# Patient Record
Sex: Male | Born: 1980
Health system: Southern US, Community
[De-identification: ages and names within clinical notes are randomized; demographics above are authoritative.]

## PROBLEM LIST (undated history)

## (undated) ENCOUNTER — Ambulatory Visit (HOSPITAL_COMMUNITY): Admission: EM | Payer: Medicare Other

## (undated) DIAGNOSIS — D571 Sickle-cell disease without crisis: Secondary | ICD-10-CM

## (undated) DIAGNOSIS — G43909 Migraine, unspecified, not intractable, without status migrainosus: Secondary | ICD-10-CM

## (undated) DIAGNOSIS — N058 Unspecified nephritic syndrome with other morphologic changes: Secondary | ICD-10-CM

## (undated) DIAGNOSIS — K3533 Acute appendicitis with perforation and localized peritonitis, with abscess: Secondary | ICD-10-CM

## (undated) DIAGNOSIS — R000 Tachycardia, unspecified: Secondary | ICD-10-CM

## (undated) DIAGNOSIS — J159 Unspecified bacterial pneumonia: Secondary | ICD-10-CM

## (undated) DIAGNOSIS — G930 Cerebral cysts: Secondary | ICD-10-CM

## (undated) DIAGNOSIS — K529 Noninfective gastroenteritis and colitis, unspecified: Secondary | ICD-10-CM

## (undated) DIAGNOSIS — N189 Chronic kidney disease, unspecified: Secondary | ICD-10-CM

## (undated) DIAGNOSIS — N289 Disorder of kidney and ureter, unspecified: Secondary | ICD-10-CM

## (undated) DIAGNOSIS — N08 Glomerular disorders in diseases classified elsewhere: Secondary | ICD-10-CM

## (undated) DIAGNOSIS — D5700 Hb-SS disease with crisis, unspecified: Secondary | ICD-10-CM

## (undated) DIAGNOSIS — N182 Chronic kidney disease, stage 2 (mild): Secondary | ICD-10-CM

## (undated) DIAGNOSIS — N62 Hypertrophy of breast: Secondary | ICD-10-CM

## (undated) DIAGNOSIS — K219 Gastro-esophageal reflux disease without esophagitis: Secondary | ICD-10-CM

## (undated) DIAGNOSIS — Z9289 Personal history of other medical treatment: Secondary | ICD-10-CM

## (undated) DIAGNOSIS — F1123 Opioid dependence with withdrawal: Secondary | ICD-10-CM

## (undated) HISTORY — PX: CHOLECYSTECTOMY: SHX55

## (undated) HISTORY — DX: Unspecified bacterial pneumonia: J15.9

## (undated) HISTORY — DX: Sickle-cell disease without crisis: D57.1

## (undated) HISTORY — PX: ABCESS DRAINAGE: SHX399

## (undated) HISTORY — PX: OTHER SURGICAL HISTORY: SHX169

---
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
## 2002-03-13 ENCOUNTER — Encounter: Admission: RE | Admit: 2002-03-13 | Discharge: 2002-03-13 | Payer: Self-pay | Admitting: Family Medicine

## 2002-03-15 ENCOUNTER — Inpatient Hospital Stay (HOSPITAL_COMMUNITY): Admission: EM | Admit: 2002-03-15 | Discharge: 2002-03-19 | Payer: Self-pay | Admitting: Emergency Medicine

## 2002-03-15 ENCOUNTER — Encounter: Payer: Self-pay | Admitting: Emergency Medicine

## 2002-03-17 ENCOUNTER — Encounter: Payer: Self-pay | Admitting: Family Medicine

## 2002-03-25 ENCOUNTER — Encounter: Admission: RE | Admit: 2002-03-25 | Discharge: 2002-03-25 | Payer: Self-pay | Admitting: Family Medicine

## 2002-08-15 ENCOUNTER — Encounter: Admission: RE | Admit: 2002-08-15 | Discharge: 2002-08-15 | Payer: Self-pay | Admitting: Family Medicine

## 2002-09-14 ENCOUNTER — Inpatient Hospital Stay (HOSPITAL_COMMUNITY): Admission: EM | Admit: 2002-09-14 | Discharge: 2002-09-16 | Payer: Self-pay | Admitting: Emergency Medicine

## 2003-01-14 ENCOUNTER — Inpatient Hospital Stay (HOSPITAL_COMMUNITY): Admission: EM | Admit: 2003-01-14 | Discharge: 2003-01-19 | Payer: Self-pay | Admitting: Emergency Medicine

## 2003-03-10 ENCOUNTER — Observation Stay (HOSPITAL_COMMUNITY): Admission: EM | Admit: 2003-03-10 | Discharge: 2003-03-11 | Payer: Self-pay | Admitting: Emergency Medicine

## 2003-03-14 ENCOUNTER — Inpatient Hospital Stay (HOSPITAL_COMMUNITY): Admission: EM | Admit: 2003-03-14 | Discharge: 2003-03-16 | Payer: Self-pay

## 2003-04-06 ENCOUNTER — Encounter: Admission: RE | Admit: 2003-04-06 | Discharge: 2003-04-06 | Payer: Self-pay | Admitting: Family Medicine

## 2003-07-17 ENCOUNTER — Encounter: Admission: RE | Admit: 2003-07-17 | Discharge: 2003-07-17 | Payer: Self-pay | Admitting: Sports Medicine

## 2003-07-20 ENCOUNTER — Encounter: Admission: RE | Admit: 2003-07-20 | Discharge: 2003-07-20 | Payer: Self-pay | Admitting: Sports Medicine

## 2003-07-24 ENCOUNTER — Emergency Department (HOSPITAL_COMMUNITY): Admission: EM | Admit: 2003-07-24 | Discharge: 2003-07-24 | Payer: Self-pay | Admitting: Emergency Medicine

## 2003-07-24 ENCOUNTER — Encounter: Admission: RE | Admit: 2003-07-24 | Discharge: 2003-07-24 | Payer: Self-pay | Admitting: Sports Medicine

## 2003-08-11 ENCOUNTER — Encounter: Admission: RE | Admit: 2003-08-11 | Discharge: 2003-08-11 | Payer: Self-pay | Admitting: Family Medicine

## 2003-10-27 ENCOUNTER — Ambulatory Visit: Payer: Self-pay | Admitting: Family Medicine

## 2003-10-30 ENCOUNTER — Ambulatory Visit: Payer: Self-pay | Admitting: Family Medicine

## 2004-01-18 ENCOUNTER — Emergency Department (HOSPITAL_COMMUNITY): Admission: EM | Admit: 2004-01-18 | Discharge: 2004-01-19 | Payer: Self-pay | Admitting: Emergency Medicine

## 2004-01-20 ENCOUNTER — Ambulatory Visit: Payer: Self-pay | Admitting: Family Medicine

## 2004-01-20 ENCOUNTER — Inpatient Hospital Stay (HOSPITAL_COMMUNITY): Admission: EM | Admit: 2004-01-20 | Discharge: 2004-01-23 | Payer: Self-pay | Admitting: Emergency Medicine

## 2004-03-03 ENCOUNTER — Ambulatory Visit: Payer: Self-pay | Admitting: Family Medicine

## 2004-03-03 ENCOUNTER — Emergency Department (HOSPITAL_COMMUNITY): Admission: EM | Admit: 2004-03-03 | Discharge: 2004-03-03 | Payer: Self-pay | Admitting: Physical Therapy

## 2004-05-06 ENCOUNTER — Ambulatory Visit: Payer: Self-pay | Admitting: Sports Medicine

## 2004-05-11 ENCOUNTER — Ambulatory Visit: Payer: Self-pay | Admitting: Family Medicine

## 2004-05-11 ENCOUNTER — Emergency Department (HOSPITAL_COMMUNITY): Admission: EM | Admit: 2004-05-11 | Discharge: 2004-05-11 | Payer: Self-pay | Admitting: Emergency Medicine

## 2004-05-20 ENCOUNTER — Encounter: Admission: RE | Admit: 2004-05-20 | Discharge: 2004-05-20 | Payer: Self-pay | Admitting: Sports Medicine

## 2004-05-20 ENCOUNTER — Ambulatory Visit: Payer: Self-pay | Admitting: Sports Medicine

## 2004-06-08 ENCOUNTER — Ambulatory Visit: Payer: Self-pay | Admitting: Family Medicine

## 2004-08-15 ENCOUNTER — Ambulatory Visit: Payer: Self-pay | Admitting: Family Medicine

## 2004-09-20 ENCOUNTER — Ambulatory Visit: Payer: Self-pay | Admitting: Family Medicine

## 2004-09-27 ENCOUNTER — Ambulatory Visit: Payer: Self-pay | Admitting: Family Medicine

## 2004-09-27 ENCOUNTER — Inpatient Hospital Stay (HOSPITAL_COMMUNITY): Admission: AD | Admit: 2004-09-27 | Discharge: 2004-09-29 | Payer: Self-pay | Admitting: Family Medicine

## 2004-10-02 ENCOUNTER — Emergency Department (HOSPITAL_COMMUNITY): Admission: EM | Admit: 2004-10-02 | Discharge: 2004-10-02 | Payer: Self-pay | Admitting: Family Medicine

## 2004-10-13 ENCOUNTER — Ambulatory Visit: Payer: Self-pay | Admitting: Family Medicine

## 2004-12-24 IMAGING — US US ABDOMEN COMPLETE
1 series · 14 of 25 positions shown · non-contrast
Comparison: none

CLINICAL DATA: Right upper quadrant pain.  Sickle cell.
ABDOMINAL ULTRASOUND, COMPLETE 
Comparison 05/24/00.  There are multiple echogenic, movable foci within the gallbladder lumen.  Most of these do not shadow and are probably due to tumefactive sludge.  There are a few shadowing foci as well compatible with gallstones.  There is no gallbladder wall thickening. No pericholecystic fluid is present, and the patient is nontender on imaging the gallbladder.
Views of the liver, inferior vena cava, and kidneys are unremarkable.  The right kidney measures 10.9 cm in length and the left kidney is 9.8 cm.  The pancreas and spleen are poorly seen due to intestinal bowel gas.  There is somewhat limited evaluation of the left kidney as well.  The abdominal aorta does not appear dilated.  There is no ascites. 
IMPRESSION
1.  As noted previously, there is gallbladder sludge, and small gallstones are present on the current exam.  o evidence of cholecystitis.
2.  No other abnormalities are demonstrated.  The spleen and pancreas are poorly seen secondary to bowel gas.

[Series 1: unknown · 0.27mm/px · 14 of 84 slices shown]
[im 1/84]
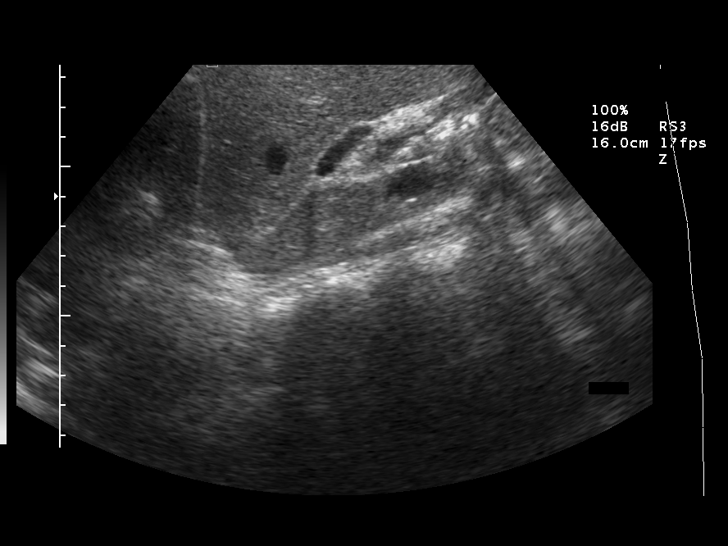
[im 7/84]
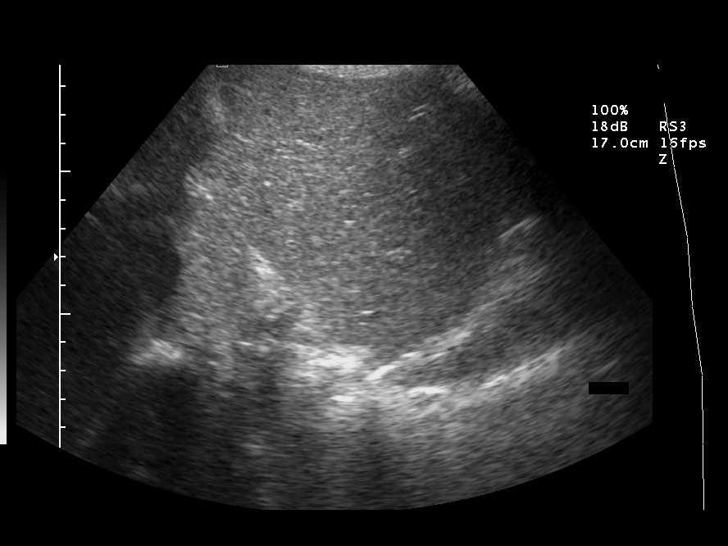
[im 14/84]
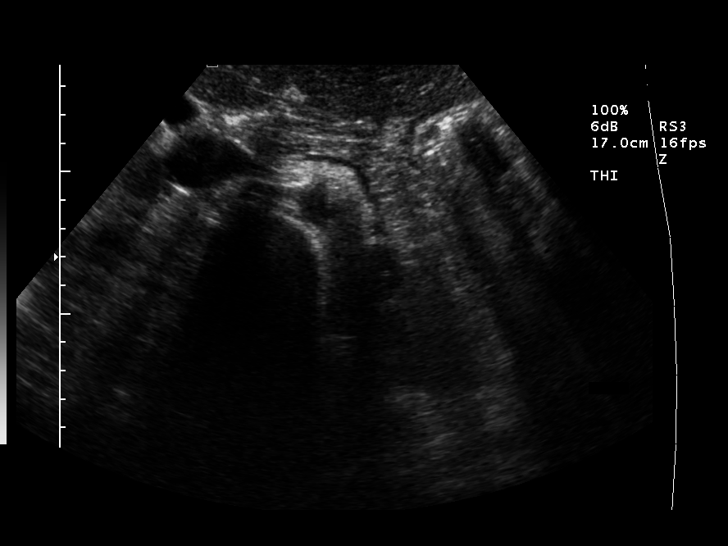
[im 21/84]
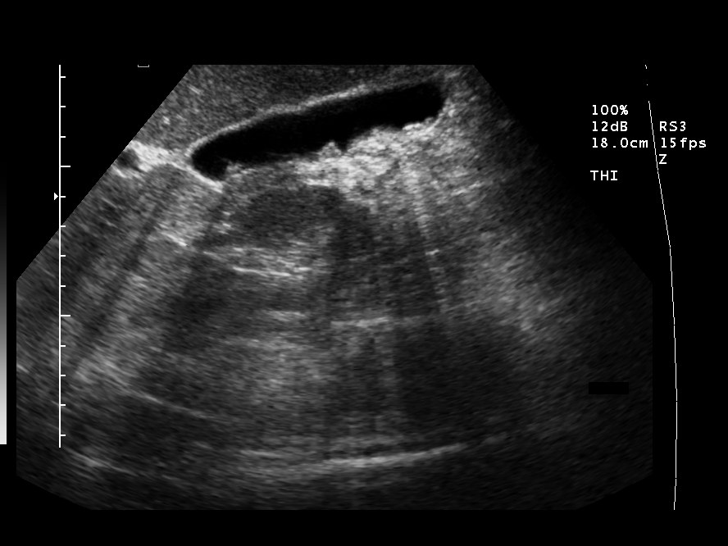
[im 28/84]
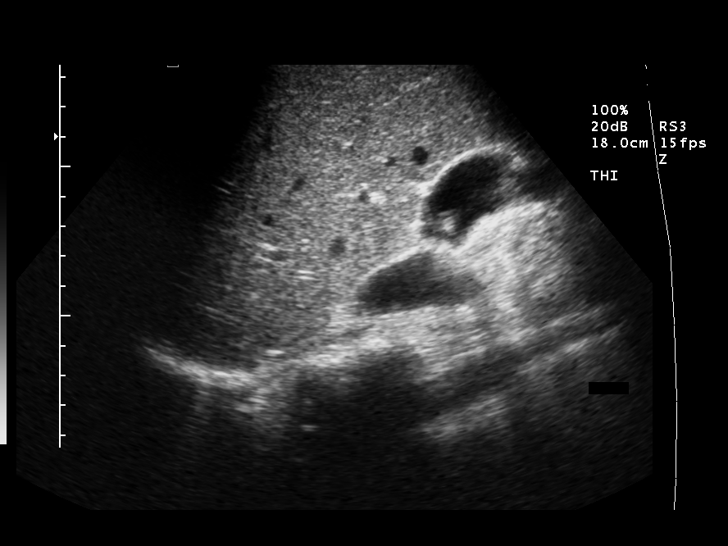
[im 32/84]
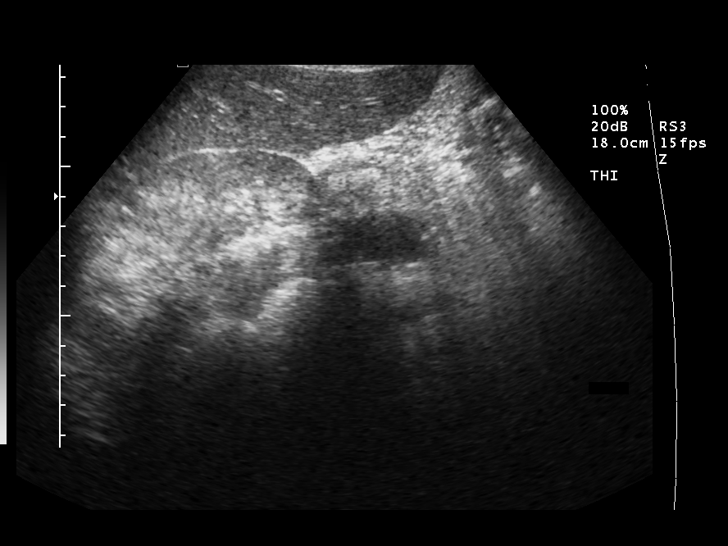
[im 39/84]
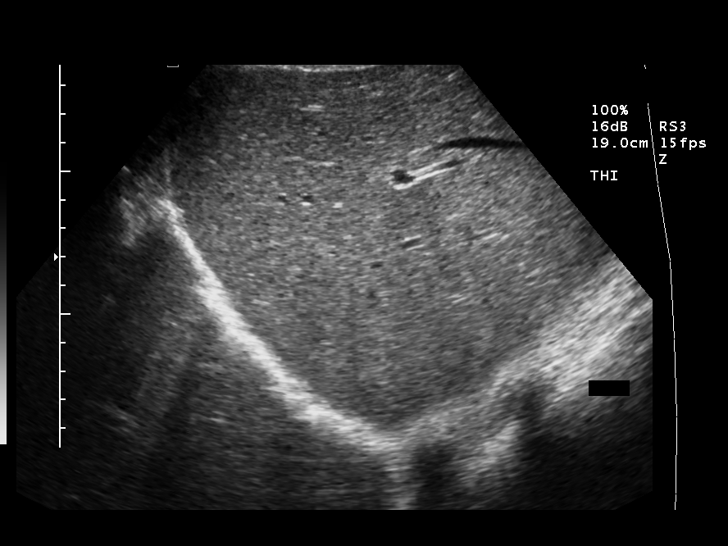
[im 45/84]
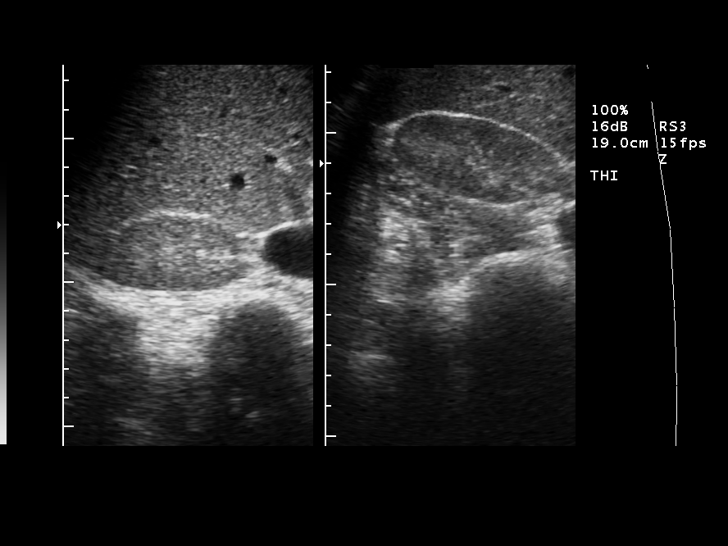
[im 52/84]
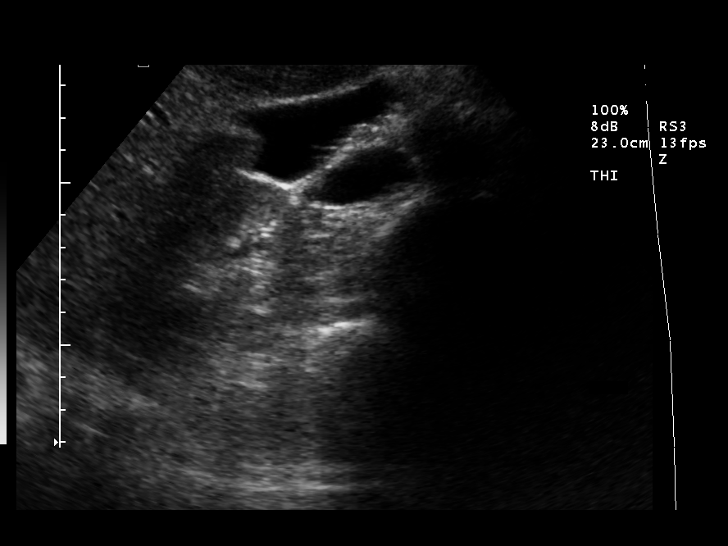
[im 56/84]
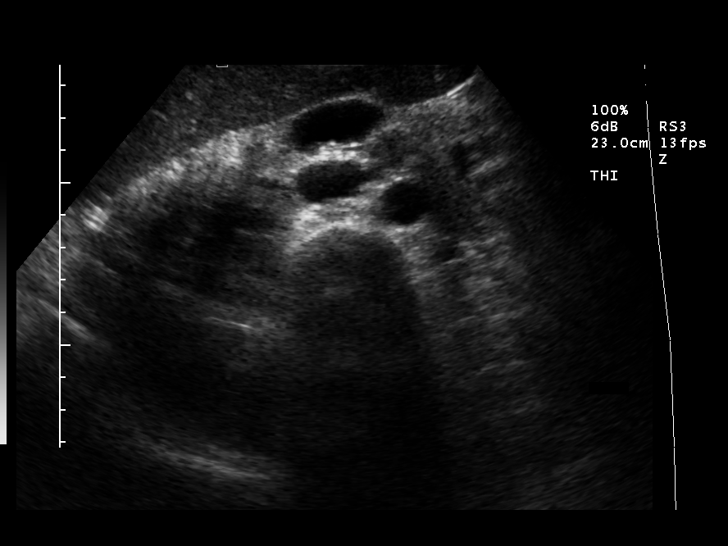
[im 63/84]
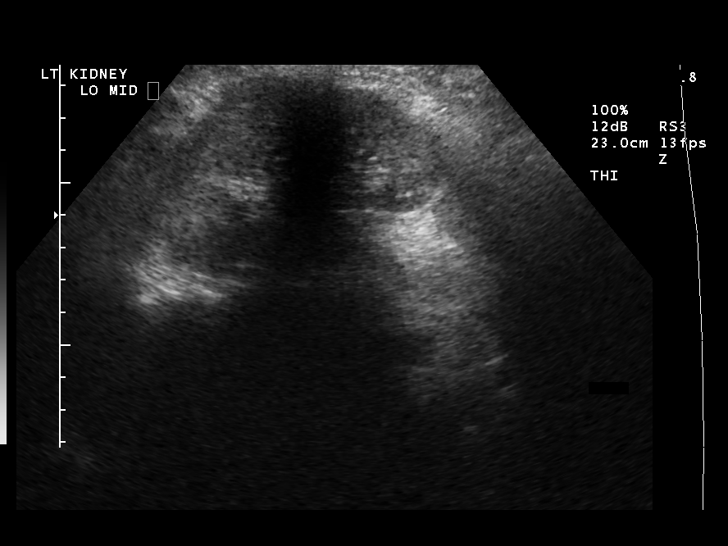
[im 70/84]
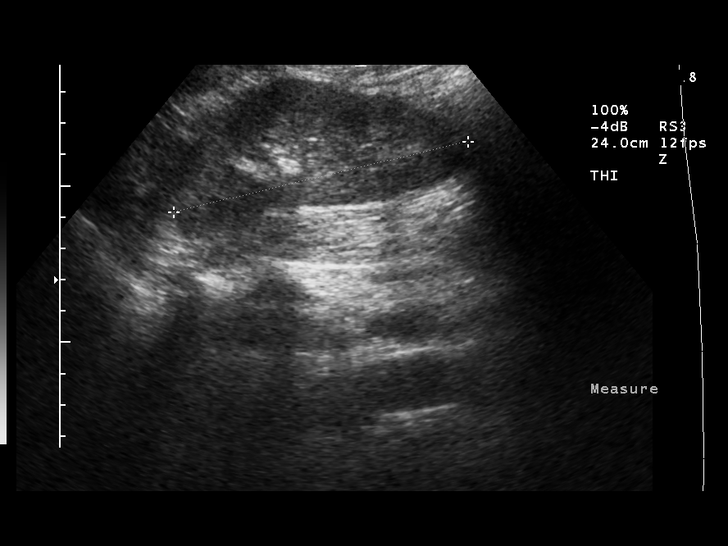
[im 77/84]
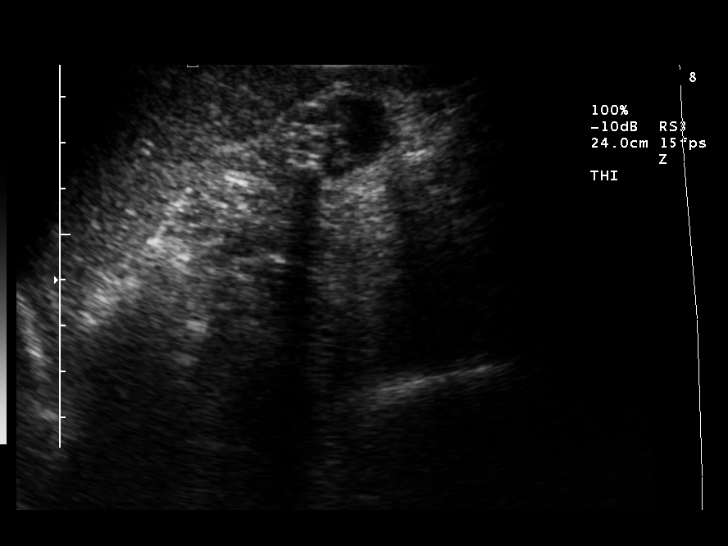
[im 84/84]
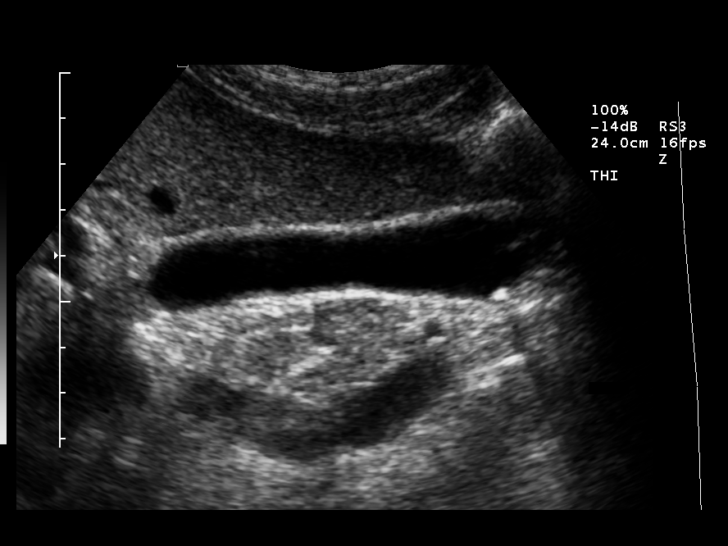

[14 of 25 positions shown; findings below may reference images not displayed]

## 2004-12-31 ENCOUNTER — Emergency Department (HOSPITAL_COMMUNITY): Admission: EM | Admit: 2004-12-31 | Discharge: 2004-12-31 | Payer: Self-pay | Admitting: Family Medicine

## 2005-01-16 ENCOUNTER — Ambulatory Visit: Payer: Self-pay | Admitting: Family Medicine

## 2005-03-03 ENCOUNTER — Ambulatory Visit: Payer: Self-pay | Admitting: Family Medicine

## 2005-06-25 IMAGING — CR DG CHEST 2V
2 series · 2 of 2 positions shown · non-contrast
Comparison: none

CLINICAL DATA: Sickle cell disease.  Dyspnea and chest pain. 
 CHEST - TWO VIEW:
 Cardiomegaly and increased vascularity noted.  Diffuse peribronchial thickening and interstitial opacities are likely chronic.  Mild airspace disease in the left lower lung noted and pneumonia not excluded.  Bony evidence of sickle cell disease identified.

[view not recorded (1 of 2)]
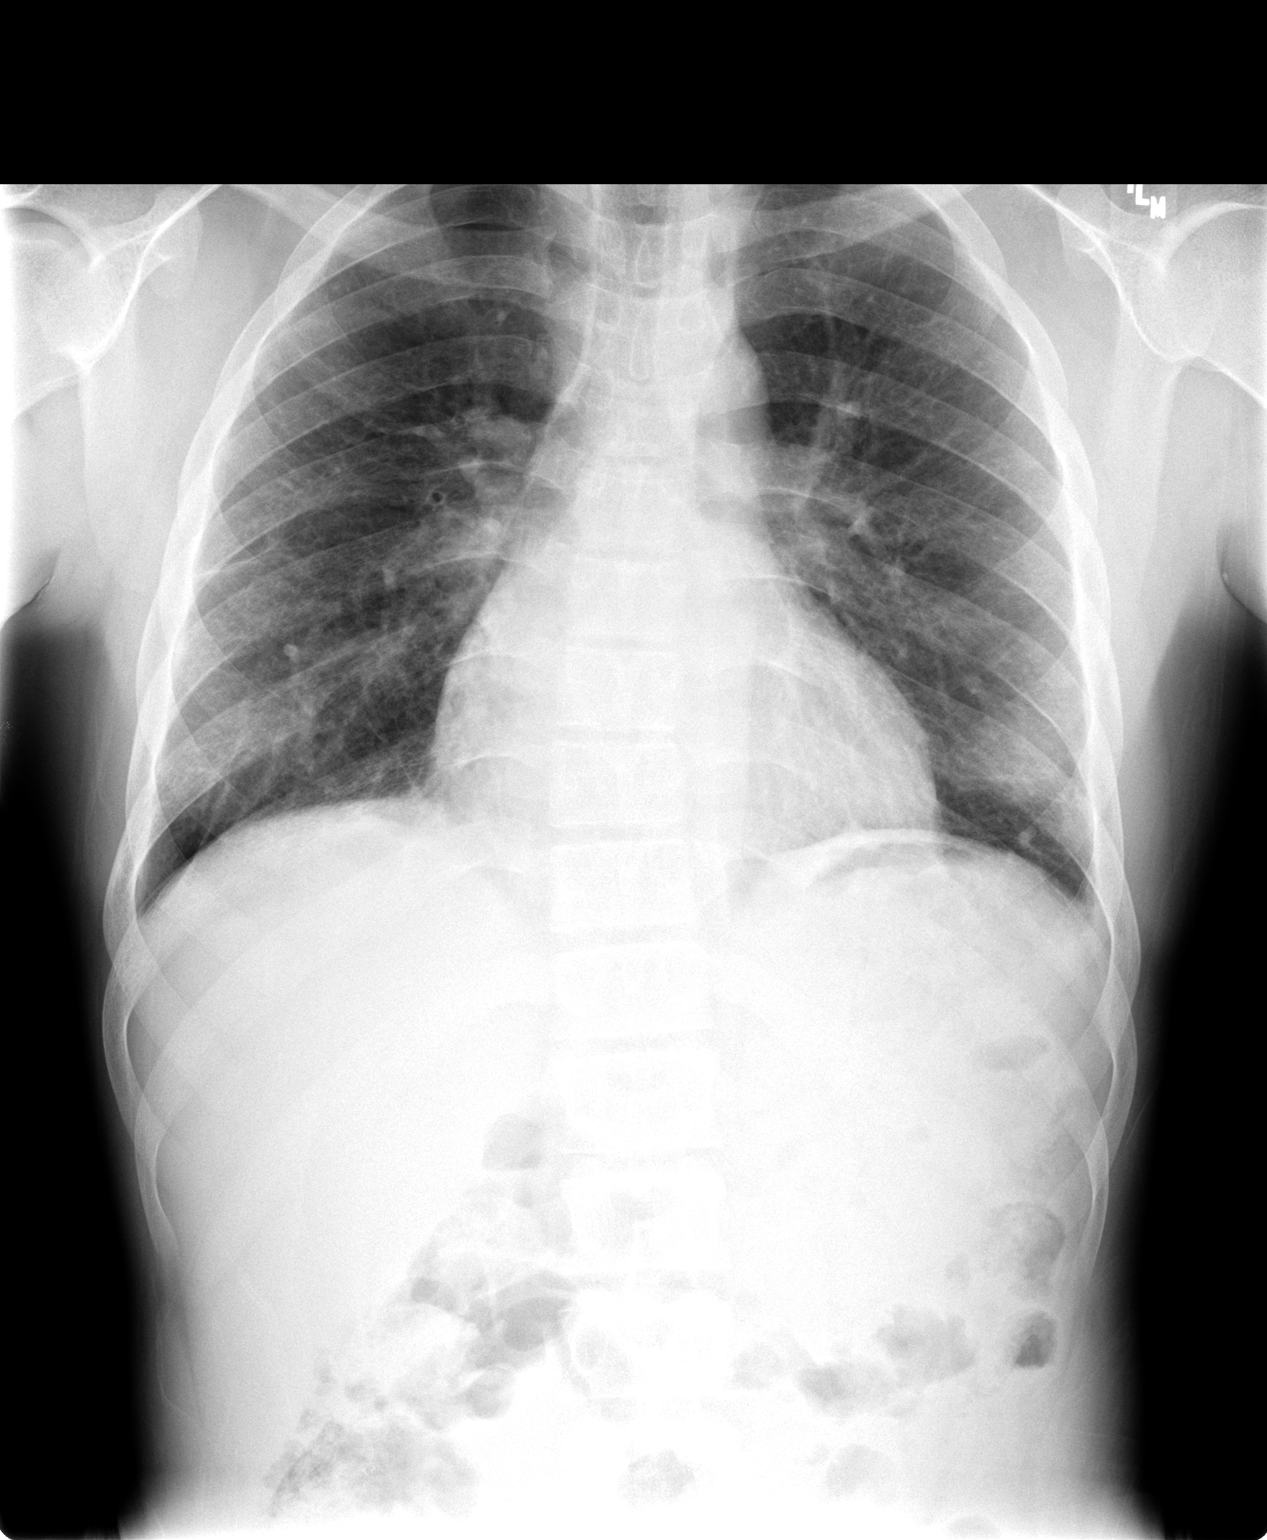

[view not recorded (2 of 2)]
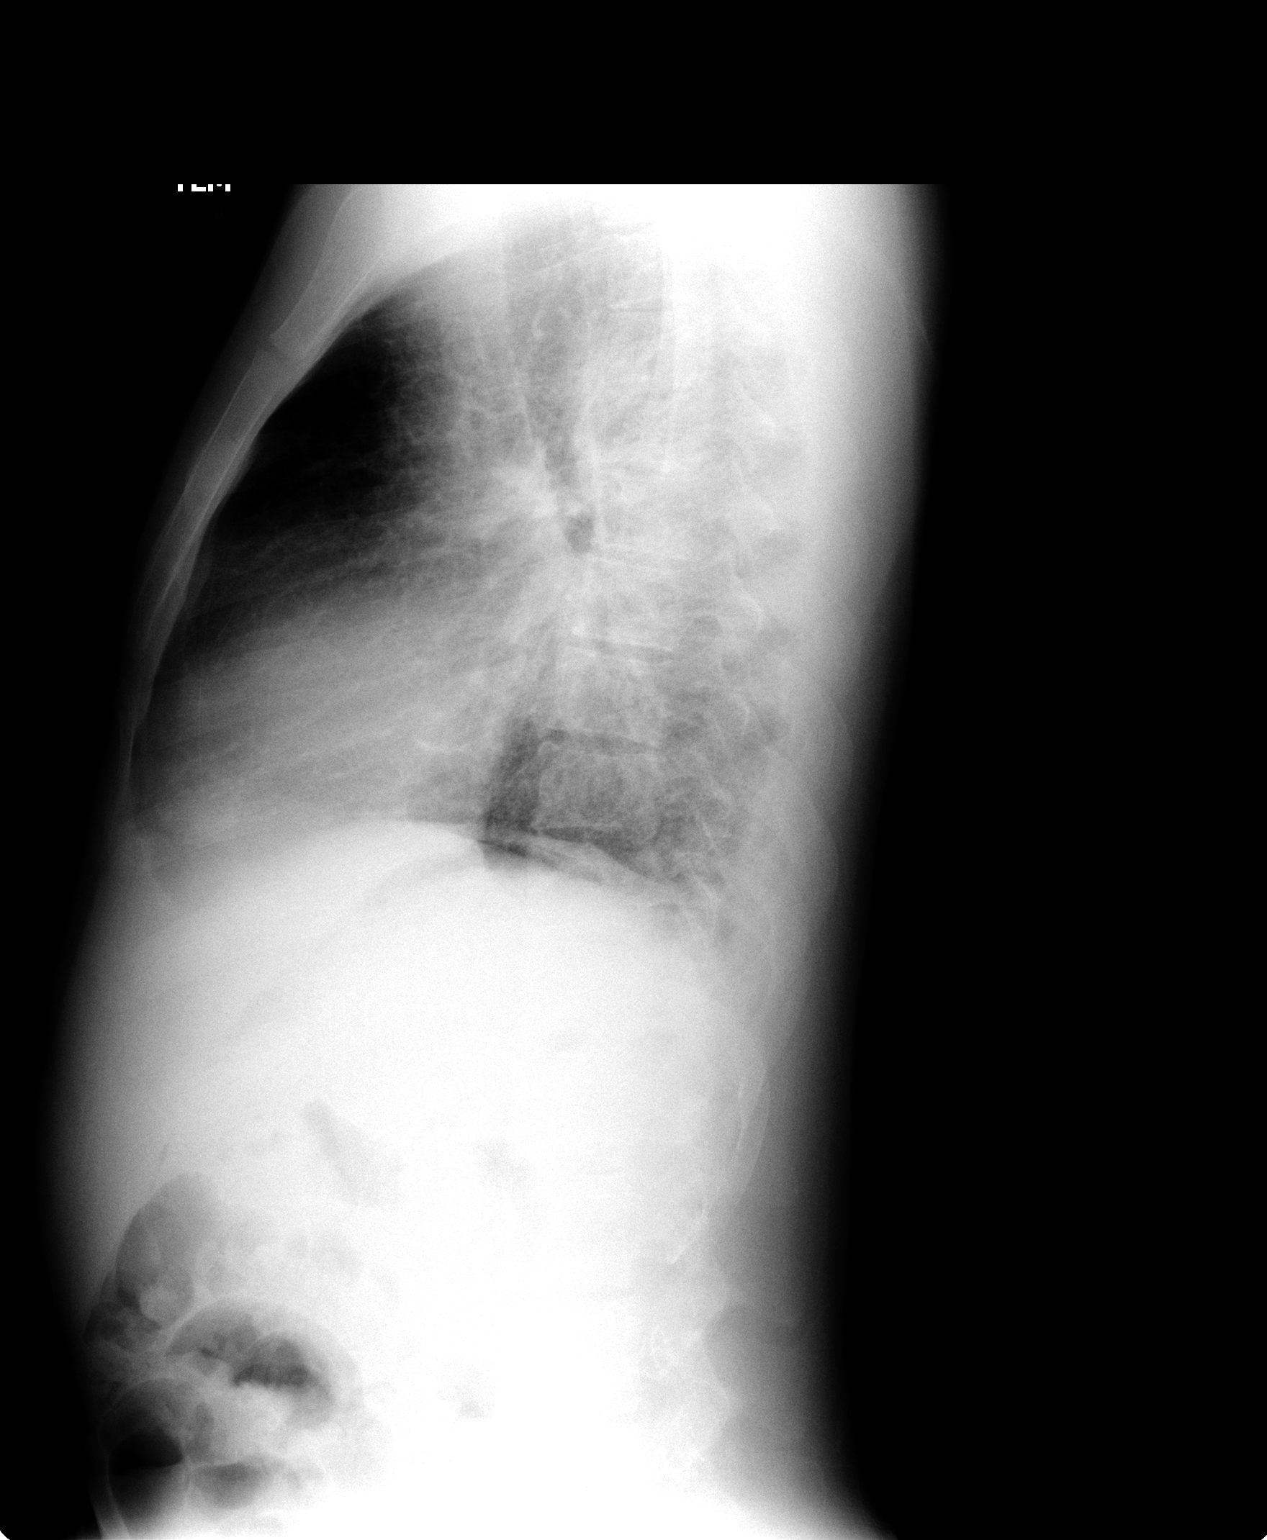

[2 of 2 positions shown; findings below may reference images not displayed]

IMPRESSION: 1.  Cardiomegaly and increased pulmonary vascularity.  Chronic interstitial changes. 
 2.  Question mild airspace disease/pneumonia in the left lower lung.

## 2005-06-26 ENCOUNTER — Ambulatory Visit: Payer: Self-pay | Admitting: Family Medicine

## 2005-06-27 IMAGING — CR DG CHEST 2V
2 series · 2 of 2 positions shown · non-contrast
Comparison: none

CLINICAL DATA: Sickle cell crisis, chest pain, worse with deep inspiration.
 CHEST, TWO VIEWS:
 PA and lateral views of the chest are made and are compared to previous studies of 01/19/04 and show worsening of the basilar atelectasis and/or infiltrates bilaterally.  The changes are more marked on the left than the right.  There is a generalized peribronchial thickening, and the cardiopericardial silhouette is somewhat prominent as would be expected with the patient?s sickle cell anemia.

[view not recorded (1 of 2)]
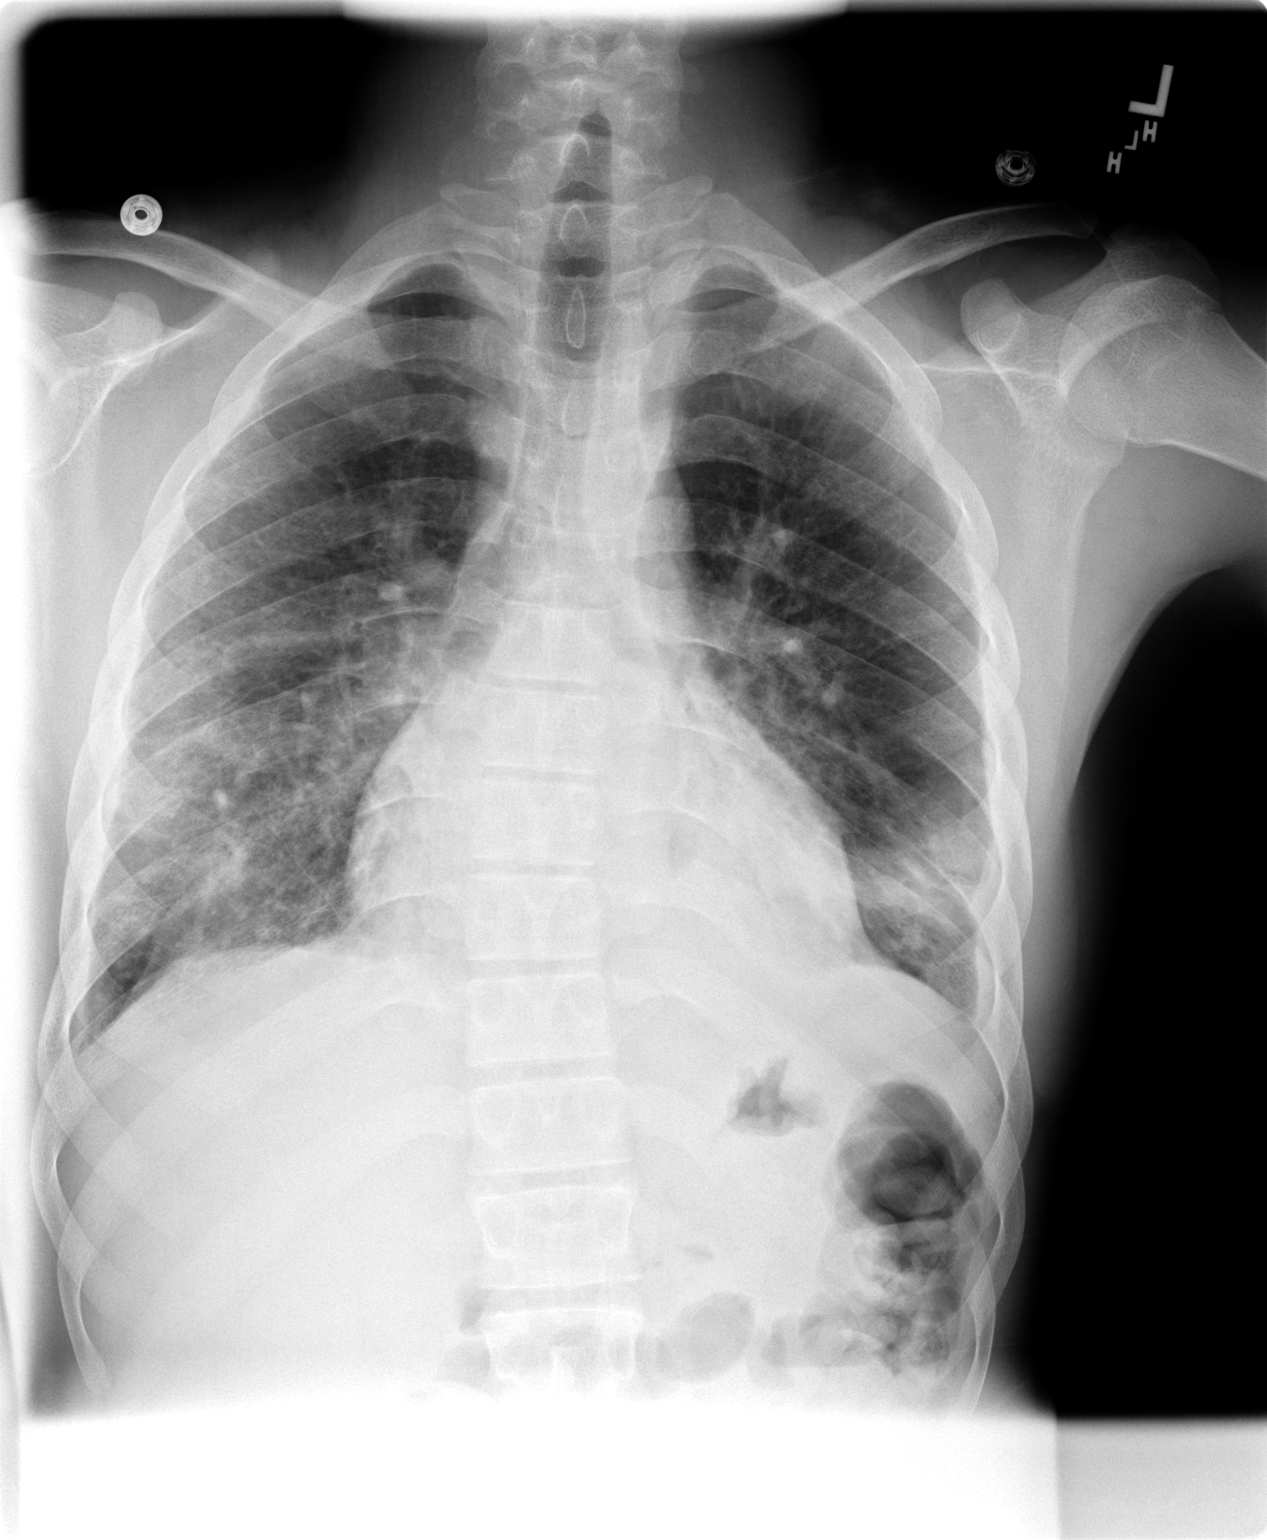

[view not recorded (2 of 2)]
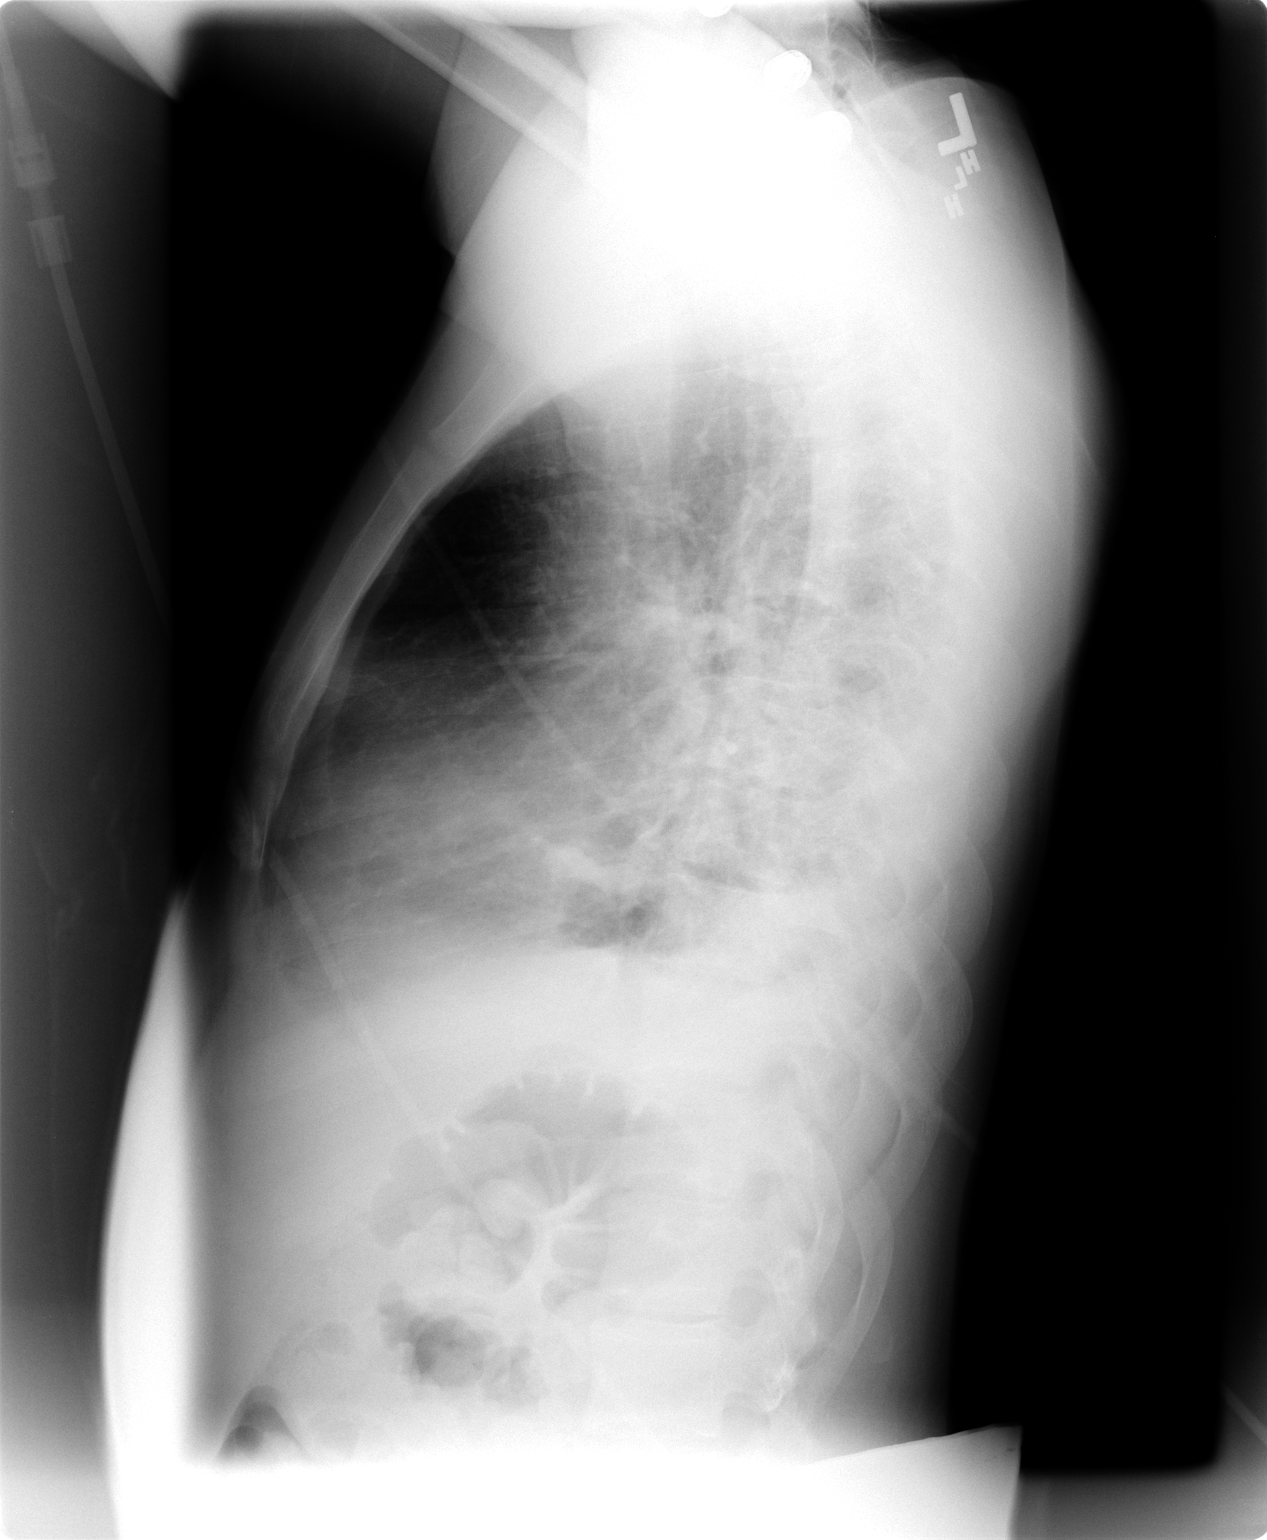

[2 of 2 positions shown; findings below may reference images not displayed]

IMPRESSION: Interval worsening of basilar aeration.  Cannot rule out the possibility of an early infiltrate at the left base.  There also is now a small amount of left pleural effusion that has developed in the interval.

## 2005-08-29 ENCOUNTER — Ambulatory Visit: Payer: Self-pay | Admitting: Family Medicine

## 2005-09-16 ENCOUNTER — Ambulatory Visit: Payer: Self-pay | Admitting: Family Medicine

## 2005-09-16 ENCOUNTER — Inpatient Hospital Stay (HOSPITAL_COMMUNITY): Admission: AD | Admit: 2005-09-16 | Discharge: 2005-09-18 | Payer: Self-pay | Admitting: Family Medicine

## 2005-09-16 ENCOUNTER — Emergency Department (HOSPITAL_COMMUNITY): Admission: EM | Admit: 2005-09-16 | Discharge: 2005-09-16 | Payer: Self-pay | Admitting: Family Medicine

## 2005-10-25 IMAGING — CR DG CHEST 2V
2 series · 2 of 2 positions shown · non-contrast
Comparison: none

CLINICAL DATA: Right sided chest pain.  Short of breath. 
 CHEST X-RAY: 
 Two views of the chest are compared to a chest x-ray of 01/21/04.  There is a patchy opacity peripherally at the left lung base.  This could represent scarring at the site of prior pneumonia, but a small patchy area of pneumonia currently is difficult to exclude.  Follow up chest x-ray is recommended.  Changes of bronchitis are noted.  The heart is mildly enlarged and stable.

[view not recorded (1 of 2)]
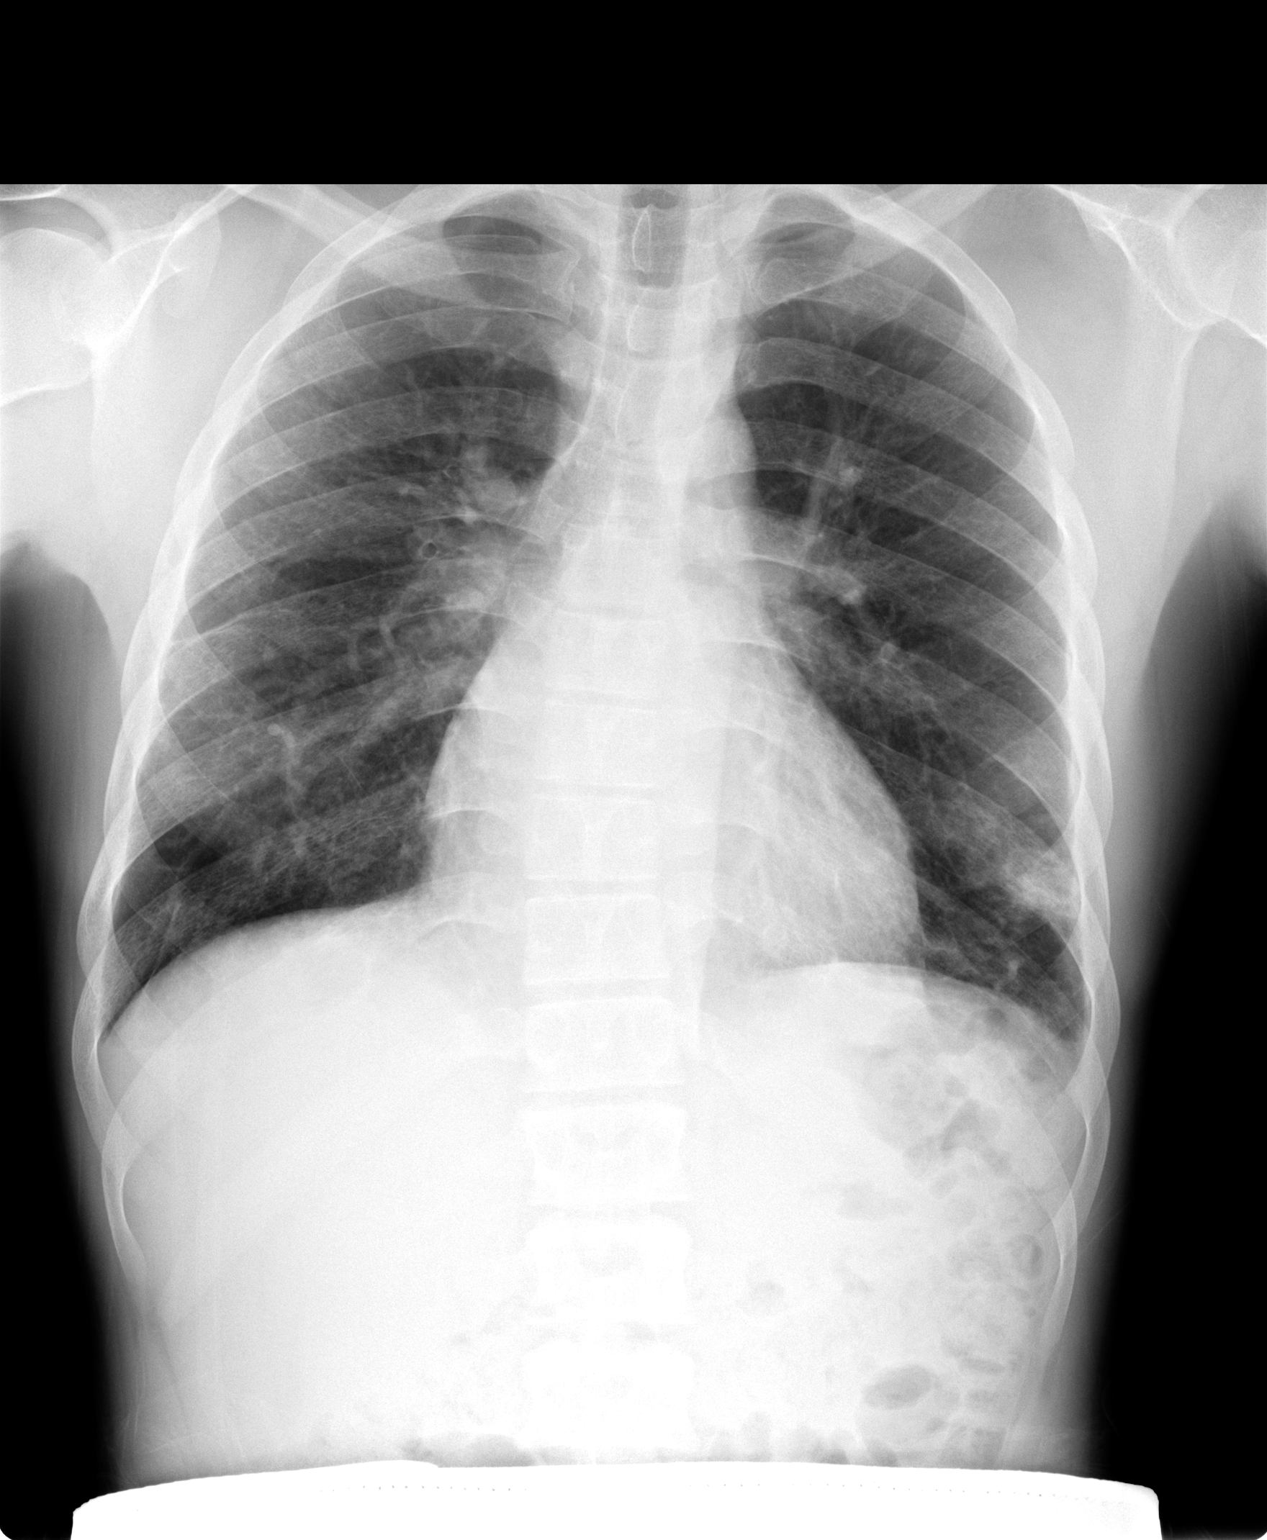

[view not recorded (2 of 2)]
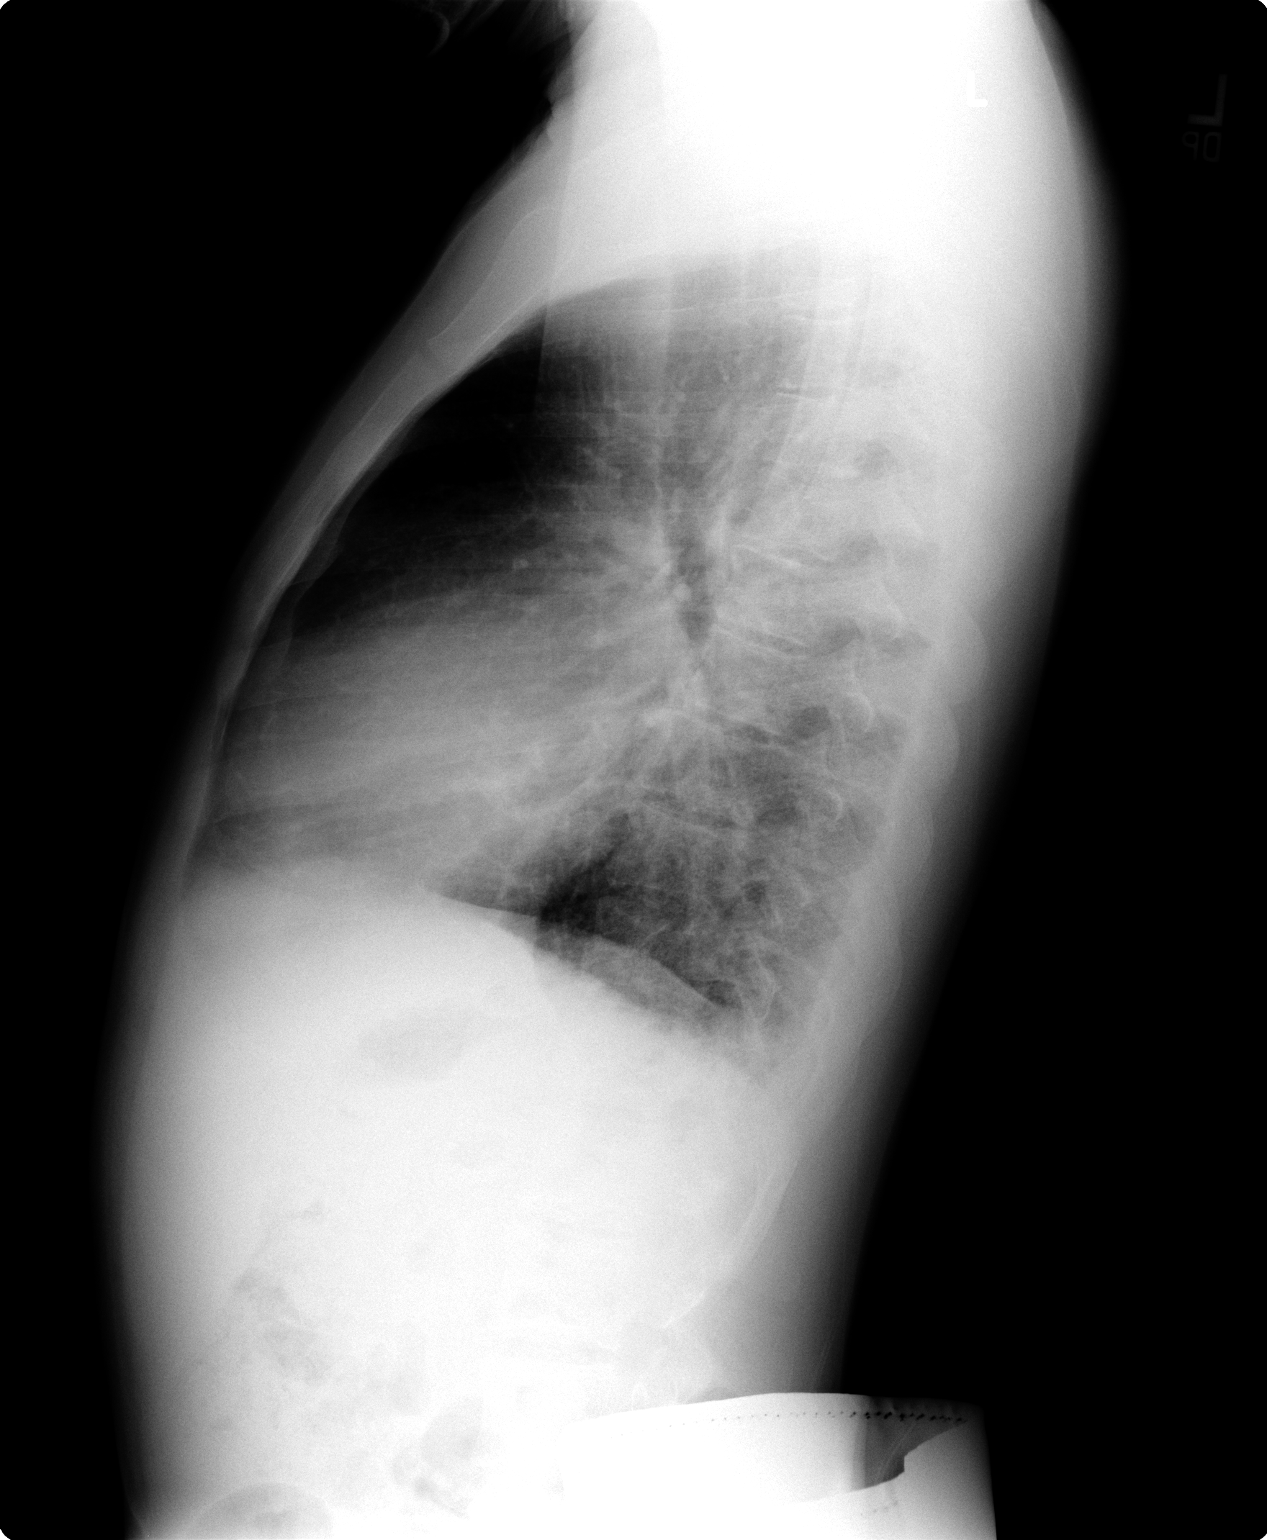

[2 of 2 positions shown; findings below may reference images not displayed]

IMPRESSION: 1.  Cannot exclude small patchy area of pneumonia at the periphery of the left lung base vs scarring.  Suggest follow up. 
 2.  Changes of bronchitis.

## 2005-11-08 ENCOUNTER — Ambulatory Visit: Payer: Self-pay | Admitting: Family Medicine

## 2005-11-09 ENCOUNTER — Ambulatory Visit: Payer: Self-pay | Admitting: Family Medicine

## 2005-11-09 ENCOUNTER — Inpatient Hospital Stay (HOSPITAL_COMMUNITY): Admission: EM | Admit: 2005-11-09 | Discharge: 2005-11-14 | Payer: Self-pay | Admitting: Emergency Medicine

## 2005-11-10 ENCOUNTER — Ambulatory Visit: Payer: Self-pay | Admitting: Oncology

## 2005-11-23 ENCOUNTER — Ambulatory Visit: Payer: Self-pay | Admitting: Family Medicine

## 2005-11-24 ENCOUNTER — Ambulatory Visit: Payer: Self-pay | Admitting: Family Medicine

## 2006-02-13 ENCOUNTER — Inpatient Hospital Stay (HOSPITAL_COMMUNITY): Admission: EM | Admit: 2006-02-13 | Discharge: 2006-02-14 | Payer: Self-pay | Admitting: Emergency Medicine

## 2006-02-13 ENCOUNTER — Ambulatory Visit: Payer: Self-pay | Admitting: Family Medicine

## 2006-03-04 IMAGING — CR DG CHEST 2V
2 series · 2 of 2 positions shown · non-contrast
Comparison: 01/19/04 and 05/20/04.

CLINICAL DATA: Chest pain and shortness of breath.
 CHEST - 2 VIEW: 
 PA and lateral chest ? 09/27/04.

[w chest pa]
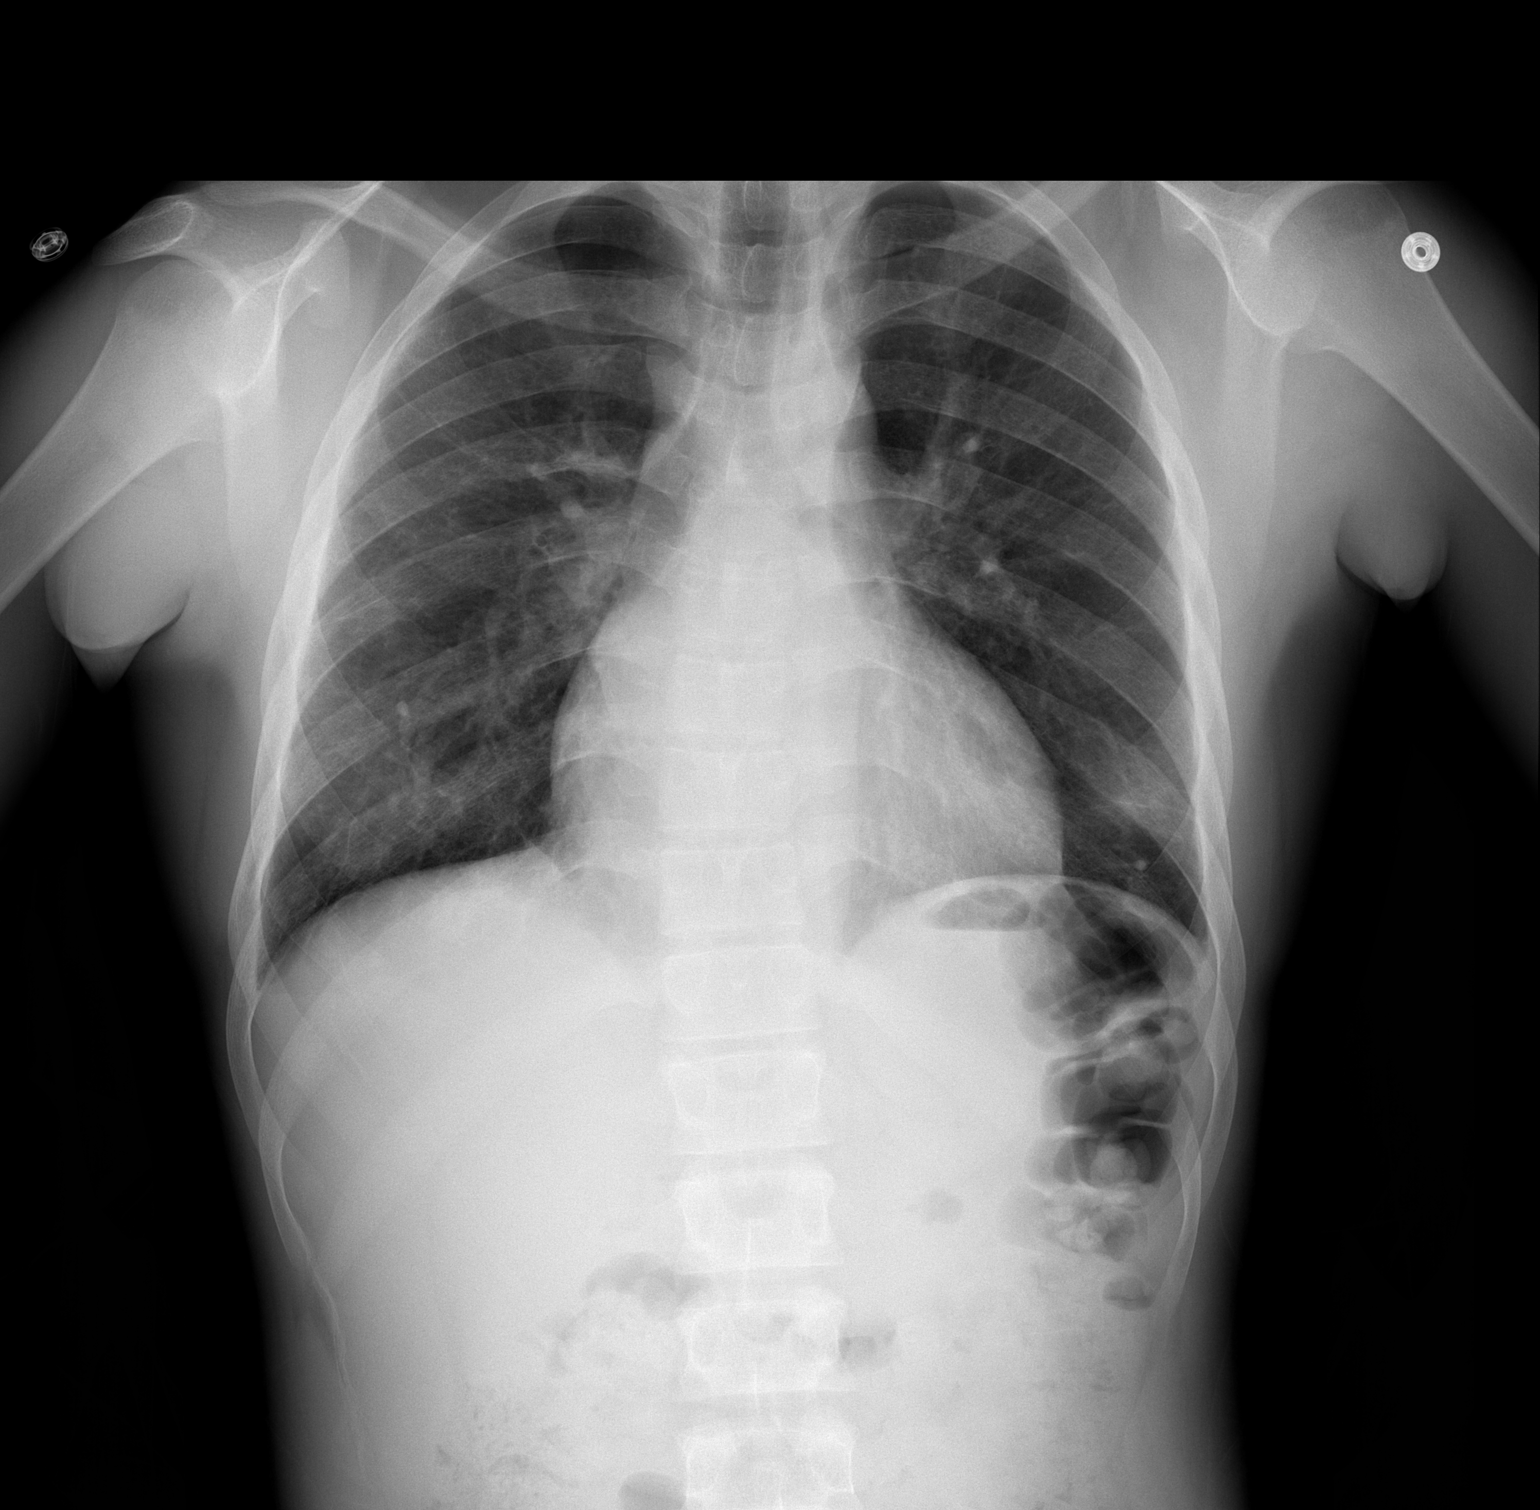

[w chest lat]
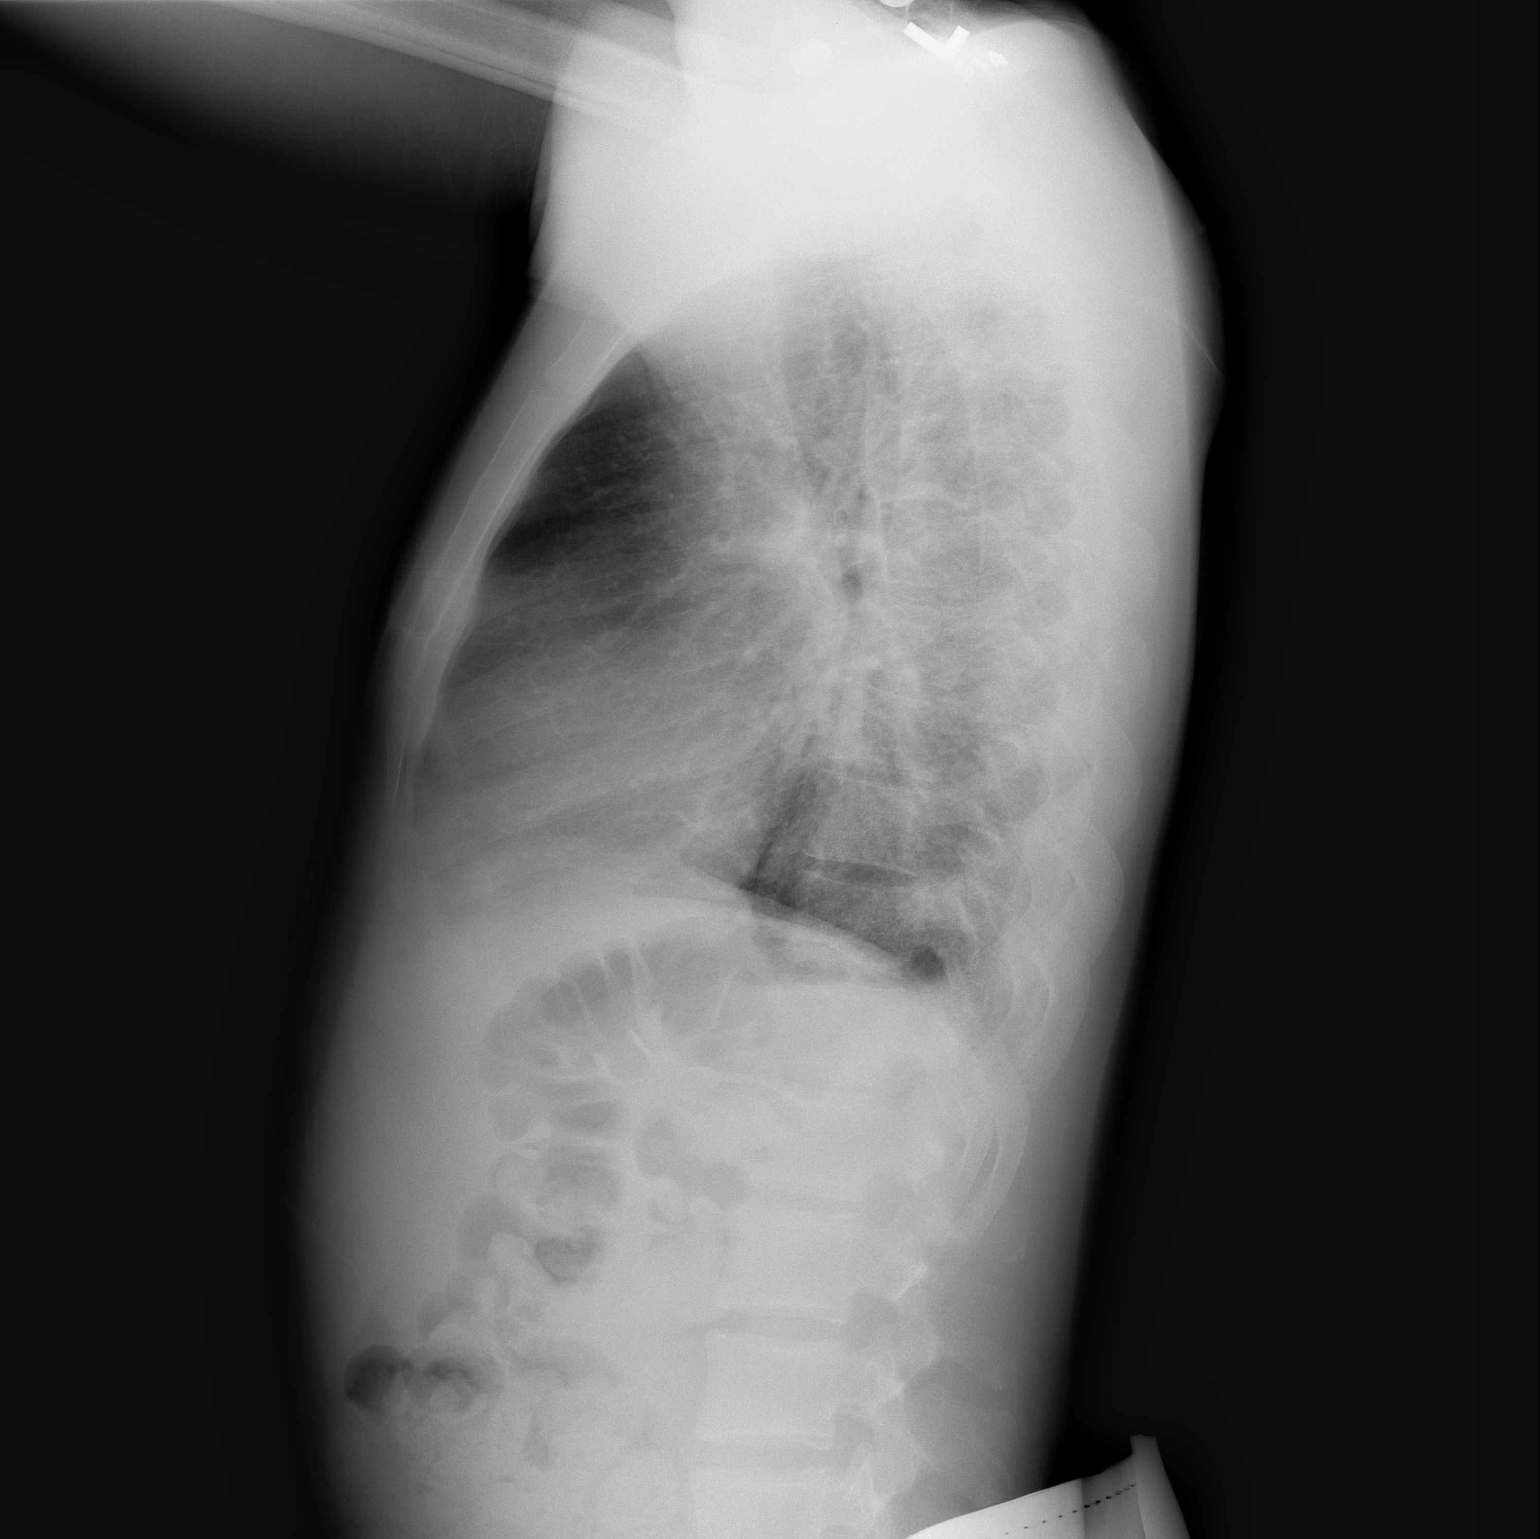

[2 of 2 positions shown; findings below may reference images not displayed]

FINDINGS: There is some linear scarring present in the left lung base.  There is prominence of the pulmonary interstitium and mild enlargement of the cardiac silhouette consistent with the patient?s history of sickle cell disease.  No new focal air space disease.  No effusion.  No focal bony abnormality.
IMPRESSION: 1.  No acute process.
 2.  Left base scar.

## 2006-03-23 ENCOUNTER — Ambulatory Visit: Payer: Self-pay | Admitting: Family Medicine

## 2006-05-15 ENCOUNTER — Encounter: Payer: Self-pay | Admitting: *Deleted

## 2006-06-06 ENCOUNTER — Telehealth: Payer: Self-pay | Admitting: *Deleted

## 2006-06-22 ENCOUNTER — Inpatient Hospital Stay (HOSPITAL_COMMUNITY): Admission: EM | Admit: 2006-06-22 | Discharge: 2006-06-22 | Payer: Self-pay | Admitting: Emergency Medicine

## 2006-06-22 ENCOUNTER — Ambulatory Visit: Payer: Self-pay | Admitting: Family Medicine

## 2006-06-22 ENCOUNTER — Encounter: Payer: Self-pay | Admitting: Family Medicine

## 2006-07-12 ENCOUNTER — Encounter: Payer: Self-pay | Admitting: Family Medicine

## 2006-09-04 ENCOUNTER — Telehealth: Payer: Self-pay | Admitting: Family Medicine

## 2006-09-04 ENCOUNTER — Encounter: Payer: Self-pay | Admitting: *Deleted

## 2006-09-04 ENCOUNTER — Emergency Department (HOSPITAL_COMMUNITY): Admission: EM | Admit: 2006-09-04 | Discharge: 2006-09-04 | Payer: Self-pay | Admitting: Emergency Medicine

## 2006-09-12 ENCOUNTER — Ambulatory Visit: Payer: Self-pay | Admitting: Family Medicine

## 2006-10-23 ENCOUNTER — Ambulatory Visit: Payer: Self-pay | Admitting: Family Medicine

## 2006-10-23 ENCOUNTER — Encounter: Payer: Self-pay | Admitting: Family Medicine

## 2006-10-23 ENCOUNTER — Inpatient Hospital Stay (HOSPITAL_COMMUNITY): Admission: EM | Admit: 2006-10-23 | Discharge: 2006-10-25 | Payer: Self-pay | Admitting: Emergency Medicine

## 2006-11-15 ENCOUNTER — Ambulatory Visit: Payer: Self-pay | Admitting: Family Medicine

## 2006-11-15 ENCOUNTER — Encounter (INDEPENDENT_AMBULATORY_CARE_PROVIDER_SITE_OTHER): Payer: Self-pay | Admitting: *Deleted

## 2006-11-15 ENCOUNTER — Telehealth (INDEPENDENT_AMBULATORY_CARE_PROVIDER_SITE_OTHER): Payer: Self-pay | Admitting: *Deleted

## 2006-11-15 LAB — CONVERTED CEMR LAB
Basophils Absolute: 0 10*3/uL (ref 0.0–0.1)
Eosinophils Relative: 2 % (ref 0–5)
H Pylori IgG: NEGATIVE
HCT: 26.5 % — ABNORMAL LOW (ref 39.0–52.0)
Hemoglobin: 9.5 g/dL — ABNORMAL LOW (ref 13.0–17.0)
Lymphocytes Relative: 46 % (ref 12–46)
Monocytes Absolute: 0.6 10*3/uL (ref 0.2–0.7)
Monocytes Relative: 6 % (ref 3–11)
RDW: 23.8 % — ABNORMAL HIGH (ref 11.5–14.0)

## 2006-11-16 ENCOUNTER — Encounter (INDEPENDENT_AMBULATORY_CARE_PROVIDER_SITE_OTHER): Payer: Self-pay | Admitting: *Deleted

## 2007-02-18 ENCOUNTER — Telehealth: Payer: Self-pay | Admitting: *Deleted

## 2007-02-20 ENCOUNTER — Encounter (INDEPENDENT_AMBULATORY_CARE_PROVIDER_SITE_OTHER): Payer: Self-pay | Admitting: Family Medicine

## 2007-02-20 ENCOUNTER — Ambulatory Visit: Payer: Self-pay | Admitting: Family Medicine

## 2007-02-20 LAB — CONVERTED CEMR LAB
HCT: 22.8 % — ABNORMAL LOW (ref 39.0–52.0)
MCHC: 36.4 g/dL — ABNORMAL HIGH (ref 30.0–36.0)
MCV: 110.7 fL — ABNORMAL HIGH (ref 78.0–100.0)
Platelets: 206 10*3/uL (ref 150–400)
RDW: 20.5 % — ABNORMAL HIGH (ref 11.5–15.5)

## 2007-02-21 ENCOUNTER — Inpatient Hospital Stay (HOSPITAL_COMMUNITY): Admission: EM | Admit: 2007-02-21 | Discharge: 2007-02-23 | Payer: Self-pay | Admitting: Emergency Medicine

## 2007-02-21 ENCOUNTER — Ambulatory Visit: Payer: Self-pay | Admitting: Family Medicine

## 2007-02-21 ENCOUNTER — Encounter: Payer: Self-pay | Admitting: Family Medicine

## 2007-02-21 IMAGING — CR DG CHEST 2V
2 series · 2 of 2 positions shown · non-contrast
Comparison: 09/27/04.

CLINICAL DATA: Sickle cell crisis.  Cough.  Shortness of breath.  Dyspnea.  
 CHEST - 2 VIEW:

[w chest pa]
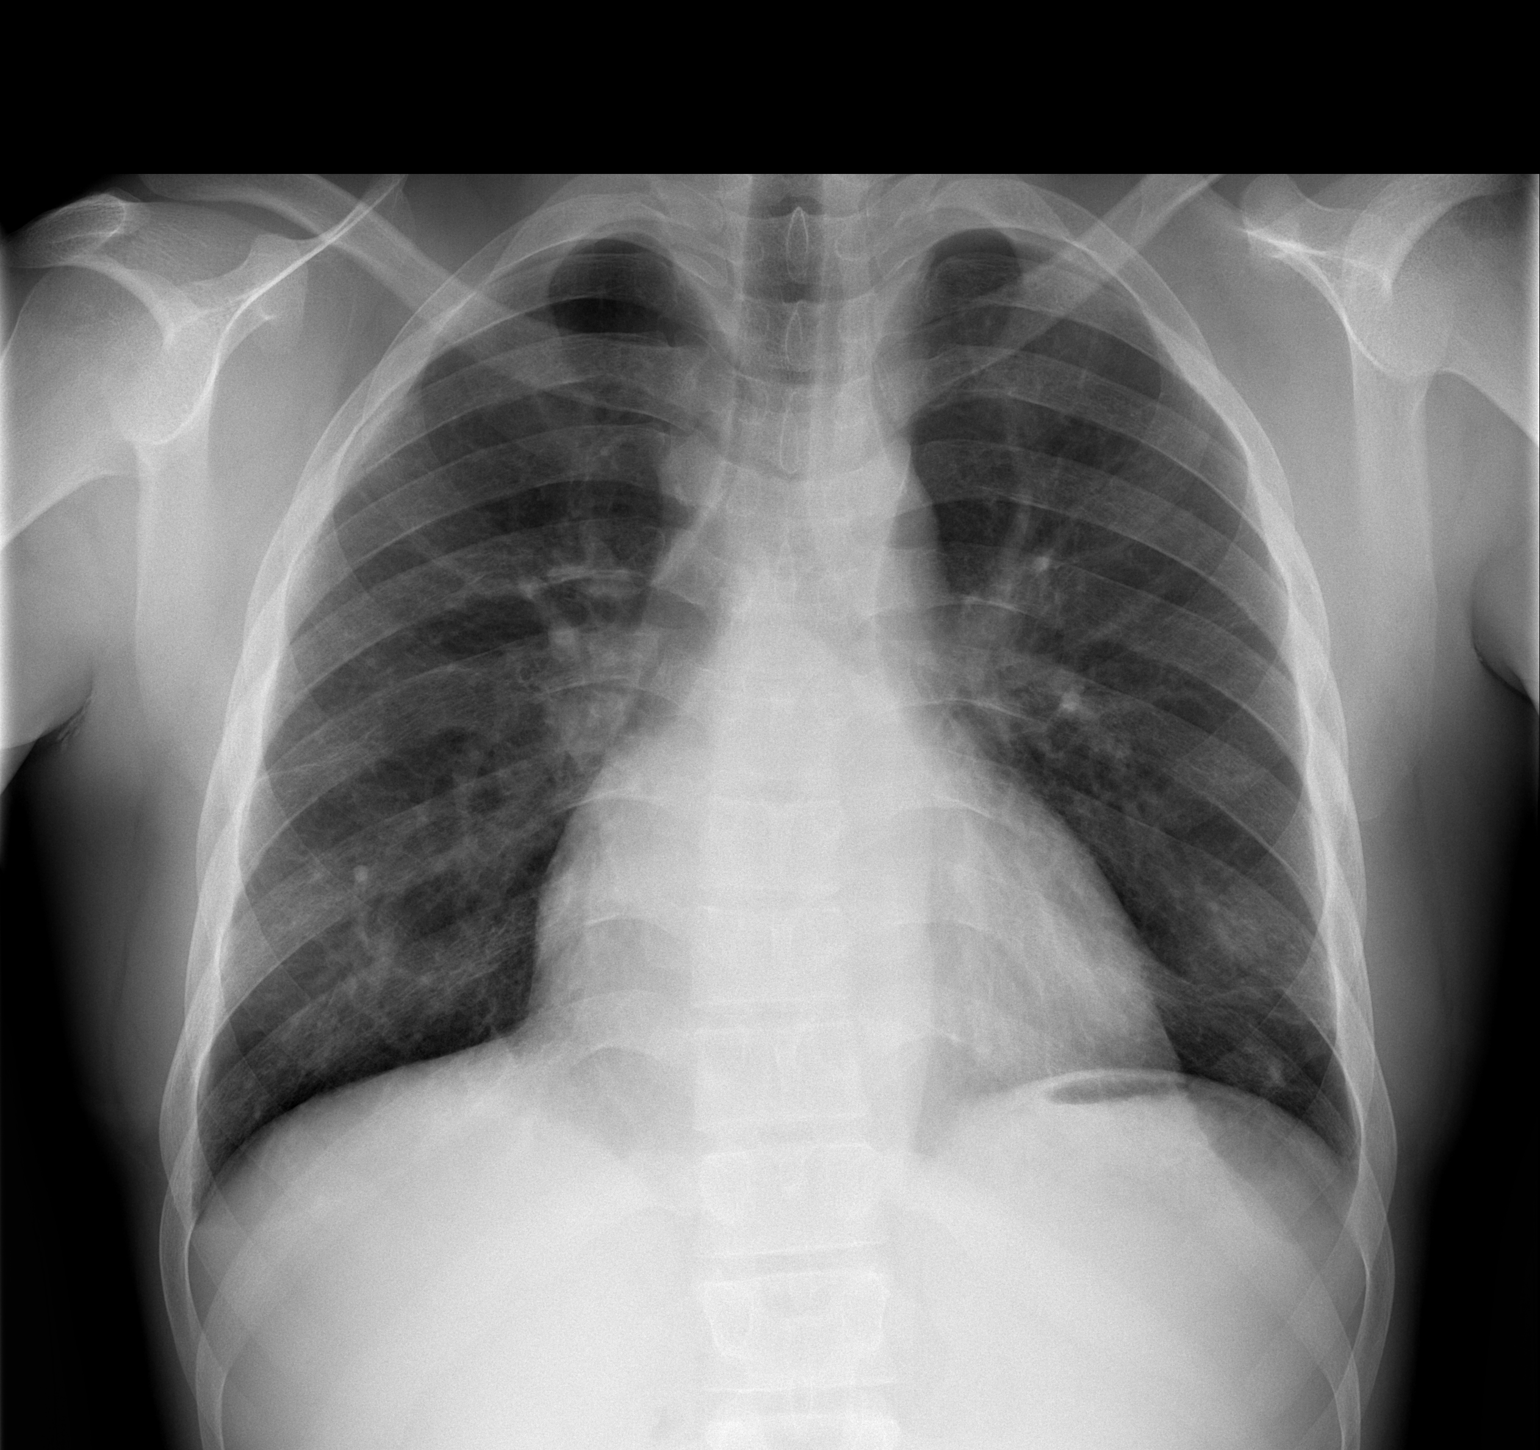

[w chest lat]
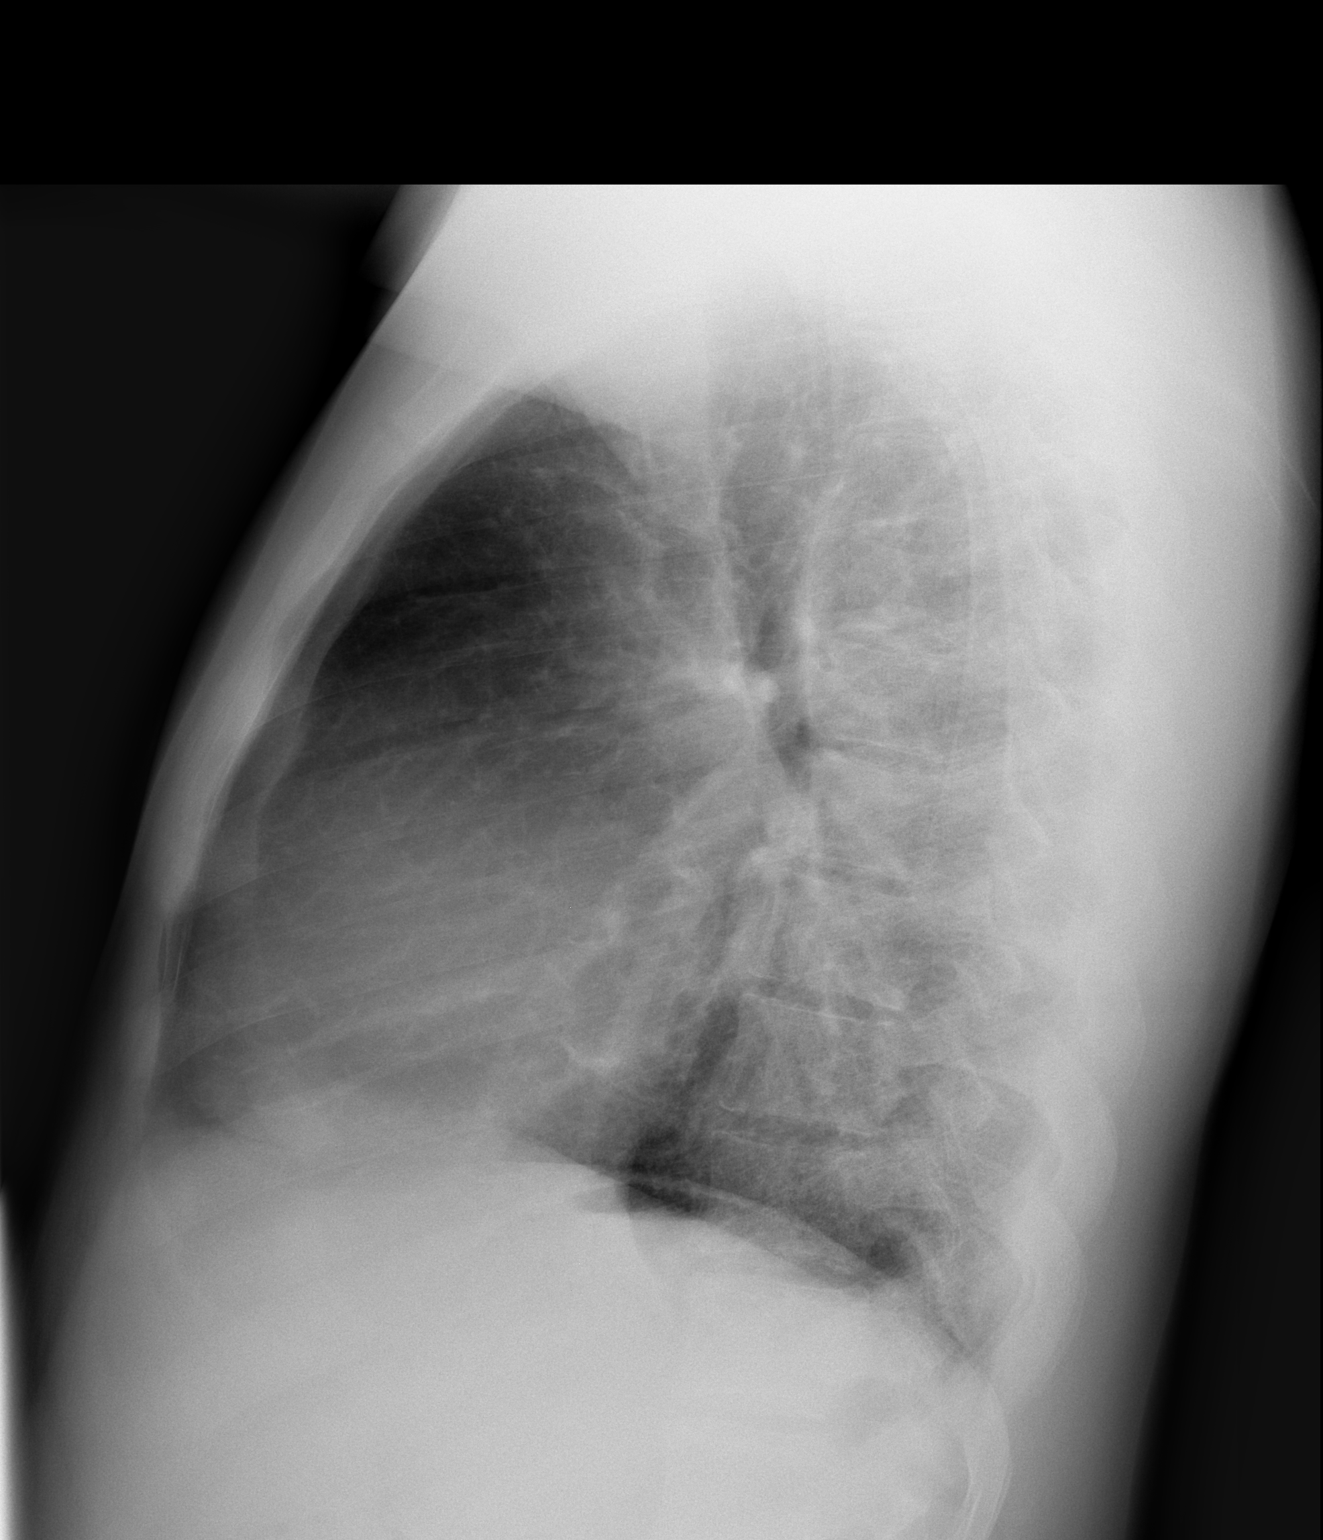

[2 of 2 positions shown; findings below may reference images not displayed]

FINDINGS: Mild cardiomegaly.  No CHF or acute lung process.
IMPRESSION: Mild cardiomegaly.  Otherwise negative chest x-ray.

## 2007-02-22 IMAGING — CR DG CHEST 2V
2 series · 2 of 2 positions shown · non-contrast
Comparison: 09/16/05.

CLINICAL DATA: Sickle Cell crisis.
 CHEST ? 2 VIEW:

[view not recorded (1 of 2)]
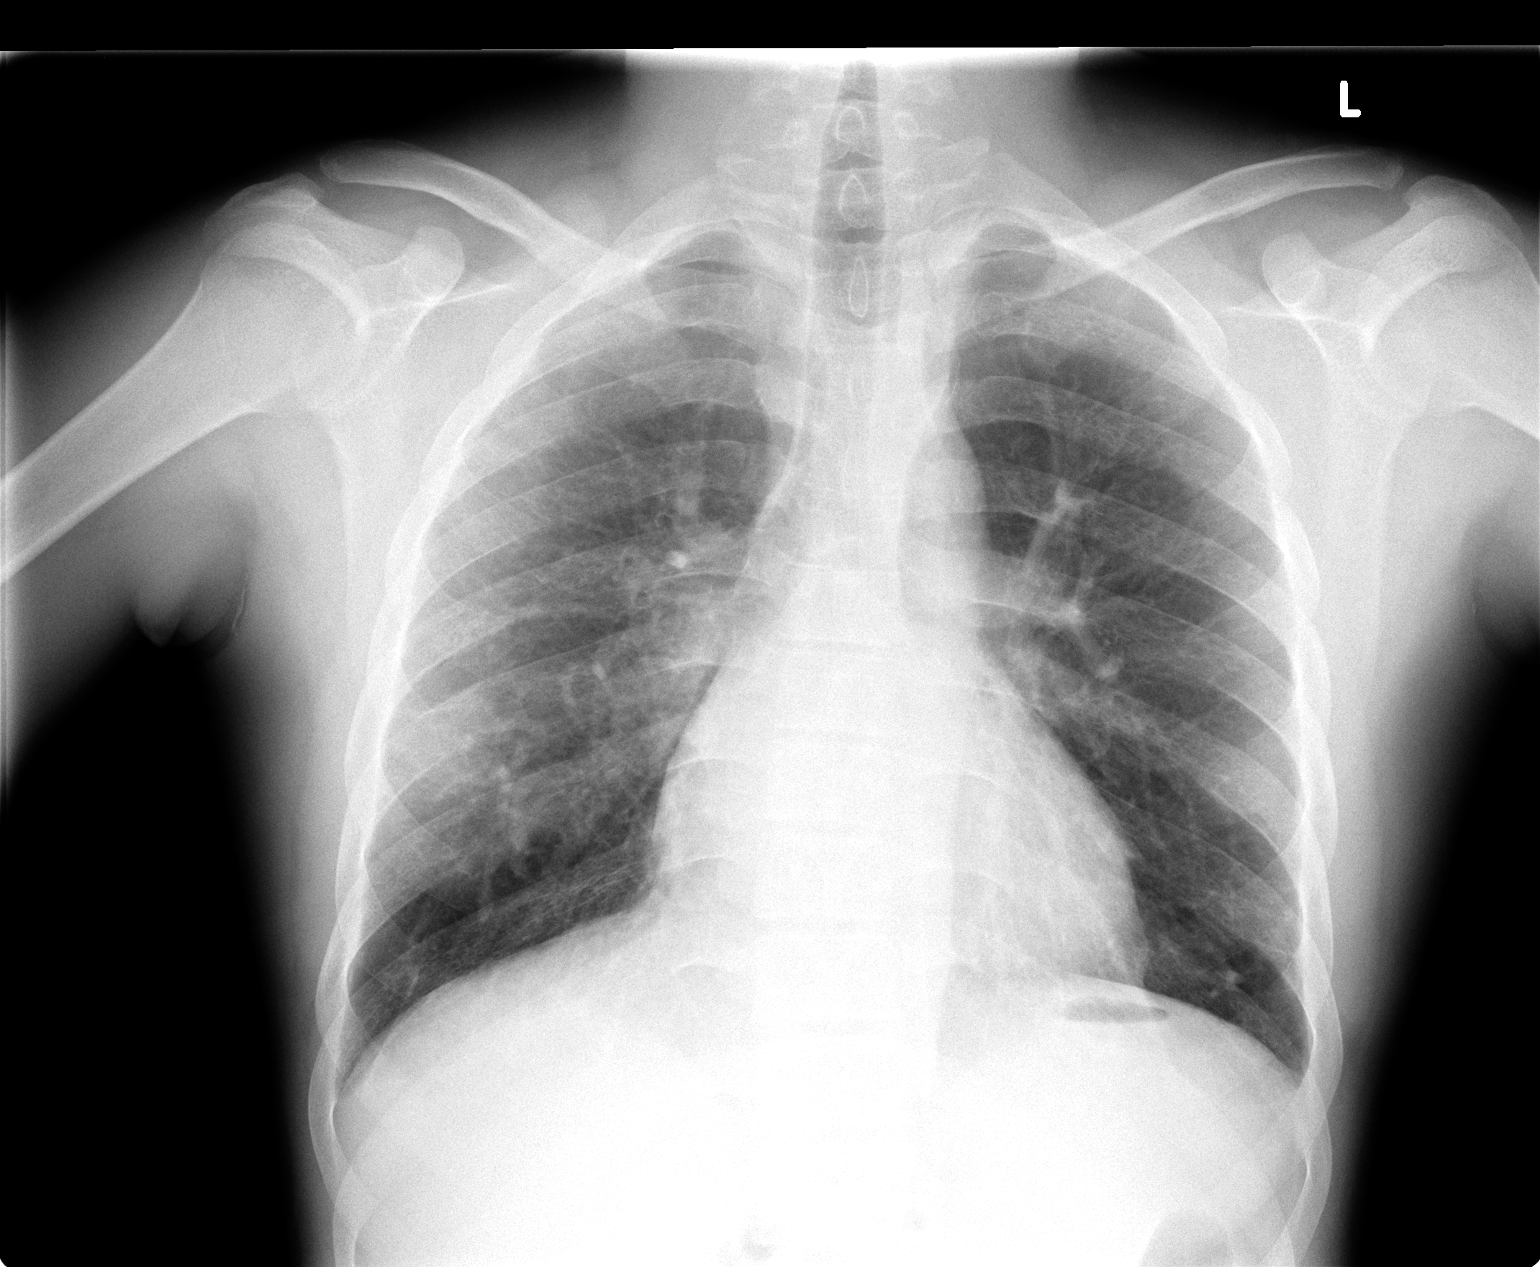

[view not recorded (2 of 2)]
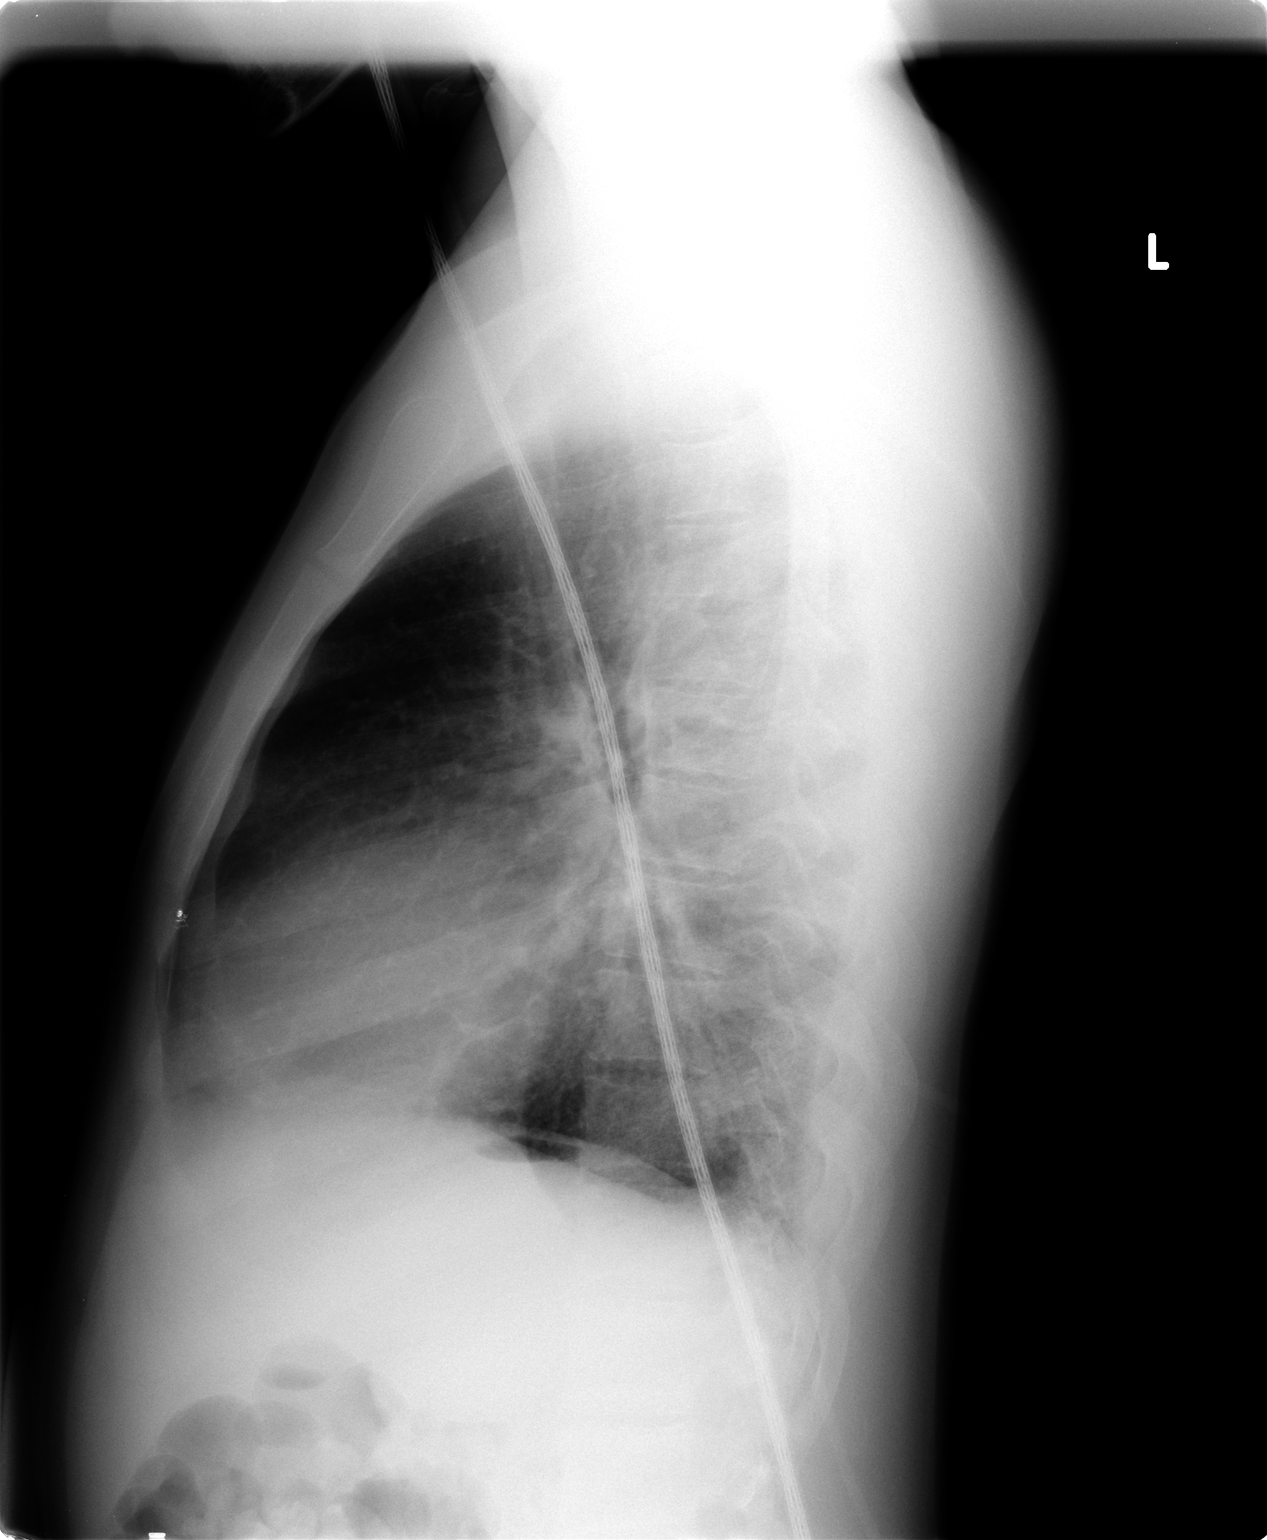

[2 of 2 positions shown; findings below may reference images not displayed]

FINDINGS: Heart size is mildly enlarged.  There are chronic scar-like opacities in the lungs consistent with history of Sickle Cell Disease.  There are no acute airspace opacities.
IMPRESSION: Chronic scarring and mild cardiomegaly consistent with Sickle Cell Disease.

## 2007-03-15 ENCOUNTER — Ambulatory Visit: Payer: Self-pay | Admitting: Family Medicine

## 2007-04-16 IMAGING — CR DG CHEST 2V
2 series · 2 of 2 positions shown · non-contrast
Comparison: 11/09/05.

CLINICAL DATA: Sickle cell crisis/short of breath. 
 CHEST - 2 VIEW:

[w chest pa]
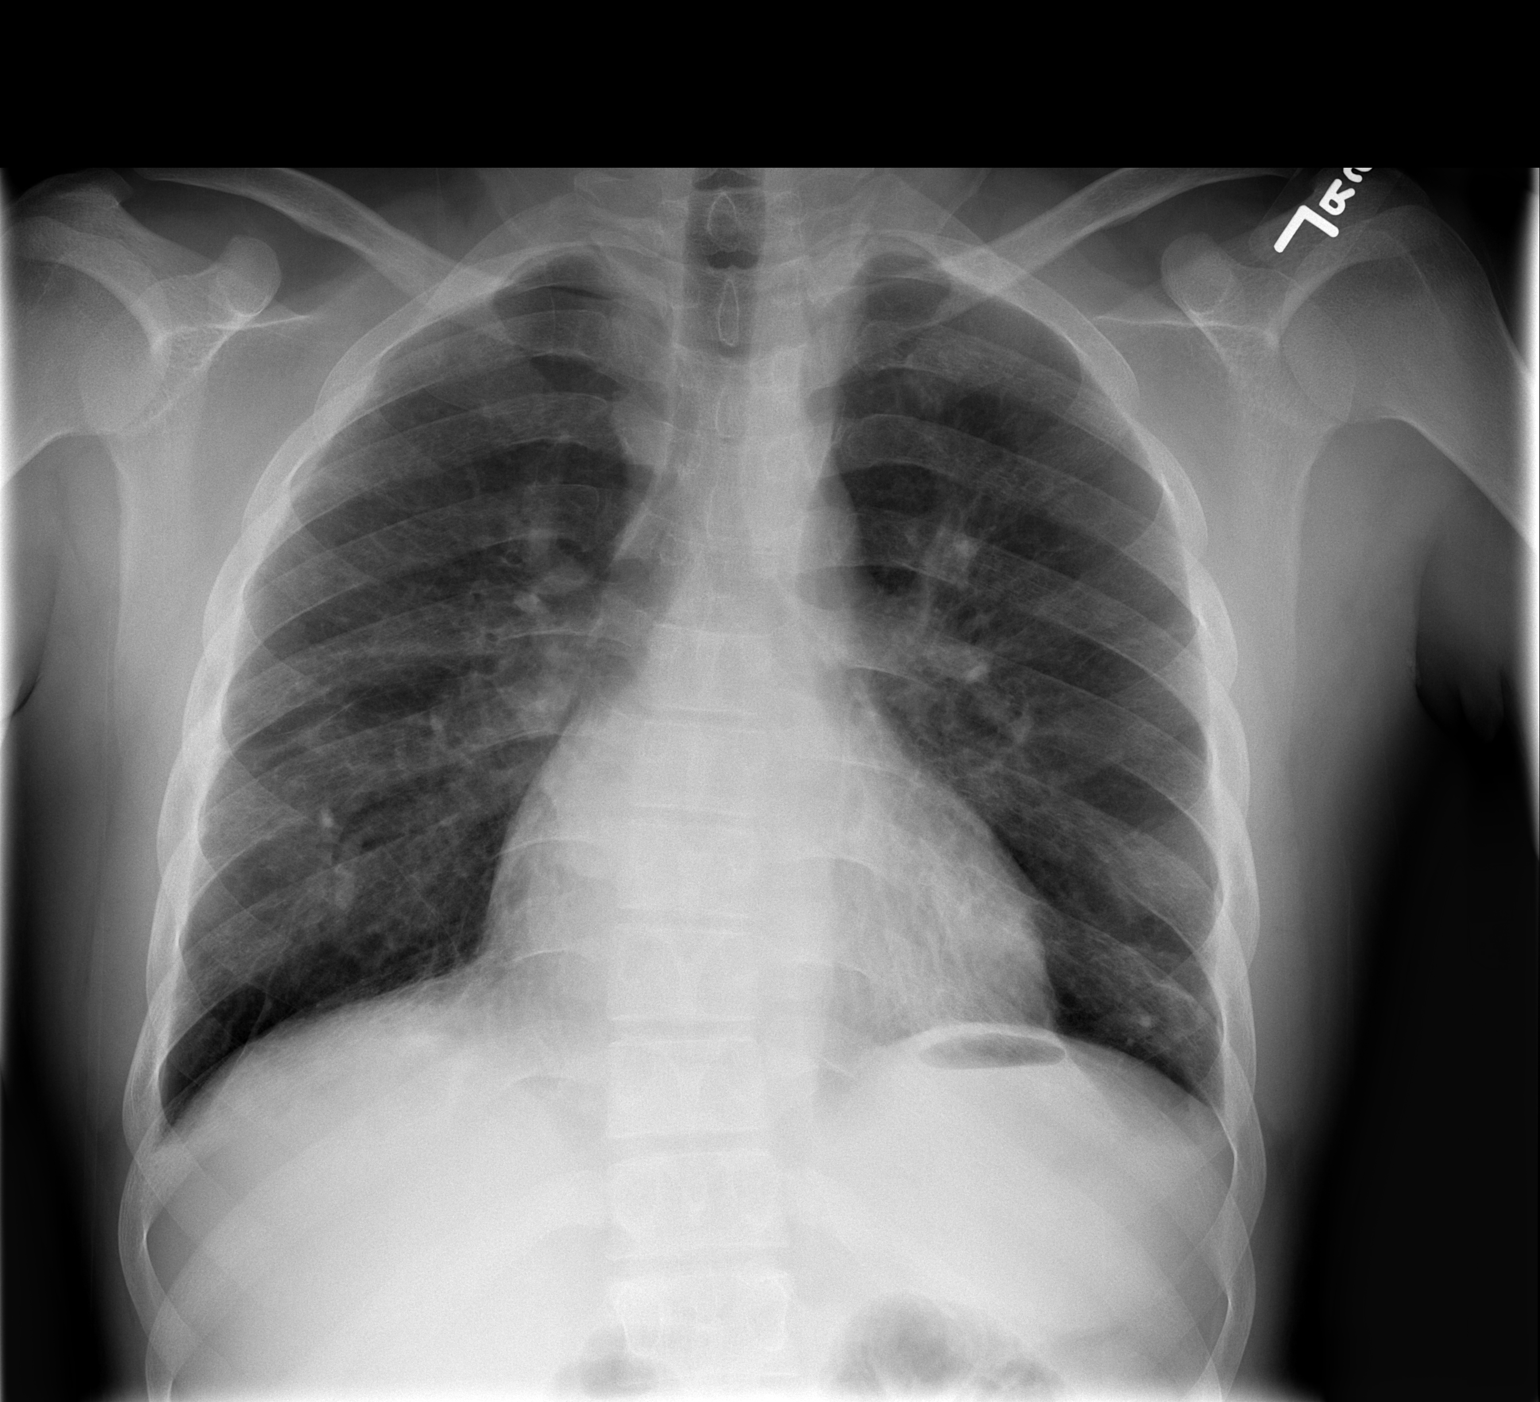

[w chest lat]
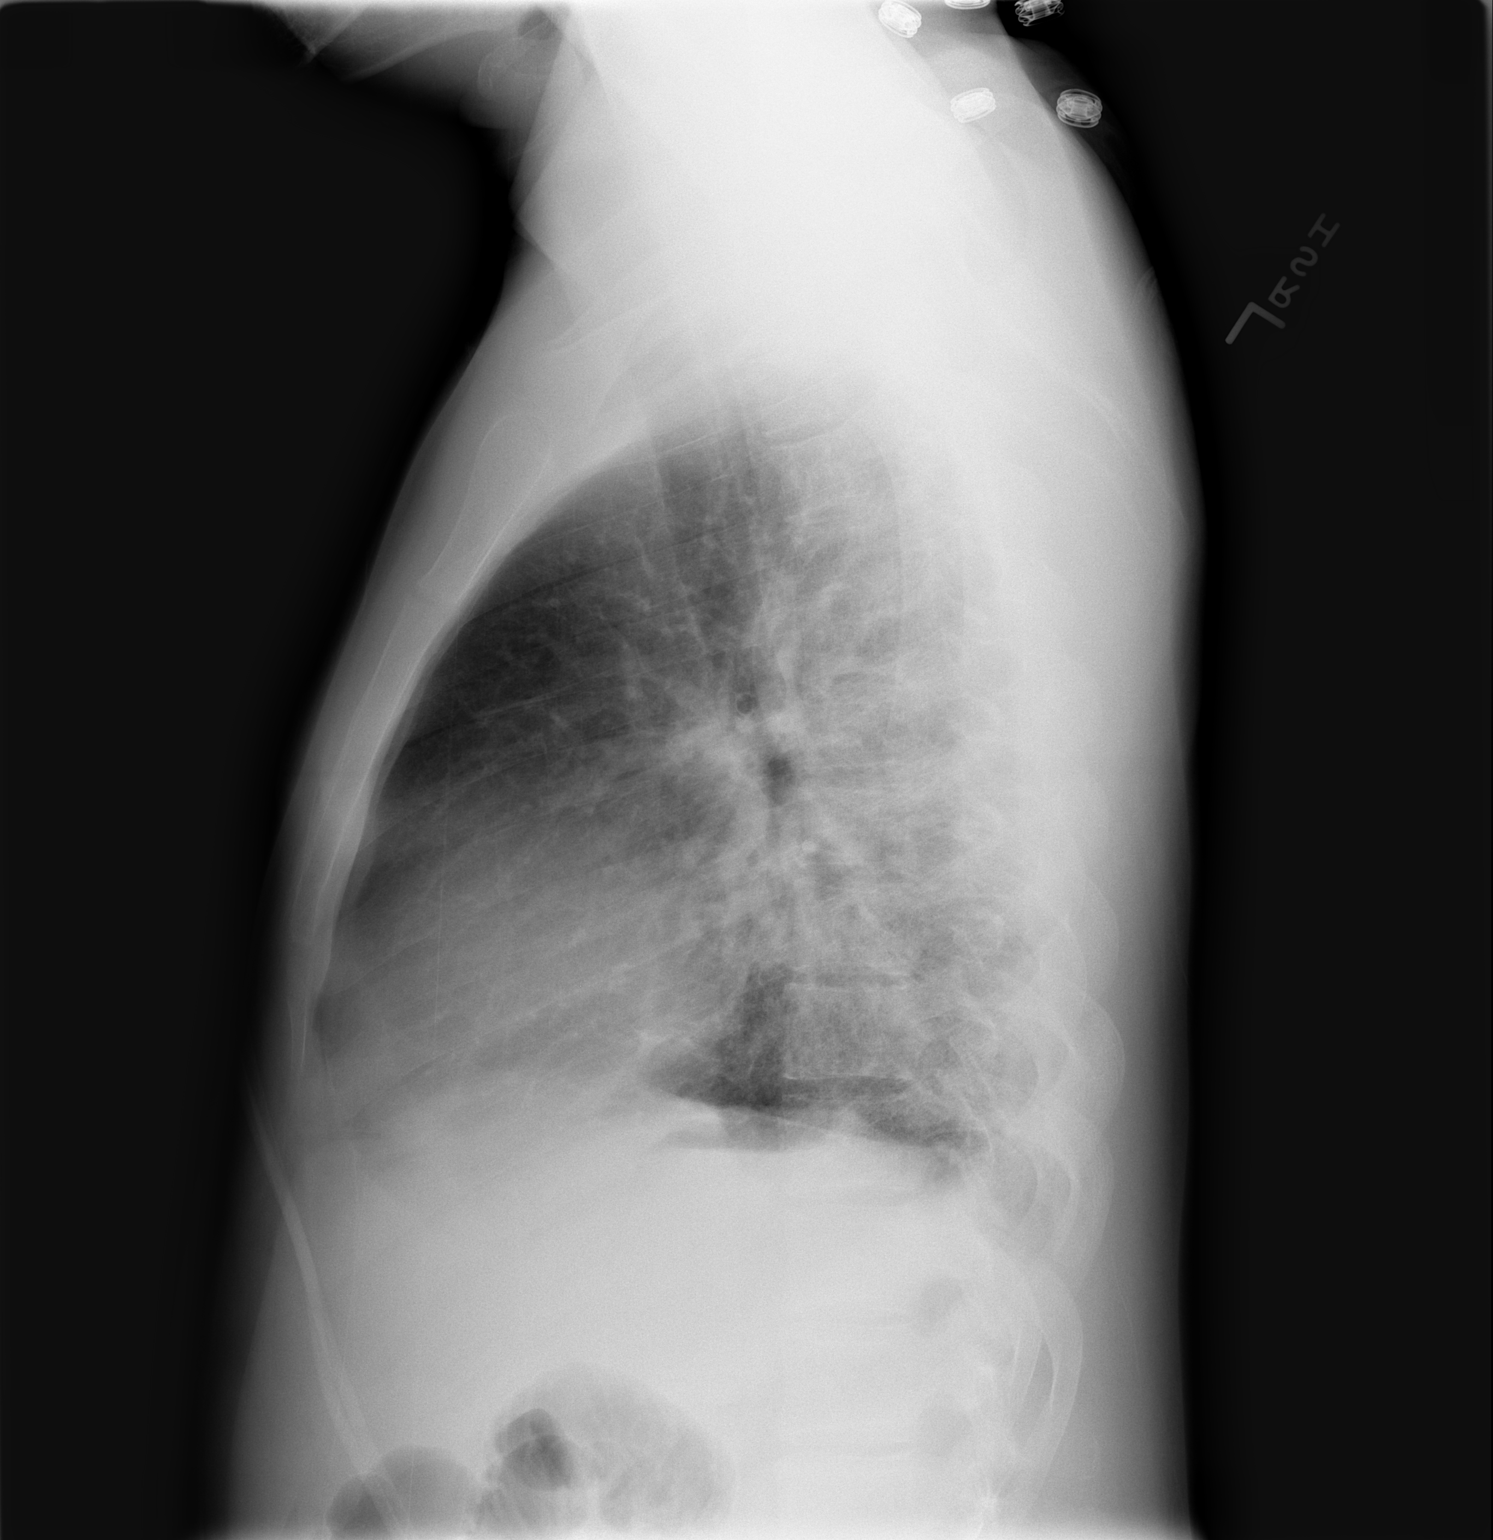

[2 of 2 positions shown; findings below may reference images not displayed]

FINDINGS: Heart mildly enlarged without congestive heart failure.  Prominent lung markings which are chronic and unchanged.  No active air space disease or pleural fluid.
IMPRESSION: 1.  Cardiomegaly without congestive heart failure.  
 2.  Chronic lung changes - no acute process.

## 2007-04-16 IMAGING — CR DG ABDOMEN 1V
1 series · 1 of 1 positions shown · non-contrast
Comparison: None

CLINICAL DATA: Sickle cell crisis

ABDOMEN - 1 VIEW

[view not recorded]
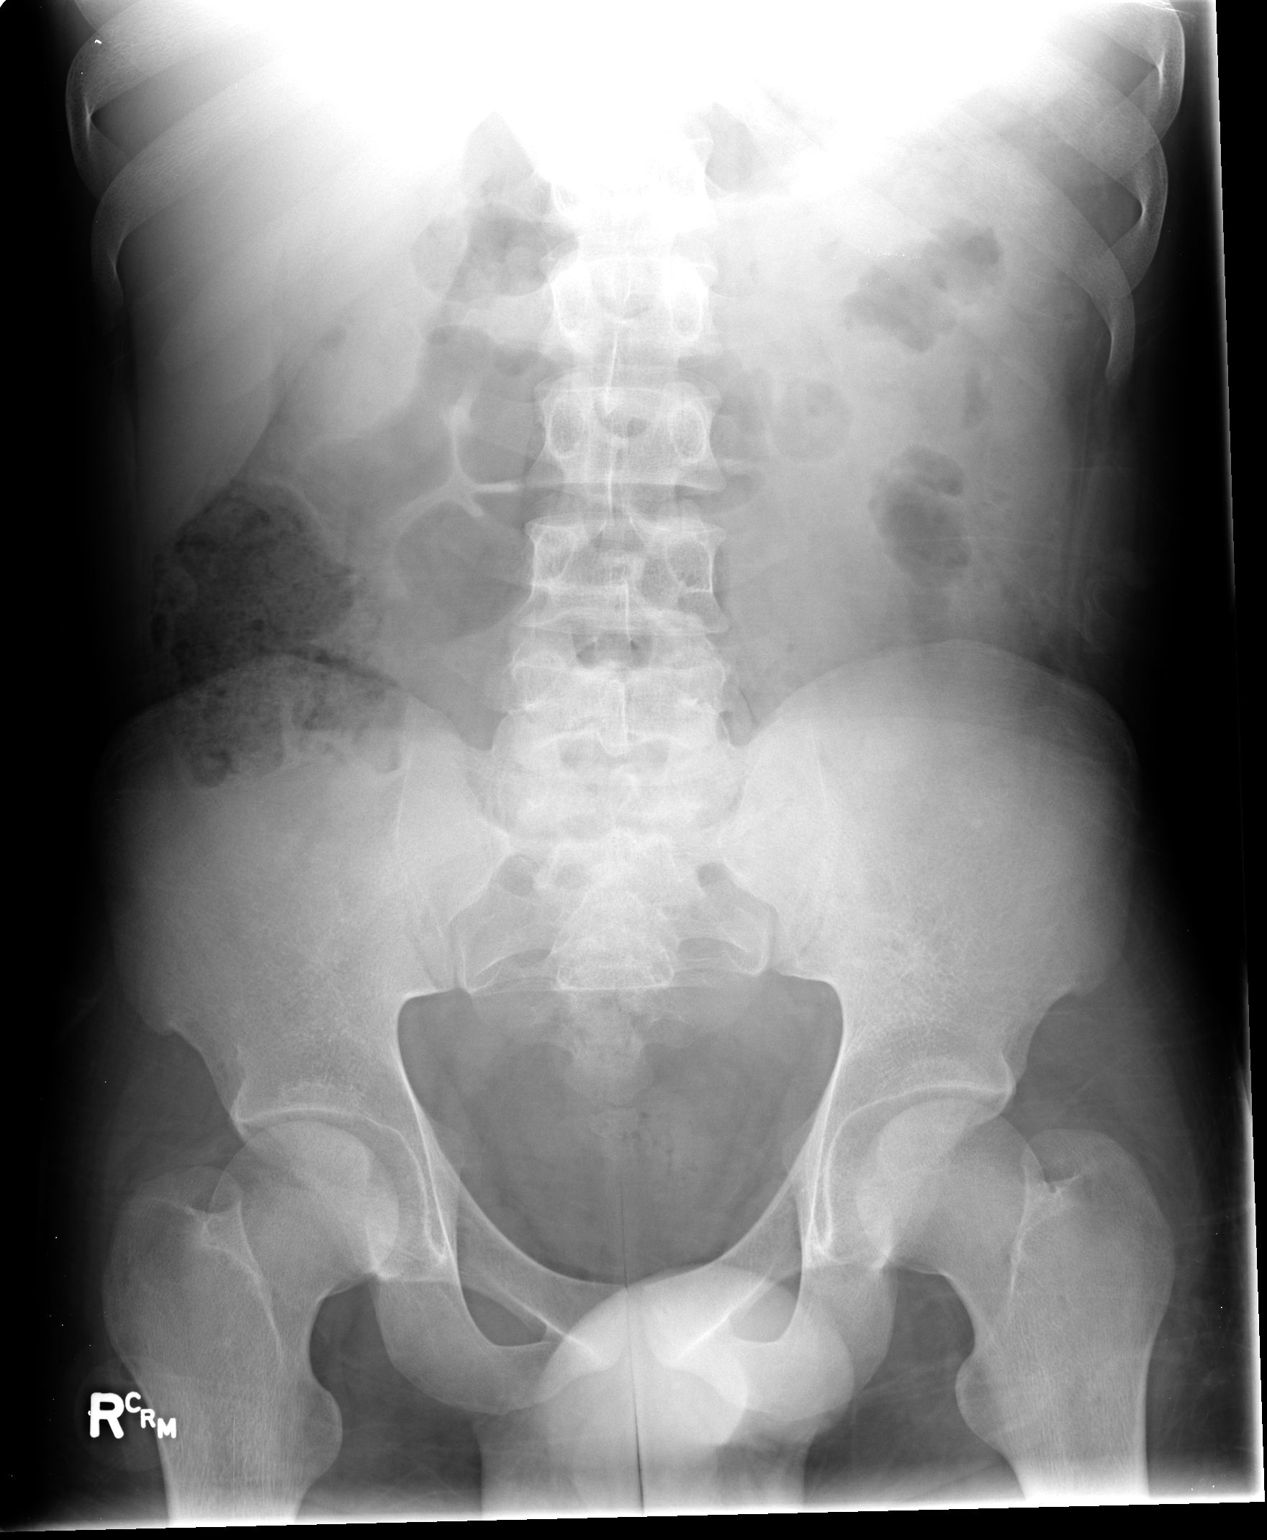

[1 of 1 positions shown; findings below may reference images not displayed]

FINDINGS: There is a nonobstructive bowel gas pattern. No supine evidence for
free air. No organomegaly or suspicious calcification. Visualized skeleton
unremarkable.
IMPRESSION: No acute findings.

## 2007-04-18 IMAGING — CR DG CHEST 1V PORT
1 series · 1 of 1 positions shown · non-contrast
Comparison: prior day?s film
 Worsening bilateral pulmonary infiltrates particularly on the right.  The heart remains enlarged.  Mild bibasilar atelectasis.  No pneumothorax.

CLINICAL DATA: Sickle cell crisis, fever, shortness of breath.
 PORTABLE CHEST- 1 VIEW (2182 hours):

[view not recorded]
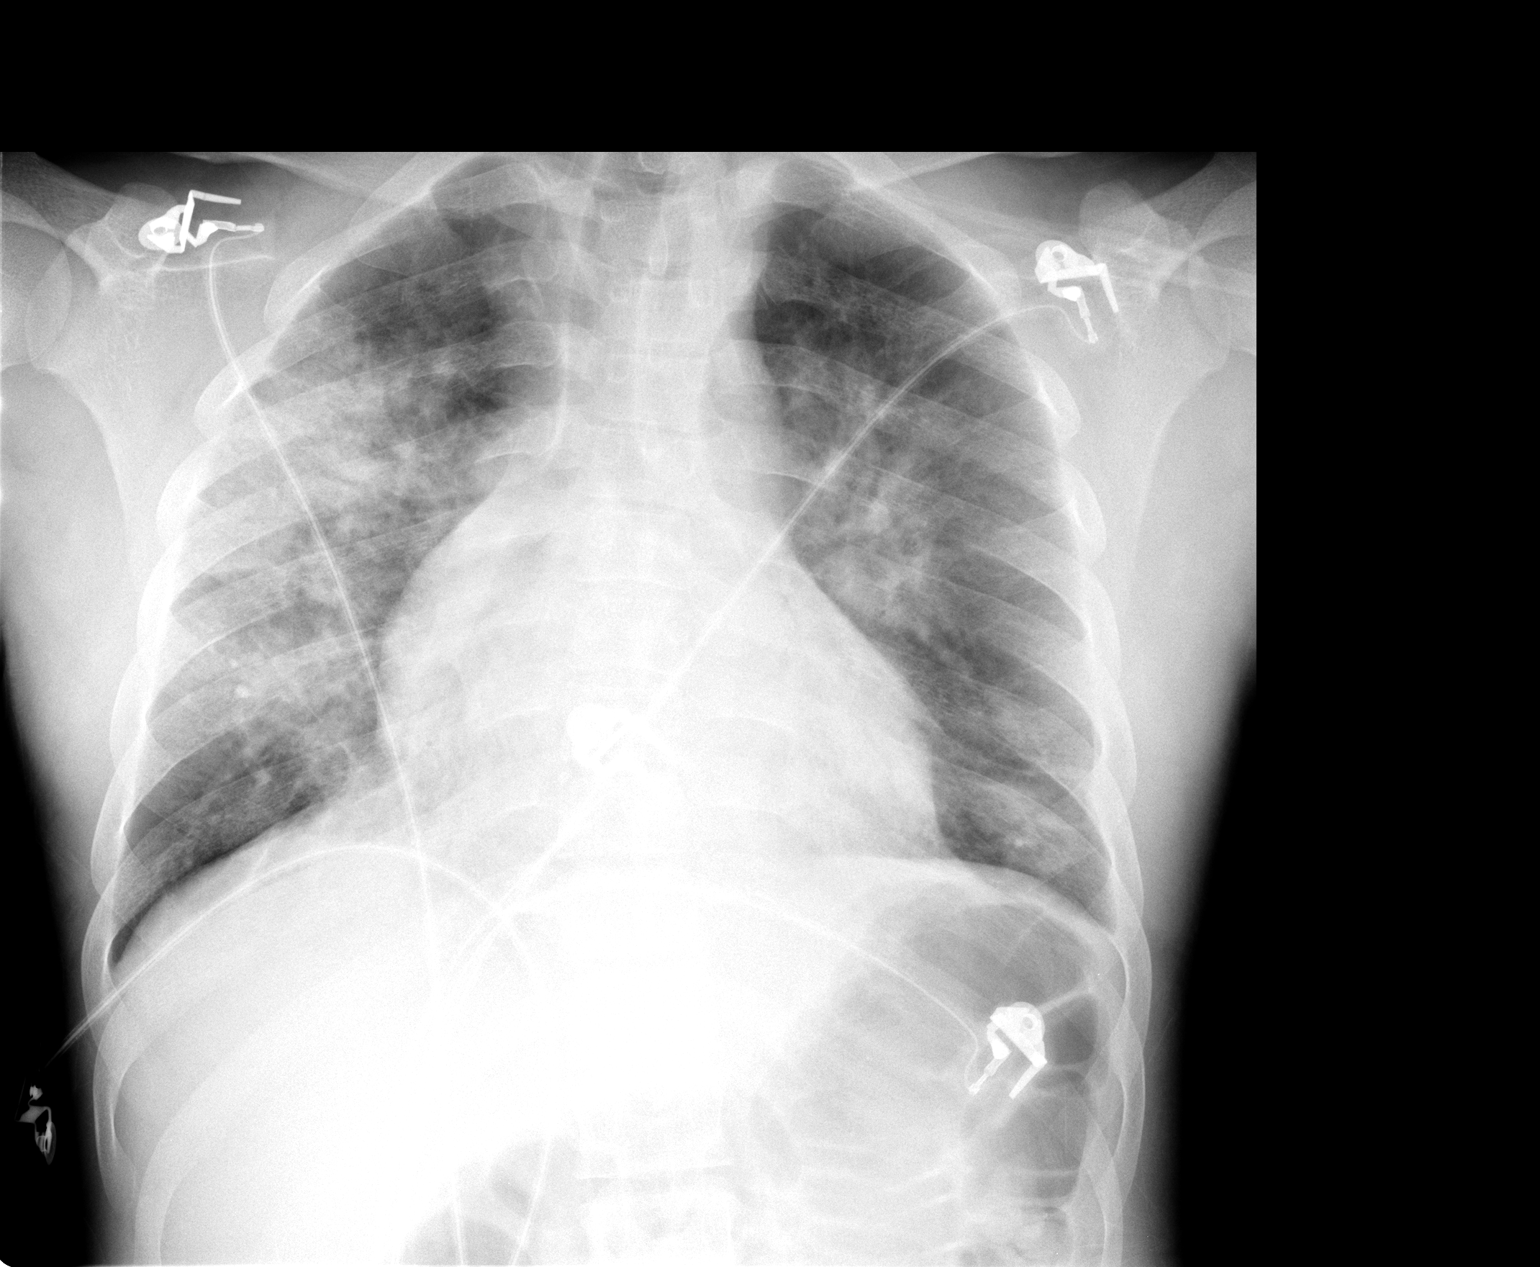

[1 of 1 positions shown; findings below may reference images not displayed]

IMPRESSION: Worsening bilateral pulmonary densities likely representing pneumonia.

## 2007-04-20 IMAGING — CR DG CHEST 1V PORT
1 series · 1 of 1 positions shown · non-contrast
Comparison: 11/11/05.

CLINICAL DATA: Sickle cell crisis.
 PORTABLE CHEST:

[view not recorded]
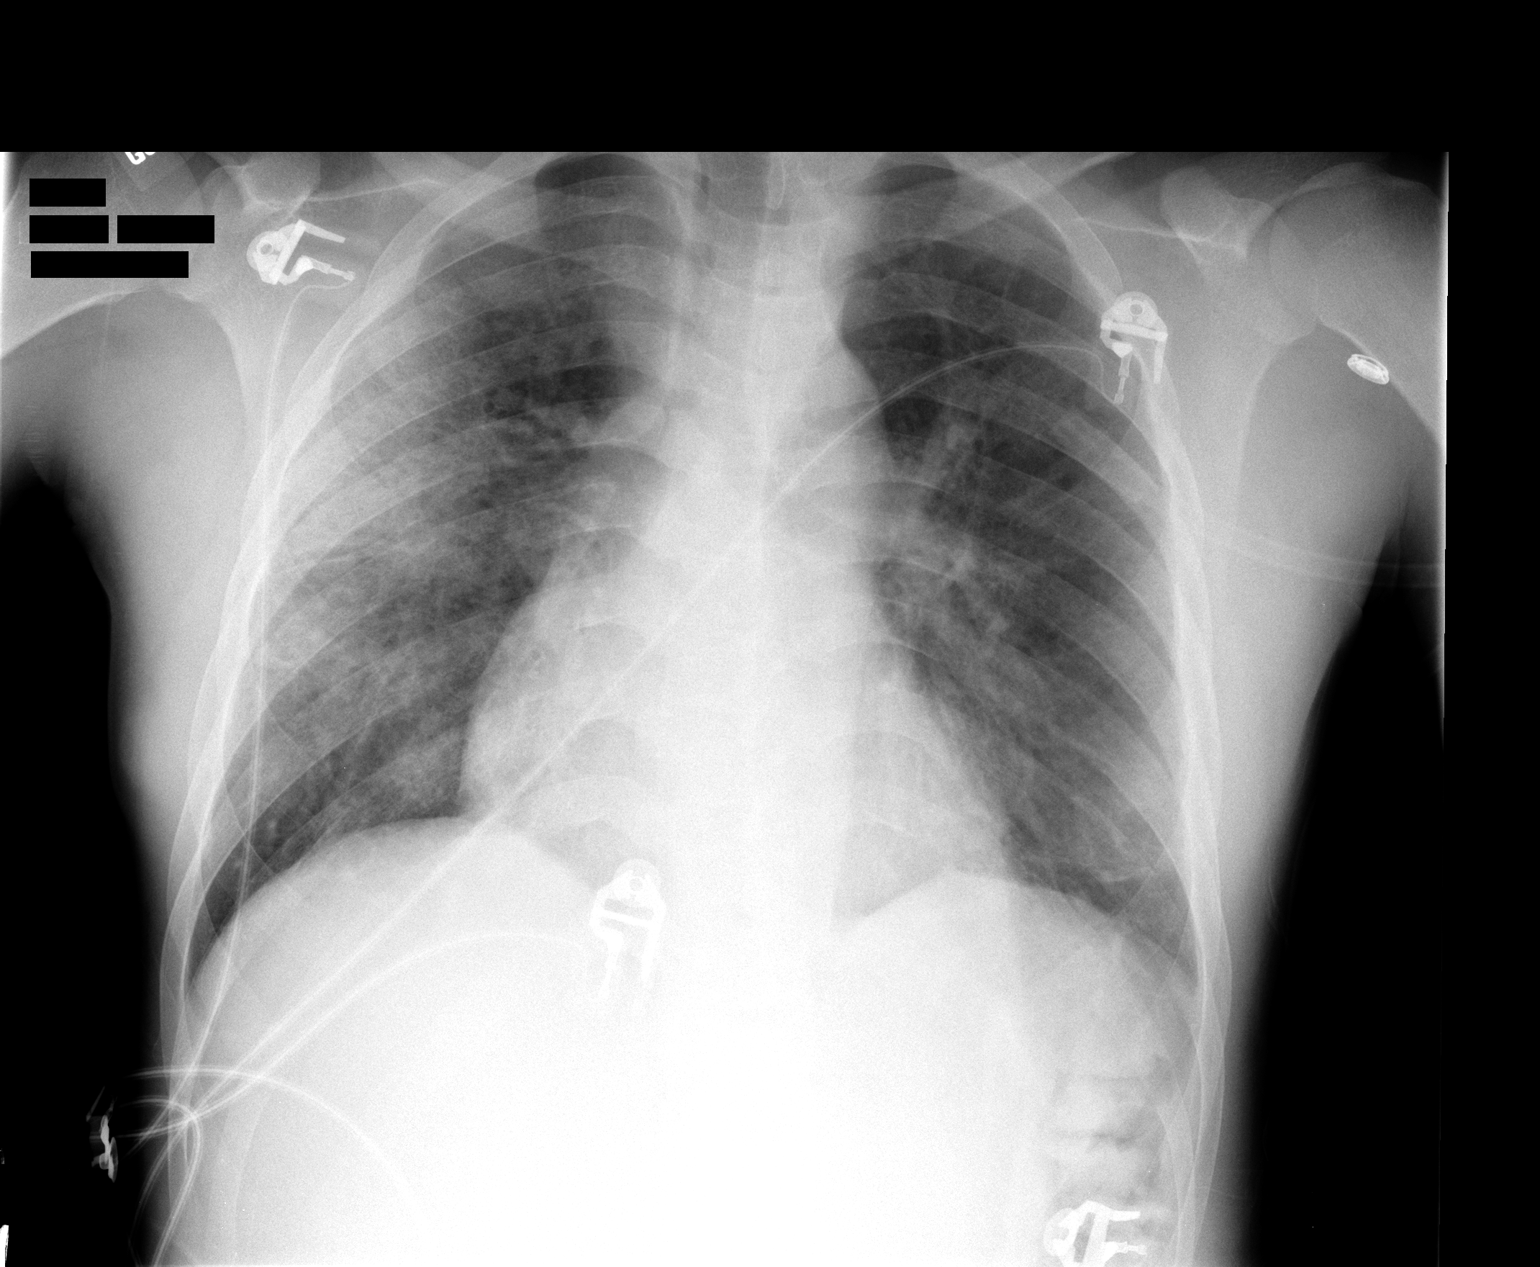

[1 of 1 positions shown; findings below may reference images not displayed]

FINDINGS: Bilateral airspace disease persists but is improved somewhat.  This is best seen in the left mid lung and right lower lung zone.  Heart size upper normal.  Bony changes of sickle cell disease noted.
IMPRESSION: Some interval improvement in bilateral airspace disease which could reflect improving pneumonia.

## 2007-05-22 ENCOUNTER — Ambulatory Visit: Payer: Self-pay | Admitting: Family Medicine

## 2007-05-22 DIAGNOSIS — F17200 Nicotine dependence, unspecified, uncomplicated: Secondary | ICD-10-CM | POA: Insufficient documentation

## 2007-07-21 IMAGING — CR DG CHEST 2V
2 series · 2 of 2 positions shown · non-contrast
Comparison: None.

CLINICAL DATA: Sickle cell crisis, now with chest pain.
 CHEST - 2 VIEW - 02/13/06:

[w chest pa]
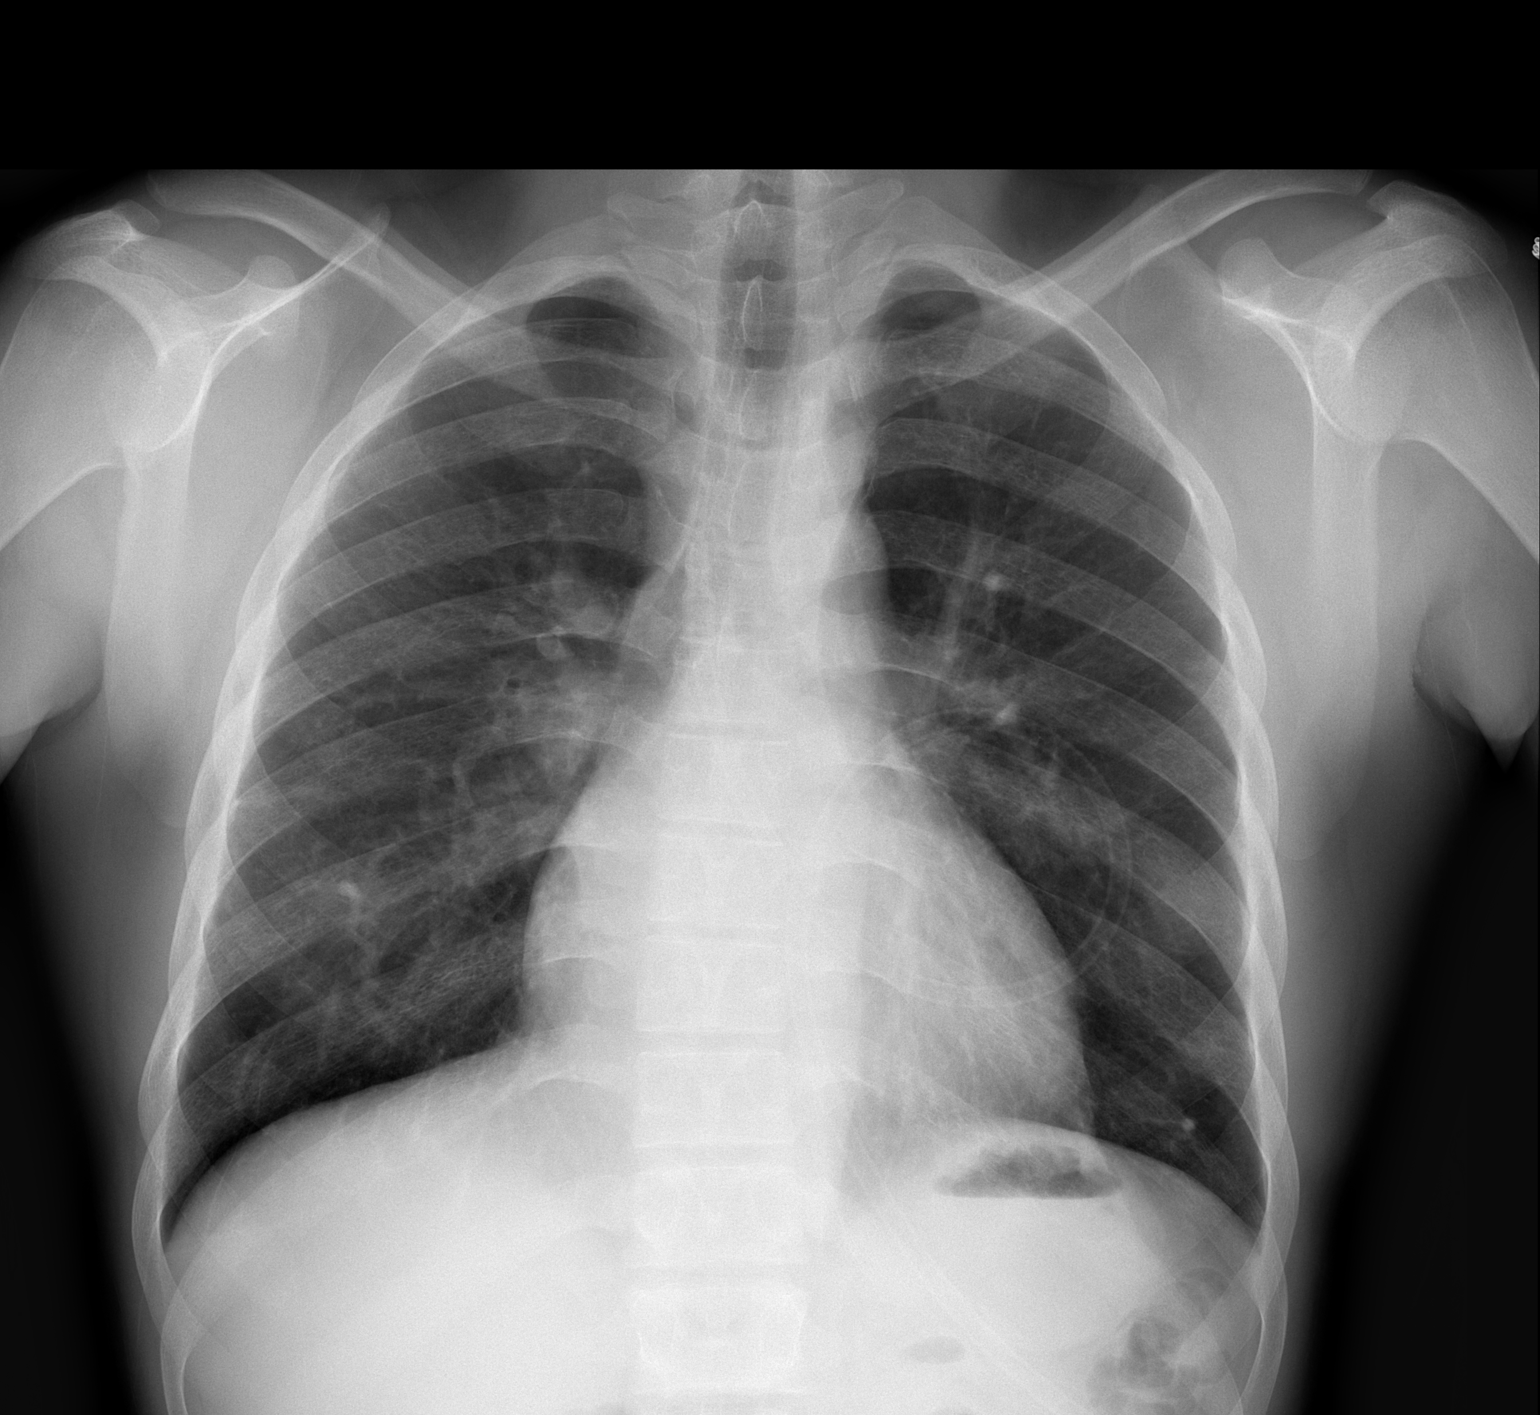

[w chest lat]
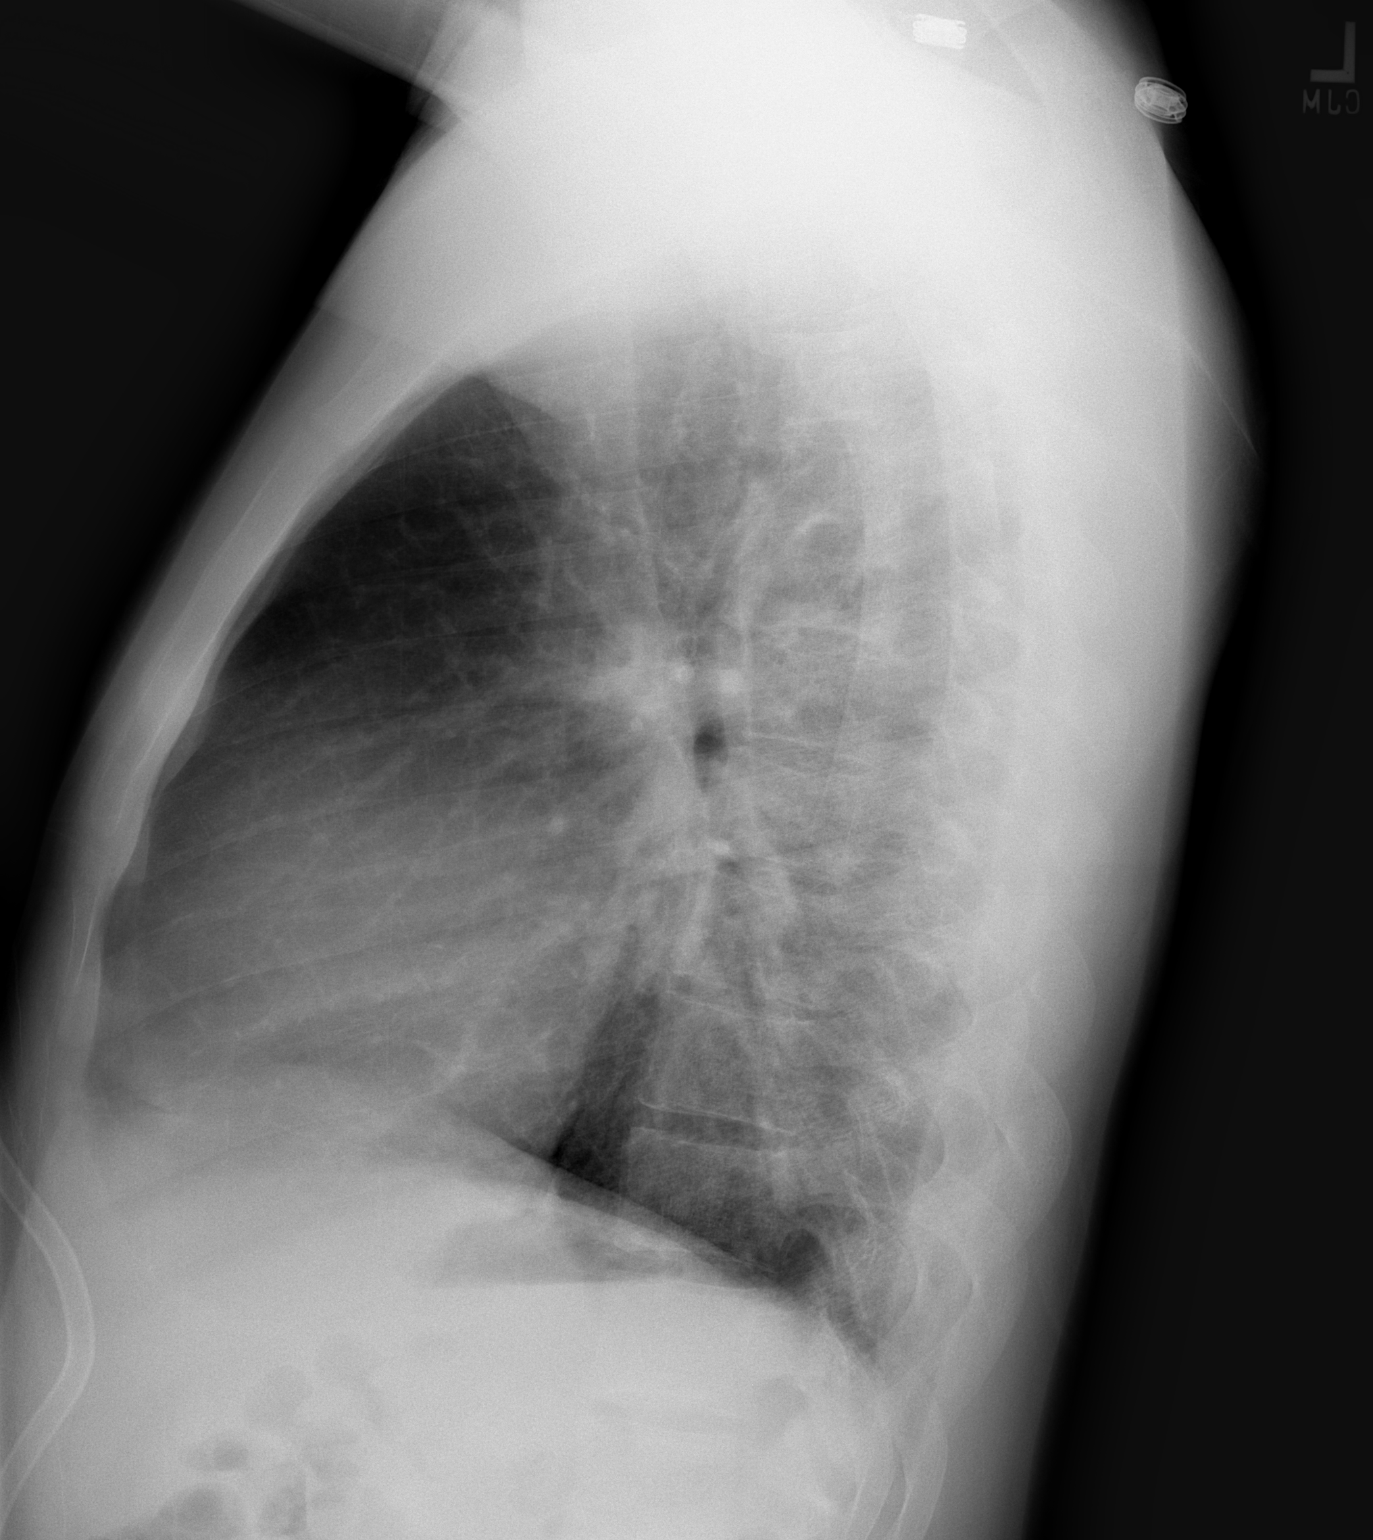

[2 of 2 positions shown; findings below may reference images not displayed]

FINDINGS: The heart size is mildly enlarged.  There are no effusions or edema.   Mild coarsened interstitial markings are noted.  There are skeletal changes consistent with the history of sickle cell crisis. There is no evidence for acute chest or acute pneumonia is noted.
IMPRESSION: 1.  Coarsened interstitial markings.
 2.  Skeletal changes consistent with sickle cell disease.

## 2007-08-05 ENCOUNTER — Emergency Department (HOSPITAL_COMMUNITY): Admission: EM | Admit: 2007-08-05 | Discharge: 2007-08-05 | Payer: Self-pay | Admitting: Emergency Medicine

## 2007-08-05 ENCOUNTER — Ambulatory Visit: Payer: Self-pay | Admitting: Family Medicine

## 2007-08-05 LAB — CONVERTED CEMR LAB
Glucose, Urine, Semiquant: NEGATIVE
Nitrite: NEGATIVE
Specific Gravity, Urine: 1.025
pH: 6.5

## 2007-08-26 ENCOUNTER — Encounter: Payer: Self-pay | Admitting: Family Medicine

## 2007-09-09 ENCOUNTER — Encounter: Payer: Self-pay | Admitting: Family Medicine

## 2007-09-09 ENCOUNTER — Ambulatory Visit: Payer: Self-pay | Admitting: Family Medicine

## 2007-09-10 ENCOUNTER — Telehealth: Payer: Self-pay | Admitting: Family Medicine

## 2007-09-10 LAB — CONVERTED CEMR LAB
Albumin: 4.4 g/dL (ref 3.5–5.2)
BUN: 25 mg/dL — ABNORMAL HIGH (ref 6–23)
Calcium: 9 mg/dL (ref 8.4–10.5)
Chloride: 111 meq/L (ref 96–112)
Glucose, Bld: 81 mg/dL (ref 70–99)
Hemoglobin: 9.6 g/dL — ABNORMAL LOW (ref 13.0–17.0)
Potassium: 5.7 meq/L — ABNORMAL HIGH (ref 3.5–5.3)
RBC: 2.85 M/uL — ABNORMAL LOW (ref 4.22–5.81)
Sodium: 139 meq/L (ref 135–145)
Total Protein: 7.1 g/dL (ref 6.0–8.3)
WBC: 9.4 10*3/uL (ref 4.0–10.5)

## 2007-10-08 ENCOUNTER — Ambulatory Visit: Payer: Self-pay | Admitting: Family Medicine

## 2007-10-08 ENCOUNTER — Encounter: Payer: Self-pay | Admitting: Family Medicine

## 2007-10-09 ENCOUNTER — Telehealth (INDEPENDENT_AMBULATORY_CARE_PROVIDER_SITE_OTHER): Payer: Self-pay | Admitting: Family Medicine

## 2007-10-09 LAB — CONVERTED CEMR LAB
Basophils Absolute: 0 10*3/uL (ref 0.0–0.1)
Basophils Relative: 0 % (ref 0–1)
Hemoglobin: 6.9 g/dL — CL (ref 13.0–17.0)
Lymphocytes Relative: 43 % (ref 12–46)
Monocytes Absolute: 1.4 10*3/uL — ABNORMAL HIGH (ref 0.1–1.0)
Neutro Abs: 6.4 10*3/uL (ref 1.7–7.7)
Neutrophils Relative %: 44 % (ref 43–77)
RBC: 2.08 M/uL — ABNORMAL LOW (ref 4.22–5.81)
RDW: 20.5 % — ABNORMAL HIGH (ref 11.5–15.5)
Retic Ct Pct: 12.7 % — ABNORMAL HIGH (ref 0.4–3.1)

## 2007-10-23 ENCOUNTER — Encounter: Payer: Self-pay | Admitting: Family Medicine

## 2008-02-20 ENCOUNTER — Encounter: Payer: Self-pay | Admitting: Family Medicine

## 2008-02-20 ENCOUNTER — Ambulatory Visit: Payer: Self-pay | Admitting: Family Medicine

## 2008-02-20 ENCOUNTER — Telehealth: Payer: Self-pay | Admitting: Family Medicine

## 2008-02-20 ENCOUNTER — Telehealth: Payer: Self-pay | Admitting: *Deleted

## 2008-02-20 LAB — CONVERTED CEMR LAB
HCT: 21.9 % — ABNORMAL LOW (ref 39.0–52.0)
Platelets: 245 10*3/uL (ref 150–400)
RDW: 23.7 % — ABNORMAL HIGH (ref 11.5–15.5)
WBC: 16 10*3/uL — ABNORMAL HIGH (ref 4.0–10.5)

## 2008-02-21 ENCOUNTER — Telehealth: Payer: Self-pay | Admitting: Family Medicine

## 2008-04-20 ENCOUNTER — Telehealth: Payer: Self-pay | Admitting: Family Medicine

## 2008-04-20 ENCOUNTER — Encounter: Payer: Self-pay | Admitting: Family Medicine

## 2008-04-20 ENCOUNTER — Ambulatory Visit: Payer: Self-pay | Admitting: Family Medicine

## 2008-04-20 LAB — CONVERTED CEMR LAB
Basophils Relative: 0 % (ref 0–1)
HCT: 17.5 % — ABNORMAL LOW (ref 39.0–52.0)
Hemoglobin: 6.7 g/dL — CL (ref 13.0–17.0)
Lymphocytes Relative: 12 % (ref 12–46)
Lymphs Abs: 2.5 10*3/uL (ref 0.7–4.0)
Monocytes Absolute: 1.1 10*3/uL — ABNORMAL HIGH (ref 0.1–1.0)
Monocytes Relative: 5 % (ref 3–12)
Neutro Abs: 16.3 10*3/uL — ABNORMAL HIGH (ref 1.7–7.7)
Neutrophils Relative %: 80 % — ABNORMAL HIGH (ref 43–77)
RBC: 1.82 M/uL — ABNORMAL LOW (ref 4.22–5.81)
RDW: 27.2 % — ABNORMAL HIGH (ref 11.5–15.5)
Retic Ct Pct: 16.2 % — ABNORMAL HIGH (ref 0.4–3.1)
WBC: 20.4 10*3/uL — ABNORMAL HIGH (ref 4.0–10.5)

## 2008-04-21 ENCOUNTER — Encounter: Payer: Self-pay | Admitting: Family Medicine

## 2008-04-23 ENCOUNTER — Encounter: Payer: Self-pay | Admitting: Family Medicine

## 2008-04-23 ENCOUNTER — Ambulatory Visit: Payer: Self-pay | Admitting: Family Medicine

## 2008-04-23 ENCOUNTER — Inpatient Hospital Stay (HOSPITAL_COMMUNITY): Admission: EM | Admit: 2008-04-23 | Discharge: 2008-04-24 | Payer: Self-pay | Admitting: Emergency Medicine

## 2008-06-18 ENCOUNTER — Encounter: Payer: Self-pay | Admitting: Family Medicine

## 2008-06-18 ENCOUNTER — Ambulatory Visit: Payer: Self-pay | Admitting: Family Medicine

## 2008-06-18 DIAGNOSIS — D571 Sickle-cell disease without crisis: Secondary | ICD-10-CM | POA: Insufficient documentation

## 2008-06-18 LAB — CONVERTED CEMR LAB
Basophils Absolute: 0 10*3/uL (ref 0.0–0.1)
Eosinophils Relative: 2 % (ref 0–5)
HCT: 20.5 % — ABNORMAL LOW (ref 39.0–52.0)
Hemoglobin: 6.8 g/dL — CL (ref 13.0–17.0)
Lymphocytes Relative: 41 % (ref 12–46)
Lymphs Abs: 5.5 10*3/uL — ABNORMAL HIGH (ref 0.7–4.0)
Monocytes Absolute: 1.1 10*3/uL — ABNORMAL HIGH (ref 0.1–1.0)
Neutro Abs: 6.5 10*3/uL (ref 1.7–7.7)
RBC: 2.1 M/uL — ABNORMAL LOW (ref 4.22–5.81)
RDW: 23.5 % — ABNORMAL HIGH (ref 11.5–15.5)
Retic Ct Pct: 9 % — ABNORMAL HIGH (ref 0.4–3.1)
WBC: 13.4 10*3/uL — ABNORMAL HIGH (ref 4.0–10.5)

## 2008-07-28 IMAGING — US US ABDOMEN COMPLETE
1 series · 14 of 25 positions shown · non-contrast
Comparison: 07/20/2003

ABDOMEN ULTRASOUND:

CLINICAL DATA: Abdominal pain. Sickle cell disease.
TECHNIQUE: Complete abdominal ultrasound examination was performed including
evaluation of the liver, gallbladder, bile ducts, pancreas, kidneys, spleen,
IVC, and abdominal aorta.

[Series 1: unknown · 0.30mm/px · 14 of 68 slices shown]
[im 1/68]
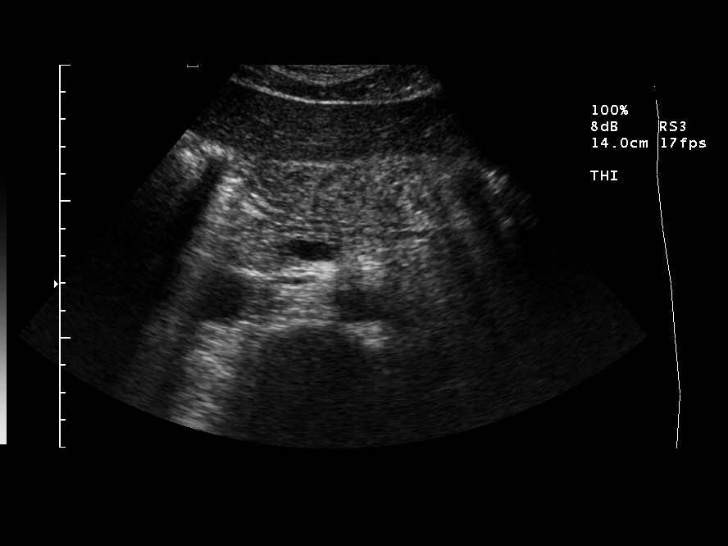
[im 6/68]
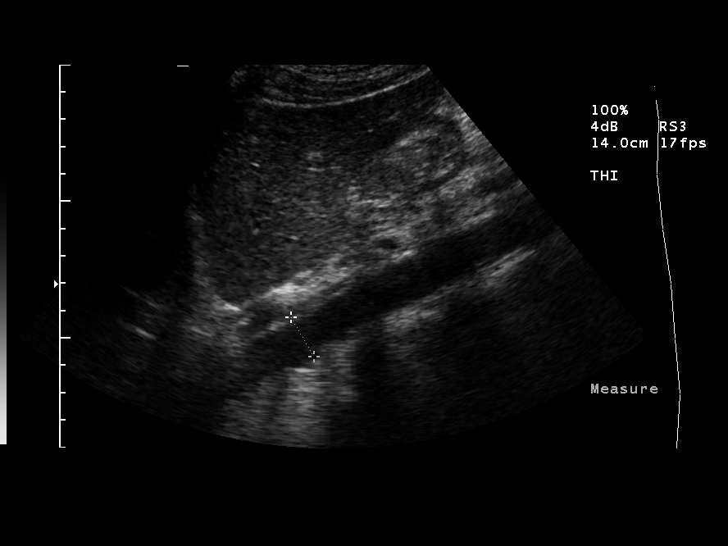
[im 12/68]
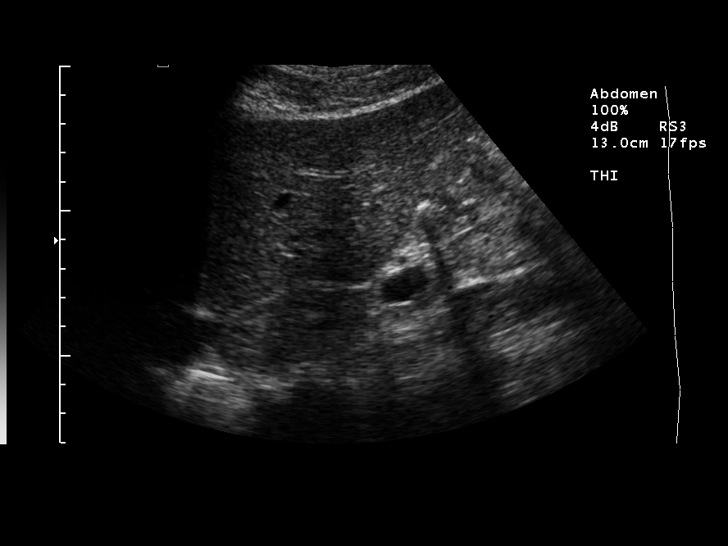
[im 17/68]
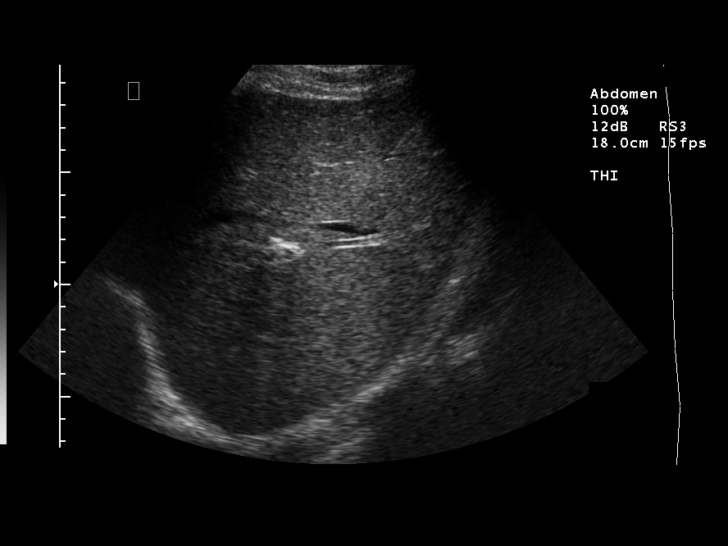
[im 23/68]
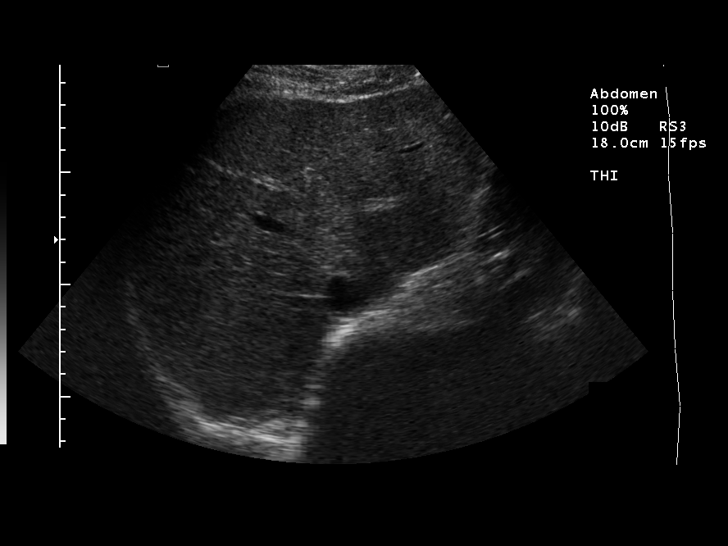
[im 26/68]
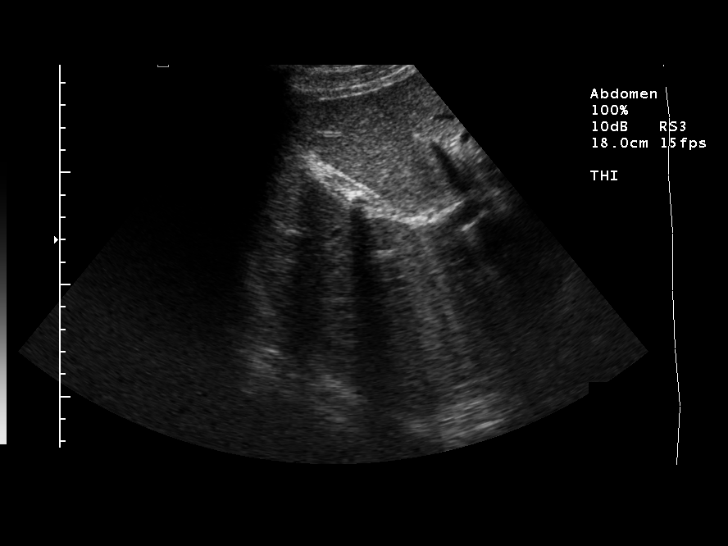
[im 31/68]
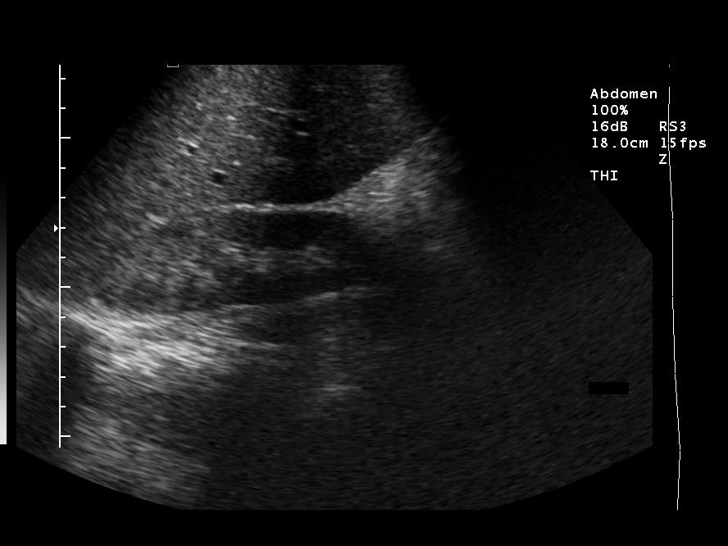
[im 37/68]
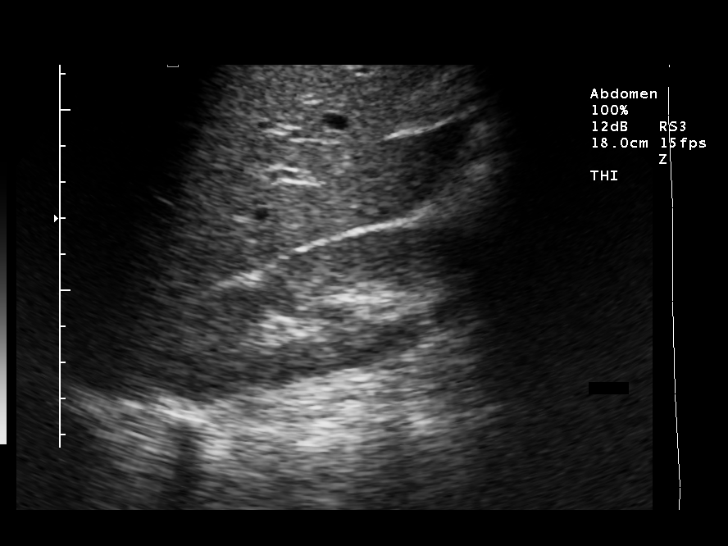
[im 42/68]
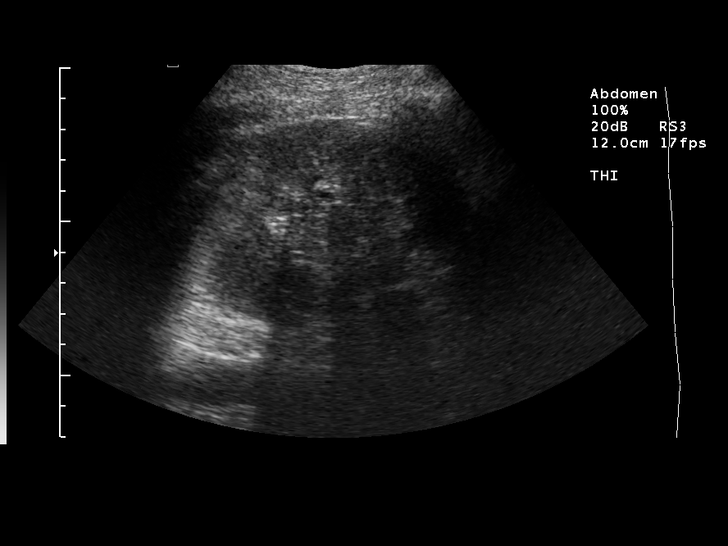
[im 45/68]
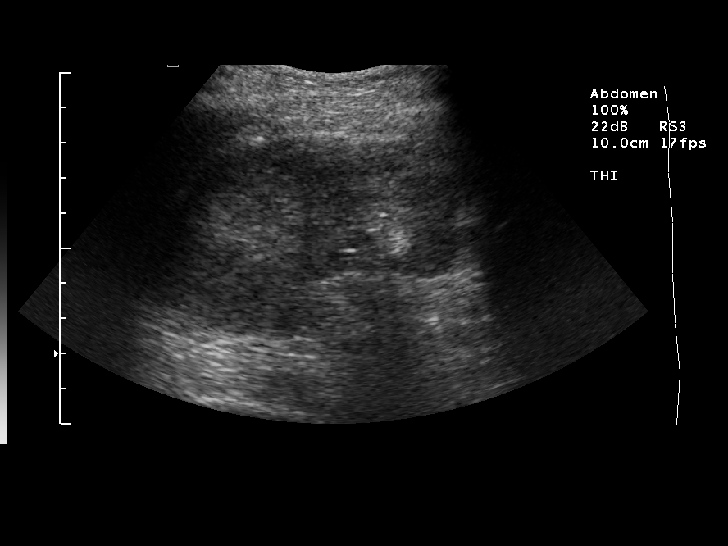
[im 51/68]
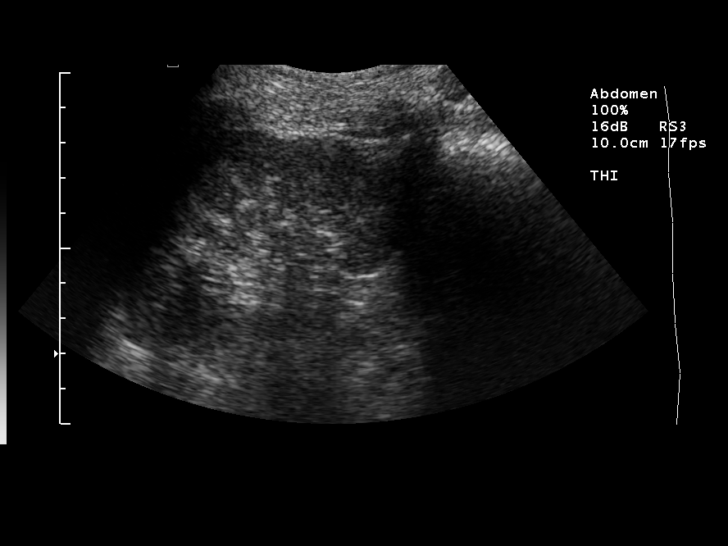
[im 56/68]
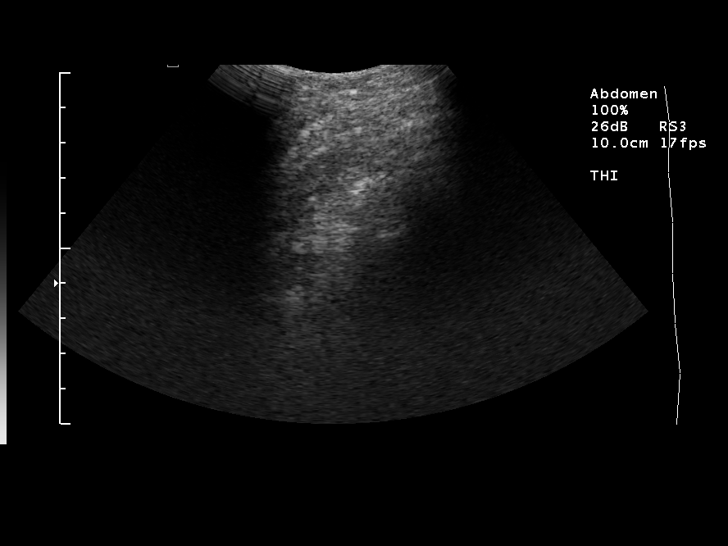
[im 62/68]
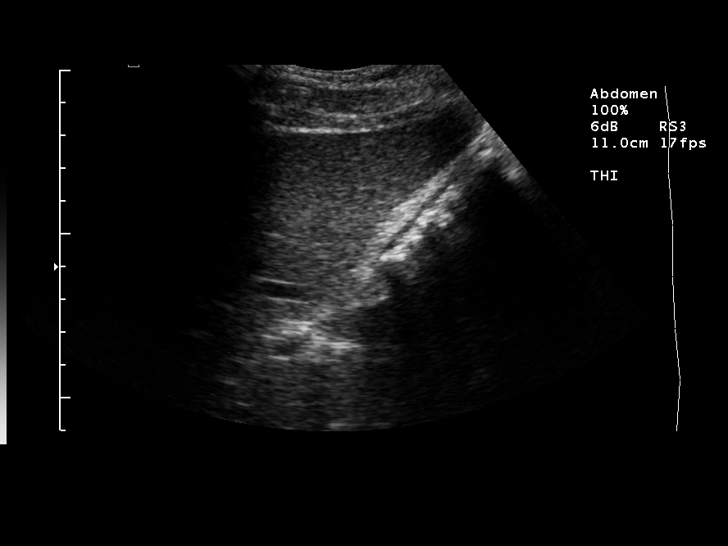
[im 68/68]
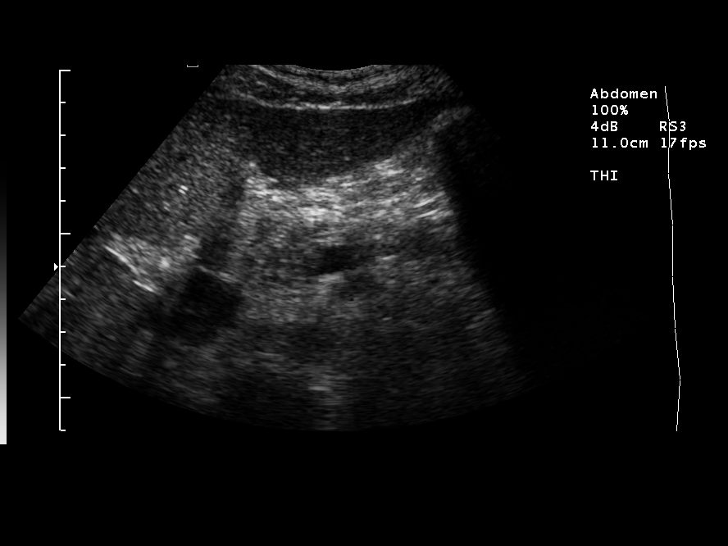

[14 of 25 positions shown; findings below may reference images not displayed]

FINDINGS: Gallbladder: Gallbladder is contracted around shadowing stones. This generates a
wall-echo-shadow sign. No pericholecystic fluid. Gallbladder wall thickness
cannot be reliably measured in this setting, but is probably in the 2-3 mm
range. The sonographer reports no sonographic Murphy's sign.

Common Bile Duct:  Nondilated 2-3 mm in diameter.

Liver:  Normal

Inferior Vena Cava:  Normal

Pancreas:  Normal

Spleen:  Cannot be reliably visualized.

Right Kidney:  11.2 cm in long axis.  Normal

Left Kidney:  9.8 cm in long axis.  Normal

Aorta:  No aneurysm
IMPRESSION: Gallbladder contracted around gallstones. No definite gallbladder wall
thickening or evidence for pericholecystic fluid. The sonographer reports no
sonographic Murphy's sign.

Nonvisualization of the spleen.

## 2008-07-28 IMAGING — CT CT HEAD W/O CM
1 series · 15 of 30 positions shown, 19 images · IV contrast (agent unspecified)
Comparison: None.

CLINICAL DATA: 26-year-old with headaches and pain.  
HEAD CT WITHOUT CONTRAST:
TECHNIQUE: Contiguous axial images were obtained from the base of the skull through the vertex according to standard protocol without contrast.

[Series 2: brain · axial · 0.47mm/px · z∈[+133,+280]mm · 15 of 48 slices shown, 19 images]
[im 2/48  brain]
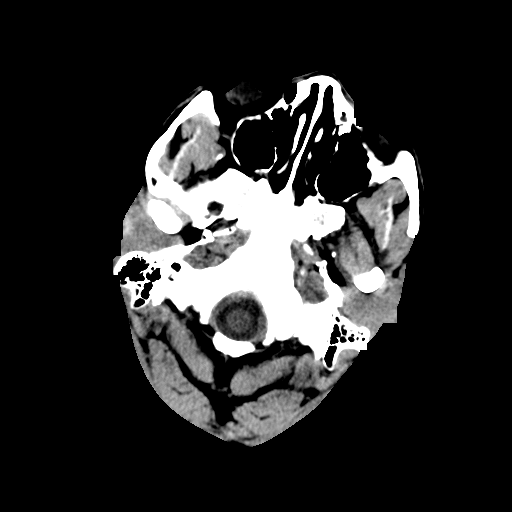
[im 2/48  bone]
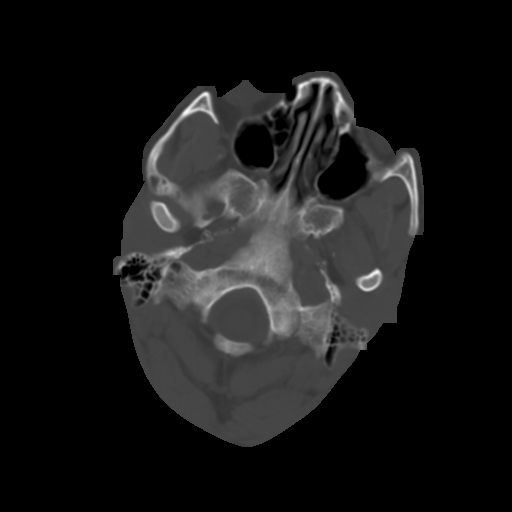
[im 5/48  brain]
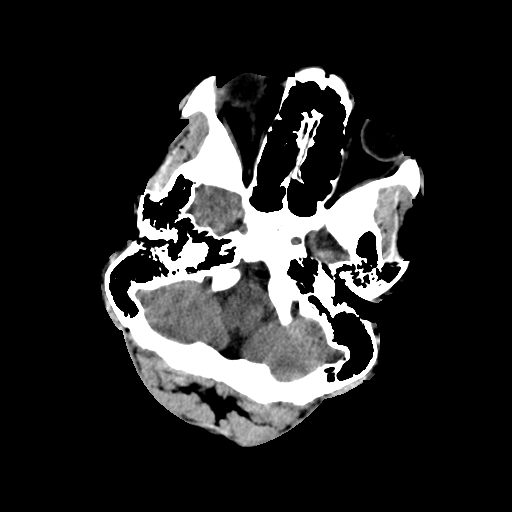
[im 9/48  brain]
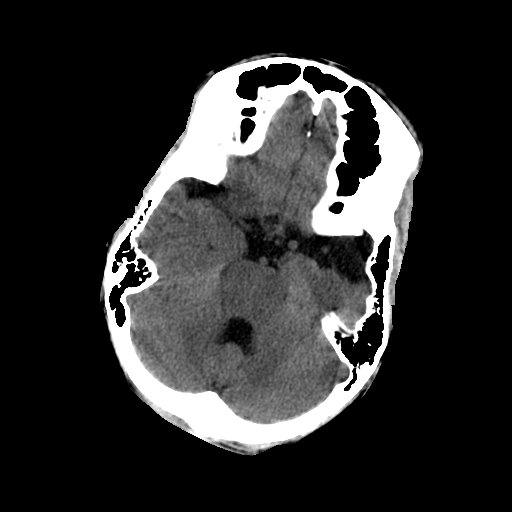
[im 12/48  brain]
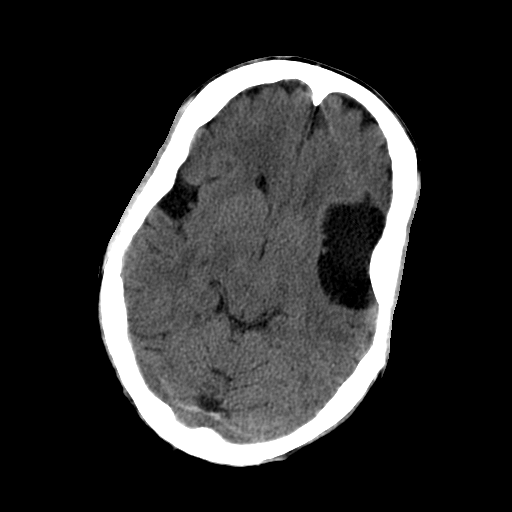
[im 15/48  brain]
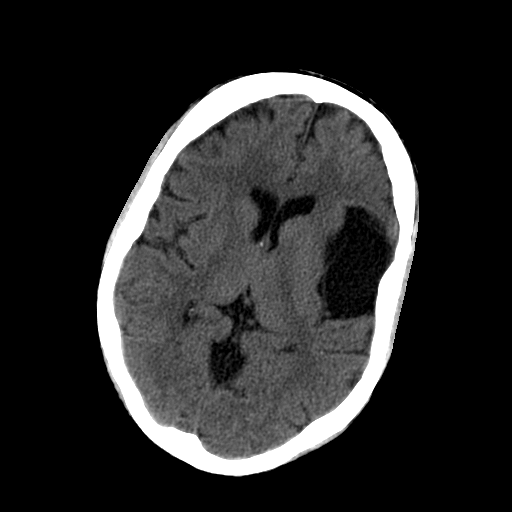
[im 15/48  bone]
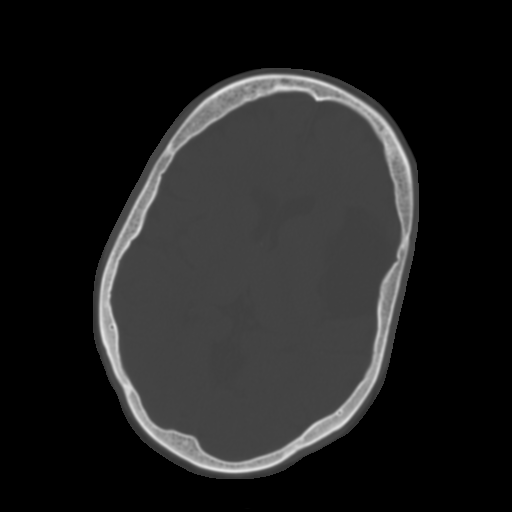
[im 18/48  brain]
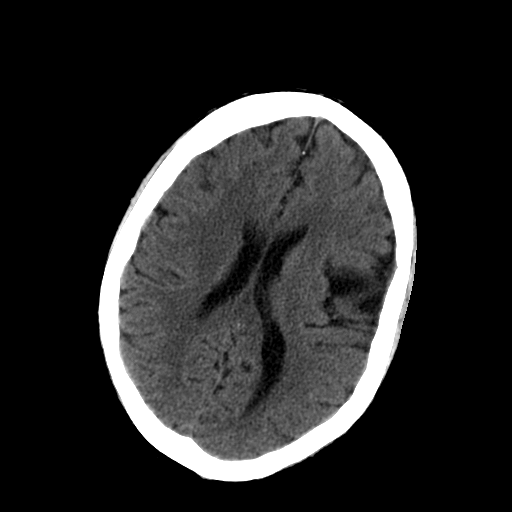
[im 22/48  brain]
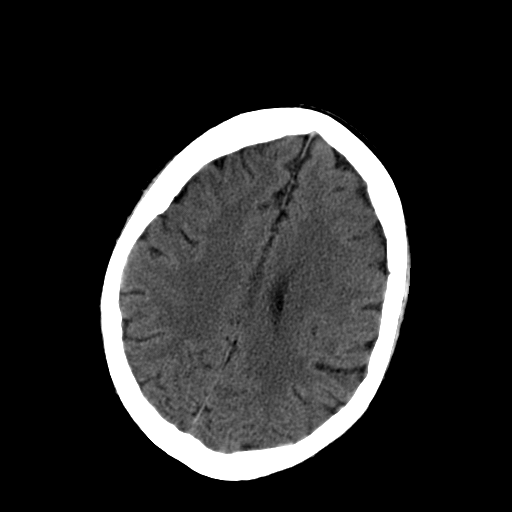
[im 25/48  brain]
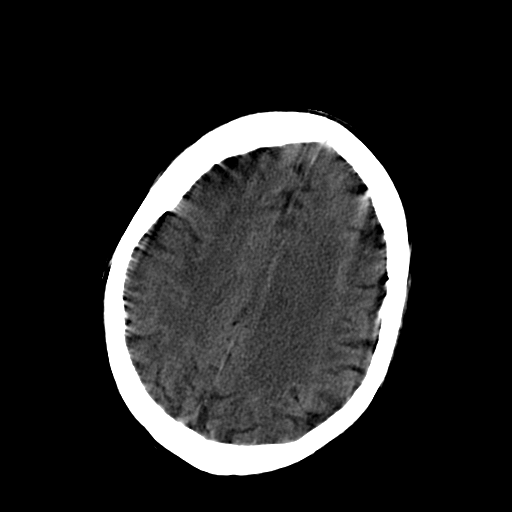
[im 26/48  brain]
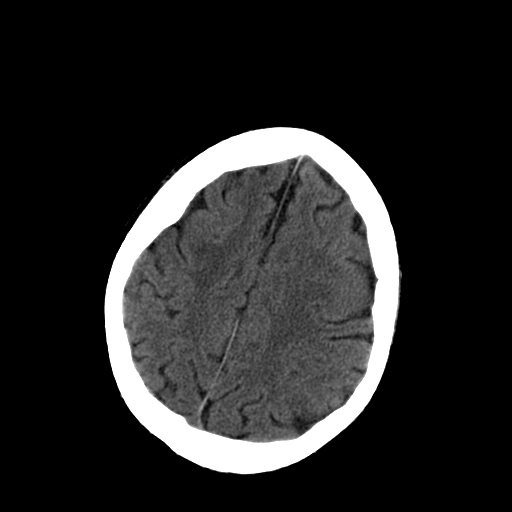
[im 26/48  bone]
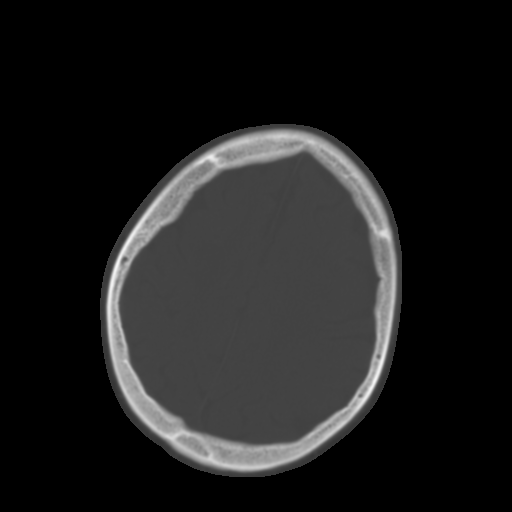
[im 30/48  brain]
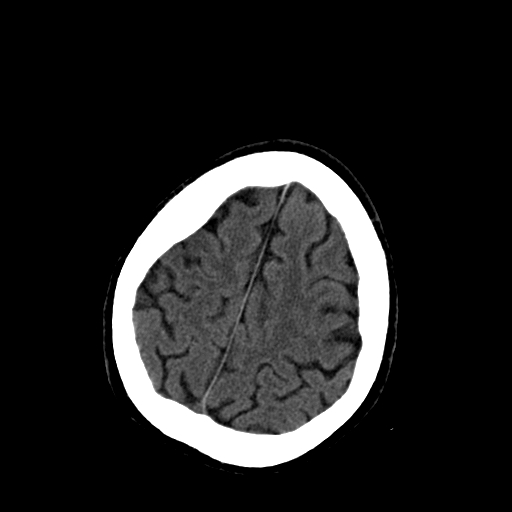
[im 33/48  brain]
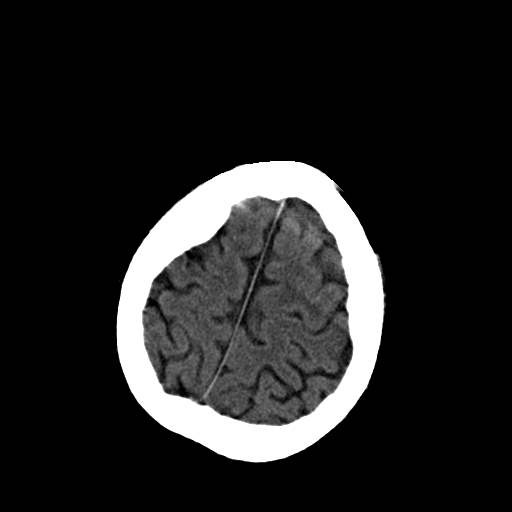
[im 36/48  brain]
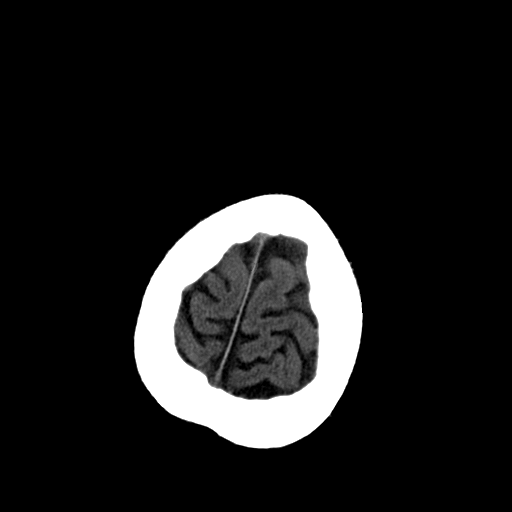
[im 39/48  brain]
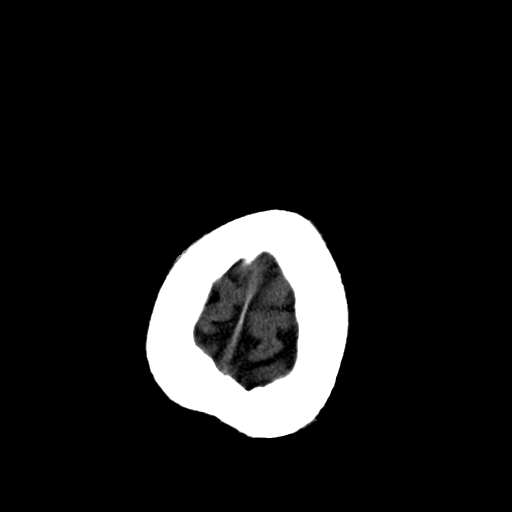
[im 39/48  bone]
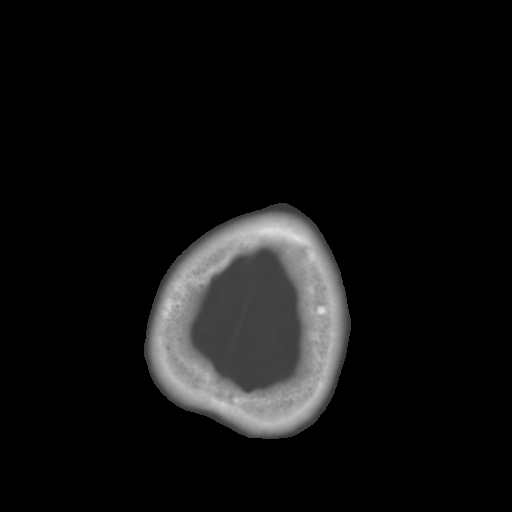
[im 43/48  brain]
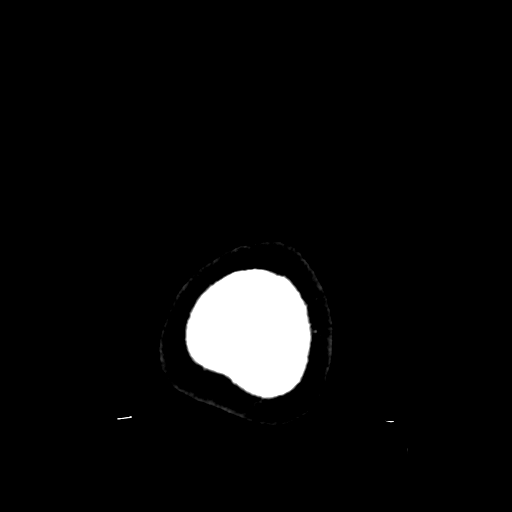
[im 46/48  brain]
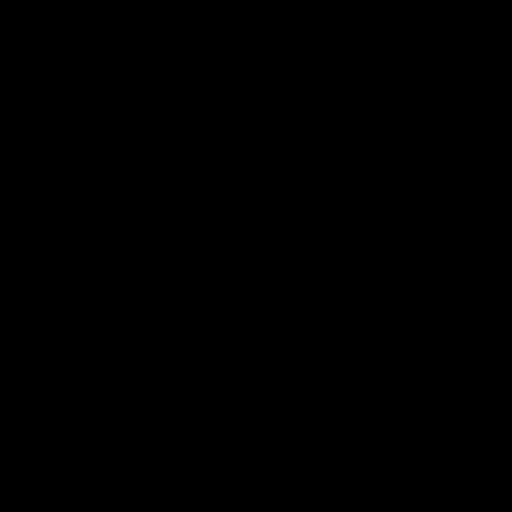

[15 of 30 positions shown; findings below may reference images not displayed]

FINDINGS: The patient has a large low density structure that probably originates from the left middle cranial fossa and extends along the left sylvian fissure.  The lesion measures up to 2.6 x 5.2 cm.  There is also increased CSF density along the right middle cranial fossa and right sylvian fissure, suggesting there may be a small lesion in this area.  Small amount of mass effect along the left anterior horn.  No evidence for acute hemorrhage or hydrocephalus.  The paranasal sinuses are clear.  No acute bony abnormality.
IMPRESSION: Large CSF attenuating lesion involving the left middle cranial fossa and extending into the left sylvian fissure.  Findings most likely represent an arachnoid cyst and the differential also includes an epidermoid.  Suspect there is a smaller CSF attenuating lesion on the right side in a similar location.  Small amount of mass effect related to the larger lesion.  Findings could be better evaluated with MRI.

## 2008-07-28 IMAGING — CR DG CHEST 2V
2 series · 2 of 2 positions shown · non-contrast
Comparison: 10/23/06.

CLINICAL DATA: Headache.  
 CHEST - 2 VIEW:

[w chest pa]
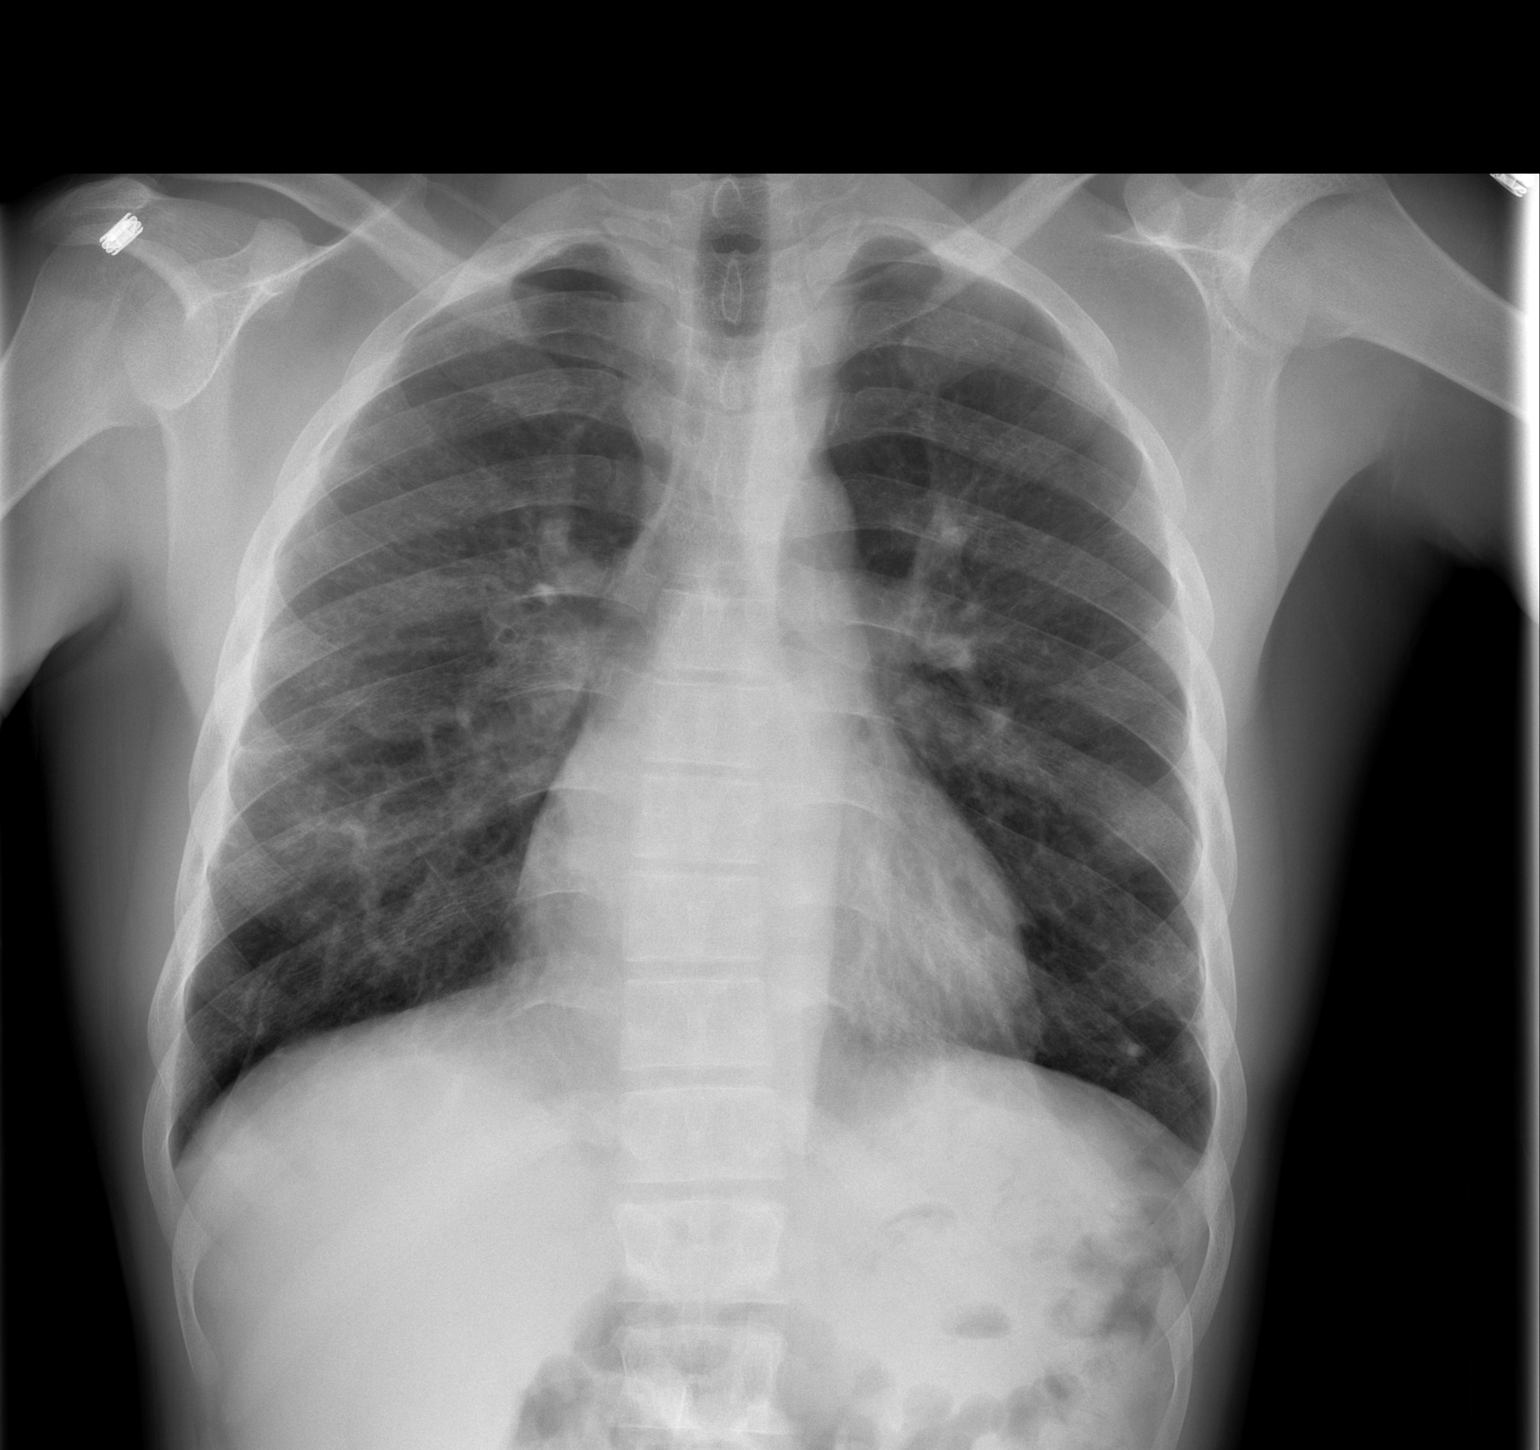

[w chest lat]
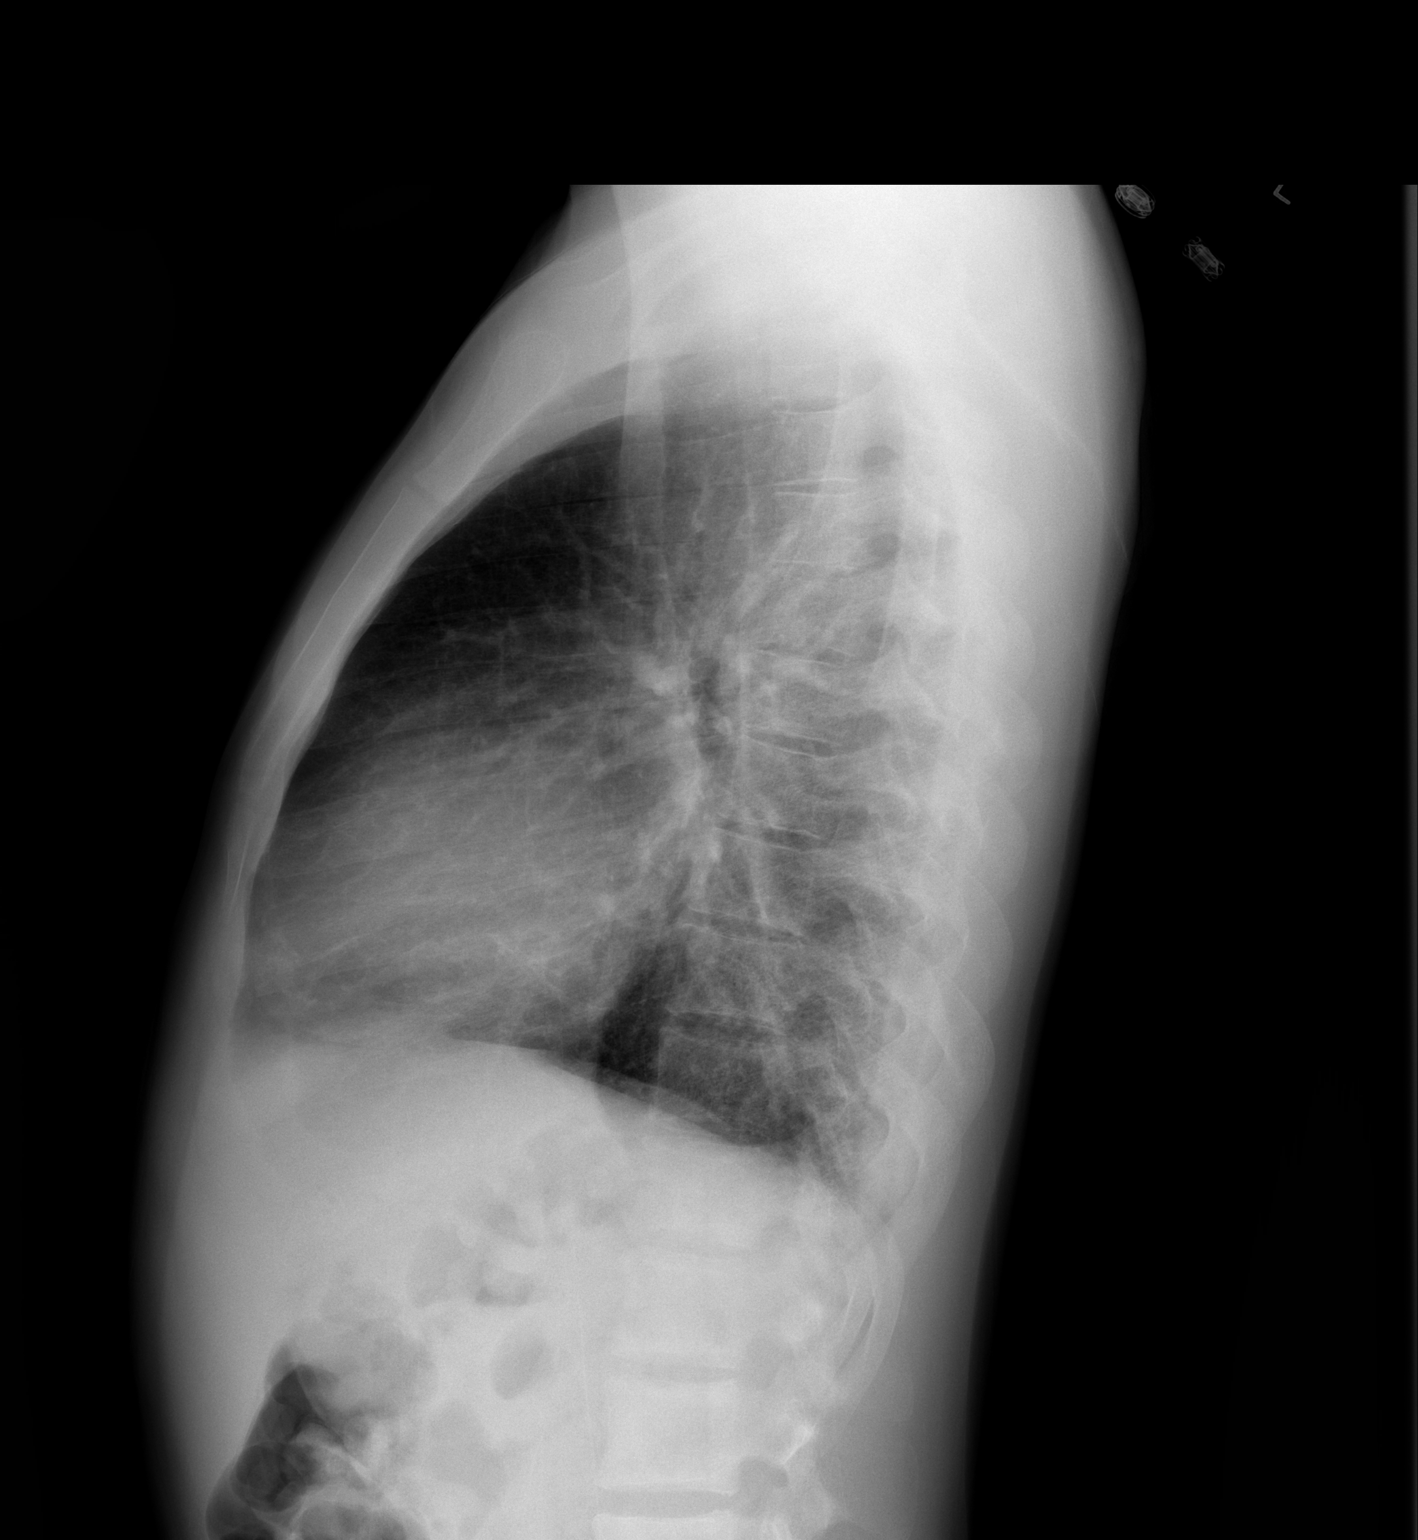

[2 of 2 positions shown; findings below may reference images not displayed]

FINDINGS: Heart size upper normal.  There are bronchitic changes with coarse lung markings.  The lungs are also hyperaerated.  No definite acute process.  Osseous structures intact.
IMPRESSION: Chronic lung changes ? no acute process.

## 2008-08-20 ENCOUNTER — Ambulatory Visit: Payer: Self-pay | Admitting: Family Medicine

## 2008-08-20 ENCOUNTER — Encounter: Payer: Self-pay | Admitting: Family Medicine

## 2008-09-23 ENCOUNTER — Ambulatory Visit: Payer: Self-pay | Admitting: Family Medicine

## 2008-09-23 ENCOUNTER — Encounter: Payer: Self-pay | Admitting: Family Medicine

## 2008-09-23 LAB — CONVERTED CEMR LAB
Eosinophils Absolute: 0.2 10*3/uL (ref 0.0–0.7)
HCT: 23 % — ABNORMAL LOW (ref 39.0–52.0)
Lymphs Abs: 3.9 10*3/uL (ref 0.7–4.0)
Monocytes Absolute: 0.5 10*3/uL (ref 0.1–1.0)
Monocytes Relative: 5 % (ref 3–12)
Neutro Abs: 5.7 10*3/uL (ref 1.7–7.7)
Neutrophils Relative %: 56 % (ref 43–77)
RBC: 2.05 M/uL — ABNORMAL LOW (ref 4.22–5.81)
Retic Ct Pct: 4 % — ABNORMAL HIGH (ref 0.4–3.1)
WBC: 10.3 10*3/uL (ref 4.0–10.5)

## 2008-10-26 ENCOUNTER — Encounter: Admission: RE | Admit: 2008-10-26 | Discharge: 2008-10-26 | Payer: Self-pay | Admitting: *Deleted

## 2008-10-26 ENCOUNTER — Ambulatory Visit: Payer: Self-pay | Admitting: Family Medicine

## 2008-10-27 ENCOUNTER — Ambulatory Visit: Payer: Self-pay | Admitting: Family Medicine

## 2008-10-27 ENCOUNTER — Encounter: Payer: Self-pay | Admitting: Family Medicine

## 2008-10-27 LAB — CONVERTED CEMR LAB
HCT: 26.5 % — ABNORMAL LOW (ref 39.0–52.0)
Hemoglobin: 8.9 g/dL — ABNORMAL LOW (ref 13.0–17.0)
Lymphocytes Relative: 57 % — ABNORMAL HIGH (ref 12–46)
Lymphs Abs: 5.4 10*3/uL — ABNORMAL HIGH (ref 0.7–4.0)
Monocytes Absolute: 0.6 10*3/uL (ref 0.1–1.0)
Monocytes Relative: 6 % (ref 3–12)
Neutro Abs: 3.3 10*3/uL (ref 1.7–7.7)
RBC: 2.36 M/uL — ABNORMAL LOW (ref 4.22–5.81)
Retic Ct Pct: 5 % — ABNORMAL HIGH (ref 0.4–3.1)

## 2008-11-24 ENCOUNTER — Ambulatory Visit: Payer: Self-pay | Admitting: Family Medicine

## 2008-11-24 ENCOUNTER — Encounter: Payer: Self-pay | Admitting: Family Medicine

## 2008-11-24 LAB — CONVERTED CEMR LAB
Eosinophils Absolute: 0.1 10*3/uL (ref 0.0–0.7)
Eosinophils Relative: 1 % (ref 0–5)
HCT: 23.8 % — ABNORMAL LOW (ref 39.0–52.0)
Hemoglobin: 8.3 g/dL — ABNORMAL LOW (ref 13.0–17.0)
Lymphs Abs: 4.1 10*3/uL — ABNORMAL HIGH (ref 0.7–4.0)
Monocytes Absolute: 0.9 10*3/uL (ref 0.1–1.0)
Monocytes Relative: 9 % (ref 3–12)
Neutrophils Relative %: 49 % (ref 43–77)
RBC: 2.21 M/uL — ABNORMAL LOW (ref 4.22–5.81)
WBC: 10 10*3/uL (ref 4.0–10.5)

## 2008-12-21 ENCOUNTER — Ambulatory Visit: Payer: Self-pay | Admitting: Family Medicine

## 2008-12-21 ENCOUNTER — Encounter: Payer: Self-pay | Admitting: Family Medicine

## 2008-12-21 LAB — CONVERTED CEMR LAB
Basophils Relative: 0 % (ref 0–1)
Hemoglobin: 7.9 g/dL — ABNORMAL LOW (ref 13.0–17.0)
MCHC: 35.3 g/dL (ref 30.0–36.0)
Monocytes Absolute: 1 10*3/uL (ref 0.1–1.0)
Monocytes Relative: 11 % (ref 3–12)
Neutro Abs: 4.7 10*3/uL (ref 1.7–7.7)
RBC: 2.05 M/uL — ABNORMAL LOW (ref 4.22–5.81)
Retic Ct Pct: 4.9 % — ABNORMAL HIGH (ref 0.4–3.1)

## 2009-02-22 ENCOUNTER — Encounter: Payer: Self-pay | Admitting: Family Medicine

## 2009-02-22 ENCOUNTER — Ambulatory Visit: Payer: Self-pay | Admitting: Family Medicine

## 2009-02-22 LAB — CONVERTED CEMR LAB
Eosinophils Relative: 3 % (ref 0–5)
HCT: 24.8 % — ABNORMAL LOW (ref 39.0–52.0)
Hemoglobin: 8.8 g/dL — ABNORMAL LOW (ref 13.0–17.0)
Lymphocytes Relative: 48 % — ABNORMAL HIGH (ref 12–46)
Lymphs Abs: 5.4 10*3/uL — ABNORMAL HIGH (ref 0.7–4.0)
Monocytes Absolute: 0.9 10*3/uL (ref 0.1–1.0)
RDW: 17.3 % — ABNORMAL HIGH (ref 11.5–15.5)
WBC: 11.1 10*3/uL — ABNORMAL HIGH (ref 4.0–10.5)

## 2009-03-03 ENCOUNTER — Ambulatory Visit: Payer: Self-pay | Admitting: Family Medicine

## 2009-03-03 ENCOUNTER — Encounter: Payer: Self-pay | Admitting: Family Medicine

## 2009-03-03 LAB — CONVERTED CEMR LAB
Albumin: 5.1 g/dL (ref 3.5–5.2)
CO2: 16 meq/L — ABNORMAL LOW (ref 19–32)
Glucose, Bld: 97 mg/dL (ref 70–99)
LDH: 439 units/L — ABNORMAL HIGH (ref 94–250)
Potassium: 4.4 meq/L (ref 3.5–5.3)
Sodium: 136 meq/L (ref 135–145)
Total Protein: 7.7 g/dL (ref 6.0–8.3)

## 2009-03-06 ENCOUNTER — Ambulatory Visit: Payer: Self-pay | Admitting: Family Medicine

## 2009-03-06 ENCOUNTER — Encounter: Payer: Self-pay | Admitting: Family Medicine

## 2009-03-06 ENCOUNTER — Inpatient Hospital Stay (HOSPITAL_COMMUNITY): Admission: EM | Admit: 2009-03-06 | Discharge: 2009-03-08 | Payer: Self-pay | Admitting: Emergency Medicine

## 2009-03-06 DIAGNOSIS — N184 Chronic kidney disease, stage 4 (severe): Secondary | ICD-10-CM | POA: Insufficient documentation

## 2009-03-06 DIAGNOSIS — N189 Chronic kidney disease, unspecified: Secondary | ICD-10-CM

## 2009-04-28 ENCOUNTER — Encounter: Payer: Self-pay | Admitting: Family Medicine

## 2009-04-28 ENCOUNTER — Ambulatory Visit: Payer: Self-pay | Admitting: Family Medicine

## 2009-04-28 LAB — CONVERTED CEMR LAB
Basophils Absolute: 0 K/uL (ref 0.0–0.1)
Basophils Relative: 0 % (ref 0–1)
Eosinophils Absolute: 0.2 K/uL (ref 0.0–0.7)
Eosinophils Relative: 2 % (ref 0–5)
HCT: 24.2 % — ABNORMAL LOW (ref 39.0–52.0)
Hemoglobin: 8.4 g/dL — ABNORMAL LOW (ref 13.0–17.0)
Lymphocytes Relative: 43 % (ref 12–46)
Lymphs Abs: 4.5 K/uL — ABNORMAL HIGH (ref 0.7–4.0)
MCHC: 34.7 g/dL (ref 30.0–36.0)
MCV: 116.9 fL — ABNORMAL HIGH (ref 78.0–100.0)
Monocytes Absolute: 1.2 K/uL — ABNORMAL HIGH (ref 0.1–1.0)
Monocytes Relative: 11 % (ref 3–12)
Neutro Abs: 4.6 K/uL (ref 1.7–7.7)
Neutrophils Relative %: 44 % (ref 43–77)
Platelets: 263 K/uL (ref 150–400)
RBC: 2.07 M/uL — ABNORMAL LOW (ref 4.22–5.81)
RDW: 19.6 % — ABNORMAL HIGH (ref 11.5–15.5)
Retic Ct Pct: 3.6 % — ABNORMAL HIGH (ref 0.4–3.1)
WBC: 10.6 10*3/microliter — ABNORMAL HIGH (ref 4.0–10.5)

## 2009-05-19 ENCOUNTER — Emergency Department (HOSPITAL_COMMUNITY): Admission: EM | Admit: 2009-05-19 | Discharge: 2009-05-19 | Payer: Self-pay | Admitting: Emergency Medicine

## 2009-05-24 ENCOUNTER — Ambulatory Visit: Payer: Self-pay | Admitting: Family Medicine

## 2009-06-09 ENCOUNTER — Ambulatory Visit: Payer: Self-pay | Admitting: Family Medicine

## 2009-06-18 ENCOUNTER — Encounter: Admission: RE | Admit: 2009-06-18 | Discharge: 2009-09-16 | Payer: Self-pay | Admitting: Family Medicine

## 2009-09-03 ENCOUNTER — Ambulatory Visit: Payer: Self-pay | Admitting: Family Medicine

## 2009-09-28 IMAGING — CR DG CHEST 2V
2 series · 2 of 2 positions shown · non-contrast
Comparison: 02/21/2007

CLINICAL DATA: Sickle cell crisis.  Chest pain.

CHEST - 2 VIEW

[w chest pa]
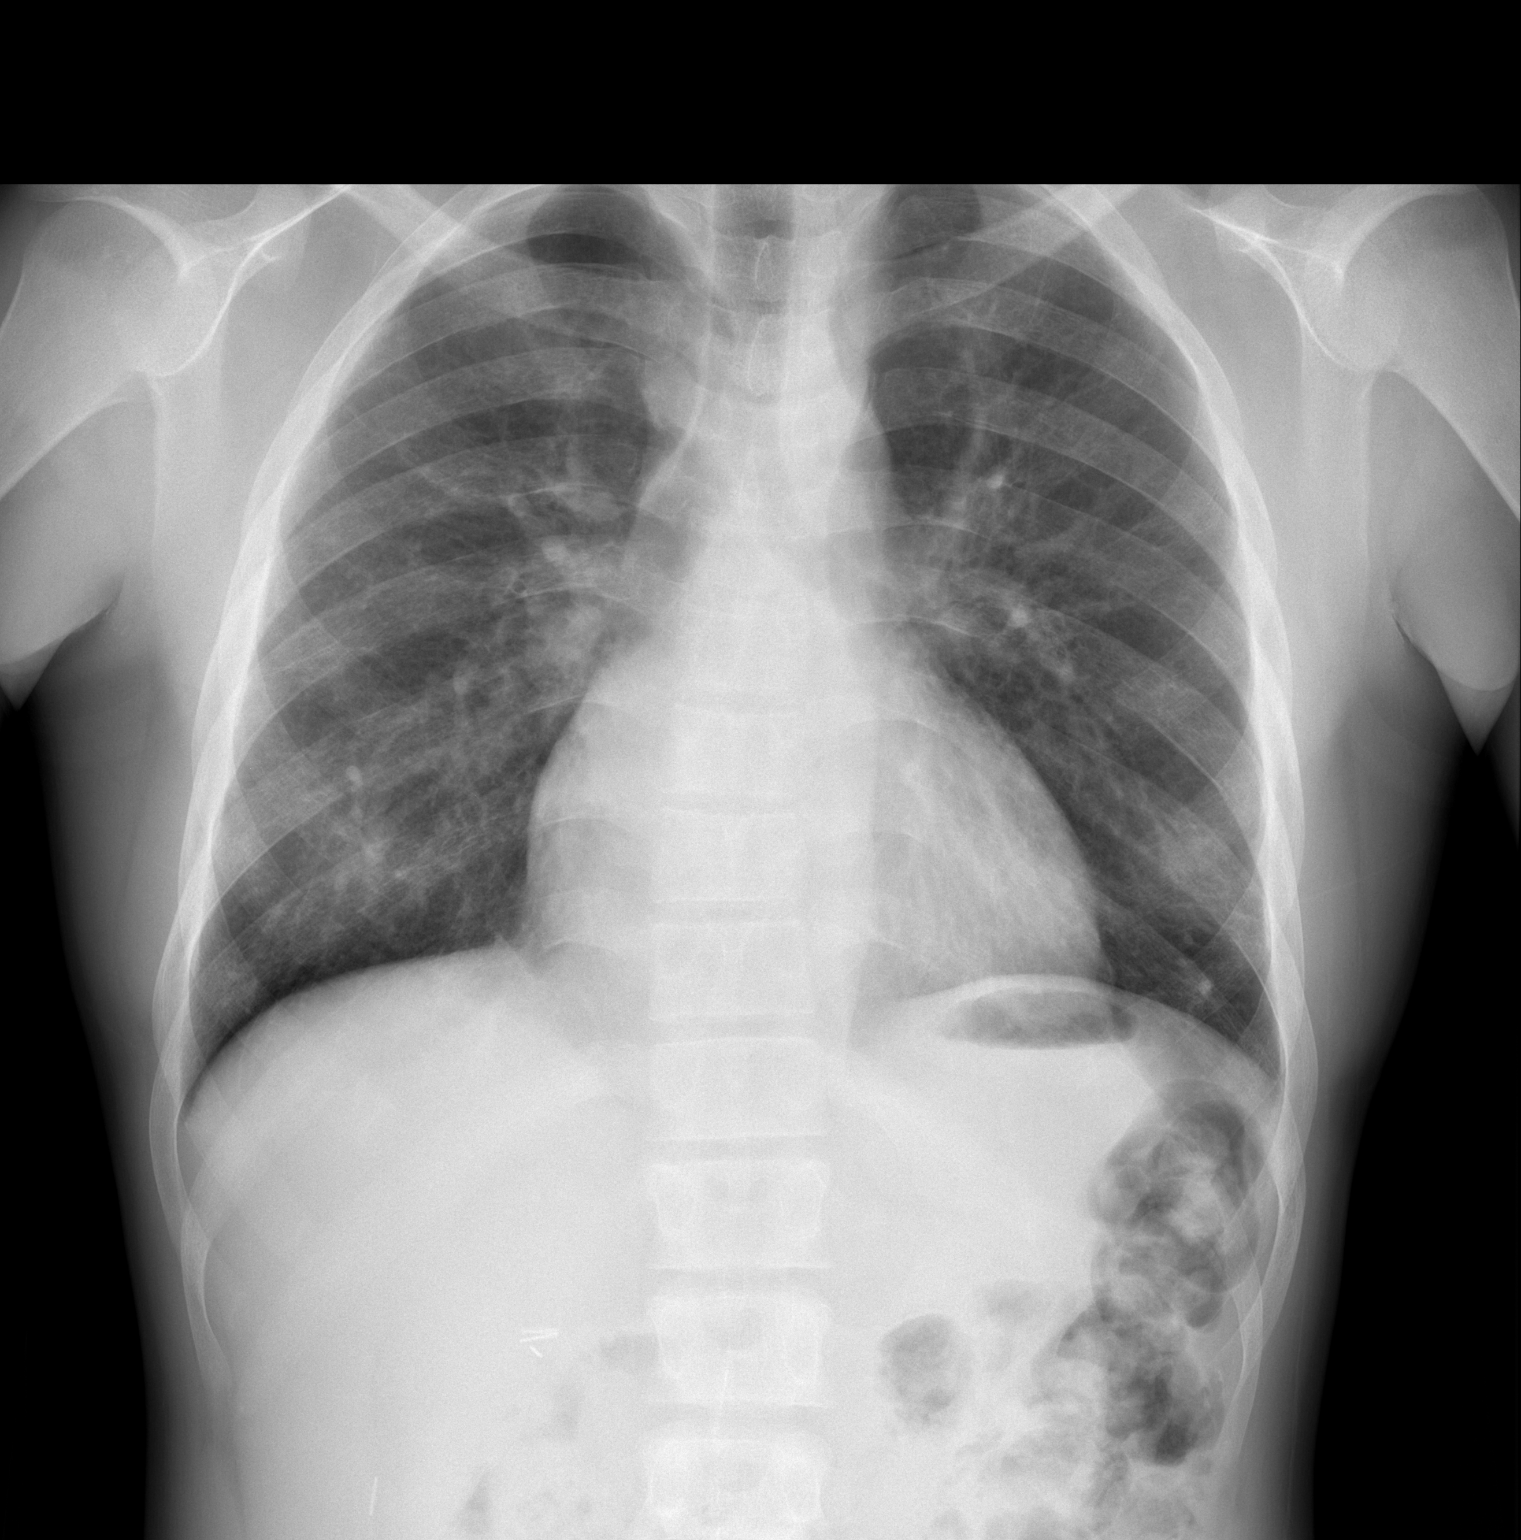

[w chest lat]
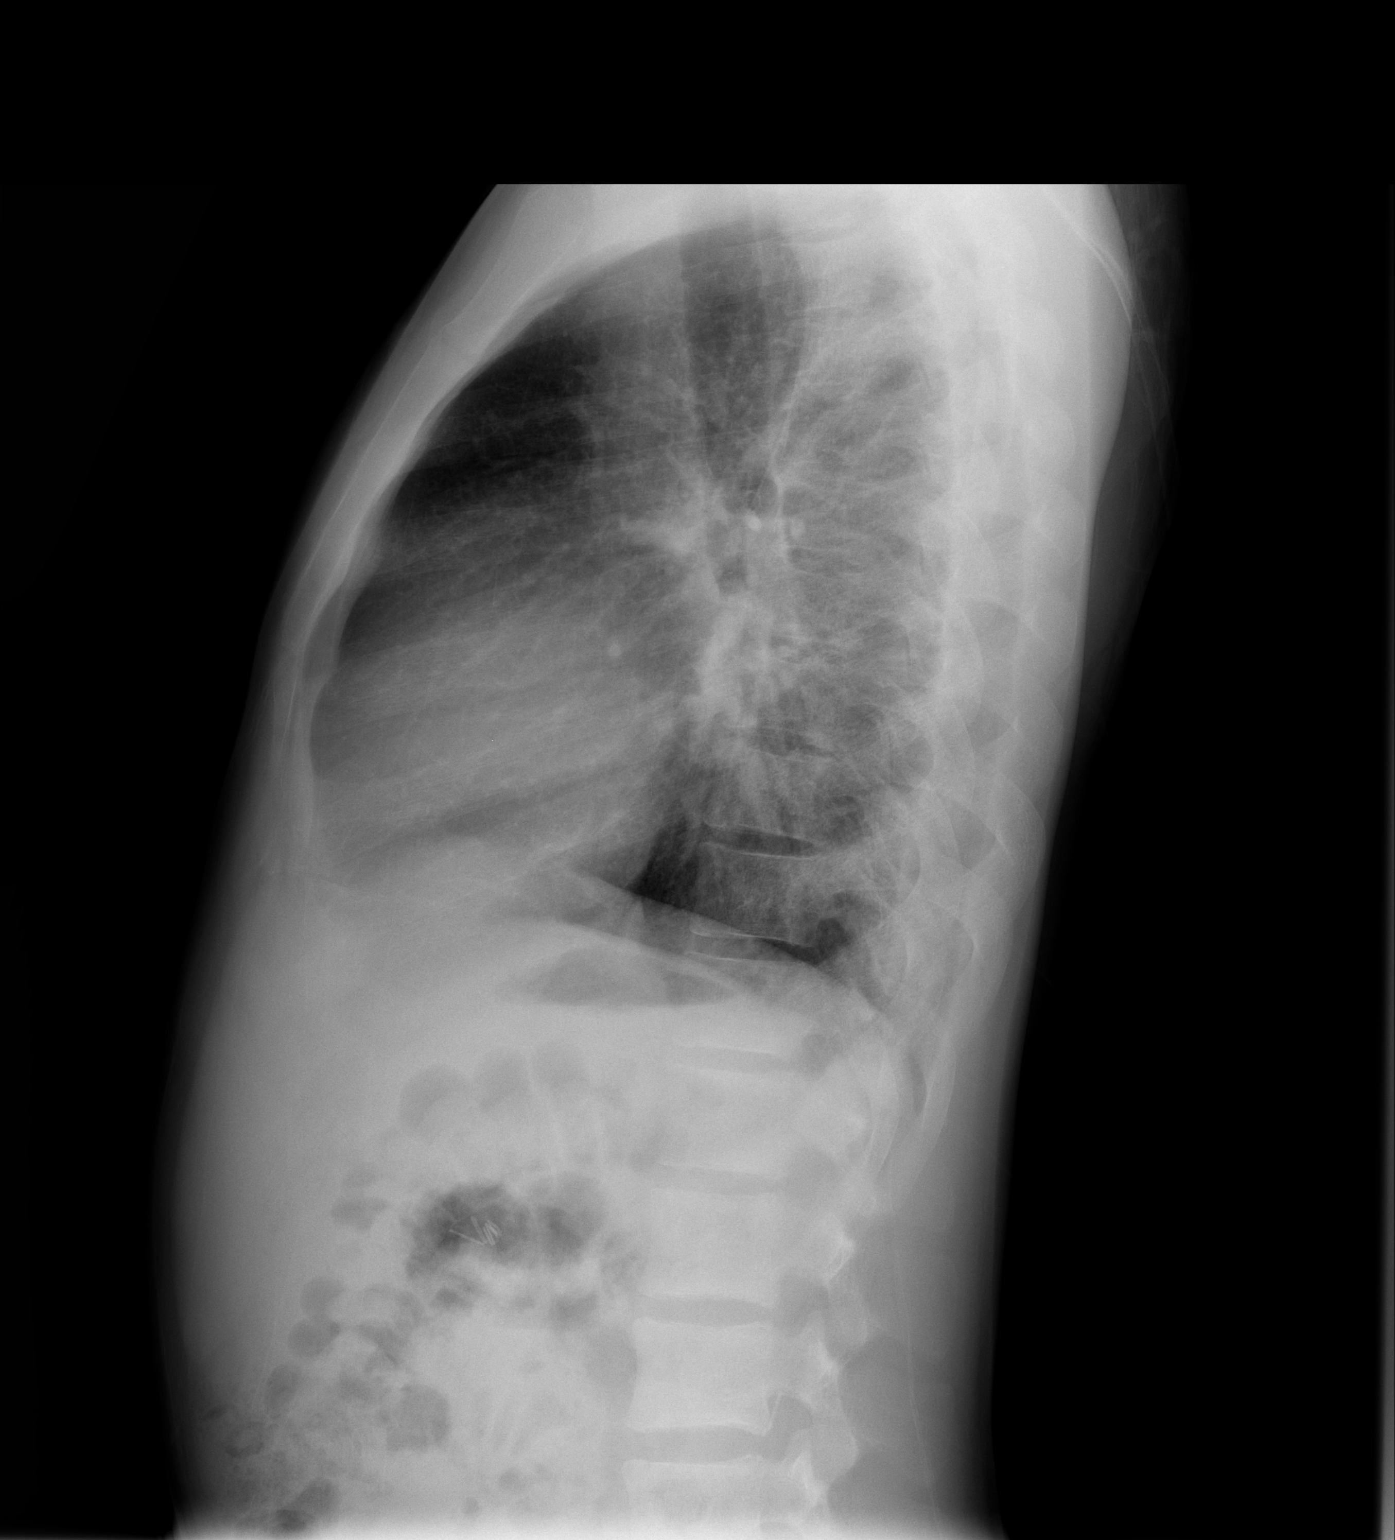

[2 of 2 positions shown; findings below may reference images not displayed]

FINDINGS: The heart is upper limits of normal in size.  There are
chronic lung changes with interstitial scarring.  No acute
pulmonary findings.  No pleural effusion.  The bony thorax is
intact.
IMPRESSION: Chronic interstitial lung changes.  No acute pulmonary process.

## 2009-09-29 IMAGING — CR DG CHEST 2V
2 series · 2 of 2 positions shown · non-contrast
Comparison: 04/23/2008 study

CLINICAL DATA: History given of chest pain and sickle cell anemia
crisis.

CHEST - 2 VIEW

[w chest pa]
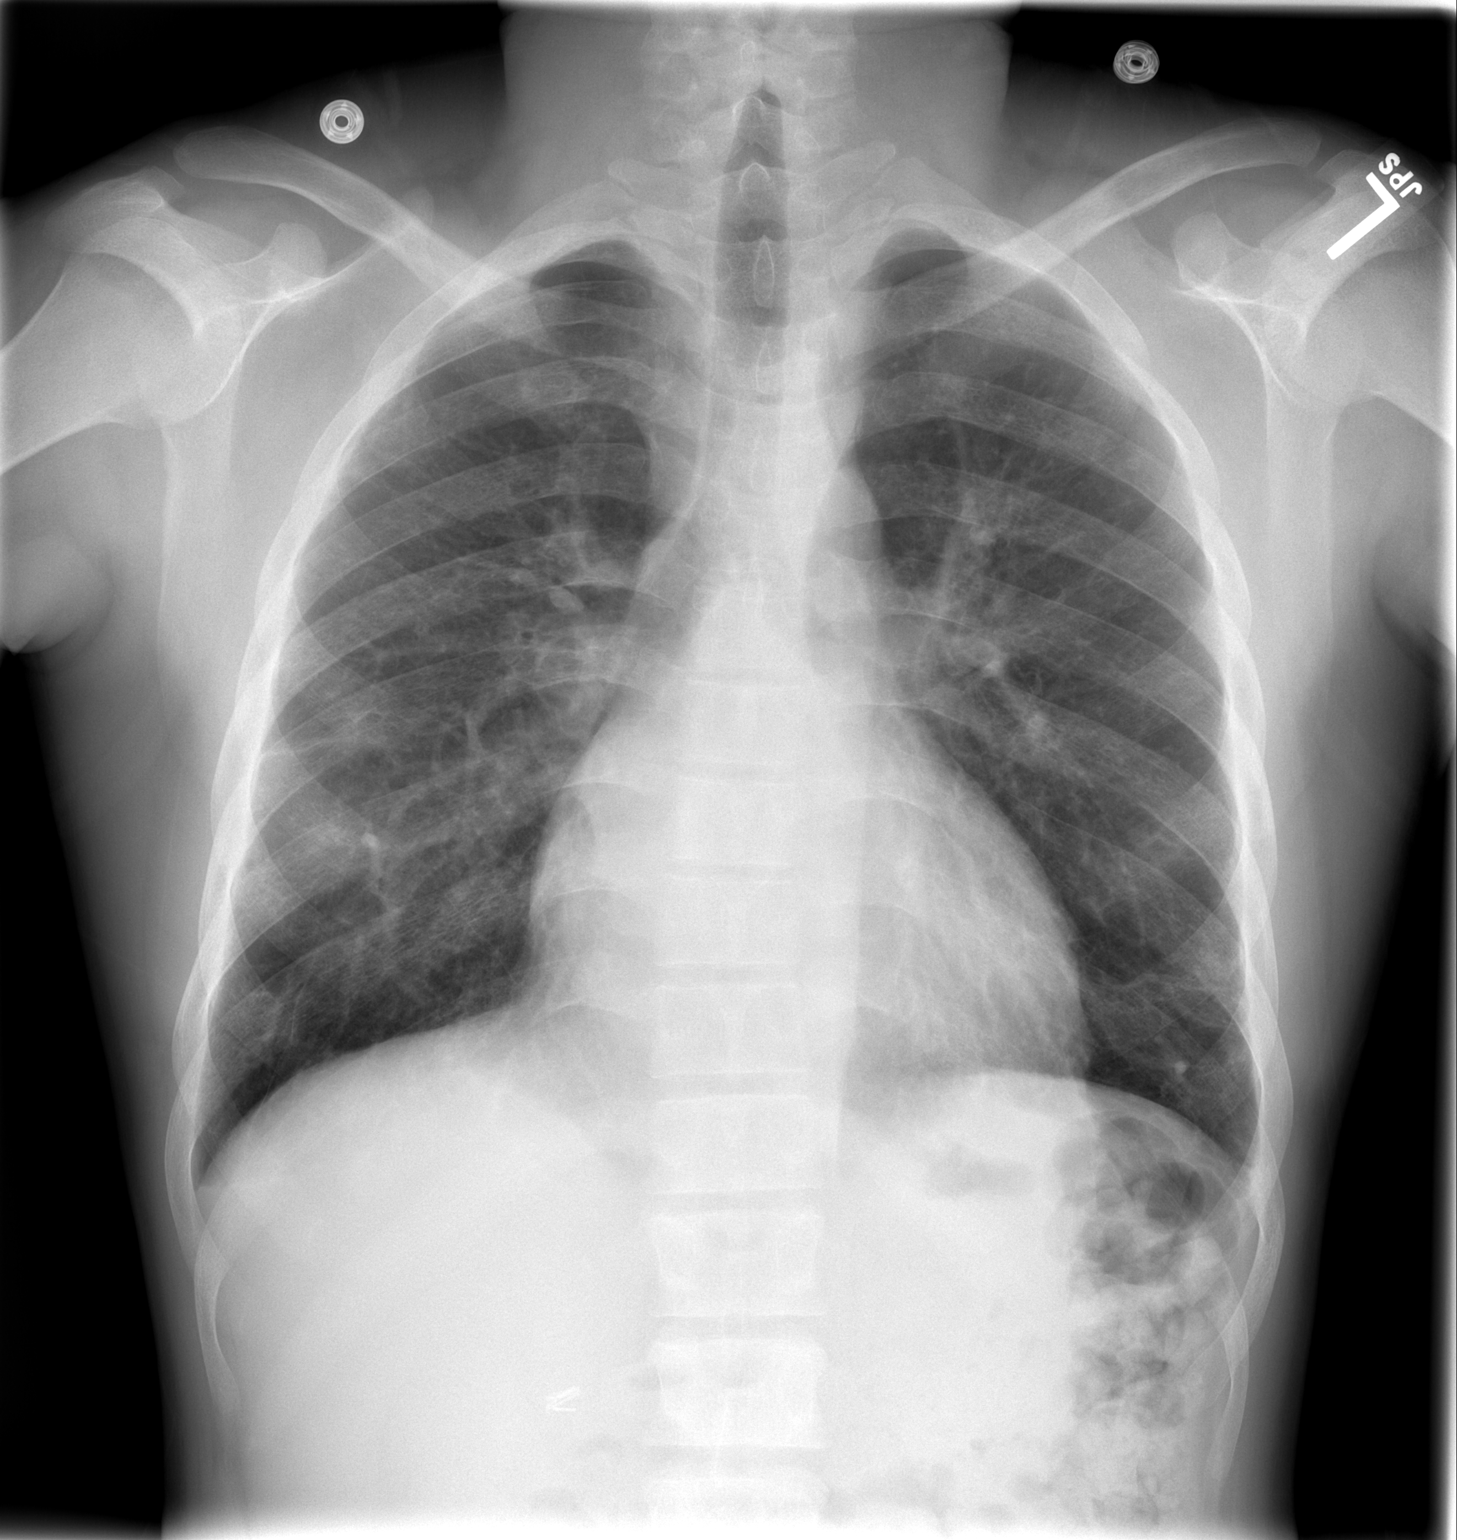

[w chest lat]
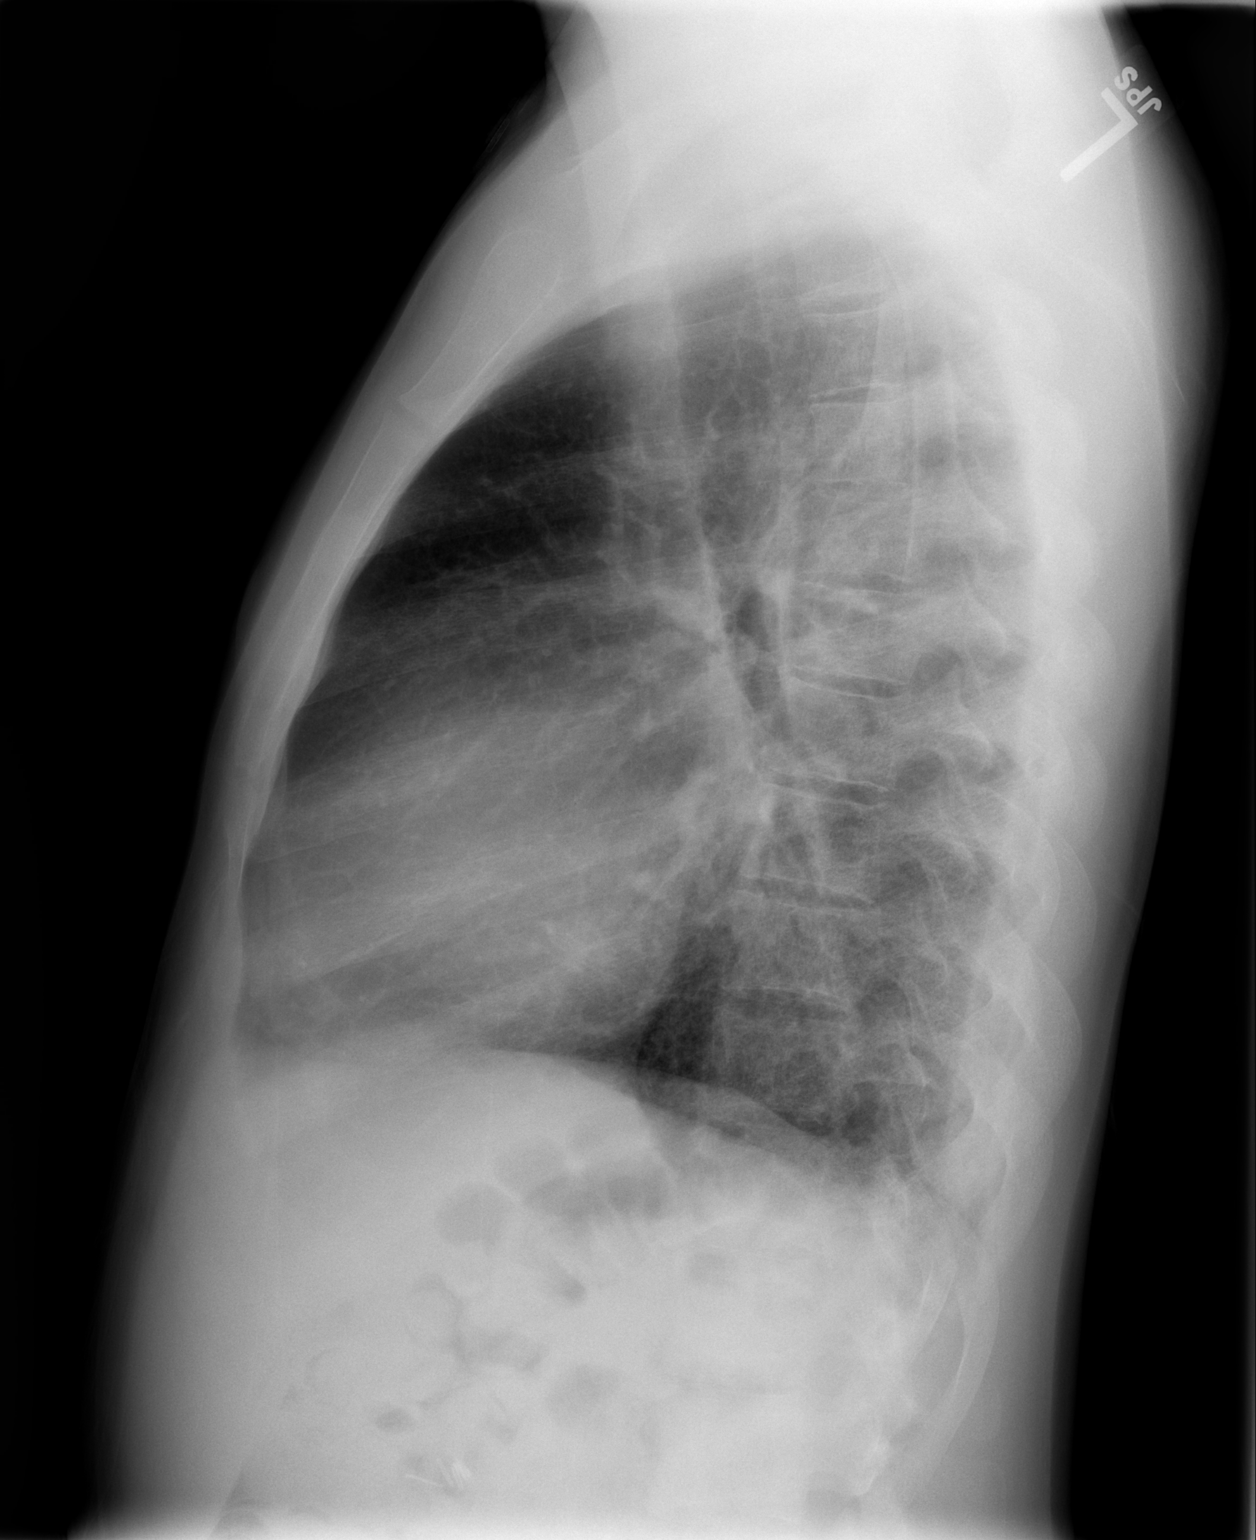

[2 of 2 positions shown; findings below may reference images not displayed]

FINDINGS: There is stable mild enlargement of the cardiac
silhouette.  No mediastinal or pleural abnormalities are seen.
Slight increase in reticular pulmonary markings appear stable
consistent with fibrotic change.  No pulmonary edema, pneumonia, or
pleural effusion is seen.  No bony lesions were evident.
IMPRESSION: There is stable mild enlargement of the cardiac silhouette and
reticular pulmonary findings.  No acute superimposed process is
identified.

## 2009-10-07 ENCOUNTER — Encounter (INDEPENDENT_AMBULATORY_CARE_PROVIDER_SITE_OTHER): Payer: Self-pay | Admitting: Internal Medicine

## 2009-10-07 ENCOUNTER — Inpatient Hospital Stay (HOSPITAL_COMMUNITY): Admission: EM | Admit: 2009-10-07 | Discharge: 2009-10-08 | Payer: Self-pay | Admitting: Emergency Medicine

## 2009-10-08 ENCOUNTER — Encounter: Payer: Self-pay | Admitting: Family Medicine

## 2009-10-22 ENCOUNTER — Emergency Department (HOSPITAL_COMMUNITY): Admission: EM | Admit: 2009-10-22 | Discharge: 2009-10-22 | Payer: Self-pay | Admitting: Emergency Medicine

## 2009-10-22 ENCOUNTER — Telehealth: Payer: Self-pay | Admitting: Family Medicine

## 2009-11-01 ENCOUNTER — Ambulatory Visit: Payer: Self-pay | Admitting: Family Medicine

## 2009-11-22 ENCOUNTER — Telehealth: Payer: Self-pay | Admitting: Family Medicine

## 2009-11-22 ENCOUNTER — Emergency Department (HOSPITAL_COMMUNITY): Admission: EM | Admit: 2009-11-22 | Discharge: 2009-11-22 | Payer: Self-pay | Admitting: Emergency Medicine

## 2009-11-24 ENCOUNTER — Telehealth: Payer: Self-pay | Admitting: Family Medicine

## 2009-11-24 ENCOUNTER — Telehealth: Payer: Self-pay | Admitting: *Deleted

## 2009-11-26 ENCOUNTER — Telehealth: Payer: Self-pay | Admitting: Family Medicine

## 2009-11-26 ENCOUNTER — Ambulatory Visit: Payer: Self-pay | Admitting: Family Medicine

## 2009-11-26 ENCOUNTER — Encounter: Payer: Self-pay | Admitting: Family Medicine

## 2009-11-26 DIAGNOSIS — G43909 Migraine, unspecified, not intractable, without status migrainosus: Secondary | ICD-10-CM | POA: Insufficient documentation

## 2009-11-26 LAB — CONVERTED CEMR LAB
MCV: 110.5 fL — ABNORMAL HIGH (ref 78.0–100.0)
Platelets: 258 10*3/uL (ref 150–400)
RDW: 21.3 % — ABNORMAL HIGH (ref 11.5–15.5)
WBC: 18.4 10*3/uL — ABNORMAL HIGH (ref 4.0–10.5)

## 2009-12-03 ENCOUNTER — Encounter: Payer: Self-pay | Admitting: Family Medicine

## 2009-12-17 ENCOUNTER — Encounter: Payer: Self-pay | Admitting: Family Medicine

## 2009-12-23 ENCOUNTER — Encounter: Payer: Self-pay | Admitting: Family Medicine

## 2009-12-23 ENCOUNTER — Ambulatory Visit: Payer: Self-pay | Admitting: Family Medicine

## 2009-12-23 ENCOUNTER — Inpatient Hospital Stay (HOSPITAL_COMMUNITY)
Admission: EM | Admit: 2009-12-23 | Discharge: 2009-12-29 | Payer: Self-pay | Source: Home / Self Care | Admitting: Emergency Medicine

## 2010-01-04 ENCOUNTER — Ambulatory Visit: Payer: Self-pay | Admitting: Family Medicine

## 2010-01-04 DIAGNOSIS — J159 Unspecified bacterial pneumonia: Secondary | ICD-10-CM | POA: Insufficient documentation

## 2010-01-04 DIAGNOSIS — D5700 Hb-SS disease with crisis, unspecified: Secondary | ICD-10-CM

## 2010-01-11 ENCOUNTER — Encounter (INDEPENDENT_AMBULATORY_CARE_PROVIDER_SITE_OTHER): Payer: Self-pay | Admitting: *Deleted

## 2010-01-20 ENCOUNTER — Emergency Department (HOSPITAL_COMMUNITY): Admission: EM | Admit: 2010-01-20 | Discharge: 2009-10-06 | Payer: Self-pay | Admitting: Emergency Medicine

## 2010-01-31 ENCOUNTER — Ambulatory Visit: Payer: Self-pay | Admitting: Family Medicine

## 2010-01-31 ENCOUNTER — Telehealth: Payer: Self-pay | Admitting: *Deleted

## 2010-01-31 ENCOUNTER — Encounter: Payer: Self-pay | Admitting: Family Medicine

## 2010-01-31 DIAGNOSIS — E039 Hypothyroidism, unspecified: Secondary | ICD-10-CM | POA: Insufficient documentation

## 2010-01-31 DIAGNOSIS — I739 Peripheral vascular disease, unspecified: Secondary | ICD-10-CM

## 2010-01-31 LAB — CONVERTED CEMR LAB
AST: 34 units/L (ref 0–37)
Alkaline Phosphatase: 125 units/L — ABNORMAL HIGH (ref 39–117)
BUN: 12 mg/dL (ref 6–23)
Creatinine, Ser: 1.26 mg/dL (ref 0.40–1.50)
Glucose, Bld: 89 mg/dL (ref 70–99)
HCT: 30.2 % — ABNORMAL LOW (ref 39.0–52.0)
Hemoglobin: 10.3 g/dL — ABNORMAL LOW (ref 13.0–17.0)
MCHC: 34.1 g/dL (ref 30.0–36.0)
RBC: 2.86 M/uL — ABNORMAL LOW (ref 4.22–5.81)
RDW: 20.6 % — ABNORMAL HIGH (ref 11.5–15.5)
Total Bilirubin: 3.6 mg/dL — ABNORMAL HIGH (ref 0.3–1.2)

## 2010-03-06 ENCOUNTER — Encounter: Payer: Self-pay | Admitting: Family Medicine

## 2010-03-10 ENCOUNTER — Encounter: Payer: Self-pay | Admitting: Sports Medicine

## 2010-03-10 ENCOUNTER — Ambulatory Visit
Admission: RE | Admit: 2010-03-10 | Discharge: 2010-03-10 | Payer: Self-pay | Source: Home / Self Care | Attending: Family Medicine | Admitting: Family Medicine

## 2010-03-10 LAB — CONVERTED CEMR LAB
Bilirubin Urine: NEGATIVE
Glucose, Urine, Semiquant: NEGATIVE
Ketones, urine, test strip: NEGATIVE
Nitrite: NEGATIVE
Protein, U semiquant: 30
Specific Gravity, Urine: 1.015
Urobilinogen, UA: 0.2
WBC Urine, dipstick: NEGATIVE
pH: 6

## 2010-03-14 ENCOUNTER — Ambulatory Visit
Admission: RE | Admit: 2010-03-14 | Discharge: 2010-03-14 | Payer: Self-pay | Source: Home / Self Care | Attending: Family Medicine | Admitting: Family Medicine

## 2010-03-14 ENCOUNTER — Inpatient Hospital Stay (HOSPITAL_COMMUNITY)
Admission: AD | Admit: 2010-03-14 | Discharge: 2010-03-15 | Disposition: A | Discharge status: Home / Self Care | Payer: Medicare Other | Attending: Family Medicine | Admitting: Family Medicine

## 2010-03-14 LAB — CONVERTED CEMR LAB
ALT: 19 units/L (ref 0–53)
AST: 35 units/L (ref 0–37)
Albumin: 5.2 g/dL (ref 3.5–5.2)
Alkaline Phosphatase: 128 U/L — ABNORMAL HIGH (ref 39–117)
BUN: 20 mg/dL (ref 6–23)
Basophils Absolute: 0 10*3/uL (ref 0.0–0.1)
Basophils Relative: 0 % (ref 0–1)
CO2: 21 meq/L (ref 19–32)
Calcium: 9.3 mg/dL (ref 8.4–10.5)
Chloride: 106 meq/L (ref 96–112)
Creatinine, Ser: 1.55 mg/dL — ABNORMAL HIGH (ref 0.40–1.50)
Eosinophils Absolute: 0.3 10*3/uL (ref 0.0–0.7)
Eosinophils Relative: 2 % (ref 0–5)
Glucose, Bld: 89 mg/dL (ref 70–99)
HCT: 27.6 % — ABNORMAL LOW (ref 39.0–52.0)
Hemoglobin: 9.8 g/dL — ABNORMAL LOW (ref 13.0–17.0)
Lymphocytes Relative: 44 % (ref 12–46)
Lymphs Abs: 5.7 K/uL — ABNORMAL HIGH (ref 0.7–4.0)
MCHC: 35.5 g/dL (ref 30.0–36.0)
MCV: 108.7 fL — ABNORMAL HIGH (ref 78.0–100.0)
Monocytes Absolute: 1.1 K/uL — ABNORMAL HIGH (ref 0.1–1.0)
Monocytes Relative: 8 % (ref 3–12)
Neutro Abs: 6 K/uL (ref 1.7–7.7)
Neutrophils Relative %: 46 % (ref 43–77)
Platelets: 222 10*3/uL (ref 150–400)
Potassium: 4.8 meq/L (ref 3.5–5.3)
RBC: 2.54 M/uL — ABNORMAL LOW (ref 4.22–5.81)
RDW: 23 % — ABNORMAL HIGH (ref 11.5–15.5)
Retic Ct Pct: 8.4 % — ABNORMAL HIGH (ref 0.4–3.1)
Sodium: 136 meq/L (ref 135–145)
Total Bilirubin: 6 mg/dL — ABNORMAL HIGH (ref 0.3–1.2)
Total Protein: 7.6 g/dL (ref 6.0–8.3)
WBC: 13 10*3/microliter — ABNORMAL HIGH (ref 4.0–10.5)

## 2010-03-15 DIAGNOSIS — D57819 Other sickle-cell disorders with crisis, unspecified: Secondary | ICD-10-CM

## 2010-03-15 LAB — RETICULOCYTES
RBC.: 3.1 MIL/uL — ABNORMAL LOW (ref 4.22–5.81)
Retic Count, Absolute: 644.8 10*3/uL — ABNORMAL HIGH (ref 19.0–186.0)

## 2010-03-15 LAB — COMPREHENSIVE METABOLIC PANEL
ALT: 26 U/L (ref 0–53)
AST: 52 U/L — ABNORMAL HIGH (ref 0–37)
Albumin: 3.7 g/dL (ref 3.5–5.2)
Alkaline Phosphatase: 97 U/L (ref 39–117)
BUN: 20 mg/dL (ref 6–23)
Chloride: 111 mEq/L (ref 96–112)
GFR calc Af Amer: 57 mL/min — ABNORMAL LOW (ref >60)
Potassium: 4.8 mEq/L (ref 3.5–5.1)
Sodium: 142 mEq/L (ref 135–145)
Total Bilirubin: 5.6 mg/dL — ABNORMAL HIGH (ref 0.3–1.2)
Total Protein: 6.5 g/dL (ref 6.0–8.3)

## 2010-03-15 LAB — CBC
HCT: 29.9 % — ABNORMAL LOW (ref 39.0–52.0)
Hemoglobin: 11.1 g/dL — ABNORMAL LOW (ref 13.0–17.0)
MCV: 96.5 fL (ref 78.0–100.0)
RDW: 23.1 % — ABNORMAL HIGH (ref 11.5–15.5)
WBC: 11.2 10*3/uL — ABNORMAL HIGH (ref 4.0–10.5)

## 2010-03-15 NOTE — Assessment & Plan Note (Signed)
Summary: bilat leg pain/hx sickle cell/[facility]/bmc   Vital Signs:  Patient profile:   30 year old male Height:      63 inches Weight:      120 pounds BMI:     21.33 BSA:     1.56 Temp:     98.5 degrees F Pulse rate:   97 / minute BP sitting:   109 / 64  Vitals Entered By: Christen Bame CMA (November 01, 2009 11:28 AM) CC: B leg pain and lower back pain Is Patient Diabetic? No Pain Assessment Patient in pain? yes     Location: legs and back Intensity: 6   Primary Care Provider:  Mylinda Latina MD  CC:  B leg pain and lower back pain.  History of Present Illness: 1) Sickle Cell Crisis: Sickle Cell disease (SS genotype) patient with 3-4 days of knee and lower back pain which started when weather became colder. Ran out of narcotic pain medications 3 weeks ago. Pain crises usually in legs (knees) and lower back. Pain crises usually occur with change in weather. Taking hydroxyurea daily. Planned follow up with [facility] on 11/17/09.    ROS: Denies chest pain, dyspnea, wheeze, knee swelling or immobility, nausea, vomiting or diarrhea, fever, chills focal neurological signs ,   Habits & Providers  Alcohol-Tobacco-Diet     Alcohol drinks/day: 0     Tobacco Status: quit     Tobacco Counseling: to quit use of tobacco products  Current Medications (verified): 1)  Enalapril Maleate 2.5 Mg  Tabs (Enalapril Maleate) .... 3 Tabs By Mouth Once Daily 2)  Fluticasone Propionate 50 Mcg/act  Susp (Fluticasone Propionate) .... 2 Sprays Into Each Nostril Daily For Relief of Rhinitis/sinusitis 3)  Hydroxyurea 500 Mg Caps (Hydroxyurea) .Marland Kitchen.. 1 Tab By Mouth Three Times A Day. 4)  Oxycontin 30 Mg Xr12h-Tab (Oxycodone Hcl) .... Take 1 Tab By Mouth Two Times A Day 5)  Flexeril 10 Mg Tabs (Cyclobenzaprine Hcl) .Marland Kitchen.. 1 By Mouth Three Times A Day As Needed 6)  Roxicodone 5 Mg Tabs (Oxycodone Hcl) .Marland Kitchen.. 1-2 By Mouth Q 4-6 As Needed 7)  Doxycycline Hyclate 100 Mg Caps (Doxycycline Hyclate) ....  One Tab By Mouth Two Times A Day X 10 Days  Allergies (verified): 1)  * Mandol  Past History:  Past Medical History: Last updated: 03/06/2009 h/o Priapism SS sickle cell - baseline hemoglbin 9.0 per mom, followed by [facility] Dr. Waldo Laine Constipation Proteinuria Renal Insufficiency Hx Acute chest last admitted for this in Oct 2007 - received 2 units PRBCs Has a history of mild cerebral edema at age 28-14 yo.   Also h/o small infarcts followed by CTs at [facility], last one around age 15.  Past Surgical History: Last updated: 03/06/2009 s/p cholecystectomy  Social History: Smoking Status:  quit  Physical Exam  General:  Well-developed,well-nourished,in no acute distress; alert,appropriate and cooperative throughout examination Eyes:  scleral icterus.   Mouth:  moist mucus membranes.   Neck:  no lymphadenopathy   Chest Wall:  non tender to palpation  Lungs:  normal respiratory effort, CTAB w/o wheeze or crackles Heart:  RRR, no murmurs Abdomen:  soft and non-tender.   Msk:  mild tender to palpation at lower lumbar, no joint swelling or warmth noted. full ROM at bilateral knees.  Pulses:  2+ radials  Extremities:  no lower extremity edema Neurologic:  alert & oriented X3.  cranial nerves II-XII intact.   Skin:  no rash    Impression &  Recommendations:  Problem # 1:  SICKLE CELL CRISIS (ICD-282.62) Assessment Deteriorated  Mild pain crisis, likely precipitated by change in weather. Morphine 4 mg today. Refilled pain medications. No signs of acute chest syndrome or other red flags. Follow up with [facility] then meet with new PCP.   Orders: [place]- Est Level  3 [id number])  Complete Medication List: 1)  Enalapril Maleate 2.5 Mg Tabs (Enalapril maleate) .... 3 tabs by mouth once daily 2)  Fluticasone Propionate 50 Mcg/act Susp (Fluticasone propionate) .... 2 sprays into each nostril daily for relief of rhinitis/sinusitis 3)  Hydroxyurea 500 Mg Caps  (Hydroxyurea) .Marland Kitchen.. 1 tab by mouth three times a day. 4)  Oxycontin 30 Mg Xr12h-tab (Oxycodone hcl) .... Take 1 tab by mouth two times a day 5)  Flexeril 10 Mg Tabs (Cyclobenzaprine hcl) .Marland Kitchen.. 1 by mouth three times a day as needed 6)  Roxicodone 5 Mg Tabs (Oxycodone hcl) .Marland Kitchen.. 1-2 by mouth q 4-6 as needed 7)  Doxycycline Hyclate 100 Mg Caps (Doxycycline hyclate) .... One tab by mouth two times a day x 10 days  Patient Instructions: 1)  Take your pain medications as prescribed for pain crisis 2)  Follow up with your Dr. (Dr. Tye Savoy) in one month (after you are seen at [place] 3)  If you notice chest pain, shortness of breath or other concerns, come back in.  Prescriptions: ROXICODONE 5 MG TABS (OXYCODONE HCL) 1-2 by mouth q 4-6 as needed  #30 x 0   Entered and Authorized by:   Mariana Arn  MD   Signed by:   Mariana Arn  MD on 11/01/2009   Method used:   Print then Give to Patient   RxID:   [id number] OXYCONTIN 30 MG XR12H-TAB (OXYCODONE HCL) Take 1 tab by mouth two times a day  #30 x 0   Entered and Authorized by:   Mariana Arn  MD   Signed by:   Mariana Arn  MD on 11/01/2009   Method used:   Print then Give to Patient   RxID:   [id number]   Appended Document: bilat leg pain/hx sickle cell/[facility]/bmc   Medication Administration  Injection # 1:    Medication: Morphine Sulfate inj 10 mg    Diagnosis: SICKLE CELL ANEMIA (ICD-282.60)    Route: IM    Site: LUOQ gluteus    Exp Date: 11/14/2010    Lot #: [id number]    Mfr: hospira    Comments: 4mg  given    Patient tolerated injection without complications    Given by: Christen Bame CMA (November 01, 2009 3:56 PM)  Orders Added: 1)  Morphine Sulfate inj 10 mg [[id number]]

## 2010-03-15 NOTE — Initial Assessments (Signed)
Summary: Pain crisis - DC from ED   Vital Signs:  Patient profile:   30 year old male O2 Sat:      98 % on 2 L/min Temp:     97.9 degrees F Pulse rate:   78 / minute Resp:     22 per minute BP sitting:   103 / 60  Primary Provider:  Mylinda Latina MD   History of Present Illness: The patient is a 30 year old male with h/o SCA disease with multiple pain crisis who presents today with back pain that has been unresponsive to his home oxycodone.  The pain is located in his back and legs, is sharp in character, and does not radiate.  It began this morning around 4am while the patient was sleeping.  He says it feels like his typical sicle cell pain.  He has not been feeling otherwise sick, has been afebrile, no abdominal pain, SOB, visual changes, or neurologic symptoms.  He has not responded to a single dose of IV dilauded followed by a single dose of IV fentenayl given in the ED or to a 1L fluid bolus.    Pt does admit to drinking one can of beer yesterday afternoon but has not smoked in the past 24 hours or used any illicit substances.  Upon further questioning the patient admits that this morning when his pain began he took his last oxycodone and came to the ED primarily because he was out of his pain medications.  Physical Exam  General:  Well-developed,well-nourished, mild disress  Head:  normocephalic and atraumatic.   Eyes:  pupils equal, pupils round, and pupils reactive to light.  EOMI Mouth:  good dentition.  MMM Neck:  no lymphadenopathy   Lungs:  normal respiratory effort, CTAB w/o wheeze or crackles Heart:  RRR, no murmurs Abdomen:  soft, non-tender, normal bowel sounds, and no distention.  No H/Smegaly Msk:  normal ROM, no joint tenderness, no joint swelling, and no joint warmth.  No pain on palpation of legs. Neurologic:  alert & oriented X3 and cranial nerves II-XII intact.   Skin:  no rash, several tatoos noted   Current Problems (verified): 1)  Migraine, Common   (ICD-346.10) 2)  Chronic Kidney Disease Stage II (MILD)  (ICD-585.2) 3)  Sickle Cell Anemia  (ICD-282.60) 4)  Tobacco Abuse  (ICD-305.1)  Medications Prior to Update: 1)  Enalapril Maleate 2.5 Mg  Tabs (Enalapril Maleate) .... 3 Tabs By Mouth Once Daily 2)  Fluticasone Propionate 50 Mcg/act  Susp (Fluticasone Propionate) .... 2 Sprays Into Each Nostril Daily For Relief of Rhinitis/sinusitis 3)  Hydroxyurea 500 Mg Caps (Hydroxyurea) .... 1500mg  By Mouth Once Daily 4)  Oxycontin 30 Mg Xr12h-Tab (Oxycodone Hcl) .... Take 1 Tab By Mouth Two Times A Day 5)  Promethazine Hcl 12.5 Mg Tabs (Promethazine Hcl) .Marland Kitchen.. 1 By Mouth Q 6hrs As Needed N/v  Allergies: 1)  * Mandol  Past History:  Past Medical History: Last updated: 03/06/2009 h/o Priapism SS sickle cell - baseline hemoglbin 9.0 per mom, followed by [facility] Dr. Waldo Laine Constipation Proteinuria Renal Insufficiency Hx Acute chest last admitted for this in Oct 2007 - received 2 units PRBCs Has a history of mild cerebral edema at age 39-12 yo.   Also h/o small infarcts followed by CTs at [facility], last one around age 71.  Past Surgical History: Last updated: 03/06/2009 s/p cholecystectomy  Family History: Last updated: 04/12/2006 Sickle cell trait in mother and father and  sister, otherwise neg., unsure of fam hx otherwise  Social History: Last updated: 04/23/2008 Lives with wife and mother  in [place]. Does smoke, denies drugs.  Occasinal alcohol.   Not currently working.  Review of Systems  The patient denies anorexia, fever, weight loss, weight gain, vision loss, decreased hearing, hoarseness, syncope, peripheral edema, prolonged cough, headaches, hemoptysis, abdominal pain, melena, hematochezia, severe indigestion/heartburn, and hematuria.    Labs: CBC: 15>8.1<250 Retic: 11.3% BMET: 141/4.1/115/21/13/1.26<109    Impression & Recommendations:  Problem # 1:  SICKLE CELL ANEMIA (ICD-282.60) Pt in  pain crisis.  Has responded well to IV fentanyl.  Admits that he mostly came to the ED because he ran out of pain meds.  Will provide a script for his home meds and instruct him to follow up in clinic within two weeks.  This was discussed with the patient and he was agreeable with this plan.  Problem # 2:  CHRONIC KIDNEY DISEASE STAGE II (MILD) (ICD-585.2) Cr is actually slightly better than it has been historically.  Will advise the patient to avoid NSAIDs and continue his home enalapril.  Follow up as an outpatient.  Problem # 3:  FEN/GI: Ad lib  Problem # 4:  Dispo: Home with follow up in clinic.  Complete Medication List: 1)  Enalapril Maleate 2.5 Mg Tabs (Enalapril maleate) .... 3 tabs by mouth once daily 2)  Fluticasone Propionate 50 Mcg/act Susp (Fluticasone propionate) .... 2 sprays into each nostril daily for relief of rhinitis/sinusitis 3)  Hydroxyurea 500 Mg Caps (Hydroxyurea) .... 1500mg  by mouth once daily 4)  Oxycontin 30 Mg Xr12h-tab (Oxycodone hcl) .... Take 1 tab by mouth two times a day 5)  Promethazine Hcl 12.5 Mg Tabs (Promethazine hcl) .Marland Kitchen.. 1 by mouth q 6hrs as needed n/v  Appended Document: Pain crisis - DC from ED R3 Addendum  Chief Complaint:  Pain PCP:  Mylinda Latina MD  History of Present Illness:      Agree with above R1 consult note.  Briefly, 30 yo male with sickle cell disease with worsening pain yesterday and today.   Pain was in both thigh bones.  No swelling, no fevers/chills, no HA, no CP, no SOB, no cough.He usually takes oxycontin 30mg  two times a day, ran out of pills and took 1/2 of his last pill (broke it in half).   The oxycontin was insufficient and he came to the ED by ambulance.  He received dilaudid x1 and fentanyl 38mcg x1 in the ED and we were called to admit.  Pt admits to 50% pain reduction and is seen and documented resting comfortably in bed.  He is amenable to refill on oxycontin and going home.  PMHx, SurgHx, FamHx, SocialHx,  Allergies:  See R1 note.  Physical Exam Vital signs:  See R1 note. General Appearance: well developed, well nourished, no acute distress, resting comfortably. Eyes: conjunctiva and lids normal, PERRL, EOMI Ears, Nose, Mouth, Throat: TM clear, nares clear, oral exam WNL Neck: supple, no lymphadenopathy, no thyromegaly, no JVD Respiratory: clear to auscultation and percussion, respiratory effort normal Cardiovascular: regular rate and rhythm, S1-S2, no murmur, rub or gallop, no bruits, peripheral pulses normal and symmetric, no cyanosis, clubbing, edema or varicosities Gastrointestinal: soft, non-tender; no hepatosplenomegaly, masses; active bowel sounds all quadrants Musculoskeletal: Knees, thighs, and hips unremarkable to inspection, palpation, ROM normal, no joint swelling, on warmth, no erythema. Skin: clear, good turgor, color WNL, no rashes, lesions, or ulcerations Neurologic: Drowsy but arousable and comfrotable, normal reflexes, normal strength,  sensation, and motion Other Exam: Labs per R1 note.  Impression & Recommendations:  1.  Sickle Pain Crisis Hemoglobin stable and at baseline. Pt's leukocytosis is similar to previous baseline values at the [facility]. No signs acute chest or infection. Retic count adequate. Pain controlled with fentanyl in ED and will send home with oxycontin for 2 weeks. Pt should have pain contract at [facility]. Cont Nortryptiline  2. HTN/CKD Cont Enalapril. BP well controlled.  3. FEN/GI:  HH regular diet, oral hydration.  4. Dispo:  Discussed with attending and EDP, pain controlled, will send pt home with 2 wks oxycontin and pt to fu at [facility] with PCP.    Appended Document: Pain crisis - DC from ED     Prescriptions: OXYCONTIN 30 MG XR12H-TAB (OXYCODONE HCL) Take 1 tab by mouth two times a day  #60 x 0   Entered by:   Aundria Mems MD   Authorized by:   Vickie Epley MD   Signed by:   Aundria Mems MD on 12/23/2009   Method used:    Handwritten   RxIDLQ:[id number]     Appended Document: Pain crisis - DC from ED R3 Addendum  Chief Complaint:  Pain crisis legs and back  PCP:  Mylinda Latina MD  History of Present Illness:      Agree with above R1 and R3 note with addition of following:  74M /ho sickle cell disease with worsening pain yesterday and today.   Pain in back and bilateral thighs. Denies swelling, fevers/chills, no HA, no CP, no SOB, no cough. Pain not relievd by home regimen oxycontin 30mg  two times a day. Has received dilaudid x1 and fentanyl - intially 50% reduction of pain and plan for discharge with oral pain meds and close follow up but pain worsened and has continued. Per ER staff patient is somewhat sleepy with pain medications, but wakes easily to answer questions.  PMHx, SurgHx, FamHx, SocialHx, Allergies:  See R1 note.  Physical Exam Vital signs:  See R1 note. General Appearance: uncomfortable, well nourished, NAD Eyes: PERRL, EOMI Mouth: moist membranes Neck: no lymphadenopathy or JVD  Respiratory: clear to auscultation bilaterally, respiratory effort normal Cardiovascular: regular rate and rhythm, S1-S2, no murmur, rub or gallop, no bruits, peripheral 2+ radials and pedals  Gastrointestinal: soft, non-tender; no masses; active bowel sounds all quadrants Musculoskeletal: Knees, thighs, and hips unremarkable to inspection, palpation, ROM normal, no joint swelling, no warmth, no erythema or edema Skin: clear, good turgor, color WNL, no rashes, lesions, or ulcerations Neurologic: Drowsy but wakes easily to questions, normal reflexes, normal strength, sensation, and motion, CN II-XII intact  Other Exam: Labs per R1 note.  Impression & Recommendations:  1.  Sickle Pain Crisis: Will admit for pain control given inability to control pain in ER. Wil lgiven Dilaudid 1 mg IV q2 hrs.  Will obtain CXR to rule out acute chest, though no signs of acute chest on history or exam.  Hemoglobin  stable and at baseline. Leukocytosis is similar to previous baseline. Reticulocyte count adequate - recheck in AM. Will also check CBC, CMET, LDH. Discussed with PCP that patient should have pain contract established when discharged.  2)  HTN: Normotensive: Continue Enalapril at home dose  3) CKD: Cr at baseline. Will follow. Continue ACE-I  4) FEN/GI: Will hydrate with NS @ 125 for now. Regular diet when taking by mouth.   5) PPX: Heparin for DVT PPX  6) Dispo: Observation status. D/C when pain controlled w/  close follow up with PCP.  ________________________________________________________________________   Appended Document: Pain crisis - DC from ED LVM for pt to call

## 2010-03-15 NOTE — Initial Assessments (Signed)
Summary: Sickle Cell crisis   Vital Signs:  Patient profile:   30 year old male O2 Sat:      95 % on Room air Temp:     98.4 degrees F Pulse rate:   109 / minute Pulse rhythm:   regular Resp:     16 per minute BP supine:   101 / 60  O2 Flow:  Room air  Primary Care Provider:  Mylinda Latina MD  CC:  sickle cell crisis.  History of Present Illness: Pt is 30 y/o male with Sickle Cell SS disease who presents with 2 days of back/neck pain similar to past pain crises. He reports that pain started yesterday. He tried home dose of oxycontin 40 mg without relief. after several hours of continued pain, he tried additional dose without relief. Presented to ED this a.m. Received several doses of IV dilaudid and benadryl without relief. He also received 2 L of IV fluids. No fever, SOB, chest pain, or trouble breathing. patient states dilaudid has just made him drowsy, but has not relieved pain.   Current Medications (verified): 1)  Enalapril Maleate 2.5 Mg  Tabs (Enalapril Maleate) .... 3 Tabs By Mouth Once Daily 2)  Fluticasone Propionate 50 Mcg/act  Susp (Fluticasone Propionate) .... 2 Sprays Into Each Nostril Daily For Relief of Rhinitis/sinusitis 3)  Hydroxyurea 500 Mg Caps (Hydroxyurea) .Marland Kitchen.. 1 Tab By Mouth Three Times A Day. 4)  Oxycontin 30 Mg Xr12h-Tab (Oxycodone Hcl) .... Take 1 Tab By Mouth Two Times A Day  Allergies (verified): 1)  * Mandol  Past History:  Past Medical History: h/o Priapism SS sickle cell - baseline hemoglbin 9.0 per mom, followed by [facility] Dr. Waldo Laine Constipation Proteinuria Renal Insufficiency Hx Acute chest last admitted for this in Oct 2007 - received 2 units PRBCs Has a history of mild cerebral edema at age [age] yo.   Also h/o small infarcts followed by CTs at [facility], last one around age 79.  Past Surgical History: s/p cholecystectomy  Family History: Reviewed history from 04/12/2006 and no changes required. Sickle cell  trait in mother and father and sister, otherwise neg., unsure of fam hx otherwise  Social History: Reviewed history from 04/23/2008 and no changes required. Lives with wife and mother  in [place]. Does smoke, denies drugs.  Occasinal alcohol.   Not currently working.  Physical Exam  General:  alert and underweight appearing, NAD. laying in bed Eyes:  icteric sclerae. PERRLA. EOMI Ears:  hearing grossly normalL ear normal.   Nose:  no rhinorrhea Mouth:  slightly dry mucous membranes.  Neck:  No deformities, masses, or tenderness noted. Chest Wall:  no reproducible TTP of low back or neck Lungs:  Normal respiratory effort, chest expands symmetrically. Lungs are clear to auscultation, no crackles or wheezes. Heart:  tachycardic and regular rhythm, II/VI SEM.   Abdomen:  Bowel sounds positive,abdomen soft and non-tender without masses, organomegaly or hernias noted. Extremities:  no lower extremity edema Neurologic:  alert & oriented X3.  nonfocal. CN II-XII grossly intact Additional Exam:  Na 137, K 4.6, Cl 111, CO2 20, Gluc 101, BUN 27, Cr 1.97, Tbili 4.6, Alk phos 123, AST 53, ALT 36, Tprot 7.5, Alb 4.5, Ca 9.1 Repeat 6 hours later: Na 139, K 4.1, Cl 110, CO2 22, Gluc 101, BUN 23, Cr 1.69, Ca 8.4  Lipase 17  WBC 17.4, Hgb 7.3, Hct 20.3, MCV 124, Plt 216, ANC 10.7; repeat 6 hours later: WBC 14.7, Hgb 6.4,  Hct 18.3, Plt 182, ANC 10.2  Retic % 4  U/A small blood  Head CT:   1. Stable noncontrast CT appearance of the brain with left greater  than right peri-sylvian volume loss. Changes of sickle cell anemia including prominent osseous  marrow spaces and low attenuation of vascular structures.                  CXR: Stable chronic findings.  No superimposed acute process                  Impression & Recommendations:  Problem # 1:  SICKLE CELL CRISIS (ICD-282.62) Assessment Deteriorated no relief with dilaudid. typically responds well to toradol, but unable to administer 2/2  renal insufficiency. will place on dilaudid PCA. per records and patient, baseline hgb between 7 and 9. states he usually gets transfused <7. repeat hgb 6.4 (likely a dilutional component). however, ED has type and cross in process, so will transfuse 1 unit. check cbc, retic count in a.m. Slightly elevated WBCs, but afebrile, not hypoxic, no chest pain. will not tx with antibiotics for acute chest at this time. would contact [facility] on monday. will place on miralax daily for bowel regimen.  Problem # 2:  RENAL INSUFFICIENCY (ICD-588.9) Assessment: Unchanged initial Cr 1.9. repeat 1.6. Baseline appears to be between 1.4 and 1.6 upon eval of records. monitor. will hydrate with IVFs at 150 ml/hr. avoid nephrotoxins.   Problem # 3:  TOBACCO ABUSE (ICD-305.1) smoking cessation consult  Problem # 4:  FEN/GI regular diet. D5NS @ 150 ml/hr.   Problem # 5:  prophylaxis  heparin  Problem # 6:  disposition pending pain control on oral medications
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
## 2010-03-15 NOTE — Progress Notes (Signed)
Summary: Labs/instructions- LVM   Phone Note Outgoing Call   Call placed by: Vic Blackbird MD,  November 26, 2009 12:24 PM Details for Reason: Labs Summary of Call: Left a voice message, Hb 7.7 does not need transfusion, his infection fighting cells are up. Drink plenty of fluids, if his pain is not controlled or the he starts vomiting or gets a fever go to ER

## 2010-03-15 NOTE — Miscellaneous (Signed)
   Clinical Lists Changes  Problems: Changed problem from RENAL INSUFFICIENCY (ICD-588.9) to CHRONIC KIDNEY DISEASE STAGE II (MILD) (ICD-585.2)
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
## 2010-03-15 NOTE — Miscellaneous (Signed)
Summary: ROI  ROI   Imported By: Samara Snide 11/26/2009 13:00:55  _____________________________________________________________________  External Attachment:    Type:   Image     Comment:   External Document

## 2010-03-15 NOTE — Assessment & Plan Note (Signed)
Summary: hfu,df   Vital Signs:  Patient profile:   30 year old male Height:      63 inches Weight:      119 pounds BMI:     21.16 BSA:     1.55 Temp:     98.3 degrees F Pulse rate:   82 / minute BP sitting:   124 / 76  Vitals Entered By: Christen Bame CMA (January 04, 2010 11:21 AM) CC: hfu Is Patient Diabetic? No Pain Assessment Patient in pain? yes     Location: both arms Intensity: 8   Primary Care Provider:  Mylinda Latina MD  CC:  hfu.  History of Present Illness: Hospital Follow up:  Pt was discharged from the hospital about 1 week ago because of sickle cell pain crisis and pneumonia  1. Pain crisis:  His pain is better but he is still in a lot of pain.  He hasn't taken any pain medicine for the past 4 days because he was only given enough for a couple of days.  He is still having bad pain in both arms.  Pain is rated an 8/10.  Pain in his legs is improved.  2. Pneumonia:  Pt was discharged with a couple day Rx of Azithromycin.  He took the antibiotics as prescribed.  His breathing is better.  He does feel like he gets short of breath more easily than he used to.   ROS: denies fevers, chest pain, dyspnea  Habits & Providers  Alcohol-Tobacco-Diet     Alcohol drinks/day: 0     Tobacco Status: quit < 6 months     Tobacco Counseling: to remain off tobacco products     Cigarette Packs/Day: <0.25  Current Medications (verified): 1)  Enalapril Maleate 2.5 Mg  Tabs (Enalapril Maleate) .... 3 Tabs By Mouth Once Daily 2)  Fluticasone Propionate 50 Mcg/act  Susp (Fluticasone Propionate) .... 2 Sprays Into Each Nostril Daily For Relief of Rhinitis/sinusitis 3)  Hydroxyurea 500 Mg Caps (Hydroxyurea) .... 1500mg  By Mouth Once Daily 4)  Oxycontin 30 Mg Xr12h-Tab (Oxycodone Hcl) .... Take 1 Tab By Mouth Two Times A Day 5)  Promethazine Hcl 12.5 Mg Tabs (Promethazine Hcl) .Marland Kitchen.. 1 By Mouth Q 6hrs As Needed N/v  Allergies: 1)  * Mandol  Social History: Reviewed history  from 04/23/2008 and no changes required. Lives with wife and mother  in [place]. Does smoke, denies drugs.  Occasinal alcohol.   Not currently working.  Physical Exam  General:  Vitals reviewed.  Well-developed,well-nourished, mild disress  Head:  normocephalic and atraumatic.   Eyes:  pupils equal, pupils round, and pupils reactive to light.  EOMI.  Mild scleral icterus Neck:  supple, full ROM, and no masses.   Lungs:  normal respiratory effort, CTAB w/o wheeze or crackles Heart:  RRR, no murmurs Abdomen:  soft, non-tender, normal bowel sounds, and no distention.  No H/Smegaly Msk:  Arms: bilaterally TTP in forearms up to elbows.  Full ROM.  No redness.  No focal / point tenderness  Legs: nonTTP.  Full ROM.  No swelling or redness. Extremities:  no edema   Impression & Recommendations:  Problem # 1:  SICKLE CELL CRISIS (ICD-282.62) Assessment New  Still with uncontrolled pain. He has been out of pain medicines for the past 3-4 days.  Will give him a shot of Morphine in the clinic and refill his Oxycontin.  Will have him follow up in a couple of days to see how he is doing.  Orders: [place]- Est  Level 4 [id number])  Problem # 2:  BACTERIAL PNEUMONIA (ICD-482.9) Assessment: Improved  Overall his breathing has improved.  He is not complaining of any pain in his lungs or ribs.  He didn't want to get an x-ray to check on resolution.  He is complaining of getting short of breath easier than he normally would.  Will have him come back in a couple of days.  If those symptoms persist would send him for an x-ray.  Orders: [place]- Est  Level 4 [id number])  Complete Medication List: 1)  Enalapril Maleate 2.5 Mg Tabs (Enalapril maleate) .... 3 tabs by mouth once daily 2)  Fluticasone Propionate 50 Mcg/act Susp (Fluticasone propionate) .... 2 sprays into each nostril daily for relief of rhinitis/sinusitis 3)  Hydroxyurea 500 Mg Caps (Hydroxyurea) .... 1500mg  by mouth once daily 4)  Oxycontin 30 Mg  Xr12h-tab (Oxycodone hcl) .... Take 1 tab by mouth two times a day 5)  Promethazine Hcl 12.5 Mg Tabs (Promethazine hcl) .Marland Kitchen.. 1 by mouth q 6hrs as needed n/v  Other Orders: Morphine Sulfate inj 10 mg [id number])  Patient Instructions: 1)  Happy Early Birthday 2)  We will try and get you feeling better 3)  Take your Oxycontin as prescribed 4)  If your pain gets worse, or if you develop shortness of breath or fevers than you need to return to clinic or go to the ED 5)  Please schedule a follow up appointment with me towards the end of the week Prescriptions: OXYCONTIN 30 MG XR12H-TAB (OXYCODONE HCL) Take 1 tab by mouth two times a day  #60 x 0   Entered and Authorized by:   Mylinda Latina MD   Signed by:   Mylinda Latina MD on 01/04/2010   Method used:   Print then Give to Patient   RxID:   [id number]    Medication Administration  Injection # 1:    Medication: Morphine Sulfate inj 10 mg    Diagnosis: SICKLE CELL ANEMIA (ICD-282.60)    Route: IM    Site: RUOQ gluteus    Exp Date: 11/14/2010    Lot #: [id number]    Mfr: hospira    Comments: Pt sts that he has a ride    Patient tolerated injection without complications    Given by: Christen Bame CMA (January 04, 2010 12:00 PM)  Orders Added: 1)  Morphine Sulfate inj 10 mg [J2270] 2)  [place]- Est  Level 4 [id number]     Medication Administration  Injection # 1:    Medication: Morphine Sulfate inj 10 mg    Diagnosis: SICKLE CELL ANEMIA (ICD-282.60)    Route: IM    Site: RUOQ gluteus    Exp Date: 11/14/2010    Lot #: [id number]    Mfr: hospira    Comments: Pt sts that he has a ride    Patient tolerated injection without complications    Given by: Christen Bame CMA (January 04, 2010 12:00 PM)  Orders Added: 1)  Morphine Sulfate inj 10 mg [J2270] 2)  [place]- Est  Level 4 [id number]

## 2010-03-15 NOTE — Miscellaneous (Signed)
Summary: medical record request  Clinical Lists Changes  Rec'd medical record request to go to: Attorney at law Doristine Counter Date sent: 11/09/09 Tobin Chad  January 11, 2010 2:25 PM
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
## 2010-03-15 NOTE — Progress Notes (Signed)
Summary: triage   Phone Note Call from Patient Call back at [phone]   Caller: Patient Summary of Call: Lower back pain with some urinating problems and throwing up.   Initial call taken by: Raymond Gurney,  November 22, 2009 10:09 AM  Follow-up for Phone Call        sent to [facility] as we have no appts left. he agreed Follow-up by: Elige Radon RN,  November 22, 2009 10:51 AM

## 2010-03-15 NOTE — Consult Note (Signed)
Summary: PT DISCHARGE  PT DISCHARGE   Imported By: Audie Clear 10/26/2009 11:36:34  _____________________________________________________________________  External Attachment:    Type:   Image     Comment:   External Document

## 2010-03-15 NOTE — Progress Notes (Signed)
Summary: triage   Phone Note Call from Patient Call back at [phone]   Caller: Patient Summary of Call: Pt has diahrrea and back hurting wants to be worked in. Initial call taken by: Raymond Gurney,  November 24, 2009 10:32 AM  Follow-up for Phone Call        states UC gave him a shot for his nausea. he refuses to go back there. he is out of pain meds. told him to buy Immodium & follow instructions. told him thi susually stops the diarrhea. he is in a lot of pain. we have no appts left for today.made him one for 8:30am tomorrow message to pcp to see if he will refill the pain meds. he states he cannot take much more. said he will go to ED if it gets worse  said he does not have reliable transportation. he missed his siclke cell appt in [place]. next one is 11/4 Follow-up by: Elige Radon RN,  November 24, 2009 10:37 AM  Additional Follow-up for Phone Call Additional follow up Details #1::        Will refill pain meds this time.  He needs to have close follow up.  Schedule him for next available appt. Additional Follow-up by: Mylinda Latina MD,  November 24, 2009 10:52 AM    Additional Follow-up for Phone Call Additional follow up Details #2::    told him the meds are here to be picked up. states he is going to keep appt in am because of n&v. told him he needs to see his pcp. to make that appt when he comes in Follow-up by: Elige Radon RN,  November 24, 2009 12:28 PM  Prescriptions: ROXICODONE 5 MG TABS (OXYCODONE HCL) 1-2 by mouth q 4-6 as needed  #30 x 0   Entered and Authorized by:   Mylinda Latina MD   Signed by:   Mylinda Latina MD on 11/24/2009   Method used:   Print then Give to Patient   RxID:   [id number] OXYCONTIN 30 MG XR12H-TAB (OXYCODONE HCL) Take 1 tab by mouth two times a day  #30 x 0   Entered and Authorized by:   Mylinda Latina MD   Signed by:   Mylinda Latina MD on 11/24/2009   Method used:   Print then Give to Patient   RxID:    [id number]

## 2010-03-15 NOTE — Progress Notes (Signed)
Summary: Records   Phone Note Call from Patient Call back at Home Phone [phone]   Reason for Call: Talk to Nurse Summary of Call: pt  checking status of records that were suppose to be faxed to his lawyer? Says he signed ROI  Initial call taken by: Samara Snide,  November 24, 2009 12:55 PM  Follow-up for Phone Call        Pt sts he was given the form 2 months ago by the "lady in the first seat up front with long hair" Juliann Pulse H)  Sts he filled it out and gave it back to her and they was told they would send it.  I will check with Juliann Pulse. Follow-up by: Christen Bame CMA,  November 24, 2009 3:11 PM  Additional Follow-up for Phone Call Additional follow up Details #1::        Juliann Pulse does not remember and there is no evidence of [place] having the form.  Pt informed and has a [place] appt tomorrow will fill out then. Additional Follow-up by: Christen Bame CMA,  November 24, 2009 5:13 PM

## 2010-03-15 NOTE — Assessment & Plan Note (Signed)
Summary: back pain & diarrhea./[name] [name]/[name] [name](resch' from 10/13)[facility]   Vital Signs:  Patient profile:   30 year old male Weight:      128.3 pounds Temp:     98 degrees F oral Pulse rate:   92 / minute BP sitting:   112 / 66  (right arm)  Vitals Entered By: Mauricia Area CMA, (November 26, 2009 8:39 AM) CC: lower back pain. seen at [facility] Is Patient Diabetic? No Pain Assessment Patient in pain? yes     Location: lower back Intensity: 7 Onset of pain  x 5 days.   Primary Care Provider:  Mylinda Latina MD  CC:  lower back pain. seen at [facility]  History of Present Illness:   Pt seen at urgent care center on 10/10 with back pain,nausea and emesis diagnosis of gastroenteritis given script for Zofran unable to fill and 6 oxycontin tablets  Today nausea improved no emesis for past 24 hours, stomach feels quesy but able to eat. His back continues to ache feels similar to previous sickle cell crisis before they get bad.No acute injury  Out of pain medications. No problems with bowel or bladder no leg weakness. Also endorsed migraine which started today, takes Nortriptyline but does not feel it helps, see [place] Providers  Alcohol-Tobacco-Diet     Tobacco Status: quit < 6 months     Tobacco Counseling: to remain off tobacco products  Current Medications (verified): 1)  Enalapril Maleate 2.5 Mg  Tabs (Enalapril Maleate) .... 3 Tabs By Mouth Once Daily 2)  Fluticasone Propionate 50 Mcg/act  Susp (Fluticasone Propionate) .... 2 Sprays Into Each Nostril Daily For Relief of Rhinitis/sinusitis 3)  Hydroxyurea 500 Mg Caps (Hydroxyurea) .... 1500mg  By Mouth Once Daily 4)  Oxycontin 30 Mg Xr12h-Tab (Oxycodone Hcl) .... Take 1 Tab By Mouth Two Times A Day 5)  Promethazine Hcl 12.5 Mg Tabs (Promethazine Hcl) .Marland Kitchen.. 1 By Mouth Q 6hrs As Needed N/v  Allergies (verified): 1)  * Mandol  Social History: Smoking Status:  quit < 6 months  Physical Exam  General:   Well-developed,well-nourished,in no acute distress; alert,appropriate and cooperative throughout examination Vital signs noted  Eyes:  scleral icterus.   Mouth:  moist mucus membranes.   Lungs:  normal respiratory effort, CTAB w/o wheeze or crackles Heart:  RRR, no murmurs Abdomen:  soft, non-tender, normal bowel sounds, and no distention.   Msk:  mild tender to palpation at lower lumbar, no joint swelling or warmth noted. full ROM at bilateral knees. neg SLR Motor 5/5 bilateral lower ext  Able to squat and bend at hips with normal flexion   Impression & Recommendations:  Problem # 1:  SICKLE CELL CRISIS (ICD-282.62) Assessment New Likley cause of back pain, no acute injury, given Morphine 4mg  and refill on pain meds Check Hb, fingerstick low, CBC showed Hb 7.7, no need for transfusion no cardiorespiratory concerns at this time oxycodone as needed discontinued as pt states it does not help at all and he prefers not to take, no NSAIDS secondary to CKD Orders: [facility] [id number]) Morphine Sulfate inj 10 mg (J2270) Promethazine up to 50mg  (J2550) [place]- Est  Level 4 (99214)  Problem # 2:  GASTROENTERITIS WITHOUT DEHYDRATION (ICD-558.9) Assessment: New  See instructions , push fluids, script for anti-emtic, unable to afford ZOfran  Orders: [place]- Est  Level 4 [id number])  Problem # 3:  MIGRAINE, COMMON (ICD-346.10) Assessment: Deteriorated  phenergan for abortive meds today, pt to continue nortrptiline per headache  center, will follow up with [facility] center in 2 weeks The following medications were removed from the medication list:    Roxicodone 5 Mg Tabs (Oxycodone hcl) .Marland Kitchen... 1-2 by mouth q 4-6 as needed His updated medication list for this problem includes:    Oxycontin 30 Mg Xr12h-tab (Oxycodone hcl) .Marland Kitchen... Take 1 tab by mouth two times a day  Orders: [place]- Est  Level 4 [id number])  Complete Medication List: 1)  Enalapril Maleate 2.5 Mg Tabs (Enalapril maleate) .... 3 tabs by mouth once  daily 2)  Fluticasone Propionate 50 Mcg/act Susp (Fluticasone propionate) .... 2 sprays into each nostril daily for relief of rhinitis/sinusitis 3)  Hydroxyurea 500 Mg Caps (Hydroxyurea) .... 1500mg  by mouth once daily 4)  Oxycontin 30 Mg Xr12h-tab (Oxycodone hcl) .... Take 1 tab by mouth two times a day 5)  Promethazine Hcl 12.5 Mg Tabs (Promethazine hcl) .Marland Kitchen.. 1 by mouth q 6hrs as needed n/v  Other Orders: [place] [id number])  Patient Instructions: 1)  I have given you a shot of phenergan and morphine to help your headache and pain 2)  Make sure you get plenty of fluids 3)  It is important to keep well hydrated even if you are not eating a lot. To keep hydrated, take small amounts of water, gaterade, or gingerale feqently. Start with bland foods such as toast or soup/broths and work your way to your regular diet. 4)  Return or go to ER if you develop fever, SOB, or have increased pain  Prescriptions: OXYCONTIN 30 MG XR12H-TAB (OXYCODONE HCL) Take 1 tab by mouth two times a day  #60 x 0   Entered and Authorized by:   Vic Blackbird MD   Signed by:   Vic Blackbird MD on 11/26/2009   Method used:   Handwritten   [id number] PROMETHAZINE HCL 12.5 MG TABS (PROMETHAZINE HCL) 1 by mouth q 6hrs as needed N/V  #40 x 0   Entered and Authorized by:   Vic Blackbird MD   Signed by:   Vic Blackbird MD on 11/26/2009   Method used:   Electronically to        [facility] [address] [phone]* (retail)       [address]       [place], [place]  [address]       Ph: [phone] or [phone]       Fax: [phone]   RxID:   [id number]    Medication Administration  Injection # 1:    Medication: Morphine Sulfate inj 10 mg    Diagnosis: SICKLE CELL CRISIS (ICD-282.62)    Route: IM    Site: RUOQ gluteus    Exp Date: 11/23/2010    Lot #: [id number]    Mfr: [email or web address]    Comments: 4 mg given per Dr.[place] pt's mom picked up pt.    Patient tolerated  injection without complications    Given by: Mauricia Area CMA, (November 26, 2009 9:26 AM)  Injection # 2:    Medication: Promethazine up to 50mg     Diagnosis: SICKLE CELL CRISIS (ICD-282.62)    Route: IM    Site: RUOQ gluteus    Exp Date: 02/14/2011    Lot #: [id number]    Mfr: [email or web address]    Comments: 12.5 mg given per Dr.[place] pt's mom picked up pt     Patient tolerated injection without complications    Given by: Mauricia Area CMA, (November 26, 2009  9:27 AM)  Orders Added: 1)  Hemoglobin-[facility] [85018] 2)  CBC-[facility] [id number] 3)  Morphine Sulfate inj 10 mg [J2270] 4)  Promethazine up to 50mg  [J2550] 5)  [facility]- Est  Level 4 [99214]   Laboratory Results   Blood Tests   Date/Time Received: November 26, 2009 9:17 AM  Date/Time Reported: November 26, 2009 10:07 AM     CBC   HGB:  6.8 g/dL   (Normal Range: 13.0-17.0 in Males, 12.0-15.0 in Females) Comments: Critical Hgb reported to Dr. Ajit Lasso drawn ...........test performed by...........Marland KitchenHedy Camara, CMA

## 2010-03-15 NOTE — Progress Notes (Signed)
   Phone Note Call from Patient   Caller: Patient Call For: [phone] Summary of Call: Lower back pain since last night.   Initial call taken by: Pamala Hurry mcgregor  Follow-up for Phone Call        states he thinks he is having a sickle cell crisis. told him to go to ED. (he has no pain meds) he does not want to go there again. states he recently got out of hosp. told him we have no appts & if in a crisis, will need to go to ED. he agreed Follow-up by: Elige Radon RN,  October 22, 2009 9:38 AM

## 2010-03-16 LAB — CROSSMATCH
Antibody Screen: NEGATIVE
Unit division: 0

## 2010-03-17 NOTE — Assessment & Plan Note (Signed)
Summary: N/V and back pain/kf   Vital Signs:  Patient profile:   30 year old male Weight:      126 pounds Temp:     98.9 degrees F Pulse rate:   84 / minute BP sitting:   118 / 73  (left arm) Cuff size:   regular  Vitals Entered By: Elray Mcgregor RN (January 31, 2010 2:19 PM) CC: [place] for back pain Is Patient Diabetic? No Pain Assessment Patient in pain? yes     Location: back Intensity: 8 Onset of pain  Since yesterday   Primary Care Eino Whitner:  Mylinda Latina MD  CC:  [place] for back pain.  History of Present Illness: 1 day of back pain (usual sickle cell pain) and N/V.  pt is drinking gatorade to stay hydrated. out of oxycontin and phenergan but thinks he will be able to stay out of the hospital.  He wants to avoid hospitalization during the holidays.    Habits & Providers  Alcohol-Tobacco-Diet     Alcohol drinks/day: 0     Tobacco Status: quit < 6 months     Tobacco Counseling: to remain off tobacco products     Cigarette Packs/Day: <0.25  Current Medications (verified): 1)  Enalapril Maleate 2.5 Mg  Tabs (Enalapril Maleate) .... 3 Tabs By Mouth Once Daily 2)  Fluticasone Propionate 50 Mcg/act  Susp (Fluticasone Propionate) .... 2 Sprays Into Each Nostril Daily For Relief of Rhinitis/sinusitis 3)  Hydroxyurea 500 Mg Caps (Hydroxyurea) .... 1500mg  By Mouth Once Daily 4)  Oxycontin 30 Mg Xr12h-Tab (Oxycodone Hcl) .... Take 1 Tab By Mouth Two Times A Day 5)  Promethazine Hcl 12.5 Mg Tabs (Promethazine Hcl) .Marland Kitchen.. 1 By Mouth Q 6hrs As Needed N/v  Allergies (verified): 1)  * Mandol  Past History:  Social History: Last updated: 04/23/2008 Lives with wife and mother  in [place]. Does smoke, denies drugs.  Occasinal alcohol.   Not currently working.  Review of Systems  The patient denies anorexia, fever, weight loss, chest pain, and syncope.    Physical Exam  General:  VS reviewed. Mouth:  mmm Lungs:  Normal respiratory effort, chest expands symmetrically. Lungs  are clear to auscultation, no crackles or wheezes. Heart:  Normal rate and regular rhythm. S1 and S2 normal without gallop, murmur, click, rub or other extra sounds. Abdomen:  Bowel sounds positive,abdomen soft without masses, organomegaly or hernias noted.   mild tenderness throughout Msk:  tenderness in lumbar spine no tenderness in CVA   Impression & Recommendations:  Problem # 1:  SICKLE CELL CRISIS (ICD-282.62) Assessment Deteriorated gave oxycontin and phenergan for home.  advised careful hydration.  check labs and cbc.  if less than 6.5, have pt return for transfusion.  watch for aplastic crisis. Orders: Comp Met-[facility] [phone]) CBC-[facility] [id number]) Retic-[facility] [id number]) Morphine Sulfate inj 10 mg (J2270) [facility]- Est  Level 4 (99214)  Complete Medication List: 1)  Enalapril Maleate 2.5 Mg Tabs (Enalapril maleate) .... 3 tabs by mouth once daily 2)  Fluticasone Propionate 50 Mcg/act Susp (Fluticasone propionate) .... 2 sprays into each nostril daily for relief of rhinitis/sinusitis 3)  Hydroxyurea 500 Mg Caps (Hydroxyurea) .... 1500mg  by mouth once daily 4)  Oxycontin 30 Mg Xr12h-tab (Oxycodone hcl) .... Take 1 tab by mouth two times a day 5)  Promethazine Hcl 12.5 Mg Tabs (Promethazine hcl) .Marland Kitchen.. 1 by mouth q 6hrs as needed n/v  Other Orders: Promethazine up to 50mg  [id number])  Patient Instructions: 1)  Take the oxycontin and the  phenergan for the pain and nausea. 2)  Try to stay hydrated.   3)  I will call you if your hemoglobin is low, you may need a transfusion. Prescriptions: PROMETHAZINE HCL 12.5 MG TABS (PROMETHAZINE HCL) 1 by mouth q 6hrs as needed N/V  #40 x 0   Entered and Authorized by:   Marlana Salvage MD   Signed by:   Marlana Salvage MD on 01/31/2010   Method used:   Print then Give to Patient   RxID:   [id number] OXYCONTIN 30 MG XR12H-TAB (OXYCODONE HCL) Take 1 tab by mouth two times a day  #60 x 0   Entered and Authorized by:   Marlana Salvage MD   Signed  by:   Marlana Salvage MD on 01/31/2010   Method used:   Print then Give to Patient   RxID:   [id number]    Medication Administration  Injection # 1:    Medication: Morphine Sulfate inj 10 mg    Diagnosis: SICKLE CELL CRISIS (ICD-282.62)    Route: IM    Site: RUOQ gluteus    Exp Date: 03/17/2011    Lot #: [id number]    Mfr: Hospira    Comments: Administered 4 mg    Given by: Elray Mcgregor RN (January 31, 2010 3:10 PM)  Injection # 2:    Medication: Promethazine up to 50mg     Diagnosis: MIGRAINE, COMMON (ICD-346.10)    Route: IM    Site: LUOQ gluteus    Exp Date: 09/14/2011    Lot #: [id number]    Mfr: Novaplus    Comments: Admnistered 12.5 mg    Given by: Elray Mcgregor RN (January 31, 2010 3:10 PM)  Orders Added: 1)  Comp Met-[facility] [id number] 2)  CBC-[facility] [85027] 3)  Retic-[facility] [id number] 4)  Promethazine up to 50mg  [J2550] 5)  Morphine Sulfate inj 10 mg [J2270] 6)  [place]- Est  Level 4 [id number]     Medication Administration  Injection # 1:    Medication: Morphine Sulfate inj 10 mg    Diagnosis: SICKLE CELL CRISIS (ICD-282.62)    Route: IM    Site: RUOQ gluteus    Exp Date: 03/17/2011    Lot #: [id number]    Mfr: Hospira    Comments: Administered 4 mg    Given by: Elray Mcgregor RN (January 31, 2010 3:10 PM)  Injection # 2:    Medication: Promethazine up to 50mg     Diagnosis: MIGRAINE, COMMON (ICD-346.10)    Route: IM    Site: LUOQ gluteus    Exp Date: 09/14/2011    Lot #: [id number]    Mfr: Novaplus    Comments: Admnistered 12.5 mg    Given by: Elray Mcgregor RN (January 31, 2010 3:10 PM)  Orders Added: 1)  Comp Met[facility] [id number] 2)  CBC-[facility] [id number] 3)  Retic-[facility] [id number] 4)  Promethazine up to 50mg  [J2550] 5)  Morphine Sulfate inj 10 mg [J2270] 6)  [place]- Est  Level 4 [id number]

## 2010-03-17 NOTE — Assessment & Plan Note (Addendum)
Summary: pain legs,arms, back, Hx sickle cell anemia/ls   Vital Signs:  Patient profile:   30 year old male Height:      63 inches Weight:      129.5 pounds BMI:     23.02 Temp:     98.9 degrees F oral Pulse rate:   88 / minute BP sitting:   109 / 72  (left arm) Cuff size:   regular  Vitals Entered By: Enid Skeens, CMA (March 10, 2010 3:05 PM) CC: pain over body x2 day Is Patient Diabetic? No   Primary Care Provider:  Mylinda Latina MD  CC:  pain over body x2 day.  History of Present Illness: 30 yo male with SS.  2 days of pain all over, feels like this is similar to usual sickle crises.  He was admitted recently for this.  He has no SOB or CP, no fevers/chills, no nausea/diarrhea, no dysuria, voiding regularly.  He has some scleral icterus but says this is unchanged from he usual.  No rashes/sores.  He is out of his narcotics and says he feels a little dehydrated.  Current Medications (verified): 1)  Enalapril Maleate 2.5 Mg  Tabs (Enalapril Maleate) .... 3 Tabs By Mouth Once Daily 2)  Fluticasone Propionate 50 Mcg/act  Susp (Fluticasone Propionate) .... 2 Sprays Into Each Nostril Daily For Relief of Rhinitis/sinusitis 3)  Hydroxyurea 500 Mg Caps (Hydroxyurea) .... 1500mg  By Mouth Once Daily 4)  Oxycontin 30 Mg Xr12h-Tab (Oxycodone Hcl) .... Take 1 Tab By Mouth Two Times A Day 5)  Promethazine Hcl 12.5 Mg Tabs (Promethazine Hcl) .Marland Kitchen.. 1 By Mouth Q 6hrs As Needed N/v  Allergies (verified): 1)  * Mandol  Review of Systems       See HPI  Physical Exam  General:  Well-developed,well-nourished,in no acute distress; alert,appropriate and cooperative throughout examination Head:  Normocephalic and atraumatic without obvious abnormalities. No apparent alopecia or balding. Eyes:  No corneal or conjunctival inflammation noted. EOMI. Perrla. Sclerae icteric Ears:  External ear exam shows no significant lesions or deformities.  Otoscopic examination reveals clear canals, tympanic  membranes are intact bilaterally without bulging, retraction, inflammation or discharge. Hearing is grossly normal bilaterally. Nose:  External nasal examination shows no deformity or inflammation. Nasal mucosa are pink and moist without lesions or exudates. Mouth:  Oral mucosa and oropharynx without lesions or exudates.  Teeth in good repair. Neck:  No deformities, masses, or tenderness noted. Lungs:  Normal respiratory effort, chest expands symmetrically. Lungs are clear to auscultation, no crackles or wheezes. Heart:  Normal rate and regular rhythm. S1 and S2 normal without gallop, murmur, click, rub or other extra sounds. Abdomen:  Bowel sounds positive,abdomen soft and non-tender without masses, organomegaly or hernias noted. Extremities:  Warm, well perfused, no edema.   Impression & Recommendations:  Problem # 1:  SICKLE CELL CRISIS (ICD-282.62) Assessment Deteriorated Toradol 30, Morphine 4mg , 1L NS given in office. Pain resolved. Checking CBC, CMET, retic. No chest.resp symptoms to suggest acute chest syndrome. Dark urine may indicate dehydration. Refilled oxycontin. RTC as needed, signs/symptoms acute chest advised.  Orders: Ketorolac-Toradol 15mg  [id number])  Complete Medication List: 1)  Enalapril Maleate 2.5 Mg Tabs (Enalapril maleate) .... 3 tabs by mouth once daily 2)  Fluticasone Propionate 50 Mcg/act Susp (Fluticasone propionate) .... 2 sprays into each nostril daily for relief of rhinitis/sinusitis 3)  Hydroxyurea 500 Mg Caps (Hydroxyurea) .... 1500mg  by mouth once daily 4)  Oxycontin 30 Mg Xr12h-tab (Oxycodone hcl) .... Take 1 tab  by mouth two times a day 5)  Promethazine Hcl 12.5 Mg Tabs (Promethazine hcl) .Marland Kitchen.. 1 by mouth q 6hrs as needed n/v  Patient Instructions: 1)  Great to meet you. 2)  Morphine and toradol given in office. 3)  1 Liter normal saline given. 4)  Checking bloodwork. 5)  Come back if you develop chest pain or trouble breathing. 6)  Refilled your  oxycontin. 7)  -Dr. Darene Lamer.   Medication Administration  Injection # 1:    Medication: Morphine Sulfate inj 10 mg    Diagnosis: SICKLE CELL CRISIS (ICD-282.62)    Route: IM    Site: LUOQ gluteus    Mfr: Abbott    Comments: pt given 4mg     Patient tolerated injection without complications    Given by: Audelia Hives CMA (March 10, 2010 5:31 PM)  Injection # 2:    Medication: Ketorolac-Toradol 15mg     Diagnosis: SICKLE CELL CRISIS (ICD-282.62)    Route: IM    Site: LUOQ gluteus    Exp Date: 10/15/2011    Lot #: [id number]    Mfr: hospira    Patient tolerated injection without complications    Given by: Audelia Hives CMA (March 10, 2010 5:30 PM)  Orders Added: 1)  Ketorolac-Toradol 15mg  [id number]    Laboratory Results   Urine Tests  Date/Time Received: March 10, 2010 3:28 PM  Date/Time Reported: March 10, 2010 4:00 PM   Routine Urinalysis   Color: dark yellow Appearance: Clear Glucose: negative   (Normal Range: Negative) Bilirubin: negative   (Normal Range: Negative) Ketone: negative   (Normal Range: Negative) Spec. Gravity: 1.015   (Normal Range: 1.003-1.035) Blood: small   (Normal Range: Negative) pH: 6.0   (Normal Range: 5.0-8.0) Protein: 30   (Normal Range: Negative) Urobilinogen: 0.2   (Normal Range: 0-1) Nitrite: negative   (Normal Range: Negative) Leukocyte Esterace: negative   (Normal Range: Negative)  Urine Microscopic Bacteria/HPF: 2+ cocci Mucous/HPF: 1+ Epithelial/HPF: rare Casts/LPF: 5-10 hyaline,  occ granular    Comments: ...............test performed by......Marland KitchenBonnie A. Martinique, MLS (ASCP)cm      Appended Document: pain legs,arms, back, Hx sickle cell anemia/ls   Medication Administration  Infusion # 1:    Diagnosis: SICKLE CELL CRISIS (ICD-282.62)    Started: 4:25 PM    Stopped: 5:35 PM    Solution: D5W 0.9% NS 1064ml    Instructions: Infuse over 1 hour    Route: IV infusion    Site: L hand    Ordered by: Aundria Mems  MD    Administered by: Margaretha Glassing RN    Comments: I V started in left and at thumb area by Dr Dianah Field using a # 22 gauge IV catheter.  IV was removed By [place]    Patient tolerated infusion without complications  Orders Added: 1)  D5W 0.9% NS 1040ml [[id number]]   Appended Document: Orders Update     Clinical Lists Changes  Orders: Added new Test order of [facility]- Est Level  3 [id number]) - Signed

## 2010-03-17 NOTE — Progress Notes (Signed)
Summary: triage call   Phone Note Call from Patient   Caller: Patient Call For: [phone] Summary of Call: Pt think he has a stomach virus.  Haven't been able to stop regurgitating.  Pt have hx of sickle cell Initial call taken by: Eusebio Friendly,  January 31, 2010 12:00 PM  Follow-up for Phone Call        Nausea and vomiting x 24 hours.  Vomiting about every hour.  Also states that he's having back pain as well.  Was going to send him to [facility] but he states he's already on his way and in our parking lot.  Placed him in the [facility] overfllow schedule. Follow-up by: Elray Mcgregor RN,  January 31, 2010 1:49 PM

## 2010-03-21 NOTE — Discharge Summary (Signed)
NAME:  [name] [name], [name] [name] NO.:  [id number]  MEDICAL RECORD NO.:  [id number]          PATIENT TYPE:  INP  LOCATION:  [id number]                         FACILITY:  [facility]  PHYSICIAN:  Dalbert Mayotte, MD        DATE OF BIRTH:  22-Feb-1980  DATE OF ADMISSION:  03/14/2010 DATE OF DISCHARGE:  03/15/2010                              DISCHARGE SUMMARY   PRIMARY CARE PROVIDER:  Mylinda Latina, MD at [facility].  DISCHARGE DIAGNOSES: 1. Sickle cell pain crisis. 2. Sickle cell disease. 3. Chronic narcotic use. 4. Chronic kidney disease.  Discharge medications include: 1. Oxycodone CR 30 mg p.o. b.i.d. 2. Oxycodone 5 mg IR 5-10 mg p.o. q.4-6 h. p.r.n. pain. 3. Hydroxyurea 1000 mg p.o. daily. 4. Nortriptyline 25 mg p.o. nightly. 5. Enalapril 7.5 mg p.o. daily.  CONSULTS:  None.  PROCEDURES:  Chest x-ray on March 15, 2010, showing mild scarring in the left lung base, no evidence of pleural effusion, no active disease.  LABORATORY DATA:  Hemoglobin in the clinic was found to be 6.7.  CBC on the day of discharge showed WBC 11.2, hemoglobin 11.1, hematocrit 29.9, platelet 168, reticulocyte count 20.8.  BMP within normal limits with the exception of creatinine of 1.74 as well as elevated total bili at 5.6 and AST at 52.  BRIEF HOSPITAL COURSE:  This is a 30 year old male with known sickle cell disease, presenting in an acute pain crisis from clinic. 1. Sickle cell disease and pain crisis.  When seen in clinic last     week, the patient's hemoglobin on March 10, 2010, was found to be     9.8.  On the day of admission, hemoglobin was found to be 6.7 by     fingerstick in clinic.  The patient's baseline hemoglobin is known     to be at 8-9.  The patient was also experiencing significant pain     in his typical pain crisis areas.  The patient was initially     started on IV Dilaudid and was quickly switched to by mouth pain     medications of the day of discharge  including OxyContin CR 30 mg     p.o. b.i.d. and oxycodone IR 5-10 mg p.o. q.4-6 h. p.r.n.     breakthrough pain.  The patient appeared to be tolerating these     medications well and states that his pain is much better controlled     when he came in.  In addition, when the patient initially came in     with a hemoglobin of 6.7, he was given 1 unit PRBCs, hemoglobin     improved to 11.1.  The patient was unlikely to have an acute chest     component to this pain crisis as he had a normal chest x-ray, he     was afebrile on room air with no increased work of breathing or     oxygen requirement.  The patient was continued on his hydroxyurea     and was hydrated with IV fluids at 120 mL/hour. 2. Chronic kidney disease.  The patient's baseline creatinine appears  to be approximately 1.4, on the day of discharge, his creatinine     was 1.74.  However, the patient was thought to be dehydrated on     admission.  IV fluids were continued and the patient was encouraged     to take good p.o. hydration while at home.  FOLLOWUP APPOINTMENTS:  The patient is to follow up with Dr. Loraine Maple at [facility] on Thursday, March 24, 2010, at 3:30 p.m. Issues for Dr. Loraine Maple to follow up on include pain control as well as hydration status.  CBC and BMET should likely be obtained at that time. In addition, the patient was instructed to call clinic or come to the emergency department if pain worsens or if he starts having chest pain, shortness of breath.  DISCHARGE CONDITION:  The patient was discharged home with his mother in stable medical condition.    ______________________________ Lorin Glass, MD   ______________________________ Dalbert Mayotte, MD    JM/MEDQ  D:  03/15/2010  T:  03/16/2010  Job:  [id number]  cc:   Mylinda Latina, MD Lorin Glass, MD  Electronically Signed by Lorin Glass MD on 03/16/2010 08:49:35 AM Electronically Signed by Dalbert Mayotte MD on  03/21/2010 11:52:50 AM

## 2010-03-23 NOTE — Initial Assessments (Signed)
Summary: sickle cell pain crisis   Vital Signs:  Patient profile:   30 year old male Height:      63 inches Weight:      124.50 pounds BMI:     22.13 BSA:     1.58 Temp:     98.8 degrees F Pulse rate:   92 / minute BP sitting:   130 / 70  Vitals Entered By: Christen Bame CMA (March 14, 2010 1:55 PM) CC: not any better Is Patient Diabetic? No Pain Assessment Patient in pain? yes     Location: all over Intensity: 10   Primary Care Provider:  Mylinda Latina MD  CC:  not any better.  History of Present Illness: 1. Sickle Cell pain crisis:  Pt presents to clinic with his typical sickle cell pain crisis.  He was seen in clinic last week and was given some IVF as well as an injection of Morphine and Toradol.  He did better after leaving the clinic for about 1 day and then his pain started to get worse again.  His pain is rated a 10/10.  It is located in his arms, hands, and legs.  He has been drinking a good amount of liquids and has normal amount of voids.  He has been taking the OxyContin as prescribed.  ROS: endorses some shortness of breath, denies chest pain, pleuritic chest pain, abdominal pain, fevers.  Endorses headache.  Habits & Providers  Alcohol-Tobacco-Diet     Alcohol drinks/day: 0     Tobacco Status: quit < 6 months     Tobacco Counseling: to remain off tobacco products     Cigarette Packs/Day: <0.25  Current Medications (verified): 1)  Enalapril Maleate 2.5 Mg  Tabs (Enalapril Maleate) .... 3 Tabs By Mouth Once Daily 2)  Fluticasone Propionate 50 Mcg/act  Susp (Fluticasone Propionate) .... 2 Sprays Into Each Nostril Daily For Relief of Rhinitis/sinusitis 3)  Hydroxyurea 500 Mg Caps (Hydroxyurea) .... 1500mg  By Mouth Once Daily 4)  Oxycontin 30 Mg Xr12h-Tab (Oxycodone Hcl) .... Take 1 Tab By Mouth Two Times A Day 5)  Promethazine Hcl 12.5 Mg Tabs (Promethazine Hcl) .Marland Kitchen.. 1 By Mouth Q 6hrs As Needed N/v  Allergies: 1)  * Mandol  Past History:  Past  Medical History: Reviewed history from 03/06/2009 and no changes required. h/o Priapism SS sickle cell - baseline hemoglbin 9.0 per mom, followed by [facility] Dr. Waldo Laine Constipation Proteinuria Renal Insufficiency Hx Acute chest last admitted for this in Oct 2007 - received 2 units PRBCs Has a history of mild cerebral edema at age 66-12 yo.   Also h/o small infarcts followed by CTs at [facility], last one around age 10.  Social History: Reviewed history from 04/23/2008 and no changes required. Lives with wife and mother  in [place]. Does smoke, denies drugs.  Occasinal alcohol.   Not currently working.  Physical Exam  General:  Vitals reviewed.  Tachycardic.  Uncomfortable appearing.  In moderate distress Eyes:  Sclerae icteric.  PERRL, EOMI Mouth:  Oral mucosa and oropharynx without lesions or exudates.  Teeth in good repair.  Tachy MM Neck:  No JVD or LAD Lungs:  Normal respiratory effort, chest expands symmetrically. Lungs are clear to auscultation, no crackles or wheezes. Heart:  Normal rate and regular rhythm. S1 and S2 normal without gallop, murmur, click, rub or other extra sounds. Abdomen:  Bowel sounds positive,abdomen soft and non-tender without masses, organomegaly or hernias noted. Msk:  no joint swelling, no  joint warmth, no redness over joints, and no joint deformities.  no joint swelling, no joint warmth, no redness over joints, and no joint deformities.  no joint swelling, no joint warmth, no redness over joints, and no joint deformities.   Extremities:  No LE edema Neurologic:  alert & oriented X3, cranial nerves II-XII intact, and sensation intact to light touch.  alert & oriented X3, cranial nerves II-XII intact, and sensation intact to light touch.   Skin:  no rash, several tatoos noted Psych:  not anxious appearing and not depressed appearing.  not anxious appearing and not depressed appearing.     Impression & Recommendations:  Problem # 1:   SICKLE CELL CRISIS (ICD-282.62) Assessment Deteriorated  Not controlled as an outpatient, not improved with IVF and Morhpine / Toradol in clinic.  Hemoglobin decreased.  Will admit for inpatient pain management.  Control pain with Morphine.  Will check CXR to make sure there isn't a component of acute chest.  Hemoglobin is decreased compared to last visit.  He doesn't have any source of definite sequestration.  Will recheck CBC in the hospital.  Discussed with Dr. Darene Lamer with inpatient team and he would like 1 U PRBCs so will write for transfusion when he gets to the hospital.  Orders: Morphine Sulfate inj 10 mg (J2270) Ketorolac-Toradol 15mg  [id number])  Problem # 2:  CHRONIC KIDNEY DISEASE STAGE II (MILD) (ICD-585.2) Check BMET FEN/GI:  NS@ 150cc/hr.   Regular diet.  Check BMET  Ppx: Heparin  Dispo:  Pending clinical improvement  Complete Medication List: 1)  Enalapril Maleate 2.5 Mg Tabs (Enalapril maleate) .... 3 tabs by mouth once daily 2)  Fluticasone Propionate 50 Mcg/act Susp (Fluticasone propionate) .... 2 sprays into each nostril daily for relief of rhinitis/sinusitis 3)  Hydroxyurea 500 Mg Caps (Hydroxyurea) .... 1500mg  by mouth once daily 4)  Oxycontin 30 Mg Xr12h-tab (Oxycodone hcl) .... Take 1 tab by mouth two times a day 5)  Promethazine Hcl 12.5 Mg Tabs (Promethazine hcl) .Marland Kitchen.. 1 by mouth q 6hrs as needed n/v  Other Orders: Hemoglobin-[facility] [id number])   Medication Administration  Injection # 1:    Medication: Morphine Sulfate inj 10 mg    Diagnosis: SICKLE CELL CRISIS (ICD-282.62)    Route: IM    Site: RUOQ gluteus    Exp Date: 03/17/2011    Lot #: [id number]    Mfr: [email or web address]    Comments: 4mg  given    Patient tolerated injection without complications    Given by: Christen Bame CMA (March 14, 2010 2:53 PM)  Injection # 2:    Medication: Ketorolac-Toradol 15mg     Diagnosis: SICKLE CELL CRISIS (ICD-282.62)    Route: IM    Site: RUOQ gluteus    Exp Date: 06/14/2011    Lot  #: [id number]    Mfr: [email or web address]    Comments: 30mg  given    Patient tolerated injection without complications    Given by: Christen Bame CMA (March 14, 2010 2:55 PM)  Orders Added: 1)  Hemoglobin-[facility] [85018] 2)  Morphine Sulfate inj 10 mg [J2270] 3)  Ketorolac-Toradol 15mg  [J1885]     Medication Administration  Injection # 1:    Medication: Morphine Sulfate inj 10 mg    Diagnosis: SICKLE CELL CRISIS (ICD-282.62)    Route: IM    Site: RUOQ gluteus    Exp Date: 03/17/2011    Lot #: [id number]    Mfr: hospira    Comments: 4mg  given    Patient tolerated injection without complications  Given by: Christen Bame CMA (March 14, 2010 2:53 PM)  Injection # 2:    Medication: Ketorolac-Toradol 15mg     Diagnosis: SICKLE CELL CRISIS (ICD-282.62)    Route: IM    Site: RUOQ gluteus    Exp Date: 06/14/2011    Lot #: [id number]    Mfr: [email or web address]    Comments: 30mg  given    Patient tolerated injection without complications    Given by: Christen Bame CMA (March 14, 2010 2:55 PM)  Orders Added: 1)  Hemoglobin-[facility] [85018] 2)  Morphine Sulfate inj 10 mg [J2270] 3)  Ketorolac-Toradol 15mg  [J1885]   Appended Document: sickle cell pain crisis     Clinical Lists Changes  Orders: Added new Test order of [facility]- Est Level  5 [id number]) - Signed      Appended Document: sickle cell pain crisis Attending admission addendum note:  30 y/o male with h/o sickle cell disease, CKD and chest syndrome development on last recent admission. Here c/o persistent pain crisis with similar characteristic from prior episodes low back and bilateral upper and low extremity pain. Has been managed out patient without significant improvement and his point of care hemoglobin today showing a decrease to 6.7 compared from 9.8 on last CBC (03/10/10).  Pertinent positive finding on exam [id number] scleral jaundice and obvious patient discomfort due to pain. Pertinent negatives: Normal lung and abdominal exam  and no extremity edema with normal neurovascular exam in upper and lower extremities.  Assessment: -Sickle cell pain crisis intractable as outpatient. -Sickle cell disease with possible worsening anemia. (as per point of care hg) -CKD -h/o chest syndrome.  Plan: Patient was examined and discussed by me with Dr. Tye Savoy and I agree with his documentation for physical exam and plan to admit patient for IV pain management and monitoring, gentle fluid hydration with repeat CBC, type and cross and chest x-ray to asses for transfusion needs and the possibility of chest syndrome.    Randa Spike, MD    Signed by Randa Spike  MD on 03/14/2010 at 4:31 PM  Appended Document: sickle cell pain crisis   Medication Administration  Infusion # 1:    Diagnosis: SICKLE CELL CRISIS (ICD-282.62)    Started: 2:35    Stopped: 3:45    Solution: 0.9% NS 1080ml    Instructions: Infuse over 1 hour    Route: IV infusion    Site: R hand    Ordered by: Dr. Tye Savoy    Administered by: Margaretha Glassing RN    Patient tolerated infusion without complications  Infusion # 2:    Diagnosis: SICKLE CELL CRISIS (ICD-282.62)    Started: 4:05    Solution: D5W 0.45% NS 531ml    Instructions: keep vein open    Route: IV infusion    Site: R hand    Ordered by: Dr. Tye Savoy    Administered by: Margaretha Glassing RN    Comments: [facility] transported to hospital  Orders Added: 1)  0.9% NS 1068ml [EMRZERO] 2)  D5W 0.45% NS 578ml [EMRZERO]

## 2010-03-24 ENCOUNTER — Inpatient Hospital Stay: Payer: Self-pay | Admitting: Family Medicine

## 2010-03-26 ENCOUNTER — Emergency Department (HOSPITAL_COMMUNITY): Payer: Medicare Other

## 2010-03-26 ENCOUNTER — Emergency Department (HOSPITAL_COMMUNITY)
Admission: EM | Admit: 2010-03-26 | Discharge: 2010-03-27 | Disposition: A | Discharge status: Home / Self Care | Payer: Medicare Other | Source: Home / Self Care | Attending: Emergency Medicine | Admitting: Emergency Medicine

## 2010-03-26 DIAGNOSIS — K116 Mucocele of salivary gland: Secondary | ICD-10-CM | POA: Insufficient documentation

## 2010-03-26 DIAGNOSIS — R220 Localized swelling, mass and lump, head: Secondary | ICD-10-CM | POA: Insufficient documentation

## 2010-03-26 DIAGNOSIS — D571 Sickle-cell disease without crisis: Secondary | ICD-10-CM | POA: Insufficient documentation

## 2010-03-27 ENCOUNTER — Emergency Department (HOSPITAL_COMMUNITY): Payer: Medicare Other

## 2010-03-27 LAB — DIFFERENTIAL
Basophils Relative: 1 % (ref 0–1)
Eosinophils Absolute: 0.3 10*3/uL (ref 0.0–0.7)
Lymphocytes Relative: 55 % — ABNORMAL HIGH (ref 12–46)
Lymphs Abs: 8.8 10*3/uL — ABNORMAL HIGH (ref 0.7–4.0)
Monocytes Absolute: 2.1 10*3/uL — ABNORMAL HIGH (ref 0.1–1.0)
Neutro Abs: 4.6 10*3/uL (ref 1.7–7.7)

## 2010-03-27 LAB — BASIC METABOLIC PANEL
BUN: 25 mg/dL — ABNORMAL HIGH (ref 6–23)
Calcium: 8.8 mg/dL (ref 8.4–10.5)
Chloride: 106 mEq/L (ref 96–112)
Creatinine, Ser: 1.51 mg/dL — ABNORMAL HIGH (ref 0.4–1.5)

## 2010-03-27 LAB — CBC
MCH: 35.8 pg — ABNORMAL HIGH (ref 26.0–34.0)
MCHC: 36.4 g/dL — ABNORMAL HIGH (ref 30.0–36.0)
MCV: 98.3 fL (ref 78.0–100.0)
Platelets: 264 10*3/uL (ref 150–400)
RBC: 2.29 MIL/uL — ABNORMAL LOW (ref 4.22–5.81)

## 2010-03-27 MED ORDER — IOHEXOL 300 MG/ML  SOLN
100.0000 mL | Freq: Once | INTRAMUSCULAR | Status: AC | PRN
  Administered 2010-03-27: 100 mL via INTRAVENOUS

## 2010-03-28 ENCOUNTER — Inpatient Hospital Stay (HOSPITAL_COMMUNITY): Payer: Medicare Other

## 2010-03-28 ENCOUNTER — Emergency Department (HOSPITAL_COMMUNITY)
Admission: EM | Admit: 2010-03-28 | Discharge: 2010-03-28 | Disposition: A | Discharge status: Other Acute Inpatient Hospital | Payer: Medicare Other | Source: Home / Self Care | Attending: Emergency Medicine | Admitting: Emergency Medicine

## 2010-03-28 ENCOUNTER — Emergency Department (HOSPITAL_COMMUNITY): Payer: Medicare Other

## 2010-03-28 ENCOUNTER — Encounter (HOSPITAL_COMMUNITY): Payer: Self-pay

## 2010-03-28 ENCOUNTER — Inpatient Hospital Stay (HOSPITAL_COMMUNITY)
Admission: AD | Admit: 2010-03-28 | Discharge: 2010-03-31 | DRG: 812 | Disposition: A | Discharge status: Home / Self Care | Payer: Medicare Other | Source: Ambulatory Visit | Attending: Family Medicine | Admitting: Family Medicine

## 2010-03-28 DIAGNOSIS — M545 Low back pain, unspecified: Secondary | ICD-10-CM | POA: Insufficient documentation

## 2010-03-28 DIAGNOSIS — D72829 Elevated white blood cell count, unspecified: Secondary | ICD-10-CM | POA: Diagnosis present

## 2010-03-28 DIAGNOSIS — Z8673 Personal history of transient ischemic attack (TIA), and cerebral infarction without residual deficits: Secondary | ICD-10-CM

## 2010-03-28 DIAGNOSIS — N182 Chronic kidney disease, stage 2 (mild): Secondary | ICD-10-CM | POA: Diagnosis present

## 2010-03-28 DIAGNOSIS — K116 Mucocele of salivary gland: Secondary | ICD-10-CM | POA: Diagnosis present

## 2010-03-28 DIAGNOSIS — D649 Anemia, unspecified: Secondary | ICD-10-CM | POA: Diagnosis present

## 2010-03-28 DIAGNOSIS — N289 Disorder of kidney and ureter, unspecified: Secondary | ICD-10-CM | POA: Diagnosis present

## 2010-03-28 DIAGNOSIS — D5700 Hb-SS disease with crisis, unspecified: Secondary | ICD-10-CM | POA: Insufficient documentation

## 2010-03-28 DIAGNOSIS — M79609 Pain in unspecified limb: Secondary | ICD-10-CM | POA: Insufficient documentation

## 2010-03-28 DIAGNOSIS — R809 Proteinuria, unspecified: Secondary | ICD-10-CM | POA: Diagnosis present

## 2010-03-28 DIAGNOSIS — R51 Headache: Secondary | ICD-10-CM | POA: Diagnosis present

## 2010-03-28 LAB — CBC
HCT: 20.1 % — ABNORMAL LOW (ref 39.0–52.0)
MCH: 35 pg — ABNORMAL HIGH (ref 26.0–34.0)
MCHC: 35.8 g/dL (ref 30.0–36.0)
MCV: 97.6 fL (ref 78.0–100.0)
MCV: 98.2 fL (ref 78.0–100.0)
Platelets: 257 10*3/uL (ref 150–400)
RBC: 2.27 MIL/uL — ABNORMAL LOW (ref 4.22–5.81)
RDW: 22.6 % — ABNORMAL HIGH (ref 11.5–15.5)
RDW: 22.4 % — ABNORMAL HIGH (ref 11.5–15.5)
WBC: 24.5 10*3/uL — ABNORMAL HIGH (ref 4.0–10.5)

## 2010-03-28 LAB — URINALYSIS, ROUTINE W REFLEX MICROSCOPIC
Nitrite: NEGATIVE
Protein, ur: 30 mg/dL — AB
Specific Gravity, Urine: 1.012 (ref 1.005–1.030)
Urobilinogen, UA: 0.2 mg/dL (ref 0.0–1.0)

## 2010-03-28 LAB — DIFFERENTIAL
Eosinophils Relative: 0 % (ref 0–5)
Lymphs Abs: 9.8 10*3/uL — ABNORMAL HIGH (ref 0.7–4.0)
Monocytes Relative: 9 % (ref 3–12)
Neutro Abs: 11.4 10*3/uL — ABNORMAL HIGH (ref 1.7–7.7)

## 2010-03-28 LAB — POCT I-STAT, CHEM 8
Creatinine, Ser: 4 mg/dL — ABNORMAL HIGH (ref 0.4–1.5)
Glucose, Bld: 125 mg/dL — ABNORMAL HIGH (ref 70–99)
Hemoglobin: 7.1 g/dL — ABNORMAL LOW (ref 13.0–17.0)
Sodium: 140 mEq/L (ref 135–145)
TCO2: 21 mmol/L (ref 0–100)

## 2010-03-28 LAB — PATHOLOGIST SMEAR REVIEW

## 2010-03-28 LAB — TYPE AND SCREEN
ABO/RH(D): O POS
Antibody Screen: NEGATIVE

## 2010-03-28 LAB — BASIC METABOLIC PANEL
CO2: 22 mEq/L (ref 19–32)
Calcium: 9.4 mg/dL (ref 8.4–10.5)
Creatinine, Ser: 2.04 mg/dL — ABNORMAL HIGH (ref 0.4–1.5)
GFR calc Af Amer: 48 mL/min — ABNORMAL LOW (ref >60)
GFR calc non Af Amer: 39 mL/min — ABNORMAL LOW (ref >60)
Sodium: 140 mEq/L (ref 135–145)

## 2010-03-28 LAB — URINE MICROSCOPIC-ADD ON

## 2010-03-28 LAB — CREATININE, URINE, RANDOM: Creatinine, Urine: 36.8 mg/dL

## 2010-03-28 LAB — RETICULOCYTES: Retic Count, Absolute: 300.8 10*3/uL — ABNORMAL HIGH (ref 19.0–186.0)

## 2010-03-29 DIAGNOSIS — D5700 Hb-SS disease with crisis, unspecified: Secondary | ICD-10-CM

## 2010-03-29 DIAGNOSIS — G971 Other reaction to spinal and lumbar puncture: Secondary | ICD-10-CM

## 2010-03-29 LAB — COMPREHENSIVE METABOLIC PANEL
ALT: 21 U/L (ref 0–53)
AST: 44 U/L — ABNORMAL HIGH (ref 0–37)
CO2: 20 mEq/L (ref 19–32)
Chloride: 112 mEq/L (ref 96–112)
GFR calc Af Amer: >60 mL/min (ref >60)
GFR calc non Af Amer: 60 mL/min — ABNORMAL LOW (ref >60)
Glucose, Bld: 122 mg/dL — ABNORMAL HIGH (ref 70–99)
Sodium: 138 mEq/L (ref 135–145)
Total Bilirubin: 2.6 mg/dL — ABNORMAL HIGH (ref 0.3–1.2)

## 2010-03-29 LAB — CBC
Hemoglobin: 7.5 g/dL — ABNORMAL LOW (ref 13.0–17.0)
MCH: 34.7 pg — ABNORMAL HIGH (ref 26.0–34.0)
MCHC: 35.2 g/dL (ref 30.0–36.0)
MCV: 98.6 fL (ref 78.0–100.0)
Platelets: 279 10*3/uL (ref 150–400)
RBC: 2.16 MIL/uL — ABNORMAL LOW (ref 4.22–5.81)

## 2010-03-29 LAB — RETICULOCYTES: Retic Ct Pct: 9.3 % — ABNORMAL HIGH (ref 0.4–3.1)

## 2010-03-30 LAB — BASIC METABOLIC PANEL
Calcium: 8.7 mg/dL (ref 8.4–10.5)
GFR calc Af Amer: >60 mL/min (ref >60)
GFR calc non Af Amer: 60 mL/min — ABNORMAL LOW (ref >60)
Potassium: 4.5 mEq/L (ref 3.5–5.1)
Sodium: 140 mEq/L (ref 135–145)

## 2010-03-30 LAB — CBC
MCHC: 34.7 g/dL (ref 30.0–36.0)
Platelets: 259 10*3/uL (ref 150–400)
RDW: 21.8 % — ABNORMAL HIGH (ref 11.5–15.5)
WBC: 14.1 10*3/uL — ABNORMAL HIGH (ref 4.0–10.5)

## 2010-04-02 IMAGING — CR DG CHEST 2V
2 series · 2 of 2 positions shown · non-contrast
Comparison: 04/24/2008

CLINICAL DATA: Sickle cell crisis.  Ex-smoker.  No chest
complaints.

CHEST - 2 VIEW

[w chest pa]
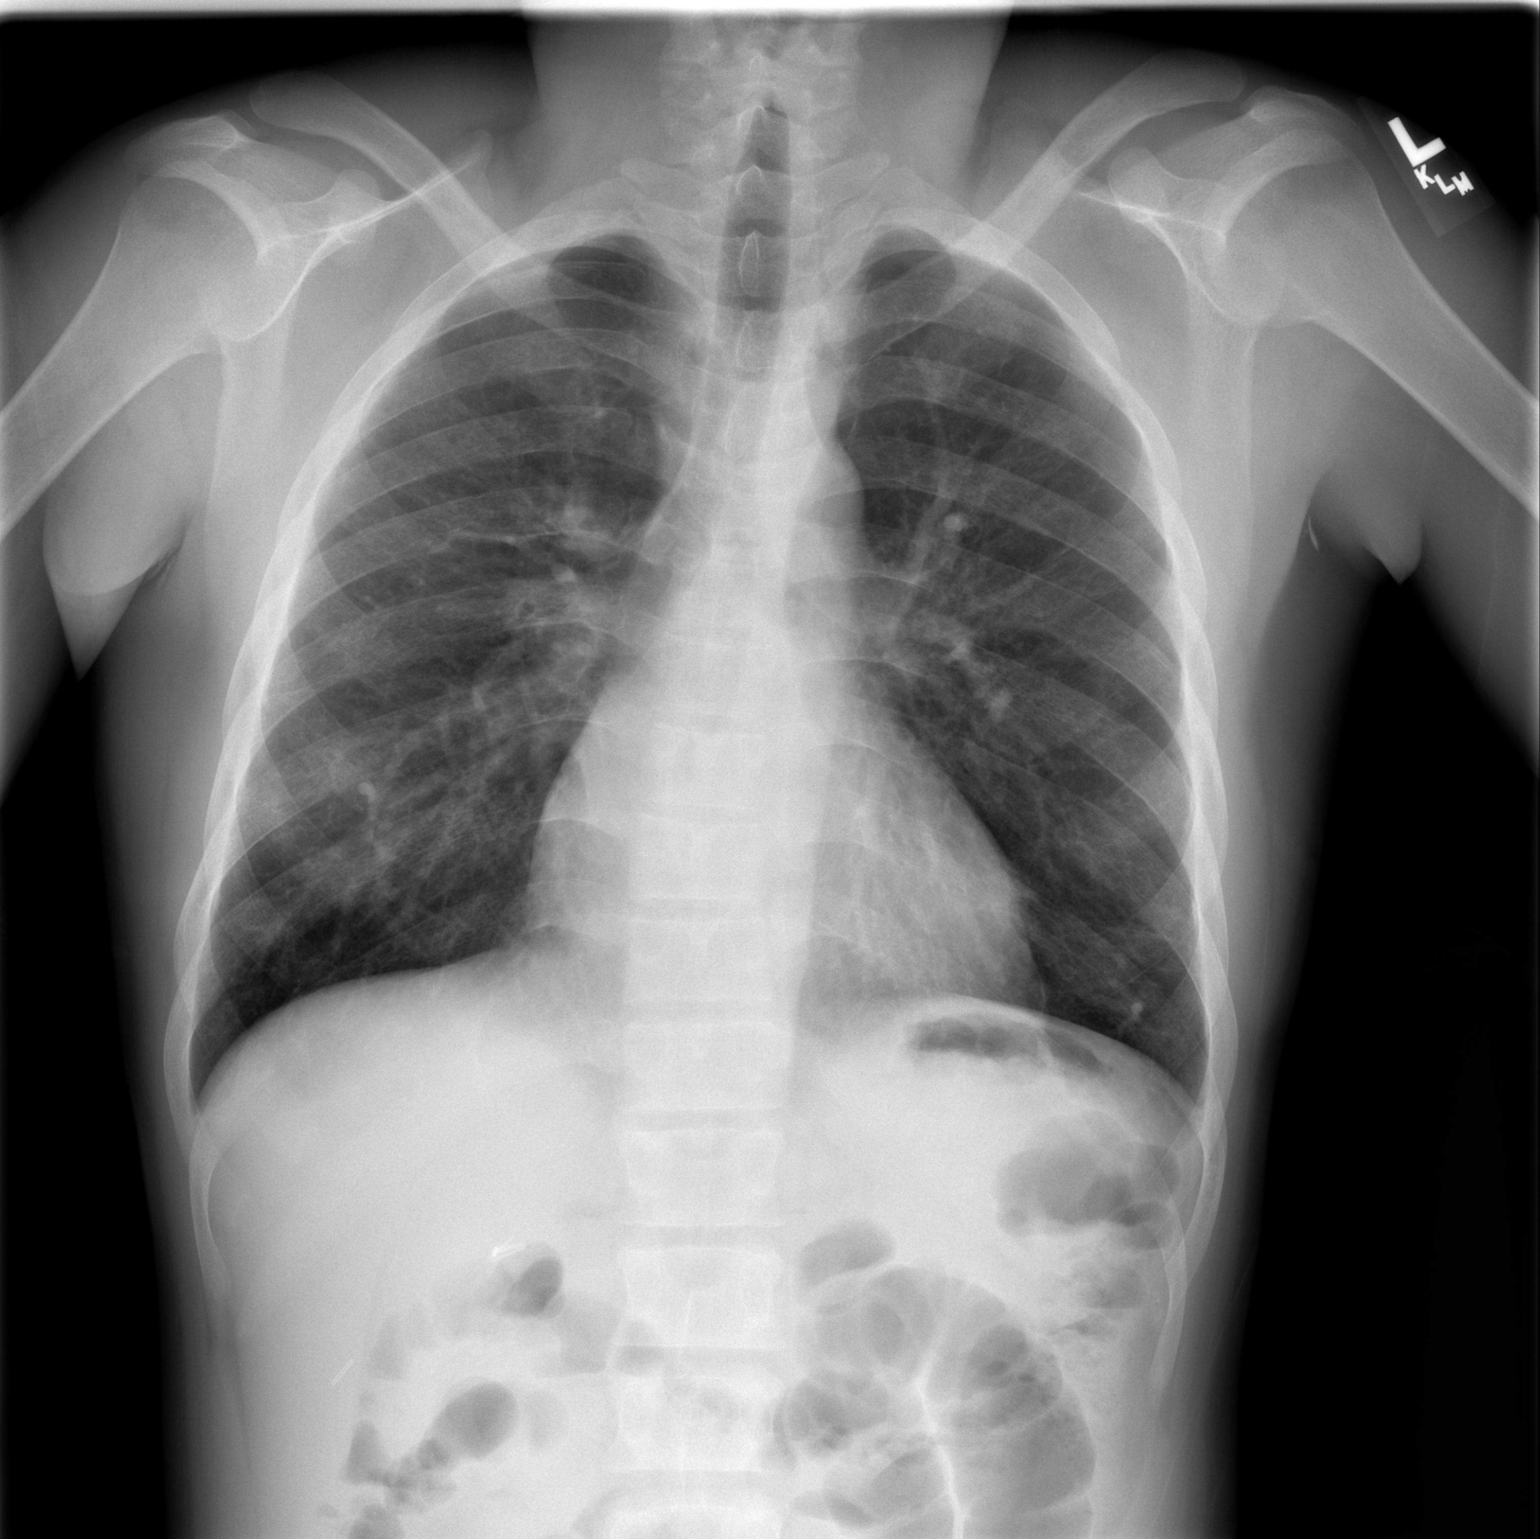

[w chest lat]
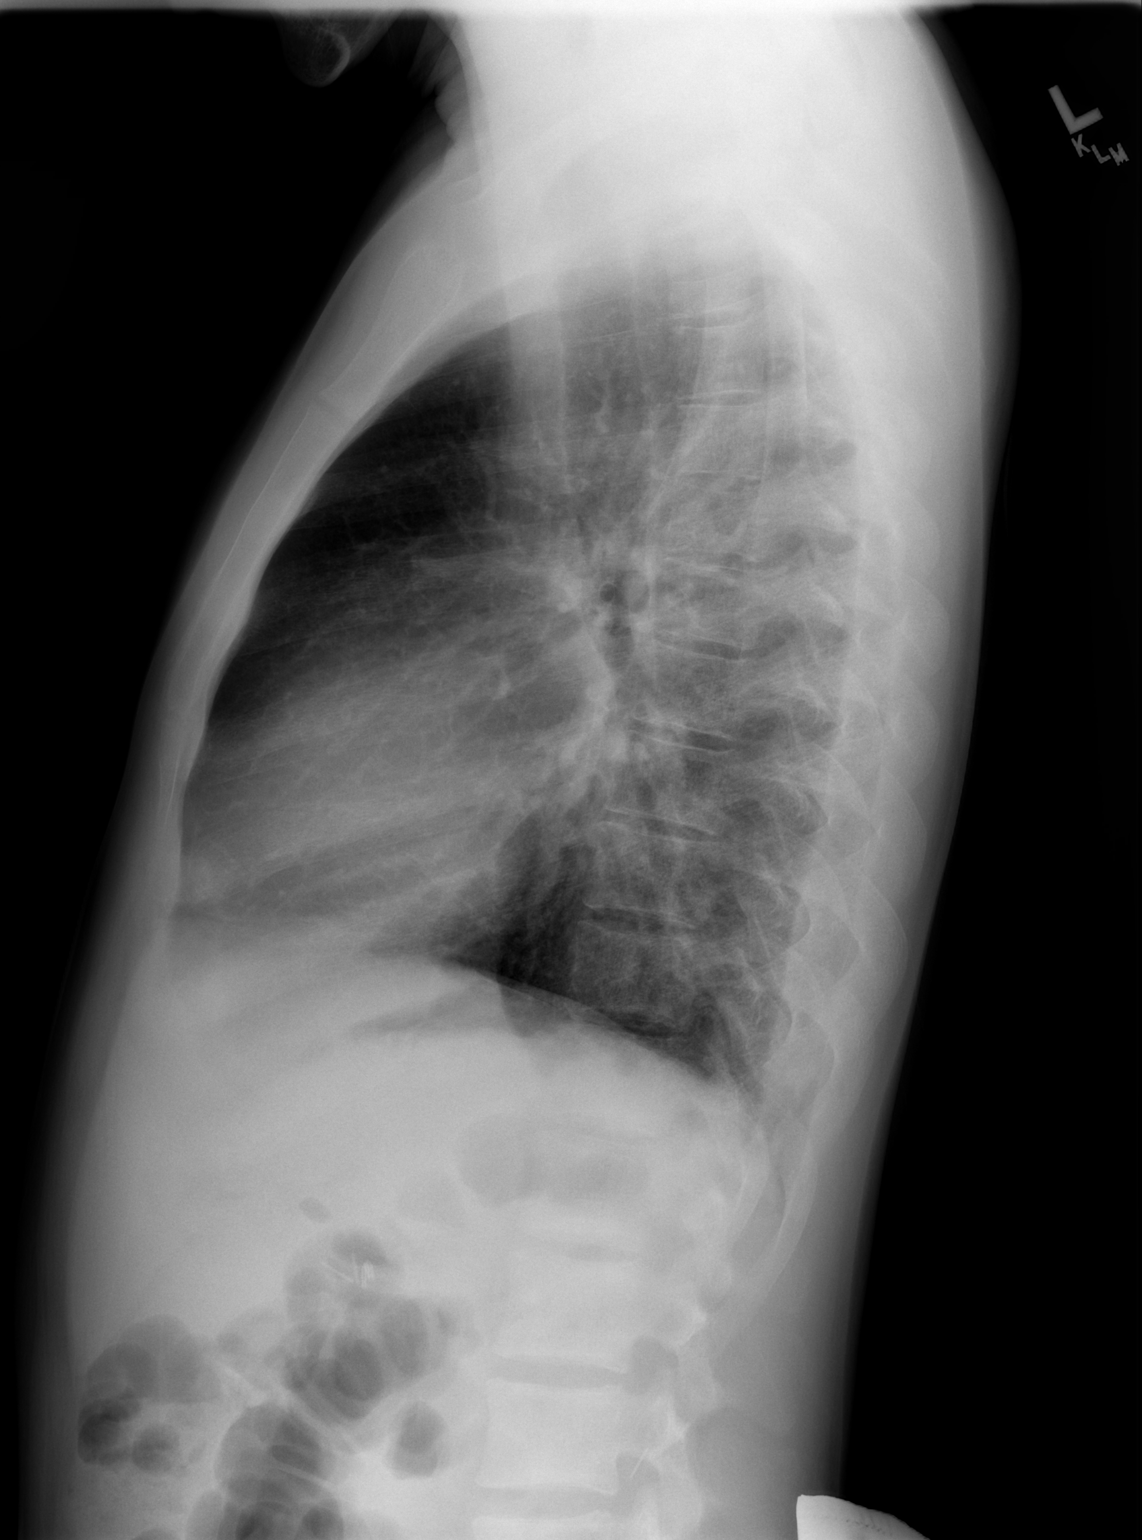

[2 of 2 positions shown; findings below may reference images not displayed]

FINDINGS: Midline trachea.

Normal heart size and mediastinal contours. No pleural effusion or
pneumothorax.

Diffuse interstitial thickening is similar and slightly asymmetric,
greater on the right than left.  Prior cholecystectomy.
IMPRESSION: No interval change since 04/23/2008.  Diffuse chronic interstitial
thickening is most likely related to chronic bronchitis or smoking.
No acute superimposed process.

## 2010-04-05 NOTE — H&P (Signed)
[name] [name], [name] [name] NO.:  [id number]  MEDICAL RECORD NO.:  [id number]           PATIENT TYPE:  I  LOCATION:  [id number]                         FACILITY:  [facility]  PHYSICIAN:  Blane Ohara Roscoe Witts, M.D.DATE OF BIRTH:  07-26-1980  DATE OF ADMISSION:  03/28/2010 DATE OF DISCHARGE:                             HISTORY & PHYSICAL   PRIMARY CARE PROVIDER:  Mylinda Latina, MD, [facility].  CHIEF COMPLAINT:  Pain.  HISTORY OF PRESENT ILLNESS:  A 30 year old male well known to [facility] who presented with pain crisis.  The patient was seen in the ED on March 27, 2010, after eating at [facility] with resulting left facial mass.  CT of neck showed a left parotid cyst.  The patient was started on clindamycin with instructions to follow with ENT.  After leaving the ED, the patient went home and began to have typical pain crisis in the bilateral upper and lower extremities.  He took OxyContin which not relieved pain throughout the day.  He presented to the [facility] ED early this a.m., was treated with Dilaudid 3 mg IV, Zofran, and morphine 4 mg.  Of note, morphine then worsened the headache which the patient already had when he presented.  The patient was also given fentanyl per EMS upon transfer to [facility].  The patient denies any recent illness.  No fever, no chest pain or shortness of breath, no nausea, vomiting, or change in stools, have been well prior to initial visit with facial swelling.  PAST MEDICAL HISTORY: 1. The patient has a history of sickle cell anemia SS, baseline     hemoglobin is 8-9. 2. CKD, stage II, baseline creatinine 1.4. 3. History of constipation. 4. History of acute chest syndrome. 5. History of priapism.  PAST SURGICAL HISTORY:  Status post cholecystectomy.  ALLERGIES:  MANDOL.  MEDICATIONS: 1. OxyContin 30 mg CR b.i.d. 2. Oxycodone 5 mg 1-2 tabs p.o. q.4-6 hours p.r.n. pain. 3. Hydroxyurea 1000 mg  daily. 4. Nortriptyline 25 mg at bedtime. 5. Enalapril 7.5 mg p.o. daily.  FAMILY HISTORY:  The patient's parents both have sickle cell trait.  SOCIAL HISTORY:  The patient is single.  Denies tobacco, illicit drug use.  Occasional alcohol.  PHYSICAL EXAMINATION:  VITAL SIGNS:  Temperature 98.2, heart rate 107, respiratory rate 18, blood pressure 112/68, 100% on room air. GENERAL:  Writhing in pain, alert and oriented. HEENT:  Pinpoint pupils.  Mild icterus.  Tongue moist, however, lips dry and cracking.  Extraocular movements intact.  Full bilateral parotid gland tenderness to palpation of the left parotid with no erythema, no warmth, no drainage noted from duct. NECK:  Tender to palpation in the cervical chain.  No palpable nodes. Pain with range of motion. CVS:  Tachycardic.  Normal S1 and S2.  No murmur. RESPIRATORY:  Decreased at bases, otherwise clear to auscultation. ABDOMEN:  Normoactive bowel sounds, soft, nontender, nondistended. EXTREMITIES:  No edema.  Tender to palpation in the upper and lowerextremities.  There is no erythema or warm extremities. NEUROLOGIC:  No focal deficits.  LABS:  UA; negative leukocytes, nitrites, 30 of protein, moderate blood,  0-2 rbc's.  CBC; white count 23.3, hemoglobin 7.2, hematocrit 20.1, platelets 101.  Differential, ANC 11.4 with 49% neutrophils.  BMET; sodium 140, potassium 4.2, chloride 108, CO2 22, BUN 40, creatinine 2.04, glucose 114, calcium 9.4, reticulocyte count 14.6.  Chest x-ray, mild cardiomegaly and scarring, no acute infiltrates seen.  ASSESSMENT AND PLAN:  A 30 year old male admitted with sickle cell crisis.  1. Sickle cell crisis.  Multiple admissions for crisis, questionable     if this recent neck pain with parotid cyst is developing into an     infection causing the patient's pain crisis, especially with his     leukocytosis.  We will place on fentanyl PCA at the patient's     request with a low basal rate, IV  fluids, no NSAIDs secondary to     acute on chronic renal insufficiency. 2. Anemia.  The patient's baseline hemoglobin 8-9.  We will repeat     this p.m., however, reticulocyte count, transfuse if less than 7 or     clinical course changes. 3. Acute on chronic renal insufficiency, baseline 1.4, avoid     nephrotoxic agents.  The patient received contrast CT of neck on     March 27, 2010.  We will obtain urine electrolytes, sodium, and     creatinine.  Discontinue ACE inhibitor at this time. 4. Fluids, electrolytes, nutrition/gastrointestinal.  P.o. ad lib.     pain control, IV fluids. 5. Parotid cyst with acute crisis, leukocytosis, concern for evolving     infection.  We will continue clindamycin IV and plan to repeat CT     of neck. 6. Proteinuria, known history, however, we will discontinue ACE at     this time. 7. Prophylaxis.  Heparin 5000 subcu q.8 hours. 8. Full code. 9. Disposition, pending clinical improvement.     Vic Blackbird, MD   ______________________________ Acquanetta Sit, M.D.   KD/MEDQ  D:  03/28/2010  T:  03/29/2010  Job:  [id number]  Electronically Signed by Vic Blackbird MD on 03/29/2010 07:49:46 PM Electronically Signed by Lissa Morales M.D. on 04/05/2010 11:23:00 AM

## 2010-04-11 NOTE — Discharge Summary (Signed)
[name] [name], NONNEMACHER            ACCOUNT NO.:  [id number]  MEDICAL RECORD NO.:  [id number]           PATIENT TYPE:  I  LOCATION:  4502                         FACILITY:  [place]  PHYSICIAN:  Blane Ohara McDiarmid, M.D.DATE OF BIRTH:  12-26-1980  DATE OF ADMISSION:  03/28/2010 DATE OF DISCHARGE:  03/31/2010                              DISCHARGE SUMMARY   DISCHARGE DIAGNOSES: 1. Sickle-cell pain crisis. 2. Parotid cyst. 3. Acute on chronic renal insufficiency. 4. History of small cerebral infarct secondary to sickle cell disease.  DISCHARGE MEDICATIONS:   1. Tylenol 650 mg p.o. 4 times daily. 2. Clindamycin 300 mg p.o. every 6 hours to complete a 7-day course. 3. Oxycodone 15 mg tabs 30 mg p.o. every 8-12 hours. 4. Home medications continued:  Hydroxyurea 500 mg 2 tabs p.o. daily. 5. Nortriptyline 25 mg 1 capsule p.o. daily at bedtime. 6. Vasotec 2.5 mg 3 tabs p.o. daily.  PERTINENT LABS AT DISCHARGE:  The patient's creatinine is 1.42. Hemoglobin 6.9, hematocrit 19.9.  STUDIES AT DISCHARGE:  CT of the neck obtained on March 28, 2010 showed no acute finding in the neck, no changes since previous study obtained 1 day ago on March 27, 2010.  CT of the head March 28, 2010 showed no acute finding, unchanged bilateral perisylvian volume loss, worse on the left.  Chest x-ray March 28, 2010, no acute findings, stable from prior exam compared with chest x-ray obtained on March 15, 2010.  CONSULTS:  None.  BRIEF HOSPITAL COURSE:  The patient is a 30 year old male who is admitted with sickle-cell pain crisis.  He is also continued on an antibiotic course for parotid cyst on CT 1 day prior to admission. 1. Pain crisis.  The patient has a history of multiple admissions for     pain crisis.  His pain was in typical areas involving his back and     lower extremities.  He also complained of headache.  He was started     on fentanyl PCA, which he did not per patient really  improve his     pain.  He has been therefore transitioned to Dilaudid PCA for pain     control as well as OxyContin 30 mg with pain relief b.i.d.  At     discharge, he has no pain in his legs or head, but still has     persistent back pain, therefore he is ready to go home.  He     requests Dilaudid p.o. at discharge but that was denied that the     patient is to continue with his home dose OxyContin 30 mg for pain     relief and he is also offered oxycodone 5 mg p.o. q.4 h as needed     for breakthrough pain.  The patient declines oxycodone.  For his     headache, he will continue his home dose of nortriptyline.  His     headache was relieved with 2-3 doses of Toradol 50 mg IV q.6 h.     Renal function was closely monitored with Toradol and did not     negatively affect renal function during  this admission.  The     patient was also hydrated with IV fluids initially 100 mL per hour     then tapered down to 25 mL per hour. 2. Chronic kidney disease.  The patient has known history of chronic     kidney disease.  His creatinine on admission was initially 2.04.     At discharge, it was down to 1.42.  At discharge, the     patient's Toradol was discontinued. 3. Parotid cyst.  The patient was diagnosed with a parotid cyst 1 day     prior to admission.  At that time, he was started on p.o.     clindamycin.  He was continued on p.o. clindamycin during his     hospital stay and he remained afebrile and free or oral pain.     He also had no difficulty with eating.  DISCHARGE PHYSICAL EXAMINATION:  GENERAL:  At discharge, the patient is resting.  He is arousable.  He is in mild distress, noting that he has persistent back pain. HEART:  Within normal limits. LUNGS:  Within normal limits. EXTREMITIES:  Nontender. NEUROLOGIC:  He is alert and oriented x3.  DISCHARGE INSTRUCTIONS:  The patient is discharged to home with the instructions to follow up with his primary MD, Dr. Tye Savoy within  the next at 1-2 weeks which time he may discuss his pain control regimen. He is instructed to continue high-protein, high-fiber diet with no limitations on activity.  DISCHARGE CONDITION:  The patient is discharged to home in stable medical condition.    ______________________________ Boykin Nearing, MD   ______________________________ Blane Ohara McDiarmid, M.D.    JF/MEDQ  D:  04/03/2010  T:  04/04/2010  Job:  [id number]  Electronically Signed by Boykin Nearing MD on 04/07/2010 11:22:11 AM Electronically Signed by Lissa Morales M.D. on 04/11/2010 11:52:09 AM

## 2010-04-26 LAB — CBC
HCT: 21.9 % — ABNORMAL LOW (ref 39.0–52.0)
HCT: 21.9 % — ABNORMAL LOW (ref 39.0–52.0)
HCT: 22.6 % — ABNORMAL LOW (ref 39.0–52.0)
Hemoglobin: 6.4 g/dL — CL (ref 13.0–17.0)
Hemoglobin: 7.8 g/dL — ABNORMAL LOW (ref 13.0–17.0)
Hemoglobin: 7.9 g/dL — ABNORMAL LOW (ref 13.0–17.0)
MCH: 34.5 pg — ABNORMAL HIGH (ref 26.0–34.0)
MCH: 37.6 pg — ABNORMAL HIGH (ref 26.0–34.0)
MCHC: 35 g/dL (ref 30.0–36.0)
MCHC: 35.6 g/dL (ref 30.0–36.0)
MCHC: 35.8 g/dL (ref 30.0–36.0)
MCHC: 36.1 g/dL — ABNORMAL HIGH (ref 30.0–36.0)
MCHC: 36.1 g/dL — ABNORMAL HIGH (ref 30.0–36.0)
MCHC: 36.4 g/dL — ABNORMAL HIGH (ref 30.0–36.0)
MCV: 95.6 fL (ref 78.0–100.0)
MCV: 98.1 fL (ref 78.0–100.0)
Platelets: 194 10*3/uL (ref 150–400)
Platelets: 228 10*3/uL (ref 150–400)
RBC: 1.89 MIL/uL — ABNORMAL LOW (ref 4.22–5.81)
RBC: 2.1 MIL/uL — ABNORMAL LOW (ref 4.22–5.81)
RDW: 20.9 % — ABNORMAL HIGH (ref 11.5–15.5)
RDW: 21.1 % — ABNORMAL HIGH (ref 11.5–15.5)
RDW: 22.3 % — ABNORMAL HIGH (ref 11.5–15.5)
RDW: 22.6 % — ABNORMAL HIGH (ref 11.5–15.5)
WBC: 14.2 10*3/uL — ABNORMAL HIGH (ref 4.0–10.5)
WBC: 15 10*3/uL — ABNORMAL HIGH (ref 4.0–10.5)

## 2010-04-26 LAB — URINALYSIS, ROUTINE W REFLEX MICROSCOPIC
Protein, ur: NEGATIVE mg/dL
Urobilinogen, UA: 1 mg/dL (ref 0.0–1.0)

## 2010-04-26 LAB — DIFFERENTIAL
Basophils Absolute: 0 10*3/uL (ref 0.0–0.1)
Basophils Relative: 0 % (ref 0–1)
Eosinophils Absolute: 0.3 10*3/uL (ref 0.0–0.7)
Lymphocytes Relative: 41 % (ref 12–46)
Monocytes Relative: 11 % (ref 3–12)
Neutrophils Relative %: 46 % (ref 43–77)

## 2010-04-26 LAB — URINE CULTURE

## 2010-04-26 LAB — BASIC METABOLIC PANEL
BUN: 13 mg/dL (ref 6–23)
BUN: 19 mg/dL (ref 6–23)
BUN: 20 mg/dL (ref 6–23)
CO2: 17 mEq/L — ABNORMAL LOW (ref 19–32)
CO2: 18 mEq/L — ABNORMAL LOW (ref 19–32)
Calcium: 8.2 mg/dL — ABNORMAL LOW (ref 8.4–10.5)
Calcium: 8.5 mg/dL (ref 8.4–10.5)
Calcium: 8.6 mg/dL (ref 8.4–10.5)
Calcium: 9.1 mg/dL (ref 8.4–10.5)
Chloride: 111 mEq/L (ref 96–112)
Chloride: 120 mEq/L — ABNORMAL HIGH (ref 96–112)
Creatinine, Ser: 1.64 mg/dL — ABNORMAL HIGH (ref 0.4–1.5)
GFR calc Af Amer: >60 mL/min (ref >60)
GFR calc Af Amer: >60 mL/min (ref >60)
GFR calc Af Amer: >60 mL/min (ref >60)
GFR calc non Af Amer: 51 mL/min — ABNORMAL LOW (ref >60)
GFR calc non Af Amer: 53 mL/min — ABNORMAL LOW (ref >60)
GFR calc non Af Amer: 58 mL/min — ABNORMAL LOW (ref >60)
GFR calc non Af Amer: >60 mL/min (ref >60)
GFR calc non Af Amer: >60 mL/min (ref >60)
Glucose, Bld: 158 mg/dL — ABNORMAL HIGH (ref 70–99)
Glucose, Bld: 89 mg/dL (ref 70–99)
Glucose, Bld: 93 mg/dL (ref 70–99)
Glucose, Bld: 94 mg/dL (ref 70–99)
Potassium: 4.1 mEq/L (ref 3.5–5.1)
Potassium: 4.2 mEq/L (ref 3.5–5.1)
Sodium: 136 mEq/L (ref 135–145)
Sodium: 136 mEq/L (ref 135–145)
Sodium: 139 mEq/L (ref 135–145)
Sodium: 139 mEq/L (ref 135–145)
Sodium: 141 mEq/L (ref 135–145)

## 2010-04-26 LAB — RETICULOCYTES
RBC.: 1.88 MIL/uL — ABNORMAL LOW (ref 4.22–5.81)
RBC.: 1.89 MIL/uL — ABNORMAL LOW (ref 4.22–5.81)
RBC.: 2.01 MIL/uL — ABNORMAL LOW (ref 4.22–5.81)
Retic Count, Absolute: 227.1 10*3/uL — ABNORMAL HIGH (ref 19.0–186.0)
Retic Count, Absolute: 230.6 10*3/uL — ABNORMAL HIGH (ref 19.0–186.0)
Retic Count, Absolute: 246.3 10*3/uL — ABNORMAL HIGH (ref 19.0–186.0)
Retic Count, Absolute: 315.8 10*3/uL — ABNORMAL HIGH (ref 19.0–186.0)
Retic Ct Pct: 11.3 % — ABNORMAL HIGH (ref 0.4–3.1)
Retic Ct Pct: 12.1 % — ABNORMAL HIGH (ref 0.4–3.1)
Retic Ct Pct: 13.1 % — ABNORMAL HIGH (ref 0.4–3.1)

## 2010-04-26 LAB — CULTURE, RESPIRATORY W GRAM STAIN: Culture: NORMAL

## 2010-04-26 LAB — CROSSMATCH
ABO/RH(D): O POS
Antibody Screen: NEGATIVE
Unit division: 0

## 2010-04-26 LAB — COMPREHENSIVE METABOLIC PANEL
AST: 45 U/L — ABNORMAL HIGH (ref 0–37)
Albumin: 3.2 g/dL — ABNORMAL LOW (ref 3.5–5.2)
Calcium: 8.4 mg/dL (ref 8.4–10.5)
Creatinine, Ser: 1.45 mg/dL (ref 0.4–1.5)
GFR calc Af Amer: >60 mL/min (ref >60)

## 2010-04-26 LAB — EXPECTORATED SPUTUM ASSESSMENT W GRAM STAIN, RFLX TO RESP C

## 2010-04-26 LAB — CULTURE, BLOOD (ROUTINE X 2)
Culture  Setup Time: 201111140005
Culture: NO GROWTH

## 2010-04-26 LAB — BRAIN NATRIURETIC PEPTIDE: Pro B Natriuretic peptide (BNP): 1338 pg/mL — ABNORMAL HIGH (ref 0.0–100.0)

## 2010-04-28 LAB — CBC
HCT: 23.7 % — ABNORMAL LOW (ref 39.0–52.0)
HCT: 24.4 % — ABNORMAL LOW (ref 39.0–52.0)
Hemoglobin: 8 g/dL — ABNORMAL LOW (ref 13.0–17.0)
Hemoglobin: 8.4 g/dL — ABNORMAL LOW (ref 13.0–17.0)
Hemoglobin: 8.7 g/dL — ABNORMAL LOW (ref 13.0–17.0)
MCH: 39.9 pg — ABNORMAL HIGH (ref 26.0–34.0)
MCH: 45.9 pg — ABNORMAL HIGH (ref 26.0–34.0)
MCH: 47.7 pg — ABNORMAL HIGH (ref 26.0–34.0)
MCHC: 35 g/dL (ref 30.0–36.0)
MCHC: 34.7 g/dL (ref 30.0–36.0)
MCHC: 35.5 g/dL (ref 30.0–36.0)
MCV: 114 fL — ABNORMAL HIGH (ref 78.0–100.0)
MCV: 121.4 fL — ABNORMAL HIGH (ref 78.0–100.0)
MCV: 132.7 fL — ABNORMAL HIGH (ref 78.0–100.0)
Platelets: 284 10*3/uL (ref 150–400)
Platelets: 121 10*3/uL — ABNORMAL LOW (ref 150–400)
RBC: 1.68 MIL/uL — ABNORMAL LOW (ref 4.22–5.81)
RBC: 1.81 MIL/uL — ABNORMAL LOW (ref 4.22–5.81)
RBC: 2.01 MIL/uL — ABNORMAL LOW (ref 4.22–5.81)
RDW: 19.8 % — ABNORMAL HIGH (ref 11.5–15.5)
WBC: 10.4 10*3/uL (ref 4.0–10.5)
WBC: 12.1 10*3/uL — ABNORMAL HIGH (ref 4.0–10.5)
WBC: 12.5 10*3/uL — ABNORMAL HIGH (ref 4.0–10.5)
WBC: 9.8 10*3/uL (ref 4.0–10.5)

## 2010-04-28 LAB — BASIC METABOLIC PANEL
BUN: 18 mg/dL (ref 6–23)
Calcium: 8.7 mg/dL (ref 8.4–10.5)
Calcium: 8.9 mg/dL (ref 8.4–10.5)
Chloride: 114 mEq/L — ABNORMAL HIGH (ref 96–112)
Creatinine, Ser: 1.95 mg/dL — ABNORMAL HIGH (ref 0.4–1.5)
GFR calc Af Amer: 51 mL/min — ABNORMAL LOW (ref >60)
GFR calc Af Amer: 58 mL/min — ABNORMAL LOW (ref >60)
GFR calc Af Amer: >60 mL/min (ref >60)
GFR calc non Af Amer: 42 mL/min — ABNORMAL LOW (ref >60)
GFR calc non Af Amer: 48 mL/min — ABNORMAL LOW (ref >60)
Glucose, Bld: 79 mg/dL (ref 70–99)
Potassium: 4.9 mEq/L (ref 3.5–5.1)
Potassium: 5.4 mEq/L — ABNORMAL HIGH (ref 3.5–5.1)
Sodium: 136 mEq/L (ref 135–145)
Sodium: 136 mEq/L (ref 135–145)
Sodium: 138 mEq/L (ref 135–145)

## 2010-04-28 LAB — URINALYSIS, ROUTINE W REFLEX MICROSCOPIC
Bilirubin Urine: NEGATIVE
Bilirubin Urine: NEGATIVE
Hgb urine dipstick: NEGATIVE
Hgb urine dipstick: NEGATIVE
Ketones, ur: NEGATIVE mg/dL
Nitrite: NEGATIVE
Protein, ur: NEGATIVE mg/dL
Specific Gravity, Urine: 1.011 (ref 1.005–1.030)
Specific Gravity, Urine: 1.012 (ref 1.005–1.030)
Urobilinogen, UA: 1 mg/dL (ref 0.0–1.0)
pH: 6 (ref 5.0–8.0)

## 2010-04-28 LAB — URINALYSIS, MICROSCOPIC ONLY
Ketones, ur: NEGATIVE mg/dL
Leukocytes, UA: NEGATIVE
Nitrite: NEGATIVE
Protein, ur: NEGATIVE mg/dL
Urine-Other: NONE SEEN
pH: 6 (ref 5.0–8.0)

## 2010-04-28 LAB — CARDIAC PANEL(CRET KIN+CKTOT+MB+TROPI)
CK, MB: 0.9 ng/mL (ref 0.3–4.0)
CK, MB: 0.9 ng/mL (ref 0.3–4.0)
Relative Index: INVALID (ref 0.0–2.5)
Total CK: 57 U/L (ref 7–232)
Troponin I: 0.01 ng/mL (ref 0.00–0.06)

## 2010-04-28 LAB — VITAMIN B12: Vitamin B-12: 355 pg/mL (ref 211–911)

## 2010-04-28 LAB — CROSSMATCH
Antibody Screen: NEGATIVE
Donor AG Type: NEGATIVE

## 2010-04-28 LAB — DIFFERENTIAL
Basophils Absolute: 0.1 10*3/uL (ref 0.0–0.1)
Basophils Relative: 0 % (ref 0–1)
Eosinophils Absolute: 0.2 10*3/uL (ref 0.0–0.7)
Eosinophils Relative: 2 % (ref 0–5)
Eosinophils Relative: 2 % (ref 0–5)
Lymphocytes Relative: 30 % (ref 12–46)
Lymphocytes Relative: 56 % — ABNORMAL HIGH (ref 12–46)
Lymphs Abs: 5.2 10*3/uL — ABNORMAL HIGH (ref 0.7–4.0)
Lymphs Abs: 6.9 10*3/uL — ABNORMAL HIGH (ref 0.7–4.0)
Monocytes Absolute: 1 10*3/uL (ref 0.1–1.0)
Monocytes Absolute: 0.5 10*3/uL (ref 0.1–1.0)
Monocytes Relative: 6 % (ref 3–12)
Neutro Abs: 4 10*3/uL (ref 1.7–7.7)
Neutro Abs: 4.2 10*3/uL (ref 1.7–7.7)
Neutrophils Relative %: 63 % (ref 43–77)

## 2010-04-28 LAB — RETICULOCYTES
RBC.: 1.69 MIL/uL — ABNORMAL LOW (ref 4.22–5.81)
Retic Count, Absolute: 116.6 10*3/uL (ref 19.0–186.0)
Retic Ct Pct: 6.9 % — ABNORMAL HIGH (ref 0.4–3.1)
Retic Ct Pct: 7.4 % — ABNORMAL HIGH (ref 0.4–3.1)

## 2010-04-28 LAB — COMPREHENSIVE METABOLIC PANEL
AST: 50 U/L — ABNORMAL HIGH (ref 0–37)
Albumin: 4.1 g/dL (ref 3.5–5.2)
Calcium: 8.5 mg/dL (ref 8.4–10.5)
Creatinine, Ser: 1.53 mg/dL — ABNORMAL HIGH (ref 0.4–1.5)
GFR calc Af Amer: >60 mL/min (ref >60)
Total Protein: 7.3 g/dL (ref 6.0–8.3)

## 2010-04-28 LAB — FOLATE: Folate: 10.8 ng/mL

## 2010-04-28 LAB — ABO/RH: ABO/RH(D): O POS

## 2010-04-28 LAB — URINE CULTURE: Special Requests: NEGATIVE

## 2010-04-28 LAB — IRON AND TIBC
Saturation Ratios: 23 % (ref 20–55)
UIBC: 176 ug/dL

## 2010-05-01 LAB — COMPREHENSIVE METABOLIC PANEL
AST: 46 U/L — ABNORMAL HIGH (ref 0–37)
Albumin: 3.9 g/dL (ref 3.5–5.2)
BUN: 27 mg/dL — ABNORMAL HIGH (ref 6–23)
CO2: 20 mEq/L (ref 19–32)
Calcium: 9.1 mg/dL (ref 8.4–10.5)
Chloride: 110 mEq/L (ref 96–112)
Creatinine, Ser: 1.59 mg/dL — ABNORMAL HIGH (ref 0.4–1.5)
GFR calc Af Amer: >60 mL/min (ref >60)
GFR calc non Af Amer: 41 mL/min — ABNORMAL LOW (ref >60)
Glucose, Bld: 101 mg/dL — ABNORMAL HIGH (ref 70–99)
Potassium: 4.6 mEq/L (ref 3.5–5.1)
Total Bilirubin: 4.7 mg/dL — ABNORMAL HIGH (ref 0.3–1.2)
Total Protein: 6.9 g/dL (ref 6.0–8.3)
Total Protein: 7.5 g/dL (ref 6.0–8.3)

## 2010-05-01 LAB — CBC
HCT: 18.3 % — ABNORMAL LOW (ref 39.0–52.0)
HCT: 20.1 % — ABNORMAL LOW (ref 39.0–52.0)
HCT: 20.3 % — ABNORMAL LOW (ref 39.0–52.0)
Hemoglobin: 7 g/dL — ABNORMAL LOW (ref 13.0–17.0)
Hemoglobin: 7.2 g/dL — ABNORMAL LOW (ref 13.0–17.0)
Hemoglobin: 7.3 g/dL — ABNORMAL LOW (ref 13.0–17.0)
MCHC: 34.8 g/dL (ref 30.0–36.0)
MCHC: 35.7 g/dL (ref 30.0–36.0)
MCHC: 35.9 g/dL (ref 30.0–36.0)
MCHC: 36 g/dL (ref 30.0–36.0)
MCV: 116.5 fL — ABNORMAL HIGH (ref 78.0–100.0)
MCV: 116.9 fL — ABNORMAL HIGH (ref 78.0–100.0)
MCV: 124 fL — ABNORMAL HIGH (ref 78.0–100.0)
MCV: 125.3 fL — ABNORMAL HIGH (ref 78.0–100.0)
Platelets: 182 10*3/uL (ref 150–400)
Platelets: 198 10*3/uL (ref 150–400)
RBC: 1.65 MIL/uL — ABNORMAL LOW (ref 4.22–5.81)
RBC: 1.72 MIL/uL — ABNORMAL LOW (ref 4.22–5.81)
RDW: 19.5 % — ABNORMAL HIGH (ref 11.5–15.5)
RDW: 20.4 % — ABNORMAL HIGH (ref 11.5–15.5)
RDW: 28.9 % — ABNORMAL HIGH (ref 11.5–15.5)
WBC: 10 10*3/uL (ref 4.0–10.5)
WBC: 11.2 10*3/uL — ABNORMAL HIGH (ref 4.0–10.5)
WBC: 16.5 10*3/uL — ABNORMAL HIGH (ref 4.0–10.5)

## 2010-05-01 LAB — URINALYSIS, ROUTINE W REFLEX MICROSCOPIC
Bilirubin Urine: NEGATIVE
Ketones, ur: NEGATIVE mg/dL
Nitrite: NEGATIVE
pH: 6 (ref 5.0–8.0)

## 2010-05-01 LAB — DIFFERENTIAL
Basophils Absolute: 0 10*3/uL (ref 0.0–0.1)
Basophils Absolute: 0.2 10*3/uL — ABNORMAL HIGH (ref 0.0–0.1)
Eosinophils Absolute: 0.4 10*3/uL (ref 0.0–0.7)
Eosinophils Relative: 2 % (ref 0–5)
Lymphs Abs: 2.8 10*3/uL (ref 0.7–4.0)
Monocytes Absolute: 1.7 10*3/uL — ABNORMAL HIGH (ref 0.1–1.0)
Monocytes Relative: 10 % (ref 3–12)
Neutro Abs: 10.2 10*3/uL — ABNORMAL HIGH (ref 1.7–7.7)
Neutrophils Relative %: 61 % (ref 43–77)

## 2010-05-01 LAB — RETICULOCYTES
RBC.: 1.65 MIL/uL — ABNORMAL LOW (ref 4.22–5.81)
RBC.: 1.98 MIL/uL — ABNORMAL LOW (ref 4.22–5.81)
Retic Count, Absolute: 283.1 10*3/uL — ABNORMAL HIGH (ref 19.0–186.0)
Retic Count, Absolute: 66 10*3/uL (ref 19.0–186.0)

## 2010-05-01 LAB — BASIC METABOLIC PANEL
BUN: 23 mg/dL (ref 6–23)
CO2: 20 mEq/L (ref 19–32)
CO2: 22 mEq/L (ref 19–32)
Calcium: 8.5 mg/dL (ref 8.4–10.5)
Chloride: 110 mEq/L (ref 96–112)
Creatinine, Ser: 1.4 mg/dL (ref 0.4–1.5)
GFR calc Af Amer: >60 mL/min (ref >60)
GFR calc non Af Amer: 49 mL/min — ABNORMAL LOW (ref >60)
GFR calc non Af Amer: >60 mL/min (ref >60)
Glucose, Bld: 101 mg/dL — ABNORMAL HIGH (ref 70–99)
Potassium: 4.1 mEq/L (ref 3.5–5.1)
Sodium: 139 mEq/L (ref 135–145)

## 2010-05-01 LAB — CROSSMATCH: Donor AG Type: NEGATIVE

## 2010-05-01 LAB — URINE MICROSCOPIC-ADD ON

## 2010-05-01 LAB — LIPASE, BLOOD: Lipase: 17 U/L (ref 11–59)

## 2010-05-01 LAB — SODIUM, URINE, RANDOM: Sodium, Ur: 74 mEq/L

## 2010-05-03 ENCOUNTER — Ambulatory Visit (INDEPENDENT_AMBULATORY_CARE_PROVIDER_SITE_OTHER): Payer: Medicare Other | Admitting: Family Medicine

## 2010-05-03 ENCOUNTER — Encounter: Payer: Self-pay | Admitting: Family Medicine

## 2010-05-03 ENCOUNTER — Other Ambulatory Visit: Payer: Self-pay | Admitting: Family Medicine

## 2010-05-03 VITALS — BP 114/75 | HR 102 | Temp 98.4°F

## 2010-05-03 DIAGNOSIS — D5700 Hb-SS disease with crisis, unspecified: Secondary | ICD-10-CM

## 2010-05-03 LAB — BASIC METABOLIC PANEL
BUN: 23 mg/dL (ref 6–23)
Chloride: 106 mEq/L (ref 96–112)
Glucose, Bld: 94 mg/dL (ref 70–99)
Potassium: 4.7 mEq/L (ref 3.5–5.3)
Sodium: 136 mEq/L (ref 135–145)

## 2010-05-03 LAB — DIFFERENTIAL
Basophils Relative: 0 % (ref 0–1)
Eosinophils Absolute: 0.3 10*3/uL (ref 0.0–0.7)
Eosinophils Relative: 3 % (ref 0–5)
Lymphs Abs: 5.3 10*3/uL — ABNORMAL HIGH (ref 0.7–4.0)
Monocytes Absolute: 1.2 10*3/uL — ABNORMAL HIGH (ref 0.1–1.0)
Monocytes Relative: 10 % (ref 3–12)
Neutrophils Relative %: 42 % — ABNORMAL LOW (ref 43–77)

## 2010-05-03 MED ORDER — OXYCODONE HCL 30 MG PO TB12: 30.0000 mg | ORAL_TABLET | Freq: Two times a day (BID) | ORAL | Status: DC

## 2010-05-03 MED ORDER — KETOROLAC TROMETHAMINE 30 MG/ML IJ SOLN
30.0000 mg | Freq: Once | INTRAMUSCULAR | Status: AC
  Administered 2010-05-03: 30 mg via INTRAMUSCULAR

## 2010-05-03 MED ORDER — OXYCODONE-ACETAMINOPHEN 10-325 MG PO TABS: 1.0000 | ORAL_TABLET | Freq: Four times a day (QID) | ORAL | Status: DC | PRN

## 2010-05-03 NOTE — Progress Notes (Signed)
Addended by: Audelia Hives on: 05/03/2010 12:04 PM   Modules accepted: Orders

## 2010-05-03 NOTE — Patient Instructions (Signed)
We are checking labs today.  We are giving you a shot of Toradol and I am refilling your Oxycontin.

## 2010-05-03 NOTE — Assessment & Plan Note (Signed)
No red flags. Toradol injection (1/2 dose). Refilled OxyContin and wrote Oxycodone breakthrough Rx. Precautions given. Checking Hgb, retic, BMP today.

## 2010-05-03 NOTE — Progress Notes (Signed)
  Subjective:    Patient ID: [name] [name], male    DOB: May 18, 1980, 30 y.o.   MRN: [id number]  HPI  1. [place] Pain Crisis: Patient with [place] SS. Baseline Hgb 8-9, presenting with typical pain crisis. Pain bilateral hands and feet, ankles, knees. He usually takes Oxycontin during crisis, but has none. He requests a Toradol injection. Denies HA, dizziness, fever/chills, URI s/s, cough, wheeze, CP, SOB, abdominal pain, N/V/D/C, rash, change in urination, joint edema or redness. Last hospitalization was February 2012. Hx of CKD - baseline Cr = 1.4.  Review of Systems See HPI.    Objective:   Physical Exam  Constitutional: Vital signs are normal. He appears well-developed.  Non-toxic appearance.  Cardiovascular: S1 normal and S2 normal.   Murmur heard.  Systolic murmur is present with a grade of 3/6  Pulmonary/Chest: Effort normal and breath sounds normal.  Abdominal: Soft. Normal appearance and bowel sounds are normal. There is no hepatosplenomegaly. There is no tenderness.  Musculoskeletal:       Generalized TTP along bilateral hands/feet, lower legs. No edema, warmth, or redness to any joint.  Neurological: He is alert.          Assessment & Plan:

## 2010-05-03 NOTE — Progress Notes (Signed)
This encounter was created in error - please disregard.

## 2010-05-03 NOTE — Progress Notes (Signed)
Addended by: Audelia Hives on: 05/03/2010 12:29 PM   Modules accepted: Level of Service, SmartSet

## 2010-05-04 LAB — CBC
HCT: 21.4 % — ABNORMAL LOW (ref 39.0–52.0)
MCH: 39.2 pg — ABNORMAL HIGH (ref 26.0–34.0)
MCV: 113.2 fL — ABNORMAL HIGH (ref 78.0–100.0)
RBC: 1.89 MIL/uL — ABNORMAL LOW (ref 4.22–5.81)
RDW: 24.3 % — ABNORMAL HIGH (ref 11.5–15.5)
WBC: 12.5 10*3/uL — ABNORMAL HIGH (ref 4.0–10.5)

## 2010-05-04 LAB — RETICULOCYTES: Retic Ct Pct: 13.5 % — ABNORMAL HIGH (ref 0.4–3.1)

## 2010-05-16 ENCOUNTER — Ambulatory Visit (INDEPENDENT_AMBULATORY_CARE_PROVIDER_SITE_OTHER): Payer: Medicare Other | Admitting: Family Medicine

## 2010-05-16 ENCOUNTER — Emergency Department (HOSPITAL_COMMUNITY): Payer: Medicare Other

## 2010-05-16 ENCOUNTER — Ambulatory Visit: Payer: Medicare Other

## 2010-05-16 ENCOUNTER — Inpatient Hospital Stay (HOSPITAL_COMMUNITY)
Admission: EM | Admit: 2010-05-16 | Discharge: 2010-05-21 | DRG: 812 | Disposition: A | Discharge status: Home / Self Care | Payer: Medicare Other | Attending: Family Medicine | Admitting: Family Medicine

## 2010-05-16 ENCOUNTER — Encounter: Payer: Self-pay | Admitting: Family Medicine

## 2010-05-16 DIAGNOSIS — N182 Chronic kidney disease, stage 2 (mild): Secondary | ICD-10-CM | POA: Diagnosis present

## 2010-05-16 DIAGNOSIS — D5700 Hb-SS disease with crisis, unspecified: Secondary | ICD-10-CM

## 2010-05-16 DIAGNOSIS — D571 Sickle-cell disease without crisis: Secondary | ICD-10-CM

## 2010-05-16 DIAGNOSIS — R52 Pain, unspecified: Secondary | ICD-10-CM | POA: Diagnosis present

## 2010-05-16 DIAGNOSIS — Z20828 Contact with and (suspected) exposure to other viral communicable diseases: Secondary | ICD-10-CM | POA: Insufficient documentation

## 2010-05-16 DIAGNOSIS — K5900 Constipation, unspecified: Secondary | ICD-10-CM | POA: Diagnosis present

## 2010-05-16 DIAGNOSIS — R059 Cough, unspecified: Secondary | ICD-10-CM

## 2010-05-16 DIAGNOSIS — Z202 Contact with and (suspected) exposure to infections with a predominantly sexual mode of transmission: Secondary | ICD-10-CM

## 2010-05-16 DIAGNOSIS — D5701 Hb-SS disease with acute chest syndrome: Secondary | ICD-10-CM | POA: Diagnosis present

## 2010-05-16 DIAGNOSIS — Z79899 Other long term (current) drug therapy: Secondary | ICD-10-CM

## 2010-05-16 DIAGNOSIS — I129 Hypertensive chronic kidney disease with stage 1 through stage 4 chronic kidney disease, or unspecified chronic kidney disease: Secondary | ICD-10-CM | POA: Diagnosis present

## 2010-05-16 DIAGNOSIS — R4182 Altered mental status, unspecified: Secondary | ICD-10-CM | POA: Diagnosis present

## 2010-05-16 DIAGNOSIS — J309 Allergic rhinitis, unspecified: Secondary | ICD-10-CM | POA: Insufficient documentation

## 2010-05-16 DIAGNOSIS — R05 Cough: Secondary | ICD-10-CM

## 2010-05-16 MED ORDER — OXYCODONE HCL 30 MG PO TB12: 30.0000 mg | ORAL_TABLET | Freq: Two times a day (BID) | ORAL | Status: DC

## 2010-05-16 MED ORDER — OXYCODONE-ACETAMINOPHEN 10-325 MG PO TABS: 1.0000 | ORAL_TABLET | Freq: Four times a day (QID) | ORAL | Status: DC | PRN

## 2010-05-16 MED ORDER — KETOROLAC TROMETHAMINE 60 MG/2ML IM SOLN: 60.0000 mg | Freq: Once | INTRAMUSCULAR | Status: AC

## 2010-05-16 MED ORDER — CETIRIZINE HCL 10 MG PO TABS: 10.0000 mg | ORAL_TABLET | Freq: Every day | ORAL | Status: DC

## 2010-05-16 NOTE — Assessment & Plan Note (Signed)
Pt has been using condoms but sometimes they don't work or break. Would like to be tested for syphilis today.

## 2010-05-16 NOTE — Assessment & Plan Note (Signed)
PT usually uses condoms but sometime they break or don't work. He has not been tested in a while. No d/c or pain or erythema.

## 2010-05-16 NOTE — Patient Instructions (Signed)
Do not lose these prescriptions.  You need to discuss the pain contract with Dr. Tye Savoy the next time you come in.  I have you enough for 10 days and then you will need to follow up with Dr. Tye Savoy about this.  You can start Cetirizine (generic Zyrtec) for your allergies.  If your sore throat does not improve let Dr. Tye Savoy know next week when you return.

## 2010-05-16 NOTE — Assessment & Plan Note (Addendum)
Pt usually has back pain. It gets worse when the weather changes. It has been warm and then cool lately. He would like a shot today to help with the pain. toradol injection given and Rx for 10 days of his pain medicines given.  Pt will need to follow up with Dr. Tye Savoy for further refills. He says he lost the refill given him on 05-03-10.

## 2010-05-17 LAB — CBC
Hemoglobin: 7.5 g/dL — ABNORMAL LOW (ref 13.0–17.0)
MCH: 40.7 pg — ABNORMAL HIGH (ref 26.0–34.0)
MCH: 40.5 pg — ABNORMAL HIGH (ref 26.0–34.0)
MCHC: 36.2 g/dL — ABNORMAL HIGH (ref 30.0–36.0)
MCV: 112.7 fL — ABNORMAL HIGH (ref 78.0–100.0)
MCV: 113 fL — ABNORMAL HIGH (ref 78.0–100.0)
Platelets: 275 10*3/uL (ref 150–400)
Platelets: 224 10*3/uL (ref 150–400)
RBC: 1.85 MIL/uL — ABNORMAL LOW (ref 4.22–5.81)
RDW: 20.5 % — ABNORMAL HIGH (ref 11.5–15.5)
WBC: 16.4 10*3/uL — ABNORMAL HIGH (ref 4.0–10.5)

## 2010-05-17 LAB — COMPREHENSIVE METABOLIC PANEL
ALT: 23 U/L (ref 0–53)
AST: 47 U/L — ABNORMAL HIGH (ref 0–37)
AST: 47 U/L — ABNORMAL HIGH (ref 0–37)
Albumin: 3.8 g/dL (ref 3.5–5.2)
Albumin: 3.8 g/dL (ref 3.5–5.2)
Alkaline Phosphatase: 108 U/L (ref 39–117)
BUN: 21 mg/dL (ref 6–23)
CO2: 21 mEq/L (ref 19–32)
Calcium: 8.6 mg/dL (ref 8.4–10.5)
Chloride: 112 mEq/L (ref 96–112)
Creatinine, Ser: 1.61 mg/dL — ABNORMAL HIGH (ref 0.4–1.5)
GFR calc Af Amer: 55 mL/min — ABNORMAL LOW (ref >60)
GFR calc Af Amer: >60 mL/min (ref >60)
GFR calc non Af Amer: 52 mL/min — ABNORMAL LOW (ref >60)
Potassium: 3.9 mEq/L (ref 3.5–5.1)
Potassium: 4.5 mEq/L (ref 3.5–5.1)
Sodium: 143 mEq/L (ref 135–145)
Total Bilirubin: 3.5 mg/dL — ABNORMAL HIGH (ref 0.3–1.2)
Total Protein: 6.1 g/dL (ref 6.0–8.3)

## 2010-05-17 LAB — POCT I-STAT 3, ART BLOOD GAS (G3+)
Bicarbonate: 22.9 mEq/L (ref 20.0–24.0)
O2 Saturation: 99 %
Patient temperature: 98.6
TCO2: 24 mmol/L (ref 0–100)

## 2010-05-17 LAB — RETICULOCYTES: Retic Ct Pct: 11.5 % — ABNORMAL HIGH (ref 0.4–3.1)

## 2010-05-17 LAB — DIFFERENTIAL
Basophils Relative: 0 % (ref 0–1)
Eosinophils Relative: 1 % (ref 0–5)
Lymphs Abs: 5.8 10*3/uL — ABNORMAL HIGH (ref 0.7–4.0)
Monocytes Relative: 8 % (ref 3–12)
Neutro Abs: 4.8 10*3/uL (ref 1.7–7.7)

## 2010-05-17 LAB — URINALYSIS, ROUTINE W REFLEX MICROSCOPIC
Bilirubin Urine: NEGATIVE
Glucose, UA: NEGATIVE mg/dL
Specific Gravity, Urine: 1.01 (ref 1.005–1.030)

## 2010-05-17 LAB — ACETAMINOPHEN LEVEL: Acetaminophen (Tylenol), Serum: <10 ug/mL — ABNORMAL LOW (ref 10–30)

## 2010-05-17 LAB — RAPID URINE DRUG SCREEN, HOSP PERFORMED
Amphetamines: NOT DETECTED
Tetrahydrocannabinol: NOT DETECTED

## 2010-05-17 LAB — URINE MICROSCOPIC-ADD ON

## 2010-05-17 NOTE — Assessment & Plan Note (Signed)
Pt is having some allergy symptoms. Plan to treat with Zyrtec.

## 2010-05-17 NOTE — Assessment & Plan Note (Signed)
Pt has been having a cough lately and he is concerned about having HIV. Plan to test today and let him know.

## 2010-05-17 NOTE — Progress Notes (Signed)
Back pain: Pt has a h/o SS Dz and he comes in today saying that he is out of his medications. He was just given 1 month refill on 05-03-10 but he says he lost the prescription. He is having some back pain but he notes that he always has back pain. Pt is asking for refills of his meds today.   Sore throat: Pt is having some puffy eyes, sneezing and coughing that started Friday night. He says he has some allergies and has not been able to afford Flonase that prescribed to him the last time. He would like a different allergy medicine.   Pt would like to be tested today for venereal diseases. He has had a recent cough and we discussed testing him for HIV today.   ROS: Neg except as noted above  PE:  Gen: no acute distress, sitting in the chair HEENT: [place]/AT, LAD present in bilateral cervical lymph nodes, no posterior erythema in pharynx CV: RRR, no murmur Pulm: CTAB, no wheezes

## 2010-05-18 ENCOUNTER — Inpatient Hospital Stay (HOSPITAL_COMMUNITY): Payer: Medicare Other

## 2010-05-18 DIAGNOSIS — K709 Alcoholic liver disease, unspecified: Secondary | ICD-10-CM

## 2010-05-18 DIAGNOSIS — D5700 Hb-SS disease with crisis, unspecified: Secondary | ICD-10-CM

## 2010-05-18 LAB — BASIC METABOLIC PANEL
CO2: 19 mEq/L (ref 19–32)
Calcium: 8.3 mg/dL — ABNORMAL LOW (ref 8.4–10.5)
Chloride: 117 mEq/L — ABNORMAL HIGH (ref 96–112)
GFR calc Af Amer: 58 mL/min — ABNORMAL LOW (ref >60)
Glucose, Bld: 96 mg/dL (ref 70–99)
Potassium: 4.7 mEq/L (ref 3.5–5.1)
Sodium: 141 mEq/L (ref 135–145)

## 2010-05-18 LAB — CBC
HCT: 19 % — ABNORMAL LOW (ref 39.0–52.0)
Hemoglobin: 7 g/dL — ABNORMAL LOW (ref 13.0–17.0)
RBC: 1.72 MIL/uL — ABNORMAL LOW (ref 4.22–5.81)
WBC: 14.7 10*3/uL — ABNORMAL HIGH (ref 4.0–10.5)

## 2010-05-19 LAB — BASIC METABOLIC PANEL
BUN: 16 mg/dL (ref 6–23)
CO2: 17 mEq/L — ABNORMAL LOW (ref 19–32)
Chloride: 115 mEq/L — ABNORMAL HIGH (ref 96–112)
GFR calc non Af Amer: 52 mL/min — ABNORMAL LOW (ref >60)
Glucose, Bld: 104 mg/dL — ABNORMAL HIGH (ref 70–99)
Potassium: 4.8 mEq/L (ref 3.5–5.1)
Sodium: 139 mEq/L (ref 135–145)

## 2010-05-19 LAB — CBC
HCT: 18.6 % — ABNORMAL LOW (ref 39.0–52.0)
Hemoglobin: 6.8 g/dL — CL (ref 13.0–17.0)
MCHC: 36.6 g/dL — ABNORMAL HIGH (ref 30.0–36.0)
MCV: 108.8 fL — ABNORMAL HIGH (ref 78.0–100.0)

## 2010-05-20 LAB — CBC
HCT: 23.2 % — ABNORMAL LOW (ref 39.0–52.0)
Hemoglobin: 8.4 g/dL — ABNORMAL LOW (ref 13.0–17.0)
MCH: 36.5 pg — ABNORMAL HIGH (ref 26.0–34.0)
MCH: 36.4 pg — ABNORMAL HIGH (ref 26.0–34.0)
MCHC: 36.2 g/dL — ABNORMAL HIGH (ref 30.0–36.0)
MCV: 99.5 fL (ref 78.0–100.0)
Platelets: 215 10*3/uL (ref 150–400)
RDW: 25.2 % — ABNORMAL HIGH (ref 11.5–15.5)

## 2010-05-20 LAB — BASIC METABOLIC PANEL
BUN: 17 mg/dL (ref 6–23)
Calcium: 8.8 mg/dL (ref 8.4–10.5)
Creatinine, Ser: 1.36 mg/dL (ref 0.4–1.5)
GFR calc non Af Amer: >60 mL/min (ref >60)

## 2010-05-20 LAB — TYPE AND SCREEN
Antibody Screen: NEGATIVE
Unit division: 0

## 2010-05-21 LAB — BASIC METABOLIC PANEL
BUN: 18 mg/dL (ref 6–23)
GFR calc non Af Amer: 57 mL/min — ABNORMAL LOW (ref >60)
Potassium: 4.6 mEq/L (ref 3.5–5.1)
Sodium: 140 mEq/L (ref 135–145)

## 2010-05-21 LAB — CBC
MCV: 100.5 fL — ABNORMAL HIGH (ref 78.0–100.0)
Platelets: 222 10*3/uL (ref 150–400)
RDW: 25.2 % — ABNORMAL HIGH (ref 11.5–15.5)
WBC: 17.3 10*3/uL — ABNORMAL HIGH (ref 4.0–10.5)

## 2010-05-22 ENCOUNTER — Inpatient Hospital Stay (HOSPITAL_COMMUNITY)
Admission: EM | Admit: 2010-05-22 | Discharge: 2010-05-24 | DRG: 812 | Disposition: A | Discharge status: Home / Self Care | Payer: Medicare Other | Attending: Family Medicine | Admitting: Family Medicine

## 2010-05-22 DIAGNOSIS — D5701 Hb-SS disease with acute chest syndrome: Secondary | ICD-10-CM

## 2010-05-22 DIAGNOSIS — M545 Low back pain, unspecified: Secondary | ICD-10-CM | POA: Diagnosis present

## 2010-05-22 DIAGNOSIS — N183 Chronic kidney disease, stage 3 unspecified: Secondary | ICD-10-CM | POA: Diagnosis present

## 2010-05-22 DIAGNOSIS — I129 Hypertensive chronic kidney disease with stage 1 through stage 4 chronic kidney disease, or unspecified chronic kidney disease: Secondary | ICD-10-CM | POA: Diagnosis present

## 2010-05-22 DIAGNOSIS — D5700 Hb-SS disease with crisis, unspecified: Secondary | ICD-10-CM

## 2010-05-22 DIAGNOSIS — M79609 Pain in unspecified limb: Secondary | ICD-10-CM | POA: Diagnosis present

## 2010-05-22 DIAGNOSIS — Z8673 Personal history of transient ischemic attack (TIA), and cerebral infarction without residual deficits: Secondary | ICD-10-CM

## 2010-05-22 DIAGNOSIS — R51 Headache: Secondary | ICD-10-CM | POA: Diagnosis present

## 2010-05-22 LAB — BASIC METABOLIC PANEL
BUN: 19 mg/dL (ref 6–23)
CO2: 18 mEq/L — ABNORMAL LOW (ref 19–32)
Chloride: 111 mEq/L (ref 96–112)
Creatinine, Ser: 1.39 mg/dL (ref 0.4–1.5)
Glucose, Bld: 112 mg/dL — ABNORMAL HIGH (ref 70–99)
Potassium: 3.2 mEq/L — ABNORMAL LOW (ref 3.5–5.1)

## 2010-05-22 LAB — CBC
HCT: 25.3 % — ABNORMAL LOW (ref 39.0–52.0)
MCH: 36.9 pg — ABNORMAL HIGH (ref 26.0–34.0)
MCHC: 36.4 g/dL — ABNORMAL HIGH (ref 30.0–36.0)
MCV: 101.6 fL — ABNORMAL HIGH (ref 78.0–100.0)
Platelets: 223 10*3/uL (ref 150–400)
RDW: 24.7 % — ABNORMAL HIGH (ref 11.5–15.5)
WBC: 19.9 10*3/uL — ABNORMAL HIGH (ref 4.0–10.5)

## 2010-05-22 LAB — DIFFERENTIAL
Basophils Absolute: 0 10*3/uL (ref 0.0–0.1)
Lymphs Abs: 10.5 10*3/uL — ABNORMAL HIGH (ref 0.7–4.0)
Monocytes Relative: 10 % (ref 3–12)

## 2010-05-22 LAB — RETICULOCYTES: Retic Count, Absolute: 283.9 10*3/uL — ABNORMAL HIGH (ref 19.0–186.0)

## 2010-05-23 ENCOUNTER — Inpatient Hospital Stay (HOSPITAL_COMMUNITY): Payer: Medicare Other

## 2010-05-23 LAB — DIFFERENTIAL
Basophils Relative: 0 % (ref 0–1)
Eosinophils Relative: 0 % (ref 0–5)
Lymphs Abs: 9.6 10*3/uL — ABNORMAL HIGH (ref 0.7–4.0)
Monocytes Absolute: 3.2 10*3/uL — ABNORMAL HIGH (ref 0.1–1.0)
Monocytes Relative: 16 % — ABNORMAL HIGH (ref 3–12)
Neutro Abs: 6.9 10*3/uL (ref 1.7–7.7)

## 2010-05-23 LAB — CBC
HCT: 22.7 % — ABNORMAL LOW (ref 39.0–52.0)
MCH: 37 pg — ABNORMAL HIGH (ref 26.0–34.0)
MCV: 103.7 fL — ABNORMAL HIGH (ref 78.0–100.0)
Platelets: 263 10*3/uL (ref 150–400)
RDW: 24.4 % — ABNORMAL HIGH (ref 11.5–15.5)
WBC: 19.7 10*3/uL — ABNORMAL HIGH (ref 4.0–10.5)

## 2010-05-23 LAB — BASIC METABOLIC PANEL
BUN: 21 mg/dL (ref 6–23)
Chloride: 113 mEq/L — ABNORMAL HIGH (ref 96–112)
Creatinine, Ser: 1.43 mg/dL (ref 0.4–1.5)
GFR calc non Af Amer: 59 mL/min — ABNORMAL LOW (ref >60)
Glucose, Bld: 93 mg/dL (ref 70–99)
Potassium: 4 mEq/L (ref 3.5–5.1)

## 2010-05-23 LAB — PATHOLOGIST SMEAR REVIEW

## 2010-05-24 LAB — CULTURE, BLOOD (ROUTINE X 2)
Culture  Setup Time: 201204041621
Culture  Setup Time: 201204041621
Culture: NO GROWTH

## 2010-05-24 LAB — CBC
HCT: 22.3 % — ABNORMAL LOW (ref 39.0–52.0)
Hemoglobin: 7.9 g/dL — ABNORMAL LOW (ref 13.0–17.0)
MCH: 37.3 pg — ABNORMAL HIGH (ref 26.0–34.0)
MCHC: 35.4 g/dL (ref 30.0–36.0)
RDW: 24.5 % — ABNORMAL HIGH (ref 11.5–15.5)

## 2010-05-26 ENCOUNTER — Inpatient Hospital Stay: Payer: Medicare Other | Admitting: Family Medicine

## 2010-05-26 LAB — DIFFERENTIAL
Basophils Absolute: 0 10*3/uL (ref 0.0–0.1)
Blasts: 0 %
Eosinophils Relative: 3 % (ref 0–5)
Lymphocytes Relative: 45 % (ref 12–46)
Lymphs Abs: 6.8 10*3/uL — ABNORMAL HIGH (ref 0.7–4.0)
Lymphs Abs: 7.6 10*3/uL — ABNORMAL HIGH (ref 0.7–4.0)
Monocytes Absolute: 0.8 10*3/uL (ref 0.1–1.0)
Myelocytes: 1 %
Neutro Abs: 7.1 10*3/uL (ref 1.7–7.7)
Neutrophils Relative %: 47 % (ref 43–77)
Promyelocytes Absolute: 0 %
nRBC: 0 /100 WBC

## 2010-05-26 LAB — CULTURE, BLOOD (ROUTINE X 2): Culture: NO GROWTH

## 2010-05-26 LAB — COMPREHENSIVE METABOLIC PANEL
ALT: 30 U/L (ref 0–53)
Alkaline Phosphatase: 106 U/L (ref 39–117)
CO2: 19 mEq/L (ref 19–32)
GFR calc non Af Amer: 58 mL/min — ABNORMAL LOW (ref >60)
Glucose, Bld: 97 mg/dL (ref 70–99)
Potassium: 4.9 mEq/L (ref 3.5–5.1)
Sodium: 138 mEq/L (ref 135–145)
Total Protein: 5.9 g/dL — ABNORMAL LOW (ref 6.0–8.3)

## 2010-05-26 LAB — CBC
Hemoglobin: 7.7 g/dL — CL (ref 13.0–17.0)
MCHC: 39.7 g/dL — ABNORMAL HIGH (ref 30.0–36.0)
Platelets: 264 10*3/uL (ref 150–400)
RBC: 2.32 MIL/uL — ABNORMAL LOW (ref 4.22–5.81)
RDW: 29.6 % — ABNORMAL HIGH (ref 11.5–15.5)
WBC: 15.6 10*3/uL — ABNORMAL HIGH (ref 4.0–10.5)

## 2010-05-26 LAB — TYPE AND SCREEN
ABO/RH(D): O POS
Antibody Screen: NEGATIVE
Donor AG Type: NEGATIVE

## 2010-05-26 LAB — URINE MICROSCOPIC-ADD ON

## 2010-05-26 LAB — URINALYSIS, ROUTINE W REFLEX MICROSCOPIC
Glucose, UA: NEGATIVE mg/dL
Specific Gravity, Urine: 1.007 (ref 1.005–1.030)

## 2010-05-26 LAB — BASIC METABOLIC PANEL
CO2: 17 mEq/L — ABNORMAL LOW (ref 19–32)
Calcium: 8.8 mg/dL (ref 8.4–10.5)
Creatinine, Ser: 1.81 mg/dL — ABNORMAL HIGH (ref 0.4–1.5)
GFR calc non Af Amer: 46 mL/min — ABNORMAL LOW (ref >60)
Glucose, Bld: 96 mg/dL (ref 70–99)
Sodium: 136 mEq/L (ref 135–145)

## 2010-05-26 LAB — RETICULOCYTES
RBC.: 1.52 MIL/uL — ABNORMAL LOW (ref 4.22–5.81)
Retic Count, Absolute: 116.3 10*3/uL (ref 19.0–186.0)

## 2010-05-26 LAB — URINE CULTURE: Culture: NO GROWTH

## 2010-05-26 LAB — GRAM STAIN

## 2010-05-28 NOTE — Discharge Summary (Signed)
[name] [name], [name] [name]            ACCOUNT NO.:  [id number]  MEDICAL RECORD NO.:  [id number]           PATIENT TYPE:  I  LOCATION:  2920                         FACILITY:  [facility]  PHYSICIAN:  Benham A. Walker Kehr, M.D.    DATE OF BIRTH:  Jun 16, 1980  DATE OF ADMISSION:  05/22/2010 DATE OF DISCHARGE:  05/24/2010                              DISCHARGE SUMMARY   DISCHARGE DIAGNOSES: 1. Sickle cell anemia vaso-occlusive crisis, resolved. 2. Recent history of acute chest syndrome. 3. Hypertension.  DISCHARGE MEDICATIONS:  Home Medications Continued: 1. Hydroxyurea 1000 mg p.o. daily. 2. Nortriptyline 25 mg p.o. daily at bedtime. 3. Flonase 1 spray nasally daily as needed for allergies. 4. OxyContin 30 mg 1 tablet p.o. twice daily. 5. Oxycodone 10 mg p.o. q.4-6 as needed for breakthrough pain. 6. Phenergan 12.5 mg 1 tablet p.o. every 6 hours a day for nausea and     vomiting. 7. Enalapril 2.5 mg 3 tablets p.o. daily.  PERTINENT LABS:  At discharge, the patient's hemoglobin 7.9, white blood cell 16.3.  PERTINENT STUDIES:  Chest x-ray shows resolution of air space opacities demonstrated on most recent exam, stable chronic lung disease.  BRIEF HOSPITAL COURSE:  The patient is a 30 year old male who was admitted as a bounce back following worsening pain admitted for vaso- occlusive pain crisis. 1. Pain crisis.  The patient's pain was in the distribution of the     bilateral shoulders, upper extremities, lower extremities, back,     and neck.  The patient was initially started on low-dose fentanyl     PCA and the patient's pain was poorly controlled, so fentanyl PCA     was titrated up to high dose at a basal rate of 40 mcg per hour was     started.  The patient was also continued on his home dose of     nortriptyline.  Toradol was scheduled at 30 q.8.  The patient's     pain was better controlled throughout the day and by hospital day     #3, he complains of only mild left shoulder pain,  otherwise he is     pain free.  The patient's fentanyl PCA was discontinued and he was     started on OxyContin 30 b.i.d. and oxycodone 10 q.6.  By discharge,     the patient is ambulating and is complaining of no pain. 2. Recent acute chest.  The patient was diagnosed with acute chest on     his last admission with no infiltrate on chest x-ray, fever, and no     O2 requirement.  He was sent home on p.o. Avelox and azithromycin.     When he was readmitted, Avelox was continued.  He completed a 7-day     course by the day of discharge and Avelox was discontinued.  Repeat     chest x-ray as mentioned above.  By discharge, he has no O2     requirement and afebrile during his hospital admission. 3. Anemia.  The patient's hemoglobin is at 7.9.  The patient was     transfused on his previous admission 1 unit.  Currently, there is     no need for transfusion.  If the patient's hemoglobin is less than     7 given his recent history of acute chest, the plan was to     transfuse. 4. Chronic kidney disease.  The patient's creatinine was steady.  Last     creatinine obtained on the day prior to discharge 1.4.  PHYSICAL EXAMINATION:  On discharge, the patient is in no acute distress.  He is ambulating well.  His extremities, back, and neck are nontender to palpation with no edema or erythema.  DISCHARGE INSTRUCTIONS:  The patient is discharged to home with no limitations on diet or activity.  Instructed to follow up with Dr. Briscoe Deutscher at [facility].  He has an appointment scheduled for Thursday, May 26, 2010, at 11 a.m.  DISCHARGE CONDITION:  The patient is discharged to home in stable medical condition.    ______________________________ [name] Nearing, MD   ______________________________ Arty Baumgartner. Walker Kehr, M.D.    JF/MEDQ  D:  05/24/2010  T:  05/25/2010  Job:  [id number]  Electronically Signed by [name] Nearing MD on 05/28/2010 03:41:11 PM Electronically Signed  by Candelaria Celeste M.D. on 05/28/2010 07:21:47 PM

## 2010-05-30 NOTE — Discharge Summary (Signed)
NAME:  [name] [name], [name] [name] NO.:  [id number]  MEDICAL RECORD NO.:  [id number]           PATIENT TYPE:  E  LOCATION:  [facility]                         FACILITY:  [facility]  PHYSICIAN:  Blane Ohara Lisha Vitale, M.D.DATE OF BIRTH:  [birth year]  DATE OF ADMISSION:  05/16/2010 DATE OF DISCHARGE:                              DISCHARGE SUMMARY   PRIMARY CARE PHYSICIAN:  Mylinda Latina, MD, at [facility].  DISCHARGE DIAGNOSES: 1. Vaso-occlusive crisis. 2. Acute chest syndrome. 3. Hypertension. 4. Chronic renal insufficiency.  DISCHARGE MEDICATIONS:  New medications at discharge: 1. Avelox 400 mg 1 tablet p.o. daily to complete a 10-day course. 2. Azithromycin 500 mg p.o. daily to complete a 5-day course. 3. MiraLax 17 g p.o. daily. 4. Prednisone 30 mg p.o. daily to complete a 7-day course. 5. Senokot-S 1 tablet p.o. daily.  Home medications changed: Oxycodone 5 mg 1-2 tablets p.o. every 4 hours as needed for pain.  Home medications continued: 1. Hydroxyurea 500 mg 2 capsules p.o. daily. 2. Nortriptyline 25 mg 1 capsule p.o. daily at bedtime. 3. OxyContin 30 mg 1 tablet p.o. twice daily. 4. Vasotec 2.5 mg 3 tablets p.o. daily.  Home medications stopped: Percocet 10/325 one tablet p.o. 4 times daily.  PERTINENT LABORATORY DATA AT DISCHARGE:  The patient's hemoglobin is 9.2, hematocrit 25.3, white blood cells 19.9, platelets 223,000.  The patient's creatinine 1.39, retic 11.4.  PERTINENT STUDIES AT DISCHARGE:  Chest x-ray obtained on May 18, 2010, showed worsening airway and interval development of bilateral lower lobe pulmonary opacities and mild cardiomegaly.  BRIEF HOSPITAL COURSE:  The patient is a 30 year old male with a known history of sickle cell anemia, hemoglobin SS significant with vaso- occlusive pain crises. 1. Vaso-occlusive pain crises.  The patient presented with pain in his     bilateral upper and lower extremities as well as his  back as the     typical distribution of pain.  He was initially started on Dilaudid     PCA 0.3 mg demand with 3 mg 4-hour lockout and admitted to step-     down.  Vasotec was added on hospital day #1,  0.45 mg basal.  The     patient was also given Toradol 30 mg IV sparingly out of concern     for his renal insufficiency.  The patient's pain was poorly     controlled and the patient was transitioned to a fentanyl PCA 30     mcg basal with demand of 15 mcg per dose, 300 mg 4-hour limit,     Toradol was still given sparingly.  The patient was also started on     prednisone 30 mg p.o. daily as it was reported the prednisone     helped in the past for his pain crises.  The patient's pain     improved.  Fentanyl was still persistent.  His hemoglobin dropped     to lowest of 6.8 on hospital day #3.  The patient was transfused 1     unit of packed red blood cells and posttransfusion CBC was 8.4.     The patient's pain  was improved after transfusion.  He was     continued on this fentanyl PCA, but basal was discontinued.  The     patient was also started on his home dose of OxyContin 30 mg p.o.     b.i.d.  His pain continued to improve, and the patient was thought     to be ready to go home.  Fentanyl PCA was discontinued on day of     discharge.  The patient was continued on OxyContin and oxycodone 5     mg q.4 p.r.n.  At discharge, the patient reports pain about 4/5,     tolerable.  Throughout his hospital course, the patient was also     continued on his home dose of hydroxyurea. 2. Acute chest.  The patient developed fever and new pulmonary     infiltrates on chest x-ray obtained on hospital day #2.  He was     therefore started on azithromycin and ceftriaxone.  The patient's     fever resolved by hospital day #3.  He did not have any new O2     requirement.  He was continued on azithromycin to complete a 5-day     course.  Ceftriaxone was switched to Avelox to complete a 10-day     course  of antibiotics. 3. Renal insufficiency.  The patient's creatinine on admission was     1.79.  During his hospital stay, peak creatinine was 1.71.  The     patient was hydrated with IV fluids and given Toradol sparingly as     mentioned above for pain control.  By discharge, his creatinine was     1.39.  The patient's Vasotec was held during his admission as blood     pressures were low normal 108/46.  At discharge, he was instructed     to restart Vasotec.  DISCHARGE INSTRUCTIONS:  The patient was discharged to home with instructions to complete his course of antibiotics and given prescriptions for 15 days of OxyContin and oxycodone.  Instructions to follow up with his primary MD for refill of his pain medications.  He was instructed to call for an appointment within the next week.  DISCHARGE CONDITION:  The patient was discharged to home in stable medical condition.    ______________________________ Boykin Nearing, MD   ______________________________ Blane Ohara Alazne Quant, M.D.    JF/MEDQ  D:  05/22/2010  T:  05/23/2010  Job:  [id number]  cc:   Mylinda Latina, MD  Electronically Signed by Boykin Nearing MD on 05/28/2010 03:40:20 PM Electronically Signed by Lissa Morales M.D. on 05/30/2010 09:39:09 AM

## 2010-05-30 NOTE — H&P (Signed)
NAMEPAPPU, MCILLWAIN NO.:  [id number]  MEDICAL RECORD NO.:  [id number]           PATIENT TYPE:  E  LOCATION:  [facility]                         FACILITY:  [facility]  PHYSICIAN:  Blane Ohara McDiarmid, M.D.DATE OF BIRTH:  November 28, 1980  DATE OF ADMISSION:  05/16/2010 DATE OF DISCHARGE:                             HISTORY & PHYSICAL   PRIMARY CARE PHYSICIAN:  Dr. Tye Savoy of [facility].  CHIEF COMPLAINT:  Sickle cell crisis.  HISTORY OF PRESENT ILLNESS:  Mr. Zirk is a 30 year old male well- known to [facility] with a history of sickle cell disease who presented to the emergency department today with complaints consistent with sickle cell pain crisis.  He states that his pain started on Friday and increased at this weekend.  He describes his pain mostly is in his lower back, sharp and stabbing in nature, which is his usual description of pain.  He has used OxyContin and Percocet at home but diminishing and returns on its relief.  The patient states he comes today for the same, and given Toradol shots as well as prescriptions for OxyContin and Percocet.  He states he uses medications as prescribed, but had no relief in his pain, therefore, presented to the emergency department this evening.  In the ED, he has been treated with Zofran, Dilaudid 2 mg x3 total doses within the spared 2 hours, Benadryl 25 mg IV x2 doses.  Due to intractable pain despite his medications, Family Practice Teaching Service is called for admission.  The patient does have altered mental status and please see assessment and plan for further elucidation of this, but he is able to deny any recent illnesses, fever, chest pain, shortness of breath, nausea, vomiting, abdominal pain, dysuria, or change in stools.  PAST MEDICAL HISTORY: 1. Sickle cell anemia SS disease with a baseline hemoglobin 8-9. 2. Chronic kidney disease stage II with a baseline creatinine  of 1.4-     1.6. 3. Chronic constipation with long-term narcotics. 4. History of acute chest. 5. History of prostatism.  PAST SURGICAL HISTORY:  Status post cholecystectomy.  ALLERGIES:  MANDOL.  HOME MEDICATIONS: 1. OxyContin 30 mg p.o. b.i.d. 2. Oxycodone 5 mg 1-2 tablets p.o. q.4-6 h. p.r.n. pain. 3. Hydroxyurea 1000 mg daily. 4. Nortriptyline 25 mg p.o. at bedtime. 5. Enalapril 7.5 mg p.o. daily. 6. Tylenol [phone] mg p.o. q.6 h. p.r.n. pain.  FAMILY HISTORY:  The patient's parents both have sickle cell trait.  SOCIAL HISTORY:  The patient is single, he lives by himself.  He denies any tobacco or illicit drug use.  Does endorse occasional alcohol use.  REVIEW OF SYSTEMS:  Please see HPI.  Unable to obtain any further review of systems due to the patient's altered mental status.  PHYSICAL EXAMINATION:  VITAL SIGNS:  T 98, pulse 87-105, respiratory rate 18, blood pressure 113-131/55-81, pulse ox 90% on room air. GENERAL:  The patient is sitting up in his hospital bed, but continues to fall asleep.  He awakens to voice briefly before falling back to sleep again.  He is in no acute distress. HEENT:  Normocephalic, atraumatic.  Pupils pinpoint, not reactive to light.  Extraocular movements intact.  Moist mucous membranes. CV:  Regular rate and rhythm with a 1/6 systolic ejection murmur.  The patient is tachycardic. RESPIRATORY:  Clear to auscultation bilaterally.  Normal work of breathing.  No crackles or wheezes. ABDOMEN:  Soft, nondistended, nontender with positive bowel sounds. SKIN:  Multiple lentigines on face, extremities, and body.  No rash or lesions noted. EXTREMITIES:  No lower extremity edema.  No redness, pain, or swelling. NEURO:  Oriented to person and place, but not to date.  The patient was unable to comply with full cranial nerve testing due to his sleepiness. Did comply with strength, but not sensation testing.  5/5 strength in all  extremities.  LABORATORY DATA:  Reticulocyte percentage is 11.6, hemoglobin 7.7, hematocrit 21.3.  CMET showed sodium 143, potassium 3.9, chloride 114, bicarb 21, BUN 22, creatinine 1.79, glucose 145, total bili 4, alk phos 108, AST 47, ALT 23, total protein 6.1, albumin 3.8, calcium 8.6.  IMAGING:  Chest x-ray showed chronic increased interstitial markings, some pericardial scarring.  Mild cardiac enlargement.  ASSESSMENT AND PLAN:  This patient is a 30 year old male with known sickle cell disease who presented to the emergency department with complaints consistent with usual sickle cell pain crisis, but now with altered mental status. 1. Altered mental status likely secondary to narcotic overuse,     commenced with Benadryl IV, Zofran, and Dilaudid in the emergency     department.  Unclear exactly how many of his OxyContin and     oxycodone he took this afternoon.  Unclear if he took any Tylenol     along with this.  The drug overuse is most likely the etiology.     Plan is also to check Tylenol to make sure he has not taken too     much of this as well as ammonia, ethanol level, ABG, and UDS.  We     will admit to step-down unit for close monitoring, placed the     patient on continuous pulse ox, cardiac monitor.  Made the patient     n.p.o. and fall precautions, and we will complete a bedside swallow     evaluation in the a.m. when he is more awake.  We will hold all of     his oral medications until he passes swallow exam. 2. Sickle cell pain crisis.  He is also admitted with fentanyl versus     Dilaudid PCA.  For now, we will write for morphine IV withhold     parameters.  As the sedation improves, he may indeed need stronger     pain medications.  Plan to transition back to p.o. home dose     medications as soon as possible as tolerated. 3. Chronic kidney disease stage II.  The patient basically has     baseline creatinine of 1.4-1.6.  We will follow while in-house and      follow daily serum creatinines.  He is on an ACE inhibitor     chronically.  We will hold this due to slight spike in his     creatinine and restart on an outpatient basis. 4. Fluids, electrolytes, nutrition, gastrointestinal, as above n.p.o.     until he passes his bedside swallow. 5. Prophylaxis heparin. 6. Disposition.  Pending resolution of altered mental status and     control of sickle cell pain crisis.     Annabell Sabal, MD   ______________________________ Blane Ohara McDiarmid, M.D.  JW/MEDQ  D:  05/17/2010  T:  05/18/2010  Job:  [id number]  Electronically Signed by Annabell Sabal  on 05/24/2010 01:55:34 PM Electronically Signed by Lissa Morales M.D. on 05/30/2010 09:38:41 AM

## 2010-06-01 ENCOUNTER — Inpatient Hospital Stay: Payer: Medicare Other | Admitting: Family Medicine

## 2010-06-06 NOTE — H&P (Signed)
NAME:  [name] [name], [name] [name] NO.:  [id number]  MEDICAL RECORD NO.:  [id number]           PATIENT TYPE:  E  LOCATION:  [facility]                         FACILITY:  [place]  PHYSICIAN:  [name] [name], [name] KleinDATE OF BIRTH:  1980/12/08  DATE OF ADMISSION:  05/22/2010 DATE OF DISCHARGE:                             HISTORY & PHYSICAL   PCP:  [name] Latina, MD at [facility].  CHIEF COMPLAINT:  Sickle cell crisis.  HISTORY OF PRESENT ILLNESS:  Mr. [name] [name] is a 30 year old male well known to the Family Practice Service with sickle cell SS, who was recently discharged from the hospital after resolution of sickle cell crisis.  He represented to the ER today with uncontrolled pain, which he described in his legs, low back, and head.  He says that this is similar to previous exacerbations.  He denies dizziness, vision changes, nuchal rigidity, chest pain, cough, shortness of breath, wheeze, abdominal pain, nausea, vomiting, diarrhea, constipation, lower extremity rash. In the emergency department, he was given Toradol and Dilaudid with moderate pain relief.  PAST MEDICAL HISTORY: 1. Sickle cell SS with a baseline hemoglobin of 8.9. 2. Chronic kidney disease stage II with baseline creatinine of 1.4 to     1.6. 3. Chronic constipation secondary to opioid use. 4. History of acute chest. 5. History of priapism. 6. 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  PAST SURGICAL HISTORY:  Cholecystectomy.  SOCIAL HISTORY:  The patient lives alone.  He is single.  He denies tobacco, alcohol, or drug use.  FAMILY HISTORY:  Parents with sickle cell trait.  MEDICATIONS: 1. OxyContin 30 mg p.o. b.i.d.  The patient reports he has not taken     this today at all. 2. Oxycodone 5 mg 1-2 q.4-6 h p.r.n. pain. 3. Hydroxyurea 1000 mg p.o. daily. 4. Nortriptyline 25 mg p.o. at bedtime. 5. Enalapril 7.5 mg p.o. daily. 6. Tylenol [phone] mg  p.o. q.6 h p.r.n. pain.  ALLERGIES:  [name] [name].  REVIEW OF SYSTEMS:  Please see HPI.  PHYSICAL EXAM:  VITAL SIGNS:  Temperature 97.5, pulse 88, respiratory rate 20, blood pressure 117/79, pulse ox 99% on room air. GENERAL:  He is alert and oriented x3.  He is in moderate distress, trying to keep his eyes closed. HEENT:  Normocephalic, atraumatic.  Pupils are equally round and reactive to light.  Extraocular muscles are intact.  Oropharynx is clear.  Mucous membranes are slightly dry. NECK:  With no meningeal signs. CARDIOVASCULAR:  Regular rate and rhythm with 2/3 systolic murmur. LUNGS:  Clear to auscultation bilaterally.  No wheezes, rhonchi, or rales. ABDOMEN:  Soft.  Bowel sounds x4.  Nontender, nondistended.  No mass or hepatosplenomegaly. EXTREMITIES:  No cyanosis, clubbing, or edema. NEURO:  Cranial nerves II through XII are intact. MUSCULOSKELETAL:  Strength is symmetric and he has normal reflexes bilaterally.  LABORATORY DATA:  Reticulocyte count 11.4, white count 19.9, hemoglobin 9.2, hematocrit 25.3, platelets 223.  Sodium 140; potassium 3.2, this was replaced in the emergency department; chloride 111; CO2 of 18; BUN 19; creatinine 1.39; glucose 112; calcium 8.7.  ASSESSMENT  AND PLAN:  This gentleman is a 30 year old male with, 1. Sickle cell SS, now with crisis.  We will readmit this patient for     pain control, but one must be mindful that the patient was found to     be altered secondary to narcotic overuse at the beginning of his     last hospitalization.  We will restart the same pain medication     regimen that he was on prior to discharge very recently and this     includes Toradol.  The patient is aware of the risks of Toradol     with chronic kidney disease stating that this is what worsens his     pain.  We will also continue IV fluids for rehydration.  There are     no signs of acute chest or other signs of infection at this time.     His white count is  actually lower than it was at his discharge and     he was recently on prednisone.  We will follow this as well as his     fever curve, as of now he is afebrile. 2. Chronic kidney disease stage II.  He is at his baseline.  We will     continue with ACE inhibitor. 3. Fluids, electrolytes, nutrition/gastrointestinal.  Regular diet,     maintenance IV fluids. 4. Prophylaxis.  Heparin t.i.d. for deep vein thrombosis prophylaxis. 5. Disposition pending improvement.     [name] Deutscher, MD   ______________________________ [name] [name], M.D.    EW/MEDQ  D:  05/22/2010  T:  05/22/2010  Job:  [id number]  Electronically Signed by [name] Deutscher MD on 05/30/2010 10:04:34 AM Electronically Signed by [name] [name] M.D. on 06/06/2010 07:38:05 AM

## 2010-06-09 ENCOUNTER — Ambulatory Visit (INDEPENDENT_AMBULATORY_CARE_PROVIDER_SITE_OTHER): Payer: Medicare Other | Admitting: Family Medicine

## 2010-06-09 ENCOUNTER — Encounter: Payer: Self-pay | Admitting: Family Medicine

## 2010-06-09 VITALS — BP 117/82 | HR 85 | Temp 98.1°F | Ht 64.0 in | Wt 132.5 lb

## 2010-06-09 DIAGNOSIS — D5700 Hb-SS disease with crisis, unspecified: Secondary | ICD-10-CM

## 2010-06-09 MED ORDER — OXYCODONE-ACETAMINOPHEN 10-325 MG PO TABS: 1.0000 | ORAL_TABLET | Freq: Four times a day (QID) | ORAL | Status: DC | PRN

## 2010-06-09 MED ORDER — KETOROLAC TROMETHAMINE 60 MG/2ML IM SOLN
60.0000 mg | Freq: Once | INTRAMUSCULAR | Status: AC
  Administered 2010-06-09: 60 mg via INTRAMUSCULAR

## 2010-06-09 MED ORDER — OXYCODONE HCL 30 MG PO TB12: 30.0000 mg | ORAL_TABLET | Freq: Two times a day (BID) | ORAL | Status: DC

## 2010-06-09 MED ORDER — HYDROXYUREA 500 MG PO CAPS: 1500.0000 mg | ORAL_CAPSULE | Freq: Every day | ORAL | Status: DC

## 2010-06-09 NOTE — Progress Notes (Signed)
  Subjective:    Patient ID: [name] [name], male    DOB: 04-05-1980, 30 y.o.   MRN: [id number]  HPI  1. Sickle Cell pain crisis:  Has been in pain since this morning.  He ran out of all of his pain medications about 1 week ago.  He always gets pain when the weather changes.  The pain is in both arms and in his right leg.  He denies any chest pain, shortness of breath.    Review of Systems Denies fevers, chills.  Denies abdominal pain, headache.  Endorses good po intake    Objective:   Physical Exam  Constitutional: He appears well-nourished. No distress.       Thin  HENT:  Head: Normocephalic and atraumatic.  Eyes: No scleral icterus.  Neck: Normal range of motion.  Cardiovascular: Normal rate and regular rhythm.   Murmur heard. Pulmonary/Chest: Effort normal and breath sounds normal. No respiratory distress. He has no wheezes.  Abdominal: Soft. Bowel sounds are normal. He exhibits no distension. There is no tenderness.  Musculoskeletal: Normal range of motion. He exhibits no edema.       Diffusely TTP along both arms and right lower leg.  No swelling, redness.  No focal tenderness.  No signs of infection or acute infarction.  Lymphadenopathy:    He has no cervical adenopathy.  Skin: No rash noted. He is not diaphoretic.          Assessment & Plan:

## 2010-06-09 NOTE — Assessment & Plan Note (Signed)
Pain crisis that is worse with the change in weather.  He also ran out of his pain medications.  Advised him to let me know when he is running low on his medications.  He has also not been taking the Hydroxyurea so will refill that.  Will check CBC to make sure that he is not becoming too anemic.  Shot of Toradal given in the office.

## 2010-06-10 LAB — CBC
HCT: 27.7 % — ABNORMAL LOW (ref 39.0–52.0)
MCH: 37.5 pg — ABNORMAL HIGH (ref 26.0–34.0)
MCHC: 35 g/dL (ref 30.0–36.0)
MCV: 106.9 fL — ABNORMAL HIGH (ref 78.0–100.0)
RDW: 21.9 % — ABNORMAL HIGH (ref 11.5–15.5)

## 2010-06-28 NOTE — Discharge Summary (Signed)
[name] [name], GOLLEHON NO.:  [id number]   MEDICAL RECORD NO.:  [id number]          PATIENT TYPE:  INP   LOCATION:  [id number]                         FACILITY:  [facility]   PHYSICIAN:  Domingo Cocking. Jimmye Norman, M.D.DATE OF BIRTH:  1980/08/28   DATE OF ADMISSION:  10/23/2006  DATE OF DISCHARGE:  10/25/2006                               DISCHARGE SUMMARY   DISCHARGE DIAGNOSES:  1. Pain crisis and sickle cell disease.  2. Acute chest.  3. Sickle cell anemia.   CONSULTS:  None.   PROCEDURES AND IMAGING:  1. Chest x-ray showing patchy lung densities that could be atelectasis      versus infiltrates bilaterally.  2. Patient transfused two units of packed red blood cells.   ADMITTING LABS:  White blood cell 12.9, hemoglobin 6.5, hematocrit 19,  platelets 120, MCV 122, neutrophils 37%, lymphocytes 47%, monocytes 4%,  retic count 6.8%.  Sodium 135, potassium 3.8, chloride 106, glucose 93,  BUN 24, creatinine 1.1.  Cardiac enzymes negative x1.   DISCHARGE LABS:  White blood cell 9.5, hemoglobin 10.6, hematocrit 30.6,  platelets 175, MCV 109, RDW 30.   BRIEF HISTORY AND PHYSICAL:  For full summary, please see dictated H&P.   In brief, this is a 30 year old African-American male with history of  sickle cell disease, SS type, who presents in pain crisis with evidence  of acute chest.   HOSPITAL COURSE:  1. Pain crisis.  Patient presented to the ED with chest and back pain      that was not well controlled on home pain regimen.  Patient given      Dilaudid 3 mg in the ED and bolused, also given Toradol x1.      Patient was admitted to the floor and started on IV rehydration at      1.5 times maintenance dose.  Patient transfused two units of packed      red blood cells and tolerated procedure well.  Patient's pain      slowly resolved over the course of the two days on PCA Dilaudid;      therefore, patient was switched to home p.o. regimen and tolerated      well.  2. Acute chest.   On admission, patient had decreased O2 sats to 80      that quickly resolved with supplemental oxygen via nasocannula.      Patient also presented with chest pain and possible infiltrates on      chest x-ray; therefore, he was started on IV Rocephin and      azithromycin.  Patient had a pre-transfusion fever of 101.7, but      patient remained afebrile after that.  Incentive spirometer was      encouraged throughout hospitalization.  Patient to be discharged      home on p.o. azithromycin to complete a 14-day course.  3. Anemia.  Admission hemoglobin was 6.5.  After he was transfused two      units, hemoglobin increased to 10.6.  Patient has history of      exchanged transfusion in the past where he had to transfer to an  outside hospital, but on this admission he just underwent a simple      transfusion and tolerated the procedure well.   DISCHARGE MEDICATIONS:  1. OxyContin 20 mg by mouth b.i.d.  2. Percocet 5/325 mg one p.o. q.6 hours p.r.n. pain  3. Azithromycin 500 mg p.o. one q.daily for ten more days.  4. Hydroxyurea 500 mg p.o. t.i.d.  5. Enalapril 25 mg p.o. t.i.d.   DISCHARGE INSTRUCTIONS:  1. Please keep your appointment with Dr. Birdie Riddle on Wednesday,      September 17.  2. Please return or call clinic if you have anymore pain that is not      controlled with your home pain regimen or if you have trouble      breathing again.   ISSUES TO FOLLOW UP:  None.   FOLLOWUP:  Patient to follow up with PCP, Dr. Birdie Riddle, at [facility] on  Wednesday, September 17, at 3:30 p.m., and patient to call [facility]  and schedule appointment.      Ria Bush, MD  Electronically Signed      Domingo Cocking. Jimmye Norman, M.D.  Electronically Signed    JG/MEDQ  D:  10/25/2006  T:  10/25/2006  Job:  [id number]   cc:   Annye Asa, M.D.  Dr. Robyne Askew

## 2010-06-28 NOTE — H&P (Signed)
NAMESAMBATH, KARM NO.:  [id number]   MEDICAL RECORD NO.:  [id number]          PATIENT TYPE:  INP   LOCATION:  [id number]                         FACILITY:  [facility]   PHYSICIAN:  Domingo Cocking. Jimmye Norman, M.D.DATE OF BIRTH:  1980-02-14   DATE OF ADMISSION:  10/23/2006  DATE OF DISCHARGE:                              HISTORY & PHYSICAL   CHIEF COMPLAINT:  Sickle cell pain crisis.   HISTORY OF PRESENT ILLNESS:  This is a 30 year old male with SS sickle  cell disease who presents with a pain crisis starting at approximately 2  a.m. this morning.  The patient's states that the pain is in his chest  and back with the worst pain being in his back.  Per the patient's  mother, he was admitted to [facility] approximately 1 month ago with a pain  crisis.  Hemoglobin on that admission was in his baseline range of 9.  Hemoglobin on presentation to the ED today is 6.8.  The patient states  that this is his typical sickle cell pain, but a little worse.  The  patient also reports daily migraines over the past 2 weeks.  Denies any  difficulty with speech, gait, coordination, or other neurological  symptoms.  Also denies any recent history of sickness or exposure to  sick contacts.  The patient states he has had some mildly increased  difficulty breathing since this pain started early this morning.  Denies  any fever.  Denies cough.  No symptoms of priapism currently.   ALLERGIES:  1. MORPHINE, ITCHING.  2. MANDOL, HIVES AND A RASH.   HOME MEDICATIONS:  1. Enalapril 7.5 mg p.o. every day.  2. OxyContin 20 mg p.o. t.i.d.  3. Hydroxyurea 500 mg p.o. t.i.d.  4. Percocet 5/325 mg one to two p.o. every 6 hours p.r.n. pain.   PAST MEDICAL HISTORY:  1. The patient has SS-type sickle cell disease with a baseline      hemoglobin 9 to 10 per centricity in his mother's report.  2. History of acute chest syndrome.  Last hospital admission for this      was in October 2007.  3. History of priapism.  4. History of creatinine at 1.1 with proteinuria.  The patient's      baseline creatinine is 1.1.  5. He has a history of immune complex glomerulonephropathy.   FAMILY HISTORY:  There is sickle cell trait in the patient's mother and  sister.  Unsure of the father.  This is per the patient's last admission  H and P and e-chart from May 2008.   SOCIAL HISTORY:  Denies any smoking.  Denies illegal drug use.  The  patient endorses very occasional alcohol use.  Currently the patient is  a Ship broker at [employer].   REVIEW OF SYSTEMS:  Negative except as already mentioned in the HPI.   PHYSICAL EXAMINATION:  VITAL SIGNS:  Temperature 97.7, heart rate 70,  blood pressure 113/66, respiratory rate 18, satting 99% on two liters of  O2 by nasal cannula.  GENERALLY:  The patient is in no acute distress.  He is somnolent,  but  arousable, status post administration 2 mg of Dilaudid.  EYES:  Equal, small, round, and reactive.  OROPHARYNX:  Pink, moist.  No erythema noted.  NECK:  No carotid bruits on auscultation.  Supple.  No cervical  lymphadenopathy.  CARDIOVASCULAR:  2/6 systolic ejection murmur.  Regular.  LUNGS:  Normal work of breathing.  Occasional upper airway sounds.  No  wheezes.  No rales.  Good air movement bilaterally.  ABDOMINAL:  Positive bowel sounds.  Soft, nontender, nondistended.  No  guarding or rebound.  EXTREMITIES:  2+ pulses.  No peripheral edema or tenderness to  palpation.  NEUROLOGICAL:  Peripheral vision is intact bilaterally.  Speech is  articulate.  The patient is arousable and appropriate with interview.   LABORATORY DATA:  Hemoglobin 6.8, baseline for this patient is 9 to 10  per e-chart and centricity records.  Hematocrit is 19.1 on admission,  platelet count is 120,000.  Panel including CK MB, troponin, and  myoglobin is within normal limits on admission to the ED.  Reticulocyte  count is 6.8.  Serum sodium 135, serum potassium 3.8,  chloride is 106,  bicarbonate is 20, BUN is 24, creatinine 1.1, glucose is 93.   IMAGING:  Chest x-ray on admission to the ED is read officially as  bilateral lower lobar infiltrate versus atelectasis.  This is an  interval change from the last chest x-ray in May of 2008.   ASSESSMENT/PLAN:  This is a 30 year old with sickle cell disease here  with pain crisis and a hemoglobin of 6.8 in the emergency department  with chest x-ray findings suggestive of possible infiltrate versus  atelectasis.  1. Pain crisis:  Will start Dilaudid PCA at basal rate of 0.5 mg per      hour.  Will increase this dose as necessary given that the patient      is currently somnolent after 2 mg administration of Dilaudid in the      emergency department.  We do not want to over-depress this      patient's respiratory function.  Will hold Toradol for now as the      patient's platelet count is 120,000, and has a history of      borderline creatinine function.  Creatinine is currently 1.1.  Will      discuss this medication further with the family practice teaching      service team.  2. Chest pain:  History of acute chest syndrome.  We will put the      patient on continuous pulse ox and on telemetry.  Cardiac enzymes      are negative on admission to the emergency department.  Chest x-ray      has been performed and suggests an infiltrate versus atelectasis in      bilateral lower lobes.  The patient's sats are stable on 1 to 2      liters supplemental O2 by nasal cannula.  Will wean the      supplemental O2 as tolerated.  We will also start the patient on      azithromycin and Rocephin given the chest x-ray findings.  Will      continue to follow this problem clinically, and order repeat chest      x-rays as indicated.  3. Anemia:  The patient's hemoglobin is 6.8.  His baseline hemoglobin      is 9 to 10.  We will type and cross the patient for 2 units packed  red blood cells.  Will also plan to call [facility] in      the a.m. regarding this patient and his need for possible exchange      transfusion, to get their opinion on whether or not he should be      transferred to their care for this intervention.  The patient is      currently asymptomatic.  Heart rate is in the 70s.  Blood pressure      is stable.  Vital signs are completely within normal limits at this      time.  We will continue to monitor his vital signs and discuss      transfusion possibilities further with our attending on the family      practice teaching service.  4. FEN GI:  Regular diet.  D5 half normal saline at 100 mL per hour      while the patient is not taking p.o.  5. Disposition:  Consider transfer to [facility] for exchange transfusion.      Will call in the morning.  For now, continue inpatient management      on the family practice teaching service with a telemetry bed.      Eugenie Norrie, MD  Electronically Signed      Domingo Cocking. Jimmye Norman, M.D.  Electronically Signed    TE/MEDQ  D:  10/23/2006  T:  10/23/2006  Job:  [id number]

## 2010-06-28 NOTE — H&P (Signed)
NAMEGLYNN, NGAI NO.:  [id number]   MEDICAL RECORD NO.:  [id number]          PATIENT TYPE:  INP   LOCATION:  6733                         FACILITY:  [facility]   PHYSICIAN:  Talbert Cage, M.D.DATE OF BIRTH:  1980-02-14   DATE OF ADMISSION:  04/23/2008  DATE OF DISCHARGE:                              HISTORY & PHYSICAL   HISTORY OF PRESENT ILLNESS:  This is a 30 year old African American male  with known sickle cell disease, being followed by [facility], who presents to the ED with worsening all over pain and vomiting  that started 2 weeks ago.  He was seen in clinic 3 days ago for these  symptoms, which has since become worse.  He had an appointment yesterday  at [facility], which he missed because he felt so bad.  Started having  bilateral leg pain similar to his prior pain crisis a couple weeks ago.  He had been using his OxyContin, oxycodone, with only minimal relief of  symptoms.  Pain then started in his abdomen and had about 1 week ago.  Shortly after being seen in our clinic 3 days ago, he began vomiting.  He is unable to keep anything down.  He denies any chest pain, fevers,  shortness of breath, diarrhea, cough, or recurrent headaches.  No sick  contacts.  His pain is currently at a 10/10.  Urine cultures and blood  cultures drawn on [facility] on April 20, 2008, have no growth to-date.  He was  also given 1 dose of ceftriaxone IM in the clinic.  Baseline hemoglobin  seems to have a wide range.  According to our notes, his hemoglobin  ranges between 8 and 9.  According to Hasaan's mother, his hemoglobin  is around  6-7 at baseline since he stopped his hydroxyurea last June  because he is trying to conceive a child with his wife.  Just he started  taking hydroxyurea on his own on Monday, H and H in our office on April 20, 2008 was 6.7 and 17.5 with recheck of 16.2% and today is 5.6 and  14.1.  [place] states he has received preop  transfusions at  [facility], has  not received any exchange transfusion a long time since his last episode  of acute chest.  WBC in the office on April 20, 2008, was 20.4, today is  15.1.  Creatinine on April 20, 2008, was 1.86, today is 1.81 with a  baseline of 0.98.   LABORATORIES AND IMAGING:  Chest x-ray shows no acute findings.  Sodium  is 136, potassium is 4.7, CO2 is 17, BUN is 19, and creatinine is 1.81.  WBC is 15.1, hemoglobin 5.6, hematocrit 14.1, platelets of 264, and MCV  of 93.4.   REVIEW OF SYSTEMS:  Please see HPI.   ALLERGIES:  He is allergic to MANNITOL.   PAST MEDICAL HISTORY:  1. Sickle cell disease followed by [facility], Dr. Loma Newton.  2. Constipation.  3. History of priapism.   FAMILY HISTORY:  Sickle cell trait in mother, father, and sister,  otherwise negative.   SOCIAL HISTORY:  Lives with  his wife and mother in [place].  Does  smoke 1-2 cigarettes per week.  Denies drugs.  Occasional alcohol.  Not  currently working.   PHYSICAL EXAMINATION:  VITAL SIGNS:  His temperature is 98.6, pulse is  112, respiratory rate is 16, blood pressure is 129/75, oxygen saturation  is 94% on 4 L.  GENERAL:  He is alert and underweight.  HEENT:  Eyes, conjunctivae are jaundiced.  Mouth, he has dry mucous  membranes.  LUNGS:  Normal respiratory effort and normal breath sounds.  No  crackles.  HEART:  Normal rhythm, but he is tachycardic.  ABDOMEN:  Soft and nontender with positive bowel sounds.   ASSESSMENT AND PLAN:  A 30 year old with;  1. Sickle cell crisis from history, has been developing crisis for a      couple weeks, now acutely worsened by dehydration from vomiting.      Possibly an acute infectious gastroenteritis, but always concerned      for other infectious etiology as well.  Urine cultures and blood      cultures from few days ago showing no growth to date and has been      afebrile.  We will repeat cultures today and hold off on starting      antibiotics.  We will need to call IV  team for prophylaxis and give      IV hydration.  Zofran for nausea and Dilaudid PCA for pain.  The      patient in addition to p.o. pain medicines, target hemoglobin is      below his baseline, but according to his mother has been running      between 6 and 7 over the past year.  We will go ahead and type and      cross as he will need specific typing for sickle cell disease.      Need to speak with  Dr. Loma Newton at [facility] to get records.  Continue      hydroxyurea once he can tolerate by mouth meds.  Recheck his      reticulocyte count.  2. Renal failure is acute likely prerenal secondary to vomiting and      dehydration rather than sickle cell related, although BUN and      creatinine not elevated.  We will hydrate aggressively.  3. Anemia.  Hemoglobin below baseline.  Talked with blood bank and 2      units are available for him.  He has no current antibodies and has      done well with transfusions in the past.  We will follow post      transfusion H and H.  4. Fluids, electrolytes, nutrition and gastrointestinal.  NPO for now,      clear liquids as he tolerates, hydrate with one-half normal saline      to 125 mL per hour.  5. Disposition.  Will likely require transfusion and possible      antibiotics for infectious disorders, but we will hold off on that.      We will await records from [facility] as well.      Arnette Norris, M.D.  Electronically Signed      Talbert Cage, M.D.  Electronically Signed    TA/MEDQ  D:  04/23/2008  T:  04/23/2008  Job:  [id number]

## 2010-06-28 NOTE — Discharge Summary (Signed)
[name] [name], [name] [name] NO.:  [id number]   MEDICAL RECORD NO.:  [id number]          PATIENT TYPE:  INP   LOCATION:  4702                         FACILITY:  [facility]   PHYSICIAN:  Hortencia Pilar, M.D.    DATE OF BIRTH:  10/20/1980   DATE OF ADMISSION:  06/21/2006  DATE OF DISCHARGE:  06/22/2006                               DISCHARGE SUMMARY   DISCHARGE DIAGNOSES:  1. Sickle cell pain crisis.  2. SS sickle cell disease.   DISCHARGE MEDICATIONS:  1. Percocet 5/325 one to two p.o. q.4-6 as needed.  2. OxyContin 20 mg twice daily as needed for pain.  3. Colace 100 mg twice a day.  4. Vasotec 5 mg, take 1-1/2 pills daily.  5. Hydroxyurea 500 mg, take three times a day.  6. Dulcolax as needed for constipation.   Prescription for Percocet was given, dispensed #30, and a prescription  for the oxycodone was given, dispensed #60.   FOLLOWUP:  Patient has a followup appointment with Dr. Birdie Riddle May 29 at  11:00 a.m.   DIAGNOSTIC STUDIES:  Chest x-ray shows no change from prior.   CONSULTS:  None.   Patient is a 30 year old African-American male with a history of sickle  cell disease who presented with his typical pain crisis in his back.  He  describes his pain as throbbing, pain resolved.   HOSPITAL COURSE:  1. Sickle cell pain crisis.  Patient was admitted, initially placed on      a PCA with good effect.  He was also started on renally dose      Toradol.  His creatinine was 1.3.  He had no findings that were      consistent with acute chest syndrome.  On second day of admission,      his pain had gotten better, he asked to be turned off his PCA and      was given his home dose of OxyContin.  He wished to be discharged      on OxyContin and Percocet the same day.  2. Chronic renal failure.  Patient's baseline creatinine seems to be      1.1.  In the hospital, his initial creatinine was 1.34, but with      rehydration came down to 1.27.   LABORATORY DATA:   Hemoglobin 10, white blood cells 15.1.  Creatinine at  discharge 1.27.  Total bilirubin 3.7, alk phos 21, AST 39, ALT 20.  MCV  was 103 with 3.9% retics, absolute retic count of 86.2.   Annye Asa, M.D., primary MD to follow up.  Please recheck  creatinine so we have a baseline when patient is well.      Hortencia Pilar, M.D.     Margie Ege  D:  06/22/2006  T:  06/22/2006  Job:  [id number]   cc:   Annye Asa, M.D.

## 2010-06-28 NOTE — Discharge Summary (Signed)
NAMEJAMIROQUAI, [name] [name] NO.:  [id number]   MEDICAL RECORD NO.:  [id number]          PATIENT TYPE:  INP   LOCATION:  4703                         FACILITY:  [facility]   PHYSICIAN:  Jamal Collin. Hensel, M.D.DATE OF BIRTH:  08/27/1980   DATE OF ADMISSION:  02/21/2007  DATE OF DISCHARGE:  02/23/2007                               DISCHARGE SUMMARY   ADDENDUM:  The original dictation number was [id number].   Patient is on oxycodone 20 mg b.i.d. at home.  Will discharge on this  medication only.  He will not be discharged on Percocet as indicated in  the previous discharge summary.      Clifton Custard, M.D.  Electronically Signed      Jamal Collin. Andria Frames, M.D.  Electronically Signed    MR/MEDQ  D:  02/23/2007  T:  02/24/2007  Job:  [id number]

## 2010-06-28 NOTE — Consult Note (Signed)
NAMEMARCELL, [name] NO.:  [id number]   MEDICAL RECORD NO.:  [id number]          PATIENT TYPE:  INP   LOCATION:  [id number]                         FACILITY:  [facility]   PHYSICIAN:  Ophelia Charter, M.D.DATE OF BIRTH:  Apr 16, 1980   DATE OF CONSULTATION:  02/21/2007  DATE OF DISCHARGE:                                 CONSULTATION   CHIEF COMPLAINT:  Headaches.   HISTORY OF PRESENT ILLNESS:  The patient is a 29 year old black male  whose history is obtained via the patient, his mother, and fiancee.  They say he has been having headaches over the last 2 months or so.  He  went to bed last night with the headache and woke up with even more  severe headache.  The patient was taken to [facility] via  private vehicle where a CT scan was obtained.  It demonstrated that the  patient had bilateral arachnoid cyst, and he was admitted by Dr. Andria Frames  and neurosurgical consultation was requested.   Presently, the patient is pleasant.  He had a headache.  The patient's  mother stated that he has had a CAT scan approximately 12 years ago,  i.e., when he was 30 years old, but she does not know the results of  this study nor can she remember exactly where it was done.   PAST MEDICAL HISTORY:  1. Positive for sickle cell disease.  2. Proteinuria.  3. Cholelithiasis.   PAST SURGICAL HISTORY:  None.   MEDICATIONS PRIOR TO ADMISSION:  1. Hydroxyurea 500 mg p.o. t.i.d.  2. Enalapril 7.5 mg p.o. daily.  3. OxyContin 20 mg p.o. b.i.d.   ALLERGIES:  The patient has no known drug allergies.   FAMILY MEDICAL HISTORY:  The patient's mother's age is 77, in good  health.  The patient is estranged from his father but knows he is 15 and  as far as he knows is in good health.   SOCIAL HISTORY:  The patient is engaged to be married.  He has no  children.  He lives in [place] with his fiancee.  He denies tobacco,  ethanol, and drug use.  He is a Electronics engineer.  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   REVIEW OF SYSTEMS:  Negative except as above.   PHYSICAL EXAMINATION:  GENERAL:  A pleasant 30 year old black male in no  apparent distress.  HEENT:  Normocephalic and atraumatic.  Pupils are myotic.  Extraocular  muscles are intact.  Oropharynx is benign.  Uvula is midline.  NECK:  Supple without masses or deformities.  THORAX:  Symmetric.  ABDOMEN:  Soft.  EXTREMITIES:  No evidence of deformities.  NEUROLOGIC:  The patient is alert and oriented x3.  Glasgow coma scale  15.  Cranial nerves II through XII are examined and bilaterally grossly  normal.  Vision and hearing are grossly normal bilaterally.  Motor  strength is 5/5 in his bilateral deltoids, biceps, handgrip, quadriceps,  gastrocnemius, and extensor hallucis longus.  The patient's deep tendon  reflexes are normal and symmetric.  Sensory function is intact to light  touch in all tested dermatomes bilaterally.  Cerebellar function is  intact to rapid alternating movements of the upper extremities  bilaterally.   IMAGING STUDIES:  I reviewed the patient's head CT performed at [facility] without contrast on February 21, 2007.  It demonstrates that  the patient has a moderate-sized left sylvian fissure arachnoid cyst,  which creates some mild effect.  There is small left sylvian fissure  arachnoid cyst.  I do not see any evidence of intracranial hemorrhage.   ASSESSMENT AND PLAN:  Bilateral arachnoid cyst.  I discussed the  situation with the patient, his mother, and fiancee (the patient  requested).  I have told them that he may have these cysts his entire  life and generally they are not problematic.  I doubt it has anything to  do with his headaches.  I recommended he be worked up further with the  brain MRI, MRA, and MRV to rule out sinus thrombosis, etc., and to  better characterize these cysts.  I have answered all their questions.  I will follow up with him after his MRI.  This headache has been  chronic  and was not acute on onset, and therefore, I do not think we need to do  a lumbar puncture.      Ophelia Charter, M.D.  Electronically Signed     JDJ/MEDQ  D:  02/21/2007  T:  02/22/2007  Job:  [id number]

## 2010-06-28 NOTE — Discharge Summary (Signed)
[name], [name] NO.:  [id number]   MEDICAL RECORD NO.:  [id number]          PATIENT TYPE:  INP   LOCATION:  6733                         FACILITY:  [facility]   PHYSICIAN:  [name], M.D.DATE OF BIRTH:  29-Jun-1980   DATE OF ADMISSION:  04/23/2008  DATE OF DISCHARGE:  04/24/2008                               DISCHARGE SUMMARY   ADMISSION DIAGNOSES:  1. Sickle cell pain crisis.  2. Acute renal failure.  3. Gastroenteritis.  4. Dehydration.   DISCHARGE DIAGNOSES:  1. Sickle cell disease pain crisis.  2. Resolving acute renal failure.  3. Resolved gastroenteritis.  4. Resolved dehydration.   PROCEDURE PERFORMED DURING ADMISSION:  None.   PERTINENT RADIOGRAPHIC FINDINGS:  The patient had a chest x-ray on  admission that showed chronic interstitial lung changes with no acute  pulmonary process and a chest x-ray on the second day in the [name]  showed stable enlargement of cardiac silhouette reticular pulmonary  findings, but no acute superimposed process identified.   CONSULTS DURING ADMISSION:  None.   HISTORY:  Briefly, this is a 30 year old African American male with  known sickle cell disease followed by [name], who  presented in the ED with worsening pain all over and vomiting that  started 2 weeks prior to admission.  He had been seen in the [name] 3  days prior for the same symptoms, but it has had become worse.  He had  had an appointment the day before admission at [name], which he  missed because he felt some bad, started having bilateral leg pain  similar to prior pain crisis episodes a couple weeks prior.  He will be  using OxyContin and oxycodone with minimal relief of symptoms at home.  Pain then started in his abdomen and had a bowel 1 week ago shortly  after being seen in our [name] 3 days prior to admission.  He then begin  vomiting and was unable to keep anything down and denied any chest pain,  fever, shortness  of breath, diarrhea, cough or recurrent headaches.  No  sick contacts.  Pain was currently at 10/10 at admission.  Urine  cultures and blood cultures were drawn on April 20, 2008, at the [name] and had no growth to date.  He was also given 1 dose of  ceftriaxone IM in the [name].  Baseline hemoglobin seems to have a wide  range, but ranges between 7 and 9.  According to the patient's number,  the hemoglobin was about 6-7 at baseline, since he had to stop taking  hydroxyurea the June prior, because he was trying to conceive a child  with his wife.  Then on Monday prior to admission, he started taking  hydroxyurea on his own 3 tablets 500 mg each.  H and H in the office on  April 20, 2008 was 6.7 and 5.6 on day of admission.  [name] states he  had been receiving preop transfusions at [name], has not received any  exchange of transfusions for long time since his last episode of acute  chest.  WBC in the  office on April 20, 2008 was 20.4 and the day of  admission was 15.1.  Creatinine on April 20, 2008 was 1.86 and on the day  of admission is 1.81 with a baseline of 0.98.   [name] COURSE:  1. Sickle cell pain crisis.  It appears he had been developing crisis      for a couple of weeks now and was acutely worsened by dehydration      from vomiting from acute infectious gastroenteritis.  Urine      cultures and blood cultures were both negative times greater than      24 hours by discharge.  He started on Dilaudid PCA for pain.      Zofran for nausea and hydrated with D5 half-normal saline.  The      patient was also transfused 2 units of packed red blood cells that      brought him up from hemoglobin of 5.62 and hemoglobin of 7.7.      During the night of the first day of admission, the patient was      seen ambulating off the floor and his pain had decreased.  PCA was      discontinued on the day of discharge and his pain had dropped to a      5/10 and he was ambulating without  complaint.  Retic count      increased from 7.5 to greater than 20%.  Vitals remained stable and      remained afebrile throughout the admission.  Only the labs      pertinent were a total bilirubin of 3.4 and an AST of 100.  Other      labs were within normal limits.  This was discussed with [name] and was agreed that he would be discharged with      enough pain medication to get him through to his next appointment      on May 06, 2008 at 9:00 a.m. and that his hydroxyurea would be      restarted at lower dose than normal and than usual and that would      be one 500 mg tab daily.  2. Acute renal failure.  This is improved with hydration.  Creatinine      on the second day dropped to 1.47 from 1.81 and we presumed that      this is prerenal azotemia; however, this will need to be followed      up in an outpatient setting with a repeat creatinine at his next      appointment.  3. Gastroenteritis.  This had resolved.  Vomiting had resolved and he      was tolerating a regular diet by the day of discharge.  4. Dehydration.  The patient's volume status improved greatly with IV      fluid hydration.  He had a urine output of 700 mL per shift on the      day of discharge.  Urinalysis on day of admission was negative for      bacteria, but was positive for large blood, 100 protein.   DISCHARGE CONDITION:  Stable and improved.   DISCHARGE DISPOSITION:  Will be to home.  Follow up will be with Dr.  Mylinda [name] at [name].  The patient is  instructed to call for an appointment in approximately 1 week and with  Dr. Leretha [name], the number there  is [id number]  [id number].  The appointment will be on Wednesday, May 06, 2008 at 9 o'clock  a.m.   MEDICATIONS AT DISCHARGE:  1. Hydroxyurea one 500 mg pill daily.  2. Folic acid 1 mg p.o. daily.  3. OxyContin 30 mg p.o. q.12 h.  4. Oxycodone 5 mg p.o. q.4-6 h. as needed for  pain.  5. Vasotec 7.5 mg daily.  6. 24 of the q.12 h. OxyContin tabs were given and this will get him      through the next 12 days until next appointment and 15 of the      oxycodone tabs were given, 5 mg.      Aundria Mems, MD  Electronically Signed      [name], M.D.  Electronically Signed    TT/MEDQ  D:  04/24/2008  T:  04/25/2008  Job:  [id number]   cc:   [name] Latina, MD  Hematology Dr. Leretha [name], [name]

## 2010-06-28 NOTE — Discharge Summary (Signed)
[name], [name] NO.:  [id number]   MEDICAL RECORD NO.:  [id number]          PATIENT TYPE:  INP   LOCATION:  4703                         FACILITY:  [facility]   PHYSICIAN:  Jamal Collin. Hensel, M.D.DATE OF BIRTH:  01-14-1981   DATE OF ADMISSION:  02/21/2007  DATE OF DISCHARGE:  02/23/2007                               DISCHARGE SUMMARY   PRIMARY CARE PHYSICIAN:  Annye Asa, M.D., [place].   CONSULTATIONS:  Ophelia Charter, M.D., neurosurgery.   HEMATOLOGIST:  Hematologist is a Dr. Robyne Askew at the [facility].   DISCHARGE DIAGNOSES:  1. Sickle cell disease  2. Acute pain crisis.  3. Headache.  4. Arachnoid cyst  5. Cholelithiasis.  6. Proteinuria.   DISCHARGE MEDICATIONS:  1. Hydroxyurea 500 mg p.o. t.i.d.  2. Enalapril 7.5 mg p.o. daily.  3. OxyContin 20 mg t.i.d. p.r.n. pain.  4. Percocet 5/325 one p.o. q.6h. p.r.n. pain.  5. Promethazine 25 mg q.6h. as needed for nausea.   STUDIES:  1. CT of the head without contrast on February 21, 2007 showed large CSF      attenuating lesion involving the left middle cranial fossa and      extending to the left of the fissure. Findings most likely      represent arachnoid cyst.  2. Chest x-ray on February 21, 2007 showed chronic lung changes but no      acute process.  3. Ultrasound of the abdomen on February 21, 2007 showed gallbladder      contracted around gallstones.  No definite gallbladder wall      thickening or evidence for pericholecystic fluid.  No sonographic      Murphy sign.  4. MRI/MRA of brain on February 22, 2007 showed large area of chronic      infarct of the left temporal lobe.  There was a small area of      chronic infarct, left temporal lobe.  There was symmetric signal      abnormality in the periventricular white matter bilaterally,      probably chronic.  Recommended followup MRI in 2 weeks.   LABORATORY DATA:  On discharge, creatinine 0.98.   White blood cell count  13.9, hemoglobin 7.1, platelet count 259.  Total bilirubin 4.1, AST 44,  ALT 23, alk phos 92, albumin 3.4.  Other labs during hospitalization  revealed a reticulocyte percentage 15.0, absolute reticulocyte count  265.5.  Blood cultures were negative x2 to date.  Hemoglobin 8.2 on  admission, decreased to as low as 6.9 and was 7.1 at discharge.   HISTORY OF PRESENT ILLNESS:  A 30 year old African American male with  history of sickle-cell SS disease who presented to the [facility] ER  with severe headaches over the last 2 months.  In the ER, a CT scan was  obtained which showed bilateral arachnoid cysts.  He was admitted for  further evaluation.   HOSPITAL COURSE:  Problem 1.  Headache.  Patient with severe headaches  on admission.  CT showing bilateral arachnoid cysts.  Neurosurgery was  consulted, and  it was felt that the arachnoid cysts were not  contributing to the headaches.  An MRI, MRA, and MRV were obtained which  did show some chronic changes but no signs of thrombosis or acute  stroke.  They are recommending followup MRI in 2 weeks.  Question  whether this is chronic tension or migraine headaches versus a headache  due to an acute sickle cell pain crisis.  He was without meningeal  signs, and so lumbar puncture was not indicated.  His headache improved  over the hospital course, and at the time of discharge, he was without  headache.  His neuro exam was nonfocal throughout the hospital course.   Problem 2.  Arachnoid cysts, as above.  These are felt to be unlikely  contributing to headache.  The patient with have continued monitoring  per neurosurgery.  He has followup with neurosurgery in 2 weeks.   Problem 3.  Sickle cell anemia.  The patient continued on hydroxyurea.  Hemoglobin ranged from 6.9 to 8.0 throughout his hospital stay.  Hemoglobin was 7.1 at discharge.  He did not require any blood  transfusions and was resistant to transfusion at [facility].  He has close followup with Dr. Robyne Askew at the [facility].   Problem 4.  Cholelithiasis.  Patient with known cholelithiasis.  He was  having some right upper quadrant abdominal pain in the days prior to  admission.  He had an increased bilirubin of 6.1, alk phos 120, AST of  45 as an outpatient.  He had an abdominal ultrasound while admitted  which did show gallbladder contracting around gallstones but no acute  cholecystitis.  His abdominal pain did resolve, and he was without any  nausea, vomiting, or right upper quadrant pain at the time of discharge.  Bilirubin at discharge was 4.1.  He will need an outpatient elective  cholecystectomy.  He will follow up with primary care physician for  arrangements of this.   Problem 5.  Proteinuria.  He was continued on enalapril 7.5 mg daily  while in the hospital.   Problem 6.  Pain control.  The patient's pain was well-controlled on  home regimen of oxycodone 20 mg p.o. t.i.d. p.r.n. as well as Percocet  every 6 hours as needed for pain.  He will be discharged on his home  regimen.   FOLLOWUP:  1. Followup with Dr. Dorathy Daft at [facility],      phone number [phone], on Friday, March 15, 2007 at 9:15 a.m.  2. Follow up with Dr. Newman Pies in neurosurgery, phone number      [phone] in 2 weeks.  The patient instructed to call for an      appointment.  3. Follow up with Dr. Robyne Askew at [facility] as      previously planned.   FOLLOWUP ISSUES:  1. He needs a repeat MRI in 2 weeks.  This can be arranged either at      [facility] or here in [place].  2. Cholelithiasis.  Will eventually need outpatient elective      cholecystectomy.      Clifton Custard, M.D.  Electronically Signed      Jamal Collin. Andria Frames, M.D.  Electronically Signed    MR/MEDQ  D:  02/23/2007  T:  02/24/2007  Job:  [id number]   cc:   Ophelia Charter, M.D.  Robyne Askew, M.D.

## 2010-07-01 NOTE — H&P (Signed)
NAMEJARMAN, [name] [name] NO.:  [id number]   MEDICAL RECORD NO.:  [id number]          PATIENT TYPE:  INP   LOCATION:  4736                         FACILITY:  [facility]   PHYSICIAN:  Mayra Neer, M.D.   DATE OF BIRTH:  [birth year]   DATE OF ADMISSION:  02/13/2006  DATE OF DISCHARGE:                              HISTORY & PHYSICAL   PRIMARY CARE PHYSICIAN:  Dr. Dimple Nanas.   CHIEF COMPLAINT:  Sickle cell crisis.   HISTORY OF PRESENT ILLNESS:  This is a 30 year old African American male  with hemoglobin sickle cell SS disease per his mother, who had a couple  more drinks last night for New Year's than usual, now complaining of a  few hours of neck, left chest, bilateral knees and calf pain very  similar to his sickle cell pain.  He describes the neck pain as  throbbing, but able to move his neck freely.  Chest pain is described as  sharp and intermittent.  Can point to the most intense area, and  complains of reproducibility with touch.  No associated shortness of  breath or cough.  No fever, chills, rhinorrhea, or change in his bowel  habits.  No sick contacts.  He did have 1 episode of vomiting in the  emergency department after Percocet, but no other nausea or vomiting  before or since.   REVIEW OF SYSTEMS:  Otherwise negative except per the HPI, and includes  no dysuria.  No shortness of breath.  No diarrhea.  No syncope,  dizziness or lightheadedness.   PAST MEDICAL HISTORY:  1. Sickle cell disease.  2. Constipation.  3. Proteinuria for which he takes Vasotec.  4. Genital warts.   OTHER PHYSICIANS:  The patient is seen by Dr. Al Corpus at [facility] for his  sickle cell.   ALLERGIES:  MANDOL causes itching, and MORPHINE causes headaches.   MEDICATIONS:  1. Hydroxyurea 1500 mg p.o. daily.  2. Vasotec 7.5 mg p.o. daily.  3. Oxycodone 5 mg p.o. q.6 h. p.r.n. for pain and Toradol 10 mg p.o.      q.6 h. p.r.n. for pain, neither of which the patient currently has      a prescription for.   FAMILY HISTORY:  Sickle cell trait in his mother, father and sister;  otherwise, unsure of the family history.   SOCIAL HISTORY:  The patient currently lives in [place] with his  mother, sister and 68-month-old niece.  No smokers in the house.  Currently unemployed.  Denies drug use, tobacco or alcohol.  Father is  in jail.  The patient smokes 2-3 cigarettes per day.   PHYSICAL EXAM:  VITALS:  Temperature 98.5.  Pulse 69.  Blood pressure  111/54.  Respirations 20; 99% on 2 liters nasal cannula.  GENERAL APPEARANCE:  This is a young, [place] American male.  Appears  his stated age, who appears uncomfortable.  MENTAL STATUS:  Alert and oriented x3.  Able to converse and joke.  HEENT:  Atraumatic, normocephalic.  Pupils equal, round and reactive to  light.  Extraocular motor is intact.  Muddy sclerae.  TMs within normal  limits bilaterally.  Moist mucous membranes.  Normal oropharynx.  NECK:  Supple.  CHEST:  Pain around left nipple to palpation.  LUNGS:  Clear to auscultation bilaterally.  No wheezes, rhonchi or  crackles.  Equal breath sounds in all quadrants.  HEART:  Irregular rate with 2/6 systolic ejection murmur loudest at the  bases.  No radiation.  ABDOMEN:  Positive bowel sounds.  Soft.  No masses.  Nontender,  nondistended.  No hepatosplenomegaly.  EXTREMITIES:  No edema.  Tenderness to palpation along both calves and  knees bilaterally that is equal.  No erythema.  PULSES:  Pulses 2+ in all 4 extremities.  GENITAL/RECTAL:  Deferred.  NEURO:  Alert and oriented x3.  Cranial nerves 2-12 intact.  Strength  5/5 in all extremities.  Reflexes 2+.  SKIN:  Warm and dry.   LABS:  White blood cell count 12.8, hemoglobin 10.1, baseline hemoglobin  approximately 9-10, MCV 123.9.  Sodium 135, potassium 4.3, chloride 108,  bicarb 20, glucose 105, BUN 9, creatinine 0.9, calcium 9.1.  EKG showed  sinus rhythm with a sinus arrhythmia, rate of 66.  No evidence  of  dropped beats or pattern to the arrhythmia.  Chest x-ray showed  coarsened interstitial markings and skeletal changes consistent with  sickle cell disease, but no acute process.   ASSESSMENT AND PLAN:  This is a 30 year old African American male with  sickle disease SS, now with a sickle cell pain crisis.  1. Pain crisis:  Likely the trigger is excessive alcohol ingestion      leading to dehydration.  His hemoglobin is actually elevated from      baseline and likely slightly hemoconcentrated.  Will start Dilaudid      PCA and Toradol, as his creatinine function is within normal      limits.  We will hydrate with LR, then half normal saline at 125 mL      per hour.  O2 as needed, though not currently requiring.  No      evidence of acute chest, and I do not think his arrhythmia has      anything to do with his pain.  Colace scheduled for constipation      from narcotics.  Check a retic count in the morning.  Continue      hydroxyurea.  2. Arrhythmia:  The patient has received fentanyl 50 mcg patch.  No      recorded history of this arrhythmia in the past.  No syncope or      lightheadedness associated with it.  We will hold the fentanyl and      start pain meds as above.  Repeat an EKG in the morning.  The      patient is stable, and the rhythm is benign.  We will wait on      Card's consult until repeat EKG once the fentanyl is out of his      system.  (Of note, we removed the patch during our exam.)  3. FEN/GI:  Fluids as above.  Normal diet.  Lytes within normal      limits.  Colace for his constipation.  4. Prophylaxis:  STDs and normal diet.   FULL CODE.           ______________________________  Mayra Neer, M.D.     KS/MEDQ  D:  02/14/2006  T:  02/14/2006  Job:  [id number]

## 2010-07-01 NOTE — Discharge Summary (Signed)
   NAME:  [name], [name]                      ACCOUNT NO.:  [id number]   MEDICAL RECORD NO.:  [id number]                   PATIENT TYPE:  INP   LOCATION:  [id number]                                 FACILITY:  [facility]   PHYSICIAN:  Blane Ohara McDiarmid, M.D.             DATE OF BIRTH:  [birth year]   DATE OF ADMISSION:  09/14/2002  DATE OF DISCHARGE:  09/16/2002                                 DISCHARGE SUMMARY   DISCHARGE DIAGNOSES:  1. Sickle cell pain crisis.  2. Sickle cell anemia.   DISCHARGE MEDICATIONS:  1. Percocet 5/325 one to two tablets p.o. q.4-6h. p.r.n. pain that is     severe.  2. Vasotec 7.5 mg one tablet p.o. daily.  3. Ultram 50 mg tablet one to two tablets every four to six hours p.r.n.     pain.  4. Hydroxyurea 500 mg tablet three tablets once a day.  5. Phenergan 2.5 mg one tablet p.o. q.6h. p.r.n. nausea.   PAIN MANAGEMENT:  The patient was directed that at onset of pain immediately  to take Ultram two tablets and then if pain continues to take one to two  Percocet.   FOLLOW UP:  1. The patient will follow up with Dr. Tacy Dura at [facility] Wednesday, August 25, at 9:30 a.m.  2. The patient will follow up with Dr. __________ at [facility] September 24, 2002, at 2:15 p.m. Wednesday.   PROCEDURE:  None.   CONSULTANTS:  None.   HISTORY OF PRESENT ILLNESS:  Per history and physical summary.   HOSPITAL COURSE:  The patient was admitted on August 1 with history of low  back pain for a few days. The patient was given Tylenol #3 to manage pain,  but this does not help. He was admitted with sickle cell pain crisis and was  placed on Dilaudid high dose PCA and Toradol. With these medications, the  patient noticed significant pain reduction and requested to be placed on  Percocet and to be sent home. The patient had no signs and symptoms of acute  chest or priapism.    DISCHARGE LABORATORY DATA:  Hemoglobin 8.5, hematocrit  23.9, MCV 121.8,  platelets 255. Sodium 141, potassium 4.3, chloride 113, CO2 23, glucose 97,  BUN 10, creatinine 0.9. Bilirubin 3.2, alkaline phosphatase 111, SGOT 38,  SGPT 17, total protein 5.9, albumin 3.6, calcium 9.      Adrian Saran, MD                              Acquanetta Sit, M.D.    AW/MEDQ  D:  09/18/2002  T:  09/19/2002  Job:  [id number]

## 2010-07-01 NOTE — H&P (Signed)
NAME:  [name] [name], [name] [name]                      ACCOUNT NO.:  [id number]   MEDICAL RECORD NO.:  [id number]                   PATIENT TYPE:  INP   LOCATION:  [id number]                                 FACILITY:  [facility]   PHYSICIAN:  Jamal Collin. Hensel, M.D.             DATE OF BIRTH:  24-Mar-1980   DATE OF ADMISSION:  03/14/2003  DATE OF DISCHARGE:                                HISTORY & PHYSICAL   CHIEF COMPLAINT:  Sickle cell pain crisis.   PRIMARY CARE PHYSICIAN:  Eliezer Lofts, M.D.   HISTORY OF PRESENT ILLNESS:  The patient is a 30 year old black male with  sickle cell disease who was recently admitted January 25 through March 11, 2003, for sickle cell pain crisis and now presents with recurrent left-sided  back/flank pain extending from the lower ribs to the hips that began this  morning.  This location and quality of pain is typical for his pain crises.  He also describes a throbbing pain on the top back of his head that actually  began the night prior to admission and is new for him.  Since discharge from  the hospital, he has taken one Oxycodone 5 mg pill every night.  He denies  taking any pain medications on the day of admission.  He did vomit four  times this morning, but believes that he has still been able to stay well  hydrated by taking in oral fluids.  He describes his pain as greater than 10  out of 10 despite IV and IM pain medications in the emergency department.   REVIEW OF SYSTEMS:  No fever, chills, chest pain, palpitations, shortness of  breath, nausea, diarrhea, rash, urinary complaints with priapisms, he does  endorse photophobia.   PAST MEDICAL HISTORY:  Sickle cell with pain crises.  A history of acute  chest and priapisms.  His baseline hemoglobin is 9 to 10.  He does have a  history of proteinuria for which he takes enalapril.  He also has a history  of elevated liver enzymes in April of 2002 as well as at his last admission.  This most recent elevation was  deemed secondary to excessive Tylenol use.   MEDICATIONS:  1. Hydroxyurea 1500 mg daily.  2. Enalapril 7.5 mg daily.  3. Oxycodone 5 mg p.o. q.6h p.r.n. pain.  4. Tylenol 650 mg p.o. q.4h p.r.n. pain.   ALLERGIES:  MANDOL.   PAST SURGICAL HISTORY:  None.   SOCIAL HISTORY:  The patient lives with his parents, sister, and niece.  No  household smoke.  He attends [employer], denies alcohol, tobacco, or  other drug use.   FAMILY HISTORY:  Negative except for sickle cell trait in his mother,  father, and sister.   PHYSICAL EXAMINATION:  VITAL SIGNS:  Temperature 98.1, blood pressure  105/51, heart rate 93, respiratory rate 22, O2 saturation 93% on room air.  GENERAL:  The patient is drowsy and  is difficult to stay awake following IM  Dilaudid and Phenergan in the ER.  He is a slender, young, black male.  HEENT:  Sclerae white and noninjected, PERRL, EOMI, slightly dry mucous  membranes.  OP clear without erythema or exudates.  NECK:  Supple with no lymphadenopathy.  LUNGS:  Clear to auscultation bilaterally with no wheezes, rhonchi, or  rales.  No increased work of breathing or retractions.  HEART:  Regular rate and rhythm with 2/6 systolic ejection murmur, no rub or  gallop.  EXTREMITIES:  No edema.  ABDOMEN:  Soft, nontender, and nondistended with normal bowel sounds and no  appreciable hepatosplenomegaly.  SKIN:  Without rash, but with patches on his left upper back and right arm.  BACK:  He had mild tenderness to palpation on the left side of his back and  flank from the lower ribs to the hip.  No CVA, spinal, or other paraspinal  tenderness to palpation.  NEUROLOGY:  Cranial nerves II-XII grossly intact.  Grip strength strong and  equal bilaterally.  No focal deficits and moves all extremities  symmetrically.   LABORATORY DATA:  I-STAT showed a hemoglobin of 9.2, hematocrit 27, serum  sodium 140, potassium 4.6, chloride 109, bicarb 29, BUN 13, creatinine 0.9,   glucose 105, pH 7.353, pCO2 41.8.  Reticulocyte count pending.   Chest x-ray showed chronic changes and nothing acute.   ASSESSMENT:  A 30 year old black male with sickle cell disease.   Problem 1.  Sickle cell pain crisis.  Continue IV fluid hydration and  Hydroxyurea. He has received 4 mg of Dilaudid and 50 mg of Phenergan since  being in the emergency room, so he is now sleeping and comfortable.  We will  put him on telemetry to monitor his vitals closely.  Since he is at a risk  for respiratory depression from pain medication used as I had a patient die  in the past in the hospital from overuse of narcotic pain medications to  treat sickle cell pain crisis.  Follow his pain closely as well as his  respiratory status.  Apparently had good pain control during his recent  hospitalizations per his report and per the old chart on Oxy-IR 5 mg q.6h so  we will go ahead and continue this as well as Toradol 50 mg IV, as he had no  relief with ibuprofen during his most recent hospitalization.  Take a chest  x-ray to look for signs of acute chest, though, he has no respiratory  symptoms or signs on examination.  However, continue to follow for  development of acute chest.  His hemoglobin is stable at 9.2 which has been  his baseline.   Problem 2.  History of elevated transaminases.  At his most recent  hospitalization, this was deemed secondary to his excessive Acetaminophen  ingestion, but apparently this has been going on in the past and was deemed  secondary to his sickle cell.  Will check today to see if this has improved.  He denies any recent acetaminophen use.   Problem 3.  History of proteinuria.  Will continue using enalapril.  Unsure  how long he should remain on enalapril as he had no protein on his UA on his  most recent admission.   Problem 4.  Headache with photophobia.  No focal neurologic signs and no indication for CT or neurologic workup at this time.  Follow clinically.   May be a pain crisis in the bones of his skull.  Blima Ledger, M.D.                 William A. Andria Frames, M.D.    JM/MEDQ  D:  03/15/2003  T:  03/15/2003  Job:  [id number]   cc:   Eliezer Lofts, MD  Fax: [phone]

## 2010-07-01 NOTE — H&P (Signed)
NAMEBRITTIN, [name] NO.:  [id number]   MEDICAL RECORD NO.:  [id number]           PATIENT TYPE:   LOCATION:                                 FACILITY:   PHYSICIAN:  Jamal Collin. Hensel, M.D.DATE OF BIRTH:  March 12, 1980   DATE OF ADMISSION:  09/27/2004  DATE OF DISCHARGE:                                HISTORY & PHYSICAL   CHIEF COMPLAINT:  Back pain since 6 a.m.   HISTORY OF PRESENT ILLNESS:  [name] [name] is a 30 year old African-American male  with sickle cell with history of multiple pain crises and priapism admitted  to the hospital. [facility] with back pain since 6 a.m. not relieved by 2 Percocet at home  which will usually relieve mild crises. He describes his pain as severe in  his mid to lower back associated with some shortness of breath. Denies any  cough, headache, leg or joint pain. This is very similar to his previous  pain crises. The pain is bilateral.   CONSTITUTIONAL:  Denies any fever, denies any palpitations, denies any GI or  GU symptoms.   PAST MEDICAL HISTORY:  Significant for sickle cell disease, constipation and  proteinuria. His baseline hemoglobin is 9-10, also has a history of acute  chest, priapism and genital warts.   MEDICATIONS:  1.  Colace p.r.n.  2.  Hydroxyurea 1500 mg daily.  3.  Oxycodone 5 mg p.o. q.6 p.r.n.  4.  Vasotec 7.5 mg p.o. daily.   ALLERGIES:  MANDOL.   FAMILY HISTORY:  Sickle cell trait in mother and father.   OBJECTIVE:  VITAL SIGNS:  Temperature 98.0, blood pressure 133/74, heart  rate 81, weight 127.  GENERAL:  This is a patient laying on the exam table appearing in severe  pain with towel over his head moaning.  MENTAL STATUS:  Alert and oriented x3.  HEENT:  Head atraumatic normocephalic. PERRLA. EOMI. Mild icteric.  NECK:  Supple, no lymphadenopathy, no JVD, no thyromegaly.  LUNGS:  CTAB.  Decreased breath sounds especially in the bases.  HEART:   Regular rate and rhythm, no murmurs.  ABDOMEN:  Soft, NT, ND. No masses. No hepatomegaly. Spleen not palpated.  BACK:  Mildly tender to touch, no spasm of musculature.  EXTREMITIES:  Normal range of motion, no edema, +2, neurologically intact.  Skin intact.   LABORATORY DATA:  Pending for admission.   ASSESSMENT/PLAN:  [name] [name] is a 30 year old African-American male with sickle  cell disease.   Pain crisis, rule out acute chest. Admit. If pain control from previous  admission is Dilaudid PCA high dose with 1 mg basal rate plus Toradol works  best for him will start there. Check a CBC, retic count, CMP, chest x-ray PA  and lateral and blood cultures x2. Continue home meds, hydroxyurea and  Vasotec and follow closely.      [name], MD    ______________________________  Jamal Collin Andria Frames, M.D.    JS/MEDQ  D:  09/27/2004  T:  09/27/2004  Job:  [id number]

## 2010-07-01 NOTE — Discharge Summary (Signed)
[name] [name], CUBITT NO.:  [id number]   MEDICAL RECORD NO.:  [id number]          PATIENT TYPE:  INP   LOCATION:  5738                         FACILITY:  [facility]   PHYSICIAN:  Verner Chol, MD DATE OF BIRTH:  09/28/1980   DATE OF ADMISSION:  01/20/2004  DATE OF DISCHARGE:  01/23/2004                                 DISCHARGE SUMMARY   PRIMARY CARE PHYSICIAN:  Eliezer Lofts, M.D.   DISCHARGE DIAGNOSIS:  Sickle cell anemia with acute chest.   DISCHARGE MEDICATIONS:  1.  Hydroxyurea 1500 mg by mouth daily.  2.  Vasotec 7.5 mg by mouth daily.  3.  Avelox 400 mg by mouth daily x 7 days.  4.  Percocet 5/325 mg one to two tablets by mouth every six hours as needed      for pain.   FOLLOWUP:  The patient is being discharged on the weekend.  The patient is  aware that he needs to call the family practice center and schedule a  followup appointment with Dr. Diona Browner.   PROCEDURES AND DIAGNOSTIC STUDIES:  1.  EKG showing no acute ST elevations.  2.  Chest x-ray on January 19, 2004, showing cardiomegaly with increased      pulmonary vascularity, chronic interstitial changes and also with      questionable mild airspace disease/pneumonia in the left lower lung.  3.  Repeat chest x-ray on January 21, 2004, with interval worsening of      basilar aeration.  Cannot rule out the possibility of an early      infiltrate in the left base.  Also a small amount of fluid in the left      pleural space has developed in the interval.   CONSULTANTS:  None.   ADMISSION HISTORY AND PHYSICAL:  The patient is a 30 year old African  American male with a known history of sickle cell anemia with a history of  priapism, who initially presented to the [facility] Emergency Room on  January 18, 2004.  He was not febrile, was stable and was discharge home  from the emergency room with Percocet and to continue his home medications.  He returned to the emergency room on January 20, 2004,  with increased pain  in the left side of his chest along with upper and mid back.  Before the  patient's return to the emergency room on January 20, 2004, he had in the  interval developed a temperature up to 101 degrees.   HOSPITAL COURSE:  SICKLE CELL ANEMIA WITH ACUTE CHEST:  This patient has a  known history of sickle cell anemia.  He had fever, findings on x-ray,  findings on physical exam and pain that was not controlled with p.o.  medications.  He was admitted and placed on Dilaudid PCA.  Good pain control  was achieved with high-dose Dilaudid PCA with a 1 mg/hr basal rate.  The  patient was also given Toradol injections.  The patient's pain gradually  responded to the PCA.  He was decreased to low-dose PCA and then  transitioned over to oral pain medications, Percocet 5/325 mg  one to two  q.6h. p.r.n. pain.  For infection, the patient was initially treated with  Rocephin.  Azithromycin was added for comfort __________.  Given the  patient's second x-ray findings, he was switched to Avelox.  After switching  to Avelox, he remained afebrile and was stable at the time of discharge.  The patient was also continued on his home medications of hydroxyurea and  Vasotec during this hospitalization.  The patient initially had a modest O2  requirement.  The patient was weaned from O2 during this hospitalization and  at the time of discharge the patient was stable without shortness of breath  on room air.   LABORATORY DATA:  Intake CBC showed hemoglobin 10.4, hematocrit 29.4, white  count 11.5 and reticulocyte count 131 with reticulocyte percentage 5.6.  CMP  within normal limits with the exception of a total bilirubin at 2.4.  Blood  culture with no growth to date at the time of discharge.  Urinalysis  negative.  Discharge CBC with white blood cell count 9.5, hemoglobin 8.1,  hematocrit 22.1 and platelet count 273.       GSD/MEDQ  D:  01/23/2004  T:  01/24/2004  Job:  [id number]   cc:    Eliezer Lofts, MD  Fax: [phone]

## 2010-07-01 NOTE — H&P (Signed)
NAME:  [name] [name], [name] [name]                      ACCOUNT NO.:  [id number]   MEDICAL RECORD NO.:  [id number]                   PATIENT TYPE:  INP   LOCATION:  5031                                 FACILITY:  [facility]   PHYSICIAN:  Georgana Curio, Dr.               DATE OF BIRTH:  [birth year]   DATE OF ADMISSION:  01/14/2003  DATE OF DISCHARGE:                                HISTORY & PHYSICAL   PRIMARY PHYSICIAN:  Dr. Ocie Bob.   ATTENDING PHYSICIAN:  Billey Chang, M.D.   CHIEF COMPLAINT:  Pain.   HISTORY OF PRESENT ILLNESS:  The patient is a 30 year old African American  male with a history of sickle cell anemia who presents to the emergency room  with pain in his chest and back.  He ran out of his Tylenol No. 3 a few  months ago, so he has been using only Bayer Aspirin at home which are not  controlling his pain.  His pain is across his lower chest and across his  entire back, onset three days ago and worsening since then.  No  precipitating even or recent illness known.  He also complains of a dry  cough x3 days with clear rhinorrhea and admits that he often self induces  this cough to see if he will have a productive cough.  No respiratory  distress or dyspnea. No pain in the extremities.  No headaches.  The patient  was given 2 mg of IV Dilaudid and 12.5 mg of IV Phenergan in the emergency  room with no relief.   REVIEW OF SYSTEMS:  No fevers or chills, no change in bowel or bladder  habits, no seizures or headaches.  No vision change.  No rash.  The patient  did have a nose bleed two days ago which was self limited, otherwise as  above.   PAST MEDICAL HISTORY:  1. Sickle cell disease status post episodes of acute chest and priapism.  2. Proteinuria.   MEDICATIONS:  1. Hydroxyurea 1500 mg p.o. daily.  2. Tylenol with codeine p.r.n. pain.  3. Vasotec 7.5 mg p.o. daily.   ALLERGIES:  Mandol causes hives.   FAMILY HISTORY:  Sickle cell trait in the mother, father and  sister.   SOCIAL HISTORY:  He lives in [place] with his girlfriend and works part  time at the [employer].  He denies alcohol, drugs and smoking.   PHYSICAL EXAMINATION:  VITAL SIGNS:  Afebrile, pulse 98, respirations 20,  blood pressure 110/57, 98% O2 saturations on room air.  GENERAL:  He is sleepy but easily arousable, in no apparent distress.  HEENT:  Unremarkable.  CARDIOVASCULAR:  Regular rate and rhythm, 2/6 systolic murmur.  LUNGS:  Clear to auscultation bilaterally, no wheezes or crackles.  NECK:  Supple, no lymphadenopathy.  BACK:  Tender to palpation diffusely.  ABDOMEN:  Soft, nontender, nondistended without hepatosplenomegaly.  SKIN:  No bruising.   LABORATORY DATA/STUDIES:  A chest x-ray shows nothing acute, stable left  base scar, interstitial prominence, and cardiomegaly.  WBC 9.7, hemoglobin  9.8, platelets 234,000 with target cells, Howell-Jolly bodies, and sickle  cells.  Reticulocyte count 8.9%.   ASSESSMENT/PLAN:  63. A 30 year old [place] American male with sickle cell disease in acute     pain crisis.  We will admit for acute pain crisis on PCA Dilaudid, p.o. ibuprofen, IV  fluids and oxygen.  Hemoglobin is baseline of 9 to 10, so there is no need  for a transfusion currently, but we will watch.  No sign of acute chest  except complaint of cough which is unimpressive at this time and could be  either self induced or secondary to a viral upper respiratory infection.  Therefore, we will not given any antibiotics for now, especially given  normal white blood cell count, no fevers, and normal exam and chest x-ray.  If any respiratory events overnight, we will recheck two-view chest x-ray.  Transition to p.o. pain medications when possible.  1. Of note, the patient's sickle cell disease doctor at [facility], Dr.     Jacqualin Combes, has apparently moved out of state, so the patient will need to be     set up for followup with his new doctor at [facility] at the time of  discharge.     Also need to advise the patient to make sure he has routine appointments     with his primary M.D. so he does not run out of his home pain medicines.      [name] [name], M.D.                 Georgana Curio, Dr.    Yvette Rack  D:  01/15/2003  T:  01/15/2003  Job:  [id number]   cc:   Ocie Bob, Dr.

## 2010-07-01 NOTE — H&P (Signed)
[name], [name] NO.:  [id number]   MEDICAL RECORD NO.:  [id number]          PATIENT TYPE:  INP   LOCATION:  5731                         FACILITY:  [facility]   PHYSICIAN:  Billey Chang, M.D.     DATE OF BIRTH:  Oct 18, 1980   DATE OF ADMISSION:  11/08/2005  DATE OF DISCHARGE:                                HISTORY & PHYSICAL   PRIMARY CARE PHYSICIAN:  [name] [name], M.D.   CHIEF COMPLAINT:  Pain crisis.   HISTORY OF PRESENT ILLNESS:  This is a 30 year old African-American male  with sickle cell disease (SS per mom) with a history of multiple pain crises  including acute chest and priapism, seen in the clinic this morning  complaining of bilateral leg pain with low back pain x2-3 days.  It started  while he was at rest.  He denies any increased exercise, working, or  exercising outside.  He has had recent congestion with a productive cough x1  week that is much improved.  He denies chest pain, dysuria, headache, leg  edema.  Mild abdominal pain that started this afternoon (lower and  bilaterally).  He has vomited twice in the ED but otherwise without GI  symptoms and without a fever.  He had Toradol in the clinic x1 and did not  take any oxycodone this afternoon.  The pain was helped by the Tylenol but  has gotten worse over the afternoon and is now in his left lower extremity  only with increased pain in his lower back bilaterally, with some radiation  around to his lower abdomen bilaterally.   REVIEW OF SYSTEMS:  The patient denies malaise, weight loss, chest pain,  changes in his bowel habits, dysuria, priapism, rashes or lesions or trauma.  He does endorse positive mild shortness of breath since arriving to the ED  that he attributes to pain.  He also notes per the HPI he vomited twice  since arriving to the ED.  The rest of the review of systems was negative.   PAST MEDICAL HISTORY:  1. Sickle cell pain, SS disease per mom.  His baseline hemoglobin is  9-10.  2. Constipation.  3. Proteinuria, for which he takes Vasotec.  4. Genital warts.   OTHER PHYSICIANS:  The patient is seen by Dr. Maryjean Morn at [facility] for his sickle  cell.   ALLERGIES:  MANDOL causes itching.  MORPHINE causes headaches.   MEDICATIONS:  1. Hydroxyurea 1500 mg p.o. daily.  2. Vasotec 7.5 mg p.o. daily.  3. Oxycodone 5 mg p.o. q.6h. p.r.n.  4. Toradol 10 mg q.6h. p.o. p.r.n.   FAMILY HISTORY:  Sickle cell trait in mother, father and sister.  Otherwise  unsure of family history.   SOCIAL HISTORY:  The patient currently lives in [place] with his mother,  sister and 5-month-old niece.  No smokers in the house.  Currently  unemployed.  Denies drug use, tobacco or alcohol.  Father is in jail.   PHYSICAL EXAMINATION:  VITAL SIGNS:  Temperature is 97.7, heart rate 98,  respirations 22, blood pressure 146/92.  95% on room air.  GENERAL:  This  is a supine in bed African-American male, appears his stated  age, who is talkative but does appear to be in pain.  HEENT:  Atraumatic and normocephalic.  Mild scleral icterus.  Pupils equal,  round, and reactive to light, extraocular movements intact.  Oropharynx  normal except for some dry mucous membranes.  No lesions or abrasions.  NECK:  Supple without lymphadenopathy.  LUNGS:  Clear to auscultation bilaterally.  No wheezes, rhonchi or crackles.  HEART:  Regular rate and rhythm, possibly a 2/6 systolic ejection murmur  that does not radiate and is heard loudest at the base.  No rubs or gallops.  ABDOMEN:  Slightly decreased bowel sounds in all four quadrants and abdomen  appears slightly distended, but this may just be the patient's body habitus.  Positive voluntary guarding prevented me from palpating much of his abdomen.  He is able to palpate quite deeply without rebound and only with mild pain  with bilateral lower abdominal palpation.  I was unable to palpate a spleen  tip or the liver edge.  EXTREMITIES:  2+ pulses  in all extremities.  No edema in either lower  extremity.  His left lower extremity has tenderness to palpation along the  anterior aspect of the lower calf, negative Homans sign, no edema, erythema,  evidence of infection or step-off.  NEUROLOGIC:  Nonfocal.  SKIN:  Warm and dry.  Good skin turgor.   LABORATORY DATA AND TESTS:  This morning in clinic the patient's hemoglobin  was 10.6.  UA showed few wbc's and few rbc's but negative nitrite, negative  leukocyte esterase.  Currently an initial set of labs showed a hemoglobin of  9.9, hematocrit of 29.0.  Chemistries within normal limits except for a  creatinine of 5.9, which was repeated and showed normal chemistries with a  creatinine of 1.3 and a hemoglobin of 10.0.  Reticulocyte count 5.8%.  LFTs  within normal limits except for a total bilirubin of 3.3 and an AST of 43.  Chest x-ray did not show any acute process but did show cardiomegaly and  mild bronchitic changes that were consistent with the last chest x-ray  earlier in the month.   ASSESSMENT AND PLAN:  This is a 30 year old African-American male with  sickle cell SS disease, now in a pain crisis.   1. Sickle cell pain crisis.  Pain in his lower extremity without edema or      trauma, and pain in his lower back seems more muscular than spinal and      not costovertebral angle distribution.  I think this is a pain crisis      likely brought on by slight dehydration from a mild resolving upper      respiratory infection.  He has been afebrile this entire duration.  I      do not think he has acute chest given he is afebrile and no white blood      cell count, cough is improving, and chest x-ray is negative.  We will      continue IV fluids, keep him on O2, and control his pain.  Toradol      works well for him but there are two creatinines in the computer      approximately 10 minutes apart.  One is 1.3 and one is 5.9.  will check     a BMET now but until can ensure normal  kidney function, will hold on      Toradol.  Dilaudid PCA  for now.  Will continue his hydroxyurea.  No      signs or symptoms of infection, so no antibiotics at this time.  Will      check a UA and urine culture given his back pain but will hold on blood      cultures until he spikes.  His bilirubin is increased, but his      hemoglobin is at baseline of 9-10.  Will check hemoglobin in four hours      to ensure it is not falling.  His platelets are within normal limits,      so I am not worried about splenic sequestration.  2. Elevated creatinine.  There are two values in the computer that are      much different.  I believe 1.3 to be the correct one given his age,      BUN, no history of elevated creatinine on his past admission a month      ago.  We will continue his fluids and hold his Vasotec and Toradol      until confirmation with a BMET of a normal creatinine.  This is likely      a spurious value and he is only slightly dehydrated.  3. Fluids, electrolytes, and nutrition/gastrointestinal.  Full diet.      Normal saline at 150 mL/hr., at least overnight.  Will follow his      electrolytes.  Given his decreased bowel sounds and increased narcotic      use, will check a KUB and start him on Colace.  4. Prophylaxis.  Out of bed t.i.d. for DVT prophylaxis, as his legs are in      pain and I believe he can walk once his pain is controlled.  I do not      want to place SCDs on painful legs in crisis.  Full diet for GI      prophylaxis.  5. Murmur.  The patient has a slight murmur that I suspect is a flow      murmur, but we will need to verify a history of an echo and potentially      get one if there has not been one done in the past couple of years.   Full code.     ______________________________  Mayra Neer, M.D.    ______________________________  Billey Chang, M.D.    KS/MEDQ  D:  11/09/2005  T:  11/10/2005  Job:  [id number]

## 2010-07-01 NOTE — Discharge Summary (Signed)
NAME:  [name] [name], [name] [name]                      ACCOUNT NO.:  [id number]   MEDICAL RECORD NO.:  [id number]                   PATIENT TYPE:  INP   LOCATION:  3015                                 FACILITY:  [facility]   PHYSICIAN:  Vickki Hearing, M.D.                  DATE OF BIRTH:  February 23, 1980   DATE OF ADMISSION:  03/15/2002  DATE OF DISCHARGE:  03/19/2002                                 DISCHARGE SUMMARY   PRIMARY CARE PHYSICIAN:  Raeanne Gathers, M.D.   DISCHARGE DIAGNOSES:  1. Sickle cell anemia, hemoglobin SS disease.  2. Pain crisis low back.  3. Macrocytic anemia.   DISCHARGE MEDICATIONS:  1. Hydroxyurea 500 mg p.o. t.i.d.  2. Enalapril 7.5 mg p.o. q.d.  3. Ibuprofen 800 mg p.o. t.i.d. p.r.n. pain.  4. Percocet 5/325 mg one to two tablets p.o. q.6h. p.r.n. pain.   HISTORY OF PRESENT ILLNESS:  This is a 30 year old African American male who  presents with a four-day history of worsening left lower back pain.  He was  seen at the [facility] approximately three days prior to  admission for similar complaint.  At that time he was given Percocet but it  was not helping.  He does report it is similar to his sickle cell pain  crisis in the past.  He also acknowledges one to two days of nausea and  vomiting but denies any abdominal pain.   HOSPITAL COURSE:  PROBLEM #1 -  SICKLE CELL DISEASE:  The patient presents  with a baseline hemoglobin approximately 9.0.  He is followed by Dr. Jacqualin Combes at  [facility].  Upon presentation, his hemoglobin count was 8.9.  This  stayed fairly stable while in hospital with the lowest reaching 7.4.  His  total bilirubin was elevated at 2.8 but he does have a history of nonbiliary  sludge.  Throughout his hospitalization, sickle cell was treated with  hydroxyurea 500 mg p.o. t.i.d.   PROBLEM #2 -  PAIN CRISIS:  The patient was initially in significant  discomfort and started on Dilaudid PCA.  Two chest x-rays were obtained  during his  hospitalization which revealed any signs which did not reveal any  signs of acute infectious process.  He did not report any chest pain or  shortness of breath and was afebrile during his entire stay.  Throughout his  hospital stay,  his Dilaudid PCA was converted to p.o. morphine and  gradually decreased until his pain was controlled on p.o. Percocet and  ibuprofen.  He will be discharged on these medications as noted above.   PROBLEM #3 -  MACROCYTIC ANEMIA:  The patient does have a history of sickle  cell which would account for his anemia but his MCV was elevated at 121.  Consequently, a vitamin B12 and folate level were checked which were within  normal limits.  A thiamine level was also checked which was pending  at the  time of discharge.  Possible etiologies do include his elevated reticulocyte  count and increase in the mean corpuscular volume secondary to hydroxyurea.  This can be monitored as an outpatient.   CONSULTATIONS:  None.   PROCEDURE:  1. On March 15, 2002, portable chest x-ray.  2. On March 17, 2002, a two-view chest x-ray.   SPECIAL INSTRUCTIONS:  The patient was advised to return to the [facility] for increasing pain,  shortness of breath, fevers, or for any other concerns.   FOLLOW UP:  1. Dr. Baxter Flattery at [facility] on Tuesday, March 25, 2002, at     2:10 p.m.  2. Dr. Jacqualin Combes.  The patient was advised to keep his regular appointments.  He     does acknowledge that he one coming up in February.                                               Vickki Hearing, M.D.    AK/MEDQ  D:  03/19/2002  T:  03/19/2002  Job:  [id number]   cc:   Raeanne Gathers, M.D.  [facility]  [place], [place] [address]  Fax: [phone]

## 2010-07-01 NOTE — Discharge Summary (Addendum)
[name] [name], [name] [name] NO.:  [id number]   MEDICAL RECORD NO.:  [id number]          PATIENT TYPE:  INP   LOCATION:  5028                         FACILITY:  [place]   PHYSICIAN:  Karlton Lemon, M.D.    DATE OF BIRTH:  [birth year]   DATE OF ADMISSION:  09/16/2005  DATE OF DISCHARGE:  09/18/2005                                 DISCHARGE SUMMARY   TEAM:  Family practice teaching service.  Attending [place], intern  [place].   ADMIT DATE:  September 16, 2005.   DISCHARGE DATE:  September 18, 2005.   ADMISSION DIAGNOSES:  1.  Sickle cell vaso-occlusive crisis.  2.  Scleral icterus.  3.  HPV.   DISCHARGE DIAGNOSES:  1.  Sickle cell  crisis.  2.  Scleral icterus.  3.  HPV.   CONSULTANTS:  None.   PROCEDURES:  Chest x-ray x2.   ADMIT H&P:  See full H&P for complete details.  In brief, this is a 30-year-  old African-American male with a history of sickle cell disease who  presented to the [facility] with left hip and thigh pain that has  progressed to bilateral arm and leg pain.  He stated this is similar to a  previous sickle cell crisis.  Denied chest pain, fever, cough, ____ QA                                                           MARKER: 80____.  He did have slight shortness of breath.  His           immunizations are up-to-date including pneumococcus.   ADMIT PHYSICAL EXAM:  The patient was afebrile, 98% on room air.  Scleral  icterus was noted.  Lungs were clear to auscultation bilaterally.  He had an  otherwise normal physical exam.  There were no labs or studies available on  admission.   HOSPITAL COURSE:  1.  Sickle cell vaso-occlusive crisis.  The patient was placed on Dilaudid      PCA with a 1 mg basal rate and full-dose demand.  Chest x-ray after      admission was normal which was done for his subjective occasional      dyspnea.  He was started on intravenous fluids at 150 mL for an hour      which was decreased to off on discharge.  On  his CBC his hemoglobin was      9.9 with a reticulocyte count of 7.3.  His white blood cell count was      9.0.  A chest x-ray on post-admission day #1 was done after intravenous      fluid resuscitation which also showed no infiltrate.  This chest x-ray      was done because he had a temperature up to 100.4 and was satting only      at 90% on room air.  He was not placed on  any antibiotics and no blood      cultures were drawn.  He was transitioned to p.o. Oxycodone 10 mg as      needed every six to eight hours and also ibuprofen 800 mg three times a      day.  He was discharged on ibuprofen and Oxycodone 5 mg one tablet every      six hours as needed.  2.  Scleral icterus.  This was not new per the patient history.  His      bilirubin was noted to be 2.5.  It is likely due to hemolysis due to the      fact that his other liver function tests were within normal limits.  3.  HPV.  Follow up in family practice center.   DISCHARGE MEDICATIONS AND INSTRUCTIONS:  1.  Oxycodone 5 mg q.6h. p.r.n. for pain.  2.  Ibuprofen 800 mg t.i.d. p.r.n. pain.  3.  He is to call or return for fevers, worsening of pain, dyspnea or other      concerns.   CONDITION:  Good, improved.   FOLLOWUP:  The patient to call [facility] within one  to two weeks to schedule an appointment Dr. Birdie Riddle.   ISSUES FOR FOLLOWUP:  Check on subjective symptoms of shortness of breath,  cough.           ______________________________  Karlton Lemon, M.D.     SH/MEDQ  D:  09/19/2005  T:  09/19/2005  Job:  [id number]   cc:   Annye Asa, M.D.

## 2010-07-01 NOTE — Discharge Summary (Signed)
[name] [name], GUERRIERI NO.:  [id number]   MEDICAL RECORD NO.:  [id number]          PATIENT TYPE:  INP   LOCATION:  5032                         FACILITY:  [place]   PHYSICIAN:  Jamal Collin. Hensel, M.D.DATE OF BIRTH:  [birth year]   DATE OF ADMISSION:  09/27/2004  DATE OF DISCHARGE:  09/29/2004                                 DISCHARGE SUMMARY   DATE OF ADMISSION:  September 27, 2004   ADMISSION DIAGNOSIS:  Sickle cell pain crisis.   DATE OF DISCHARGE:  September 29, 2004   DISCHARGE DIAGNOSIS:  Sickle cell pain crisis.   DISCHARGE MEDICATIONS:  1.  Oxycodone 5 mg 1 p.o. q.6h. as needed for pain.  2.  Toradol 10 mg one tablet p.o. q.6h. for pain x4 pills not to exceed more      than three days.  Please take with food.  3.  Hydroxyurea 1500 mg take one tablet p.o. daily.  4.  Vasotec 7.5 mg take one tablet p.o. daily.  5.  Colace 100 mg take as directed for constipation.   PROCEDURES:  None.   CONSULTS:  None.   BRIEF HOSPITAL COURSE:  Mr. Sibbett is a 30 year old African-American male  with sickle cell with a history of multiple pain crises and priapism  admitted to the hospital previously before and came to the [facility] on September 27, 2004 to the [facility], whose doctor is  Dr. Eliezer Lofts, who had a sickle cell pain crisis that was not controlled  with his home medicines of two Percocet.  The patient was admitted for IV  fluid hydration and pain management, as well as chest x-ray and cultures to  be drawn.   PROBLEMS AT DISCHARGE:  Sickle cell pain crisis.  The patient was admitted  and given IV fluid hydration.  Blood cultures were drawn and were no growth  to date x2 on preliminary report.  The patient was given Dilaudid PCA with a  basal rate of 1 mg/hr and was continued on the Dilaudid for one day;  however, this gave the patient itching and headache.  The patient decided  that he did not want this pain medicine and was put on  scheduled Toradol of  30 mg IV beginning on September 27, 2004 and was switched to p.o. Percocet on  September 28, 2004.  The patient's pain crisis continued to be under control  and it was noted that he did not need much pain medicine and only required  one and was now ready for discharge on September 29, 2004 with very minimal  pain located in the back.  He thinks he can control this at his home dose.  The patient will be sent home with oxycodone 5 mg every six hours for pain  control, as well as Toradol 10 mg p.o. to continue for one day since the  patient was on scheduled Toradol and may need some for breakthrough on day  of discharge.  The patient was instructed to use the Toradol with food and  only as needed for one day.  The patient was compliant  and agreed with this  plan and is stable and now ready for discharge.   DISCHARGE PHYSICAL EXAMINATION:  GENERAL:  The patient is a general African-  American male in no apparent distress.  CARDIOVASCULAR:  Regular rate and rhythm.  No murmurs, rubs, or gallops.  LUNGS:  Clear to auscultation bilaterally.  ABDOMEN:  Soft, nontender, nondistended, thin.  EXTREMITIES:  Thin, no clubbing, no cyanosis, no edema and pain was well  controlled and down to a 1/10 on date of discharge.   The patient is to follow up with Dr. Eliezer Lofts at the [facility] clinic.  The patient's appointment was called in by Dr. Servando Snare, however, it was not returned on time for dictation.  The patient  was instructed to call to verify his appointment or to schedule as need in  one to two weeks status post hospitalization.      Servando Snare, M.D.    ______________________________  Jamal Collin. Andria Frames, M.D.    MB/MEDQ  D:  09/29/2004  T:  09/29/2004  Job:  [id number]   cc:   Eliezer Lofts, MD  Fax: [phone]

## 2010-07-01 NOTE — H&P (Signed)
NAME:  [name] [name], [name] [name]                      ACCOUNT NO.:  [id number]   MEDICAL RECORD NO.:  [id number]                   PATIENT TYPE:  INP   LOCATION:  1831                                 FACILITY:  [facility]   PHYSICIAN:  Tonia Brooms, M.D.                   DATE OF BIRTH:  1980/03/29   DATE OF ADMISSION:  11/02/2001  DATE OF DISCHARGE:                                HISTORY & PHYSICAL   CHIEF COMPLAINT:  Chest pain.   HISTORY OF PRESENT ILLNESS:  This is an 30 year old boy with a known  hemoglobin SS sickle cell disease, who began experiencing back pain one week  prior to presentation.  The patient feels that he overexerted himself at  work and had some pain beginning in his back.  He at first attempted to  control the pain with over the counter medications, however, it just seemed  to get worse and on October 30, 2001, the patient presented to the family  practice center with the pain.  He did get some oral Percocet at that time,  but the pain continued.  It moved around into his chest.  He represented to  the family practice center on November 01, 2001, secondary to chest pain,  as well as some vomiting.  He was given a Toradol shot in the office plus  Percocet.  A chest x-ray, PA and lateral, was done, which was found to be  negative.  His pain continued through the night.  As this did not resolve,  it was decided to come to the emergency department.   REVIEW OF SYMPTOMS:  No fever.  No chills.  No cough.  No recent illness or  URI-type symptoms.   PAST MEDICAL HISTORY:  1. Sickle cell disease.  Baseline hemoglobin 9.0, hemoglobin SS.  He has a     history of acute chest a few years ago, per mom.  Last hospitalized for a     pain crisis in April of 2002.  He sees Dr. Jacqualin Combes at [facility], sickle cell, every three months for his pain.  2. History of elevated liver enzymes.  A hepatitis panel was done in April     of 2002, which was negative.  History  of an abdominal ultrasound in May     of 2002 which showed gallbladder sludge.   MEDICATIONS:  1. Colace p.r.n.  2. Hydroxyurea 500 mg t.i.d.  3. Vasotec 5 mg one-and-a-half tablets p.o. q.d.  4. Tylenol with codeine p.r.n.  5. Colace p.r.n.   ALLERGIES:  The patient is allergic to [facility].   SOCIAL HISTORY:  The patient lives with his parents and sister.  There are  no smokers in the house.  He has a part-time job at the [employer].   FAMILY HISTORY:  Positive for sickle cell trait in his mother and father, as  well as a  sister, otherwise is negative.   PHYSICAL EXAMINATION:  VITAL SIGNS:  Temperature 98.6 degrees, blood  pressure 115/62, respiratory rate 16, saturating 92% on room air.  GENERAL APPEARANCE:  This is a pleasant African American boy, who is not  particularly talkative and appears to be in mild pain, particularly when  moves.  Mom provides most of the history.  HEENT:  There is bilateral scleral icterus.  No thyromegaly.  CARDIOVASCULAR:  Regular rate and rhythm with a 2/6 systolic ejection  murmur.  There is a laterally displaced PMI with a hyperdynamic precordium.  PULMONARY:  Clear with good air exchange bilaterally.  ABDOMEN:  Nontender and nondistended.  No organomegaly.  The liver is not  palpable.  DERMATOLOGIC:  There are no rashes.  Jaundice is not particularly noticeable  in the skin.  NEUROLOGIC:  Grossly intact.  EXTREMITIES:  No peripheral edema.   LABORATORY DATA:  Sodium 131, potassium 4.7, chloride 103, bicarbonate 21,  BUN 12, creatinine 0.9, glucose 133, albumin 3.9, AST 70, ALT 102, alkaline  phosphatase 192, bilirubin 4.7.  Reticulocyte count 6.3.  UA negative.  White count 14.6 with 67% neutrophils and 23% lymphocytes.  The hemoglobin  is 9.7, hematocrit 27, and platelets 259.  The peripheral smear shows Tammy Sours bodies.  On chest x-ray there appears to be a new left lower lobe  infiltrate versus atelectasis.   IMPRESSION:  This  is an 30 year old African American boy with a known  hemoglobin SS disease who has had one week of progressive pain in the back  and now his chest.  He has been seen in the [facility] two times over the past  three days and tried to control his pain without success.  The chest x-ray  was clear yesterday, but may have a left lower lobe infiltrate today.  No  cough.  No fever.  No other signs of pneumonia or acute tests.   ASSESSMENT AND PLAN:  1. Sickle cell pain crisis, rule out acute chest.  Will start a morphine     PCA, low dose.  Will start oxygen by nasal cannula and IV fluids.  Repeat     a chest x-ray in the a.m.  Will start Cefotan 1 g q.12h. IV, as well as     erythromycin 500 mg q.i.d. for possible infection.  Will follow     hemoglobin and bilirubin.  2. Increased liver function tests.  This is apparently a chronic problem     documented in April of 2002 with a work-up negative for hepatitis.  Will     contact the primary M.D., Raeanne Gathers, M.D., regarding to see if known     etiology.  Will follow in house.  3. History of constipation..  Will start Colace.                                               Tonia Brooms, M.D.    Veatrice Bourbon  D:  11/02/2001  T:  11/05/2001  Job:  [id number]   cc:   Raeanne Gathers, M.D.  [facility]  [place], [place] [address]  Fax: [phone]

## 2010-07-01 NOTE — H&P (Signed)
[name], [name] NO.:  [id number]   MEDICAL RECORD NO.:  [id number]          PATIENT TYPE:  INP   LOCATION:  1827                         FACILITY:  [facility]   PHYSICIAN:  Sallyanne Havers, M.D.    DATE OF BIRTH:  08/16/1980   DATE OF ADMISSION:  01/20/2004  DATE OF DISCHARGE:                                HISTORY & PHYSICAL   PRIMARY CARE PHYSICIAN:  Dr. Eliezer Lofts.   CHIEF COMPLAINT:  Chest and back pain.   HISTORY OF PRESENT ILLNESS:  This 30 year old African American with sickle  cell anemia presented to [facility] Emergency Department with a complaint of  left chest, upper and mid-back pain last night, but was discharged home and  returns now, still in pain, so I was called to admit.  His pain began  yesterday while lying in bed.  He denies any recent strenuous activity or  travel.  He took Percocet, but that did not help the pain.  He was given  Dilaudid 2 mg since he has been here in the emergency department.   REVIEW OF SYSTEMS:  Review of systems positive for fever x1 day with a  temperature to 101 degrees, also chest pain that is worse with deep  inspiration and back pain, negative for cough, shortness of breath, rashes,  abdominal pain, sore throat or dysuria.   PAST MEDICAL HISTORY:  1.  Sickle cell anemia.  2.  History of acute chest syndrome.  3.  History of priapism.  4.  Genital warts.   MEDICATIONS:  1.  Hydroxyurea 1500 mg daily.  2.  Percocet p.r.n.  3.  Vasotec 7.5 mg daily.   ALLERGIES:  MANDOL.   FAMILY HISTORY:  The patient's mother, father and sister have sickle cell  trait.   SOCIAL HISTORY:  The patient lives in [place] with his mother and sister.  He denies tobacco use or any other drug use.  He drinks alcohol  occasionally.   PHYSICAL EXAM:  VITAL SIGNS:  Temperature 101.3, pulse 106, blood pressure  126/70, respirations 20, 95% on room air.  GENERAL:  The patient is drowsy during the interview, but oriented and in  no  acute distress.  HEENT:  Pupils are equal, round and reactive to light.  Extraocular  movements intact.  Oropharynx clear.  NECK:  Supple.  No lymphadenopathy, no meningeal signs.  HEART:  Regular rate and rhythm without murmurs, rubs, or gallops.  CHEST:  Lungs clear to auscultation bilaterally without wheezes, rales or  rhonchi and his left anterior chest is tender to palpation.  The patient has  very poor respiratory effort.  ABDOMEN:  Abdomen soft, nontender and non-distended.  Normoactive bowel  sounds.  No hepatosplenomegaly appreciated.  EXTREMITIES:  Mild clubbing bilaterally.  No cyanosis and no edema.  SKIN:  No rashes or lesions noted.  NEUROLOGIC:  Cranial nerves II-XII grossly intact.  Nonfocal exam.   LABORATORY DATA:  White blood count 12.1, hemoglobin 10.1, hematocrit 28.2,  platelets 283,000, absolute neutrophil count 9.0.  Sodium 137, potassium  4.3, chloride 112, bicarb 20, BUN 10, creatinine 0.9, glucose 93, calcium  9.0.   Chest x-ray from prior ER visit last night shows cardiomegaly, increased  pulmonary vascularity and chronic interstitial changes as well as  questionable mild airspace disease/pneumonia in left lower lobe.   ASSESSMENT AND PLAN:  [age]-year-old black male with sickle cell pain  crisis.   1.  Pain crisis -- worrisome for acute chest syndrome since the patient is      febrile, has leukocytosis and questionable pneumonia on chest x-ray.      Will start gentle hydration with intravenous fluids and check a blood      culture as well as urinalysis for other sources of fever.  Will start      broad-spectrum antibiotic to treat aggressively, since there is high      mortality secondary to acute chest syndrome.  We will also start      Dilaudid patient-controlled analgesic for pain control.  2.  Sickle cell anemia -- hemoglobin is stable at the patient's baseline and      will continue the hydroxyurea.  The patient did not follow up with  his      referral to hematology-oncology, so need to stress the importance to him      of followup.       AS/MEDQ  D:  01/20/2004  T:  01/20/2004  Job:  [id number]   cc:   Eliezer Lofts, MD  Fax: [phone]

## 2010-07-01 NOTE — Discharge Summary (Signed)
NAMEALDRIC, [name] [name] NO.:  [id number]   MEDICAL RECORD NO.:  [id number]          PATIENT TYPE:  INP   LOCATION:  5006                         FACILITY:  [name]   PHYSICIAN:  Arnette Norris, M.D.       DATE OF BIRTH:  07-26-1980   DATE OF ADMISSION:  11/08/2005  DATE OF DISCHARGE:  11/14/2005                                 DISCHARGE SUMMARY   CONSULTATIONS:  Dr. Alen Blew and Dr. Beryle Beams in Hematology.   REASON FOR ADMISSION:  He was admitted for sickle cell pain crisis.   DISCHARGE DIAGNOSIS:  1. Sickle cell disease with pain crisis.  2. Acute chest syndrome pneumonia.  3. Constipation.   DISCHARGE MEDICATIONS:  1. Hydroxyurea 15 mg p.o. daily.  2. Avalox 400 mg daily x14 days.  3. Oxycodone 5 mg q.6 hours p.r.n. pain.   DISPOSITION:  Patient is going to be transferred home.  He is stable.   DISCHARGE FOLLOWUP:  Patient is to followup with Dr. Birdie Riddle on November 24, 2005 at 9:45 p.m.  He is also to followup with his hematologist at [name].  His mother states that she will make the  appointment herself.   FOLLOWUP ISSUES:  Patient will need a chest x-ray in 4-6 weeks to followup  his pneumonia.  Patient will also need to followup with his PCP and his  hematologist at [name].   DISCHARGE CONDITION:  Patient is stable, he has been afebrile, and his pain  has been well controlled at discharge.   DISCHARGE LABS:  Hemoglobin is 8.7, hematocrit is 25.4, white count is 6.3,  platelets 196, sodium 140, potassium 4.3, chloride of 110, bi carb of 23,  BUN of 9.0, creatinine is 0.9, glucose of 95.  Chest x-ray from November 13, 2005 showed some interval improvement in bilateral airspace disease which  could reflect improving pneumonia.   [name] COURSE:  1. Sickle cell disease with vaso-occlusive crisis:  Patient was admitted      on November 09, 2005 with pain in his lower extremities, without      edema, trauma in his lower extremities.   He also had some pain in his      lower back which appeared to be more muscular.  At admission, he was      afebrile and his chest x-ray was negative.  We had admitted him and      decided to hydrate him, and started him on Dilaudid PCA and Toradol for      his pain.  He was doing well until the morning of November 10, 2005      when he developed progressive hypoxia.  His oxygen saturation dropped      to 60% on room air, along with worsening tachycardia.  He also spiked a      temperature of 102.  Rapid response team was called and he was started      on 100% non re breather.  His blood gases were obtained which showed a      PO2 of 131, and PCO2 of 42, PH was  7.394, and HCO3 of 22.6.  A repeat      chest x-ray at that time showed predominantly interstitial pattern of      infiltrate bilaterally.  This was consistent with his mild anemia as      well.  He was then typed and crossed, and transfused with 2 units of      packed red blood cells.  We were able to wean down his oxygen      requirement to 4L nasal canula which he was saturating above 94%.  He      was also started on IV antibiotics at that time as we were worried      about his acute chest.  He was started on ceftriaxone and azithromycin.      Patient reported at that time that his pain was slightly improving.      His breathing also improved after receiving a transfusion.  The patient      was then transferred and ICU step down but the monitors were closely.      Chest x-ray on the morning of November 11, 2005, showed worsening of      his bilateral pulmonary densities which were likely representative of      pneumonia.  We appreciate the input from hematology and believe that      this pneumonia was likely viral micro plasma or the beginning of acute      chest syndrome.  We also decreased his IV fluids at that time to 75      mils per hour as we did not want to fluid overload him.  We continued      him on IV Dilaudid PCA  at that time for pain.  His mother wanted him to      be transferred to [name] but on the evening of September 30th, she called      me to the room and stated that she was receiving excellent care here,      and TJ was getting better so she did not want a transfer at that time.      We then ordered a chest x-ray at that time which showed some      interstitial improving in his bilateral air space disease which      reflected improving pneumonia.  He was also able to be weaned off of      his oxygen on October 1st and was setting 90-96% at discharge.  His      hemoglobin at discharge was also 8.7, his vital signs were stable, and      he had remained afebrile since the 1 spike on September 29th.  On the      day of discharge, we dc 'd his Dilaudid PCA as his pain was reportedly      a 2-3 out of 10.  We then transitioned him to oral oxycodone from his      home regimen which was 5 mg q.6 hours p.r.n. pain.  He tolerated this      well.  2. Sickle cell anemia:  His hemoglobin status post transfusion was stable      and was 8.7 at discharge.           ______________________________  Arnette Norris, M.D.     TA/MEDQ  D:  11/14/2005  T:  11/14/2005  Job:  [id number]

## 2010-07-01 NOTE — Consult Note (Signed)
NAMEJOHNTAVIS, [name] [name]            ACCOUNT NO.:  [id number]   MEDICAL RECORD NO.:  [id number]          PATIENT TYPE:  INP   LOCATION:  5731                         FACILITY:  [facility]   PHYSICIAN:  Mathis Dad. Shadad        DATE OF BIRTH:  08-May-1980   DATE OF CONSULTATION:  11/10/2005  DATE OF DISCHARGE:                                   CONSULTATION   REASON FOR CONSULTATION:  Sickle cell disease.  Evaluate for acute chest  syndrome.   HISTORY OF PRESENT ILLNESS:  This is a very pleasant 30 year old African-  American gentleman with a history of SS disease.  He is a native of  [place], [place] where he was living with his mother for a long  time.  His care was under [facility] sickle cell clinic, and  the last few years he has relocated and currently lives in [place] with  his mother and receives care at the sickle cell clinic at [facility].  He does have a history of recurrent acute chest syndrome as  a child, and he has required exchange transfusion and ventilation one time.  That was a remote history.  He has had multiple admissions for acute pain,  the last of which was on September 16, 2005.  The patient was admitted on the  27th of September for sickle pain crisis.  At that time, he had presented  with bilateral leg pain and lower back pain for the last 2-3 days.  He also  had a complaint of some productive cough.  He was initially seen in the  clinic and subsequently was admitted to [facility].  The patient  was treated initially with Dilaudid PCA and was later on Toradol.  He  received a lot of hydration, including bolus fluids in the emergency  department as well as aggressive hydration with normal saline 150 mL/hr.  The patient was doing fairly well until earlier today when he developed  progressive hypoxia and O2 saturation of 60% on room air as well as some  worsening tachycardia.  He was also febrile at 100.2.  The rapid  response  team evaluated the patient.  He was started on 100% nonrebreather, and a  blood gas was obtained.  His blood gases on 100% oxygen showed a pO2 of 131,  pCO2 42, pH 7.349, HCO3 22.6.  A chest x-ray was obtained which showed a  predominantly interstitial pattern of infiltrate bilaterally.  Right was  more than the left, more consistent with mild anemia.  It was compared to a  chest x-ray on the 27th which showed cardiomegaly without congestive heart  failure, some chronic lung changes but no acute process.  He also had  multiple chest x-rays in the past that all showed cardiomegaly an increased  interstitial pattern disease.  However, this one was slightly worse.  The  patient again was titrated down on oxygen from a nonrebreather to a nasal  cannula at 4 liters with oxygen saturation of 94%.  The patient was also  started on intravenous antibiotic utilizing ceftriaxone and  azithromycin.  He also is receiving 2 units of packed red cell transfusion.  Upon  interviewing [name], his mother was present at that time, and the  history was given predominantly by him and his mother.  He reported that he  was feeling relatively fine.  He was still reporting some pain in his back  but really no severe chest pain that he could appreciate.  He reported some  slight dyspnea but otherwise really no other major complaints at this point.   REVIEW OF SYSTEMS:  He did not report any headaches, blurred vision, double  vision, motor or sensory neuropathy, alteration of mental status, or  psychiatric issues.  He did report fevers but no chills.  He did report a  nonproductive cough.  He did not report any chills, night sweats, weight or  appetite changes.  He did not report any severe chest pain.  Some mild  discomfort but no orthopnea or PND.  He does report some leg edema.  He does  report some cough and some shortness of breath as well as tachypnea and  tachycardia.  He did not report any  nausea or vomiting.  He did report some  abdominal fullness but no change in his bowel habits.  He did not report any  hematochezia or melena.  He did not report any hematuria, hesitancy,  frequency or urgency.  He did not report any heat or cold intolerance.  He  did not report any bleeding, clotting or diathesis.  The rest of the review  of systems was unremarkable.   PAST MEDICAL HISTORY:  Significant for sickle cell disease as mentioned  above.  He also has had multiple veno-occlusive crisis and pain admissions,  also a history of acute chest syndrome in the past  and required an exchange  transfusion as a child.  Again, that was done at [facility].  He also has a history of proteinuria.  For that, he is on Vasotec but  otherwise no other medical history.   ALLERGIES:  MANDOL.  MORPHINE CAUSES HEADACHES.   MEDICATIONS AT HOME:  1. He takes hydroxyurea 1500 mg daily.  2. Vasotec 7.5 mg daily.  3. Oxycodone 5 mg q.6 h. p.r.n.  4. Toradol 10 mg q.6 h. p.r.n.   HOSPITAL MEDICATIONS:  1. As mentioned, he was initially on Dilaudid PCA and was also receiving      Toradol.  2. He was also started on azithromycin as well as ceftriaxone for IV      antibiotics.   FAMILY HISTORY:  Sickle cell trait in mother, father and sister.  No other  history of blood disorders or any malignancies.   SOCIAL HISTORY:  He is unemployed, lives with his mother.  No smoking or  alcohol at this point.   PHYSICAL EXAMINATION:  GENERAL:  An alert, awake, pleasant gentleman who did  not appear in any distress.  He is mentating properly without any hindrance  or decline at this point.  VITAL SIGNS:  Blood pressure 120/73, pulse 112, temperature 98.3, maximum  temperature 100.2, O2 saturation 94% on 4 liters.  HEENT:  Head normocephalic and atraumatic.  Pupils are equal, round and  reactive to light.  Sclerae icteric.  NECK:  Supple.  No lymphadenopathy. HEART:  Regular rate and rhythm.  S1  and S2.  LUNGS:  He had scattered rhonchi, otherwise clear.  ABDOMEN:  Soft, slightly distended.  EXTREMITIES:  Some slight edema.  NEUROLOGIC:  He was intact.  SKIN:  No rashes or lesions.   LABORATORIES:  Blood count showed a hemoglobin of 7.9, white count 12.2,  platelet count 146.  Creatinine 1.2, potassium 5.1.  Smear showed positive  sickle cells and polychromasia.  His white cells and platelets appeared  normal.  Chest x-ray, as mentioned, has interstitial infiltrate, right more  than left.   ASSESSMENT/PLAN:  1. This is a 30 year old gentleman with sickle cell disease, history of      multiple pain crises as well as acute chest syndrome.  He is admitted      with another pain crisis and has now developed a low-grade fever,      tachypnea and an interstitial infiltrate and some hypoxia but without      difficulty with oxygenation.  1.  The differential diagnosis would      include certainly early acute chest syndrome, given his history of      acute chest syndrome in the past, putting him at a high risk.      Certainly that would be a possibility, however, it does not appear to      be severe at this point.  2.  Possibly, this could be an infectious      process given his interstitial pattern.  A viral versus atypical      bacterial infection would be a possibility.  3.  Thirdly, volume      overload.  He does have cardiomegaly, and maybe he has an element of      cardiomyopathy, and certainly fluid overload could contribute to that      given his cardiomyopathy.   RECOMMENDATIONS:  My recommendation would be as follows:  1. I do not see an urgent need for exchange transfusion.  I do agree with      a transfusion of packed red blood cells to improve oxygenation.  I      agree with broad-spectrum antibiotics with ceftriaxone and azithromycin      to cover atypical bacteria.  I would definitely cut down on IV fluids.      He does have symptoms of fluid overload as evidenced by  his abdominal      distension and his lower extremity edema.  The family desires a      transfer to [facility], and I certainly do not      disagree with that given his established relationship at the sickle      cell clinic over there.  2. I would watch him closely, and certainly a step-down transfer would be      a reasonable approach.  I would watch his oxygenation very closely, and      if he becomes difficult to oxygenate, then consideration will be given      for an exchange transfusion.   We will continue to follow with you for [name]'s care.           ______________________________  Mathis Dad. [facility]  Electronically Signed     FNS/MEDQ  D:  11/10/2005  T:  11/10/2005  Job:  [id number]   cc:   Billey Chang, M.D.

## 2010-07-01 NOTE — Discharge Summary (Signed)
[place]  Patient:    [name], [name]                   MRN: [id number] Adm. Date:  [id number] Disc. Date: [id number] Attending:  Leamon Arnt Dictator:   Joretta Bachelor, M.D. CC:         Raeanne Gathers, M.D.   Discharge Summary  DISCHARGE DIAGNOSES: 1. Sickle cell disease with pain crisis with ankle pain and priapism. 2. Constipation. 3. Proteinuria.  DISCHARGE MEDICATIONS: 1. Hydroxyurea 750 mg p.o. q.d. 2. Folic acid 1 mg p.o. q.d. 3. Vasotec 2.5 mg p.o. q.d. 4. Colace. 5. Tylenol with codeine.  ALLERGY:  MANDOL.  FOLLOWUP:  Follow up at [facility] either on Monday or Tuesday of this coming week.  DISCHARGE LABORATORY DATA AND X-RAY FINDINGS:  The patient was discharged with a hemoglobin of 6.8.  Reticulocyte count 7.6, RBC 1.88, ABS 1.43.  White count 14.9 with differential of 64% lymphs, 28% neutrophils, hemoglobin 7.7.  Sodium 137, potassium 5.2, chloride 111, bicarb 20, BUN 16, creatinine 1, glucose 94. Total bilirubin 6, Alk phos 212, SGOT 79, SGPT 30, total protein 6.9, albumin 4.1, calcium 9.3.  HISTORY OF PRESENT ILLNESS:  This is a 30 year old, African-American male with sickle cell disease who presented to the [facility] to the walk-in clinic with a two-day history of right ankle pain.  He had been taking Motrin twice a day for his pain and he also stated that in addition to his ankle pain, he had a five-day history of intermittently painful erection. The patient had seen Dr. Alyson Ingles 28 days previously and had been given Sudafed apparently and had some relief with this.  The patients last hospital admission was in April 2002, at which time he was admitted for a pain crisis and during that admission had a hemoglobin that ranged between 7.1 and 7.7.  PHYSICAL EXAMINATION:  VITAL SIGNS:  Stable and afebrile.  GENERAL:  Well-developed, well-nourished male in no acute  distress.  CARDIOVASCULAR:  Regular rate and rhythm with 2/6 systolic murmur, best auscultated over the aortic area.  ABDOMEN:  Clear to auscultation bilaterally.  GENITALIA:  Penis apparently did appear to be engorged.  Bilateral corpora cavernosus seemed to be engorged, however, this seemed to be sparing of the corpora bulbus.  EXTREMITIES:  No clubbing, cyanosis, or edema.  Range of motion is normal. All major joints and right ankle had no swelling or erythema.  LABORATORY DATA AND X-RAY FINDINGS:  A fingerstick hemoglobin was done in the [facility] which was shown to be 5.1.  ASSESSMENT/PLAN:  The patient had a very low hemoglobin and what appeared to be painful priapism.  The thought was he was likely having an impending pain crisis.  He was therefore admitted to [facility] inpatient service.  HOSPITAL COURSE:  The patient was initially admitted to the [facility] inpatient and a recheck of his hemoglobin in the hospital showed him to have a hemoglobin of 7.7 which was actually much better than what had been at the [facility] and the patients symptoms actually resolved quickly over the course of the evening.  It seemed to resolve with hydration. Therefore, it was felt that likely the patients outpatient labs were low and he was probably at about his baseline hemoglobin as he did not appear to have any increase in his pain and actually had resolution of his  pain.  It was felt likely he had been in the beginning of a crisis, but this had been headed off. He was watched overnight and seemed to do quite well.  He was therefore discharged to home with follow up to [facility]. DD:  08/04/00 TD:  08/06/00 Job: [id number]

## 2010-07-01 NOTE — H&P (Signed)
NAME:  [name] [name], [name] [name]                      ACCOUNT NO.:  [id number]   MEDICAL RECORD NO.:  [id number]                   PATIENT TYPE:  OBV   LOCATION:  3039                                 FACILITY:  [facility]   PHYSICIAN:  Sallyanne Havers, M.D.                 DATE OF BIRTH:  Apr 11, 1980   DATE OF ADMISSION:  09/14/2002  DATE OF DISCHARGE:                                HISTORY & PHYSICAL   PRIMARY CARE PHYSICIAN:  Dr. Eliezer Lofts.   CHIEF COMPLAINT:  Low back pain.   HISTORY OF PRESENT ILLNESS:  This 30 year old, African-American male, with  sickle cell disease, type SS, presents to the [facility] Emergency Room with  complaints of low back pain that began today at 1400.  This pain is similar  to his last pain crisis in February 2004, for which he was hospitalized.  The pain is sharp, 9/10, and radiates down both lower extremities, and he  complains of difficulty ambulating secondary to the pain.  Patient went  swimming today prior to the pain starting.  He has not tried any today.  He  denies fever or chills, or nausea or vomiting.   REVIEW OF SYSTEMS:  There is pain.  No cough.  No shortness of breath.  No  diarrhea.  No headache or dizziness.  No new rashes or bruises.  No blurry  vision.  No dysuria.  No sore throat.   PAST MEDICAL HISTORY:  1. Sickle cell disease, type SS.  2. History of acute chest and priapism at 30 years of age.   MEDICATIONS:  1. Hydroxyurea 500 mg 3 tabs daily.  2. Vasotec 7.5 mg daily.   ALLERGIES:  MANDOL - hives.   PROCEDURE:  Abdominal ultrasound in May 2002 showing gallbladder sludge and  gallbladder polyp.   FAMILY HISTORY:  Sickle cell trait in mother and father and sister.   SOCIAL HISTORY:  Patient lives with his girlfriend in an apartment.  He is a  nonsmoker.  He did not attend college and works at [employer], and denies  any drug use.   PHYSICAL EXAMINATION:  VITAL SIGNS:  Temperature 97.8, pulse 79,  respirations 8, 95% on  room air.  GENERAL:  Patient is sleepy after given morphine but appears in no acute  distress and is alert and oriented x 3.  HEENT:  Positive for icteric sclerae.  No pallor.  PERLA.  EOMI.  Nares with  pink turbinates.  Oropharynx clear.  Moist mucus membranes.  NECK:  Supple.  No lymphadenopathy.  No thyromegaly.  HEART:  Regular rate and rhythm with [id number] systolic murmur, best heard at  left upper sternal border.  LUNGS:  Clear to auscultation with no crackles or rhonchi and good air  movement.  ABDOMEN:  Soft, nontender, nondistended.  Normoactive bowel sounds.  Liver  tip 2 cm below the costal margin.  EXTREMITIES:  Positive for clubbing.  No  cyanosis.  No edema.  SKIN:  Without rashes or lesions.  NEUROLOGIC:  Cranial nerves II-XII grossly intact.  Random alternating  movements intact.  Deep tendon reflexes 2+ bilaterally.   LABORATORY DATA:  Sodium 136, potassium 3.9, chloride 110, bicarb 21, BUN  12, creatinine 0.8, glucose 105, calcium 8.7.  Total bilirubin 4.1, alk phos  116, AST 38, ALT 18, total protein 6.6, albumin 4.3.  White blood count  14.1, hemoglobin 9.2, hematocrit 25.6, platelets 304, MCV 120, ANCA 8.8, LDH  395, reticulocyte count 7.9%.   Urinalysis within normal limits.   ASSESSMENT/PLAN:  A 30 year old, African-American male with SS sickle cell  disease with one day of severe low back pain and jaundice.   1. Sickle cell pain crisis - most likely precipitated by swimming     (dehydration from exertion versus cold water).  Given morphine in the     emergency department with some improvement.  No signs or symptoms of     infection or acute chest syndrome.  We will start Dilaudid PCA and     ibuprofen for optimal pain control as well as oxygen as needed, and     intravenous fluids to rehydrate.  Hemoglobin is now at baseline, between     9-10, so no transfusion needed.   1. Hyperbilirubinemia - labs consistent with hemolysis from Vaso-occlusive     pain  crisis.  We will treat #1 and monitor a CBC.  Pain crisis may also     explain macrocytosis.   1. Disposition - when pain decreases to 4/10, we will change to low-dose     Dilaudid PCA, then wean to off when patient tolerates and change to p.o.     medications prior to discharge home.                                                   Sallyanne Havers, M.D.    AS/MEDQ  D:  09/15/2002  T:  09/15/2002  Job:  [id number]   cc:   Eliezer Lofts, M.D.  [place]

## 2010-07-01 NOTE — H&P (Signed)
[place]  Patient:    [name] [name], [name] [name]                   MRN: [id number] Adm. Date:  [id number] Attending:  Leamon Arnt Dictator:   Cleopatra Cedar, M.D.                         History and Physical  CHIEF COMPLAINT:  Right ankle pain and priapism.  HISTORY OF PRESENT ILLNESS:  The patient is a 30 year old African-American male with a past medical history significant for sickle cell disease, who presented to the walk-in clinic at the [facility] with a two-day history of right ankle pain.  He has taken Motrin twice a day with some relief.  He also has put some ice on it which seemed to have helped.  The patient also tells me that he has a five-day history of a painful erection off and on.  The patient saw Dr. Alyson Ingles four days ago and has been given Sudafed.  This has offered him some relief.  REVIEW OF SYSTEMS:  CONSTITUTIONAL:  No fevers, no chills.  CARDIOVASCULAR: No history of chest pain.  RESPIRATORY:  No shortness of breath, no cough. GASTROINTESTINAL:  Positive for epigastric pain.  SKIN:  No rashes. NEUROLOGIC:  No headaches.  PSYCHIATRIC:  Noncontributory.  MUSCULOSKELETAL: Right ankle pain.  EYES:  No visual changes.  ENT:  Noncontributory. GENITOURINARY:  Positive priapism, _________ .  HEMATOLOGIC:  History of sickle cell disease, no crisis for "years."  PAST MEDICAL HISTORY:  Significant for sickle cell crisis, constipation, proteinuria.  MEDICATIONS:  Colace, folic acid, Tylenol with codeine, Vasotec, as well as hydroxyurea 750 mg p.o. q.d.  ALLERGIES:  MANDOL.  OTHER PAST MEDICAL HISTORY:  History of acute chest pain and priapism based on hemoglobin of 9.0, elevated liver enzymes in April of 2002.  SOCIAL HISTORY:  Recently moved from [place], [place].  Lives with father, mother, sister, and 33-month-old niece.  No smokers.  Attends 11th grade at [employer]  He denies any  illicit drug use.  PROCEDURES/SURGICAL HISTORY:  Abdominal ultrasound, gallbladder/gallbladder polyp in May of 2002.  Other doctors:  Dr. Jacqualin Combes, Hem/Onc at [facility].  FAMILY HISTORY:  Sickle cell trait, mother, father, and sister, otherwise negative.  PHYSICAL EXAMINATION:  VITAL SIGNS:  Stable, afebrile.  GENERAL:  Well-developed, well-nourished, 30 year old African-American male, quiet, in no apparent distress.  HEENT:  Normocephalic, atraumatic.  Pupils equal, round, and reactive to light and accommodation.  Extraocular motion intact.  Of note is that the patient has bilateral conjunctival icterus, which is marked.  Ears, nose, and throat: Grossly within normal limits.  NECK:  No lymphadenopathy.  CARDIOVASCULAR:  Regular rate and rhythm with a 2/6 systolic murmur, best appreciated over the aortic area, second intercostal space on the left.  RESPIRATORY:  Clear to auscultation.  ABDOMEN:  Soft, nondistended, with mild epigastric tenderness, normal active bowel sounds.  EXTREMITIES:  No clubbing, cyanosis, or edema.  The range of motion is normal over all major joints.  His right ankle is inspected.  There is no swelling, no warmth.  NEUROLOGIC:  Cranial nerves 2-12 grossly intact.  No focal, sensory, or motor deficits.  GENITOURINARY:  The penis is inspected.  It does appear to be engorged. Bilateral corpora cavernosus seem to be engorged, sparing of the corporum bulbosum which is soft.  LABORATORY:  A fingerstick hemoglobin here in the  office was 5.1.  Other labs are pending.  ASSESSMENT:  A 30 year old African-American male with sickle cell disease and what appears to be now priapism.  Hemoglobin here in the clinic is very low at 5.1, suggesting impending crisis.  PLAN: 1. Admit to [facility] service. 2. Hydration, pain control, transfusion, and O2. 3. Consider urology consult for priapism.  The patient was also seen by    attending physician, Dr. Azzie Roup. DD:  08/03/00 TD:  08/03/00 Job: [id number]

## 2010-07-01 NOTE — Discharge Summary (Signed)
[name], [name]            ACCOUNT NO.:  [id number]   MEDICAL RECORD NO.:  [id number]          PATIENT TYPE:  INP   LOCATION:  4736                         FACILITY:  [facility]   PHYSICIAN:  Shaw                   DATE OF BIRTH:  May 04, 1980   DATE OF ADMISSION:  DATE OF DISCHARGE:                               DISCHARGE SUMMARY   DISCHARGE DIAGNOSES:  1. Sickle cell pain crisis.  2. Sinus arrhythmia with pitting discharge   MEDICATIONS:  1. Hydroxyurea 500 mg 3 tabs p.o. every day.  2. Percocet 5/325, 1-2 tabs p.o. q. 6 hours p.r.n. pain (#30 given, no      refills).  3. Vasotec 7.5 mg 1 tab p.o. every day.  4. Colace 100 mg p.o. every day.   No prescriptions for Percocet and Colace given.   FOLLOWUP:  Patient is to follow up with Dr. Thornell Sartorius next week at [facility].  The patient will call for appointment.   DIAGNOSTIC STUDIES:  EKG showing a rate of 66 beats per minute, with  sinus arrhythmia on intake.  This was repeated the next morning, again  with a rate in the 60s, no ST changes, sinus arrhythmia noted.   CONSULTANTS:  None.   ADMISSION H&P:  The patient was a 30 year old African American male with  a history of sickle cell disease, who presented with pain.  He had been  drinking alcohol on New Year's Eve, and after he stopped drinking began  having neck, left chest, and bilateral knee and calf pain.  This is all  very similar to his previous sickle cell pain crises.  He described the  neck pain as throbbing, but is able to move his neck freely.  He had had  no fevers, no chills, and no rhinorrhea.  No changes in his bowel  habits.  He had no pain medication at home.  He was admitted for  inpatient management.   HOSPITAL COURSE:  1. Sickle cell pain crisis.  He was admitted to be hydrated and placed      on a PCA, with good effect.  Was also started on Toradol.  The      patient rapidly improved.  He had no findings on exam that were  consistent with sickle cell acute chest disease.  His chest x-ray      showed coarse interstitial markings and skeletal changes consistent      with sickle cell disease, but no acute infiltrates.  By lunchtime      on February 14, 2006 he was having good pain control.  His PCA had      been discontinued, he was taking p.o., and he was appropriate for      outpatient followup.  2. Sinus arrhythmia.  Patient had sinus arrhythmia noted on intake.      Given that he is a young [place] American male, this is not      formally concerning.  At the time of discharge he was chest pain-  free, and he did not need a workup for this.   LABORATORY DATA:  Intake hemoglobin 10.1, white count 12.8, with 55%  neutrophils, and 37% lymphocytes.  Creatinine on admission 0.9,  reticulocyte count 6.1%.  Urinalysis with 30 of protein, negative for  nitrite and leukocyte esterase.  Microscopic analysis of urine shows 0-2  rbc's/hpf.      Elveria Rising Damita Dunnings, M.D.    ______________________________  Brigitte Pulse    GSD/MEDQ  D:  02/14/2006  T:  02/14/2006  Job:  [id number]   cc:   Annye Asa, M.D.
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
## 2010-07-01 NOTE — H&P (Signed)
[name] [name], [name] [name] NO.:  [id number]   MEDICAL RECORD NO.:  [id number]          PATIENT TYPE:  INP   LOCATION:  5028                         FACILITY:  [facility]   PHYSICIAN:  Talbert Cage, M.D.DATE OF BIRTH:  Jul 09, 1980   DATE OF ADMISSION:  09/16/2005  DATE OF DISCHARGE:                                HISTORY & PHYSICAL   CHIEF COMPLAINT:  Bilateral arm pain and left hip/thigh pain.   HISTORY OF PRESENT ILLNESS:  This is a 30 year old African-American male  with a history of sickle cell disease who presents with bilateral arm pain  that started about 6:45 a.m., and also left hip/thigh pain that has  progressed to involve his entire legs.  He states that this is similar to  his previous sickle cell crises.  He denies chest pain, cough, fevers,  wheezing.  He states he has some dyspnea and some nausea, but otherwise his  respiratory, GI, and the rest of his review of systems are unremarkable.  He  was given Dilaudid 9 mg and started on IV fluids without any boluses, and  also ketorolac 60 mg IM at the [facility].  He states that he is now  having pain that is a 10/10.   PAST MEDICAL HISTORY:  1.  Sickle cell disease with a history of acute chest, priapism with      baseline hemoglobin of 9 to 10, multiple hospitalizations for sickle      cell crises.  2.  Genital warts.   PAST SURGICAL HISTORY:  None.   ALLERGIES:  MANDOL, itching.   MEDICATIONS:  1.  Hydroxyurea 1500 mg daily.  2.  Vasotec 7.5 mg p.o. daily.  3.  The patient did not come in on any pain medication per history.   IMMUNIZATIONS:  The patient states that he is up-to-date and received a  Pneumococcal vaccine at last admission.   FAMILY HISTORY:  Sickle cell trait in mother, father, and sister.  The  patient is unsure of family history otherwise.   SOCIAL HISTORY:  Lives with mother, sister, and a 69-month-old niece.  No  smokers in the house.  The patient states he drinks an  occasional beer, but  otherwise denies any drug or smoking.   REVIEW OF SYSTEMS:  As per HPI.   PHYSICAL EXAMINATION:  VITAL SIGNS:  Temperature 97.6, pulse 71,  respirations 16, blood pressure 121/71, pulse ox 98% on room air.  GENERAL:  The patient is in no acute distress, resting in bed.  MENTAL STATUS:  Alert and oriented x3.  HEENT:  Head:  Normal.  Eyes:  Extraocular movements intact.  Positive  scleral icterus.  Ears:  The external exam is normal.  Mouth and throat:  Without erythema or exudate.  CHEST/LUNGS:  Clear to auscultation bilaterally.  No increased work of  breathing or wheezes, rhonchi, or rales heard.  CARDIOVASCULAR:  Regular rate and rhythm, no murmurs, rubs, or gallops.  ABDOMEN:  Soft, nontender, nondistended, no hepatosplenomegaly.  EXTREMITIES:  Warm and well perfused.  There is no tenderness to palpation  of the extremities.  He has full  range of motion of all joints.  Pulses were  2+ bilaterally, DP, PT, and radial pulses.  NEUROLOGIC:  Cranial nerves were grossly intact without cranial nerves V or  VIII tested.  Strength in all muscle groups was 5/5.  SKIN:  There were no rashes or bruising noted, adenopathy.  There was no  cervical lymphadenopathy.   LABORATORY DATA:  Labs and tests:  There were none performed at Acute Care  available.   ASSESSMENT AND PLAN:  This is a 30 year old African-American male with:  1.  Sickle cell vaso-occlusive crisis.  The patient was started on a PCA      with Dilaudid  1 mg basal rate and the full dose demand per the order sheet currently.  He  has no signs or symptoms of acute chest with a questionable history of  dyspnea.  He is saturating 98% on room air, but we will check a chest x-ray  to look for bibasilar infiltrates.  We will monitor for any changes.  He has  no fevers and no physical examination findings otherwise.  We will start  intravenous fluids at 150 cc/hour of normal saline.  We will check a CBC  with  differential and reticulocyte count.  He has a baseline of 9 to 10 of  his hemoglobin.  1.  Fluids, electrolytes, and nutrition/gastrointestinal.  We will check a      CMP, start him on regular diet.  Intravenous fluids as noted above.  2.  Scleral icterus.  The patient states he has had this his whole life and      it is not new, it is likely secondary to hemolysis.  CMP is being      checked.     ______________________________  Karlton Lemon, M.D.    ______________________________  Talbert Cage, M.D.    SH/MEDQ  D:  09/16/2005  T:  09/16/2005  Job:  [id number]

## 2010-07-01 NOTE — Discharge Summary (Signed)
[place]. [facility]  Patient:    [name] [name], [name] [name]                   MRN: [id number] Adm. Date:  [id number] Disc. Date: [id number] Attending:  Tor Netters Dictator:   Elenor Quinones, M.D. CC:         Raeanne Gathers, M.D.  Dr. Jacqualin Combes, Hematology/Oncology, [facility]   Discharge Summary  DISCHARGE DIAGNOSES: 1. Vaso-occlusive sickle cell pain crises. 2. Acute hepatic syndrome.  DISCHARGE MEDICATIONS: 1. Vasotec 5 mg 1-1/2 tabs p.o. q.d. 2. Hydroxyurea 1000 mg Monday, Wednesday and Friday, 500 mg Tuesday, Thursday,    Saturday and Sunday. 3. Folic acid 1 mg 1 tab p.o. q.d.  SUMMARY:  The patient is a 30 year old African-American male with known sickle cell disease, previously seen by Dr. Lynnell Chad at [facility], who presented on the date of admission, April 6, complaining of pain in his back and legs, which began the prior evening.  The pain was ranked 7/10 and the patient had been taking Tylenol No. 3 without relief.  The patients baseline hemoglobin is approximately 9.0 on hydroxyurea.  The patient also developed a fever of 101, but did not have any other symptoms at that time.  PAST MEDICAL HISTORY: 1. Sickle cell disease with a history of acute chest in the past as well as    priapism. 2. Cellulitis on his head, approximately four months ago which required    hospitalization and IV antibiotics. 3. History of proteinuria. 4. History of biliary sludge by ultrasound, February 2002. 5. Immune complex glomerulonephropathy. 6. Heart murmur.  SOCIAL HISTORY:  The patient has recently moved from [place], [place] with his family, which includes his mother, father, sister and niece, no smokers in the house.  The patient is in the 11th grade and was attending [employer].  The patient denies smoking, drug use but states that he has been sexually active in the past but has always worn a condom.  FAMILY HISTORY:   [place] trait in mother, father and sister, otherwise negative.  IMMUNIZATIONS:  Up-to-date and has also received pneumovax.  MEDICATIONS ON ADMISSION: 1. Tylenol No. 3. 2. Hydroxyurea 1000 mg Monday, Wednesday and Friday, 500 mg Tuesday, Thursday,    Saturday and Sunday. 3. Vasotec 7.5 mg q.d. 4. Folic acid q.d. 5. Colace. 6. Sodium bicarb.  REVIEW OF SYSTEMS:  On review of systems, the patient was positive for constipation and occasional dry cough.  Denies sore throat, dysuria or penile discharge.  ADMISSION LABORATORY DATA:  Hemoglobin 8.1, hematocrit 22.1, white blood cell count 14.8, platelets 371, 72% neutrophils, 24% lymphs, 4% monos, reticulocyte count 6.2, sodium 134, potassium 4.4, chloride 106, bicarb 21, BUN 12, creatinine 1, glucose 96, total bili 6.3, AST 74, ALT 24, alk phos 199, total protein 6.9, albumin 3.9, calcium 8.7, PT 60.5, INR 1.6, PTT 43.  UA negative, chest x-ray borderline cardiomegaly, patch airspace disease, no infiltrate noticed.  The patient was admitted to the pediatric teaching service for further observation and treatment.  HOSPITAL COURSE: #1 - HEMATOLOGY:  On admission, the patient has a hemoglobin of 8.1.  His baseline is approximately 9.0.  On the fifth day of hospitalization, the patients hemoglobin dropped to a low of 6.1 with a reticulocyte count at average from 6 to 8.  The patient was continued on his Folic acid as well as his hydroxyurea.  By the third day of his admission, we were able to discontinue  his morphine pump and he was adequately pain-controlled by Tylenol No. and Toradol.  By hospital day #4, he was only on Tylenol No. 3 p.r.n.  On the day of discharge, his hemoglobin was 7.3 with a hematocrit of 20.9, white count 13.6 and platelets 317.  The patient will no longer be seeing Dr. Lynnell Chad at [facility], since he has relocated to [facility] and he will be receiving care from Dr. Jacqualin Combes at [facility].   Dr. Lynnell Chad was contacted numerous times during the patients hospitalization and requested that Dr. Jacqualin Combes continue to follow him.  He will be writing a letter to Dr. Jacqualin Combes and also sending records on the patients behalf.  The patient will be following up with myself, Dr. Leonides Cave, in clinic in four days to recheck his hemoglobin and hematocrit at that time.  #2 - PULMONARY:  The patient remained stable on room air.  He was monitored closely for acute chest as he has had this in the past.  He did not become febrile, although he was treated empirically with Rocephin and azithromycin to cover for atypical during the early part of his hospitalization.  #3 - INFECTIOUS DISEASE:  Antibiotics were continued on him for 72 hours with no growth of blood cultures and he remained afebrile and therefore, the ceftriaxone was discontinued but the azithromycin continued.  #4 - RENAL:  The patient has a history of immune complex glomerulopathy.  The patient was continued on Vasotec and his BUN and creatinine were followed routinely.  On the fifth day of hospitalization, the patient was found to have a BUN of 18 and a creatinine of 1.3.  He had an elevation in his transaminases and it was thought that the elevation in creatinine was secondary to his elevation in transaminases.  He was continued on his Vasotec for nephrotic protection.  #5 - CARDIOVASCULAR:  The patient had no cardiac issues during hospitalization, although, his blood pressure appeared to be a very wide pressure during his hospitalization.  His blood pressures ranged anywhere from 92/46 to 90/36.  After speaking with Dr. Lynnell Chad at [facility] about this, he stated that many sickle cell patients have a wide postpressure and is not an issues unless the patient is symptomatic.  The patient remained completely asymptomatic when these readings were taken and this was not worked up any further.   #6 - GASTROINTESTINAL:  During  hospitalization, the patient started complaining of abdominal pain on his sixth day of hospitalization.  Right upper quadrant ultrasound revealed gallbladder sludge, no stones, probable polyps and a normal common bile duct at 4.5 mm.  No visual spleen was noted. A complete metabolic panel was also found to have a total bili of 26.2, alk phos of 280, SGOT of 358, SGPT of 236, total protein of 7.3, albumin of 3.8, calcium of 9.5.  The working diagnosis was acute hepatitic syndrome secondary to biliary sludge and/or obstruction.  After obtaining a fractionated bili, it was found that he had a direct bili of 12.1 and an indirect of 14.1 and the direct bili reflects significant cholestasis.  We continued to watch his fractionated bili every morning and would consider further diagnostic procedures such as ERCP if it does not improve.  Approximately three days later, his abdominal pain had completely resolved and his total bilirubin was 6.2 with a direct of 1.8, his alk phos 288, SGOT 94, and SGPT of 134.  The patients hepatitis panel was negative for hepatitis A, B, and C.  DISCHARGE CONDITION:  Good.  DISCHARGE DISPOSITION:  Discharged to home with family.  MEDICATIONS:  As per above.  DISCHARGE INSTRUCTIONS:  The patient was given no specific activity instructions.  He was told to follow a low-fat diet.  FOLLOWUP:  The patient will have a follow-up appointment with Dr. Leonides Cave at [facility] on May 31, 2000 at 1:30 p.m. for a hemoglobin check.  The patient will follow up with Dr. Baxter Flattery on Jun 19, 2000 at [facility] at 1:30 p.m. to establish regular medical care.  The patient will follow up with Dr. Jacqualin Combes at [facility] on Jun 27, 2000 at 9 a.m.  He will be receiving directions and instructions in the mail from the hem/onc office at [facility].  The patient and his mother voiced agreement and understanding of the above discharge plan and they had no  further questions. DD:  05/28/00 TD:  05/28/00 Job: [id number]

## 2010-07-01 NOTE — Discharge Summary (Signed)
NAME:  [name] [name], [name] [name]                      ACCOUNT NO.:  [id number]   MEDICAL RECORD NO.:  [id number]                   PATIENT TYPE:  INP   LOCATION:  [id number]                                 FACILITY:  [facility]   PHYSICIAN:  Blima Ledger, M.D.           DATE OF BIRTH:  Nov 05, 1980   DATE OF ADMISSION:  03/14/2003  DATE OF DISCHARGE:  03/16/2003                                 DISCHARGE SUMMARY   FINAL DIAGNOSES:  1. Sickle cell pain crisis.  2. Elevated transaminases.   DISCHARGE MEDICATIONS:  1. Enalapril 7.5 mg p.o. daily.  2. Hydroxyurea 1500 mg by mouth daily.  3. Percocet 10/650 1 tab p.o. q.6h. p.r.n. pain.   DISCHARGE INSTRUCTIONS:  Activity as tolerated, diet no restrictions, the  patient was encouraged to not take more pain medicine than prescribed and to  stay well hydrated and drink 8-10 glasses of water a day.  He is to follow  with Dr. Eliezer Lofts as needed.   DISPOSITION:  Discharge to home.  Discharge condition is stable and  improved.   HISTORY OF PRESENT ILLNESS:  Mr. [name] [name] is a 30 year old black male with  sickle cell disease who had been admitted March 10, 2003 through March 11, 2003 for sickle cell pain crisis and presented on the day of admission  with recurrent left-sided back and flank pain typical of his pain crises.  On exam, there was no evidence of acute chest and his hemoglobin was within  his baseline range of 9 to 10.  Chest x-ray also showed nothing acute.  The  patient was admitted for pain control and IV fluid hydration.   HOSPITAL COURSE:  Problem 1. Sickle cell pain crisis.  The patient was  initially written for OxyIR 5 mg q.6h. and Toradol 50 mg IV and this was  written from his prior hospitalization for pain control, but after he report  this, in fact, had not worked on last hospitalization, he was switched to  high dose Dilaudid PCA. This was, in fact, controlling his pain and the  decision was made to decrease to low dose  PCA.  It was stressed to the  patient during this hospitalization the risk of overdose and respiratory  depression as well as the abuse potential with Dilaudid and other narcotics.  After this frank discussion, the patient reported feeling much better with  much less pain and decided to take a shower.  He was agreeable to  discharging him on PCA and switching to p.o. pain medicine.  He then  received Dilaudid p.o. and Toradol p.o. with adequate pain control per  report and on exam.  The patient managed well and continued to push for  discharge and on March 16, 2003 after the patient reported continuing to  feel much better with baseline pain, he was deemed ready for discharge on  Percocet for pain control.   Problem 2.  History of elevated transaminases.  The patient  was noted to  have elevated AST and ALT which during his January hospitalization was  thought probably secondary to excessive acetaminophen ingestion.  During  this  hospitalization, his AST was still slightly elevated at 58 which was down  from 77 on January 25th.  His AST  was normal at 38.  He complained of no  symptoms and there was no need to follow this any further.   LABORATORY DATA:  Discharge labs:  Hemoglobin is 9.2, hematocrit 27,  reticulocyte count 3.8%.                                                Blima Ledger, M.D.    JM/MEDQ  D:  05/05/2003  T:  05/07/2003  Job:  [id number]   cc:   Eliezer Lofts, MD  Fax: [phone]

## 2010-07-01 NOTE — Discharge Summary (Signed)
[name] [name], GROESBECK NO.:  [id number]   MEDICAL RECORD NO.:  [id number]          PATIENT TYPE:  INP   LOCATION:  2920                         FACILITY:  [facility]   PHYSICIAN:  Arnette Norris, M.D.       DATE OF BIRTH:  07-Mar-1980   DATE OF ADMISSION:  11/08/2005  DATE OF DISCHARGE:  11/12/2005                                 DISCHARGE SUMMARY   PROCEDURES:  Chest x-rays:  First chest x-ray on November 09, 2005, was  normal. The second chest x-ray on November 10, 2005, showed interstitial  pattern and infiltration bilaterally. Chest x-ray of November 10, 2005,  showed worsening of bilateral pulmonary densities which were likely  representative of pneumonia.   CONSULTANTS:  Dr. Alen Blew and Dr. Beryle Beams with Hematology.   REASON FOR ADMISSION:  He was admitted for a sickle cell pain crisis.   DISCHARGE DIAGNOSES:  1. Sickle cell disease with pain crisis.  2. Pneumonia versus early acute chest syndrome.  3. Constipation.   DISCHARGE MEDICATIONS:  1. Colace 100 mg p.o. daily.  2. Folic acid 1 mg p.o. daily.  3. He is currently on a Dilaudid PCA 2 mg basal and 0.2 mg q.12 minutes.  4. Hydroxyurea 15 mg p.o. daily.  5. Avelox IV 400 mg q.24 hours.  6. 1/2 normal saline.  7. Tylenol 650 mg p.o.  8. Toradol 30 mg IV q.6 hours.   DISPOSITION:  The patient is going to be transferred to [facility] because family feels more comfortable in their care since  they have been following the patient's sickle cell for many years. The  patient will be transferred to a regular bed on telemetry.   DISCHARGE FOLLOWUP:  Will follow up with Dr. Birdie Riddle once he is released from  [facility]. The patient's family or the patient is to call to make  an appointment.   FOLLOWUP ISSUES:  1. Pain management. The patient has required Dilaudid PCA to manage his      pain.  2. The patient may also possibly need Senna for constipation.  3. Will also need to  follow the patient's chest x-rays closely to see if      there is any improvement in the pneumonia.  4. Will also need to watch the patient's oxygen saturations and vital      signs.   DISCHARGE CONDITION:  Currently stable. Had a spike in his temperature of  100.5 this morning, which is his day of discharge, but the patient has been  otherwise afebrile since November 10, 2005.   DISCHARGE LABS:  Reticulocyte count 8.1%, absolute reticulocytes 215.5.  Sodium 135, potassium 3.8, BUN 9, creatinine 1.1. Blood culture showed no  growth to date. Hemoglobin 9.7, hematocrit 27.5, white count 9.4, platelet  count 153.   HOSPITAL COURSE:  1. Sickle cell disease with vaso-occlusive crisis. The patient was      admitted on November 09, 2005, with pain in his lower extremities      without edema or trauma in his lower extremities at that time. He also  had some pain in his lower back that appeared to be more muscular. At      admission he was afebrile and his chest x-ray was negative. So we had      admitted him and decided to hydrate him and start him on a Dilaudid PCA      and Toradol for his pain. The patient was doing very well until on the      morning of November 10, 2005, he developed progressive hypoxia with an      oxygen saturation which dropped down to 60% on room air along with      worsening tachycardia. He also spiked a temperature of 102. Rapid      Response Team was called and he was started on 100% nonrebreather and      his blood gas was obtained which showed a pO2 of 131, a pCO2 of 42, pH      of 7.349 and HCO3 of 22.6. A repeat chest x-ray at that time showed      predominantly interstitial pattern of infiltrate bilaterally. This was      consistent with a mild anemia as well. The patient was then typed and      crossed and transfused with 2 units of packed red blood cells. We were      able to wean down his oxygen requirement to 4 L of nasal cannula at      which he  would saturate 94%. He was also started on IV antibiotics      which were ceftriaxone and azithromycin. The patient reported that pain      was improving slightly at that time. His breathing also improved after      receiving his transfusion. The patient was transferred to an ICU step-      down bed to monitor closely as we were worried that he was developing      acute chest. Chest x-ray on the morning of November 11, 2005, showed      worsening bilateral pulmonary density likely representative of      pneumonia. We also appreciate the input from Hematology, who believed      that his pneumonia was likely viral Mycoplasma and unlikely acute      chest. If it was acute chest it was very early in the process. We      decreased his IV fluids to 75 mL/hour as they did not want to fluid-      overload him. We continued him on his Dilaudid PCA for pain. Hemoglobin      at discharge was stable, vital signs stable.   1. Sickle cell anemia. His hemoglobin was stable status post transfusion      of 2 units packed red blood cells with his hemoglobin at discharge      being 9.7.   1. Constipation. Will continue Colace. Would consider changing over to      Senna if he continues to have constipation. This is likely caused by      being on such high dose narcotics.           ______________________________  Arnette Norris, M.D.     TA/MEDQ  D:  11/12/2005  T:  11/12/2005  Job:  [id number]   cc:   Annye Asa, M.D.

## 2010-07-01 NOTE — H&P (Signed)
NAME:  [name] [name], [name] [name] NO.:  [id number]   MEDICAL RECORD NO.:  [id number]          PATIENT TYPE:   LOCATION:                                 FACILITY:   PHYSICIAN:  Rolly Salter, M.D.        DATE OF BIRTH:   DATE OF ADMISSION:  06/22/2006  DATE OF DISCHARGE:                              HISTORY & PHYSICAL   PRIMARY CARE PHYSICIAN:  Dr. Annye Asa at the [facility].   CHIEF COMPLAINT:  Pain crisis of leg and back pain.   HISTORY OF PRESENT ILLNESS:  Patient is a 30 year old African American  male with a history of SS sickle cell disease with multiple admissions  that are pain crisis here  complaining of pain in legs and back.  Pain  started at 9:15 p.m. and is a 10/10.  Also having chest pain when he  tries to catch his breath and a little shortness of breath as well.  No  cough.  Has had a runny nose and congestion, which he attributes to  allergies.  Fever today.  Was recently admitted to [facility] for an episode  of priapism and dehydration.  He was given fluids and pain medications  throughout that time.  Has had 1 episode of acute chest in the past few  years, no sick contacts.   ALLERGIES:  1. MORPHINE CAUSED ITCHING.  2. MANDOL CAUSES HIVES AND A RASH.   MEDICATIONS:  1. Colace 100 mg 2 times a day as needed, but he only mainly takes      this when he is in the hospital, he is not taking currently.  2. Dulcolax pills as needed for constipation at home.  3. OxyContin 20 mg q.12 hours as needed for pain.  4. Vasotec 5 mg take 1.5 tablets daily for proteinuria.  5. Hydroxyurea at 500 mg t.i.d.   PAST MEDICAL HISTORY:  1. SS sickle cell disease.  Baseline hemoglobin 9.0 per mom, followed      by [facility], Dr. Marvetta Gibbons.  2. Constipation.  3. Proteinuria.  4. Creatinine baseline of 1.1 last admission.  5. History of acute chest, admitted for this in October of 2007,      received 2 units of packed red blood  cells at this time.  Also it      appears that his pain regimen at this time is high dose Dilaudid      PCA with a basal rate of 1 mg per hour.  Also, has appointments      with nephrology and urology coming up, has not seen yet.  6. Also has a history of elevated LFTs.   FAMILY HISTORY:  Sickle cell trait in mother, father and sister.  Unsure  of his dad.   SOCIAL HISTORY:  Patient lives with his girlfriend in [place].  He  does not smoke, drink or use drugs.  His father has been in jail in the  past.   PHYSICAL EXAMINATION:  VITAL SIGNS:  Temperature 97.9.  Pulse rate 103.  Respiratory rate 24.  Blood pressure 128/69.  Saturating 95% on room  air.  GENERAL:  Patient is alert, but in pain, a little sleepy at times.  HEENT:  Eyes:  Scleral icterus.  Pupils equal, round and reactive to  light.  NECK:  Supple with no masses.  CHEST WALL:  No deformities.  No tenderness.  LUNGS:  Fine crackles on inspiration bilaterally in middle posterior  lung fields bilaterally.  No wheezes.  No retractions or accessory  muscle use.  HEART:  Tachycardic.  Slight blowing systolic murmur loudest at left  lower sternal border.  ABDOMEN:  Soft, nontender.  Normoactive bowel sounds.  Nondistended.  No  masses.  No guarding.  No hepatosplenomegaly.  GENITALIA:  Penis without priapism.  MUSCULOSKELETAL:  Pain worse in the lower back, especially right side.  Pulses 2+ bilaterally radial and dorsalis pedis.  EXTREMITIES:  No edema.  NEUROLOGIC:  Alert and oriented x3.  Cervical nodes:  No anterior  cervical adenopathy and no posterior cervical adenopathy.   LABS:  Include a white blood cell count of 15.1, hemoglobin of 10.0,  hematocrit 29.4, platelets 221, neutrophil 55%, lymphocyte 38%, ANC 8.2,  MCV 132.2.  Sodium 136, potassium 3.8, chloride 107, bicarb 20,  creatinine 1.34, BUN 11, total bili of 3.7, ALP is 121, AST 39, ALT 20.  Reticulocyte count is pending at this time.  Chest x-ray showed  mild  peribronchial thickening, likely relates to chronic bronchitis.  Minimal  opacity next to the right minor fissure, likely related atelectasis and  peribronchial thickening.   ASSESSMENT/PLAN:  Patient is a 30 year old African American male with SS  sickle cell disease being treated for a sickle cell pain crisis, as well  as possible acute chest syndrome.   1. Sickle cell crisis.  Patient was placed on a Dilaudid PCA at a 2 mg      per hour basal dose and 0.2 mg q.15 minutes.  Demand and lock out      interval of 15 minutes and 4 hour limit of 10 mg.  We will start      off using Toradol as well, but only the 15 mg q.6 x5 days since he      does have an elevated creatinine and this is really dosing his      Toradol.  We also will hydrate at 125 mL an hour of D5 half normal      saline.  If creatinine rising, we will stop his Toradol.  We will      also continue his hydroxyurea.  Reticulocyte count is currently      pending and does have a high MCV, so he is likely reticing.  His      total bili is likely elevated due to hemolysis.  2. Possible acute chest syndrome.  The patient does have an increased      white blood cell count at 15.1 with a normal differential.  Also      complaining of fevers and has crackles in his lungs.  We will treat      for acute chest syndrome with ceftriaxone and azithromycin and      incentive spirometry.  Chest x-ray, final read, showed      peribronchial thickening and atelectasis.  We will recheck again in      the morning.  Also, we will get a CBC in the morning.  We will get      blood culture and urine culture prior to antibiotics.  3. Constipation.  We will  give Dulcolax as needed.  Schedule Colace 2      times a day since will be on a PCA.  4. Proteinuria.  We will continue Vasotec at his home dose.  Also,      patient will be following up with nephrology and urology as an      outpatient. 5. Disposition.  Disposition is pending resolution of  pain crisis and      chest findings on exam and control the p.o. meds regarding his      pain.           ______________________________  Rolly Salter, M.D.    JH/MEDQ  D:  06/23/2006  T:  06/23/2006  Job:  [id number]

## 2010-07-01 NOTE — Discharge Summary (Signed)
NAME:  [name] [name], [name] [name]                      ACCOUNT NO.:  [id number]   MEDICAL RECORD NO.:  [id number]                   PATIENT TYPE:  INP   LOCATION:  [id number]                                 FACILITY:  [facility]   PHYSICIAN:  Eliezer Lofts, M.D.                   DATE OF BIRTH:  25-Nov-1980   DATE OF ADMISSION:  01/14/2003  DATE OF DISCHARGE:  01/19/2003                                 DISCHARGE SUMMARY   DISCHARGE DIAGNOSES:  1. Sickle cell pain crisis.  2. Sickle cell anemia.  3. Acute chest syndrome.  4. Genital warts.   DISCHARGE MEDICATIONS:  1. Vicodin one to two tablets p.o. q.4h. p.r.n. pain.  2. Augmentin 500 mg p.o. b.i.d. x5 days.  3. Hydroxyurea 1500 mg one tablet p.o. daily.  4. Vasotec 7.5 mg p.o. daily.  5. Ibuprofen 800 mg p.o. q.8h. x5 days.   CONSULTATIONS:  None.   PROCEDURE:  Chest x-ray x2.   FOLLOW UP:  1. Follow-up appointment with Dr. Diona Browner at [facility] on     February 03, 2003, at 3:15 p.m.  2. Sickle cell clinic appointment [phone]) on February 04, 2003, at     10:15 with Dr. Thurmond Butts at [facility].   HISTORY OF PRESENT ILLNESS:  The patient is a 30 year old African American  male with sickle cell disease who presented with a three-day history of pain  across the lower chest and entire back.  The patient had run out of rescue  medication and was using Bayer aspirin for pain which was not controlling  the pain.  The patient denied signs and symptoms of infection but recorded a  dry cough.  The admission hemoglobin was 9.8, white count 9.7, MCV 123.5,  reticulocyte count 8.9.  Chest x-ray was no active disease, left basilar  scar, interstitial prominence and cardiomegaly.   HOSPITAL COURSE:  PROBLEM #1 -  SICKLE CELL ANEMIA:  The patient's baseline  hemoglobin usually runs between 9 and 10.  On admission, the patient's  hemoglobin was within this baseline.  He maintained himself on hydroxyurea  1500 mg   p.o. daily. and Vasotec 7.5 mg p.o. daily.  The patient was  discharged with these medications and with a follow-up at the [facility] with a new doctor, as mentioned in the follow-up section.  The  patient's hemoglobin stayed fairly stable while in the hospital with the  lowest hemoglobin reaching 7.3.  On the day of discharge, the patient's  hemoglobin was 7.7.   PROBLEM #2 -  PAIN CRISIS:  The patient was initially in significant  discomfort and was started on Dilaudid PCA with moderate control.  This was  increased to above the high dose Dilaudid PCA and used in combination with  ibuprofen 800 mg p.o. q.8h.  The patient continued to have pain and  therefore ibuprofen was stopped and Toradol was used  with good results.  Gradually, the patient was able to come off of the PCA and his pain was  maintained on Vicodin and ibuprofen.   PROBLEM #3 -  ACUTE CHEST SYNDROME:  Initially on presentation, the patient  was afebrile with a normal white count and no signs and symptoms of acute  chest.  The patient does have a history of acute chest.  On January 16, 2003, the patient developed a temperature up to 101.7, tachycardia and was  saturating 90% on room air, with a white count of 12.  It was thought that  this was likely viral but because of the risk of acute chest and systemic  infection, the patient was placed on Rocephin and Zithromax.  Chest x-ray  showed possible evolving bilateral basilar infiltrates.  Blood cultures were  negative x2.  The patient was placed on oxygen and IV fluids.  Over the next  few days, the patient remained afebrile and received a total of 5 days of  Rocephin and three days of IV Zithromax, two days of p.o. Zithromax.  On the  day of discharge, the patient was without symptoms and signs of acute chest.  He was discharged on five more days of Augmentin.  On the day of discharge  the patient's white blood cell count was 11.7.   PROBLEM #4 -  GENITAL  WARTS:  The patient mentioned genital warts that had  bothered him significantly with itching and bleeding.  These were treated  with Podophyllin applied to four warts in the genital area x1.  The patient  was instructed that they may need to be treated again.                                                Eliezer Lofts, M.D.    AB/MEDQ  D:  01/19/2003  T:  01/20/2003  Job:  [id number]
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
## 2010-07-04 ENCOUNTER — Other Ambulatory Visit: Payer: Self-pay | Admitting: Family Medicine

## 2010-07-04 DIAGNOSIS — D5700 Hb-SS disease with crisis, unspecified: Secondary | ICD-10-CM

## 2010-07-04 MED ORDER — OXYCODONE HCL 30 MG PO TB12: 30.0000 mg | ORAL_TABLET | Freq: Two times a day (BID) | ORAL | Status: DC

## 2010-07-04 MED ORDER — OXYCODONE-ACETAMINOPHEN 10-325 MG PO TABS: 1.0000 | ORAL_TABLET | Freq: Four times a day (QID) | ORAL | Status: DC | PRN

## 2010-07-04 NOTE — Telephone Encounter (Signed)
Pt requesting to pick up his 2 pain meds, call when ready

## 2010-07-04 NOTE — Telephone Encounter (Signed)
Patient informed, will be by to pick up rx's.

## 2010-07-04 NOTE — Telephone Encounter (Signed)
Rx ready up front

## 2010-07-06 ENCOUNTER — Emergency Department (HOSPITAL_COMMUNITY): Payer: Medicare Other

## 2010-07-06 ENCOUNTER — Emergency Department (HOSPITAL_COMMUNITY)
Admission: EM | Admit: 2010-07-06 | Discharge: 2010-07-07 | Disposition: A | Discharge status: Other Acute Inpatient Hospital | Payer: Medicare Other | Source: Home / Self Care | Attending: Emergency Medicine | Admitting: Emergency Medicine

## 2010-07-06 DIAGNOSIS — D5700 Hb-SS disease with crisis, unspecified: Secondary | ICD-10-CM | POA: Insufficient documentation

## 2010-07-06 DIAGNOSIS — I517 Cardiomegaly: Secondary | ICD-10-CM | POA: Insufficient documentation

## 2010-07-07 ENCOUNTER — Inpatient Hospital Stay (HOSPITAL_COMMUNITY)
Admission: AD | Admit: 2010-07-07 | Discharge: 2010-07-10 | DRG: 812 | Disposition: A | Discharge status: Home / Self Care | Payer: Medicare Other | Source: Other Acute Inpatient Hospital | Attending: Family Medicine | Admitting: Family Medicine

## 2010-07-07 ENCOUNTER — Encounter: Payer: Self-pay | Admitting: Family Medicine

## 2010-07-07 DIAGNOSIS — Z79899 Other long term (current) drug therapy: Secondary | ICD-10-CM

## 2010-07-07 DIAGNOSIS — Z87891 Personal history of nicotine dependence: Secondary | ICD-10-CM

## 2010-07-07 DIAGNOSIS — T426X5A Adverse effect of other antiepileptic and sedative-hypnotic drugs, initial encounter: Secondary | ICD-10-CM | POA: Diagnosis present

## 2010-07-07 DIAGNOSIS — D72829 Elevated white blood cell count, unspecified: Secondary | ICD-10-CM | POA: Diagnosis present

## 2010-07-07 DIAGNOSIS — N182 Chronic kidney disease, stage 2 (mild): Secondary | ICD-10-CM | POA: Diagnosis present

## 2010-07-07 DIAGNOSIS — D5700 Hb-SS disease with crisis, unspecified: Principal | ICD-10-CM | POA: Diagnosis present

## 2010-07-07 DIAGNOSIS — E872 Acidosis, unspecified: Secondary | ICD-10-CM | POA: Diagnosis present

## 2010-07-07 DIAGNOSIS — E860 Dehydration: Secondary | ICD-10-CM | POA: Diagnosis present

## 2010-07-07 DIAGNOSIS — K5909 Other constipation: Secondary | ICD-10-CM | POA: Diagnosis present

## 2010-07-07 DIAGNOSIS — Z8673 Personal history of transient ischemic attack (TIA), and cerebral infarction without residual deficits: Secondary | ICD-10-CM

## 2010-07-07 DIAGNOSIS — I129 Hypertensive chronic kidney disease with stage 1 through stage 4 chronic kidney disease, or unspecified chronic kidney disease: Secondary | ICD-10-CM | POA: Diagnosis present

## 2010-07-07 LAB — URINALYSIS, ROUTINE W REFLEX MICROSCOPIC
Bilirubin Urine: NEGATIVE
Protein, ur: 100 mg/dL — AB
Urobilinogen, UA: 0.2 mg/dL (ref 0.0–1.0)

## 2010-07-07 LAB — RETICULOCYTES: Retic Count, Absolute: 427.3 10*3/uL — ABNORMAL HIGH (ref 19.0–186.0)

## 2010-07-07 LAB — CBC
HCT: 26.3 % — ABNORMAL LOW (ref 39.0–52.0)
MCH: 38.5 pg — ABNORMAL HIGH (ref 26.0–34.0)
MCV: 106.5 fL — ABNORMAL HIGH (ref 78.0–100.0)
RDW: 23 % — ABNORMAL HIGH (ref 11.5–15.5)
WBC: 16.4 10*3/uL — ABNORMAL HIGH (ref 4.0–10.5)

## 2010-07-07 LAB — COMPREHENSIVE METABOLIC PANEL
AST: 42 U/L — ABNORMAL HIGH (ref 0–37)
Albumin: 4.3 g/dL (ref 3.5–5.2)
Alkaline Phosphatase: 144 U/L — ABNORMAL HIGH (ref 39–117)
BUN: 18 mg/dL (ref 6–23)
Chloride: 107 mEq/L (ref 96–112)
Potassium: 4.3 mEq/L (ref 3.5–5.1)
Total Bilirubin: 3.7 mg/dL — ABNORMAL HIGH (ref 0.3–1.2)

## 2010-07-07 LAB — DIFFERENTIAL
Basophils Relative: 0 % (ref 0–1)
Eosinophils Relative: 1 % (ref 0–5)
Lymphs Abs: 4.6 10*3/uL — ABNORMAL HIGH (ref 0.7–4.0)
Monocytes Absolute: 1.6 10*3/uL — ABNORMAL HIGH (ref 0.1–1.0)
Neutro Abs: 10 10*3/uL — ABNORMAL HIGH (ref 1.7–7.7)
Neutrophils Relative %: 61 % (ref 43–77)

## 2010-07-07 NOTE — Progress Notes (Signed)
Subjective:    Patient ID: [name] [name], male    DOB: [birth year], 30 y.o.   MRN: [id number]  [facility] Admission History and Physical  Patient name: [name] [name] Medical record number: [id number] Date of birth: [birth year] Age: 30 y.o. Gender: male  Primary Care Provider: Mylinda Latina, MD  Chief Complaint: Pain crisis History of Present Illness: [name] [name] is a 30 y.o. year old male presenting with hx of SS disease who presents with several day hx of pain in his extremities, mostly lower legs and back, Pt at this time has been given pain medication and mild somnolent, level 5 caveat does play in. Pt triggers includes change in weather which has occurred recently, and not eating or drinking well per pt.  Pt denies any recent illness, and pain is consistent with previous pain crisis. Pt originally presented to [name] where he received dilaudid 5mg  in total over five hours and 1 Liter bolus of fluid.  Denies fever, chills, nausea vomiting abdominal pain, dysuria, chest pain, shortness of breath dyspnea on exertion or numbness in extremities Please see intern's most complete note.   Patient Active Problem List  Diagnoses  . SICKLE CELL ANEMIA  . SICKLE CELL CRISIS  . TOBACCO ABUSE  . MIGRAINE, COMMON  . BACTERIAL PNEUMONIA  . CHRONIC KIDNEY DISEASE STAGE II (MILD)  . HYPOTHYROIDISM  . PERIPHERAL VASCULAR DISEASE  . Allergic rhinitis  . Cough   Past medical history, social, surgical and family history all reviewed.    No current outpatient prescriptions on file.   Review Of Systems: Per HPI with the following additions: see above.  Otherwise 12 point review of systems was performed and was unremarkable.  Physical Exam: Pulse: 72  Blood Pressure: 136/72 RR: 14   O2: 98 on 2L [place] Temp: 98.6  General: mild distress, slowed mentation and uncooperative HEENT: oropharynx clear, no lesions, neck supple with midline trachea, thyroid  without masses and trachea midline pinpoint pupils sluggish to respond Heart: 2/6 SEM is heard at lower left sternal border, S1 and S2 normal, regular rate and rhythm Lungs: clear to auscultation, no wheezes or rales and unlabored breathing Abdomen: no guarding or rigidity, no CVA tenderness Extremities: extremities normal, atraumatic, no cyanosis or edema pt though significantly tender in lower extremities bilateral and lower back, unable to get comfortable due to pain.  Skin:no rashes, no petechiae, no jaundice Neurology: normal without focal findings, mental status, speech normal, alert and oriented x3, PERLA and reflexes normal and symmetric  Labs and Imaging: Lab Results  Component Value Date/Time   NA 138 07/07/2010 12:08 AM   K 4.3 07/07/2010 12:08 AM   CL 107 07/07/2010 12:08 AM   CO2 16* 07/07/2010 12:08 AM   BUN 18 07/07/2010 12:08 AM   CREATININE 1.27 ICTERUS AT THIS LEVEL MAY AFFECT RESULT 07/07/2010 12:08 AM   CREATININE 1.43 05/03/2010 12:53 PM   GLUCOSE 124* 07/07/2010 12:08 AM   Lab Results  Component Value Date   WBC 16.4* 07/07/2010   HGB 9.5* 07/07/2010   HCT 26.3* 07/07/2010   MCV 106.5* 07/07/2010   PLT PLATELET CLUMPS NOTED ON SMEAR, COUNT APPEARS ADEQUATE SPECIMEN CHECKED FOR CLOTS REPEATED TO VERIFY PLATELET COUNT CONFIRMED BY SMEAR 07/07/2010   CXR: Cardiomegaly no infiltrates.    Assessment and Plan: [name] [name] is a 30 y.o. year old male presenting with sickle pain crisis  1. Sickle cell pain crisis-  Pt has been here multiple times in  the past. Pt last admission was in need of PCA.  Pt at this time is sedated so will try to do dilaudid PRN tordol scheduled and can consider adding tramadol.  Pt may need PCA later, no infections seen, no other trigger noticed. Will attempt to transition to PO soon if possible. Continue Hydroxyurea. 2.  Murmur-  Pt has had a murmur per prior physical exam.  Will monitor no work up necessary during admission may consider workup  outpt. Cardiomegaly on CXR  3.  Anemia-  Hgb 9.5, baseline 9-10 no need for transfusion unless HGB less then 7. 4.  Thyroid-  Consider TSH in AM 5.  CKD-  creatinine at baseline.  6.  Tobacco-  quit 2. FEN/GI: NS at 125 3. Prophylaxis: heparin 4. Disposition: pending clinical improvement.     Review of Systems     Objective:   Physical Exam        Assessment & Plan:

## 2010-07-07 NOTE — Progress Notes (Signed)
[facility] Admission History and Physical  Patient name: [name] [name] Medical record number: [id number] Date of birth: 12-15-1980 Age: 30 y.o. Gender: male  Primary Care Provider: Mylinda Latina, MD  Chief Complaint: pain in legs and back  History of Present Illness: [name] [name] is a 30 y.o. year old male presenting with sickle cell pain crisis in bilateral lower extremities and lower back. Patient was a difficult historian; he was taciturn. Unsure if this was due to sedation from analgesics, pain, or uncooperativeness. Patient was seen in [place] ED around 23:15pm on 07/06/2010. Pain had started 4 hours previously. Patient reports compliance with pain medications but pain was not alleviated by Oxycontin and prn oxycodone. Denies fevers, recent illnesses; does not know what could have set-off this pain crisis. Pain feels like typical pain crisis for him. Pain was initially 8/10. Would not give rating of pain at this time. Denies dyspnea. Unsure when his last bowel movement was. In ED, patient received total of 5mg  Dilaudid, 30mg  Toradol, 4mg  Zofran, and 1L NS bolus.  Patient was hospitalized twice in April for pain crises. He missed his hospital follow-up appointment at the end of April.   Patient Active Problem List  Diagnoses  . SICKLE CELL ANEMIA  . SICKLE CELL CRISIS  . TOBACCO ABUSE  . MIGRAINE, COMMON  . BACTERIAL PNEUMONIA  . CHRONIC KIDNEY DISEASE STAGE II (MILD)  . HYPOTHYROIDISM  . PERIPHERAL VASCULAR DISEASE  . Allergic rhinitis  . 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  No past medical history on file.  Past Surgical History: Cholecystectomy No past surgical history on file.  Social History: Lives alone. Denies  alcohol, tobacco (but previous smoker), other drugs.  History   Social History  . Marital Status: Single    Spouse Name: N/A    Number of Children: N/A  . Years of Education: N/A   Social History Main Topics  . Smoking status: Former Smoker    Quit date: 03/04/2010  . Smokeless tobacco: Not on file  . Alcohol Use: Not on file  . Drug Use: Not on file  . Sexually Active: Not on file   Other Topics Concern  . Not on file   Social History Narrative  . No narrative on file    Family History: Parents with sickle cell trait.  No family history on file.  Allergies: Cefamandole antibiotic No Known Allergies  No current outpatient prescriptions on file.   Review Of Systems: Per HPI. Otherwise 12 point review of systems was performed and was unremarkable.  Physical Exam: Pulse: 89  Blood Pressure: 123/76 RR: 16   O2: 97% on RA Temp: 97.6  General: eyes closed, lying in bed, uncomfortable, but easily arousable  HEENT: MMM Heart: 2/6 systolic murmur (noted on previous admissions) Lungs: no increased WOB, poor inspiratory effort, fair aeration, no wheezes/rales/areas of decreased breath sounds auscultated but coarse breath sounds  Abdomen: NABS, non-tender, non-distended Extremities: 2+ pedal pulses Neurology: ?somnolent; no focal deficits MSK: TTP bilateral lower extremities and lower back  Labs and Imaging: Lab Results  Component Value Date/Time   NA 138 07/07/2010 12:08 AM   K 4.3 07/07/2010 12:08 AM   CL 107 07/07/2010 12:08 AM   CO2 16* 07/07/2010 12:08 AM   BUN 18 07/07/2010 12:08 AM  CREATININE 1.27 ICTERUS AT THIS LEVEL MAY AFFECT RESULT 07/07/2010 12:08 AM   CREATININE 1.43 05/03/2010 12:53 PM   GLUCOSE 124* 07/07/2010 12:08 AM   Lab Results  Component Value Date   WBC 16.4* 07/07/2010   HGB 9.5* 07/07/2010   HCT 26.3* 07/07/2010   MCV 106.5* 07/07/2010   PLT PLATELET CLUMPS NOTED ON SMEAR, COUNT APPEARS ADEQUATE SPECIMEN CHECKED FOR CLOTS REPEATED TO VERIFY  PLATELET COUNT CONFIRMED BY SMEAR 07/07/2010   CXR:     1.  Stable cardiomegaly and chronic interstitial change.   2.  Slight decrease in lung volumes with mild bibasilar   atelectasis.   3.  No focal airspace disease.  UA: significant for blood and 100 protein. UMic: 0-2 RBCs.  Assessment and Plan: [name] [name] is a 30 y.o. year old male with sickle cell SS disease and with multiple previous admission for pain crisis and history of acute chest and priapism presenting with sickle cell pain crisis in his bilateral lower extremities and lower back.  1. Sickle cell pain crisis. Hgb is at baseline. Difficult to assess pain at this time based on reticence. Unclear whether this is due to somnolence from previous doses of Dilaudid (patient did have increased somnolence from previous episodes after getting Dilaudid), pain, or uncooperativeness. For pain, will give home Oxycontin 30mg  bid and give Dilaudid 0.5-1mg  q2hrs prn breakthrough pain, holding for sedation and respiratory distress or decreased respiratory effort. Will provide incentive spirometer to bedside. Do not think patient needs PCA at this time but will consider giving if current regiment does not adequately control pain. Will also give Toradol 30mg  IV q6hrs prn pain as well. Will give K-pad (heating pad) and apply q2hrs to legs and back to see if this helps pain. Patient appears hydrated and received 1L fluid bolus in the ED; do not think he needs more boluses. Will continue IVF @ maintenance. Patient currently on oxygen but was sating well without it. Patient denies chest pain, and CXR is negative for new infiltrates. Do not think he has acute chest. But will monitor his respiratory status closely in light of his elevated WBC and low bicarbonate. Patient has a anion-gap of 15 likely secondary to lactic acidosis. Consider checking ABG if patient has increased somnolence to confirm respiratory acidosis or see if some other cause for acidosis.    2.   Sickle cell SS disease. See #1. Will continue home hydroxyurea.  3.   H/o HTN. Will continue home enalapril 7.5mg  qd. 4.   H/o constipation from narcotics. Will give Miralax 17g qd and Colance 100mg  bid prn.  5.   FEN/GI: NS @ 125 mL/hr. Regular diet. 6. Prophylaxis: Heparin SQ. 7.   Leukocytosis. Likely secondary to demargination and elevated production of RBCs. Do not think patient has infection at this time but will monitor vitals and respiratory status closely for this, especially for acute chest.  8. Disposition: Pending clinical improvement and pain controlled with home oral pain medications.      Samuel Germany. Verdie Drown, PGY-1 FMTS

## 2010-07-08 ENCOUNTER — Inpatient Hospital Stay (HOSPITAL_COMMUNITY): Payer: Medicare Other

## 2010-07-08 LAB — BASIC METABOLIC PANEL
Chloride: 112 mEq/L (ref 96–112)
Creatinine, Ser: 1.32 mg/dL (ref 0.4–1.5)
GFR calc Af Amer: >60 mL/min (ref >60)
Potassium: 4.9 mEq/L (ref 3.5–5.1)

## 2010-07-08 LAB — CBC
HCT: 21.5 % — ABNORMAL LOW (ref 39.0–52.0)
HCT: 19.9 % — ABNORMAL LOW (ref 39.0–52.0)
MCV: 103.9 fL — ABNORMAL HIGH (ref 78.0–100.0)
MCV: 103.1 fL — ABNORMAL HIGH (ref 78.0–100.0)
Platelets: 161 10*3/uL (ref 150–400)
RBC: 2.07 MIL/uL — ABNORMAL LOW (ref 4.22–5.81)
RBC: 1.93 MIL/uL — ABNORMAL LOW (ref 4.22–5.81)
WBC: 13.2 10*3/uL — ABNORMAL HIGH (ref 4.0–10.5)
WBC: 13.2 10*3/uL — ABNORMAL HIGH (ref 4.0–10.5)

## 2010-07-08 LAB — RETICULOCYTES: Retic Count, Absolute: 449.2 10*3/uL — ABNORMAL HIGH (ref 19.0–186.0)

## 2010-07-08 NOTE — H&P (Signed)
NAME:  [name] [name] NO.:  [id number]  MEDICAL RECORD NO.:  [id number]           PATIENT TYPE:  I  LOCATION:  [id number]                         FACILITY:  [place]  PHYSICIAN:  Dalbert Mayotte, MD        DATE OF BIRTH:  1980-10-26  DATE OF ADMISSION:  07/07/2010 DATE OF DISCHARGE:                             HISTORY & PHYSICAL   PRIMARY CARE PROVIDER:  Mylinda Latina, MD at [facility].  CHIEF COMPLAINT:  Pain in legs and back.  HISTORY OF PRESENT ILLNESS:  [name] [name] is a 30 year old male presenting with a history of SS disease who presents with a 1 day history of pain in his lower extremities and in his back.  The patient at this time has been given pain medication and is mildly somnolent, although at level V caveat as planned.  The patient's triggers include change in weather which has occurred recently and not eating or drinking well per the patient.  He denies any recent illness and pain is consistent with previous pain crisis.  The patient originally presented to the [facility] Emergency Department where he received a total of 5 mg of Dilaudid over 5 hours in 1 liter normal saline bolus.  He denies fevers, chills, nausea, vomiting, abdominal pain, dysuria, chest pain, shortness of breath, dyspnea on exertion, or numbness in his extremities.  The pain was initially 8/10 in the emergency department. The patient would not give a rating of pain at this time.  PAST MEDICAL HISTORY: 1. 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 2. Stage II chronic kidney disease with a baseline creatinine of 1.2-     1.4. 3. History of acute chest syndrome. 4. Chronic constipation from narcotic. 5. 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 6. History of priapism.  PAST SURGICAL HISTORY:  Cholecystectomy.  SOCIAL HISTORY:  Lives alone.  Denies alcohol, tobacco, although was a previous smoker, other drugs.  FAMILY HISTORY:   Parents with sickle cell trait.  ALLERGIES:  CEFAMANDOLE ANTIBIOTIC.  PHYSICAL EXAMINATION:  VITAL SIGNS:  Pulse 89, blood pressure 123/76, respiratory rate 16, O2 sats 97% on room air, temperature 97.6. GENERAL:  Mild distress, slowed mentation and uncooperative. HEENT:  Oropharynx clear, no lesions.  Neck supple with midline. Trachea, thyroid without masses and trachea mid line, pinpoint pupils sluggish to respond. HEART:  2/6 systolic ejection murmur heard at left lower sternal border, S1 and S2 normal, regular rate and rhythm. LUNGS:  Clear to auscultation, no wheezes, rales, or labored breathing. ABDOMEN:  No guarding or rigidity, no CVA tenderness. EXTREMITIES:  Normal, atraumatic, no cyanosis or edema. although significantly tender in lower extremities bilaterally and in the lower back, and unable to get comfortable due to pain. SKIN:  No rashes, no petechiae, no jaundice. NEUROLOGY:  Normal without focal findings, mental status and speech normal, alert and oriented x3.  Pupils are equal, round, reactive to light and accommodation.  Reflex is normal and symmetric.  LABS AND IMAGING:  Electrolytes; sodium 138, potassium 4.3, chloride 107, bicarb 16, BUN 18, creatinine 1.27, glucose 124.  CBC; white blood count  16.4, hemoglobin 9.5, platelets clumped.  Chest x-ray; stable cardiomegaly and chronic interstitial changes with slight decrease in lung volume with mild basilar atelectasis.  No epical airspace disease. Urinalysis; significant for blood and 100 protein.  Urine microscopic; 0- 2 red blood cells.  ASSESSMENT AND PLAN:  [name] [name] is a 30 year old male with sickle cell SS disease presenting with sickle cell pain crisis in his lower extremities and back. 1. Sickle cell pain crisis.  The patient has presents multiple times     in the past.  The patient's last admission in April required a PCA     for pain control.  The patient at this time is sedated, so we will      try to do Dilaudid p.r.n. and with Toradol p.r.n.  Can consider     adding tramadol.  The patient may need PCA later.  No infection     seen.  No other triggers noticed.  We will attempt to transition to     p.o. soon if possible.  Continue hydroxyurea. 2. Murmur.  The patient has had a murmur from prior physical exam.  We     will monitor.  No workup necessary during this admission, but may     consider workup as an outpatient.  Cardiomegaly on chest x-ray. 3. Anemia.  Hemoglobin 9.5 with baseline hemoglobin 8-10.  No need for     transfusion unless hemoglobin less than 7. 4. Thyroid.  Consider TSH in the a.m. 5. Chronic kidney disease.  Creatinine at baseline. 6. Tobacco.  Quit. 7. Fluids, electrolytes, nutrition/gastrointestinal.  Normal saline at     125 mL per hour.  The patient does not appear dehydrated.  He also     received a 1 liter fluid bolus in the ED. 8. Prophylaxis.  Heparin subcu. 9. Disposition.  Pending clinical improvement. 10.History of constipation from narcotics.  We will give MiraLax 17 g     daily and Colace 100 mg b.i.d. p.r.n. 11.Leukocytosis.  Likely secondary to demargination and elevated     production of right blood cells consistent with elevated     reticulocyte count.  I do not think the patient has an infection at     this time with negative chest x-rays and the patient having no     fever.  We will monitor vitals however and respiratory closely for     signs of this infection especially for signs of acute chest. 12.Anion gap and low bicarbonate.  The patient has a non-anion gap of     15 likely secondary to lactic acidosis secondary to his pain     crisis.  We will consider checking ABG if the patient has increased     somnolence to confirm respiratory acidosis, received some other     cause for acidosis.  Respiratory status is stable at this point.     The patient is somnolent, however, likely secondary to narcotics.     We will monitor.  If  increasingly somnolent, we will consider     further workup.   ______________________________ Karen Kays, MD   ______________________________ Dalbert Mayotte, MD   AO/MEDQ  D:  07/07/2010  T:  07/08/2010  Job:  [id number]  Electronically Signed by Karen Kays MD on 07/08/2010 10:16:45 AM Electronically Signed by Dalbert Mayotte MD on 07/08/2010 [id number] PM

## 2010-07-09 LAB — CBC
MCH: 34.9 pg — ABNORMAL HIGH (ref 26.0–34.0)
MCHC: 37.1 g/dL — ABNORMAL HIGH (ref 30.0–36.0)
MCV: 94.2 fL (ref 78.0–100.0)
Platelets: 143 10*3/uL — ABNORMAL LOW (ref 150–400)
RDW: 23.3 % — ABNORMAL HIGH (ref 11.5–15.5)
WBC: 13.5 10*3/uL — ABNORMAL HIGH (ref 4.0–10.5)

## 2010-07-09 LAB — COMPREHENSIVE METABOLIC PANEL
Alkaline Phosphatase: 165 U/L — ABNORMAL HIGH (ref 39–117)
BUN: 16 mg/dL (ref 6–23)
CO2: 15 mEq/L — ABNORMAL LOW (ref 19–32)
Chloride: 107 mEq/L (ref 96–112)
Creatinine, Ser: 1.33 mg/dL (ref 0.4–1.5)
GFR calc non Af Amer: >60 mL/min (ref >60)
Glucose, Bld: 96 mg/dL (ref 70–99)
Potassium: 4.3 mEq/L (ref 3.5–5.1)
Total Bilirubin: 5 mg/dL — ABNORMAL HIGH (ref 0.3–1.2)

## 2010-07-09 LAB — RETICULOCYTES: Retic Count, Absolute: 418 10*3/uL — ABNORMAL HIGH (ref 19.0–186.0)

## 2010-07-10 LAB — TYPE AND SCREEN
Antibody Screen: NEGATIVE
Donor AG Type: NEGATIVE
Unit division: 0

## 2010-07-10 LAB — CBC
Hemoglobin: 9.2 g/dL — ABNORMAL LOW (ref 13.0–17.0)
Platelets: 138 10*3/uL — ABNORMAL LOW (ref 150–400)
RBC: 2.66 MIL/uL — ABNORMAL LOW (ref 4.22–5.81)
WBC: 10.2 10*3/uL (ref 4.0–10.5)

## 2010-07-15 LAB — CULTURE, BLOOD (ROUTINE X 2)
Culture  Setup Time: 201205260545
Culture: NO GROWTH
Culture: NO GROWTH

## 2010-07-20 NOTE — Discharge Summary (Signed)
NAME:  [name] [name], MADDEN NO.:  [id number]  MEDICAL RECORD NO.:  [id number]           PATIENT TYPE:  I  LOCATION:  [id number]                         FACILITY:  [facility]  PHYSICIAN:  Dalbert Mayotte, MD        DATE OF BIRTH:  Dec 28, 1980  DATE OF ADMISSION:  07/07/2010 DATE OF DISCHARGE:  07/10/2010                              DISCHARGE SUMMARY   PRIMARY CARE PHYSICIAN:  Mylinda Latina, MD, at [facility].  DISCHARGE DIAGNOSES: 1. Sickle cell pain crisis. 2. Sickle cell SS, baseline hemoglobin 8-9. 3. Chronic renal insufficiency, baseline creatinine 1.4-1.6. 4. History of acute coronary syndrome. 5. History of priapism. 6. History of cerebral infarct. 7. Hypertension.  DISCHARGE MEDICATIONS: 1. Hydroxyurea 1500 mg p.o. daily. 2. Nortriptyline 25 mg p.o. at bedtime. 3. Oxycodone CR 30 mg p.o. b.i.d. 4. Percocet 10/325 one tablet p.o. q.6 hours p.r.n. pain. 5. Promethazine 12.5 mg q.6 hours p.r.n. nausea. 6. Enalapril 7.5 mg p.o. daily.  CONSULTS:  None.  PROCEDURES:  None.  LABORATORY DATA:  On admission, the patient's CBC showed a white count of 16.4, hemoglobin of 9.5, clumped platelets.  After rehydration with WBC decreased to 13.2, hemoglobin to 7.9.  On day of discharge, WBC was 10.2, hemoglobin 9.2.  Initially on admission, the patient's creatinine was 1.27, discharge 1.33.  Blood cultures were negative x2.  Urinalysis was negative with the exception of 100 protein.  BRIEF HOSPITAL COURSE:  This is a 30 year old male with known SS disease and chronic renal insufficiency presenting with sickle cell pain crisis. 1. Pain crisis.  Initially, the patient's pain was difficult to     control.  He was started on Dilaudid PCA.  Basal rate was added as     it was difficult to obtain good control.  Eventually, it was     decided that the hemoglobin of 7.3 and uncontrollable pain, the     patient would be transfused 1 unit of PRBCs.  This improved his    hemoglobin to 9.6 and helped to have his pain under control.  In     addition at that same time, the patient spiked a temperature with a     max of 102 two days prior to discharge.  Chest x-ray was obtained,     and vanc and Zosyn were started.  Chest x-ray did not show any     areas of consolidation after blood cultures were negative x24     hours.  Antibiotics were discontinued.  The patient was weaned off     the Dilaudid PCA and was restarted on his home pain medicine.  Most     likely, the pain crisis was relieved by IV hydration and 1 unit of     PRBCs. 2. Chronic renal insufficiency.  The patient's baseline creatinine 1.4-     1.6.  He remained at his baseline throughout hospitalization.  No     changes were made. 3. Sickle cell disease.  The patient with baseline hemoglobin of 9-10,     was transfused with a hemoglobin of 7.3 due to intractable pain.  The patient was discharged home on his home hydroxyurea.  DISCHARGE INSTRUCTIONS:  The patient was instructed to stay hydrated, to return to clinic if pain is not relieved by his p.r.n. pain medicines.  FOLLOWUP APPOINTMENTS:  The patient is to follow up with Dr. Tye Savoy, he is to call the clinic for follow up in the next 1-2 weeks.  DISCHARGE CONDITION:  The patient was discharged home in stable medical condition on good p.o. pain regimen with good pain control again.    ______________________________ Lorin Glass, MD   ______________________________ Dalbert Mayotte, MD    JM/MEDQ  D:  07/12/2010  T:  07/12/2010  Job:  [id number]  cc:   Mylinda Latina, MD  Electronically Signed by Lorin Glass MD on 07/13/2010 10:18:42 AM Electronically Signed by Dalbert Mayotte MD on 07/20/2010 04:28:50 PM

## 2010-07-25 ENCOUNTER — Ambulatory Visit (INDEPENDENT_AMBULATORY_CARE_PROVIDER_SITE_OTHER): Payer: Medicare Other | Admitting: Family Medicine

## 2010-07-25 ENCOUNTER — Encounter: Payer: Self-pay | Admitting: Family Medicine

## 2010-07-25 VITALS — BP 98/73 | HR 76 | Temp 98.0°F | Wt 130.0 lb

## 2010-07-25 DIAGNOSIS — D5700 Hb-SS disease with crisis, unspecified: Secondary | ICD-10-CM

## 2010-07-25 MED ORDER — OXYCODONE-ACETAMINOPHEN 10-325 MG PO TABS: 1.0000 | ORAL_TABLET | Freq: Four times a day (QID) | ORAL | Status: DC | PRN

## 2010-07-25 MED ORDER — OXYCODONE HCL 40 MG PO TB12: 40.0000 mg | ORAL_TABLET | Freq: Two times a day (BID) | ORAL | Status: DC

## 2010-07-25 NOTE — Assessment & Plan Note (Addendum)
Overall pain much improved.  He is still having some left leg pain but it is not bad.  There is no signs of serious injury.  He still had a pain crisis with his current pain regimen so will increase the Oxycontin to 40mg  bid.  Reviewed precautions and possible side effects.  Patient voiced his understanding.  Also encouraged him to schedule an appointment with [facility].  He said that he would schedule an appointment.

## 2010-07-25 NOTE — Progress Notes (Signed)
  Subjective:    Patient ID: [name] [name]., male    DOB: [birth year], 30 y.o.   MRN: [id number]  HPI Hospital Follow Up  1. F/U SS pain crisis:  Pt was recently discharged from the hospital with a SS pain crisis.  The pain was in his left leg and in his back.  He doesn't know what could have triggered this episode.  He was taking his pain medications and Hydroxyurea as prescribed.  He was out grilling and drinking a couple beers the night before the pain crisis started so he might have been a little dehydrated.  He is feeling much better now after hospitalization.  He is still having some pain in his left leg but it is not that bad.  He is managing it with his normal pain medications.  He thinks that he may need an increased dose of the control medication.  Pain currently rated a 2/10.  Staying hydrated.   Review of Systems Denies fevers, chills, shortness of breath, chest pain, abdominal pain, headache, vision changes    Objective:   Physical Exam  Constitutional: He appears well-nourished. No distress.       Thin  HENT:  Head: Normocephalic and atraumatic.  Eyes: Scleral icterus is present.  Neck: Normal range of motion.  Cardiovascular: Normal rate and regular rhythm.   Murmur heard. Pulmonary/Chest: Effort normal and breath sounds normal. No respiratory distress. He has no wheezes.  Abdominal: Soft. Bowel sounds are normal. He exhibits no distension. There is no tenderness.  Musculoskeletal: Normal range of motion. He exhibits no edema.       Slightly TTP along anterior left shin.  No redness, swelling, or skin changes.  Lymphadenopathy:    He has no cervical adenopathy.  Skin: No rash noted. He is not diaphoretic.          Assessment & Plan:

## 2010-08-11 IMAGING — CR DG CHEST 2V
2 series · 2 of 2 positions shown · non-contrast
Comparison: 10/26/2008

CLINICAL DATA: Chest pain, sickle cell crisis

CHEST - 2 VIEW

[w chest pa]
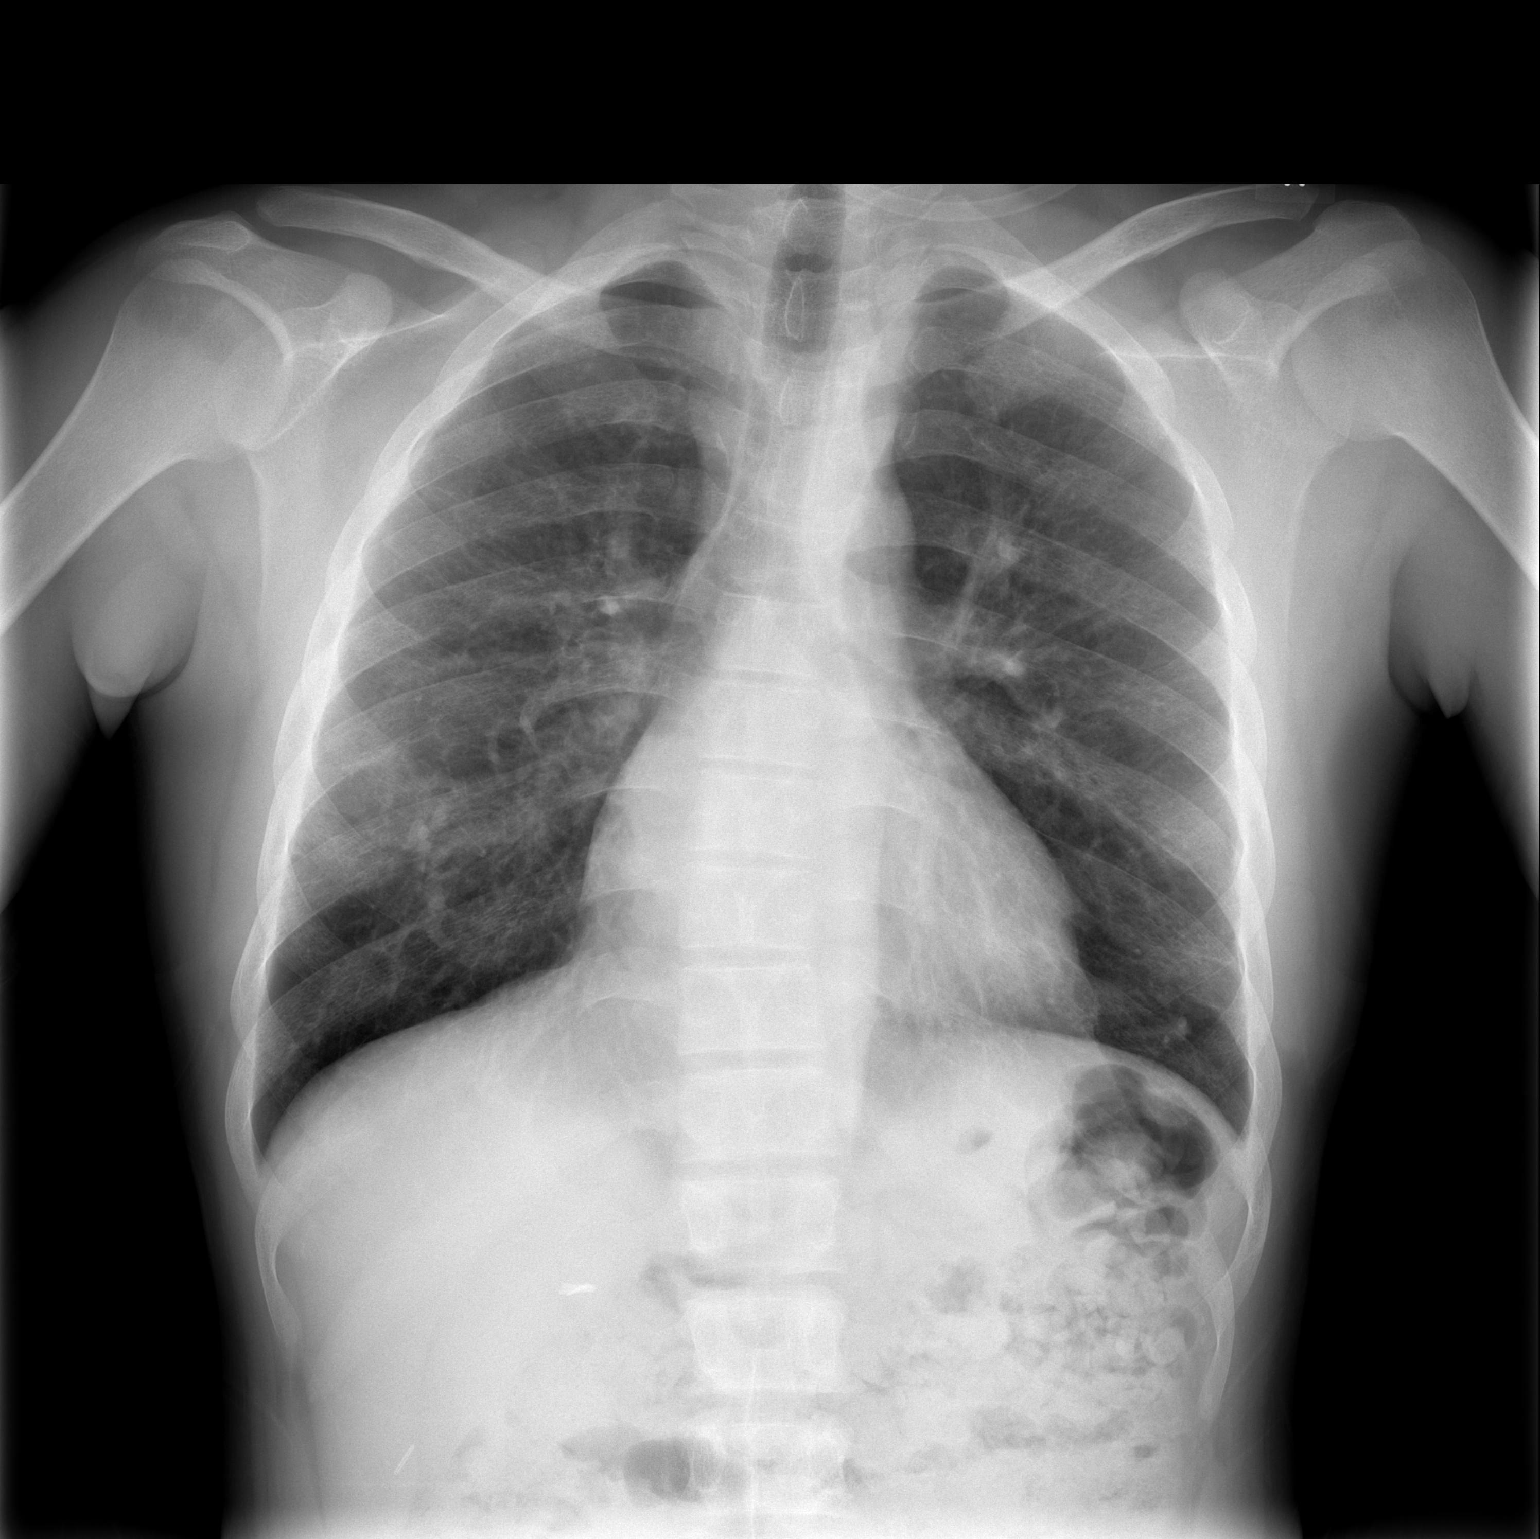

[w chest lat]
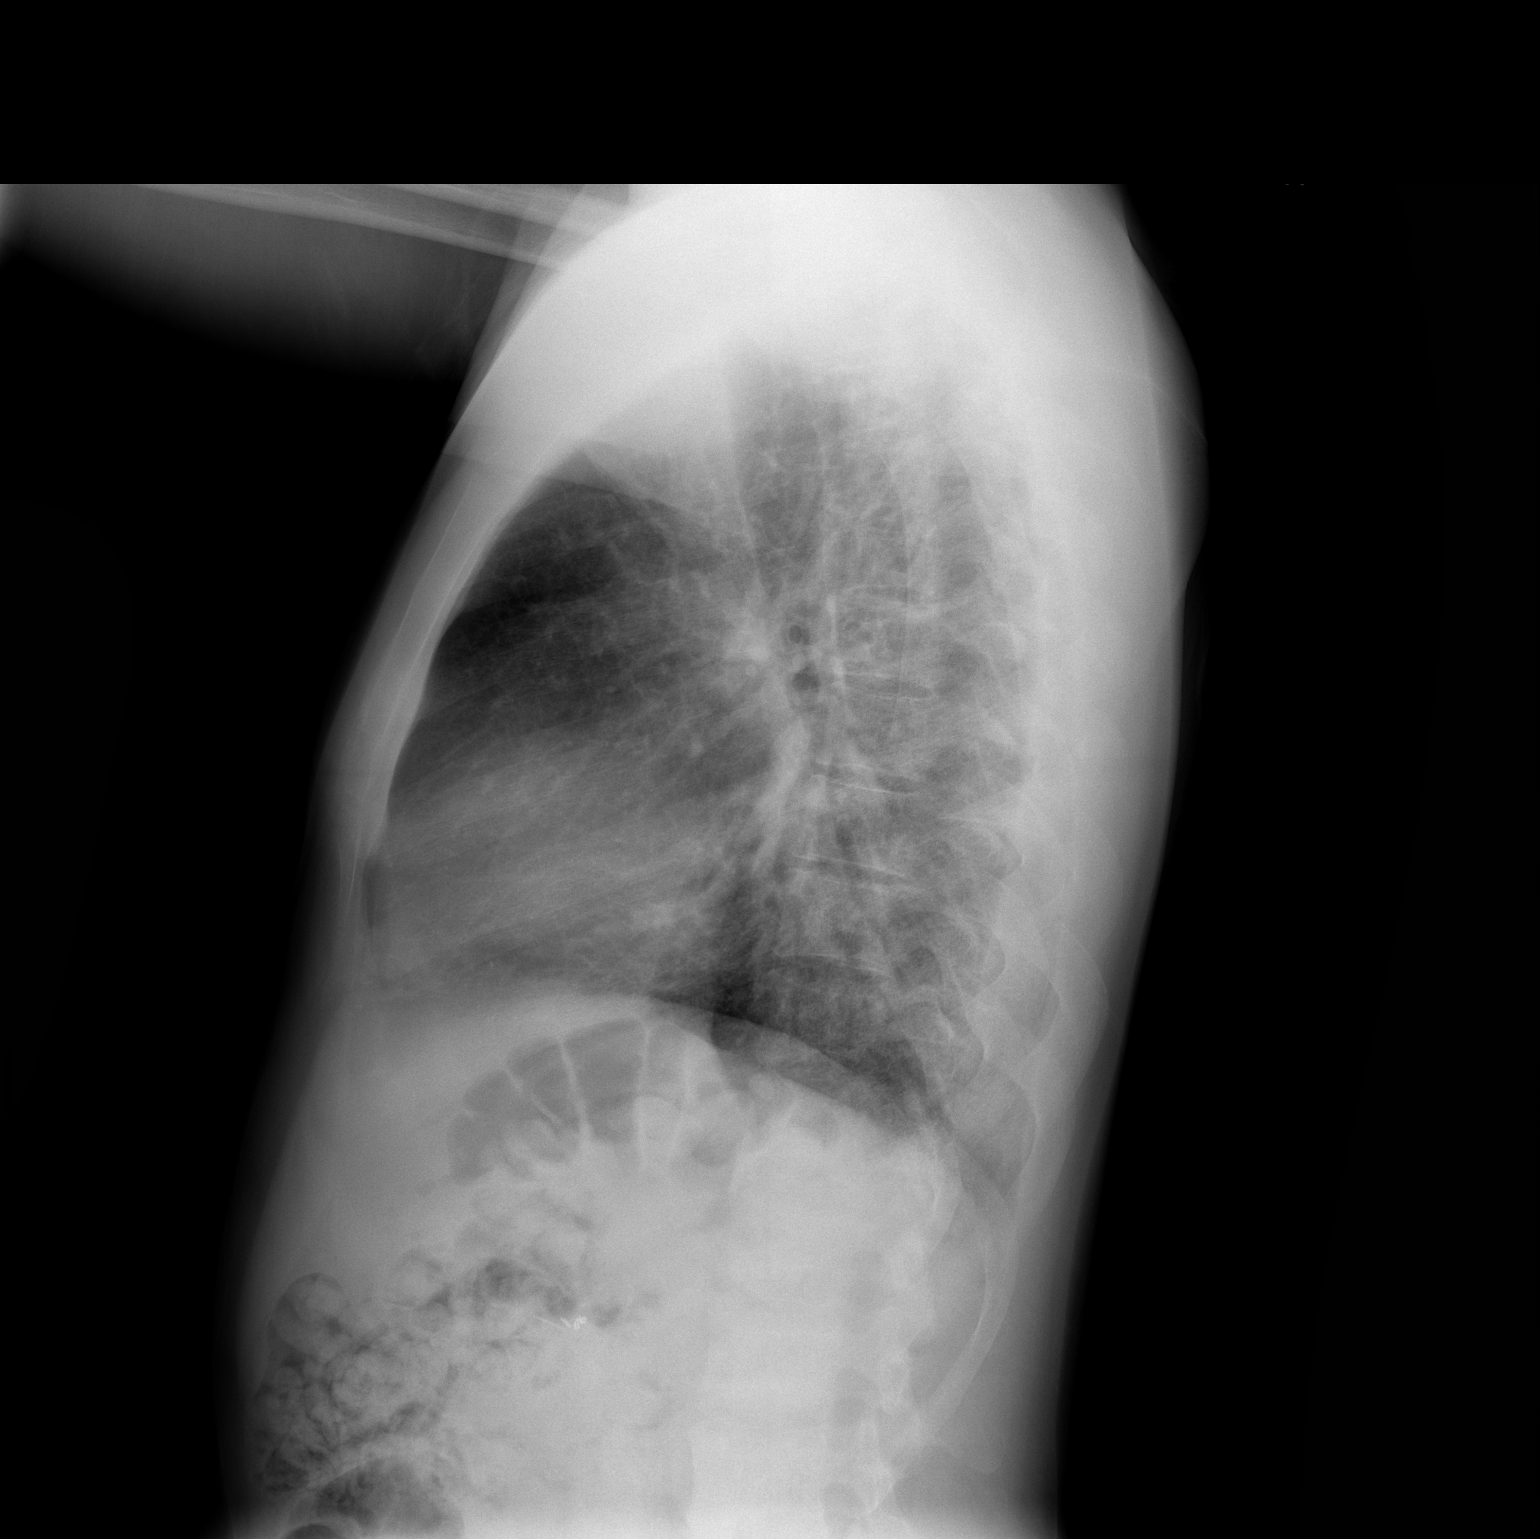

[2 of 2 positions shown; findings below may reference images not displayed]

FINDINGS: Stable coarse interstitial markings and prominent lower
lobe vascular markings.  No superimposed pneumonia, significant
edema, effusion or pneumothorax.  Stable heart size.  Previous
cholecystectomy noted.
IMPRESSION: Stable chronic findings.  No superimposed acute process.

## 2010-08-11 IMAGING — CT CT HEAD W/O CM
1 of 2 series · 13 of 30 positions shown, 17 images · non-contrast
Comparison: CT and MRI of the head 02/22/2007.

CLINICAL DATA: 28-year-old male with sickle cell crisis.  Head
pain.  No known trauma.

CT HEAD WITHOUT CONTRAST
TECHNIQUE: Contiguous axial images were obtained from the base of
the skull through the vertex without contrast.

[Series 2: brain · axial · 0.47mm/px · z∈[-91,+45]mm · 13 of 32 slices shown, 17 images]
[im 3/32  brain]
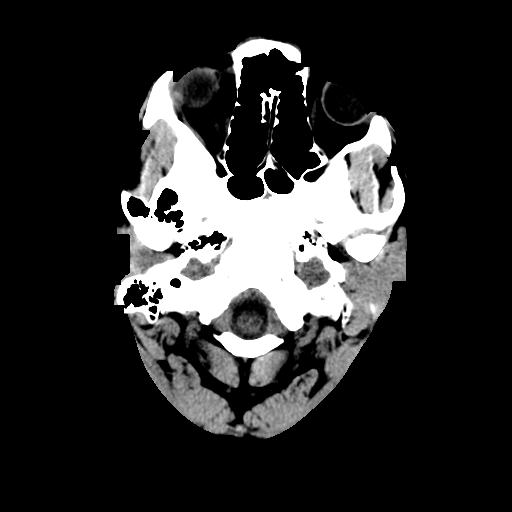
[im 3/32  bone]
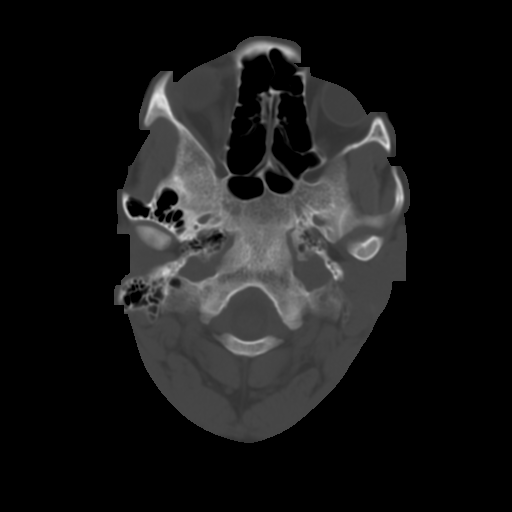
[im 5/32  brain]
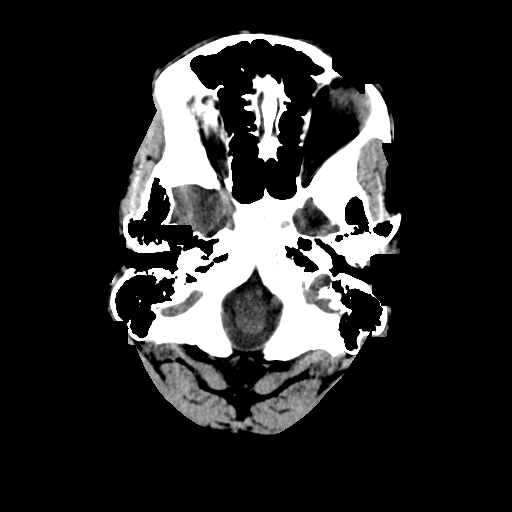
[im 7/32  brain]
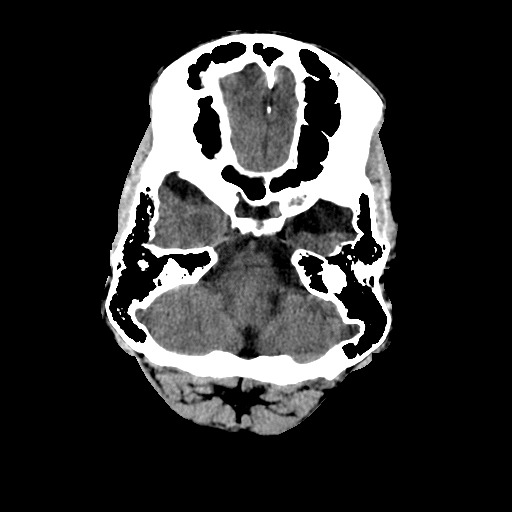
[im 9/32  brain]
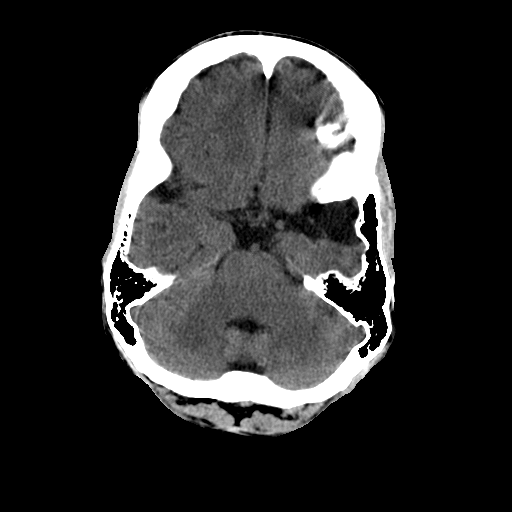
[im 12/32  brain]
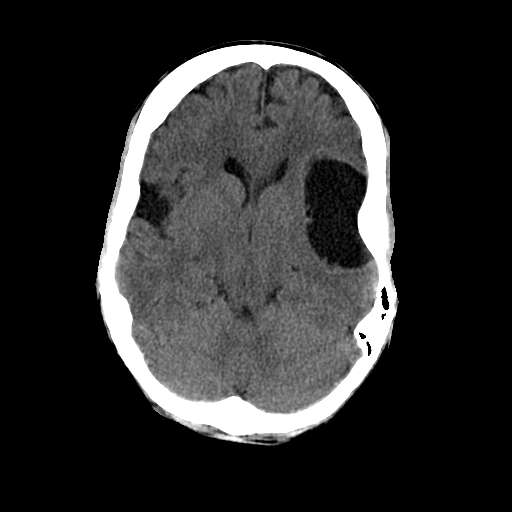
[im 12/32  bone]
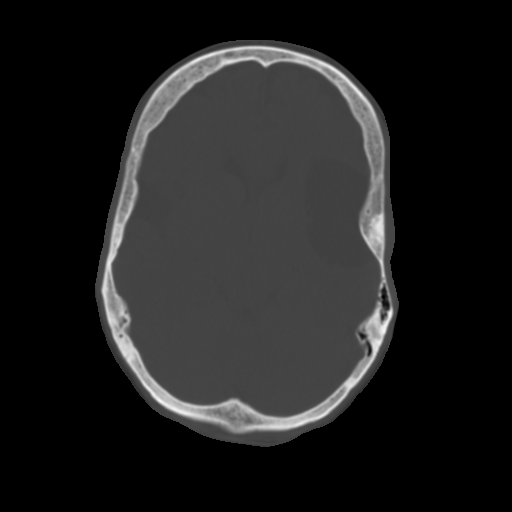
[im 14/32  brain]
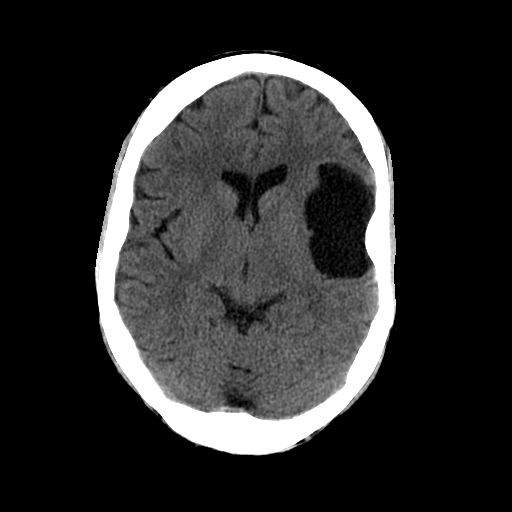
[im 16/32  brain]
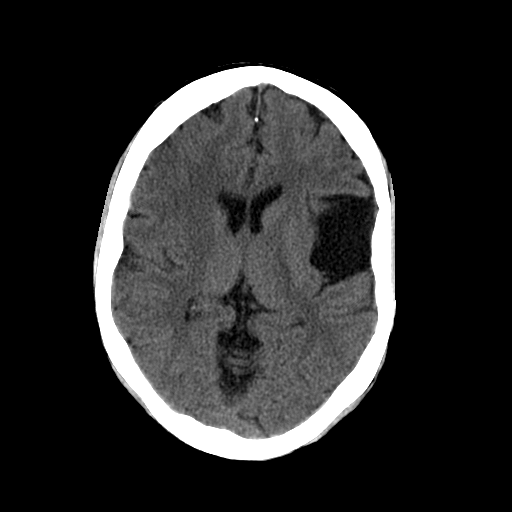
[im 18/32  brain]
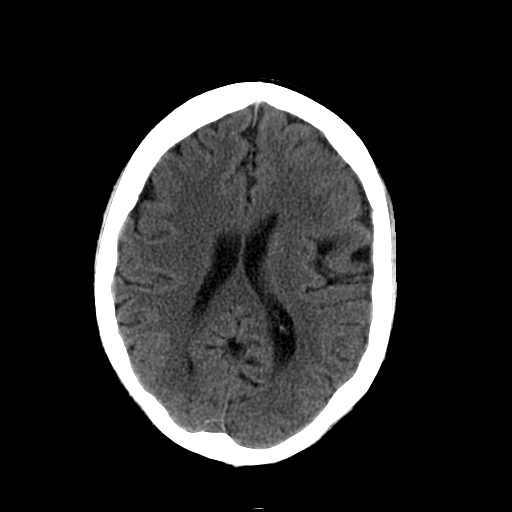
[im 20/32  brain]
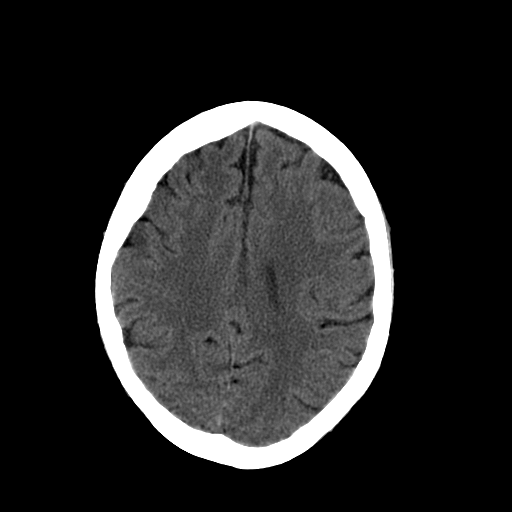
[im 20/32  bone]
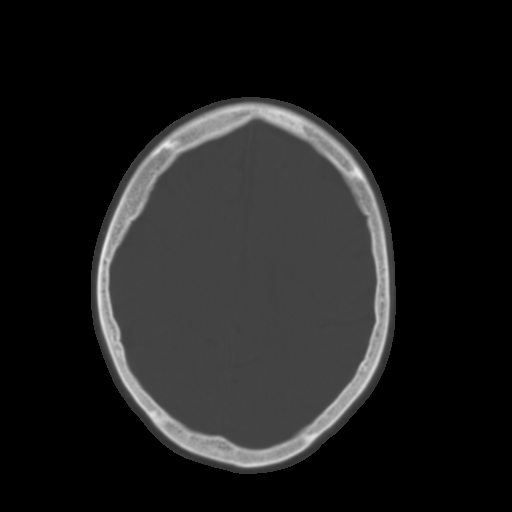
[im 23/32  brain]
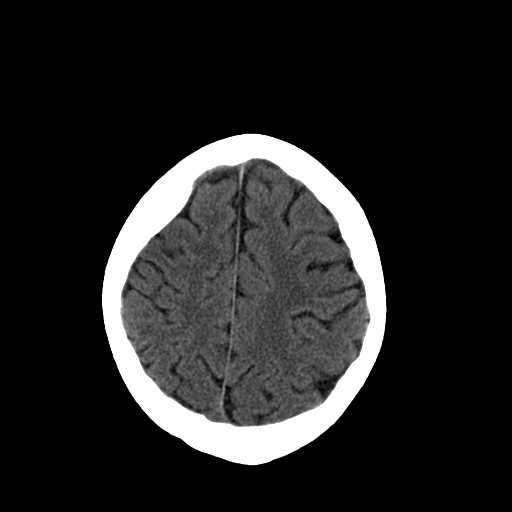
[im 25/32  brain]
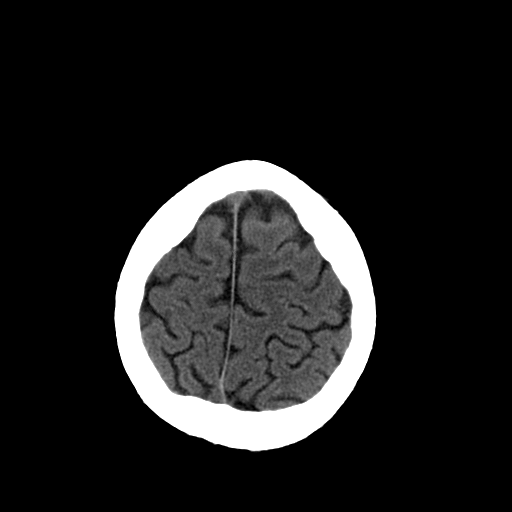
[im 27/32  brain]
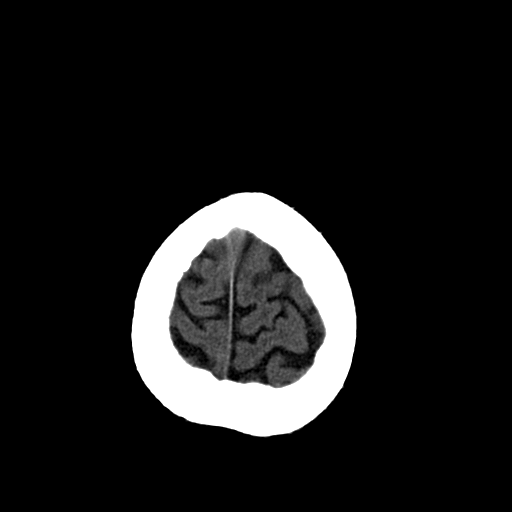
[im 29/32  brain]
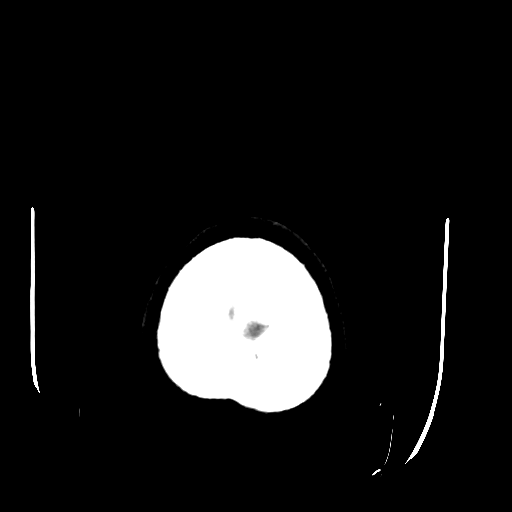
[im 29/32  bone]
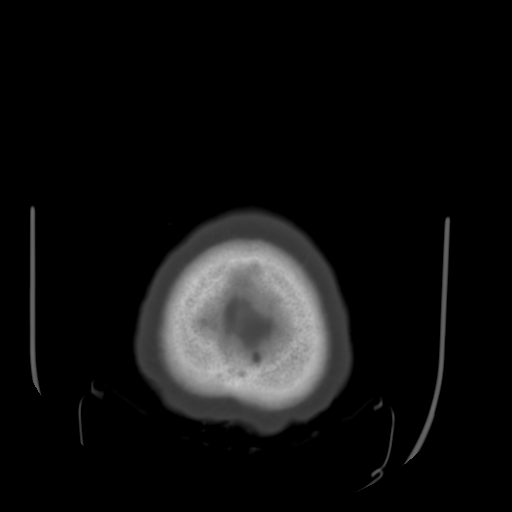

[13 of 30 positions shown; findings below may reference images not displayed]

FINDINGS: Disconjugate gaze. Visualized scalp soft tissues are
within normal limits.  Visualized paranasal sinuses and mastoids
are clear.  No acute osseous abnormality identified.  Generalized
expansion of the marrow space.

Stable chronic volume loss in the left sylvian fissure with CSF
prominence.  There is also right Evette volume loss to a
lesser extent. Stable ventricle size and configuration.  No midline
shift, mass effect, or evidence of mass lesion.  No acute
intracranial hemorrhage identified.  No evidence of cortically
based acute infarction identified.  No suspicious intracranial
vascular hyperdensity. Low attenuation of intracranial vascular
structures suggests anemia.
IMPRESSION: 1. Stable noncontrast CT appearance of the brain with left greater
than right Evette volume loss.
2.  Changes of sickle cell anemia including prominent osseous
marrow spaces and low attenuation of vascular structures.

## 2010-08-16 ENCOUNTER — Telehealth: Payer: Self-pay | Admitting: Family Medicine

## 2010-08-16 DIAGNOSIS — D5700 Hb-SS disease with crisis, unspecified: Secondary | ICD-10-CM

## 2010-08-16 MED ORDER — OXYCODONE HCL 40 MG PO TB12: 40.0000 mg | ORAL_TABLET | Freq: Two times a day (BID) | ORAL | Status: DC

## 2010-08-16 MED ORDER — OXYCODONE-ACETAMINOPHEN 10-325 MG PO TABS: 1.0000 | ORAL_TABLET | Freq: Four times a day (QID) | ORAL | Status: DC | PRN

## 2010-08-16 NOTE — Telephone Encounter (Signed)
Please let [name] [name] know his prescriptions for Oxycontin and percocet are available at the [facility] front desk for pick up.

## 2010-08-16 NOTE — Telephone Encounter (Signed)
Patient informed. 

## 2010-08-16 NOTE — Telephone Encounter (Signed)
Needs refill on Oxycontin, percocet pls call when ready  ** needs before Dr Verdie Drown comes back

## 2010-08-31 ENCOUNTER — Encounter: Payer: Self-pay | Admitting: Family Medicine

## 2010-08-31 ENCOUNTER — Ambulatory Visit (INDEPENDENT_AMBULATORY_CARE_PROVIDER_SITE_OTHER): Payer: Medicare Other | Admitting: Family Medicine

## 2010-08-31 VITALS — BP 111/73 | HR 97 | Temp 99.2°F | Wt 129.0 lb

## 2010-08-31 DIAGNOSIS — D5700 Hb-SS disease with crisis, unspecified: Secondary | ICD-10-CM

## 2010-08-31 MED ORDER — KETOROLAC TROMETHAMINE 30 MG/ML IJ SOLN
30.0000 mg | Freq: Once | INTRAMUSCULAR | Status: AC
  Administered 2010-08-31: 30 mg via INTRAMUSCULAR

## 2010-08-31 MED ORDER — OXYCODONE-ACETAMINOPHEN 10-325 MG PO TABS: 1.0000 | ORAL_TABLET | Freq: Four times a day (QID) | ORAL | Status: DC | PRN

## 2010-08-31 MED ORDER — OXYCODONE HCL 40 MG PO TB12: 40.0000 mg | ORAL_TABLET | Freq: Two times a day (BID) | ORAL | Status: DC

## 2010-08-31 NOTE — Patient Instructions (Signed)
Thank you for coming in today. Fill the RX for percocet and oxycontin starting on the 27th before you go to [facility]. Make an appointment with Dr. Verdie Drown prior to Aug 27th for med refils.  Let us know if the pain worsenes.

## 2010-08-31 NOTE — Progress Notes (Signed)
[name] [name] notes that his sickle cell pain is pretty much stable to perhaps somewhat worse than usual. He thinks that he may be having a mild pain event. [name] as directed. [name] for better pain control. Pain located in lumbar back and BL thighs and knees.   He does note that he will be going to [facility] for 3 weeks on the 30th. [name] in [place] and wonders if we can do a refill here.   PMH reviewed.  ROS as above otherwise neg  Exam:  Vs noted.  Gen: Well NAD Lungs: CTABL Nl WOB Heart: RRR no 2/6 SEM no radiating Abd: NABS, NT, ND Exts: Non edematous BL  LE MSK: No spinal midline TTP. Mild left paraspinal lumbar TTP. Legs are not TTP. No knee effusions. Normal gait.

## 2010-08-31 NOTE — Assessment & Plan Note (Signed)
Think pt has pain from sickle cell disease.  He will be leaving town and will need pain medications.  Will provide 80 tablets of percocet and 60 of oxycontin. WIll write Rx so they cannot be filled until the 27th.  Will f/u with PCP next month to get on 3 month refill program.

## 2010-09-06 ENCOUNTER — Emergency Department (HOSPITAL_COMMUNITY)
Admission: EM | Admit: 2010-09-06 | Discharge: 2010-09-07 | Disposition: A | Discharge status: Home / Self Care | Payer: Medicare Other | Attending: Emergency Medicine | Admitting: Emergency Medicine

## 2010-09-06 DIAGNOSIS — R6883 Chills (without fever): Secondary | ICD-10-CM | POA: Insufficient documentation

## 2010-09-06 DIAGNOSIS — M545 Low back pain, unspecified: Secondary | ICD-10-CM | POA: Insufficient documentation

## 2010-09-06 DIAGNOSIS — IMO0001 Reserved for inherently not codable concepts without codable children: Secondary | ICD-10-CM | POA: Insufficient documentation

## 2010-09-06 DIAGNOSIS — R630 Anorexia: Secondary | ICD-10-CM | POA: Insufficient documentation

## 2010-09-06 DIAGNOSIS — D5700 Hb-SS disease with crisis, unspecified: Secondary | ICD-10-CM | POA: Insufficient documentation

## 2010-09-06 DIAGNOSIS — Z79899 Other long term (current) drug therapy: Secondary | ICD-10-CM | POA: Insufficient documentation

## 2010-09-06 DIAGNOSIS — M79609 Pain in unspecified limb: Secondary | ICD-10-CM | POA: Insufficient documentation

## 2010-09-06 LAB — URINALYSIS, ROUTINE W REFLEX MICROSCOPIC
Leukocytes, UA: NEGATIVE
Protein, ur: 100 mg/dL — AB
Urobilinogen, UA: 1 mg/dL (ref 0.0–1.0)

## 2010-09-06 LAB — COMPREHENSIVE METABOLIC PANEL
ALT: 21 U/L (ref 0–53)
CO2: 24 mEq/L (ref 19–32)
Calcium: 9 mg/dL (ref 8.4–10.5)
Chloride: 105 mEq/L (ref 96–112)
GFR calc Af Amer: >60 mL/min (ref >60)
GFR calc non Af Amer: 60 mL/min — ABNORMAL LOW (ref >60)
Glucose, Bld: 86 mg/dL (ref 70–99)
Sodium: 138 mEq/L (ref 135–145)
Total Bilirubin: 3.5 mg/dL — ABNORMAL HIGH (ref 0.3–1.2)

## 2010-09-06 LAB — DIFFERENTIAL
Basophils Absolute: 0 10*3/uL (ref 0.0–0.1)
Basophils Relative: 0 % (ref 0–1)
Lymphocytes Relative: 35 % (ref 12–46)
Monocytes Relative: 9 % (ref 3–12)
Neutro Abs: 7.3 10*3/uL (ref 1.7–7.7)

## 2010-09-06 LAB — CBC
Hemoglobin: 8.6 g/dL — ABNORMAL LOW (ref 13.0–17.0)
MCH: 37.7 pg — ABNORMAL HIGH (ref 26.0–34.0)
MCV: 103.9 fL — ABNORMAL HIGH (ref 78.0–100.0)
RBC: 2.28 MIL/uL — ABNORMAL LOW (ref 4.22–5.81)

## 2010-09-06 LAB — RETICULOCYTES: Retic Count, Absolute: 313.9 10*3/uL — ABNORMAL HIGH (ref 19.0–186.0)

## 2010-09-06 LAB — URINE MICROSCOPIC-ADD ON

## 2010-09-08 ENCOUNTER — Encounter: Payer: Self-pay | Admitting: Family Medicine

## 2010-09-28 ENCOUNTER — Telehealth: Payer: Self-pay | Admitting: Family Medicine

## 2010-09-28 NOTE — Telephone Encounter (Signed)
Levada Dy,  Does he need an appt? Iram Astorino, Salome Spotted

## 2010-09-28 NOTE — Telephone Encounter (Signed)
Need to pick up new rxs for Oxycontin and Percocet.  Call when ready

## 2010-10-04 ENCOUNTER — Encounter: Payer: Self-pay | Admitting: Family Medicine

## 2010-10-04 NOTE — Telephone Encounter (Signed)
Pt informed and appt made for tomorrow. [name] [name]  

## 2010-10-04 NOTE — Telephone Encounter (Signed)
The patient was advised to make an appointment with me before August 27th for refills at his last visit about a month ago. Pamala Hurry, please ask the patient to make an appointment for this week. I have time tomorrow morning if he wants to come in then.

## 2010-10-05 ENCOUNTER — Ambulatory Visit (INDEPENDENT_AMBULATORY_CARE_PROVIDER_SITE_OTHER): Payer: Medicare Other | Admitting: Family Medicine

## 2010-10-05 ENCOUNTER — Encounter: Payer: Self-pay | Admitting: Family Medicine

## 2010-10-05 VITALS — BP 107/72 | HR 106 | Temp 98.1°F | Ht 64.0 in | Wt 137.2 lb

## 2010-10-05 DIAGNOSIS — D5700 Hb-SS disease with crisis, unspecified: Secondary | ICD-10-CM

## 2010-10-05 DIAGNOSIS — N182 Chronic kidney disease, stage 2 (mild): Secondary | ICD-10-CM

## 2010-10-05 DIAGNOSIS — D571 Sickle-cell disease without crisis: Secondary | ICD-10-CM

## 2010-10-05 MED ORDER — ENALAPRIL MALEATE 2.5 MG PO TABS: 7.5000 mg | ORAL_TABLET | Freq: Every day | ORAL | Status: DC

## 2010-10-05 MED ORDER — KETOROLAC TROMETHAMINE 30 MG/ML IJ SOLN
30.0000 mg | Freq: Once | INTRAMUSCULAR | Status: AC
  Administered 2010-10-05: 30 mg via INTRAMUSCULAR

## 2010-10-05 MED ORDER — OXYCODONE HCL 40 MG PO TB12: 40.0000 mg | ORAL_TABLET | Freq: Two times a day (BID) | ORAL | Status: DC

## 2010-10-05 MED ORDER — OXYCODONE-ACETAMINOPHEN 10-325 MG PO TABS: 1.0000 | ORAL_TABLET | Freq: Four times a day (QID) | ORAL | Status: DC | PRN

## 2010-10-05 MED ORDER — HYDROXYUREA 500 MG PO CAPS: 1500.0000 mg | ORAL_CAPSULE | Freq: Every day | ORAL | Status: DC

## 2010-10-05 NOTE — Assessment & Plan Note (Signed)
Will monitor. No labs today since had Cr checked a month ago. Will check BMET at next visit. Tx for kidney dysfunction due to sickle cell is ACEi, which he is on. He takes this medication most days (misses about 1/7 days). Continue enalapril.

## 2010-10-05 NOTE — Assessment & Plan Note (Addendum)
Stable but with persistent lower back pain, consistent with where he usually gets pain from pain crisis. Requesting Toradol shot and feel this is appropriate to give now. But looking as his slightly elevated Cr, will need to be cautious about frequent Toradol injections. Will give 3 months worth of narcotics, Oxycontin for baseline pain and percocet for breakthrough. Doesn't take more than 2 percocets a day usually. Patient seems to be on a stable dose of medications. Encouraged taking hydroxyurea which he hasn't been taking consistently since he felt no benefit from this. Discussed how he won't feel any relief but how this prevents crisis. Last seen at [facility] about a year ago. Stopped going due to difficulty getting transportation there and feeling like they weren't doing that much more for him there than here at the clinic.  Patient does not seem to be updated on his vaccines. [facility]ate recommends Strep pneumo, Hib, Neisseria, Hep B, and flu. Will discuss at next visit regarding this.  He will need to come in every 3 months for medication refills (will not do this by phone request). Patient expresses understanding.  Folic acid and a multivitamin are also recommended, but will not discuss at this visit since patient is not fully compliant with current medications.

## 2010-10-05 NOTE — Patient Instructions (Signed)
Please follow-up with me in 3 months (sometime in December).   Please take your hydroxyurea and enalapril daily.  Continue to stay well-hydrated.  It was nice to meet you. Good luck in school!

## 2010-10-05 NOTE — Progress Notes (Signed)
  Subjective:    Patient ID: [name] [name]., male    DOB: [birth year], 30 y.o.   MRN: [id number]  HPI Here for medication refills for narcotics.  Also has some lower back pain (where he typically get sickle cell pain) and requesting Toradol shot.  Minimal pain in legs (where he also usually gets sickle cell pain).  Drinks 4-5 bottles of water daily, more when he is more active.   Review of Systems Denies difficulty breathing, chest pain, wheezing, cough. Denies difficulty urinating, priapism, erection, hematuria. Denies pain in area of spleen. Denies vision changes.     Objective:   Physical Exam General: NAD Psych: pleasant, not agitated or depressed appearing, engaged CV: 2/6 SEM LUSB, no thrills Pulm: CTAB, no w/r/r Abd: soft, NT, ND Ext: no edema Back: mild TTP diffuse lower lumbar area Legs: no TTP    Assessment & Plan:
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
## 2010-11-02 LAB — COMPREHENSIVE METABOLIC PANEL
AST: 43 — ABNORMAL HIGH
Albumin: 3.3 — ABNORMAL LOW
Albumin: 3.4 — ABNORMAL LOW
BUN: 11
BUN: 5 — ABNORMAL LOW
BUN: 7
CO2: 21
CO2: 24
Calcium: 8.4
Calcium: 8.6
Chloride: 107
Chloride: 109
Creatinine, Ser: 0.89
Creatinine, Ser: 0.98
GFR calc Af Amer: >60
GFR calc non Af Amer: >60
GFR calc non Af Amer: >60
Glucose, Bld: 93
Total Bilirubin: 4.1 — ABNORMAL HIGH
Total Protein: 5.9 — ABNORMAL LOW
Total Protein: 6.1

## 2010-11-02 LAB — CULTURE, BLOOD (ROUTINE X 2)

## 2010-11-02 LAB — DIFFERENTIAL
Basophils Absolute: 0
Eosinophils Relative: 0
Lymphs Abs: 4.3 — ABNORMAL HIGH
Monocytes Absolute: 0.5
Monocytes Relative: 3
Neutrophils Relative %: 70

## 2010-11-02 LAB — CBC
HCT: 19.4 — ABNORMAL LOW
HCT: 20 — ABNORMAL LOW
HCT: 22.8 — ABNORMAL LOW
HCT: 23 — ABNORMAL LOW
HCT: 24 — ABNORMAL LOW
Hemoglobin: 7.1 — CL
Hemoglobin: 8 — ABNORMAL LOW
Hemoglobin: 8.3 — ABNORMAL LOW
MCHC: 34.5
MCHC: 34.9
MCHC: 35.3
MCHC: 36
MCV: 116.6 — ABNORMAL HIGH
MCV: 117.1 — ABNORMAL HIGH
MCV: 117.8 — ABNORMAL HIGH
MCV: 118 — ABNORMAL HIGH
MCV: 118.8 — ABNORMAL HIGH
Platelets: 196
Platelets: 272
RBC: 1.71 — ABNORMAL LOW
RBC: 1.71 — ABNORMAL LOW
RBC: 1.95 — ABNORMAL LOW
RBC: 2.02 — ABNORMAL LOW
RDW: 23 — ABNORMAL HIGH
RDW: 23.4 — ABNORMAL HIGH
RDW: 24.1 — ABNORMAL HIGH
RDW: 24.2 — ABNORMAL HIGH
RDW: 24.4 — ABNORMAL HIGH
RDW: 25.6 — ABNORMAL HIGH
WBC: 11.9 — ABNORMAL HIGH
WBC: 13.7 — ABNORMAL HIGH

## 2010-11-02 LAB — RETICULOCYTES
RBC.: 1.77 — ABNORMAL LOW
Retic Count, Absolute: 265.5 — ABNORMAL HIGH
Retic Ct Pct: 11.9 — ABNORMAL HIGH

## 2010-11-04 ENCOUNTER — Ambulatory Visit (INDEPENDENT_AMBULATORY_CARE_PROVIDER_SITE_OTHER): Payer: Medicare Other | Admitting: Family Medicine

## 2010-11-04 ENCOUNTER — Encounter: Payer: Self-pay | Admitting: Family Medicine

## 2010-11-04 ENCOUNTER — Inpatient Hospital Stay (HOSPITAL_COMMUNITY): Payer: Medicare Other

## 2010-11-04 ENCOUNTER — Inpatient Hospital Stay (HOSPITAL_COMMUNITY)
Admission: AD | Admit: 2010-11-04 | Discharge: 2010-11-07 | DRG: 812 | Disposition: A | Discharge status: Home / Self Care | Payer: Medicare Other | Source: Ambulatory Visit | Attending: Family Medicine | Admitting: Family Medicine

## 2010-11-04 VITALS — BP 126/77 | HR 119 | Temp 98.4°F | Ht 64.0 in | Wt 134.0 lb

## 2010-11-04 DIAGNOSIS — Z23 Encounter for immunization: Secondary | ICD-10-CM

## 2010-11-04 DIAGNOSIS — E039 Hypothyroidism, unspecified: Secondary | ICD-10-CM | POA: Diagnosis present

## 2010-11-04 DIAGNOSIS — D5700 Hb-SS disease with crisis, unspecified: Principal | ICD-10-CM | POA: Diagnosis present

## 2010-11-04 DIAGNOSIS — I739 Peripheral vascular disease, unspecified: Secondary | ICD-10-CM | POA: Diagnosis present

## 2010-11-04 DIAGNOSIS — Z79899 Other long term (current) drug therapy: Secondary | ICD-10-CM

## 2010-11-04 DIAGNOSIS — G43909 Migraine, unspecified, not intractable, without status migrainosus: Secondary | ICD-10-CM | POA: Diagnosis present

## 2010-11-04 DIAGNOSIS — D571 Sickle-cell disease without crisis: Secondary | ICD-10-CM

## 2010-11-04 DIAGNOSIS — N189 Chronic kidney disease, unspecified: Secondary | ICD-10-CM | POA: Diagnosis present

## 2010-11-04 LAB — COMPREHENSIVE METABOLIC PANEL
Albumin: 3.8 g/dL (ref 3.5–5.2)
BUN: 28 mg/dL — ABNORMAL HIGH (ref 6–23)
Calcium: 8.6 mg/dL (ref 8.4–10.5)
Chloride: 104 mEq/L (ref 96–112)
Creatinine, Ser: 1.86 mg/dL — ABNORMAL HIGH (ref 0.50–1.35)
Total Bilirubin: 3.4 mg/dL — ABNORMAL HIGH (ref 0.3–1.2)
Total Protein: 6.6 g/dL (ref 6.0–8.3)

## 2010-11-04 LAB — CBC
Hemoglobin: 7.7 g/dL — ABNORMAL LOW (ref 13.0–17.0)
RBC: 1.89 MIL/uL — ABNORMAL LOW (ref 4.22–5.81)

## 2010-11-04 MED ORDER — KETOROLAC TROMETHAMINE 60 MG/2ML IM SOLN
60.0000 mg | Freq: Once | INTRAMUSCULAR | Status: AC
  Administered 2010-11-04: 60 mg via INTRAMUSCULAR

## 2010-11-04 NOTE — Progress Notes (Addendum)
[facility] Admission History and Physical  Patient name: [name] [name]. Medical record number: [id number] Date of birth: March 13, 1980 Age: 30 y.o. Gender: male  Primary Care Provider: Karen Kays, MD  Chief Complaint: Pain crisis History of Present Illness: [name] [name]. is a 30 y.o. year old male presenting with a sickle cell pain crisis.  He started having increasing bilateral leg and chest pain 2 days ago. He has been taking his OxyContin 40 mg twice a day and his Percocet 10-325 every 6 hours. This has not helped much. He thinks the arrival the cold weather set his pain crisis off. He denies any shortness of breath.  His pain is substernal but does not radiate.  He has been able to eat and drink. No fevers or chills.  Patient Active Problem List  Diagnoses  . Sickle cell disease, type SS  . MIGRAINE, COMMON  . CHRONIC KIDNEY DISEASE STAGE II (MILD)  . HYPOTHYROIDISM  . PERIPHERAL VASCULAR DISEASE   Past Medical History: Past Medical History  Diagnosis Date  . Bacterial pneumonia     Past Surgical History: No past surgical history on file.  Social History: History   Social History  . Marital Status: Single    Spouse Name: N/A    Number of Children: N/A  . Years of Education: N/A   Social History Main Topics  . Smoking status: Former Smoker    Quit date: 03/04/2010  . Smokeless tobacco: Never Used  . Alcohol Use: No  . Drug Use: None  . Sexually Active: None   Other Topics Concern  . None   Social History Narrative   Going through divorce. Lives alone in [place].Back at school, studying Public relations account executive. Unemployed. Denies alcohol, marijuana, cocaine, heroine, or other drugs.     Family History: Noncontributory Allergies: Morphine give a headache.   Current Outpatient Prescriptions  Medication Sig Dispense Refill  . cetirizine (ZYRTEC) 10 MG tablet Take 1 tablet (10 mg total) by mouth daily.  30  tablet  11  . enalapril (VASOTEC) 2.5 MG tablet Take 3 tablets (7.5 mg total) by mouth daily.  30 tablet  3  . fluticasone (FLONASE) 50 MCG/ACT nasal spray 2 sprays by Nasal route daily.        . hydroxyurea (HYDREA) 500 MG capsule Take 3 capsules (1,500 mg total) by mouth daily.  90 capsule  3  . oxyCODONE (OXYCONTIN) 40 MG 12 hr tablet Take 1 tablet (40 mg total) by mouth every 12 (twelve) hours. Fill starting on 12/10/10  60 tablet  0  . oxyCODONE-acetaminophen (PERCOCET) 10-325 MG per tablet Take 1 tablet by mouth every 6 (six) hours as needed for pain. Fill starting on 12/10/10  80 tablet  0   Review Of Systems: Per HPI Otherwise 12 point review of systems was performed and was unremarkable.  Physical Exam:            Exam:  BP 126/77  Pulse 119  Temp(Src) 98.4 F (36.9 C) (Oral)  Ht 5\' 4"  (1.626 m)  Wt 134 lb (60.782 kg)  BMI 23.00 kg/m2  SpO2 96%  Gen: In pain appearing lying in exam room table HEENT: EOMI,  MMM Lungs: CTABL Nl WOB Heart: RRR no systolic flow murmur nonradiating Abd: NABS, NT, ND Exts: Non edematous BL  LE, warm and well perfused. Tender to the touch in the thighs and lower legs bilaterally.   Labs and Imaging: No labs yet available  Assessment and Plan: [name] [name]. is a 30 y.o. year old male presenting with pain crisis 1. pain crisis: This is a typical pain crisis for [name]. Ask. His pain escalated and has been uncontrollable with his oral outpatient medications.  He requested admission to the hospital. I agree and will admit the patient to telemetry bed.  We'll provide IV fluids and IV pain medications.  Will use a Dilaudid PCA that has worked in the past . [name] [name] also use IV Toradol.  Will also assess for acute chest syndrome with chest x-ray and EKG upon arrival to the hospital.  While waiting for transportation we'll provide patient with IM Toradol. Inpatient team will see patient upon arrival and modify plans.  2. hypertension: Well  controlled on enalapril 7.5. We'll continue ACE inhibitor and check basic metabolic panel. 3. sickle cell disease: We will check a hemoglobin upon arrival. His significantly low consider transfusion. I wrote to type and screen 1 unit of PRBCs  4 FEN/GI: Will use normal saline at 100 mL an hour.  Normal diet 5 Prophylaxis: Heparin and Protonix 6 Disposition: We will follow pain management when  tolerate an oral regimen will consider discharge.  [id number]   I interviewed and examined [name] [name] and discussed his case with Dr Georgina Snell and agree with his Assessment and Plan. Would treat for SSC and check CXR to r/o acute chest and check for worsening anemia and treat for pain [name] [name],[name] [name]

## 2010-11-05 ENCOUNTER — Inpatient Hospital Stay (HOSPITAL_COMMUNITY): Payer: Medicare Other

## 2010-11-05 DIAGNOSIS — D5700 Hb-SS disease with crisis, unspecified: Secondary | ICD-10-CM

## 2010-11-05 LAB — CBC
HCT: 18.3 % — ABNORMAL LOW (ref 39.0–52.0)
Hemoglobin: 8.6 g/dL — ABNORMAL LOW (ref 13.0–17.0)
MCH: 37.1 pg — ABNORMAL HIGH (ref 26.0–34.0)
RBC: 2.32 MIL/uL — ABNORMAL LOW (ref 4.22–5.81)
RDW: 21.8 % — ABNORMAL HIGH (ref 11.5–15.5)
WBC: 15 10*3/uL — ABNORMAL HIGH (ref 4.0–10.5)

## 2010-11-05 LAB — RETICULOCYTES
RBC.: 2.32 MIL/uL — ABNORMAL LOW (ref 4.22–5.81)
Retic Count, Absolute: 482.6 10*3/uL — ABNORMAL HIGH (ref 19.0–186.0)
Retic Ct Pct: 20.8 % — ABNORMAL HIGH (ref 0.4–3.1)

## 2010-11-05 LAB — MRSA PCR SCREENING: MRSA by PCR: NEGATIVE

## 2010-11-05 LAB — BASIC METABOLIC PANEL
BUN: 28 mg/dL — ABNORMAL HIGH (ref 6–23)
Chloride: 109 mEq/L (ref 96–112)
GFR calc Af Amer: >60 mL/min (ref >60)
Potassium: 5.2 mEq/L — ABNORMAL HIGH (ref 3.5–5.1)

## 2010-11-05 LAB — PREPARE RBC (CROSSMATCH)

## 2010-11-06 LAB — CROSSMATCH
ABO/RH(D): O POS
Antibody Screen: NEGATIVE
Donor AG Type: NEGATIVE
Unit division: 0

## 2010-11-06 LAB — BASIC METABOLIC PANEL
CO2: 21 mEq/L (ref 19–32)
Chloride: 110 mEq/L (ref 96–112)
Potassium: 5.4 mEq/L — ABNORMAL HIGH (ref 3.5–5.1)
Sodium: 138 mEq/L (ref 135–145)

## 2010-11-06 LAB — CBC
Platelets: 208 10*3/uL (ref 150–400)
RBC: 2.4 MIL/uL — ABNORMAL LOW (ref 4.22–5.81)
WBC: 12.8 10*3/uL — ABNORMAL HIGH (ref 4.0–10.5)

## 2010-11-07 ENCOUNTER — Encounter: Payer: Self-pay | Admitting: Family Medicine

## 2010-11-07 LAB — CBC
HCT: 22.5 % — ABNORMAL LOW (ref 39.0–52.0)
MCV: 99.1 fL (ref 78.0–100.0)
RBC: 2.27 MIL/uL — ABNORMAL LOW (ref 4.22–5.81)
WBC: 9.7 10*3/uL (ref 4.0–10.5)

## 2010-11-07 LAB — BASIC METABOLIC PANEL
BUN: 28 mg/dL — ABNORMAL HIGH (ref 6–23)
CO2: 18 mEq/L — ABNORMAL LOW (ref 19–32)
Chloride: 110 mEq/L (ref 96–112)
Creatinine, Ser: 1.62 mg/dL — ABNORMAL HIGH (ref 0.50–1.35)
Potassium: 5 mEq/L (ref 3.5–5.1)

## 2010-11-10 ENCOUNTER — Emergency Department (HOSPITAL_COMMUNITY)
Admission: EM | Admit: 2010-11-10 | Discharge: 2010-11-10 | Disposition: A | Discharge status: Home / Self Care | Payer: No Typology Code available for payment source | Attending: Emergency Medicine | Admitting: Emergency Medicine

## 2010-11-10 DIAGNOSIS — D571 Sickle-cell disease without crisis: Secondary | ICD-10-CM | POA: Insufficient documentation

## 2010-11-10 DIAGNOSIS — M549 Dorsalgia, unspecified: Secondary | ICD-10-CM | POA: Insufficient documentation

## 2010-11-10 DIAGNOSIS — Y92410 Unspecified street and highway as the place of occurrence of the external cause: Secondary | ICD-10-CM | POA: Insufficient documentation

## 2010-11-10 LAB — DIFFERENTIAL
Basophils Absolute: 0
Basophils Relative: 0
Lymphs Abs: 5.1 — ABNORMAL HIGH
Monocytes Relative: 5
Neutro Abs: 7.2

## 2010-11-10 LAB — CBC
HCT: 21 — ABNORMAL LOW
Hemoglobin: 7.3 — CL
MCV: 103.3 — ABNORMAL HIGH
Platelets: 140 — ABNORMAL LOW
WBC: 13.4 — ABNORMAL HIGH

## 2010-11-10 LAB — RETICULOCYTES: Retic Count, Absolute: 279.7 — ABNORMAL HIGH

## 2010-11-11 ENCOUNTER — Other Ambulatory Visit: Payer: Self-pay | Admitting: Family Medicine

## 2010-11-11 MED ORDER — ENALAPRIL MALEATE 2.5 MG PO TABS: 7.5000 mg | ORAL_TABLET | Freq: Every day | ORAL | Status: DC

## 2010-11-17 NOTE — Discharge Summary (Signed)
Physician Discharge Summary  Patient ID: [name] [name] MRN: [id number] DOB/AGE: 05/15/1980 30 y.o.  Admit date: 11/04/10 Discharge date: 11/07/10  Attending Physician: Patrick Jupiter A. Walker Kehr, M.D.  PCP: Jillene Bucks, M.D.  Admission Diagnoses: Sickle Cell Pain Crisis  Discharge Diagnoses:  1. Sickle Cell Pain Crisis, resolved 2. Acute on Chronic Kidney Injury  3. Hypertension    Discharged Condition: good  Hospital Course: 1. Pain Crisis: [name] is a 30 year old AAM with type SS SCD who presented in an acute pain crisis on 9/21. At the time of admission his Hb was 7.7 and he had pain in his chest, lower back and thighs bilaterally. He was afebrile and had no O2 requirement. A chest X-ray ruled out ACS. The patient was started on dilaudid PCA, toradol 30 mg IV q 6  and hydroxyurea 1500 mg daily. On hospital day 2 the patient Hb decreased to 6.7, therefore he was transfused 2 units of PRBC's. Thereafter, [name]'s Hb increased to 8.6. On the 9/23 the patient was transition to oral pain medications, specifically his home doses of MS Contin and Percocet PRN. By 9/24, the patient's pain had diminished to a level that he felt was manageable at home.   2. Acute on Chronic Kidney Injury: Upon admission the patient's creatinine was 1.86, from a baseline of 1.3. This decreased with IV fluid, but was still elevated to 1.62 on the day of discharge. For this reason the patient's ACE Inhibitor was held until he follows up with his PCP.   Consults: none  Significant Diagnostic Studies:  Chest Xray   IMPRESSION:   No acute abnormalities.   Mild chronic accentuation of heart size with pulmonary vascular   congestion.   Minimal chronic bronchitic changes. Hb: 7.7 --> 6.7 --> 8.6 Cr: 1.86 --> 1.57 --> 1.62  Treatments: PRBC's, Morphine PCA   Discharge Medications: 1. Hydroxyurea 500mg  po BID 2. Nortriptyline 25 mg po daily 3. Oxycodone CR 40 mg po BID 4. Percocet 10/325 mg po  BID  Holding Medications: Enalapril 2.5 mg secondary to concern for renal insufficiency   Discharge Exam: There were no vitals taken for this visit. General appearance: alert, cooperative and no distress Back: No tenderness along spine Chest wall: no tenderness GI: No splenomegaly Eyes: scleral icterus bilaterally   Disposition: Home or Self Care      Signed: Dewain Penning 11/17/2010, 4:09 PM

## 2010-11-25 LAB — TYPE AND SCREEN: Donor AG Type: NEGATIVE

## 2010-11-25 LAB — RETICULOCYTES
RBC.: 1.57 — ABNORMAL LOW
RBC.: 1.85 — ABNORMAL LOW
RBC.: 2.75 — ABNORMAL LOW
RBC.: 2.91 — ABNORMAL LOW
Retic Count, Absolute: 106.8
Retic Count, Absolute: 170.2
Retic Count, Absolute: 261.3 — ABNORMAL HIGH
Retic Count, Absolute: 267.7 — ABNORMAL HIGH
Retic Ct Pct: 6.8 — ABNORMAL HIGH
Retic Ct Pct: 9.2 — ABNORMAL HIGH
Retic Ct Pct: 9.2 — ABNORMAL HIGH
Retic Ct Pct: 9.5 — ABNORMAL HIGH

## 2010-11-25 LAB — CBC
HCT: 19.1 — ABNORMAL LOW
HCT: 21.3 — ABNORMAL LOW
HCT: 28.7 — ABNORMAL LOW
Platelets: 120 — ABNORMAL LOW
Platelets: 156
Platelets: 160
Platelets: 175
RDW: 19.3 — ABNORMAL HIGH
RDW: 29.3 — ABNORMAL HIGH
RDW: 30.4 — ABNORMAL HIGH
WBC: 10.8 — ABNORMAL HIGH
WBC: 12.9 — ABNORMAL HIGH
WBC: 9.5
WBC: 9.8

## 2010-11-25 LAB — POCT CARDIAC MARKERS
CKMB, poc: <1 — ABNORMAL LOW
Myoglobin, poc: 76.6
Operator id: 133351
Troponin i, poc: <0.05

## 2010-11-25 LAB — POCT I-STAT CREATININE: Operator id: 198171

## 2010-11-25 LAB — DIFFERENTIAL
Basophils Absolute: 0.1
Eosinophils Absolute: 0.3
Lymphocytes Relative: 47 — ABNORMAL HIGH
Monocytes Relative: 13 — ABNORMAL HIGH
Neutrophils Relative %: 37 — ABNORMAL LOW

## 2010-11-25 LAB — HEMOGLOBIN AND HEMATOCRIT, BLOOD
HCT: 18.8 — ABNORMAL LOW
Hemoglobin: 6.7 — CL

## 2010-11-25 LAB — I-STAT 8, (EC8 V) (CONVERTED LAB)
Acid-base deficit: 5 — ABNORMAL HIGH
BUN: 24 — ABNORMAL HIGH
Glucose, Bld: 93
HCT: 19 — ABNORMAL LOW
Sodium: 135
pH, Ven: 7.292

## 2010-11-30 NOTE — Discharge Summary (Signed)
[name] [name], WITTE NO.:  [id number]  MEDICAL RECORD NO.:  [id number]  LOCATION:  79                         FACILITY:  [facility]  PHYSICIAN:  Linn A. Walker Kehr, M.D.    DATE OF BIRTH:  04-27-1980  DATE OF ADMISSION:  11/04/2010 DATE OF DISCHARGE:  11/07/2010                              DISCHARGE SUMMARY   PRIMARY CARE PHYSICIAN:  Karen Kays, MD, of [facility]  ADMISSION DIAGNOSIS:  Sickle cell pain crisis.  DISCHARGE DIAGNOSES: 1. Sickle cell pain crisis resolved. 2. Acute on chronic kidney injury. 3. Hypertension.  DISCHARGE CONDITION:  Good.  HOSPITAL COURSE: 1. Pain crisis.  [name] is a 30 year old African American male     type SS sickle cell disease who presented in an acute pain crisis     on November 04, 2010.  At the time of his admission his hemoglobin     was 7.7 and he had pain in his chest, lower back and thighs     bilaterally.  He was afebrile and had no O2 requirement.  The chest     x-ray ruled out acute chest syndrome.  The patient was started on     Dilaudid PCA, Toradol 30 mg IV q.6, and hydroxyurea 1500 mg daily.     On hospital day #2, the patient's hemoglobin decreased to 6.7,     therefore he was transfused 2 units of packed red blood cells.     Thereafter Mr. Hendricks's hemoglobin increased to 8.6.  On     September 23, the patient was transitioned to oral pain     medications, specifically his home doses of MS Contin and Percocet     p.r.n.  On September 24, the patient's pain had diminished to a     level where he felt was manageable at home. 2. Acute on chronic kidney injury.  Upon admission the patient's     creatinine is 1.86 with a baseline of 1.3.  This decreased with IV     fluid, but was still elevated to 1.62 on day of discharge.  For     this reason, the patient's ACE inhibitor was held until he follows     up with his PCP.  CONSULTS:  None.  SIGNIFICANT DIAGNOSTIC STUDIES:  Chest x-ray.   Impression, no acute abnormalities.  Mild chronic accentuation of heart size with pulmonary vascular congestion.  Minimal chronic bronchitic changes.  STUDY:  Hemoglobin trend 7.7/6.7/6.8.  Creatinine trend 1.86/1.57/1.62.  TREATMENTS:  Packed red blood cells, Dilaudid PCA  DISCHARGE MEDICATIONS: 1. Hydroxyurea 500 mg p.o. b.i.d. 2. Nortriptyline 25 mg p.o. daily. 3. Oxycodone CR 40 mg p.o. b.i.d. 4. Percocet 10/325 mg p.o. b.i.d.  HOLDING MEDICATIONS:  Enalapril 2.5 mg secondary to concern for renal insufficiency.  DISCHARGE EXAM:  General appearance, alert, cooperative, in no distress. Back, no tenderness.  Lungs, fine chest wall, no tenderness.  GI, no splenomegaly.  Eyes, scleral icterus bilaterally.  DISPOSITION:  Discharged to home.  Follow up primary care physician, Dr. Sheral [place] at [facility] within 7 days.    ______________________________ Dewain Penning, MD   ______________________________ Arty Baumgartner. Walker Kehr, M.D.  EW/MEDQ  D:  11/17/2010  T:  11/17/2010  Job:  [id number]  Electronically Signed by Dewain Penning  on 11/27/2010 09:28:45 PM Electronically Signed by Candelaria Celeste M.D. on 11/30/2010 10:15:50 PM

## 2010-12-07 ENCOUNTER — Telehealth: Payer: Self-pay | Admitting: *Deleted

## 2010-12-07 NOTE — Telephone Encounter (Signed)
Pt came into office inquiring why his Oxycontin and oxycodone could not be filled until 12/10/10.  Saw where pt was seen on 8.22.12 and given these Rxs.  Consulted with Dr. Verdie Drown about this, she agrees that she meant to put 12/05/2010 on the script instead, asked that I get another MD to write as she is not in the office.  Pt had dropped the Rxs off at [facility] on [address] after his appt on 8.22.12.  Called and cancelled to refills that are dated for 10.27.12.  Dr. Lindell Noe to write Rx to be filled today.  Pt made appt today for 11.21.12 as he will need refills on the meds. Remmington Teters, Salome Spotted

## 2010-12-08 NOTE — H&P (Signed)
NAME:  DASAN, ALMONTE NO.:  [id number]  MEDICAL RECORD NO.:  [id number]  LOCATION:                                 FACILITY:  PHYSICIAN:  Talbert Cage, M.D.DATE OF BIRTH:  Oct 23, 1980  DATE OF ADMISSION:  11/04/2010 DATE OF DISCHARGE:                             HISTORY & PHYSICAL   PRIMARY CARE PROVIDER:  Karen Kays, MD at [facility].  CHIEF COMPLAINT:  Pain crisis.  HISTORY OF PRESENT ILLNESS:  Mr. Ander is a 30 year old male who presents with pain crisis, started having increasing bilateral leg and chest pain 2 days ago, he has been taking his oxycodone 40 mg twice a day and Percocet 10 mg every 6 hours, this does not help much.  He thinks the arrival of the cold weather set off his pain crisis.  He denies any shortness of breath and notes that his pain is substernal, but does not radiate.  He has been able to eat and drink.  He has no fevers or chills.  He says this is typical for his pain crisis.  MEDICAL HISTORY:  Hemoglobin SS disease, migraines, chronic kidney disease, hypothyroidism, peripheral vascular disease, has a history of pneumonia.  PAST SURGICAL HISTORY:  None.  SOCIAL HISTORY:  Single.  Former smoker.  No drugs or alcohol.  FAMILY HISTORY:  Noncontributory.  ALLERGIES:  MORPHINE gives him a headache.  MEDICATIONS: 1. Zyrtec 10 daily. 2. Enalapril 7.5 daily. 3. Flonase 2 sprays each nostril daily. 4. Hydroxyurea 1500 mg daily. 5. OxyContin 40 twice a day. 6. Percocet 10 every 6 hours.  REVIEW OF SYSTEMS:  Please see HPI, otherwise is normal.  PHYSICAL EXAMINATION:  VITAL SIGNS:  Blood pressure is 126/77, pulse 119, her temperature 98.4, sat is 96% on room air, respiratory rate is about 22. GENERAL:  In pain appearing, lying on exam room table. HEENT:  Moist mucous membranes.  EOMI. LUNGS:  Clear with normal work of breathing. HEART:  Regular rate and rhythm.  He has got a soft systolic  flow murmur that does not radiate. ABDOMEN:  Normoactive bowel sounds, nontender, nondistended. EXTREMITIES:  Nonedematous bilateral lower extremities, warm and well- perfused, tender to the touch in thighs and lower legs bilaterally.  His labs and studies, yet available.  ASSESSMENT/PLAN:  Mr. Grossberg is a 30 year old male with more pain crisis.  This is a typical pain crisis for Mr. Galindo.  His pain escalating, that has been uncontrollable with his oral outpatient medications.  He requests hospital admission, I agreed with the patient and we will admit the patient to a telemetry bed.  We will provide IV fluids, IV pain medications, received Dilaudid PCA as he says that worked in the past and IV Toradol.  We will assess for acute chest syndrome with chest x-ray and EKG upon arrival to the hospital.  We while awaiting for transportation, we will provide the patient with an IM Toradol.  We will only have morphine and that gives him a headache. Also, note the Inpatient Team see the patient upon arrival and modified plans as needed. 1. Hypertension, well-controlled with enalapril 7.5.  We will continue     ACE inhibitor and  check basic metabolic panel. 2. Sickle cell disease, we will check hemoglobin upon arrival, if     significantly low, we will consider transfusion.  I wrote to type     and screen 1 unit of red blood cells. 3. FEN/GI, received normal saline at 100 an hour and a normal diet. 4. Prophylaxis.  Heparin and Protonix. 5. Disposition.  We will followup pain management and when tolerating     an oral pain management we will consider discharge.     Lynne Leader, MD   ______________________________ Talbert Cage, M.D.    EC/MEDQ  D:  11/04/2010  T:  11/04/2010  Job:  [id number]  Electronically Signed by Lynne Leader  on 12/01/2010 07:47:18 AM Electronically Signed by Talbert Cage M.D. on 12/08/2010 02:42:56 PM

## 2011-01-04 ENCOUNTER — Encounter: Payer: Self-pay | Admitting: Family Medicine

## 2011-01-04 ENCOUNTER — Ambulatory Visit (INDEPENDENT_AMBULATORY_CARE_PROVIDER_SITE_OTHER): Payer: Medicare Other | Admitting: Family Medicine

## 2011-01-04 VITALS — BP 102/68 | HR 108 | Ht 64.0 in | Wt 133.0 lb

## 2011-01-04 DIAGNOSIS — D571 Sickle-cell disease without crisis: Secondary | ICD-10-CM

## 2011-01-04 DIAGNOSIS — D5700 Hb-SS disease with crisis, unspecified: Secondary | ICD-10-CM

## 2011-01-04 MED ORDER — OXYCODONE HCL 40 MG PO TB12: 40.0000 mg | ORAL_TABLET | Freq: Two times a day (BID) | ORAL | Status: DC

## 2011-01-04 MED ORDER — OXYCODONE-ACETAMINOPHEN 10-325 MG PO TABS: 1.0000 | ORAL_TABLET | Freq: Four times a day (QID) | ORAL | Status: DC | PRN

## 2011-01-04 MED ORDER — HYDROXYUREA 500 MG PO CAPS: 1500.0000 mg | ORAL_CAPSULE | Freq: Every day | ORAL | Status: DC

## 2011-01-04 MED ORDER — ENALAPRIL MALEATE 2.5 MG PO TABS: 7.5000 mg | ORAL_TABLET | Freq: Every day | ORAL | Status: DC

## 2011-01-04 NOTE — Progress Notes (Signed)
  Subjective:    Patient ID: [name] [name], male    DOB: 09-09-1980, 30 y.o.   MRN: [id number]  HPI Here for follow-up of sickle cell  Compliant: yes with ACEi and hydroxyurea Pain? Mild pain lower back and legs (where he usually has pain). Alleviated by percocet. Need prn: yes, about 2 a day, especially due to cold weather  Review of Systems Denies difficulty breathing    Objective:   Physical Exam Gen: NAD Psych: engaged, appropriate Eyes: ?scleral icterus (reports his eyes are usually this color) Oropharynx: MMM CV: RRR, no murmurs Pulm: no increased WOB, CTAB, no w/r/r, good air movement Ext: no edema MSK: back and legs non-tender    Assessment & Plan:

## 2011-01-04 NOTE — Assessment & Plan Note (Signed)
Stable. No pain. Using about 2 percocet daily.  Will give 3 months worth of narcotics.  Continue enalapril and hydroxyurea. Did not discuss vaccines today. Will address at next visit. Will also discuss need for folic acid.

## 2011-01-04 NOTE — Patient Instructions (Signed)
It was nice to see you again. I hope you don't give up on school.   Happy Thanksgiving.  Follow-up with me in 3 months.

## 2011-01-18 ENCOUNTER — Encounter: Payer: Self-pay | Admitting: Family Medicine

## 2011-01-18 ENCOUNTER — Ambulatory Visit
Admission: RE | Admit: 2011-01-18 | Discharge: 2011-01-18 | Disposition: A | Discharge status: Home / Self Care | Payer: Medicare Other | Source: Ambulatory Visit | Attending: Family Medicine | Admitting: Family Medicine

## 2011-01-18 ENCOUNTER — Ambulatory Visit (INDEPENDENT_AMBULATORY_CARE_PROVIDER_SITE_OTHER): Payer: Medicare Other | Admitting: Family Medicine

## 2011-01-18 DIAGNOSIS — D571 Sickle-cell disease without crisis: Secondary | ICD-10-CM

## 2011-01-18 DIAGNOSIS — J069 Acute upper respiratory infection, unspecified: Secondary | ICD-10-CM

## 2011-01-18 DIAGNOSIS — I10 Essential (primary) hypertension: Secondary | ICD-10-CM

## 2011-01-18 DIAGNOSIS — N182 Chronic kidney disease, stage 2 (mild): Secondary | ICD-10-CM

## 2011-01-18 DIAGNOSIS — M549 Dorsalgia, unspecified: Secondary | ICD-10-CM

## 2011-01-18 LAB — BASIC METABOLIC PANEL
BUN: 15 mg/dL (ref 6–23)
Chloride: 107 mEq/L (ref 96–112)
Creat: 1.27 mg/dL (ref 0.50–1.35)
Potassium: 4.2 mEq/L (ref 3.5–5.3)

## 2011-01-18 MED ORDER — IBUPROFEN 800 MG PO TABS: 800.0000 mg | ORAL_TABLET | Freq: Three times a day (TID) | ORAL | Status: AC | PRN

## 2011-01-18 MED ORDER — MUPIROCIN 2 % EX OINT: TOPICAL_OINTMENT | CUTANEOUS | Status: DC

## 2011-01-18 MED ORDER — KETOROLAC TROMETHAMINE 60 MG/2ML IM SOLN
60.0000 mg | Freq: Once | INTRAMUSCULAR | Status: AC
  Administered 2011-01-18: 60 mg via INTRAMUSCULAR

## 2011-01-18 NOTE — Assessment & Plan Note (Signed)
No clinical or pt reported signs of crisis. However, there is a lower threshold for treatment. Will check cbc today at pt's request. Retic count was discussed, but there was not enough blood to obtain sample (pt is very difficult to phlebotomize per lab discussion). Encouraged fluids. Will follow up in 48 hours. Red flags discussed.

## 2011-01-18 NOTE — Progress Notes (Signed)
  Subjective:    Patient ID: [name] [name], male    DOB: August 06, 1980, 30 y.o.   MRN: [id number]  HPI Patient is here for evaluation of URI symptoms. Patient has a baseline history of sickle cell disease. Patient states that he's noticed generalized malaise, rhinorrhea, nasal congestion, cough, as well as 2 episodes of mucus the vomiting. The symptoms have been present for 2-3 days. Patient reports he sets close sick contact with his girlfriend assessable symptoms. Patient states his symptoms are not like previous episodes of acute chest or sickle cell crises. Patient states he's been using his OxyContin as well as Percocet for pain. Patient states that he has been keeping himself hydrated over the same time frame.   Review of Systems See HPI     Objective:   Physical Exam Gen: up in chair, NAD HEENT: NCAT, EOMI, TMs clear bilaterally, mild scleral icterus bilaterally  CV: RRR, no murmurs auscultated PULM: CTAB, no wheezes, rales, rhoncii ABD: S/NT/+ bowel sounds  EXT: 2+ peripheral pulses         Assessment & Plan:

## 2011-01-18 NOTE — Assessment & Plan Note (Addendum)
Likely viral in etiology. Currently, there are no concerns for acute chest, or pneumonia. Given baseline history of sickle cell disease, there is a lower threshold for treatment. Will check a chest x-ray to rule out any new infiltrate. Will also check CBC to evaluate for new white count. Overall reasoning for this is to start early treatment if there is a bacterial process present such that an admission for VOC or PNA can be avoided if possible.   Will place pt on short course of NSAIDs as well as aggressive fluids.  Pt instructed to follow up in 2 days for general reassessment.  Red flags discussed. Pt agreeable to plan.

## 2011-01-18 NOTE — Assessment & Plan Note (Signed)
Instructed pt to hold ACEi while on NSAIDs as to decreased renal injury. Pt will follow up in 2 days. If pt symptomatically improved at that point, NSAIDs may be d/c'd andACE resumed. Will check Cr and K today. Encouraged aggressive hydration.

## 2011-01-18 NOTE — Patient Instructions (Addendum)
It was good to meet you today  I would like to get a chest xray to rule out a pneumonia  I am checking some labs today  Be sure to take ibuprofen for pain in conjunction with your other pain medication DRINK PLENTY OF FLUIDS Please follow up in 2 days for a follow up visit Call with any questions,  God Bless,  Shanda Howells MD

## 2011-01-19 LAB — CBC WITH DIFFERENTIAL/PLATELET
Basophils Absolute: 0 10*3/uL (ref 0.0–0.1)
Basophils Relative: 0 % (ref 0–1)
Lymphocytes Relative: 35 % (ref 12–46)
MCHC: 34 g/dL (ref 30.0–36.0)
Monocytes Absolute: 1.1 10*3/uL — ABNORMAL HIGH (ref 0.1–1.0)
Neutro Abs: 7.9 10*3/uL — ABNORMAL HIGH (ref 1.7–7.7)
Platelets: 289 10*3/uL (ref 150–400)
RDW: 24.2 % — ABNORMAL HIGH (ref 11.5–15.5)
WBC: 14.3 10*3/uL — ABNORMAL HIGH (ref 4.0–10.5)

## 2011-01-20 ENCOUNTER — Ambulatory Visit (INDEPENDENT_AMBULATORY_CARE_PROVIDER_SITE_OTHER): Payer: Medicare Other | Admitting: Family Medicine

## 2011-01-20 ENCOUNTER — Encounter: Payer: Self-pay | Admitting: Family Medicine

## 2011-01-20 VITALS — BP 129/84 | HR 82

## 2011-01-20 DIAGNOSIS — J069 Acute upper respiratory infection, unspecified: Secondary | ICD-10-CM

## 2011-01-20 NOTE — Assessment & Plan Note (Signed)
Resolving upper respiratory infection without any evidence of bacterial infection or acute chest. Followup as needed patient given red flags for return

## 2011-01-20 NOTE — Patient Instructions (Signed)
Im glad your doing better!  Please return if you notice worsening symptoms or other concerns

## 2011-01-20 NOTE — Progress Notes (Signed)
  Subjective:    Patient ID: [name] [name], male    DOB: 1980-11-14, 30 y.o.   MRN: [id number]  HPI Patient with sickle cell disease here for today in followup of upper respiratory infection.  His x-ray was reviewed which showed no definite infiltrate. White blood cell count was mildly elevated and hemoglobin was at his baseline.  Patient states he feels much better and has had no fevers and emesis has now resolved. He is able to keep down food and notes no dyspnea.   Review of Systems please see history of present illness     Objective:   Physical Exam GEN: Alert & Oriented, No acute distress, well appearing Neck: supple, no LAD CV:  Regular Rate & Rhythm, no murmur Respiratory:  Normal work of breathing, CTAB         Assessment & Plan:

## 2011-02-16 ENCOUNTER — Encounter: Payer: Self-pay | Admitting: Family Medicine

## 2011-02-16 ENCOUNTER — Ambulatory Visit (INDEPENDENT_AMBULATORY_CARE_PROVIDER_SITE_OTHER): Payer: Medicare Other | Admitting: Family Medicine

## 2011-02-16 VITALS — BP 125/75 | HR 96 | Ht 64.0 in | Wt 138.0 lb

## 2011-02-16 DIAGNOSIS — G43009 Migraine without aura, not intractable, without status migrainosus: Secondary | ICD-10-CM

## 2011-02-16 DIAGNOSIS — D571 Sickle-cell disease without crisis: Secondary | ICD-10-CM

## 2011-02-16 MED ORDER — ACETAMINOPHEN 325 MG PO TABS: 650.0000 mg | ORAL_TABLET | Freq: Two times a day (BID) | ORAL | Status: AC | PRN

## 2011-02-16 NOTE — Assessment & Plan Note (Signed)
History of migraine, followed by neurologist who he has not seen for a while and prescribed nortriptyline.  No headache today but complains of more frequent headaches. Usually 1 headache a week; now a couple of headaches a week.  Does not appear to be migrainous (no photophobia or nausea/vomiting) but still possible. Appears to be tension headache, less likely cluster headache. No worrisome focal neurological deficits or other red flags. Recommending Tylenol prn for now and suggested taking nortriptyline consistently (versus prn which he has been doing) next several days. Also advised follow-up with neurologist.

## 2011-02-16 NOTE — Patient Instructions (Signed)
If your lab results are normal, I will send you a letter with the results. If abnormal, someone at the clinic will get in touch with you.   Try Tylenol 650 mg up to twice a day as needed for your headaches. Take the nortriptyline every night for the next several days to see if this prevents headaches. Follow-up with your neurologist.  Follow-up with me for your regular appointment for your medicines.

## 2011-02-16 NOTE — Progress Notes (Signed)
  Subjective:    Patient ID: [name] [name], male    DOB: 28-Apr-1980, 31 y.o.   MRN: [id number]  HPI Acute visit for headache: None currently Has had a couple within the past few days, which is unusual for him. Lasts hours. Feels like pounding in temples bilaterally. Not associated with nausea or photophobia.  Has seen a neurologist for this. Given Rx for nortriptyline which he takes prn.   Has not taken anything else for his headaches.   Review of Systems Denies pain crisis symptoms.  Denies difficulty breathing.     Objective:   Physical Exam Gen: NAD Psych: engaged, appropriate Neuro: no deficits Pulm: NI WOB, CTAB without w/r/r    Assessment & Plan:

## 2011-02-17 LAB — CBC
Hemoglobin: 8.7 g/dL — ABNORMAL LOW (ref 13.0–17.0)
MCH: 40.5 pg — ABNORMAL HIGH (ref 26.0–34.0)
MCV: 115.3 fL — ABNORMAL HIGH (ref 78.0–100.0)
Platelets: 320 10*3/uL (ref 150–400)
RBC: 2.15 MIL/uL — ABNORMAL LOW (ref 4.22–5.81)
WBC: 13.9 10*3/uL — ABNORMAL HIGH (ref 4.0–10.5)

## 2011-02-19 ENCOUNTER — Encounter: Payer: Self-pay | Admitting: Family Medicine

## 2011-03-14 IMAGING — CR DG CHEST 1V PORT
1 series · 1 of 1 positions shown · non-contrast
Comparison: One-view chest x-ray 05/19/2009.  Two-view chest x-ray
03/06/2009 and 10/26/2008.

CLINICAL DATA: Sickle cell crisis.  Chest pain.

PORTABLE CHEST - 1 VIEW [DATE]/2822 8161 hours:

[series 1]
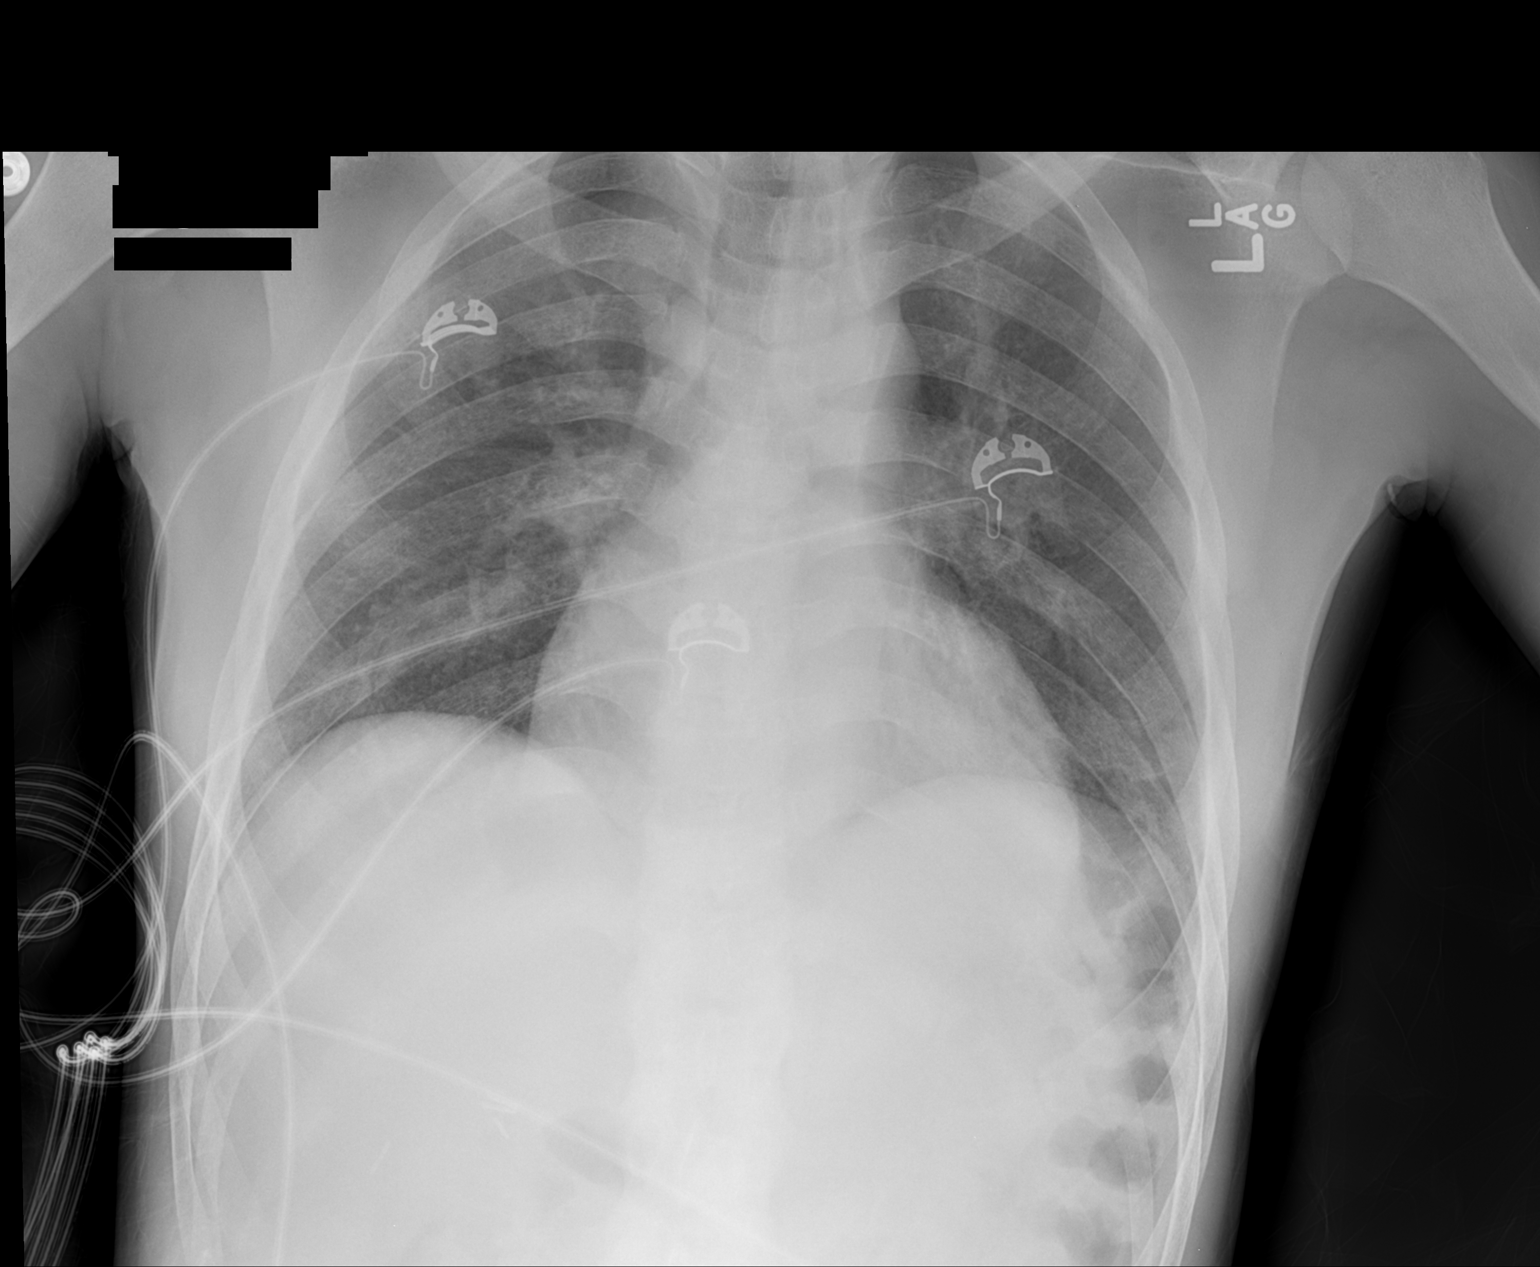

[1 of 1 positions shown; findings below may reference images not displayed]

FINDINGS: Inspiration suboptimal on the current examination,
accounting for atelectasis in the lung bases.  Lungs otherwise
clear.  Heart size upper normal for the AP portable technique,
unchanged.  Hilar and mediastinal contours otherwise unremarkable.
IMPRESSION: Suboptimal inspiration accounts for bibasilar atelectasis.  No
acute cardiopulmonary disease otherwise.

## 2011-03-20 ENCOUNTER — Telehealth: Payer: Self-pay | Admitting: Family Medicine

## 2011-03-20 ENCOUNTER — Ambulatory Visit (INDEPENDENT_AMBULATORY_CARE_PROVIDER_SITE_OTHER): Payer: Medicare Other | Admitting: Family Medicine

## 2011-03-20 ENCOUNTER — Encounter: Payer: Self-pay | Admitting: Family Medicine

## 2011-03-20 VITALS — BP 130/70 | HR 120 | Temp 100.1°F | Ht 64.0 in | Wt 133.2 lb

## 2011-03-20 DIAGNOSIS — J029 Acute pharyngitis, unspecified: Secondary | ICD-10-CM

## 2011-03-20 DIAGNOSIS — J039 Acute tonsillitis, unspecified: Secondary | ICD-10-CM

## 2011-03-20 LAB — BASIC METABOLIC PANEL
BUN: 24 mg/dL — ABNORMAL HIGH (ref 6–23)
CO2: 16 mEq/L — ABNORMAL LOW (ref 19–32)
Chloride: 108 mEq/L (ref 96–112)
Glucose, Bld: 84 mg/dL (ref 70–99)
Potassium: 5.4 mEq/L — ABNORMAL HIGH (ref 3.5–5.3)
Sodium: 136 mEq/L (ref 135–145)

## 2011-03-20 LAB — POCT RAPID STREP A (OFFICE): Rapid Strep A Screen: NEGATIVE

## 2011-03-20 MED ORDER — AMOXICILLIN-POT CLAVULANATE 875-125 MG PO TABS
1.0000 | ORAL_TABLET | Freq: Two times a day (BID) | ORAL | Status: AC
Start: 2011-03-20 — End: 2011-03-30

## 2011-03-20 MED ORDER — CEFTRIAXONE SODIUM 1 G IJ SOLR
1.0000 g | Freq: Once | INTRAMUSCULAR | Status: AC
  Administered 2011-03-20: 1 g via INTRAMUSCULAR

## 2011-03-20 NOTE — Progress Notes (Signed)
  Subjective:    Patient ID: [name] [name], male    DOB: 1980/11/23, 31 y.o.   MRN: [id number]  HPI  Patient here today with sore throat time 5 days. No fevers at home but has had sweats. Right-sided pain. Hurts to swallow. Is able to drink and eat a little bit. Has sickle cell disease which has not flared recently. Has appointment with [facility] to be seen at hematology. Review of Systems No nausea, vomiting. No ear pain.    Objective:   Physical Exam Gen.-uncomfortable appearing, alert HEENT-bilateral TMs appear full but not erythematous Throat with mild erythema right tonsil 2+ with drainage no uvular deviation, no trismus, no drooling Neck-tender under right mandible, dime-sized cervical lymph node, swelling present Heart-tachycardic normal rhythm       Assessment & Plan:

## 2011-03-20 NOTE — Telephone Encounter (Signed)
Called patient to discuss his earlier visit. He is willing to get a CT scan but is not willing to go today. He did say that if he had any difficulty with breathing, pain, other symptoms he would proceed to the emergency room. I will set up a CT scan for tomorrow. I will call him with the results. His phone number in the computer is the right number to return.

## 2011-03-20 NOTE — Patient Instructions (Signed)
I have sent in 14 days of antibiotics for you. Please come back and see [place] on Thursday to be checked again. Come back sooner if you can't swallow and keep yourself hydrated or if the pain is too bad.

## 2011-03-20 NOTE — Telephone Encounter (Signed)
Appt scheduled at [facility] radiology for tomorrow (03/20/10) at 3pm. Patient informed.

## 2011-03-20 NOTE — Telephone Encounter (Signed)
Called [name] [name] and left message for him to call the clinic. I would like for him to get a CT scan of his throat to make sure he does not have an abscess. I have put the order in and it can be scheduled as soon as he calls back.

## 2011-03-20 NOTE — Assessment & Plan Note (Signed)
Tonsillitis and possibly peritonsillar abscess. Complicated by sickle cell disease. Initially presented with external preceptor who felt that this was likely tonsillitis without abscess and should be treated with Rocephin IM in office and oral antibiotics as an outpatient. Patient was sent out with Augmentin. He was sent out with red flags return an appointment on Thursday. CBC and BMET were checked today. This case was reviewed later with Dr. Nori Riis who felt that he he warranted a CT scan. I have called patient and left a message to get him a CT scan.

## 2011-03-21 ENCOUNTER — Ambulatory Visit (HOSPITAL_COMMUNITY)
Admission: RE | Admit: 2011-03-21 | Discharge: 2011-03-21 | Disposition: A | Discharge status: Home / Self Care | Payer: Medicare Other | Source: Ambulatory Visit | Attending: Family Medicine | Admitting: Family Medicine

## 2011-03-21 ENCOUNTER — Encounter (HOSPITAL_COMMUNITY): Payer: Self-pay

## 2011-03-21 DIAGNOSIS — R070 Pain in throat: Secondary | ICD-10-CM | POA: Insufficient documentation

## 2011-03-21 DIAGNOSIS — R599 Enlarged lymph nodes, unspecified: Secondary | ICD-10-CM | POA: Insufficient documentation

## 2011-03-21 DIAGNOSIS — J039 Acute tonsillitis, unspecified: Secondary | ICD-10-CM

## 2011-03-21 LAB — CBC WITH DIFFERENTIAL/PLATELET
HCT: 21 % — ABNORMAL LOW (ref 39.0–52.0)
Hemoglobin: 7.5 g/dL — ABNORMAL LOW (ref 13.0–17.0)
Lymphocytes Relative: 23 % (ref 12–46)
Lymphs Abs: 4.8 10*3/uL — ABNORMAL HIGH (ref 0.7–4.0)
Monocytes Absolute: 2.4 10*3/uL — ABNORMAL HIGH (ref 0.1–1.0)
Monocytes Relative: 11 % (ref 3–12)
Neutro Abs: 13.6 10*3/uL — ABNORMAL HIGH (ref 1.7–7.7)
Neutrophils Relative %: 65 % (ref 43–77)
RBC: 1.88 MIL/uL — ABNORMAL LOW (ref 4.22–5.81)

## 2011-03-21 MED ORDER — IOHEXOL 300 MG/ML  SOLN
75.0000 mL | Freq: Once | INTRAMUSCULAR | Status: AC | PRN
  Administered 2011-03-21: 75 mL via INTRAVENOUS

## 2011-03-22 ENCOUNTER — Telehealth: Payer: Self-pay | Admitting: Family Medicine

## 2011-03-22 NOTE — Telephone Encounter (Signed)
Message copied by Judithann Sheen on Wed Mar 22, 2011  1:44 PM ------      Message from: Candelaria Celeste      Created: Tue Mar 21, 2011  5:32 PM                   ----- Message -----         From: Rad Results In Interface         Sent: 03/21/2011   3:49 PM           To: Schuyler Amor, MD

## 2011-03-22 NOTE — Telephone Encounter (Signed)
Called pt to tell him no abscess.  Left VM.  Follow up as planned or earlier if necessary

## 2011-03-23 ENCOUNTER — Ambulatory Visit (INDEPENDENT_AMBULATORY_CARE_PROVIDER_SITE_OTHER): Payer: Medicare Other | Admitting: Family Medicine

## 2011-03-23 ENCOUNTER — Encounter: Payer: Self-pay | Admitting: Family Medicine

## 2011-03-23 VITALS — BP 122/70 | HR 92 | Temp 99.2°F | Ht 64.0 in | Wt 133.4 lb

## 2011-03-23 DIAGNOSIS — D571 Sickle-cell disease without crisis: Secondary | ICD-10-CM

## 2011-03-23 DIAGNOSIS — N182 Chronic kidney disease, stage 2 (mild): Secondary | ICD-10-CM

## 2011-03-23 DIAGNOSIS — J039 Acute tonsillitis, unspecified: Secondary | ICD-10-CM

## 2011-03-23 DIAGNOSIS — D5700 Hb-SS disease with crisis, unspecified: Secondary | ICD-10-CM

## 2011-03-23 MED ORDER — OXYCODONE HCL 40 MG PO TB12: 40.0000 mg | ORAL_TABLET | Freq: Two times a day (BID) | ORAL | Status: DC

## 2011-03-23 MED ORDER — IBUPROFEN 600 MG PO TABS: 600.0000 mg | ORAL_TABLET | Freq: Three times a day (TID) | ORAL | Status: DC | PRN

## 2011-03-23 MED ORDER — OXYCODONE-ACETAMINOPHEN 10-325 MG PO TABS: 1.0000 | ORAL_TABLET | Freq: Four times a day (QID) | ORAL | Status: DC | PRN

## 2011-03-23 NOTE — Assessment & Plan Note (Signed)
Told patient to only use the Motrin sparingly and try to avoid it whenever at cost.

## 2011-03-23 NOTE — Assessment & Plan Note (Signed)
Patient appears to be doing very well but patient is running out of his pain medications refilled his pain medications at followup appointment with Dr. Verdie Drown in one month time to discuss further care.

## 2011-03-23 NOTE — Progress Notes (Signed)
  Subjective:    Patient ID: [name] [name], male    DOB: 11/10/1980, 31 y.o.   MRN: [id number]  HPI 31 year old male with sickle cell type SS disease coming in for followup for potential strep throat and concern for a peritonsillar abscess. Patient's CT scan was negative has been taking his Augmentin and improved considerably. Patient states he feels almost like himself still having a little tenderness of the throat. Patient is able eat and do his activities of daily living without any problems. No fevers or chills no pain in his extremities either. Patient also denies shortness of breath or chest pain.   Review of Systems As stated in history of present illness    Objective:   Physical Exam General: No apparent distress patient does have some scleral icterus at baseline HEENT tympanic membranes normal bilaterally patient's posterior pharynx still has a mild erythema tonsils are not enlarged patient does still have some mild lymphadenopathy of the anterior cervical chain. Pulmonary: Clear to auscultation bilaterally Cardiovascular: Regular in with no murmur    Assessment & Plan:

## 2011-03-23 NOTE — Progress Notes (Signed)
Addended by: Lyndal Pulley on: 03/23/2011 10:57 AM   Modules accepted: Orders

## 2011-03-23 NOTE — Assessment & Plan Note (Signed)
Resolving at this time told him to take the rest of his Augmentin followup as necessary.

## 2011-03-23 NOTE — Patient Instructions (Signed)
Good to see you. I refilled the pain medications and give me a little ibuprofen to take for the swelling of your throat. I made an appointment with Dr. Verdie Drown for you next month.

## 2011-04-18 ENCOUNTER — Encounter (HOSPITAL_COMMUNITY): Payer: Self-pay | Admitting: Emergency Medicine

## 2011-04-18 ENCOUNTER — Other Ambulatory Visit: Payer: Self-pay | Admitting: Sports Medicine

## 2011-04-18 ENCOUNTER — Ambulatory Visit (INDEPENDENT_AMBULATORY_CARE_PROVIDER_SITE_OTHER): Payer: Medicare Other | Admitting: Sports Medicine

## 2011-04-18 ENCOUNTER — Encounter: Payer: Self-pay | Admitting: Sports Medicine

## 2011-04-18 ENCOUNTER — Emergency Department (HOSPITAL_COMMUNITY)
Admission: EM | Admit: 2011-04-18 | Discharge: 2011-04-18 | Disposition: A | Discharge status: Home / Self Care | Payer: Medicare Other | Attending: Emergency Medicine | Admitting: Emergency Medicine

## 2011-04-18 VITALS — BP 99/65 | HR 96 | Temp 98.1°F | Ht 64.0 in | Wt 135.4 lb

## 2011-04-18 DIAGNOSIS — R42 Dizziness and giddiness: Secondary | ICD-10-CM

## 2011-04-18 DIAGNOSIS — D571 Sickle-cell disease without crisis: Secondary | ICD-10-CM

## 2011-04-18 DIAGNOSIS — E860 Dehydration: Secondary | ICD-10-CM

## 2011-04-18 DIAGNOSIS — D5700 Hb-SS disease with crisis, unspecified: Secondary | ICD-10-CM | POA: Insufficient documentation

## 2011-04-18 DIAGNOSIS — N182 Chronic kidney disease, stage 2 (mild): Secondary | ICD-10-CM

## 2011-04-18 DIAGNOSIS — E039 Hypothyroidism, unspecified: Secondary | ICD-10-CM

## 2011-04-18 DIAGNOSIS — Z87891 Personal history of nicotine dependence: Secondary | ICD-10-CM | POA: Insufficient documentation

## 2011-04-18 LAB — DIFFERENTIAL
Basophils Relative: 0 % (ref 0–1)
Eosinophils Relative: 5 % (ref 0–5)
Monocytes Absolute: 1.3 10*3/uL — ABNORMAL HIGH (ref 0.1–1.0)
Monocytes Relative: 10 % (ref 3–12)
Neutrophils Relative %: 34 % — ABNORMAL LOW (ref 43–77)

## 2011-04-18 LAB — URINE MICROSCOPIC-ADD ON

## 2011-04-18 LAB — CBC
Hemoglobin: 7.8 g/dL — ABNORMAL LOW (ref 13.0–17.0)
MCH: 42.6 pg — ABNORMAL HIGH (ref 26.0–34.0)
Platelets: 233 10*3/uL (ref 150–400)
RBC: 1.83 MIL/uL — ABNORMAL LOW (ref 4.22–5.81)
WBC: 13 10*3/uL — ABNORMAL HIGH (ref 4.0–10.5)

## 2011-04-18 LAB — COMPREHENSIVE METABOLIC PANEL
ALT: 57 U/L — ABNORMAL HIGH (ref 0–53)
AST: 84 U/L — ABNORMAL HIGH (ref 0–37)
Albumin: 3.8 g/dL (ref 3.5–5.2)
Alkaline Phosphatase: 169 U/L — ABNORMAL HIGH (ref 39–117)
CO2: 17 mEq/L — ABNORMAL LOW (ref 19–32)
Chloride: 105 mEq/L (ref 96–112)
Creatinine, Ser: 2.15 mg/dL — ABNORMAL HIGH (ref 0.50–1.35)
GFR calc non Af Amer: 40 mL/min — ABNORMAL LOW (ref >90)
Potassium: 4.5 mEq/L (ref 3.5–5.1)
Sodium: 134 mEq/L — ABNORMAL LOW (ref 135–145)
Total Bilirubin: 3.8 mg/dL — ABNORMAL HIGH (ref 0.3–1.2)

## 2011-04-18 LAB — URINALYSIS, ROUTINE W REFLEX MICROSCOPIC
Bilirubin Urine: NEGATIVE
Glucose, UA: NEGATIVE mg/dL
Ketones, ur: NEGATIVE mg/dL
Protein, ur: 100 mg/dL — AB
Urobilinogen, UA: 1 mg/dL (ref 0.0–1.0)

## 2011-04-18 LAB — RETICULOCYTES: Retic Ct Pct: 15.6 % — ABNORMAL HIGH (ref 0.4–3.1)

## 2011-04-18 MED ORDER — HYDROMORPHONE HCL PF 1 MG/ML IJ SOLN
1.0000 mg | Freq: Once | INTRAMUSCULAR | Status: AC
  Administered 2011-04-18: 1 mg via INTRAVENOUS
  Filled 2011-04-18: qty 1

## 2011-04-18 MED ORDER — ONDANSETRON HCL 4 MG/2ML IJ SOLN
4.0000 mg | Freq: Once | INTRAMUSCULAR | Status: AC
  Administered 2011-04-18: 4 mg via INTRAVENOUS
  Filled 2011-04-18: qty 2

## 2011-04-18 MED ORDER — HYDROMORPHONE HCL PF 2 MG/ML IJ SOLN
2.0000 mg | Freq: Once | INTRAMUSCULAR | Status: AC
  Administered 2011-04-18: 2 mg via INTRAVENOUS
  Filled 2011-04-18: qty 1

## 2011-04-18 MED ORDER — SODIUM CHLORIDE 0.9 % IV BOLUS (SEPSIS)
1000.0000 mL | Freq: Once | INTRAVENOUS | Status: AC
  Administered 2011-04-18: 1000 mL via INTRAVENOUS

## 2011-04-18 MED ORDER — KETOROLAC TROMETHAMINE 30 MG/ML IJ SOLN
30.0000 mg | Freq: Once | INTRAMUSCULAR | Status: AC
  Administered 2011-04-18: 30 mg via INTRAVENOUS
  Filled 2011-04-18: qty 1

## 2011-04-18 NOTE — Progress Notes (Signed)
HPI:  [name] [name] is a 31 y.o. male presenting today for evaluation of dizziness, lightheadedness, and headache X 1.5 days.  He reports this is typical for when his hemoglobin is low and/or he becomes dehydrated due to his SS disease.  No syncope or falls.  He does report that he has back pain that is typical for his SS disease pain crisis - has taken both his percocet and oxycontin and is still 7/10.     ROS  Constitutional Increased fatigue and anorexia X 2-3 days  Infectious No recent illnesses, no fevers, no chills - +sick contacts with nieces and nephnews  Resp No cough, no congestion  Cardiac No chest pain  GI No N/V constipation or diarrhea; no abdominal pain  GU  no changes in urinary habits - dark colored urine  Skin + subjective scleral icterus  MSK See above  Trauma None reported  Activity Decreased due to fatigue  Diet Taking 2-3L of gatorade per day as well as case of water   Past Medical Hx Reviewed: yes Medications Reviewed: yes Family History Reviewed: yes   PE: GENERAL:  Adult AA male examined in [facility].  In NAD, no resp distress. PSYCH: Alert and Appropriate - good insight into medical condition HNEENT:  H&N: supple, no adenopathy, thyroid abnormal revealing thyroid enlarged, smooth, nontender, no nodules. and trachea midline OROPHARYNX: lips, mucosa, and tongue normal; teeth and gums normal EYES: scleral icteric, EOMi,  NOSE: normal THORAX: HEART: RRR, S1/S2 heard, 2/6 systolic flow murmur LUNGS: CTA B in A&P fields ABDOMEN:  +BS, soft, non-tender, no rigidity, no guarding, no masses/organomegaly EXTREMITIES: Moves all 4 extremities spontaneously, warm well perfused, no edema, bilateral DP and PT pulses 2/4.

## 2011-04-18 NOTE — Patient Instructions (Signed)
Please go to the ED.  I will call and inform them you are on your way.    Please follow up with our clinic at the end of the week if you are no better.  If you are fine after the IV Fluids plan to see Dr. Verdie Drown in 1-2 weeks

## 2011-04-18 NOTE — Assessment & Plan Note (Signed)
Request TSH be checked

## 2011-04-18 NOTE — ED Provider Notes (Signed)
History     CSN: [id number]  Arrival date & time 04/18/11  1633   First MD Initiated Contact with Patient 04/18/11 1807     7:30 PM Patient reports he was at his PCPs office and advised him to come to the emergency room due to sickle cell pain crisis. Reports patient began having low back pain 3 days ago. States pain is typical for sickle cell pain. Patient reports jaundice denies began 2 days ago as well. Patient denies chest pain, fever, abdominal pain, lower pain, dysuria, frequency, hematuria, diarrhea, constipation.  Patient is a 31 y.o. male presenting with sickle cell pain. The history is provided by the patient.  Sickle Cell Pain Crisis  This is a chronic problem. The current episode started 3 to 5 days ago. The onset was gradual. The problem occurs continuously. The problem has been gradually worsening. Pain location: Lower. Site of pain is localized in bone. The pain is severe. The symptoms are relieved by nothing. Associated symptoms include back pain. Pertinent negatives include no chest pain, no blurred vision, no photophobia, no abdominal pain, no diarrhea, no nausea, no vomiting, no dysuria, no hematuria, no joint pain, no neck pain, no neck stiffness, no loss of sensation, no tingling, no weakness, no difficulty breathing, no rash and no eye pain. Associated symptoms comments: Jaundiced eyes. There is no swelling present.    Past Medical History  Diagnosis Date  . Bacterial pneumonia   . Anemia     SS  . Sickle cell anemia     History reviewed. No pertinent past surgical history.  No family history on file.  History  Substance Use Topics  . Smoking status: Former Smoker    Quit date: 03/04/2010  . Smokeless tobacco: Never Used  . Alcohol Use: No      Review of Systems  Constitutional: Negative for fever and chills.  HENT: Negative for neck pain.   Eyes: Negative for blurred vision, photophobia and pain.  Respiratory: Negative for shortness of breath.     Cardiovascular: Negative for chest pain.  Gastrointestinal: Negative for nausea, vomiting, abdominal pain and diarrhea.  Genitourinary: Negative for dysuria and hematuria.  Musculoskeletal: Positive for back pain. Negative for joint pain.  Skin: Negative for rash.  Neurological: Negative for tingling and weakness.  All other systems reviewed and are negative.    Allergies  Review of patient's allergies indicates no known allergies.  Home Medications   Current Outpatient Rx  Name Route Sig Dispense Refill  . ENALAPRIL MALEATE 2.5 MG PO TABS Oral Take 3 tablets (7.5 mg total) by mouth daily. 90 tablet 3  . HYDROXYUREA 500 MG PO CAPS Oral Take 3 capsules (1,500 mg total) by mouth daily. 90 capsule 3  . NORTRIPTYLINE HCL 25 MG PO CAPS Oral Take 25 mg by mouth at bedtime.    . OXYCODONE HCL ER 40 MG PO TB12 Oral Take 1 tablet (40 mg total) by mouth every 12 (twelve) hours. 60 tablet 0    Fill February  . OXYCODONE-ACETAMINOPHEN 10-325 MG PO TABS Oral Take 1 tablet by mouth every 6 (six) hours as needed for pain. 80 tablet 0    Fill February    BP 100/51  Pulse 105  Temp(Src) 98.3 F (36.8 C) (Oral)  Resp 19  SpO2 93%  Physical Exam  Constitutional: He is oriented to person, place, and time. He appears well-developed and well-nourished.  HENT:  Head: Normocephalic and atraumatic.  Eyes: Conjunctivae are normal. Pupils are equal,  round, and reactive to light.  Neck: Normal range of motion. Neck supple.  Cardiovascular: Normal rate, regular rhythm and normal heart sounds.   Pulmonary/Chest: Effort normal and breath sounds normal.  Abdominal: Soft. Bowel sounds are normal. He exhibits no distension and no mass. There is no tenderness. There is no rebound and no guarding.  Musculoskeletal: Normal range of motion. He exhibits no edema and no tenderness.       Lumbar back: Normal.  Neurological: He is alert and oriented to person, place, and time.  Skin: Skin is warm and dry. No  rash noted. No erythema. No pallor.  Psychiatric: He has a normal mood and affect. His behavior is normal.    ED Course  Procedures   Results for orders placed during the hospital encounter of 04/18/11  CBC      Component Value Range   WBC 13.0 (*) 4.0 - 10.5 (K/uL)   RBC 1.83 (*) 4.22 - 5.81 (MIL/uL)   Hemoglobin 7.8 (*) 13.0 - 17.0 (g/dL)   HCT 21.3 (*) 39.0 - 52.0 (%)   MCV 116.4 (*) 78.0 - 100.0 (fL)   MCH 42.6 (*) 26.0 - 34.0 (pg)   MCHC 36.6 (*) 30.0 - 36.0 (g/dL)   RDW 18.1 (*) 11.5 - 15.5 (%)   Platelets 233  150 - 400 (K/uL)  DIFFERENTIAL      Component Value Range   Neutrophils Relative 34 (*) 43 - 77 (%)   Lymphocytes Relative 51 (*) 12 - 46 (%)   Monocytes Relative 10  3 - 12 (%)   Eosinophils Relative 5  0 - 5 (%)   Basophils Relative 0  0 - 1 (%)   Neutro Abs 4.4  1.7 - 7.7 (K/uL)   Lymphs Abs 6.6 (*) 0.7 - 4.0 (K/uL)   Monocytes Absolute 1.3 (*) 0.1 - 1.0 (K/uL)   Eosinophils Absolute 0.7  0.0 - 0.7 (K/uL)   Basophils Absolute 0.0  0.0 - 0.1 (K/uL)   RBC Morphology SICKLE CELLS     Smear Review LARGE PLATELETS PRESENT    COMPREHENSIVE METABOLIC PANEL      Component Value Range   Sodium 134 (*) 135 - 145 (mEq/L)   Potassium 4.5  3.5 - 5.1 (mEq/L)   Chloride 105  96 - 112 (mEq/L)   CO2 17 (*) 19 - 32 (mEq/L)   Glucose, Bld 110 (*) 70 - 99 (mg/dL)   BUN 22  6 - 23 (mg/dL)   Creatinine, Ser 2.15 (*) 0.50 - 1.35 (mg/dL)   Calcium 8.8  8.4 - 10.5 (mg/dL)   Total Protein 6.9  6.0 - 8.3 (g/dL)   Albumin 3.8  3.5 - 5.2 (g/dL)   AST 84 (*) 0 - 37 (U/L)   ALT 57 (*) 0 - 53 (U/L)   Alkaline Phosphatase 169 (*) 39 - 117 (U/L)   Total Bilirubin 3.8 (*) 0.3 - 1.2 (mg/dL)   GFR calc non Af Amer 40 (*) >90 (mL/min)   GFR calc Af Amer 46 (*) >90 (mL/min)  URINALYSIS, ROUTINE W REFLEX MICROSCOPIC      Component Value Range   Color, Urine YELLOW  YELLOW    APPearance CLEAR  CLEAR    Specific Gravity, Urine 1.005  1.005 - 1.030    pH 6.5  5.0 - 8.0    Glucose, UA  NEGATIVE  NEGATIVE (mg/dL)   Hgb urine dipstick MODERATE (*) NEGATIVE    Bilirubin Urine NEGATIVE  NEGATIVE    Ketones, ur NEGATIVE  NEGATIVE (mg/dL)   Protein, ur 100 (*) NEGATIVE (mg/dL)   Urobilinogen, UA 1.0  0.0 - 1.0 (mg/dL)   Nitrite NEGATIVE  NEGATIVE    Leukocytes, UA NEGATIVE  NEGATIVE   RETICULOCYTES      Component Value Range   Retic Ct Pct 15.6 (*) 0.4 - 3.1 (%)   RBC. 1.83 (*) 4.22 - 5.81 (MIL/uL)   Retic Count, Manual 285.5 (*) 19.0 - 186.0 (K/uL)  URINE MICROSCOPIC-ADD ON      Component Value Range   Squamous Epithelial / LPF RARE  RARE    WBC, UA 0-2  <3 (WBC/hpf)   RBC / HPF 0-2  <3 (RBC/hpf)   Ct Soft Tissue Neck W Contrast  03/21/2011  *RADIOLOGY REPORT*  Clinical Data: Right throat pain.  Rule out peritonsillar abscess.  CT NECK WITH CONTRAST  Technique:  Multidetector CT imaging of the neck was performed with intravenous contrast.  Contrast: 73mL OMNIPAQUE IOHEXOL 300 MG/ML IV SOLN  Comparison: CT head and neck without contrast 03/28/2010.  Findings: Limited imaging brain demonstrates a prominent CSF space in the left middle cranial fossa.  Although volume loss has been suggested, I favor this as an arachnoid cyst.  There is a smaller arachnoid cyst on the right.  Asymmetric soft tissue enhancement is present in the right tonsillar and peritonsillar area.  There is some gas on the right which is likely within the folds of the right tonsil rather than within the parenchyma proper.  There is some mucosal enhancement extending to the right piriform recess.  The vocal cords are midline and symmetric.  Enlarged right level II lymph nodes are likely reactive.  There are smaller left sided lymph nodes, similar to the prior study.  These are likely within normal limits for age.  Focal fatty infiltration of the left parotid is also stable.  The lung apices are clear.  The bone windows are unremarkable.  IMPRESSION:  1.  Asymmetric prominence of right-sided soft tissue and enhancement  in the right tonsillar and peritonsillar area, compatible with acute inflammation. 2.  Associated gas is likely within tonsillar folds rather than representing a focal abscess.  The area should be amenable to direct visualization. 3.  Asymmetric right-sided level II lymph nodes are likely reactive. 4.  Stable smaller lymph nodes on the left. 5.  Probable arachnoid cysts as described.  Original Report Authenticated By: Resa Miner. MATTERN, M.D.     MDM   8:20 PM Pain mildly improved with meds. Will order 1 mg dilaudid.  10:52 PM Reports significant improvement of symptoms. States his pain is now a 5/10 and he is ready for discharge.        Sheliah Mends, PA-C 04/18/11 2258

## 2011-04-18 NOTE — ED Notes (Addendum)
Pt sent to ED from MD office ref. Sickle cell pain.  Pt c/o pain in lower back.  Pt's eyes are jaundice

## 2011-04-18 NOTE — Assessment & Plan Note (Addendum)
Pt appears mildly clinically dry.  Relates that his current symptoms are typical for when he becomes dehydrated.  Pt requesting IV fluids. . . Pt tachycardic with BP 102/67.  Will obtain orthostatics and plan to send to ED for IV hydration as failed out pt oral rehydration.  Will check BMET - concerned re CKD  - Negative Orthostatics

## 2011-04-18 NOTE — Assessment & Plan Note (Signed)
Will request BMET in ED be checked

## 2011-04-18 NOTE — Discharge Instructions (Signed)
Sickle Cell Pain Crisis Sickle cell anemia requires regular medical attention by your healthcare provider and awareness about when to seek medical care. Pain is a common problem in children with sickle cell disease. This usually starts at less than 29 year of age. Pain can occur nearly anywhere in the body but most commonly occurs in the extremities, back, chest, or belly (abdomen). Pain episodes can start suddenly or may follow an illness. These attacks can appear as decreased activity, loss of appetite, change in behavior, or simply complaints of pain. DIAGNOSIS   Specialized blood and gene testing can help make this diagnosis early in the disease. Blood tests may then be done to watch blood levels.   Specialized brain scans are done when there are problems in the brain during a crisis.   Lung testing may be done later in the disease.  HOME CARE INSTRUCTIONS   Maintain good hydration. Increase you or your child's fluid intake in hot weather and during exercise.   Avoid smoking. Smoking lowers the oxygen in the blood and can cause the production of sickle-shaped cells (sickling).   Control pain. Only take over-the-counter or prescription medicines for pain, discomfort, or fever as directed by your caregiver. Do not give aspirin to children because of the association with Reye's syndrome.   Keep regular health care checks to keep a proper red blood cell (hemoglobin) level. A moderate anemia level protects against sickling crises.   You and your child should receive all the same immunizations and care as the people around you.   Mothers should breastfeed their babies if possible. Use formulas with iron added if breastfeeding is not possible. Additional iron should not be given unless there is a lack of it. People with sickle cell disease (SCD) build up iron faster than normal. Give folic acid and additional vitamins as directed.   If you or your child has been prescribed antibiotics or other  medications to prevent problems, take them as directed.   Summer camps are available for children with SCD. They may help young people deal with their disease. The camps introduce them to other children with the same problem.   Young people with SCD may become frustrated or angry at their disease. This can cause rebellion and refusal to follow medical care. Help groups or counseling may help with these problems.   Wear a medical alert bracelet. When traveling, keep medical information, caregiver's names, and the medications you or your child takes with you at all times.  SEEK IMMEDIATE MEDICAL CARE IF:   You or your child develops dizziness or fainting, numbness in or difficulty with movement of arms and legs, difficulty with speech, or is acting abnormally. This could be early signs of a stroke. Immediate treatment is necessary.   You or your child has an oral temperature above 102 F (38.9 C), not controlled by medicine.   You or your child has other signs of infection (chills, lethargy, irritability, poor eating, vomiting). The younger the child, the more you should be concerned.   With fevers, do not give medicine to lower the fever right away. This could cover up a problem that is developing. Notify your caregiver.   You or your child develops pain that is not helped with medicine.   You or your child develops shortness of breath or is coughing up pus-like or bloody sputum.   You or your child develops any problems that are new and are causing you to worry.   You or  your child develops a persistent, often uncomfortable and painful penile erection. This is called priapism. Always check young boys for this. It is often embarrassing for them and they may not bring it to your attention. This is a medical emergency and needs immediate treatment. If this is not treated it will lead to impotence.   You or your child develops a new onset of abdominal pain, especially on the left side near the  stomach area.   You or your child has any questions or has problems that are not getting better. Return immediately if you feel your child is getting worse, even if your child was seen only a short while ago.  Document Released: 11/09/2004 Document Revised: 01/19/2011 Document Reviewed: 03/31/2009 [facility] Patient Information 2012 [employer].

## 2011-04-18 NOTE — Assessment & Plan Note (Signed)
Pt hgb 7.6 on spot check in clinic.  Concern that may be lower in setting of volume contraction - plan to have CBC drawn at ED

## 2011-04-19 NOTE — ED Provider Notes (Signed)
Medical screening examination/treatment/procedure(s) were conducted as a shared visit with non-physician practitioner(s) and myself.  I personally evaluated the patient during the encounter   Julianne Rice, MD 04/19/11 2040

## 2011-04-28 ENCOUNTER — Ambulatory Visit (INDEPENDENT_AMBULATORY_CARE_PROVIDER_SITE_OTHER): Payer: Medicare Other | Admitting: Family Medicine

## 2011-04-28 VITALS — BP 122/80 | HR 88 | Temp 98.8°F | Ht 64.0 in | Wt 136.9 lb

## 2011-04-28 DIAGNOSIS — D571 Sickle-cell disease without crisis: Secondary | ICD-10-CM

## 2011-04-28 DIAGNOSIS — D5700 Hb-SS disease with crisis, unspecified: Secondary | ICD-10-CM

## 2011-04-28 MED ORDER — OXYCODONE-ACETAMINOPHEN 10-325 MG PO TABS: 1.0000 | ORAL_TABLET | Freq: Three times a day (TID) | ORAL | Status: DC | PRN

## 2011-04-28 MED ORDER — OXYCODONE-ACETAMINOPHEN 10-325 MG PO TABS: 1.0000 | ORAL_TABLET | Freq: Four times a day (QID) | ORAL | Status: DC | PRN

## 2011-04-28 MED ORDER — ENALAPRIL MALEATE 5 MG PO TABS: 5.0000 mg | ORAL_TABLET | Freq: Every day | ORAL | Status: DC

## 2011-04-28 MED ORDER — OXYCODONE HCL 40 MG PO TB12: 40.0000 mg | ORAL_TABLET | Freq: Two times a day (BID) | ORAL | Status: DC

## 2011-04-28 MED ORDER — NORTRIPTYLINE HCL 25 MG PO CAPS: 25.0000 mg | ORAL_CAPSULE | Freq: Every day | ORAL | Status: DC

## 2011-04-28 MED ORDER — THERA VITAL M PO TABS: 1.0000 | ORAL_TABLET | Freq: Every day | ORAL | Status: DC

## 2011-04-28 MED ORDER — HYDROXYUREA 500 MG PO CAPS: 1500.0000 mg | ORAL_CAPSULE | Freq: Every day | ORAL | Status: DC

## 2011-04-28 NOTE — Progress Notes (Signed)
  Subjective:    Patient ID: [name] [name], male    DOB: 1980/11/10, 31 y.o.   MRN: [id number]  HPI Follow-up: sickle cell and narcotic re-fills  Seen recently here and then sent to the ED for fluids and pain medications (received Dilaudid). Felt better and left after treatment in ED.  Reports no pain or difficulty breathing today.  Compliant with medications.   Review of Systems Per HPI    Objective:   Physical Exam Gen: NAD Pulm: CTAB without w/r/r MSK: non-tender throughout, including legs and back     Assessment & Plan:

## 2011-04-28 NOTE — Assessment & Plan Note (Addendum)
Stable.  Continue current medications. Will fill narcotics for 3 months.  Start MV today. Continue enalapril, hydroxyurea.

## 2011-04-28 NOTE — Patient Instructions (Signed)
Follow-up in 3 months for medication refills or sooner if needed.  Start taking the multivitamin.  I changed the dose of your enalapril so you only have to take 1 tablet a day.

## 2011-05-01 ENCOUNTER — Other Ambulatory Visit: Payer: Self-pay | Admitting: Family Medicine

## 2011-05-01 MED ORDER — OXYCODONE-ACETAMINOPHEN 10-325 MG PO TABS: 1.0000 | ORAL_TABLET | Freq: Three times a day (TID) | ORAL | Status: AC | PRN

## 2011-05-30 IMAGING — CR DG CHEST 2V
1 series · 1 of 1 positions shown · non-contrast
Comparison: Chest x-ray 10/07/2009.

CLINICAL DATA: Sickle cell crisis.  Chest pain.

CHEST - 2 VIEW

[view not recorded]
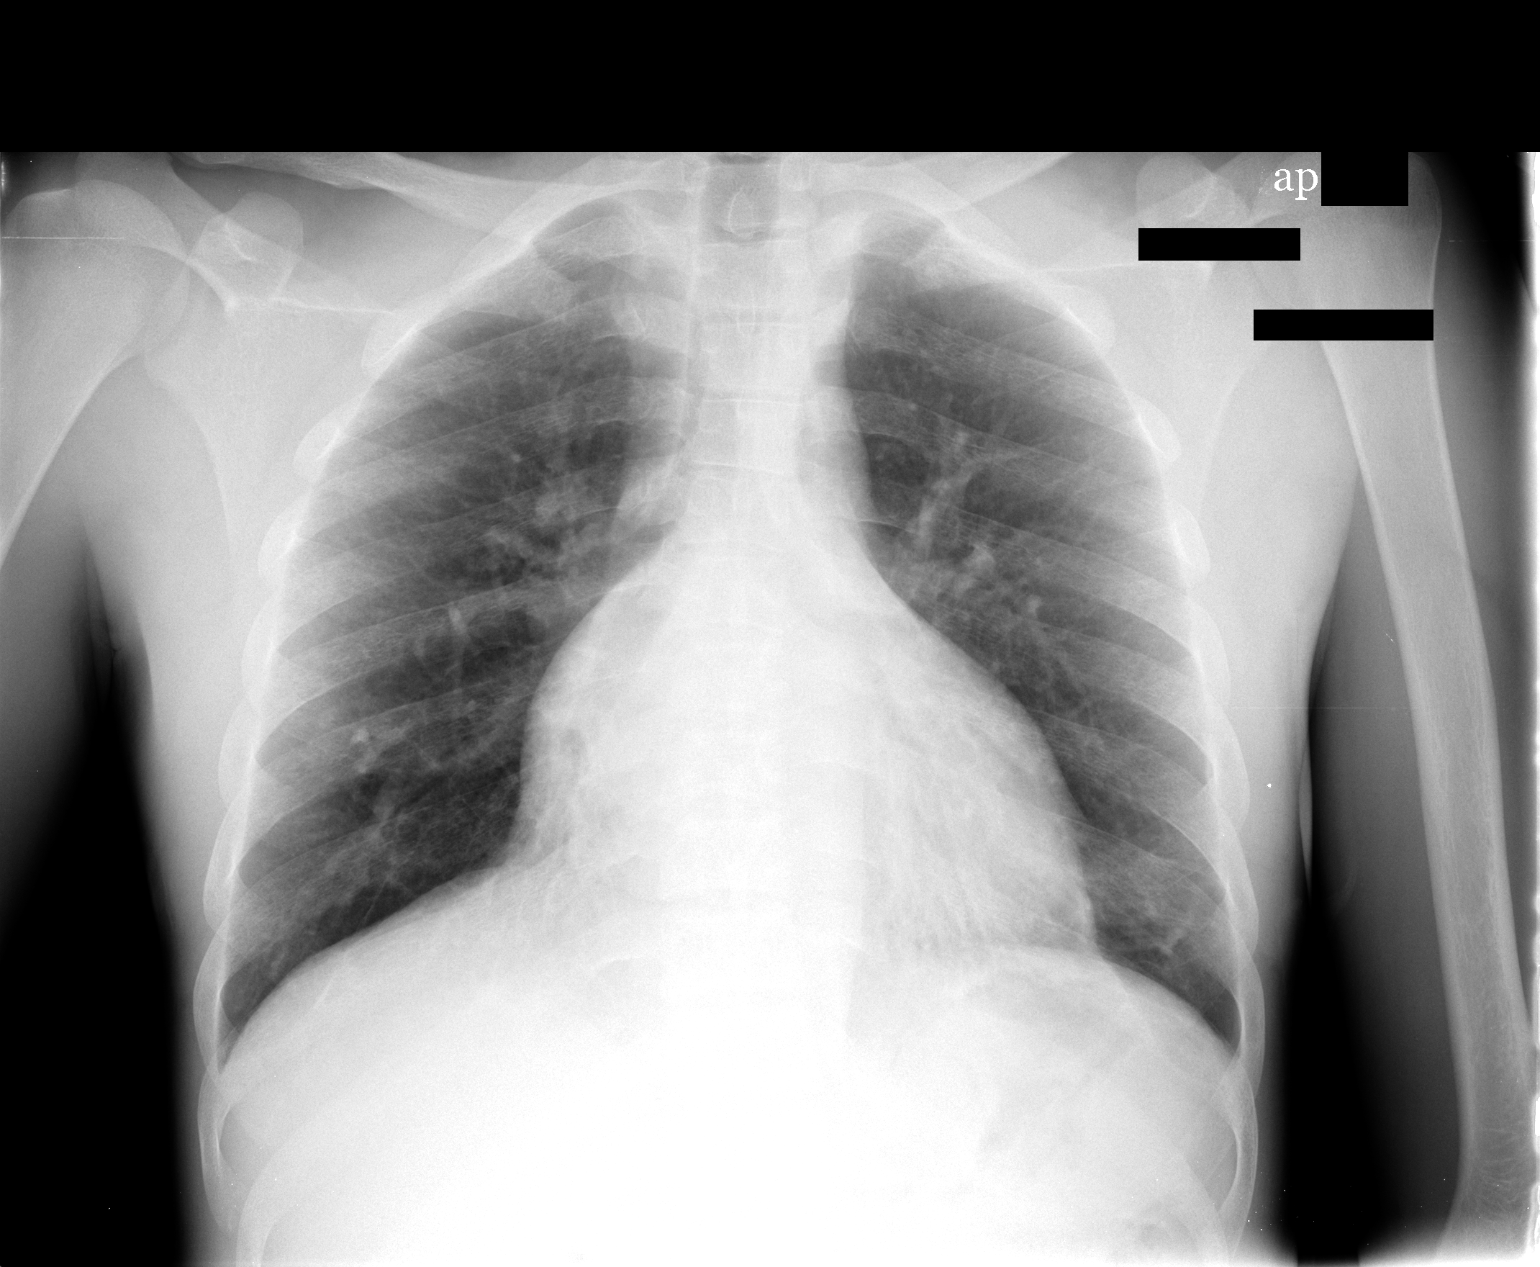

[1 of 1 positions shown; findings below may reference images not displayed]

FINDINGS: The heart is enlarged but stable.  Mild central vascular
congestion but no pulmonary edema.  Mild chronic  basilar
interstitial scarring type changes.  No infiltrates or effusions.
The bony thorax is intact.
IMPRESSION: Mild stable cardiac enlargement.
No acute pulmonary findings.

## 2011-06-01 IMAGING — CR DG CHEST 2V
2 series · 2 of 2 positions shown · non-contrast
Comparison: 12/23/2009

CLINICAL DATA: Pain.  Sickle cell crisis.

CHEST - 2 VIEW

[w chest pa]
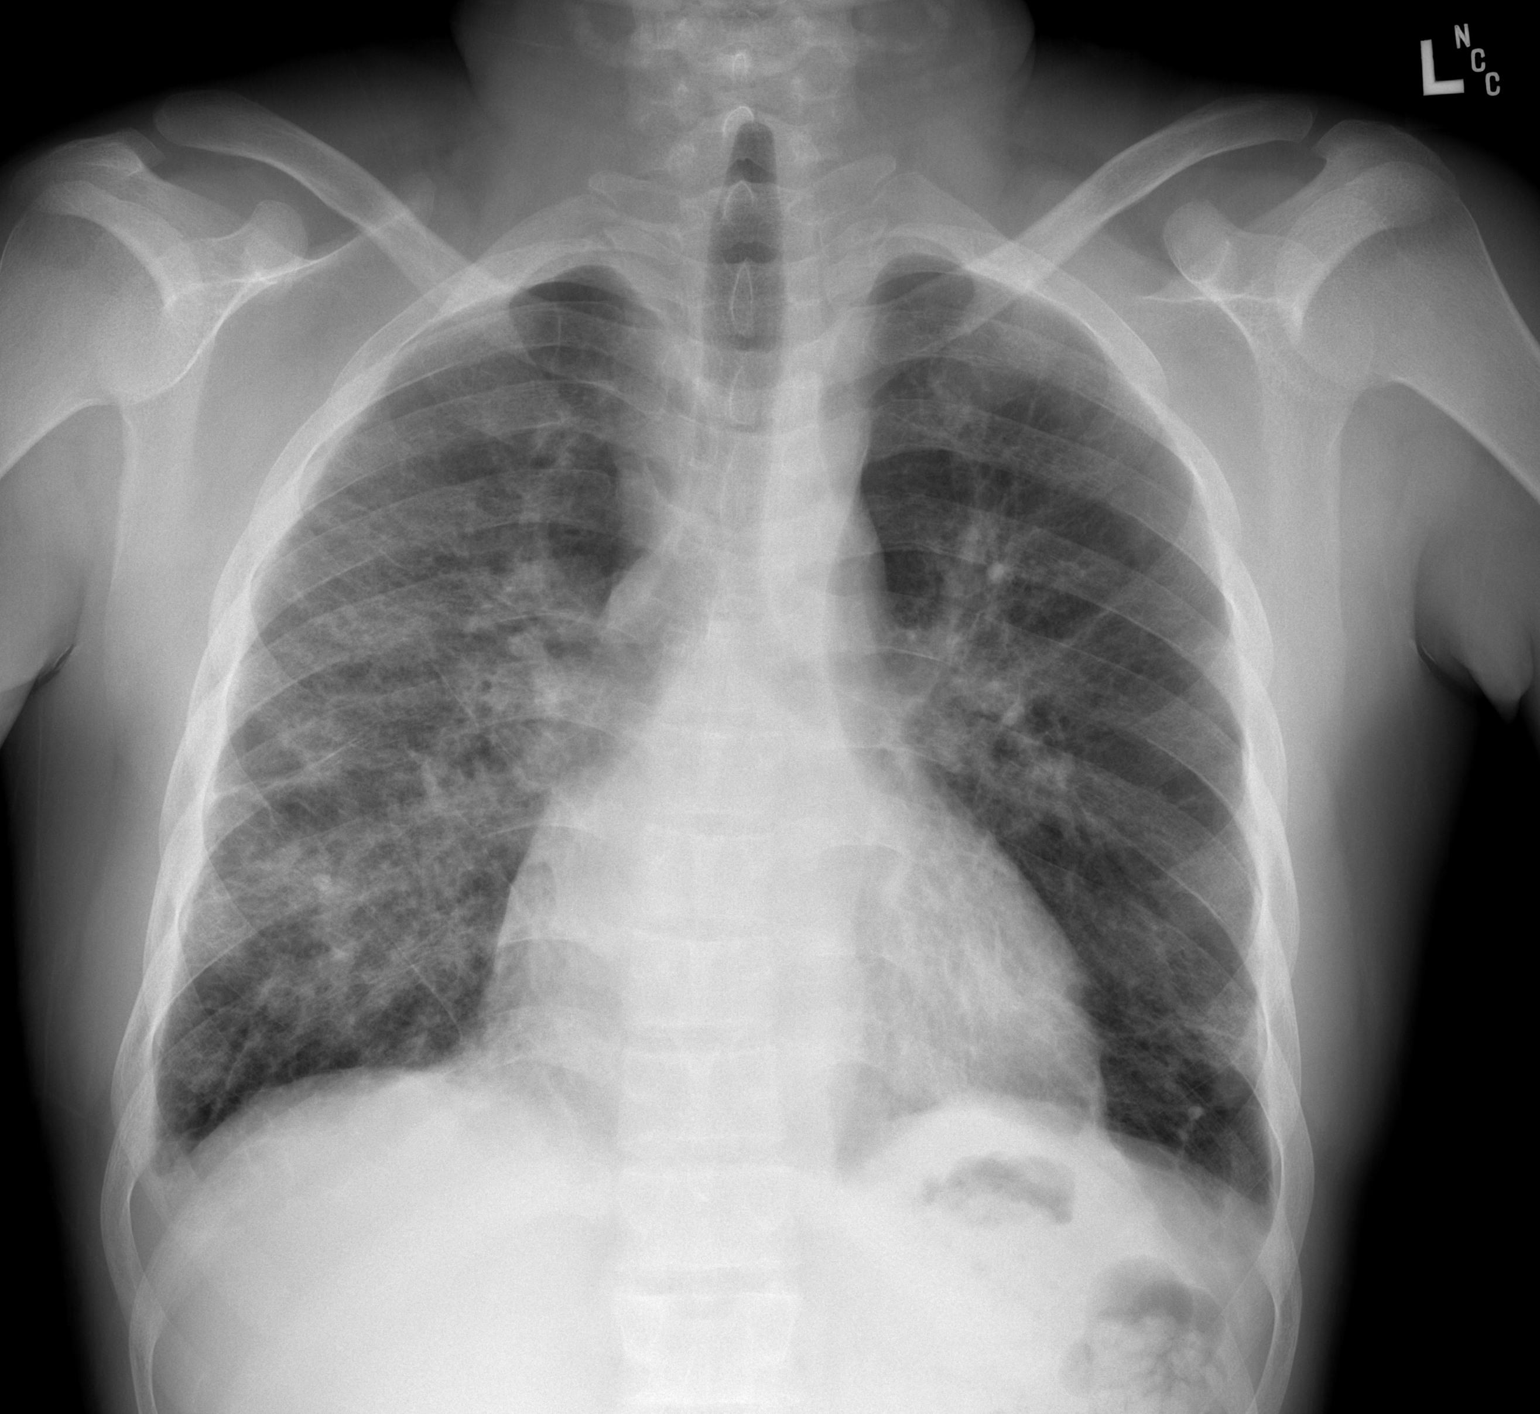

[w chest lat]
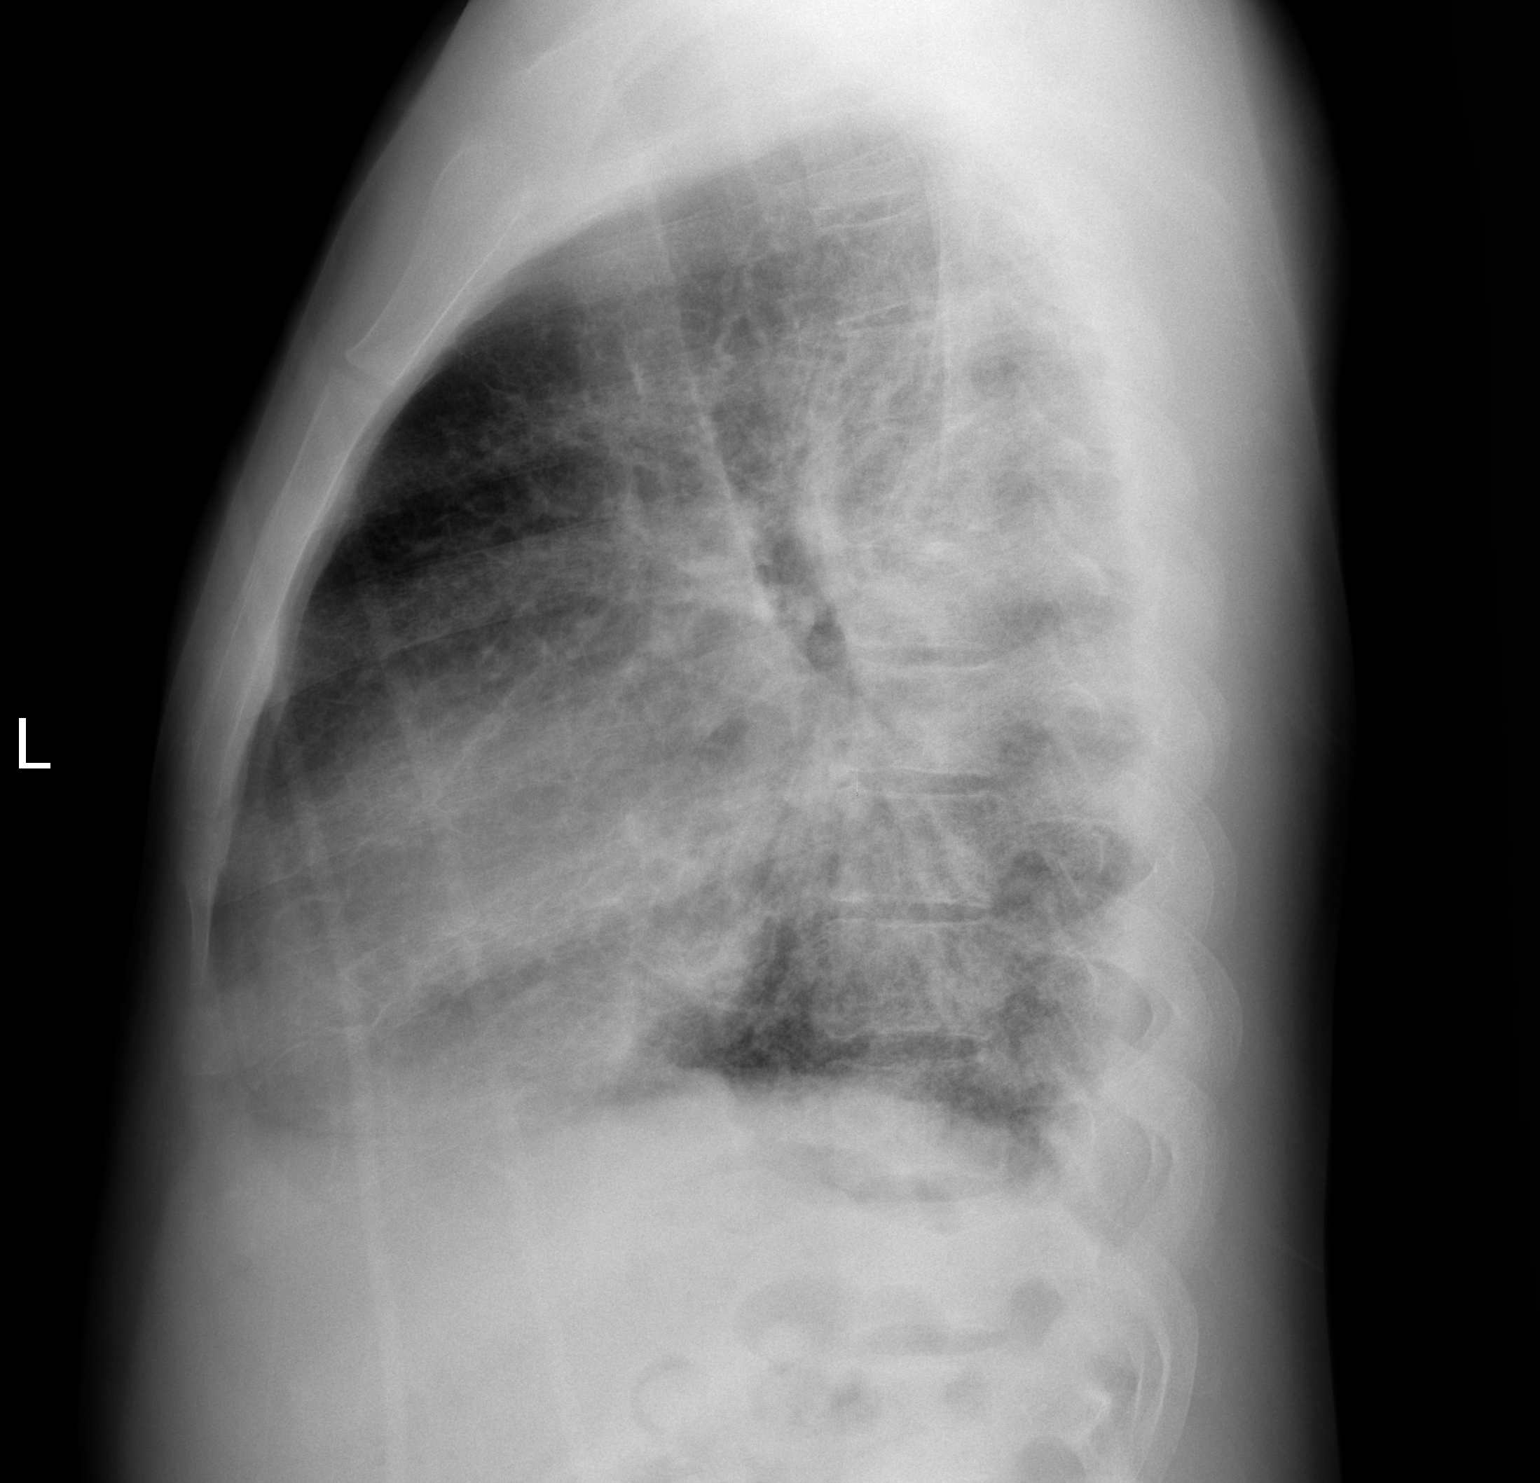

[2 of 2 positions shown; findings below may reference images not displayed]

FINDINGS: The patient now has bilateral interstitial pulmonary
edema with slight pulmonary vascular prominence.  There is mild
cardiomegaly.
IMPRESSION: New bilateral interstitial pulmonary edema.

## 2011-06-02 IMAGING — CR DG ABDOMEN 1V
1 series · 1 of 1 positions shown · non-contrast
Comparison: None.

CLINICAL DATA: Generalized abdominal pain, history of sickle cell
disease

ABDOMEN - 1 VIEW

[t abdomen supine *]
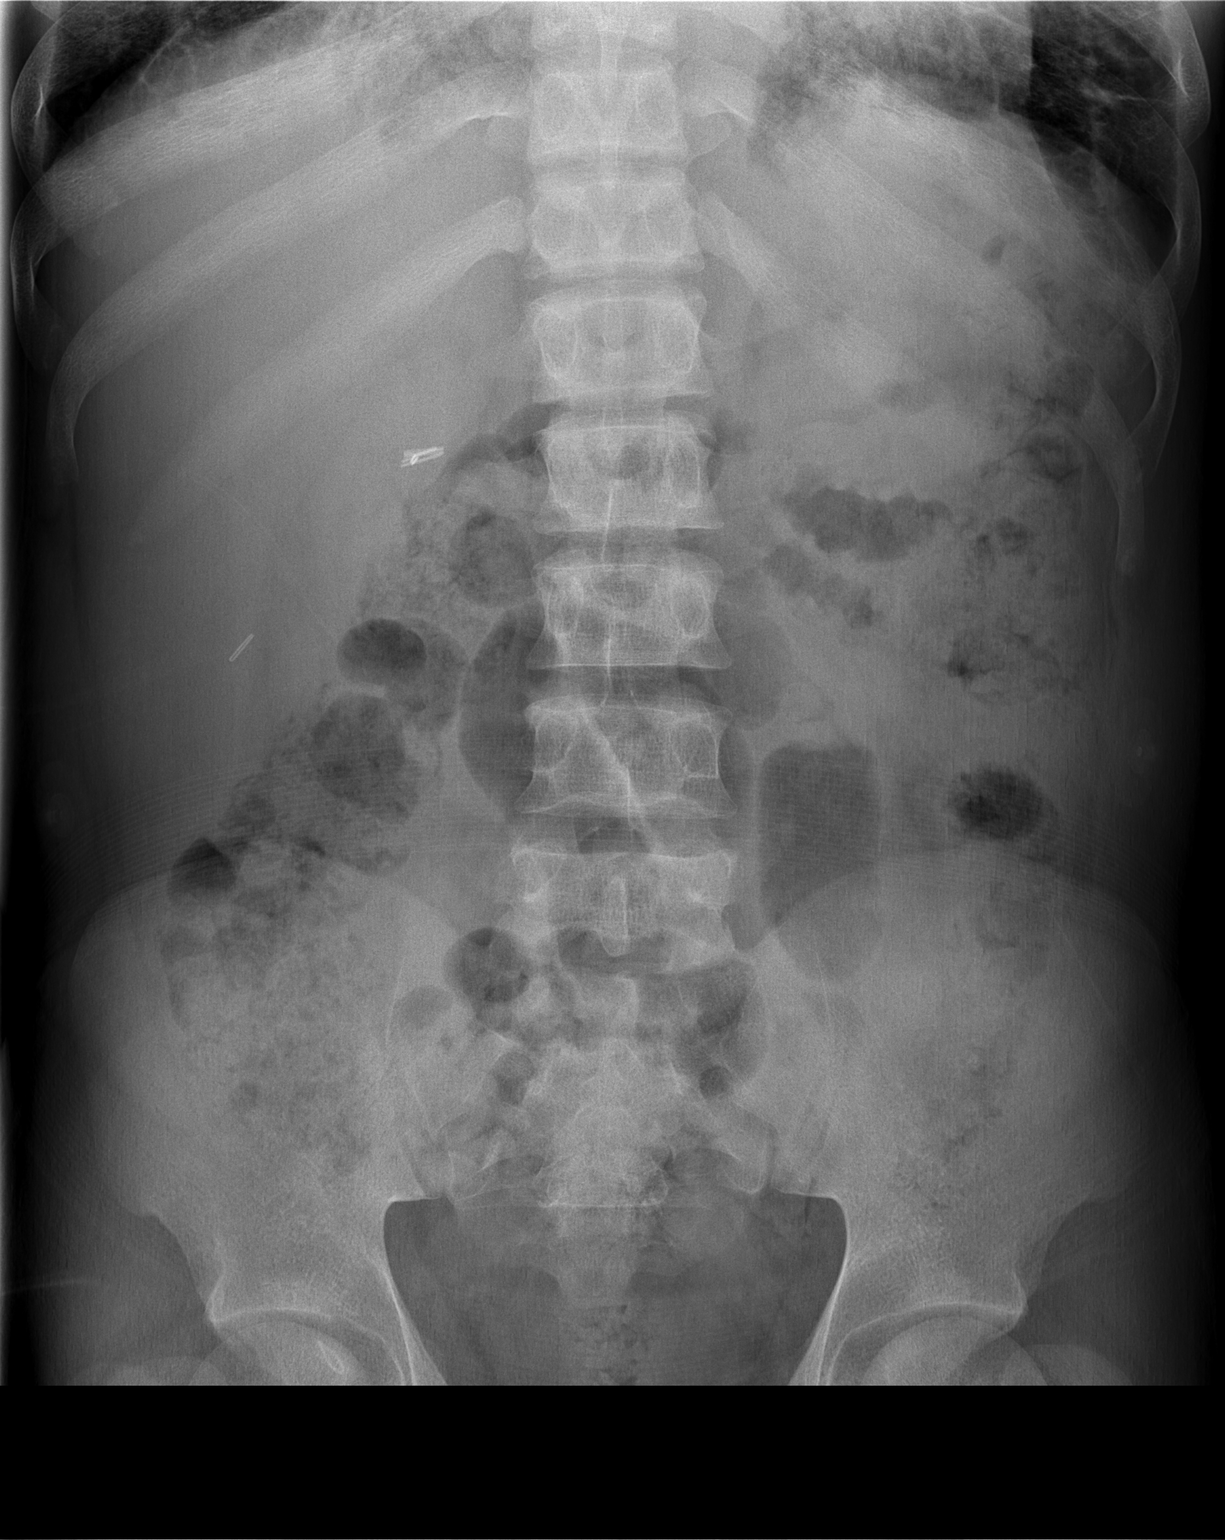

[1 of 1 positions shown; findings below may reference images not displayed]

FINDINGS: A supine film the abdomen shows a moderate to large
amount of feces throughout the entire colon.  No bowel obstruction
is seen.  Surgical clips are present in the right upper quadrant
from prior cholecystectomy.  Probable bibasilar atelectasis is
noted.
IMPRESSION: Moderate to large amount of feces throughout the colon.  No bowel
obstruction.

## 2011-06-02 IMAGING — CR DG CHEST 2V
2 series · 2 of 2 positions shown · non-contrast
Comparison: Chest x-ray of 12/25/2009

CLINICAL DATA: Cough, congestion, generalized chest pain, sickle
cell disease

CHEST - 2 VIEW

[w chest pa]
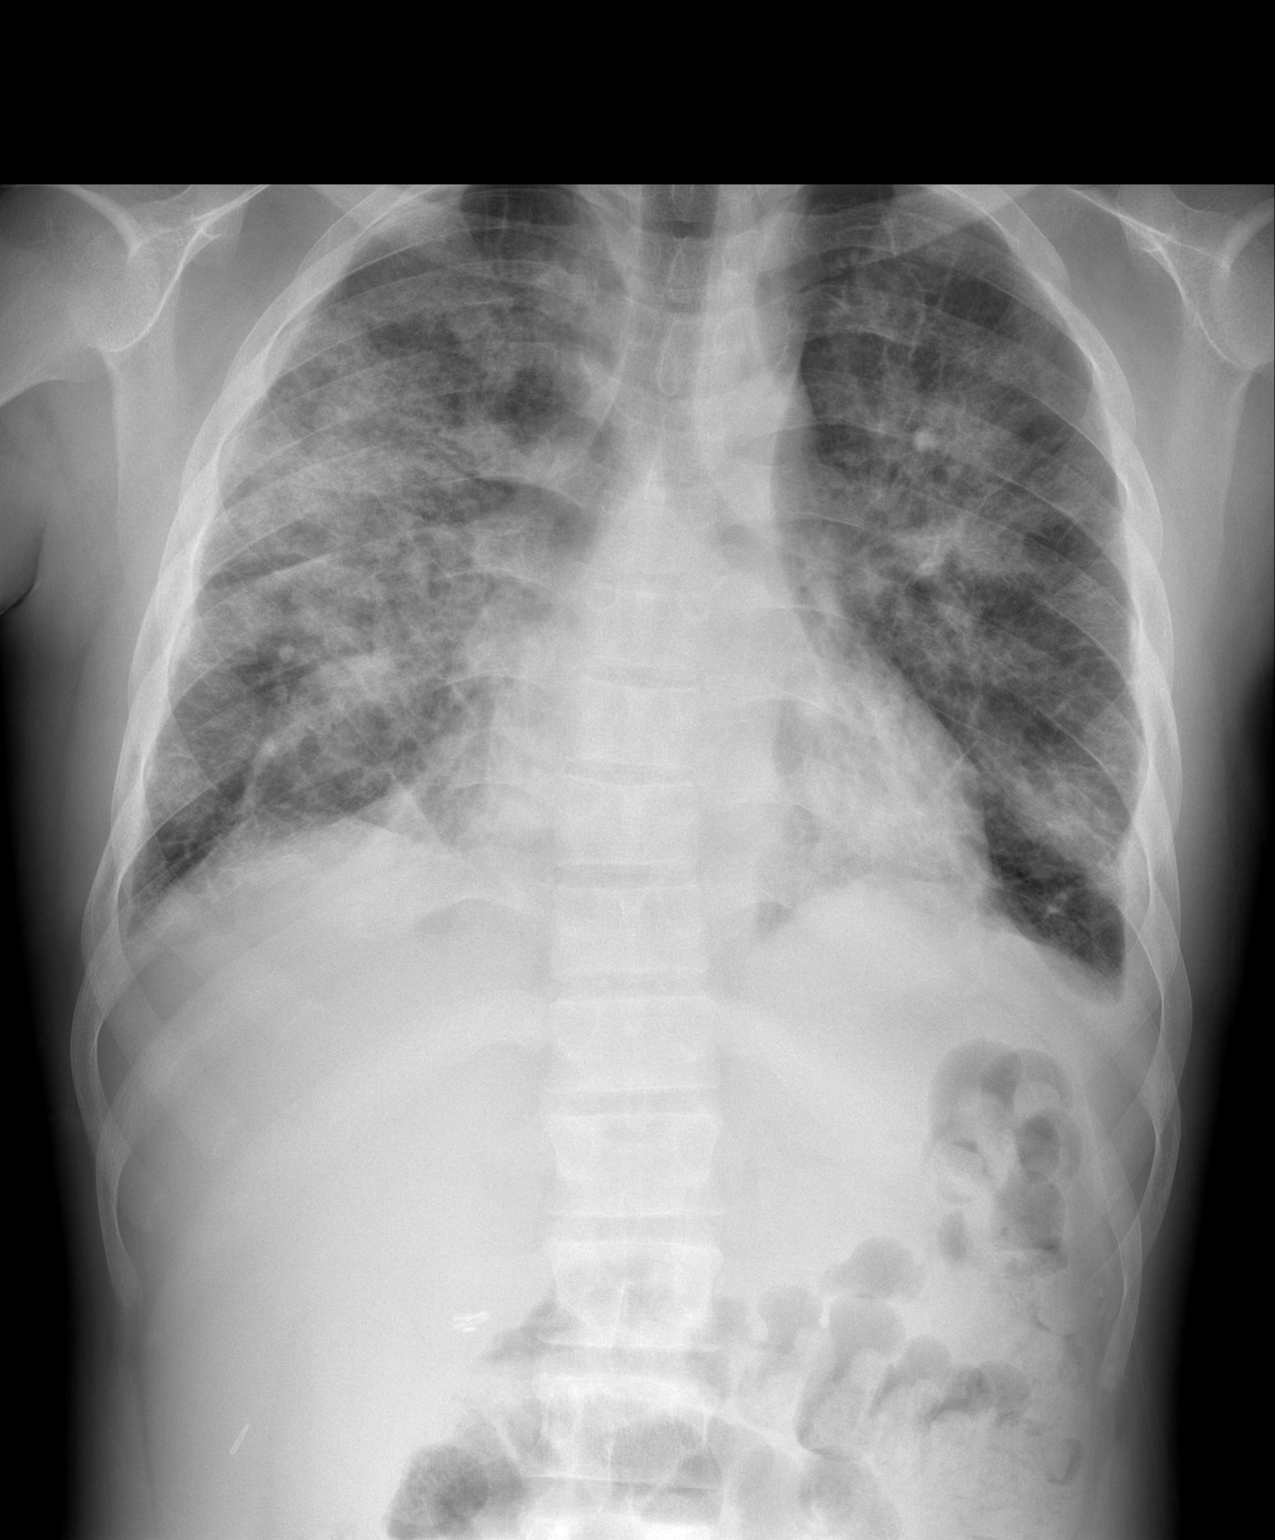

[w chest lat *]
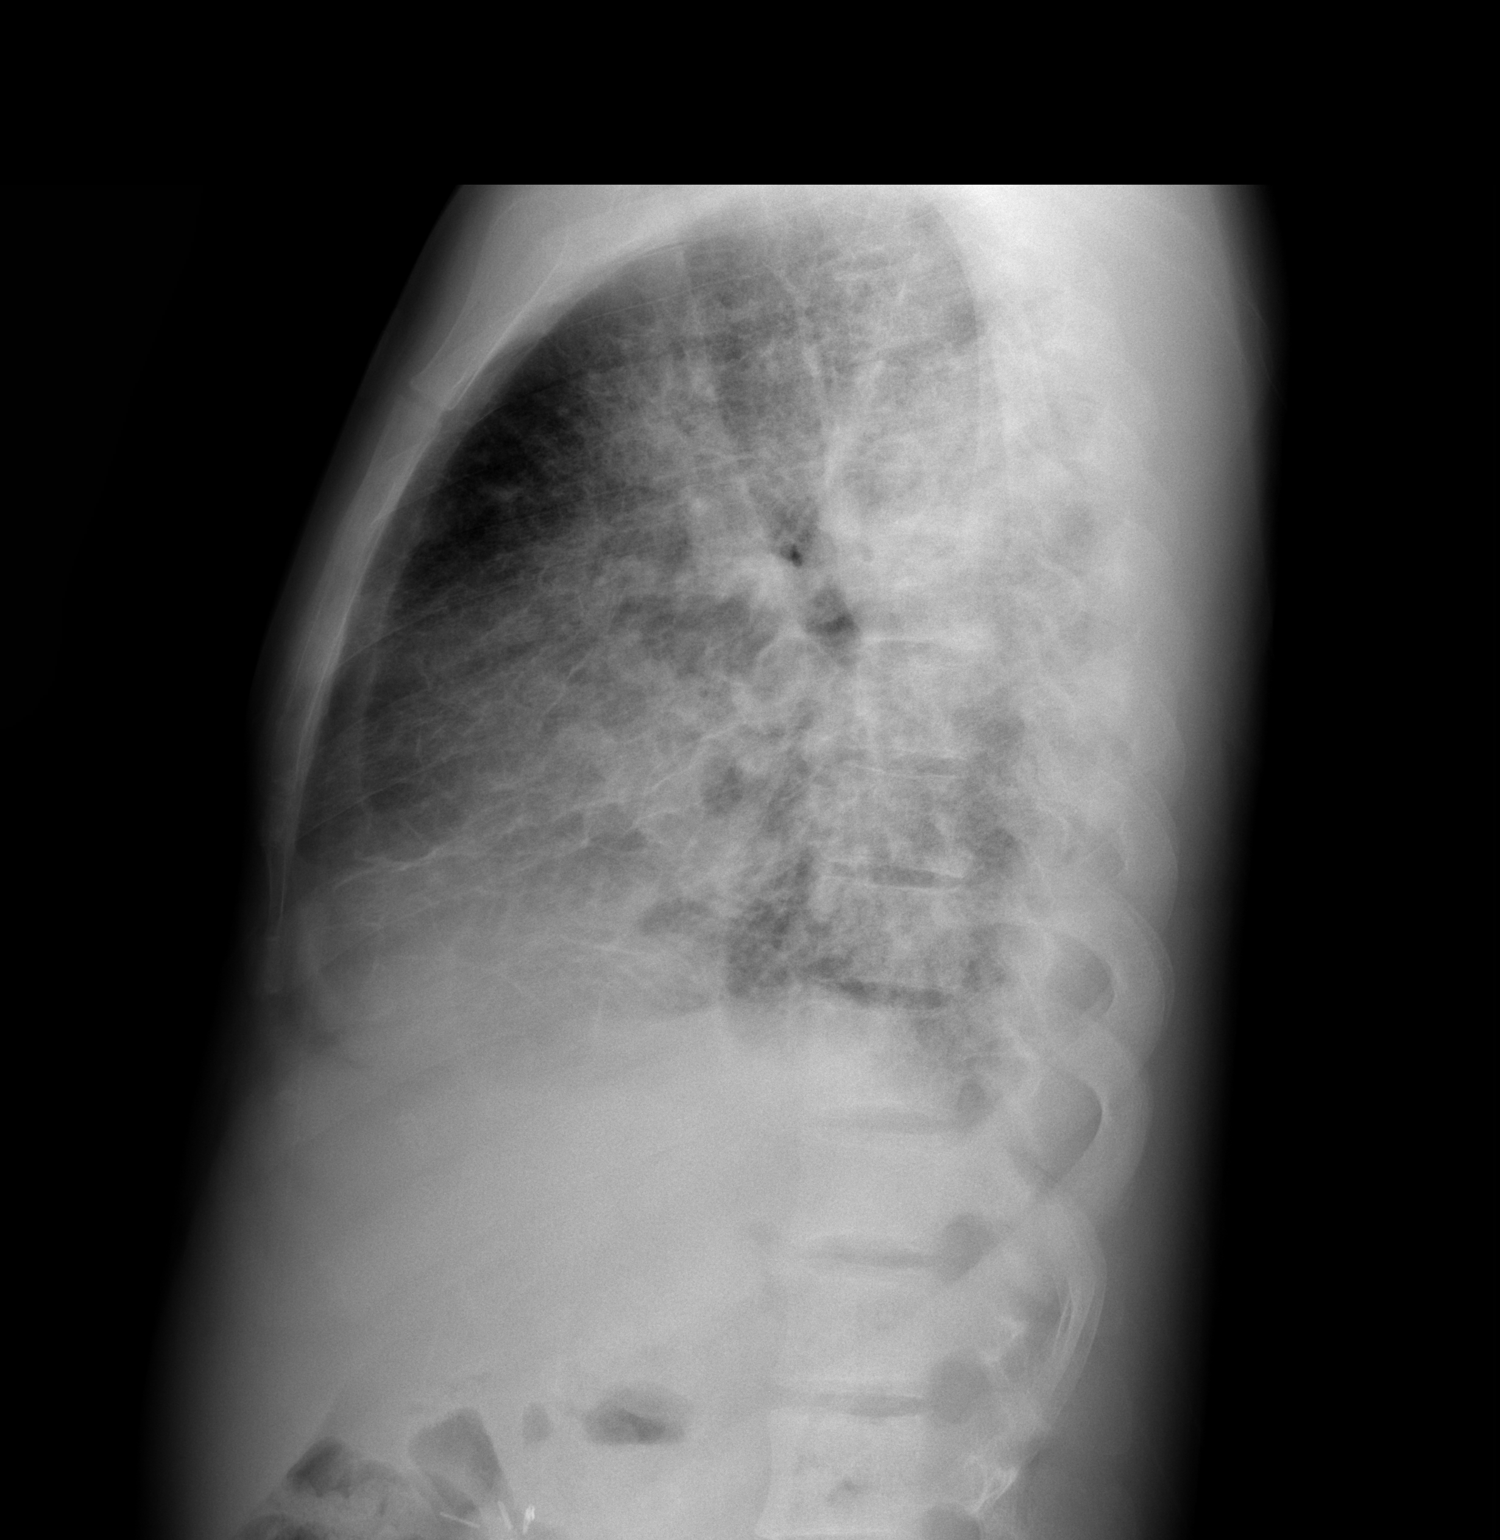

[2 of 2 positions shown; findings below may reference images not displayed]

FINDINGS: There has been worsening of bilateral parenchymal
opacities most consistent with pneumonia.  Somewhat prominent
interstitial markings may also reflect mild interstitial edema.
There does appear to be a small left pleural effusion present.  The
heart is mildly enlarged.  No bony abnormality is seen.
IMPRESSION: Worsening of airspace disease may indicate pneumonia, although
edema would be a consideration in view of the prominent
interstitial markings as well.

## 2011-06-04 IMAGING — CR DG CHEST 2V
2 series · 2 of 2 positions shown · non-contrast
Comparison: 12/26/2009

CLINICAL DATA: sickle cell crisis.  Chest pain and shortness of
breath

CHEST - 2 VIEW

[w chest pa]
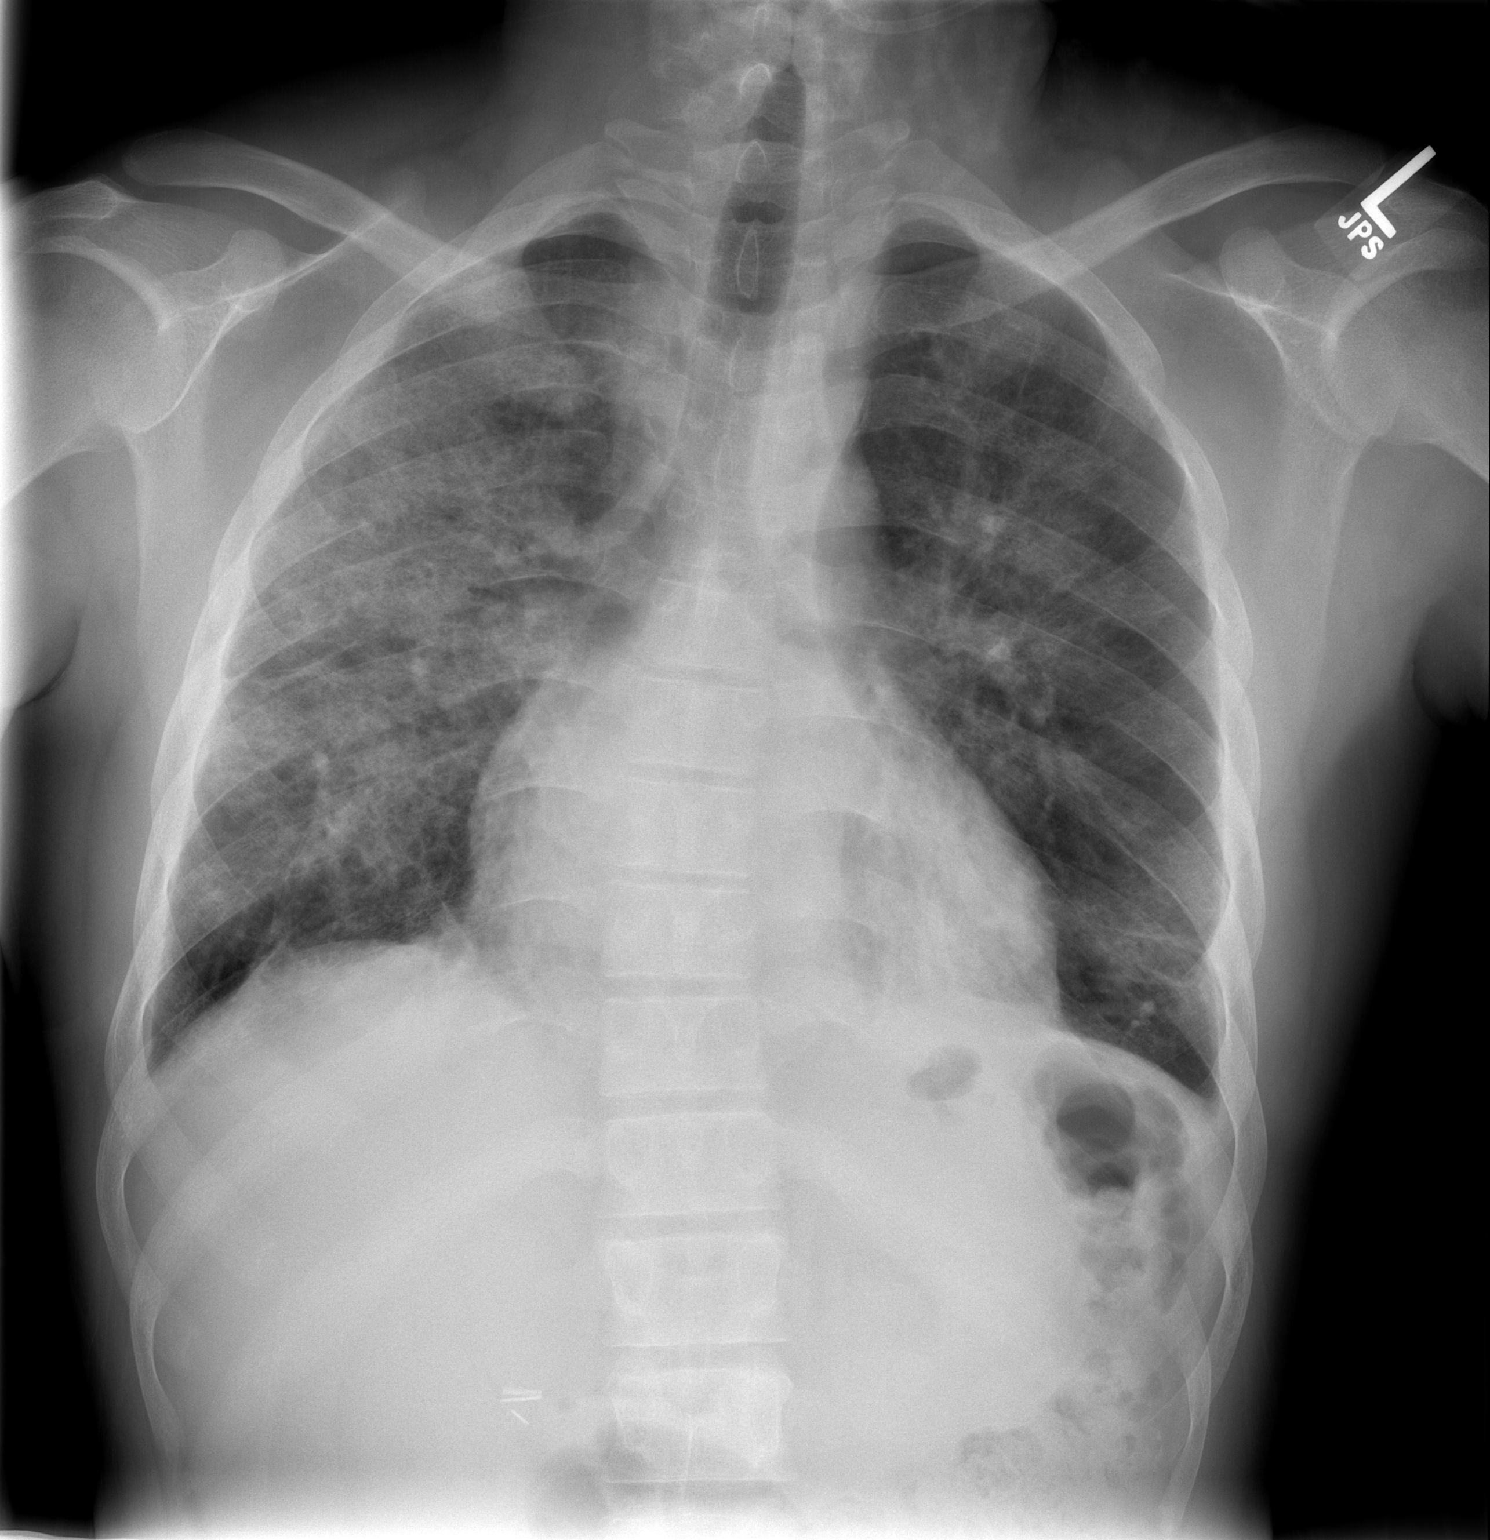

[w chest lat]
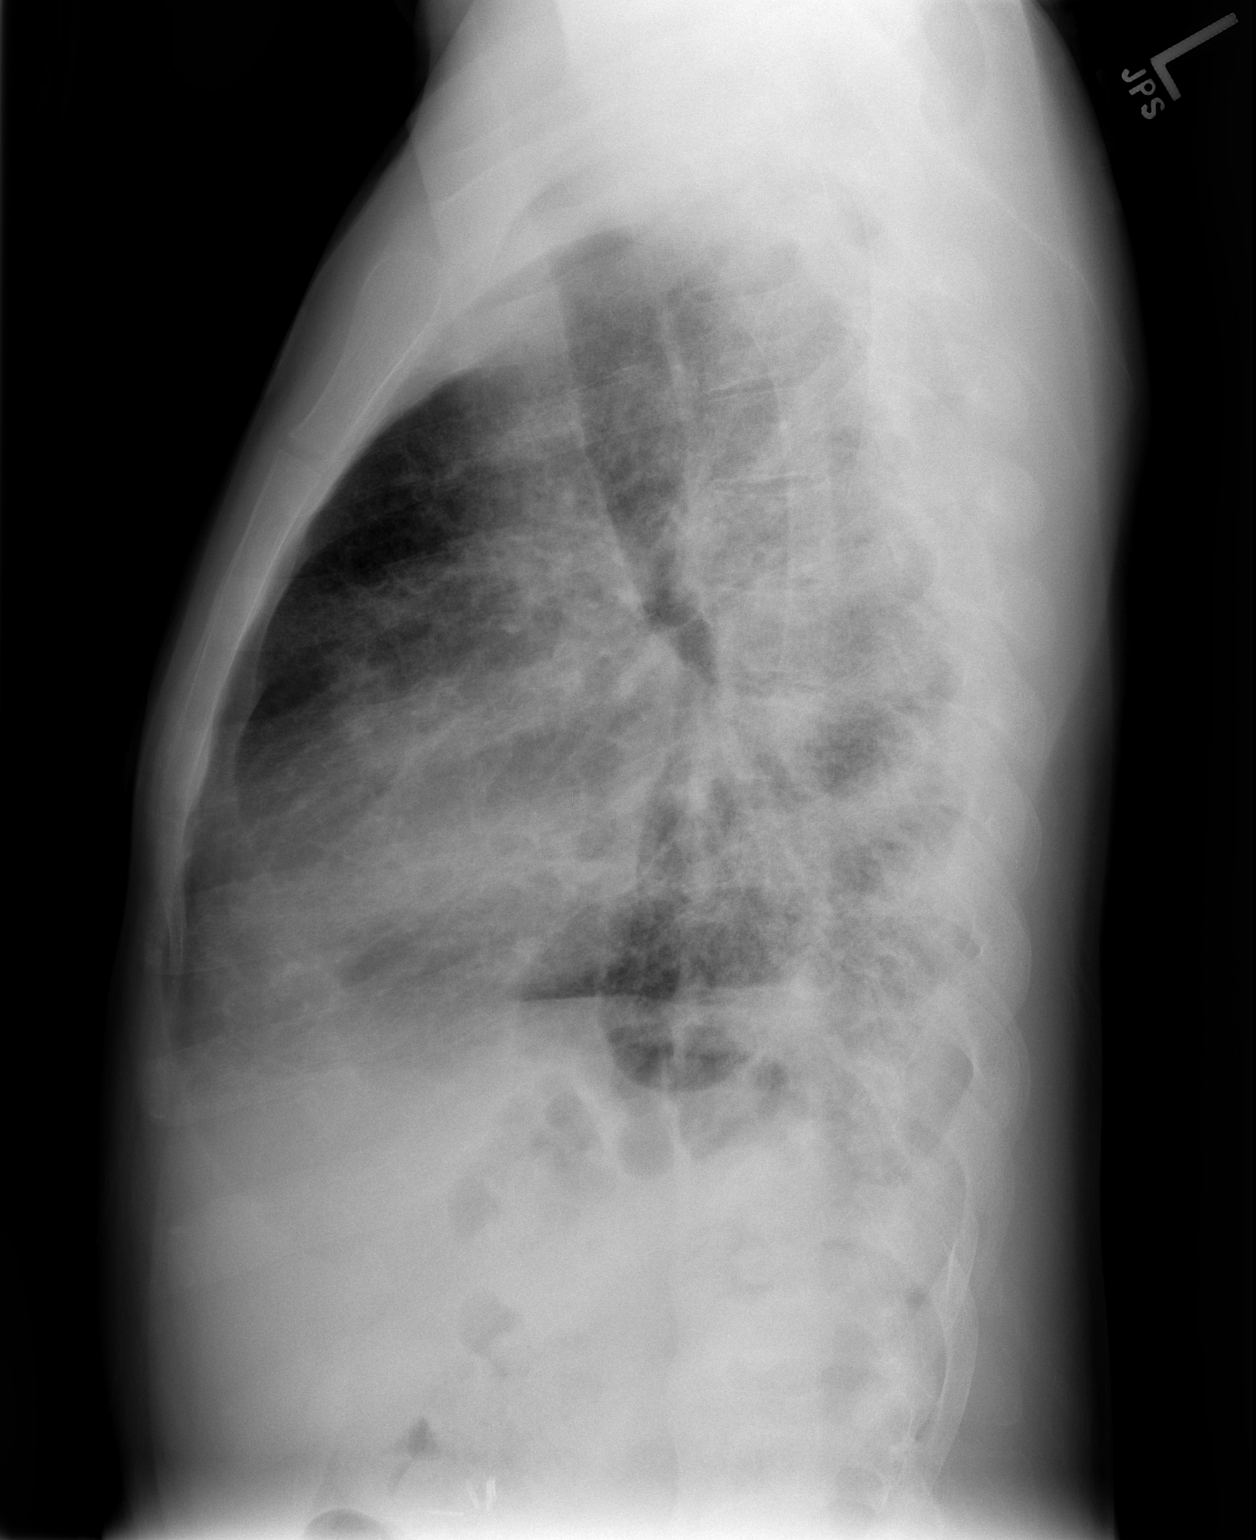

[2 of 2 positions shown; findings below may reference images not displayed]

FINDINGS: Heart size is mildly enlarged.  There has been interval
improvement in small bilateral pleural effusions.  Persistent
bilateral multifocal airspace disease is noted.  When compared with
the prior exam there has been interval improvement in interstitial
edema pattern.  There are skeletal changes of sickle cell disease.
IMPRESSION: 1.  Improvement in pleural effusions and interstitial edema.

## 2011-06-19 ENCOUNTER — Ambulatory Visit (INDEPENDENT_AMBULATORY_CARE_PROVIDER_SITE_OTHER): Payer: Medicare Other | Admitting: Family Medicine

## 2011-06-19 VITALS — BP 118/79 | HR 111 | Temp 98.6°F | Wt 138.0 lb

## 2011-06-19 DIAGNOSIS — D5700 Hb-SS disease with crisis, unspecified: Secondary | ICD-10-CM

## 2011-06-19 DIAGNOSIS — D571 Sickle-cell disease without crisis: Secondary | ICD-10-CM

## 2011-06-19 MED ORDER — OXYCODONE-ACETAMINOPHEN 10-325 MG PO TABS: 1.0000 | ORAL_TABLET | Freq: Four times a day (QID) | ORAL | Status: DC | PRN

## 2011-06-19 MED ORDER — KETOROLAC TROMETHAMINE 30 MG/ML IJ SOLN
30.0000 mg | Freq: Once | INTRAMUSCULAR | Status: AC
  Administered 2011-06-19: 30 mg via INTRAMUSCULAR

## 2011-06-19 MED ORDER — OXYCODONE HCL 40 MG PO TB12: 40.0000 mg | ORAL_TABLET | Freq: Two times a day (BID) | ORAL | Status: DC

## 2011-06-19 MED ORDER — OXYCODONE-ACETAMINOPHEN 10-325 MG PO TABS: 1.0000 | ORAL_TABLET | Freq: Three times a day (TID) | ORAL | Status: AC | PRN

## 2011-06-19 MED ORDER — ENALAPRIL MALEATE 5 MG PO TABS: 5.0000 mg | ORAL_TABLET | Freq: Every day | ORAL | Status: DC

## 2011-06-19 MED ORDER — HYDROXYUREA 500 MG PO CAPS: 1500.0000 mg | ORAL_CAPSULE | Freq: Every day | ORAL | Status: DC

## 2011-06-19 MED ORDER — NORTRIPTYLINE HCL 25 MG PO CAPS: 25.0000 mg | ORAL_CAPSULE | Freq: Every day | ORAL | Status: DC

## 2011-06-19 NOTE — Progress Notes (Signed)
  Subjective:    Patient ID: [name] [name]., male    DOB: 10-26-1980, 31 y.o.   MRN: [id number]  HPI Follow-up: narcotic refill for chronic sickle cell pain  Mild back pain today due to weather. Requesting Toradol shot.   Denies difficulty breathing or other significant pain.   Discussed need for large quantity of narcotics. Patient reports need for them. They help him function.  Besides Oxycontin bid, he takes about 2-3 tablets of percocet daily.   He has not signed a pain contract.   Review of Systems Per HPI    Objective:   Physical Exam Gen: NAD Pulm: NI WOB, CTAB without w/r/r Chest: no TTP Back: no significant TTP throughout    Assessment & Plan:

## 2011-06-19 NOTE — Patient Instructions (Signed)
Follow up in 2 months for medications

## 2011-06-19 NOTE — Assessment & Plan Note (Addendum)
Refilling narcotics today. He has been getting 80 tablets of percocet a month. He insists he needs these medications. However, dependence is obvious concern.  Will check UDS today. Pain contract today. Patient was initially resistant to this but agreed to conditions (periodic UDS, narcotics only from here) at the end.  At the next visit, I would like to get him on high dose of Oxycontin (currently 40 bid) and lower dose of percocet.  Requesting Toradol shot today for lower back pain; mild pain crisis due to rainy weather today.  Continue enalapril, hydroxyurea.  Follow-up in 2 months.

## 2011-06-19 NOTE — Progress Notes (Signed)
Addended by: Christen Bame D on: 06/19/2011 03:11 PM   Modules accepted: Orders

## 2011-08-08 ENCOUNTER — Ambulatory Visit (INDEPENDENT_AMBULATORY_CARE_PROVIDER_SITE_OTHER): Payer: Medicare Other | Admitting: Family Medicine

## 2011-08-08 ENCOUNTER — Encounter: Payer: Self-pay | Admitting: Family Medicine

## 2011-08-08 VITALS — BP 115/76 | HR 98 | Ht 64.0 in | Wt 132.0 lb

## 2011-08-08 DIAGNOSIS — D571 Sickle-cell disease without crisis: Secondary | ICD-10-CM

## 2011-08-08 LAB — POCT HEMOGLOBIN: Hemoglobin: 8.9 g/dL — AB (ref 14.1–18.1)

## 2011-08-08 MED ORDER — IBUPROFEN 800 MG PO TABS: 800.0000 mg | ORAL_TABLET | Freq: Three times a day (TID) | ORAL | Status: AC | PRN

## 2011-08-08 MED ORDER — KETOROLAC TROMETHAMINE 30 MG/ML IJ SOLN
30.0000 mg | Freq: Once | INTRAMUSCULAR | Status: AC
  Administered 2011-08-08: 30 mg via INTRAMUSCULAR

## 2011-08-08 MED ORDER — KETOROLAC TROMETHAMINE 30 MG/ML IM SOLN: 30.0000 mg | Freq: Once | INTRAMUSCULAR | Status: DC

## 2011-08-08 NOTE — Progress Notes (Signed)
  Subjective:   Patient ID: [name] [name]., male DOB: 08-30-1980 31 y.o. MRN: [id number] HPI:  1. Sickle Cell SS disease Synopsis: patient has a flare currently. His pain is in his arms and legs. He has no chest pain, fever, or shortness of breath. He is taking oxycodone at home as prescribed by Dr. Verdie Drown. He has one more Rx to refill on Monday. He denies flu like symptoms or recent infection. He comes to clinic for toradol and rx for ibuprofen to avoid admission to hospital.  Location: legs and arms Onset: has been acute  Time period of: 4 day(s).  Severity is described as moderate to severe.  Aggravating: weather changes Alleviating: oxycodone, ibuporfen Associated sx/sn: no fever, no chills, no rash.   History  Substance Use Topics  . Smoking status: Former Smoker    Quit date: 03/04/2010  . Smokeless tobacco: Never Used  . Alcohol Use: No    Review of Systems: Pertinent items are noted in HPI.  Labs Reviewed: yes Reviewed Chart Review for last notes.     Objective:   Filed Vitals:   08/08/11 1105  BP: 115/76  Pulse: 98  Height: 5\' 4"  (1.626 m)  Weight: 132 lb (59.875 kg)   Physical Exam: General: aam, nad, pleasant Lungs:  Normal respiratory effort, chest expands symmetrically. Lungs are clear to auscultation, no crackles or wheezes. Heart - Regular rate and rhythm.  No murmurs, gallops or rubs.    Extremities:   Tender arms and legs, No cyanosis, edema, or deformity noted. Abdomen: soft and non-tender without masses, organomegaly or hernias noted.  No guarding or rebound Assessment & Plan:

## 2011-08-08 NOTE — Patient Instructions (Signed)
Thank you for coming in to see [name] [name].  I hope the Toradol 30 mg injection and the ibuprofen will hold you over during this attack.  Please schedule an appointment to see Dr. Verdie Drown for refills on your long term pain medication.

## 2011-08-08 NOTE — Assessment & Plan Note (Signed)
toradol 30 mg once IM Hgb finger check Advised to relax at home and dont exercise too much. Ibuprofen 800 mg q 8 hours with food. Advised about risk to stomach and kidneys. Continue with RX for oxycodone.

## 2011-08-14 ENCOUNTER — Other Ambulatory Visit: Payer: Self-pay | Admitting: Family Medicine

## 2011-08-14 DIAGNOSIS — D5700 Hb-SS disease with crisis, unspecified: Secondary | ICD-10-CM

## 2011-08-14 MED ORDER — OXYCODONE-ACETAMINOPHEN 10-325 MG PO TABS: 1.0000 | ORAL_TABLET | Freq: Four times a day (QID) | ORAL | Status: DC | PRN

## 2011-08-18 ENCOUNTER — Ambulatory Visit: Payer: Medicare Other | Admitting: Family Medicine

## 2011-08-20 IMAGING — CR DG CHEST 2V
2 series · 2 of 2 positions shown · non-contrast
Comparison: 12/28/2009

CLINICAL DATA: Chest pain.  Shortness of breath.  Sickle cell
disease.

CHEST - 2 VIEW

[w chest pa]
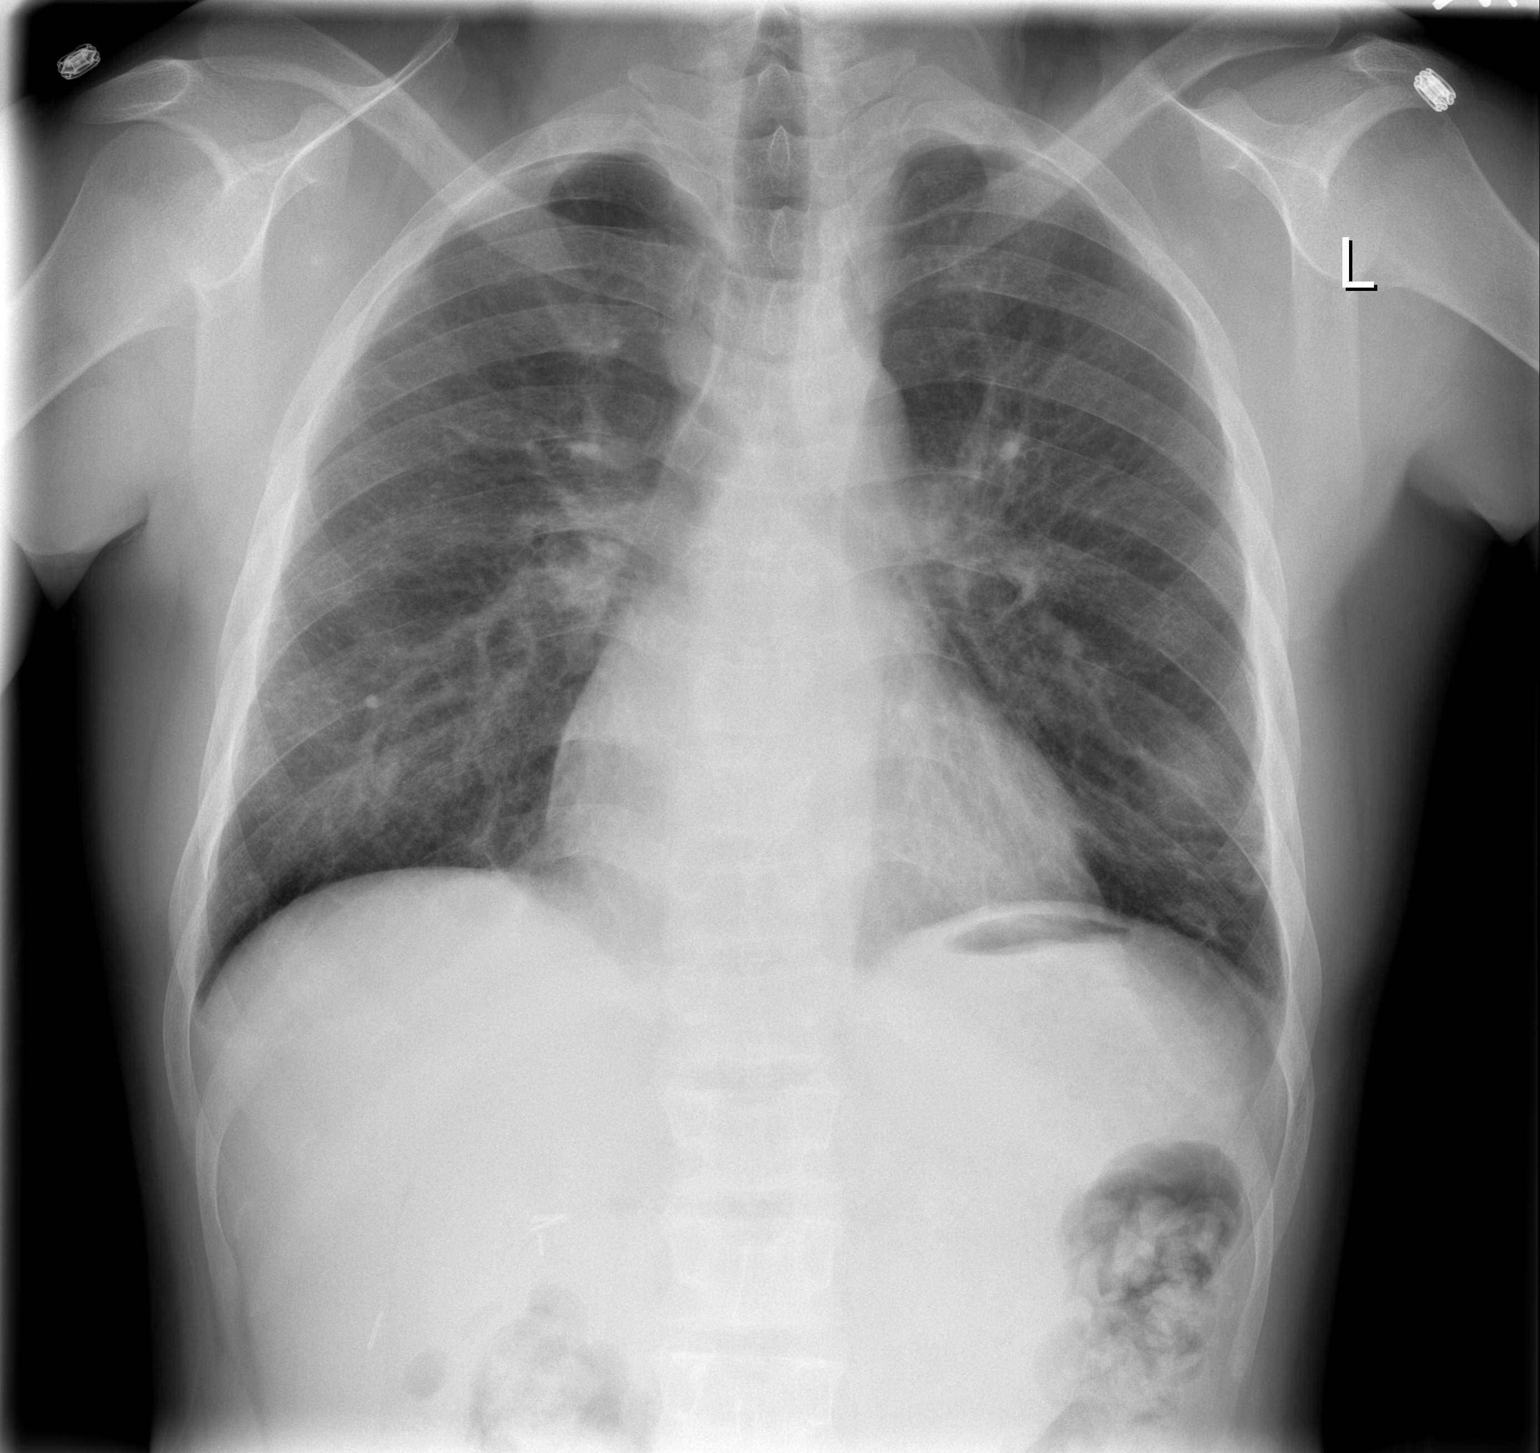

[w chest lat]
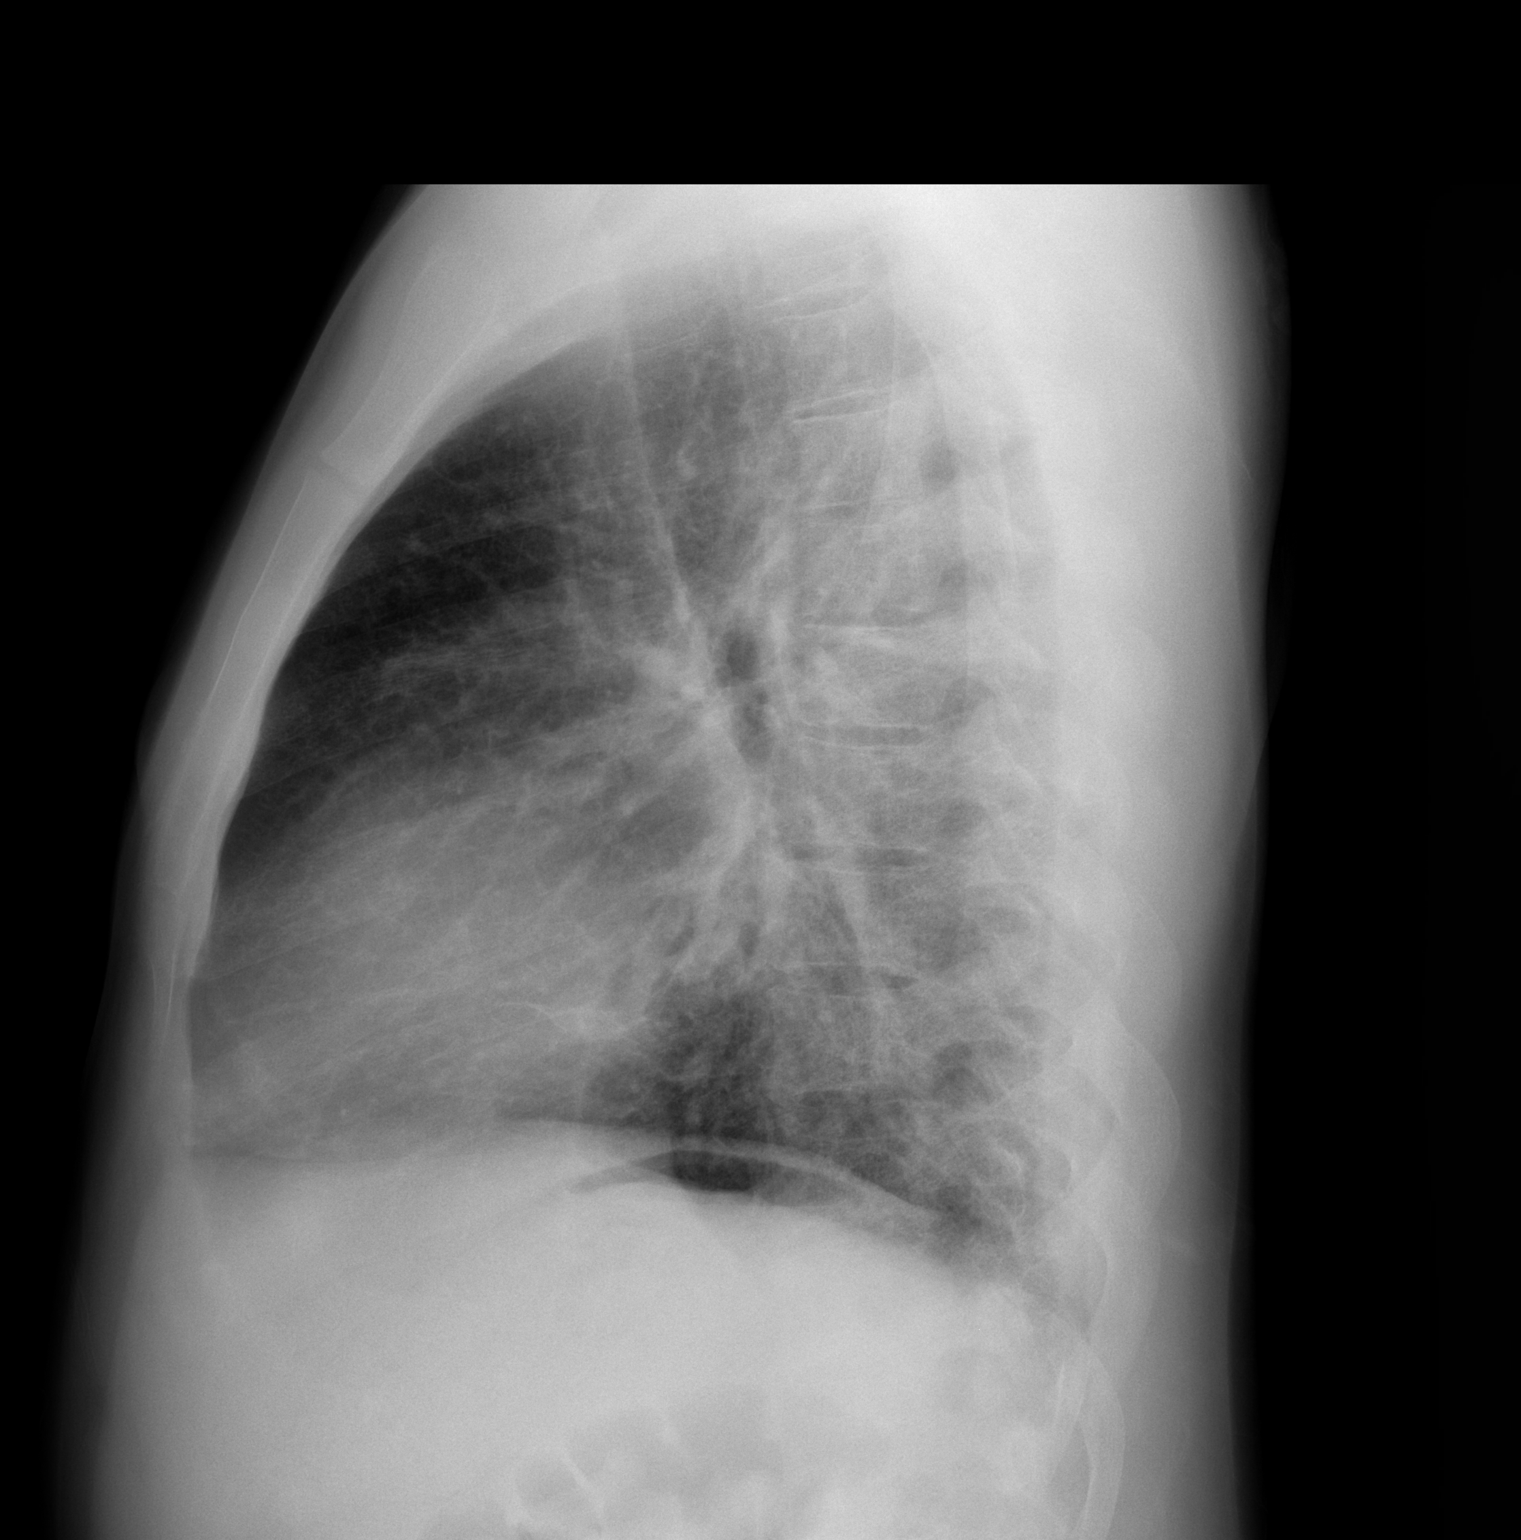

[2 of 2 positions shown; findings below may reference images not displayed]

FINDINGS: Asymmetric pulmonary air space disease has resolved since
previous study.  Mild scarring again noted in the left lung base.
No evidence of pleural effusion.  Heart size and mediastinal
contours are within normal limits.
IMPRESSION: Left basilar scarring.  No active disease.

## 2011-08-29 ENCOUNTER — Encounter: Payer: Self-pay | Admitting: Family Medicine

## 2011-08-29 ENCOUNTER — Ambulatory Visit (INDEPENDENT_AMBULATORY_CARE_PROVIDER_SITE_OTHER): Payer: Medicare Other | Admitting: Family Medicine

## 2011-08-29 VITALS — BP 112/72 | HR 82 | Temp 98.5°F | Ht 64.0 in | Wt 131.2 lb

## 2011-08-29 DIAGNOSIS — D5700 Hb-SS disease with crisis, unspecified: Secondary | ICD-10-CM

## 2011-08-29 DIAGNOSIS — D571 Sickle-cell disease without crisis: Secondary | ICD-10-CM

## 2011-08-29 LAB — POCT HEMOGLOBIN: Hemoglobin: 9.2 g/dL — AB (ref 14.1–18.1)

## 2011-08-29 MED ORDER — OXYCODONE-ACETAMINOPHEN 10-325 MG PO TABS: 1.0000 | ORAL_TABLET | Freq: Four times a day (QID) | ORAL | Status: DC | PRN

## 2011-08-29 MED ORDER — KETOROLAC TROMETHAMINE 30 MG/ML IJ SOLN
30.0000 mg | Freq: Once | INTRAMUSCULAR | Status: AC
  Administered 2011-08-29: 30 mg via INTRAMUSCULAR

## 2011-08-29 MED ORDER — OXYCODONE HCL 40 MG PO TB12: 40.0000 mg | ORAL_TABLET | Freq: Two times a day (BID) | ORAL | Status: DC

## 2011-08-29 NOTE — Progress Notes (Signed)
  Subjective:    Patient ID: [name] Meyer., male    DOB: [birth year], 31 y.o.   MRN: [id number]  Sickle Cell Pain Crisis This is a recurrent problem. The current episode started yesterday. The problem occurs constantly. The problem has been rapidly worsening. Associated symptoms include arthralgias. Pertinent negatives include no anorexia, chest pain, fever or nausea. Nothing aggravates the symptoms. He has tried rest and oral narcotics for the symptoms. The treatment provided no relief.   Here today for SS crisis.  Pain started yesterday and got worse today.  Would like an injection to keep him out of the hospital.  Is taking pain meds as directed, but will need refills soon and would like this today.  Pain is in extremeties.   Review of Systems  Constitutional: Negative for fever.  Cardiovascular: Negative for chest pain.  Gastrointestinal: Negative for nausea and anorexia.  Musculoskeletal: Positive for arthralgias.       Objective:   Physical Exam  Constitutional: He appears well-developed and well-nourished.  HENT:  Head: Normocephalic and atraumatic.  Neck: Neck supple.  Musculoskeletal:       Tender to palpation over humerus and tibia bilaterally          Assessment & Plan:

## 2011-08-29 NOTE — Assessment & Plan Note (Addendum)
Early on with pain crisis.  Needs refill on medicine and would like injection today to avoid the hospital.

## 2011-08-29 NOTE — Patient Instructions (Signed)
Sickle Cell Pain Crisis Sickle cell anemia requires regular medical attention by your healthcare provider and awareness about when to seek medical care. Pain is a common problem in children with sickle cell disease. This usually starts at less than 29 year of age. Pain can occur nearly anywhere in the body but most commonly occurs in the extremities, back, chest, or belly (abdomen). Pain episodes can start suddenly or may follow an illness. These attacks can appear as decreased activity, loss of appetite, change in behavior, or simply complaints of pain. DIAGNOSIS   Specialized blood and gene testing can help make this diagnosis early in the disease. Blood tests may then be done to watch blood levels.   Specialized brain scans are done when there are problems in the brain during a crisis.   Lung testing may be done later in the disease.  HOME CARE INSTRUCTIONS   Maintain good hydration. Increase you or your child's fluid intake in hot weather and during exercise.   Avoid smoking. Smoking lowers the oxygen in the blood and can cause the production of sickle-shaped cells (sickling).   Control pain. Only take over-the-counter or prescription medicines for pain, discomfort, or fever as directed by your caregiver. Do not give aspirin to children because of the association with Reye's syndrome.   Keep regular health care checks to keep a proper red blood cell (hemoglobin) level. A moderate anemia level protects against sickling crises.   You and your child should receive all the same immunizations and care as the people around you.   Mothers should breastfeed their babies if possible. Use formulas with iron added if breastfeeding is not possible. Additional iron should not be given unless there is a lack of it. People with sickle cell disease (SCD) build up iron faster than normal. Give folic acid and additional vitamins as directed.   If you or your child has been prescribed antibiotics or other  medications to prevent problems, take them as directed.   Summer camps are available for children with SCD. They may help young people deal with their disease. The camps introduce them to other children with the same problem.   Young people with SCD may become frustrated or angry at their disease. This can cause rebellion and refusal to follow medical care. Help groups or counseling may help with these problems.   Wear a medical alert bracelet. When traveling, keep medical information, caregiver's names, and the medications you or your child takes with you at all times.  SEEK IMMEDIATE MEDICAL CARE IF:   You or your child develops dizziness or fainting, numbness in or difficulty with movement of arms and legs, difficulty with speech, or is acting abnormally. This could be early signs of a stroke. Immediate treatment is necessary.   You or your child has an oral temperature above 102 F (38.9 C), not controlled by medicine.   You or your child has other signs of infection (chills, lethargy, irritability, poor eating, vomiting). The younger the child, the more you should be concerned.   With fevers, do not give medicine to lower the fever right away. This could cover up a problem that is developing. Notify your caregiver.   You or your child develops pain that is not helped with medicine.   You or your child develops shortness of breath or is coughing up pus-like or bloody sputum.   You or your child develops any problems that are new and are causing you to worry.   You or  your child develops a persistent, often uncomfortable and painful penile erection. This is called priapism. Always check young boys for this. It is often embarrassing for them and they may not bring it to your attention. This is a medical emergency and needs immediate treatment. If this is not treated it will lead to impotence.   You or your child develops a new onset of abdominal pain, especially on the left side near the  stomach area.   You or your child has any questions or has problems that are not getting better. Return immediately if you feel your child is getting worse, even if your child was seen only a short while ago.  Document Released: 11/09/2004 Document Revised: 01/19/2011 Document Reviewed: 03/31/2009 [facility] Patient Information 2012 [place].

## 2011-08-31 IMAGING — CR DG NECK SOFT TISSUE
1 series · 1 of 1 positions shown · non-contrast
Comparison: CT head 03/06/2009

CLINICAL DATA: Swelling.  Facial swelling and sore throat after
eating at [employer].

NECK SOFT TISSUES - 1+ VIEW

[w soft tissue neck]
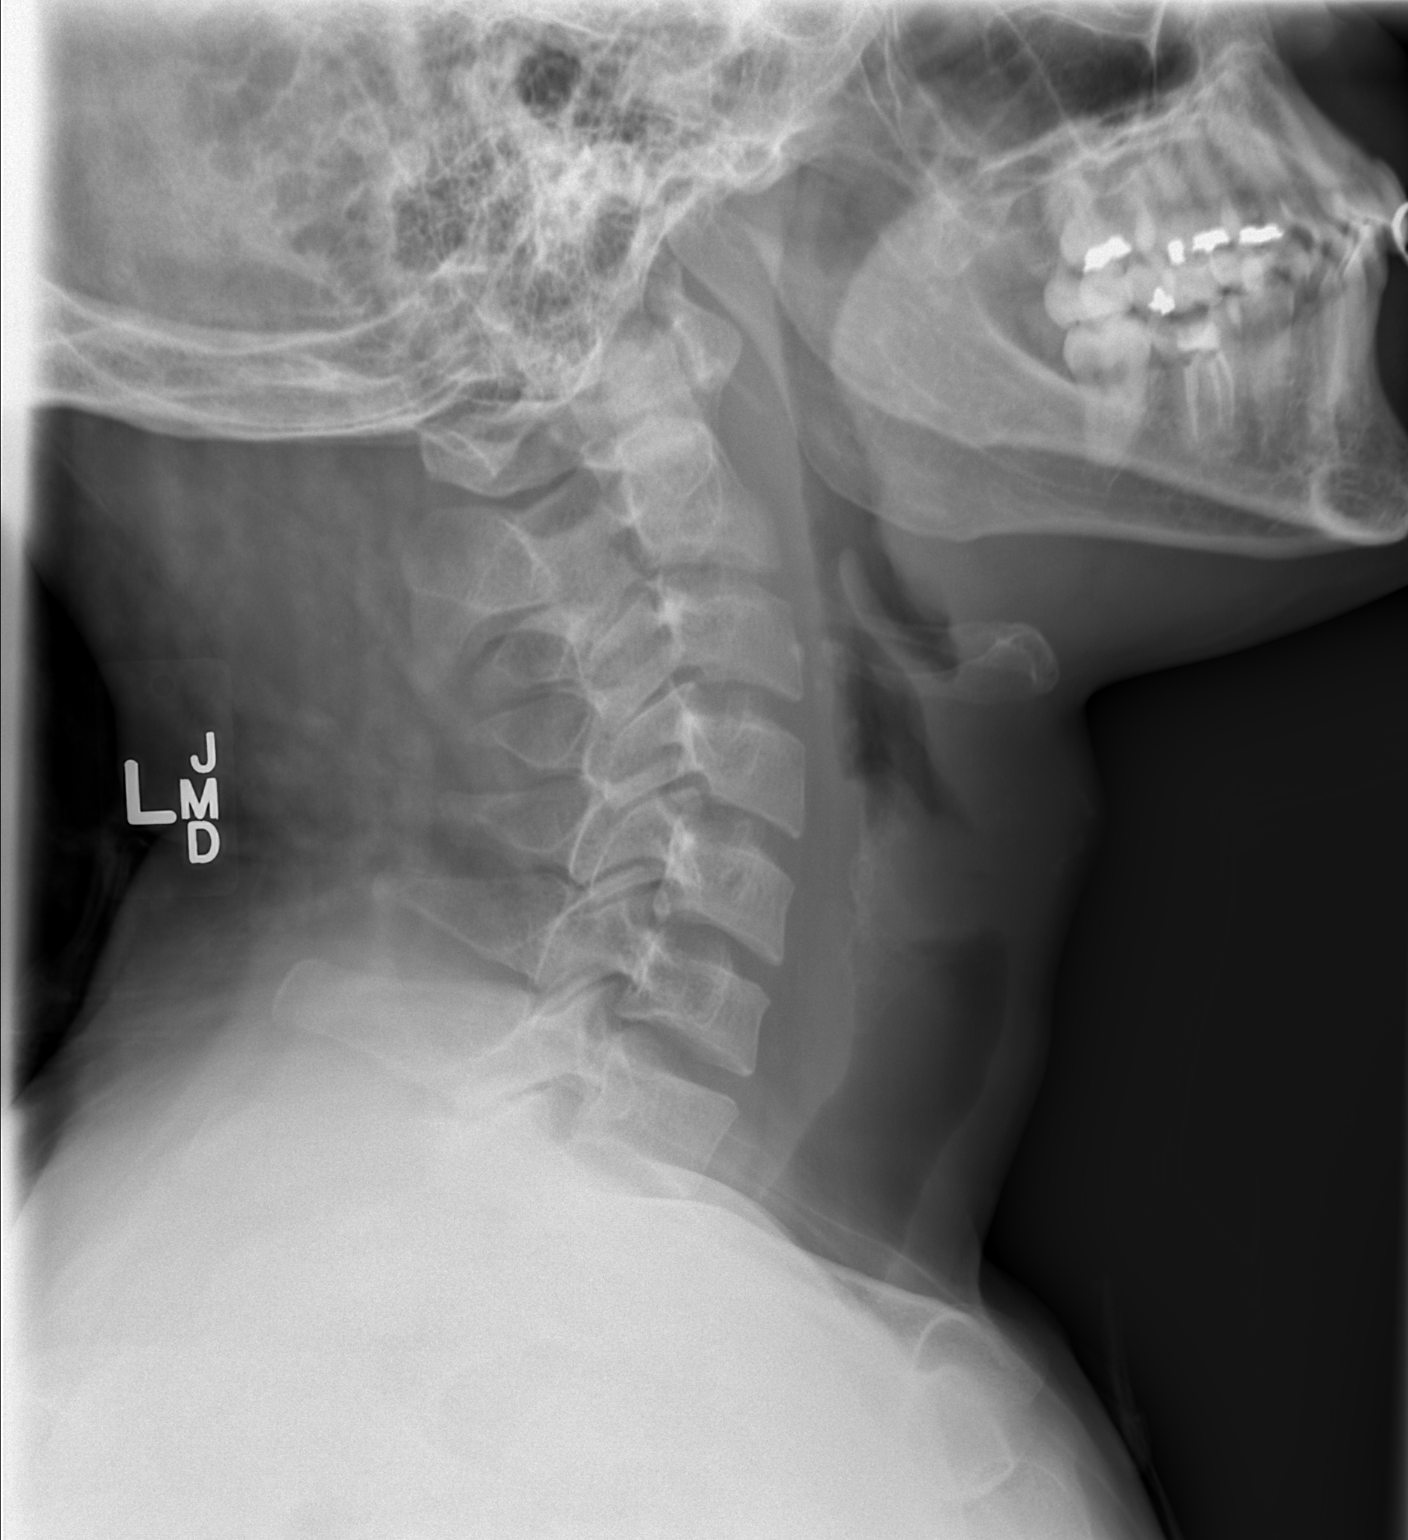

[1 of 1 positions shown; findings below may reference images not displayed]

FINDINGS: Prevertebral soft tissues have a normal appearance.  No
evidence for soft tissue mass, edema.  Visualized portion of the
cervical spine is unremarkable in appearance.
IMPRESSION: Negative exam.

## 2011-09-02 IMAGING — CT CT NECK W/O CM
4 of 7 series · 13 of 33 positions shown, 15 images · non-contrast
Comparison: Head CT scan 03/06/2009 and brain MRI 02/22/2007.  CT
neck 03/27/2010.

CT HEAD

CLINICAL DATA: Sickle cell patient.  Headaches.

CT HEAD WITHOUT CONTRAST
CT NECK WITHOUT CONTRAST
TECHNIQUE: Contiguous axial images were obtained from the base of
the skull through the vertex without contrast. Multidetector CT
imaging of the neck was performed using the standard protocol
without intravenous contrast.

[Series 4: 2cc/30ml and 1cc/45ml · axial · 0.44mm/px · z∈[-249,-184]mm · 2 of 78 slices shown]
[im 26/78  bone]
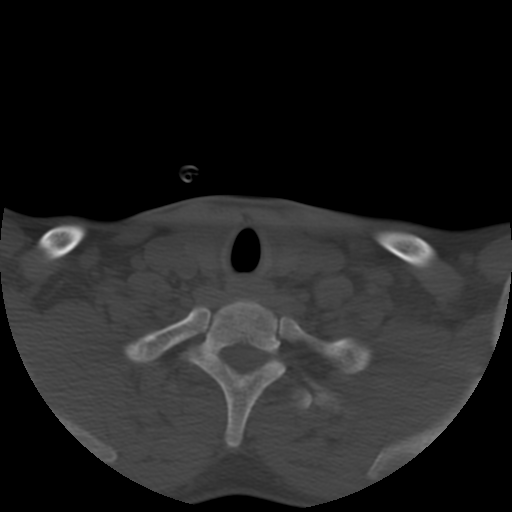
[im 52/78  bone]
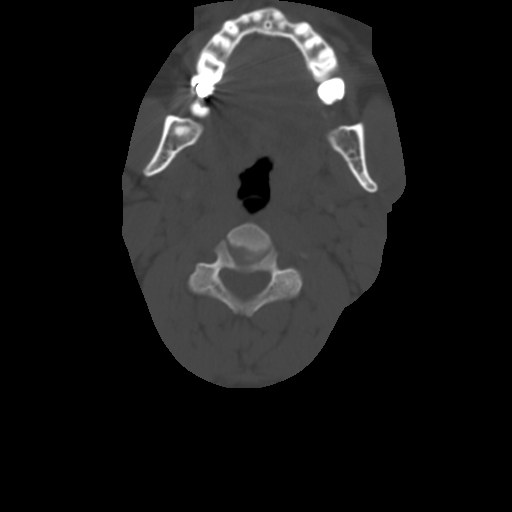

[Series 500: sagittals · sagittal · 0.44mm/px · 5 of 78 slices shown, 6 images]
[im 26/78  bone]
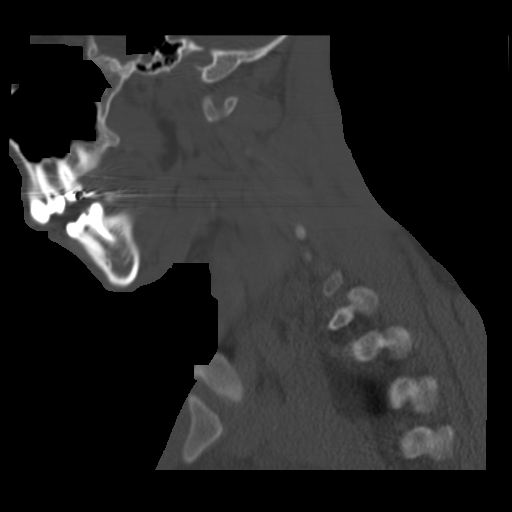
[im 33/78  bone]
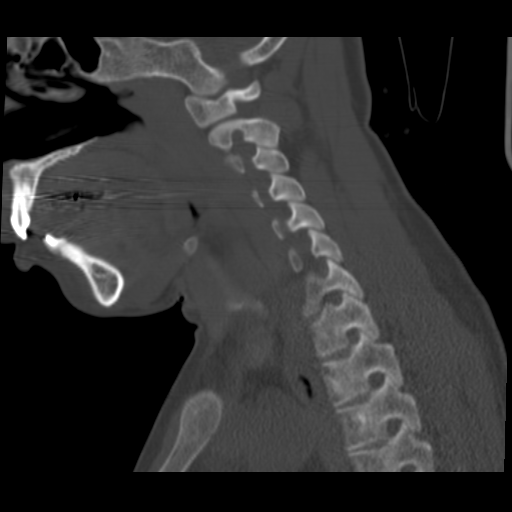
[im 39/78  soft-tissue]
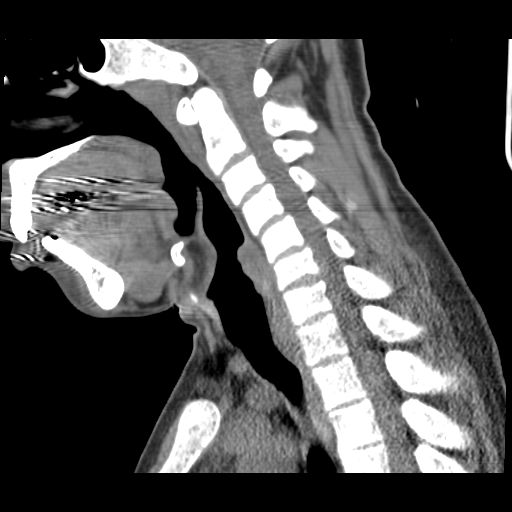
[im 39/78  bone]
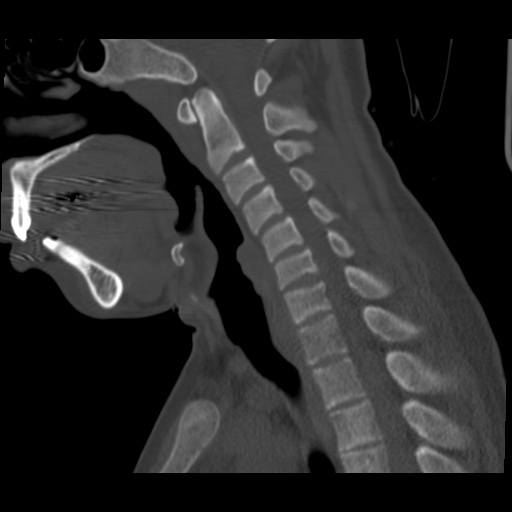
[im 45/78  bone]
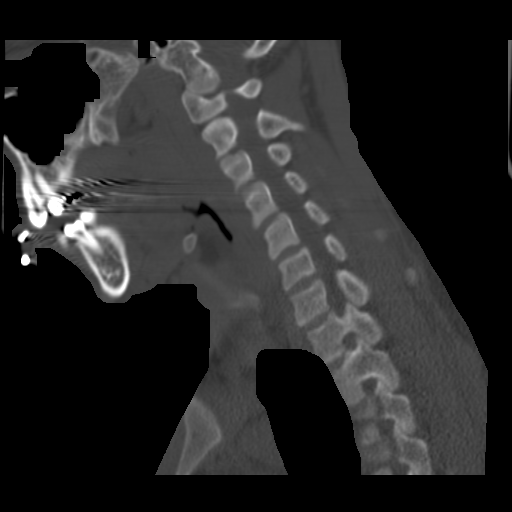
[im 52/78  bone]
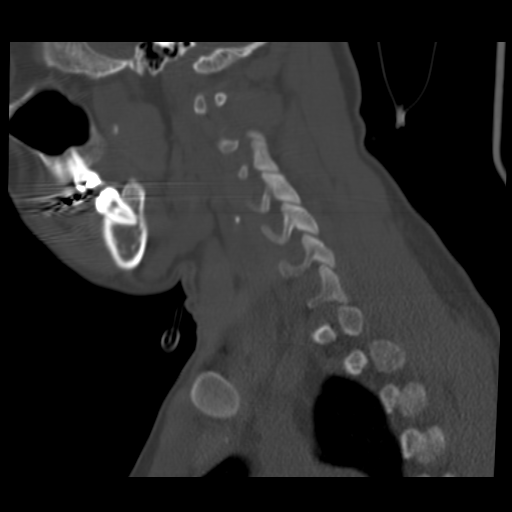

[Series 501: cor · coronal · 0.44mm/px · 3 of 81 slices shown]
[im 24/81  bone]
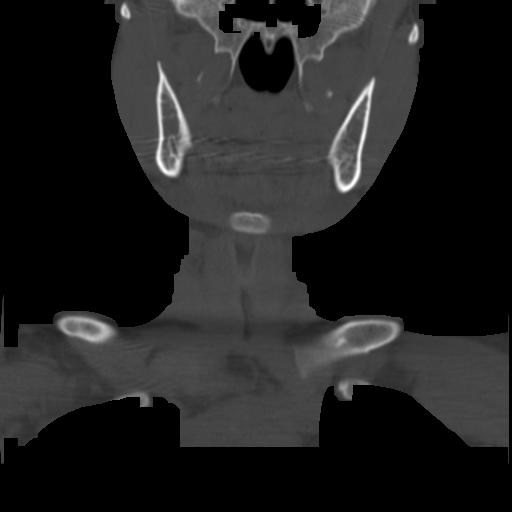
[im 35/81  bone]
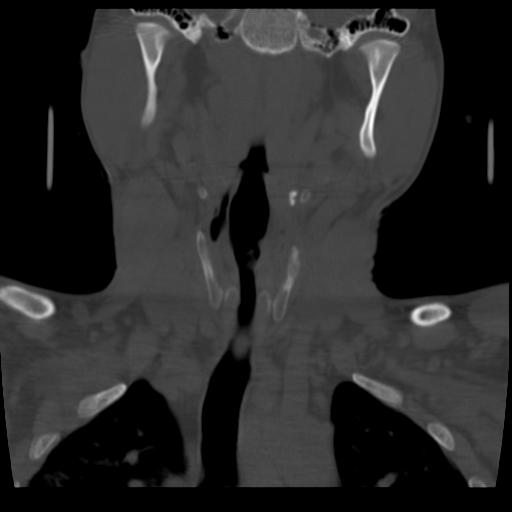
[im 46/81  bone]
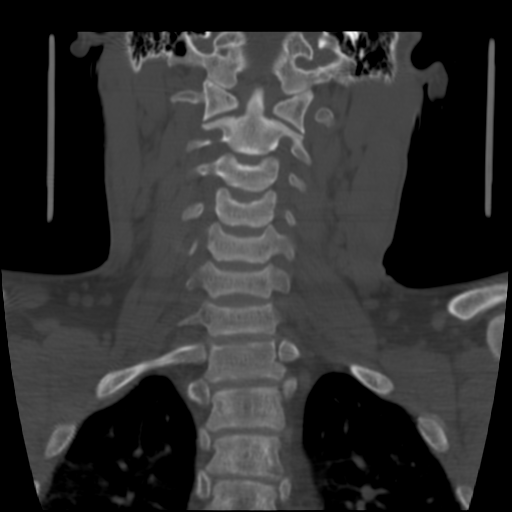

[Series 502: axials · axial · 0.44mm/px · z∈[-295,-208]mm · 3 of 95 slices shown, 4 images]
[im 24/95  soft-tissue]
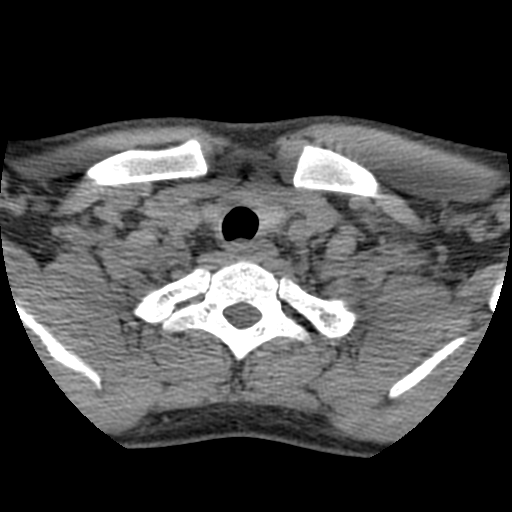
[im 24/95  bone]
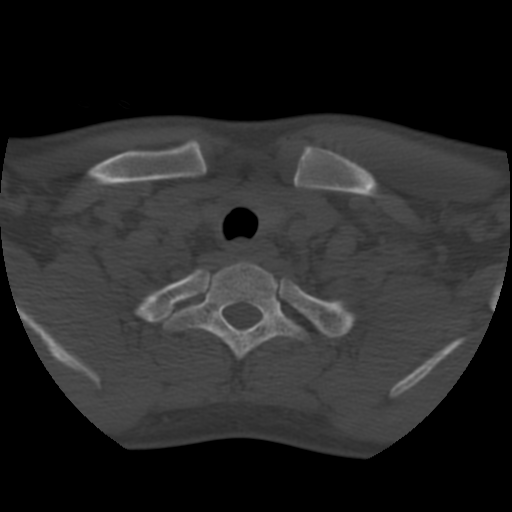
[im 48/95  bone]
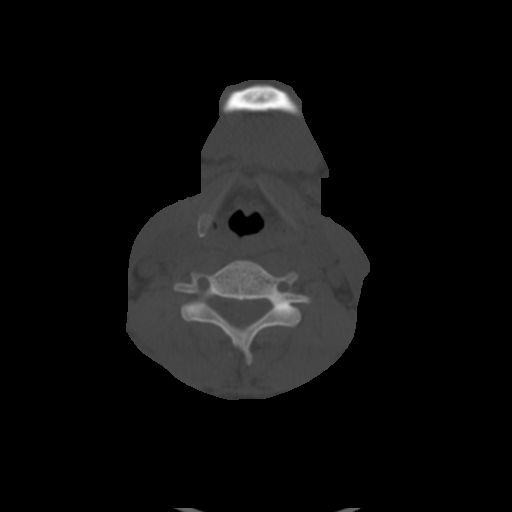
[im 71/95  bone]
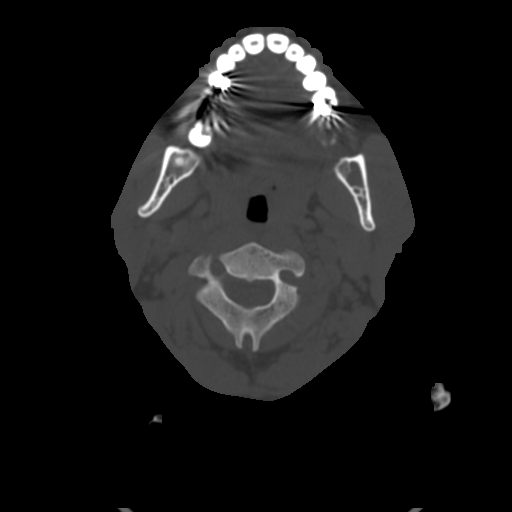

[13 of 33 positions shown; findings below may reference images not displayed]

FINDINGS: Again seen is a bilateral Franck Patrick Ledou volume loss likely
due to prior infarct.  The appearance is unchanged.  No evidence of
acute infarction, hemorrhage, mass lesion, mass effect, midline
shift or abnormal extra-axial fluid collection.  No hydrocephalus.
Calvarium is intact.  Imaged paranasal sinuses and mastoid air
cells are clear.
IMPRESSION: 1.  No acute finding.
2.  Unchanged bilateral Franck Patrick Ledou volume loss, worse on the
left.

CT NECK
FINDINGS: Lack of IV contrast material limits evaluation for
abscess.  Given this limitation, no abscess or other fluid
collection is identified.  No pathologic lymphadenopathy by CT size
criteria.  Small cyst in the left parotid gland seen on the
comparison study is not well demonstrated with this examination.
Fat planes of the neck appear preserved.  No mass is identified.
Likely mild thickening  of Waldeyer's ring is unchanged.
Aerodigestive tract is unremarkable.  Lung apices are clear.
IMPRESSION: No acute finding in the neck.  No change since the study 1 day ago.

## 2011-09-02 IMAGING — CR DG CHEST 2V
2 series · 2 of 2 positions shown · non-contrast
Comparison: PA and lateral chest 03/15/2010.

CLINICAL DATA: Sickle cell disease.  Pain.

CHEST - 2 VIEW

[w chest lat]
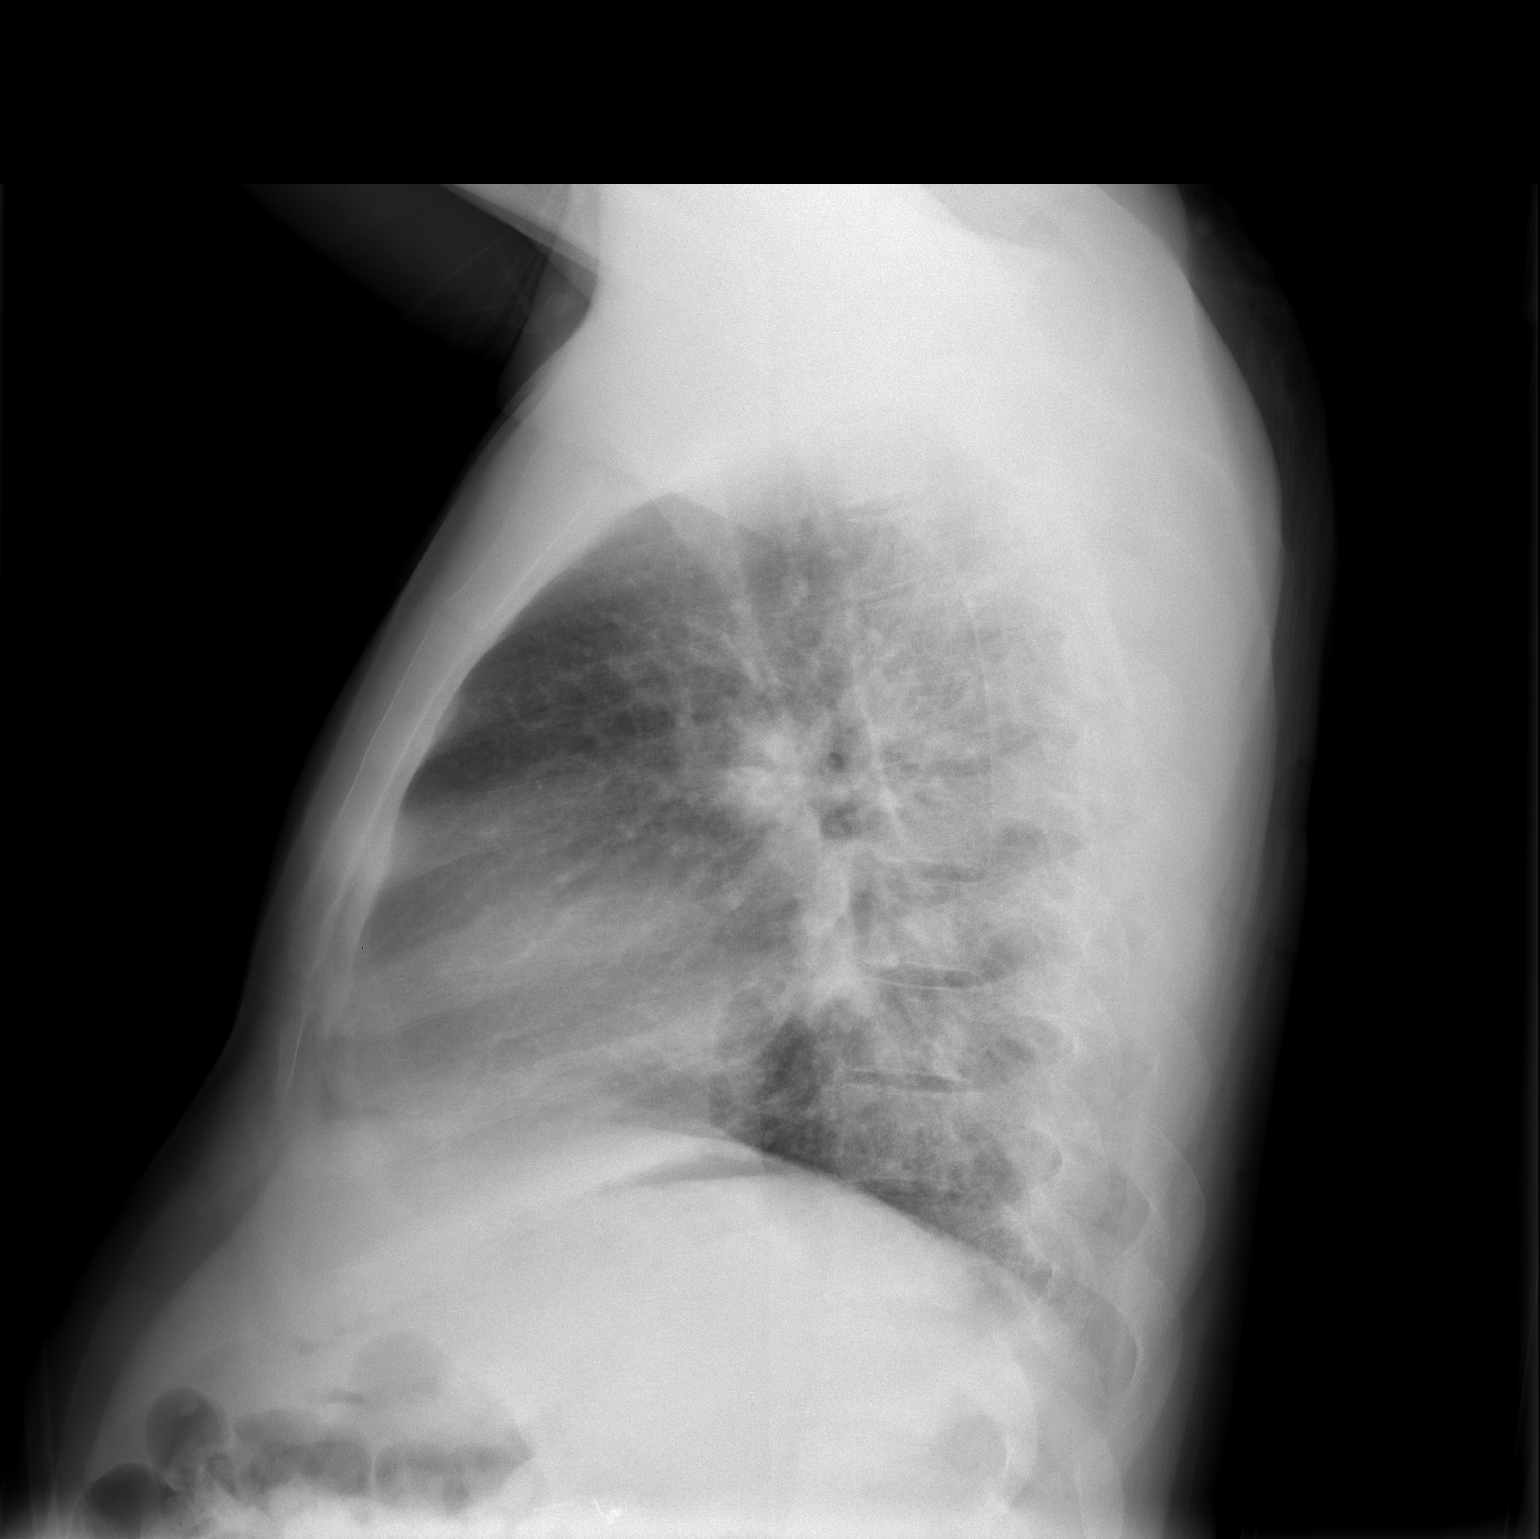

[view not recorded]
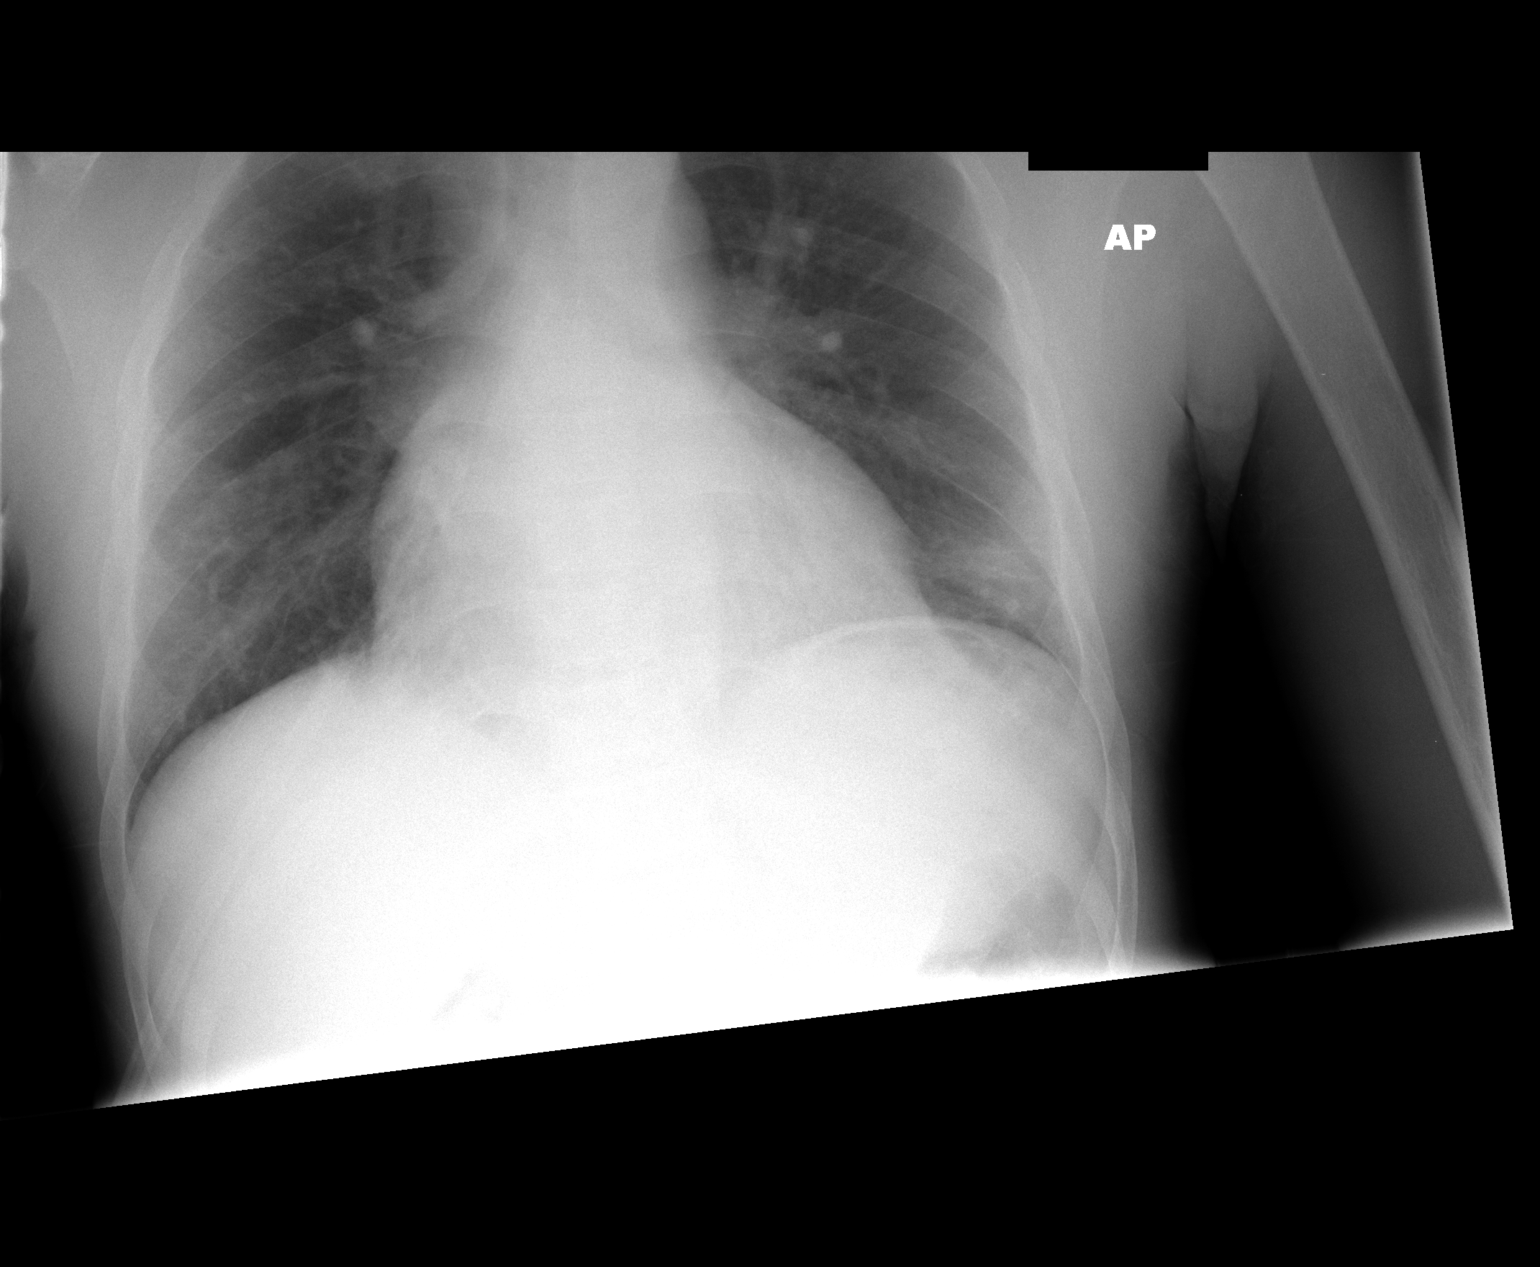

[2 of 2 positions shown; findings below may reference images not displayed]

FINDINGS: Left basilar scarring is unchanged.  Heart size is mildly
enlarged.  No pneumothorax or pleural effusion.
IMPRESSION: No acute finding.  Stable from the prior exam.

## 2011-09-16 ENCOUNTER — Other Ambulatory Visit: Payer: Self-pay | Admitting: Family Medicine

## 2011-10-09 ENCOUNTER — Encounter: Payer: Self-pay | Admitting: Family Medicine

## 2011-10-09 ENCOUNTER — Ambulatory Visit (INDEPENDENT_AMBULATORY_CARE_PROVIDER_SITE_OTHER): Payer: Medicare Other | Admitting: Family Medicine

## 2011-10-09 VITALS — BP 115/68 | HR 109 | Temp 98.4°F | Ht 64.0 in | Wt 125.0 lb

## 2011-10-09 DIAGNOSIS — D571 Sickle-cell disease without crisis: Secondary | ICD-10-CM

## 2011-10-09 DIAGNOSIS — I739 Peripheral vascular disease, unspecified: Secondary | ICD-10-CM

## 2011-10-09 DIAGNOSIS — E039 Hypothyroidism, unspecified: Secondary | ICD-10-CM

## 2011-10-09 DIAGNOSIS — D5700 Hb-SS disease with crisis, unspecified: Secondary | ICD-10-CM

## 2011-10-09 LAB — CBC
HCT: 23.7 % — ABNORMAL LOW (ref 39.0–52.0)
Hemoglobin: 8.3 g/dL — ABNORMAL LOW (ref 13.0–17.0)
MCH: 41.1 pg — ABNORMAL HIGH (ref 26.0–34.0)
MCV: 117.3 fL — ABNORMAL HIGH (ref 78.0–100.0)
Platelets: 313 10*3/uL (ref 150–400)
RBC: 2.02 MIL/uL — ABNORMAL LOW (ref 4.22–5.81)
WBC: 12.3 10*3/uL — ABNORMAL HIGH (ref 4.0–10.5)

## 2011-10-09 MED ORDER — OXYCODONE-ACETAMINOPHEN 10-325 MG PO TABS: 1.0000 | ORAL_TABLET | Freq: Three times a day (TID) | ORAL | Status: AC | PRN

## 2011-10-09 MED ORDER — OXYCODONE HCL 40 MG PO TB12: 40.0000 mg | ORAL_TABLET | Freq: Two times a day (BID) | ORAL | Status: DC

## 2011-10-09 MED ORDER — ENALAPRIL MALEATE 5 MG PO TABS: 5.0000 mg | ORAL_TABLET | Freq: Every day | ORAL | Status: DC

## 2011-10-09 MED ORDER — OXYCODONE-ACETAMINOPHEN 10-325 MG PO TABS: 1.0000 | ORAL_TABLET | Freq: Four times a day (QID) | ORAL | Status: DC | PRN

## 2011-10-09 MED ORDER — HYDROXYUREA 500 MG PO CAPS: 1500.0000 mg | ORAL_CAPSULE | Freq: Every day | ORAL | Status: DC

## 2011-10-09 MED ORDER — KETOROLAC TROMETHAMINE 30 MG/ML IJ SOLN
30.0000 mg | Freq: Once | INTRAMUSCULAR | Status: AC
  Administered 2011-10-09: 30 mg via INTRAMUSCULAR

## 2011-10-09 NOTE — Progress Notes (Signed)
  Subjective:    Patient ID: [name] [name]., male    DOB: 01-Feb-1981, 31 y.o.   MRN: [id number]  HPI # Sickle cell pain, here for 70-month narcotic refills He uses 2-3 Percocets daily. His pain is well controlled on this as well as Oxycontin 40 mg bid.  He is complaining of some diffuse bony acheyness with the change in weather (it is cooler today) He denies difficulty breathing, nausea  He reports compliance with enalapril and hydroxyurea  Review of Systems Per HPI    Objective:   Physical Exam GEN: NAD PULM: NI WOB; good air movement, CTAB without wheezes or rales    Assessment & Plan:

## 2011-10-09 NOTE — Assessment & Plan Note (Signed)
WNL 04/2011

## 2011-10-09 NOTE — Patient Instructions (Addendum)
We will make an appointment for you to see Dr. Marlou Sa.   I am hoping he will follow-up on your sickle cell issues, including your pain medications  If he recommends you follow-up with [place], however, please make an appointment before November 1 so you receive your pain medication prescriptions in time

## 2011-10-09 NOTE — Assessment & Plan Note (Addendum)
He has diffuse bony acheyness today due to weather changes -Will give Toradol 30 mg IM. Will check CBC>>>Hgb 8.3 (baseline) -Will refill chronic narcotics. He uses 100-110 mg of oxycodone daily. If I wanted to increase his longer-acting, he would require 50 mg daily, and the tablets are in 40 and 60 mg formulations. Will continue regimen, OxyContin 40 mg bid and oxycodone 10/325, 2-3 tablets prn for breakthrough. I feel uncomfortable giving him such a large number of short-acting narcotics. He reports he uses all of the 80 tablets a month. However, his pain is well controlled on this regimen, and he is not requesting increase in dosage.   He is scheduled to get his medications the first of the month.  -Will refer him to Dr. Marlou Sa, sickle cell specialist for evaluation versus chronic management of sickle cell. He was previously see at [facility], however, he is unable to make the drive due to financial concerns.  -Continue enalapril and hydroxyurea. He reports compliance, however, he does not refill these medications regularly.

## 2011-10-22 IMAGING — CR DG CHEST 2V
2 series · 2 of 2 positions shown · non-contrast
Comparison: 03/18/2010

CLINICAL DATA: Pain in legs.  Sickle cell disease.

CHEST - 2 VIEW

[w chest pa]
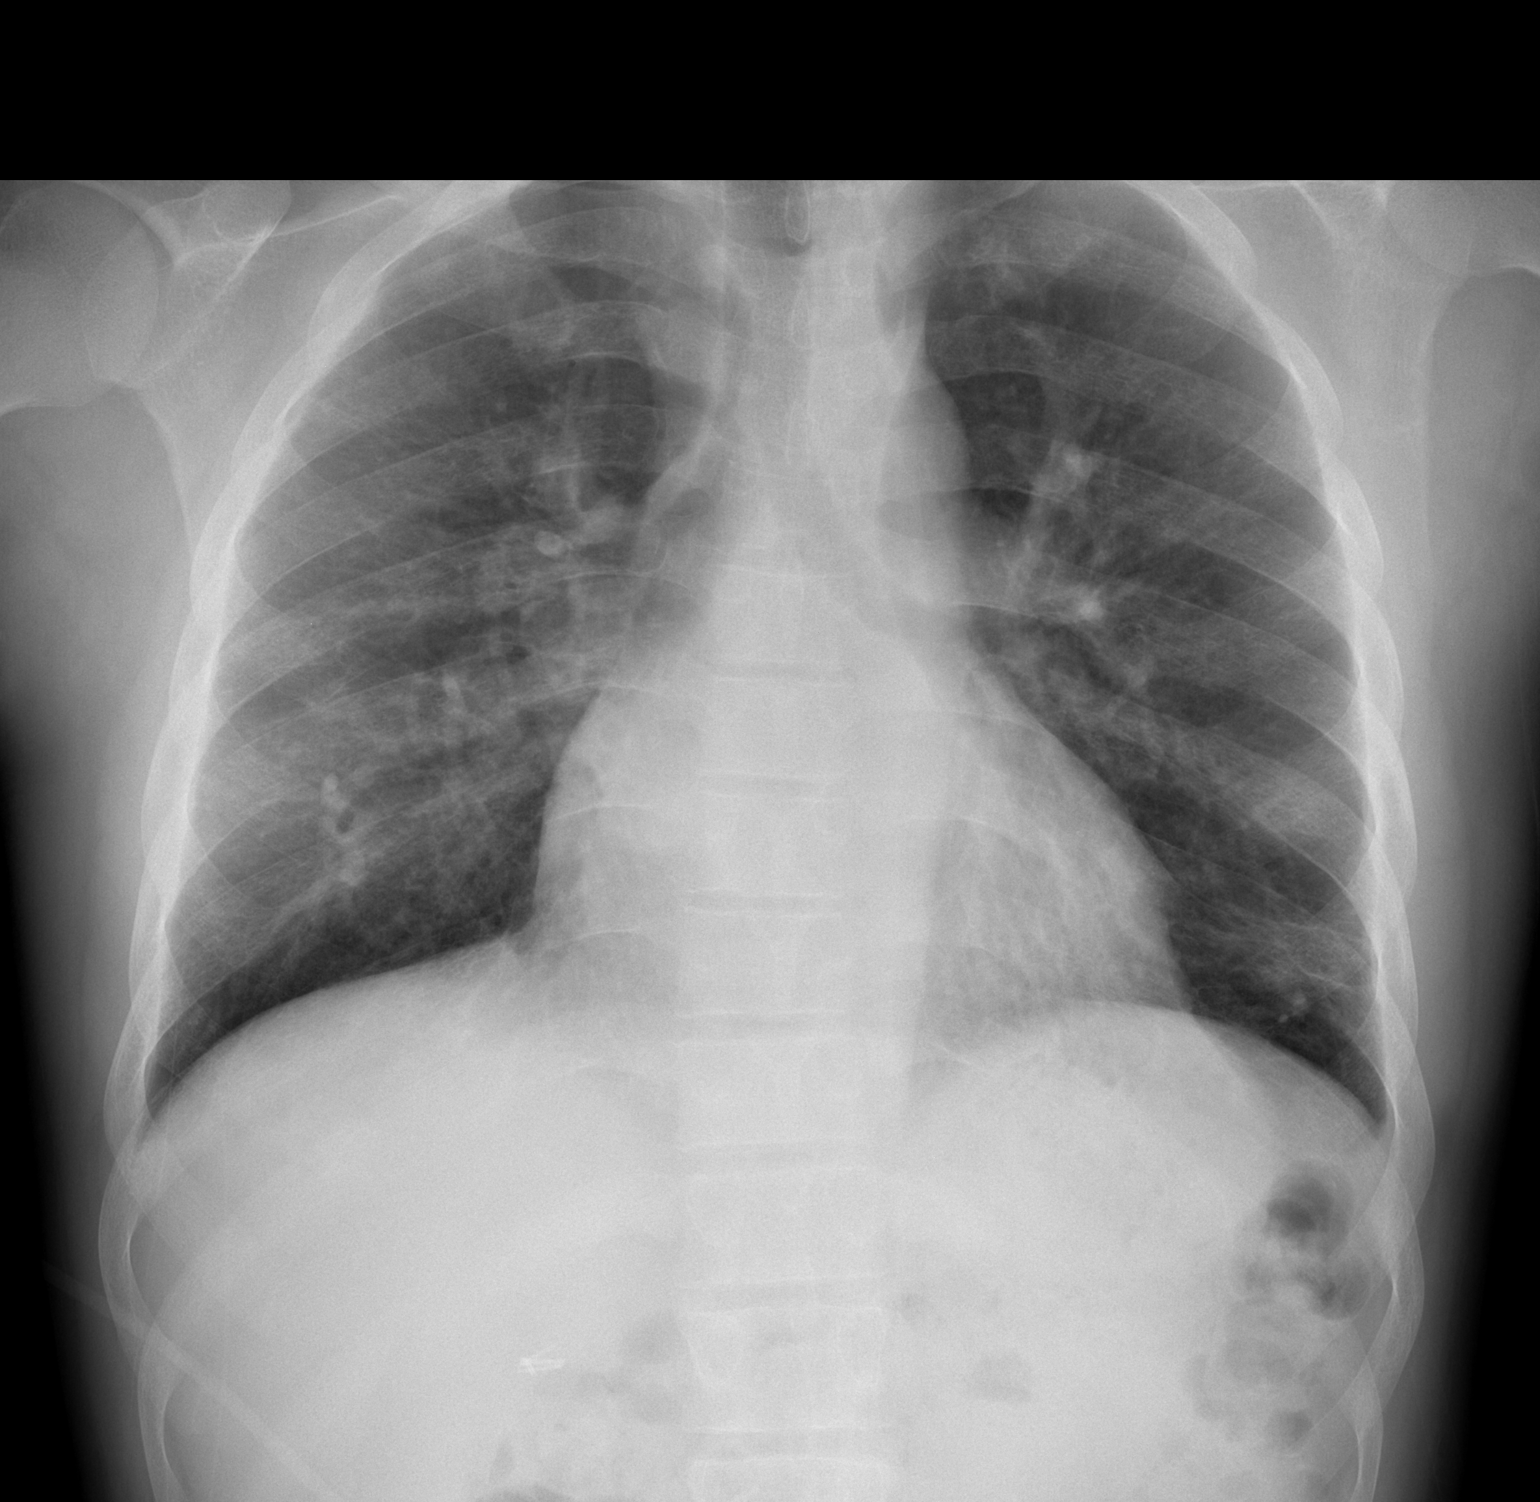

[w chest lat]
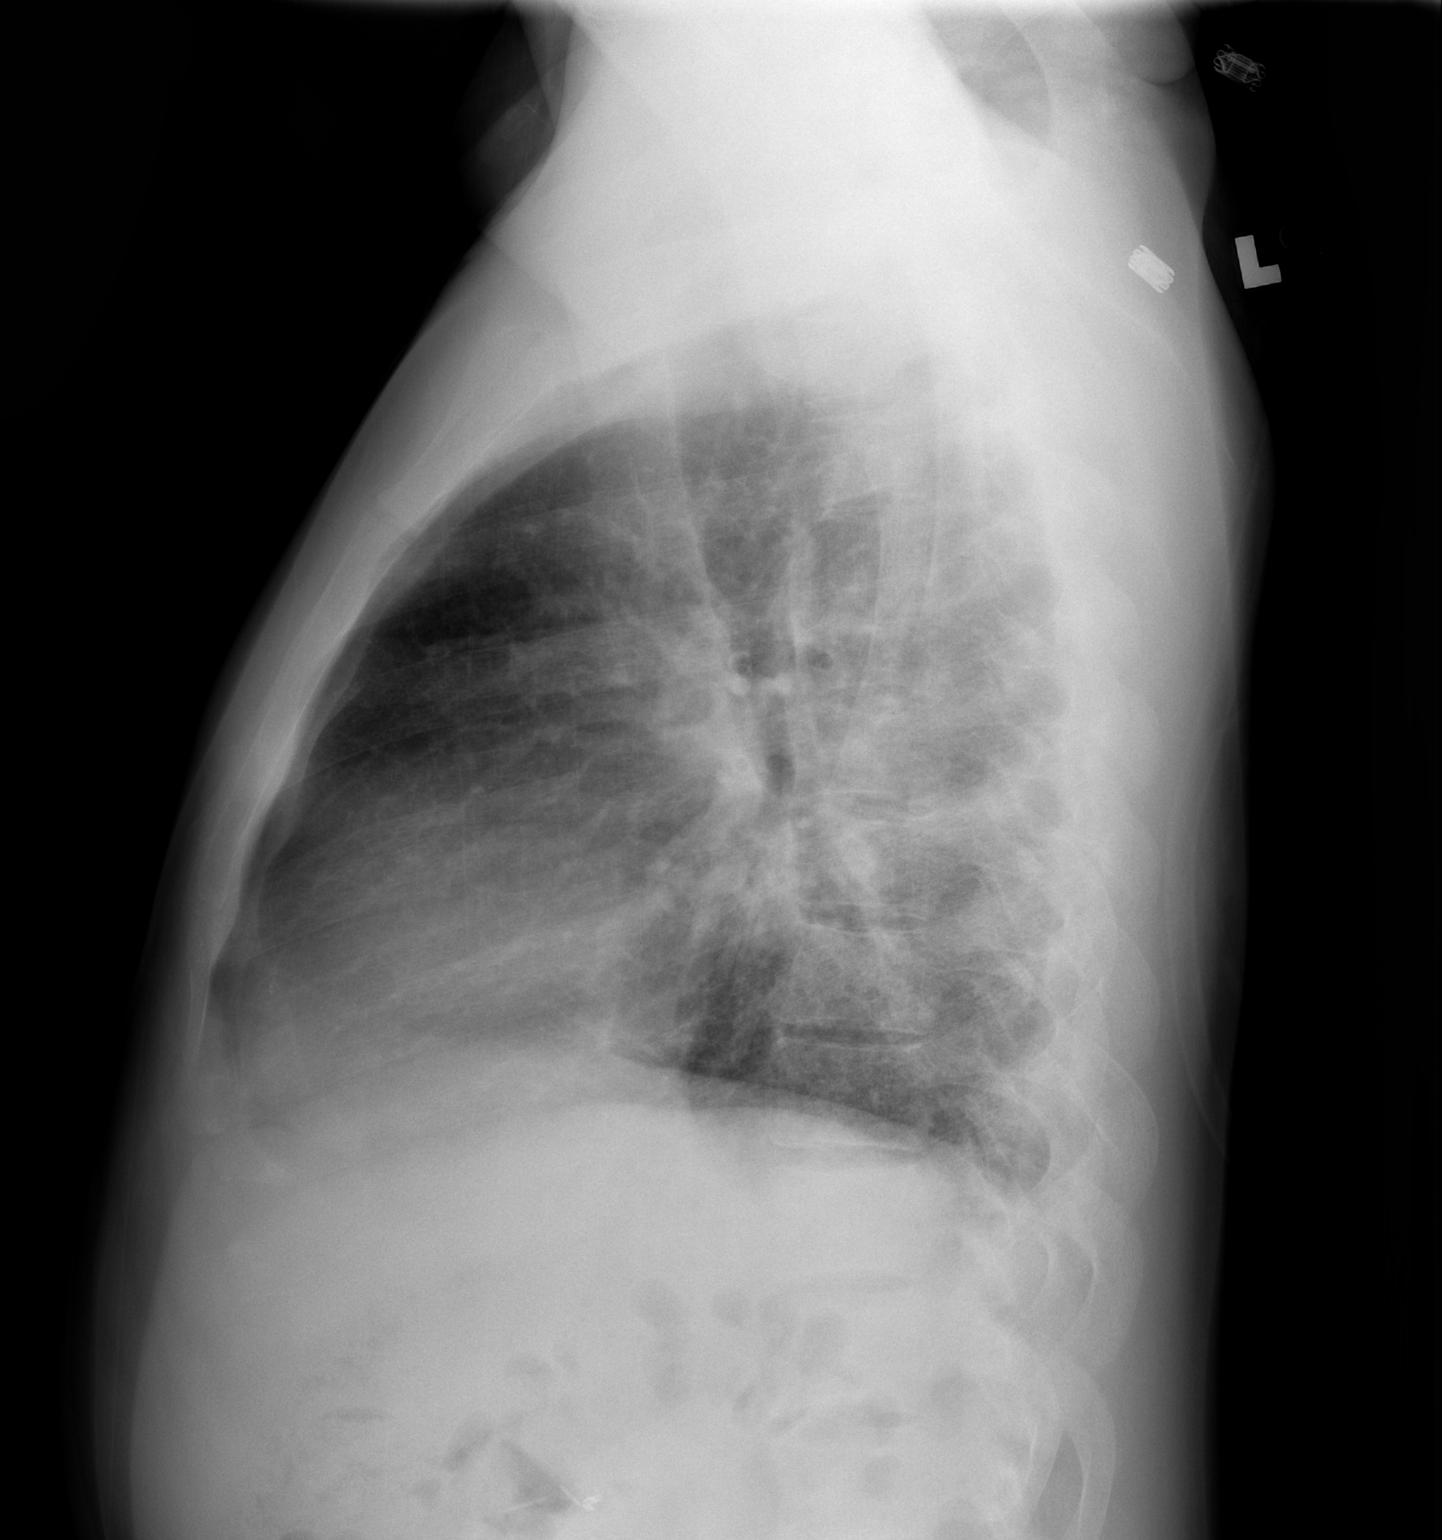

[2 of 2 positions shown; findings below may reference images not displayed]

FINDINGS: Mild cardiac enlargement is stable from prior exam.

No pleural effusion or pulmonary edema

Bilateral areas of scarring and coarsened interstitial markings
identified, unchanged from previous exam.

No superimposed airspace consolidation identified.

Review of the visualized osseous structures shows skeletal changes
of sickle cell disease.
IMPRESSION: 1.  Chronic increased interstitial markings and parenchymal
scarring.
2.  Mild cardiac enlargement.

## 2011-10-23 IMAGING — CR DG CHEST 1V PORT
1 series · 1 of 1 positions shown · non-contrast
Comparison: 05/17/2010

CLINICAL DATA: Assess for infiltrates.  History of sickle cell
crisis.

PORTABLE CHEST - 1 VIEW

[AP]
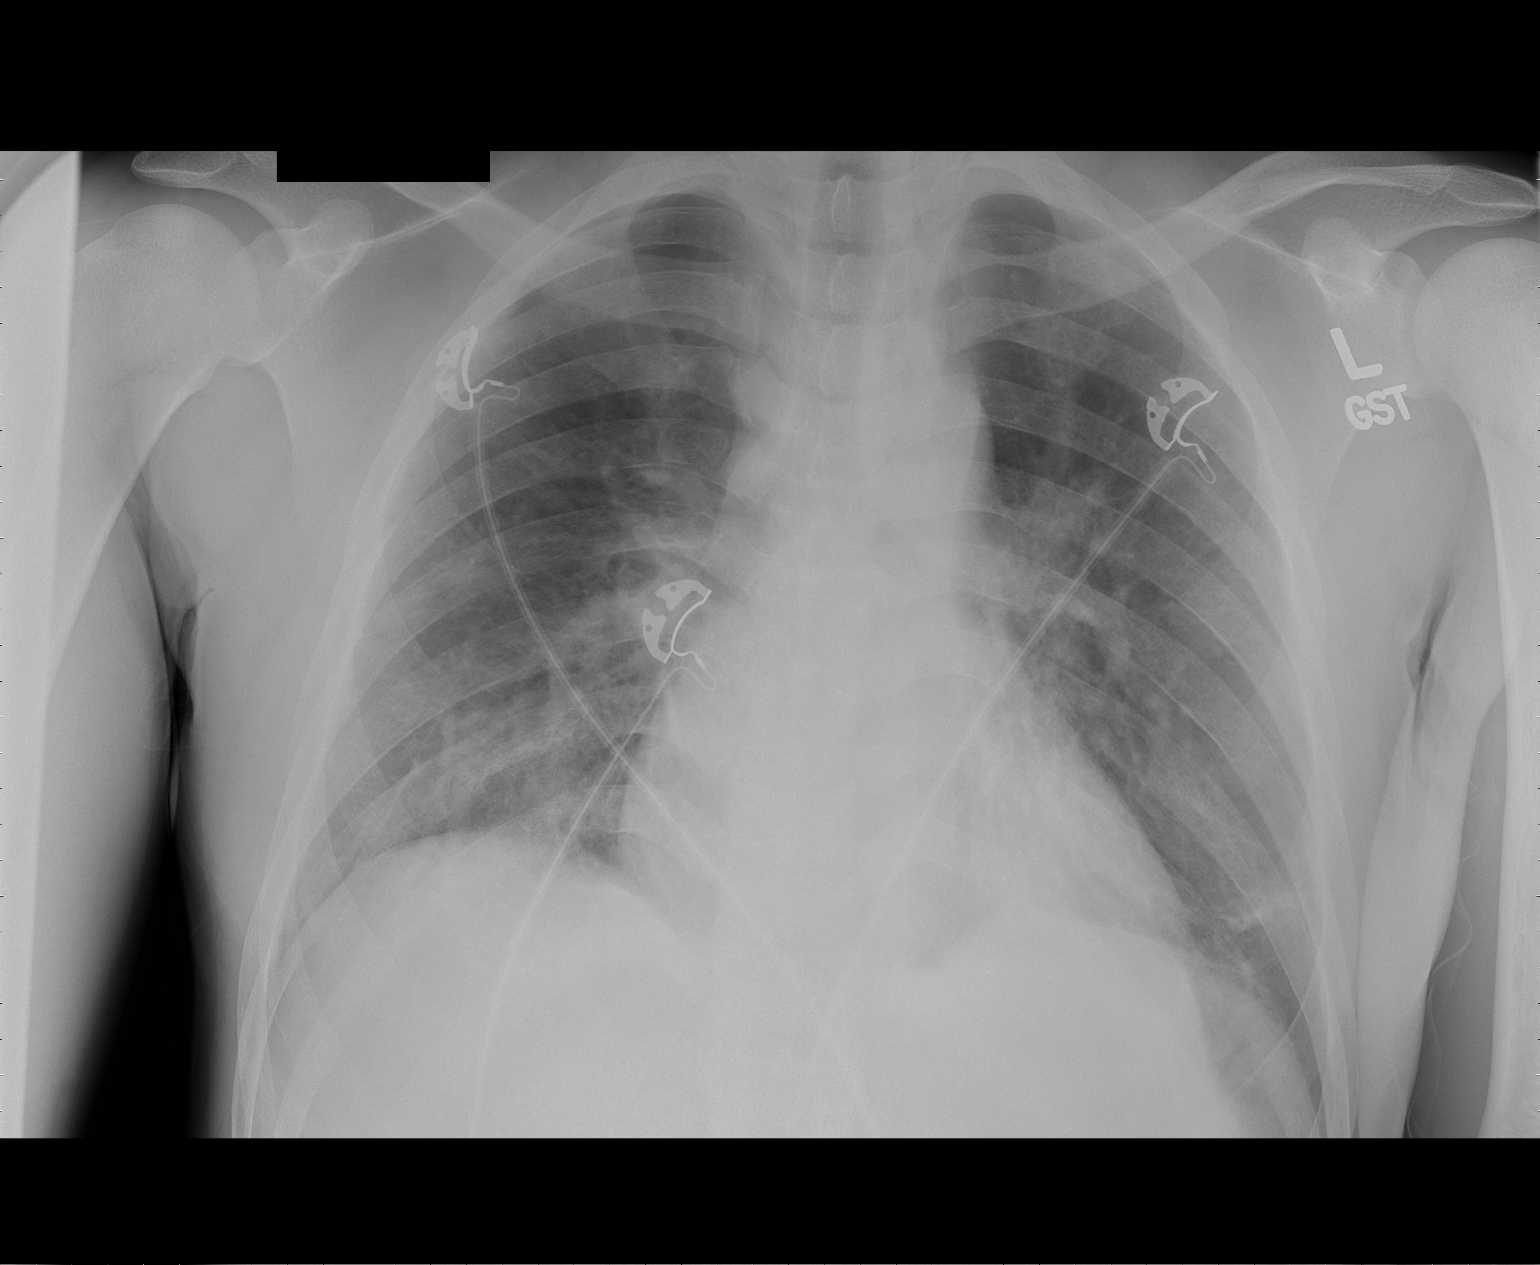

[1 of 1 positions shown; findings below may reference images not displayed]

FINDINGS: Worsening aeration with new bilateral lower lobe
pulmonary opacities consistent with infiltrate or atelectasis.  No
effusion.  No pneumothorax.  Mild cardiac enlargement persists.  No
acute osseous findings.
IMPRESSION: Worsening aeration.  Interval development of bilateral lower lobe
pulmonary opacities.  Mild cardiomegaly.

## 2011-12-08 ENCOUNTER — Encounter: Payer: Self-pay | Admitting: Family Medicine

## 2011-12-08 ENCOUNTER — Ambulatory Visit (INDEPENDENT_AMBULATORY_CARE_PROVIDER_SITE_OTHER): Payer: Medicare Other | Admitting: Family Medicine

## 2011-12-08 VITALS — BP 110/61 | HR 75 | Temp 98.6°F | Ht 64.0 in | Wt 124.0 lb

## 2011-12-08 DIAGNOSIS — F17200 Nicotine dependence, unspecified, uncomplicated: Secondary | ICD-10-CM

## 2011-12-08 DIAGNOSIS — D571 Sickle-cell disease without crisis: Secondary | ICD-10-CM

## 2011-12-08 DIAGNOSIS — R000 Tachycardia, unspecified: Secondary | ICD-10-CM

## 2011-12-08 DIAGNOSIS — D5700 Hb-SS disease with crisis, unspecified: Secondary | ICD-10-CM

## 2011-12-08 DIAGNOSIS — G43009 Migraine without aura, not intractable, without status migrainosus: Secondary | ICD-10-CM

## 2011-12-08 DIAGNOSIS — R011 Cardiac murmur, unspecified: Secondary | ICD-10-CM

## 2011-12-08 DIAGNOSIS — Z23 Encounter for immunization: Secondary | ICD-10-CM

## 2011-12-08 MED ORDER — OXYCODONE HCL 40 MG PO TB12: 40.0000 mg | ORAL_TABLET | Freq: Two times a day (BID) | ORAL | Status: DC

## 2011-12-08 MED ORDER — NORTRIPTYLINE HCL 25 MG PO CAPS: 25.0000 mg | ORAL_CAPSULE | Freq: Every day | ORAL | Status: DC

## 2011-12-08 MED ORDER — OXYCODONE-ACETAMINOPHEN 10-325 MG PO TABS: 1.0000 | ORAL_TABLET | Freq: Three times a day (TID) | ORAL | Status: DC | PRN

## 2011-12-08 MED ORDER — HYDROXYUREA 500 MG PO CAPS: 1500.0000 mg | ORAL_CAPSULE | Freq: Every day | ORAL | Status: DC

## 2011-12-08 MED ORDER — KETOROLAC TROMETHAMINE 30 MG/ML IJ SOLN
30.0000 mg | Freq: Once | INTRAMUSCULAR | Status: AC
  Administered 2011-12-08: 30 mg via INTRAMUSCULAR

## 2011-12-08 MED ORDER — ENALAPRIL MALEATE 5 MG PO TABS: 5.0000 mg | ORAL_TABLET | Freq: Every day | ORAL | Status: DC

## 2011-12-08 MED ORDER — OXYCODONE-ACETAMINOPHEN 10-325 MG PO TABS: 1.0000 | ORAL_TABLET | Freq: Four times a day (QID) | ORAL | Status: DC | PRN

## 2011-12-08 NOTE — Progress Notes (Signed)
  Subjective:    Patient ID: [name] [name]., male    DOB: 1980/12/23, 31 y.o.   MRN: [id number]  HPI # Sickle cell disease, SS He is feeling more tired after the weather became cooler He is complaining of mild vague pain but does not feel he is having a crisis    Review of Systems Denies difficulty breathing  Denies chest pain or palpitations Denies dehydration. He drinks water "all the time"  Allergies, medication, past medical history reviewed.   09/2009 2D-ECHO reviewed; EF 60-65%, normal, including valves    Objective:   Physical Exam GEN: NAD CV: RRR, grade 3 systolic murmur heard best RUSB PULM: NI WOB; CTAB without w/r/r ABD: soft, NT, ND EXT: no edema; no significant tenderness extremities x 4    Assessment & Plan:

## 2011-12-08 NOTE — Assessment & Plan Note (Signed)
Will check Hgb. Will re-evaluate at 3 month follow-up visit.

## 2011-12-08 NOTE — Assessment & Plan Note (Signed)
Contine Oxycontin 40 mg bid, percocet 10/325 2-3 tablets daily (#90 per month). 3 months' worth of prescription given.  Continue hydroxyurea, enalapril.

## 2011-12-08 NOTE — Patient Instructions (Addendum)
I will call you with lab results. If you don't hear from me by next Friday, call the clinic and remind me please.   If you have chest pain or palpitations, please return to clinic or go to the ED Otherwise, follow-up in 3 months (before your January prescriptions run out)

## 2011-12-12 IMAGING — CR DG CHEST 2V
2 series · 2 of 2 positions shown · non-contrast
Comparison: Two-view chest x-ray 05/23/2010.

CLINICAL DATA: Sickle cell crisis.  Pain.

CHEST - 2 VIEW

[w chest lat]
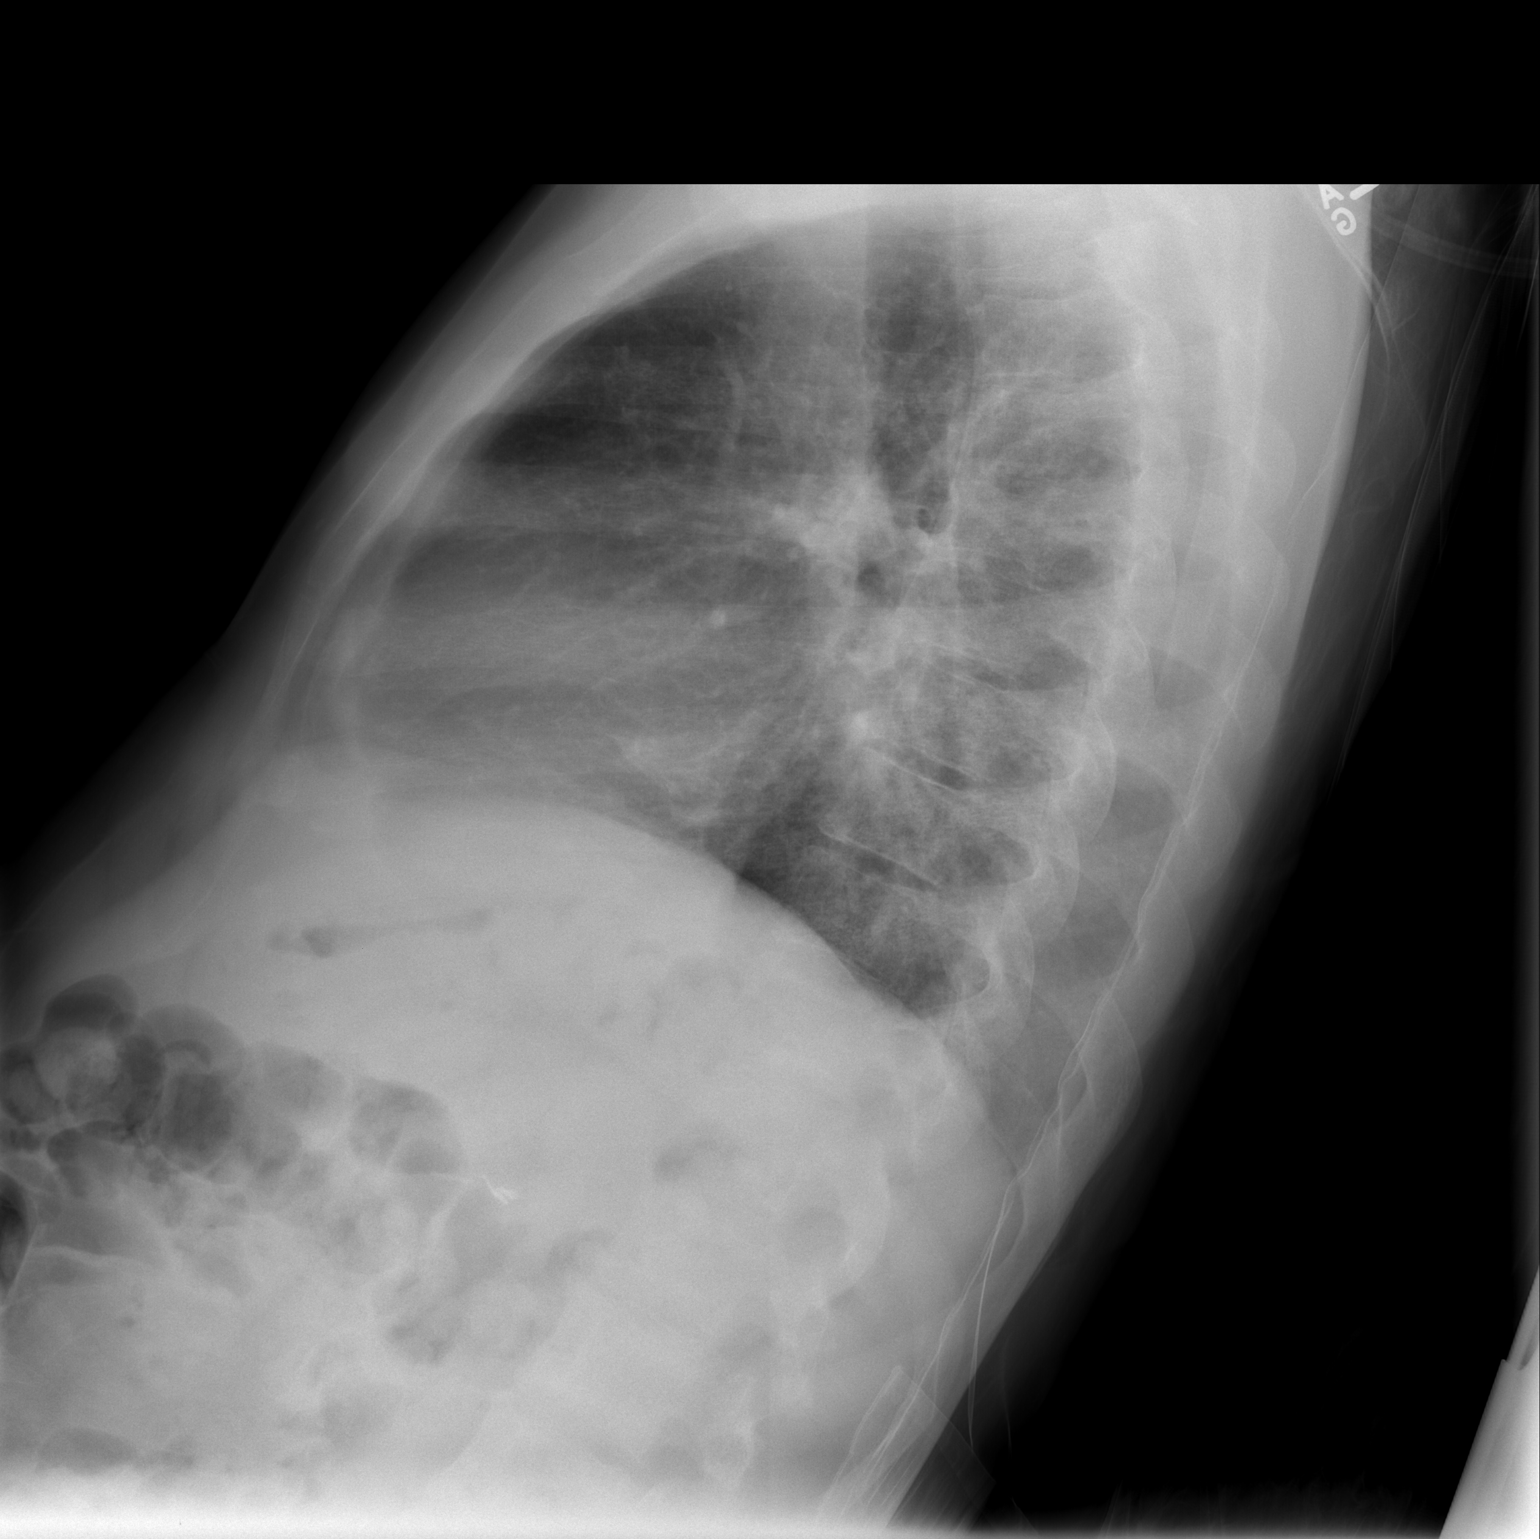

[view not recorded]
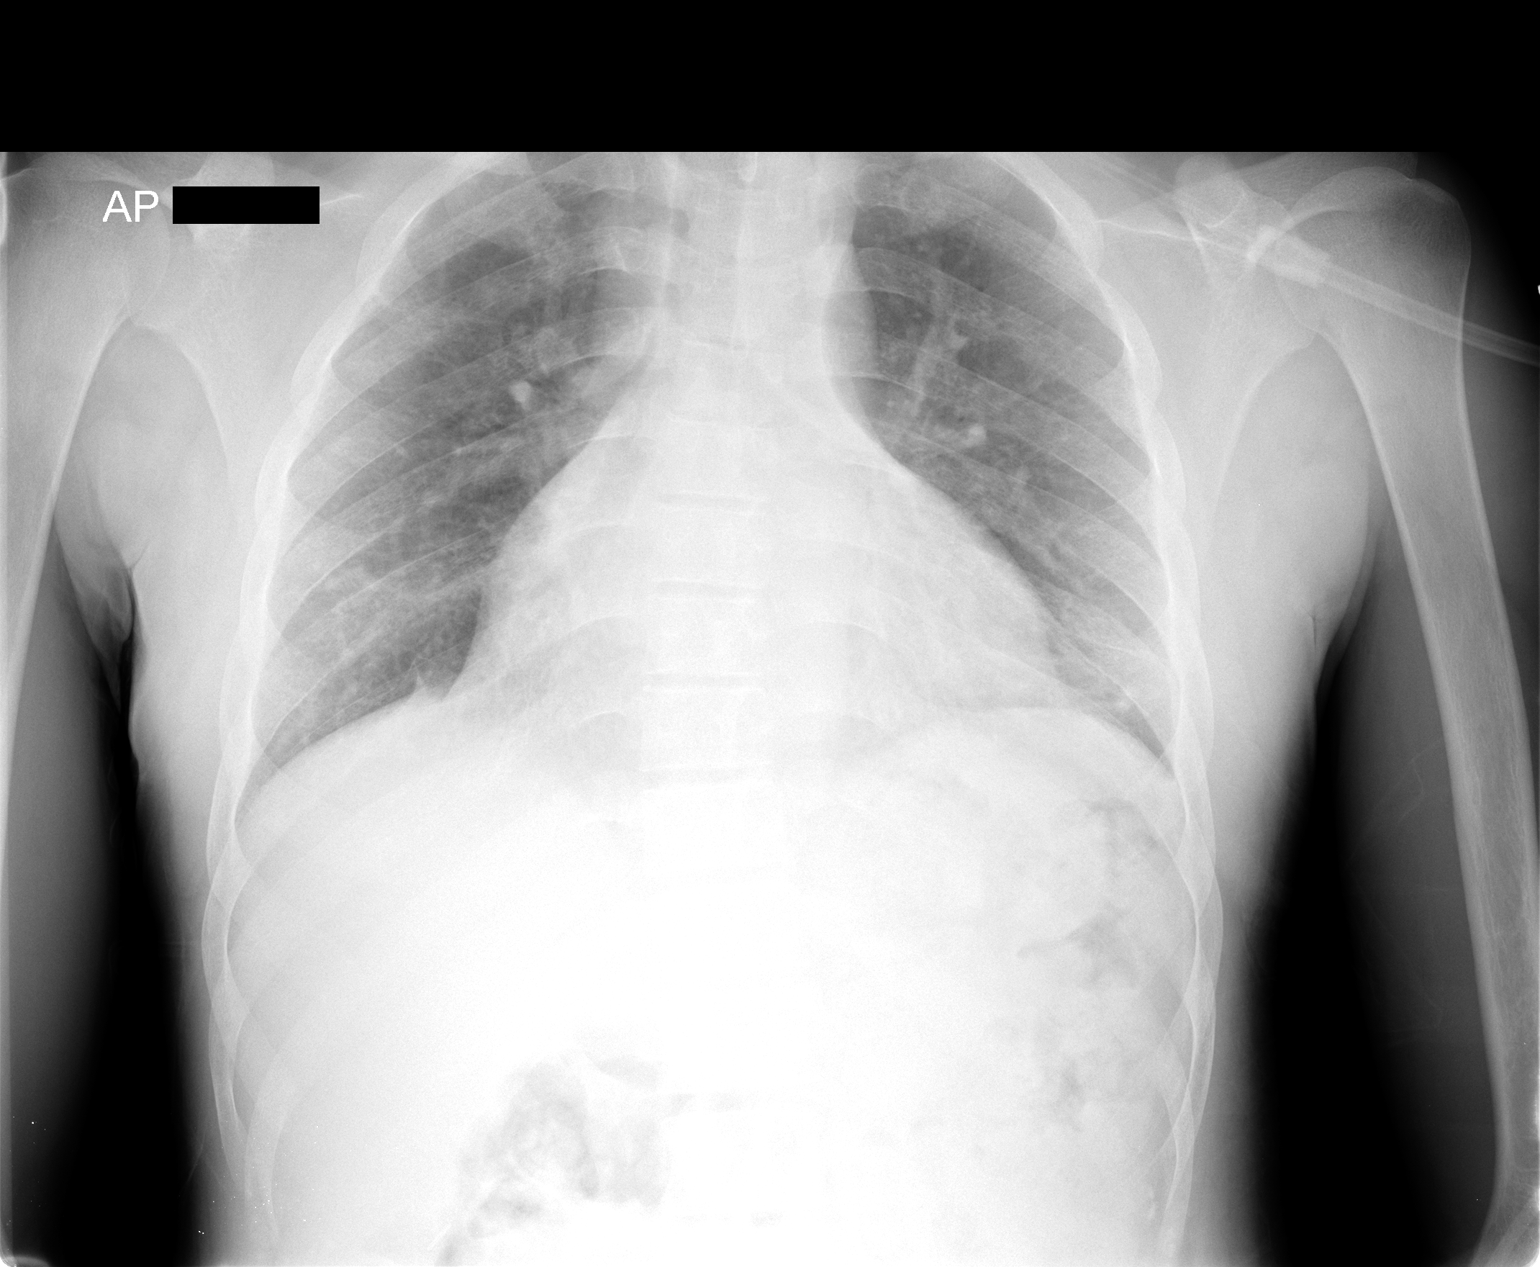

[2 of 2 positions shown; findings below may reference images not displayed]

FINDINGS: Mild cardiac enlargement is stable.  Chronic interstitial
coarsening is noted.  Minimal bibasilar atelectasis is evident.  No
focal airspace disease is present.
IMPRESSION: 1.  Stable cardiomegaly and chronic interstitial change.
2.  Slight decrease in lung volumes with mild bibasilar
atelectasis.
3.  No focal airspace disease.

## 2011-12-13 IMAGING — CR DG CHEST 1V PORT
1 series · 1 of 1 positions shown · non-contrast
Comparison: Chest radiograph performed 07/07/2010

CLINICAL DATA: Fever and abdominal pain; sickle cell crisis.

PORTABLE CHEST - 1 VIEW

[view not recorded]
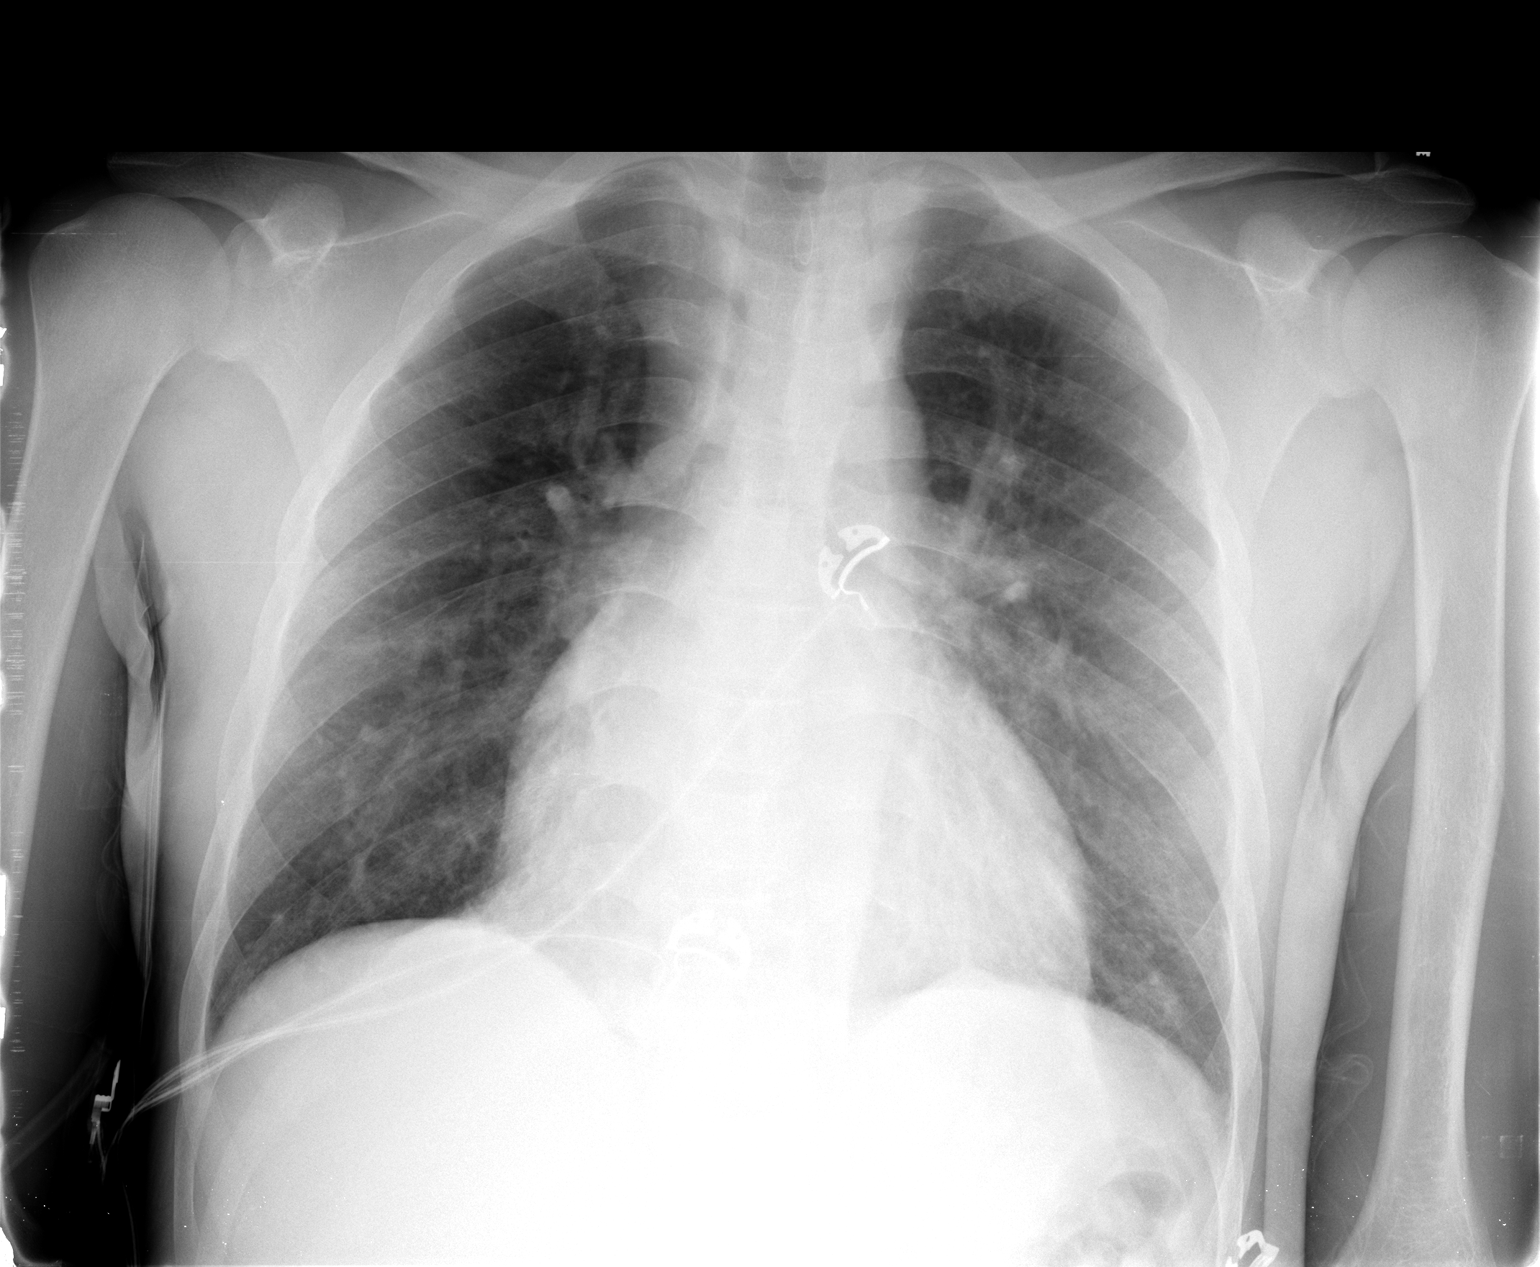

[1 of 1 positions shown; findings below may reference images not displayed]

FINDINGS: The lungs are well-aerated.  Mild vascular congestion is
noted; minimal left basilar and right midlung opacity likely
reflects atelectasis.  There is no evidence of pleural effusion or
pneumothorax.

The cardiomediastinal silhouette is borderline normal in size.  No
acute osseous abnormalities are seen.
IMPRESSION: Mild vascular congestion and minimal bilateral atelectasis.

## 2012-02-01 ENCOUNTER — Encounter: Payer: Self-pay | Admitting: Family Medicine

## 2012-02-01 ENCOUNTER — Other Ambulatory Visit (HOSPITAL_COMMUNITY)
Admission: RE | Admit: 2012-02-01 | Discharge: 2012-02-01 | Disposition: A | Discharge status: Home / Self Care | Payer: Medicare Other | Source: Ambulatory Visit | Attending: Family Medicine | Admitting: Family Medicine

## 2012-02-01 ENCOUNTER — Ambulatory Visit (INDEPENDENT_AMBULATORY_CARE_PROVIDER_SITE_OTHER): Payer: Medicare Other | Admitting: Family Medicine

## 2012-02-01 VITALS — BP 120/67 | HR 125 | Ht 64.0 in | Wt 128.0 lb

## 2012-02-01 DIAGNOSIS — Z20828 Contact with and (suspected) exposure to other viral communicable diseases: Secondary | ICD-10-CM

## 2012-02-01 DIAGNOSIS — D5700 Hb-SS disease with crisis, unspecified: Secondary | ICD-10-CM

## 2012-02-01 DIAGNOSIS — Z7251 High risk heterosexual behavior: Secondary | ICD-10-CM

## 2012-02-01 DIAGNOSIS — Z113 Encounter for screening for infections with a predominantly sexual mode of transmission: Secondary | ICD-10-CM | POA: Insufficient documentation

## 2012-02-01 DIAGNOSIS — D57819 Other sickle-cell disorders with crisis, unspecified: Secondary | ICD-10-CM

## 2012-02-01 MED ORDER — KETOROLAC TROMETHAMINE 60 MG/2ML IM SOLN
60.0000 mg | Freq: Once | INTRAMUSCULAR | Status: AC
  Administered 2012-02-01: 60 mg via INTRAMUSCULAR

## 2012-02-01 NOTE — Patient Instructions (Addendum)
Sickle Cell Pain Crisis Sickle cell anemia requires regular medical attention by your healthcare provider and awareness about when to seek medical care. Pain is a common problem in children with sickle cell disease. This usually starts at less than 29 year of age. Pain can occur nearly anywhere in the body but most commonly occurs in the extremities, back, chest, or belly (abdomen). Pain episodes can start suddenly or may follow an illness. These attacks can appear as decreased activity, loss of appetite, change in behavior, or simply complaints of pain. DIAGNOSIS   Specialized blood and gene testing can help make this diagnosis early in the disease. Blood tests may then be done to watch blood levels.  Specialized brain scans are done when there are problems in the brain during a crisis.  Lung testing may be done later in the disease. HOME CARE INSTRUCTIONS   Maintain good hydration. Increase you or your child's fluid intake in hot weather and during exercise.  Avoid smoking. Smoking lowers the oxygen in the blood and can cause the production of sickle-shaped cells (sickling).  Control pain. Only take over-the-counter or prescription medicines for pain, discomfort, or fever as directed by your caregiver. Do not give aspirin to children because of the association with Reye's syndrome.  Keep regular health care checks to keep a proper red blood cell (hemoglobin) level. A moderate anemia level protects against sickling crises.  You and your child should receive all the same immunizations and care as the people around you.  Mothers should breastfeed their babies if possible. Use formulas with iron added if breastfeeding is not possible. Additional iron should not be given unless there is a lack of it. People with sickle cell disease (SCD) build up iron faster than normal. Give folic acid and additional vitamins as directed.  If you or your child has been prescribed antibiotics or other medications  to prevent problems, take them as directed.  Summer camps are available for children with SCD. They may help young people deal with their disease. The camps introduce them to other children with the same problem.  Young people with SCD may become frustrated or angry at their disease. This can cause rebellion and refusal to follow medical care. Help groups or counseling may help with these problems.  Wear a medical alert bracelet. When traveling, keep medical information, caregiver's names, and the medications you or your child takes with you at all times. SEEK IMMEDIATE MEDICAL CARE IF:   You or your child develops dizziness or fainting, numbness in or difficulty with movement of arms and legs, difficulty with speech, or is acting abnormally. This could be early signs of a stroke. Immediate treatment is necessary.  You or your child has an oral temperature above 102 F (38.9 C), not controlled by medicine.  You or your child has other signs of infection (chills, lethargy, irritability, poor eating, vomiting). The younger the child, the more you should be concerned.  With fevers, do not give medicine to lower the fever right away. This could cover up a problem that is developing. Notify your caregiver.  You or your child develops pain that is not helped with medicine.  You or your child develops shortness of breath or is coughing up pus-like or bloody sputum.  You or your child develops any problems that are new and are causing you to worry.  You or your child develops a persistent, often uncomfortable and painful penile erection. This is called priapism. Always check young boys for  this. It is often embarrassing for them and they may not bring it to your attention. This is a medical emergency and needs immediate treatment. If this is not treated it will lead to impotence.  You or your child develops a new onset of abdominal pain, especially on the left side near the stomach area.  You or  your child has any questions or has problems that are not getting better. Return immediately if you feel your child is getting worse, even if your child was seen only a short while ago. Document Released: 11/09/2004 Document Revised: 04/24/2011 Document Reviewed: 03/31/2009 [facility] Patient Information 2013 [place].

## 2012-02-01 NOTE — Progress Notes (Signed)
  Subjective:    Patient ID: [name] [name]., male    DOB: [birth year], 31 y.o.   MRN: [id number]  HPI 31 y.o. male with sickle cell disease with increased pain in legs today. Tends to get worse with cold weather. Pain is in bones of lower legs. No numbness/tingling/weakness. No joint swelling or pain. No muscle cramps. No shortness of breath. Oral pain medications not working as well with weather change. Pain is 6/10.  Does not want to end up in hospital with pain crisis. Usually comes in for toradol shot when his pain gets worse, sometimes dilaudid. With weather often the oral pain medications don't work.    Had some tingling with urination recently after condom broke. No penile discharge, no visible sores. Has had several male sexual partners but usually uses condoms. No history of STDs. Would like to be tested for "everything."  Review of Systems  Constitutional: Negative for fever, chills and diaphoresis.  HENT: Negative for neck pain and neck stiffness.   Respiratory: Negative for cough, chest tightness and shortness of breath.   Cardiovascular: Negative for chest pain.  Gastrointestinal: Negative for nausea and vomiting.  Genitourinary: Positive for dysuria. Negative for urgency, frequency, flank pain, discharge, penile swelling, difficulty urinating, genital sores and penile pain.  Musculoskeletal: Negative for myalgias, back pain, joint swelling and arthralgias.  Skin: Negative for rash.       Objective:   Physical Exam  Constitutional: He is oriented to person, place, and time. He appears well-developed and well-nourished. No distress.  HENT:  Head: Normocephalic and atraumatic.  Eyes: Conjunctivae normal and EOM are normal. No scleral icterus.  Neck: Normal range of motion. Neck supple.  Cardiovascular: Normal rate, regular rhythm and normal heart sounds.   Pulmonary/Chest: Effort normal and breath sounds normal. No respiratory distress. He has no wheezes.  Abdominal:  Soft. There is no tenderness.  Musculoskeletal: Normal range of motion. He exhibits tenderness. He exhibits no edema.  Neurological: He is alert and oriented to person, place, and time.  Skin: Skin is warm and dry.  Psychiatric: He has a normal mood and affect.       Assessment & Plan:  31 y.o. male with sickle cell disease and increased leg pain x 2 days. - Toradol injection given in office - no signs of severe pain crisis - f/u as needed  - testing for GC/Chlamydia, HIV, RPR, Hep B. Discussed safe sexual practices, preventing transmission of STDs.  Martha Clan, MD

## 2012-02-02 LAB — HIV ANTIBODY (ROUTINE TESTING W REFLEX): HIV: NONREACTIVE

## 2012-02-02 LAB — HEPATITIS B SURFACE ANTIGEN: Hepatitis B Surface Ag: NEGATIVE

## 2012-03-01 ENCOUNTER — Encounter: Payer: Self-pay | Admitting: Family Medicine

## 2012-03-01 ENCOUNTER — Ambulatory Visit (INDEPENDENT_AMBULATORY_CARE_PROVIDER_SITE_OTHER): Payer: Medicare Other | Admitting: Family Medicine

## 2012-03-01 VITALS — HR 123 | Temp 98.8°F | Ht 64.0 in | Wt 124.0 lb

## 2012-03-01 DIAGNOSIS — D571 Sickle-cell disease without crisis: Secondary | ICD-10-CM

## 2012-03-01 DIAGNOSIS — D5700 Hb-SS disease with crisis, unspecified: Secondary | ICD-10-CM

## 2012-03-01 MED ORDER — OXYCODONE HCL 40 MG PO TB12: 40.0000 mg | ORAL_TABLET | Freq: Two times a day (BID) | ORAL | Status: DC

## 2012-03-01 MED ORDER — OXYCODONE-ACETAMINOPHEN 10-325 MG PO TABS: 1.0000 | ORAL_TABLET | Freq: Four times a day (QID) | ORAL | Status: DC | PRN

## 2012-03-01 MED ORDER — OXYCODONE-ACETAMINOPHEN 10-325 MG PO TABS: 1.0000 | ORAL_TABLET | Freq: Three times a day (TID) | ORAL | Status: DC | PRN

## 2012-03-01 NOTE — Patient Instructions (Addendum)
Follow-up in 3 months for medication refills

## 2012-03-01 NOTE — Progress Notes (Signed)
  Subjective:    Patient ID: [name] [name]., male    DOB: [birth year], 32 y.o.   MRN: [id number]  HPI Follow-up for pain medications for management of sickle cell disease He denies symptoms of crisis at this time. He feels this winter has been going well. He is taking his medications and is working to stay hydrated He is not working but caring for his mom at home who is undergoing treatment for breast cancer  Review of Systems Denies difficulty breathing  Allergies, medication, past medical history reviewed.      Objective:   Physical Exam Gen: NAD; well-appearing, -nourished PSYCH: engaged and normally conversant, appropriate to questions, alert and oriented CV: RRR, normal S1/S2 PULM: NI WOB; CTAB without w/r/r ABD: soft, NT, ND EXT: no edema SKIN: warm, dry, no rash     Assessment & Plan:

## 2012-03-01 NOTE — Assessment & Plan Note (Signed)
Stable at this time. Narcotics refilled. Follow-up in 3 months or sooner if needed.

## 2012-03-18 ENCOUNTER — Telehealth: Payer: Self-pay | Admitting: Family Medicine

## 2012-03-18 NOTE — Telephone Encounter (Signed)
Patient brought unsigned Rx for oxycodone on prescription paper from Dr Verdie Drown  I hand wrote it and signed - Oxycodone 40 mg q 12 hrs #60 NR

## 2012-04-08 ENCOUNTER — Other Ambulatory Visit: Payer: Self-pay | Admitting: Family Medicine

## 2012-04-08 MED ORDER — NORTRIPTYLINE HCL 25 MG PO CAPS: 25.0000 mg | ORAL_CAPSULE | Freq: Every day | ORAL | Status: DC

## 2012-04-08 NOTE — Telephone Encounter (Signed)
Since he was here requesting medication refills, we spoke regarding his chronic narcotic use. We discussed how narcotics are for pain crises only, not for chronic pain; he takes because he "is not like a normal person" and "is in pain all the time" and takes narcotics "so he doesn't have a pain crisis". We discussed how pain crises usually do not happen because of chronic pain.  I plan on tapering his narcotics.  If he is dissatisfied he may go back to [facility] where he was getting narcotics or I can refer him to Dr. Marlou Sa or the pain clinic>>>he declined at this time.  I offered to try other medications to help control his pain, but he did not seem interested.   He was very upset, cursed a few times, and abruptly left. He declined speaking with my attendings regarding this.

## 2012-04-11 IMAGING — CR DG CHEST 1V PORT
1 series · 1 of 1 positions shown · non-contrast
Comparison: the previous day's study

CLINICAL DATA: Tachycardia

PORTABLE CHEST - 1 VIEW

[AP]
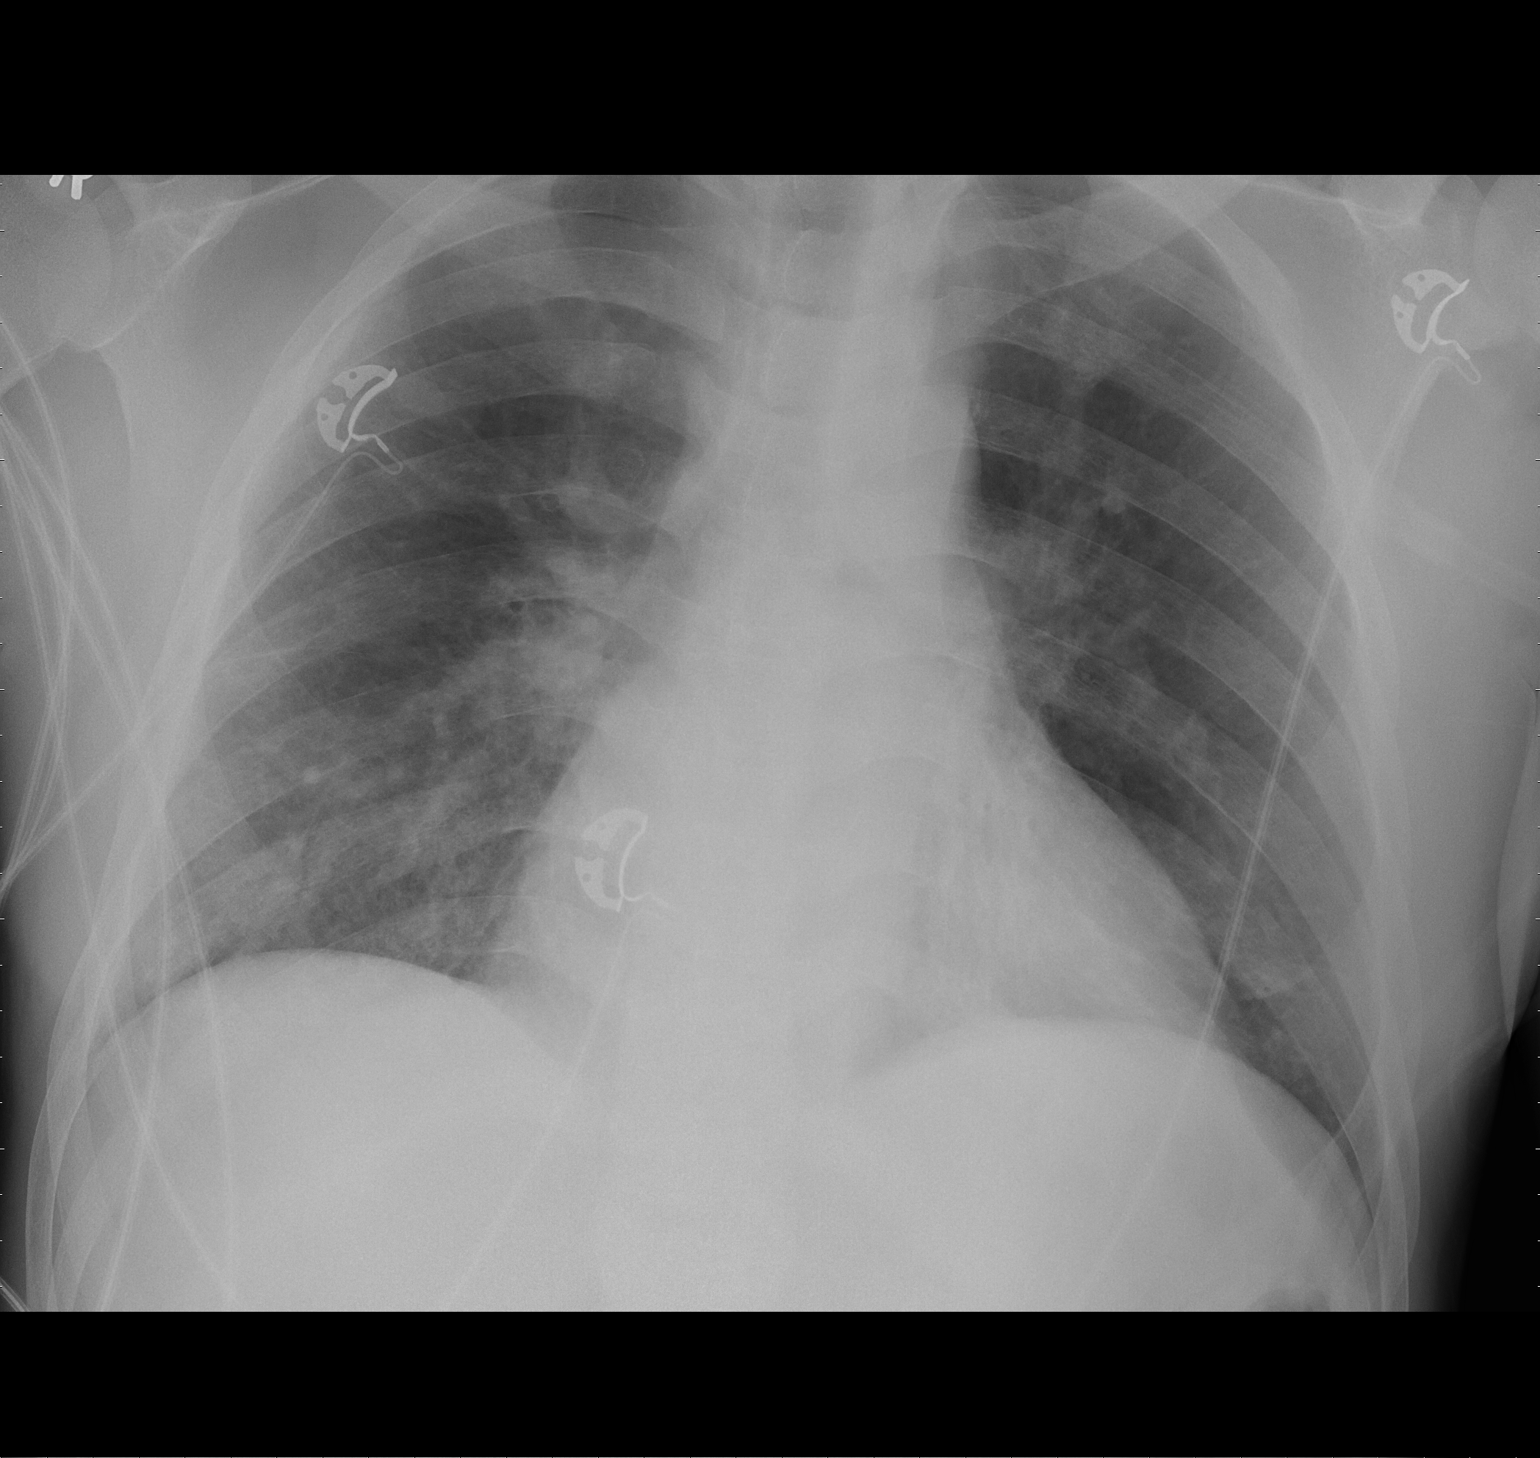

[1 of 1 positions shown; findings below may reference images not displayed]

FINDINGS: Borderline cardiomegaly.  New bilateral perihilar and
right lower lobe mild alveolar opacities.  No effusion.  Regional
bones unremarkable.
IMPRESSION: 1.  New perihilar and right lower lobe alveolar infiltrates or
asymmetric edema.
2.  Borderline cardiomegaly.

## 2012-04-23 ENCOUNTER — Telehealth: Payer: Self-pay | Admitting: Family Medicine

## 2012-04-23 NOTE — Telephone Encounter (Signed)
I had seen patient in clinic when he stopped by to pick up Rx for narcotics a few weeks ago (around middle of February). He was very upset. I had documented this in EPIC but am not sure where I had documented it. I gave him an Rx for medications but told him I would start to taper.  He left abruptly and did not want alternative management.  LVM letting him know again I could discuss alternative treatments with him in clinic to help manage chronic pain or refer him to pain clinic or Dr. Marlou Sa for another opinion/allowing them to manage his pain.

## 2012-05-20 ENCOUNTER — Encounter: Payer: Self-pay | Admitting: Family Medicine

## 2012-05-20 ENCOUNTER — Ambulatory Visit (INDEPENDENT_AMBULATORY_CARE_PROVIDER_SITE_OTHER): Payer: Medicare Other | Admitting: Family Medicine

## 2012-05-20 VITALS — BP 97/56 | HR 112 | Temp 98.4°F | Ht 64.0 in | Wt 125.0 lb

## 2012-05-20 DIAGNOSIS — D571 Sickle-cell disease without crisis: Secondary | ICD-10-CM

## 2012-05-20 DIAGNOSIS — N182 Chronic kidney disease, stage 2 (mild): Secondary | ICD-10-CM

## 2012-05-20 MED ORDER — OXYCODONE HCL 40 MG PO TB12: 40.0000 mg | ORAL_TABLET | Freq: Two times a day (BID) | ORAL | Status: DC

## 2012-05-20 MED ORDER — OXYCODONE-ACETAMINOPHEN 10-325 MG PO TABS: 1.0000 | ORAL_TABLET | Freq: Four times a day (QID) | ORAL | Status: DC | PRN

## 2012-05-20 NOTE — Patient Instructions (Signed)
If [facility] needs information from [place], please have them call [phone] or fax .  Please have them send [place] a copy of their office visit note to me.   If you need more pain medication past May, please come in 1 week before your run out of medications in case [facility] cannot prescribe by that time.

## 2012-05-20 NOTE — Assessment & Plan Note (Signed)
He will be transitioning care back to [facility] since I do not feel appropriate to prescribe routine/daily narcotics in this stable sickle cell patient. See HPI regarding details.  -Rx for narcotics through May given -Will need appointment if [facility] not able to prescribe narcotics by the end of May; I will need an office visit note from [place] if he requires more narcotics from our clinic at that time -Follow-up as needed -An appropriate dose of hydroxyurea is at least about 900 mg; he does not want to increase hydroxyurea dose at this time since he has been stable at 500 mg daily

## 2012-05-20 NOTE — Progress Notes (Signed)
  Subjective:    Patient ID: [name] [name]., male    DOB: Aug 17, 1980, 32 y.o.   MRN: [id number]  HPI # Follow-up of sickle cell He will now be followed at the [facility] clinic. His first appointment is next week. He thought I wanted him to come in; actually I just called him and wanted to know if I could help with any transitions since informing him I do not feel comfortable prescribing such a high dose of narcotics in stable sickle cell. He has some mild pain today due to the rainy weather. He took pain medication and he feels fine. He denies difficulty breathing.  He thinks it may take 1-2 months before [facility] will prescribe his medications since he needs to be get reestablished. He was last seen 3 years ago and requests Rx for pain medications to help him through transition.   Review of Systems Per HPI Denies significant tenderness in extremities or back   Allergies, medication, past medical history reviewed.  Smoking status noted.     Objective:   Physical Exam GEN: NAD PULM: NI WOB; CTAB without w/r/r CV: RRR, no murmurs    Assessment & Plan:

## 2012-06-24 IMAGING — CR DG CHEST 2V
2 series · 2 of 2 positions shown · non-contrast
Comparison: Portable chest x-ray of 11/05/2010

CLINICAL DATA: Cough, upper respiratory infection symptoms,
shortness of breath, smoking history

CHEST - 2 VIEW

[w chest pa]
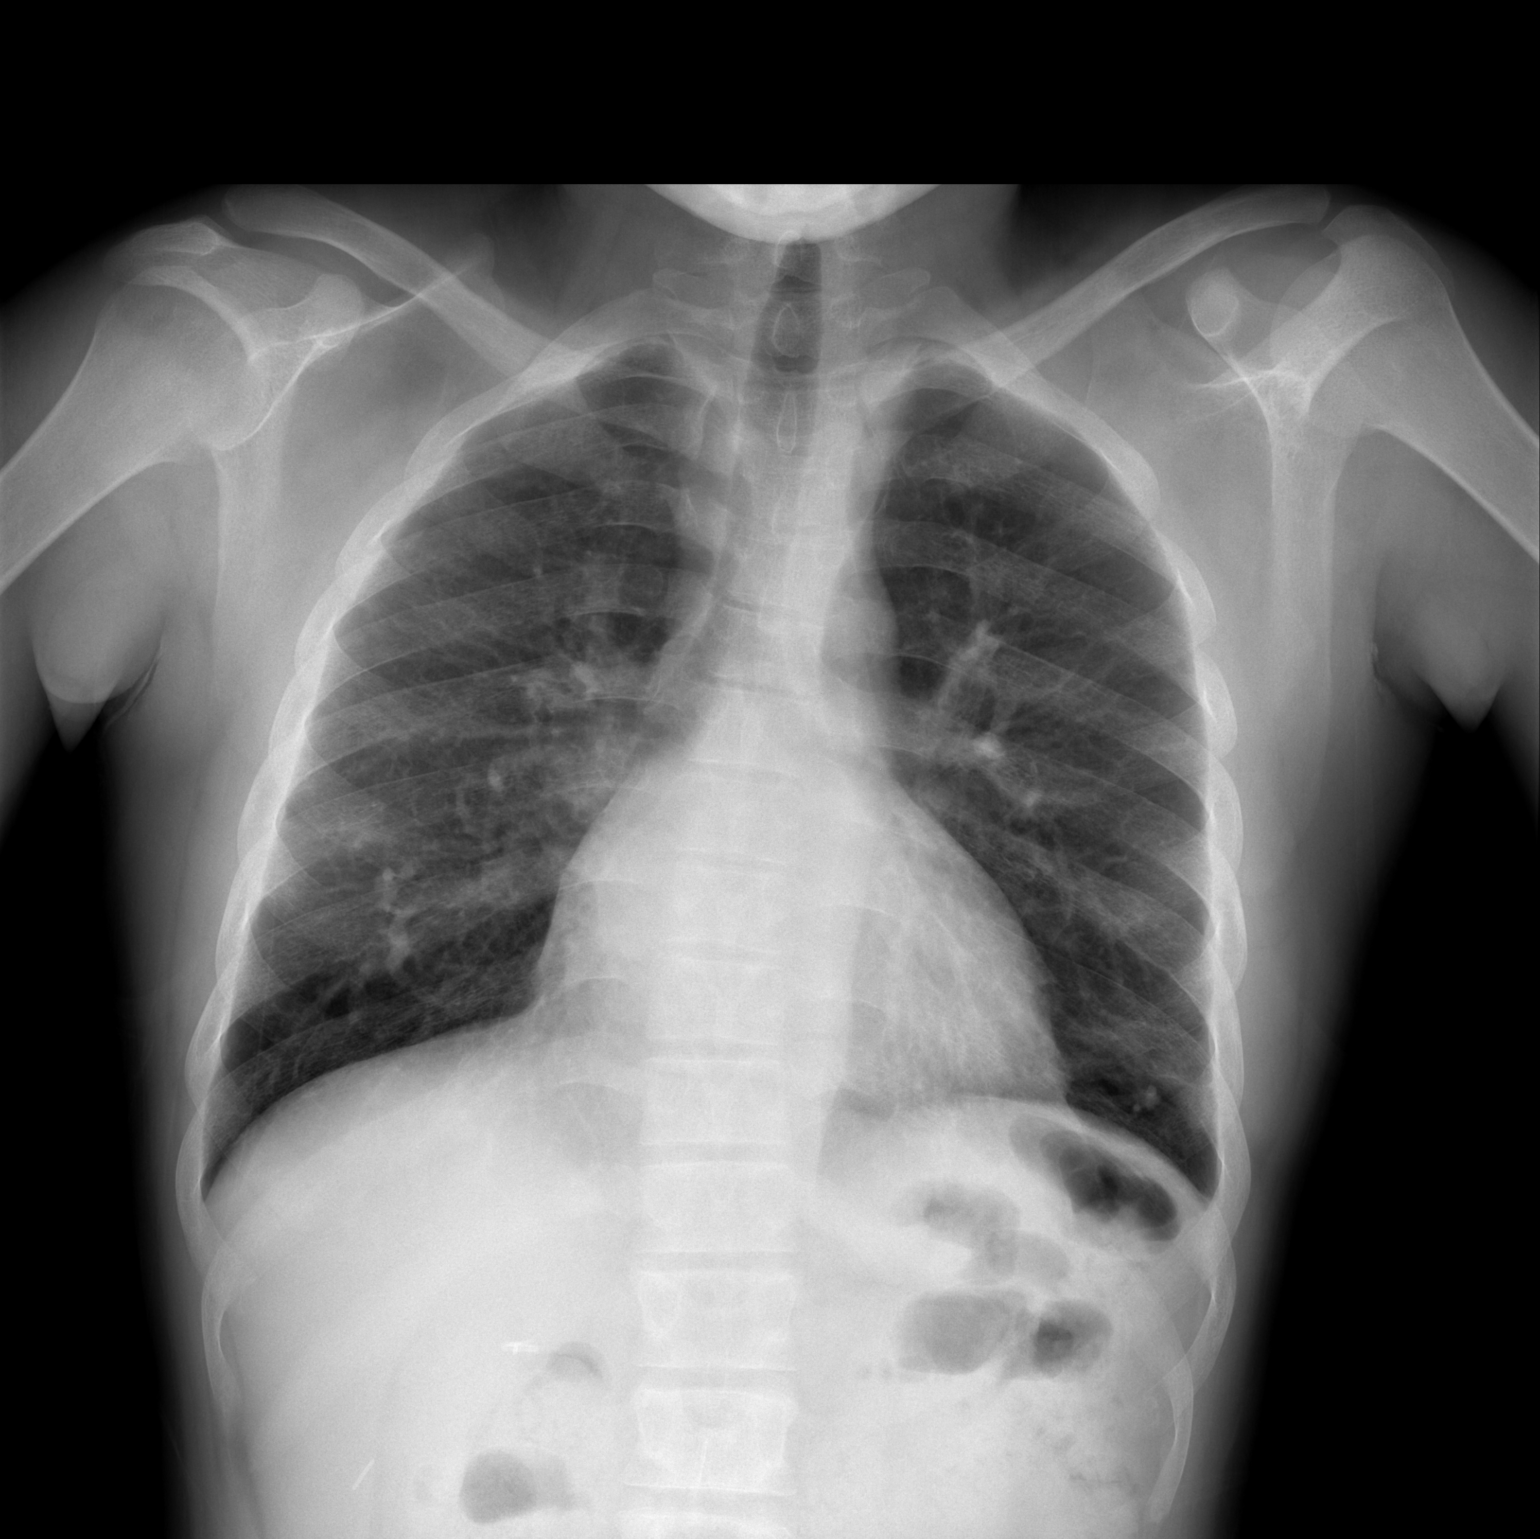

[w chest lat]
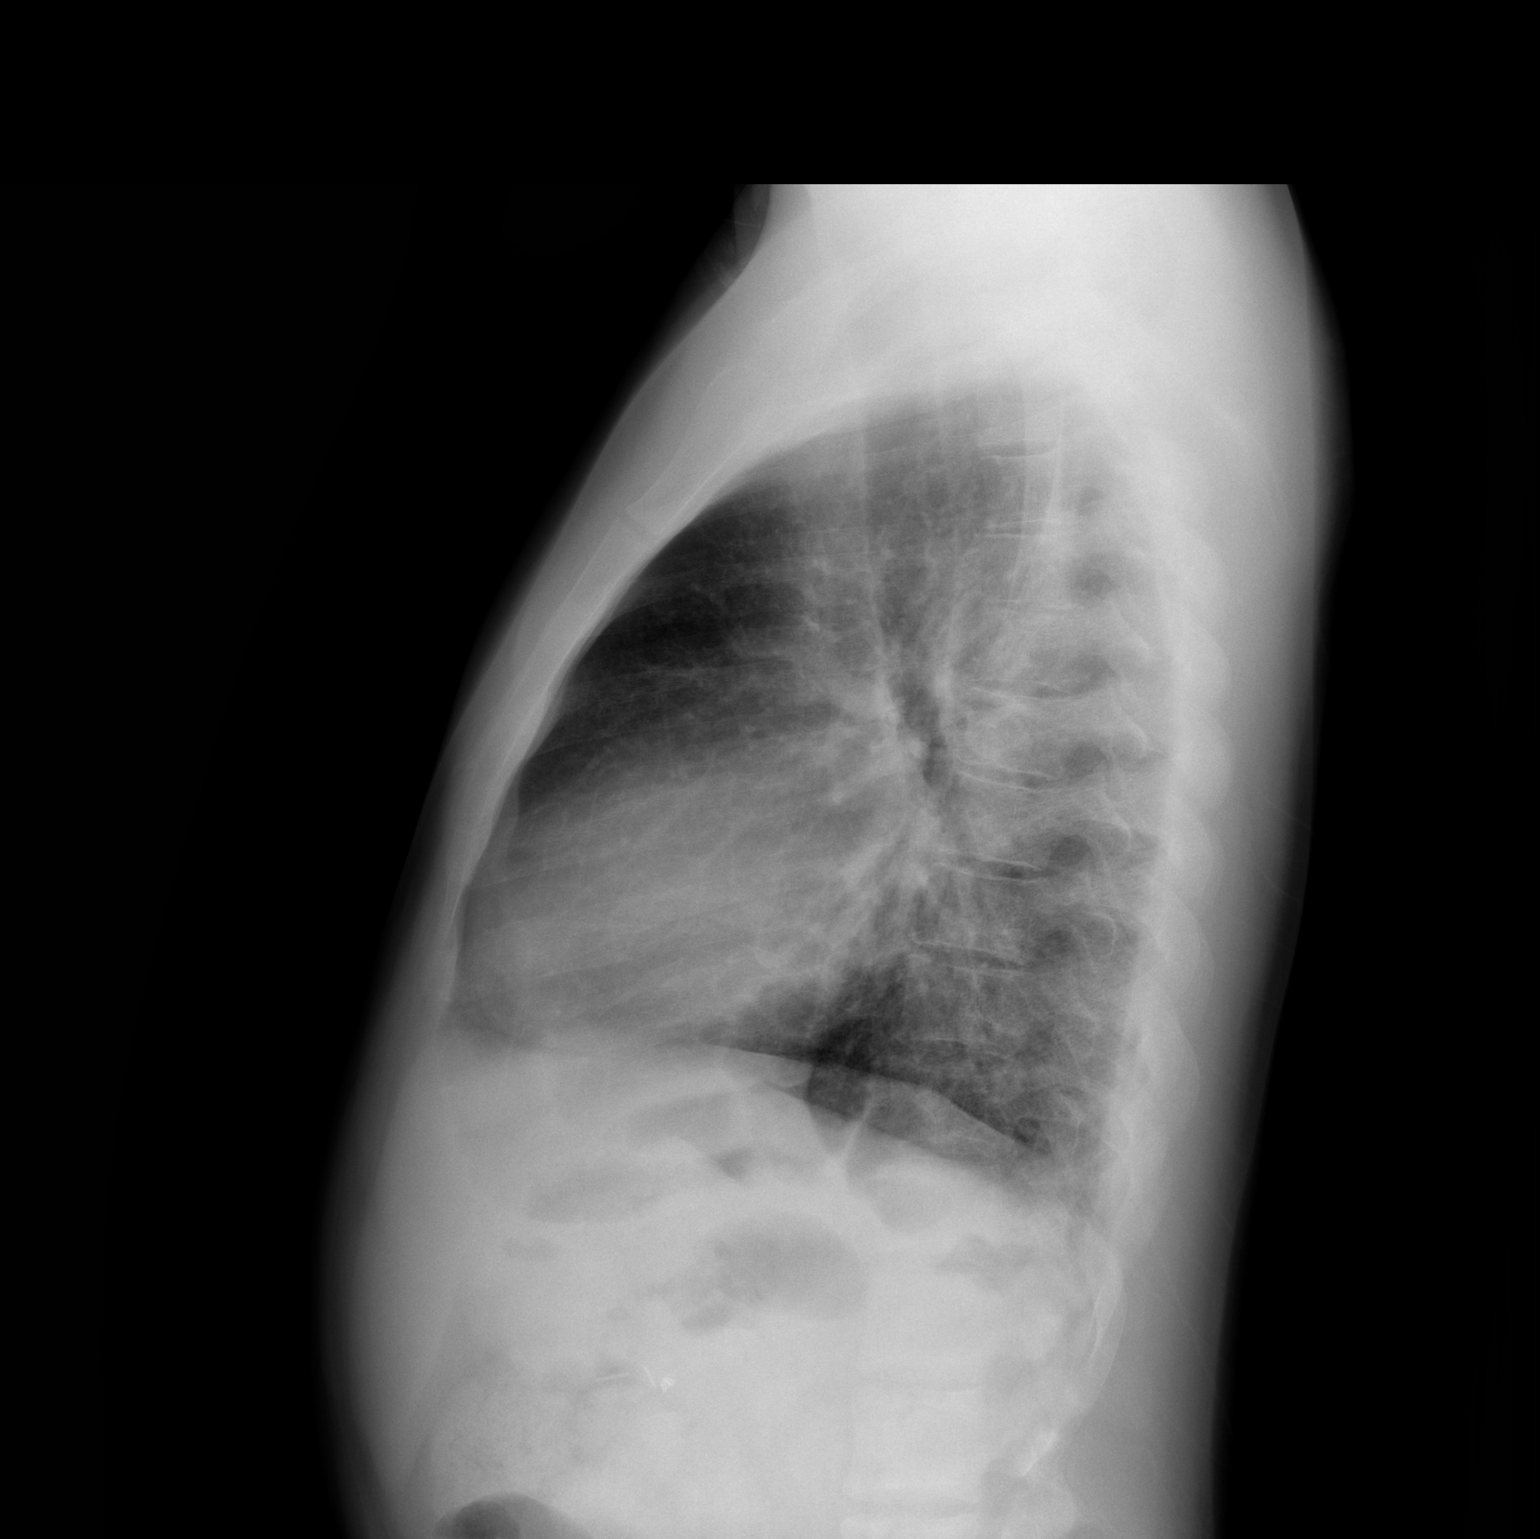

[2 of 2 positions shown; findings below may reference images not displayed]

FINDINGS: There is no evidence of pneumonia.  Mild peribronchial
thickening is present which may indicate bronchitis.  Mediastinal
contours are stable.  The heart is within normal limits in size.
No bony abnormality is seen.
IMPRESSION: No pneumonia.  Question bronchitis.

## 2012-06-26 ENCOUNTER — Ambulatory Visit (INDEPENDENT_AMBULATORY_CARE_PROVIDER_SITE_OTHER): Payer: Medicare Other | Admitting: Family Medicine

## 2012-06-26 ENCOUNTER — Encounter: Payer: Self-pay | Admitting: Family Medicine

## 2012-06-26 VITALS — BP 102/63 | HR 109 | Temp 98.8°F | Ht 64.0 in | Wt 119.8 lb

## 2012-06-26 DIAGNOSIS — D571 Sickle-cell disease without crisis: Secondary | ICD-10-CM

## 2012-06-26 MED ORDER — OXYCODONE-ACETAMINOPHEN 10-325 MG PO TABS: 1.0000 | ORAL_TABLET | Freq: Three times a day (TID) | ORAL | Status: DC | PRN

## 2012-06-26 MED ORDER — OXYCODONE HCL ER 20 MG PO T12A: 40.0000 mg | EXTENDED_RELEASE_TABLET | Freq: Two times a day (BID) | ORAL | Status: DC

## 2012-06-26 NOTE — Patient Instructions (Addendum)
Follow-up as needed In 6 months for your next physical

## 2012-06-26 NOTE — Addendum Note (Signed)
Addended by: Verdie Drown, Levada Dy J on: 06/26/2012 11:58 AM   Modules accepted: Orders

## 2012-06-26 NOTE — Progress Notes (Signed)
  Subjective:    Patient ID: [name] [name]., male    DOB: 25-Oct-1980, 32 y.o.   MRN: [id number]  HPI # Sickle cell follow-up; on chronic narcotics He says Oxycontin 40 bid not working for him   He took his mother's 20 mg and says that it works better and he thinks this is because of the gel in the 40 mg doses (per pharmacy)  He is taking more percocet   He was not able to go to [facility] and has his first appointment on the 28th and he thinks it will be a while before   Review of Systems Denies fevers, chills, significant sickle cell pain (he has chronic mild lower back and leg pain), constipation, dyspnea   Allergies, medication, past medical history reviewed.  Smoking status noted. S2 CKD Tobacco use     Objective:   Physical Exam GEN: NAD; well-nourished, -appearing PULM: NI WOB  MSK: mild lumbar area tenderness and upper leg pain bilaterally      Assessment & Plan:

## 2012-06-26 NOTE — Assessment & Plan Note (Addendum)
He could not follow-up with [facility] due to lack of transportation, financila reasons. His first appointment is 05/28. He says that 40 mg bid Oxycotin does not work as well as 20 mg tablets, but still at same dose. He has been requiring more Percocet.  Again we talked about how I disagree his chronic use of these medications.  However, I understand how it may be difficult to be seen as far away as [facility] where he was seen before (reason for which he declined being seen at sickle cell clinic here) so I will refill for another month. He brought his old Oxycontin medication to me today.  He was told that narcotics will no longer be prescribed after this time, definitely will not be continued by his next provider after I graduate next month. He expresses understanding.  Also check UDS today to make sure positive for narcotics>>>He reported to taking Percocet the night prior. We also discussed positive cocaine results, which he adamantly denied. I reiterated that this practice will no longer be giving chronic narcotics for sickle. He hung up the phone on me.

## 2012-06-27 LAB — DRUG SCREEN, URINE
Amphetamine Screen, Ur: NEGATIVE
Barbiturate Quant, Ur: NEGATIVE
Cocaine Metabolites: POSITIVE — AB
Marijuana Metabolite: NEGATIVE
Opiates: NEGATIVE
Phencyclidine (PCP): NEGATIVE

## 2012-07-01 ENCOUNTER — Other Ambulatory Visit: Payer: Self-pay | Admitting: *Deleted

## 2012-07-01 MED ORDER — ENALAPRIL MALEATE 5 MG PO TABS: 5.0000 mg | ORAL_TABLET | Freq: Every day | ORAL | Status: DC

## 2012-07-01 NOTE — Telephone Encounter (Signed)
Requested Prescriptions   Pending Prescriptions Disp Refills  . enalapril (VASOTEC) 5 MG tablet 90 tablet 1    Sig: Take 1 tablet (5 mg total) by mouth daily.

## 2012-07-03 ENCOUNTER — Encounter: Payer: Self-pay | Admitting: Family Medicine

## 2012-07-10 DIAGNOSIS — N08 Glomerular disorders in diseases classified elsewhere: Secondary | ICD-10-CM

## 2012-07-10 DIAGNOSIS — D571 Sickle-cell disease without crisis: Secondary | ICD-10-CM

## 2012-07-10 DIAGNOSIS — N62 Hypertrophy of breast: Secondary | ICD-10-CM

## 2012-07-10 HISTORY — DX: Sickle-cell disease without crisis: D57.1

## 2012-07-10 HISTORY — DX: Glomerular disorders in diseases classified elsewhere: N08

## 2012-07-10 HISTORY — DX: Hypertrophy of breast: N62

## 2012-07-19 ENCOUNTER — Encounter: Payer: Self-pay | Admitting: Family Medicine

## 2012-08-01 ENCOUNTER — Ambulatory Visit (INDEPENDENT_AMBULATORY_CARE_PROVIDER_SITE_OTHER): Payer: Medicare Other | Admitting: Family Medicine

## 2012-08-01 ENCOUNTER — Encounter: Payer: Self-pay | Admitting: Family Medicine

## 2012-08-01 VITALS — BP 103/65 | HR 94 | Temp 98.2°F | Ht 64.0 in | Wt 119.0 lb

## 2012-08-01 DIAGNOSIS — D571 Sickle-cell disease without crisis: Secondary | ICD-10-CM

## 2012-08-01 DIAGNOSIS — D5700 Hb-SS disease with crisis, unspecified: Secondary | ICD-10-CM

## 2012-08-01 MED ORDER — KETOROLAC TROMETHAMINE 30 MG/ML IJ SOLN
30.0000 mg | Freq: Once | INTRAMUSCULAR | Status: AC
  Administered 2012-08-01: 30 mg via INTRAMUSCULAR

## 2012-08-01 MED ORDER — OXYCODONE-ACETAMINOPHEN 10-325 MG PO TABS: 1.0000 | ORAL_TABLET | Freq: Two times a day (BID) | ORAL | Status: DC | PRN

## 2012-08-01 MED ORDER — OXYCODONE HCL ER 20 MG PO T12A: 40.0000 mg | EXTENDED_RELEASE_TABLET | Freq: Two times a day (BID) | ORAL | Status: DC

## 2012-08-01 NOTE — Addendum Note (Signed)
Addended by: Martin[place], Merril Nagy on: 08/01/2012 03:20 PM   Modules accepted: Orders

## 2012-08-01 NOTE — Assessment & Plan Note (Signed)
He has appointment at sickle cell pain clinic for medications July 18th. He has already been seen at [facility] but will be getting medications at pain clinic.  Will refill Oxycontin 20 bid prn #45 and Percocet 10/325 bid prn # 60 to last him until that visit.  He will only get further refills if he brings a written note from pain clinic stating why he did not receive pain medications at that visit.

## 2012-08-01 NOTE — Progress Notes (Signed)
  Subjective:    Patient ID: [name] [name]., male    DOB: 1980/02/14, 33 y.o.   MRN: [id number]  HPI # Sickle cell follow-up He is now being followed at [facility] He will be followed at sickle cell pain clinic in [place] but his initial visit is July 18, and he needs refills on his pain medications until then   Review of Systems Denies SOB, chest pain  Allergies, medication, past medical history reviewed.  Smoking status noted.     Objective:   Physical Exam GEN: NAD PULM: NI WOB PSYCH: appropriate to questions     Assessment & Plan:

## 2012-08-01 NOTE — Addendum Note (Signed)
Addended by: Valerie Roys on: 08/01/2012 05:15 PM   Modules accepted: Orders

## 2012-08-01 NOTE — Patient Instructions (Addendum)
Follow-up in 6 weeks for a lab visit

## 2012-08-01 NOTE — Addendum Note (Signed)
Addended by: Martinique, Scottlyn Mchaney on: 08/01/2012 04:14 PM   Modules accepted: Orders

## 2012-08-02 ENCOUNTER — Encounter: Payer: Self-pay | Admitting: Family Medicine

## 2012-08-02 LAB — RETICULOCYTES
ABS Retic: 94.3 10*3/uL (ref 19.0–186.0)
Retic Ct Pct: 5.3 % — ABNORMAL HIGH (ref 0.4–2.3)

## 2012-08-02 LAB — CBC
HCT: 20.1 % — ABNORMAL LOW (ref 39.0–52.0)
Hemoglobin: 7.3 g/dL — ABNORMAL LOW (ref 13.0–17.0)
MCHC: 36.3 g/dL — ABNORMAL HIGH (ref 30.0–36.0)
MCV: 112.9 fL — ABNORMAL HIGH (ref 78.0–100.0)
RDW: 17.5 % — ABNORMAL HIGH (ref 11.5–15.5)

## 2012-08-02 LAB — BASIC METABOLIC PANEL
Calcium: 8.8 mg/dL (ref 8.4–10.5)
Potassium: 5.9 mEq/L — ABNORMAL HIGH (ref 3.5–5.3)
Sodium: 129 mEq/L — ABNORMAL LOW (ref 135–145)

## 2012-08-02 LAB — LACTATE DEHYDROGENASE: LDH: 425 U/L — ABNORMAL HIGH (ref 94–250)

## 2012-08-23 ENCOUNTER — Encounter: Payer: Self-pay | Admitting: Family Medicine

## 2012-08-23 ENCOUNTER — Ambulatory Visit (INDEPENDENT_AMBULATORY_CARE_PROVIDER_SITE_OTHER): Payer: Medicare Other | Admitting: Family Medicine

## 2012-08-23 VITALS — BP 111/68 | HR 111 | Temp 98.3°F | Ht 64.0 in | Wt 123.0 lb

## 2012-08-23 DIAGNOSIS — K044 Acute apical periodontitis of pulpal origin: Secondary | ICD-10-CM

## 2012-08-23 DIAGNOSIS — G43909 Migraine, unspecified, not intractable, without status migrainosus: Secondary | ICD-10-CM

## 2012-08-23 DIAGNOSIS — K047 Periapical abscess without sinus: Secondary | ICD-10-CM

## 2012-08-23 MED ORDER — PENICILLIN V POTASSIUM 500 MG PO TABS: 500.0000 mg | ORAL_TABLET | Freq: Three times a day (TID) | ORAL | Status: DC

## 2012-08-23 MED ORDER — NORTRIPTYLINE HCL 25 MG PO CAPS: 25.0000 mg | ORAL_CAPSULE | Freq: Every day | ORAL | Status: DC

## 2012-08-23 NOTE — Assessment & Plan Note (Signed)
Refilled nortriptyline. Appears to help with daily headaches even when not full blown migraines. Restart and follow up Near end of month for 6 week bloodwork as well.

## 2012-08-23 NOTE — Progress Notes (Signed)
  [name] [name] Reddish, MD Phone: [phone]  Subjective:   # Headache Ran out of nortriptyline Sunday and has been having a few headaches at least 2 a day. Bilateral sides of head. Not disabling like migraines.  Slight dizziness in AM associated with it which resolves after eating breakfast/drinking fluids. Appointment coming up with neurologist in [place] this month. Lasts for about 2 hours. Pounding in nature. No nausea. Feels like previous migraines except not as bad and doesn't last as long. Slight increase in fatigue.   #Facial puffiness Feels like face has been slightly puffy as well on the left mainly. Back top tooth tarted hurting Sunday or Monday. No radiation. Constant pain worse with chewing.  Left side and top has a tooth that has been hurting. Seeing dentist next week. History of multiple caries. No fevers/chills/nausea/vomiting.   Patient denies current pain crisis or pain in other areas.   ROS--See HPI  Past Medical History-systolic murmur but with normal echo. History migraines. Mild CKD. Sickle Cell SS disease, tobacco abuse quit 2012 but has occasional cigarette (advised cessation).  Reviewed problem list.  Medications- reviewed and updated Chief complaint-noted  Objective: BP 111/68  Pulse 111  Temp(Src) 98.3 F (36.8 C) (Oral)  Ht 5\' 4"  (1.626 m)  Wt 123 lb (55.792 kg)  BMI 21.1 kg/m2 Gen: NAD, resting comfortably on table CV: RRR no murmurs rubs or gallops. HR upper 90s on my exam HEENT: no obvious facial puffiness, poor dentition with dental carie noted on upper back tooth with some associated erythema at gumline, scleral icterus noted bilaterally.  Lungs: CTAB no crackles, wheeze, rhonchi Skin: warm, dry Neuro: grossly normal, moves all extremities  Assessment/Plan:  #Infected tooth/facial puffiness Face not obviously enlarged to my view. Does have dental carie that I will treat with Penicillin x 7 days. Needs to follow up  with dentist next week as already planned. Treating early due to risk that this could trigger pain crisis. Patient aware to come back if needed.

## 2012-08-23 NOTE — Patient Instructions (Signed)
1. The tooth looks a little irritated so I want to treat early to avoid you getting a pain crisis. Take penicillin for a week three times a day. See dentist next week. You know what to look for already for pain crisis.  2. I refilled your nortriptyline. See me at the end of the month to repeat your bloodwork and follow up on headache.   3. QUIT SMOKING. Even 1 occasionally is bad for you. 1800quitnow is a good resource.   Thanks and see you at the end of the month,  Dr. Yong Channel

## 2012-08-25 IMAGING — CT CT NECK W/ CM
4 of 5 series · 16 of 33 positions shown, 19 images · IV contrast (75ml omni 300)
Comparison: CT head and neck without contrast 03/28/2010.

CLINICAL DATA: Right throat pain.  Rule out peritonsillar abscess.

CT NECK WITH CONTRAST
TECHNIQUE: Multidetector CT imaging of the neck was performed with
intravenous contrast.
Contrast: 75mL OMNIPAQUE IOHEXOL 300 MG/ML IV SOLN

[Series 2: 2cc/30ml and 1cc/45ml · axial · 0.49mm/px · z∈[+143,+243]mm · 3 of 81 slices shown]
[im 21/81  bone]
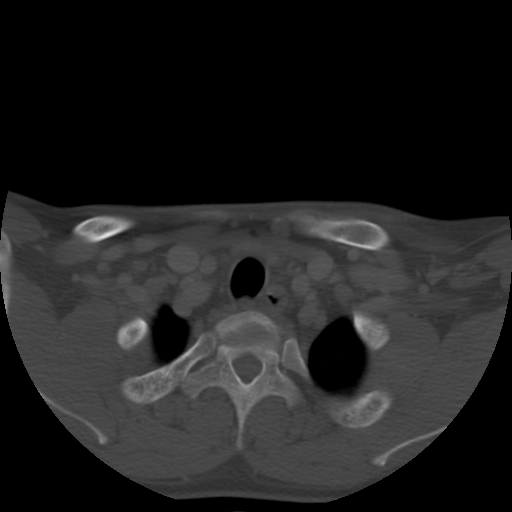
[im 41/81  bone]
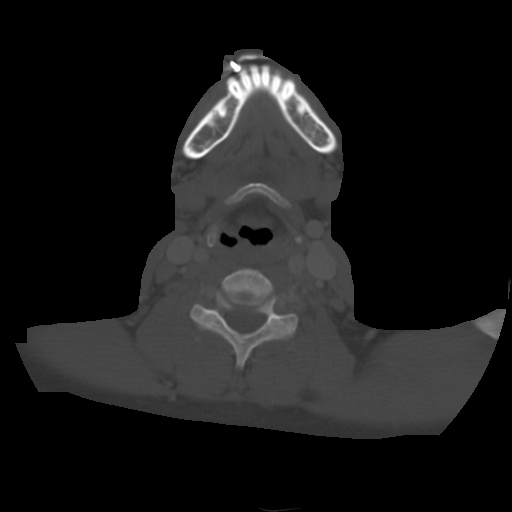
[im 61/81  bone]
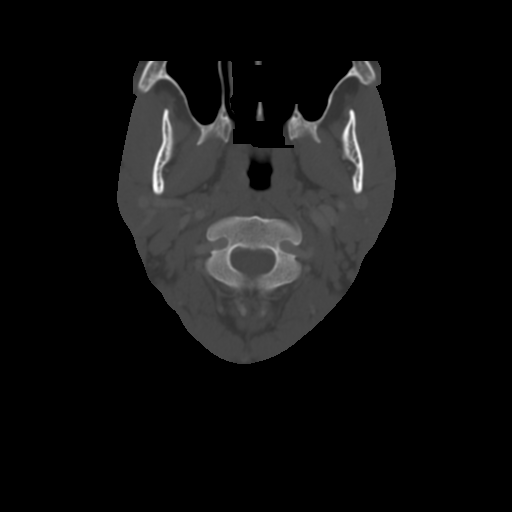

[Series 300: sag · sagittal · 0.49mm/px · 5 of 97 slices shown, 6 images]
[im 33/97  bone]
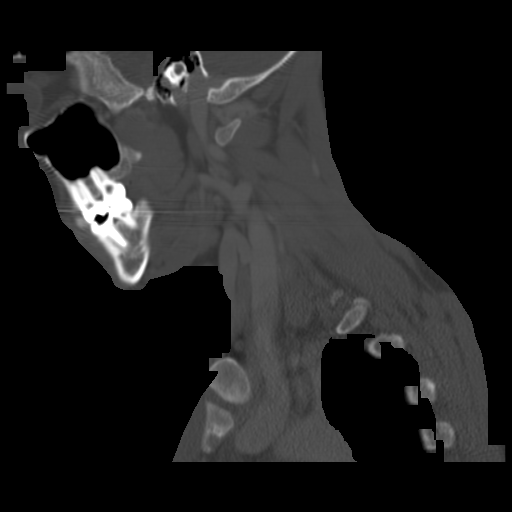
[im 41/97  bone]
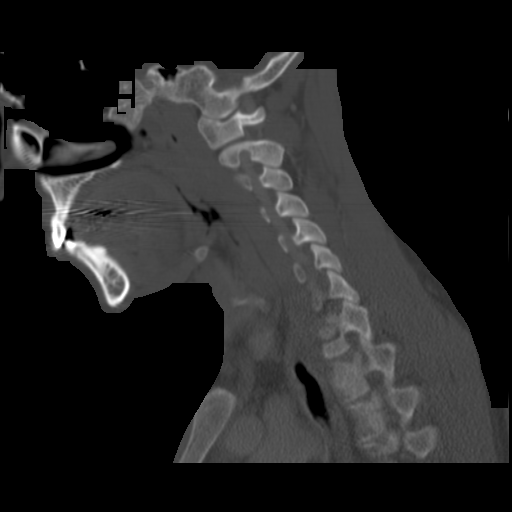
[im 49/97  soft-tissue]
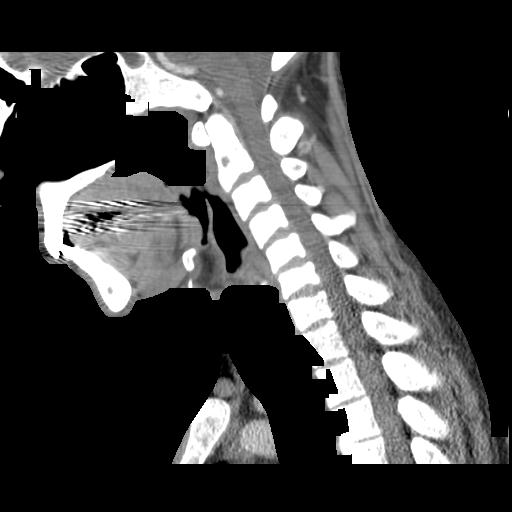
[im 49/97  bone]
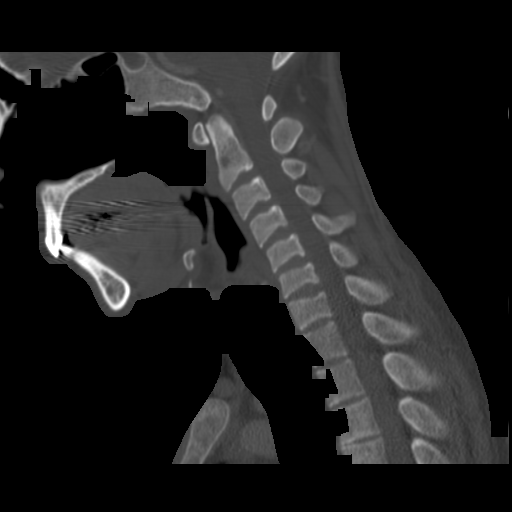
[im 57/97  bone]
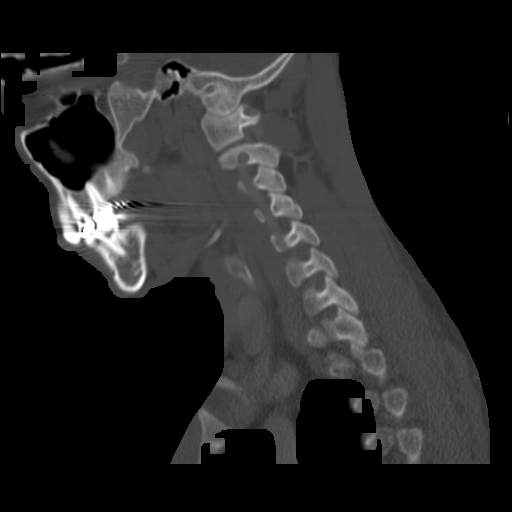
[im 65/97  bone]
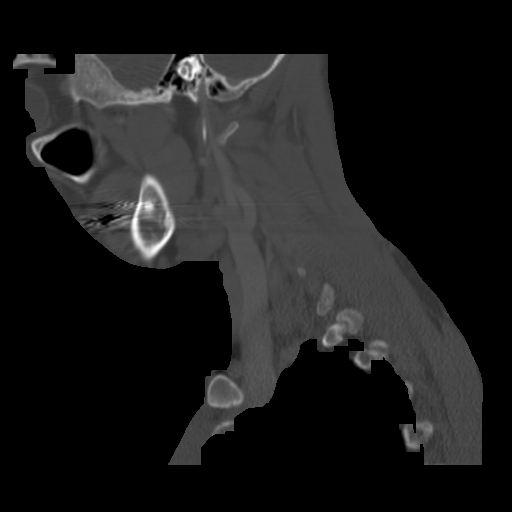

[Series 301: cor · coronal · 0.49mm/px · 3 of 97 slices shown]
[im 20/97  bone]
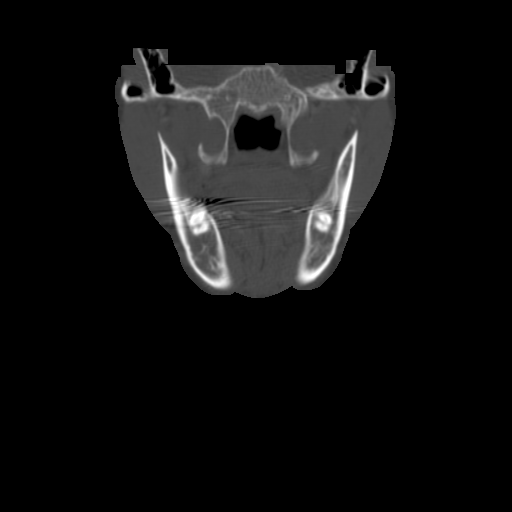
[im 39/97  bone]
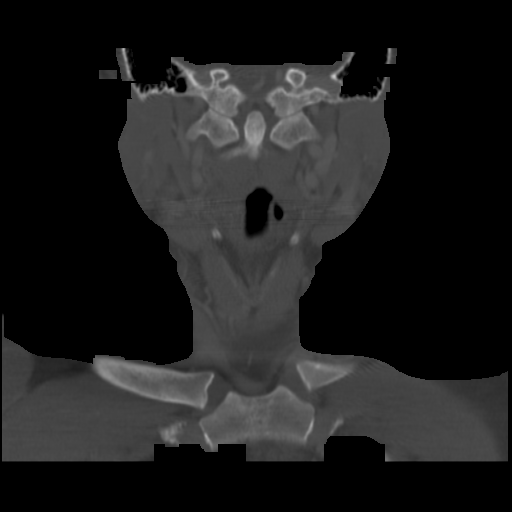
[im 58/97  bone]
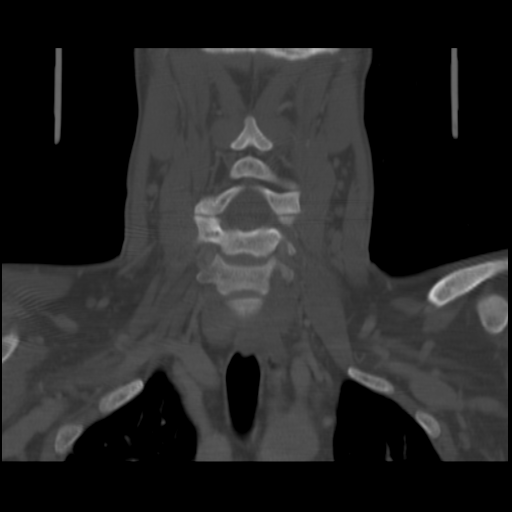

[Series 302: orthog · axial · 0.49mm/px · z∈[+80,+212]mm · 5 of 110 slices shown, 7 images]
[im 19/110  soft-tissue]
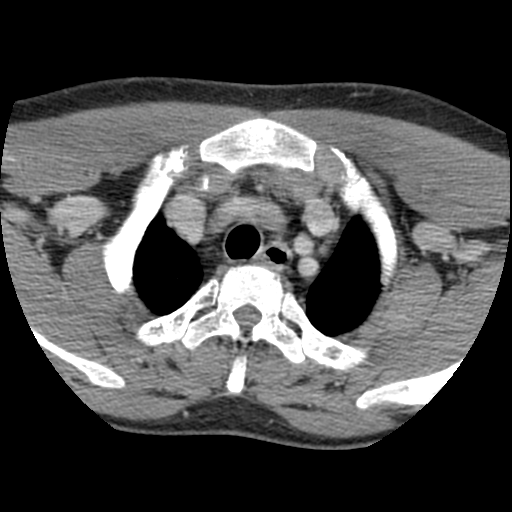
[im 19/110  bone]
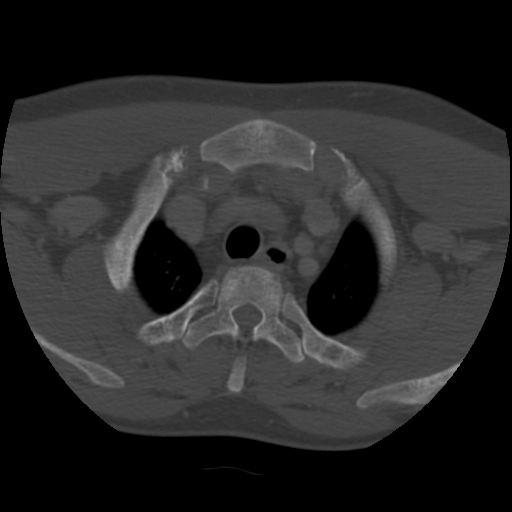
[im 37/110  bone]
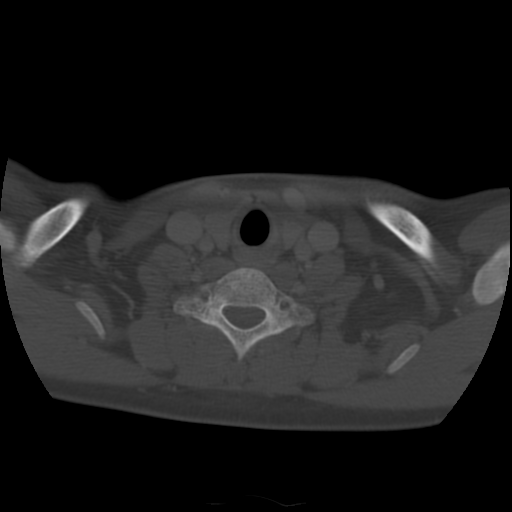
[im 55/110  bone]
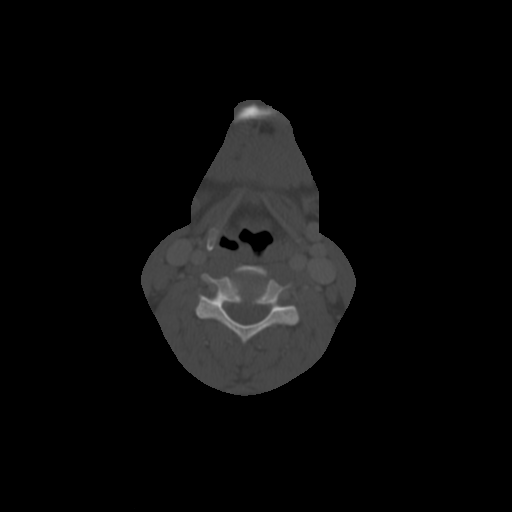
[im 73/110  bone]
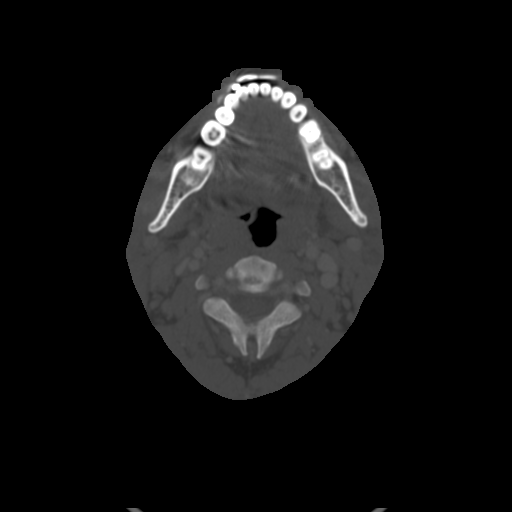
[im 91/110  soft-tissue]
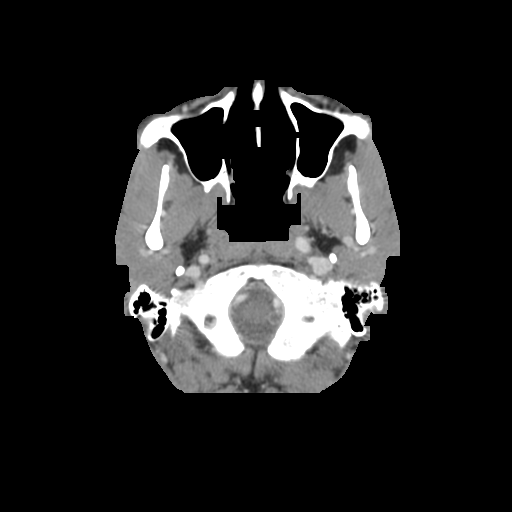
[im 91/110  bone]
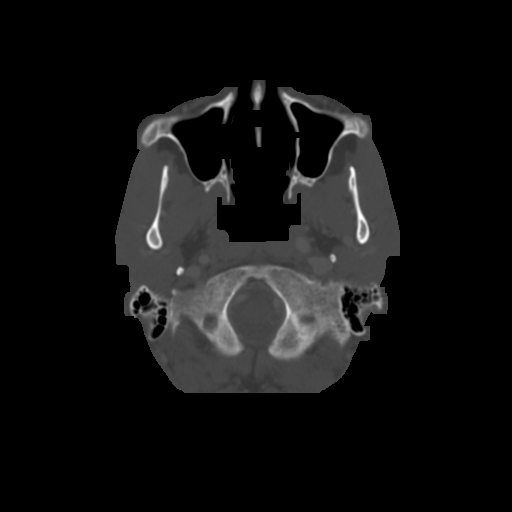

[16 of 33 positions shown; findings below may reference images not displayed]

FINDINGS: Limited imaging brain demonstrates a prominent CSF space
in the left middle cranial fossa.  Although volume loss has been
suggested, I favor this as an arachnoid cyst.  There is a smaller
arachnoid cyst on the right.

Asymmetric soft tissue enhancement is present in the right
tonsillar and peritonsillar area.  There is some gas on the right
which is likely within the folds of the right tonsil rather than
within the parenchyma proper.  There is some mucosal enhancement
extending to the right piriform recess.  The vocal cords are
midline and symmetric.

Enlarged right level II lymph nodes are likely reactive.  There are
smaller left sided lymph nodes, similar to the prior study.  These
are likely within normal limits for age.  Focal fatty infiltration
of the left parotid is also stable.

The lung apices are clear.  The bone windows are unremarkable.
IMPRESSION: 1.  Asymmetric prominence of right-sided soft tissue and
enhancement in the right tonsillar and peritonsillar area,
compatible with acute inflammation.
2.  Associated gas is likely within tonsillar folds rather than
representing a focal abscess.  The area should be amenable to
direct visualization.
3.  Asymmetric right-sided level II lymph nodes are likely
reactive.
4.  Stable smaller lymph nodes on the left.
5.  Probable arachnoid cysts as described.

## 2012-08-30 ENCOUNTER — Encounter: Payer: Self-pay | Admitting: Family Medicine

## 2012-08-30 ENCOUNTER — Telehealth: Payer: Self-pay | Admitting: *Deleted

## 2012-08-30 ENCOUNTER — Ambulatory Visit (INDEPENDENT_AMBULATORY_CARE_PROVIDER_SITE_OTHER): Payer: Medicare Other | Admitting: Family Medicine

## 2012-08-30 VITALS — BP 101/67 | HR 108 | Temp 98.1°F | Ht 64.0 in | Wt 118.0 lb

## 2012-08-30 DIAGNOSIS — D571 Sickle-cell disease without crisis: Secondary | ICD-10-CM

## 2012-08-30 DIAGNOSIS — F1123 Opioid dependence with withdrawal: Secondary | ICD-10-CM

## 2012-08-30 HISTORY — DX: Opioid dependence with withdrawal: F11.23

## 2012-08-30 MED ORDER — OXYCODONE-ACETAMINOPHEN 5-325 MG PO TABS: 1.0000 | ORAL_TABLET | Freq: Two times a day (BID) | ORAL | Status: DC | PRN

## 2012-08-30 MED ORDER — OXYCODONE HCL ER 15 MG PO T12A: 15.0000 mg | EXTENDED_RELEASE_TABLET | Freq: Two times a day (BID) | ORAL | Status: DC

## 2012-08-30 NOTE — Progress Notes (Signed)
  [name] [name] Reddish, MD Phone: [phone]  Subjective:   # Sickle Cell Disease, SS with chronic pain  From earlier phone note"Pt came into office today and dropped off a note from [place]. Dr. Flo [name] wrote in her note "This patient was seen in clinic for consultation. I have obtained urine drug screen and referred to med. Psych for screening. He did not receive prescription. Please continue writing." Per Dr. Yong [name] pt will not be receiving pain medication to have on hand in case of a pain crisis. He needs to come in and be seen in clinic when the pain increases. Note was left up front for pt to pick up. Tried calling his number but it was disconnected. Left a message with mom for pt to come and pick up note from his pain clinic. Will tell pt when he picks up note about his rx not being written. Will forward this note to Dr. Yong [name] for review. "  Patient states he has chronic pain on daily basis which flares from time to time. Mainly in lower legs and forearms with pain ranging from mild to severe. Often takes 2 20mg  oxycontin at a time (was given #45 a month ago but appears previously written as 20mg  BID). Patient states he has been out of the hospital a long time on this regimen. Discussed need to treat sickle cell pain crisis with medication but not daily pains with chronic narcotics. No fever/chills. No recent worsening of pain. Previously was on 40 mg oxycontin BID (but patient would take 2 at a time he states) and has been slowly weaned back to 30 mg then 20mg . Dr. Candace [name] did a UDS which showed cocaine and plan was for him to transition to pain clinic for further care with [place] helping during transition. States has appt aug 27th. Patient not currently in pain but states took last pill this morning.    ROS--See HPI  Past Medical History Patient Active Problem List   Diagnosis Date Noted  . Systolic murmur [id number]  . Migraine 11/26/2009  .  CHRONIC KIDNEY DISEASE STAGE II (MILD) 03/06/2009  . Sickle cell disease, type SS 06/18/2008  . TOBACCO ABUSE 05/22/2007   Reviewed problem list.  Medications- reviewed and updated Chief complaint-noted  Objective: BP 101/67  Pulse 108  Temp(Src) 98.1 F (36.7 C) (Oral)  Ht 5\' 4"  (1.626 m)  Wt 118 lb (53.524 kg)  BMI 20.24 kg/m2 Gen: NAD, resting comfortably in chair, becomes upset and shaky when discussing decreasing medications.  CV: RRR no murmurs rubs or gallops (not tachy on my exam) Lungs: CTAB no crackles, wheeze, rhonchi Skin: warm, dry, slight scleral icterus Neuro: grossly normal, moves all extremities, non-antalgic gait.   Assessment/Plan:

## 2012-08-30 NOTE — Patient Instructions (Signed)
Thanks for coming in today.  I have given you enough medicine for a month. This is primarily to keep you from withdrawal but I want you to focus on using the medicine when you are having a crisis and not using on a daily basis. Try to slowly come down on your medicine over time as it is best to use medicines when you are in crisis and not on a daily basis.  You will need to get future prescriptions through your pain clinic or [facility].   Thanks, Dr. Yong Channel

## 2012-08-30 NOTE — Assessment & Plan Note (Addendum)
Patient brought note from [facility] stating they did not give him medicine today but there is no indication if they will give in future. They ordered UDS and psych consult (full note to follow). I have scanned int he note. Given oxycontin 15 mg BID prn (#60) and Percocet 5/325 BID prn #60 to last until visit on Aug 27th. Discussed pain crisis would need to be treated with his pain medications and not his daily pains but agreed to prescribing current level of pills to avoid withdrawal from high dose narcotics. Advised patient to wean down slowly on daily use but to use medicine as needed for sickle cell pain crisis.   He reports he will get future medicine from [facility] and that they will simply increase him to his previous levels. I told patient that would be their choice but for his long term health and safety I preferred to slowly wean him down.

## 2012-08-30 NOTE — Telephone Encounter (Signed)
Pt came into office today and dropped off a note from [place].  Dr. Flo Shanks wrote in her note "This patient was seen in clinic for consultation.  I have obtained urine drug screen and referred to med. Psych for screening.  He did not receive prescription.  Please continue writing."  Per Dr. Yong Channel pt will not be receiving pain medication to have on hand in case of a pain crisis.  He needs to come in and be seen in clinic when the pain increases.  Note was left up front for pt to pick up.  Tried calling his number but it was disconnected.  Left a message with mom for pt to come and pick up note from his pain clinic.  Will tell pt when he picks up note about his rx not being written.  Will forward this note to Dr. Yong Channel for review.

## 2012-08-30 NOTE — Telephone Encounter (Signed)
See office visit 08/30/12

## 2012-09-13 ENCOUNTER — Encounter: Payer: Self-pay | Admitting: Family Medicine

## 2012-09-13 ENCOUNTER — Ambulatory Visit (INDEPENDENT_AMBULATORY_CARE_PROVIDER_SITE_OTHER): Payer: Medicare Other | Admitting: Family Medicine

## 2012-09-13 VITALS — BP 119/71 | HR 125 | Temp 98.1°F | Wt 121.0 lb

## 2012-09-13 DIAGNOSIS — D571 Sickle-cell disease without crisis: Secondary | ICD-10-CM

## 2012-09-13 MED ORDER — KETOROLAC TROMETHAMINE 30 MG/ML IJ SOLN
30.0000 mg | Freq: Once | INTRAMUSCULAR | Status: AC
  Administered 2012-09-13: 30 mg via INTRAMUSCULAR

## 2012-09-13 NOTE — Patient Instructions (Addendum)
It was nice to meet you today. I am sorry your pain is worse, but I hope you understand why I cannot give you medication today. I think the Toradol shot will really help.  Please make your remaining medication stretch out, since this was the original plan.  Follow up with Dr. Yong Channel on Monday morning to discuss your concerns. I made an appointment at 10:15 but if that doesn't work, you can reschedule it.  Chaslyn Eisen M. Luismiguel Lamere, M.D.

## 2012-09-13 NOTE — Assessment & Plan Note (Signed)
I discussed with patient that I will not be prescribing his medication today in cross cover clinic. Dr. Yong Channel was very specific about not giving additional medications, and I will not override that. He should still have plenty of his medications and he was advised that this should last him even though he states he "threw it away." Patient was not pleased with this answer. I have given him a Toradol 30mg  shot here today and he should follow up with Dr. Yong Channel to discuss his chronic pain.

## 2012-09-13 NOTE — Progress Notes (Signed)
Patient ID: [name] [name]., male   DOB: March 24, 1980, 32 y.o.   MRN: [id number]  [facility] Maximus Hoffert M. Genova Kiner, MD Phone: [phone]   Subjective: HPI: Patient is a 32 y.o. male presenting to clinic today for same day appointment for pain.  Pain is in legs and back. Went to the ED for fluids but wait was too long so he left. He was given medication on June 18 and given Oxycodone 5 which he states that he "threw in the trash" because it was like taking over the counter medication. He is taking the Oxycontin 20mg  multiple per day, maybe 3-4 days.  Patient is requesting his medication be refilled at the higher dose for his pain.  History Reviewed: Former smoker.  ROS: Please see HPI above.  Objective: Office vital signs reviewed. BP 119/71  Pulse 125  Temp(Src) 98.1 F (36.7 C) (Oral)  Wt 121 lb (54.885 kg)  BMI 20.76 kg/m2  Physical Examination:  General: Awake, alert. NAD. Appears comfortable HEENT: Atraumatic, normocephalic. MMM Pulm: CTAB, no wheezes Cardio: RRR, no murmurs appreciated Extremities: No edema Neuro: Grossly intact  Assessment: 32 y.o. male with sickle cell pain  Plan: See Problem List and After Visit Summary

## 2012-09-16 ENCOUNTER — Ambulatory Visit: Payer: Medicare Other | Admitting: Family Medicine

## 2012-09-25 ENCOUNTER — Encounter (HOSPITAL_COMMUNITY): Payer: Self-pay

## 2012-09-25 ENCOUNTER — Inpatient Hospital Stay (HOSPITAL_COMMUNITY)
Admission: EM | Admit: 2012-09-25 | Discharge: 2012-09-26 | DRG: 812 | Disposition: A | Discharge status: Home / Self Care | Payer: Medicare Other | Attending: Family Medicine | Admitting: Family Medicine

## 2012-09-25 ENCOUNTER — Inpatient Hospital Stay (HOSPITAL_COMMUNITY): Payer: Medicare Other

## 2012-09-25 DIAGNOSIS — D571 Sickle-cell disease without crisis: Secondary | ICD-10-CM | POA: Diagnosis present

## 2012-09-25 DIAGNOSIS — D5700 Hb-SS disease with crisis, unspecified: Principal | ICD-10-CM | POA: Diagnosis present

## 2012-09-25 DIAGNOSIS — N179 Acute kidney failure, unspecified: Secondary | ICD-10-CM | POA: Diagnosis present

## 2012-09-25 DIAGNOSIS — Z87891 Personal history of nicotine dependence: Secondary | ICD-10-CM

## 2012-09-25 DIAGNOSIS — E875 Hyperkalemia: Secondary | ICD-10-CM | POA: Diagnosis not present

## 2012-09-25 DIAGNOSIS — F1410 Cocaine abuse, uncomplicated: Secondary | ICD-10-CM | POA: Diagnosis present

## 2012-09-25 DIAGNOSIS — N184 Chronic kidney disease, stage 4 (severe): Secondary | ICD-10-CM | POA: Diagnosis present

## 2012-09-25 DIAGNOSIS — N189 Chronic kidney disease, unspecified: Secondary | ICD-10-CM | POA: Diagnosis present

## 2012-09-25 DIAGNOSIS — N182 Chronic kidney disease, stage 2 (mild): Secondary | ICD-10-CM | POA: Diagnosis present

## 2012-09-25 HISTORY — DX: Hb-SS disease with crisis, unspecified: D57.00

## 2012-09-25 HISTORY — DX: Cerebral cysts: G93.0

## 2012-09-25 HISTORY — DX: Personal history of other medical treatment: Z92.89

## 2012-09-25 HISTORY — DX: Gastro-esophageal reflux disease without esophagitis: K21.9

## 2012-09-25 HISTORY — DX: Migraine, unspecified, not intractable, without status migrainosus: G43.909

## 2012-09-25 HISTORY — DX: Chronic kidney disease, unspecified: N18.9

## 2012-09-25 LAB — CBC WITH DIFFERENTIAL/PLATELET
Basophils Absolute: 0 10*3/uL (ref 0.0–0.1)
Lymphs Abs: 9.8 10*3/uL — ABNORMAL HIGH (ref 0.7–4.0)
MCH: 42.2 pg — ABNORMAL HIGH (ref 26.0–34.0)
MCV: 116.7 fL — ABNORMAL HIGH (ref 78.0–100.0)
Monocytes Absolute: 1.5 10*3/uL — ABNORMAL HIGH (ref 0.1–1.0)
Neutrophils Relative %: 36 % — ABNORMAL LOW (ref 43–77)
Platelets: 204 10*3/uL (ref 150–400)
RBC: 1.8 MIL/uL — ABNORMAL LOW (ref 4.22–5.81)
RDW: 18.8 % — ABNORMAL HIGH (ref 11.5–15.5)
WBC: 18.3 10*3/uL — ABNORMAL HIGH (ref 4.0–10.5)

## 2012-09-25 LAB — RETICULOCYTES
RBC.: 1.8 MIL/uL — ABNORMAL LOW (ref 4.22–5.81)
Retic Ct Pct: 13.8 % — ABNORMAL HIGH (ref 0.4–3.1)

## 2012-09-25 LAB — COMPREHENSIVE METABOLIC PANEL
AST: 51 U/L — ABNORMAL HIGH (ref 0–37)
Albumin: 3.4 g/dL — ABNORMAL LOW (ref 3.5–5.2)
CO2: 24 mEq/L (ref 19–32)
Calcium: 8.2 mg/dL — ABNORMAL LOW (ref 8.4–10.5)
Creatinine, Ser: 1.89 mg/dL — ABNORMAL HIGH (ref 0.50–1.35)
GFR calc non Af Amer: 46 mL/min — ABNORMAL LOW (ref >90)
Total Protein: 5.6 g/dL — ABNORMAL LOW (ref 6.0–8.3)

## 2012-09-25 LAB — CREATININE, SERUM
Creatinine, Ser: 1.89 mg/dL — ABNORMAL HIGH (ref 0.50–1.35)
GFR calc Af Amer: 54 mL/min — ABNORMAL LOW (ref >90)
GFR calc non Af Amer: 46 mL/min — ABNORMAL LOW (ref >90)

## 2012-09-25 LAB — POCT I-STAT, CHEM 8
Creatinine, Ser: 2 mg/dL — ABNORMAL HIGH (ref 0.50–1.35)
Glucose, Bld: 94 mg/dL (ref 70–99)
HCT: 18 % — ABNORMAL LOW (ref 39.0–52.0)
Hemoglobin: 6.1 g/dL — CL (ref 13.0–17.0)
Sodium: 140 mEq/L (ref 135–145)
TCO2: 25 mmol/L (ref 0–100)

## 2012-09-25 LAB — HEPATIC FUNCTION PANEL
Alkaline Phosphatase: 121 U/L — ABNORMAL HIGH (ref 39–117)
Bilirubin, Direct: 0.3 mg/dL (ref 0.0–0.3)
Indirect Bilirubin: 3.3 mg/dL — ABNORMAL HIGH (ref 0.3–0.9)
Total Bilirubin: 3.6 mg/dL — ABNORMAL HIGH (ref 0.3–1.2)

## 2012-09-25 LAB — CBC
Platelets: 199 10*3/uL (ref 150–400)
RDW: 18.7 % — ABNORMAL HIGH (ref 11.5–15.5)
WBC: 15.4 10*3/uL — ABNORMAL HIGH (ref 4.0–10.5)

## 2012-09-25 LAB — HEMOGLOBIN AND HEMATOCRIT, BLOOD
HCT: 25.4 % — ABNORMAL LOW (ref 39.0–52.0)
Hemoglobin: 9.2 g/dL — ABNORMAL LOW (ref 13.0–17.0)

## 2012-09-25 LAB — RAPID URINE DRUG SCREEN, HOSP PERFORMED
Amphetamines: NOT DETECTED
Benzodiazepines: NOT DETECTED
Opiates: NOT DETECTED
Tetrahydrocannabinol: NOT DETECTED

## 2012-09-25 MED ORDER — ONDANSETRON HCL 4 MG/2ML IJ SOLN: 4.0000 mg | Freq: Four times a day (QID) | INTRAMUSCULAR | Status: DC | PRN

## 2012-09-25 MED ORDER — ONDANSETRON HCL 4 MG/2ML IJ SOLN
4.0000 mg | Freq: Once | INTRAMUSCULAR | Status: AC
  Administered 2012-09-25: 4 mg via INTRAVENOUS
  Filled 2012-09-25: qty 2

## 2012-09-25 MED ORDER — POLYETHYLENE GLYCOL 3350 17 G PO PACK
17.0000 g | PACK | Freq: Every day | ORAL | Status: DC
  Filled 2012-09-25 (×2): qty 1

## 2012-09-25 MED ORDER — HYDROMORPHONE HCL PF 1 MG/ML IJ SOLN
1.0000 mg | Freq: Once | INTRAMUSCULAR | Status: AC
  Administered 2012-09-25: 1 mg via INTRAVENOUS
  Filled 2012-09-25: qty 1

## 2012-09-25 MED ORDER — HYDROMORPHONE HCL PF 1 MG/ML IJ SOLN
1.0000 mg | INTRAMUSCULAR | Status: DC | PRN
  Administered 2012-09-25 – 2012-09-26 (×9): 1 mg via INTRAVENOUS
  Filled 2012-09-25 (×9): qty 1

## 2012-09-25 MED ORDER — SENNA 8.6 MG PO TABS
1.0000 | ORAL_TABLET | Freq: Two times a day (BID) | ORAL | Status: DC
  Filled 2012-09-25 (×4): qty 1

## 2012-09-25 MED ORDER — HYDROXYUREA 500 MG PO CAPS
500.0000 mg | ORAL_CAPSULE | Freq: Every day | ORAL | Status: DC
  Administered 2012-09-25: 500 mg via ORAL
  Filled 2012-09-25 (×2): qty 1

## 2012-09-25 MED ORDER — ACETAMINOPHEN 325 MG PO TABS: 650.0000 mg | ORAL_TABLET | Freq: Four times a day (QID) | ORAL | Status: DC | PRN

## 2012-09-25 MED ORDER — SODIUM CHLORIDE 0.9 % IV BOLUS (SEPSIS)
1000.0000 mL | Freq: Once | INTRAVENOUS | Status: AC
  Administered 2012-09-25: 1000 mL via INTRAVENOUS

## 2012-09-25 MED ORDER — KETOROLAC TROMETHAMINE 30 MG/ML IJ SOLN
30.0000 mg | Freq: Once | INTRAMUSCULAR | Status: AC
  Administered 2012-09-25: 30 mg via INTRAVENOUS
  Filled 2012-09-25: qty 1

## 2012-09-25 MED ORDER — HEPARIN SODIUM (PORCINE) 5000 UNIT/ML IJ SOLN: 5000.0000 [IU] | Freq: Three times a day (TID) | INTRAMUSCULAR | Status: DC

## 2012-09-25 MED ORDER — SODIUM CHLORIDE 0.9 % IV SOLN
INTRAVENOUS | Status: DC
  Administered 2012-09-25 – 2012-09-26 (×3): via INTRAVENOUS

## 2012-09-25 MED ORDER — SODIUM CHLORIDE 0.9 % IJ SOLN: 3.0000 mL | Freq: Two times a day (BID) | INTRAMUSCULAR | Status: DC

## 2012-09-25 MED ORDER — OXYCODONE HCL ER 10 MG PO T12A
20.0000 mg | EXTENDED_RELEASE_TABLET | Freq: Two times a day (BID) | ORAL | Status: DC
  Administered 2012-09-25 – 2012-09-26 (×3): 20 mg via ORAL
  Filled 2012-09-25 (×3): qty 2

## 2012-09-25 MED ORDER — ACETAMINOPHEN 650 MG RE SUPP: 650.0000 mg | Freq: Four times a day (QID) | RECTAL | Status: DC | PRN

## 2012-09-25 MED ORDER — ONDANSETRON HCL 4 MG PO TABS: 4.0000 mg | ORAL_TABLET | Freq: Four times a day (QID) | ORAL | Status: DC | PRN

## 2012-09-25 NOTE — ED Notes (Signed)
To x-ray

## 2012-09-25 NOTE — ED Notes (Signed)
Pt reports he has sickle cell and reports bilateral leg pain and lower back pain since 0300 this am.

## 2012-09-25 NOTE — ED Notes (Signed)
Per Dr Raoul Pitch hold heparin for now she will check w/ attending

## 2012-09-25 NOTE — ED Notes (Signed)
I stat chem 8 results given to Dr. Randal Buba by B. Yolanda Bonine, EMT

## 2012-09-25 NOTE — ED Notes (Signed)
IV team Carmell Austria states, ok to infuse blood through 22G Line in the hand.

## 2012-09-25 NOTE — Progress Notes (Signed)
PCP Social Visit Note [place]. Melanee Spry, MD, PGY-3  I have seen patient and discussed current hospitalization. I agree with the excellent care provided by the primary team of [facility] and want to thank them for their continued efforts in caring for my patient.   Patient is very frustrated with me for the recent reduction in his chronic pain medicine to 15 mg BID oxycontin from 20mg  BID and from 10/325 percocet to 5/325. He states he threw the percocet "in the toilet" because they were like over the counter medicine. He has not been hospitalized in 2 years and blames current hospitalization on this change as he was taking 3 pills of the 15 mg and ran out 3 weeks ago. He states he did not even want to be hospitalized, just wanted treatment in the ED but that he was told he needed to come in for transfusion. Denies recent infection. States cold days have been worse for him. Of note, patient no showed for an appointment with me on 09/16/12. Patient states he has follow up with [facility] on next Wednesday and he believes though is not certain that they will be able to deal with chronic medications. I went through office visits back through April (#5 including 3 with Dr. Verdie Drown) with patient where he was told we would not prescribe chronic daily narcotics for him. Concerns were that this was not appropriate for a stable sickle cell patient who has not had crisis but patient states the medicines "keep me out" of crisis. Other concern was previous cocaine positive urine. Patient states he is not using cocaine. 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 He adamantly refuses using.   I am very torn with this patient. I do appreciate that he  has been out of the hospital since 10/2010 but I do not think this provides a solid basis for daily narcotics. i would prefer that he be treated for flares instead of chronically taking medication. In short term, i have agreed to rx enough narcotics to be taken BID for 10 days until he can make it to his appointment with [facility] next week. I have told him if they do not prescribe in the future, I would want to find an alternative solution such as having him be seen at the sickle cell clinic in [place] as I am not comfortable in the long run with his current doses of medications for chronic daily pain which I am not sure is attributable to his sickle cell. My preference is that the family medicine center no longer prescribes him chronic narcotics and only for acute sickle cell pain crisis (and i question whether this is the best option either). The fact that the patient admits to selling cocaine is also of concern to me as he certainly could also be selling his narcotics. Of note, his urine was negative for narcotics and positive for cocaine on 06/26/12 when he claimed to have taken percocet the night prior as well as oxycontin 20mg  regularly. His urine is also negative for narcotics today though he claims he has not taken any for 3 weeks.   [name], MD, PGY3 09/25/2012 3:50 PM

## 2012-09-25 NOTE — ED Provider Notes (Signed)
CSN: [id number]     Arrival date & time 09/25/12  0506 History     First MD Initiated Contact with Patient 09/25/12 [phone]     Chief Complaint  Patient presents with  . Sickle Cell Pain Crisis   (Consider location/radiation/quality/duration/timing/severity/associated sxs/prior Treatment) Patient is a 32 y.o. male presenting with sickle cell pain. The history is provided by the patient.  Sickle Cell Pain Crisis Location:  Lower extremity Severity:  Severe Onset quality:  Sudden Duration:  2 hours Similar to previous crisis episodes: yes   Timing:  Constant Progression:  Unchanged Chronicity:  Chronic Sickle cell genotype:  SS History of pulmonary emboli: no   Context: not infection   Relieved by:  Nothing Worsened by:  Nothing tried Ineffective treatments:  None tried Associated symptoms: no chest pain, no fever and no headaches   Risk factors: no frequent admissions for fever     Past Medical History  Diagnosis Date  . Bacterial pneumonia   . Anemia     SS  . Sickle cell anemia    History reviewed. No pertinent past surgical history. No family history on file. History  Substance Use Topics  . Smoking status: Former Smoker    Quit date: 03/04/2010  . Smokeless tobacco: Never Used  . Alcohol Use: No    Review of Systems  Constitutional: Negative for fever.  Cardiovascular: Negative for chest pain and leg swelling.  Neurological: Negative for headaches.  All other systems reviewed and are negative.    Allergies  Morphine and related  Home Medications   Current Outpatient Rx  Name  Route  Sig  Dispense  Refill  . enalapril (VASOTEC) 5 MG tablet   Oral   Take 1 tablet (5 mg total) by mouth daily.   90 tablet   1   . hydroxyurea (HYDREA) 500 MG capsule   Oral   Take 500 mg by mouth daily.         . nortriptyline (PAMELOR) 25 MG capsule   Oral   Take 1 capsule (25 mg total) by mouth at bedtime.   30 capsule   11   . OxyCODONE (OXYCONTIN) 15 mg  T12A   Oral   Take 1 tablet (15 mg total) by mouth every 12 (twelve) hours.   55 tablet   0   . oxyCODONE-acetaminophen (ROXICET) 5-325 MG per tablet   Oral   Take 1 tablet by mouth 2 (two) times daily as needed for pain.   60 tablet   0    BP 117/70  Pulse 105  Temp(Src) 98.2 F (36.8 C) (Oral)  Resp 17  SpO2 98% Physical Exam  Constitutional: He is oriented to person, place, and time. He appears well-developed and well-nourished. No distress.  HENT:  Head: Normocephalic and atraumatic.  Mouth/Throat: Oropharynx is clear and moist.  Eyes: Pupils are equal, round, and reactive to light.  Icteric sclera  Neck: Normal range of motion. Neck supple.  Cardiovascular: Normal rate, regular rhythm, normal heart sounds and intact distal pulses.   Pulmonary/Chest: Effort normal and breath sounds normal. He has no wheezes. He has no rales.  Abdominal: Soft. Bowel sounds are normal. There is no tenderness. There is no rebound and no guarding.  Musculoskeletal: Normal range of motion. He exhibits no edema.  Neurological: He is alert and oriented to person, place, and time. He has normal reflexes.  Skin: Skin is warm and dry.  Psychiatric: He has a normal mood and affect.  ED Course   Procedures (including critical care time)  Labs Reviewed  POCT I-STAT, CHEM 8 - Abnormal; Notable for the following:    Creatinine, Ser 2.00 (*)    Hemoglobin 6.1 (*)    HCT 18.0 (*)    All other components within normal limits  RETICULOCYTES  CBC WITH DIFFERENTIAL  URINE RAPID DRUG SCREEN (HOSP PERFORMED)  HEPATIC FUNCTION PANEL   No results found. No diagnosis found.  MDM  Acute crisis not responding to medication  Samauri Kellenberger K Marguerita Stapp-Rasch, MD 09/25/12 [phone]

## 2012-09-25 NOTE — ED Notes (Signed)
Pt instructed a urine sample was needed. Pt states "I can't right now I'm in too much pain." MD notified. Toradol given.

## 2012-09-25 NOTE — H&P (Signed)
[facility] Admission History and Physical Service Pager: [phone]  Patient name: [name] [name]. Medical record number: [id number] Date of birth: 11-11-1980 Age: 32 y.o. Gender: male  Primary Care Provider: [name], MD Consultants: None Code Status: Full   Chief Complaint: Leg pain   Assessment and Plan: [name] [name]. is a 32 y.o. year old male presenting with leg and back pain. PMH is significant for Sickle cell disease (SS).   Sickle cell pain crisis:Labs significant for decreasing Hgb and elevated retic count. UDS positive for Cocaine. Tachycardic. Patient takes daily hydroxyurea.  - Pain: Dilaudid 1 mg Q3 PRN for pain. Will adjust as necessary. Refrain from further Toradol use with CKD - Hgb: 7.6 --> 6.1 today. Cross/screen and transfuse 2 units. CBC post transfusion ordered. - CXR ordered: No symptoms of acute chest currently.   CKD: History of CKD d/t SS disease with baseline creatinine of 1.4. Today creatinine is 1.89-2.0. Appears his creatinine has run about 2 for the last year. He received 2 doses of Toradol in the ED. - Refrain from nephrotoxic agents.  - Will continue to monitor Cr daily.  - He is on daily Acei for kidney protection, will hold for now.   FEN/GI: Regular diet Prophylaxis: SCD's for now, since will need to transfuse 2 units.   Disposition: Pending Sickle cell stabilization.   History of Present Illness: [name] [name]. is a 32 y.o. year old male with SS disease, presenting with pain in both his legs and back pain that started at 3 am this morning. He reports he did not do much yesterday and has not been sick.  Denies sick contacts or fever. Denies shortness of breath, chest pain, nausea, vomit, diarrhea or abdominal pain. Last pain crisis about 2 years ago. He endorses 8/10 pain currently after dilaudid. He reports he could not stand up and walk this morning d/t to pain.  He reports he has been  without pain medications for 3 weeks because his doctor is trying to "wean" him down. He reports he has too much pain and has to take double of prescribed to get back to his original dose and therefore has ran out early. No ETOH, smoke cigarette occasionally. Denied drug use.   Review Of Systems: Per HPI  Otherwise 12 point review of systems was performed and was unremarkable.  Patient Active Problem List   Diagnosis Date Noted  . Systolic murmur [id number]  . Migraine 11/26/2009  . CHRONIC KIDNEY DISEASE STAGE II (MILD) 03/06/2009  . Sickle cell disease, type SS 06/18/2008  . TOBACCO ABUSE 05/22/2007   Past Medical History: Past Medical History  Diagnosis Date  . Bacterial pneumonia   . Anemia     SS  . Sickle cell anemia    Past Surgical History: History reviewed. No pertinent past surgical history. Social History: History  Substance Use Topics  . Smoking status: Former Smoker    Quit date: 03/04/2010  . Smokeless tobacco: Never Used  . Alcohol Use: No   Additional social history: Denies ETOH, drugs and admits to occasional tobacco. Please also refer to relevant sections of EMR.  Family History: No family history on file. Allergies and Medications: Allergies  Allergen Reactions  . Morphine And Related Other (See Comments)    "real bad headaches"   No current facility-administered medications on file prior to encounter.   Current Outpatient Prescriptions on File Prior to Encounter  Medication Sig Dispense Refill  . enalapril (  VASOTEC) 5 MG tablet Take 1 tablet (5 mg total) by mouth daily.  90 tablet  1  . hydroxyurea (HYDREA) 500 MG capsule Take 500 mg by mouth daily.      . nortriptyline (PAMELOR) 25 MG capsule Take 1 capsule (25 mg total) by mouth at bedtime.  30 capsule  11  . OxyCODONE (OXYCONTIN) 15 mg T12A Take 1 tablet (15 mg total) by mouth every 12 (twelve) hours.  55 tablet  0  . oxyCODONE-acetaminophen (ROXICET) 5-325 MG per tablet Take 1 tablet by mouth 2  (two) times daily as needed for pain.  60 tablet  0    Objective: BP 173/116  Pulse 89  Temp(Src) 98.2 F (36.8 C) (Oral)  Resp 17  SpO2 93% Exam: General: Psychomotor agitation of legs. Appears in pain.  HEENT: AT.[place]. Mildly icteric eyes bilaterally. Bilateral TM are normal. MMM.  Neck: No LAD Cardiovascular: RRR. !/6 SM Respiratory: CTAB. No wheeze or rales.  Abdomen: Soft. NTND. BS present. No hepatosplenomegaly noted.  Extremities: No erythema or edema.  Skin: No rashes, petechiae or purpura.  Neuro: Alert. Oriented. CN grossly intact. No focal deficits.   Labs and Imaging: CBC BMET   Recent Labs Lab 09/25/12 0530 09/25/12 0543  WBC 18.3*  --   HGB 7.6* 6.1*  HCT 21.0* 18.0*  PLT 204  --     Recent Labs Lab 09/25/12 0543  NA 140  K 4.4  CL 106  BUN 22  CREATININE 2.00*  GLUCOSE 94     X-ray Chest Pa And Lateral   09/25/2012   *RADIOLOGY REPORT*  Clinical Data: Sickle cell crisis  CHEST - 2 VIEW  Comparison: Chest radiograph 12/05/ 2012  Findings: Normal cardiac silhouette.  There is increase in central venous pulmonary congestion.  No focal consolidation evident.  No pleural fluid.  No osseous abnormality.  Prior cholecystectomy. Minimal chronic bronchitic change centrally.  IMPRESSION: Mild increased central venous congestion.  No evidence of focal infiltrate.  Minimal chronic bronchitic change centrally.   Original Report Authenticated By: Suzy Bouchard, M.D.    Ma Hillock, DO 09/25/2012, 8:38 AM PGY-2, [facility] pager: [phone], text pages welcome

## 2012-09-26 DIAGNOSIS — D571 Sickle-cell disease without crisis: Secondary | ICD-10-CM

## 2012-09-26 DIAGNOSIS — N179 Acute kidney failure, unspecified: Secondary | ICD-10-CM | POA: Diagnosis present

## 2012-09-26 DIAGNOSIS — D5700 Hb-SS disease with crisis, unspecified: Secondary | ICD-10-CM | POA: Diagnosis present

## 2012-09-26 DIAGNOSIS — F1410 Cocaine abuse, uncomplicated: Secondary | ICD-10-CM | POA: Diagnosis present

## 2012-09-26 DIAGNOSIS — E875 Hyperkalemia: Secondary | ICD-10-CM | POA: Diagnosis not present

## 2012-09-26 LAB — CBC
Hemoglobin: 9.2 g/dL — ABNORMAL LOW (ref 13.0–17.0)
MCV: 101.6 fL — ABNORMAL HIGH (ref 78.0–100.0)
Platelets: 223 10*3/uL (ref 150–400)
RBC: 2.44 MIL/uL — ABNORMAL LOW (ref 4.22–5.81)
WBC: 20.9 10*3/uL — ABNORMAL HIGH (ref 4.0–10.5)

## 2012-09-26 LAB — BASIC METABOLIC PANEL
CO2: 19 mEq/L (ref 19–32)
Chloride: 109 mEq/L (ref 96–112)
Glucose, Bld: 83 mg/dL (ref 70–99)
Sodium: 136 mEq/L (ref 135–145)

## 2012-09-26 LAB — TYPE AND SCREEN
Antibody Screen: NEGATIVE
Donor AG Type: NEGATIVE
Donor AG Type: NEGATIVE
Unit division: 0
Unit division: 0

## 2012-09-26 MED ORDER — OXYCODONE HCL ER 20 MG PO T12A: 20.0000 mg | EXTENDED_RELEASE_TABLET | Freq: Two times a day (BID) | ORAL | Status: DC

## 2012-09-26 MED ORDER — OXYCODONE HCL 5 MG PO TABS: 10.0000 mg | ORAL_TABLET | Freq: Four times a day (QID) | ORAL | Status: DC | PRN

## 2012-09-26 MED ORDER — OXYCODONE-ACETAMINOPHEN 10-325 MG PO TABS: 1.0000 | ORAL_TABLET | Freq: Four times a day (QID) | ORAL | Status: DC | PRN

## 2012-09-26 NOTE — Discharge Summary (Signed)
[facility] Discharge Summary  Patient name: [name] [name]. Medical record number: [id number] Date of birth: [birth year] Age: 32 y.o. Gender: male Date of Admission: 09/25/2012  Date of Discharge: 09/26/2012 Admitting Physician: Blane Ohara McDiarmid, MD  Primary Care Provider: [name], MD Consultants: NONE  Indication for Hospitalization: leg and back pain consistent with sickle crisis  Discharge Diagnoses/Problem List:  Sickle cell SS disease with acute pain crisis Chronic kidney disease stage II, with acute kidney injury Hyperkalemia (s/p blood transfusion) Cocaine abuse  Disposition: HOME  Discharge Condition: IMPROVED  Brief Hospital Course: [name] [name] presented to the ED on 8/13 with bilateral leg and back pain concerning for sickle cell crisis and was also found to have acute on chronic kidney injury. PMH significant for sickle cell disease but has gone two years without a pain crisis.  Pt reports running out of pain medications three weeks prior to this visit because his primary care doctor had been attempting to "wean" him down.  He denied drug use; pt tested negative for opiates but was positive for cocaine.  Afterward, patient admitted to selling cocaine and that some must have "seeped through" his pores, though he continued to denies cocaine use. Pain was controlled with IV and PO narcotic and NSAID medication, though NSAIDs after the ED were held along with pt's long-term ACE inhibitor. See below for details by problem list.  #Sickle cell pain crisis: -CXR showed no signs of acute chest syndrome -Hb dropped from 7.6 on presentation to 6.1 --> transfused 2 units pRBC --> Hb improved/stabilized to 9.2. -pain controlled with Dilaudid and Toradol initially, transitioned to oxycodone 20mg  BID with Percocet 10-325 q4 PRN -pt discussed narcotics with his PCP Dr. Yong Channel this admission  -PCP had previously tried lowering patient's Oxycontin  from 20 mg to 15 mg and Percocet from 10-325 to 5-325  -pt expressed displeasure with changes and blamed current crisis on reduced doses   -decision was made to Rx 10 days of Oxycontin 20mg  BID and Percocet 10-325  -Rx's should be sufficient to last until pt's appt with pain clinic 8/20 -of note, as above, pt tested negative for opioids <24h after stated last dose, and positive for cocaine  #CKD stage II with acute kidney injury:  -secondary to sickle cell disease with a baseline Cr of 1.4 (high as 2 within the last year); trend 2 > 1.89 > 1.67 -received 2 doses of Toradol in the ED, held afterward due to increased Cr and little impact on pain -home ACE inhibitor was held during admission, resumed at discharge  #Hyperkalemia -noted after transfusion as above (presentation 4.4 > 6.0 on 8/14 AM) -rechecked at 5.4, trending down    #Cocaine abuse:  -concern for use as above, though pt adamantly denied use as above -counseled on abstinence from use and instructed to follow up with PCP and/or consider rehab  Issues for Follow Up:  #Sickle cell pain crisis: Patient is on a high dose of narcotics and would like to stay on them indefinitely for chronic pain and because he believes they ward off pain crises.  Pt has been told by several providers of [facility] that [facility] residents will not be prescribing chronic narcotics.  Patient has previously-scheduled meeting with [facility] on Wednesday 8/20 for continued pain management.  Pt was given Rx for Oxycontin and Percocet for 10 days supply in order to have pain medication until this appointment. Of note, patient has tested positive  for cocaine on this visit (though was negative for opioids)) and has had previous cocaine-positive drug screens, as well, as recently as May 2014.  Significant Procedures: NONE  Significant Labs and Imaging:   Recent Labs Lab 09/25/12 0530  09/25/12 0858 09/25/12 2320 09/26/12 0625  WBC 18.3*   --  15.4*  --  20.9*  HGB 7.6*  < > 6.1* 9.2* 9.2*  HCT 21.0*  < > 16.8* 25.4* 24.8*  PLT 204  --  199  --  223  < > = values in this interval not displayed.  Recent Labs Lab 09/25/12 0530  09/25/12 0543 09/25/12 0858 09/26/12 0625 09/26/12 1441  NA  --   --  140 140 136  --   K  --   < > 4.4 4.6 6.0* 5.4*  CL  --   --  106 108 109  --   CO2  --   --   --  24 19  --   GLUCOSE  --   --  94 94 83  --   BUN  --   --  22 21 17   --   CREATININE  --   --  2.00* 1.89*  1.89* 1.67*  --   CALCIUM  --   --   --  8.2* 8.7  --   ALKPHOS 121*  --   --  108  --   --   AST 41*  --   --  51*  --   --   ALT 20  --   --  23  --   --   ALBUMIN 3.9  --   --  3.4*  --   --   < > = values in this interval not displayed.  X-ray Chest Pa And Lateral  09/25/2012 *RADIOLOGY REPORT* Clinical Data: Sickle cell crisis CHEST - 2 VIEW Comparison: Chest radiograph 12/05/ 2012 Findings: Normal cardiac silhouette. There is increase in central venous pulmonary congestion. No focal consolidation evident. No pleural fluid. No osseous abnormality. Prior cholecystectomy. Minimal chronic bronchitic change centrally. IMPRESSION: Mild increased central venous congestion. No evidence of focal infiltrate. Minimal chronic bronchitic change centrally. Original Report Authenticated By: Suzy Bouchard, M.D  Outstanding Results: NONE  Discharge Medications:    Medication List    STOP taking these medications       oxyCODONE-acetaminophen 5-325 MG per tablet  Commonly known as:  ROXICET  Replaced by:  oxyCODONE-acetaminophen 10-325 MG per tablet      TAKE these medications       enalapril 5 MG tablet  Commonly known as:  VASOTEC  Take 1 tablet (5 mg total) by mouth daily.     hydroxyurea 500 MG capsule  Commonly known as:  HYDREA  Take 500 mg by mouth daily.     nortriptyline 25 MG capsule  Commonly known as:  PAMELOR  Take 1 capsule (25 mg total) by mouth at bedtime.     OxyCODONE 20 mg T12a 12 hr tablet   Commonly known as:  OXYCONTIN  Take 1 tablet (20 mg total) by mouth 2 (two) times daily.     oxyCODONE-acetaminophen 10-325 MG per tablet  Commonly known as:  PERCOCET  Take 1 tablet by mouth every 6 (six) hours as needed for pain.       Discharge Instructions: Please refer to Patient Instructions section of EMR for full details.  Patient was counseled important signs and symptoms that should prompt return to medical care, changes in  medications, dietary instructions, activity restrictions, and follow up appointments.   Follow-Up Appointments: Follow-up Information   Follow up with [name], MD On 10/11/2012. (Appointment at 3:30 PM.)    Specialty:  Family Medicine   Contact information:   [address] [place] [address] [phone]       Follow up with [facility]. (Appointment on 8/20)      Jerlyn Ly, Med Student 09/26/2012, 1:37 PM  FPTS Upper Level Resident Addendum  I independently interviewed and examined this patient. His discharge planning as outlined above was discussed and arranged by the [facility] team as a whole, including attending physician Dr. McDiarmid. I have discussed the above with [name] [name] and agree with his documentation as above. The above reflects his original note with my edits for correction/additions/clarification/emphasis.  Emmaline Kluver, MD PGY-2, [facility] 09/26/2012, 7:52 PM [facility] Service pager: [phone] (text pages welcome through [facility])

## 2012-09-26 NOTE — Progress Notes (Signed)
I discussed with  Dr Yong Channel.  I agree with their plans documented in their admit note for today.

## 2012-09-26 NOTE — Progress Notes (Addendum)
Family Medicine Teaching Service Daily Progress Note Intern Pager: [phone]  Patient name: [name] [name]. Medical record number: [id number] Date of birth: [birth year] Age: 32 y.o. Gender: male  Primary Care Provider: [name], MD Consultants: NONE Code Status: FULL  Pt Overview and Major Events to Date: [name] [name]. is a 32 y.o. year old male presenting with leg and back pain. PMH is significant for Sickle cell disease (SS).   Assessment and Plan:  #Sickle cell pain crisis:Labs significant for decreasing Hgb and elevated retic count. UDS positive for Cocaine. Tachycardic. Patient takes daily hydroxyurea.  - Pain: Dilaudid 1 mg Q2H PRN, oxycodone 20mg  BID for pain. Will adjust as necessary. Refrain from further Toradol use with CKD. - Per Dr. Ansel Bong discussion with patient, will issue enough oxycodone at current dose upon discharge until patient's f/u appointment with [facility] next Wednesday - s/p transfusion of 2 units pRBCs; Hgb: 6.1 --> 9.2 today. - CXR ordered: No symptoms of acute chest currently.   #CKD: History of CKD d/t SS disease with baseline creatinine of 1.4. Appears his creatinine has run about 2 for the last year. He received 2 doses of Toradol in the ED.  - Refrain from nephrotoxic agents.  - Cr improving (1.89 --> 1.67) - He is on daily ACEI at home for kidney protection, will hold for now.   FEN/GI: Regular diet  Prophylaxis: SCD's, s/p transfusion of 2 units pRBCs.    Disposition: home today as SS crisis resolving  Subjective: Patient reports pain is a 2/3 out of 10 this morning, much improved.  Eager to go home.  Objective: Temp:  [97.6 F (36.4 C)-98.3 F (36.8 C)] 98.2 F (36.8 C) (08/14 0434) Pulse Rate:  [71-110] 93 (08/14 0434) Resp:  [13-20] 18 (08/14 0434) BP: (96-122)/(59-85) 112/78 mmHg (08/14 0434) SpO2:  [93 %-100 %] 93 % (08/14 0434) Weight:  [123 lb 0.3 oz (55.8 kg)] 123 lb 0.3 oz (55.8 kg) (08/14  0434) Physical Exam: General: Patient laying in bed eating breakfast, comfortable, in NAD  HEENT: AT.[place]. Mildly icteric eyes bilaterally. MMM.  Neck: No LAD  Cardiovascular: RRR. No murmurs, rubs or gallops Respiratory: CTAB. No wheeze or rales.  Abdomen: Soft. NTND. BS present. No hepatosplenomegaly noted.  Extremities: No erythema or edema.  Skin: No rashes, petechiae or purpura.  Neuro: Alert. Oriented. CN grossly intact. No focal deficits.   Laboratory:  Recent Labs Lab 09/25/12 0530 09/25/12 0543 09/25/12 0858 09/25/12 2320  WBC 18.3*  --  15.4*  --   HGB 7.6* 6.1* 6.1* 9.2*  HCT 21.0* 18.0* 16.8* 25.4*  PLT 204  --  199  --     Recent Labs Lab 09/25/12 0530 09/25/12 0543 09/25/12 0858 09/26/12 0625  NA  --  140 140 136  K  --  4.4 4.6 6.0*  CL  --  106 108 109  CO2  --   --  24 19  BUN  --  22 21 17   CREATININE  --  2.00* 1.89*  1.89* 1.67*  CALCIUM  --   --  8.2* 8.7  PROT 6.2  --  5.6*  --   BILITOT 3.6*  --  3.5*  --   ALKPHOS 121*  --  108  --   ALT 20  --  23  --   AST 41*  --  51*  --   GLUCOSE  --  94 94 83   Imaging/Diagnostic Tests: X-ray Chest Pa And  Lateral  09/25/2012 *RADIOLOGY REPORT* Clinical Data: Sickle cell crisis CHEST - 2 VIEW Comparison: Chest radiograph 12/05/ 2012 Findings: Normal cardiac silhouette. There is increase in central venous pulmonary congestion. No focal consolidation evident. No pleural fluid. No osseous abnormality. Prior cholecystectomy. Minimal chronic bronchitic change centrally. IMPRESSION: Mild increased central venous congestion. No evidence of focal infiltrate. Minimal chronic bronchitic change centrally. Original Report Authenticated By: Suzy Bouchard, M.D   Jerlyn Ly, Med Student 09/26/2012, 9:27 AM FPTS Intern pager: [phone], text pages welcome  S: Pt feeling much better today. Eager to go home. Pain 2/10. No difficulty breathing, no fevers/chills. Appetite good. No difficulty with urination or  BMs.  O: BP 119/77  Pulse 104  Temp(Src) 98.4 F (36.9 C) (Oral)  Resp 18  Ht 5\' 4"  (1.626 m)  Wt 123 lb 0.3 oz (55.8 kg)  BMI 21.11 kg/m2  SpO2 94% General: adult male sitting upa t bedside, eating breakfast, in NAD Cardiovascular: RRR, no murmur appreciated Respiratory: CTAB, no wheeze appreciated, normal WOB Abdomen: soft, nondistended, BS+ MSK: mild pelvic/shoulder girdle tenderness to palpation, but good ROM  Pt moves about without slowing or assistance Extremities: No LE edema Skin: warm, dry, no rashes  A/P: 32yo male admitted with sickle crisis. Pain improved, Hb improved s/p transfusion.  #Sickle crisis - improved, plan to discharge with PO medications sufficient to make outpt pain clinic appt -see Dr. Ansel Bong prior note re: pain medication Rx's -Hb improved, 6.1 > 9.2 s/p transfusion -K elevated this AM after transfusion, but pt refused recollection to ensure not still elevated or climbing  #Acute on chronic kidney injury/CKD - context of sickle SS disease, baseline Cr 1.4 (high as 2 in the past) -Cr trending down 1.89 to 1.67; did receive Toradol in the ED -holding ACE, will likely resume at discharge  FEN/GI: regular diet, saline lock IV Prophylaxis: SCD's  Dispo: discharge home today  I have independently interviewed and examined this patient in conjunction with the student, whose note is documented below. The above reflects my independent interview, exam, and assessment/plan. Please see also attached problem list notes from today's visit for specifics for each problem.  [name] Kluver, MD PGY-2, [place] FMTS note I have seen and examined this patient. I have discussed with Dr Venetia Maxon.  I agree with their findings and plans as documented in their progress note.

## 2012-09-26 NOTE — H&P (Signed)
I discussed with  Dr Raoul Pitch.  I agree with their plans documented in their admit note for today.

## 2012-09-26 NOTE — Progress Notes (Signed)
PGY-2 Update Note  Pt initially refused lab draw to recheck potassium as documented in previous progress note. Dr. McDiarmid discussed this with pt, who agreed to blood draw prior to discharge. K resulted 5.4, trending down. Believe pt is safe to discharge.  Emmaline Kluver, MD PGY-2, [facility] 09/26/2012, 3:00 PM FPTS Service pager: [phone] (text pages welcome through [facility])

## 2012-09-26 NOTE — Care Management Note (Signed)
    Page 1 of 1   09/26/2012     1:04:19 PM   CARE MANAGEMENT NOTE 09/26/2012  Patient:  [name] [name], [name] [name]   Account Number:  [id number]  Date Initiated:  09/26/2012  Documentation initiated by:  Tomi Bamberger  Subjective/Objective Assessment:   dx ss crisis  admit- lives with mom.  pta indep.     Action/Plan:   Anticipated DC Date:  09/26/2012   Anticipated DC Plan:  [place]  CM consult      Choice offered to / List presented to:             Status of service:  Completed, signed off Medicare Important Message given?   (If response is "NO", the following Medicare IM given date fields will be blank) Date Medicare IM given:   Date Additional Medicare IM given:    Discharge Disposition:  HOME/SELF CARE  Per UR Regulation:  Reviewed for med. necessity/level of care/duration of stay  If discussed at [place] of Stay Meetings, dates discussed:    Comments:  09/26/12 11:06 Tomi Bamberger RN, BSN [phone] patient lives with mom, no needs anticipated.

## 2012-09-27 NOTE — Discharge Summary (Signed)
I have seen and examined this patient. I have discussed with Dr Street.  I agree with their findings and plans as documented in their progress note.    

## 2012-10-05 ENCOUNTER — Encounter (HOSPITAL_COMMUNITY): Payer: Self-pay | Admitting: *Deleted

## 2012-10-05 ENCOUNTER — Emergency Department (INDEPENDENT_AMBULATORY_CARE_PROVIDER_SITE_OTHER)
Admission: EM | Admit: 2012-10-05 | Discharge: 2012-10-05 | Disposition: A | Discharge status: Home / Self Care | Payer: Medicare Other | Source: Home / Self Care

## 2012-10-05 ENCOUNTER — Other Ambulatory Visit (HOSPITAL_COMMUNITY)
Admission: RE | Admit: 2012-10-05 | Discharge: 2012-10-05 | Disposition: A | Discharge status: Home / Self Care | Payer: Medicare Other | Source: Ambulatory Visit | Attending: Family Medicine | Admitting: Family Medicine

## 2012-10-05 DIAGNOSIS — Z113 Encounter for screening for infections with a predominantly sexual mode of transmission: Secondary | ICD-10-CM | POA: Insufficient documentation

## 2012-10-05 DIAGNOSIS — N342 Other urethritis: Secondary | ICD-10-CM

## 2012-10-05 MED ORDER — CEFTRIAXONE SODIUM 250 MG IJ SOLR
INTRAMUSCULAR | Status: AC
  Filled 2012-10-05: qty 250

## 2012-10-05 MED ORDER — CEFTRIAXONE SODIUM 250 MG IJ SOLR
250.0000 mg | Freq: Once | INTRAMUSCULAR | Status: AC
  Administered 2012-10-05: 250 mg via INTRAMUSCULAR

## 2012-10-05 MED ORDER — METRONIDAZOLE 500 MG PO TABS
ORAL_TABLET | ORAL | Status: AC
  Filled 2012-10-05: qty 4

## 2012-10-05 MED ORDER — ONDANSETRON 4 MG PO TBDP
ORAL_TABLET | ORAL | Status: AC
  Filled 2012-10-05: qty 1

## 2012-10-05 MED ORDER — METRONIDAZOLE 500 MG PO TABS: 2000.0000 mg | ORAL_TABLET | Freq: Once | ORAL | Status: DC

## 2012-10-05 MED ORDER — ONDANSETRON 4 MG PO TBDP
4.0000 mg | ORAL_TABLET | Freq: Once | ORAL | Status: AC
  Administered 2012-10-05: 4 mg via ORAL

## 2012-10-05 MED ORDER — AZITHROMYCIN 250 MG PO TABS
ORAL_TABLET | ORAL | Status: AC
  Filled 2012-10-05: qty 4

## 2012-10-05 MED ORDER — AZITHROMYCIN 250 MG PO TABS: 1000.0000 mg | ORAL_TABLET | Freq: Once | ORAL | Status: DC

## 2012-10-05 NOTE — ED Provider Notes (Signed)
CSN: [id number]     Arrival date & time 10/05/12  [id number] History     None    Chief Complaint  Patient presents with  . Exposure to STD   (Consider location/radiation/quality/duration/timing/severity/associated sxs/prior Treatment) Patient is a 32 y.o. male presenting with STD exposure. The history is provided by the patient. No language interpreter was used.  Exposure to STD This is a new problem. The current episode started 3 to 5 hours ago. The problem occurs constantly. The problem has been gradually worsening. Nothing aggravates the symptoms. Nothing relieves the symptoms. He has tried nothing for the symptoms. The treatment provided no relief.   Pt complains of exposure to chlamydia and trichomonas Past Medical History  Diagnosis Date  . Sickle cell anemia   . Sickle cell crisis 09/25/2012  . Bacterial pneumonia ~ 2012    "caught it here in the hospital" (09/25/2012)  . History of blood transfusion     "always related to sickle cell crisis" (09/25/2012)  . GERD (gastroesophageal reflux disease)     "after I eat alot of spicey foods" (09/25/2012)  . Migraines     "take RX qd to prevent them" (09/25/2012)  . Cyst of brain     "2 really small ones in the back of my head; inside; saw them w/MRI" (09/25/2012)  . Chronic kidney disease     "from my sickle cell" (09/25/2012)   Past Surgical History  Procedure Laterality Date  . Cholecystectomy  ~ 2012   No family history on file. History  Substance Use Topics  . Smoking status: Current Some Day Smoker    Types: Cigarettes  . Smokeless tobacco: Never Used     Comment: 09/25/2012 "I don't buy cigarettes; bum one from friends q now and then"  . Alcohol Use: No    Review of Systems  Genitourinary: Positive for discharge.  All other systems reviewed and are negative.    Allergies  Morphine and related  Home Medications   Current Outpatient Rx  Name  Route  Sig  Dispense  Refill  . enalapril (VASOTEC) 5 MG tablet   Oral  Take 1 tablet (5 mg total) by mouth daily.   90 tablet   1   . hydroxyurea (HYDREA) 500 MG capsule   Oral   Take 500 mg by mouth daily.         . nortriptyline (PAMELOR) 25 MG capsule   Oral   Take 1 capsule (25 mg total) by mouth at bedtime.   30 capsule   11   . OxyCODONE (OXYCONTIN) 20 mg T12A 12 hr tablet   Oral   Take 1 tablet (20 mg total) by mouth 2 (two) times daily.   20 tablet   0   . oxyCODONE-acetaminophen (PERCOCET) 10-325 MG per tablet   Oral   Take 1 tablet by mouth every 6 (six) hours as needed for pain.   40 tablet   0    BP 125/80  Pulse 94  Temp(Src) 98.5 F (36.9 C) (Oral)  SpO2 100% Physical Exam  Nursing note and vitals reviewed. Constitutional: He appears well-developed and well-nourished.  HENT:  Head: Normocephalic.  Cardiovascular: Normal rate and normal heart sounds.   Pulmonary/Chest: Effort normal and breath sounds normal.  Musculoskeletal: Normal range of motion.  Neurological: He is alert.  Skin: Skin is warm.    ED Course   Procedures (including critical care time)  Labs Reviewed  URINE CYTOLOGY ANCILLARY ONLY   No results found.  1. Urethritis     MDM  Pt given rocephin, zithromax, and flagyl  Fransico Meadow, PA-C 10/05/12 [facility] [place], MD 10/05/12 [phone]

## 2012-10-05 NOTE — ED Notes (Signed)
Pt  Reports   He  Was  Told  By a  Partner  That  He  Was  Exposed  To  Ann  Std   At this  Time  He  Reports  Symptoms  Of      Tingling  When he  Urinates  As  Well  As  Some itching  Sensation

## 2012-10-11 ENCOUNTER — Ambulatory Visit: Payer: Medicare Other | Admitting: Family Medicine

## 2012-10-11 ENCOUNTER — Ambulatory Visit (INDEPENDENT_AMBULATORY_CARE_PROVIDER_SITE_OTHER): Payer: Medicare Other | Admitting: Family Medicine

## 2012-10-11 ENCOUNTER — Encounter: Payer: Self-pay | Admitting: Family Medicine

## 2012-10-11 VITALS — BP 122/78 | HR 118 | Temp 98.9°F | Wt 120.0 lb

## 2012-10-11 DIAGNOSIS — D571 Sickle-cell disease without crisis: Secondary | ICD-10-CM

## 2012-10-11 DIAGNOSIS — Z23 Encounter for immunization: Secondary | ICD-10-CM

## 2012-10-11 MED ORDER — OXYCODONE HCL ER 20 MG PO T12A: 20.0000 mg | EXTENDED_RELEASE_TABLET | Freq: Two times a day (BID) | ORAL | Status: DC

## 2012-10-11 MED ORDER — OXYCODONE-ACETAMINOPHEN 10-325 MG PO TABS: 1.0000 | ORAL_TABLET | Freq: Three times a day (TID) | ORAL | Status: DC | PRN

## 2012-10-11 NOTE — Patient Instructions (Addendum)
[name] [name],  I am glad you are doing better after your hospitalization. We discussed today that with no exceptions we will no longer be prescribing daily chronic pain medicine at this clinic after this visit. You have been given enough medicine to get to your October 16th pain medicine clinic visit with the sickle cell specialists. I have expressed my concerns to you including admitted drug dealing and urine drug screens positive for cocaine but negative for pain medicine. My concern is that with the previous description, you could be selling the medication you are being given. From now on, we will only treat you for pain crisis in the hospital and for short term improvement after these. If the pain clinic decides to prescribe chronic daily medicine, they are welcome to do so. I appreciate your understanding of my/our clinic perspective.    Come back approximately September 10th for your 2 week labs. Make a lab visit.   Thanks, Dr. Yong Channel  See [place] at least yearly for regular health maintenance.  Health Maintenance Due  Topic Date Due  . Influenza Vaccine -you can get this today 09/13/2012

## 2012-10-12 ENCOUNTER — Encounter: Payer: Self-pay | Admitting: Family Medicine

## 2012-10-12 NOTE — Assessment & Plan Note (Addendum)
No further crisis since hospitalization. given enough pain medicine for 45 days to last until appointment until 11/28/12 with [facility] chronic pain medicine clinic but patient understands we will NO longer prescribe chronic daily pain medicine and will only treat flares (see AVS). If he does not get pain medicine at the [facility] then we will attempt to get him into sickle cell center but I am adamant about not filling this in patient who admits to drug dealing, is cocaine positive, narcotic negative despite high dose narcotics being given.   Will return on sept 10th for CBC with diff, cmet, and reticulocyte count per [facility] request.

## 2012-10-12 NOTE — Progress Notes (Signed)
  [facility] [name], MD Phone: [phone]  Subjective:  Hospital Follow up  # Sickle Cell Anemia with recent pain crisis Since hospitalization, patient with minimal to no pain in forearms and lower legs on chronic oxycontin 20mg  BID with percocet 10/325 q8 hour sprn which he also takes regularly. Patient is taking this scheduled to prevent pain. He went to [facility] for his sickle cell appointment and was given enough of this chronic medicine to bring him until today. Has appointment on October 16th with their pain clinic. No fever/chills. No shortness of breath. No worsening fatigue.   Social history-admits to selling drugs but is adamant that he takes all of his pain medicine himself. Recent urine drug screen in hospital was positive for cocaine  (which he denies and says "gets in through my pores") and negative for opiates.   ROS--See HPI  Past Medical History Patient Active Problem List   Diagnosis Date Noted  . Cocaine abuse 09/26/2012  . Acute-on-chronic kidney injury 09/26/2012  . Hyperkalemia 09/26/2012  . Systolic murmur [id number]  . Migraine 11/26/2009  . CHRONIC KIDNEY DISEASE STAGE II (MILD) 03/06/2009  . Sickle cell disease, type SS 06/18/2008  . TOBACCO ABUSE 05/22/2007   Reviewed problem list.  Medications- reviewed and updated Chief complaint-noted  Objective: BP 122/78  Pulse 118  Temp(Src) 98.9 F (37.2 C) (Oral)  Wt 120 lb (54.432 kg)  BMI 20.59 kg/m2 Gen: NAD, resting comfortably in chair CV: RRR 4/6 SEM at RUSB (baseline for patient) Lungs: CTAB no crackles, wheeze, rhonchi Skin: warm, dry Neuro: grossly normal, moves all extremities Ext: no edema, no pain with palpation  Assessment/Plan:  Tachycardia noted. Approximately 100 HR on my exam.

## 2012-11-16 ENCOUNTER — Other Ambulatory Visit: Payer: Self-pay | Admitting: Family Medicine

## 2012-11-16 MED ORDER — ENALAPRIL MALEATE 5 MG PO TABS: 5.0000 mg | ORAL_TABLET | Freq: Every day | ORAL | Status: DC

## 2012-11-29 ENCOUNTER — Other Ambulatory Visit: Payer: Self-pay | Admitting: Family Medicine

## 2012-11-29 MED ORDER — OXYCODONE HCL ER 20 MG PO T12A: 20.0000 mg | EXTENDED_RELEASE_TABLET | Freq: Two times a day (BID) | ORAL | Status: DC

## 2012-11-29 MED ORDER — OXYCODONE-ACETAMINOPHEN 10-325 MG PO TABS: 1.0000 | ORAL_TABLET | Freq: Three times a day (TID) | ORAL | Status: DC | PRN

## 2012-11-29 NOTE — Progress Notes (Signed)
Has been seen [facility] pain clinic for first appt and has f/u Nov 3 but out of med so I will fill one month only [name] [name]

## 2013-01-22 ENCOUNTER — Ambulatory Visit (INDEPENDENT_AMBULATORY_CARE_PROVIDER_SITE_OTHER): Payer: Medicare Other | Admitting: Family Medicine

## 2013-01-22 VITALS — BP 122/76 | HR 121 | Temp 98.4°F | Wt 132.0 lb

## 2013-01-22 DIAGNOSIS — D571 Sickle-cell disease without crisis: Secondary | ICD-10-CM

## 2013-01-22 LAB — RETICULOCYTES
ABS Retic: 173.9 10*3/uL (ref 19.0–186.0)
RBC.: 1.83 MIL/uL — ABNORMAL LOW (ref 4.22–5.81)

## 2013-01-22 LAB — COMPREHENSIVE METABOLIC PANEL
ALT: 29 U/L (ref 0–53)
CO2: 26 mEq/L (ref 19–32)
Chloride: 101 mEq/L (ref 96–112)
Potassium: 4.9 mEq/L (ref 3.5–5.3)
Sodium: 136 mEq/L (ref 135–145)
Total Bilirubin: 6 mg/dL — ABNORMAL HIGH (ref 0.3–1.2)
Total Protein: 6.3 g/dL (ref 6.0–8.3)

## 2013-01-22 LAB — CBC WITH DIFFERENTIAL/PLATELET
Eosinophils Absolute: 0.6 10*3/uL (ref 0.0–0.7)
Lymphocytes Relative: 45 % (ref 12–46)
Lymphs Abs: 5.5 10*3/uL — ABNORMAL HIGH (ref 0.7–4.0)
Neutro Abs: 4.9 10*3/uL (ref 1.7–7.7)
Neutrophils Relative %: 40 % — ABNORMAL LOW (ref 43–77)
Platelets: 246 10*3/uL (ref 150–400)
RBC: 1.83 MIL/uL — ABNORMAL LOW (ref 4.22–5.81)
WBC: 12.3 10*3/uL — ABNORMAL HIGH (ref 4.0–10.5)

## 2013-01-22 MED ORDER — OXYCODONE-ACETAMINOPHEN 10-325 MG PO TABS: 1.0000 | ORAL_TABLET | Freq: Three times a day (TID) | ORAL | Status: DC | PRN

## 2013-01-22 MED ORDER — OXYCODONE HCL ER 20 MG PO T12A: 20.0000 mg | EXTENDED_RELEASE_TABLET | Freq: Two times a day (BID) | ORAL | Status: DC

## 2013-01-22 MED ORDER — KETOROLAC TROMETHAMINE 60 MG/2ML IM SOLN
60.0000 mg | Freq: Once | INTRAMUSCULAR | Status: AC
  Administered 2013-01-22: 60 mg via INTRAMUSCULAR

## 2013-01-22 NOTE — Addendum Note (Signed)
Addended by: Marily Memos, Soni Kegel J on: 01/22/2013 11:02 AM   Modules accepted: Orders

## 2013-01-22 NOTE — Patient Instructions (Signed)
Thank you for coming in today We will give you enough percocet to make it to your appointment next Thursday at [facility] Please stay well hydrated and get lots of rest. Please come to the hospital if you get worse Sickle Cell Anemia Sickle cell anemia needs regular medical care by your caregiver and awareness by you when to seek medical care. Pain is a common problem in children with sickle cell disease. This usually starts at less than 1 year of age. Pain can occur nearly anywhere in the body, but most commonly happens in the extremities, back, chest, or belly (abdomen). Pain episodes can start suddenly or may follow an illness. These attacks can appear as decreased activity, loss of appetite, change in behavior, or simply complaints of pain. DIAGNOSIS   Specialized blood and gene testing can help make this diagnosis early in the disease. Blood tests may then be done to watch blood levels.  Specialized brain scans are done when there are problems in the brain during a crisis.  Lung testing may be done later in the disease. HOME CARE INSTRUCTIONS   Maintain good hydration. Increase your child's fluid intake in hot weather and during exercise.  Avoid smoking around your child. Smoking lowers the oxygen in the blood and can cause sickling.  Control pain. Only give your child over-the-counter or prescription medicines for pain, discomfort, or fever as directed by their caregiver. Do not give aspirin to children because of the association with Reye's syndrome.  Keep regular health care checks to keep a proper red blood cell (hemoglobin) level. A moderate anemia level protects against sickling crises.  You or your child should receive all the same immunizations and care as the people around them.  Moms should breastfeed their babies if possible. Use formulas with iron added if breastfeeding is not possible. Additional iron should not be given unless there is a lack of it. People with SCD build up iron  faster than normal. Give folic acid and additional vitamins as directed.  If you or your child has been prescribed antibiotics or other medications to prevent problems, give them as directed.  Summer camps are available for children with SCD. They may help young people deal with their disease. The camps introduce them to other children with the same condition.  Young people with SCD may become frustrated or angry at their disease. This can cause rebellion and refusal to follow medical care. Help groups or counseling may help with these problems.  Make sure your child wears a medical alert bracelet. When traveling, keep your medical information, caregiver's names, and the medications your child takes with you at all times. SEEK IMMEDIATE MEDICAL CARE IF:   You or your child feels dizzy or faint.  You or your child develops a new onset of abdominal pain, especially on the left side near the stomach area.  You or your child develops a persistent, often uncomfortable and painful penile erection. This is called priapism. Always check young boys for this. It is often embarrassing for them and they may not bring it to your attention. This is a medical emergency and needs immediate treatment. If this is not treated it will lead to impotence.  You or your child develops numbness in or has a hard time moving arms and legs.  You or your child has a hard time with speech.  You have a fever.  You or your child develops signs of infection (chills, lethargy, irritability, poor eating, vomiting). The younger the child,  the more you should be concerned.  With fevers, do not give medications to lower the fever right away. This could cover up a problem that is developing. Notify your caregiver.  You or your child develops pain that is not helped with medicine.  You or your child develops shortness of breath, or is coughing up pus-like or bloody sputum.  You or your child develops any problems that are  new and are causing you to worry. Document Released: 05/10/2005 Document Revised: 04/24/2011 Document Reviewed: 09/11/2012 [facility] Patient Information 2014 [place], [place].

## 2013-01-22 NOTE — Addendum Note (Signed)
Addended by: Marily Memos, DAVID J on: 01/22/2013 11:05 AM   Modules accepted: Orders

## 2013-01-22 NOTE — Addendum Note (Signed)
Addended by: Christen Bame D on: 01/22/2013 11:00 AM   Modules accepted: Orders

## 2013-01-22 NOTE — Assessment & Plan Note (Addendum)
Currently in crisis Discussed w/ Pt PCP Will give enough Oxy XR  to get pt to [facility] in 1 wk (14 pills) Pt states he was taking extra due to pain.   15 perc as still has 2 at home.  Morphine gives pt HA Toradol in office. Aware of renal fxn but pt requesting and states he gets this on a regular basis in the hospital. Pt ware of risks and benefits No signs of acute chest Pt to stay well hydrated and aware of reasons to return for possible admission.  UDS, CMEt, CBCw/diff, Retic.

## 2013-01-22 NOTE — Progress Notes (Addendum)
[name] [name]. is a 32 y.o. male who presents to [facility] today for leg pain  Leg and hand pain: typical [place] pain for pt. In Crisis per pt. Triggered by cold weather. Only 2 percocet left. Taking 20mg  Oxy 12hr Q12 to Q8. Taking perc 5/325 Q6. Not using any ibuprofen. Staying well hydrated. Gets toradol regularly in hospital for pain despite renal function per pt. Not smoking.  Pt [facility] [place] [facility] next [place].  Taking Hydroxyurea.  Approximately 2crisis per year.    The following portions of the patient's history were reviewed and updated as appropriate: allergies, current medications, past medical history, family and social history, and problem list.  Patient is a nonsmoker.   Past Medical History  Diagnosis Date  . Sickle cell anemia   . Sickle cell crisis 09/25/2012  . Bacterial pneumonia ~ 2012    "caught it here in the hospital" (09/25/2012)  . History of blood transfusion     "always related to sickle cell crisis" (09/25/2012)  . GERD (gastroesophageal reflux disease)     "after I eat alot of spicey foods" (09/25/2012)  . Migraines     "take RX qd to prevent them" (09/25/2012)  . Cyst of brain     "2 really small ones in the back of my head; inside; saw them w/MRI" (09/25/2012)  . Chronic kidney disease     "from my sickle cell" (09/25/2012)    ROS as above otherwise neg.    Medications reviewed. Current Outpatient Prescriptions  Medication Sig Dispense Refill  . enalapril (VASOTEC) 5 MG tablet Take 1 tablet (5 mg total) by mouth daily.  90 tablet  3  . hydroxyurea (HYDREA) 500 MG capsule Take 500 mg by mouth daily.      . nortriptyline (PAMELOR) 25 MG capsule Take 1 capsule (25 mg total) by mouth at bedtime.  30 capsule  11  . OxyCODONE (OXYCONTIN) 20 mg T12A 12 hr tablet Take 1 tablet (20 mg total) by mouth 2 (two) times daily. Do not fill before 11/11/12.  36 tablet  0  . oxyCODONE-acetaminophen (PERCOCET) 10-325 MG per tablet Take 1 tablet by mouth every 8 (eight)  hours as needed for pain. Do not fill before 11/11/12  21 tablet  0   No current facility-administered medications for this visit.    Exam:  BP 122/76  Pulse 121  Temp(Src) 98.4 F (36.9 C) (Oral)  Wt 132 lb (59.875 kg) Gen: Well NAD HEENT: EOMI,  MMM, mild scleral icterus.  Lungs: CTABL Nl WOB Heart: RRR no MRG Abd: NABS, NT, ND Exts: Non edematous BL  LE, warm and well perfused.  MSK: hands, bilat tibial pain on palpation.   No results found for this or any previous visit (from the past 72 hour(s)).  A/P (as seen in Problem list)  Sickle cell disease, type SS Currently in crisis Discussed w/ Pt PCP Will give enough Oxy XR  to get pt to [facility] in 1 wk (14 pills) Pt states he was taking extra due to pain.   No perc as still has 2 at home.  Morphine gives pt HA Toradol in office. Aware of renal fxn but pt requesting and states he gets this on a regular basis in the hospital. Pt ware of risks and benefits No signs of acute chest Pt to stay well hydrated and aware of reasons to return for possible admission.  UDS, CMEt, CBCw/diff, Retic.

## 2013-01-23 ENCOUNTER — Encounter (HOSPITAL_COMMUNITY): Payer: Self-pay | Admitting: Emergency Medicine

## 2013-01-23 ENCOUNTER — Other Ambulatory Visit: Payer: Self-pay

## 2013-01-23 ENCOUNTER — Inpatient Hospital Stay (HOSPITAL_COMMUNITY)
Admission: EM | Admit: 2013-01-23 | Discharge: 2013-01-26 | DRG: 812 | Disposition: A | Discharge status: Home / Self Care | Payer: Medicare Other | Attending: Family Medicine | Admitting: Family Medicine

## 2013-01-23 DIAGNOSIS — N179 Acute kidney failure, unspecified: Secondary | ICD-10-CM | POA: Diagnosis present

## 2013-01-23 DIAGNOSIS — Z87891 Personal history of nicotine dependence: Secondary | ICD-10-CM

## 2013-01-23 DIAGNOSIS — N182 Chronic kidney disease, stage 2 (mild): Secondary | ICD-10-CM | POA: Diagnosis present

## 2013-01-23 DIAGNOSIS — D5700 Hb-SS disease with crisis, unspecified: Principal | ICD-10-CM | POA: Diagnosis present

## 2013-01-23 DIAGNOSIS — E875 Hyperkalemia: Secondary | ICD-10-CM | POA: Diagnosis present

## 2013-01-23 DIAGNOSIS — D649 Anemia, unspecified: Secondary | ICD-10-CM

## 2013-01-23 DIAGNOSIS — Z79899 Other long term (current) drug therapy: Secondary | ICD-10-CM

## 2013-01-23 DIAGNOSIS — F1410 Cocaine abuse, uncomplicated: Secondary | ICD-10-CM | POA: Diagnosis present

## 2013-01-23 LAB — DRUG SCREEN, URINE
Amphetamine Screen, Ur: NEGATIVE
Barbiturate Quant, Ur: NEGATIVE
Benzodiazepines.: NEGATIVE
Creatinine,U: 99.74 mg/dL
Methadone: NEGATIVE
Opiates: NEGATIVE
Phencyclidine (PCP): NEGATIVE
Propoxyphene: NEGATIVE

## 2013-01-23 LAB — COMPREHENSIVE METABOLIC PANEL
AST: 46 U/L — ABNORMAL HIGH (ref 0–37)
Albumin: 4.1 g/dL (ref 3.5–5.2)
Alkaline Phosphatase: 128 U/L — ABNORMAL HIGH (ref 39–117)
BUN: 28 mg/dL — ABNORMAL HIGH (ref 6–23)
CO2: 22 mEq/L (ref 19–32)
Chloride: 105 mEq/L (ref 96–112)
Creatinine, Ser: 2.54 mg/dL — ABNORMAL HIGH (ref 0.50–1.35)
GFR calc non Af Amer: 32 mL/min — ABNORMAL LOW (ref >90)
Glucose, Bld: 81 mg/dL (ref 70–99)
Potassium: 5.8 mEq/L — ABNORMAL HIGH (ref 3.5–5.1)
Sodium: 138 mEq/L (ref 135–145)
Total Bilirubin: 5.5 mg/dL — ABNORMAL HIGH (ref 0.3–1.2)
Total Protein: 6.7 g/dL (ref 6.0–8.3)

## 2013-01-23 LAB — CBC WITH DIFFERENTIAL/PLATELET
Basophils Absolute: 0 10*3/uL (ref 0.0–0.1)
Eosinophils Relative: 6 % — ABNORMAL HIGH (ref 0–5)
HCT: 17.4 % — ABNORMAL LOW (ref 39.0–52.0)
Lymphocytes Relative: 48 % — ABNORMAL HIGH (ref 12–46)
MCH: 40.8 pg — ABNORMAL HIGH (ref 26.0–34.0)
MCHC: 36.8 g/dL — ABNORMAL HIGH (ref 30.0–36.0)
MCV: 110.8 fL — ABNORMAL HIGH (ref 78.0–100.0)
Monocytes Relative: 7 % (ref 3–12)
Neutro Abs: 6.2 10*3/uL (ref 1.7–7.7)
Neutrophils Relative %: 39 % — ABNORMAL LOW (ref 43–77)
Platelets: 186 10*3/uL (ref 150–400)
RBC: 1.57 MIL/uL — ABNORMAL LOW (ref 4.22–5.81)
RDW: 22.6 % — ABNORMAL HIGH (ref 11.5–15.5)
WBC: 15.8 10*3/uL — ABNORMAL HIGH (ref 4.0–10.5)

## 2013-01-23 LAB — SAMPLE TO BLOOD BANK

## 2013-01-23 LAB — RETICULOCYTES
Retic Count, Absolute: 168 10*3/uL (ref 19.0–186.0)
Retic Ct Pct: 10.7 % — ABNORMAL HIGH (ref 0.4–3.1)

## 2013-01-23 MED ORDER — HYDROMORPHONE HCL PF 1 MG/ML IJ SOLN
1.0000 mg | Freq: Once | INTRAMUSCULAR | Status: AC
  Administered 2013-01-23: 1 mg via INTRAVENOUS
  Filled 2013-01-23: qty 1

## 2013-01-23 MED ORDER — SODIUM CHLORIDE 0.9 % IV BOLUS (SEPSIS)
1000.0000 mL | Freq: Once | INTRAVENOUS | Status: AC
  Administered 2013-01-23: 1000 mL via INTRAVENOUS

## 2013-01-23 MED ORDER — HYDROMORPHONE HCL PF 1 MG/ML IJ SOLN
INTRAMUSCULAR | Status: AC
  Filled 2013-01-23: qty 1

## 2013-01-23 MED ORDER — DIPHENHYDRAMINE HCL 50 MG/ML IJ SOLN
25.0000 mg | Freq: Once | INTRAMUSCULAR | Status: AC
  Administered 2013-01-23: 25 mg via INTRAVENOUS
  Filled 2013-01-23: qty 1

## 2013-01-23 MED ORDER — HYDROMORPHONE HCL PF 1 MG/ML IJ SOLN
1.0000 mg | Freq: Once | INTRAMUSCULAR | Status: AC
  Administered 2013-01-23: 1 mg via INTRAVENOUS

## 2013-01-23 NOTE — ED Notes (Signed)
C/o pain, denies any sx other than pain, pain c/w crisis in past, pain usually in legs, now in legs feet and hands, onset Tuesday, gradually progressively worse since Tuesday, no recent illness or sickness, (denies: fever, nvd or dizziness), dilaudid and toradol usually help, pt states, "dilaudid just relaxes me, but does take care of the pain unless I get enough of it".  s.o.at BS.

## 2013-01-23 NOTE — ED Notes (Signed)
Patient having pain in legs, arms and feet.  Patient states that he went to family medicine clinic, received a shot yesterday, pain continues today.

## 2013-01-23 NOTE — ED Provider Notes (Signed)
CSN: [id number]     Arrival date & time 01/23/13  2025 History   First MD Initiated Contact with Patient 01/23/13 2043     Chief Complaint  Patient presents with  . Sickle Cell Pain Crisis   (Consider location/radiation/quality/duration/timing/severity/associated sxs/prior Treatment) HPI [name] [name]. is a 32 y.o. male who presented to the emergency department for concern of sickle cell pain.  Patient reports that this started 5 days ago.  Located in bilateral shins and hands.  Similar to numerous previous episodes.  No radiation.  No fevers.  No chest pain.  No SOB.  No fevers.  Pain sharp.  8-9/10 in severity.  No alleviating/aggravating factors.  No other symptoms.  Past Medical History  Diagnosis Date  . Sickle cell anemia   . Sickle cell crisis 09/25/2012  . Bacterial pneumonia ~ 2012    "caught it here in the hospital" (09/25/2012)  . History of blood transfusion     "always related to sickle cell crisis" (09/25/2012)  . GERD (gastroesophageal reflux disease)     "after I eat alot of spicey foods" (09/25/2012)  . Migraines     "take RX qd to prevent them" (09/25/2012)  . Cyst of brain     "2 really small ones in the back of my head; inside; saw them w/MRI" (09/25/2012)  . Chronic kidney disease     "from my sickle cell" (09/25/2012)   Past Surgical History  Procedure Laterality Date  . Cholecystectomy  ~ 2012   History reviewed. No pertinent family history. History  Substance Use Topics  . Smoking status: Former Smoker    Types: Cigarettes  . Smokeless tobacco: Never Used     Comment: 09/25/2012 "I don't buy cigarettes; bum one from friends q now and then"  . Alcohol Use: No    Review of Systems  Constitutional: Negative for fever and chills.  HENT: Negative for congestion and sore throat.   Respiratory: Negative for cough.   Gastrointestinal: Negative for nausea, vomiting, abdominal pain, diarrhea and constipation.  Endocrine: Negative for polyuria.   Genitourinary: Negative for dysuria and hematuria.  Musculoskeletal: Negative for neck pain.  Skin: Negative for rash.  Neurological: Negative for headaches.  Psychiatric/Behavioral: Negative.   All other systems reviewed and are negative.    Allergies  Morphine and related  Home Medications   Current Outpatient Rx  Name  Route  Sig  Dispense  Refill  . enalapril (VASOTEC) 5 MG tablet   Oral   Take 1 tablet (5 mg total) by mouth daily.   90 tablet   3   . hydroxyurea (HYDREA) 500 MG capsule   Oral   Take 500 mg by mouth daily.         . nortriptyline (PAMELOR) 25 MG capsule   Oral   Take 1 capsule (25 mg total) by mouth at bedtime.   30 capsule   11   . OxyCODONE (OXYCONTIN) 20 mg T12A 12 hr tablet   Oral   Take 1 tablet (20 mg total) by mouth 2 (two) times daily. Do not fill before 11/11/12.   14 tablet   0   . oxyCODONE-acetaminophen (PERCOCET) 10-325 MG per tablet   Oral   Take 1 tablet by mouth every 8 (eight) hours as needed for pain. Do not fill before 11/11/12   15 tablet   0    BP 127/76  Pulse 113  Temp(Src) 98.6 F (37 C) (Oral)  Resp 18  SpO2 93%  Physical Exam  Nursing note and vitals reviewed. Constitutional: He is oriented to person, place, and time. He appears well-developed and well-nourished. No distress.  HENT:  Head: Normocephalic and atraumatic.  Right Ear: External ear normal.  Left Ear: External ear normal.  Mouth/Throat: Oropharynx is clear and moist. No oropharyngeal exudate.  Eyes: Conjunctivae are normal. Pupils are equal, round, and reactive to light. Right eye exhibits no discharge.  Bilateral scleral icterus  Neck: Normal range of motion. Neck supple. No tracheal deviation present.  Cardiovascular: Normal rate, regular rhythm and intact distal pulses.   Pulmonary/Chest: Effort normal. No respiratory distress. He has no wheezes. He has no rales.  Abdominal: Soft. He exhibits no distension. There is no tenderness. There is no  rebound and no guarding.  Musculoskeletal: Normal range of motion.  Neurological: He is alert and oriented to person, place, and time.  Skin: Skin is warm and dry. No rash noted. He is not diaphoretic.  Psychiatric: He has a normal mood and affect.    ED Course  Procedures (including critical care time) Labs Review Labs Reviewed  CBC WITH DIFFERENTIAL - Abnormal; Notable for the following:    WBC 15.8 (*)    RBC 1.57 (*)    Hemoglobin 6.4 (*)    HCT 17.4 (*)    MCV 110.8 (*)    MCH 40.8 (*)    MCHC 36.8 (*)    RDW 22.6 (*)    Neutrophils Relative % 39 (*)    Lymphocytes Relative 48 (*)    Eosinophils Relative 6 (*)    Lymphs Abs 7.6 (*)    Monocytes Absolute 1.1 (*)    Eosinophils Absolute 0.9 (*)    All other components within normal limits  COMPREHENSIVE METABOLIC PANEL - Abnormal; Notable for the following:    Potassium 5.8 (*)    BUN 28 (*)    Creatinine, Ser 2.54 (*)    AST 46 (*)    Alkaline Phosphatase 128 (*)    Total Bilirubin 5.5 (*)    GFR calc non Af Amer 32 (*)    GFR calc Af Amer 37 (*)    All other components within normal limits  RETICULOCYTES - Abnormal; Notable for the following:    Retic Ct Pct 10.7 (*)    RBC. 1.57 (*)    All other components within normal limits  SAMPLE TO BLOOD BANK   Imaging Review No results found.  EKG Interpretation   None       MDM   1. Sickle cell pain crisis   2. Hyperkalemia   3. Anemia   4. Acute kidney injury   5. Hyperbilirubinemia     Ihor A Murdoch Heinle. is a 32 y.o. male with history of sickle cell disease who presents to the ED with a pain crisis.  Attempted to control pain here with 4 doses of dilaudid to no avail.  Incidentally noted to have Hg of 6.4 and mild hyperkalemia at 5.8.  No EKG changes.  Patient still with some pain.  Family practice consulted for admission.  Patient safe for admission to floor.  Patient admitted.    Rogelia Mire, MD 01/24/13 [phone]

## 2013-01-23 NOTE — ED Notes (Signed)
Pt using urinal, FPC MD in to room.

## 2013-01-24 ENCOUNTER — Encounter (HOSPITAL_COMMUNITY): Payer: Self-pay | Admitting: *Deleted

## 2013-01-24 DIAGNOSIS — N179 Acute kidney failure, unspecified: Secondary | ICD-10-CM

## 2013-01-24 DIAGNOSIS — D5700 Hb-SS disease with crisis, unspecified: Secondary | ICD-10-CM | POA: Diagnosis present

## 2013-01-24 LAB — CBC WITH DIFFERENTIAL/PLATELET
Basophils Relative: 0 % (ref 0–1)
Eosinophils Relative: 5 % (ref 0–5)
Hemoglobin: 5.3 g/dL — CL (ref 13.0–17.0)
MCH: 40.2 pg — ABNORMAL HIGH (ref 26.0–34.0)
Monocytes Relative: 7 % (ref 3–12)
Neutrophils Relative %: 37 % — ABNORMAL LOW (ref 43–77)
Platelets: 143 10*3/uL — ABNORMAL LOW (ref 150–400)
RBC: 1.32 MIL/uL — ABNORMAL LOW (ref 4.22–5.81)
WBC: 18.6 10*3/uL — ABNORMAL HIGH (ref 4.0–10.5)

## 2013-01-24 LAB — COMPREHENSIVE METABOLIC PANEL
ALT: 27 U/L (ref 0–53)
AST: 49 U/L — ABNORMAL HIGH (ref 0–37)
CO2: 18 mEq/L — ABNORMAL LOW (ref 19–32)
Calcium: 8.3 mg/dL — ABNORMAL LOW (ref 8.4–10.5)
Creatinine, Ser: 2.25 mg/dL — ABNORMAL HIGH (ref 0.50–1.35)
GFR calc Af Amer: 43 mL/min — ABNORMAL LOW (ref >90)
GFR calc non Af Amer: 37 mL/min — ABNORMAL LOW (ref >90)
Glucose, Bld: 90 mg/dL (ref 70–99)
Sodium: 138 mEq/L (ref 135–145)
Total Protein: 6.1 g/dL (ref 6.0–8.3)

## 2013-01-24 LAB — BASIC METABOLIC PANEL
BUN: 23 mg/dL (ref 6–23)
CO2: 17 mEq/L — ABNORMAL LOW (ref 19–32)
Calcium: 8.5 mg/dL (ref 8.4–10.5)
Chloride: 116 mEq/L — ABNORMAL HIGH (ref 96–112)
Creatinine, Ser: 2.11 mg/dL — ABNORMAL HIGH (ref 0.50–1.35)
GFR calc Af Amer: 47 mL/min — ABNORMAL LOW (ref >90)
Glucose, Bld: 91 mg/dL (ref 70–99)

## 2013-01-24 MED ORDER — NALOXONE HCL 0.4 MG/ML IJ SOLN: 0.4000 mg | INTRAMUSCULAR | Status: DC | PRN

## 2013-01-24 MED ORDER — ACETAMINOPHEN 325 MG PO TABS
650.0000 mg | ORAL_TABLET | ORAL | Status: DC | PRN
  Administered 2013-01-24 (×2): 650 mg via ORAL
  Filled 2013-01-24 (×2): qty 2

## 2013-01-24 MED ORDER — HYDROXYUREA 500 MG PO CAPS
500.0000 mg | ORAL_CAPSULE | Freq: Every day | ORAL | Status: DC
  Administered 2013-01-24 – 2013-01-26 (×3): 500 mg via ORAL
  Filled 2013-01-24 (×3): qty 1

## 2013-01-24 MED ORDER — HYDROMORPHONE 0.3 MG/ML IV SOLN
INTRAVENOUS | Status: DC
  Administered 2013-01-24 (×3): via INTRAVENOUS
  Administered 2013-01-25: 5 mg via INTRAVENOUS
  Administered 2013-01-25 (×2): via INTRAVENOUS
  Filled 2013-01-24 (×2): qty 25

## 2013-01-24 MED ORDER — DIPHENHYDRAMINE HCL 12.5 MG/5ML PO ELIX
12.5000 mg | ORAL_SOLUTION | Freq: Four times a day (QID) | ORAL | Status: DC | PRN
  Filled 2013-01-24: qty 5

## 2013-01-24 MED ORDER — ONDANSETRON HCL 4 MG/2ML IJ SOLN: 4.0000 mg | Freq: Four times a day (QID) | INTRAMUSCULAR | Status: DC | PRN

## 2013-01-24 MED ORDER — SODIUM POLYSTYRENE SULFONATE 15 GM/60ML PO SUSP
15.0000 g | Freq: Once | ORAL | Status: AC
  Administered 2013-01-24: 15 g via ORAL
  Filled 2013-01-24: qty 60

## 2013-01-24 MED ORDER — DIPHENHYDRAMINE HCL 50 MG/ML IJ SOLN: 12.5000 mg | Freq: Four times a day (QID) | INTRAMUSCULAR | Status: DC | PRN

## 2013-01-24 MED ORDER — WHITE PETROLATUM GEL
Status: AC
  Administered 2013-01-24: 0.2
  Filled 2013-01-24: qty 5

## 2013-01-24 MED ORDER — ACETAMINOPHEN 650 MG RE SUPP: 650.0000 mg | RECTAL | Status: DC | PRN

## 2013-01-24 MED ORDER — NORTRIPTYLINE HCL 25 MG PO CAPS
25.0000 mg | ORAL_CAPSULE | Freq: Every day | ORAL | Status: DC
Start: 2013-01-24 — End: 2013-01-26
  Administered 2013-01-24 – 2013-01-25 (×2): 25 mg via ORAL
  Filled 2013-01-24 (×4): qty 1

## 2013-01-24 MED ORDER — SODIUM CHLORIDE 0.9 % IV SOLN
INTRAVENOUS | Status: DC
  Administered 2013-01-24: 02:00:00 via INTRAVENOUS

## 2013-01-24 MED ORDER — FOLIC ACID 1 MG PO TABS
1.0000 mg | ORAL_TABLET | Freq: Every day | ORAL | Status: DC
  Administered 2013-01-25 – 2013-01-26 (×2): 1 mg via ORAL
  Filled 2013-01-24 (×4): qty 1

## 2013-01-24 MED ORDER — SODIUM CHLORIDE 0.45 % IV SOLN
INTRAVENOUS | Status: DC
  Administered 2013-01-24 – 2013-01-25 (×4): via INTRAVENOUS

## 2013-01-24 MED ORDER — SODIUM CHLORIDE 0.9 % IJ SOLN: 9.0000 mL | INTRAMUSCULAR | Status: DC | PRN

## 2013-01-24 MED ORDER — WHITE PETROLATUM GEL
Status: AC
  Administered 2013-01-24: 06:00:00
  Filled 2013-01-24: qty 5

## 2013-01-24 MED ORDER — DOCUSATE SODIUM 100 MG PO CAPS
100.0000 mg | ORAL_CAPSULE | Freq: Two times a day (BID) | ORAL | Status: DC
  Administered 2013-01-25 – 2013-01-26 (×3): 100 mg via ORAL
  Filled 2013-01-24 (×7): qty 1

## 2013-01-24 MED ORDER — HYDROMORPHONE 0.3 MG/ML IV SOLN
INTRAVENOUS | Status: DC
  Administered 2013-01-24: 02:00:00 via INTRAVENOUS
  Filled 2013-01-24: qty 25

## 2013-01-24 NOTE — Progress Notes (Signed)
Family Medicine Teaching Service Daily Progress Note Intern Pager: [phone]  Patient name: [name] [name]. Medical record number: [id number] Date of birth: [birth year] Age: 32 y.o. Gender: male  Primary Care Provider: [name], MD Consultants: none Code Status: full  Pt Overview and Major Events to Date:   Assessment and Plan: [name] [name]. is a 32 y.o. male presenting with sickle cell pain crisis . PMH is significant for sickle cell SS disease, CKD, and cocaine abuse.   # Sickle cell pain crisis: Attests to pain typical of crisis but different in location (rarely in legs). Cont to be afebrile but had bump in WBC to 18.6. Given nml pulm exam and o2 sats and no c/o CP, CXR not obtained. BCx drawn this morning prior to transfusion. Given hgb drop to 5.3 will transfuse 2 U. Pt reticing appropriately 10.7. States that dilaudid PCA not helping pain this morning. Total daily morphine equivalent 144mg . Does have hx of substance abuse. Cannot take toradol (2/2 CKD) would limitTylenol (2/2 hepatitis) -cont dilaudid PCA for pain control, consider alternative pain regimen if does not appear to improve pain after transfusion -will switch IVF to maintenance, do not want to fluid overload pt precipitating acute chest -monitor for signs and symptoms of acute chest - if this occurs will obtain CXR and start antibiotics, BCx pending now -continue home hydroxyurea  -tylenol for mild pain  -trend Hgb post transfusion - bowel regimen  # AKI on CKD: most likely due to a combination of depleted intravascular volume with his crisis and to the toradol he received yesterday in clinic. Cr 2.25, U/A with protein and hgb  -will continue to trend Cr especially in setting of IVF  -will likely benefit from outpatient referral to nephrology given decline in renal function   # Hyperkalemia: 5.8 X2, order for kayexelate, BMET to follow. No changes on EKG.  -cont tele -BMET to trend   FEN/GI:  regular diet, IVF 1/2NS@100  Prophylaxis: SCDs, no heparin given hgb 5.3  Disposition: pending improvement of sx, toleration of transfusion, transition to po regimen  Subjective: Still with bilat leg discomfort, no chest pain, SOB, palps  Objective: Temp:  [98.1 F (36.7 C)-98.6 F (37 C)] 98.1 F (36.7 C) (12/12 0512) Pulse Rate:  [101-121] 121 (12/12 0512) Resp:  [16-50] 20 (12/12 0512) BP: (101-127)/(62-83) 114/69 mmHg (12/12 0512) SpO2:  [90 %-98 %] 93 % (12/12 0512) FiO2 (%):  [38 %-39 %] 38 % (12/12 0303) Weight:  [133 lb 3 oz (60.413 kg)] 133 lb 3 oz (60.413 kg) (12/12 0200) Physical Exam: General: NAD, laying comfortably in bed  HEENT: NCAT, MMM, PERRL  Cardiovascular: tachycardic, no murmurs appreciated  Respiratory: CTAB, no wheezes or crackles  Abdomen: soft, NT, ND  Extremities: no edema noted, patient is non-tender to palpation over his fingers, feet, and shins bilaterally only when I ask him Skin: no lesions noted  Neuro: alert, no focal deficits   Laboratory:  Recent Labs Lab 01/22/13 1121 01/23/13 2041 01/24/13 0412  WBC 12.3* 15.8* 18.6*  HGB 7.5* 6.4* 5.3*  HCT 20.3* 17.4* 14.5*  PLT 246 186 143*    Recent Labs Lab 01/22/13 1121 01/23/13 2041 01/24/13 0412  NA 136 138 138  K 4.9 5.8* 5.8*  CL 101 105 110  CO2 26 22 18*  BUN 21 28* 25*  CREATININE 2.16* 2.54* 2.25*  CALCIUM 8.8 8.6 8.3*  PROT 6.3 6.7 6.1  BILITOT 6.0* 5.5* 5.2*  ALKPHOS 126* 128* 119*  ALT  29 29 27   AST 49* 46* 49*  GLUCOSE 83 81 90   U/a- with protein and hgb  Imaging/Diagnostic Tests: EKG: tachycardic, no ischemic changes, no peaked T waves   Langston Masker, MD 01/24/2013, 9:13 AM PGY-1, [address] Intern pager: [phone], text pages welcome

## 2013-01-24 NOTE — Progress Notes (Signed)
CRITICAL VALUE ALERT  Critical value received:  Results for [name] [name], [name] [name] (MRN [id number]) as of 01/24/2013 05:45  Ref. Range 01/24/2013 04:12  Hemoglobin Latest Range: 13.0-17.0 g/dL 5.3 (LL)    Date of notification:   01/24/2013   Time of notification:  [id number]  Critical value read back:yes  Nurse who received alert:  Viviano Simas   MD notified (1st page):  E. Sorinberg  Time of first page: 0545  MD notified (2nd page):  Time of second page:  Responding MD:  Young Berry  Time MD responded:  0600  No new order.

## 2013-01-24 NOTE — H&P (Signed)
FMTS Attending Admit Note Patient seen and examined by me, discussed with Dr Caryl Bis and I agree with his assessment and plan with following additions.  Patient presents with pain in hands, legs and feet that is similar in character to his usual vaso-occlusive crisis.  Denies fevers/chills, had a cold a month ago, denies recent cough, chest pain, n/v/d, or dysuria/polyuria. He reports that Dilaudid not helping with pain.  His Hgb has continued to drop to 5.3 this morning; review of past CBCs looks like his baseline Hgb is in the 7-8 range.  Worsening renal function and elevated K+ on admission.   A/P: Pain consistent with vaso-occlusive crisis, unclear trigger.  No findings or sx to suggest infectious cause.  Discussed with Dr Caryl Bis the plan to draw a blood cx before starting transfusion PRBCs; patient is amenable to this intervention if it will help his pain.  Continue to monitor while on Dilaudid PCA.  Of note, patient UDS positive for cocaine on multiple occasions including 01/22/13 office visit. Dalbert Mayotte, MD

## 2013-01-24 NOTE — Progress Notes (Signed)
Utilization review completed.  

## 2013-01-24 NOTE — Progress Notes (Signed)
I have seen and examined this patient. I have discussed with Dr Marsh.  I agree with their findings and plans as documented in their progress note.    

## 2013-01-24 NOTE — ED Provider Notes (Signed)
Medical screening examination/treatment/procedure(s) were conducted as a shared visit with resident-physician practitioner(s) and myself.  I personally evaluated the patient during the encounter.  Pt is a 32 y.o. male with pmhx as above presenting with SSPC.  On PE< pt uncomfortable, but in NAD.  Cardiopulm exam benign. Pt has significant scleral icterus.  Pt admitted to [facility] for further mgmt as pain unable to be controlled in dept.   EKG Interpretation    Date/Time:  Thursday January 23 2013 22:25:52 EST Ventricular Rate:  100 PR Interval:  164 QRS Duration: 81 QT Interval:  321 QTC Calculation: 414 R Axis:   74 Text Interpretation:  Sinus tachycardia Otherwise no significant change Confirmed by DOCHERTY  MD, MEGAN ([id number]) on 01/24/2013 1:38:47 PM               Neta Ehlers, MD 01/24/13 1339

## 2013-01-24 NOTE — Progress Notes (Signed)
PCP Social Visit Note [place]. Melanee Spry, MD, PGY-3  In Summary (see details below): Cocaine positive and opiate negative recently for 3rd time despite high dose narcotics Admitted previously to selling drugs (denied narcotics) We had already established no chronic pain medicine.  Now, will no longer give Rx for acute flares outside of hospital (can have IM in clinic or be in house) Patient will obtain pain medicine from [facility] or obtain a new primary care provider.  Patient desires new PCP within our clinic (asked for Dr. Marily Memos)  I have seen patient and discussed current hospitalization. I agree with the excellent care provided by the primary team of [place] and want to thank them for their continued efforts in caring for my patient.   I had a long discussion with patient regarding narcotic prescriptions from our clinic. Patient told Dr. Marily Memos on 12/10 that he had been taking 20mg  oxycontin q8-q12 hours as well as percocet every 6 hours. UDS was obtained and was negative for narcotics but positive for cocaine. This is the 3rd time the patient has been screened for opiates in a row that he has been negative for them. This is the 2nd time in a row he has been positive for cocaine. Patient has admitted to me in the past selling drugs but denies selling his narcotics.  Last visit I had with patient, I told him we are no longer prescribing chronic narcotics but would provide for him for flares.   I discussed with the patient that as a clinic, we would no longer be providing narcotic prescriptions for his flares either but that we would only treat with IM shots in office or IV in the hospital. He is to obtain any prescriptions from his pain management specialist at [facility] (he tells me he was also positive for drugs at the [facility] clinic (which would be at least 3x positive for cocaine) and had been reduced to tramadol which is  inconsistent with hist story about oxycontin's and percocets. This could explain his negative drug screen BUT this would still then mean patient  gave [place] false information regarding medicines he was on). Patient states he missed his Thursday appointment with [facility] and cannot get pain medicine until March from him. Patient states he used cocaine for his birthday. Last time, he stated it got in through his pores while drug dealing.   Patient asked that I no longer see him in the hospital (I am on call on Sunday). He tells me that i "always have something to say' and "always want to put in my 2 cents" and asks me not to communicate with the hospital team. He states the above is the reason he would no longer like for me to be his PCP. Requests to not see me when he leaves the hospital and he will place PCP change form. I informed him this would be our policy regardless of PCP as I have already discussed this case with Dr. Sharion Settler, MD, PGY3 01/24/2013 4:59 PM

## 2013-01-24 NOTE — H&P (Signed)
[facility] Admission History and Physical Service Pager: [phone]  Patient name: [name] [name]. Medical record number: [id number] Date of birth: [birth year] Age: 32 y.o. Gender: male  Primary Care Provider: [name], MD Consultants: none Code Status: full  Chief Complaint: finger, foot, and leg pain  Assessment and Plan: [name] Hawksley. is a 32 y.o. male presenting with sickle cell pain crisis . PMH is significant for sickle cell SS disease, CKD, and cocaine abuse.  # Sickle cell pain crisis: patient presents with pain typical of pain crisis, though in slightly different locations. Note hgb to 6.4 and retic to 10.7. Patient is afebrile with normal O2 sats and normal pulmonary exam making acute chest unlikely at this time. -will admit to tele, attending Dr Lindell Noe -will start on dilaudid PCA to help control pain - unfortunately given the patients worsened renal function today I will not be able to give him toradol without further damaging his kidneys -will start IVF NS @ 200 mL/hr -monitor for signs and symptoms of acute chest - if this occurs will obtain CXR, blood cultures, and start antibiotics -continue home hydroxyurea -tylenol for mild pain -trend Hgb - will need to consider transfusion   # AKI on CKD: most likely due to a combination of depleted intravascular volume with his crisis and to the toradol he received yesterday in clinic. -will continue to trend Cr especially in setting of IVF -obtain UA -will likely benefit from outpatient referral to nephrology given decline in renal function  # Hyperkalemia: 5.8 on check today. No changes on EKG. -will place on tele -will trend this - if still elevated will give kayexelate  FEN/GI: regular diet, IVF NS 200 mL/hr Prophylaxis: SCDs, no heparin given hgb 6.4  Disposition: admit to tele, discharge pending control of pain on PO pain medications  History of Present Illness: [name] Hathorn. is a 32 y.o. male with a history of sickle cell SS disease presenting with sickle cell pain crisis. Patient notes 2 days ago he developed pain in his feet. This progressed to pain in his shins and fingers. He notes taking his home medications, oxycontin and percocet, without much benefit. He was seen at [facility] 12/10 for this issue and was given refills of oxy XR and percocet to cover his pain until his appointment at [facility] in one week. He was additionally given toradol at that time as the patient requested it and stated was aware of the risks with his renal issues. Note UDS positive for cocaine yesterday. The patient denies fever, dyspnea, and chest pain. States his usual sickle cell pain is in his back and legs. Notes he takes hydroxyurea daily. He follows with Dr Sharlet Salina at [facility] sickle cell clinic.  He notes the dilaudid 1 mg x4 doses he received in the ED helped him relax though did not improve his pain much. He also received a NS 1 L bolus Note patients potassium was 5.8 with no EKG changes seen. Hgb to 6.4 and retic to 10.7. He was not transfused in the ED.  Review Of Systems: Per HPI with the following additions: none Otherwise 12 point review of systems was performed and was unremarkable.  Patient Active Problem List   Diagnosis Date Noted  . Cocaine abuse 09/26/2012  . Acute-on-chronic kidney injury 09/26/2012  . Hyperkalemia 09/26/2012  . Systolic murmur [id number]  . Migraine 11/26/2009  . CHRONIC KIDNEY DISEASE STAGE II (MILD) 03/06/2009  . Sickle cell disease, type  SS 06/18/2008  . TOBACCO ABUSE 05/22/2007   Past Medical History: Past Medical History  Diagnosis Date  . Sickle cell anemia   . Sickle cell crisis 09/25/2012  . Bacterial pneumonia ~ 2012    "caught it here in the hospital" (09/25/2012)  . History of blood transfusion     "always related to sickle cell crisis" (09/25/2012)  . GERD (gastroesophageal reflux disease)     "after I eat alot of spicey foods"  (09/25/2012)  . Migraines     "take RX qd to prevent them" (09/25/2012)  . Cyst of brain     "2 really small ones in the back of my head; inside; saw them w/MRI" (09/25/2012)  . Chronic kidney disease     "from my sickle cell" (09/25/2012)   Past Surgical History: Past Surgical History  Procedure Laterality Date  . Cholecystectomy  ~ 2012   Social History: History  Substance Use Topics  . Smoking status: Former Smoker    Types: Cigarettes  . Smokeless tobacco: Never Used     Comment: 09/25/2012 "I don't buy cigarettes; bum one from friends q now and then"  . Alcohol Use: No   Additional social history: none  Please also refer to relevant sections of EMR.  Family History: History reviewed. No pertinent family history. Allergies and Medications: Allergies  Allergen Reactions  . Morphine And Related Other (See Comments)    "real bad headaches"   No current facility-administered medications on file prior to encounter.   Current Outpatient Prescriptions on File Prior to Encounter  Medication Sig Dispense Refill  . enalapril (VASOTEC) 5 MG tablet Take 1 tablet (5 mg total) by mouth daily.  90 tablet  3  . hydroxyurea (HYDREA) 500 MG capsule Take 500 mg by mouth daily.      . nortriptyline (PAMELOR) 25 MG capsule Take 1 capsule (25 mg total) by mouth at bedtime.  30 capsule  11  . OxyCODONE (OXYCONTIN) 20 mg T12A 12 hr tablet Take 1 tablet (20 mg total) by mouth 2 (two) times daily. Do not fill before 11/11/12.  14 tablet  0  . oxyCODONE-acetaminophen (PERCOCET) 10-325 MG per tablet Take 1 tablet by mouth every 8 (eight) hours as needed for pain. Do not fill before 11/11/12  15 tablet  0    Objective: BP 117/83  Pulse 106  Temp(Src) 98.6 F (37 C) (Oral)  Resp 18  SpO2 93% Exam: General: NAD, laying comfortably in bed HEENT: NCAT, MMM, PERRL Cardiovascular: tachycardic, no murmurs appreciated Respiratory: CTAB, no wheezes or crackles Abdomen: soft, NT, ND Extremities: no  edema noted, patient is tender to palpation over his fingers, feet, and shins bilaterally Skin: no lesions noted Neuro: alert, no focal deficits  Labs and Imaging: CBC BMET   Recent Labs Lab 01/23/13 2041  WBC 15.8*  HGB 6.4*  HCT 17.4*  PLT 186    Recent Labs Lab 01/23/13 2041  NA 138  K 5.8*  CL 105  CO2 22  BUN 28*  CREATININE 2.54*  GLUCOSE 81  CALCIUM 8.6     Results for orders placed during the hospital encounter of 01/23/13 (from the past 24 hour(s))  CBC WITH DIFFERENTIAL     Status: Abnormal   Collection Time    01/23/13  8:41 PM      Result Value Range   WBC 15.8 (*) 4.0 - 10.5 K/uL   RBC 1.57 (*) 4.22 - 5.81 MIL/uL   Hemoglobin 6.4 (*) 13.0 - 17.0  g/dL   HCT 17.4 (*) 39.0 - 52.0 %   MCV 110.8 (*) 78.0 - 100.0 fL   MCH 40.8 (*) 26.0 - 34.0 pg   MCHC 36.8 (*) 30.0 - 36.0 g/dL   RDW 22.6 (*) 11.5 - 15.5 %   Platelets 186  150 - 400 K/uL   Neutrophils Relative % 39 (*) 43 - 77 %   Lymphocytes Relative 48 (*) 12 - 46 %   Monocytes Relative 7  3 - 12 %   Eosinophils Relative 6 (*) 0 - 5 %   Basophils Relative 0  0 - 1 %   Neutro Abs 6.2  1.7 - 7.7 K/uL   Lymphs Abs 7.6 (*) 0.7 - 4.0 K/uL   Monocytes Absolute 1.1 (*) 0.1 - 1.0 K/uL   Eosinophils Absolute 0.9 (*) 0.0 - 0.7 K/uL   Basophils Absolute 0.0  0.0 - 0.1 K/uL   RBC Morphology POLYCHROMASIA PRESENT     WBC Morphology ABSOLUTE LYMPHOCYTOSIS    COMPREHENSIVE METABOLIC PANEL     Status: Abnormal   Collection Time    01/23/13  8:41 PM      Result Value Range   Sodium 138  135 - 145 mEq/L   Potassium 5.8 (*) 3.5 - 5.1 mEq/L   Chloride 105  96 - 112 mEq/L   CO2 22  19 - 32 mEq/L   Glucose, Bld 81  70 - 99 mg/dL   BUN 28 (*) 6 - 23 mg/dL   Creatinine, Ser 2.54 (*) 0.50 - 1.35 mg/dL   Calcium 8.6  8.4 - 10.5 mg/dL   Total Protein 6.7  6.0 - 8.3 g/dL   Albumin 4.1  3.5 - 5.2 g/dL   AST 46 (*) 0 - 37 U/L   ALT 29  0 - 53 U/L   Alkaline Phosphatase 128 (*) 39 - 117 U/L   Total Bilirubin 5.5 (*)  0.3 - 1.2 mg/dL   GFR calc non Af Amer 32 (*) >90 mL/min   GFR calc Af Amer 37 (*) >90 mL/min  RETICULOCYTES     Status: Abnormal   Collection Time    01/23/13  8:41 PM      Result Value Range   Retic Ct Pct 10.7 (*) 0.4 - 3.1 %   RBC. 1.57 (*) 4.22 - 5.81 MIL/uL   Retic Count, Manual 168.0  19.0 - 186.0 K/uL  SAMPLE TO BLOOD BANK     Status: None   Collection Time    01/23/13 10:00 PM      Result Value Range   Blood Bank Specimen SAMPLE AVAILABLE FOR TESTING     Sample Expiration 01/24/2013     EKG: tachycardic, no ischemic changes, no peaked T waves  Leone Haven, MD 01/24/2013, 12:10 AM PGY-2, [place] Intern pager: [phone], text pages welcome

## 2013-01-25 ENCOUNTER — Inpatient Hospital Stay (HOSPITAL_COMMUNITY): Payer: Medicare Other

## 2013-01-25 LAB — BASIC METABOLIC PANEL WITH GFR
BUN: 21 mg/dL (ref 6–23)
CO2: 16 meq/L — ABNORMAL LOW (ref 19–32)
Calcium: 8.4 mg/dL (ref 8.4–10.5)
Chloride: 112 meq/L (ref 96–112)
Creatinine, Ser: 1.87 mg/dL — ABNORMAL HIGH (ref 0.50–1.35)
GFR calc Af Amer: 54 mL/min — ABNORMAL LOW (ref >90)
GFR calc non Af Amer: 47 mL/min — ABNORMAL LOW (ref >90)
Glucose, Bld: 105 mg/dL — ABNORMAL HIGH (ref 70–99)
Potassium: 5.7 meq/L — ABNORMAL HIGH (ref 3.5–5.1)
Sodium: 138 meq/L (ref 135–145)

## 2013-01-25 LAB — TYPE AND SCREEN
Antibody Screen: NEGATIVE
Donor AG Type: NEGATIVE
Unit division: 0
Unit division: 0

## 2013-01-25 LAB — HEMOGLOBIN AND HEMATOCRIT, BLOOD
HCT: 22.9 % — ABNORMAL LOW (ref 39.0–52.0)
Hemoglobin: 8.5 g/dL — ABNORMAL LOW (ref 13.0–17.0)

## 2013-01-25 MED ORDER — HYDROMORPHONE HCL PF 1 MG/ML IJ SOLN
1.0000 mg | INTRAMUSCULAR | Status: DC | PRN
  Administered 2013-01-25: 1 mg via INTRAVENOUS
  Filled 2013-01-25: qty 1

## 2013-01-25 MED ORDER — SODIUM POLYSTYRENE SULFONATE 15 GM/60ML PO SUSP
30.0000 g | Freq: Two times a day (BID) | ORAL | Status: AC
  Administered 2013-01-25 (×2): 30 g via ORAL
  Filled 2013-01-25 (×2): qty 120

## 2013-01-25 MED ORDER — HYDROMORPHONE HCL PF 1 MG/ML IJ SOLN: 1.0000 mg | INTRAMUSCULAR | Status: DC | PRN

## 2013-01-25 MED ORDER — HYDROMORPHONE HCL PF 1 MG/ML IJ SOLN
1.0000 mg | INTRAMUSCULAR | Status: DC | PRN
  Administered 2013-01-25 – 2013-01-26 (×4): 1 mg via INTRAVENOUS
  Filled 2013-01-25 (×4): qty 1

## 2013-01-25 MED ORDER — DEXAMETHASONE SODIUM PHOSPHATE 4 MG/ML IJ SOLN
4.0000 mg | Freq: Three times a day (TID) | INTRAMUSCULAR | Status: DC
  Administered 2013-01-25 – 2013-01-26 (×2): 4 mg via INTRAVENOUS
  Filled 2013-01-25 (×7): qty 1

## 2013-01-25 NOTE — Progress Notes (Signed)
Family Medicine Teaching Service Daily Progress Note Intern Pager: [phone]  Patient name: [name] [name]. Medical record number: [id number] Date of birth: 04-18-1980 Age: 32 y.o. Gender: male  Primary Care Provider: [name], MD Consultants: none Code Status: full  Pt Overview and Major Events to Date:  12/12 - Admitted 12/13 - PCA discontinued  Assessment and Plan: [name] [name]. is a 32 y.o. male presenting with sickle cell pain crisis . PMH is significant for sickle cell SS disease, CKD, and cocaine abuse.   # Sickle cell pain crisis - Patient continues to report pain. S/P Transfusion on 12/12 (Hb 5.3 to 8.5) - Per MAR, patient has not used PCA.  Will switch to Dilaudid Q4PRN.  He states that the dilaudid does not help much.  He would like Toradol but his renal function prevents this.  Will consider PO Prednisone.  - IV fluids decreased to 75 mL/hr - Crackles noted on exam today; Chest xray not done. Will obtain today to rule out Acute chest - continue home hydroxyurea  - bowel regimen  # AKI on CKD: most likely due to a combination of depleted intravascular volume with his crisis and to the toradol he received yesterday in clinic -Creatinine improving. Will continue to trend. -will likely benefit from outpatient referral to nephrology given decline in renal function   # Hyperkalemia:  -K - 5.7 today. - Will continue to monitor via daily BMP   FEN/GI: regular diet, IVF 1/2NS@75  Prophylaxis: SCDs  Disposition: pending clinical improvement  Subjective:  Still having bilateral leg pain. No reports of chest pain or SOB. Reports Dilaudid is not helping pain.  Objective: Temp:  [97.7 F (36.5 C)-99.3 F (37.4 C)] 99.3 F (37.4 C) (12/13 0427) Pulse Rate:  [98-124] 109 (12/13 0427) Resp:  [13-22] 19 (12/13 0716) BP: (96-149)/(60-83) 110/69 mmHg (12/13 0427) SpO2:  [89 %-100 %] 93 % (12/13 0716) FiO2 (%):  [34 %-39 %] 34 % (12/13 0716) Weight:   [133 lb 3 oz (60.413 kg)] 133 lb 3 oz (60.413 kg) (12/12 2205) Physical Exam: General: NAD, laying comfortably in bed  Cardiovascular: Tachy. Regular Rhythm. 2/6 systolic murmur. Respiratory: Crackles noted at the bases. Abdomen: soft, nontender, nondistended.  Extremities: No LE edema. LE not tender to palpation.  Neuro: alert, no focal deficits  Laboratory:  Recent Labs Lab 01/22/13 1121 01/23/13 2041 01/24/13 0412  WBC 12.3* 15.8* 18.6*  HGB 7.5* 6.4* 5.3*  HCT 20.3* 17.4* 14.5*  PLT 246 186 143*    Recent Labs Lab 01/22/13 1121 01/23/13 2041 01/24/13 0412 01/24/13 1140  NA 136 138 138 142  K 4.9 5.8* 5.8* 6.0*  CL 101 105 110 116*  CO2 26 22 18* 17*  BUN 21 28* 25* 23  CREATININE 2.16* 2.54* 2.25* 2.11*  CALCIUM 8.8 8.6 8.3* 8.5  PROT 6.3 6.7 6.1  --   BILITOT 6.0* 5.5* 5.2*  --   ALKPHOS 126* 128* 119*  --   ALT 29 29 27   --   AST 49* 46* 49*  --   GLUCOSE 83 81 90 91    Imaging/Diagnostic Tests: EKG: tachycardic, no ischemic changes, no peaked T waves   Coral Spikes, DO 01/25/2013, 7:33 AM PGY-2, [place] Intern pager: [phone], text pages welcome

## 2013-01-26 LAB — BASIC METABOLIC PANEL
BUN: 27 mg/dL — ABNORMAL HIGH (ref 6–23)
CO2: 16 mEq/L — ABNORMAL LOW (ref 19–32)
Calcium: 8.1 mg/dL — ABNORMAL LOW (ref 8.4–10.5)
Chloride: 109 mEq/L (ref 96–112)
Creatinine, Ser: 1.77 mg/dL — ABNORMAL HIGH (ref 0.50–1.35)
GFR calc Af Amer: 58 mL/min — ABNORMAL LOW (ref >90)
GFR calc non Af Amer: 50 mL/min — ABNORMAL LOW (ref >90)
Glucose, Bld: 131 mg/dL — ABNORMAL HIGH (ref 70–99)
Potassium: 5.4 mEq/L — ABNORMAL HIGH (ref 3.5–5.1)
Sodium: 136 mEq/L (ref 135–145)

## 2013-01-26 LAB — CBC
HCT: 23.3 % — ABNORMAL LOW (ref 39.0–52.0)
MCH: 35 pg — ABNORMAL HIGH (ref 26.0–34.0)
MCHC: 36.9 g/dL — ABNORMAL HIGH (ref 30.0–36.0)
MCV: 94.7 fL (ref 78.0–100.0)
Platelets: 167 10*3/uL (ref 150–400)
RDW: 26.2 % — ABNORMAL HIGH (ref 11.5–15.5)
WBC: 7.5 10*3/uL (ref 4.0–10.5)

## 2013-01-26 MED ORDER — FOLIC ACID 1 MG PO TABS: 1.0000 mg | ORAL_TABLET | Freq: Every day | ORAL | Status: DC

## 2013-01-26 MED ORDER — SODIUM CHLORIDE 0.45 % IV SOLN
INTRAVENOUS | Status: DC
  Administered 2013-01-26: 10 mL/h via INTRAVENOUS

## 2013-01-26 MED ORDER — HYDROMORPHONE HCL PF 1 MG/ML IJ SOLN
1.0000 mg | Freq: Once | INTRAMUSCULAR | Status: AC
  Administered 2013-01-26: 1 mg via INTRAVENOUS
  Filled 2013-01-26: qty 1

## 2013-01-26 MED ORDER — OXYCODONE-ACETAMINOPHEN 5-325 MG PO TABS
2.0000 | ORAL_TABLET | Freq: Four times a day (QID) | ORAL | Status: DC | PRN
  Administered 2013-01-26: 2 via ORAL
  Filled 2013-01-26: qty 2

## 2013-01-26 NOTE — Progress Notes (Signed)
Family Medicine Teaching Service Daily Progress Note Intern Pager: [phone]  Patient name: [name] [name]. Medical record number: [id number] Date of birth: 25-Jan-1981 Age: 32 y.o. Gender: male  Primary Care Provider: [name], MD Consultants: none Code Status: full  Pt Overview and Major Events to Date:  12/12 - Admitted 12/13 - PCA discontinued  Assessment and Plan: Shi Bayerl. is a 32 y.o. male presenting with sickle cell pain crisis . PMH is significant for sickle cell SS disease, CKD, and cocaine abuse.   # Sickle cell pain crisis: Patient continues to report pain. S/P Transfusion on 12/12 (Hb 5.3 to 8.5) - switched to dilaudid q2prn yesterday (despite fact that pt states that the dilaudid does not help much). Daily eqv dose morphine 100mg . He would like Toradol but his renal function prevents this. Given PO prednisone with minimal response per pt report. Would like to transition to PO pain regimen today. Percocet 10s q6 - KVO IVF - CXR yesterday- no infiltrate or acute chest - continue home hydroxyurea  - bowel regimen  # AKI on CKD: most likely due to a combination of depleted intravascular volume with his crisis and to the toradol he received -Creatinine improving. Today 1.77. Will continue to trend. -will likely benefit from outpatient referral to nephrology given decline in renal function   # Hyperkalemia: likely 2/2 kidney dx and hemolysis -K - 5.7 yesterday, 5.4 today - Will continue to monitor via daily BMP   FEN/GI: regular diet, IVF KVO Prophylaxis: SCDs  Disposition: pending clinical improvement, transition to PO pain meds (will not send home with rx per Dr. Yong Channel request)  Subjective: Feels that nothing is really helping him with pain control, amenable to switching to PO meds   Objective: Temp:  [98.4 F (36.9 C)-98.9 F (37.2 C)] 98.4 F (36.9 C) (12/14 0445) Pulse Rate:  [94-111] 104 (12/14 0445) Resp:  [18-20] 18 (12/14  0445) BP: (120-124)/(68-80) 120/70 mmHg (12/14 0445) SpO2:  [90 %-95 %] 93 % (12/14 0445) FiO2 (%):  [34 %] 34 % (12/13 0716) Weight:  [137 lb 11.2 oz (62.46 kg)] 137 lb 11.2 oz (62.46 kg) (12/13 2040) Physical Exam: General: NAD, laying comfortably in bed  Cardiovascular: Tachy. Regular Rhythm. 2/6 systolic murmur. Respiratory: Crackles noted at the bases. Abdomen: soft, nontender, nondistended.  Extremities: No LE edema. LE not tender to palpation.  Neuro: alert, no focal deficits  Laboratory:  Recent Labs Lab 01/22/13 1121 01/23/13 2041 01/24/13 0412 01/25/13 0825  WBC 12.3* 15.8* 18.6*  --   HGB 7.5* 6.4* 5.3* 8.5*  HCT 20.3* 17.4* 14.5* 22.9*  PLT 246 186 143*  --     Recent Labs Lab 01/22/13 1121  01/23/13 2041 01/24/13 0412 01/24/13 1140 01/25/13 0825  NA 136  --  138 138 142 138  K 4.9  --  5.8* 5.8* 6.0* 5.7*  CL 101  --  105 110 116* 112  CO2 26  --  22 18* 17* 16*  BUN 21  --  28* 25* 23 21  CREATININE 2.16*  < > 2.54* 2.25* 2.11* 1.87*  CALCIUM 8.8  --  8.6 8.3* 8.5 8.4  PROT 6.3  --  6.7 6.1  --   --   BILITOT 6.0*  --  5.5* 5.2*  --   --   ALKPHOS 126*  --  128* 119*  --   --   ALT 29  --  29 27  --   --   AST  49*  --  46* 49*  --   --   GLUCOSE 83  --  81 90 91 105*  < > = values in this interval not displayed.  Imaging/Diagnostic Tests: EKG: tachycardic, no ischemic changes, no peaked T waves   Langston Masker, MD 01/26/2013, 7:10 AM PGY-1, [place] Intern pager: [phone], text pages welcome

## 2013-01-26 NOTE — Discharge Summary (Signed)
[facility] Discharge Summary  Patient name: [name] [name]. Medical record number: [id number] Date of birth: February 26, 1980 Age: 32 y.o. Gender: male Date of Admission: 01/23/2013  Date of Discharge: 01/26/13 Admitting Physician: Willeen Niece, MD  Primary Care Provider: [name], MD Consultants: None  Indication for Hospitalization: pain crisis (fingers and bilateral legs)   Discharge Diagnoses/Problem List:  Sickle cell pain crisis AKI on CKD Hyperkalemia  Disposition: home  Discharge Condition: stable  Discharge Exam:  BP 126/66  Pulse 100  Temp(Src) 98.3 F (36.8 C) (Oral)  Resp 18  Ht 5\' 4"  (1.626 m)  Wt 137 lb 11.2 oz (62.46 kg)  BMI 23.62 kg/m2  SpO2 94% General: NAD, laying comfortably in bed  Cardiovascular: Tachy. Regular Rhythm. 2/6 systolic murmur.  Respiratory: Crackles noted at the bases.  Abdomen: soft, nontender, nondistended.  Extremities: No LE edema. LE not tender to palpation.  Neuro: alert, no focal deficits  Brief Hospital Course:  [name] [name]. is a 32 y.o. male presenting with sickle cell pain crisis . PMH is significant for sickle cell SS disease, CKD, and cocaine abuse.   # Sickle cell pain crisis: Patient presents with pain typical of pain crisis, though in slightly different locations. 2 days ago noted pain developing in feet which progressed to include shins and fingers. He took home pain medications including oxycontin and perc with minimal benefit. He was seen at [facility] 12/10 and given refills for oxy and perc to cover pain until could f/up at [name] in 1 week. Additionally given IM injection of toradol (pt acknowledged renal risks). Presented to the ED with continued pain where he was given dilaudid 1mg  X4 doses with some relaxation but did not improve pain much. Also s/p 1L NS bolus. K notable for 5.8. EKG without ischemic changes. Hgb 6.4 with retic 10.7. Afebrile no WBC normal o2 sat and pulm exam  making acute chest unlikely. Pt was started on dilaudid PCA and transfused 2 U of prbcs with good response hgb 5.3-> 8.5 Pt initially given IVF but was d/c'd to avoid fluid overload and precipitation of acute chest. Dexamethasone was added for pain control as pt continued to state that PCA dilaudid not helping. Pain regimen then switched to IV dilaudid q2 per pt request. CXR obtained 12/13 given Crackles on exam. No evidence of infiltrate. Pt transitioned to PO percocet q6 (increased frequency from home regimen) Was still requesting dilaudid despite the fact that he had stated earlier that it had not helped pain at all. Pt eventually amenable to d/c because he felt that pain not improving in house. Hgb noted to be 8.6 on day of d/c. Pt not given rx for narcotic given 3rd post drug screen and concerns that he has been selling his meds (see below). Will arrange post hosp f/up in 1 week.   # AKI on CKD: Most likely due to a combination of depleted intravascular volume with his crisis and to the toradol he received in clinic day prior to admission. Cr 2.54 on presentation. Pt given IVF and Cr trended with BMETs. NSAIDs avoided in house 2/2 renal function (despite multiple pt requests) GFR 58.Cr on d/c improved to 1.77. Would f/up BMET closely as outpt and consider renal referral. (Also noted to have Calcium of 8.1 on d/c, further evidence of worsening renal fxn)  # Hyperkalemia: Was noted to have hyperkalemia on admission (5.8) but also per review has year long hx of elevated potassium 4.5-5.5 likely  2/2 kidney dx and hemolysis. Pt was given kayexalate and IVF and Cr were trended. Noted to be 5.4 on d/c. Would likely benefit from outpt referral to nephrology given decline in renal fxn (see above)   #Substance abuse: Pt cocaine positive and opiate neg recently for 3rd time despite rx for high dose narcs. Discussed extensively with PCP Dr. Yong Channel who has communicated to pt that he will need to obtain pain medication  from [facility] or a new PCP. Pt will not be eligible for Rx for acute flares outside the hospital (can be given IM in clinic or during admission). Pt not sent home with Rx for chronic pain management per these instructions.  Issues for Follow Up:  1. Creatinine  2. Hyperkalemia- trend BMET and consider need to see Renal  3. Pain management- f/up at [facility]  Significant Procedures: None  Significant Labs and Imaging:   Recent Labs Lab 01/23/13 2041 01/24/13 0412 01/25/13 0825 01/26/13 0610  WBC 15.8* 18.6*  --  7.5  HGB 6.4* 5.3* 8.5* 8.6*  HCT 17.4* 14.5* 22.9* 23.3*  PLT 186 143*  --  167    Recent Labs Lab 01/22/13 1121 01/23/13 2041 01/24/13 0412 01/24/13 1140 01/25/13 0825 01/26/13 0610  NA 136 138 138 142 138 136  K 4.9 5.8* 5.8* 6.0* 5.7* 5.4*  CL 101 105 110 116* 112 109  CO2 26 22 18* 17* 16* 16*  GLUCOSE 83 81 90 91 105* 131*  BUN 21 28* 25* 23 21 27*  CREATININE 2.16* 2.54* 2.25* 2.11* 1.87* 1.77*  CALCIUM 8.8 8.6 8.3* 8.5 8.4 8.1*  MG  --   --  2.2  --   --   --   ALKPHOS 126* 128* 119*  --   --   --   AST 49* 46* 49*  --   --   --   ALT 29 29 27   --   --   --   ALBUMIN 4.4 4.1 3.6  --   --   --    EKG: tachycardic, no ischemic changes, no peaked T waves  Results/Tests Pending at Time of Discharge: None  Discharge Medications:    Medication List    STOP taking these medications       enalapril 5 MG tablet  Commonly known as:  VASOTEC      TAKE these medications       folic acid 1 MG tablet  Commonly known as:  FOLVITE  Take 1 tablet (1 mg total) by mouth daily.     hydroxyurea 500 MG capsule  Commonly known as:  HYDREA  Take 500 mg by mouth daily.     nortriptyline 25 MG capsule  Commonly known as:  PAMELOR  Take 1 capsule (25 mg total) by mouth at bedtime.     OxyCODONE 20 mg T12a 12 hr tablet  Commonly known as:  OXYCONTIN  Take 1 tablet (20 mg total) by mouth 2 (two) times daily. Do not fill before 11/11/12.     oxyCODONE-acetaminophen  10-325 MG per tablet  Commonly known as:  PERCOCET  Take 1 tablet by mouth every 8 (eight) hours as needed for pain. Do not fill before 11/11/12     traMADol 50 MG tablet  Commonly known as:  ULTRAM  Take 100 mg by mouth every 6 (six) hours as needed for severe pain.        Discharge Instructions: Please refer to Patient Instructions section of EMR for full details.  Patient was counseled important signs and symptoms that should prompt return to medical care, changes in medications, dietary instructions, activity restrictions, and follow up appointments.   Follow-Up Appointments:     Follow-up Information   Follow up with [name], MD In 1 week. (You may request a different provider for your appointment. )    Specialty:  Family Medicine   Contact information:   [address] [place] [address] [phone]       Follow up with [facility]. (They will need to provide any written pain medicine prescriptions. )       Langston Masker, MD 01/26/2013, 11:45 PM PGY-1, [place]

## 2013-01-26 NOTE — Progress Notes (Signed)
Patient discharge Home per Md order.  Discharge instructions reviewed with patient and family.  Copies of all forms given and explained. Patient/family voiced understanding of all instructions.  Discharge in no acute distress. Adelene Idler, RN [facility], BSN, MSN

## 2013-01-26 NOTE — Progress Notes (Signed)
I have seen and examined this patient. I have discussed with Dr Skeet Simmer.  I agree with their findings and plans as documented in their progress note.  Pt is stable for discharge home.

## 2013-01-27 LAB — PATHOLOGIST SMEAR REVIEW

## 2013-01-27 NOTE — Discharge Summary (Signed)
I have seen and examined this patient. I have discussed with Dr Marsh.  I agree with their findings and plans as documented in their progress note.    

## 2013-01-30 LAB — CULTURE, BLOOD (ROUTINE X 2)
Culture: NO GROWTH
Culture: NO GROWTH

## 2013-02-17 ENCOUNTER — Other Ambulatory Visit: Payer: Self-pay | Admitting: Family Medicine

## 2013-02-17 DIAGNOSIS — D571 Sickle-cell disease without crisis: Secondary | ICD-10-CM

## 2013-02-17 MED ORDER — HYDROXYUREA 500 MG PO CAPS: 1500.0000 mg | ORAL_CAPSULE | Freq: Every day | ORAL | Status: DC

## 2013-04-29 ENCOUNTER — Ambulatory Visit (INDEPENDENT_AMBULATORY_CARE_PROVIDER_SITE_OTHER): Payer: Medicare Other | Admitting: Family Medicine

## 2013-04-29 ENCOUNTER — Encounter: Payer: Self-pay | Admitting: Family Medicine

## 2013-04-29 VITALS — BP 118/70 | HR 103 | Temp 98.7°F | Wt 132.0 lb

## 2013-04-29 DIAGNOSIS — D571 Sickle-cell disease without crisis: Secondary | ICD-10-CM

## 2013-04-29 DIAGNOSIS — N182 Chronic kidney disease, stage 2 (mild): Secondary | ICD-10-CM

## 2013-04-29 DIAGNOSIS — J029 Acute pharyngitis, unspecified: Secondary | ICD-10-CM

## 2013-04-29 LAB — COMPREHENSIVE METABOLIC PANEL
ALBUMIN: 3.9 g/dL (ref 3.5–5.2)
ALK PHOS: 118 U/L — AB (ref 39–117)
ALT: 18 U/L (ref 0–53)
AST: 42 U/L — AB (ref 0–37)
BUN: 13 mg/dL (ref 6–23)
CO2: 23 mEq/L (ref 19–32)
Calcium: 8.1 mg/dL — ABNORMAL LOW (ref 8.4–10.5)
Chloride: 110 mEq/L (ref 96–112)
Creat: 1.24 mg/dL (ref 0.50–1.35)
GLUCOSE: 96 mg/dL (ref 70–99)
POTASSIUM: 4.2 meq/L (ref 3.5–5.3)
Sodium: 141 mEq/L (ref 135–145)
Total Bilirubin: 2.2 mg/dL — ABNORMAL HIGH (ref 0.2–1.2)
Total Protein: 6.3 g/dL (ref 6.0–8.3)

## 2013-04-29 LAB — CBC WITH DIFFERENTIAL/PLATELET
BASOS PCT: 0 % (ref 0–1)
Basophils Absolute: 0 10*3/uL (ref 0.0–0.1)
EOS ABS: 0.3 10*3/uL (ref 0.0–0.7)
Eosinophils Relative: 2 % (ref 0–5)
HEMATOCRIT: 21 % — AB (ref 39.0–52.0)
HEMOGLOBIN: 7.6 g/dL — AB (ref 13.0–17.0)
Lymphocytes Relative: 42 % (ref 12–46)
Lymphs Abs: 6.2 10*3/uL — ABNORMAL HIGH (ref 0.7–4.0)
MCH: 38.4 pg — AB (ref 26.0–34.0)
MCHC: 36.2 g/dL — ABNORMAL HIGH (ref 30.0–36.0)
MCV: 106.1 fL — ABNORMAL HIGH (ref 78.0–100.0)
MONO ABS: 1.8 10*3/uL — AB (ref 0.1–1.0)
MONOS PCT: 12 % (ref 3–12)
Neutro Abs: 6.5 10*3/uL (ref 1.7–7.7)
Neutrophils Relative %: 44 % (ref 43–77)
Platelets: 460 10*3/uL — ABNORMAL HIGH (ref 150–400)
RBC: 1.98 MIL/uL — ABNORMAL LOW (ref 4.22–5.81)
RDW: 23.6 % — ABNORMAL HIGH (ref 11.5–15.5)
WBC: 14.8 10*3/uL — ABNORMAL HIGH (ref 4.0–10.5)

## 2013-04-29 LAB — POCT RAPID STREP A (OFFICE): RAPID STREP A SCREEN: NEGATIVE

## 2013-04-29 NOTE — Assessment & Plan Note (Addendum)
Recheck Creatinine today, avoid nsaids. With history of hyperkalemia, may need to consider referral to nephrology.

## 2013-04-29 NOTE — Assessment & Plan Note (Addendum)
Followed at [facility] sickle cell clinic. Will check CBC, CMET, and reticulocytes which we will send to [facility]. Pain control is now on more appropriate regimen, previously on very high dose narcotics and now only on tramadol. Patient requests some percocet to help him not take Aleve. He knows we agreed to no longer prescribe here so he will follow up with [facility]. I have encouraged him to have this discussion to be on something like low dose vicodin as I want him to avoid all NSAIDs.

## 2013-04-29 NOTE — Progress Notes (Signed)
[name] Reddish, MD Phone: [phone]  Subjective:  Chief complaint-noted  Sickle Cell Anemia Chronic Kidney Disease Patient states he has done well with no hospital admissions since December. Pain has been poorly controlled at times on tramadol but he attributes feeling better now to weather warming up and things are always worse in warm weather for him. He has been to the [facility] and he is currently only on tramadol prn. He states he has to take aleve to help out in addition to this.  We have discussed in the past avoiding NSAIDs due to CKD. Good urine output.  ROS- no fever/chills. No dysuria.   Sore Throat/cough/congestion Cough, congestion x 1 week recently improved. Sore throat has persisted during this time and is worse at night. Halls with some relief. Has been slightly more fatigued but has been resting.  ROS- no fever/unintentional weight loss. Has some ear aching.   Past Medical History- history of cocaine abuse, CKD stage II , hyperkalemia, migraine headaches, tobacco abuse  Medications- reviewed and updated Current Outpatient Prescriptions  Medication Sig Dispense Refill  . folic acid (FOLVITE) 1 MG tablet Take 1 tablet (1 mg total) by mouth daily.  30 tablet  11  . hydroxyurea (HYDREA) 500 MG capsule Take 3 capsules (1,500 mg total) by mouth daily.  90 capsule  5  . nortriptyline (PAMELOR) 25 MG capsule Take 1 capsule (25 mg total) by mouth at bedtime.  30 capsule  11  . OxyCODONE (OXYCONTIN) 20 mg T12A 12 hr tablet Take 1 tablet (20 mg total) by mouth 2 (two) times daily. Do not fill before 11/11/12.  14 tablet  0  . oxyCODONE-acetaminophen (PERCOCET) 10-325 MG per tablet Take 1 tablet by mouth every 8 (eight) hours as needed for pain. Do not fill before 11/11/12  15 tablet  0  . traMADol (ULTRAM) 50 MG tablet Take 100 mg by mouth every 6 (six) hours as needed for severe pain.       No current facility-administered medications for this visit.     Objective: BP 118/70  Pulse 103  Temp(Src) 98.7 F (37.1 C) (Oral)  Wt 132 lb (59.875 kg) Gen: NAD, resting comfortably on table HEENT: nares erythematous and swollen, oropharynx normal without pharyngeal exudate but with swollen tonsils, TM normal bilaterally, Mucous membranes are moist Neck: tender lymphadenopathy bilaterally CV: RRR no murmurs rubs or gallops Ext: no edema or pain  Assessment/Plan:  Sore Throat Likely URI with postnasal drip or viral sore throat. Centor criteria of 2 so swabbed for strep.  Symptomatic Care per AVS Doubt influenza as afebrile Patient knows to return if febrile  Sickle cell disease, type SS. No outpatient prescriptions from [facility] due to cocaine positive, opiate negative on high doses of given narcotics Followed at [facility] sickle cell clinic. Will check CBC, CMET, and reticulocytes. Pain control is now on more appropriate regimen, previously on very high dose narcotics and now only on tramadol. Patient requests some percocet to help him not take Aleve. He knows we agreed to no longer prescribe here so he will follow up with [facility]. I have encouraged him to have this discussion to be on something like low dose vicodin as I want him to avoid all NSAIDs.   CHRONIC KIDNEY DISEASE STAGE II (MILD) Recheck Creatinine today, avoid nsaids. With history of hyperkalemia, may need to consider referral to nephrology.    Orders Placed This Encounter  Procedures  . Strep A DNA probe  .  CBC  . Comprehensive metabolic panel  . Reticulocytes  . POCT rapid strep A

## 2013-04-29 NOTE — Patient Instructions (Signed)
[name],   I want to be sure that you don't have strep throat. We will call you with results. If these tests are negative, it is likely that this is just a viral sore throat OR drainage from your sinuses. I would like for you to try a neti pot to clear your isnusess. You could also try some chloraseptic throat spray which can kind of numb pain. Another idea would be taking 650 mg of tylenol before bed.  We also are going to check your blood counts, kidney and liver function, and reticulocytes. I will send a letter unless we need to talk.   Thanks, Dr. Yong Channel

## 2013-04-30 LAB — STREP A DNA PROBE: GASP: NEGATIVE

## 2013-04-30 LAB — RETICULOCYTES
ABS Retic: 195 10*3/uL — ABNORMAL HIGH (ref 19.0–186.0)
RBC.: 1.97 MIL/uL — ABNORMAL LOW (ref 4.22–5.81)
Retic Ct Pct: 9.9 % — ABNORMAL HIGH (ref 0.4–2.3)

## 2013-05-01 ENCOUNTER — Encounter: Payer: Self-pay | Admitting: Family Medicine

## 2013-05-01 ENCOUNTER — Ambulatory Visit (INDEPENDENT_AMBULATORY_CARE_PROVIDER_SITE_OTHER): Payer: Medicare Other | Admitting: Family Medicine

## 2013-05-01 VITALS — BP 113/69 | HR 103 | Temp 98.3°F | Ht 64.0 in | Wt 128.0 lb

## 2013-05-01 DIAGNOSIS — K1120 Sialoadenitis, unspecified: Secondary | ICD-10-CM

## 2013-05-01 MED ORDER — AMOXICILLIN-POT CLAVULANATE 875-125 MG PO TABS: 1.0000 | ORAL_TABLET | Freq: Two times a day (BID) | ORAL | Status: DC

## 2013-05-01 MED ORDER — IPRATROPIUM BROMIDE 0.06 % NA SOLN: 2.0000 | Freq: Four times a day (QID) | NASAL | Status: DC

## 2013-05-01 NOTE — Progress Notes (Signed)
[name] [name]. is a 33 y.o. male who presents to [facility] today for SD throat pain  Sore throat: coughing and swollen neck glands. Emesis x2 yesterday. Denies fevers, diarrhea. Denies sick contacts. Tried theraflu and halls w/o benefit. Now w/ R cheek swelling.   The following portions of the patient's history were reviewed and updated as appropriate: allergies, current medications, past medical history, family and social history, and problem list.    Past Medical History  Diagnosis Date  . Sickle cell anemia   . Sickle cell crisis 09/25/2012  . Bacterial pneumonia ~ 2012    "caught it here in the hospital" (09/25/2012)  . History of blood transfusion     "always related to sickle cell crisis" (09/25/2012)  . GERD (gastroesophageal reflux disease)     "after I eat alot of spicey foods" (09/25/2012)  . Migraines     "take RX qd to prevent them" (09/25/2012)  . Cyst of brain     "2 really small ones in the back of my head; inside; saw them w/MRI" (09/25/2012)  . Chronic kidney disease     "from my sickle cell" (09/25/2012)    ROS as above otherwise neg.    Medications reviewed. Current Outpatient Prescriptions  Medication Sig Dispense Refill  . folic acid (FOLVITE) 1 MG tablet Take 1 tablet (1 mg total) by mouth daily.  30 tablet  11  . hydroxyurea (HYDREA) 500 MG capsule Take 3 capsules (1,500 mg total) by mouth daily.  90 capsule  5  . nortriptyline (PAMELOR) 25 MG capsule Take 1 capsule (25 mg total) by mouth at bedtime.  30 capsule  11  . traMADol (ULTRAM) 50 MG tablet Take 100 mg by mouth every 6 (six) hours as needed for severe pain.       No current facility-administered medications for this visit.    Exam:  BP 113/69  Pulse 103  Temp(Src) 98.3 F (36.8 C) (Oral)  Ht 5\' 4"  (1.626 m)  Wt 128 lb (58.06 kg)  BMI 21.96 kg/m2 Gen: Well NAD HEENT: EOMI,  MMM, R parotid sweeling w/ mild ttp. Pharyngeal cobblestoning, no significant cervical lymphadenopathy  Results for  orders placed in visit on 04/29/13 (from the past 72 hour(s))  POCT RAPID STREP A (OFFICE)     Status: None   Collection Time    04/29/13  9:01 AM      Result Value Ref Range   Rapid Strep A Screen Negative  Negative  COMPREHENSIVE METABOLIC PANEL     Status: Abnormal   Collection Time    04/29/13  9:05 AM      Result Value Ref Range   Sodium 141  135 - 145 mEq/L   Potassium 4.2  3.5 - 5.3 mEq/L   Chloride 110  96 - 112 mEq/L   CO2 23  19 - 32 mEq/L   Glucose, Bld 96  70 - 99 mg/dL   BUN 13  6 - 23 mg/dL   Creat 1.24  0.50 - 1.35 mg/dL   Total Bilirubin 2.2 (*) 0.2 - 1.2 mg/dL   Alkaline Phosphatase 118 (*) 39 - 117 U/L   AST 42 (*) 0 - 37 U/L   ALT 18  0 - 53 U/L   Total Protein 6.3  6.0 - 8.3 g/dL   Albumin 3.9  3.5 - 5.2 g/dL   Calcium 8.1 (*) 8.4 - 10.5 mg/dL  RETICULOCYTES     Status: Abnormal   Collection Time  04/29/13  9:05 AM      Result Value Ref Range   Retic Ct Pct 9.9 (*) 0.4 - 2.3 %   RBC. 1.97 (*) 4.22 - 5.81 MIL/uL   ABS Retic 195.0 (*) 19.0 - 186.0 K/uL  CBC WITH DIFFERENTIAL     Status: Abnormal   Collection Time    04/29/13  9:14 AM      Result Value Ref Range   WBC 14.8 (*) 4.0 - 10.5 K/uL   RBC 1.98 (*) 4.22 - 5.81 MIL/uL   Hemoglobin 7.6 (*) 13.0 - 17.0 g/dL   HCT 21.0 (*) 39.0 - 52.0 %   MCV 106.1 (*) 78.0 - 100.0 fL   MCH 38.4 (*) 26.0 - 34.0 pg   MCHC 36.2 (*) 30.0 - 36.0 g/dL   RDW 23.6 (*) 11.5 - 15.5 %   Platelets 460 (*) 150 - 400 K/uL   Neutrophils Relative % 44  43 - 77 %   Neutro Abs 6.5  1.7 - 7.7 K/uL   Lymphocytes Relative 42  12 - 46 %   Lymphs Abs 6.2 (*) 0.7 - 4.0 K/uL   Monocytes Relative 12  3 - 12 %   Monocytes Absolute 1.8 (*) 0.1 - 1.0 K/uL   Eosinophils Relative 2  0 - 5 %   Eosinophils Absolute 0.3  0.0 - 0.7 K/uL   Basophils Relative 0  0 - 1 %   Basophils Absolute 0.0  0.0 - 0.1 K/uL   Smear Review       Comment: Target cells     Polychromasia present     Elliptocytes     Tammy Sours bodies     WBC are  unremarkable     Platelets are unremarkable.  STREP A DNA PROBE     Status: None   Collection Time    04/29/13 11:40 AM      Result Value Ref Range   GASP NEGATIVE      A/P (as seen in Problem list)  No problem-specific assessment & plan notes found for this encounter. Viral Sore throat and possible Parotitis. All still likely viral etiology. Explained at length concern for ABX overuse and resistance. Pt aware. Reiveiwed negative strep culture. Pt not compliant w/ previous recommendations from Dr Yong Channel. Again reiterated importance of antiinflammatory medications, nasal saline, tylenol chloroceptic spray, rest. Will also give intranasal atrovent to try and decrease amount of post nasal drip. Start eating hard candy to promote salivation Augmentin given w/ careful instructions for pt to not fill unless symptoms worsen to include fevers/systemic symptoms, or worsening symptoms after a couple days if other measures do not help.   Spent >74min in direct pt care and coordination

## 2013-05-01 NOTE — Patient Instructions (Signed)
Your symptoms are most likely from a virus and overusing antibiotics will lead to resistance and more severe infections in the future I think your symptoms will get better by trying the following Motrin 600 every 6 hours chloroceptic spray Nasal saline spray Tylenol 650mg  every 8 hours Nasal atrovent Eating lots of hard candy  Please do not start the antibiotics unless you develop fevers greater than 100.4 degrees become achy all over, or the other methods outlined above do not work within a few days.

## 2013-05-05 ENCOUNTER — Encounter: Payer: Self-pay | Admitting: Family Medicine

## 2013-05-09 ENCOUNTER — Emergency Department (HOSPITAL_COMMUNITY): Payer: Medicare Other

## 2013-05-09 ENCOUNTER — Inpatient Hospital Stay (HOSPITAL_COMMUNITY)
Admission: EM | Admit: 2013-05-09 | Discharge: 2013-05-10 | DRG: 812 | Disposition: A | Discharge status: Home / Self Care | Payer: Medicare Other | Attending: Family Medicine | Admitting: Family Medicine

## 2013-05-09 ENCOUNTER — Encounter: Payer: Self-pay | Admitting: Family Medicine

## 2013-05-09 ENCOUNTER — Ambulatory Visit (INDEPENDENT_AMBULATORY_CARE_PROVIDER_SITE_OTHER): Payer: Medicare Other | Admitting: Family Medicine

## 2013-05-09 ENCOUNTER — Encounter (HOSPITAL_COMMUNITY): Payer: Self-pay | Admitting: Emergency Medicine

## 2013-05-09 VITALS — BP 113/65 | HR 113 | Temp 98.4°F | Ht 63.0 in | Wt 129.5 lb

## 2013-05-09 DIAGNOSIS — E875 Hyperkalemia: Secondary | ICD-10-CM | POA: Diagnosis present

## 2013-05-09 DIAGNOSIS — D5700 Hb-SS disease with crisis, unspecified: Secondary | ICD-10-CM

## 2013-05-09 DIAGNOSIS — D571 Sickle-cell disease without crisis: Secondary | ICD-10-CM

## 2013-05-09 DIAGNOSIS — Z87891 Personal history of nicotine dependence: Secondary | ICD-10-CM

## 2013-05-09 DIAGNOSIS — F17200 Nicotine dependence, unspecified, uncomplicated: Secondary | ICD-10-CM | POA: Diagnosis present

## 2013-05-09 DIAGNOSIS — N182 Chronic kidney disease, stage 2 (mild): Secondary | ICD-10-CM | POA: Diagnosis present

## 2013-05-09 DIAGNOSIS — D72829 Elevated white blood cell count, unspecified: Secondary | ICD-10-CM | POA: Diagnosis present

## 2013-05-09 DIAGNOSIS — N189 Chronic kidney disease, unspecified: Secondary | ICD-10-CM | POA: Diagnosis present

## 2013-05-09 DIAGNOSIS — N179 Acute kidney failure, unspecified: Secondary | ICD-10-CM | POA: Diagnosis present

## 2013-05-09 DIAGNOSIS — E860 Dehydration: Secondary | ICD-10-CM | POA: Diagnosis present

## 2013-05-09 DIAGNOSIS — N184 Chronic kidney disease, stage 4 (severe): Secondary | ICD-10-CM | POA: Diagnosis present

## 2013-05-09 DIAGNOSIS — Z79899 Other long term (current) drug therapy: Secondary | ICD-10-CM

## 2013-05-09 DIAGNOSIS — I959 Hypotension, unspecified: Secondary | ICD-10-CM | POA: Diagnosis present

## 2013-05-09 LAB — CBC WITH DIFFERENTIAL/PLATELET
Basophils Absolute: 0 10*3/uL (ref 0.0–0.1)
Basophils Relative: 0 % (ref 0–1)
EOS ABS: 0.3 10*3/uL (ref 0.0–0.7)
Eosinophils Relative: 2 % (ref 0–5)
HCT: 15.5 % — ABNORMAL LOW (ref 39.0–52.0)
Hemoglobin: 5.6 g/dL — CL (ref 13.0–17.0)
Lymphocytes Relative: 45 % (ref 12–46)
Lymphs Abs: 6.8 10*3/uL — ABNORMAL HIGH (ref 0.7–4.0)
MCH: 37.3 pg — ABNORMAL HIGH (ref 26.0–34.0)
MCHC: 36.1 g/dL — AB (ref 30.0–36.0)
MCV: 103.3 fL — ABNORMAL HIGH (ref 78.0–100.0)
MONO ABS: 2 10*3/uL — AB (ref 0.1–1.0)
Monocytes Relative: 13 % — ABNORMAL HIGH (ref 3–12)
NEUTROS PCT: 40 % — AB (ref 43–77)
Neutro Abs: 6 10*3/uL (ref 1.7–7.7)
PLATELETS: 522 10*3/uL — AB (ref 150–400)
RBC: 1.5 MIL/uL — AB (ref 4.22–5.81)
RDW: 24.9 % — ABNORMAL HIGH (ref 11.5–15.5)
WBC: 15.1 10*3/uL — ABNORMAL HIGH (ref 4.0–10.5)

## 2013-05-09 LAB — URINALYSIS, ROUTINE W REFLEX MICROSCOPIC
Bilirubin Urine: NEGATIVE
GLUCOSE, UA: NEGATIVE mg/dL
HGB URINE DIPSTICK: NEGATIVE
KETONES UR: NEGATIVE mg/dL
Leukocytes, UA: NEGATIVE
NITRITE: NEGATIVE
Protein, ur: NEGATIVE mg/dL
SPECIFIC GRAVITY, URINE: 1.008 (ref 1.005–1.030)
Urobilinogen, UA: 0.2 mg/dL (ref 0.0–1.0)
pH: 6 (ref 5.0–8.0)

## 2013-05-09 LAB — CBC
HCT: 14.7 % — ABNORMAL LOW (ref 39.0–52.0)
Hemoglobin: 5.3 g/dL — CL (ref 13.0–17.0)
MCH: 37.1 pg — ABNORMAL HIGH (ref 26.0–34.0)
MCHC: 36.1 g/dL — ABNORMAL HIGH (ref 30.0–36.0)
MCV: 102.8 fL — AB (ref 78.0–100.0)
PLATELETS: 465 10*3/uL — AB (ref 150–400)
RBC: 1.43 MIL/uL — AB (ref 4.22–5.81)
RDW: 25 % — ABNORMAL HIGH (ref 11.5–15.5)
WBC: 15.2 10*3/uL — AB (ref 4.0–10.5)

## 2013-05-09 LAB — COMPREHENSIVE METABOLIC PANEL
ALBUMIN: 3.5 g/dL (ref 3.5–5.2)
ALT: 18 U/L (ref 0–53)
AST: 44 U/L — AB (ref 0–37)
Alkaline Phosphatase: 142 U/L — ABNORMAL HIGH (ref 39–117)
BUN: 20 mg/dL (ref 6–23)
CALCIUM: 8.6 mg/dL (ref 8.4–10.5)
CO2: 17 mEq/L — ABNORMAL LOW (ref 19–32)
Chloride: 104 mEq/L (ref 96–112)
Creatinine, Ser: 2.2 mg/dL — ABNORMAL HIGH (ref 0.50–1.35)
GFR calc non Af Amer: 38 mL/min — ABNORMAL LOW (ref >90)
GFR, EST AFRICAN AMERICAN: 44 mL/min — AB (ref >90)
GLUCOSE: 80 mg/dL (ref 70–99)
POTASSIUM: 5.5 meq/L — AB (ref 3.7–5.3)
SODIUM: 135 meq/L — AB (ref 137–147)
TOTAL PROTEIN: 7.5 g/dL (ref 6.0–8.3)
Total Bilirubin: 1.9 mg/dL — ABNORMAL HIGH (ref 0.3–1.2)

## 2013-05-09 LAB — CREATININE, SERUM
Creatinine, Ser: 2.21 mg/dL — ABNORMAL HIGH (ref 0.50–1.35)
GFR calc Af Amer: 44 mL/min — ABNORMAL LOW (ref >90)
GFR, EST NON AFRICAN AMERICAN: 38 mL/min — AB (ref >90)

## 2013-05-09 LAB — RETICULOCYTES
RBC.: 1.5 MIL/uL — ABNORMAL LOW (ref 4.22–5.81)
Retic Count, Absolute: 198 10*3/uL — ABNORMAL HIGH (ref 19.0–186.0)
Retic Ct Pct: 13.2 % — ABNORMAL HIGH (ref 0.4–3.1)

## 2013-05-09 LAB — PREPARE RBC (CROSSMATCH)

## 2013-05-09 LAB — ETHANOL

## 2013-05-09 MED ORDER — HYDROMORPHONE HCL PF 1 MG/ML IJ SOLN
1.0000 mg | Freq: Once | INTRAMUSCULAR | Status: AC
  Administered 2013-05-09: 1 mg via INTRAVENOUS
  Filled 2013-05-09: qty 1

## 2013-05-09 MED ORDER — NORTRIPTYLINE HCL 25 MG PO CAPS
25.0000 mg | ORAL_CAPSULE | Freq: Every day | ORAL | Status: DC
  Administered 2013-05-10: 25 mg via ORAL
  Filled 2013-05-09 (×2): qty 1

## 2013-05-09 MED ORDER — HYDROMORPHONE HCL PF 1 MG/ML IJ SOLN
INTRAMUSCULAR | Status: AC
  Filled 2013-05-09: qty 1

## 2013-05-09 MED ORDER — HYDROXYUREA 500 MG PO CAPS
1500.0000 mg | ORAL_CAPSULE | Freq: Every day | ORAL | Status: DC
  Administered 2013-05-10: 1500 mg via ORAL
  Filled 2013-05-09: qty 3

## 2013-05-09 MED ORDER — HEPARIN SODIUM (PORCINE) 5000 UNIT/ML IJ SOLN
5000.0000 [IU] | Freq: Three times a day (TID) | INTRAMUSCULAR | Status: DC
  Administered 2013-05-10 (×2): 5000 [IU] via SUBCUTANEOUS
  Filled 2013-05-09 (×5): qty 1

## 2013-05-09 MED ORDER — ACETAMINOPHEN 325 MG PO TABS: 650.0000 mg | ORAL_TABLET | ORAL | Status: DC | PRN

## 2013-05-09 MED ORDER — HYDROMORPHONE HCL PF 1 MG/ML IJ SOLN
1.0000 mg | Freq: Once | INTRAMUSCULAR | Status: AC
  Administered 2013-05-09: 1 mg via INTRAVENOUS

## 2013-05-09 MED ORDER — ONDANSETRON HCL 4 MG/2ML IJ SOLN: 4.0000 mg | INTRAMUSCULAR | Status: DC | PRN

## 2013-05-09 MED ORDER — POLYETHYLENE GLYCOL 3350 17 G PO PACK
17.0000 g | PACK | Freq: Every day | ORAL | Status: DC
  Filled 2013-05-09: qty 1

## 2013-05-09 MED ORDER — KETOROLAC TROMETHAMINE 15 MG/ML IJ SOLN
15.0000 mg | Freq: Four times a day (QID) | INTRAMUSCULAR | Status: DC
  Administered 2013-05-09: 15 mg via INTRAVENOUS
  Filled 2013-05-09: qty 1

## 2013-05-09 MED ORDER — FOLIC ACID 1 MG PO TABS
1.0000 mg | ORAL_TABLET | Freq: Every day | ORAL | Status: DC
  Administered 2013-05-10: 1 mg via ORAL
  Filled 2013-05-09 (×2): qty 1

## 2013-05-09 MED ORDER — SODIUM CHLORIDE 0.9 % IV BOLUS (SEPSIS)
1000.0000 mL | Freq: Once | INTRAVENOUS | Status: AC
  Administered 2013-05-09: 1000 mL via INTRAVENOUS

## 2013-05-09 MED ORDER — ACETAMINOPHEN 650 MG RE SUPP: 650.0000 mg | RECTAL | Status: DC | PRN

## 2013-05-09 MED ORDER — TRAMADOL HCL 50 MG PO TABS
100.0000 mg | ORAL_TABLET | Freq: Once | ORAL | Status: DC
  Filled 2013-05-09: qty 2

## 2013-05-09 MED ORDER — ONDANSETRON HCL 4 MG PO TABS: 4.0000 mg | ORAL_TABLET | ORAL | Status: DC | PRN

## 2013-05-09 MED ORDER — KETOROLAC TROMETHAMINE 60 MG/2ML IM SOLN
30.0000 mg | Freq: Once | INTRAMUSCULAR | Status: AC
  Administered 2013-05-09: 30 mg via INTRAMUSCULAR
  Filled 2013-05-09: qty 2

## 2013-05-09 NOTE — H&P (Signed)
[facility] Admission History and Physical Service Pager: [phone]  Patient name: [name] [name]. Medical record number: [id number] Date of birth: 01-09-1981 Age: 33 y.o. Gender: male  Primary Care Provider: [name], MD Consultants: None Code Status: FULL code, need to discuss   Chief Complaint: Sickle cell pain crisis  Assessment and Plan: [name] [name]. is a 33 y.o. male presenting with sickle cell pain. PMH is significant for sickle cell, GERD, migraines, and CKD.   # Sickle cell pain crisis - Presenting in usual locations (left elbow, bilateral feet). Hgb 5.6 (from 7.6), retic 13%. AFebrile and stable vitals, oxygenating well and comfortable on room air. Mild hyperbili at 1.9. - Admit to inpatient on telemetry, FPTS attending Dr Andria Frames. - With Hgb 5.6 down from reported baseline 7-8, will transfuse 2 units pRBCs now. - Supplemental O2 PRN, continuous pulse ox - Mild leukocytosis non-left-shifted; no new CXR infiltrate; no fever, cough, or SOB. Will monitor closely for acute chest. - Pain control initially with toradol 30mg  IM followed by 15mg  IM 8 hours later. Tenuous with severe pain, AKI, and hypotension. Will d/c toradol and carefully dose dilaudid. S/p 1mg  in ED. Will dose 1mg  now with BP [id number] systolic. - Add bowel regimen. - Elbow xray with effusion but no reported injury, no fever, mild swelling on exam. Will not tap, but low threshold for concern for septic joint. Sling not needed given no reported injury. - UDS and blood alcohol now  - IS, kpad PRN - AM CBC with retic. - Continue hydrea (hold if Hgb drops below 4.5, PLT below 80, or retic <80 for hgb <9), - Once BP stable, change to dilaudid PCA if pain still severe. - Large bore IV placed in ED  # Hypotension - Along with mild tachycardia, likely related to dehydration. BP 88//52, improved to [id number] systolic with IV fluids. - Transfuse 2 units pRBC. - Monitor closely for  signs of fluid overload. - Regular diet. - Hold enalapril, and tramadol.  # Acute on chronic kidney disease - Cr 2.2, from baseline ~1.5. BUN:Cr ratio ~10 raises concern for worsening kidney function. Reported normal voiding. - Will d/c toradol and dose opioid PRN pain. - S/p IV fluids, will transfuse. - Recheck in AM. FeNa and consider renal c/s if no improvement.  # Hyperkalemia - K 5.5. Asymptomatic. - Will recheck after transfusion.  - Kayexilate if still elevated.  # Leukocytosis - Not left-shifted. With elevated PLT, likely acute phase reactant. CXR nonacute. UA negative. - Monitor  FEN/GI: Regular diet, s/p 2L IV fluids, placed 1 large-bore and failed second in ED. Will transfuse 2 u pRBCs. Prophylaxis: SQ heparin  Disposition: Admit to inpatient on tele for transfusion. Pt stated he will leave today after transfusion no matter what. I advised against this.  History of Present Illness: [name] [name]. is a 33 y.o. male presenting with pain crisis. He is hesitant to talk but reports pain is in usual location: bilateral feet and left elbow. Elbow has been a little swollen and red and symptoms started this week earlier. Reports otherwise he is feeling fine: denies chest pain, dyspnea, fever, chills, diarrhea, vomiting, dysuria, or increase in scleral icterus. Reports baseline Hgb is 7-8, does not know last time he was transfused, and reports he sees [facility] and last saw them 1-2 months ago. Has never been intubated.  In the ED, pt received dilaudid 1mg  IV, toradol 30mg  IM, nacl 1L, type and screened  Review Of Systems: Per HPI with the following additions: None Otherwise 12 point review of systems was performed and was unremarkable.  Patient Active Problem List   Diagnosis Date Noted  . Vaso-occlusive sickle cell crisis 05/09/2013  . Sickle cell pain crisis 01/24/2013  . Cocaine abuse 09/26/2012  . Acute-on-chronic kidney injury 09/26/2012  . Hyperkalemia  09/26/2012  . Systolic murmur [id number]  . Migraine 11/26/2009  . CHRONIC KIDNEY DISEASE STAGE II (MILD) 03/06/2009  . Sickle cell disease, type SS. No outpatient prescriptions from [facility] due to cocaine positive, opiate negative on high doses of given narcotics 06/18/2008  . TOBACCO ABUSE 05/22/2007   Past Medical History: Past Medical History  Diagnosis Date  . Sickle cell anemia   . Sickle cell crisis 09/25/2012  . Bacterial pneumonia ~ 2012    "caught it here in the hospital" (09/25/2012)  . History of blood transfusion     "always related to sickle cell crisis" (09/25/2012)  . GERD (gastroesophageal reflux disease)     "after I eat alot of spicey foods" (09/25/2012)  . Migraines     "take RX qd to prevent them" (09/25/2012)  . Cyst of brain     "2 really small ones in the back of my head; inside; saw them w/MRI" (09/25/2012)  . Chronic kidney disease     "from my sickle cell" (09/25/2012)   Past Surgical History: Past Surgical History  Procedure Laterality Date  . Cholecystectomy  ~ 2012   Social History: History  Substance Use Topics  . Smoking status: Former Smoker    Types: Cigarettes  . Smokeless tobacco: Never Used     Comment: 09/25/2012 "I don't buy cigarettes; bum one from friends q now and then"  . Alcohol Use: Yes   Additional social history: Denies tobacco, drugs, alcohol recently. Please also refer to relevant sections of EMR.  Family History: History reviewed. No pertinent family history. Allergies and Medications: Allergies  Allergen Reactions  . Morphine And Related Other (See Comments)    "real bad headaches"   No current facility-administered medications on file prior to encounter.   Current Outpatient Prescriptions on File Prior to Encounter  Medication Sig Dispense Refill  . hydroxyurea (HYDREA) 500 MG capsule Take 3 capsules (1,500 mg total) by mouth daily.  90 capsule  5  . nortriptyline (PAMELOR) 25 MG capsule Take 1 capsule (25 mg total) by  mouth at bedtime.  30 capsule  11  . traMADol (ULTRAM) 50 MG tablet Take 100 mg by mouth every 6 (six) hours as needed for severe pain.        Objective: BP 92/56  Pulse 86  Temp(Src) 98 F (36.7 C) (Oral)  Resp 16  Ht 5\' 4"  (1.626 m)  Wt 135 lb (61.236 kg)  BMI 23.16 kg/m2  SpO2 97% Exam: General: NAD, uncomfortable and upset-appearing HEENT: AT/[place], sclera icteric, EOMI, PERRL, o/p with dry mucus membranes Cardiovascular: Mild tachycardia, regular rhythm, no m/r/g, 2+ bilateral DP pulses Respiratory: Fine bibasilar crackles, normal effort Abdomen: S/NT/ND, NABS Extremities: No edema or cyanosis, left elbow midlly swollen and mildly erythematous, tender to palpation; bibateral feet below ankles tender to palpation. Pt refuses taking off sox. No edema palpated. Skin: No rash or cyanosis. Tattoos covering body. Neuro: Awake, alert, no focal deficits, normal speech  Labs and Imaging: CBC BMET   Recent Labs Lab 05/09/13 1213  WBC 15.1*  HGB 5.6*  HCT 15.5*  PLT 522*    Recent Labs Lab 05/09/13 1213  NA 135*  K 5.5*  CL 104  CO2 17*  BUN 20  CREATININE 2.20*  GLUCOSE 80  CALCIUM 8.6      DG chest 2 view: IMPRESSION:  Chronic lung markings or peribronchial changes. No acute findings.   DG elbow complete left: IMPRESSION:  Elbow joint effusion. There is no evidence for a displaced fracture.  If there is concern for an underlying bone injury, consider  stabilization and follow up imaging.   Hilton Sinclair, MD 05/09/2013, 4:49 PM PGY-2, [place] Intern pager: [phone], text pages welcome

## 2013-05-09 NOTE — Progress Notes (Signed)
Text paged MD about pt's request for PCA Dilaudid.

## 2013-05-09 NOTE — ED Notes (Signed)
Pt. Denied wanting sling for left elbow. Pt. Resting comfortably.

## 2013-05-09 NOTE — ED Notes (Addendum)
Pt states that he came from the [facility], states that he started having pain in his feet, arms and legs. Hx of sickle cell and does not have any pain medication at home. States that he had blood work done at the clinic. Pt &Ox4 at triage, no distress. States he was told his Hgb was low

## 2013-05-09 NOTE — Progress Notes (Signed)
Patient ID: [name] [name]., male   DOB: 1980/08/28, 33 y.o.   MRN: [id number]    Subjective: HPI: Patient is a 33 y.o. male presenting to clinic today for sickle cell pain.  Sickle cell- Patient states he has been in a crisis for the last week, progressively getting worse. He was seen here last week, HgB 7.6. He is treated at [facility] for his pain, and he was seen there on March 16. Today, his pain is in his left arm/elbow. He is taking his medications as prescribed. He is requesting fluids and Toradol. No fevers, no bleeding, no trauma.  History Reviewed: Former smoker.  ROS: Please see HPI above.  Objective: Office vital signs reviewed. BP 113/65  Pulse 113  Temp(Src) 98.4 F (36.9 C) (Oral)  Ht 5\' 3"  (1.6 m)  Wt 129 lb 8 oz (58.741 kg)  BMI 22.95 kg/m2  Physical Examination:  General: Awake, alert. NAD, pleasant. HEENT: Atraumatic, normocephalic. MMM.  Pulm: CTAB, no wheezes Cardio: RRR, no murmurs appreciated Extremities: Left elbow TTP of olecranon, no gross deformity or erythema. Mild edema, unable to fully extend arm. Full flexion of elbow but painful. Neuro: Strength and sensation rossly intact  Assessment: 33 y.o. male with sickle cell pain crisis  Plan: Initially planned to treat in clinic with IV fluids, Toradol and check Hgb to avoid admission. However, POCT Hgb was 5.2 (recheck to confirm) Patient offered direct admission to the [facility] and he refused. Agreed to be seen in the ED. Offered shuttle via security, and states he will drive himself. Toradol 60 mg given IM in clinic prior to leaving. Given instructions to go directly to [facility] ED for evaluation. I have called charge nurse to make them aware of plan. Patient agrees and will report directly to [facility] ED.

## 2013-05-09 NOTE — ED Provider Notes (Signed)
CSN: [id number]     Arrival date & time 05/09/13  1128 History   First MD Initiated Contact with Patient 05/09/13 1157     Chief Complaint  Patient presents with  . Sickle Cell Pain Crisis     (Consider location/radiation/quality/duration/timing/severity/associated sxs/prior Treatment) Patient is a 33 y.o. male presenting with sickle cell pain.  Sickle Cell Pain Crisis  33 yo male with hx of SSC presents with bilateral upper and lower extremity pain that started 2 days ago. Pain described as sharp and rated 9/10 currently. Pain worse in LEFT elbow. Patient unable to extend LEFT elbow. No hx of trauma or injury. Patient seen at [facility] health family medicine today and told his hgb was down and needed to come to ED. Hgb 5.2. His baseline is 7-9. Patient admits to mild HA, denies hx of stroke blood clots, or MI. Patient denies hx of gout. Patient denies CP or SOB.  Past Medical History  Diagnosis Date  . Sickle cell anemia   . Sickle cell crisis 09/25/2012  . Bacterial pneumonia ~ 2012    "caught it here in the hospital" (09/25/2012)  . History of blood transfusion     "always related to sickle cell crisis" (09/25/2012)  . GERD (gastroesophageal reflux disease)     "after I eat alot of spicey foods" (09/25/2012)  . Migraines     "take RX qd to prevent them" (09/25/2012)  . Cyst of brain     "2 really small ones in the back of my head; inside; saw them w/MRI" (09/25/2012)  . Chronic kidney disease     "from my sickle cell" (09/25/2012)   Past Surgical History  Procedure Laterality Date  . Cholecystectomy  ~ 2012   History reviewed. No pertinent family history. History  Substance Use Topics  . Smoking status: Former Smoker    Types: Cigarettes  . Smokeless tobacco: Never Used     Comment: 09/25/2012 "I don't buy cigarettes; bum one from friends q now and then"  . Alcohol Use: Yes    Review of Systems  All other systems reviewed and are negative.      Allergies  Morphine and  related  Home Medications   No current outpatient prescriptions on file. BP 93/58  Pulse 90  Temp(Src) 97.9 F (36.6 C) (Oral)  Resp 19  Ht 5\' 3"  (1.6 m)  Wt 133 lb 9.6 oz (60.6 kg)  BMI 23.67 kg/m2  SpO2 96% Physical Exam  Nursing note and vitals reviewed. Constitutional: He is oriented to person, place, and time. He appears well-developed and well-nourished. No distress.  HENT:  Head: Normocephalic and atraumatic.  Right Ear: Tympanic membrane and ear canal normal.  Left Ear: Tympanic membrane and ear canal normal.  Nose: Nose normal. Right sinus exhibits no maxillary sinus tenderness and no frontal sinus tenderness. Left sinus exhibits no maxillary sinus tenderness and no frontal sinus tenderness.  Mouth/Throat: Uvula is midline, oropharynx is clear and moist and mucous membranes are normal. No oropharyngeal exudate, posterior oropharyngeal edema or posterior oropharyngeal erythema.  Eyes: Conjunctivae are normal. Right eye exhibits no discharge. Left eye exhibits no discharge. No scleral icterus.  Neck: Phonation normal. Neck supple. No JVD present. No rigidity. No tracheal deviation, no edema and no erythema present.  Cardiovascular: Normal rate and regular rhythm.  Exam reveals no gallop and no friction rub.   No murmur heard. Pulmonary/Chest: Effort normal and breath sounds normal. No stridor. No respiratory distress. He has no wheezes. He has  no rhonchi. He has no rales.  Abdominal: Soft. Bowel sounds are normal. He exhibits no distension. There is no hepatosplenomegaly. There is no tenderness. There is no rigidity, no rebound, no guarding, no tenderness at McBurney's point and negative Murphy's sign.  Musculoskeletal: Normal range of motion. He exhibits no edema.  No Midline Spinal tenderness. Good sensation to bilateral distal extremities.   Pain and swelling about LEFT elbow. No warmth or erythema. Maximal extension of left elbow approximately 150 degrees, which is new for  him.   Lymphadenopathy:    He has no cervical adenopathy.  Neurological: He is alert and oriented to person, place, and time.  Skin: Skin is warm and dry. He is not diaphoretic.  Psychiatric: He has a normal mood and affect. His behavior is normal.    ED Course  Procedures (including critical care time) Labs Review Labs Reviewed  CBC WITH DIFFERENTIAL - Abnormal; Notable for the following:    WBC 15.1 (*)    RBC 1.50 (*)    Hemoglobin 5.6 (*)    HCT 15.5 (*)    MCV 103.3 (*)    MCH 37.3 (*)    MCHC 36.1 (*)    RDW 24.9 (*)    Platelets 522 (*)    Neutrophils Relative % 40 (*)    Monocytes Relative 13 (*)    Lymphs Abs 6.8 (*)    Monocytes Absolute 2.0 (*)    All other components within normal limits  COMPREHENSIVE METABOLIC PANEL - Abnormal; Notable for the following:    Sodium 135 (*)    Potassium 5.5 (*)    CO2 17 (*)    Creatinine, Ser 2.20 (*)    AST 44 (*)    Alkaline Phosphatase 142 (*)    Total Bilirubin 1.9 (*)    GFR calc non Af Amer 38 (*)    GFR calc Af Amer 44 (*)    All other components within normal limits  RETICULOCYTES - Abnormal; Notable for the following:    Retic Ct Pct 13.2 (*)    RBC. 1.50 (*)    Retic Count, Manual 198.0 (*)    All other components within normal limits  URINALYSIS, ROUTINE W REFLEX MICROSCOPIC  COMPREHENSIVE METABOLIC PANEL  MAGNESIUM  CBC WITH DIFFERENTIAL  RETICULOCYTES  CBC  CREATININE, SERUM  URINE RAPID DRUG SCREEN (HOSP PERFORMED)  ETHANOL  TYPE AND SCREEN  PREPARE RBC (CROSSMATCH)   Imaging Review Dg Chest 2 View  05/09/2013   CLINICAL DATA:  Sickle cell and crisis.  EXAM: CHEST  2 VIEW  COMPARISON:  DG CHEST 2 VIEW dated 01/25/2013  FINDINGS: Again noted are prominent lung markings and suspect small calcifications at the left lung base. These findings are unchanged. There is no focal airspace disease or edema. Heart size is normal. Negative for pleural effusions. No acute bone abnormality.  IMPRESSION: Chronic  lung markings or peribronchial changes.  No acute findings.   Electronically Signed   By: Markus Daft M.D.   On: 05/09/2013 12:47   Dg Elbow Complete Left  05/09/2013   CLINICAL DATA:  Sickle cell with posterior elbow swelling.  EXAM: LEFT ELBOW - COMPLETE 3+ VIEW  COMPARISON:  None.  FINDINGS: There is a posterior fat pad sign and findings are compatible with an elbow joint effusion. The elbow is located and there is no evidence for a displaced fracture.  IMPRESSION: Elbow joint effusion. There is no evidence for a displaced fracture. If there is concern for an  underlying bone injury, consider stabilization and follow up imaging.   Electronically Signed   By: Markus Daft M.D.   On: 05/09/2013 12:49     EKG Interpretation None      MDM   Final diagnoses:  Vasoocclusive sickle cell crisis   Patient tachycardic on presentation. Afebrile in NAD.  CXR negative Elbow plain films shows joint effusion on left. No evidence of fracture. No concern for bone injury.  UA negative Leukocytosis to 15.1  Anemia with Hgb of 5.6, 2 g drop from 10 days prior. Type and screen ordered. Patient currently stable.  Hyponatremia at 135 with hyperkalemia at 5.5 likely secondary to [facility] Hyperbilirubinemia at 1.9  Elevated Cr at 2.2 Retic count elevated at 198.   Discussed patient with Dr. Tanna Furry. Plan to admit to hospital for further management. Discussed admission with patient. Patient agreeable to plan.      Sherrie George, PA-C 05/10/13 [phone]

## 2013-05-09 NOTE — ED Provider Notes (Signed)
Patient seen and evaluated. I reviewed his history.  His exam is normal. Complains of 8-9/10 pain. Symptoms are difficult control emergent. He is to grams below his baseline for his hemoglobin. He is willing to be admitted to the hospital for further care. This is appropriate. Care discussed with the family practice resident. Patient is to be admitted.  Tanna Furry, MD 05/09/13 [phone]

## 2013-05-09 NOTE — Assessment & Plan Note (Signed)
Initially planned to treat in clinic with IV fluids, Toradol and check Hgb to avoid admission. However, POCT Hgb was 5.2 (recheck to confirm) Patient offered direct admission to the [facility] and he refused. Agreed to be seen in the ED. Offered shuttle via security, and states he will drive himself. Toradol 60 mg given IM in clinic prior to leaving. Given instructions to go directly to [facility] ED for evaluation. I have called charge nurse to make them aware of plan. Patient agrees and will report directly to [facility] ED.

## 2013-05-09 NOTE — ED Notes (Signed)
Jeneen Rinks, MD at bedside.

## 2013-05-09 NOTE — Patient Instructions (Signed)
I am sorry you are in pain.  Please go directly to the ED. I will call the nurse supervisor and tell her you are coming.  Please tell them about your elbow.  Thanks, [employer]. Wrigley Plasencia, M.D.

## 2013-05-09 NOTE — Progress Notes (Signed)
Patient states he always gets Dilaudid PCA for pain along with Toradol.  Told him he has Toradol and Tylenol ordered. He says that is not enough.  Says he has pain rated "8" in all his joints.  Will page MD.

## 2013-05-10 ENCOUNTER — Other Ambulatory Visit: Payer: Self-pay

## 2013-05-10 DIAGNOSIS — N189 Chronic kidney disease, unspecified: Secondary | ICD-10-CM

## 2013-05-10 DIAGNOSIS — D571 Sickle-cell disease without crisis: Secondary | ICD-10-CM

## 2013-05-10 DIAGNOSIS — N179 Acute kidney failure, unspecified: Secondary | ICD-10-CM

## 2013-05-10 LAB — RETICULOCYTES
RBC.: 2.18 MIL/uL — AB (ref 4.22–5.81)
RETIC COUNT ABSOLUTE: 207.1 10*3/uL — AB (ref 19.0–186.0)
RETIC CT PCT: 9.5 % — AB (ref 0.4–3.1)

## 2013-05-10 LAB — CBC WITH DIFFERENTIAL/PLATELET
Basophils Absolute: 0 10*3/uL (ref 0.0–0.1)
Basophils Relative: 0 % (ref 0–1)
EOS PCT: 2 % (ref 0–5)
Eosinophils Absolute: 0.3 10*3/uL (ref 0.0–0.7)
HEMATOCRIT: 20.8 % — AB (ref 39.0–52.0)
Hemoglobin: 7.5 g/dL — ABNORMAL LOW (ref 13.0–17.0)
LYMPHS ABS: 9.1 10*3/uL — AB (ref 0.7–4.0)
Lymphocytes Relative: 61 % — ABNORMAL HIGH (ref 12–46)
MCH: 34.4 pg — ABNORMAL HIGH (ref 26.0–34.0)
MCHC: 36.1 g/dL — ABNORMAL HIGH (ref 30.0–36.0)
MCV: 95.4 fL (ref 78.0–100.0)
Monocytes Absolute: 1.2 10*3/uL — ABNORMAL HIGH (ref 0.1–1.0)
Monocytes Relative: 8 % (ref 3–12)
NEUTROS ABS: 4.3 10*3/uL (ref 1.7–7.7)
Neutrophils Relative %: 29 % — ABNORMAL LOW (ref 43–77)
Platelets: 457 10*3/uL — ABNORMAL HIGH (ref 150–400)
RBC: 2.18 MIL/uL — ABNORMAL LOW (ref 4.22–5.81)
RDW: 22.6 % — AB (ref 11.5–15.5)
WBC: 14.9 10*3/uL — AB (ref 4.0–10.5)

## 2013-05-10 LAB — COMPREHENSIVE METABOLIC PANEL
ALT: 17 U/L (ref 0–53)
AST: 43 U/L — AB (ref 0–37)
Albumin: 3 g/dL — ABNORMAL LOW (ref 3.5–5.2)
Alkaline Phosphatase: 131 U/L — ABNORMAL HIGH (ref 39–117)
BILIRUBIN TOTAL: 1.6 mg/dL — AB (ref 0.3–1.2)
BUN: 20 mg/dL (ref 6–23)
CHLORIDE: 111 meq/L (ref 96–112)
CO2: 16 mEq/L — ABNORMAL LOW (ref 19–32)
Calcium: 8.3 mg/dL — ABNORMAL LOW (ref 8.4–10.5)
Creatinine, Ser: 1.94 mg/dL — ABNORMAL HIGH (ref 0.50–1.35)
GFR calc Af Amer: 52 mL/min — ABNORMAL LOW (ref >90)
GFR calc non Af Amer: 45 mL/min — ABNORMAL LOW (ref >90)
Glucose, Bld: 88 mg/dL (ref 70–99)
POTASSIUM: 6.2 meq/L — AB (ref 3.7–5.3)
Sodium: 140 mEq/L (ref 137–147)
TOTAL PROTEIN: 6.5 g/dL (ref 6.0–8.3)

## 2013-05-10 LAB — MAGNESIUM: Magnesium: 2.3 mg/dL (ref 1.5–2.5)

## 2013-05-10 MED ORDER — SODIUM POLYSTYRENE SULFONATE 15 GM/60ML PO SUSP
30.0000 g | Freq: Once | ORAL | Status: AC
  Administered 2013-05-10: 30 g via ORAL
  Filled 2013-05-10: qty 120

## 2013-05-10 MED ORDER — HYDROMORPHONE HCL PF 1 MG/ML IJ SOLN
1.0000 mg | INTRAMUSCULAR | Status: DC | PRN
  Administered 2013-05-10 (×2): 1 mg via INTRAVENOUS
  Filled 2013-05-10 (×2): qty 1

## 2013-05-10 NOTE — Discharge Summary (Signed)
[facility] Discharge Summary  Patient name: [name] [name]. Medical record number: [id number] Date of birth: December 12, 1980 Age: 33 y.o. Gender: male Date of Admission: 05/09/2013  Date of Discharge: 05/10/13 Admitting Physician: Zigmund Gottron, MD  Primary Care Provider: [name], MD Consultants: none  Indication for Hospitalization: [place] crisis  Discharge Diagnoses/Problem List:  Ludden pain [place] crisis/anemia Hypotension AKI  Disposition: home  Discharge Condition: improved  Discharge Exam:  BP 94/59  Pulse 91  Temp(Src) 97.6 F (36.4 C) (Oral)  Resp 19  Ht 5\' 3"  (1.6 m)  Wt 133 lb 9.6 oz (60.6 kg)  BMI 23.67 kg/m2  SpO2 94%  Exam:  General: NAD,  HEENT: AT/[place], sclera icteric, EOMI, PERRL, mmm Cardiovascular: regular rhythm, no m/r/g, 2+ bilateral DP pulses  Respiratory: CTAB, normal effort  Abdomen: S/NT/ND, NABS  Extremities: No edema or cyanosis, NO joint effusion or pain. No edema palpated.  Skin: No rash or cyanosis. Tattoos covering body.  Neuro: Awake, alert, no focal deficits, normal speech  Brief Hospital Course:  [name] Sampsell. is a 33 y.o. male presenting with sickle cell pain. PMH is significant for sickle cell, GERD, migraines, and CKD.   # Sickle cell pain crisis: presented in usual locations (left elbow, bilateral feet). On admission Hgb 5.6 (from 7.6), retic 13%, afebrile and stable vitals, oxygenating well and comfortable on room air, and mild hyperbili at 1.9. Pt received 2units PRBC on 3/27 w/ improvement in overall condition/pain.  Mild leukocytosis non-left-shifted; no new CXR infiltrate; no fever, cough, or SOB so pt not treated for acute chest. Pain control initially with toradol 30mg  IM followed by 15mg  IM 8 hours later and Dilaudid 1mg  Q4prn.  UDS not drawn and blood alcohol nml. AM CBC with retic showed improvement. Pts home hydroxyurea continued. Pt sent home on home pain regimen  # Hypotension  - Along with mild tachycardia, likely related to dehydration. BP 88//52, improved to [id number] systolic with IV fluids and 2 units pRBC. Held enalapril.   # Acute on chronic kidney disease - Cr 2.2 elevated, from baseline ~1.5 on admission. Likely secondary to dehydration and [place] crisis. IVF and transfusion w/ improvement in Cr by the followign morning  # Hyperkalemia - K 5.5. Asymptomatic. Elevated to 6.2 by the following morning. Likely from hemolysis from transfusion. Kexalate x1 given. EKG nml. Repeat K refused by pt despite medical advise.   # Leukocytosis - Not left-shifted. With elevated PLT, likely acute phase reactant. CXR nonacute. UA negative.   Issues for Follow Up:  [place] pain/anemia  Significant Procedures:  PRBC transfusion 05/09/13  Significant Labs and Imaging:   Recent Labs Lab 05/09/13 1213 05/09/13 2042 05/10/13 0633  WBC 15.1* 15.2* 14.9*  HGB 5.6* 5.3* 7.5*  HCT 15.5* 14.7* 20.8*  PLT 522* 465* 457*    Recent Labs Lab 05/09/13 1213 05/09/13 2042 05/10/13 0633  NA 135*  --  140  K 5.5*  --  6.2*  CL 104  --  111  CO2 17*  --  16*  GLUCOSE 80  --  88  BUN 20  --  20  CREATININE 2.20* 2.21* 1.94*  CALCIUM 8.6  --  8.3*  MG  --   --  2.3  ALKPHOS 142*  --  131*  AST 44*  --  43*  ALT 18  --  17  ALBUMIN 3.5  --  3.0*   Results/Tests Pending at Time of Discharge:   Discharge Medications:  Medication List    ASK your doctor about these medications       enalapril 5 MG tablet  Commonly known as:  VASOTEC  Take 1 tablet by mouth daily.     hydroxyurea 500 MG capsule  Commonly known as:  HYDREA  Take 3 capsules (1,500 mg total) by mouth daily.     naproxen sodium 220 MG tablet  Commonly known as:  ANAPROX  Take 220-440 mg by mouth 2 (two) times daily with a meal.     nortriptyline 25 MG capsule  Commonly known as:  PAMELOR  Take 1 capsule (25 mg total) by mouth at bedtime.     traMADol 50 MG tablet  Commonly known as:  ULTRAM  Take 100 mg by  mouth every 6 (six) hours as needed for severe pain.        Discharge Instructions: Please refer to Patient Instructions section of EMR for full details.  Patient was counseled important signs and symptoms that should prompt return to medical care, changes in medications, dietary instructions, activity restrictions, and follow up appointments.   Follow-Up Appointments:   Waldemar Dickens, MD 05/10/2013, 11:31 AM PGY-3, [place]

## 2013-05-10 NOTE — H&P (Signed)
Seen and examined.  Discussed with Dr. Darene Lamer.  Agree with her management and documentation.  Briefly, 33 yo with SS crisis (left elbow), worsened anemia and hypotension.  Mainly because of low BP, we elected to transfuse.  Feels much better this am.  "I'm ready to go home."  Pain is controled.  OK to discharge today.

## 2013-05-10 NOTE — Discharge Summary (Signed)
Seen and examined.  Agree with DC as outlined by Dr. Marily Memos.

## 2013-05-10 NOTE — Discharge Instructions (Addendum)
Continue all of your home medications and follow up at the [facility] clinic in 1-2 weeks or sooner if needed  Sickle Cell Anemia, Adult Sickle cell anemia is a condition in which red blood cells have an abnormal "sickle" shape. This abnormal shape shortens the cells' life span, which results in a lower than normal concentration of red blood cells in the blood. The sickle shape also causes the cells to clump together and block free blood flow through the blood vessels. As a result, the tissues and organs of the body do not receive enough oxygen. Sickle cell anemia causes organ damage and pain and increases the risk of infection. CAUSES  Sickle cell anemia is a genetic disorder. Those who receive two copies of the gene have the condition, and those who receive one copy have the trait. RISK FACTORS The sickle cell gene is most common in people whose families originated in [place]. Other areas of the globe where sickle cell trait occurs include the [place], [place] and [place], and the [place].  SIGNS AND SYMPTOMS  Pain, especially in the extremities, back, chest, or abdomen (common). The pain may start suddenly or may develop following an illness, especially if there is dehydration. Pain can also occur due to overexertion or exposure to extreme temperature changes.  Frequent severe bacterial infections, especially certain types of pneumonia and meningitis.  Pain and swelling in the hands and feet.  Decreased activity.   Loss of appetite.   Change in behavior.  Headaches.  Seizures.  Shortness of breath or difficulty breathing.  Vision changes.  Skin ulcers. Those with the trait may not have symptoms or they may have mild symptoms.  DIAGNOSIS  Sickle cell anemia is diagnosed with blood tests that demonstrate the genetic trait. It is often diagnosed during the newborn period, due to mandatory testing nationwide. A variety of blood tests, X-rays, CT scans,  MRI scans, ultrasounds, and lung function tests may also be done to monitor the condition. TREATMENT  Sickle cell anemia may be treated with:  Medicines. You may be given pain medicines, antibiotic medicines (to treat and prevent infections) or medicines to increase the production of certain types of hemoglobin.  Fluids.  Oxygen.  Blood transfusions. HOME CARE INSTRUCTIONS   Drink enough fluid to keep your urine clear or pale yellow. Increase your fluid intake in hot weather and during exercise.  Do not smoke. Smoking lowers oxygen levels in the blood.   Only take over-the-counter or prescription medicines for pain, fever, or discomfort as directed by your health care provider.  Take antibiotics as directed by your health care provider. Make sure you finish them it even if you start to feel better.   Take supplements as directed by your health care provider.   Consider wearing a medical alert bracelet. This tells anyone caring for you in an emergency of your condition.   When traveling, keep your medical information, health care provider's names, and the medicines you take with you at all times.   If you develop a fever, do not take medicines to reduce the fever right away. This could cover up a problem that is developing. Notify your health care provider.  Keep all follow-up appointments with your health care provider. Sickle cell anemia requires regular medical care. SEEK MEDICAL CARE IF: You have a fever. SEEK IMMEDIATE MEDICAL CARE IF:   You feel dizzy or faint.   You have new abdominal pain, especially on the left side near the  stomach area.   You develop a persistent, often uncomfortable and painful penile erection (priapism). If this is not treated immediately it will lead to impotence.   You have numbness your arms or legs or you have a hard time moving them.   You have a hard time with speech.   You have a fever or persistent symptoms for more than 2 3  days.   You have a fever and your symptoms suddenly get worse.   You have signs or symptoms of infection. These include:   Chills.   Abnormal tiredness (lethargy).   Irritability.   Poor eating.   Vomiting.   You develop pain that is not helped with medicine.   You develop shortness of breath.  You have pain in your chest.   You are coughing up pus-like or bloody sputum.   You develop a stiff neck.  Your feet or hands swell or have pain.  Your abdomen appears bloated.  You develop joint pain. MAKE SURE YOU:  Understand these instructions.  Will watch your child's condition.  Will get help right away if your child is not doing well or gets worse. Document Released: 05/10/2005 Document Revised: 11/20/2012 Document Reviewed: 09/11/2012 [facility] Patient Information 2014 [place], [place].

## 2013-05-10 NOTE — Progress Notes (Signed)
Pt discharged to home by self.  Dr. Tamala Julian OK'd pt to be discharged after reviewing his EKG done since his K was 6.2.  Kayexalate 30 g given prior to pt leaving.  Pt given copy of DC instructions and reviewed.  No further questions about home self care.  Pt states he feels better.

## 2013-05-11 LAB — TYPE AND SCREEN
ABO/RH(D): O POS
ANTIBODY SCREEN: NEGATIVE
DONOR AG TYPE: NEGATIVE
Donor AG Type: NEGATIVE
Unit division: 0
Unit division: 0

## 2013-05-11 NOTE — ED Provider Notes (Signed)
Medical screening examination/treatment/procedure(s) were conducted as a shared visit with non-physician practitioner(s) and myself.  I personally evaluated the patient during the encounter.   EKG Interpretation None      Patient examined. History reviewed. Care reviewed with [place], [place].  He is two grams below his baseline hemoglobin.  He remains symptomatic. He will be admitted. He has not tachycardic or hypotensive.  Tanna Furry, MD 05/11/13 [phone]

## 2013-05-12 MED ORDER — KETOROLAC TROMETHAMINE 60 MG/2ML IM SOLN
60.0000 mg | Freq: Once | INTRAMUSCULAR | Status: AC
  Administered 2013-05-12: 60 mg via INTRAMUSCULAR

## 2013-05-12 NOTE — Addendum Note (Signed)
Addended by: Valerie Roys on: 05/12/2013 08:41 AM   Modules accepted: Orders

## 2013-05-13 LAB — PATHOLOGIST SMEAR REVIEW

## 2013-07-28 ENCOUNTER — Encounter (HOSPITAL_COMMUNITY): Payer: Self-pay | Admitting: Emergency Medicine

## 2013-07-28 ENCOUNTER — Inpatient Hospital Stay (HOSPITAL_COMMUNITY)
Admission: EM | Admit: 2013-07-28 | Discharge: 2013-07-31 | DRG: 812 | Disposition: A | Discharge status: Home / Self Care | Payer: No Typology Code available for payment source | Attending: Family Medicine | Admitting: Family Medicine

## 2013-07-28 ENCOUNTER — Emergency Department (HOSPITAL_COMMUNITY): Payer: No Typology Code available for payment source

## 2013-07-28 DIAGNOSIS — Z885 Allergy status to narcotic agent status: Secondary | ICD-10-CM | POA: Diagnosis not present

## 2013-07-28 DIAGNOSIS — N179 Acute kidney failure, unspecified: Secondary | ICD-10-CM | POA: Diagnosis present

## 2013-07-28 DIAGNOSIS — D571 Sickle-cell disease without crisis: Secondary | ICD-10-CM

## 2013-07-28 DIAGNOSIS — Z9089 Acquired absence of other organs: Secondary | ICD-10-CM

## 2013-07-28 DIAGNOSIS — E876 Hypokalemia: Secondary | ICD-10-CM | POA: Diagnosis present

## 2013-07-28 DIAGNOSIS — D5700 Hb-SS disease with crisis, unspecified: Principal | ICD-10-CM | POA: Diagnosis present

## 2013-07-28 DIAGNOSIS — N182 Chronic kidney disease, stage 2 (mild): Secondary | ICD-10-CM

## 2013-07-28 DIAGNOSIS — N189 Chronic kidney disease, unspecified: Secondary | ICD-10-CM

## 2013-07-28 DIAGNOSIS — Z87891 Personal history of nicotine dependence: Secondary | ICD-10-CM

## 2013-07-28 DIAGNOSIS — K219 Gastro-esophageal reflux disease without esophagitis: Secondary | ICD-10-CM | POA: Diagnosis present

## 2013-07-28 DIAGNOSIS — F1410 Cocaine abuse, uncomplicated: Secondary | ICD-10-CM | POA: Diagnosis present

## 2013-07-28 DIAGNOSIS — E860 Dehydration: Secondary | ICD-10-CM | POA: Diagnosis present

## 2013-07-28 DIAGNOSIS — M79609 Pain in unspecified limb: Secondary | ICD-10-CM | POA: Diagnosis present

## 2013-07-28 LAB — COMPREHENSIVE METABOLIC PANEL
ALBUMIN: 3.9 g/dL (ref 3.5–5.2)
ALT: 22 U/L (ref 0–53)
AST: 42 U/L — ABNORMAL HIGH (ref 0–37)
Alkaline Phosphatase: 161 U/L — ABNORMAL HIGH (ref 39–117)
BUN: 16 mg/dL (ref 6–23)
CALCIUM: 8.4 mg/dL (ref 8.4–10.5)
CO2: 31 mEq/L (ref 19–32)
Chloride: 97 mEq/L (ref 96–112)
Creatinine, Ser: 1.87 mg/dL — ABNORMAL HIGH (ref 0.50–1.35)
GFR calc Af Amer: 54 mL/min — ABNORMAL LOW (ref >90)
GFR calc non Af Amer: 47 mL/min — ABNORMAL LOW (ref >90)
Glucose, Bld: 99 mg/dL (ref 70–99)
Potassium: 3.1 mEq/L — ABNORMAL LOW (ref 3.7–5.3)
SODIUM: 144 meq/L (ref 137–147)
TOTAL PROTEIN: 6.7 g/dL (ref 6.0–8.3)
Total Bilirubin: 3.4 mg/dL — ABNORMAL HIGH (ref 0.3–1.2)

## 2013-07-28 LAB — CBC WITH DIFFERENTIAL/PLATELET
Basophils Absolute: 0 10*3/uL (ref 0.0–0.1)
Basophils Relative: 0 % (ref 0–1)
Eosinophils Absolute: 0.1 10*3/uL (ref 0.0–0.7)
Eosinophils Relative: 1 % (ref 0–5)
HCT: 19.3 % — ABNORMAL LOW (ref 39.0–52.0)
Hemoglobin: 7 g/dL — ABNORMAL LOW (ref 13.0–17.0)
LYMPHS PCT: 48 % — AB (ref 12–46)
Lymphs Abs: 4.7 10*3/uL — ABNORMAL HIGH (ref 0.7–4.0)
MCH: 43.5 pg — AB (ref 26.0–34.0)
MCHC: 36.3 g/dL — AB (ref 30.0–36.0)
MCV: 119.9 fL — ABNORMAL HIGH (ref 78.0–100.0)
MONO ABS: 1.1 10*3/uL — AB (ref 0.1–1.0)
Monocytes Relative: 11 % (ref 3–12)
NEUTROS PCT: 40 % — AB (ref 43–77)
Neutro Abs: 3.9 10*3/uL (ref 1.7–7.7)
PLATELETS: 219 10*3/uL (ref 150–400)
RBC: 1.61 MIL/uL — ABNORMAL LOW (ref 4.22–5.81)
WBC: 9.8 10*3/uL (ref 4.0–10.5)

## 2013-07-28 LAB — RETICULOCYTES
RBC.: 1.61 MIL/uL — ABNORMAL LOW (ref 4.22–5.81)
RETIC COUNT ABSOLUTE: 172.3 10*3/uL (ref 19.0–186.0)
Retic Ct Pct: 10.7 % — ABNORMAL HIGH (ref 0.4–3.1)

## 2013-07-28 LAB — PREPARE RBC (CROSSMATCH)

## 2013-07-28 MED ORDER — SODIUM CHLORIDE 0.9 % IV BOLUS (SEPSIS)
2000.0000 mL | Freq: Once | INTRAVENOUS | Status: AC
  Administered 2013-07-28: 1000 mL via INTRAVENOUS

## 2013-07-28 MED ORDER — SODIUM CHLORIDE 0.9 % IJ SOLN
3.0000 mL | Freq: Two times a day (BID) | INTRAMUSCULAR | Status: DC
  Administered 2013-07-29 – 2013-07-30 (×3): 3 mL via INTRAVENOUS

## 2013-07-28 MED ORDER — OXYCODONE-ACETAMINOPHEN 5-325 MG PO TABS
2.0000 | ORAL_TABLET | Freq: Four times a day (QID) | ORAL | Status: DC
  Administered 2013-07-28 – 2013-07-31 (×8): 2 via ORAL
  Filled 2013-07-28 (×9): qty 2

## 2013-07-28 MED ORDER — ONDANSETRON HCL 4 MG/2ML IJ SOLN
4.0000 mg | Freq: Once | INTRAMUSCULAR | Status: AC
  Administered 2013-07-28: 4 mg via INTRAVENOUS
  Filled 2013-07-28: qty 2

## 2013-07-28 MED ORDER — NORTRIPTYLINE HCL 25 MG PO CAPS
25.0000 mg | ORAL_CAPSULE | Freq: Every day | ORAL | Status: DC
  Administered 2013-07-28 – 2013-07-30 (×3): 25 mg via ORAL
  Filled 2013-07-28 (×4): qty 1

## 2013-07-28 MED ORDER — DIPHENHYDRAMINE HCL 50 MG/ML IJ SOLN
25.0000 mg | Freq: Once | INTRAMUSCULAR | Status: AC
  Administered 2013-07-28: 25 mg via INTRAVENOUS
  Filled 2013-07-28: qty 1

## 2013-07-28 MED ORDER — ONDANSETRON HCL 4 MG/2ML IJ SOLN: 4.0000 mg | Freq: Four times a day (QID) | INTRAMUSCULAR | Status: DC | PRN

## 2013-07-28 MED ORDER — ONDANSETRON HCL 4 MG PO TABS: 4.0000 mg | ORAL_TABLET | Freq: Four times a day (QID) | ORAL | Status: DC | PRN

## 2013-07-28 MED ORDER — HYDROXYUREA 500 MG PO CAPS
1500.0000 mg | ORAL_CAPSULE | Freq: Every day | ORAL | Status: DC
  Administered 2013-07-29 – 2013-07-31 (×3): 1500 mg via ORAL
  Filled 2013-07-28 (×3): qty 3

## 2013-07-28 MED ORDER — HYDROMORPHONE HCL PF 1 MG/ML IJ SOLN
1.0000 mg | Freq: Once | INTRAMUSCULAR | Status: AC
  Administered 2013-07-28: 1 mg via INTRAVENOUS
  Filled 2013-07-28: qty 1

## 2013-07-28 MED ORDER — HYDROMORPHONE HCL PF 1 MG/ML IJ SOLN
1.0000 mg | INTRAMUSCULAR | Status: DC | PRN
  Administered 2013-07-28 – 2013-07-30 (×8): 1 mg via INTRAVENOUS
  Filled 2013-07-28 (×8): qty 1

## 2013-07-28 MED ORDER — HEPARIN SODIUM (PORCINE) 5000 UNIT/ML IJ SOLN
5000.0000 [IU] | Freq: Three times a day (TID) | INTRAMUSCULAR | Status: DC
  Administered 2013-07-28 – 2013-07-31 (×8): 5000 [IU] via SUBCUTANEOUS
  Filled 2013-07-28 (×11): qty 1

## 2013-07-28 MED ORDER — FENTANYL CITRATE 0.05 MG/ML IJ SOLN
100.0000 ug | Freq: Once | INTRAMUSCULAR | Status: AC
  Administered 2013-07-28: 100 ug via INTRAVENOUS
  Filled 2013-07-28: qty 2

## 2013-07-28 MED ORDER — SODIUM CHLORIDE 0.9 % IV SOLN
INTRAVENOUS | Status: DC
  Administered 2013-07-29: 03:00:00 via INTRAVENOUS
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
## 2013-07-28 NOTE — ED Notes (Signed)
Patient returned from X-ray 

## 2013-07-28 NOTE — Progress Notes (Signed)
Notified of pt. Assignment at 1745. Received report from [facility] in the ED at 1750. Awaiting pt arrival to the unit.

## 2013-07-28 NOTE — ED Notes (Signed)
Admitting MD at the bedside.  

## 2013-07-28 NOTE — ED Notes (Signed)
Patient transported by [employer] .

## 2013-07-28 NOTE — ED Notes (Signed)
Dr. Stevie Kern at the bedside.
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
## 2013-07-29 ENCOUNTER — Encounter (HOSPITAL_COMMUNITY): Payer: Self-pay | Admitting: General Practice

## 2013-07-29 DIAGNOSIS — N182 Chronic kidney disease, stage 2 (mild): Secondary | ICD-10-CM

## 2013-07-29 DIAGNOSIS — D5700 Hb-SS disease with crisis, unspecified: Principal | ICD-10-CM

## 2013-07-29 DIAGNOSIS — N179 Acute kidney failure, unspecified: Secondary | ICD-10-CM

## 2013-07-29 DIAGNOSIS — F1410 Cocaine abuse, uncomplicated: Secondary | ICD-10-CM

## 2013-07-29 DIAGNOSIS — N189 Chronic kidney disease, unspecified: Secondary | ICD-10-CM

## 2013-07-29 LAB — BASIC METABOLIC PANEL
BUN: 10 mg/dL (ref 6–23)
CHLORIDE: 102 meq/L (ref 96–112)
CO2: 26 mEq/L (ref 19–32)
Calcium: 8.2 mg/dL — ABNORMAL LOW (ref 8.4–10.5)
Creatinine, Ser: 1.5 mg/dL — ABNORMAL HIGH (ref 0.50–1.35)
GFR calc non Af Amer: 61 mL/min — ABNORMAL LOW (ref >90)
GFR, EST AFRICAN AMERICAN: 71 mL/min — AB (ref >90)
Glucose, Bld: 98 mg/dL (ref 70–99)
POTASSIUM: 3.6 meq/L — AB (ref 3.7–5.3)
Sodium: 142 mEq/L (ref 137–147)

## 2013-07-29 LAB — CBC
HCT: 20.2 % — ABNORMAL LOW (ref 39.0–52.0)
Hemoglobin: 7.2 g/dL — ABNORMAL LOW (ref 13.0–17.0)
MCH: 37.9 pg — AB (ref 26.0–34.0)
MCHC: 35.6 g/dL (ref 30.0–36.0)
MCV: 106.3 fL — ABNORMAL HIGH (ref 78.0–100.0)
PLATELETS: DECREASED 10*3/uL (ref 150–400)
RBC: 1.9 MIL/uL — ABNORMAL LOW (ref 4.22–5.81)
WBC: 14.6 10*3/uL — ABNORMAL HIGH (ref 4.0–10.5)

## 2013-07-29 LAB — PREPARE RBC (CROSSMATCH)

## 2013-07-29 MED ORDER — POTASSIUM CHLORIDE CRYS ER 20 MEQ PO TBCR
40.0000 meq | EXTENDED_RELEASE_TABLET | Freq: Once | ORAL | Status: AC
  Administered 2013-07-29: 40 meq via ORAL
  Filled 2013-07-29: qty 2

## 2013-07-29 MED ORDER — WHITE PETROLATUM GEL
Status: AC
  Administered 2013-07-29: 0.2
  Filled 2013-07-29: qty 5

## 2013-07-29 MED ORDER — POLYVINYL ALCOHOL 1.4 % OP SOLN
1.0000 [drp] | OPHTHALMIC | Status: DC | PRN
  Administered 2013-07-29: 1 [drp] via OPHTHALMIC
  Filled 2013-07-29: qty 15

## 2013-07-29 NOTE — H&P (Signed)
FMTS Attending Admission Note: Annabell Sabal MD Personal pager:  [phone] FPTS Service Pager:  [phone]  I  have seen and examined this patient, reviewed their chart. I have discussed this patient with the resident. I agree with the resident's findings, assessment and care plan.  Additionally:  - Admitted for sickle cell pain crisis.  Did well overnight.  States Dilaudid makes him sleepy but doesn't help as much as Toradol for pain, which he cannot have b/c of kidney disease.  CBC did not rise much with blood.  May need to re-transfuse today.  WBC elevated but no signs/sx's of acute chest or other infection.  Continue pain relief  Alveda Reasons, MD 07/29/2013 9:53 AM

## 2013-07-29 NOTE — Progress Notes (Signed)
Pt admitted to the unit at 1818. Pt mental status is Alert and oriented x 4. Pt oriented to room, staff, and call bell. Skin is intact. Full assessment charted in [facility]. Call bell within reach. Visitor guidelines reviewed w/ pt and/or family.

## 2013-07-29 NOTE — Progress Notes (Signed)
Lab came to draw blood and patient refused at the time dt taking a bed bath. I spoke with lab whom said they would come back shortly to draw post blood transfusion CBC.
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
## 2013-07-29 NOTE — Progress Notes (Signed)
FMTS Attending Daily Note:  Annabell Sabal MD  [phone] pager  Family Practice pager:  [phone] I have seen and examined this patient and have reviewed their chart. I have discussed this patient with the resident. I agree with the resident's findings, assessment and care plan.   Alveda Reasons, MD 07/29/2013 1:42 PM

## 2013-07-30 ENCOUNTER — Encounter: Payer: Self-pay | Admitting: Clinical

## 2013-07-30 DIAGNOSIS — D571 Sickle-cell disease without crisis: Secondary | ICD-10-CM

## 2013-07-30 LAB — CBC
HEMATOCRIT: 22 % — AB (ref 39.0–52.0)
HEMATOCRIT: 22.6 % — AB (ref 39.0–52.0)
Hemoglobin: 7.9 g/dL — ABNORMAL LOW (ref 13.0–17.0)
Hemoglobin: 7.9 g/dL — ABNORMAL LOW (ref 13.0–17.0)
MCH: 36.9 pg — ABNORMAL HIGH (ref 26.0–34.0)
MCH: 36.1 pg — ABNORMAL HIGH (ref 26.0–34.0)
MCHC: 35.9 g/dL (ref 30.0–36.0)
MCHC: 35 g/dL (ref 30.0–36.0)
MCV: 102.8 fL — AB (ref 78.0–100.0)
MCV: 103.2 fL — AB (ref 78.0–100.0)
Platelets: 162 10*3/uL (ref 150–400)
Platelets: 161 10*3/uL (ref 150–400)
RBC: 2.14 MIL/uL — ABNORMAL LOW (ref 4.22–5.81)
RBC: 2.19 MIL/uL — ABNORMAL LOW (ref 4.22–5.81)
WBC: 12.3 10*3/uL — ABNORMAL HIGH (ref 4.0–10.5)
WBC: 10 10*3/uL (ref 4.0–10.5)

## 2013-07-30 LAB — RETICULOCYTES
RBC.: 2.14 MIL/uL — ABNORMAL LOW (ref 4.22–5.81)
Retic Count, Absolute: 280.3 10*3/uL — ABNORMAL HIGH (ref 19.0–186.0)
Retic Ct Pct: 13.1 % — ABNORMAL HIGH (ref 0.4–3.1)

## 2013-07-30 LAB — BASIC METABOLIC PANEL
BUN: 8 mg/dL (ref 6–23)
CALCIUM: 8.6 mg/dL (ref 8.4–10.5)
CO2: 22 mEq/L (ref 19–32)
CREATININE: 1.31 mg/dL (ref 0.50–1.35)
Chloride: 101 mEq/L (ref 96–112)
GFR calc Af Amer: 83 mL/min — ABNORMAL LOW (ref >90)
GFR, EST NON AFRICAN AMERICAN: 72 mL/min — AB (ref >90)
Glucose, Bld: 87 mg/dL (ref 70–99)
Potassium: 3.9 mEq/L (ref 3.7–5.3)
Sodium: 138 mEq/L (ref 137–147)

## 2013-07-30 LAB — TYPE AND SCREEN
ABO/RH(D): O POS
Antibody Screen: NEGATIVE
DONOR AG TYPE: NEGATIVE
Donor AG Type: NEGATIVE
UNIT DIVISION: 0
Unit division: 0

## 2013-07-30 MED ORDER — DICLOFENAC SODIUM 50 MG PO TBEC
50.0000 mg | DELAYED_RELEASE_TABLET | Freq: Three times a day (TID) | ORAL | Status: DC
  Filled 2013-07-30 (×2): qty 1

## 2013-07-30 MED ORDER — DICLOFENAC SODIUM 50 MG PO TBEC: 50.0000 mg | DELAYED_RELEASE_TABLET | Freq: Three times a day (TID) | ORAL | Status: DC

## 2013-07-30 MED ORDER — DICLOFENAC SODIUM 75 MG PO TBEC
75.0000 mg | DELAYED_RELEASE_TABLET | Freq: Two times a day (BID) | ORAL | Status: DC
  Administered 2013-07-30: 75 mg via ORAL
  Filled 2013-07-30 (×3): qty 1

## 2013-07-30 MED ORDER — HYDROMORPHONE HCL PF 1 MG/ML IJ SOLN
1.0000 mg | INTRAMUSCULAR | Status: AC | PRN
  Administered 2013-07-30 – 2013-07-31 (×4): 1 mg via INTRAVENOUS
  Filled 2013-07-30 (×4): qty 1

## 2013-07-30 NOTE — Progress Notes (Signed)
CSW received a request for PCS however CSW observed that pt is currently in the hospital. CSW to discuss PCS request with PCP to determine whether pt qualifies for this service. If pt qualifies, PCS application will be submitted after pt discharges from the hospital.  Hunt Oris, MSW, [place]

## 2013-07-30 NOTE — Progress Notes (Signed)
FMTS Attending Daily Note:  Annabell Sabal MD  [phone] pager  Family Practice pager:  [phone] I have seen and examined this patient and have reviewed their chart. I have discussed this patient with the resident. I agree with the resident's findings, assessment and care plan.  Additionally:  Pain better, but still not yet back to baseline. Will restart Dilaudid as no pain relief with Percocet. Attempt Diclofenac as more liver metabolism rather than renal. No DC today  Alveda Reasons, MD 07/30/2013 3:20 PM

## 2013-07-30 NOTE — Progress Notes (Signed)
CSW received completed PCS form from PCP. PCS form has been faxed to [employer] and [employer] as requested by pt. [employer] will contact pt to schedule a home visit and determine whether pt will qualify for services.  Hunt Oris, MSW, [place]
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
## 2013-07-30 NOTE — Progress Notes (Signed)
Call from lab, blood colleted was clotted, needed new sample.

## 2013-07-30 NOTE — Discharge Instructions (Signed)
Since you have kidney issues, it is best to stay away from medication like Ibuprofen (Advil) and naproxen (Aleve). You are going home with some medication to use. Please keep your appointment with your PCP, Dr. Yong Channel, so he can manage your pain management further. We gave you 2 units of blood while you were admitted.   Sickle Cell Anemia, Adult Sickle cell anemia is a condition where your red blood cells are shaped like sickles. Red blood cells carry oxygen through the body. Sickle-shaped red blood cells cells do not live as long as normal red blood cells. They also clump together and block blood from flowing through the blood vessels. These things prevent the body from getting enough oxygen. Sickle cell anemia causes organ damage and pain. It also increases the risk of infection. HOME CARE  Drink enough fluid to keep your pee (urine) clear or pale yellow. Drink more in hot weather and during exercise.  Do not smoke. Smoking lowers oxygen levels in the blood.  Only take over-the-counter or prescription medicines as told by your doctor.  Take antibiotic medicines as told by your doctor. Make sure you finish them it even if you start to feel better.  Take supplements as told by your doctor.  Consider wearing a medical alert bracelet. This tells anyone caring for you in an emergency of your condition.  When traveling, keep your medical information, doctors' names, and the medicines you take with you at all times.  If you have a fever, do not take fever medicines right away. This could cover up a problem. Tell your doctor.   Keep all follow-up appointments with your doctor. Sickle cell anemia requires regular medical care. GET HELP IF: You have a fever. GET HELP RIGHT AWAY IF:  You feel dizzy or faint.  You have new belly (abdominal) pain, especially on the left side near the stomach area.  You have a lasting, often uncomfortable and painful erection of the penis (priapism). If it is not  treated right away, you will become unable to have sex (impotence).  You have numbness your arms or legs or you have a hard time moving them.  You have a hard time talking.  You have a fever or lasting symptoms for more than 2 3 days.  You have a fever and your symptoms suddenly get worse.  You have signs or symptoms of infection. These include:  Chills.  Being more tired than normal (lethargy).  Irritability.  Poor eating.  Throwing up (vomiting).  You have pain that is not helped with medicine.  You have shortness of breath.  You have pain in your chest.  You are coughing up pus-like or bloody mucus.  You have a stiff neck.  Your feet or hands swell or have pain.  Your belly looks bloated.  Your joints hurt. MAKE SURE YOU:  Understand these instructions.  Will watch your condition.  Will get help right away if you are not doing well or get worse. Document Released: 11/20/2012 Document Reviewed: 09/11/2012 [facility] Patient Information 2014 [place].

## 2013-07-31 DIAGNOSIS — D571 Sickle-cell disease without crisis: Secondary | ICD-10-CM

## 2013-07-31 LAB — CBC
HCT: 22.2 % — ABNORMAL LOW (ref 39.0–52.0)
Hemoglobin: 8 g/dL — ABNORMAL LOW (ref 13.0–17.0)
MCH: 37.2 pg — AB (ref 26.0–34.0)
MCHC: 36 g/dL (ref 30.0–36.0)
MCV: 103.3 fL — AB (ref 78.0–100.0)
PLATELETS: 162 10*3/uL (ref 150–400)
RBC: 2.15 MIL/uL — ABNORMAL LOW (ref 4.22–5.81)
WBC: 11 10*3/uL — ABNORMAL HIGH (ref 4.0–10.5)

## 2013-07-31 MED ORDER — DICLOFENAC SODIUM 75 MG PO TBEC: 75.0000 mg | DELAYED_RELEASE_TABLET | Freq: Two times a day (BID) | ORAL | Status: DC

## 2013-07-31 NOTE — Progress Notes (Signed)
Per Lab, pt refusing anymore labs draws. He states there is no point because he is going home today.

## 2013-07-31 NOTE — Progress Notes (Signed)
[name] [name]. to be D/C'd Home per MD order.  Discussed with the patient and all questions fully answered.    Medication List    STOP taking these medications       naproxen sodium 220 MG tablet  Commonly known as:  ANAPROX      TAKE these medications       diclofenac 75 MG EC tablet  Commonly known as:  VOLTAREN  Take 1 tablet (75 mg total) by mouth 2 (two) times daily.     enalapril 5 MG tablet  Commonly known as:  VASOTEC  Take 5 mg by mouth daily.     hydroxyurea 500 MG capsule  Commonly known as:  HYDREA  Take 3 capsules (1,500 mg total) by mouth daily.     nortriptyline 25 MG capsule  Commonly known as:  PAMELOR  Take 1 capsule (25 mg total) by mouth at bedtime.        VVS, Skin clean, dry and intact without evidence of skin break down, no evidence of skin tears noted. IV catheter discontinued intact. Site without signs and symptoms of complications. Dressing and pressure applied.  An After Visit Summary was printed and given to the patient.  D/c education completed with patient/family including follow up instructions, medication list, d/c activities limitations if indicated, with other d/c instructions as indicated by MD - patient able to verbalize understanding, all questions fully answered.   Patient instructed to return to ED, call 911, or call MD for any changes in condition.   Patient escorted via [place], and D/C home via private auto.  Audria Nine F 07/31/2013 2:17 PM

## 2013-07-31 NOTE — Progress Notes (Signed)
FMTS Attending Daily Note:  Annabell Sabal MD  [phone] pager  Family Practice pager:  [phone] I have seen and examined this patient and have reviewed their chart. I have discussed this patient with the resident. I agree with the resident's findings, assessment and care plan.  Additionally:  Much improved.  Minor pain but can be controlled with OTC analgesics.  Able to DC home today.   Alveda Reasons, MD 07/31/2013 4:38 PM

## 2013-07-31 NOTE — Care Management Note (Signed)
    Page 1 of 1   07/31/2013     3:15:07 PM CARE MANAGEMENT NOTE 07/31/2013  Patient:  [name] [name], [name] [name]   Account Number:  [id number]  Date Initiated:  07/31/2013  Documentation initiated by:  Tomi Bamberger  Subjective/Objective Assessment:   dx sickle cell  admit-from home.     Action/Plan:   Anticipated DC Date:  07/31/2013   Anticipated DC Plan:  [place]  CM consult      Choice offered to / List presented to:             Status of service:  Completed, signed off Medicare Important Message given?   (If response is "NO", the following Medicare IM given date fields will be blank) Date Medicare IM given:   Date Additional Medicare IM given:    Discharge Disposition:  HOME/SELF CARE  Per UR Regulation:  Reviewed for med. necessity/level of care/duration of stay  If discussed at [place] of Stay Meetings, dates discussed:    Comments:
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
## 2013-08-04 ENCOUNTER — Inpatient Hospital Stay: Payer: Medicare Other | Admitting: Family Medicine

## 2013-08-06 NOTE — Discharge Summary (Signed)
Family Medicine Teaching Service  Discharge Note : Attending Jeff Walden MD Pager [phone] Inpatient Team Pager:  [phone]  I have reviewed this patient and the patient's chart and have discussed discharge planning with the resident at the time of discharge. I agree with the discharge plan as above.    

## 2013-09-22 ENCOUNTER — Ambulatory Visit (INDEPENDENT_AMBULATORY_CARE_PROVIDER_SITE_OTHER): Payer: Medicare Other | Admitting: Family Medicine

## 2013-09-22 ENCOUNTER — Encounter: Payer: Self-pay | Admitting: Family Medicine

## 2013-09-22 VITALS — BP 104/66 | HR 102 | Temp 98.7°F | Wt 126.0 lb

## 2013-09-22 DIAGNOSIS — D571 Sickle-cell disease without crisis: Secondary | ICD-10-CM | POA: Diagnosis not present

## 2013-09-22 DIAGNOSIS — N182 Chronic kidney disease, stage 2 (mild): Secondary | ICD-10-CM

## 2013-09-22 DIAGNOSIS — D5700 Hb-SS disease with crisis, unspecified: Secondary | ICD-10-CM | POA: Diagnosis not present

## 2013-09-22 DIAGNOSIS — G43109 Migraine with aura, not intractable, without status migrainosus: Secondary | ICD-10-CM | POA: Diagnosis present

## 2013-09-22 MED ORDER — ENALAPRIL MALEATE 5 MG PO TABS: 5.0000 mg | ORAL_TABLET | Freq: Every day | ORAL | Status: DC

## 2013-09-22 MED ORDER — HYDROXYUREA 500 MG PO CAPS: 1500.0000 mg | ORAL_CAPSULE | Freq: Every day | ORAL | Status: DC

## 2013-09-22 MED ORDER — KETOROLAC TROMETHAMINE 60 MG/2ML IM SOLN: 60.0000 mg | Freq: Once | INTRAMUSCULAR | Status: DC

## 2013-09-22 MED ORDER — KETOROLAC TROMETHAMINE 60 MG/2ML IM SOLN
60.0000 mg | Freq: Once | INTRAMUSCULAR | Status: AC
  Administered 2013-09-22: 60 mg via INTRAMUSCULAR

## 2013-09-22 MED ORDER — TRAMADOL HCL 50 MG PO TABS: 50.0000 mg | ORAL_TABLET | Freq: Three times a day (TID) | ORAL | Status: DC | PRN

## 2013-09-22 MED ORDER — NORTRIPTYLINE HCL 25 MG PO CAPS: 25.0000 mg | ORAL_CAPSULE | Freq: Every day | ORAL | Status: DC

## 2013-09-22 MED ORDER — DICLOFENAC SODIUM 75 MG PO TBEC: 75.0000 mg | DELAYED_RELEASE_TABLET | Freq: Two times a day (BID) | ORAL | Status: DC

## 2013-09-22 NOTE — Patient Instructions (Signed)
Sickle Cell Anemia, Adult °Sickle cell anemia is a condition in which red blood cells have an abnormal "sickle" shape. This abnormal shape shortens the cells' life span, which results in a lower than normal concentration of red blood cells in the blood. The sickle shape also causes the cells to clump together and block free blood flow through the blood vessels. As a result, the tissues and organs of the body do not receive enough oxygen. Sickle cell anemia causes organ damage and pain and increases the risk of infection. °CAUSES  °Sickle cell anemia is a genetic disorder. Those who receive two copies of the gene have the condition, and those who receive one copy have the trait. °RISK FACTORS °The sickle cell gene is most common in people whose families originated in [place]. Other areas of the globe where sickle cell trait occurs include the Mediterranean, [place] and [place], the [place], and the [place].  °SIGNS AND SYMPTOMS °· Pain, especially in the extremities, back, chest, or abdomen (common). The pain may start suddenly or may develop following an illness, especially if there is dehydration. Pain can also occur due to overexertion or exposure to extreme temperature changes. °· Frequent severe bacterial infections, especially certain types of pneumonia and meningitis. °· Pain and swelling in the hands and feet. °· Decreased activity.   °· Loss of appetite.   °· Change in behavior. °· Headaches. °· Seizures. °· Shortness of breath or difficulty breathing. °· Vision changes. °· Skin ulcers. °Those with the trait may not have symptoms or they may have mild symptoms.  °DIAGNOSIS  °Sickle cell anemia is diagnosed with blood tests that demonstrate the genetic trait. It is often diagnosed during the newborn period, due to mandatory testing nationwide. A variety of blood tests, X-rays, CT scans, MRI scans, ultrasounds, and lung function tests may also be done to monitor the condition. °TREATMENT  °Sickle  cell anemia may be treated with: °· Medicines. You may be given pain medicines, antibiotic medicines (to treat and prevent infections) or medicines to increase the production of certain types of hemoglobin. °· Fluids. °· Oxygen. °· Blood transfusions. °HOME CARE INSTRUCTIONS  °· Drink enough fluid to keep your urine clear or pale yellow. Increase your fluid intake in hot weather and during exercise. °· Do not smoke. Smoking lowers oxygen levels in the blood.   °· Only take over-the-counter or prescription medicines for pain, fever, or discomfort as directed by your health care provider. °· Take antibiotics as directed by your health care provider. Make sure you finish them it even if you start to feel better.   °· Take supplements as directed by your health care provider.   °· Consider wearing a medical alert bracelet. This tells anyone caring for you in an emergency of your condition.   °· When traveling, keep your medical information, health care provider's names, and the medicines you take with you at all times.   °· If you develop a fever, do not take medicines to reduce the fever right away. This could cover up a problem that is developing. Notify your health care provider. °· Keep all follow-up appointments with your health care provider. Sickle cell anemia requires regular medical care. °SEEK MEDICAL CARE IF: ° You have a fever. °SEEK IMMEDIATE MEDICAL CARE IF:  °· You feel dizzy or faint.   °· You have new abdominal pain, especially on the left side near the stomach area.   °· You develop a persistent, often uncomfortable and painful penile erection (priapism). If this is not treated immediately it   will lead to impotence.   °· You have numbness your arms or legs or you have a hard time moving them.   °· You have a hard time with speech.   °· You have a fever or persistent symptoms for more than 2-3 days.   °· You have a fever and your symptoms suddenly get worse.   °· You have signs or symptoms of infection.  These include:   °¨ Chills.   °¨ Abnormal tiredness (lethargy).   °¨ Irritability.   °¨ Poor eating.   °¨ Vomiting.   °· You develop pain that is not helped with medicine.   °· You develop shortness of breath. °· You have pain in your chest.   °· You are coughing up pus-like or bloody sputum.   °· You develop a stiff neck. °· Your feet or hands swell or have pain. °· Your abdomen appears bloated. °· You develop joint pain. °MAKE SURE YOU: °· Understand these instructions. °Document Released: 05/10/2005 Document Revised: 06/16/2013 Document Reviewed: 09/11/2012 °ExitCare® Patient Information ©2015 [employer]. This information is not intended to replace advice given to you by your health care provider. Make sure you discuss any questions you have with your health care provider. ° °

## 2013-09-22 NOTE — Assessment & Plan Note (Signed)
Referral back to [facility] refilled tramadol and NSAIDS.  IM Toradol today.  Most recent kidney function ok for short interval NSAIDS.

## 2013-09-22 NOTE — Progress Notes (Signed)
    Subjective:    Patient ID: [name] [name]. is a 33 y.o. male presenting with sickle cell and Medication Refill  on 09/22/2013  HPI: Began over the weekend having pain and leg pain with changing temperatures, bone pain in both arms and legs. Has been on Tramadol from [place] but has been unable to get to [facility] due to transportation issues.   Review of Systems  Constitutional: Negative for fever and chills.  Respiratory: Negative for shortness of breath.   Cardiovascular: Negative for chest pain.  Gastrointestinal: Negative for abdominal pain.      Objective:    BP 104/66  Pulse 102  Temp(Src) 98.7 F (37.1 C) (Oral)  Wt 126 lb (57.153 kg) Physical Exam  Vitals reviewed. Constitutional: He appears well-developed and well-nourished. No distress.  HENT:  Head: Normocephalic.  Eyes: No scleral icterus.  Neck: Neck supple.  Cardiovascular: Normal rate.   Pulmonary/Chest: Effort normal.  Abdominal: Soft.  Neurological: He is alert.        Assessment & Plan:   Problem List Items Addressed This Visit     Unprioritized   Sickle cell disease, type SS. No outpatient prescriptions from [facility] due to cocaine positive, opiate negative on high doses of given narcotics   Relevant Medications      hydroxyurea (HYDREA) capsule 500 mg   Migraine - Primary   Relevant Medications      nortriptyline (PAMELOR) capsule      diclofenac (VOLTAREN) EC tablet      enalapril (VASOTEC) tablet      traMADol (ULTRAM) tablet 50 mg      ketorolac (TORADOL) injection 60 mg (Completed)   CHRONIC KIDNEY DISEASE STAGE II (MILD)   Sickle cell pain crisis     Referral back to [facility] refilled tramadol and NSAIDS.  IM Toradol today.  Most recent kidney function ok for short interval NSAIDS.    Relevant Medications      ketorolac (TORADOL) injection 60 mg (Completed)   Other Relevant Orders      CBC      Return in about 3 months (around 12/23/2013).

## 2013-09-23 LAB — CBC
HCT: 24.2 % — ABNORMAL LOW (ref 39.0–52.0)
Hemoglobin: 8.9 g/dL — ABNORMAL LOW (ref 13.0–17.0)
MCH: 42.4 pg — ABNORMAL HIGH (ref 26.0–34.0)
MCHC: 36.8 g/dL — AB (ref 30.0–36.0)
MCV: 115.2 fL — AB (ref 78.0–100.0)
PLATELETS: 256 10*3/uL (ref 150–400)
RBC: 2.1 MIL/uL — ABNORMAL LOW (ref 4.22–5.81)
RDW: 25 % — AB (ref 11.5–15.5)
WBC: 10.3 10*3/uL (ref 4.0–10.5)

## 2013-09-29 ENCOUNTER — Ambulatory Visit: Payer: Medicare Other | Admitting: Family Medicine

## 2013-11-12 ENCOUNTER — Other Ambulatory Visit: Payer: Self-pay | Admitting: Family Medicine

## 2013-11-12 ENCOUNTER — Ambulatory Visit (INDEPENDENT_AMBULATORY_CARE_PROVIDER_SITE_OTHER): Payer: Medicare Other | Admitting: Family Medicine

## 2013-11-12 ENCOUNTER — Encounter: Payer: Self-pay | Admitting: Family Medicine

## 2013-11-12 ENCOUNTER — Other Ambulatory Visit (HOSPITAL_COMMUNITY)
Admission: RE | Admit: 2013-11-12 | Discharge: 2013-11-12 | Disposition: A | Discharge status: Home / Self Care | Payer: Medicare Other | Source: Ambulatory Visit | Attending: Family Medicine | Admitting: Family Medicine

## 2013-11-12 VITALS — BP 123/81 | HR 108 | Temp 98.1°F | Ht 63.0 in | Wt 129.8 lb

## 2013-11-12 DIAGNOSIS — Z113 Encounter for screening for infections with a predominantly sexual mode of transmission: Secondary | ICD-10-CM | POA: Insufficient documentation

## 2013-11-12 DIAGNOSIS — D571 Sickle-cell disease without crisis: Secondary | ICD-10-CM

## 2013-11-12 DIAGNOSIS — R5381 Other malaise: Secondary | ICD-10-CM

## 2013-11-12 DIAGNOSIS — R5383 Other fatigue: Secondary | ICD-10-CM

## 2013-11-12 DIAGNOSIS — Z20828 Contact with and (suspected) exposure to other viral communicable diseases: Secondary | ICD-10-CM

## 2013-11-12 DIAGNOSIS — Z7251 High risk heterosexual behavior: Secondary | ICD-10-CM

## 2013-11-12 MED ORDER — KETOROLAC TROMETHAMINE 30 MG/ML IJ SOLN
30.0000 mg | Freq: Once | INTRAMUSCULAR | Status: AC
  Administered 2013-11-12: 30 mg via INTRAMUSCULAR

## 2013-11-12 NOTE — Progress Notes (Signed)
Patient ID: [name] [name]., male   DOB: 08/26/1980, 33 y.o.   MRN: [id number]  HPI:  Pt presents for a same day appointment to discuss fatigue and STD testing.  Patient has a history of sickle cell disease (hemoglobin SS). Reports that recently with the change of weather, he has been feeling a little more fatigued. He would like his hemoglobin checked. He also has some mild aching in his body and would like a shot of Toradol today.  He also requests STD testing. He's had approximately 58 male partners in the last year. He uses condoms most of the time, but not every time. Has a history of chlamydia in the past which was treated. About 5-6 days ago his condom fell off, and thus he is presenting today for STD testing. He denies having any skin lesions around his genitals, also denies discharge from his penis, or dysuria. Denies having any fevers.  ROS: See HPI  [place]: Sickle cell SS disease, mild stage II CKD  PHYSICAL EXAM: BP 123/81  Pulse 108  Temp(Src) 98.1 F (36.7 C) (Oral)  Ht 5\' 3"  (1.6 m)  Wt 129 lb 12.8 oz (58.877 kg)  BMI 23.00 kg/m2 Gen: NAD, pleasant, cooperative HEENT: NCAT, MMM no lesions or exudates Heart: RRR no murmurs Lungs: CTAB, NWOB Abdomen: soft, notender Neuro: grossly nonfocal, speech normal GU: Normal circumcised male genitalia. No discharge from the urethral meatus. Testicles nontender to palpation. No inguinal lymphadenopathy or hernias.  ASSESSMENT/PLAN:  1. Fatigue - mild. Reasonable to check hemoglobin to ensure is at baseline and not early pain criss. Will also give shot of IM toradol 30mg  here in clinic. Recent renal fxn was stable. Advised no NSAIDs orally for 24 hours.  2. STD testing - will check HIV/RPR/hepatitis panel, urine GC, trichomonas, and Chlamydia today. Given handout on safe sex. Also given supply of condoms.  FOLLOW UP: F/u as needed if symptoms worsen or do not improve.   [place]. Ardelia Mems, [place]

## 2013-11-12 NOTE — Patient Instructions (Signed)
Safe Sex Safe sex is about reducing the risk of giving or getting a sexually transmitted disease (STD). STDs are spread through sexual contact involving the genitals, mouth, or rectum. Some STDs can be cured and others cannot. Safe sex can also prevent unintended pregnancies.  WHAT ARE SOME SAFE SEX PRACTICES?  Limit your sexual activity to only one partner who is having sex with only you.  Talk to your partner about his or her past partners, past STDs, and drug use.  Use a condom every time you have sexual intercourse. This includes vaginal, oral, and anal sexual activity. Both females and males should wear condoms during oral sex. Only use latex or polyurethane condoms and water-based lubricants. Using petroleum-based lubricants or oils to lubricate a condom will weaken the condom and increase the chance that it will break. The condom should be in place from the beginning to the end of sexual activity. Wearing a condom reduces, but does not completely eliminate, your risk of getting or giving an STD. STDs can be spread by contact with infected body fluids and skin.  Get vaccinated for hepatitis B and HPV.  Avoid alcohol and recreational drugs, which can affect your judgment. You may forget to use a condom or participate in high-risk sex.  For females, avoid douching after sexual intercourse. Douching can spread an infection farther into the reproductive tract.  Check your body for signs of sores, blisters, rashes, or unusual discharge. See your health care provider if you notice any of these signs.  Avoid sexual contact if you have symptoms of an infection or are being treated for an STD. If you or your partner has herpes, avoid sexual contact when blisters are present. Use condoms at all other times.  If you are at risk of being infected with HIV, it is recommended that you take a prescription medicine daily to prevent HIV infection. This is called pre-exposure prophylaxis (PrEP). You are  considered at risk if:  You are a man who has sex with other men (MSM).  You are a heterosexual man or woman who is sexually active with more than one partner.  You take drugs by injection.  You are sexually active with a partner who has HIV.  Talk with your health care provider about whether you are at high risk of being infected with HIV. If you choose to begin PrEP, you should first be tested for HIV. You should then be tested every 3 months for as long as you are taking PrEP.  See your health care provider for regular screenings, exams, and tests for other STDs. Before having sex with a new partner, each of you should be screened for STDs and should talk about the results with each other. WHAT ARE THE BENEFITS OF SAFE SEX?   There is less chance of getting or giving an STD.  You can prevent unwanted or unintended pregnancies.  By discussing safe sex concerns with your partner, you may increase feelings of intimacy, comfort, trust, and honesty between the two of you. Document Released: 03/09/2004 Document Revised: 06/16/2013 Document Reviewed: 07/24/2011 ExitCare Patient Information 2015 [employer]. This information is not intended to replace advice given to you by your health care provider. Make sure you discuss any questions you have with your health care provider.  

## 2013-11-13 LAB — CBC
HCT: 25.8 % — ABNORMAL LOW (ref 39.0–52.0)
HEMOGLOBIN: 9.2 g/dL — AB (ref 13.0–17.0)
MCH: 44.9 pg — ABNORMAL HIGH (ref 26.0–34.0)
MCHC: 35.7 g/dL (ref 30.0–36.0)
MCV: 125.9 fL — ABNORMAL HIGH (ref 78.0–100.0)
Platelets: 263 10*3/uL (ref 150–400)
RBC: 2.05 MIL/uL — ABNORMAL LOW (ref 4.22–5.81)
RDW: 17.2 % — ABNORMAL HIGH (ref 11.5–15.5)
WBC: 9 10*3/uL (ref 4.0–10.5)

## 2013-11-13 LAB — URINE CYTOLOGY ANCILLARY ONLY
Chlamydia: NEGATIVE
Neisseria Gonorrhea: NEGATIVE
TRICH (WINDOWPATH): NEGATIVE

## 2013-11-13 LAB — HIV ANTIBODY (ROUTINE TESTING W REFLEX): HIV 1&2 Ab, 4th Generation: NONREACTIVE

## 2013-11-13 LAB — HEPATITIS PANEL, ACUTE
HCV Ab: NEGATIVE
HEP A IGM: NONREACTIVE
HEP B S AG: NEGATIVE
Hep B C IgM: NONREACTIVE

## 2013-11-13 LAB — RPR

## 2013-11-17 ENCOUNTER — Telehealth: Payer: Self-pay | Admitting: *Deleted

## 2013-11-17 NOTE — Telephone Encounter (Signed)
Pt informed of tests results. Stanley Helmuth CMA

## 2013-11-17 NOTE — Telephone Encounter (Signed)
Message copied by Junious Dresser on Mon Nov 17, 2013 11:46 AM ------      Message from: Leeanne Rio      Created: Fri Nov 14, 2013  5:02 PM       Red team please inform pt that STD tests were negative and his hemoglobin is at baseline (9.2). HIV for some reason was not drawn (I may have accidentally ordered as "future" order). Recommend he return to have that drawn. Thanks! Leeanne Rio, MD ------

## 2013-12-15 ENCOUNTER — Other Ambulatory Visit: Payer: Self-pay | Admitting: *Deleted

## 2013-12-16 MED ORDER — ENALAPRIL MALEATE 5 MG PO TABS: 5.0000 mg | ORAL_TABLET | Freq: Every day | ORAL | Status: DC

## 2013-12-25 ENCOUNTER — Encounter (HOSPITAL_COMMUNITY): Payer: Self-pay | Admitting: *Deleted

## 2013-12-25 ENCOUNTER — Inpatient Hospital Stay (HOSPITAL_COMMUNITY)
Admission: EM | Admit: 2013-12-25 | Discharge: 2013-12-29 | DRG: 812 | Disposition: A | Discharge status: Home / Self Care | Payer: Medicare Other | Attending: Family Medicine | Admitting: Family Medicine

## 2013-12-25 DIAGNOSIS — E875 Hyperkalemia: Secondary | ICD-10-CM | POA: Diagnosis present

## 2013-12-25 DIAGNOSIS — D5700 Hb-SS disease with crisis, unspecified: Secondary | ICD-10-CM | POA: Diagnosis not present

## 2013-12-25 DIAGNOSIS — E871 Hypo-osmolality and hyponatremia: Secondary | ICD-10-CM | POA: Diagnosis present

## 2013-12-25 DIAGNOSIS — K5900 Constipation, unspecified: Secondary | ICD-10-CM | POA: Diagnosis present

## 2013-12-25 DIAGNOSIS — Z886 Allergy status to analgesic agent status: Secondary | ICD-10-CM

## 2013-12-25 DIAGNOSIS — D508 Other iron deficiency anemias: Secondary | ICD-10-CM | POA: Insufficient documentation

## 2013-12-25 DIAGNOSIS — M545 Low back pain: Secondary | ICD-10-CM | POA: Diagnosis present

## 2013-12-25 DIAGNOSIS — D509 Iron deficiency anemia, unspecified: Secondary | ICD-10-CM | POA: Diagnosis present

## 2013-12-25 DIAGNOSIS — Z87891 Personal history of nicotine dependence: Secondary | ICD-10-CM

## 2013-12-25 DIAGNOSIS — N179 Acute kidney failure, unspecified: Secondary | ICD-10-CM | POA: Diagnosis present

## 2013-12-25 DIAGNOSIS — N182 Chronic kidney disease, stage 2 (mild): Secondary | ICD-10-CM | POA: Diagnosis present

## 2013-12-25 DIAGNOSIS — G43909 Migraine, unspecified, not intractable, without status migrainosus: Secondary | ICD-10-CM | POA: Diagnosis present

## 2013-12-25 DIAGNOSIS — R0600 Dyspnea, unspecified: Secondary | ICD-10-CM

## 2013-12-25 DIAGNOSIS — F1410 Cocaine abuse, uncomplicated: Secondary | ICD-10-CM | POA: Diagnosis present

## 2013-12-25 DIAGNOSIS — K219 Gastro-esophageal reflux disease without esophagitis: Secondary | ICD-10-CM | POA: Diagnosis present

## 2013-12-25 LAB — URINALYSIS, ROUTINE W REFLEX MICROSCOPIC
Bilirubin Urine: NEGATIVE
Glucose, UA: NEGATIVE mg/dL
Hgb urine dipstick: NEGATIVE
Ketones, ur: NEGATIVE mg/dL
LEUKOCYTES UA: NEGATIVE
NITRITE: NEGATIVE
PH: 5.5 (ref 5.0–8.0)
Protein, ur: NEGATIVE mg/dL
SPECIFIC GRAVITY, URINE: 1.009 (ref 1.005–1.030)
Urobilinogen, UA: 0.2 mg/dL (ref 0.0–1.0)

## 2013-12-25 LAB — I-STAT CHEM 8, ED
BUN: 39 mg/dL — ABNORMAL HIGH (ref 6–23)
CHLORIDE: 112 meq/L (ref 96–112)
Calcium, Ion: 1.21 mmol/L (ref 1.12–1.23)
Creatinine, Ser: 2.2 mg/dL — ABNORMAL HIGH (ref 0.50–1.35)
GLUCOSE: 129 mg/dL — AB (ref 70–99)
HCT: 23 % — ABNORMAL LOW (ref 39.0–52.0)
Hemoglobin: 7.8 g/dL — ABNORMAL LOW (ref 13.0–17.0)
POTASSIUM: 4.4 meq/L (ref 3.7–5.3)
Sodium: 136 mEq/L — ABNORMAL LOW (ref 137–147)
TCO2: 14 mmol/L (ref 0–100)

## 2013-12-25 LAB — CBC WITH DIFFERENTIAL/PLATELET
BASOS ABS: 0 10*3/uL (ref 0.0–0.1)
BASOS PCT: 0 % (ref 0–1)
Eosinophils Absolute: 0.1 10*3/uL (ref 0.0–0.7)
Eosinophils Relative: 1 % (ref 0–5)
HEMATOCRIT: 21.5 % — AB (ref 39.0–52.0)
HEMOGLOBIN: 7.8 g/dL — AB (ref 13.0–17.0)
LYMPHS ABS: 5.1 10*3/uL — AB (ref 0.7–4.0)
LYMPHS PCT: 39 % (ref 12–46)
MCH: 42.4 pg — AB (ref 26.0–34.0)
MCHC: 36.3 g/dL — AB (ref 30.0–36.0)
MCV: 116.8 fL — ABNORMAL HIGH (ref 78.0–100.0)
MONO ABS: 1.2 10*3/uL — AB (ref 0.1–1.0)
Monocytes Relative: 9 % (ref 3–12)
NEUTROS ABS: 6.8 10*3/uL (ref 1.7–7.7)
Neutrophils Relative %: 51 % (ref 43–77)
Platelets: 224 10*3/uL (ref 150–400)
RBC: 1.84 MIL/uL — ABNORMAL LOW (ref 4.22–5.81)
RDW: 17.7 % — AB (ref 11.5–15.5)
WBC: 13.2 10*3/uL — ABNORMAL HIGH (ref 4.0–10.5)

## 2013-12-25 LAB — RAPID URINE DRUG SCREEN, HOSP PERFORMED
Amphetamines: NOT DETECTED
BENZODIAZEPINES: NOT DETECTED
Barbiturates: NOT DETECTED
COCAINE: POSITIVE — AB
Opiates: NOT DETECTED
Tetrahydrocannabinol: POSITIVE — AB

## 2013-12-25 MED ORDER — SODIUM CHLORIDE 0.9 % IV BOLUS (SEPSIS)
1000.0000 mL | Freq: Once | INTRAVENOUS | Status: AC
  Administered 2013-12-25: 1000 mL via INTRAVENOUS

## 2013-12-25 MED ORDER — ONDANSETRON HCL 4 MG/2ML IJ SOLN
4.0000 mg | Freq: Once | INTRAMUSCULAR | Status: AC
  Administered 2013-12-25: 4 mg via INTRAVENOUS
  Filled 2013-12-25: qty 2

## 2013-12-25 MED ORDER — HYDROMORPHONE HCL 1 MG/ML IJ SOLN
1.0000 mg | Freq: Once | INTRAMUSCULAR | Status: AC
  Administered 2013-12-25: 1 mg via INTRAVENOUS
  Filled 2013-12-25: qty 1

## 2013-12-25 MED ORDER — KETOROLAC TROMETHAMINE 30 MG/ML IJ SOLN
30.0000 mg | Freq: Once | INTRAMUSCULAR | Status: AC
  Administered 2013-12-25: 30 mg via INTRAVENOUS
  Filled 2013-12-25: qty 1

## 2013-12-25 MED ORDER — HYDROMORPHONE HCL 1 MG/ML IJ SOLN
1.0000 mg | INTRAMUSCULAR | Status: AC | PRN
  Administered 2013-12-25 – 2013-12-26 (×3): 1 mg via INTRAVENOUS
  Filled 2013-12-25 (×3): qty 1

## 2013-12-25 NOTE — ED Provider Notes (Signed)
CSN: [id number]     Arrival date & time 12/25/13  2056 History   First MD Initiated Contact with Patient 12/25/13 2136     Chief Complaint  Patient presents with  . Sickle Cell Pain Crisis     (Consider location/radiation/quality/duration/timing/severity/associated sxs/prior Treatment) Patient is a 33 y.o. male presenting with sickle cell pain. The history is provided by the patient.  Sickle Cell Pain Crisis Location:  Back and lower extremity Severity:  Moderate Onset quality:  Gradual Duration:  2 days Similar to previous crisis episodes: yes   Timing:  Constant Progression:  Worsening Chronicity:  Recurrent History of pulmonary emboli: no   Context: not cold exposure, not dehydration and not infection   Relieved by:  None tried Worsened by:  Nothing tried Associated symptoms: nausea   Associated symptoms: no chest pain, no congestion, no cough, no fatigue, no fever, no headaches, no leg ulcers, no shortness of breath and no vomiting   Risk factors: cholecystectomy and renal disease   Risk factors: no hx of pneumonia, no history of acute chest syndrome and no recent air travel     Past Medical History  Diagnosis Date  . Sickle cell anemia   . Sickle cell crisis 09/25/2012  . Bacterial pneumonia ~ 2012    "caught it here in the hospital" (09/25/2012)  . History of blood transfusion     "always related to sickle cell crisis" (09/25/2012)  . GERD (gastroesophageal reflux disease)     "after I eat alot of spicey foods" (09/25/2012)  . Migraines     "take RX qd to prevent them" (09/25/2012)  . Cyst of brain     "2 really small ones in the back of my head; inside; saw them w/MRI" (09/25/2012)  . Chronic kidney disease     "from my sickle cell" (09/25/2012)   Past Surgical History  Procedure Laterality Date  . Cholecystectomy  ~ 2012   No family history on file. History  Substance Use Topics  . Smoking status: Former Smoker    Types: Cigarettes  . Smokeless tobacco: Never  Used     Comment: 09/25/2012 "I don't buy cigarettes; bum one from friends q now and then"  . Alcohol Use: Yes     Comment: occasionally    Review of Systems  Constitutional: Negative for fever and fatigue.  HENT: Negative for congestion.   Respiratory: Negative for cough and shortness of breath.   Cardiovascular: Negative for chest pain and leg swelling.  Gastrointestinal: Positive for nausea. Negative for vomiting.  Genitourinary: Negative for dysuria.  Musculoskeletal: Positive for back pain and arthralgias. Negative for joint swelling.  Skin: Negative for rash.  Neurological: Negative for headaches.  All other systems reviewed and are negative.     Allergies  Morphine and related  Home Medications   Prior to Admission medications   Medication Sig Start Date End Date Taking? Authorizing Provider  diclofenac (VOLTAREN) 75 MG EC tablet Take 1 tablet (75 mg total) by mouth 2 (two) times daily. 09/22/13  Yes Donnamae Jude, MD  enalapril (VASOTEC) 5 MG tablet Take 1 tablet (5 mg total) by mouth daily. 12/16/13  Yes Leone Brand, MD  hydroxyurea (HYDREA) 500 MG capsule Take 3 capsules (1,500 mg total) by mouth daily. 09/22/13  Yes Donnamae Jude, MD  traMADol (ULTRAM) 50 MG tablet Take 1 tablet (50 mg total) by mouth every 8 (eight) hours as needed. 09/22/13  Yes Donnamae Jude, MD  nortriptyline (PAMELOR) 25  MG capsule Take 1 capsule (25 mg total) by mouth at bedtime. 09/22/13   Donnamae Jude, MD   BP 111/64 mmHg  Pulse 112  Temp(Src) 98.6 F (37 C) (Oral)  Resp 16  Ht 5\' 3"  (1.6 m)  Wt 129 lb (58.514 kg)  BMI 22.86 kg/m2  SpO2 97% Physical Exam  Constitutional: He is oriented to person, place, and time. He appears well-developed and well-nourished.  HENT:  Head: Normocephalic.  Mouth/Throat: Oropharynx is clear and moist.  Eyes: Pupils are equal, round, and reactive to light.  Neck: Normal range of motion.  Cardiovascular: Regular rhythm.  Tachycardia present.    Pulmonary/Chest: Effort normal. No respiratory distress. He exhibits no tenderness.  Abdominal: Soft. He exhibits no distension. There is no tenderness.  Musculoskeletal: Normal range of motion. He exhibits tenderness. He exhibits no edema.  Lymphadenopathy:    He has no cervical adenopathy.  Neurological: He is alert and oriented to person, place, and time.  Skin: Skin is warm and dry. No rash noted.  Psychiatric: He has a normal mood and affect.  Nursing note and vitals reviewed.   ED Course  Procedures (including critical care time) Labs Review Labs Reviewed  CBC WITH DIFFERENTIAL - Abnormal; Notable for the following:    WBC 13.2 (*)    RBC 1.84 (*)    Hemoglobin 7.8 (*)    HCT 21.5 (*)    MCV 116.8 (*)    MCH 42.4 (*)    MCHC 36.3 (*)    RDW 17.7 (*)    Lymphs Abs 5.1 (*)    Monocytes Absolute 1.2 (*)    All other components within normal limits  URINALYSIS, ROUTINE W REFLEX MICROSCOPIC - Abnormal; Notable for the following:    APPearance CLOUDY (*)    All other components within normal limits  URINE RAPID DRUG SCREEN (HOSP PERFORMED) - Abnormal; Notable for the following:    Cocaine POSITIVE (*)    Tetrahydrocannabinol POSITIVE (*)    All other components within normal limits  I-STAT CHEM 8, ED - Abnormal; Notable for the following:    Sodium 136 (*)    BUN 39 (*)    Creatinine, Ser 2.20 (*)    Glucose, Bld 129 (*)    Hemoglobin 7.8 (*)    HCT 23.0 (*)    All other components within normal limits    Imaging Review No results found.   EKG Interpretation None     Despite 3 mg Dilaudid and 2 liters fluids pain still 8/10  Spoke with Family Practice who will admit  MDM   Final diagnoses:  Sickle cell anemia with crisis         Garald Balding, NP 12/26/13 [id number]  Virgel Manifold, MD 01/01/14 [phone]

## 2013-12-25 NOTE — ED Notes (Signed)
Patient presents with c/o sickle cell pain   Pain is worse legs bilaterally and lower back

## 2013-12-26 ENCOUNTER — Inpatient Hospital Stay (HOSPITAL_COMMUNITY): Payer: Medicare Other

## 2013-12-26 DIAGNOSIS — D508 Other iron deficiency anemias: Secondary | ICD-10-CM | POA: Diagnosis not present

## 2013-12-26 DIAGNOSIS — K219 Gastro-esophageal reflux disease without esophagitis: Secondary | ICD-10-CM | POA: Diagnosis present

## 2013-12-26 DIAGNOSIS — M545 Low back pain: Secondary | ICD-10-CM | POA: Diagnosis present

## 2013-12-26 DIAGNOSIS — N182 Chronic kidney disease, stage 2 (mild): Secondary | ICD-10-CM | POA: Diagnosis present

## 2013-12-26 DIAGNOSIS — N179 Acute kidney failure, unspecified: Secondary | ICD-10-CM | POA: Diagnosis present

## 2013-12-26 DIAGNOSIS — D5700 Hb-SS disease with crisis, unspecified: Secondary | ICD-10-CM | POA: Diagnosis not present

## 2013-12-26 DIAGNOSIS — Z886 Allergy status to analgesic agent status: Secondary | ICD-10-CM | POA: Diagnosis not present

## 2013-12-26 DIAGNOSIS — Z87891 Personal history of nicotine dependence: Secondary | ICD-10-CM | POA: Diagnosis not present

## 2013-12-26 DIAGNOSIS — F1410 Cocaine abuse, uncomplicated: Secondary | ICD-10-CM | POA: Diagnosis present

## 2013-12-26 DIAGNOSIS — N183 Chronic kidney disease, stage 3 (moderate): Secondary | ICD-10-CM

## 2013-12-26 DIAGNOSIS — K5900 Constipation, unspecified: Secondary | ICD-10-CM | POA: Diagnosis present

## 2013-12-26 DIAGNOSIS — E875 Hyperkalemia: Secondary | ICD-10-CM | POA: Diagnosis present

## 2013-12-26 DIAGNOSIS — G43909 Migraine, unspecified, not intractable, without status migrainosus: Secondary | ICD-10-CM | POA: Diagnosis present

## 2013-12-26 DIAGNOSIS — E871 Hypo-osmolality and hyponatremia: Secondary | ICD-10-CM | POA: Diagnosis present

## 2013-12-26 DIAGNOSIS — D509 Iron deficiency anemia, unspecified: Secondary | ICD-10-CM | POA: Diagnosis present

## 2013-12-26 LAB — RETICULOCYTES
RBC.: 1.92 MIL/uL — ABNORMAL LOW (ref 4.22–5.81)
RETIC CT PCT: 9 % — AB (ref 0.4–3.1)
Retic Count, Absolute: 172.8 10*3/uL (ref 19.0–186.0)

## 2013-12-26 LAB — CBC
HEMATOCRIT: 19.5 % — AB (ref 39.0–52.0)
HEMOGLOBIN: 7.2 g/dL — AB (ref 13.0–17.0)
MCH: 42.6 pg — ABNORMAL HIGH (ref 26.0–34.0)
MCHC: 36.9 g/dL — ABNORMAL HIGH (ref 30.0–36.0)
MCV: 115.4 fL — ABNORMAL HIGH (ref 78.0–100.0)
Platelets: 219 10*3/uL (ref 150–400)
RBC: 1.69 MIL/uL — AB (ref 4.22–5.81)
RDW: 19.2 % — ABNORMAL HIGH (ref 11.5–15.5)
WBC: 14.5 10*3/uL — AB (ref 4.0–10.5)

## 2013-12-26 MED ORDER — HYDROMORPHONE HCL 1 MG/ML IJ SOLN: 1.0000 mg | Freq: Once | INTRAMUSCULAR | Status: DC

## 2013-12-26 MED ORDER — DIPHENHYDRAMINE HCL 12.5 MG/5ML PO ELIX
12.5000 mg | ORAL_SOLUTION | Freq: Four times a day (QID) | ORAL | Status: DC | PRN
  Filled 2013-12-26: qty 5

## 2013-12-26 MED ORDER — HYDROMORPHONE 0.3 MG/ML IV SOLN
INTRAVENOUS | Status: DC
  Administered 2013-12-26: 2.4 mg via INTRAVENOUS
  Administered 2013-12-26 (×2): via INTRAVENOUS
  Administered 2013-12-26: 9 mL via INTRAVENOUS
  Administered 2013-12-27: 0.3 mg via INTRAVENOUS
  Administered 2013-12-27: 5 mg via INTRAVENOUS
  Filled 2013-12-26 (×2): qty 25

## 2013-12-26 MED ORDER — ONDANSETRON HCL 4 MG/2ML IJ SOLN: 4.0000 mg | Freq: Four times a day (QID) | INTRAMUSCULAR | Status: DC | PRN

## 2013-12-26 MED ORDER — HYDROXYUREA 500 MG PO CAPS
1500.0000 mg | ORAL_CAPSULE | Freq: Every day | ORAL | Status: DC
  Administered 2013-12-26 – 2013-12-29 (×4): 1500 mg via ORAL
  Filled 2013-12-26 (×4): qty 3

## 2013-12-26 MED ORDER — HEPARIN SODIUM (PORCINE) 5000 UNIT/ML IJ SOLN
5000.0000 [IU] | Freq: Three times a day (TID) | INTRAMUSCULAR | Status: DC
  Administered 2013-12-26 – 2013-12-29 (×10): 5000 [IU] via SUBCUTANEOUS
  Filled 2013-12-26 (×12): qty 1

## 2013-12-26 MED ORDER — DIPHENHYDRAMINE HCL 50 MG/ML IJ SOLN: 12.5000 mg | Freq: Four times a day (QID) | INTRAMUSCULAR | Status: DC | PRN

## 2013-12-26 MED ORDER — NORTRIPTYLINE HCL 25 MG PO CAPS
50.0000 mg | ORAL_CAPSULE | ORAL | Status: AC
  Administered 2013-12-26: 50 mg via ORAL
  Filled 2013-12-26: qty 2

## 2013-12-26 MED ORDER — NALOXONE HCL 0.4 MG/ML IJ SOLN: 0.4000 mg | INTRAMUSCULAR | Status: DC | PRN

## 2013-12-26 MED ORDER — HYDROMORPHONE 0.3 MG/ML IV SOLN: INTRAVENOUS | Status: DC

## 2013-12-26 MED ORDER — NORTRIPTYLINE HCL 25 MG PO CAPS
25.0000 mg | ORAL_CAPSULE | Freq: Every day | ORAL | Status: DC
Start: 2013-12-26 — End: 2013-12-26
  Filled 2013-12-26: qty 1

## 2013-12-26 MED ORDER — FENTANYL 10 MCG/ML IV SOLN
INTRAVENOUS | Status: DC
  Administered 2013-12-26: 11:00:00 via INTRAVENOUS
  Administered 2013-12-26: 15 mL via INTRAVENOUS
  Filled 2013-12-26: qty 50

## 2013-12-26 MED ORDER — ACETAMINOPHEN 325 MG PO TABS
650.0000 mg | ORAL_TABLET | Freq: Four times a day (QID) | ORAL | Status: DC | PRN
  Administered 2013-12-26: 650 mg via ORAL
  Filled 2013-12-26: qty 2

## 2013-12-26 MED ORDER — SODIUM CHLORIDE 0.9 % IJ SOLN: 9.0000 mL | INTRAMUSCULAR | Status: DC | PRN

## 2013-12-26 MED ORDER — SODIUM CHLORIDE 0.9 % IV SOLN
250.0000 mL | INTRAVENOUS | Status: DC | PRN
  Administered 2013-12-27: 250 mL via INTRAVENOUS

## 2013-12-26 MED ORDER — SODIUM CHLORIDE 0.9 % IV SOLN
INTRAVENOUS | Status: AC
  Administered 2013-12-26: 05:00:00 via INTRAVENOUS

## 2013-12-26 MED ORDER — SODIUM CHLORIDE 0.9 % IJ SOLN: 3.0000 mL | INTRAMUSCULAR | Status: DC | PRN

## 2013-12-26 MED ORDER — OXYCODONE HCL 5 MG PO TABS
5.0000 mg | ORAL_TABLET | Freq: Once | ORAL | Status: AC
  Administered 2013-12-26: 5 mg via ORAL
  Filled 2013-12-26: qty 1

## 2013-12-26 MED ORDER — SODIUM CHLORIDE 0.9 % IJ SOLN
3.0000 mL | Freq: Two times a day (BID) | INTRAMUSCULAR | Status: DC
  Administered 2013-12-26 – 2013-12-29 (×4): 3 mL via INTRAVENOUS

## 2013-12-26 MED ORDER — NORTRIPTYLINE HCL 25 MG PO CAPS
50.0000 mg | ORAL_CAPSULE | Freq: Every day | ORAL | Status: DC
  Administered 2013-12-26 – 2013-12-28 (×3): 50 mg via ORAL
  Filled 2013-12-26 (×4): qty 2

## 2013-12-26 MED ORDER — ONDANSETRON HCL 4 MG PO TABS: 4.0000 mg | ORAL_TABLET | Freq: Four times a day (QID) | ORAL | Status: DC | PRN

## 2013-12-26 MED ORDER — SODIUM CHLORIDE 0.9 % IJ SOLN
3.0000 mL | Freq: Two times a day (BID) | INTRAMUSCULAR | Status: DC
  Administered 2013-12-29: 3 mL via INTRAVENOUS

## 2013-12-26 MED ORDER — HYDROMORPHONE HCL 1 MG/ML IJ SOLN
1.0000 mg | INTRAMUSCULAR | Status: DC | PRN
  Administered 2013-12-26 (×3): 1 mg via INTRAVENOUS
  Filled 2013-12-26 (×3): qty 1

## 2013-12-26 NOTE — Progress Notes (Signed)
Utilization review completed.  

## 2013-12-26 NOTE — Plan of Care (Signed)
Problem: Phase I Progression Outcomes Goal: Pt. aware of pain management plan Outcome: Completed/Met Date Met:  12/26/13

## 2013-12-26 NOTE — ED Notes (Signed)
Patient transported to X-ray 

## 2013-12-26 NOTE — H&P (Signed)
[facility] Admission History and Physical Service Pager: [phone]  Patient name: [name] [name]. Medical record number: [id number] Date of birth: Apr 01, 1980 Age: 33 y.o. Gender: male  Primary Care Provider: Tawanna Sat, MD Consultants: none Code Status: full  Chief Complaint: bilateral leg and low back pain  Assessment and Plan: Jeffrie Lofstrom. is a 32 y.o. male presenting with sickle cell pain crisis . PMH is significant for sickle cell SS disease, CKD stage 2, cocaine abuse, andtobacco abuse.  Sickle cell pain crisis - pain consistent with his typical sickle cell pain crisis with bilateral leg and low back pain - Chest x-ray ordered to evaluate for early or developing acute chest given hisdyspnea - Hemoglobin 7.8, which is around his baseline of 8 - reticulocyte count with next blood draw - monitor daily CBC - Treat with 1 mg of Dilaudid every 2 hours as needed, will hold off on Toradol for now given his AK I. - he states that he's treated at [facility] sickle cell clinic but has not been able to go there for a few weeks, continue hydroxyurea for now - check reticulocyte count  AK I on CKD stage 2 - creatinine up to 2.2 from his baseline which appears to be 1.5-1.8 - Repeat BMP tomorrow - Avoid nephrotoxic agents - Hold ace inhibitor for the first day at least - will avoid Toradol for now  Substance abuse - UDS positive for Cocaine and marijuana  - counsel patient, avoid beta blockers   FEN/GI: normal saline at 100 mLs/hrr, regular diet Prophylaxis: subcutaneous heparin, Zofran  Disposition: admit to telemetry for IV pain medicine and close monitoring  History of Present Illness: [name] [name]. is a 33 y.o. male presenting with 2 day history of bilateral leg and low back pain. He states that he also has a mild headache and chills almost resolved at this point. He states they both began last night and worsened since that  point. He said last night it is too late for him to come to the hospital by himself so he got a ride today. He says he's been out of oral pain medications for the last 2 weeks and has had a hard time getting to the [facility] clinic because of car troubles. He notes mild dyspnea and a feeling of his heart racing whenever he does minimal movement. He also notes generalized fatigue for the same 2 days.  Review Of Systems: Per HPI, Otherwise 12 point review of systems was performed and was unremarkable.  Patient Active Problem List   Diagnosis Date Noted  . Vaso-occlusive sickle cell crisis 05/09/2013  . Sickle cell pain crisis 01/24/2013  . Cocaine abuse 09/26/2012  . Acute-on-chronic kidney injury 09/26/2012  . Hyperkalemia 09/26/2012  . Systolic murmur 35/32/9924  . Migraine 11/26/2009  . CHRONIC KIDNEY DISEASE STAGE II (MILD) 03/06/2009  . Sickle cell disease, type SS. No outpatient prescriptions from [facility] due to cocaine positive, opiate negative on high doses of given narcotics 06/18/2008  . TOBACCO ABUSE 05/22/2007   Past Medical History: Past Medical History  Diagnosis Date  . Sickle cell anemia   . Sickle cell crisis 09/25/2012  . Bacterial pneumonia ~ 2012    "caught it here in the hospital" (09/25/2012)  . History of blood transfusion     "always related to sickle cell crisis" (09/25/2012)  . GERD (gastroesophageal reflux disease)     "after I eat alot of spicey foods" (09/25/2012)  .  Migraines     "take RX qd to prevent them" (09/25/2012)  . Cyst of brain     "2 really small ones in the back of my head; inside; saw them w/MRI" (09/25/2012)  . Chronic kidney disease     "from my sickle cell" (09/25/2012)   Past Surgical History: Past Surgical History  Procedure Laterality Date  . Cholecystectomy  ~ 2012   Social History: History  Substance Use Topics  . Smoking status: Former Smoker    Types: Cigarettes  . Smokeless tobacco: Never Used     Comment: 09/25/2012 "I  don't buy cigarettes; bum one from friends q now and then"  . Alcohol Use: Yes     Comment: occasionally   Additional social history: Please also refer to relevant sections of EMR.  Family History: No family history on file. Allergies and Medications: Allergies  Allergen Reactions  . Morphine And Related Other (See Comments)    "real bad headaches"   No current facility-administered medications on file prior to encounter.   Current Outpatient Prescriptions on File Prior to Encounter  Medication Sig Dispense Refill  . diclofenac (VOLTAREN) 75 MG EC tablet Take 1 tablet (75 mg total) by mouth 2 (two) times daily. 30 tablet 0  . enalapril (VASOTEC) 5 MG tablet Take 1 tablet (5 mg total) by mouth daily. 30 tablet 3  . hydroxyurea (HYDREA) 500 MG capsule Take 3 capsules (1,500 mg total) by mouth daily. 90 capsule 5  . traMADol (ULTRAM) 50 MG tablet Take 1 tablet (50 mg total) by mouth every 8 (eight) hours as needed. 30 tablet 1  . nortriptyline (PAMELOR) 25 MG capsule Take 1 capsule (25 mg total) by mouth at bedtime. 30 capsule 11    Objective: BP 118/64 mmHg  Pulse 109  Temp(Src) 98.6 F (37 C) (Oral)  Resp 14  Ht '5\' 3"'  (1.6 m)  Wt 129 lb (58.514 kg)  BMI 22.86 kg/m2  SpO2 95% Exam: Gen: NAD, alert, cooperative with exam HEENT: NCAT, pupils small approximate 2 mm but reactive to light CV: RRR, good B1/Q9, soft 2/6 systolic murmur Resp: nonlabored, soft crackles at the bilateral bases Abd: SNTND, BS present, no guarding or organomegaly Ext: No edema, warm Neuro: Alert and oriented, No gross deficits MSK: No tenderness to palpation of the bilateral legs and arms   Labs and Imaging: CBC BMET   Recent Labs Lab 12/25/13 2152 12/25/13 2204  WBC 13.2*  --   HGB 7.8* 7.8*  HCT 21.5* 23.0*  PLT 224  --     Recent Labs Lab 12/25/13 2204  NA 136*  K 4.4  CL 112  BUN 39*  CREATININE 2.20*  GLUCOSE 129*     UDS positive for cocaine and THC  UA with negative  leukocytes and nitrites  Chest x-ray pending  Timmothy Euler, MD 12/26/2013, 3:16 AM PGY-3, [place] Intern pager: [phone], text pages welcome

## 2013-12-27 LAB — COMPREHENSIVE METABOLIC PANEL
ALK PHOS: 139 U/L — AB (ref 39–117)
ALT: 17 U/L (ref 0–53)
AST: 38 U/L — AB (ref 0–37)
Albumin: 3.9 g/dL (ref 3.5–5.2)
Anion gap: 16 — ABNORMAL HIGH (ref 5–15)
BUN: 17 mg/dL (ref 6–23)
CO2: 14 meq/L — AB (ref 19–32)
Calcium: 8.7 mg/dL (ref 8.4–10.5)
Chloride: 105 mEq/L (ref 96–112)
Creatinine, Ser: 1.35 mg/dL (ref 0.50–1.35)
GFR, EST AFRICAN AMERICAN: 80 mL/min — AB (ref >90)
GFR, EST NON AFRICAN AMERICAN: 69 mL/min — AB (ref >90)
Glucose, Bld: 96 mg/dL (ref 70–99)
POTASSIUM: 4.4 meq/L (ref 3.7–5.3)
SODIUM: 135 meq/L — AB (ref 137–147)
TOTAL PROTEIN: 6.9 g/dL (ref 6.0–8.3)
Total Bilirubin: 2.7 mg/dL — ABNORMAL HIGH (ref 0.3–1.2)

## 2013-12-27 LAB — CBC WITH DIFFERENTIAL/PLATELET
Basophils Absolute: 0 10*3/uL (ref 0.0–0.1)
Basophils Relative: 0 % (ref 0–1)
EOS ABS: 0.3 10*3/uL (ref 0.0–0.7)
Eosinophils Relative: 2 % (ref 0–5)
HCT: 20.2 % — ABNORMAL LOW (ref 39.0–52.0)
HEMOGLOBIN: 7.4 g/dL — AB (ref 13.0–17.0)
Lymphocytes Relative: 44 % (ref 12–46)
Lymphs Abs: 5.7 10*3/uL — ABNORMAL HIGH (ref 0.7–4.0)
MCH: 43.8 pg — AB (ref 26.0–34.0)
MCHC: 36.6 g/dL — AB (ref 30.0–36.0)
MCV: 119.5 fL — AB (ref 78.0–100.0)
MONO ABS: 1 10*3/uL (ref 0.1–1.0)
Monocytes Relative: 8 % (ref 3–12)
NEUTROS PCT: 46 % (ref 43–77)
Neutro Abs: 6 10*3/uL (ref 1.7–7.7)
Platelets: 275 10*3/uL (ref 150–400)
RBC: 1.69 MIL/uL — ABNORMAL LOW (ref 4.22–5.81)
RDW: 20.3 % — ABNORMAL HIGH (ref 11.5–15.5)
WBC: 13 10*3/uL — ABNORMAL HIGH (ref 4.0–10.5)

## 2013-12-27 LAB — RETICULOCYTES
RBC.: 1.69 MIL/uL — ABNORMAL LOW (ref 4.22–5.81)
RETIC CT PCT: 16.3 % — AB (ref 0.4–3.1)
Retic Count, Absolute: 275.5 10*3/uL — ABNORMAL HIGH (ref 19.0–186.0)

## 2013-12-27 MED ORDER — KETOROLAC TROMETHAMINE 30 MG/ML IJ SOLN
30.0000 mg | Freq: Three times a day (TID) | INTRAMUSCULAR | Status: DC
  Administered 2013-12-27 – 2013-12-29 (×7): 30 mg via INTRAVENOUS
  Filled 2013-12-27 (×8): qty 1

## 2013-12-27 MED ORDER — HYDROMORPHONE 0.3 MG/ML IV SOLN
INTRAVENOUS | Status: DC
  Administered 2013-12-27: 7.93 mg via INTRAVENOUS
  Administered 2013-12-28: 20:00:00 via INTRAVENOUS
  Administered 2013-12-28: 6.1 mL via INTRAVENOUS
  Administered 2013-12-28: 7.1 mL via INTRAVENOUS
  Administered 2013-12-28: 5.99 mL via INTRAVENOUS
  Administered 2013-12-28: 08:00:00 via INTRAVENOUS
  Administered 2013-12-28: 9.26 mL via INTRAVENOUS
  Administered 2013-12-28: 7.03 mL via INTRAVENOUS
  Administered 2013-12-28: 4.27 mL via INTRAVENOUS
  Filled 2013-12-27 (×3): qty 25

## 2013-12-27 MED ORDER — HYDROMORPHONE 0.3 MG/ML IV SOLN: INTRAVENOUS | Status: DC

## 2013-12-27 MED ORDER — OXYCODONE HCL 5 MG PO TABS
5.0000 mg | ORAL_TABLET | Freq: Once | ORAL | Status: AC
  Administered 2013-12-27: 5 mg via ORAL
  Filled 2013-12-27: qty 1

## 2013-12-27 NOTE — Progress Notes (Signed)
Patient ambulated in hallway and off unit .Tolerated activity  Well,no distress noted upon return.Reviewed pca continous and bolus dosing and use of protective equipment. Seems to be in better spirits  After visiting with friends

## 2013-12-27 NOTE — Progress Notes (Signed)
Family Medicine Teaching Service Daily Progress Note Intern Pager: [phone]  Patient name: [name] [name]. Medical record number: [id number] Date of birth: 03-06-1980 Age: 33 y.o. Gender: male  Primary Care Provider: Tawanna Sat, MD Consultants: None Code Status: Full  Pt Overview and Major Events to Date:  11/13: Admitted to [facility] Medicine Teaching Service. Started on PCA dilaudid. 11/14: Switch to oral pain medications  Assessment and Plan: [name] [name]. is a 33 y.o. male presenting with sickle cell pain crisis . PMH is significant for sickle cell SS disease, CKD stage 2, cocaine abuse, andtobacco abuse.  # Sickle Cell Pain Crisis-  Consistent with typical sickle cell pain crisis with bilateral leg and low back pain. Baseline 8. - CXR- no acute disease - Hemoglobin 7.8 at admission. 7.4 today  - Continue to monitor  - Consider transfusion when less than 7 - Follow up Reticulocyte count - Monitor daily CBC - PCA Dilaudid- still in significant pain. No basal dose of dilaudid prescribed. Will prescribe basal dose and have dilaudid available for breakthrough pain  - Lost IV last night. Was given Oxycodone 62m.  - Consider transition to oral pain medications tomorrow if appropriate - Avoid Toradol for now given his AKI - Refuses spirometry. Will alert [place]if chest pain or trouble breathing develops.  # AKI on CKD stage 2-  Baseline Creatinine 1.5-1.8 - Creatinine 2.2 at admission. Improved at 1.35 today - Repeat BMP tomorrow  # Hyponatremia- 135 today. - Continue to monitor  # Substance Abuse- UDS positive for Cocaine and marijuana  - Avoid beta blockers  FEN/GI: normal saline at 100 mLs/hrr, regular diet Prophylaxis: subcutaneous heparin, Zofran  [facility]  Subjective:  Lost IV overnight and was unable to use PCA for a short time. Complaining of worsening pain since IV was out and states dilaudid is not  effective. States he is still in significant pain in his lower back and legs. Denies chest pain or trouble breathing. Discussed use of spirometry to prevent acute chest and he said he would refuse to use it because it would make him worse. No bowel movements since admission, but refuses treatment of constipation. No further complaints today.  Objective: Temp:  [97.7 F (36.5 C)-99.5 F (37.5 C)] 99.5 F (37.5 C) (11/14 0532) Pulse Rate:  [92-113] 111 (11/14 0532) Resp:  [12-19] 13 (11/14 0752) BP: (107-120)/(62-78) 107/67 mmHg (11/14 0532) SpO2:  [90 %-98 %] 91 % (11/14 0752) FiO2 (%):  [90 %-93 %] 90 % (11/13 1417) Weight:  [129 lb (58.514 kg)] 129 lb (58.514 kg) (11/13 2049) Physical Exam: General: 89yomale resting comfortably in mild distress secondary to pain.  Cardiovascular: S1 and S2 noted. No murmurs/rubs/gallops. Tachycardic secondary to pain. Respiratory: Clear to auscultation bilaterally. No wheezes noted. No increased work of breathing. Abdomen: Bowel sounds noted. Soft and non-distended. No tenderness to palpation. Extremities: Tenderness of legs bilaterally. No edema noted.  Laboratory:  Recent Labs Lab 12/25/13 2152 12/25/13 2204 12/26/13 0409 12/27/13 0547  WBC 13.2*  --  14.5* PENDING  HGB 7.8* 7.8* 7.2* 7.4*  HCT 21.5* 23.0* 19.5* 20.2*  PLT 224  --  219 PENDING    Recent Labs Lab 12/25/13 2204 12/27/13 0547  NA 136* 135*  K 4.4 4.4  CL 112 105  CO2  --  14*  BUN 39* 17  CREATININE 2.20* 1.35  CALCIUM  --  8.7  PROT  --  6.9  BILITOT  --  2.7*  ALKPHOS  --  139*  ALT  --  17  AST  --  38*  GLUCOSE 129* 96   Urinalysis    Component Value Date/Time   COLORURINE YELLOW 12/25/2013 2236   APPEARANCEUR CLOUDY* 12/25/2013 2236   LABSPEC 1.009 12/25/2013 2236   PHURINE 5.5 12/25/2013 2236   GLUCOSEU NEGATIVE 12/25/2013 2236   HGBUR NEGATIVE 12/25/2013 2236   HGBUR small 03/10/2010 1445   BILIRUBINUR NEGATIVE 12/25/2013 2236   KETONESUR  NEGATIVE 12/25/2013 2236   PROTEINUR NEGATIVE 12/25/2013 2236   UROBILINOGEN 0.2 12/25/2013 2236   NITRITE NEGATIVE 12/25/2013 2236   LEUKOCYTESUR NEGATIVE 12/25/2013 2236  UDS- + for cocaine and THC  Imaging/Diagnostic Tests: Dg Chest 2 View  12/26/2013   CLINICAL DATA:  Dyspnea for 2 days.  EXAM: CHEST  2 VIEW  COMPARISON:  PA and lateral chest 07/28/2013, 05/09/2013 and 09/25/2012.  FINDINGS: There is no consolidative process, pneumothorax or effusion. Small foci of pulmonary scarring bilaterally are unchanged. Heart size is normal. No focal bony abnormality is identified.  IMPRESSION: No acute disease.  Stable compared to prior exams.   Electronically Signed   By: Inge Rise M.D.   On: 12/26/2013 03:32   Lorna Few, DO 12/27/2013, 8:38 AM PGY-1, [place] Intern pager: [phone], text pages welcome

## 2013-12-27 NOTE — Plan of Care (Signed)
Problem: Phase I Progression Outcomes Goal: Voiding Quantity Sufficient Outcome: Completed/Met Date Met:  12/27/13

## 2013-12-27 NOTE — Plan of Care (Signed)
Problem: Phase I Progression Outcomes Goal: Bowel Movement At Least Every 3 Days Outcome: Progressing Encouraged laxative while receiving pca Goal: Hemodynamically stable Outcome: Completed/Met Date Met:  12/27/13  Problem: Phase II Progression Outcomes Goal: Tolerating diet Outcome: Completed/Met Date Met:  12/27/13

## 2013-12-27 NOTE — Plan of Care (Signed)
Problem: Phase I Progression Outcomes Goal: Pain controlled with appropriate interventions Outcome: Completed/Met Date Met:  12/27/13 Goal: Initial discharge plan identified Outcome: Completed/Met Date Met:  12/27/13 Goal: Bowel Movement At Least Every 3 Days Outcome: Completed/Met Date Met:  12/27/13

## 2013-12-27 NOTE — Progress Notes (Signed)
Patient c/o dilaudid pca is not working with his leg pains.MD on call notified.No new orders received. Maurisha Mongeau, Wonda Cheng, Therapist, sports

## 2013-12-28 DIAGNOSIS — D508 Other iron deficiency anemias: Secondary | ICD-10-CM

## 2013-12-28 DIAGNOSIS — D5700 Hb-SS disease with crisis, unspecified: Secondary | ICD-10-CM | POA: Insufficient documentation

## 2013-12-28 LAB — CBC
HCT: 22.8 % — ABNORMAL LOW (ref 39.0–52.0)
Hemoglobin: 8.1 g/dL — ABNORMAL LOW (ref 13.0–17.0)
MCH: 39.3 pg — ABNORMAL HIGH (ref 26.0–34.0)
MCHC: 35.5 g/dL (ref 30.0–36.0)
MCV: 110.7 fL — ABNORMAL HIGH (ref 78.0–100.0)
Platelets: 259 10*3/uL (ref 150–400)
RBC: 2.06 MIL/uL — AB (ref 4.22–5.81)
RDW: 22.7 % — ABNORMAL HIGH (ref 11.5–15.5)
WBC: 11.4 10*3/uL — AB (ref 4.0–10.5)

## 2013-12-28 LAB — CBC WITH DIFFERENTIAL/PLATELET
Basophils Absolute: 0 10*3/uL (ref 0.0–0.1)
Basophils Relative: 0 % (ref 0–1)
EOS PCT: 1 % (ref 0–5)
Eosinophils Absolute: 0.1 10*3/uL (ref 0.0–0.7)
HCT: 18.2 % — ABNORMAL LOW (ref 39.0–52.0)
HEMOGLOBIN: 6.7 g/dL — AB (ref 13.0–17.0)
LYMPHS ABS: 5.6 10*3/uL — AB (ref 0.7–4.0)
Lymphocytes Relative: 49 % — ABNORMAL HIGH (ref 12–46)
MCH: 43.8 pg — AB (ref 26.0–34.0)
MCHC: 36.8 g/dL — ABNORMAL HIGH (ref 30.0–36.0)
MCV: 119 fL — ABNORMAL HIGH (ref 78.0–100.0)
MONO ABS: 0.7 10*3/uL (ref 0.1–1.0)
Monocytes Relative: 6 % (ref 3–12)
Neutro Abs: 5.1 10*3/uL (ref 1.7–7.7)
Neutrophils Relative %: 44 % (ref 43–77)
PLATELETS: 250 10*3/uL (ref 150–400)
RBC: 1.53 MIL/uL — ABNORMAL LOW (ref 4.22–5.81)
RDW: 19.1 % — ABNORMAL HIGH (ref 11.5–15.5)
WBC: 11.5 10*3/uL — ABNORMAL HIGH (ref 4.0–10.5)

## 2013-12-28 LAB — BASIC METABOLIC PANEL
Anion gap: 13 (ref 5–15)
BUN: 18 mg/dL (ref 6–23)
CALCIUM: 8.7 mg/dL (ref 8.4–10.5)
CHLORIDE: 103 meq/L (ref 96–112)
CO2: 18 meq/L — AB (ref 19–32)
CREATININE: 1.41 mg/dL — AB (ref 0.50–1.35)
GFR calc Af Amer: 76 mL/min — ABNORMAL LOW (ref >90)
GFR calc non Af Amer: 66 mL/min — ABNORMAL LOW (ref >90)
GLUCOSE: 96 mg/dL (ref 70–99)
Potassium: 4.6 mEq/L (ref 3.7–5.3)
Sodium: 134 mEq/L — ABNORMAL LOW (ref 137–147)

## 2013-12-28 LAB — PREPARE RBC (CROSSMATCH)

## 2013-12-28 MED ORDER — SODIUM CHLORIDE 0.9 % IV SOLN: Freq: Once | INTRAVENOUS | Status: DC

## 2013-12-28 MED ORDER — HYDROMORPHONE HCL 1 MG/ML IJ SOLN
2.0000 mg | INTRAMUSCULAR | Status: DC
  Administered 2013-12-28 – 2013-12-29 (×4): 2 mg via INTRAVENOUS
  Filled 2013-12-28 (×4): qty 2

## 2013-12-28 MED ORDER — HYDROMORPHONE HCL 1 MG/ML IJ SOLN
0.5000 mg | INTRAMUSCULAR | Status: DC | PRN
  Administered 2013-12-29: 0.5 mg via INTRAVENOUS
  Filled 2013-12-28: qty 1

## 2013-12-28 NOTE — Plan of Care (Signed)
Problem: Consults Goal: Sickle Cell Crisis Patient Education Outcome: Completed/Met Date Met:  12/28/13 Pt declines needing any information about sickle cell disease at this time.

## 2013-12-28 NOTE — Progress Notes (Signed)
Results for GOVANI, PALENZUELA (MRN [id number]) as of 12/28/2013 06:17  Ref. Range 12/28/2013 04:40  Hemoglobin Latest Range: 13.0-17.0 g/dL 6.7 (LL)  MD on call Paged.

## 2013-12-28 NOTE — Plan of Care (Signed)
Problem: Consults Goal: Social Work Consult if indicated Outcome: Not Applicable Date Met:  67/70/34

## 2013-12-28 NOTE — Plan of Care (Signed)
Problem: Consults Goal: Diabetes Guidelines if Diabetic/Glucose > 140 If diabetic or lab glucose is > 140 mg/dl - Initiate Diabetes/Hyperglycemia Guidelines & Document Interventions  Outcome: Not Applicable Date Met:  12/28/13     

## 2013-12-28 NOTE — Progress Notes (Signed)
Family Medicine Teaching Service Daily Progress Note Intern Pager: [phone]  Patient name: [name] [name]. Medical record number: [id number] Date of birth: 03-26-1980 Age: 33 y.o. Gender: male  Primary Care Provider: Tawanna Sat, MD Consultants: None Code Status: Full  Pt Overview and Major Events to Date:  11/13: Admitted to [facility] Medicine Teaching Service. Started on PCA dilaudid. 11/14: Switch to oral pain medications  Assessment and Plan: [name] [name]. is a 32 y.o. male presenting with sickle cell pain crisis . PMH is significant for sickle cell SS disease, CKD stage 2, cocaine abuse, andtobacco abuse.  # Sickle Cell Pain Crisis-  Consistent with typical sickle cell pain crisis with bilateral leg and low back pain. Baseline 8. - CXR- no acute disease - Hemoglobin 7.8 at admission. Decreased to 6.7 today  - Continue to monitor  - Will transfuse 1 unit of blood today. Recheck H/H ~2hr after transfusion - Follow up Reticulocyte count - Monitor daily CBC - PCA Dilaudid-  basal dose added yesterday with dilaudid available for breakthrough pain  -  Will de-escalate pump today to IV pain medications following transfusion - Toradol 38m q8hr initiated given improved Cr - Refuses spirometry. Will alert u[place]if chest pain or trouble breathing develops.  # AKI on CKD stage 2-  Baseline Creatinine 1.5-1.8 - Creatinine 2.2 at admission. Improved at 1.41 today - Repeat BMP tomorrow  # Hyponatremia- 134 today - Continue to monitor  # Substance Abuse- UDS positive for Cocaine and marijuana  - Avoid beta blockers  FEN/GI: normal saline at 100 mLs/hrr, regular diet Prophylaxis: subcutaneous heparin, Zofran  [facility]  Subjective:  No acute complaints overnight. States pain has improved since yesterday. Still located in legs, however now denies pain in lower back. Denies chest pain. Continues to admit to constipation, but  refuses medication.   Objective: Temp:  [98.5 F (36.9 C)-99.5 F (37.5 C)] 98.5 F (36.9 C) (11/14 1957) Pulse Rate:  [99-111] 99 (11/14 1957) Resp:  [13-20] 20 (11/15 0350) BP: (107-117)/(66-74) 112/69 mmHg (11/14 1957) SpO2:  [91 %-100 %] 100 % (11/15 0350) FiO2 (%):  [90 %-100 %] 100 % (11/15 0002) Weight:  [121 lb (54.885 kg)] 121 lb (54.885 kg) (11/14 1957) Physical Exam: General: 89yomale resting comfortably in mild distress secondary to pain.  Cardiovascular: S1 and S2 noted. No murmurs/rubs/gallops.  Respiratory: Clear to auscultation bilaterally. No wheezes noted. No increased work of breathing. Abdomen: Bowel sounds noted. Soft and non-distended. No tenderness to palpation. Extremities: Tenderness of legs bilaterally. No edema noted.  Laboratory:  Recent Labs Lab 12/25/13 2152 12/25/13 2204 12/26/13 0409 12/27/13 0547  WBC 13.2*  --  14.5* 13.0*  HGB 7.8* 7.8* 7.2* 7.4*  HCT 21.5* 23.0* 19.5* 20.2*  PLT 224  --  219 275    Recent Labs Lab 12/25/13 2204 12/27/13 0547  NA 136* 135*  K 4.4 4.4  CL 112 105  CO2  --  14*  BUN 39* 17  CREATININE 2.20* 1.35  CALCIUM  --  8.7  PROT  --  6.9  BILITOT  --  2.7*  ALKPHOS  --  139*  ALT  --  17  AST  --  38*  GLUCOSE 129* 96   Urinalysis    Component Value Date/Time   COLORURINE YELLOW 12/25/2013 2236   APPEARANCEUR CLOUDY* 12/25/2013 2236   LABSPEC 1.009 12/25/2013 2236   PHURINE 5.5 12/25/2013 2236   GLUCOSEU NEGATIVE 12/25/2013 2236  HGBUR NEGATIVE 12/25/2013 2236   HGBUR small 03/10/2010 1445   BILIRUBINUR NEGATIVE 12/25/2013 2236   KETONESUR NEGATIVE 12/25/2013 2236   PROTEINUR NEGATIVE 12/25/2013 2236   UROBILINOGEN 0.2 12/25/2013 2236   NITRITE NEGATIVE 12/25/2013 2236   LEUKOCYTESUR NEGATIVE 12/25/2013 2236  UDS- + for cocaine and THC  Imaging/Diagnostic Tests: No results found. [place], [place] 12/28/2013, 4:57 AM PGY-1, [place] Intern pager: [phone],  text pages welcome

## 2013-12-28 NOTE — Plan of Care (Signed)
Problem: Phase I Progression Outcomes Goal: Pulmonary Hygiene as Indicated (Sickle Cell) Outcome: Not Applicable Date Met:  79/39/03 Lungs clear throughout all lobes.

## 2013-12-28 NOTE — Plan of Care (Signed)
Problem: Consults Goal: Skin Care Protocol Initiated - if Braden Score 18 or less If consults are not indicated, leave blank or document N/A  Outcome: Not Applicable Date Met:  09/02/80 Goal: Nutrition Consult-if indicated Outcome: Not Applicable Date Met:  88/33/74

## 2013-12-29 LAB — TYPE AND SCREEN
ABO/RH(D): O POS
ANTIBODY SCREEN: NEGATIVE
Donor AG Type: NEGATIVE
Unit division: 0

## 2013-12-29 LAB — CBC WITH DIFFERENTIAL/PLATELET
BASOS PCT: 0 % (ref 0–1)
Basophils Absolute: 0 10*3/uL (ref 0.0–0.1)
EOS PCT: 3 % (ref 0–5)
Eosinophils Absolute: 0.3 10*3/uL (ref 0.0–0.7)
HCT: 23.1 % — ABNORMAL LOW (ref 39.0–52.0)
HEMOGLOBIN: 8.4 g/dL — AB (ref 13.0–17.0)
LYMPHS ABS: 4.5 10*3/uL — AB (ref 0.7–4.0)
Lymphocytes Relative: 46 % (ref 12–46)
MCH: 40.2 pg — ABNORMAL HIGH (ref 26.0–34.0)
MCHC: 36.4 g/dL — ABNORMAL HIGH (ref 30.0–36.0)
MCV: 110.5 fL — ABNORMAL HIGH (ref 78.0–100.0)
MONOS PCT: 8 % (ref 3–12)
Monocytes Absolute: 0.8 10*3/uL (ref 0.1–1.0)
Neutro Abs: 4.3 10*3/uL (ref 1.7–7.7)
Neutrophils Relative %: 43 % (ref 43–77)
PLATELETS: 238 10*3/uL (ref 150–400)
RBC: 2.09 MIL/uL — AB (ref 4.22–5.81)
RDW: 23.3 % — ABNORMAL HIGH (ref 11.5–15.5)
WBC: 9.9 10*3/uL (ref 4.0–10.5)

## 2013-12-29 MED ORDER — OXYCODONE HCL 5 MG PO TABS: 10.0000 mg | ORAL_TABLET | ORAL | Status: DC | PRN

## 2013-12-29 MED ORDER — OXYCODONE HCL ER 15 MG PO T12A
15.0000 mg | EXTENDED_RELEASE_TABLET | Freq: Two times a day (BID) | ORAL | Status: DC
Start: 2013-12-29 — End: 2013-12-29
  Administered 2013-12-29: 15 mg via ORAL
  Filled 2013-12-29: qty 1

## 2013-12-29 MED ORDER — OXYCODONE HCL 10 MG PO TABS: 10.0000 mg | ORAL_TABLET | ORAL | Status: DC | PRN

## 2013-12-29 NOTE — Progress Notes (Signed)
Family Medicine Teaching Service Daily Progress Note Intern Pager: [phone]  Patient name: [name] [name]. Medical record number: [id number] Date of birth: 1980/06/29 Age: 33 y.o. Gender: male  Primary Care Provider: Tawanna Sat, MD Consultants: None Code Status: Full  Pt Overview and Major Events to Date:  11/13: Admitted to [facility] Medicine Teaching Service. Started on PCA dilaudid. 11/14: Switch to oral pain medications  Assessment and Plan: [name] [name]. is a 33 y.o. male presenting with sickle cell pain crisis . PMH is significant for sickle cell SS disease, CKD stage 2, cocaine abuse, andtobacco abuse.  # Sickle Cell Pain Crisis-  Consistent with typical sickle cell pain crisis with bilateral leg and low back pain. Baseline 8. - CXR- no acute disease - Hemoglobin 7.8 at admission. Decreased to 6.7 on 11/15 and was transfused 1unit. Post transfusion hemoglobin 8.  - 8.4 this am  - Continue to monitor - Monitor daily CBC - PCA Dilaudid discontinued on 11/15. Transitioned to IV dilaudid 60m q3hr with 0.520mq1hr PRN yesterday.  - Will transition to oral pain medication today. Started on Oxycontin 12hr tablet 1522mID, Oxycodone IR 55m4mhr PRN - Toradol 30mg54mr initiated given improved Cr - Refuses spirometry. Will alert us ifKoreahest pain or trouble breathing develops.  # AKI on CKD stage 2-  Baseline Creatinine 1.5-1.8 - Creatinine 2.2 at admission. Improved at 1.41  FEN/GI: normal saline at 100 mLs/hrr, regular diet Prophylaxis: subcutaneous heparin, Zofran  [facility]charge pending improvement in pain.  Subjective:  No acute complaints overnight.  States he is still experiencing pain in his lower extremities, however he states this is much improved since yesterday. Denies back pain or chest pain. Would like to attempt to transition to oral pain medications this afternoon and possibly go home today. Had  one bowel movement yesterday. No further complaints today.  Objective: Temp:  [97.6 F (36.4 C)-98.7 F (37.1 C)] 98.7 F (37.1 C) (11/15 2050) Pulse Rate:  [91-122] 91 (11/15 2050) Resp:  [12-21] 18 (11/15 2050) BP: (95-130)/(54-78) 109/68 mmHg (11/15 2050) SpO2:  [94 %-100 %] 100 % (11/15 2050) Weight:  [125 lb (56.7 kg)] 125 lb (56.7 kg) (11/15 2050) Physical Exam: General: 32yo 33yo resting comfortably in mild distress secondary to pain.  Cardiovascular: S1 and S2 noted. No murmurs/rubs/gallops.  Respiratory: Clear to auscultation bilaterally. No wheezes noted. No increased work of breathing. Abdomen: Bowel sounds noted. Soft and non-distended. No tenderness to palpation. Extremities: Tenderness of legs bilaterally. No edema noted.  Laboratory:  Recent Labs Lab 12/27/13 0547 12/28/13 0440 12/28/13 1824  WBC 13.0* 11.5* 11.4*  HGB 7.4* 6.7* 8.1*  HCT 20.2* 18.2* 22.8*  PLT 275 250 259    Recent Labs Lab 12/25/13 2204 12/27/13 0547 12/28/13 0440  NA 136* 135* 134*  K 4.4 4.4 4.6  CL 112 105 103  CO2  --  14* 18*  BUN 39* 17 18  CREATININE 2.20* 1.35 1.41*  CALCIUM  --  8.7 8.7  PROT  --  6.9  --   BILITOT  --  2.7*  --   ALKPHOS  --  139*  --   ALT  --  17  --   AST  --  38*  --   GLUCOSE 129* 96 96   Urinalysis    Component Value Date/Time   COLORURINE YELLOW 12/25/2013 2236   APPEARANCEUR CLOUDY* 12/25/2013 2236   LABSPEC 1.009 12/25/2013 2236  PHURINE 5.5 12/25/2013 2236   GLUCOSEU NEGATIVE 12/25/2013 2236   HGBUR NEGATIVE 12/25/2013 2236   HGBUR small 03/10/2010 1445   BILIRUBINUR NEGATIVE 12/25/2013 2236   KETONESUR NEGATIVE 12/25/2013 2236   PROTEINUR NEGATIVE 12/25/2013 2236   UROBILINOGEN 0.2 12/25/2013 2236   NITRITE NEGATIVE 12/25/2013 2236   LEUKOCYTESUR NEGATIVE 12/25/2013 2236  UDS- + for cocaine and THC  Imaging/Diagnostic Tests: No results found. [place], [place] 12/29/2013, 4:25 AM PGY-1, [place]  Intern pager: [phone], text pages welcome

## 2013-12-29 NOTE — Progress Notes (Signed)
Patient discharged home with discharge paperwork and prescriptions. Patient was stable upon discharge and had belongings with him.

## 2013-12-29 NOTE — Progress Notes (Signed)
Medicare Important Message given? YES  (If response is "NO", the following Medicare IM given date fields will be blank)  Date Medicare IM given:  12/29/2013 Medicare IM given by: Jonnie Finner

## 2013-12-29 NOTE — Discharge Instructions (Signed)
Sickle Cell Anemia, Adult °Sickle cell anemia is a condition in which red blood cells have an abnormal "sickle" shape. This abnormal shape shortens the cells' life span, which results in a lower than normal concentration of red blood cells in the blood. The sickle shape also causes the cells to clump together and block free blood flow through the blood vessels. As a result, the tissues and organs of the body do not receive enough oxygen. Sickle cell anemia causes organ damage and pain and increases the risk of infection. °CAUSES  °Sickle cell anemia is a genetic disorder. Those who receive two copies of the gene have the condition, and those who receive one copy have the trait. °RISK FACTORS °The sickle cell gene is most common in people whose families originated in [place]. Other areas of the globe where sickle cell trait occurs include the [place], [place] and [place], the [place], and the [place].  °SIGNS AND SYMPTOMS °· Pain, especially in the extremities, back, chest, or abdomen (common). The pain may start suddenly or may develop following an illness, especially if there is dehydration. Pain can also occur due to overexertion or exposure to extreme temperature changes. °· Frequent severe bacterial infections, especially certain types of pneumonia and meningitis. °· Pain and swelling in the hands and feet. °· Decreased activity.   °· Loss of appetite.   °· Change in behavior. °· Headaches. °· Seizures. °· Shortness of breath or difficulty breathing. °· Vision changes. °· Skin ulcers. °Those with the trait may not have symptoms or they may have mild symptoms.  °DIAGNOSIS  °Sickle cell anemia is diagnosed with blood tests that demonstrate the genetic trait. It is often diagnosed during the newborn period, due to mandatory testing nationwide. A variety of blood tests, X-rays, CT scans, MRI scans, ultrasounds, and lung function tests may also be done to monitor the condition. °TREATMENT  °Sickle  cell anemia may be treated with: °· Medicines. You may be given pain medicines, antibiotic medicines (to treat and prevent infections) or medicines to increase the production of certain types of hemoglobin. °· Fluids. °· Oxygen. °· Blood transfusions. °HOME CARE INSTRUCTIONS  °· Drink enough fluid to keep your urine clear or pale yellow. Increase your fluid intake in hot weather and during exercise. °· Do not smoke. Smoking lowers oxygen levels in the blood.   °· Only take over-the-counter or prescription medicines for pain, fever, or discomfort as directed by your health care provider. °· Take antibiotics as directed by your health care provider. Make sure you finish them it even if you start to feel better.   °· Take supplements as directed by your health care provider.   °· Consider wearing a medical alert bracelet. This tells anyone caring for you in an emergency of your condition.   °· When traveling, keep your medical information, health care provider's names, and the medicines you take with you at all times.   °· If you develop a fever, do not take medicines to reduce the fever right away. This could cover up a problem that is developing. Notify your health care provider. °· Keep all follow-up appointments with your health care provider. Sickle cell anemia requires regular medical care. °SEEK MEDICAL CARE IF: ° You have a fever. °SEEK IMMEDIATE MEDICAL CARE IF:  °· You feel dizzy or faint.   °· You have new abdominal pain, especially on the left side near the stomach area.   °· You develop a persistent, often uncomfortable and painful penile erection (priapism). If this is not treated immediately it   will lead to impotence.   °· You have numbness your arms or legs or you have a hard time moving them.   °· You have a hard time with speech.   °· You have a fever or persistent symptoms for more than 2-3 days.   °· You have a fever and your symptoms suddenly get worse.   °· You have signs or symptoms of infection.  These include:   °¨ Chills.   °¨ Abnormal tiredness (lethargy).   °¨ Irritability.   °¨ Poor eating.   °¨ Vomiting.   °· You develop pain that is not helped with medicine.   °· You develop shortness of breath. °· You have pain in your chest.   °· You are coughing up pus-like or bloody sputum.   °· You develop a stiff neck. °· Your feet or hands swell or have pain. °· Your abdomen appears bloated. °· You develop joint pain. °MAKE SURE YOU: °· Understand these instructions. °Document Released: 05/10/2005 Document Revised: 06/16/2013 Document Reviewed: 09/11/2012 °ExitCare® Patient Information ©2015 [employer]. This information is not intended to replace advice given to you by your health care provider. Make sure you discuss any questions you have with your health care provider. ° °

## 2013-12-31 NOTE — Discharge Summary (Signed)
[facility] Discharge Summary  Patient name: [name] [name]. Medical record number: [id number] Date of birth: 08-28-1980 Age: 33 y.o. Gender: male Date of Admission: 12/25/2013  Date of Discharge: 12/29/13 Admitting Physician: Blane Ohara McDiarmid, MD  Primary Care Provider: Tawanna Sat, MD Consultants: None  Indication for Hospitalization: Sickle Cell Pain Crisis  Discharge Diagnoses/Problem List:  Sickle Cell Pain Crisis Acute Kidney Injury Anemia Hyponatremia  Disposition: Discharge Home  Discharge Condition: Stable   Brief Hospital Course:  Mr. Pihl was admitted on 12/26/13 with pain consistent with previous episodes of sickle cell pain crisis. CXR showed no acute cardiopulmonary disease. Initial hemoglobin was 7.8, however this trended down to 6.7 on 12/28/13 and Mr. Szczepanik was transfused 1 unit. Post transfusion hemoglobin was 8.1.  He was placed on PCA on 12/26/13. Pain medications were de-escalated off of PCA to IV pain medications on 12/28/13. Pain medications were once again de-escalated to oral pain medications on 12/29/13.   Creatinine was noted to be 2.2 at admission. This improved throughout hospitalization and was 1.41 on day of discharge. UDS was positive for Cocaine and Marijuana.  Mr. Marana was discharged on 11/16 following improvement of pain and tolerance of oral pain medications.  Issues for Follow Up:  Continue to monitor CBC for improvement Follows at [facility] for Sickle Cell management  Significant Procedures: Transfusion  Significant Labs and Imaging:   Recent Labs Lab 12/28/13 0440 12/28/13 1824 12/29/13 0524  WBC 11.5* 11.4* 9.9  HGB 6.7* 8.1* 8.4*  HCT 18.2* 22.8* 23.1*  PLT 250 259 238    Recent Labs Lab 12/25/13 2204 12/27/13 0547 12/28/13 0440  NA 136* 135* 134*  K 4.4 4.4 4.6  CL 112 105 103  CO2  --  14* 18*  GLUCOSE 129* 96 96  BUN 39* 17 18  CREATININE 2.20* 1.35 1.41*  CALCIUM  --   8.7 8.7  ALKPHOS  --  139*  --   AST  --  38*  --   ALT  --  17  --   ALBUMIN  --  3.9  --   - UDS + for Cocaine and THC  Results/Tests Pending at Time of Discharge: None  Discharge Medications:    Medication List    TAKE these medications        diclofenac 75 MG EC tablet  Commonly known as:  VOLTAREN  Take 1 tablet (75 mg total) by mouth 2 (two) times daily.     enalapril 5 MG tablet  Commonly known as:  VASOTEC  Take 1 tablet (5 mg total) by mouth daily.     hydroxyurea 500 MG capsule  Commonly known as:  HYDREA  Take 3 capsules (1,500 mg total) by mouth daily.     nortriptyline 25 MG capsule  Commonly known as:  PAMELOR  Take 1 capsule (25 mg total) by mouth at bedtime.     Oxycodone HCl 10 MG Tabs  Take 1 tablet (10 mg total) by mouth every 4 (four) hours as needed for severe pain.     traMADol 50 MG tablet  Commonly known as:  ULTRAM  Take 1 tablet (50 mg total) by mouth every 8 (eight) hours as needed.        Discharge Instructions: Please refer to Patient Instructions section of EMR for full details.  Patient was counseled important signs and symptoms that should prompt return to medical care, changes in medications, dietary instructions, activity restrictions, and follow up appointments.  Follow-Up Appointments:     Follow-up Information    Follow up with Tawanna Sat, MD On 01/14/2014.   Specialty:  Family Medicine   Why:  @10 [address] [address]ont for hospital follow up   Contact information:   [place] [address] [phone]      [place], DO 12/31/2013, 5:17 PM PGY-1, [place]

## 2014-01-14 ENCOUNTER — Ambulatory Visit: Payer: Medicare Other | Admitting: Family Medicine

## 2014-02-23 ENCOUNTER — Emergency Department (HOSPITAL_COMMUNITY)
Admission: EM | Admit: 2014-02-23 | Discharge: 2014-02-23 | Disposition: A | Discharge status: Home / Self Care | Payer: Medicare Other | Attending: Emergency Medicine | Admitting: Emergency Medicine

## 2014-02-23 ENCOUNTER — Emergency Department (HOSPITAL_COMMUNITY): Payer: Medicare Other

## 2014-02-23 DIAGNOSIS — Z8669 Personal history of other diseases of the nervous system and sense organs: Secondary | ICD-10-CM | POA: Insufficient documentation

## 2014-02-23 DIAGNOSIS — Z8719 Personal history of other diseases of the digestive system: Secondary | ICD-10-CM | POA: Diagnosis not present

## 2014-02-23 DIAGNOSIS — N189 Chronic kidney disease, unspecified: Secondary | ICD-10-CM | POA: Diagnosis not present

## 2014-02-23 DIAGNOSIS — D5700 Hb-SS disease with crisis, unspecified: Secondary | ICD-10-CM | POA: Diagnosis present

## 2014-02-23 DIAGNOSIS — Z87891 Personal history of nicotine dependence: Secondary | ICD-10-CM | POA: Insufficient documentation

## 2014-02-23 DIAGNOSIS — Z8701 Personal history of pneumonia (recurrent): Secondary | ICD-10-CM | POA: Insufficient documentation

## 2014-02-23 DIAGNOSIS — Z79899 Other long term (current) drug therapy: Secondary | ICD-10-CM | POA: Insufficient documentation

## 2014-02-23 DIAGNOSIS — G43909 Migraine, unspecified, not intractable, without status migrainosus: Secondary | ICD-10-CM | POA: Diagnosis not present

## 2014-02-23 LAB — CBC WITH DIFFERENTIAL/PLATELET
BASOS PCT: 0 % (ref 0–1)
Basophils Absolute: 0 10*3/uL (ref 0.0–0.1)
Eosinophils Absolute: 0.1 10*3/uL (ref 0.0–0.7)
Eosinophils Relative: 1 % (ref 0–5)
HCT: 27.6 % — ABNORMAL LOW (ref 39.0–52.0)
HEMOGLOBIN: 9.7 g/dL — AB (ref 13.0–17.0)
LYMPHS ABS: 4.2 10*3/uL — AB (ref 0.7–4.0)
LYMPHS PCT: 45 % (ref 12–46)
MCH: 42.5 pg — ABNORMAL HIGH (ref 26.0–34.0)
MCHC: 35.1 g/dL (ref 30.0–36.0)
MCV: 121.1 fL — ABNORMAL HIGH (ref 78.0–100.0)
MONO ABS: 0.9 10*3/uL (ref 0.1–1.0)
Monocytes Relative: 10 % (ref 3–12)
NEUTROS PCT: 44 % (ref 43–77)
Neutro Abs: 4.1 10*3/uL (ref 1.7–7.7)
Platelets: 319 10*3/uL (ref 150–400)
RBC: 2.28 MIL/uL — AB (ref 4.22–5.81)
RDW: 20.5 % — ABNORMAL HIGH (ref 11.5–15.5)
WBC: 9.3 10*3/uL (ref 4.0–10.5)

## 2014-02-23 LAB — RETICULOCYTES
RBC.: 2.28 MIL/uL — ABNORMAL LOW (ref 4.22–5.81)
Retic Count, Absolute: 177.8 10*3/uL (ref 19.0–186.0)
Retic Ct Pct: 7.8 % — ABNORMAL HIGH (ref 0.4–3.1)

## 2014-02-23 MED ORDER — KETOROLAC TROMETHAMINE 30 MG/ML IJ SOLN
15.0000 mg | Freq: Once | INTRAMUSCULAR | Status: AC
  Administered 2014-02-23: 15 mg via INTRAVENOUS
  Filled 2014-02-23: qty 1

## 2014-02-23 MED ORDER — OXYCODONE HCL 10 MG PO TABS: 10.0000 mg | ORAL_TABLET | ORAL | Status: DC | PRN

## 2014-02-23 MED ORDER — HYDROMORPHONE HCL 1 MG/ML IJ SOLN
2.0000 mg | Freq: Once | INTRAMUSCULAR | Status: AC
  Administered 2014-02-23: 2 mg via INTRAMUSCULAR
  Filled 2014-02-23: qty 2

## 2014-02-23 MED ORDER — HYDROMORPHONE HCL 1 MG/ML IJ SOLN: 2.0000 mg | Freq: Once | INTRAMUSCULAR | Status: DC

## 2014-02-23 MED ORDER — HYDROMORPHONE HCL 1 MG/ML IJ SOLN
2.0000 mg | Freq: Once | INTRAMUSCULAR | Status: AC
  Administered 2014-02-23: 2 mg via INTRAVENOUS
  Filled 2014-02-23: qty 2

## 2014-02-23 MED ORDER — SODIUM CHLORIDE 0.9 % IV BOLUS (SEPSIS)
1000.0000 mL | Freq: Once | INTRAVENOUS | Status: AC
  Administered 2014-02-23: 1000 mL via INTRAVENOUS

## 2014-02-23 MED ORDER — ONDANSETRON HCL 4 MG/2ML IJ SOLN
4.0000 mg | INTRAMUSCULAR | Status: DC | PRN
  Administered 2014-02-23: 4 mg via INTRAVENOUS
  Filled 2014-02-23: qty 2

## 2014-02-23 MED ORDER — FENTANYL CITRATE 0.05 MG/ML IJ SOLN: 100.0000 ug | Freq: Once | INTRAMUSCULAR | Status: DC
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
## 2014-02-23 NOTE — Discharge Instructions (Signed)
Sickle Cell Anemia °Sickle cell anemia is a condition where your red blood cells are shaped like sickles. Red blood cells carry oxygen through the body. Sickle-shaped red blood cells do not live as long as normal red blood cells. They also clump together and block blood from flowing through the blood vessels. These things prevent the body from getting enough oxygen. Sickle cell anemia causes organ damage and pain. It also increases the risk of infection. °HOME CARE °· Drink enough fluid to keep your pee (urine) clear or pale yellow. Drink more in hot weather and during exercise. °· Do not smoke. Smoking lowers oxygen levels in the blood. °· Only take over-the-counter or prescription medicines as told by your doctor. °· Take antibiotic medicines as told by your doctor. Make sure you finish them even if you start to feel better. °· Take supplements as told by your doctor. °· Consider wearing a medical alert bracelet. This tells anyone caring for you in an emergency of your condition. °· When traveling, keep your medical information, doctors' names, and the medicines you take with you at all times. °· If you have a fever, do not take fever medicines right away. This could cover up a problem. Tell your doctor. °·  Keep all follow-up visits with your doctor. Sickle cell anemia requires regular medical care. °GET HELP IF: °You have a fever. °GET HELP RIGHT AWAY IF: °· You feel dizzy or faint. °· You have new belly (abdominal) pain, especially on the left side near the stomach area. °· You have a lasting, often uncomfortable and painful erection of the penis (priapism). If it is not treated right away, you will become unable to have sex (impotence). °· You have numbness in your arms or legs or you have a hard time moving them. °· You have a hard time talking. °· You have a fever or lasting symptoms for more than 2-3 days. °· You have a fever and your symptoms suddenly get worse. °· You have signs or symptoms of infection.  These include: °¨ Chills. °¨ Being more tired than normal (lethargy). °¨ Irritability. °¨ Poor eating. °¨ Throwing up (vomiting). °· You have pain that is not helped with medicine. °· You have shortness of breath. °· You have pain in your chest. °· You are coughing up pus-like or bloody mucus. °· You have a stiff neck. °· Your feet or hands swell or have pain. °· Your belly looks bloated. °· Your joints hurt. °MAKE SURE YOU: °· Understand these instructions. °· Will watch your condition. °· Will get help right away if you are not doing well or get worse. °Document Released: 11/20/2012 Document Revised: 06/16/2013 Document Reviewed: 11/20/2012 °ExitCare® Patient Information ©2015 [employer]. This information is not intended to replace advice given to you by your health care provider. Make sure you discuss any questions you have with your health care provider. ° °

## 2014-02-23 NOTE — ED Notes (Signed)
Dr Beaton at bedside 

## 2014-02-23 NOTE — ED Notes (Signed)
Paged IV team to inquire about delay. Dr. Audie Pinto and Patient updated.

## 2014-02-23 NOTE — ED Notes (Signed)
IV Team at bedside 

## 2014-02-23 NOTE — ED Notes (Signed)
3 attempts at IV start failed. IV team consulted.

## 2014-02-23 NOTE — ED Notes (Signed)
Pt reports he usually takes Toradol and dilaudid.

## 2014-03-02 IMAGING — CR DG CHEST 2V
2 series · 2 of 2 positions shown · non-contrast
Comparison: Chest radiograph [DATE]

CLINICAL DATA: Sickle cell crisis

CHEST - 2 VIEW

[w chest pa]
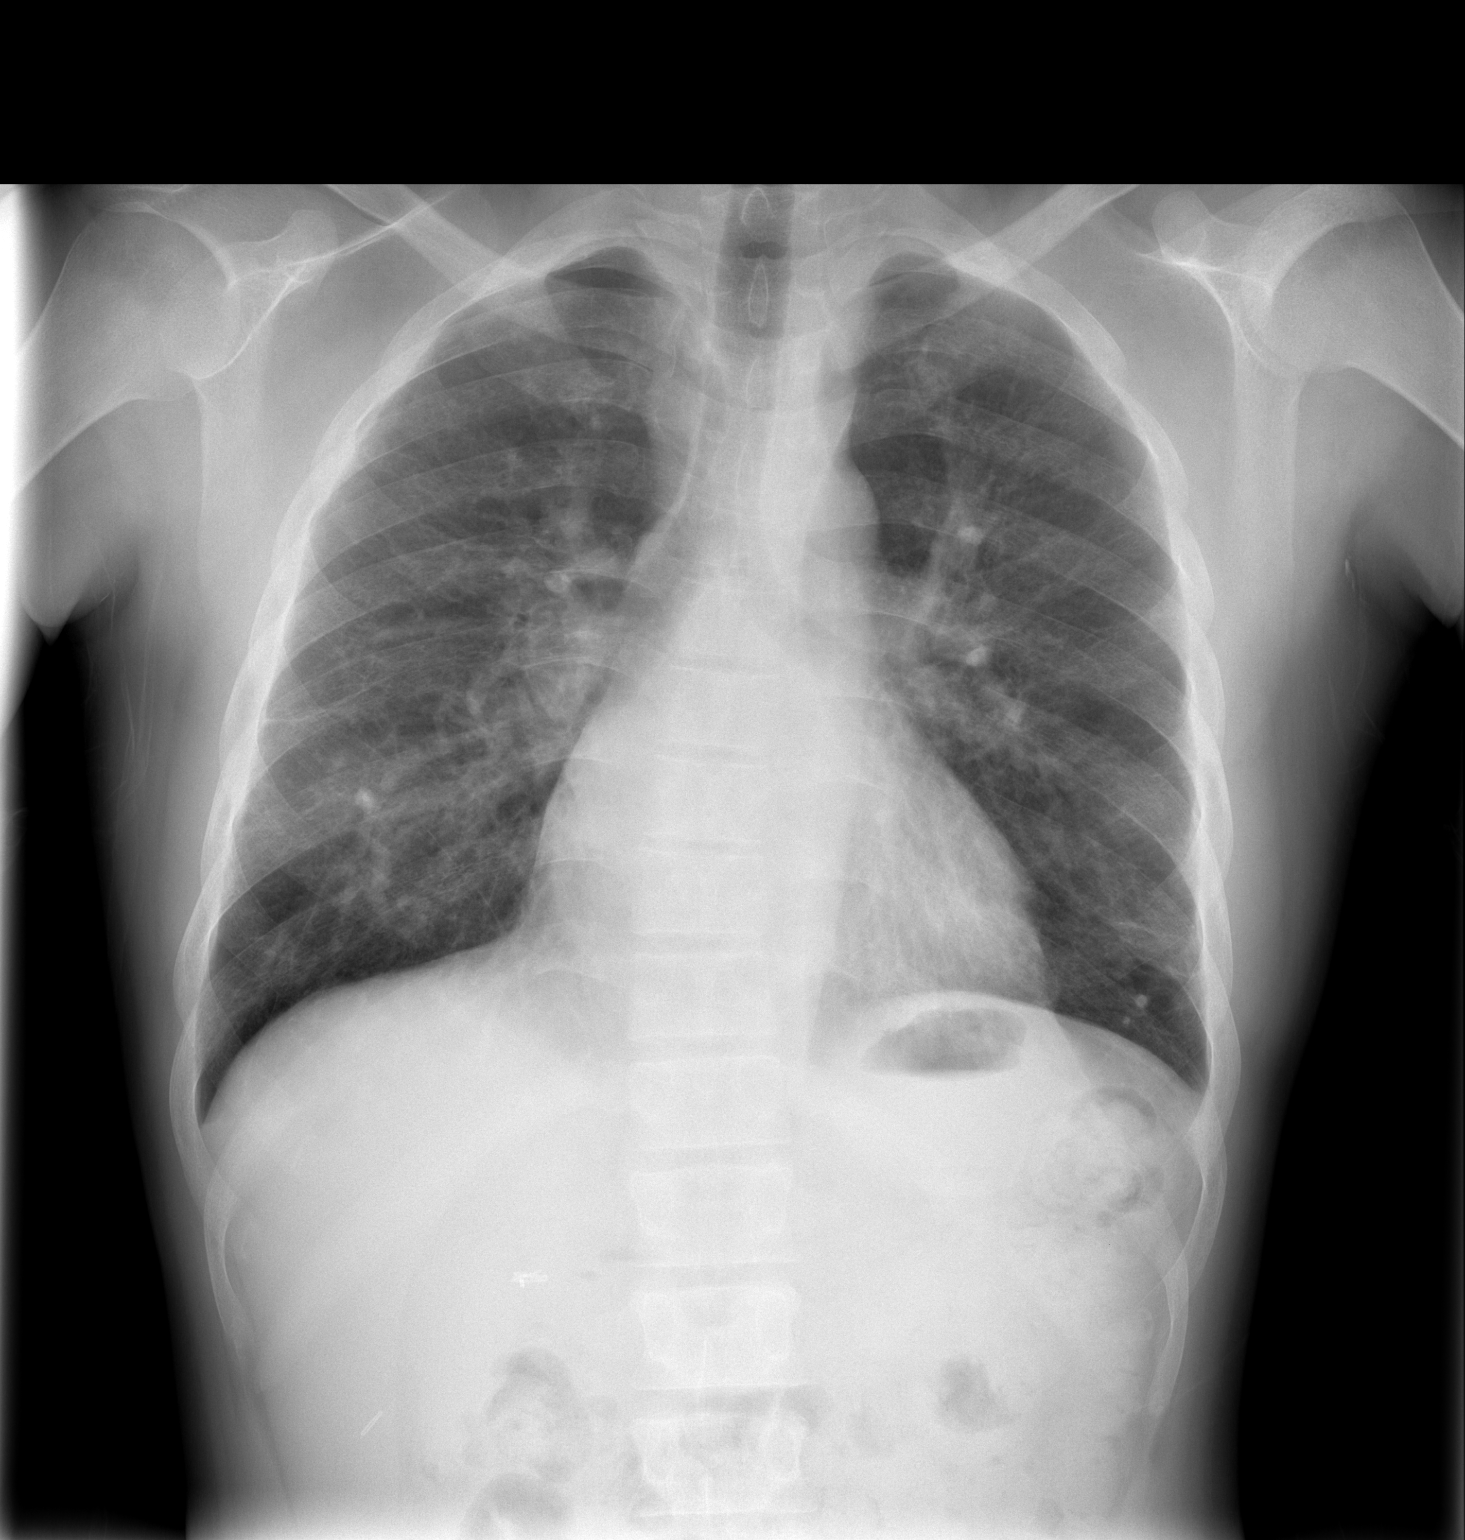

[w chest lat]
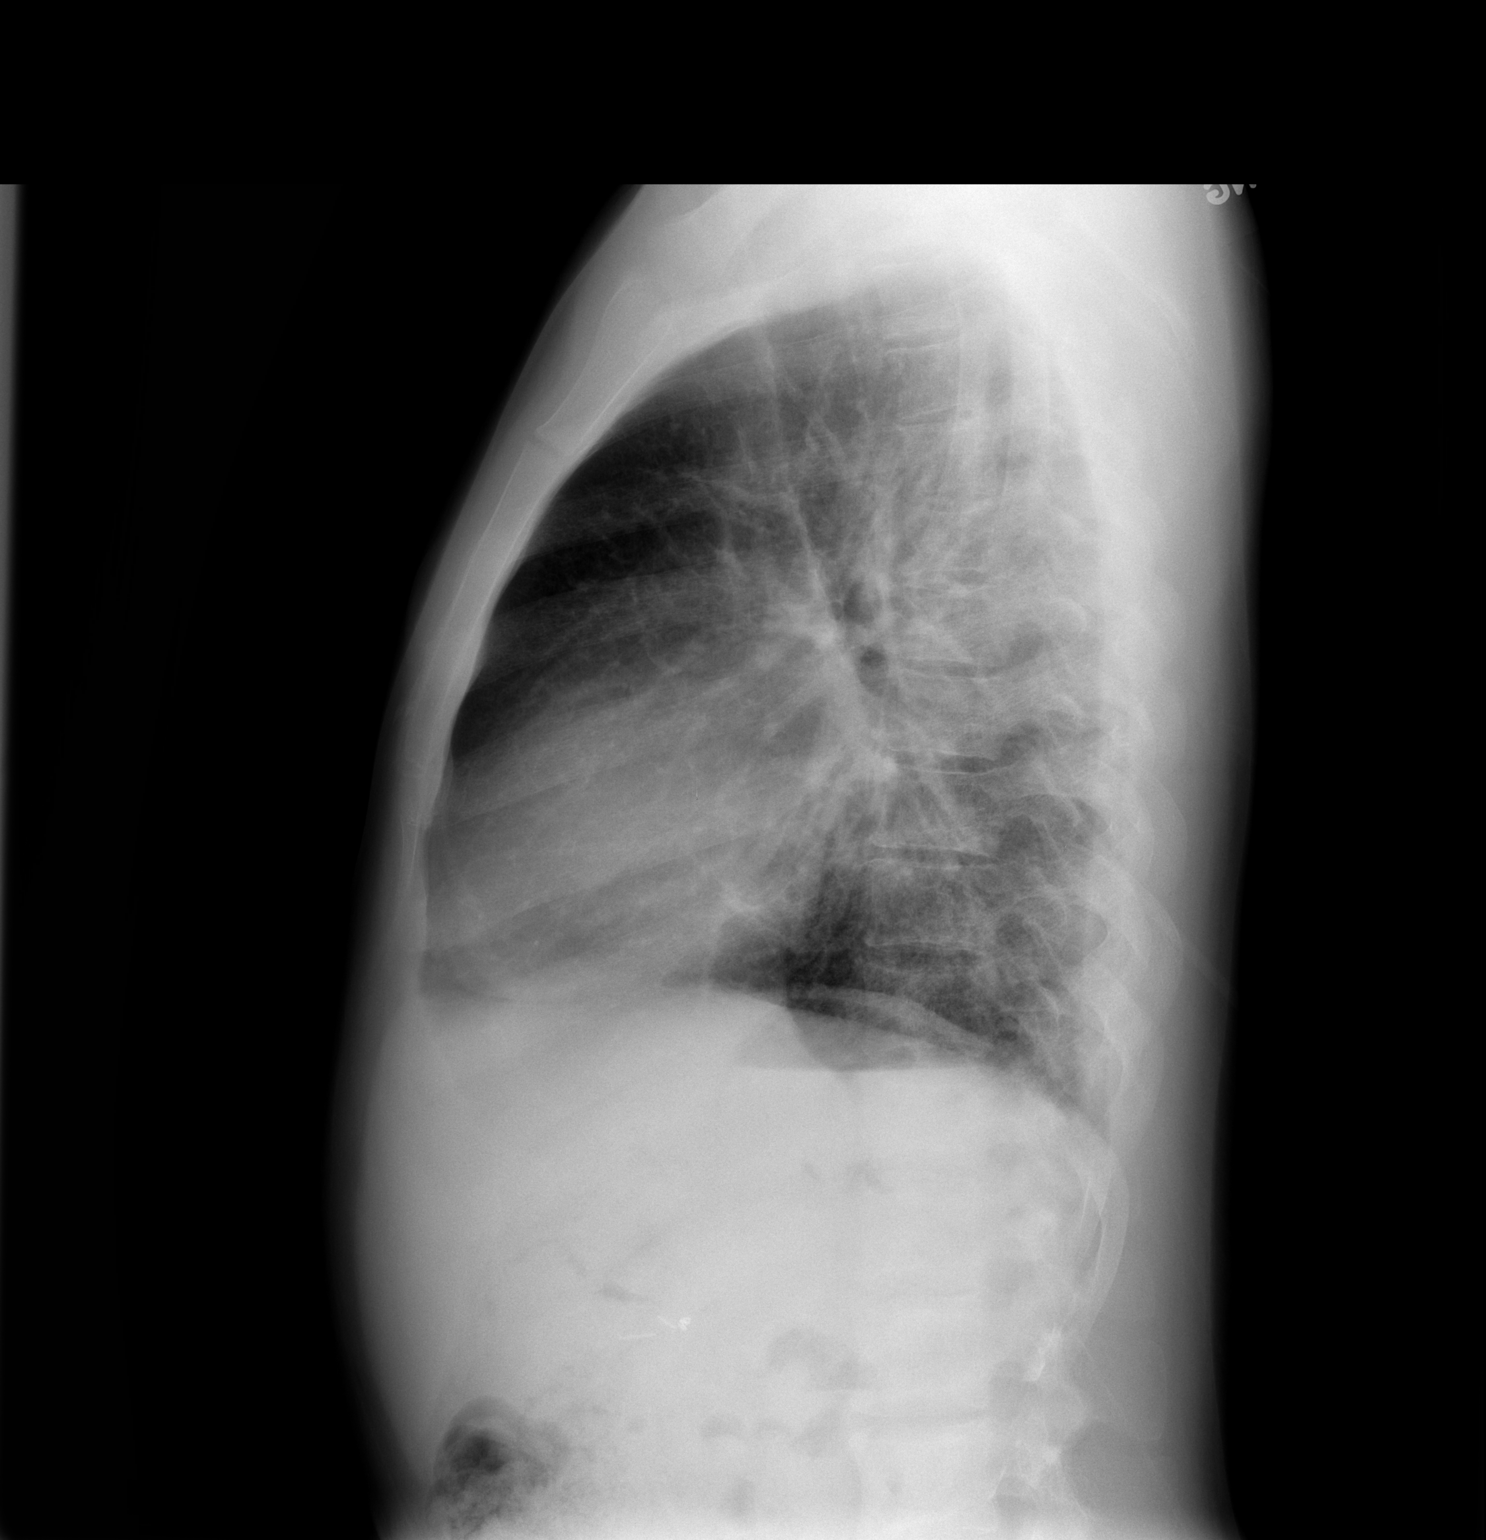

[2 of 2 positions shown; findings below may reference images not displayed]

FINDINGS: Normal cardiac silhouette.  There is increase in central
venous pulmonary congestion.  No focal consolidation evident.  No
pleural fluid.  No osseous abnormality.  Prior cholecystectomy.
Minimal chronic bronchitic change centrally.
IMPRESSION: Mild increased central venous congestion.

No evidence of focal infiltrate.

Minimal chronic bronchitic change centrally.

## 2014-03-19 ENCOUNTER — Encounter (HOSPITAL_COMMUNITY): Payer: Self-pay | Admitting: Emergency Medicine

## 2014-03-19 ENCOUNTER — Observation Stay (HOSPITAL_BASED_OUTPATIENT_CLINIC_OR_DEPARTMENT_OTHER)
Admission: EM | Admit: 2014-03-19 | Discharge: 2014-03-20 | Disposition: A | Discharge status: Home / Self Care | Payer: Medicare Other | Source: Home / Self Care | Attending: Family Medicine | Admitting: Family Medicine

## 2014-03-19 ENCOUNTER — Observation Stay (HOSPITAL_COMMUNITY): Payer: Medicare Other

## 2014-03-19 DIAGNOSIS — N189 Chronic kidney disease, unspecified: Secondary | ICD-10-CM

## 2014-03-19 DIAGNOSIS — Z79899 Other long term (current) drug therapy: Secondary | ICD-10-CM | POA: Insufficient documentation

## 2014-03-19 DIAGNOSIS — K219 Gastro-esophageal reflux disease without esophagitis: Secondary | ICD-10-CM | POA: Insufficient documentation

## 2014-03-19 DIAGNOSIS — N179 Acute kidney failure, unspecified: Secondary | ICD-10-CM | POA: Diagnosis not present

## 2014-03-19 DIAGNOSIS — E860 Dehydration: Secondary | ICD-10-CM | POA: Insufficient documentation

## 2014-03-19 DIAGNOSIS — G43909 Migraine, unspecified, not intractable, without status migrainosus: Secondary | ICD-10-CM

## 2014-03-19 DIAGNOSIS — D5700 Hb-SS disease with crisis, unspecified: Secondary | ICD-10-CM | POA: Insufficient documentation

## 2014-03-19 DIAGNOSIS — F1210 Cannabis abuse, uncomplicated: Secondary | ICD-10-CM | POA: Insufficient documentation

## 2014-03-19 DIAGNOSIS — Z87891 Personal history of nicotine dependence: Secondary | ICD-10-CM | POA: Insufficient documentation

## 2014-03-19 DIAGNOSIS — N184 Chronic kidney disease, stage 4 (severe): Secondary | ICD-10-CM | POA: Diagnosis present

## 2014-03-19 DIAGNOSIS — I959 Hypotension, unspecified: Secondary | ICD-10-CM | POA: Insufficient documentation

## 2014-03-19 DIAGNOSIS — F1410 Cocaine abuse, uncomplicated: Secondary | ICD-10-CM

## 2014-03-19 DIAGNOSIS — R0989 Other specified symptoms and signs involving the circulatory and respiratory systems: Secondary | ICD-10-CM | POA: Insufficient documentation

## 2014-03-19 DIAGNOSIS — F1910 Other psychoactive substance abuse, uncomplicated: Secondary | ICD-10-CM | POA: Insufficient documentation

## 2014-03-19 DIAGNOSIS — Z885 Allergy status to narcotic agent status: Secondary | ICD-10-CM

## 2014-03-19 LAB — COMPREHENSIVE METABOLIC PANEL
ALK PHOS: 127 U/L — AB (ref 39–117)
ALT: 21 U/L (ref 0–53)
ANION GAP: 7 (ref 5–15)
AST: 36 U/L (ref 0–37)
Albumin: 4.2 g/dL (ref 3.5–5.2)
BUN: 8 mg/dL (ref 6–23)
CALCIUM: 8.6 mg/dL (ref 8.4–10.5)
CO2: 23 mmol/L (ref 19–32)
Chloride: 109 mmol/L (ref 96–112)
Creatinine, Ser: 1.52 mg/dL — ABNORMAL HIGH (ref 0.50–1.35)
GFR calc Af Amer: 69 mL/min — ABNORMAL LOW (ref >90)
GFR, EST NON AFRICAN AMERICAN: 60 mL/min — AB (ref >90)
GLUCOSE: 142 mg/dL — AB (ref 70–99)
Potassium: 4.8 mmol/L (ref 3.5–5.1)
SODIUM: 139 mmol/L (ref 135–145)
Total Bilirubin: 2.8 mg/dL — ABNORMAL HIGH (ref 0.3–1.2)
Total Protein: 7 g/dL (ref 6.0–8.3)

## 2014-03-19 LAB — RAPID URINE DRUG SCREEN, HOSP PERFORMED
Amphetamines: NOT DETECTED
BARBITURATES: NOT DETECTED
BENZODIAZEPINES: NOT DETECTED
COCAINE: POSITIVE — AB
Opiates: POSITIVE — AB
Tetrahydrocannabinol: POSITIVE — AB

## 2014-03-19 LAB — CBC WITH DIFFERENTIAL/PLATELET
BASOS ABS: 0 10*3/uL (ref 0.0–0.1)
Basophils Relative: 0 % (ref 0–1)
Eosinophils Absolute: 0.2 10*3/uL (ref 0.0–0.7)
Eosinophils Relative: 2 % (ref 0–5)
HCT: 27.4 % — ABNORMAL LOW (ref 39.0–52.0)
Hemoglobin: 9.7 g/dL — ABNORMAL LOW (ref 13.0–17.0)
Lymphocytes Relative: 38 % (ref 12–46)
Lymphs Abs: 3.1 10*3/uL (ref 0.7–4.0)
MCH: 42.5 pg — ABNORMAL HIGH (ref 26.0–34.0)
MCHC: 35.4 g/dL (ref 30.0–36.0)
MCV: 120.2 fL — AB (ref 78.0–100.0)
MONO ABS: 0.6 10*3/uL (ref 0.1–1.0)
MONOS PCT: 7 % (ref 3–12)
NEUTROS PCT: 53 % (ref 43–77)
Neutro Abs: 4.3 10*3/uL (ref 1.7–7.7)
Platelets: 317 10*3/uL (ref 150–400)
RBC: 2.28 MIL/uL — ABNORMAL LOW (ref 4.22–5.81)
RDW: 17.3 % — ABNORMAL HIGH (ref 11.5–15.5)
WBC: 8.2 10*3/uL (ref 4.0–10.5)

## 2014-03-19 LAB — RETICULOCYTES
RBC.: 2.28 MIL/uL — ABNORMAL LOW (ref 4.22–5.81)
Retic Count, Absolute: 187 10*3/uL — ABNORMAL HIGH (ref 19.0–186.0)
Retic Ct Pct: 8.2 % — ABNORMAL HIGH (ref 0.4–3.1)

## 2014-03-19 MED ORDER — OXYCODONE HCL 5 MG PO TABS
5.0000 mg | ORAL_TABLET | Freq: Three times a day (TID) | ORAL | Status: DC | PRN
  Administered 2014-03-19 – 2014-03-20 (×2): 5 mg via ORAL
  Filled 2014-03-19 (×2): qty 1

## 2014-03-19 MED ORDER — HEPARIN SODIUM (PORCINE) 5000 UNIT/ML IJ SOLN
5000.0000 [IU] | Freq: Three times a day (TID) | INTRAMUSCULAR | Status: DC
  Administered 2014-03-19 – 2014-03-20 (×3): 5000 [IU] via SUBCUTANEOUS
  Filled 2014-03-19 (×4): qty 1

## 2014-03-19 MED ORDER — HYDROXYUREA 500 MG PO CAPS
1500.0000 mg | ORAL_CAPSULE | Freq: Every day | ORAL | Status: DC
Start: 2014-03-20 — End: 2014-03-20
  Administered 2014-03-20: 1500 mg via ORAL
  Filled 2014-03-19: qty 3

## 2014-03-19 MED ORDER — FENTANYL CITRATE 0.05 MG/ML IJ SOLN
50.0000 ug | Freq: Once | INTRAMUSCULAR | Status: AC
  Administered 2014-03-19: 50 ug via INTRAVENOUS
  Filled 2014-03-19: qty 2

## 2014-03-19 MED ORDER — KETOROLAC TROMETHAMINE 30 MG/ML IJ SOLN
30.0000 mg | Freq: Once | INTRAMUSCULAR | Status: AC
  Administered 2014-03-19: 30 mg via INTRAVENOUS
  Filled 2014-03-19: qty 1

## 2014-03-19 MED ORDER — ONDANSETRON HCL 4 MG PO TABS: 4.0000 mg | ORAL_TABLET | Freq: Four times a day (QID) | ORAL | Status: DC | PRN

## 2014-03-19 MED ORDER — OXYCODONE-ACETAMINOPHEN 5-325 MG PO TABS
1.0000 | ORAL_TABLET | Freq: Three times a day (TID) | ORAL | Status: DC | PRN
Start: 2014-03-19 — End: 2014-03-20
  Administered 2014-03-19 – 2014-03-20 (×2): 1 via ORAL
  Filled 2014-03-19 (×2): qty 1

## 2014-03-19 MED ORDER — ACETAMINOPHEN 325 MG PO TABS: 650.0000 mg | ORAL_TABLET | Freq: Four times a day (QID) | ORAL | Status: DC | PRN

## 2014-03-19 MED ORDER — ONDANSETRON HCL 4 MG/2ML IJ SOLN: 4.0000 mg | Freq: Four times a day (QID) | INTRAMUSCULAR | Status: DC | PRN

## 2014-03-19 MED ORDER — HYDROMORPHONE HCL 1 MG/ML IJ SOLN
0.5000 mg | INTRAMUSCULAR | Status: DC | PRN
  Administered 2014-03-19: 0.5 mg via INTRAVENOUS
  Filled 2014-03-19: qty 1

## 2014-03-19 MED ORDER — KETOROLAC TROMETHAMINE 30 MG/ML IJ SOLN
30.0000 mg | Freq: Four times a day (QID) | INTRAMUSCULAR | Status: DC
  Administered 2014-03-20 (×4): 30 mg via INTRAVENOUS
  Filled 2014-03-19 (×7): qty 1

## 2014-03-19 MED ORDER — SODIUM CHLORIDE 0.9 % IV BOLUS (SEPSIS)
1000.0000 mL | Freq: Once | INTRAVENOUS | Status: AC
  Administered 2014-03-19: 1000 mL via INTRAVENOUS

## 2014-03-19 MED ORDER — OXYCODONE-ACETAMINOPHEN 5-325 MG PO TABS
1.0000 | ORAL_TABLET | Freq: Once | ORAL | Status: AC
Start: 2014-03-19 — End: 2014-03-19
  Administered 2014-03-19: 1 via ORAL
  Filled 2014-03-19: qty 1

## 2014-03-19 MED ORDER — ACETAMINOPHEN 650 MG RE SUPP: 650.0000 mg | Freq: Four times a day (QID) | RECTAL | Status: DC | PRN

## 2014-03-19 MED ORDER — SODIUM CHLORIDE 0.9 % IV SOLN: 250.0000 mL | INTRAVENOUS | Status: DC | PRN

## 2014-03-19 MED ORDER — SODIUM CHLORIDE 0.9 % IJ SOLN
3.0000 mL | Freq: Two times a day (BID) | INTRAMUSCULAR | Status: DC
  Administered 2014-03-19: 3 mL via INTRAVENOUS

## 2014-03-19 MED ORDER — NORTRIPTYLINE HCL 25 MG PO CAPS
25.0000 mg | ORAL_CAPSULE | Freq: Every day | ORAL | Status: DC
  Administered 2014-03-19: 25 mg via ORAL
  Filled 2014-03-19 (×2): qty 1

## 2014-03-19 MED ORDER — SENNA 8.6 MG PO TABS
1.0000 | ORAL_TABLET | Freq: Two times a day (BID) | ORAL | Status: DC
  Filled 2014-03-19 (×3): qty 1

## 2014-03-19 MED ORDER — FENTANYL CITRATE 0.05 MG/ML IJ SOLN: 100.0000 ug | Freq: Once | INTRAMUSCULAR | Status: DC

## 2014-03-19 MED ORDER — OXYCODONE-ACETAMINOPHEN 10-325 MG PO TABS: 1.0000 | ORAL_TABLET | Freq: Three times a day (TID) | ORAL | Status: DC | PRN

## 2014-03-19 MED ORDER — SODIUM CHLORIDE 0.9 % IJ SOLN: 3.0000 mL | INTRAMUSCULAR | Status: DC | PRN

## 2014-03-19 MED ORDER — HYDROMORPHONE HCL 1 MG/ML IJ SOLN
0.5000 mg | INTRAMUSCULAR | Status: DC | PRN
  Administered 2014-03-20 (×4): 0.5 mg via INTRAVENOUS
  Filled 2014-03-19 (×4): qty 1

## 2014-03-19 MED ORDER — HYDROMORPHONE HCL 1 MG/ML IJ SOLN
1.0000 mg | INTRAMUSCULAR | Status: DC | PRN
  Administered 2014-03-19 (×2): 1 mg via INTRAVENOUS
  Filled 2014-03-19 (×3): qty 1

## 2014-03-19 NOTE — ED Provider Notes (Signed)
CSN: [id number]     Arrival date & time 03/19/14  1132 History   First MD Initiated Contact with Patient 03/19/14 1314     Chief Complaint  Patient presents with  . Sickle Cell Pain Crisis     (Consider location/radiation/quality/duration/timing/severity/associated sxs/prior Treatment) HPI Kristofer A Kartel Wohlwend. is a 34 y.o. male with sickle cell disease, presents to emergency department complaining of pain in his feet and his legs. States this is typical of his sickle cell crisis. States pain started yesterday. He denies any fever, chills, chest pain, shortness of breath, malaise. He states that his primary care doctors at [facility], he is to go to family practice clinic but hasn't been in a while, and states he is out of his pain medications. He took 2 Tylenols at home with no relief of his symptoms. He reports his last sickle cell crisis was one month ago. He states he believes the whether is was making his crisis come on. He has no other complaints. Nothing is making his symptoms better or worse.  Past Medical History  Diagnosis Date  . Sickle cell anemia   . Sickle cell crisis 09/25/2012  . Bacterial pneumonia ~ 2012    "caught it here in the hospital" (09/25/2012)  . History of blood transfusion     "always related to sickle cell crisis" (09/25/2012)  . GERD (gastroesophageal reflux disease)     "after I eat alot of spicey foods" (09/25/2012)  . Migraines     "take RX qd to prevent them" (09/25/2012)  . Cyst of brain     "2 really small ones in the back of my head; inside; saw them w/MRI" (09/25/2012)  . Chronic kidney disease     "from my sickle cell" (09/25/2012)   Past Surgical History  Procedure Laterality Date  . Cholecystectomy  ~ 2012   History reviewed. No pertinent family history. History  Substance Use Topics  . Smoking status: Former Smoker    Types: Cigarettes  . Smokeless tobacco: Never Used     Comment: 09/25/2012 "I don't buy cigarettes; bum one from friends q now and  then"  . Alcohol Use: Yes     Comment: occasionally    Review of Systems  Constitutional: Negative for fever and chills.  Respiratory: Negative for cough, chest tightness and shortness of breath.   Cardiovascular: Negative for chest pain, palpitations and leg swelling.  Gastrointestinal: Negative for nausea, vomiting, abdominal pain, diarrhea and abdominal distention.  Genitourinary: Negative for dysuria, urgency, frequency and hematuria.  Musculoskeletal: Positive for myalgias and arthralgias. Negative for neck pain and neck stiffness.  Skin: Negative for rash.  Neurological: Negative for dizziness, weakness, light-headedness, numbness and headaches.  All other systems reviewed and are negative.     Allergies  Morphine and related  Home Medications   Prior to Admission medications   Medication Sig Start Date End Date Taking? Authorizing Provider  acetaminophen (TYLENOL) 325 MG tablet Take 650 mg by mouth every 6 (six) hours as needed for mild pain.   Yes Historical Provider, MD  enalapril (VASOTEC) 5 MG tablet Take 1 tablet (5 mg total) by mouth daily. 12/16/13  Yes Leone Brand, MD  hydroxyurea (HYDREA) 500 MG capsule Take 3 capsules (1,500 mg total) by mouth daily. 09/22/13  Yes Donnamae Jude, MD  nortriptyline (PAMELOR) 25 MG capsule Take 1 capsule (25 mg total) by mouth at bedtime. 09/22/13  Yes Donnamae Jude, MD  oxyCODONE-acetaminophen (PERCOCET) 10-325 MG per tablet Take  1 tablet by mouth 3 (three) times daily as needed. pain 03/06/14  Yes Historical Provider, MD  diclofenac (VOLTAREN) 75 MG EC tablet Take 1 tablet (75 mg total) by mouth 2 (two) times daily. Patient not taking: Reported on 02/23/2014 09/22/13   Donnamae Jude, MD  Oxycodone HCl 10 MG TABS Take 1 tablet (10 mg total) by mouth every 4 (four) hours as needed. Patient not taking: Reported on 03/19/2014 02/23/14   Dot Lanes, MD  traMADol (ULTRAM) 50 MG tablet Take 1 tablet (50 mg total) by mouth every 8 (eight)  hours as needed. Patient not taking: Reported on 02/23/2014 09/22/13   Donnamae Jude, MD   BP 102/61 mmHg  Pulse 96  Temp(Src) 98.2 F (36.8 C) (Oral)  Resp 16  SpO2 97% Physical Exam  Constitutional: He appears well-developed and well-nourished. No distress.  HENT:  Head: Normocephalic.  Neck: Normal range of motion. Neck supple.  Cardiovascular: Normal rate, regular rhythm and normal heart sounds.   Pulmonary/Chest: Effort normal and breath sounds normal. No respiratory distress. He has no wheezes. He has no rales.  Abdominal: Soft. There is no tenderness.  Musculoskeletal:  Normal-appearing bilateral lower extremities. No tenderness palpation over her ankle and knee, hip joints. DP pulses intact and equal bilaterally. Full range of motion of bilateral lower extremities.  Skin: Skin is warm and dry.  Nursing note and vitals reviewed.   ED Course  Procedures (including critical care time) Labs Review Labs Reviewed  CBC WITH DIFFERENTIAL/PLATELET - Abnormal; Notable for the following:    RBC 2.28 (*)    Hemoglobin 9.7 (*)    HCT 27.4 (*)    MCV 120.2 (*)    MCH 42.5 (*)    RDW 17.3 (*)    All other components within normal limits  COMPREHENSIVE METABOLIC PANEL - Abnormal; Notable for the following:    Glucose, Bld 142 (*)    Creatinine, Ser 1.52 (*)    Alkaline Phosphatase 127 (*)    Total Bilirubin 2.8 (*)    GFR calc non Af Amer 60 (*)    GFR calc Af Amer 69 (*)    All other components within normal limits  RETICULOCYTES - Abnormal; Notable for the following:    Retic Ct Pct 8.2 (*)    RBC. 2.28 (*)    Retic Count, Manual 187.0 (*)    All other components within normal limits    Imaging Review No results found.   EKG Interpretation None      MDM   Final diagnoses:  Sickle cell pain crisis    1:42 PM Patient seen and examined. Patient was sickle cell crisis, onset last night. Took Tylenol with no relief. He is afebrile, nontoxic appearing. Pain is in  the lower extremities. No chest pain, no shortness of breath, no evidence of acute chest syndrome. Will start IV Dilaudid, IV fluids. Labs at baseline.  4:04 PM Patient has had mild relief in pain with Dilaudid. We'll try fentanyl. Patient asking for Toradol, he already received a dose of Toradol. Explained cannot give more at this time. Will reassess after medications.   Pt signed out to PA Harris at shift change. Disposition based on pt's pain control    Renold Genta, PA-C 03/20/14 0730  Shaune Pollack, MD 03/20/14 1013

## 2014-03-19 NOTE — ED Provider Notes (Signed)
5:56 PM BP 103/57 mmHg  Pulse 76  Temp(Src) 98.2 F (36.8 C) (Oral)  Resp 16  SpO2 97% Assumed care of the patient from [place]. Patient with sickle cell anemia. Labs appear stable. Patient here for pain. At this time. His pain is well controlled. He wishes to be discharged. I was not involved in the medical decision making of this patient's care. Patient states he has not had any pain medication at home and would like to be discharged with some pain medication. I reviewed the [place] controlled substances reporting system and see that the patient got 30 days worth of Percocet tens 12 days ago. I have declined to give him extra pain medication at this time. He may follow up with his primary care physician, Dr.  Noah Delaine if he needs further pain control.   6:25 PM Patient gave push back to the nurses at discharge, stating that previous PA told him he could get pain medication. I brought in the printout of the controlled substance report to show him that he had gotten 90 oxycodone 10 (30 days worth of pain meds) 12 days ago. Patient states that he has used all of his medication because he was in painand now he is refusing to be because his pain is too bad and I have told him that there is no right any narcotic medications for him to go home with. Considering his overuse of the medication. He states that his pain is too bad to go home without pain medication. I will call for admission for pain control.  Pt stable in ED with no significant deterioration in condition.  Margarita Mail, PA-C 03/19/14 [place], PA-C 03/19/14 1816  Margarita Mail, PA-C 03/20/14 1347  Wandra Arthurs, MD 03/22/14 [phone]

## 2014-03-19 NOTE — ED Notes (Signed)
Transporting patient to new room assignment. 

## 2014-03-19 NOTE — H&P (Signed)
[facility] Admission History and Physical Service Pager: [phone]  Patient name: [name] [name]. Medical record number: [id number] Date of birth: [birth year] Age: 34 y.o. Gender: male  Primary Care Provider: Tawanna Sat, MD Consultants: None Code Status: Full  Chief Complaint: Sickle Cell Pain Crisis  Assessment and Plan: Vineet Hold. is a 34 y.o. male presenting with sickle cell pain crisis. PMH is significant for sickle cell SS anemia, CKD, and GERD.  # Sickle Cell Pain Crisis- History of Sickle Cell SS. Home medication of Percocet 10-325 TID, which he takes five at a time. HR normal. Hypotensive. Hemoglobin 9.7 (baseline 8). Reticulocytes 8.2 (8). Fentanyl 86mcg, Toradol 30mg , and Dilaudid 1mg  x2 given in ED. Multiple UDS positive for cocaine and THC multiple times in past with last UDS in 12/2013. [facility] agreed to not prescribe narcotics as outpatient in 2010. - Placed in observation, [place] attending - Follow up UDS - Follow up CXR (considering crackles but he denies dyspnea, cough, chest pain to indicate acute chest) - Continue home medication as prescribed for moderate pain. - Acetaminophen PRN mild pain, Toradol 30mg  IV q6hr, Dilaudid 0.5mg  q4hr PRN severe pain  - Monitor BP. Discontinue dilaudid if continues to drop. -  Senna 8.6mg  BID - K pad - Continue hydroxyurea  # Chronic Kidney Disease - Creatinine of 1.52 (Baseline 1.3-1.4) - Follow up am labs - If worsens, consider transition off of Tramadol - Continue to monitor  FEN/GI: Regular Diet, Saline Lock Prophylaxis: SQ Heparin  Disposition: Discharge home.  History of Present Illness: [name] [name]. is a 34 y.o. male presenting with worsening bilateral lower extremity pain since yesterday morning. States cold weather has made pain worse. Denies chest pain. Denies difficulty breathing. Denies cold symptoms. Denies fever. Denies sick contacts. Denies urinary symptoms.  Currently prescribed Percocet 10-325mg  TID, which he states he takes five of at a time. Still has some prescription medications at home. Per ED PA, who reviewed the [place] controlled substances reporting system, he was given a prescription for 30days of Percocet twelve days ago and requested more pain medications. He has also been obtaining prescriptions from multiple providers. States he normally obtains his pain prescriptions from Dr. Noah Delaine and from a clinic in [place]. Has attempted to go to [facility] but states he is always sent to [facility]. No further complaints today.  Review Of Systems: Per HPI  Otherwise 12 point review of systems was performed and was unremarkable.  Patient Active Problem List   Diagnosis Date Noted  . Sickle cell crisis 03/19/2014  . Sickle cell anemia with pain 03/19/2014  . Sickle cell anemia with crisis   . Other iron deficiency anemias   . Chronic kidney disease   . Vaso-occlusive sickle cell crisis 05/09/2013  . Sickle cell pain crisis 01/24/2013  . Cocaine abuse 09/26/2012  . Acute-on-chronic kidney injury 09/26/2012  . Hyperkalemia 09/26/2012  . Systolic murmur [id number]  . Migraine 11/26/2009  . CHRONIC KIDNEY DISEASE STAGE II (MILD) 03/06/2009  . Sickle cell disease, type SS. No outpatient prescriptions from [facility] due to cocaine positive, opiate negative on high doses of given narcotics 06/18/2008  . TOBACCO ABUSE 05/22/2007   Past Medical History: Past Medical History  Diagnosis Date  . Sickle cell anemia   . Sickle cell crisis 09/25/2012  . Bacterial pneumonia ~ 2012    "caught it here in the hospital" (09/25/2012)  . History of blood transfusion     "  always related to sickle cell crisis" (09/25/2012)  . GERD (gastroesophageal reflux disease)     "after I eat alot of spicey foods" (09/25/2012)  . Migraines     "take RX qd to prevent them" (09/25/2012)  . Cyst of brain     "2 really small ones in the back of my head; inside;  saw them w/MRI" (09/25/2012)  . Chronic kidney disease     "from my sickle cell" (09/25/2012)   Past Surgical History: Past Surgical History  Procedure Laterality Date  . Cholecystectomy  ~ 2012   Social History: History  Substance Use Topics  . Smoking status: Former Smoker    Types: Cigarettes  . Smokeless tobacco: Never Used     Comment: 09/25/2012 "I don't buy cigarettes; bum one from friends q now and then"  . Alcohol Use: Yes     Comment: occasionally   Please also refer to relevant sections of EMR.  Family History: History reviewed. No pertinent family history. Allergies and Medications: Allergies  Allergen Reactions  . Morphine And Related Other (See Comments)    "real bad headaches"   No current facility-administered medications on file prior to encounter.   Current Outpatient Prescriptions on File Prior to Encounter  Medication Sig Dispense Refill  . enalapril (VASOTEC) 5 MG tablet Take 1 tablet (5 mg total) by mouth daily. 30 tablet 3  . hydroxyurea (HYDREA) 500 MG capsule Take 3 capsules (1,500 mg total) by mouth daily. 90 capsule 5  . nortriptyline (PAMELOR) 25 MG capsule Take 1 capsule (25 mg total) by mouth at bedtime. 30 capsule 11  . diclofenac (VOLTAREN) 75 MG EC tablet Take 1 tablet (75 mg total) by mouth 2 (two) times daily. (Patient not taking: Reported on 02/23/2014) 30 tablet 0  . Oxycodone HCl 10 MG TABS Take 1 tablet (10 mg total) by mouth every 4 (four) hours as needed. (Patient not taking: Reported on 03/19/2014) 30 tablet 0  . traMADol (ULTRAM) 50 MG tablet Take 1 tablet (50 mg total) by mouth every 8 (eight) hours as needed. (Patient not taking: Reported on 02/23/2014) 30 tablet 1    Objective: BP 91/57 mmHg  Pulse 68  Temp(Src) 98.2 F (36.8 C) (Oral)  Resp 15  SpO2 100% Exam: General: 33yo male resting comfortably in no apparent distress HEENT: Mucous membranes moist. PERRLA. Cardiovascular: S1 and S2 noted. No murmurs/rubs/gallops. Regular  rate and rhythm. Respiratory: Crackles in dependent lung (L sided crackles while laying on L side), No wheezes noted. No increased work of breathing. Abdomen: Bowel sounds noted. Soft and nondistended. No tenderness to palpation.  Extremities: No edema noted. Tenderness to palpation over both legs and feet bilaterally. Pulses palpated. Skin: No rashes noted. Warm. Neuro: No focal deficits noted. AAOx4.  Labs and Imaging: CBC BMET   Recent Labs Lab 03/19/14 1202  WBC 8.2  HGB 9.7*  HCT 27.4*  PLT 317    Recent Labs Lab 03/19/14 1202  NA 139  K 4.8  CL 109  CO2 23  BUN 8  CREATININE 1.52*  GLUCOSE 142*  CALCIUM 8.6     [address] [place], DO 03/19/2014, 8:12 PM PGY-1, [place] Intern pager: [phone], text pages welcome   I have seen and examined the patient with Dr. Gerlean Ren and I agree with her documentation above. My annotations are in blue.   Laroy Apple, MD [place] Resident, PGY-3 03/19/2014, 10:24 PM

## 2014-03-19 NOTE — ED Notes (Signed)
When patient was informed that he would not be receiving a prescription for pain medication, he decided that he would like to stay and said this in a rude way to the physician assistant.  Patient had previously been asked if he would be able to go home and he had said yes at that time.

## 2014-03-19 NOTE — ED Notes (Signed)
Pt states he has a sickle cell crisis, hurts in his feet, lower back and legs.  Started yesterday evening.  Denies cp, sob, h/a, or changes in vision.

## 2014-03-19 NOTE — ED Notes (Signed)
Pt c/o back, leg and foot pain from sickle cell crisis; pt sts does not have any home pain meds and pain started last night

## 2014-03-20 DIAGNOSIS — D5700 Hb-SS disease with crisis, unspecified: Secondary | ICD-10-CM

## 2014-03-20 DIAGNOSIS — I959 Hypotension, unspecified: Secondary | ICD-10-CM

## 2014-03-20 DIAGNOSIS — N182 Chronic kidney disease, stage 2 (mild): Secondary | ICD-10-CM

## 2014-03-20 DIAGNOSIS — F1910 Other psychoactive substance abuse, uncomplicated: Secondary | ICD-10-CM | POA: Insufficient documentation

## 2014-03-20 DIAGNOSIS — R0989 Other specified symptoms and signs involving the circulatory and respiratory systems: Secondary | ICD-10-CM

## 2014-03-20 LAB — BASIC METABOLIC PANEL
ANION GAP: 8 (ref 5–15)
BUN: 12 mg/dL (ref 6–23)
CHLORIDE: 112 mmol/L (ref 96–112)
CO2: 21 mmol/L (ref 19–32)
CREATININE: 1.6 mg/dL — AB (ref 0.50–1.35)
Calcium: 8.7 mg/dL (ref 8.4–10.5)
GFR calc non Af Amer: 56 mL/min — ABNORMAL LOW (ref >90)
GFR, EST AFRICAN AMERICAN: 65 mL/min — AB (ref >90)
Glucose, Bld: 90 mg/dL (ref 70–99)
POTASSIUM: 4.7 mmol/L (ref 3.5–5.1)
Sodium: 141 mmol/L (ref 135–145)

## 2014-03-20 LAB — CBC WITH DIFFERENTIAL/PLATELET
Basophils Absolute: 0 10*3/uL (ref 0.0–0.1)
Basophils Relative: 0 % (ref 0–1)
EOS ABS: 0.3 10*3/uL (ref 0.0–0.7)
Eosinophils Relative: 3 % (ref 0–5)
HCT: 25.1 % — ABNORMAL LOW (ref 39.0–52.0)
Hemoglobin: 9.1 g/dL — ABNORMAL LOW (ref 13.0–17.0)
LYMPHS ABS: 5 10*3/uL — AB (ref 0.7–4.0)
LYMPHS PCT: 53 % — AB (ref 12–46)
MCH: 44.8 pg — ABNORMAL HIGH (ref 26.0–34.0)
MCHC: 36.3 g/dL — AB (ref 30.0–36.0)
MCV: 123.6 fL — AB (ref 78.0–100.0)
MONOS PCT: 7 % (ref 3–12)
Monocytes Absolute: 0.7 10*3/uL (ref 0.1–1.0)
Neutro Abs: 3.5 10*3/uL (ref 1.7–7.7)
Neutrophils Relative %: 37 % — ABNORMAL LOW (ref 43–77)
PLATELETS: 271 10*3/uL (ref 150–400)
RBC: 2.03 MIL/uL — AB (ref 4.22–5.81)
RDW: 18.3 % — ABNORMAL HIGH (ref 11.5–15.5)
WBC: 9.5 10*3/uL (ref 4.0–10.5)

## 2014-03-20 LAB — CBC
HCT: 23.1 % — ABNORMAL LOW (ref 39.0–52.0)
Hemoglobin: 8.2 g/dL — ABNORMAL LOW (ref 13.0–17.0)
MCH: 42.9 pg — ABNORMAL HIGH (ref 26.0–34.0)
MCHC: 35.5 g/dL (ref 30.0–36.0)
MCV: 120.9 fL — ABNORMAL HIGH (ref 78.0–100.0)
Platelets: 261 10*3/uL (ref 150–400)
RBC: 1.91 MIL/uL — ABNORMAL LOW (ref 4.22–5.81)
RDW: 18 % — ABNORMAL HIGH (ref 11.5–15.5)
WBC: 12.6 10*3/uL — AB (ref 4.0–10.5)

## 2014-03-20 LAB — RETICULOCYTES
RBC.: 4.08 MIL/uL — ABNORMAL LOW (ref 4.22–5.81)
RBC.: 2.03 MIL/uL — ABNORMAL LOW (ref 4.22–5.81)
RETIC CT PCT: 1.5 % (ref 0.4–3.1)
Retic Count, Absolute: 61.2 10*3/uL (ref 19.0–186.0)
Retic Count, Absolute: 170.5 10*3/uL (ref 19.0–186.0)
Retic Ct Pct: 8.4 % — ABNORMAL HIGH (ref 0.4–3.1)

## 2014-03-20 MED ORDER — OXYCODONE HCL 10 MG PO TABS: 10.0000 mg | ORAL_TABLET | ORAL | Status: DC | PRN

## 2014-03-20 NOTE — Progress Notes (Signed)
Family Medicine Teaching Service Daily Progress Note Intern Pager: [phone]  Patient name: [name] [name]. Medical record number: [id number] Date of birth: 1980/12/31 Age: 34 y.o. Gender: male  Primary Care Provider: Tawanna Sat, MD Consultants: None Code Status: Full  Pt Overview and Major Events to Date:    Assessment and Plan: [name] Pellett. is a 34 y.o. male presenting with sickle cell pain crisis. PMH is significant for sickle cell SS anemia, CKD, and GERD.  # Sickle Cell Pain Crisis- History of Sickle Cell SS. Home medication of Percocet 10-325 TID, which he takes five at a time. HR normal. Hypotensive. Hemoglobin 9.7 (baseline 8). Reticulocytes 8.2 (8). Fentanyl 38mcg, Toradol 30mg , and Dilaudid 1mg  x2 given in ED. Multiple UDS positive for cocaine and THC multiple times in past with last UDS in 12/2013. [facility] agreed to not prescribe narcotics as outpatient in 2010. Improving.  - Placed in observation - UDS with cocaine, opiates, Marijuana - Follow up CXR (considering crackles but he denies dyspnea, cough, chest pain to indicate acute chest) - Continue home medication as prescribed for moderate pain. - Acetaminophen PRN mild pain, Toradol 30mg  IV q6hr, Dilaudid 0.5mg  q4hr PRN severe pain - Monitor BP. Discontinue dilaudid if drops. . - Senna 8.6mg  BID - K pad - Continue hydroxyurea for now.   # Chronic Kidney Disease - Creatinine of 1.52 (Baseline 1.3-1.4) - Cr 1.6 will give fluids. Recheck in the am.  - If worsens, consider transition off of Toradol - Continue to monitor  FEN/GI: Regular Diet, Saline Lock Prophylaxis: SQ Heparin*  Subjective:  Pt. Feels that his pain is better controlled, but says that it is 7/10. He has had a BM yesterday. He has no SOB, No CP, No fevers or chills. Feels sleepy.   Objective: Temp:  [97.7 F (36.5 C)-98.6 F (37 C)] 97.7 F (36.5 C) (02/05 0526) Pulse Rate:  [66-100] 85 (02/05 0526) Resp:  [11-26] 14  (02/05 0526) BP: (90-113)/(41-87) 95/61 mmHg (02/05 0526) SpO2:  [95 %-100 %] 95 % (02/05 0526) Weight:  [125 lb 14.1 oz (57.1 kg)] 125 lb 14.1 oz (57.1 kg) (02/04 2103) Physical Exam: General: NAD, AAOx3 Cardiovascular: RRR, no MGR, Nl S1/S2.  Respiratory: CTA Bilatearlly  Abdomen: S, NT, ND, +BS Extremities: WWP, Mild TTP with squeezing his lower extremities. Did not withdraw to pain. Pain improving to exam.   Laboratory:  Recent Labs Lab 03/19/14 1202 03/20/14 0432  WBC 8.2 12.6*  HGB 9.7* 8.2*  HCT 27.4* 23.1*  PLT 317 261    Recent Labs Lab 03/19/14 1202 03/20/14 0432  NA 139 141  K 4.8 4.7  CL 109 112  CO2 23 21  BUN 8 12  CREATININE 1.52* 1.60*  CALCIUM 8.6 8.7  PROT 7.0  --   BILITOT 2.8*  --   ALKPHOS 127*  --   ALT 21  --   AST 36  --   GLUCOSE 142* 90    UTox - positive for opiates, cocaine, THC.   CXR - negative.     Aquilla Hacker, MD 03/20/2014, 8:46 AM PGY-1, [place] Intern pager: [phone], text pages welcome

## 2014-03-20 NOTE — Progress Notes (Signed)
Nutrition Brief Note  Patient identified on the Malnutrition Screening Tool (MST) Report  Wt Readings from Last 15 Encounters:  03/19/14 125 lb 14.1 oz (57.1 kg)  12/28/13 125 lb (56.7 kg)  11/12/13 129 lb 12.8 oz (58.877 kg)  09/22/13 126 lb (57.153 kg)  07/29/13 133 lb 14.4 oz (60.737 kg)  05/09/13 133 lb 9.6 oz (60.6 kg)  05/09/13 129 lb 8 oz (58.741 kg)  05/01/13 128 lb (58.06 kg)  04/29/13 132 lb (59.875 kg)  01/25/13 137 lb 11.2 oz (62.46 kg)  01/22/13 132 lb (59.875 kg)  10/11/12 120 lb (54.432 kg)  09/26/12 123 lb 0.3 oz (55.8 kg)  09/13/12 121 lb (54.885 kg)  08/30/12 118 lb (53.524 kg)   34 Y/O M with PMX of sickle cell anemia, CKD, substance abuse, presented with worsening generalized body ache that started two days ago, he is normally on Percocet at home as needed for pain but this was not helping. He denies any recent URI; he believes the change in weather triggered his pain. His pain has improved some, but still have a moderate pain in his feet.  Pt asleep at time of visit and would not arouse not name being called. Chart review. Nutrition screen indicates pt with good appetite PTA. Noted wt fluctuations between 125-133# within the past year. Weight changes not significant for time frame. Noted pt with hx of polysubstance abuse and drug seeking behavior.   Body mass index is 21.6 kg/(m^2). Patient meets criteria for normal weight range based on current BMI.   Current diet order is regular, patient is consuming approximately n/a% of meals at this time. Labs and medications reviewed.   No nutrition interventions warranted at this time. If nutrition issues arise, please consult RD.   Shadie Sweatman A. Jimmye Norman, RD, LDN, CDE Pager: [phone] After hours Pager: [phone]

## 2014-03-20 NOTE — Progress Notes (Signed)
Pt discharge to home. Discharge instructions given,pt understood and no verbalized questions.

## 2014-03-20 NOTE — Progress Notes (Signed)
UR completed 

## 2014-03-20 NOTE — Discharge Instructions (Signed)
Sickle Cell Anemia, Adult °Sickle cell anemia is a condition in which red blood cells have an abnormal "sickle" shape. This abnormal shape shortens the cells' life span, which results in a lower than normal concentration of red blood cells in the blood. The sickle shape also causes the cells to clump together and block free blood flow through the blood vessels. As a result, the tissues and organs of the body do not receive enough oxygen. Sickle cell anemia causes organ damage and pain and increases the risk of infection. °CAUSES  °Sickle cell anemia is a genetic disorder. Those who receive two copies of the gene have the condition, and those who receive one copy have the trait. °RISK FACTORS °The sickle cell gene is most common in people whose families originated in [place]. Other areas of the globe where sickle cell trait occurs include the Mediterranean, [place] and [place], the [place], and the [place].  °SIGNS AND SYMPTOMS °· Pain, especially in the extremities, back, chest, or abdomen (common). The pain may start suddenly or may develop following an illness, especially if there is dehydration. Pain can also occur due to overexertion or exposure to extreme temperature changes. °· Frequent severe bacterial infections, especially certain types of pneumonia and meningitis. °· Pain and swelling in the hands and feet. °· Decreased activity.   °· Loss of appetite.   °· Change in behavior. °· Headaches. °· Seizures. °· Shortness of breath or difficulty breathing. °· Vision changes. °· Skin ulcers. °Those with the trait may not have symptoms or they may have mild symptoms.  °DIAGNOSIS  °Sickle cell anemia is diagnosed with blood tests that demonstrate the genetic trait. It is often diagnosed during the newborn period, due to mandatory testing nationwide. A variety of blood tests, X-rays, CT scans, MRI scans, ultrasounds, and lung function tests may also be done to monitor the condition. °TREATMENT  °Sickle  cell anemia may be treated with: °· Medicines. You may be given pain medicines, antibiotic medicines (to treat and prevent infections) or medicines to increase the production of certain types of hemoglobin. °· Fluids. °· Oxygen. °· Blood transfusions. °HOME CARE INSTRUCTIONS  °· Drink enough fluid to keep your urine clear or pale yellow. Increase your fluid intake in hot weather and during exercise. °· Do not smoke. Smoking lowers oxygen levels in the blood.   °· Only take over-the-counter or prescription medicines for pain, fever, or discomfort as directed by your health care provider. °· Take antibiotics as directed by your health care provider. Make sure you finish them it even if you start to feel better.   °· Take supplements as directed by your health care provider.   °· Consider wearing a medical alert bracelet. This tells anyone caring for you in an emergency of your condition.   °· When traveling, keep your medical information, health care provider's names, and the medicines you take with you at all times.   °· If you develop a fever, do not take medicines to reduce the fever right away. This could cover up a problem that is developing. Notify your health care provider. °· Keep all follow-up appointments with your health care provider. Sickle cell anemia requires regular medical care. °SEEK MEDICAL CARE IF: ° You have a fever. °SEEK IMMEDIATE MEDICAL CARE IF:  °· You feel dizzy or faint.   °· You have new abdominal pain, especially on the left side near the stomach area.   °· You develop a persistent, often uncomfortable and painful penile erection (priapism). If this is not treated immediately it   will lead to impotence.   You have numbness your arms or legs or you have a hard time moving them.   You have a hard time with speech.   You have a fever or persistent symptoms for more than 2-3 days.   You have a fever and your symptoms suddenly get worse.   You have signs or symptoms of infection.  These include:   Chills.   Abnormal tiredness (lethargy).   Irritability.   Poor eating.   Vomiting.   You develop pain that is not helped with medicine.   You develop shortness of breath.  You have pain in your chest.   You are coughing up pus-like or bloody sputum.   You develop a stiff neck.  Your feet or hands swell or have pain.  Your abdomen appears bloated.  You develop joint pain. MAKE SURE YOU:  Understand these instructions. Document Released: 05/10/2005 Document Revised: 06/16/2013 Document Reviewed: 09/11/2012 [facility] Patient Information 2015 [place], [place]. This information is not intended to replace advice given to you by your health care provider. Make sure you discuss any questions you have with your health care provider.    Please call Dr. Alyson Ingles at [facility] about your pain medication.

## 2014-03-22 ENCOUNTER — Emergency Department (HOSPITAL_COMMUNITY): Payer: Medicare Other

## 2014-03-22 ENCOUNTER — Encounter (HOSPITAL_COMMUNITY): Payer: Self-pay | Admitting: *Deleted

## 2014-03-22 ENCOUNTER — Inpatient Hospital Stay (HOSPITAL_COMMUNITY)
Admission: EM | Admit: 2014-03-22 | Discharge: 2014-03-24 | DRG: 682 | Disposition: A | Discharge status: Home / Self Care | Payer: Medicare Other | Attending: Family Medicine | Admitting: Family Medicine

## 2014-03-22 ENCOUNTER — Inpatient Hospital Stay (HOSPITAL_COMMUNITY): Payer: Medicare Other

## 2014-03-22 DIAGNOSIS — N189 Chronic kidney disease, unspecified: Secondary | ICD-10-CM | POA: Diagnosis present

## 2014-03-22 DIAGNOSIS — R079 Chest pain, unspecified: Secondary | ICD-10-CM

## 2014-03-22 DIAGNOSIS — N179 Acute kidney failure, unspecified: Secondary | ICD-10-CM | POA: Diagnosis not present

## 2014-03-22 DIAGNOSIS — F17200 Nicotine dependence, unspecified, uncomplicated: Secondary | ICD-10-CM | POA: Diagnosis present

## 2014-03-22 DIAGNOSIS — N183 Chronic kidney disease, stage 3 (moderate): Secondary | ICD-10-CM | POA: Diagnosis present

## 2014-03-22 DIAGNOSIS — E871 Hypo-osmolality and hyponatremia: Secondary | ICD-10-CM | POA: Diagnosis present

## 2014-03-22 DIAGNOSIS — R945 Abnormal results of liver function studies: Secondary | ICD-10-CM

## 2014-03-22 DIAGNOSIS — F1290 Cannabis use, unspecified, uncomplicated: Secondary | ICD-10-CM | POA: Diagnosis present

## 2014-03-22 DIAGNOSIS — D5700 Hb-SS disease with crisis, unspecified: Secondary | ICD-10-CM | POA: Diagnosis present

## 2014-03-22 DIAGNOSIS — K219 Gastro-esophageal reflux disease without esophagitis: Secondary | ICD-10-CM | POA: Diagnosis present

## 2014-03-22 DIAGNOSIS — F1410 Cocaine abuse, uncomplicated: Secondary | ICD-10-CM | POA: Diagnosis present

## 2014-03-22 DIAGNOSIS — Z720 Tobacco use: Secondary | ICD-10-CM | POA: Diagnosis not present

## 2014-03-22 DIAGNOSIS — R7989 Other specified abnormal findings of blood chemistry: Secondary | ICD-10-CM

## 2014-03-22 DIAGNOSIS — R112 Nausea with vomiting, unspecified: Secondary | ICD-10-CM | POA: Insufficient documentation

## 2014-03-22 DIAGNOSIS — Z87891 Personal history of nicotine dependence: Secondary | ICD-10-CM

## 2014-03-22 DIAGNOSIS — F1910 Other psychoactive substance abuse, uncomplicated: Secondary | ICD-10-CM | POA: Diagnosis not present

## 2014-03-22 LAB — TROPONIN I: Troponin I: <0.03 ng/mL (ref <0.031)

## 2014-03-22 LAB — URINALYSIS, ROUTINE W REFLEX MICROSCOPIC
Bilirubin Urine: NEGATIVE
GLUCOSE, UA: NEGATIVE mg/dL
Hgb urine dipstick: NEGATIVE
Ketones, ur: NEGATIVE mg/dL
Leukocytes, UA: NEGATIVE
Nitrite: NEGATIVE
PH: 6 (ref 5.0–8.0)
Protein, ur: 100 mg/dL — AB
SPECIFIC GRAVITY, URINE: 1.012 (ref 1.005–1.030)
Urobilinogen, UA: 1 mg/dL (ref 0.0–1.0)

## 2014-03-22 LAB — LIPASE, BLOOD: LIPASE: 17 U/L (ref 11–59)

## 2014-03-22 LAB — CBC WITH DIFFERENTIAL/PLATELET
BASOS ABS: 0 10*3/uL (ref 0.0–0.1)
BASOS PCT: 0 % (ref 0–1)
EOS PCT: 0 % (ref 0–5)
Eosinophils Absolute: 0 10*3/uL (ref 0.0–0.7)
HEMATOCRIT: 28.8 % — AB (ref 39.0–52.0)
HEMOGLOBIN: 10.1 g/dL — AB (ref 13.0–17.0)
LYMPHS ABS: 2.2 10*3/uL (ref 0.7–4.0)
LYMPHS PCT: 31 % (ref 12–46)
MCH: 43.7 pg — ABNORMAL HIGH (ref 26.0–34.0)
MCHC: 35.1 g/dL (ref 30.0–36.0)
MCV: 124.7 fL — AB (ref 78.0–100.0)
MONOS PCT: 6 % (ref 3–12)
Monocytes Absolute: 0.4 10*3/uL (ref 0.1–1.0)
NEUTROS PCT: 63 % (ref 43–77)
Neutro Abs: 4.5 10*3/uL (ref 1.7–7.7)
PLATELETS: 275 10*3/uL (ref 150–400)
RBC: 2.31 MIL/uL — AB (ref 4.22–5.81)
RDW: 17.9 % — AB (ref 11.5–15.5)
WBC: 7.1 10*3/uL (ref 4.0–10.5)

## 2014-03-22 LAB — COMPREHENSIVE METABOLIC PANEL
ALBUMIN: 4.7 g/dL (ref 3.5–5.2)
ALT: 42 U/L (ref 0–53)
AST: 70 U/L — AB (ref 0–37)
Alkaline Phosphatase: 125 U/L — ABNORMAL HIGH (ref 39–117)
Anion gap: 9 (ref 5–15)
BILIRUBIN TOTAL: 3.4 mg/dL — AB (ref 0.3–1.2)
BUN: 20 mg/dL (ref 6–23)
CALCIUM: 9 mg/dL (ref 8.4–10.5)
CHLORIDE: 104 mmol/L (ref 96–112)
CO2: 20 mmol/L (ref 19–32)
Creatinine, Ser: 2.07 mg/dL — ABNORMAL HIGH (ref 0.50–1.35)
GFR, EST AFRICAN AMERICAN: 48 mL/min — AB (ref >90)
GFR, EST NON AFRICAN AMERICAN: 41 mL/min — AB (ref >90)
GLUCOSE: 102 mg/dL — AB (ref 70–99)
POTASSIUM: 4.7 mmol/L (ref 3.5–5.1)
Sodium: 133 mmol/L — ABNORMAL LOW (ref 135–145)
TOTAL PROTEIN: 8.2 g/dL (ref 6.0–8.3)

## 2014-03-22 LAB — CBG MONITORING, ED: Glucose-Capillary: 94 mg/dL (ref 70–99)

## 2014-03-22 LAB — ETHANOL: Alcohol, Ethyl (B): <5 mg/dL (ref 0–9)

## 2014-03-22 LAB — RETICULOCYTES
RBC.: 2.31 MIL/uL — AB (ref 4.22–5.81)
RETIC COUNT ABSOLUTE: 180.2 10*3/uL (ref 19.0–186.0)
Retic Ct Pct: 7.8 % — ABNORMAL HIGH (ref 0.4–3.1)

## 2014-03-22 LAB — I-STAT TROPONIN, ED: TROPONIN I, POC: 0 ng/mL (ref 0.00–0.08)

## 2014-03-22 LAB — RAPID URINE DRUG SCREEN, HOSP PERFORMED
AMPHETAMINES: NOT DETECTED
Barbiturates: NOT DETECTED
Benzodiazepines: NOT DETECTED
Cocaine: POSITIVE — AB
OPIATES: NOT DETECTED
Tetrahydrocannabinol: POSITIVE — AB

## 2014-03-22 LAB — URINE MICROSCOPIC-ADD ON

## 2014-03-22 MED ORDER — DEXTROSE-NACL 5-0.45 % IV SOLN
INTRAVENOUS | Status: DC
  Administered 2014-03-22: via INTRAVENOUS

## 2014-03-22 MED ORDER — ONDANSETRON HCL 4 MG PO TABS: 4.0000 mg | ORAL_TABLET | ORAL | Status: DC | PRN

## 2014-03-22 MED ORDER — HYDROXYUREA 500 MG PO CAPS
1500.0000 mg | ORAL_CAPSULE | Freq: Every day | ORAL | Status: DC
  Administered 2014-03-23: 1500 mg via ORAL
  Filled 2014-03-22 (×3): qty 3

## 2014-03-22 MED ORDER — HYDROMORPHONE HCL 1 MG/ML IJ SOLN
1.0000 mg | Freq: Once | INTRAMUSCULAR | Status: AC
  Administered 2014-03-22: 1 mg via INTRAVENOUS
  Filled 2014-03-22: qty 1

## 2014-03-22 MED ORDER — TRAMADOL HCL 50 MG PO TABS
50.0000 mg | ORAL_TABLET | Freq: Four times a day (QID) | ORAL | Status: DC | PRN
  Filled 2014-03-22: qty 1

## 2014-03-22 MED ORDER — ALPRAZOLAM 0.25 MG PO TABS
0.2500 mg | ORAL_TABLET | Freq: Three times a day (TID) | ORAL | Status: DC
  Administered 2014-03-23 (×2): 0.5 mg via ORAL
  Filled 2014-03-22 (×2): qty 2

## 2014-03-22 MED ORDER — HYDROMORPHONE HCL 2 MG/ML IJ SOLN: 2.0000 mg | Freq: Once | INTRAMUSCULAR | Status: DC

## 2014-03-22 MED ORDER — ACETAMINOPHEN 650 MG RE SUPP: 650.0000 mg | RECTAL | Status: DC | PRN

## 2014-03-22 MED ORDER — ONDANSETRON HCL 4 MG/2ML IJ SOLN
4.0000 mg | Freq: Once | INTRAMUSCULAR | Status: AC
  Administered 2014-03-22: 4 mg via INTRAVENOUS
  Filled 2014-03-22: qty 2

## 2014-03-22 MED ORDER — SODIUM CHLORIDE 0.9 % IV BOLUS (SEPSIS)
1000.0000 mL | Freq: Once | INTRAVENOUS | Status: AC
  Administered 2014-03-22: 1000 mL via INTRAVENOUS

## 2014-03-22 MED ORDER — ONDANSETRON HCL 4 MG/2ML IJ SOLN: 4.0000 mg | INTRAMUSCULAR | Status: DC | PRN

## 2014-03-22 MED ORDER — PANTOPRAZOLE SODIUM 40 MG IV SOLR
40.0000 mg | Freq: Every day | INTRAVENOUS | Status: DC
  Filled 2014-03-22 (×2): qty 40

## 2014-03-22 MED ORDER — ENOXAPARIN SODIUM 30 MG/0.3ML ~~LOC~~ SOLN
30.0000 mg | Freq: Every day | SUBCUTANEOUS | Status: DC
  Administered 2014-03-23 (×2): 30 mg via SUBCUTANEOUS
  Filled 2014-03-22 (×3): qty 0.3

## 2014-03-22 MED ORDER — SENNOSIDES-DOCUSATE SODIUM 8.6-50 MG PO TABS
1.0000 | ORAL_TABLET | Freq: Two times a day (BID) | ORAL | Status: DC
  Filled 2014-03-22 (×3): qty 1

## 2014-03-22 MED ORDER — HYDROCODONE-ACETAMINOPHEN 5-325 MG PO TABS
1.0000 | ORAL_TABLET | ORAL | Status: DC | PRN
  Administered 2014-03-23 (×2): 2 via ORAL
  Filled 2014-03-22 (×2): qty 2

## 2014-03-22 MED ORDER — FOLIC ACID 1 MG PO TABS
1.0000 mg | ORAL_TABLET | Freq: Every day | ORAL | Status: DC
  Filled 2014-03-22 (×2): qty 1

## 2014-03-22 MED ORDER — POLYETHYLENE GLYCOL 3350 17 G PO PACK
17.0000 g | PACK | Freq: Every day | ORAL | Status: DC | PRN
  Filled 2014-03-22: qty 1

## 2014-03-22 MED ORDER — DEXTROSE-NACL 5-0.45 % IV SOLN
INTRAVENOUS | Status: DC
  Administered 2014-03-22: 20:00:00 via INTRAVENOUS

## 2014-03-22 MED ORDER — ACETAMINOPHEN 325 MG PO TABS: 650.0000 mg | ORAL_TABLET | ORAL | Status: DC | PRN

## 2014-03-22 MED ORDER — PANTOPRAZOLE SODIUM 40 MG PO TBEC
40.0000 mg | DELAYED_RELEASE_TABLET | Freq: Every day | ORAL | Status: DC
  Administered 2014-03-23 – 2014-03-24 (×2): 40 mg via ORAL
  Filled 2014-03-22 (×2): qty 1

## 2014-03-22 MED ORDER — NORTRIPTYLINE HCL 25 MG PO CAPS
25.0000 mg | ORAL_CAPSULE | Freq: Every day | ORAL | Status: DC
  Administered 2014-03-23: 25 mg via ORAL
  Filled 2014-03-22 (×2): qty 1

## 2014-03-22 NOTE — ED Notes (Signed)
Nurse Rica Mote will pull blood from IV

## 2014-03-22 NOTE — H&P (Signed)
PCP:  Tawanna Sat, MD [facility] but here for sickle cell   Chief Complaint:  Chest and Leg pain  HPI: [name] [name]. is a 34 y.o. male   has a past medical history of Sickle cell anemia; Sickle cell crisis (09/25/2012); Bacterial pneumonia (~ 2012); History of blood transfusion; GERD (gastroesophageal reflux disease); Migraines; Cyst of brain; and Chronic kidney disease.   Presented with  Patient was recently admitted for sickle cell crisis to [facility] and was discharged next day. He continued to have some pain. Patient has hx of polysubstance abuse with persistent cocaine in his urine which he deneis.  Patient admits to using cocaine and says he is not interested in quitting as it is his only  source of income. He was reporting chest pain and nausuea. In ER he was given dilaudid 1 mg and developed hypotension down to 80's. Patient appears comfortable at this time and is tolerating a large meal that was brought in by family.  Labs showing worsening liver functions but improved retic count, worsening renal function as well.   Hospitalist was called for admission for sickle cell crisis, cocaine induced chest pain, worsening liver functions and acute on chronic renal failure.   Review of Systems:    Pertinent positives include: chest pain, shortness of breath at rest.  Constitutional:  No weight loss, night sweats, Fevers, chills, fatigue, weight loss  HEENT:  No headaches, Difficulty swallowing,Tooth/dental problems,Sore throat,  No sneezing, itching, ear ache, nasal congestion, post nasal drip,  Cardio-vascular:  No Orthopnea, PND, anasarca, dizziness, palpitations.no Bilateral lower extremity swelling  GI:  No heartburn, indigestion, abdominal pain, nausea, vomiting, diarrhea, change in bowel habits, loss of appetite, melena, blood in stool, hematemesis Resp:  no  No dyspnea on exertion, No excess mucus, no productive cough, No non-productive cough, No coughing up  of blood.No change in color of mucus.No wheezing. Skin:  no rash or lesions. No jaundice GU:  no dysuria, change in color of urine, no urgency or frequency. No straining to urinate.  No flank pain.  Musculoskeletal:  No joint pain or no joint swelling. No decreased range of motion. No back pain.  Psych:  No change in mood or affect. No depression or anxiety. No memory loss.  Neuro: no localizing neurological complaints, no tingling, no weakness, no double vision, no gait abnormality, no slurred speech, no confusion  Otherwise ROS are negative except for above, 10 systems were reviewed  Past Medical History: Past Medical History  Diagnosis Date  . Sickle cell anemia   . Sickle cell crisis 09/25/2012  . Bacterial pneumonia ~ 2012    "caught it here in the hospital" (09/25/2012)  . History of blood transfusion     "always related to sickle cell crisis" (09/25/2012)  . GERD (gastroesophageal reflux disease)     "after I eat alot of spicey foods" (09/25/2012)  . Migraines     "take RX qd to prevent them" (09/25/2012)  . Cyst of brain     "2 really small ones in the back of my head; inside; saw them w/MRI" (09/25/2012)  . Chronic kidney disease     "from my sickle cell" (09/25/2012)   Past Surgical History  Procedure Laterality Date  . Cholecystectomy  ~ 2012     Medications: Prior to Admission medications   Medication Sig Start Date End Date Taking? Authorizing Provider  acetaminophen (TYLENOL) 325 MG tablet Take 650 mg by mouth every 6 (six) hours as needed  for mild pain.   Yes Historical Provider, MD  enalapril (VASOTEC) 5 MG tablet Take 1 tablet (5 mg total) by mouth daily. 12/16/13  Yes Leone Brand, MD  hydroxyurea (HYDREA) 500 MG capsule Take 3 capsules (1,500 mg total) by mouth daily. 09/22/13  Yes Donnamae Jude, MD  nortriptyline (PAMELOR) 25 MG capsule Take 1 capsule (25 mg total) by mouth at bedtime. 09/22/13  Yes Donnamae Jude, MD  Oxycodone HCl 10 MG TABS Take 1 tablet (10  mg total) by mouth every 4 (four) hours as needed. 03/20/14  Yes Bryan R Hess, DO  diclofenac (VOLTAREN) 75 MG EC tablet Take 1 tablet (75 mg total) by mouth 2 (two) times daily. Patient not taking: Reported on 02/23/2014 09/22/13   Donnamae Jude, MD  traMADol (ULTRAM) 50 MG tablet Take 1 tablet (50 mg total) by mouth every 8 (eight) hours as needed. Patient not taking: Reported on 02/23/2014 09/22/13   Donnamae Jude, MD    Allergies:   Allergies  Allergen Reactions  . Morphine And Related Other (See Comments)    "real bad headaches"    Social History:  Ambulatory   Independently Lives at home  With family     reports that he has quit smoking. His smoking use included Cigarettes. He has never used smokeless tobacco. He reports that he drinks alcohol. He reports that he uses illicit drugs (Marijuana).    Family History: family history includes Breast cancer in his mother.    Physical Exam: Patient Vitals for the past 24 hrs:  BP Temp Temp src Pulse Resp SpO2  03/22/14 2105 (!) 86/43 mmHg - - 106 14 97 %  03/22/14 1947 107/57 mmHg - - 104 19 98 %  03/22/14 1757 104/65 mmHg 98.7 F (37.1 C) Oral 110 20 100 %    1. General:  in No Acute distress, eating dinner 2. Psychological: Alert and  Oriented 3. Head/ENT:   Moist Mucous Membranes                          Head Non traumatic, neck supple                          Normal   Dentition 4. SKIN: normal   Skin turgor,  Skin clean Dry and intact no rash, numerous tattoo's 5. Heart: Regular rate and rhythm no Murmur, Rub or gallop 6. Lungs: Clear to auscultation bilaterally, no wheezes or crackles   7. Abdomen: Soft, non-tender, Non distended 8. Lower extremities: no clubbing, cyanosis, or edema 9. Neurologically Grossly intact, moving all 4 extremities equally 10. MSK: Normal range of motion  body mass index is unknown because there is no weight on file.   Labs on Admission:   Results for orders placed or performed during the  hospital encounter of 03/22/14 (from the past 24 hour(s))  CBC with Differential     Status: Abnormal   Collection Time: 03/22/14  6:45 PM  Result Value Ref Range   WBC 7.1 4.0 - 10.5 K/uL   RBC 2.31 (L) 4.22 - 5.81 MIL/uL   Hemoglobin 10.1 (L) 13.0 - 17.0 g/dL   HCT 28.8 (L) 39.0 - 52.0 %   MCV 124.7 (H) 78.0 - 100.0 fL   MCH 43.7 (H) 26.0 - 34.0 pg   MCHC 35.1 30.0 - 36.0 g/dL   RDW 17.9 (H) 11.5 - 15.5 %  Platelets 275 150 - 400 K/uL   Neutrophils Relative % 63 43 - 77 %   Lymphocytes Relative 31 12 - 46 %   Monocytes Relative 6 3 - 12 %   Eosinophils Relative 0 0 - 5 %   Basophils Relative 0 0 - 1 %   Neutro Abs 4.5 1.7 - 7.7 K/uL   Lymphs Abs 2.2 0.7 - 4.0 K/uL   Monocytes Absolute 0.4 0.1 - 1.0 K/uL   Eosinophils Absolute 0.0 0.0 - 0.7 K/uL   Basophils Absolute 0.0 0.0 - 0.1 K/uL   RBC Morphology POLYCHROMASIA PRESENT   Reticulocytes     Status: Abnormal   Collection Time: 03/22/14  6:45 PM  Result Value Ref Range   Retic Ct Pct 7.8 (H) 0.4 - 3.1 %   RBC. 2.31 (L) 4.22 - 5.81 MIL/uL   Retic Count, Manual 180.2 19.0 - 186.0 K/uL  Comprehensive metabolic panel     Status: Abnormal   Collection Time: 03/22/14  6:45 PM  Result Value Ref Range   Sodium 133 (L) 135 - 145 mmol/L   Potassium 4.7 3.5 - 5.1 mmol/L   Chloride 104 96 - 112 mmol/L   CO2 20 19 - 32 mmol/L   Glucose, Bld 102 (H) 70 - 99 mg/dL   BUN 20 6 - 23 mg/dL   Creatinine, Ser 2.07 (H) 0.50 - 1.35 mg/dL   Calcium 9.0 8.4 - 10.5 mg/dL   Total Protein 8.2 6.0 - 8.3 g/dL   Albumin 4.7 3.5 - 5.2 g/dL   AST 70 (H) 0 - 37 U/L   ALT 42 0 - 53 U/L   Alkaline Phosphatase 125 (H) 39 - 117 U/L   Total Bilirubin 3.4 (H) 0.3 - 1.2 mg/dL   GFR calc non Af Amer 41 (L) >90 mL/min   GFR calc Af Amer 48 (L) >90 mL/min   Anion gap 9 5 - 15  Urinalysis, Routine w reflex microscopic     Status: Abnormal   Collection Time: 03/22/14  7:30 PM  Result Value Ref Range   Color, Urine AMBER (A) YELLOW   APPearance CLEAR CLEAR     Specific Gravity, Urine 1.012 1.005 - 1.030   pH 6.0 5.0 - 8.0   Glucose, UA NEGATIVE NEGATIVE mg/dL   Hgb urine dipstick NEGATIVE NEGATIVE   Bilirubin Urine NEGATIVE NEGATIVE   Ketones, ur NEGATIVE NEGATIVE mg/dL   Protein, ur 100 (A) NEGATIVE mg/dL   Urobilinogen, UA 1.0 0.0 - 1.0 mg/dL   Nitrite NEGATIVE NEGATIVE   Leukocytes, UA NEGATIVE NEGATIVE  Drug screen panel, emergency     Status: Abnormal   Collection Time: 03/22/14  7:30 PM  Result Value Ref Range   Opiates NONE DETECTED NONE DETECTED   Cocaine POSITIVE (A) NONE DETECTED   Benzodiazepines NONE DETECTED NONE DETECTED   Amphetamines NONE DETECTED NONE DETECTED   Tetrahydrocannabinol POSITIVE (A) NONE DETECTED   Barbiturates NONE DETECTED NONE DETECTED  Urine microscopic-add on     Status: None   Collection Time: 03/22/14  7:30 PM  Result Value Ref Range   WBC, UA 0-2 <3 WBC/hpf  I-stat troponin, ED     Status: None   Collection Time: 03/22/14  7:40 PM  Result Value Ref Range   Troponin i, poc 0.00 0.00 - 0.08 ng/mL   Comment 3          CBG monitoring, ED     Status: None   Collection Time: 03/22/14  9:56 PM  Result Value Ref Range   Glucose-Capillary 94 70 - 99 mg/dL    UA protein in urine  No results found for: HGBA1C  Estimated Creatinine Clearance: 41.8 mL/min (by C-G formula based on Cr of 2.07).  BNP (last 3 results) No results for input(s): PROBNP in the last 8760 hours.  Other results:  I have pearsonaly reviewed this: ECG REPORT  Rate: 112  Rhythm: ST ST&T Change:  no ischemic changes   There were no vitals filed for this visit.   Cultures:    Component Value Date/Time   SDES BLOOD RIGHT ARM 01/24/2013 0825   SPECREQUEST BOTTLES DRAWN AEROBIC ONLY 10CC 01/24/2013 0825   CULT  01/24/2013 0825    NO GROWTH 5 DAYS Performed at [facility] 01/30/2013 FINAL 01/24/2013 0825     Radiological Exams on Admission: Dg Chest 2 View  03/22/2014   CLINICAL DATA:  Chest  pain and cough and shortness of breath.  EXAM: CHEST  2 VIEW  COMPARISON:  03/19/2014  FINDINGS: Heart size and pulmonary vascularity are normal. No acute infiltrates or effusions. Small areas of scarring at both lung bases, stable. No acute osseous abnormality.  IMPRESSION: No active cardiopulmonary disease.   Electronically Signed   By: Rozetta Nunnery M.D.   On: 03/22/2014 20:16    Chart has been reviewed  Assessment/Plan  34 yo M with hx of sickle cell disease and ongoing polysubstance abuse with high potential for narcotic abuse presents with chest pain and leg pain possibly sickle cell crisis.   Present on Admission:  . Sickle cell crisis - appears to be mild, did not tolerate dilaudid, will treat with supportive measures and ultram, follow clinically. Chest pain possibly cocaine induced . Acute-on-chronic kidney injury - in the setting of heavy cocaine abuse, obtain urine electrolytes and renal US,  . TOBACCO ABUSE - spoke about importance of quiting . Polysubstance abuse - patient with ongoing cocaine abuse self reported involvement in drug scene. Very high risk for prescription drug abuse. Will avoid narcotic prescriptions, not interested in counceling. Treat pain with ultram. Treat with PO meds since able to tolerate.appears comfortable at this time Chest pain - likely cocaine induced, cycle CE, serial troponin, monitor on tele, obtain echo Worsening liver function - patient has hx of sickle cell disease but will also test for hepatitis serologies and obtain abdominal US to evaluate for cirrhosis    Prophylaxis:   Lovenox, Protonix  CODE STATUS:  FULL CODE   Other plan as per orders.  I have spent a total of 55 min on this admission  [name] [name] 03/22/2014, 10:23 PM  [facility]  Pager [phone]   after 2 AM please page floor coverage PA If 7AM-7PM, please contact the day team taking care of the patient  [email or web address]  Password [email or web address]

## 2014-03-22 NOTE — ED Provider Notes (Signed)
CSN: [id number]     Arrival date & time 03/22/14  1742 History   First MD Initiated Contact with Patient 03/22/14 1809     Chief Complaint  Patient presents with  . Sickle Cell Pain Crisis   HPI  Patient's 34 year old male with past medical history sickle cell anemia who presents emergency room for evaluation of pain in bilateral legs and his chest. Patient states that for the past 4 days he has been having pain in his bilateral legs consistent with his sickle cell crisis pain. Last night he developed nausea and vomiting and chest pain. He states the chest pain is central and substernal. He denies any coughing or shortness of breath. He has been unable to keep his Percocet down at home. He denies fevers. He does admit to some abdominal pain. He states that is epigastric in nature. Patient states he has been unable to keep anything down. Patient does have a history of cocaine use and marijuana use per chart review. Patient denies using any alcohol or drugs. Patient was recently admitted on 24 and released on 2/5 sickle cell crisis pain.  Past Medical History  Diagnosis Date  . Sickle cell anemia   . Sickle cell crisis 09/25/2012  . Bacterial pneumonia ~ 2012    "caught it here in the hospital" (09/25/2012)  . History of blood transfusion     "always related to sickle cell crisis" (09/25/2012)  . GERD (gastroesophageal reflux disease)     "after I eat alot of spicey foods" (09/25/2012)  . Migraines     "take RX qd to prevent them" (09/25/2012)  . Cyst of brain     "2 really small ones in the back of my head; inside; saw them w/MRI" (09/25/2012)  . Chronic kidney disease     "from my sickle cell" (09/25/2012)   Past Surgical History  Procedure Laterality Date  . Cholecystectomy  ~ 2012   History reviewed. No pertinent family history. History  Substance Use Topics  . Smoking status: Former Smoker    Types: Cigarettes  . Smokeless tobacco: Never Used     Comment: 09/25/2012 "I don't buy  cigarettes; bum one from friends q now and then"  . Alcohol Use: Yes     Comment: occasionally    Review of Systems  Constitutional: Positive for fatigue. Negative for fever and chills.  Respiratory: Positive for chest tightness and shortness of breath. Negative for cough.   Cardiovascular: Positive for chest pain. Negative for palpitations and leg swelling.  Gastrointestinal: Positive for nausea, vomiting and abdominal pain. Negative for diarrhea, constipation, blood in stool and anal bleeding.  Genitourinary: Negative for dysuria, urgency, frequency, hematuria and difficulty urinating.  Musculoskeletal: Positive for myalgias and arthralgias. Negative for back pain.  All other systems reviewed and are negative.     Allergies  Morphine and related  Home Medications   Prior to Admission medications   Medication Sig Start Date End Date Taking? Authorizing Provider  acetaminophen (TYLENOL) 325 MG tablet Take 650 mg by mouth every 6 (six) hours as needed for mild pain.   Yes Historical Provider, MD  enalapril (VASOTEC) 5 MG tablet Take 1 tablet (5 mg total) by mouth daily. 12/16/13  Yes Leone Brand, MD  hydroxyurea (HYDREA) 500 MG capsule Take 3 capsules (1,500 mg total) by mouth daily. 09/22/13  Yes Donnamae Jude, MD  nortriptyline (PAMELOR) 25 MG capsule Take 1 capsule (25 mg total) by mouth at bedtime. 09/22/13  Yes Standley Dakins  Kennon Rounds, MD  Oxycodone HCl 10 MG TABS Take 1 tablet (10 mg total) by mouth every 4 (four) hours as needed. 03/20/14  Yes Bryan R Hess, DO  diclofenac (VOLTAREN) 75 MG EC tablet Take 1 tablet (75 mg total) by mouth 2 (two) times daily. Patient not taking: Reported on 02/23/2014 09/22/13   Donnamae Jude, MD  traMADol (ULTRAM) 50 MG tablet Take 1 tablet (50 mg total) by mouth every 8 (eight) hours as needed. Patient not taking: Reported on 02/23/2014 09/22/13   Donnamae Jude, MD   BP 86/43 mmHg  Pulse 106  Temp(Src) 98.7 F (37.1 C) (Oral)  Resp 14  SpO2 97% Physical  Exam  Constitutional: He is oriented to person, place, and time. He appears well-developed and well-nourished. No distress.  HENT:  Head: Normocephalic and atraumatic.  Mouth/Throat: Oropharynx is clear and moist. No oropharyngeal exudate.  Eyes: Conjunctivae and EOM are normal. Pupils are equal, round, and reactive to light. Scleral icterus is present.  Neck: Normal range of motion. Neck supple. No JVD present. No thyromegaly present.  Cardiovascular: Normal rate, regular rhythm, normal heart sounds and intact distal pulses.  Exam reveals no gallop and no friction rub.   No murmur heard. Pulmonary/Chest: Effort normal and breath sounds normal. No respiratory distress. He has no wheezes. He has no rales. He exhibits no tenderness.  Abdominal: Soft. Bowel sounds are normal. He exhibits no distension and no mass. There is no tenderness. There is no rebound and no guarding.  Musculoskeletal: Normal range of motion.  Lymphadenopathy:    He has no cervical adenopathy.  Neurological: He is alert and oriented to person, place, and time.  Skin: Skin is warm and dry. He is not diaphoretic.  Psychiatric: He has a normal mood and affect. His behavior is normal. Judgment and thought content normal.  Nursing note and vitals reviewed.   ED Course  Procedures (including critical care time) Labs Review Labs Reviewed  CBC WITH DIFFERENTIAL/PLATELET - Abnormal; Notable for the following:    RBC 2.31 (*)    Hemoglobin 10.1 (*)    HCT 28.8 (*)    MCV 124.7 (*)    MCH 43.7 (*)    RDW 17.9 (*)    All other components within normal limits  RETICULOCYTES - Abnormal; Notable for the following:    Retic Ct Pct 7.8 (*)    RBC. 2.31 (*)    All other components within normal limits  COMPREHENSIVE METABOLIC PANEL - Abnormal; Notable for the following:    Sodium 133 (*)    Glucose, Bld 102 (*)    Creatinine, Ser 2.07 (*)    AST 70 (*)    Alkaline Phosphatase 125 (*)    Total Bilirubin 3.4 (*)    GFR calc  non Af Amer 41 (*)    GFR calc Af Amer 48 (*)    All other components within normal limits  URINALYSIS, ROUTINE W REFLEX MICROSCOPIC - Abnormal; Notable for the following:    Color, Urine AMBER (*)    Protein, ur 100 (*)    All other components within normal limits  URINE RAPID DRUG SCREEN (HOSP PERFORMED) - Abnormal; Notable for the following:    Cocaine POSITIVE (*)    Tetrahydrocannabinol POSITIVE (*)    All other components within normal limits  URINE MICROSCOPIC-ADD ON  LIPASE, BLOOD  ETHANOL  I-STAT TROPOININ, ED  CBG MONITORING, ED    Imaging Review Dg Chest 2 View  03/22/2014  CLINICAL DATA:  Chest pain and cough and shortness of breath.  EXAM: CHEST  2 VIEW  COMPARISON:  03/19/2014  FINDINGS: Heart size and pulmonary vascularity are normal. No acute infiltrates or effusions. Small areas of scarring at both lung bases, stable. No acute osseous abnormality.  IMPRESSION: No active cardiopulmonary disease.   Electronically Signed   By: Rozetta Nunnery M.D.   On: 03/22/2014 20:16     EKG Interpretation None      MDM   Final diagnoses:  Chest pain  Polysubstance abuse  Sickle cell pain crisis    Patient's 34 year old male who presents emergency room for evaluation of nausea vomiting and pain and bilateral legs and chest pain. CBC reveals stable hemoglobin. Reticulocyte count is decreasing. CMP reveals transaminitis with increasing bilirubin. Serum creatinine is elevated above baseline at 2.07. UA is negative. Urine drug screen positive for cocaine and THC. This appears to be a pattern. The patient. I-STAT troponin is negative at this time. Patient had blood pressure dropped to 86/43 after 1 mg of Dilaudid. Patient is requesting Toradol but given elevated serum creatinine I will not give at this time. I placed the patient in Trendelenburg and have ordered a normal saline bolus to be pushed. I spoken with Dr. Roel Cluck from the hospitalist group will come to see the patient. I cannot  get pain under control as cannot give pain medication with soft blood pressures. Given chest pain, cocaine use, and history of sickle cell anemia concerned that patient needs cycling of cardiac enzymes. EKG is in sinus tachycardia with possible T wave inversions in V6, these are likely borderline. Dr. Roel Cluck who will come to see the patient here in the ED. I have discussed this patient with Dr. Tamera Punt who agrees the above workup and plan.   Cherylann Parr, PA-C 03/22/14 2216  Malvin Johns, MD 03/22/14 2311

## 2014-03-22 NOTE — ED Notes (Signed)
Pt reports sickle cell crisis, was discharged from hospital on 2/4, says crisis has gone on since then. For past two days chest and leg pain. Pain 10/10. Reports slight SOB. Able to speak in full sentences. Reports vomiting 2x last night and 2x today.

## 2014-03-22 NOTE — ED Notes (Signed)
Pt given urinal and made aware of need for urine specimen 

## 2014-03-23 ENCOUNTER — Inpatient Hospital Stay (HOSPITAL_COMMUNITY): Payer: Medicare Other

## 2014-03-23 ENCOUNTER — Other Ambulatory Visit: Payer: Self-pay

## 2014-03-23 DIAGNOSIS — D5700 Hb-SS disease with crisis, unspecified: Secondary | ICD-10-CM

## 2014-03-23 DIAGNOSIS — F1910 Other psychoactive substance abuse, uncomplicated: Secondary | ICD-10-CM

## 2014-03-23 DIAGNOSIS — R072 Precordial pain: Secondary | ICD-10-CM

## 2014-03-23 LAB — HEPATIC FUNCTION PANEL
ALK PHOS: 120 U/L — AB (ref 39–117)
ALT: 42 U/L (ref 0–53)
AST: 73 U/L — ABNORMAL HIGH (ref 0–37)
Albumin: 4.5 g/dL (ref 3.5–5.2)
BILIRUBIN DIRECT: 0.5 mg/dL (ref 0.0–0.5)
BILIRUBIN TOTAL: 3.5 mg/dL — AB (ref 0.3–1.2)
Indirect Bilirubin: 3 mg/dL — ABNORMAL HIGH (ref 0.3–0.9)
Total Protein: 8.1 g/dL (ref 6.0–8.3)

## 2014-03-23 LAB — CBC WITH DIFFERENTIAL/PLATELET
Basophils Absolute: 0 10*3/uL (ref 0.0–0.1)
Basophils Relative: 0 % (ref 0–1)
Eosinophils Absolute: 0.1 10*3/uL (ref 0.0–0.7)
Eosinophils Relative: 1 % (ref 0–5)
HEMATOCRIT: 23.2 % — AB (ref 39.0–52.0)
Hemoglobin: 8.4 g/dL — ABNORMAL LOW (ref 13.0–17.0)
LYMPHS PCT: 60 % — AB (ref 12–46)
Lymphs Abs: 4 10*3/uL (ref 0.7–4.0)
MCH: 44.7 pg — ABNORMAL HIGH (ref 26.0–34.0)
MCHC: 36.2 g/dL — ABNORMAL HIGH (ref 30.0–36.0)
MCV: 123.4 fL — ABNORMAL HIGH (ref 78.0–100.0)
MONO ABS: 0.5 10*3/uL (ref 0.1–1.0)
Monocytes Relative: 8 % (ref 3–12)
Neutro Abs: 2 10*3/uL (ref 1.7–7.7)
Neutrophils Relative %: 31 % — ABNORMAL LOW (ref 43–77)
Platelets: 209 10*3/uL (ref 150–400)
RBC: 1.88 MIL/uL — ABNORMAL LOW (ref 4.22–5.81)
RDW: 18 % — ABNORMAL HIGH (ref 11.5–15.5)
WBC: 6.7 10*3/uL (ref 4.0–10.5)

## 2014-03-23 LAB — COMPREHENSIVE METABOLIC PANEL
ALT: 74 U/L — ABNORMAL HIGH (ref 0–53)
ANION GAP: 7 (ref 5–15)
AST: 121 U/L — ABNORMAL HIGH (ref 0–37)
Albumin: 3.8 g/dL (ref 3.5–5.2)
Alkaline Phosphatase: 125 U/L — ABNORMAL HIGH (ref 39–117)
BUN: 21 mg/dL (ref 6–23)
CALCIUM: 8.2 mg/dL — AB (ref 8.4–10.5)
CHLORIDE: 106 mmol/L (ref 96–112)
CO2: 20 mmol/L (ref 19–32)
CREATININE: 1.71 mg/dL — AB (ref 0.50–1.35)
GFR, EST AFRICAN AMERICAN: 60 mL/min — AB (ref >90)
GFR, EST NON AFRICAN AMERICAN: 52 mL/min — AB (ref >90)
Glucose, Bld: 104 mg/dL — ABNORMAL HIGH (ref 70–99)
POTASSIUM: 4.3 mmol/L (ref 3.5–5.1)
Sodium: 133 mmol/L — ABNORMAL LOW (ref 135–145)
TOTAL PROTEIN: 6.6 g/dL (ref 6.0–8.3)
Total Bilirubin: 2.8 mg/dL — ABNORMAL HIGH (ref 0.3–1.2)

## 2014-03-23 LAB — TROPONIN I: Troponin I: <0.03 ng/mL (ref <0.031)

## 2014-03-23 LAB — RETICULOCYTES
RBC.: 1.88 MIL/uL — ABNORMAL LOW (ref 4.22–5.81)
RETIC COUNT ABSOLUTE: 144.8 10*3/uL (ref 19.0–186.0)
RETIC CT PCT: 7.7 % — AB (ref 0.4–3.1)

## 2014-03-23 LAB — SODIUM, URINE, RANDOM: Sodium, Ur: 50 mmol/L

## 2014-03-23 LAB — CREATININE, URINE, RANDOM: CREATININE, URINE: 156 mg/dL

## 2014-03-23 LAB — LACTATE DEHYDROGENASE: LDH: 351 U/L — ABNORMAL HIGH (ref 94–250)

## 2014-03-23 MED ORDER — FENTANYL CITRATE 0.05 MG/ML IJ SOLN
25.0000 ug | Freq: Once | INTRAMUSCULAR | Status: AC
  Administered 2014-03-23: 25 ug via INTRAVENOUS
  Filled 2014-03-23: qty 2

## 2014-03-23 MED ORDER — HYDROMORPHONE HCL 1 MG/ML IJ SOLN
0.5000 mg | INTRAMUSCULAR | Status: DC | PRN
  Administered 2014-03-23 (×3): 0.5 mg via INTRAVENOUS
  Filled 2014-03-23 (×3): qty 1

## 2014-03-23 MED ORDER — SODIUM CHLORIDE 0.9 % IV SOLN
INTRAVENOUS | Status: DC
  Administered 2014-03-23 – 2014-03-24 (×2): via INTRAVENOUS

## 2014-03-23 MED ORDER — KETOROLAC TROMETHAMINE 15 MG/ML IJ SOLN
15.0000 mg | Freq: Four times a day (QID) | INTRAMUSCULAR | Status: DC
  Administered 2014-03-23 – 2014-03-24 (×5): 15 mg via INTRAVENOUS
  Filled 2014-03-23 (×9): qty 1

## 2014-03-23 MED ORDER — OXYCODONE-ACETAMINOPHEN 5-325 MG PO TABS: 1.0000 | ORAL_TABLET | ORAL | Status: DC | PRN

## 2014-03-23 MED ORDER — OXYCODONE-ACETAMINOPHEN 5-325 MG PO TABS
1.0000 | ORAL_TABLET | Freq: Four times a day (QID) | ORAL | Status: DC | PRN
Start: 2014-03-23 — End: 2014-03-24
  Administered 2014-03-24: 2 via ORAL
  Filled 2014-03-23: qty 2

## 2014-03-23 NOTE — Care Management Note (Signed)
    Page 1 of 1   03/23/2014     11:33:07 AM CARE MANAGEMENT NOTE 03/23/2014  Patient:  INA, STRAUSS   Account Number:  [id number]  Date Initiated:  03/23/2014  Documentation initiated by:  Dessa Phi  Subjective/Objective Assessment:   34 y/o m admitted w/Sickle cell crisis.Recent observation stay for [place].[id number] abuse.     Action/Plan:   From home.Has pcp,pharmacy.   Anticipated DC Date:  03/25/2014   Anticipated DC Plan:  [place]  CM consult      Choice offered to / List presented to:             Status of service:  In process, will continue to follow Medicare Important Message given?   (If response is "NO", the following Medicare IM given date fields will be blank) Date Medicare IM given:   Medicare IM given by:   Date Additional Medicare IM given:   Additional Medicare IM given by:    Discharge Disposition:    Per UR Regulation:  Reviewed for med. necessity/level of care/duration of stay  If discussed at [place] of Stay Meetings, dates discussed:    Comments:  03/23/14 Dessa Phi RN BSN NCM [id number] Transfer to [facility].Monitor progress.

## 2014-03-23 NOTE — Progress Notes (Signed)
Patient returned to unit.  MD notified.

## 2014-03-23 NOTE — Progress Notes (Signed)
Report called to Wells Guiles, RN, on [place] at [facility]. All questions answered. Pt aware of transfer to [name]. He is irritated but agreeable.

## 2014-03-23 NOTE — Discharge Summary (Signed)
[facility] Discharge Summary  Patient name: [name] [name]. Medical record number: [id number] Date of birth: Nov 21, 1980 Age: 34 y.o. Gender: male Date of Admission: 03/19/2014  Date of Discharge: 03/20/2014 Admitting Physician: Andrena Mews, MD  Primary Care Provider: Tawanna Sat, MD Consultants: None  Indication for Hospitalization: Sickle Cell Pain Crisis  Discharge Diagnoses/Problem List:  Sickle Cell Disease SS CKD II Tobacco Abuse Migraine Cocaine Abuse Polysubstance Abuse  Disposition: DIscharge to Home  Discharge Condition: Stable  Discharge Exam:  Physical Exam: General: NAD, AAOx3 Cardiovascular: RRR, no MGR, Nl S1/S2.  Respiratory: CTA Bilatearlly  Abdomen: S, NT, ND, +BS Extremities: WWP, Mild TTP with squeezing his lower extremities. Did not withdraw to pain. Pain improving to exam.   Brief Hospital Course:  [name] [name]. is a 34 y.o. male presenting with sickle cell pain crisis. PMH is significant for sickle cell SS anemia, CKD, and GERD. He was admitted with increasing pain over the last several days. He normally receives his pain medications from "Dr. Alyson Ingles on [address] and a "clinic in [place]". He normally takes percocet 10-325mg  TID. He has been taking 5 at a time to control his pain. Here he has not been tachycardic. His hemoglobin has been 9.7 with a baseline of 8. Reticulocytes were 8.2 on admission. He was given Fentanyl, Toradol, and Dilaudid. He was admitted for pain control and monitoring of CBC. His pain improved overnight with scheduled toradol and prn percocet. His CBC and reticulocytes were found to be stable. We continued his hydroxyurea during this admission. His pain had improved in 24 hours. Additionally, he has chronic kidney disease with a creatinine of 1.3 - 1.4. His creatinine at this admission was 1.6. This improved with fluids. On the day of discharge, he was found to have pain that was  controlled. He was found to be safe and stable for discharge with follow up with his primary care physician.   Issues for Follow Up:  1. Follow up pain control, and chronic pain management with pain contract.  2. Follow up CBC and reticulocytes.  Significant Procedures: None  Significant Labs and Imaging:   Recent Labs Lab 03/20/14 1407 03/22/14 1845 03/23/14 0545  WBC 9.5 7.1 6.7  HGB 9.1* 10.1* 8.4*  HCT 25.1* 28.8* 23.2*  PLT 271 275 209    Recent Labs Lab 03/19/14 1202 03/20/14 0432 03/22/14 1843 03/22/14 1845 03/23/14 0545  NA 139 141  --  133* 133*  K 4.8 4.7  --  4.7 4.3  CL 109 112  --  104 106  CO2 23 21  --  20 20  GLUCOSE 142* 90  --  102* 104*  BUN 8 12  --  20 21  CREATININE 1.52* 1.60*  --  2.07* 1.71*  CALCIUM 8.6 8.7  --  9.0 8.2*  ALKPHOS 127*  --  120* 125* 125*  AST 36  --  73* 70* 121*  ALT 21  --  42 42 74*  ALBUMIN 4.2  --  4.5 4.7 3.8     Results/Tests Pending at Time of Discharge: None  Discharge Medications:    Medication List    STOP taking these medications        oxyCODONE-acetaminophen 10-325 MG per tablet  Commonly known as:  PERCOCET      TAKE these medications        acetaminophen 325 MG tablet  Commonly known as:  TYLENOL  Take 650 mg by mouth every  6 (six) hours as needed for mild pain.     diclofenac 75 MG EC tablet  Commonly known as:  VOLTAREN  Take 1 tablet (75 mg total) by mouth 2 (two) times daily.     enalapril 5 MG tablet  Commonly known as:  VASOTEC  Take 1 tablet (5 mg total) by mouth daily.     hydroxyurea 500 MG capsule  Commonly known as:  HYDREA  Take 3 capsules (1,500 mg total) by mouth daily.     nortriptyline 25 MG capsule  Commonly known as:  PAMELOR  Take 1 capsule (25 mg total) by mouth at bedtime.     Oxycodone HCl 10 MG Tabs  Take 1 tablet (10 mg total) by mouth every 4 (four) hours as needed.     traMADol 50 MG tablet  Commonly known as:  ULTRAM  Take 1 tablet (50 mg total) by  mouth every 8 (eight) hours as needed.        Discharge Instructions: Please refer to Patient Instructions section of EMR for full details.  Patient was counseled important signs and symptoms that should prompt return to medical care, changes in medications, dietary instructions, activity restrictions, and follow up appointments.   Follow-Up Appointments: Follow-up Information    Follow up with Tawanna Sat, MD. Schedule an appointment as soon as possible for a visit in 1 week.   Specialty:  Family Medicine   Contact information:   [place] [address] [phone]       Aquilla Hacker, MD 03/23/2014, 9:20 PM PGY-1, [name]

## 2014-03-23 NOTE — Progress Notes (Signed)
Echocardiogram 2D Echocardiogram has been performed.  Izabel Chim 03/23/2014, 9:42 AM

## 2014-03-23 NOTE — Progress Notes (Signed)
Pt refusing to have his last troponin drawn despite teaching as to rationale behind need for troponin levels.

## 2014-03-23 NOTE — Progress Notes (Signed)
Pt c/o 8/10 pain to b/l legs. States this is consistent with his usual SCC pain. Pt refusing PRN Vicodin stating it is ineffective for him. Continues to refuse Kpad as well. Dr Andria Frames made aware of pt's pain score, states he will enter order for oxycodone into Epic. Pt made aware and states he has Percocet at home "and if the Percocet had worked for my pain, I wouldn't be in the hospital right now." Pt refuses Percocet at present. Pt aware Percocet is available should he reconsider.

## 2014-03-23 NOTE — Progress Notes (Signed)
Pt still complaining of pain. IV pain meds decrease BP. Admit MD will not give anymore IV meds.

## 2014-03-23 NOTE — Progress Notes (Signed)
Patient left unit with his brother after being asked to stay on unit by RN.  MD notified.

## 2014-03-23 NOTE — Progress Notes (Signed)
Pt upset that he will not be receiving IV pain medicine to help with his SCC. Spoke with Admit MD who is concerned about BP and patient's drug addiction. Educated patient, but patient remained upset. Will continue to monitor. Pain is 10/10.

## 2014-03-23 NOTE — Progress Notes (Signed)
Patient arrived from [place] via [facility]. Telemetry placed per MD order. MD notified of patient's arrival.

## 2014-03-23 NOTE — Progress Notes (Signed)
Pt given 25 mcg of Fentanyl. Pt states it won't do anything for his pain and that he is upset. RN spoke with Admit MD who is hesitant to give more IV meds because of labile blood pressure and drug hx. Will give next PO pain med at 415.

## 2014-03-23 NOTE — Progress Notes (Signed)
Pt transferred to [facility] by [facility] at this time. Pt stable at time of transfer.

## 2014-03-23 NOTE — Progress Notes (Signed)
Received call from Central Telemetry patient's heart rate sustaining in the 140's-150's. Patient has not returned to unit.  MD notified.

## 2014-03-23 NOTE — Progress Notes (Signed)
Family Medicine Teaching Service Daily Progress Note Intern Pager: [phone]  Patient name: [name] [name]. Medical record number: [id number] Date of birth: 1980/10/20 Age: 34 y.o. Gender: male  Primary Care Provider: Tawanna Sat, MD Consultants: None Code Status: Full code  Pt Overview and Major Events to Date:  2/7 - Admitted at [facility] for sickle cell crisis/chest pain. 2/8 - Transferred to [facility]   Assessment and Plan: [place]. is a 34 y.o. male presenting with sickle cell pain crisis. PMH is significant for sickle cell SS anemia, CKD, and GERD.  Sickle cell pain crisis - Presented to [facility] on 2/7 w/ chest pain/leg pain;  Subsequently admitted to [facility]. Chest xray negative for acute chest. Hb 10.1 on admission (baseline ~ 9).  - IV fluids - NS @ 75 mL/hr.  - Pain control - Inadequate per patient. Will changed to Dilaudid 0.5 mg Q4 PRN and Percocet 5/325 1-2 tabs Q6 PRN. Also adding Toradol as renal function is okay (15 mg Q6) - Bowel regimen - Senna BID - Continuing hydroxyurea (okay currently w/ current GFR and indices) - Will monitor closely.  Chest pain - Likely secondary to cocaine use. - EKG - NSR, w/ troponin negative x 2. - Will continue to monitor closely.   AKI on CKD 2-3 - Creatinine on admission was 2.07 (recent baseline 1.3-1.5). FeNa 0.4% = prerenal.  - Creatinine improving; 1.7 this am.  - Will continue IV fluids as above.  Elevated LFT - AST 70 on admission. - AST/ALT 121/74 today.  - Has had recent Hepatitis panel that was negative. [place] has been ordered.  - Will continue to trend.   Hyponatremia - Likely secondary to volume depletion (FeNa consistent w/ prerenal etiology = 0.4) - IV fluids as above.   GERD  - Treating with Protonix.   FEN/GI: NS @ 75 mL/hr; Regular Diet PPx: Lovenox  Disposition: Pending clinical improvement.   Subjective:  Resting comfortably in bed but reports pain in chest and legs.  Objective: Temp:  [98.1 F  (36.7 C)-99.2 F (37.3 C)] 98.2 F (36.8 C) (02/08 0652) Pulse Rate:  [72-110] 85 (02/08 0853) Resp:  [14-20] 18 (02/08 0853) BP: (86-107)/(43-66) 103/66 mmHg (02/08 0853) SpO2:  [97 %-100 %] 98 % (02/08 0853) Weight:  [123 lb 11.2 oz (56.11 kg)] 123 lb 11.2 oz (56.11 kg) (02/07 2342)  Physical Exam: General: well appearing, NAD Cardiovascular: RRR. 2/6 systolic murmur noted. Respiratory: CTAB. No rales, rhonchi, wheezing. Abdomen: soft, nontender, nondistended. No palpable organomegaly. Extremities: No edema.   Laboratory:  Recent Labs Lab 03/20/14 1407 03/22/14 1845 03/23/14 0545  WBC 9.5 7.1 6.7  HGB 9.1* 10.1* 8.4*  HCT 25.1* 28.8* 23.2*  PLT 271 275 209    Recent Labs Lab 03/20/14 0432 03/22/14 1843 03/22/14 1845 03/23/14 0545  NA 141  --  133* 133*  K 4.7  --  4.7 4.3  CL 112  --  104 106  CO2 21  --  20 20  BUN 12  --  20 21  CREATININE 1.60*  --  2.07* 1.71*  CALCIUM 8.7  --  9.0 8.2*  PROT  --  8.1 8.2 6.6  BILITOT  --  3.5* 3.4* 2.8*  ALKPHOS  --  120* 125* 125*  ALT  --  42 42 74*  AST  --  73* 70* 121*  GLUCOSE 90  --  102* 104*   Cardiac Panel (last 3 results)  Recent Labs  03/22/14 2242 03/23/14  0545  TROPONINI <0.03 <0.03   EKG - NSR.   Imaging/Diagnostic tests:  Dg Chest 2 View 03/22/2014    IMPRESSION: No active cardiopulmonary disease.     Coral Spikes, DO 03/23/2014, 11:24 AM PGY-3, [place] Intern pager: [phone], text pages welcome

## 2014-03-23 NOTE — Progress Notes (Signed)
Called by [place] tech, pt refusing abd [place]. Rationale behind abd [place] explained to pt as he had asked prior to transfer for procedure. Pt refused to speak with this Probation officer on the phone while w/ [place] tech.

## 2014-03-24 ENCOUNTER — Inpatient Hospital Stay (HOSPITAL_COMMUNITY): Payer: Medicare Other

## 2014-03-24 DIAGNOSIS — N189 Chronic kidney disease, unspecified: Secondary | ICD-10-CM

## 2014-03-24 DIAGNOSIS — R112 Nausea with vomiting, unspecified: Secondary | ICD-10-CM | POA: Insufficient documentation

## 2014-03-24 DIAGNOSIS — Z720 Tobacco use: Secondary | ICD-10-CM

## 2014-03-24 DIAGNOSIS — N179 Acute kidney failure, unspecified: Principal | ICD-10-CM

## 2014-03-24 LAB — CBC WITH DIFFERENTIAL/PLATELET
BASOS ABS: 0 10*3/uL (ref 0.0–0.1)
Basophils Relative: 0 % (ref 0–1)
EOS PCT: 2 % (ref 0–5)
Eosinophils Absolute: 0.2 10*3/uL (ref 0.0–0.7)
HCT: 22.5 % — ABNORMAL LOW (ref 39.0–52.0)
Hemoglobin: 8.1 g/dL — ABNORMAL LOW (ref 13.0–17.0)
LYMPHS ABS: 3.1 10*3/uL (ref 0.7–4.0)
Lymphocytes Relative: 34 % (ref 12–46)
MCH: 44 pg — AB (ref 26.0–34.0)
MCHC: 36 g/dL (ref 30.0–36.0)
MCV: 122.3 fL — AB (ref 78.0–100.0)
Monocytes Absolute: 0.6 10*3/uL (ref 0.1–1.0)
Monocytes Relative: 6 % (ref 3–12)
Neutro Abs: 5.3 10*3/uL (ref 1.7–7.7)
Neutrophils Relative %: 58 % (ref 43–77)
PLATELETS: 217 10*3/uL (ref 150–400)
RBC: 1.84 MIL/uL — ABNORMAL LOW (ref 4.22–5.81)
RDW: 18.6 % — ABNORMAL HIGH (ref 11.5–15.5)
WBC: 9.2 10*3/uL (ref 4.0–10.5)

## 2014-03-24 LAB — COMPREHENSIVE METABOLIC PANEL
ALBUMIN: 3.4 g/dL — AB (ref 3.5–5.2)
ALT: 55 U/L — ABNORMAL HIGH (ref 0–53)
ANION GAP: 8 (ref 5–15)
AST: 60 U/L — ABNORMAL HIGH (ref 0–37)
Alkaline Phosphatase: 118 U/L — ABNORMAL HIGH (ref 39–117)
BILIRUBIN TOTAL: 1.5 mg/dL — AB (ref 0.3–1.2)
BUN: 24 mg/dL — ABNORMAL HIGH (ref 6–23)
CO2: 15 mmol/L — ABNORMAL LOW (ref 19–32)
Calcium: 8.5 mg/dL (ref 8.4–10.5)
Chloride: 113 mmol/L — ABNORMAL HIGH (ref 96–112)
Creatinine, Ser: 1.77 mg/dL — ABNORMAL HIGH (ref 0.50–1.35)
GFR calc non Af Amer: 50 mL/min — ABNORMAL LOW (ref >90)
GFR, EST AFRICAN AMERICAN: 57 mL/min — AB (ref >90)
Glucose, Bld: 94 mg/dL (ref 70–99)
POTASSIUM: 4.9 mmol/L (ref 3.5–5.1)
Sodium: 136 mmol/L (ref 135–145)
TOTAL PROTEIN: 6 g/dL (ref 6.0–8.3)

## 2014-03-24 LAB — RAPID URINE DRUG SCREEN, HOSP PERFORMED
Amphetamines: NOT DETECTED
Barbiturates: NOT DETECTED
Benzodiazepines: POSITIVE — AB
Cocaine: NOT DETECTED
Opiates: POSITIVE — AB
Tetrahydrocannabinol: POSITIVE — AB

## 2014-03-24 LAB — HIV ANTIBODY (ROUTINE TESTING W REFLEX): HIV Screen 4th Generation wRfx: NONREACTIVE

## 2014-03-24 LAB — RETICULOCYTES
RBC.: 1.84 MIL/uL — ABNORMAL LOW (ref 4.22–5.81)
Retic Count, Absolute: 147.2 10*3/uL (ref 19.0–186.0)
Retic Ct Pct: 8 % — ABNORMAL HIGH (ref 0.4–3.1)

## 2014-03-24 NOTE — Progress Notes (Signed)
Pt provided with discharge instruction including information on follow up appointments, medications, activity level, and diet. Pt verbalized understanding of all information. IV was dc'd. VSS. Pt was instructed to alert RN when his ride was downstairs, however he left without telling anyone.   Tyna Jaksch, RN

## 2014-03-24 NOTE — Discharge Summary (Signed)
[facility] Discharge Summary  Patient name: [name] [name]. Medical record number: [id number] Date of birth: [birth year] Age: 34 y.o. Gender: male Date of Admission: 03/22/2014  Date of Discharge: 03/24/2013 Admitting Physician: Zigmund Gottron, MD  Primary Care Provider: Tawanna Sat, MD Consultants: None  Indication for Hospitalization: SS Pain Crisis  Discharge Diagnoses/Problem List:  Sickle Cell Disease SS CKD II Tobacco Abuse Migraine Cocaine Abuse Polysubstance Abuse  Disposition: Home  Discharge Condition: Stable  Discharge Exam:  Physical Exam: General: well appearing, NAD Cardiovascular: RRR. 2/6 systolic murmur noted. Respiratory: CTAB. No rales, rhonchi, wheezing. Abdomen: soft, nontender, nondistended. No palpable organomegaly. Extremities: No edema.   Brief Hospital Course:  [name] [name]. is a 34 y.o. male presenting with sickle cell pain crisis. PMH is significant for sickle cell SS anemia, CKD, and GERD. He was admitted with increasing pain over the last several days. He normally receives his pain medications from "Dr. Alyson Ingles on [address]" and a "clinic in [place]". He was admitted to [facility] on 2/4 and discharged on 2/5 at which time his pain was well controlled. He returned because he said "I tried to make it through the weekend, but couldn't". He normally takes percocet 10-325mg  TID. He has been taking 5 at a time to control his pain. Here he has not been tachycardic. His hemoglobin has been around 8.4 / 8.1 here with a baseline of 8-9. Reticulocytes were 7.7 on admission and increased to 8.0 prior to discharge. He was given Toradol, Percocet prn, and Dilaudid. He was admitted for pain control and monitoring of CBC. His pain improved overnight with scheduled toradol and prn percocet. His CBC and reticulocytes were found to be stable. We continued his hydroxyurea during this admission. His pain had  improved in 24 hours. Additionally, he has chronic kidney disease with a creatinine of 1.3 - 1.4. His creatinine at this admission was 1.7 and stable. Likely indicative of new baseline cr. Additionally, he was found to have elevated transaminases in the setting of positive UDS for cocaine. It is thought that he had an ischemic insult 2/2 cocaine induced vasoconstriction as his LFT's were trending down nicely on day of discharge. On the day of discharge, he was found to have pain that was controlled. He was found to be safe and stable for discharge with follow up with his primary care physician.   Issues for Follow Up:  1. Follow up pain control, and chronic pain management with pain contract.  2. Follow up CBC and reticulocytes.   Significant Procedures: None  Significant Labs and Imaging:   Recent Labs Lab 03/22/14 1845 03/23/14 0545 03/24/14 0740  WBC 7.1 6.7 9.2  HGB 10.1* 8.4* 8.1*  HCT 28.8* 23.2* 22.5*  PLT 275 209 217    Recent Labs Lab 03/19/14 1202 03/20/14 0432 03/22/14 1843 03/22/14 1845 03/23/14 0545 03/24/14 0740  NA 139 141  --  133* 133* 136  K 4.8 4.7  --  4.7 4.3 4.9  CL 109 112  --  104 106 113*  CO2 23 21  --  20 20 15*  GLUCOSE 142* 90  --  102* 104* 94  BUN 8 12  --  20 21 24*  CREATININE 1.52* 1.60*  --  2.07* 1.71* 1.77*  CALCIUM 8.6 8.7  --  9.0 8.2* 8.5  ALKPHOS 127*  --  120* 125* 125* 118*  AST 36  --  73* 70* 121* 60*  ALT 21  --  42 42 74* 55*  ALBUMIN 4.2  --  4.5 4.7 3.8 3.4*    Results/Tests Pending at Time of Discharge: None  Discharge Medications:    Medication List    TAKE these medications        acetaminophen 325 MG tablet  Commonly known as:  TYLENOL  Take 650 mg by mouth every 6 (six) hours as needed for mild pain.     diclofenac 75 MG EC tablet  Commonly known as:  VOLTAREN  Take 1 tablet (75 mg total) by mouth 2 (two) times daily.     enalapril 5 MG tablet  Commonly known as:  VASOTEC  Take 1 tablet (5 mg total) by  mouth daily.     hydroxyurea 500 MG capsule  Commonly known as:  HYDREA  Take 3 capsules (1,500 mg total) by mouth daily.     nortriptyline 25 MG capsule  Commonly known as:  PAMELOR  Take 1 capsule (25 mg total) by mouth at bedtime.     Oxycodone HCl 10 MG Tabs  Take 1 tablet (10 mg total) by mouth every 4 (four) hours as needed.     traMADol 50 MG tablet  Commonly known as:  ULTRAM  Take 1 tablet (50 mg total) by mouth every 8 (eight) hours as needed.        Discharge Instructions: Please refer to Patient Instructions section of EMR for full details.  Patient was counseled important signs and symptoms that should prompt return to medical care, changes in medications, dietary instructions, activity restrictions, and follow up appointments.   Follow-Up Appointments: Follow-up Information    Follow up with Tawanna Sat, MD On 04/14/2014.   Specialty:  Family Medicine   Why:  Hospital Follow Up @ 1:45 pm.    Contact information:   [place] [address] [phone]       Aquilla Hacker, MD 03/24/2014, 1:57 PM PGY-1, [place]

## 2014-03-24 NOTE — Discharge Instructions (Signed)
Sickle Cell Anemia, Adult °Sickle cell anemia is a condition in which red blood cells have an abnormal "sickle" shape. This abnormal shape shortens the cells' life span, which results in a lower than normal concentration of red blood cells in the blood. The sickle shape also causes the cells to clump together and block free blood flow through the blood vessels. As a result, the tissues and organs of the body do not receive enough oxygen. Sickle cell anemia causes organ damage and pain and increases the risk of infection. °CAUSES  °Sickle cell anemia is a genetic disorder. Those who receive two copies of the gene have the condition, and those who receive one copy have the trait. °RISK FACTORS °The sickle cell gene is most common in people whose families originated in [place]. Other areas of the globe where sickle cell trait occurs include the Mediterranean, [place] and [place], the [place], and the Middle [place].  °SIGNS AND SYMPTOMS °· Pain, especially in the extremities, back, chest, or abdomen (common). The pain may start suddenly or may develop following an illness, especially if there is dehydration. Pain can also occur due to overexertion or exposure to extreme temperature changes. °· Frequent severe bacterial infections, especially certain types of pneumonia and meningitis. °· Pain and swelling in the hands and feet. °· Decreased activity.   °· Loss of appetite.   °· Change in behavior. °· Headaches. °· Seizures. °· Shortness of breath or difficulty breathing. °· Vision changes. °· Skin ulcers. °Those with the trait may not have symptoms or they may have mild symptoms.  °DIAGNOSIS  °Sickle cell anemia is diagnosed with blood tests that demonstrate the genetic trait. It is often diagnosed during the newborn period, due to mandatory testing nationwide. A variety of blood tests, X-rays, CT scans, MRI scans, ultrasounds, and lung function tests may also be done to monitor the condition. °TREATMENT  °Sickle  cell anemia may be treated with: °· Medicines. You may be given pain medicines, antibiotic medicines (to treat and prevent infections) or medicines to increase the production of certain types of hemoglobin. °· Fluids. °· Oxygen. °· Blood transfusions. °HOME CARE INSTRUCTIONS  °· Drink enough fluid to keep your urine clear or pale yellow. Increase your fluid intake in hot weather and during exercise. °· Do not smoke. Smoking lowers oxygen levels in the blood.   °· Only take over-the-counter or prescription medicines for pain, fever, or discomfort as directed by your health care provider. °· Take antibiotics as directed by your health care provider. Make sure you finish them it even if you start to feel better.   °· Take supplements as directed by your health care provider.   °· Consider wearing a medical alert bracelet. This tells anyone caring for you in an emergency of your condition.   °· When traveling, keep your medical information, health care provider's names, and the medicines you take with you at all times.   °· If you develop a fever, do not take medicines to reduce the fever right away. This could cover up a problem that is developing. Notify your health care provider. °· Keep all follow-up appointments with your health care provider. Sickle cell anemia requires regular medical care. °SEEK MEDICAL CARE IF: ° You have a fever. °SEEK IMMEDIATE MEDICAL CARE IF:  °· You feel dizzy or faint.   °· You have new abdominal pain, especially on the left side near the stomach area.   °· You develop a persistent, often uncomfortable and painful penile erection (priapism). If this is not treated immediately it   will lead to impotence.   You have numbness your arms or legs or you have a hard time moving them.   You have a hard time with speech.   You have a fever or persistent symptoms for more than 2-3 days.   You have a fever and your symptoms suddenly get worse.   You have signs or symptoms of infection.  These include:   Chills.   Abnormal tiredness (lethargy).   Irritability.   Poor eating.   Vomiting.   You develop pain that is not helped with medicine.   You develop shortness of breath.  You have pain in your chest.   You are coughing up pus-like or bloody sputum.   You develop a stiff neck.  Your feet or hands swell or have pain.  Your abdomen appears bloated.  You develop joint pain. MAKE SURE YOU:  Understand these instructions. Document Released: 05/10/2005 Document Revised: 06/16/2013 Document Reviewed: 09/11/2012 [facility] Patient Information 2015 [place], [place]. This information is not intended to replace advice given to you by your health care provider. Make sure you discuss any questions you have with your health care provider.    Thanks for letting us take care of you.   We are glad that you are feeling better.   Follow up with Dr. Lamar Benes on 3/1 at 1:45pm for hospital follow up and continued monitoring of your Hemoglobin levels.   Sincerely,  Paula Compton, MD Family Medicine - PGY 1

## 2014-04-14 ENCOUNTER — Inpatient Hospital Stay: Payer: Medicare Other | Admitting: Family Medicine

## 2014-05-07 ENCOUNTER — Other Ambulatory Visit: Payer: Self-pay | Admitting: *Deleted

## 2014-05-12 ENCOUNTER — Encounter: Payer: Self-pay | Admitting: Family Medicine

## 2014-05-12 ENCOUNTER — Ambulatory Visit (INDEPENDENT_AMBULATORY_CARE_PROVIDER_SITE_OTHER): Payer: Medicare Other | Admitting: Family Medicine

## 2014-05-12 VITALS — BP 100/67 | HR 99 | Temp 98.4°F | Ht 63.0 in | Wt 129.4 lb

## 2014-05-12 DIAGNOSIS — N183 Chronic kidney disease, stage 3 unspecified: Secondary | ICD-10-CM

## 2014-05-12 DIAGNOSIS — D571 Sickle-cell disease without crisis: Secondary | ICD-10-CM | POA: Diagnosis not present

## 2014-05-12 DIAGNOSIS — N182 Chronic kidney disease, stage 2 (mild): Secondary | ICD-10-CM

## 2014-05-12 LAB — CBC
HEMATOCRIT: 26.5 % — AB (ref 39.0–52.0)
Hemoglobin: 9.4 g/dL — ABNORMAL LOW (ref 13.0–17.0)
MCH: 47 pg — AB (ref 26.0–34.0)
MCHC: 35.5 g/dL (ref 30.0–36.0)
MCV: 132.5 fL — AB (ref 78.0–100.0)
MPV: 10.4 fL (ref 8.6–12.4)
Platelets: 285 10*3/uL (ref 150–400)
RBC: 2 MIL/uL — ABNORMAL LOW (ref 4.22–5.81)
RDW: 17.7 % — ABNORMAL HIGH (ref 11.5–15.5)
WBC: 8.3 10*3/uL (ref 4.0–10.5)

## 2014-05-12 LAB — COMPREHENSIVE METABOLIC PANEL
ALT: 25 U/L (ref 0–53)
AST: 33 U/L (ref 0–37)
Albumin: 4.2 g/dL (ref 3.5–5.2)
Alkaline Phosphatase: 168 U/L — ABNORMAL HIGH (ref 39–117)
BUN: 22 mg/dL (ref 6–23)
CALCIUM: 9.2 mg/dL (ref 8.4–10.5)
CO2: 22 mEq/L (ref 19–32)
Chloride: 106 mEq/L (ref 96–112)
Creat: 1.45 mg/dL — ABNORMAL HIGH (ref 0.50–1.35)
Glucose, Bld: 71 mg/dL (ref 70–99)
Potassium: 5.3 mEq/L (ref 3.5–5.3)
Sodium: 137 mEq/L (ref 135–145)
Total Bilirubin: 2.1 mg/dL — ABNORMAL HIGH (ref 0.2–1.2)
Total Protein: 7.1 g/dL (ref 6.0–8.3)

## 2014-05-12 LAB — RETICULOCYTES
ABS Retic: 120 10*3/uL (ref 19.0–186.0)
RBC.: 2 MIL/uL — AB (ref 4.22–5.81)
RETIC CT PCT: 6 % — AB (ref 0.4–2.3)

## 2014-05-12 MED ORDER — KETOROLAC TROMETHAMINE 30 MG/ML IJ SOLN
30.0000 mg | Freq: Once | INTRAMUSCULAR | Status: AC
  Administered 2014-05-12: 30 mg via INTRAMUSCULAR

## 2014-05-12 NOTE — Patient Instructions (Signed)
We will check your kidney, liver function, and blood counts today. If everything is stable we will send you a letter in the mail.  NO ibuprofen/aleve for the next 2 days because of the shot you got.

## 2014-05-12 NOTE — Progress Notes (Signed)
   Subjective:    Patient ID: [name] [name]., male    DOB: 1980-04-22, 34 y.o.   MRN: [id number]  HPI  CC: hospital follow up  # Sickle cell anemia:  Has Drs. in [place] but hasn't been there in a while due to not having a car  Hospitalized in beginning of February  States he has been doing okay since then, but in the last week or so has had pain in his bones in hands (typically sickle cell crisis pain is in legs) ROS: denies fevers, chills, difficulty breathing, chest pain, SOB, cough  # Chronic kidney disease  Pt states he has been told "all about" this in the past  Does not use ibuprofen regularly but occasionally uses aleve ROS: no hematuria, no difficulty urination  Review of Systems   See HPI for ROS. All other systems reviewed and are negative.  Past medical history, surgical, family, and social history reviewed and updated in the EMR as appropriate. Objective:  BP 100/67 mmHg  Pulse 99  Temp(Src) 98.4 F (36.9 C) (Oral)  Ht 5\' 3"  (1.6 m)  Wt 129 lb 6.4 oz (58.695 kg)  BMI 22.93 kg/m2 Vitals and nursing note reviewed  General: NAD CV: RRR, normal heart sounds, no murmurs. 2+ radial pulses b/t Resp: CTAB, normal effort MSK: tender to palpation diffusely both hands, ROM intact wrist/fngers bilaterally Neuro: alert and oriented, no focal deficits. Psych: mood normal, affect congruent. Normal speech.  Assessment & Plan:  See Problem List Documentation

## 2014-05-13 MED ORDER — ENALAPRIL MALEATE 5 MG PO TABS: 5.0000 mg | ORAL_TABLET | Freq: Every day | ORAL | Status: DC

## 2014-05-13 NOTE — Assessment & Plan Note (Signed)
C/o pain flare though not in crisis. Pt requests toradol in office. Discussed his CKD and ongoing use of NSAIDs should be avoided, pt verbalizes understanding and states he is aware of these risks but feels that he needs the toradol shot today; 30mg  IM given in office today. Check CBC, CMET, Retic count today.

## 2014-05-13 NOTE — Assessment & Plan Note (Addendum)
Cmet checked today improved Cr from last hospitalization, back to apparent baseline of 1.4 which would put him at stage 2 CKD. F/u 3 months.

## 2014-05-14 ENCOUNTER — Encounter: Payer: Self-pay | Admitting: Family Medicine

## 2014-07-02 IMAGING — CR DG CHEST 2V
2 series · 2 of 2 positions shown · non-contrast
Comparison: 09/25/2012

CLINICAL DATA: Shortness of breath, sickle cell crisis, pain

EXAM:
CHEST  2 VIEW

[w chest pa]
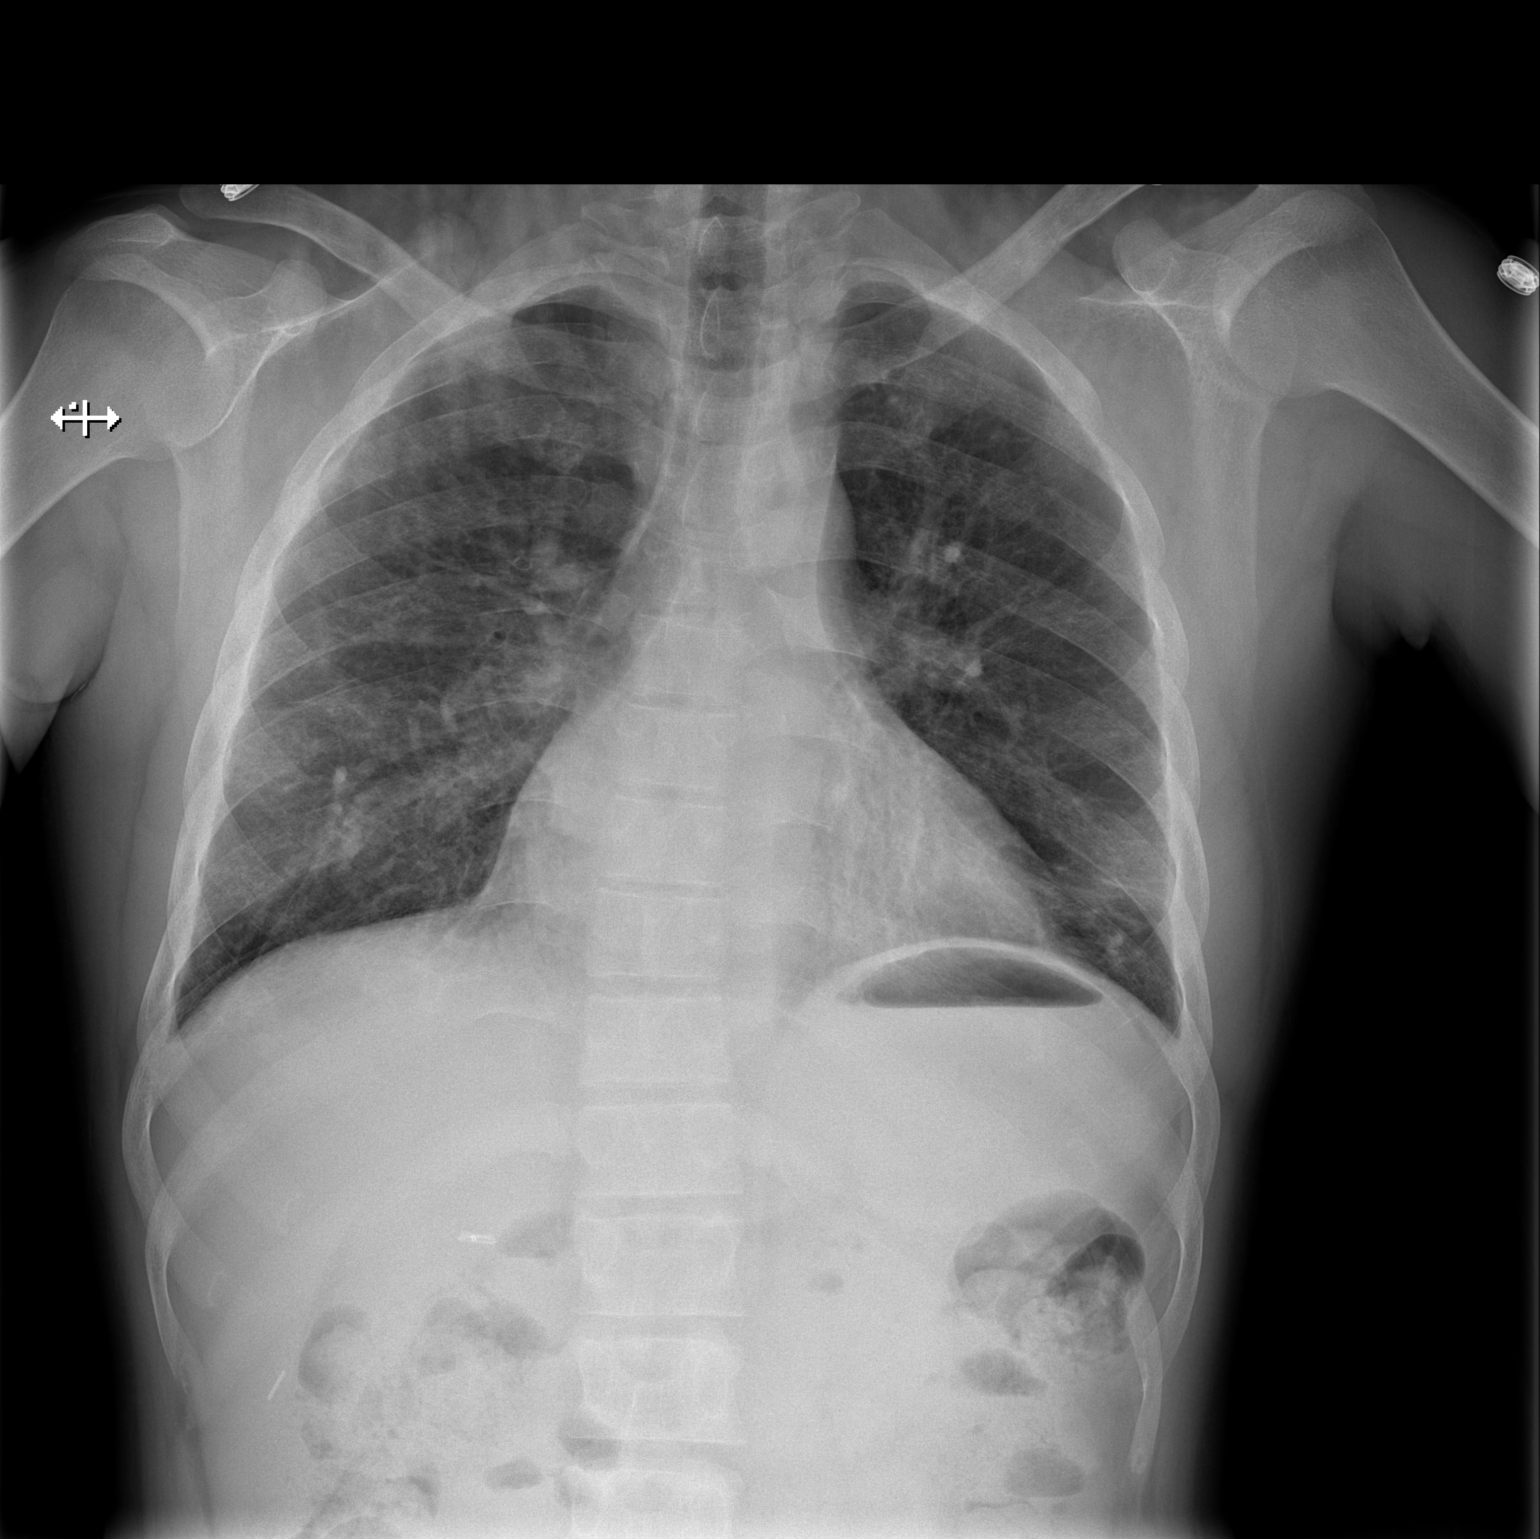

[w chest lat]
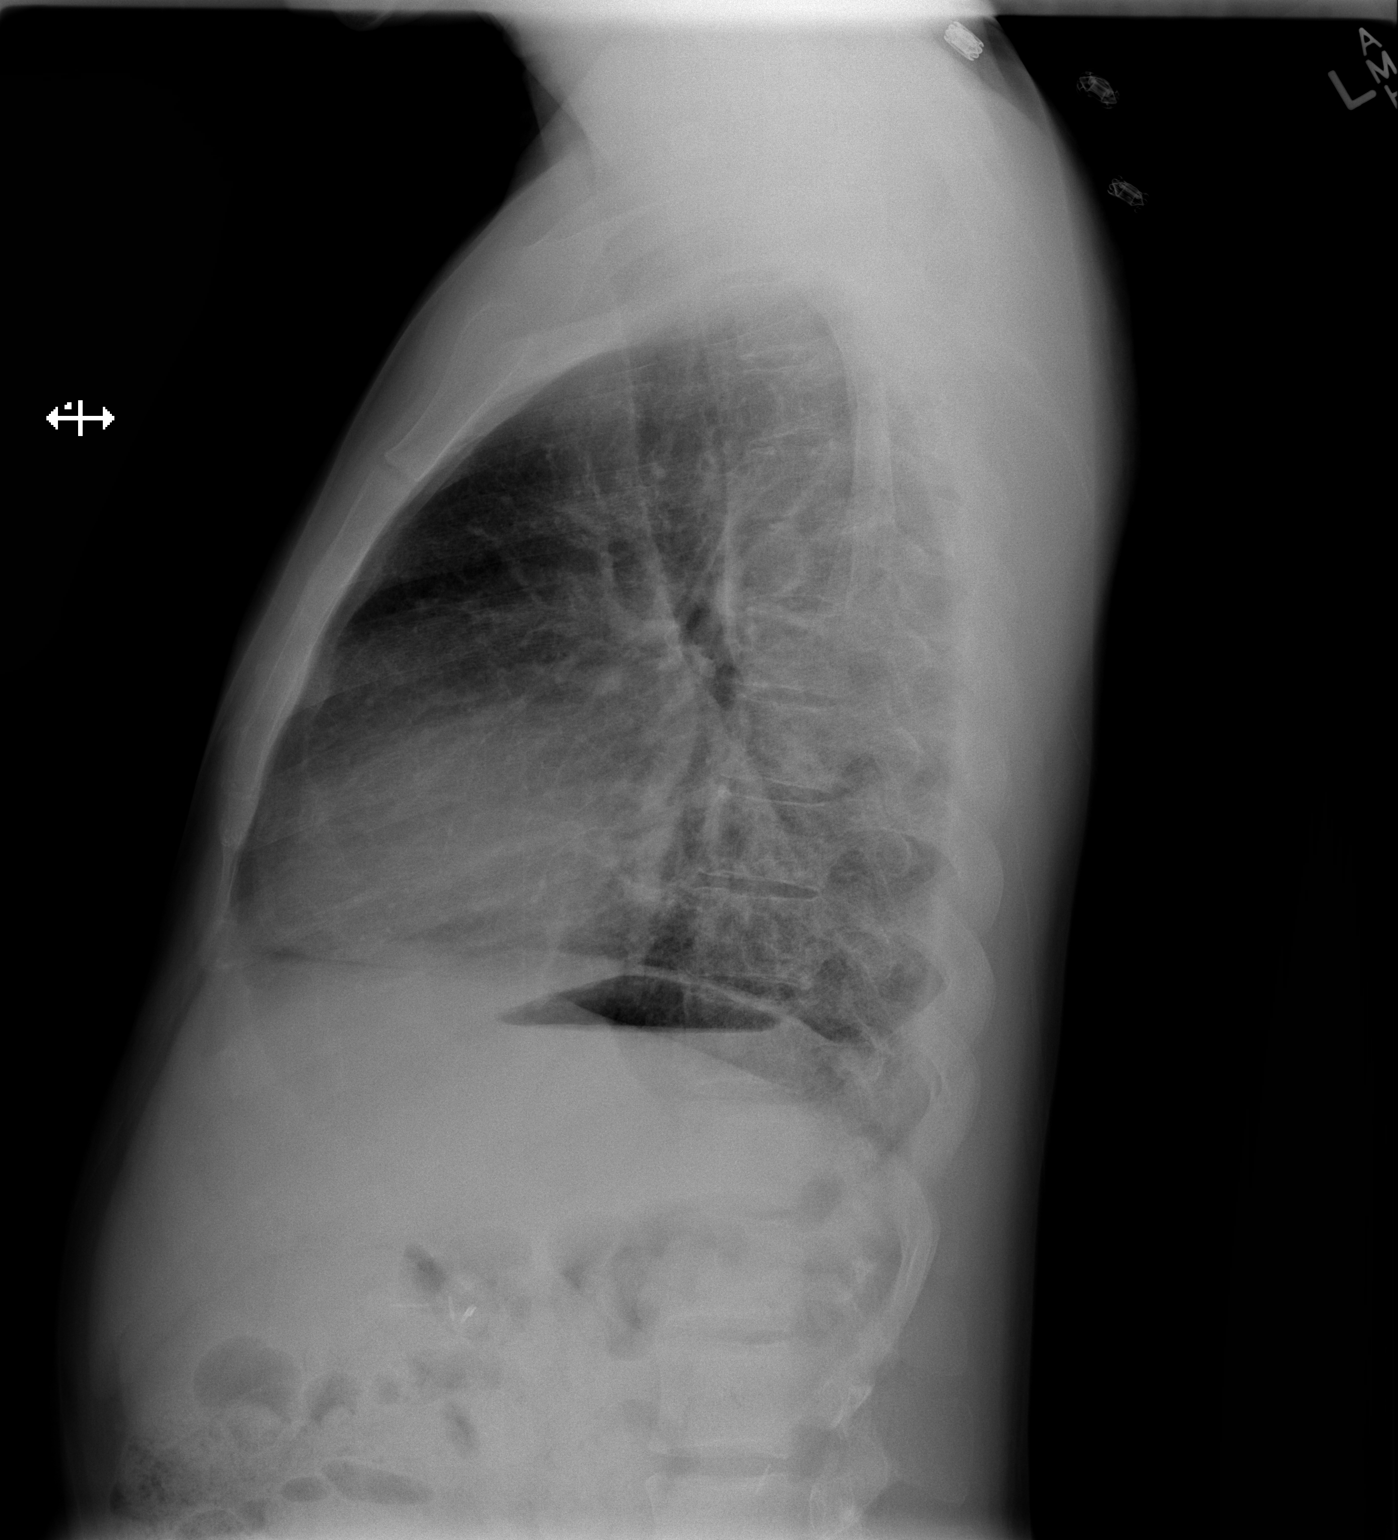

[2 of 2 positions shown; findings below may reference images not displayed]

FINDINGS: Normal heart size and vascularity. Chronic vascular and interstitial
prominence with basilar scarring as before. No significant effusion
or pneumothorax. Trachea is midline. Prior cholecystectomy noted.
Artifact overlies the upper lobes and clavicular regions.
IMPRESSION: Chronic vascular congestion, bronchitic changes and basilar
scarring. No superimposed acute process.

## 2014-08-17 ENCOUNTER — Encounter (HOSPITAL_COMMUNITY): Payer: Self-pay | Admitting: Emergency Medicine

## 2014-08-17 ENCOUNTER — Inpatient Hospital Stay (HOSPITAL_COMMUNITY)
Admission: EM | Admit: 2014-08-17 | Discharge: 2014-08-20 | DRG: 812 | Disposition: A | Discharge status: Home / Self Care | Payer: Medicare Other | Attending: Family Medicine | Admitting: Family Medicine

## 2014-08-17 ENCOUNTER — Emergency Department (HOSPITAL_COMMUNITY): Payer: Medicare Other

## 2014-08-17 DIAGNOSIS — D5700 Hb-SS disease with crisis, unspecified: Principal | ICD-10-CM | POA: Diagnosis present

## 2014-08-17 DIAGNOSIS — N183 Chronic kidney disease, stage 3 (moderate): Secondary | ICD-10-CM | POA: Diagnosis present

## 2014-08-17 DIAGNOSIS — E860 Dehydration: Secondary | ICD-10-CM | POA: Diagnosis present

## 2014-08-17 DIAGNOSIS — F1210 Cannabis abuse, uncomplicated: Secondary | ICD-10-CM | POA: Diagnosis present

## 2014-08-17 DIAGNOSIS — G43909 Migraine, unspecified, not intractable, without status migrainosus: Secondary | ICD-10-CM | POA: Diagnosis present

## 2014-08-17 DIAGNOSIS — N189 Chronic kidney disease, unspecified: Secondary | ICD-10-CM

## 2014-08-17 DIAGNOSIS — Z803 Family history of malignant neoplasm of breast: Secondary | ICD-10-CM

## 2014-08-17 DIAGNOSIS — M79605 Pain in left leg: Secondary | ICD-10-CM | POA: Diagnosis not present

## 2014-08-17 DIAGNOSIS — F17210 Nicotine dependence, cigarettes, uncomplicated: Secondary | ICD-10-CM | POA: Diagnosis present

## 2014-08-17 DIAGNOSIS — Z9049 Acquired absence of other specified parts of digestive tract: Secondary | ICD-10-CM | POA: Diagnosis present

## 2014-08-17 DIAGNOSIS — I129 Hypertensive chronic kidney disease with stage 1 through stage 4 chronic kidney disease, or unspecified chronic kidney disease: Secondary | ICD-10-CM | POA: Diagnosis present

## 2014-08-17 DIAGNOSIS — F1410 Cocaine abuse, uncomplicated: Secondary | ICD-10-CM | POA: Diagnosis present

## 2014-08-17 DIAGNOSIS — Z79899 Other long term (current) drug therapy: Secondary | ICD-10-CM

## 2014-08-17 DIAGNOSIS — N184 Chronic kidney disease, stage 4 (severe): Secondary | ICD-10-CM | POA: Diagnosis present

## 2014-08-17 DIAGNOSIS — R740 Nonspecific elevation of levels of transaminase and lactic acid dehydrogenase [LDH]: Secondary | ICD-10-CM | POA: Diagnosis present

## 2014-08-17 LAB — COMPREHENSIVE METABOLIC PANEL
ALT: 99 U/L — ABNORMAL HIGH (ref 17–63)
AST: 123 U/L — ABNORMAL HIGH (ref 15–41)
Albumin: 4 g/dL (ref 3.5–5.0)
Alkaline Phosphatase: 124 U/L (ref 38–126)
Anion gap: 8 (ref 5–15)
BUN: 31 mg/dL — ABNORMAL HIGH (ref 6–20)
CO2: 14 mmol/L — AB (ref 22–32)
CREATININE: 1.67 mg/dL — AB (ref 0.61–1.24)
Calcium: 8.8 mg/dL — ABNORMAL LOW (ref 8.9–10.3)
Chloride: 113 mmol/L — ABNORMAL HIGH (ref 101–111)
GFR calc Af Amer: >60 mL/min (ref >60)
GFR calc non Af Amer: 53 mL/min — ABNORMAL LOW (ref >60)
Glucose, Bld: 146 mg/dL — ABNORMAL HIGH (ref 65–99)
POTASSIUM: 4.3 mmol/L (ref 3.5–5.1)
Sodium: 135 mmol/L (ref 135–145)
Total Bilirubin: 2.6 mg/dL — ABNORMAL HIGH (ref 0.3–1.2)
Total Protein: 7.5 g/dL (ref 6.5–8.1)

## 2014-08-17 LAB — CBC WITH DIFFERENTIAL/PLATELET
BASOS PCT: 0 % (ref 0–1)
Basophils Absolute: 0 10*3/uL (ref 0.0–0.1)
EOS ABS: 0.2 10*3/uL (ref 0.0–0.7)
Eosinophils Relative: 2 % (ref 0–5)
HEMATOCRIT: 18.9 % — AB (ref 39.0–52.0)
Hemoglobin: 7 g/dL — ABNORMAL LOW (ref 13.0–17.0)
Lymphocytes Relative: 48 % — ABNORMAL HIGH (ref 12–46)
Lymphs Abs: 5.9 10*3/uL — ABNORMAL HIGH (ref 0.7–4.0)
MCH: 43.5 pg — ABNORMAL HIGH (ref 26.0–34.0)
MCHC: 37 g/dL — ABNORMAL HIGH (ref 30.0–36.0)
MCV: 117.4 fL — AB (ref 78.0–100.0)
Monocytes Absolute: 0.6 10*3/uL (ref 0.1–1.0)
Monocytes Relative: 5 % (ref 3–12)
Neutro Abs: 5.4 10*3/uL (ref 1.7–7.7)
Neutrophils Relative %: 45 % (ref 43–77)
Platelets: 187 10*3/uL (ref 150–400)
RBC: 1.61 MIL/uL — ABNORMAL LOW (ref 4.22–5.81)
RDW: 18.2 % — ABNORMAL HIGH (ref 11.5–15.5)
WBC: 12.1 10*3/uL — AB (ref 4.0–10.5)

## 2014-08-17 LAB — RETICULOCYTES
RBC.: 1.61 MIL/uL — ABNORMAL LOW (ref 4.22–5.81)
RETIC COUNT ABSOLUTE: 140.1 10*3/uL (ref 19.0–186.0)
RETIC CT PCT: 8.7 % — AB (ref 0.4–3.1)

## 2014-08-17 MED ORDER — SODIUM CHLORIDE 0.9 % IV BOLUS (SEPSIS)
1000.0000 mL | Freq: Once | INTRAVENOUS | Status: AC
Start: 2014-08-17 — End: 2014-08-18
  Administered 2014-08-17: 1000 mL via INTRAVENOUS

## 2014-08-17 MED ORDER — ONDANSETRON HCL 4 MG/2ML IJ SOLN
4.0000 mg | Freq: Once | INTRAMUSCULAR | Status: AC
  Administered 2014-08-17: 4 mg via INTRAVENOUS
  Filled 2014-08-17: qty 2

## 2014-08-17 MED ORDER — FENTANYL CITRATE (PF) 100 MCG/2ML IJ SOLN
100.0000 ug | Freq: Once | INTRAMUSCULAR | Status: DC
  Filled 2014-08-17: qty 2

## 2014-08-17 MED ORDER — SODIUM CHLORIDE 0.9 % IV BOLUS (SEPSIS)
1000.0000 mL | Freq: Once | INTRAVENOUS | Status: AC
  Administered 2014-08-18: 1000 mL via INTRAVENOUS

## 2014-08-17 MED ORDER — HYDROMORPHONE HCL 1 MG/ML IJ SOLN
1.0000 mg | INTRAMUSCULAR | Status: AC | PRN
  Administered 2014-08-17 (×3): 1 mg via INTRAVENOUS
  Filled 2014-08-17 (×3): qty 1

## 2014-08-17 NOTE — ED Notes (Signed)
Attempted IV x2, unsuccessful.  

## 2014-08-17 NOTE — ED Provider Notes (Signed)
CSN: [id number]     Arrival date & time 08/17/14  2058 History   First MD Initiated Contact with Patient 08/17/14 2111     Chief Complaint  Patient presents with  . Sickle Cell Pain Crisis     (Consider location/radiation/quality/duration/timing/severity/associated sxs/prior Treatment) Patient is a 34 y.o. male presenting with sickle cell pain.  Sickle Cell Pain Crisis Pain location: extremities, back, chest. Severity:  Severe Onset quality:  Gradual Duration:  2 days Similar to previous crisis episodes: yes   Timing:  Constant Progression:  Worsening Chronicity:  Recurrent History of pulmonary emboli: no   Context comment:  Was at the beach Relieved by:  Nothing Worsened by:  Nothing tried Ineffective treatments:  None tried Associated symptoms: chest pain   Associated symptoms: no cough, no fever, no nausea, no swelling of legs and no vomiting     Past Medical History  Diagnosis Date  . Sickle cell anemia   . Sickle cell crisis 09/25/2012  . Bacterial pneumonia ~ 2012    "caught it here in the hospital" (09/25/2012)  . History of blood transfusion     "always related to sickle cell crisis" (09/25/2012)  . GERD (gastroesophageal reflux disease)     "after I eat alot of spicey foods" (09/25/2012)  . Migraines     "take RX qd to prevent them" (09/25/2012)  . Cyst of brain     "2 really small ones in the back of my head; inside; saw them w/MRI" (09/25/2012)  . Chronic kidney disease     "from my sickle cell" (09/25/2012)   Past Surgical History  Procedure Laterality Date  . Cholecystectomy  ~ 2012   Family History  Problem Relation Age of Onset  . Breast cancer Mother    History  Substance Use Topics  . Smoking status: Current Some Day Smoker    Types: Cigarettes  . Smokeless tobacco: Never Used     Comment: 09/25/2012 "I don't buy cigarettes; bum one from friends q now and then"  . Alcohol Use: 0.0 oz/week    0 Standard drinks or equivalent per week     Comment:  occasionally    Review of Systems  Constitutional: Negative for fever.  Respiratory: Negative for cough.   Cardiovascular: Positive for chest pain.  Gastrointestinal: Negative for nausea and vomiting.  All other systems reviewed and are negative.     Allergies  Morphine and related  Home Medications   Prior to Admission medications   Medication Sig Start Date End Date Taking? Authorizing Provider  acetaminophen (TYLENOL) 325 MG tablet Take 650 mg by mouth every 6 (six) hours as needed for mild pain.   Yes Historical Provider, MD  enalapril (VASOTEC) 5 MG tablet Take 1 tablet (5 mg total) by mouth daily. 05/13/14  Yes Leone Brand, MD  hydroxyurea (HYDREA) 500 MG capsule Take 3 capsules (1,500 mg total) by mouth daily. 09/22/13  Yes Donnamae Jude, MD  nortriptyline (PAMELOR) 25 MG capsule Take 1 capsule (25 mg total) by mouth at bedtime. 09/22/13  Yes Donnamae Jude, MD  Oxycodone HCl 10 MG TABS Take 1 tablet (10 mg total) by mouth every 4 (four) hours as needed. 03/20/14  Yes Bryan R Hess, DO  oxyCODONE-acetaminophen (PERCOCET) 10-325 MG per tablet Take 1 tablet by mouth every 4 (four) hours as needed for pain.   Yes Historical Provider, MD  diclofenac (VOLTAREN) 75 MG EC tablet Take 1 tablet (75 mg total) by mouth 2 (two) times  daily. Patient not taking: Reported on 02/23/2014 09/22/13   Donnamae Jude, MD  traMADol (ULTRAM) 50 MG tablet Take 1 tablet (50 mg total) by mouth every 8 (eight) hours as needed. Patient not taking: Reported on 02/23/2014 09/22/13   Donnamae Jude, MD   BP 113/67 mmHg  Pulse 80  Temp(Src) 98.4 F (36.9 C) (Oral)  Resp 18  Ht 5\' 3"  (1.6 m)  Wt 123 lb (55.792 kg)  BMI 21.79 kg/m2  SpO2 95% Physical Exam  Constitutional: He is oriented to person, place, and time. He appears well-developed and well-nourished.  HENT:  Head: Normocephalic and atraumatic.  Eyes: Conjunctivae and EOM are normal.  Neck: Normal range of motion. Neck supple.  Cardiovascular:  Normal rate, regular rhythm and normal heart sounds.   Pulmonary/Chest: Effort normal and breath sounds normal. No respiratory distress.  Abdominal: He exhibits no distension. There is no tenderness. There is no rebound and no guarding.  Musculoskeletal: Normal range of motion.  Neurological: He is alert and oriented to person, place, and time.  Skin: Skin is warm and dry.  Vitals reviewed.   ED Course  Procedures (including critical care time) Labs Review Labs Reviewed  CBC WITH DIFFERENTIAL/PLATELET - Abnormal; Notable for the following:    WBC 12.1 (*)    RBC 1.61 (*)    Hemoglobin 7.0 (*)    HCT 18.9 (*)    MCV 117.4 (*)    MCH 43.5 (*)    MCHC 37.0 (*)    RDW 18.2 (*)    Lymphocytes Relative 48 (*)    Lymphs Abs 5.9 (*)    All other components within normal limits  COMPREHENSIVE METABOLIC PANEL - Abnormal; Notable for the following:    Chloride 113 (*)    CO2 14 (*)    Glucose, Bld 146 (*)    BUN 31 (*)    Creatinine, Ser 1.67 (*)    Calcium 8.8 (*)    AST 123 (*)    ALT 99 (*)    Total Bilirubin 2.6 (*)    GFR calc non Af Amer 53 (*)    All other components within normal limits  RETICULOCYTES - Abnormal; Notable for the following:    Retic Ct Pct 8.7 (*)    RBC. 1.61 (*)    All other components within normal limits  URINE RAPID DRUG SCREEN, HOSP PERFORMED - Abnormal; Notable for the following:    Opiates POSITIVE (*)    Cocaine POSITIVE (*)    Tetrahydrocannabinol POSITIVE (*)    All other components within normal limits  CBC WITH DIFFERENTIAL/PLATELET - Abnormal; Notable for the following:    WBC 15.1 (*)    RBC 1.50 (*)    Hemoglobin 6.5 (*)    HCT 17.8 (*)    MCV 118.7 (*)    MCH 43.3 (*)    MCHC 36.5 (*)    RDW 19.1 (*)    Neutrophils Relative % 34 (*)    Lymphocytes Relative 59 (*)    Lymphs Abs 8.9 (*)    All other components within normal limits  RETICULOCYTES - Abnormal; Notable for the following:    Retic Ct Pct 10.9 (*)    RBC. 1.50 (*)     All other components within normal limits  COMPREHENSIVE METABOLIC PANEL - Abnormal; Notable for the following:    Chloride 115 (*)    CO2 14 (*)    Glucose, Bld 101 (*)    BUN 25 (*)  Creatinine, Ser 1.45 (*)    Calcium 8.7 (*)    AST 96 (*)    ALT 85 (*)    Total Bilirubin 2.6 (*)    All other components within normal limits  GAMMA GT    Imaging Review Dg Chest 2 View  08/17/2014   CLINICAL DATA:  Left lower chest pain and shortness of breath for 2 days.  EXAM: CHEST  2 VIEW  COMPARISON:  March 22, 2014, December 26, 2013  FINDINGS: The heart size and mediastinal contours are within normal limits. There is mild increased pulmonary interstitium unchanged since prior exams, chronic. Increased vascular markings are identified in the right lower lobe unchanged compared to prior exam. There is no focal pneumonia or pleural effusion. The visualized skeletal structures are unremarkable.  IMPRESSION: Mild increased pulmonary interstitium unchanged compared to prior exams, chronic. No focal acute abnormality identified in the lungs.   Electronically Signed   By: Abelardo Diesel M.D.   On: 08/17/2014 21:41     EKG Interpretation   Date/Time:  Monday August 17 2014 21:15:50 EDT Ventricular Rate:  98 PR Interval:  156 QRS Duration: 79 QT Interval:  327 QTC Calculation: 417 R Axis:   73 Text Interpretation:  Sinus rhythm ST elev, probable normal early repol  pattern No significant change since last tracing Confirmed by Debby Freiberg [phone]) on 08/17/2014 10:38:58 PM      MDM   Final diagnoses:  Hb-SS disease with crisis    34 y.o. male with pertinent PMH of sickle cell dz presents with recurrent sickle cell pain, likely from dehydration as he was at the beach until yesterday.  Exam benign.    Wu as above.  Pain unrelieved by dilaudid.  Admitted for pain control.  I have reviewed all laboratory and imaging studies if ordered as above  1. Hb-SS disease with crisis          Debby Freiberg, MD 08/18/14 [phone]

## 2014-08-17 NOTE — ED Notes (Signed)
Pt. reports sickle cell pain at legs , arms and body aches onset today unrelieved by prescription Percocet. Respirations unlabored , no fever or chills.

## 2014-08-18 ENCOUNTER — Encounter (HOSPITAL_COMMUNITY): Payer: Self-pay

## 2014-08-18 DIAGNOSIS — N183 Chronic kidney disease, stage 3 (moderate): Secondary | ICD-10-CM | POA: Diagnosis present

## 2014-08-18 DIAGNOSIS — D5700 Hb-SS disease with crisis, unspecified: Secondary | ICD-10-CM | POA: Diagnosis not present

## 2014-08-18 DIAGNOSIS — F1410 Cocaine abuse, uncomplicated: Secondary | ICD-10-CM | POA: Diagnosis present

## 2014-08-18 DIAGNOSIS — Z803 Family history of malignant neoplasm of breast: Secondary | ICD-10-CM | POA: Diagnosis not present

## 2014-08-18 DIAGNOSIS — Z79899 Other long term (current) drug therapy: Secondary | ICD-10-CM | POA: Diagnosis not present

## 2014-08-18 DIAGNOSIS — Z9049 Acquired absence of other specified parts of digestive tract: Secondary | ICD-10-CM | POA: Diagnosis present

## 2014-08-18 DIAGNOSIS — N182 Chronic kidney disease, stage 2 (mild): Secondary | ICD-10-CM | POA: Diagnosis not present

## 2014-08-18 DIAGNOSIS — I129 Hypertensive chronic kidney disease with stage 1 through stage 4 chronic kidney disease, or unspecified chronic kidney disease: Secondary | ICD-10-CM | POA: Diagnosis present

## 2014-08-18 DIAGNOSIS — F17210 Nicotine dependence, cigarettes, uncomplicated: Secondary | ICD-10-CM | POA: Diagnosis present

## 2014-08-18 DIAGNOSIS — F1910 Other psychoactive substance abuse, uncomplicated: Secondary | ICD-10-CM | POA: Diagnosis not present

## 2014-08-18 DIAGNOSIS — M79605 Pain in left leg: Secondary | ICD-10-CM | POA: Diagnosis present

## 2014-08-18 DIAGNOSIS — G43909 Migraine, unspecified, not intractable, without status migrainosus: Secondary | ICD-10-CM | POA: Diagnosis present

## 2014-08-18 DIAGNOSIS — F1210 Cannabis abuse, uncomplicated: Secondary | ICD-10-CM | POA: Diagnosis present

## 2014-08-18 DIAGNOSIS — E860 Dehydration: Secondary | ICD-10-CM | POA: Diagnosis present

## 2014-08-18 DIAGNOSIS — R740 Nonspecific elevation of levels of transaminase and lactic acid dehydrogenase [LDH]: Secondary | ICD-10-CM | POA: Diagnosis present

## 2014-08-18 LAB — CBC
HCT: 17.3 % — ABNORMAL LOW (ref 39.0–52.0)
Hemoglobin: 6.3 g/dL — CL (ref 13.0–17.0)
MCH: 43.2 pg — ABNORMAL HIGH (ref 26.0–34.0)
MCHC: 36.4 g/dL — AB (ref 30.0–36.0)
MCV: 118.5 fL — ABNORMAL HIGH (ref 78.0–100.0)
PLATELETS: 189 10*3/uL (ref 150–400)
RBC: 1.46 MIL/uL — AB (ref 4.22–5.81)
RDW: 19 % — ABNORMAL HIGH (ref 11.5–15.5)
WBC: 11.3 10*3/uL — ABNORMAL HIGH (ref 4.0–10.5)

## 2014-08-18 LAB — RAPID URINE DRUG SCREEN, HOSP PERFORMED
AMPHETAMINES: NOT DETECTED
Barbiturates: NOT DETECTED
Benzodiazepines: NOT DETECTED
Cocaine: POSITIVE — AB
Opiates: POSITIVE — AB
Tetrahydrocannabinol: POSITIVE — AB

## 2014-08-18 LAB — CBC WITH DIFFERENTIAL/PLATELET
Basophils Absolute: 0 10*3/uL (ref 0.0–0.1)
Basophils Relative: 0 % (ref 0–1)
Eosinophils Absolute: 0.3 10*3/uL (ref 0.0–0.7)
Eosinophils Relative: 2 % (ref 0–5)
HCT: 17.8 % — ABNORMAL LOW (ref 39.0–52.0)
Hemoglobin: 6.5 g/dL — CL (ref 13.0–17.0)
Lymphocytes Relative: 59 % — ABNORMAL HIGH (ref 12–46)
Lymphs Abs: 8.9 10*3/uL — ABNORMAL HIGH (ref 0.7–4.0)
MCH: 43.3 pg — ABNORMAL HIGH (ref 26.0–34.0)
MCHC: 36.5 g/dL — ABNORMAL HIGH (ref 30.0–36.0)
MCV: 118.7 fL — ABNORMAL HIGH (ref 78.0–100.0)
MONO ABS: 0.8 10*3/uL (ref 0.1–1.0)
MONOS PCT: 5 % (ref 3–12)
NEUTROS PCT: 34 % — AB (ref 43–77)
Neutro Abs: 5.1 10*3/uL (ref 1.7–7.7)
PLATELETS: 179 10*3/uL (ref 150–400)
RBC: 1.5 MIL/uL — ABNORMAL LOW (ref 4.22–5.81)
RDW: 19.1 % — ABNORMAL HIGH (ref 11.5–15.5)
WBC: 15.1 10*3/uL — AB (ref 4.0–10.5)

## 2014-08-18 LAB — COMPREHENSIVE METABOLIC PANEL
ALBUMIN: 3.8 g/dL (ref 3.5–5.0)
ALT: 85 U/L — AB (ref 17–63)
ANION GAP: 8 (ref 5–15)
AST: 96 U/L — AB (ref 15–41)
Alkaline Phosphatase: 111 U/L (ref 38–126)
BUN: 25 mg/dL — AB (ref 6–20)
CHLORIDE: 115 mmol/L — AB (ref 101–111)
CO2: 14 mmol/L — ABNORMAL LOW (ref 22–32)
Calcium: 8.7 mg/dL — ABNORMAL LOW (ref 8.9–10.3)
Creatinine, Ser: 1.45 mg/dL — ABNORMAL HIGH (ref 0.61–1.24)
GFR calc non Af Amer: >60 mL/min (ref >60)
Glucose, Bld: 101 mg/dL — ABNORMAL HIGH (ref 65–99)
POTASSIUM: 4.6 mmol/L (ref 3.5–5.1)
SODIUM: 137 mmol/L (ref 135–145)
TOTAL PROTEIN: 6.8 g/dL (ref 6.5–8.1)
Total Bilirubin: 2.6 mg/dL — ABNORMAL HIGH (ref 0.3–1.2)

## 2014-08-18 LAB — GAMMA GT: GGT: 33 U/L (ref 7–50)

## 2014-08-18 LAB — RETICULOCYTES
RBC.: 1.5 MIL/uL — ABNORMAL LOW (ref 4.22–5.81)
RETIC COUNT ABSOLUTE: 163.5 10*3/uL (ref 19.0–186.0)
RETIC CT PCT: 10.9 % — AB (ref 0.4–3.1)

## 2014-08-18 LAB — PREPARE RBC (CROSSMATCH)

## 2014-08-18 MED ORDER — SODIUM CHLORIDE 0.9 % IV SOLN: Freq: Once | INTRAVENOUS | Status: DC

## 2014-08-18 MED ORDER — HYDROMORPHONE HCL 1 MG/ML IJ SOLN
0.5000 mg | INTRAMUSCULAR | Status: DC | PRN
  Administered 2014-08-18 – 2014-08-19 (×7): 0.5 mg via INTRAVENOUS
  Filled 2014-08-18 (×8): qty 1

## 2014-08-18 MED ORDER — VITAMIN B-1 100 MG PO TABS
100.0000 mg | ORAL_TABLET | Freq: Every day | ORAL | Status: DC
  Administered 2014-08-19 – 2014-08-20 (×2): 100 mg via ORAL
  Filled 2014-08-18 (×3): qty 1

## 2014-08-18 MED ORDER — HYDROMORPHONE HCL 1 MG/ML IJ SOLN
1.0000 mg | Freq: Once | INTRAMUSCULAR | Status: AC
  Administered 2014-08-18: 1 mg via INTRAVENOUS
  Filled 2014-08-18: qty 1

## 2014-08-18 MED ORDER — NORTRIPTYLINE HCL 25 MG PO CAPS
25.0000 mg | ORAL_CAPSULE | Freq: Every day | ORAL | Status: DC
  Administered 2014-08-19: 25 mg via ORAL
  Filled 2014-08-18 (×2): qty 1

## 2014-08-18 MED ORDER — ENOXAPARIN SODIUM 40 MG/0.4ML ~~LOC~~ SOLN
40.0000 mg | SUBCUTANEOUS | Status: DC
  Administered 2014-08-19: 40 mg via SUBCUTANEOUS
  Filled 2014-08-18: qty 0.4

## 2014-08-18 MED ORDER — FOLIC ACID 1 MG PO TABS
1.0000 mg | ORAL_TABLET | Freq: Every day | ORAL | Status: DC
  Administered 2014-08-19 – 2014-08-20 (×2): 1 mg via ORAL
  Filled 2014-08-18 (×2): qty 1

## 2014-08-18 MED ORDER — LORAZEPAM 2 MG/ML IJ SOLN: 1.0000 mg | Freq: Four times a day (QID) | INTRAMUSCULAR | Status: DC | PRN

## 2014-08-18 MED ORDER — DIPHENHYDRAMINE HCL 50 MG/ML IJ SOLN
25.0000 mg | Freq: Once | INTRAMUSCULAR | Status: AC
  Administered 2014-08-18: 25 mg via INTRAVENOUS
  Filled 2014-08-18: qty 1

## 2014-08-18 MED ORDER — HYDROMORPHONE HCL 1 MG/ML IJ SOLN
0.5000 mg | Freq: Once | INTRAMUSCULAR | Status: AC
  Administered 2014-08-18: 0.5 mg via INTRAVENOUS
  Filled 2014-08-18: qty 1

## 2014-08-18 MED ORDER — HYDROXYUREA 500 MG PO CAPS
1500.0000 mg | ORAL_CAPSULE | Freq: Every day | ORAL | Status: DC
  Administered 2014-08-18 – 2014-08-20 (×3): 1500 mg via ORAL
  Filled 2014-08-18 (×3): qty 3

## 2014-08-18 MED ORDER — LORAZEPAM 2 MG/ML IJ SOLN
0.5000 mg | Freq: Four times a day (QID) | INTRAMUSCULAR | Status: DC | PRN
  Administered 2014-08-19: 0.5 mg via INTRAVENOUS
  Filled 2014-08-18: qty 1

## 2014-08-18 MED ORDER — HYDROMORPHONE HCL 1 MG/ML IJ SOLN
1.0000 mg | INTRAMUSCULAR | Status: DC | PRN
  Administered 2014-08-18: 1 mg via INTRAVENOUS
  Filled 2014-08-18: qty 1

## 2014-08-18 MED ORDER — THIAMINE HCL 100 MG/ML IJ SOLN: 100.0000 mg | Freq: Every day | INTRAMUSCULAR | Status: DC

## 2014-08-18 MED ORDER — LORAZEPAM 0.5 MG PO TABS: 0.5000 mg | ORAL_TABLET | Freq: Four times a day (QID) | ORAL | Status: DC | PRN

## 2014-08-18 MED ORDER — LORAZEPAM 1 MG PO TABS: 1.0000 mg | ORAL_TABLET | Freq: Four times a day (QID) | ORAL | Status: DC | PRN

## 2014-08-18 MED ORDER — DEXTROSE-NACL 5-0.45 % IV SOLN
INTRAVENOUS | Status: DC
  Administered 2014-08-18 (×2): via INTRAVENOUS

## 2014-08-18 MED ORDER — DIPHENHYDRAMINE HCL 25 MG PO CAPS
25.0000 mg | ORAL_CAPSULE | Freq: Four times a day (QID) | ORAL | Status: DC | PRN
  Administered 2014-08-18: 25 mg via ORAL
  Filled 2014-08-18: qty 1

## 2014-08-18 MED ORDER — POLYETHYLENE GLYCOL 3350 17 G PO PACK: 17.0000 g | PACK | Freq: Every day | ORAL | Status: DC | PRN

## 2014-08-18 MED ORDER — SENNOSIDES-DOCUSATE SODIUM 8.6-50 MG PO TABS
1.0000 | ORAL_TABLET | Freq: Two times a day (BID) | ORAL | Status: DC
  Administered 2014-08-20: 1 via ORAL
  Filled 2014-08-18 (×4): qty 1

## 2014-08-18 MED ORDER — KETOROLAC TROMETHAMINE 30 MG/ML IJ SOLN
30.0000 mg | Freq: Once | INTRAMUSCULAR | Status: AC
  Administered 2014-08-18: 30 mg via INTRAVENOUS
  Filled 2014-08-18: qty 1

## 2014-08-18 MED ORDER — ADULT MULTIVITAMIN W/MINERALS CH
1.0000 | ORAL_TABLET | Freq: Every day | ORAL | Status: DC
  Administered 2014-08-19 – 2014-08-20 (×2): 1 via ORAL
  Filled 2014-08-18 (×3): qty 1

## 2014-08-18 MED ORDER — FOLIC ACID 1 MG PO TABS: 1.0000 mg | ORAL_TABLET | Freq: Every day | ORAL | Status: DC

## 2014-08-18 NOTE — ED Notes (Signed)
Entered room to give pain meds, pt agitated, eyes widened/bulging, asking "why you trying to play mind games." Speech mildly slurred, pt expressing paranoid delusions r/t urine sample in previous note. Explained to pt that no one was "playing mind games." Administered pain meds and pt began to have snoring respirations, even. Pt difficult to arouse.

## 2014-08-18 NOTE — Progress Notes (Signed)
Patient removed telemetry due to discomfort and refused to have it replaced. MD notified and gave orders that it was okay to D/C cardiac monitoring.

## 2014-08-18 NOTE — H&P (Signed)
[facility] Admission History and Physical Service Pager: [phone]  Patient name: [name] [name]. Medical record number: [id number] Date of birth: [birth year] Age: 34 y.o. Gender: male  Primary Care Provider: Tawanna Sat, MD Consultants: None  Code Status: Full per discussion on admission  Chief Complaint: sickle cell pain crisis  Assessment and Plan: Dixon Luczak. is a 34 y.o. male presenting with pain in his arms, legs, and back for 3 days consistent with his typical sickle cell pain crisis.  PMH is significant for sickle cell anemia, HTN,  CKD, polysubstance abuse.   Sickle cell pain crisis: Patient with a h/o hemoglobin SS sickle cell disease. Pain in his arms, legs, and back typical of his typical crisis per his report.  He has been taking Percocet at home without improvement. From previous notes does not appears like he is receiving narcotic Rx from our clinic due to h/o cocaine abuse and concerns of selling opiates (as he was negative for opiates) on UDS. Patient becomes agitated when I ask who has prescribed these. Hemoglobin low at 7.0, baseline appears to be around 8. Retic up to 8.7%. Patient asymptomatic currently. He did endorse occasional chest pain. CXR with mild increased pulmonary interstitium but stable, no fevers SOB, or cough.  - Place in observation, attending Dr. Erin Hearing - Dilaudid 67m q2hrs PRN pain, transition quickly to orals - Not giving Toradol given renal function - Sennok-S BID, MiraLax PRN - continue to monitor O2 saturations, supplement O2 as needed - D5-1/2NS @ 3/4 MIVF  - repeat CBC with diff and retic in AM, consider transfusion if less than 7 and symptomatic  - K pads for pian  - continue home hydroxyurea  CKD, stage 3: baseline SCr appears to be around 1.7 (however was 1.45 on last check).  - Holding home enalapril  - avoid nephrotoxic agents - renally dose medications  Transaminitis: AST and ALT have  intermittently been elevated. Alk phos normal making this less likely cholestatic in nature (less likely retained stone in the CBD given pt is s/p cholecystectomy).  Currently in a >2:1 ratio of AST:ALT consistent with alcohol. Viral hepatitis panel negative in 2015. - Given his time in the ED, I do not feel ethanol level would be beneficial  - will get GGT - Repeat CMET in AM  Polysubstance abuse: Patient with long h/o substance abuse.  - UDS with +THC, cocaine, and opiates - CIWA protocol - CSW consult  FEN/GI: D5-1/2NS @ 60cc/hr, regular diet Prophylaxis: SQ Lovenox  Disposition: place in observation, attending Dr. cErin Hearing  History of Present Illness: [name] [name] is a 34y.o. male presenting with bilateral arm, leg and back bain for 3 days secondary to sickle cell crisis. He states that "everywhere" hurts. He states he would like more pain medication but has been getting Dilaudid while in the ED. Endorses occasional chest pain centrally located.  Denies swelling in joints, no cough, SOB, or fever. No fatigue or decreased energy. Takes Tylenol and Percocet at home for pain, which he reports doesn't help. Cannot tell me where he gets his Rx for percocet from. No recent illnesses or excessive dehydration. He admits that he went to the beach yesterday which may be the cause. Sees [facility] for sickle cell follow up, however has not been seen in a while. He has required blood transfusions in the past.  In the ED, he was given Dilaudid 190mx 4 doses and 1 L NS  bolus x 2. CXR did not reveal any acute changes. Per RN staff, the patient was noted to have some paranoid thinking, becoming frustrated when she asked him for a urine sample and discussed she was concerned about his somnolence.    Review Of Systems: Per HPI with the following additions: No abdominal pain, nausea, vomiting, or diarrhea. Otherwise 12 point review of systems was performed and was unremarkable.  Patient  Active Problem List   Diagnosis Date Noted  . Sickle cell anemia 08/18/2014  . Nausea with vomiting   . Polysubstance abuse 03/22/2014  . Chest pain   . Sickle cell crisis 03/19/2014  . Vaso-occlusive sickle cell crisis 05/09/2013  . Cocaine abuse 09/26/2012  . Acute-on-chronic kidney injury 09/26/2012  . Systolic murmur 91/63/8466  . Migraine 11/26/2009  . CHRONIC KIDNEY DISEASE STAGE II (MILD) 03/06/2009  . Sickle cell disease, type SS. No outpatient prescriptions from [facility] due to cocaine positive, opiate negative on high doses of given narcotics 06/18/2008  . TOBACCO ABUSE 05/22/2007   Past Medical History: Past Medical History  Diagnosis Date  . Sickle cell anemia   . Sickle cell crisis 09/25/2012  . Bacterial pneumonia ~ 2012    "caught it here in the hospital" (09/25/2012)  . History of blood transfusion     "always related to sickle cell crisis" (09/25/2012)  . GERD (gastroesophageal reflux disease)     "after I eat alot of spicey foods" (09/25/2012)  . Migraines     "take RX qd to prevent them" (09/25/2012)  . Cyst of brain     "2 really small ones in the back of my head; inside; saw them w/MRI" (09/25/2012)  . Chronic kidney disease     "from my sickle cell" (09/25/2012)   Past Surgical History: Past Surgical History  Procedure Laterality Date  . Cholecystectomy  ~ 2012   Social History: History  Substance Use Topics  . Smoking status: Current Some Day Smoker    Types: Cigarettes  . Smokeless tobacco: Never Used     Comment: 09/25/2012 "I don't buy cigarettes; bum one from friends q now and then"  . Alcohol Use: 0.0 oz/week    0 Standard drinks or equivalent per week     Comment: occasionally   Additional social history: Pt admits to 1 beer/week. Denies any illicit drugs.  Please also refer to relevant sections of EMR.  Family History: Family History  Problem Relation Age of Onset  . Breast cancer Mother    Allergies and Medications: Allergies  Allergen  Reactions  . Morphine And Related Other (See Comments)    "real bad headaches"   No current facility-administered medications on file prior to encounter.   Current Outpatient Prescriptions on File Prior to Encounter  Medication Sig Dispense Refill  . acetaminophen (TYLENOL) 325 MG tablet Take 650 mg by mouth every 6 (six) hours as needed for mild pain.    Marland Kitchen enalapril (VASOTEC) 5 MG tablet Take 1 tablet (5 mg total) by mouth daily. 90 tablet 3  . hydroxyurea (HYDREA) 500 MG capsule Take 3 capsules (1,500 mg total) by mouth daily. 90 capsule 5  . nortriptyline (PAMELOR) 25 MG capsule Take 1 capsule (25 mg total) by mouth at bedtime. 30 capsule 11  . Oxycodone HCl 10 MG TABS Take 1 tablet (10 mg total) by mouth every 4 (four) hours as needed. 15 tablet 0  . diclofenac (VOLTAREN) 75 MG EC tablet Take 1 tablet (75 mg total) by mouth 2 (two)  times daily. (Patient not taking: Reported on 02/23/2014) 30 tablet 0  . traMADol (ULTRAM) 50 MG tablet Take 1 tablet (50 mg total) by mouth every 8 (eight) hours as needed. (Patient not taking: Reported on 02/23/2014) 30 tablet 1    Objective: BP 115/78 mmHg  Pulse 95  Temp(Src) 98 F (36.7 C) (Oral)  Resp 19  Ht _0  (1.6 m)  Wt 123 lb (55.792 kg)  BMI 21.79 kg/m2  SpO2 93% Exam: General: Lying in bed in NAD, sleeping. Non-toxic. Doesn't want to speak with me, keeps asking for more pain medication and becomes frustrated at questioning.  Eyes: Conjunctivae non-injected. Sclera anicteric  ENTM: Moist mucous membranes. Oropharynx clear. No nasal discharge.  Neck: Supple, no LAD Cardiovascular: RRR. II/VI systolic murmur noted best at the left upper sternal border. No rubs or gallops noted. No pitting edema noted. Respiratory: No increased WOB. Occasional fine crackle noted bilaterally. No wheezing or rhonchi.  Abdomen: +BS, soft, non-distended, non-tender. Unable to palpate spleen.  MSK: Normal bulk and noted. No gross deformities noted. Slight TTP  over the thoracic spine and paraspinal muscles b/l. Some TTP over anterior shins bilaterally. No joint effusions, erythema, or warmth noted.  Skin: No rashes noted. Several tattoos  Neuro: A&O. No gross neurologic deficits.  Psych:  Unable to assess, pt frustrated with lines of questioning and states he doesn't feel they are helpful for his pain.  Labs and Imaging: CBC BMET   Recent Labs Lab 08/17/14 2107  WBC 12.1*  HGB 7.0*  HCT 18.9*  PLT 187    Recent Labs Lab 08/17/14 2107  NA 135  K 4.3  CL 113*  CO2 14*  BUN 31*  CREATININE 1.67*  GLUCOSE 146*  CALCIUM 8.8*    Retic: 8.7%  UDS: + opiates, cocaine, THC  Hepatic Function Latest Ref Rng 08/17/2014 05/12/2014 03/24/2014  Total Protein 6.5 - 8.1 g/dL 7.5 7.1 6.0  Albumin 3.5 - 5.0 g/dL 4.0 4.2 3.4(L)  AST 15 - 41 U/L 123(H) 33 60(H)  ALT 17 - 63 U/L 99(H) 25 55(H)  Alk Phosphatase 38 - 126 U/L 124 168(H) 118(H)  Total Bilirubin 0.3 - 1.2 mg/dL 2.6(H) 2.1(H) 1.5(H)  Bilirubin, Direct 0.0 - 0.5 mg/dL - - -     Dg Chest 2 View  08/17/2014   CLINICAL DATA:  Left lower chest pain and shortness of breath for 2 days.  EXAM: CHEST  2 VIEW  COMPARISON:  March 22, 2014, December 26, 2013  FINDINGS: The heart size and mediastinal contours are within normal limits. There is mild increased pulmonary interstitium unchanged since prior exams, chronic. Increased vascular markings are identified in the right lower lobe unchanged compared to prior exam. There is no focal pneumonia or pleural effusion. The visualized skeletal structures are unremarkable.  IMPRESSION: Mild increased pulmonary interstitium unchanged compared to prior exams, chronic. No focal acute abnormality identified in the lungs.   Electronically Signed   By: Abelardo Diesel M.D.   On: 08/17/2014 21:41    Archie Patten, MD 08/18/2014, 4:09 AM PGY-2, [facility] Intern pager: [phone], text pages welcome

## 2014-08-18 NOTE — ED Notes (Signed)
This nurse entered to room, found pt difficult to arouse but ABC intact.  This nurse woke pt, stated "I don't want to give you more pain medicine until we have some urine from you."  Pt became angry, stated, "You trying to play mind games with me?  You want to play with me?"  This nurse stated concern over somnolent state, pt continued to make paranoid comments.  This nurse left the room.

## 2014-08-18 NOTE — Progress Notes (Signed)
Pt came form ED to 6N08. A/Ox4, no new oder at this time. Vs stable. Will continue to monitor.

## 2014-08-19 DIAGNOSIS — D5700 Hb-SS disease with crisis, unspecified: Secondary | ICD-10-CM | POA: Insufficient documentation

## 2014-08-19 DIAGNOSIS — N182 Chronic kidney disease, stage 2 (mild): Secondary | ICD-10-CM

## 2014-08-19 DIAGNOSIS — F1910 Other psychoactive substance abuse, uncomplicated: Secondary | ICD-10-CM

## 2014-08-19 LAB — TYPE AND SCREEN
ABO/RH(D): O POS
Antibody Screen: NEGATIVE
DONOR AG TYPE: NEGATIVE
Unit division: 0

## 2014-08-19 LAB — COMPREHENSIVE METABOLIC PANEL
ALK PHOS: 112 U/L (ref 38–126)
ALT: 67 U/L — ABNORMAL HIGH (ref 17–63)
AST: 69 U/L — ABNORMAL HIGH (ref 15–41)
Albumin: 3.5 g/dL (ref 3.5–5.0)
Anion gap: 7 (ref 5–15)
BUN: 17 mg/dL (ref 6–20)
CO2: 18 mmol/L — ABNORMAL LOW (ref 22–32)
Calcium: 8.7 mg/dL — ABNORMAL LOW (ref 8.9–10.3)
Chloride: 114 mmol/L — ABNORMAL HIGH (ref 101–111)
Creatinine, Ser: 1.33 mg/dL — ABNORMAL HIGH (ref 0.61–1.24)
Glucose, Bld: 92 mg/dL (ref 65–99)
POTASSIUM: 4.2 mmol/L (ref 3.5–5.1)
SODIUM: 139 mmol/L (ref 135–145)
TOTAL PROTEIN: 6.5 g/dL (ref 6.5–8.1)
Total Bilirubin: 2.5 mg/dL — ABNORMAL HIGH (ref 0.3–1.2)

## 2014-08-19 LAB — CBC
HCT: 23.2 % — ABNORMAL LOW (ref 39.0–52.0)
Hemoglobin: 8.5 g/dL — ABNORMAL LOW (ref 13.0–17.0)
MCH: 39.4 pg — ABNORMAL HIGH (ref 26.0–34.0)
MCHC: 36.6 g/dL — ABNORMAL HIGH (ref 30.0–36.0)
MCV: 107.4 fL — AB (ref 78.0–100.0)
Platelets: 226 10*3/uL (ref 150–400)
RBC: 2.16 MIL/uL — ABNORMAL LOW (ref 4.22–5.81)
WBC: 12 10*3/uL — AB (ref 4.0–10.5)

## 2014-08-19 MED ORDER — HYDROMORPHONE HCL 1 MG/ML IJ SOLN
0.5000 mg | INTRAMUSCULAR | Status: DC | PRN
  Administered 2014-08-19 – 2014-08-20 (×4): 0.5 mg via INTRAVENOUS
  Filled 2014-08-19 (×4): qty 1

## 2014-08-19 MED ORDER — SODIUM CHLORIDE 0.9 % IV BOLUS (SEPSIS)
1000.0000 mL | Freq: Once | INTRAVENOUS | Status: AC
  Administered 2014-08-19: 1000 mL via INTRAVENOUS

## 2014-08-19 MED ORDER — SODIUM CHLORIDE 0.9 % IV BOLUS (SEPSIS)
500.0000 mL | Freq: Once | INTRAVENOUS | Status: AC
  Administered 2014-08-19: 500 mL via INTRAVENOUS

## 2014-08-19 MED ORDER — OXYCODONE HCL 5 MG PO TABS
5.0000 mg | ORAL_TABLET | Freq: Once | ORAL | Status: DC
  Filled 2014-08-19: qty 1

## 2014-08-19 MED ORDER — OXYCODONE HCL 5 MG PO TABS
5.0000 mg | ORAL_TABLET | ORAL | Status: DC | PRN
  Administered 2014-08-19 (×3): 5 mg via ORAL
  Filled 2014-08-19 (×4): qty 1

## 2014-08-19 MED ORDER — HYDROMORPHONE HCL 1 MG/ML IJ SOLN
0.5000 mg | Freq: Once | INTRAMUSCULAR | Status: AC
  Administered 2014-08-19: 0.5 mg via INTRAVENOUS
  Filled 2014-08-19: qty 1

## 2014-08-19 NOTE — Progress Notes (Signed)
Family Medicine Teaching Service Daily Progress Note Intern Pager: [phone]  Patient name: [name] [name]. Medical record number: [id number] Date of birth: 01-Oct-1980 Age: 34 y.o. Gender: male  Primary Care Provider: Tawanna Sat, MD Consultants: none Code Status: full per discussion on admission  Pt Overview and Major Events to Date:  07/05 Admit to telemetry  Assessment and Plan: Milferd Ansell. is a 34 y.o. male presenting with pain in his arms, legs, and back for 3 days consistent with his typical sickle cell pain crisis. PMH is significant for sickle cell anemia, HTN, CKD, polysubstance abuse.   Sickle cell pain crisis: Patient with a h/o hemoglobin SS sickle cell disease. Pain in his arms, legs, and back typical of his typical crisis per his report. He has been taking Percocet at home without improvement. From previous notes does not appears like he is receiving narcotic Rx from our clinic due to h/o cocaine abuse and concerns of selling opiates (as he was negative for opiates) on UDS. Patient becomes agitated when I ask who has prescribed these. Hemoglobin low at 7.0, baseline appears to be around 8. Retic up to 8.7%. Patient asymptomatic currently. He did endorse occasional chest pain. CXR with mild increased pulmonary interstitium but stable, no fevers SOB, or cough.  - Place in observation, attending Dr. Erin Hearing - Dilaudid 70m q2hrs PRN pain, transition quickly to orals - Toradol given - Sennok-S BID, MiraLax PRN - continue to monitor O2 saturations, supplement O2 as needed - D5-1/2NS @ 3/4 MIVF  - Consider another transfusion if less than 7 and symptomatic  - K pads for pain  - continue home hydroxyurea  CKD, stage 3: baseline SCr appears to be around 1.7 (however was 1.45 on last check). Toradol was given, will need to monitor renal function.  - Holding home enalapril  - avoid nephrotoxic agents - renally dose medications  Transaminitis: AST and ALT  have intermittently been elevated. Alk phos normal making this less likely cholestatic in nature (less likely retained stone in the CBD given pt is s/p cholecystectomy). Currently in a >2:1 ratio of AST:ALT consistent with alcohol. Viral hepatitis panel negative in 2015. - Given his time in the ED, I do not feel ethanol level would be beneficial  - GGT 33, AST 69, ALT 97  Polysubstance abuse: Patient with long h/o substance abuse.  - UDS with +THC, cocaine, and opiates - CIWA protocol - CSW consult  FEN/GI: D5-1/2NS @ 60cc/hr, regular diet Prophylaxis: SQ Lovenox   Disposition: stable, ready for home once pain is controlled  Subjective:  [name] [name] is a 34y.o. male presenting with pain in his arms, legs, and back for 3 days consistent with his typical sickle cell pain crisis. PMH is significant for sickle cell anemia, HTN, CKD, polysubstance abuse  Patient was stable overnight, his systolic BP did dip into the 968Land diastolic dipped into the 427N Was given 500cc bolus of NS, remained asymptomatic. He is feeling better than yesterday but still has some pain, mostly in his back. Patient expressed that he would like to go home because he feels that he is better.   Objective: Temp:  [97.9 F (36.6 C)-98.6 F (37 C)] 98.6 F (37 C) (07/06 0524) Pulse Rate:  [71-85] 72 (07/06 0524) Resp:  [16-18] 18 (07/06 0524) BP: (93-113)/(46-67) 95/46 mmHg (07/06 0524) SpO2:  [95 %-99 %] 97 % (07/06 0524) Physical Exam: General: Lying in bed in NAD, sleeping. Non-toxic.   Cardiovascular:  RRR. II/VI systolic murmur noted best at the left upper sternal border. No rubs or gallops noted. No pitting edema noted Respiratory: No increased WOB. Clear to auscultation bilaterally. No wheezing or rhonchi Abdomen: +BS, soft, non-distended, non-tender. Unable to palpate spleen Extremities: no cyanosis, no edema  Laboratory:  Recent Labs Lab 08/18/14 0520 08/18/14 1615 08/19/14 0419   WBC 15.1* 11.3* 12.0*  HGB 6.5* 6.3* 8.5*  HCT 17.8* 17.3* 23.2*  PLT 179 189 226    Recent Labs Lab 08/17/14 2107 08/18/14 0520 08/19/14 0419  NA 135 137 139  K 4.3 4.6 4.2  CL 113* 115* 114*  CO2 14* 14* 18*  BUN 31* 25* 17  CREATININE 1.67* 1.45* 1.33*  CALCIUM 8.8* 8.7* 8.7*  PROT 7.5 6.8 6.5  BILITOT 2.6* 2.6* 2.5*  ALKPHOS 124 111 112  ALT 99* 85* 67*  AST 123* 96* 69*  GLUCOSE 146* 101* 92    Imaging/Diagnostic Tests: No results found.   Carlyle Dolly, MD 08/19/2014, 12:02 PM PGY-1, [facility] Intern pager: [phone], text pages welcome

## 2014-08-19 NOTE — Clinical Social Work Note (Signed)
CSW spoke with patient regarding substance abuse issues.  Patient stated he does not feel like he needs any help, patient was offered list of substance abuse facilities and narcotics anonymous meetings.  Patient stated he does want the list but he will take it anyway.  Patient stated he is feeling better and hopeful that he will be ready to discharge soon.  CSW to sign off please reconsult if other social work needs arise.  Jones Broom. [place], MSW, [place] 08/19/2014 12:04 PM

## 2014-08-20 LAB — CBC
HCT: 24.6 % — ABNORMAL LOW (ref 39.0–52.0)
Hemoglobin: 9 g/dL — ABNORMAL LOW (ref 13.0–17.0)
MCH: 40.4 pg — ABNORMAL HIGH (ref 26.0–34.0)
MCHC: 36.6 g/dL — AB (ref 30.0–36.0)
MCV: 110.3 fL — ABNORMAL HIGH (ref 78.0–100.0)
Platelets: 252 10*3/uL (ref 150–400)
RBC: 2.23 MIL/uL — AB (ref 4.22–5.81)
WBC: 12.3 10*3/uL — ABNORMAL HIGH (ref 4.0–10.5)

## 2014-08-20 NOTE — Progress Notes (Signed)
Family Medicine Teaching Service Daily Progress Note Intern Pager: [phone]  Patient name: [name] [name]. Medical record number: [id number] Date of birth: [birth year] Age: 34 y.o. Gender: male  Primary Care Provider: Tawanna Sat, MD Consultants: none Code Status: full per discussion on admission  Pt Overview and Major Events to Date:  07/05 Admit to telemetry  Assessment and Plan: [name] [name]. is a 34 y.o. male presenting with pain in his arms, legs, and back for 3 days consistent with his typical sickle cell pain crisis. PMH is significant for sickle cell anemia, HTN, CKD, polysubstance abuse.   Sickle cell pain crisis: Patient with a h/o hemoglobin SS sickle cell disease. Pain in his arms, legs, and back typical of his typical crisis per his report. He has been taking Percocet at home without improvement. From previous notes does not appears like he is receiving narcotic Rx from our clinic due to h/o cocaine abuse and concerns of selling opiates (as he was negative for opiates) on UDS. Patient becomes agitated when I ask who has prescribed these. Patient asymptomatic currently. He did endorse occasional chest pain. CXR with mild increased pulmonary interstitium but stable, no fevers SOB, or cough.  - Place in observation, attending Dr. Erin Hearing - DC IV Dilaudid, transition quickly to orals - Sennok-S BID, MiraLax PRN - continue to monitor O2 saturations, supplement O2 as needed - Consider another transfusion if less than 7 and symptomatic  - K pads for pain  - continue home hydroxyurea  CKD, stage 3: baseline SCr appears to be around 1.7 (however was 1.45 on last check). Toradol was given, will need to monitor renal function.  - Holding home enalapril  - avoid nephrotoxic agents - renally dose medications  Transaminitis: AST and ALT have intermittently been elevated. Alk phos normal making this less likely cholestatic in nature (less likely retained stone in  the CBD given pt is s/p cholecystectomy). Currently in a >2:1 ratio of AST:ALT consistent with alcohol. Viral hepatitis panel negative in 2015. - Given his time in the ED, I do not feel ethanol level would be beneficial  - GGT 33, AST 69, ALT 97 on 7/6  Polysubstance abuse: Patient with long h/o substance abuse.  - UDS with +THC, cocaine, and opiates - CIWA protocol - CSW consult  FEN/GI: regular diet Prophylaxis: SQ Lovenox  Disposition: home once we can transition to PO pain medication  Subjective:  [name] [name]. is a 34 y.o. male presenting with pain in his arms, legs, and back for 3 days consistent with his typical sickle cell pain crisis. PMH is significant for sickle cell anemia, HTN, CKD, polysubstance abuse  Patient was stable overnight. This morning he did not speak with me while he was laying in bed, will need to follow up with him later in the day. He did not have any painful events overnight per nursing. He has apparently refused PO pain medication because he said "it doesn't work" and was only taking PRN IV pain medication. His hbg has improved from 8.5 to 9. Once he can transition to oral pain medication he is ready to go home.   Objective: Temp:  [97.9 F (36.6 C)-98.6 F (37 C)] 98.5 F (36.9 C) (07/07 0500) Pulse Rate:  [62-78] 62 (07/07 0500) Resp:  [16-18] 16 (07/07 0500) BP: (96-116)/(56-76) 96/56 mmHg (07/07 0500) SpO2:  [98 %-100 %] 100 % (07/07 0500) Physical Exam: General: In NAD, laying in bed Cardiovascular: RRR. II/VI systolic murmur noted  best at the left upper sternal border. No rubs or gallops noted. No pitting edema noted Respiratory: No increased WOB. Clear to auscultation bilaterally. No wheezing or rhonchi Abdomen: +BS, soft, non-distended, non-tender. Unable to palpate spleen Extremities: no cyanosis, no edema  Laboratory:  Recent Labs Lab 08/18/14 1615 08/19/14 0419 08/20/14 0533  WBC 11.3* 12.0* 12.3*  HGB 6.3* 8.5* 9.0*   HCT 17.3* 23.2* 24.6*  PLT 189 226 252    Recent Labs Lab 08/17/14 2107 08/18/14 0520 08/19/14 0419  NA 135 137 139  K 4.3 4.6 4.2  CL 113* 115* 114*  CO2 14* 14* 18*  BUN 31* 25* 17  CREATININE 1.67* 1.45* 1.33*  CALCIUM 8.8* 8.7* 8.7*  PROT 7.5 6.8 6.5  BILITOT 2.6* 2.6* 2.5*  ALKPHOS 124 111 112  ALT 99* 85* 67*  AST 123* 96* 69*  GLUCOSE 146* 101* 92     Imaging/Diagnostic Tests: none  Carlyle Dolly, MD 08/20/2014, 12:06 PM PGY-1, [place] Intern pager: [phone], text pages welcome

## 2014-08-20 NOTE — Care Management (Signed)
Important Message  Patient Details  Name: [name] [name]. MRN: [id number] Date of Birth: April 30, 1980   Medicare Important Message Given:  Yes-second notification given    Delorse Lek 08/20/2014, 2:42 PM

## 2014-09-02 NOTE — Discharge Summary (Signed)
[facility] Discharge Summary  Patient name: [name] [name]. Medical record number: [id number] Date of birth: 10-09-1980 Age: 34 y.o. Gender: male Date of Admission: 08/17/2014  Date of Discharge: 08/21/2014 Admitting Physician: Lind Covert, MD  Primary Care Provider: Tawanna Sat, MD Consultants: none  Indication for Hospitalization: sickle cell crisis  Discharge Diagnoses/Problem List:  Patient Active Problem List   Diagnosis Date Noted  . Hb-SS disease with crisis   . Sickle cell anemia 08/18/2014  . Nausea with vomiting   . Polysubstance abuse 03/22/2014  . Chest pain   . Sickle cell crisis 03/19/2014  . Vaso-occlusive sickle cell crisis 05/09/2013  . Cocaine abuse 09/26/2012  . Acute-on-chronic kidney injury 09/26/2012  . Systolic murmur [id number]  . Migraine 11/26/2009  . CHRONIC KIDNEY DISEASE STAGE II (MILD) 03/06/2009  . Sickle cell disease, type SS. No outpatient prescriptions from [facility] due to cocaine positive, opiate negative on high doses of given narcotics 06/18/2008  . TOBACCO ABUSE 05/22/2007     Disposition: home  Discharge Condition: stable   Discharge Exam:  General: In NAD, laying in bed Cardiovascular: RRR. II/VI systolic murmur noted best at the left upper sternal border. No rubs or gallops noted. No pitting edema noted Respiratory: No increased WOB. Clear to auscultation bilaterally. No wheezing or rhonchi Abdomen: +BS, soft, non-distended, non-tender. Unable to palpate spleen Extremities: no cyanosis, no edema  Brief Hospital Course:   Sickle cell pain crisis:  Patient with a h/o hemoglobin SS sickle cell disease and presented with leg and back pain following a trip to the beach. In the ED, he was given Dilaudid 1mg  x 4 doses and 1 L NS bolus x 2. CXR did not reveal any acute changes. Per RN staff, the patient was noted to have some paranoid thinking, becoming frustrated when she asked him for a urine  sample and discussed she was concerned about his somnolence.  Hemoglobin was low at 6.5. Retic up to 8.7%. Was given Oxycodone 5mg  and then later placed on D5-1/2NS @ 3/4 MIVF and Dilaudid 1mg  q2hrs PRN pain. Was given 1 U of blood and hgb improved to 9 before being discharged. Was continued on hydroxyurea and eventually transitioned to PO Dilaudid. Pain mostly resolved.  CKD, stage 3:  Cr was 1.45. Held at home enalapril and renally dosed medications. Gave IVF. Avoided nephrotoxic agents.  baseline SCr appears to be around 1.7 (however was 1.45 on last check).   Transaminitis:  AST and ALT were intermittently elevated. Was a >2:1 ratio of AST:ALT consistent with alcohol. Viral hepatitis panel negative in 2015.  Polysubstance abuse:  Patient with long h/o substance abuse. UDS with +THC, cocaine, and opiates. CIWA protocol was initiated as well as a CSW consult  Issues for Follow Up:  1. Sickle Cell Anemia 2. Polysubstance abuse  Significant Procedures: none  Significant Labs and Imaging:  Recent Labs Lab 08/18/14 1615 08/19/14 0419 08/20/14 0533  WBC 11.3* 12.0* 12.3*  HGB 6.3* 8.5* 9.0*  HCT 17.3* 23.2* 24.6*  PLT 189 226 252         Lab 08/17/14 2107 08/18/14 0520 08/19/14 0419  NA 135 137 139  K 4.3 4.6 4.2  CL 113* 115* 114*  CO2 14* 14* 18*  BUN 31* 25* 17  CREATININE 1.67* 1.45* 1.33*  CALCIUM 8.8* 8.7* 8.7*  PROT 7.5 6.8 6.5  BILITOT 2.6* 2.6* 2.5*  ALKPHOS 124 111 112  ALT 99* 85* 67*  AST 123* 96* 69*  GLUCOSE 146* 101* 92            Results/Tests Pending at Time of Discharge: none  Discharge Medications:    Medication List    TAKE these medications        acetaminophen 325 MG tablet  Commonly known as:  TYLENOL  Take 650 mg by mouth every 6 (six) hours as needed for mild pain.     diclofenac 75 MG EC tablet  Commonly known as:  VOLTAREN  Take 1 tablet (75 mg  total) by mouth 2 (two) times daily.     enalapril 5 MG tablet  Commonly known as:  VASOTEC  Take 1 tablet (5 mg total) by mouth daily.     hydroxyurea 500 MG capsule  Commonly known as:  HYDREA  Take 3 capsules (1,500 mg total) by mouth daily.     nortriptyline 25 MG capsule  Commonly known as:  PAMELOR  Take 1 capsule (25 mg total) by mouth at bedtime.     Oxycodone HCl 10 MG Tabs  Take 1 tablet (10 mg total) by mouth every 4 (four) hours as needed.     oxyCODONE-acetaminophen 10-325 MG per tablet  Commonly known as:  PERCOCET  Take 1 tablet by mouth every 4 (four) hours as needed for pain.     traMADol 50 MG tablet  Commonly known as:  ULTRAM  Take 1 tablet (50 mg total) by mouth every 8 (eight) hours as needed.        Discharge Instructions: Please refer to Patient Instructions section of EMR for full details.  Patient was counseled important signs and symptoms that should prompt return to medical care, changes in medications, dietary instructions, activity restrictions, and follow up appointments.   Follow-Up Appointments:   Carlyle Dolly, MD 09/02/2014, 5:30 PM PGY-1, [place]

## 2014-10-14 IMAGING — CR DG ELBOW COMPLETE 3+V*L*
4 series · 4 of 4 positions shown · non-contrast
Comparison: None.

CLINICAL DATA: Sickle cell with posterior elbow swelling.

EXAM:
LEFT ELBOW - COMPLETE 3+ VIEW

[x elbow joint ap left]
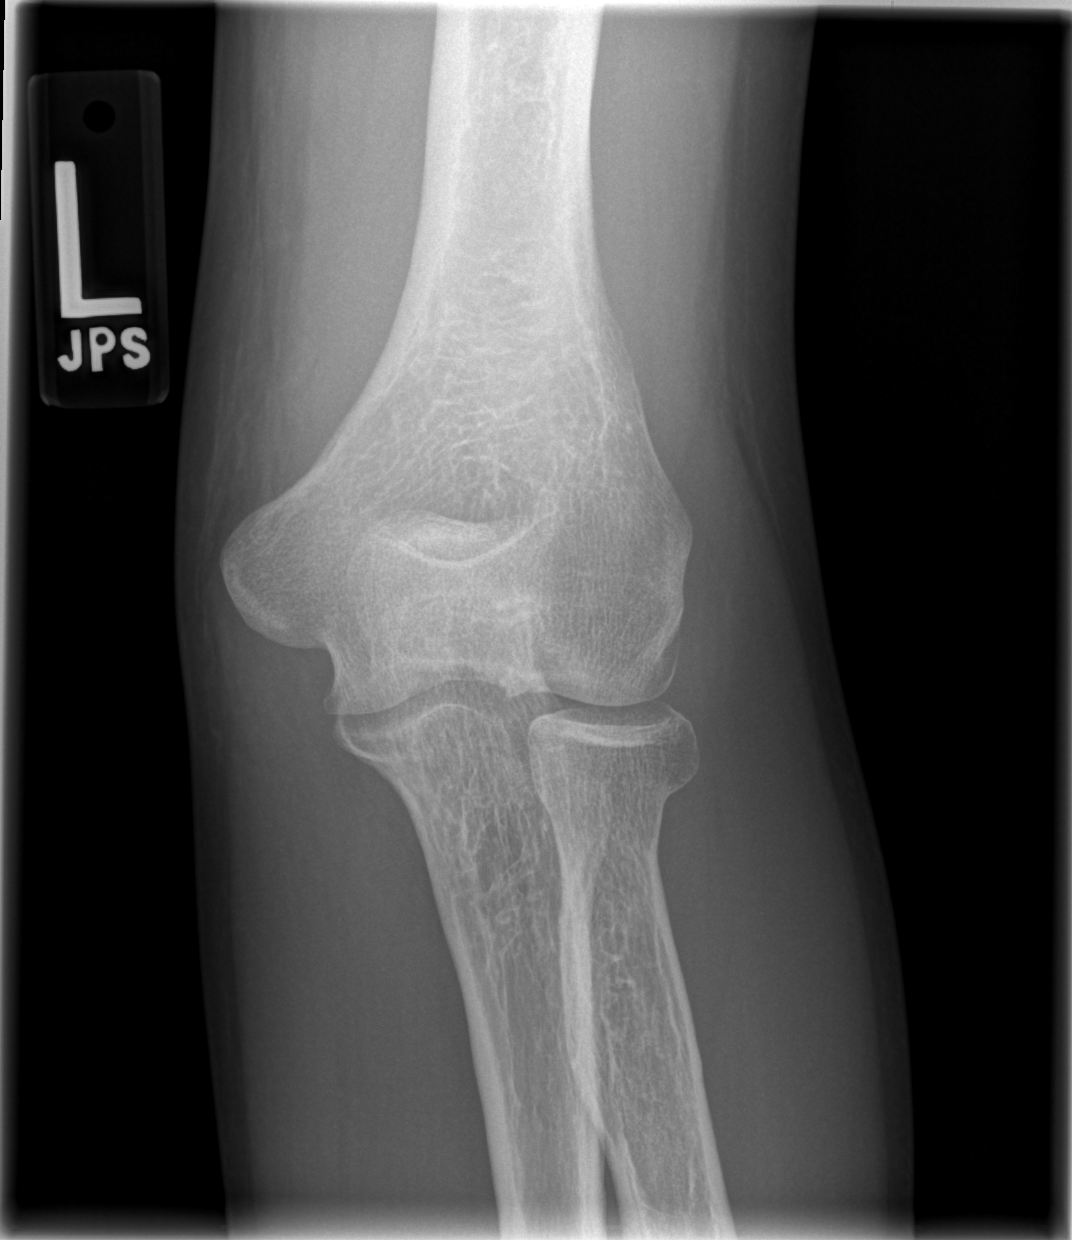

[x elbow joint obl. left (1 of 2)]
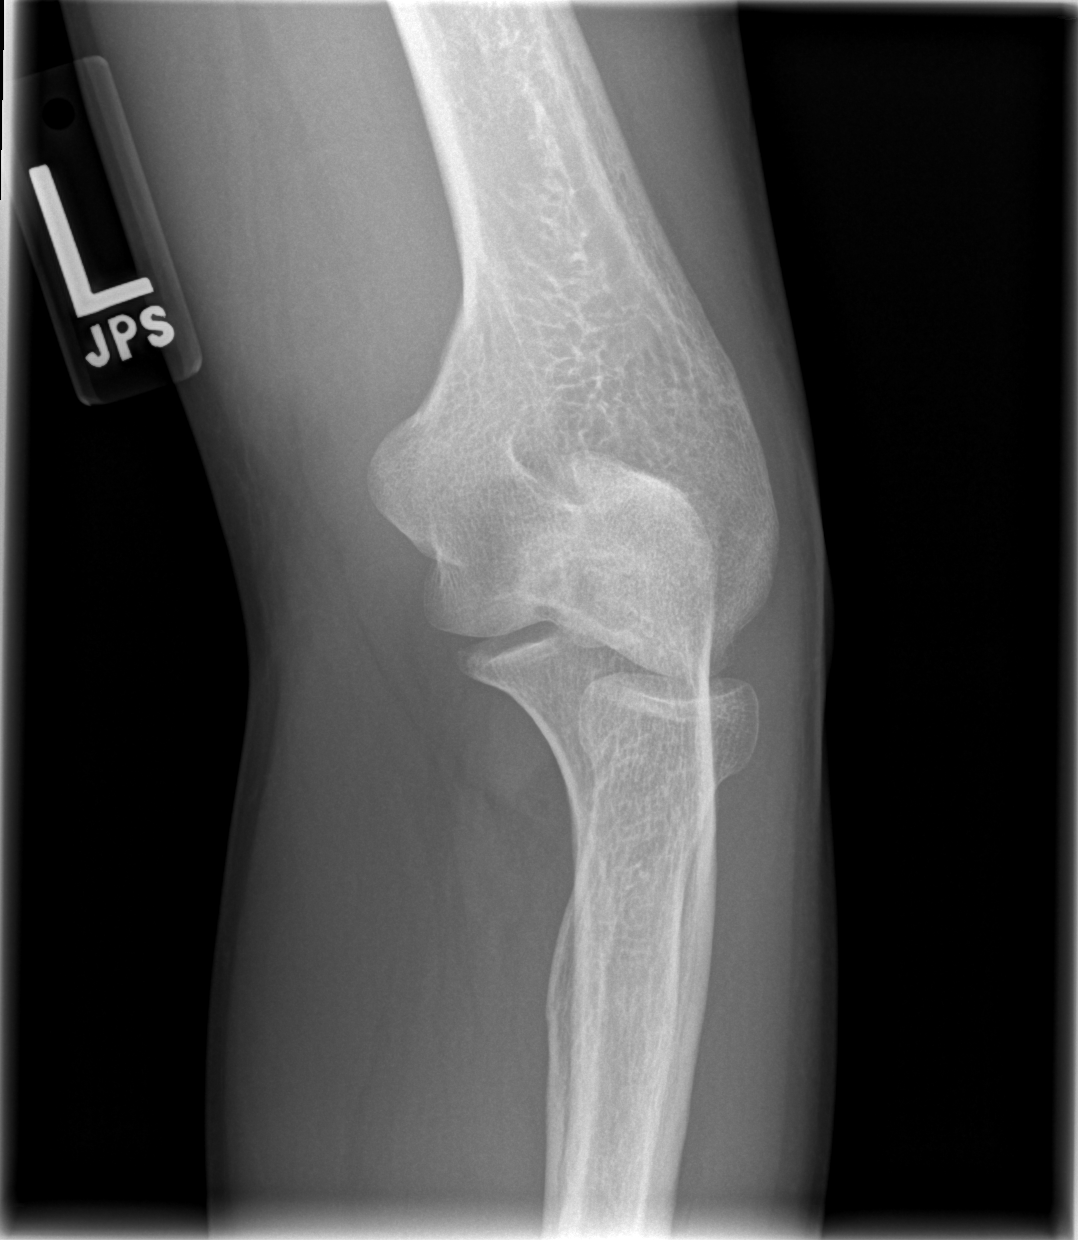

[x elbow joint obl. left (2 of 2)]
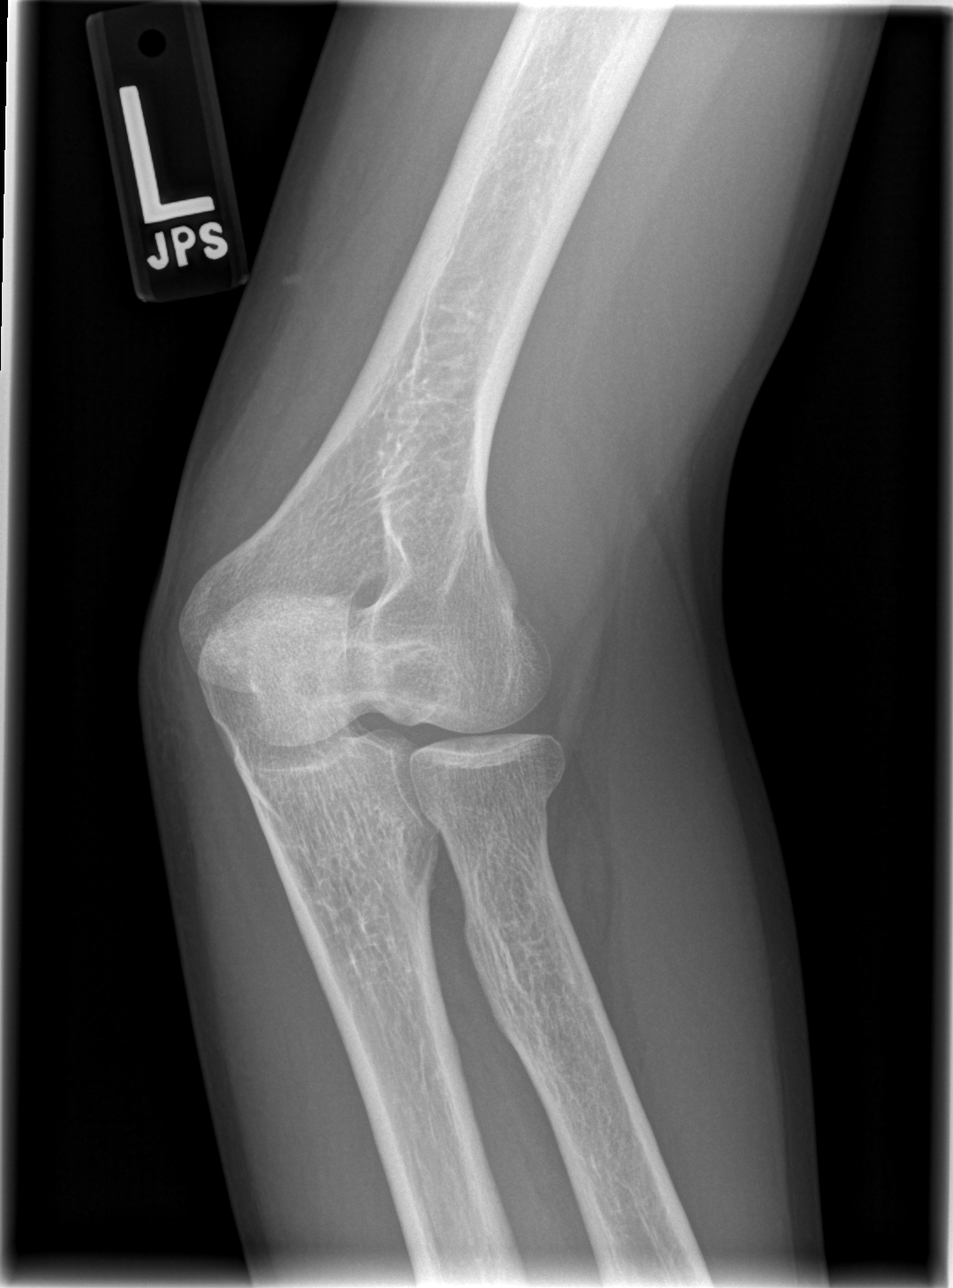

[x elbow joint lat left]
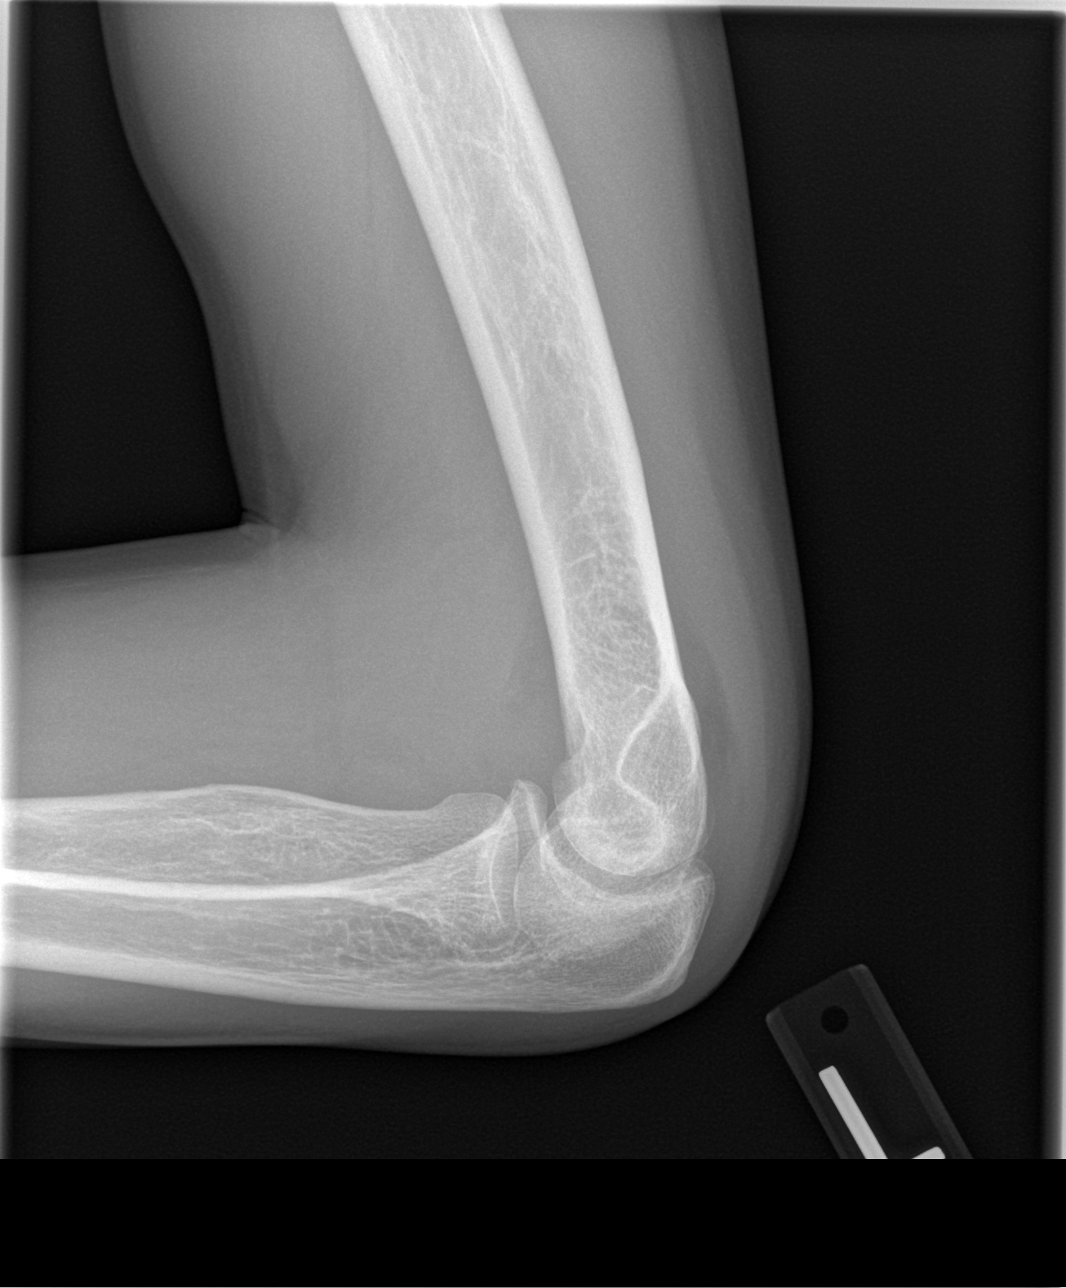

[4 of 4 positions shown; findings below may reference images not displayed]

FINDINGS: There is a posterior fat pad sign and findings are compatible with
an elbow joint effusion. The elbow is located and there is no
evidence for a displaced fracture.
IMPRESSION: Elbow joint effusion. There is no evidence for a displaced fracture.
If there is concern for an underlying bone injury, consider
stabilization and follow up imaging.

## 2014-10-14 IMAGING — CR DG CHEST 2V
2 series · 2 of 2 positions shown · non-contrast
Comparison: DG CHEST 2 VIEW dated 01/25/2013

CLINICAL DATA: Sickle cell and crisis.

EXAM:
CHEST  2 VIEW

[w chest pa]
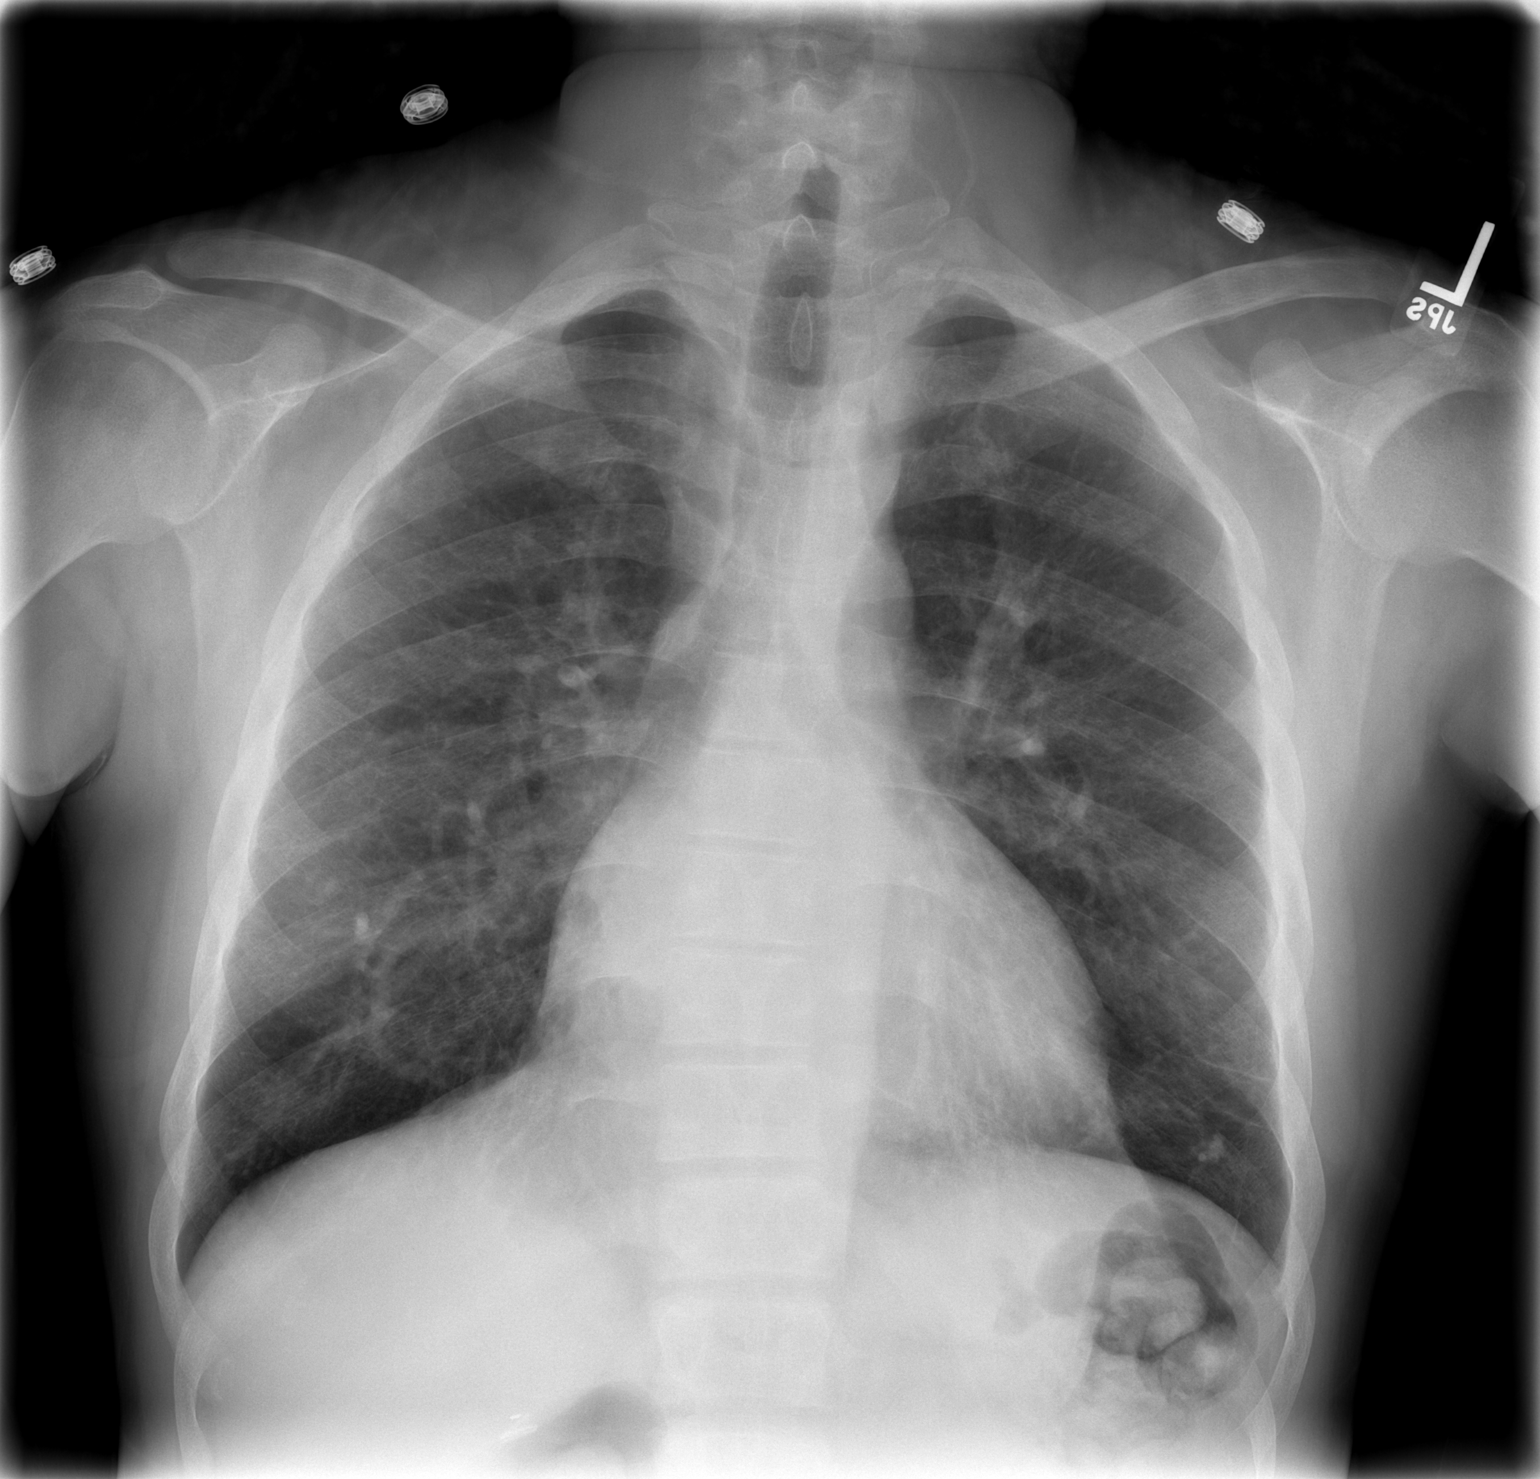

[w chest lat]
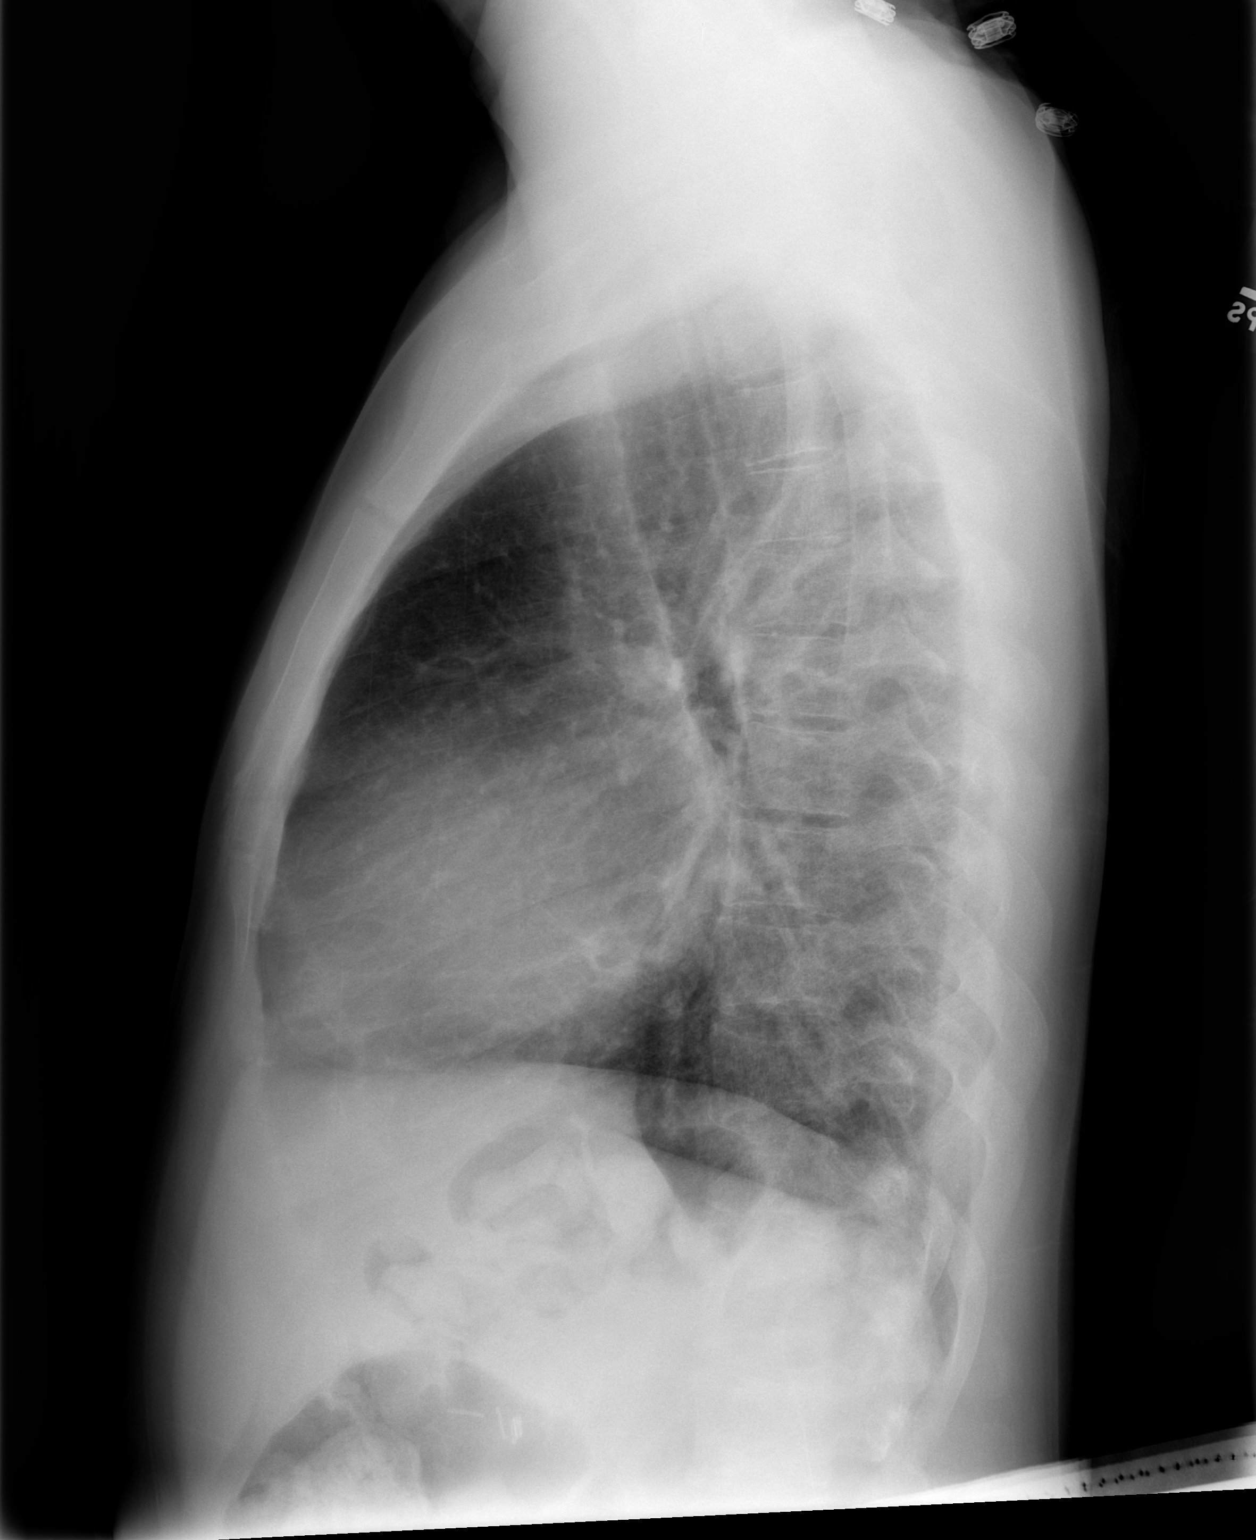

[2 of 2 positions shown; findings below may reference images not displayed]

FINDINGS: Again noted are prominent lung markings and suspect small
calcifications at the left lung base. These findings are unchanged.
There is no focal airspace disease or edema. Heart size is normal.
Negative for pleural effusions. No acute bone abnormality.
IMPRESSION: Chronic lung markings or peribronchial changes.  No acute findings.

## 2014-11-20 ENCOUNTER — Encounter (HOSPITAL_COMMUNITY): Payer: Self-pay | Admitting: Emergency Medicine

## 2014-11-20 ENCOUNTER — Emergency Department (HOSPITAL_COMMUNITY): Payer: Medicare Other

## 2014-11-20 ENCOUNTER — Inpatient Hospital Stay (HOSPITAL_COMMUNITY)
Admission: EM | Admit: 2014-11-20 | Discharge: 2014-11-24 | DRG: 811 | Disposition: A | Discharge status: Home / Self Care | Payer: Medicare Other | Attending: Family Medicine | Admitting: Family Medicine

## 2014-11-20 DIAGNOSIS — F1490 Cocaine use, unspecified, uncomplicated: Secondary | ICD-10-CM | POA: Insufficient documentation

## 2014-11-20 DIAGNOSIS — K219 Gastro-esophageal reflux disease without esophagitis: Secondary | ICD-10-CM | POA: Diagnosis present

## 2014-11-20 DIAGNOSIS — R079 Chest pain, unspecified: Secondary | ICD-10-CM | POA: Diagnosis not present

## 2014-11-20 DIAGNOSIS — F1210 Cannabis abuse, uncomplicated: Secondary | ICD-10-CM | POA: Diagnosis present

## 2014-11-20 DIAGNOSIS — D5701 Hb-SS disease with acute chest syndrome: Principal | ICD-10-CM | POA: Diagnosis present

## 2014-11-20 DIAGNOSIS — D5700 Hb-SS disease with crisis, unspecified: Secondary | ICD-10-CM

## 2014-11-20 DIAGNOSIS — N179 Acute kidney failure, unspecified: Secondary | ICD-10-CM | POA: Diagnosis present

## 2014-11-20 DIAGNOSIS — N183 Chronic kidney disease, stage 3 (moderate): Secondary | ICD-10-CM | POA: Diagnosis present

## 2014-11-20 DIAGNOSIS — Z803 Family history of malignant neoplasm of breast: Secondary | ICD-10-CM

## 2014-11-20 DIAGNOSIS — F1410 Cocaine abuse, uncomplicated: Secondary | ICD-10-CM | POA: Diagnosis present

## 2014-11-20 DIAGNOSIS — F17210 Nicotine dependence, cigarettes, uncomplicated: Secondary | ICD-10-CM | POA: Diagnosis present

## 2014-11-20 DIAGNOSIS — J189 Pneumonia, unspecified organism: Secondary | ICD-10-CM | POA: Diagnosis present

## 2014-11-20 LAB — CBC WITH DIFFERENTIAL/PLATELET
BASOS ABS: 0 10*3/uL (ref 0.0–0.1)
BASOS PCT: 0 %
EOS PCT: 1 %
Eosinophils Absolute: 0.2 10*3/uL (ref 0.0–0.7)
HEMATOCRIT: 22.2 % — AB (ref 39.0–52.0)
HEMOGLOBIN: 8 g/dL — AB (ref 13.0–17.0)
LYMPHS PCT: 52 %
Lymphs Abs: 8.9 10*3/uL — ABNORMAL HIGH (ref 0.7–4.0)
MCH: 42.8 pg — ABNORMAL HIGH (ref 26.0–34.0)
MCHC: 36 g/dL (ref 30.0–36.0)
MCV: 118.7 fL — AB (ref 78.0–100.0)
MONOS PCT: 9 %
Monocytes Absolute: 1.6 10*3/uL — ABNORMAL HIGH (ref 0.1–1.0)
NEUTROS ABS: 6.6 10*3/uL (ref 1.7–7.7)
Neutrophils Relative %: 38 %
Platelets: 230 10*3/uL (ref 150–400)
RBC: 1.87 MIL/uL — ABNORMAL LOW (ref 4.22–5.81)
RDW: 20 % — ABNORMAL HIGH (ref 11.5–15.5)
WBC: 17.3 10*3/uL — ABNORMAL HIGH (ref 4.0–10.5)

## 2014-11-20 LAB — COMPREHENSIVE METABOLIC PANEL
ALBUMIN: 4.2 g/dL (ref 3.5–5.0)
ALT: 26 U/L (ref 17–63)
AST: 45 U/L — ABNORMAL HIGH (ref 15–41)
Alkaline Phosphatase: 146 U/L — ABNORMAL HIGH (ref 38–126)
Anion gap: 9 (ref 5–15)
BILIRUBIN TOTAL: 2.4 mg/dL — AB (ref 0.3–1.2)
BUN: 29 mg/dL — AB (ref 6–20)
CHLORIDE: 109 mmol/L (ref 101–111)
CO2: 21 mmol/L — ABNORMAL LOW (ref 22–32)
CREATININE: 1.84 mg/dL — AB (ref 0.61–1.24)
Calcium: 8.7 mg/dL — ABNORMAL LOW (ref 8.9–10.3)
GFR calc Af Amer: 55 mL/min — ABNORMAL LOW (ref >60)
GFR calc non Af Amer: 47 mL/min — ABNORMAL LOW (ref >60)
GLUCOSE: 106 mg/dL — AB (ref 65–99)
POTASSIUM: 4.2 mmol/L (ref 3.5–5.1)
Sodium: 139 mmol/L (ref 135–145)
TOTAL PROTEIN: 7 g/dL (ref 6.5–8.1)

## 2014-11-20 LAB — RETICULOCYTES
RBC.: 1.87 MIL/uL — AB (ref 4.22–5.81)
Retic Count, Absolute: 198.2 10*3/uL — ABNORMAL HIGH (ref 19.0–186.0)
Retic Ct Pct: 10.6 % — ABNORMAL HIGH (ref 0.4–3.1)

## 2014-11-20 LAB — I-STAT TROPONIN, ED: Troponin i, poc: 0 ng/mL (ref 0.00–0.08)

## 2014-11-20 MED ORDER — HYDROMORPHONE HCL 1 MG/ML IJ SOLN: 0.0313 mg/kg | INTRAMUSCULAR | Status: AC

## 2014-11-20 MED ORDER — HYDROMORPHONE HCL 1 MG/ML IJ SOLN
0.0313 mg/kg | INTRAMUSCULAR | Status: AC
  Administered 2014-11-20: 1.85 mg via INTRAVENOUS
  Filled 2014-11-20: qty 2

## 2014-11-20 MED ORDER — PIPERACILLIN-TAZOBACTAM 3.375 G IVPB 30 MIN: 3.3750 g | Freq: Once | INTRAVENOUS | Status: DC

## 2014-11-20 MED ORDER — SODIUM CHLORIDE 0.9 % IV BOLUS (SEPSIS)
1000.0000 mL | Freq: Once | INTRAVENOUS | Status: AC
  Administered 2014-11-21: 1000 mL via INTRAVENOUS

## 2014-11-20 MED ORDER — HYDROMORPHONE HCL 1 MG/ML IJ SOLN
0.0250 mg/kg | INTRAMUSCULAR | Status: AC
  Filled 2014-11-20: qty 2

## 2014-11-20 MED ORDER — HYDROMORPHONE HCL 1 MG/ML IJ SOLN
0.0250 mg/kg | INTRAMUSCULAR | Status: AC
  Administered 2014-11-20: 1.5 mg via INTRAVENOUS
  Filled 2014-11-20: qty 2

## 2014-11-20 MED ORDER — PIPERACILLIN-TAZOBACTAM 4.5 G IVPB: 4.5000 g | Freq: Once | INTRAVENOUS | Status: DC

## 2014-11-20 MED ORDER — LEVOFLOXACIN IN D5W 750 MG/150ML IV SOLN
750.0000 mg | Freq: Once | INTRAVENOUS | Status: AC
  Administered 2014-11-21: 750 mg via INTRAVENOUS
  Filled 2014-11-20: qty 150

## 2014-11-20 MED ORDER — SODIUM CHLORIDE 0.45 % IV SOLN
INTRAVENOUS | Status: DC
  Administered 2014-11-20: 22:00:00 via INTRAVENOUS

## 2014-11-20 MED ORDER — SODIUM CHLORIDE 0.9 % IV BOLUS (SEPSIS): 1000.0000 mL | Freq: Once | INTRAVENOUS | Status: DC

## 2014-11-20 NOTE — ED Provider Notes (Signed)
CSN: [id number]     Arrival date & time 11/20/14  2100 History   First MD Initiated Contact with Patient 11/20/14 2122     Chief Complaint  Patient presents with  . Chest Pain  . Sickle Cell Pain Crisis     (Consider location/radiation/quality/duration/timing/severity/associated sxs/prior Treatment) Patient is a 34 y.o. male presenting with chest pain and sickle cell pain. The history is provided by the patient.  Chest Pain Pain location:  Substernal area Pain quality: pressure   Pain radiates to the back: no   Pain severity:  Moderate Progression:  Worsening Chronicity:  New Associated symptoms: shortness of breath   Associated symptoms: no fever, no nausea and not vomiting   Sickle Cell Pain Crisis Location:  Lower extremity, back and chest Severity:  Severe Onset quality:  Gradual Duration:  12 hours Similar to previous crisis episodes: yes   Timing:  Constant Progression:  Worsening Chronicity:  Recurrent History of pulmonary emboli: no   Ineffective treatments:  None tried Associated symptoms: chest pain and shortness of breath   Associated symptoms: no fever, no nausea and no vomiting   Risk factors: frequent pain crises     Past Medical History  Diagnosis Date  . Sickle cell anemia (HCC)   . Sickle cell crisis ([place]) 09/25/2012  . Bacterial pneumonia ~ 2012    "caught it here in the hospital" (09/25/2012)  . History of blood transfusion     "always related to sickle cell crisis" (09/25/2012)  . GERD (gastroesophageal reflux disease)     "after I eat alot of spicey foods" (09/25/2012)  . Migraines     "take RX qd to prevent them" (09/25/2012)  . Cyst of brain     "2 really small ones in the back of my head; inside; saw them w/MRI" (09/25/2012)  . Chronic kidney disease     "from my sickle cell" (09/25/2012)   Past Surgical History  Procedure Laterality Date  . Cholecystectomy  ~ 2012   Family History  Problem Relation Age of Onset  . Breast cancer Mother     Social History  Substance Use Topics  . Smoking status: Current Some Day Smoker    Types: Cigarettes  . Smokeless tobacco: Never Used     Comment: 09/25/2012 "I don't buy cigarettes; bum one from friends q now and then"  . Alcohol Use: 0.0 oz/week    0 Standard drinks or equivalent per week     Comment: occasionally    Review of Systems  Constitutional: Negative for fever and chills.  Respiratory: Positive for shortness of breath.   Cardiovascular: Positive for chest pain.  Gastrointestinal: Negative for nausea and vomiting.  All other systems reviewed and are negative.     Allergies  Morphine and related  Home Medications   Prior to Admission medications   Medication Sig Start Date End Date Taking? Authorizing Provider  enalapril (VASOTEC) 5 MG tablet Take 1 tablet (5 mg total) by mouth daily. 05/13/14  Yes Leone Brand, MD  hydroxyurea (HYDREA) 500 MG capsule Take 3 capsules (1,500 mg total) by mouth daily. 09/22/13  Yes Donnamae Jude, MD  nortriptyline (PAMELOR) 25 MG capsule Take 1 capsule (25 mg total) by mouth at bedtime. 09/22/13  Yes Donnamae Jude, MD  diclofenac (VOLTAREN) 75 MG EC tablet Take 1 tablet (75 mg total) by mouth 2 (two) times daily. Patient not taking: Reported on 02/23/2014 09/22/13   Donnamae Jude, MD  Oxycodone HCl 10 MG  TABS Take 1 tablet (10 mg total) by mouth every 4 (four) hours as needed. Patient not taking: Reported on 11/20/2014 03/20/14   Nolon Rod, DO  traMADol (ULTRAM) 50 MG tablet Take 1 tablet (50 mg total) by mouth every 8 (eight) hours as needed. Patient not taking: Reported on 02/23/2014 09/22/13   Donnamae Jude, MD   BP 115/75 mmHg  Pulse 106  Temp(Src) 98.7 F (37.1 C) (Oral)  Resp 20  Wt 130 lb (58.968 kg)  SpO2 96% Physical Exam  Constitutional: He is oriented to person, place, and time. He appears well-developed and well-nourished. He appears distressed.  HENT:  Head: Normocephalic.  Eyes: Conjunctivae are normal.  Neck:  Neck supple.  Cardiovascular: Normal heart sounds and intact distal pulses.   Pulmonary/Chest: Effort normal and breath sounds normal.  Abdominal: Soft.  Musculoskeletal: He exhibits no edema.  Generalized lower extremity and back pain, consistent with prior sickle cell exacerbation  Lymphadenopathy:    He has no cervical adenopathy.  Neurological: He is alert and oriented to person, place, and time.  Skin: Skin is warm and dry.  Psychiatric: He has a normal mood and affect.  Nursing note and vitals reviewed.   ED Course  Procedures (including critical care time) Labs Review Labs Reviewed  CBC WITH DIFFERENTIAL/PLATELET - Abnormal; Notable for the following:    WBC 17.3 (*)    RBC 1.87 (*)    Hemoglobin 8.0 (*)    HCT 22.2 (*)    MCV 118.7 (*)    MCH 42.8 (*)    RDW 20.0 (*)    Lymphs Abs 8.9 (*)    Monocytes Absolute 1.6 (*)    All other components within normal limits  RETICULOCYTES - Abnormal; Notable for the following:    Retic Ct Pct 10.6 (*)    RBC. 1.87 (*)    Retic Count, Manual 198.2 (*)    All other components within normal limits  COMPREHENSIVE METABOLIC PANEL - Abnormal; Notable for the following:    CO2 21 (*)    Glucose, Bld 106 (*)    BUN 29 (*)    Creatinine, Ser 1.84 (*)    Calcium 8.7 (*)    AST 45 (*)    Alkaline Phosphatase 146 (*)    Total Bilirubin 2.4 (*)    GFR calc non Af Amer 47 (*)    GFR calc Af Amer 55 (*)    All other components within normal limits  URINALYSIS, ROUTINE W REFLEX MICROSCOPIC (NOT AT [facility])  Randolm Idol, ED    Imaging Review Dg Chest Port 1 View  11/20/2014   CLINICAL DATA:  Chest pain and low back pain, onset this afternoon. Dyspnea.  EXAM: PORTABLE CHEST 1 VIEW  COMPARISON:  08/17/2014  FINDINGS: There is mild patchy opacity in the left base which could represent atelectasis or early infectious infiltrate. No large effusions are evident. Right lung is clear. Pulmonary vasculature is normal.  IMPRESSION: Patchy left  base opacity, atelectasis versus early infectious infiltrate.   Electronically Signed   By: Andreas Newport M.D.   On: 11/20/2014 21:51   I have personally reviewed and evaluated these images and lab results as part of my medical decision-making.   EKG Interpretation None     Patient discussed with and seen by Dr. Ralene Bathe. Lab and radiology results reviewed. Renal insufficiency--IV fluid bolus given. Chest xray suspicious for infiltrate. Antibiotics initiated. Oxygen saturations > 90%. Aggressive pain management. Admission to family practice.  MDM   Final diagnoses:  None   Sickle cell exacerbation.     Etta Quill, NP 11/21/14 0121  Quintella Reichert, MD 11/21/14 1235

## 2014-11-20 NOTE — ED Notes (Signed)
The patient is unable to give an urine specimen at this time. The tech has reported to the RN in charge. 

## 2014-11-20 NOTE — ED Notes (Signed)
Pt. reports pain across his chest , low back and legs pain onset this afternoon with emesis and SOB . Denies fever / no cough .

## 2014-11-21 DIAGNOSIS — D5700 Hb-SS disease with crisis, unspecified: Secondary | ICD-10-CM

## 2014-11-21 DIAGNOSIS — Z803 Family history of malignant neoplasm of breast: Secondary | ICD-10-CM | POA: Diagnosis not present

## 2014-11-21 DIAGNOSIS — N179 Acute kidney failure, unspecified: Secondary | ICD-10-CM | POA: Diagnosis not present

## 2014-11-21 DIAGNOSIS — J189 Pneumonia, unspecified organism: Secondary | ICD-10-CM | POA: Diagnosis present

## 2014-11-21 DIAGNOSIS — R079 Chest pain, unspecified: Secondary | ICD-10-CM | POA: Diagnosis present

## 2014-11-21 DIAGNOSIS — K219 Gastro-esophageal reflux disease without esophagitis: Secondary | ICD-10-CM | POA: Diagnosis present

## 2014-11-21 DIAGNOSIS — F1410 Cocaine abuse, uncomplicated: Secondary | ICD-10-CM | POA: Diagnosis not present

## 2014-11-21 DIAGNOSIS — N183 Chronic kidney disease, stage 3 (moderate): Secondary | ICD-10-CM | POA: Diagnosis present

## 2014-11-21 DIAGNOSIS — F1210 Cannabis abuse, uncomplicated: Secondary | ICD-10-CM | POA: Diagnosis present

## 2014-11-21 DIAGNOSIS — D5701 Hb-SS disease with acute chest syndrome: Secondary | ICD-10-CM | POA: Insufficient documentation

## 2014-11-21 DIAGNOSIS — F17210 Nicotine dependence, cigarettes, uncomplicated: Secondary | ICD-10-CM | POA: Diagnosis present

## 2014-11-21 LAB — PREPARE RBC (CROSSMATCH)

## 2014-11-21 LAB — BASIC METABOLIC PANEL
ANION GAP: 7 (ref 5–15)
BUN: 19 mg/dL (ref 6–20)
CALCIUM: 8.5 mg/dL — AB (ref 8.9–10.3)
CO2: 21 mmol/L — ABNORMAL LOW (ref 22–32)
Chloride: 112 mmol/L — ABNORMAL HIGH (ref 101–111)
Creatinine, Ser: 1.4 mg/dL — ABNORMAL HIGH (ref 0.61–1.24)
GFR calc Af Amer: >60 mL/min (ref >60)
GLUCOSE: 116 mg/dL — AB (ref 65–99)
Potassium: 4 mmol/L (ref 3.5–5.1)
Sodium: 140 mmol/L (ref 135–145)

## 2014-11-21 LAB — URINALYSIS, ROUTINE W REFLEX MICROSCOPIC
BILIRUBIN URINE: NEGATIVE
GLUCOSE, UA: 100 mg/dL — AB
Hgb urine dipstick: NEGATIVE
KETONES UR: NEGATIVE mg/dL
LEUKOCYTES UA: NEGATIVE
NITRITE: NEGATIVE
PROTEIN: NEGATIVE mg/dL
Specific Gravity, Urine: 1.01 (ref 1.005–1.030)
Urobilinogen, UA: 0.2 mg/dL (ref 0.0–1.0)
pH: 6 (ref 5.0–8.0)

## 2014-11-21 LAB — CBC
HEMATOCRIT: 20.9 % — AB (ref 39.0–52.0)
HEMOGLOBIN: 7.6 g/dL — AB (ref 13.0–17.0)
MCH: 43.9 pg — ABNORMAL HIGH (ref 26.0–34.0)
MCHC: 36.4 g/dL — ABNORMAL HIGH (ref 30.0–36.0)
MCV: 120.8 fL — ABNORMAL HIGH (ref 78.0–100.0)
Platelets: 183 10*3/uL (ref 150–400)
RBC: 1.73 MIL/uL — AB (ref 4.22–5.81)
RDW: 20.3 % — AB (ref 11.5–15.5)
WBC: 12.9 10*3/uL — AB (ref 4.0–10.5)

## 2014-11-21 LAB — RETICULOCYTES
RBC.: 1.73 MIL/uL — AB (ref 4.22–5.81)
RETIC COUNT ABSOLUTE: 178.2 10*3/uL (ref 19.0–186.0)
Retic Ct Pct: 10.3 % — ABNORMAL HIGH (ref 0.4–3.1)

## 2014-11-21 LAB — RAPID URINE DRUG SCREEN, HOSP PERFORMED
Amphetamines: NOT DETECTED
Barbiturates: NOT DETECTED
Benzodiazepines: NOT DETECTED
Cocaine: POSITIVE — AB
Opiates: POSITIVE — AB
Tetrahydrocannabinol: POSITIVE — AB

## 2014-11-21 MED ORDER — DIPHENHYDRAMINE HCL 50 MG/ML IJ SOLN: 12.5000 mg | Freq: Four times a day (QID) | INTRAMUSCULAR | Status: DC | PRN

## 2014-11-21 MED ORDER — HYDROXYZINE HCL 25 MG PO TABS: 25.0000 mg | ORAL_TABLET | ORAL | Status: DC | PRN

## 2014-11-21 MED ORDER — NON FORMULARY: 400.0000 mg | Freq: Every day | Status: DC

## 2014-11-21 MED ORDER — HYDROMORPHONE HCL 1 MG/ML IJ SOLN: 1.0000 mg | INTRAMUSCULAR | Status: DC | PRN

## 2014-11-21 MED ORDER — AMITRIPTYLINE HCL 25 MG PO TABS
25.0000 mg | ORAL_TABLET | Freq: Every day | ORAL | Status: DC
  Administered 2014-11-21 – 2014-11-23 (×3): 25 mg via ORAL
  Filled 2014-11-21 (×3): qty 1

## 2014-11-21 MED ORDER — ONDANSETRON HCL 4 MG/2ML IJ SOLN: 4.0000 mg | Freq: Four times a day (QID) | INTRAMUSCULAR | Status: DC | PRN

## 2014-11-21 MED ORDER — SODIUM CHLORIDE 0.9 % IJ SOLN: 9.0000 mL | INTRAMUSCULAR | Status: DC | PRN

## 2014-11-21 MED ORDER — PROMETHAZINE HCL 12.5 MG RE SUPP
12.5000 mg | RECTAL | Status: DC | PRN
  Filled 2014-11-21: qty 2

## 2014-11-21 MED ORDER — MORPHINE SULFATE (PF) 10 MG/ML IV SOLN
0.1500 mg/kg | INTRAVENOUS | Status: DC
  Filled 2014-11-21: qty 1

## 2014-11-21 MED ORDER — SODIUM CHLORIDE 0.9 % IV SOLN: Freq: Once | INTRAVENOUS | Status: DC

## 2014-11-21 MED ORDER — DIPHENHYDRAMINE HCL 12.5 MG/5ML PO ELIX: 12.5000 mg | ORAL_SOLUTION | Freq: Four times a day (QID) | ORAL | Status: DC | PRN

## 2014-11-21 MED ORDER — FOLIC ACID 1 MG PO TABS
1.0000 mg | ORAL_TABLET | Freq: Every day | ORAL | Status: DC
Start: 2014-11-21 — End: 2014-11-24
  Administered 2014-11-23 – 2014-11-24 (×2): 1 mg via ORAL
  Filled 2014-11-21 (×4): qty 1

## 2014-11-21 MED ORDER — HYDROMORPHONE HCL 1 MG/ML IJ SOLN
1.0000 mg | INTRAMUSCULAR | Status: DC | PRN
Start: 2014-11-21 — End: 2014-11-21
  Administered 2014-11-21: 1 mg via INTRAVENOUS
  Filled 2014-11-21: qty 1

## 2014-11-21 MED ORDER — NALOXONE HCL 0.4 MG/ML IJ SOLN: 0.4000 mg | INTRAMUSCULAR | Status: DC | PRN

## 2014-11-21 MED ORDER — HYDROMORPHONE 0.3 MG/ML IV SOLN
INTRAVENOUS | Status: DC
  Administered 2014-11-21: 3.4 mg via INTRAVENOUS
  Administered 2014-11-21: 10:00:00 via INTRAVENOUS

## 2014-11-21 MED ORDER — DIPHENHYDRAMINE HCL 12.5 MG/5ML PO ELIX
12.5000 mg | ORAL_SOLUTION | Freq: Four times a day (QID) | ORAL | Status: DC | PRN
  Filled 2014-11-21: qty 5

## 2014-11-21 MED ORDER — ACETAMINOPHEN 325 MG PO TABS
650.0000 mg | ORAL_TABLET | Freq: Four times a day (QID) | ORAL | Status: DC
  Administered 2014-11-21: 650 mg via ORAL
  Filled 2014-11-21 (×2): qty 2

## 2014-11-21 MED ORDER — MOXIFLOXACIN HCL 400 MG PO TABS
400.0000 mg | ORAL_TABLET | Freq: Every day | ORAL | Status: DC
  Administered 2014-11-21 – 2014-11-22 (×2): 400 mg via ORAL
  Filled 2014-11-21 (×3): qty 1

## 2014-11-21 MED ORDER — ACETAMINOPHEN 325 MG PO TABS
650.0000 mg | ORAL_TABLET | Freq: Four times a day (QID) | ORAL | Status: DC
  Administered 2014-11-21 – 2014-11-24 (×10): 650 mg via ORAL
  Filled 2014-11-21 (×11): qty 2

## 2014-11-21 MED ORDER — MORPHINE SULFATE (PF) 10 MG/ML IV SOLN: 0.1500 mg/kg | INTRAVENOUS | Status: DC

## 2014-11-21 MED ORDER — HYDROMORPHONE HCL 1 MG/ML IJ SOLN
2.0000 mg | Freq: Once | INTRAMUSCULAR | Status: AC
  Administered 2014-11-22: 2 mg via INTRAVENOUS
  Filled 2014-11-21: qty 2

## 2014-11-21 MED ORDER — HYDROMORPHONE HCL 1 MG/ML IJ SOLN
1.0000 mg | Freq: Once | INTRAMUSCULAR | Status: AC
  Administered 2014-11-21: 1 mg via INTRAVENOUS
  Filled 2014-11-21: qty 1

## 2014-11-21 MED ORDER — PROMETHAZINE HCL 25 MG PO TABS: 12.5000 mg | ORAL_TABLET | ORAL | Status: DC | PRN

## 2014-11-21 MED ORDER — MOXIFLOXACIN HCL IN NACL 400 MG/250ML IV SOLN
400.0000 mg | INTRAVENOUS | Status: DC
  Filled 2014-11-21: qty 250

## 2014-11-21 MED ORDER — PANTOPRAZOLE SODIUM 40 MG PO TBEC
40.0000 mg | DELAYED_RELEASE_TABLET | Freq: Every day | ORAL | Status: DC
  Administered 2014-11-23 – 2014-11-24 (×2): 40 mg via ORAL
  Filled 2014-11-21 (×4): qty 1

## 2014-11-21 MED ORDER — HYDROMORPHONE 0.3 MG/ML IV SOLN
INTRAVENOUS | Status: DC
  Administered 2014-11-21 (×2): via INTRAVENOUS
  Administered 2014-11-21: 2.4 mg via INTRAVENOUS
  Administered 2014-11-22: 0.3 mL via INTRAVENOUS
  Administered 2014-11-22: 05:00:00 via INTRAVENOUS
  Filled 2014-11-21 (×2): qty 25

## 2014-11-21 MED ORDER — LEVOFLOXACIN IN D5W 750 MG/150ML IV SOLN: 750.0000 mg | INTRAVENOUS | Status: DC

## 2014-11-21 MED ORDER — SENNOSIDES-DOCUSATE SODIUM 8.6-50 MG PO TABS
1.0000 | ORAL_TABLET | Freq: Two times a day (BID) | ORAL | Status: DC
  Filled 2014-11-21 (×6): qty 1

## 2014-11-21 MED ORDER — HYDROXYUREA 500 MG PO CAPS
1500.0000 mg | ORAL_CAPSULE | Freq: Every day | ORAL | Status: DC
  Administered 2014-11-21 – 2014-11-24 (×4): 1500 mg via ORAL
  Filled 2014-11-21 (×4): qty 3

## 2014-11-21 MED ORDER — POLYETHYLENE GLYCOL 3350 17 G PO PACK: 17.0000 g | PACK | Freq: Every day | ORAL | Status: DC | PRN

## 2014-11-21 MED ORDER — ENOXAPARIN SODIUM 40 MG/0.4ML ~~LOC~~ SOLN
40.0000 mg | SUBCUTANEOUS | Status: DC
  Administered 2014-11-21 – 2014-11-24 (×3): 40 mg via SUBCUTANEOUS
  Filled 2014-11-21 (×4): qty 0.4

## 2014-11-21 MED ORDER — DEXTROSE-NACL 5-0.45 % IV SOLN
INTRAVENOUS | Status: DC
  Administered 2014-11-21 – 2014-11-23 (×6): via INTRAVENOUS

## 2014-11-21 NOTE — Progress Notes (Signed)
FPTS Interim Progress Note  A/P:  Acute Chest Syndrome - Continue Avalox for acute chest  - Type and screen, transfuse 2 units of RBCs for a goal hgb of greater than 8.0   Patient refused blood draws or transfusions earlier today because he did not want anyone poking him anymore. Discussed with patient the importance and significants of the labs and the blood transfusions. At that time patient only had a single access site for PCA and fluids. Discussed using this site for transfusion if unable to get PIV, as patient refused a PIC line or Central line. Patient seemed amenable at the time to this. However, then refused labs draws for type and screen per nursing   Shailah Gibbins Cletis Media, MD 11/21/2014, 8:42 PM PGY-1, [place] Medicine Service pager [phone]

## 2014-11-21 NOTE — Progress Notes (Signed)
PT Cancellation Note  Patient Details Name: [name] [name]. MRN: [id number] DOB: Jul 16, 1980   Cancelled Treatment:    Reason Eval/Treat Not Completed: PT screened, no needs identified, will sign off Patient reports he has been ambulating in room, and has no difficulty with mobility.  Identified no acute PT needs.  PT will sign off.  Patient in agreement.  Despina Pole 11/21/2014, 7:47 PM Carita Pian. Sanjuana Kava, [place] Pager [phone]

## 2014-11-21 NOTE — Progress Notes (Addendum)
Patient removed telemetry box.  Patient refused to wear telemetry while he is in pain, isn't willing to place telemetry back on until he gets more pain medication.  Patient requesting pain medication.  Patient also requesting PCA for his sickle cell pain.  MD notified.  Order received for PCA @ 08:48.  Jillyn Ledger, MBA, BS, RN

## 2014-11-21 NOTE — Progress Notes (Signed)
Utilization Review Completed.  

## 2014-11-21 NOTE — Progress Notes (Signed)
Rx was consulted for Avelox for acute chest syndrome. The has already been started on IV levaquin. Dose is appropriated.   Rx will sign off  Onnie Boer, PharmD Pager: [phone] 11/21/2014 2:28 PM

## 2014-11-21 NOTE — Progress Notes (Signed)
Interval Progress Note   S: Called to the room by nursing staff due to patient being "verbally abusive" around 6 am this morning. I went and talked to Mr. [name] [name], who at this time feels that he is not getting enough pain medicine. He says he does not feel adequately cared for. He feels that he needs more pain medicine to take care of his pain. He is refusing to wear telemetry unless he gets more pain medicine. He was very upset. I asked him to calm down, and I assured him that we would treat his pain. I let him know that we were being cautious due to the potential for oversedation with the medication that he is receiving.   O:  Filed Vitals:   11/21/14 0252  BP: 116/77  Pulse: 84  Temp: 97.7 F (36.5 C)  Resp: 16   Exam deferred at this time.   A/P: 33 y/o M here with Acute Chest Syndrome and Sickle Cell Pain Crisis.  - Given one gram of dilaudid at time of visit.  - Additionally, changed to dilaudid PCA at this time.  - Will continue to monitor pain and improvement.  - Will recheck / adjust medications as indicated.   Paula Compton, MD Family Medicine - PGY 2

## 2014-11-21 NOTE — Progress Notes (Signed)
Lab unable to obtain enough blood for type and screen. Pt refused to be restuck.He was made aware that we can't do blood transfusion if type and screen not done. Dr. Prince Rome also notified.

## 2014-11-21 NOTE — H&P (Signed)
[facility] Admission History and Physical Service Pager: [phone]  Patient name: [name] [name]. Medical record number: [id number] Date of birth: [birth year] Age: 34 y.o. Gender: male  Primary Care Provider: Tawanna Sat, MD Consultants: None Code Status: Full  Chief Complaint: Pain in his back and arms.   Assessment and Plan: [name] Wilmott. is a 34 y.o. male presenting with Sickle Cell Pain Crisis and Acute Chest Syndrome . PMH is significant for Sickle Cell Anemia SS, CKD, GERD, Polysubstance Abuse, Chronic Pain.   # Acute Chest Syndrome / Sickle Cell Pain Crisis - Pt. Presented to the ED with pain unrelieved by home pain meds which included pain in his left chest with breathing. Found to have elevated WBC count to 17, CXR with left lower lobe / base consolidation, focal left lower lobe lung exam crackles,  Troponin negative, Reticulocytes 10.6% up from baeline 7-8, Hgb 8 which is normal for him (8-10). He was afebrile in the ED with normal vital signs. HR 101 x 2, but otherwise normal. Not hypoxic. PERC score negative. This is consistent with Mild Acute Chest Syndrome, and Sickle Cell Pain Crisis.  - Admit to inpatient Dr. Ardelia Mems Attending.  - Telemetry at least overnight.  - Pain control with Dilaudid 1mg  q4 prn. (home regimen is Oxycodone 10mg  q4 prn) - Holding scheduled toradol given AKI at this time.  - Schedule Tylenol q6 hrs.  - Trend Hgb and Retics - IVF with D5, 0.45% NS at 150cc/hr until the am.  - Levaquin q 24 hrs. For Acute chest.  - Bowel regimen with Senna / Miralax - Continuing his home Hydroxyurea - Needs SW given that the pt. States he has transportation issues in getting to see his Hematologist at [facility]. He hasn't seen them in > 1 year.   # Sickle Cell Disease - As above. Hgb is stable at 8 at this time. Bilirubin is mildly elevated. No signs of sequestration on exam. Platelets are 230 and baseline. Retic count is mildly  elevated. He has pain in his legs and his back per above.  - Continue to treat sickle cell crisis as above.  - Continue hydroxyurea - He needs follow up with Hematology.  - trending CBC, Retics.  - He has had an echocardiogram already this year in 03/2014 without evidence of PAH.  - Advised him that cocaine and marijuana would only make his disease worse.   # AKI on CKD II-III  - Not inconsistent with prior presentations. Most likely prerenal in etiology. Will replete fluids, and monitor. Investigate further if necessary.  - Fluid repletion as above. 1.5x MIVF for around 24 hours.  - Trending BMET.  - No significant electrolyte abnormalities at this time.   # Polysubstance Abuse - He has been positive for cocaine, Marijuana in the past. Obviously not helping his sickle cell disease. He acknowledges that this is not good for him.  - Social work to see - UDS - Elevated LFT's likely related to his habits in the past. - Will place on CIWA preemptively.    # GERD - protonix.   FEN/GI: Regular diet, D50.45%NS at 150cc/hr.  Prophylaxis: Lovenox.   Disposition: Pending improvement in pain.   History of Present Illness:  [name] [name]. is a 34 y.o. male presenting with pain in his back and arms that began at around 1 am yesterday morning. He says that his pain progressively worsened into the morning. He took several tylenol in addition  to several alleve which did not help his pain. He says he is out of narcotic pain medications. He is no longer getting prescriptions from Dr. Alyson Ingles or from [facility]. He says he had nausea and vomiting x 3. He has also had some pain in the left side of his chest with deep inspiration. He denies frank SOB, but does endorse fatigue. He has not had any abdominal pain, or further nausea / vomiting. He denies fevers, chills, recent illnesses, congestion, sick contacts. He also denies numbness, tingling, vision changes, or other neurological changes. He denies  exertion outside or dehydration. He is unsure of the trigger for his sickle cell pain, and he says it "just came on". He does continue to endorse smoking marijuana and cocaine. He did not endorse doing this at the time that his pain started. Otherwise, he says that he still has not gone to visit his hematologist and says that it has been over one year since he has seen them. His hematologist is at [facility].   Review Of Systems: Per HPI with the following additions: None Otherwise 12 point review of systems was performed and was unremarkable.  Patient Active Problem List   Diagnosis Date Noted  . Hb-SS disease with crisis ([place])   . Sickle cell anemia ([place]) 08/18/2014  . Nausea with vomiting   . Polysubstance abuse 03/22/2014  . Chest pain   . Sickle cell crisis ([place]) 03/19/2014  . Vaso-occlusive sickle cell crisis ([place]) 05/09/2013  . Cocaine abuse 09/26/2012  . Acute-on-chronic kidney injury ([place]) 09/26/2012  . Systolic murmur [id number]  . Migraine 11/26/2009  . CHRONIC KIDNEY DISEASE STAGE II ([facility]) 03/06/2009  . Sickle cell disease, type SS. No outpatient prescriptions from [facility] due to cocaine positive, opiate negative on high doses of given narcotics 06/18/2008  . TOBACCO ABUSE 05/22/2007   Past Medical History: Past Medical History  Diagnosis Date  . Sickle cell anemia (HCC)   . Sickle cell crisis ([place]) 09/25/2012  . Bacterial pneumonia ~ 2012    "caught it here in the hospital" (09/25/2012)  . History of blood transfusion     "always related to sickle cell crisis" (09/25/2012)  . GERD (gastroesophageal reflux disease)     "after I eat alot of spicey foods" (09/25/2012)  . Migraines     "take RX qd to prevent them" (09/25/2012)  . Cyst of brain     "2 really small ones in the back of my head; inside; saw them w/MRI" (09/25/2012)  . Chronic kidney disease     "from my sickle cell" (09/25/2012)   Past Surgical History: Past Surgical History  Procedure Laterality Date  .  Cholecystectomy  ~ 2012   Social History: Social History  Substance Use Topics  . Smoking status: Current Some Day Smoker    Types: Cigarettes  . Smokeless tobacco: Never Used     Comment: 09/25/2012 "I don't buy cigarettes; bum one from friends q now and then"  . Alcohol Use: 0.0 oz/week    0 Standard drinks or equivalent per week     Comment: occasionally   Additional social history: Polysubstance Abuse. Cocaine, Tobacco, Marijuana, and ETOH.   Please also refer to relevant sections of EMR.  Family History: Family History  Problem Relation Age of Onset  . Breast cancer Mother    Allergies and Medications: Allergies  Allergen Reactions  . Morphine And Related Other (See Comments)    "real bad headaches"   No current facility-administered medications on file prior  to encounter.   Current Outpatient Prescriptions on File Prior to Encounter  Medication Sig Dispense Refill  . enalapril (VASOTEC) 5 MG tablet Take 1 tablet (5 mg total) by mouth daily. 90 tablet 3  . hydroxyurea (HYDREA) 500 MG capsule Take 3 capsules (1,500 mg total) by mouth daily. 90 capsule 5  . nortriptyline (PAMELOR) 25 MG capsule Take 1 capsule (25 mg total) by mouth at bedtime. 30 capsule 11  . diclofenac (VOLTAREN) 75 MG EC tablet Take 1 tablet (75 mg total) by mouth 2 (two) times daily. (Patient not taking: Reported on 02/23/2014) 30 tablet 0  . Oxycodone HCl 10 MG TABS Take 1 tablet (10 mg total) by mouth every 4 (four) hours as needed. (Patient not taking: Reported on 11/20/2014) 15 tablet 0  . traMADol (ULTRAM) 50 MG tablet Take 1 tablet (50 mg total) by mouth every 8 (eight) hours as needed. (Patient not taking: Reported on 02/23/2014) 30 tablet 1    Objective: BP 115/75 mmHg  Pulse 106  Temp(Src) 98.7 F (37.1 C) (Oral)  Resp 20  Wt 130 lb (58.968 kg)  SpO2 96% Exam: General: NAD, AAOX3 Eyes: PERRLA, EOMI. Sclera are somewhat icteric.  ENTM: Canals / TM's normal, nares patent, mouth with MMM and  no palor, conjunctivae without palor.  Neck: No LAD, No thyromegaly.  Cardiovascular: RRR, 99991111 systolic ejection murmur / flow murmur LSB, 2+ distal pulses.  Respiratory: Crackles in the left lower lobe, otherwise clear breath sounds, appropriate rate, unlabored. No reproducible pain to palpation.  Abdomen: S, NT, ND, +BS, no hepatosplenomegaly palpable.  MSK: TTP over legs and low back though this is distractible. Otherwise MAEW.  Skin: No rashes, no lesions noted. Many tattoos over chest, arms, face.  Neuro: AAOx3, CN II-XII tested and in tact, 5/5 motor in all major muscle groups, no sensory deficits noted, cerebellar intact.  Psych: appropriate mood / affect.   Labs and Imaging: CBC BMET   Recent Labs Lab 11/20/14 2137  WBC 17.3*  HGB 8.0*  HCT 22.2*  PLT 230    Recent Labs Lab 11/20/14 2137  NA 139  K 4.2  CL 109  CO2 21*  BUN 29*  CREATININE 1.84*  GLUCOSE 106*  CALCIUM 8.7*     Troponin - 0.0  Retic Ct - 10.6  CXR 10/8:  IMPRESSION: Patchy left base opacity, atelectasis versus early infectious Infiltrate.   Aquilla Hacker, MD 11/21/2014, 12:41 AM PGY-2, [place] Intern pager: [phone], text pages welcome

## 2014-11-21 NOTE — Progress Notes (Signed)
Pt. Arrived the floor via stretcher. Alert & oriented x 4. Pt. Was oriented to the unit & Staff. Safety measures implemented, on tele. Pt. Refused assessment stating "i'm in pain, you need to call the MD to get me more pain medicine". MD notified. MD states he cannot make any changes to the patient's pain medicine & will see the patient in the morning. Pt. Notified of MD orders. Pt. Upset & sent me out of the room. Unable to get admission questions & assessment at this time. Family at bedside.

## 2014-11-21 NOTE — Progress Notes (Signed)
Patient states he is in extreme pain, despite pain medication.  Refuses tylenol and k-pad.  Continuously requests for more pain medication.  RN spoke with patient's girlfriend regarding situation.  Patient and girlfriend wanting to speak to MD.  Paged MD on-call.  Awaiting call back.  Will continue to monitor.

## 2014-11-22 LAB — CBC
HEMATOCRIT: 28.8 % — AB (ref 39.0–52.0)
HEMOGLOBIN: 10.3 g/dL — AB (ref 13.0–17.0)
MCH: 38.3 pg — ABNORMAL HIGH (ref 26.0–34.0)
MCHC: 35.8 g/dL (ref 30.0–36.0)
MCV: 107.1 fL — AB (ref 78.0–100.0)
Platelets: 179 10*3/uL (ref 150–400)
RBC: 2.69 MIL/uL — AB (ref 4.22–5.81)
WBC: 11.2 10*3/uL — AB (ref 4.0–10.5)

## 2014-11-22 LAB — BASIC METABOLIC PANEL
Anion gap: 6 (ref 5–15)
BUN: 8 mg/dL (ref 6–20)
CHLORIDE: 104 mmol/L (ref 101–111)
CO2: 23 mmol/L (ref 22–32)
CREATININE: 1.11 mg/dL (ref 0.61–1.24)
Calcium: 8.7 mg/dL — ABNORMAL LOW (ref 8.9–10.3)
Glucose, Bld: 108 mg/dL — ABNORMAL HIGH (ref 65–99)
POTASSIUM: 3.9 mmol/L (ref 3.5–5.1)
SODIUM: 133 mmol/L — AB (ref 135–145)

## 2014-11-22 LAB — RETICULOCYTES
RBC.: 2.67 MIL/uL — AB (ref 4.22–5.81)
RETIC CT PCT: 7.7 % — AB (ref 0.4–3.1)
Retic Count, Absolute: 205.6 10*3/uL — ABNORMAL HIGH (ref 19.0–186.0)

## 2014-11-22 MED ORDER — HYDROMORPHONE 0.3 MG/ML IV SOLN
INTRAVENOUS | Status: DC
  Administered 2014-11-23 (×2): via INTRAVENOUS

## 2014-11-22 MED ORDER — HYDROMORPHONE HCL 1 MG/ML IJ SOLN
1.0000 mg | Freq: Once | INTRAMUSCULAR | Status: AC
  Administered 2014-11-23: 1 mg via INTRAVENOUS
  Filled 2014-11-22: qty 1

## 2014-11-22 MED ORDER — HYDROMORPHONE HCL 1 MG/ML IJ SOLN
2.0000 mg | Freq: Once | INTRAMUSCULAR | Status: AC
  Administered 2014-11-22: 2 mg via INTRAVENOUS
  Filled 2014-11-22: qty 2

## 2014-11-22 MED ORDER — HYDROMORPHONE 0.3 MG/ML IV SOLN
INTRAVENOUS | Status: DC
  Administered 2014-11-22: 21:00:00 via INTRAVENOUS
  Filled 2014-11-22: qty 25

## 2014-11-22 NOTE — Progress Notes (Signed)
Family Medicine Teaching Service Daily Progress Note Intern Pager: [phone]  Patient name: [name] [name]. Medical record number: [id number] Date of birth: 06/21/1980 Age: 34 y.o. Gender: male  Primary Care Provider: Tawanna Sat, MD Consultants: None Code Status: Full  Pt Overview and Major Events to Date:  10/8: Admit for Sickle cell pain crisis and acute chest - on Dilaudid PCA & Avelox  Assessment and Plan: [name] Yax. is a 34 y.o. male presenting with Sickle Cell Pain Crisis and Acute Chest Syndrome . PMH is significant for Sickle Cell Anemia SS, CKD, GERD, Polysubstance Abuse, Chronic Pain.   # Acute Chest Syndrome / Sickle Cell Pain Crisis - Pt. Presented to the ED with pain unrelieved by home pain meds which included pain in his left chest with breathing. Found to have elevated WBC count to 17, CXR with left lower lobe / base consolidation, focal left lower lobe lung exam crackles, Troponin negative, Reticulocytes 10.6% up from baeline 7-8, Hgb 8 which is normal for him (8-10). He was afebrile in the ED with normal vital signs. HR 101 x 2, but otherwise normal. Not hypoxic. PERC score negative. This is consistent with Mild Acute Chest Syndrome, and Sickle Cell Pain Crisis.  - Telemetry discontinued  - Pain control: Dilaudid PCA (home regimen is Oxycodone 10mg  q4 prn) - No toradol given AKI at this time.  - Schedule Tylenol q6 hrs.  - Trend Hgb and Retics - IVF as below - Abx: Avelox (10/8>>  - Bowel regimen with Senna / Miralax - Continuing his home Hydroxyurea - SW Consulted: States he has transportation issues in getting to see his Hematologist at [facility]. He hasn't seen them in > 1 year.   # Sickle Cell Disease - Bilirubin is mildly elevated. No signs of sequestration on exam.  Retic count is mildly elevated. He has pain in his legs and his back per above.  - Continue to treat sickle cell crisis as above.  - Continue hydroxyurea - He needs follow up  with Hematology.  - trending CBC, Retics.  - He has had an echocardiogram already this year in 03/2014 without evidence of PAH.   # AKI on CKD II-III - Not inconsistent with prior presentations. Most likely prerenal in etiology. Will replete fluids, and monitor. Investigate further if necessary.  - Fluids as below - BMET pending  - No significant electrolyte abnormalities at this time.   # Polysubstance Abuse - He has been positive for cocaine, Marijuana in the past. Obviously not helping his sickle cell disease. He acknowledges that this is not good for him.  - Social work to see - UDS: + Cocaine, THC, Opioids  - Elevated LFT's likely related to his habits in the past. - On CIWA preemptively.   # GERD - protonix.   FEN/GI: Regular diet, D50.45%NS at 150cc/hr.  Prophylaxis: Lovenox.   Disposition: Pending improvement in pain.   Subjective:  Continues to endorse pain in his full back and leg that is improved from 10/10 to 6-8/10 today. Denies CP, SOB, palpitations. Has been eating and drinking somewhat and ambulating.   Objective: Temp:  [98 F (36.7 C)-98.9 F (37.2 C)] 98.4 F (36.9 C) (10/09 0512) Pulse Rate:  [74-92] 74 (10/09 0512) Resp:  [12-20] 20 (10/09 0527) BP: (101-121)/(64-83) 119/83 mmHg (10/09 0512) SpO2:  [99 %-100 %] 99 % (10/09 0527) FiO2 (%):  [42 %] 42 % (10/09 0527)  Physical Exam: General: NAD, AAOX3 Eyes: PERRLA, EOMI. Sclera are somewhat  icteric.  ENTM: Canals / TM's normal, nares patent, mouth with MMM and no palor, conjunctivae without palor.  Neck: No LAD, No thyromegaly.  Cardiovascular: RRR, 99991111 systolic ejection murmur / flow murmur LSB, 2+ distal pulses.  Respiratory: Crackles in the left lower lobe, otherwise clear breath sounds, appropriate rate, unlabored. No reproducible pain to palpation.  Abdomen: S, NT, ND, +BS, no hepatosplenomegaly palpable.  MSK: TTP over legs and low back though this is distractible. Otherwise MAEW.   Skin: No rashes, no lesions noted. Many tattoos over chest, arms, face.  Neuro: AAOx3,  Psych: appropriate mood / affect.   Laboratory:  Recent Labs Lab 11/20/14 2137 11/21/14 0644  WBC 17.3* 12.9*  HGB 8.0* 7.6*  HCT 22.2* 20.9*  PLT 230 183    Recent Labs Lab 11/20/14 2137 11/21/14 0644  NA 139 140  K 4.2 4.0  CL 109 112*  CO2 21* 21*  BUN 29* 19  CREATININE 1.84* 1.40*  CALCIUM 8.7* 8.5*  PROT 7.0  --   BILITOT 2.4*  --   ALKPHOS 146*  --   ALT 26  --   AST 45*  --   GLUCOSE 106* 116*    Imaging/Diagnostic Tests: Dg Chest Port 1 View  FINDINGS: There is mild patchy opacity in the left base which could represent atelectasis or early infectious infiltrate. No large effusions are evident. Right lung is clear. Pulmonary vasculature is normal.  IMPRESSION: Patchy left base opacity, atelectasis versus early infectious infiltrate.     Olam Idler, MD 11/22/2014, 8:28 AM PGY-3, [place] Intern pager: [phone], text pages welcome

## 2014-11-22 NOTE — Progress Notes (Signed)
Late entry:  PCA pump paused at 0016 in order to transfuse blood. Will resume PCA once patient has received two units of blood. MD aware. Will continue to monitor.  Shelbie Hutching, RN, BSN

## 2014-11-23 DIAGNOSIS — F1490 Cocaine use, unspecified, uncomplicated: Secondary | ICD-10-CM | POA: Insufficient documentation

## 2014-11-23 DIAGNOSIS — F1410 Cocaine abuse, uncomplicated: Secondary | ICD-10-CM

## 2014-11-23 LAB — CBC
HEMATOCRIT: 27.3 % — AB (ref 39.0–52.0)
HEMOGLOBIN: 9.7 g/dL — AB (ref 13.0–17.0)
MCH: 37.7 pg — ABNORMAL HIGH (ref 26.0–34.0)
MCHC: 35.5 g/dL (ref 30.0–36.0)
MCV: 106.2 fL — AB (ref 78.0–100.0)
Platelets: 185 10*3/uL (ref 150–400)
RBC: 2.57 MIL/uL — ABNORMAL LOW (ref 4.22–5.81)
WBC: 10.5 10*3/uL (ref 4.0–10.5)

## 2014-11-23 LAB — BASIC METABOLIC PANEL
ANION GAP: 9 (ref 5–15)
BUN: 8 mg/dL (ref 6–20)
CHLORIDE: 108 mmol/L (ref 101–111)
CO2: 21 mmol/L — AB (ref 22–32)
Calcium: 8.6 mg/dL — ABNORMAL LOW (ref 8.9–10.3)
Creatinine, Ser: 1.18 mg/dL (ref 0.61–1.24)
GFR calc non Af Amer: >60 mL/min (ref >60)
GLUCOSE: 100 mg/dL — AB (ref 65–99)
POTASSIUM: 4 mmol/L (ref 3.5–5.1)
Sodium: 138 mmol/L (ref 135–145)

## 2014-11-23 LAB — TYPE AND SCREEN
ABO/RH(D): O POS
Antibody Screen: NEGATIVE
DONOR AG TYPE: NEGATIVE
DONOR AG TYPE: NEGATIVE
UNIT DIVISION: 0
Unit division: 0

## 2014-11-23 MED ORDER — KETOROLAC TROMETHAMINE 30 MG/ML IJ SOLN
30.0000 mg | Freq: Four times a day (QID) | INTRAMUSCULAR | Status: DC
  Administered 2014-11-23 – 2014-11-24 (×5): 30 mg via INTRAVENOUS
  Filled 2014-11-23 (×5): qty 1

## 2014-11-23 MED ORDER — AZITHROMYCIN 500 MG PO TABS
500.0000 mg | ORAL_TABLET | Freq: Every day | ORAL | Status: DC
  Administered 2014-11-23 – 2014-11-24 (×2): 500 mg via ORAL
  Filled 2014-11-23 (×2): qty 1

## 2014-11-23 MED ORDER — HYDROMORPHONE HCL 1 MG/ML IJ SOLN
0.5000 mg | INTRAMUSCULAR | Status: DC | PRN
  Administered 2014-11-23 – 2014-11-24 (×6): 0.5 mg via INTRAVENOUS
  Filled 2014-11-23 (×6): qty 1

## 2014-11-23 MED ORDER — CEFDINIR 125 MG/5ML PO SUSR
300.0000 mg | Freq: Two times a day (BID) | ORAL | Status: DC
Start: 2014-11-23 — End: 2014-11-24
  Administered 2014-11-23 – 2014-11-24 (×3): 300 mg via ORAL
  Filled 2014-11-23 (×6): qty 15

## 2014-11-23 NOTE — Clinical Documentation Improvement (Signed)
Family Medicine  Abnormal Lab/Test Results:  CXR noted  Please clarify any Possible Clinical Conditions associated with below indicators. Please update your documentation within the medical record to reflect your response to this query. Thank you    Other Condition  Cannot Clinically Determine   Supporting Information: 11/23/14 prog note..."to the ED with pain unrelieved by home pain meds which included pain in his left chest with breathing. Found to have elevated WBC count to 17, CXR with left lower lobe / base consolidation, focal left lower lobe lung exam crackles,..."..Marland Kitchen"- IVF as below- Abx: Avelox (10/8>>..."  Treatment Provided: See above note  Please exercise your independent, professional judgment when responding. A specific answer is not anticipated or expected.  Thank You,  Ermelinda Das, RN, BSN, [place] Certified Clinical Documentation Specialist [place]: Health Information Management [phone]

## 2014-11-23 NOTE — Progress Notes (Signed)
Dilaudid IV PCA wasted in sink witnessed by Orson Gear, Agricultural consultant.

## 2014-11-23 NOTE — Progress Notes (Signed)
Family Medicine Teaching Service Daily Progress Note Intern Pager: [phone]  Patient name: [name] [name]. Medical record number: [id number] Date of birth: [birth year] Age: 34 y.o. Gender: male  Primary Care Provider: Tawni Carnes, MD Consultants: None Code Status: Full  Pt Overview and Major Events to Date:  10/8: Admit for Sickle cell pain crisis and acute chest - on Dilaudid PCA & Avelox  Assessment and Plan: Pranjal Mayhew. is a 34 y.o. male presenting with Sickle Cell Pain Crisis and Acute Chest Syndrome . PMH is significant for Sickle Cell Anemia SS, CKD, GERD, Polysubstance Abuse, Chronic Pain.   # Acute Chest Syndrome / Sickle Cell Pain Crisis - Patient denies any chest pain or SOB . Vitals stable RR 16 and Pulse ox 100% on room air.  Admitted with chest pain, CXR with left lower lobe consolidation. Troponin negative, Reticulocytes 10.6% up from baeline 7-8, Hgb 8 which is normal for him (8-10). PERC score 0. - WBC  Trending down 17>11.2>10.5  - Retic Ct 10.3> 7.7 - Hgb 7.6> 2uRBCs> 10.3>9.7  - UDS positive for cocaine, THC, Opiates  - D/C Dilaudid PCA (home regimen is Oxycodone 10mg  q4 prn), will start dilaudid 0.5 mg prn  - Toradol 30 mg q6hrs  - Schedule Tylenol q6 hrs.  - Trend Hgb and Retics -  D/C D5 1/2 NS @ 150, 1.5 MIVF  - Abx: Avelox (10/8>> 10/9), switched to azithromycin and omnicef  - Bowel regimen with Senna / Miralax - Continuing his home Hydroxyurea - SW Consulted: States he has transportation issues in getting to see his Acupuncturist at [facility]. He hasn't seen them in > 1 year.   # Sickle Cell Disease - Bilirubin is mildly elevated. No signs of sequestration on exam.  Retic count is mildly elevated. He has pain in his legs and his back per above.  - Continue to treat sickle cell crisis as above.  - Continue hydroxyurea - follow up with Hematology.  - He has had an echocardiogram already this year in 03/2014 without evidence of PAH.   #  Resolved AKI on CKD II-III - SCr 1.84>>1.11.  Most likely prerenal in etiology. Will replete fluids, and monitor. - Fluids as above    # Polysubstance Abuse - He has been positive for cocaine, Marijuana in the past. Obviously not helping his sickle cell disease. He acknowledges that this is not good for him.  - Social work to see - UDS: + Cocaine, THC, Opioids   # GERD - protonix.   FEN/GI: Regular diet, D50.45%NS at 150cc/hr.  Prophylaxis: Lovenox.   Disposition: Pending improvement in pain.   Subjective:  Patient states he still has some back pain. States that his pain is about a 5 right now.   Objective: Temp:  [98 F (36.7 C)-99.1 F (37.3 C)] 98 F (36.7 C) (10/10 0510) Pulse Rate:  [78-92] 92 (10/10 0510) Resp:  [13-18] 16 (10/10 0510) BP: (104-123)/(71-81) 104/71 mmHg (10/10 0510) SpO2:  [94 %-100 %] 100 % (10/10 0510) FiO2 (%):  [43 %] 43 % (10/09 1239) Weight:  [125 lb (56.7 kg)] 125 lb (56.7 kg) (10/09 2135)  Physical Exam: General: NAD, AAOX3 Cardiovascular: RRR,  No murmur heard, 2+ distal pulses.  Respiratory: Crackles in the left lower lobe, otherwise clear breath sounds, appropriate rate, unlabored. No reproducible pain to palpation.  Abdomen:  NTND, +BS MSK: No lower extremity edema  Skin: No rashes, no lesions noted. Many tattoos over chest, arms, face.   Laboratory:  Recent Labs Lab 11/21/14 0644 11/22/14 2230 11/23/14 0459  WBC 12.9* 11.2* 10.5  HGB 7.6* 10.3* 9.7*  HCT 20.9* 28.8* 27.3*  PLT 183 179 185    Recent Labs Lab 11/20/14 2137 11/21/14 0644 11/22/14 0710 11/23/14 0459  NA 139 140 133* 138  K 4.2 4.0 3.9 4.0  CL 109 112* 104 108  CO2 21* 21* 23 21*  BUN 29* 19 8 8   CREATININE 1.84* 1.40* 1.11 1.18  CALCIUM 8.7* 8.5* 8.7* 8.6*  PROT 7.0  --   --   --   BILITOT 2.4*  --   --   --   ALKPHOS 146*  --   --   --   ALT 26  --   --   --   AST 45*  --   --   --   GLUCOSE 106* 116* 108* 100*    Imaging/Diagnostic  Tests: Dg Chest Port 1 View  FINDINGS: There is mild patchy opacity in the left base which could represent atelectasis or early infectious infiltrate. No large effusions are evident. Right lung is clear. Pulmonary vasculature is normal.  IMPRESSION: Patchy left base opacity, atelectasis versus early infectious infiltrate.     Meko Bellanger Mayra Reel, MD 11/23/2014, 9:33 AM PGY-1, [facility] Family Medicine [facility] Intern pager: [phone], text pages welcome

## 2014-11-23 NOTE — Progress Notes (Signed)
11/23/2014 10:46 AM  10cc of PCA dilaudid (3mg  total) wasted with Kizzie Ide, RN in the sink.  Princella Pellegrini

## 2014-11-24 LAB — RETICULOCYTES
RBC.: 2.53 MIL/uL — AB (ref 4.22–5.81)
RETIC COUNT ABSOLUTE: 164.5 10*3/uL (ref 19.0–186.0)
RETIC CT PCT: 6.5 % — AB (ref 0.4–3.1)

## 2014-11-24 LAB — CBC
HCT: 26.8 % — ABNORMAL LOW (ref 39.0–52.0)
HEMOGLOBIN: 9.4 g/dL — AB (ref 13.0–17.0)
MCH: 37.2 pg — AB (ref 26.0–34.0)
MCHC: 35.1 g/dL (ref 30.0–36.0)
MCV: 105.9 fL — ABNORMAL HIGH (ref 78.0–100.0)
PLATELETS: 184 10*3/uL (ref 150–400)
RBC: 2.53 MIL/uL — ABNORMAL LOW (ref 4.22–5.81)
WBC: 6.6 10*3/uL (ref 4.0–10.5)

## 2014-11-24 MED ORDER — FOLIC ACID 1 MG PO TABS: 1.0000 mg | ORAL_TABLET | Freq: Every day | ORAL | Status: DC

## 2014-11-24 MED ORDER — OXYCODONE HCL 10 MG PO TABS: 10.0000 mg | ORAL_TABLET | ORAL | Status: DC | PRN

## 2014-11-24 MED ORDER — AZITHROMYCIN 500 MG PO TABS: 500.0000 mg | ORAL_TABLET | Freq: Every day | ORAL | Status: DC

## 2014-11-24 MED ORDER — SENNOSIDES-DOCUSATE SODIUM 8.6-50 MG PO TABS: 1.0000 | ORAL_TABLET | Freq: Two times a day (BID) | ORAL | Status: DC

## 2014-11-24 MED ORDER — CEFDINIR 300 MG PO CAPS: 300.0000 mg | ORAL_CAPSULE | Freq: Two times a day (BID) | ORAL | Status: DC

## 2014-11-24 MED ORDER — OXYCODONE HCL 10 MG PO TABS
10.0000 mg | ORAL_TABLET | ORAL | Status: DC | PRN
Start: 2014-11-24 — End: 2014-11-24

## 2014-11-24 NOTE — Discharge Summary (Signed)
[facility] Discharge Summary  Patient name: [name] [name]. Medical record number: [id number] Date of birth: Mar 03, 1980 Age: 34 y.o. Gender: male Date of Admission: 11/20/2014  Date of Discharge: 11/24/2014  Admitting Physician: Leeanne Rio, MD  Primary Care Provider: Tawanna Sat, MD Consultants: None   Indication for Hospitalization: Acute chest syndrome  Discharge Diagnoses/Problem List:  AKI  Sickle cell pain crisis  Sickle disease  Cocaine abuse   Disposition:  Home  Discharge Condition: Good, stable   Discharge Exam: General: NAD, AAOX3 Cardiovascular: RRR, No murmur heard, 2+ distal pulses.  Respiratory: Crackles in the left lower lobe, otherwise clear breath sounds, appropriate rate, unlabored. No reproducible pain to palpation.  Abdomen: NTND, +BS MSK: No lower extremity edema  Skin: No rashes, no lesions noted. Many tattoos over chest, arms, face.    Brief Hospital Course:  Patient was admitted for acute chest syndrome, with left sided chest pain with inspiration. CXR was significat for a left lower lobe consolidation. Patient was started on 1.5 MIVF, was transfused 2 units of RBCs with a goal hgb between 8 -10 per acute chest protocol. Patient was also treated for possible pneumonia, initially with a Avelox/Levofloxacin and then transitioned to [facility] and azithromycin with 7 day course ending on 10/14 per acute chest protocol. On admission, patient also had an acute on chronic kidney injury, likely prerenal due to patient's hydration status, this resolved during progression of patient's stay.  Pain was managed initally with PCA and then patient was transitioned to Toradol and Dilaudid once patient's AKI had resolved. Finally, patient indicates that he has not seen his hematologist at [facility] due to transportation issues recently and is currently out of his pain medication. Therefore made an out-patient appointment with [facility],  per patient he does not want social work's help with transportation and will be able to obtain this himself. Patient vitals and oxygen saturation remained stable throughout his stay and he felt ready for discharge.   Issues for Follow Up:  1. Please recheck hgb and reticulocytes count as an outpatient  2. Continue azithromycin and Omnicef with a stop date for antibiotics 11/27/2014,  3. Patient needs to follow up with  [facility] hematology on 12/09/2014 at 8:30 AM 4. Will continue home pain regiment, have prescribed pain medication for patient until patient's appointment at [name]  Hematology    Significant Procedures: None  Significant Labs and Imaging:   Recent Labs Lab 11/22/14 2230 11/23/14 0459 11/24/14 0718  WBC 11.2* 10.5 6.6  HGB 10.3* 9.7* 9.4*  HCT 28.8* 27.3* 26.8*  PLT 179 185 184    Recent Labs Lab 11/20/14 2137 11/21/14 0644 11/22/14 0710 11/23/14 0459  NA 139 140 133* 138  K 4.2 4.0 3.9 4.0  CL 109 112* 104 108  CO2 21* 21* 23 21*  GLUCOSE 106* 116* 108* 100*  BUN 29* 19 8 8   CREATININE 1.84* 1.40* 1.11 1.18  CALCIUM 8.7* 8.5* 8.7* 8.6*  ALKPHOS 146*  --   --   --   AST 45*  --   --   --   ALT 26  --   --   --   ALBUMIN 4.2  --   --   --    Dg Chest Port 1 View  11/20/2014   CLINICAL DATA:  Chest pain and low back pain, onset this afternoon. Dyspnea.  EXAM: PORTABLE CHEST 1 VIEW  COMPARISON:  08/17/2014  FINDINGS: There is mild patchy opacity in the  left base which could represent atelectasis or early infectious infiltrate. No large effusions are evident. Right lung is clear. Pulmonary vasculature is normal.  IMPRESSION: Patchy left base opacity, atelectasis versus early infectious infiltrate.   Electronically Signed   By: Andreas Newport M.D.   On: 11/20/2014 21:51   Results/Tests Pending at Time of Discharge: None   Discharge Medications:    Medication List    TAKE these medications        azithromycin 500 MG tablet  Commonly known as:  ZITHROMAX   Take 1 tablet (500 mg total) by mouth daily.     cefdinir 300 MG capsule  Commonly known as:  OMNICEF  Take 1 capsule (300 mg total) by mouth 2 (two) times daily.     diclofenac 75 MG EC tablet  Commonly known as:  VOLTAREN  Take 1 tablet (75 mg total) by mouth 2 (two) times daily.     enalapril 5 MG tablet  Commonly known as:  VASOTEC  Take 1 tablet (5 mg total) by mouth daily.     folic acid 1 MG tablet  Commonly known as:  FOLVITE  Take 1 tablet (1 mg total) by mouth daily.     hydroxyurea 500 MG capsule  Commonly known as:  HYDREA  Take 3 capsules (1,500 mg total) by mouth daily.     nortriptyline 25 MG capsule  Commonly known as:  PAMELOR  Take 1 capsule (25 mg total) by mouth at bedtime.     Oxycodone HCl 10 MG Tabs  Take 1 tablet (10 mg total) by mouth every 4 (four) hours as needed.     senna-docusate 8.6-50 MG tablet  Commonly known as:  Senokot-S  Take 1 tablet by mouth 2 (two) times daily.     traMADol 50 MG tablet  Commonly known as:  ULTRAM  Take 1 tablet (50 mg total) by mouth every 8 (eight) hours as needed.        Discharge Instructions: Please refer to Patient Instructions section of EMR for full details.  Patient was counseled important signs and symptoms that should prompt return to medical care, changes in medications, dietary instructions, activity restrictions, and follow up appointments.   Follow-Up Appointments: Follow-up Information    Follow up with DEP, Levora Angel, NP. Go on 12/09/2014.   Specialty:  Hematology and Oncology   Why:  8:30am Hospital follow up for Sickle Cell pain managment   Contact information:   [place] [id number] [phone]       Follow up with Evette Doffing, MD. Go on 11/27/2014.   Specialty:  Family Medicine   Why:  Hospital follow-up @ 3:30 PM    Contact information:   [place] [address] [phone]       Tonette Bihari, MD 11/24/2014,  3:07 PM PGY-1, [place]

## 2014-11-24 NOTE — Care Management Important Message (Signed)
Important Message  Patient Details  Name: [name] [name]. MRN: [id number] Date of Birth: Oct 18, 1980   Medicare Important Message Given:  Yes-second notification given    Delorse Lek 11/24/2014, 11:22 AM

## 2014-11-24 NOTE — Progress Notes (Signed)
[name]. to be D/C'd Home per MD order.  Discussed prescriptions and follow up appointments with the patient. Prescriptions given to patient, medication list explained in detail. Pt verbalized understanding.    Medication List    TAKE these medications        azithromycin 500 MG tablet  Commonly known as:  ZITHROMAX  Take 1 tablet (500 mg total) by mouth daily.     cefdinir 300 MG capsule  Commonly known as:  OMNICEF  Take 1 capsule (300 mg total) by mouth 2 (two) times daily.     diclofenac 75 MG EC tablet  Commonly known as:  VOLTAREN  Take 1 tablet (75 mg total) by mouth 2 (two) times daily.     enalapril 5 MG tablet  Commonly known as:  VASOTEC  Take 1 tablet (5 mg total) by mouth daily.     folic acid 1 MG tablet  Commonly known as:  FOLVITE  Take 1 tablet (1 mg total) by mouth daily.     hydroxyurea 500 MG capsule  Commonly known as:  HYDREA  Take 3 capsules (1,500 mg total) by mouth daily.     nortriptyline 25 MG capsule  Commonly known as:  PAMELOR  Take 1 capsule (25 mg total) by mouth at bedtime.     Oxycodone HCl 10 MG Tabs  Take 1 tablet (10 mg total) by mouth every 4 (four) hours as needed.     senna-docusate 8.6-50 MG tablet  Commonly known as:  Senokot-S  Take 1 tablet by mouth 2 (two) times daily.     traMADol 50 MG tablet  Commonly known as:  ULTRAM  Take 1 tablet (50 mg total) by mouth every 8 (eight) hours as needed.        Filed Vitals:   11/24/14 1300  BP: 136/85  Pulse: 85  Temp: 98.7 F (37.1 C)  Resp: 16    Skin clean, dry and intact without evidence of skin break down, no evidence of skin tears noted. IV catheter discontinued intact. Site without signs and symptoms of complications. Dressing and pressure applied. Pt denies pain at this time. No complaints noted.  An After Visit Summary was printed and given to the patient. Patient provided education material on sickle cell crisis. Patient escorted via [place], and D/C home  via private auto.  Retta Mac BSN, RN

## 2014-11-24 NOTE — Discharge Instructions (Signed)
You will need to continue antibiotics until 11/27/2014. I have provided you with oxycodone until you can get to  [name] [name]. Your appointment is on 26th at [facility]. Please follow up with family medicine as well.    Sickle Cell Anemia, Adult Sickle cell anemia is a condition in which red blood cells have an abnormal "sickle" shape. This abnormal shape shortens the cells' life span, which results in a lower than normal concentration of red blood cells in the blood. The sickle shape also causes the cells to clump together and block free blood flow through the blood vessels. As a result, the tissues and organs of the body do not receive enough oxygen. Sickle cell anemia causes organ damage and pain and increases the risk of infection. CAUSES  Sickle cell anemia is a genetic disorder. Those who receive two copies of the gene have the condition, and those who receive one copy have the trait. RISK FACTORS The sickle cell gene is most common in people whose families originated in [place]. Other areas of the globe where sickle cell trait occurs include the [place], [place] and [place], and the [place].  SIGNS AND SYMPTOMS  Pain, especially in the extremities, back, chest, or abdomen (common). The pain may start suddenly or may develop following an illness, especially if there is dehydration. Pain can also occur due to overexertion or exposure to extreme temperature changes.  Frequent severe bacterial infections, especially certain types of pneumonia and meningitis.  Pain and swelling in the hands and feet.  Decreased activity.   Loss of appetite.   Change in behavior.  Headaches.  Seizures.  Shortness of breath or difficulty breathing.  Vision changes.  Skin ulcers. Those with the trait may not have symptoms or they may have mild symptoms.  DIAGNOSIS  Sickle cell anemia is diagnosed with blood tests that demonstrate the genetic trait. It is often diagnosed during the  newborn period, due to mandatory testing nationwide. A variety of blood tests, X-rays, CT scans, MRI scans, ultrasounds, and lung function tests may also be done to monitor the condition. TREATMENT  Sickle cell anemia may be treated with:  Medicines. You may be given pain medicines, antibiotic medicines (to treat and prevent infections) or medicines to increase the production of certain types of hemoglobin.  Fluids.  Oxygen.  Blood transfusions. HOME CARE INSTRUCTIONS   Drink enough fluid to keep your urine clear or pale yellow. Increase your fluid intake in hot weather and during exercise.  Do not smoke. Smoking lowers oxygen levels in the blood.   Only take over-the-counter or prescription medicines for pain, fever, or discomfort as directed by your health care provider.  Take antibiotics as directed by your health care provider. Make sure you finish them it even if you start to feel better.   Take supplements as directed by your health care provider.   Consider wearing a medical alert bracelet. This tells anyone caring for you in an emergency of your condition.   When traveling, keep your medical information, health care provider's names, and the medicines you take with you at all times.   If you develop a fever, do not take medicines to reduce the fever right away. This could cover up a problem that is developing. Notify your health care provider.  Keep all follow-up appointments with your health care provider. Sickle cell anemia requires regular medical care. SEEK MEDICAL CARE IF: You have a fever. SEEK IMMEDIATE MEDICAL CARE IF:   You feel  dizzy or faint.   You have new abdominal pain, especially on the left side near the stomach area.   You develop a persistent, often uncomfortable and painful penile erection (priapism). If this is not treated immediately it will lead to impotence.   You have numbness your arms or legs or you have a hard time moving them.    You have a hard time with speech.   You have a fever or persistent symptoms for more than 2-3 days.   You have a fever and your symptoms suddenly get worse.   You have signs or symptoms of infection. These include:   Chills.   Abnormal tiredness (lethargy).   Irritability.   Poor eating.   Vomiting.   You develop pain that is not helped with medicine.   You develop shortness of breath.  You have pain in your chest.   You are coughing up pus-like or bloody sputum.   You develop a stiff neck.  Your feet or hands swell or have pain.  Your abdomen appears bloated.  You develop joint pain. MAKE SURE YOU:  Understand these instructions.   This information is not intended to replace advice given to you by your health care provider. Make sure you discuss any questions you have with your health care provider.   Document Released: 05/10/2005 Document Revised: 02/20/2014 Document Reviewed: 09/11/2012 Elsevier Interactive Patient Education [employer].

## 2014-11-26 LAB — CULTURE, BLOOD (ROUTINE X 2)
CULTURE: NO GROWTH
CULTURE: NO GROWTH

## 2014-11-27 ENCOUNTER — Inpatient Hospital Stay: Payer: Medicare Other | Admitting: Internal Medicine

## 2014-12-08 ENCOUNTER — Other Ambulatory Visit: Payer: Self-pay | Admitting: Family Medicine

## 2014-12-26 ENCOUNTER — Other Ambulatory Visit: Payer: Self-pay | Admitting: Family Medicine
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
## 2015-01-02 IMAGING — CR DG CHEST 2V
2 series · 2 of 2 positions shown · non-contrast
Comparison: PA and lateral chest of May 09, 2013

CLINICAL DATA: Sickle cell crisis with chest and left shoulder pain

EXAM:
CHEST  2 VIEW

[w chest pa]
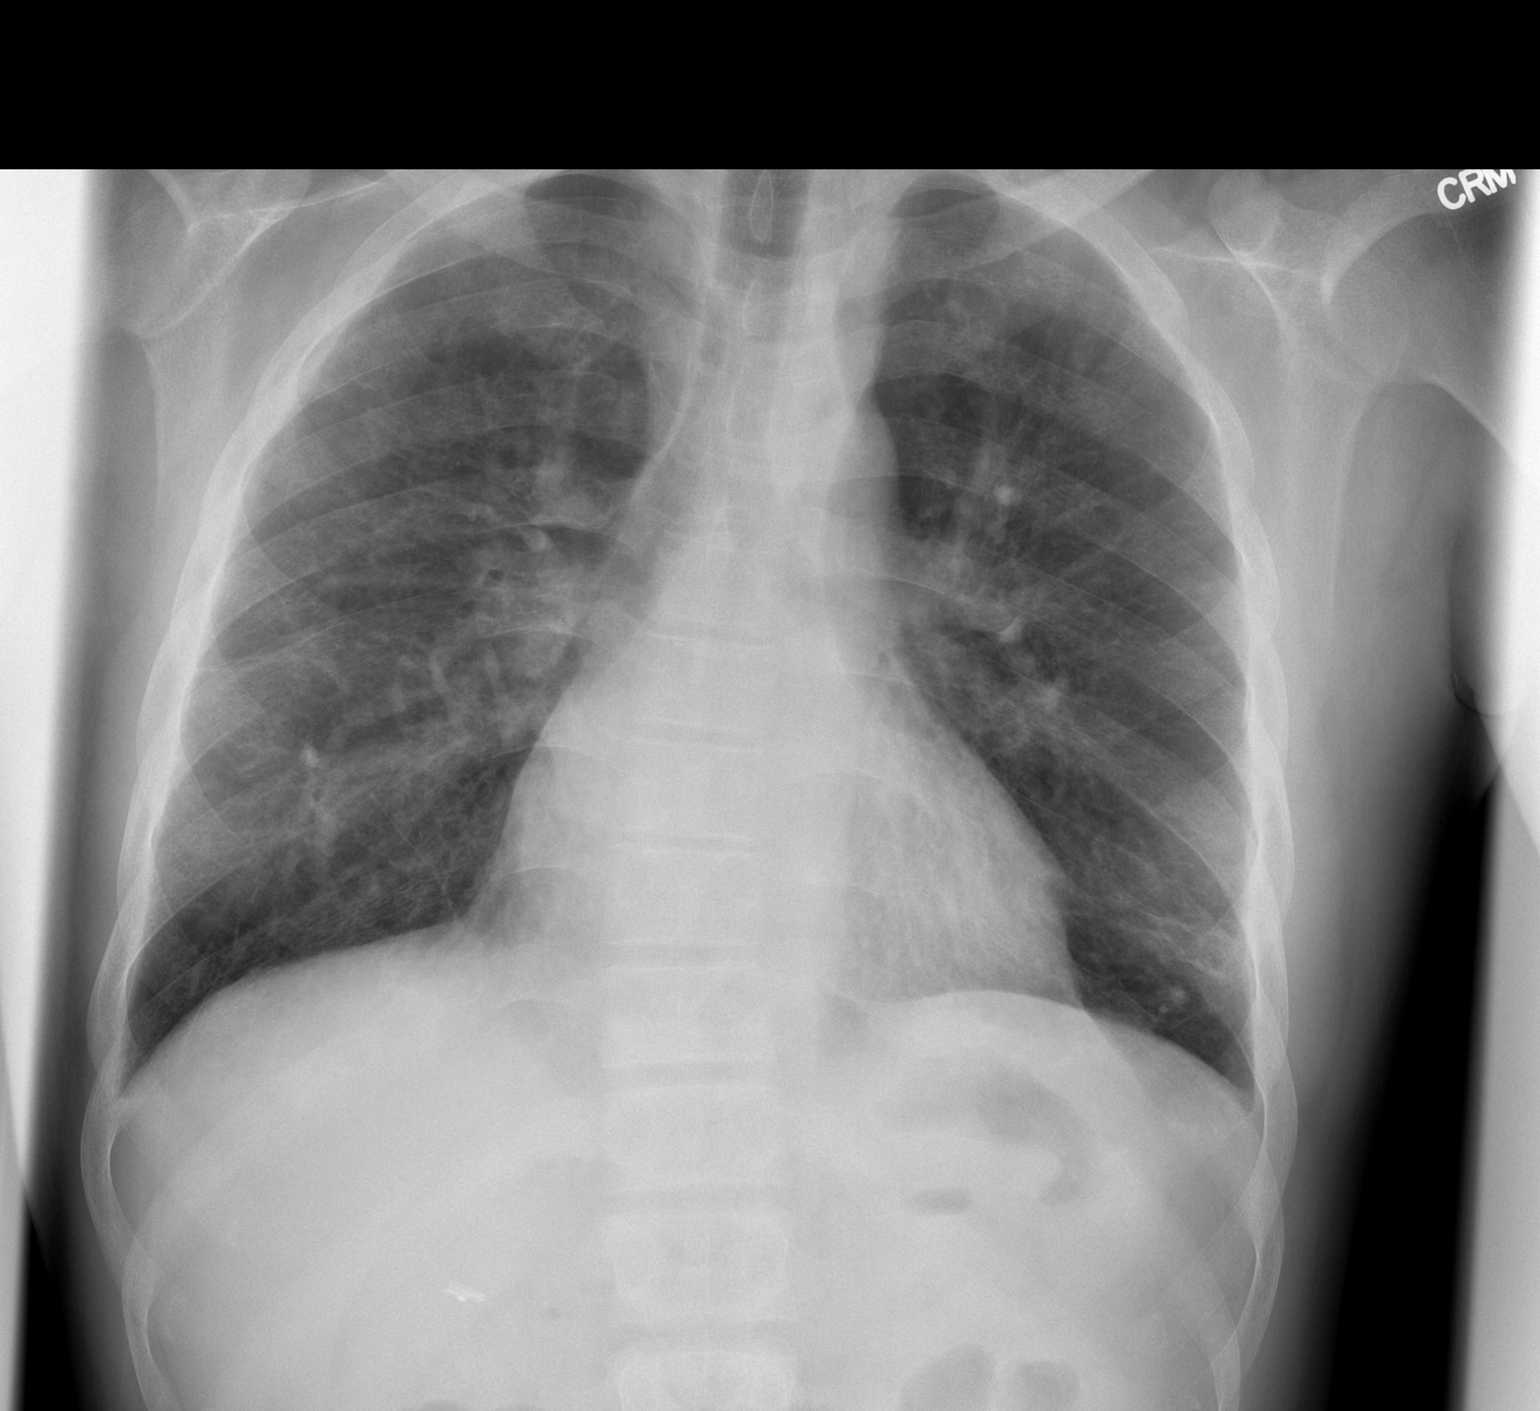

[w chest lat]
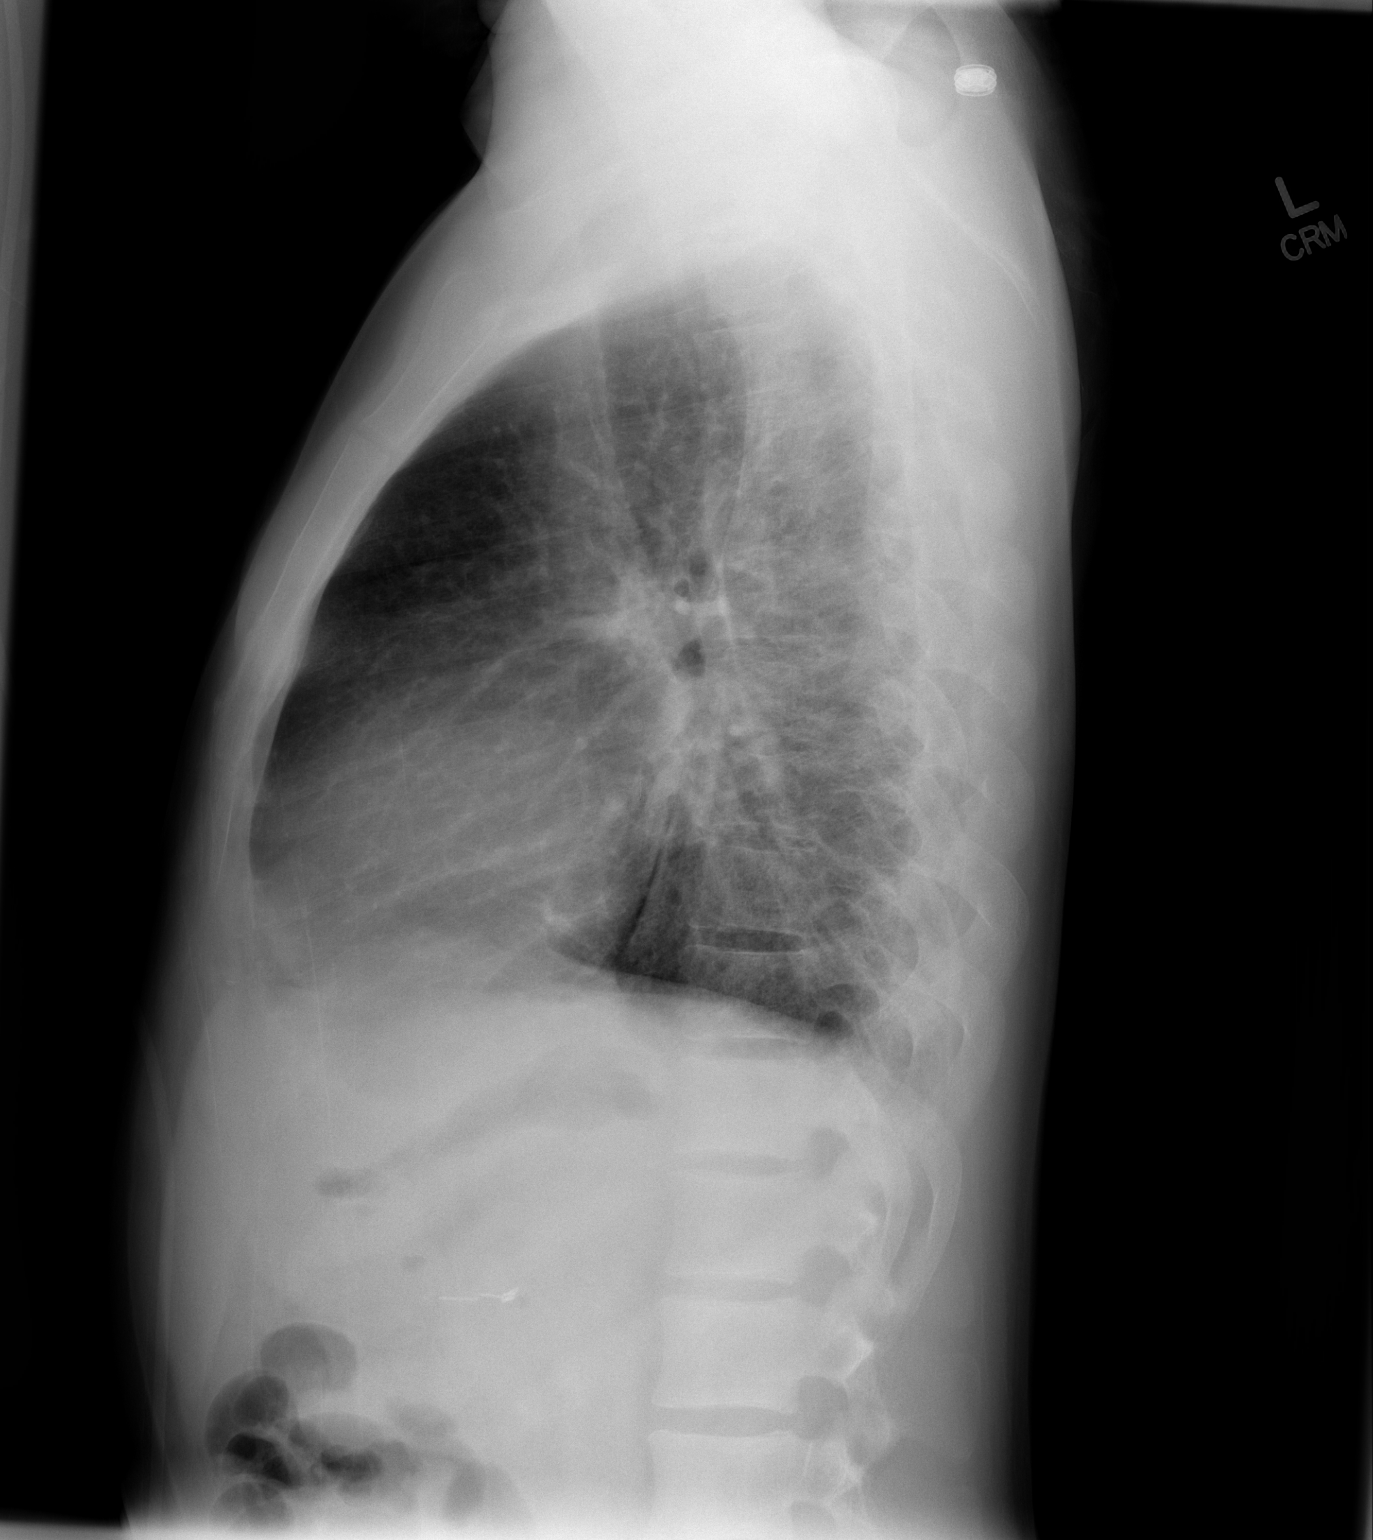

[2 of 2 positions shown; findings below may reference images not displayed]

FINDINGS: The lungs are well-expanded. Slight increase in the pulmonary
interstitial markings is present today as compared to the previous
studies. The cardiac silhouette remains borderline enlarged. The
central pulmonary vascularity is prominent and stable. There is no
significant pleural effusion. The bony thorax exhibits no acute
abnormality.
IMPRESSION: Slight interval increase in the pulmonary interstitial markings may
reflect mild edema. There is no focal pneumonia.

## 2015-02-25 DIAGNOSIS — D57219 Sickle-cell/Hb-C disease with crisis, unspecified: Secondary | ICD-10-CM | POA: Diagnosis not present

## 2015-02-25 DIAGNOSIS — F17200 Nicotine dependence, unspecified, uncomplicated: Secondary | ICD-10-CM | POA: Diagnosis not present

## 2015-03-11 ENCOUNTER — Inpatient Hospital Stay (HOSPITAL_COMMUNITY)
Admission: EM | Admit: 2015-03-11 | Discharge: 2015-03-15 | DRG: 812 | Disposition: A | Discharge status: Home / Self Care | Payer: Medicare Other | Attending: Family Medicine | Admitting: Family Medicine

## 2015-03-11 ENCOUNTER — Encounter (HOSPITAL_COMMUNITY): Payer: Self-pay | Admitting: *Deleted

## 2015-03-11 ENCOUNTER — Emergency Department (HOSPITAL_COMMUNITY): Payer: Medicare Other

## 2015-03-11 DIAGNOSIS — F17210 Nicotine dependence, cigarettes, uncomplicated: Secondary | ICD-10-CM | POA: Diagnosis present

## 2015-03-11 DIAGNOSIS — D5700 Hb-SS disease with crisis, unspecified: Secondary | ICD-10-CM | POA: Diagnosis not present

## 2015-03-11 DIAGNOSIS — N179 Acute kidney failure, unspecified: Secondary | ICD-10-CM | POA: Diagnosis present

## 2015-03-11 DIAGNOSIS — N183 Chronic kidney disease, stage 3 (moderate): Secondary | ICD-10-CM | POA: Diagnosis present

## 2015-03-11 DIAGNOSIS — I129 Hypertensive chronic kidney disease with stage 1 through stage 4 chronic kidney disease, or unspecified chronic kidney disease: Secondary | ICD-10-CM | POA: Diagnosis present

## 2015-03-11 DIAGNOSIS — D571 Sickle-cell disease without crisis: Secondary | ICD-10-CM | POA: Diagnosis present

## 2015-03-11 DIAGNOSIS — F1910 Other psychoactive substance abuse, uncomplicated: Secondary | ICD-10-CM | POA: Diagnosis present

## 2015-03-11 DIAGNOSIS — R740 Nonspecific elevation of levels of transaminase and lactic acid dehydrogenase [LDH]: Secondary | ICD-10-CM | POA: Diagnosis present

## 2015-03-11 DIAGNOSIS — Z885 Allergy status to narcotic agent status: Secondary | ICD-10-CM

## 2015-03-11 DIAGNOSIS — F1290 Cannabis use, unspecified, uncomplicated: Secondary | ICD-10-CM | POA: Diagnosis present

## 2015-03-11 DIAGNOSIS — R079 Chest pain, unspecified: Secondary | ICD-10-CM | POA: Diagnosis not present

## 2015-03-11 DIAGNOSIS — R739 Hyperglycemia, unspecified: Secondary | ICD-10-CM | POA: Diagnosis present

## 2015-03-11 DIAGNOSIS — G43909 Migraine, unspecified, not intractable, without status migrainosus: Secondary | ICD-10-CM | POA: Diagnosis present

## 2015-03-11 DIAGNOSIS — N189 Chronic kidney disease, unspecified: Secondary | ICD-10-CM | POA: Diagnosis present

## 2015-03-11 LAB — COMPREHENSIVE METABOLIC PANEL
ALK PHOS: 158 U/L — AB (ref 38–126)
ALT: 39 U/L (ref 17–63)
AST: 57 U/L — AB (ref 15–41)
Albumin: 4.3 g/dL (ref 3.5–5.0)
Anion gap: 12 (ref 5–15)
BUN: 17 mg/dL (ref 6–20)
CALCIUM: 9 mg/dL (ref 8.9–10.3)
CHLORIDE: 103 mmol/L (ref 101–111)
CO2: 22 mmol/L (ref 22–32)
CREATININE: 2.32 mg/dL — AB (ref 0.61–1.24)
GFR, EST AFRICAN AMERICAN: 41 mL/min — AB (ref >60)
GFR, EST NON AFRICAN AMERICAN: 35 mL/min — AB (ref >60)
Glucose, Bld: 128 mg/dL — ABNORMAL HIGH (ref 65–99)
Potassium: 4.1 mmol/L (ref 3.5–5.1)
Sodium: 137 mmol/L (ref 135–145)
Total Bilirubin: 4.4 mg/dL — ABNORMAL HIGH (ref 0.3–1.2)
Total Protein: 7.6 g/dL (ref 6.5–8.1)

## 2015-03-11 LAB — CBC WITH DIFFERENTIAL/PLATELET
BASOS PCT: 0 %
Basophils Absolute: 0 10*3/uL (ref 0.0–0.1)
EOS ABS: 0.2 10*3/uL (ref 0.0–0.7)
EOS PCT: 2 %
HCT: 26.1 % — ABNORMAL LOW (ref 39.0–52.0)
Hemoglobin: 9.3 g/dL — ABNORMAL LOW (ref 13.0–17.0)
LYMPHS ABS: 4 10*3/uL (ref 0.7–4.0)
Lymphocytes Relative: 37 %
MCH: 43.3 pg — AB (ref 26.0–34.0)
MCHC: 35.6 g/dL (ref 30.0–36.0)
MCV: 121.4 fL — AB (ref 78.0–100.0)
MONO ABS: 1.3 10*3/uL — AB (ref 0.1–1.0)
Monocytes Relative: 12 %
Neutro Abs: 5.4 10*3/uL (ref 1.7–7.7)
Neutrophils Relative %: 49 %
PLATELETS: 169 10*3/uL (ref 150–400)
RBC: 2.15 MIL/uL — AB (ref 4.22–5.81)
RDW: 20.3 % — AB (ref 11.5–15.5)
WBC: 10.9 10*3/uL — AB (ref 4.0–10.5)

## 2015-03-11 LAB — RAPID URINE DRUG SCREEN, HOSP PERFORMED
AMPHETAMINES: NOT DETECTED
Barbiturates: NOT DETECTED
Benzodiazepines: NOT DETECTED
Cocaine: NOT DETECTED
Opiates: POSITIVE — AB
TETRAHYDROCANNABINOL: POSITIVE — AB

## 2015-03-11 LAB — RETICULOCYTES
RBC.: 2.13 MIL/uL — ABNORMAL LOW (ref 4.22–5.81)
RETIC CT PCT: 10.7 % — AB (ref 0.4–3.1)
Retic Count, Absolute: 227.9 10*3/uL — ABNORMAL HIGH (ref 19.0–186.0)

## 2015-03-11 MED ORDER — HYDROMORPHONE HCL 1 MG/ML IJ SOLN: 1.8000 mg | Freq: Once | INTRAMUSCULAR | Status: DC

## 2015-03-11 MED ORDER — LORAZEPAM 1 MG PO TABS: 1.0000 mg | ORAL_TABLET | Freq: Four times a day (QID) | ORAL | Status: AC | PRN

## 2015-03-11 MED ORDER — LORAZEPAM 2 MG/ML IJ SOLN: 1.0000 mg | Freq: Four times a day (QID) | INTRAMUSCULAR | Status: AC | PRN

## 2015-03-11 MED ORDER — SODIUM CHLORIDE 0.45 % IV SOLN
INTRAVENOUS | Status: AC
  Administered 2015-03-11 – 2015-03-12 (×2): via INTRAVENOUS
  Filled 2015-03-11: qty 1000

## 2015-03-11 MED ORDER — HYDROMORPHONE HCL 1 MG/ML IJ SOLN: 2.2500 mg | Freq: Once | INTRAMUSCULAR | Status: DC | PRN

## 2015-03-11 MED ORDER — SODIUM CHLORIDE 0.9% FLUSH
3.0000 mL | Freq: Two times a day (BID) | INTRAVENOUS | Status: DC
  Administered 2015-03-12 – 2015-03-15 (×6): 3 mL via INTRAVENOUS

## 2015-03-11 MED ORDER — SENNOSIDES-DOCUSATE SODIUM 8.6-50 MG PO TABS
1.0000 | ORAL_TABLET | Freq: Every day | ORAL | Status: DC
Start: 2015-03-11 — End: 2015-03-15
  Administered 2015-03-12: 1 via ORAL
  Filled 2015-03-11 (×2): qty 1

## 2015-03-11 MED ORDER — ONDANSETRON HCL 4 MG/2ML IJ SOLN
4.0000 mg | INTRAMUSCULAR | Status: DC | PRN
  Administered 2015-03-11 (×2): 4 mg via INTRAVENOUS
  Filled 2015-03-11 (×2): qty 2

## 2015-03-11 MED ORDER — VITAMIN B-1 100 MG PO TABS
100.0000 mg | ORAL_TABLET | Freq: Every day | ORAL | Status: DC
  Administered 2015-03-11 – 2015-03-15 (×5): 100 mg via ORAL
  Filled 2015-03-11 (×4): qty 1

## 2015-03-11 MED ORDER — SODIUM CHLORIDE 0.45 % IV SOLN
INTRAVENOUS | Status: DC
Start: 2015-03-11 — End: 2015-03-11
  Administered 2015-03-11: 10:00:00 via INTRAVENOUS

## 2015-03-11 MED ORDER — THIAMINE HCL 100 MG/ML IJ SOLN: 100.0000 mg | Freq: Every day | INTRAMUSCULAR | Status: DC

## 2015-03-11 MED ORDER — HYDROXYUREA 500 MG PO CAPS
1500.0000 mg | ORAL_CAPSULE | Freq: Every day | ORAL | Status: DC
  Administered 2015-03-11 – 2015-03-15 (×5): 1500 mg via ORAL
  Filled 2015-03-11 (×5): qty 3

## 2015-03-11 MED ORDER — POLYETHYLENE GLYCOL 3350 17 G PO PACK
17.0000 g | PACK | Freq: Every day | ORAL | Status: DC
  Filled 2015-03-11 (×3): qty 1

## 2015-03-11 MED ORDER — KETOROLAC TROMETHAMINE 30 MG/ML IJ SOLN
30.0000 mg | INTRAMUSCULAR | Status: AC
  Administered 2015-03-11: 30 mg via INTRAVENOUS
  Filled 2015-03-11: qty 1

## 2015-03-11 MED ORDER — SODIUM CHLORIDE 0.45 % IV SOLN: INTRAVENOUS | Status: DC

## 2015-03-11 MED ORDER — HYDROMORPHONE HCL 1 MG/ML IJ SOLN
1.5000 mg | Freq: Once | INTRAMUSCULAR | Status: AC
  Administered 2015-03-11: 1.5 mg via INTRAVENOUS
  Filled 2015-03-11: qty 2

## 2015-03-11 MED ORDER — HYDROMORPHONE HCL 1 MG/ML IJ SOLN: 1.8000 mg | INTRAMUSCULAR | Status: DC | PRN

## 2015-03-11 MED ORDER — HYDROMORPHONE HCL 1 MG/ML IJ SOLN
1.0000 mg | INTRAMUSCULAR | Status: DC | PRN
  Administered 2015-03-11 – 2015-03-12 (×7): 1 mg via INTRAVENOUS
  Filled 2015-03-11 (×7): qty 1

## 2015-03-11 MED ORDER — FOLIC ACID 1 MG PO TABS
1.0000 mg | ORAL_TABLET | Freq: Every day | ORAL | Status: DC
  Administered 2015-03-11 – 2015-03-15 (×4): 1 mg via ORAL
  Filled 2015-03-11 (×5): qty 1

## 2015-03-11 MED ORDER — HYDROMORPHONE HCL 1 MG/ML IJ SOLN
1.8000 mg | Freq: Once | INTRAMUSCULAR | Status: AC | PRN
  Administered 2015-03-11: 1.8 mg via INTRAVENOUS
  Filled 2015-03-11: qty 2

## 2015-03-11 MED ORDER — NORTRIPTYLINE HCL 25 MG PO CAPS
25.0000 mg | ORAL_CAPSULE | Freq: Every day | ORAL | Status: DC
  Administered 2015-03-12 – 2015-03-14 (×3): 25 mg via ORAL
  Filled 2015-03-11 (×4): qty 1

## 2015-03-11 MED ORDER — ADULT MULTIVITAMIN W/MINERALS CH
1.0000 | ORAL_TABLET | Freq: Every day | ORAL | Status: DC
  Administered 2015-03-11 – 2015-03-15 (×5): 1 via ORAL
  Filled 2015-03-11 (×5): qty 1

## 2015-03-11 MED ORDER — HYDROMORPHONE HCL 1 MG/ML IJ SOLN
2.2500 mg | Freq: Once | INTRAMUSCULAR | Status: AC | PRN
  Administered 2015-03-11: 2.25 mg via INTRAVENOUS
  Filled 2015-03-11: qty 3

## 2015-03-11 MED ORDER — ENOXAPARIN SODIUM 40 MG/0.4ML ~~LOC~~ SOLN
40.0000 mg | SUBCUTANEOUS | Status: DC
Start: 2015-03-11 — End: 2015-03-11

## 2015-03-11 NOTE — ED Notes (Signed)
Admitting MD at bedside.

## 2015-03-11 NOTE — ED Notes (Signed)
Attempted multiple times to start an IV. Unsuccessful.

## 2015-03-11 NOTE — ED Notes (Signed)
Pt states that he is having a sickle cell crisis. States that it started 2 days ago. States that he took his home meds with no relief. Pt reports generalized pain.

## 2015-03-11 NOTE — ED Notes (Signed)
EDP at bedside  

## 2015-03-11 NOTE — ED Notes (Signed)
Pt can not void at his time.

## 2015-03-11 NOTE — H&P (Signed)
[facility] Admission History and Physical Service Pager: [phone]  Patient name: [name] [name]. Medical record number: [id number] Date of birth: 12/08/1980 Age: 35 y.o. Gender: male  Primary Care Provider: [name], MD Consultants: None Code Status: Full  Chief Complaint: Sickle cell pain crisis  Assessment and Plan: [name] [name]. is a 35 y.o. male presenting with pain in his arms, legs, and back for 3 days consistent with his typical sickle cell pain crisis. PMH is significant for sickle cell anemia, HTN,CKD, polysubstance abuse.   Sickle cell pain crisis: Patient with a h/o hemoglobin SS sickle cell disease. Pain in his legs, ankles, and back which is typical of his typical crisis per his report. Not receiving narcotic Rx from our clinic due to h/o cocaine abuse and concerns of selling opiates. Hemoglobin 9.3, baseline appears to be around 8. Retic up to 10.7%. CXR with mild increased pulmonary interstitium and scarring in R lung but stable from previous w no evidence of acute infiltrates. Is seen at [facility].  Has not been for a while - Place in observation, attending Dr [name] - Dilaudid 96m q2hrs PRN pain, will plan to transition quickly to orals  - Will avoid Toradol given Acute on Chronic kidney dz, avoid NSAIDs - Sennok-S qhs, Miralax scheduled - continue to monitor O2 saturations, supplement O2 for saturations <92%. - Monitor for acute chest, as had acute chest last admission 11/2014. - D5-1/2NS @ 3/4 MIVF  - repeat CBC with diff and retic in AM, consider transfusion if less than 7 and symptomatic  - K pads  - continue home hydroxyurea  Acute on chronic CKD, stage 3: Cr 2.32 in ED.  Baseline SCr appears to be around 1.1-1.5. - Holding home enalapril  - avoid nephrotoxic agents - renally dose medications  Transaminitis: AST and ALT have intermittently been elevated. Alk phos elevated 158. His  alk phos could be elevated secondary to bone infarcts. Currently less than a >2:1 ratio of AST:ALT.  Viral hepatitis panel negative in 2015. GGT 08/2014: 35. - Follow LFTs  Hyperglycemia: Pt noted to have a glucose of 128 in the ED. He has had some elevated blood sugars on previous admissions. No HgbA1c on file. - Hemoglobin A1c  Migraine headache.  Takes Nortriptyline daily. - Continue home medication  Polysubstance abuse: Patient with long h/o substance abuse.  - UDS ordered - CIWA protocol  Social. Pt reports that he has 2 PCPs. He is seen at our clinic and he is seen by [name] [name] who prescribes his pain medications.  - We have spoken with our clinic manager, who will re-assign [name] [name] to [name] [name]  FEN/GI: D5-1/2NS @ 60cc/hr, regular diet, miralax/senokot Prophylaxis: SQ Lovenox  Disposition: Place in observation for sickle cell pain crisis  History of Present Illness:  [name] [name] is a 35y.o. male presenting with sickle cell pain crisis that started two nights ago. The pain is located in his lower extremities, upper extremities, lower back, hands, and arms. He describes the pain as "aching". He notes he tried to take Percocet 10/325 last night and it didn't help. He also took some Aleve (2 tabs daily), without improvement. Patient becomes extremely upset when questioned about the doctor that prescribes his pain medications, but he eventually states that he receives pain medications from [name] [name] who is his primary care doctor. He says he sees both Dr. MAlyson Inglesand doctors at our clinic. He  has a follow-up appointment scheduled with [name] [name] on 03/30/15. He takes Enalapril and Hydroxyurea 1,5103m daily. He misses his Hydroxyurea dose ~2 times/week. He endorses occasional shortness of breath and chest pain during pain crises. Denies fevers. Endorses feeling cold and occasional joint swelling. No rashes. No sore throat, cough or congestion.   [facility]   In the ED, creatinine 2.32, alk phos 158, AST 57, bilirubin 4.4, WBC 10.9, Hgb 9.3 (baseline 8), reticulocytes 10.7%. CXR was negative for infiltrates. EKG showed sinus tachycardia. He was afebrile. He was given Dilaudid 160m then 1.22m722mthen 1.8mg122mhen 2.222mg76mh only mild relief of symptoms.  Review Of Systems: Per HPI with the following additions: none Otherwise the remainder of the systems were negative.  Patient Active Problem List   Diagnosis Date Noted  . Sickle cell anemia with crisis (HCC) [place]26/2017  . Cocaine use   . Sickle cell pain crisis (HCC) [place]08/2016  . Hb-SS disease with acute chest syndrome (HCC) [place] AKI (acute kidney injury) (HCC) [place] Hb-SS disease with crisis (HCC) Home Sickle cell anemia (HCC) [place]05/2016  . Nausea with vomiting   . Polysubstance abuse 03/22/2014  . Chest pain   . Sickle cell crisis (HCC) [place]04/2016  . Vaso-occlusive sickle cell crisis (HCC) [place]27/2015  . Cocaine abuse 09/26/2012  . Acute-on-chronic kidney injury (HCC) [place]14/2014  . Systolic murmur 11/2592/17/4081igraine 11/26/2009  . CHRONIC KIDNEY DISEASE STAGE II (MILD) 03/06/2009  . Sickle cell disease, type SS. [facility] positive, opiate negative on high doses of given narcotics 06/18/2008  . TOBACCO ABUSE 05/22/2007    Past Medical History: Past Medical History  Diagnosis Date  . Sickle cell anemia (HCC)   . Sickle cell crisis (HCC) [place]3/2014  . Bacterial pneumonia ~ 2012    "caught it here in the hospital" (09/25/2012)  . History of blood transfusion     "always related to sickle cell crisis" (09/25/2012)  . GERD (gastroesophageal reflux disease)     "after I eat alot of spicey foods" (09/25/2012)  . Migraines     "take RX qd to prevent them" (09/25/2012)  . Cyst of brain     "2 really small ones in the back of my head; inside; saw them w/MRI" (09/25/2012)  . Chronic kidney  disease     "from my sickle cell" (09/25/2012)    Past Surgical History: Past Surgical History  Procedure Laterality Date  . Cholecystectomy  ~ 2012    Social History: Social History  Substance Use Topics  . Smoking status: Current Some Day Smoker    Types: Cigarettes  . Smokeless tobacco: Never Used     Comment: 09/25/2012 "I don't buy cigarettes; bum one from friends q now and then"  . Alcohol Use: 0.0 oz/week    0 Standard drinks or equivalent per week     Comment: occasionally   Additional social history: Smokes 1 cigarette every other day, drinks alcohol only on special occasions, smokes marijuana a couple times a week. Please also refer to relevant sections of EMR.  Family History: Family History  Problem Relation Age of Onset  . Breast cancer Mother    Allergies and Medications: Allergies  Allergen Reactions  . Morphine And Related Other (See Comments)    "real bad headaches"   No current facility-administered medications on file prior to encounter.  Current Outpatient Prescriptions on File Prior to Encounter  Medication Sig Dispense Refill  . enalapril (VASOTEC) 5 MG tablet Take 1 tablet (5 mg total) by mouth daily. 90 tablet 3  . hydroxyurea (HYDREA) 500 MG capsule TAKE 3 CAPSULES (1,500 MG TOTAL) BY MOUTH DAILY. 90 capsule 5  . nortriptyline (PAMELOR) 25 MG capsule TAKE 1 CAPSULE (25 MG TOTAL) BY MOUTH AT BEDTIME. 30 capsule 8    Objective: BP 92/44 mmHg  Pulse 74  Temp(Src) 99.3 F (37.4 C) (Oral)  Resp 18  Ht '5\' 4"'  (1.626 m)  Wt 130 lb (58.968 kg)  BMI 22.30 kg/m2  SpO2 95% Exam: General: Tired-appearing, laying in bed, answers questions appropriately Eyes: Scleral icterus present, pinpoint pupils, EOMI, mild exotropia present ENTM: Oropharynx normal, MMM Neck: Supple, no lymphadenopathy Cardiovascular: RRR, no murmurs Respiratory: CTAB, normal work of breathing Abdomen: +BS, soft, non-tender, non-distended MSK: Tenderness to palpation of the  lower back, thighs, calves, and feet; very mild edema noted in the MCP joints of the hands bilaterally; no lower extremity edema Skin: No rashes or lesions, multiple tattoos Neuro: Sleepy, but awakens easily and answers questions appropriately; CN 2-12 grossly intact; moves all extremities Psych: Becomes angered easily when discussing narcotics, mood labile, poor eye contact  Labs and Imaging: CBC BMET   Recent Labs Lab 03/11/15 0832  WBC 10.9*  HGB 9.3*  HCT 26.1*  PLT 169    Recent Labs Lab 03/11/15 0832  NA 137  K 4.1  CL 103  CO2 22  BUN 17  CREATININE 2.32*  GLUCOSE 128*  CALCIUM 9.0     Reticulocytes 10.7%   Dg Chest 2 View  03/11/2015  CLINICAL DATA:  Left-sided chest pain with inspiration began yesterday. History of sickle cell. Smoker. EXAM: CHEST  2 VIEW COMPARISON:  11/20/2014 FINDINGS: Heart size is normal. There is prominence of interstitial markings. Stable scarring in the right mid lung zone. No focal consolidations or pleural effusions are identified. Surgical clips are identified in the right upper quadrant. IMPRESSION: Stable appearance of the chest. No focal acute pulmonary abnormality. Electronically Signed   By: [name] M.D.   On: 03/11/2015 08:33   Sela Hua, MD 03/11/2015, 2:45 PM PGY-1, [place] Intern pager: [phone], text pages welcome  I have separately seen and examined the patient. I have discussed the findings and exam with Dr Brett Albino and agree with the above note.  My changes/additions are outlined in BLUE.   Marland KitchenAshly M. Lajuana Ripple, DO PGY-2, [place]

## 2015-03-11 NOTE — Progress Notes (Signed)
[name] [name] [id number] Admission Data: 03/11/2015 5:34 PM Attending Provider: Blane Ohara McDiarmid, MD  [id number], [name] Munson, MD Consults/ Treatment Team:    [name]. is a 35 y.o. male patient admitted from ED awake, alert  & orientated  X 3,  Full Code, VSS - Blood pressure 109/50, pulse 92, temperature 99.3 F (37.4 C), temperature source Oral, resp. rate 18, height 5\' 4"  (1.626 m), weight 58.968 kg (130 lb), SpO2 95 %.,  no c/o shortness of breath, no c/o chest pain, no distress noted. Tele #1 placed, patient refusing to wear gown at this time.    IV site WDL:  SL. Allergies:   Allergies  Allergen Reactions  . Morphine And Related Other (See Comments)    "real bad headaches"     Past Medical History  Diagnosis Date  . Sickle cell anemia (HCC)   . Sickle cell crisis ([place]) 09/25/2012  . Bacterial pneumonia ~ 2012    "caught it here in the hospital" (09/25/2012)  . History of blood transfusion     "always related to sickle cell crisis" (09/25/2012)  . GERD (gastroesophageal reflux disease)     "after I eat alot of spicey foods" (09/25/2012)  . Migraines     "take RX qd to prevent them" (09/25/2012)  . Cyst of brain     "2 really small ones in the back of my head; inside; saw them w/MRI" (09/25/2012)  . Chronic kidney disease     "from my sickle cell" (09/25/2012)      Pt orientation to unit, room and routine. Information packet given to patient/family and safety video watched.  Admission INP armband ID verified with patient/family, and in place. SR up x 2, fall risk assessment complete with Patient and family verbalizing understanding of risks associated with falls. Pt verbalizes an understanding of how to use the call bell and to call for help before getting out of bed.  Skin, clean-dry- intact without evidence of bruising, or skin tears.   No evidence of skin break down noted on exam.    Will cont to monitor and assist as needed.  Dayle Points,  RN 03/11/2015 5:34 PM

## 2015-03-12 DIAGNOSIS — D5700 Hb-SS disease with crisis, unspecified: Secondary | ICD-10-CM | POA: Diagnosis not present

## 2015-03-12 LAB — CBC
HCT: 23.2 % — ABNORMAL LOW (ref 39.0–52.0)
HCT: 22.6 % — ABNORMAL LOW (ref 39.0–52.0)
HEMOGLOBIN: 8.2 g/dL — AB (ref 13.0–17.0)
Hemoglobin: 8 g/dL — ABNORMAL LOW (ref 13.0–17.0)
MCH: 42.1 pg — AB (ref 26.0–34.0)
MCH: 42.1 pg — AB (ref 26.0–34.0)
MCHC: 35.3 g/dL (ref 30.0–36.0)
MCHC: 35.4 g/dL (ref 30.0–36.0)
MCV: 119 fL — ABNORMAL HIGH (ref 78.0–100.0)
MCV: 118.9 fL — ABNORMAL HIGH (ref 78.0–100.0)
PLATELETS: 166 10*3/uL (ref 150–400)
PLATELETS: 145 10*3/uL — AB (ref 150–400)
RBC: 1.95 MIL/uL — AB (ref 4.22–5.81)
RBC: 1.9 MIL/uL — AB (ref 4.22–5.81)
RDW: 23 % — ABNORMAL HIGH (ref 11.5–15.5)
RDW: 22.8 % — ABNORMAL HIGH (ref 11.5–15.5)
WBC: 12.2 10*3/uL — AB (ref 4.0–10.5)
WBC: 13.2 10*3/uL — ABNORMAL HIGH (ref 4.0–10.5)

## 2015-03-12 LAB — COMPREHENSIVE METABOLIC PANEL
ALK PHOS: 153 U/L — AB (ref 38–126)
ALT: 42 U/L (ref 17–63)
ANION GAP: 10 (ref 5–15)
AST: 60 U/L — ABNORMAL HIGH (ref 15–41)
Albumin: 4.3 g/dL (ref 3.5–5.0)
BILIRUBIN TOTAL: 3.7 mg/dL — AB (ref 0.3–1.2)
BUN: 19 mg/dL (ref 6–20)
CHLORIDE: 109 mmol/L (ref 101–111)
CO2: 19 mmol/L — AB (ref 22–32)
Calcium: 9.2 mg/dL (ref 8.9–10.3)
Creatinine, Ser: 1.87 mg/dL — ABNORMAL HIGH (ref 0.61–1.24)
GFR calc non Af Amer: 46 mL/min — ABNORMAL LOW (ref >60)
GFR, EST AFRICAN AMERICAN: 53 mL/min — AB (ref >60)
GLUCOSE: 88 mg/dL (ref 65–99)
POTASSIUM: 5 mmol/L (ref 3.5–5.1)
SODIUM: 138 mmol/L (ref 135–145)
Total Protein: 7.4 g/dL (ref 6.5–8.1)

## 2015-03-12 LAB — RETICULOCYTES
RBC.: 1.95 MIL/uL — AB (ref 4.22–5.81)
RBC.: 1.9 MIL/uL — ABNORMAL LOW (ref 4.22–5.81)
RETIC CT PCT: 14.8 % — AB (ref 0.4–3.1)
RETIC CT PCT: 15.1 % — AB (ref 0.4–3.1)
Retic Count, Absolute: 288.6 10*3/uL — ABNORMAL HIGH (ref 19.0–186.0)
Retic Count, Absolute: 286.9 10*3/uL — ABNORMAL HIGH (ref 19.0–186.0)

## 2015-03-12 MED ORDER — OXYCODONE HCL 5 MG PO TABS
15.0000 mg | ORAL_TABLET | ORAL | Status: DC
  Administered 2015-03-12 – 2015-03-13 (×4): 15 mg via ORAL
  Filled 2015-03-12 (×6): qty 3

## 2015-03-12 MED ORDER — ENOXAPARIN SODIUM 40 MG/0.4ML ~~LOC~~ SOLN
40.0000 mg | SUBCUTANEOUS | Status: DC
  Administered 2015-03-12 – 2015-03-14 (×3): 40 mg via SUBCUTANEOUS
  Filled 2015-03-12 (×3): qty 0.4

## 2015-03-12 MED ORDER — HYDROCODONE-ACETAMINOPHEN 10-325 MG PO TABS
1.0000 | ORAL_TABLET | ORAL | Status: DC
  Administered 2015-03-12: 1 via ORAL
  Filled 2015-03-12: qty 1

## 2015-03-12 MED ORDER — OXYCODONE HCL 5 MG PO TABS: 5.0000 mg | ORAL_TABLET | ORAL | Status: DC | PRN

## 2015-03-12 MED ORDER — HYDROMORPHONE HCL 1 MG/ML IJ SOLN
1.0000 mg | INTRAMUSCULAR | Status: DC | PRN
  Administered 2015-03-12 – 2015-03-14 (×8): 1 mg via INTRAVENOUS
  Filled 2015-03-12 (×9): qty 1

## 2015-03-12 NOTE — Care Management Obs Status (Signed)
[place] NOTIFICATION   Patient Details  Name: [name] [name]. MRN: [id number] Date of Birth: 1980/09/29   Medicare Observation Status Notification Given:  Yes    Carles Collet, RN 03/12/2015, 3:06 PM

## 2015-03-12 NOTE — Progress Notes (Signed)
Family Medicine Teaching Service Daily Progress Note Intern Pager: [phone]  Patient name: [name] [name]. Medical record number: [id number] Date of birth: 05-29-1980 Age: 35 y.o. Gender: male  Primary Care Provider: [name], MD Consultants: None Code Status: Full  Pt Overview and Major Events to Date:  1/26: Admitted to FMTS for sickle cell pain crisis  Assessment and Plan: [name] [name]. is a 35 y.o. male presenting with pain in his arms, legs, and back for 3 days consistent with his typical sickle cell pain crisis. PMH is significant for sickle cell anemia, HTN,CKD, polysubstance abuse.   Sickle cell pain crisis: Hgb 9.3. Reticulocyte 10.7%. He refused his morning labs. Pain has not improved this morning. He says the Dilaudid isn't helping at all. Afebrile overnight with no O2 requirement. Received 27m Dilaudid in the last 24 hours. - Dilaudid 146mq2hrs PRN pain overnight. Pt has a morphine allergy. Will transition to scheduled OxyIR 1545m4hrs with Dilaudid 1mg5mhrs for breakthrough pain. - Will avoid Toradol given acute on chronic kidney disease - Senokot qhs, Miralax scheduled - Will monitor closely for acute chest. - D5-1/2NS @ 3/4 MIVF  - K pads  - continue home hydroxyurea - Incentive spirometry  Acute on chronic CKD, stage 3: Cr 2.32 in ED. Refused labs this morning. Baseline SCr appears to be around 1.1-1.5. - Holding home Enalapril  - Avoid nephrotoxic agents - Renally dose medications  Transaminitis: AST and ALT have intermittently been elevated. Alk phos elevated 158. His alk phos could be elevated secondary to bone infarcts. Currently less than a >2:1 ratio of AST:ALT. Viral hepatitis panel negative in 2015. GGT 08/2014: 35. - Follow LFTs  Hyperglycemia: Pt noted to have a glucose of 128 in the ED. He has had some elevated blood sugars on previous admissions. No HgbA1c on file. - Hemoglobin A1c pending  Migraine headache.Takes  Nortriptyline daily. - Continue home medication  Polysubstance abuse: Patient with long h/o substance abuse.  - UDS ordered - CIWA protocol  Social. Pt reports that he has 2 PCPs. He is seen at our clinic and he is seen by Dr. McKeAlyson Ingleso prescribes his pain medications.  - We have spoken with our clinic manager, who reassigned [name] to Dr. McKeAlyson InglesEN/GI: D5-1/2NS @ 60cc/hr, regular diet, miralax/senokot Prophylaxis: SQ Lovenox  Disposition: Home pending improvement in pain.  Subjective:  Pt states the Dilaudid is not helping his pain, it is just making him sleepy. He denies cough or shortness of breath.  Objective: Temp:  [98.3 F (36.8 C)-99.6 F (37.6 C)] 99.6 F (37.6 C) (01/27 0512) Pulse Rate:  [58-117] 66 (01/27 0515) Resp:  [11-20] 18 (01/27 0512) BP: (90-117)/(44-93) 107/49 mmHg (01/27 0515) SpO2:  [92 %-100 %] 93 % (01/27 0515) Weight:  [129 lb 15.7 oz (58.96 kg)-130 lb (58.968 kg)] 129 lb 15.7 oz (58.96 kg) (01/26 1742) Physical Exam: General: Tired-appearing, laying in bed, answers questions appropriately Eyes: Scleral icterus present, pinpoint pupils, EOMI, mild exotropia present ENTM: Oropharynx normal, MMM Neck: Supple, no lymphadenopathy Cardiovascular: RRR, no murmurs Respiratory: CTAB, normal work of breathing Abdomen: +BS, soft, non-tender, non-distended MSK: Tenderness to palpation of the thighs and calves, no edema Skin: No rashes or lesions, multiple tattoos Neuro: Sleepy, but awakens easily and answers questions appropriately; CN 2-12 grossly intact; moves all extremities Psych: Becomes angered easily when discussing narcotics, labile mood  Laboratory:  Recent Labs Lab 03/11/15 0832  WBC 10.9*  HGB 9.3*  HCT 26.1*  PLT 169    Recent Labs Lab 03/11/15 0832  NA 137  K 4.1  CL 103  CO2 22  BUN 17  CREATININE 2.32*  CALCIUM 9.0  PROT 7.6  BILITOT 4.4*  ALKPHOS 158*  ALT 39  AST 57*  GLUCOSE 128*   Reticulocytes  10.7%  Imaging/Diagnostic Tests: CXR: Scarring of right mid lung, no focal consolidations.  Sela Hua, MD 03/12/2015, 7:58 AM PGY-1, [place] Intern pager: [phone], text pages welcome

## 2015-03-12 NOTE — Care Management Note (Signed)
Case Management Note  Patient Details  Name: [name] [name]. MRN: [id number] Date of Birth: July 15, 1980  Subjective/Objective:                 Patient with SCC, in obs, anticipate DC tomorrow to home self care.   Action/Plan:   Expected Discharge Date:                  Expected Discharge Plan:  Home/Self Care  In-House Referral:     Discharge planning Services  CM Consult  Post Acute Care Choice:    Choice offered to:     DME Arranged:    DME Agency:     HH Arranged:    [place] Agency:     Status of Service:  Completed, signed off  Medicare Important Message Given:    Date Medicare IM Given:    Medicare IM give by:    Date Additional Medicare IM Given:    Additional Medicare Important Message give by:     If discussed at [place] of Stay Meetings, dates discussed:    Additional Comments:  Carles Collet, RN 03/12/2015, 3:05 PM

## 2015-03-13 DIAGNOSIS — F17210 Nicotine dependence, cigarettes, uncomplicated: Secondary | ICD-10-CM | POA: Diagnosis present

## 2015-03-13 DIAGNOSIS — Z885 Allergy status to narcotic agent status: Secondary | ICD-10-CM | POA: Diagnosis not present

## 2015-03-13 DIAGNOSIS — R739 Hyperglycemia, unspecified: Secondary | ICD-10-CM | POA: Diagnosis present

## 2015-03-13 DIAGNOSIS — R740 Nonspecific elevation of levels of transaminase and lactic acid dehydrogenase [LDH]: Secondary | ICD-10-CM | POA: Diagnosis present

## 2015-03-13 DIAGNOSIS — D5700 Hb-SS disease with crisis, unspecified: Secondary | ICD-10-CM | POA: Diagnosis not present

## 2015-03-13 DIAGNOSIS — F1290 Cannabis use, unspecified, uncomplicated: Secondary | ICD-10-CM | POA: Diagnosis present

## 2015-03-13 DIAGNOSIS — N183 Chronic kidney disease, stage 3 (moderate): Secondary | ICD-10-CM | POA: Diagnosis present

## 2015-03-13 DIAGNOSIS — F1910 Other psychoactive substance abuse, uncomplicated: Secondary | ICD-10-CM | POA: Diagnosis present

## 2015-03-13 DIAGNOSIS — G43909 Migraine, unspecified, not intractable, without status migrainosus: Secondary | ICD-10-CM | POA: Diagnosis present

## 2015-03-13 DIAGNOSIS — I129 Hypertensive chronic kidney disease with stage 1 through stage 4 chronic kidney disease, or unspecified chronic kidney disease: Secondary | ICD-10-CM | POA: Diagnosis present

## 2015-03-13 LAB — CBC
HEMATOCRIT: 22 % — AB (ref 39.0–52.0)
Hemoglobin: 8 g/dL — ABNORMAL LOW (ref 13.0–17.0)
MCH: 42.3 pg — ABNORMAL HIGH (ref 26.0–34.0)
MCHC: 36.4 g/dL — AB (ref 30.0–36.0)
MCV: 116.4 fL — AB (ref 78.0–100.0)
Platelets: 188 10*3/uL (ref 150–400)
RBC: 1.89 MIL/uL — ABNORMAL LOW (ref 4.22–5.81)
RDW: 22.7 % — AB (ref 11.5–15.5)
WBC: 11.3 10*3/uL — AB (ref 4.0–10.5)

## 2015-03-13 LAB — BASIC METABOLIC PANEL
ANION GAP: 11 (ref 5–15)
BUN: 16 mg/dL (ref 6–20)
CALCIUM: 9.3 mg/dL (ref 8.9–10.3)
CHLORIDE: 104 mmol/L (ref 101–111)
CO2: 20 mmol/L — AB (ref 22–32)
Creatinine, Ser: 1.67 mg/dL — ABNORMAL HIGH (ref 0.61–1.24)
GFR calc Af Amer: >60 mL/min (ref >60)
GFR calc non Af Amer: 53 mL/min — ABNORMAL LOW (ref >60)
GLUCOSE: 117 mg/dL — AB (ref 65–99)
Potassium: 4.5 mmol/L (ref 3.5–5.1)
Sodium: 135 mmol/L (ref 135–145)

## 2015-03-13 MED ORDER — OXYCODONE HCL 5 MG PO TABS
20.0000 mg | ORAL_TABLET | ORAL | Status: DC
  Administered 2015-03-13 – 2015-03-14 (×5): 20 mg via ORAL
  Filled 2015-03-13 (×6): qty 4

## 2015-03-13 NOTE — Progress Notes (Signed)
Family Medicine Teaching Service Daily Progress Note Intern Pager: [phone]  Patient name: [name] [name]. Medical record number: [id number] Date of birth: December 20, 1980 Age: 35 y.o. Gender: male  Primary Care Provider: [name], MD Consultants: None Code Status: Full  Pt Overview and Major Events to Date:  1/26: Admitted to FMTS for sickle cell pain crisis  Assessment and Plan: [name] [name]. is a 35 y.o. male presenting with pain in his arms, legs, and back for 3 days consistent with his typical sickle cell pain crisis. PMH is significant for sickle cell anemia, HTN,CKD, polysubstance abuse.   Sickle cell pain crisis: Hemoglobin and retics stable. No signs of acute chest. Refusing oxycodone because "it doesn't work." - OxyIR 55m q4hrs with OxyIR 557mq4hrs and Dilaudid 72m74m4hrs for breakthrough pain. - Will avoid Toradol given acute on chronic kidney disease - Senokot qhs, Miralax scheduled - Will monitor closely for acute chest. - K pads  - continue home hydroxyurea - Incentive spirometry  Acute on chronic CKD, stage 3: Cr 2.32 in ED. Baseline SCr appears to be around 1.1-1.5. Improving.  - Holding home Enalapril  - Avoid nephrotoxic agents - Renally dose medications  Transaminitis: AST and ALT have intermittently been elevated. Alk phos elevated 158. His alk phos could be elevated secondary to bone infarcts. Currently less than a >2:1 ratio of AST:ALT. Viral hepatitis panel negative in 2015. GGT 08/2014: 35. - Follow LFTs  Hyperglycemia: Pt noted to have a glucose of 128 in the ED. He has had some elevated blood sugars on previous admissions. No HgbA1c on file. - Hemoglobin A1c pending  Migraine headache.Takes Nortriptyline daily. - Continue home medication  Polysubstance abuse: Patient with long h/o substance abuse.  - UDS with opiates and THC - CIWA protocol  Social. Pt reports that he has 2 PCPs. He is seen at our clinic and he is seen by  Dr. McKAlyson Inglesho prescribes his pain medications.  - We have spoken with our clinic manager, who reassigned Mr. Gruwell to Dr. McKAlyson InglesFEN/GI: SLIV, regular diet, miralax/senokot Prophylaxis: SQ Lovenox  Disposition: Home pending improvement in pain.  Subjective:  Refused oxycodone and all pain medications overnight because they "don't help with the pain." No fevers or chills. No shortness of breath.   Objective: Temp:  [98.5 F (36.9 C)-99.6 F (37.6 C)] 99.6 F (37.6 C) (01/28 0448) Pulse Rate:  [89-102] 93 (01/28 0448) Resp:  [19-20] 19 (01/27 2005) BP: (103-121)/(60-70) 111/62 mmHg (01/28 0448) SpO2:  [93 %-97 %] 97 % (01/28 0448) Physical Exam: General: Tired-appearing, laying in bed, answers questions appropriately Eyes: Scleral icterus present, pinpoint pupils, EOMI, mild exotropia present ENTM: Oropharynx normal, MMM Neck: Supple, no lymphadenopathy Cardiovascular: RRR, no murmurs Respiratory: CTAB, normal work of breathing Abdomen: +BS, soft, non-tender, non-distended MSK: Tenderness to palpation of the thighs and calves, no edema Skin: No rashes or lesions, multiple tattoos Neuro: Answers questions appropriately; CN 2-12 grossly intact; moves all extremities Psych: Becomes angered easily when discussing narcotics, labile mood  Laboratory:  Recent Labs Lab 03/12/15 1246 03/12/15 1853 03/13/15 0543  WBC 12.2* 13.2* 11.3*  HGB 8.2* 8.0* 8.0*  HCT 23.2* 22.6* 22.0*  PLT 166 145* 188    Recent Labs Lab 03/11/15 0832 03/12/15 1246 03/13/15 0543  NA 137 138 135  K 4.1 5.0 4.5  CL 103 109 104  CO2 22 19* 20*  BUN _0 CREATININE 2.32* 1.87* 1.67*  CALCIUM 9.0 9.2 9.3  PROT  7.6 7.4  --   BILITOT 4.4* 3.7*  --   ALKPHOS 158* 153*  --   ALT 39 42  --   AST 57* 60*  --   GLUCOSE 128* 88 117*   Imaging/Diagnostic Tests: CXR: Scarring of right mid lung, no focal consolidations.  Vivi Barrack, MD 03/13/2015, 8:47 AM PGY-2, [place] Intern pager: [phone], text pages welcome

## 2015-03-14 LAB — CBC
HEMATOCRIT: 20.4 % — AB (ref 39.0–52.0)
HEMOGLOBIN: 7.5 g/dL — AB (ref 13.0–17.0)
MCH: 42.4 pg — ABNORMAL HIGH (ref 26.0–34.0)
MCHC: 36.8 g/dL — AB (ref 30.0–36.0)
MCV: 115.3 fL — AB (ref 78.0–100.0)
Platelets: 170 10*3/uL (ref 150–400)
RBC: 1.77 MIL/uL — ABNORMAL LOW (ref 4.22–5.81)
RDW: 23.1 % — ABNORMAL HIGH (ref 11.5–15.5)
WBC: 10.4 10*3/uL (ref 4.0–10.5)

## 2015-03-14 LAB — HEPATIC FUNCTION PANEL
ALBUMIN: 4 g/dL (ref 3.5–5.0)
ALT: 42 U/L (ref 17–63)
AST: 57 U/L — AB (ref 15–41)
Alkaline Phosphatase: 148 U/L — ABNORMAL HIGH (ref 38–126)
BILIRUBIN TOTAL: 3.8 mg/dL — AB (ref 0.3–1.2)
Bilirubin, Direct: 0.4 mg/dL (ref 0.1–0.5)
Indirect Bilirubin: 3.4 mg/dL — ABNORMAL HIGH (ref 0.3–0.9)
Total Protein: 7.4 g/dL (ref 6.5–8.1)

## 2015-03-14 LAB — BASIC METABOLIC PANEL
Anion gap: 10 (ref 5–15)
BUN: 19 mg/dL (ref 6–20)
CHLORIDE: 105 mmol/L (ref 101–111)
CO2: 21 mmol/L — AB (ref 22–32)
CREATININE: 1.79 mg/dL — AB (ref 0.61–1.24)
Calcium: 9.1 mg/dL (ref 8.9–10.3)
GFR calc non Af Amer: 49 mL/min — ABNORMAL LOW (ref >60)
GFR, EST AFRICAN AMERICAN: 56 mL/min — AB (ref >60)
GLUCOSE: 96 mg/dL (ref 65–99)
Potassium: 4.7 mmol/L (ref 3.5–5.1)
Sodium: 136 mmol/L (ref 135–145)

## 2015-03-14 LAB — RETICULOCYTES
RBC.: 1.77 MIL/uL — AB (ref 4.22–5.81)
RETIC COUNT ABSOLUTE: 215.9 10*3/uL — AB (ref 19.0–186.0)
Retic Ct Pct: 12.2 % — ABNORMAL HIGH (ref 0.4–3.1)

## 2015-03-14 MED ORDER — OXYCODONE HCL 5 MG PO TABS
20.0000 mg | ORAL_TABLET | ORAL | Status: DC | PRN
  Administered 2015-03-14 – 2015-03-15 (×3): 20 mg via ORAL
  Filled 2015-03-14 (×3): qty 4

## 2015-03-14 NOTE — Progress Notes (Signed)
Family Medicine Teaching Service Daily Progress Note Intern Pager: [phone]  Patient name: [name] [name]. Medical record number: [id number] Date of birth: [birth year] Age: 35 y.o. Gender: male  Primary Care Provider: [name], MD Consultants: None Code Status: Full  Pt Overview and Major Events to Date:  1/26: Admitted to FMTS for sickle cell pain crisis  Assessment and Plan: [name] [name]. is a 35 y.o. male presenting with pain in his arms, legs, and back for 3 days consistent with his typical sickle cell pain crisis. PMH is significant for sickle cell anemia, HTN,CKD, polysubstance abuse.   Sickle cell pain crisis: Hemoglobin and retics stable. No signs of acute chest. Hgb 7.5 this AM. Reticulocyte count decreasing from 15.1 > 12.2. He states he is only having a little bit of pain this morning. - OxyIR 30m q4hrs and Dilaudid 185mq4hrs for breakthrough pain. - Will avoid Toradol given acute on chronic kidney disease - Senokot qhs, Miralax scheduled - Will monitor closely for acute chest. - K pads  - continue home hydroxyurea - Incentive spirometry  Acute on chronic CKD, stage 3: Cr 2.32 in ED. Baseline SCr appears to be around 1.1-1.5. Improving. Creatinine stable at 1.79. - Holding home Enalapril  - Avoid nephrotoxic agents - Renally dose medications  Transaminitis: AST and ALT have intermittently been elevated. Alk phos elevated 158. His alk phos could be elevated secondary to bone infarcts. Currently less than a >2:1 ratio of AST:ALT. Viral hepatitis panel negative in 2015. GGT 08/2014: 35. - Follow LFTs  Hyperglycemia: Pt noted to have a glucose of 128 in the ED. He has had some elevated blood sugars on previous admissions. No HgbA1c on file. - Hemoglobin A1c pending  Migraine headache.Takes Nortriptyline daily. - Continue home medication  Polysubstance abuse: Patient with long h/o substance abuse.  - UDS with opiates and THC - CIWA  protocol  Social. Pt reports that he has 2 PCPs. He is seen at our clinic and he is seen by Dr. McAlyson Ingleswho prescribes his pain medications.  - We have spoken with our clinic manager, who reassigned [name] to Dr. McAlyson Ingles FEN/GI: SLIV, regular diet, miralax/senokot Prophylaxis: SQ Lovenox  Disposition: Anticipate discharge home on 1/30.  Subjective:  Pt states he is only having a little bit of pain this morning. He thinks he will be ready for discharge tomorrow. Last night, he refused telemetry so we discontinued cardiac monitoring.  Objective: Temp:  [97.6 F (36.4 C)-99.2 F (37.3 C)] 97.6 F (36.4 C) (01/29 0519) Pulse Rate:  [76-85] 85 (01/29 0519) Resp:  [16-23] 16 (01/29 0519) BP: (98-117)/(53-58) 98/58 mmHg (01/29 0519) SpO2:  [97 %-100 %] 99 % (01/29 0519) Physical Exam: General: Tired-appearing, laying in bed, answers questions appropriately HEENT: [place]/AT, EOMI, MMM Neck: Supple, no lymphadenopathy Cardiovascular: RRR, no murmurs Respiratory: CTAB, normal work of breathing Abdomen: +BS, soft, non-tender, non-distended MSK: Tenderness to palpation of the thighs and calves improved from previous exams, no edema Skin: No rashes or lesions, multiple tattoos Neuro: Answers questions appropriately; CN 2-12 grossly intact; moves all extremities Psych: Appropriate affect, normal behavior  Laboratory:  Recent Labs Lab 03/12/15 1246 03/12/15 1853 03/13/15 0543  WBC 12.2* 13.2* 11.3*  HGB 8.2* 8.0* 8.0*  HCT 23.2* 22.6* 22.0*  PLT 166 145* 188    Recent Labs Lab 03/11/15 0832 03/12/15 1246 03/13/15 0543  NA 137 138 135  K 4.1 5.0 4.5  CL 103 109 104  CO2 22 19* 20*  BUN '17 19 16  ' CREATININE 2.32* 1.87* 1.67*  CALCIUM 9.0 9.2 9.3  PROT 7.6 7.4  --   BILITOT 4.4* 3.7*  --   ALKPHOS 158* 153*  --   ALT 39 42  --   AST 57* 60*  --   GLUCOSE 128* 88 117*   Imaging/Diagnostic Tests: CXR: Scarring of right mid lung, no focal consolidations.  Sela Hua, MD 03/14/2015, 8:17 AM PGY-1, [place] Intern pager: [phone], text pages welcome

## 2015-03-15 ENCOUNTER — Telehealth: Payer: Self-pay | Admitting: *Deleted

## 2015-03-15 MED ORDER — OXYCODONE HCL 20 MG PO TABS: 20.0000 mg | ORAL_TABLET | ORAL | Status: DC | PRN

## 2015-03-15 MED ORDER — OXYCODONE HCL 5 MG PO TABS: 5.0000 mg | ORAL_TABLET | ORAL | Status: DC | PRN

## 2015-03-15 NOTE — Progress Notes (Signed)
Pt refused bedtime vitals. Will continue to monitor.

## 2015-03-15 NOTE — Telephone Encounter (Signed)
Pharmacist with [facility] called needing provider to give them a call regarding Rx received today for the oxycodone 5 mg #18.  Patient had  oxycodone-acetaminophen 10-325 mg dispensed #90 written by Dr. Alyson Ingles.  Please let them know if oxycodone 5 mg is being used in conjunction with the oxycodone-acetaminophen 10-325 .  Please call [phone].  Derl Barrow, RN

## 2015-03-15 NOTE — Clinical Documentation Improvement (Signed)
Family Medicine Teaching Service  (Query responses must be documented in the current medical record, not on the CDI BPA form.)  Please document if a condition below provides greater specificity regarding the diagnosis of "Acute on chronic CKD, stage 3: Cr. 2.32 in ED" documented in the H&P and subsequent progress notes.   - Acute Kidney Injury superimposed on Stage 3 CKD  - Other condition  - Unable to clinically determine  Please exercise your independent, professional judgment when responding. A specific answer is not anticipated or expected.   Thank You, Erling Conte  RN BSN CCDS [phone] Health Information Management [place]

## 2015-03-15 NOTE — Progress Notes (Signed)
Pt c/o pain with score of 4/10. RN notified MD because pt has only severe pain medication prn ordered. Dr. Brett Albino returned call requesting RN give oxycodone IR 20mg . RN administered medication as ordered verbally by Dr. Brett Albino. Will continue to monitor.

## 2015-03-15 NOTE — Discharge Instructions (Signed)
You were hospitalized for a sickle cell pain crisis. We treated your pain and you improved. We will discharge you home with a few Oxycodone tablets. Please continue to take your Percocet at home as you normally would, and use the Oxycodone 5mg  every 4 hours as needed for breakthrough pain.   Please schedule a hospital follow-up appointment with your primary care provider, Dr. Alyson Ingles, as soon as possible.

## 2015-03-15 NOTE — Progress Notes (Signed)
Family Medicine Teaching Service Daily Progress Note Intern Pager: [phone]  Patient name: [name] [name]. Medical record number: [id number] Date of birth: 04-10-1980 Age: 35 y.o. Gender: male  Primary Care Provider: [name], MD Consultants: None Code Status: Full  Pt Overview and Major Events to Date:  1/26: Admitted to FMTS for sickle cell pain crisis  Assessment and Plan: [name] [name]. is a 35 y.o. male presenting with pain in his arms, legs, and back for 3 days consistent with his typical sickle cell pain crisis. PMH is significant for sickle cell anemia, HTN,CKD, polysubstance abuse.   Sickle cell pain crisis: Improved. Hemoglobin and retics stable. No signs of acute chest. He states his pain is much better this morning.  - OxyIR 42m q4hrs prn and OxyIR 557mq4hrs prn for breakthrough pain. - Will avoid Toradol given acute on chronic kidney disease - Senokot qhs, Miralax scheduled - Will monitor closely for acute chest. - K pads  - continue home hydroxyurea - Incentive spirometry  Acute on chronic CKD, stage 3: Cr 2.32 in ED. Baseline SCr appears to be around 1.1-1.5. Improving. Creatinine stable at 1.79 yesterday.  - Holding home Enalapril  - Avoid nephrotoxic agents - Renally dose medications  Transaminitis: AST and ALT have intermittently been elevated. Alk phos elevated 158. His alk phos could be elevated secondary to bone infarcts. Currently less than a >2:1 ratio of AST:ALT. Viral hepatitis panel negative in 2015. GGT 08/2014: 35. - Follow LFTs  Hyperglycemia: Pt noted to have a glucose of 128 in the ED. He has had some elevated blood sugars on previous admissions. No HgbA1c on file. - Hemoglobin A1c pending  Migraine headache.Takes Nortriptyline daily. - Continue home medication  Polysubstance abuse: Patient with long h/o substance abuse.  - UDS with opiates and THC - CIWA protocol  Social. Pt reports that he has 2 PCPs. He is  seen at our clinic and he is seen by Dr. McAlyson Ingleswho prescribes his pain medications.  - We have spoken with our clinic manager, who reassigned [name] [name] to [name] [name] FEN/GI: SLIV, regular diet, miralax/senokot Prophylaxis: SQ Lovenox  Disposition: Discharge home today.  Subjective:  Pt states he is feeling better this morning. He would like to go home today.  Objective: Temp:  [97.7 F (36.5 C)-99.4 F (37.4 C)] 99.4 F (37.4 C) (01/30 0428) Pulse Rate:  [86-119] 86 (01/30 0428) Resp:  [15-23] 15 (01/30 0428) BP: (92-105)/(51-78) 92/51 mmHg (01/30 0428) SpO2:  [98 %-99 %] 99 % (01/30 0428) Physical Exam: General: Tired-appearing, laying in bed, answers questions appropriately HEENT: [place]/AT, EOMI, MMM Neck: Supple, no lymphadenopathy Cardiovascular: RRR, no murmurs Respiratory: CTAB, normal work of breathing Abdomen: +BS, soft, non-tender, non-distended MSK: Tenderness to palpation of the thighs and calves improved from previous exams, no edema Skin: No rashes or lesions, multiple tattoos Neuro: Answers questions appropriately; CN 2-12 grossly intact; moves all extremities Psych: Appropriate affect, normal behavior  Laboratory:  Recent Labs Lab 03/12/15 1853 03/13/15 0543 03/14/15 0650  WBC 13.2* 11.3* 10.4  HGB 8.0* 8.0* 7.5*  HCT 22.6* 22.0* 20.4*  PLT 145* 188 170    Recent Labs Lab 03/11/15 0832 03/12/15 1246 03/13/15 0543 03/14/15 0650  NA 137 138 135 136  K 4.1 5.0 4.5 4.7  CL 103 109 104 105  CO2 22 19* 20* 21*  BUN '17 19 16 19  ' CREATININE 2.32* 1.87* 1.67* 1.79*  CALCIUM 9.0 9.2 9.3 9.1  PROT 7.6 7.4  --  7.4  BILITOT 4.4* 3.7*  --  3.8*  ALKPHOS 158* 153*  --  148*  ALT 39 42  --  42  AST 57* 60*  --  57*  GLUCOSE 128* 88 117* 96   Imaging/Diagnostic Tests: CXR: Scarring of right mid lung, no focal consolidations.  Sela Hua, MD 03/15/2015, 8:05 AM PGY-1, [place] Intern pager: [phone], text pages  welcome

## 2015-03-15 NOTE — Care Management Important Message (Signed)
Important Message  Patient Details  Name: [name] [name]. MRN: [id number] Date of Birth: 1980/03/08   Medicare Important Message Given:  Yes    [name] May 03/15/2015, 2:11 PM

## 2015-03-16 LAB — HEMOGLOBIN A1C

## 2015-03-16 NOTE — Discharge Summary (Signed)
[facility] Discharge Summary  Patient name: Rhyatt Muska. Medical record number: [id number] Date of birth: 1980/03/12 Age: 35 y.o. Gender: male Date of Admission: 03/11/2015  Date of Discharge: 03/15/15 Admitting Physician: Blane Ohara McDiarmid, MD  Primary Care Provider: [name], MD Consultants: None  Indication for Hospitalization: Sickle cell pain crisis  Discharge Diagnoses/Problem List:  Sickle cell anemia Acute on chronic kidney injury, resolved Tobacco abuse Polysubstance abuse  Disposition: Home  Discharge Condition: Stable, improved  Discharge Exam:  General: Tired-appearing, laying in bed, answers questions appropriately HEENT: [place]/AT, EOMI, MMM Neck: Supple, no lymphadenopathy Cardiovascular: RRR, no murmurs Respiratory: CTAB, normal work of breathing Abdomen: +BS, soft, non-tender, non-distended MSK: Tenderness to palpation of the thighs and calves improved from previous exams, no edema Skin: No rashes or lesions, multiple tattoos Neuro: Answers questions appropriately; CN 2-12 grossly intact; moves all extremities Psych: Appropriate affect, normal behavior  Brief Hospital Course:  Kala Gassmann is a 35 year old male who presented to the ED with sickle cell pain crisis in his lower extremities, upper extremities, lower back, hands, and arms. In the ED, his creatinine was elevated to 2.32, alk phos 158, AST 57, bilirubin 4.4, WBC 10.9, Hgb 9.3 (baseline ~8), reticulocyte count 10.7%. CXR was negative for infiltrates and he was afebrile. He was given multiple doses of Dilaudid, but continued to be in pain. He was admitted for further management.  We treated his pain with Dilaudid 12m q2hrs prn, but he stated it wasn't helping. We transitioned him to OxyIR 168mq4hrs with Dilaudid 80m13m4hrs prn for breakthrough pain. He continued to endorse pain, so we increased his OxyIR to 70m2mhrs. We continued his home Hydroxyurea. We  trended his CBC and reticulocyte count, which were stable throughout admission. On the day of discharge, he stated that his pain had much improved. He was discharged home with OxyIR 5mg 56mrs prn to take for breakthrough pain in addition to his home Percocet.   He was noted to have an acute on chronic kidney injury on admission. His creatinine was 2.32 (baseline ~1.1-1.5). We hydrated him with 3/4MIVFs. We held his home Enalapril in the setting of AKI. On the day of discharge, his creatinine improved to 1.79. His home Enalapril was restarted on discharge.   On admission, Pt stated that he has 2 PCPs. He gets his narcotic medications from his other PCP, Dr. McKenAlyson Inglesause he is not willing to sign our clinic's pain contract. We have reassigned him to Dr. McKenAlyson Ingleswas encouraged to follow-up with Dr. McKenAlyson Inglesischarge.   Issues for Follow Up:  1. None  Significant Procedures: None  Significant Labs and Imaging:   Recent Labs Lab 03/12/15 1853 03/13/15 0543 03/14/15 0650  WBC 13.2* 11.3* 10.4  HGB 8.0* 8.0* 7.5*  HCT 22.6* 22.0* 20.4*  PLT 145* 188 170    Recent Labs Lab 03/11/15 0832 03/12/15 1246 03/13/15 0543 03/14/15 0650  NA 137 138 135 136  K 4.1 5.0 4.5 4.7  CL 103 109 104 105  CO2 22 19* 20* 21*  GLUCOSE 128* 88 117* 96  BUN '17 19 16 19  ' CREATININE 2.32* 1.87* 1.67* 1.79*  CALCIUM 9.0 9.2 9.3 9.1  ALKPHOS 158* 153*  --  148*  AST 57* 60*  --  57*  ALT 39 42  --  42  ALBUMIN 4.3 4.3  --  4.0   Reticulocytes: 10.7% > 14.8% > 15.1% > 12.2% Hemoglobin A1c: <4.0% CXR:  Scarring of right mid lung, no focal consolidations.  Results/Tests Pending at Time of Discharge: None  Discharge Medications:    Medication List    TAKE these medications        enalapril 5 MG tablet  Commonly known as:  VASOTEC  Take 1 tablet (5 mg total) by mouth daily.     hydroxyurea 500 MG capsule  Commonly known as:  HYDREA  TAKE 3 CAPSULES (1,500 MG TOTAL) BY MOUTH DAILY.      nortriptyline 25 MG capsule  Commonly known as:  PAMELOR  TAKE 1 CAPSULE (25 MG TOTAL) BY MOUTH AT BEDTIME.     oxyCODONE 5 MG immediate release tablet  Commonly known as:  Oxy IR/ROXICODONE  Take 1 tablet (5 mg total) by mouth every 4 (four) hours as needed for breakthrough pain.     oxyCODONE-acetaminophen 10-325 MG tablet  Commonly known as:  PERCOCET  Take 1 tablet by mouth every 8 (eight) hours as needed for pain.        Discharge Instructions: Please refer to Patient Instructions section of EMR for full details.  Patient was counseled important signs and symptoms that should prompt return to medical care, changes in medications, dietary instructions, activity restrictions, and follow up appointments.   Follow-Up Appointments:     Follow-up Information    Follow up with [name], MD.   Specialty:  Family Medicine   Why:  Please call ASAP for a hospital follow-up appointment   Contact information:   [place] [phone]       Sela Hua, MD 03/16/2015, 3:33 PM PGY-1, [place]

## 2015-03-17 NOTE — ED Provider Notes (Signed)
CSN: [id number]     Arrival date & time 03/11/15  0800 History   First MD Initiated Contact with Patient 03/11/15 [phone]     Chief Complaint  Patient presents with  . Sickle Cell Pain Crisis      HPI   presents for evaluation of sickle cell pain. History of sickle cell anemia. He says back and leg pain. No chest pain. No fevers chills no shortness of breath. Typical symptoms. Been taking his home medications without relief presents here. Symptoms ongoing for 24-48 hours.  Past Medical History  Diagnosis Date  . Sickle cell anemia (HCC)   . Sickle cell crisis ([place]) 09/25/2012  . Bacterial pneumonia ~ 2012    "caught it here in the hospital" (09/25/2012)  . History of blood transfusion     "always related to sickle cell crisis" (09/25/2012)  . GERD (gastroesophageal reflux disease)     "after I eat alot of spicey foods" (09/25/2012)  . Migraines     "take RX qd to prevent them" (09/25/2012)  . Cyst of brain     "2 really small ones in the back of my head; inside; saw them w/MRI" (09/25/2012)  . Chronic kidney disease     "from my sickle cell" (09/25/2012)   Past Surgical History  Procedure Laterality Date  . Cholecystectomy  ~ 2012   Family History  Problem Relation Age of Onset  . Breast cancer Mother    Social History  Substance Use Topics  . Smoking status: Current Some Day Smoker    Types: Cigarettes  . Smokeless tobacco: Never Used     Comment: 09/25/2012 "I don't buy cigarettes; bum one from friends q now and then"  . Alcohol Use: 0.0 oz/week    0 Standard drinks or equivalent per week     Comment: occasionally    Review of Systems  Constitutional: Negative for fever, chills, diaphoresis, appetite change and fatigue.  HENT: Negative for mouth sores, sore throat and trouble swallowing.   Eyes: Negative for visual disturbance.  Respiratory: Negative for cough, chest tightness, shortness of breath and wheezing.   Cardiovascular: Negative for chest pain.   Gastrointestinal: Negative for nausea, vomiting, abdominal pain, diarrhea and abdominal distention.  Endocrine: Negative for polydipsia, polyphagia and polyuria.  Genitourinary: Negative for dysuria, frequency and hematuria.  Musculoskeletal: Positive for myalgias and arthralgias. Negative for gait problem.  Skin: Negative for color change, pallor and rash.  Neurological: Negative for dizziness, syncope, light-headedness and headaches.  Hematological: Does not bruise/bleed easily.  Psychiatric/Behavioral: Negative for behavioral problems and confusion.      Allergies  Morphine and related  Home Medications   Prior to Admission medications   Medication Sig Start Date End Date Taking? Authorizing Provider  enalapril (VASOTEC) 5 MG tablet Take 1 tablet (5 mg total) by mouth daily. 05/13/14  Yes Leone Brand, MD  hydroxyurea (HYDREA) 500 MG capsule TAKE 3 CAPSULES (1,500 MG TOTAL) BY MOUTH DAILY. 12/26/14  Yes Donnamae Jude, MD  nortriptyline (PAMELOR) 25 MG capsule TAKE 1 CAPSULE (25 MG TOTAL) BY MOUTH AT BEDTIME. 12/09/14  Yes Donnamae Jude, MD  oxyCODONE-acetaminophen (PERCOCET) 10-325 MG tablet Take 1 tablet by mouth every 8 (eight) hours as needed for pain.  02/25/15  Yes Historical Provider, MD  oxyCODONE (OXY IR/ROXICODONE) 5 MG immediate release tablet Take 1 tablet (5 mg total) by mouth every 4 (four) hours as needed for breakthrough pain. 03/15/15   Sela Hua, MD   BP 412 037 4241  mmHg  Pulse 86  Temp(Src) 99.4 F (37.4 C) (Oral)  Resp 15  Ht 5\' 4"  (1.626 m)  Wt 129 lb 15.7 oz (58.96 kg)  BMI 22.30 kg/m2  SpO2 99% Physical Exam  Constitutional: He is oriented to person, place, and time. He appears well-developed and well-nourished. No distress.  HENT:  Head: Normocephalic.  Eyes: Conjunctivae are normal. Pupils are equal, round, and reactive to light. No scleral icterus.  Neck: Normal range of motion. Neck supple. No thyromegaly present.  Cardiovascular: Normal rate and  regular rhythm.  Exam reveals no gallop and no friction rub.   No murmur heard. Pulmonary/Chest: Effort normal and breath sounds normal. No respiratory distress. He has no wheezes. He has no rales.  Abdominal: Soft. Bowel sounds are normal. He exhibits no distension. There is no tenderness. There is no rebound.  Musculoskeletal: Normal range of motion.  Neurological: He is alert and oriented to person, place, and time.  Skin: Skin is warm and dry. No rash noted.  Psychiatric: He has a normal mood and affect. His behavior is normal.    ED Course  Procedures (including critical care time) Labs Review Labs Reviewed  COMPREHENSIVE METABOLIC PANEL - Abnormal; Notable for the following:    Glucose, Bld 128 (*)    Creatinine, Ser 2.32 (*)    AST 57 (*)    Alkaline Phosphatase 158 (*)    Total Bilirubin 4.4 (*)    GFR calc non Af Amer 35 (*)    GFR calc Af Amer 41 (*)    All other components within normal limits  CBC WITH DIFFERENTIAL/PLATELET - Abnormal; Notable for the following:    WBC 10.9 (*)    RBC 2.15 (*)    Hemoglobin 9.3 (*)    HCT 26.1 (*)    MCV 121.4 (*)    MCH 43.3 (*)    RDW 20.3 (*)    Monocytes Absolute 1.3 (*)    All other components within normal limits  RETICULOCYTES - Abnormal; Notable for the following:    Retic Ct Pct 10.7 (*)    RBC. 2.13 (*)    Retic Count, Manual 227.9 (*)    All other components within normal limits  URINE RAPID DRUG SCREEN, HOSP PERFORMED - Abnormal; Notable for the following:    Opiates POSITIVE (*)    Tetrahydrocannabinol POSITIVE (*)    All other components within normal limits  HEMOGLOBIN A1C - Abnormal; Notable for the following:    Hgb A1c MFr Bld <4.0 (*)    All other components within normal limits  CBC - Abnormal; Notable for the following:    WBC 12.2 (*)    RBC 1.95 (*)    Hemoglobin 8.2 (*)    HCT 23.2 (*)    MCV 119.0 (*)    MCH 42.1 (*)    RDW 23.0 (*)    All other components within normal limits  RETICULOCYTES -  Abnormal; Notable for the following:    Retic Ct Pct 14.8 (*)    RBC. 1.95 (*)    Retic Count, Manual 288.6 (*)    All other components within normal limits  COMPREHENSIVE METABOLIC PANEL - Abnormal; Notable for the following:    CO2 19 (*)    Creatinine, Ser 1.87 (*)    AST 60 (*)    Alkaline Phosphatase 153 (*)    Total Bilirubin 3.7 (*)    GFR calc non Af Amer 46 (*)    GFR calc Af Wyvonnia Lora  53 (*)    All other components within normal limits  CBC - Abnormal; Notable for the following:    WBC 13.2 (*)    RBC 1.90 (*)    Hemoglobin 8.0 (*)    HCT 22.6 (*)    MCV 118.9 (*)    MCH 42.1 (*)    RDW 22.8 (*)    Platelets 145 (*)    All other components within normal limits  RETICULOCYTES - Abnormal; Notable for the following:    Retic Ct Pct 15.1 (*)    RBC. 1.90 (*)    Retic Count, Manual 286.9 (*)    All other components within normal limits  CBC - Abnormal; Notable for the following:    WBC 11.3 (*)    RBC 1.89 (*)    Hemoglobin 8.0 (*)    HCT 22.0 (*)    MCV 116.4 (*)    MCH 42.3 (*)    MCHC 36.4 (*)    RDW 22.7 (*)    All other components within normal limits  BASIC METABOLIC PANEL - Abnormal; Notable for the following:    CO2 20 (*)    Glucose, Bld 117 (*)    Creatinine, Ser 1.67 (*)    GFR calc non Af Amer 53 (*)    All other components within normal limits  CBC - Abnormal; Notable for the following:    RBC 1.77 (*)    Hemoglobin 7.5 (*)    HCT 20.4 (*)    MCV 115.3 (*)    MCH 42.4 (*)    MCHC 36.8 (*)    RDW 23.1 (*)    All other components within normal limits  BASIC METABOLIC PANEL - Abnormal; Notable for the following:    CO2 21 (*)    Creatinine, Ser 1.79 (*)    GFR calc non Af Amer 49 (*)    GFR calc Af Amer 56 (*)    All other components within normal limits  RETICULOCYTES - Abnormal; Notable for the following:    Retic Ct Pct 12.2 (*)    RBC. 1.77 (*)    Retic Count, Manual 215.9 (*)    All other components within normal limits  HEPATIC FUNCTION  PANEL - Abnormal; Notable for the following:    AST 57 (*)    Alkaline Phosphatase 148 (*)    Total Bilirubin 3.8 (*)    Indirect Bilirubin 3.4 (*)    All other components within normal limits    Imaging Review No results found. I have personally reviewed and evaluated these images and lab results as part of my medical decision-making.   EKG Interpretation   Date/Time:  Thursday March 11 2015 08:12:55 EST Ventricular Rate:  107 PR Interval:  152 QRS Duration: 80 QT Interval:  300 QTC Calculation: 400 R Axis:   82 Text Interpretation:  Sinus tachycardia Nonspecific T wave abnormality  Abnormal ECG Confirmed by COOK  MD, BRIAN ([id number]) on 03/11/2015 4:36:20 PM      MDM   Final diagnoses:  Sickle cell anemia with crisis ([place])    Stable labs. Symptoms not controlled with IV pain meds despite escalating doses. Will be admitted to family medicine service for further care    Tanna Furry, MD 03/17/15 [phone]

## 2015-03-29 DIAGNOSIS — D57219 Sickle-cell/Hb-C disease with crisis, unspecified: Secondary | ICD-10-CM | POA: Diagnosis not present

## 2015-03-29 DIAGNOSIS — Z79891 Long term (current) use of opiate analgesic: Secondary | ICD-10-CM | POA: Diagnosis not present

## 2015-03-29 DIAGNOSIS — F17200 Nicotine dependence, unspecified, uncomplicated: Secondary | ICD-10-CM | POA: Diagnosis not present

## 2015-04-22 DIAGNOSIS — F17200 Nicotine dependence, unspecified, uncomplicated: Secondary | ICD-10-CM | POA: Diagnosis not present

## 2015-04-22 DIAGNOSIS — D57219 Sickle-cell/Hb-C disease with crisis, unspecified: Secondary | ICD-10-CM | POA: Diagnosis not present

## 2015-05-26 DIAGNOSIS — F17200 Nicotine dependence, unspecified, uncomplicated: Secondary | ICD-10-CM | POA: Diagnosis not present

## 2015-05-26 DIAGNOSIS — D57219 Sickle-cell/Hb-C disease with crisis, unspecified: Secondary | ICD-10-CM | POA: Diagnosis not present

## 2015-06-02 IMAGING — CR DG CHEST 2V
2 series · 2 of 2 positions shown · non-contrast
Comparison: PA and lateral chest 07/28/2013, 05/09/2013 and
09/25/2012.

CLINICAL DATA: Dyspnea for 2 days.

EXAM:
CHEST  2 VIEW

[w chest pa]
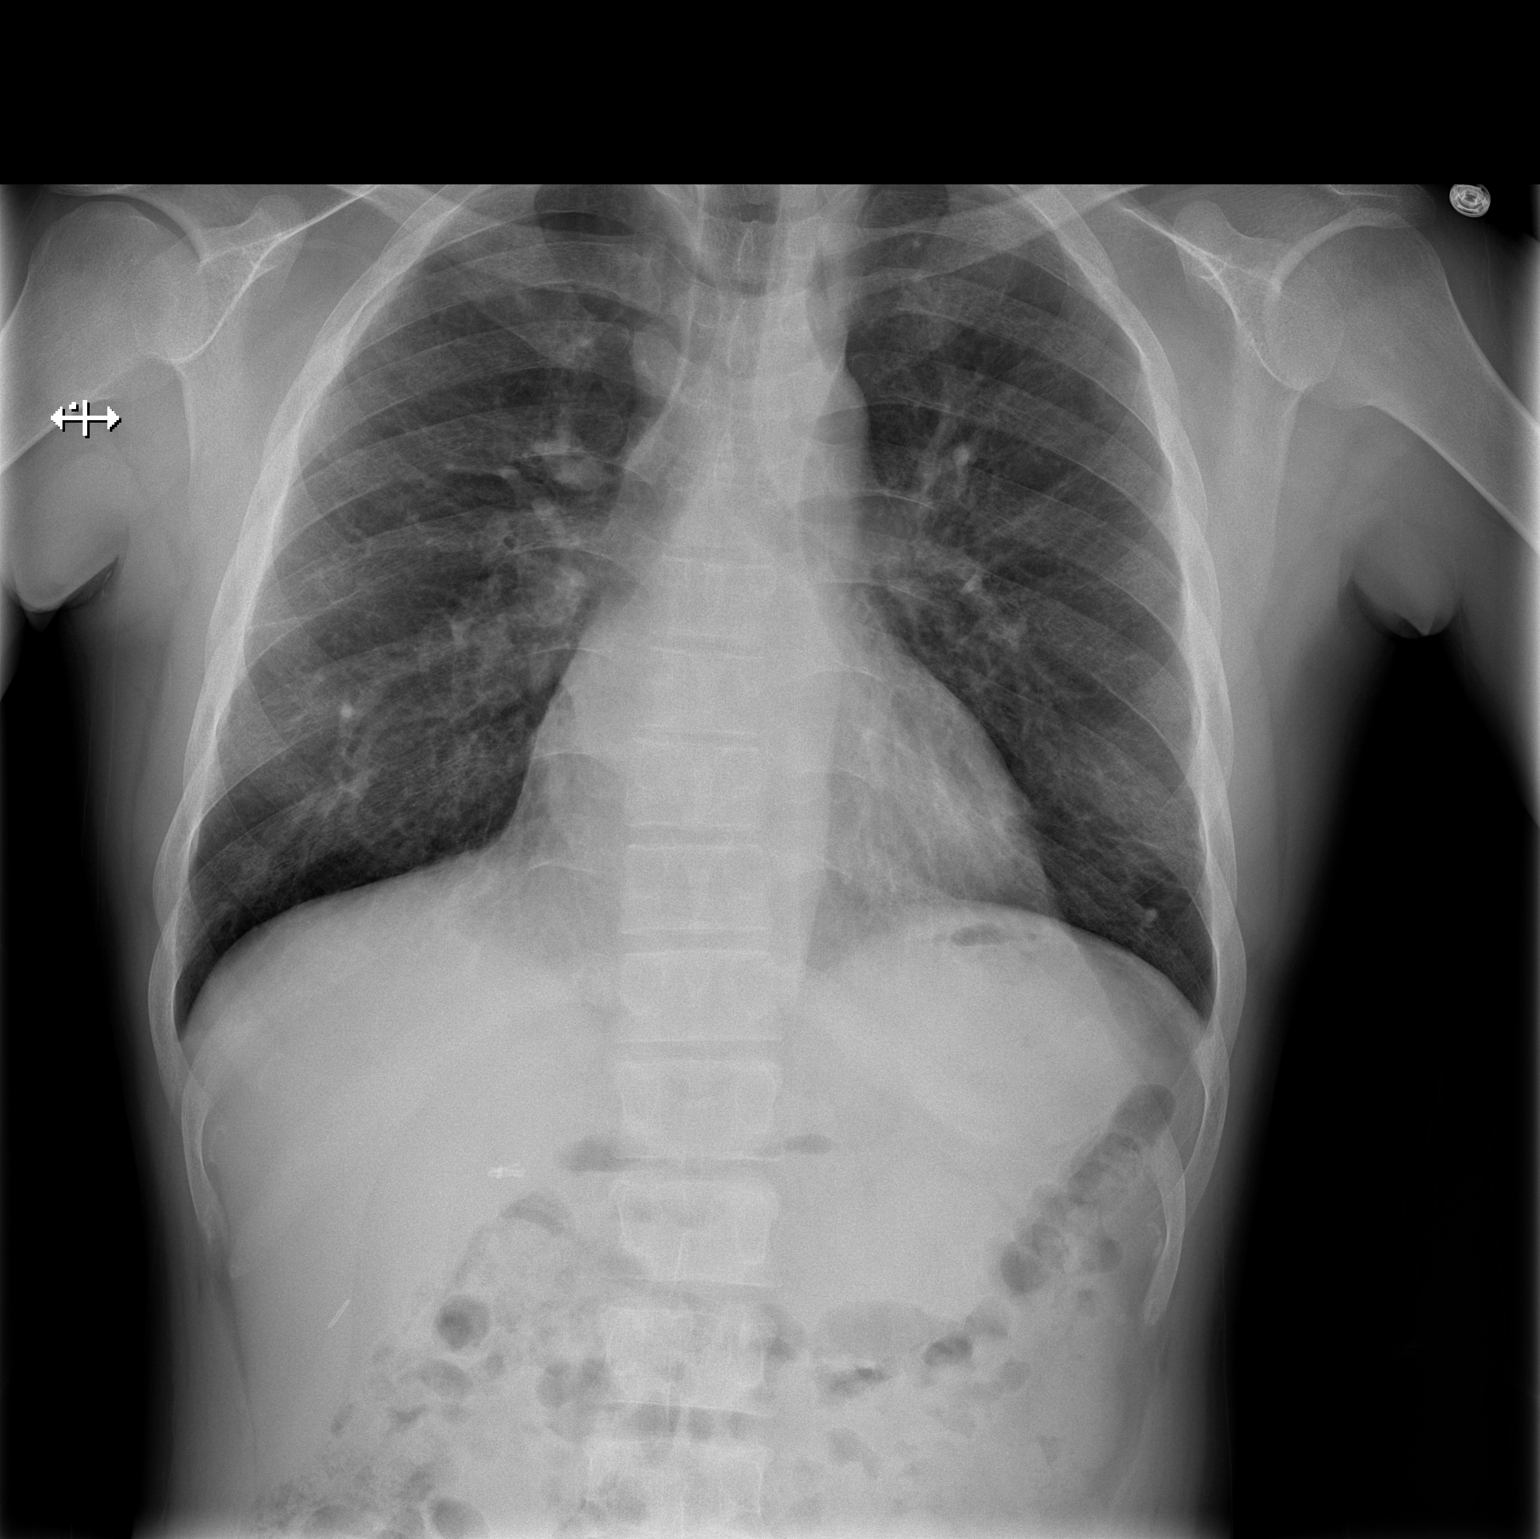

[w chest lat]
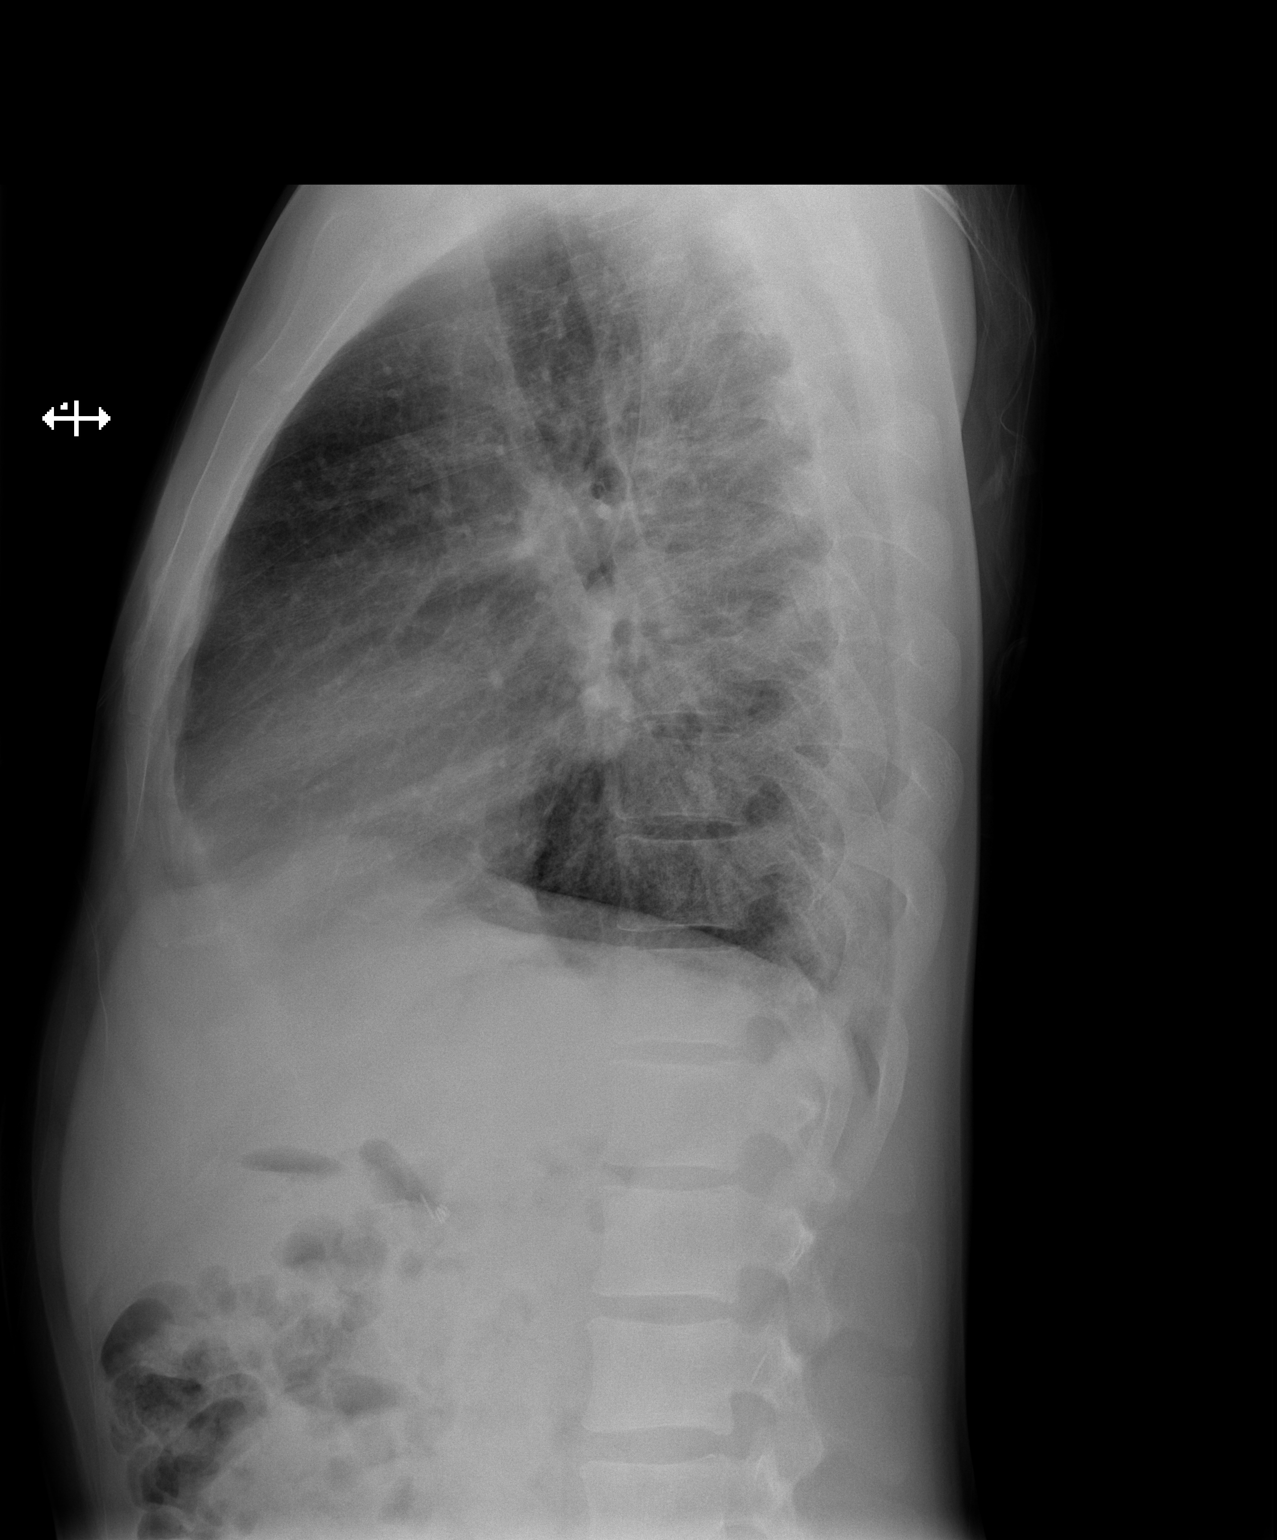

[2 of 2 positions shown; findings below may reference images not displayed]

FINDINGS: There is no consolidative process, pneumothorax or effusion. Small
foci of pulmonary scarring bilaterally are unchanged. Heart size is
normal. No focal bony abnormality is identified.
IMPRESSION: No acute disease.  Stable compared to prior exams.

## 2015-06-24 DIAGNOSIS — F17200 Nicotine dependence, unspecified, uncomplicated: Secondary | ICD-10-CM | POA: Diagnosis not present

## 2015-06-24 DIAGNOSIS — D57219 Sickle-cell/Hb-C disease with crisis, unspecified: Secondary | ICD-10-CM | POA: Diagnosis not present

## 2015-07-22 DIAGNOSIS — D57219 Sickle-cell/Hb-C disease with crisis, unspecified: Secondary | ICD-10-CM | POA: Diagnosis not present

## 2015-07-22 DIAGNOSIS — F17200 Nicotine dependence, unspecified, uncomplicated: Secondary | ICD-10-CM | POA: Diagnosis not present

## 2015-07-31 IMAGING — CR DG CHEST 1V PORT
1 series · 1 of 1 positions shown · non-contrast
Comparison: December 26, 2013.

CLINICAL DATA: Sickle cell crisis.

EXAM:
PORTABLE CHEST - 1 VIEW

[portable]
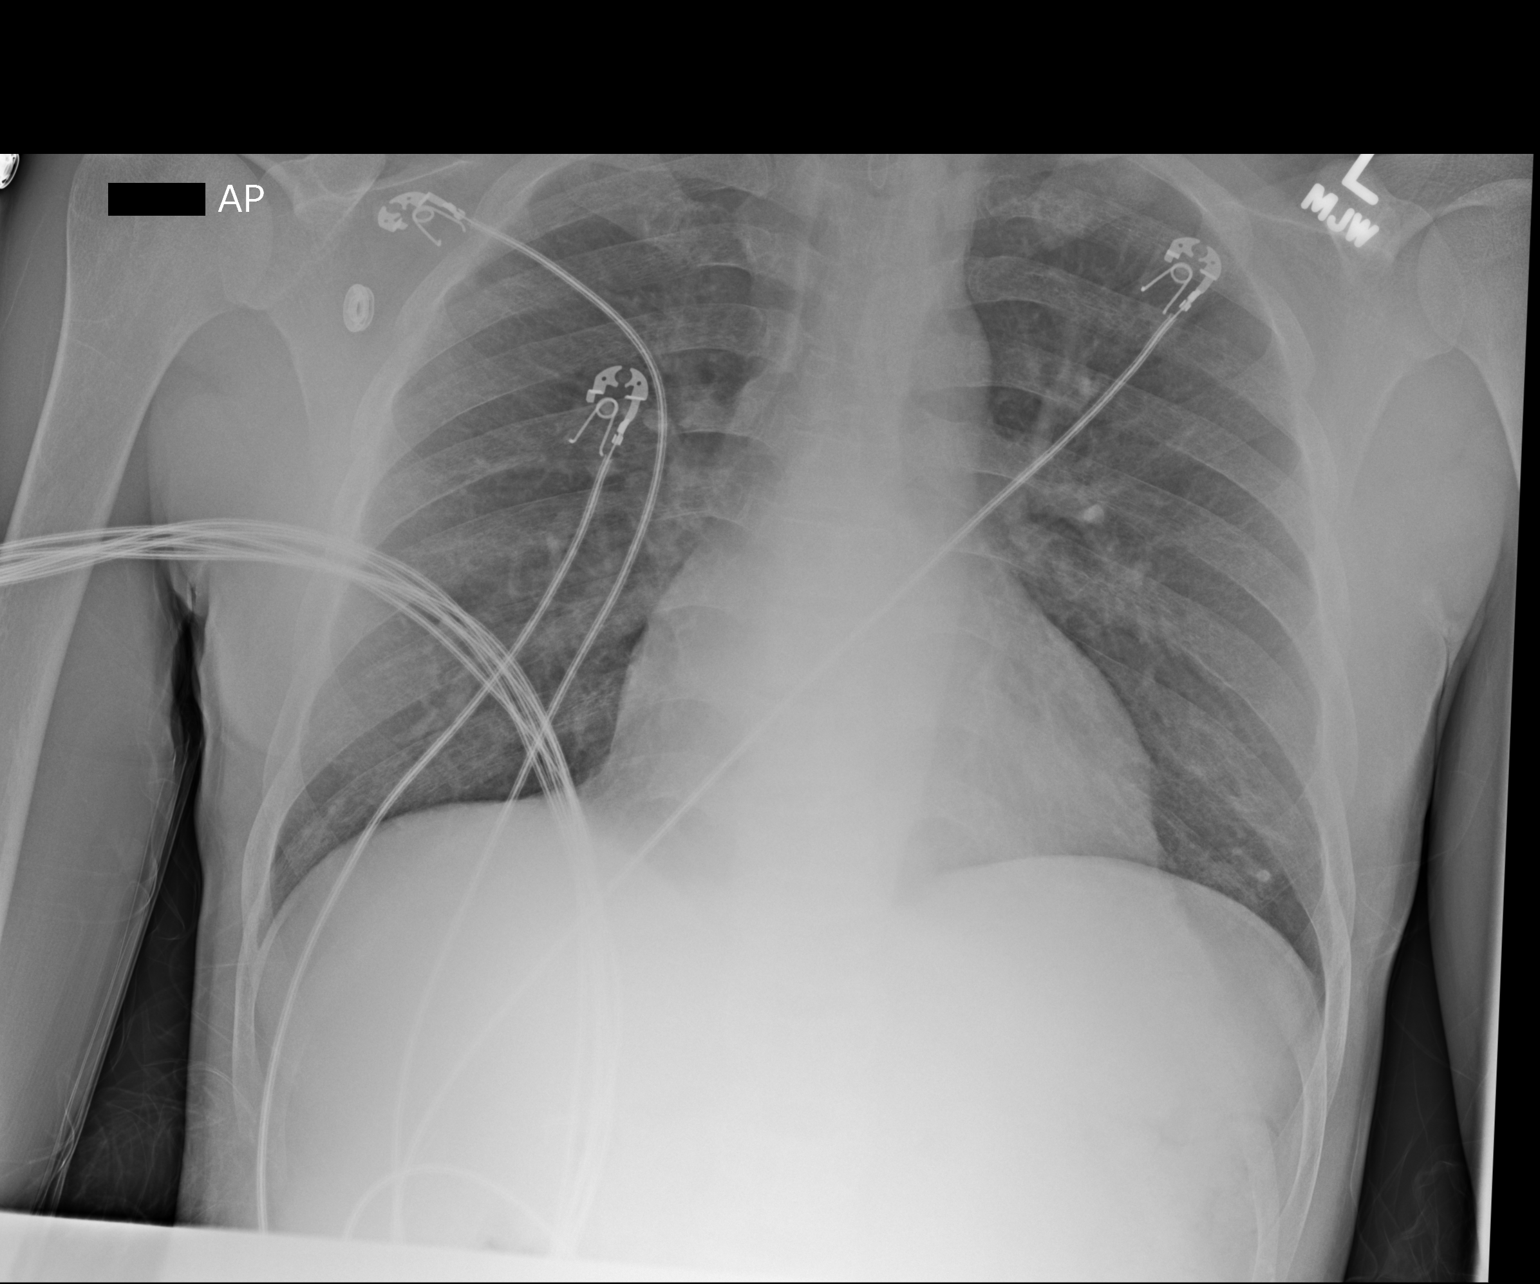

[1 of 1 positions shown; findings below may reference images not displayed]

FINDINGS: The heart size and mediastinal contours are within normal limits.
Both lungs are clear. No pneumothorax or pleural effusion is noted.
No significant osseous abnormality is noted.
IMPRESSION: No acute cardiopulmonary abnormality seen.

## 2015-08-18 DIAGNOSIS — F17200 Nicotine dependence, unspecified, uncomplicated: Secondary | ICD-10-CM | POA: Diagnosis not present

## 2015-08-18 DIAGNOSIS — D57219 Sickle-cell/Hb-C disease with crisis, unspecified: Secondary | ICD-10-CM | POA: Diagnosis not present

## 2015-08-24 IMAGING — DX DG CHEST 2V
2 series · 2 of 2 positions shown · non-contrast
Comparison: 02/23/2014

CLINICAL DATA: Shortness of breath, wheezing, bibasilar crackles

EXAM:
CHEST  2 VIEW

[chest pa]
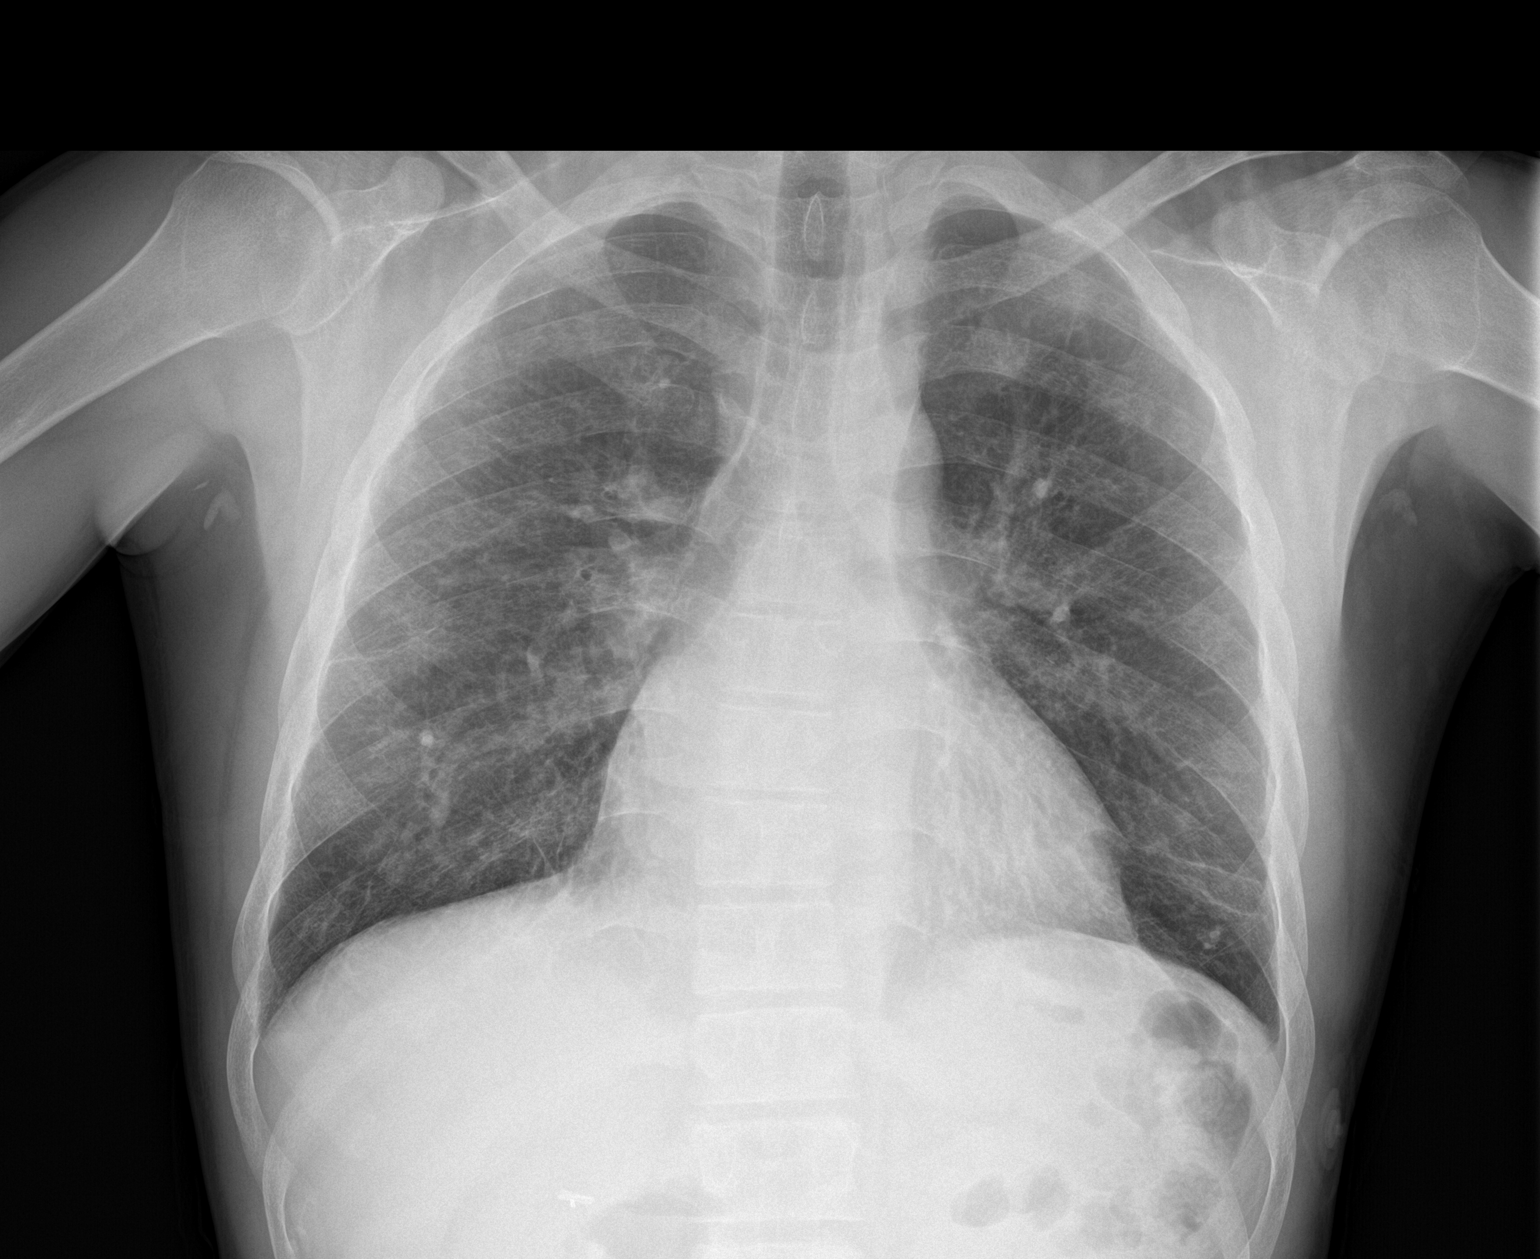

[chest lat]
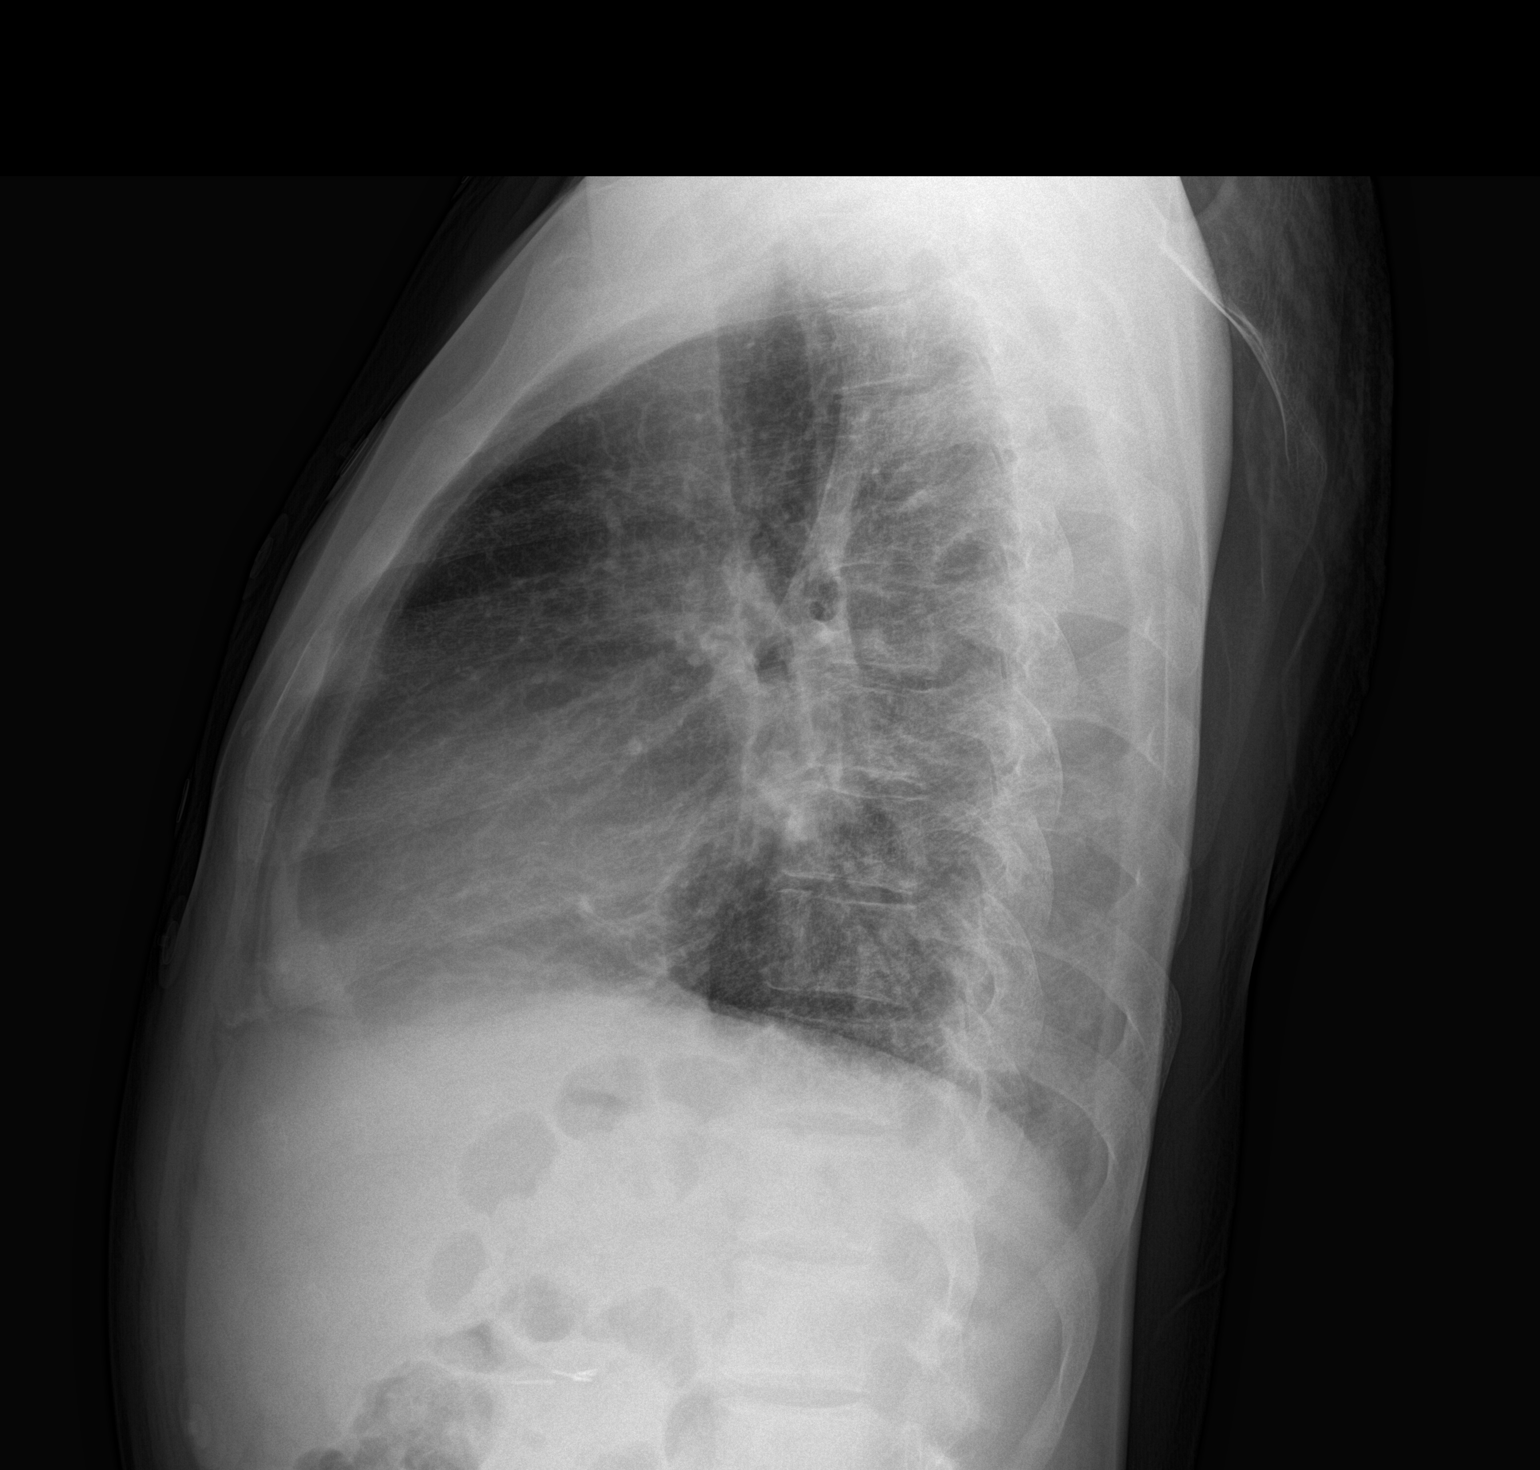

[2 of 2 positions shown; findings below may reference images not displayed]

FINDINGS: Coarse interstitial markings. Scarring in the right mid lung and
bilateral lower lobes. No focal consolidation. No pleural effusion
or pneumothorax.

Heart is normal size.

Visualized osseous structures are within normal limits.
IMPRESSION: No evidence of acute cardiopulmonary disease.

## 2015-08-27 IMAGING — CR DG CHEST 2V
2 series · 2 of 2 positions shown · non-contrast
Comparison: 03/19/2014

CLINICAL DATA: Chest pain and cough and shortness of breath.

EXAM:
CHEST  2 VIEW

[w chest lat]
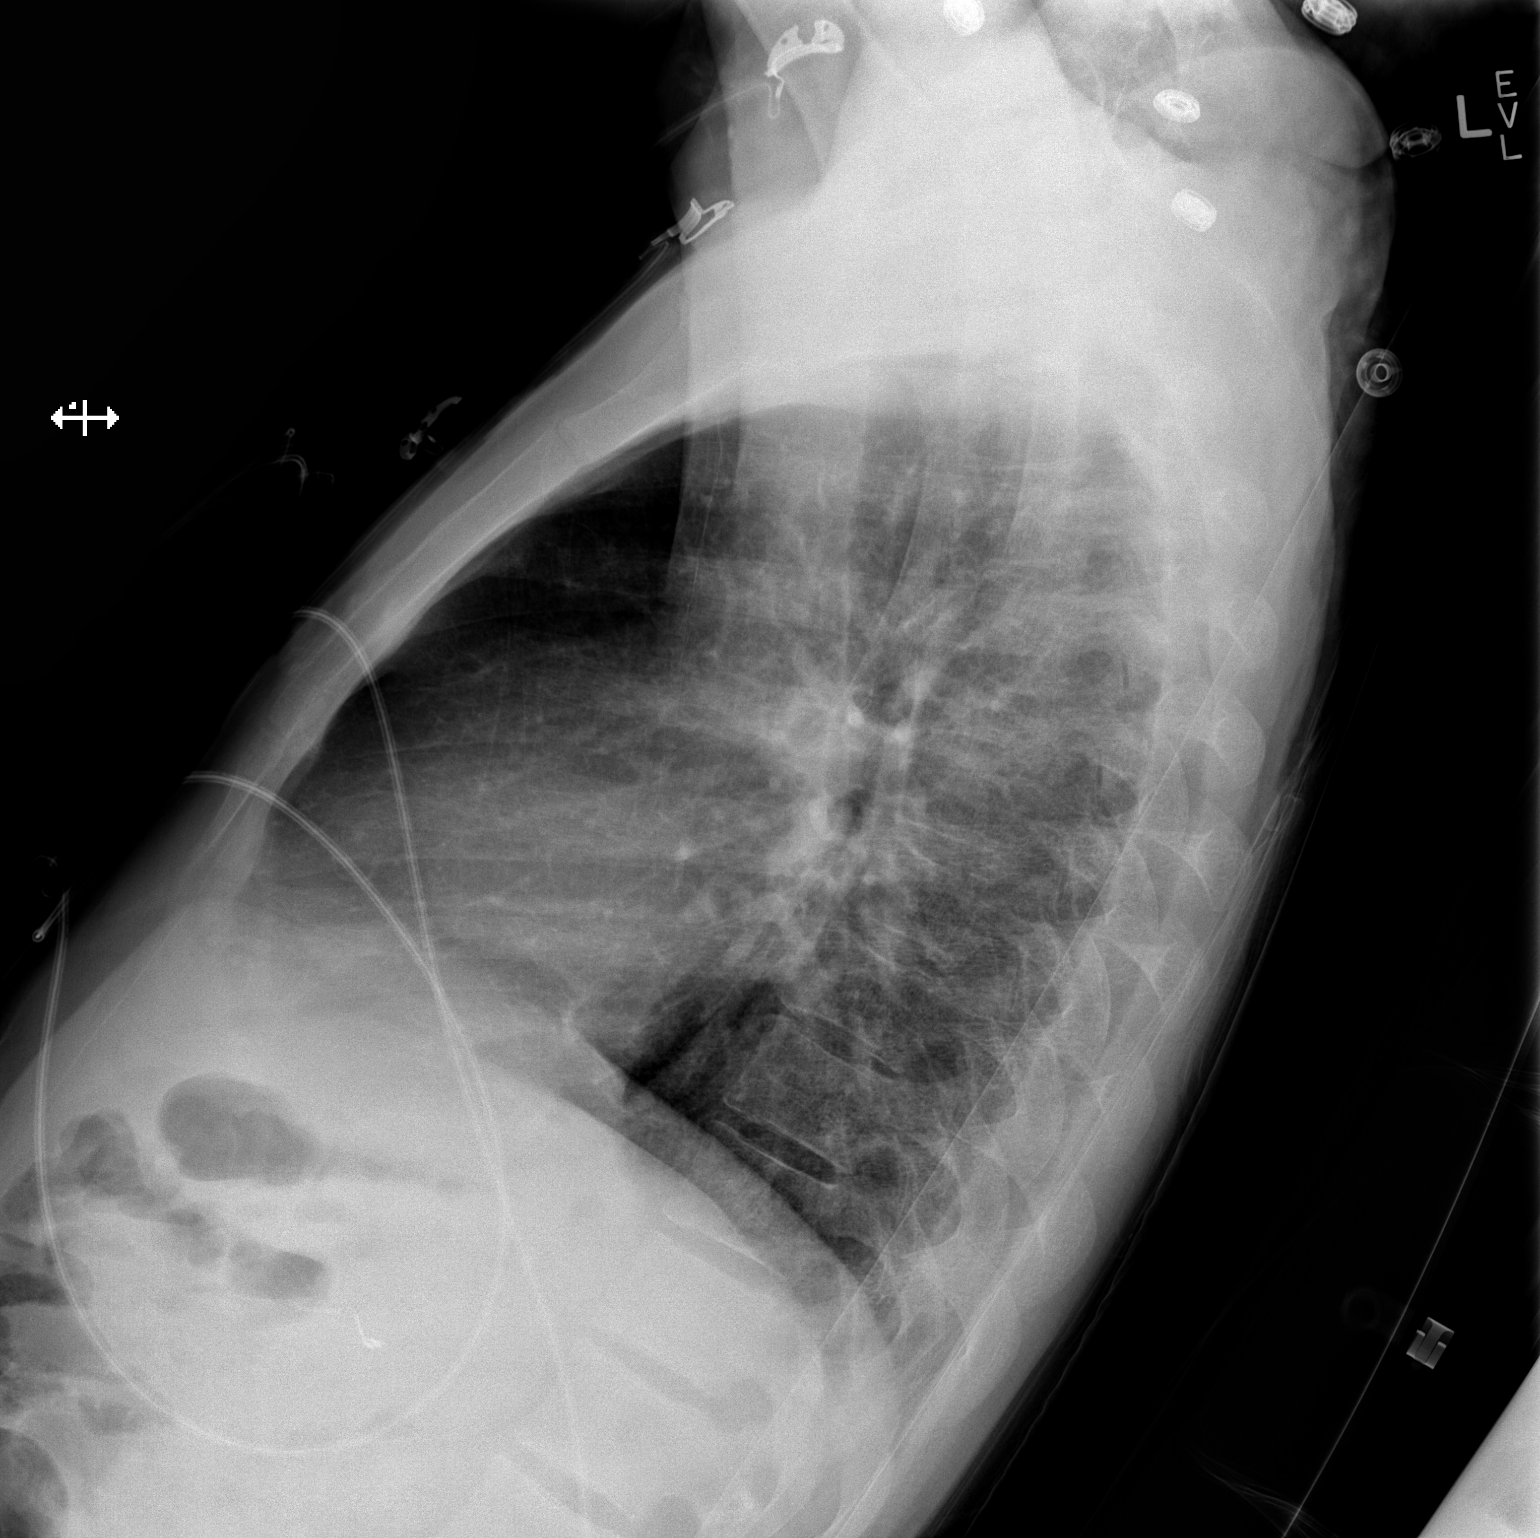

[x chest ap]
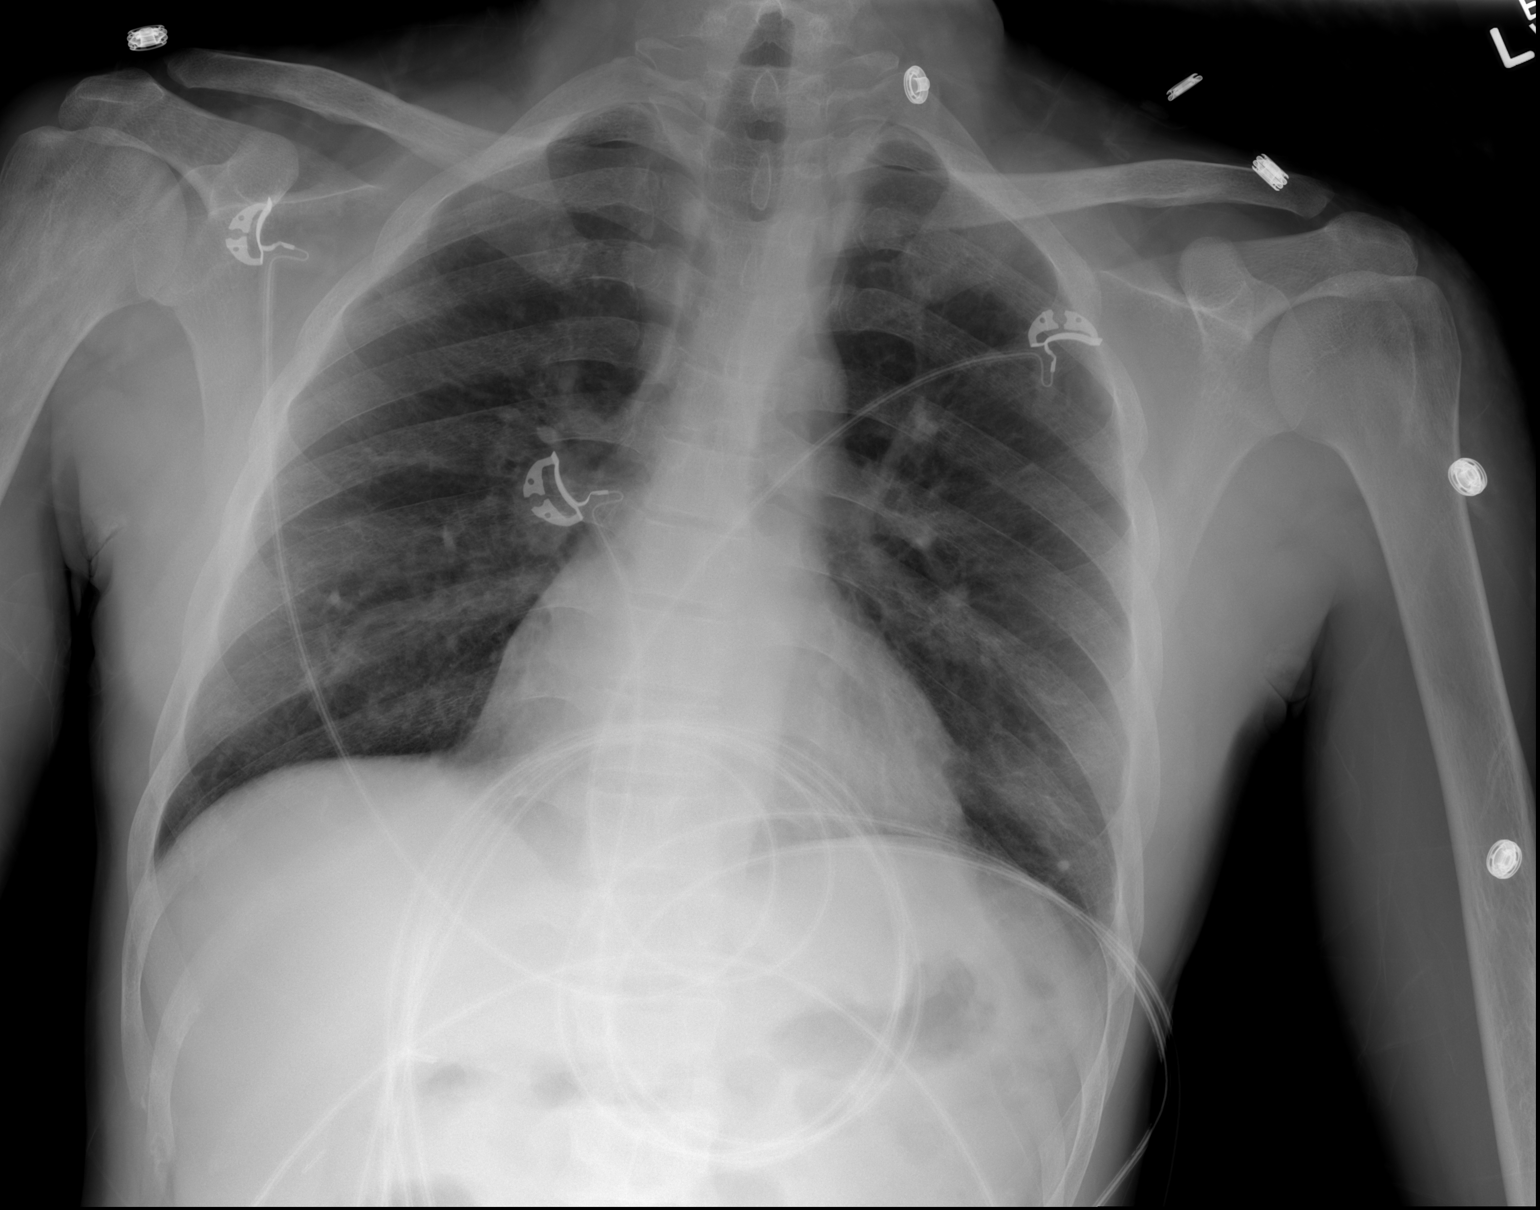

[2 of 2 positions shown; findings below may reference images not displayed]

FINDINGS: Heart size and pulmonary vascularity are normal. No acute
infiltrates or effusions. Small areas of scarring at both lung
bases, stable. No acute osseous abnormality.
IMPRESSION: No active cardiopulmonary disease.

## 2015-08-29 IMAGING — US US ABDOMEN COMPLETE
1 series · 13 of 25 positions shown · non-contrast
Comparison: Abdominal ultrasound February 21, 2007

CLINICAL DATA: Elevated liver function studies, history of sickle
cell anemia, chronic renal insufficiency, and previous
cholecystectomy.

EXAM:
ULTRASOUND ABDOMEN COMPLETE

[Series 1: us abdomen complete · 0.21mm/px · 13 of 59 slices shown]
[im 1/59]
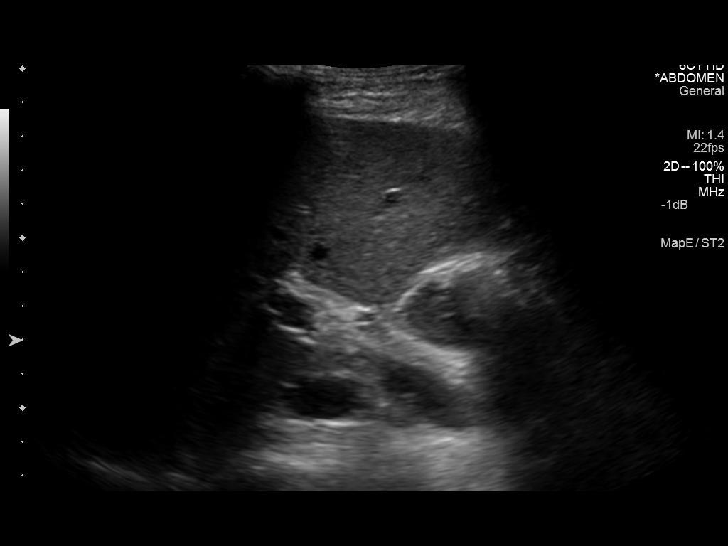
[im 5/59]
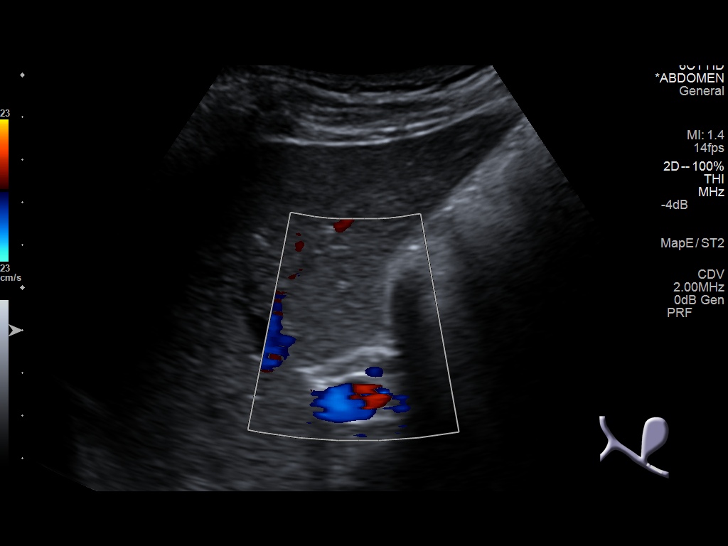
[im 10/59]
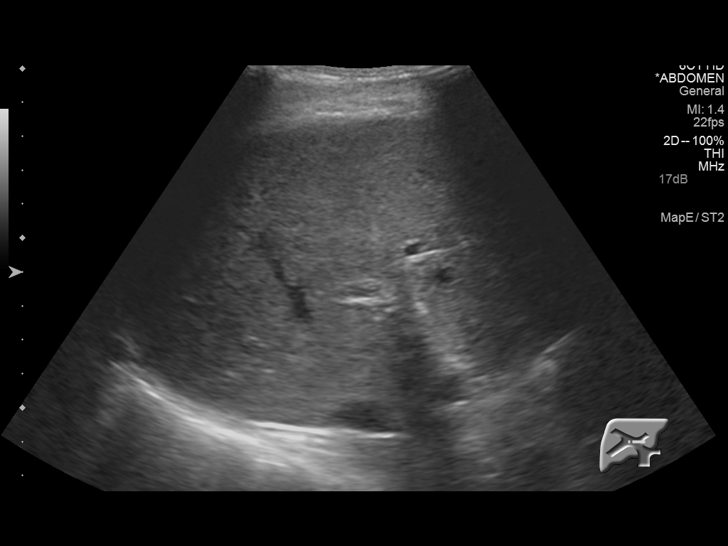
[im 15/59]
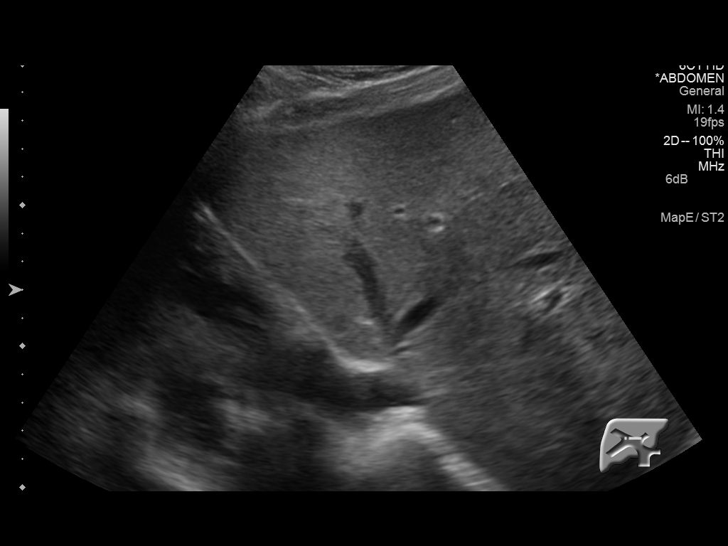
[im 20/59]
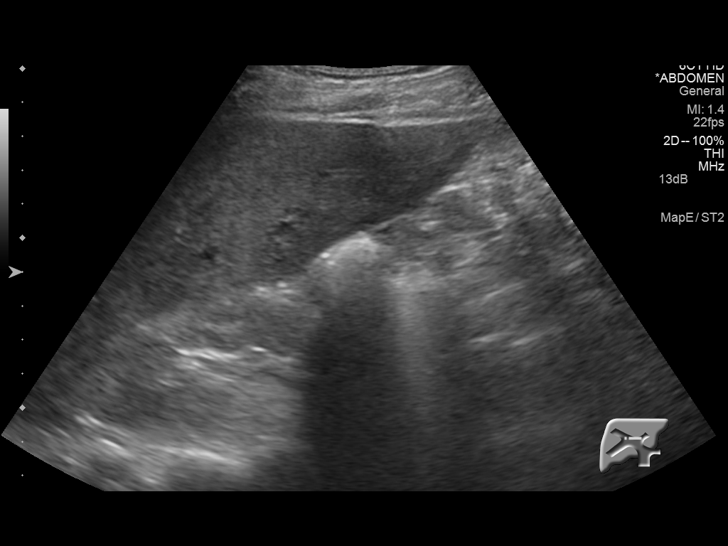
[im 25/59]
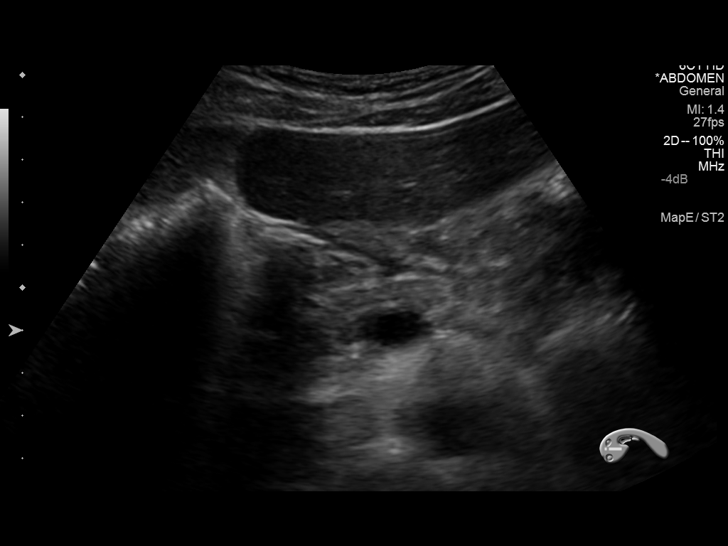
[im 30/59]
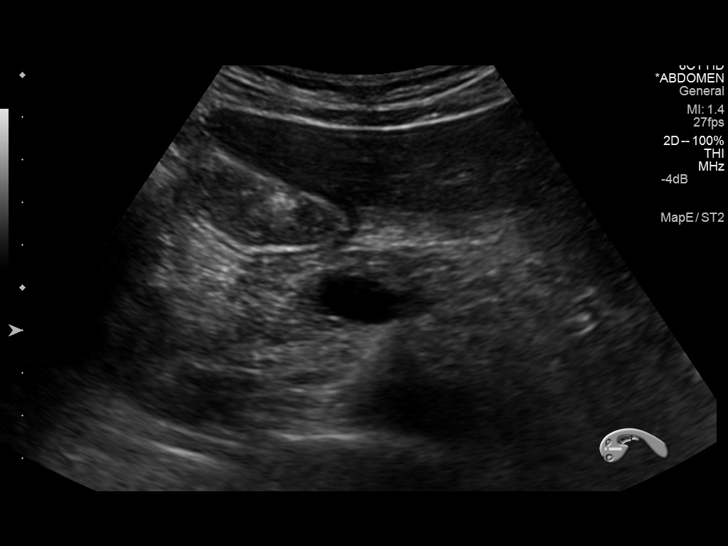
[im 34/59]
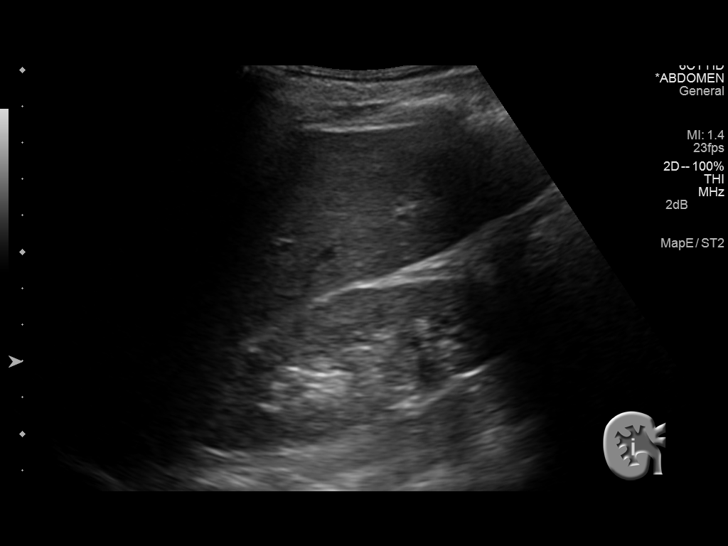
[im 39/59]
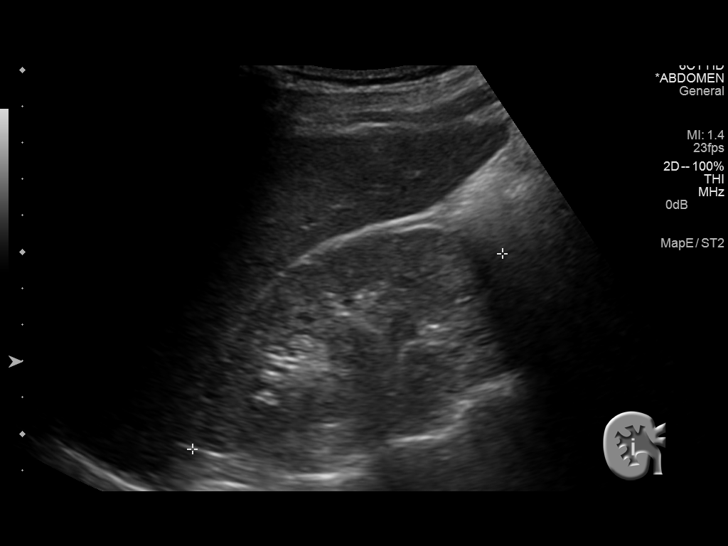
[im 44/59]
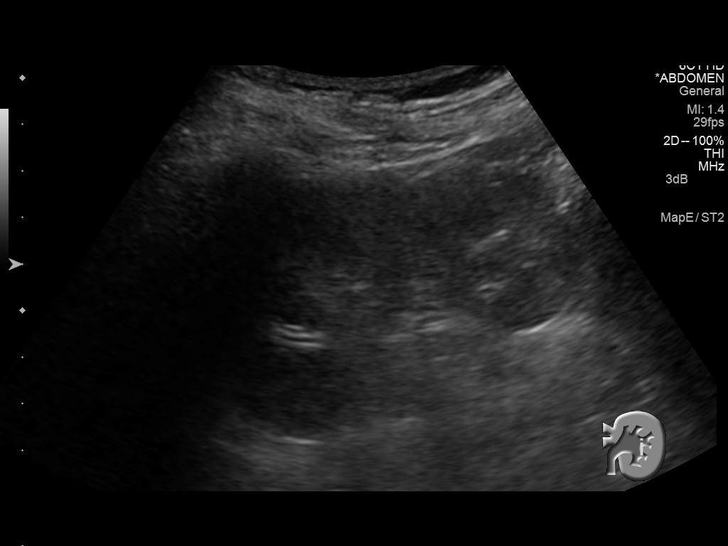
[im 49/59]
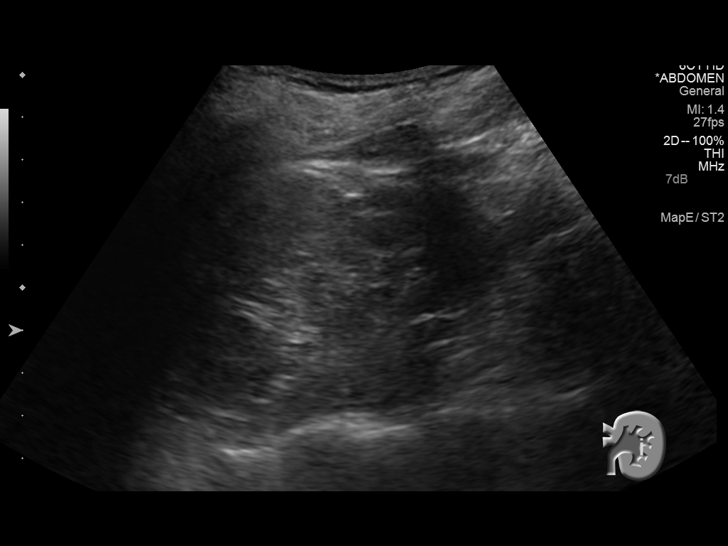
[im 54/59]
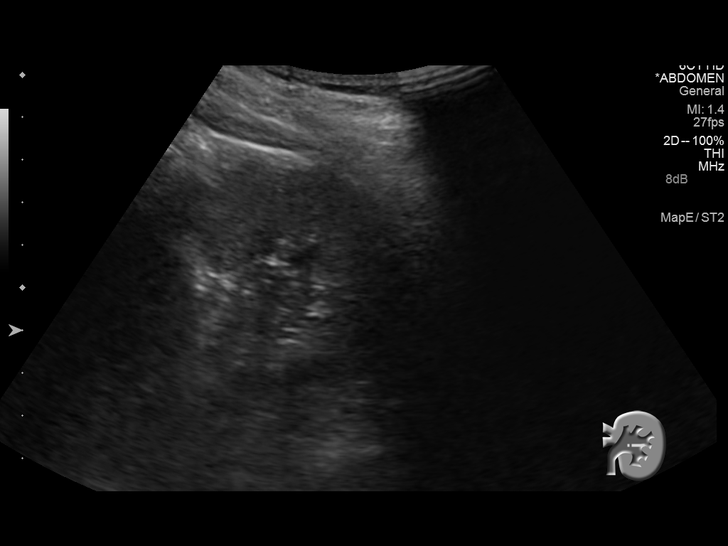
[im 59/59]
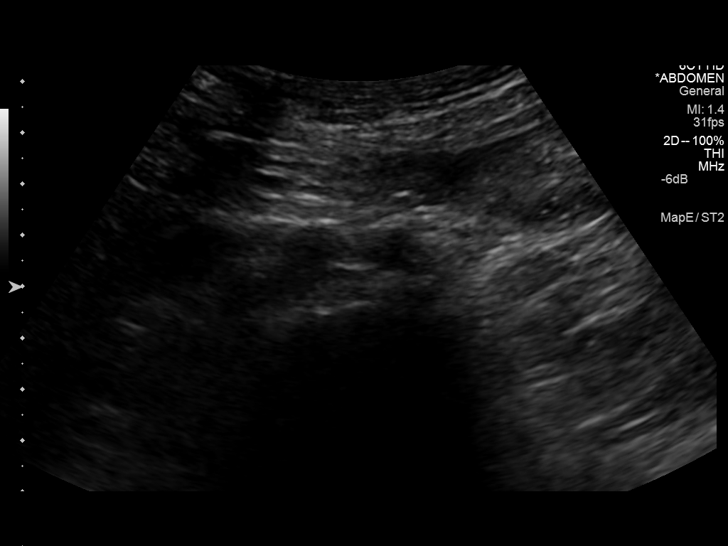

[13 of 25 positions shown; findings below may reference images not displayed]

FINDINGS: Gallbladder: The gallbladder is surgically absent.

Common bile duct: Diameter: 4.7 mm

Liver: No focal lesion identified. Within normal limits in
parenchymal echogenicity. There is no intrahepatic ductal dilation.

IVC: No abnormality visualized.

Pancreas: Visualized portion unremarkable.

Spleen: The spleen could not be demonstrated. This may be related to
the patient's sickle cell disease. The spleen was not visualized on
the 4662 study either.

Right Kidney: Length: 10.1 cm. Echogenicity is approximately equal
without of the adjacent liver. No mass or hydronephrosis visualized.

Left Kidney: Length: 9.3 cm. Echogenicity is mildly increased. No
mass or hydronephrosis visualized.

Abdominal aorta: The infrarenal aorta was obscured by bowel gas.

Other findings: There is no ascites.
IMPRESSION: 1. No acute abnormality of the liver, common bile duct, or pancreas
is demonstrated. The gallbladder is surgically absent.
2. The spleen could not be demonstrated at which is likely due to
auto splenectomy secondary to sickle cell disease.
3. Increased renal cortical echotexture is consistent with medical
renal disease. There is no hydronephrosis.

## 2015-09-17 DIAGNOSIS — D57219 Sickle-cell/Hb-C disease with crisis, unspecified: Secondary | ICD-10-CM | POA: Diagnosis not present

## 2015-09-17 DIAGNOSIS — F17200 Nicotine dependence, unspecified, uncomplicated: Secondary | ICD-10-CM | POA: Diagnosis not present

## 2015-09-20 ENCOUNTER — Encounter (HOSPITAL_COMMUNITY): Payer: Self-pay | Admitting: Emergency Medicine

## 2015-09-20 ENCOUNTER — Emergency Department (HOSPITAL_COMMUNITY)
Admission: EM | Admit: 2015-09-20 | Discharge: 2015-09-20 | Disposition: A | Discharge status: Home / Self Care | Payer: Medicare Other | Attending: Emergency Medicine | Admitting: Emergency Medicine

## 2015-09-20 DIAGNOSIS — D5700 Hb-SS disease with crisis, unspecified: Secondary | ICD-10-CM | POA: Insufficient documentation

## 2015-09-20 DIAGNOSIS — F17210 Nicotine dependence, cigarettes, uncomplicated: Secondary | ICD-10-CM | POA: Insufficient documentation

## 2015-09-20 DIAGNOSIS — N182 Chronic kidney disease, stage 2 (mild): Secondary | ICD-10-CM | POA: Insufficient documentation

## 2015-09-20 DIAGNOSIS — Z79891 Long term (current) use of opiate analgesic: Secondary | ICD-10-CM | POA: Diagnosis not present

## 2015-09-20 LAB — BASIC METABOLIC PANEL
ANION GAP: 9 (ref 5–15)
BUN: 19 mg/dL (ref 6–20)
CHLORIDE: 103 mmol/L (ref 101–111)
CO2: 25 mmol/L (ref 22–32)
CREATININE: 1.67 mg/dL — AB (ref 0.61–1.24)
Calcium: 8.5 mg/dL — ABNORMAL LOW (ref 8.9–10.3)
GFR calc non Af Amer: 53 mL/min — ABNORMAL LOW (ref >60)
Glucose, Bld: 101 mg/dL — ABNORMAL HIGH (ref 65–99)
POTASSIUM: 4 mmol/L (ref 3.5–5.1)
SODIUM: 137 mmol/L (ref 135–145)

## 2015-09-20 LAB — RETICULOCYTES
RBC.: 1.44 MIL/uL — AB (ref 4.22–5.81)
RETIC COUNT ABSOLUTE: 79.2 10*3/uL (ref 19.0–186.0)
RETIC CT PCT: 5.5 % — AB (ref 0.4–3.1)

## 2015-09-20 LAB — CBC WITH DIFFERENTIAL/PLATELET
BASOS ABS: 0 10*3/uL (ref 0.0–0.1)
Basophils Relative: 0 %
EOS ABS: 0.1 10*3/uL (ref 0.0–0.7)
Eosinophils Relative: 1 %
HEMATOCRIT: 19.7 % — AB (ref 39.0–52.0)
HEMOGLOBIN: 7.2 g/dL — AB (ref 13.0–17.0)
LYMPHS PCT: 49 %
Lymphs Abs: 3.7 10*3/uL (ref 0.7–4.0)
MCH: 50 pg — ABNORMAL HIGH (ref 26.0–34.0)
MCHC: 36.5 g/dL — ABNORMAL HIGH (ref 30.0–36.0)
MCV: 136.8 fL — ABNORMAL HIGH (ref 78.0–100.0)
MONOS PCT: 3 %
Monocytes Absolute: 0.2 10*3/uL (ref 0.1–1.0)
NEUTROS PCT: 47 %
Neutro Abs: 3.6 10*3/uL (ref 1.7–7.7)
Platelets: 182 10*3/uL (ref 150–400)
RBC: 1.44 MIL/uL — AB (ref 4.22–5.81)
RDW: 19.8 % — ABNORMAL HIGH (ref 11.5–15.5)
WBC: 7.6 10*3/uL (ref 4.0–10.5)

## 2015-09-20 MED ORDER — HYDROMORPHONE HCL 1 MG/ML IJ SOLN
1.5000 mg | Freq: Once | INTRAMUSCULAR | Status: AC
  Administered 2015-09-20: 1.5 mg via INTRAVENOUS
  Filled 2015-09-20: qty 2

## 2015-09-20 MED ORDER — KETOROLAC TROMETHAMINE 30 MG/ML IJ SOLN
30.0000 mg | Freq: Once | INTRAMUSCULAR | Status: AC
  Administered 2015-09-20: 30 mg via INTRAVENOUS
  Filled 2015-09-20: qty 1

## 2015-09-20 MED ORDER — HYDROMORPHONE HCL 1 MG/ML IJ SOLN
2.0000 mg | Freq: Once | INTRAMUSCULAR | Status: AC
  Administered 2015-09-20: 2 mg via INTRAVENOUS
  Filled 2015-09-20: qty 2

## 2015-09-20 MED ORDER — SODIUM CHLORIDE 0.9 % IV BOLUS (SEPSIS)
1000.0000 mL | Freq: Once | INTRAVENOUS | Status: AC
  Administered 2015-09-20: 1000 mL via INTRAVENOUS

## 2015-09-20 MED ORDER — HYDROMORPHONE HCL 1 MG/ML IJ SOLN
2.2500 mg | Freq: Once | INTRAMUSCULAR | Status: DC
  Filled 2015-09-20: qty 3

## 2015-09-20 MED ORDER — HYDROMORPHONE HCL 1 MG/ML IJ SOLN
1.0000 mg | Freq: Once | INTRAMUSCULAR | Status: AC
  Administered 2015-09-20: 1 mg via INTRAVENOUS
  Filled 2015-09-20: qty 1

## 2015-09-20 MED ORDER — SODIUM CHLORIDE 0.45 % IV SOLN
INTRAVENOUS | Status: DC
  Administered 2015-09-20: 15:00:00 via INTRAVENOUS

## 2015-09-20 MED ORDER — HYDROMORPHONE HCL 1 MG/ML IJ SOLN
1.0000 mg | Freq: Once | INTRAMUSCULAR | Status: AC
  Administered 2015-09-20: 1 mg via INTRAVENOUS

## 2015-09-20 NOTE — Discharge Instructions (Signed)
Continue home medications.  Follow-up with your primary care physician.

## 2015-09-20 NOTE — ED Triage Notes (Signed)
Pt. Stated, Im having a sickle cell crisis, legs started hurting last night.

## 2015-09-20 NOTE — ED Notes (Signed)
Patient placed on 2L nasal cannula.  Patient was rubbing his nose and this RN attempted to place the cannula back in the nose and asked the patient a couple of times to place the cannula back in his nose and he states "dammit women, I don't need you to tell me what to do".  I handed the patient a urinal and moved the bedside table closer.
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
## 2015-09-20 NOTE — ED Notes (Addendum)
Spoke with Dr. Jeneen Rinks regarding patient being lethargic and 2.25mg  dose of dilaudid.  Patient is able to communicate the correct year and month and vitals are stable.  Patient placed on 2L nasal cannula and see MAR for new orders.

## 2015-09-20 NOTE — ED Notes (Signed)
Patient Alert and oriented X4. Stable and ambulatory. Patient verbalized understanding of the discharge instructions.  Patient belongings were taken by the patient.  

## 2015-09-30 ENCOUNTER — Other Ambulatory Visit: Payer: Self-pay | Admitting: Family Medicine

## 2015-10-19 DIAGNOSIS — F17200 Nicotine dependence, unspecified, uncomplicated: Secondary | ICD-10-CM | POA: Diagnosis not present

## 2015-10-19 DIAGNOSIS — D57219 Sickle-cell/Hb-C disease with crisis, unspecified: Secondary | ICD-10-CM | POA: Diagnosis not present

## 2015-11-18 DIAGNOSIS — D57219 Sickle-cell/Hb-C disease with crisis, unspecified: Secondary | ICD-10-CM | POA: Diagnosis not present

## 2015-11-18 DIAGNOSIS — F17200 Nicotine dependence, unspecified, uncomplicated: Secondary | ICD-10-CM | POA: Diagnosis not present

## 2015-11-19 ENCOUNTER — Emergency Department (HOSPITAL_COMMUNITY): Payer: Medicare Other

## 2015-11-19 ENCOUNTER — Emergency Department (HOSPITAL_COMMUNITY)
Admission: EM | Admit: 2015-11-19 | Discharge: 2015-11-19 | Disposition: A | Discharge status: Home / Self Care | Payer: Medicare Other | Attending: Emergency Medicine | Admitting: Emergency Medicine

## 2015-11-19 ENCOUNTER — Encounter (HOSPITAL_COMMUNITY): Payer: Self-pay | Admitting: Emergency Medicine

## 2015-11-19 DIAGNOSIS — S92351A Displaced fracture of fifth metatarsal bone, right foot, initial encounter for closed fracture: Secondary | ICD-10-CM | POA: Diagnosis not present

## 2015-11-19 DIAGNOSIS — W52XXXA Crushed, pushed or stepped on by crowd or human stampede, initial encounter: Secondary | ICD-10-CM | POA: Diagnosis not present

## 2015-11-19 DIAGNOSIS — Z791 Long term (current) use of non-steroidal anti-inflammatories (NSAID): Secondary | ICD-10-CM | POA: Insufficient documentation

## 2015-11-19 DIAGNOSIS — F1290 Cannabis use, unspecified, uncomplicated: Secondary | ICD-10-CM | POA: Diagnosis not present

## 2015-11-19 DIAGNOSIS — Y9389 Activity, other specified: Secondary | ICD-10-CM | POA: Insufficient documentation

## 2015-11-19 DIAGNOSIS — F17210 Nicotine dependence, cigarettes, uncomplicated: Secondary | ICD-10-CM | POA: Insufficient documentation

## 2015-11-19 DIAGNOSIS — S99921A Unspecified injury of right foot, initial encounter: Secondary | ICD-10-CM | POA: Diagnosis present

## 2015-11-19 DIAGNOSIS — Y999 Unspecified external cause status: Secondary | ICD-10-CM | POA: Insufficient documentation

## 2015-11-19 DIAGNOSIS — Z79899 Other long term (current) drug therapy: Secondary | ICD-10-CM | POA: Insufficient documentation

## 2015-11-19 DIAGNOSIS — N182 Chronic kidney disease, stage 2 (mild): Secondary | ICD-10-CM | POA: Insufficient documentation

## 2015-11-19 DIAGNOSIS — Y9289 Other specified places as the place of occurrence of the external cause: Secondary | ICD-10-CM | POA: Diagnosis not present

## 2015-11-19 DIAGNOSIS — S92311A Displaced fracture of first metatarsal bone, right foot, initial encounter for closed fracture: Secondary | ICD-10-CM | POA: Diagnosis not present

## 2015-11-19 DIAGNOSIS — S92901A Unspecified fracture of right foot, initial encounter for closed fracture: Secondary | ICD-10-CM

## 2015-11-19 DIAGNOSIS — M7989 Other specified soft tissue disorders: Secondary | ICD-10-CM | POA: Diagnosis not present

## 2015-11-19 MED ORDER — OXYCODONE-ACETAMINOPHEN 5-325 MG PO TABS: 2.0000 | ORAL_TABLET | ORAL | 0 refills | Status: DC | PRN

## 2015-11-19 MED ORDER — OXYCODONE-ACETAMINOPHEN 5-325 MG PO TABS
1.0000 | ORAL_TABLET | Freq: Once | ORAL | Status: AC
  Administered 2015-11-19: 1 via ORAL
  Filled 2015-11-19: qty 1
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
## 2015-11-19 NOTE — Discharge Instructions (Addendum)
Partial weightbearing. Use crutches. Wear cast shoe until follow-up with orthopedics.  Ice and elevate the foot whenever possible.  Call Dr. Doran Durand for an outpatient appointment within one to 2 weeks
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
## 2015-12-03 ENCOUNTER — Emergency Department (HOSPITAL_COMMUNITY): Payer: No Typology Code available for payment source

## 2015-12-03 ENCOUNTER — Encounter (HOSPITAL_COMMUNITY): Payer: Self-pay | Admitting: Nurse Practitioner

## 2015-12-03 ENCOUNTER — Emergency Department (HOSPITAL_COMMUNITY)
Admission: EM | Admit: 2015-12-03 | Discharge: 2015-12-03 | Disposition: A | Discharge status: Home / Self Care | Payer: No Typology Code available for payment source | Attending: Emergency Medicine | Admitting: Emergency Medicine

## 2015-12-03 DIAGNOSIS — M79671 Pain in right foot: Secondary | ICD-10-CM | POA: Diagnosis not present

## 2015-12-03 DIAGNOSIS — M7989 Other specified soft tissue disorders: Secondary | ICD-10-CM | POA: Diagnosis not present

## 2015-12-03 DIAGNOSIS — F17210 Nicotine dependence, cigarettes, uncomplicated: Secondary | ICD-10-CM | POA: Diagnosis not present

## 2015-12-03 DIAGNOSIS — S92315A Nondisplaced fracture of first metatarsal bone, left foot, initial encounter for closed fracture: Secondary | ICD-10-CM | POA: Diagnosis not present

## 2015-12-03 DIAGNOSIS — N182 Chronic kidney disease, stage 2 (mild): Secondary | ICD-10-CM | POA: Diagnosis not present

## 2015-12-03 DIAGNOSIS — Y939 Activity, unspecified: Secondary | ICD-10-CM | POA: Insufficient documentation

## 2015-12-03 DIAGNOSIS — S52125A Nondisplaced fracture of head of left radius, initial encounter for closed fracture: Secondary | ICD-10-CM | POA: Diagnosis not present

## 2015-12-03 DIAGNOSIS — S92312A Displaced fracture of first metatarsal bone, left foot, initial encounter for closed fracture: Secondary | ICD-10-CM

## 2015-12-03 DIAGNOSIS — Y92410 Unspecified street and highway as the place of occurrence of the external cause: Secondary | ICD-10-CM | POA: Insufficient documentation

## 2015-12-03 DIAGNOSIS — S92311A Displaced fracture of first metatarsal bone, right foot, initial encounter for closed fracture: Secondary | ICD-10-CM | POA: Diagnosis not present

## 2015-12-03 DIAGNOSIS — M79672 Pain in left foot: Secondary | ICD-10-CM | POA: Diagnosis not present

## 2015-12-03 DIAGNOSIS — M25522 Pain in left elbow: Secondary | ICD-10-CM | POA: Insufficient documentation

## 2015-12-03 DIAGNOSIS — S99921A Unspecified injury of right foot, initial encounter: Secondary | ICD-10-CM | POA: Diagnosis not present

## 2015-12-03 DIAGNOSIS — M25422 Effusion, left elbow: Secondary | ICD-10-CM | POA: Diagnosis not present

## 2015-12-03 DIAGNOSIS — Y999 Unspecified external cause status: Secondary | ICD-10-CM | POA: Diagnosis not present

## 2015-12-03 DIAGNOSIS — S99922A Unspecified injury of left foot, initial encounter: Secondary | ICD-10-CM | POA: Diagnosis not present

## 2015-12-03 MED ORDER — OXYCODONE-ACETAMINOPHEN 5-325 MG PO TABS: 1.0000 | ORAL_TABLET | Freq: Four times a day (QID) | ORAL | 0 refills | Status: DC | PRN

## 2015-12-03 MED ORDER — HYDROMORPHONE HCL 2 MG/ML IJ SOLN
1.0000 mg | Freq: Once | INTRAMUSCULAR | Status: AC
  Administered 2015-12-03: 1 mg via INTRAMUSCULAR
  Filled 2015-12-03: qty 1

## 2015-12-03 MED ORDER — OXYCODONE-ACETAMINOPHEN 5-325 MG PO TABS
1.0000 | ORAL_TABLET | Freq: Once | ORAL | Status: DC
  Filled 2015-12-03: qty 1

## 2015-12-03 NOTE — Progress Notes (Signed)
Orthopedic Tech Progress Note Patient Details:  [name] [name]. [birth year] [id number]  Ortho Devices Type of Ortho Device: Ace wrap, Sugartong splint, Long arm splint Ortho Device/Splint Location: lue Ortho Device/Splint Interventions: Application   [name] [name] 12/03/2015, 2:44 PM

## 2015-12-03 NOTE — Discharge Instructions (Signed)
Follow-up with the orthopedist provided.  Return here as needed.  Ice and elevate the areas that are sore
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
## 2015-12-03 NOTE — ED Notes (Addendum)
Returned from [employer]. Requesting pain meds. PA aware.

## 2015-12-03 NOTE — ED Notes (Signed)
Ortho tech notified.  
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
## 2015-12-03 NOTE — ED Notes (Signed)
Ortho tech in.

## 2015-12-03 NOTE — ED Notes (Signed)
Patient transported to X-ray 

## 2015-12-03 NOTE — ED Notes (Signed)
Pt refused Percocet. States "I want a shot, like Dilaudid. I take Percocet all the time".

## 2015-12-14 ENCOUNTER — Other Ambulatory Visit: Payer: Self-pay | Admitting: Family Medicine

## 2015-12-16 DIAGNOSIS — F17200 Nicotine dependence, unspecified, uncomplicated: Secondary | ICD-10-CM | POA: Diagnosis not present

## 2015-12-16 DIAGNOSIS — D57219 Sickle-cell/Hb-C disease with crisis, unspecified: Secondary | ICD-10-CM | POA: Diagnosis not present

## 2015-12-28 ENCOUNTER — Other Ambulatory Visit: Payer: Self-pay | Admitting: Family Medicine

## 2016-01-17 DIAGNOSIS — D57219 Sickle-cell/Hb-C disease with crisis, unspecified: Secondary | ICD-10-CM | POA: Diagnosis not present

## 2016-01-17 DIAGNOSIS — F17200 Nicotine dependence, unspecified, uncomplicated: Secondary | ICD-10-CM | POA: Diagnosis not present

## 2016-01-22 IMAGING — CR DG CHEST 2V
2 series · 2 of 2 positions shown · non-contrast
Comparison: [DATE] [DATE], [DATE], [DATE] [DATE], [DATE]

CLINICAL DATA: Left lower chest pain and shortness of breath for 2
days.

EXAM:
CHEST  2 VIEW

[chest pa]
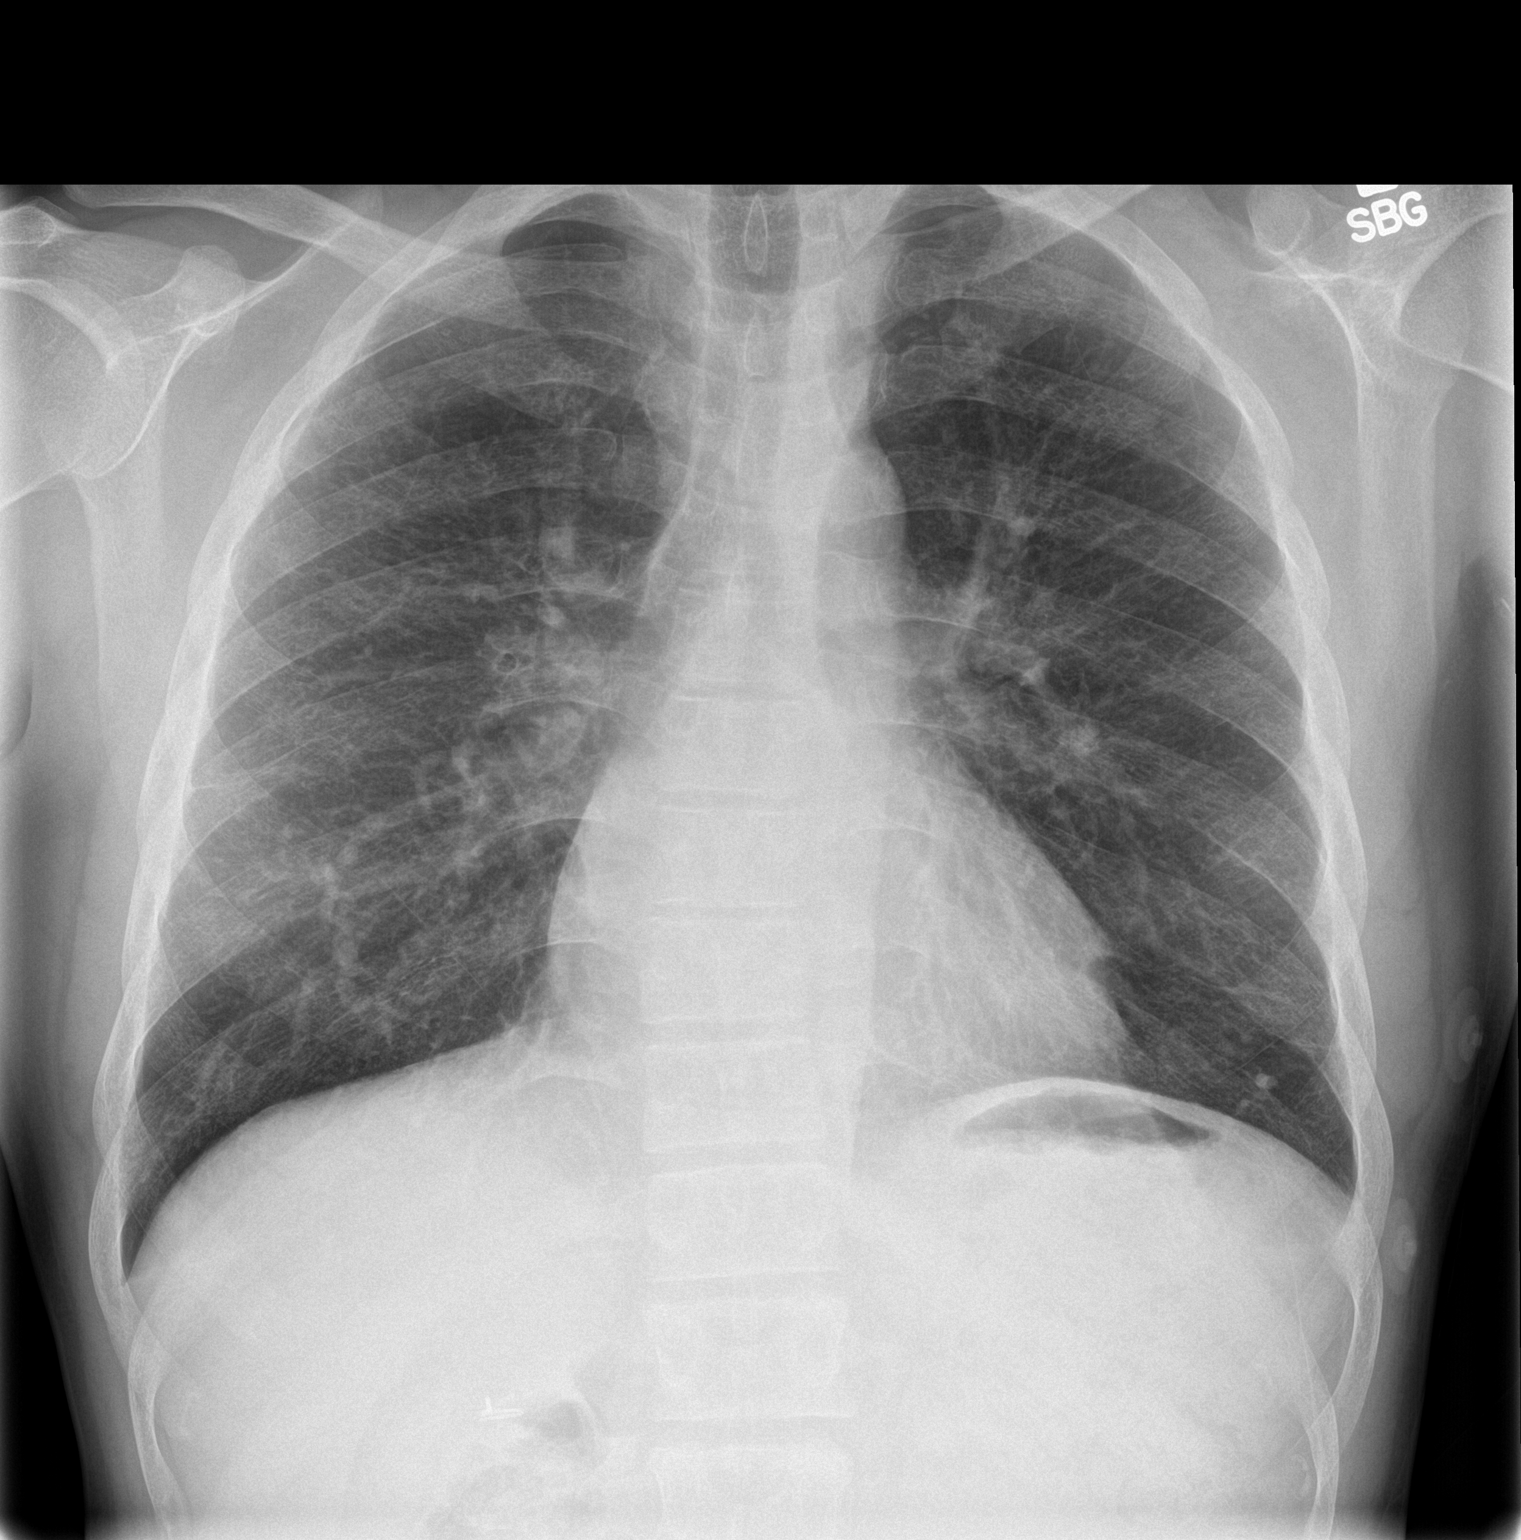

[chest lat]
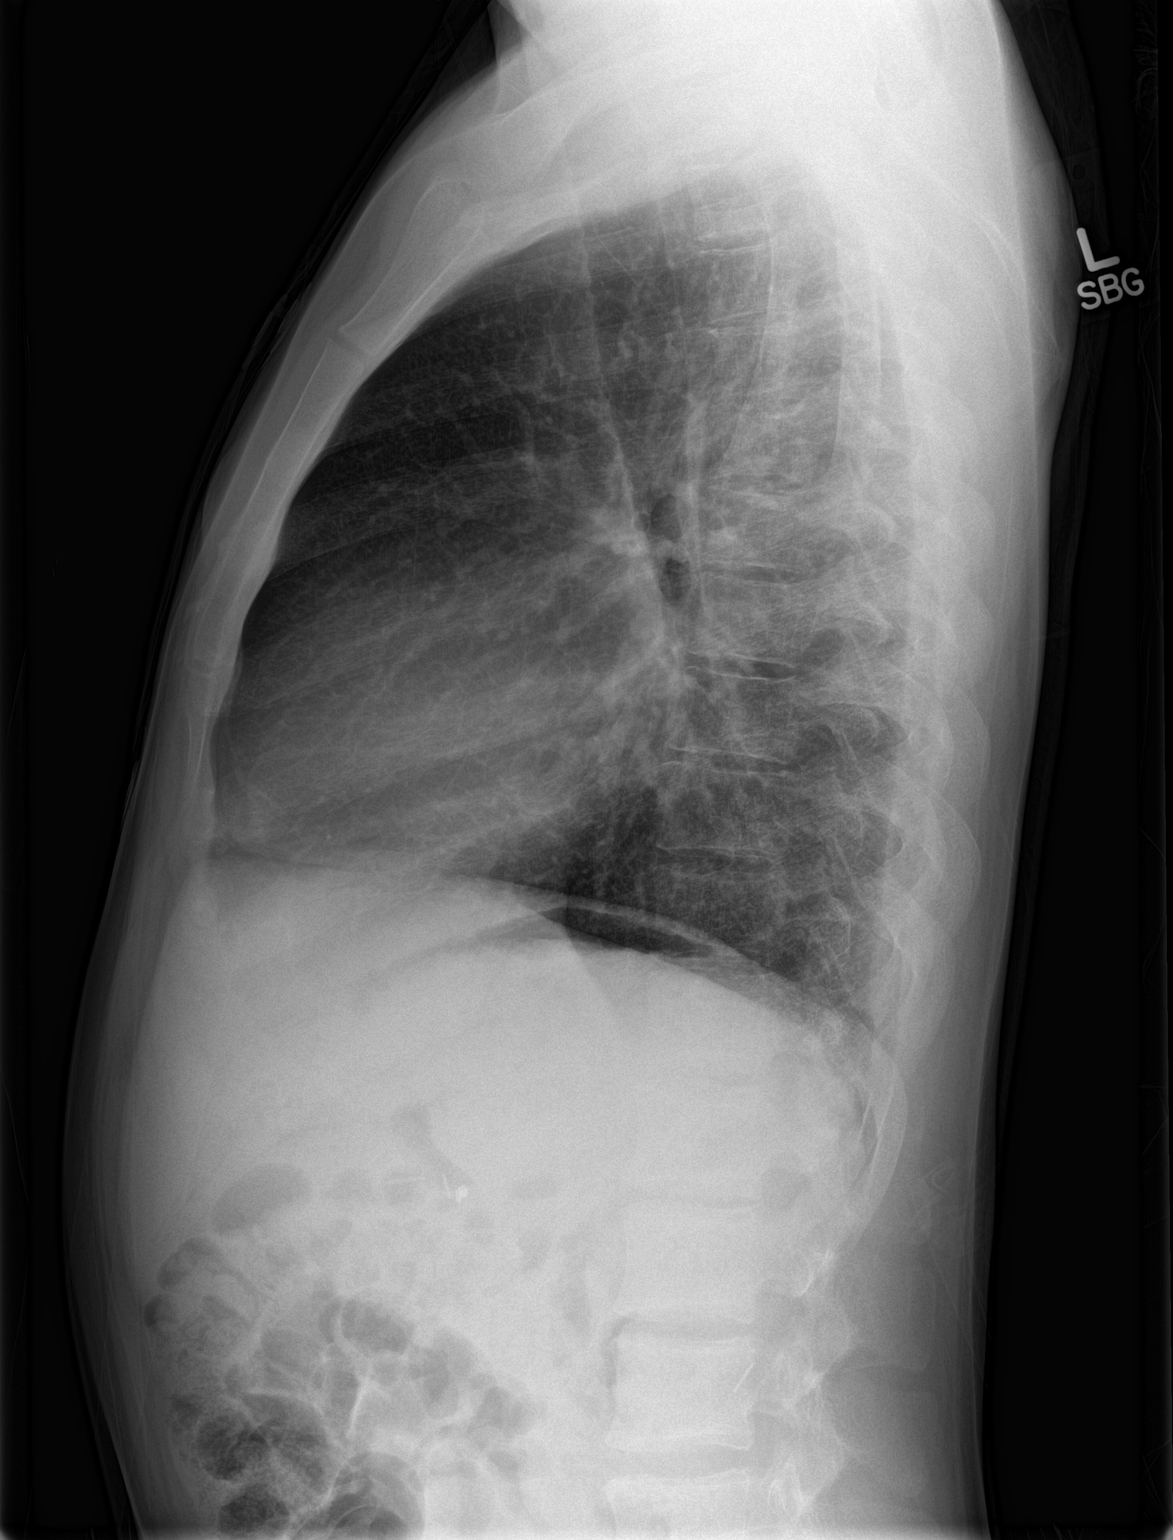

[2 of 2 positions shown; findings below may reference images not displayed]

FINDINGS: The heart size and mediastinal contours are within normal limits.
There is mild increased pulmonary interstitium unchanged since prior
exams, chronic. Increased vascular markings are identified in the
right lower lobe unchanged compared to prior exam. There is no focal
pneumonia or pleural effusion. The visualized skeletal structures
are unremarkable.
IMPRESSION: Mild increased pulmonary interstitium unchanged compared to prior
exams, chronic. No focal acute abnormality identified in the lungs.

## 2016-02-18 DIAGNOSIS — F17200 Nicotine dependence, unspecified, uncomplicated: Secondary | ICD-10-CM | POA: Diagnosis not present

## 2016-02-18 DIAGNOSIS — R52 Pain, unspecified: Secondary | ICD-10-CM | POA: Diagnosis not present

## 2016-02-18 DIAGNOSIS — D57219 Sickle-cell/Hb-C disease with crisis, unspecified: Secondary | ICD-10-CM | POA: Diagnosis not present

## 2016-02-26 ENCOUNTER — Observation Stay (HOSPITAL_COMMUNITY)
Admission: EM | Admit: 2016-02-26 | Discharge: 2016-02-27 | Disposition: A | Discharge status: Home / Self Care | Payer: Medicare Other | Attending: Internal Medicine | Admitting: Internal Medicine

## 2016-02-26 ENCOUNTER — Encounter (HOSPITAL_COMMUNITY): Payer: Self-pay

## 2016-02-26 DIAGNOSIS — N179 Acute kidney failure, unspecified: Principal | ICD-10-CM | POA: Diagnosis present

## 2016-02-26 DIAGNOSIS — Z791 Long term (current) use of non-steroidal anti-inflammatories (NSAID): Secondary | ICD-10-CM | POA: Insufficient documentation

## 2016-02-26 DIAGNOSIS — E872 Acidosis: Secondary | ICD-10-CM | POA: Insufficient documentation

## 2016-02-26 DIAGNOSIS — Z9049 Acquired absence of other specified parts of digestive tract: Secondary | ICD-10-CM | POA: Diagnosis not present

## 2016-02-26 DIAGNOSIS — F39 Unspecified mood [affective] disorder: Secondary | ICD-10-CM | POA: Insufficient documentation

## 2016-02-26 DIAGNOSIS — F17210 Nicotine dependence, cigarettes, uncomplicated: Secondary | ICD-10-CM | POA: Diagnosis not present

## 2016-02-26 DIAGNOSIS — F1910 Other psychoactive substance abuse, uncomplicated: Secondary | ICD-10-CM | POA: Diagnosis not present

## 2016-02-26 DIAGNOSIS — F1410 Cocaine abuse, uncomplicated: Secondary | ICD-10-CM | POA: Insufficient documentation

## 2016-02-26 DIAGNOSIS — K219 Gastro-esophageal reflux disease without esophagitis: Secondary | ICD-10-CM | POA: Insufficient documentation

## 2016-02-26 DIAGNOSIS — Z803 Family history of malignant neoplasm of breast: Secondary | ICD-10-CM | POA: Insufficient documentation

## 2016-02-26 DIAGNOSIS — N183 Chronic kidney disease, stage 3 (moderate): Secondary | ICD-10-CM | POA: Insufficient documentation

## 2016-02-26 DIAGNOSIS — F14929 Cocaine use, unspecified with intoxication, unspecified: Secondary | ICD-10-CM

## 2016-02-26 DIAGNOSIS — G43909 Migraine, unspecified, not intractable, without status migrainosus: Secondary | ICD-10-CM | POA: Insufficient documentation

## 2016-02-26 DIAGNOSIS — E860 Dehydration: Secondary | ICD-10-CM | POA: Diagnosis not present

## 2016-02-26 DIAGNOSIS — M6282 Rhabdomyolysis: Secondary | ICD-10-CM | POA: Insufficient documentation

## 2016-02-26 DIAGNOSIS — Z885 Allergy status to narcotic agent status: Secondary | ICD-10-CM | POA: Diagnosis not present

## 2016-02-26 DIAGNOSIS — N184 Chronic kidney disease, stage 4 (severe): Secondary | ICD-10-CM

## 2016-02-26 DIAGNOSIS — F309 Manic episode, unspecified: Secondary | ICD-10-CM

## 2016-02-26 DIAGNOSIS — D571 Sickle-cell disease without crisis: Secondary | ICD-10-CM | POA: Diagnosis present

## 2016-02-26 DIAGNOSIS — N189 Chronic kidney disease, unspecified: Secondary | ICD-10-CM

## 2016-02-26 DIAGNOSIS — F1494 Cocaine use, unspecified with cocaine-induced mood disorder: Secondary | ICD-10-CM | POA: Diagnosis not present

## 2016-02-26 DIAGNOSIS — Z79899 Other long term (current) drug therapy: Secondary | ICD-10-CM | POA: Diagnosis not present

## 2016-02-26 DIAGNOSIS — E8729 Other acidosis: Secondary | ICD-10-CM

## 2016-02-26 LAB — TSH: TSH: 0.584 u[IU]/mL (ref 0.350–4.500)

## 2016-02-26 LAB — COMPREHENSIVE METABOLIC PANEL
ALBUMIN: 4 g/dL (ref 3.5–5.0)
ALT: 38 U/L (ref 17–63)
AST: 61 U/L — AB (ref 15–41)
Alkaline Phosphatase: 143 U/L — ABNORMAL HIGH (ref 38–126)
Anion gap: 20 — ABNORMAL HIGH (ref 5–15)
BILIRUBIN TOTAL: 3.9 mg/dL — AB (ref 0.3–1.2)
BUN: 48 mg/dL — AB (ref 6–20)
CO2: 13 mmol/L — ABNORMAL LOW (ref 22–32)
CREATININE: 3.63 mg/dL — AB (ref 0.61–1.24)
Calcium: 8.5 mg/dL — ABNORMAL LOW (ref 8.9–10.3)
Chloride: 104 mmol/L (ref 101–111)
GFR calc Af Amer: 24 mL/min — ABNORMAL LOW (ref >60)
GFR, EST NON AFRICAN AMERICAN: 20 mL/min — AB (ref >60)
GLUCOSE: 70 mg/dL (ref 65–99)
Potassium: 4.3 mmol/L (ref 3.5–5.1)
Sodium: 137 mmol/L (ref 135–145)
TOTAL PROTEIN: 7 g/dL (ref 6.5–8.1)

## 2016-02-26 LAB — I-STAT CG4 LACTIC ACID, ED: LACTIC ACID, VENOUS: 0.94 mmol/L (ref 0.5–1.9)

## 2016-02-26 LAB — CBC
HEMATOCRIT: 21.3 % — AB (ref 39.0–52.0)
HEMOGLOBIN: 7.6 g/dL — AB (ref 13.0–17.0)
MCH: 46.6 pg — AB (ref 26.0–34.0)
MCHC: 35.7 g/dL (ref 30.0–36.0)
MCV: 130.7 fL — ABNORMAL HIGH (ref 78.0–100.0)
Platelets: 387 10*3/uL (ref 150–400)
RBC: 1.63 MIL/uL — ABNORMAL LOW (ref 4.22–5.81)
RDW: 17.4 % — AB (ref 11.5–15.5)
WBC: 11.2 10*3/uL — ABNORMAL HIGH (ref 4.0–10.5)

## 2016-02-26 LAB — RAPID URINE DRUG SCREEN, HOSP PERFORMED
Amphetamines: NOT DETECTED
BARBITURATES: NOT DETECTED
Benzodiazepines: NOT DETECTED
COCAINE: POSITIVE — AB
OPIATES: NOT DETECTED
TETRAHYDROCANNABINOL: NOT DETECTED

## 2016-02-26 LAB — ETHANOL

## 2016-02-26 LAB — CK: Total CK: 754 U/L — ABNORMAL HIGH (ref 49–397)

## 2016-02-26 MED ORDER — LORAZEPAM 2 MG/ML IJ SOLN
1.0000 mg | Freq: Once | INTRAMUSCULAR | Status: AC
  Administered 2016-02-26: 1 mg via INTRAVENOUS
  Filled 2016-02-26: qty 1

## 2016-02-26 MED ORDER — ACETAMINOPHEN 325 MG PO TABS: 650.0000 mg | ORAL_TABLET | Freq: Four times a day (QID) | ORAL | Status: DC | PRN

## 2016-02-26 MED ORDER — HEPARIN SODIUM (PORCINE) 5000 UNIT/ML IJ SOLN
5000.0000 [IU] | Freq: Three times a day (TID) | INTRAMUSCULAR | Status: DC
  Administered 2016-02-27: 5000 [IU] via SUBCUTANEOUS
  Filled 2016-02-26: qty 1

## 2016-02-26 MED ORDER — SODIUM CHLORIDE 0.9 % IV SOLN
INTRAVENOUS | Status: DC
  Administered 2016-02-27: 01:00:00 via INTRAVENOUS

## 2016-02-26 MED ORDER — LORAZEPAM 2 MG/ML IJ SOLN
1.0000 mg | INTRAMUSCULAR | Status: DC | PRN
  Administered 2016-02-26: 1 mg via INTRAVENOUS
  Filled 2016-02-26: qty 1

## 2016-02-26 MED ORDER — SODIUM CHLORIDE 0.9 % IV BOLUS (SEPSIS)
1000.0000 mL | Freq: Once | INTRAVENOUS | Status: AC
  Administered 2016-02-26: 1000 mL via INTRAVENOUS

## 2016-02-26 MED ORDER — NORTRIPTYLINE HCL 25 MG PO CAPS: 25.0000 mg | ORAL_CAPSULE | Freq: Every day | ORAL | Status: DC

## 2016-02-26 MED ORDER — HYDROXYUREA 500 MG PO CAPS
1500.0000 mg | ORAL_CAPSULE | Freq: Every day | ORAL | Status: DC
  Administered 2016-02-27: 1500 mg via ORAL
  Filled 2016-02-26 (×2): qty 3

## 2016-02-26 MED ORDER — SODIUM CHLORIDE 0.9% FLUSH
3.0000 mL | Freq: Two times a day (BID) | INTRAVENOUS | Status: DC
  Administered 2016-02-27: 3 mL via INTRAVENOUS

## 2016-02-26 MED ORDER — ACETAMINOPHEN 500 MG PO TABS
1000.0000 mg | ORAL_TABLET | Freq: Once | ORAL | Status: AC
  Administered 2016-02-26: 1000 mg via ORAL
  Filled 2016-02-26: qty 2

## 2016-02-26 NOTE — H&P (Signed)
History and Physical    [name] A [employer] LKG:[id number] DOB: Dec 24, 1980 DOA: 02/26/2016   PCP: Billee Cashing, MD Chief Complaint:  Chief Complaint  Patient presents with  . possible OD/ ingestion    HPI: [name] Pick. is a 36 y.o. male with medical history significant of sickle cell anemia, polysubstance abuse.  Patient presents to the ED essentially manic since 10 PM last evening.  Thinks he may have ingested something with his "2 cigarettes and a beer" last night.  Nothing tried for symptoms, nothing makes symptoms better or worse, symptoms are persistent since onset.  ED Course: Patient calmed down and now resting comfortably post ativan.  Lab work shows AKI.  Review of Systems: As per HPI otherwise 10 point review of systems negative.    Past Medical History:  Diagnosis Date  . Bacterial pneumonia ~ 2012   "caught it here in the hospital" (09/25/2012)  . Chronic kidney disease    "from my sickle cell" (09/25/2012)  . Cyst of brain    "2 really small ones in the back of my head; inside; saw them w/MRI" (09/25/2012)  . GERD (gastroesophageal reflux disease)    "after I eat alot of spicey foods" (09/25/2012)  . History of blood transfusion    "always related to sickle cell crisis" (09/25/2012)  . Migraines    "take RX qd to prevent them" (09/25/2012)  . Sickle cell anemia (HCC)   . Sickle cell crisis (HCC) 09/25/2012    Past Surgical History:  Procedure Laterality Date  . CHOLECYSTECTOMY  ~ 2012     reports that he has been smoking Cigarettes.  He has never used smokeless tobacco. He reports that he drinks alcohol. He reports that he uses drugs, including Marijuana.  Allergies  Allergen Reactions  . Morphine And Related Other (See Comments)    "real bad headaches"    Family History  Problem Relation Age of Onset  . Breast cancer Mother       Prior to Admission medications   Medication Sig Start Date End Date Taking? Authorizing Provider    enalapril (VASOTEC) 5 MG tablet Take 1 tablet (5 mg total) by mouth daily. 05/13/14  Yes Nani Ravens, MD  hydroxyurea (HYDREA) 500 MG capsule TAKE 3 CAPSULES (1,500 MG TOTAL) BY MOUTH DAILY. 12/14/15  Yes Reva Bores, MD  naproxen sodium (ANAPROX) 220 MG tablet Take 440 mg by mouth as needed (for pain).   Yes Historical Provider, MD  nortriptyline (PAMELOR) 25 MG capsule TAKE 1 CAPSULE (25 MG TOTAL) BY MOUTH AT BEDTIME. 12/28/15  Yes Reva Bores, MD  oxyCODONE-acetaminophen (PERCOCET/ROXICET) 5-325 MG tablet Take 1 tablet by mouth every 6 (six) hours as needed for severe pain. 12/03/15  Yes Charlestine Night, PA-C    Physical Exam: Vitals:   02/26/16 1659 02/26/16 2100 02/26/16 2130  BP: 160/77 125/85 135/64  Pulse: 120 116 119  Resp: 20    Temp: 99.6 F (37.6 C)    TempSrc: Oral    SpO2: 99% 98% 98%      Constitutional: NAD, calm, comfortable, resting comfortably post ativan, will wake up when aroused then go back asleep. Eyes: PERRL, lids and conjunctivae normal ENMT: Mucous membranes are moist. Posterior pharynx clear of any exudate or lesions.Normal dentition.  Neck: normal, supple, no masses, no thyromegaly Respiratory: clear to auscultation bilaterally, no wheezing, no crackles. Normal respiratory effort. No accessory muscle use.  Cardiovascular: Regular rate and rhythm, no murmurs / rubs / gallops.  No extremity edema. 2+ pedal pulses. No carotid bruits.  Abdomen: no tenderness, no masses palpated. No hepatosplenomegaly. Bowel sounds positive.  Musculoskeletal: no clubbing / cyanosis. No joint deformity upper and lower extremities. Good ROM, no contractures. Normal muscle tone.  Skin: no rashes, lesions, ulcers. No induration Neurologic: CN 2-12 grossly intact. Sensation intact, DTR normal. Strength 5/5 in all 4.  Psychiatric: Resting comfortably post ativan.   Labs on Admission: I have personally reviewed following labs and imaging studies  CBC:  Recent Labs Lab  02/26/16 1722  WBC 11.2*  HGB 7.6*  HCT 21.3*  MCV 130.7*  PLT 387   Basic Metabolic Panel:  Recent Labs Lab 02/26/16 1722  NA 137  K 4.3  CL 104  CO2 13*  GLUCOSE 70  BUN 48*  CREATININE 3.63*  CALCIUM 8.5*   GFR: CrCl cannot be calculated (Unknown ideal weight.). Liver Function Tests:  Recent Labs Lab 02/26/16 1722  AST 61*  ALT 38  ALKPHOS 143*  BILITOT 3.9*  PROT 7.0  ALBUMIN 4.0   No results for input(s): LIPASE, AMYLASE in the last 168 hours. No results for input(s): AMMONIA in the last 168 hours. Coagulation Profile: No results for input(s): INR, PROTIME in the last 168 hours. Cardiac Enzymes:  Recent Labs Lab 02/26/16 2051  CKTOTAL 754*   BNP (last 3 results) No results for input(s): PROBNP in the last 8760 hours. HbA1C: No results for input(s): HGBA1C in the last 72 hours. CBG: No results for input(s): GLUCAP in the last 168 hours. Lipid Profile: No results for input(s): CHOL, HDL, LDLCALC, TRIG, CHOLHDL, LDLDIRECT in the last 72 hours. Thyroid Function Tests:  Recent Labs  02/26/16 2051  TSH 0.584   Anemia Panel: No results for input(s): VITAMINB12, FOLATE, FERRITIN, TIBC, IRON, RETICCTPCT in the last 72 hours. Urine analysis:    Component Value Date/Time   COLORURINE YELLOW 11/21/2014 2003   APPEARANCEUR CLEAR 11/21/2014 2003   LABSPEC 1.010 11/21/2014 2003   PHURINE 6.0 11/21/2014 2003   GLUCOSEU 100 (A) 11/21/2014 2003   HGBUR NEGATIVE 11/21/2014 2003   HGBUR small 03/10/2010 1445   BILIRUBINUR NEGATIVE 11/21/2014 2003   KETONESUR NEGATIVE 11/21/2014 2003   PROTEINUR NEGATIVE 11/21/2014 2003   UROBILINOGEN 0.2 11/21/2014 2003   NITRITE NEGATIVE 11/21/2014 2003   LEUKOCYTESUR NEGATIVE 11/21/2014 2003   Sepsis Labs: @LABRCNTIP (procalcitonin:4,lacticidven:4) )No results found for this or any previous visit (from the past 240 hour(s)).   Radiological Exams on Admission: No results found.  EKG: Independently  reviewed.  Assessment/Plan Principal Problem:   Acute-on-chronic kidney injury (HCC) Active Problems:   Sickle cell disease, type SS. 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 AKI on CKD stage 3 - creat up to 3.6 from baseline of about 1.7. 1. Likely cocaine induced kidney injury 2. Alternatively due to NSAID use which he likely uses chronically to help control pain from HGB-SS disease 3. Hold NSAIDS - may want to stop using these permanently. 4. Hold lisinopril 5. Gentle hydration 6. Repeat BMP in AM 2. Cocaine psychosis - (or other substance), PRN ativan, the 1mg  dose given in the ED seems to be working very well. 3. HGB-SS disease - 1. Not apparently in acute crisis 2. Not really a great candidate for narcotics anyhow given polysubstance abuse which is clearly an ongoing issue.  DVT prophylaxis: Heparin [place] Code Status: Full Family Communication: no family in room Consults called: None Admission status: Admit to obs   Kallon Caylor, Heywood Iles DO [facility] Pager [phone] from 7PM-7AM  If 7AM-7PM, please contact the day physician for the patient [email or web address] Password [facility]  02/26/2016, 10:03 PM

## 2016-02-26 NOTE — ED Provider Notes (Signed)
[facility] DEPT Provider Note   CSN: [id number] Arrival date & time: 02/26/16  1642     History   Chief Complaint Chief Complaint  Patient presents with  . possible OD/ ingestion    HPI [name] Chill. is a 36 y.o. male who presents with mania and hallucinations. He has a pmh of Sickle cell SS and Polysubstance abuse. The patient states that last night he was pale with a friend and smoked the end of his marijuana blunt, got two cigarettes and drank a beer which he thinks may have been laced with something. His mother and girlfriend state that since 50 PM last night. He has been unable to sit still, with rapid pressured speech, hallucinating that voices were coming out of the wall. He has not slept at all since yesterday. They state that they have never seen him like this before. They state that he has had nonsensical words. The patient has had excessive thirst and dry throat. He denies any pain. He does state that his feet hurt, however, and his mother thinks because he has been walking "nonstop."   HPI  Past Medical History:  Diagnosis Date  . Bacterial pneumonia ~ 2012   "caught it here in the hospital" (09/25/2012)  . Chronic kidney disease    "from my sickle cell" (09/25/2012)  . Cyst of brain    "2 really small ones in the back of my head; inside; saw them w/MRI" (09/25/2012)  . GERD (gastroesophageal reflux disease)    "after I eat alot of spicey foods" (09/25/2012)  . History of blood transfusion    "always related to sickle cell crisis" (09/25/2012)  . Migraines    "take RX qd to prevent them" (09/25/2012)  . Sickle cell anemia (HCC)   . Sickle cell crisis ([place]) 09/25/2012    Patient Active Problem List   Diagnosis Date Noted  . Sickle cell anemia with crisis ([place]) 03/11/2015  . Cocaine use   . Sickle cell pain crisis ([place]) 11/21/2014  . Hb-SS disease with acute chest syndrome ([place])   . AKI (acute kidney injury) ([place])   . Hb-SS disease with crisis ([place])   .  Sickle cell anemia ([place]) 08/18/2014  . Nausea with vomiting   . Polysubstance abuse 03/22/2014  . Chest pain   . Sickle cell crisis ([place]) 03/19/2014  . Vaso-occlusive sickle cell crisis ([place]) 05/09/2013  . Cocaine abuse 09/26/2012  . Acute-on-chronic kidney injury ([place]) 09/26/2012  . Systolic murmur 70/35/0093  . Migraine 11/26/2009  . CHRONIC KIDNEY DISEASE STAGE II (MILD) 03/06/2009  . Sickle cell disease, type SS. No outpatient [facility] due to cocaine positive, opiate negative on high doses of given narcotics 06/18/2008  . TOBACCO ABUSE 05/22/2007    Past Surgical History:  Procedure Laterality Date  . CHOLECYSTECTOMY  ~ 2012       Home Medications    Prior to Admission medications   Medication Sig Start Date End Date Taking? Authorizing Provider  enalapril (VASOTEC) 5 MG tablet Take 1 tablet (5 mg total) by mouth daily. 05/13/14  Yes Leone Brand, MD  hydroxyurea (HYDREA) 500 MG capsule TAKE 3 CAPSULES (1,500 MG TOTAL) BY MOUTH DAILY. 12/14/15  Yes Donnamae Jude, MD  naproxen sodium (ANAPROX) 220 MG tablet Take 440 mg by mouth as needed (for pain).   Yes Historical Provider, MD  nortriptyline (PAMELOR) 25 MG capsule TAKE 1 CAPSULE (25 MG TOTAL) BY MOUTH AT BEDTIME. 12/28/15  Yes Donnamae Jude,  MD  oxyCODONE-acetaminophen (PERCOCET/ROXICET) 5-325 MG tablet Take 1 tablet by mouth every 6 (six) hours as needed for severe pain. 12/03/15  Yes Christopher Lawyer, PA-C  oxyCODONE (OXY IR/ROXICODONE) 5 MG immediate release tablet Take 1 tablet (5 mg total) by mouth every 4 (four) hours as needed for breakthrough pain. Patient not taking: Reported on 02/26/2016 03/15/15   Sela Hua, MD    Family History Family History  Problem Relation Age of Onset  . Breast cancer Mother     Social History Social History  Substance Use Topics  . Smoking status: Current Some Day Smoker    Types: Cigarettes  . Smokeless tobacco: Never Used     Comment: 09/25/2012 "I don't  buy cigarettes; bum one from friends q now and then"  . Alcohol use 0.0 oz/week     Comment: occasionally     Allergies   Morphine and related   Review of Systems Review of Systems  Ten systems reviewed and are negative for acute change, except as noted in the HPI.   Physical Exam Updated Vital Signs BP 160/77   Pulse 120   Temp 99.6 F (37.6 C) (Oral)   Resp 20   SpO2 99%   Physical Exam  Constitutional: He is oriented to person, place, and time. He appears well-developed and well-nourished.  HENT:  Head: Normocephalic and atraumatic.  Patients eyes are very wide.  Eyes: EOM are normal. Pupils are equal, round, and reactive to light.  Patients eyes are very wide.  Neck: Normal range of motion. Neck supple.  Cardiovascular:  Tachycardic  Pulmonary/Chest: Effort normal and breath sounds normal.  Abdominal: Soft. Bowel sounds are normal. He exhibits no distension.  Neurological: He is alert and oriented to person, place, and time.  Skin: Skin is warm and dry. Capillary refill takes less than 2 seconds.  Patient unable to sit still in the bed with akathisia-like movements. His speech is rapid and pressured and difficult to follow. He does not appear to be actively responding to internal stimuli at this time  Nursing note and vitals reviewed.    ED Treatments / Results  Labs (all labs ordered are listed, but only abnormal results are displayed) Labs Reviewed  COMPREHENSIVE METABOLIC PANEL - Abnormal; Notable for the following:       Result Value   CO2 13 (*)    BUN 48 (*)    Creatinine, Ser 3.63 (*)    Calcium 8.5 (*)    AST 61 (*)    Alkaline Phosphatase 143 (*)    Total Bilirubin 3.9 (*)    GFR calc non Af Amer 20 (*)    GFR calc Af Amer 24 (*)    Anion gap 20 (*)    All other components within normal limits  CBC - Abnormal; Notable for the following:    WBC 11.2 (*)    RBC 1.63 (*)    Hemoglobin 7.6 (*)    HCT 21.3 (*)    MCV 130.7 (*)    MCH 46.6 (*)     RDW 17.4 (*)    All other components within normal limits  RAPID URINE DRUG SCREEN, HOSP PERFORMED - Abnormal; Notable for the following:    Cocaine POSITIVE (*)    All other components within normal limits  ETHANOL  CK    EKG  EKG Interpretation None       Radiology No results found.  Procedures Procedures (including critical care time)  Medications Ordered in ED  Medications  sodium chloride 0.9 % bolus 1,000 mL (not administered)     Initial Impression / Assessment and Plan / ED Course  I have reviewed the triage vital signs and the nursing notes.  Pertinent labs & imaging results that were available during my care of the patient were reviewed by me and considered in my medical decision making (see chart for details).  Clinical Course as of Feb 25 2126  Sat Feb 26, 2016  2045 Patient UDS positive for cocaine COCAINE: (!) POSITIVE [AH]  2046 The patient's serum creatinine is almost double his normal level. Creatinine: (!) 3.63 [AH]  2046 BUN is elevated BUN: (!) 48 [AH]  2046 High anion gap acidosis may be secondary to uremia. We'll add on a lactic acid. Anion gap: (!) 20 [AH]    Clinical Course User Index [AH] Margarita Mail, PA-C     patient with AKI, anion gap acidosis, mania. Given ativan. Patient will need admission.  Likely from Uremia. Patient admitted by Dr. Alcario Drought for observation.  Final Clinical Impressions(s) / ED Diagnoses   Final diagnoses:  Acute kidney injury ([place])  Mania ([place])  Increased anion gap metabolic acidosis    New Prescriptions New Prescriptions   No medications on file     Margarita Mail, PA-C 02/26/16 2149    Pattricia Boss, MD 02/27/16 [phone]

## 2016-02-26 NOTE — ED Triage Notes (Addendum)
Patient arrived with word salad and rapid speech and hand gestures. States that this all started after drinking etoh and smoking a cigarette last night. Has been wide awake and manic x 15 hours. States that he wonders if he ingested unknown substance. Alert and oriented on arrival, denies pain. NAD.

## 2016-02-26 NOTE — Progress Notes (Signed)
Received report from ED RN, Aaron Edelman.

## 2016-02-27 DIAGNOSIS — M6282 Rhabdomyolysis: Secondary | ICD-10-CM

## 2016-02-27 DIAGNOSIS — N179 Acute kidney failure, unspecified: Principal | ICD-10-CM

## 2016-02-27 DIAGNOSIS — F1910 Other psychoactive substance abuse, uncomplicated: Secondary | ICD-10-CM

## 2016-02-27 DIAGNOSIS — N183 Chronic kidney disease, stage 3 (moderate): Secondary | ICD-10-CM | POA: Diagnosis not present

## 2016-02-27 DIAGNOSIS — N184 Chronic kidney disease, stage 4 (severe): Secondary | ICD-10-CM

## 2016-02-27 DIAGNOSIS — F1494 Cocaine use, unspecified with cocaine-induced mood disorder: Secondary | ICD-10-CM

## 2016-02-27 DIAGNOSIS — D571 Sickle-cell disease without crisis: Secondary | ICD-10-CM

## 2016-02-27 DIAGNOSIS — E8729 Other acidosis: Secondary | ICD-10-CM

## 2016-02-27 DIAGNOSIS — E872 Acidosis: Secondary | ICD-10-CM

## 2016-02-27 DIAGNOSIS — F1410 Cocaine abuse, uncomplicated: Secondary | ICD-10-CM

## 2016-02-27 DIAGNOSIS — N189 Chronic kidney disease, unspecified: Secondary | ICD-10-CM

## 2016-02-27 DIAGNOSIS — F1994 Other psychoactive substance use, unspecified with psychoactive substance-induced mood disorder: Secondary | ICD-10-CM

## 2016-02-27 DIAGNOSIS — N170 Acute kidney failure with tubular necrosis: Secondary | ICD-10-CM

## 2016-02-27 LAB — CBC
HCT: 22 % — ABNORMAL LOW (ref 39.0–52.0)
Hemoglobin: 7.9 g/dL — ABNORMAL LOW (ref 13.0–17.0)
MCH: 47 pg — ABNORMAL HIGH (ref 26.0–34.0)
MCHC: 35.9 g/dL (ref 30.0–36.0)
MCV: 131 fL — ABNORMAL HIGH (ref 78.0–100.0)
PLATELETS: 362 10*3/uL (ref 150–400)
RBC: 1.68 MIL/uL — AB (ref 4.22–5.81)
RDW: 17 % — AB (ref 11.5–15.5)
WBC: 11.6 10*3/uL — AB (ref 4.0–10.5)

## 2016-02-27 LAB — BASIC METABOLIC PANEL
ANION GAP: 14 (ref 5–15)
BUN: 39 mg/dL — ABNORMAL HIGH (ref 6–20)
CALCIUM: 8.5 mg/dL — AB (ref 8.9–10.3)
CO2: 16 mmol/L — ABNORMAL LOW (ref 22–32)
Chloride: 110 mmol/L (ref 101–111)
Creatinine, Ser: 2.46 mg/dL — ABNORMAL HIGH (ref 0.61–1.24)
GFR, EST AFRICAN AMERICAN: 38 mL/min — AB (ref >60)
GFR, EST NON AFRICAN AMERICAN: 33 mL/min — AB (ref >60)
Glucose, Bld: 67 mg/dL (ref 65–99)
POTASSIUM: 5 mmol/L (ref 3.5–5.1)
Sodium: 140 mmol/L (ref 135–145)

## 2016-02-27 MED ORDER — SODIUM BICARBONATE 650 MG PO TABS: 650.0000 mg | ORAL_TABLET | Freq: Two times a day (BID) | ORAL | 0 refills | Status: DC

## 2016-02-27 MED ORDER — SODIUM BICARBONATE 650 MG PO TABS
650.0000 mg | ORAL_TABLET | Freq: Two times a day (BID) | ORAL | Status: DC
  Administered 2016-02-27: 650 mg via ORAL
  Filled 2016-02-27: qty 1

## 2016-02-27 NOTE — Discharge Summary (Signed)
Physician Discharge Summary  [name] [name]. TML:[id number] DOB: November 06, 1980 DOA: 02/26/2016  PCP: [name], MD  Admit date: 02/26/2016 Discharge date: 02/27/2016  Admitted From: Home Disposition: Home  Recommendations for Outpatient Follow-up:  1. Follow up with PCP in 1-2 weeks 2. Please monitor renal function during outpatient follow-up. I have discontinued his ACE inhibitor and instructed to avoid NSAIDs given his worsened renal function.  Home Health: None Equipment/Devices: None  Discharge Condition: Stable CODE STATUS: Full code Diet recommendation: Regular   Discharge Diagnoses:  Principal Problem:   Acute-on-chronic kidney injury ([place])   Active Problems:   Sickle cell disease, type SS. 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 Patient went out to party with friends and ingested something with beer and cigarettes. As per girlfriend he came back home and separate woke up being extremely restless and agitated. He had rapid pressured speech, hallucinating that voices were coming out of the wall, unable to sleep or sit still. No prior similar symptoms. He did not have any fevers, chills, nausea, vomiting, chest pain, shortness of breath, abdominal pain, bowel or urinary symptoms. No witnessed seizures.   Course in the ED Patient was tachycardic in the ED with low-grade fever. He was given some Ativan after which he calmed down. Blood work showed a recent 11.2, and [place] 7.6, normal platelet. Chemistry showed CO2 of 13 with anion gap of 20. He was found to have BUN of 48 and creatinine of 2.63 (baseline  creatinine of 1.7) given IV fluids and placed on observation on telemetry. CT head was unremarkable. Urine drug screen was positive for cocaine and CPK was elevated and 700s. Alcohol level was negative.  Hospital course Acute on chronic kidney disease stage III Secondary to dehydration and cocaine induced ATN. Also takes NSAIDs and ACE inhibitor which were discontinued. Placed on IV hydration and patient's creatinine improved to 2.46 this morning. Instructed to avoid both ACE inhibitor and NSAIDs for now. Needs to follow-up with his PCP in 1 week and have her renal functions checked.  Anion gap metabolic acidosis Secondary to ATN and dehydration. Improving with IV fluids. Will prescribe 3 days of oral sodium bicarbonate.   Sickle cell anemia Hemoglobin at baseline.  Polysubstance abuse with possible cocaine psychosis. Mentation improved to baseline. He is sleepy on exam today. On questioning about his cocaine use patient became  angry saying " do I look like I do cocaine to you?". Patient was instructed on quitting smoking, alcohol use and polysubstance use.   Rhabdomyolysis Secondary to cocaine. Given IV fluids.   Stable to be discharged home with outpatient PCP follow-up.   Family communication: Both no bedside  Procedure: Head CT Disposition: Home     Discharge Instructions   Allergies as of 02/27/2016      Reactions   Morphine And Related Other (See Comments)   "real bad headaches"      Medication List    STOP taking these medications   enalapril 5 MG tablet Commonly known as:  VASOTEC   naproxen sodium 220 MG tablet Commonly known as:  ANAPROX     TAKE these medications   hydroxyurea 500 MG capsule Commonly  known as:  HYDREA TAKE 3 CAPSULES (1,500 MG TOTAL) BY MOUTH DAILY.   nortriptyline 25 MG capsule Commonly known as:  PAMELOR TAKE 1 CAPSULE (25 MG TOTAL) BY MOUTH AT BEDTIME.   oxyCODONE-acetaminophen 5-325 MG tablet Commonly known as:   PERCOCET/ROXICET Take 1 tablet by mouth every 6 (six) hours as needed for severe pain.   sodium bicarbonate 650 MG tablet Take 1 tablet (650 mg total) by mouth 2 (two) times daily.      Follow-up Information    [name], MD. Schedule an appointment as soon as possible for a visit in 1 week(s).   Specialty:  Family Medicine Contact information: [address] [place] [address] [phone]          Allergies  Allergen Reactions  . Morphine And Related Other (See Comments)    "real bad headaches"      Subjective: Sleepy but easily arousable, oriented, irritable  Discharge Exam: Vitals:   02/27/16 0012 02/27/16 0506  BP: 110/63 111/69  Pulse: (!) 117 (!) 102  Resp: 16 19  Temp: 97.4 F (36.3 C) 98.2 F (36.8 C)   Vitals:   02/26/16 2300 02/26/16 2315 02/27/16 0012 02/27/16 0506  BP: 136/82 136/87 110/63 111/69  Pulse:  115 (!) 117 (!) 102  Resp:   16 19  Temp:   97.4 F (36.3 C) 98.2 F (36.8 C)  TempSrc:   Oral   SpO2:  94% 93% 100%  Weight:   53.8 kg (118 lb 9.7 oz)   Height:   5\' 3"  (1.6 m)     General: Middle aged male not in distress, irritable on questioning ENT: The present, icteric, moist mucosa, supple neck Cardiovascular: Normal S1 and S2, no murmurs or gallop Chest: Clear to auscultation bilaterally, no added sounds GI: Soft, NT, ND, bowel sounds + Musculoskeletal: Warm, no edema CNS: Alert and oriented    The results of significant diagnostics from this hospitalization (including imaging, microbiology, ancillary and laboratory) are listed below for reference.     Microbiology: No results found for this or any previous visit (from the past 240 hour(s)).   Labs: BNP (last 3 results) No results for input(s): BNP in the last 8760 hours. Basic Metabolic Panel:  Recent Labs Lab 02/26/16 1722 02/27/16 0538  NA 137 140  K 4.3 5.0  CL 104 110  CO2 13* 16*  GLUCOSE 70 67  BUN 48* 39*  CREATININE 3.63* 2.46*   CALCIUM 8.5* 8.5*   Liver Function Tests:  Recent Labs Lab 02/26/16 1722  AST 61*  ALT 38  ALKPHOS 143*  BILITOT 3.9*  PROT 7.0  ALBUMIN 4.0   No results for input(s): LIPASE, AMYLASE in the last 168 hours. No results for input(s): AMMONIA in the last 168 hours. CBC:  Recent Labs Lab 02/26/16 1722 02/27/16 0538  WBC 11.2* 11.6*  HGB 7.6* 7.9*  HCT 21.3* 22.0*  MCV 130.7* 131.0*  PLT 387 362   Cardiac Enzymes:  Recent Labs Lab 02/26/16 2051  CKTOTAL 754*   BNP: Invalid input(s): POCBNP CBG: No results for input(s): GLUCAP in the last 168 hours. D-Dimer No results for input(s): DDIMER in the last 72 hours. Hgb A1c No results for input(s): HGBA1C in the last 72 hours. Lipid Profile No results for input(s): CHOL, HDL, LDLCALC, TRIG, CHOLHDL, LDLDIRECT in the last 72 hours. Thyroid function studies  Recent Labs  02/26/16 2051  TSH 0.584   Anemia work up No results for input(s): VITAMINB12,  FOLATE, FERRITIN, TIBC, IRON, RETICCTPCT in the last 72 hours. Urinalysis    Component Value Date/Time   COLORURINE YELLOW 11/21/2014 2003   APPEARANCEUR CLEAR 11/21/2014 2003   LABSPEC 1.010 11/21/2014 2003   PHURINE 6.0 11/21/2014 2003   GLUCOSEU 100 (A) 11/21/2014 2003   HGBUR NEGATIVE 11/21/2014 2003   HGBUR small 03/10/2010 [place] 11/21/2014 2003   [place] 11/21/2014 2003   PROTEINUR NEGATIVE 11/21/2014 2003   UROBILINOGEN 0.2 11/21/2014 2003   NITRITE NEGATIVE 11/21/2014 2003   LEUKOCYTESUR NEGATIVE 11/21/2014 2003   Sepsis Labs Invalid input(s): PROCALCITONIN,  WBC,  LACTICIDVEN Microbiology No results found for this or any previous visit (from the past 240 hour(s)).   Time coordinating discharge: < 30 minutes  SIGNED:   Louellen Molder, MD  [facility] 02/27/2016, 9:59 AM Pager   If 7PM-7AM, please contact night-coverage [email or web address] Password [email or web address]

## 2016-02-27 NOTE — Discharge Instructions (Signed)
Acute Kidney Injury, Adult °Acute kidney injury (AKI) occurs when there is sudden (acute) damage to the kidneys. A small amount of kidney damage may not cause problems, but a large amount of damage may make it difficult or impossible for the kidneys to work the way they should. AKI may develop into long-lasting (chronic) kidney disease. Early detection and treatment of AKI may prevent kidney damage from becoming permanent or getting worse. °What are the causes? °Common causes of this condition include: °· A problem with blood flow to the kidneys. This may be caused by: °¨ Blood loss. °¨ Heart and blood vessel (cardiovascular) disease. °¨ Severe burns. °¨ Liver disease. °· Direct damage to the kidneys. This may be caused by: °¨ Some medicines. °¨ A kidney infection. °¨ Poisoning. °¨ Being around or in contact with poisonous (toxic) substances. °¨ A surgical wound. °¨ A hard, direct force to the kidney area. °· A sudden blockage of urine flow. This may be caused by: °¨ Cancer. °¨ Kidney stones. °¨ Enlarged prostate in males. °What are the signs or symptoms? °Symptoms develop slowly and may not be obvious until the kidney damage becomes severe. It is possible to have AKI for years without showing any symptoms. Symptoms of this condition can include: °· Swelling (edema) of the face, legs, ankles, or feet. °· Numbness, tingling, or loss of feeling (sensation) in the hands or feet. °· Tiredness (lethargy). °· Nausea or vomiting. °· Confusion or trouble concentrating. °· Problems with urination, such as: °¨ Painful or burning feeling during urination. °¨ Decreased urine production. °¨ Frequent urination, especially at night. °¨ Bloody urine. °· Muscle twitches and cramps, especially in the legs. °· Shortness of breath. °· Weakness. °· Constant itchiness. °· Loss of appetite. °· Metallic taste in the mouth. °· Trouble sleeping. °· Pale lining of the eyelids and surface of the eye (conjunctiva). °How is this diagnosed? °This  condition may be diagnosed with various tests. Tests may include: °· Blood tests. °· Urine tests. °· Imaging tests. °· A test in which a sample of tissue is removed from the kidneys to be looked at under a microscope (kidney biopsy). °How is this treated? °Treatment of AKI varies depending on the cause and severity of the kidney damage. In mild cases, treatment may not be needed. The kidneys may heal on their own. °If AKI is more severe, your health care provider will treat the cause of the kidney damage, help the kidneys heal, and prevent problems from occurring. Severe cases may require a procedure to remove toxic wastes from the body (dialysis) or surgery to repair kidney damage. Surgery may involve: °· Repair of a torn kidney. °· Removal of a urine flow obstruction. °Follow these instructions at home: °· Follow your prescribed diet. °· Take over-the-counter and prescription medicines only as told by your health care provider. °¨ Do not take any new medicines unless approved by your health care provider. Many medicines can worsen your kidney damage. °¨ Do not take any vitamin and mineral supplements unless approved by your health care provider. Many nutritional supplements can worsen your kidney damage. °¨ The dose of some medicines that you take may need to be adjusted. °· Do not use any tobacco products, such as cigarettes, chewing tobacco, and e-cigarettes. If you need help quitting, ask your health care provider. °· Keep all follow-up visits as told by your health care provider. This is important. °· Keep track of your blood pressure. Report changes in your blood pressure as told by your   health care provider. °· Achieve and maintain a healthy weight. If you need help with this, ask your health care provider. °· Start or continue an exercise plan. Try to exercise at least 30 minutes a day, 5 days a week. °· Stay current with immunizations as told by your health care provider. °Where to find more  information: °· American Association of Kidney Patients: www.aakp.org °· National Kidney Foundation: www.kidney.org °· American Kidney Fund: www.akfinc.org °· [employer]: www.lifeoptions.org and www.kidneyschool.org °Contact a health care provider if: °· Your symptoms get worse. °· You develop new symptoms. °Get help right away if: °· You develop symptoms of end-stage kidney disease, which include: °¨ Headaches. °¨ Abnormally dark or light skin. °¨ Numbness in the hands or feet. °¨ Easy bruising. °¨ Frequent hiccups. °¨ Chest pain. °¨ Shortness of breath. °¨ End of menstruation in women. °· You have a fever. °· You have decreased urine production. °· You have pain or bleeding when you urinate. °This information is not intended to replace advice given to you by your health care provider. Make sure you discuss any questions you have with your health care provider. °Document Released: 08/15/2010 Document Revised: 09/09/2015 Document Reviewed: 09/29/2011 °Elsevier Interactive Patient Education © 2017 Elsevier Inc. ° ° °

## 2016-02-27 NOTE — Progress Notes (Signed)
[name]. to be D/C'd to home per MD order.  Discussed with the patient and all questions fully answered.  VSS, Skin clean, dry and intact without evidence of skin break down, no evidence of skin tears noted. IV catheter discontinued intact. Site without signs and symptoms of complications. Dressing and pressure applied.  An After Visit Summary was printed and given to the patient. Patient received prescription.  D/c education completed with patient/family including follow up instructions, medication list, d/c activities limitations if indicated, with other d/c instructions as indicated by MD - patient able to verbalize understanding, all questions fully answered.   Patient instructed to return to ED, call 911, or call MD for any changes in condition.   Patient escorted via [place], and D/C home via private auto.  Morley Kos Price 02/27/2016 2:55 PM

## 2016-02-27 NOTE — Progress Notes (Signed)
Patient arrived to unit via stretcher with girlfriend. Patients moved to bed with side rales up x2.  Patient not alert or oriented, girlfriend given unit orientation information with call light and phone within reach.  Patient ID armband information verified with his girlfriend.  Patient became agitated and started thrashing in bed, his girlfriend was able to calm him down and patient resting in bed.  Will continue to monitor patient and notify MD as needed.

## 2016-03-02 LAB — CULTURE, BLOOD (ROUTINE X 2)
Culture: NO GROWTH
Culture: NO GROWTH

## 2016-03-16 DIAGNOSIS — F17200 Nicotine dependence, unspecified, uncomplicated: Secondary | ICD-10-CM | POA: Diagnosis not present

## 2016-03-16 DIAGNOSIS — D57219 Sickle-cell/Hb-C disease with crisis, unspecified: Secondary | ICD-10-CM | POA: Diagnosis not present

## 2016-04-13 DIAGNOSIS — Z79891 Long term (current) use of opiate analgesic: Secondary | ICD-10-CM | POA: Diagnosis not present

## 2016-04-26 IMAGING — CR DG CHEST 1V PORT
1 series · 1 of 1 positions shown · non-contrast
Comparison: 08/17/2014

CLINICAL DATA: Chest pain and low back pain, onset this afternoon.
Dyspnea.

EXAM:
PORTABLE CHEST 1 VIEW

[AP]
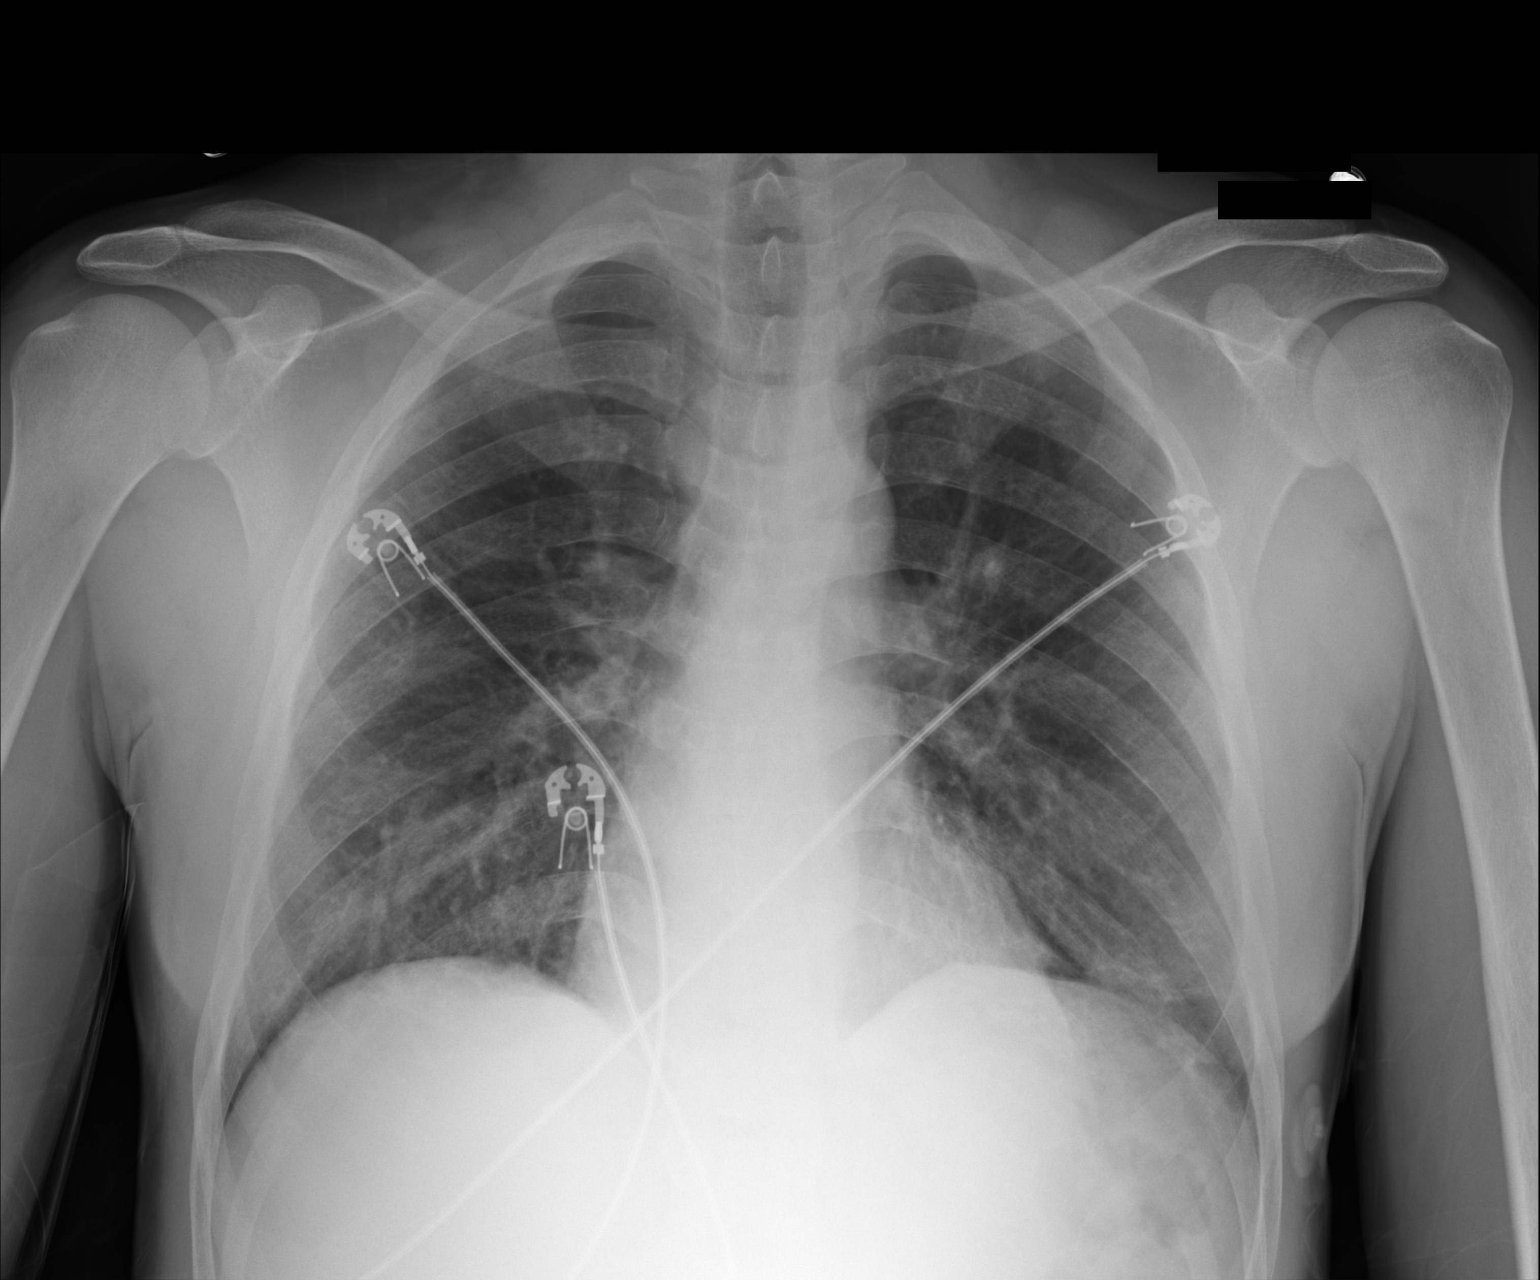

[1 of 1 positions shown; findings below may reference images not displayed]

FINDINGS: There is mild patchy opacity in the left base which could represent
atelectasis or early infectious infiltrate. No large effusions are
evident. Right lung is clear. Pulmonary vasculature is normal.
IMPRESSION: Patchy left base opacity, atelectasis versus early infectious
infiltrate.

## 2016-05-15 DIAGNOSIS — Z79891 Long term (current) use of opiate analgesic: Secondary | ICD-10-CM | POA: Diagnosis not present

## 2016-06-14 DIAGNOSIS — Z79891 Long term (current) use of opiate analgesic: Secondary | ICD-10-CM | POA: Diagnosis not present

## 2016-06-20 DIAGNOSIS — Z79891 Long term (current) use of opiate analgesic: Secondary | ICD-10-CM | POA: Diagnosis not present

## 2016-07-27 DIAGNOSIS — Z79891 Long term (current) use of opiate analgesic: Secondary | ICD-10-CM | POA: Diagnosis not present

## 2016-08-01 ENCOUNTER — Inpatient Hospital Stay (HOSPITAL_COMMUNITY)
Admission: EM | Admit: 2016-08-01 | Discharge: 2016-08-04 | DRG: 812 | Disposition: A | Discharge status: Home / Self Care | Payer: Medicare Other | Attending: Internal Medicine | Admitting: Internal Medicine

## 2016-08-01 ENCOUNTER — Encounter (HOSPITAL_COMMUNITY): Payer: Self-pay

## 2016-08-01 DIAGNOSIS — D571 Sickle-cell disease without crisis: Secondary | ICD-10-CM | POA: Diagnosis present

## 2016-08-01 DIAGNOSIS — D5700 Hb-SS disease with crisis, unspecified: Principal | ICD-10-CM | POA: Diagnosis present

## 2016-08-01 DIAGNOSIS — R000 Tachycardia, unspecified: Secondary | ICD-10-CM | POA: Diagnosis not present

## 2016-08-01 DIAGNOSIS — N183 Chronic kidney disease, stage 3 (moderate): Secondary | ICD-10-CM | POA: Diagnosis present

## 2016-08-01 DIAGNOSIS — D72829 Elevated white blood cell count, unspecified: Secondary | ICD-10-CM | POA: Diagnosis present

## 2016-08-01 DIAGNOSIS — D649 Anemia, unspecified: Secondary | ICD-10-CM | POA: Diagnosis present

## 2016-08-01 DIAGNOSIS — E869 Volume depletion, unspecified: Secondary | ICD-10-CM | POA: Diagnosis present

## 2016-08-01 DIAGNOSIS — Z79899 Other long term (current) drug therapy: Secondary | ICD-10-CM

## 2016-08-01 DIAGNOSIS — D638 Anemia in other chronic diseases classified elsewhere: Secondary | ICD-10-CM | POA: Diagnosis present

## 2016-08-01 DIAGNOSIS — N189 Chronic kidney disease, unspecified: Secondary | ICD-10-CM

## 2016-08-01 DIAGNOSIS — E872 Acidosis, unspecified: Secondary | ICD-10-CM | POA: Diagnosis present

## 2016-08-01 DIAGNOSIS — N08 Glomerular disorders in diseases classified elsewhere: Secondary | ICD-10-CM

## 2016-08-01 DIAGNOSIS — K219 Gastro-esophageal reflux disease without esophagitis: Secondary | ICD-10-CM | POA: Diagnosis not present

## 2016-08-01 DIAGNOSIS — D7589 Other specified diseases of blood and blood-forming organs: Secondary | ICD-10-CM | POA: Diagnosis present

## 2016-08-01 DIAGNOSIS — N184 Chronic kidney disease, stage 4 (severe): Secondary | ICD-10-CM | POA: Diagnosis present

## 2016-08-01 DIAGNOSIS — F17210 Nicotine dependence, cigarettes, uncomplicated: Secondary | ICD-10-CM | POA: Diagnosis present

## 2016-08-01 DIAGNOSIS — G894 Chronic pain syndrome: Secondary | ICD-10-CM | POA: Diagnosis present

## 2016-08-01 HISTORY — DX: Sickle-cell disease without crisis: D57.1

## 2016-08-01 HISTORY — DX: Hypertrophy of breast: N62

## 2016-08-01 HISTORY — DX: Glomerular disorders in diseases classified elsewhere: N08

## 2016-08-01 HISTORY — DX: Tachycardia, unspecified: R00.0

## 2016-08-01 LAB — RETICULOCYTES
RBC.: 1.68 MIL/uL — AB (ref 4.22–5.81)
RETIC CT PCT: 6.7 % — AB (ref 0.4–3.1)
Retic Count, Absolute: 112.6 10*3/uL (ref 19.0–186.0)

## 2016-08-01 LAB — COMPREHENSIVE METABOLIC PANEL
ALBUMIN: 4.4 g/dL (ref 3.5–5.0)
ALK PHOS: 169 U/L — AB (ref 38–126)
ALT: 48 U/L (ref 17–63)
ANION GAP: 14 (ref 5–15)
AST: 74 U/L — ABNORMAL HIGH (ref 15–41)
BILIRUBIN TOTAL: 3.5 mg/dL — AB (ref 0.3–1.2)
BUN: 27 mg/dL — ABNORMAL HIGH (ref 6–20)
CO2: 9 mmol/L — ABNORMAL LOW (ref 22–32)
Calcium: 8.9 mg/dL (ref 8.9–10.3)
Chloride: 111 mmol/L (ref 101–111)
Creatinine, Ser: 1.79 mg/dL — ABNORMAL HIGH (ref 0.61–1.24)
GFR calc Af Amer: 56 mL/min — ABNORMAL LOW (ref >60)
GFR, EST NON AFRICAN AMERICAN: 48 mL/min — AB (ref >60)
GLUCOSE: 136 mg/dL — AB (ref 65–99)
POTASSIUM: 4.4 mmol/L (ref 3.5–5.1)
Sodium: 134 mmol/L — ABNORMAL LOW (ref 135–145)
TOTAL PROTEIN: 8 g/dL (ref 6.5–8.1)

## 2016-08-01 LAB — CBC WITH DIFFERENTIAL/PLATELET
BASOS ABS: 0 10*3/uL (ref 0.0–0.1)
Basophils Relative: 0 %
Eosinophils Absolute: 0.1 10*3/uL (ref 0.0–0.7)
Eosinophils Relative: 1 %
HCT: 21.4 % — ABNORMAL LOW (ref 39.0–52.0)
HEMOGLOBIN: 7.8 g/dL — AB (ref 13.0–17.0)
LYMPHS ABS: 4.4 10*3/uL — AB (ref 0.7–4.0)
LYMPHS PCT: 33 %
MCH: 46.4 pg — ABNORMAL HIGH (ref 26.0–34.0)
MCHC: 36.4 g/dL — AB (ref 30.0–36.0)
MCV: 127.4 fL — ABNORMAL HIGH (ref 78.0–100.0)
MONOS PCT: 7 %
Monocytes Absolute: 0.9 10*3/uL (ref 0.1–1.0)
NEUTROS ABS: 7.8 10*3/uL — AB (ref 1.7–7.7)
Neutrophils Relative %: 59 %
Platelets: 162 10*3/uL (ref 150–400)
RBC: 1.68 MIL/uL — AB (ref 4.22–5.81)
RDW: 17.1 % — AB (ref 11.5–15.5)
WBC: 13.2 10*3/uL — AB (ref 4.0–10.5)

## 2016-08-01 LAB — LIPASE, BLOOD: Lipase: 42 U/L (ref 11–51)

## 2016-08-01 MED ORDER — HYDROMORPHONE HCL 1 MG/ML IJ SOLN
2.0000 mg | INTRAMUSCULAR | Status: AC
  Administered 2016-08-01: 2 mg via INTRAVENOUS
  Filled 2016-08-01: qty 2

## 2016-08-01 MED ORDER — HYDROMORPHONE HCL 1 MG/ML IJ SOLN: 2.0000 mg | INTRAMUSCULAR | Status: AC

## 2016-08-01 MED ORDER — HYDROMORPHONE HCL 1 MG/ML IJ SOLN
2.0000 mg | INTRAMUSCULAR | Status: AC
  Filled 2016-08-01: qty 2

## 2016-08-01 MED ORDER — HYDROMORPHONE HCL 1 MG/ML IJ SOLN
0.5000 mg | Freq: Once | INTRAMUSCULAR | Status: AC
  Administered 2016-08-01: 0.5 mg via SUBCUTANEOUS
  Filled 2016-08-01: qty 1

## 2016-08-01 MED ORDER — ONDANSETRON HCL 4 MG/2ML IJ SOLN: 4.0000 mg | INTRAMUSCULAR | Status: DC | PRN

## 2016-08-01 MED ORDER — SODIUM CHLORIDE 0.45 % IV SOLN
INTRAVENOUS | Status: DC
  Administered 2016-08-01: 21:00:00 via INTRAVENOUS

## 2016-08-01 MED ORDER — ONDANSETRON 4 MG PO TBDP
ORAL_TABLET | ORAL | Status: AC
  Filled 2016-08-01: qty 1

## 2016-08-01 MED ORDER — HYDROMORPHONE HCL 1 MG/ML IJ SOLN
2.0000 mg | INTRAMUSCULAR | Status: AC
  Administered 2016-08-01: 2 mg via INTRAVENOUS

## 2016-08-01 MED ORDER — DIPHENHYDRAMINE HCL 25 MG PO CAPS: 25.0000 mg | ORAL_CAPSULE | ORAL | Status: DC | PRN

## 2016-08-01 MED ORDER — ONDANSETRON 4 MG PO TBDP
4.0000 mg | ORAL_TABLET | Freq: Once | ORAL | Status: AC
  Administered 2016-08-01: 4 mg via ORAL

## 2016-08-01 NOTE — ED Notes (Signed)
IV team at bedside 

## 2016-08-01 NOTE — ED Provider Notes (Signed)
[place] DEPT Provider Note   CSN: [id number] Arrival date & time: 08/01/16  1808     History   Chief Complaint Chief Complaint  Patient presents with  . Sickle Cell Pain Crisis    HPI [name] [name]. is a 36 y.o. male.  [name] A Singleterry Jr. Is a 36 y.o. Male with history of sickle cell anemia who presents to the emergency department complaining of a sickle cell pain crisis starting today. Patient reports he began having pain today in his bilateral low back, his bilateral legs and his bilateral arms. He reports this feels like his typical sickle cell pain crisis. He reports this began today around 1 PM. He also reports some nausea and vomiting and no abdominal pain. He reports taking oxycodone today without relief of his symptoms. He denies any chest pain or shortness of breath. He is followed by sickle cell in [facility]. He has a history of renal disease. He denies fevers, chest pain, shortness of breath, abdominal pain, diarrhea, urinary symptoms, or rashes.    The history is provided by the patient and medical records. No language interpreter was used.  Sickle Cell Pain Crisis  Associated symptoms: nausea and vomiting   Associated symptoms: no chest pain, no congestion, no cough, no fever, no headaches, no shortness of breath, no sore throat and no wheezing     Past Medical History:  Diagnosis Date  . Bacterial pneumonia ~ 2012   "caught it here in the hospital" (09/25/2012)  . Chronic kidney disease    "from my sickle cell" (09/25/2012)  . Cyst of brain    "2 really small ones in the back of my head; inside; saw them w/MRI" (09/25/2012)  . GERD (gastroesophageal reflux disease)    "after I eat alot of spicey foods" (09/25/2012)  . History of blood transfusion    "always related to sickle cell crisis" (09/25/2012)  . Migraines    "take RX qd to prevent them" (09/25/2012)  . Sickle cell anemia (HCC)   . Sickle cell crisis ([place]) 09/25/2012    Patient Active  Problem List   Diagnosis Date Noted  . Increased anion gap metabolic acidosis   . Acute renal failure superimposed on stage 3 chronic kidney disease ([place])   . 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 Sickle cell anemia with crisis ([place]) 03/11/2015  . Cocaine use   . Sickle cell pain crisis ([place]) 11/21/2014  . Hb-SS disease with acute chest syndrome ([place])   . Hb-SS disease with crisis ([place])   . Sickle cell anemia ([place]) 08/18/2014  . Nausea with vomiting   . Polysubstance abuse 03/22/2014  . Chest pain   . Sickle cell crisis ([place]) 03/19/2014  . Vaso-occlusive sickle cell crisis ([place]) 05/09/2013  . Cocaine abuse 09/26/2012  . Acute-on-chronic kidney injury ([place]) 09/26/2012  . Systolic murmur 63/78/5885  . Migraine 11/26/2009  . CHRONIC KIDNEY DISEASE STAGE II (M[place]) 03/06/2009  . Sickle cell disease, type SS. No outpatient prescriptions from [facility] due to cocaine positive, opiate negative on high doses of given narcotics 06/18/2008  . TOBACCO ABUSE 05/22/2007    Past Surgical History:  Procedure Laterality Date  . CHOLECYSTECTOMY  ~ 2012       Home Medications    Prior to Admission medications   Medication Sig Start Date End Date Taking? Authorizing Provider  hydroxyurea (HYDREA) 500 MG capsule TAKE 3 CAPSULES (1,500 MG TOTAL) BY MOUTH DAILY. 12/14/15  Yes  Donnamae Jude, MD  nortriptyline (PAMELOR) 25 MG capsule TAKE 1 CAPSULE (25 MG TOTAL) BY MOUTH AT BEDTIME. 12/28/15  Yes Donnamae Jude, MD  oxyCODONE-acetaminophen (PERCOCET/ROXICET) 5-325 MG tablet Take 1 tablet by mouth every 6 (six) hours as needed for severe pain. 12/03/15  Yes Lawyer, Harrell Gave, PA-C  sodium bicarbonate 650 MG tablet Take 1 tablet (650 mg total) by mouth 2 (two) times daily. 02/27/16  Yes Dhungel, Flonnie Overman, MD    Family History Family History  Problem Relation Age of Onset  . Breast cancer Mother     Social History Social History    Substance Use Topics  . Smoking status: Current Some Day Smoker    Types: Cigarettes  . Smokeless tobacco: Never Used     Comment: 09/25/2012 "I don't buy cigarettes; bum one from friends q now and then"  . Alcohol use 0.0 oz/week     Comment: occasionally     Allergies   Morphine and related   Review of Systems Review of Systems  Constitutional: Negative for chills and fever.  HENT: Negative for congestion and sore throat.   Eyes: Negative for visual disturbance.  Respiratory: Negative for cough, shortness of breath and wheezing.   Cardiovascular: Negative for chest pain.  Gastrointestinal: Positive for nausea and vomiting. Negative for abdominal pain, blood in stool and diarrhea.  Genitourinary: Negative for difficulty urinating, dysuria, flank pain and frequency.  Musculoskeletal: Positive for arthralgias and back pain. Negative for neck pain.  Skin: Negative for rash.  Neurological: Negative for syncope, light-headedness and headaches.     Physical Exam Updated Vital Signs BP 112/66   Pulse (!) 114   Temp 97.5 F (36.4 C) (Oral)   Resp 16   Ht 5\' 3"  (1.6 m)   Wt 61.2 kg (135 lb)   SpO2 90%   BMI 23.91 kg/m   Physical Exam  Constitutional: He is oriented to person, place, and time. He appears well-developed and well-nourished. No distress.  Nontoxic appearing.  HENT:  Head: Normocephalic and atraumatic.  Mouth/Throat: Oropharynx is clear and moist.  Eyes: Conjunctivae are normal. Pupils are equal, round, and reactive to light. Right eye exhibits no discharge. Left eye exhibits no discharge.  Neck: Neck supple. No JVD present.  Cardiovascular: Regular rhythm, normal heart sounds and intact distal pulses.  Exam reveals no gallop and no friction rub.   No murmur heard. Heart rate is 108 on exam. Bilateral radial, posterior tibialis and dorsalis pedis pulses are intact.    Pulmonary/Chest: Effort normal and breath sounds normal. No stridor. No respiratory  distress. He has no wheezes. He has no rales.  Lungs are clear to ascultation bilaterally. Symmetric chest expansion bilaterally. No increased work of breathing. No rales or rhonchi.    Abdominal: Soft. Bowel sounds are normal. He exhibits no distension and no mass. There is no tenderness. There is no guarding.  Abdomen is soft and nontender to palpation.  Musculoskeletal: Normal range of motion. He exhibits no edema or deformity.  Patient is spontaneously moving all extremities in a coordinated fashion exhibiting good strength.  Patient's bilateral shoulder, elbow, wrist, hip, knee and ankle joints are supple and without erythema or edema.  Lymphadenopathy:    He has no cervical adenopathy.  Neurological: He is alert and oriented to person, place, and time. He exhibits normal muscle tone. Coordination normal.  Skin: Skin is warm and dry. Capillary refill takes less than 2 seconds. No rash noted. He is not diaphoretic. No erythema. No  pallor.  Psychiatric: He has a normal mood and affect. His behavior is normal.  Nursing note and vitals reviewed.    ED Treatments / Results  Labs (all labs ordered are listed, but only abnormal results are displayed) Labs Reviewed  COMPREHENSIVE METABOLIC PANEL - Abnormal; Notable for the following:       Result Value   Sodium 134 (*)    CO2 9 (*)    Glucose, Bld 136 (*)    BUN 27 (*)    Creatinine, Ser 1.79 (*)    AST 74 (*)    Alkaline Phosphatase 169 (*)    Total Bilirubin 3.5 (*)    GFR calc non Af Amer 48 (*)    GFR calc Af Amer 56 (*)    All other components within normal limits  CBC WITH DIFFERENTIAL/PLATELET - Abnormal; Notable for the following:    WBC 13.2 (*)    RBC 1.68 (*)    Hemoglobin 7.8 (*)    HCT 21.4 (*)    MCV 127.4 (*)    MCH 46.4 (*)    MCHC 36.4 (*)    RDW 17.1 (*)    Neutro Abs 7.8 (*)    Lymphs Abs 4.4 (*)    All other components within normal limits  RETICULOCYTES - Abnormal; Notable for the following:    Retic Ct  Pct 6.7 (*)    RBC. 1.68 (*)    All other components within normal limits  LIPASE, BLOOD  RAPID URINE DRUG SCREEN, HOSP PERFORMED  URINALYSIS, ROUTINE W REFLEX MICROSCOPIC    EKG  EKG Interpretation None       Radiology No results found.  Procedures Procedures (including critical care time)  Medications Ordered in ED Medications  0.45 % sodium chloride infusion ( Intravenous New Bag/Given 08/01/16 2128)  diphenhydrAMINE (BENADRYL) capsule 25-50 mg (not administered)  ondansetron (ZOFRAN) injection 4 mg (not administered)  ondansetron (ZOFRAN-ODT) disintegrating tablet 4 mg (4 mg Oral Given 08/01/16 1822)  HYDROmorphone (DILAUDID) injection 0.5 mg (0.5 mg Subcutaneous Given 08/01/16 1826)  HYDROmorphone (DILAUDID) injection 2 mg (2 mg Intravenous Given 08/01/16 2129)    Or  HYDROmorphone (DILAUDID) injection 2 mg ( Subcutaneous See Alternative 08/01/16 2129)  HYDROmorphone (DILAUDID) injection 2 mg (2 mg Intravenous Given 08/01/16 2158)    Or  HYDROmorphone (DILAUDID) injection 2 mg ( Subcutaneous See Alternative 08/01/16 2158)  HYDROmorphone (DILAUDID) injection 2 mg (2 mg Intravenous Given 08/01/16 2228)    Or  HYDROmorphone (DILAUDID) injection 2 mg ( Subcutaneous See Alternative 08/01/16 2228)     Initial Impression / Assessment and Plan / ED Course  I have reviewed the triage vital signs and the nursing notes.  Pertinent labs & imaging results that were available during my care of the patient were reviewed by me and considered in my medical decision making (see chart for details).    This is a 36 y.o. Male with history of sickle cell anemia who presents to the emergency department complaining of a sickle cell pain crisis starting today. Patient reports he began having pain today in his bilateral low back, his bilateral legs and his bilateral arms. He reports this feels like his typical sickle cell pain crisis. He reports this began today around 1 PM. He also reports some  nausea and vomiting and no abdominal pain. He reports taking oxycodone today without relief of his symptoms. He denies any chest pain or shortness of breath. He is followed by sickle cell in [facility]. On  exam patient is afebrile nontoxic appearing. His heart rate is mildly elevated at 108 on my exam. Lungs are clear to auscultation bilaterally. Abdomen is soft and nontender to palpation. Joints are supple and without erythema or edema. Lipase is within normal limits. His CMP shows a creatinine that is improved from his baseline at 1.79. Patient with a history of renal disease. Liver enzymes are around his baseline. CBC shows a mildly dissected with a white count of 13,000. Hemoglobin is 7.8 which is an improvement from his most recent blood work.  Sickle cell pain orders initiated. At reevaluation after 4 rounds of pain medication patient reports his symptoms have still not improved. He would like to be admitted for his pain. Will plan for admission. I consulted with [facility] hospitalist Dr. Tamala Julian who said the patient for admission. We'll plan for transfer to [facility] long hospital with accepting physician Dr. Loleta Books for sickle cell team care at that facility.     Final Clinical Impressions(s) / ED Diagnoses   Final diagnoses:  Sickle cell pain crisis [facility])    New Prescriptions New Prescriptions   No medications on file     Waynetta Pean, Hershal Coria 08/02/16 0118    LongWonda Olds, MD 08/02/16 1406

## 2016-08-01 NOTE — ED Triage Notes (Signed)
Per Pt, Pt is coming from home with complaints of Sickle Cell Crisis that started about an hour ago. Reports N/V and no relief from pain medicine. Pt complains of bilateral leg pain, arm pain, and lower back pain.

## 2016-08-01 NOTE — ED Notes (Signed)
Pt sleeping snoring  Did not disturb.  Male lying in the floor beside him

## 2016-08-02 ENCOUNTER — Encounter (HOSPITAL_COMMUNITY): Payer: Self-pay | Admitting: Internal Medicine

## 2016-08-02 DIAGNOSIS — D7589 Other specified diseases of blood and blood-forming organs: Secondary | ICD-10-CM | POA: Diagnosis not present

## 2016-08-02 DIAGNOSIS — D649 Anemia, unspecified: Secondary | ICD-10-CM | POA: Diagnosis present

## 2016-08-02 DIAGNOSIS — D5700 Hb-SS disease with crisis, unspecified: Secondary | ICD-10-CM | POA: Diagnosis not present

## 2016-08-02 DIAGNOSIS — R000 Tachycardia, unspecified: Secondary | ICD-10-CM | POA: Diagnosis not present

## 2016-08-02 DIAGNOSIS — D72829 Elevated white blood cell count, unspecified: Secondary | ICD-10-CM | POA: Diagnosis not present

## 2016-08-02 DIAGNOSIS — G894 Chronic pain syndrome: Secondary | ICD-10-CM | POA: Diagnosis not present

## 2016-08-02 DIAGNOSIS — E869 Volume depletion, unspecified: Secondary | ICD-10-CM | POA: Diagnosis not present

## 2016-08-02 DIAGNOSIS — F1910 Other psychoactive substance abuse, uncomplicated: Secondary | ICD-10-CM | POA: Diagnosis not present

## 2016-08-02 DIAGNOSIS — Z79899 Other long term (current) drug therapy: Secondary | ICD-10-CM | POA: Diagnosis not present

## 2016-08-02 DIAGNOSIS — F17210 Nicotine dependence, cigarettes, uncomplicated: Secondary | ICD-10-CM | POA: Diagnosis not present

## 2016-08-02 DIAGNOSIS — D638 Anemia in other chronic diseases classified elsewhere: Secondary | ICD-10-CM | POA: Diagnosis present

## 2016-08-02 DIAGNOSIS — E872 Acidosis: Secondary | ICD-10-CM | POA: Diagnosis not present

## 2016-08-02 DIAGNOSIS — N183 Chronic kidney disease, stage 3 (moderate): Secondary | ICD-10-CM | POA: Diagnosis not present

## 2016-08-02 DIAGNOSIS — N182 Chronic kidney disease, stage 2 (mild): Secondary | ICD-10-CM | POA: Diagnosis not present

## 2016-08-02 DIAGNOSIS — K219 Gastro-esophageal reflux disease without esophagitis: Secondary | ICD-10-CM | POA: Diagnosis not present

## 2016-08-02 LAB — RETICULOCYTES
RBC.: 1.5 MIL/uL — AB (ref 4.22–5.81)
RETIC COUNT ABSOLUTE: 150 10*3/uL (ref 19.0–186.0)
RETIC CT PCT: 10 % — AB (ref 0.4–3.1)

## 2016-08-02 LAB — URINALYSIS, ROUTINE W REFLEX MICROSCOPIC
Bilirubin Urine: NEGATIVE
GLUCOSE, UA: NEGATIVE mg/dL
HGB URINE DIPSTICK: NEGATIVE
Ketones, ur: NEGATIVE mg/dL
Leukocytes, UA: NEGATIVE
NITRITE: NEGATIVE
Protein, ur: NEGATIVE mg/dL
Specific Gravity, Urine: 1.014 (ref 1.005–1.030)
pH: 7 (ref 5.0–8.0)

## 2016-08-02 LAB — BASIC METABOLIC PANEL
ANION GAP: 10 (ref 5–15)
BUN: 26 mg/dL — AB (ref 6–20)
CHLORIDE: 109 mmol/L (ref 101–111)
CO2: 15 mmol/L — ABNORMAL LOW (ref 22–32)
Calcium: 9 mg/dL (ref 8.9–10.3)
Creatinine, Ser: 1.67 mg/dL — ABNORMAL HIGH (ref 0.61–1.24)
GFR calc Af Amer: >60 mL/min (ref >60)
GFR, EST NON AFRICAN AMERICAN: 52 mL/min — AB (ref >60)
Glucose, Bld: 109 mg/dL — ABNORMAL HIGH (ref 65–99)
POTASSIUM: 3.7 mmol/L (ref 3.5–5.1)
SODIUM: 134 mmol/L — AB (ref 135–145)

## 2016-08-02 LAB — CBC WITH DIFFERENTIAL/PLATELET
BASOS PCT: 0 %
Basophils Absolute: 0 10*3/uL (ref 0.0–0.1)
EOS ABS: 0.1 10*3/uL (ref 0.0–0.7)
EOS PCT: 1 %
HCT: 19.3 % — ABNORMAL LOW (ref 39.0–52.0)
HEMOGLOBIN: 7.4 g/dL — AB (ref 13.0–17.0)
Lymphocytes Relative: 47 %
Lymphs Abs: 6.2 10*3/uL — ABNORMAL HIGH (ref 0.7–4.0)
MCH: 49.3 pg — AB (ref 26.0–34.0)
MCHC: 38.3 g/dL — ABNORMAL HIGH (ref 30.0–36.0)
MCV: 128.7 fL — AB (ref 78.0–100.0)
Monocytes Absolute: 1.1 10*3/uL — ABNORMAL HIGH (ref 0.1–1.0)
Monocytes Relative: 8 %
NEUTROS PCT: 44 %
Neutro Abs: 5.9 10*3/uL (ref 1.7–7.7)
Platelets: 172 10*3/uL (ref 150–400)
RBC: 1.5 MIL/uL — AB (ref 4.22–5.81)
RDW: 18.4 % — ABNORMAL HIGH (ref 11.5–15.5)
WBC: 13.3 10*3/uL — AB (ref 4.0–10.5)

## 2016-08-02 LAB — RAPID URINE DRUG SCREEN, HOSP PERFORMED
AMPHETAMINES: NOT DETECTED
BARBITURATES: NOT DETECTED
Benzodiazepines: NOT DETECTED
Cocaine: NOT DETECTED
OPIATES: POSITIVE — AB
TETRAHYDROCANNABINOL: NOT DETECTED

## 2016-08-02 LAB — LACTATE DEHYDROGENASE: LDH: 259 U/L — ABNORMAL HIGH (ref 98–192)

## 2016-08-02 MED ORDER — DEXTROSE-NACL 5-0.45 % IV SOLN
INTRAVENOUS | Status: DC
  Administered 2016-08-03: 05:00:00 via INTRAVENOUS

## 2016-08-02 MED ORDER — SODIUM CHLORIDE 0.9 % IV SOLN
25.0000 mg | INTRAVENOUS | Status: DC | PRN
  Filled 2016-08-02: qty 0.5

## 2016-08-02 MED ORDER — DIPHENHYDRAMINE HCL 25 MG PO CAPS
25.0000 mg | ORAL_CAPSULE | ORAL | Status: DC | PRN
  Administered 2016-08-02: 50 mg via ORAL
  Administered 2016-08-02: 25 mg via ORAL
  Administered 2016-08-03: 50 mg via ORAL
  Filled 2016-08-02 (×3): qty 2

## 2016-08-02 MED ORDER — DIPHENHYDRAMINE HCL 50 MG/ML IJ SOLN: 25.0000 mg | INTRAMUSCULAR | Status: DC | PRN

## 2016-08-02 MED ORDER — NORTRIPTYLINE HCL 25 MG PO CAPS
25.0000 mg | ORAL_CAPSULE | Freq: Every day | ORAL | Status: DC
  Administered 2016-08-02 – 2016-08-03 (×2): 25 mg via ORAL
  Filled 2016-08-02 (×2): qty 1

## 2016-08-02 MED ORDER — NALOXONE HCL 0.4 MG/ML IJ SOLN: 0.4000 mg | INTRAMUSCULAR | Status: DC | PRN

## 2016-08-02 MED ORDER — DEXTROSE-NACL 5-0.45 % IV SOLN
INTRAVENOUS | Status: DC
  Administered 2016-08-02 (×2): via INTRAVENOUS

## 2016-08-02 MED ORDER — HYDROXYUREA 500 MG PO CAPS
1500.0000 mg | ORAL_CAPSULE | Freq: Every day | ORAL | Status: DC
  Administered 2016-08-02 – 2016-08-04 (×3): 1500 mg via ORAL
  Filled 2016-08-02 (×3): qty 3

## 2016-08-02 MED ORDER — HYDROMORPHONE 1 MG/ML IV SOLN
INTRAVENOUS | Status: DC
  Administered 2016-08-02: 06:00:00 via INTRAVENOUS
  Administered 2016-08-02: 2 mg via INTRAVENOUS

## 2016-08-02 MED ORDER — DIPHENHYDRAMINE HCL 25 MG PO CAPS: 25.0000 mg | ORAL_CAPSULE | ORAL | Status: DC | PRN

## 2016-08-02 MED ORDER — SODIUM CHLORIDE 0.9% FLUSH: 9.0000 mL | INTRAVENOUS | Status: DC | PRN

## 2016-08-02 MED ORDER — ONDANSETRON HCL 4 MG/2ML IJ SOLN: 4.0000 mg | Freq: Four times a day (QID) | INTRAMUSCULAR | Status: DC | PRN

## 2016-08-02 MED ORDER — SODIUM CHLORIDE 0.9 % IV BOLUS (SEPSIS)
1000.0000 mL | Freq: Once | INTRAVENOUS | Status: AC
  Administered 2016-08-02: 1000 mL via INTRAVENOUS

## 2016-08-02 MED ORDER — SENNOSIDES-DOCUSATE SODIUM 8.6-50 MG PO TABS
1.0000 | ORAL_TABLET | Freq: Two times a day (BID) | ORAL | Status: DC
  Administered 2016-08-02: 1 via ORAL
  Filled 2016-08-02 (×4): qty 1

## 2016-08-02 MED ORDER — DIPHENHYDRAMINE HCL 12.5 MG/5ML PO ELIX: 12.5000 mg | ORAL_SOLUTION | Freq: Four times a day (QID) | ORAL | Status: DC | PRN

## 2016-08-02 MED ORDER — OXYCODONE HCL 5 MG PO TABS
10.0000 mg | ORAL_TABLET | Freq: Four times a day (QID) | ORAL | Status: DC
  Administered 2016-08-02 – 2016-08-03 (×6): 10 mg via ORAL
  Filled 2016-08-02 (×5): qty 2

## 2016-08-02 MED ORDER — SALINE SPRAY 0.65 % NA SOLN
1.0000 | NASAL | Status: DC | PRN
  Filled 2016-08-02: qty 44

## 2016-08-02 MED ORDER — DIPHENHYDRAMINE HCL 50 MG/ML IJ SOLN
12.5000 mg | Freq: Four times a day (QID) | INTRAMUSCULAR | Status: DC | PRN
  Administered 2016-08-02: 12.5 mg via INTRAVENOUS
  Filled 2016-08-02: qty 1

## 2016-08-02 MED ORDER — SODIUM BICARBONATE 650 MG PO TABS
650.0000 mg | ORAL_TABLET | Freq: Two times a day (BID) | ORAL | Status: DC
  Administered 2016-08-02 – 2016-08-04 (×5): 650 mg via ORAL
  Filled 2016-08-02 (×5): qty 1

## 2016-08-02 MED ORDER — ENOXAPARIN SODIUM 40 MG/0.4ML ~~LOC~~ SOLN
40.0000 mg | SUBCUTANEOUS | Status: DC
  Administered 2016-08-02 – 2016-08-04 (×3): 40 mg via SUBCUTANEOUS
  Filled 2016-08-02 (×3): qty 0.4

## 2016-08-02 MED ORDER — HYDROMORPHONE 1 MG/ML IV SOLN
INTRAVENOUS | Status: DC
  Administered 2016-08-02: 1.5 mg via INTRAVENOUS
  Administered 2016-08-02: 4.5 mg via INTRAVENOUS
  Administered 2016-08-03: 3 mg via INTRAVENOUS
  Administered 2016-08-03: 2 mg via INTRAVENOUS
  Administered 2016-08-03: 1 mg via INTRAVENOUS
  Filled 2016-08-02: qty 25

## 2016-08-02 MED ORDER — POLYETHYLENE GLYCOL 3350 17 G PO PACK: 17.0000 g | PACK | Freq: Every day | ORAL | Status: DC | PRN

## 2016-08-02 NOTE — H&P (Signed)
History and Physical    Rod[place]us A [employer]. HGD:[id number] DOB: [birth year] DOA: 08/01/2016  Referring MD/NP/PA: Waynetta Pean, PA-C PCP: [name], MD  Patient coming from: Home  Chief Complaint: Pain in arms, legs, and lower back  HPI: [name] Geno. is a 36 y.o. male with medical history significant of sickle cell anemia, polysubstance abuse, and CKD stage III; who presents with complaints of complaints of pain in his arms, legs, and lower back since yesterday afternoon around 1 PM. Patient reports pain is similar to previous sickle cell pain crises. Patient tried using home oxycodone without relief of symptoms. Associated symptoms included some mild nausea and vomiting. Denies having any significant fever, chills, shortness of breath, chest pain, dysuria, abdominal pain, or worsening cough. Is followed by the sickle cell clinic in [place], [place]. Review of records shows the patient has a previous history of polysubstance abuse but currently denies.  ED Course: Upon admission to the emergency department patient seen to be afebrile with heart rates 106-116, and all other vitals within normal limits. Labs revealed WBC 13.2, hemoglobin 7.8, sodium 134, potassium 4.4, chloride 111, CO2 9, BUN 27, creatinine 1.79, glucose 133. UDS was ordered, but unable to be obtained yet. Patient received multiple rounds of hydromorphone without relief of symptoms. [facility] called to admit.  Review of Systems: As per HPI otherwise 10 point review of systems negative.   Past Medical History:  Diagnosis Date  . Bacterial pneumonia ~ 2012   "caught it here in the hospital" (09/25/2012)  . Chronic kidney disease    "from my sickle cell" (09/25/2012)  . Cyst of brain    "2 really small ones in the back of my head; inside; saw them w/MRI" (09/25/2012)  . GERD (gastroesophageal reflux disease)    "after I eat alot of spicey foods" (09/25/2012)  . History of blood transfusion    "always related to sickle  cell crisis" (09/25/2012)  . Migraines    "take RX qd to prevent them" (09/25/2012)  . Sickle cell anemia (HCC)   . Sickle cell crisis ([place]) 09/25/2012    Past Surgical History:  Procedure Laterality Date  . CHOLECYSTECTOMY  ~ 2012     reports that he has been smoking Cigarettes.  He has never used smokeless tobacco. He reports that he drinks alcohol. He reports that he uses drugs, including Marijuana.  Allergies  Allergen Reactions  . Morphine And Related Other (See Comments)    "real bad headaches"    Family History  Problem Relation Age of Onset  . Breast cancer Mother     Prior to Admission medications   Medication Sig Start Date End Date Taking? Authorizing Provider  hydroxyurea (HYDREA) 500 MG capsule TAKE 3 CAPSULES (1,500 MG TOTAL) BY MOUTH DAILY. 12/14/15  Yes Donnamae Jude, MD  nortriptyline (PAMELOR) 25 MG capsule TAKE 1 CAPSULE (25 MG TOTAL) BY MOUTH AT BEDTIME. 12/28/15  Yes Donnamae Jude, MD  oxyCODONE-acetaminophen (PERCOCET/ROXICET) 5-325 MG tablet Take 1 tablet by mouth every 6 (six) hours as needed for severe pain. 12/03/15  Yes Lawyer, Harrell Gave, PA-C  sodium bicarbonate 650 MG tablet Take 1 tablet (650 mg total) by mouth 2 (two) times daily. 02/27/16  Yes Dhungel, Nishant, MD    Physical Exam: Constitutional:Young male in NAD, calm, comfortable at this time Vitals:   08/01/16 2215 08/01/16 2230 08/01/16 2300 08/01/16 2315  BP: 121/66 130/74 124/68 112/66  Pulse: (!) 111 (!) 112 (!) 116 (!) 114  Resp: 17  15 14 16   Temp:      TempSrc:      SpO2: 95% 92% 91% 90%  Weight:      Height:       Eyes: PERRL, lids and conjunctivae normal ENMT: Mucous membranes are moist. Posterior pharynx clear of any exudate or lesions.Normal dentition.  Neck: normal, supple, no masses, no thyromegaly Respiratory: clear to auscultation bilaterally, no wheezing, no crackles. Normal respiratory effort. No accessory muscle use.  Cardiovascular:Tachycardic, no murmurs / rubs /  gallops. No extremity edema. 2+ pedal pulses. No carotid bruits.  Abdomen: no tenderness, no masses palpated. No hepatosplenomegaly. Bowel sounds positive.  Musculoskeletal: no clubbing / cyanosis. No joint deformity upper and lower extremities. Good ROM, no contractures. Normal muscle tone.  Skin: Patient actively scratching. no rashes, lesions, ulcers. No induration Neurologic: CN 2-12 grossly intact. Sensation intact, DTR normal. Strength 5/5 in all 4.  Psychiatric: Normal judgment and insight. Alert and oriented x 3. Normal mood.     Labs on Admission: I have personally reviewed following labs and imaging studies  CBC:  Recent Labs Lab 08/01/16 1840  WBC 13.2*  NEUTROABS 7.8*  HGB 7.8*  HCT 21.4*  MCV 127.4*  PLT 518   Basic Metabolic Panel:  Recent Labs Lab 08/01/16 1840  NA 134*  K 4.4  CL 111  CO2 9*  GLUCOSE 136*  BUN 27*  CREATININE 1.79*  CALCIUM 8.9   GFR: Estimated Creatinine Clearance: 47.2 mL/min (A) (by C-G formula based on SCr of 1.79 mg/dL (H)). Liver Function Tests:  Recent Labs Lab 08/01/16 1840  AST 74*  ALT 48  ALKPHOS 169*  BILITOT 3.5*  PROT 8.0  ALBUMIN 4.4    Recent Labs Lab 08/01/16 1849  LIPASE 42   No results for input(s): AMMONIA in the last 168 hours. Coagulation Profile: No results for input(s): INR, PROTIME in the last 168 hours. Cardiac Enzymes: No results for input(s): CKTOTAL, CKMB, CKMBINDEX, TROPONINI in the last 168 hours. BNP (last 3 results) No results for input(s): PROBNP in the last 8760 hours. HbA1C: No results for input(s): HGBA1C in the last 72 hours. CBG: No results for input(s): GLUCAP in the last 168 hours. Lipid Profile: No results for input(s): CHOL, HDL, LDLCALC, TRIG, CHOLHDL, LDLDIRECT in the last 72 hours. Thyroid Function Tests: No results for input(s): TSH, T4TOTAL, FREET4, T3FREE, THYROIDAB in the last 72 hours. Anemia Panel:  Recent Labs  08/01/16 1840  RETICCTPCT 6.7*   Urine  analysis:    Component Value Date/Time   COLORURINE YELLOW 11/21/2014 2003   APPEARANCEUR CLEAR 11/21/2014 2003   LABSPEC 1.010 11/21/2014 2003   PHURINE 6.0 11/21/2014 2003   GLUCOSEU 100 (A) 11/21/2014 2003   HGBUR NEGATIVE 11/21/2014 2003   HGBUR small 03/10/2010 [place] 11/21/2014 2003   [place] 11/21/2014 2003   PROTEINUR NEGATIVE 11/21/2014 2003   UROBILINOGEN 0.2 11/21/2014 2003   NITRITE NEGATIVE 11/21/2014 2003   LEUKOCYTESUR NEGATIVE 11/21/2014 2003   Sepsis Labs: No results found for this or any previous visit (from the past 240 hour(s)).   Radiological Exams on Admission: No results found.    Assessment/Plan Sickle cell pain crisisWith sickle cell anemia: Acute. Patient presents with typical sickle cell pain. - Admit to telemetry bed at [facility] long - Sickle cell pain crisis order set initiated - D5%-0.45% NS IVF at 125 ml/ hour - Full dose PCA pump per protocol - Continue hydroxyurea - Continue etodolac contraindicated secondary to kidney disease  Leukocytosis: WBC elevated at 13.2, but no reported infectious symptoms. - Repeat CBC in a.m.  Metabolic acidosis without anion gap - Continue sodium bicarbonate supplements  Chronic kidney disease stage III: Stable. - Continue to monitor   Polysubstance abuse history - F/u UDS DVT prophylaxis: Lovenox Code Status: full  Family Communication:  discuss plan of care with the patient failed present at bedside  Disposition Plan: Possible discharge home in 1-2 days once pain symptoms under control  Consults called: none Admission status: Observation  Norval Morton MD [facility] Pager [phone]  If 7PM-7AM, please contact night-coverage [email or web address] Password [facility]  08/02/2016, 12:49 AM

## 2016-08-02 NOTE — Progress Notes (Signed)
Talked to the Md. On call about the pt. Wanting to leave AMA and she said he is alert and oriented and we cannot keep him if he wants to go. The patient is upset about being on the monitor and having the sensor in his nose. Informed the pt. That it is our policy that he remain on the monitor while on the PCA he states he will try to stay through the night but he's leaving in the morning. He is refusing to put the telemetry on states he gets tangled in the wires I explained to him that if he put one of our gowns on that it has a pocket that will keep the wires out of his way. He refused stating " I'm not wearing that gown or the heart monitor".

## 2016-08-02 NOTE — Progress Notes (Addendum)
SICKLE CELL SERVICE PROGRESS NOTE  [name] [name]. GLO:[id number] DOB: [birth year] DOA: 08/01/2016 PCP: Billee Cashing, MD  Assessment/Plan: Principal Problem:   Sickle cell pain crisis (HCC) Active Problems:   CKD (chronic kidney disease), stage III   Polysubstance abuse   Leukocytosis  1. Hb SS with crisis: Continue PCA at current bolus dose but adjust the 1 hour limit to accommodate bolus dose. Also schedule Oxycodone 10 mg every 6 hours and add Toradol 15 mg every 6 hours. Monitor renal function closely.  2. Metabolic Acidosis: His baseline is unclear. Will repeat labs today and if he still is acidotic, will obtain ABG and likely ask Renal to see. This may also be related to [facility] which will require a discussion with patient about potions for ongoing care.   3. Volume Depletion: Will give a fluid bolus and continue infusion. 4. Macrocytosis: Secondary to Hydrea.  5. Tachycardia: May be related to volume status. Will re-assess after IVF bolus.  6. CKD Stage II: Pt has a baseline Cr of 1.7-1.8 from 2014. However his BUN is elevated beyond baseline which may reflect volume depletion. Will recheck labs today.  7. Anemia of Chronic Disease:Hb stable at baseline which per review of labs from [facility] is 6.5-8.5 g/dL.  8. Chronic Pain Syndrome: Pt takes Oxycodone 20 mg daily. However this short acting and will be substituted for by the PCA and Scheduled Oxycodone.  9. SCD: Pt reports that he takes his Hydrea 1500 mg daily. This is reflected in the elevation of his MCV to 127.4 which on review of labs is consistent with values going back to 2011.  10. Chronic Headache: Continue Nortriptyline 25 mg HS.   Code Status: Full Code Family Communication: N/A Disposition Plan: Not yet ready for discharge  [name] [name] A.  Pager [phone]. If 7PM-7AM, please contact night-coverage.  08/02/2016, 10:36 AM  LOS: 0 days   Interim History: Pt  Very upset about having to use monitoring device  on PCA but once explained the importance he became calm and cooperative and reported that his objection was that it is drying out his mucosa. Discussed a plan to mitigate this symptom and patient agreeable. Pt reports that he takes Oxycodone 20 mg on a daily basis to prevent pain from coming on and that usually prevents the pain from occurring. However on this occasion of crisis he increased the frequency of medication to every 4 hours and still had no relief thus he presented to the ED. Today he has pain in the arms, low back and legs. His pain was 10/10 in all locations on presentation last night but is now improved to 5/10 and 2/10 respectively in arms and back. However pain in legs remain at 10/10. He normally has a BM every day.   Spoke with his PMD Dr. Ronne Binning and he reports that patients medications include Vasotec for Sickle Cell Nephropathy, Hydrea 1000 mg and Oxycodone. However patient reports that he takes 1500 mg of Hydrea and [facility] confirms that the dose of Hydrea is 1500 mg and Vasotec is 5 mg. He is prescribed Nortriptyline 25 mg HS as a prophylaxis for headache.  Consultants:  None  Procedures:  None  Antibiotics:  None    Objective: Vitals:   08/02/16 0345 08/02/16 0428 08/02/16 0603 08/02/16 0816  BP: 115/76 (!) 118/54    Pulse: (!) 103 (!) 110    Resp: 12  16 16   Temp:  98.6 F (37 C)    TempSrc:  Oral  SpO2: 94% 93% 98% 98%  Weight:      Height:       Weight change:   Intake/Output Summary (Last 24 hours) at 08/02/16 1036 Last data filed at 08/02/16 0718  Gross per 24 hour  Intake             1725 ml  Output                1 ml  Net             1724 ml      Physical Exam General: Alert, awake, oriented x3, in no acute distress.  HEENT: [place]/AT PEERL, EOMI, mild Icterus Neck: Trachea midline,  no masses, no thyromegal,y no JVD, no carotid bruit OROPHARYNX:  Moist, No exudate/ erythema/lesions.  Heart: Regular rate and rhythm, II/VI SEM at  base without radiation. No rubsor gallops, PMI non-displaced, no heaves or thrills on palpation.  Lungs: Clear to auscultation, no wheezing or rhonchi noted. No increased vocal fremitus resonant to percussion  Abdomen: Soft, nontender, nondistended, positive bowel sounds, no masses no hepatosplenomegaly noted.  Neuro: No focal neurological deficits noted cranial nerves II through XII grossly intact. Strength at baseline in bilateral upper and lower extremities. Musculoskeletal: No warmth swelling or erythema around joints, no spinal tenderness noted. Psychiatric: Patient alert and oriented x3, good insight and cognition, good recent to remote recall.    Data Reviewed: Basic Metabolic Panel:  Recent Labs Lab 08/01/16 1840  NA 134*  K 4.4  CL 111  CO2 9*  GLUCOSE 136*  BUN 27*  CREATININE 1.79*  CALCIUM 8.9   Liver Function Tests:  Recent Labs Lab 08/01/16 1840  AST 74*  ALT 48  ALKPHOS 169*  BILITOT 3.5*  PROT 8.0  ALBUMIN 4.4    Recent Labs Lab 08/01/16 1849  LIPASE 42   No results for input(s): AMMONIA in the last 168 hours. CBC:  Recent Labs Lab 08/01/16 1840  WBC 13.2*  NEUTROABS 7.8*  HGB 7.8*  HCT 21.4*  MCV 127.4*  PLT 162   Cardiac Enzymes: No results for input(s): CKTOTAL, CKMB, CKMBINDEX, TROPONINI in the last 168 hours. BNP (last 3 results) No results for input(s): BNP in the last 8760 hours.  ProBNP (last 3 results) No results for input(s): PROBNP in the last 8760 hours.  CBG: No results for input(s): GLUCAP in the last 168 hours.  No results found for this or any previous visit (from the past 240 hour(s)).   Studies: No results found.  Scheduled Meds: . enoxaparin (LOVENOX) injection  40 mg Subcutaneous Q24H  . HYDROmorphone   Intravenous Q4H  . hydroxyurea  1,500 mg Oral Daily  . oxyCODONE  10 mg Oral Q6H  . senna-docusate  1 tablet Oral BID  . sodium bicarbonate  650 mg Oral BID   Continuous Infusions: . dextrose 5 % and  0.45% NaCl 125 mL/hr at 08/02/16 0548  . diphenhydrAMINE (BENADRYL) IVPB(SICKLE CELL ONLY)      Principal Problem:   Sickle cell pain crisis (HCC)   Metabolic Acidosis  Active Problems:   CKD (chronic kidney disease), stage III   Leukocytosis     In excess of 40 minutes spent during this visit. Greater than 50% involved face to face contact with the patient for assessment, counseling and coordination of care.   08/02/2016 2:57 pm Addendum:  Reviewed labs and bicarbonate improved. Recheck Therapist, nutritional. Continue IVF.   Shenell Rogalski A.

## 2016-08-02 NOTE — Progress Notes (Signed)
Page: [phone] [name] [name] is requesting to leave AMA. He does not want to keep his sensor in his nose nor his tele on.

## 2016-08-02 NOTE — Care Management Note (Signed)
Case Management Note  Patient Details  Name: [name] [name]. MRN: [id number] Date of Birth: Jun 02, 1980  Subjective/Objective:                  37 y.o. male with medical history significant of sickle cell anemia, polysubstance abuse, and CKD stage III; who presents with complaints of complaints of pain in his arms, legs, and lower back since yesterday afternoon around 1 PM. Patient reports pain is similar to previous sickle cell pain crises. Patient tried using home oxycodone without relief of symptoms. Associated symptoms included some mild nausea and vomiting. Denies having any significant fever, chills, shortness of breath, chest pain, dysuria, abdominal pain, or worsening cough. Is followed by the sickle cell clinic in [place], [place]. Review of records shows the patient has a previous history of polysubstance abuse but currently denies.  ED Course: Upon admission to the emergency department patient seen to be afebrile with heart rates 106-116, and all other vitals within normal limits. Labs revealed WBC 13.2, hemoglobin 7.8, sodium 134, potassium 4.4, chloride 111, CO2 9, BUN 27, creatinine 1.79, glucose 133. UDS was ordered, but unable to be obtained yet. Patient received multiple rounds of hydromorphone without relief of symptoms.   Action/Plan: Date:  August 02, 2016  Chart reviewed for concurrent status and case management needs.  Will continue to follow patient progress.  Discharge Planning: following for needs  Expected discharge date: [id number]  Velva Harman, BSN, [place], [place]   Expected Discharge Date:                  Expected Discharge Plan:     In-House Referral:     Discharge planning Services     Post Acute Care Choice:    Choice offered to:     DME Arranged:    DME Agency:     HH Arranged:    HH Agency:     Status of Service:     If discussed at [place] of Stay Meetings, dates discussed:    Additional Comments:  Leeroy Cha,  RN 08/02/2016, 9:16 AM

## 2016-08-02 NOTE — Progress Notes (Signed)
Patient refusing to wear pca cannula.  Patient told that he needs to wear, per policy.  Patient angrily demanding to speak to doctor before he wears.  Patient told that this is a policy, and that MD does not need to be paged.  Patient angrily stated, "This is your job, and you need to do it."  Dr. Zigmund Daniel made aware.  Patient to leave on cannula.

## 2016-08-02 NOTE — ED Notes (Addendum)
L arm from elbow to armpit swollen and tender to touch. No blood return noted.

## 2016-08-03 DIAGNOSIS — D7589 Other specified diseases of blood and blood-forming organs: Secondary | ICD-10-CM

## 2016-08-03 DIAGNOSIS — N182 Chronic kidney disease, stage 2 (mild): Secondary | ICD-10-CM

## 2016-08-03 DIAGNOSIS — E872 Acidosis: Secondary | ICD-10-CM

## 2016-08-03 DIAGNOSIS — D5700 Hb-SS disease with crisis, unspecified: Principal | ICD-10-CM

## 2016-08-03 DIAGNOSIS — D638 Anemia in other chronic diseases classified elsewhere: Secondary | ICD-10-CM

## 2016-08-03 LAB — BASIC METABOLIC PANEL
ANION GAP: 7 (ref 5–15)
BUN: 17 mg/dL (ref 6–20)
CALCIUM: 8.9 mg/dL (ref 8.9–10.3)
CO2: 18 mmol/L — ABNORMAL LOW (ref 22–32)
Chloride: 111 mmol/L (ref 101–111)
Creatinine, Ser: 1.27 mg/dL — ABNORMAL HIGH (ref 0.61–1.24)
GLUCOSE: 102 mg/dL — AB (ref 65–99)
POTASSIUM: 3.9 mmol/L (ref 3.5–5.1)
SODIUM: 136 mmol/L (ref 135–145)

## 2016-08-03 MED ORDER — HYDROMORPHONE HCL 1 MG/ML IJ SOLN: 1.0000 mg | INTRAMUSCULAR | Status: DC | PRN

## 2016-08-03 MED ORDER — OXYCODONE HCL 5 MG PO TABS
10.0000 mg | ORAL_TABLET | ORAL | Status: DC
  Administered 2016-08-03 – 2016-08-04 (×5): 10 mg via ORAL
  Filled 2016-08-03 (×6): qty 2

## 2016-08-03 NOTE — Progress Notes (Signed)
Patient refused to wear tele monitor, from 2 pm to 4 pm.

## 2016-08-03 NOTE — Progress Notes (Signed)
Dilaudid 7 ml PCA wasted with Bobbye Charleston RN.  Patient's 24 hour urine collection set up at 230pm.  [facility] set up with patient July 28th at 315 pm.  Information added to discharge follow up appointments.  Patient currently refusing to walk, and get ambulatory pulse ox.

## 2016-08-03 NOTE — Progress Notes (Signed)
SICKLE CELL SERVICE PROGRESS NOTE  [name] [name] [name]. ZOX:[id number] DOB: 12/09/1980 DOA: 08/01/2016 PCP: Billee Cashing, MD  Assessment/Plan: Principal Problem:   Sickle cell pain crisis (HCC) Active Problems:   CHRONIC KIDNEY DISEASE STAGE II (MILD)   Vaso-occlusive sickle cell crisis (HCC)   Sickle cell crisis (HCC)   Hb-SS disease with crisis (HCC)   Leukocytosis   Anemia of chronic disease   Macrocytosis due to Hydroxyurea   Sickle cell nephropathy (HCC)  1. Hb SS with crisis: Discontinue PCA and schedule Oxycodone 10 mg every 6 hours and continue Toradol 15 mg every 6 hours. Monitor renal function closely.  2. Metabolic Acidosis: CO2 has improved to 18 after hydration and with Sodium Bicarbonate supplementation. I suspect that this is partially caused by the Hydrea.  3. Volume Depletion: Resolved.  4. Macrocytosis: Secondary to Hydrea.  5. Tachycardia:resolved.  6. CKD Stage II: Pt has [name] baseline Cr of 1.7-1.8 from 2014. Will obtain urine Cr with 24 hour collection. 7. Anemia of Chronic Disease:Hb from yesterday was at baseline which per review of labs from [facility] is 6.5-8.5 g/dL. Will re-check labs tomorrow. 8. Chronic Pain Syndrome: Pt takes Oxycodone 20 mg daily. However this short acting and will be substituted for by the PCA and Scheduled Oxycodone.  9. SCD: Pt reports that he takes his Hydrea 1500 mg daily. This is reflected in the elevation of his MCV to 127.4 which on review of labs is consistent with values going back to 2011.  10. Chronic Headache: Continue Nortriptyline 25 mg HS.   Code Status: Full Code Family Communication: Significant other in the room Disposition Plan: Anticipate discharge home tomorrow.   [name] [name].  Pager [phone]. If 7PM-7AM, please contact night-coverage.  08/03/2016, 1:49 PM  LOS: 1 day   Interim History: Pt  Reports the huis pain is much improved today and at [name] 6/10 and localized just to the thighs. He has had minimal use  of the PCA in the last 12 hours. He is anxious to get home as he needs to manage some business related to [name] recent move but is agreeable to stay until tomorrow so that he can be adequately assessed for an appropriate discharge. I have also advised that patient should be seen in the out-patient setting by Nephrology and I have spoken with Dr. Hyman Hopes who agrees that an ambulatory evaluation is appropriate. [facility] will call patient to arrange the appointment.   Consultants:  None  Procedures:  None  Antibiotics:  None    Objective: Vitals:   08/03/16 0400 08/03/16 0415 08/03/16 0814 08/03/16 0945  BP:  103/65  (!) 103/46  Pulse:  90  92  Resp: 12 12 14 16   Temp:  98.5 F (36.9 C)  98.7 F (37.1 C)  TempSrc:  Oral  Oral  SpO2: 94% 98% 98% 100%  Weight:      Height:       Weight change:   Intake/Output Summary (Last 24 hours) at 08/03/16 1349 Last data filed at 08/03/16 0517  Gross per 24 hour  Intake          2960.83 ml  Output                0 ml  Net          2960.83 ml      Physical Exam General: Alert, awake, oriented x3, in no acute distress. Much more pleasant today. HEENT: [place]/AT PEERL, EOMI, mild Icterus  Heart: Regular  rate and rhythm, II/VI SEM at base without radiation. No rubsor gallops, PMI non-displaced, no heaves or thrills on palpation.  Lungs: Clear to auscultation, no wheezing or rhonchi noted. No increased vocal fremitus resonant to percussion  Abdomen: Soft, nontender, nondistended, positive bowel sounds, no masses no hepatosplenomegaly noted.  Neuro: No focal neurological deficits noted cranial nerves II through XII grossly intact. Strength at baseline in bilateral upper and lower extremities. Musculoskeletal: No warmth swelling or erythema around joints, no spinal tenderness noted. Psychiatric: Patient alert and oriented x3, good insight and cognition, good recent to remote recall.    Data Reviewed: Basic Metabolic Panel:  Recent  Labs Lab 08/01/16 1840 08/02/16 1222 08/03/16 0712  NA 134* 134* 136  K 4.4 3.7 3.9  CL 111 109 111  CO2 9* 15* 18*  GLUCOSE 136* 109* 102*  BUN 27* 26* 17  CREATININE 1.79* 1.67* 1.27*  CALCIUM 8.9 9.0 8.9   Liver Function Tests:  Recent Labs Lab 08/01/16 1840  AST 74*  ALT 48  ALKPHOS 169*  BILITOT 3.5*  PROT 8.0  ALBUMIN 4.4    Recent Labs Lab 08/01/16 1849  LIPASE 42   No results for input(s): AMMONIA in the last 168 hours. CBC:  Recent Labs Lab 08/01/16 1840 08/02/16 1222  WBC 13.2* 13.3*  NEUTROABS 7.8* 5.9  HGB 7.8* 7.4*  HCT 21.4* 19.3*  MCV 127.4* 128.7*  PLT 162 172   Cardiac Enzymes: No results for input(s): CKTOTAL, CKMB, CKMBINDEX, TROPONINI in the last 168 hours. BNP (last 3 results) No results for input(s): BNP in the last 8760 hours.  ProBNP (last 3 results) No results for input(s): PROBNP in the last 8760 hours.  CBG: No results for input(s): GLUCAP in the last 168 hours.  No results found for this or any previous visit (from the past 240 hour(s)).   Studies: No results found.  Scheduled Meds: . enoxaparin (LOVENOX) injection  40 mg Subcutaneous Q24H  . HYDROmorphone   Intravenous Q4H  . hydroxyurea  1,500 mg Oral Daily  . nortriptyline  25 mg Oral QHS  . oxyCODONE  10 mg Oral Q6H  . senna-docusate  1 tablet Oral BID  . sodium bicarbonate  650 mg Oral BID   Continuous Infusions: . dextrose 5 % and 0.45% NaCl 100 mL/hr at 08/03/16 0517  . diphenhydrAMINE (BENADRYL) IVPB(SICKLE CELL ONLY)      Principal Problem:   Sickle cell pain crisis (HCC)   Metabolic Acidosis  Active Problems:   CKD (chronic kidney disease), stage III   Leukocytosis     In excess of 30 minutes spent during this visit. Greater than 50% involved face to face contact with the patient for assessment, counseling and coordination of care.

## 2016-08-04 ENCOUNTER — Encounter (HOSPITAL_COMMUNITY): Payer: Self-pay | Admitting: Internal Medicine

## 2016-08-04 DIAGNOSIS — R000 Tachycardia, unspecified: Secondary | ICD-10-CM

## 2016-08-04 DIAGNOSIS — E872 Acidosis, unspecified: Secondary | ICD-10-CM | POA: Diagnosis present

## 2016-08-04 HISTORY — DX: Tachycardia, unspecified: R00.0

## 2016-08-04 LAB — HEMOGLOBINOPATHY EVALUATION
HGB A: 0 % — AB (ref 96.4–98.8)
HGB C: 0 %
HGB S QUANTITAION: 76.4 % — AB
HGB VARIANT: 0 %
Hgb A2 Quant: 4.1 % — ABNORMAL HIGH (ref 1.8–3.2)
Hgb F Quant: 19.5 % — ABNORMAL HIGH (ref 0.0–2.0)

## 2016-08-04 LAB — CBC WITH DIFFERENTIAL/PLATELET
BASOS ABS: 0 10*3/uL (ref 0.0–0.1)
Basophils Relative: 0 %
Eosinophils Absolute: 0.1 10*3/uL (ref 0.0–0.7)
Eosinophils Relative: 1 %
HEMATOCRIT: 18.3 % — AB (ref 39.0–52.0)
Hemoglobin: 6.8 g/dL — CL (ref 13.0–17.0)
LYMPHS ABS: 3.8 10*3/uL (ref 0.7–4.0)
Lymphocytes Relative: 44 %
MCH: 48.2 pg — ABNORMAL HIGH (ref 26.0–34.0)
MCHC: 37.2 g/dL — AB (ref 30.0–36.0)
MCV: 129.8 fL — AB (ref 78.0–100.0)
MONO ABS: 0.5 10*3/uL (ref 0.1–1.0)
MONOS PCT: 6 %
NEUTROS ABS: 4.2 10*3/uL (ref 1.7–7.7)
Neutrophils Relative %: 49 %
PLATELETS: 267 10*3/uL (ref 150–400)
RBC: 1.41 MIL/uL — AB (ref 4.22–5.81)
RDW: 18.1 % — AB (ref 11.5–15.5)
WBC: 8.6 10*3/uL (ref 4.0–10.5)

## 2016-08-04 LAB — BASIC METABOLIC PANEL
Anion gap: 6 (ref 5–15)
BUN: 12 mg/dL (ref 6–20)
CO2: 20 mmol/L — ABNORMAL LOW (ref 22–32)
Calcium: 8.8 mg/dL — ABNORMAL LOW (ref 8.9–10.3)
Chloride: 109 mmol/L (ref 101–111)
Creatinine, Ser: 1.16 mg/dL (ref 0.61–1.24)
GFR calc Af Amer: >60 mL/min (ref >60)
GLUCOSE: 95 mg/dL (ref 65–99)
POTASSIUM: 3.3 mmol/L — AB (ref 3.5–5.1)
SODIUM: 135 mmol/L (ref 135–145)

## 2016-08-04 LAB — RETICULOCYTES
RBC.: 1.41 MIL/uL — AB (ref 4.22–5.81)
RETIC COUNT ABSOLUTE: 157.9 10*3/uL (ref 19.0–186.0)
Retic Ct Pct: 11.2 % — ABNORMAL HIGH (ref 0.4–3.1)

## 2016-08-04 MED ORDER — POTASSIUM CHLORIDE CRYS ER 20 MEQ PO TBCR: 40.0000 meq | EXTENDED_RELEASE_TABLET | Freq: Every day | ORAL | 0 refills | Status: DC

## 2016-08-04 MED ORDER — SODIUM BICARBONATE 650 MG PO TABS: 650.0000 mg | ORAL_TABLET | Freq: Two times a day (BID) | ORAL | 1 refills | Status: DC

## 2016-08-04 MED ORDER — POTASSIUM CHLORIDE CRYS ER 20 MEQ PO TBCR
40.0000 meq | EXTENDED_RELEASE_TABLET | Freq: Every day | ORAL | Status: DC
  Administered 2016-08-04: 40 meq via ORAL
  Filled 2016-08-04: qty 2

## 2016-08-04 NOTE — Progress Notes (Signed)
Pt discharged home. VSS. IV d/c with cath intact. Discharge instructions given and pt verbalized understanding. Pt escorted to ED to awaiting private vehicle.

## 2016-08-04 NOTE — Progress Notes (Signed)
Pt refusing to ambulate in the hallway for O2 sat check.  Checked O2 sat while up in room and was 96% on RA.

## 2016-08-04 NOTE — Progress Notes (Signed)
CRITICAL VALUE ALERT  Critical Value:  Hemoglobin 6.8  Date & Time Notied:  08/04/2016 8335  Provider Notified: Stann Mainland  Orders Received/Actions taken: MD returned call. No new orders at this time.

## 2016-08-04 NOTE — Discharge Summary (Signed)
[name] A Ocampo Jr. MRN: [id number] DOB/AGE: [birth year] 34 y.o.  Admit date: 08/01/2016 Discharge date: 08/04/2016  Primary Care Physician:  Billee Cashing, MD   Discharge Diagnoses:   Patient Active Problem List   Diagnosis Date Noted  . Tachycardia with heart rate 121-140 beats per minute with ambulation 08/04/2016  . Anemia of chronic disease 08/02/2016  . Macrocytosis due to Hydroxyurea 08/02/2016  . Chronic, continuous use of opioids 08/30/2012  . Chronic headaches 07/10/2012  . Gynecomastia, male 07/10/2012  . Sickle cell nephropathy (HCC) 07/10/2012  . Systolic murmur 12/08/2011  . Migraine 11/26/2009  . CHRONIC KIDNEY DISEASE STAGE II (MILD) 03/06/2009  . Sickle cell disease, type SS.  06/18/2008  . TOBACCO ABUSE 05/22/2007    DISCHARGE MEDICATION: Allergies as of 08/04/2016      Reactions   Morphine And Related Other (See Comments)   "real bad headaches"      Medication List    TAKE these medications   enalapril 5 MG tablet Commonly known as:  VASOTEC Take 5 mg by mouth daily.   hydroxyurea 500 MG capsule Commonly known as:  HYDREA TAKE 3 CAPSULES (1,500 MG TOTAL) BY MOUTH DAILY.   nortriptyline 25 MG capsule Commonly known as:  PAMELOR TAKE 1 CAPSULE (25 MG TOTAL) BY MOUTH AT BEDTIME.   Oxycodone HCl 10 MG Tabs Take 10 mg by mouth every 6 (six) hours as needed.   sodium bicarbonate 650 MG tablet Take 1 tablet (650 mg total) by mouth 2 (two) times daily.         Consults:    SIGNIFICANT DIAGNOSTIC STUDIES:  No results found.     No results found for this or any previous visit (from the past 240 hour(s)).  BRIEF ADMITTING H & P: [name] A Shunta Brain. is a 36 y.o. male with medical history significant of sickle cell anemia, polysubstance abuse, and CKD stage III; who presents with complaints of complaints of pain in his arms, legs, and lower back since yesterday afternoon around 1 PM. Patient reports pain is similar to previous sickle  cell pain crises. Patient tried using home oxycodone without relief of symptoms. Associated symptoms included some mild nausea and vomiting. Denies having any significant fever, chills, shortness of breath, chest pain, dysuria, abdominal pain, or worsening cough. Is followed by the sickle cell clinic in [place], [place]. Review of records shows the patient has a previous history of polysubstance abuse but currently denies.  ED Course: Upon admission to the emergency department patient seen to be afebrile with heart rates 106-116, and all other vitals within normal limits. Labs revealed WBC 13.2, hemoglobin 7.8, sodium 134, potassium 4.4, chloride 111, CO2 9, BUN 27, creatinine 1.79, glucose 133. UDS was ordered, but unable to be obtained yet. Patient received multiple rounds of hydromorphone without relief of symptoms. [facility] called to admit.   Hospital Course:  Present on Admission: . (Resolved) Sickle cell pain crisis (HCC) . (Resolved) Leukocytosis . (Resolved) Vaso-occlusive sickle cell crisis (HCC) . CHRONIC KIDNEY DISEASE STAGE II (MILD) . (Resolved) Hb-SS disease with crisis (HCC) . Anemia of chronic disease . Macrocytosis due to Hydroxyurea . Sickle cell nephropathy (HCC) . (Resolved) Sickle cell crisis [facility])   [name] [name] is an Opiate tolerant patient with Hb SS who is on Hydrea and rarely hospitalized for sickle cell crises. However on this occasion he experienced a vaso-occlusive episode that could not be effectively managed with his usual oral analgesics and presented to the hospital for management. He was admitted  and started on Dilaudid via PCA and IVF. Toradol was held on admission due to elevated Creatinine. As the pain improved he was weaned from the PCA and transitioned to Oxycodone. Pt's pain remained at 23-/10 with use of Oxycodone only and he was discharged home on pre-admission regimen of pain medication.   Pt also had an elevated Cr in the setting of an unknown baseline. The  creatinine levels documented in our records were all during acute illness and the most recent lab data was during an acute injury to the kidney. However his renal function improved to the normal range with IV hydration and at the time of discharge his Cr was 1.16. The more concerning renal abnormality was a Non-Anion Gap Metabolic acidosis. His initial serum bicarbonate was 9 and with oral supplementation of bicarbonate and IVF serum bicarbonate increased to 20 by the time of discharge. I spoke with Nephrologist who based on the information provided agreed that a consult in hospital would add nothing to the investigation or management and agreed with bicarbonate supplementation. A 24 hour urine collection for urine creatinine and urine protein was ordered but patient was unable to complete as he required an early discharge. This will be deferred to the Nephrologist with whom he has an appointment on 09/09/2016.   Anemia of Chronic Disease with Macrocytosis: Pt is on Hydrea and compliance is reflected in the elevated MCV. His initial  Hb was 7.8 g/dL. He had a mild decrease in Hb to 6.8 g/dL however he also had a concurrent increase in reticulocytosis. He is  Continued on Hydrea and is to follow up with his PMD in 1 week for labs.   Pt also was noted to have a tachycardia up to 140 with ambulation.He has no observable dyspnea or increased work of breathing. His HR returned to baseline within 1 minute and saturations were maintained in the normal range. A 12 lead EKG showed sinus tachycardia and the patient does have a Systolic Ejection Murmur. I recommends a 2-D ECHO or an out patient or referral to cardiology.  Pt also has a h/o chronic headaches and was continued on Nortriptyline HS.         Disposition and Follow-up: Pt discharged home in good condition. He has appointment scheduled for Nephrology on 09/09/16 and needs to follow up with his PMD in 1 week.    Discharge Instructions    Activity as  tolerated - No restrictions    Complete by:  As directed    Diet general    Complete by:  As directed       DISCHARGE EXAM:  General: Alert, awake, oriented x3, in no distress. He is well appearing.  HEENT: [place]/AT PEERL, EOMI, anicteric Neck: Trachea midline, no masses, no thyromegal,y no JVD, no carotid bruit OROPHARYNX: Moist, No exudate/ erythema/lesions.  Heart: Regular rate and rhythm, with II/VI SEM at base without radiation. There were no rubs, gallops or S3. PMI non-displaced. Exam reveals no decreased pulses. Pulmonary/Chest: Normal effort. Breath sounds normal. No. Apnea. Clear to auscultation,no stridor, no wheezing and no rhonchi noted. No respiratory distress and no tenderness noted. Abdomen: Soft, nontender, nondistended, normal bowel sounds, no masses no hepatosplenomegaly noted. No fluid wave and no ascites. There is no guarding or rebound. Neuro: Alert and oriented to person, place and time. Normal motor skills, Displays no atrophy or tremors and exhibits normal muscle tone. No focal neurological deficits noted cranial nerves II through XII grossly intact. No sensory deficit noted.  Strength  at baseline in bilateral upper and lower extremities. Gait normal. Musculoskeletal: No warmth swelling or erythema around joints, no spinal tenderness noted. Psychiatric: Patient alert and oriented x3, good insight and cognition, good recent to remote recall. Mood, memory, affect and judgement normal Skin: Skin is warm and dry. No bruising, no ecchymosis and no rash noted. Pt is not diaphoretic. No erythema. No pallor   Blood pressure 119/78, pulse (!) 107, temperature 98.4 F (36.9 C), temperature source Oral, resp. rate 16, height 5\' 3"  (1.6 m), weight 61.2 kg (135 lb), SpO2 100 %.   Recent Labs  08/03/16 0712 08/04/16 0654  NA 136 135  K 3.9 3.3*  CL 111 109  CO2 18* 20*  GLUCOSE 102* 95  BUN 17 12  CREATININE 1.27* 1.16  CALCIUM 8.9 8.8*    Recent Labs   08/01/16 1840  AST 74*  ALT 48  ALKPHOS 169*  BILITOT 3.5*  PROT 8.0  ALBUMIN 4.4    Recent Labs  08/01/16 1849  LIPASE 42    Recent Labs  08/02/16 1222 08/04/16 0654  WBC 13.3* 8.6  NEUTROABS 5.9 4.2  HGB 7.4* 6.8*  HCT 19.3* 18.3*  MCV 128.7* 129.8*  PLT 172 267     Total time spent including face to face and decision making was greater than 30 minutes  Signed: Joselinne Lawal A. 08/04/2016, 2:14 PM

## 2016-08-15 IMAGING — DX DG CHEST 2V
2 series · 2 of 2 positions shown · non-contrast
Comparison: 11/20/2014

CLINICAL DATA: Left-sided chest pain with inspiration began
yesterday. History of sickle cell. Smoker.

EXAM:
CHEST  2 VIEW

[chest pa]
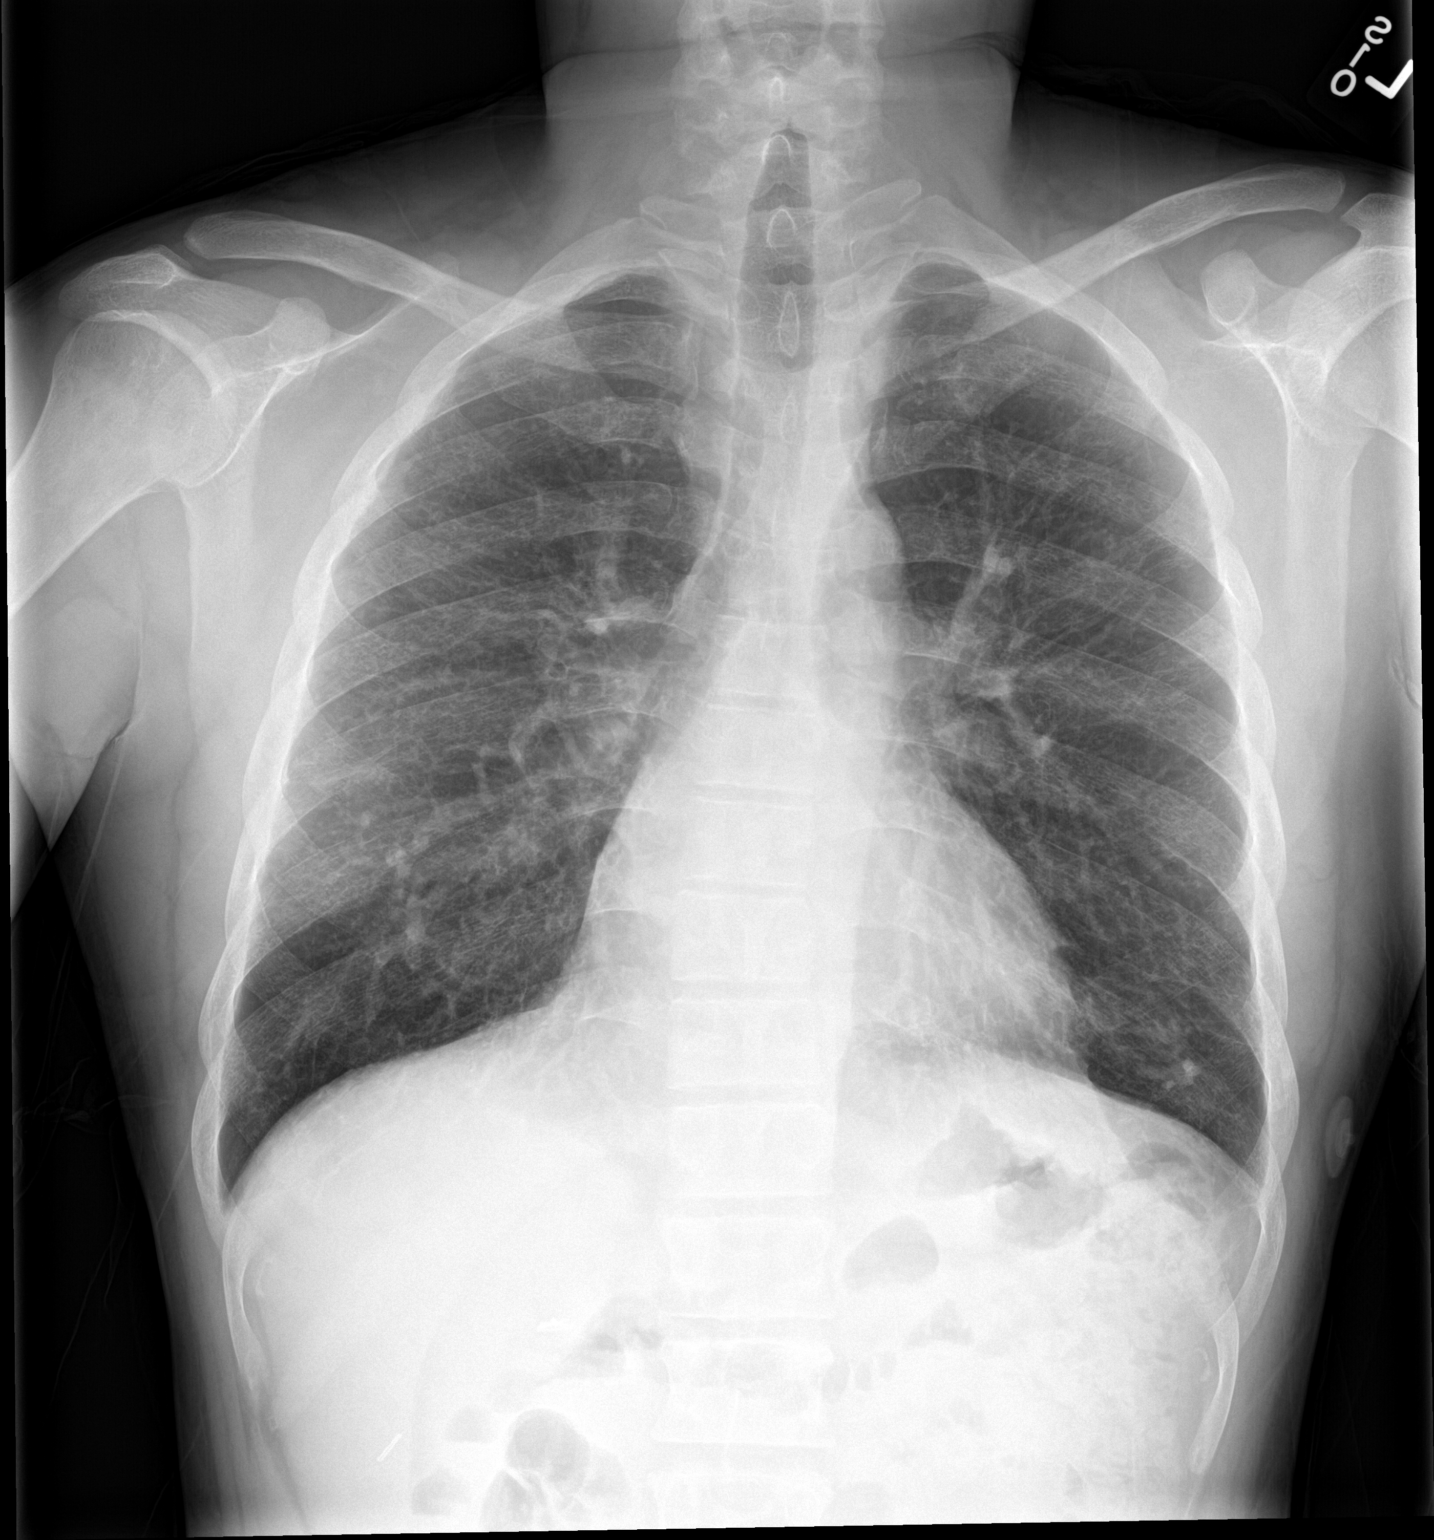

[chest lat]
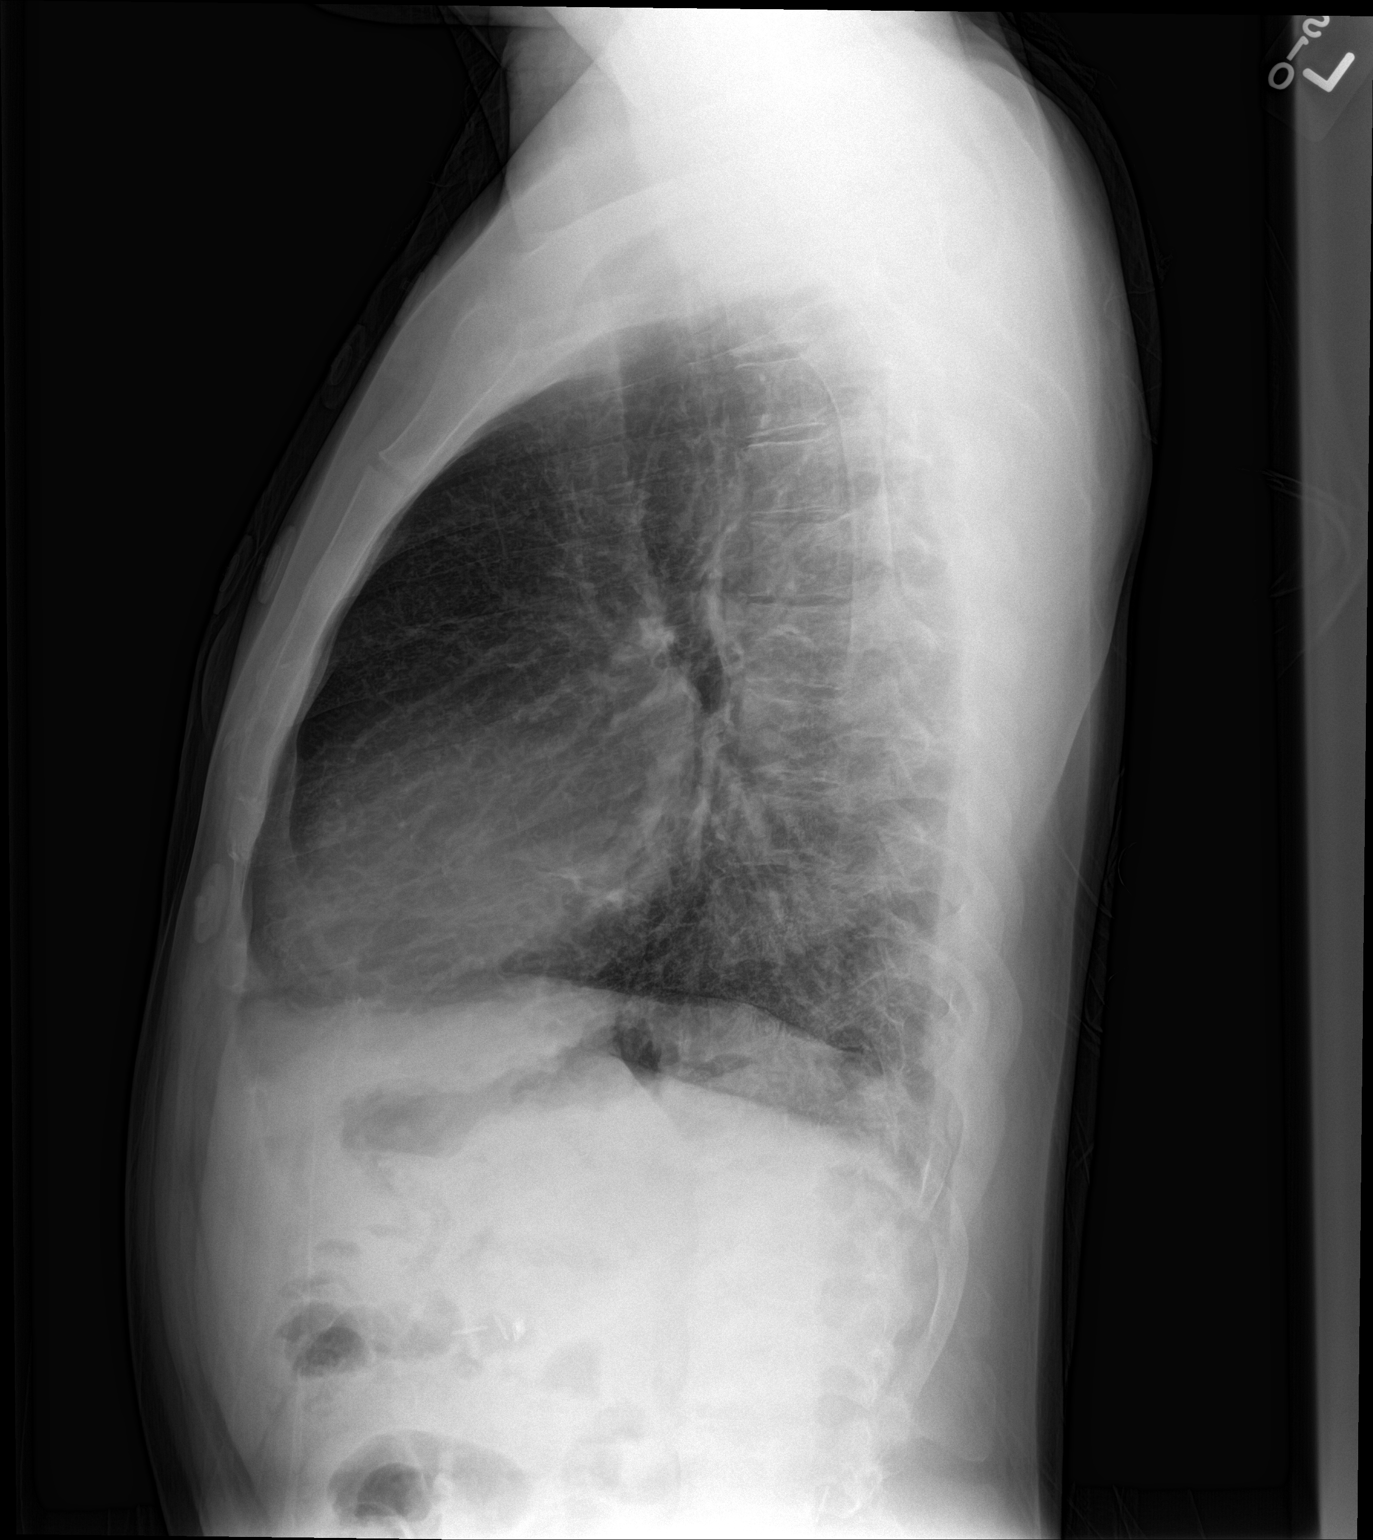

[2 of 2 positions shown; findings below may reference images not displayed]

FINDINGS: Heart size is normal. There is prominence of interstitial markings.
Stable scarring in the right mid lung zone. No focal consolidations
or pleural effusions are identified. Surgical clips are identified
in the right upper quadrant.
IMPRESSION: Stable appearance of the chest. No focal acute pulmonary
abnormality.

## 2016-08-23 ENCOUNTER — Other Ambulatory Visit: Payer: Self-pay | Admitting: Family Medicine

## 2016-08-28 DIAGNOSIS — Z79891 Long term (current) use of opiate analgesic: Secondary | ICD-10-CM | POA: Diagnosis not present

## 2016-09-06 DIAGNOSIS — E872 Acidosis: Secondary | ICD-10-CM | POA: Diagnosis not present

## 2016-09-06 DIAGNOSIS — Z720 Tobacco use: Secondary | ICD-10-CM | POA: Diagnosis not present

## 2016-09-06 DIAGNOSIS — D571 Sickle-cell disease without crisis: Secondary | ICD-10-CM | POA: Diagnosis not present

## 2016-09-06 DIAGNOSIS — N08 Glomerular disorders in diseases classified elsewhere: Secondary | ICD-10-CM | POA: Diagnosis not present

## 2016-09-06 DIAGNOSIS — N182 Chronic kidney disease, stage 2 (mild): Secondary | ICD-10-CM | POA: Diagnosis not present

## 2016-09-06 DIAGNOSIS — N183 Chronic kidney disease, stage 3 (moderate): Secondary | ICD-10-CM | POA: Diagnosis not present

## 2016-09-06 DIAGNOSIS — D519 Vitamin B12 deficiency anemia, unspecified: Secondary | ICD-10-CM | POA: Diagnosis not present

## 2016-09-06 DIAGNOSIS — D7589 Other specified diseases of blood and blood-forming organs: Secondary | ICD-10-CM | POA: Diagnosis not present

## 2016-09-07 DIAGNOSIS — N183 Chronic kidney disease, stage 3 (moderate): Secondary | ICD-10-CM | POA: Diagnosis not present

## 2016-09-27 DIAGNOSIS — D571 Sickle-cell disease without crisis: Secondary | ICD-10-CM | POA: Diagnosis not present

## 2016-09-27 DIAGNOSIS — R945 Abnormal results of liver function studies: Secondary | ICD-10-CM | POA: Diagnosis not present

## 2016-09-27 DIAGNOSIS — D7589 Other specified diseases of blood and blood-forming organs: Secondary | ICD-10-CM | POA: Diagnosis not present

## 2016-09-27 DIAGNOSIS — E872 Acidosis: Secondary | ICD-10-CM | POA: Diagnosis not present

## 2016-09-27 DIAGNOSIS — N08 Glomerular disorders in diseases classified elsewhere: Secondary | ICD-10-CM | POA: Diagnosis not present

## 2016-09-27 DIAGNOSIS — Z79891 Long term (current) use of opiate analgesic: Secondary | ICD-10-CM | POA: Diagnosis not present

## 2016-09-27 DIAGNOSIS — N183 Chronic kidney disease, stage 3 (moderate): Secondary | ICD-10-CM | POA: Diagnosis not present

## 2016-09-27 DIAGNOSIS — Z720 Tobacco use: Secondary | ICD-10-CM | POA: Diagnosis not present

## 2016-10-14 ENCOUNTER — Emergency Department (HOSPITAL_COMMUNITY): Payer: Medicare Other

## 2016-10-14 ENCOUNTER — Encounter (HOSPITAL_COMMUNITY): Payer: Self-pay | Admitting: *Deleted

## 2016-10-14 ENCOUNTER — Inpatient Hospital Stay (HOSPITAL_COMMUNITY)
Admission: EM | Admit: 2016-10-14 | Discharge: 2016-10-18 | DRG: 812 | Disposition: A | Discharge status: Home / Self Care | Payer: Medicare Other | Attending: Internal Medicine | Admitting: Internal Medicine

## 2016-10-14 DIAGNOSIS — F17210 Nicotine dependence, cigarettes, uncomplicated: Secondary | ICD-10-CM | POA: Diagnosis present

## 2016-10-14 DIAGNOSIS — R6510 Systemic inflammatory response syndrome (SIRS) of non-infectious origin without acute organ dysfunction: Secondary | ICD-10-CM | POA: Diagnosis present

## 2016-10-14 DIAGNOSIS — E860 Dehydration: Secondary | ICD-10-CM | POA: Diagnosis present

## 2016-10-14 DIAGNOSIS — G894 Chronic pain syndrome: Secondary | ICD-10-CM | POA: Diagnosis present

## 2016-10-14 DIAGNOSIS — M79605 Pain in left leg: Secondary | ICD-10-CM | POA: Diagnosis present

## 2016-10-14 DIAGNOSIS — Z79891 Long term (current) use of opiate analgesic: Secondary | ICD-10-CM | POA: Diagnosis not present

## 2016-10-14 DIAGNOSIS — G43909 Migraine, unspecified, not intractable, without status migrainosus: Secondary | ICD-10-CM | POA: Diagnosis present

## 2016-10-14 DIAGNOSIS — N182 Chronic kidney disease, stage 2 (mild): Secondary | ICD-10-CM | POA: Diagnosis not present

## 2016-10-14 DIAGNOSIS — D649 Anemia, unspecified: Secondary | ICD-10-CM | POA: Diagnosis present

## 2016-10-14 DIAGNOSIS — M879 Osteonecrosis, unspecified: Secondary | ICD-10-CM | POA: Diagnosis present

## 2016-10-14 DIAGNOSIS — R079 Chest pain, unspecified: Secondary | ICD-10-CM | POA: Diagnosis present

## 2016-10-14 DIAGNOSIS — R0902 Hypoxemia: Secondary | ICD-10-CM | POA: Diagnosis not present

## 2016-10-14 DIAGNOSIS — E875 Hyperkalemia: Secondary | ICD-10-CM | POA: Diagnosis not present

## 2016-10-14 DIAGNOSIS — R0602 Shortness of breath: Secondary | ICD-10-CM | POA: Diagnosis not present

## 2016-10-14 DIAGNOSIS — M79604 Pain in right leg: Secondary | ICD-10-CM | POA: Diagnosis present

## 2016-10-14 DIAGNOSIS — Z79899 Other long term (current) drug therapy: Secondary | ICD-10-CM

## 2016-10-14 DIAGNOSIS — R0682 Tachypnea, not elsewhere classified: Secondary | ICD-10-CM | POA: Diagnosis not present

## 2016-10-14 DIAGNOSIS — E872 Acidosis: Secondary | ICD-10-CM | POA: Diagnosis present

## 2016-10-14 DIAGNOSIS — D638 Anemia in other chronic diseases classified elsewhere: Secondary | ICD-10-CM | POA: Diagnosis not present

## 2016-10-14 DIAGNOSIS — Z8673 Personal history of transient ischemic attack (TIA), and cerebral infarction without residual deficits: Secondary | ICD-10-CM

## 2016-10-14 DIAGNOSIS — Z885 Allergy status to narcotic agent status: Secondary | ICD-10-CM | POA: Diagnosis not present

## 2016-10-14 DIAGNOSIS — N189 Chronic kidney disease, unspecified: Secondary | ICD-10-CM

## 2016-10-14 DIAGNOSIS — D5700 Hb-SS disease with crisis, unspecified: Principal | ICD-10-CM | POA: Diagnosis present

## 2016-10-14 DIAGNOSIS — M79603 Pain in arm, unspecified: Secondary | ICD-10-CM | POA: Diagnosis present

## 2016-10-14 DIAGNOSIS — M549 Dorsalgia, unspecified: Secondary | ICD-10-CM | POA: Diagnosis present

## 2016-10-14 DIAGNOSIS — R51 Headache: Secondary | ICD-10-CM | POA: Diagnosis not present

## 2016-10-14 DIAGNOSIS — N179 Acute kidney failure, unspecified: Secondary | ICD-10-CM | POA: Diagnosis present

## 2016-10-14 DIAGNOSIS — E871 Hypo-osmolality and hyponatremia: Secondary | ICD-10-CM | POA: Diagnosis not present

## 2016-10-14 LAB — CBC WITH DIFFERENTIAL/PLATELET
BASOS ABS: 0 10*3/uL (ref 0.0–0.1)
Basophils Relative: 0 %
EOS ABS: 0 10*3/uL (ref 0.0–0.7)
EOS PCT: 0 %
HCT: 25.7 % — ABNORMAL LOW (ref 39.0–52.0)
HEMOGLOBIN: 9.5 g/dL — AB (ref 13.0–17.0)
Lymphocytes Relative: 13 %
Lymphs Abs: 1.8 10*3/uL (ref 0.7–4.0)
MCH: 44.2 pg — ABNORMAL HIGH (ref 26.0–34.0)
MCHC: 37 g/dL — ABNORMAL HIGH (ref 30.0–36.0)
MCV: 119.5 fL — ABNORMAL HIGH (ref 78.0–100.0)
MONO ABS: 1.4 10*3/uL — AB (ref 0.1–1.0)
MONOS PCT: 10 %
Neutro Abs: 10.6 10*3/uL — ABNORMAL HIGH (ref 1.7–7.7)
Neutrophils Relative %: 77 %
PLATELETS: 188 10*3/uL (ref 150–400)
RBC: 2.15 MIL/uL — ABNORMAL LOW (ref 4.22–5.81)
RDW: 17.5 % — ABNORMAL HIGH (ref 11.5–15.5)
WBC: 13.8 10*3/uL — AB (ref 4.0–10.5)

## 2016-10-14 LAB — RETICULOCYTES
RBC.: 2.15 MIL/uL — AB (ref 4.22–5.81)
RETIC CT PCT: 6 % — AB (ref 0.4–3.1)
Retic Count, Absolute: 129 10*3/uL (ref 19.0–186.0)

## 2016-10-14 LAB — COMPREHENSIVE METABOLIC PANEL
ALBUMIN: 4.5 g/dL (ref 3.5–5.0)
ALK PHOS: 149 U/L — AB (ref 38–126)
ALT: 78 U/L — AB (ref 17–63)
AST: 99 U/L — AB (ref 15–41)
Anion gap: 9 (ref 5–15)
BILIRUBIN TOTAL: 5.1 mg/dL — AB (ref 0.3–1.2)
BUN: 32 mg/dL — AB (ref 6–20)
CO2: 18 mmol/L — ABNORMAL LOW (ref 22–32)
CREATININE: 2.1 mg/dL — AB (ref 0.61–1.24)
Calcium: 8.9 mg/dL (ref 8.9–10.3)
Chloride: 107 mmol/L (ref 101–111)
GFR calc Af Amer: 46 mL/min — ABNORMAL LOW (ref >60)
GFR, EST NON AFRICAN AMERICAN: 40 mL/min — AB (ref >60)
GLUCOSE: 166 mg/dL — AB (ref 65–99)
POTASSIUM: 6.1 mmol/L — AB (ref 3.5–5.1)
Sodium: 134 mmol/L — ABNORMAL LOW (ref 135–145)
TOTAL PROTEIN: 8.2 g/dL — AB (ref 6.5–8.1)

## 2016-10-14 LAB — BASIC METABOLIC PANEL
ANION GAP: 6 (ref 5–15)
BUN: 35 mg/dL — AB (ref 6–20)
CALCIUM: 8.7 mg/dL — AB (ref 8.9–10.3)
CO2: 19 mmol/L — AB (ref 22–32)
Chloride: 110 mmol/L (ref 101–111)
Creatinine, Ser: 2.12 mg/dL — ABNORMAL HIGH (ref 0.61–1.24)
GFR calc Af Amer: 46 mL/min — ABNORMAL LOW (ref >60)
GFR, EST NON AFRICAN AMERICAN: 39 mL/min — AB (ref >60)
GLUCOSE: 99 mg/dL (ref 65–99)
Sodium: 135 mmol/L (ref 135–145)

## 2016-10-14 LAB — I-STAT TROPONIN, ED: TROPONIN I, POC: 0 ng/mL (ref 0.00–0.08)

## 2016-10-14 LAB — MRSA PCR SCREENING: MRSA by PCR: NEGATIVE

## 2016-10-14 MED ORDER — ENOXAPARIN SODIUM 40 MG/0.4ML ~~LOC~~ SOLN
40.0000 mg | Freq: Every day | SUBCUTANEOUS | Status: DC
  Administered 2016-10-15: 40 mg via SUBCUTANEOUS
  Filled 2016-10-14 (×2): qty 0.4

## 2016-10-14 MED ORDER — HYDROXYUREA 500 MG PO CAPS
1500.0000 mg | ORAL_CAPSULE | Freq: Every day | ORAL | Status: DC
  Administered 2016-10-14 – 2016-10-18 (×5): 1500 mg via ORAL
  Filled 2016-10-14 (×5): qty 3

## 2016-10-14 MED ORDER — HYDROMORPHONE HCL 1 MG/ML IJ SOLN: 2.0000 mg | INTRAMUSCULAR | Status: DC

## 2016-10-14 MED ORDER — HYDROMORPHONE HCL 1 MG/ML IJ SOLN: 2.0000 mg | INTRAMUSCULAR | Status: AC | PRN

## 2016-10-14 MED ORDER — SODIUM CHLORIDE 0.9% FLUSH: 9.0000 mL | INTRAVENOUS | Status: DC | PRN

## 2016-10-14 MED ORDER — DIPHENHYDRAMINE HCL 50 MG/ML IJ SOLN
50.0000 mg | Freq: Once | INTRAMUSCULAR | Status: AC
  Administered 2016-10-14: 50 mg via INTRAVENOUS
  Filled 2016-10-14: qty 1

## 2016-10-14 MED ORDER — ONDANSETRON HCL 4 MG/2ML IJ SOLN
4.0000 mg | INTRAMUSCULAR | Status: DC | PRN
  Administered 2016-10-14: 4 mg via INTRAVENOUS
  Filled 2016-10-14: qty 2

## 2016-10-14 MED ORDER — HYDROMORPHONE HCL 1 MG/ML IJ SOLN: 2.0000 mg | INTRAMUSCULAR | Status: DC | PRN

## 2016-10-14 MED ORDER — SENNOSIDES-DOCUSATE SODIUM 8.6-50 MG PO TABS
1.0000 | ORAL_TABLET | Freq: Two times a day (BID) | ORAL | Status: DC
  Administered 2016-10-15 – 2016-10-16 (×2): 1 via ORAL
  Filled 2016-10-14 (×7): qty 1

## 2016-10-14 MED ORDER — NALOXONE HCL 0.4 MG/ML IJ SOLN: 0.4000 mg | INTRAMUSCULAR | Status: DC | PRN

## 2016-10-14 MED ORDER — SODIUM CHLORIDE 0.9 % IV SOLN
25.0000 mg | INTRAVENOUS | Status: DC | PRN
  Filled 2016-10-14: qty 0.5

## 2016-10-14 MED ORDER — HYDROMORPHONE HCL 1 MG/ML IJ SOLN
2.0000 mg | INTRAMUSCULAR | Status: DC
  Filled 2016-10-14: qty 2

## 2016-10-14 MED ORDER — DIPHENHYDRAMINE HCL 25 MG PO CAPS
25.0000 mg | ORAL_CAPSULE | ORAL | Status: DC | PRN
  Administered 2016-10-15 – 2016-10-17 (×2): 50 mg via ORAL
  Administered 2016-10-18: 25 mg via ORAL
  Filled 2016-10-14 (×3): qty 2

## 2016-10-14 MED ORDER — NORTRIPTYLINE HCL 25 MG PO CAPS
25.0000 mg | ORAL_CAPSULE | Freq: Every day | ORAL | Status: DC
  Administered 2016-10-14 – 2016-10-17 (×5): 25 mg via ORAL
  Filled 2016-10-14 (×5): qty 1

## 2016-10-14 MED ORDER — HYDROMORPHONE HCL 1 MG/ML IJ SOLN
2.0000 mg | INTRAMUSCULAR | Status: AC | PRN
  Administered 2016-10-14 (×2): 2 mg via INTRAVENOUS
  Filled 2016-10-14 (×2): qty 2

## 2016-10-14 MED ORDER — ONDANSETRON HCL 4 MG PO TABS: 4.0000 mg | ORAL_TABLET | ORAL | Status: DC | PRN

## 2016-10-14 MED ORDER — SODIUM CHLORIDE 0.9 % IV SOLN
1.0000 g | Freq: Once | INTRAVENOUS | Status: AC
  Administered 2016-10-14: 1 g via INTRAVENOUS
  Filled 2016-10-14: qty 10

## 2016-10-14 MED ORDER — ONDANSETRON HCL 4 MG/2ML IJ SOLN: 4.0000 mg | INTRAMUSCULAR | Status: DC | PRN

## 2016-10-14 MED ORDER — HYDROMORPHONE HCL 1 MG/ML IJ SOLN
2.0000 mg | INTRAMUSCULAR | Status: DC | PRN
  Administered 2016-10-14: 2 mg via INTRAVENOUS
  Filled 2016-10-14: qty 2

## 2016-10-14 MED ORDER — HYDROMORPHONE HCL 1 MG/ML IJ SOLN
2.0000 mg | INTRAMUSCULAR | Status: AC | PRN
  Administered 2016-10-14: 2 mg via INTRAVENOUS

## 2016-10-14 MED ORDER — SODIUM BICARBONATE 8.4 % IV SOLN
50.0000 meq | Freq: Once | INTRAVENOUS | Status: AC
  Administered 2016-10-14: 50 meq via INTRAVENOUS
  Filled 2016-10-14: qty 50

## 2016-10-14 MED ORDER — SODIUM CHLORIDE 0.45 % IV SOLN
INTRAVENOUS | Status: DC
  Administered 2016-10-14: 17:00:00 via INTRAVENOUS

## 2016-10-14 MED ORDER — HYDROMORPHONE 1 MG/ML IV SOLN
INTRAVENOUS | Status: DC
  Administered 2016-10-14: 23:00:00 via INTRAVENOUS
  Administered 2016-10-15: 3 mg via INTRAVENOUS
  Administered 2016-10-15: 1 mg via INTRAVENOUS
  Filled 2016-10-14: qty 25

## 2016-10-14 MED ORDER — SODIUM CHLORIDE 0.9 % IV BOLUS (SEPSIS)
500.0000 mL | Freq: Once | INTRAVENOUS | Status: AC
  Administered 2016-10-14: 500 mL via INTRAVENOUS

## 2016-10-14 MED ORDER — POLYETHYLENE GLYCOL 3350 17 G PO PACK: 17.0000 g | PACK | Freq: Every day | ORAL | Status: DC | PRN

## 2016-10-14 MED ORDER — SODIUM POLYSTYRENE SULFONATE 15 GM/60ML PO SUSP
30.0000 g | Freq: Once | ORAL | Status: AC
  Administered 2016-10-14: 30 g via ORAL
  Filled 2016-10-14: qty 120

## 2016-10-14 MED ORDER — DEXTROSE-NACL 5-0.45 % IV SOLN
INTRAVENOUS | Status: DC
  Administered 2016-10-14 – 2016-10-16 (×4): via INTRAVENOUS

## 2016-10-14 NOTE — ED Triage Notes (Signed)
Pt complains of sickle cell pain crisis, shortness of breath and chest pain for the past hour. Pt took 10mg  oxycodone 1 hour prior to arrival w/o relief.

## 2016-10-14 NOTE — ED Notes (Signed)
No respiratory or acute distress noted alert and oriented no reaction to medication noted call light in reach.

## 2016-10-14 NOTE — ED Notes (Signed)
Patient transported to X-ray 

## 2016-10-14 NOTE — Progress Notes (Signed)
Please call report to [place] at 2020.

## 2016-10-14 NOTE — ED Provider Notes (Signed)
[facility] DEPT Provider Note   CSN: [id number] Arrival date & time: 10/14/16  1405     History   Chief Complaint Chief Complaint  Patient presents with  . Sickle Cell Pain Crisis  . Chest Pain    HPI [name] [name]. is a 36 y.o. male with a history of sickle cell, CKD, and migraines who presents for evaluation of one hour acute onset of chest pain, SOB.  He denies any fevers or chills.  He reports he took 10mg  oxycodone 1 hour before arrival with out relief.  He reports this is his normal location for sickle cell pain crisis.  He attempted cut the interview short stating he needs pain medication before he can talk to me.  No recent history of trauma.   HPI  Past Medical History:  Diagnosis Date  . Bacterial pneumonia ~ 2012   "caught it here in the hospital" (09/25/2012)  . Chronic kidney disease    "from my sickle cell" (09/25/2012)  . Cyst of brain    "2 really small ones in the back of my head; inside; saw them w/MRI" (09/25/2012)  . GERD (gastroesophageal reflux disease)    "after I eat alot of spicey foods" (09/25/2012)  . Gynecomastia, male 07/10/2012  . History of blood transfusion    "always related to sickle cell crisis" (09/25/2012)  . Migraines    "take RX qd to prevent them" (09/25/2012)  . Sickle cell anemia (HCC)   . Sickle cell crisis ([place]) 09/25/2012  . Sickle cell nephropathy ([place]) 07/10/2012  . Tachycardia with heart rate 121-140 beats per minute with ambulation 08/04/2016    Patient Active Problem List   Diagnosis Date Noted  . Sickle cell crisis ([place]) 10/14/2016  . ARF (acute renal failure) ([place]) 10/14/2016  . Hyponatremia 10/14/2016  . Tachycardia with heart rate 121-140 beats per minute with ambulation 08/04/2016  . Normal anion gap metabolic acidosis 22/03/5425  . Anemia of chronic disease 08/02/2016  . Macrocytosis due to Hydroxyurea 08/02/2016  . Hyperkalemia 09/26/2012  . Chronic, continuous use of opioids 08/30/2012  . Chronic headaches  07/10/2012  . Gynecomastia, male 07/10/2012  . Sickle cell nephropathy ([place]) 07/10/2012  . Systolic murmur 08/06/7626  . Migraine 11/26/2009  . CHRONIC KIDNEY DISEASE STAGE II (MILD) 03/06/2009  . Sickle cell disease, type SS.  06/18/2008  . TOBACCO ABUSE 05/22/2007    Past Surgical History:  Procedure Laterality Date  . CHOLECYSTECTOMY  ~ 2012       Home Medications    Prior to Admission medications   Medication Sig Start Date End Date Taking? Authorizing Provider  hydroxyurea (HYDREA) 500 MG capsule TAKE 3 CAPSULES (1,500 MG TOTAL) BY MOUTH DAILY. 08/23/16  Yes Donnamae Jude, MD  naproxen sodium (ANAPROX) 220 MG tablet Take 440 mg by mouth daily as needed (pain).   Yes [provider]  nortriptyline (PAMELOR) 25 MG capsule TAKE 1 CAPSULE (25 MG TOTAL) BY MOUTH AT BEDTIME. 12/28/15  Yes Donnamae Jude, MD  Oxycodone HCl 10 MG TABS Take 10 mg by mouth every 6 (six) hours as needed.   Yes [provider]  sodium bicarbonate 650 MG tablet Take 1 tablet (650 mg total) by mouth 2 (two) times daily. Patient not taking: Reported on 10/14/2016 08/04/16   Leana Gamer, MD    Family History Family History  Problem Relation Age of Onset  . Breast cancer Mother     Social History Social History  Substance Use Topics  .  Smoking status: Current Some Day Smoker    Types: Cigarettes  . Smokeless tobacco: Never Used     Comment: 09/25/2012 "I don't buy cigarettes; bum one from friends q now and then"  . Alcohol use 0.0 oz/week     Comment: occasionally     Allergies   Morphine and related   Review of Systems Review of Systems  Constitutional: Negative for chills, diaphoresis, fatigue and fever.  Respiratory: Positive for shortness of breath. Negative for cough and wheezing.   Cardiovascular: Positive for chest pain. Negative for palpitations and leg swelling.  Gastrointestinal: Positive for nausea and vomiting. Negative for abdominal pain.    Musculoskeletal: Positive for back pain.     Physical Exam Updated Vital Signs BP (!) 118/57   Pulse 85   Temp 98 F (36.7 C) (Oral)   Resp 16   Wt 61.2 kg (135 lb)   SpO2 98%   BMI 23.91 kg/m   Physical Exam  Constitutional: He appears well-developed and well-nourished. No distress.  HENT:  Head: Normocephalic and atraumatic.  Mouth/Throat: No oropharyngeal exudate.  Eyes: Conjunctivae are normal. Right eye exhibits no discharge. Left eye exhibits no discharge. Scleral icterus is present.  Neck: Normal range of motion. No JVD present.  Cardiovascular: Normal rate, regular rhythm, normal heart sounds and intact distal pulses.  Exam reveals no gallop and no friction rub.   No murmur heard. Pulmonary/Chest: Effort normal and breath sounds normal. No stridor. No respiratory distress. He has no wheezes. He has no rales. He exhibits no tenderness.  Abdominal: Soft. He exhibits no distension and no mass. There is no tenderness.  Musculoskeletal: He exhibits no edema or deformity.  Neurological: He is alert. He exhibits normal muscle tone.  Skin: Skin is warm and dry. He is not diaphoretic.  Psychiatric: He has a normal mood and affect. His behavior is normal.  Nursing note and vitals reviewed.    ED Treatments / Results  Labs (all labs ordered are listed, but only abnormal results are displayed) Labs Reviewed  COMPREHENSIVE METABOLIC PANEL - Abnormal; Notable for the following:       Result Value   Sodium 134 (*)    Potassium 6.1 (*)    CO2 18 (*)    Glucose, Bld 166 (*)    BUN 32 (*)    Creatinine, Ser 2.10 (*)    Total Protein 8.2 (*)    AST 99 (*)    ALT 78 (*)    Alkaline Phosphatase 149 (*)    Total Bilirubin 5.1 (*)    GFR calc non Af Amer 40 (*)    GFR calc Af Amer 46 (*)    All other components within normal limits  CBC WITH DIFFERENTIAL/PLATELET - Abnormal; Notable for the following:    WBC 13.8 (*)    RBC 2.15 (*)    Hemoglobin 9.5 (*)    HCT 25.7 (*)     MCV 119.5 (*)    MCH 44.2 (*)    MCHC 37.0 (*)    RDW 17.5 (*)    Neutro Abs 10.6 (*)    Monocytes Absolute 1.4 (*)    All other components within normal limits  RETICULOCYTES - Abnormal; Notable for the following:    Retic Ct Pct 6.0 (*)    RBC. 2.15 (*)    All other components within normal limits  BASIC METABOLIC PANEL - Abnormal; Notable for the following:    Potassium >7.5 (*)    CO2 19 (*)  BUN 35 (*)    Creatinine, Ser 2.12 (*)    Calcium 8.7 (*)    GFR calc non Af Amer 39 (*)    GFR calc Af Amer 46 (*)    All other components within normal limits  URINALYSIS, ROUTINE W REFLEX MICROSCOPIC  HIV ANTIBODY (ROUTINE TESTING)  COMPREHENSIVE METABOLIC PANEL  CBC  BASIC METABOLIC PANEL  I-STAT TROPONIN, ED    EKG  EKG Interpretation  Date/Time:  Saturday October 14 2016 14:14:03 EDT Ventricular Rate:  88 PR Interval:    QRS Duration: 81 QT Interval:  346 QTC Calculation: 419 R Axis:   67 Text Interpretation:  Sinus rhythm Borderline T abnormalities, inferior leads Baseline wander in lead(s) III V4 No significant change since last tracing Confirmed by Orlie Dakin [phone]) on 10/14/2016 4:26:21 PM       Radiology Dg Chest 2 View  Result Date: 10/14/2016 CLINICAL DATA:  Sickle-cell crisis, chest pain, shortness of breath, tachycardia EXAM: CHEST  2 VIEW COMPARISON:  03/11/2015 FINDINGS: Normal heart size. Stable chronic bronchitic change and mild interstitial prominence without superimposed pneumonia, collapse or consolidation. Negative for edema, effusion or pneumothorax. Trachea is midline. Remote cholecystectomy noted. Stable osseous changes of chronic sickle-cell disease. IMPRESSION: Stable chest exam.  No superimposed acute process. Electronically Signed   By: Jerilynn Mages.  Shick M.D.   On: 10/14/2016 15:43    Procedures Procedures (including critical care time)  Medications Ordered in ED Medications  0.45 % sodium chloride infusion ( Intravenous New Bag/Given  10/14/16 1647)  HYDROmorphone (DILAUDID) 1 mg/mL PCA injection (not administered)  HYDROmorphone (DILAUDID) injection 2 mg (2 mg Intravenous Given 10/14/16 1509)    Or  HYDROmorphone (DILAUDID) injection 2 mg ( Subcutaneous See Alternative 10/14/16 1509)  HYDROmorphone (DILAUDID) injection 2 mg (2 mg Intravenous Given 10/14/16 1716)  sodium chloride 0.9 % bolus 500 mL (0 mLs Intravenous Stopped 10/14/16 1933)  diphenhydrAMINE (BENADRYL) injection 50 mg (50 mg Intravenous Given 10/14/16 1758)     Initial Impression / Assessment and Plan / ED Course  I have reviewed the triage vital signs and the nursing notes.  Pertinent labs & imaging results that were available during my care of the patient were reviewed by me and considered in my medical decision making (see chart for details).  Clinical Course as of Oct 14 2008  Sat Oct 14, 2016  2003 Called Dr. Maudie Mercury and informed him of elevated potassium.  We will repeat an EKG, he says he has already placed orders for the potassium.   [EH]    Clinical Course User Index [EH] Lorin Glass, PA-C   Falon A Marcial Pacas. presents for evaluation of acute chest and back pain consistent with a sickle cell pain crisis.  He was found to have an elevated Cr at 2.12 and therefore unable to give him torodol.  His chest x-ray did not show any consolidation or acute abnormalities. Initial EKG with out acute abnormalities and initial troponin is negative.  He was found to have an elevated potassium but no EKG changes.  Repeat draw was performed to verify potassium which was still elevated.     Reticulocytes elevated. ALT, AST are both elevated. His pain was managed in the ED with dilaudid.  He requested extra pain medication multiple times but he was found to be somnolent, falling asleep inbetween questions and I felt additional narcotic pain medication would put him at an unsafe risk of respiratory depression.  Tylenol and NSAIDS were considered but contraindicated based  on  elevated Cr and ALT/AST.    Patient will be admitted to hospital for continued pain control, elevation of acute renal failure.   Final Clinical Impressions(s) / ED Diagnoses   Final diagnoses:  ARF (acute renal failure) [facility])    New Prescriptions New Prescriptions   No medications on file     Ollen Gross 10/15/16 1110    Orlie Dakin, MD 10/15/16 [phone]

## 2016-10-14 NOTE — H&P (Signed)
TR[facility] H&P   Patient Demographics:    [name] [name], is a 36 y.o. male  MRN: [id number]   DOB - 10-18-1980  Admit Date - 10/14/2016  Outpatient Primary MD for the patient is [name], MD  Referring MD/NP/PA:     Outpatient Specialists:   Patient coming from:   home  Chief Complaint  Patient presents with  . Sickle Cell Pain Crisis  . Chest Pain      HPI:    [name] [name]  is a 36 y.o. male, w sickle cell , ckd stage2, c/o back pain, arm pain.  Pt denies cp, palp, sob, lower ext. Pt states that his pain is siimillar to his sickle cell pain and has been going on for about 1 day.   In ED,  CXR negative. EKG NSR at 88, nl axis, no st-t changes consistent with ischemia.  Trop negative. Hgb 9.5, K =6.1, Na 134,  Bun/Creat 32/2.10  Pt will be admitted for sickle cell crisis, hyperkalemia, ARF on CRF, and hyponatremia.      Review of systems:    In addition to the HPI above, No Fever-chills, No Headache, No changes with Vision or hearing, No problems swallowing food or Liquids, No Chest pain, Cough or Shortness of Breath, No Abdominal pain, No Nausea or Vommitting, Bowel movements are regular, No Blood in stool or Urine, No dysuria, No new skin rashes or bruises, No new joints pains-aches,  No new weakness, tingling, numbness in any extremity, No recent weight gain or loss, No polyuria, polydypsia or polyphagia, No significant Mental Stressors. No radiation of pain down legs,    A full 10 point Review of Systems was done, except as stated above, all other Review of Systems were negative.   With Past History of the following :    Past Medical History:  Diagnosis Date  . Bacterial pneumonia ~ 2012   "caught it here in the hospital" (09/25/2012)  . Chronic kidney disease    "from my sickle cell" (09/25/2012)  . Cyst of brain    "2 really small ones in  the back of my head; inside; saw them w/MRI" (09/25/2012)  . GERD (gastroesophageal reflux disease)    "after I eat alot of spicey foods" (09/25/2012)  . Gynecomastia, male 07/10/2012  . History of blood transfusion    "always related to sickle cell crisis" (09/25/2012)  . Migraines    "take RX qd to prevent them" (09/25/2012)  . Sickle cell anemia (HCC)   . Sickle cell crisis ([place]) 09/25/2012  . Sickle cell nephropathy ([place]) 07/10/2012  . Tachycardia with heart rate 121-140 beats per minute with ambulation 08/04/2016      Past Surgical History:  Procedure Laterality Date  . CHOLECYSTECTOMY  ~ 2012      Social History:     Social History  Substance Use Topics  . Smoking status: Current  Some Day Smoker    Types: Cigarettes  . Smokeless tobacco: Never Used     Comment: 09/25/2012 "I don't buy cigarettes; bum one from friends q now and then"  . Alcohol use 0.0 oz/week     Comment: occasionally     Lives - at home  Mobility - walks by self    Family History :     Family History  Problem Relation Age of Onset  . Breast cancer Mother      Home Medications:   Prior to Admission medications   Medication Sig Start Date End Date Taking? Authorizing Provider  hydroxyurea (HYDREA) 500 MG capsule TAKE 3 CAPSULES (1,500 MG TOTAL) BY MOUTH DAILY. 08/23/16  Yes Donnamae Jude, MD  naproxen sodium (ANAPROX) 220 MG tablet Take 440 mg by mouth daily as needed (pain).   Yes [provider]  nortriptyline (PAMELOR) 25 MG capsule TAKE 1 CAPSULE (25 MG TOTAL) BY MOUTH AT BEDTIME. 12/28/15  Yes Donnamae Jude, MD  Oxycodone HCl 10 MG TABS Take 10 mg by mouth every 6 (six) hours as needed.   Yes [provider]  sodium bicarbonate 650 MG tablet Take 1 tablet (650 mg total) by mouth 2 (two) times daily. Patient not taking: Reported on 10/14/2016 08/04/16   Leana Gamer, MD     Allergies:     Allergies  Allergen Reactions  . Morphine And Related Other (See Comments)      "real bad headaches"     Physical Exam:   Vitals  Blood pressure (!) 118/57, pulse 85, temperature 98 F (36.7 C), temperature source Oral, resp. rate 16, weight 61.2 kg (135 lb), SpO2 98 %.   1. General  lying in bed in NAD,    2. Normal affect and insight, Not Suicidal or Homicidal, Awake Alert, Oriented X 3.  3. No F.N deficits, ALL C.Nerves Intact, Strength 5/5 all 4 extremities, Sensation intact all 4 extremities, Plantars down going.  4. Ears and Eyes appear Normal, Conjunctivae clear, PERRLA. Moist Oral Mucosa.  5. Supple Neck, No JVD, No cervical lymphadenopathy appriciated, No Carotid Bruits.  6. Symmetrical Chest wall movement, Good air movement bilaterally, CTAB.  7. RRR, No Gallops, Rubs or Murmurs, No Parasternal Heave.  8. Positive Bowel Sounds, Abdomen Soft, No tenderness, No organomegaly appriciated,No rebound -guarding or rigidity.  9.  No Cyanosis, Normal Skin Turgor, No Skin Rash or Bruise.  10. Good muscle tone,  joints appear normal , no effusions, Normal ROM.  11. No Palpable Lymph Nodes in Neck or Axillae     Data Review:    CBC  Recent Labs Lab 10/14/16 1448  WBC 13.8*  HGB 9.5*  HCT 25.7*  PLT 188  MCV 119.5*  MCH 44.2*  MCHC 37.0*  RDW 17.5*  LYMPHSABS 1.8  MONOABS 1.4*  EOSABS 0.0  BASOSABS 0.0   ------------------------------------------------------------------------------------------------------------------  Chemistries   Recent Labs Lab 10/14/16 1448 10/14/16 1907  NA 134* 135  K 6.1* >7.5*  CL 107 110  CO2 18* 19*  GLUCOSE 166* 99  BUN 32* 35*  CREATININE 2.10* 2.12*  CALCIUM 8.9 8.7*  AST 99*  --   ALT 78*  --   ALKPHOS 149*  --   BILITOT 5.1*  --    ------------------------------------------------------------------------------------------------------------------ estimated creatinine clearance is 39.9 mL/min (A) (by C-G formula based on SCr of 2.12 mg/dL  (H)). ------------------------------------------------------------------------------------------------------------------ No results for input(s): TSH, T4TOTAL, T3FREE, THYROIDAB in the last 72 hours.  Invalid input(s): FREET3  Coagulation profile No results for input(s): INR, PROTIME in the last 168 hours. ------------------------------------------------------------------------------------------------------------------- No results for input(s): DDIMER in the last 72 hours. -------------------------------------------------------------------------------------------------------------------  Cardiac Enzymes No results for input(s): CKMB, TROPONINI, MYOGLOBIN in the last 168 hours.  Invalid input(s): CK ------------------------------------------------------------------------------------------------------------------ No results found for: BNP   ---------------------------------------------------------------------------------------------------------------  Urinalysis    Component Value Date/Time   COLORURINE YELLOW 08/02/2016 0211   APPEARANCEUR CLEAR 08/02/2016 0211   LABSPEC 1.014 08/02/2016 0211   PHURINE 7.0 08/02/2016 0211   GLUCOSEU NEGATIVE 08/02/2016 0211   HGBUR NEGATIVE 08/02/2016 0211   HGBUR small 03/10/2010 1445   BILIRUBINUR NEGATIVE 08/02/2016 0211   KETONESUR NEGATIVE 08/02/2016 0211   PROTEINUR NEGATIVE 08/02/2016 0211   UROBILINOGEN 0.2 11/21/2014 2003   NITRITE NEGATIVE 08/02/2016 0211   LEUKOCYTESUR NEGATIVE 08/02/2016 0211    ----------------------------------------------------------------------------------------------------------------   Imaging Results:    Dg Chest 2 View  Result Date: 10/14/2016 CLINICAL DATA:  Sickle-cell crisis, chest pain, shortness of breath, tachycardia EXAM: CHEST  2 VIEW COMPARISON:  03/11/2015 FINDINGS: Normal heart size. Stable chronic bronchitic change and mild interstitial prominence without superimposed pneumonia, collapse or  consolidation. Negative for edema, effusion or pneumothorax. Trachea is midline. Remote cholecystectomy noted. Stable osseous changes of chronic sickle-cell disease. IMPRESSION: Stable chest exam.  No superimposed acute process. Electronically Signed   By: Jerilynn Mages.  Shick M.D.   On: 10/14/2016 15:43      Assessment & Plan:    Principal Problem:   Sickle cell crisis (HCC) Active Problems:   Hyperkalemia   Anemia of chronic disease   ARF (acute renal failure) (HCC)   Hyponatremia   Sickle cell crisis Iv hydration Dilaudid iv prn  Hyperkalemia Tele calclium gluconate iv Sodium bicarb 1 amp iv Kayexalate 30gm po x1 D5 ns iv Check cmp in 3 hours  ARF Check urine sodium, urine creatinine, urine eosinophils Check renal ultrasound Consider spep, upep as outpatient  Anemia Repeat cbc in am  Macrocytosis Check b12, folate, tsh  Hyponatremia Hydrate with nv iv Check cmp in am    DVT Prophylaxis Lovenox - SCDs  AM Labs Ordered, also please review Full Orders  Family Communication: Admission, patients condition and plan of care including tests being ordered have been discussed with the patient  who indicate understanding and agree with the plan and Code Status.  Code Status FULL CODE  Likely DC to  home  Condition GUARDED    Consults called: none  Admission status: inpatient   Time spent in minutes :45 minutes   Jani Gravel M.D on 10/14/2016 at 8:09 PM  Between 7am to 7pm - Pager - [phone]  After 7pm go to [email or web address] - password [facility]  [facility] - Office  [phone]

## 2016-10-14 NOTE — ED Provider Notes (Signed)
Complains of back pain and chest pain which he reports is sometimes typical of his sickle cell crisis which was onset 1.5 hours ago. He treated himself with oxycodone prior to coming here without relief. He also reports mild dyspnea. Denies other associated symptoms. On exam alert no distress lungs clear auscultation heart regular rate and rhythm abdomen nondistended nontender all 4 extremity is a redness or tenderness neurovascular intact   Orlie Dakin, MD 10/14/16 [phone]

## 2016-10-14 NOTE — ED Provider Notes (Signed)
Angiocath insertion Performed by: Sherwood Gambler T  Consent: Verbal consent obtained. Risks and benefits: risks, benefits and alternatives were discussed Time out: Immediately prior to procedure a "time out" was called to verify the correct patient, procedure, equipment, support staff and site/side marked as required.  Preparation: Patient was prepped and draped in the usual sterile fashion.  Vein Location: Right basilic  Ultrasound Guided  Gauge: 20  Normal blood return and flush without difficulty Patient tolerance: Patient tolerated the procedure well with no immediate complications.      Sherwood Gambler, MD 10/14/16 [phone]

## 2016-10-15 ENCOUNTER — Inpatient Hospital Stay (HOSPITAL_COMMUNITY): Payer: Medicare Other

## 2016-10-15 DIAGNOSIS — E872 Acidosis: Secondary | ICD-10-CM

## 2016-10-15 DIAGNOSIS — E875 Hyperkalemia: Secondary | ICD-10-CM

## 2016-10-15 DIAGNOSIS — N179 Acute kidney failure, unspecified: Secondary | ICD-10-CM

## 2016-10-15 DIAGNOSIS — N182 Chronic kidney disease, stage 2 (mild): Secondary | ICD-10-CM

## 2016-10-15 DIAGNOSIS — D5700 Hb-SS disease with crisis, unspecified: Principal | ICD-10-CM

## 2016-10-15 DIAGNOSIS — D638 Anemia in other chronic diseases classified elsewhere: Secondary | ICD-10-CM

## 2016-10-15 DIAGNOSIS — R51 Headache: Secondary | ICD-10-CM

## 2016-10-15 LAB — COMPREHENSIVE METABOLIC PANEL
ALBUMIN: 4.1 g/dL (ref 3.5–5.0)
ALT: 64 U/L — ABNORMAL HIGH (ref 17–63)
ANION GAP: 6 (ref 5–15)
AST: 90 U/L — AB (ref 15–41)
Alkaline Phosphatase: 138 U/L — ABNORMAL HIGH (ref 38–126)
BUN: 33 mg/dL — AB (ref 6–20)
CHLORIDE: 111 mmol/L (ref 101–111)
CO2: 20 mmol/L — ABNORMAL LOW (ref 22–32)
Calcium: 9 mg/dL (ref 8.9–10.3)
Creatinine, Ser: 2.03 mg/dL — ABNORMAL HIGH (ref 0.61–1.24)
GFR calc Af Amer: 48 mL/min — ABNORMAL LOW (ref >60)
GFR, EST NON AFRICAN AMERICAN: 41 mL/min — AB (ref >60)
Glucose, Bld: 115 mg/dL — ABNORMAL HIGH (ref 65–99)
POTASSIUM: 5.6 mmol/L — AB (ref 3.5–5.1)
Sodium: 137 mmol/L (ref 135–145)
TOTAL PROTEIN: 7.8 g/dL (ref 6.5–8.1)
Total Bilirubin: 4.3 mg/dL — ABNORMAL HIGH (ref 0.3–1.2)

## 2016-10-15 LAB — BASIC METABOLIC PANEL WITH GFR
Anion gap: 7 (ref 5–15)
BUN: 25 mg/dL — ABNORMAL HIGH (ref 6–20)
CO2: 19 mmol/L — ABNORMAL LOW (ref 22–32)
Calcium: 8.7 mg/dL — ABNORMAL LOW (ref 8.9–10.3)
Chloride: 110 mmol/L (ref 101–111)
Creatinine, Ser: 1.66 mg/dL — ABNORMAL HIGH (ref 0.61–1.24)
GFR calc Af Amer: >60 mL/min (ref >60)
GFR calc non Af Amer: 53 mL/min — ABNORMAL LOW (ref >60)
Glucose, Bld: 135 mg/dL — ABNORMAL HIGH (ref 65–99)
Potassium: 4.8 mmol/L (ref 3.5–5.1)
Sodium: 136 mmol/L (ref 135–145)

## 2016-10-15 LAB — URINALYSIS, ROUTINE W REFLEX MICROSCOPIC
BILIRUBIN URINE: NEGATIVE
Bacteria, UA: NONE SEEN
GLUCOSE, UA: NEGATIVE mg/dL
KETONES UR: NEGATIVE mg/dL
LEUKOCYTES UA: NEGATIVE
Nitrite: NEGATIVE
PH: 6 (ref 5.0–8.0)
PROTEIN: 30 mg/dL — AB
RBC / HPF: NONE SEEN RBC/hpf (ref 0–5)
SQUAMOUS EPITHELIAL / LPF: NONE SEEN
Specific Gravity, Urine: 1.01 (ref 1.005–1.030)

## 2016-10-15 LAB — TSH: TSH: 5.041 u[IU]/mL — AB (ref 0.350–4.500)

## 2016-10-15 LAB — CBC
HEMATOCRIT: 22.2 % — AB (ref 39.0–52.0)
HEMOGLOBIN: 8.2 g/dL — AB (ref 13.0–17.0)
MCH: 44.1 pg — ABNORMAL HIGH (ref 26.0–34.0)
MCHC: 36.9 g/dL — ABNORMAL HIGH (ref 30.0–36.0)
MCV: 119.4 fL — AB (ref 78.0–100.0)
Platelets: 122 10*3/uL — ABNORMAL LOW (ref 150–400)
RBC: 1.86 MIL/uL — ABNORMAL LOW (ref 4.22–5.81)
RDW: 18 % — AB (ref 11.5–15.5)
WBC: 11.1 10*3/uL — AB (ref 4.0–10.5)

## 2016-10-15 LAB — VITAMIN B12: VITAMIN B 12: 194 pg/mL (ref 180–914)

## 2016-10-15 LAB — HIV ANTIBODY (ROUTINE TESTING W REFLEX): HIV SCREEN 4TH GENERATION: NONREACTIVE

## 2016-10-15 LAB — SODIUM, URINE, RANDOM: Sodium, Ur: 100 mmol/L

## 2016-10-15 MED ORDER — HYDROMORPHONE 1 MG/ML IV SOLN
INTRAVENOUS | Status: DC
  Administered 2016-10-15: 12:00:00 via INTRAVENOUS
  Administered 2016-10-15: 8 mg via INTRAVENOUS
  Administered 2016-10-16: 2 mg via INTRAVENOUS
  Administered 2016-10-16: 3.62 mg via INTRAVENOUS
  Administered 2016-10-16: 05:00:00 via INTRAVENOUS
  Administered 2016-10-16: 0 mg via INTRAVENOUS
  Administered 2016-10-16: 4.4 mg via INTRAVENOUS
  Administered 2016-10-16: 2.4 mg via INTRAVENOUS
  Administered 2016-10-17: 2.39 mg via INTRAVENOUS
  Administered 2016-10-17: 1.2 mg via INTRAVENOUS
  Administered 2016-10-17 (×3): 2.4 mg via INTRAVENOUS
  Administered 2016-10-17: 0.6 mg via INTRAVENOUS
  Administered 2016-10-18: 1.8 mg via INTRAVENOUS
  Administered 2016-10-18: 3 mg via INTRAVENOUS
  Filled 2016-10-15 (×2): qty 25

## 2016-10-15 MED ORDER — OXYCODONE HCL 5 MG PO TABS
10.0000 mg | ORAL_TABLET | Freq: Three times a day (TID) | ORAL | Status: DC
Start: 2016-10-15 — End: 2016-10-16
  Administered 2016-10-15 – 2016-10-16 (×3): 10 mg via ORAL
  Filled 2016-10-15 (×3): qty 2

## 2016-10-15 MED ORDER — SODIUM BICARBONATE 650 MG PO TABS
650.0000 mg | ORAL_TABLET | Freq: Two times a day (BID) | ORAL | Status: DC
  Administered 2016-10-15 – 2016-10-18 (×7): 650 mg via ORAL
  Filled 2016-10-15 (×7): qty 1

## 2016-10-15 MED ORDER — KETOROLAC TROMETHAMINE 30 MG/ML IJ SOLN
30.0000 mg | Freq: Once | INTRAMUSCULAR | Status: AC
  Administered 2016-10-15: 30 mg via INTRAVENOUS
  Filled 2016-10-15: qty 1

## 2016-10-15 MED ORDER — ACETAMINOPHEN 325 MG PO TABS
650.0000 mg | ORAL_TABLET | Freq: Once | ORAL | Status: AC
  Administered 2016-10-15: 650 mg via ORAL
  Filled 2016-10-15: qty 2

## 2016-10-15 MED ORDER — NORTRIPTYLINE HCL 25 MG PO CAPS
25.0000 mg | ORAL_CAPSULE | Freq: Once | ORAL | Status: AC
  Administered 2016-10-15: 25 mg via ORAL
  Filled 2016-10-15: qty 1

## 2016-10-15 MED ORDER — NORTRIPTYLINE HCL 25 MG PO CAPS
25.0000 mg | ORAL_CAPSULE | Freq: Once | ORAL | Status: DC | PRN
Start: 2016-10-15 — End: 2016-10-18
  Filled 2016-10-15 (×2): qty 1

## 2016-10-15 NOTE — Progress Notes (Signed)
Pt agitated and verbally aggressive. Refuses to wear nasal cannula; educated on purpose and need. Minimally interactive during morning assessment; will not make eye contact. C/O migraine that is described as being typical for him and throbbing in nature. Pt states that he takes nortriptyline at home for migraines; two doses given early this AM. Reports continued back pain. Encouraged to use PCA for pain control; pt became increasingly agitated stating "that won't help my body don't work like everybody else's body." Attempted to educate patient on pain medication including how and when to use PCA or ask for additional medication. Pt non-compliant with telemetry leads. Educated on need for monitoring heart rate and rhythm. Pt states he is "itchy" and leads come off. Offered benadryl; pt declined. Will continue to monitor patient closely and provide education.

## 2016-10-15 NOTE — Progress Notes (Signed)
SICKLE CELL SERVICE PROGRESS NOTE  [name] [name]. OHY:[id number] DOB: 1980-12-27 DOA: 10/14/2016 PCP: [name], MD  Assessment/Plan: Principal Problem:   Sickle cell crisis ([place]) Active Problems:   Hyperkalemia   Anemia of chronic disease   ARF (acute renal failure) (HCC)   Hyponatremia  1. Hb SS with Crisis: Will adjust PCA to bolus of 0.6 mg , lockout 10 minutes and 1 hour total of 3.6 mg. Unable to receive NSAID due to abnormal renal function and AKI. 2. Hyperkalemia: PT received Kayexalate last night but has not had a BM since receiving it. However he has had fluids and calcium gluconate. Will re-check labs now and make further decisions once results reviewed.  3. Acute on CKD II: Likely secondary to dehydrated state. He is under care of Dr. Posey Pronto from Nephrology. Will continue hydration and if no improvement in renal function will ask nephrology to see him.  4. Acute on Chronic HA: Pt has a HA characteristic of his chronic HA. He has had a CVA in the past and is not a candidate for vaso-constrictive medications to abort HA. Will give a dose of Nortriptyline now and if no improvement in HA will ask Neurology to see.  5. Chronic Metabolic Acidosis: Resume oral bicarbonate 6. Anemia of Chronic Disease: Hb at baseline.  7. Chronic Pain Syndrome: Pt takes Oxycodone daily every 4 hours. Will schedule as he takes as a chronic medication daily. However due to AKI will schedule on an every 8 hour basis.   Code Status: Full Code Family Communication: N/A Disposition Plan: Not yet ready for discharge  [place].  Pager [phone]. If 7PM-7AM, please contact night-coverage.  10/15/2016, 11:34 AM  LOS: 1 day   Interim History: Pt states that his 1st symptom yesterday was a HA which occurred while he was moving and lifting in the sun yesterday. He then went into a cool setting to cool off and this apparently triggered a crisis. Pt states that he increased his medications  as directed by his Primary Physician without any improvement in pain. He is under the care of Dr. Posey Pronto for CKD and is prescribed Sodium Bicarbonate.  Currently he is having pain in his legs and back at intensity of 10/10. He also has a HA in the bi-temporal region which he states is characteristic of his usual HA's.    Consultants:  None  Procedures:  None  Antibiotics:  None    Objective: Vitals:   10/15/16 0600 10/15/16 0800 10/15/16 0900 10/15/16 1000  BP: (!) 146/68 (!) 107/44 (!) 146/91 135/77  Pulse:      Resp: 18 18 13 10   Temp:  98.2 F (36.8 C)    TempSrc:  Oral    SpO2:  99% 100% 95%  Weight:       Weight change:   Intake/Output Summary (Last 24 hours) at 10/15/16 1134 Last data filed at 10/15/16 0800  Gross per 24 hour  Intake          1551.67 ml  Output              550 ml  Net          1001.67 ml     Physical Exam General: Alert, awake, oriented x3, in no acute distress.  HEENT: [place]/AT PEERL, EOMI, anicteric Neck: Trachea midline,  no masses, no thyromegal,y no JVD, no carotid bruit OROPHARYNX:  Moist, No exudate/ erythema/lesions.  Heart: Regular rate and rhythm, without murmurs, rubs, gallops, PMI non-displaced,  no heaves or thrills on palpation.  Lungs: Clear to auscultation, no wheezing or rhonchi noted. No increased vocal fremitus resonant to percussion  Abdomen: Soft, nontender, nondistended, positive bowel sounds, no masses no hepatosplenomegaly noted.  Neuro: No focal neurological deficits noted cranial nerves II through XII grossly intact. Strength at baseline  in bilateral upper and lower extremities. Musculoskeletal: No warmth swelling or erythema around joints, no spinal tenderness noted. Psychiatric: Patient alert and oriented x3, good insight and cognition, good recent to remote recall.     Data Reviewed: Basic Metabolic Panel:  Recent Labs Lab 10/14/16 1448 10/14/16 1907 10/15/16 0013  NA 134* 135 137  K 6.1* >7.5* 5.6*  CL  107 110 111  CO2 18* 19* 20*  GLUCOSE 166* 99 115*  BUN 32* 35* 33*  CREATININE 2.10* 2.12* 2.03*  CALCIUM 8.9 8.7* 9.0   Liver Function Tests:  Recent Labs Lab 10/14/16 1448 10/15/16 0013  AST 99* 90*  ALT 78* 64*  ALKPHOS 149* 138*  BILITOT 5.1* 4.3*  PROT 8.2* 7.8  ALBUMIN 4.5 4.1   No results for input(s): LIPASE, AMYLASE in the last 168 hours. No results for input(s): AMMONIA in the last 168 hours. CBC:  Recent Labs Lab 10/14/16 1448 10/15/16 0013  WBC 13.8* 11.1*  NEUTROABS 10.6*  --   HGB 9.5* 8.2*  HCT 25.7* 22.2*  MCV 119.5* 119.4*  PLT 188 122*   Cardiac Enzymes: No results for input(s): CKTOTAL, CKMB, CKMBINDEX, TROPONINI in the last 168 hours. BNP (last 3 results) No results for input(s): BNP in the last 8760 hours.  ProBNP (last 3 results) No results for input(s): PROBNP in the last 8760 hours.  CBG: No results for input(s): GLUCAP in the last 168 hours.  Recent Results (from the past 240 hour(s))  MRSA PCR Screening     Status: None   Collection Time: 10/14/16  9:52 PM  Result Value Ref Range Status   MRSA by PCR NEGATIVE NEGATIVE Final    Comment:        The GeneXpert MRSA Assay (FDA approved for NASAL specimens only), is one component of a comprehensive MRSA colonization surveillance program. It is not intended to diagnose MRSA infection nor to guide or monitor treatment for MRSA infections.      Studies: Dg Chest 2 View  Result Date: 10/14/2016 CLINICAL DATA:  Sickle-cell crisis, chest pain, shortness of breath, tachycardia EXAM: CHEST  2 VIEW COMPARISON:  03/11/2015 FINDINGS: Normal heart size. Stable chronic bronchitic change and mild interstitial prominence without superimposed pneumonia, collapse or consolidation. Negative for edema, effusion or pneumothorax. Trachea is midline. Remote cholecystectomy noted. Stable osseous changes of chronic sickle-cell disease. IMPRESSION: Stable chest exam.  No superimposed acute process.  Electronically Signed   By: Jerilynn Mages.  Shick M.D.   On: 10/14/2016 15:43   [place] Retroperitoneal (renal,aorta,ivc Nodes)  Result Date: 10/15/2016 CLINICAL DATA:  Acute renal failure EXAM: RENAL / URINARY TRACT ULTRASOUND COMPLETE COMPARISON:  03/24/2014 FINDINGS: Right Kidney: Length: 9.2 cm. Isoechoic compared to the adjacent liver. No focal lesion. No hydronephrosis. Left Kidney: Length: 8.6 cm. Mild diffuse atrophy. No focal lesion or hydronephrosis. Bladder: Appears normal for degree of bladder distention. A right ureteral jet was demonstrated. IMPRESSION: 1. No hydronephrosis. 2. Small bilateral kidneys without focal lesion. Electronically Signed   By: Lucrezia Europe M.D.   On: 10/15/2016 08:34    Scheduled Meds: . enoxaparin (LOVENOX) injection  40 mg Subcutaneous QHS  . HYDROmorphone   Intravenous Q4H  .  hydroxyurea  1,500 mg Oral Daily  . nortriptyline  25 mg Oral QHS  . nortriptyline  25 mg Oral Once  . senna-docusate  1 tablet Oral BID  . sodium bicarbonate  650 mg Oral BID   Continuous Infusions: . dextrose 5 % and 0.45% NaCl 175 mL/hr at 10/15/16 0719  . diphenhydrAMINE (BENADRYL) IVPB(SICKLE CELL ONLY)      Principal Problem:   Sickle cell crisis (HCC) Active Problems:   Hyperkalemia   Anemia of chronic disease   ARF (acute renal failure) (HCC)   Hyponatremia     In excess of 35 minutes spent during this visit. Greater than 50% involved face to face contact with the patient for assessment, counseling and coordination of care.

## 2016-10-16 ENCOUNTER — Inpatient Hospital Stay (HOSPITAL_COMMUNITY): Payer: Medicare Other

## 2016-10-16 DIAGNOSIS — R0902 Hypoxemia: Secondary | ICD-10-CM

## 2016-10-16 LAB — BASIC METABOLIC PANEL
ANION GAP: 10 (ref 5–15)
BUN: 17 mg/dL (ref 6–20)
CHLORIDE: 111 mmol/L (ref 101–111)
CO2: 14 mmol/L — ABNORMAL LOW (ref 22–32)
Calcium: 8.5 mg/dL — ABNORMAL LOW (ref 8.9–10.3)
Creatinine, Ser: 1.34 mg/dL — ABNORMAL HIGH (ref 0.61–1.24)
GFR calc non Af Amer: >60 mL/min (ref >60)
Glucose, Bld: 108 mg/dL — ABNORMAL HIGH (ref 65–99)
POTASSIUM: 5.2 mmol/L — AB (ref 3.5–5.1)
SODIUM: 135 mmol/L (ref 135–145)

## 2016-10-16 LAB — CBC WITH DIFFERENTIAL/PLATELET
BASOS ABS: 0 10*3/uL (ref 0.0–0.1)
BASOS PCT: 0 %
Eosinophils Absolute: 0 10*3/uL (ref 0.0–0.7)
Eosinophils Relative: 0 %
HEMATOCRIT: 22.2 % — AB (ref 39.0–52.0)
HEMOGLOBIN: 8.4 g/dL — AB (ref 13.0–17.0)
LYMPHS PCT: 23 %
Lymphs Abs: 2.8 10*3/uL (ref 0.7–4.0)
MCH: 44.2 pg — ABNORMAL HIGH (ref 26.0–34.0)
MCHC: 37.8 g/dL — AB (ref 30.0–36.0)
MCV: 116.8 fL — ABNORMAL HIGH (ref 78.0–100.0)
MONOS PCT: 11 %
Monocytes Absolute: 1.4 10*3/uL — ABNORMAL HIGH (ref 0.1–1.0)
NEUTROS ABS: 8.1 10*3/uL — AB (ref 1.7–7.7)
NEUTROS PCT: 66 %
Platelets: 147 10*3/uL — ABNORMAL LOW (ref 150–400)
RBC: 1.9 MIL/uL — ABNORMAL LOW (ref 4.22–5.81)
RDW: 19 % — ABNORMAL HIGH (ref 11.5–15.5)
WBC: 12.3 10*3/uL — ABNORMAL HIGH (ref 4.0–10.5)

## 2016-10-16 LAB — BLOOD GAS, ARTERIAL
Acid-base deficit: 6.6 mmol/L — ABNORMAL HIGH (ref 0.0–2.0)
Bicarbonate: 18.1 mmol/L — ABNORMAL LOW (ref 20.0–28.0)
DRAWN BY: 441261
O2 CONTENT: 3 L/min
O2 SAT: 99.4 %
PATIENT TEMPERATURE: 98.6
pCO2 arterial: 35.2 mmHg (ref 32.0–48.0)
pH, Arterial: 7.332 — ABNORMAL LOW (ref 7.350–7.450)
pO2, Arterial: 136 mmHg — ABNORMAL HIGH (ref 83.0–108.0)

## 2016-10-16 LAB — CALCIUM / CREATININE RATIO, URINE
CREATININE, UR: 47.6 mg/dL
Calcium, Ur: 2 mg/dL
Calcium/Creat.Ratio: 42 mg/g creat (ref 0–260)

## 2016-10-16 LAB — RETICULOCYTES
RBC.: 1.9 MIL/uL — ABNORMAL LOW (ref 4.22–5.81)
Retic Count, Absolute: 190 10*3/uL — ABNORMAL HIGH (ref 19.0–186.0)
Retic Ct Pct: 10 % — ABNORMAL HIGH (ref 0.4–3.1)

## 2016-10-16 LAB — TYPE AND SCREEN
ABO/RH(D): O POS
ANTIBODY SCREEN: NEGATIVE

## 2016-10-16 LAB — CHLORIDE, URINE, RANDOM: Chloride Urine: 77 mmol/L

## 2016-10-16 MED ORDER — SODIUM BICARBONATE 8.4 % IV SOLN
INTRAVENOUS | Status: DC
  Administered 2016-10-16: 12:00:00 via INTRAVENOUS
  Filled 2016-10-16 (×6): qty 1000

## 2016-10-16 MED ORDER — ACETAMINOPHEN 325 MG PO TABS
650.0000 mg | ORAL_TABLET | Freq: Four times a day (QID) | ORAL | Status: DC | PRN
  Administered 2016-10-16: 650 mg via ORAL
  Filled 2016-10-16: qty 2

## 2016-10-16 MED ORDER — SODIUM CHLORIDE 0.9 % IV SOLN: Freq: Once | INTRAVENOUS | Status: DC

## 2016-10-16 MED ORDER — OXYCODONE HCL 5 MG PO TABS
10.0000 mg | ORAL_TABLET | Freq: Four times a day (QID) | ORAL | Status: DC
  Administered 2016-10-16 – 2016-10-18 (×6): 10 mg via ORAL
  Filled 2016-10-16 (×6): qty 2

## 2016-10-16 MED ORDER — SODIUM CHLORIDE 0.9 % IV BOLUS (SEPSIS)
1000.0000 mL | Freq: Once | INTRAVENOUS | Status: AC
  Administered 2016-10-16: 1000 mL via INTRAVENOUS

## 2016-10-16 MED ORDER — HYDROMORPHONE HCL 2 MG/ML IJ SOLN
2.0000 mg | INTRAMUSCULAR | Status: AC
  Administered 2016-10-16 (×2): 2 mg via INTRAVENOUS
  Filled 2016-10-16: qty 1

## 2016-10-16 NOTE — Progress Notes (Signed)
Pt tachypnec & tachycardic, dim Left lung field; paged DR; new order rec'd for CXR & blood gas. Both completed.

## 2016-10-16 NOTE — Progress Notes (Signed)
Called by bedside RN to evaluate patient prior to Dr Zigmund Daniel arrival. Upon arrival patient laying on right side, soundly sleeping. RR 24, no distress noted. Awakened to assess respiratory status. Patient with scant Rhonchi bilaterally, otherwise resting without c/o. PCXR and ABG's ordered. Respiratory aware. Informed bedside RN to call if further needed.

## 2016-10-16 NOTE — Progress Notes (Signed)
SICKLE CELL SERVICE PROGRESS NOTE  [name] [name]. IRS:[id number] DOB: 1980-10-13 DOA: 10/14/2016 PCP: [name], MD  Assessment/Plan: Principal Problem:   Sickle cell crisis ([place]) Active Problems:   Hyperkalemia   Anemia of chronic disease   ARF (acute renal failure) (HCC)   Hyponatremia  1. Metabolic Acidosis: Pt likely has an RTA with elevated potassium. Even with elevated respirations he is unable to effectively compensate for metabolic acidosis. Will place on a Bicarbonate drip and obtain Urine Anion Gap.  2. Hypoxia: This is secondary to shallow breathing. Will also obtain Hb to ensure that there is no acute anemia contributing to hypoxia. 3. Tachycardia: I will obtain a 12-lead EKG but this is likely due to pain.  4. Hb SS with Crisis: Will give a limited amount of clinician assisted doses and increase frequency of Oxycodone to every 6 hours as renal function has improved. Continue PCA at current bolus of 0.6 mg , lockout 10 minutes and 1 hour total of 3.6 mg. Unable to receive NSAID due to abnormal renal function and AKI. 5. Hyperkalemia: Potassium levels had improved yesterday but are again mildly increased. This is multifactorial and likely moist related to RTA rather than filtration. 6. Acute on CKD II: Likely secondary to dehydrated state. He is under care of Dr. Posey Pronto from Nephrology. Will continue hydration. Try yo obtain records from Nephrology. 7. Acute on Chronic HA: Improved. Continue Nortriptyline q HS. 8. Chronic Metabolic Acidosis: Changing to Bicarbonate drip. 9. Anemia of Chronic Disease: Hb at baseline yesterday. Awaiting lab results from today 10. Chronic Pain Syndrome: Pt takes Oxycodone daily every 4 hours. Will schedule as he takes as a chronic medication daily.  AKI has improved so will schedule on an every 6 hour basis.   Code Status: Full Code Family Communication: N/A Disposition Plan: Not yet ready for discharge  [place].  Pager  [phone]. If 7PM-7AM, please contact night-coverage.  10/16/2016, 11:09 AM  LOS: 2 days   Interim History: Pt states that his pain today has been localized to back, legs and chest wall. He rates his pain as "real bad". He has used 18.8 mg of Dilaudid in the last 24 hours with 61/15:demands/deliveries in the last 24 hours. States that the pain has been so intense that as soon as he gets some relief he falls off to sleep. However upon awakening the pain si back to 10/10 again.   Consultants:  None  Procedures:  None  Antibiotics:  None    Objective: Vitals:   10/16/16 0450 10/16/16 0534 10/16/16 0827 10/16/16 1028  BP:  131/78  131/77  Pulse:  (!) 123  (!) 123  Resp: 20 10 (!) 22 19  Temp:  99.5 F (37.5 C)  (!) 100.4 F (38 C)  TempSrc:  Oral  Oral  SpO2: 98% 98% 98% 99%  Weight:      Height:       Weight change: 0 kg (0 lb)  Intake/Output Summary (Last 24 hours) at 10/16/16 1109 Last data filed at 10/16/16 1029  Gross per 24 hour  Intake          3093.96 ml  Output             2600 ml  Net           493.96 ml     Physical Exam General: Alert, awake, oriented x3, in visible distress due to pain.  HEENT: [place]/AT PEERL, EOMI, anicteric Neck: Trachea midline,  no masses,  no thyromegal,y no JVD, no carotid bruit OROPHARYNX:  Moist, No exudate/ erythema/lesions.  Heart: Regular rate and rhythm, without murmurs, rubs, gallops, PMI non-displaced, no heaves or thrills on palpation.  Lungs: Clear to auscultation, no wheezing or rhonchi noted. No increased vocal fremitus resonant to percussion  Abdomen: Soft, nontender, nondistended, positive bowel sounds, no masses no hepatosplenomegaly noted.  Neuro: No focal neurological deficits noted cranial nerves II through XII grossly intact. Strength at baseline  in bilateral upper and lower extremities. Musculoskeletal: No warmth swelling or erythema around joints, no spinal tenderness noted. Psychiatric: Patient alert and oriented  x3, good insight and cognition, good recent to remote recall.     Data Reviewed: Basic Metabolic Panel:  Recent Labs Lab 10/14/16 1448 10/14/16 1907 10/15/16 0013 10/15/16 1247 10/16/16 0322  NA 134* 135 137 136 135  K 6.1* >7.5* 5.6* 4.8 5.2*  CL 107 110 111 110 111  CO2 18* 19* 20* 19* 14*  GLUCOSE 166* 99 115* 135* 108*  BUN 32* 35* 33* 25* 17  CREATININE 2.10* 2.12* 2.03* 1.66* 1.34*  CALCIUM 8.9 8.7* 9.0 8.7* 8.5*   Liver Function Tests:  Recent Labs Lab 10/14/16 1448 10/15/16 0013  AST 99* 90*  ALT 78* 64*  ALKPHOS 149* 138*  BILITOT 5.1* 4.3*  PROT 8.2* 7.8  ALBUMIN 4.5 4.1   No results for input(s): LIPASE, AMYLASE in the last 168 hours. No results for input(s): AMMONIA in the last 168 hours. CBC:  Recent Labs Lab 10/14/16 1448 10/15/16 0013  WBC 13.8* 11.1*  NEUTROABS 10.6*  --   HGB 9.5* 8.2*  HCT 25.7* 22.2*  MCV 119.5* 119.4*  PLT 188 122*   Cardiac Enzymes: No results for input(s): CKTOTAL, CKMB, CKMBINDEX, TROPONINI in the last 168 hours. BNP (last 3 results) No results for input(s): BNP in the last 8760 hours.  ProBNP (last 3 results) No results for input(s): PROBNP in the last 8760 hours.  CBG: No results for input(s): GLUCAP in the last 168 hours.  Recent Results (from the past 240 hour(s))  MRSA PCR Screening     Status: None   Collection Time: 10/14/16  9:52 PM  Result Value Ref Range Status   MRSA by PCR NEGATIVE NEGATIVE Final    Comment:        The GeneXpert MRSA Assay (FDA approved for NASAL specimens only), is one component of a comprehensive MRSA colonization surveillance program. It is not intended to diagnose MRSA infection nor to guide or monitor treatment for MRSA infections.      Studies: Dg Chest 2 View  Result Date: 10/16/2016 CLINICAL DATA:  Chest pain. Bilateral leg pain. History of sickle cell anemia EXAM: CHEST  2 VIEW COMPARISON:  10/14/2016 FINDINGS: Chronic avascular necrosis of the right humeral  head. Faint interstitial accentuation, chronic. Cardiac and mediastinal margins appear normal. No airspace opacity identified. IMPRESSION: 1. No acute findings 2. Mild chronic interstitial accentuation. 3. Chronic avascular necrosis of the right humeral head. Electronically Signed   By: Van Clines M.D.   On: 10/16/2016 10:05   Dg Chest 2 View  Result Date: 10/14/2016 CLINICAL DATA:  Sickle-cell crisis, chest pain, shortness of breath, tachycardia EXAM: CHEST  2 VIEW COMPARISON:  03/11/2015 FINDINGS: Normal heart size. Stable chronic bronchitic change and mild interstitial prominence without superimposed pneumonia, collapse or consolidation. Negative for edema, effusion or pneumothorax. Trachea is midline. Remote cholecystectomy noted. Stable osseous changes of chronic sickle-cell disease. IMPRESSION: Stable chest exam.  No superimposed acute process.  Electronically Signed   By: Jerilynn Mages.  Shick M.D.   On: 10/14/2016 15:43   [place] Retroperitoneal (renal,aorta,ivc Nodes)  Result Date: 10/15/2016 CLINICAL DATA:  Acute renal failure EXAM: RENAL / URINARY TRACT ULTRASOUND COMPLETE COMPARISON:  03/24/2014 FINDINGS: Right Kidney: Length: 9.2 cm. Isoechoic compared to the adjacent liver. No focal lesion. No hydronephrosis. Left Kidney: Length: 8.6 cm. Mild diffuse atrophy. No focal lesion or hydronephrosis. Bladder: Appears normal for degree of bladder distention. A right ureteral jet was demonstrated. IMPRESSION: 1. No hydronephrosis. 2. Small bilateral kidneys without focal lesion. Electronically Signed   By: Lucrezia Europe M.D.   On: 10/15/2016 08:34    Scheduled Meds: . enoxaparin (LOVENOX) injection  40 mg Subcutaneous QHS  . HYDROmorphone   Intravenous Q4H  .  HYDROmorphone (DILAUDID) injection  2 mg Intravenous Q3H  . hydroxyurea  1,500 mg Oral Daily  . nortriptyline  25 mg Oral QHS  . oxyCODONE  10 mg Oral Q6H  . senna-docusate  1 tablet Oral BID  . sodium bicarbonate  650 mg Oral BID   Continuous  Infusions: . sodium chloride    . dextrose 5 % and 0.45% NaCl 10 mL (10/16/16 1031)  . diphenhydrAMINE (BENADRYL) IVPB(SICKLE CELL ONLY)      Principal Problem:   Sickle cell crisis (HCC) Active Problems:   Hyperkalemia   Anemia of chronic disease   ARF (acute renal failure) (HCC)   Hyponatremia     In excess of 40 minutes spent during this visit. Greater than 50% involved face to face contact with the patient for assessment, counseling and coordination of care.

## 2016-10-16 NOTE — Progress Notes (Signed)
Patient ID: [name] [name]., male   DOB: March 08, 1980, 36 y.o.   MRN: [id number]  Received call from nurse reporting that patient with increased respirations, Hypoxia and tachycardia. Subsequent vital signs reported: T 99.2, BP 136/95, HR 128, Sat 99% 3.5 L/min.   Ordered ABG and 2-V CXR and Rapid response evaluation until I could arrive at the bedside. Will follow up when finished with current patient.  MATTHEWS,[name]

## 2016-10-17 LAB — CBC WITH DIFFERENTIAL/PLATELET
Basophils Absolute: 0 10*3/uL (ref 0.0–0.1)
Basophils Relative: 0 %
EOS ABS: 0 10*3/uL (ref 0.0–0.7)
Eosinophils Relative: 0 %
HCT: 25.4 % — ABNORMAL LOW (ref 39.0–52.0)
Hemoglobin: 9.6 g/dL — ABNORMAL LOW (ref 13.0–17.0)
LYMPHS ABS: 3.8 10*3/uL (ref 0.7–4.0)
Lymphocytes Relative: 30 %
MCH: 44 pg — AB (ref 26.0–34.0)
MCHC: 37.8 g/dL — AB (ref 30.0–36.0)
MCV: 116.5 fL — ABNORMAL HIGH (ref 78.0–100.0)
MONO ABS: 0.8 10*3/uL (ref 0.1–1.0)
Monocytes Relative: 6 %
NEUTROS ABS: 8.1 10*3/uL — AB (ref 1.7–7.7)
Neutrophils Relative %: 64 %
PLATELETS: 128 10*3/uL — AB (ref 150–400)
RBC: 2.18 MIL/uL — ABNORMAL LOW (ref 4.22–5.81)
RDW: 18.7 % — ABNORMAL HIGH (ref 11.5–15.5)
WBC: 12.7 10*3/uL — ABNORMAL HIGH (ref 4.0–10.5)

## 2016-10-17 LAB — RETICULOCYTES
RBC.: 2.18 MIL/uL — ABNORMAL LOW (ref 4.22–5.81)
RETIC CT PCT: 13 % — AB (ref 0.4–3.1)
Retic Count, Absolute: 283.4 10*3/uL — ABNORMAL HIGH (ref 19.0–186.0)

## 2016-10-17 LAB — BASIC METABOLIC PANEL
Anion gap: 9 (ref 5–15)
BUN: 15 mg/dL (ref 6–20)
CHLORIDE: 99 mmol/L — AB (ref 101–111)
CO2: 25 mmol/L (ref 22–32)
CREATININE: 1.22 mg/dL (ref 0.61–1.24)
Calcium: 9.1 mg/dL (ref 8.9–10.3)
GFR calc non Af Amer: >60 mL/min (ref >60)
Glucose, Bld: 98 mg/dL (ref 65–99)
Potassium: 4.3 mmol/L (ref 3.5–5.1)
Sodium: 133 mmol/L — ABNORMAL LOW (ref 135–145)

## 2016-10-17 LAB — NA AND K (SODIUM & POTASSIUM), RAND UR
Potassium Urine: 23 mmol/L
SODIUM UR: 91 mmol/L

## 2016-10-17 LAB — FOLATE RBC
FOLATE, RBC: 1005 ng/mL (ref >498)
Folate, Hemolysate: 236.2 ng/mL
HEMATOCRIT: 23.5 % — AB (ref 37.5–51.0)

## 2016-10-17 MED ORDER — HYDROMORPHONE HCL-NACL 0.5-0.9 MG/ML-% IV SOSY
1.0000 mg | PREFILLED_SYRINGE | Freq: Once | INTRAVENOUS | Status: AC
  Administered 2016-10-17: 1 mg via SUBCUTANEOUS
  Filled 2016-10-17: qty 2

## 2016-10-17 MED ORDER — SODIUM BICARBONATE 8.4 % IV SOLN
INTRAVENOUS | Status: DC
  Administered 2016-10-17: 22:00:00 via INTRAVENOUS
  Filled 2016-10-17 (×3): qty 850

## 2016-10-17 NOTE — Progress Notes (Signed)
SICKLE CELL SERVICE PROGRESS NOTE  [name] [name]. ZOX:[id number] DOB: 08/08/1980 DOA: 10/14/2016 PCP: [name] Hey, MD  Assessment/Plan: Principal Problem:   Sickle cell crisis ([place]) Active Problems:   Hyperkalemia   Anemia of chronic disease   ARF (acute renal failure) (HCC)   Hyponatremia  1. Metabolic Acidosis: Improved. Tachycardia: I will obtain a 12-lead EKG but this is likely due to pain.  2. Hb SS with Crisis: Will increase frequency of Oxycodone to every 4 hours as renal function has improved. Continue PCA at current bolus of 0.6 mg , lockout 10 minutes and 1 hour total of 3.6 mg. Unable to receive NSAID due to abnormal renal function and AKI. 3. Hyperkalemia: Potassium levels have improved and ae now WNL's.  4. Acute on CKD II: Secondary to dehydrated state and is markedly improved after hydration.  5.  Chronic HA: Continue Nortriptyline q HS. 6. Chronic Metabolic Acidosis:Improved after starting Bicarbonate drip. 7. Anemia of Chronic Disease: Hb remains at baseline. 8. Chronic Pain Syndrome: Pt takes Oxycodone daily every 4 hours. Will schedule as he takes as a chronic medication daily.  AKI has improved so will schedule on an every 4 hour basis.   Code Status: Full Code Family Communication: N/A Disposition Plan: Anticipate discharge in next 1-2 days  [name] A.  Pager [phone]. If 7PM-7AM, please contact night-coverage.  10/17/2016, 12:54 PM  LOS: 3 days   Interim History: Pt states that his pain today has improved and is localized to back, legs and chest wall. He rates his pain as 5-6/10. He has used 12.8 mg of Dilaudid in the last 24 hours .   Consultants:  None  Procedures:  None  Antibiotics:  None    Objective: Vitals:   10/17/16 0213 10/17/16 0345 10/17/16 0526 10/17/16 0803  BP: 105/70  125/81   Pulse: (!) 122  (!) 131   Resp: 14 16 16 12   Temp: 99.2 F (37.3 C)  100.1 F (37.8 C)   TempSrc: Oral  Oral   SpO2: 93% 92%  100% 93%  Weight:      Height:       Weight change:   Intake/Output Summary (Last 24 hours) at 10/17/16 1254 Last data filed at 10/16/16 2120  Gross per 24 hour  Intake                0 ml  Output             1300 ml  Net            -1300 ml     Physical Exam General: Alert, awake, oriented x3, in mild distress due to pain.  HEENT: [place]/AT PEERL, EOMI, anicteric Neck: Trachea midline,  no masses, no thyromegal,y no JVD, no carotid bruit OROPHARYNX:  Moist, No exudate/ erythema/lesions.  Heart: Regular rate and rhythm, II/VI non-radiating SEM at base. No rubs or gallops, PMI non-displaced, no heaves or thrills on palpation.  Lungs: Clear to auscultation, no wheezing or rhonchi noted. No increased vocal fremitus resonant to percussion  Abdomen: Soft, nontender, nondistended, positive bowel sounds, no masses no hepatosplenomegaly noted.  Neuro: No focal neurological deficits noted cranial nerves II through XII grossly intact. Strength at baseline  in bilateral upper and lower extremities. Musculoskeletal: No warmth swelling or erythema around joints, no spinal tenderness noted. Psychiatric: Patient alert and oriented x3, good insight and cognition, good recent to remote recall.    Data Reviewed: Basic Metabolic Panel:  Recent Labs Lab 10/14/16  1907 10/15/16 0013 10/15/16 1247 10/16/16 0322 10/17/16 0338  NA 135 137 136 135 133*  K >7.5* 5.6* 4.8 5.2* 4.3  CL 110 111 110 111 99*  CO2 19* 20* 19* 14* 25  GLUCOSE 99 115* 135* 108* 98  BUN 35* 33* 25* 17 15  CREATININE 2.12* 2.03* 1.66* 1.34* 1.22  CALCIUM 8.7* 9.0 8.7* 8.5* 9.1   Liver Function Tests:  Recent Labs Lab 10/14/16 1448 10/15/16 0013  AST 99* 90*  ALT 78* 64*  ALKPHOS 149* 138*  BILITOT 5.1* 4.3*  PROT 8.2* 7.8  ALBUMIN 4.5 4.1   No results for input(s): LIPASE, AMYLASE in the last 168 hours. No results for input(s): AMMONIA in the last 168 hours. CBC:  Recent Labs Lab 10/14/16 1448  10/15/16 0013 10/16/16 1656 10/17/16 0338  WBC 13.8* 11.1* 12.3* 12.7*  NEUTROABS 10.6*  --  8.1* 8.1*  HGB 9.5* 8.2* 8.4* 9.6*  HCT 25.7* 22.2* 22.2* 25.4*  MCV 119.5* 119.4* 116.8* 116.5*  PLT 188 122* 147* 128*   Cardiac Enzymes: No results for input(s): CKTOTAL, CKMB, CKMBINDEX, TROPONINI in the last 168 hours. BNP (last 3 results) No results for input(s): BNP in the last 8760 hours.  ProBNP (last 3 results) No results for input(s): PROBNP in the last 8760 hours.  CBG: No results for input(s): GLUCAP in the last 168 hours.  Recent Results (from the past 240 hour(s))  MRSA PCR Screening     Status: None   Collection Time: 10/14/16  9:52 PM  Result Value Ref Range Status   MRSA by PCR NEGATIVE NEGATIVE Final    Comment:        The GeneXpert MRSA Assay (FDA approved for NASAL specimens only), is one component of a comprehensive MRSA colonization surveillance program. It is not intended to diagnose MRSA infection nor to guide or monitor treatment for MRSA infections.      Studies: Dg Chest 2 View  Result Date: 10/16/2016 CLINICAL DATA:  Chest pain. Bilateral leg pain. History of sickle cell anemia EXAM: CHEST  2 VIEW COMPARISON:  10/14/2016 FINDINGS: Chronic avascular necrosis of the right humeral head. Faint interstitial accentuation, chronic. Cardiac and mediastinal margins appear normal. No airspace opacity identified. IMPRESSION: 1. No acute findings 2. Mild chronic interstitial accentuation. 3. Chronic avascular necrosis of the right humeral head. Electronically Signed   By: Van Clines M.D.   On: 10/16/2016 10:05   Dg Chest 2 View  Result Date: 10/14/2016 CLINICAL DATA:  Sickle-cell crisis, chest pain, shortness of breath, tachycardia EXAM: CHEST  2 VIEW COMPARISON:  03/11/2015 FINDINGS: Normal heart size. Stable chronic bronchitic change and mild interstitial prominence without superimposed pneumonia, collapse or consolidation. Negative for edema, effusion  or pneumothorax. Trachea is midline. Remote cholecystectomy noted. Stable osseous changes of chronic sickle-cell disease. IMPRESSION: Stable chest exam.  No superimposed acute process. Electronically Signed   By: Jerilynn Mages.  Shick M.D.   On: 10/14/2016 15:43   [place] Retroperitoneal (renal,aorta,ivc Nodes)  Result Date: 10/15/2016 CLINICAL DATA:  Acute renal failure EXAM: RENAL / URINARY TRACT ULTRASOUND COMPLETE COMPARISON:  03/24/2014 FINDINGS: Right Kidney: Length: 9.2 cm. Isoechoic compared to the adjacent liver. No focal lesion. No hydronephrosis. Left Kidney: Length: 8.6 cm. Mild diffuse atrophy. No focal lesion or hydronephrosis. Bladder: Appears normal for degree of bladder distention. A right ureteral jet was demonstrated. IMPRESSION: 1. No hydronephrosis. 2. Small bilateral kidneys without focal lesion. Electronically Signed   By: Lucrezia Europe M.D.   On: 10/15/2016 08:34  Scheduled Meds: . enoxaparin (LOVENOX) injection  40 mg Subcutaneous QHS  . HYDROmorphone   Intravenous Q4H  . hydroxyurea  1,500 mg Oral Daily  . nortriptyline  25 mg Oral QHS  . oxyCODONE  10 mg Oral Q6H  . senna-docusate  1 tablet Oral BID  . sodium bicarbonate  650 mg Oral BID   Continuous Infusions: . dextrose 5 % 1,000 mL with sodium bicarbonate 150 mEq infusion 100 mL/hr at 10/16/16 1156  . diphenhydrAMINE (BENADRYL) IVPB(SICKLE CELL ONLY)      Principal Problem:   Sickle cell crisis (HCC) Active Problems:   Hyperkalemia   Anemia of chronic disease   ARF (acute renal failure) (HCC)   Hyponatremia     In excess of 25 minutes spent during this visit. Greater than 50% involved face to face contact with the patient for assessment, counseling and coordination of care.

## 2016-10-17 NOTE — Care Management Note (Signed)
Case Management Note  Patient Details  Name: [name] [name]. MRN: [id number] Date of Birth: 05-01-1980  Subjective/Objective:     36 yo admitted with Sickle Cell Crisis.               Action/Plan: From Home.  Expected Discharge Date:  10/17/16               Expected Discharge Plan:  Home/Self Care  In-House Referral:     Discharge planning Services  CM Consult  Post Acute Care Choice:    Choice offered to:     DME Arranged:    DME Agency:     HH Arranged:    HH Agency:     Status of Service:  In process, will continue to follow  If discussed at Long Length of Stay Meetings, dates discussed:    Additional CommentsLynnell Catalan, RN 10/17/2016, 3:31 PM  [phone]

## 2016-10-18 LAB — MAGNESIUM: Magnesium: 1.7 mg/dL (ref 1.7–2.4)

## 2016-10-18 LAB — BASIC METABOLIC PANEL
ANION GAP: 9 (ref 5–15)
BUN: 15 mg/dL (ref 6–20)
CO2: 29 mmol/L (ref 22–32)
Calcium: 8.9 mg/dL (ref 8.9–10.3)
Chloride: 96 mmol/L — ABNORMAL LOW (ref 101–111)
Creatinine, Ser: 1.09 mg/dL (ref 0.61–1.24)
GFR calc Af Amer: >60 mL/min (ref >60)
GFR calc non Af Amer: >60 mL/min (ref >60)
GLUCOSE: 105 mg/dL — AB (ref 65–99)
POTASSIUM: 3.3 mmol/L — AB (ref 3.5–5.1)
Sodium: 134 mmol/L — ABNORMAL LOW (ref 135–145)

## 2016-10-18 MED ORDER — POTASSIUM CHLORIDE CRYS ER 20 MEQ PO TBCR: 40.0000 meq | EXTENDED_RELEASE_TABLET | Freq: Once | ORAL | Status: DC

## 2016-10-18 MED ORDER — LIP MEDEX EX OINT
TOPICAL_OINTMENT | CUTANEOUS | Status: AC
  Administered 2016-10-18: 05:00:00
  Filled 2016-10-18: qty 7

## 2016-10-18 NOTE — Discharge Summary (Signed)
[name] A Strough Jr. MRN: [id number] DOB/AGE: [birth year] 34 y.o.  Admit date: 10/14/2016 Discharge date: 10/18/2016  Primary Care Physician:  Billee Cashing, MD   Discharge Diagnoses:   Patient Active Problem List   Diagnosis Date Noted  . Sickle cell crisis (HCC) 10/14/2016  . ARF (acute renal failure) (HCC) 10/14/2016  . Hyponatremia 10/14/2016  . Tachycardia with heart rate 121-140 beats per minute with ambulation 08/04/2016  . Normal anion gap metabolic acidosis 08/04/2016  . Anemia of chronic disease 08/02/2016  . Macrocytosis due to Hydroxyurea 08/02/2016  . Hyperkalemia 09/26/2012  . Chronic, continuous use of opioids 08/30/2012  . Chronic headaches 07/10/2012  . Gynecomastia, male 07/10/2012  . Sickle cell nephropathy (HCC) 07/10/2012  . Systolic murmur 12/08/2011  . Migraine 11/26/2009  . CHRONIC KIDNEY DISEASE STAGE II (MILD) 03/06/2009  . Sickle cell disease, type SS.  06/18/2008  . TOBACCO ABUSE 05/22/2007    DISCHARGE MEDICATION: Allergies as of 10/18/2016      Reactions   Morphine And Related Other (See Comments)   "real bad headaches"      Medication List    STOP taking these medications   naproxen sodium 220 MG tablet Commonly known as:  ANAPROX     TAKE these medications   hydroxyurea 500 MG capsule Commonly known as:  HYDREA TAKE 3 CAPSULES (1,500 MG TOTAL) BY MOUTH DAILY.   nortriptyline 25 MG capsule Commonly known as:  PAMELOR TAKE 1 CAPSULE (25 MG TOTAL) BY MOUTH AT BEDTIME.   Oxycodone HCl 10 MG Tabs Take 10 mg by mouth every 6 (six) hours as needed.   sodium bicarbonate 650 MG tablet Take 1 tablet (650 mg total) by mouth 2 (two) times daily.            Discharge Care Instructions        Start     Ordered   10/18/16 0000  Activity as tolerated - No restrictions     10/18/16 1203   10/18/16 0000  Diet general     10/18/16 1203        Consults:    SIGNIFICANT DIAGNOSTIC STUDIES:  Dg Chest 2 View  Result Date:  10/16/2016 CLINICAL DATA:  Chest pain. Bilateral leg pain. History of sickle cell anemia EXAM: CHEST  2 VIEW COMPARISON:  10/14/2016 FINDINGS: Chronic avascular necrosis of the right humeral head. Faint interstitial accentuation, chronic. Cardiac and mediastinal margins appear normal. No airspace opacity identified. IMPRESSION: 1. No acute findings 2. Mild chronic interstitial accentuation. 3. Chronic avascular necrosis of the right humeral head. Electronically Signed   By: Gaylyn Rong M.D.   On: 10/16/2016 10:05   Dg Chest 2 View  Result Date: 10/14/2016 CLINICAL DATA:  Sickle-cell crisis, chest pain, shortness of breath, tachycardia EXAM: CHEST  2 VIEW COMPARISON:  03/11/2015 FINDINGS: Normal heart size. Stable chronic bronchitic change and mild interstitial prominence without superimposed pneumonia, collapse or consolidation. Negative for edema, effusion or pneumothorax. Trachea is midline. Remote cholecystectomy noted. Stable osseous changes of chronic sickle-cell disease. IMPRESSION: Stable chest exam.  No superimposed acute process. Electronically Signed   By: Judie Petit.  Shick M.D.   On: 10/14/2016 15:43   [place] Retroperitoneal (renal,aorta,ivc Nodes)  Result Date: 10/15/2016 CLINICAL DATA:  Acute renal failure EXAM: RENAL / URINARY TRACT ULTRASOUND COMPLETE COMPARISON:  03/24/2014 FINDINGS: Right Kidney: Length: 9.2 cm. Isoechoic compared to the adjacent liver. No focal lesion. No hydronephrosis. Left Kidney: Length: 8.6 cm. Mild diffuse atrophy. No focal lesion or hydronephrosis. Bladder: Appears normal  for degree of bladder distention. A right ureteral jet was demonstrated. IMPRESSION: 1. No hydronephrosis. 2. Small bilateral kidneys without focal lesion. Electronically Signed   By: Corlis Leak M.D.   On: 10/15/2016 08:34       Recent Results (from the past 240 hour(s))  MRSA PCR Screening     Status: None   Collection Time: 10/14/16  9:52 PM  Result Value Ref Range Status   MRSA by PCR NEGATIVE  NEGATIVE Final    Comment:        The GeneXpert MRSA Assay (FDA approved for NASAL specimens only), is one component of a comprehensive MRSA colonization surveillance program. It is not intended to diagnose MRSA infection nor to guide or monitor treatment for MRSA infections.     BRIEF ADMITTING H & P: [name] [name]  is a 36 y.o. male, w sickle cell , ckd stage2, c/o back pain, arm pain.  Pt denies cp, palp, sob, lower ext. Pt states that his pain is siimillar to his sickle cell pain and has been going on for about 1 day.   In ED,  CXR negative. EKG NSR at 88, nl axis, no st-t changes consistent with ischemia.  Trop negative. Hgb 9.5, K =6.1, Na 134,  Bun/Creat 32/2.10  Pt will be admitted for sickle cell crisis, hyperkalemia, ARF on CRF, and hyponatremia.     Hospital Course:  Present on Admission: . Sickle cell crisis (HCC) . Anemia of chronic disease . Hyperkalemia  [name] is an opiate tolerant patient with hemoglobin SS who was admitted with vaso-occlusive crisis, hyperkalemia, acute on chronic kidney disease stage II, chronic metabolic acidosis and systemic inflammatory response syndrome. The patient was initially admitted to the stepdown unit due to his hyperkalemia. He received Kayexalate and IV fluids which resolved hyperkalemia initially however the patient became hyperkalemic again. After discussion with the patient and a review of records is notable the patient actually has a chronic metabolic acidosis and is supposed to be on oral bicarbonate which he has not been taking. The patient was initially placed on a bicarbonate drip as he was profoundly metabolic. This resolved both the elevated potassium and the patient's bicarbonate came into normal range. He was then placed on oral bicarbonate and was discharged on the same.  The patient also was noted to be hypoxic however this was secondary to hypoxemia associated with pain. The patient also had a tachycardia that was  associated with this. Thus the pain was managed with a Dilaudid PCA and once the patient's pain was controlled both hypoxia and tachycardia resolved along with it. At the time of discharge the patient had a normal work of breathing and did not require any supplemental oxygen.  With regard to his vaso-occlusive crisis the patient was started on a Dilaudid PCA and then weaned off and transition to oral medications. At the time of discharge the patient was without any pain and ambulatory without any assistance or any need for oxygen.  The patient does have a known chronic kidney disease stage II and is under the care of Dr. Allena Katz at [facility]. On admission his creatinine was 2.10. However this improved with IV hydration and with oral bicarbonate. At the time of discharge is creatinine was 1.09.  Disposition and Follow-up: The patient is discharged in good condition he is to follow-up with Dr. Allena Katz at nephrology within the next 1-2 weeks and with his primary care physician Dr. Ronne Binning. Discharge Instructions    Activity as  tolerated - No restrictions    Complete by:  As directed    Diet general    Complete by:  As directed       DISCHARGE EXAM:  General: Alert, awake, oriented x3, in mild distress.  HEENT: [place]/AT PEERL, EOMI, anicteric Neck: Trachea midline, no masses, no thyromegal,y no JVD, no carotid bruit OROPHARYNX: Moist, No exudate/ erythema/lesions.  Heart: Regular rate and rhythm, II/VI nonradiating systolic ejection murmur at the base of the heart.No rubs, gallops or S3. PMI non-displaced. Exam reveals no decreased pulses. Pulmonary/Chest: Normal effort. Breath sounds normal. No. Apnea. Clear to auscultation,no stridor,  no wheezing and no rhonchi noted. No respiratory distress and no tenderness noted. Abdomen: Soft, nontender, nondistended, normal bowel sounds, no masses no hepatosplenomegaly noted. No fluid wave and no ascites. There is no guarding or  rebound. Neuro: Alert and oriented to person, place and time. Normal motor skills, Displays no atrophy or tremors and exhibits normal muscle tone.  No focal neurological deficits noted cranial nerves II through XII grossly intact. No sensory deficit noted. Strength at baseline in bilateral upper and lower extremities. Gait normal. Musculoskeletal: No warmth swelling or erythema around joints, no spinal tenderness noted. Psychiatric: Patient alert and oriented x3, good insight and cognition, good recent to remote recall. Skin: Skin is warm and dry. No bruising, no ecchymosis and no rash noted. Pt is not diaphoretic. No erythema. No pallor    Blood pressure 107/65, pulse 84, temperature 98 F (36.7 C), temperature source Oral, resp. rate 16, height 5\' 5"  (1.651 m), weight 61.5 kg (135 lb 9.6 oz), SpO2 99 %.   Recent Labs  10/17/16 0338 10/18/16 1116  NA 133* 134*  K 4.3 3.3*  CL 99* 96*  CO2 25 29  GLUCOSE 98 105*  BUN 15 15  CREATININE 1.22 1.09  CALCIUM 9.1 8.9   No results for input(s): AST, ALT, ALKPHOS, BILITOT, PROT, ALBUMIN in the last 72 hours. No results for input(s): LIPASE, AMYLASE in the last 72 hours.  Recent Labs  10/16/16 1656 10/17/16 0338  WBC 12.3* 12.7*  NEUTROABS 8.1* 8.1*  HGB 8.4* 9.6*  HCT 22.2* 25.4*  MCV 116.8* 116.5*  PLT 147* 128*     Total time spent including face to face and decision making was greater than 30 minutes  Signed: Naseem Adler A. 10/18/2016, 12:03 PM

## 2016-10-18 NOTE — Care Management Important Message (Signed)
Important Message  Patient Details  Name: [name] [name]. MRN: [id number] Date of Birth: 07/30/1980   Medicare Important Message Given:  Yes    Kerin Salen 10/18/2016, 10:38 AMImportant Message  Patient Details  Name: [name] [name]. MRN: [id number] Date of Birth: Aug 06, 1980   Medicare Important Message Given:  Yes    Kerin Salen 10/18/2016, 10:38 AM

## 2016-10-26 DIAGNOSIS — Z79891 Long term (current) use of opiate analgesic: Secondary | ICD-10-CM | POA: Diagnosis not present

## 2016-11-24 DIAGNOSIS — Z79891 Long term (current) use of opiate analgesic: Secondary | ICD-10-CM | POA: Diagnosis not present

## 2016-12-13 ENCOUNTER — Emergency Department (HOSPITAL_COMMUNITY): Payer: Medicare Other

## 2016-12-13 ENCOUNTER — Encounter (HOSPITAL_COMMUNITY): Payer: Self-pay | Admitting: Emergency Medicine

## 2016-12-13 ENCOUNTER — Inpatient Hospital Stay (HOSPITAL_COMMUNITY)
Admission: EM | Admit: 2016-12-13 | Discharge: 2016-12-21 | DRG: 811 | Disposition: A | Discharge status: Home / Self Care | Payer: Medicare Other | Attending: Internal Medicine | Admitting: Internal Medicine

## 2016-12-13 DIAGNOSIS — D5700 Hb-SS disease with crisis, unspecified: Secondary | ICD-10-CM | POA: Diagnosis not present

## 2016-12-13 DIAGNOSIS — N179 Acute kidney failure, unspecified: Secondary | ICD-10-CM

## 2016-12-13 DIAGNOSIS — F1123 Opioid dependence with withdrawal: Secondary | ICD-10-CM | POA: Diagnosis present

## 2016-12-13 DIAGNOSIS — E876 Hypokalemia: Secondary | ICD-10-CM | POA: Diagnosis not present

## 2016-12-13 DIAGNOSIS — Z590 Homelessness: Secondary | ICD-10-CM | POA: Diagnosis not present

## 2016-12-13 DIAGNOSIS — Z862 Personal history of diseases of the blood and blood-forming organs and certain disorders involving the immune mechanism: Secondary | ICD-10-CM | POA: Diagnosis not present

## 2016-12-13 DIAGNOSIS — D638 Anemia in other chronic diseases classified elsewhere: Secondary | ICD-10-CM | POA: Diagnosis not present

## 2016-12-13 DIAGNOSIS — E872 Acidosis, unspecified: Secondary | ICD-10-CM | POA: Diagnosis present

## 2016-12-13 DIAGNOSIS — R000 Tachycardia, unspecified: Secondary | ICD-10-CM | POA: Diagnosis not present

## 2016-12-13 DIAGNOSIS — D571 Sickle-cell disease without crisis: Secondary | ICD-10-CM | POA: Diagnosis present

## 2016-12-13 DIAGNOSIS — F1190 Opioid use, unspecified, uncomplicated: Secondary | ICD-10-CM

## 2016-12-13 DIAGNOSIS — E860 Dehydration: Secondary | ICD-10-CM | POA: Diagnosis not present

## 2016-12-13 DIAGNOSIS — N189 Chronic kidney disease, unspecified: Secondary | ICD-10-CM | POA: Diagnosis not present

## 2016-12-13 DIAGNOSIS — R222 Localized swelling, mass and lump, trunk: Secondary | ICD-10-CM | POA: Diagnosis not present

## 2016-12-13 DIAGNOSIS — Z79899 Other long term (current) drug therapy: Secondary | ICD-10-CM | POA: Diagnosis not present

## 2016-12-13 DIAGNOSIS — G894 Chronic pain syndrome: Secondary | ICD-10-CM | POA: Diagnosis not present

## 2016-12-13 DIAGNOSIS — D72829 Elevated white blood cell count, unspecified: Secondary | ICD-10-CM

## 2016-12-13 DIAGNOSIS — N183 Chronic kidney disease, stage 3 (moderate): Secondary | ICD-10-CM | POA: Diagnosis present

## 2016-12-13 DIAGNOSIS — Z23 Encounter for immunization: Secondary | ICD-10-CM

## 2016-12-13 DIAGNOSIS — D631 Anemia in chronic kidney disease: Secondary | ICD-10-CM | POA: Diagnosis present

## 2016-12-13 DIAGNOSIS — N182 Chronic kidney disease, stage 2 (mild): Secondary | ICD-10-CM

## 2016-12-13 DIAGNOSIS — G9341 Metabolic encephalopathy: Secondary | ICD-10-CM | POA: Diagnosis not present

## 2016-12-13 DIAGNOSIS — N08 Glomerular disorders in diseases classified elsewhere: Secondary | ICD-10-CM

## 2016-12-13 DIAGNOSIS — R05 Cough: Secondary | ICD-10-CM | POA: Diagnosis not present

## 2016-12-13 DIAGNOSIS — R0902 Hypoxemia: Secondary | ICD-10-CM | POA: Diagnosis not present

## 2016-12-13 DIAGNOSIS — Z452 Encounter for adjustment and management of vascular access device: Secondary | ICD-10-CM | POA: Diagnosis not present

## 2016-12-13 DIAGNOSIS — E871 Hypo-osmolality and hyponatremia: Secondary | ICD-10-CM | POA: Diagnosis present

## 2016-12-13 DIAGNOSIS — J9601 Acute respiratory failure with hypoxia: Secondary | ICD-10-CM | POA: Diagnosis not present

## 2016-12-13 DIAGNOSIS — F17210 Nicotine dependence, cigarettes, uncomplicated: Secondary | ICD-10-CM | POA: Diagnosis not present

## 2016-12-13 DIAGNOSIS — R079 Chest pain, unspecified: Secondary | ICD-10-CM | POA: Diagnosis not present

## 2016-12-13 DIAGNOSIS — E875 Hyperkalemia: Secondary | ICD-10-CM | POA: Diagnosis not present

## 2016-12-13 DIAGNOSIS — D649 Anemia, unspecified: Secondary | ICD-10-CM | POA: Diagnosis present

## 2016-12-13 LAB — COMPREHENSIVE METABOLIC PANEL
ALK PHOS: 186 U/L — AB (ref 38–126)
ALT: 51 U/L (ref 17–63)
ANION GAP: 7 (ref 5–15)
AST: 68 U/L — ABNORMAL HIGH (ref 15–41)
Albumin: 4.1 g/dL (ref 3.5–5.0)
BILIRUBIN TOTAL: 4.3 mg/dL — AB (ref 0.3–1.2)
BUN: 16 mg/dL (ref 6–20)
CALCIUM: 8.7 mg/dL — AB (ref 8.9–10.3)
CO2: 20 mmol/L — ABNORMAL LOW (ref 22–32)
CREATININE: 1.94 mg/dL — AB (ref 0.61–1.24)
Chloride: 109 mmol/L (ref 101–111)
GFR calc non Af Amer: 44 mL/min — ABNORMAL LOW (ref >60)
GFR, EST AFRICAN AMERICAN: 51 mL/min — AB (ref >60)
GLUCOSE: 104 mg/dL — AB (ref 65–99)
Potassium: 5 mmol/L (ref 3.5–5.1)
Sodium: 136 mmol/L (ref 135–145)
TOTAL PROTEIN: 7.4 g/dL (ref 6.5–8.1)

## 2016-12-13 LAB — CBC WITH DIFFERENTIAL/PLATELET
Basophils Absolute: 0 10*3/uL (ref 0.0–0.1)
Basophils Relative: 0 %
EOS ABS: 0.3 10*3/uL (ref 0.0–0.7)
Eosinophils Relative: 2 %
HCT: 27.3 % — ABNORMAL LOW (ref 39.0–52.0)
Hemoglobin: 9.8 g/dL — ABNORMAL LOW (ref 13.0–17.0)
LYMPHS ABS: 3.2 10*3/uL (ref 0.7–4.0)
LYMPHS PCT: 21 %
MCH: 40.2 pg — AB (ref 26.0–34.0)
MCHC: 35.9 g/dL (ref 30.0–36.0)
MCV: 111.9 fL — AB (ref 78.0–100.0)
MONO ABS: 2 10*3/uL — AB (ref 0.1–1.0)
Monocytes Relative: 13 %
NEUTROS PCT: 64 %
NRBC: 1 /100{WBCs} — AB
Neutro Abs: 9.5 10*3/uL — ABNORMAL HIGH (ref 1.7–7.7)
PLATELETS: 129 10*3/uL — AB (ref 150–400)
RBC: 2.44 MIL/uL — ABNORMAL LOW (ref 4.22–5.81)
RDW: 19.2 % — AB (ref 11.5–15.5)
WBC: 15 10*3/uL — ABNORMAL HIGH (ref 4.0–10.5)

## 2016-12-13 LAB — RETICULOCYTES
RBC.: 2.44 MIL/uL — ABNORMAL LOW (ref 4.22–5.81)
RETIC CT PCT: 15.6 % — AB (ref 0.4–3.1)
Retic Count, Absolute: 380.6 10*3/uL — ABNORMAL HIGH (ref 19.0–186.0)

## 2016-12-13 LAB — TROPONIN I

## 2016-12-13 LAB — LACTATE DEHYDROGENASE: LDH: 416 U/L — ABNORMAL HIGH (ref 98–192)

## 2016-12-13 MED ORDER — HYDROMORPHONE HCL 1 MG/ML IJ SOLN
0.5000 mg | INTRAMUSCULAR | Status: AC
  Administered 2016-12-13: 0.5 mg via INTRAVENOUS
  Filled 2016-12-13: qty 1

## 2016-12-13 MED ORDER — HYDROMORPHONE HCL 1 MG/ML IJ SOLN
0.5000 mg | Freq: Once | INTRAMUSCULAR | Status: AC
  Administered 2016-12-14: 0.5 mg via INTRAVENOUS
  Filled 2016-12-13: qty 1

## 2016-12-13 MED ORDER — ENOXAPARIN SODIUM 40 MG/0.4ML ~~LOC~~ SOLN
40.0000 mg | SUBCUTANEOUS | Status: DC
  Administered 2016-12-13 – 2016-12-14 (×2): 40 mg via SUBCUTANEOUS
  Filled 2016-12-13 (×3): qty 0.4

## 2016-12-13 MED ORDER — NORTRIPTYLINE HCL 25 MG PO CAPS
25.0000 mg | ORAL_CAPSULE | Freq: Every day | ORAL | Status: DC
  Administered 2016-12-13 – 2016-12-20 (×7): 25 mg via ORAL
  Filled 2016-12-13 (×8): qty 1

## 2016-12-13 MED ORDER — SODIUM CHLORIDE 0.45 % IV SOLN
INTRAVENOUS | Status: DC
  Administered 2016-12-13: 13:00:00 via INTRAVENOUS

## 2016-12-13 MED ORDER — INFLUENZA VAC SPLIT QUAD 0.5 ML IM SUSY
0.5000 mL | PREFILLED_SYRINGE | Freq: Once | INTRAMUSCULAR | Status: AC
  Administered 2016-12-13: 0.5 mL via INTRAMUSCULAR
  Filled 2016-12-13: qty 0.5

## 2016-12-13 MED ORDER — HYDROMORPHONE HCL 1 MG/ML IJ SOLN
1.0000 mg | INTRAMUSCULAR | Status: AC
  Administered 2016-12-13: 1 mg via INTRAVENOUS
  Filled 2016-12-13: qty 1

## 2016-12-13 MED ORDER — HYDROMORPHONE HCL 1 MG/ML IJ SOLN: 1.0000 mg | INTRAMUSCULAR | Status: AC

## 2016-12-13 MED ORDER — OXYCODONE HCL 5 MG PO TABS
10.0000 mg | ORAL_TABLET | Freq: Four times a day (QID) | ORAL | Status: DC
  Administered 2016-12-13 – 2016-12-14 (×2): 10 mg via ORAL
  Filled 2016-12-13 (×2): qty 2

## 2016-12-13 MED ORDER — SENNOSIDES-DOCUSATE SODIUM 8.6-50 MG PO TABS
1.0000 | ORAL_TABLET | Freq: Two times a day (BID) | ORAL | Status: DC
  Administered 2016-12-14: 1 via ORAL
  Filled 2016-12-13 (×13): qty 1

## 2016-12-13 MED ORDER — HYDROMORPHONE HCL 1 MG/ML IJ SOLN: 1.0000 mg | INTRAMUSCULAR | Status: DC

## 2016-12-13 MED ORDER — HYDROMORPHONE 1 MG/ML IV SOLN
INTRAVENOUS | Status: DC
  Administered 2016-12-13: 19:00:00 via INTRAVENOUS
  Administered 2016-12-14: 1.6 mg via INTRAVENOUS
  Administered 2016-12-14: 0.4 mg via INTRAVENOUS
  Administered 2016-12-14: 0.2 mg via INTRAVENOUS
  Administered 2016-12-14: 1.2 mg via INTRAVENOUS
  Filled 2016-12-13: qty 25

## 2016-12-13 MED ORDER — HYDROMORPHONE HCL 1 MG/ML IJ SOLN
1.0000 mg | INTRAMUSCULAR | Status: DC
  Administered 2016-12-13: 1 mg via INTRAVENOUS
  Filled 2016-12-13: qty 1

## 2016-12-13 MED ORDER — DIPHENHYDRAMINE HCL 25 MG PO CAPS
25.0000 mg | ORAL_CAPSULE | ORAL | Status: DC | PRN
  Administered 2016-12-15: 50 mg via ORAL
  Filled 2016-12-13: qty 2

## 2016-12-13 MED ORDER — HYDROMORPHONE HCL 1 MG/ML IJ SOLN: 0.5000 mg | INTRAMUSCULAR | Status: AC

## 2016-12-13 MED ORDER — SODIUM CHLORIDE 0.45 % IV SOLN: INTRAVENOUS | Status: DC

## 2016-12-13 MED ORDER — SODIUM CHLORIDE 0.9% FLUSH: 9.0000 mL | INTRAVENOUS | Status: DC | PRN

## 2016-12-13 MED ORDER — ONDANSETRON HCL 4 MG/2ML IJ SOLN
4.0000 mg | Freq: Four times a day (QID) | INTRAMUSCULAR | Status: DC | PRN
  Administered 2016-12-13 – 2016-12-18 (×2): 4 mg via INTRAVENOUS
  Filled 2016-12-13 (×2): qty 2

## 2016-12-13 MED ORDER — HYDROMORPHONE HCL 1 MG/ML IJ SOLN
1.0000 mg | Freq: Once | INTRAMUSCULAR | Status: AC
  Administered 2016-12-13: 1 mg via INTRAVENOUS
  Filled 2016-12-13: qty 1

## 2016-12-13 MED ORDER — NALOXONE HCL 0.4 MG/ML IJ SOLN: 0.4000 mg | INTRAMUSCULAR | Status: DC | PRN

## 2016-12-13 MED ORDER — DIPHENHYDRAMINE HCL 50 MG/ML IJ SOLN
25.0000 mg | Freq: Once | INTRAMUSCULAR | Status: AC
  Administered 2016-12-13: 25 mg via INTRAVENOUS
  Filled 2016-12-13: qty 1

## 2016-12-13 MED ORDER — POLYETHYLENE GLYCOL 3350 17 G PO PACK
17.0000 g | PACK | Freq: Every day | ORAL | Status: DC | PRN
  Administered 2016-12-14: 17 g via ORAL
  Filled 2016-12-13: qty 1

## 2016-12-13 MED ORDER — PNEUMOCOCCAL VAC POLYVALENT 25 MCG/0.5ML IJ INJ
0.5000 mL | INJECTION | Freq: Once | INTRAMUSCULAR | Status: AC
  Administered 2016-12-13: 0.5 mL via INTRAMUSCULAR
  Filled 2016-12-13: qty 0.5

## 2016-12-13 MED ORDER — HYDROXYUREA 500 MG PO CAPS
1500.0000 mg | ORAL_CAPSULE | Freq: Every day | ORAL | Status: DC
  Administered 2016-12-13 – 2016-12-17 (×5): 1500 mg via ORAL
  Filled 2016-12-13 (×5): qty 3

## 2016-12-13 MED ORDER — SODIUM CHLORIDE 0.9 % IV SOLN
25.0000 mg | INTRAVENOUS | Status: DC | PRN
  Filled 2016-12-13: qty 0.5

## 2016-12-13 NOTE — ED Provider Notes (Signed)
[facility] DEPT Provider Note   CSN: [id number] Arrival date & time: 12/13/16  1133     History   Chief Complaint Chief Complaint  Patient presents with  . Sickle Cell Pain Crisis    HPI [name] Pernell. is a 36 y.o. male with a history of sickle cell SS disease who presents the emergency department with chief complaint of sickle cell pain crisis.  Patient states that his symptoms began yesterday afternoon.  He complains of pain in his arms and legs but his worst pain is currently in his chest.  He has had a history of acute chest crisis he is unsure if his symptoms feel similar to that or not.  He has a slight cough which he states comes and goes.  He is a smoker.  Patient denies fevers or chills.  He denies nausea, vomiting or diaphoresis.  The patient currently takes OxyContin 10 mg daily.  He has no medication for breakthrough pain except for Tylenol or NSAIDs.  This effectively makes him opiate nave.  HPI  Past Medical History:  Diagnosis Date  . Bacterial pneumonia ~ 2012   "caught it here in the hospital" (09/25/2012)  . Chronic kidney disease    "from my sickle cell" (09/25/2012)  . Cyst of brain    "2 really small ones in the back of my head; inside; saw them w/MRI" (09/25/2012)  . GERD (gastroesophageal reflux disease)    "after I eat alot of spicey foods" (09/25/2012)  . Gynecomastia, male 07/10/2012  . History of blood transfusion    "always related to sickle cell crisis" (09/25/2012)  . Migraines    "take RX qd to prevent them" (09/25/2012)  . Sickle cell anemia (HCC)   . Sickle cell crisis ([place]) 09/25/2012  . Sickle cell nephropathy ([place]) 07/10/2012  . Tachycardia with heart rate 121-140 beats per minute with ambulation 08/04/2016    Patient Active Problem List   Diagnosis Date Noted  . Sickle cell crisis ([place]) 10/14/2016  . ARF (acute renal failure) ([place]) 10/14/2016  . Hyponatremia 10/14/2016  . Tachycardia with heart rate  121-140 beats per minute with ambulation 08/04/2016  . Normal anion gap metabolic acidosis 16/38/4665  . Anemia of chronic disease 08/02/2016  . Macrocytosis due to Hydroxyurea 08/02/2016  . Hyperkalemia 09/26/2012  . Chronic, continuous use of opioids 08/30/2012  . Chronic headaches 07/10/2012  . Gynecomastia, male 07/10/2012  . Sickle cell nephropathy ([place]) 07/10/2012  . Systolic murmur 99/35/7017  . Migraine 11/26/2009  . CHRONIC KIDNEY DISEASE STAGE II (MILD) 03/06/2009  . Sickle cell disease, type SS.  06/18/2008  . TOBACCO ABUSE 05/22/2007    Past Surgical History:  Procedure Laterality Date  . CHOLECYSTECTOMY  ~ 2012       Home Medications    Prior to Admission medications   Medication Sig Start Date End Date Taking? Authorizing Provider  acetaminophen (TYLENOL) 325 MG tablet Take 650 mg by mouth 2 (two) times daily as needed for moderate pain.   Yes [provider]  hydroxyurea (HYDREA) 500 MG capsule TAKE 3 CAPSULES (1,500 MG TOTAL) BY MOUTH DAILY. 08/23/16  Yes Donnamae Jude, MD  nortriptyline (PAMELOR) 25 MG capsule TAKE 1 CAPSULE (25 MG TOTAL) BY MOUTH AT BEDTIME. 12/28/15  Yes Donnamae Jude, MD  Oxycodone HCl 10 MG TABS Take 10 mg by mouth every 6 (six) hours as needed.   Yes [provider]  sodium bicarbonate 650 MG tablet Take 1 tablet (  650 mg total) by mouth 2 (two) times daily. Patient not taking: Reported on 10/14/2016 08/04/16   Leana Gamer, MD    Family History Family History  Problem Relation Age of Onset  . Breast cancer Mother     Social History Social History  Substance Use Topics  . Smoking status: Current Some Day Smoker    Types: Cigarettes  . Smokeless tobacco: Never Used     Comment: 09/25/2012 "I don't buy cigarettes; bum one from friends q now and then"  . Alcohol use 0.0 oz/week     Comment: occasionally     Allergies   Morphine and related   Review of Systems Review of Systems  Ten systems reviewed  and are negative for acute change, except as noted in the HPI.   Physical Exam Updated Vital Signs BP 124/85   Pulse 88   Resp 13   Ht 5\' 3"  (1.6 m)   Wt 59 kg (130 lb)   SpO2 92%   BMI 23.03 kg/m   Physical Exam  Constitutional: He appears well-developed and well-nourished. No distress.  Appears uncomfortable.  HENT:  Head: Normocephalic and atraumatic.  Eyes: Conjunctivae are normal. No scleral icterus.  Neck: Normal range of motion. Neck supple.  Cardiovascular: Regular rhythm and normal heart sounds.   tachycardia  Pulmonary/Chest: Effort normal and breath sounds normal. No respiratory distress.  Abdominal: Soft. There is no tenderness.  Musculoskeletal: He exhibits no edema.  Neurological: He is alert.  Skin: Skin is warm and dry. He is not diaphoretic.  Psychiatric: His behavior is normal.  Nursing note and vitals reviewed.    ED Treatments / Results  Labs (all labs ordered are listed, but only abnormal results are displayed) Labs Reviewed  COMPREHENSIVE METABOLIC PANEL - Abnormal; Notable for the following:       Result Value   CO2 20 (*)    Glucose, Bld 104 (*)    Creatinine, Ser 1.94 (*)    Calcium 8.7 (*)    AST 68 (*)    Alkaline Phosphatase 186 (*)    Total Bilirubin 4.3 (*)    GFR calc non Af Amer 44 (*)    GFR calc Af Amer 51 (*)    All other components within normal limits  CBC WITH DIFFERENTIAL/PLATELET - Abnormal; Notable for the following:    WBC 15.0 (*)    RBC 2.44 (*)    Hemoglobin 9.8 (*)    HCT 27.3 (*)    MCV 111.9 (*)    MCH 40.2 (*)    RDW 19.2 (*)    Platelets 129 (*)    nRBC 1 (*)    Neutro Abs 9.5 (*)    Monocytes Absolute 2.0 (*)    All other components within normal limits  RETICULOCYTES - Abnormal; Notable for the following:    Retic Ct Pct 15.6 (*)    RBC. 2.44 (*)    Retic Count, Absolute 380.6 (*)    All other components within normal limits  LACTATE DEHYDROGENASE - Abnormal; Notable for the following:    LDH 416  (*)    All other components within normal limits  TROPONIN I    EKG  EKG Interpretation  Date/Time:  Wednesday December 13 2016 12:28:43 EDT Ventricular Rate:  92 PR Interval:    QRS Duration: 85 QT Interval:  354 QTC Calculation: 438 R Axis:   67 Text Interpretation:  Sinus rhythm Nonspecific T abnormalities, lateral leads No significant change since last  tracing Confirmed by Gareth Morgan [phone]) on 12/13/2016 1:17:50 PM       Radiology Dg Chest 2 View  Result Date: 12/13/2016 CLINICAL DATA:  Sickle cell disease with cough and chest pain EXAM: CHEST  2 VIEW COMPARISON:  October 17, 2014 FINDINGS: Lungs are clear. Heart size and pulmonary vascularity are normal. No adenopathy. There is sclerosis in the proximal right humeral head with changes suggesting a degree of avascular necrosis. IMPRESSION: Findings felt to represent a degree of avascular necrosis in the right humeral head. No edema or consolidation. Stable cardiac silhouette. Electronically Signed   By: Lowella Grip III M.D.   On: 12/13/2016 13:57    Procedures Procedures (including critical care time)  Medications Ordered in ED Medications  0.45 % sodium chloride infusion ( Intravenous New Bag/Given 12/13/16 1325)  HYDROmorphone (DILAUDID) injection 1 mg (not administered)    Or  HYDROmorphone (DILAUDID) injection 1 mg (not administered)  HYDROmorphone (DILAUDID) injection 0.5 mg (0.5 mg Intravenous Given 12/13/16 1327)    Or  HYDROmorphone (DILAUDID) injection 0.5 mg ( Subcutaneous See Alternative 12/13/16 1327)  HYDROmorphone (DILAUDID) injection 1 mg (1 mg Intravenous Given 12/13/16 1405)    Or  HYDROmorphone (DILAUDID) injection 1 mg ( Subcutaneous See Alternative 12/13/16 1405)  HYDROmorphone (DILAUDID) injection 1 mg (1 mg Intravenous Given 12/13/16 1436)    Or  HYDROmorphone (DILAUDID) injection 1 mg ( Subcutaneous See Alternative 12/13/16 1436)  diphenhydrAMINE (BENADRYL) injection 25 mg (25 mg  Intravenous Given 12/13/16 1328)     Initial Impression / Assessment and Plan / ED Course  I have reviewed the triage vital signs and the nursing notes.  Pertinent labs & imaging results that were available during my care of the patient were reviewed by me and considered in my medical decision making (see chart for details).  Clinical Course as of Dec 14 1534  Wed Dec 13, 2016  1408 Patient with small degree of AKI.  LDH is elevated. His cxr is reviewed and shows some AVN of the R humeral head, no evidence of acute chest on cxr.  [AH]  1534 Patient feels somewhat improved after three doses, but still having significant pain. I have discussed admission vs. Discharge and the patient does not feel ready for discharge. Patient is still having sig pain and does appear to be in acute pain crisis. Will continue to treat pain per SS protocol and call for admission.  [AH]    Clinical Course User Index [AH] Margarita Mail, PA-C    Patient with tachycardia, EKG shows nonspecific t wave abnormalities. His work up is otherwise pending. Patient is afebrile and does not appear to be septic.  Final Clinical Impressions(s) / ED Diagnoses   Final diagnoses:  Vasoocclusive sickle cell crisis [facility])    New Prescriptions New Prescriptions   No medications on file     Margarita Mail, PA-C 12/13/16 2132    Gareth Morgan, MD 12/14/16 1301

## 2016-12-13 NOTE — ED Notes (Signed)
Patient transported to X-ray 

## 2016-12-13 NOTE — ED Triage Notes (Signed)
Per pt, states sickle cell pain that started yesterday-states pain all over and having chest pain radiating down left arm

## 2016-12-13 NOTE — ED Notes (Signed)
Bed: XY81 Expected date:  Expected time:  Means of arrival:  Comments: Hold for triage 4

## 2016-12-13 NOTE — H&P (Signed)
Hospital Admission Note Date: 12/13/2016  Patient name: [name] [name]. Medical record number: [id number] Date of birth: 11/06/1980 Age: 36 y.o. Gender: male PCP: Billee Cashing, MD  Attending physician: Altha Harm, MD  Chief Complaint: Pain all over  History of Present Illness: [name] is an opiate tolerant patient with Hb SS who presents to the ED with pain characteristic of his sickle cell crisis. He is currently arousable and able to speak coherently on th ephone with family members but is uncooperative with providing information regarding the present illness. He only states that he is hurting all over and does not want to be bothered with any questions. He states that he ran out of his medicine but offers no other information.   I the ED he received 3 doses of IV Dilaudid over a 2 hour period as there was some concern ability to tolerate higher and more frequent doses of medication. Per the ED provider he was not forthcoming with much information and thus he was assumed opiate naive. NSAID's are contraindicated for this patient due to CKD.   Scheduled Meds: . enoxaparin (LOVENOX) injection  40 mg Subcutaneous Q24H  . HYDROmorphone   Intravenous Q4H  . hydroxyurea  1,500 mg Oral Daily  . Influenza vac split quadrivalent PF  0.5 mL Intramuscular Once  . nortriptyline  25 mg Oral QHS  . oxyCODONE  10 mg Oral Q6H  . pneumococcal 23 valent vaccine  0.5 mL Intramuscular Once  . senna-docusate  1 tablet Oral BID   Continuous Infusions: . sodium chloride    . diphenhydrAMINE (BENADRYL) IVPB(SICKLE CELL ONLY)     PRN Meds:.diphenhydrAMINE **OR** diphenhydrAMINE (BENADRYL) IVPB(SICKLE CELL ONLY), naloxone **AND** sodium chloride flush, ondansetron (ZOFRAN) IV, polyethylene glycol Allergies: Nsaids and Morphine and related Past Medical History:  Diagnosis Date  . Bacterial pneumonia ~ 2012   "caught it here in the hospital" (09/25/2012)  . Chronic kidney disease     "from my sickle cell" (09/25/2012)  . Cyst of brain    "2 really small ones in the back of my head; inside; saw them w/MRI" (09/25/2012)  . GERD (gastroesophageal reflux disease)    "after I eat alot of spicey foods" (09/25/2012)  . Gynecomastia, male 07/10/2012  . History of blood transfusion    "always related to sickle cell crisis" (09/25/2012)  . Migraines    "take RX qd to prevent them" (09/25/2012)  . Sickle cell anemia (HCC)   . Sickle cell crisis (HCC) 09/25/2012  . Sickle cell nephropathy (HCC) 07/10/2012  . Tachycardia with heart rate 121-140 beats per minute with ambulation 08/04/2016   Past Surgical History:  Procedure Laterality Date  . CHOLECYSTECTOMY  ~ 2012   Family History  Problem Relation Age of Onset  . Breast cancer Mother    Social History   Social History  . Marital status: Single    Spouse name: N/A  . Number of children: N/A  . Years of education: N/A   Occupational History  . Not on file.   Social History Main Topics  . Smoking status: Current Some Day Smoker    Types: Cigarettes  . Smokeless tobacco: Never Used     Comment: 09/25/2012 "I don't buy cigarettes; bum one from friends q now and then"  . Alcohol use 0.0 oz/week     Comment: occasionally  . Drug use: Yes    Types: Marijuana     Comment: 09/25/2012 "weed was my biggest problem; stopped that ~ 1  1/2 months ago; I don't do cocaine; can't say I never have though"  . Sexual activity: Yes   Other Topics Concern  . Not on file   Social History Narrative    Lives alone in [place].   Back at school, studying Patent attorney. Unemployed.    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    Review of Systems: Review of systems not obtained due to patient factors.  Physical Exam: No intake or output data in the 24 hours ending 12/13/16 1813 General:  Alert, awake, oriented x3, in no acute distress.  HEENT: /AT PEERL, EOMI, mild icterus Neck: Trachea midline,  no masses, no thyromegal,y no JVD, no carotid bruit OROPHARYNX:  Moist, No exudate/ erythema/lesions.  Heart: Regular rate and rhythm, II/VI non-radiating systolic ejection murmur at R&L 2nd intercostal space.  No rubs or gallops, PMI non-displaced, no heaves or thrills on palpation.  Lungs: Clear to auscultation, no wheezing or rhonchi noted. No increased vocal fremitus resonant to percussion  Abdomen: Soft, nontender, nondistended, positive bowel sounds, no masses no hepatosplenomegaly noted..  Neuro: No focal neurological deficits noted cranial nerves II through XII grossly intact. Strength appears at baseline judging by his ability to ambulate and get in and out of bed.  Musculoskeletal: No warmth swelling or erythema around joints, no spinal tenderness noted. Psychiatric: Patient alert and oriented x 3.   Lab results:  Recent Labs  12/13/16 1311  NA 136  K 5.0  CL 109  CO2 20*  GLUCOSE 104*  BUN 16  CREATININE 1.94*  CALCIUM 8.7*    Recent Labs  12/13/16 1311  AST 68*  ALT 51  ALKPHOS 186*  BILITOT 4.3*  PROT 7.4  ALBUMIN 4.1   No results for input(s): LIPASE, AMYLASE in the last 72 hours.  Recent Labs  12/13/16 1311  WBC 15.0*  NEUTROABS 9.5*  HGB 9.8*  HCT 27.3*  MCV 111.9*  PLT 129*    Recent Labs  12/13/16 1311  TROPONINI <0.03   Invalid input(s): POCBNP No results for input(s): DDIMER in the last 72 hours. No results for input(s): HGBA1C in the last 72 hours. No results for input(s): CHOL, HDL, LDLCALC, TRIG, CHOLHDL, LDLDIRECT in the last 72 hours. No results for input(s): TSH, T4TOTAL, T3FREE, THYROIDAB in the last 72 hours.  Invalid input(s): FREET3  Recent Labs  12/13/16 1311  RETICCTPCT 15.6*   Imaging results:  Dg Chest 2 View  Result Date: 12/13/2016 CLINICAL DATA:  Sickle cell disease with cough and chest pain EXAM:  CHEST  2 VIEW COMPARISON:  October 17, 2014 FINDINGS: Lungs are clear. Heart size and pulmonary vascularity are normal. No adenopathy. There is sclerosis in the proximal right humeral head with changes suggesting a degree of avascular necrosis. IMPRESSION: Findings felt to represent a degree of avascular necrosis in the right humeral head. No edema or consolidation. Stable cardiac silhouette. Electronically Signed   By: Bretta Bang III M.D.   On: 12/13/2016 13:57   Other results: EKG: normal EKG, normal sinus rhythm, unchanged from previous tracings, nonspecific ST and T waves changes.   Assessment and Plan: 1. Hb SS with Crisis: In the absence of knowledge if opiate use for the last 2 weeks will assume opiate naive state and place on PCA at bolus dose of 0.4 mg with lockout of 10 minutes. No  Toradol due to CKD. IVF 0.45 @100  ml/hr. 2. CKD II: Monitor renal fucntion 3. Chronic Pain Syndrome: From previous records it appears that he takes IR Oxycodone on a daily basis but unable to ascertain today as patient refusing to cooperate. 4. Anemia of Chronic Disease: Baseline Hb is 8 g/dl. Currently above baseline which is likely a reflection of hemo-concentrated state. 5. Leukocytosis: No evidence of infection.Likely systemic inflammation due to sickle cell crisis. 6. Mild Metabolic Acidosis: From last discharge summary he is prescribed sodium bicarbonate however unclear if he is currently taking.  7. DVT Prophylaxis: Lovenox.  8. Psychosocial: It appears that Mr. Taiwo is dealing with issues of homelessness. SW consulted from the ED.   In excess of 60 minutes spent during this encounter. Greater than 50% of time spent in face to face contact, Counseling and coordination of care. It is my medical opinion that patient is acutely ill enough to warrant  admission both for further evaluation and management of current condition.    Josalyn Dettmann A. 12/13/2016, 6:13 PM    Antavia Tandy  A.  Pager [phone]. If 7PM-7AM, please contact night-coverage.

## 2016-12-14 DIAGNOSIS — E872 Acidosis: Secondary | ICD-10-CM

## 2016-12-14 DIAGNOSIS — E875 Hyperkalemia: Secondary | ICD-10-CM

## 2016-12-14 LAB — BASIC METABOLIC PANEL
ANION GAP: 9 (ref 5–15)
BUN: 23 mg/dL — ABNORMAL HIGH (ref 6–20)
CALCIUM: 8.6 mg/dL — AB (ref 8.9–10.3)
CO2: 16 mmol/L — ABNORMAL LOW (ref 22–32)
Chloride: 105 mmol/L (ref 101–111)
Creatinine, Ser: 1.86 mg/dL — ABNORMAL HIGH (ref 0.61–1.24)
GFR, EST AFRICAN AMERICAN: 53 mL/min — AB (ref >60)
GFR, EST NON AFRICAN AMERICAN: 46 mL/min — AB (ref >60)
Glucose, Bld: 103 mg/dL — ABNORMAL HIGH (ref 65–99)
Potassium: 5.5 mmol/L — ABNORMAL HIGH (ref 3.5–5.1)
SODIUM: 130 mmol/L — AB (ref 135–145)

## 2016-12-14 MED ORDER — HYDROMORPHONE 1 MG/ML IV SOLN
INTRAVENOUS | Status: DC
  Administered 2016-12-14: 0.5 mg via INTRAVENOUS
  Administered 2016-12-14: 1.5 mg via INTRAVENOUS
  Administered 2016-12-15 (×3): 0 mg via INTRAVENOUS
  Administered 2016-12-15: 0.5 mg via INTRAVENOUS
  Administered 2016-12-15: 0 mg via INTRAVENOUS
  Administered 2016-12-16: 21:00:00 via INTRAVENOUS
  Administered 2016-12-17: 3.5 mg via INTRAVENOUS
  Administered 2016-12-17: 18:00:00 via INTRAVENOUS
  Administered 2016-12-17: 5 mg via INTRAVENOUS
  Administered 2016-12-18: 6.5 mg via INTRAVENOUS
  Administered 2016-12-18: 3.5 mg via INTRAVENOUS
  Administered 2016-12-18: 1 mg via INTRAVENOUS
  Administered 2016-12-19: 4.5 mg via INTRAVENOUS
  Administered 2016-12-19: 5.5 mg via INTRAVENOUS
  Administered 2016-12-20: 2 mg via INTRAVENOUS
  Administered 2016-12-20: 3 mg via INTRAVENOUS
  Administered 2016-12-20: 1.5 mg via INTRAVENOUS
  Administered 2016-12-20: 3.5 mg via INTRAVENOUS
  Administered 2016-12-20: 10:00:00 via INTRAVENOUS
  Administered 2016-12-20 – 2016-12-21 (×3): 2.5 mg via INTRAVENOUS
  Administered 2016-12-21: 4 mg via INTRAVENOUS
  Administered 2016-12-21: 2.5 mg via INTRAVENOUS
  Administered 2016-12-21: 1.5 mg via INTRAVENOUS
  Filled 2016-12-14 (×4): qty 25

## 2016-12-14 MED ORDER — SODIUM CHLORIDE 0.9 % IV SOLN
INTRAVENOUS | Status: DC
  Administered 2016-12-14: 18:00:00 via INTRAVENOUS

## 2016-12-14 MED ORDER — MUSCLE RUB 10-15 % EX CREA
TOPICAL_CREAM | CUTANEOUS | Status: DC | PRN
  Administered 2016-12-20: 17:00:00 via TOPICAL
  Filled 2016-12-14: qty 85

## 2016-12-14 MED ORDER — OXYCODONE HCL 5 MG PO TABS
10.0000 mg | ORAL_TABLET | ORAL | Status: DC
  Administered 2016-12-14 – 2016-12-15 (×7): 10 mg via ORAL
  Filled 2016-12-14 (×7): qty 2

## 2016-12-14 NOTE — Progress Notes (Signed)
SICKLE CELL SERVICE PROGRESS NOTE  [name] [name]. FFM:[id number] DOB: May 19, 1980 DOA: 12/13/2016 PCP: [name], MD  Assessment/Plan: Active Problems:   Vaso-occlusive sickle cell crisis ([place])  1. AKI in a setting of CKD II: At the time of last hospitalization on 10/18/2016, his Cr was 1.09 after hydration. I do not have records available from his Nephrologist and so unsure of baseline renal function. He has had an increase in BUN so will increase rate of IVF.  2. Hyperkalemia: He has a mild increase in potassium which is likely due to decreased renal excretion. Will re-assess after increase in IVF. Treat as warranted.  3. Hb SS with crisis: Increase PCA bolus to 0.5 mg, Continue Oxycodone 10 mg scheduled every 4 hours.  4. Metabolic Acidosis: Pt had a small decrease in serum bicarbonate. If any further decrease will consider bicarbonate in fluids.  5. Anemia of Chronic Disease: Hb at baseline. Continue Hydrea.    Code Status: Full Code Family Communication: N/A Disposition Plan: Not yet ready for discharge  [place].  Pager [phone]. If 7PM-7AM, please contact night-coverage.  12/14/2016, 12:17 PM  LOS: 1 day   Interim History: [name] [name] is still rather uncooperative today. He does not want to be bothered. However I was able to ascertain from him that he normally takes Oxycodone 10 mg BID on a regular basis and increases to every 4 hours when in a state of vaos-occlusive crisis. He ran out of his mediation 3 days ago and only doubled his opiates for 1 day. He denies taking any NSAIDS. He feels that the Dilaudid is ineffective but states that Fentanyl is also ineffective more so than Dilaudid and he has an allergy to Morphine. He is angry that he cannot receive Toradol but is hesitant to take Tylenol as he states that he has had conflicting statements about the effect of Tylenol on the Kidney. I tried to explain that Tylenol was safe in its use with regard to the  kidneys but stated that he knew what he knew and that he was not interested in taking Tylenol. He did accept the offer for topical muscle rub and this was ordered. He has used only 3.2 mg of Dilaudid on te PCA with 43/8:demands/deliveries since admission.   Consultants:  None  Procedures:  None  Antibiotics:  None   Objective: Vitals:   12/14/16 0432 12/14/16 0657 12/14/16 0855 12/14/16 1035  BP:  108/84  116/69  Pulse:  (!) 110  99  Resp: 16 13 10  (!) 22  Temp:  99.3 F (37.4 C)  99.2 F (37.3 C)  TempSrc:  Oral  Oral  SpO2: 98% 91% 91% 93%  Weight:  55.8 kg (123 lb)    Height:       Weight change:   Intake/Output Summary (Last 24 hours) at 12/14/16 1217 Last data filed at 12/14/16 0930  Gross per 24 hour  Intake                0 ml  Output              800 ml  Net             -800 ml      Physical Exam General: Alert, awake, oriented x3, in no acute distress.  HEENT: [place]/AT PEERL, EOMI, anicteric Neck: Trachea midline,  no masses, no thyromegal,y no JVD, no carotid bruit OROPHARYNX:  Moist, No exudate/ erythema/lesions.  Heart: Regular rate and rhythm, II/VI non-radiating  systolic ejection murmur at base. No rubs or gallops, PMI non-displaced, no heaves or thrills on palpation.  Lungs: Clear to auscultation, no wheezing or rhonchi noted. No increased vocal fremitus resonant to percussion  Abdomen: Soft, nontender, nondistended, positive bowel sounds, no masses no hepatosplenomegaly noted.  Neuro: No focal neurological deficits noted cranial nerves II through XII grossly intact. Strength at baseline in bilateral upper and lower extremities. Musculoskeletal: No warmth swelling or erythema around joints, no spinal tenderness noted. Psychiatric: Patient alert and oriented x3, uncooperative.    Data Reviewed: Basic Metabolic Panel:  Recent Labs Lab 12/13/16 1311  NA 136  K 5.0  CL 109  CO2 20*  GLUCOSE 104*  BUN 16  CREATININE 1.94*  CALCIUM 8.7*    Liver Function Tests:  Recent Labs Lab 12/13/16 1311  AST 68*  ALT 51  ALKPHOS 186*  BILITOT 4.3*  PROT 7.4  ALBUMIN 4.1   No results for input(s): LIPASE, AMYLASE in the last 168 hours. No results for input(s): AMMONIA in the last 168 hours. CBC:  Recent Labs Lab 12/13/16 1311  WBC 15.0*  NEUTROABS 9.5*  HGB 9.8*  HCT 27.3*  MCV 111.9*  PLT 129*   Cardiac Enzymes:  Recent Labs Lab 12/13/16 1311  TROPONINI <0.03   BNP (last 3 results) No results for input(s): BNP in the last 8760 hours.  ProBNP (last 3 results) No results for input(s): PROBNP in the last 8760 hours.  CBG: No results for input(s): GLUCAP in the last 168 hours.  No results found for this or any previous visit (from the past 240 hour(s)).   Studies: Dg Chest 2 View  Result Date: 12/13/2016 CLINICAL DATA:  Sickle cell disease with cough and chest pain EXAM: CHEST  2 VIEW COMPARISON:  October 17, 2014 FINDINGS: Lungs are clear. Heart size and pulmonary vascularity are normal. No adenopathy. There is sclerosis in the proximal right humeral head with changes suggesting a degree of avascular necrosis. IMPRESSION: Findings felt to represent a degree of avascular necrosis in the right humeral head. No edema or consolidation. Stable cardiac silhouette. Electronically Signed   By: Lowella Grip III M.D.   On: 12/13/2016 13:57    Scheduled Meds: . enoxaparin (LOVENOX) injection  40 mg Subcutaneous Q24H  . HYDROmorphone   Intravenous Q4H  . hydroxyurea  1,500 mg Oral Daily  . nortriptyline  25 mg Oral QHS  . oxyCODONE  10 mg Oral Q4H  . senna-docusate  1 tablet Oral BID   Continuous Infusions: . sodium chloride    . diphenhydrAMINE (BENADRYL) IVPB(SICKLE CELL ONLY)      Active Problems:   Vaso-occlusive sickle cell crisis (HCC)    In excess of 35 minutes spent during this visit. Greater than 50% involved face to face contact with the patient for assessment, counseling and coordination  of care.

## 2016-12-14 NOTE — Progress Notes (Signed)
Nutrition Brief Note  Patient identified on the Malnutrition Screening Tool (MST) Report   Pt's UBW is between 123-135 lb. Weight stable over the pat few years.   Wt Readings from Last 15 Encounters:  12/14/16 123 lb (55.8 kg)  10/16/16 135 lb 9.6 oz (61.5 kg)  08/01/16 135 lb (61.2 kg)  02/27/16 118 lb 9.7 oz (53.8 kg)  12/03/15 135 lb (61.2 kg)  09/20/15 135 lb (61.2 kg)  03/11/15 129 lb 15.7 oz (59 kg)  11/23/14 127 lb 6.4 oz (57.8 kg)  08/17/14 123 lb (55.8 kg)  05/12/14 129 lb 6.4 oz (58.7 kg)  03/23/14 126 lb 4.8 oz (57.3 kg)  03/19/14 125 lb 14.1 oz (57.1 kg)  12/28/13 125 lb (56.7 kg)  11/12/13 129 lb 12.8 oz (58.9 kg)  09/22/13 126 lb (57.2 kg)    Body mass index is 21.79 kg/m. Patient meets criteria for normal based on current BMI.   Current diet order is regular. Labs and medications reviewed.   No nutrition interventions warranted at this time. If nutrition issues arise, please consult RD.   Clayton Bibles, MS, RD, [place] [facility] Dietitian Pager: [phone] After Hours Pager: [phone]

## 2016-12-15 ENCOUNTER — Inpatient Hospital Stay (HOSPITAL_COMMUNITY): Payer: Medicare Other

## 2016-12-15 DIAGNOSIS — J9601 Acute respiratory failure with hypoxia: Secondary | ICD-10-CM

## 2016-12-15 DIAGNOSIS — R000 Tachycardia, unspecified: Secondary | ICD-10-CM

## 2016-12-15 DIAGNOSIS — G9341 Metabolic encephalopathy: Secondary | ICD-10-CM | POA: Diagnosis not present

## 2016-12-15 DIAGNOSIS — N179 Acute kidney failure, unspecified: Secondary | ICD-10-CM

## 2016-12-15 LAB — BASIC METABOLIC PANEL
ANION GAP: 10 (ref 5–15)
BUN: 31 mg/dL — ABNORMAL HIGH (ref 6–20)
CHLORIDE: 107 mmol/L (ref 101–111)
CO2: 15 mmol/L — ABNORMAL LOW (ref 22–32)
Calcium: 9.1 mg/dL (ref 8.9–10.3)
Creatinine, Ser: 2.13 mg/dL — ABNORMAL HIGH (ref 0.61–1.24)
GFR calc Af Amer: 45 mL/min — ABNORMAL LOW (ref >60)
GFR calc non Af Amer: 39 mL/min — ABNORMAL LOW (ref >60)
Glucose, Bld: 104 mg/dL — ABNORMAL HIGH (ref 65–99)
POTASSIUM: 5.3 mmol/L — AB (ref 3.5–5.1)
SODIUM: 132 mmol/L — AB (ref 135–145)

## 2016-12-15 LAB — RETICULOCYTES: RBC.: 2.03 MIL/uL — AB (ref 4.22–5.81)

## 2016-12-15 LAB — CBC WITH DIFFERENTIAL/PLATELET
BASOS ABS: 0 10*3/uL (ref 0.0–0.1)
Basophils Relative: 0 %
EOS PCT: 0 %
Eosinophils Absolute: 0 10*3/uL (ref 0.0–0.7)
HEMATOCRIT: 21.5 % — AB (ref 39.0–52.0)
HEMOGLOBIN: 8.2 g/dL — AB (ref 13.0–17.0)
Lymphocytes Relative: 22 %
Lymphs Abs: 3.7 10*3/uL (ref 0.7–4.0)
MCH: 40.4 pg — ABNORMAL HIGH (ref 26.0–34.0)
MCHC: 38.1 g/dL — ABNORMAL HIGH (ref 30.0–36.0)
MCV: 105.9 fL — ABNORMAL HIGH (ref 78.0–100.0)
MONOS PCT: 14 %
Monocytes Absolute: 2.3 10*3/uL — ABNORMAL HIGH (ref 0.1–1.0)
NEUTROS PCT: 64 %
Neutro Abs: 10.6 10*3/uL — ABNORMAL HIGH (ref 1.7–7.7)
Platelets: 105 10*3/uL — ABNORMAL LOW (ref 150–400)
RBC: 2.03 MIL/uL — AB (ref 4.22–5.81)
RDW: 24.1 % — AB (ref 11.5–15.5)
WBC: 16.6 10*3/uL — AB (ref 4.0–10.5)
nRBC: 2 /100 WBC — ABNORMAL HIGH

## 2016-12-15 MED ORDER — SODIUM BICARBONATE 650 MG PO TABS
650.0000 mg | ORAL_TABLET | Freq: Three times a day (TID) | ORAL | Status: DC
  Administered 2016-12-15 – 2016-12-21 (×15): 650 mg via ORAL
  Filled 2016-12-15 (×16): qty 1

## 2016-12-15 MED ORDER — SODIUM CHLORIDE 0.9 % IV BOLUS (SEPSIS): 1000.0000 mL | Freq: Once | INTRAVENOUS | Status: DC

## 2016-12-15 MED ORDER — DEXTROSE 5 % IV SOLN
INTRAVENOUS | Status: DC
  Administered 2016-12-15: 13:00:00 via INTRAVENOUS
  Filled 2016-12-15: qty 100

## 2016-12-15 MED ORDER — SODIUM BICARBONATE 8.4 % IV SOLN: INTRAVENOUS | Status: DC

## 2016-12-15 MED ORDER — HYDROMORPHONE HCL 1 MG/ML IJ SOLN
1.0000 mg | INTRAMUSCULAR | Status: DC
Start: 2016-12-15 — End: 2016-12-16
  Administered 2016-12-15 – 2016-12-16 (×5): 1 mg via SUBCUTANEOUS
  Filled 2016-12-15 (×6): qty 1

## 2016-12-15 MED ORDER — OXYCODONE HCL 5 MG PO TABS
10.0000 mg | ORAL_TABLET | Freq: Four times a day (QID) | ORAL | Status: DC
  Administered 2016-12-16 – 2016-12-19 (×13): 10 mg via ORAL
  Filled 2016-12-15 (×14): qty 2

## 2016-12-15 MED ORDER — SODIUM POLYSTYRENE SULFONATE 15 GM/60ML PO SUSP
30.0000 g | Freq: Once | ORAL | Status: AC
  Administered 2016-12-15: 30 g via ORAL
  Filled 2016-12-15: qty 120

## 2016-12-15 NOTE — Progress Notes (Signed)
CSW acknowledged consult to assess homelessness. Per report pt new to area and "living in a shed." Attempted to meet with pt however pt would not participate in assessment. Would turn away from [place] when addressed. CSW will attempt to assess pt if he is willing later.  Sharren Bridge, MSW, LCSW Clinical Social Work 12/15/2016 [phone]

## 2016-12-15 NOTE — Progress Notes (Signed)
Transferred to [id number] via bed, awake, alert. Oriented.

## 2016-12-15 NOTE — Progress Notes (Signed)
Patient refusing to take medications, refusing to communicate with nursing staff. This RN completed an assessment but the pt would not speak to me unless I repeatedly asked him questions. He stated "what do you want, leave me alone." He refused to take his oxycodone. Continuously takes his oxygen off. Humidity was placed on the oxygen. Jonette Eva was made aware.

## 2016-12-15 NOTE — Progress Notes (Signed)
SICKLE CELL SERVICE PROGRESS NOTE  [name] [name]. CBJ:[id number] DOB: 11/16/1980 DOA: 12/13/2016 PCP: [name], MD  Assessment/Plan: Active Problems:   Vaso-occlusive sickle cell crisis ([place])  1. Hypoxia:Etiology unclear. It may be secondary to shallow breathing associated with pain. However with the accompanying tachycardia cannot exclude developing acute chest syndrome. Will obtain CXR.  2. Tachycardia: 12-lead EKG shows sinus tachycardia. 3. Dehydration: Pt lost PIV access since 1:00am this morning and thus has not received any IVF or IV analgesic. He ha snow re-established PIV so will give a bolus of 0.9NS and then give maintenance fluids of Sodium bicarbonate drip.  4. Poor PIV Access: Pt  lost IV access last night. However he has re-established PIV access now.  He is unlikely a candidate for PICC due to acute on chronic renal disease.   5. AKI in a setting of CKD II: At the time of last hospitalization on 10/18/2016, his Cr was 1.09 after hydration. I do not have records available from his Nephrologist and so unsure of baseline renal function. He has had an increase in BUN so will increase rate of IVF.  6. Hyperkalemia: He has a mild increase in potassium which is likely due to decreased renal excretion. Will re-assess after increase in IVF. Treat as warranted.  7. Hb SS with crisis: Continue PCA bolus of 0.5 mg, Continue Oxycodone 10 mg scheduled every 4 hours. No NSAID's due to CKD.  8. Metabolic Acidosis: Pt has had a further decrease in serum bicarbonate. Will place on Sodium Bicarbonate infusion. 9. Anemia of Chronic Disease: Hb at baseline. Continue Hydrea.    Code Status: Full Code Family Communication: N/A Disposition Plan: Not yet ready for discharge  [place].  Pager [phone]. If 7PM-7AM, please contact night-coverage.  12/15/2016, 10:49 AM  LOS: 2 days   Interim History: [name] [name] was initially not responding but fully awake. After repeated  requests to participate, he then started responding to questions. He states that he heard my earlier questions but that he's going in and out of it. He did not appear post-ictal and was able to answer questions appropriately. He reports that he is having pain only in his legs and back at an intensity of  6-7/10.   Consultants:  None  Procedures:  None  Antibiotics:  None   Objective: Vitals:   12/15/16 0000 12/15/16 0215 12/15/16 0559 12/15/16 0937  BP:  132/79 124/84 119/61  Pulse:  (!) 129 (!) 126 (!) 126  Resp: 18 20 20 20   Temp:  99.8 F (37.7 C) 99.8 F (37.7 C) 99.6 F (37.6 C)  TempSrc:  Oral Oral Oral  SpO2: 96% 95% (!) 88% (!) 85%  Weight:   53.4 kg (117 lb 11.2 oz)   Height:       Weight change: -5.579 kg (-12 lb 4.8 oz) No intake or output data in the 24 hours ending 12/15/16 1049    Physical Exam  Vital Signs: HR 126, RR 18.  General: Alert, awake, oriented x3, in no apparent distress.  HEENT: [place]/AT PEERL, EOMI, anicteric Neck: Trachea midline,  no masses, no thyromegal,y no JVD, no carotid bruit OROPHARYNX:  Moist, No exudate/ erythema/lesions.  Heart: Regular rate and rhythm, II/VI non-radiating systolic ejection murmur at base. No rubs or gallops, PMI non-displaced, no heaves or thrills on palpation.  Lungs: Clear to auscultation, no wheezing or rhonchi noted. No increased vocal fremitus resonant to percussion  Abdomen: Soft, nontender, nondistended, positive bowel sounds, no masses no  hepatosplenomegaly noted.  Neuro: No focal neurological deficits noted cranial nerves II through XII grossly intact. Strength at baseline in bilateral upper and lower extremities. Musculoskeletal: No warmth swelling or erythema around joints, no spinal tenderness noted. Psychiatric: Patient alert and oriented x3, he has a normal affect but was initially slow to respond. However responded appropriately when he did respond. .    Data Reviewed: Basic Metabolic  Panel:  Recent Labs Lab 12/13/16 1311 12/14/16 1224 12/15/16 0652  NA 136 130* 132*  K 5.0 5.5* 5.3*  CL 109 105 107  CO2 20* 16* 15*  GLUCOSE 104* 103* 104*  BUN 16 23* 31*  CREATININE 1.94* 1.86* 2.13*  CALCIUM 8.7* 8.6* 9.1   Liver Function Tests:  Recent Labs Lab 12/13/16 1311  AST 68*  ALT 51  ALKPHOS 186*  BILITOT 4.3*  PROT 7.4  ALBUMIN 4.1   No results for input(s): LIPASE, AMYLASE in the last 168 hours. No results for input(s): AMMONIA in the last 168 hours. CBC:  Recent Labs Lab 12/13/16 1311  WBC 15.0*  NEUTROABS 9.5*  HGB 9.8*  HCT 27.3*  MCV 111.9*  PLT 129*   Cardiac Enzymes:  Recent Labs Lab 12/13/16 1311  TROPONINI <0.03   BNP (last 3 results) No results for input(s): BNP in the last 8760 hours.  ProBNP (last 3 results) No results for input(s): PROBNP in the last 8760 hours.  CBG: No results for input(s): GLUCAP in the last 168 hours.  No results found for this or any previous visit (from the past 240 hour(s)).   Studies: Dg Chest 2 View  Result Date: 12/13/2016 CLINICAL DATA:  Sickle cell disease with cough and chest pain EXAM: CHEST  2 VIEW COMPARISON:  October 17, 2014 FINDINGS: Lungs are clear. Heart size and pulmonary vascularity are normal. No adenopathy. There is sclerosis in the proximal right humeral head with changes suggesting a degree of avascular necrosis. IMPRESSION: Findings felt to represent a degree of avascular necrosis in the right humeral head. No edema or consolidation. Stable cardiac silhouette. Electronically Signed   By: Lowella Grip III M.D.   On: 12/13/2016 13:57    Scheduled Meds: . enoxaparin (LOVENOX) injection  40 mg Subcutaneous Q24H  . HYDROmorphone   Intravenous Q4H  . hydroxyurea  1,500 mg Oral Daily  . nortriptyline  25 mg Oral QHS  . oxyCODONE  10 mg Oral Q4H  . senna-docusate  1 tablet Oral BID   Continuous Infusions: .  sodium bicarbonate  infusion 1000 mL      Active Problems:    Vaso-occlusive sickle cell crisis (HCC)    In excess of 35 minutes spent during this visit. Greater than 50% involved face to face contact with the patient for assessment, counseling and coordination of care.

## 2016-12-15 NOTE — Progress Notes (Signed)
Pt very restless-moving and totally disregarding his iv tubing, iv site, etco 2 monitor.  Will not follow directions / instructions to keep 02 and monitor on.   Iv site leaking.  Disconnected at this time due to pt uncooperativeness.

## 2016-12-15 NOTE — Progress Notes (Addendum)
Patient ID: [name] [name]., male   DOB: 03-May-1980, 36 y.o.   MRN: [id number] Received call from Nurse stating that patient has again lost PIV access today and is refusing PICC line placement. Pt understands the importance of IVF in the management of his AKI and metabolic acidosis. He also understands the need for taking sodium bicarbonate which the patient did not want to take orally this morning. I have ordered Sodium Bicarbonate 650 mg BID PO for now. Will continue to try to encourage PICC line placement.  Rufina Kimery A.  Addendum:4:10 pm  Requested central line placement by CCM and Marni Griffon reports repeat of today's behavior of initially not responding verbally although appears fully awake. Pt then refused IV access placement. He continues to refuse IV placement. I will transfer to telemetry unit for closer monitoring and if continues consider evaluation for seizure activity. Currently he has capacity to make his medical decisions. However if he is indeed having seizure activity, this activity may alter his capacity to make decisions. Will continue to evaluate.   Shaquna Geigle A.

## 2016-12-15 NOTE — Progress Notes (Signed)
Iv team here- attempted to save right ac iv site.  Was unsuccessful. Iv team then was attempting to place a new iv and pt became angry and refused to let her continue.  Pt aware that he won't have access to pca without iv site.

## 2016-12-15 NOTE — Progress Notes (Signed)
Patient very uncooperative, won't keep his arm straight and lost his iv access 3x, refused iv restart by iv nurse the second time  , also refused another IV start by bedside nurse. Patient also said he does not want a PICC line. Patient  Was encouraged to just increased po fluids.

## 2016-12-15 NOTE — Progress Notes (Addendum)
Called to place [facility]  I came to the bedside. Introduced myself. Pt would look at me blankly but then turn away. Nursing staff tells me he has been verbal "when he feels like it". I informed him that I was here to help him get IV access so that he could receive his medications. He did not acknowledge this. I am not sure if he is refusing or if this is encephalopathy. But per nursing he has been verbal and can understand.   -we would be happy to place central access if we can get him to consent. I spoke w/ Dr Zigmund Daniel who knows the patient from prior admissions. She will eval and decide from there...  -please call us back [phone] pager) if the patient is either 1) able & willing to consent or 2) deemed unable to give consent and consent can be obtained by some one who can act as HCPOA.    Erick Colace ACNP-BC [place] Pager # [phone] OR # [phone] if no answer

## 2016-12-16 DIAGNOSIS — R0902 Hypoxemia: Secondary | ICD-10-CM

## 2016-12-16 MED ORDER — SODIUM CHLORIDE 0.9 % IV SOLN
INTRAVENOUS | Status: DC
  Administered 2016-12-16: 21:00:00 via INTRAVENOUS

## 2016-12-16 NOTE — Progress Notes (Signed)
Consent obtained per IV team RN request.  MD (Dr. Doreene Burke) notified via text page regarding need for nephrology approval prior to PICC line placement. Stacey Drain

## 2016-12-16 NOTE — Progress Notes (Signed)
Patient ID: [name] [name]., male   DOB: June 08, 1980, 36 y.o.   MRN: [id number] Subjective:  Patient seen lying quietly in bed, lost his IV line last night and he had 2 unsuccessful attempts at reestablishing IV access this morning. He is still in pain but no fever, no headache, no palpitation, he denies any chest pain. He tolerates PO intake, drinking water.   Objective:  Vital signs in last 24 hours:  Vitals:   12/16/16 0429 12/16/16 0437 12/16/16 1342 12/16/16 1400  BP:  125/77 111/73   Pulse: (!) 110 (!) 109 (!) 114   Resp:  16 18   Temp:  98.4 F (36.9 C) 98.6 F (37 C)   TempSrc:  Oral Oral   SpO2:  92% (!) 86% 95%  Weight:      Height:        Intake/Output from previous day:   Intake/Output Summary (Last 24 hours) at 12/16/16 1556 Last data filed at 12/16/16 0845  Gross per 24 hour  Intake              720 ml  Output              800 ml  Net              -80 ml    Physical Exam: General: Alert, awake, oriented x3, in no acute distress.  HEENT: [place]/AT PEERL, EOMI Neck: Trachea midline,  no masses, no thyromegal,y no JVD, no carotid bruit OROPHARYNX:  Moist, No exudate/ erythema/lesions.  Heart: Regular rate and rhythm, without murmurs, rubs, gallops, PMI non-displaced, no heaves or thrills on palpation.  Lungs: Clear to auscultation, no wheezing or rhonchi noted. No increased vocal fremitus resonant to percussion  Abdomen: Soft, nontender, nondistended, positive bowel sounds, no masses no hepatosplenomegaly noted..  Neuro: No focal neurological deficits noted cranial nerves II through XII grossly intact. DTRs 2+ bilaterally upper and lower extremities. Strength 5 out of 5 in bilateral upper and lower extremities. Musculoskeletal: No warm swelling or erythema around joints, no spinal tenderness noted. Psychiatric: Patient alert and oriented x3, good insight and cognition, good recent to remote recall. Lymph node survey: No cervical axillary or inguinal  lymphadenopathy noted.  Lab Results:  Basic Metabolic Panel:    Component Value Date/Time   NA 132 (L) 12/15/2016 0652   K 5.3 (H) 12/15/2016 0652   CL 107 12/15/2016 0652   CO2 15 (L) 12/15/2016 0652   BUN 31 (H) 12/15/2016 0652   CREATININE 2.13 (H) 12/15/2016 0652   CREATININE 1.45 (H) 05/12/2014 1430   GLUCOSE 104 (H) 12/15/2016 0652   CALCIUM 9.1 12/15/2016 0652   CBC:    Component Value Date/Time   WBC 16.6 (H) 12/15/2016 1019   HGB 8.2 (L) 12/15/2016 1019   HCT 21.5 (L) 12/15/2016 1019   HCT 23.5 (L) 10/15/2016 0013   PLT 105 (L) 12/15/2016 1019   MCV 105.9 (H) 12/15/2016 1019   NEUTROABS 10.6 (H) 12/15/2016 1019   LYMPHSABS 3.7 12/15/2016 1019   MONOABS 2.3 (H) 12/15/2016 1019   EOSABS 0.0 12/15/2016 1019   BASOSABS 0.0 12/15/2016 1019    No results found for this or any previous visit (from the past 240 hour(s)).  Studies/Results: Dg Chest 2 View  Result Date: 12/15/2016 CLINICAL DATA:  36 year old male with hypoxia and lethargy patient's history of sickle cell disease. EXAM: CHEST  2 VIEW COMPARISON:  12/13/2016 and priors. FINDINGS: The heart size and mediastinal contours are within normal  limits. Prominent, chronic interstitial prominence is again noted. No focal opacities, pleural effusion or pneumothorax. 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 The visualized skeletal structures are otherwise unremarkable. IMPRESSION: 1. No acute intrathoracic process. 2. Stable, chronic interstitial prominence. 3. Stable, chronic avascular necrosis of the right humeral head. Electronically Signed   By: Sande Brothers M.D.   On: 12/15/2016 12:02    Medications: Scheduled Meds: . enoxaparin (LOVENOX) injection  40 mg Subcutaneous Q24H  . HYDROmorphone   Intravenous Q4H  .  HYDROmorphone (DILAUDID) injection  1 mg Subcutaneous Q2H  . hydroxyurea  1,500 mg Oral Daily  . nortriptyline  25 mg Oral QHS  . oxyCODONE  10 mg Oral Q6H  . senna-docusate  1  tablet Oral BID  . sodium bicarbonate  650 mg Oral TID   Continuous Infusions: . sodium chloride     PRN Meds:.diphenhydrAMINE **OR** [DISCONTINUED] diphenhydrAMINE (BENADRYL) IVPB(SICKLE CELL ONLY), MUSCLE RUB, naloxone **AND** sodium chloride flush, ondansetron (ZOFRAN) IV, polyethylene glycol  Assessment/Plan: Active Problems:   Vaso-occlusive sickle cell crisis (HCC)   1. Acute Kidney Injury from Dehydration: Patient lost IV site and has not had IVF for over 6 hours. Patient initially refused PICC line, but he said he was not educated on the need for PICC line and how long he will have it for. Now he is willing to have PICC line inserted for rehydration and proper management of sickle cell crisis and anemia. At the time of last hospitalization on 10/18/2016, his Cr was 1.09 after hydration. Patient has not seen a Nephrologist in over 2 years, unsure of baseline renal function.  2. Hyperkalemia: He has a mild increase in potassium which is likely due to decreased renal excretion and metabolic acidosis. Will re-assess after increase in IVF. Treat as warranted.  3. Hb SS with crisis: Will manage with weight based Dilaudid PCA, Continue Oxycodone 10 mg scheduled every 4 hours.  4. Metabolic Acidosis: Pt has low serum bicarbonate, will add bicarbonate into fluids.  5. Anemia of Chronic Disease: Hb at baseline. Continue Hydrea.    Code Status: Full Code Family Communication: N/A Disposition Plan: Not yet ready for discharge  [name] [name]  If 7PM-7AM, please contact night-coverage.  12/16/2016, 3:56 PM  LOS: 3 days

## 2016-12-16 NOTE — Progress Notes (Signed)
Spoke with Dr Doreene Burke re PICC once cleared by nephrology.  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

## 2016-12-16 NOTE — Progress Notes (Signed)
Spoke with Brien Mates RN re PICC line placement.  Hinton Dyer to obtain consent, if pt agreeable, Hinton Dyer will contact MD re nephrology approval for PICC placement or midline placement.

## 2016-12-17 ENCOUNTER — Inpatient Hospital Stay (HOSPITAL_COMMUNITY): Payer: Medicare Other

## 2016-12-17 ENCOUNTER — Other Ambulatory Visit: Payer: Self-pay

## 2016-12-17 DIAGNOSIS — E860 Dehydration: Secondary | ICD-10-CM

## 2016-12-17 HISTORY — PX: IR US GUIDE VASC ACCESS RIGHT: IMG2390

## 2016-12-17 HISTORY — PX: IR FLUORO GUIDE CV LINE RIGHT: IMG2283

## 2016-12-17 LAB — COMPREHENSIVE METABOLIC PANEL
ALT: 37 U/L (ref 17–63)
ANION GAP: 12 (ref 5–15)
AST: 86 U/L — AB (ref 15–41)
Albumin: 3.2 g/dL — ABNORMAL LOW (ref 3.5–5.0)
Alkaline Phosphatase: 187 U/L — ABNORMAL HIGH (ref 38–126)
BILIRUBIN TOTAL: 4.6 mg/dL — AB (ref 0.3–1.2)
BUN: 46 mg/dL — AB (ref 6–20)
CHLORIDE: 105 mmol/L (ref 101–111)
CO2: 13 mmol/L — ABNORMAL LOW (ref 22–32)
Calcium: 8.9 mg/dL (ref 8.9–10.3)
Creatinine, Ser: 2.11 mg/dL — ABNORMAL HIGH (ref 0.61–1.24)
GFR, EST AFRICAN AMERICAN: 46 mL/min — AB (ref >60)
GFR, EST NON AFRICAN AMERICAN: 39 mL/min — AB (ref >60)
Glucose, Bld: 92 mg/dL (ref 65–99)
POTASSIUM: 4.3 mmol/L (ref 3.5–5.1)
Sodium: 130 mmol/L — ABNORMAL LOW (ref 135–145)
TOTAL PROTEIN: 7.2 g/dL (ref 6.5–8.1)

## 2016-12-17 LAB — CBC WITH DIFFERENTIAL/PLATELET
Basophils Absolute: 0 10*3/uL (ref 0.0–0.1)
Basophils Relative: 0 %
EOS PCT: 0 %
Eosinophils Absolute: 0 10*3/uL (ref 0.0–0.7)
HCT: 16.3 % — ABNORMAL LOW (ref 39.0–52.0)
Hemoglobin: 6 g/dL — CL (ref 13.0–17.0)
LYMPHS ABS: 2.9 10*3/uL (ref 0.7–4.0)
Lymphocytes Relative: 18 %
MCH: 37.7 pg — ABNORMAL HIGH (ref 26.0–34.0)
MCHC: 36.8 g/dL — AB (ref 30.0–36.0)
MCV: 102.5 fL — AB (ref 78.0–100.0)
MONO ABS: 1.8 10*3/uL — AB (ref 0.1–1.0)
Monocytes Relative: 11 %
NEUTROS ABS: 11.6 10*3/uL — AB (ref 1.7–7.7)
Neutrophils Relative %: 71 %
PLATELETS: 131 10*3/uL — AB (ref 150–400)
RBC: 1.59 MIL/uL — AB (ref 4.22–5.81)
RDW: 23.4 % — AB (ref 11.5–15.5)
WBC: 16.3 10*3/uL — AB (ref 4.0–10.5)

## 2016-12-17 LAB — CBC
HCT: 18.8 % — ABNORMAL LOW (ref 39.0–52.0)
Hemoglobin: 7 g/dL — ABNORMAL LOW (ref 13.0–17.0)
MCH: 38 pg — ABNORMAL HIGH (ref 26.0–34.0)
MCHC: 37.2 g/dL — ABNORMAL HIGH (ref 30.0–36.0)
MCV: 102.2 fL — ABNORMAL HIGH (ref 78.0–100.0)
Platelets: 123 10*3/uL — ABNORMAL LOW (ref 150–400)
RBC: 1.84 MIL/uL — AB (ref 4.22–5.81)
RDW: 22.2 % — AB (ref 11.5–15.5)
WBC: 14 10*3/uL — AB (ref 4.0–10.5)

## 2016-12-17 LAB — PREPARE RBC (CROSSMATCH)

## 2016-12-17 MED ORDER — SODIUM CHLORIDE 0.9% FLUSH
10.0000 mL | INTRAVENOUS | Status: DC | PRN
Start: 2016-12-17 — End: 2016-12-21
  Administered 2016-12-20: 10 mL
  Filled 2016-12-17: qty 40

## 2016-12-17 MED ORDER — SODIUM BICARBONATE 8.4 % IV SOLN
100.0000 meq | Freq: Once | INTRAVENOUS | Status: DC
  Filled 2016-12-17: qty 100

## 2016-12-17 MED ORDER — FENTANYL CITRATE (PF) 100 MCG/2ML IJ SOLN
INTRAMUSCULAR | Status: AC | PRN
  Administered 2016-12-17: 50 ug via INTRAVENOUS

## 2016-12-17 MED ORDER — LIDOCAINE HCL 1 % IJ SOLN
INTRAMUSCULAR | Status: AC | PRN
  Administered 2016-12-17: 5 mL via INTRADERMAL

## 2016-12-17 MED ORDER — SODIUM CHLORIDE 0.45 % IV BOLUS
1000.0000 mL | Freq: Once | INTRAVENOUS | Status: AC
  Administered 2016-12-17: 1000 mL via INTRAVENOUS

## 2016-12-17 MED ORDER — ENOXAPARIN SODIUM 40 MG/0.4ML ~~LOC~~ SOLN
40.0000 mg | SUBCUTANEOUS | Status: DC
  Administered 2016-12-17 – 2016-12-20 (×4): 40 mg via SUBCUTANEOUS
  Filled 2016-12-17 (×4): qty 0.4

## 2016-12-17 MED ORDER — LIDOCAINE HCL 1 % IJ SOLN
INTRAMUSCULAR | Status: AC
Start: 2016-12-17 — End: 2016-12-17
  Filled 2016-12-17: qty 20

## 2016-12-17 MED ORDER — HYDROMORPHONE HCL 1 MG/ML IJ SOLN: 1.0000 mg | INTRAMUSCULAR | Status: DC | PRN

## 2016-12-17 MED ORDER — FENTANYL CITRATE (PF) 100 MCG/2ML IJ SOLN
INTRAMUSCULAR | Status: AC
  Filled 2016-12-17: qty 4

## 2016-12-17 MED ORDER — SODIUM CHLORIDE 0.9 % IV SOLN: Freq: Once | INTRAVENOUS | Status: DC

## 2016-12-17 MED ORDER — SODIUM BICARBONATE 8.4 % IV SOLN
100.0000 meq | Freq: Once | INTRAVENOUS | Status: AC
  Administered 2016-12-17: 100 meq via INTRAVENOUS
  Filled 2016-12-17: qty 100

## 2016-12-17 MED ORDER — MIDAZOLAM HCL 2 MG/2ML IJ SOLN
INTRAMUSCULAR | Status: AC
Start: 2016-12-17 — End: 2016-12-17
  Filled 2016-12-17: qty 6

## 2016-12-17 MED ORDER — ENOXAPARIN SODIUM 30 MG/0.3ML ~~LOC~~ SOLN: 30.0000 mg | SUBCUTANEOUS | Status: DC

## 2016-12-17 MED ORDER — MIDAZOLAM HCL 2 MG/2ML IJ SOLN
INTRAMUSCULAR | Status: AC | PRN
  Administered 2016-12-17: 1 mg via INTRAVENOUS

## 2016-12-17 NOTE — Procedures (Signed)
RIJV tunneled PICC SVC RA EBL 0 Comp 0

## 2016-12-17 NOTE — Progress Notes (Signed)
Patient lost IV access. Received 3.5mg  of IV dilaudid through PCA. The remaining medication in the syringe equaled 20mg . This was wasted in the sink witnessed by [facility]ore. The IV tubing was discarded in black bin. Empty syringe placed in sharps container.

## 2016-12-17 NOTE — Progress Notes (Addendum)
CRITICAL VALUE ALERT  Critical Value:  Hemoglobin 6  Date & Time Notied:  12/17/2016  2241  Provider Notified: Georges Mouse, MD  Orders Received/Actions taken: repeat labs

## 2016-12-17 NOTE — Progress Notes (Signed)
SPoke with Brien Mates RN re IV access.  States pt is currently in IR for PICC placement.

## 2016-12-17 NOTE — Progress Notes (Signed)
Patient ID: [name] [name]., male   DOB: April 25, 1980, 36 y.o.   MRN: [id number] Subjective:  Patient has agreed to have a PICC line, scheduled for this morning, awaiting IR. He is still in pain, rated at about 8/10, localized to his lower extremities and lower back. No fever, no chest pain, no palpitation.   Objective:  Vital signs in last 24 hours:  Vitals:   12/17/16 1115 12/17/16 1120 12/17/16 1125 12/17/16 1130  BP: 119/84 117/81 118/78 117/85  Pulse: (!) 104 (!) 108 (!) 118 (!) 113  Resp: 17 15 17 18   Temp:      TempSrc:      SpO2: 100% 100% 100% 100%  Weight:      Height:       Intake/Output from previous day:   Intake/Output Summary (Last 24 hours) at 12/17/2016 1203 Last data filed at 12/17/2016 0600 Gross per 24 hour  Intake 571.25 ml  Output 1275 ml  Net -703.75 ml   Physical Exam: General: Alert, awake, oriented x3, in no acute distress.  HEENT: /AT PEERL, EOMI Neck: Trachea midline,  no masses, no thyromegal,y no JVD, no carotid bruit OROPHARYNX:  Moist, No exudate/ erythema/lesions.  Heart: Regular rate and rhythm, without murmurs, rubs, gallops, PMI non-displaced, no heaves or thrills on palpation.  Lungs: Clear to auscultation, no wheezing or rhonchi noted. No increased vocal fremitus resonant to percussion  Abdomen: Soft, nontender, nondistended, positive bowel sounds, no masses no hepatosplenomegaly noted..  Neuro: No focal neurological deficits noted cranial nerves II through XII grossly intact. DTRs 2+ bilaterally upper and lower extremities. Strength 5 out of 5 in bilateral upper and lower extremities. Musculoskeletal: No warm swelling or erythema around joints, no spinal tenderness noted. Psychiatric: Patient alert and oriented x3, good insight and cognition, good recent to remote recall. Lymph node survey: No cervical axillary or inguinal lymphadenopathy noted.  Lab Results:  Basic Metabolic Panel:    Component Value Date/Time   NA 130 (L)  12/17/2016 0511   K 4.3 12/17/2016 0511   CL 105 12/17/2016 0511   CO2 13 (L) 12/17/2016 0511   BUN 46 (H) 12/17/2016 0511   CREATININE 2.11 (H) 12/17/2016 0511   CREATININE 1.45 (H) 05/12/2014 1430   GLUCOSE 92 12/17/2016 0511   CALCIUM 8.9 12/17/2016 0511   CBC:    Component Value Date/Time   WBC 16.3 (H) 12/17/2016 0511   HGB 6.0 (LL) 12/17/2016 0511   HCT 16.3 (L) 12/17/2016 0511   HCT 23.5 (L) 10/15/2016 0013   PLT 131 (L) 12/17/2016 0511   MCV 102.5 (H) 12/17/2016 0511   NEUTROABS 11.6 (H) 12/17/2016 0511   LYMPHSABS 2.9 12/17/2016 0511   MONOABS 1.8 (H) 12/17/2016 0511   EOSABS 0.0 12/17/2016 0511   BASOSABS 0.0 12/17/2016 0511   Medications: Scheduled Meds: . enoxaparin (LOVENOX) injection  30 mg Subcutaneous Q24H  . HYDROmorphone   Intravenous Q4H  . lidocaine      . nortriptyline  25 mg Oral QHS  . oxyCODONE  10 mg Oral Q6H  . senna-docusate  1 tablet Oral BID  . sodium bicarbonate  100 mEq Intravenous Once  . sodium bicarbonate  650 mg Oral TID   Continuous Infusions: . sodium chloride     PRN Meds:.diphenhydrAMINE **OR** [DISCONTINUED] diphenhydrAMINE (BENADRYL) IVPB(SICKLE CELL ONLY), HYDROmorphone (DILAUDID) injection, MUSCLE RUB, naloxone **AND** sodium chloride flush, ondansetron (ZOFRAN) IV, polyethylene glycol  Assessment/Plan: Active Problems:   Vasoocclusive sickle cell crisis (HCC)   Hypoxia  1.  Acute Kidney Injury from Dehydration: Patient is scheduled to have PICC line insertion by IR this morning. BUN is up to 46, BUN:Cr ratio slightly above 20, Will give IVF bolus, add bicarbonate and repeat labs in am. Fortunately, patient is asymptomatic. Continue to monitor closely  2. Hyperkalemia: K is back to normal. Treat as needed.  3. Hb SS with crisis: Will restart weight based Dilaudid PCA when IV access is reestablished, Continue Oxycodone 10 mg scheduled every 4 hours.  4. Metabolic Acidosis: Patientt has low serum bicarbonate, will add  bicarbonate into fluids.  5. Anemia of Chronic Disease: Hb is below baseline today, down to 6.0 in the setting of dehydration, will likely decrease even further after rehydration, will transfuse patient with one unit of PRBC today. Continue Hydrea.  Code Status: Full Code Family Communication: N/A Disposition Plan: Not yet ready for discharge  [name] [name]  If 7PM-7AM, please contact night-coverage.  12/17/2016, 12:03 PM  LOS: 4 days

## 2016-12-17 NOTE — Progress Notes (Signed)
Dr. Maudie Mercury paged and informed that patient lost IV access. Verbal order given for Dilaudid 1mg  subcutaneous every 2 hours as needed for severe pain. Patient currently asleep. Will continue to monitor.

## 2016-12-17 NOTE — Progress Notes (Signed)
PHARMACIST - PHYSICIAN COMMUNICATION CONCERNING:  Hydrea  Hydroxyurea will be place on hold due to P&T approved hold criteria listed below.  Patient's Hgb has been decreasing and today is 6.0.  Hydroxyurea (Hydrea) hold criteria  ANC < 2  Pltc < 80K in sickle-cell patients; < 100K in other patients  Hgb <= 6 in sickle-cell patients; < 8 in other patients  Reticulocytes < 80K when Hgb < 9  Plan:  Recommend following daily CBC and resume hydroxyurea once pt no longer meets Hold Criteria.   Hershal Coria, PharmD, BCPS Pager: [phone] 12/17/2016 11:37 AM

## 2016-12-18 ENCOUNTER — Encounter (HOSPITAL_COMMUNITY): Payer: Self-pay | Admitting: Interventional Radiology

## 2016-12-18 DIAGNOSIS — E871 Hypo-osmolality and hyponatremia: Secondary | ICD-10-CM

## 2016-12-18 DIAGNOSIS — G9341 Metabolic encephalopathy: Secondary | ICD-10-CM

## 2016-12-18 DIAGNOSIS — R222 Localized swelling, mass and lump, trunk: Secondary | ICD-10-CM

## 2016-12-18 DIAGNOSIS — R0902 Hypoxemia: Secondary | ICD-10-CM

## 2016-12-18 LAB — CBC WITH DIFFERENTIAL/PLATELET
BAND NEUTROPHILS: 2 %
Basophils Absolute: 0 10*3/uL (ref 0.0–0.1)
Basophils Relative: 0 %
Eosinophils Absolute: 0 10*3/uL (ref 0.0–0.7)
Eosinophils Relative: 0 %
HEMATOCRIT: 19.8 % — AB (ref 39.0–52.0)
HEMOGLOBIN: 7.3 g/dL — AB (ref 13.0–17.0)
LYMPHS ABS: 3.6 10*3/uL (ref 0.7–4.0)
Lymphocytes Relative: 26 %
MCH: 35.8 pg — ABNORMAL HIGH (ref 26.0–34.0)
MCHC: 36.9 g/dL — AB (ref 30.0–36.0)
MCV: 97.1 fL (ref 78.0–100.0)
Monocytes Absolute: 0.8 10*3/uL (ref 0.1–1.0)
Monocytes Relative: 6 %
NRBC: 3 /100{WBCs} — AB
Neutro Abs: 9 10*3/uL — ABNORMAL HIGH (ref 1.7–7.7)
Neutrophils Relative %: 63 %
OTHER: 3 %
Platelets: 182 10*3/uL (ref 150–400)
RBC: 2.04 MIL/uL — AB (ref 4.22–5.81)
RDW: 22.9 % — AB (ref 11.5–15.5)
WBC: 13.8 10*3/uL — AB (ref 4.0–10.5)

## 2016-12-18 LAB — COMPREHENSIVE METABOLIC PANEL
ALK PHOS: 187 U/L — AB (ref 38–126)
ALT: 30 U/L (ref 17–63)
AST: 59 U/L — AB (ref 15–41)
Albumin: 3 g/dL — ABNORMAL LOW (ref 3.5–5.0)
Anion gap: 11 (ref 5–15)
BILIRUBIN TOTAL: 6.4 mg/dL — AB (ref 0.3–1.2)
BUN: 40 mg/dL — AB (ref 6–20)
CHLORIDE: 100 mmol/L — AB (ref 101–111)
CO2: 18 mmol/L — AB (ref 22–32)
CREATININE: 1.62 mg/dL — AB (ref 0.61–1.24)
Calcium: 8.5 mg/dL — ABNORMAL LOW (ref 8.9–10.3)
GFR calc Af Amer: >60 mL/min (ref >60)
GFR, EST NON AFRICAN AMERICAN: 54 mL/min — AB (ref >60)
GLUCOSE: 107 mg/dL — AB (ref 65–99)
POTASSIUM: 3.8 mmol/L (ref 3.5–5.1)
Sodium: 129 mmol/L — ABNORMAL LOW (ref 135–145)
Total Protein: 7.3 g/dL (ref 6.5–8.1)

## 2016-12-18 LAB — RETICULOCYTES: RBC.: 2 MIL/uL — ABNORMAL LOW (ref 4.22–5.81)

## 2016-12-18 MED ORDER — SODIUM CHLORIDE 0.9 % IV SOLN
INTRAVENOUS | Status: DC
  Administered 2016-12-18 – 2016-12-19 (×3): via INTRAVENOUS

## 2016-12-18 NOTE — Progress Notes (Signed)
Pt 95% on room air at rest and 93-97% on room air while ambulating.  Weaned to room air.  Pt also refusing to wear CO2 monitor so I turned it off as well as PCA off for now.  Will resume when pt is complaint.  Will page Dr. Zigmund Daniel with update.

## 2016-12-18 NOTE — Progress Notes (Signed)
SICKLE CELL SERVICE PROGRESS NOTE  [name] [name]. YQM:[id number] DOB: 1980/08/01 DOA: 12/13/2016 PCP: Billee Cashing, MD  Assessment/Plan: Active Problems:   Vasoocclusive sickle cell crisis (HCC)   Hypoxia  1. Metabolic Encephalopathy: Resolved now that is metabolic acidosis and dehydration have improved.  2. Hyponatremia: Continue IVF.  3. Metabolic Acidosis: he received an amp of bicarbonate yesterday. Will continue oral Sodium Bicarbonate and increase IVF rate. Re-evaluate tomorrow.  4. Hypoxia:Etiology unclear. It may be secondary to shallow breathing associated with pain. However with the accompanying tachycardia cannot exclude developing acute chest syndrome. Will obtain CXR.  5. Tachycardia: Resolved.  6. Dehydration: Improved after PIV access restored and a IVF received. Continue IVF.  7. AKI in setting of CKD II: At the time of last hospitalization on 10/18/2016, his Cr was 1.09 after hydration. I do not have records available from his Nephrologist and so unsure of baseline renal function. He has had improvement from peak Cr which was 2.13 on 11/2. Cr 1.62 today. 8. Poor PIV Access: Pt  had PICC line placed by IR yesterday.  9. Hyperkalemia: resolved.  10. Hb SS with crisis: Continue PCA bolus of 0.5 mg, Continue Oxycodone 10 mg scheduled every 4 hours. No NSAID's due to CKD.  11. Anemia of Chronic Disease: Hb at baseline. Continue Hydrea.    Code Status: Full Code Family Communication:Friend at bedside. Disposition Plan: Not yet ready for discharge  Zaydah Nawabi A.  Pager [phone]. If 7PM-7AM, please contact night-coverage.  12/18/2016, 12:14 PM  LOS: 5 days   Interim History: [name] [name] is fully awake and back to his baseline of mentation. He states that he does not recall the periods of blank stares last week that was different from his baseline. He denies any seizure activity in the past or a family h/o Seizure disorder.  He is concerned about an area of  pain and swelling on the chest wall in the left infra-clavicular area. He states that the pain of SCD is localized only to his low back and is tolerable and not really an issue for him at this time.    Consultants:  None  Procedures:  None  Antibiotics:  None   Objective: Vitals:   12/17/16 2348 12/18/16 0201 12/18/16 0340 12/18/16 0800  BP:  132/84    Pulse:  99    Resp: 15 15 16 18   Temp:  98.2 F (36.8 C)    TempSrc:  Oral    SpO2: 96% 98% 95% 95%  Weight:      Height:       Weight change:   Intake/Output Summary (Last 24 hours) at 12/18/2016 1214 Last data filed at 12/18/2016 0342 Gross per 24 hour  Intake 1260 ml  Output 2075 ml  Net -815 ml      Physical Exam  BP 132/84 (BP Location: Left Arm)   Pulse 99   Temp 98.2 F (36.8 C) (Oral)   Resp 18   Ht 5\' 3"  (1.6 m)   Wt 53.4 kg (117 lb 11.2 oz)   SpO2 95%   BMI 20.85 kg/m   General: Alert, awake, oriented x3, in no apparent distress.  HEENT: [place]/AT PEERL, EOMI, anicteric Neck: Trachea midline,  no masses, no thyromegal,y no JVD, no carotid bruit OROPHARYNX:  Moist, No exudate/ erythema/lesions.  Heart: Regular rate and rhythm, II/VI non-radiating systolic ejection murmur at base. No rubs or gallops, PMI non-displaced, no heaves or thrills on palpation.  Lungs: Clear to auscultation, no wheezing or  rhonchi noted. No increased vocal fremitus resonant to percussion  Abdomen: Soft, nontender, nondistended, positive bowel sounds, no masses no hepatosplenomegaly noted.  Neuro: No focal neurological deficits noted cranial nerves II through XII grossly intact. Strength at baseline in bilateral upper and lower extremities. Musculoskeletal: No warmth swelling or erythema around joints, no spinal tenderness noted. Area of tenderness and swelling noted in the left infraclavicular area.  Psychiatric: Patient alert and oriented x3, he has a normal affect. Good insight and cognition.   Data Reviewed: Basic Metabolic  Panel: Recent Labs  Lab 12/13/16 1311 12/14/16 1224 12/15/16 0652 12/17/16 0511 12/18/16 0340  NA 136 130* 132* 130* 129*  K 5.0 5.5* 5.3* 4.3 3.8  CL 109 105 107 105 100*  CO2 20* 16* 15* 13* 18*  GLUCOSE 104* 103* 104* 92 107*  BUN 16 23* 31* 46* 40*  CREATININE 1.94* 1.86* 2.13* 2.11* 1.62*  CALCIUM 8.7* 8.6* 9.1 8.9 8.5*   Liver Function Tests: Recent Labs  Lab 12/13/16 1311 12/17/16 0511 12/18/16 0340  AST 68* 86* 59*  ALT 51 37 30  ALKPHOS 186* 187* 187*  BILITOT 4.3* 4.6* 6.4*  PROT 7.4 7.2 7.3  ALBUMIN 4.1 3.2* 3.0*   No results for input(s): LIPASE, AMYLASE in the last 168 hours. No results for input(s): AMMONIA in the last 168 hours. CBC: Recent Labs  Lab 12/13/16 1311 12/15/16 1019 12/17/16 0511 12/17/16 1201 12/18/16 0340  WBC 15.0* 16.6* 16.3* 14.0* 13.8*  NEUTROABS 9.5* 10.6* 11.6*  --  9.0*  HGB 9.8* 8.2* 6.0* 7.0* 7.3*  HCT 27.3* 21.5* 16.3* 18.8* 19.8*  MCV 111.9* 105.9* 102.5* 102.2* 97.1  PLT 129* 105* 131* 123* 182   Cardiac Enzymes: Recent Labs  Lab 12/13/16 1311  TROPONINI <0.03   BNP (last 3 results) No results for input(s): BNP in the last 8760 hours.  ProBNP (last 3 results) No results for input(s): PROBNP in the last 8760 hours.  CBG: No results for input(s): GLUCAP in the last 168 hours.  No results found for this or any previous visit (from the past 240 hour(s)).   Studies: Dg Chest 2 View  Result Date: 12/15/2016 CLINICAL DATA:  36 year old male with hypoxia and lethargy patient's history of sickle cell disease. EXAM: CHEST  2 VIEW COMPARISON:  12/13/2016 and priors. FINDINGS: The heart size and mediastinal contours are within normal limits. Prominent, chronic interstitial prominence is again noted. No focal opacities, pleural effusion or pneumothorax. 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 The visualized skeletal structures are otherwise unremarkable. IMPRESSION: 1. No acute intrathoracic  process. 2. Stable, chronic interstitial prominence. 3. Stable, chronic avascular necrosis of the right humeral head. Electronically Signed   By: Sande Brothers M.D.   On: 12/15/2016 12:02   Dg Chest 2 View  Result Date: 12/13/2016 CLINICAL DATA:  Sickle cell disease with cough and chest pain EXAM: CHEST  2 VIEW COMPARISON:  October 17, 2014 FINDINGS: Lungs are clear. Heart size and pulmonary vascularity are normal. No adenopathy. There is sclerosis in the proximal right humeral head with changes suggesting a degree of avascular necrosis. IMPRESSION: Findings felt to represent a degree of avascular necrosis in the right humeral head. No edema or consolidation. Stable cardiac silhouette. Electronically Signed   By: Bretta Bang III M.D.   On: 12/13/2016 13:57   Ir Fluoro Guide Cv Line Right  Result Date: 12/18/2016 INDICATION: Sickle cell disorder.  No IV access. EXAM: TUNNELED RIGHT JUGULAR PICC LINE PLACEMENT WITH  ULTRASOUND AND FLUOROSCOPIC GUIDANCE MEDICATIONS: None ANESTHESIA/SEDATION: Versed 1 mg IV; Fentanyl 50 mcg IV; Moderate Sedation Time:  10 The patient was continuously monitored during the procedure by the interventional radiology nurse under my direct supervision. FLUOROSCOPY TIME:  Fluoroscopy Time:  minutes 42 seconds (2.9 mGy). COMPLICATIONS: None immediate. PROCEDURE: The patient was advised of the possible risks and complications and agreed to undergo the procedure. The patient was then brought to the angiographic suite for the procedure. The right neck was prepped with chlorhexidine, draped in the usual sterile fashion using maximum barrier technique (cap and mask, sterile gown, sterile gloves, large sterile sheet, hand hygiene and cutaneous antiseptic). Local anesthesia was attained by infiltration with 1% lidocaine. Ultrasound demonstrated patency of the right jugular vein, and this was documented with an image. Under real-time ultrasound guidance, this vein was accessed with a 21  gauge micropuncture needle and image documentation was performed. The needle was exchanged over a guidewire for a peel-away sheath through which a 20 cm 5 [place] single lumen power injectable PICC was advanced, and positioned with its tip at the lower SVC/right atrial junction. The cuff was positioned in the subcutaneous tract. Fluoroscopy during the procedure and fluoro spot radiograph confirms appropriate catheter position. The catheter was flushed, secured to the skin with Prolene sutures, and covered with a sterile dressing. IMPRESSION: Successful placement of a tunneled right jugular PICC with sonographic and fluoroscopic guidance. The catheter is ready for use. Electronically Signed   By: Jolaine Click M.D.   On: 12/18/2016 10:39   Ir US Guide Vasc Access Right  Result Date: 12/18/2016 INDICATION: Sickle cell disorder.  No IV access. EXAM: TUNNELED RIGHT JUGULAR PICC LINE PLACEMENT WITH ULTRASOUND AND FLUOROSCOPIC GUIDANCE MEDICATIONS: None ANESTHESIA/SEDATION: Versed 1 mg IV; Fentanyl 50 mcg IV; Moderate Sedation Time:  10 The patient was continuously monitored during the procedure by the interventional radiology nurse under my direct supervision. FLUOROSCOPY TIME:  Fluoroscopy Time:  minutes 42 seconds (2.9 mGy). COMPLICATIONS: None immediate. PROCEDURE: The patient was advised of the possible risks and complications and agreed to undergo the procedure. The patient was then brought to the angiographic suite for the procedure. The right neck was prepped with chlorhexidine, draped in the usual sterile fashion using maximum barrier technique (cap and mask, sterile gown, sterile gloves, large sterile sheet, hand hygiene and cutaneous antiseptic). Local anesthesia was attained by infiltration with 1% lidocaine. Ultrasound demonstrated patency of the right jugular vein, and this was documented with an image. Under real-time ultrasound guidance, this vein was accessed with a 21 gauge micropuncture needle and  image documentation was performed. The needle was exchanged over a guidewire for a peel-away sheath through which a 20 cm 5 [place] single lumen power injectable PICC was advanced, and positioned with its tip at the lower SVC/right atrial junction. The cuff was positioned in the subcutaneous tract. Fluoroscopy during the procedure and fluoro spot radiograph confirms appropriate catheter position. The catheter was flushed, secured to the skin with Prolene sutures, and covered with a sterile dressing. IMPRESSION: Successful placement of a tunneled right jugular PICC with sonographic and fluoroscopic guidance. The catheter is ready for use. Electronically Signed   By: Jolaine Click M.D.   On: 12/18/2016 10:39    Scheduled Meds: . enoxaparin (LOVENOX) injection  40 mg Subcutaneous Q24H  . HYDROmorphone   Intravenous Q4H  . nortriptyline  25 mg Oral QHS  . oxyCODONE  10 mg Oral Q6H  . senna-docusate  1 tablet Oral BID  .  sodium bicarbonate  650 mg Oral TID   Continuous Infusions: . sodium chloride 100 mL/hr at 12/18/16 1000    Active Problems:   Vasoocclusive sickle cell crisis (HCC)   Hypoxia    In excess of 40 minutes spent during this visit. Greater than 50% involved face to face contact with the patient for assessment, counseling and coordination of care.

## 2016-12-18 NOTE — Plan of Care (Signed)
C/o pain 9/10 to back, legs and chest.  PCA effective.   Will continue with plan of care and continue to monitor.

## 2016-12-18 NOTE — Consult Note (Signed)
Chief Complaint: Patient was seen in consultation today for  Chief Complaint  Patient presents with  . Sickle Cell Pain Crisis   at the request of * No referring provider recorded for this case *  Referring Physician(s): * No referring provider recorded for this case *  Supervising Physician: Marybelle Killings  Patient Status: [facility] - In-pt  History of Present Illness: [name] [name]. is a 36 y.o. male with sickle cell disorder and no IV access.  Past Medical History:  Diagnosis Date  . Bacterial pneumonia ~ 2012   "caught it here in the hospital" (09/25/2012)  . Chronic kidney disease    "from my sickle cell" (09/25/2012)  . Cyst of brain    "2 really small ones in the back of my head; inside; saw them w/MRI" (09/25/2012)  . GERD (gastroesophageal reflux disease)    "after I eat alot of spicey foods" (09/25/2012)  . Gynecomastia, male 07/10/2012  . History of blood transfusion    "always related to sickle cell crisis" (09/25/2012)  . Migraines    "take RX qd to prevent them" (09/25/2012)  . Sickle cell anemia (HCC)   . Sickle cell crisis ([place]) 09/25/2012  . Sickle cell nephropathy ([place]) 07/10/2012  . Tachycardia with heart rate 121-140 beats per minute with ambulation 08/04/2016    Past Surgical History:  Procedure Laterality Date  . CHOLECYSTECTOMY  ~ 2012    Allergies: Nsaids and Morphine and related  Medications: Prior to Admission medications   Medication Sig Start Date End Date Taking? Authorizing Provider  acetaminophen (TYLENOL) 325 MG tablet Take 650 mg by mouth 2 (two) times daily as needed for moderate pain.   Yes [provider]  hydroxyurea (HYDREA) 500 MG capsule TAKE 3 CAPSULES (1,500 MG TOTAL) BY MOUTH DAILY. 08/23/16  Yes Donnamae Jude, MD  nortriptyline (PAMELOR) 25 MG capsule TAKE 1 CAPSULE (25 MG TOTAL) BY MOUTH AT BEDTIME. 12/28/15  Yes Donnamae Jude, MD  Oxycodone HCl 10 MG TABS Take 10 mg by mouth every 6 (six) hours as needed.   Yes  [provider]  sodium bicarbonate 650 MG tablet Take 1 tablet (650 mg total) by mouth 2 (two) times daily. Patient not taking: Reported on 10/14/2016 08/04/16   Leana Gamer, MD     Family History  Problem Relation Age of Onset  . Breast cancer Mother     Social History   Socioeconomic History  . Marital status: Single    Spouse name: None  . Number of children: None  . Years of education: None  . Highest education level: None  Social Needs  . Financial resource strain: None  . Food insecurity - worry: None  . Food insecurity - inability: None  . Transportation needs - medical: None  . Transportation needs - non-medical: None  Occupational History  . None  Tobacco Use  . Smoking status: Current Some Day Smoker    Types: Cigarettes  . Smokeless tobacco: Never Used  . Tobacco comment: 09/25/2012 "I don't buy cigarettes; bum one from friends q now and then"  Substance and Sexual Activity  . Alcohol use: Yes    Alcohol/week: 0.0 oz    Comment: occasionally  . Drug use: Yes    Types: Marijuana    Comment: 09/25/2012 "weed was my biggest problem; stopped that ~ 1 1/2 months ago; I don't do cocaine; can't say I never have though"  . Sexual activity: Yes  Other Topics Concern  . None  Social History Narrative    Lives alone in [place].   Back at school, studying Public relations account executive. Unemployed.    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       Review of Systems: A 12 point ROS discussed and pertinent positives are indicated in the HPI above.  All other systems are negative.  Review of Systems  Vital Signs: BP 132/84 (BP Location: Left Arm)   Pulse 99   Temp 98.2 F (36.8 C) (Oral)   Resp 18   Ht 5\' 3"  (1.6 m)   Wt 117 lb 11.2 oz (53.4 kg)   SpO2 95%   BMI 20.85 kg/m   Physical Exam  Constitutional: He  is oriented to person, place, and time. He appears well-developed and well-nourished.  Pulmonary/Chest: Effort normal.  Neurological: He is alert and oriented to person, place, and time.  Skin: Skin is warm and dry.    Imaging: Dg Chest 2 View  Result Date: 12/15/2016 CLINICAL DATA:  36 year old male with hypoxia and lethargy patient's history of sickle cell disease. EXAM: CHEST  2 VIEW COMPARISON:  12/13/2016 and priors. FINDINGS: The heart size and mediastinal contours are within normal limits. Prominent, chronic interstitial prominence is again noted. No focal opacities, pleural effusion or pneumothorax. 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 The visualized skeletal structures are otherwise unremarkable. IMPRESSION: 1. No acute intrathoracic process. 2. Stable, chronic interstitial prominence. 3. Stable, chronic avascular necrosis of the right humeral head. Electronically Signed   By: Kristopher Oppenheim M.D.   On: 12/15/2016 12:02   Dg Chest 2 View  Result Date: 12/13/2016 CLINICAL DATA:  Sickle cell disease with cough and chest pain EXAM: CHEST  2 VIEW COMPARISON:  October 17, 2014 FINDINGS: Lungs are clear. Heart size and pulmonary vascularity are normal. No adenopathy. There is sclerosis in the proximal right humeral head with changes suggesting a degree of avascular necrosis. IMPRESSION: Findings felt to represent a degree of avascular necrosis in the right humeral head. No edema or consolidation. Stable cardiac silhouette. Electronically Signed   By: Lowella Grip III M.D.   On: 12/13/2016 13:57    Labs:  CBC: Recent Labs    12/15/16 1019 12/17/16 0511 12/17/16 1201 12/18/16 0340  WBC 16.6* 16.3* 14.0* 13.8*  HGB 8.2* 6.0* 7.0* 7.3*  HCT 21.5* 16.3* 18.8* 19.8*  PLT 105* 131* 123* 182    COAGS: No results for input(s): INR, APTT in the last 8760 hours.  BMP: Recent Labs    12/14/16 1224 12/15/16 0652 12/17/16 0511 12/18/16 0340  NA 130*  132* 130* 129*  K 5.5* 5.3* 4.3 3.8  CL 105 107 105 100*  CO2 16* 15* 13* 18*  GLUCOSE 103* 104* 92 107*  BUN 23* 31* 46* 40*  CALCIUM 8.6* 9.1 8.9 8.5*  CREATININE 1.86* 2.13* 2.11* 1.62*  GFRNONAA 46* 39* 39* 54*  GFRAA 53* 45* 46* >60    LIVER FUNCTION TESTS: Recent Labs    10/15/16 0013 12/13/16 1311 12/17/16 0511 12/18/16 0340  BILITOT 4.3* 4.3* 4.6* 6.4*  AST 90* 68* 86* 59*  ALT 64* 51 37 30  ALKPHOS 138* 186* 187* 187*  PROT 7.8 7.4 7.2 7.3  ALBUMIN 4.1 4.1 3.2* 3.0*    TUMOR MARKERS: No results for input(s): AFPTM, CEA, CA199,  [place] in the last 8760 hours.  Assessment and Plan:  Patient is to get tunneled RIJV PICC with sedation for IV access.  Thank you for this interesting consult.  I greatly enjoyed meeting [name] [name]. and look forward to participating in their care.  A copy of this report was sent to the requesting provider on this date.  Electronically Signed: Yeriel Mineo, ART A, MD 12/18/2016, 10:35 AM   I spent a total of 20 Minutes  in face to face in clinical consultation, greater than 50% of which was counseling/coordinating care for PICC line placement.

## 2016-12-19 ENCOUNTER — Inpatient Hospital Stay (HOSPITAL_COMMUNITY): Payer: Medicare Other

## 2016-12-19 DIAGNOSIS — F1190 Opioid use, unspecified, uncomplicated: Secondary | ICD-10-CM

## 2016-12-19 DIAGNOSIS — E876 Hypokalemia: Secondary | ICD-10-CM

## 2016-12-19 LAB — RENAL FUNCTION PANEL
Albumin: 2.9 g/dL — ABNORMAL LOW (ref 3.5–5.0)
Anion gap: 10 (ref 5–15)
BUN: 33 mg/dL — ABNORMAL HIGH (ref 6–20)
CALCIUM: 8.5 mg/dL — AB (ref 8.9–10.3)
CO2: 18 mmol/L — ABNORMAL LOW (ref 22–32)
CREATININE: 1.45 mg/dL — AB (ref 0.61–1.24)
Chloride: 103 mmol/L (ref 101–111)
Glucose, Bld: 121 mg/dL — ABNORMAL HIGH (ref 65–99)
Phosphorus: 2.9 mg/dL (ref 2.5–4.6)
Potassium: 3.1 mmol/L — ABNORMAL LOW (ref 3.5–5.1)
SODIUM: 131 mmol/L — AB (ref 135–145)

## 2016-12-19 LAB — MAGNESIUM: MAGNESIUM: 2.2 mg/dL (ref 1.7–2.4)

## 2016-12-19 MED ORDER — HYDROXYUREA 500 MG PO CAPS: 1500.0000 mg | ORAL_CAPSULE | Freq: Every day | ORAL | Status: DC

## 2016-12-19 MED ORDER — HYDROXYUREA 500 MG PO CAPS
1000.0000 mg | ORAL_CAPSULE | Freq: Every day | ORAL | Status: DC
  Administered 2016-12-19 – 2016-12-20 (×2): 1000 mg via ORAL
  Filled 2016-12-19 (×2): qty 2

## 2016-12-19 MED ORDER — OXYCODONE HCL 5 MG PO TABS
10.0000 mg | ORAL_TABLET | ORAL | Status: DC
  Administered 2016-12-19 – 2016-12-21 (×12): 10 mg via ORAL
  Filled 2016-12-19 (×12): qty 2

## 2016-12-19 MED ORDER — POTASSIUM CHLORIDE CRYS ER 20 MEQ PO TBCR
40.0000 meq | EXTENDED_RELEASE_TABLET | ORAL | Status: AC
  Administered 2016-12-19 (×2): 40 meq via ORAL
  Filled 2016-12-19 (×2): qty 2

## 2016-12-19 NOTE — Progress Notes (Signed)
Patient Saturations on Room Air at Rest = 94%. Heart Rate 92  Patient Saturations on Room Air while Ambulating = 96%. Heart Rate 120   Doctor Zigmund Daniel Aware.

## 2016-12-19 NOTE — Progress Notes (Signed)
SICKLE CELL SERVICE PROGRESS NOTE  [name] [name]. KYH:[id number] DOB: [birth year] DOA: 12/13/2016 PCP: Billee Cashing, MD  Assessment/Plan: Active Problems:   Anemia of chronic disease   Chronic, continuous use of opioids   Sickle cell nephropathy (HCC)   Metabolic acidosis   Sickle cell crisis (HCC)   Acute kidney injury superimposed on chronic kidney disease (HCC)   Hyponatremia   Vasoocclusive sickle cell crisis (HCC)   Hypoxia   Soft tissue swelling of chest wall  1. Hyponatremia: Improved. Continue IVF.  2. Hypokalemia: Likely due to increased renal excretion now that kidney function back to baseline. Will replace orally.  3. Soft Tissue Swelling: Pt has refused to use the warm packs stating that "it's not working". 1-view CXR ordered at patients insistence. However from an objective perspective, tenderness and swelling have decreased since yesterday. Will consider a CT without contrast ( to avoid contrast given his CKD) if pain and swelling persists in that area.  4. Dehydration: Improved. Will decrease IVF to [facility].  5. Metabolic Acidosis: he received an amp of bicarbonate 2 days ago. Serum bicarbonate stable at 18 and unchanged from yesterday. Continue oral bicarbonate. 6. AKI in setting of CKD II: At the time of last hospitalization on 10/18/2016, his Cr was 1.09 after hydration. I do not have records available from his Nephrologist and so unsure of baseline renal function. He has had improvement from peak Cr which was 2.13 on 11/2 and is improved to day to 1.45. Will discontinue IVF. 7. Hb SS with crisis: Continue PCA bolus of 0.5 mg, Continue Oxycodone 10 mg scheduled every 4 hours. No NSAID's due to CKD.  8. Anemia of Chronic Disease: Hb at baseline. Continue Hydrea.   9. Poor PIV Access: Pt  had PICC line placed by IR on Sunday. 10. Hypoxia:Resolved per review of vital signs  11. Metabolic Encephalopathy: Resolved 12. Tachycardia: Resolved.  13. Hyperkalemia:  resolved. Pt now hypokalemia.  Code Status: Full Code Family Communication:Friend at bedside. Disposition Plan: Not yet ready for discharge  Tiffnay Bossi A.  Pager [phone]. If 7PM-7AM, please contact night-coverage.  12/19/2016, 9:41 AM  LOS: 6 days   Interim History: Mr. Oswald is fully awake and back to his baseline of mentation. He is having pain in the left chest wall in the infraclavicular region. He denies any fever, chills cough, SOB, DOE or Pleuritic pain. He rates the pain as 9/10 in that region. He has refused the warm compress to the area of swelling stating that it doesn't work and is demanding a CXR. I've explained to the patient that a CXR would not offer any new information about the soft tissue swelling but would give me information about his lungs. He insists that he has no lung symptoms but he is insistent and actually became verbally threatening in his tone regarding the CXR. He refused to give details about any concerns that he was having so that I could further explore any clinical issues that may be developing.   Consultants:  None  Procedures:  None  Antibiotics:  None   Objective: Vitals:   12/19/16 0418 12/19/16 0631 12/19/16 0855 12/19/16 0901  BP:  111/71  110/64  Pulse:  89  67  Resp: 15 11 10 12   Temp:  98.6 F (37 C)  98.7 F (37.1 C)  TempSrc:  Oral  Oral  SpO2: 99% 98% 95% 94%  Weight:      Height:       Weight change:  Intake/Output Summary (Last 24 hours) at 12/19/2016 0941 Last data filed at 12/19/2016 0600 Gross per 24 hour  Intake 2101.5 ml  Output 500 ml  Net 1601.5 ml      Physical Exam  BP 110/64 (BP Location: Left Arm)   Pulse 67   Temp 98.7 F (37.1 C) (Oral)   Resp 12   Ht 5\' 3"  (1.6 m)   Wt 53.4 kg (117 lb 11.2 oz)   SpO2 94%   BMI 20.85 kg/m   General: Alert, awake, oriented x3, in mild distress due to pain in left upper chest wall. Marland Kitchen  HEENT: [place]/AT PEERL, EOMI, anicteric Neck: Trachea midline,  no  masses, no thyromegal,y no JVD, no carotid bruit OROPHARYNX:  Moist, No exudate/ erythema/lesions.  Heart: Regular rate and rhythm, II/VI non-radiating systolic ejection murmur at base. No rubs or gallops, PMI non-displaced, no heaves or thrills on palpation.  Lungs: Clear to auscultation but breath sounds decreased at b/l bases.  No wheezing or rhonchi noted. No increased vocal fremitus resonant to percussion  Abdomen: Soft, nontender, nondistended, positive bowel sounds, no masses no hepatosplenomegaly noted.  Neuro: No focal neurological deficits noted cranial nerves II through XII grossly intact. Strength at baseline in bilateral upper and lower extremities. Musculoskeletal: No warmth swelling or erythema around joints, no spinal tenderness noted. Area of tenderness and swelling noted in the left infraclavicular area but degree of swelling decreased since yesterday.  Psychiatric: Patient alert and oriented x3, very belligerent and demanding today.   Data Reviewed: Basic Metabolic Panel: Recent Labs  Lab 12/14/16 1224 12/15/16 0652 12/17/16 0511 12/18/16 0340 12/19/16 0409  NA 130* 132* 130* 129* 131*  K 5.5* 5.3* 4.3 3.8 3.1*  CL 105 107 105 100* 103  CO2 16* 15* 13* 18* 18*  GLUCOSE 103* 104* 92 107* 121*  BUN 23* 31* 46* 40* 33*  CREATININE 1.86* 2.13* 2.11* 1.62* 1.45*  CALCIUM 8.6* 9.1 8.9 8.5* 8.5*  MG  --   --   --   --  2.2  PHOS  --   --   --   --  2.9   Liver Function Tests: Recent Labs  Lab 12/13/16 1311 12/17/16 0511 12/18/16 0340 12/19/16 0409  AST 68* 86* 59*  --   ALT 51 37 30  --   ALKPHOS 186* 187* 187*  --   BILITOT 4.3* 4.6* 6.4*  --   PROT 7.4 7.2 7.3  --   ALBUMIN 4.1 3.2* 3.0* 2.9*   No results for input(s): LIPASE, AMYLASE in the last 168 hours. No results for input(s): AMMONIA in the last 168 hours. CBC: Recent Labs  Lab 12/13/16 1311 12/15/16 1019 12/17/16 0511 12/17/16 1201 12/18/16 0340  WBC 15.0* 16.6* 16.3* 14.0* 13.8*  NEUTROABS  9.5* 10.6* 11.6*  --  9.0*  HGB 9.8* 8.2* 6.0* 7.0* 7.3*  HCT 27.3* 21.5* 16.3* 18.8* 19.8*  MCV 111.9* 105.9* 102.5* 102.2* 97.1  PLT 129* 105* 131* 123* 182   Cardiac Enzymes: Recent Labs  Lab 12/13/16 1311  TROPONINI <0.03   BNP (last 3 results) No results for input(s): BNP in the last 8760 hours.  ProBNP (last 3 results) No results for input(s): PROBNP in the last 8760 hours.  CBG: No results for input(s): GLUCAP in the last 168 hours.  No results found for this or any previous visit (from the past 240 hour(s)).   Studies: Dg Chest 2 View  Result Date: 12/15/2016 CLINICAL DATA:  36 year old male with hypoxia  and lethargy patient's history of sickle cell disease. EXAM: CHEST  2 VIEW COMPARISON:  12/13/2016 and priors. FINDINGS: The heart size and mediastinal contours are within normal limits. Prominent, chronic interstitial prominence is again noted. No focal opacities, pleural effusion or pneumothorax. 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 The visualized skeletal structures are otherwise unremarkable. IMPRESSION: 1. No acute intrathoracic process. 2. Stable, chronic interstitial prominence. 3. Stable, chronic avascular necrosis of the right humeral head. Electronically Signed   By: Sande Brothers M.D.   On: 12/15/2016 12:02   Dg Chest 2 View  Result Date: 12/13/2016 CLINICAL DATA:  Sickle cell disease with cough and chest pain EXAM: CHEST  2 VIEW COMPARISON:  October 17, 2014 FINDINGS: Lungs are clear. Heart size and pulmonary vascularity are normal. No adenopathy. There is sclerosis in the proximal right humeral head with changes suggesting a degree of avascular necrosis. IMPRESSION: Findings felt to represent a degree of avascular necrosis in the right humeral head. No edema or consolidation. Stable cardiac silhouette. Electronically Signed   By: Bretta Bang III M.D.   On: 12/13/2016 13:57   Ir Fluoro Guide Cv Line Right  Result Date:  12/18/2016 INDICATION: Sickle cell disorder.  No IV access. EXAM: TUNNELED RIGHT JUGULAR PICC LINE PLACEMENT WITH ULTRASOUND AND FLUOROSCOPIC GUIDANCE MEDICATIONS: None ANESTHESIA/SEDATION: Versed 1 mg IV; Fentanyl 50 mcg IV; Moderate Sedation Time:  10 The patient was continuously monitored during the procedure by the interventional radiology nurse under my direct supervision. FLUOROSCOPY TIME:  Fluoroscopy Time:  minutes 42 seconds (2.9 mGy). COMPLICATIONS: None immediate. PROCEDURE: The patient was advised of the possible risks and complications and agreed to undergo the procedure. The patient was then brought to the angiographic suite for the procedure. The right neck was prepped with chlorhexidine, draped in the usual sterile fashion using maximum barrier technique (cap and mask, sterile gown, sterile gloves, large sterile sheet, hand hygiene and cutaneous antiseptic). Local anesthesia was attained by infiltration with 1% lidocaine. Ultrasound demonstrated patency of the right jugular vein, and this was documented with an image. Under real-time ultrasound guidance, this vein was accessed with a 21 gauge micropuncture needle and image documentation was performed. The needle was exchanged over a guidewire for a peel-away sheath through which a 20 cm 5 [place] single lumen power injectable PICC was advanced, and positioned with its tip at the lower SVC/right atrial junction. The cuff was positioned in the subcutaneous tract. Fluoroscopy during the procedure and fluoro spot radiograph confirms appropriate catheter position. The catheter was flushed, secured to the skin with Prolene sutures, and covered with a sterile dressing. IMPRESSION: Successful placement of a tunneled right jugular PICC with sonographic and fluoroscopic guidance. The catheter is ready for use. Electronically Signed   By: Jolaine Click M.D.   On: 12/18/2016 10:39   Ir US Guide Vasc Access Right  Result Date: 12/18/2016 INDICATION: Sickle cell  disorder.  No IV access. EXAM: TUNNELED RIGHT JUGULAR PICC LINE PLACEMENT WITH ULTRASOUND AND FLUOROSCOPIC GUIDANCE MEDICATIONS: None ANESTHESIA/SEDATION: Versed 1 mg IV; Fentanyl 50 mcg IV; Moderate Sedation Time:  10 The patient was continuously monitored during the procedure by the interventional radiology nurse under my direct supervision. FLUOROSCOPY TIME:  Fluoroscopy Time:  minutes 42 seconds (2.9 mGy). COMPLICATIONS: None immediate. PROCEDURE: The patient was advised of the possible risks and complications and agreed to undergo the procedure. The patient was then brought to the angiographic suite for the procedure. The right neck was prepped with chlorhexidine,  draped in the usual sterile fashion using maximum barrier technique (cap and mask, sterile gown, sterile gloves, large sterile sheet, hand hygiene and cutaneous antiseptic). Local anesthesia was attained by infiltration with 1% lidocaine. Ultrasound demonstrated patency of the right jugular vein, and this was documented with an image. Under real-time ultrasound guidance, this vein was accessed with a 21 gauge micropuncture needle and image documentation was performed. The needle was exchanged over a guidewire for a peel-away sheath through which a 20 cm 5 [place] single lumen power injectable PICC was advanced, and positioned with its tip at the lower SVC/right atrial junction. The cuff was positioned in the subcutaneous tract. Fluoroscopy during the procedure and fluoro spot radiograph confirms appropriate catheter position. The catheter was flushed, secured to the skin with Prolene sutures, and covered with a sterile dressing. IMPRESSION: Successful placement of a tunneled right jugular PICC with sonographic and fluoroscopic guidance. The catheter is ready for use. Electronically Signed   By: Jolaine Click M.D.   On: 12/18/2016 10:39    Scheduled Meds: . enoxaparin (LOVENOX) injection  40 mg Subcutaneous Q24H  . HYDROmorphone   Intravenous Q4H   . nortriptyline  25 mg Oral QHS  . oxyCODONE  10 mg Oral Q6H  . potassium chloride  40 mEq Oral Q2H  . senna-docusate  1 tablet Oral BID  . sodium bicarbonate  650 mg Oral TID   Continuous Infusions: . sodium chloride 100 mL/hr at 12/19/16 2952    Active Problems:   Anemia of chronic disease   Chronic, continuous use of opioids   Sickle cell nephropathy (HCC)   Metabolic acidosis   Sickle cell crisis (HCC)   Acute kidney injury superimposed on chronic kidney disease (HCC)   Hyponatremia   Vasoocclusive sickle cell crisis (HCC)   Hypoxia   Soft tissue swelling of chest wall    In excess of 35 minutes spent during this visit. Greater than 50% involved face to face contact with the patient for assessment, counseling and coordination of care.

## 2016-12-19 NOTE — Care Management Important Message (Signed)
Important Message  Patient Details  Name: [name] [name]. MRN: [id number] Date of Birth: Jun 29, 1980   Medicare Important Message Given:  Yes    Kerin Salen 12/19/2016, 11:08 AMImportant Message  Patient Details  Name: [name] [name]. MRN: [id number] Date of Birth: Jan 16, 1981   Medicare Important Message Given:  Yes    Kerin Salen 12/19/2016, 11:08 AM

## 2016-12-19 NOTE — Progress Notes (Signed)
Replaced dilaudid syringe at this time. The remaining amount in the old Dilaudid syringe was 35mL. This was wasted in the sink and witnessed by Dellie Catholic, RN and empty syringe placed in the sharps container.

## 2016-12-19 NOTE — Plan of Care (Signed)
C/o pain still 9/10 to chest, back and legs.  Using PCA appropriately.  C/o isolated pain over left chest area, no change from yesterday.  Dr. Zigmund Daniel aware and will re-address today.

## 2016-12-20 LAB — CREATININE, SERUM: CREATININE: 1.4 mg/dL — AB (ref 0.61–1.24)

## 2016-12-20 LAB — BASIC METABOLIC PANEL
Anion gap: 9 (ref 5–15)
BUN: 25 mg/dL — ABNORMAL HIGH (ref 6–20)
CO2: 18 mmol/L — ABNORMAL LOW (ref 22–32)
Calcium: 9 mg/dL (ref 8.9–10.3)
Chloride: 107 mmol/L (ref 101–111)
Creatinine, Ser: 1.48 mg/dL — ABNORMAL HIGH (ref 0.61–1.24)
GFR calc Af Amer: >60 mL/min (ref >60)
GFR calc non Af Amer: >60 mL/min (ref >60)
Glucose, Bld: 123 mg/dL — ABNORMAL HIGH (ref 65–99)
Potassium: 4 mmol/L (ref 3.5–5.1)
Sodium: 134 mmol/L — ABNORMAL LOW (ref 135–145)

## 2016-12-20 LAB — CBC WITH DIFFERENTIAL/PLATELET
Basophils Absolute: 0 10*3/uL (ref 0.0–0.1)
Basophils Relative: 0 %
Eosinophils Absolute: 0.1 10*3/uL (ref 0.0–0.7)
Eosinophils Relative: 1 %
HCT: 15.2 % — ABNORMAL LOW (ref 39.0–52.0)
Hemoglobin: 5.6 g/dL — CL (ref 13.0–17.0)
Lymphocytes Relative: 29 %
Lymphs Abs: 3.3 10*3/uL (ref 0.7–4.0)
MCH: 36.8 pg — ABNORMAL HIGH (ref 26.0–34.0)
MCHC: 36.8 g/dL — ABNORMAL HIGH (ref 30.0–36.0)
MCV: 100 fL (ref 78.0–100.0)
Monocytes Absolute: 1.1 10*3/uL — ABNORMAL HIGH (ref 0.1–1.0)
Monocytes Relative: 10 %
Neutro Abs: 6.8 10*3/uL (ref 1.7–7.7)
Neutrophils Relative %: 60 %
Platelets: 265 10*3/uL (ref 150–400)
RBC: 1.52 MIL/uL — ABNORMAL LOW (ref 4.22–5.81)
RDW: 24.5 % — ABNORMAL HIGH (ref 11.5–15.5)
WBC: 11.3 10*3/uL — ABNORMAL HIGH (ref 4.0–10.5)
nRBC: 6 /100 WBC — ABNORMAL HIGH

## 2016-12-20 LAB — RETICULOCYTES
RBC.: 1.52 MIL/uL — ABNORMAL LOW (ref 4.22–5.81)
Retic Count, Absolute: 194.6 10*3/uL — ABNORMAL HIGH (ref 19.0–186.0)
Retic Ct Pct: 12.8 % — ABNORMAL HIGH (ref 0.4–3.1)

## 2016-12-20 LAB — FOLATE: Folate: 7.7 ng/mL (ref >5.9)

## 2016-12-20 LAB — VITAMIN B12: Vitamin B-12: 180 pg/mL (ref 180–914)

## 2016-12-20 LAB — PREPARE RBC (CROSSMATCH)

## 2016-12-20 MED ORDER — SODIUM CHLORIDE 0.9 % IV SOLN
Freq: Once | INTRAVENOUS | Status: AC
  Administered 2016-12-20: 14:00:00 via INTRAVENOUS

## 2016-12-20 MED ORDER — FOLIC ACID 1 MG PO TABS
2.0000 mg | ORAL_TABLET | Freq: Every day | ORAL | Status: DC
  Administered 2016-12-20 – 2016-12-21 (×2): 2 mg via ORAL
  Filled 2016-12-20 (×3): qty 2

## 2016-12-20 MED ORDER — SALINE SPRAY 0.65 % NA SOLN
1.0000 | NASAL | Status: DC | PRN
  Filled 2016-12-20: qty 44

## 2016-12-20 NOTE — Progress Notes (Signed)
SICKLE CELL SERVICE PROGRESS NOTE  [name] [name]. WUJ:[id number] DOB: 10-23-1980 DOA: 12/13/2016 PCP: [name] Cashing, MD  Assessment/Plan: Active Problems:   Anemia of chronic disease   Chronic, continuous use of opioids   Sickle cell nephropathy (HCC)   Metabolic acidosis   Sickle cell crisis (HCC)   Acute kidney injury superimposed on chronic kidney disease (HCC)   Hyponatremia   Vasoocclusive sickle cell crisis (HCC)   Hypoxia   Soft tissue swelling of chest wall  1. Hyponatremia: Improved. Continue to hold IVF.  2. Hypokalemia: Improved with oral replacement.  Will replace orally.  3. Soft Tissue Swelling: The swelling has decreased since yesterday. It appears to be related to muscular activity. The CXR of course was non-yielding with regard to the soft tissue.  4. Abnormal CXR findings: Findings noted but patient has no clinical constellation of symptoms consistent with infection. My assessment is that thisis atelectasis. Pt has been encouraged to use ICS by both the nurse and myself but is refusing.  5. Dehydration: Resolved.   6. Metabolic Acidosis: Bicarbonate has remained stable at 18 for the last 3 days. I spoke with [name] [name] office and his last bicarbonate in the office was 23 when he was taking bicarbonate 650 mg BID. Continue oral bicarbonate. Will speak with Nephrology and schedule a follow-up appointment post-discharge.  7. AKI in setting of CKD II: Resolved. Per [name] [name] office his baseline Cr is 1.56 and patient's Cr is at 1.48.  8. Hb SS with crisis: Continue PCA bolus of 0.5 mg, Continue Oxycodone 10 mg scheduled every 4 hours. No NSAID's due to CKD.  9. Anemia of Chronic Disease: Hb dropped after resuming Hydrea. Pt now admits that he has not been taking Hydrea on a regular basis. He reports that he only takes Hydrea about 2 x week.  10. Macrocytic Anemia: If he were taking Hydrea on a regular basis this would explain the macrocytic anemia. However in  the absence of taking the Hydrea on a regular basis, I will check B-12 and Folate as well as his TSH. Pt and mother denies any significant ETOH use by patient.  11. Poor PIV Access: Pt  had PICC line placed by IR on Sunday. 12. Hypoxia:Resolved . Saturations 100% on RA. However he has areas of atelectasis and refuses to use the ICS. 13. Metabolic Encephalopathy: Resolved 14. Tachycardia: HR increased with ambulation. Expect to see improvement after transfusion. .  15. Hyperkalemia: resolved. Pt now hypokalemia.  Code Status: Full Code Family Communication: Spoke with his mother [name] [name] at his request and updated her on his condition and anticipated discharge tomorrow.  Disposition Plan: Anticipate discharge home tomorrow.   [name] [name] A.  Pager [phone]. If 7PM-7AM, please contact night-coverage.  12/20/2016, 3:52 PM  LOS: 7 days   Interim History: [name] [name] reports that he is still having pain in the left infra-clavicular area and that it is worse with movement. He has refused to use the ICS despite advising of the importance of ICS in preventing Acute Chest Syndrome. He rates his pain in the chest wall 8/10 but refuses to use the warm compress ane he rates the pain in his low back as 5/10. I have again offered warm packs and patient has muscle rub ordered and available which he has not used. He denies any cough, SOB, DOE or lightheadedness. He reports that he has been taking Hydrea only about 2 x week.   Consultants:  None  Procedures:  None  Antibiotics:  None   Objective: Vitals:   12/20/16 1053 12/20/16 1152 12/20/16 1415 12/20/16 1450  BP: 122/80  140/78 120/68  Pulse: (!) 108  (!) 117 99  Resp: 18 16 18 16   Temp:   98 F (36.7 C) 98.1 F (36.7 C)  TempSrc: Axillary  Oral Oral  SpO2:  95% 100% 99%  Weight:      Height:       Weight change:   Intake/Output Summary (Last 24 hours) at 12/20/2016 1552 Last data filed at 12/20/2016 1416 Gross per 24  hour  Intake 1797.67 ml  Output 3125 ml  Net -1327.33 ml      Physical Exam  BP 120/68   Pulse 99   Temp 98.1 F (36.7 C) (Oral)   Resp 16   Ht 5\' 3"  (1.6 m)   Wt 53.4 kg (117 lb 11.2 oz)   SpO2 99%   BMI 20.85 kg/m   General: Alert, awake, oriented x3, in mild distress due to pain in left upper chest wall. Marland Kitchen  HEENT: [place]/AT PEERL, EOMI, anicteric Neck: Trachea midline,  no masses, no thyromegal,y no JVD, no carotid bruit OROPHARYNX:  Moist, No exudate/ erythema/lesions.  Heart: Regular rate and rhythm, II/VI non-radiating systolic ejection murmur at base. No rubs or gallops, PMI non-displaced, no heaves or thrills on palpation.  Lungs: Clear to auscultation but breath sounds decreased at b/l bases.  No wheezing or rhonchi noted. No increased vocal fremitus resonant to percussion  Abdomen: Soft, nontender, nondistended, positive bowel sounds, no masses no hepatosplenomegaly noted.  Neuro: No focal neurological deficits noted cranial nerves II through XII grossly intact. Strength at baseline in bilateral upper and lower extremities. Musculoskeletal: No warmth swelling or erythema around joints, no spinal tenderness noted. Area of tenderness and swelling noted in the left infraclavicular area but degree of swelling decreased since yesterday.  Psychiatric: Patient alert and oriented x3, very uncooperative with staff today.   Data Reviewed: Basic Metabolic Panel: Recent Labs  Lab 12/15/16 0652 12/17/16 0511 12/18/16 0340 12/19/16 0409 12/20/16 0407 12/20/16 1041  NA 132* 130* 129* 131*  --  134*  K 5.3* 4.3 3.8 3.1*  --  4.0  CL 107 105 100* 103  --  107  CO2 15* 13* 18* 18*  --  18*  GLUCOSE 104* 92 107* 121*  --  123*  BUN 31* 46* 40* 33*  --  25*  CREATININE 2.13* 2.11* 1.62* 1.45* 1.40* 1.48*  CALCIUM 9.1 8.9 8.5* 8.5*  --  9.0  MG  --   --   --  2.2  --   --   PHOS  --   --   --  2.9  --   --    Liver Function Tests: Recent Labs  Lab 12/17/16 0511 12/18/16 0340  12/19/16 0409  AST 86* 59*  --   ALT 37 30  --   ALKPHOS 187* 187*  --   BILITOT 4.6* 6.4*  --   PROT 7.2 7.3  --   ALBUMIN 3.2* 3.0* 2.9*   No results for input(s): LIPASE, AMYLASE in the last 168 hours. No results for input(s): AMMONIA in the last 168 hours. CBC: Recent Labs  Lab 12/15/16 1019 12/17/16 0511 12/17/16 1201 12/18/16 0340 12/20/16 1041  WBC 16.6* 16.3* 14.0* 13.8* 11.3*  NEUTROABS 10.6* 11.6*  --  9.0* 6.8  HGB 8.2* 6.0* 7.0* 7.3* 5.6*  HCT 21.5* 16.3* 18.8* 19.8* 15.2*  MCV 105.9* 102.5* 102.2* 97.1  100.0  PLT 105* 131* 123* 182 265   Cardiac Enzymes: No results for input(s): CKTOTAL, CKMB, CKMBINDEX, TROPONINI in the last 168 hours. BNP (last 3 results) No results for input(s): BNP in the last 8760 hours.  ProBNP (last 3 results) No results for input(s): PROBNP in the last 8760 hours.  CBG: No results for input(s): GLUCAP in the last 168 hours.  No results found for this or any previous visit (from the past 240 hour(s)).   Studies: Dg Chest 1 View  Result Date: 12/19/2016 CLINICAL DATA:  Soft tissue swelling chest wall. Sickle cell disease EXAM: CHEST 1 VIEW COMPARISON:  12/15/2016 FINDINGS: Heart size within normal limits. Vascularity normal. Central venous catheter tip in the SVC/RA junction. No pneumothorax. No pleural effusion or mass. Patchy airspace disease in the left lung base is new since the prior study. Changes in the right humeral head, possible AVN IMPRESSION: Left lower lobe airspace disease has developed in the interval. This may represent atelectasis or pneumonia Central venous catheter tip at the cavoatrial junction. Electronically Signed   By: Marlan Palau M.D.   On: 12/19/2016 12:40   Dg Chest 2 View  Result Date: 12/15/2016 CLINICAL DATA:  36 year old male with hypoxia and lethargy patient's history of sickle cell disease. EXAM: CHEST  2 VIEW COMPARISON:  12/13/2016 and priors. FINDINGS: The heart size and mediastinal contours are  within normal limits. Prominent, chronic interstitial prominence is again noted. No focal opacities, pleural effusion or pneumothorax. 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 The visualized skeletal structures are otherwise unremarkable. IMPRESSION: 1. No acute intrathoracic process. 2. Stable, chronic interstitial prominence. 3. Stable, chronic avascular necrosis of the right humeral head. Electronically Signed   By: Sande Brothers M.D.   On: 12/15/2016 12:02   Dg Chest 2 View  Result Date: 12/13/2016 CLINICAL DATA:  Sickle cell disease with cough and chest pain EXAM: CHEST  2 VIEW COMPARISON:  October 17, 2014 FINDINGS: Lungs are clear. Heart size and pulmonary vascularity are normal. No adenopathy. There is sclerosis in the proximal right humeral head with changes suggesting a degree of avascular necrosis. IMPRESSION: Findings felt to represent a degree of avascular necrosis in the right humeral head. No edema or consolidation. Stable cardiac silhouette. Electronically Signed   By: Bretta Bang III M.D.   On: 12/13/2016 13:57   Ir Fluoro Guide Cv Line Right  Result Date: 12/18/2016 INDICATION: Sickle cell disorder.  No IV access. EXAM: TUNNELED RIGHT JUGULAR PICC LINE PLACEMENT WITH ULTRASOUND AND FLUOROSCOPIC GUIDANCE MEDICATIONS: None ANESTHESIA/SEDATION: Versed 1 mg IV; Fentanyl 50 mcg IV; Moderate Sedation Time:  10 The patient was continuously monitored during the procedure by the interventional radiology nurse under my direct supervision. FLUOROSCOPY TIME:  Fluoroscopy Time:  minutes 42 seconds (2.9 mGy). COMPLICATIONS: None immediate. PROCEDURE: The patient was advised of the possible risks and complications and agreed to undergo the procedure. The patient was then brought to the angiographic suite for the procedure. The right neck was prepped with chlorhexidine, draped in the usual sterile fashion using maximum barrier technique (cap and mask, sterile gown, sterile  gloves, large sterile sheet, hand hygiene and cutaneous antiseptic). Local anesthesia was attained by infiltration with 1% lidocaine. Ultrasound demonstrated patency of the right jugular vein, and this was documented with an image. Under real-time ultrasound guidance, this vein was accessed with a 21 gauge micropuncture needle and image documentation was performed. The needle was exchanged over a guidewire for a peel-away sheath  through which a 20 cm 5 [place] single lumen power injectable PICC was advanced, and positioned with its tip at the lower SVC/right atrial junction. The cuff was positioned in the subcutaneous tract. Fluoroscopy during the procedure and fluoro spot radiograph confirms appropriate catheter position. The catheter was flushed, secured to the skin with Prolene sutures, and covered with a sterile dressing. IMPRESSION: Successful placement of a tunneled right jugular PICC with sonographic and fluoroscopic guidance. The catheter is ready for use. Electronically Signed   By: Jolaine Click M.D.   On: 12/18/2016 10:39   Ir US Guide Vasc Access Right  Result Date: 12/18/2016 INDICATION: Sickle cell disorder.  No IV access. EXAM: TUNNELED RIGHT JUGULAR PICC LINE PLACEMENT WITH ULTRASOUND AND FLUOROSCOPIC GUIDANCE MEDICATIONS: None ANESTHESIA/SEDATION: Versed 1 mg IV; Fentanyl 50 mcg IV; Moderate Sedation Time:  10 The patient was continuously monitored during the procedure by the interventional radiology nurse under my direct supervision. FLUOROSCOPY TIME:  Fluoroscopy Time:  minutes 42 seconds (2.9 mGy). COMPLICATIONS: None immediate. PROCEDURE: The patient was advised of the possible risks and complications and agreed to undergo the procedure. The patient was then brought to the angiographic suite for the procedure. The right neck was prepped with chlorhexidine, draped in the usual sterile fashion using maximum barrier technique (cap and mask, sterile gown, sterile gloves, large sterile sheet, hand  hygiene and cutaneous antiseptic). Local anesthesia was attained by infiltration with 1% lidocaine. Ultrasound demonstrated patency of the right jugular vein, and this was documented with an image. Under real-time ultrasound guidance, this vein was accessed with a 21 gauge micropuncture needle and image documentation was performed. The needle was exchanged over a guidewire for a peel-away sheath through which a 20 cm 5 [place] single lumen power injectable PICC was advanced, and positioned with its tip at the lower SVC/right atrial junction. The cuff was positioned in the subcutaneous tract. Fluoroscopy during the procedure and fluoro spot radiograph confirms appropriate catheter position. The catheter was flushed, secured to the skin with Prolene sutures, and covered with a sterile dressing. IMPRESSION: Successful placement of a tunneled right jugular PICC with sonographic and fluoroscopic guidance. The catheter is ready for use. Electronically Signed   By: Jolaine Click M.D.   On: 12/18/2016 10:39    Scheduled Meds: . enoxaparin (LOVENOX) injection  40 mg Subcutaneous Q24H  . folic acid  2 mg Oral Daily  . HYDROmorphone   Intravenous Q4H  . nortriptyline  25 mg Oral QHS  . oxyCODONE  10 mg Oral Q4H  . senna-docusate  1 tablet Oral BID  . sodium bicarbonate  650 mg Oral TID   Continuous Infusions: . sodium chloride 20 mL/hr at 12/19/16 1832    Active Problems:   Anemia of chronic disease   Chronic, continuous use of opioids   Sickle cell nephropathy (HCC)   Metabolic acidosis   Sickle cell crisis (HCC)   Acute kidney injury superimposed on chronic kidney disease (HCC)   Hyponatremia   Vasoocclusive sickle cell crisis (HCC)   Hypoxia   Soft tissue swelling of chest wall    In excess of 35 minutes spent during this visit. Greater than 50% involved face to face contact with the patient for assessment, counseling and coordination of care.

## 2016-12-20 NOTE — Progress Notes (Signed)
PHARMACY BRIEF NOTE: HYDROXYUREA   By [facility] policy, hydroxyurea is automatically held when any of the following laboratory values occur:  ANC < 2 K  Pltc < 80K in sickle-cell patients ; < 100K in other patients  Hgb <= 6 in sickle-cell patients (5.6); < 8 in other patients  Reticulocytes < 80K when Hgb < 9  Hydroxyurea has been held (discontinued from profile) per policy.   Dia Sitter, PharmD, BCPS 12/20/2016 12:40 PM

## 2016-12-20 NOTE — Progress Notes (Addendum)
CRITICAL VALUE ALERT  Critical Value: Hgb 5.6  Date & Time Notied: 12/20/16  Provider Notified: Zigmund Daniel  Orders Received/Actions taken: transfusion ordered

## 2016-12-20 NOTE — Progress Notes (Signed)
Resting HR 109, Oxygen sat 96 on RA before ambulation.  During ambulation HR was 85-90 and sats 92-94%.  During the last 100 ft oxygen sat dipped to 88% and pt stated he was little more SOB, HR was 90.  Watt Climes RN

## 2016-12-20 NOTE — Progress Notes (Signed)
Pt refusing to wear oxygen sat probe and CO2 detection equipment from PCA.  Educated on importance and policy.

## 2016-12-21 ENCOUNTER — Encounter (HOSPITAL_COMMUNITY): Payer: Self-pay | Admitting: Radiology

## 2016-12-21 ENCOUNTER — Other Ambulatory Visit (HOSPITAL_COMMUNITY): Payer: Self-pay | Admitting: Radiology

## 2016-12-21 ENCOUNTER — Ambulatory Visit (HOSPITAL_COMMUNITY)
Admission: RE | Admit: 2016-12-21 | Discharge: 2016-12-21 | Disposition: A | Discharge status: Home / Self Care | Payer: Medicare Other | Source: Ambulatory Visit | Attending: Radiology | Admitting: Radiology

## 2016-12-21 DIAGNOSIS — Z452 Encounter for adjustment and management of vascular access device: Secondary | ICD-10-CM | POA: Diagnosis not present

## 2016-12-21 DIAGNOSIS — N08 Glomerular disorders in diseases classified elsewhere: Secondary | ICD-10-CM

## 2016-12-21 DIAGNOSIS — N189 Chronic kidney disease, unspecified: Secondary | ICD-10-CM

## 2016-12-21 DIAGNOSIS — D5700 Hb-SS disease with crisis, unspecified: Secondary | ICD-10-CM

## 2016-12-21 DIAGNOSIS — Z862 Personal history of diseases of the blood and blood-forming organs and certain disorders involving the immune mechanism: Secondary | ICD-10-CM | POA: Diagnosis not present

## 2016-12-21 HISTORY — PX: IR REMOVAL TUN CV CATH W/O FL: IMG2289

## 2016-12-21 LAB — BPAM RBC
BLOOD PRODUCT EXPIRATION DATE: 201812082359
Blood Product Expiration Date: 201812142359
Blood Product Expiration Date: 201812142359
ISSUE DATE / TIME: 201811071422
ISSUE DATE / TIME: 201811041406
UNIT TYPE AND RH: 5100
UNIT TYPE AND RH: 5100
Unit Type and Rh: 5100

## 2016-12-21 LAB — CBC WITH DIFFERENTIAL/PLATELET
BASOS ABS: 0 10*3/uL (ref 0.0–0.1)
Basophils Relative: 0 %
EOS ABS: 0.2 10*3/uL (ref 0.0–0.7)
EOS PCT: 2 %
HEMATOCRIT: 21.1 % — AB (ref 39.0–52.0)
HEMOGLOBIN: 7.5 g/dL — AB (ref 13.0–17.0)
LYMPHS ABS: 4.2 10*3/uL — AB (ref 0.7–4.0)
Lymphocytes Relative: 38 %
MCH: 34.4 pg — ABNORMAL HIGH (ref 26.0–34.0)
MCHC: 35.5 g/dL (ref 30.0–36.0)
MCV: 96.8 fL (ref 78.0–100.0)
MONOS PCT: 4 %
Monocytes Absolute: 0.4 10*3/uL (ref 0.1–1.0)
NEUTROS PCT: 56 %
Neutro Abs: 6.3 10*3/uL (ref 1.7–7.7)
Platelets: 314 10*3/uL (ref 150–400)
RBC: 2.18 MIL/uL — AB (ref 4.22–5.81)
RDW: 25.3 % — AB (ref 11.5–15.5)
WBC: 11.1 10*3/uL — AB (ref 4.0–10.5)
nRBC: 5 /100 WBC — ABNORMAL HIGH

## 2016-12-21 LAB — TYPE AND SCREEN
ABO/RH(D): O POS
ANTIBODY SCREEN: NEGATIVE
DONOR AG TYPE: NEGATIVE
DONOR AG TYPE: NEGATIVE
Donor AG Type: NEGATIVE
Unit division: 0
Unit division: 0
Unit division: 0

## 2016-12-21 LAB — RETICULOCYTES
RBC.: 2.18 MIL/uL — AB (ref 4.22–5.81)
RETIC COUNT ABSOLUTE: 285.6 10*3/uL — AB (ref 19.0–186.0)
RETIC CT PCT: 13.1 % — AB (ref 0.4–3.1)

## 2016-12-21 LAB — BASIC METABOLIC PANEL
ANION GAP: 7 (ref 5–15)
BUN: 21 mg/dL — ABNORMAL HIGH (ref 6–20)
CALCIUM: 8.8 mg/dL — AB (ref 8.9–10.3)
CO2: 20 mmol/L — AB (ref 22–32)
Chloride: 105 mmol/L (ref 101–111)
Creatinine, Ser: 1.48 mg/dL — ABNORMAL HIGH (ref 0.61–1.24)
GFR calc non Af Amer: >60 mL/min (ref >60)
Glucose, Bld: 135 mg/dL — ABNORMAL HIGH (ref 65–99)
Potassium: 3.8 mmol/L (ref 3.5–5.1)
SODIUM: 132 mmol/L — AB (ref 135–145)

## 2016-12-21 MED ORDER — SODIUM BICARBONATE 650 MG PO TABS: 650.0000 mg | ORAL_TABLET | Freq: Three times a day (TID) | ORAL | 1 refills | Status: DC

## 2016-12-21 MED ORDER — MUSCLE RUB 10-15 % EX CREA: 1.0000 "application " | TOPICAL_CREAM | CUTANEOUS | 0 refills | Status: DC | PRN

## 2016-12-21 MED ORDER — HYDROXYUREA 500 MG PO CAPS: ORAL_CAPSULE | ORAL | Status: DC

## 2016-12-21 MED ORDER — FOLIC ACID 1 MG PO TABS: 1.0000 mg | ORAL_TABLET | Freq: Every day | ORAL | 2 refills | Status: DC

## 2016-12-21 NOTE — Discharge Summary (Signed)
[name] A Coppens Jr. MRN: [id number] DOB/AGE: 34/16/1982 34 y.o.  Admit date: 12/13/2016 Discharge date: 12/21/2016  Primary Care Physician:  Billee Cashing, MD   Discharge Diagnoses:   Patient Active Problem List   Diagnosis Date Noted  . Soft tissue swelling of chest wall 12/18/2016  . Hypoxia   . Acute kidney injury superimposed on chronic kidney disease (HCC) 12/13/2016  . Vasoocclusive sickle cell crisis (HCC) 12/13/2016  . Sickle cell crisis (HCC) 10/14/2016  . Hyponatremia 10/14/2016  . Tachycardia with heart rate 121-140 beats per minute with ambulation 08/04/2016  . Metabolic acidosis 08/04/2016  . Anemia of chronic disease 08/02/2016  . Macrocytosis due to Hydroxyurea 08/02/2016  . Chronic, continuous use of opioids 08/30/2012  . Chronic headaches 07/10/2012  . Gynecomastia, male 07/10/2012  . Sickle cell nephropathy (HCC) 07/10/2012  . Systolic murmur 12/08/2011  . Migraine 11/26/2009  . CHRONIC KIDNEY DISEASE STAGE II (MILD) 03/06/2009  . Sickle cell disease, type SS.  06/18/2008  . TOBACCO ABUSE 05/22/2007    DISCHARGE MEDICATION: Allergies as of 12/21/2016      Reactions   Nsaids    Morphine And Related Other (See Comments)   "real bad headaches"      Medication List    TAKE these medications   acetaminophen 325 MG tablet Commonly known as:  TYLENOL Take 650 mg by mouth 2 (two) times daily as needed for moderate pain.   folic acid 1 MG tablet Commonly known as:  FOLVITE Take 1 tablet (1 mg total) daily by mouth. Start taking on:  12/22/2016   hydroxyurea 500 MG capsule Commonly known as:  HYDREA TAKE 3 CAPSULES (1,500 MG TOTAL) BY MOUTH DAILY. Hold taking this medication until discussed with Dr. Ronne Binning as patient is not taking regularly and the dose which he is prescribed when taken regularly has demonstrated bone marrow supression What changed:  See the new instructions.   MUSCLE RUB 10-15 % Crea Apply 1 application as needed topically for  muscle pain.   nortriptyline 25 MG capsule Commonly known as:  PAMELOR TAKE 1 CAPSULE (25 MG TOTAL) BY MOUTH AT BEDTIME.   Oxycodone HCl 10 MG Tabs Take 10 mg by mouth every 6 (six) hours as needed.   sodium bicarbonate 650 MG tablet Take 1 tablet (650 mg total) 3 (three) times daily by mouth. What changed:  when to take this         Consults:    SIGNIFICANT DIAGNOSTIC STUDIES:  Dg Chest 1 View  Result Date: 12/19/2016 CLINICAL DATA:  Soft tissue swelling chest wall. Sickle cell disease EXAM: CHEST 1 VIEW COMPARISON:  12/15/2016 FINDINGS: Heart size within normal limits. Vascularity normal. Central venous catheter tip in the SVC/RA junction. No pneumothorax. No pleural effusion or mass. Patchy airspace disease in the left lung base is new since the prior study. Changes in the right humeral head, possible AVN IMPRESSION: Left lower lobe airspace disease has developed in the interval. This may represent atelectasis or pneumonia Central venous catheter tip at the cavoatrial junction. Electronically Signed   By: Marlan Palau M.D.   On: 12/19/2016 12:40   Dg Chest 2 View  Result Date: 12/15/2016 CLINICAL DATA:  36 year old male with hypoxia and lethargy patient's history of sickle cell disease. EXAM: CHEST  2 VIEW COMPARISON:  12/13/2016 and priors. FINDINGS: The heart size and mediastinal contours are within normal limits. Prominent, chronic interstitial prominence is again noted. No focal opacities, pleural effusion or pneumothorax. Chronic avascular necrosis of the right  humeral head again noted. The visualized skeletal structures are otherwise unremarkable. IMPRESSION: 1. No acute intrathoracic process. 2. Stable, chronic interstitial prominence. 3. Stable, chronic avascular necrosis of the right humeral head. Electronically Signed   By: Sande Brothers M.D.   On: 12/15/2016 12:02   Dg Chest 2 View  Result Date: 12/13/2016 CLINICAL DATA:  Sickle cell disease with cough and chest pain  EXAM: CHEST  2 VIEW COMPARISON:  October 17, 2014 FINDINGS: Lungs are clear. Heart size and pulmonary vascularity are normal. No adenopathy. There is sclerosis in the proximal right humeral head with changes suggesting a degree of avascular necrosis. IMPRESSION: Findings felt to represent a degree of avascular necrosis in the right humeral head. No edema or consolidation. Stable cardiac silhouette. Electronically Signed   By: Bretta Bang III M.D.   On: 12/13/2016 13:57   Ir Fluoro Guide Cv Line Right  Result Date: 12/18/2016 INDICATION: Sickle cell disorder.  No IV access. EXAM: TUNNELED RIGHT JUGULAR PICC LINE PLACEMENT WITH ULTRASOUND AND FLUOROSCOPIC GUIDANCE MEDICATIONS: None ANESTHESIA/SEDATION: Versed 1 mg IV; Fentanyl 50 mcg IV; Moderate Sedation Time:  10 The patient was continuously monitored during the procedure by the interventional radiology nurse under my direct supervision. FLUOROSCOPY TIME:  Fluoroscopy Time:  minutes 42 seconds (2.9 mGy). COMPLICATIONS: None immediate. PROCEDURE: The patient was advised of the possible risks and complications and agreed to undergo the procedure. The patient was then brought to the angiographic suite for the procedure. The right neck was prepped with chlorhexidine, draped in the usual sterile fashion using maximum barrier technique (cap and mask, sterile gown, sterile gloves, large sterile sheet, hand hygiene and cutaneous antiseptic). Local anesthesia was attained by infiltration with 1% lidocaine. Ultrasound demonstrated patency of the right jugular vein, and this was documented with an image. Under real-time ultrasound guidance, this vein was accessed with a 21 gauge micropuncture needle and image documentation was performed. The needle was exchanged over a guidewire for a peel-away sheath through which a 20 cm 5 [place] single lumen power injectable PICC was advanced, and positioned with its tip at the lower SVC/right atrial junction. The cuff was  positioned in the subcutaneous tract. Fluoroscopy during the procedure and fluoro spot radiograph confirms appropriate catheter position. The catheter was flushed, secured to the skin with Prolene sutures, and covered with a sterile dressing. IMPRESSION: Successful placement of a tunneled right jugular PICC with sonographic and fluoroscopic guidance. The catheter is ready for use. Electronically Signed   By: Jolaine Click M.D.   On: 12/18/2016 10:39   Ir US Guide Vasc Access Right  Result Date: 12/18/2016 INDICATION: Sickle cell disorder.  No IV access. EXAM: TUNNELED RIGHT JUGULAR PICC LINE PLACEMENT WITH ULTRASOUND AND FLUOROSCOPIC GUIDANCE MEDICATIONS: None ANESTHESIA/SEDATION: Versed 1 mg IV; Fentanyl 50 mcg IV; Moderate Sedation Time:  10 The patient was continuously monitored during the procedure by the interventional radiology nurse under my direct supervision. FLUOROSCOPY TIME:  Fluoroscopy Time:  minutes 42 seconds (2.9 mGy). COMPLICATIONS: None immediate. PROCEDURE: The patient was advised of the possible risks and complications and agreed to undergo the procedure. The patient was then brought to the angiographic suite for the procedure. The right neck was prepped with chlorhexidine, draped in the usual sterile fashion using maximum barrier technique (cap and mask, sterile gown, sterile gloves, large sterile sheet, hand hygiene and cutaneous antiseptic). Local anesthesia was attained by infiltration with 1% lidocaine. Ultrasound demonstrated patency of the right jugular vein, and this was documented with an image.  Under real-time ultrasound guidance, this vein was accessed with a 21 gauge micropuncture needle and image documentation was performed. The needle was exchanged over a guidewire for a peel-away sheath through which a 20 cm 5 [place] single lumen power injectable PICC was advanced, and positioned with its tip at the lower SVC/right atrial junction. The cuff was positioned in the subcutaneous  tract. Fluoroscopy during the procedure and fluoro spot radiograph confirms appropriate catheter position. The catheter was flushed, secured to the skin with Prolene sutures, and covered with a sterile dressing. IMPRESSION: Successful placement of a tunneled right jugular PICC with sonographic and fluoroscopic guidance. The catheter is ready for use. Electronically Signed   By: Jolaine Click M.D.   On: 12/18/2016 10:39     No results found for this or any previous visit (from the past 240 hour(s)).  BRIEF ADMITTING H & P: Mr. Spomer is an opiate tolerant patient with Hb SS who presents to the ED with pain characteristic of his sickle cell crisis. He is currently arousable and able to speak coherently on th ephone with family members but is uncooperative with providing information regarding the present illness. He only states that he is hurting all over and does not want to be bothered with any questions. He states that he ran out of his medicine but offers no other information.   I the ED he received 3 doses of IV Dilaudid over a 2 hour period as there was some concern ability to tolerate higher and more frequent doses of medication. Per the ED provider he was not forthcoming with much information and thus he was assumed opiate naive. NSAID's are contraindicated for this patient due to CKD     Hospital Course:  Present on Admission: . Vasoocclusive sickle cell crisis (HCC) . Hyponatremia . Acute kidney injury superimposed on chronic kidney disease (HCC) . Sickle cell crisis (HCC) . Metabolic acidosis . Chronic, continuous use of opioids . Anemia of chronic disease . (Resolved) Hyperkalemia . Sickle cell nephropathy (HCC)  1. Hb SS with crisis: Mr. Sobel was admitted with a sickle cell crisis and started on a Dilaudid PCA and IV fluids.  Toradol was not initiated due to his chronic kidney disease and knowledge of the patient having worsening kidney disease with the use of NSAIDs.   Initially the patient's pain was improving however the patient lost IV access and refused to have a central line placed and his management of pain by IV route was interrupted.  He received subcutaneous doses of IV Dilaudid along with his oral analgesics until IV access could be reestablished.  Once this happened IV pain management was resumed and as the pain improved he was transitioned to oral analgesics.  At the time of discharge the patient reported his pain as "fine and manageable at home".  2. AK I in setting of CKD II: The patient's creatinine peaked at 2.13.  He was given aggressive IV fluids which eventually resulted in resolution of the acute kidney injury.  However due to the patient's refusal of placement of IV access his management was delayed.  The patient did appear to have some metabolic encephalopathy during the time that he was without IV access and IV fluids.  However once IV was reestablished and he received fluids metabolic encephalopathy as well as the acute kidney injury resolved.  I spoke with his nephrologist office reported that his baseline creatinine was 1.56 and baseline bicarb 18.  The patient had previously been on lisinopril for  renal protection however at some point this was discontinued and the patient is unclear as to when this was discontinued.  However according to the notation from his nephrologist office the patient is supposed to be on lisinopril currently.  I will defer to the nephrologist on the appointment on 01/30/2017 to further resolve this. 3. Metabolic acidosis with metabolic encephalopathy: Patient has chronic metabolic acidosis and on a chronic basis has been prescribed sodium bicarbonate.  The patient is not adherent to the use of sodium bicarbonate and with the acute vaso-occlusive crisis and dehydration he became more acidotic with his serum bicarbonate dropping as low as 13.  He was started on oral bicarbonate and a bicarbonate drip was ordered however it was  never instituted as the patient lost IV access.  During the time of metabolic acidosis the patient was somewhat encephalopathic and there was some concern as to whether or not he could be having seizures.  He was transferred from a MedSurg bed to telemetry monitoring.  However no seizure activity was noted and he had no aberrancies in cardiac activity except for tachycardia.  Once hydration was reestablished and all of the metabolic acidosis resolved so did the metabolic encephalopathy.  The patient has been counseled to continue to take sodium bicarbonate on a chronic basis.  However please note that this patient does not have readiness to receive new information and is actually quite resistant to any advice that does not concur with his evaluation of his condition. 4. Electrolyte disturbances: The patient had multiple electrolytes abnormalities including hyponatremia, initial hyperkalemia and then hypokalemia.  All of this was resolving with supplementation of both sodium and potassium through IV and oral roots.  At the time of discharge he had only a mild hypo-natremia with a sodium of 132 and potassium levels were back to normal. 5. Macrocytic anemia: Patient had initially reported that he was adherent to hydroxyurea which would lead to a macrocytic anemia.  However the patient subsequently admitted that he takes his hydroxyurea at most 2 times a week which would not account for the macrocytosis.  I checked his TSH, a B12 and folic acid level which were all normal.  He should follow-up with his primary care physician for further evaluation of the macrocytic anemia.  Patient also had symptomatic anemia and was transfused 1 unit of blood prior to discharge. 6. Left infraclavicular soft tissue swelling: The patient had some mild painful soft tissue swelling in the infraclavicular region on the left side without any signs of infection.  It was felt to be secondary to trauma to the soft tissue in that area.   However the patient demanded the chest x-ray and actually became rather belligerent and threatening with regard to the x-ray being performed.  I tried multiple ways to explain to the patient that an x-ray would not reveal any significant clinical information regarding the soft tissue however given the threatening advances of both the patient and his cast who is in the room at the time and on the demand of the patient the chest x-ray was ordered which of course gave no information regarding the soft tissue.  The swelling and pain resolved spontaneously without incident. 7. Abnormal chest x-ray: The chest x-ray that was performed at the demand of the patient showed findings that were consistent with atelectasis versus emerging pneumonia.  The patient had no clinical constellation of symptoms that were consistent with a pneumonia.  However a diagnosis of atelectasis was more likely that the patient  had been immobilized and was refusing to use incentive spirometer what to do any walking.  The patient was encouraged to use the incentive spirometer however continued to refuse.  At the time of discharge the patient's oxygen saturations were 100% on room air. 8. Psychosocial: The patient was very demanding of treatments that were inappropriate and was additionally verbally abusive and threatening when he did not received the treatments demanded.  The patient asked that I speak with his mother Jola Babinski [phone]) to explain his current condition. In my conversation with his mother she admitted that she had advised the patient to ask for an x-ray.  His mother noted that she had had similar experiences with him being verbally abusive and threatening to the point where she felt that it would be unsafe for him to continue to stay in her home currently.  She expressed her gratitude for the care that has been provided for her son.  I advised that the patient should likely see his primary care physician and pursue  psychotherapy and an evaluation by psychiatry which he refused while hospitalized.  Disposition and Follow-up: The patient is discharged home in good condition and is to follow-up with his primary care physician Dr. Ronne Binning in 1 week.  The patient has not followed up with his nephrologist and an appointment was made for him to follow-up with nephrology on 01/30/2017 is 1:45 PM.  Discharge Instructions    Activity as tolerated - No restrictions   Complete by:  As directed    Diet general   Complete by:  As directed    Low potassium      DISCHARGE EXAM:  General: Alert, awake, oriented x3, in mild distress.  HEENT: [place]/AT PEERL, EOMI, anicteric Neck: Trachea midline, no masses, no thyromegal,y no JVD, no carotid bruit OROPHARYNX: Moist, No exudate/ erythema/lesions.  Heart: Regular rate and rhythm, II-VI nonradiating systolic ejection murmur at the base.  There were no rubs, gallops or S3. PMI non-displaced. Exam reveals no decreased pulses. Pulmonary/Chest: Normal effort. Breath sounds normal. No. Apnea. Clear to auscultation,no stridor,  no wheezing and no rhonchi noted. No respiratory distress and no tenderness noted. Abdomen: Soft, nontender, nondistended, normal bowel sounds, no masses no hepatosplenomegaly noted. No fluid wave and no ascites. There is no guarding or rebound. Neuro: Alert and oriented to person, place and time. Normal motor skills, Displays no atrophy or tremors and exhibits normal muscle tone.  No focal neurological deficits noted cranial nerves II through XII grossly intact. No sensory deficit noted.  Strength at baseline in bilateral upper and lower extremities. Gait normal. Musculoskeletal: No warmth swelling or erythema around joints, no spinal tenderness noted. Psychiatric: Patient alert and oriented x3.  He exhibits poor insight and judgement with regard to his disease and with regard to health care in general Skin: Skin is warm and dry. No bruising, no  ecchymosis and no rash noted. Pt is not diaphoretic. No erythema. No pallor   Blood pressure 110/66, pulse 97, temperature 98 F (36.7 C), temperature source Oral, resp. rate 10, height 5\' 3"  (1.6 m), weight 53.4 kg (117 lb 11.2 oz), SpO2 98 %.  Recent Labs    12/19/16 0409  12/20/16 1041 12/21/16 0422  NA 131*  --  134* 132*  K 3.1*  --  4.0 3.8  CL 103  --  107 105  CO2 18*  --  18* 20*  GLUCOSE 121*  --  123* 135*  BUN 33*  --  25* 21*  CREATININE 1.45*   < > 1.48* 1.48*  CALCIUM 8.5*  --  9.0 8.8*  MG 2.2  --   --   --   PHOS 2.9  --   --   --    < > = values in this interval not displayed.   Recent Labs    12/19/16 0409  ALBUMIN 2.9*   No results for input(s): LIPASE, AMYLASE in the last 72 hours. Recent Labs    12/20/16 1041 12/21/16 0422  WBC 11.3* 11.1*  NEUTROABS 6.8 6.3  HGB 5.6* 7.5*  HCT 15.2* 21.1*  MCV 100.0 96.8  PLT 265 314     Total time spent including face to face and decision making was greater than 30 minutes  Signed: Nahiara Kretzschmar A. 12/21/2016, 11:43 AM

## 2016-12-21 NOTE — Procedures (Signed)
Patient's tunneled right internal jugular PICC was removed in its entirety without immediate complications.  Vaseline gauze dressing applied over site.

## 2016-12-21 NOTE — Progress Notes (Signed)
Patient had been receiving IV Dilaudid PCA. Discontinued at 1130. Patient had to wait on IR to discontinue RIJ and for discharge paperwork. After these were completed at 1440, patient was adamant about leaving. His car is in the parking lot and he wants to drive home. Educated patient on safety of driving after receiving pain medication. Encouraged patient to wait a little while longer but he refused.

## 2016-12-26 DIAGNOSIS — Z79891 Long term (current) use of opiate analgesic: Secondary | ICD-10-CM | POA: Diagnosis not present

## 2017-01-18 ENCOUNTER — Other Ambulatory Visit: Payer: Self-pay | Admitting: Family Medicine

## 2017-01-25 DIAGNOSIS — Z79891 Long term (current) use of opiate analgesic: Secondary | ICD-10-CM | POA: Diagnosis not present

## 2017-01-30 DIAGNOSIS — R945 Abnormal results of liver function studies: Secondary | ICD-10-CM | POA: Diagnosis not present

## 2017-01-30 DIAGNOSIS — E872 Acidosis: Secondary | ICD-10-CM | POA: Diagnosis not present

## 2017-01-30 DIAGNOSIS — D571 Sickle-cell disease without crisis: Secondary | ICD-10-CM | POA: Diagnosis not present

## 2017-01-30 DIAGNOSIS — N183 Chronic kidney disease, stage 3 (moderate): Secondary | ICD-10-CM | POA: Diagnosis not present

## 2017-01-30 DIAGNOSIS — N08 Glomerular disorders in diseases classified elsewhere: Secondary | ICD-10-CM | POA: Diagnosis not present

## 2017-01-30 DIAGNOSIS — Z720 Tobacco use: Secondary | ICD-10-CM | POA: Diagnosis not present

## 2017-01-30 DIAGNOSIS — D7589 Other specified diseases of blood and blood-forming organs: Secondary | ICD-10-CM | POA: Diagnosis not present

## 2017-02-21 ENCOUNTER — Other Ambulatory Visit: Payer: Self-pay | Admitting: Internal Medicine

## 2017-02-27 ENCOUNTER — Emergency Department (HOSPITAL_COMMUNITY): Payer: Medicare Other

## 2017-02-27 ENCOUNTER — Inpatient Hospital Stay (HOSPITAL_COMMUNITY)
Admission: EM | Admit: 2017-02-27 | Discharge: 2017-03-04 | DRG: 812 | Disposition: A | Discharge status: Home / Self Care | Payer: Medicare Other | Attending: Internal Medicine | Admitting: Internal Medicine

## 2017-02-27 ENCOUNTER — Encounter (HOSPITAL_COMMUNITY): Payer: Self-pay

## 2017-02-27 ENCOUNTER — Other Ambulatory Visit: Payer: Self-pay

## 2017-02-27 DIAGNOSIS — G930 Cerebral cysts: Secondary | ICD-10-CM | POA: Diagnosis present

## 2017-02-27 DIAGNOSIS — D5700 Hb-SS disease with crisis, unspecified: Principal | ICD-10-CM | POA: Diagnosis present

## 2017-02-27 DIAGNOSIS — N182 Chronic kidney disease, stage 2 (mild): Secondary | ICD-10-CM | POA: Diagnosis not present

## 2017-02-27 DIAGNOSIS — R197 Diarrhea, unspecified: Secondary | ICD-10-CM

## 2017-02-27 DIAGNOSIS — K7689 Other specified diseases of liver: Secondary | ICD-10-CM | POA: Diagnosis not present

## 2017-02-27 DIAGNOSIS — F17210 Nicotine dependence, cigarettes, uncomplicated: Secondary | ICD-10-CM | POA: Diagnosis present

## 2017-02-27 DIAGNOSIS — D5701 Hb-SS disease with acute chest syndrome: Secondary | ICD-10-CM | POA: Diagnosis not present

## 2017-02-27 DIAGNOSIS — J181 Lobar pneumonia, unspecified organism: Secondary | ICD-10-CM

## 2017-02-27 DIAGNOSIS — R9431 Abnormal electrocardiogram [ECG] [EKG]: Secondary | ICD-10-CM

## 2017-02-27 DIAGNOSIS — R7401 Elevation of levels of liver transaminase levels: Secondary | ICD-10-CM

## 2017-02-27 DIAGNOSIS — Z886 Allergy status to analgesic agent status: Secondary | ICD-10-CM

## 2017-02-27 DIAGNOSIS — J189 Pneumonia, unspecified organism: Secondary | ICD-10-CM | POA: Diagnosis not present

## 2017-02-27 DIAGNOSIS — Z79899 Other long term (current) drug therapy: Secondary | ICD-10-CM

## 2017-02-27 DIAGNOSIS — Y95 Nosocomial condition: Secondary | ICD-10-CM | POA: Diagnosis present

## 2017-02-27 DIAGNOSIS — E872 Acidosis: Secondary | ICD-10-CM | POA: Diagnosis present

## 2017-02-27 DIAGNOSIS — R7989 Other specified abnormal findings of blood chemistry: Secondary | ICD-10-CM | POA: Diagnosis not present

## 2017-02-27 DIAGNOSIS — I361 Nonrheumatic tricuspid (valve) insufficiency: Secondary | ICD-10-CM | POA: Diagnosis not present

## 2017-02-27 DIAGNOSIS — R0902 Hypoxemia: Secondary | ICD-10-CM | POA: Diagnosis present

## 2017-02-27 DIAGNOSIS — N184 Chronic kidney disease, stage 4 (severe): Secondary | ICD-10-CM | POA: Diagnosis present

## 2017-02-27 DIAGNOSIS — R079 Chest pain, unspecified: Secondary | ICD-10-CM | POA: Diagnosis not present

## 2017-02-27 DIAGNOSIS — R000 Tachycardia, unspecified: Secondary | ICD-10-CM

## 2017-02-27 DIAGNOSIS — D649 Anemia, unspecified: Secondary | ICD-10-CM

## 2017-02-27 DIAGNOSIS — F1123 Opioid dependence with withdrawal: Secondary | ICD-10-CM | POA: Diagnosis not present

## 2017-02-27 DIAGNOSIS — N183 Chronic kidney disease, stage 3 (moderate): Secondary | ICD-10-CM | POA: Diagnosis present

## 2017-02-27 DIAGNOSIS — N189 Chronic kidney disease, unspecified: Secondary | ICD-10-CM

## 2017-02-27 DIAGNOSIS — K219 Gastro-esophageal reflux disease without esophagitis: Secondary | ICD-10-CM | POA: Diagnosis present

## 2017-02-27 DIAGNOSIS — Z9049 Acquired absence of other specified parts of digestive tract: Secondary | ICD-10-CM | POA: Diagnosis not present

## 2017-02-27 DIAGNOSIS — Z885 Allergy status to narcotic agent status: Secondary | ICD-10-CM | POA: Diagnosis not present

## 2017-02-27 DIAGNOSIS — I4581 Long QT syndrome: Secondary | ICD-10-CM | POA: Diagnosis not present

## 2017-02-27 DIAGNOSIS — N62 Hypertrophy of breast: Secondary | ICD-10-CM | POA: Diagnosis present

## 2017-02-27 DIAGNOSIS — R748 Abnormal levels of other serum enzymes: Secondary | ICD-10-CM | POA: Diagnosis not present

## 2017-02-27 DIAGNOSIS — R05 Cough: Secondary | ICD-10-CM | POA: Diagnosis not present

## 2017-02-27 DIAGNOSIS — D638 Anemia in other chronic diseases classified elsewhere: Secondary | ICD-10-CM | POA: Diagnosis not present

## 2017-02-27 DIAGNOSIS — R945 Abnormal results of liver function studies: Secondary | ICD-10-CM | POA: Diagnosis not present

## 2017-02-27 HISTORY — DX: Chronic kidney disease, stage 2 (mild): N18.2

## 2017-02-27 HISTORY — DX: Tachycardia, unspecified: R00.0

## 2017-02-27 LAB — TROPONIN I
TROPONIN I: 0.07 ng/mL — AB (ref <0.03)
Troponin I: 0.06 ng/mL (ref <0.03)

## 2017-02-27 LAB — URINALYSIS, ROUTINE W REFLEX MICROSCOPIC
BILIRUBIN URINE: NEGATIVE
Bacteria, UA: NONE SEEN
GLUCOSE, UA: 150 mg/dL — AB
Ketones, ur: NEGATIVE mg/dL
LEUKOCYTES UA: NEGATIVE
Nitrite: NEGATIVE
PH: 7 (ref 5.0–8.0)
RBC / HPF: NONE SEEN RBC/hpf (ref 0–5)
SQUAMOUS EPITHELIAL / LPF: NONE SEEN
Specific Gravity, Urine: 1.029 (ref 1.005–1.030)

## 2017-02-27 LAB — CBC WITH DIFFERENTIAL/PLATELET
BASOS PCT: 0 %
Basophils Absolute: 0 10*3/uL (ref 0.0–0.1)
EOS ABS: 0.1 10*3/uL (ref 0.0–0.7)
Eosinophils Relative: 1 %
HEMATOCRIT: 21.8 % — AB (ref 39.0–52.0)
HEMOGLOBIN: 7.8 g/dL — AB (ref 13.0–17.0)
LYMPHS PCT: 30 %
Lymphs Abs: 4.4 10*3/uL — ABNORMAL HIGH (ref 0.7–4.0)
MCH: 36.1 pg — AB (ref 26.0–34.0)
MCHC: 35.8 g/dL (ref 30.0–36.0)
MCV: 100.9 fL — AB (ref 78.0–100.0)
MONOS PCT: 9 %
Monocytes Absolute: 1.3 10*3/uL — ABNORMAL HIGH (ref 0.1–1.0)
NRBC: 2 /100{WBCs} — AB
Neutro Abs: 9 10*3/uL — ABNORMAL HIGH (ref 1.7–7.7)
Neutrophils Relative %: 60 %
Platelets: 126 10*3/uL — ABNORMAL LOW (ref 150–400)
RBC: 2.16 MIL/uL — ABNORMAL LOW (ref 4.22–5.81)
WBC: 14.8 10*3/uL — ABNORMAL HIGH (ref 4.0–10.5)

## 2017-02-27 LAB — COMPREHENSIVE METABOLIC PANEL
ALBUMIN: 1.6 g/dL — AB (ref 3.5–5.0)
ALT: 41 U/L (ref 17–63)
ANION GAP: 3 — AB (ref 5–15)
AST: 89 U/L — AB (ref 15–41)
Alkaline Phosphatase: 216 U/L — ABNORMAL HIGH (ref 38–126)
BUN: 11 mg/dL (ref 6–20)
CO2: 23 mmol/L (ref 22–32)
Calcium: 7.7 mg/dL — ABNORMAL LOW (ref 8.9–10.3)
Chloride: 111 mmol/L (ref 101–111)
Creatinine, Ser: 1.65 mg/dL — ABNORMAL HIGH (ref 0.61–1.24)
GFR calc Af Amer: >60 mL/min (ref >60)
GFR calc non Af Amer: 53 mL/min — ABNORMAL LOW (ref >60)
GLUCOSE: 99 mg/dL (ref 65–99)
POTASSIUM: 4.3 mmol/L (ref 3.5–5.1)
Sodium: 137 mmol/L (ref 135–145)
Total Bilirubin: 3.4 mg/dL — ABNORMAL HIGH (ref 0.3–1.2)
Total Protein: 5.1 g/dL — ABNORMAL LOW (ref 6.5–8.1)

## 2017-02-27 LAB — RETICULOCYTES: RBC.: 2.16 MIL/uL — ABNORMAL LOW (ref 4.22–5.81)

## 2017-02-27 LAB — LACTIC ACID, PLASMA: Lactic Acid, Venous: 0.8 mmol/L (ref 0.5–1.9)

## 2017-02-27 LAB — INFLUENZA PANEL BY PCR (TYPE A & B)
Influenza A By PCR: NEGATIVE
Influenza B By PCR: NEGATIVE

## 2017-02-27 LAB — RAPID URINE DRUG SCREEN, HOSP PERFORMED
Amphetamines: NOT DETECTED
BARBITURATES: NOT DETECTED
Benzodiazepines: NOT DETECTED
COCAINE: NOT DETECTED
Opiates: POSITIVE — AB
TETRAHYDROCANNABINOL: POSITIVE — AB

## 2017-02-27 MED ORDER — HYDROMORPHONE HCL 2 MG/ML IJ SOLN: 2.0000 mg | INTRAMUSCULAR | Status: AC

## 2017-02-27 MED ORDER — DEXTROSE 5 % IV SOLN
1.0000 g | Freq: Once | INTRAVENOUS | Status: AC
  Administered 2017-02-27: 1 g via INTRAVENOUS
  Filled 2017-02-27: qty 1

## 2017-02-27 MED ORDER — HYDROMORPHONE HCL 2 MG/ML IJ SOLN
2.0000 mg | INTRAMUSCULAR | Status: AC
  Administered 2017-02-27: 2 mg via INTRAVENOUS
  Filled 2017-02-27: qty 1

## 2017-02-27 MED ORDER — SENNOSIDES-DOCUSATE SODIUM 8.6-50 MG PO TABS
1.0000 | ORAL_TABLET | Freq: Two times a day (BID) | ORAL | Status: DC
  Filled 2017-02-27 (×9): qty 1

## 2017-02-27 MED ORDER — VANCOMYCIN HCL IN DEXTROSE 1-5 GM/200ML-% IV SOLN
1000.0000 mg | Freq: Once | INTRAVENOUS | Status: AC
  Administered 2017-02-27: 1000 mg via INTRAVENOUS
  Filled 2017-02-27: qty 200

## 2017-02-27 MED ORDER — DOCUSATE SODIUM 100 MG PO CAPS
100.0000 mg | ORAL_CAPSULE | Freq: Every day | ORAL | Status: DC
  Filled 2017-02-27 (×5): qty 1

## 2017-02-27 MED ORDER — PRO-STAT SUGAR FREE PO LIQD
30.0000 mL | Freq: Two times a day (BID) | ORAL | Status: DC
  Filled 2017-02-27 (×9): qty 30

## 2017-02-27 MED ORDER — IOPAMIDOL (ISOVUE-370) INJECTION 76%
INTRAVENOUS | Status: AC
  Administered 2017-02-27: 100 mL
  Filled 2017-02-27: qty 100

## 2017-02-27 MED ORDER — HYDROMORPHONE 1 MG/ML IV SOLN
INTRAVENOUS | Status: DC
  Administered 2017-02-27: 1 mg via INTRAVENOUS
  Administered 2017-02-28: 0 mg via INTRAVENOUS
  Administered 2017-02-28: 2 mg via INTRAVENOUS
  Administered 2017-02-28: 4 mg via INTRAVENOUS
  Filled 2017-02-27: qty 25

## 2017-02-27 MED ORDER — DIPHENHYDRAMINE HCL 50 MG/ML IJ SOLN
25.0000 mg | Freq: Once | INTRAMUSCULAR | Status: AC
  Administered 2017-02-27: 25 mg via INTRAVENOUS
  Filled 2017-02-27: qty 1

## 2017-02-27 MED ORDER — PROCHLORPERAZINE EDISYLATE 5 MG/ML IJ SOLN
10.0000 mg | Freq: Four times a day (QID) | INTRAMUSCULAR | Status: DC | PRN
  Administered 2017-03-01: 10 mg via INTRAVENOUS
  Filled 2017-02-27: qty 2

## 2017-02-27 MED ORDER — CEFEPIME HCL 1 G IJ SOLR
1.0000 g | Freq: Two times a day (BID) | INTRAMUSCULAR | Status: DC
  Administered 2017-02-28: 1 g via INTRAVENOUS
  Filled 2017-02-27 (×2): qty 1

## 2017-02-27 MED ORDER — POLYETHYLENE GLYCOL 3350 17 G PO PACK: 17.0000 g | PACK | Freq: Every day | ORAL | Status: DC | PRN

## 2017-02-27 MED ORDER — DEXTROSE-NACL 5-0.45 % IV SOLN
INTRAVENOUS | Status: DC
  Administered 2017-02-28 – 2017-03-01 (×3): via INTRAVENOUS

## 2017-02-27 MED ORDER — SODIUM CHLORIDE 0.45 % IV SOLN
INTRAVENOUS | Status: DC
  Administered 2017-02-27: 14:00:00 via INTRAVENOUS

## 2017-02-27 MED ORDER — KETOROLAC TROMETHAMINE 15 MG/ML IJ SOLN
15.0000 mg | INTRAMUSCULAR | Status: AC
  Administered 2017-02-27: 15 mg via INTRAVENOUS
  Filled 2017-02-27: qty 1

## 2017-02-27 MED ORDER — SODIUM CHLORIDE 0.9% FLUSH: 9.0000 mL | INTRAVENOUS | Status: DC | PRN

## 2017-02-27 MED ORDER — DIPHENHYDRAMINE HCL 25 MG PO CAPS: 25.0000 mg | ORAL_CAPSULE | ORAL | Status: DC | PRN

## 2017-02-27 MED ORDER — VANCOMYCIN HCL IN DEXTROSE 750-5 MG/150ML-% IV SOLN
750.0000 mg | INTRAVENOUS | Status: DC
  Administered 2017-02-28: 750 mg via INTRAVENOUS
  Filled 2017-02-27: qty 150

## 2017-02-27 MED ORDER — SODIUM CHLORIDE 0.9 % IV SOLN
25.0000 mg | INTRAVENOUS | Status: DC | PRN
  Filled 2017-02-27: qty 0.5

## 2017-02-27 MED ORDER — ENOXAPARIN SODIUM 40 MG/0.4ML ~~LOC~~ SOLN
40.0000 mg | SUBCUTANEOUS | Status: DC
  Administered 2017-02-28 – 2017-03-04 (×5): 40 mg via SUBCUTANEOUS
  Filled 2017-02-27 (×5): qty 0.4

## 2017-02-27 MED ORDER — SODIUM BICARBONATE 650 MG PO TABS
650.0000 mg | ORAL_TABLET | Freq: Three times a day (TID) | ORAL | Status: DC
  Administered 2017-02-28 – 2017-03-04 (×14): 650 mg via ORAL
  Filled 2017-02-27 (×14): qty 1

## 2017-02-27 MED ORDER — NALOXONE HCL 0.4 MG/ML IJ SOLN: 0.4000 mg | INTRAMUSCULAR | Status: DC | PRN

## 2017-02-27 MED ORDER — FOLIC ACID 1 MG PO TABS
1.0000 mg | ORAL_TABLET | Freq: Every day | ORAL | Status: DC
  Administered 2017-02-28 – 2017-03-04 (×5): 1 mg via ORAL
  Filled 2017-02-27 (×5): qty 1

## 2017-02-27 NOTE — ED Notes (Signed)
Pt reports his pain is still a 10/10, but pt is very drowsy and falling asleep during conversation with RN. Pt noted to be scratching everywhere. Pt reports he feels cold and was given a warm blanket.  Pt alone in room at this time.

## 2017-02-27 NOTE — Progress Notes (Signed)
A consult was received from an ED physician for vancomycin per pharmacy dosing.  The patient's profile has been reviewed for ht/wt/allergies/indication/available labs.   A one time order has been placed for vancomycin 1gm IV x 1.    Further antibiotics/pharmacy consults should be ordered by admitting physician if indicated.                       Thank you, Clovis Riley 02/27/2017  6:09 PM

## 2017-02-27 NOTE — Progress Notes (Signed)
Pharmacy Antibiotic Note  [name] [name]. is a 36 y.o. male admitted on 02/27/2017 with pneumonia.  Pharmacy has been consulted for vancomycin and cefepime dosing.  Today, 02/27/2017  Renal: SCr elevated - close to previous values  Plan:   vancomycin 740mg  IV q24h - start 10am 1/16  Check levels if remains on vancomycin > 4 days  Cefepime 1gm IV q12h  Follow renal function  Follow-up ability to de-escalate abx  Height: 5\' 4"  (162.6 cm) Weight: 120 lb (54.4 kg) IBW/kg (Calculated) : 59.2  Temp (24hrs), Avg:97.9 F (36.6 C), Min:97.9 F (36.6 C), Max:97.9 F (36.6 C)  Recent Labs  Lab 02/27/17 1243 02/27/17 1301  WBC 14.8*  --   CREATININE 1.65*  --   LATICACIDVEN  --  0.8    Estimated Creatinine Clearance: 48.5 mL/min (A) (by C-G formula based on SCr of 1.65 mg/dL (H)).    Allergies  Allergen Reactions  . Nsaids   . Morphine And Related Other (See Comments)    "real bad headaches"    Antimicrobials this admission: 1/15 vanco >> 1/15 cefepime >>  Dose adjustments this admission:  Microbiology results: 1/15 BCx:  1/15 Strep Ag 1/15 Legionella Ag 1/15 influenza PCR: Neg 1/15 resp virus panel:  1/15 C. Diff:   Thank you for allowing pharmacy to be a part of this patient's care.  Doreene Eland, PharmD, BCPS.   Pager: [phone] 02/27/2017 8:25 PM

## 2017-02-27 NOTE — ED Provider Notes (Signed)
[facility] DEPT Provider Note   CSN: [id number] Arrival date & time: 02/27/17  1020     History   Chief Complaint Chief Complaint  Patient presents with  . Sickle Cell Pain Crisis  . Chest Pain  . Shortness of Breath    HPI [name] [name]. is a 37 y.o. male.  HPI   [name] [name]. is a 37 y.o. male, with a history of sickle cell anemia, sickle cell nephropathy, and GERD, presenting to the ED with pain consistent with sickle cell pain as well as cough, chills, shortness of breath, N/V/D for the past few days.  He complains of aching, 10/10, pain to the lower back, bilateral lower extremities, as well as in the chest with coughing. Legs and back typical of sickle cell pain. Hasn't taken home pain meds for two days because he ran out.   Denies hemoptysis, peripheral edema, fever, abdominal pain, rashes, or any other complaints. No history of PE/DVT.  Past Medical History:  Diagnosis Date  . Bacterial pneumonia ~ 2012   "caught it here in the hospital" (09/25/2012)  . Chronic kidney disease    "from my sickle cell" (09/25/2012)  . Cyst of brain    "2 really small ones in the back of my head; inside; saw them w/MRI" (09/25/2012)  . GERD (gastroesophageal reflux disease)    "after I eat alot of spicey foods" (09/25/2012)  . Gynecomastia, male 07/10/2012  . History of blood transfusion    "always related to sickle cell crisis" (09/25/2012)  . Migraines    "take RX qd to prevent them" (09/25/2012)  . Sickle cell anemia (HCC)   . Sickle cell crisis ([place]) 09/25/2012  . Sickle cell nephropathy ([place]) 07/10/2012  . Tachycardia with heart rate 121-140 beats per minute with ambulation 08/04/2016    Patient Active Problem List   Diagnosis Date Noted  . Soft tissue swelling of chest wall 12/18/2016  . Hypoxia   . Acute kidney injury superimposed on chronic kidney disease ([place]) 12/13/2016  . Vasoocclusive sickle cell crisis ([place]) 12/13/2016  .  Sickle cell crisis ([place]) 10/14/2016  . Hyponatremia 10/14/2016  . Tachycardia with heart rate 121-140 beats per minute with ambulation 08/04/2016  . Metabolic acidosis 37/62/8315  . Anemia of chronic disease 08/02/2016  . Macrocytosis due to Hydroxyurea 08/02/2016  . Chronic, continuous use of opioids 08/30/2012  . Chronic headaches 07/10/2012  . Gynecomastia, male 07/10/2012  . Sickle cell nephropathy ([place]) 07/10/2012  . Systolic murmur 17/61/6073  . Migraine 11/26/2009  . CHRONIC KIDNEY DISEASE STAGE II (MILD) 03/06/2009  . Sickle cell disease, type SS.  06/18/2008  . TOBACCO ABUSE 05/22/2007    Past Surgical History:  Procedure Laterality Date  . CHOLECYSTECTOMY  ~ 2012  . IR FLUORO GUIDE CV LINE RIGHT  12/17/2016  . IR REMOVAL TUN CV CATH W/O FL  12/21/2016  . IR US GUIDE VASC ACCESS RIGHT  12/17/2016       Home Medications    Prior to Admission medications   Medication Sig Start Date End Date Taking? Authorizing Provider  enalapril (VASOTEC) 5 MG tablet Take 5 mg by mouth daily. 12/09/16  Yes [provider]  folic acid (FOLVITE) 1 MG tablet Take 1 tablet (1 mg total) daily by mouth. 12/22/16  Yes Leana Gamer, MD  hydroxyurea (HYDREA) 500 MG capsule TAKE 3 CAPSULES (1,500 MG TOTAL) BY MOUTH DAILY. Hold taking this medication until discussed with Dr. Alyson Ingles as patient  is not taking regularly and the dose which he is prescribed when taken regularly has demonstrated bone marrow supression 12/21/16  Yes Leana Gamer, MD  nortriptyline (PAMELOR) 25 MG capsule TAKE 1 CAPSULE (25 MG TOTAL) BY MOUTH AT BEDTIME. 01/18/17  Yes Donnamae Jude, MD  Oxycodone HCl 10 MG TABS Take 10 mg by mouth every 6 (six) hours as needed.   Yes [provider]  sodium bicarbonate 650 MG tablet Take 1 tablet (650 mg total) 3 (three) times daily by mouth. 12/21/16  Yes Leana Gamer, MD  Menthol-Methyl Salicylate (MUSCLE RUB) 10-15 % CREA Apply 1 application as  needed topically for muscle pain. Patient not taking: Reported on 02/27/2017 12/21/16   Leana Gamer, MD    Family History Family History  Problem Relation Age of Onset  . Breast cancer Mother     Social History Social History   Tobacco Use  . Smoking status: Current Some Day Smoker    Types: Cigarettes  . Smokeless tobacco: Never Used  . Tobacco comment: 09/25/2012 "I don't buy cigarettes; bum one from friends q now and then"  Substance Use Topics  . Alcohol use: Yes    Alcohol/week: 0.0 oz    Comment: occasionally  . Drug use: No    Comment: 09/25/2012 "weed was my biggest problem; stopped that ~ 1 1/2 months ago; I don't do cocaine; can't say I never have though"     Allergies   Nsaids and Morphine and related   Review of Systems Review of Systems  Constitutional: Positive for chills. Negative for fever.  HENT: Negative for sore throat.   Respiratory: Positive for cough and shortness of breath.   Cardiovascular: Positive for chest pain. Negative for leg swelling.  Gastrointestinal: Positive for diarrhea, nausea and vomiting. Negative for abdominal pain and blood in stool.  Musculoskeletal: Negative for neck pain and neck stiffness.  Skin: Negative for rash.  All other systems reviewed and are negative.    Physical Exam Updated Vital Signs BP 102/70   Pulse (!) 126   Temp 97.9 F (36.6 C) (Oral)   Resp (!) 21   Ht 5\' 4"  (1.626 m)   Wt 54.4 kg (120 lb)   SpO2 100%   BMI 20.60 kg/m   Physical Exam  Constitutional: He appears well-developed and well-nourished. No distress.  HENT:  Head: Normocephalic and atraumatic.  Eyes: Conjunctivae are normal.  Neck: Neck supple.  Cardiovascular: Regular rhythm, normal heart sounds and intact distal pulses. Tachycardia present.  Pulmonary/Chest: Effort normal and breath sounds normal. No respiratory distress.  No increased work of breathing.  Patient speaks in full sentences without difficulty.  Abdominal:  Soft. There is no tenderness. There is no guarding.  Musculoskeletal: He exhibits no edema.  Lymphadenopathy:    He has no cervical adenopathy.  Neurological: He is alert.  Skin: Skin is warm and dry. He is not diaphoretic.  Psychiatric: He has a normal mood and affect. His behavior is normal.  Nursing note and vitals reviewed.    ED Treatments / Results  Labs (all labs ordered are listed, but only abnormal results are displayed) Labs Reviewed  COMPREHENSIVE METABOLIC PANEL - Abnormal; Notable for the following components:      Result Value   Creatinine, Ser 1.65 (*)    Calcium 7.7 (*)    Total Protein 5.1 (*)    Albumin 1.6 (*)    AST 89 (*)    Alkaline Phosphatase 216 (*)  Total Bilirubin 3.4 (*)    GFR calc non Af Amer 53 (*)    Anion gap 3 (*)    All other components within normal limits  CBC WITH DIFFERENTIAL/PLATELET - Abnormal; Notable for the following components:   WBC 14.8 (*)    RBC 2.16 (*)    Hemoglobin 7.8 (*)    HCT 21.8 (*)    MCV 100.9 (*)    MCH 36.1 (*)    Platelets 126 (*)    nRBC 2 (*)    Neutro Abs 9.0 (*)    Lymphs Abs 4.4 (*)    Monocytes Absolute 1.3 (*)    All other components within normal limits  RETICULOCYTES - Abnormal; Notable for the following components:   Retic Ct Pct >23.0 (*)    RBC. 2.16 (*)    All other components within normal limits  TROPONIN I - Abnormal; Notable for the following components:   Troponin I 0.06 (*)    All other components within normal limits  TROPONIN I - Abnormal; Notable for the following components:   Troponin I 0.07 (*)    All other components within normal limits  LACTIC ACID, PLASMA  INFLUENZA PANEL BY PCR (TYPE A & B)    EKG  EKG Interpretation  Date/Time:  Tuesday February 27 2017 10:37:42 EST Ventricular Rate:  123 PR Interval:    QRS Duration: 71 QT Interval:  387 QTC Calculation: 554 R Axis:   71 Text Interpretation:  Sinus tachycardia Borderline T abnormalities, diffuse leads  Prolonged QT interval Confirmed by Virgel Manifold [phone]) on 02/27/2017 10:46:27 AM       Radiology Dg Chest 2 View  Result Date: 02/27/2017 CLINICAL DATA:  Cough and chest pain EXAM: CHEST  2 VIEW COMPARISON:  Chest radiograph 12/19/2016 FINDINGS: Previously seen central venous catheter is no longer present. The lungs are mildly scarred bilaterally, particularly in the left base, but there is no focal consolidation. No pulmonary edema, pleural effusion or pneumothorax. IMPRESSION: No active cardiopulmonary disease. Electronically Signed   By: Ulyses Jarred M.D.   On: 02/27/2017 13:35   Ct Angio Chest Pe W And/or Wo Contrast  Result Date: 02/27/2017 CLINICAL DATA:  Cough and pain EXAM: CT ANGIOGRAPHY CHEST WITH CONTRAST TECHNIQUE: Multidetector CT imaging of the chest was performed using the standard protocol during bolus administration of intravenous contrast. Multiplanar CT image reconstructions and MIPs were obtained to evaluate the vascular anatomy. CONTRAST:  12mL ISOVUE-370 IOPAMIDOL (ISOVUE-370) INJECTION 76% COMPARISON:  None. FINDINGS: Cardiovascular: There are no filling defects in the pulmonary arterial tree to suggest acute pulmonary thromboembolism. There is no evidence of aortic dissection or aneurysm. Great vessels are grossly patent. Mediastinum/Nodes: No abnormal mediastinal adenopathy. No pericardial effusion. Lungs/Pleura: No pneumothorax. No pleural effusion. Lungs are under aerated. There are bilateral dependent heterogeneous opacities with the lungs, best characterized as ground-glass and streaky opacities. No dominant lung mass. There are cystic areas within the dependent lower lobes without obvious fibrotic change or honeycombing. Nonspecific patchy and ground-glass opacities in the right upper lobe on images 60 and 64 most consistent with an inflammatory process. Upper Abdomen: No acute abnormality. Musculoskeletal: There are endplate changes consistent with sickle cell disorder.  Review of the MIP images confirms the above findings. IMPRESSION: No evidence of acute pulmonary thromboembolism. There are bilateral dependent heterogeneous opacities in the lungs as well as patchy right upper lobe opacities. These findings are worrisome for an inflammatory process. Electronically Signed   By: Rodena Goldmann.D.  On: 02/27/2017 17:11    Procedures Procedures (including critical care time)  Medications Ordered in ED Medications  0.45 % sodium chloride infusion ( Intravenous New Bag/Given 02/27/17 1401)  HYDROmorphone (DILAUDID) injection 2 mg ( Intravenous See Alternative 02/27/17 1515)    Or  HYDROmorphone (DILAUDID) injection 2 mg (2 mg Subcutaneous Not Given 02/27/17 1515)  HYDROmorphone (DILAUDID) injection 2 mg (2 mg Intravenous Not Given 02/27/17 1552)    Or  HYDROmorphone (DILAUDID) injection 2 mg ( Subcutaneous See Alternative 02/27/17 1552)  ceFEPIme (MAXIPIME) 1 g in dextrose 5 % 50 mL IVPB (not administered)  ketorolac (TORADOL) 15 MG/ML injection 15 mg (15 mg Intravenous Given 02/27/17 1316)  HYDROmorphone (DILAUDID) injection 2 mg (2 mg Intravenous Given 02/27/17 1317)    Or  HYDROmorphone (DILAUDID) injection 2 mg ( Subcutaneous See Alternative 02/27/17 1317)  HYDROmorphone (DILAUDID) injection 2 mg (2 mg Intravenous Given 02/27/17 1359)    Or  HYDROmorphone (DILAUDID) injection 2 mg ( Subcutaneous See Alternative 02/27/17 1359)  diphenhydrAMINE (BENADRYL) injection 25 mg (25 mg Intravenous Given 02/27/17 1314)  diphenhydrAMINE (BENADRYL) injection 25 mg (25 mg Intravenous Given 02/27/17 1455)  iopamidol (ISOVUE-370) 76 % injection (100 mLs  Contrast Given 02/27/17 1636)     Initial Impression / Assessment and Plan / ED Course  I have reviewed the triage vital signs and the nursing notes.  Pertinent labs & imaging results that were available during my care of the patient were reviewed by me and considered in my medical decision making (see chart for  details).  Clinical Course as of Feb 27 1802  Tue Feb 27, 2017  1445 Patient reevaluated. Somnolent, but rousable. States his pain is well controlled.   [SJ]  1536 Spoke with Dr. Zigmund Daniel, sickle cell specialist.  States she thinks the elevated troponin may be from his sickle cell crisis. No need for anticoagulation. Recommends repeat troponin, if still elevated, admit for observation.  Otherwise, ask patient if he feels he can go home, if not, she will come see and admit the patient.   [SJ]    Clinical Course User Index [SJ] Lakiyah Arntson C, PA-C    Patient presents with pain consistent with his typical sickle cell pain as well as cough, nausea, vomiting, diarrhea, and chills.  Mildly elevated troponin, thought to be connected to either his renal function or his sickle cell pain crisis.  Trending troponins showed no significant increase.  CTA shows no PE, however, shows opacities concerning for pneumonia.  Admission for IV antibiotics and continue trending troponins.  End of shift patient care handoff report given to [facility], PA-C.   Vitals:   02/27/17 1046 02/27/17 1358 02/27/17 1455 02/27/17 1500  BP:  111/73 (!) 100/45   Pulse:  (!) 116 (!) 112 (!) 113  Resp:  15 18   Temp:      TempSrc:      SpO2:  100% 92% 95%  Weight: 54.4 kg (120 lb)     Height: 5\' 4"  (1.626 m)        Final Clinical Impressions(s) / ED Diagnoses   Final diagnoses:  Sickle cell pain crisis (HCC)  QT prolongation  HCAP (healthcare-associated pneumonia)    ED Discharge Orders    None       Layla Maw 02/27/17 Kara Pacer, MD 02/28/17 [phone]

## 2017-02-27 NOTE — ED Notes (Signed)
RN attempted IV access without success. Pt reports that last time he was seen he had to have one placed in his neck.

## 2017-02-27 NOTE — H&P (Addendum)
TRH H&P   Patient Demographics:    [name] [name], is a 37 y.o. male  MRN: [id number]   DOB - [birth year]  Admit Date - 02/27/2017  Outpatient Primary MD for the patient is [name], MD  Referring MD/NP/PA: Arlean Hopping  Outpatient Specialists:   Patient coming from: home  Chief Complaint  Patient presents with  . Sickle Cell Pain Crisis  . Chest Pain  . Shortness of Breath      HPI:    [name] [name]  is a 37 y.o. 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 Pt denies fever, chills, cp, palp, sob, n/v, abd pain, brbpr.  Pt c/o generalized pain c/w his sickle cell crisis and therefore came today.   In ED,   CTA chest IMPRESSION: No evidence of acute pulmonary thromboembolism.  There are bilateral dependent heterogeneous opacities in the lungs as well as patchy right upper lobe opacities. These findings are worrisome for an inflammatory process.  Wbc 14.8, Hgb 7.8, Plt 126 Na 137, K 4.3, Bun 11, Creatinine 1.65 Alb 1.6 Ast 89, Alt 41, Alk phos 216, T. Bili 3.4  Rectic >23 LA 0.8   Influenza panel negative  Trop 0.07, 0.06  EKG ST at 120, nl axis, nl int, no st-t changes c/w ischemia.   Pt will be admitted for pneumonia trop elevation, and abnormal lft and sickle cell crisis.        Review of systems:    In addition to the HPI above, No Fever-chills, No Headache, No changes with Vision or hearing, No problems swallowing food or Liquids, No Chest pain, Cough or Shortness of Breath, No Abdominal pain, No Nausea or Vommitting No Blood in stool or Urine, No dysuria, No new skin rashes or bruises, No new joints pains-aches,  No new weakness, tingling, numbness in any extremity, No recent weight gain or loss, No polyuria, polydypsia or  polyphagia, No significant Mental Stressors.  A full 10 point Review of Systems was done, except as stated above, all other Review of Systems were negative.   With Past History of the following :    Past Medical History:  Diagnosis Date  . Bacterial pneumonia ~ 2012   "caught it here in the hospital" (09/25/2012)  . Chronic kidney disease    "from my sickle cell" (09/25/2012)  . Cyst of brain    "2 really small ones in the back of my head; inside; saw them w/MRI" (09/25/2012)  . GERD (gastroesophageal reflux disease)    "after I eat alot of spicey foods" (09/25/2012)  . Gynecomastia, male 07/10/2012  . History of blood transfusion    "always related to sickle cell crisis" (09/25/2012)  . Migraines    "take RX qd to prevent them" (09/25/2012)  . Sickle cell anemia (  [place])   . Sickle cell crisis ([place]) 09/25/2012  . Sickle cell nephropathy ([place]) 07/10/2012  . Tachycardia with heart rate 121-140 beats per minute with ambulation 08/04/2016      Past Surgical History:  Procedure Laterality Date  . CHOLECYSTECTOMY  ~ 2012  . IR FLUORO GUIDE CV LINE RIGHT  12/17/2016  . IR REMOVAL TUN CV CATH W/O FL  12/21/2016  . IR US GUIDE VASC ACCESS RIGHT  12/17/2016      Social History:     Social History   Tobacco Use  . Smoking status: Current Some Day Smoker    Types: Cigarettes  . Smokeless tobacco: Never Used  . Tobacco comment: 09/25/2012 "I don't buy cigarettes; bum one from friends q now and then"  Substance Use Topics  . Alcohol use: Yes    Alcohol/week: 0.0 oz    Comment: occasionally     Lives - at home  Mobility - walks by self   Family History :     Family History  Problem Relation Age of Onset  . Breast cancer Mother       Home Medications:   Prior to Admission medications   Medication Sig Start Date End Date Taking? Authorizing Provider  enalapril (VASOTEC) 5 MG tablet Take 5 mg by mouth daily. 12/09/16  Yes [provider]  folic acid (FOLVITE) 1 MG  tablet Take 1 tablet (1 mg total) daily by mouth. 12/22/16  Yes Leana Gamer, MD  hydroxyurea (HYDREA) 500 MG capsule TAKE 3 CAPSULES (1,500 MG TOTAL) BY MOUTH DAILY. Hold taking this medication until discussed with Dr. Alyson Ingles as patient is not taking regularly and the dose which he is prescribed when taken regularly has demonstrated bone marrow supression 12/21/16  Yes Leana Gamer, MD  nortriptyline (PAMELOR) 25 MG capsule TAKE 1 CAPSULE (25 MG TOTAL) BY MOUTH AT BEDTIME. 01/18/17  Yes Donnamae Jude, MD  Oxycodone HCl 10 MG TABS Take 10 mg by mouth every 6 (six) hours as needed.   Yes [provider]  sodium bicarbonate 650 MG tablet Take 1 tablet (650 mg total) 3 (three) times daily by mouth. 12/21/16  Yes Leana Gamer, MD  Menthol-Methyl Salicylate (MUSCLE RUB) 10-15 % CREA Apply 1 application as needed topically for muscle pain. Patient not taking: Reported on 02/27/2017 12/21/16   Leana Gamer, MD     Allergies:     Allergies  Allergen Reactions  . Nsaids   . Morphine And Related Other (See Comments)    "real bad headaches"     Physical Exam:   Vitals  Blood pressure 104/72, pulse 98, temperature 97.9 F (36.6 C), temperature source Oral, resp. rate 16, height '5\' 4"'  (1.626 m), weight 54.4 kg (120 lb), SpO2 97 %.   1. General  lying in bed in NAD,    2. Normal affect and insight, Not Suicidal or Homicidal, Awake Alert, Oriented X 3.  3. No F.N deficits, ALL C.Nerves Intact, Strength 5/5 all 4 extremities, Sensation intact all 4 extremities, Plantars down going.  4. Ears and Eyes appear Normal, Conjunctivae clear, PERRLA. Moist Oral Mucosa.  5. Supple Neck, No JVD, No cervical lymphadenopathy appriciated, No Carotid Bruits.  6. Symmetrical Chest wall movement, Good air movement bilaterally, CTAB.  7. Borderline tachycardia s1, s2,  No Gallops, Rubs or Murmurs, No Parasternal Heave.  8. Positive Bowel Sounds, Abdomen Soft, No  tenderness, No organomegaly appriciated,No rebound -guarding or rigidity.  9.  No Cyanosis, Normal  Skin Turgor, No Skin Rash or Bruise.  10. Good muscle tone,  joints appear normal , no effusions, Normal ROM.  11. No Palpable Lymph Nodes in Neck or Axillae     Data Review:    CBC Recent Labs  Lab 02/27/17 1243  WBC 14.8*  HGB 7.8*  HCT 21.8*  PLT 126*  MCV 100.9*  MCH 36.1*  MCHC 35.8  RDW NOT CALCULATED  LYMPHSABS 4.4*  MONOABS 1.3*  EOSABS 0.1  BASOSABS 0.0   ------------------------------------------------------------------------------------------------------------------  Chemistries  Recent Labs  Lab 02/27/17 1243  NA 137  K 4.3  CL 111  CO2 23  GLUCOSE 99  BUN 11  CREATININE 1.65*  CALCIUM 7.7*  AST 89*  ALT 41  ALKPHOS 216*  BILITOT 3.4*   ------------------------------------------------------------------------------------------------------------------ estimated creatinine clearance is 48.5 mL/min (A) (by C-G formula based on SCr of 1.65 mg/dL (H)). ------------------------------------------------------------------------------------------------------------------ No results for input(s): TSH, T4TOTAL, T3FREE, THYROIDAB in the last 72 hours.  Invalid input(s): FREET3  Coagulation profile No results for input(s): INR, PROTIME in the last 168 hours. ------------------------------------------------------------------------------------------------------------------- No results for input(s): DDIMER in the last 72 hours. -------------------------------------------------------------------------------------------------------------------  Cardiac Enzymes Recent Labs  Lab 02/27/17 1310 02/27/17 1552  TROPONINI 0.06* 0.07*   ------------------------------------------------------------------------------------------------------------------ No results found for:  BNP   ---------------------------------------------------------------------------------------------------------------  Urinalysis    Component Value Date/Time   COLORURINE YELLOW 10/15/2016 [place] 10/15/2016 0454   LABSPEC 1.010 10/15/2016 0454   PHURINE 6.0 10/15/2016 0454   GLUCOSEU NEGATIVE 10/15/2016 0454   HGBUR MODERATE (A) 10/15/2016 0454   HGBUR small 03/10/2010 [place] 10/15/2016 0454   KETONESUR NEGATIVE 10/15/2016 0454   PROTEINUR 30 (A) 10/15/2016 0454   UROBILINOGEN 0.2 11/21/2014 2003   NITRITE NEGATIVE 10/15/2016 0454   LEUKOCYTESUR NEGATIVE 10/15/2016 0454    ----------------------------------------------------------------------------------------------------------------   Imaging Results:    Dg Chest 2 View  Result Date: 02/27/2017 CLINICAL DATA:  Cough and chest pain EXAM: CHEST  2 VIEW COMPARISON:  Chest radiograph 12/19/2016 FINDINGS: Previously seen central venous catheter is no longer present. The lungs are mildly scarred bilaterally, particularly in the left base, but there is no focal consolidation. No pulmonary edema, pleural effusion or pneumothorax. IMPRESSION: No active cardiopulmonary disease. Electronically Signed   By: Ulyses Jarred M.D.   On: 02/27/2017 13:35   Ct Angio Chest Pe W And/or Wo Contrast  Result Date: 02/27/2017 CLINICAL DATA:  Cough and pain EXAM: CT ANGIOGRAPHY CHEST WITH CONTRAST TECHNIQUE: Multidetector CT imaging of the chest was performed using the standard protocol during bolus administration of intravenous contrast. Multiplanar CT image reconstructions and MIPs were obtained to evaluate the vascular anatomy. CONTRAST:  1109m ISOVUE-370 IOPAMIDOL (ISOVUE-370) INJECTION 76% COMPARISON:  None. FINDINGS: Cardiovascular: There are no filling defects in the pulmonary arterial tree to suggest acute pulmonary thromboembolism. There is no evidence of aortic dissection or aneurysm. Great vessels are  grossly patent. Mediastinum/Nodes: No abnormal mediastinal adenopathy. No pericardial effusion. Lungs/Pleura: No pneumothorax. No pleural effusion. Lungs are under aerated. There are bilateral dependent heterogeneous opacities with the lungs, best characterized as ground-glass and streaky opacities. No dominant lung mass. There are cystic areas within the dependent lower lobes without obvious fibrotic change or honeycombing. Nonspecific patchy and ground-glass opacities in the right upper lobe on images 60 and 64 most consistent with an inflammatory process. Upper Abdomen: No acute abnormality. Musculoskeletal: There are endplate changes consistent with sickle cell disorder. Review of the MIP images confirms the above findings. IMPRESSION:  No evidence of acute pulmonary thromboembolism. There are bilateral dependent heterogeneous opacities in the lungs as well as patchy right upper lobe opacities. These findings are worrisome for an inflammatory process. Electronically Signed   By: Marybelle Killings M.D.   On: 02/27/2017 17:11      Assessment & Plan:    Principal Problem:   Pneumonia Active Problems:   CHRONIC KIDNEY DISEASE STAGE II (MILD)   Tachycardia   Anemia   Elevated troponin   Diarrhea    HCAP Blood culture x2 Sputum gram stain culture vanco iv ,zosyn iv pharmacy to dose  Diarrhea Stool studies  Tachycardia/ trop elevation Tele Trop I q6h x3 Echo Cardiology consult in am by email sent  CKD stage2/3 Hydrate with ns iv Check cmp in am  Sickle cell crisis Folic acid Dilaudid pca  Sickle cell anemia Check cbc in am  DVT Prophylaxis Lovenox - SCDs   AM Labs Ordered, also please review Full Orders  Family Communication: Admission, patients condition and plan of care including tests being ordered have been discussed with the patient who indicate understanding and agree with the plan and Code Status.  Code Status FULL CODE  Likely DC to  home  Condition GUARDED    Consults called: none  Admission status: inpatient  Time spent in minutes : 45   Jani Gravel M.D on 02/27/2017 at 6:57 PM  Between 7am to 7pm - Pager - [phone]. After 7pm go to [email or web address] - password [facility]  [facility] - Office  [phone]

## 2017-02-27 NOTE — ED Notes (Signed)
Bed: WLPT1 Expected date:  Expected time:  Means of arrival:  Comments: 

## 2017-02-27 NOTE — ED Notes (Signed)
Pharmacy notified that orders were released

## 2017-02-27 NOTE — ED Notes (Signed)
2nd [place]

## 2017-02-27 NOTE — ED Notes (Signed)
Pt has had no BM's since he arrived in ED.

## 2017-02-27 NOTE — ED Notes (Signed)
Pt's oxygen 92% on RA. Pt placed on 2L O2 via [place] per order. Pt's oxygen now 96% on 2L [place]

## 2017-02-27 NOTE — ED Notes (Signed)
Pt instructed multiple times to keep [place] in nose. Pt became irritated with nurse and told her to "stop exaggerating and don't tell [him] what to do"

## 2017-02-27 NOTE — ED Notes (Signed)
Bed: KC46 Expected date:  Expected time:  Means of arrival:  Comments: Hold for triage 4

## 2017-02-27 NOTE — Progress Notes (Signed)
Patient ID: [name] [name]., male   DOB: [birth year], 37 y.o.   MRN: [id number]  Received call from Arlean Hopping, [place] in the ED. Pt with cough and pain. Shawn concerned about positive Troponin. Shawn also reports that he was unsure if patients pain was controlled as he was refusing to speak with staff. Raquel Sarna will repeat Troponin at this time and he will check with patient about his readiness to be discharged and if he feels that an admission is warranted he will page again.  MATTHEWS,[name]

## 2017-02-27 NOTE — ED Notes (Signed)
Patient transported to X-ray 

## 2017-02-27 NOTE — ED Triage Notes (Signed)
Patient c/o sickle cell pain of the chest, lower back, bilateral LLE and bilateral feet. Patient also c/o SOB.

## 2017-02-28 ENCOUNTER — Encounter (HOSPITAL_COMMUNITY): Payer: Self-pay | Admitting: Physician Assistant

## 2017-02-28 ENCOUNTER — Inpatient Hospital Stay (HOSPITAL_COMMUNITY): Payer: Medicare Other

## 2017-02-28 DIAGNOSIS — F1123 Opioid dependence with withdrawal: Secondary | ICD-10-CM

## 2017-02-28 DIAGNOSIS — E872 Acidosis: Secondary | ICD-10-CM

## 2017-02-28 DIAGNOSIS — I361 Nonrheumatic tricuspid (valve) insufficiency: Secondary | ICD-10-CM

## 2017-02-28 DIAGNOSIS — D5700 Hb-SS disease with crisis, unspecified: Principal | ICD-10-CM

## 2017-02-28 DIAGNOSIS — R748 Abnormal levels of other serum enzymes: Secondary | ICD-10-CM

## 2017-02-28 DIAGNOSIS — D5701 Hb-SS disease with acute chest syndrome: Secondary | ICD-10-CM

## 2017-02-28 DIAGNOSIS — N183 Chronic kidney disease, stage 3 (moderate): Secondary | ICD-10-CM

## 2017-02-28 DIAGNOSIS — D638 Anemia in other chronic diseases classified elsewhere: Secondary | ICD-10-CM

## 2017-02-28 LAB — COMPREHENSIVE METABOLIC PANEL
ALT: 39 U/L (ref 17–63)
AST: 96 U/L — AB (ref 15–41)
Albumin: 1.5 g/dL — ABNORMAL LOW (ref 3.5–5.0)
Alkaline Phosphatase: 189 U/L — ABNORMAL HIGH (ref 38–126)
Anion gap: 4 — ABNORMAL LOW (ref 5–15)
BUN: 12 mg/dL (ref 6–20)
CHLORIDE: 109 mmol/L (ref 101–111)
CO2: 19 mmol/L — AB (ref 22–32)
Calcium: 7.5 mg/dL — ABNORMAL LOW (ref 8.9–10.3)
Creatinine, Ser: 1.86 mg/dL — ABNORMAL HIGH (ref 0.61–1.24)
GFR calc Af Amer: 53 mL/min — ABNORMAL LOW (ref >60)
GFR calc non Af Amer: 46 mL/min — ABNORMAL LOW (ref >60)
Glucose, Bld: 100 mg/dL — ABNORMAL HIGH (ref 65–99)
POTASSIUM: 4.2 mmol/L (ref 3.5–5.1)
SODIUM: 132 mmol/L — AB (ref 135–145)
Total Bilirubin: 2.3 mg/dL — ABNORMAL HIGH (ref 0.3–1.2)
Total Protein: 4.5 g/dL — ABNORMAL LOW (ref 6.5–8.1)

## 2017-02-28 LAB — CBC WITH DIFFERENTIAL/PLATELET
BASOS ABS: 0 10*3/uL (ref 0.0–0.1)
BASOS PCT: 0 %
EOS PCT: 2 %
Eosinophils Absolute: 0.4 10*3/uL (ref 0.0–0.7)
HCT: 18.4 % — ABNORMAL LOW (ref 39.0–52.0)
HEMOGLOBIN: 6.6 g/dL — AB (ref 13.0–17.0)
Lymphocytes Relative: 40 %
Lymphs Abs: 7.4 10*3/uL — ABNORMAL HIGH (ref 0.7–4.0)
MCH: 35.3 pg — ABNORMAL HIGH (ref 26.0–34.0)
MCHC: 35.9 g/dL (ref 30.0–36.0)
MCV: 98.4 fL (ref 78.0–100.0)
MONOS PCT: 14 %
Monocytes Absolute: 2.6 10*3/uL — ABNORMAL HIGH (ref 0.1–1.0)
NEUTROS ABS: 8.2 10*3/uL — AB (ref 1.7–7.7)
NEUTROS PCT: 44 %
Platelets: 120 10*3/uL — ABNORMAL LOW (ref 150–400)
RBC: 1.87 MIL/uL — AB (ref 4.22–5.81)
WBC: 18.6 10*3/uL — ABNORMAL HIGH (ref 4.0–10.5)
nRBC: 1 /100 WBC — ABNORMAL HIGH

## 2017-02-28 LAB — RESPIRATORY PANEL BY PCR
ADENOVIRUS-RVPPCR: NOT DETECTED
Bordetella pertussis: NOT DETECTED
CORONAVIRUS 229E-RVPPCR: NOT DETECTED
CORONAVIRUS HKU1-RVPPCR: NOT DETECTED
CORONAVIRUS NL63-RVPPCR: NOT DETECTED
CORONAVIRUS OC43-RVPPCR: NOT DETECTED
Chlamydophila pneumoniae: NOT DETECTED
Influenza A: NOT DETECTED
Influenza B: NOT DETECTED
Metapneumovirus: NOT DETECTED
Mycoplasma pneumoniae: NOT DETECTED
PARAINFLUENZA VIRUS 1-RVPPCR: NOT DETECTED
Parainfluenza Virus 2: NOT DETECTED
Parainfluenza Virus 3: NOT DETECTED
Parainfluenza Virus 4: NOT DETECTED
Respiratory Syncytial Virus: NOT DETECTED
Rhinovirus / Enterovirus: NOT DETECTED

## 2017-02-28 LAB — RETICULOCYTES: RBC.: 1.83 MIL/uL — ABNORMAL LOW (ref 4.22–5.81)

## 2017-02-28 LAB — STREP PNEUMONIAE URINARY ANTIGEN: Strep Pneumo Urinary Antigen: NEGATIVE

## 2017-02-28 LAB — ECHOCARDIOGRAM COMPLETE
Height: 64 in
WEIGHTICAEL: 1920 [oz_av]

## 2017-02-28 LAB — TROPONIN I
TROPONIN I: 0.07 ng/mL — AB (ref <0.03)
TROPONIN I: 0.06 ng/mL — AB (ref <0.03)
Troponin I: 0.06 ng/mL (ref <0.03)

## 2017-02-28 MED ORDER — HYDROMORPHONE 1 MG/ML IV SOLN
INTRAVENOUS | Status: DC
  Administered 2017-02-28: 1.6 mg via INTRAVENOUS
  Administered 2017-02-28: 0.83 mg via INTRAVENOUS
  Administered 2017-02-28: 1.2 mg via INTRAVENOUS
  Administered 2017-03-01: 0.4 mg via INTRAVENOUS
  Administered 2017-03-01: 0.8 mg via INTRAVENOUS
  Administered 2017-03-01: 20:00:00 via INTRAVENOUS
  Administered 2017-03-01: 2 mg via INTRAVENOUS
  Administered 2017-03-02: 1.6 mg via INTRAVENOUS
  Administered 2017-03-02: 1.2 mg via INTRAVENOUS
  Administered 2017-03-02 (×2): 2.4 mg via INTRAVENOUS
  Administered 2017-03-02: 0.4 mg via INTRAVENOUS
  Administered 2017-03-02: 4 mg via INTRAVENOUS
  Administered 2017-03-02: 2.4 mg via INTRAVENOUS
  Administered 2017-03-03: 1.6 mg via INTRAVENOUS
  Administered 2017-03-03: 2 mg via INTRAVENOUS
  Administered 2017-03-03: 23:00:00 via INTRAVENOUS
  Administered 2017-03-03: 2.4 mg via INTRAVENOUS
  Administered 2017-03-04: 6 mg via INTRAVENOUS
  Administered 2017-03-04: 4 mg via INTRAVENOUS
  Filled 2017-02-28 (×2): qty 25

## 2017-02-28 MED ORDER — SODIUM CHLORIDE 0.9 % IV SOLN: Freq: Once | INTRAVENOUS | Status: DC

## 2017-02-28 MED ORDER — OXYCODONE HCL 5 MG PO TABS
10.0000 mg | ORAL_TABLET | ORAL | Status: DC | PRN
  Administered 2017-02-28 – 2017-03-01 (×3): 10 mg via ORAL
  Filled 2017-02-28 (×3): qty 2

## 2017-02-28 NOTE — Progress Notes (Signed)
  Echocardiogram 2D Echocardiogram has been performed.  [name] [name] M 02/28/2017, 11:47 AM

## 2017-02-28 NOTE — Progress Notes (Signed)
PCA pump turned off,  Patient falling asleep during conversations and refusing to keep monitor on. Lamar Blinks, NP made aware. Orders placed for PRN pain medication.

## 2017-02-28 NOTE — ED Notes (Signed)
Attempted to get blood for labs x 2 but unsuccessful.

## 2017-02-28 NOTE — Progress Notes (Addendum)
Patient continuously takes CO2 monitor out of nose while on PCA. Pt educated multiple times on the importance of leaving it in for his safety. He states "I told you imma take it off if my nose is dry." He asked for ointment and humidity to go on his oxygen, which was given to him. Yet he still continues to be noncompliant and stated "I'm not about to sit here and do this with you, I don't care what you do but I'm going to take it out if my nose is dry." He falls asleep mid conversation. I offered him tape to place on the tubing in order to keep it more secure. He said "no because if I need it out I don't want it to be stuck." Raliegh Ip Schorr NP notified. Per Schorr, NP, PCA is to be turned off if patient cannot agree to keep the monitor on.
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
## 2017-02-28 NOTE — Progress Notes (Signed)
CRITICAL VALUE ALERT  Critical Value:  HEMOGLOBIN 6.6  Date & Time Notied: 02/28/17, 1003  Provider Notified: MATTHEWS  Orders Received/Actions taken:

## 2017-02-28 NOTE — Progress Notes (Signed)
Using Dinamap, Oxygen saturation reassess on room air at rest for 15 minutes. Saturation varied from 87%-94%. Patient was encouraged to deep breathe. Oxygen reapplied at 2 lt, will continue to monitor and update provider.

## 2017-02-28 NOTE — Consult Note (Signed)
Cardiology Consultation:   Patient ID: [name] [name].; [id number]; 1980-04-16   Admit date: 02/27/2017 Date of Consult: 02/28/2017  Primary Care Provider: [name], MD Primary Cardiologist: New to Dr. Johnsie Cancel  Chief Complaint: cough, malaise, fatigue, diarrhea  Patient Profile:   [name] [name]. is a 37 y.o. 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 Maudie Mercury.  History of Present Illness:   [name] [name] reports a remote history of murmur in childhood but does not recall what workup was done for this. 2D echo 03/2014 showed EF 90-24%, normal diastolic function, mild TR, normal PA prssure.  He presented to [facility] 1/15 with dry cough, generalized pain consistent with prior sickle cell crisis, and diarrhea for 3 days. Influenza panel negative, leukocytosis of 14.8, anemia of 7.8, thrombocytopenia with platelet count of 126, hypoalbuminemia of 1.6, abnormal LFTs with AP 216, AST 89. Resp panel pending. Strep pneumo neg. UDS + opiates/THC. CTA was negative for PE but did show bilateral dependent heterogeneous opacities in the lungs as well as patchy right upper lobe opacities, worrisome for an inflammatory process. We are consulted for troponins 0.06-0.07-0.06. The patient reports chronic dyspnea on exertion for the last year at least, possibly longer. Also has h/o shower intolerance due to feeling lightheaded. No syncope. Only has chest discomfort when coughing but in general no significant chest pain. Notes indicate he has been difficult to deal with at times, verbally aggressive and cursing. Social history includes mention of prior cocaine use including selling. He admits to previously trying this but no active use. He was polite during our interview.  Past Medical History:  Diagnosis  Date  . Bacterial pneumonia ~ 2012   "caught it here in the hospital" (09/25/2012)  . Chronic kidney disease    "from my sickle cell" (09/25/2012)  . CKD (chronic kidney disease), stage II   . Cyst of brain    "2 really small ones in the back of my head; inside; saw them w/MRI" (09/25/2012)  . GERD (gastroesophageal reflux disease)    "after I eat alot of spicey foods" (09/25/2012)  . Gynecomastia, male 07/10/2012  . History of blood transfusion    "always related to sickle cell crisis" (09/25/2012)  . Migraines    "take RX qd to prevent them" (09/25/2012)  . Sickle cell anemia (HCC)   . Sickle cell crisis ([place]) 09/25/2012  . Sickle cell nephropathy ([place]) 07/10/2012  . Sinus tachycardia   . Tachycardia with heart rate 121-140 beats per minute with ambulation 08/04/2016    Past Surgical History:  Procedure Laterality Date  . CHOLECYSTECTOMY  ~ 2012  . IR FLUORO GUIDE CV LINE RIGHT  12/17/2016  . IR REMOVAL TUN CV CATH W/O FL  12/21/2016  . IR US GUIDE VASC ACCESS RIGHT  12/17/2016     Inpatient Medications: Scheduled Meds: . docusate sodium  100 mg Oral Daily  . enoxaparin (LOVENOX) injection  40 mg Subcutaneous Q24H  . feeding supplement (PRO-STAT SUGAR FREE 64)  30 mL Oral BID  . folic acid  1 mg Oral Daily  . HYDROmorphone   Intravenous Q4H  . senna-docusate  1 tablet Oral BID  . sodium bicarbonate  650 mg Oral TID   Continuous Infusions: . ceFEPime (MAXIPIME) IV 1 g (02/28/17 0900)  . dextrose  5 % and 0.45% NaCl 150 mL/hr at 02/28/17 0002  . diphenhydrAMINE    . vancomycin     PRN Meds: diphenhydrAMINE **OR** diphenhydrAMINE, naloxone **AND** sodium chloride flush, oxyCODONE, polyethylene glycol, prochlorperazine  Home Meds: Prior to Admission medications   Medication Sig Start Date End Date Taking? Authorizing Provider  enalapril (VASOTEC) 5 MG tablet Take 5 mg by mouth daily. 12/09/16  Yes [provider]  folic acid (FOLVITE) 1 MG tablet Take 1 tablet (1 mg  total) daily by mouth. 12/22/16  Yes Leana Gamer, MD  hydroxyurea (HYDREA) 500 MG capsule TAKE 3 CAPSULES (1,500 MG TOTAL) BY MOUTH DAILY. Hold taking this medication until discussed with Dr. Alyson Ingles as patient is not taking regularly and the dose which he is prescribed when taken regularly has demonstrated bone marrow supression 12/21/16  Yes Leana Gamer, MD  nortriptyline (PAMELOR) 25 MG capsule TAKE 1 CAPSULE (25 MG TOTAL) BY MOUTH AT BEDTIME. 01/18/17  Yes Donnamae Jude, MD  Oxycodone HCl 10 MG TABS Take 10 mg by mouth every 6 (six) hours as needed.   Yes [provider]  sodium bicarbonate 650 MG tablet Take 1 tablet (650 mg total) 3 (three) times daily by mouth. 12/21/16  Yes Leana Gamer, MD  Menthol-Methyl Salicylate (MUSCLE RUB) 10-15 % CREA Apply 1 application as needed topically for muscle pain. Patient not taking: Reported on 02/27/2017 12/21/16   Leana Gamer, MD    Allergies:    Allergies  Allergen Reactions  . Nsaids   . Morphine And Related Other (See Comments)    "real bad headaches"    Social History:   Social History   Socioeconomic History  . Marital status: Single    Spouse name: Not on file  . Number of children: Not on file  . Years of education: Not on file  . Highest education level: Not on file  Social Needs  . Financial resource strain: Not on file  . Food insecurity - worry: Not on file  . Food insecurity - inability: Not on file  . Transportation needs - medical: Not on file  . Transportation needs - non-medical: Not on file  Occupational History  . Not on file  Tobacco Use  . Smoking status: Current Some Day Smoker    Types: Cigarettes  . Smokeless tobacco: Never Used  . Tobacco comment: 09/25/2012 "I don't buy cigarettes; bum one from friends q now and then"  Substance and Sexual Activity  . Alcohol use: Yes    Alcohol/week: 0.0 oz    Comment: occasionally  . Drug use: No    Comment: 09/25/2012 "weed was  my biggest problem; stopped that ~ 1 1/2 months ago; I don't do cocaine; can't say I never have though"  . Sexual activity: Yes  Other Topics Concern  . Not on file  Social History Narrative    Lives alone in [place].   Back at school, studying Public relations account executive. Unemployed.    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     Family History:   The patient's family history includes Breast cancer in his mother.  ROS:  Please see the history of present illness.  *All other ROS reviewed and negative.     Physical Exam/Data:   Vitals:   02/28/17 0400 02/28/17 0443 02/28/17 0752 02/28/17 8295  BP:  110/75  112/77  Pulse:  (!) 107  95  Resp: 15 18 18 18   Temp:  98.9 F (37.2 C)    TempSrc:  Oral    SpO2: 95% 97% 98% 98%  Weight:      Height:        Intake/Output Summary (Last 24 hours) at 02/28/2017 1036 Last data filed at 02/28/2017 0708 Gross per 24 hour  Intake 2266 ml  Output 1500 ml  Net 766 ml   Filed Weights   02/27/17 1046  Weight: 120 lb (54.4 kg)   Body mass index is 20.6 kg/m.  General: Well developed, well nourished AAM, in no acute distress. Multiple tattoos including face Head: Normocephalic, atraumatic, sclera non-icteric, no xanthomas, nares are without discharge.  Neck: Negative for carotid bruits. JVD not elevated. Lungs: Clear bilaterally to auscultation without wheezes, rales, or rhonchi. Breathing is unlabored. Heart: RRR with S1 S2. No murmurs, rubs, or gallops appreciated. Abdomen: Soft, non-tender, non-distended with normoactive bowel sounds. No hepatomegaly. No rebound/guarding. No obvious abdominal masses. Msk:  Strength and tone appear normal for age. Extremities: No clubbing or cyanosis. No edema.  Distal pedal pulses are 2+ and equal bilaterally. Neuro: Alert and oriented X 3. No facial asymmetry. No  focal deficit. Moves all extremities spontaneously. Psych:  Responds to questions appropriately with a normal affect.  EKG:  The EKG was personally reviewed and demonstrates sinus tach 123bpm, nonsepcific TW changes, no acute changes. QTc difficult to assess given baseline quality  Relevant CV Studies: Pending echo this admission.  Laboratory Data:  Chemistry Recent Labs  Lab 02/27/17 1243 02/28/17 0938  NA 137 132*  K 4.3 4.2  CL 111 109  CO2 23 19*  GLUCOSE 99 100*  BUN 11 12  CREATININE 1.65* 1.86*  CALCIUM 7.7* 7.5*  GFRNONAA 53* 46*  GFRAA >60 53*  ANIONGAP 3* 4*    Recent Labs  Lab 02/27/17 1243 02/28/17 0938  PROT 5.1* 4.5*  ALBUMIN 1.6* 1.5*  AST 89* 96*  ALT 41 39  ALKPHOS 216* 189*  BILITOT 3.4* 2.3*   Hematology Recent Labs  Lab 02/27/17 1243 02/28/17 0938  WBC 14.8* 18.6*  RBC 2.16*  2.16* 1.87*  HGB 7.8* 6.6*  HCT 21.8* 18.4*  MCV 100.9* 98.4  MCH 36.1* 35.3*  MCHC 35.8 35.9  RDW NOT CALCULATED NOT CALCULATED  PLT 126* 120*   Cardiac Enzymes Recent Labs  Lab 02/27/17 1310 02/27/17 1552 02/28/17 0045 02/28/17 0938  TROPONINI 0.06* 0.07* 0.06* 0.07*   No results for input(s): TROPIPOC in the last 168 hours.  BNPNo results for input(s): BNP, PROBNP in the last 168 hours.  DDimer No results for input(s): DDIMER in the last 168 hours.  Radiology/Studies:  Dg Chest 2 View  Result Date: 02/27/2017 CLINICAL DATA:  Cough and chest pain EXAM: CHEST  2 VIEW COMPARISON:  Chest radiograph 12/19/2016 FINDINGS: Previously seen central venous catheter is no longer present. The lungs are mildly scarred bilaterally, particularly in the left base, but there is no focal consolidation. No pulmonary edema, pleural effusion or pneumothorax. IMPRESSION: No active cardiopulmonary disease. Electronically Signed   By: Ulyses Jarred M.D.   On: 02/27/2017 13:35   Ct Angio Chest Pe W And/or Wo Contrast  Result Date: 02/27/2017 CLINICAL DATA:  Cough and pain  EXAM: CT ANGIOGRAPHY CHEST WITH CONTRAST TECHNIQUE: Multidetector CT imaging of the chest was performed using the standard protocol during bolus administration of intravenous contrast. Multiplanar CT image reconstructions and  MIPs were obtained to evaluate the vascular anatomy. CONTRAST:  145mL ISOVUE-370 IOPAMIDOL (ISOVUE-370) INJECTION 76% COMPARISON:  None. FINDINGS: Cardiovascular: There are no filling defects in the pulmonary arterial tree to suggest acute pulmonary thromboembolism. There is no evidence of aortic dissection or aneurysm. Great vessels are grossly patent. Mediastinum/Nodes: No abnormal mediastinal adenopathy. No pericardial effusion. Lungs/Pleura: No pneumothorax. No pleural effusion. Lungs are under aerated. There are bilateral dependent heterogeneous opacities with the lungs, best characterized as ground-glass and streaky opacities. No dominant lung mass. There are cystic areas within the dependent lower lobes without obvious fibrotic change or honeycombing. Nonspecific patchy and ground-glass opacities in the right upper lobe on images 60 and 64 most consistent with an inflammatory process. Upper Abdomen: No acute abnormality. Musculoskeletal: There are endplate changes consistent with sickle cell disorder. Review of the MIP images confirms the above findings. IMPRESSION: No evidence of acute pulmonary thromboembolism. There are bilateral dependent heterogeneous opacities in the lungs as well as patchy right upper lobe opacities. These findings are worrisome for an inflammatory process. Electronically Signed   By: Marybelle Killings M.D.   On: 02/27/2017 17:11    Assessment and Plan:   1. Sickle cell crisis with cough/diarrhea and CT chest concerning for PNA vs inflammatory process - per IM.  2. Elevated troponin - suspect demand ischemia, no specific chest pain symptoms to suggest angina. He does have chronic DOE for which we will await echo to assess PA pressures.   3. Chronic appearing  sinus tach - consider checking thyroid function if this has not previously been done recently. TSH was actually on higher side in Sept 2018. Suspect this is reactive to his underlying comorbidities (I.e. Sickle cell anemia, pain) rather than intrinsic cardiac issue. Low dose beta blocker could be considered if his DOE persists, Hgb remains stable, and PA pressure is normal.   For questions or updates, please contact [place] Please consult [email or web address] for contact info under Cardiology/STEMI.    Signed, Charlie Pitter, PA-C  02/28/2017 10:36 AM  Patient examined chart reviewed. Young black male with multiple tattoos Lungs clear soft 1/6 SEM No edema Atypical prolonged pain with minimum elevation troponin .06-.07 no evolution and no acute ECG changes This Is non cardiac pain. Marland Kitchen Previous echo 2016 reviewed and EF normal no pulmonary HTN and no effusion Having Liver US now and then will have cardiac echo if unchanged would Rx sickle crisis with pain meds, oxygen and hydrate With no further cardiac w/u indicated  Jenkins Rouge

## 2017-02-28 NOTE — Progress Notes (Signed)
Oxygen saturation assessed on 2 lt O2 using dinamap, saturation maintained 98%.

## 2017-02-28 NOTE — Progress Notes (Signed)
Spoke to ultrasound technician, abdominal ultrasound is complete

## 2017-02-28 NOTE — Progress Notes (Signed)
Patient called out to secretary stating "he wants a doctor NOW!" This RN heard him call out so I went in the room to re-explain to him why his PCA was turned off. He became extremely verbally aggressive cursing at this RN. I tried to politely explain that it is unsafe to have him on dilaudid PCA while not monitoring his respiratory status. He repeatedly interrupted as I was explaining to him and yelled "I cant help it if it falls off while I'm asleep." This RN has asked if he could please put it back on numerous times since he arrived to the floor. He continued to scratch his nose and state that it was "dry and I will take it off if I need to." Amber, another RN, came to the room to explain to the patient the reasoning behind it being turned off as well. As this RN was leaving the room he stated "I hate these white people!!"

## 2017-02-28 NOTE — ED Notes (Signed)
Lab at bedside. Attempting to get blood.

## 2017-03-01 DIAGNOSIS — R079 Chest pain, unspecified: Secondary | ICD-10-CM

## 2017-03-01 LAB — CBC WITH DIFFERENTIAL/PLATELET
BASOS PCT: 0 %
Basophils Absolute: 0 10*3/uL (ref 0.0–0.1)
EOS PCT: 2 %
Eosinophils Absolute: 0.4 10*3/uL (ref 0.0–0.7)
HEMATOCRIT: 18.8 % — AB (ref 39.0–52.0)
HEMOGLOBIN: 6.8 g/dL — AB (ref 13.0–17.0)
Lymphocytes Relative: 40 %
Lymphs Abs: 7.6 10*3/uL — ABNORMAL HIGH (ref 0.7–4.0)
MCH: 35.8 pg — AB (ref 26.0–34.0)
MCHC: 36.2 g/dL — AB (ref 30.0–36.0)
MCV: 98.9 fL (ref 78.0–100.0)
Monocytes Absolute: 2.1 10*3/uL — ABNORMAL HIGH (ref 0.1–1.0)
Monocytes Relative: 11 %
NEUTROS ABS: 8.8 10*3/uL — AB (ref 1.7–7.7)
NRBC: 1 /100{WBCs} — AB
Neutrophils Relative %: 47 %
Platelets: 132 10*3/uL — ABNORMAL LOW (ref 150–400)
RBC: 1.9 MIL/uL — ABNORMAL LOW (ref 4.22–5.81)
RDW: 29.7 % — ABNORMAL HIGH (ref 11.5–15.5)
WBC: 18.9 10*3/uL — ABNORMAL HIGH (ref 4.0–10.5)

## 2017-03-01 LAB — LIPID PANEL
Cholesterol: 201 mg/dL — ABNORMAL HIGH (ref 0–200)
HDL: 49 mg/dL (ref >40)
LDL CALC: 114 mg/dL — AB (ref 0–99)
TRIGLYCERIDES: 192 mg/dL — AB (ref <150)
Total CHOL/HDL Ratio: 4.1 RATIO
VLDL: 38 mg/dL (ref 0–40)

## 2017-03-01 LAB — RETICULOCYTES
RBC.: 1.9 MIL/uL — ABNORMAL LOW (ref 4.22–5.81)
Retic Ct Pct: >23 % — ABNORMAL HIGH (ref 0.4–3.1)

## 2017-03-01 LAB — HEPATITIS PANEL, ACUTE
HEP A IGM: NEGATIVE
HEP B S AG: NEGATIVE
Hep B C IgM: NEGATIVE

## 2017-03-01 NOTE — Progress Notes (Signed)
Progress Note  Patient Name: [name] [name]. Date of Encounter: 03/01/2017  Primary Cardiologist: No primary care provider on file.   Subjective   Lethargic no complaints   Inpatient Medications    Scheduled Meds: . docusate sodium  100 mg Oral Daily  . enoxaparin (LOVENOX) injection  40 mg Subcutaneous Q24H  . feeding supplement (PRO-STAT SUGAR FREE 64)  30 mL Oral BID  . folic acid  1 mg Oral Daily  . HYDROmorphone   Intravenous Q4H  . senna-docusate  1 tablet Oral BID  . sodium bicarbonate  650 mg Oral TID   Continuous Infusions: . sodium chloride 10 mL/hr at 02/28/17 1200  . dextrose 5 % and 0.45% NaCl 100 mL/hr at 03/01/17 0506  . diphenhydrAMINE     PRN Meds: diphenhydrAMINE **OR** diphenhydrAMINE, naloxone **AND** sodium chloride flush, oxyCODONE, polyethylene glycol, prochlorperazine   Vital Signs    Vitals:   03/01/17 0329 03/01/17 0434 03/01/17 0738 03/01/17 0900  BP:  113/74  109/65  Pulse:  92  97  Resp: 16 18 16 18   Temp:  98.2 F (36.8 C)  98.4 F (36.9 C)  TempSrc:  Oral  Oral  SpO2: 96% 99% 100% 99%  Weight:      Height:        Intake/Output Summary (Last 24 hours) at 03/01/2017 0928 Last data filed at 03/01/2017 0752 Gross per 24 hour  Intake 3191.67 ml  Output 2225 ml  Net 966.67 ml   Filed Weights   02/27/17 1046  Weight: 120 lb (54.4 kg)    Telemetry    NSR 03/01/2017  - Personally Reviewed  ECG    NSR normal no acute changes  - Personally Reviewed  Physical Exam   GEN: No acute distress.   Neck: No JVD Cardiac: RRR, no murmurs, rubs, or gallops.  Respiratory: Clear to auscultation bilaterally. GI: Soft, nontender, non-distended  MS: No edema; No deformity. Neuro:  Nonfocal  Psych: Normal affect   Labs    Chemistry Recent Labs  Lab 02/27/17 1243 02/28/17 0938  NA 137 132*  K 4.3 4.2  CL 111 109  CO2 23 19*  GLUCOSE 99 100*  BUN 11 12  CREATININE 1.65* 1.86*  CALCIUM 7.7* 7.5*  PROT 5.1* 4.5*    ALBUMIN 1.6* 1.5*  AST 89* 96*  ALT 41 39  ALKPHOS 216* 189*  BILITOT 3.4* 2.3*  GFRNONAA 53* 46*  GFRAA >60 53*  ANIONGAP 3* 4*     Hematology Recent Labs  Lab 02/27/17 1243 02/28/17 0938 03/01/17 0421  WBC 14.8* 18.6* 18.9*  RBC 2.16*  2.16* 1.87*  1.83* 1.90*  1.90*  HGB 7.8* 6.6* 6.8*  HCT 21.8* 18.4* 18.8*  MCV 100.9* 98.4 98.9  MCH 36.1* 35.3* 35.8*  MCHC 35.8 35.9 36.2*  RDW NOT CALCULATED NOT CALCULATED 29.7*  PLT 126* 120* 132*    Cardiac Enzymes Recent Labs  Lab 02/27/17 1552 02/28/17 0045 02/28/17 0938 02/28/17 1239  TROPONINI 0.07* 0.06* 0.07* 0.06*   No results for input(s): TROPIPOC in the last 168 hours.   BNPNo results for input(s): BNP, PROBNP in the last 168 hours.   DDimer No results for input(s): DDIMER in the last 168 hours.   Radiology    Dg Chest 2 View  Result Date: 02/27/2017 CLINICAL DATA:  Cough and chest pain EXAM: CHEST  2 VIEW COMPARISON:  Chest radiograph 12/19/2016 FINDINGS: Previously seen central venous catheter is no longer present. The lungs are mildly scarred  bilaterally, particularly in the left base, but there is no focal consolidation. No pulmonary edema, pleural effusion or pneumothorax. IMPRESSION: No active cardiopulmonary disease. Electronically Signed   By: Ulyses Jarred M.D.   On: 02/27/2017 13:35   Ct Angio Chest Pe W And/or Wo Contrast  Result Date: 02/27/2017 CLINICAL DATA:  Cough and pain EXAM: CT ANGIOGRAPHY CHEST WITH CONTRAST TECHNIQUE: Multidetector CT imaging of the chest was performed using the standard protocol during bolus administration of intravenous contrast. Multiplanar CT image reconstructions and MIPs were obtained to evaluate the vascular anatomy. CONTRAST:  119mL ISOVUE-370 IOPAMIDOL (ISOVUE-370) INJECTION 76% COMPARISON:  None. FINDINGS: Cardiovascular: There are no filling defects in the pulmonary arterial tree to suggest acute pulmonary thromboembolism. There is no evidence of aortic dissection  or aneurysm. Great vessels are grossly patent. Mediastinum/Nodes: No abnormal mediastinal adenopathy. No pericardial effusion. Lungs/Pleura: No pneumothorax. No pleural effusion. Lungs are under aerated. There are bilateral dependent heterogeneous opacities with the lungs, best characterized as ground-glass and streaky opacities. No dominant lung mass. There are cystic areas within the dependent lower lobes without obvious fibrotic change or honeycombing. Nonspecific patchy and ground-glass opacities in the right upper lobe on images 60 and 64 most consistent with an inflammatory process. Upper Abdomen: No acute abnormality. Musculoskeletal: There are endplate changes consistent with sickle cell disorder. Review of the MIP images confirms the above findings. IMPRESSION: No evidence of acute pulmonary thromboembolism. There are bilateral dependent heterogeneous opacities in the lungs as well as patchy right upper lobe opacities. These findings are worrisome for an inflammatory process. Electronically Signed   By: Marybelle Killings M.D.   On: 02/27/2017 17:11   US Abdomen Limited Ruq  Result Date: 02/28/2017 CLINICAL DATA:  Elevated liver function tests, prior cholecystectomy EXAM: ULTRASOUND ABDOMEN LIMITED RIGHT UPPER QUADRANT COMPARISON:  None. FINDINGS: Gallbladder: The gallbladder has previously been resected. Common bile duct: Diameter: The common bile duct is normal measuring 3.6 mm in diameter. Liver: The liver parenchyma is somewhat echogenic and inhomogeneous consistent with fatty infiltration. No focal hepatic abnormality is seen. Portal vein is patent on color Doppler imaging with normal direction of blood flow towards the liver. IMPRESSION: 1. Inhomogeneous and echogenic liver parenchyma consistent with diffuse fatty infiltration. No focal abnormality. 2. Prior cholecystectomy. Electronically Signed   By: Ivar Drape M.D.   On: 02/28/2017 11:05    Cardiac Studies   Echo normal EF 60-65% no pulmonary  HTN no pericardial effusion  Patient Profile     37 y.o. male with sickle cell disease. Atypica chest pain . Normal CXR no acute ECG changes no evolution in troponin's only .06 to .07   Assessment & Plan    1. Chest pain: non cardiac no need for further testing Echo with normal EF no RWMA;s No pericardial effusion and no pulmonary HTN  Will sign off   For questions or updates, please contact [place] Please consult [email or web address] for contact info under Cardiology/STEMI.      Signed, Jenkins Rouge, MD  03/01/2017, 9:28 AM

## 2017-03-01 NOTE — Progress Notes (Signed)
Patient called RN to room stating he was sick.  When RN entered the room patient had vomited moderate amount of green liquid on floor and bed.  Patient said it hit him all of a sudden.  Compazine given as prn order.  Assisted patient to change shirt and blankets.  Patient able to start eating breakfast after Compazine given.  Will continue to monitor.

## 2017-03-01 NOTE — Progress Notes (Signed)
Oxygen saturation on room air 96-97%, patient laying in bed with eyes closed,  refuses to perform incentive spirometry at this time.

## 2017-03-01 NOTE — Progress Notes (Signed)
SICKLE CELL SERVICE PROGRESS NOTE  [name] [name]. TDD:[id number] DOB: 01-Sep-1980 DOA: 02/27/2017 PCP: [name], MD  Assessment/Plan: Principal Problem:   Pneumonia Active Problems:   CHRONIC KIDNEY DISEASE STAGE II (MILD)   Tachycardia   Anemia   Sickle cell crisis (HCC)   Elevated troponin   Diarrhea  1. Acute Chest Syndrome: Clinically he is improved and saturations are 98% at rest on RA. Will hold on transfusion at this time.  2. Hypoxia: Secondary to Acute Chest Syndrome. Improved. 3. Hb SS with Vaso-occlusive Crisis: Continue PCA at current dose and continue Oxycodone on a PRN basis. He is unable to receive Toradol due to CKD.  4. Emesis: Diarrhea has resolved and he had only 1 episode of emesis since admission which was this morning just as he was about to eat breakfast. He has been able to eat and drink since then without difficulty. As noted before this is secondary to opiate withdrawal.  5. Elevated Troponin: Pt evaluated by Cardiology who advises that the elevation fo Troponin is non-cardiac.  6. Metabolic Acidosis: Compensated. He has been using baking soda in the absence of having the sodium bicarbonate pills.  7. Chronic CKD WCB:JSEG recent Cr from previous records from the Nephrologist.is 1.89 on 01/24/2017.  Will not prescribe Toradol. Pt has not been taking the Enalapril consistently and he has been out of the bicarbonate for about 1 week,  8. Psychosocial: Pt is in much better spirits today. He has been cooperative and appropriately conversant.  Code Status: Full Code Family Communication: N/A Disposition Plan: Not yet ready for discharge  [name] [name].  Pager [phone]. If 7PM-7AM, please contact night-coverage.  03/01/2017, 12:51 PM  LOS: 2 days   Interim History: Pt displays a much more pleasant disposition as compared to yesterday. He reports that he feels better than he did yesterday. He states that the pain is decreased but is unable to  rate the intensity by number. He states that he feels that she can start walking by tomorrow. He has used the ICS only minimally and I have encouraged him to increase use as much as possible.   Labs reviewed and noted that Respiratory panel negative and BC negative to date.   Consultants:  Cardiology  Procedures:  None  Antibiotics:  Vancomycin 1/15 >>1/16  Cefepime 1/15 >>1/16   Objective: Vitals:   03/01/17 0434 03/01/17 0738 03/01/17 0900 03/01/17 1246  BP: 113/74  109/65   Pulse: 92  97   Resp: 18 16 18 11   Temp: 98.2 F (36.8 C)  98.4 F (36.9 C)   TempSrc: Oral  Oral   SpO2: 99% 100% 99% 96%  Weight:      Height:       Weight change:   Intake/Output Summary (Last 24 hours) at 03/01/2017 1251 Last data filed at 03/01/2017 1145 Gross per 24 hour  Intake 2351.67 ml  Output 2675 ml  Net -323.33 ml      Physical Exam General: Alert, awake, oriented x3, in no apparent distress.  HEENT: [place]/AT PEERL, EOMI, anicteric Neck: Trachea midline,  no masses, no thyromegal,y no JVD, no carotid bruit OROPHARYNX:  Moist, No exudate/ erythema/lesions.  Heart: Regular rate and rhythm, without murmurs, rubs, gallops, PMI non-displaced, no heaves or thrills on palpation.  Lungs: Clear to auscultation with good air entry, no wheezing or rhonchi noted. No increased vocal fremitus resonant to percussion  Abdomen: Soft, nontender, nondistended, positive bowel sounds, no masses no hepatosplenomegaly noted.  Neuro: No  focal neurological deficits noted cranial nerves II through XII grossly intact. Strength at baseline in bilateral upper and lower extremities. Musculoskeletal: No warmth swelling or erythema around joints, no spinal tenderness noted. Psychiatric: Patient alert and oriented x3, good insight and cognition, good recent to remote recall.    Data Reviewed: Basic Metabolic Panel: Recent Labs  Lab 02/27/17 1243 02/28/17 0938  NA 137 132*  K 4.3 4.2  CL 111 109  CO2  23 19*  GLUCOSE 99 100*  BUN 11 12  CREATININE 1.65* 1.86*  CALCIUM 7.7* 7.5*   Liver Function Tests: Recent Labs  Lab 02/27/17 1243 02/28/17 0938  AST 89* 96*  ALT 41 39  ALKPHOS 216* 189*  BILITOT 3.4* 2.3*  PROT 5.1* 4.5*  ALBUMIN 1.6* 1.5*   No results for input(s): LIPASE, AMYLASE in the last 168 hours. No results for input(s): AMMONIA in the last 168 hours. CBC: Recent Labs  Lab 02/27/17 1243 02/28/17 0938 03/01/17 0421  WBC 14.8* 18.6* 18.9*  NEUTROABS 9.0* 8.2* 8.8*  HGB 7.8* 6.6* 6.8*  HCT 21.8* 18.4* 18.8*  MCV 100.9* 98.4 98.9  PLT 126* 120* 132*   Cardiac Enzymes: Recent Labs  Lab 02/27/17 1310 02/27/17 1552 02/28/17 0045 02/28/17 0938 02/28/17 1239  TROPONINI 0.06* 0.07* 0.06* 0.07* 0.06*   BNP (last 3 results) No results for input(s): BNP in the last 8760 hours.  ProBNP (last 3 results) No results for input(s): PROBNP in the last 8760 hours.  CBG: No results for input(s): GLUCAP in the last 168 hours.  Recent Results (from the past 240 hour(s))  Culture, blood (routine x 2)     Status: None (Preliminary result)   Collection Time: 02/27/17  9:40 PM  Result Value Ref Range Status   Specimen Description BLOOD RIGHT HAND  Final   Special Requests   Final    BOTTLES DRAWN AEROBIC ONLY Blood Culture adequate volume   Culture   Final    NO GROWTH 1 DAY Performed at [facility], [address], [place], [place] [address]    Report Status PENDING  Incomplete  Respiratory Panel by PCR     Status: None   Collection Time: 02/28/17 12:28 AM  Result Value Ref Range Status   Adenovirus NOT DETECTED NOT DETECTED Final   Coronavirus 229E NOT DETECTED NOT DETECTED Final   Coronavirus HKU1 NOT DETECTED NOT DETECTED Final   Coronavirus NL63 NOT DETECTED NOT DETECTED Final   Coronavirus OC43 NOT DETECTED NOT DETECTED Final   Metapneumovirus NOT DETECTED NOT DETECTED Final   Rhinovirus / Enterovirus NOT DETECTED NOT DETECTED Final   Influenza A  NOT DETECTED NOT DETECTED Final   Influenza B NOT DETECTED NOT DETECTED Final   Parainfluenza Virus 1 NOT DETECTED NOT DETECTED Final   Parainfluenza Virus 2 NOT DETECTED NOT DETECTED Final   Parainfluenza Virus 3 NOT DETECTED NOT DETECTED Final   Parainfluenza Virus 4 NOT DETECTED NOT DETECTED Final   Respiratory Syncytial Virus NOT DETECTED NOT DETECTED Final   Bordetella pertussis NOT DETECTED NOT DETECTED Final   Chlamydophila pneumoniae NOT DETECTED NOT DETECTED Final   Mycoplasma pneumoniae NOT DETECTED NOT DETECTED Final    Comment: Performed at [facility], [place] [address] [place], [place] [address]     Studies: Dg Chest 2 View  Result Date: 02/27/2017 CLINICAL DATA:  Cough and chest pain EXAM: CHEST  2 VIEW COMPARISON:  Chest radiograph 12/19/2016 FINDINGS: Previously seen central venous catheter is no longer  present. The lungs are mildly scarred bilaterally, particularly in the left base, but there is no focal consolidation. No pulmonary edema, pleural effusion or pneumothorax. IMPRESSION: No active cardiopulmonary disease. Electronically Signed   By: Ulyses Jarred M.D.   On: 02/27/2017 13:35   Ct Angio Chest Pe W And/or Wo Contrast  Result Date: 02/27/2017 CLINICAL DATA:  Cough and pain EXAM: CT ANGIOGRAPHY CHEST WITH CONTRAST TECHNIQUE: Multidetector CT imaging of the chest was performed using the standard protocol during bolus administration of intravenous contrast. Multiplanar CT image reconstructions and MIPs were obtained to evaluate the vascular anatomy. CONTRAST:  124mL ISOVUE-370 IOPAMIDOL (ISOVUE-370) INJECTION 76% COMPARISON:  None. FINDINGS: Cardiovascular: There are no filling defects in the pulmonary arterial tree to suggest acute pulmonary thromboembolism. There is no evidence of aortic dissection or aneurysm. Great vessels are grossly patent. Mediastinum/Nodes: No abnormal mediastinal adenopathy. No pericardial effusion. Lungs/Pleura: No pneumothorax. No pleural  effusion. Lungs are under aerated. There are bilateral dependent heterogeneous opacities with the lungs, best characterized as ground-glass and streaky opacities. No dominant lung mass. There are cystic areas within the dependent lower lobes without obvious fibrotic change or honeycombing. Nonspecific patchy and ground-glass opacities in the right upper lobe on images 60 and 64 most consistent with an inflammatory process. Upper Abdomen: No acute abnormality. Musculoskeletal: There are endplate changes consistent with sickle cell disorder. Review of the MIP images confirms the above findings. IMPRESSION: No evidence of acute pulmonary thromboembolism. There are bilateral dependent heterogeneous opacities in the lungs as well as patchy right upper lobe opacities. These findings are worrisome for an inflammatory process. Electronically Signed   By: Marybelle Killings M.D.   On: 02/27/2017 17:11   US Abdomen Limited Ruq  Result Date: 02/28/2017 CLINICAL DATA:  Elevated liver function tests, prior cholecystectomy EXAM: ULTRASOUND ABDOMEN LIMITED RIGHT UPPER QUADRANT COMPARISON:  None. FINDINGS: Gallbladder: The gallbladder has previously been resected. Common bile duct: Diameter: The common bile duct is normal measuring 3.6 mm in diameter. Liver: The liver parenchyma is somewhat echogenic and inhomogeneous consistent with fatty infiltration. No focal hepatic abnormality is seen. Portal vein is patent on color Doppler imaging with normal direction of blood flow towards the liver. IMPRESSION: 1. Inhomogeneous and echogenic liver parenchyma consistent with diffuse fatty infiltration. No focal abnormality. 2. Prior cholecystectomy. Electronically Signed   By: Ivar Drape M.D.   On: 02/28/2017 11:05    Scheduled Meds: . docusate sodium  100 mg Oral Daily  . enoxaparin (LOVENOX) injection  40 mg Subcutaneous Q24H  . feeding supplement (PRO-STAT SUGAR FREE 64)  30 mL Oral BID  . folic acid  1 mg Oral Daily  .  HYDROmorphone   Intravenous Q4H  . senna-docusate  1 tablet Oral BID  . sodium bicarbonate  650 mg Oral TID   Continuous Infusions: . sodium chloride 10 mL/hr at 02/28/17 1200  . dextrose 5 % and 0.45% NaCl 10 mL/hr at 03/01/17 1246  . diphenhydrAMINE      Principal Problem:   Pneumonia Active Problems:   CHRONIC KIDNEY DISEASE STAGE II (MILD)   Tachycardia   Anemia   Sickle cell crisis (HCC)   Elevated troponin   Diarrhea     In excess of 50 minutes spent during this visit. Greater than 50% involved face to face contact with the patient for assessment, counseling and coordination of care.

## 2017-03-01 NOTE — Plan of Care (Signed)
Patient stable during 7 a to 7p shift, states he is feeling better, pain down to a 7 in his legs, feet and back.  Using PCA minimally, received oral pain meds x 1 with improvement.  Vomited x 1 early in shift but able to eat all meals afterwards with no further nausea.

## 2017-03-01 NOTE — Progress Notes (Signed)
Patient has been off oxygen for approximately 30 minutes, O2 sat 98% on room air.

## 2017-03-02 DIAGNOSIS — R9431 Abnormal electrocardiogram [ECG] [EKG]: Secondary | ICD-10-CM

## 2017-03-02 DIAGNOSIS — K7689 Other specified diseases of liver: Secondary | ICD-10-CM

## 2017-03-02 DIAGNOSIS — R945 Abnormal results of liver function studies: Secondary | ICD-10-CM

## 2017-03-02 DIAGNOSIS — J189 Pneumonia, unspecified organism: Secondary | ICD-10-CM

## 2017-03-02 LAB — CBC WITH DIFFERENTIAL/PLATELET
BASOS ABS: 0 10*3/uL (ref 0.0–0.1)
BASOS PCT: 0 %
EOS PCT: 2 %
Eosinophils Absolute: 0.4 10*3/uL (ref 0.0–0.7)
HEMATOCRIT: 16.6 % — AB (ref 39.0–52.0)
Hemoglobin: 6 g/dL — CL (ref 13.0–17.0)
LYMPHS PCT: 34 %
Lymphs Abs: 6.7 10*3/uL — ABNORMAL HIGH (ref 0.7–4.0)
MCH: 35.7 pg — ABNORMAL HIGH (ref 26.0–34.0)
MCHC: 36.1 g/dL — ABNORMAL HIGH (ref 30.0–36.0)
MCV: 98.8 fL (ref 78.0–100.0)
Monocytes Absolute: 2.4 10*3/uL — ABNORMAL HIGH (ref 0.1–1.0)
Monocytes Relative: 12 %
NEUTROS PCT: 52 %
Neutro Abs: 10.3 10*3/uL — ABNORMAL HIGH (ref 1.7–7.7)
PLATELETS: 139 10*3/uL — AB (ref 150–400)
RBC: 1.68 MIL/uL — AB (ref 4.22–5.81)
WBC: 19.8 10*3/uL — ABNORMAL HIGH (ref 4.0–10.5)
nRBC: 2 /100 WBC — ABNORMAL HIGH

## 2017-03-02 LAB — BASIC METABOLIC PANEL
ANION GAP: 4 — AB (ref 5–15)
BUN: 13 mg/dL (ref 6–20)
CHLORIDE: 109 mmol/L (ref 101–111)
CO2: 19 mmol/L — AB (ref 22–32)
Calcium: 7.1 mg/dL — ABNORMAL LOW (ref 8.9–10.3)
Creatinine, Ser: 1.56 mg/dL — ABNORMAL HIGH (ref 0.61–1.24)
GFR calc non Af Amer: 56 mL/min — ABNORMAL LOW (ref >60)
Glucose, Bld: 126 mg/dL — ABNORMAL HIGH (ref 65–99)
POTASSIUM: 5.7 mmol/L — AB (ref 3.5–5.1)
Sodium: 132 mmol/L — ABNORMAL LOW (ref 135–145)

## 2017-03-02 LAB — RETICULOCYTES
RBC.: 1.68 MIL/uL — AB (ref 4.22–5.81)
Retic Ct Pct: >23 % — ABNORMAL HIGH (ref 0.4–3.1)

## 2017-03-02 LAB — LEGIONELLA PNEUMOPHILA SEROGP 1 UR AG: L. pneumophila Serogp 1 Ur Ag: NEGATIVE

## 2017-03-02 NOTE — Progress Notes (Signed)
Patient ID: [name] [name]., male   DOB: Jun 21, 1980, 37 y.o.   MRN: [id number] Subjective:  Patient is much improved, cooperative, has no new complaint. He denies any SOB, denies any chest pain. No cough. Pain is minimal.  Objective:  Vital signs in last 24 hours:  Vitals:   03/02/17 1300 03/02/17 1428 03/02/17 1430 03/02/17 1814  BP:  (!) 117/106 121/74   Pulse:  69    Resp: 16   16  Temp:  98.3 F (36.8 C)    TempSrc:  Oral    SpO2: 94% 92%  97%  Weight:      Height:       Intake/Output from previous day:   Intake/Output Summary (Last 24 hours) at 03/02/2017 1847 Last data filed at 03/02/2017 1800 Gross per 24 hour  Intake 280 ml  Output 2050 ml  Net -1770 ml    Physical Exam: General: Alert, awake, oriented x3, in no acute distress.  HEENT: [place]/AT [place]ER[place], EOMI Neck: Trachea midline,  no masses, no thyromegal,y no JVD, no carotid bruit OROPHARYNX:  Moist, No exudate/ erythema/lesions.  Heart: Regular rate and rhythm, without murmurs, rubs, gallops, PMI non-displaced, no heaves or thrills on palpation.  Lungs: Clear to auscultation, no wheezing or rhonchi noted. No increased vocal fremitus resonant to percussion  Abdomen: Soft, nontender, nondistended, positive bowel sounds, no masses no hepatosplenomegaly noted..  Neuro: No focal neurological deficits noted cranial nerves II through XII grossly intact. DTRs 2+ bilaterally upper and lower extremities. Strength 5 out of 5 in bilateral upper and lower extremities. Musculoskeletal: No warm swelling or erythema around joints, no spinal tenderness noted. Psychiatric: Patient alert and oriented x3, good insight and cognition, good recent to remote recall. Lymph node survey: No cervical axillary or inguinal lymphadenopathy noted.  Lab Results:  Basic Metabolic Panel:    Component Value Date/Time   NA 132 (L) 03/02/2017 0947   K 5.7 (H) 03/02/2017 0947   CL 109 03/02/2017 0947   CO2 19 (L) 03/02/2017 0947   BUN 13  03/02/2017 0947   CREATININE 1.56 (H) 03/02/2017 0947   CREATININE 1.45 (H) 05/12/2014 1430   GLUCOSE 126 (H) 03/02/2017 0947   CALCIUM 7.1 (L) 03/02/2017 0947   CBC:    Component Value Date/Time   WBC 19.8 (H) 03/02/2017 0947   HGB 6.0 (LL) 03/02/2017 0947   HCT 16.6 (L) 03/02/2017 0947   HCT 23.5 (L) 10/15/2016 0013   PLT 139 (L) 03/02/2017 0947   MCV 98.8 03/02/2017 0947   NEUTROABS 10.3 (H) 03/02/2017 0947   LYMPHSABS 6.7 (H) 03/02/2017 0947   MONOABS 2.4 (H) 03/02/2017 0947   EOSABS 0.4 03/02/2017 0947   BASOSABS 0.0 03/02/2017 0947    Recent Results (from the past 240 hour(s))  Culture, blood (routine x 2)     Status: None (Preliminary result)   Collection Time: 02/27/17  9:40 PM  Result Value Ref Range Status   Specimen Description BLOOD RIGHT HAND  Final   Special Requests   Final    BOTTLES DRAWN AEROBIC ONLY Blood Culture adequate volume   Culture   Final    NO GROWTH 2 DAYS Performed at [facility], [address], [place], [place] [address]    Report Status PENDING  Incomplete  Respiratory Panel by PCR     Status: None   Collection Time: 02/28/17 12:28 AM  Result Value Ref Range Status   Adenovirus NOT DETECTED NOT DETECTED Final   Coronavirus 229E  NOT DETECTED NOT DETECTED Final   Coronavirus HKU1 NOT DETECTED NOT DETECTED Final   Coronavirus NL63 NOT DETECTED NOT DETECTED Final   Coronavirus OC43 NOT DETECTED NOT DETECTED Final   Metapneumovirus NOT DETECTED NOT DETECTED Final   Rhinovirus / Enterovirus NOT DETECTED NOT DETECTED Final   Influenza A NOT DETECTED NOT DETECTED Final   Influenza B NOT DETECTED NOT DETECTED Final   Parainfluenza Virus 1 NOT DETECTED NOT DETECTED Final   Parainfluenza Virus 2 NOT DETECTED NOT DETECTED Final   Parainfluenza Virus 3 NOT DETECTED NOT DETECTED Final   Parainfluenza Virus 4 NOT DETECTED NOT DETECTED Final   Respiratory Syncytial Virus NOT DETECTED NOT DETECTED Final   Bordetella pertussis NOT DETECTED NOT  DETECTED Final   Chlamydophila pneumoniae NOT DETECTED NOT DETECTED Final   Mycoplasma pneumoniae NOT DETECTED NOT DETECTED Final    Comment: Performed at [facility], [address], [place], [place] [address]    Studies/Results: No results found.  Medications: Scheduled Meds: . docusate sodium  100 mg Oral Daily  . enoxaparin (LOVENOX) injection  40 mg Subcutaneous Q24H  . feeding supplement (PRO-STAT SUGAR FREE 64)  30 mL Oral BID  . folic acid  1 mg Oral Daily  . HYDROmorphone   Intravenous Q4H  . senna-docusate  1 tablet Oral BID  . sodium bicarbonate  650 mg Oral TID   Continuous Infusions: . sodium chloride 10 mL/hr at 02/28/17 1200  . dextrose 5 % and 0.45% NaCl 10 mL/hr at 03/01/17 1246  . diphenhydrAMINE     PRN Meds:.diphenhydrAMINE **OR** diphenhydrAMINE, naloxone **AND** sodium chloride flush, oxyCODONE, polyethylene glycol, prochlorperazine  Assessment/Plan: Principal Problem:   Pneumonia Active Problems:   CHRONIC KIDNEY DISEASE STAGE II (MILD)   Tachycardia   Anemia   Sickle cell crisis (HCC)   Elevated troponin   Diarrhea  1. Acute Chest Syndrome: Patient has improved significantly clinically, saturations are 98% at rest on RA.  2. Hypoxia: Secondary to Acute Chest Syndrome. Stable today 3. Hb SS with Vaso-occlusive Crisis: Continue PCA at current dose and continue Oxycodone on a PRN basis. He is unable to receive Toradol due to CKD.   4. Metabolic Acidosis: Compensated. He has been using baking soda in the absence of having the sodium bicarbonate pills.  5. Chronic CKD UEA:VWUJ recent Cr from previous records from the Nephrologist.is 1.89 on 01/24/2017. Pt has not been taking the Enalapril consistently and he has been out of the bicarbonate for about 1 week,  6. Psychosocial: Pt is in much better spirits today. He has cooperative during this encounter.  Code Status: Full Code Family Communication: N/A Disposition Plan: Not yet ready for  discharge  [name]  If 7PM-7AM, please contact night-coverage.  03/02/2017, 6:47 PM  LOS: 3 days

## 2017-03-02 NOTE — Care Management Important Message (Signed)
Important Message  Patient Details  Name: [name] [name]. MRN: [id number] Date of Birth: 1980/03/30   Medicare Important Message Given:  Yes    Kerin Salen 03/02/2017, 10:26 AMImportant Message  Patient Details  Name: [name] [name]. MRN: [id number] Date of Birth: 01/08/1981   Medicare Important Message Given:  Yes    Kerin Salen 03/02/2017, 10:26 AM

## 2017-03-03 LAB — CBC WITH DIFFERENTIAL/PLATELET
BASOS ABS: 0 10*3/uL (ref 0.0–0.1)
Basophils Relative: 0 %
EOS ABS: 0.6 10*3/uL (ref 0.0–0.7)
EOS PCT: 3 %
HCT: 16.1 % — ABNORMAL LOW (ref 39.0–52.0)
HEMOGLOBIN: 5.7 g/dL — AB (ref 13.0–17.0)
Lymphocytes Relative: 37 %
Lymphs Abs: 7.9 10*3/uL — ABNORMAL HIGH (ref 0.7–4.0)
MCH: 33.7 pg (ref 26.0–34.0)
MCHC: 35.4 g/dL (ref 30.0–36.0)
MCV: 95.3 fL (ref 78.0–100.0)
MONOS PCT: 9 %
Monocytes Absolute: 1.9 10*3/uL — ABNORMAL HIGH (ref 0.1–1.0)
NEUTROS ABS: 10.9 10*3/uL — AB (ref 1.7–7.7)
NEUTROS PCT: 51 %
Platelets: 131 10*3/uL — ABNORMAL LOW (ref 150–400)
RBC: 1.69 MIL/uL — ABNORMAL LOW (ref 4.22–5.81)
RDW: 30.9 % — ABNORMAL HIGH (ref 11.5–15.5)
WBC: 21.3 10*3/uL — ABNORMAL HIGH (ref 4.0–10.5)

## 2017-03-03 LAB — COMPREHENSIVE METABOLIC PANEL
ALBUMIN: 1.6 g/dL — AB (ref 3.5–5.0)
ALK PHOS: 194 U/L — AB (ref 38–126)
ALT: 33 U/L (ref 17–63)
AST: 87 U/L — AB (ref 15–41)
Anion gap: 5 (ref 5–15)
BILIRUBIN TOTAL: 1.9 mg/dL — AB (ref 0.3–1.2)
BUN: 12 mg/dL (ref 6–20)
CO2: 18 mmol/L — ABNORMAL LOW (ref 22–32)
CREATININE: 1.58 mg/dL — AB (ref 0.61–1.24)
Calcium: 7.7 mg/dL — ABNORMAL LOW (ref 8.9–10.3)
Chloride: 112 mmol/L — ABNORMAL HIGH (ref 101–111)
GFR calc Af Amer: >60 mL/min (ref >60)
GFR, EST NON AFRICAN AMERICAN: 56 mL/min — AB (ref >60)
GLUCOSE: 132 mg/dL — AB (ref 65–99)
Potassium: 4.4 mmol/L (ref 3.5–5.1)
Sodium: 135 mmol/L (ref 135–145)
TOTAL PROTEIN: 4.9 g/dL — AB (ref 6.5–8.1)

## 2017-03-03 LAB — PREPARE RBC (CROSSMATCH)

## 2017-03-03 MED ORDER — HYDROMORPHONE HCL 1 MG/ML IJ SOLN
2.0000 mg | Freq: Once | INTRAMUSCULAR | Status: AC
  Administered 2017-03-03: 2 mg via INTRAVENOUS
  Filled 2017-03-03: qty 2

## 2017-03-03 MED ORDER — SODIUM CHLORIDE 0.9 % IV SOLN: Freq: Once | INTRAVENOUS | Status: DC

## 2017-03-03 NOTE — Progress Notes (Addendum)
IV team was consulted to assist in secondary access. 2 RNs arrived pt was stuck once each, RNs unable to access. Will use PCA line to administer PRBC and hold PCA until PRBC's have finished. Will contact Phlebotomist to collect T&S

## 2017-03-03 NOTE — Progress Notes (Signed)
Report received by ongoing nurse. Agreed with RN assessment of patient and will cont to monitor.  

## 2017-03-03 NOTE — Progress Notes (Signed)
pca restarted

## 2017-03-03 NOTE — Progress Notes (Signed)
Patient has routinely been a difficult IV start/stick. Will consult IV team to assist due to PCA in primary site. Phlebotomy had a difficult time obtaining labs this am. Will request IV team to draw T&S for transfusion.

## 2017-03-04 LAB — CBC WITH DIFFERENTIAL/PLATELET
Basophils Absolute: 0 10*3/uL (ref 0.0–0.1)
Basophils Relative: 0 %
EOS ABS: 0.4 10*3/uL (ref 0.0–0.7)
Eosinophils Relative: 2 %
HCT: 25.2 % — ABNORMAL LOW (ref 39.0–52.0)
Hemoglobin: 8.8 g/dL — ABNORMAL LOW (ref 13.0–17.0)
LYMPHS PCT: 41 %
Lymphs Abs: 7.5 10*3/uL — ABNORMAL HIGH (ref 0.7–4.0)
MCH: 33.3 pg (ref 26.0–34.0)
MCHC: 34.9 g/dL (ref 30.0–36.0)
MCV: 95.5 fL (ref 78.0–100.0)
MONO ABS: 2 10*3/uL — AB (ref 0.1–1.0)
MONOS PCT: 11 %
NRBC: 2 /100{WBCs} — AB
Neutro Abs: 8.3 10*3/uL — ABNORMAL HIGH (ref 1.7–7.7)
Neutrophils Relative %: 46 %
PLATELETS: 179 10*3/uL (ref 150–400)
RBC: 2.64 MIL/uL — AB (ref 4.22–5.81)
RDW: 28.7 % — AB (ref 11.5–15.5)
WBC: 18.2 10*3/uL — ABNORMAL HIGH (ref 4.0–10.5)

## 2017-03-04 LAB — TYPE AND SCREEN
ABO/RH(D): O POS
Antibody Screen: NEGATIVE
UNIT DIVISION: 0

## 2017-03-04 LAB — BPAM RBC
Blood Product Expiration Date: 201902162359
ISSUE DATE / TIME: 201901191620
Unit Type and Rh: 5100

## 2017-03-04 LAB — BASIC METABOLIC PANEL
ANION GAP: 4 — AB (ref 5–15)
BUN: 12 mg/dL (ref 6–20)
CALCIUM: 7.9 mg/dL — AB (ref 8.9–10.3)
CO2: 22 mmol/L (ref 22–32)
CREATININE: 1.58 mg/dL — AB (ref 0.61–1.24)
Chloride: 110 mmol/L (ref 101–111)
GFR, EST NON AFRICAN AMERICAN: 56 mL/min — AB (ref >60)
Glucose, Bld: 100 mg/dL — ABNORMAL HIGH (ref 65–99)
Potassium: 4.2 mmol/L (ref 3.5–5.1)
SODIUM: 136 mmol/L (ref 135–145)

## 2017-03-04 MED ORDER — OXYCODONE HCL 10 MG PO TABS: 10.0000 mg | ORAL_TABLET | Freq: Four times a day (QID) | ORAL | 0 refills | Status: DC | PRN

## 2017-03-04 NOTE — Discharge Summary (Signed)
Physician Discharge Summary  [name] [name]. KGU:[id number] DOB: Aug 14, 1980 DOA: 02/27/2017  PCP: [name], MD  Admit date: 02/27/2017  Discharge date: 03/04/2017  Discharge Diagnoses:  Principal Problem:   Pneumonia Active Problems:   CHRONIC KIDNEY DISEASE STAGE II (MILD)   Tachycardia   Sickle cell pain crisis (HCC)   Anemia   Sickle cell crisis (HCC)   Elevated troponin   Diarrhea   Abnormal liver function   HCAP (healthcare-associated pneumonia)   QT prolongation  Discharge Condition: Stable  Disposition:  Follow-up Information    [name], MD. Schedule an appointment as soon as possible for a visit in 2 day(s).   Specialty:  Family Medicine Contact information: [address] [place] [address] [phone]          Pt is discharged home in good condition and is to follow up with [name], MD this week to have labs evaluated. He is instructed to increase activity slowly and balance with rest for the next few days, and use prescribed medication to complete treatment of pain  Diet: Regular  Wt Readings from Last 3 Encounters:  02/27/17 54.4 kg (120 lb)  12/15/16 53.4 kg (117 lb 11.2 oz)  10/16/16 61.5 kg (135 lb 9.6 oz)   History of present illness:  [name] [name]  is a 37 y.o. male, w sickle cell anemia, sickle cell nephropathy, ckd stage 2/3, apparently presents w c/o dry cough for 1 day, and diarrhea for the past 3 days. Pt denies fever, chills, cp, palp, sob, n/v, abd pain, brbpr.  Pt c/o generalized pain c/w his sickle cell crisis and therefore came today.   In ED: CTA chest IMPRESSION: No evidence of acute pulmonary thromboembolism.  There are bilateral dependent heterogeneous opacities in the lungs as well as patchy right upper lobe opacities. These findings are worrisome for an inflammatory process.  Hospital Course:  1. Acute Chest Syndrome: Pt presented with cough, Hypoxia and inflammatory findings on  CTA most compatible with Acute Chest Syndrome, not convinced that Pneumonia is present. Antibiotics was discontinued. One day before discharge, Hb dropped to below 6, he was transfused with one unit of PRBC. He improved clinically and physically, he was ambulating well without restriction. He was saturating well on room air. No SOB. May require blood transfusion.  2. Hypoxia: Secondary to Acute Chest Syndrome. Improved significantly after transfusion of 1 unit PRBC's.  3. Hb SS with Vaso-occlusive Crisis: Managed with sickle cell pain crisis management protocol. He improved and was transitioned to his oral Oxycodone on a PRN basis. He was unable to receive Toradol due to CKD.  4. Elevated Troponin: This is likely related to increased myocardial demand, evaluated by Cardiologist today, ECHO from showed normal EF and no sequela of pulmonary HTN.  5. Metabolic Acidosis: Compensated. He has been using baking soda in the absence of having the sodium bicarbonate pills. Resolved before discharge 6. Vomiting and Diarrhea: Resolved  7. Chronic CKD III: Most recent Cr from previous records from the Nephrologist.is 1.89 on 01/24/2017. Remained stable on admission, Serum Cr at discharge was 1.58.  8. Psychosocial: Pt has been very argumentative with staff. He is very demanding of what he believes to be an appropriate treatment plan and always very angry when we do not comply.   Patient improved significantly in his general conditions, was hemodynamically stable and was discharged in the same stable condition. Hb was 8.8 at the time of discharge. He will follow up with PCP  in 2 days. Prescription for 15 (fifteen) tablets of Oxycodone 10 mg each was prescribed for pain prn until patient follows up with PCP in 2 - 3 days.   Discharge Exam: Vitals:   03/04/17 0500 03/04/17 1008  BP: 122/68   Pulse: 89   Resp: 16 20  Temp: 98.3 F (36.8 C)   SpO2: 94% 97%   Vitals:   03/04/17 0209 03/04/17 0437 03/04/17 0500  03/04/17 1008  BP: 118/69  122/68   Pulse: 99  89   Resp: 16 16 16 20   Temp: 98.1 F (36.7 C)  98.3 F (36.8 C)   TempSrc: Oral  Oral   SpO2: 96% 93% 94% 97%  Weight:      Height:        General appearance : Awake, alert, not in any distress. Speech Clear. Not toxic looking HEENT: Atraumatic and Normocephalic, pupils equally reactive to light and accomodation Neck: Supple, no JVD. No cervical lymphadenopathy.  Chest: Good air entry bilaterally, no added sounds  CVS: S1 S2 regular, no murmurs.  Abdomen: Bowel sounds present, Non tender and not distended with no gaurding, rigidity or rebound. Extremities: B/L Lower Ext shows no edema, both legs are warm to touch Neurology: Awake alert, and oriented X 3, CN II-XII intact, Non focal Skin: No Rash  Discharge Instructions   Allergies as of 03/04/2017      Reactions   Nsaids    Morphine And Related Other (See Comments)   "real bad headaches"      Medication List    TAKE these medications   enalapril 5 MG tablet Commonly known as:  VASOTEC Take 5 mg by mouth daily.   folic acid 1 MG tablet Commonly known as:  FOLVITE Take 1 tablet (1 mg total) daily by mouth.   hydroxyurea 500 MG capsule Commonly known as:  HYDREA TAKE 3 CAPSULES (1,500 MG TOTAL) BY MOUTH DAILY. Hold taking this medication until discussed with Dr. Alyson Ingles as patient is not taking regularly and the dose which he is prescribed when taken regularly has demonstrated bone marrow supression   MUSCLE RUB 10-15 % Crea Apply 1 application as needed topically for muscle pain.   nortriptyline 25 MG capsule Commonly known as:  PAMELOR TAKE 1 CAPSULE (25 MG TOTAL) BY MOUTH AT BEDTIME.   Oxycodone HCl 10 MG Tabs Take 1 tablet (10 mg total) by mouth every 6 (six) hours as needed.   sodium bicarbonate 650 MG tablet Take 1 tablet (650 mg total) 3 (three) times daily by mouth.       The results of significant diagnostics from this hospitalization (including  imaging, microbiology, ancillary and laboratory) are listed below for reference.    Significant Diagnostic Studies: Dg Chest 2 View  Result Date: 02/27/2017 CLINICAL DATA:  Cough and chest pain EXAM: CHEST  2 VIEW COMPARISON:  Chest radiograph 12/19/2016 FINDINGS: Previously seen central venous catheter is no longer present. The lungs are mildly scarred bilaterally, particularly in the left base, but there is no focal consolidation. No pulmonary edema, pleural effusion or pneumothorax. IMPRESSION: No active cardiopulmonary disease. Electronically Signed   By: Ulyses Jarred M.D.   On: 02/27/2017 13:35   Ct Angio Chest Pe W And/or Wo Contrast  Result Date: 02/27/2017 CLINICAL DATA:  Cough and pain EXAM: CT ANGIOGRAPHY CHEST WITH CONTRAST TECHNIQUE: Multidetector CT imaging of the chest was performed using the standard protocol during bolus administration of intravenous contrast. Multiplanar CT image reconstructions and MIPs were obtained to  evaluate the vascular anatomy. CONTRAST:  154mL ISOVUE-370 IOPAMIDOL (ISOVUE-370) INJECTION 76% COMPARISON:  None. FINDINGS: Cardiovascular: There are no filling defects in the pulmonary arterial tree to suggest acute pulmonary thromboembolism. There is no evidence of aortic dissection or aneurysm. Great vessels are grossly patent. Mediastinum/Nodes: No abnormal mediastinal adenopathy. No pericardial effusion. Lungs/Pleura: No pneumothorax. No pleural effusion. Lungs are under aerated. There are bilateral dependent heterogeneous opacities with the lungs, best characterized as ground-glass and streaky opacities. No dominant lung mass. There are cystic areas within the dependent lower lobes without obvious fibrotic change or honeycombing. Nonspecific patchy and ground-glass opacities in the right upper lobe on images 60 and 64 most consistent with an inflammatory process. Upper Abdomen: No acute abnormality. Musculoskeletal: There are endplate changes consistent with sickle  cell disorder. Review of the MIP images confirms the above findings. IMPRESSION: No evidence of acute pulmonary thromboembolism. There are bilateral dependent heterogeneous opacities in the lungs as well as patchy right upper lobe opacities. These findings are worrisome for an inflammatory process. Electronically Signed   By: Marybelle Killings M.D.   On: 02/27/2017 17:11   US Abdomen Limited Ruq  Result Date: 02/28/2017 CLINICAL DATA:  Elevated liver function tests, prior cholecystectomy EXAM: ULTRASOUND ABDOMEN LIMITED RIGHT UPPER QUADRANT COMPARISON:  None. FINDINGS: Gallbladder: The gallbladder has previously been resected. Common bile duct: Diameter: The common bile duct is normal measuring 3.6 mm in diameter. Liver: The liver parenchyma is somewhat echogenic and inhomogeneous consistent with fatty infiltration. No focal hepatic abnormality is seen. Portal vein is patent on color Doppler imaging with normal direction of blood flow towards the liver. IMPRESSION: 1. Inhomogeneous and echogenic liver parenchyma consistent with diffuse fatty infiltration. No focal abnormality. 2. Prior cholecystectomy. Electronically Signed   By: Ivar Drape M.D.   On: 02/28/2017 11:05    Microbiology: Recent Results (from the past 240 hour(s))  Culture, blood (routine x 2)     Status: None (Preliminary result)   Collection Time: 02/27/17  9:40 PM  Result Value Ref Range Status   Specimen Description BLOOD RIGHT HAND  Final   Special Requests   Final    BOTTLES DRAWN AEROBIC ONLY Blood Culture adequate volume   Culture   Final    NO GROWTH 3 DAYS Performed at [facility], [address], [place], [place] [address]    Report Status PENDING  Incomplete  Respiratory Panel by PCR     Status: None   Collection Time: 02/28/17 12:28 AM  Result Value Ref Range Status   Adenovirus NOT DETECTED NOT DETECTED Final   Coronavirus 229E NOT DETECTED NOT DETECTED Final   Coronavirus HKU1 NOT DETECTED NOT DETECTED Final    Coronavirus NL63 NOT DETECTED NOT DETECTED Final   Coronavirus OC43 NOT DETECTED NOT DETECTED Final   Metapneumovirus NOT DETECTED NOT DETECTED Final   Rhinovirus / Enterovirus NOT DETECTED NOT DETECTED Final   Influenza A NOT DETECTED NOT DETECTED Final   Influenza B NOT DETECTED NOT DETECTED Final   Parainfluenza Virus 1 NOT DETECTED NOT DETECTED Final   Parainfluenza Virus 2 NOT DETECTED NOT DETECTED Final   Parainfluenza Virus 3 NOT DETECTED NOT DETECTED Final   Parainfluenza Virus 4 NOT DETECTED NOT DETECTED Final   Respiratory Syncytial Virus NOT DETECTED NOT DETECTED Final   Bordetella pertussis NOT DETECTED NOT DETECTED Final   Chlamydophila pneumoniae NOT DETECTED NOT DETECTED Final   Mycoplasma pneumoniae NOT DETECTED NOT DETECTED Final    Comment: Performed at [facility], [place] [address] [place], [place] [address]     Labs: Basic Metabolic Panel: Recent Labs  Lab 02/27/17 1243 02/28/17 0938 03/02/17 0947 03/03/17 1042 03/04/17 0350  NA 137 132* 132* 135 136  K 4.3 4.2 5.7* 4.4 4.2  CL 111 109 109 112* 110  CO2 23 19* 19* 18* 22  GLUCOSE 99 100* 126* 132* 100*  BUN 11 12 13 12 12   CREATININE 1.65* 1.86* 1.56* 1.58* 1.58*  CALCIUM 7.7* 7.5* 7.1* 7.7* 7.9*   Liver Function Tests: Recent Labs  Lab 02/27/17 1243 02/28/17 0938 03/03/17 1042  AST 89* 96* 87*  ALT 41 39 33  ALKPHOS 216* 189* 194*  BILITOT 3.4* 2.3* 1.9*  PROT 5.1* 4.5* 4.9*  ALBUMIN 1.6* 1.5* 1.6*   No results for input(s): LIPASE, AMYLASE in the last 168 hours. No results for input(s): AMMONIA in the last 168 hours. CBC: Recent Labs  Lab 02/28/17 0938 03/01/17 0421 03/02/17 0947 03/03/17 1042 03/04/17 0350  WBC 18.6* 18.9* 19.8* 21.3* 18.2*  NEUTROABS 8.2* 8.8* 10.3* 10.9* 8.3*  HGB 6.6* 6.8* 6.0* 5.7* 8.8*  HCT 18.4* 18.8* 16.6* 16.1* 25.2*  MCV 98.4 98.9 98.8 95.3 95.5  PLT 120* 132* 139* 131* 179   Cardiac Enzymes: Recent Labs  Lab 02/27/17 1310 02/27/17 1552  02/28/17 0045 02/28/17 0938 02/28/17 1239  TROPONINI 0.06* 0.07* 0.06* 0.07* 0.06*   BNP: Invalid input(s): POCBNP CBG: No results for input(s): GLUCAP in the last 168 hours.  Time coordinating discharge: 50 minutes  Signed:  [facility] 03/04/2017, 10:55 AM

## 2017-03-05 LAB — CULTURE, BLOOD (ROUTINE X 2)
CULTURE: NO GROWTH
Special Requests: ADEQUATE

## 2017-04-07 ENCOUNTER — Emergency Department (HOSPITAL_COMMUNITY): Payer: Medicare Other

## 2017-04-07 ENCOUNTER — Encounter (HOSPITAL_COMMUNITY): Payer: Self-pay | Admitting: Emergency Medicine

## 2017-04-07 ENCOUNTER — Inpatient Hospital Stay (HOSPITAL_COMMUNITY)
Admission: EM | Admit: 2017-04-07 | Discharge: 2017-04-09 | DRG: 392 | Disposition: A | Discharge status: Home / Self Care | Payer: Medicare Other | Attending: Internal Medicine | Admitting: Internal Medicine

## 2017-04-07 DIAGNOSIS — R0682 Tachypnea, not elsewhere classified: Secondary | ICD-10-CM | POA: Diagnosis present

## 2017-04-07 DIAGNOSIS — K219 Gastro-esophageal reflux disease without esophagitis: Secondary | ICD-10-CM | POA: Diagnosis present

## 2017-04-07 DIAGNOSIS — D5700 Hb-SS disease with crisis, unspecified: Secondary | ICD-10-CM | POA: Diagnosis not present

## 2017-04-07 DIAGNOSIS — Q782 Osteopetrosis: Secondary | ICD-10-CM

## 2017-04-07 DIAGNOSIS — N182 Chronic kidney disease, stage 2 (mild): Secondary | ICD-10-CM | POA: Diagnosis not present

## 2017-04-07 DIAGNOSIS — F17210 Nicotine dependence, cigarettes, uncomplicated: Secondary | ICD-10-CM | POA: Diagnosis present

## 2017-04-07 DIAGNOSIS — Z886 Allergy status to analgesic agent status: Secondary | ICD-10-CM

## 2017-04-07 DIAGNOSIS — N183 Chronic kidney disease, stage 3 (moderate): Secondary | ICD-10-CM | POA: Diagnosis not present

## 2017-04-07 DIAGNOSIS — G930 Cerebral cysts: Secondary | ICD-10-CM | POA: Diagnosis present

## 2017-04-07 DIAGNOSIS — D72829 Elevated white blood cell count, unspecified: Secondary | ICD-10-CM

## 2017-04-07 DIAGNOSIS — R748 Abnormal levels of other serum enzymes: Secondary | ICD-10-CM | POA: Diagnosis present

## 2017-04-07 DIAGNOSIS — Z79899 Other long term (current) drug therapy: Secondary | ICD-10-CM | POA: Diagnosis not present

## 2017-04-07 DIAGNOSIS — D571 Sickle-cell disease without crisis: Secondary | ICD-10-CM | POA: Diagnosis present

## 2017-04-07 DIAGNOSIS — R0902 Hypoxemia: Secondary | ICD-10-CM | POA: Diagnosis present

## 2017-04-07 DIAGNOSIS — K529 Noninfective gastroenteritis and colitis, unspecified: Principal | ICD-10-CM

## 2017-04-07 DIAGNOSIS — N189 Chronic kidney disease, unspecified: Secondary | ICD-10-CM

## 2017-04-07 DIAGNOSIS — N184 Chronic kidney disease, stage 4 (severe): Secondary | ICD-10-CM | POA: Diagnosis present

## 2017-04-07 DIAGNOSIS — Z9049 Acquired absence of other specified parts of digestive tract: Secondary | ICD-10-CM | POA: Diagnosis not present

## 2017-04-07 DIAGNOSIS — N62 Hypertrophy of breast: Secondary | ICD-10-CM | POA: Diagnosis present

## 2017-04-07 DIAGNOSIS — Z885 Allergy status to narcotic agent status: Secondary | ICD-10-CM

## 2017-04-07 DIAGNOSIS — N08 Glomerular disorders in diseases classified elsewhere: Secondary | ICD-10-CM | POA: Diagnosis not present

## 2017-04-07 DIAGNOSIS — R000 Tachycardia, unspecified: Secondary | ICD-10-CM | POA: Diagnosis present

## 2017-04-07 DIAGNOSIS — E871 Hypo-osmolality and hyponatremia: Secondary | ICD-10-CM | POA: Diagnosis not present

## 2017-04-07 DIAGNOSIS — Z9081 Acquired absence of spleen: Secondary | ICD-10-CM | POA: Diagnosis not present

## 2017-04-07 DIAGNOSIS — D638 Anemia in other chronic diseases classified elsewhere: Secondary | ICD-10-CM | POA: Diagnosis present

## 2017-04-07 DIAGNOSIS — R0602 Shortness of breath: Secondary | ICD-10-CM | POA: Diagnosis not present

## 2017-04-07 DIAGNOSIS — D649 Anemia, unspecified: Secondary | ICD-10-CM | POA: Diagnosis present

## 2017-04-07 DIAGNOSIS — J9811 Atelectasis: Secondary | ICD-10-CM | POA: Diagnosis not present

## 2017-04-07 LAB — URINALYSIS, ROUTINE W REFLEX MICROSCOPIC
Bacteria, UA: NONE SEEN
Glucose, UA: 150 mg/dL — AB
KETONES UR: NEGATIVE mg/dL
Leukocytes, UA: NEGATIVE
Nitrite: NEGATIVE
PH: 8 (ref 5.0–8.0)
Protein, ur: >=300 mg/dL — AB
SPECIFIC GRAVITY, URINE: 1.014 (ref 1.005–1.030)

## 2017-04-07 LAB — CBC WITH DIFFERENTIAL/PLATELET
BASOS ABS: 0 10*3/uL (ref 0.0–0.1)
Basophils Relative: 0 %
EOS PCT: 0 %
Eosinophils Absolute: 0 10*3/uL (ref 0.0–0.7)
HEMATOCRIT: 19.2 % — AB (ref 39.0–52.0)
Hemoglobin: 6.9 g/dL — CL (ref 13.0–17.0)
LYMPHS ABS: 2.9 10*3/uL (ref 0.7–4.0)
Lymphocytes Relative: 12 %
MCH: 35.4 pg — ABNORMAL HIGH (ref 26.0–34.0)
MCHC: 35.9 g/dL (ref 30.0–36.0)
MCV: 98.5 fL (ref 78.0–100.0)
MONOS PCT: 9 %
Monocytes Absolute: 2.2 10*3/uL — ABNORMAL HIGH (ref 0.1–1.0)
NEUTROS ABS: 18.8 10*3/uL — AB (ref 1.7–7.7)
Neutrophils Relative %: 79 %
Platelets: 163 10*3/uL (ref 150–400)
RBC: 1.95 MIL/uL — ABNORMAL LOW (ref 4.22–5.81)
RDW: 28.9 % — AB (ref 11.5–15.5)
WBC: 23.9 10*3/uL — ABNORMAL HIGH (ref 4.0–10.5)

## 2017-04-07 LAB — COMPREHENSIVE METABOLIC PANEL
ALBUMIN: 1.3 g/dL — AB (ref 3.5–5.0)
ALT: 45 U/L (ref 17–63)
AST: 130 U/L — AB (ref 15–41)
Alkaline Phosphatase: 227 U/L — ABNORMAL HIGH (ref 38–126)
Anion gap: 10 (ref 5–15)
BILIRUBIN TOTAL: 3.7 mg/dL — AB (ref 0.3–1.2)
BUN: 19 mg/dL (ref 6–20)
CO2: 23 mmol/L (ref 22–32)
Calcium: 6.8 mg/dL — ABNORMAL LOW (ref 8.9–10.3)
Chloride: 99 mmol/L — ABNORMAL LOW (ref 101–111)
Creatinine, Ser: 1.84 mg/dL — ABNORMAL HIGH (ref 0.61–1.24)
GFR calc Af Amer: 54 mL/min — ABNORMAL LOW (ref >60)
GFR calc non Af Amer: 46 mL/min — ABNORMAL LOW (ref >60)
GLUCOSE: 126 mg/dL — AB (ref 65–99)
POTASSIUM: 4.2 mmol/L (ref 3.5–5.1)
Sodium: 132 mmol/L — ABNORMAL LOW (ref 135–145)
TOTAL PROTEIN: 4.5 g/dL — AB (ref 6.5–8.1)

## 2017-04-07 LAB — RETICULOCYTES
RBC.: 1.95 MIL/uL — ABNORMAL LOW (ref 4.22–5.81)
Retic Ct Pct: >23 % — ABNORMAL HIGH (ref 0.4–3.1)

## 2017-04-07 LAB — TYPE AND SCREEN
ABO/RH(D): O POS
Antibody Screen: NEGATIVE

## 2017-04-07 MED ORDER — SODIUM CHLORIDE 0.9 % IV SOLN
INTRAVENOUS | Status: DC
  Administered 2017-04-07 – 2017-04-08 (×2): via INTRAVENOUS

## 2017-04-07 MED ORDER — HYDROMORPHONE HCL 2 MG/ML IJ SOLN: 2.0000 mg | INTRAMUSCULAR | Status: AC

## 2017-04-07 MED ORDER — DIPHENHYDRAMINE HCL 50 MG/ML IJ SOLN
25.0000 mg | Freq: Once | INTRAMUSCULAR | Status: AC
  Administered 2017-04-07: 25 mg via INTRAVENOUS
  Filled 2017-04-07: qty 1

## 2017-04-07 MED ORDER — HYDROMORPHONE HCL 2 MG/ML IJ SOLN
2.0000 mg | INTRAMUSCULAR | Status: AC
  Administered 2017-04-07: 2 mg via INTRAVENOUS

## 2017-04-07 MED ORDER — HYDROMORPHONE HCL 2 MG/ML IJ SOLN
2.0000 mg | INTRAMUSCULAR | Status: DC
  Filled 2017-04-07: qty 1

## 2017-04-07 MED ORDER — SODIUM BICARBONATE 650 MG PO TABS
650.0000 mg | ORAL_TABLET | Freq: Three times a day (TID) | ORAL | Status: DC
  Administered 2017-04-07 – 2017-04-08 (×3): 650 mg via ORAL
  Filled 2017-04-07 (×5): qty 1

## 2017-04-07 MED ORDER — FOLIC ACID 1 MG PO TABS
1.0000 mg | ORAL_TABLET | Freq: Every day | ORAL | Status: DC
  Administered 2017-04-08: 1 mg via ORAL
  Filled 2017-04-07: qty 1

## 2017-04-07 MED ORDER — HYDROMORPHONE HCL 1 MG/ML IJ SOLN
1.0000 mg | INTRAMUSCULAR | Status: DC | PRN
  Administered 2017-04-07: 1 mg via INTRAVENOUS
  Filled 2017-04-07 (×2): qty 1

## 2017-04-07 MED ORDER — HYDROMORPHONE HCL 2 MG/ML IJ SOLN: 2.0000 mg | INTRAMUSCULAR | Status: DC

## 2017-04-07 MED ORDER — SODIUM CHLORIDE 0.9 % IV BOLUS (SEPSIS)
1000.0000 mL | Freq: Once | INTRAVENOUS | Status: AC
  Administered 2017-04-07: 1000 mL via INTRAVENOUS

## 2017-04-07 MED ORDER — HYDROMORPHONE HCL 1 MG/ML IJ SOLN
0.5000 mg | Freq: Once | INTRAMUSCULAR | Status: AC
  Administered 2017-04-07: 0.5 mg via INTRAVENOUS
  Filled 2017-04-07: qty 0.5

## 2017-04-07 MED ORDER — PIPERACILLIN-TAZOBACTAM 3.375 G IVPB
3.3750 g | Freq: Three times a day (TID) | INTRAVENOUS | Status: DC
  Administered 2017-04-07 – 2017-04-09 (×5): 3.375 g via INTRAVENOUS
  Filled 2017-04-07 (×6): qty 50

## 2017-04-07 MED ORDER — PIPERACILLIN-TAZOBACTAM 3.375 G IVPB 30 MIN
3.3750 g | Freq: Once | INTRAVENOUS | Status: AC
  Administered 2017-04-07: 3.375 g via INTRAVENOUS
  Filled 2017-04-07: qty 50

## 2017-04-07 MED ORDER — DIPHENHYDRAMINE HCL 25 MG PO CAPS
25.0000 mg | ORAL_CAPSULE | ORAL | Status: DC | PRN
  Administered 2017-04-08: 50 mg via ORAL
  Filled 2017-04-07: qty 2

## 2017-04-07 MED ORDER — HYDROMORPHONE HCL 2 MG/ML IJ SOLN
2.0000 mg | INTRAMUSCULAR | Status: AC
  Administered 2017-04-07: 2 mg via INTRAVENOUS
  Filled 2017-04-07: qty 1

## 2017-04-07 NOTE — ED Notes (Signed)
ED TO INPATIENT HANDOFF REPORT  Name/Age/Gender [name] [name]. 37 y.o. male  Code Status    Code Status Orders  (From admission, onward)        Start     Ordered   04/07/17 1713  Full code  Continuous     04/07/17 1712    Code Status History    Date Active Date Inactive Code Status Order ID Comments User Context   02/27/2017 21:40 03/04/2017 15:08 Full Code [id number]  Jani Gravel, MD ED   12/13/2016 18:12 12/21/2016 16:59 Full Code [id number]  Leana Gamer, MD ED   10/14/2016 19:44 10/18/2016 16:37 Full Code [id number]  Jani Gravel, MD ED   08/02/2016 01:24 08/04/2016 17:31 Full Code [id number]  Norval Morton, MD ED   02/26/2016 21:48 02/27/2016 17:50 Full Code [id number]  Etta Quill, DO ED   03/11/2015 16:36 03/15/2015 15:44 Full Code [id number]  Janora Norlander, DO ED   11/21/2014 03:08 11/24/2014 20:20 Full Code [id number]  Aquilla Hacker, MD Inpatient   08/18/2014 01:48 08/20/2014 18:20 Full Code [id number]  Archie Patten, MD Inpatient   03/22/2014 23:15 03/24/2014 18:26 Full Code [id number]  Toy Baker, MD ED   03/19/2014 20:48 03/20/2014 22:50 Full Code [id number]  Lorna Few, DO Inpatient   12/26/2013 03:44 12/29/2013 21:03 Full Code [id number]  Timmothy Euler, MD ED   07/28/2013 18:33 07/31/2013 17:29 Full Code [id number]  Coral Spikes, DO Inpatient   05/09/2013 19:26 05/10/2013 16:44 Full Code [id number]  Hilton Sinclair, MD ED   01/24/2013 01:43 01/26/2013 19:31 Full Code [id number]  Leone Haven, MD Inpatient   09/25/2012 08:46 09/26/2012 20:20 Full Code [id number]  Ma Hillock, DO ED      Home/SNF/Other Home  Chief Complaint sickle cell pain; difficulty breathing;   Level of Care/Admitting Diagnosis ED Disposition    ED Disposition Condition [facility] Area: [facility] [[id number]]  Level of Care: Med-Surg [16]  Diagnosis: Colitis [428768]  Admitting Physician: Marene Lenz [[id number]]  Attending  Physician: Jodie Echevaria, AMRIT [id number]  Estimated length of stay: past midnight tomorrow  Certification:: I certify this patient will need inpatient services for at least 2 midnights  PT Class (Do Not Modify): Inpatient [101]  PT Acc Code (Do Not Modify): Private [1]       Medical History Past Medical History:  Diagnosis Date  . Bacterial pneumonia ~ 2012   "caught it here in the hospital" (09/25/2012)  . Chronic kidney disease    "from my sickle cell" (09/25/2012)  . CKD (chronic kidney disease), stage II   . Cyst of brain    "2 really small ones in the back of my head; inside; saw them w/MRI" (09/25/2012)  . GERD (gastroesophageal reflux disease)    "after I eat alot of spicey foods" (09/25/2012)  . Gynecomastia, male 07/10/2012  . History of blood transfusion    "always related to sickle cell crisis" (09/25/2012)  . Migraines    "take RX qd to prevent them" (09/25/2012)  . Sickle cell anemia (HCC)   . Sickle cell crisis ([place]) 09/25/2012  . Sickle cell nephropathy ([place]) 07/10/2012  . Sinus tachycardia   . Tachycardia with heart rate 121-140 beats per minute with ambulation 08/04/2016    Allergies Allergies  Allergen Reactions  . Nsaids   . Morphine And Related Other (See Comments)    "real bad headaches"    IV Location/Drains/Wounds Patient Lines/Drains/Airways  Status   Active Line/Drains/Airways    Name:   Placement date:   Placement time:   Site:   Days:   Peripheral IV 04/07/17 Left;Upper Arm   04/07/17    1333    Arm   less than 1          Labs/Imaging Results for orders placed or performed during the hospital encounter of 04/07/17 (from the past 48 hour(s))  Comprehensive metabolic panel     Status: Abnormal   Collection Time: 04/07/17  1:08 PM  Result Value Ref Range   Sodium 132 (L) 135 - 145 mmol/L   Potassium 4.2 3.5 - 5.1 mmol/L   Chloride 99 (L) 101 - 111 mmol/L   CO2 23 22 - 32 mmol/L   Glucose, Bld 126 (H) 65 - 99 mg/dL   BUN 19 6 - 20 mg/dL    Creatinine, Ser 1.84 (H) 0.61 - 1.24 mg/dL   Calcium 6.8 (L) 8.9 - 10.3 mg/dL   Total Protein 4.5 (L) 6.5 - 8.1 g/dL   Albumin 1.3 (L) 3.5 - 5.0 g/dL   AST 130 (H) 15 - 41 U/L   ALT 45 17 - 63 U/L   Alkaline Phosphatase 227 (H) 38 - 126 U/L   Total Bilirubin 3.7 (H) 0.3 - 1.2 mg/dL   GFR calc non Af Amer 46 (L) >60 mL/min   GFR calc Af Amer 54 (L) >60 mL/min    Comment: (NOTE) The eGFR has been calculated using the CKD EPI equation. This calculation has not been validated in all clinical situations. eGFR's persistently <60 mL/min signify possible Chronic Kidney Disease.    Anion gap 10 5 - 15    Comment: Performed at [facility], [place] [address] [place], [place] [address]  CBC WITH DIFFERENTIAL     Status: Abnormal   Collection Time: 04/07/17  1:08 PM  Result Value Ref Range   WBC 23.9 (H) 4.0 - 10.5 K/uL   RBC 1.95 (L) 4.22 - 5.81 MIL/uL   Hemoglobin 6.9 (LL) 13.0 - 17.0 g/dL    Comment: RESULT REPEATED AND VERIFIED CRITICAL RESULT CALLED TO, READ BACK BY AND VERIFIED WITH: Zoua Caporaso,R AT 1355 ON [id number] BY HOOKER,B    HCT 19.2 (L) 39.0 - 52.0 %   MCV 98.5 78.0 - 100.0 fL   MCH 35.4 (H) 26.0 - 34.0 pg   MCHC 35.9 30.0 - 36.0 g/dL   RDW 28.9 (H) 11.5 - 15.5 %   Platelets 163 150 - 400 K/uL    Comment: RESULT REPEATED AND VERIFIED SPECIMEN CHECKED FOR CLOTS CONSISTENT WITH PREVIOUS RESULT    Neutrophils Relative % 79 %   Lymphocytes Relative 12 %   Monocytes Relative 9 %   Eosinophils Relative 0 %   Basophils Relative 0 %   Neutro Abs 18.8 (H) 1.7 - 7.7 K/uL   Lymphs Abs 2.9 0.7 - 4.0 K/uL   Monocytes Absolute 2.2 (H) 0.1 - 1.0 K/uL   Eosinophils Absolute 0.0 0.0 - 0.7 K/uL   Basophils Absolute 0.0 0.0 - 0.1 K/uL   RBC Morphology RARE NRBCs     Comment: Sickle cells present TARGET CELLS POLYCHROMASIA PRESENT Performed at [facility], [place] [address], [place], [place] [address]   Reticulocytes     Status: Abnormal   Collection  Time: 04/07/17  1:08 PM  Result Value Ref Range   Retic Ct Pct >23.0 (H) 0.4 - 3.1 %   RBC. 1.95 (L) 4.22 - 5.81  MIL/uL   Retic Count, Absolute NOT CALCULATED 19.0 - 186.0 K/uL    Comment: Performed at [facility], [place] [address] [place], [place] [address]  Type and screen     Status: None   Collection Time: 04/07/17  2:00 PM  Result Value Ref Range   ABO/RH(D) O POS    Antibody Screen NEG    Sample Expiration      04/10/2017 Performed at [facility], [place] [address], [place], [place] [address]   Urinalysis, Routine w reflex microscopic     Status: Abnormal   Collection Time: 04/07/17  5:57 PM  Result Value Ref Range   Color, Urine AMBER (A) YELLOW    Comment: BIOCHEMICALS MAY BE AFFECTED BY COLOR   APPearance CLEAR CLEAR   Specific Gravity, Urine 1.014 1.005 - 1.030   pH 8.0 5.0 - 8.0   Glucose, UA 150 (A) NEGATIVE mg/dL   Hgb urine dipstick LARGE (A) NEGATIVE   Bilirubin Urine SMALL (A) NEGATIVE   Ketones, ur NEGATIVE NEGATIVE mg/dL   Protein, ur >=300 (A) NEGATIVE mg/dL   Nitrite NEGATIVE NEGATIVE   Leukocytes, UA NEGATIVE NEGATIVE   RBC / HPF 0-5 0 - 5 RBC/hpf   WBC, UA 0-5 0 - 5 WBC/hpf   Bacteria, UA NONE SEEN NONE SEEN   Squamous Epithelial / LPF 0-5 (A) NONE SEEN   Mucus PRESENT     Comment: Performed at [facility], [place] [address] [place], [place] [address]   Dg Chest Port 1 View  Result Date: 04/07/2017 CLINICAL DATA:  Sickle cell pain crisis.  Right flank pain. EXAM: PORTABLE CHEST 1 VIEW COMPARISON:  02/27/2017 FINDINGS: The heart is normal in size allowing for AP technique and low lung volumes. Lungs are under aerated with bibasilar atelectasis. No pneumothorax or pleural effusion. There are sclerotic changes and articular surface collapse involving the right humeral head consistent with avascular necrosis and bone infarct. IMPRESSION: Bibasilar atelectasis. Bony changes related to sickle cell disorder  and bone infarct. Electronically Signed   By: Marybelle Killings M.D.   On: 04/07/2017 14:03   Ct Renal Stone Study  Result Date: 04/07/2017 CLINICAL DATA:  Right-sided flank pain beginning today. Sickle cell disease. EXAM: CT ABDOMEN AND PELVIS WITHOUT CONTRAST TECHNIQUE: Multidetector CT imaging of the abdomen and pelvis was performed following the standard protocol without IV contrast. COMPARISON:  None. FINDINGS: Lower chest: Bibasilar scarring. Asymmetric opacity in left lung base may be due to atelectasis or infiltrate. Hepatobiliary: No mass visualized on this unenhanced exam. Prior cholecystectomy. No evidence of biliary obstruction. Pancreas: No mass or inflammatory process visualized on this unenhanced exam. Spleen: Tiny calcified spleen, consistent with auto splenectomy of sickle cell disease. Adrenals/Urinary tract: Bilateral renal parenchymal atrophy. No evidence of urolithiasis or hydronephrosis. Unremarkable unopacified urinary bladder. Stomach/Bowel: Mild to moderate wall thickening is seen involving the ascending and transverse colon, consistent with colitis. No evidence of bowel obstruction or abscess. Mild ascites noted. Vascular/Lymphatic: No pathologically enlarged lymph nodes identified. No evidence of abdominal aortic aneurysm. Reproductive:  No mass or other significant abnormality. Other:  None. Musculoskeletal: No suspicious bone lesions identified. Diffuse osteosclerosis, consistent with sickle cell disease. IMPRESSION: Colitis involving the ascending and transverse colon.  Mild ascites. No evidence of urolithiasis or hydronephrosis. Mild atelectasis or scarring in left lung base. Diffuse osteosclerosis and auto-splenectomy, consistent with chronic sickle cell disease. Electronically Signed   By: Earle Gell M.D.   On: 04/07/2017 15:56  Pending Labs Unresulted Labs (From admission, onward)   Start     Ordered   04/08/17 0500  Comprehensive metabolic panel  Tomorrow morning,   R      04/07/17 1712   04/08/17 0500  CBC with Differential/Platelet  Tomorrow morning,   R     04/07/17 1741   04/07/17 1742  Gastrointestinal Panel by PCR , Stool  (Gastrointestinal Panel by PCR, Stool)  Once,   R     04/07/17 1741   04/07/17 1742  Culture, blood (routine x 2)  BLOOD CULTURE X 2,   R     04/07/17 1741   04/07/17 1741  C difficile quick scan w PCR reflex  (C Difficile quick screen w PCR reflex panel)  Once, for 48 hours,   R    Comments:  Laxatives (last 72 hours)   None     Question Answer Comment  Is your patient experiencing loose or watery stools (3 or more in 24 hours)? Yes   Has the patient received laxatives in the last 24 hours? No   Has a negative Cdiff test resulted in the last 7 days? No      04/07/17 1741      Vitals/Pain Today's Vitals   04/07/17 1730 04/07/17 1809 04/07/17 1830 04/07/17 1845  BP: 107/65 113/71 118/73   Pulse: (!) 109 (!) 109 (!) 113   Resp: (!) '24 18 13   ' Temp:      TempSrc:      SpO2: 90% (!) 89% 95% 97%  Weight:      Height:      PainSc:        Isolation Precautions Enteric precautions (UV disinfection)  Medications Medications  diphenhydrAMINE (BENADRYL) capsule 25-50 mg (not administered)  HYDROmorphone (DILAUDID) injection 1 mg (not administered)  0.9 %  sodium chloride infusion (not administered)  piperacillin-tazobactam (ZOSYN) IVPB 3.375 g (not administered)  folic acid (FOLVITE) tablet 1 mg (not administered)  sodium bicarbonate tablet 650 mg (not administered)  HYDROmorphone (DILAUDID) injection 2 mg (2 mg Intravenous Given 04/07/17 1343)    Or  HYDROmorphone (DILAUDID) injection 2 mg ( Subcutaneous See Alternative 04/07/17 1343)  HYDROmorphone (DILAUDID) injection 2 mg (2 mg Intravenous Given 04/07/17 1415)    Or  HYDROmorphone (DILAUDID) injection 2 mg ( Subcutaneous See Alternative 04/07/17 1415)  diphenhydrAMINE (BENADRYL) injection 25 mg (25 mg Intravenous Given 04/07/17 1343)  piperacillin-tazobactam (ZOSYN) IVPB  3.375 g (0 g Intravenous Stopped 04/07/17 1739)  sodium chloride 0.9 % bolus 1,000 mL (1,000 mLs Intravenous New Bag/Given 04/07/17 1652)    Mobility walks

## 2017-04-07 NOTE — ED Notes (Signed)
Pt continues to remove nasal canula and wires that monitor pt on cardiac monitor.

## 2017-04-07 NOTE — ED Notes (Signed)
Date and time results received: 04/07/17 1354  Test: Hemoglobin  Critical Value: 6.9  Name of Provider Notified: [name] Fraise, MD  Orders Received? Or Actions Taken?: Actions Taken: Informed MD

## 2017-04-07 NOTE — ED Notes (Signed)
Pt removed Nasal Canula and pt was sating at about 87 to 85%. I put Nasal Canula back on pt and turned O2 up to 4L nasal canula.

## 2017-04-07 NOTE — ED Notes (Signed)
IV Ultrasound has been requested and Charge RN has been notified.

## 2017-04-07 NOTE — ED Provider Notes (Signed)
[facility] DEPT Provider Note   CSN: [id number] Arrival date & time: 04/07/17  1250     History   Chief Complaint Chief Complaint  Patient presents with  . Sickle Cell Pain Crisis  . Flank Pain  . Shortness of Breath    HPI [name] [name]. is a 37 y.o. male.  The history is provided by the patient.  Sickle Cell Pain Crisis  Location:  Back Severity:  Severe Onset quality:  Sudden Timing:  Constant Progression:  Worsening Chronicity:  New Relieved by:  Nothing Worsened by:  Movement and deep breathing Associated symptoms: shortness of breath   Associated symptoms: no fever and no vomiting   Flank Pain  Associated symptoms include shortness of breath.  Shortness of Breath  Pertinent negatives include no fever and no vomiting.   Patient with history of sickle cell disease presents with acute right flank pain. He reports it hurts to breathe.  He thinks his kidneys may be shutting down  The rest of the history is difficult to obtain due to severe pain Past Medical History:  Diagnosis Date  . Bacterial pneumonia ~ 2012   "caught it here in the hospital" (09/25/2012)  . Chronic kidney disease    "from my sickle cell" (09/25/2012)  . CKD (chronic kidney disease), stage II   . Cyst of brain    "2 really small ones in the back of my head; inside; saw them w/MRI" (09/25/2012)  . GERD (gastroesophageal reflux disease)    "after I eat alot of spicey foods" (09/25/2012)  . Gynecomastia, male 07/10/2012  . History of blood transfusion    "always related to sickle cell crisis" (09/25/2012)  . Migraines    "take RX qd to prevent them" (09/25/2012)  . Sickle cell anemia (HCC)   . Sickle cell crisis ([place]) 09/25/2012  . Sickle cell nephropathy ([place]) 07/10/2012  . Sinus tachycardia   . Tachycardia with heart rate 121-140 beats per minute with ambulation 08/04/2016    Patient Active Problem List   Diagnosis Date Noted  . Abnormal liver function    . HCAP (healthcare-associated pneumonia)   . QT prolongation   . Pneumonia 02/27/2017  . Elevated troponin 02/27/2017  . Diarrhea 02/27/2017  . Soft tissue swelling of chest wall 12/18/2016  . Hypoxia   . Acute kidney injury superimposed on chronic kidney disease ([place]) 12/13/2016  . Vasoocclusive sickle cell crisis ([place]) 12/13/2016  . Sickle cell crisis ([place]) 10/14/2016  . Hyponatremia 10/14/2016  . Tachycardia with heart rate 121-140 beats per minute with ambulation 08/04/2016  . Metabolic acidosis 90/24/0973  . Anemia 08/02/2016  . Macrocytosis due to Hydroxyurea 08/02/2016  . Sickle cell pain crisis ([place]) 01/24/2013  . Chronic, continuous use of opioids 08/30/2012  . Chronic headaches 07/10/2012  . Gynecomastia, male 07/10/2012  . Sickle cell nephropathy ([place]) 07/10/2012  . Tachycardia 12/08/2011  . Systolic murmur 53/29/9242  . Migraine 11/26/2009  . CHRONIC KIDNEY DISEASE STAGE II (MILD) 03/06/2009  . Sickle cell disease, type SS.  06/18/2008  . TOBACCO ABUSE 05/22/2007    Past Surgical History:  Procedure Laterality Date  . CHOLECYSTECTOMY  ~ 2012  . IR FLUORO GUIDE CV LINE RIGHT  12/17/2016  . IR REMOVAL TUN CV CATH W/O FL  12/21/2016  . IR US GUIDE VASC ACCESS RIGHT  12/17/2016       Home Medications    Prior to Admission medications   Medication Sig Start Date End Date Taking? Authorizing  Provider  enalapril (VASOTEC) 5 MG tablet Take 5 mg by mouth daily. 12/09/16   [provider]  folic acid (FOLVITE) 1 MG tablet Take 1 tablet (1 mg total) daily by mouth. 12/22/16   Leana Gamer, MD  hydroxyurea (HYDREA) 500 MG capsule TAKE 3 CAPSULES (1,500 MG TOTAL) BY MOUTH DAILY. Hold taking this medication until discussed with Dr. Alyson Ingles as patient is not taking regularly and the dose which he is prescribed when taken regularly has demonstrated bone marrow supression 12/21/16   Leana Gamer, MD  Menthol-Methyl Salicylate (MUSCLE RUB) 10-15 % CREA  Apply 1 application as needed topically for muscle pain. Patient not taking: Reported on 02/27/2017 12/21/16   Leana Gamer, MD  nortriptyline (PAMELOR) 25 MG capsule TAKE 1 CAPSULE (25 MG TOTAL) BY MOUTH AT BEDTIME. 01/18/17   Donnamae Jude, MD  Oxycodone HCl 10 MG TABS Take 1 tablet (10 mg total) by mouth every 6 (six) hours as needed. 03/04/17   [name], MD  sodium bicarbonate 650 MG tablet Take 1 tablet (650 mg total) 3 (three) times daily by mouth. 12/21/16   Leana Gamer, MD    Family History Family History  Problem Relation Age of Onset  . Breast cancer Mother     Social History Social History   Tobacco Use  . Smoking status: Current Some Day Smoker    Types: Cigarettes  . Smokeless tobacco: Never Used  . Tobacco comment: 09/25/2012 "I don't buy cigarettes; bum one from friends q now and then"  Substance Use Topics  . Alcohol use: Yes    Alcohol/week: 0.0 oz    Comment: occasionally  . Drug use: No    Comment: 09/25/2012 "weed was my biggest problem; stopped that ~ 1 1/2 months ago; I don't do cocaine; can't say I never have though"     Allergies   Nsaids and Morphine and related   Review of Systems Review of Systems  Constitutional: Negative for fever.  Respiratory: Positive for shortness of breath.   Gastrointestinal: Negative for vomiting.  Genitourinary: Positive for flank pain.  All other systems reviewed and are negative.    Physical Exam Updated Vital Signs BP 110/80 (BP Location: Left Arm)   Pulse (!) 126   Temp 99.2 F (37.3 C) (Oral)   Resp (!) 24   SpO2 (!) 89%   Physical Exam  CONSTITUTIONAL: Anxious HEAD: Normocephalic/atraumatic EYES: EOMI/PERRL ENMT: Mucous membranes moist NECK: supple no meningeal signs SPINE/BACK:entire spine nontender CV: S1/S2 noted, no murmurs/rubs/gallops noted LUNGS: Tachypnea/decreased breath sounds bilaterally ABDOMEN: soft, nontender, no rebound or guarding, bowel sounds noted  throughout abdomen GU: Right Cva tenderness NEURO: Pt is awake/alert/appropriate, moves all extremitiesx4.  No facial droop.  No focal weakness noted EXTREMITIES: pulses normal/equal, full ROM, no joint effusion noted SKIN: warm, color normal PSYCH: Anxious  ED Treatments / Results  Labs (all labs ordered are listed, but only abnormal results are displayed) Labs Reviewed  COMPREHENSIVE METABOLIC PANEL - Abnormal; Notable for the following components:      Result Value   Sodium 132 (*)    Chloride 99 (*)    Glucose, Bld 126 (*)    Creatinine, Ser 1.84 (*)    Calcium 6.8 (*)    Total Protein 4.5 (*)    Albumin 1.3 (*)    AST 130 (*)    Alkaline Phosphatase 227 (*)    Total Bilirubin 3.7 (*)    GFR calc non Af Amer 46 (*)  GFR calc Af Amer 54 (*)    All other components within normal limits  CBC WITH DIFFERENTIAL/PLATELET - Abnormal; Notable for the following components:   WBC 23.9 (*)    RBC 1.95 (*)    Hemoglobin 6.9 (*)    HCT 19.2 (*)    MCH 35.4 (*)    RDW 28.9 (*)    Neutro Abs 18.8 (*)    Monocytes Absolute 2.2 (*)    All other components within normal limits  RETICULOCYTES - Abnormal; Notable for the following components:   Retic Ct Pct >23.0 (*)    RBC. 1.95 (*)    All other components within normal limits  URINALYSIS, ROUTINE W REFLEX MICROSCOPIC  TYPE AND SCREEN    EKG  EKG Interpretation  Date/Time:  Saturday April 07 2017 12:55:23 EST Ventricular Rate:  126 PR Interval:    QRS Duration: 68 QT Interval:  284 QTC Calculation: 412 R Axis:   71 Text Interpretation:  Sinus tachycardia Nonspecific T abnormalities, diffuse leads Baseline wander in lead(s) II III aVF Abnormal ekg Confirmed by [name] Fraise ([id number]) on 04/07/2017 12:58:28 PM       Radiology Dg Chest Port 1 View  Result Date: 04/07/2017 CLINICAL DATA:  Sickle cell pain crisis.  Right flank pain. EXAM: PORTABLE CHEST 1 VIEW COMPARISON:  02/27/2017 FINDINGS: The heart is normal in  size allowing for AP technique and low lung volumes. Lungs are under aerated with bibasilar atelectasis. No pneumothorax or pleural effusion. There are sclerotic changes and articular surface collapse involving the right humeral head consistent with avascular necrosis and bone infarct. IMPRESSION: Bibasilar atelectasis. Bony changes related to sickle cell disorder and bone infarct. Electronically Signed   By: Marybelle Killings M.D.   On: 04/07/2017 14:03   Ct Renal Stone Study  Result Date: 04/07/2017 CLINICAL DATA:  Right-sided flank pain beginning today. Sickle cell disease. EXAM: CT ABDOMEN AND PELVIS WITHOUT CONTRAST TECHNIQUE: Multidetector CT imaging of the abdomen and pelvis was performed following the standard protocol without IV contrast. COMPARISON:  None. FINDINGS: Lower chest: Bibasilar scarring. Asymmetric opacity in left lung base may be due to atelectasis or infiltrate. Hepatobiliary: No mass visualized on this unenhanced exam. Prior cholecystectomy. No evidence of biliary obstruction. Pancreas: No mass or inflammatory process visualized on this unenhanced exam. Spleen: Tiny calcified spleen, consistent with auto splenectomy of sickle cell disease. Adrenals/Urinary tract: Bilateral renal parenchymal atrophy. No evidence of urolithiasis or hydronephrosis. Unremarkable unopacified urinary bladder. Stomach/Bowel: Mild to moderate wall thickening is seen involving the ascending and transverse colon, consistent with colitis. No evidence of bowel obstruction or abscess. Mild ascites noted. Vascular/Lymphatic: No pathologically enlarged lymph nodes identified. No evidence of abdominal aortic aneurysm. Reproductive:  No mass or other significant abnormality. Other:  None. Musculoskeletal: No suspicious bone lesions identified. Diffuse osteosclerosis, consistent with sickle cell disease. IMPRESSION: Colitis involving the ascending and transverse colon.  Mild ascites. No evidence of urolithiasis or  hydronephrosis. Mild atelectasis or scarring in left lung base. Diffuse osteosclerosis and auto-splenectomy, consistent with chronic sickle cell disease. Electronically Signed   By: Earle Gell M.D.   On: 04/07/2017 15:56    Procedures Procedures    Medications Ordered in ED Medications  HYDROmorphone (DILAUDID) injection 2 mg ( Intravenous Canceled Entry 04/07/17 1454)    Or  HYDROmorphone (DILAUDID) injection 2 mg ( Subcutaneous See Alternative 04/07/17 1454)  HYDROmorphone (DILAUDID) injection 2 mg (0 mg Intravenous Hold 04/07/17 1524)    Or  HYDROmorphone (DILAUDID) injection 2  mg ( Subcutaneous See Alternative 04/07/17 1524)  diphenhydrAMINE (BENADRYL) capsule 25-50 mg (not administered)  piperacillin-tazobactam (ZOSYN) IVPB 3.375 g (not administered)  sodium chloride 0.9 % bolus 1,000 mL (not administered)  HYDROmorphone (DILAUDID) injection 2 mg (2 mg Intravenous Given 04/07/17 1343)    Or  HYDROmorphone (DILAUDID) injection 2 mg ( Subcutaneous See Alternative 04/07/17 1343)  HYDROmorphone (DILAUDID) injection 2 mg (2 mg Intravenous Given 04/07/17 1415)    Or  HYDROmorphone (DILAUDID) injection 2 mg ( Subcutaneous See Alternative 04/07/17 1415)  diphenhydrAMINE (BENADRYL) injection 25 mg (25 mg Intravenous Given 04/07/17 1343)     Initial Impression / Assessment and Plan / ED Course  I have reviewed the triage vital signs and the nursing notes.  Pertinent labs & imaging results that were available during my care of the patient were reviewed by me and considered in my medical decision making (see chart for details).     1:25 PM Patient with history of sickle cell disease presents with acute onset of right flank pain.  Difficult to determine if this is CVA tenderness versus pulmonary issue.  He is tachypneic and tachycardic, was initially hypoxic.  Chest x-ray has been ordered.  We will treat pain and reassess will follow closely 3:14 PM Patient appears improved.  He is resting  comfortably.  He reports persistent right flank tenderness.  On exam he does have right CVA tenderness.  Will obtain CT renal.  Chest x-ray was negative. 4:23 PM Reporting persistent flank pain, also having diffuse abdominal pain.  CT imaging reveals colitis, no obvious urologic issue. He has  elevated white count and Has persistent pain.  Will admit for IV antibiotics. Mother reports patient has been having increasing diarrhea recently D/w hospitalist for admission  Final Clinical Impressions(s) / ED Diagnoses   Final diagnoses:  Hb-SS disease with crisis [facility])  Colitis    ED Discharge Orders    None       [name] Fraise, MD 04/07/17 1626

## 2017-04-07 NOTE — ED Notes (Signed)
Patient transported to CT 

## 2017-04-07 NOTE — Progress Notes (Signed)
Pharmacy Antibiotic Note  [name] [name]. is a 37 y.o. male admitted on 04/07/2017 with IAI.  Pharmacy has been consulted for Zosyn dosing.  Plan: Zosyn 3.375g IV q8h (4 hour infusion).  Height: 5\' 3"  (160 cm) Weight: 125 lb (56.7 kg) IBW/kg (Calculated) : 56.9  Temp (24hrs), Avg:98.7 F (37.1 C), Min:98.2 F (36.8 C), Max:99.2 F (37.3 C)  Recent Labs  Lab 04/07/17 1308  WBC 23.9*  CREATININE 1.84*    Estimated Creatinine Clearance: 45.4 mL/min (A) (by C-G formula based on SCr of 1.84 mg/dL (H)).    Allergies  Allergen Reactions  . Nsaids   . Morphine And Related Other (See Comments)    "real bad headaches"     Thank you for allowing pharmacy to be a part of this patient's care.  Hershal Coria 04/07/2017 5:10 PM

## 2017-04-07 NOTE — ED Notes (Signed)
Patient Sp02 dropped into 80s. Found patient sleeping with nasal cannula on head. Replaced nasal cannula and patient continued to rub at their nose and remove nasal cannula.

## 2017-04-07 NOTE — ED Triage Notes (Signed)
Patient c/o sickle cell pain and rigt flank pain that started today. Reports making it hard for patient to breathe.  Reports urine is dark and does seen kidney specialist.

## 2017-04-07 NOTE — ED Notes (Signed)
Bed: JY11 Expected date:  Expected time:  Means of arrival:  Comments: RESA

## 2017-04-07 NOTE — ED Notes (Signed)
[facility] [address]. Receiving RN stated they would call me back.

## 2017-04-07 NOTE — H&P (Addendum)
History and Physical    [name] [name]. KGU:[id number] DOB: 12/29/1980 DOA: 04/07/2017  PCP: [name], MD   Patient coming from: Home    Chief Complaint: Right Flank pain  HPI: [name] [name]. is a 37 y.o. 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  Patient is a poor historian so most of the  information were taken from his mother. As per the mother, he was having right flank pain that was worsening and  he had to be brought to the emergency department today.  She also reported that patient has had several loose stools since last 2 days.  He was also vomiting.  Patient and the mother denies any fever or chills.  CT stone study was ordered in the emergency department showed colitis involving the ascending and transverse colon Patient has history of sickle cell disease.  He was admitted here on 02/27/17 and was discharged on 03/04/17 when he was treated for acute chest syndrome.  Patient follows with Dr. Zigmund Daniel when he is admitted here but he does not have regular follow-up at sickle cell clinic.  Family is trying to arrange follow-up at [facility].  Patient is on folic acid at home but not on hydroxyurea.  ED Course: Patient was treated with IV fluids and given a dose of Zosyn .He was given multiple doses of Dilaudid.  Review of Systems: As per HPI otherwise 10 point review of systems negative.    Past Medical History:  Diagnosis Date  . Bacterial pneumonia ~ 2012   "caught it here in the hospital" (09/25/2012)  . Chronic kidney disease    "from my sickle cell" (09/25/2012)  . CKD (chronic kidney disease), stage II   . Cyst of brain    "2 really small ones in the back of my head; inside; saw them w/MRI" (09/25/2012)  . GERD (gastroesophageal reflux disease)    "after I eat alot of spicey foods" (09/25/2012)  . Gynecomastia, male 07/10/2012  . History of blood transfusion    "always related to sickle cell crisis" (09/25/2012)  . Migraines    "take RX qd to prevent them" (09/25/2012)  . Sickle cell anemia (HCC)   . Sickle cell crisis ([place]) 09/25/2012  . Sickle cell nephropathy ([place]) 07/10/2012  . Sinus tachycardia   . Tachycardia with heart rate 121-140 beats per minute with ambulation 08/04/2016    Past Surgical History:  Procedure Laterality Date  . CHOLECYSTECTOMY  ~ 2012  . IR FLUORO GUIDE CV LINE RIGHT  12/17/2016  . IR REMOVAL TUN CV CATH W/O FL  12/21/2016  . IR US GUIDE VASC ACCESS RIGHT  12/17/2016     reports that he has been smoking cigarettes.  he has never used smokeless tobacco. He reports that he drinks alcohol. He reports that he does not use drugs.  Allergies  Allergen Reactions  . Nsaids   . Morphine And Related Other (See Comments)    "real bad headaches"    Family History  Problem Relation Age of Onset  . Breast cancer Mother      Prior to Admission medications   Medication Sig Start Date End Date Taking? Authorizing Provider  folic acid (FOLVITE) 1 MG tablet Take 1 tablet (1 mg total) daily by mouth. 12/22/16  Yes Leana Gamer, MD  Menthol-Methyl Salicylate (MUSCLE RUB) 10-15 % CREA Apply 1 application as needed topically for muscle pain. 12/21/16  Yes Leana Gamer, MD  nortriptyline (PAMELOR) 25 MG capsule TAKE 1 CAPSULE (25 MG TOTAL) BY MOUTH AT BEDTIME. 01/18/17  Yes Donnamae Jude, MD  Oxycodone HCl 10 MG TABS Take 1 tablet (10 mg total) by mouth every 6 (six) hours as needed. 03/04/17  Yes [name], MD  sodium bicarbonate 650 MG tablet Take 1 tablet (650 mg total) 3 (three) times daily by mouth. 12/21/16  Yes Leana Gamer, MD  enalapril (VASOTEC) 5 MG tablet Take 5 mg by mouth daily. 12/09/16   [provider]  hydroxyurea (HYDREA) 500 MG capsule TAKE 3 CAPSULES (1,500 MG  TOTAL) BY MOUTH DAILY. Hold taking this medication until discussed with Dr. Alyson Ingles as patient is not taking regularly and the dose which he is prescribed when taken regularly has demonstrated bone marrow supression 12/21/16   Leana Gamer, MD    Physical Exam: Vitals:   04/07/17 1523 04/07/17 1530 04/07/17 1604 04/07/17 1630  BP:  116/64 106/63 113/71  Pulse:  (!) 111 (!) 118 (!) 119  Resp:  11 (!) 21 15  Temp:      TempSrc:      SpO2: (S) 96% 98% 95% 92%  Weight:      Height:        Constitutional: NAD, calm, comfortable Vitals:   04/07/17 1523 04/07/17 1530 04/07/17 1604 04/07/17 1630  BP:  116/64 106/63 113/71  Pulse:  (!) 111 (!) 118 (!) 119  Resp:  11 (!) 21 15  Temp:      TempSrc:      SpO2: (S) 96% 98% 95% 92%  Weight:      Height:       Eyes: PERRL, lids and conjunctivae normal,icterus ENMT: Mucous membranes are moist. Posterior pharynx clear of any exudate or lesions.Normal dentition.  Neck: normal, supple, no masses, no thyromegaly Respiratory: B/L decreased air entry in the bases, no wheezing, no crackles. Normal respiratory effort. No accessory muscle use.  Cardiovascular: Regular rate and rhythm, no murmurs / rubs / gallops. No extremity edema. 2+ pedal pulses. No carotid bruits.  Abdomen: diffuse mild generalized tenderness, right costovertebral angle tenderness, no masses palpated. No hepatosplenomegaly. Bowel sounds positive.  Musculoskeletal: no clubbing / cyanosis. No joint deformity upper and lower extremities. Good ROM, no contractures. Normal muscle tone.  Skin: no rashes, lesions, ulcers. No induration Neurologic: CN 2-12 grossly intact. Sensation intact, DTR normal. Strength 5/5 in all 4.  Psychiatric: Normal judgment and insight. Alert and oriented x 3. Normal mood.   Foley Catheter:None  Labs on Admission: I have personally reviewed following labs and imaging studies  CBC: Recent Labs  Lab 04/07/17 1308  WBC 23.9*  NEUTROABS 18.8*    HGB 6.9*  HCT 19.2*  MCV 98.5  PLT 607   Basic Metabolic Panel: Recent Labs  Lab 04/07/17 1308  NA 132*  K 4.2  CL 99*  CO2 23  GLUCOSE 126*  BUN 19  CREATININE 1.84*  CALCIUM 6.8*   GFR: Estimated Creatinine Clearance: 45.4 mL/min (A) (by C-G formula based on SCr of 1.84 mg/dL (H)). Liver Function Tests: Recent Labs  Lab 04/07/17 1308  AST 130*  ALT 45  ALKPHOS 227*  BILITOT 3.7*  PROT 4.5*  ALBUMIN 1.3*   No results for input(s): LIPASE, AMYLASE in the last 168 hours. No results for input(s): AMMONIA in the last 168  hours. Coagulation Profile: No results for input(s): INR, PROTIME in the last 168 hours. Cardiac Enzymes: No results for input(s): CKTOTAL, CKMB, CKMBINDEX, TROPONINI in the last 168 hours. BNP (last 3 results) No results for input(s): PROBNP in the last 8760 hours. HbA1C: No results for input(s): HGBA1C in the last 72 hours. CBG: No results for input(s): GLUCAP in the last 168 hours. Lipid Profile: No results for input(s): CHOL, HDL, LDLCALC, TRIG, CHOLHDL, LDLDIRECT in the last 72 hours. Thyroid Function Tests: No results for input(s): TSH, T4TOTAL, FREET4, T3FREE, THYROIDAB in the last 72 hours. Anemia Panel: Recent Labs    04/07/17 1308  RETICCTPCT >23.0*   Urine analysis:    Component Value Date/Time   COLORURINE AMBER (A) 02/27/2017 2041   APPEARANCEUR CLEAR 02/27/2017 2041   LABSPEC 1.029 02/27/2017 2041   PHURINE 7.0 02/27/2017 2041   GLUCOSEU 150 (A) 02/27/2017 2041   HGBUR LARGE (A) 02/27/2017 2041   HGBUR small 03/10/2010 [place] 02/27/2017 2041   KETONESUR NEGATIVE 02/27/2017 2041   PROTEINUR >=300 (A) 02/27/2017 2041   UROBILINOGEN 0.2 11/21/2014 2003   NITRITE NEGATIVE 02/27/2017 2041   LEUKOCYTESUR NEGATIVE 02/27/2017 2041    Radiological Exams on Admission: Dg Chest Port 1 View  Result Date: 04/07/2017 CLINICAL DATA:  Sickle cell pain crisis.  Right flank pain. EXAM: PORTABLE CHEST 1 VIEW  COMPARISON:  02/27/2017 FINDINGS: The heart is normal in size allowing for AP technique and low lung volumes. Lungs are under aerated with bibasilar atelectasis. No pneumothorax or pleural effusion. There are sclerotic changes and articular surface collapse involving the right humeral head consistent with avascular necrosis and bone infarct. IMPRESSION: Bibasilar atelectasis. Bony changes related to sickle cell disorder and bone infarct. Electronically Signed   By: Marybelle Killings M.D.   On: 04/07/2017 14:03   Ct Renal Stone Study  Result Date: 04/07/2017 CLINICAL DATA:  Right-sided flank pain beginning today. Sickle cell disease. EXAM: CT ABDOMEN AND PELVIS WITHOUT CONTRAST TECHNIQUE: Multidetector CT imaging of the abdomen and pelvis was performed following the standard protocol without IV contrast. COMPARISON:  None. FINDINGS: Lower chest: Bibasilar scarring. Asymmetric opacity in left lung base may be due to atelectasis or infiltrate. Hepatobiliary: No mass visualized on this unenhanced exam. Prior cholecystectomy. No evidence of biliary obstruction. Pancreas: No mass or inflammatory process visualized on this unenhanced exam. Spleen: Tiny calcified spleen, consistent with auto splenectomy of sickle cell disease. Adrenals/Urinary tract: Bilateral renal parenchymal atrophy. No evidence of urolithiasis or hydronephrosis. Unremarkable unopacified urinary bladder. Stomach/Bowel: Mild to moderate wall thickening is seen involving the ascending and transverse colon, consistent with colitis. No evidence of bowel obstruction or abscess. Mild ascites noted. Vascular/Lymphatic: No pathologically enlarged lymph nodes identified. No evidence of abdominal aortic aneurysm. Reproductive:  No mass or other significant abnormality. Other:  None. Musculoskeletal: No suspicious bone lesions identified. Diffuse osteosclerosis, consistent with sickle cell disease. IMPRESSION: Colitis involving the ascending and transverse colon.   Mild ascites. No evidence of urolithiasis or hydronephrosis. Mild atelectasis or scarring in left lung base. Diffuse osteosclerosis and auto-splenectomy, consistent with chronic sickle cell disease. Electronically Signed   By: Earle Gell M.D.   On: 04/07/2017 15:56     Assessment/Plan Principal Problem:   Colitis Active Problems:   Sickle cell disease, type SS.    CHRONIC KIDNEY DISEASE STAGE II (MILD)   Leucocytosis   Anemia   Sickle cell nephropathy (HCC)   Hyponatremia  Colitis: Presented with right flank pain, diarrhea and  vomiting.  CT imaging done in the emergency department was suggestive of colitis in the ascending and descending colon.  Patient also has leukocytosis. Started on Zosyn.  We will keep him n.p.o. We will check GI pathogen panel, C. Difficile.  We will follow-up blood cultures Ischemic colitis is also a possibility in this sickle cell patient.  Leukocytosis: We will continue IV antibiotics.  We will follow-up blood cultures.  Patient has elevated white cell counts on baseline.  Sickle cell disease: Known case of sickle cell disease.  He was admitted recently for the management of acute chest syndrome.  Continue pain management.  Continue IV fluids. He has diffuse osteosclerosis and auto-splenectomy secondary to chronic sickle cell disease.  Anemia: Hemoglobin was 6.9.  His baseline hemoglobin ranges from 6-7.  We will not transfuse at this point.  We will continue to trend his H&H.  Continue folic acid.  Chronic kidney disease stage III: Secondary to sickle cell nephropathy.  Follows with nephrology as an outpatient.  His baseline creatinine ranges from 1.6- 1.8. Kidney function is on baseline.  We will continue to monitor.  Patient is also on sodium bicarb at home.  Hyponatremia: Continue IV fluids.  Elevated liver enzymes: Elevated bilirubin and liver enzymes on baseline.  We will continue to monitor.    Severity of Illness   I certify that at the point of  admission it is my clinical judgment that the patient will require inpatient hospital care spanning beyond 2 midnights from the point of admission due to high intensity of service, high risk for further deterioration and high frequency of surveillance required.    DVT prophylaxis: SCD Code Status: Full Family Communication: Mother presented to the bedside Consults called: None     Marene Lenz MD [facility] Pager [phone]  If 7PM-7AM, please contact night-coverage [email or web address] Password [facility]  04/07/2017, 5:13 PM

## 2017-04-08 DIAGNOSIS — D72829 Elevated white blood cell count, unspecified: Secondary | ICD-10-CM

## 2017-04-08 DIAGNOSIS — N182 Chronic kidney disease, stage 2 (mild): Secondary | ICD-10-CM

## 2017-04-08 DIAGNOSIS — E871 Hypo-osmolality and hyponatremia: Secondary | ICD-10-CM

## 2017-04-08 DIAGNOSIS — N08 Glomerular disorders in diseases classified elsewhere: Secondary | ICD-10-CM

## 2017-04-08 DIAGNOSIS — D5700 Hb-SS disease with crisis, unspecified: Secondary | ICD-10-CM

## 2017-04-08 LAB — COMPREHENSIVE METABOLIC PANEL
ALT: 38 U/L (ref 17–63)
ANION GAP: 8 (ref 5–15)
AST: 94 U/L — ABNORMAL HIGH (ref 15–41)
Albumin: 1.1 g/dL — ABNORMAL LOW (ref 3.5–5.0)
Alkaline Phosphatase: 188 U/L — ABNORMAL HIGH (ref 38–126)
BUN: 20 mg/dL (ref 6–20)
CHLORIDE: 104 mmol/L (ref 101–111)
CO2: 22 mmol/L (ref 22–32)
Calcium: 7.1 mg/dL — ABNORMAL LOW (ref 8.9–10.3)
Creatinine, Ser: 2.16 mg/dL — ABNORMAL HIGH (ref 0.61–1.24)
GFR, EST AFRICAN AMERICAN: 44 mL/min — AB (ref >60)
GFR, EST NON AFRICAN AMERICAN: 38 mL/min — AB (ref >60)
Glucose, Bld: 99 mg/dL (ref 65–99)
Potassium: 4.1 mmol/L (ref 3.5–5.1)
SODIUM: 134 mmol/L — AB (ref 135–145)
Total Bilirubin: 2.1 mg/dL — ABNORMAL HIGH (ref 0.3–1.2)
Total Protein: 4.1 g/dL — ABNORMAL LOW (ref 6.5–8.1)

## 2017-04-08 LAB — CBC WITH DIFFERENTIAL/PLATELET
BASOS ABS: 0 10*3/uL (ref 0.0–0.1)
BASOS PCT: 0 %
EOS ABS: 0.4 10*3/uL (ref 0.0–0.7)
Eosinophils Relative: 2 %
HCT: 17.5 % — ABNORMAL LOW (ref 39.0–52.0)
HEMOGLOBIN: 6 g/dL — AB (ref 13.0–17.0)
LYMPHS PCT: 25 %
Lymphs Abs: 5 10*3/uL — ABNORMAL HIGH (ref 0.7–4.0)
MCH: 33.3 pg (ref 26.0–34.0)
MCHC: 34.3 g/dL (ref 30.0–36.0)
MCV: 97.2 fL (ref 78.0–100.0)
Monocytes Absolute: 1.4 10*3/uL — ABNORMAL HIGH (ref 0.1–1.0)
Monocytes Relative: 7 %
NEUTROS PCT: 66 %
Neutro Abs: 13.1 10*3/uL — ABNORMAL HIGH (ref 1.7–7.7)
PLATELETS: 118 10*3/uL — AB (ref 150–400)
RBC: 1.8 MIL/uL — AB (ref 4.22–5.81)
RDW: 28.7 % — ABNORMAL HIGH (ref 11.5–15.5)
WBC: 19.9 10*3/uL — AB (ref 4.0–10.5)

## 2017-04-08 MED ORDER — HYDROMORPHONE HCL 1 MG/ML IJ SOLN
1.0000 mg | INTRAMUSCULAR | Status: DC | PRN
  Filled 2017-04-08: qty 1

## 2017-04-08 MED ORDER — HYDROMORPHONE HCL 1 MG/ML IJ SOLN
1.0000 mg | INTRAMUSCULAR | Status: DC | PRN
  Administered 2017-04-08 (×3): 1 mg via INTRAVENOUS
  Filled 2017-04-08 (×2): qty 1

## 2017-04-08 MED ORDER — HYDROMORPHONE 1 MG/ML IV SOLN
INTRAVENOUS | Status: DC
  Administered 2017-04-08: 0.5 mg via INTRAVENOUS
  Filled 2017-04-08: qty 25

## 2017-04-08 MED ORDER — NALOXONE HCL 0.4 MG/ML IJ SOLN: 0.4000 mg | INTRAMUSCULAR | Status: DC | PRN

## 2017-04-08 MED ORDER — SODIUM CHLORIDE 0.9% FLUSH: 9.0000 mL | INTRAVENOUS | Status: DC | PRN

## 2017-04-08 MED ORDER — DEXTROSE-NACL 5-0.45 % IV SOLN
INTRAVENOUS | Status: DC
  Administered 2017-04-08 – 2017-04-09 (×2): via INTRAVENOUS

## 2017-04-08 NOTE — Progress Notes (Signed)
Entered room and found woman at bedside, pt attempted to pull up covers over him to hide food that she brought for him. Pt has been informed multiple times that part of his disease process requires for him to have bowel rest meaning that he cannot eat or drink anything. Also informed that noncompliance could lead to worsening health. Asked visitor to leave, informed house coverage who spoke with visitor. Will continue to monitor at this time

## 2017-04-08 NOTE — Progress Notes (Signed)
Pt refuses to wear CO2 detector cannular and pulse ox. Does not like to hear it alarm. Explained if not waering that he cannot get medication per protocol. Pt will wear the above at scheduled med assessement time when asked and then removes as nurse leaves room. Will continue to monitor

## 2017-04-08 NOTE — Progress Notes (Signed)
Patient ID: [name] Pick., male   DOB: 07/27/1980, 37 y.o.   MRN: [id number] Subjective:   Patient was admitted yesterday for Colitis diagnosed with CT abdomen, no new symptom today, he insists he wants to start eating, he could no longer stay NPO. He said abd pain is better, no nausea, no vomiting, no diarrhea. No abdominal distension. No fever.   Objective:  Vital signs in last 24 hours:  Vitals:   04/07/17 1845 04/07/17 1901 04/07/17 2211 04/08/17 0518  BP:  (!) 96/45 104/60 (!) 100/56  Pulse:  (!) 101 99 (!) 112  Resp:  16 16 16   Temp:   98.9 F (37.2 C) 98.7 F (37.1 C)  TempSrc:   Oral Oral  SpO2: 97% 98% 100% 94%  Weight:      Height:       Intake/Output from previous day:  Intake/Output Summary (Last 24 hours) at 04/08/2017 0941 Last data filed at 04/08/2017 0831 Gross per 24 hour  Intake 1389.58 ml  Output 1050 ml  Net 339.58 ml    Physical Exam: General: Alert, awake, oriented x3, in no acute distress.  HEENT: [place]/AT PEERL, EOMI Neck: Trachea midline,  no masses, no thyromegal,y no JVD, no carotid bruit OROPHARYNX:  Moist, No exudate/ erythema/lesions.  Heart: Regular rate and rhythm, without murmurs, rubs, gallops, PMI non-displaced, no heaves or thrills on palpation.  Lungs: Clear to auscultation, no wheezing or rhonchi noted. No increased vocal fremitus resonant to percussion  Abdomen: Soft, mild generalized tenderness but no guarding, nondistended, positive bowel sounds, no masses no hepatosplenomegaly noted..  Neuro: No focal neurological deficits noted cranial nerves II through XII grossly intact. DTRs 2+ bilaterally upper and lower extremities. Strength 5 out of 5 in bilateral upper and lower extremities. Musculoskeletal: No warm swelling or erythema around joints, no spinal tenderness noted. Psychiatric: Patient alert and oriented x3, good insight and cognition, good recent to remote recall. Lymph node survey: No cervical axillary or inguinal  lymphadenopathy noted.  Basic Metabolic Panel:    Component Value Date/Time   NA 134 (L) 04/08/2017 0635   K 4.1 04/08/2017 0635   CL 104 04/08/2017 0635   CO2 22 04/08/2017 0635   BUN 20 04/08/2017 0635   CREATININE 2.16 (H) 04/08/2017 0635   CREATININE 1.45 (H) 05/12/2014 1430   GLUCOSE 99 04/08/2017 0635   CALCIUM 7.1 (L) 04/08/2017 0635   CBC:    Component Value Date/Time   WBC 19.9 (H) 04/08/2017 0847   HGB 6.0 (LL) 04/08/2017 0847   HCT 17.5 (L) 04/08/2017 0847   HCT 23.5 (L) 10/15/2016 0013   PLT PENDING 04/08/2017 0847   MCV 97.2 04/08/2017 0847   NEUTROABS PENDING 04/08/2017 0847   LYMPHSABS PENDING 04/08/2017 0847   MONOABS PENDING 04/08/2017 0847   EOSABS PENDING 04/08/2017 0847   BASOSABS PENDING 04/08/2017 0847    No results found for this or any previous visit (from the past 240 hour(s)).  Studies/Results: Dg Chest Port 1 View  Result Date: 04/07/2017 CLINICAL DATA:  Sickle cell pain crisis.  Right flank pain. EXAM: PORTABLE CHEST 1 VIEW COMPARISON:  02/27/2017 FINDINGS: The heart is normal in size allowing for AP technique and low lung volumes. Lungs are under aerated with bibasilar atelectasis. No pneumothorax or pleural effusion. There are sclerotic changes and articular surface collapse involving the right humeral head consistent with avascular necrosis and bone infarct. IMPRESSION: Bibasilar atelectasis. Bony changes related to sickle cell disorder and bone infarct. Electronically Signed  By: Jolaine Click M.D.   On: 04/07/2017 14:03   Ct Renal Stone Study  Result Date: 04/07/2017 CLINICAL DATA:  Right-sided flank pain beginning today. Sickle cell disease. EXAM: CT ABDOMEN AND PELVIS WITHOUT CONTRAST TECHNIQUE: Multidetector CT imaging of the abdomen and pelvis was performed following the standard protocol without IV contrast. COMPARISON:  None. FINDINGS: Lower chest: Bibasilar scarring. Asymmetric opacity in left lung base may be due to atelectasis or  infiltrate. Hepatobiliary: No mass visualized on this unenhanced exam. Prior cholecystectomy. No evidence of biliary obstruction. Pancreas: No mass or inflammatory process visualized on this unenhanced exam. Spleen: Tiny calcified spleen, consistent with auto splenectomy of sickle cell disease. Adrenals/Urinary tract: Bilateral renal parenchymal atrophy. No evidence of urolithiasis or hydronephrosis. Unremarkable unopacified urinary bladder. Stomach/Bowel: Mild to moderate wall thickening is seen involving the ascending and transverse colon, consistent with colitis. No evidence of bowel obstruction or abscess. Mild ascites noted. Vascular/Lymphatic: No pathologically enlarged lymph nodes identified. No evidence of abdominal aortic aneurysm. Reproductive:  No mass or other significant abnormality. Other:  None. Musculoskeletal: No suspicious bone lesions identified. Diffuse osteosclerosis, consistent with sickle cell disease. IMPRESSION: Colitis involving the ascending and transverse colon.  Mild ascites. No evidence of urolithiasis or hydronephrosis. Mild atelectasis or scarring in left lung base. Diffuse osteosclerosis and auto-splenectomy, consistent with chronic sickle cell disease. Electronically Signed   By: Myles Rosenthal M.D.   On: 04/07/2017 15:56    Medications: Scheduled Meds: . folic acid  1 mg Oral Daily  . HYDROmorphone   Intravenous Q4H  . sodium bicarbonate  650 mg Oral TID   Continuous Infusions: . dextrose 5 % and 0.45% NaCl    . piperacillin-tazobactam (ZOSYN)  IV Stopped (04/08/17 0858)   PRN Meds:.diphenhydrAMINE, HYDROmorphone (DILAUDID) injection, naloxone **AND** sodium chloride flush  Assessment/Plan: Principal Problem:   Colitis Active Problems:   Sickle cell disease, type SS.    CHRONIC KIDNEY DISEASE STAGE II (MILD)   Leucocytosis   Anemia   Sickle cell nephropathy (HCC)   Hyponatremia  1. Colitis: Patient presented with right flank pain, diarrhea and vomiting. CT  imaging was suggestive of colitis in the ascending and descending colon. Patient also has leukocytosis. Patient is currently on antibiotics, Zosyn. Will continue. Patient desires to start eating, he is very adamant on eating and drinking something.   2. Leukocytosis: We will continue IV antibiotics. We will follow-up blood cultures. Patient has elevated white cell counts on baseline.  3. Sickle cell disease: Continue pain management. Continue IV fluids, IV Dilaudid via PCA and other adjunct therapies. Patient is unable to receive Toradol due to CKD  4. Chronic kidney disease stage III: Secondary to sickle cell nephropathy. He follows up with nephrology as an outpatient. His baseline creatinine ranges from 1.6- 1.8. Kidney function is on baseline. We will continue to monitor. Patient is also on sodium bicarb at home.  5. Hyponatremia: Continue IV fluids.  6. Elevated liver enzymes: Elevated bilirubin and liver enzymes on baseline.  We will continue to monitor.  Code Status: Full Code Family Communication: N/A Disposition Plan: Not yet ready for discharge  Johnnette Laux  If 7PM-7AM, please contact night-coverage.  04/08/2017, 9:41 AM  LOS: 1 day

## 2017-04-08 NOTE — Progress Notes (Signed)
PCA Dilaudid pump has been turned off due to pt not wearing pulse ox or CO2 sensor despite repeated explanations that it is protocol to be monitored if PCA is to be used, pt is rolling in bed itching and requiring me to ask same ? More than once to get an answer. States he feels medicated if I ask if he feels medicated as to why he is acting the way he is. States he just wants to sleep

## 2017-04-09 DIAGNOSIS — N183 Chronic kidney disease, stage 3 (moderate): Secondary | ICD-10-CM

## 2017-04-09 LAB — COMPREHENSIVE METABOLIC PANEL
ALK PHOS: 184 U/L — AB (ref 38–126)
ALT: 35 U/L (ref 17–63)
AST: 73 U/L — AB (ref 15–41)
Albumin: 1.3 g/dL — ABNORMAL LOW (ref 3.5–5.0)
Anion gap: 7 (ref 5–15)
BILIRUBIN TOTAL: 2.1 mg/dL — AB (ref 0.3–1.2)
BUN: 17 mg/dL (ref 6–20)
CALCIUM: 7.4 mg/dL — AB (ref 8.9–10.3)
CO2: 19 mmol/L — ABNORMAL LOW (ref 22–32)
CREATININE: 2.81 mg/dL — AB (ref 0.61–1.24)
Chloride: 108 mmol/L (ref 101–111)
GFR calc Af Amer: 32 mL/min — ABNORMAL LOW (ref >60)
GFR, EST NON AFRICAN AMERICAN: 28 mL/min — AB (ref >60)
GLUCOSE: 105 mg/dL — AB (ref 65–99)
Potassium: 4.3 mmol/L (ref 3.5–5.1)
Sodium: 134 mmol/L — ABNORMAL LOW (ref 135–145)
TOTAL PROTEIN: 4.5 g/dL — AB (ref 6.5–8.1)

## 2017-04-09 LAB — CBC WITH DIFFERENTIAL/PLATELET
BASOS PCT: 0 %
Basophils Absolute: 0 10*3/uL (ref 0.0–0.1)
EOS PCT: 3 %
Eosinophils Absolute: 0.5 10*3/uL (ref 0.0–0.7)
HEMATOCRIT: 16 % — AB (ref 39.0–52.0)
HEMOGLOBIN: 5.7 g/dL — AB (ref 13.0–17.0)
Lymphocytes Relative: 35 %
Lymphs Abs: 5.8 10*3/uL — ABNORMAL HIGH (ref 0.7–4.0)
MCH: 35.8 pg — ABNORMAL HIGH (ref 26.0–34.0)
MCHC: 35.6 g/dL (ref 30.0–36.0)
MCV: 100.6 fL — AB (ref 78.0–100.0)
MONOS PCT: 11 %
Monocytes Absolute: 1.8 10*3/uL — ABNORMAL HIGH (ref 0.1–1.0)
NEUTROS ABS: 8.6 10*3/uL — AB (ref 1.7–7.7)
Neutrophils Relative %: 51 %
Platelets: 159 10*3/uL (ref 150–400)
RBC: 1.59 MIL/uL — ABNORMAL LOW (ref 4.22–5.81)
RDW: 29.1 % — ABNORMAL HIGH (ref 11.5–15.5)
WBC: 16.7 10*3/uL — AB (ref 4.0–10.5)
nRBC: 2 /100 WBC — ABNORMAL HIGH

## 2017-04-09 NOTE — Progress Notes (Signed)
CRITICAL VALUE ALERT  Critical Value: Hgb = 5.7  Was 6.0 yesterday   Date & Time Notied:  9741 on 04/09/17  Provider Notified:via AMION text   Orders Received/Actions taken:pending

## 2017-04-09 NOTE — Progress Notes (Addendum)
PCA dilaudid has been off since 2145 last pm -- 7.26ml used with 50ml remaining in the syringe Pt reports he is ready to go home - denies pain , has not had any diarrhea in >24hrs.

## 2017-04-12 ENCOUNTER — Other Ambulatory Visit: Payer: Self-pay | Admitting: Internal Medicine

## 2017-04-13 LAB — CULTURE, BLOOD (ROUTINE X 2)
Culture: NO GROWTH
Special Requests: ADEQUATE

## 2017-04-19 ENCOUNTER — Inpatient Hospital Stay (HOSPITAL_COMMUNITY)
Admission: EM | Admit: 2017-04-19 | Discharge: 2017-05-03 | DRG: 811 | Disposition: A | Discharge status: Home / Self Care | Payer: Medicare Other | Attending: Internal Medicine | Admitting: Internal Medicine

## 2017-04-19 ENCOUNTER — Other Ambulatory Visit: Payer: Self-pay

## 2017-04-19 ENCOUNTER — Encounter (HOSPITAL_COMMUNITY): Payer: Self-pay | Admitting: Emergency Medicine

## 2017-04-19 ENCOUNTER — Emergency Department (HOSPITAL_COMMUNITY): Payer: Medicare Other

## 2017-04-19 DIAGNOSIS — D631 Anemia in chronic kidney disease: Secondary | ICD-10-CM | POA: Diagnosis present

## 2017-04-19 DIAGNOSIS — R188 Other ascites: Secondary | ICD-10-CM

## 2017-04-19 DIAGNOSIS — N170 Acute kidney failure with tubular necrosis: Secondary | ICD-10-CM | POA: Diagnosis not present

## 2017-04-19 DIAGNOSIS — R1032 Left lower quadrant pain: Secondary | ICD-10-CM

## 2017-04-19 DIAGNOSIS — Z9111 Patient's noncompliance with dietary regimen: Secondary | ICD-10-CM

## 2017-04-19 DIAGNOSIS — R935 Abnormal findings on diagnostic imaging of other abdominal regions, including retroperitoneum: Secondary | ICD-10-CM

## 2017-04-19 DIAGNOSIS — G894 Chronic pain syndrome: Secondary | ICD-10-CM | POA: Diagnosis present

## 2017-04-19 DIAGNOSIS — E876 Hypokalemia: Secondary | ICD-10-CM | POA: Diagnosis not present

## 2017-04-19 DIAGNOSIS — R197 Diarrhea, unspecified: Secondary | ICD-10-CM

## 2017-04-19 DIAGNOSIS — L299 Pruritus, unspecified: Secondary | ICD-10-CM | POA: Diagnosis not present

## 2017-04-19 DIAGNOSIS — K559 Vascular disorder of intestine, unspecified: Secondary | ICD-10-CM | POA: Diagnosis not present

## 2017-04-19 DIAGNOSIS — R1031 Right lower quadrant pain: Secondary | ICD-10-CM | POA: Diagnosis not present

## 2017-04-19 DIAGNOSIS — N184 Chronic kidney disease, stage 4 (severe): Secondary | ICD-10-CM | POA: Diagnosis present

## 2017-04-19 DIAGNOSIS — E46 Unspecified protein-calorie malnutrition: Secondary | ICD-10-CM | POA: Diagnosis not present

## 2017-04-19 DIAGNOSIS — F17210 Nicotine dependence, cigarettes, uncomplicated: Secondary | ICD-10-CM | POA: Diagnosis present

## 2017-04-19 DIAGNOSIS — R42 Dizziness and giddiness: Secondary | ICD-10-CM | POA: Diagnosis not present

## 2017-04-19 DIAGNOSIS — D696 Thrombocytopenia, unspecified: Secondary | ICD-10-CM | POA: Diagnosis present

## 2017-04-19 DIAGNOSIS — R10811 Right upper quadrant abdominal tenderness: Secondary | ICD-10-CM

## 2017-04-19 DIAGNOSIS — R112 Nausea with vomiting, unspecified: Secondary | ICD-10-CM | POA: Diagnosis present

## 2017-04-19 DIAGNOSIS — R109 Unspecified abdominal pain: Secondary | ICD-10-CM | POA: Diagnosis not present

## 2017-04-19 DIAGNOSIS — N049 Nephrotic syndrome with unspecified morphologic changes: Secondary | ICD-10-CM

## 2017-04-19 DIAGNOSIS — R1011 Right upper quadrant pain: Secondary | ICD-10-CM

## 2017-04-19 DIAGNOSIS — N189 Chronic kidney disease, unspecified: Secondary | ICD-10-CM

## 2017-04-19 DIAGNOSIS — Z886 Allergy status to analgesic agent status: Secondary | ICD-10-CM

## 2017-04-19 DIAGNOSIS — N5089 Other specified disorders of the male genital organs: Secondary | ICD-10-CM | POA: Diagnosis not present

## 2017-04-19 DIAGNOSIS — K529 Noninfective gastroenteritis and colitis, unspecified: Secondary | ICD-10-CM | POA: Diagnosis present

## 2017-04-19 DIAGNOSIS — N179 Acute kidney failure, unspecified: Secondary | ICD-10-CM

## 2017-04-19 DIAGNOSIS — Z885 Allergy status to narcotic agent status: Secondary | ICD-10-CM

## 2017-04-19 DIAGNOSIS — N058 Unspecified nephritic syndrome with other morphologic changes: Secondary | ICD-10-CM | POA: Diagnosis present

## 2017-04-19 DIAGNOSIS — K760 Fatty (change of) liver, not elsewhere classified: Secondary | ICD-10-CM | POA: Diagnosis present

## 2017-04-19 DIAGNOSIS — Z9081 Acquired absence of spleen: Secondary | ICD-10-CM

## 2017-04-19 DIAGNOSIS — D5700 Hb-SS disease with crisis, unspecified: Secondary | ICD-10-CM | POA: Diagnosis not present

## 2017-04-19 DIAGNOSIS — E872 Acidosis: Secondary | ICD-10-CM | POA: Diagnosis present

## 2017-04-19 DIAGNOSIS — N183 Chronic kidney disease, stage 3 (moderate): Secondary | ICD-10-CM | POA: Diagnosis present

## 2017-04-19 DIAGNOSIS — R339 Retention of urine, unspecified: Secondary | ICD-10-CM | POA: Diagnosis present

## 2017-04-19 DIAGNOSIS — Z79899 Other long term (current) drug therapy: Secondary | ICD-10-CM

## 2017-04-19 DIAGNOSIS — R000 Tachycardia, unspecified: Secondary | ICD-10-CM | POA: Diagnosis not present

## 2017-04-19 DIAGNOSIS — T402X5A Adverse effect of other opioids, initial encounter: Secondary | ICD-10-CM | POA: Diagnosis present

## 2017-04-19 DIAGNOSIS — D638 Anemia in other chronic diseases classified elsewhere: Secondary | ICD-10-CM | POA: Diagnosis present

## 2017-04-19 DIAGNOSIS — Z452 Encounter for adjustment and management of vascular access device: Secondary | ICD-10-CM

## 2017-04-19 DIAGNOSIS — E8809 Other disorders of plasma-protein metabolism, not elsewhere classified: Secondary | ICD-10-CM | POA: Diagnosis not present

## 2017-04-19 DIAGNOSIS — Z6821 Body mass index (BMI) 21.0-21.9, adult: Secondary | ICD-10-CM

## 2017-04-19 DIAGNOSIS — K219 Gastro-esophageal reflux disease without esophagitis: Secondary | ICD-10-CM | POA: Diagnosis present

## 2017-04-19 HISTORY — DX: Noninfective gastroenteritis and colitis, unspecified: K52.9

## 2017-04-19 HISTORY — DX: Unspecified nephritic syndrome with other morphologic changes: N05.8

## 2017-04-19 LAB — CBC WITH DIFFERENTIAL/PLATELET
Basophils Absolute: 0 K/uL (ref 0.0–0.1)
Basophils Relative: 0 %
Eosinophils Absolute: 0.2 K/uL (ref 0.0–0.7)
Eosinophils Relative: 1 %
HCT: 15.6 % — ABNORMAL LOW (ref 39.0–52.0)
Hemoglobin: 5.5 g/dL — CL (ref 13.0–17.0)
Lymphocytes Relative: 43 %
Lymphs Abs: 7.7 K/uL — ABNORMAL HIGH (ref 0.7–4.0)
MCH: 35.9 pg — ABNORMAL HIGH (ref 26.0–34.0)
MCHC: 35.3 g/dL (ref 30.0–36.0)
MCV: 102 fL — ABNORMAL HIGH (ref 78.0–100.0)
Monocytes Absolute: 1.8 K/uL — ABNORMAL HIGH (ref 0.1–1.0)
Monocytes Relative: 10 %
Neutro Abs: 8.3 K/uL — ABNORMAL HIGH (ref 1.7–7.7)
Neutrophils Relative %: 46 %
Platelets: 140 K/uL — ABNORMAL LOW (ref 150–400)
RBC: 1.53 MIL/uL — ABNORMAL LOW (ref 4.22–5.81)
RDW: 29.2 % — ABNORMAL HIGH (ref 11.5–15.5)
WBC: 18 K/uL — ABNORMAL HIGH (ref 4.0–10.5)
nRBC: 1 /100{WBCs} — ABNORMAL HIGH

## 2017-04-19 LAB — COMPREHENSIVE METABOLIC PANEL
ALBUMIN: 1.3 g/dL — AB (ref 3.5–5.0)
ALT: 35 U/L (ref 17–63)
AST: 80 U/L — AB (ref 15–41)
Alkaline Phosphatase: 197 U/L — ABNORMAL HIGH (ref 38–126)
Anion gap: 8 (ref 5–15)
BUN: 18 mg/dL (ref 6–20)
CHLORIDE: 108 mmol/L (ref 101–111)
CO2: 25 mmol/L (ref 22–32)
Calcium: 7 mg/dL — ABNORMAL LOW (ref 8.9–10.3)
Creatinine, Ser: 2.38 mg/dL — ABNORMAL HIGH (ref 0.61–1.24)
GFR calc Af Amer: 39 mL/min — ABNORMAL LOW (ref >60)
GFR calc non Af Amer: 34 mL/min — ABNORMAL LOW (ref >60)
GLUCOSE: 105 mg/dL — AB (ref 65–99)
POTASSIUM: 3.6 mmol/L (ref 3.5–5.1)
Sodium: 141 mmol/L (ref 135–145)
Total Bilirubin: 2.2 mg/dL — ABNORMAL HIGH (ref 0.3–1.2)
Total Protein: 4.4 g/dL — ABNORMAL LOW (ref 6.5–8.1)

## 2017-04-19 LAB — PREPARE RBC (CROSSMATCH)

## 2017-04-19 MED ORDER — CIPROFLOXACIN IN D5W 400 MG/200ML IV SOLN
400.0000 mg | Freq: Once | INTRAVENOUS | Status: AC
  Administered 2017-04-19: 400 mg via INTRAVENOUS
  Filled 2017-04-19: qty 200

## 2017-04-19 MED ORDER — HYDROMORPHONE HCL 1 MG/ML IJ SOLN
1.0000 mg | Freq: Once | INTRAMUSCULAR | Status: AC
  Administered 2017-04-19: 1 mg via INTRAVENOUS
  Filled 2017-04-19: qty 1

## 2017-04-19 MED ORDER — SODIUM CHLORIDE 0.9 % IV BOLUS (SEPSIS)
1000.0000 mL | Freq: Once | INTRAVENOUS | Status: AC
  Administered 2017-04-19: 1000 mL via INTRAVENOUS

## 2017-04-19 MED ORDER — DIPHENHYDRAMINE HCL 50 MG/ML IJ SOLN
25.0000 mg | Freq: Once | INTRAMUSCULAR | Status: AC
  Administered 2017-04-19: 25 mg via INTRAVENOUS
  Filled 2017-04-19: qty 1

## 2017-04-19 MED ORDER — SODIUM CHLORIDE 0.9 % IV SOLN: 10.0000 mL/h | Freq: Once | INTRAVENOUS | Status: DC

## 2017-04-19 NOTE — ED Notes (Signed)
U/A per MD order. Pt states unable to urinate at this time. Call bell urinal and labeled specimen cup at bedside; encouraged pt to call for assistance as needed. [employer]

## 2017-04-19 NOTE — ED Notes (Addendum)
Date and time results received: 04/19/17 9:20 PM   Test: Hemoglobin Critical Value: 5.5  Name of Provider Notified: Roderic Palau, MD and primary RN   Orders Received? Or Actions Taken?:

## 2017-04-19 NOTE — ED Provider Notes (Addendum)
[facility] DEPT Provider Note   CSN: [id number] Arrival date & time: 04/19/17  2000     History   Chief Complaint Chief Complaint  Patient presents with  . Sickle Cell Pain Crisis  . Abdominal Pain    HPI [name] [name]. is a 38 y.o. male.  Patient complains of abdominal discomfort and diarrhea.  He was discharged from the hospital with colitis on February 25.  He states he is not improving and he feels weak and has been seeing some blood in his stool   The history is provided by the patient. No language interpreter was used.  Illness  This is a recurrent problem. The current episode started more than 2 days ago. The problem occurs constantly. The problem has not changed since onset.Associated symptoms include abdominal pain. Pertinent negatives include no chest pain and no headaches. Nothing aggravates the symptoms.    Past Medical History:  Diagnosis Date  . Bacterial pneumonia ~ 2012   "caught it here in the hospital" (09/25/2012)  . Chronic kidney disease    "from my sickle cell" (09/25/2012)  . CKD (chronic kidney disease), stage II   . Cyst of brain    "2 really small ones in the back of my head; inside; saw them w/MRI" (09/25/2012)  . GERD (gastroesophageal reflux disease)    "after I eat alot of spicey foods" (09/25/2012)  . Gynecomastia, male 07/10/2012  . History of blood transfusion    "always related to sickle cell crisis" (09/25/2012)  . Migraines    "take RX qd to prevent them" (09/25/2012)  . Sickle cell anemia (HCC)   . Sickle cell crisis ([place]) 09/25/2012  . Sickle cell nephropathy ([place]) 07/10/2012  . Sinus tachycardia   . Tachycardia with heart rate 121-140 beats per minute with ambulation 08/04/2016    Patient Active Problem List   Diagnosis Date Noted  . Colitis 04/07/2017  . Abnormal liver function   . HCAP (healthcare-associated pneumonia)   . QT prolongation   . Pneumonia 02/27/2017  . Elevated troponin  02/27/2017  . Diarrhea 02/27/2017  . Soft tissue swelling of chest wall 12/18/2016  . Hypoxia   . Acute kidney injury superimposed on chronic kidney disease ([place]) 12/13/2016  . Vasoocclusive sickle cell crisis ([place]) 12/13/2016  . Sickle cell crisis ([place]) 10/14/2016  . Hyponatremia 10/14/2016  . Tachycardia with heart rate 121-140 beats per minute with ambulation 08/04/2016  . Metabolic acidosis 30/86/5784  . Leucocytosis 08/02/2016  . Anemia 08/02/2016  . Macrocytosis due to Hydroxyurea 08/02/2016  . Sickle cell pain crisis ([place]) 01/24/2013  . Chronic, continuous use of opioids 08/30/2012  . Chronic headaches 07/10/2012  . Gynecomastia, male 07/10/2012  . Sickle cell nephropathy ([place]) 07/10/2012  . Tachycardia 12/08/2011  . Systolic murmur 69/62/9528  . SICKLE CELL CRISIS 01/04/2010  . Migraine 11/26/2009  . CHRONIC KIDNEY DISEASE STAGE II (MILD) 03/06/2009  . Sickle cell disease, type SS.  06/18/2008  . TOBACCO ABUSE 05/22/2007    Past Surgical History:  Procedure Laterality Date  . CHOLECYSTECTOMY  ~ 2012  . IR FLUORO GUIDE CV LINE RIGHT  12/17/2016  . IR REMOVAL TUN CV CATH W/O FL  12/21/2016  . IR US GUIDE VASC ACCESS RIGHT  12/17/2016  . spleenectomy         Home Medications    Prior to Admission medications   Medication Sig Start Date End Date Taking? Authorizing Provider  enalapril (VASOTEC) 5 MG tablet Take 5 mg  by mouth daily. 12/09/16  Yes [provider]  folic acid (FOLVITE) 1 MG tablet Take 1 tablet (1 mg total) daily by mouth. 12/22/16  Yes Leana Gamer, MD  hydroxyurea (HYDREA) 500 MG capsule TAKE 3 CAPSULES (1,500 MG TOTAL) BY MOUTH DAILY. Hold taking this medication until discussed with Dr. Alyson Ingles as patient is not taking regularly and the dose which he is prescribed when taken regularly has demonstrated bone marrow supression 12/21/16  Yes Leana Gamer, MD  nortriptyline (PAMELOR) 25 MG capsule TAKE 1 CAPSULE (25 MG TOTAL) BY MOUTH  AT BEDTIME. 01/18/17  Yes Donnamae Jude, MD  Oxycodone HCl 10 MG TABS Take 1 tablet (10 mg total) by mouth every 6 (six) hours as needed. 03/04/17  Yes [name], MD  sodium bicarbonate 650 MG tablet Take 1 tablet (650 mg total) 3 (three) times daily by mouth. 12/21/16  Yes Leana Gamer, MD  Menthol-Methyl Salicylate (MUSCLE RUB) 10-15 % CREA Apply 1 application as needed topically for muscle pain. Patient not taking: Reported on 04/19/2017 12/21/16   Leana Gamer, MD    Family History Family History  Problem Relation Age of Onset  . Breast cancer Mother     Social History Social History   Tobacco Use  . Smoking status: Current Some Day Smoker    Types: Cigarettes  . Smokeless tobacco: Never Used  . Tobacco comment: 09/25/2012 "I don't buy cigarettes; bum one from friends q now and then"  Substance Use Topics  . Alcohol use: No    Alcohol/week: 0.0 oz    Frequency: Never  . Drug use: No    Comment: 09/25/2012 "weed was my biggest problem; stopped that ~ 1 1/2 months ago; I don't do cocaine; can't say I never have though"     Allergies   Nsaids and Morphine and related   Review of Systems Review of Systems  Constitutional: Negative for appetite change and fatigue.  HENT: Negative for congestion, ear discharge and sinus pressure.   Eyes: Negative for discharge.  Respiratory: Negative for cough.   Cardiovascular: Negative for chest pain.  Gastrointestinal: Positive for abdominal pain. Negative for diarrhea.  Genitourinary: Negative for frequency and hematuria.  Musculoskeletal: Negative for back pain.  Skin: Negative for rash.  Neurological: Negative for seizures and headaches.  Psychiatric/Behavioral: Negative for hallucinations.     Physical Exam Updated Vital Signs BP 95/73   Pulse (!) 105   Temp 98.2 F (36.8 C) (Oral)   Resp 15   SpO2 97%   Physical Exam  Constitutional: He is oriented to person, place, and time. He appears  well-developed.  HENT:  Head: Normocephalic.  Eyes: Conjunctivae and EOM are normal. No scleral icterus.  Neck: Neck supple. No thyromegaly present.  Cardiovascular: Normal rate and regular rhythm. Exam reveals no gallop and no friction rub.  No murmur heard. Pulmonary/Chest: No stridor. He has no wheezes. He has no rales. He exhibits no tenderness.  Abdominal: He exhibits no distension. There is tenderness. There is no rebound.  Genitourinary:  Genitourinary Comments: Rectal heme-negative  Musculoskeletal: Normal range of motion. He exhibits no edema.  Lymphadenopathy:    He has no cervical adenopathy.  Neurological: He is oriented to person, place, and time. He exhibits normal muscle tone. Coordination normal.  Skin: No rash noted. No erythema.  Psychiatric: He has a normal mood and affect. His behavior is normal.     ED Treatments / Results  Labs (all labs ordered are  listed, but only abnormal results are displayed) Labs Reviewed  CBC WITH DIFFERENTIAL/PLATELET - Abnormal; Notable for the following components:      Result Value   WBC 18.0 (*)    RBC 1.53 (*)    Hemoglobin 5.5 (*)    HCT 15.6 (*)    MCV 102.0 (*)    MCH 35.9 (*)    RDW 29.2 (*)    Platelets 140 (*)    nRBC 1 (*)    Neutro Abs 8.3 (*)    Lymphs Abs 7.7 (*)    Monocytes Absolute 1.8 (*)    All other components within normal limits  COMPREHENSIVE METABOLIC PANEL - Abnormal; Notable for the following components:   Glucose, Bld 105 (*)    Creatinine, Ser 2.38 (*)    Calcium 7.0 (*)    Total Protein 4.4 (*)    Albumin 1.3 (*)    AST 80 (*)    Alkaline Phosphatase 197 (*)    Total Bilirubin 2.2 (*)    GFR calc non Af Amer 34 (*)    GFR calc Af Amer 39 (*)    All other components within normal limits  C DIFFICILE QUICK SCREEN W PCR REFLEX  URINALYSIS, ROUTINE W REFLEX MICROSCOPIC  PREPARE RBC (CROSSMATCH)  TYPE AND SCREEN    EKG  EKG Interpretation None       Radiology No results  found.  Procedures Procedures (including critical care time)  Medications Ordered in ED Medications  0.9 %  sodium chloride infusion (not administered)  ciprofloxacin (CIPRO) IVPB 400 mg (400 mg Intravenous New Bag/Given 04/19/17 2250)  sodium chloride 0.9 % bolus 1,000 mL (0 mLs Intravenous Stopped 04/19/17 2217)  HYDROmorphone (DILAUDID) injection 1 mg (1 mg Intravenous Given 04/19/17 2045)  diphenhydrAMINE (BENADRYL) injection 25 mg (25 mg Intravenous Given 04/19/17 2045)  HYDROmorphone (DILAUDID) injection 1 mg (1 mg Intravenous Given 04/19/17 2137)  sodium chloride 0.9 % bolus 1,000 mL (1,000 mLs Intravenous New Bag/Given 04/19/17 2250)   CRITICAL CARE Performed by: Milton Ferguson Total critical care time 35 minutes Critical care time was exclusive of separately billable procedures and treating other patients. Critical care was necessary to treat or prevent imminent or life-threatening deterioration. Critical care was time spent personally by me on the following activities: development of treatment plan with patient and/or surrogate as well as nursing, discussions with consultants, evaluation of patient's response to treatment, examination of patient, obtaining history from patient or surrogate, ordering and performing treatments and interventions, ordering and review of laboratory studies, ordering and review of radiographic studies, pulse oximetry and re-evaluation of patient's condition.   Initial Impression / Assessment and Plan / ED Course  I have reviewed the triage vital signs and the nursing notes.  Pertinent labs & imaging results that were available during my care of the patient were reviewed by me and considered in my medical decision making (see chart for details).    Patient with continued abdominal pain diarrhea history of colitis and anemia.  He will be admitted to medicine.  Patient stated that he does want to be transfused.  Final Clinical Impressions(s) / ED Diagnoses    Final diagnoses:  Right lower quadrant abdominal pain    ED Discharge Orders    None       Milton Ferguson, MD 04/19/17 6384    Milton Ferguson, MD 04/27/17 1309

## 2017-04-19 NOTE — ED Triage Notes (Signed)
Pt states he has sickle cell pain in his chest and is having abd pain  Pt states last time he was here in Feb he was diagnosed with colitis  Pt states he has had vomiting and diarrhea today  Pt states his abdomen is bloated  Pt is c/o pain also in the right side of his back/flank area  Pt states pain started early this morning and has gotten progressively worse throughout the day  Pt states his chest pain started this evening

## 2017-04-20 ENCOUNTER — Encounter (HOSPITAL_COMMUNITY): Payer: Self-pay | Admitting: Internal Medicine

## 2017-04-20 DIAGNOSIS — N183 Chronic kidney disease, stage 3 (moderate): Secondary | ICD-10-CM | POA: Diagnosis not present

## 2017-04-20 DIAGNOSIS — R197 Diarrhea, unspecified: Secondary | ICD-10-CM | POA: Diagnosis not present

## 2017-04-20 DIAGNOSIS — N049 Nephrotic syndrome with unspecified morphologic changes: Secondary | ICD-10-CM | POA: Diagnosis not present

## 2017-04-20 DIAGNOSIS — N5089 Other specified disorders of the male genital organs: Secondary | ICD-10-CM | POA: Diagnosis not present

## 2017-04-20 DIAGNOSIS — R339 Retention of urine, unspecified: Secondary | ICD-10-CM | POA: Diagnosis present

## 2017-04-20 DIAGNOSIS — R112 Nausea with vomiting, unspecified: Secondary | ICD-10-CM | POA: Diagnosis present

## 2017-04-20 DIAGNOSIS — D638 Anemia in other chronic diseases classified elsewhere: Secondary | ICD-10-CM | POA: Diagnosis present

## 2017-04-20 DIAGNOSIS — D5700 Hb-SS disease with crisis, unspecified: Secondary | ICD-10-CM | POA: Diagnosis not present

## 2017-04-20 DIAGNOSIS — K559 Vascular disorder of intestine, unspecified: Secondary | ICD-10-CM | POA: Diagnosis not present

## 2017-04-20 DIAGNOSIS — N182 Chronic kidney disease, stage 2 (mild): Secondary | ICD-10-CM | POA: Diagnosis not present

## 2017-04-20 DIAGNOSIS — T402X5A Adverse effect of other opioids, initial encounter: Secondary | ICD-10-CM | POA: Diagnosis present

## 2017-04-20 DIAGNOSIS — R945 Abnormal results of liver function studies: Secondary | ICD-10-CM | POA: Diagnosis not present

## 2017-04-20 DIAGNOSIS — L299 Pruritus, unspecified: Secondary | ICD-10-CM | POA: Diagnosis not present

## 2017-04-20 DIAGNOSIS — N170 Acute kidney failure with tubular necrosis: Secondary | ICD-10-CM | POA: Diagnosis not present

## 2017-04-20 DIAGNOSIS — N179 Acute kidney failure, unspecified: Secondary | ICD-10-CM | POA: Diagnosis not present

## 2017-04-20 DIAGNOSIS — D696 Thrombocytopenia, unspecified: Secondary | ICD-10-CM | POA: Diagnosis not present

## 2017-04-20 DIAGNOSIS — R1032 Left lower quadrant pain: Secondary | ICD-10-CM | POA: Diagnosis not present

## 2017-04-20 DIAGNOSIS — E876 Hypokalemia: Secondary | ICD-10-CM | POA: Diagnosis not present

## 2017-04-20 DIAGNOSIS — K760 Fatty (change of) liver, not elsewhere classified: Secondary | ICD-10-CM | POA: Diagnosis not present

## 2017-04-20 DIAGNOSIS — Z452 Encounter for adjustment and management of vascular access device: Secondary | ICD-10-CM | POA: Diagnosis not present

## 2017-04-20 DIAGNOSIS — R188 Other ascites: Secondary | ICD-10-CM | POA: Diagnosis not present

## 2017-04-20 DIAGNOSIS — Z9081 Acquired absence of spleen: Secondary | ICD-10-CM | POA: Diagnosis not present

## 2017-04-20 DIAGNOSIS — E871 Hypo-osmolality and hyponatremia: Secondary | ICD-10-CM | POA: Diagnosis not present

## 2017-04-20 DIAGNOSIS — Z6821 Body mass index (BMI) 21.0-21.9, adult: Secondary | ICD-10-CM | POA: Diagnosis not present

## 2017-04-20 DIAGNOSIS — R011 Cardiac murmur, unspecified: Secondary | ICD-10-CM | POA: Diagnosis not present

## 2017-04-20 DIAGNOSIS — F17210 Nicotine dependence, cigarettes, uncomplicated: Secondary | ICD-10-CM | POA: Diagnosis not present

## 2017-04-20 DIAGNOSIS — J9811 Atelectasis: Secondary | ICD-10-CM | POA: Diagnosis not present

## 2017-04-20 DIAGNOSIS — D72829 Elevated white blood cell count, unspecified: Secondary | ICD-10-CM | POA: Diagnosis not present

## 2017-04-20 DIAGNOSIS — K219 Gastro-esophageal reflux disease without esophagitis: Secondary | ICD-10-CM | POA: Diagnosis not present

## 2017-04-20 DIAGNOSIS — R935 Abnormal findings on diagnostic imaging of other abdominal regions, including retroperitoneum: Secondary | ICD-10-CM | POA: Diagnosis not present

## 2017-04-20 DIAGNOSIS — D649 Anemia, unspecified: Secondary | ICD-10-CM | POA: Diagnosis not present

## 2017-04-20 DIAGNOSIS — E46 Unspecified protein-calorie malnutrition: Secondary | ICD-10-CM | POA: Diagnosis not present

## 2017-04-20 DIAGNOSIS — R1011 Right upper quadrant pain: Secondary | ICD-10-CM | POA: Diagnosis not present

## 2017-04-20 DIAGNOSIS — N189 Chronic kidney disease, unspecified: Secondary | ICD-10-CM | POA: Diagnosis not present

## 2017-04-20 DIAGNOSIS — E8809 Other disorders of plasma-protein metabolism, not elsewhere classified: Secondary | ICD-10-CM | POA: Diagnosis not present

## 2017-04-20 DIAGNOSIS — Z79899 Other long term (current) drug therapy: Secondary | ICD-10-CM | POA: Diagnosis not present

## 2017-04-20 DIAGNOSIS — N058 Unspecified nephritic syndrome with other morphologic changes: Secondary | ICD-10-CM | POA: Diagnosis not present

## 2017-04-20 DIAGNOSIS — D631 Anemia in chronic kidney disease: Secondary | ICD-10-CM | POA: Diagnosis present

## 2017-04-20 DIAGNOSIS — E872 Acidosis: Secondary | ICD-10-CM | POA: Diagnosis not present

## 2017-04-20 DIAGNOSIS — R933 Abnormal findings on diagnostic imaging of other parts of digestive tract: Secondary | ICD-10-CM | POA: Diagnosis not present

## 2017-04-20 DIAGNOSIS — G894 Chronic pain syndrome: Secondary | ICD-10-CM | POA: Diagnosis present

## 2017-04-20 DIAGNOSIS — K529 Noninfective gastroenteritis and colitis, unspecified: Secondary | ICD-10-CM | POA: Diagnosis not present

## 2017-04-20 LAB — CBC WITH DIFFERENTIAL/PLATELET
BASOS ABS: 0 10*3/uL (ref 0.0–0.1)
BASOS PCT: 0 %
Eosinophils Absolute: 0.2 10*3/uL (ref 0.0–0.7)
Eosinophils Relative: 1 %
HEMATOCRIT: 21.5 % — AB (ref 39.0–52.0)
Hemoglobin: 7.2 g/dL — ABNORMAL LOW (ref 13.0–17.0)
LYMPHS ABS: 7.5 10*3/uL — AB (ref 0.7–4.0)
Lymphocytes Relative: 43 %
MCH: 32.7 pg (ref 26.0–34.0)
MCHC: 33.5 g/dL (ref 30.0–36.0)
MCV: 97.7 fL (ref 78.0–100.0)
Monocytes Absolute: 1.4 10*3/uL — ABNORMAL HIGH (ref 0.1–1.0)
Monocytes Relative: 8 %
NEUTROS ABS: 8.4 10*3/uL — AB (ref 1.7–7.7)
NEUTROS PCT: 48 %
Platelets: 142 10*3/uL — ABNORMAL LOW (ref 150–400)
RBC: 2.2 MIL/uL — AB (ref 4.22–5.81)
RDW: 23.7 % — AB (ref 11.5–15.5)
WBC: 17.5 10*3/uL — AB (ref 4.0–10.5)
nRBC: 1 /100 WBC — ABNORMAL HIGH

## 2017-04-20 LAB — URINALYSIS, ROUTINE W REFLEX MICROSCOPIC
BACTERIA UA: NONE SEEN
BILIRUBIN URINE: NEGATIVE
Glucose, UA: 150 mg/dL — AB
Ketones, ur: NEGATIVE mg/dL
Leukocytes, UA: NEGATIVE
NITRITE: NEGATIVE
PH: 9 — AB (ref 5.0–8.0)
Protein, ur: >=300 mg/dL — AB
Specific Gravity, Urine: 1.008 (ref 1.005–1.030)
Squamous Epithelial / LPF: NONE SEEN

## 2017-04-20 LAB — BASIC METABOLIC PANEL
Anion gap: 6 (ref 5–15)
BUN: 16 mg/dL (ref 6–20)
CALCIUM: 7.2 mg/dL — AB (ref 8.9–10.3)
CO2: 20 mmol/L — ABNORMAL LOW (ref 22–32)
CREATININE: 2.18 mg/dL — AB (ref 0.61–1.24)
Chloride: 113 mmol/L — ABNORMAL HIGH (ref 101–111)
GFR calc non Af Amer: 38 mL/min — ABNORMAL LOW (ref >60)
GFR, EST AFRICAN AMERICAN: 44 mL/min — AB (ref >60)
Glucose, Bld: 94 mg/dL (ref 65–99)
Potassium: 3.9 mmol/L (ref 3.5–5.1)
SODIUM: 139 mmol/L (ref 135–145)

## 2017-04-20 LAB — RETICULOCYTES
RBC.: 2.18 MIL/uL — AB (ref 4.22–5.81)
Retic Count, Absolute: 460 10*3/uL — ABNORMAL HIGH (ref 19.0–186.0)
Retic Ct Pct: 21.1 % — ABNORMAL HIGH (ref 0.4–3.1)

## 2017-04-20 LAB — LACTATE DEHYDROGENASE: LDH: 630 U/L — AB (ref 98–192)

## 2017-04-20 LAB — LACTIC ACID, PLASMA: Lactic Acid, Venous: 1.2 mmol/L (ref 0.5–1.9)

## 2017-04-20 LAB — OCCULT BLOOD X 1 CARD TO LAB, STOOL: FECAL OCCULT BLD: NEGATIVE

## 2017-04-20 MED ORDER — SENNOSIDES-DOCUSATE SODIUM 8.6-50 MG PO TABS
1.0000 | ORAL_TABLET | Freq: Two times a day (BID) | ORAL | Status: DC
  Administered 2017-04-20: 1 via ORAL
  Filled 2017-04-20: qty 1

## 2017-04-20 MED ORDER — PIPERACILLIN-TAZOBACTAM 3.375 G IVPB
3.3750 g | Freq: Three times a day (TID) | INTRAVENOUS | Status: DC
  Administered 2017-04-20 – 2017-04-23 (×11): 3.375 g via INTRAVENOUS
  Filled 2017-04-20 (×12): qty 50

## 2017-04-20 MED ORDER — DIPHENHYDRAMINE HCL 25 MG PO CAPS
25.0000 mg | ORAL_CAPSULE | Freq: Once | ORAL | Status: AC
  Administered 2017-04-20: 25 mg via ORAL
  Filled 2017-04-20: qty 1

## 2017-04-20 MED ORDER — DEXTROSE-NACL 5-0.45 % IV SOLN
INTRAVENOUS | Status: AC
  Administered 2017-04-20: 04:00:00 via INTRAVENOUS

## 2017-04-20 MED ORDER — NALOXONE HCL 0.4 MG/ML IJ SOLN: 0.4000 mg | INTRAMUSCULAR | Status: DC | PRN

## 2017-04-20 MED ORDER — HYDROMORPHONE HCL 1 MG/ML IJ SOLN
1.0000 mg | Freq: Once | INTRAMUSCULAR | Status: AC
  Administered 2017-04-20: 1 mg via INTRAVENOUS
  Filled 2017-04-20: qty 1

## 2017-04-20 MED ORDER — ONDANSETRON HCL 4 MG/2ML IJ SOLN: 4.0000 mg | Freq: Four times a day (QID) | INTRAMUSCULAR | Status: DC | PRN

## 2017-04-20 MED ORDER — SODIUM CHLORIDE 0.9% FLUSH
9.0000 mL | INTRAVENOUS | Status: DC | PRN
Start: 2017-04-20 — End: 2017-04-21

## 2017-04-20 MED ORDER — HYDROMORPHONE 1 MG/ML IV SOLN
INTRAVENOUS | Status: DC
  Administered 2017-04-20: 2.1 mg via INTRAVENOUS
  Administered 2017-04-20: 0.9 mg via INTRAVENOUS
  Administered 2017-04-20: 2.6 mg via INTRAVENOUS
  Administered 2017-04-20: 2.7 mg via INTRAVENOUS
  Administered 2017-04-20: 03:00:00 via INTRAVENOUS
  Administered 2017-04-21: 0.6 mg via INTRAVENOUS
  Administered 2017-04-21: 1.2 mg via INTRAVENOUS
  Administered 2017-04-21: 1.9 mg via INTRAVENOUS
  Administered 2017-04-21: 2.7 mg via INTRAVENOUS
  Filled 2017-04-20: qty 25

## 2017-04-20 MED ORDER — POLYETHYLENE GLYCOL 3350 17 G PO PACK: 17.0000 g | PACK | Freq: Every day | ORAL | Status: DC | PRN

## 2017-04-20 MED ORDER — FOLIC ACID 1 MG PO TABS
1.0000 mg | ORAL_TABLET | Freq: Every day | ORAL | Status: DC
  Administered 2017-04-20 – 2017-05-03 (×12): 1 mg via ORAL
  Filled 2017-04-20 (×13): qty 1

## 2017-04-20 MED ORDER — SODIUM BICARBONATE 650 MG PO TABS
650.0000 mg | ORAL_TABLET | Freq: Three times a day (TID) | ORAL | Status: DC
  Administered 2017-04-20 – 2017-05-03 (×37): 650 mg via ORAL
  Filled 2017-04-20 (×38): qty 1

## 2017-04-20 NOTE — Progress Notes (Signed)
Patient ID: [name] [name]., male   DOB: 24-Apr-1980, 37 y.o.   MRN: [id number]  Received a page from the nurse stating that patients mother wanted to speak with me. I call the room and mother is requesting a transfer to [facility] as she states that he was here with similar complaints (Diarrhea and Vomiting) 2 weeks ago and symptoms have not resolved. I spoke with the patient who cosigns on the request to be transferred to [facility]. I have called the [facility] [phone] and am awaiting a call back for consultation with the potential accepting Physician.   Miquel Lamson A.

## 2017-04-20 NOTE — ED Notes (Signed)
ED TO INPATIENT HANDOFF REPORT  Name/Age/Gender [name] [name]. 37 y.o. male  Code Status    Code Status Orders  (From admission, onward)        Start     Ordered   04/20/17 0201  Full code  Continuous     04/20/17 0205    Code Status History    Date Active Date Inactive Code Status Order ID Comments User Context   04/07/2017 17:12 04/09/2017 12:38 Full Code [id number]  Marene Lenz, MD ED   02/27/2017 21:40 03/04/2017 15:08 Full Code [id number]  Jani Gravel, MD ED   12/13/2016 18:12 12/21/2016 16:59 Full Code [id number]  Leana Gamer, MD ED   10/14/2016 19:44 10/18/2016 16:37 Full Code [id number]  Jani Gravel, MD ED   08/02/2016 01:24 08/04/2016 17:31 Full Code [id number]  Norval Morton, MD ED   02/26/2016 21:48 02/27/2016 17:50 Full Code [id number]  Etta Quill, DO ED   03/11/2015 16:36 03/15/2015 15:44 Full Code [id number]  Janora Norlander, DO ED   11/21/2014 03:08 11/24/2014 20:20 Full Code [id number]  Aquilla Hacker, MD Inpatient   08/18/2014 01:48 08/20/2014 18:20 Full Code [id number]  Archie Patten, MD Inpatient   03/22/2014 23:15 03/24/2014 18:26 Full Code [id number]  Toy Baker, MD ED   03/19/2014 20:48 03/20/2014 22:50 Full Code [id number]  Lorna Few, DO Inpatient   12/26/2013 03:44 12/29/2013 21:03 Full Code [id number]  Timmothy Euler, MD ED   07/28/2013 18:33 07/31/2013 17:29 Full Code [id number]  Coral Spikes, DO Inpatient   05/09/2013 19:26 05/10/2013 16:44 Full Code [id number]  Hilton Sinclair, MD ED   01/24/2013 01:43 01/26/2013 19:31 Full Code [id number]  Leone Haven, MD Inpatient   09/25/2012 08:46 09/26/2012 20:20 Full Code [id number]  Ma Hillock, DO ED      Home/SNF/Other Home  Chief Complaint sickle cell crisis  Level of Care/Admitting Diagnosis ED Disposition    ED Disposition Condition [facility] Area: [place] [[id number]]  Level of Care: Med-Surg [16]  Diagnosis: Sickle cell pain  crisis [facility]) [[id number]]  Admitting Physician: [name] [phone]  Attending Physician: [name] [phone]  Estimated length of stay: past midnight tomorrow  Certification:: I certify this patient will need inpatient services for at least 2 midnights  PT Class (Do Not Modify): Inpatient [101]  PT Acc Code (Do Not Modify): Private [1]       Medical History Past Medical History:  Diagnosis Date  . Bacterial pneumonia ~ 2012   "caught it here in the hospital" (09/25/2012)  . Chronic kidney disease    "from my sickle cell" (09/25/2012)  . CKD (chronic kidney disease), stage II   . Cyst of brain    "2 really small ones in the back of my head; inside; saw them w/MRI" (09/25/2012)  . GERD (gastroesophageal reflux disease)    "after I eat alot of spicey foods" (09/25/2012)  . Gynecomastia, male 07/10/2012  . History of blood transfusion    "always related to sickle cell crisis" (09/25/2012)  . Migraines    "take RX qd to prevent them" (09/25/2012)  . Sickle cell anemia (HCC)   . Sickle cell crisis ([place]) 09/25/2012  . Sickle cell nephropathy ([place]) 07/10/2012  . Sinus tachycardia   . Tachycardia with heart rate 121-140 beats per minute with ambulation 08/04/2016    Allergies Allergies  Allergen Reactions  . Nsaids   . Morphine And Related  Other (See Comments)    "real bad headaches"    IV Location/Drains/Wounds Patient Lines/Drains/Airways Status   Active Line/Drains/Airways    None          Labs/Imaging Results for orders placed or performed during the hospital encounter of 04/19/17 (from the past 48 hour(s))  CBC with Differential/Platelet     Status: Abnormal   Collection Time: 04/19/17  8:35 PM  Result Value Ref Range   WBC 18.0 (H) 4.0 - 10.5 K/uL   RBC 1.53 (L) 4.22 - 5.81 MIL/uL   Hemoglobin 5.5 (LL) 13.0 - 17.0 g/dL    Comment: REPEATED TO VERIFY CRITICAL RESULT CALLED TO, READ BACK BY AND VERIFIED WITH: Eustace Quail [id number] @ 2119 BY J SCOTTON     HCT 15.6 (L) 39.0 - 52.0 %   MCV 102.0 (H) 78.0 - 100.0 fL   MCH 35.9 (H) 26.0 - 34.0 pg   MCHC 35.3 30.0 - 36.0 g/dL   RDW 29.2 (H) 11.5 - 15.5 %   Platelets 140 (L) 150 - 400 K/uL   Neutrophils Relative % 46 %   Lymphocytes Relative 43 %   Monocytes Relative 10 %   Eosinophils Relative 1 %   Basophils Relative 0 %   nRBC 1 (H) 0 /100 WBC   Neutro Abs 8.3 (H) 1.7 - 7.7 K/uL   Lymphs Abs 7.7 (H) 0.7 - 4.0 K/uL   Monocytes Absolute 1.8 (H) 0.1 - 1.0 K/uL   Eosinophils Absolute 0.2 0.0 - 0.7 K/uL   Basophils Absolute 0.0 0.0 - 0.1 K/uL   RBC Morphology RARE NRBCs     Comment: POLYCHROMASIA PRESENT TARGET CELLS Sickle cells present PAPPENHEIMER BODIES    WBC Morphology WHITE COUNT CONFIRMED ON SMEAR    Smear Review PLATELET COUNT CONFIRMED BY SMEAR     Comment: Performed at [facility], [place] [address] [place], [place] [address]  Comprehensive metabolic panel     Status: Abnormal   Collection Time: 04/19/17  8:35 PM  Result Value Ref Range   Sodium 141 135 - 145 mmol/L   Potassium 3.6 3.5 - 5.1 mmol/L   Chloride 108 101 - 111 mmol/L   CO2 25 22 - 32 mmol/L   Glucose, Bld 105 (H) 65 - 99 mg/dL   BUN 18 6 - 20 mg/dL   Creatinine, Ser 2.38 (H) 0.61 - 1.24 mg/dL   Calcium 7.0 (L) 8.9 - 10.3 mg/dL   Total Protein 4.4 (L) 6.5 - 8.1 g/dL   Albumin 1.3 (L) 3.5 - 5.0 g/dL   AST 80 (H) 15 - 41 U/L   ALT 35 17 - 63 U/L   Alkaline Phosphatase 197 (H) 38 - 126 U/L   Total Bilirubin 2.2 (H) 0.3 - 1.2 mg/dL   GFR calc non Af Amer 34 (L) >60 mL/min   GFR calc Af Amer 39 (L) >60 mL/min    Comment: (NOTE) The eGFR has been calculated using the CKD EPI equation. This calculation has not been validated in all clinical situations. eGFR's persistently <60 mL/min signify possible Chronic Kidney Disease.    Anion gap 8 5 - 15    Comment: Performed at [facility], [place] [address], [place], [place] [address]  Urinalysis, Routine w reflex microscopic      Status: Abnormal   Collection Time: 04/19/17  8:35 PM  Result Value Ref Range   Color, Urine YELLOW YELLOW   APPearance CLEAR CLEAR   Specific Gravity, Urine 1.008 1.005 -  1.030   pH 9.0 (H) 5.0 - 8.0   Glucose, UA 150 (A) NEGATIVE mg/dL   Hgb urine dipstick LARGE (A) NEGATIVE   Bilirubin Urine NEGATIVE NEGATIVE   Ketones, ur NEGATIVE NEGATIVE mg/dL   Protein, ur >=300 (A) NEGATIVE mg/dL   Nitrite NEGATIVE NEGATIVE   Leukocytes, UA NEGATIVE NEGATIVE   RBC / HPF 0-5 0 - 5 RBC/hpf   WBC, UA 0-5 0 - 5 WBC/hpf   Bacteria, UA NONE SEEN NONE SEEN   Squamous Epithelial / LPF NONE SEEN NONE SEEN    Comment: Performed at [facility], [place] [address] [place], [place] [address]  Type and screen     Status: None (Preliminary result)   Collection Time: 04/19/17  9:40 PM  Result Value Ref Range   ABO/RH(D) O POS    Antibody Screen NEG    Sample Expiration 04/22/2017    Unit Number [id number]    Blood Component Type RED CELLS,LR    Unit division 00    Status of Unit ISSUED    Transfusion Status OK TO TRANSFUSE    Crossmatch Result Compatible    Donor AG Type      NEGATIVE FOR C ANTIGEN NEGATIVE FOR KELL ANTIGEN Performed at [facility], [place] [address], [place], [place] [address]    Unit Number [id number]    Blood Component Type RED CELLS,LR    Unit division 00    Status of Unit ALLOCATED    Transfusion Status OK TO TRANSFUSE    Crossmatch Result Compatible    Donor AG Type NEGATIVE FOR C ANTIGEN NEGATIVE FOR KELL ANTIGEN    Unit Number [id number]    Blood Component Type RED CELLS,LR    Unit division 00    Status of Unit ALLOCATED    Transfusion Status OK TO TRANSFUSE    Crossmatch Result Compatible    Donor AG Type NEGATIVE FOR C ANTIGEN NEGATIVE FOR KELL ANTIGEN   Prepare RBC     Status: None   Collection Time: 04/19/17  9:42 PM  Result Value Ref Range   Order Confirmation      ORDER PROCESSED BY BLOOD BANK Performed at [facility], [place] [address], [place], [place] [address]    Ct Renal Stone Study  Result Date: 04/19/2017 CLINICAL DATA:  Flank pain. Stone disease suspected. Diarrhea and abdominal discomfort. Recent hospital admission for colitis. No improvement, now with weakness. EXAM: CT ABDOMEN AND PELVIS WITHOUT CONTRAST TECHNIQUE: Multidetector CT imaging of the abdomen and pelvis was performed following the standard protocol without IV contrast. COMPARISON:  CT 04/07/2017 FINDINGS: Lower chest: Bibasilar scarring with slight improved lung aeration from prior exam. Trace bilateral pleural thickening. Decreased density of the blood pool consistent with anemia. Hepatobiliary: No focal hepatic lesion. Clips in the gallbladder fossa postcholecystectomy. No biliary dilatation. Pancreas: No ductal dilatation. No definite peripancreatic fat stranding, however mild generalized edema of the upper mesentery. Spleen: Tiny calcified spleen, sequela of sickle cell, unchanged. Adrenals/Urinary Tract: Normal adrenal glands. Bilateral renal parenchymal atrophy, unchanged from prior exam. There is prominence of the left greater than right renal collecting systems and proximal ureter without calcified stone. Urinary bladder is distended without wall thickening. No calcified urolithiasis. Stomach/Bowel: Persistent and slight worsening colonic wall thickening of the ascending and transverse colon, now with involvement of the mid and proximal descending colon. No evidence of perforation. Possible wall thickening of the duodenum, suboptimally assessed without enteric contrast and presence of intra-abdominal ascites.  No other small bowel inflammation. Normal appendix. Vascular/Lymphatic: Innumerable central mesenteric nodes that are not enlarged by size criteria. Decreased blood pool density consistent with anemia. Reproductive: Prostate is unremarkable. Other: Increased abdominopelvic ascites from prior, small to moderate.  Increased mesenteric edema. No free air. No evidence of intra-abdominal abscess. Musculoskeletal: Diffuse osteosclerosis consistent with history of sickle cell. Multiple small endplate depressions throughout spine. No acute finding. IMPRESSION: 1. Persistent/worsening colitis with colonic wall thickening of the ascending and transverse colon, new involvement of the descending colon. Possible new wall thickening of the duodenum. Findings may be infectious or inflammatory. 2. Increased abdominopelvic ascites. 3. Prominence of bilateral renal collecting systems without urolithiasis. This may be secondary to urinary bladder distention. Electronically Signed   By: Jeb Levering M.D.   On: 04/19/2017 23:43    Pending Labs Unresulted Labs (From admission, onward)   Start     Ordered   04/20/17 0207  Lactic acid, plasma  STAT,   R     04/20/17 0206   04/20/17 0205  Gastrointestinal Panel by PCR , Stool  (Gastrointestinal Panel by PCR, Stool)  Once,   R     04/20/17 0205   04/20/17 0203  Lactate dehydrogenase  Once,   R     04/20/17 0205   04/20/17 2263  Basic metabolic panel  Once,   R     04/20/17 0205   04/20/17 0203  CBC with Differential/Platelet  Once,   R     04/20/17 0205   04/19/17 2258  Occult blood card to lab, stool Provider will collect  Once,   STAT    Question:  Specimen to be collected by?  Answer:  Provider will collect   04/19/17 2258   04/19/17 2257  C difficile quick scan w PCR reflex  (C Difficile quick screen w PCR reflex panel)  Once, for 48 hours,   R    Comments:  Laxatives (last 72 hours)   None     Question Answer Comment  Is your patient experiencing loose or watery stools (3 or more in 24 hours)? Yes   Has the patient received laxatives in the last 24 hours? No   Has a negative Cdiff test resulted in the last 7 days? No      04/19/17 2256      Vitals/Pain Today's Vitals   04/20/17 0015 04/20/17 0100 04/20/17 0130 04/20/17 0200  BP: (!) 90/54 98/65 113/81  102/72  Pulse:    89  Resp: _0 Temp:      TempSrc:      SpO2:    98%  PainSc:        Isolation Precautions Enteric precautions (UV disinfection)  Medications Medications  folic acid (FOLVITE) tablet 1 mg (not administered)  sodium bicarbonate tablet 650 mg (not administered)  senna-docusate (Senokot-S) tablet 1 tablet (not administered)  polyethylene glycol (MIRALAX / GLYCOLAX) packet 17 g (not administered)  dextrose 5 %-0.45 % sodium chloride infusion (not administered)  naloxone (NARCAN) injection 0.4 mg (not administered)    And  sodium chloride flush (NS) 0.9 % injection 9 mL (not administered)  ondansetron (ZOFRAN) injection 4 mg (not administered)  HYDROmorphone (DILAUDID) 1 mg/mL PCA injection (not administered)  sodium chloride 0.9 % bolus 1,000 mL (0 mLs Intravenous Stopped 04/19/17 2217)  HYDROmorphone (DILAUDID) injection 1 mg (1 mg Intravenous Given 04/19/17 2045)  diphenhydrAMINE (BENADRYL) injection 25 mg (25 mg Intravenous Given 04/19/17 2045)  HYDROmorphone (DILAUDID) injection 1 mg (  1 mg Intravenous Given 04/19/17 2137)  sodium chloride 0.9 % bolus 1,000 mL (1,000 mLs Intravenous New Bag/Given 04/19/17 2250)  ciprofloxacin (CIPRO) IVPB 400 mg (0 mg Intravenous Stopped 04/20/17 0000)  HYDROmorphone (DILAUDID) injection 1 mg (1 mg Intravenous Given 04/20/17 0137)    Mobility walks

## 2017-04-20 NOTE — H&P (Addendum)
History and Physical    [name] [name]. EXH:[id number] DOB: 1980-02-16 DOA: 04/19/2017  PCP: [name], MD  Patient coming from: Home.  Chief Complaint: Nausea vomiting abdominal pain and back pain.  HPI: [name] [name]. is a 37 y.o. male with history of sickle cell anemia with chronic kidney disease secondary to sickle cell disease presents to the ER with complaints of persistent nausea vomiting diarrhea and back pain and abdominal pain.  Patient was admitted to the hospital 3 weeks ago with similar complaints.  Patient stated since discharge patient had persistent vomiting and diarrhea which is acutely worsened over the last 24 hours.  Patient also noted some blood in the stools.  Over the last 2 weeks patient's pain is mostly in the back which has moved to the friend last 24 hours.  Vomitus does not have any blood.  Stool has been noticed to have some blood.  Denies any fever chills.  Patient also had some chest pain on vomiting.  Denies any shortness of breath.  ED Course: The ER patient blood pressure is in the low normal.  Creatinine is mildly worsened from baseline.  Hemoglobin was around 5.5 and patient's usually is around 6-7.  CT of the abdomen shows worsening of the colitis now involving the descending colon along with ascending and transverse colon and also some changes seen in the duodenum.  Patient was given 1 unit of PRBC in the ER.  Fluids and pain medications and admitted for further management.  Review of Systems: As per HPI, rest all negative.   Past Medical History:  Diagnosis Date  . Bacterial pneumonia ~ 2012   "caught it here in the hospital" (09/25/2012)  . Chronic kidney disease    "from my sickle cell" (09/25/2012)  . CKD (chronic kidney disease), stage II   . Cyst of brain    "2 really small ones in the back of my head; inside; saw them w/MRI" (09/25/2012)  . GERD (gastroesophageal reflux disease)    "after I eat alot of spicey foods"  (09/25/2012)  . Gynecomastia, male 07/10/2012  . History of blood transfusion    "always related to sickle cell crisis" (09/25/2012)  . Migraines    "take RX qd to prevent them" (09/25/2012)  . Sickle cell anemia (HCC)   . Sickle cell crisis ([place]) 09/25/2012  . Sickle cell nephropathy ([place]) 07/10/2012  . Sinus tachycardia   . Tachycardia with heart rate 121-140 beats per minute with ambulation 08/04/2016    Past Surgical History:  Procedure Laterality Date  . CHOLECYSTECTOMY  ~ 2012  . IR FLUORO GUIDE CV LINE RIGHT  12/17/2016  . IR REMOVAL TUN CV CATH W/O FL  12/21/2016  . IR US GUIDE VASC ACCESS RIGHT  12/17/2016  . spleenectomy       reports that he has been smoking cigarettes.  he has never used smokeless tobacco. He reports that he does not drink alcohol or use drugs.  Allergies  Allergen Reactions  . Nsaids   . Morphine And Related Other (See Comments)    "real bad headaches"    Family History  Problem Relation Age of Onset  . Breast cancer Mother     Prior to Admission medications   Medication Sig Start Date End Date Taking? Authorizing Provider  enalapril (VASOTEC) 5 MG tablet Take 5 mg by mouth daily. 12/09/16  Yes [provider]  folic acid (FOLVITE) 1 MG tablet Take 1 tablet (1 mg total) daily  by mouth. 12/22/16  Yes Leana Gamer, MD  hydroxyurea (HYDREA) 500 MG capsule TAKE 3 CAPSULES (1,500 MG TOTAL) BY MOUTH DAILY. Hold taking this medication until discussed with Dr. Alyson Ingles as patient is not taking regularly and the dose which he is prescribed when taken regularly has demonstrated bone marrow supression 12/21/16  Yes Leana Gamer, MD  nortriptyline (PAMELOR) 25 MG capsule TAKE 1 CAPSULE (25 MG TOTAL) BY MOUTH AT BEDTIME. 01/18/17  Yes Donnamae Jude, MD  Oxycodone HCl 10 MG TABS Take 1 tablet (10 mg total) by mouth every 6 (six) hours as needed. 03/04/17  Yes [name], MD  sodium bicarbonate 650 MG tablet Take 1 tablet (650 mg total)  3 (three) times daily by mouth. 12/21/16  Yes Leana Gamer, MD  Menthol-Methyl Salicylate (MUSCLE RUB) 10-15 % CREA Apply 1 application as needed topically for muscle pain. Patient not taking: Reported on 04/19/2017 12/21/16   Leana Gamer, MD    Physical Exam: Vitals:   04/20/17 0015 04/20/17 0100 04/20/17 0130 04/20/17 0200  BP: (!) 90/54 98/65 113/81 102/72  Pulse:    89  Resp: 15 14 19 13   Temp:      TempSrc:      SpO2:    98%      Constitutional: Moderately built and nourished. Vitals:   04/20/17 0015 04/20/17 0100 04/20/17 0130 04/20/17 0200  BP: (!) 90/54 98/65 113/81 102/72  Pulse:    89  Resp: 15 14 19 13   Temp:      TempSrc:      SpO2:    98%   Eyes: Anicteric no pallor. ENMT: No discharge from the ears eyes nose or mouth. Neck: No mass felt.  No neck rigidity. Respiratory: No rhonchi or crepitations. Cardiovascular: S1-S2 heard no murmurs appreciated. Abdomen: Soft nontender bowel sounds present.  No guarding or rigidity. Musculoskeletal: No edema.  No joint effusion. Skin: No rash. Neurologic: Alert awake oriented to time place and person.  Moves all extremities. Psychiatric: Appears normal.  Normal affect.   Labs on Admission: I have personally reviewed following labs and imaging studies  CBC: Recent Labs  Lab 04/19/17 2035  WBC 18.0*  NEUTROABS 8.3*  HGB 5.5*  HCT 15.6*  MCV 102.0*  PLT 010*   Basic Metabolic Panel: Recent Labs  Lab 04/19/17 2035  NA 141  K 3.6  CL 108  CO2 25  GLUCOSE 105*  BUN 18  CREATININE 2.38*  CALCIUM 7.0*   GFR: Estimated Creatinine Clearance: 35.1 mL/min (A) (by C-G formula based on SCr of 2.38 mg/dL (H)). Liver Function Tests: Recent Labs  Lab 04/19/17 2035  AST 80*  ALT 35  ALKPHOS 197*  BILITOT 2.2*  PROT 4.4*  ALBUMIN 1.3*   No results for input(s): LIPASE, AMYLASE in the last 168 hours. No results for input(s): AMMONIA in the last 168 hours. Coagulation Profile: No results for  input(s): INR, PROTIME in the last 168 hours. Cardiac Enzymes: No results for input(s): CKTOTAL, CKMB, CKMBINDEX, TROPONINI in the last 168 hours. BNP (last 3 results) No results for input(s): PROBNP in the last 8760 hours. HbA1C: No results for input(s): HGBA1C in the last 72 hours. CBG: No results for input(s): GLUCAP in the last 168 hours. Lipid Profile: No results for input(s): CHOL, HDL, LDLCALC, TRIG, CHOLHDL, LDLDIRECT in the last 72 hours. Thyroid Function Tests: No results for input(s): TSH, T4TOTAL, FREET4, T3FREE, THYROIDAB in the last 72 hours. Anemia Panel: No results  for input(s): VITAMINB12, FOLATE, FERRITIN, TIBC, IRON, RETICCTPCT in the last 72 hours. Urine analysis:    Component Value Date/Time   COLORURINE YELLOW 04/19/2017 2035   APPEARANCEUR CLEAR 04/19/2017 2035   LABSPEC 1.008 04/19/2017 2035   PHURINE 9.0 (H) 04/19/2017 2035   GLUCOSEU 150 (A) 04/19/2017 2035   HGBUR LARGE (A) 04/19/2017 2035   HGBUR small 03/10/2010 1445   BILIRUBINUR NEGATIVE 04/19/2017 2035   KETONESUR NEGATIVE 04/19/2017 2035   PROTEINUR >=300 (A) 04/19/2017 2035   UROBILINOGEN 0.2 11/21/2014 2003   NITRITE NEGATIVE 04/19/2017 2035   LEUKOCYTESUR NEGATIVE 04/19/2017 2035   Sepsis Labs: @LABRCNTIP (procalcitonin:4,lacticidven:4) )No results found for this or any previous visit (from the past 240 hour(s)).   Radiological Exams on Admission: Ct Renal Stone Study  Result Date: 04/19/2017 CLINICAL DATA:  Flank pain. Stone disease suspected. Diarrhea and abdominal discomfort. Recent hospital admission for colitis. No improvement, now with weakness. EXAM: CT ABDOMEN AND PELVIS WITHOUT CONTRAST TECHNIQUE: Multidetector CT imaging of the abdomen and pelvis was performed following the standard protocol without IV contrast. COMPARISON:  CT 04/07/2017 FINDINGS: Lower chest: Bibasilar scarring with slight improved lung aeration from prior exam. Trace bilateral pleural thickening. Decreased density  of the blood pool consistent with anemia. Hepatobiliary: No focal hepatic lesion. Clips in the gallbladder fossa postcholecystectomy. No biliary dilatation. Pancreas: No ductal dilatation. No definite peripancreatic fat stranding, however mild generalized edema of the upper mesentery. Spleen: Tiny calcified spleen, sequela of sickle cell, unchanged. Adrenals/Urinary Tract: Normal adrenal glands. Bilateral renal parenchymal atrophy, unchanged from prior exam. There is prominence of the left greater than right renal collecting systems and proximal ureter without calcified stone. Urinary bladder is distended without wall thickening. No calcified urolithiasis. Stomach/Bowel: Persistent and slight worsening colonic wall thickening of the ascending and transverse colon, now with involvement of the mid and proximal descending colon. No evidence of perforation. Possible wall thickening of the duodenum, suboptimally assessed without enteric contrast and presence of intra-abdominal ascites. No other small bowel inflammation. Normal appendix. Vascular/Lymphatic: Innumerable central mesenteric nodes that are not enlarged by size criteria. Decreased blood pool density consistent with anemia. Reproductive: Prostate is unremarkable. Other: Increased abdominopelvic ascites from prior, small to moderate. Increased mesenteric edema. No free air. No evidence of intra-abdominal abscess. Musculoskeletal: Diffuse osteosclerosis consistent with history of sickle cell. Multiple small endplate depressions throughout spine. No acute finding. IMPRESSION: 1. Persistent/worsening colitis with colonic wall thickening of the ascending and transverse colon, new involvement of the descending colon. Possible new wall thickening of the duodenum. Findings may be infectious or inflammatory. 2. Increased abdominopelvic ascites. 3. Prominence of bilateral renal collecting systems without urolithiasis. This may be secondary to urinary bladder distention.  Electronically Signed   By: Jeb Levering M.D.   On: 04/19/2017 23:43     Assessment/Plan Principal Problem:   Sickle cell pain crisis (HCC) Active Problems:   CHRONIC KIDNEY DISEASE STAGE II (MILD)   Colitis   Nausea vomiting and diarrhea    1. Sickle cell pain crisis -patient has been placed on Dilaudid full dose PCA.  Continue with hydration. 2. Abdominal pain with nausea vomiting and diarrhea and CAT scan showing colitis -differentials include inflammatory versus infectious versus ischemic.  Will check lactic acid.  Will keep patient on empiric antibiotics for now and get stool studies including GI pathogen panel and C. Difficile.  Since patient has noticed blood in the stools follow CBC closely and may need colonoscopy if symptoms do not improve. 3. Anemia with sickle  cell disease -since patient's hemoglobin further worsened with low normal blood pressure and increasing pain 1 unit of PRBC was ordered.  Macrocytic picture likely from hydroxyurea use.  Patient has not been using it for last few weeks. 4. Chronic kidney disease stage III  -we will hold ACE inhibitor due to low normal blood pressure..  Gently hydrating due to sickle cell pain crisis and also nausea vomiting and diarrhea.  Follow metabolic panel. 5. Number cytopenia appears to be chronic.  Follow CBC. 6. Leukocytosis -no fever at this time.  Follow stool studies. 7. Mildly elevated LFTs -patient's CAT scan shows some duodenal inflammation.  Follow LFTs.   DVT prophylaxis: SCDs since patient has noticed some blood in the stool. Code Status: Full code. Family Communication: Patient's family at the bedside. Disposition Plan: Home. Consults called: None. Admission status: Inpatient.   [name] MD [facility] Pager [phone].  If 7PM-7AM, please contact night-coverage [email or web address] Password [email or web address]  04/20/2017, 2:05 AM

## 2017-04-20 NOTE — Progress Notes (Addendum)
Patient's stool is formed and sent to lab for testing. Stool specimen does not meet criteria for CDiff testing. Contact Enteric Precautions discontinued per Infectious Disease Policy. Will continue to monitor patient's bowel movements.

## 2017-04-20 NOTE — Progress Notes (Signed)
Patient mother had questions about possible transfer to [facility]. According to Dr. Zigmund Daniel note, transfer center called awaiting a call back. [place] NP was called about any update on patient transfer. [place] NP advised to do a follow-up in the morning because she did not get any up on a tranfers. [place] was called and spoken to [facility] who stated that they are waiting on patient physician to respond to their page. Patient is currently stable. Patient mother at bedside.

## 2017-04-20 NOTE — Progress Notes (Signed)
Pharmacy Antibiotic Note  [name] [name]. is a 37 y.o. male admitted on 04/19/2017 with intra-abdominal infection.  Pharmacy has been consulted for Zosyn dosing.  Plan: Zosyn 3.375g IV q8h (4 hour infusion).     Dosage will likely remain stable at above dosage and need for further dosage adjustment appears unlikely at present.    Will sign off at this time.  Please reconsult if a change in clinical status warrants re-evaluation of dosage.     Height: 5\' 2"  (157.5 cm) Weight: 131 lb 9.8 oz (59.7 kg) IBW/kg (Calculated) : 54.6  Temp (24hrs), Avg:98.4 F (36.9 C), Min:98.1 F (36.7 C), Max:98.7 F (37.1 C)  Recent Labs  Lab 04/19/17 2035  WBC 18.0*  CREATININE 2.38*    Estimated Creatinine Clearance: 33.8 mL/min (A) (by C-G formula based on SCr of 2.38 mg/dL (H)).    Allergies  Allergen Reactions  . Nsaids   . Morphine And Related Other (See Comments)    "real bad headaches"    Antimicrobials this admission: 3/8 Zosyn >>    Dose adjustments this admission: ---  Microbiology results:   Thank you for allowing pharmacy to be a part of this patient's care.   Royetta Asal, PharmD, BCPS Pager [phone] 04/20/2017 3:27 AM

## 2017-04-21 DIAGNOSIS — D696 Thrombocytopenia, unspecified: Secondary | ICD-10-CM

## 2017-04-21 DIAGNOSIS — K529 Noninfective gastroenteritis and colitis, unspecified: Secondary | ICD-10-CM

## 2017-04-21 DIAGNOSIS — D72829 Elevated white blood cell count, unspecified: Secondary | ICD-10-CM

## 2017-04-21 DIAGNOSIS — N179 Acute kidney failure, unspecified: Secondary | ICD-10-CM

## 2017-04-21 DIAGNOSIS — R188 Other ascites: Secondary | ICD-10-CM

## 2017-04-21 DIAGNOSIS — D649 Anemia, unspecified: Secondary | ICD-10-CM

## 2017-04-21 DIAGNOSIS — D5700 Hb-SS disease with crisis, unspecified: Principal | ICD-10-CM

## 2017-04-21 DIAGNOSIS — R197 Diarrhea, unspecified: Secondary | ICD-10-CM

## 2017-04-21 DIAGNOSIS — R933 Abnormal findings on diagnostic imaging of other parts of digestive tract: Secondary | ICD-10-CM

## 2017-04-21 DIAGNOSIS — N189 Chronic kidney disease, unspecified: Secondary | ICD-10-CM

## 2017-04-21 LAB — BASIC METABOLIC PANEL
ANION GAP: 7 (ref 5–15)
Anion gap: 5 (ref 5–15)
BUN: 16 mg/dL (ref 6–20)
BUN: 17 mg/dL (ref 6–20)
CHLORIDE: 111 mmol/L (ref 101–111)
CHLORIDE: 110 mmol/L (ref 101–111)
CO2: 22 mmol/L (ref 22–32)
CO2: 20 mmol/L — ABNORMAL LOW (ref 22–32)
Calcium: 7.6 mg/dL — ABNORMAL LOW (ref 8.9–10.3)
Calcium: 7.4 mg/dL — ABNORMAL LOW (ref 8.9–10.3)
Creatinine, Ser: 3.1 mg/dL — ABNORMAL HIGH (ref 0.61–1.24)
Creatinine, Ser: 3.33 mg/dL — ABNORMAL HIGH (ref 0.61–1.24)
GFR calc non Af Amer: 23 mL/min — ABNORMAL LOW (ref >60)
GFR, EST AFRICAN AMERICAN: 29 mL/min — AB (ref >60)
GFR, EST AFRICAN AMERICAN: 26 mL/min — AB (ref >60)
GFR, EST NON AFRICAN AMERICAN: 25 mL/min — AB (ref >60)
Glucose, Bld: 104 mg/dL — ABNORMAL HIGH (ref 65–99)
Glucose, Bld: 103 mg/dL — ABNORMAL HIGH (ref 65–99)
POTASSIUM: 4.4 mmol/L (ref 3.5–5.1)
Potassium: 4.2 mmol/L (ref 3.5–5.1)
SODIUM: 138 mmol/L (ref 135–145)
SODIUM: 137 mmol/L (ref 135–145)

## 2017-04-21 LAB — HEPATIC FUNCTION PANEL
ALBUMIN: 1.3 g/dL — AB (ref 3.5–5.0)
ALK PHOS: 184 U/L — AB (ref 38–126)
ALT: 29 U/L (ref 17–63)
AST: 73 U/L — ABNORMAL HIGH (ref 15–41)
Bilirubin, Direct: 0.7 mg/dL — ABNORMAL HIGH (ref 0.1–0.5)
Indirect Bilirubin: 1.8 mg/dL — ABNORMAL HIGH (ref 0.3–0.9)
TOTAL PROTEIN: 4.3 g/dL — AB (ref 6.5–8.1)
Total Bilirubin: 2.5 mg/dL — ABNORMAL HIGH (ref 0.3–1.2)

## 2017-04-21 LAB — CBC WITH DIFFERENTIAL/PLATELET
Basophils Absolute: 0 10*3/uL (ref 0.0–0.1)
Basophils Relative: 0 %
EOS ABS: 0.5 10*3/uL (ref 0.0–0.7)
EOS PCT: 3 %
HCT: 21.8 % — ABNORMAL LOW (ref 39.0–52.0)
HEMOGLOBIN: 7.7 g/dL — AB (ref 13.0–17.0)
LYMPHS PCT: 37 %
Lymphs Abs: 6 10*3/uL — ABNORMAL HIGH (ref 0.7–4.0)
MCH: 34.4 pg — AB (ref 26.0–34.0)
MCHC: 35.3 g/dL (ref 30.0–36.0)
MCV: 97.3 fL (ref 78.0–100.0)
Monocytes Absolute: 1.5 10*3/uL — ABNORMAL HIGH (ref 0.1–1.0)
Monocytes Relative: 9 %
NEUTROS PCT: 51 %
Neutro Abs: 8.2 10*3/uL — ABNORMAL HIGH (ref 1.7–7.7)
Platelets: 127 10*3/uL — ABNORMAL LOW (ref 150–400)
RBC: 2.24 MIL/uL — AB (ref 4.22–5.81)
RDW: 26.1 % — ABNORMAL HIGH (ref 11.5–15.5)
WBC: 16.2 10*3/uL — ABNORMAL HIGH (ref 4.0–10.5)

## 2017-04-21 LAB — RETICULOCYTES: RBC.: 2.24 MIL/uL — ABNORMAL LOW (ref 4.22–5.81)

## 2017-04-21 MED ORDER — NALOXONE HCL 0.4 MG/ML IJ SOLN: 0.4000 mg | INTRAMUSCULAR | Status: DC | PRN

## 2017-04-21 MED ORDER — SODIUM CHLORIDE 0.9 % IV SOLN
INTRAVENOUS | Status: DC
  Administered 2017-04-21 – 2017-04-22 (×3): via INTRAVENOUS

## 2017-04-21 MED ORDER — SODIUM CHLORIDE 0.9% FLUSH: 9.0000 mL | INTRAVENOUS | Status: DC | PRN

## 2017-04-21 MED ORDER — HYDROMORPHONE 1 MG/ML IV SOLN
INTRAVENOUS | Status: DC
  Administered 2017-04-21: 3 mg via INTRAVENOUS
  Administered 2017-04-21: 3.6 mg via INTRAVENOUS
  Administered 2017-04-21: 3.5 mg via INTRAVENOUS
  Administered 2017-04-22: 3 mg via INTRAVENOUS
  Administered 2017-04-22: 5 mg via INTRAVENOUS
  Administered 2017-04-22: 3.5 mg via INTRAVENOUS
  Administered 2017-04-22: 7.5 mg via INTRAVENOUS
  Administered 2017-04-22: 2.5 mg via INTRAVENOUS
  Administered 2017-04-22: 2 mg via INTRAVENOUS
  Administered 2017-04-23: 3 mg via INTRAVENOUS
  Administered 2017-04-23: 04:00:00 via INTRAVENOUS
  Administered 2017-04-23: 2 mg via INTRAVENOUS
  Administered 2017-04-23: 3.5 mg via INTRAVENOUS
  Filled 2017-04-21 (×2): qty 25

## 2017-04-21 NOTE — Progress Notes (Signed)
SICKLE CELL SERVICE PROGRESS NOTE  [name] [name]. SAY:[id number] DOB: 26-Jul-1980 DOA: 04/19/2017 PCP: [name], MD  Assessment/Plan: Principal Problem:   Sickle cell pain crisis ([place]) Active Problems:   CHRONIC KIDNEY DISEASE STAGE II (MILD)   Colitis   Nausea vomiting and diarrhea  1. Acute Colitis: It is not clear that the colitis is infectious. He may have an ischemic component. Will ask GI to see in consult. I do not think think that the Colitis is infectious but may rather be ischemic. I feel that he may require a colonoscopy. Continue Zosyn. Diarrhea resolved.  2. Acute on Chronic Renal Failure: Renal Function worsening. All nephrotoxic medications were discontinued yesterday. Will start 0.9 NS at 125 ml/hr and keep tract of I&O. Recheck renal function this evening.  3. Abdominal Ascites: The ascites may be related to renal disease possibly leading to nephrotic syndrome. Will check serum lipids and do a 24 hours protein collection.  4. Hb SS with Crisis: Will increase PCA bolus to 0.5 mg. Pt unable to receive NSAID's due to AKI. Continue IVF.  5. Thrombocytopenia: Etiology unclear. Likely multifactorial. Also patient on Zosyn which can lead to thrombocytopenia.  6. Leukocytosis: This is a feature of sickle cell crisis. However it may also be related to the abdominal process. However a review of his records show that he has chronically elevated WBC.  7. Chronic Pain Syndrome: Pt takes about 20 mg of Oxycodone daily.  8. Chronic Metabolic Acidosis: Pt takes Sodium Bicarbonate on a chronic basis. Serum bicarbonate normal. Continue.   Code Status: Full Code Family Communication: N/A Disposition Plan: Not yet ready for discharge  [place].  Pager [phone]. If 7PM-7AM, please contact night-coverage.  04/21/2017, 11:22 AM  LOS: 1 day   Interim History: Pt c/o pain in low back at intensity of 9/10 and around to the abdominal area. He states that he does not take  Hydrea. Pt states that he was seen by Nephrology in 02/2017. He does not know his baseline creatinine. He states that he has been taking the Vasotec up until 3 days prior to hospitalization. He states that was taking Aleve then he states that he has not opened the bottle yet. Not sure if he has been taking the Aleve. Pt also states that he has been feeling thirsty, urinating more than usual and eating a lot. He is unsure if diabetes is in his family. Pt also stated that he noticed that he was having bloating since 04/07/2017. Pt denies any fevers between admissions or even before the last admission. Pt states that he is still having diarrhea although the staff reports formed stool. He has had no further emesis.   Pt very hostile and angry. He is angry that he was discharged home on his last admission. During the encounter he interrupted to call the kitchen as he was dissatisfied with the food. He expressed anger when I asked him to turn the TV down so that I could explain to him the plan. When I asked for teach back, he became boisterous stating that he was not stupid despite me explaining that it was to make sure that I explained things to him correctly. He then starting complaining stating that people aren't ding their job. Pt also making very uncomplimentary comments about the staff and  the food and states that no-one here can figure out his medical problems and  he is supposed to be transferred to [facility] although he was advised and I informed him  again that the transfer was declined by the Physician at [facility].   Consultants:  Gastroenterology- Dr. Hilarie Fredrickson  Procedures:  None  Antibiotics:  Ciprofloxacin 3/7  Zosyn 3/8 >>   Objective: Vitals:   04/21/17 0111 04/21/17 0406 04/21/17 0809 04/21/17 1005  BP:  106/64  103/70  Pulse:  85  90  Resp: 12 16 11 14   Temp:  98.9 F (37.2 C)  98.4 F (36.9 C)  TempSrc:  Oral  Oral  SpO2: 96% 98% 94% 93%  Weight:      Height:       Weight change:    Intake/Output Summary (Last 24 hours) at 04/21/2017 1122 Last data filed at 04/21/2017 1005 Gross per 24 hour  Intake 1330 ml  Output 1151 ml  Net 179 ml     Physical Exam General: Alert, awake, oriented x3, in no apparent distress.  HEENT: [place]/AT PEERL, EOMI, anicteric Neck: Trachea midline,  no masses, no thyromegal,y no JVD, no carotid bruit OROPHARYNX:  Moist, No exudate/ erythema/lesions.  Heart: Refused examination.   Lungs: Clear to auscultation, no wheezing or rhonchi noted. No increased vocal fremitus resonant to percussion  Abdomen: Soft, mild RUQ tenderness and tenderness in the right flank region. Abdomen is nondistended, positive bowel sounds, no masses no hepatosplenomegaly noted.  Neuro: No focal neurological deficits noted cranial nerves II through XII grossly intact.  Strength at baseline in bilateral upper and lower extremities. Musculoskeletal: No warmth swelling or erythema around joints, no spinal tenderness noted. Psychiatric: Patient alert and oriented x 3,  good recent to remote recall.    Data Reviewed: Basic Metabolic Panel: Recent Labs  Lab 04/19/17 2035 04/20/17 0413 04/21/17 1019  NA 141 139 138  K 3.6 3.9 4.2  CL 108 113* 111  CO2 25 20* 22  GLUCOSE 105* 94 104*  BUN 18 16 16   CREATININE 2.38* 2.18* 3.10*  CALCIUM 7.0* 7.2* 7.6*   Liver Function Tests: Recent Labs  Lab 04/19/17 2035 04/21/17 1019  AST 80* 73*  ALT 35 29  ALKPHOS 197* 184*  BILITOT 2.2* 2.5*  PROT 4.4* 4.3*  ALBUMIN 1.3* 1.3*   No results for input(s): LIPASE, AMYLASE in the last 168 hours. No results for input(s): AMMONIA in the last 168 hours. CBC: Recent Labs  Lab 04/19/17 2035 04/20/17 0413 04/21/17 1019  WBC 18.0* 17.5* 16.2*  NEUTROABS 8.3* 8.4* PENDING  HGB 5.5* 7.2* 7.7*  HCT 15.6* 21.5* 21.8*  MCV 102.0* 97.7 97.3  PLT 140* 142* 127*   Cardiac Enzymes: No results for input(s): CKTOTAL, CKMB, CKMBINDEX, TROPONINI in the last 168 hours. BNP (last 3  results) No results for input(s): BNP in the last 8760 hours.  ProBNP (last 3 results) No results for input(s): PROBNP in the last 8760 hours.  CBG: No results for input(s): GLUCAP in the last 168 hours.  No results found for this or any previous visit (from the past 240 hour(s)).   Studies: Dg Chest Port 1 View  Result Date: 04/07/2017 CLINICAL DATA:  Sickle cell pain crisis.  Right flank pain. EXAM: PORTABLE CHEST 1 VIEW COMPARISON:  02/27/2017 FINDINGS: The heart is normal in size allowing for AP technique and low lung volumes. Lungs are under aerated with bibasilar atelectasis. No pneumothorax or pleural effusion. There are sclerotic changes and articular surface collapse involving the right humeral head consistent with avascular necrosis and bone infarct. IMPRESSION: Bibasilar atelectasis. Bony changes related to sickle cell disorder and bone infarct. Electronically Signed   By:  Marybelle Killings M.D.   On: 04/07/2017 14:03   Ct Renal Stone Study  Result Date: 04/19/2017 CLINICAL DATA:  Flank pain. Stone disease suspected. Diarrhea and abdominal discomfort. Recent hospital admission for colitis. No improvement, now with weakness. EXAM: CT ABDOMEN AND PELVIS WITHOUT CONTRAST TECHNIQUE: Multidetector CT imaging of the abdomen and pelvis was performed following the standard protocol without IV contrast. COMPARISON:  CT 04/07/2017 FINDINGS: Lower chest: Bibasilar scarring with slight improved lung aeration from prior exam. Trace bilateral pleural thickening. Decreased density of the blood pool consistent with anemia. Hepatobiliary: No focal hepatic lesion. Clips in the gallbladder fossa postcholecystectomy. No biliary dilatation. Pancreas: No ductal dilatation. No definite peripancreatic fat stranding, however mild generalized edema of the upper mesentery. Spleen: Tiny calcified spleen, sequela of sickle cell, unchanged. Adrenals/Urinary Tract: Normal adrenal glands. Bilateral renal parenchymal atrophy,  unchanged from prior exam. There is prominence of the left greater than right renal collecting systems and proximal ureter without calcified stone. Urinary bladder is distended without wall thickening. No calcified urolithiasis. Stomach/Bowel: Persistent and slight worsening colonic wall thickening of the ascending and transverse colon, now with involvement of the mid and proximal descending colon. No evidence of perforation. Possible wall thickening of the duodenum, suboptimally assessed without enteric contrast and presence of intra-abdominal ascites. No other small bowel inflammation. Normal appendix. Vascular/Lymphatic: Innumerable central mesenteric nodes that are not enlarged by size criteria. Decreased blood pool density consistent with anemia. Reproductive: Prostate is unremarkable. Other: Increased abdominopelvic ascites from prior, small to moderate. Increased mesenteric edema. No free air. No evidence of intra-abdominal abscess. Musculoskeletal: Diffuse osteosclerosis consistent with history of sickle cell. Multiple small endplate depressions throughout spine. No acute finding. IMPRESSION: 1. Persistent/worsening colitis with colonic wall thickening of the ascending and transverse colon, new involvement of the descending colon. Possible new wall thickening of the duodenum. Findings may be infectious or inflammatory. 2. Increased abdominopelvic ascites. 3. Prominence of bilateral renal collecting systems without urolithiasis. This may be secondary to urinary bladder distention. Electronically Signed   By: Jeb Levering M.D.   On: 04/19/2017 23:43   Ct Renal Stone Study  Result Date: 04/07/2017 CLINICAL DATA:  Right-sided flank pain beginning today. Sickle cell disease. EXAM: CT ABDOMEN AND PELVIS WITHOUT CONTRAST TECHNIQUE: Multidetector CT imaging of the abdomen and pelvis was performed following the standard protocol without IV contrast. COMPARISON:  None. FINDINGS: Lower chest: Bibasilar  scarring. Asymmetric opacity in left lung base may be due to atelectasis or infiltrate. Hepatobiliary: No mass visualized on this unenhanced exam. Prior cholecystectomy. No evidence of biliary obstruction. Pancreas: No mass or inflammatory process visualized on this unenhanced exam. Spleen: Tiny calcified spleen, consistent with auto splenectomy of sickle cell disease. Adrenals/Urinary tract: Bilateral renal parenchymal atrophy. No evidence of urolithiasis or hydronephrosis. Unremarkable unopacified urinary bladder. Stomach/Bowel: Mild to moderate wall thickening is seen involving the ascending and transverse colon, consistent with colitis. No evidence of bowel obstruction or abscess. Mild ascites noted. Vascular/Lymphatic: No pathologically enlarged lymph nodes identified. No evidence of abdominal aortic aneurysm. Reproductive:  No mass or other significant abnormality. Other:  None. Musculoskeletal: No suspicious bone lesions identified. Diffuse osteosclerosis, consistent with sickle cell disease. IMPRESSION: Colitis involving the ascending and transverse colon.  Mild ascites. No evidence of urolithiasis or hydronephrosis. Mild atelectasis or scarring in left lung base. Diffuse osteosclerosis and auto-splenectomy, consistent with chronic sickle cell disease. Electronically Signed   By: Earle Gell M.D.   On: 04/07/2017 15:56    Scheduled Meds: . folic  acid  1 mg Oral Daily  . HYDROmorphone   Intravenous Q4H  . sodium bicarbonate  650 mg Oral TID   Continuous Infusions: . sodium chloride    . piperacillin-tazobactam (ZOSYN)  IV Stopped (04/21/17 6924)    Principal Problem:   Sickle cell pain crisis ([place]) Active Problems:   CHRONIC KIDNEY DISEASE STAGE II (MILD)   Colitis   Nausea vomiting and diarrhea    In excess of 40 minutes spent during this visit. Greater than 50% involved face to face contact with the patient for assessment, counseling and coordination of care.

## 2017-04-21 NOTE — Progress Notes (Signed)
Patient ID: [name] [name]., male   DOB: 04-10-1980, 37 y.o.   MRN: [id number]  Late Entry from 9:51 pm last night (04/20/2017)  Spoke with Dr. Laurin Coder at [facility] via the transfer line and the transfer was declined. Dr. Laurin Coder felt that [facility] would not offer any superior care for the patients acute condition. Additionally he is not an established patient of the [name] so a transfer would not afford continuity of care and most pertinent is that the medical center is at capacity and have no beds available at present. Pt would not be placed on a waiting list and the transfer request and process was closed.  I called and spoke with Nurse Joaquim Nam and made her aware of the decision and asked her to convey the information to the patient and his mother.   MATTHEWS,[name]

## 2017-04-21 NOTE — Consult Note (Addendum)
[facility] Consult: 11:32 AM 04/21/2017  LOS: 1 day    Referring Provider: Liston Alba MD  Primary Care Physician:  [name], MD Primary Gastroenterologist:  unassigned    Reason for Consultation:  Colitis   HPI: [name] [name]. is a 37 y.o. male.  P Hx SS disease.  CKD stage 2/3, sickle cell nephropathy.  GERD.   S/P cholecystectomy.  Fatty liver per ultrasound 02/28/2017.  Elevated LFTs.   Chronic pain,  Chronic narcotics.  Chronically elevated WBCs, acute on chronic anemia and thrombocytopenia.  S/p multiple RBC transfusions, last was 03/05/17.     1/15 - 03/04/17 admission for acute chest syndrome, possible PNA.      04/07/17 - 04/09/17 admit with right flank pain, loose stool, vomiting.   Kidney stone CT study 2/23: ascending and transverse colitis.  Auto-splenectomy.  Osteosclerosis.   WBCs as high as 23.9 (but was 21.3 on 03/03/17).  Hgb nadir 5.7.   Diarrhea resolved.  Did not have stool studies sent.  No abx at discharge.    After he went back home, the right flank pain returned, he was nauseated, vomiting and having about 3 stools per day.  Stools went anywhere from watery to soft, to form.  Although he had seen some small amount of blood prior to his recent hospitalization, he has not seen any blood since then.  He says the stool is dark/black in color which is not normal for him. Kidney stone CR study 3/7:  Persistent/worsening colitis with wall thickening of the ascending and transverse colon, new involvement of the descending colon. Possible new wall thickening of the duodenum. Findings may be infectious or inflammatory.   Increased abdominopelvic ascites.  Prominence of bilateral renal collecting systems without urolithiasis. This may be secondary to urinary bladder distention. C  Diff is ordered, not collected.   WBCs 18 K.  Hgb 5.5 >> 1 U PRBC >> 7.2.   FOBT negative.  Tox + opiates and THC AKI Empiric Zosyn in place.    Fm hx no intestinal or digestive diseases.  Mother is breast cancer survivor.    Past Medical History:  Diagnosis Date  . Bacterial pneumonia ~ 2012   "caught it here in the hospital" (09/25/2012)  . Chronic kidney disease    "from my sickle cell" (09/25/2012)  . CKD (chronic kidney disease), stage II   . Cyst of brain    "2 really small ones in the back of my head; inside; saw them w/MRI" (09/25/2012)  . GERD (gastroesophageal reflux disease)    "after I eat alot of spicey foods" (09/25/2012)  . Gynecomastia, male 07/10/2012  . History of blood transfusion    "always related to sickle cell crisis" (09/25/2012)  . Migraines    "take RX qd to prevent them" (09/25/2012)  . Sickle cell anemia (HCC)   . Sickle cell crisis ([place]) 09/25/2012  . Sickle cell nephropathy ([place]) 07/10/2012  . Sinus tachycardia   . Tachycardia with heart rate 121-140 beats per minute with ambulation 08/04/2016    Past Surgical  History:  Procedure Laterality Date  . CHOLECYSTECTOMY  ~ 2012  . IR FLUORO GUIDE CV LINE RIGHT  12/17/2016  . IR REMOVAL TUN CV CATH W/O FL  12/21/2016  . IR US GUIDE VASC ACCESS RIGHT  12/17/2016  . spleenectomy      Prior to Admission medications   Medication Sig Start Date End Date Taking? Authorizing Provider  enalapril (VASOTEC) 5 MG tablet Take 5 mg by mouth daily. 12/09/16  Yes [provider]  folic acid (FOLVITE) 1 MG tablet Take 1 tablet (1 mg total) daily by mouth. 12/22/16  Yes Leana Gamer, MD  hydroxyurea (HYDREA) 500 MG capsule TAKE 3 CAPSULES (1,500 MG TOTAL) BY MOUTH DAILY. Hold taking this medication until discussed with Dr. Alyson Ingles as patient is not taking regularly and the dose which he is prescribed when taken regularly has demonstrated bone marrow supression 12/21/16  Yes Leana Gamer, MD  nortriptyline  (PAMELOR) 25 MG capsule TAKE 1 CAPSULE (25 MG TOTAL) BY MOUTH AT BEDTIME. 01/18/17  Yes Donnamae Jude, MD  Oxycodone HCl 10 MG TABS Take 1 tablet (10 mg total) by mouth every 6 (six) hours as needed. 03/04/17  Yes [name], MD  sodium bicarbonate 650 MG tablet Take 1 tablet (650 mg total) 3 (three) times daily by mouth. 12/21/16  Yes Leana Gamer, MD  Menthol-Methyl Salicylate (MUSCLE RUB) 10-15 % CREA Apply 1 application as needed topically for muscle pain. Patient not taking: Reported on 04/19/2017 12/21/16   Leana Gamer, MD    Scheduled Meds: . folic acid  1 mg Oral Daily  . HYDROmorphone   Intravenous Q4H  . sodium bicarbonate  650 mg Oral TID   Infusions: . sodium chloride    . piperacillin-tazobactam (ZOSYN)  IV Stopped (04/21/17 0811)   PRN Meds: naloxone **AND** sodium chloride flush, ondansetron (ZOFRAN) IV   Allergies as of 04/19/2017 - Review Complete 04/19/2017  Allergen Reaction Noted  . Nsaids  12/13/2016  . Morphine and related Other (See Comments) 09/25/2012    Family History  Problem Relation Age of Onset  . Breast cancer Mother     Social History   Socioeconomic History  . Marital status: Single    Spouse name: Not on file  . Number of children: Not on file  . Years of education: Not on file  . Highest education level: Not on file  Social Needs  . Financial resource strain: Not on file  . Food insecurity - worry: Not on file  . Food insecurity - inability: Not on file  . Transportation needs - medical: Not on file  . Transportation needs - non-medical: Not on file  Occupational History  . Not on file  Tobacco Use  . Smoking status: Current Some Day Smoker    Types: Cigarettes  . Smokeless tobacco: Never Used  . Tobacco comment: 09/25/2012 "I don't buy cigarettes; bum one from friends q now and then"  Substance and Sexual Activity  . Alcohol use: No    Alcohol/week: 0.0 oz    Frequency: Never  . Drug use: No    Comment:  09/25/2012 "weed was my biggest problem; stopped that ~ 1 1/2 months ago; I don't do cocaine; can't say I never have though"  . Sexual activity: Yes  Other Topics Concern  . Not on file  Social History Narrative    Lives alone in [place].   Back at school, studying Public relations account executive. Unemployed.    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     REVIEW OF SYSTEMS: Constitutional: Feels kind of weak and tired. ENT:  No nose bleeds Pulm: Has a fair amount of chronic shortness of breath which is not worse.  No productive cough. CV:  No palpitations, no LE edema.  No chest pain GU:  No hematuria, no frequency GI:  Per hpi Heme:  Per HPI   Transfusions:  Per HPI Neuro: Yesterday, while driving to the hospital he had an episode of blurry vision which is not normal for him.  No headaches, no peripheral tingling or numbness Derm:  No itching, no rash or sores.  Endocrine:  + chills.  No polyuria or dysuria Immunization: Did not ask about recent vaccinations. Travel:  None beyond local counties in last few months.    PHYSICAL EXAM: Vital signs in last 24 hours: Vitals:   04/21/17 0809 04/21/17 1005  BP:  103/70  Pulse:  90  Resp: 11 14  Temp:  98.4 F (36.9 C)  SpO2: 94% 93%   Wt Readings from Last 3 Encounters:  04/20/17 131 lb 9.8 oz (59.7 kg)  04/07/17 125 lb (56.7 kg)  02/27/17 120 lb (54.4 kg)    General: Thin, comfortable, anxious, nontoxic-appearing AAM Head: No facial asymmetry or swelling. Eyes: No scleral icterus.  No conjunctival pallor. Ears: Not hard of hearing. Nose: No congestion or discharge. Mouth: Moist, clear, pink oral mucosa.  Tongue midline. Neck: No JVD, no masses, no thyromegaly. Lungs: Clear bilaterally.  No labored breathing or cough. Heart: RRR.  No MRG.  S1, S2 present Abdomen: Thin, active bowel sounds.   Nondistended.  Tenderness in the lower mid abdomen and all along the right side.  No guarding or rebound.  No masses or HSM.  No bruits..   Rectal: Deferred Musc/Skeltl: No gross joint deformities, swelling or redness. Extremities: No CCE. Neurologic: Oriented x3.  Moves all 4 limbs.  No tremors.  No obvious deficits or weakness. Skin: No rashes or sores. Tattoos: Multiple tattoos in multiple areas of his body. Nodes: No cervical or inguinal adenopathy. Psych: Cooperative, pleasant.  Anxious and a little bit agitated but reasonable.  Intake/Output from previous day: 03/08 0701 - 03/09 0700 In: 1463.3 [P.O.:240; I.V.:1173.3; IV Piggyback:50] Out: 1201 [Urine:1200; Stool:1] Intake/Output this shift: Total I/O In: 480 [P.O.:480] Out: 350 [Urine:350]  LAB RESULTS: Recent Labs    04/19/17 2035 04/20/17 0413 04/21/17 1019  WBC 18.0* 17.5* 16.2*  HGB 5.5* 7.2* 7.7*  HCT 15.6* 21.5* 21.8*  PLT 140* 142* 127*   BMET Lab Results  Component Value Date   NA 138 04/21/2017   NA 139 04/20/2017   NA 141 04/19/2017   K 4.2 04/21/2017   K 3.9 04/20/2017   K 3.6 04/19/2017   CL 111 04/21/2017   CL 113 (H) 04/20/2017   CL 108 04/19/2017   CO2 22 04/21/2017   CO2 20 (L) 04/20/2017   CO2 25 04/19/2017   GLUCOSE 104 (H) 04/21/2017   GLUCOSE 94 04/20/2017   GLUCOSE 105 (H) 04/19/2017   BUN 16 04/21/2017   BUN 16 04/20/2017   BUN 18 04/19/2017   CREATININE 3.10 (H) 04/21/2017   CREATININE 2.18 (H) 04/20/2017   CREATININE 2.38 (H) 04/19/2017   CALCIUM 7.6 (L) 04/21/2017   CALCIUM 7.2 (L) 04/20/2017   CALCIUM 7.0 (L) 04/19/2017  LFT Recent Labs    04/19/17 2035 04/21/17 1019  PROT 4.4* 4.3*  ALBUMIN 1.3* 1.3*  AST 80* 73*  ALT 35 29  ALKPHOS 197* 184*  BILITOT 2.2* 2.5*  BILIDIR  --  0.7*  IBILI  --  1.8*   PT/INR No results found for: INR, PROTIME Hepatitis Panel No results for input(s): HEPBSAG, HCVAB, HEPAIGM, HEPBIGM in the last 72 hours. C-Diff No components  found for: CDIFF Lipase     Component Value Date/Time   LIPASE 42 08/01/2016 1849    Drugs of Abuse     Component Value Date/Time   LABOPIA POSITIVE (A) 02/27/2017 2041   COCAINSCRNUR NONE DETECTED 02/27/2017 2041   COCAINSCRNUR POS (A) 01/22/2013 1121   LABBENZ NONE DETECTED 02/27/2017 2041   LABBENZ NEG 01/22/2013 1121   AMPHETMU NONE DETECTED 02/27/2017 2041   THCU POSITIVE (A) 02/27/2017 2041   LABBARB NONE DETECTED 02/27/2017 2041     RADIOLOGY STUDIES: Ct Renal Stone Study  Result Date: 04/19/2017 CLINICAL DATA:  Flank pain. Stone disease suspected. Diarrhea and abdominal discomfort. Recent hospital admission for colitis. No improvement, now with weakness. EXAM: CT ABDOMEN AND PELVIS WITHOUT CONTRAST TECHNIQUE: Multidetector CT imaging of the abdomen and pelvis was performed following the standard protocol without IV contrast. COMPARISON:  CT 04/07/2017 FINDINGS: Lower chest: Bibasilar scarring with slight improved lung aeration from prior exam. Trace bilateral pleural thickening. Decreased density of the blood pool consistent with anemia. Hepatobiliary: No focal hepatic lesion. Clips in the gallbladder fossa postcholecystectomy. No biliary dilatation. Pancreas: No ductal dilatation. No definite peripancreatic fat stranding, however mild generalized edema of the upper mesentery. Spleen: Tiny calcified spleen, sequela of sickle cell, unchanged. Adrenals/Urinary Tract: Normal adrenal glands. Bilateral renal parenchymal atrophy, unchanged from prior exam. There is prominence of the left greater than right renal collecting systems and proximal ureter without calcified stone. Urinary bladder is distended without wall thickening. No calcified urolithiasis. Stomach/Bowel: Persistent and slight worsening colonic wall thickening of the ascending and transverse colon, now with involvement of the mid and proximal descending colon. No evidence of perforation. Possible wall thickening of the duodenum,  suboptimally assessed without enteric contrast and presence of intra-abdominal ascites. No other small bowel inflammation. Normal appendix. Vascular/Lymphatic: Innumerable central mesenteric nodes that are not enlarged by size criteria. Decreased blood pool density consistent with anemia. Reproductive: Prostate is unremarkable. Other: Increased abdominopelvic ascites from prior, small to moderate. Increased mesenteric edema. No free air. No evidence of intra-abdominal abscess. Musculoskeletal: Diffuse osteosclerosis consistent with history of sickle cell. Multiple small endplate depressions throughout spine. No acute finding. IMPRESSION: 1. Persistent/worsening colitis with colonic wall thickening of the ascending and transverse colon, new involvement of the descending colon. Possible new wall thickening of the duodenum. Findings may be infectious or inflammatory. 2. Increased abdominopelvic ascites. 3. Prominence of bilateral renal collecting systems without urolithiasis. This may be secondary to urinary bladder distention. Electronically Signed   By: Jeb Levering M.D.   On: 04/19/2017 23:43    ENDOSCOPIC STUDIES: Has never undergone colonoscopy or endoscopy or seen a GI specialist.  IMPRESSION:   *   Evolving colitis over 2 weeks. CT also now showing duodenal inflammation.   Suspect ischemic versus infectious.  Day 2 empiric Zosyn.       *  SS anemia, frequent crisis. Hx multiple RBC transfusions.  Required PRBC overnight  *   AKI, baseline stage 2/3 CKD  *   Thrombocytopenia.     PLAN:     *  Continue empiric antibiotics in case this is infectious.  *    If patient does not progress and improve, he will need to undergo colonoscopy  *   Regular diet is ordered and he can continue this along as he tolerates it.  *   We ordered the C. difficile study as it does not look like it had been sent.  Also ordered stool pathogen panel.  Sickle cell patients are at high risk for salmonella  infection.  Contact precautions in place until stool studies resulted.  Azucena Freed  04/21/2017, 11:32 AM Pager: [phone]

## 2017-04-22 DIAGNOSIS — R1011 Right upper quadrant pain: Secondary | ICD-10-CM

## 2017-04-22 DIAGNOSIS — K760 Fatty (change of) liver, not elsewhere classified: Secondary | ICD-10-CM

## 2017-04-22 DIAGNOSIS — R188 Other ascites: Secondary | ICD-10-CM

## 2017-04-22 LAB — PROTEIN, URINE, 24 HOUR
Collection Interval-UPROT: 24 h
Protein, 24H Urine: 15600 mg/d — ABNORMAL HIGH (ref 50–100)
Protein, Urine: 400 mg/dL
Urine Total Volume-UPROT: 3900 mL

## 2017-04-22 LAB — BASIC METABOLIC PANEL
Anion gap: 8 (ref 5–15)
BUN: 18 mg/dL (ref 6–20)
CHLORIDE: 110 mmol/L (ref 101–111)
CO2: 18 mmol/L — ABNORMAL LOW (ref 22–32)
CREATININE: 3.43 mg/dL — AB (ref 0.61–1.24)
Calcium: 7.5 mg/dL — ABNORMAL LOW (ref 8.9–10.3)
GFR calc Af Amer: 25 mL/min — ABNORMAL LOW (ref >60)
GFR calc non Af Amer: 22 mL/min — ABNORMAL LOW (ref >60)
GLUCOSE: 94 mg/dL (ref 65–99)
Potassium: 4.6 mmol/L (ref 3.5–5.1)
SODIUM: 136 mmol/L (ref 135–145)

## 2017-04-22 LAB — PHOSPHORUS: PHOSPHORUS: 4.1 mg/dL (ref 2.5–4.6)

## 2017-04-22 MED ORDER — POLYETHYLENE GLYCOL 3350 17 GM/SCOOP PO POWD: 1.0000 | Freq: Once | ORAL | Status: DC

## 2017-04-22 MED ORDER — BISACODYL 5 MG PO TBEC
5.0000 mg | DELAYED_RELEASE_TABLET | Freq: Once | ORAL | Status: DC
  Filled 2017-04-22: qty 1

## 2017-04-22 MED ORDER — BISACODYL 5 MG PO TBEC
5.0000 mg | DELAYED_RELEASE_TABLET | Freq: Once | ORAL | Status: AC
  Administered 2017-04-22: 5 mg via ORAL
  Filled 2017-04-22: qty 1

## 2017-04-22 MED ORDER — POLYETHYLENE GLYCOL 3350 17 GM/SCOOP PO POWD
0.5000 | Freq: Once | ORAL | Status: AC
  Administered 2017-04-22: 127.5 g via ORAL
  Filled 2017-04-22: qty 255

## 2017-04-22 MED ORDER — POLYETHYLENE GLYCOL 3350 17 GM/SCOOP PO POWD
0.5000 | Freq: Once | ORAL | Status: AC
  Administered 2017-04-23: 127.5 g via ORAL
  Filled 2017-04-22 (×2): qty 255

## 2017-04-22 MED ORDER — SENNOSIDES-DOCUSATE SODIUM 8.6-50 MG PO TABS
1.0000 | ORAL_TABLET | Freq: Two times a day (BID) | ORAL | Status: DC
  Administered 2017-04-22 – 2017-05-01 (×3): 1 via ORAL
  Filled 2017-04-22 (×16): qty 1

## 2017-04-22 MED ORDER — SODIUM CHLORIDE 0.9 % IV BOLUS (SEPSIS)
1500.0000 mL | Freq: Once | INTRAVENOUS | Status: AC
  Administered 2017-04-22: 1500 mL via INTRAVENOUS

## 2017-04-22 NOTE — Progress Notes (Signed)
Patient ID: [name] [name]., male   DOB: [birth year], 37 y.o.   MRN: [id number]   Spoke with his mother as requested by patient. And updated her on his condition. Mother still very angry about his previous hospitalization. Pt 's mother reports that on the day of admission prior to coming to the ED, he became lightheaded and his vision became dark while driving. Pt did not give this information at admission. Will speak with patient further tomorrow to further develop the relevance to his current clinical condition.  MATTHEWS,[name]

## 2017-04-22 NOTE — Progress Notes (Signed)
SICKLE CELL SERVICE PROGRESS NOTE  [name] [name]. CVE:[id number] DOB: 1980-09-20 DOA: 04/19/2017 PCP: [name] Hey, MD  Assessment/Plan: Principal Problem:   Sickle cell pain crisis ([place]) Active Problems:   CHRONIC KIDNEY DISEASE STAGE II (MILD)   Colitis   Nausea vomiting and diarrhea  1. Worsening Renal Function in setting of CKD: Renal function has worsened since yesterday despite IVF. Pt continues to have urine output. I suspect that it is multifactorial owing to nephrotoxic medications (NSAID), Sickle Cell Disease and volume depletion. I have asked Nephrology to see in consultation. Check lipids tomorrow.  2. Moderate Ascites: Will continue Zosyn for presumed SBP. Obtain diagnostic paracentesis by IR tomorrow. This may also be associated with worsening renal function.   3. Acute Colitis: It is not clear that the colitis is infectious. He may have an ischemic component. Will ask GI to see in consult. I do not think think that the Colitis is infectious but may rather be ischemic. I feel that he may require a colonoscopy. Continue Zosyn. Diarrhea resolved.  4. Hb SS with Crisis: Continue PCA bolus at 0.5 mg. Pt unable to receive NSAID's due to AKI. Continue IVF.  5. Thrombocytopenia: Etiology unclear. Likely multifactorial. Also patient on Zosyn which can lead to thrombocytopenia.  6. Leukocytosis: This is a feature of sickle cell crisis. However it may also be related to the abdominal process. However a review of his records show that he has chronically elevated WBC.  7. Chronic Pain Syndrome: Pt takes about 20 mg of Oxycodone daily.  8. Chronic Metabolic Acidosis: Pt takes Sodium Bicarbonate on a chronic basis. Serum bicarbonate normal. Continue.   Code Status: Full Code Family Communication: N/A Disposition Plan: Not yet ready for discharge  [place].  Pager [phone]. If 7PM-7AM, please contact night-coverage.  04/22/2017, 2:46 PM  LOS: 2 days   Interim  History: Pt has had no vomiting or diarrhea since admission. Thus I have discontinued enteric precautions. He still has pain in the RUQ both anteriorly and posteriorly at an intensity of 8/10. He has used 18.4 mg of Dilaudid with 84/63:demands/deliveries in the last 24 hours.  Pt very hostile and angry initially however he eventually engaged in a proper discussion about his condition. His mother has called requesting that I call her. He initially stated that I didn't need to call his mother. However by the end of the visit he did ask that I call and update his mother . His nurse Legrand Rams was present for most of the encounter and was present to witness the teach-back and the permission to call his mother and update her on his condition.   Consultants:  Gastroenterology- Dr. Hilarie Fredrickson  Nephrology- Dr. Jonnie Finner  Procedures:  None  Antibiotics:  Ciprofloxacin 3/7  Zosyn 3/8 >>   Objective: Vitals:   04/22/17 0751 04/22/17 1000 04/22/17 1222 04/22/17 1348  BP:  105/68  112/72  Pulse:  84  91  Resp: 12 12 12 12   Temp:  98 F (36.7 C)  98.6 F (37 C)  TempSrc:  Oral  Oral  SpO2: 93% 95% 93% 95%  Weight:      Height:       Weight change:   Intake/Output Summary (Last 24 hours) at 04/22/2017 1446 Last data filed at 04/22/2017 1032 Gross per 24 hour  Intake 2863.33 ml  Output 2950 ml  Net -86.67 ml     Physical Exam General: Alert, awake, oriented x3, in no apparent distress.  HEENT: [place]/AT PEERL, EOMI,  mild icterus. Neck: Trachea midline,  no masses, no thyromegal,y no JVD, no carotid bruit OROPHARYNX:  Moist, No exudate/ erythema/lesions.  Heart: Refused examination.   Lungs: Clear to auscultation, no wheezing or rhonchi noted. No increased vocal fremitus resonant to percussion  Abdomen: Soft, mild RUQ tenderness and tenderness in the right flank region. Abdomen is mildly distended, positive bowel sounds, no masses noted.  Neuro: No focal neurological deficits noted cranial nerves  II through XII grossly intact.  Strength at baseline in bilateral upper and lower extremities. Musculoskeletal: No warmth swelling or erythema around joints, no spinal tenderness noted. Psychiatric: Patient alert and oriented x 3,  good recent to remote recall.    Data Reviewed: Basic Metabolic Panel: Recent Labs  Lab 04/19/17 2035 04/20/17 0413 04/21/17 1019 04/21/17 1917 04/22/17 0801  NA 141 139 138 137 136  K 3.6 3.9 4.2 4.4 4.6  CL 108 113* 111 110 110  CO2 25 20* 22 20* 18*  GLUCOSE 105* 94 104* 103* 94  BUN 18 16 16 17 18   CREATININE 2.38* 2.18* 3.10* 3.33* 3.43*  CALCIUM 7.0* 7.2* 7.6* 7.4* 7.5*  PHOS  --   --   --   --  4.1   Liver Function Tests: Recent Labs  Lab 04/19/17 2035 04/21/17 1019  AST 80* 73*  ALT 35 29  ALKPHOS 197* 184*  BILITOT 2.2* 2.5*  PROT 4.4* 4.3*  ALBUMIN 1.3* 1.3*   No results for input(s): LIPASE, AMYLASE in the last 168 hours. No results for input(s): AMMONIA in the last 168 hours. CBC: Recent Labs  Lab 04/19/17 2035 04/20/17 0413 04/21/17 1019  WBC 18.0* 17.5* 16.2*  NEUTROABS 8.3* 8.4* 8.2*  HGB 5.5* 7.2* 7.7*  HCT 15.6* 21.5* 21.8*  MCV 102.0* 97.7 97.3  PLT 140* 142* 127*   Cardiac Enzymes: No results for input(s): CKTOTAL, CKMB, CKMBINDEX, TROPONINI in the last 168 hours. BNP (last 3 results) No results for input(s): BNP in the last 8760 hours.  ProBNP (last 3 results) No results for input(s): PROBNP in the last 8760 hours.  CBG: No results for input(s): GLUCAP in the last 168 hours.  No results found for this or any previous visit (from the past 240 hour(s)).   Studies: Dg Chest Port 1 View  Result Date: 04/07/2017 CLINICAL DATA:  Sickle cell pain crisis.  Right flank pain. EXAM: PORTABLE CHEST 1 VIEW COMPARISON:  02/27/2017 FINDINGS: The heart is normal in size allowing for AP technique and low lung volumes. Lungs are under aerated with bibasilar atelectasis. No pneumothorax or pleural effusion. There are  sclerotic changes and articular surface collapse involving the right humeral head consistent with avascular necrosis and bone infarct. IMPRESSION: Bibasilar atelectasis. Bony changes related to sickle cell disorder and bone infarct. Electronically Signed   By: Marybelle Killings M.D.   On: 04/07/2017 14:03   Ct Renal Stone Study  Result Date: 04/19/2017 CLINICAL DATA:  Flank pain. Stone disease suspected. Diarrhea and abdominal discomfort. Recent hospital admission for colitis. No improvement, now with weakness. EXAM: CT ABDOMEN AND PELVIS WITHOUT CONTRAST TECHNIQUE: Multidetector CT imaging of the abdomen and pelvis was performed following the standard protocol without IV contrast. COMPARISON:  CT 04/07/2017 FINDINGS: Lower chest: Bibasilar scarring with slight improved lung aeration from prior exam. Trace bilateral pleural thickening. Decreased density of the blood pool consistent with anemia. Hepatobiliary: No focal hepatic lesion. Clips in the gallbladder fossa postcholecystectomy. No biliary dilatation. Pancreas: No ductal dilatation. No definite peripancreatic fat stranding, however  mild generalized edema of the upper mesentery. Spleen: Tiny calcified spleen, sequela of sickle cell, unchanged. Adrenals/Urinary Tract: Normal adrenal glands. Bilateral renal parenchymal atrophy, unchanged from prior exam. There is prominence of the left greater than right renal collecting systems and proximal ureter without calcified stone. Urinary bladder is distended without wall thickening. No calcified urolithiasis. Stomach/Bowel: Persistent and slight worsening colonic wall thickening of the ascending and transverse colon, now with involvement of the mid and proximal descending colon. No evidence of perforation. Possible wall thickening of the duodenum, suboptimally assessed without enteric contrast and presence of intra-abdominal ascites. No other small bowel inflammation. Normal appendix. Vascular/Lymphatic: Innumerable  central mesenteric nodes that are not enlarged by size criteria. Decreased blood pool density consistent with anemia. Reproductive: Prostate is unremarkable. Other: Increased abdominopelvic ascites from prior, small to moderate. Increased mesenteric edema. No free air. No evidence of intra-abdominal abscess. Musculoskeletal: Diffuse osteosclerosis consistent with history of sickle cell. Multiple small endplate depressions throughout spine. No acute finding. IMPRESSION: 1. Persistent/worsening colitis with colonic wall thickening of the ascending and transverse colon, new involvement of the descending colon. Possible new wall thickening of the duodenum. Findings may be infectious or inflammatory. 2. Increased abdominopelvic ascites. 3. Prominence of bilateral renal collecting systems without urolithiasis. This may be secondary to urinary bladder distention. Electronically Signed   By: Jeb Levering M.D.   On: 04/19/2017 23:43   Ct Renal Stone Study  Result Date: 04/07/2017 CLINICAL DATA:  Right-sided flank pain beginning today. Sickle cell disease. EXAM: CT ABDOMEN AND PELVIS WITHOUT CONTRAST TECHNIQUE: Multidetector CT imaging of the abdomen and pelvis was performed following the standard protocol without IV contrast. COMPARISON:  None. FINDINGS: Lower chest: Bibasilar scarring. Asymmetric opacity in left lung base may be due to atelectasis or infiltrate. Hepatobiliary: No mass visualized on this unenhanced exam. Prior cholecystectomy. No evidence of biliary obstruction. Pancreas: No mass or inflammatory process visualized on this unenhanced exam. Spleen: Tiny calcified spleen, consistent with auto splenectomy of sickle cell disease. Adrenals/Urinary tract: Bilateral renal parenchymal atrophy. No evidence of urolithiasis or hydronephrosis. Unremarkable unopacified urinary bladder. Stomach/Bowel: Mild to moderate wall thickening is seen involving the ascending and transverse colon, consistent with colitis. No  evidence of bowel obstruction or abscess. Mild ascites noted. Vascular/Lymphatic: No pathologically enlarged lymph nodes identified. No evidence of abdominal aortic aneurysm. Reproductive:  No mass or other significant abnormality. Other:  None. Musculoskeletal: No suspicious bone lesions identified. Diffuse osteosclerosis, consistent with sickle cell disease. IMPRESSION: Colitis involving the ascending and transverse colon.  Mild ascites. No evidence of urolithiasis or hydronephrosis. Mild atelectasis or scarring in left lung base. Diffuse osteosclerosis and auto-splenectomy, consistent with chronic sickle cell disease. Electronically Signed   By: Earle Gell M.D.   On: 04/07/2017 15:56    Scheduled Meds: . bisacodyl  5 mg Oral Once  . folic acid  1 mg Oral Daily  . HYDROmorphone   Intravenous Q4H  . polyethylene glycol powder  0.5 Container Oral Once   Followed by  . [START ON 04/23/2017] polyethylene glycol powder  0.5 Container Oral Once  . senna-docusate  1 tablet Oral BID  . sodium bicarbonate  650 mg Oral TID   Continuous Infusions: . sodium chloride 125 mL/hr at 04/22/17 0754  . piperacillin-tazobactam (ZOSYN)  IV 3.375 g (04/22/17 1225)    Principal Problem:   Sickle cell pain crisis ([facility]) Active Problems:   CHRONIC KIDNEY DISEASE STAGE II (MILD)   Colitis   Nausea vomiting and diarrhea  In excess of 35 minutes spent during this visit. Greater than 50% involved face to face contact with the patient for assessment, counseling and coordination of care.

## 2017-04-22 NOTE — H&P (View-Only) (Signed)
Daily Rounding Note  04/22/2017, 10:04 AM  LOS: 2 days   SUBJECTIVE:   Chief complaint: Pain, still mostly in right flank.  Has difficulty verbalizing how often pain is severe or if it is better/worse/the same.  On PCA Dilaudid but not able to gauge from [facility] when/how much he is using.   No n/v.  Eating 40 to 100% of meals  Stool for C diff and pathogen panel not yet collected.    OBJECTIVE:         Vital signs in last 24 hours:    Temp:  [97.7 F (36.5 C)-98.9 F (37.2 C)] 98 F (36.7 C) (03/10 0421) Pulse Rate:  [89-100] 89 (03/10 0421) Resp:  [10-17] 12 (03/10 0751) BP: (103-123)/(67-75) 108/75 (03/10 0421) SpO2:  [93 %-99 %] 93 % (03/10 0751) Weight:  [123 lb 7.3 oz (56 kg)] 123 lb 7.3 oz (56 kg) (03/10 0421) Last BM Date: 04/20/17 Filed Weights   04/20/17 0250 04/22/17 0421  Weight: 131 lb 9.8 oz (59.7 kg) 123 lb 7.3 oz (56 kg)   General: arousable, comfortable, not speaking much   Heart: RRR Chest: clear bil.  No labored speach Abdomen: soft, ND.  Active BS.  Mild tendetness in right and left lower, right upper quadrant.    Extremities: no CCE Neuro/Psych:  Oriented x 3.    Intake/Output from previous day: 03/09 0701 - 03/10 0700 In: 3753.3 [P.O.:1320; I.V.:2283.3; IV Piggyback:150] Out: 3150 [Urine:3150]  Intake/Output this shift: No intake/output data recorded.  Lab Results: Recent Labs    04/19/17 2035 04/20/17 0413 04/21/17 1019  WBC 18.0* 17.5* 16.2*  HGB 5.5* 7.2* 7.7*  HCT 15.6* 21.5* 21.8*  PLT 140* 142* 127*   BMET Recent Labs    04/21/17 1019 04/21/17 1917 04/22/17 0801  NA 138 137 136  K 4.2 4.4 4.6  CL 111 110 110  CO2 22 20* 18*  GLUCOSE 104* 103* 94  BUN 16 17 18   CREATININE 3.10* 3.33* 3.43*  CALCIUM 7.6* 7.4* 7.5*   LFT Recent Labs    04/19/17 2035 04/21/17 1019  PROT 4.4* 4.3*  ALBUMIN 1.3* 1.3*  AST 80* 73*  ALT 35 29  ALKPHOS 197* 184*  BILITOT 2.2* 2.5*    BILIDIR  --  0.7*  IBILI  --  1.8*    Scheduled Meds: . folic acid  1 mg Oral Daily  . HYDROmorphone   Intravenous Q4H  . senna-docusate  1 tablet Oral BID  . sodium bicarbonate  650 mg Oral TID   Continuous Infusions: . sodium chloride 125 mL/hr at 04/22/17 0754  . piperacillin-tazobactam (ZOSYN)  IV Stopped (04/22/17 0752)   PRN Meds:.naloxone **AND** sodium chloride flush   ASSESMENT:   *   Evolving colitis over 2 weeks. CT also now showing duodenal inflammation.   Suspect ischemic versus infectious.  Given only 1 BM yesterday, infectious seems less likely.  Day 3 empiric Zosyn.  Says he had 1 soft BM yesterday.  Stool yet to be sent for C diff/pathogen panel.     *  SS anemia, frequent crisis. Hx multiple RBC transfusions.  Required PRBC overnight.    *   AKI, baseline stage 2/3 CKD.    *  Abdominal ascites.  ? Due to hypoproteinemia. ? Secondary to renal disease, ? Nephrotic syndrome.  24 hour urine protein collection in progress.   *  Protein malnutrition.    *  Fatty liver, LFTs chronically  elevated.    *   Thrombocytopenia.      PLAN   *  If continues not to have BMs, can probably d/c order for stool studies and contact precautions.      Azucena Freed  04/22/2017, 10:04 AM Pager: [phone]

## 2017-04-22 NOTE — Progress Notes (Signed)
Daily Rounding Note  04/22/2017, 10:04 AM  LOS: 2 days   SUBJECTIVE:   Chief complaint: Pain, still mostly in right flank.  Has difficulty verbalizing how often pain is severe or if it is better/worse/the same.  On PCA Dilaudid but not able to gauge from [facility] when/how much he is using.   No n/v.  Eating 40 to 100% of meals  Stool for C diff and pathogen panel not yet collected.    OBJECTIVE:         Vital signs in last 24 hours:    Temp:  [97.7 F (36.5 C)-98.9 F (37.2 C)] 98 F (36.7 C) (03/10 0421) Pulse Rate:  [89-100] 89 (03/10 0421) Resp:  [10-17] 12 (03/10 0751) BP: (103-123)/(67-75) 108/75 (03/10 0421) SpO2:  [93 %-99 %] 93 % (03/10 0751) Weight:  [123 lb 7.3 oz (56 kg)] 123 lb 7.3 oz (56 kg) (03/10 0421) Last BM Date: 04/20/17 Filed Weights   04/20/17 0250 04/22/17 0421  Weight: 131 lb 9.8 oz (59.7 kg) 123 lb 7.3 oz (56 kg)   General: arousable, comfortable, not speaking much   Heart: RRR Chest: clear bil.  No labored speach Abdomen: soft, ND.  Active BS.  Mild tendetness in right and left lower, right upper quadrant.    Extremities: no CCE Neuro/Psych:  Oriented x 3.    Intake/Output from previous day: 03/09 0701 - 03/10 0700 In: 3753.3 [P.O.:1320; I.V.:2283.3; IV Piggyback:150] Out: 3150 [Urine:3150]  Intake/Output this shift: No intake/output data recorded.  Lab Results: Recent Labs    04/19/17 2035 04/20/17 0413 04/21/17 1019  WBC 18.0* 17.5* 16.2*  HGB 5.5* 7.2* 7.7*  HCT 15.6* 21.5* 21.8*  PLT 140* 142* 127*   BMET Recent Labs    04/21/17 1019 04/21/17 1917 04/22/17 0801  NA 138 137 136  K 4.2 4.4 4.6  CL 111 110 110  CO2 22 20* 18*  GLUCOSE 104* 103* 94  BUN 16 17 18   CREATININE 3.10* 3.33* 3.43*  CALCIUM 7.6* 7.4* 7.5*   LFT Recent Labs    04/19/17 2035 04/21/17 1019  PROT 4.4* 4.3*  ALBUMIN 1.3* 1.3*  AST 80* 73*  ALT 35 29  ALKPHOS 197* 184*  BILITOT 2.2* 2.5*    BILIDIR  --  0.7*  IBILI  --  1.8*    Scheduled Meds: . folic acid  1 mg Oral Daily  . HYDROmorphone   Intravenous Q4H  . senna-docusate  1 tablet Oral BID  . sodium bicarbonate  650 mg Oral TID   Continuous Infusions: . sodium chloride 125 mL/hr at 04/22/17 0754  . piperacillin-tazobactam (ZOSYN)  IV Stopped (04/22/17 0752)   PRN Meds:.naloxone **AND** sodium chloride flush   ASSESMENT:   *   Evolving colitis over 2 weeks. CT also now showing duodenal inflammation.   Suspect ischemic versus infectious.  Given only 1 BM yesterday, infectious seems less likely.  Day 3 empiric Zosyn.  Says he had 1 soft BM yesterday.  Stool yet to be sent for C diff/pathogen panel.     *  SS anemia, frequent crisis. Hx multiple RBC transfusions.  Required PRBC overnight.    *   AKI, baseline stage 2/3 CKD.    *  Abdominal ascites.  ? Due to hypoproteinemia. ? Secondary to renal disease, ? Nephrotic syndrome.  24 hour urine protein collection in progress.   *  Protein malnutrition.    *  Fatty liver, LFTs chronically  elevated.    *   Thrombocytopenia.      PLAN   *  If continues not to have BMs, can probably d/c order for stool studies and contact precautions.      Azucena Freed  04/22/2017, 10:04 AM Pager: [phone]

## 2017-04-22 NOTE — Consult Note (Signed)
Renal Service Consult Note [facility]  Sopheap Boehle 04/22/2017 Sol Blazing Requesting Physician:  Dr Zigmund Daniel   Reason for Consult:  Acute/ chronic renal failure HPI: The patient is a 37 y.o. year-old with hx of SCD, migraines, GERD, PNA, CKD who presented on 3/7 with c/o chest pain, abd pain, n/v/d, abd bloating, pain in flanks.  BP's in ED were soft, creat up from baseline, Hb 5.5 (usually 6-7). CT showed worsening of recently dx'd colitis.  Pt given 1 u prbc in ED and admitted for sickle cell crisis w/ possible colitis. ACE inhibitor was held on admit for ^creat, IVF's given.  CT was w/o contrast.   Creat was 2.39 on admission, improved to 2.18 but then the last 48 hrs creat is up to 3.1  yest and 3.43 this am.  Albumin is low at 1.3, UA has > 300 protein on dipstick, this was new with the Jan 2019 UA.  Renal US showed smaller kidneys than last [place] in 2016.  Abd CT showed fullness of bilat kidneys and large bladder w urinary retention.  Asked to see for acute on CRF.    Pt f/ bDr Florene Glen at [facility], seen twice.  Doesn't use nsaids.    ROS  no HA  no blurry vision  no rash  no dysuria  no difficulty voiding  no change in urine color   Past Medical History  Past Medical History:  Diagnosis Date  . Bacterial pneumonia ~ 2012   "caught it here in the hospital" (09/25/2012)  . Chronic kidney disease    "from my sickle cell" (09/25/2012)  . CKD (chronic kidney disease), stage II   . Cyst of brain    "2 really small ones in the back of my head; inside; saw them w/MRI" (09/25/2012)  . GERD (gastroesophageal reflux disease)    "after I eat alot of spicey foods" (09/25/2012)  . Gynecomastia, male 07/10/2012  . History of blood transfusion    "always related to sickle cell crisis" (09/25/2012)  . Migraines    "take RX qd to prevent them" (09/25/2012)  . Sickle cell anemia (HCC)   . Sickle cell crisis ([place]) 09/25/2012  . Sickle cell nephropathy ([place]) 07/10/2012  .  Sinus tachycardia   . Tachycardia with heart rate 121-140 beats per minute with ambulation 08/04/2016   Past Surgical History  Past Surgical History:  Procedure Laterality Date  . CHOLECYSTECTOMY  ~ 2012  . IR FLUORO GUIDE CV LINE RIGHT  12/17/2016  . IR REMOVAL TUN CV CATH W/O FL  12/21/2016  . IR US GUIDE VASC ACCESS RIGHT  12/17/2016  . spleenectomy     Family History  Family History  Problem Relation Age of Onset  . Breast cancer Mother    Social History  reports that he has been smoking cigarettes.  he has never used smokeless tobacco. He reports that he does not drink alcohol or use drugs. Allergies  Allergies  Allergen Reactions  . Nsaids   . Morphine And Related Other (See Comments)    "real bad headaches"   Home medications Prior to Admission medications   Medication Sig Start Date End Date Taking? Authorizing Provider  enalapril (VASOTEC) 5 MG tablet Take 5 mg by mouth daily. 12/09/16  Yes [provider]  folic acid (FOLVITE) 1 MG tablet Take 1 tablet (1 mg total) daily by mouth. 12/22/16  Yes Leana Gamer, MD  nortriptyline (PAMELOR) 25 MG capsule TAKE 1 CAPSULE (25  MG TOTAL) BY MOUTH AT BEDTIME. 01/18/17  Yes Donnamae Jude, MD  Oxycodone HCl 10 MG TABS Take 1 tablet (10 mg total) by mouth every 6 (six) hours as needed. 03/04/17  Yes [name], MD  sodium bicarbonate 650 MG tablet Take 1 tablet (650 mg total) 3 (three) times daily by mouth. 12/21/16  Yes Leana Gamer, MD  Menthol-Methyl Salicylate (MUSCLE RUB) 10-15 % CREA Apply 1 application as needed topically for muscle pain. Patient not taking: Reported on 04/19/2017 12/21/16   Leana Gamer, MD   Liver Function Tests Recent Labs  Lab 04/19/17 2035 04/21/17 1019  AST 80* 73*  ALT 35 29  ALKPHOS 197* 184*  BILITOT 2.2* 2.5*  PROT 4.4* 4.3*  ALBUMIN 1.3* 1.3*   No results for input(s): LIPASE, AMYLASE in the last 168 hours. CBC Recent Labs  Lab 04/19/17 2035  04/20/17 0413 04/21/17 1019  WBC 18.0* 17.5* 16.2*  NEUTROABS 8.3* 8.4* 8.2*  HGB 5.5* 7.2* 7.7*  HCT 15.6* 21.5* 21.8*  MCV 102.0* 97.7 97.3  PLT 140* 142* 673*   Basic Metabolic Panel Recent Labs  Lab 04/19/17 2035 04/20/17 0413 04/21/17 1019 04/21/17 1917 04/22/17 0801  NA 141 139 138 137 136  K 3.6 3.9 4.2 4.4 4.6  CL 108 113* 111 110 110  CO2 25 20* 22 20* 18*  GLUCOSE 105* 94 104* 103* 94  BUN '18 16 16 17 18  ' CREATININE 2.38* 2.18* 3.10* 3.33* 3.43*  CALCIUM 7.0* 7.2* 7.6* 7.4* 7.5*  PHOS  --   --   --   --  4.1   Iron/TIBC/Ferritin/ %Sat    Component Value Date/Time   IRON 52 10/07/2009 0750   TIBC 228 10/07/2009 0750   FERRITIN 1338 (H) 10/07/2009 0750   IRONPCTSAT 23 10/07/2009 0750    Vitals:   04/22/17 1000 04/22/17 1222 04/22/17 1348 04/22/17 1641  BP: 105/68  112/72   Pulse: 84  91   Resp: '12 12 12 14  ' Temp: 98 F (36.7 C)  98.6 F (37 C)   TempSrc: Oral  Oral   SpO2: 95% 93% 95% 96%  Weight:      Height:       Exam Gen alert, on pca , nasal O2, small framed AAM looks younger than stated age No rash, cyanosis or gangrene Sclera anicteric, throat clear and slightly dry  No jvd or bruits Chest clear bilat RRR no MRG Abd soft ntnd no mass or ascites +bs GU normal male MS no joint effusions or deformity Ext no LE or UE edema, no wounds or ulcers Neuro is alert, Ox 3 , nf, no asterixis  Home meds: - vasotec 5 qd - sod bicarb 650 tid - pamelor 25 hs/ oxycodone 10 qid prn - folic acid 1 mg qd  Date  Creat  eGFR 2008- 10 0.8- 1.8 2011- 12 1.3- 1.8  2013- 14 1.8- 2.5 2015- 16 1.4- 2.2 2017  1.7- 2.3  2018  1.5- 2.4 Jan 19  1.6- 1.9 > 60  Feb 19  1.8- 2.8 32- 54 Mar 19  2.3 >> 3.4 25- 44    Kidney size  Right Left 2005  10.9 9.8 2009  11.2 9.8 2016  10.1 9.3 2018 (Sept) 9.2 8.6  UA 6/18 - neg protein, no rbc UA 9/18 - 30 protein, no rbc UA 1/19 - > 300 protein, no rub UA  2/19 - > 300 protein, 0-5 rbc UA  04/19/17 - > 300  protein, 0-5 rbc  CT abd 04/19/17 > Persistent/worsening colitis with colonic wall thickening of the ascending and transverse colon, new involvement of the descending colon. Possible new wall thickening of the duodenum. Findings may be infectious or inflammatory. 2. Increased abdominopelvic ascites. 3. Prominence of bilateral renal collecting systems without urolithiasis. This may be secondary to urinary bladder distention.  Impression: 1  Renal failure - acute on CKD 3/4.  BP's soft, home ACEi on hold here, still has room for volume. Not clear cause, UA ++protein , no rbcs to suggest GN, no wbc's either. Kidney size progressive decline as above. 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  Suspect GFR is worse than suggested by screening serum creatinine. Nothing systemic to suggest a second disease such as lupus.  Will get myeloma screens, quantitate urine protein. Agree w/ current IVF's, will give bolus as well. Avoid nsaids / acei/ arb of course.  Hypoalbuminemia is significant and reflects neph syndrome or liver dz or both.  Watch for signs of uremia.   2  Nausea/ vomiting/ diarrhea - "colitis" by CT, for colonscopy soon per GI 3  Sickle cell disease 4  Ascites     Plan - will follow  Kelly Splinter MD [facility] pager [phone]   04/22/2017, 5:13 PM

## 2017-04-22 NOTE — Progress Notes (Signed)
Patient ID: [name] [name]., male   DOB: 1981/01/21, 37 y.o.   MRN: [id number]  Pt reports that he has been having continued diarrhea. However the nurses report no diarrhea and when he did have stool it was formed.  Ante Arredondo A.

## 2017-04-23 ENCOUNTER — Inpatient Hospital Stay (HOSPITAL_COMMUNITY): Payer: Medicare Other | Admitting: Anesthesiology

## 2017-04-23 ENCOUNTER — Encounter (HOSPITAL_COMMUNITY)
Admission: EM | Disposition: A | Discharge status: Home / Self Care | Payer: Self-pay | Source: Home / Self Care | Attending: Internal Medicine

## 2017-04-23 ENCOUNTER — Encounter (HOSPITAL_COMMUNITY): Payer: Self-pay

## 2017-04-23 ENCOUNTER — Inpatient Hospital Stay (HOSPITAL_COMMUNITY): Payer: Medicare Other

## 2017-04-23 DIAGNOSIS — R935 Abnormal findings on diagnostic imaging of other abdominal regions, including retroperitoneum: Secondary | ICD-10-CM

## 2017-04-23 DIAGNOSIS — N058 Unspecified nephritic syndrome with other morphologic changes: Secondary | ICD-10-CM | POA: Diagnosis present

## 2017-04-23 HISTORY — PX: COLONOSCOPY: SHX5424

## 2017-04-23 LAB — BASIC METABOLIC PANEL
Anion gap: 8 (ref 5–15)
BUN: 25 mg/dL — AB (ref 6–20)
CALCIUM: 6.9 mg/dL — AB (ref 8.9–10.3)
CHLORIDE: 108 mmol/L (ref 101–111)
CO2: 18 mmol/L — ABNORMAL LOW (ref 22–32)
CREATININE: 4.52 mg/dL — AB (ref 0.61–1.24)
GFR, EST AFRICAN AMERICAN: 18 mL/min — AB (ref >60)
GFR, EST NON AFRICAN AMERICAN: 16 mL/min — AB (ref >60)
Glucose, Bld: 99 mg/dL (ref 65–99)
Potassium: 4.7 mmol/L (ref 3.5–5.1)
SODIUM: 134 mmol/L — AB (ref 135–145)

## 2017-04-23 LAB — PROTEIN / CREATININE RATIO, URINE
CREATININE, URINE: 21.87 mg/dL
PROTEIN CREATININE RATIO: 30.41 mg/mg{creat} — AB (ref 0.00–0.15)
Total Protein, Urine: 665 mg/dL

## 2017-04-23 LAB — TYPE AND SCREEN
ABO/RH(D): O POS
Antibody Screen: NEGATIVE
Donor AG Type: NEGATIVE
Donor AG Type: NEGATIVE
Donor AG Type: NEGATIVE
UNIT DIVISION: 0
Unit division: 0
Unit division: 0

## 2017-04-23 LAB — BPAM RBC
Blood Product Expiration Date: 201904012359
Blood Product Expiration Date: 201904032359
Blood Product Expiration Date: 201904032359
ISSUE DATE / TIME: 201903072326
UNIT TYPE AND RH: 5100
UNIT TYPE AND RH: 5100
Unit Type and Rh: 5100

## 2017-04-23 LAB — MISC LABCORP TEST (SEND OUT): LABCORP TEST CODE: 183480

## 2017-04-23 LAB — FRUCTOSAMINE: Fructosamine: 160 umol/L (ref 0–285)

## 2017-04-23 SURGERY — COLONOSCOPY
Anesthesia: Monitor Anesthesia Care

## 2017-04-23 MED ORDER — MEPERIDINE HCL 25 MG/ML IJ SOLN: 6.2500 mg | INTRAMUSCULAR | Status: DC | PRN

## 2017-04-23 MED ORDER — HYDROMORPHONE 1 MG/ML IV SOLN
INTRAVENOUS | Status: DC
  Administered 2017-04-23 (×2): 4 mg via INTRAVENOUS
  Administered 2017-04-24: 2.5 mg via INTRAVENOUS
  Administered 2017-04-24: 3.5 mg via INTRAVENOUS
  Administered 2017-04-24: 2 mg via INTRAVENOUS
  Administered 2017-04-24: 4 mg via INTRAVENOUS

## 2017-04-23 MED ORDER — PROPOFOL 500 MG/50ML IV EMUL
INTRAVENOUS | Status: DC | PRN
  Administered 2017-04-23: 100 ug/kg/min via INTRAVENOUS

## 2017-04-23 MED ORDER — DIPHENHYDRAMINE HCL 12.5 MG/5ML PO ELIX
12.5000 mg | ORAL_SOLUTION | Freq: Once | ORAL | Status: AC
  Administered 2017-04-23: 12.5 mg via ORAL
  Filled 2017-04-23: qty 5

## 2017-04-23 MED ORDER — PROPOFOL 10 MG/ML IV BOLUS
INTRAVENOUS | Status: AC
  Filled 2017-04-23: qty 60

## 2017-04-23 MED ORDER — PROPOFOL 10 MG/ML IV BOLUS
INTRAVENOUS | Status: DC | PRN
  Administered 2017-04-23: 20 mg via INTRAVENOUS
  Administered 2017-04-23: 40 mg via INTRAVENOUS
  Administered 2017-04-23 (×6): 20 mg via INTRAVENOUS

## 2017-04-23 MED ORDER — MIDAZOLAM HCL 2 MG/2ML IJ SOLN: 0.5000 mg | Freq: Once | INTRAMUSCULAR | Status: DC | PRN

## 2017-04-23 NOTE — Progress Notes (Signed)
SICKLE CELL SERVICE PROGRESS NOTE  [name] [name]. DTO:[id number] DOB: 02/14/1980 DOA: 04/19/2017 PCP: [name], MD  Assessment/Plan: Principal Problem:   Sickle cell pain crisis ([place]) Active Problems:   CHRONIC KIDNEY DISEASE STAGE II (MILD)   Acute renal failure superimposed on chronic kidney disease (HCC)   Colitis   Nausea vomiting and diarrhea   Other ascites   RUQ pain   Abnormal CT of the abdomen  1. Worsening Renal Function in setting of CKD: Renal function has worsened since yesterday despite IVF. Pt continues to have urine output. I suspect that it is multifactorial owing to nephrotoxic medications (NSAID), Sickle Cell Disease and volume depletion. I have asked Nephrology to see in consultation. Check lipids tomorrow.  2. Moderate Ascites: Will continue Zosyn for presumed SBP. Obtain diagnostic paracentesis by IR tomorrow. This may also be associated with worsening renal function.   3. Diarrhea: Findings on CT were consistent with Colitis. However colonoscopy today showed no findings consistent with colitis and the entire colon appeared normal. As was my original suspicion based on clinical course, I believe that the emesis and diarrhea are manifestation of opiate withdrawal.  4. Hb SS with Crisis: Continue PCA bolus at 0.5 mg. Pt unable to receive NSAID's due to AKI. Continue IVF.  5. Thrombocytopenia: Etiology unclear. Likely multifactorial. Also patient on Zosyn which can lead to thrombocytopenia.  6. Leukocytosis: This is a feature of sickle cell crisis. However it may also be related to the abdominal process. However a review of his records show that he has chronically elevated WBC.  7. Chronic Pain Syndrome: Pt reports that he  takes about 20 mg of Oxycodone daily.  8. Chronic Metabolic Acidosis: Pt takes Sodium Bicarbonate on a chronic basis. Serum bicarbonate normal. Continue.   Code Status: Full Code Family Communication: N/A Disposition Plan: Not yet  ready for discharge  [place].  Pager [phone]. If 7PM-7AM, please contact night-coverage.  04/23/2017, 12:53 PM  LOS: 3 days   Interim History: Pt has had no vomiting or diarrhea since admission. Thus I have discontinued enteric precautions. He has no significant pain but is resistant to weaning the PCA. Nevertheless I informed the patient and his mother present in the room that the PCA will be weaned and he will be transitioned to Oxycodone. He has used 15 mg of Dilaudid with 38/30:demands/deliveries in the last 24 hours.   Pt continues to be very hostile and angry towards me and blames the medical system for his condition. Pt's mother was present for the entire visit and I spent at least an hour explaining the patients current condition and  progress so far to both patient (who was very poorly engaged) and Mother. The patient appears to be in a state of avoidance about his condition and is laser focused on getting to an ambulatory appointment at [facility] on Thursday.  It appears that both mother and son have difficulty grasping information or understanding the information presented or the gravity of his current condition. Perhaps it is the stress of worry about the patients current condition but the mother asked the same questions at least 10 times and despite several explanations was unable to teach-bach information.   Neither the patient or his mother is unable to provide any information regarding his medical history yet he has an expectation that we should have his history from "the computer". I explained that there is no global history housed in a computer and made the suggestion that a list of  his medical conditions be kept up to date so that it can be shared with medical personal when he presents for care. 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  Regarding their concern for the appointment at [facility], I have  offered to communicate with [facility] about his hospitalization. I have advised that either  [facility] could request records regarding this hospitalization or they could request the records and take them with them to [facility].  I have advised the mother that I will speak with her tomorrow and try to update her on any new information.   Consultants:  Gastroenterology- Dr. Hilarie Fredrickson  Nephrology- Dr. Jonnie Finner, Dr. Joelyn Oms  IR  Procedures:  None  Antibiotics:  Ciprofloxacin 3/7  Zosyn 3/8 >>3/11   Objective: Vitals:   04/23/17 0858 04/23/17 1038 04/23/17 1040 04/23/17 1050  BP: 117/75 106/71 103/66 120/69  Pulse: (!) 102 (!) 102  95  Resp: 12 13 13 11   Temp: 98.4 F (36.9 C) 98.1 F (36.7 C)    TempSrc: Oral Oral    SpO2: 97% 97%  100%  Weight: 55.8 kg (123 lb)     Height: 5\' 3"  (1.6 m)      Weight change:   Intake/Output Summary (Last 24 hours) at 04/23/2017 1253 Last data filed at 04/23/2017 1030 Gross per 24 hour  Intake 3343.75 ml  Output 800 ml  Net 2543.75 ml     Physical Exam General: Alert, awake, oriented x3, in no apparent distress. Ambulating without difficulty. HEENT: [place]/AT PEERL, EOMI, anicteric Neck: Trachea midline,  no masses, no thyromegal,y no JVD, no carotid bruit OROPHARYNX:  Moist, No exudate/ erythema/lesions.  Heart: Regular rate and rhythm. Pt has a non-radiating  II/VI SEM at the 2nd intercostal space. PMI non-displaced, no heaves or thrills on palpation. Lungs: Clear to auscultation, no wheezing or rhonchi noted. No increased vocal fremitus resonant to percussion  Abdomen: Soft, non-tender. Abdomen is mildly distended, positive bowel sounds, no masses noted.  Neuro: No focal neurological deficits noted cranial nerves II through XII grossly intact.  Strength at baseline in bilateral upper and lower extremities. Musculoskeletal: No warmth swelling or erythema around joints, no spinal tenderness noted. Psychiatric: Patient alert and oriented x 3, remote recall  is impaired and given his accounts of events during the day, I question his recent recall also.    Data Reviewed: Basic Metabolic Panel: Recent Labs  Lab 04/19/17 2035 04/20/17 0413 04/21/17 1019 04/21/17 1917 04/22/17 0801  NA 141 139 138 137 136  K 3.6 3.9 4.2 4.4 4.6  CL 108 113* 111 110 110  CO2 25 20* 22 20* 18*  GLUCOSE 105* 94 104* 103* 94  BUN 18 16 16 17 18   CREATININE 2.38* 2.18* 3.10* 3.33* 3.43*  CALCIUM 7.0* 7.2* 7.6* 7.4* 7.5*  PHOS  --   --   --   --  4.1   Liver Function Tests: Recent Labs  Lab 04/19/17 2035 04/21/17 1019  AST 80* 73*  ALT 35 29  ALKPHOS 197* 184*  BILITOT 2.2* 2.5*  PROT 4.4* 4.3*  ALBUMIN 1.3* 1.3*   No results for input(s): LIPASE, AMYLASE in the last 168 hours. No results for input(s): AMMONIA in the last 168 hours. CBC: Recent Labs  Lab 04/19/17 2035 04/20/17 0413 04/21/17 1019  WBC 18.0* 17.5* 16.2*  NEUTROABS 8.3* 8.4* 8.2*  HGB 5.5* 7.2* 7.7*  HCT 15.6* 21.5* 21.8*  MCV 102.0*  97.7 97.3  PLT 140* 142* 127*   Cardiac Enzymes: No results for input(s): CKTOTAL, CKMB, CKMBINDEX, TROPONINI in the last 168 hours. BNP (last 3 results) No results for input(s): BNP in the last 8760 hours.  ProBNP (last 3 results) No results for input(s): PROBNP in the last 8760 hours.  CBG: No results for input(s): GLUCAP in the last 168 hours.  No results found for this or any previous visit (from the past 240 hour(s)).   Studies: Dg Chest Port 1 View  Result Date: 04/07/2017 CLINICAL DATA:  Sickle cell pain crisis.  Right flank pain. EXAM: PORTABLE CHEST 1 VIEW COMPARISON:  02/27/2017 FINDINGS: The heart is normal in size allowing for AP technique and low lung volumes. Lungs are under aerated with bibasilar atelectasis. No pneumothorax or pleural effusion. There are sclerotic changes and articular surface collapse involving the right humeral head consistent with avascular necrosis and bone infarct. IMPRESSION: Bibasilar atelectasis.  Bony changes related to sickle cell disorder and bone infarct. Electronically Signed   By: Marybelle Killings M.D.   On: 04/07/2017 14:03   Ct Renal Stone Study  Result Date: 04/19/2017 CLINICAL DATA:  Flank pain. Stone disease suspected. Diarrhea and abdominal discomfort. Recent hospital admission for colitis. No improvement, now with weakness. EXAM: CT ABDOMEN AND PELVIS WITHOUT CONTRAST TECHNIQUE: Multidetector CT imaging of the abdomen and pelvis was performed following the standard protocol without IV contrast. COMPARISON:  CT 04/07/2017 FINDINGS: Lower chest: Bibasilar scarring with slight improved lung aeration from prior exam. Trace bilateral pleural thickening. Decreased density of the blood pool consistent with anemia. Hepatobiliary: No focal hepatic lesion. Clips in the gallbladder fossa postcholecystectomy. No biliary dilatation. Pancreas: No ductal dilatation. No definite peripancreatic fat stranding, however mild generalized edema of the upper mesentery. Spleen: Tiny calcified spleen, sequela of sickle cell, unchanged. Adrenals/Urinary Tract: Normal adrenal glands. Bilateral renal parenchymal atrophy, unchanged from prior exam. There is prominence of the left greater than right renal collecting systems and proximal ureter without calcified stone. Urinary bladder is distended without wall thickening. No calcified urolithiasis. Stomach/Bowel: Persistent and slight worsening colonic wall thickening of the ascending and transverse colon, now with involvement of the mid and proximal descending colon. No evidence of perforation. Possible wall thickening of the duodenum, suboptimally assessed without enteric contrast and presence of intra-abdominal ascites. No other small bowel inflammation. Normal appendix. Vascular/Lymphatic: Innumerable central mesenteric nodes that are not enlarged by size criteria. Decreased blood pool density consistent with anemia. Reproductive: Prostate is unremarkable. Other: Increased  abdominopelvic ascites from prior, small to moderate. Increased mesenteric edema. No free air. No evidence of intra-abdominal abscess. Musculoskeletal: Diffuse osteosclerosis consistent with history of sickle cell. Multiple small endplate depressions throughout spine. No acute finding. IMPRESSION: 1. Persistent/worsening colitis with colonic wall thickening of the ascending and transverse colon, new involvement of the descending colon. Possible new wall thickening of the duodenum. Findings may be infectious or inflammatory. 2. Increased abdominopelvic ascites. 3. Prominence of bilateral renal collecting systems without urolithiasis. This may be secondary to urinary bladder distention. Electronically Signed   By: Jeb Levering M.D.   On: 04/19/2017 23:43   Ct Renal Stone Study  Result Date: 04/07/2017 CLINICAL DATA:  Right-sided flank pain beginning today. Sickle cell disease. EXAM: CT ABDOMEN AND PELVIS WITHOUT CONTRAST TECHNIQUE: Multidetector CT imaging of the abdomen and pelvis was performed following the standard protocol without IV contrast. COMPARISON:  None. FINDINGS: Lower chest: Bibasilar scarring. Asymmetric opacity in left lung base may be due to atelectasis  or infiltrate. Hepatobiliary: No mass visualized on this unenhanced exam. Prior cholecystectomy. No evidence of biliary obstruction. Pancreas: No mass or inflammatory process visualized on this unenhanced exam. Spleen: Tiny calcified spleen, consistent with auto splenectomy of sickle cell disease. Adrenals/Urinary tract: Bilateral renal parenchymal atrophy. No evidence of urolithiasis or hydronephrosis. Unremarkable unopacified urinary bladder. Stomach/Bowel: Mild to moderate wall thickening is seen involving the ascending and transverse colon, consistent with colitis. No evidence of bowel obstruction or abscess. Mild ascites noted. Vascular/Lymphatic: No pathologically enlarged lymph nodes identified. No evidence of abdominal aortic aneurysm.  Reproductive:  No mass or other significant abnormality. Other:  None. Musculoskeletal: No suspicious bone lesions identified. Diffuse osteosclerosis, consistent with sickle cell disease. IMPRESSION: Colitis involving the ascending and transverse colon.  Mild ascites. No evidence of urolithiasis or hydronephrosis. Mild atelectasis or scarring in left lung base. Diffuse osteosclerosis and auto-splenectomy, consistent with chronic sickle cell disease. Electronically Signed   By: Earle Gell M.D.   On: 04/07/2017 15:56    Scheduled Meds: . bisacodyl  5 mg Oral Once  . folic acid  1 mg Oral Daily  . HYDROmorphone   Intravenous Q4H  . senna-docusate  1 tablet Oral BID  . sodium bicarbonate  650 mg Oral TID   Continuous Infusions: . sodium chloride 125 mL/hr at 04/22/17 1842  . piperacillin-tazobactam (ZOSYN)  IV 3.375 g (04/23/17 8341)    Principal Problem:   Sickle cell pain crisis (HCC) Active Problems:   CHRONIC KIDNEY DISEASE STAGE II (MILD)   Acute renal failure superimposed on chronic kidney disease (HCC)   Colitis   Nausea vomiting and diarrhea   Other ascites   RUQ pain   Abnormal CT of the abdomen    In excess of 60 minutes spent during this visit. Greater than 50% involved face to face contact with the patient for assessment, counseling and coordination of care.

## 2017-04-23 NOTE — Progress Notes (Signed)
[name] [name]  refused to allow [place] PA to insert an IJ. Once [name] [name]  Returned from scheduled paracentesis, he  Decides he was ready to have the IJ placed. I called Pete" number . He did not r/t my call. I spoke with the charge RN on 2nd floor. She suggested I call Dr Elsworth Soho. Ipaged him at [phone]. He r/t my call and stated that he would not due the procedure

## 2017-04-23 NOTE — Interval H&P Note (Signed)
History and Physical Interval Note:  04/23/2017 10:02 AM  [name] A Asman Jr.  has presented today for surgery, with the diagnosis of colitis, right flank pain, sickle cell anemia  The various methods of treatment have been discussed with the patient and family. After consideration of risks, benefits and other options for treatment, the patient has consented to  Procedure(s): COLONOSCOPY (N/A) as a surgical intervention .  The patient's history has been reviewed, patient examined, no change in status, stable for surgery.  I have reviewed the patient's chart and labs.  Questions were answered to the patient's satisfaction.     Scarlette Shorts

## 2017-04-23 NOTE — Anesthesia Postprocedure Evaluation (Signed)
Anesthesia Post Note  Patient: [name] Vigen Gouin Jr.  Procedure(s) Performed: COLONOSCOPY (N/A )     Patient location during evaluation: Endoscopy Anesthesia Type: MAC Level of consciousness: awake and alert, patient cooperative and oriented Pain management: pain level controlled Vital Signs Assessment: post-procedure vital signs reviewed and stable Respiratory status: spontaneous breathing, nonlabored ventilation, respiratory function stable and patient connected to nasal cannula oxygen Cardiovascular status: blood pressure returned to baseline and stable Postop Assessment: no apparent nausea or vomiting Anesthetic complications: no    Last Vitals:  Vitals:   04/23/17 1040 04/23/17 1050  BP: 103/66 120/69  Pulse:  95  Resp: 13 11  Temp:    SpO2:  100%    Last Pain:  Vitals:   04/23/17 1057  TempSrc:   PainSc: 7                  Nihar Klus,E. Khaliel Morey

## 2017-04-23 NOTE — Progress Notes (Signed)
Pt presented to [place] dept today for paracentesis. On limited US abd in all four quadrants there is only a trace amount of ascites in RLQ which is not safely amenable to percutaneous access (bowel loops in close proximity) at this time. Procedure cancelled. Dr. Matthews/pt aware.

## 2017-04-23 NOTE — Progress Notes (Signed)
Admit: 04/19/2017 LOS: 3  70M Sickle Cell Anemia and CKD with AoCKD and Nephrotic Syndrome  Subjective:  CSY this AM, no abnormal findings No labs this AM  UP/C and 24h U Prot both nephrotic Says lots of bloating/swelling, swelling in testicles/penis, and edema in legs  03/10 0701 - 03/11 0700 In: 2593.8 [I.V.:2493.8; IV Piggyback:100] Out: 1100 [Urine:1100]  Filed Weights   04/20/17 0250 04/22/17 0421 04/23/17 0858  Weight: 59.7 kg (131 lb 9.8 oz) 56 kg (123 lb 7.3 oz) 55.8 kg (123 lb)    Scheduled Meds: . bisacodyl  5 mg Oral Once  . folic acid  1 mg Oral Daily  . HYDROmorphone   Intravenous Q4H  . senna-docusate  1 tablet Oral BID  . sodium bicarbonate  650 mg Oral TID   Continuous Infusions: . sodium chloride 125 mL/hr at 04/22/17 1842  . piperacillin-tazobactam (ZOSYN)  IV 3.375 g (04/23/17 0338)   PRN Meds:.naloxone **AND** sodium chloride flush  Current Labs: reviewed  Results for JAHDEN, SCHARA (MRN [id number]) as of 04/23/2017 12:46  Ref. Range 04/22/2017 18:47  Protein Creatinine Ratio Latest Ref Range: 0.00 - 0.15 mg/mgCre 30.41 (H)     Ref. Range 04/21/2017 15:17  Protein, 24H Urine Latest Ref Range: 50 - 100 mg/day 15,600 (H)   CT A/P 3/7: Bilateral renal parenchymal atrophy, unchanged from prior exam. There is prominence of the left greater than right renal collecting systems and proximal ureter without calcified stone. Urinary bladder is distended without wall thickening. No calcified urolithiasis.  Physical Exam:  Blood pressure 120/69, pulse 95, temperature 98.1 F (36.7 C), temperature source Oral, resp. rate 11, height 5\' 3"  (1.6 m), weight 55.8 kg (123 lb), SpO2 100 %. NAD, walking around room RRR  With 2/6 Systolic Murmur CTAB Mildly distended, +BS 1-2+ LEE b/l No rashes/lesions EOMI Nonfocal  A 1. AoCKD3, new finding of nephrotic syndrome 2. Flank / abd pain + bloating, edema in legs and genitals; CT A/P with "colitis";CSY  04/23/17 3. Sickle Cell Anemia 4. Chronic metaboilc acidosis on NaHCO3 related to #1 and #3  P 1. I suspect many of his symptoms related to progressive ascites/edema from his nephrotic syndrome; most likely this is FSGS 2. Hold further IVFs; Threasa Heads it will benefit his renal failure 3. Hold ACEi for now 4. Update / Check full serologies 5. Will need to consider renal biopsy, but high risk with anemia 6. Daily weights, Daily Renal Panel, Strict I/Os, Avoid nephrotoxins (NSAIDs, judicious IV Contrast)  7. Will cont to follow closely    Pearson Grippe MD 04/23/2017, 12:46 PM  Recent Labs  Lab 04/21/17 1019 04/21/17 1917 04/22/17 0801  NA 138 137 136  K 4.2 4.4 4.6  CL 111 110 110  CO2 22 20* 18*  GLUCOSE 104* 103* 94  BUN 16 17 18   CREATININE 3.10* 3.33* 3.43*  CALCIUM 7.6* 7.4* 7.5*  PHOS  --   --  4.1   Recent Labs  Lab 04/19/17 2035 04/20/17 0413 04/21/17 1019  WBC 18.0* 17.5* 16.2*  NEUTROABS 8.3* 8.4* 8.2*  HGB 5.5* 7.2* 7.7*  HCT 15.6* 21.5* 21.8*  MCV 102.0* 97.7 97.3  PLT 140* 142* 127*

## 2017-04-23 NOTE — Anesthesia Preprocedure Evaluation (Signed)
Anesthesia Evaluation  Patient identified by MRN, date of birth, ID band Patient awake    Reviewed: Allergy & Precautions, NPO status , Patient's Chart, lab work & pertinent test results  History of Anesthesia Complications Negative for: history of anesthetic complications  Airway Mallampati: II  TM Distance: >3 FB Neck ROM: Full    Dental  (+) Dental Advisory Given   Pulmonary Current Smoker,    breath sounds clear to auscultation       Cardiovascular negative cardio ROS   Rhythm:Regular Rate:Normal     Neuro/Psych  Headaches,    GI/Hepatic GERD  Controlled,  Endo/Other  negative endocrine ROS  Renal/GU Renal InsufficiencyRenal disease     Musculoskeletal   Abdominal   Peds  Hematology  (+) Blood dyscrasia (Hb 7.7, plt 127k), Sickle cell anemia , S/p splenectomy   Anesthesia Other Findings   Reproductive/Obstetrics                             Anesthesia Physical Anesthesia Plan  ASA: III  Anesthesia Plan: MAC   Post-op Pain Management:    Induction:   PONV Risk Score and Plan: 0 and Ondansetron  Airway Management Planned: Natural Airway and Nasal Cannula  Additional Equipment:   Intra-op Plan:   Post-operative Plan:   Informed Consent: I have reviewed the patients History and Physical, chart, labs and discussed the procedure including the risks, benefits and alternatives for the proposed anesthesia with the patient or authorized representative who has indicated his/her understanding and acceptance.   Dental advisory given  Plan Discussed with: CRNA and Surgeon  Anesthesia Plan Comments: (Plan routine monitors, MAC)        Anesthesia Quick Evaluation

## 2017-04-23 NOTE — Transfer of Care (Signed)
Immediate Anesthesia Transfer of Care Note  Patient: [name] A Ellingsen Jr.  Procedure(s) Performed: COLONOSCOPY (N/A )  Patient Location: PACU  Anesthesia Type:MAC  Level of Consciousness: sedated  Airway & Oxygen Therapy: Patient Spontanous Breathing and Patient connected to nasal cannula oxygen  Post-op Assessment: Report given to RN and Post -op Vital signs reviewed and stable  Post vital signs: Reviewed and stable  Last Vitals:  Vitals:   04/23/17 0810 04/23/17 0858  BP:  117/75  Pulse:  (!) 102  Resp: 13 12  Temp:  36.9 C  SpO2: 98% 97%    Last Pain:  Vitals:   04/23/17 0858  TempSrc: Oral  PainSc: 7       Patients Stated Pain Goal: 1 (18/84/16 6063)  Complications: No apparent anesthesia complications

## 2017-04-23 NOTE — Op Note (Signed)
[facility] Patient Name: [name] [name] Procedure Date: 04/23/2017 MRN: [id number] Attending MD: Docia Chuck. Henrene Pastor , MD Date of Birth: [birth year] CSN: [id number] Age: 37 Admit Type: Inpatient Procedure:                Colonoscopy, with biopsies Indications:              Abnormal CT of the GI trac. The question of colitis                            raised Providers:                Docia Chuck. Henrene Pastor, MD, Cleda Daub, RN, Elspeth Cho Tech., Technician, Charolette Child,                            Technician, Bell Alday CRNA, CRNA Referring MD:             Dr. Zigmund Daniel Medicines:                Monitored Anesthesia Care Complications:            No immediate complications. Estimated blood loss:                            None. Estimated Blood Loss:     Estimated blood loss: none. Procedure:                Pre-Anesthesia Assessment:                           - Prior to the procedure, a History and Physical                            was performed, and patient medications and                            allergies were reviewed. The patient's tolerance of                            previous anesthesia was also reviewed. The risks                            and benefits of the procedure and the sedation                            options and risks were discussed with the patient.                            All questions were answered, and informed consent                            was obtained. Prior Anticoagulants: The patient has  taken no previous anticoagulant or antiplatelet                            agents. ASA Grade Assessment: III - A patient with                            severe systemic disease. After reviewing the risks                            and benefits, the patient was deemed in                            satisfactory condition to undergo the procedure.                           After obtaining  informed consent, the colonoscope                            was passed under direct vision. Throughout the                            procedure, the patient's blood pressure, pulse, and                            oxygen saturations were monitored continuously. The                            EC-[id number] ([id number]) scope was introduced through                            the anus and advanced to the the cecum, identified                            by appendiceal orifice and ileocecal valve. The                            ileocecal valve, appendiceal orifice, and rectum                            were photographed. The quality of the bowel                            preparation was excellent. The colonoscopy was                            performed without difficulty. The patient tolerated                            the procedure well. The bowel preparation used was                            MoviPrep. Scope In: 10:17:58 AM Scope Out: 10:29:39 AM Scope Withdrawal Time: 0 hours 9 minutes 40 seconds  Total Procedure Duration: 0 hours 11  minutes 41 seconds  Findings:      The entire examined colon appeared normal on direct and retroflexion       views. Biopsies for histology were taken with a cold forceps from the       entire colon for evaluation of microscopic colitis. Impression:               - The entire examined colon is normal on direct and                            retroflexion views. No evidence for colitis or                            other colonic abnormality. Moderate Sedation:      none Recommendation:           1. Resume previous diet                           2. Schedule Doppler ultrasound of the liver "right                            upper quadrant pain, ascites, elevated liver tests,                            rule out hepatic vein or portal vein thrombosis"                           3. Resume care with primary service                           4. Await pathology  results. Procedure Code(s):        --- Professional ---                           [phone], Colonoscopy, flexible; with biopsy, single                            or multiple Diagnosis Code(s):        --- Professional ---                           R93.3, Abnormal findings on diagnostic imaging of                            other parts of digestive tract CPT copyright 2016 [employer]. All rights reserved. The codes documented in this report are preliminary and upon coder review may  be revised to meet current compliance requirements. Docia Chuck. Henrene Pastor, MD 04/23/2017 10:37:04 AM This report has been signed electronically. Number of Addenda: 0

## 2017-04-24 ENCOUNTER — Encounter (HOSPITAL_COMMUNITY): Payer: Self-pay | Admitting: Internal Medicine

## 2017-04-24 ENCOUNTER — Inpatient Hospital Stay (HOSPITAL_COMMUNITY): Payer: Medicare Other

## 2017-04-24 DIAGNOSIS — R945 Abnormal results of liver function studies: Secondary | ICD-10-CM

## 2017-04-24 LAB — RETICULOCYTES
RBC.: 1.78 MIL/uL — AB (ref 4.22–5.81)
RETIC COUNT ABSOLUTE: 249.2 10*3/uL — AB (ref 19.0–186.0)
Retic Ct Pct: 14 % — ABNORMAL HIGH (ref 0.4–3.1)

## 2017-04-24 LAB — CBC WITH DIFFERENTIAL/PLATELET
Basophils Absolute: 0 10*3/uL (ref 0.0–0.1)
Basophils Relative: 0 %
EOS PCT: 3 %
Eosinophils Absolute: 0.5 10*3/uL (ref 0.0–0.7)
HEMATOCRIT: 16.9 % — AB (ref 39.0–52.0)
HEMOGLOBIN: 6 g/dL — AB (ref 13.0–17.0)
LYMPHS ABS: 6.6 10*3/uL — AB (ref 0.7–4.0)
Lymphocytes Relative: 37 %
MCH: 33.7 pg (ref 26.0–34.0)
MCHC: 35.5 g/dL (ref 30.0–36.0)
MCV: 94.9 fL (ref 78.0–100.0)
Monocytes Absolute: 2.7 10*3/uL — ABNORMAL HIGH (ref 0.1–1.0)
Monocytes Relative: 15 %
NEUTROS ABS: 7.9 10*3/uL — AB (ref 1.7–7.7)
NRBC: 3 /100{WBCs} — AB
Neutrophils Relative %: 45 %
Platelets: 107 10*3/uL — ABNORMAL LOW (ref 150–400)
RBC: 1.78 MIL/uL — ABNORMAL LOW (ref 4.22–5.81)
RDW: 22.5 % — ABNORMAL HIGH (ref 11.5–15.5)
WBC: 17.7 10*3/uL — ABNORMAL HIGH (ref 4.0–10.5)

## 2017-04-24 LAB — PROTEIN ELECTROPHORESIS, SERUM
A/G Ratio: 0.7 (ref 0.7–1.7)
ALPHA-1-GLOBULIN: 0.2 g/dL (ref 0.0–0.4)
Albumin ELP: 1.5 g/dL — ABNORMAL LOW (ref 2.9–4.4)
Alpha-2-Globulin: 0.7 g/dL (ref 0.4–1.0)
BETA GLOBULIN: 0.7 g/dL (ref 0.7–1.3)
GAMMA GLOBULIN: 0.7 g/dL (ref 0.4–1.8)
Globulin, Total: 2.3 g/dL (ref 2.2–3.9)
Total Protein ELP: 3.8 g/dL — ABNORMAL LOW (ref 6.0–8.5)

## 2017-04-24 LAB — RENAL FUNCTION PANEL
ALBUMIN: 1.1 g/dL — AB (ref 3.5–5.0)
ANION GAP: 10 (ref 5–15)
BUN: 34 mg/dL — ABNORMAL HIGH (ref 6–20)
CALCIUM: 7.3 mg/dL — AB (ref 8.9–10.3)
CO2: 18 mmol/L — ABNORMAL LOW (ref 22–32)
Chloride: 112 mmol/L — ABNORMAL HIGH (ref 101–111)
Creatinine, Ser: 5.83 mg/dL — ABNORMAL HIGH (ref 0.61–1.24)
GFR calc non Af Amer: 11 mL/min — ABNORMAL LOW (ref >60)
GFR, EST AFRICAN AMERICAN: 13 mL/min — AB (ref >60)
Glucose, Bld: 98 mg/dL (ref 65–99)
PHOSPHORUS: 6.4 mg/dL — AB (ref 2.5–4.6)
Potassium: 4.4 mmol/L (ref 3.5–5.1)
Sodium: 140 mmol/L (ref 135–145)

## 2017-04-24 LAB — HEPATIC FUNCTION PANEL
ALBUMIN: 1.1 g/dL — AB (ref 3.5–5.0)
ALK PHOS: 165 U/L — AB (ref 38–126)
ALT: 29 U/L (ref 17–63)
AST: 81 U/L — ABNORMAL HIGH (ref 15–41)
BILIRUBIN TOTAL: 1.4 mg/dL — AB (ref 0.3–1.2)
Bilirubin, Direct: 0.5 mg/dL (ref 0.1–0.5)
Indirect Bilirubin: 0.9 mg/dL (ref 0.3–0.9)
Total Protein: 3.8 g/dL — ABNORMAL LOW (ref 6.5–8.1)

## 2017-04-24 LAB — APTT: aPTT: 36 seconds (ref 24–36)

## 2017-04-24 LAB — CK: Total CK: 35 U/L — ABNORMAL LOW (ref 49–397)

## 2017-04-24 LAB — ANTINUCLEAR ANTIBODIES, IFA: ANA Ab, IFA: NEGATIVE

## 2017-04-24 LAB — PREPARE RBC (CROSSMATCH)

## 2017-04-24 LAB — PROTIME-INR
INR: 1.2
PROTHROMBIN TIME: 15.1 s (ref 11.4–15.2)

## 2017-04-24 LAB — KAPPA/LAMBDA LIGHT CHAINS
Kappa free light chain: 127.8 mg/L — ABNORMAL HIGH (ref 3.3–19.4)
Kappa, lambda light chain ratio: 1.25 (ref 0.26–1.65)
Lambda free light chains: 102.4 mg/L — ABNORMAL HIGH (ref 5.7–26.3)

## 2017-04-24 LAB — HIV ANTIBODY (ROUTINE TESTING W REFLEX): HIV SCREEN 4TH GENERATION: NONREACTIVE

## 2017-04-24 LAB — HEPATITIS B SURFACE ANTIGEN: Hepatitis B Surface Ag: NEGATIVE

## 2017-04-24 LAB — HEPATITIS C ANTIBODY: HCV Ab: <0.1 s/co ratio (ref 0.0–0.9)

## 2017-04-24 LAB — RPR: RPR: NONREACTIVE

## 2017-04-24 LAB — FERRITIN: FERRITIN: 2419 ng/mL — AB (ref 24–336)

## 2017-04-24 LAB — ANTI-DNA ANTIBODY, DOUBLE-STRANDED

## 2017-04-24 MED ORDER — SODIUM CHLORIDE 0.9 % IV SOLN
25.0000 mg | Freq: Once | INTRAVENOUS | Status: AC
  Administered 2017-04-24: 25 mg via INTRAVENOUS
  Filled 2017-04-24: qty 25

## 2017-04-24 MED ORDER — SODIUM CHLORIDE 0.9 % IV SOLN
Freq: Once | INTRAVENOUS | Status: AC
  Administered 2017-04-24: 22:00:00 via INTRAVENOUS

## 2017-04-24 MED ORDER — SODIUM CHLORIDE 0.9 % IV SOLN: Freq: Once | INTRAVENOUS | Status: DC

## 2017-04-24 MED ORDER — ACETAMINOPHEN 325 MG PO TABS
650.0000 mg | ORAL_TABLET | Freq: Once | ORAL | Status: AC
  Administered 2017-04-24: 650 mg via ORAL
  Filled 2017-04-24: qty 2

## 2017-04-24 NOTE — Care Management Important Message (Signed)
Important Message  Patient Details  Name: [name] [name]. MRN: [id number] Date of Birth: Apr 30, 1980   Medicare Important Message Given:  Yes    Kerin Salen 04/24/2017, 11:23 AMImportant Message  Patient Details  Name: [name] [name]. MRN: [id number] Date of Birth: 1980-04-05   Medicare Important Message Given:  Yes    Kerin Salen 04/24/2017, 11:23 AM

## 2017-04-24 NOTE — Progress Notes (Signed)
Admit: 04/19/2017 LOS: 4  39M Sickle Cell Anemia and CKD with AoCKD and Nephrotic Syndrome  Subjective:  HBV, HCV, HIV, RPR, ANA, dsDNA, sFLC negative SCr up to 5.83, BUN 34, K 4.4 and HCO3 18 Discussed need for renal bipys, risks of bleeding, infection; rationale for diagnosis and to guide therapies, pt agrees No N/V, anorexia Albumin remains low Less bloating/discomfort today  03/11 0701 - 03/12 0700 In: 800 [I.V.:800] Out: 875 [Urine:875]  Filed Weights   04/22/17 0421 04/23/17 0858 04/24/17 0147  Weight: 56 kg (123 lb 7.3 oz) 55.8 kg (123 lb) 58.2 kg (128 lb 4.9 oz)    Scheduled Meds: . bisacodyl  5 mg Oral Once  . folic acid  1 mg Oral Daily  . HYDROmorphone   Intravenous Q4H  . senna-docusate  1 tablet Oral BID  . sodium bicarbonate  650 mg Oral TID   Continuous Infusions: . sodium chloride     PRN Meds:.naloxone **AND** sodium chloride flush  Current Labs: reviewed  Results for [name], [name] (MRN [id number]) as of 04/23/2017 12:46  Ref. Range 04/22/2017 18:47  Protein Creatinine Ratio Latest Ref Range: 0.00 - 0.15 mg/mgCre 30.41 (H)     Ref. Range 04/21/2017 15:17  Protein, 24H Urine Latest Ref Range: 50 - 100 mg/day 15,600 (H)   CT A/P 3/7: Bilateral renal parenchymal atrophy, unchanged from prior exam. There is prominence of the left greater than right renal collecting systems and proximal ureter without calcified stone. Urinary bladder is distended without wall thickening. No calcified urolithiasis.  Results for [name], [name] (MRN [id number]) as of 04/24/2017 14:26  Ref. Range 04/23/2017 14:45  Kappa, lamda light chain ratio Latest Ref Range: 0.26 - 1.65  1.25   Results for [name], [name] (MRN [id number]) as of 04/24/2017 14:26  Ref. Range 04/23/2017 14:45  Kappa, lamda light chain ratio Latest Ref Range: 0.26 - 1.65  1.25   Results for [name], [name] (MRN [id number]) as of 04/24/2017 14:26  Ref. Range 04/23/2017 14:45  ANA Ab,  IFA Unknown Negative  ds DNA Ab Latest Ref Range: 0 - 9 IU/mL <1   Results for [name], [name] (MRN [id number]) as of 04/24/2017 14:26  Ref. Range 04/23/2017 14:45  RPR Latest Ref Range: Non Reactive  Non Reactive  Hepatitis B Surface Ag Latest Ref Range: Negative  Negative  HCV Ab Latest Ref Range: 0.0 - 0.9 s/co ratio <0.1  HIV Screen 4th Generation wRfx Latest Ref Range: Non Reactive  Non Reactive   Physical Exam:  Blood pressure 110/75, pulse 98, temperature 98.1 F (36.7 C), temperature source Oral, resp. rate 14, height 5\' 3"  (1.6 m), weight 58.2 kg (128 lb 4.9 oz), SpO2 100 %. NAD, walking around room RRR  With 2/6 Systolic Murmur CTAB Mildly distended, +BS 1-2+ LEE b/l No rashes/lesions EOMI Nonfocal  A 1. AoCKD3, new finding of nephrotic syndrome; not uremic; negative serologies, unclear etiology 2. Profound hypoalbuminemia likely related #1 3. Flank / abd pain + bloating, edema in legs and genitals; CT A/P with "colitis";CSY 04/23/17 4. Sickle Cell Anemia 5. Chronic metaboilc acidosis on NaHCO3 related to #1 and #3  P 1. I suspect many of his symptoms related to progressive ascites/edema from his nephrotic syndrome; most likely this is FSGS but this is uncertain 2. Needs clarification with renal biopys, pt agreed, IR consulted; nephropathology paperwork in chart 3. Hold ACEi for now 4. Serologies negative, complements pending 5. Daily weights, Daily Renal Panel,  Strict I/Os, Avoid nephrotoxins (NSAIDs, judicious IV Contrast)  6. No uremia 7. Will cont to follow closely    [name] Grippe MD 04/24/2017, 2:26 PM  Recent Labs  Lab 04/22/17 0801 04/23/17 1445 04/24/17 1200  NA 136 134* 140  K 4.6 4.7 4.4  CL 110 108 112*  CO2 18* 18* 18*  GLUCOSE 94 99 98  BUN 18 25* 34*  CREATININE 3.43* 4.52* 5.83*  CALCIUM 7.5* 6.9* 7.3*  PHOS 4.1  --  6.4*   Recent Labs  Lab 04/19/17 2035 04/20/17 0413 04/21/17 1019  WBC 18.0* 17.5* 16.2*  NEUTROABS 8.3* 8.4*  8.2*  HGB 5.5* 7.2* 7.7*  HCT 15.6* 21.5* 21.8*  MCV 102.0* 97.7 97.3  PLT 140* 142* 127*

## 2017-04-24 NOTE — Progress Notes (Addendum)
Patient ID: [name] [name]., male   DOB: 07-26-1980, 37 y.o.   MRN: [id number]    Progress Note   Subjective   Sleeping - awakened easily - says abdominal pain is improving " getting there ". Pain still RUQ /Right flank area  Waiting for IJ to be placed  Today   [place] yesterday - trace ascites - not enough for paracentesis UD doppler was not done    Objective   Vital signs in last 24 hours: Temp:  [98 F (36.7 C)-98.6 F (37 C)] 98.1 F (36.7 C) (03/12 0950) Pulse Rate:  [90-104] 98 (03/12 0950) Resp:  [11-17] 13 (03/12 0950) BP: (103-122)/(66-75) 110/75 (03/12 0950) SpO2:  [97 %-100 %] 100 % (03/12 0950) Weight:  [128 lb 4.9 oz (58.2 kg)] 128 lb 4.9 oz (58.2 kg) (03/12 0147) Last BM Date: 04/20/17 General:    AA male  in NAD Heart:  Regular rate and rhythm; no murmurs Lungs: Respirations even and unlabored, lungs CTA bilaterally Abdomen:  Soft, nontender to  my exam   and nondistended. Normal bowel sounds. Extremities:  Without edema. Neurologic:  Alert and oriented,  grossly normal neurologically. Psych:  Cooperative. Normal mood and affect.  Intake/Output from previous day: 03/11 0701 - 03/12 0700 In: 800 [I.V.:800] Out: 875 [Urine:875] Intake/Output this shift: Total I/O In: -  Out: 300 [Urine:300]  Lab Results: Recent Labs    04/21/17 1019  WBC 16.2*  HGB 7.7*  HCT 21.8*  PLT 127*   BMET Recent Labs    04/21/17 1917 04/22/17 0801 04/23/17 1445  NA 137 136 134*  K 4.4 4.6 4.7  CL 110 110 108  CO2 20* 18* 18*  GLUCOSE 103* 94 99  BUN 17 18 25*  CREATININE 3.33* 3.43* 4.52*  CALCIUM 7.4* 7.5* 6.9*   LFT Recent Labs    04/21/17 1019  PROT 4.3*  ALBUMIN 1.3*  AST 73*  ALT 29  ALKPHOS 184*  BILITOT 2.5*  BILIDIR 0.7*  IBILI 1.8*   PT/INR No results for input(s): LABPROT, INR in the last 72 hours.  Studies/Results: US Abdomen Limited  Result Date: 04/23/2017 CLINICAL DATA:  Ascites seen on CT abdomen and pelvis 04/19/2017.  Evaluate for paracentesis. EXAM: LIMITED ABDOMEN ULTRASOUND FOR ASCITES TECHNIQUE: Limited ultrasound survey for ascites was performed in all four abdominal quadrants. COMPARISON:  None. FINDINGS: Very little ascitic fluid is identified. The patient is not a candidate for paracentesis. IMPRESSION: As above. Electronically Signed   By: Inge Rise M.D.   On: 04/23/2017 16:05       Assessment / Plan:    #1 Complicated 37 yo AA male with Sickle cell admitted 3/7 with right sided pain and loose stool Concern for Colitis by Ct on admit- ? Ischemic- Colonoscopy yesterday was normal , random  Bx show no active inflammation  Per Dr Zigmund Daniel notes -diarrhea felt likely secondary to opiod withdrawal - resolved  #2 ascites by CT on admit - raised question of veno occlusive process ? Hepatic or portal vein thrombosis - not seen on CT but no IV contrast   he is to have [place] with dopplers today - ordered yesterday but not done  Also question sickle cell hepatopathy/crisis  I do not think he has had SBP -? D/C zosyn   #3 progressive renal failure - Nephrology to see   Contact  Amy Dougherty, P.A.-C               [phone]  Principal Problem:  Sickle cell pain crisis ([facility]) Active Problems:   CHRONIC KIDNEY DISEASE STAGE II (MILD)   Acute renal failure superimposed on chronic kidney disease (HCC)   Colitis   Nausea vomiting and diarrhea   Other ascites   RUQ pain   Abnormal CT of the abdomen   Immune-complex glomerulonephritis    LOS: 4 days   Amy Esterwood  04/24/2017, 10:21 AM  GI ATTENDING  Interval history data reviewed. Agree with interval progress note. COLONIC BIOPSIES NORMAL. Await Doppler ultrasound of the liver.   Docia Chuck. Geri Semin[place]., M.D. [facility] Division of Gastroenterology

## 2017-04-24 NOTE — Progress Notes (Signed)
[name] [name] was advise around 0820 this am to remain NPO. Not to drink or eat because and ultra sound of his abdomen was schedule. At 12:00 noon he was eating jello. He stated" I don"t care if I can"t have the ultra sound today.The machines must be stupid. I'm hungry and I want to eat ". When I told him  the ultra sound could not be done today,  He stated "good ". I sent a test to Dr Zigmund Daniel and I spoke to her. Ultra sound was also notifed

## 2017-04-24 NOTE — Progress Notes (Addendum)
Referring Physician(s): Sanford,R  Supervising Physician: Aletta Edouard  Patient Status:  [facility] - In-pt  Chief Complaint:  Nephrotic syndrome, acute on chronic kidney disease  Subjective: Patient familiar to IR service from prior tunneled right jugular PICC placement in 2018.  He has a history of sickle cell anemia and now with acute on chronic kidney disease, nephrotic syndrome with proteinuria, hypoalbuminemia, flank and abdominal discomfort.  Request now received from nephrology for random renal biopsy to rule out FSGS versus other etiology.  Patient currently denies fever, headache, chest pain, worsening dyspnea, cough, nausea ,vomiting or bleeding.  Past Medical History:  Diagnosis Date  . Arachnoid Cyst of brain bilaterally    "2 really small ones in the back of my head; inside; saw them w/MRI" (09/25/2012)  . Bacterial pneumonia ~ 2012   "caught it here in the hospital" (09/25/2012)  . Chronic kidney disease    "from my sickle cell" (09/25/2012)  . CKD (chronic kidney disease), stage II   . GERD (gastroesophageal reflux disease)    "after I eat alot of spicey foods" (09/25/2012)  . Gynecomastia, male 07/10/2012  . History of blood transfusion    "always related to sickle cell crisis" (09/25/2012)  . 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   Noted in noted from Hematology notes at [facility]  . Migraines    "take RX qd to prevent them" (09/25/2012)  . Sickle cell anemia (HCC)   . Sickle cell crisis ([place]) 09/25/2012  . Sickle cell nephropathy ([place]) 07/10/2012  . Sinus tachycardia   . Tachycardia with heart rate 121-140 beats per minute with ambulation 08/04/2016   Past Surgical History:  Procedure Laterality Date  . CHOLECYSTECTOMY  ~ 2012  . COLONOSCOPY N/A 04/23/2017   Procedure: COLONOSCOPY;  Surgeon: Irene Shipper, MD;  Location: [facility];  Service: Endoscopy;  Laterality: N/A;  . IR FLUORO GUIDE CV LINE RIGHT  12/17/2016  . IR REMOVAL TUN CV CATH W/O FL   12/21/2016  . IR US GUIDE VASC ACCESS RIGHT  12/17/2016  . spleenectomy      Allergies: Nsaids and Morphine and related  Medications: Prior to Admission medications   Medication Sig Start Date End Date Taking? Authorizing Provider  enalapril (VASOTEC) 5 MG tablet Take 5 mg by mouth daily. 12/09/16  Yes [provider]  folic acid (FOLVITE) 1 MG tablet Take 1 tablet (1 mg total) daily by mouth. 12/22/16  Yes Leana Gamer, MD  nortriptyline (PAMELOR) 25 MG capsule TAKE 1 CAPSULE (25 MG TOTAL) BY MOUTH AT BEDTIME. 01/18/17  Yes Donnamae Jude, MD  Oxycodone HCl 10 MG TABS Take 1 tablet (10 mg total) by mouth every 6 (six) hours as needed. 03/04/17  Yes [name], MD  sodium bicarbonate 650 MG tablet Take 1 tablet (650 mg total) 3 (three) times daily by mouth. 12/21/16  Yes Leana Gamer, MD  Menthol-Methyl Salicylate (MUSCLE RUB) 10-15 % CREA Apply 1 application as needed topically for muscle pain. Patient not taking: Reported on 04/19/2017 12/21/16   Leana Gamer, MD     Vital Signs: BP 110/75 (BP Location: Right Arm)   Pulse 98   Temp 98.1 F (36.7 C) (Oral)   Resp 14   Ht 5\' 3"  (1.6 m)   Wt 128 lb 4.9 oz (58.2 kg)   SpO2 100%   BMI 22.73 kg/m   Physical Exam awake, alert.  Chest clear to auscultation bilaterally.  Heart with regular rate and rhythm, positive  murmur.  Abdomen soft, minimal distention, positive bowel sounds, generalized tenderness to palpation.  1+ lower extremity edema.  Right IJ central venous line in place  Imaging: US Abdomen Limited  Result Date: 04/23/2017 CLINICAL DATA:  Ascites seen on CT abdomen and pelvis 04/19/2017. Evaluate for paracentesis. EXAM: LIMITED ABDOMEN ULTRASOUND FOR ASCITES TECHNIQUE: Limited ultrasound survey for ascites was performed in all four abdominal quadrants. COMPARISON:  None. FINDINGS: Very little ascitic fluid is identified. The patient is not a candidate for paracentesis. IMPRESSION: As above.  Electronically Signed   By: Inge Rise M.D.   On: 04/23/2017 16:05   Dg Chest Port 1 View  Result Date: 04/24/2017 CLINICAL DATA:  Central catheter placement EXAM: PORTABLE CHEST 1 VIEW COMPARISON:  April 07, 2017 FINDINGS: Central catheter tip is in the superior vena cava near the cavoatrial junction. No pneumothorax. There is patchy bibasilar lung atelectatic change. Lungs elsewhere are clear. Heart size and pulmonary vascularity are normal. No adenopathy. There is avascular necrosis in the right humeral head. IMPRESSION: 1. Central catheter tip in superior vena cava near cavoatrial junction. No pneumothorax. 2. Bibasilar atelectasis, slightly more on the left than the right. Earliest changes of pneumonia cannot be excluded. 3.  Avascular necrosis right humeral head. Electronically Signed   By: Lowella Grip III M.D.   On: 04/24/2017 11:54    Labs:  CBC: Recent Labs    04/19/17 2035 04/20/17 0413 04/21/17 1019 04/24/17 1200  WBC 18.0* 17.5* 16.2* PENDING  HGB 5.5* 7.2* 7.7* 6.0*  HCT 15.6* 21.5* 21.8* 16.9*  PLT 140* 142* 127* PENDING    COAGS: Recent Labs    04/24/17 1200  INR 1.20  APTT 36    BMP: Recent Labs    04/21/17 1917 04/22/17 0801 04/23/17 1445 04/24/17 1200  NA 137 136 134* 140  K 4.4 4.6 4.7 4.4  CL 110 110 108 112*  CO2 20* 18* 18* 18*  GLUCOSE 103* 94 99 98  BUN 17 18 25* 34*  CALCIUM 7.4* 7.5* 6.9* 7.3*  CREATININE 3.33* 3.43* 4.52* 5.83*  GFRNONAA 23* 22* 16* 11*  GFRAA 26* 25* 18* 13*    LIVER FUNCTION TESTS: Recent Labs    04/08/17 0635 04/09/17 0639 04/19/17 2035 04/21/17 1019 04/24/17 1200  BILITOT 2.1* 2.1* 2.2* 2.5*  --   AST 94* 73* 80* 73*  --   ALT 38 35 35 29  --   ALKPHOS 188* 184* 197* 184*  --   PROT 4.1* 4.5* 4.4* 4.3*  --   ALBUMIN 1.1* 1.3* 1.3* 1.3* 1.1*    Assessment and Plan: Pt with history of sickle cell anemia and now with acute on chronic kidney disease, nephrotic syndrome with proteinuria,  hypoalbuminemia, flank and abdominal discomfort.  Request now received from nephrology for random renal biopsy to rule out FSGS versus other etiology.  Recent imaging shows bilateral renal parenchymal atrophy with prominence of the left greater than right renal collecting system and proximal ureter without calcified stone.Risks and benefits discussed with the patient including, but not limited to bleeding, infection, damage to adjacent structures or low yield requiring additional tests.  All of the patient's questions were answered, patient is agreeable to proceed. Consent signed and in chart.  Procedure TENT scheduled for 3/13; hgb today 6.0- awaiting transfusion per nurse report   Electronically Signed: D. Rowe Robert, PA-C 04/24/2017, 3:18 PM   I spent a total of 25 minutes at the the patient's bedside AND on the patient's hospital floor or  unit, greater than 50% of which was counseling/coordinating care for ultrasound-guided random core renal biopsy    Patient ID: [name] Meyer., male   DOB: 09-28-1980, 37 y.o.   MRN: [id number]

## 2017-04-24 NOTE — Progress Notes (Signed)
SICKLE CELL SERVICE PROGRESS NOTE  [name] [name]. QIH:[id number] DOB: May 05, 1980 DOA: 04/19/2017 PCP: [name], MD  Assessment/Plan: Principal Problem:   Sickle cell pain crisis ([place]) Active Problems:   CHRONIC KIDNEY DISEASE STAGE II (MILD)   Acute renal failure superimposed on chronic kidney disease (HCC)   Colitis   Nausea vomiting and diarrhea   Other ascites   RUQ pain   Abnormal CT of the abdomen   Immune-complex glomerulonephritis  1. Worsening Renal Function in setting of CKD: Renal function continues to worsen and urine output is slowing down. I have spoken with the Nephrologist and we will proceed with the renal biopsy tomorrow. All serology negative so far.  2. Scrotal Swelling: Secondary th renal failure and nephrotic syndrome.  3. Moderate Ascites: Will continue Zosyn for presumed SBP. Obtain diagnostic paracentesis by IR tomorrow. This may also be associated with worsening renal function.   4. Diarrhea: Resolved. Likely secondary to Opiate withdrawal. Colonoscopy completed yesterday and per Dr. Henrene Pastor, pathology negative from biopsy. 5. Hb SS with Crisis:Discontinue PCA today. Continue Oxycodone PRN.Pt unable to receive NSAID's due to AKI.  6. Thrombocytopenia: Etiology unclear. Likely multifactorial. Likely related to renal failure. 7. Pruritis: Likely related to renal disease. No evidence of liver synthetic dysfunction.  8. Elevated Alkaline Phosphatase: Etiology unclear. U/S ordered and was scheduled for today but patient ate despite being asked not to and test had to be postponed. U/S to be done tomorrow before renal biopsy.  9. Leukocytosis: This is a feature of sickle cell crisis. No evidence of infection. However a review of his records show that he has chronically elevated WBC.  10. Chronic Pain Syndrome: Pt reports that he  takes about 20 mg of Oxycodone daily.  11. Chronic Metabolic Acidosis: Pt takes Sodium Bicarbonate on a chronic basis. Serum  bicarbonate normal. Continue. 12. Communication: Spoke at length (55 minutes) with mother who is very concerned that he will require dialysis   Code Status: Full Code Family Communication: N/A Disposition Plan: Not yet ready for discharge  [name].  Pager [phone]. If 7PM-7AM, please contact night-coverage.  04/24/2017, 11:02 AM  LOS: 4 days   Interim History: Pt in a much better disposition than he has been in the previous days. He admits that he is scared and does not understand all that's occurring. I suspect that this is the reason for his bravado and defensive attitude. He was very defiant with the nurses this morning but is more cooperative this morning.   Consultants:  Gastroenterology- Dr. Hilarie Fredrickson  Nephrology- Dr. Jonnie Finner, Dr. Joelyn Oms  IR  Procedures:  None  Antibiotics:  Ciprofloxacin 3/7  Zosyn 3/8 >>3/11   Objective: Vitals:   04/24/17 0400 04/24/17 0501 04/24/17 0831 04/24/17 0950  BP:  108/73  110/75  Pulse:  97  98  Resp: 12 16 13 13   Temp:  98.4 F (36.9 C)  98.1 F (36.7 C)  TempSrc:  Oral  Oral  SpO2: 100% 98% 100% 100%  Weight:      Height:       Weight change:   Intake/Output Summary (Last 24 hours) at 04/24/2017 1102 Last data filed at 04/24/2017 0816 Gross per 24 hour  Intake -  Output 1075 ml  Net -1075 ml     Physical Exam General: Alert, awake, oriented x3, in no apparent distress. Ambulating without difficulty. HEENT: [place]/AT PEERL, EOMI, mild icterus. Neck: Trachea midline,  no masses, no thyromegal,y no JVD, no carotid bruit OROPHARYNX:  Moist,  No exudate/ erythema/lesions.  Heart: Regular rate and rhythm. Pt has a non-radiating  II/VI SEM at the 2nd intercostal space. PMI non-displaced, no heaves or thrills on palpation. Lungs: Clear to auscultation, no wheezing or rhonchi noted. No increased vocal fremitus resonant to percussion  Abdomen: Soft, non-tender. Abdomen is mildly distended, positive bowel sounds, no  masses noted.  Neuro: No focal neurological deficits noted cranial nerves II through XII grossly intact.  Strength at baseline in bilateral upper and lower extremities. Musculoskeletal: No warmth swelling or erythema around joints, no spinal tenderness noted. Psychiatric: Patient alert and oriented x 3, impaired recent recall.   Data Reviewed: Basic Metabolic Panel: Recent Labs  Lab 04/20/17 0413 04/21/17 1019 04/21/17 1917 04/22/17 0801 04/23/17 1445  NA 139 138 137 136 134*  K 3.9 4.2 4.4 4.6 4.7  CL 113* 111 110 110 108  CO2 20* 22 20* 18* 18*  GLUCOSE 94 104* 103* 94 99  BUN 16 16 17 18  25*  CREATININE 2.18* 3.10* 3.33* 3.43* 4.52*  CALCIUM 7.2* 7.6* 7.4* 7.5* 6.9*  PHOS  --   --   --  4.1  --    Liver Function Tests: Recent Labs  Lab 04/19/17 2035 04/21/17 1019  AST 80* 73*  ALT 35 29  ALKPHOS 197* 184*  BILITOT 2.2* 2.5*  PROT 4.4* 4.3*  ALBUMIN 1.3* 1.3*   No results for input(s): LIPASE, AMYLASE in the last 168 hours. No results for input(s): AMMONIA in the last 168 hours. CBC: Recent Labs  Lab 04/19/17 2035 04/20/17 0413 04/21/17 1019  WBC 18.0* 17.5* 16.2*  NEUTROABS 8.3* 8.4* 8.2*  HGB 5.5* 7.2* 7.7*  HCT 15.6* 21.5* 21.8*  MCV 102.0* 97.7 97.3  PLT 140* 142* 127*   Cardiac Enzymes: No results for input(s): CKTOTAL, CKMB, CKMBINDEX, TROPONINI in the last 168 hours. BNP (last 3 results) No results for input(s): BNP in the last 8760 hours.  ProBNP (last 3 results) No results for input(s): PROBNP in the last 8760 hours.  CBG: No results for input(s): GLUCAP in the last 168 hours.  No results found for this or any previous visit (from the past 240 hour(s)).   Studies: US Abdomen Limited  Result Date: 04/23/2017 CLINICAL DATA:  Ascites seen on CT abdomen and pelvis 04/19/2017. Evaluate for paracentesis. EXAM: LIMITED ABDOMEN ULTRASOUND FOR ASCITES TECHNIQUE: Limited ultrasound survey for ascites was performed in all four abdominal quadrants.  COMPARISON:  None. FINDINGS: Very little ascitic fluid is identified. The patient is not a candidate for paracentesis. IMPRESSION: As above. Electronically Signed   By: Inge Rise M.D.   On: 04/23/2017 16:05   Dg Chest Port 1 View  Result Date: 04/07/2017 CLINICAL DATA:  Sickle cell pain crisis.  Right flank pain. EXAM: PORTABLE CHEST 1 VIEW COMPARISON:  02/27/2017 FINDINGS: The heart is normal in size allowing for AP technique and low lung volumes. Lungs are under aerated with bibasilar atelectasis. No pneumothorax or pleural effusion. There are sclerotic changes and articular surface collapse involving the right humeral head consistent with avascular necrosis and bone infarct. IMPRESSION: Bibasilar atelectasis. Bony changes related to sickle cell disorder and bone infarct. Electronically Signed   By: Marybelle Killings M.D.   On: 04/07/2017 14:03   Ct Renal Stone Study  Result Date: 04/19/2017 CLINICAL DATA:  Flank pain. Stone disease suspected. Diarrhea and abdominal discomfort. Recent hospital admission for colitis. No improvement, now with weakness. EXAM: CT ABDOMEN AND PELVIS WITHOUT CONTRAST TECHNIQUE: Multidetector CT imaging of the  abdomen and pelvis was performed following the standard protocol without IV contrast. COMPARISON:  CT 04/07/2017 FINDINGS: Lower chest: Bibasilar scarring with slight improved lung aeration from prior exam. Trace bilateral pleural thickening. Decreased density of the blood pool consistent with anemia. Hepatobiliary: No focal hepatic lesion. Clips in the gallbladder fossa postcholecystectomy. No biliary dilatation. Pancreas: No ductal dilatation. No definite peripancreatic fat stranding, however mild generalized edema of the upper mesentery. Spleen: Tiny calcified spleen, sequela of sickle cell, unchanged. Adrenals/Urinary Tract: Normal adrenal glands. Bilateral renal parenchymal atrophy, unchanged from prior exam. There is prominence of the left greater than right renal  collecting systems and proximal ureter without calcified stone. Urinary bladder is distended without wall thickening. No calcified urolithiasis. Stomach/Bowel: Persistent and slight worsening colonic wall thickening of the ascending and transverse colon, now with involvement of the mid and proximal descending colon. No evidence of perforation. Possible wall thickening of the duodenum, suboptimally assessed without enteric contrast and presence of intra-abdominal ascites. No other small bowel inflammation. Normal appendix. Vascular/Lymphatic: Innumerable central mesenteric nodes that are not enlarged by size criteria. Decreased blood pool density consistent with anemia. Reproductive: Prostate is unremarkable. Other: Increased abdominopelvic ascites from prior, small to moderate. Increased mesenteric edema. No free air. No evidence of intra-abdominal abscess. Musculoskeletal: Diffuse osteosclerosis consistent with history of sickle cell. Multiple small endplate depressions throughout spine. No acute finding. IMPRESSION: 1. Persistent/worsening colitis with colonic wall thickening of the ascending and transverse colon, new involvement of the descending colon. Possible new wall thickening of the duodenum. Findings may be infectious or inflammatory. 2. Increased abdominopelvic ascites. 3. Prominence of bilateral renal collecting systems without urolithiasis. This may be secondary to urinary bladder distention. Electronically Signed   By: Jeb Levering M.D.   On: 04/19/2017 23:43   Ct Renal Stone Study  Result Date: 04/07/2017 CLINICAL DATA:  Right-sided flank pain beginning today. Sickle cell disease. EXAM: CT ABDOMEN AND PELVIS WITHOUT CONTRAST TECHNIQUE: Multidetector CT imaging of the abdomen and pelvis was performed following the standard protocol without IV contrast. COMPARISON:  None. FINDINGS: Lower chest: Bibasilar scarring. Asymmetric opacity in left lung base may be due to atelectasis or infiltrate.  Hepatobiliary: No mass visualized on this unenhanced exam. Prior cholecystectomy. No evidence of biliary obstruction. Pancreas: No mass or inflammatory process visualized on this unenhanced exam. Spleen: Tiny calcified spleen, consistent with auto splenectomy of sickle cell disease. Adrenals/Urinary tract: Bilateral renal parenchymal atrophy. No evidence of urolithiasis or hydronephrosis. Unremarkable unopacified urinary bladder. Stomach/Bowel: Mild to moderate wall thickening is seen involving the ascending and transverse colon, consistent with colitis. No evidence of bowel obstruction or abscess. Mild ascites noted. Vascular/Lymphatic: No pathologically enlarged lymph nodes identified. No evidence of abdominal aortic aneurysm. Reproductive:  No mass or other significant abnormality. Other:  None. Musculoskeletal: No suspicious bone lesions identified. Diffuse osteosclerosis, consistent with sickle cell disease. IMPRESSION: Colitis involving the ascending and transverse colon.  Mild ascites. No evidence of urolithiasis or hydronephrosis. Mild atelectasis or scarring in left lung base. Diffuse osteosclerosis and auto-splenectomy, consistent with chronic sickle cell disease. Electronically Signed   By: Earle Gell M.D.   On: 04/07/2017 15:56    Scheduled Meds: . bisacodyl  5 mg Oral Once  . folic acid  1 mg Oral Daily  . HYDROmorphone   Intravenous Q4H  . senna-docusate  1 tablet Oral BID  . sodium bicarbonate  650 mg Oral TID   Continuous Infusions: . sodium chloride      Principal Problem:  Sickle cell pain crisis ([place]) Active Problems:   CHRONIC KIDNEY DISEASE STAGE II (MILD)   Acute renal failure superimposed on chronic kidney disease (HCC)   Colitis   Nausea vomiting and diarrhea   Other ascites   RUQ pain   Abnormal CT of the abdomen   Immune-complex glomerulonephritis    In excess of 60 minutes spent during this visit. Greater than 50% involved face to face contact with the patient  for assessment, counseling and coordination of care.

## 2017-04-24 NOTE — Progress Notes (Signed)
CRITICAL VALUE ALERT  Critical Value: hgb 6.0  Date & Time Notied:  04/24/2017 1429   Provider Notified: Dr Zigmund Daniel  Orders Received/Actions taken:  She is placing orders

## 2017-04-24 NOTE — Procedures (Signed)
Central Venous Catheter Insertion Procedure Note [place] Hawker [id number] 05-22-1980  Procedure: Insertion of Central Venous Catheter Indications: Assessment of intravascular volume, Drug and/or fluid administration and Frequent blood sampling  Procedure Details Consent: Risks of procedure as well as the alternatives and risks of each were explained to the (patient/caregiver).  Consent for procedure obtained. Time Out: Verified patient identification, verified procedure, site/side was marked, verified correct patient position, special equipment/implants available, medications/allergies/relevent history reviewed, required imaging and test results available.  Performed   Maximum sterile technique was used including antiseptics, cap, gloves, gown, hand hygiene, mask and sheet. Skin prep: Chlorhexidine; local anesthetic administered A antimicrobial bonded/coated triple lumen catheter was placed in the right internal jugular vein using the Seldinger technique. Ultrasound guidance used.Yes.       Catheter placed to 17 cm. Blood aspirated via all 3 ports and then flushed x 3. Line sutured x 2 and dressing applied.  Evaluation Blood flow good Complications: No apparent complications Patient did tolerate procedure well. Chest X-ray ordered to verify placement.  CXR: pending.  Richardson Landry Minor ACNP Maryanna Shape PCCM Pager [phone] till 1 pm If no answer page [phone] 04/24/2017, 11:30 AM

## 2017-04-25 ENCOUNTER — Inpatient Hospital Stay (HOSPITAL_COMMUNITY): Payer: Medicare Other

## 2017-04-25 LAB — CBC WITH DIFFERENTIAL/PLATELET
BASOS PCT: 0 %
Basophils Absolute: 0 10*3/uL (ref 0.0–0.1)
EOS ABS: 0.3 10*3/uL (ref 0.0–0.7)
Eosinophils Relative: 2 %
HCT: 22.5 % — ABNORMAL LOW (ref 39.0–52.0)
HEMOGLOBIN: 7.8 g/dL — AB (ref 13.0–17.0)
LYMPHS ABS: 6.7 10*3/uL — AB (ref 0.7–4.0)
LYMPHS PCT: 39 %
MCH: 32.4 pg (ref 26.0–34.0)
MCHC: 34.7 g/dL (ref 30.0–36.0)
MCV: 93.4 fL (ref 78.0–100.0)
Monocytes Absolute: 1.9 10*3/uL — ABNORMAL HIGH (ref 0.1–1.0)
Monocytes Relative: 11 %
NEUTROS ABS: 8.2 10*3/uL — AB (ref 1.7–7.7)
Neutrophils Relative %: 48 %
Platelets: 128 10*3/uL — ABNORMAL LOW (ref 150–400)
RBC: 2.41 MIL/uL — ABNORMAL LOW (ref 4.22–5.81)
RDW: 20 % — ABNORMAL HIGH (ref 11.5–15.5)
WBC: 17.1 10*3/uL — ABNORMAL HIGH (ref 4.0–10.5)

## 2017-04-25 LAB — RENAL FUNCTION PANEL
ANION GAP: 10 (ref 5–15)
Albumin: 1.1 g/dL — ABNORMAL LOW (ref 3.5–5.0)
BUN: 37 mg/dL — ABNORMAL HIGH (ref 6–20)
CHLORIDE: 113 mmol/L — AB (ref 101–111)
CO2: 17 mmol/L — ABNORMAL LOW (ref 22–32)
Calcium: 7.1 mg/dL — ABNORMAL LOW (ref 8.9–10.3)
Creatinine, Ser: 6.35 mg/dL — ABNORMAL HIGH (ref 0.61–1.24)
GFR calc Af Amer: 12 mL/min — ABNORMAL LOW (ref >60)
GFR, EST NON AFRICAN AMERICAN: 10 mL/min — AB (ref >60)
Glucose, Bld: 95 mg/dL (ref 65–99)
POTASSIUM: 4.4 mmol/L (ref 3.5–5.1)
Phosphorus: 5.9 mg/dL — ABNORMAL HIGH (ref 2.5–4.6)
Sodium: 140 mmol/L (ref 135–145)

## 2017-04-25 LAB — CBC
HEMATOCRIT: 21.5 % — AB (ref 39.0–52.0)
Hemoglobin: 7.4 g/dL — ABNORMAL LOW (ref 13.0–17.0)
MCH: 31.8 pg (ref 26.0–34.0)
MCHC: 34.4 g/dL (ref 30.0–36.0)
MCV: 92.3 fL (ref 78.0–100.0)
PLATELETS: 118 10*3/uL — AB (ref 150–400)
RBC: 2.33 MIL/uL — ABNORMAL LOW (ref 4.22–5.81)
RDW: 20.5 % — AB (ref 11.5–15.5)
WBC: 16.8 10*3/uL — ABNORMAL HIGH (ref 4.0–10.5)

## 2017-04-25 LAB — RETICULOCYTES
RBC.: 2.45 MIL/uL — ABNORMAL LOW (ref 4.22–5.81)
RETIC CT PCT: 12.3 % — AB (ref 0.4–3.1)
Retic Count, Absolute: 301.4 10*3/uL — ABNORMAL HIGH (ref 19.0–186.0)

## 2017-04-25 LAB — C3 COMPLEMENT: C3 COMPLEMENT: 119 mg/dL (ref 82–167)

## 2017-04-25 LAB — LIPID PANEL
CHOLESTEROL: 190 mg/dL (ref 0–200)
HDL: 67 mg/dL (ref >40)
LDL Cholesterol: 88 mg/dL (ref 0–99)
Total CHOL/HDL Ratio: 2.8 RATIO
Triglycerides: 174 mg/dL — ABNORMAL HIGH (ref <150)
VLDL: 35 mg/dL (ref 0–40)

## 2017-04-25 LAB — C4 COMPLEMENT: Complement C4, Body Fluid: 32 mg/dL (ref 14–44)
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
## 2017-04-25 MED ORDER — HYDROCODONE-ACETAMINOPHEN 5-325 MG PO TABS
1.0000 | ORAL_TABLET | ORAL | Status: DC | PRN
  Administered 2017-04-25: 2 via ORAL
  Filled 2017-04-25: qty 2

## 2017-04-25 MED ORDER — HYDROMORPHONE HCL 1 MG/ML IJ SOLN
1.0000 mg | Freq: Once | INTRAMUSCULAR | Status: AC
  Administered 2017-04-25: 1 mg via INTRAVENOUS
  Filled 2017-04-25: qty 1

## 2017-04-25 MED ORDER — MIDAZOLAM HCL 2 MG/2ML IJ SOLN
INTRAMUSCULAR | Status: AC
  Filled 2017-04-25: qty 6

## 2017-04-25 MED ORDER — FENTANYL CITRATE (PF) 100 MCG/2ML IJ SOLN
INTRAMUSCULAR | Status: AC
  Filled 2017-04-25: qty 6

## 2017-04-25 MED ORDER — FENTANYL CITRATE (PF) 100 MCG/2ML IJ SOLN
INTRAMUSCULAR | Status: AC | PRN
  Administered 2017-04-25 (×3): 50 ug via INTRAVENOUS

## 2017-04-25 MED ORDER — MIDAZOLAM HCL 2 MG/2ML IJ SOLN
INTRAMUSCULAR | Status: AC | PRN
  Administered 2017-04-25 (×2): 1 mg via INTRAVENOUS

## 2017-04-25 MED ORDER — HYDROMORPHONE HCL 1 MG/ML IJ SOLN
1.0000 mg | Freq: Once | INTRAMUSCULAR | Status: AC
Start: 2017-04-25 — End: 2017-04-25
  Administered 2017-04-25: 1 mg via INTRAVENOUS
  Filled 2017-04-25: qty 1

## 2017-04-25 MED ORDER — LIDOCAINE HCL 1 % IJ SOLN
INTRAMUSCULAR | Status: AC
  Filled 2017-04-25: qty 20

## 2017-04-25 MED ORDER — OXYCODONE HCL 5 MG PO TABS
5.0000 mg | ORAL_TABLET | Freq: Four times a day (QID) | ORAL | Status: DC | PRN
  Administered 2017-04-25 – 2017-05-03 (×18): 10 mg via ORAL
  Filled 2017-04-25 (×19): qty 2

## 2017-04-25 MED ORDER — LIDOCAINE HCL 1 % IJ SOLN
INTRAMUSCULAR | Status: AC | PRN
  Administered 2017-04-25: 10 mL via INTRADERMAL

## 2017-04-25 MED ORDER — PROMETHAZINE HCL 25 MG/ML IJ SOLN
12.5000 mg | Freq: Once | INTRAMUSCULAR | Status: AC
  Administered 2017-04-25: 12.5 mg via INTRAVENOUS
  Filled 2017-04-25: qty 1

## 2017-04-25 NOTE — Sedation Documentation (Signed)
Pt resting comfortably with eyes closed. No s/s of pain/discomfort including nonverbal s/s of pain during bx core retrieval

## 2017-04-25 NOTE — Progress Notes (Signed)
GI ATTENDING  Patient in ultrasound. Ruling out portal vein or hepatic vein thrombosis. If negative, no further plans from GI perspective. If positive, please contact us. Will sign off. thank you  Docia Chuck. Geri Semin[place]., M.D. [facility] Division of Gastroenterology

## 2017-04-25 NOTE — Procedures (Signed)
Interventional Radiology Procedure Note  Procedure: US guided renal biopsy  Complications: None  Estimated Blood Loss: < 10 mL  Findings: Left kidney targeted.  Lower pole cortex sampled with 16 G core device.  Three passes made as first pass yielded short core sample.  Venetia Night. Kathlene Cote, M.D Pager:  [phone]

## 2017-04-25 NOTE — Progress Notes (Signed)
Admit: 04/19/2017 LOS: 5  13M Sickle Cell Anemia and CKD with AoCKD and Nephrotic Syndrome  Subjective:  Renal Bx this AM, uneventful Mother in room with patient, updated on status / plans; answered all questions Further increase in SCr, no No N/V/anorexia.   CK WNL C3 and C4 WNL   03/12 0701 - 03/13 0700 In: 250 [I.V.:250] Out: 1175 [Urine:1175]  Filed Weights   04/23/17 0858 04/24/17 0147 04/25/17 0457  Weight: 55.8 kg (123 lb) 58.2 kg (128 lb 4.9 oz) 26.6 kg (58 lb 9.6 oz)    Scheduled Meds: . bisacodyl  5 mg Oral Once  . fentaNYL      . folic acid  1 mg Oral Daily  . lidocaine      . midazolam      . senna-docusate  1 tablet Oral BID  . sodium bicarbonate  650 mg Oral TID   Continuous Infusions: . sodium chloride     PRN Meds:.HYDROcodone-acetaminophen  Current Labs: reviewed  Results for FISHER, HARGADON (MRN [id number]) as of 04/23/2017 12:46  Ref. Range 04/22/2017 18:47  Protein Creatinine Ratio Latest Ref Range: 0.00 - 0.15 mg/mgCre 30.41 (H)     Ref. Range 04/21/2017 15:17  Protein, 24H Urine Latest Ref Range: 50 - 100 mg/day 15,600 (H)   CT A/P 3/7: Bilateral renal parenchymal atrophy, unchanged from prior exam. There is prominence of the left greater than right renal collecting systems and proximal ureter without calcified stone. Urinary bladder is distended without wall thickening. No calcified urolithiasis.  Results for MARISOL, GLAZER (MRN [id number]) as of 04/24/2017 14:26  Ref. Range 04/23/2017 14:45  Kappa, lamda light chain ratio Latest Ref Range: 0.26 - 1.65  1.25   Results for TIMOTHEY, DAHLSTROM (MRN [id number]) as of 04/24/2017 14:26  Ref. Range 04/23/2017 14:45  Kappa, lamda light chain ratio Latest Ref Range: 0.26 - 1.65  1.25   Results for CARLYN, MULLENBACH (MRN [id number]) as of 04/24/2017 14:26  Ref. Range 04/23/2017 14:45  ANA Ab, IFA Unknown Negative  ds DNA Ab Latest Ref Range: 0 - 9 IU/mL <1   Results for AMOR, PACKARD (MRN [id number]) as of 04/24/2017 14:26  Ref. Range 04/23/2017 14:45  RPR Latest Ref Range: Non Reactive  Non Reactive  Hepatitis B Surface Ag Latest Ref Range: Negative  Negative  HCV Ab Latest Ref Range: 0.0 - 0.9 s/co ratio <0.1  HIV Screen 4th Generation wRfx Latest Ref Range: Non Reactive  Non Reactive   Physical Exam:  Blood pressure 119/70, pulse (!) 103, temperature 98.1 F (36.7 C), temperature source Oral, resp. rate 18, height 5\' 3"  (1.6 m), weight 26.6 kg (58 lb 9.6 oz), SpO2 100 %. NAD, walking around room RRR  With 2/6 Systolic Murmur CTAB Mildly distended, +BS 1-2+ LEE b/l No rashes/lesions EOMI Nonfocal  A 1. Nonoliguric AoCKD3, new finding of nephrotic syndrome; not uremic; negative serologies, unclear etiology; ? ATN + GN 1. Renal Bx 04/25/16 2. Profound hypoalbuminemia likely related #1 3. Flank / abd pain + bloating, edema in legs and genitals; CT A/P with "colitis";CSY 04/23/17 negative 4. Sickle Cell Anemia 5. Chronic metaboilc acidosis on NaHCO3 related to #1 and #3  P 1. I suspect many of his symptoms related to progressive ascites/edema from his nephrotic syndrome; most likely this is FSGS but this is uncertain 2. Progressive decline in GFR, not uremic, but getting closer to HD; discussed with patient/mother.   3. Hold ACEi for  now 4. Serologies negative, complements negative 5. Daily weights, Daily Renal Panel, Strict I/Os, Avoid nephrotoxins (NSAIDs, judicious IV Contrast)  6. Will cont to follow closely    Pearson Grippe MD 04/25/2017, 2:15 PM  Recent Labs  Lab 04/22/17 0801 04/23/17 1445 04/24/17 1200 04/25/17 0436  NA 136 134* 140 140  K 4.6 4.7 4.4 4.4  CL 110 108 112* 113*  CO2 18* 18* 18* 17*  GLUCOSE 94 99 98 95  BUN 18 25* 34* 37*  CREATININE 3.43* 4.52* 5.83* 6.35*  CALCIUM 7.5* 6.9* 7.3* 7.1*  PHOS 4.1  --  6.4* 5.9*   Recent Labs  Lab 04/21/17 1019 04/24/17 1200 04/25/17 0437  WBC 16.2* 17.7* 17.1*  NEUTROABS 8.2*  7.9* 8.2*  HGB 7.7* 6.0* 7.8*  HCT 21.8* 16.9* 22.5*  MCV 97.3 94.9 93.4  PLT 127* 107* 128*

## 2017-04-26 LAB — RENAL FUNCTION PANEL
ANION GAP: 9 (ref 5–15)
Albumin: 1.1 g/dL — ABNORMAL LOW (ref 3.5–5.0)
BUN: 44 mg/dL — ABNORMAL HIGH (ref 6–20)
CHLORIDE: 113 mmol/L — AB (ref 101–111)
CO2: 14 mmol/L — ABNORMAL LOW (ref 22–32)
Calcium: 7.4 mg/dL — ABNORMAL LOW (ref 8.9–10.3)
Creatinine, Ser: 7.06 mg/dL — ABNORMAL HIGH (ref 0.61–1.24)
GFR calc Af Amer: 11 mL/min — ABNORMAL LOW (ref >60)
GFR, EST NON AFRICAN AMERICAN: 9 mL/min — AB (ref >60)
Glucose, Bld: 104 mg/dL — ABNORMAL HIGH (ref 65–99)
POTASSIUM: 4.4 mmol/L (ref 3.5–5.1)
Phosphorus: 6.1 mg/dL — ABNORMAL HIGH (ref 2.5–4.6)
Sodium: 136 mmol/L (ref 135–145)

## 2017-04-26 LAB — CBC
HEMATOCRIT: 21.2 % — AB (ref 39.0–52.0)
HEMOGLOBIN: 7.4 g/dL — AB (ref 13.0–17.0)
MCH: 32.3 pg (ref 26.0–34.0)
MCHC: 34.9 g/dL (ref 30.0–36.0)
MCV: 92.6 fL (ref 78.0–100.0)
Platelets: 107 10*3/uL — ABNORMAL LOW (ref 150–400)
RBC: 2.29 MIL/uL — AB (ref 4.22–5.81)
RDW: 20.8 % — ABNORMAL HIGH (ref 11.5–15.5)
WBC: 18.4 10*3/uL — AB (ref 4.0–10.5)

## 2017-04-26 LAB — ERYTHROPOIETIN: Erythropoietin: 7.7 m[IU]/mL (ref 2.6–18.5)

## 2017-04-26 MED ORDER — PROMETHAZINE HCL 25 MG/ML IJ SOLN
12.5000 mg | Freq: Once | INTRAMUSCULAR | Status: AC
  Administered 2017-04-26: 12.5 mg via INTRAVENOUS
  Filled 2017-04-26: qty 1

## 2017-04-26 MED ORDER — HYDROMORPHONE HCL 1 MG/ML IJ SOLN
1.0000 mg | INTRAMUSCULAR | Status: DC | PRN
  Administered 2017-04-26 – 2017-05-03 (×49): 1 mg via INTRAVENOUS
  Filled 2017-04-26 (×50): qty 1

## 2017-04-26 NOTE — Progress Notes (Signed)
Admit: 04/19/2017 LOS: 6  22M Sickle Cell Anemia and CKD with AoCKD and Nephrotic Syndrome  Subjective:  Had some nausea overnight SCr further worsened this AM, K ok Hb stable post biopsy; denies hematuria  03/13 0701 - 03/14 0700 In: 540 [P.O.:540] Out: 1325 [Urine:1325]  Filed Weights   04/24/17 0147 04/25/17 0457 04/26/17 0534  Weight: 58.2 kg (128 lb 4.9 oz) 26.6 kg (58 lb 9.6 oz) 57.4 kg (126 lb 8.7 oz)    Scheduled Meds: . bisacodyl  5 mg Oral Once  . folic acid  1 mg Oral Daily  . senna-docusate  1 tablet Oral BID  . sodium bicarbonate  650 mg Oral TID   Continuous Infusions: . sodium chloride Stopped (04/25/17 2200)   PRN Meds:.HYDROmorphone (DILAUDID) injection, oxyCODONE  Current Labs: reviewed  Results for FUE, CERVENKA (MRN [id number]) as of 04/23/2017 12:46  Ref. Range 04/22/2017 18:47  Protein Creatinine Ratio Latest Ref Range: 0.00 - 0.15 mg/mgCre 30.41 (H)     Ref. Range 04/21/2017 15:17  Protein, 24H Urine Latest Ref Range: 50 - 100 mg/day 15,600 (H)   CT A/P 3/7: Bilateral renal parenchymal atrophy, unchanged from prior exam. There is prominence of the left greater than right renal collecting systems and proximal ureter without calcified stone. Urinary bladder is distended without wall thickening. No calcified urolithiasis.  Results for TORRES, HARDENBROOK (MRN [id number]) as of 04/24/2017 14:26  Ref. Range 04/23/2017 14:45  Kappa, lamda light chain ratio Latest Ref Range: 0.26 - 1.65  1.25   Results for DEANDRAE, WAJDA (MRN [id number]) as of 04/24/2017 14:26  Ref. Range 04/23/2017 14:45  Kappa, lamda light chain ratio Latest Ref Range: 0.26 - 1.65  1.25   Results for ISA, KOHLENBERG (MRN [id number]) as of 04/24/2017 14:26  Ref. Range 04/23/2017 14:45  ANA Ab, IFA Unknown Negative  ds DNA Ab Latest Ref Range: 0 - 9 IU/mL <1   Results for YOGESH, COMINSKY (MRN [id number]) as of 04/24/2017 14:26  Ref. Range 04/23/2017 14:45   RPR Latest Ref Range: Non Reactive  Non Reactive  Hepatitis B Surface Ag Latest Ref Range: Negative  Negative  HCV Ab Latest Ref Range: 0.0 - 0.9 s/co ratio <0.1  HIV Screen 4th Generation wRfx Latest Ref Range: Non Reactive  Non Reactive   Physical Exam:  Blood pressure 123/76, pulse (!) 101, temperature 98.5 F (36.9 C), temperature source Oral, resp. rate 16, height 5\' 3"  (1.6 m), weight 57.4 kg (126 lb 8.7 oz), SpO2 100 %. NAD, walking around room RRR  With 2/6 Systolic Murmur CTAB Mildly distended, +BS 1-2+ LEE b/l No rashes/lesions EOMI Nonfocal  A 1. Nonoliguric AoCKD3, new finding of nephrotic syndrome; not uremic; negative serologies, unclear etiology; ? ATN + GN not defined 1. Renal Bx 04/25/16 path pending 2. ? Developing some uremia 2. Profound hypoalbuminemia likely related #1 3. Flank / abd pain + bloating, edema in legs and genitals; CT A/P with "colitis";CSY 04/23/17 negative 4. Sickle Cell Anemia 5. Chronic metaboilc acidosis on NaHCO3 related to #1 and #3  P 1. I suspect many of his symptoms related to pedema from his nephrotic syndrome; 2. Progressive decline in GFR, now with ? Onset of uremia, needs to move to [facility] where we can do HD if / when needed 3. Hold ACEi for now 4. Serologies negative, complements negative 5. Daily weights, Daily Renal Panel, Strict I/Os, Avoid nephrotoxins (NSAIDs, judicious IV Contrast)  6. Will cont to  follow closely    Pearson Grippe MD 04/26/2017, 10:34 AM  Recent Labs  Lab 04/24/17 1200 04/25/17 0436 04/26/17 0847  NA 140 140 136  K 4.4 4.4 4.4  CL 112* 113* 113*  CO2 18* 17* 14*  GLUCOSE 98 95 104*  BUN 34* 37* 44*  CREATININE 5.83* 6.35* 7.06*  CALCIUM 7.3* 7.1* 7.4*  PHOS 6.4* 5.9* 6.1*   Recent Labs  Lab 04/21/17 1019 04/24/17 1200 04/25/17 0437 04/25/17 1520 04/26/17 0847  WBC 16.2* 17.7* 17.1* 16.8* 18.4*  NEUTROABS 8.2* 7.9* 8.2*  --   --   HGB 7.7* 6.0* 7.8* 7.4* 7.4*  HCT 21.8* 16.9* 22.5* 21.5*  21.2*  MCV 97.3 94.9 93.4 92.3 92.6  PLT 127* 107* 128* 118* 107*

## 2017-04-26 NOTE — Progress Notes (Signed)
PT mother is upset. States that she wants her son to go to [place].  Mother says that she spoke to DR. Adams the on call hematologist at [place] to see if pt can admitted to be transferred to [facility] soon. Pt mother gave on call hematologist number [phone]). States that she want someone to consult the [place] hematologist. The pt mother [name] [name] ([phone]) also wants a provider to call her and speak about sons care.

## 2017-04-26 NOTE — Progress Notes (Signed)
New Transfer Note:   Arrival Method: Patient arrived from [facility] via Care link transport Mental Orientation: Alert and oriented x4 Telemetry: N/A Assessment: Completed Skin: See decflowsheet IV: R IJ TLC Pain: Denies Tubes: N/A Safety Measures: Safety Fall Prevention Plan has been given, discussed Admission: Completed 90M Orientation: Patient has been orientated to the room, unit and staff.  Family: Mother at bedside  Orders have been reviewed and implemented. Will continue to monitor the patient. Call light has been placed within reach and bed alarm has been activated.   Areli Jowett [employer], RN-BC Phone number: [phone]

## 2017-04-26 NOTE — Progress Notes (Signed)
Subjective: This is a 37-year- gentleman with sickle cell disease but also acute on chronic kidney disease stage IV that has progressed to probable end-stage  That may require hemodialysis.  Patient is complaining of pain as 7 out 10 in his back. Patient has been having progressive worsening of renal function. Currently requiring possible dialysis but not acidotic. Hemoglobin remained stable.  Objective: Vital signs in last 24 hours: Temp:  [98 F (36.7 C)-99 F (37.2 C)] 98.8 F (37.1 C) (03/14 0534) Pulse Rate:  [88-118] 102 (03/14 0534) Resp:  [14-19] 16 (03/14 0534) BP: (104-119)/(66-85) 119/82 (03/14 0534) SpO2:  [99 %-100 %] 100 % (03/14 0534) Weight:  [57.4 kg (126 lb 8.7 oz)] 57.4 kg (126 lb 8.7 oz) (03/14 0534) Weight change: 30.8 kg (67 lb 15.1 oz) Last BM Date: 04/25/17  Intake/Output from previous day: 03/13 0701 - 03/14 0700 In: 540 [P.O.:540] Out: 1325 [Urine:1325] Intake/Output this shift: No intake/output data recorded.  General appearance: alert, cooperative and no distress Head: Normocephalic, without obvious abnormality, atraumatic Back: symmetric, no curvature. ROM normal. No CVA tenderness. Resp: clear to auscultation bilaterally Chest wall: no tenderness Cardio: regular rate and rhythm, S1, S2 normal, no murmur, click, rub or gallop GI: soft, non-tender; bowel sounds normal; no masses,  no organomegaly Extremities: extremities normal, atraumatic, no cyanosis or edema Pulses: 2+ and symmetric Skin: Skin color, texture, turgor normal. No rashes or lesions Neurologic: Grossly normal  Lab Results: Recent Labs    04/25/17 0437 04/25/17 1520  WBC 17.1* 16.8*  HGB 7.8* 7.4*  HCT 22.5* 21.5*  PLT 128* 118*   BMET Recent Labs    04/24/17 1200 04/25/17 0436  NA 140 140  K 4.4 4.4  CL 112* 113*  CO2 18* 17*  GLUCOSE 98 95  BUN 34* 37*  CREATININE 5.83* 6.35*  CALCIUM 7.3* 7.1*    Studies/Results: US Abdomen Complete  Result Date:  04/25/2017 CLINICAL DATA:  Abdominal pain with ascites. EXAM: ABDOMEN ULTRASOUND COMPLETE DUPLEX LIVER ULTRASOUND TECHNIQUE: Grayscale and color ultrasound of the abdomen was performed. Color and duplex Doppler ultrasound was also performed to evaluate the hepatic in-flow and out-flow vessels. COMPARISON:  CT abdomen pelvis dated April 19, 2017. FINDINGS: Gallbladder: Surgically absent. Common bile duct: Diameter: 3 mm, normal Liver: No focal lesion identified. Increased in parenchymal echogenicity. Portal vein is patent on color Doppler imaging with normal direction of blood flow towards the liver. IVC: No abnormality visualized. Pancreas: Visualized portion unremarkable. Spleen: Surgically absent. Right Kidney: Length: 10.4 cm. Echogenicity within normal limits. No mass or hydronephrosis visualized. Left Kidney: Length: 9.2 cm. Echogenicity within normal limits. No mass or hydronephrosis visualized. Abdominal aorta: No aneurysm visualized. Other findings: None. Portal Vein Velocities Main: 58.2 cm/sec, hepatopetal Right: 16.4 cm/sec, hepatopetal Left: 25.5 cm/sec, hepatopetal Hepatic Vein Velocities Right: 26.5 cm/sec, hepatofugal Middle: 52.3 cm/sec, hepatofugal Left: 36.5 cm/sec, hepatofugal Hepatic Artery Velocity: 50.4 cm/sec Varices: Absent. Ascites: Trace perihepatic ascites. IMPRESSION: 1. Increased hepatic parenchymal echogenicity as can be seen with liver disease. No focal abnormality. Trace perihepatic ascites. 2. Normal velocities and direction of flow within the hepatic vessels. Electronically Signed   By: Titus Dubin M.D.   On: 04/25/2017 14:20   Dg Chest Port 1 View  Result Date: 04/24/2017 CLINICAL DATA:  Central catheter placement EXAM: PORTABLE CHEST 1 VIEW COMPARISON:  April 07, 2017 FINDINGS: Central catheter tip is in the superior vena cava near the cavoatrial junction. No pneumothorax. There is patchy bibasilar lung atelectatic change. Lungs elsewhere are  clear. Heart size and  pulmonary vascularity are normal. No adenopathy. There is avascular necrosis in the right humeral head. IMPRESSION: 1. Central catheter tip in superior vena cava near cavoatrial junction. No pneumothorax. 2. Bibasilar atelectasis, slightly more on the left than the right. Earliest changes of pneumonia cannot be excluded. 3.  Avascular necrosis right humeral head. Electronically Signed   By: Lowella Grip III M.D.   On: 04/24/2017 11:54   US Biopsy (kidney)  Result Date: 04/25/2017 CLINICAL DATA:  Six anemia, acute on chronic kidney disease and nephrotic syndrome. Renal biopsy requested. EXAM: ULTRASOUND GUIDED CORE BIOPSY OF LEFT KIDNEY MEDICATIONS: 2.0 mg IV Versed; 150 mcg IV Fentanyl Total Moderate Sedation Time: 16 minutes. The patient's level of consciousness and physiologic status were continuously monitored during the procedure by Radiology nursing. PROCEDURE: The procedure, risks, benefits, and alternatives were explained to the patient. Questions regarding the procedure were encouraged and answered. The patient understands and consents to the procedure. A time out was performed prior to initiating the procedure. Both kidneys were examined by ultrasound. The left flank region was prepped with chlorhexidine in a sterile fashion, and a sterile drape was applied covering the operative field. A sterile gown and sterile gloves were used for the procedure. Local anesthesia was provided with 1% Lidocaine. Samples of the left lower pole renal cortex were obtained with a 16 gauge core device. Three passes with the core biopsy device were performed and samples sent in saline. COMPLICATIONS: None. FINDINGS: Both kidneys are well visualized by ultrasound. The left kidney was chosen as it is slightly more superficial in location from a posterior approach and also demonstrated slightly thicker renal cortex compared to the right. The first core biopsy sample was short in length. Two additional samples were  therefore obtained in order to have a sufficient amount of tissue for pathologic analysis. IMPRESSION: Ultrasound-guided core biopsy performed at the level of left lower pole renal cortex. Electronically Signed   By: Aletta Edouard M.D.   On: 04/25/2017 13:18   US Abdominal Pelvic Art/vent Flow Doppler  Result Date: 04/25/2017 CLINICAL DATA:  Abdominal pain with ascites. EXAM: ABDOMEN ULTRASOUND COMPLETE DUPLEX LIVER ULTRASOUND TECHNIQUE: Grayscale and color ultrasound of the abdomen was performed. Color and duplex Doppler ultrasound was also performed to evaluate the hepatic in-flow and out-flow vessels. COMPARISON:  CT abdomen pelvis dated April 19, 2017. FINDINGS: Gallbladder: Surgically absent. Common bile duct: Diameter: 3 mm, normal Liver: No focal lesion identified. Increased in parenchymal echogenicity. Portal vein is patent on color Doppler imaging with normal direction of blood flow towards the liver. IVC: No abnormality visualized. Pancreas: Visualized portion unremarkable. Spleen: Surgically absent. Right Kidney: Length: 10.4 cm. Echogenicity within normal limits. No mass or hydronephrosis visualized. Left Kidney: Length: 9.2 cm. Echogenicity within normal limits. No mass or hydronephrosis visualized. Abdominal aorta: No aneurysm visualized. Other findings: None. Portal Vein Velocities Main: 58.2 cm/sec, hepatopetal Right: 16.4 cm/sec, hepatopetal Left: 25.5 cm/sec, hepatopetal Hepatic Vein Velocities Right: 26.5 cm/sec, hepatofugal Middle: 52.3 cm/sec, hepatofugal Left: 36.5 cm/sec, hepatofugal Hepatic Artery Velocity: 50.4 cm/sec Varices: Absent. Ascites: Trace perihepatic ascites. IMPRESSION: 1. Increased hepatic parenchymal echogenicity as can be seen with liver disease. No focal abnormality. Trace perihepatic ascites. 2. Normal velocities and direction of flow within the hepatic vessels. Electronically Signed   By: Titus Dubin M.D.   On: 04/25/2017 14:20    Medications: I have reviewed  the patient's current medications.  Assessment/Plan: A 37 year old gentleman with sickle cell painful crisis and  acute on chronic kidney failure  #1 acute on chronic kidney failure: Patient appears to be having worsening renal function. Discuss with nephrology. Patient will be transferred to [facility] improved patient for possible hemodialysis. We'll follow his creatinine closely. Potassium appeared stable at the moment. Patient has nephrotic syndrome.  #2 sickle cell crisis: Patient is having more pain now that he had renal biopsy. I will add Dilaudid 2 mg every 3 hours when necessary for breakthrough pain. Continue with oral medications.  #3 scrotal swelling: Secondary to nephrotic syndrome. This appears improving. Patient also has mild ascites.  #4 thrombocytopenia: Appears improving. Continue monitoring.  #5 diarrhea with elevated alkaline phosphatase: GI has signed off. No obvious cause.  #6 chronic pain syndrome: Continue pain management as indicated above.  LOS: 6 days   [name] [name],LAWAL 04/26/2017, 7:28 AM

## 2017-04-27 LAB — CBC WITH DIFFERENTIAL/PLATELET
BASOS PCT: 0 %
Basophils Absolute: 0 10*3/uL (ref 0.0–0.1)
EOS ABS: 0 10*3/uL (ref 0.0–0.7)
Eosinophils Relative: 0 %
HEMATOCRIT: 21.7 % — AB (ref 39.0–52.0)
Hemoglobin: 7.3 g/dL — ABNORMAL LOW (ref 13.0–17.0)
LYMPHS ABS: 5.5 10*3/uL — AB (ref 0.7–4.0)
Lymphocytes Relative: 28 %
MCH: 31.1 pg (ref 26.0–34.0)
MCHC: 33.6 g/dL (ref 30.0–36.0)
MCV: 92.3 fL (ref 78.0–100.0)
MONO ABS: 2.5 10*3/uL — AB (ref 0.1–1.0)
Monocytes Relative: 13 %
NEUTROS PCT: 59 %
Neutro Abs: 11.5 10*3/uL — ABNORMAL HIGH (ref 1.7–7.7)
PLATELETS: 139 10*3/uL — AB (ref 150–400)
RBC: 2.35 MIL/uL — AB (ref 4.22–5.81)
RDW: 20.5 % — AB (ref 11.5–15.5)
WBC: 19.5 10*3/uL — AB (ref 4.0–10.5)

## 2017-04-27 LAB — COMPREHENSIVE METABOLIC PANEL
ALT: 27 U/L (ref 17–63)
ANION GAP: 12 (ref 5–15)
AST: 55 U/L — ABNORMAL HIGH (ref 15–41)
Albumin: 1.2 g/dL — ABNORMAL LOW (ref 3.5–5.0)
Alkaline Phosphatase: 177 U/L — ABNORMAL HIGH (ref 38–126)
BILIRUBIN TOTAL: 1.8 mg/dL — AB (ref 0.3–1.2)
BUN: 53 mg/dL — AB (ref 6–20)
CHLORIDE: 112 mmol/L — AB (ref 101–111)
CO2: 11 mmol/L — ABNORMAL LOW (ref 22–32)
Calcium: 7.8 mg/dL — ABNORMAL LOW (ref 8.9–10.3)
Creatinine, Ser: 8.39 mg/dL — ABNORMAL HIGH (ref 0.61–1.24)
GFR calc Af Amer: 9 mL/min — ABNORMAL LOW (ref >60)
GFR, EST NON AFRICAN AMERICAN: 7 mL/min — AB (ref >60)
Glucose, Bld: 99 mg/dL (ref 65–99)
POTASSIUM: 4.7 mmol/L (ref 3.5–5.1)
Sodium: 135 mmol/L (ref 135–145)
TOTAL PROTEIN: 4.3 g/dL — AB (ref 6.5–8.1)

## 2017-04-27 LAB — MRSA PCR SCREENING: MRSA by PCR: NEGATIVE

## 2017-04-27 MED ORDER — SODIUM CHLORIDE 0.9% FLUSH
10.0000 mL | Freq: Two times a day (BID) | INTRAVENOUS | Status: DC
  Administered 2017-04-27 – 2017-05-02 (×6): 10 mL

## 2017-04-27 MED ORDER — SODIUM CHLORIDE 0.9% FLUSH
10.0000 mL | INTRAVENOUS | Status: DC | PRN
  Administered 2017-04-27: 20 mL
  Administered 2017-05-03: 30 mL
  Filled 2017-04-27 (×2): qty 40

## 2017-04-27 NOTE — Progress Notes (Signed)
Patient ID: [name] [name]., male   DOB: May 16, 1980, 37 y.o.   MRN: [id number]   Must reschedule HD cath til 3/16 am  RN aware Pt can resume diet and npo after MN

## 2017-04-27 NOTE — Care Management Note (Signed)
Case Management Note  Patient Details  Name: [name] [name]. MRN: [id number] Date of Birth: January 16, 1981  Subjective/Objective:    History of sickle cell anemia with chronic kidney disease secondary to sickle cell disease, admitted for Sickle cell pain crisis.    Also has 74M Sickle Cell Anemia and CKD with AoCKD and Nephrotic Syndrome. Action/Plan: GI consulted-  colonoscopy: Evolving colitis over 2 weeks. CT also now showing duodenal inflammation.  3/13 Renal biopsy pending (Ruling out portal vein or hepatic vein thrombosis).  Nephrology consulted-currently requiring possible dialysis but not acidotic. IR consulted- help with [facility] placement.Tentative will need HD tomorrow (04/28/17) if not improved. NCM will continue to follow for discharge transition needs.  Expected Discharge Date:  (unknown)               Expected Discharge Plan:  Home/Self Care  Discharge planning Services  CM Consult  Status of Service:  In process, will continue to follow  Kristen Cardinal, RN  Nurse case [place] 04/27/2017, 3:09 PM

## 2017-04-27 NOTE — Consult Note (Signed)
Chief Complaint: Patient was seen in consultation today for  Tunneled dialysis catheter placement Chief Complaint  Patient presents with  . Sickle Cell Pain Crisis  . Abdominal Pain   at the request of Dr Alfonso Ellis  Supervising Physician: Aletta Edouard  Patient Status: [facility] - In-pt  History of Present Illness: [name] [name]. is a 37 y.o. male   Sickle cell anemia Chronic kidney disease with A/CKD Nephrotic syndrome ATN; glomerulomegaly Arteriosclerosis Cr 8.4- progressive rise  Dr Joelyn Oms requesting tunneled hemodialysis catheter placement Afeb Steady high wbc-- non infectious per Dr Jonelle Sidle and Dr Joelyn Oms Feel related to Sickle cell disease     Past Medical History:  Diagnosis Date  . Arachnoid Cyst of brain bilaterally    "2 really small ones in the back of my head; inside; saw them w/MRI" (09/25/2012)  . Bacterial pneumonia ~ 2012   "caught it here in the hospital" (09/25/2012)  . Chronic kidney disease    "from my sickle cell" (09/25/2012)  . CKD (chronic kidney disease), stage II   . GERD (gastroesophageal reflux disease)    "after I eat alot of spicey foods" (09/25/2012)  . Gynecomastia, male 07/10/2012  . History of blood transfusion    "always related to sickle cell crisis" (09/25/2012)  . 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   Noted in noted from Hematology notes at [facility]  . Migraines    "take RX qd to prevent them" (09/25/2012)  . Sickle cell anemia (HCC)   . Sickle cell crisis ([place]) 09/25/2012  . Sickle cell nephropathy ([place]) 07/10/2012  . Sinus tachycardia   . Tachycardia with heart rate 121-140 beats per minute with ambulation 08/04/2016    Past Surgical History:  Procedure Laterality Date  . CHOLECYSTECTOMY  ~ 2012  . COLONOSCOPY N/A 04/23/2017   Procedure: COLONOSCOPY;  Surgeon: Irene Shipper, MD;  Location: [facility];  Service: Endoscopy;  Laterality: N/A;  . IR FLUORO GUIDE CV LINE RIGHT  12/17/2016  . IR REMOVAL  TUN CV CATH W/O FL  12/21/2016  . IR US GUIDE VASC ACCESS RIGHT  12/17/2016  . spleenectomy      Allergies: Nsaids and Morphine and related  Medications: Prior to Admission medications   Medication Sig Start Date End Date Taking? Authorizing Provider  enalapril (VASOTEC) 5 MG tablet Take 5 mg by mouth daily. 12/09/16  Yes [provider]  folic acid (FOLVITE) 1 MG tablet Take 1 tablet (1 mg total) daily by mouth. 12/22/16  Yes Leana Gamer, MD  nortriptyline (PAMELOR) 25 MG capsule TAKE 1 CAPSULE (25 MG TOTAL) BY MOUTH AT BEDTIME. 01/18/17  Yes Donnamae Jude, MD  Oxycodone HCl 10 MG TABS Take 1 tablet (10 mg total) by mouth every 6 (six) hours as needed. 03/04/17  Yes [name], MD  sodium bicarbonate 650 MG tablet Take 1 tablet (650 mg total) 3 (three) times daily by mouth. 12/21/16  Yes Leana Gamer, MD  Menthol-Methyl Salicylate (MUSCLE RUB) 10-15 % CREA Apply 1 application as needed topically for muscle pain. Patient not taking: Reported on 04/19/2017 12/21/16   Leana Gamer, MD     Family History  Problem Relation Age of Onset  . Breast cancer Mother     Social History   Socioeconomic History  . Marital status: Single    Spouse name: None  . Number of children: None  . Years of education: None  . Highest education level: None  Social Needs  .  Financial resource strain: None  . Food insecurity - worry: None  . Food insecurity - inability: None  . Transportation needs - medical: None  . Transportation needs - non-medical: None  Occupational History  . None  Tobacco Use  . Smoking status: Current Some Day Smoker    Types: Cigarettes  . Smokeless tobacco: Never Used  . Tobacco comment: 09/25/2012 "I don't buy cigarettes; bum one from friends q now and then"  Substance and Sexual Activity  . Alcohol use: No    Alcohol/week: 0.0 oz    Frequency: Never  . Drug use: No    Comment: 09/25/2012 "weed was my biggest problem; stopped that ~  1 1/2 months ago; I don't do cocaine; can't say I never have though"  . Sexual activity: Yes  Other Topics Concern  . None  Social History Narrative    Lives alone in [place].   Back at school, studying Public relations account executive. Unemployed.    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     Review of Systems: A 12 point ROS discussed and pertinent positives are indicated in the HPI above.  All other systems are negative.  Review of Systems  Constitutional: Positive for activity change, appetite change and fatigue. Negative for fever.  Respiratory: Positive for shortness of breath. Negative for cough.   Gastrointestinal: Negative for abdominal pain.  Genitourinary: Positive for flank pain.  Neurological: Positive for weakness.  Psychiatric/Behavioral: Positive for decreased concentration. Negative for behavioral problems.    Vital Signs: BP 107/64 (BP Location: Left Arm)   Pulse (!) 112   Temp 98 F (36.7 C) (Oral)   Resp 18   Ht 5\' 3"  (1.6 m)   Wt 121 lb 7.6 oz (55.1 kg)   SpO2 97%   BMI 21.52 kg/m   Physical Exam  Constitutional: He is oriented to person, place, and time.  Cardiovascular: Normal rate and regular rhythm.  Pulmonary/Chest: Effort normal.  Abdominal: Normal appearance. There is no tenderness.  Neurological: He is alert and oriented to person, place, and time.  Skin: Skin is warm and dry.  Psychiatric: He has a normal mood and affect. His behavior is normal.  Nursing note and vitals reviewed.   Imaging: US Abdomen Complete  Result Date: 04/25/2017 CLINICAL DATA:  Abdominal pain with ascites. EXAM: ABDOMEN ULTRASOUND COMPLETE DUPLEX LIVER ULTRASOUND TECHNIQUE: Grayscale and color ultrasound of the abdomen was performed. Color and duplex Doppler ultrasound was also performed to evaluate the hepatic in-flow and out-flow  vessels. COMPARISON:  CT abdomen pelvis dated April 19, 2017. FINDINGS: Gallbladder: Surgically absent. Common bile duct: Diameter: 3 mm, normal Liver: No focal lesion identified. Increased in parenchymal echogenicity. Portal vein is patent on color Doppler imaging with normal direction of blood flow towards the liver. IVC: No abnormality visualized. Pancreas: Visualized portion unremarkable. Spleen: Surgically absent. Right Kidney: Length: 10.4 cm. Echogenicity within normal limits. No mass or hydronephrosis visualized. Left Kidney: Length: 9.2 cm. Echogenicity within normal limits. No mass or hydronephrosis visualized. Abdominal aorta: No aneurysm visualized. Other findings: None. Portal Vein Velocities Main: 58.2 cm/sec, hepatopetal Right: 16.4 cm/sec, hepatopetal Left: 25.5 cm/sec, hepatopetal Hepatic Vein Velocities Right: 26.5 cm/sec, hepatofugal Middle: 52.3 cm/sec, hepatofugal Left: 36.5 cm/sec, hepatofugal Hepatic Artery Velocity: 50.4 cm/sec Varices: Absent. Ascites: Trace perihepatic ascites. IMPRESSION: 1. Increased hepatic parenchymal echogenicity as can be seen with  liver disease. No focal abnormality. Trace perihepatic ascites. 2. Normal velocities and direction of flow within the hepatic vessels. Electronically Signed   By: Titus Dubin M.D.   On: 04/25/2017 14:20   US Abdomen Limited  Result Date: 04/23/2017 CLINICAL DATA:  Ascites seen on CT abdomen and pelvis 04/19/2017. Evaluate for paracentesis. EXAM: LIMITED ABDOMEN ULTRASOUND FOR ASCITES TECHNIQUE: Limited ultrasound survey for ascites was performed in all four abdominal quadrants. COMPARISON:  None. FINDINGS: Very little ascitic fluid is identified. The patient is not a candidate for paracentesis. IMPRESSION: As above. Electronically Signed   By: Inge Rise M.D.   On: 04/23/2017 16:05   Dg Chest Port 1 View  Result Date: 04/24/2017 CLINICAL DATA:  Central catheter placement EXAM: PORTABLE CHEST 1 VIEW COMPARISON:  April 07, 2017 FINDINGS: Central catheter tip is in the superior vena cava near the cavoatrial junction. No pneumothorax. There is patchy bibasilar lung atelectatic change. Lungs elsewhere are clear. Heart size and pulmonary vascularity are normal. No adenopathy. There is avascular necrosis in the right humeral head. IMPRESSION: 1. Central catheter tip in superior vena cava near cavoatrial junction. No pneumothorax. 2. Bibasilar atelectasis, slightly more on the left than the right. Earliest changes of pneumonia cannot be excluded. 3.  Avascular necrosis right humeral head. Electronically Signed   By: Lowella Grip III M.D.   On: 04/24/2017 11:54   Dg Chest Port 1 View  Result Date: 04/07/2017 CLINICAL DATA:  Sickle cell pain crisis.  Right flank pain. EXAM: PORTABLE CHEST 1 VIEW COMPARISON:  02/27/2017 FINDINGS: The heart is normal in size allowing for AP technique and low lung volumes. Lungs are under aerated with bibasilar atelectasis. No pneumothorax or pleural effusion. There are sclerotic changes and articular surface collapse involving the right humeral head consistent with avascular necrosis and bone infarct. IMPRESSION: Bibasilar atelectasis. Bony changes related to sickle cell disorder and bone infarct. Electronically Signed   By: Marybelle Killings M.D.   On: 04/07/2017 14:03   Ct Renal Stone Study  Result Date: 04/19/2017 CLINICAL DATA:  Flank pain. Stone disease suspected. Diarrhea and abdominal discomfort. Recent hospital admission for colitis. No improvement, now with weakness. EXAM: CT ABDOMEN AND PELVIS WITHOUT CONTRAST TECHNIQUE: Multidetector CT imaging of the abdomen and pelvis was performed following the standard protocol without IV contrast. COMPARISON:  CT 04/07/2017 FINDINGS: Lower chest: Bibasilar scarring with slight improved lung aeration from prior exam. Trace bilateral pleural thickening. Decreased density of the blood pool consistent with anemia. Hepatobiliary: No focal hepatic lesion. Clips  in the gallbladder fossa postcholecystectomy. No biliary dilatation. Pancreas: No ductal dilatation. No definite peripancreatic fat stranding, however mild generalized edema of the upper mesentery. Spleen: Tiny calcified spleen, sequela of sickle cell, unchanged. Adrenals/Urinary Tract: Normal adrenal glands. Bilateral renal parenchymal atrophy, unchanged from prior exam. There is prominence of the left greater than right renal collecting systems and proximal ureter without calcified stone. Urinary bladder is distended without wall thickening. No calcified urolithiasis. Stomach/Bowel: Persistent and slight worsening colonic wall thickening of the ascending and transverse colon, now with involvement of the mid and proximal descending colon. No evidence of perforation. Possible wall thickening of the duodenum, suboptimally assessed without enteric contrast and presence of intra-abdominal ascites. No other small bowel inflammation. Normal appendix. Vascular/Lymphatic: Innumerable central mesenteric nodes that are not enlarged by size criteria. Decreased blood pool density consistent with anemia. Reproductive: Prostate is unremarkable. Other: Increased abdominopelvic ascites from prior, small to moderate. Increased mesenteric edema. No free air. No  evidence of intra-abdominal abscess. Musculoskeletal: Diffuse osteosclerosis consistent with history of sickle cell. Multiple small endplate depressions throughout spine. No acute finding. IMPRESSION: 1. Persistent/worsening colitis with colonic wall thickening of the ascending and transverse colon, new involvement of the descending colon. Possible new wall thickening of the duodenum. Findings may be infectious or inflammatory. 2. Increased abdominopelvic ascites. 3. Prominence of bilateral renal collecting systems without urolithiasis. This may be secondary to urinary bladder distention. Electronically Signed   By: Jeb Levering M.D.   On: 04/19/2017 23:43   Ct Renal  Stone Study  Result Date: 04/07/2017 CLINICAL DATA:  Right-sided flank pain beginning today. Sickle cell disease. EXAM: CT ABDOMEN AND PELVIS WITHOUT CONTRAST TECHNIQUE: Multidetector CT imaging of the abdomen and pelvis was performed following the standard protocol without IV contrast. COMPARISON:  None. FINDINGS: Lower chest: Bibasilar scarring. Asymmetric opacity in left lung base may be due to atelectasis or infiltrate. Hepatobiliary: No mass visualized on this unenhanced exam. Prior cholecystectomy. No evidence of biliary obstruction. Pancreas: No mass or inflammatory process visualized on this unenhanced exam. Spleen: Tiny calcified spleen, consistent with auto splenectomy of sickle cell disease. Adrenals/Urinary tract: Bilateral renal parenchymal atrophy. No evidence of urolithiasis or hydronephrosis. Unremarkable unopacified urinary bladder. Stomach/Bowel: Mild to moderate wall thickening is seen involving the ascending and transverse colon, consistent with colitis. No evidence of bowel obstruction or abscess. Mild ascites noted. Vascular/Lymphatic: No pathologically enlarged lymph nodes identified. No evidence of abdominal aortic aneurysm. Reproductive:  No mass or other significant abnormality. Other:  None. Musculoskeletal: No suspicious bone lesions identified. Diffuse osteosclerosis, consistent with sickle cell disease. IMPRESSION: Colitis involving the ascending and transverse colon.  Mild ascites. No evidence of urolithiasis or hydronephrosis. Mild atelectasis or scarring in left lung base. Diffuse osteosclerosis and auto-splenectomy, consistent with chronic sickle cell disease. Electronically Signed   By: Earle Gell M.D.   On: 04/07/2017 15:56   US Biopsy (kidney)  Result Date: 04/25/2017 CLINICAL DATA:  Six anemia, acute on chronic kidney disease and nephrotic syndrome. Renal biopsy requested. EXAM: ULTRASOUND GUIDED CORE BIOPSY OF LEFT KIDNEY MEDICATIONS: 2.0 mg IV Versed; 150 mcg IV  Fentanyl Total Moderate Sedation Time: 16 minutes. The patient's level of consciousness and physiologic status were continuously monitored during the procedure by Radiology nursing. PROCEDURE: The procedure, risks, benefits, and alternatives were explained to the patient. Questions regarding the procedure were encouraged and answered. The patient understands and consents to the procedure. A time out was performed prior to initiating the procedure. Both kidneys were examined by ultrasound. The left flank region was prepped with chlorhexidine in a sterile fashion, and a sterile drape was applied covering the operative field. A sterile gown and sterile gloves were used for the procedure. Local anesthesia was provided with 1% Lidocaine. Samples of the left lower pole renal cortex were obtained with a 16 gauge core device. Three passes with the core biopsy device were performed and samples sent in saline. COMPLICATIONS: None. FINDINGS: Both kidneys are well visualized by ultrasound. The left kidney was chosen as it is slightly more superficial in location from a posterior approach and also demonstrated slightly thicker renal cortex compared to the right. The first core biopsy sample was short in length. Two additional samples were therefore obtained in order to have a sufficient amount of tissue for pathologic analysis. IMPRESSION: Ultrasound-guided core biopsy performed at the level of left lower pole renal cortex. Electronically Signed   By: Aletta Edouard M.D.   On: 04/25/2017 13:18  US Abdominal Pelvic Art/vent Flow Doppler  Result Date: 04/25/2017 CLINICAL DATA:  Abdominal pain with ascites. EXAM: ABDOMEN ULTRASOUND COMPLETE DUPLEX LIVER ULTRASOUND TECHNIQUE: Grayscale and color ultrasound of the abdomen was performed. Color and duplex Doppler ultrasound was also performed to evaluate the hepatic in-flow and out-flow vessels. COMPARISON:  CT abdomen pelvis dated April 19, 2017. FINDINGS: Gallbladder: Surgically  absent. Common bile duct: Diameter: 3 mm, normal Liver: No focal lesion identified. Increased in parenchymal echogenicity. Portal vein is patent on color Doppler imaging with normal direction of blood flow towards the liver. IVC: No abnormality visualized. Pancreas: Visualized portion unremarkable. Spleen: Surgically absent. Right Kidney: Length: 10.4 cm. Echogenicity within normal limits. No mass or hydronephrosis visualized. Left Kidney: Length: 9.2 cm. Echogenicity within normal limits. No mass or hydronephrosis visualized. Abdominal aorta: No aneurysm visualized. Other findings: None. Portal Vein Velocities Main: 58.2 cm/sec, hepatopetal Right: 16.4 cm/sec, hepatopetal Left: 25.5 cm/sec, hepatopetal Hepatic Vein Velocities Right: 26.5 cm/sec, hepatofugal Middle: 52.3 cm/sec, hepatofugal Left: 36.5 cm/sec, hepatofugal Hepatic Artery Velocity: 50.4 cm/sec Varices: Absent. Ascites: Trace perihepatic ascites. IMPRESSION: 1. Increased hepatic parenchymal echogenicity as can be seen with liver disease. No focal abnormality. Trace perihepatic ascites. 2. Normal velocities and direction of flow within the hepatic vessels. Electronically Signed   By: Titus Dubin M.D.   On: 04/25/2017 14:20    Labs:  CBC: Recent Labs    04/25/17 0437 04/25/17 1520 04/26/17 0847 04/27/17 1037  WBC 17.1* 16.8* 18.4* PENDING  HGB 7.8* 7.4* 7.4* 7.3*  HCT 22.5* 21.5* 21.2* 21.7*  PLT 128* 118* 107* PENDING    COAGS: Recent Labs    04/24/17 1200  INR 1.20  APTT 36    BMP: Recent Labs    04/24/17 1200 04/25/17 0436 04/26/17 0847 04/27/17 1037  NA 140 140 136 135  K 4.4 4.4 4.4 4.7  CL 112* 113* 113* 112*  CO2 18* 17* 14* 11*  GLUCOSE 98 95 104* 99  BUN 34* 37* 44* 53*  CALCIUM 7.3* 7.1* 7.4* 7.8*  CREATININE 5.83* 6.35* 7.06* 8.39*  GFRNONAA 11* 10* 9* 7*  GFRAA 13* 12* 11* 9*    LIVER FUNCTION TESTS: Recent Labs    04/19/17 2035 04/21/17 1019 04/24/17 1200 04/25/17 0436 04/26/17 0847  04/27/17 1037  BILITOT 2.2* 2.5* 1.4*  --   --  1.8*  AST 80* 73* 81*  --   --  55*  ALT 35 29 29  --   --  27  ALKPHOS 197* 184* 165*  --   --  177*  PROT 4.4* 4.3* 3.8*  --   --  4.3*  ALBUMIN 1.3* 1.3* 1.1*  1.1* 1.1* 1.1* 1.2*    TUMOR MARKERS: No results for input(s): AFPTM, CEA, CA199, CHROMGRNA in the last 8760 hours.  Assessment and Plan:  Acute on  Chronic renal disease Sickle cell anemia Cr rising--- worsening function Scheduled for tunneled dialysis catheter placement Risks and benefits discussed with the patient including, but not limited to bleeding, infection, vascular injury, pneumothorax which may require chest tube placement, air embolism or even death  All of the patient's questions were answered, patient is agreeable to proceed. Consent signed and in chart.   Thank you for this interesting consult.  I greatly enjoyed meeting Berton A Shavon Zenz. and look forward to participating in their care.  A copy of this report was sent to the requesting provider on this date.  Electronically Signed: Lavonia Drafts, PA-C 04/27/2017, 12:26 PM   I  spent a total of 20 Minutes    in face to face in clinical consultation, greater than 50% of which was counseling/coordinating care for HD catheter placement

## 2017-04-27 NOTE — Progress Notes (Signed)
Subjective: Patient is doing much better now. Occasional pain in the site of his biopsy but otherwise stable. Renal function continues to get worse. Mom is at bedside and worried. He is on oral OxyContin as well as Dilaudid 2 mg as needed.  Objective: Vital signs in last 24 hours: Temp:  [98 F (36.7 C)-98.6 F (37 C)] 98 F (36.7 C) (03/15 0950) Pulse Rate:  [100-121] 112 (03/15 0950) Resp:  [16-18] 18 (03/15 0950) BP: (106-118)/(64-79) 107/64 (03/15 0950) SpO2:  [96 %-100 %] 97 % (03/15 0950) Weight:  [55.1 kg (121 lb 7.6 oz)] 55.1 kg (121 lb 7.6 oz) (03/14 2224) Weight change: -2.3 kg (-1.1 oz) Last BM Date: 04/25/17  Intake/Output from previous day: 03/14 0701 - 03/15 0700 In: 420 [P.O.:420] Out: 600 [Urine:600] Intake/Output this shift: Total I/O In: -  Out: 750 [Urine:750]  General appearance: alert, cooperative and no distress Head: Normocephalic, without obvious abnormality, atraumatic Back: symmetric, no curvature. ROM normal. No CVA tenderness. Resp: clear to auscultation bilaterally Chest wall: no tenderness Cardio: regular rate and rhythm, S1, S2 normal, no murmur, click, rub or gallop GI: soft, non-tender; bowel sounds normal; no masses,  no organomegaly Extremities: extremities normal, atraumatic, no cyanosis or edema Pulses: 2+ and symmetric Skin: Skin color, texture, turgor normal. No rashes or lesions Neurologic: Grossly normal  Lab Results: Recent Labs    04/26/17 0847 04/27/17 1037  WBC 18.4* 19.5*  HGB 7.4* 7.3*  HCT 21.2* 21.7*  PLT 107* 139*   BMET Recent Labs    04/26/17 0847 04/27/17 1037  NA 136 135  K 4.4 4.7  CL 113* 112*  CO2 14* 11*  GLUCOSE 104* 99  BUN 44* 53*  CREATININE 7.06* 8.39*  CALCIUM 7.4* 7.8*    Studies/Results: No results found.  Medications: I have reviewed the patient's current medications.  Assessment/Plan: A 37 year old gentleman with sickle cell painful crisis and acute on chronic kidney failure  #1  acute on chronic kidney failure: Renal biopsy showed more ATN. Due to worsening renal failure the patient will be initiated on hemodialysis hopefully this will be temporary. Continue follow-up according to Nephrology  #2 sickle cell crisis: Patient is stable with no increased pain. Continue current regimen  #3 scrotal swelling: Secondary to nephrotic syndrome. This appears improving. Patient also has mild ascites.  #4 thrombocytopenia: Appears improving. Continue monitoring.  #5 diarrhea with elevated alkaline phosphatase: Resolved. GI has signed off. No obvious cause.  #6 chronic pain syndrome: Continue pain management as indicated above.  #7 leukocytosis: Secondary to asplenia from sickle cell disease. This should not stop patient from having procedures. No evidence of infection.   LOS: 7 days   GARBA,LAWAL 04/27/2017, 4:53 PM

## 2017-04-27 NOTE — Progress Notes (Signed)
Admit: 04/19/2017 LOS: 7  53M Sickle Cell Anemia and CKD with AoCKD and Nephrotic Syndrome  Subjective:  Came over to [facility] from [facility] On /off mild nausea, denies emesis.   Prelim Renal Biopsy: ATN, glomerulomegaly, mild/mod arteriosclerosis, mod IFTA, no evidence of IC GN. SCr up to 8.4, HCO3 11, K WNL   03/14 0701 - 03/15 0700 In: 420 [P.O.:420] Out: 600 [Urine:600]  Filed Weights   04/25/17 0457 04/26/17 0534 04/26/17 2224  Weight: 26.6 kg (58 lb 9.6 oz) 57.4 kg (126 lb 8.7 oz) 55.1 kg (121 lb 7.6 oz)    Scheduled Meds: . bisacodyl  5 mg Oral Once  . folic acid  1 mg Oral Daily  . senna-docusate  1 tablet Oral BID  . sodium bicarbonate  650 mg Oral TID  . sodium chloride flush  10-40 mL Intracatheter Q12H   Continuous Infusions: . sodium chloride Stopped (04/25/17 2200)   PRN Meds:.HYDROmorphone (DILAUDID) injection, oxyCODONE, sodium chloride flush  Current Labs: reviewed  Results for KANDON, HOSKING (MRN [id number]) as of 04/23/2017 12:46  Ref. Range 04/22/2017 18:47  Protein Creatinine Ratio Latest Ref Range: 0.00 - 0.15 mg/mgCre 30.41 (H)     Ref. Range 04/21/2017 15:17  Protein, 24H Urine Latest Ref Range: 50 - 100 mg/day 15,600 (H)   CT A/P 3/7: Bilateral renal parenchymal atrophy, unchanged from prior exam. There is prominence of the left greater than right renal collecting systems and proximal ureter without calcified stone. Urinary bladder is distended without wall thickening. No calcified urolithiasis.  Results for BLAS, RICHES (MRN [id number]) as of 04/24/2017 14:26  Ref. Range 04/23/2017 14:45  Kappa, lamda light chain ratio Latest Ref Range: 0.26 - 1.65  1.25   Results for PAL, SHELL (MRN [id number]) as of 04/24/2017 14:26  Ref. Range 04/23/2017 14:45  Kappa, lamda light chain ratio Latest Ref Range: 0.26 - 1.65  1.25   Results for SHARIQ, PUIG (MRN [id number]) as of 04/24/2017 14:26  Ref. Range 04/23/2017 14:45  ANA Ab, IFA  Unknown Negative  ds DNA Ab Latest Ref Range: 0 - 9 IU/mL <1   Results for CARROL, BONDAR (MRN [id number]) as of 04/24/2017 14:26  Ref. Range 04/23/2017 14:45  RPR Latest Ref Range: Non Reactive  Non Reactive  Hepatitis B Surface Ag Latest Ref Range: Negative  Negative  HCV Ab Latest Ref Range: 0.0 - 0.9 s/co ratio <0.1  HIV Screen 4th Generation wRfx Latest Ref Range: Non Reactive  Non Reactive   Physical Exam:  Blood pressure 107/64, pulse (!) 112, temperature 98 F (36.7 C), temperature source Oral, resp. rate 18, height 5\' 3"  (1.6 m), weight 55.1 kg (121 lb 7.6 oz), SpO2 97 %. NAD, walking around room RRR  With 2/6 Systolic Murmur CTAB Mildly distended, +BS 1-2+ LEE b/l No rashes/lesions EOMI Nonfocal  A 1. Nonoliguric AoCKD3, new finding of nephrotic syndrome -- prelim is ATN; not uremic; negative serologies, unclear etiology; ? ATN + GN not defined 1. Renal Bx 04/25/16 path prelim: ATN, mild/mod arteriosclerosis, mod IFTA, no IC GN 2. ? Developing some uremia 2. Profound hypoalbuminemia likely related #1 3. Flank / abd pain + bloating, edema in legs and genitals; CT A/P with "colitis";CSY 04/23/17 negative 4. Sickle Cell Anemia 5. Chronic metaboilc acidosis on NaHCO3 related to #1 and #3  P 1. Getting close to HD 2. Will ask IR to help with [facility] placement 3. Tentative will need HD tomorrow if not improved  4. Daily weights, Daily Renal Panel, Strict I/Os, Avoid nephrotoxins (NSAIDs, judicious IV Contrast)  5. Will cont to follow closely    Pearson Grippe MD 04/27/2017, 10:35 AM  Recent Labs  Lab 04/24/17 1200 04/25/17 0436 04/26/17 0847  NA 140 140 136  K 4.4 4.4 4.4  CL 112* 113* 113*  CO2 18* 17* 14*  GLUCOSE 98 95 104*  BUN 34* 37* 44*  CREATININE 5.83* 6.35* 7.06*  CALCIUM 7.3* 7.1* 7.4*  PHOS 6.4* 5.9* 6.1*   Recent Labs  Lab 04/21/17 1019 04/24/17 1200 04/25/17 0437 04/25/17 1520 04/26/17 0847  WBC 16.2* 17.7* 17.1* 16.8* 18.4*  NEUTROABS  8.2* 7.9* 8.2*  --   --   HGB 7.7* 6.0* 7.8* 7.4* 7.4*  HCT 21.8* 16.9* 22.5* 21.5* 21.2*  MCV 97.3 94.9 93.4 92.3 92.6  PLT 127* 107* 128* 118* 107*

## 2017-04-28 LAB — CBC WITH DIFFERENTIAL/PLATELET
BASOS ABS: 0 10*3/uL (ref 0.0–0.1)
BLASTS: 0 %
Band Neutrophils: 0 %
Basophils Relative: 0 %
EOS ABS: 0.4 10*3/uL (ref 0.0–0.7)
Eosinophils Relative: 2 %
HCT: 18.5 % — ABNORMAL LOW (ref 39.0–52.0)
Hemoglobin: 6.3 g/dL — CL (ref 13.0–17.0)
Lymphocytes Relative: 26 %
Lymphs Abs: 5.3 10*3/uL — ABNORMAL HIGH (ref 0.7–4.0)
MCH: 31.5 pg (ref 26.0–34.0)
MCHC: 34.1 g/dL (ref 30.0–36.0)
MCV: 92.5 fL (ref 78.0–100.0)
METAMYELOCYTES PCT: 0 %
MYELOCYTES: 0 %
Monocytes Absolute: 0.8 10*3/uL (ref 0.1–1.0)
Monocytes Relative: 4 %
Neutro Abs: 13.9 10*3/uL — ABNORMAL HIGH (ref 1.7–7.7)
Neutrophils Relative %: 68 %
Other: 0 %
PLATELETS: 164 10*3/uL (ref 150–400)
Promyelocytes Absolute: 0 %
RBC: 2 MIL/uL — AB (ref 4.22–5.81)
RDW: 20.4 % — ABNORMAL HIGH (ref 11.5–15.5)
WBC: 20.4 10*3/uL — AB (ref 4.0–10.5)
nRBC: 0 /100 WBC

## 2017-04-28 LAB — COMPREHENSIVE METABOLIC PANEL
ALBUMIN: 1.1 g/dL — AB (ref 3.5–5.0)
ALT: 24 U/L (ref 17–63)
AST: 46 U/L — AB (ref 15–41)
Alkaline Phosphatase: 167 U/L — ABNORMAL HIGH (ref 38–126)
Anion gap: 9 (ref 5–15)
BUN: 52 mg/dL — AB (ref 6–20)
CHLORIDE: 111 mmol/L (ref 101–111)
CO2: 13 mmol/L — AB (ref 22–32)
CREATININE: 8.31 mg/dL — AB (ref 0.61–1.24)
Calcium: 7.4 mg/dL — ABNORMAL LOW (ref 8.9–10.3)
GFR calc Af Amer: 9 mL/min — ABNORMAL LOW (ref >60)
GFR, EST NON AFRICAN AMERICAN: 7 mL/min — AB (ref >60)
GLUCOSE: 108 mg/dL — AB (ref 65–99)
Potassium: 4.2 mmol/L (ref 3.5–5.1)
Sodium: 133 mmol/L — ABNORMAL LOW (ref 135–145)
Total Bilirubin: 1.6 mg/dL — ABNORMAL HIGH (ref 0.3–1.2)
Total Protein: 4 g/dL — ABNORMAL LOW (ref 6.5–8.1)

## 2017-04-28 LAB — BPAM RBC
BLOOD PRODUCT EXPIRATION DATE: 201904112359
Blood Product Expiration Date: 201904042359
ISSUE DATE / TIME: 201903122131
UNIT TYPE AND RH: 9500
Unit Type and Rh: 5100

## 2017-04-28 LAB — TYPE AND SCREEN
ABO/RH(D): O POS
ANTIBODY SCREEN: NEGATIVE
DONOR AG TYPE: NEGATIVE
Donor AG Type: NEGATIVE
Unit division: 0
Unit division: 0

## 2017-04-28 NOTE — Progress Notes (Signed)
Patient ID: [name] Pick., male   DOB: [birth year], 37 y.o.   MRN: [id number] Subjective:  Patient doing well, Hb dropped to 6.2 today, awaiting decision on HD or no HD. Renal function still remains the same. No symptoms suggestive of uremia, no N/V/D. No headache, no fever. Leg swelling improving per patient.   Objective:  Vital signs in last 24 hours:  Vitals:   04/27/17 2030 04/28/17 0559 04/28/17 1007 04/28/17 1652  BP: 112/68 (!) 116/51 109/64 113/74  Pulse: (!) 116 (!) 110 (!) 108 (!) 101  Resp: 19 20 18 18   Temp: 98.4 F (36.9 C) 98.2 F (36.8 C) 98 F (36.7 C) (!) 97.5 F (36.4 C)  TempSrc: Oral Oral Oral Oral  SpO2: 98% 99% 97% 93%  Weight: 55.3 kg (121 lb 14.6 oz)     Height:       Intake/Output from previous day:  Intake/Output Summary (Last 24 hours) at 04/28/2017 1808 Last data filed at 04/28/2017 1716 Gross per 24 hour  Intake 780 ml  Output 1375 ml  Net -595 ml   Physical Exam: General: Alert, awake, oriented x3, in no acute distress.  HEENT: [place]/AT PEERL, EOMI Neck: Trachea midline,  no masses, no thyromegal,y no JVD, no carotid bruit OROPHARYNX:  Moist, No exudate/ erythema/lesions.  Heart: Regular rate and rhythm, without murmurs, rubs, gallops, PMI non-displaced, no heaves or thrills on palpation.  Lungs: Clear to auscultation, no wheezing or rhonchi noted. No increased vocal fremitus resonant to percussion  Abdomen: Soft, nontender, mildly distended, positive bowel sounds, no masses no hepatosplenomegaly noted. Mildly swollen penis and scrotum, but improved from previously.  Neuro: No focal neurological deficits noted cranial nerves II through XII grossly intact. DTRs 2+ bilaterally upper and lower extremities. Strength 5 out of 5 in bilateral upper and lower extremities. Musculoskeletal: Bilateral lower limb edema Psychiatric: Patient alert and oriented x3, good insight and cognition, good recent to remote recall. Lymph node survey: No cervical  axillary or inguinal lymphadenopathy noted.  Lab Results:  Basic Metabolic Panel:    Component Value Date/Time   NA 133 (L) 04/28/2017 0349   K 4.2 04/28/2017 0349   CL 111 04/28/2017 0349   CO2 13 (L) 04/28/2017 0349   BUN 52 (H) 04/28/2017 0349   CREATININE 8.31 (H) 04/28/2017 0349   CREATININE 1.45 (H) 05/12/2014 1430   GLUCOSE 108 (H) 04/28/2017 0349   CALCIUM 7.4 (L) 04/28/2017 0349   CBC:    Component Value Date/Time   WBC 20.4 (H) 04/28/2017 0349   HGB 6.3 (LL) 04/28/2017 0349   HCT 18.5 (L) 04/28/2017 0349   HCT 23.5 (L) 10/15/2016 0013   PLT 164 04/28/2017 0349   MCV 92.5 04/28/2017 0349   NEUTROABS 13.9 (H) 04/28/2017 0349   LYMPHSABS 5.3 (H) 04/28/2017 0349   MONOABS 0.8 04/28/2017 0349   EOSABS 0.4 04/28/2017 0349   BASOSABS 0.0 04/28/2017 0349    Recent Results (from the past 240 hour(s))  MRSA PCR Screening     Status: None   Collection Time: 04/26/17 10:48 PM  Result Value Ref Range Status   MRSA by PCR NEGATIVE NEGATIVE Final    Comment:        The GeneXpert MRSA Assay (FDA approved for NASAL specimens only), is one component of a comprehensive MRSA colonization surveillance program. It is not intended to diagnose MRSA infection nor to guide or monitor treatment for MRSA infections. Performed at [facility], [address], [place], [place]  [address]     Studies/Results: No results found.  Medications: Scheduled Meds: . bisacodyl  5 mg Oral Once  . folic acid  1 mg Oral Daily  . senna-docusate  1 tablet Oral BID  . sodium bicarbonate  650 mg Oral TID  . sodium chloride flush  10-40 mL Intracatheter Q12H   Continuous Infusions: . sodium chloride Stopped (04/25/17 2200)   PRN Meds:.HYDROmorphone (DILAUDID) injection, oxyCODONE, sodium chloride flush  Consultants:  Nephrology  Procedures:  Renal Biopsy - 3/13  Antibiotics:  Completed  Assessment/Plan: Principal Problem:   Sickle cell pain crisis (HCC) Active  Problems:   CHRONIC KIDNEY DISEASE STAGE II (MILD)   Acute renal failure superimposed on chronic kidney disease (HCC)   Colitis   Nausea vomiting and diarrhea   Other ascites   RUQ pain   Abnormal CT of the abdomen   Immune-complex glomerulonephritis  1. Acute Renal Failure on CKD: Renal function remains the same, patient making good urine today, All serology negative so far. Prelim biopsy showed ATN.atient is stable clinically. Continue current management. Avoid nephrotoxins.  2. Hb SS with Crisis: Hb is down to 6.2 today, will transfuse if Hb drops below 6. Labs in am 3. Scrotal Swelling: Secondary to renal failure and nephrotic syndrome, improving. 4. Thrombocytopenia: Etiology unclear. Likely multifactorial. Likely related to renal failure. 5. Leukocytosis: This is a feature of sickle cell crisis. No evidence of infection. However a review of his records show that he has chronically elevated WBC.  6. Chronic Pain Syndrome: Continue Oxycodone, Continue Oxycodone PRN. Pt unable to receive NSAID's due to AKI.  7. Chronic Metabolic Acidosis: Pt takes Sodium Bicarbonate on a chronic basis. Serum bicarbonate normal. Continue.  Code Status: Full Code Family Communication: N/A Disposition Plan: Not yet ready for discharge  [name] [name]  If 7PM-7AM, please contact night-coverage.  04/28/2017, 6:08 PM  LOS: 8 days

## 2017-04-28 NOTE — Progress Notes (Signed)
Attending physician had not seen patient by 1600. On call sickle cell MD paged. He verbally said we will not give blood today and will reassess patient's hgb in the morning.

## 2017-04-28 NOTE — Progress Notes (Signed)
Admit: 04/19/2017 LOS: 8  45M Sickle Cell Anemia and CKD with AoCKD and Nephrotic Syndrome  Subjective:  SCr plateau from yesterday, HCO3 up some, K ok. Good UOP No N/V, good appeitite, no dysgeusia For [facility] with IR todya   03/15 0701 - 03/16 0700 In: 960 [P.O.:960] Out: 1300 [Urine:1300]  Filed Weights   04/26/17 0534 04/26/17 2224 04/27/17 2030  Weight: 57.4 kg (126 lb 8.7 oz) 55.1 kg (121 lb 7.6 oz) 55.3 kg (121 lb 14.6 oz)    Scheduled Meds: . bisacodyl  5 mg Oral Once  . folic acid  1 mg Oral Daily  . senna-docusate  1 tablet Oral BID  . sodium bicarbonate  650 mg Oral TID  . sodium chloride flush  10-40 mL Intracatheter Q12H   Continuous Infusions: . sodium chloride Stopped (04/25/17 2200)   PRN Meds:.HYDROmorphone (DILAUDID) injection, oxyCODONE, sodium chloride flush  Current Labs: reviewed  Results for LENIS, NETTLETON (MRN [id number]) as of 04/23/2017 12:46  Ref. Range 04/22/2017 18:47  Protein Creatinine Ratio Latest Ref Range: 0.00 - 0.15 mg/mgCre 30.41 (H)     Ref. Range 04/21/2017 15:17  Protein, 24H Urine Latest Ref Range: 50 - 100 mg/day 15,600 (H)   CT A/P 3/7: Bilateral renal parenchymal atrophy, unchanged from prior exam. There is prominence of the left greater than right renal collecting systems and proximal ureter without calcified stone. Urinary bladder is distended without wall thickening. No calcified urolithiasis.  Results for TITAN, KARNER (MRN [id number]) as of 04/24/2017 14:26  Ref. Range 04/23/2017 14:45  Kappa, lamda light chain ratio Latest Ref Range: 0.26 - 1.65  1.25   Results for SALVADOR, COUPE (MRN [id number]) as of 04/24/2017 14:26  Ref. Range 04/23/2017 14:45  Kappa, lamda light chain ratio Latest Ref Range: 0.26 - 1.65  1.25   Results for HOSIE, SHARMAN (MRN [id number]) as of 04/24/2017 14:26  Ref. Range 04/23/2017 14:45  ANA Ab, IFA Unknown Negative  ds DNA Ab Latest Ref Range: 0 - 9 IU/mL <1   Results  for RUFORD, DUDZINSKI (MRN [id number]) as of 04/24/2017 14:26  Ref. Range 04/23/2017 14:45  RPR Latest Ref Range: Non Reactive  Non Reactive  Hepatitis B Surface Ag Latest Ref Range: Negative  Negative  HCV Ab Latest Ref Range: 0.0 - 0.9 s/co ratio <0.1  HIV Screen 4th Generation wRfx Latest Ref Range: Non Reactive  Non Reactive   Physical Exam:  Blood pressure (!) 116/51, pulse (!) 110, temperature 98.2 F (36.8 C), temperature source Oral, resp. rate 20, height 5\' 3"  (1.6 m), weight 55.3 kg (121 lb 14.6 oz), SpO2 99 %. NAD, walking around room RRR  With 2/6 Systolic Murmur CTAB Mildly distended, +BS 1-2+ LEE b/l No rashes/lesions EOMI Nonfocal  A 1. Nonoliguric AoCKD3, new finding of nephrotic syndrome -- prelim is ATN; not uremic; negative serologies, unclear etiology; ? ATN; nephrosis not clearly explained by renal biopsy 1. Renal Bx 04/25/16 path prelim: ATN, mild/mod arteriosclerosis, mod IFTA, no IC GN 2. Today 3/16 with some suggestion of recovery, good UOP, Vol ok. SCr stable.  HOLD ON TDC as I would not do HD today.  Follow closely  2. Profound hypoalbuminemia likely related #1 3. Flank / abd pain + bloating, edema in legs and genitals; CT A/P with "colitis";CSY 04/23/17 negative; improving 4. Sickle Cell Anemia 5. Chronic metaboilc acidosis on NaHCO3 related to #1 and #3  P 1. HOLD on TDC; no HD needed 2.  Monitor labs / UOP for recovery 3. Daily weights, Daily Renal Panel, Strict I/Os, Avoid nephrotoxins (NSAIDs, judicious IV Contrast)  4. Will cont to follow closely    Pearson Grippe MD 04/28/2017, 8:00 AM  Recent Labs  Lab 04/24/17 1200 04/25/17 0436 04/26/17 0847 04/27/17 1037 04/28/17 0349  NA 140 140 136 135 133*  K 4.4 4.4 4.4 4.7 4.2  CL 112* 113* 113* 112* 111  CO2 18* 17* 14* 11* 13*  GLUCOSE 98 95 104* 99 108*  BUN 34* 37* 44* 53* 52*  CREATININE 5.83* 6.35* 7.06* 8.39* 8.31*  CALCIUM 7.3* 7.1* 7.4* 7.8* 7.4*  PHOS 6.4* 5.9* 6.1*  --   --     Recent Labs  Lab 04/25/17 0437  04/26/17 0847 04/27/17 1037 04/28/17 0349  WBC 17.1*   < > 18.4* 19.5* 20.4*  NEUTROABS 8.2*  --   --  11.5* 13.9*  HGB 7.8*   < > 7.4* 7.3* 6.3*  HCT 22.5*   < > 21.2* 21.7* 18.5*  MCV 93.4   < > 92.6 92.3 92.5  PLT 128*   < > 107* 139* 164   < > = values in this interval not displayed.

## 2017-04-28 NOTE — Progress Notes (Signed)
Nutrition Brief Note  Patient identified on the Malnutrition Screening Tool (MST) Report  Wt Readings from Last 15 Encounters:  04/27/17 121 lb 14.6 oz (55.3 kg)  04/07/17 125 lb (56.7 kg)  02/27/17 120 lb (54.4 kg)  12/15/16 117 lb 11.2 oz (53.4 kg)  10/16/16 135 lb 9.6 oz (61.5 kg)  08/01/16 135 lb (61.2 kg)  02/27/16 118 lb 9.7 oz (53.8 kg)  12/03/15 135 lb (61.2 kg)  09/20/15 135 lb (61.2 kg)  03/11/15 129 lb 15.7 oz (59 kg)  11/23/14 127 lb 6.4 oz (57.8 kg)  08/17/14 123 lb (55.8 kg)  05/12/14 129 lb 6.4 oz (58.7 kg)  03/23/14 126 lb 4.8 oz (57.3 kg)  03/19/14 125 lb 14.1 oz (57.1 kg)   Body mass index is 21.6 kg/m. Patient meets criteria for healthy wt for ht based on current BMI.   Per recent intake records, patient is frequently documented as eating 0% of meals.   Today, patient states that he "just cant do that hospital food". RD suggested several different foods. He declined all. He says he does not even want to receive a tray.   RD then inquired as to supplements. He says those "tear my stomach up" and he declines all of them. He says "naw man, im good, im fine jello and icecream". He further reports his mom has been bringing him outside food recently, of which he eats 100%. He believes the 0% of meals documented in chart is because he doesn't eat any of the HOSPITAL food, but he does eat 100% of meals brought from outside.   He says this "kidney thing" has not affected his appetite.   No nutrition interventions warranted at this time. If nutrition issues arise, please consult RD.   Burtis Junes RD, LDN, CNSC Clinical Nutrition Pager: [phone] 04/28/2017 12:58 PM

## 2017-04-29 LAB — CBC WITH DIFFERENTIAL/PLATELET
Basophils Absolute: 0 10*3/uL (ref 0.0–0.1)
Basophils Relative: 0 %
EOS PCT: 3 %
Eosinophils Absolute: 0.5 10*3/uL (ref 0.0–0.7)
HEMATOCRIT: 17 % — AB (ref 39.0–52.0)
Hemoglobin: 5.9 g/dL — CL (ref 13.0–17.0)
LYMPHS PCT: 34 %
Lymphs Abs: 5.6 10*3/uL — ABNORMAL HIGH (ref 0.7–4.0)
MCH: 32.1 pg (ref 26.0–34.0)
MCHC: 34.7 g/dL (ref 30.0–36.0)
MCV: 92.4 fL (ref 78.0–100.0)
Monocytes Absolute: 1.8 10*3/uL — ABNORMAL HIGH (ref 0.1–1.0)
Monocytes Relative: 11 %
NEUTROS PCT: 52 %
Neutro Abs: 8.6 10*3/uL — ABNORMAL HIGH (ref 1.7–7.7)
PLATELETS: 139 10*3/uL — AB (ref 150–400)
RBC: 1.84 MIL/uL — AB (ref 4.22–5.81)
RDW: 20 % — ABNORMAL HIGH (ref 11.5–15.5)
WBC: 16.5 10*3/uL — AB (ref 4.0–10.5)

## 2017-04-29 LAB — COMPREHENSIVE METABOLIC PANEL
ALT: 19 U/L (ref 17–63)
AST: 38 U/L (ref 15–41)
Albumin: 1.1 g/dL — ABNORMAL LOW (ref 3.5–5.0)
Alkaline Phosphatase: 155 U/L — ABNORMAL HIGH (ref 38–126)
Anion gap: 7 (ref 5–15)
BUN: 53 mg/dL — ABNORMAL HIGH (ref 6–20)
CHLORIDE: 114 mmol/L — AB (ref 101–111)
CO2: 15 mmol/L — ABNORMAL LOW (ref 22–32)
Calcium: 7.1 mg/dL — ABNORMAL LOW (ref 8.9–10.3)
Creatinine, Ser: 8.09 mg/dL — ABNORMAL HIGH (ref 0.61–1.24)
GFR calc non Af Amer: 8 mL/min — ABNORMAL LOW (ref >60)
GFR, EST AFRICAN AMERICAN: 9 mL/min — AB (ref >60)
Glucose, Bld: 122 mg/dL — ABNORMAL HIGH (ref 65–99)
POTASSIUM: 3.3 mmol/L — AB (ref 3.5–5.1)
Sodium: 136 mmol/L (ref 135–145)
Total Bilirubin: 1.5 mg/dL — ABNORMAL HIGH (ref 0.3–1.2)
Total Protein: 3.9 g/dL — ABNORMAL LOW (ref 6.5–8.1)

## 2017-04-29 LAB — PREPARE RBC (CROSSMATCH)

## 2017-04-29 MED ORDER — SODIUM CHLORIDE 0.9 % IV SOLN
Freq: Once | INTRAVENOUS | Status: AC
  Administered 2017-04-29: 07:00:00 via INTRAVENOUS

## 2017-04-29 NOTE — Progress Notes (Signed)
Admit: 04/19/2017 LOS: 9  48M Sickle Cell Anemia and CKD with AoCKD and Nephrotic Syndrome  Subjective:  Slight improvement in SCR, HCO3, K 3.3 Hb 5.9 this AM Improved UOP No c/o this AM, hungry, no N/V  03/16 0701 - 03/17 0700 In: 900 [P.O.:900] Out: 2375 [Urine:2375]  Filed Weights   04/27/17 2030 04/28/17 2242 04/29/17 0355  Weight: 55.3 kg (121 lb 14.6 oz) 55.2 kg (121 lb 11.1 oz) 55.2 kg (121 lb 11.1 oz)    Scheduled Meds: . bisacodyl  5 mg Oral Once  . folic acid  1 mg Oral Daily  . senna-docusate  1 tablet Oral BID  . sodium bicarbonate  650 mg Oral TID  . sodium chloride flush  10-40 mL Intracatheter Q12H   Continuous Infusions: . sodium chloride Stopped (04/25/17 2200)  . sodium chloride     PRN Meds:.HYDROmorphone (DILAUDID) injection, oxyCODONE, sodium chloride flush  Current Labs: reviewed  Results for MEKHI, SONN (MRN [id number]) as of 04/23/2017 12:46  Ref. Range 04/22/2017 18:47  Protein Creatinine Ratio Latest Ref Range: 0.00 - 0.15 mg/mgCre 30.41 (H)     Ref. Range 04/21/2017 15:17  Protein, 24H Urine Latest Ref Range: 50 - 100 mg/day 15,600 (H)   CT A/P 3/7: Bilateral renal parenchymal atrophy, unchanged from prior exam. There is prominence of the left greater than right renal collecting systems and proximal ureter without calcified stone. Urinary bladder is distended without wall thickening. No calcified urolithiasis.  Results for PRENTIS, LANGDON (MRN [id number]) as of 04/24/2017 14:26  Ref. Range 04/23/2017 14:45  Kappa, lamda light chain ratio Latest Ref Range: 0.26 - 1.65  1.25   Results for TRONG, GOSLING (MRN [id number]) as of 04/24/2017 14:26  Ref. Range 04/23/2017 14:45  Kappa, lamda light chain ratio Latest Ref Range: 0.26 - 1.65  1.25   Results for KHUSH, PASION (MRN [id number]) as of 04/24/2017 14:26  Ref. Range 04/23/2017 14:45  ANA Ab, IFA Unknown Negative  ds DNA Ab Latest Ref Range: 0 - 9 IU/mL <1    Results for KALAB, CAMPS (MRN [id number]) as of 04/24/2017 14:26  Ref. Range 04/23/2017 14:45  RPR Latest Ref Range: Non Reactive  Non Reactive  Hepatitis B Surface Ag Latest Ref Range: Negative  Negative  HCV Ab Latest Ref Range: 0.0 - 0.9 s/co ratio <0.1  HIV Screen 4th Generation wRfx Latest Ref Range: Non Reactive  Non Reactive   Physical Exam:  Blood pressure 129/76, pulse 87, temperature 98.5 F (36.9 C), temperature source Oral, resp. rate 20, height 5\' 3"  (1.6 m), weight 55.2 kg (121 lb 11.1 oz), SpO2 99 %. NAD, walking around room RRR  With 2/6 Systolic Murmur CTAB Mildly distended, +BS 1-2+ LEE b/l No rashes/lesions EOMI Nonfocal  A 1. Nonoliguric AoCKD3, new finding of nephrotic syndrome -- prelim is ATN; not uremic; negative serologies, unclear etiology; ? ATN; nephrosis not clearly explained by renal biopsy 1. Renal Bx 04/25/16 path prelim: ATN, mild/mod arteriosclerosis, mod IFTA, no IC GN 2. Today 3/17 with further evidence of recovery, good UOP, Vol ok. SCr stable.  Does not need TDC at this poitn 2. Profound hypoalbuminemia likely related #1 3. Flank / abd pain + bloating, edema in legs and genitals; CT A/P with "colitis";CSY 04/23/17 negative; improving 4. Sickle Cell Anemia 5. Chronic metaboilc acidosis on NaHCO3 related to #1 and #3  P 1. HOLD on TDC; no HD needed 2. Cont to Monitor labs / UOP  for ongoing recovery 3. Transfusion per Sickle Cell team 4. Daily weights, Daily Renal Panel, Strict I/Os, Avoid nephrotoxins (NSAIDs, judicious IV Contrast)  5. Will cont to follow closely    Pearson Grippe MD 04/29/2017, 9:05 AM  Recent Labs  Lab 04/24/17 1200 04/25/17 0436 04/26/17 0847 04/27/17 1037 04/28/17 0349 04/29/17 0422  NA 140 140 136 135 133* 136  K 4.4 4.4 4.4 4.7 4.2 3.3*  CL 112* 113* 113* 112* 111 114*  CO2 18* 17* 14* 11* 13* 15*  GLUCOSE 98 95 104* 99 108* 122*  BUN 34* 37* 44* 53* 52* 53*  CREATININE 5.83* 6.35* 7.06* 8.39* 8.31*  8.09*  CALCIUM 7.3* 7.1* 7.4* 7.8* 7.4* 7.1*  PHOS 6.4* 5.9* 6.1*  --   --   --    Recent Labs  Lab 04/27/17 1037 04/28/17 0349 04/29/17 0422  WBC 19.5* 20.4* 16.5*  NEUTROABS 11.5* 13.9* 8.6*  HGB 7.3* 6.3* 5.9*  HCT 21.7* 18.5* 17.0*  MCV 92.3 92.5 92.4  PLT 139* 164 139*

## 2017-04-29 NOTE — Progress Notes (Signed)
Patient ID: [name] [name]., male   DOB: 1980/06/21, 37 y.o.   MRN: [id number] Subjective:  Patient doing much better, he is very pleased that HD is on hold for now, he is making good urine, he has no fever, no chest pain, no SOB. Hb has dropped below 6 today and a un it of PRBC was ordered for transfusion earlier.  There is slight improvement in his serum creatinine level from 8.31 to 8.09, Bicarb slightly improved to 15 from 13. K is down to 3.3 from 4.2  Objective:  Vital signs in last 24 hours:  Vitals:   04/29/17 0943 04/29/17 1049 04/29/17 1100 04/29/17 1315  BP: 114/70 105/69 114/72 111/67  Pulse: (!) 105 72 91 100  Resp: 18 18 17 18   Temp: 98.2 F (36.8 C) 98.2 F (36.8 C) 98.2 F (36.8 C) 98.1 F (36.7 C)  TempSrc: Oral Oral Oral Oral  SpO2: 97% 100%    Weight:      Height:       Intake/Output from previous day:   Intake/Output Summary (Last 24 hours) at 04/29/2017 1514 Last data filed at 04/29/2017 0900 Gross per 24 hour  Intake 780 ml  Output 2025 ml  Net -1245 ml   Physical Exam: General: Alert, awake, oriented x3, in no acute distress.  HEENT: Liberal/AT PEERL, EOMI Neck: Trachea midline,  no masses, no thyromegal,y no JVD, no carotid bruit OROPHARYNX:  Moist, No exudate/ erythema/lesions.  Heart: Regular rate and rhythm, without murmurs, rubs, gallops, PMI non-displaced, no heaves or thrills on palpation.  Lungs: Clear to auscultation, no wheezing or rhonchi noted. No increased vocal fremitus resonant to percussion  Abdomen: Soft, nontender, mildly distended, positive bowel sounds, no masses no hepatosplenomegaly noted. Mild scrotal swelling. Neuro: No focal neurological deficits noted cranial nerves II through XII grossly intact. DTRs 2+ bilaterally upper and lower extremities. Strength 5 out of 5 in bilateral upper and lower extremities. Musculoskeletal: Bilateral lower limb pitting pedal edema.  Psychiatric: Patient alert and oriented x3, good insight and  cognition, good recent to remote recall. Lymph node survey: No cervical axillary or inguinal lymphadenopathy noted.  Lab Results:  Basic Metabolic Panel:    Component Value Date/Time   NA 136 04/29/2017 0422   K 3.3 (L) 04/29/2017 0422   CL 114 (H) 04/29/2017 0422   CO2 15 (L) 04/29/2017 0422   BUN 53 (H) 04/29/2017 0422   CREATININE 8.09 (H) 04/29/2017 0422   CREATININE 1.45 (H) 05/12/2014 1430   GLUCOSE 122 (H) 04/29/2017 0422   CALCIUM 7.1 (L) 04/29/2017 0422   CBC:    Component Value Date/Time   WBC 16.5 (H) 04/29/2017 0422   HGB 5.9 (LL) 04/29/2017 0422   HCT 17.0 (L) 04/29/2017 0422   HCT 23.5 (L) 10/15/2016 0013   PLT 139 (L) 04/29/2017 0422   MCV 92.4 04/29/2017 0422   NEUTROABS 8.6 (H) 04/29/2017 0422   LYMPHSABS 5.6 (H) 04/29/2017 0422   MONOABS 1.8 (H) 04/29/2017 0422   EOSABS 0.5 04/29/2017 0422   BASOSABS 0.0 04/29/2017 0422    Recent Results (from the past 240 hour(s))  MRSA PCR Screening     Status: None   Collection Time: 04/26/17 10:48 PM  Result Value Ref Range Status   MRSA by PCR NEGATIVE NEGATIVE Final    Comment:        The GeneXpert MRSA Assay (FDA approved for NASAL specimens only), is one component of a comprehensive MRSA colonization surveillance program. It is not  intended to diagnose MRSA infection nor to guide or monitor treatment for MRSA infections. Performed at [facility], [address], [place], [place] [address]    Studies/Results: No results found.  Medications: Scheduled Meds: . folic acid  1 mg Oral Daily  . senna-docusate  1 tablet Oral BID  . sodium bicarbonate  650 mg Oral TID  . sodium chloride flush  10-40 mL Intracatheter Q12H   Continuous Infusions: PRN Meds:.HYDROmorphone (DILAUDID) injection, oxyCODONE, sodium chloride flush  Consultants:  Nephrologist  Procedures:  Renal Biopsy 3/13, Prelim showed ATN  Assessment/Plan: Principal Problem:   Sickle cell pain crisis (HCC) Active  Problems:   CHRONIC KIDNEY DISEASE STAGE II (MILD)   AKI (acute kidney injury) (HCC)   Acute renal failure superimposed on chronic kidney disease (HCC)   Colitis   Nausea vomiting and diarrhea   Other ascites   RUQ pain   Abnormal CT of the abdomen   Immune-complex glomerulonephritis  1. Acute Renal Failure on UUV:OZDGU function slightly improved today, patient making good urine today, All serology negative so far. Prelim biopsy showed ATN. Nephrologist recommends to hold [facility] and no need for HD. Patient is stable clinically. Continue current management. Avoid nephrotoxins. Continue to monitor Labs 2. Hb SS with Crisis: Hb is down to 5.9 today, will transfuse one unit of PRBC. Labs in am 3. Scrotal Swelling: Secondary to renal failure and nephrotic syndrome, improving. 4. Thrombocytopenia: Etiology unclear. Likely multifactorial. Likely related to renal failure. 5. Leukocytosis: This is a feature of sickle cell crisis.No evidence of infection.However a review of his records show that he has chronically elevated WBC.  6. Chronic Pain Syndrome: Continue Oxycodone, Continue Oxycodone PRN. Pt unable to receive NSAID's due to AKI.  7. Chronic Metabolic Acidosis: Pt takes Sodium Bicarbonate on a chronic basis. Serum bicarbonate normal. Continue.  Code Status: Full Code Family Communication: N/A Disposition Plan: Not yet ready for discharge  [name] [name]  If 7PM-7AM, please contact night-coverage.  04/29/2017, 3:14 PM  LOS: 9 days

## 2017-04-29 NOTE — Progress Notes (Signed)
CRITICAL VALUE ALERT  Critical Value:  HGB 5.9  Date & Time Notied:  04/29/2017  0600       Second attempt notifying MD at [place]   Provider Notified: Bodenheimer  Orders Received/Actions taken: Received order for Consent, Type and Screen, and 1 unit of PRBC.

## 2017-04-30 LAB — DIFFERENTIAL
BASOS ABS: 0 10*3/uL (ref 0.0–0.1)
Basophils Relative: 0 %
EOS PCT: 2 %
Eosinophils Absolute: 0.4 10*3/uL (ref 0.0–0.7)
LYMPHS ABS: 4.2 10*3/uL — AB (ref 0.7–4.0)
Lymphocytes Relative: 26 %
MONO ABS: 2.1 10*3/uL — AB (ref 0.1–1.0)
Monocytes Relative: 13 %
Neutro Abs: 9.7 10*3/uL — ABNORMAL HIGH (ref 1.7–7.7)
Neutrophils Relative %: 59 %

## 2017-04-30 LAB — TYPE AND SCREEN
ABO/RH(D): O POS
ANTIBODY SCREEN: NEGATIVE
Donor AG Type: NEGATIVE
UNIT DIVISION: 0

## 2017-04-30 LAB — BPAM RBC
BLOOD PRODUCT EXPIRATION DATE: 201904042359
ISSUE DATE / TIME: 201903171038
UNIT TYPE AND RH: 5100

## 2017-04-30 LAB — COMPREHENSIVE METABOLIC PANEL
ALT: 21 U/L (ref 17–63)
AST: 37 U/L (ref 15–41)
Albumin: 1.2 g/dL — ABNORMAL LOW (ref 3.5–5.0)
Alkaline Phosphatase: 157 U/L — ABNORMAL HIGH (ref 38–126)
Anion gap: 8 (ref 5–15)
BUN: 49 mg/dL — ABNORMAL HIGH (ref 6–20)
CHLORIDE: 112 mmol/L — AB (ref 101–111)
CO2: 13 mmol/L — AB (ref 22–32)
Calcium: 6.8 mg/dL — ABNORMAL LOW (ref 8.9–10.3)
Creatinine, Ser: 7.63 mg/dL — ABNORMAL HIGH (ref 0.61–1.24)
GFR, EST AFRICAN AMERICAN: 10 mL/min — AB (ref >60)
GFR, EST NON AFRICAN AMERICAN: 8 mL/min — AB (ref >60)
Glucose, Bld: 128 mg/dL — ABNORMAL HIGH (ref 65–99)
POTASSIUM: 3.1 mmol/L — AB (ref 3.5–5.1)
Sodium: 133 mmol/L — ABNORMAL LOW (ref 135–145)
Total Bilirubin: 1.6 mg/dL — ABNORMAL HIGH (ref 0.3–1.2)
Total Protein: 4 g/dL — ABNORMAL LOW (ref 6.5–8.1)

## 2017-04-30 LAB — CBC
HEMATOCRIT: 23.3 % — AB (ref 39.0–52.0)
HEMOGLOBIN: 7.9 g/dL — AB (ref 13.0–17.0)
MCH: 29.6 pg (ref 26.0–34.0)
MCHC: 33.9 g/dL (ref 30.0–36.0)
MCV: 87.3 fL (ref 78.0–100.0)
Platelets: 153 10*3/uL (ref 150–400)
RBC: 2.67 MIL/uL — ABNORMAL LOW (ref 4.22–5.81)
RDW: 20.9 % — ABNORMAL HIGH (ref 11.5–15.5)
WBC: 16.2 10*3/uL — AB (ref 4.0–10.5)

## 2017-04-30 MED ORDER — ZOLPIDEM TARTRATE 5 MG PO TABS
5.0000 mg | ORAL_TABLET | Freq: Once | ORAL | Status: AC
  Administered 2017-04-30: 5 mg via ORAL
  Filled 2017-04-30: qty 1

## 2017-04-30 MED ORDER — POTASSIUM CHLORIDE 20 MEQ PO PACK: 20.0000 meq | PACK | Freq: Two times a day (BID) | ORAL | Status: DC

## 2017-04-30 MED ORDER — ZOLPIDEM TARTRATE 5 MG PO TABS
5.0000 mg | ORAL_TABLET | Freq: Every evening | ORAL | Status: DC | PRN
  Administered 2017-04-30: 5 mg via ORAL
  Filled 2017-04-30: qty 1

## 2017-04-30 MED ORDER — STERILE WATER FOR INJECTION IV SOLN
INTRAVENOUS | Status: AC
  Administered 2017-04-30: 10:00:00 via INTRAVENOUS
  Filled 2017-04-30 (×2): qty 850

## 2017-04-30 MED ORDER — POTASSIUM CHLORIDE CRYS ER 20 MEQ PO TBCR
20.0000 meq | EXTENDED_RELEASE_TABLET | Freq: Two times a day (BID) | ORAL | Status: AC
  Administered 2017-04-30 – 2017-05-01 (×3): 20 meq via ORAL
  Filled 2017-04-30 (×3): qty 1

## 2017-04-30 NOTE — Progress Notes (Signed)
A 1. Nonoliguric AoCKD3, new finding of nephrotic syndrome -- biopsy 04/25/16 shows ATN, interstitial fibrosis and FSGS! (I spoke to [facility] pathology) 2. Flank / abd pain + bloating, edema; CT A/P with "colitis";CSY 04/23/17 negative; improving 3. Sickle Cell Anemia 4. Chronic metaboilc acidosis on NaHCO3  5. Hypokalemia 6. Dizziness- prob orthostatic due to intravasc vol due to low albumin  P 1. Increase bicarb and give some volume, check orthostatics, K replace 2. I spoke with mother for 15 minutes via telephone   Subjective: Interval History: c/o dizziness Objective: Vital signs in last 24 hours: Temp:  [98.1 F (36.7 C)-98.7 F (37.1 C)] 98.7 F (37.1 C) (03/18 0503) Pulse Rate:  [72-109] 109 (03/18 0503) Resp:  [17-20] 20 (03/18 0503) BP: (105-115)/(67-72) 107/70 (03/18 0503) SpO2:  [96 %-100 %] 96 % (03/18 0503) Weight:  [55.3 kg (121 lb 14.6 oz)] 55.3 kg (121 lb 14.6 oz) (03/18 0207) Weight change: 0.1 kg (3.5 oz)  Intake/Output from previous day: 03/17 0701 - 03/18 0700 In: 602.8 [P.O.:120; I.V.:78.3; Blood:404.5] Out: 2350 [Urine:2350] Intake/Output this shift: Total I/O In: -  Out: 730 [Urine:730]  General appearance: alert and cooperative Resp: clear to auscultation bilaterally and presacral tr to 1+ Cardio: regular rate and rhythm, S1, S2 normal, no murmur, click, rub or gallop Extremities: edema 1-2+  Lab Results: Recent Labs    04/29/17 0422 04/30/17 0113  WBC 16.5* 16.2*  HGB 5.9* 7.9*  HCT 17.0* 23.3*  PLT 139* 153   BMET:  Recent Labs    04/29/17 0422 04/30/17 0120  NA 136 133*  K 3.3* 3.1*  CL 114* 112*  CO2 15* 13*  GLUCOSE 122* 128*  BUN 53* 49*  CREATININE 8.09* 7.63*  CALCIUM 7.1* 6.8*   No results for input(s): PTH in the last 72 hours. [place] Studies: No results for input(s): IRON, TIBC, TRANSFERRIN, FERRITIN in the last 72 hours. Studies/Results: No results found.  Scheduled: . folic acid  1 mg Oral Daily  . senna-docusate  1  tablet Oral BID  . sodium bicarbonate  650 mg Oral TID  . sodium chloride flush  10-40 mL Intracatheter Q12H      LOS: 10 days   Estanislado Emms 04/30/2017,9:11 AM

## 2017-04-30 NOTE — Progress Notes (Signed)
Subjective: Patient is doing much better now.  Renal function continues to improve with creatinine down to 7.6 today.  He has pain down to 5 out of 10.  No fever or chills no nausea vomiting or diarrhea.  Hemodialysis has been held off for now.  Patient has otherwise no other complaints.  Objective: Vital signs in last 24 hours: Temp:  [98.1 F (36.7 C)-98.7 F (37.1 C)] 98.7 F (37.1 C) (03/18 0503) Pulse Rate:  [72-109] 109 (03/18 0503) Resp:  [17-20] 20 (03/18 0503) BP: (105-115)/(67-72) 107/70 (03/18 0503) SpO2:  [96 %-100 %] 96 % (03/18 0503) Weight:  [55.3 kg (121 lb 14.6 oz)] 55.3 kg (121 lb 14.6 oz) (03/18 0207) Weight change: 0.1 kg (3.5 oz) Last BM Date: 04/29/17  Intake/Output from previous day: 03/17 0701 - 03/18 0700 In: 602.8 [P.O.:120; I.V.:78.3; Blood:404.5] Out: 2350 [Urine:2350] Intake/Output this shift: Total I/O In: -  Out: 730 [Urine:730]  General appearance: alert, cooperative and no distress Head: Normocephalic, without obvious abnormality, atraumatic Back: symmetric, no curvature. ROM normal. No CVA tenderness. Resp: clear to auscultation bilaterally Chest wall: no tenderness Cardio: regular rate and rhythm, S1, S2 normal, no murmur, click, rub or gallop GI: soft, non-tender; bowel sounds normal; no masses,  no organomegaly Extremities: extremities normal, atraumatic, no cyanosis or edema Pulses: 2+ and symmetric Skin: Skin color, texture, turgor normal. No rashes or lesions Neurologic: Grossly normal  Lab Results: Recent Labs    04/29/17 0422 04/30/17 0113  WBC 16.5* 16.2*  HGB 5.9* 7.9*  HCT 17.0* 23.3*  PLT 139* 153   BMET Recent Labs    04/29/17 0422 04/30/17 0120  NA 136 133*  K 3.3* 3.1*  CL 114* 112*  CO2 15* 13*  GLUCOSE 122* 128*  BUN 53* 49*  CREATININE 8.09* 7.63*  CALCIUM 7.1* 6.8*    Studies/Results: No results found.  Medications: I have reviewed the patient's current medications.  Assessment/Plan: A  37 year old gentleman with sickle cell painful crisis and acute on chronic kidney failure  #1 acute on chronic kidney failure: Patient is improving.  No hemodialysis planned at this point and close monitoring of renal function will continue.  We will defer decision to nephrology.  #2 sickle cell crisis: Patient is stable with no increased pain. Continue current regimen  #3 hypokalemia: Patient's potassium is low.  We will replete potassium.  Recheck daily  #4 thrombocytopenia: This appears to have resolved.  Continue monitoring  #5 anemia of chronic disease: Secondary to sickle cell disease.  Also advanced renal disease.  Hemoglobin has improved after transfusion.  It is 7.9 today.  Continue to monitor  #6 chronic pain syndrome: Continue pain management as indicated above.  #7 leukocytosis: Secondary to asplenia from sickle cell disease. This should not stop patient from having procedures. No evidence of infection.   LOS: 10 days   GARBA,LAWAL 04/30/2017, 8:41 AM

## 2017-05-01 LAB — BASIC METABOLIC PANEL
Anion gap: 6 (ref 5–15)
BUN: 40 mg/dL — AB (ref 6–20)
CHLORIDE: 111 mmol/L (ref 101–111)
CO2: 17 mmol/L — AB (ref 22–32)
CREATININE: 6.05 mg/dL — AB (ref 0.61–1.24)
Calcium: 6.7 mg/dL — ABNORMAL LOW (ref 8.9–10.3)
GFR calc Af Amer: 13 mL/min — ABNORMAL LOW (ref >60)
GFR calc non Af Amer: 11 mL/min — ABNORMAL LOW (ref >60)
GLUCOSE: 133 mg/dL — AB (ref 65–99)
POTASSIUM: 2.8 mmol/L — AB (ref 3.5–5.1)
Sodium: 134 mmol/L — ABNORMAL LOW (ref 135–145)

## 2017-05-01 MED ORDER — POTASSIUM CHLORIDE CRYS ER 20 MEQ PO TBCR
40.0000 meq | EXTENDED_RELEASE_TABLET | Freq: Two times a day (BID) | ORAL | Status: AC
  Administered 2017-05-01 – 2017-05-02 (×3): 40 meq via ORAL
  Filled 2017-05-01 (×4): qty 2

## 2017-05-01 MED ORDER — ONDANSETRON HCL 4 MG/2ML IJ SOLN
4.0000 mg | Freq: Once | INTRAMUSCULAR | Status: AC
  Administered 2017-05-01: 4 mg via INTRAVENOUS
  Filled 2017-05-01: qty 2

## 2017-05-01 NOTE — Progress Notes (Signed)
Patient called into the room asking to be unhooked from his central line so that he could take a bath. I informed the patient that this RN could not deaccess the central line that I would have to consult IV team to do so. Patient became angry and stated, "Nevermind then, don't even worry about it!" I then left the room. Per [facility], [place], patient called and stated that he needed help. Approximately two minutes later, I entered the patients room to find that he was in the bathroom with the door shut. When I knocked on the door to see if he needed any help, patient replied, "No don't worry about it now, I got it! I guess ya'll see when you get out!" I then asked if I could come in the bathroom and he stated, "NO!" I then left the room. Moments later [place], [place] came to tell this RN that the patient had unhooked the central line himself and then hooked it back up. When I entered the room to assess the patients central line and ask him about what had happened, the patient stated, "Ya'll were taking too long!" I educated the patient that he could not tamper with his central line D/T increased risk of infection. Patient began cursing at this RN and stated, "Ya'll don't ever fucking come in time, but when I don't need ya'll, ya'll always in here!" I apologized to the patient and explained that their were other patients on the floor and that we were trying our best to meet his needs. Patient is unwilling to listen. This RN assessed his central line. Central line clean, dry, and intact. IV fluids appear to be infusing well. Patient stable. Patient informed to call if needed. Ulice Dash, Charge RN informed of this incident.

## 2017-05-01 NOTE — Progress Notes (Signed)
A 1. Nonoliguric AoCKD3, new finding of nephrotic syndrome -- biopsy 04/25/16 shows ATN, interstitial fibrosis and FSGS! (I spoke to [facility] pathology) 2. Flank / abd pain + bloating, edema; CT A/P with "colitis";CSY 04/23/17 negative; improving 3. Sickle Cell Anemia 4. Chronic metaboilc acidosis on NaHCO3  5. Hypokalemia 6. Dizziness- prob orthostatic due to intravasc vol due to low albumin  P Stop IVF, await labs  Subjective: Interval History: feels maybe a little better after IVF  Objective: Vital signs in last 24 hours: Temp:  [98.2 F (36.8 C)-100 F (37.8 C)] 98.4 F (36.9 C) (03/19 0901) Pulse Rate:  [92-100] 100 (03/19 0901) Resp:  [16-19] 16 (03/19 0901) BP: (110-113)/(66-71) 113/71 (03/19 0901) SpO2:  [100 %] 100 % (03/19 0901) Weight:  [55.3 kg (121 lb 14.6 oz)] 55.3 kg (121 lb 14.6 oz) (03/18 2106) Weight change: 0 kg (0 lb)  Intake/Output from previous day: 03/18 0701 - 03/19 0700 In: 2799.5 [P.O.:2202; I.V.:597.5] Out: 8309 [Urine:5630] Intake/Output this shift: Total I/O In: 260 [P.O.:240; I.V.:20] Out: 875 [Urine:875]  General appearance: alert and cooperative Chest wall: no tenderness, presacral edema 1+ Extremities: edema 1-2+  Lab Results: Recent Labs    04/29/17 0422 04/30/17 0113  WBC 16.5* 16.2*  HGB 5.9* 7.9*  HCT 17.0* 23.3*  PLT 139* 153   BMET:  Recent Labs    04/29/17 0422 04/30/17 0120  NA 136 133*  K 3.3* 3.1*  CL 114* 112*  CO2 15* 13*  GLUCOSE 122* 128*  BUN 53* 49*  CREATININE 8.09* 7.63*  CALCIUM 7.1* 6.8*   No results for input(s): PTH in the last 72 hours. [place] Studies: No results for input(s): IRON, TIBC, TRANSFERRIN, FERRITIN in the last 72 hours. Studies/Results: No results found.  Scheduled: . folic acid  1 mg Oral Daily  . senna-docusate  1 tablet Oral BID  . sodium bicarbonate  650 mg Oral TID  . sodium chloride flush  10-40 mL Intracatheter Q12H  Stop IVF  LOS: 11 days   Estanislado Emms 05/01/2017,11:06  AM

## 2017-05-02 ENCOUNTER — Inpatient Hospital Stay (HOSPITAL_COMMUNITY): Payer: Medicare Other

## 2017-05-02 LAB — COMPREHENSIVE METABOLIC PANEL
ALK PHOS: 145 U/L — AB (ref 38–126)
ALT: 22 U/L (ref 17–63)
AST: 40 U/L (ref 15–41)
Anion gap: 7 (ref 5–15)
BUN: 42 mg/dL — AB (ref 6–20)
CO2: 16 mmol/L — ABNORMAL LOW (ref 22–32)
CREATININE: 5.55 mg/dL — AB (ref 0.61–1.24)
Calcium: 6.9 mg/dL — ABNORMAL LOW (ref 8.9–10.3)
Chloride: 113 mmol/L — ABNORMAL HIGH (ref 101–111)
GFR calc Af Amer: 14 mL/min — ABNORMAL LOW (ref >60)
GFR, EST NON AFRICAN AMERICAN: 12 mL/min — AB (ref >60)
GLUCOSE: 138 mg/dL — AB (ref 65–99)
Potassium: 3.4 mmol/L — ABNORMAL LOW (ref 3.5–5.1)
Sodium: 136 mmol/L (ref 135–145)
Total Bilirubin: 1 mg/dL (ref 0.3–1.2)
Total Protein: 3.9 g/dL — ABNORMAL LOW (ref 6.5–8.1)

## 2017-05-02 LAB — CBC WITH DIFFERENTIAL/PLATELET
BASOS ABS: 0 10*3/uL (ref 0.0–0.1)
Basophils Relative: 0 %
EOS PCT: 2 %
Eosinophils Absolute: 0.2 10*3/uL (ref 0.0–0.7)
HEMATOCRIT: 21.7 % — AB (ref 39.0–52.0)
HEMOGLOBIN: 7.5 g/dL — AB (ref 13.0–17.0)
LYMPHS ABS: 3.6 10*3/uL (ref 0.7–4.0)
LYMPHS PCT: 30 %
MCH: 30.4 pg (ref 26.0–34.0)
MCHC: 34.6 g/dL (ref 30.0–36.0)
MCV: 87.9 fL (ref 78.0–100.0)
MONOS PCT: 11 %
Monocytes Absolute: 1.3 10*3/uL — ABNORMAL HIGH (ref 0.1–1.0)
Neutro Abs: 7 10*3/uL (ref 1.7–7.7)
Neutrophils Relative %: 57 %
Platelets: 149 10*3/uL — ABNORMAL LOW (ref 150–400)
RBC: 2.47 MIL/uL — ABNORMAL LOW (ref 4.22–5.81)
RDW: 21.1 % — ABNORMAL HIGH (ref 11.5–15.5)
WBC: 12.1 10*3/uL — AB (ref 4.0–10.5)

## 2017-05-02 NOTE — Progress Notes (Signed)
Subjective: Patient continues to do better.  Creatinine continues to improve.  Patient with some left lower quadrant abdominal pain and lump in that area.  Pain is persistent and reproducible with touch.  Objective: Vital signs in last 24 hours: Temp:  [98 F (36.7 C)-99.3 F (37.4 C)] 99.3 F (37.4 C) (03/20 1700) Pulse Rate:  [92-119] 99 (03/20 1700) Resp:  [16-20] 19 (03/20 1700) BP: (98-113)/(60-73) 107/71 (03/20 1700) SpO2:  [100 %] 100 % (03/20 1700) Weight change:  Last BM Date: 04/30/17  Intake/Output from previous day: 03/19 0701 - 03/20 0700 In: 620 [P.O.:600; I.V.:20] Out: 3000 [Urine:3000] Intake/Output this shift: Total I/O In: 480 [P.O.:480] Out: 2400 [Urine:2400]  General appearance: alert, cooperative and no distress Head: Normocephalic, without obvious abnormality, atraumatic Back: symmetric, no curvature. ROM normal. No CVA tenderness. Resp: clear to auscultation bilaterally Chest wall: no tenderness Cardio: regular rate and rhythm, S1, S2 normal, no murmur, click, rub or gallop GI: soft, non-tender; bowel sounds normal; no masses,  no organomegaly Extremities: extremities normal, atraumatic, no cyanosis or edema Pulses: 2+ and symmetric Skin: Skin color, texture, turgor normal. No rashes or lesions Neurologic: Grossly normal  Lab Results: Recent Labs    04/30/17 0113 05/02/17 0408  WBC 16.2* 12.1*  HGB 7.9* 7.5*  HCT 23.3* 21.7*  PLT 153 149*   BMET Recent Labs    05/01/17 1059 05/02/17 0408  NA 134* 136  K 2.8* 3.4*  CL 111 113*  CO2 17* 16*  GLUCOSE 133* 138*  BUN 40* 42*  CREATININE 6.05* 5.55*  CALCIUM 6.7* 6.9*    Studies/Results: US Abdomen Limited  Result Date: 05/02/2017 CLINICAL DATA:  Left lower quadrant pain.  Sickle cell disease. EXAM: ULTRASOUND ABDOMEN LIMITED COMPARISON:  None. FINDINGS: Specific evaluation of the left lower quadrant was requested. There is no evidence of mass or focal fluid collection in the region.  One could question mild edema the superficial subcutaneous tissues which is nonspecific. IMPRESSION: No left lower quadrant abnormality identified internally. One could question mild nonspecific edema the left lower quadrant subcutaneous fat. Electronically Signed   By: Nelson Chimes M.D.   On: 05/02/2017 09:55    Medications: I have reviewed the patient's current medications.  Assessment/Plan: A 37 year old gentleman with sickle cell painful crisis and acute on chronic kidney failure  #1 acute on chronic kidney failure: Patient continues to do very well.  Creatinine down to 5.  Plan is to possibly discharge patient when creatinine is close to 4 and follow-up as an outpatient.  He is making adequate urine also.  #2 sickle cell crisis: Patient is stable with no increased pain. Continue current regimen  #3 hypokalemia: Patient's potassium is still low.  We will replete potassium.  Recheck daily  #4 thrombocytopenia: This appears to have resolved.  Continue monitoring  #5 anemia of chronic disease: Secondary to sickle cell disease.  Also advanced renal disease.  Hemoglobin has improved after transfusion.  It is 7.5 today.  Continue to monitor  #6 chronic pain syndrome: Continue pain management as indicated above.  #7 leukocytosis: Secondary to asplenia from sickle cell disease. This should not stop patient from having procedures. No evidence of infection.  #8 abdominal pain: Left lower quadrant.  We'll check abdominal ultrasound.  I suspect this may be related to sickle cell crisis.   LOS: 12 days   Katiejo Gilroy,LAWAL 05/02/2017, 12:03 PM

## 2017-05-02 NOTE — Progress Notes (Signed)
Subjective: Patient is doing much better now.  Renal function continues to improve with creatinine down to 7.6 today.  He has pain down to 5 out of 10.  No fever or chills no nausea vomiting or diarrhea.  Hemodialysis has been held off for now.  Patient has otherwise no other complaints.  Objective: Vital signs in last 24 hours: Temp:  [98.4 F (36.9 C)-100 F (37.8 C)] 98.5 F (36.9 C) (03/19 2011) Pulse Rate:  [97-119] 119 (03/19 2011) Resp:  [16] 16 (03/19 2011) BP: (110-113)/(70-73) 113/73 (03/19 2011) SpO2:  [100 %] 100 % (03/19 2011) Weight change:  Last BM Date: 04/30/17  Intake/Output from previous day: 03/19 0701 - 03/20 0700 In: 500 [P.O.:480; I.V.:20] Out: 1850 [Urine:1850] Intake/Output this shift: No intake/output data recorded.  General appearance: alert, cooperative and no distress Head: Normocephalic, without obvious abnormality, atraumatic Back: symmetric, no curvature. ROM normal. No CVA tenderness. Resp: clear to auscultation bilaterally Chest wall: no tenderness Cardio: regular rate and rhythm, S1, S2 normal, no murmur, click, rub or gallop GI: soft, non-tender; bowel sounds normal; no masses,  no organomegaly Extremities: extremities normal, atraumatic, no cyanosis or edema Pulses: 2+ and symmetric Skin: Skin color, texture, turgor normal. No rashes or lesions Neurologic: Grossly normal  Lab Results: Recent Labs    04/29/17 0422 04/30/17 0113  WBC 16.5* 16.2*  HGB 5.9* 7.9*  HCT 17.0* 23.3*  PLT 139* 153   BMET Recent Labs    04/30/17 0120 05/01/17 1059  NA 133* 134*  K 3.1* 2.8*  CL 112* 111  CO2 13* 17*  GLUCOSE 128* 133*  BUN 49* 40*  CREATININE 7.63* 6.05*  CALCIUM 6.8* 6.7*    Studies/Results: No results found.  Medications: I have reviewed the patient's current medications.  Assessment/Plan: A 37 year old gentleman with sickle cell painful crisis and acute on chronic kidney failure  #1 acute on chronic kidney failure:  Patient is improving.  No hemodialysis planned at this point and close monitoring of renal function will continue.  We will defer decision to nephrology.  #2 sickle cell crisis: Patient is stable with no increased pain. Continue current regimen  #3 hypokalemia: Patient's potassium is low.  We will replete potassium.  Recheck daily  #4 thrombocytopenia: This appears to have resolved.  Continue monitoring  #5 anemia of chronic disease: Secondary to sickle cell disease.  Also advanced renal disease.  Hemoglobin has improved after transfusion.  It is 7.9 today.  Continue to monitor  #6 chronic pain syndrome: Continue pain management as indicated above.  #7 leukocytosis: Secondary to asplenia from sickle cell disease. This should not stop patient from having procedures. No evidence of infection.   LOS: 11 days   Chaslyn Eisen,LAWAL 05/01/2017, 12:03 PM

## 2017-05-02 NOTE — Progress Notes (Signed)
Patient was informed to be NPO for procedure of Abdominal US that Dr. Jonelle Sidle ordered. Patient stated he didn't want to be NPO all day. Informed patient that he needed to be NPO for this procedure.

## 2017-05-02 NOTE — Progress Notes (Signed)
A 1. Nonoliguric AoCKD3, and new finding of Nephrotic syndrome --         biopsy 04/25/16 showsATN, interstitial fibrosis and Focal Segmental Glomerulosclerosis(due to SSD).         I think if significant reduction in creat to around 4 on 3/21 he can be managed as OP 2. Flank / abd pain + bloating, edema; CT A/P with "colitis";CSY 04/23/17 negative; improving 3. Sickle Cell Anemia 4. Chronic metaboilc acidosis on NaHCO3 5. Hypokalemia   Subjective: Interval History: c/o left flank dicomfort from renal bx; dizziness resolved  Objective: Vital signs in last 24 hours: Temp:  [98 F (36.7 C)-98.5 F (36.9 C)] 98.1 F (36.7 C) (03/20 0922) Pulse Rate:  [92-119] 92 (03/20 0922) Resp:  [16-20] 20 (03/20 0922) BP: (98-113)/(60-73) 101/64 (03/20 0922) SpO2:  [100 %] 100 % (03/20 0922) Weight change:   Intake/Output from previous day: 03/19 0701 - 03/20 0700 In: 620 [P.O.:600; I.V.:20] Out: 3000 [Urine:3000] Intake/Output this shift: Total I/O In: 240 [P.O.:240] Out: 1200 [Urine:1200]  General appearance: alert and cooperative Chest wall: no tenderness, left flank no ecchymosis Extremities: 1-2 edema  Lab Results: Recent Labs    04/30/17 0113 05/02/17 0408  WBC 16.2* 12.1*  HGB 7.9* 7.5*  HCT 23.3* 21.7*  PLT 153 149*   BMET:  Recent Labs    05/01/17 1059 05/02/17 0408  NA 134* 136  K 2.8* 3.4*  CL 111 113*  CO2 17* 16*  GLUCOSE 133* 138*  BUN 40* 42*  CREATININE 6.05* 5.55*  CALCIUM 6.7* 6.9*   No results for input(s): PTH in the last 72 hours. [place] Studies: No results for input(s): IRON, TIBC, TRANSFERRIN, FERRITIN in the last 72 hours. Studies/Results: US Abdomen Limited  Result Date: 05/02/2017 CLINICAL DATA:  Left lower quadrant pain.  Sickle cell disease. EXAM: ULTRASOUND ABDOMEN LIMITED COMPARISON:  None. FINDINGS: Specific evaluation of the left lower quadrant was requested. There is no evidence of mass or focal fluid collection in the region. One  could question mild edema the superficial subcutaneous tissues which is nonspecific. IMPRESSION: No left lower quadrant abnormality identified internally. One could question mild nonspecific edema the left lower quadrant subcutaneous fat. Electronically Signed   By: Nelson Chimes M.D.   On: 05/02/2017 09:55    Scheduled: . folic acid  1 mg Oral Daily  . potassium chloride  40 mEq Oral BID  . senna-docusate  1 tablet Oral BID  . sodium bicarbonate  650 mg Oral TID  . sodium chloride flush  10-40 mL Intracatheter Q12H    LOS: 12 days   Estanislado Emms, MD 05/02/2017,1:11 PM

## 2017-05-03 DIAGNOSIS — N058 Unspecified nephritic syndrome with other morphologic changes: Secondary | ICD-10-CM

## 2017-05-03 DIAGNOSIS — R1032 Left lower quadrant pain: Secondary | ICD-10-CM

## 2017-05-03 DIAGNOSIS — N182 Chronic kidney disease, stage 2 (mild): Secondary | ICD-10-CM

## 2017-05-03 DIAGNOSIS — N049 Nephrotic syndrome with unspecified morphologic changes: Secondary | ICD-10-CM

## 2017-05-03 LAB — BASIC METABOLIC PANEL
Anion gap: 6 (ref 5–15)
BUN: 37 mg/dL — AB (ref 6–20)
CALCIUM: 7.2 mg/dL — AB (ref 8.9–10.3)
CO2: 15 mmol/L — ABNORMAL LOW (ref 22–32)
Chloride: 114 mmol/L — ABNORMAL HIGH (ref 101–111)
Creatinine, Ser: 4.65 mg/dL — ABNORMAL HIGH (ref 0.61–1.24)
GFR calc non Af Amer: 15 mL/min — ABNORMAL LOW (ref >60)
GFR, EST AFRICAN AMERICAN: 17 mL/min — AB (ref >60)
Glucose, Bld: 98 mg/dL (ref 65–99)
Potassium: 4.2 mmol/L (ref 3.5–5.1)
SODIUM: 135 mmol/L (ref 135–145)

## 2017-05-03 MED ORDER — POTASSIUM CHLORIDE CRYS ER 20 MEQ PO TBCR: 40.0000 meq | EXTENDED_RELEASE_TABLET | Freq: Three times a day (TID) | ORAL | Status: DC

## 2017-05-03 MED ORDER — OXYCODONE HCL 10 MG PO TABS: 10.0000 mg | ORAL_TABLET | Freq: Four times a day (QID) | ORAL | 0 refills | Status: DC | PRN

## 2017-05-03 NOTE — Progress Notes (Signed)
Patient discharged to home with mom. All instructions reviewed. Patient has followup appt set up by Dr. Doreene Burke. Central removed by IV team. All belongings with patient. Patient left unit in stable condition.  Sheliah Plane RN

## 2017-05-03 NOTE — Progress Notes (Signed)
Assessment:  Nonoliguric AoCKD3 and new finding of Nephrotic syndrome --         biopsy 04/25/16 showsATN, interstitial fibrosis and Focal Segmental Glomerulosclerosis(due to SSD).         I think if significant reduction in creat to around 4 on 3/21 he can be managed as OP 2. Chronic metabolic acidosis on NaHCO3 3. Hypokalemia, improved  Subjective: Interval History: c/o L LQ discomfort  Objective: Vital signs in last 24 hours: Temp:  [98.4 F (36.9 C)-99.3 F (37.4 C)] 98.4 F (36.9 C) (03/21 1110) Pulse Rate:  [99-106] 101 (03/21 1110) Resp:  [18-20] 18 (03/21 1110) BP: (94-107)/(55-71) 94/55 (03/21 1110) SpO2:  [100 %] 100 % (03/21 1110) Weight:  [55.2 kg (121 lb 11.1 oz)] 55.2 kg (121 lb 11.1 oz) (03/20 2110) Weight change:   Intake/Output from previous day: 03/20 0701 - 03/21 0700 In: 780 [P.O.:780] Out: 4000 [Urine:4000] Intake/Output this shift: Total I/O In: 537 [P.O.:537] Out: 1400 [Urine:1400]  General appearance: alert and cooperative GI: soft, non-tender; bowel sounds normal; no masses,  no organomegaly and mild LLQ tenderness Extremities: edema 1+  Lab Results: Recent Labs    05/02/17 0408  WBC 12.1*  HGB 7.5*  HCT 21.7*  PLT 149*   BMET:  Recent Labs    05/02/17 0408 05/03/17 0412  NA 136 135  K 3.4* 4.2  CL 113* 114*  CO2 16* 15*  GLUCOSE 138* 98  BUN 42* 37*  CREATININE 5.55* 4.65*  CALCIUM 6.9* 7.2*   No results for input(s): PTH in the last 72 hours. Iron Studies: No results for input(s): IRON, TIBC, TRANSFERRIN, FERRITIN in the last 72 hours. Studies/Results: US Abdomen Limited  Result Date: 05/02/2017 CLINICAL DATA:  Left lower quadrant pain.  Sickle cell disease. EXAM: ULTRASOUND ABDOMEN LIMITED COMPARISON:  None. FINDINGS: Specific evaluation of the left lower quadrant was requested. There is no evidence of mass or focal fluid collection in the region. One could question mild edema the superficial subcutaneous tissues which is  nonspecific. IMPRESSION: No left lower quadrant abnormality identified internally. One could question mild nonspecific edema the left lower quadrant subcutaneous fat. Electronically Signed   By: Nelson Chimes M.D.   On: 05/02/2017 09:55    Scheduled: . folic acid  1 mg Oral Daily  . senna-docusate  1 tablet Oral BID  . sodium bicarbonate  650 mg Oral TID  . sodium chloride flush  10-40 mL Intracatheter Q12H     LOS: 13 days   [name] [name] 05/03/2017,1:03 PM

## 2017-05-03 NOTE — Discharge Summary (Signed)
Physician Discharge Summary  [name] [name]. EYC:[id number] DOB: 17-Oct-1980 DOA: 04/19/2017  PCP: [name], MD  Admit date: 04/19/2017  Discharge date: 05/03/2017  Discharge Diagnoses:  Principal Problem:   Sickle cell pain crisis ([place]) Active Problems:   CHRONIC KIDNEY DISEASE STAGE II (MILD)   AKI (acute kidney injury) ([place])   Acute renal failure superimposed on chronic kidney disease (HCC)   Colitis   Nausea vomiting and diarrhea   Other ascites   RUQ pain   Abnormal CT of the abdomen   Immune-complex glomerulonephritis   Discharge Condition: Stable  Disposition:  Follow-up Information    [name], MD. Call.   Specialty:  Family Medicine Contact information: [place] [address] [phone]          Pt is discharged home in good condition and is to follow up with [name], MD this week to have labs evaluated. He is instructed to increase activity slowly and balance with rest for the next few days, and use prescribed medication to complete treatment of pain  Diet: Regular  Wt Readings from Last 3 Encounters:  05/02/17 121 lb 11.1 oz (55.2 kg)  04/07/17 125 lb (56.7 kg)  02/27/17 120 lb (54.4 kg)    History of present illness:  [name] [name]. is a 37 y.o. male with history of sickle cell anemia with chronic kidney disease secondary to sickle cell disease presents to the ER with complaints of persistent nausea vomiting diarrhea and back pain and abdominal pain.  Patient was admitted to the hospital 3 weeks ago with similar complaints.  Patient stated since discharge patient had persistent vomiting and diarrhea which is acutely worsened over the last 24 hours.  Patient also noted some blood in the stools.  Over the last 2 weeks patient's pain is mostly in the back which has moved to the friend last 24 hours.  Vomitus does not have any blood.  Stool has been noticed to have some blood.  Denies any fever chills.   Patient also had some chest pain on vomiting.  Denies any shortness of breath.  ED Course: The ER patient blood pressure is in the low normal.  Creatinine is mildly worsened from baseline. Hemoglobin was around 5.5 and patient's usually is around 6-7.  CT of the abdomen shows worsening of the colitis now involving the descending colon along with ascending and transverse colon and also some changes seen in the duodenum.  Patient was given 1 unit of PRBC in the ER.  Fluids and pain medications and admitted for further management  Hospital Course:  Patient was admitted for sickle cell pain crisis and acute colitis. He was managed with appropriate sickle cell pain management protocol individualized for [place], he did not receive Toradol because of CKD (Contraindicated). Patient developed worsened renal function on admission, with serum creatinine up to about 8.39. Nephrologist was consulted, kidney biopsy performed, showed ATN, initially was considered for short term dialysis but his kidney started to recover, and serum creatinine slowly improved to about 4 before discharge. He also received PRBC transfusion for low Hb during this admission. He slowly improved in all his symptoms, edema did not completely resolved but improved. He was ambulating without restriction. Of note, patient is non-adherent to diet prescription, he would order fried food and fast food with significant salt content despite ordered renal diet. He claims he does not do well with any of these "specialized diet" and that despite his choices, his  kidney function is improving anyway. Patient slowly improved, remained hemodynamically stable, Nephrologist signed off and scheduled out patient follow up. He was discharged home in a hemodynamically stable condition to follow up with PCP in 3 days for lab recheck and follow up. He will also follow up with Nephrologist and Hematologist. Avoid NSAIDs and hold Vasotec for now until  reviewed.  Discharge Exam: Vitals:   05/03/17 0541 05/03/17 1110  BP: (!) 100/56 (!) 94/55  Pulse: (!) 106 (!) 101  Resp: 19 18  Temp: 98.4 F (36.9 C) 98.4 F (36.9 C)  SpO2: 100% 100%   Vitals:   05/02/17 1700 05/02/17 2110 05/03/17 0541 05/03/17 1110  BP: 107/71 105/66 (!) 100/56 (!) 94/55  Pulse: 99 (!) 103 (!) 106 (!) 101  Resp: 19 20 19 18   Temp: 99.3 F (37.4 C) 98.9 F (37.2 C) 98.4 F (36.9 C) 98.4 F (36.9 C)  TempSrc: Oral Oral Oral Oral  SpO2: 100% 100% 100% 100%  Weight:  121 lb 11.1 oz (55.2 kg)    Height:       General appearance : Awake, alert, not in any distress. Speech Clear. Not toxic looking HEENT: Atraumatic and Normocephalic, pupils equally reactive to light and accomodation Neck: Supple, no JVD. No cervical lymphadenopathy.  Chest: Good air entry bilaterally, no added sounds  CVS: S1 S2 regular, no murmurs.  Abdomen: Bowel sounds present, Non tender and not distended with no gaurding, rigidity or rebound. Extremities: B/L Lower Ext shows no edema, both legs are warm to touch Neurology: Awake alert, and oriented X 3, CN II-XII intact, Non focal Skin: No Rash  Discharge Instructions  Discharge Instructions    Diet - low sodium heart healthy   Complete by:  As directed    Increase activity slowly   Complete by:  As directed      Allergies as of 05/03/2017      Reactions   Nsaids    Morphine And Related Other (See Comments)   "real bad headaches"      Medication List    TAKE these medications   enalapril 5 MG tablet Commonly known as:  VASOTEC Take 5 mg by mouth daily.   folic acid 1 MG tablet Commonly known as:  FOLVITE Take 1 tablet (1 mg total) daily by mouth.   MUSCLE RUB 10-15 % Crea Apply 1 application as needed topically for muscle pain.   nortriptyline 25 MG capsule Commonly known as:  PAMELOR TAKE 1 CAPSULE (25 MG TOTAL) BY MOUTH AT BEDTIME.   Oxycodone HCl 10 MG Tabs Take 1 tablet (10 mg total) by mouth every 6  (six) hours as needed.   sodium bicarbonate 650 MG tablet Take 1 tablet (650 mg total) 3 (three) times daily by mouth.       The results of significant diagnostics from this hospitalization (including imaging, microbiology, ancillary and laboratory) are listed below for reference.    Significant Diagnostic Studies: US Abdomen Complete  Result Date: 04/25/2017 CLINICAL DATA:  Abdominal pain with ascites. EXAM: ABDOMEN ULTRASOUND COMPLETE DUPLEX LIVER ULTRASOUND TECHNIQUE: Grayscale and color ultrasound of the abdomen was performed. Color and duplex Doppler ultrasound was also performed to evaluate the hepatic in-flow and out-flow vessels. COMPARISON:  CT abdomen pelvis dated April 19, 2017. FINDINGS: Gallbladder: Surgically absent. Common bile duct: Diameter: 3 mm, normal Liver: No focal lesion identified. Increased in parenchymal echogenicity. Portal vein is patent on color Doppler imaging with normal direction of blood flow towards the liver. IVC:  No abnormality visualized. Pancreas: Visualized portion unremarkable. Spleen: Surgically absent. Right Kidney: Length: 10.4 cm. Echogenicity within normal limits. No mass or hydronephrosis visualized. Left Kidney: Length: 9.2 cm. Echogenicity within normal limits. No mass or hydronephrosis visualized. Abdominal aorta: No aneurysm visualized. Other findings: None. Portal Vein Velocities Main: 58.2 cm/sec, hepatopetal Right: 16.4 cm/sec, hepatopetal Left: 25.5 cm/sec, hepatopetal Hepatic Vein Velocities Right: 26.5 cm/sec, hepatofugal Middle: 52.3 cm/sec, hepatofugal Left: 36.5 cm/sec, hepatofugal Hepatic Artery Velocity: 50.4 cm/sec Varices: Absent. Ascites: Trace perihepatic ascites. IMPRESSION: 1. Increased hepatic parenchymal echogenicity as can be seen with liver disease. No focal abnormality. Trace perihepatic ascites. 2. Normal velocities and direction of flow within the hepatic vessels. Electronically Signed   By: Titus Dubin M.D.   On: 04/25/2017  14:20   US Abdomen Limited  Result Date: 05/02/2017 CLINICAL DATA:  Left lower quadrant pain.  Sickle cell disease. EXAM: ULTRASOUND ABDOMEN LIMITED COMPARISON:  None. FINDINGS: Specific evaluation of the left lower quadrant was requested. There is no evidence of mass or focal fluid collection in the region. One could question mild edema the superficial subcutaneous tissues which is nonspecific. IMPRESSION: No left lower quadrant abnormality identified internally. One could question mild nonspecific edema the left lower quadrant subcutaneous fat. Electronically Signed   By: Nelson Chimes M.D.   On: 05/02/2017 09:55   US Abdomen Limited  Result Date: 04/23/2017 CLINICAL DATA:  Ascites seen on CT abdomen and pelvis 04/19/2017. Evaluate for paracentesis. EXAM: LIMITED ABDOMEN ULTRASOUND FOR ASCITES TECHNIQUE: Limited ultrasound survey for ascites was performed in all four abdominal quadrants. COMPARISON:  None. FINDINGS: Very little ascitic fluid is identified. The patient is not a candidate for paracentesis. IMPRESSION: As above. Electronically Signed   By: Inge Rise M.D.   On: 04/23/2017 16:05   Dg Chest Port 1 View  Result Date: 04/24/2017 CLINICAL DATA:  Central catheter placement EXAM: PORTABLE CHEST 1 VIEW COMPARISON:  April 07, 2017 FINDINGS: Central catheter tip is in the superior vena cava near the cavoatrial junction. No pneumothorax. There is patchy bibasilar lung atelectatic change. Lungs elsewhere are clear. Heart size and pulmonary vascularity are normal. No adenopathy. There is avascular necrosis in the right humeral head. IMPRESSION: 1. Central catheter tip in superior vena cava near cavoatrial junction. No pneumothorax. 2. Bibasilar atelectasis, slightly more on the left than the right. Earliest changes of pneumonia cannot be excluded. 3.  Avascular necrosis right humeral head. Electronically Signed   By: Lowella Grip III M.D.   On: 04/24/2017 11:54   Dg Chest Port 1  View  Result Date: 04/07/2017 CLINICAL DATA:  Sickle cell pain crisis.  Right flank pain. EXAM: PORTABLE CHEST 1 VIEW COMPARISON:  02/27/2017 FINDINGS: The heart is normal in size allowing for AP technique and low lung volumes. Lungs are under aerated with bibasilar atelectasis. No pneumothorax or pleural effusion. There are sclerotic changes and articular surface collapse involving the right humeral head consistent with avascular necrosis and bone infarct. IMPRESSION: Bibasilar atelectasis. Bony changes related to sickle cell disorder and bone infarct. Electronically Signed   By: Marybelle Killings M.D.   On: 04/07/2017 14:03   Ct Renal Stone Study  Result Date: 04/19/2017 CLINICAL DATA:  Flank pain. Stone disease suspected. Diarrhea and abdominal discomfort. Recent hospital admission for colitis. No improvement, now with weakness. EXAM: CT ABDOMEN AND PELVIS WITHOUT CONTRAST TECHNIQUE: Multidetector CT imaging of the abdomen and pelvis was performed following the standard protocol without IV contrast. COMPARISON:  CT 04/07/2017 FINDINGS: Lower chest:  Bibasilar scarring with slight improved lung aeration from prior exam. Trace bilateral pleural thickening. Decreased density of the blood pool consistent with anemia. Hepatobiliary: No focal hepatic lesion. Clips in the gallbladder fossa postcholecystectomy. No biliary dilatation. Pancreas: No ductal dilatation. No definite peripancreatic fat stranding, however mild generalized edema of the upper mesentery. Spleen: Tiny calcified spleen, sequela of sickle cell, unchanged. Adrenals/Urinary Tract: Normal adrenal glands. Bilateral renal parenchymal atrophy, unchanged from prior exam. There is prominence of the left greater than right renal collecting systems and proximal ureter without calcified stone. Urinary bladder is distended without wall thickening. No calcified urolithiasis. Stomach/Bowel: Persistent and slight worsening colonic wall thickening of the ascending and  transverse colon, now with involvement of the mid and proximal descending colon. No evidence of perforation. Possible wall thickening of the duodenum, suboptimally assessed without enteric contrast and presence of intra-abdominal ascites. No other small bowel inflammation. Normal appendix. Vascular/Lymphatic: Innumerable central mesenteric nodes that are not enlarged by size criteria. Decreased blood pool density consistent with anemia. Reproductive: Prostate is unremarkable. Other: Increased abdominopelvic ascites from prior, small to moderate. Increased mesenteric edema. No free air. No evidence of intra-abdominal abscess. Musculoskeletal: Diffuse osteosclerosis consistent with history of sickle cell. Multiple small endplate depressions throughout spine. No acute finding. IMPRESSION: 1. Persistent/worsening colitis with colonic wall thickening of the ascending and transverse colon, new involvement of the descending colon. Possible new wall thickening of the duodenum. Findings may be infectious or inflammatory. 2. Increased abdominopelvic ascites. 3. Prominence of bilateral renal collecting systems without urolithiasis. This may be secondary to urinary bladder distention. Electronically Signed   By: Jeb Levering M.D.   On: 04/19/2017 23:43   Ct Renal Stone Study  Result Date: 04/07/2017 CLINICAL DATA:  Right-sided flank pain beginning today. Sickle cell disease. EXAM: CT ABDOMEN AND PELVIS WITHOUT CONTRAST TECHNIQUE: Multidetector CT imaging of the abdomen and pelvis was performed following the standard protocol without IV contrast. COMPARISON:  None. FINDINGS: Lower chest: Bibasilar scarring. Asymmetric opacity in left lung base may be due to atelectasis or infiltrate. Hepatobiliary: No mass visualized on this unenhanced exam. Prior cholecystectomy. No evidence of biliary obstruction. Pancreas: No mass or inflammatory process visualized on this unenhanced exam. Spleen: Tiny calcified spleen, consistent  with auto splenectomy of sickle cell disease. Adrenals/Urinary tract: Bilateral renal parenchymal atrophy. No evidence of urolithiasis or hydronephrosis. Unremarkable unopacified urinary bladder. Stomach/Bowel: Mild to moderate wall thickening is seen involving the ascending and transverse colon, consistent with colitis. No evidence of bowel obstruction or abscess. Mild ascites noted. Vascular/Lymphatic: No pathologically enlarged lymph nodes identified. No evidence of abdominal aortic aneurysm. Reproductive:  No mass or other significant abnormality. Other:  None. Musculoskeletal: No suspicious bone lesions identified. Diffuse osteosclerosis, consistent with sickle cell disease. IMPRESSION: Colitis involving the ascending and transverse colon.  Mild ascites. No evidence of urolithiasis or hydronephrosis. Mild atelectasis or scarring in left lung base. Diffuse osteosclerosis and auto-splenectomy, consistent with chronic sickle cell disease. Electronically Signed   By: Earle Gell M.D.   On: 04/07/2017 15:56   US Biopsy (kidney)  Result Date: 04/25/2017 CLINICAL DATA:  Six anemia, acute on chronic kidney disease and nephrotic syndrome. Renal biopsy requested. EXAM: ULTRASOUND GUIDED CORE BIOPSY OF LEFT KIDNEY MEDICATIONS: 2.0 mg IV Versed; 150 mcg IV Fentanyl Total Moderate Sedation Time: 16 minutes. The patient's level of consciousness and physiologic status were continuously monitored during the procedure by Radiology nursing. PROCEDURE: The procedure, risks, benefits, and alternatives were explained to the patient. Questions regarding the  procedure were encouraged and answered. The patient understands and consents to the procedure. A time out was performed prior to initiating the procedure. Both kidneys were examined by ultrasound. The left flank region was prepped with chlorhexidine in a sterile fashion, and a sterile drape was applied covering the operative field. A sterile gown and sterile gloves were used  for the procedure. Local anesthesia was provided with 1% Lidocaine. Samples of the left lower pole renal cortex were obtained with a 16 gauge core device. Three passes with the core biopsy device were performed and samples sent in saline. COMPLICATIONS: None. FINDINGS: Both kidneys are well visualized by ultrasound. The left kidney was chosen as it is slightly more superficial in location from a posterior approach and also demonstrated slightly thicker renal cortex compared to the right. The first core biopsy sample was short in length. Two additional samples were therefore obtained in order to have a sufficient amount of tissue for pathologic analysis. IMPRESSION: Ultrasound-guided core biopsy performed at the level of left lower pole renal cortex. Electronically Signed   By: Aletta Edouard M.D.   On: 04/25/2017 13:18   US Abdominal Pelvic Art/vent Flow Doppler  Result Date: 04/25/2017 CLINICAL DATA:  Abdominal pain with ascites. EXAM: ABDOMEN ULTRASOUND COMPLETE DUPLEX LIVER ULTRASOUND TECHNIQUE: Grayscale and color ultrasound of the abdomen was performed. Color and duplex Doppler ultrasound was also performed to evaluate the hepatic in-flow and out-flow vessels. COMPARISON:  CT abdomen pelvis dated April 19, 2017. FINDINGS: Gallbladder: Surgically absent. Common bile duct: Diameter: 3 mm, normal Liver: No focal lesion identified. Increased in parenchymal echogenicity. Portal vein is patent on color Doppler imaging with normal direction of blood flow towards the liver. IVC: No abnormality visualized. Pancreas: Visualized portion unremarkable. Spleen: Surgically absent. Right Kidney: Length: 10.4 cm. Echogenicity within normal limits. No mass or hydronephrosis visualized. Left Kidney: Length: 9.2 cm. Echogenicity within normal limits. No mass or hydronephrosis visualized. Abdominal aorta: No aneurysm visualized. Other findings: None. Portal Vein Velocities Main: 58.2 cm/sec, hepatopetal Right: 16.4 cm/sec,  hepatopetal Left: 25.5 cm/sec, hepatopetal Hepatic Vein Velocities Right: 26.5 cm/sec, hepatofugal Middle: 52.3 cm/sec, hepatofugal Left: 36.5 cm/sec, hepatofugal Hepatic Artery Velocity: 50.4 cm/sec Varices: Absent. Ascites: Trace perihepatic ascites. IMPRESSION: 1. Increased hepatic parenchymal echogenicity as can be seen with liver disease. No focal abnormality. Trace perihepatic ascites. 2. Normal velocities and direction of flow within the hepatic vessels. Electronically Signed   By: Titus Dubin M.D.   On: 04/25/2017 14:20    Microbiology: Recent Results (from the past 240 hour(s))  MRSA PCR Screening     Status: None   Collection Time: 04/26/17 10:48 PM  Result Value Ref Range Status   MRSA by PCR NEGATIVE NEGATIVE Final    Comment:        The GeneXpert MRSA Assay (FDA approved for NASAL specimens only), is one component of a comprehensive MRSA colonization surveillance program. It is not intended to diagnose MRSA infection nor to guide or monitor treatment for MRSA infections. Performed at [facility], [place] [address], [place], [place] [address]      Labs: Basic Metabolic Panel: Recent Labs  Lab 04/29/17 0422 04/30/17 0120 05/01/17 1059 05/02/17 0408 05/03/17 0412  NA 136 133* 134* 136 135  K 3.3* 3.1* 2.8* 3.4* 4.2  CL 114* 112* 111 113* 114*  CO2 15* 13* 17* 16* 15*  GLUCOSE 122* 128* 133* 138* 98  BUN 53* 49* 40* 42* 37*  CREATININE 8.09* 7.63* 6.05* 5.55* 4.65*  CALCIUM 7.1* 6.8* 6.7* 6.9* 7.2*   Liver Function Tests: Recent Labs  Lab 04/27/17 1037 04/28/17 0349 04/29/17 0422 04/30/17 0120 05/02/17 0408  AST 55* 46* 38 37 40  ALT 27 24 19 21 22   ALKPHOS 177* 167* 155* 157* 145*  BILITOT 1.8* 1.6* 1.5* 1.6* 1.0  PROT 4.3* 4.0* 3.9* 4.0* 3.9*  ALBUMIN 1.2* 1.1* 1.1* 1.2* <1.0*   No results for input(s): LIPASE, AMYLASE in the last 168 hours. No results for input(s): AMMONIA in the last 168 hours. CBC: Recent Labs  Lab 04/27/17 1037  04/28/17 0349 04/29/17 0422 04/30/17 0113 04/30/17 0200 05/02/17 0408  WBC 19.5* 20.4* 16.5* 16.2*  --  12.1*  NEUTROABS 11.5* 13.9* 8.6*  --  9.7* 7.0  HGB 7.3* 6.3* 5.9* 7.9*  --  7.5*  HCT 21.7* 18.5* 17.0* 23.3*  --  21.7*  MCV 92.3 92.5 92.4 87.3  --  87.9  PLT 139* 164 139* 153  --  149*   Cardiac Enzymes: No results for input(s): CKTOTAL, CKMB, CKMBINDEX, TROPONINI in the last 168 hours. BNP: Invalid input(s): POCBNP CBG: No results for input(s): GLUCAP in the last 168 hours.  Time coordinating discharge: 50 minutes  Signed:  [facility] 05/03/2017, 12:58 PM

## 2017-05-04 ENCOUNTER — Ambulatory Visit: Payer: Medicare Other | Admitting: Family Medicine

## 2017-05-08 ENCOUNTER — Encounter (HOSPITAL_COMMUNITY): Payer: Self-pay | Admitting: Nephrology
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
## 2017-05-11 ENCOUNTER — Encounter: Payer: Self-pay | Admitting: Family Medicine

## 2017-05-11 ENCOUNTER — Ambulatory Visit (INDEPENDENT_AMBULATORY_CARE_PROVIDER_SITE_OTHER): Payer: Medicare Other | Admitting: Family Medicine

## 2017-05-11 VITALS — BP 111/66 | HR 122 | Temp 98.8°F | Resp 16 | Ht 63.0 in | Wt 123.0 lb

## 2017-05-11 DIAGNOSIS — D5720 Sickle-cell/Hb-C disease without crisis: Secondary | ICD-10-CM | POA: Diagnosis not present

## 2017-05-11 DIAGNOSIS — R000 Tachycardia, unspecified: Secondary | ICD-10-CM

## 2017-05-11 DIAGNOSIS — E559 Vitamin D deficiency, unspecified: Secondary | ICD-10-CM

## 2017-05-11 DIAGNOSIS — Z131 Encounter for screening for diabetes mellitus: Secondary | ICD-10-CM | POA: Diagnosis not present

## 2017-05-11 DIAGNOSIS — R600 Localized edema: Secondary | ICD-10-CM | POA: Diagnosis not present

## 2017-05-11 DIAGNOSIS — R739 Hyperglycemia, unspecified: Secondary | ICD-10-CM | POA: Diagnosis not present

## 2017-05-11 DIAGNOSIS — N184 Chronic kidney disease, stage 4 (severe): Secondary | ICD-10-CM

## 2017-05-11 DIAGNOSIS — Z79891 Long term (current) use of opiate analgesic: Secondary | ICD-10-CM | POA: Diagnosis not present

## 2017-05-11 DIAGNOSIS — R81 Glycosuria: Secondary | ICD-10-CM | POA: Diagnosis not present

## 2017-05-11 LAB — POCT URINALYSIS DIP (DEVICE)
BILIRUBIN URINE: NEGATIVE
GLUCOSE, UA: 250 mg/dL — AB
Ketones, ur: NEGATIVE mg/dL
Leukocytes, UA: NEGATIVE
NITRITE: NEGATIVE
Protein, ur: >=300 mg/dL — AB
Specific Gravity, Urine: 1.02 (ref 1.005–1.030)
Urobilinogen, UA: 0.2 mg/dL (ref 0.0–1.0)
pH: 8.5 — ABNORMAL HIGH (ref 5.0–8.0)

## 2017-05-11 NOTE — Progress Notes (Signed)
Chief Complaint  Patient presents with  . Establish Care  . Sickle Cell Anemia     Subjective:    Patient ID: [name] [name]., male    DOB: April 14, 1980, 37 y.o.   MRN: [id number]  HPI [name] [name], a 37 year old male history of sickle cell anemia, hemoglobin SS, chronic kidney disease and chronic pain presents to establish care. He was a patient of Dr. [name] prior to establishing here. Patient was previously admitted to inpatient services for sickle cell crisis.  He states that prior to crisis, he had been out of opiate medications.  Patient typically takes oxycodone 10 mg every 4 hours as needed for moderate-to-severe pain.  Current pain intensity is 7-8/10 characterized as intermittent and throbbing. He last had Oxycodone around 7 am without sustained relief. He has not been followed by hematology. He refrains from taking NSAIDs due to stage III chronic kidney disease.  Patient is followed by Dr. Erling Cruz,  nephrology frequently for this problem. During previous admission patient underwent a kidney biopsy. He has not scheduled follow up with nephrology to review results and treatment plan going forward.   Patient is complaining of weight loss hospital discharge.  He has been adherent to current medication regimen.  He does not follow a renal diet and his diet mostly consists of fast food and soft drinks.   He currently denies shortness of breath, headache, blurred vision, paresthesias, chest pain, nausea, vomiting, or diarrhea.  Past Medical History:  Diagnosis Date  . Arachnoid Cyst of brain bilaterally    "2 really small ones in the back of my head; inside; saw them w/MRI" (09/25/2012)  . Bacterial pneumonia ~ 2012   "caught it here in the hospital" (09/25/2012)  . Chronic kidney disease    "from my sickle cell" (09/25/2012)  . CKD (chronic kidney disease), stage II   . GERD (gastroesophageal reflux disease)    "after I eat alot of spicey foods" (09/25/2012)  .  Gynecomastia, male 07/10/2012  . History of blood transfusion    "always related to sickle cell crisis" (09/25/2012)  . 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 Migraines    "take RX qd to prevent them" (09/25/2012)  . Sickle cell anemia (HCC)   . Sickle cell crisis ([place]) 09/25/2012  . Sickle cell nephropathy ([place]) 07/10/2012  . Sinus tachycardia   . Tachycardia with heart rate 121-140 beats per minute with ambulation 08/04/2016   Social History   Socioeconomic History  . Marital status: Single    Spouse name: Not on file  . Number of children: Not on file  . Years of education: Not on file  . Highest education level: Not on file  Occupational History  . Not on file  Social Needs  . Financial resource strain: Not on file  . Food insecurity:    Worry: Not on file    Inability: Not on file  . Transportation needs:    Medical: Not on file    Non-medical: Not on file  Tobacco Use  . Smoking status: Current Some Day Smoker    Types: Cigarettes  . Smokeless tobacco: Never Used  . Tobacco comment: 09/25/2012 "I don't buy cigarettes; bum one from friends q now and then"  Substance and Sexual Activity  . Alcohol use: No    Alcohol/week: 0.0 oz    Frequency: Never  . Drug use: No    Types: Marijuana  Comment: 09/25/2012 "weed was my biggest problem; stopped that ~ 1 1/2 months ago; I don't do cocaine; can't say I never have though"  . Sexual activity: Yes  Lifestyle  . Physical activity:    Days per week: Not on file    Minutes per session: Not on file  . Stress: Not on file  Relationships  . Social connections:    Talks on phone: Not on file    Gets together: Not on file    Attends religious service: Not on file    Active member of club or organization: Not on file    Attends meetings of clubs or organizations: Not on file    Relationship status: Not on file  . Intimate partner violence:    Fear of current or  ex partner: Not on file    Emotionally abused: Not on file    Physically abused: Not on file    Forced sexual activity: Not on file  Other Topics Concern  . Not on file  Social History Narrative    Lives alone in [place].   Back at school, studying Public relations account executive. Unemployed.    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    Immunization History  Administered Date(s) Administered  . Influenza Split 12/08/2011  . Influenza,inj,Quad PF,6+ Mos 10/11/2012, 12/13/2016  . Pneumococcal Polysaccharide-23 12/13/2016  . Td 09/18/2004   Review of Systems  Constitutional: Positive for fatigue and unexpected weight change (weight loss).  HENT: Negative.   Eyes: Negative.  Negative for photophobia, redness and visual disturbance.  Respiratory: Negative.  Negative for cough and shortness of breath.   Cardiovascular: Negative.  Negative for chest pain, palpitations and leg swelling.       Swelling in feet  Gastrointestinal: Negative.   Endocrine: Negative.  Negative for polydipsia, polyphagia and polyuria.  Genitourinary: Negative.   Musculoskeletal: Positive for arthralgias and back pain.  Skin: Negative.   Allergic/Immunologic: Negative.   Neurological: Negative.  Negative for syncope, numbness and headaches.  Hematological: Negative.   Psychiatric/Behavioral: Negative.        Objective:   Physical Exam  Constitutional: He is oriented to person, place, and time. He appears well-developed and well-nourished. He has a sickly appearance.  HENT:  Head: Normocephalic and atraumatic.  Right Ear: External ear normal.  Left Ear: External ear normal.  Nose: Nose normal.  Mouth/Throat: Oropharynx is clear and moist.  Eyes: Pupils are equal, round, and reactive to light.  Neck: Normal range of motion. Neck supple.  Cardiovascular: Normal rate,  regular rhythm, normal heart sounds and intact distal pulses.  Pulmonary/Chest: Effort normal and breath sounds normal.  Abdominal: Soft. Bowel sounds are normal. There is no tenderness. There is no rebound.  Musculoskeletal:       Lumbar back: He exhibits decreased range of motion, tenderness and pain. He exhibits no spasm.  Neurological: He is alert and oriented to person, place, and time. He has normal reflexes.  Skin: Skin is warm and dry.  Psychiatric: He has a normal mood and affect. His behavior is normal. Judgment and thought content normal.      BP 111/66 (BP Location: Right Arm, Patient Position: Sitting, Cuff Size: Normal)   Pulse (!) 122   Temp 98.8 F (37.1 C) (Oral)   Resp 16   Ht 5\' 3"  (1.6 m)   Wt  123 lb (55.8 kg)   SpO2 100%   BMI 21.79 kg/m  Assessment & Plan:  1. Sickle cell with hemoglobin c anemia, without crisis ([place]) Will start folic acid 1 mg daily to prevent aplastic bone marrow crises.    Pulmonary evaluation - Patient denies severe recurrent wheezes, shortness of breath with exercise, or persistent cough. If these symptoms develop, pulmonary function tests with spirometry will be ordered, and if abnormal, plan on referral to Pulmonology for further evaluation.  Cardiac - Reviewed previous EKG  Eyes - High risk of proliferative retinopathy. Annual eye exam with retinal exam recommended to patient. TJ has not had a recent eye exam.    Immunization status - Patient up to date with pneumococcal vaccination. Will also review [facility].   Acute and chronic painful episodes - Reviewed the [place] reporting system, no inconsistencies noted. Patient is currently on 6 MME/day. We agreed on continuing Oxycodone 10 mg every 6 hours for moderate to severe pain. Patient says that he has around 30 tablets left. TJ and I discussed prescription refill policy, he expressed understanding. We also discussed that he is encouraged to choose pharmacy and receive schedule II medications  only from this clinic. TJ had a number of questions and concerns. His primary concern is what is he supposed to do if given prescriptions after hospital admissions and from the ER. TJ and I discussed at length. He is also aware that the prescription history is available to [place] online through the [facility]. Controlled substance agreement signed today. We reminded TJ that all patients receiving Schedule II narcotics must be seen for follow within one month of prescription being requested. We reviewed the terms of our pain agreement, including the need to keep medicines in a safe locked location away from children or pets, and the need to report excess sedation or constipation, measures to avoid constipation, and policies related to early refills and stolen prescriptions. According to the [place] [facility] program, we have reviewed details related to analgesia, adverse effects, aberrant behaviors.  - POCT urinalysis dip (device) - Ambulatory referral to Hematology - Ambulatory referral to Ophthalmology - CBC with Differential - Comprehensive metabolic panel - Reticulocytes - Oxycodone/Oxymorphone Confirm - Med List Option Not Selected  2. Tachycardia with heart rate 121-140 beats per minute with ambulation - TSH  3. Glucosuria - POCT urinalysis dip (device)  4. Hyperglycemia - POCT urinalysis dip (device) - Fructosamine  5. Vitamin D deficiency - Vitamin D, 25-hydroxy  6. Stage 4 chronic kidney disease ([place]) Will call to schedule first available appointment with nephrologist.  - POCT urinalysis dip (device)  7. Bilateral lower extremity edema Discussed the importance of following a renal diet. Increased sodium intake is contraindicated in chronic kidney disease.   8. Chronic prescription opiate use Reviewed [place] Substance Reporting system prior to prescribing opiate medications. No inconsistencies noted.    - [id number]+OXYCODONE+CRT-[id number]  9. Screening for diabetes  mellitus Will review fructosamine as results become available.   RTC: 1 month for chronic conditions   [name]  MSN, FNP-C [facility] [address] [place], [place] [address] [phone]

## 2017-05-11 NOTE — Patient Instructions (Addendum)
Thanks for establishing care with [name] [name].   Will follow up by phone with any abnormal laboratory results.   Follow-up with Dr. Florene Glen as scheduled for chronic kidney disease.   Day infusion center for acute sickle cell crisis.  Call Monday through Friday from 8 AM until 12.  No patients are taken after 1 PM  Prescription pick up days are Tuesday and Friday  I have sent referrals to hematology and ophthalmology     Sickle Cell Anemia, Adult Sickle cell anemia is a condition where your red blood cells are shaped like sickles. Red blood cells carry oxygen through the body. Sickle-shaped red blood cells do not live as long as normal red blood cells. They also clump together and block blood from flowing through the blood vessels. These things prevent the body from getting enough oxygen. Sickle cell anemia causes organ damage and pain. It also increases the risk of infection. Follow these instructions at home:  Drink enough fluid to keep your pee (urine) clear or pale yellow. Drink more in hot weather and during exercise.  Do not smoke. Smoking lowers oxygen levels in the blood.  Only take over-the-counter or prescription medicines as told by your doctor.  Take antibiotic medicines as told by your doctor. Make sure you finish them even if you start to feel better.  Take supplements as told by your doctor.  Consider wearing a medical alert bracelet. This tells anyone caring for you in an emergency of your condition.  When traveling, keep your medical information, doctors' names, and the medicines you take with you at all times.  If you have a fever, do not take fever medicines right away. This could cover up a problem. Tell your doctor.  Keep all follow-up visits with your doctor. Sickle cell anemia requires regular medical care. Contact a doctor if: You have a fever. Get help right away if:  You feel dizzy or faint.  You have new belly (abdominal) pain, especially on the left side  near the stomach area.  You have a lasting, often uncomfortable and painful erection of the penis (priapism). If it is not treated right away, you will become unable to have sex (impotence).  You have numbness in your arms or legs or you have a hard time moving them.  You have a hard time talking.  You have a fever or lasting symptoms for more than 2-3 days.  You have a fever and your symptoms suddenly get worse.  You have signs or symptoms of infection. These include: ? Chills. ? Being more tired than normal (lethargy). ? Irritability. ? Poor eating. ? Throwing up (vomiting).  You have pain that is not helped with medicine.  You have shortness of breath.  You have pain in your chest.  You are coughing up pus-like or bloody mucus.  You have a stiff neck.  Your feet or hands swell or have pain.  Your belly looks bloated.  Your joints hurt. This information is not intended to replace advice given to you by your health care provider. Make sure you discuss any questions you have with your health care provider. Document Released: 11/20/2012 Document Revised: 07/08/2015 Document Reviewed: 09/11/2012 Elsevier Interactive Patient Education  2017 [employer].

## 2017-05-12 ENCOUNTER — Emergency Department (HOSPITAL_COMMUNITY): Payer: Medicare Other

## 2017-05-12 ENCOUNTER — Encounter (HOSPITAL_COMMUNITY): Payer: Self-pay | Admitting: Emergency Medicine

## 2017-05-12 ENCOUNTER — Other Ambulatory Visit: Payer: Self-pay

## 2017-05-12 ENCOUNTER — Telehealth: Payer: Self-pay | Admitting: Family Medicine

## 2017-05-12 ENCOUNTER — Inpatient Hospital Stay (HOSPITAL_COMMUNITY)
Admission: EM | Admit: 2017-05-12 | Discharge: 2017-05-18 | DRG: 811 | Disposition: A | Discharge status: Home / Self Care | Payer: Medicare Other | Attending: Internal Medicine | Admitting: Internal Medicine

## 2017-05-12 DIAGNOSIS — N051 Unspecified nephritic syndrome with focal and segmental glomerular lesions: Secondary | ICD-10-CM | POA: Diagnosis present

## 2017-05-12 DIAGNOSIS — E46 Unspecified protein-calorie malnutrition: Secondary | ICD-10-CM | POA: Diagnosis present

## 2017-05-12 DIAGNOSIS — N041 Nephrotic syndrome with focal and segmental glomerular lesions: Secondary | ICD-10-CM | POA: Diagnosis not present

## 2017-05-12 DIAGNOSIS — K219 Gastro-esophageal reflux disease without esophagitis: Secondary | ICD-10-CM | POA: Diagnosis present

## 2017-05-12 DIAGNOSIS — F17210 Nicotine dependence, cigarettes, uncomplicated: Secondary | ICD-10-CM | POA: Diagnosis present

## 2017-05-12 DIAGNOSIS — E872 Acidosis: Secondary | ICD-10-CM | POA: Diagnosis present

## 2017-05-12 DIAGNOSIS — N189 Chronic kidney disease, unspecified: Secondary | ICD-10-CM | POA: Diagnosis not present

## 2017-05-12 DIAGNOSIS — N049 Nephrotic syndrome with unspecified morphologic changes: Secondary | ICD-10-CM | POA: Diagnosis not present

## 2017-05-12 DIAGNOSIS — Z452 Encounter for adjustment and management of vascular access device: Secondary | ICD-10-CM

## 2017-05-12 DIAGNOSIS — G43909 Migraine, unspecified, not intractable, without status migrainosus: Secondary | ICD-10-CM | POA: Diagnosis present

## 2017-05-12 DIAGNOSIS — Z6822 Body mass index (BMI) 22.0-22.9, adult: Secondary | ICD-10-CM | POA: Diagnosis not present

## 2017-05-12 DIAGNOSIS — G894 Chronic pain syndrome: Secondary | ICD-10-CM | POA: Diagnosis present

## 2017-05-12 DIAGNOSIS — D5700 Hb-SS disease with crisis, unspecified: Secondary | ICD-10-CM | POA: Diagnosis not present

## 2017-05-12 DIAGNOSIS — Z9049 Acquired absence of other specified parts of digestive tract: Secondary | ICD-10-CM | POA: Diagnosis not present

## 2017-05-12 DIAGNOSIS — Z803 Family history of malignant neoplasm of breast: Secondary | ICD-10-CM

## 2017-05-12 DIAGNOSIS — N170 Acute kidney failure with tubular necrosis: Secondary | ICD-10-CM | POA: Diagnosis present

## 2017-05-12 DIAGNOSIS — Z886 Allergy status to analgesic agent status: Secondary | ICD-10-CM

## 2017-05-12 DIAGNOSIS — R701 Abnormal plasma viscosity: Secondary | ICD-10-CM | POA: Diagnosis not present

## 2017-05-12 DIAGNOSIS — N183 Chronic kidney disease, stage 3 (moderate): Secondary | ICD-10-CM | POA: Diagnosis not present

## 2017-05-12 DIAGNOSIS — D638 Anemia in other chronic diseases classified elsewhere: Secondary | ICD-10-CM | POA: Diagnosis not present

## 2017-05-12 DIAGNOSIS — N182 Chronic kidney disease, stage 2 (mild): Secondary | ICD-10-CM | POA: Diagnosis not present

## 2017-05-12 DIAGNOSIS — D72829 Elevated white blood cell count, unspecified: Secondary | ICD-10-CM | POA: Diagnosis not present

## 2017-05-12 DIAGNOSIS — Z885 Allergy status to narcotic agent status: Secondary | ICD-10-CM | POA: Diagnosis not present

## 2017-05-12 DIAGNOSIS — N184 Chronic kidney disease, stage 4 (severe): Secondary | ICD-10-CM

## 2017-05-12 DIAGNOSIS — R079 Chest pain, unspecified: Secondary | ICD-10-CM | POA: Diagnosis not present

## 2017-05-12 DIAGNOSIS — D631 Anemia in chronic kidney disease: Secondary | ICD-10-CM | POA: Diagnosis present

## 2017-05-12 DIAGNOSIS — E538 Deficiency of other specified B group vitamins: Secondary | ICD-10-CM | POA: Diagnosis not present

## 2017-05-12 LAB — COMPREHENSIVE METABOLIC PANEL
A/G RATIO: 0.6 — AB (ref 1.2–2.2)
ALBUMIN: 1.4 g/dL — AB (ref 3.5–5.0)
ALK PHOS: 229 U/L — AB (ref 38–126)
ALT: 35 IU/L (ref 0–44)
ALT: 30 U/L (ref 17–63)
AST: 47 IU/L — ABNORMAL HIGH (ref 0–40)
AST: 45 U/L — AB (ref 15–41)
Albumin: 1.6 g/dL — CL (ref 3.5–5.5)
Alkaline Phosphatase: 279 IU/L — ABNORMAL HIGH (ref 39–117)
Anion gap: 8 (ref 5–15)
BILIRUBIN TOTAL: 0.8 mg/dL (ref 0.3–1.2)
BUN/Creatinine Ratio: 7 — ABNORMAL LOW (ref 9–20)
BUN: 16 mg/dL (ref 6–20)
BUN: 16 mg/dL (ref 6–20)
Bilirubin Total: 0.6 mg/dL (ref 0.0–1.2)
CALCIUM: 7.4 mg/dL — AB (ref 8.7–10.2)
CALCIUM: 7.6 mg/dL — AB (ref 8.9–10.3)
CO2: 22 mmol/L (ref 20–29)
CO2: 24 mmol/L (ref 22–32)
Chloride: 110 mmol/L — ABNORMAL HIGH (ref 96–106)
Chloride: 107 mmol/L (ref 101–111)
Creatinine, Ser: 2.38 mg/dL — ABNORMAL HIGH (ref 0.76–1.27)
Creatinine, Ser: 2.34 mg/dL — ABNORMAL HIGH (ref 0.61–1.24)
GFR calc Af Amer: 40 mL/min — ABNORMAL LOW (ref >60)
GFR calc non Af Amer: 35 mL/min — ABNORMAL LOW (ref >60)
GFR, EST AFRICAN AMERICAN: 40 mL/min/{1.73_m2} — AB (ref >59)
GFR, EST NON AFRICAN AMERICAN: 34 mL/min/{1.73_m2} — AB (ref >59)
GLOBULIN, TOTAL: 2.9 g/dL (ref 1.5–4.5)
GLUCOSE: 93 mg/dL (ref 65–99)
Glucose: 84 mg/dL (ref 65–99)
POTASSIUM: 3.7 mmol/L (ref 3.5–5.2)
POTASSIUM: 3.3 mmol/L — AB (ref 3.5–5.1)
SODIUM: 142 mmol/L (ref 134–144)
Sodium: 139 mmol/L (ref 135–145)
TOTAL PROTEIN: 4.5 g/dL — AB (ref 6.0–8.5)
Total Protein: 5 g/dL — ABNORMAL LOW (ref 6.5–8.1)

## 2017-05-12 LAB — RETICULOCYTES
RBC.: 2.33 MIL/uL — ABNORMAL LOW (ref 4.22–5.81)
Retic Count, Absolute: 35 10*3/uL (ref 19.0–186.0)
Retic Ct Pct: 1.5 % (ref 0.6–2.6)
Retic Ct Pct: 1.5 % (ref 0.4–3.1)

## 2017-05-12 LAB — URINALYSIS, ROUTINE W REFLEX MICROSCOPIC
Bacteria, UA: NONE SEEN
Bilirubin Urine: NEGATIVE
Ketones, ur: NEGATIVE mg/dL
Leukocytes, UA: NEGATIVE
Nitrite: NEGATIVE
PH: 9 — AB (ref 5.0–8.0)
Protein, ur: >=300 mg/dL — AB
SPECIFIC GRAVITY, URINE: 1.005 (ref 1.005–1.030)
SQUAMOUS EPITHELIAL / LPF: NONE SEEN

## 2017-05-12 LAB — CBC WITH DIFFERENTIAL/PLATELET
BASOS ABS: 0 10*3/uL (ref 0.0–0.1)
BASOS: 0 %
Basophils Absolute: 0.1 10*3/uL (ref 0.0–0.2)
Basophils Relative: 0 %
EOS (ABSOLUTE): 0.7 10*3/uL — ABNORMAL HIGH (ref 0.0–0.4)
EOS PCT: 4 %
EOS: 4 %
Eosinophils Absolute: 0.8 10*3/uL — ABNORMAL HIGH (ref 0.0–0.7)
HCT: 21.3 % — ABNORMAL LOW (ref 39.0–52.0)
HEMATOCRIT: 15.3 % — AB (ref 37.5–51.0)
Hemoglobin: 4.9 g/dL — CL (ref 13.0–17.7)
Hemoglobin: 7 g/dL — ABNORMAL LOW (ref 13.0–17.0)
IMMATURE GRANS (ABS): 0 10*3/uL (ref 0.0–0.1)
Immature Granulocytes: 0 %
LYMPHS PCT: 33 %
Lymphocytes Absolute: 7.9 10*3/uL — ABNORMAL HIGH (ref 0.7–3.1)
Lymphs Abs: 6 10*3/uL — ABNORMAL HIGH (ref 0.7–4.0)
Lymphs: 41 %
MCH: 29.3 pg (ref 26.6–33.0)
MCH: 30 pg (ref 26.0–34.0)
MCHC: 32 g/dL (ref 31.5–35.7)
MCHC: 32.9 g/dL (ref 30.0–36.0)
MCV: 92 fL (ref 79–97)
MCV: 91.4 fL (ref 78.0–100.0)
MONO ABS: 1.2 10*3/uL — AB (ref 0.1–1.0)
MONOS ABS: 0.9 10*3/uL (ref 0.1–0.9)
Monocytes Relative: 6 %
Monocytes: 5 %
NEUTROS ABS: 9.8 10*3/uL — AB (ref 1.4–7.0)
NEUTROS PCT: 50 %
Neutro Abs: 10.4 10*3/uL — ABNORMAL HIGH (ref 1.7–7.7)
Neutrophils Relative %: 57 %
PLATELETS: 641 10*3/uL — AB (ref 150–400)
Platelets: 805 10*3/uL (ref 150–379)
RBC: 1.67 x10E6/uL — CL (ref 4.14–5.80)
RBC: 2.33 MIL/uL — ABNORMAL LOW (ref 4.22–5.81)
RDW: 18.3 % — AB (ref 12.3–15.4)
RDW: 19.8 % — AB (ref 11.5–15.5)
WBC: 19.5 10*3/uL — ABNORMAL HIGH (ref 3.4–10.8)
WBC: 18.4 10*3/uL — ABNORMAL HIGH (ref 4.0–10.5)

## 2017-05-12 LAB — MED LIST OPTION NOT SELECTED

## 2017-05-12 LAB — VITAMIN D 25 HYDROXY (VIT D DEFICIENCY, FRACTURES): Vit D, 25-Hydroxy: <4 ng/mL — ABNORMAL LOW (ref 30.0–100.0)

## 2017-05-12 LAB — FRUCTOSAMINE: Fructosamine: 148 umol/L (ref 0–285)

## 2017-05-12 LAB — TSH: TSH: 2.86 u[IU]/mL (ref 0.450–4.500)

## 2017-05-12 MED ORDER — ONDANSETRON HCL 4 MG/2ML IJ SOLN
4.0000 mg | Freq: Four times a day (QID) | INTRAMUSCULAR | Status: DC | PRN
  Administered 2017-05-13: 4 mg via INTRAVENOUS
  Filled 2017-05-12: qty 2

## 2017-05-12 MED ORDER — HYDROMORPHONE HCL 2 MG/ML IJ SOLN
2.0000 mg | INTRAMUSCULAR | Status: DC
  Administered 2017-05-12: 2 mg via INTRAVENOUS
  Filled 2017-05-12: qty 1

## 2017-05-12 MED ORDER — DIPHENHYDRAMINE HCL 50 MG/ML IJ SOLN
25.0000 mg | INTRAMUSCULAR | Status: DC | PRN
  Filled 2017-05-12: qty 0.5

## 2017-05-12 MED ORDER — HYDROMORPHONE 1 MG/ML IV SOLN
INTRAVENOUS | Status: DC
  Administered 2017-05-12: 5 mg via INTRAVENOUS
  Administered 2017-05-12: 16:00:00 via INTRAVENOUS
  Administered 2017-05-12: 4 mg via INTRAVENOUS
  Administered 2017-05-13: 4.5 mg via INTRAVENOUS
  Administered 2017-05-13: 6 mg via INTRAVENOUS
  Administered 2017-05-13: 6.5 mg via INTRAVENOUS
  Administered 2017-05-13: 3 mg via INTRAVENOUS
  Administered 2017-05-13: 3.5 mg via INTRAVENOUS
  Administered 2017-05-13: 12:00:00 via INTRAVENOUS
  Administered 2017-05-14: 5.5 mg via INTRAVENOUS
  Filled 2017-05-12 (×3): qty 25

## 2017-05-12 MED ORDER — HYDROMORPHONE HCL 2 MG/ML IJ SOLN: 2.0000 mg | INTRAMUSCULAR | Status: DC

## 2017-05-12 MED ORDER — SODIUM CHLORIDE 0.9% FLUSH: 9.0000 mL | INTRAVENOUS | Status: DC | PRN

## 2017-05-12 MED ORDER — HYDROMORPHONE HCL 2 MG/ML IJ SOLN
2.0000 mg | INTRAMUSCULAR | Status: AC
  Administered 2017-05-12: 2 mg via INTRAVENOUS
  Filled 2017-05-12: qty 1

## 2017-05-12 MED ORDER — DIPHENHYDRAMINE HCL 25 MG PO CAPS
25.0000 mg | ORAL_CAPSULE | ORAL | Status: DC | PRN
  Administered 2017-05-12 – 2017-05-14 (×4): 50 mg via ORAL
  Filled 2017-05-12 (×4): qty 2

## 2017-05-12 MED ORDER — NALOXONE HCL 0.4 MG/ML IJ SOLN: 0.4000 mg | INTRAMUSCULAR | Status: DC | PRN

## 2017-05-12 MED ORDER — HYDROMORPHONE HCL 2 MG/ML IJ SOLN: 2.0000 mg | INTRAMUSCULAR | Status: AC

## 2017-05-12 MED ORDER — DEXTROSE-NACL 5-0.45 % IV SOLN
INTRAVENOUS | Status: DC
  Administered 2017-05-12 – 2017-05-14 (×5): via INTRAVENOUS

## 2017-05-12 MED ORDER — POTASSIUM CHLORIDE CRYS ER 20 MEQ PO TBCR
40.0000 meq | EXTENDED_RELEASE_TABLET | Freq: Once | ORAL | Status: AC
  Administered 2017-05-12: 40 meq via ORAL
  Filled 2017-05-12: qty 2

## 2017-05-12 MED ORDER — FOLIC ACID 1 MG PO TABS
1.0000 mg | ORAL_TABLET | Freq: Every day | ORAL | Status: DC
  Administered 2017-05-12 – 2017-05-14 (×3): 1 mg via ORAL
  Filled 2017-05-12 (×3): qty 1

## 2017-05-12 MED ORDER — DIPHENHYDRAMINE HCL 25 MG PO CAPS: 25.0000 mg | ORAL_CAPSULE | ORAL | Status: DC | PRN

## 2017-05-12 MED ORDER — ENOXAPARIN SODIUM 30 MG/0.3ML ~~LOC~~ SOLN
30.0000 mg | SUBCUTANEOUS | Status: DC
  Administered 2017-05-12 – 2017-05-13 (×2): 30 mg via SUBCUTANEOUS
  Filled 2017-05-12 (×2): qty 0.3

## 2017-05-12 NOTE — Telephone Encounter (Signed)
[name] [name]., 37 y.o.male with sickle cell anemia was contacted regarding critical lab results of hemoglobin 4.9.  He reports experiencing upper bilateral extremity pain. He was advised to go immediately to the ED for further evaluation for blood transfusion and admission if warranted.    [name], MSN, FNP-C [name]  [address] [name], [place] [address] [phone]

## 2017-05-12 NOTE — H&P (Signed)
H&P   Patient Demographics:    [name] [name], is a 37 y.o. male  MRN: [id number]   DOB - [birth year]  Admit Date - 05/12/2017  Outpatient Primary MD for the patient is [name], FNP  Chief Complaint  Patient presents with  . Sickle Cell Pain Crisis  . low HGB  . Leg Swelling     HPI:    [name] [name]  is a 37 y.o. male who presented to the ED with c/o gradual onset and worsening of constant acute flair of his chronic sickle cell "pains" for the past 1 week. Patient states he was seen by his PMD in the follow up yesterday for hospital follow up of his previous admission and had routine labs completed. Pt was called this morning and told to come to the ED for further evaluation/admission for "low Hb of 4.9." Denies any change in his usual sickle cell pains in his arms/legs. Denies CP/palpitations, no SOB/cough, no abd pain, no N/V/D, no rash, no fevers.   ED Course: Patient had repeat H and H and Hb was actually 7.0. He however did not respond well to pain control in the ED. Serum Creatinine is now at baseline of around 2.3. CXR showed "UPPER limits normal heart size without evidence of acute cardiopulmonary disease".    Review of systems:    In addition to the HPI above, patient reports No fever or chills No Headache, No changes with vision or hearing No problems swallowing food or liquids No chest pain, cough or shortness of breath No Abdominal pain, No Nausea or Vomiting, Bowel movements are regular No blood in stool or urine No dysuria No new skin rashes or bruises No new joints pains-aches No new weakness, tingling, numbness in any extremity No recent weight gain or loss No polyuria, polydypsia or polyphagia No significant Mental Stressors  A full 10 point Review of Systems was done, except as stated above, all other Review of Systems were negative.  With Past History of the following :   Past Medical History:  Diagnosis Date  . Arachnoid Cyst of brain  bilaterally    "2 really small ones in the back of my head; inside; saw them w/MRI" (09/25/2012)  . Bacterial pneumonia ~ 2012   "caught it here in the hospital" (09/25/2012)  . Chronic kidney disease    "from my sickle cell" (09/25/2012)  . CKD (chronic kidney disease), stage II   . GERD (gastroesophageal reflux disease)    "after I eat alot of spicey foods" (09/25/2012)  . Gynecomastia, male 07/10/2012  . History of blood transfusion    "always related to sickle cell crisis" (09/25/2012)  . 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 Migraines    "take RX qd to prevent them" (09/25/2012)  . Sickle cell anemia (HCC)   . Sickle cell crisis ([place]) 09/25/2012  . Sickle cell nephropathy ([place]) 07/10/2012  . Sinus tachycardia   . Tachycardia with heart rate 121-140 beats per minute with ambulation 08/04/2016      Past Surgical History:  Procedure Laterality Date  . CHOLECYSTECTOMY  ~ 2012  . COLONOSCOPY N/A 04/23/2017   Procedure: COLONOSCOPY;  Surgeon: Irene Shipper, MD;  Location: [facility];  Service: Endoscopy;  Laterality: N/A;  . IR FLUORO GUIDE CV LINE RIGHT  12/17/2016  . IR REMOVAL TUN CV CATH W/O FL  12/21/2016  . IR US GUIDE VASC ACCESS RIGHT  12/17/2016  .  spleenectomy       Social History:     Social History   Tobacco Use  . Smoking status: Current Some Day Smoker    Types: Cigarettes  . Smokeless tobacco: Never Used  . Tobacco comment: 09/25/2012 "I don't buy cigarettes; bum one from friends q now and then"  Substance Use Topics  . Alcohol use: No    Alcohol/week: 0.0 oz    Frequency: Never     Lives - At home   Family History :     Family History  Problem Relation Age of Onset  . Breast cancer Mother      Home Medications:   Prior to Admission medications   Medication Sig Start Date End Date Taking? Authorizing Provider  enalapril (VASOTEC) 5 MG tablet Take 5 mg by mouth daily. 12/09/16    [provider]  folic acid (FOLVITE) 1 MG tablet Take 1 tablet (1 mg total) daily by mouth. 12/22/16   Leana Gamer, MD  nortriptyline (PAMELOR) 25 MG capsule TAKE 1 CAPSULE (25 MG TOTAL) BY MOUTH AT BEDTIME. 01/18/17   Donnamae Jude, MD  Oxycodone HCl 10 MG TABS Take 1 tablet (10 mg total) by mouth every 6 (six) hours as needed. 05/03/17   [name], MD  sodium bicarbonate 650 MG tablet Take 1 tablet (650 mg total) 3 (three) times daily by mouth. 12/21/16   Leana Gamer, MD     Allergies:     Allergies  Allergen Reactions  . Nsaids   . Morphine And Related Other (See Comments)    "real bad headaches"     Physical Exam:  Vitals:  Vitals:   05/12/17 1300 05/12/17 1330  BP: 97/68 96/73  Pulse: (!) 129 (!) 104  Resp: 20 20  Temp:    SpO2: 100% 100%    Physical Exam: Constitutional: Patient appears well-developed and well-nourished. Not in obvious distress. HENT: Bilateral Parotid fullness. Normocephalic, atraumatic, External right and left ear normal. Oropharynx is clear and moist.  Eyes: Conjunctivae and EOM are normal. PERRLA, no scleral icterus. Neck: Normal ROM. Neck supple. No JVD. No tracheal deviation. No thyromegaly. CVS: RRR, S1/S2 +, no murmurs, no gallops, no carotid bruit.  Pulmonary: Effort and breath sounds normal, no stridor, rhonchi, wheezes, rales.  Abdominal: Soft. BS +, no distension, tenderness, rebound or guarding.  Musculoskeletal: Bilateral lower limb pitting pedal edema 2+ with pain to touch Lymphadenopathy: No lymphadenopathy noted, cervical, inguinal or axillary Neuro: Alert. Normal reflexes, muscle tone coordination. No cranial nerve deficit. Skin: Skin is warm and dry. No rash noted. Not diaphoretic. No erythema. No pallor. Psychiatric: Normal mood and affect. Behavior, judgment, thought content normal.   Data Review:    CBC Recent Labs  Lab 05/11/17 1449 05/12/17 1000  WBC 19.5* 18.4*  HGB 4.9* 7.0*  HCT  15.3* 21.3*  PLT 805* 641*  MCV 92 91.4  MCH 29.3 30.0  MCHC 32.0 32.9  RDW 18.3* 19.8*  LYMPHSABS 7.9* 6.0*  MONOABS  --  1.2*  EOSABS 0.7* 0.8*  BASOSABS 0.1 0.0   Chemistries  Recent Labs  Lab 05/11/17 1449 05/12/17 1000  NA 142 139  K 3.7 3.3*  CL 110* 107  CO2 22 24  GLUCOSE 84 93  BUN 16 16  CREATININE 2.38* 2.34*  CALCIUM 7.4* 7.6*  AST 47* 45*  ALT 35 30  ALKPHOS 279* 229*  BILITOT 0.6 0.8   Recent Labs    05/11/17 1449  TSH 2.860  Urinalysis    Component Value Date/Time   COLORURINE YELLOW 05/12/2017 1046   APPEARANCEUR CLEAR 05/12/2017 1046   LABSPEC 1.005 05/12/2017 1046   PHURINE 9.0 (H) 05/12/2017 1046   GLUCOSEU >=500 (A) 05/12/2017 1046   HGBUR SMALL (A) 05/12/2017 1046   HGBUR small 03/10/2010 1445   BILIRUBINUR NEGATIVE 05/12/2017 1046   KETONESUR NEGATIVE 05/12/2017 1046   PROTEINUR >=300 (A) 05/12/2017 1046   UROBILINOGEN 0.2 05/11/2017 1335   NITRITE NEGATIVE 05/12/2017 1046   LEUKOCYTESUR NEGATIVE 05/12/2017 1046    ----------------------------------------------------------------------------------------------------------------   Imaging Results:    Dg Chest 2 View  Result Date: 05/12/2017 CLINICAL DATA:  Acute chest pain today. Patient with sickle cell disease. EXAM: CHEST - 2 VIEW COMPARISON:  04/24/2017 and prior radiographs FINDINGS: UPPER limits normal heart size and increased pulmonary vascularity noted. Mild chronic streaky pulmonary opacities within the LOWER lungs noted. No acute airspace disease, pleural effusion, consolidation or mass noted. No acute bony abnormalities are identified. IMPRESSION: UPPER limits normal heart size without evidence of acute cardiopulmonary disease. Electronically Signed   By: Margarette Canada M.D.   On: 05/12/2017 11:38    Assessment & Plan:    Active Problems:   Sickle cell anemia with crisis ([place])  1. Hb SS with Crisis: Start IVF gentle hydration, IV Dilaudid via PCA weight based, unable to  receive Toradol because of CKD.  2. Sickle Cell Anemia: Hb was reported to be 4.9 yesterday but repeat Hb in ED was 7.0. However, this may drop a little after hydration. No need for blood transfusion now. Continue to monitor.  3. Leukocytosis: This is a feature of sickle cell crisis. However it may also be related to other factors. However a review of his records show that he has chronically elevated WBC. Continue to monitor. No indication for antibiotics. 4. Chronic Pain Syndrome: Pt reports that he  takes about 20 mg of Oxycodone daily.  5. Chronic Metabolic Acidosis: Pt takes Sodium Bicarbonate on a chronic basis. Serum bicarbonate normal.  6. CKD III: Renal function is at baseline of CKD. Will continue to monitor. Avoid NSAIDs and any nephrotoxin. Patient has not been adherent to dietary advise. He constantly ordered fast food to his room and indulged in lots of salty food. We discussed at length the benefit of adhering with Renal Diets and food with low sodium content.  DVT Prophylaxis: Subcut Lovenox   AM Labs Ordered, also please review Full Orders  Family Communication: Admission, patient's condition and plan of care including tests being ordered have been discussed with the patient who indicate understanding and agree with the plan and Code Status.  Code Status: Full Code  Consults called: None    Admission status: Inpatient    Time spent in minutes : 50 minutes  Angelica Chessman MD, MHA, CPE, FACP 05/12/2017 at 2:10 PM

## 2017-05-12 NOTE — ED Notes (Signed)
ED Provider at bedside. 

## 2017-05-12 NOTE — ED Triage Notes (Signed)
Pt was told by PCP to come to ED urgently as HGB 4. Pt having sickle cell pain in legs, back and chest.

## 2017-05-12 NOTE — ED Provider Notes (Signed)
[facility] DEPT Provider Note   CSN: [id number] Arrival date & time: 05/12/17  0945     History   Chief Complaint Chief Complaint  Patient presents with  . Sickle Cell Pain Crisis  . low HGB  . Leg Swelling    HPI [name] [name]. is a 37 y.o. male.  HPI  Pt was seen at 1020.  Per pt, c/o gradual onset and worsening of constant acute flair of his chronic sickle cell "pains" for the past 1 week. Pt states he was seen by his PMD in follow up yesterday from his previous admission and had routine labs completed. Pt was called this morning and told to come to the ED for further evaluation/admission for "low blood counts." Denies any change in his usual sickle cell pains in his arms/legs. Denies CP/palpitations, no SOB/cough, no abd pain, no N/V/D, no rash, no fevers.   Past Medical History:  Diagnosis Date  . Arachnoid Cyst of brain bilaterally    "2 really small ones in the back of my head; inside; saw them w/MRI" (09/25/2012)  . Bacterial pneumonia ~ 2012   "caught it here in the hospital" (09/25/2012)  . Chronic kidney disease    "from my sickle cell" (09/25/2012)  . CKD (chronic kidney disease), stage II   . GERD (gastroesophageal reflux disease)    "after I eat alot of spicey foods" (09/25/2012)  . Gynecomastia, male 07/10/2012  . History of blood transfusion    "always related to sickle cell crisis" (09/25/2012)  . 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   Noted in noted from [facility] at [facility]  . Migraines    "take RX qd to prevent them" (09/25/2012)  . Sickle cell anemia (HCC)   . Sickle cell crisis ([place]) 09/25/2012  . Sickle cell nephropathy ([place]) 07/10/2012  . Sinus tachycardia   . Tachycardia with heart rate 121-140 beats per minute with ambulation 08/04/2016    Patient Active Problem List   Diagnosis Date Noted  . LLQ abdominal pain   . Nephrotic syndrome   . Abnormal CT of the abdomen   . Immune-complex  glomerulonephritis   . Other ascites   . RUQ pain   . Nausea vomiting and diarrhea 04/20/2017  . Colitis 04/07/2017  . Abnormal liver function   . HCAP (healthcare-associated pneumonia)   . QT prolongation   . Pneumonia 02/27/2017  . Elevated troponin 02/27/2017  . Diarrhea 02/27/2017  . Soft tissue swelling of chest wall 12/18/2016  . Hypoxia   . Acute kidney injury superimposed on chronic kidney disease ([place]) 12/13/2016  . Vasoocclusive sickle cell crisis ([place]) 12/13/2016  . Sickle cell crisis ([place]) 10/14/2016  . Hyponatremia 10/14/2016  . Tachycardia with heart rate 121-140 beats per minute with ambulation 08/04/2016  . Metabolic acidosis 00/37/0488  . Leucocytosis 08/02/2016  . Anemia 08/02/2016  . Macrocytosis due to Hydroxyurea 08/02/2016  . Acute renal failure superimposed on chronic kidney disease ([place])   . AKI (acute kidney injury) ([place])   . Sickle cell pain crisis ([place]) 01/24/2013  . Chronic, continuous use of opioids 08/30/2012  . Chronic headaches 07/10/2012  . Gynecomastia, male 07/10/2012  . Sickle cell nephropathy ([place]) 07/10/2012  . Tachycardia 12/08/2011  . Systolic murmur 89/16/9450  . SICKLE CELL CRISIS 01/04/2010  . Migraine 11/26/2009  . CHRONIC KIDNEY DISEASE STAGE II (MILD) 03/06/2009  . Sickle cell disease, type SS.  06/18/2008  . TOBACCO ABUSE 05/22/2007    Past Surgical History:  Procedure Laterality Date  . CHOLECYSTECTOMY  ~ 2012  . COLONOSCOPY N/A 04/23/2017   Procedure: COLONOSCOPY;  Surgeon: Irene Shipper, MD;  Location: [facility];  Service: Endoscopy;  Laterality: N/A;  . IR FLUORO GUIDE CV LINE RIGHT  12/17/2016  . IR REMOVAL TUN CV CATH W/O FL  12/21/2016  . IR US GUIDE VASC ACCESS RIGHT  12/17/2016  . spleenectomy          Home Medications    Prior to Admission medications   Medication Sig Start Date End Date Taking? Authorizing Provider  enalapril (VASOTEC) 5 MG tablet Take 5 mg by mouth daily. 12/09/16   [provider]  folic acid (FOLVITE) 1 MG tablet Take 1 tablet (1 mg total) daily by mouth. 12/22/16   Leana Gamer, MD  Menthol-Methyl Salicylate (MUSCLE RUB) 10-15 % CREA Apply 1 application as needed topically for muscle pain. Patient not taking: Reported on 04/19/2017 12/21/16   Leana Gamer, MD  nortriptyline (PAMELOR) 25 MG capsule TAKE 1 CAPSULE (25 MG TOTAL) BY MOUTH AT BEDTIME. 01/18/17   Donnamae Jude, MD  Oxycodone HCl 10 MG TABS Take 1 tablet (10 mg total) by mouth every 6 (six) hours as needed. 05/03/17   [name], MD  sodium bicarbonate 650 MG tablet Take 1 tablet (650 mg total) 3 (three) times daily by mouth. 12/21/16   Leana Gamer, MD    Family History Family History  Problem Relation Age of Onset  . Breast cancer Mother     Social History Social History   Tobacco Use  . Smoking status: Current Some Day Smoker    Types: Cigarettes  . Smokeless tobacco: Never Used  . Tobacco comment: 09/25/2012 "I don't buy cigarettes; bum one from friends q now and then"  Substance Use Topics  . Alcohol use: No    Alcohol/week: 0.0 oz    Frequency: Never  . Drug use: No    Types: Marijuana    Comment: 09/25/2012 "weed was my biggest problem; stopped that ~ 1 1/2 months ago; I don't do cocaine; can't say I never have though"     Allergies   Nsaids and Morphine and related   Review of Systems Review of Systems ROS: Statement: All systems negative except as marked or noted in the HPI; Constitutional: Negative for fever and chills. ; ; Eyes: Negative for eye pain, redness and discharge. ; ; ENMT: Negative for ear pain, hoarseness, nasal congestion, sinus pressure and sore throat. ; ; Cardiovascular: Negative for chest pain, palpitations, diaphoresis, dyspnea and peripheral edema. ; ; Respiratory: Negative for cough, wheezing and stridor. ; ; Gastrointestinal: Negative for nausea, vomiting, diarrhea, abdominal pain, blood in stool, hematemesis, jaundice and rectal  bleeding. . ; ; Genitourinary: Negative for dysuria, flank pain and hematuria. ; ; Musculoskeletal: +arms and legs pain. Negative for back pain and neck pain. Negative for swelling and trauma.; ; Skin: Negative for pruritus, rash, abrasions, blisters, bruising and skin lesion.; ; Neuro: Negative for headache, lightheadedness and neck stiffness. Negative for weakness, altered level of consciousness, altered mental status, extremity weakness, paresthesias, involuntary movement, seizure and syncope.       Physical Exam Updated Vital Signs BP 101/81   Pulse (!) 120   Temp 98.2 F (36.8 C) (Oral)   Resp (!) 25   Ht 5\' 3"  (1.6 m)   Wt 56.7 kg (125 lb)   SpO2 100%   BMI 22.14 kg/m    Patient Vitals  for the past 24 hrs:  BP Temp Temp src Pulse Resp SpO2 Height Weight  05/12/17 1059 - - - - - - 5\' 3"  (1.6 m) 56.7 kg (125 lb)  05/12/17 1057 101/81 - - (!) 120 (!) 25 100 % - -  05/12/17 0958 - - - - - - 5\' 3"  (1.6 m) -  05/12/17 8756 112/63 98.2 F (36.8 C) Oral (!) 117 18 100 % - -     Physical Exam 1025: Physical examination:  Nursing notes reviewed; Vital signs and O2 SAT reviewed;  Constitutional: Well developed, Well nourished, Well hydrated, In no acute distress; Head:  Normocephalic, atraumatic; Eyes: EOMI, PERRL, No scleral icterus; ENMT: Mouth and pharynx normal, Mucous membranes moist; Neck: Supple, Full range of motion, No lymphadenopathy; Cardiovascular: Tachycardic rate and rhythm, No gallop; Respiratory: Breath sounds clear & equal bilaterally, No wheezes.  Speaking full sentences with ease, Normal respiratory effort/excursion; Chest: Nontender, Movement normal; Abdomen: Soft, Nontender, Nondistended, Normal bowel sounds; Genitourinary: No CVA tenderness; Extremities: Peripheral pulses normal, No tenderness, +1 pedal edema bilat. No calf edema or asymmetry.; Neuro: AA&Ox3, Major CN grossly intact.  Speech clear. No gross focal motor or sensory deficits in extremities.; Skin: Color  normal, Warm, Dry.   ED Treatments / Results  Labs (all labs ordered are listed, but only abnormal results are displayed)   EKG None  Radiology   Procedures Procedures (including critical care time)  Medications Ordered in ED Medications  dextrose 5 %-0.45 % sodium chloride infusion ( Intravenous New Bag/Given 05/12/17 1100)  HYDROmorphone (DILAUDID) injection 2 mg (has no administration in time range)    Or  HYDROmorphone (DILAUDID) injection 2 mg (has no administration in time range)  HYDROmorphone (DILAUDID) injection 2 mg (has no administration in time range)    Or  HYDROmorphone (DILAUDID) injection 2 mg (has no administration in time range)  HYDROmorphone (DILAUDID) injection 2 mg (has no administration in time range)    Or  HYDROmorphone (DILAUDID) injection 2 mg (has no administration in time range)  HYDROmorphone (DILAUDID) injection 2 mg (2 mg Intravenous Given 05/12/17 1100)    Or  HYDROmorphone (DILAUDID) injection 2 mg ( Subcutaneous See Alternative 05/12/17 1100)     Initial Impression / Assessment and Plan / ED Course  I have reviewed the triage vital signs and the nursing notes.  Pertinent labs & imaging results that were available during my care of the patient were reviewed by me and considered in my medical decision making (see chart for details).  MDM Reviewed: previous chart, nursing note and vitals Reviewed previous: labs Interpretation: labs and x-ray   Results for orders placed or performed during the hospital encounter of 05/12/17  Comprehensive metabolic panel  Result Value Ref Range   Sodium 139 135 - 145 mmol/L   Potassium 3.3 (L) 3.5 - 5.1 mmol/L   Chloride 107 101 - 111 mmol/L   CO2 24 22 - 32 mmol/L   Glucose, Bld 93 65 - 99 mg/dL   BUN 16 6 - 20 mg/dL   Creatinine, Ser 2.34 (H) 0.61 - 1.24 mg/dL   Calcium 7.6 (L) 8.9 - 10.3 mg/dL   Total Protein 5.0 (L) 6.5 - 8.1 g/dL   Albumin 1.4 (L) 3.5 - 5.0 g/dL   AST 45 (H) 15 - 41 U/L    ALT 30 17 - 63 U/L   Alkaline Phosphatase 229 (H) 38 - 126 U/L   Total Bilirubin 0.8 0.3 - 1.2 mg/dL   GFR calc  non Af Amer 35 (L) >60 mL/min   GFR calc Af Amer 40 (L) >60 mL/min   Anion gap 8 5 - 15  CBC with Differential  Result Value Ref Range   WBC 18.4 (H) 4.0 - 10.5 K/uL   RBC 2.33 (L) 4.22 - 5.81 MIL/uL   Hemoglobin 7.0 (L) 13.0 - 17.0 g/dL   HCT 21.3 (L) 39.0 - 52.0 %   MCV 91.4 78.0 - 100.0 fL   MCH 30.0 26.0 - 34.0 pg   MCHC 32.9 30.0 - 36.0 g/dL   RDW 19.8 (H) 11.5 - 15.5 %   Platelets 641 (H) 150 - 400 K/uL   Neutrophils Relative % 57 %   Neutro Abs 10.4 (H) 1.7 - 7.7 K/uL   Lymphocytes Relative 33 %   Lymphs Abs 6.0 (H) 0.7 - 4.0 K/uL   Monocytes Relative 6 %   Monocytes Absolute 1.2 (H) 0.1 - 1.0 K/uL   Eosinophils Relative 4 %   Eosinophils Absolute 0.8 (H) 0.0 - 0.7 K/uL   Basophils Relative 0 %   Basophils Absolute 0.0 0.0 - 0.1 K/uL  Reticulocytes  Result Value Ref Range   Retic Ct Pct 1.5 0.4 - 3.1 %   RBC. 2.33 (L) 4.22 - 5.81 MIL/uL   Retic Count, Absolute 35.0 19.0 - 186.0 K/uL  Urinalysis, Routine w reflex microscopic  Result Value Ref Range   Color, Urine YELLOW YELLOW   APPearance CLEAR CLEAR   Specific Gravity, Urine 1.005 1.005 - 1.030   pH 9.0 (H) 5.0 - 8.0   Glucose, UA >=500 (A) NEGATIVE mg/dL   Hgb urine dipstick SMALL (A) NEGATIVE   Bilirubin Urine NEGATIVE NEGATIVE   Ketones, ur NEGATIVE NEGATIVE mg/dL   Protein, ur >=300 (A) NEGATIVE mg/dL   Nitrite NEGATIVE NEGATIVE   Leukocytes, UA NEGATIVE NEGATIVE   RBC / HPF 0-5 0 - 5 RBC/hpf   WBC, UA 0-5 0 - 5 WBC/hpf   Bacteria, UA NONE SEEN NONE SEEN   Squamous Epithelial / LPF NONE SEEN NONE SEEN  Type and screen [place]  Result Value Ref Range   ABO/RH(D) O POS    Antibody Screen NEG    Sample Expiration      05/15/2017 Performed at [facility], [place] [address] [place], [place] [address]    Dg Chest 2 View Result Date: 05/12/2017 CLINICAL  DATA:  Acute chest pain today. Patient with sickle cell disease. EXAM: CHEST - 2 VIEW COMPARISON:  04/24/2017 and prior radiographs FINDINGS: UPPER limits normal heart size and increased pulmonary vascularity noted. Mild chronic streaky pulmonary opacities within the LOWER lungs noted. No acute airspace disease, pleural effusion, consolidation or mass noted. No acute bony abnormalities are identified. IMPRESSION: UPPER limits normal heart size without evidence of acute cardiopulmonary disease. Electronically Signed   By: Margarette Canada M.D.   On: 05/12/2017 11:38     1235:  BUN/Cr and H/H per baseline. Potassium repleted PO. Pt continues to c/o pain despite multiple doses of IV pain meds. Dx and testing d/w pt and family.  Questions answered.  Verb understanding, agreeable to admit. T/C returned from [facility] Dr. Doreene Burke, case discussed, including:  HPI, pertinent PM/SHx, VS/PE, dx testing, ED course and treatment:  Agreeable to come to ED for evaluation for admission.         Final Clinical Impressions(s) / ED Diagnoses   Final diagnoses:  None    ED Discharge Orders    None  Francine Graven, DO 05/15/17 1242

## 2017-05-13 ENCOUNTER — Other Ambulatory Visit: Payer: Self-pay | Admitting: Family Medicine

## 2017-05-13 DIAGNOSIS — E559 Vitamin D deficiency, unspecified: Secondary | ICD-10-CM

## 2017-05-13 LAB — COMPREHENSIVE METABOLIC PANEL
ALK PHOS: 196 U/L — AB (ref 38–126)
ALT: 26 U/L (ref 17–63)
ANION GAP: 6 (ref 5–15)
AST: 37 U/L (ref 15–41)
Albumin: 1.2 g/dL — ABNORMAL LOW (ref 3.5–5.0)
BILIRUBIN TOTAL: 0.4 mg/dL (ref 0.3–1.2)
BUN: 14 mg/dL (ref 6–20)
CALCIUM: 7.5 mg/dL — AB (ref 8.9–10.3)
CO2: 22 mmol/L (ref 22–32)
Chloride: 108 mmol/L (ref 101–111)
Creatinine, Ser: 2.26 mg/dL — ABNORMAL HIGH (ref 0.61–1.24)
GFR calc non Af Amer: 36 mL/min — ABNORMAL LOW (ref >60)
GFR, EST AFRICAN AMERICAN: 42 mL/min — AB (ref >60)
Glucose, Bld: 120 mg/dL — ABNORMAL HIGH (ref 65–99)
Potassium: 3.8 mmol/L (ref 3.5–5.1)
Sodium: 136 mmol/L (ref 135–145)
TOTAL PROTEIN: 4.3 g/dL — AB (ref 6.5–8.1)

## 2017-05-13 LAB — CBC
HCT: 18.3 % — ABNORMAL LOW (ref 39.0–52.0)
HEMOGLOBIN: 6 g/dL — AB (ref 13.0–17.0)
MCH: 30.2 pg (ref 26.0–34.0)
MCHC: 32.8 g/dL (ref 30.0–36.0)
MCV: 92 fL (ref 78.0–100.0)
Platelets: 552 10*3/uL — ABNORMAL HIGH (ref 150–400)
RBC: 1.99 MIL/uL — AB (ref 4.22–5.81)
RDW: 20 % — ABNORMAL HIGH (ref 11.5–15.5)
WBC: 19.8 10*3/uL — ABNORMAL HIGH (ref 4.0–10.5)

## 2017-05-13 LAB — PREPARE RBC (CROSSMATCH)

## 2017-05-13 MED ORDER — ALUM & MAG HYDROXIDE-SIMETH 200-200-20 MG/5ML PO SUSP
30.0000 mL | ORAL | Status: DC | PRN
  Administered 2017-05-13 – 2017-05-18 (×3): 30 mL via ORAL
  Filled 2017-05-13 (×3): qty 30

## 2017-05-13 MED ORDER — ERGOCALCIFEROL 1.25 MG (50000 UT) PO CAPS: 50000.0000 [IU] | ORAL_CAPSULE | ORAL | 1 refills | Status: DC

## 2017-05-13 MED ORDER — SODIUM CHLORIDE 0.9 % IV SOLN
Freq: Once | INTRAVENOUS | Status: AC
  Administered 2017-05-13: 07:00:00 via INTRAVENOUS

## 2017-05-13 NOTE — Progress Notes (Signed)
Patient ID: [name] [name]., male   DOB: 07/05/1980, 37 y.o.   MRN: [id number] Subjective:  Patient reports no new complaint but unhappy with his diet order. He said "I want to eat whatever I like", "I want chicken and other stuff". He is currently on low sodium diet due to edematous state. He denies any chest pain, denies SOB.   Objective:  Vital signs in last 24 hours:  Vitals:   05/13/17 1205 05/13/17 1350 05/13/17 1713 05/13/17 1754  BP:  111/66  112/71  Pulse:  97  (!) 103  Resp: 15 12 11 17   Temp:  98.5 F (36.9 C)  98.9 F (37.2 C)  TempSrc:  Oral  Oral  SpO2: 98% 100% 100% 100%  Weight:      Height:       Intake/Output from previous day:  Intake/Output Summary (Last 24 hours) at 05/13/2017 1849 Last data filed at 05/13/2017 1755 Gross per 24 hour  Intake 3324.5 ml  Output 3925 ml  Net -600.5 ml   Physical Exam: General: Alert, awake, oriented x3, in no acute distress.  HEENT: [place]/AT PEERL, EOMI. Bilateral parotid fullness. Neck: Trachea midline,  no masses, no thyromegal,y no JVD, no carotid bruit OROPHARYNX:  Moist, No exudate/ erythema/lesions.  Heart: Regular rate and rhythm, without murmurs, rubs, gallops, PMI non-displaced, no heaves or thrills on palpation.  Lungs: Clear to auscultation, no wheezing or rhonchi noted. No increased vocal fremitus resonant to percussion  Abdomen: Soft, nontender, nondistended, positive bowel sounds, no masses no hepatosplenomegaly noted..  Neuro: No focal neurological deficits noted cranial nerves II through XII grossly intact. DTRs 2+ bilaterally upper and lower extremities. Strength 5 out of 5 in bilateral upper and lower extremities. Musculoskeletal: Bilateral pitting lower limb edema, tender to touch.  Psychiatric: Patient alert and oriented x3, good insight and cognition, good recent to remote recall. Lymph node survey: No cervical axillary or inguinal lymphadenopathy noted.  Lab Results:  Basic Metabolic Panel:     Component Value Date/Time   NA 136 05/13/2017 0346   NA 142 05/11/2017 1449   K 3.8 05/13/2017 0346   CL 108 05/13/2017 0346   CO2 22 05/13/2017 0346   BUN 14 05/13/2017 0346   BUN 16 05/11/2017 1449   CREATININE 2.26 (H) 05/13/2017 0346   CREATININE 1.45 (H) 05/12/2014 1430   GLUCOSE 120 (H) 05/13/2017 0346   CALCIUM 7.5 (L) 05/13/2017 0346   CBC:    Component Value Date/Time   WBC 19.8 (H) 05/13/2017 0346   HGB 6.0 (LL) 05/13/2017 0346   HGB 4.9 (LL) 05/11/2017 1449   HCT 18.3 (L) 05/13/2017 0346   HCT 15.3 (LL) 05/11/2017 1449   PLT 552 (H) 05/13/2017 0346   PLT 805 (HH) 05/11/2017 1449   MCV 92.0 05/13/2017 0346   MCV 92 05/11/2017 1449   NEUTROABS 10.4 (H) 05/12/2017 1000   NEUTROABS 9.8 (H) 05/11/2017 1449   LYMPHSABS 6.0 (H) 05/12/2017 1000   LYMPHSABS 7.9 (H) 05/11/2017 1449   MONOABS 1.2 (H) 05/12/2017 1000   EOSABS 0.8 (H) 05/12/2017 1000   EOSABS 0.7 (H) 05/11/2017 1449   BASOSABS 0.0 05/12/2017 1000   BASOSABS 0.1 05/11/2017 1449    No results found for this or any previous visit (from the past 240 hour(s)).  Studies/Results: Dg Chest 2 View  Result Date: 05/12/2017 CLINICAL DATA:  Acute chest pain today. Patient with sickle cell disease. EXAM: CHEST - 2 VIEW COMPARISON:  04/24/2017 and prior radiographs FINDINGS: UPPER limits  normal heart size and increased pulmonary vascularity noted. Mild chronic streaky pulmonary opacities within the LOWER lungs noted. No acute airspace disease, pleural effusion, consolidation or mass noted. No acute bony abnormalities are identified. IMPRESSION: UPPER limits normal heart size without evidence of acute cardiopulmonary disease. Electronically Signed   By: Harmon Pier M.D.   On: 05/12/2017 11:38    Medications: Scheduled Meds: . enoxaparin (LOVENOX) injection  30 mg Subcutaneous Q24H  . folic acid  1 mg Oral Daily  . HYDROmorphone   Intravenous Q4H   Continuous Infusions: . dextrose 5 % and 0.45% NaCl 50 mL/hr at  05/13/17 1310  . diphenhydrAMINE     PRN Meds:.alum & mag hydroxide-simeth, diphenhydrAMINE **OR** diphenhydrAMINE, diphenhydrAMINE, naloxone **AND** sodium chloride flush, ondansetron (ZOFRAN) IV  Assessment/Plan: Active Problems:   Chronic kidney disease   Sickle cell anemia with pain (HCC)   Sickle cell anemia with crisis (HCC)  1. Hb SS with Crisis: Reduce IVF to 50 cc/hr for gentle hydration, continue IV Dilaudid via PCA weight based, unable to receive Toradol because of CKD.  2. Sickle Cell Anemia: Hb was reported to be 4.9 but repeat Hb in ED was 7.0. Today Hb has dropped to 6.0, asymptomatic. Continue to monitor, may need to be transfused if Hb drops further below 6.  3. Leukocytosis: This is a feature of sickle cell crisis. However it may also be related to other factors. However a review of his records show that he has chronically elevated WBC. Continue to monitor. No indication for antibiotics. 4. Chronic Pain Syndrome: Ptreports that hetakes about 20 mg of Oxycodone daily. Will start when Dilaudid is weaned off 5. Chronic Metabolic Acidosis: Pt takes Sodium Bicarbonate on a chronic basis. Serum bicarbonate normal.  6. CKD III: Renal function is at baseline of CKD. Will continue to monitor. Avoid NSAIDs and any nephrotoxin. Patient has not been adherent to dietary advise. He constantly ordered fast food to his room and indulged in lots of salty food. We discussed at length the benefit of adhering with Renal Diets and food with low sodium content.  Code Status: Full Code Family Communication: N/A Disposition Plan: Not yet ready for discharge  Kimmora Risenhoover  If 7PM-7AM, please contact night-coverage.  05/13/2017, 6:49 PM  LOS: 1 day

## 2017-05-13 NOTE — Progress Notes (Signed)
Pt with indigestion and given maalox. After taking maalox, pt immediately became nauseous and had episode of emesis into trash can of uncertain amount. Pt states, "that is just what I need after eating that hamburger." Pt verbalizing feeling much more comfort at this time and denies any further needs for the time being.

## 2017-05-13 NOTE — Progress Notes (Signed)
Critical Value Alert  Critical Value: Hemoglobin 6  Date & Time Notified: 05/13/2017 0455  Provider Notified: Silas [place]  Orders Received: Provider to review and address.

## 2017-05-14 DIAGNOSIS — D638 Anemia in other chronic diseases classified elsewhere: Secondary | ICD-10-CM

## 2017-05-14 DIAGNOSIS — R701 Abnormal plasma viscosity: Secondary | ICD-10-CM

## 2017-05-14 DIAGNOSIS — N183 Chronic kidney disease, stage 3 (moderate): Secondary | ICD-10-CM

## 2017-05-14 DIAGNOSIS — N049 Nephrotic syndrome with unspecified morphologic changes: Secondary | ICD-10-CM

## 2017-05-14 DIAGNOSIS — D5700 Hb-SS disease with crisis, unspecified: Principal | ICD-10-CM

## 2017-05-14 DIAGNOSIS — D72829 Elevated white blood cell count, unspecified: Secondary | ICD-10-CM

## 2017-05-14 LAB — TYPE AND SCREEN
ABO/RH(D): O POS
Antibody Screen: NEGATIVE
Donor AG Type: NEGATIVE
UNIT DIVISION: 0

## 2017-05-14 LAB — CBC WITH DIFFERENTIAL/PLATELET
Basophils Absolute: 0 10*3/uL (ref 0.0–0.1)
Basophils Relative: 0 %
Eosinophils Absolute: 0.8 10*3/uL — ABNORMAL HIGH (ref 0.0–0.7)
Eosinophils Relative: 5 %
HEMATOCRIT: 23.8 % — AB (ref 39.0–52.0)
HEMOGLOBIN: 7.7 g/dL — AB (ref 13.0–17.0)
LYMPHS ABS: 6 10*3/uL — AB (ref 0.7–4.0)
LYMPHS PCT: 36 %
MCH: 29.4 pg (ref 26.0–34.0)
MCHC: 32.4 g/dL (ref 30.0–36.0)
MCV: 90.8 fL (ref 78.0–100.0)
MONOS PCT: 8 %
Monocytes Absolute: 1.3 10*3/uL — ABNORMAL HIGH (ref 0.1–1.0)
NEUTROS PCT: 51 %
Neutro Abs: 8.6 10*3/uL — ABNORMAL HIGH (ref 1.7–7.7)
Platelets: 491 10*3/uL — ABNORMAL HIGH (ref 150–400)
RBC: 2.62 MIL/uL — AB (ref 4.22–5.81)
RDW: 19.1 % — AB (ref 11.5–15.5)
WBC: 16.7 10*3/uL — AB (ref 4.0–10.5)

## 2017-05-14 LAB — RETICULOCYTES
RBC.: 2.62 MIL/uL — AB (ref 4.22–5.81)
RETIC COUNT ABSOLUTE: 47.2 10*3/uL (ref 19.0–186.0)
Retic Ct Pct: 1.8 % (ref 0.4–3.1)

## 2017-05-14 LAB — BPAM RBC
Blood Product Expiration Date: 201904252359
ISSUE DATE / TIME: 201903310639
Unit Type and Rh: 5100

## 2017-05-14 LAB — HEPATIC FUNCTION PANEL
ALBUMIN: 1.3 g/dL — AB (ref 3.5–5.0)
ALK PHOS: 232 U/L — AB (ref 38–126)
ALT: 26 U/L (ref 17–63)
AST: 40 U/L (ref 15–41)
Bilirubin, Direct: 0.1 mg/dL (ref 0.1–0.5)
Indirect Bilirubin: 0.7 mg/dL (ref 0.3–0.9)
TOTAL PROTEIN: 4.9 g/dL — AB (ref 6.5–8.1)
Total Bilirubin: 0.8 mg/dL (ref 0.3–1.2)

## 2017-05-14 LAB — BASIC METABOLIC PANEL
ANION GAP: 7 (ref 5–15)
BUN: 17 mg/dL (ref 6–20)
CALCIUM: 7.9 mg/dL — AB (ref 8.9–10.3)
CO2: 21 mmol/L — ABNORMAL LOW (ref 22–32)
Chloride: 112 mmol/L — ABNORMAL HIGH (ref 101–111)
Creatinine, Ser: 2.41 mg/dL — ABNORMAL HIGH (ref 0.61–1.24)
GFR calc Af Amer: 39 mL/min — ABNORMAL LOW (ref >60)
GFR, EST NON AFRICAN AMERICAN: 33 mL/min — AB (ref >60)
GLUCOSE: 102 mg/dL — AB (ref 65–99)
POTASSIUM: 4.8 mmol/L (ref 3.5–5.1)
SODIUM: 140 mmol/L (ref 135–145)

## 2017-05-14 MED ORDER — HYDROMORPHONE 1 MG/ML IV SOLN
INTRAVENOUS | Status: DC
  Administered 2017-05-14: 11:00:00 via INTRAVENOUS
  Administered 2017-05-14: 6 mg via INTRAVENOUS
  Administered 2017-05-14: 3.5 mg via INTRAVENOUS
  Administered 2017-05-14: 6.5 mg via INTRAVENOUS
  Administered 2017-05-14: 1.5 mg via INTRAVENOUS
  Administered 2017-05-14: 5 mg via INTRAVENOUS
  Administered 2017-05-15: 05:00:00 via INTRAVENOUS
  Administered 2017-05-15: 4.48 mg via INTRAVENOUS
  Administered 2017-05-15: 9 mg via INTRAVENOUS
  Administered 2017-05-15: 4 mg via INTRAVENOUS
  Administered 2017-05-15 (×2): 2.5 mg via INTRAVENOUS
  Administered 2017-05-15: 3 mg via INTRAVENOUS
  Administered 2017-05-16: 05:00:00 via INTRAVENOUS
  Administered 2017-05-16: 4 mg via INTRAVENOUS
  Administered 2017-05-16: 4.5 mg via INTRAVENOUS
  Administered 2017-05-16: 5 mg via INTRAVENOUS
  Filled 2017-05-14 (×3): qty 25

## 2017-05-14 MED ORDER — FOLIC ACID 1 MG PO TABS
2.0000 mg | ORAL_TABLET | Freq: Every day | ORAL | Status: DC
  Administered 2017-05-15 – 2017-05-18 (×4): 2 mg via ORAL
  Filled 2017-05-14 (×4): qty 2

## 2017-05-14 MED ORDER — CAMPHOR-MENTHOL 0.5-0.5 % EX LOTN
TOPICAL_LOTION | CUTANEOUS | Status: DC | PRN
  Filled 2017-05-14: qty 222

## 2017-05-14 MED ORDER — PANTOPRAZOLE SODIUM 40 MG PO TBEC
40.0000 mg | DELAYED_RELEASE_TABLET | Freq: Every day | ORAL | Status: DC
  Administered 2017-05-17: 40 mg via ORAL
  Filled 2017-05-14 (×4): qty 1

## 2017-05-14 MED ORDER — PANTOPRAZOLE SODIUM 40 MG IV SOLR
40.0000 mg | Freq: Once | INTRAVENOUS | Status: AC
  Administered 2017-05-14: 40 mg via INTRAVENOUS
  Filled 2017-05-14: qty 40

## 2017-05-14 MED ORDER — SENNOSIDES-DOCUSATE SODIUM 8.6-50 MG PO TABS
1.0000 | ORAL_TABLET | Freq: Two times a day (BID) | ORAL | Status: DC
  Administered 2017-05-17: 1 via ORAL
  Filled 2017-05-14 (×5): qty 1

## 2017-05-14 MED ORDER — ENOXAPARIN SODIUM 40 MG/0.4ML ~~LOC~~ SOLN
40.0000 mg | SUBCUTANEOUS | Status: DC
  Administered 2017-05-14 – 2017-05-17 (×4): 40 mg via SUBCUTANEOUS
  Filled 2017-05-14 (×4): qty 0.4

## 2017-05-14 MED ORDER — FOLIC ACID 1 MG PO TABS
1.0000 mg | ORAL_TABLET | Freq: Every day | ORAL | Status: AC
  Administered 2017-05-14: 1 mg via ORAL
  Filled 2017-05-14: qty 1

## 2017-05-14 NOTE — Progress Notes (Signed)
Room air sats 96%

## 2017-05-14 NOTE — Progress Notes (Signed)
Pt refused AM labs

## 2017-05-14 NOTE — Progress Notes (Signed)
SICKLE CELL SERVICE PROGRESS NOTE  [name] [name]. XBD:[id number] DOB: 22-May-1980 DOA: 05/12/2017 PCP: [name], FNP  Assessment/Plan: Active Problems:   Chronic kidney disease   Sickle cell anemia with pain (HCC)   Sickle cell anemia with crisis ([place])  1. Hb SS with Crisis:Without any knowledge of his renal function, I am not inclined to increase any analgesics. We have discussed the option of decreasing bolus dose of Dilaudid to allow for more frequent use before falling asleep. He is requesting that the dose remain the same. However I have discussed with him that any worsening renal function would dictate a decreased dose on the PCA.  2. AoCKD III with Nephrotic Syndrome: Pt was diagnosed with ATN, ISF and FSGN due to SSD on recent admission after he underwent kidney biopsy. Since discharge he has not been seen by Nephrology but reports LE and scrotal swelling persists. At the time of discharge his Cr was 5.55. Currently I have no knowledge of his renal function as he has refused lab draws for the last 2 days. He has agreed to having labs drawn this afternoon and I will review once resulted.  3. Nephrotic Syndrome: Pt has persistent swelling of the LE's and scrotum. He is on a sodium restricted diet but states that he is unable to adhere given the limited choices and the taste of food. Will ask Nutritionist to see in consultation.  4. Anemia of Chronic Disease: He has a Hb well below his previous baseline which was at 9.8 g/dL 5 months ago. However he has had a progressive downward trend since then and his new baseline is about 6 g/dL. However he does have a reticulocytopenia which is likely a reflection of his steady state kidney function. I will re-check erythropoietin levels and likely treat with ESA and Folic Acid. Also check Iron studies and B-12 levels. 5. Leukocytosis: No evidence of infection. Leukocytosis is a feature of Sickle Cell Crisis.  6. Protein Malnutrition:  Nutrition consultation.      Code Status: Full Code Family Communication: N/A Disposition Plan: Not yet ready for discharge  [place].  Pager [phone]. If 7PM-7AM, please contact night-coverage.  05/14/2017, 5:21 PM  LOS: 2 days   Interim History: Pt reports that the Dilaudid is causing him to go to sleep without any appreciable decrease in pain. When he awakens, the pain has again re-escalated. I have discussed with patient that this dictates a decrease in dose so that he can use the PCA as intended to slowly decrease the pain. However he states that he feels fatigued and needs to sleep. As well, he is now agreeable to having labs drawn to evaluate renal function and to determine Hematological profile. He reports pain in arms, legs and back. He does not quantify the intensity. However he has used 28 mg of Dilaudid with 75/56:demands/deliveries in the last 24 hours.He states that he has scrotal swelling but is unwilling to allow an examination. I am however able to visualize swelling up to the thighs.     Consultants:  None  Procedures:  None  Antibiotics:  None    Objective: Vitals:   05/14/17 1021 05/14/17 1114 05/14/17 1433 05/14/17 1615  BP:  97/60  102/68  Pulse:  98  95  Resp: 13 14 12 12   Temp:  98.3 F (36.8 C)  98.4 F (36.9 C)  TempSrc:  Oral  Oral  SpO2: 100% 95% 99% 100%  Weight:      Height:  Weight change: 0.1 kg (3.5 oz)  Intake/Output Summary (Last 24 hours) at 05/14/2017 1721 Last data filed at 05/14/2017 1616 Gross per 24 hour  Intake 836.16 ml  Output 5150 ml  Net -4313.84 ml     Physical Exam General: Alert, awake, oriented x3, in no acute distress. Chronically ill appearing. HEENT: [place]/AT PEERL, EOMI, anicteric Neck: Trachea midline,  no masses, no thyromegal,y no JVD, no carotid bruit OROPHARYNX:  Moist, No exudate/ erythema/lesions.  Heart: Regular rate and rhythm. II/VI Non-radiating SEM at base.  No rubs or gallops, PMI  non-displaced, no heaves or thrills on palpation.  Lungs: Clear to auscultation, no wheezing or rhonchi noted. No increased vocal fremitus resonant to percussion  Abdomen: Soft, nontender, nondistended, positive bowel sounds, no masses no hepatosplenomegaly noted.  Neuro: No focal neurological deficits noted cranial nerves II through XII grossly intact. Strength functional in bilateral upper and lower extremities. Pt ambulating in halls without difficulty Musculoskeletal: No warmth swelling or erythema around joints, no spinal tenderness noted. However he does have 2+ edema throughout the LE's Psychiatric: Patient alert and oriented x3, good insight and cognition, good recent to remote recall. Lymph node survey: No cervical axillary or inguinal lymphadenopathy noted.    Data Reviewed: Basic Metabolic Panel: Recent Labs  Lab 05/11/17 1449 05/12/17 1000 05/13/17 0346  NA 142 139 136  K 3.7 3.3* 3.8  CL 110* 107 108  CO2 22 24 22   GLUCOSE 84 93 120*  BUN 16 16 14   CREATININE 2.38* 2.34* 2.26*  CALCIUM 7.4* 7.6* 7.5*   Liver Function Tests: Recent Labs  Lab 05/11/17 1449 05/12/17 1000 05/13/17 0346  AST 47* 45* 37  ALT 35 30 26  ALKPHOS 279* 229* 196*  BILITOT 0.6 0.8 0.4  PROT 4.5* 5.0* 4.3*  ALBUMIN 1.6* 1.4* 1.2*   No results for input(s): LIPASE, AMYLASE in the last 168 hours. No results for input(s): AMMONIA in the last 168 hours. CBC: Recent Labs  Lab 05/11/17 1449 05/12/17 1000 05/13/17 0346 05/14/17 1644  WBC 19.5* 18.4* 19.8* 16.7*  NEUTROABS 9.8* 10.4*  --  PENDING  HGB 4.9* 7.0* 6.0* 7.7*  HCT 15.3* 21.3* 18.3* 23.8*  MCV 92 91.4 92.0 90.8  PLT 805* 641* 552* 491*   Cardiac Enzymes: No results for input(s): CKTOTAL, CKMB, CKMBINDEX, TROPONINI in the last 168 hours. BNP (last 3 results) No results for input(s): BNP in the last 8760 hours.  ProBNP (last 3 results) No results for input(s): PROBNP in the last 8760 hours.  CBG: No results for  input(s): GLUCAP in the last 168 hours.  No results found for this or any previous visit (from the past 240 hour(s)).   Studies: Dg Chest 2 View  Result Date: 05/12/2017 CLINICAL DATA:  Acute chest pain today. Patient with sickle cell disease. EXAM: CHEST - 2 VIEW COMPARISON:  04/24/2017 and prior radiographs FINDINGS: UPPER limits normal heart size and increased pulmonary vascularity noted. Mild chronic streaky pulmonary opacities within the LOWER lungs noted. No acute airspace disease, pleural effusion, consolidation or mass noted. No acute bony abnormalities are identified. IMPRESSION: UPPER limits normal heart size without evidence of acute cardiopulmonary disease. Electronically Signed   By: Margarette Canada M.D.   On: 05/12/2017 11:38   US Abdomen Complete  Result Date: 04/25/2017 CLINICAL DATA:  Abdominal pain with ascites. EXAM: ABDOMEN ULTRASOUND COMPLETE DUPLEX LIVER ULTRASOUND TECHNIQUE: Grayscale and color ultrasound of the abdomen was performed. Color and duplex Doppler ultrasound was also performed to evaluate the  hepatic in-flow and out-flow vessels. COMPARISON:  CT abdomen pelvis dated April 19, 2017. FINDINGS: Gallbladder: Surgically absent. Common bile duct: Diameter: 3 mm, normal Liver: No focal lesion identified. Increased in parenchymal echogenicity. Portal vein is patent on color Doppler imaging with normal direction of blood flow towards the liver. IVC: No abnormality visualized. Pancreas: Visualized portion unremarkable. Spleen: Surgically absent. Right Kidney: Length: 10.4 cm. Echogenicity within normal limits. No mass or hydronephrosis visualized. Left Kidney: Length: 9.2 cm. Echogenicity within normal limits. No mass or hydronephrosis visualized. Abdominal aorta: No aneurysm visualized. Other findings: None. Portal Vein Velocities Main: 58.2 cm/sec, hepatopetal Right: 16.4 cm/sec, hepatopetal Left: 25.5 cm/sec, hepatopetal Hepatic Vein Velocities Right: 26.5 cm/sec, hepatofugal  Middle: 52.3 cm/sec, hepatofugal Left: 36.5 cm/sec, hepatofugal Hepatic Artery Velocity: 50.4 cm/sec Varices: Absent. Ascites: Trace perihepatic ascites. IMPRESSION: 1. Increased hepatic parenchymal echogenicity as can be seen with liver disease. No focal abnormality. Trace perihepatic ascites. 2. Normal velocities and direction of flow within the hepatic vessels. Electronically Signed   By: Titus Dubin M.D.   On: 04/25/2017 14:20   US Abdomen Limited  Result Date: 05/02/2017 CLINICAL DATA:  Left lower quadrant pain.  Sickle cell disease. EXAM: ULTRASOUND ABDOMEN LIMITED COMPARISON:  None. FINDINGS: Specific evaluation of the left lower quadrant was requested. There is no evidence of mass or focal fluid collection in the region. One could question mild edema the superficial subcutaneous tissues which is nonspecific. IMPRESSION: No left lower quadrant abnormality identified internally. One could question mild nonspecific edema the left lower quadrant subcutaneous fat. Electronically Signed   By: Nelson Chimes M.D.   On: 05/02/2017 09:55   US Abdomen Limited  Result Date: 04/23/2017 CLINICAL DATA:  Ascites seen on CT abdomen and pelvis 04/19/2017. Evaluate for paracentesis. EXAM: LIMITED ABDOMEN ULTRASOUND FOR ASCITES TECHNIQUE: Limited ultrasound survey for ascites was performed in all four abdominal quadrants. COMPARISON:  None. FINDINGS: Very little ascitic fluid is identified. The patient is not a candidate for paracentesis. IMPRESSION: As above. Electronically Signed   By: Inge Rise M.D.   On: 04/23/2017 16:05   Dg Chest Port 1 View  Result Date: 04/24/2017 CLINICAL DATA:  Central catheter placement EXAM: PORTABLE CHEST 1 VIEW COMPARISON:  April 07, 2017 FINDINGS: Central catheter tip is in the superior vena cava near the cavoatrial junction. No pneumothorax. There is patchy bibasilar lung atelectatic change. Lungs elsewhere are clear. Heart size and pulmonary vascularity are normal. No  adenopathy. There is avascular necrosis in the right humeral head. IMPRESSION: 1. Central catheter tip in superior vena cava near cavoatrial junction. No pneumothorax. 2. Bibasilar atelectasis, slightly more on the left than the right. Earliest changes of pneumonia cannot be excluded. 3.  Avascular necrosis right humeral head. Electronically Signed   By: Lowella Grip III M.D.   On: 04/24/2017 11:54   Ct Renal Stone Study  Result Date: 04/19/2017 CLINICAL DATA:  Flank pain. Stone disease suspected. Diarrhea and abdominal discomfort. Recent hospital admission for colitis. No improvement, now with weakness. EXAM: CT ABDOMEN AND PELVIS WITHOUT CONTRAST TECHNIQUE: Multidetector CT imaging of the abdomen and pelvis was performed following the standard protocol without IV contrast. COMPARISON:  CT 04/07/2017 FINDINGS: Lower chest: Bibasilar scarring with slight improved lung aeration from prior exam. Trace bilateral pleural thickening. Decreased density of the blood pool consistent with anemia. Hepatobiliary: No focal hepatic lesion. Clips in the gallbladder fossa postcholecystectomy. No biliary dilatation. Pancreas: No ductal dilatation. No definite peripancreatic fat stranding, however mild generalized edema of the  upper mesentery. Spleen: Tiny calcified spleen, sequela of sickle cell, unchanged. Adrenals/Urinary Tract: Normal adrenal glands. Bilateral renal parenchymal atrophy, unchanged from prior exam. There is prominence of the left greater than right renal collecting systems and proximal ureter without calcified stone. Urinary bladder is distended without wall thickening. No calcified urolithiasis. Stomach/Bowel: Persistent and slight worsening colonic wall thickening of the ascending and transverse colon, now with involvement of the mid and proximal descending colon. No evidence of perforation. Possible wall thickening of the duodenum, suboptimally assessed without enteric contrast and presence of  intra-abdominal ascites. No other small bowel inflammation. Normal appendix. Vascular/Lymphatic: Innumerable central mesenteric nodes that are not enlarged by size criteria. Decreased blood pool density consistent with anemia. Reproductive: Prostate is unremarkable. Other: Increased abdominopelvic ascites from prior, small to moderate. Increased mesenteric edema. No free air. No evidence of intra-abdominal abscess. Musculoskeletal: Diffuse osteosclerosis consistent with history of sickle cell. Multiple small endplate depressions throughout spine. No acute finding. IMPRESSION: 1. Persistent/worsening colitis with colonic wall thickening of the ascending and transverse colon, new involvement of the descending colon. Possible new wall thickening of the duodenum. Findings may be infectious or inflammatory. 2. Increased abdominopelvic ascites. 3. Prominence of bilateral renal collecting systems without urolithiasis. This may be secondary to urinary bladder distention. Electronically Signed   By: Jeb Levering M.D.   On: 04/19/2017 23:43   US Biopsy (kidney)  Result Date: 04/25/2017 CLINICAL DATA:  Six anemia, acute on chronic kidney disease and nephrotic syndrome. Renal biopsy requested. EXAM: ULTRASOUND GUIDED CORE BIOPSY OF LEFT KIDNEY MEDICATIONS: 2.0 mg IV Versed; 150 mcg IV Fentanyl Total Moderate Sedation Time: 16 minutes. The patient's level of consciousness and physiologic status were continuously monitored during the procedure by Radiology nursing. PROCEDURE: The procedure, risks, benefits, and alternatives were explained to the patient. Questions regarding the procedure were encouraged and answered. The patient understands and consents to the procedure. A time out was performed prior to initiating the procedure. Both kidneys were examined by ultrasound. The left flank region was prepped with chlorhexidine in a sterile fashion, and a sterile drape was applied covering the operative field. A sterile gown  and sterile gloves were used for the procedure. Local anesthesia was provided with 1% Lidocaine. Samples of the left lower pole renal cortex were obtained with a 16 gauge core device. Three passes with the core biopsy device were performed and samples sent in saline. COMPLICATIONS: None. FINDINGS: Both kidneys are well visualized by ultrasound. The left kidney was chosen as it is slightly more superficial in location from a posterior approach and also demonstrated slightly thicker renal cortex compared to the right. The first core biopsy sample was short in length. Two additional samples were therefore obtained in order to have a sufficient amount of tissue for pathologic analysis. IMPRESSION: Ultrasound-guided core biopsy performed at the level of left lower pole renal cortex. Electronically Signed   By: Aletta Edouard M.D.   On: 04/25/2017 13:18   US Abdominal Pelvic Art/vent Flow Doppler  Result Date: 04/25/2017 CLINICAL DATA:  Abdominal pain with ascites. EXAM: ABDOMEN ULTRASOUND COMPLETE DUPLEX LIVER ULTRASOUND TECHNIQUE: Grayscale and color ultrasound of the abdomen was performed. Color and duplex Doppler ultrasound was also performed to evaluate the hepatic in-flow and out-flow vessels. COMPARISON:  CT abdomen pelvis dated April 19, 2017. FINDINGS: Gallbladder: Surgically absent. Common bile duct: Diameter: 3 mm, normal Liver: No focal lesion identified. Increased in parenchymal echogenicity. Portal vein is patent on color Doppler imaging with normal direction of blood  flow towards the liver. IVC: No abnormality visualized. Pancreas: Visualized portion unremarkable. Spleen: Surgically absent. Right Kidney: Length: 10.4 cm. Echogenicity within normal limits. No mass or hydronephrosis visualized. Left Kidney: Length: 9.2 cm. Echogenicity within normal limits. No mass or hydronephrosis visualized. Abdominal aorta: No aneurysm visualized. Other findings: None. Portal Vein Velocities Main: 58.2 cm/sec,  hepatopetal Right: 16.4 cm/sec, hepatopetal Left: 25.5 cm/sec, hepatopetal Hepatic Vein Velocities Right: 26.5 cm/sec, hepatofugal Middle: 52.3 cm/sec, hepatofugal Left: 36.5 cm/sec, hepatofugal Hepatic Artery Velocity: 50.4 cm/sec Varices: Absent. Ascites: Trace perihepatic ascites. IMPRESSION: 1. Increased hepatic parenchymal echogenicity as can be seen with liver disease. No focal abnormality. Trace perihepatic ascites. 2. Normal velocities and direction of flow within the hepatic vessels. Electronically Signed   By: Titus Dubin M.D.   On: 04/25/2017 14:20    Scheduled Meds: . enoxaparin (LOVENOX) injection  40 mg Subcutaneous Q24H  . folic acid  1 mg Oral Daily  . HYDROmorphone   Intravenous Q4H  . [START ON 05/15/2017] pantoprazole  40 mg Oral Q0600  . pantoprazole (PROTONIX) IV  40 mg Intravenous Once  . senna-docusate  1 tablet Oral BID   Continuous Infusions: . dextrose 5 % and 0.45% NaCl 10 mL/hr at 05/14/17 1616    Active Problems:   Chronic kidney disease   Sickle cell anemia with pain (HCC)   Sickle cell anemia with crisis ([place])    In excess of 50 minutes spent during this visit. Greater than 50% involved face to face contact with the patient for assessment, counseling and coordination of care.

## 2017-05-15 ENCOUNTER — Inpatient Hospital Stay (HOSPITAL_COMMUNITY): Payer: Medicare Other

## 2017-05-15 DIAGNOSIS — Z452 Encounter for adjustment and management of vascular access device: Secondary | ICD-10-CM

## 2017-05-15 DIAGNOSIS — N041 Nephrotic syndrome with focal and segmental glomerular lesions: Secondary | ICD-10-CM

## 2017-05-15 LAB — VITAMIN B12: Vitamin B-12: 196 pg/mL (ref 180–914)

## 2017-05-15 LAB — 737588 9+OXYCODONE+CRT-UNBUND
Amphetamine Scrn, Ur: NEGATIVE ng/mL
BARBITURATE SCREEN URINE: NEGATIVE ng/mL
BENZODIAZEPINE SCREEN, URINE: NEGATIVE ng/mL
CANNABINOIDS UR QL SCN: NEGATIVE ng/mL
Cocaine (Metab) Scrn, Ur: NEGATIVE ng/mL
Creatinine(Crt), U: 34.7 mg/dL (ref 20.0–300.0)
Methadone Screen, Urine: NEGATIVE ng/mL
Opiate Scrn, Ur: NEGATIVE ng/mL
PH UR, DRUG SCRN: 8.1 (ref 4.5–8.9)
Phencyclidine Qn, Ur: NEGATIVE ng/mL
Propoxyphene Scrn, Ur: NEGATIVE ng/mL

## 2017-05-15 LAB — OXYCODONE/OXYMORPHONE CONFIRM
OXYCODONE/OXYMORPH: POSITIVE — AB
OXYCODONE: NEGATIVE
OXYMORPHONE CONFIRM: 627 ng/mL
OXYMORPHONE: POSITIVE — AB

## 2017-05-15 LAB — IRON AND TIBC
Iron: 43 ug/dL — ABNORMAL LOW (ref 45–182)
Saturation Ratios: 36 % (ref 17.9–39.5)
TIBC: 120 ug/dL — AB (ref 250–450)
UIBC: 77 ug/dL

## 2017-05-15 LAB — FOLATE: Folate: 38.2 ng/mL (ref >5.9)

## 2017-05-15 LAB — FERRITIN: Ferritin: 2503 ng/mL — ABNORMAL HIGH (ref 24–336)

## 2017-05-15 MED ORDER — DILTIAZEM HCL 30 MG PO TABS
30.0000 mg | ORAL_TABLET | Freq: Two times a day (BID) | ORAL | Status: DC
  Administered 2017-05-15 – 2017-05-18 (×6): 30 mg via ORAL
  Filled 2017-05-15 (×6): qty 1

## 2017-05-15 MED ORDER — DARBEPOETIN ALFA 25 MCG/0.42ML IJ SOSY
25.0000 ug | PREFILLED_SYRINGE | Freq: Once | INTRAMUSCULAR | Status: AC
  Administered 2017-05-15: 25 ug via SUBCUTANEOUS
  Filled 2017-05-15: qty 0.42

## 2017-05-15 NOTE — Care Management Important Message (Signed)
Important Message  Patient Details  Name: [name] [name]. MRN: [id number] Date of Birth: [birth year]   Medicare Important Message Given:  Yes    Kerin Salen 05/15/2017, 10:52 AMImportant Message  Patient Details  Name: [name] [name]. MRN: [id number] Date of Birth: 05/13/1980   Medicare Important Message Given:  Yes    Kerin Salen 05/15/2017, 10:52 AM

## 2017-05-15 NOTE — Procedures (Signed)
Central Venous Catheter Insertion Procedure Note Henery Betzold [id number] 1980/02/15  Procedure: Insertion of Central Venous Catheter Indications: Drug and/or fluid administration and Frequent blood sampling  Procedure Details Consent: Risks of procedure as well as the alternatives and risks of each were explained to the (patient/caregiver).  Consent for procedure obtained. Time Out: Verified patient identification, verified procedure, site/side was marked, verified correct patient position, special equipment/implants available, medications/allergies/relevent history reviewed, required imaging and test results available.  Performed Real time [place] used to ID and cannulate vessel   Maximum sterile technique was used including antiseptics, cap, gloves, gown, hand hygiene, mask and sheet. Skin prep: Chlorhexidine; local anesthetic administered A antimicrobial bonded/coated triple lumen catheter was placed in the right internal jugular vein using the Seldinger technique.  Evaluation Blood flow good Complications: No apparent complications Patient did tolerate procedure well. Chest X-ray ordered to verify placement.  CXR: pending.  Erick Colace ACNP-BC [facility] Pager # [phone] OR # [phone] if no answer  Clementeen Graham 05/15/2017, 10:27 AM

## 2017-05-15 NOTE — Progress Notes (Signed)
SICKLE CELL SERVICE PROGRESS NOTE  [name] [name]. HEN:[id number] DOB: 1980-09-23 DOA: 05/12/2017 PCP: [name], FNP  Assessment/Plan: Active Problems:   Chronic kidney disease   Sickle cell anemia with pain (HCC)   Sickle cell anemia with crisis ([place])   Encounter for central line placement  1. Hb SS with Crisis: Will schedule oxycodone and continue PCA at a decreased bolus dose. He is unable to receive Toradol due to CKD. 2. CKD III with Nephrotic Syndrome: Renal function improved since last hospitalization but stable since admission. He has significant nephrotic syndrome with protein wasting. I have discussed with Nephrology who agrees that Cardizem would be a reasonable choice and that ACE-I or ARB's should be avoided. Will start on Cardizem 30 mg q 12 hours and monitor BP to ensure no adverse effects of hypotension.  3. Nephrotic Syndrome: Swelling and weight decreased today. However he is intravascularly depleted. I have started on Cardizem 30 mg q 12 hours.  4. Anemia of Chronic Disease: Hb from labs last night was 7.7 g/dL which I s below his previous baseline which was at 9.8 g/dL 5 months ago.  He also has a  reticulocytopenia which is likely a reflection of his steady state kidney function. I will give a dose of ESA (Aranesp 0.45 mg/kg) and continue Folic Acid and T-61 levels supplementation. 5. Leukocytosis: Improved. No evidence of infection. Leukocytosis is a feature of Sickle Cell Crisis.  6. Protein Malnutrition: Nutrition consultation.      Code Status: Full Code Family Communication: Updated patients mother at patients request Disposition Plan: Not yet ready for discharge  [place].  Pager [phone]. If 7PM-7AM, please contact night-coverage.  05/15/2017, 3:57 PM  LOS: 3 days   Interim History: Pt reports that he feels better today. IJ line was placed this morning. He still c/o pain in legs and back. He is receptive to changes in clinical plan to  decrease PCA and schedule Oxycodone. Also receptive to Cardizem and Aranesp.   Consultants:  None  Procedures:  None  Antibiotics:  None    Objective: Vitals:   05/15/17 1030 05/15/17 1155 05/15/17 1412 05/15/17 1552  BP: 108/76  110/73   Pulse: 100  98   Resp: 16 16 14 14   Temp: 98.8 F (37.1 C)  98.4 F (36.9 C)   TempSrc: Oral  Oral   SpO2: 100% 100% 99% 100%  Weight:      Height:       Weight change: -1.28 kg (-2 lb 13.1 oz)  Intake/Output Summary (Last 24 hours) at 05/15/2017 1557 Last data filed at 05/15/2017 1540 Gross per 24 hour  Intake 1196.84 ml  Output 5150 ml  Net -3953.16 ml     Physical Exam General: Alert, awake, oriented x3, in no acute distress. Chronically ill appearing. HEENT: [place]/AT PEERL, EOMI, anicteric Neck: Trachea midline,  no masses, no thyromegal,y no JVD, no carotid bruit OROPHARYNX:  Moist, No exudate/ erythema/lesions.  Heart: Regular rate and rhythm. II/VI Non-radiating SEM at base.  No rubs or gallops, PMI non-displaced, no heaves or thrills on palpation.  Lungs: Clear to auscultation, no wheezing or rhonchi noted. No increased vocal fremitus resonant to percussion  Abdomen: Soft, nontender, nondistended, positive bowel sounds, no masses no hepatosplenomegaly noted.  Neuro: No focal neurological deficits noted cranial nerves II through XII grossly intact. Strength functional in bilateral upper and lower extremities. Pt ambulating in halls without difficulty Musculoskeletal: No warmth swelling or erythema around joints, no spinal tenderness  noted. Edema decreased to 1+ in LE's and only trace in genital area. Psychiatric: Patient alert and oriented x3, good insight and cognition, good recent to remote recall.     Data Reviewed: Basic Metabolic Panel: Recent Labs  Lab 05/11/17 1449 05/12/17 1000 05/13/17 0346 05/14/17 1644  NA 142 139 136 140  K 3.7 3.3* 3.8 4.8  CL 110* 107 108 112*  CO2 22 24 22  21*  GLUCOSE 84 93 120*  102*  BUN 16 16 14 17   CREATININE 2.38* 2.34* 2.26* 2.41*  CALCIUM 7.4* 7.6* 7.5* 7.9*   Liver Function Tests: Recent Labs  Lab 05/11/17 1449 05/12/17 1000 05/13/17 0346 05/14/17 1644  AST 47* 45* 37 40  ALT 35 30 26 26   ALKPHOS 279* 229* 196* 232*  BILITOT 0.6 0.8 0.4 0.8  PROT 4.5* 5.0* 4.3* 4.9*  ALBUMIN 1.6* 1.4* 1.2* 1.3*   No results for input(s): LIPASE, AMYLASE in the last 168 hours. No results for input(s): AMMONIA in the last 168 hours. CBC: Recent Labs  Lab 05/11/17 1449 05/12/17 1000 05/13/17 0346 05/14/17 1644  WBC 19.5* 18.4* 19.8* 16.7*  NEUTROABS 9.8* 10.4*  --  8.6*  HGB 4.9* 7.0* 6.0* 7.7*  HCT 15.3* 21.3* 18.3* 23.8*  MCV 92 91.4 92.0 90.8  PLT 805* 641* 552* 491*   Cardiac Enzymes: No results for input(s): CKTOTAL, CKMB, CKMBINDEX, TROPONINI in the last 168 hours. BNP (last 3 results) No results for input(s): BNP in the last 8760 hours.  ProBNP (last 3 results) No results for input(s): PROBNP in the last 8760 hours.  CBG: No results for input(s): GLUCAP in the last 168 hours.  No results found for this or any previous visit (from the past 240 hour(s)).   Studies: Dg Chest 2 View  Result Date: 05/12/2017 CLINICAL DATA:  Acute chest pain today. Patient with sickle cell disease. EXAM: CHEST - 2 VIEW COMPARISON:  04/24/2017 and prior radiographs FINDINGS: UPPER limits normal heart size and increased pulmonary vascularity noted. Mild chronic streaky pulmonary opacities within the LOWER lungs noted. No acute airspace disease, pleural effusion, consolidation or mass noted. No acute bony abnormalities are identified. IMPRESSION: UPPER limits normal heart size without evidence of acute cardiopulmonary disease. Electronically Signed   By: Margarette Canada M.D.   On: 05/12/2017 11:38   US Abdomen Complete  Result Date: 04/25/2017 CLINICAL DATA:  Abdominal pain with ascites. EXAM: ABDOMEN ULTRASOUND COMPLETE DUPLEX LIVER ULTRASOUND TECHNIQUE: Grayscale and  color ultrasound of the abdomen was performed. Color and duplex Doppler ultrasound was also performed to evaluate the hepatic in-flow and out-flow vessels. COMPARISON:  CT abdomen pelvis dated April 19, 2017. FINDINGS: Gallbladder: Surgically absent. Common bile duct: Diameter: 3 mm, normal Liver: No focal lesion identified. Increased in parenchymal echogenicity. Portal vein is patent on color Doppler imaging with normal direction of blood flow towards the liver. IVC: No abnormality visualized. Pancreas: Visualized portion unremarkable. Spleen: Surgically absent. Right Kidney: Length: 10.4 cm. Echogenicity within normal limits. No mass or hydronephrosis visualized. Left Kidney: Length: 9.2 cm. Echogenicity within normal limits. No mass or hydronephrosis visualized. Abdominal aorta: No aneurysm visualized. Other findings: None. Portal Vein Velocities Main: 58.2 cm/sec, hepatopetal Right: 16.4 cm/sec, hepatopetal Left: 25.5 cm/sec, hepatopetal Hepatic Vein Velocities Right: 26.5 cm/sec, hepatofugal Middle: 52.3 cm/sec, hepatofugal Left: 36.5 cm/sec, hepatofugal Hepatic Artery Velocity: 50.4 cm/sec Varices: Absent. Ascites: Trace perihepatic ascites. IMPRESSION: 1. Increased hepatic parenchymal echogenicity as can be seen with liver disease. No focal abnormality. Trace perihepatic ascites. 2. Normal  velocities and direction of flow within the hepatic vessels. Electronically Signed   By: Titus Dubin M.D.   On: 04/25/2017 14:20   US Abdomen Limited  Result Date: 05/02/2017 CLINICAL DATA:  Left lower quadrant pain.  Sickle cell disease. EXAM: ULTRASOUND ABDOMEN LIMITED COMPARISON:  None. FINDINGS: Specific evaluation of the left lower quadrant was requested. There is no evidence of mass or focal fluid collection in the region. One could question mild edema the superficial subcutaneous tissues which is nonspecific. IMPRESSION: No left lower quadrant abnormality identified internally. One could question mild  nonspecific edema the left lower quadrant subcutaneous fat. Electronically Signed   By: Nelson Chimes M.D.   On: 05/02/2017 09:55   US Abdomen Limited  Result Date: 04/23/2017 CLINICAL DATA:  Ascites seen on CT abdomen and pelvis 04/19/2017. Evaluate for paracentesis. EXAM: LIMITED ABDOMEN ULTRASOUND FOR ASCITES TECHNIQUE: Limited ultrasound survey for ascites was performed in all four abdominal quadrants. COMPARISON:  None. FINDINGS: Very little ascitic fluid is identified. The patient is not a candidate for paracentesis. IMPRESSION: As above. Electronically Signed   By: Inge Rise M.D.   On: 04/23/2017 16:05   Dg Chest Port 1 View  Result Date: 05/15/2017 CLINICAL DATA:  Central line placement EXAM: PORTABLE CHEST 1 VIEW COMPARISON:  Chest x-ray of 05/12/2017 FINDINGS: Right central venous line is present with tip overlying the lower SVC near the expected right atrial junction. Linear atelectasis or scarring remains at the left lung base. No pneumonia or effusion is seen. Mediastinal and hilar contours are stable and the heart size is stable. There are degenerative changes noted in the right shoulder which may indicate avascular necrosis of the right humeral head. IMPRESSION: 1. Right central line placement tip overlies the lower SVC just above the expected right atrial junction. No pneumothorax. 2. Linear atelectasis or scarring at the left lung base. 3. Abnormality the right humeral head.  Question AVN. Electronically Signed   By: Ivar Drape M.D.   On: 05/15/2017 11:32   Dg Chest Port 1 View  Result Date: 04/24/2017 CLINICAL DATA:  Central catheter placement EXAM: PORTABLE CHEST 1 VIEW COMPARISON:  April 07, 2017 FINDINGS: Central catheter tip is in the superior vena cava near the cavoatrial junction. No pneumothorax. There is patchy bibasilar lung atelectatic change. Lungs elsewhere are clear. Heart size and pulmonary vascularity are normal. No adenopathy. There is avascular necrosis in the  right humeral head. IMPRESSION: 1. Central catheter tip in superior vena cava near cavoatrial junction. No pneumothorax. 2. Bibasilar atelectasis, slightly more on the left than the right. Earliest changes of pneumonia cannot be excluded. 3.  Avascular necrosis right humeral head. Electronically Signed   By: Lowella Grip III M.D.   On: 04/24/2017 11:54   Ct Renal Stone Study  Result Date: 04/19/2017 CLINICAL DATA:  Flank pain. Stone disease suspected. Diarrhea and abdominal discomfort. Recent hospital admission for colitis. No improvement, now with weakness. EXAM: CT ABDOMEN AND PELVIS WITHOUT CONTRAST TECHNIQUE: Multidetector CT imaging of the abdomen and pelvis was performed following the standard protocol without IV contrast. COMPARISON:  CT 04/07/2017 FINDINGS: Lower chest: Bibasilar scarring with slight improved lung aeration from prior exam. Trace bilateral pleural thickening. Decreased density of the blood pool consistent with anemia. Hepatobiliary: No focal hepatic lesion. Clips in the gallbladder fossa postcholecystectomy. No biliary dilatation. Pancreas: No ductal dilatation. No definite peripancreatic fat stranding, however mild generalized edema of the upper mesentery. Spleen: Tiny calcified spleen, sequela of sickle cell, unchanged. Adrenals/Urinary Tract:  Normal adrenal glands. Bilateral renal parenchymal atrophy, unchanged from prior exam. There is prominence of the left greater than right renal collecting systems and proximal ureter without calcified stone. Urinary bladder is distended without wall thickening. No calcified urolithiasis. Stomach/Bowel: Persistent and slight worsening colonic wall thickening of the ascending and transverse colon, now with involvement of the mid and proximal descending colon. No evidence of perforation. Possible wall thickening of the duodenum, suboptimally assessed without enteric contrast and presence of intra-abdominal ascites. No other small bowel  inflammation. Normal appendix. Vascular/Lymphatic: Innumerable central mesenteric nodes that are not enlarged by size criteria. Decreased blood pool density consistent with anemia. Reproductive: Prostate is unremarkable. Other: Increased abdominopelvic ascites from prior, small to moderate. Increased mesenteric edema. No free air. No evidence of intra-abdominal abscess. Musculoskeletal: Diffuse osteosclerosis consistent with history of sickle cell. Multiple small endplate depressions throughout spine. No acute finding. IMPRESSION: 1. Persistent/worsening colitis with colonic wall thickening of the ascending and transverse colon, new involvement of the descending colon. Possible new wall thickening of the duodenum. Findings may be infectious or inflammatory. 2. Increased abdominopelvic ascites. 3. Prominence of bilateral renal collecting systems without urolithiasis. This may be secondary to urinary bladder distention. Electronically Signed   By: Jeb Levering M.D.   On: 04/19/2017 23:43   US Biopsy (kidney)  Result Date: 04/25/2017 CLINICAL DATA:  Six anemia, acute on chronic kidney disease and nephrotic syndrome. Renal biopsy requested. EXAM: ULTRASOUND GUIDED CORE BIOPSY OF LEFT KIDNEY MEDICATIONS: 2.0 mg IV Versed; 150 mcg IV Fentanyl Total Moderate Sedation Time: 16 minutes. The patient's level of consciousness and physiologic status were continuously monitored during the procedure by Radiology nursing. PROCEDURE: The procedure, risks, benefits, and alternatives were explained to the patient. Questions regarding the procedure were encouraged and answered. The patient understands and consents to the procedure. A time out was performed prior to initiating the procedure. Both kidneys were examined by ultrasound. The left flank region was prepped with chlorhexidine in a sterile fashion, and a sterile drape was applied covering the operative field. A sterile gown and sterile gloves were used for the procedure.  Local anesthesia was provided with 1% Lidocaine. Samples of the left lower pole renal cortex were obtained with a 16 gauge core device. Three passes with the core biopsy device were performed and samples sent in saline. COMPLICATIONS: None. FINDINGS: Both kidneys are well visualized by ultrasound. The left kidney was chosen as it is slightly more superficial in location from a posterior approach and also demonstrated slightly thicker renal cortex compared to the right. The first core biopsy sample was short in length. Two additional samples were therefore obtained in order to have a sufficient amount of tissue for pathologic analysis. IMPRESSION: Ultrasound-guided core biopsy performed at the level of left lower pole renal cortex. Electronically Signed   By: Aletta Edouard M.D.   On: 04/25/2017 13:18   US Abdominal Pelvic Art/vent Flow Doppler  Result Date: 04/25/2017 CLINICAL DATA:  Abdominal pain with ascites. EXAM: ABDOMEN ULTRASOUND COMPLETE DUPLEX LIVER ULTRASOUND TECHNIQUE: Grayscale and color ultrasound of the abdomen was performed. Color and duplex Doppler ultrasound was also performed to evaluate the hepatic in-flow and out-flow vessels. COMPARISON:  CT abdomen pelvis dated April 19, 2017. FINDINGS: Gallbladder: Surgically absent. Common bile duct: Diameter: 3 mm, normal Liver: No focal lesion identified. Increased in parenchymal echogenicity. Portal vein is patent on color Doppler imaging with normal direction of blood flow towards the liver. IVC: No abnormality visualized. Pancreas: Visualized portion unremarkable. Spleen:  Surgically absent. Right Kidney: Length: 10.4 cm. Echogenicity within normal limits. No mass or hydronephrosis visualized. Left Kidney: Length: 9.2 cm. Echogenicity within normal limits. No mass or hydronephrosis visualized. Abdominal aorta: No aneurysm visualized. Other findings: None. Portal Vein Velocities Main: 58.2 cm/sec, hepatopetal Right: 16.4 cm/sec, hepatopetal Left: 25.5  cm/sec, hepatopetal Hepatic Vein Velocities Right: 26.5 cm/sec, hepatofugal Middle: 52.3 cm/sec, hepatofugal Left: 36.5 cm/sec, hepatofugal Hepatic Artery Velocity: 50.4 cm/sec Varices: Absent. Ascites: Trace perihepatic ascites. IMPRESSION: 1. Increased hepatic parenchymal echogenicity as can be seen with liver disease. No focal abnormality. Trace perihepatic ascites. 2. Normal velocities and direction of flow within the hepatic vessels. Electronically Signed   By: Titus Dubin M.D.   On: 04/25/2017 14:20    Scheduled Meds: . enoxaparin (LOVENOX) injection  40 mg Subcutaneous Q24H  . folic acid  2 mg Oral Daily  . HYDROmorphone   Intravenous Q4H  . pantoprazole  40 mg Oral Q0600  . senna-docusate  1 tablet Oral BID   Continuous Infusions: . dextrose 5 % and 0.45% NaCl 100 mL/hr at 05/15/17 1155    Active Problems:   Chronic kidney disease   Sickle cell anemia with pain (HCC)   Sickle cell anemia with crisis ([place])   Encounter for central line placement    In excess of 40 minutes spent during this visit. Greater than 50% involved face to face contact with the patient for assessment, counseling and coordination of care.

## 2017-05-16 DIAGNOSIS — E538 Deficiency of other specified B group vitamins: Secondary | ICD-10-CM

## 2017-05-16 LAB — BASIC METABOLIC PANEL
Anion gap: 6 (ref 5–15)
BUN: 23 mg/dL — ABNORMAL HIGH (ref 6–20)
CO2: 20 mmol/L — AB (ref 22–32)
CREATININE: 2.22 mg/dL — AB (ref 0.61–1.24)
Calcium: 7.8 mg/dL — ABNORMAL LOW (ref 8.9–10.3)
Chloride: 114 mmol/L — ABNORMAL HIGH (ref 101–111)
GFR calc non Af Amer: 37 mL/min — ABNORMAL LOW (ref >60)
GFR, EST AFRICAN AMERICAN: 43 mL/min — AB (ref >60)
Glucose, Bld: 101 mg/dL — ABNORMAL HIGH (ref 65–99)
Potassium: 4.8 mmol/L (ref 3.5–5.1)
Sodium: 140 mmol/L (ref 135–145)

## 2017-05-16 LAB — CBC WITH DIFFERENTIAL/PLATELET
Basophils Absolute: 0 10*3/uL (ref 0.0–0.1)
Basophils Relative: 0 %
EOS ABS: 0.9 10*3/uL — AB (ref 0.0–0.7)
Eosinophils Relative: 6 %
HEMATOCRIT: 22.2 % — AB (ref 39.0–52.0)
HEMOGLOBIN: 7.2 g/dL — AB (ref 13.0–17.0)
LYMPHS ABS: 4.7 10*3/uL — AB (ref 0.7–4.0)
Lymphocytes Relative: 31 %
MCH: 29.6 pg (ref 26.0–34.0)
MCHC: 32.4 g/dL (ref 30.0–36.0)
MCV: 91.4 fL (ref 78.0–100.0)
MONOS PCT: 9 %
Monocytes Absolute: 1.4 10*3/uL — ABNORMAL HIGH (ref 0.1–1.0)
NEUTROS ABS: 8.1 10*3/uL — AB (ref 1.7–7.7)
NEUTROS PCT: 54 %
Platelets: 474 10*3/uL — ABNORMAL HIGH (ref 150–400)
RBC: 2.43 MIL/uL — ABNORMAL LOW (ref 4.22–5.81)
RDW: 19 % — ABNORMAL HIGH (ref 11.5–15.5)
WBC: 15.2 10*3/uL — ABNORMAL HIGH (ref 4.0–10.5)

## 2017-05-16 LAB — RETICULOCYTES
RBC.: 2.43 MIL/uL — ABNORMAL LOW (ref 4.22–5.81)
Retic Count, Absolute: 41.3 10*3/uL (ref 19.0–186.0)
Retic Ct Pct: 1.7 % (ref 0.4–3.1)

## 2017-05-16 MED ORDER — OXYCODONE HCL 5 MG PO TABS
10.0000 mg | ORAL_TABLET | Freq: Four times a day (QID) | ORAL | Status: DC | PRN
  Administered 2017-05-16 (×2): 10 mg via ORAL
  Filled 2017-05-16 (×2): qty 2

## 2017-05-16 MED ORDER — VITAMIN D (ERGOCALCIFEROL) 1.25 MG (50000 UNIT) PO CAPS
50000.0000 [IU] | ORAL_CAPSULE | ORAL | Status: DC
Start: 2017-05-16 — End: 2017-05-18
  Administered 2017-05-16: 50000 [IU] via ORAL
  Filled 2017-05-16: qty 1

## 2017-05-16 MED ORDER — SODIUM BICARBONATE 650 MG PO TABS
650.0000 mg | ORAL_TABLET | Freq: Three times a day (TID) | ORAL | Status: DC
  Administered 2017-05-16 – 2017-05-18 (×7): 650 mg via ORAL
  Filled 2017-05-16 (×7): qty 1

## 2017-05-16 MED ORDER — HYDROMORPHONE 1 MG/ML IV SOLN
INTRAVENOUS | Status: DC
Start: 2017-05-16 — End: 2017-05-18
  Administered 2017-05-16: 6 mg via INTRAVENOUS
  Administered 2017-05-16: 0.8 mg via INTRAVENOUS
  Administered 2017-05-16: 2.6 mg via INTRAVENOUS
  Administered 2017-05-16: 4 mg via INTRAVENOUS
  Administered 2017-05-17: 06:00:00 via INTRAVENOUS
  Administered 2017-05-17: 4 mg via INTRAVENOUS
  Administered 2017-05-17: 5.2 mg via INTRAVENOUS
  Administered 2017-05-17: 4 mg via INTRAVENOUS
  Administered 2017-05-17: 3.6 mg via INTRAVENOUS
  Administered 2017-05-17: 4.8 mg via INTRAVENOUS
  Administered 2017-05-17: 4.4 mg via INTRAVENOUS
  Administered 2017-05-18: 2.4 mg via INTRAVENOUS
  Administered 2017-05-18: 3.6 mg via INTRAVENOUS
  Administered 2017-05-18: 03:00:00 via INTRAVENOUS
  Filled 2017-05-16 (×2): qty 25

## 2017-05-16 MED ORDER — HYDROCODONE-ACETAMINOPHEN 5-325 MG PO TABS
1.0000 | ORAL_TABLET | Freq: Once | ORAL | Status: AC
  Administered 2017-05-16: 2 via ORAL
  Filled 2017-05-16: qty 2

## 2017-05-16 NOTE — Progress Notes (Signed)
Patient Refused 2am vitals, RN made aware.

## 2017-05-16 NOTE — Progress Notes (Signed)
Nutrition Brief Note  RD consulted for "Nephrotic Syndrome."   Wt Readings from Last 15 Encounters:  05/15/17 120 lb 13 oz (54.8 kg)  05/11/17 123 lb (55.8 kg)  05/02/17 121 lb 11.1 oz (55.2 kg)  04/07/17 125 lb (56.7 kg)  02/27/17 120 lb (54.4 kg)  12/15/16 117 lb 11.2 oz (53.4 kg)  10/16/16 135 lb 9.6 oz (61.5 kg)  08/01/16 135 lb (61.2 kg)  02/27/16 118 lb 9.7 oz (53.8 kg)  12/03/15 135 lb (61.2 kg)  09/20/15 135 lb (61.2 kg)  03/11/15 129 lb 15.7 oz (59 kg)  11/23/14 127 lb 6.4 oz (57.8 kg)  08/17/14 123 lb (55.8 kg)  05/12/14 129 lb 6.4 oz (58.7 kg)   Patient with PMH significant for sickle cell anemia and CKD II. Presents this admission with Hb SS with crisis. Pt refused to speak with RD upon initial visit. Would not answer questions regarding diet or weight loss. Spoke with RN who reports pt has been eating well. He ordered chinese for dinner last night and has consumed 100% of meals since admission. Weights noted to fluctuate likely due to fluid accumulation.   Body mass index is 21.4 kg/m. Patient meets criteria for normal based on current BMI.   Current diet order is regular, patient is consuming approximately 100% of meals at this time. Labs and medications reviewed.   No nutrition interventions warranted at this time. If nutrition issues arise, please consult RD.   Mariana Single RD, LDN Clinical Nutrition Pager # [phone]

## 2017-05-16 NOTE — Progress Notes (Signed)
SICKLE CELL SERVICE PROGRESS NOTE  [name] [name]. XLK:[id number] DOB: Jun 20, 1980 DOA: 05/12/2017 PCP: Massie Maroon, FNP  Assessment/Plan: Active Problems:   Chronic kidney disease   Sickle cell anemia with pain (HCC)   Sickle cell anemia with crisis (HCC)   Encounter for central line placement  1. Hb SS with Crisis:  Oxycodone scheduled and will continue PCA at a decreased bolus dose. He is unable to receive Toradol due to CKD. Expect discharge home tomorrow.  2. CKD III with Nephrotic Syndrome: Started on Cardizem 30 mg q 12 hours yesterday and BP WNL's indicating tolerance of the current dose.  3. Nephrotic Syndrome: No significant edema noted clinically. Also no weights noted from today.  4. Anemia of Chronic Disease: Hb stable at  7.2 g/dL today. This may represent his new baseline as opposed to the previous average Hb of 9.8 g/dL 5 months ago.   5. Reticulocytopenia: He also has a  reticulocytopenia which is likely a reflection of his steady state kidney function. He received a dose of Aranesp yesterday (25 mcg) however no significant response. If no significant response by tomorrow and in patients with SCD and normally high Bone Marrow Activity, increased doses are required to stimulate the bone marrow function. B-12 levels marginal so will continue  B-12 and Folic Acid supplementation. 6. Leukocytosis: Improved. No evidence of infection. Leukocytosis is a feature of Sickle Cell Crisis.  7. Protein Malnutrition: Pt has normal BMI but refused to cooperate with Nutrition evaluation. No recommended intervention.    Code Status: Full Code Family Communication: N/A Disposition Plan: Anticipate discharge tomorrow  [name] A.  Pager [phone]. If 7PM-7AM, please contact night-coverage.  05/16/2017, 4:08 PM  LOS: 4 days   Interim History: Pt reports that he feels better today. Feels that he can manage pain at home at current level. Agreeable to discharge home tomorrow  so that renal and hemodynamic stability can be evaluated without any artificial fluid supplementation or IV medications.   Consultants:  None  Procedures:  None  Antibiotics:  None    Objective: Vitals:   05/16/17 0845 05/16/17 1031 05/16/17 1225 05/16/17 1521  BP:  107/66    Pulse:  86    Resp: 12 15 14 13   Temp:  97.8 F (36.6 C)    TempSrc:  Oral    SpO2: 99% 99%    Weight:      Height:       Weight change:   Intake/Output Summary (Last 24 hours) at 05/16/2017 1608 Last data filed at 05/16/2017 1500 Gross per 24 hour  Intake 540 ml  Output 2550 ml  Net -2010 ml     Physical Exam General: Alert, awake, oriented x3, in no acute distress. In good spirits today. HEENT: [place]/AT PEERL, EOMI, mild icterus. Neck: Trachea midline,  no masses, no thyromegal,y no JVD, no carotid bruit OROPHARYNX:  Moist, No exudate/ erythema/lesions.  Heart: Regular rate and rhythm. II/VI Non-radiating SEM at base. No rubs or gallops, PMI non-displaced, no heaves or thrills on palpation.  Lungs: Clear to auscultation, no wheezing or rhonchi noted. No increased vocal fremitus resonant to percussion  Abdomen: Soft, nontender, nondistended, positive bowel sounds, no masses no hepatosplenomegaly noted.  Neuro: No focal neurological deficits noted cranial nerves II through XII grossly intact. Strength functional in bilateral upper and lower extremities. Pt ambulating in halls without difficulty Musculoskeletal: No warmth swelling or erythema around joints, no spinal tenderness noted.  Psychiatric: Patient alert and oriented x3, good  insight and cognition, good recent to remote recall.     Data Reviewed: Basic Metabolic Panel: Recent Labs  Lab 05/11/17 1449 05/12/17 1000 05/13/17 0346 05/14/17 1644 05/16/17 0437  NA 142 139 136 140 140  K 3.7 3.3* 3.8 4.8 4.8  CL 110* 107 108 112* 114*  CO2 22 24 22  21* 20*  GLUCOSE 84 93 120* 102* 101*  BUN 16 16 14 17  23*  CREATININE 2.38* 2.34*  2.26* 2.41* 2.22*  CALCIUM 7.4* 7.6* 7.5* 7.9* 7.8*   Liver Function Tests: Recent Labs  Lab 05/11/17 1449 05/12/17 1000 05/13/17 0346 05/14/17 1644  AST 47* 45* 37 40  ALT 35 30 26 26   ALKPHOS 279* 229* 196* 232*  BILITOT 0.6 0.8 0.4 0.8  PROT 4.5* 5.0* 4.3* 4.9*  ALBUMIN 1.6* 1.4* 1.2* 1.3*   No results for input(s): LIPASE, AMYLASE in the last 168 hours. No results for input(s): AMMONIA in the last 168 hours. CBC: Recent Labs  Lab 05/11/17 1449 05/12/17 1000 05/13/17 0346 05/14/17 1644 05/16/17 1025  WBC 19.5* 18.4* 19.8* 16.7* 15.2*  NEUTROABS 9.8* 10.4*  --  8.6* 8.1*  HGB 4.9* 7.0* 6.0* 7.7* 7.2*  HCT 15.3* 21.3* 18.3* 23.8* 22.2*  MCV 92 91.4 92.0 90.8 91.4  PLT 805* 641* 552* 491* 474*   Cardiac Enzymes: No results for input(s): CKTOTAL, CKMB, CKMBINDEX, TROPONINI in the last 168 hours. BNP (last 3 results) No results for input(s): BNP in the last 8760 hours.  ProBNP (last 3 results) No results for input(s): PROBNP in the last 8760 hours.  CBG: No results for input(s): GLUCAP in the last 168 hours.  No results found for this or any previous visit (from the past 240 hour(s)).   Studies: Dg Chest 2 View  Result Date: 05/12/2017 CLINICAL DATA:  Acute chest pain today. Patient with sickle cell disease. EXAM: CHEST - 2 VIEW COMPARISON:  04/24/2017 and prior radiographs FINDINGS: UPPER limits normal heart size and increased pulmonary vascularity noted. Mild chronic streaky pulmonary opacities within the LOWER lungs noted. No acute airspace disease, pleural effusion, consolidation or mass noted. No acute bony abnormalities are identified. IMPRESSION: UPPER limits normal heart size without evidence of acute cardiopulmonary disease. Electronically Signed   By: Harmon Pier M.D.   On: 05/12/2017 11:38   US Abdomen Complete  Result Date: 04/25/2017 CLINICAL DATA:  Abdominal pain with ascites. EXAM: ABDOMEN ULTRASOUND COMPLETE DUPLEX LIVER ULTRASOUND TECHNIQUE:  Grayscale and color ultrasound of the abdomen was performed. Color and duplex Doppler ultrasound was also performed to evaluate the hepatic in-flow and out-flow vessels. COMPARISON:  CT abdomen pelvis dated April 19, 2017. FINDINGS: Gallbladder: Surgically absent. Common bile duct: Diameter: 3 mm, normal Liver: No focal lesion identified. Increased in parenchymal echogenicity. Portal vein is patent on color Doppler imaging with normal direction of blood flow towards the liver. IVC: No abnormality visualized. Pancreas: Visualized portion unremarkable. Spleen: Surgically absent. Right Kidney: Length: 10.4 cm. Echogenicity within normal limits. No mass or hydronephrosis visualized. Left Kidney: Length: 9.2 cm. Echogenicity within normal limits. No mass or hydronephrosis visualized. Abdominal aorta: No aneurysm visualized. Other findings: None. Portal Vein Velocities Main: 58.2 cm/sec, hepatopetal Right: 16.4 cm/sec, hepatopetal Left: 25.5 cm/sec, hepatopetal Hepatic Vein Velocities Right: 26.5 cm/sec, hepatofugal Middle: 52.3 cm/sec, hepatofugal Left: 36.5 cm/sec, hepatofugal Hepatic Artery Velocity: 50.4 cm/sec Varices: Absent. Ascites: Trace perihepatic ascites. IMPRESSION: 1. Increased hepatic parenchymal echogenicity as can be seen with liver disease. No focal abnormality. Trace perihepatic ascites. 2. Normal velocities and  direction of flow within the hepatic vessels. Electronically Signed   By: Obie Dredge M.D.   On: 04/25/2017 14:20   US Abdomen Limited  Result Date: 05/02/2017 CLINICAL DATA:  Left lower quadrant pain.  Sickle cell disease. EXAM: ULTRASOUND ABDOMEN LIMITED COMPARISON:  None. FINDINGS: Specific evaluation of the left lower quadrant was requested. There is no evidence of mass or focal fluid collection in the region. One could question mild edema the superficial subcutaneous tissues which is nonspecific. IMPRESSION: No left lower quadrant abnormality identified internally. One could question  mild nonspecific edema the left lower quadrant subcutaneous fat. Electronically Signed   By: Paulina Fusi M.D.   On: 05/02/2017 09:55   US Abdomen Limited  Result Date: 04/23/2017 CLINICAL DATA:  Ascites seen on CT abdomen and pelvis 04/19/2017. Evaluate for paracentesis. EXAM: LIMITED ABDOMEN ULTRASOUND FOR ASCITES TECHNIQUE: Limited ultrasound survey for ascites was performed in all four abdominal quadrants. COMPARISON:  None. FINDINGS: Very little ascitic fluid is identified. The patient is not a candidate for paracentesis. IMPRESSION: As above. Electronically Signed   By: Drusilla Kanner M.D.   On: 04/23/2017 16:05   Dg Chest Port 1 View  Result Date: 05/15/2017 CLINICAL DATA:  Central line placement EXAM: PORTABLE CHEST 1 VIEW COMPARISON:  Chest x-ray of 05/12/2017 FINDINGS: Right central venous line is present with tip overlying the lower SVC near the expected right atrial junction. Linear atelectasis or scarring remains at the left lung base. No pneumonia or effusion is seen. Mediastinal and hilar contours are stable and the heart size is stable. There are degenerative changes noted in the right shoulder which may indicate avascular necrosis of the right humeral head. IMPRESSION: 1. Right central line placement tip overlies the lower SVC just above the expected right atrial junction. No pneumothorax. 2. Linear atelectasis or scarring at the left lung base. 3. Abnormality the right humeral head.  Question AVN. Electronically Signed   By: Dwyane Dee M.D.   On: 05/15/2017 11:32   Dg Chest Port 1 View  Result Date: 04/24/2017 CLINICAL DATA:  Central catheter placement EXAM: PORTABLE CHEST 1 VIEW COMPARISON:  April 07, 2017 FINDINGS: Central catheter tip is in the superior vena cava near the cavoatrial junction. No pneumothorax. There is patchy bibasilar lung atelectatic change. Lungs elsewhere are clear. Heart size and pulmonary vascularity are normal. No adenopathy. There is avascular necrosis in  the right humeral head. IMPRESSION: 1. Central catheter tip in superior vena cava near cavoatrial junction. No pneumothorax. 2. Bibasilar atelectasis, slightly more on the left than the right. Earliest changes of pneumonia cannot be excluded. 3.  Avascular necrosis right humeral head. Electronically Signed   By: Bretta Bang III M.D.   On: 04/24/2017 11:54   Ct Renal Stone Study  Result Date: 04/19/2017 CLINICAL DATA:  Flank pain. Stone disease suspected. Diarrhea and abdominal discomfort. Recent hospital admission for colitis. No improvement, now with weakness. EXAM: CT ABDOMEN AND PELVIS WITHOUT CONTRAST TECHNIQUE: Multidetector CT imaging of the abdomen and pelvis was performed following the standard protocol without IV contrast. COMPARISON:  CT 04/07/2017 FINDINGS: Lower chest: Bibasilar scarring with slight improved lung aeration from prior exam. Trace bilateral pleural thickening. Decreased density of the blood pool consistent with anemia. Hepatobiliary: No focal hepatic lesion. Clips in the gallbladder fossa postcholecystectomy. No biliary dilatation. Pancreas: No ductal dilatation. No definite peripancreatic fat stranding, however mild generalized edema of the upper mesentery. Spleen: Tiny calcified spleen, sequela of sickle cell, unchanged. Adrenals/Urinary Tract: Normal adrenal  glands. Bilateral renal parenchymal atrophy, unchanged from prior exam. There is prominence of the left greater than right renal collecting systems and proximal ureter without calcified stone. Urinary bladder is distended without wall thickening. No calcified urolithiasis. Stomach/Bowel: Persistent and slight worsening colonic wall thickening of the ascending and transverse colon, now with involvement of the mid and proximal descending colon. No evidence of perforation. Possible wall thickening of the duodenum, suboptimally assessed without enteric contrast and presence of intra-abdominal ascites. No other small bowel  inflammation. Normal appendix. Vascular/Lymphatic: Innumerable central mesenteric nodes that are not enlarged by size criteria. Decreased blood pool density consistent with anemia. Reproductive: Prostate is unremarkable. Other: Increased abdominopelvic ascites from prior, small to moderate. Increased mesenteric edema. No free air. No evidence of intra-abdominal abscess. Musculoskeletal: Diffuse osteosclerosis consistent with history of sickle cell. Multiple small endplate depressions throughout spine. No acute finding. IMPRESSION: 1. Persistent/worsening colitis with colonic wall thickening of the ascending and transverse colon, new involvement of the descending colon. Possible new wall thickening of the duodenum. Findings may be infectious or inflammatory. 2. Increased abdominopelvic ascites. 3. Prominence of bilateral renal collecting systems without urolithiasis. This may be secondary to urinary bladder distention. Electronically Signed   By: Rubye Oaks M.D.   On: 04/19/2017 23:43   US Biopsy (kidney)  Result Date: 04/25/2017 CLINICAL DATA:  Six anemia, acute on chronic kidney disease and nephrotic syndrome. Renal biopsy requested. EXAM: ULTRASOUND GUIDED CORE BIOPSY OF LEFT KIDNEY MEDICATIONS: 2.0 mg IV Versed; 150 mcg IV Fentanyl Total Moderate Sedation Time: 16 minutes. The patient's level of consciousness and physiologic status were continuously monitored during the procedure by Radiology nursing. PROCEDURE: The procedure, risks, benefits, and alternatives were explained to the patient. Questions regarding the procedure were encouraged and answered. The patient understands and consents to the procedure. A time out was performed prior to initiating the procedure. Both kidneys were examined by ultrasound. The left flank region was prepped with chlorhexidine in a sterile fashion, and a sterile drape was applied covering the operative field. A sterile gown and sterile gloves were used for the procedure.  Local anesthesia was provided with 1% Lidocaine. Samples of the left lower pole renal cortex were obtained with a 16 gauge core device. Three passes with the core biopsy device were performed and samples sent in saline. COMPLICATIONS: None. FINDINGS: Both kidneys are well visualized by ultrasound. The left kidney was chosen as it is slightly more superficial in location from a posterior approach and also demonstrated slightly thicker renal cortex compared to the right. The first core biopsy sample was short in length. Two additional samples were therefore obtained in order to have a sufficient amount of tissue for pathologic analysis. IMPRESSION: Ultrasound-guided core biopsy performed at the level of left lower pole renal cortex. Electronically Signed   By: Irish Lack M.D.   On: 04/25/2017 13:18   US Abdominal Pelvic Art/vent Flow Doppler  Result Date: 04/25/2017 CLINICAL DATA:  Abdominal pain with ascites. EXAM: ABDOMEN ULTRASOUND COMPLETE DUPLEX LIVER ULTRASOUND TECHNIQUE: Grayscale and color ultrasound of the abdomen was performed. Color and duplex Doppler ultrasound was also performed to evaluate the hepatic in-flow and out-flow vessels. COMPARISON:  CT abdomen pelvis dated April 19, 2017. FINDINGS: Gallbladder: Surgically absent. Common bile duct: Diameter: 3 mm, normal Liver: No focal lesion identified. Increased in parenchymal echogenicity. Portal vein is patent on color Doppler imaging with normal direction of blood flow towards the liver. IVC: No abnormality visualized. Pancreas: Visualized portion unremarkable. Spleen: Surgically absent.  Right Kidney: Length: 10.4 cm. Echogenicity within normal limits. No mass or hydronephrosis visualized. Left Kidney: Length: 9.2 cm. Echogenicity within normal limits. No mass or hydronephrosis visualized. Abdominal aorta: No aneurysm visualized. Other findings: None. Portal Vein Velocities Main: 58.2 cm/sec, hepatopetal Right: 16.4 cm/sec, hepatopetal Left: 25.5  cm/sec, hepatopetal Hepatic Vein Velocities Right: 26.5 cm/sec, hepatofugal Middle: 52.3 cm/sec, hepatofugal Left: 36.5 cm/sec, hepatofugal Hepatic Artery Velocity: 50.4 cm/sec Varices: Absent. Ascites: Trace perihepatic ascites. IMPRESSION: 1. Increased hepatic parenchymal echogenicity as can be seen with liver disease. No focal abnormality. Trace perihepatic ascites. 2. Normal velocities and direction of flow within the hepatic vessels. Electronically Signed   By: Obie Dredge M.D.   On: 04/25/2017 14:20    Scheduled Meds: . diltiazem  30 mg Oral Q12H  . enoxaparin (LOVENOX) injection  40 mg Subcutaneous Q24H  . folic acid  2 mg Oral Daily  . HYDROmorphone   Intravenous Q4H  . pantoprazole  40 mg Oral Q0600  . senna-docusate  1 tablet Oral BID  . sodium bicarbonate  650 mg Oral TID  . Vitamin D (Ergocalciferol)  50,000 Units Oral Weekly   Continuous Infusions: . dextrose 5 % and 0.45% NaCl 10 mL/hr at 05/16/17 1015    Active Problems:   Chronic kidney disease   Sickle cell anemia with pain (HCC)   Sickle cell anemia with crisis (HCC)   Encounter for central line placement    In excess of 35 minutes spent during this visit. Greater than 50% involved face to face contact with the patient for assessment, counseling and coordination of care.

## 2017-05-17 LAB — CBC WITH DIFFERENTIAL/PLATELET
BASOS PCT: 0 %
Basophils Absolute: 0 10*3/uL (ref 0.0–0.1)
Eosinophils Absolute: 0.9 10*3/uL — ABNORMAL HIGH (ref 0.0–0.7)
Eosinophils Relative: 5 %
HEMATOCRIT: 23.4 % — AB (ref 39.0–52.0)
HEMOGLOBIN: 7.5 g/dL — AB (ref 13.0–17.0)
LYMPHS PCT: 49 %
Lymphs Abs: 9.3 10*3/uL — ABNORMAL HIGH (ref 0.7–4.0)
MCH: 29.3 pg (ref 26.0–34.0)
MCHC: 32.1 g/dL (ref 30.0–36.0)
MCV: 91.4 fL (ref 78.0–100.0)
MONO ABS: 1.5 10*3/uL — AB (ref 0.1–1.0)
MONOS PCT: 8 %
NEUTROS PCT: 38 %
Neutro Abs: 7.1 10*3/uL (ref 1.7–7.7)
Platelets: 473 10*3/uL — ABNORMAL HIGH (ref 150–400)
RBC: 2.56 MIL/uL — AB (ref 4.22–5.81)
RDW: 19.2 % — ABNORMAL HIGH (ref 11.5–15.5)
WBC: 18.8 10*3/uL — ABNORMAL HIGH (ref 4.0–10.5)

## 2017-05-17 LAB — BASIC METABOLIC PANEL
Anion gap: 3 — ABNORMAL LOW (ref 5–15)
BUN: 20 mg/dL (ref 6–20)
CHLORIDE: 115 mmol/L — AB (ref 101–111)
CO2: 20 mmol/L — AB (ref 22–32)
CREATININE: 2.13 mg/dL — AB (ref 0.61–1.24)
Calcium: 8 mg/dL — ABNORMAL LOW (ref 8.9–10.3)
GFR calc non Af Amer: 39 mL/min — ABNORMAL LOW (ref >60)
GFR, EST AFRICAN AMERICAN: 45 mL/min — AB (ref >60)
Glucose, Bld: 101 mg/dL — ABNORMAL HIGH (ref 65–99)
Potassium: 4.6 mmol/L (ref 3.5–5.1)
Sodium: 138 mmol/L (ref 135–145)

## 2017-05-17 LAB — RETICULOCYTES
RBC.: 2.56 MIL/uL — ABNORMAL LOW (ref 4.22–5.81)
RETIC COUNT ABSOLUTE: 56.3 10*3/uL (ref 19.0–186.0)
Retic Ct Pct: 2.2 % (ref 0.4–3.1)

## 2017-05-17 LAB — LACTATE DEHYDROGENASE: LDH: 242 U/L — AB (ref 98–192)

## 2017-05-17 MED ORDER — OXYCODONE HCL 5 MG PO TABS
15.0000 mg | ORAL_TABLET | ORAL | Status: DC | PRN
  Administered 2017-05-17 – 2017-05-18 (×6): 15 mg via ORAL
  Filled 2017-05-17 (×6): qty 3

## 2017-05-17 MED ORDER — HYDROCODONE-ACETAMINOPHEN 5-325 MG PO TABS
1.0000 | ORAL_TABLET | Freq: Once | ORAL | Status: DC
  Filled 2017-05-17: qty 1

## 2017-05-17 MED ORDER — DILTIAZEM HCL 30 MG PO TABS: 30.0000 mg | ORAL_TABLET | Freq: Two times a day (BID) | ORAL | 1 refills | Status: DC

## 2017-05-17 MED ORDER — DARBEPOETIN ALFA 40 MCG/0.4ML IJ SOSY
40.0000 ug | PREFILLED_SYRINGE | Freq: Once | INTRAMUSCULAR | Status: AC
  Administered 2017-05-17: 40 ug via INTRAVENOUS
  Filled 2017-05-17 (×2): qty 0.4

## 2017-05-17 NOTE — Progress Notes (Signed)
SICKLE CELL SERVICE PROGRESS NOTE  [name] [name]. ZOX:[id number] DOB: 13-Dec-1980 DOA: 05/12/2017 PCP: Massie Maroon, FNP  Assessment/Plan: Active Problems:   Chronic kidney disease   Sickle cell anemia with pain (HCC)   Sickle cell anemia with crisis (HCC)   Encounter for central line placement  1. Hb SS with Crisis: I attribute the increased pain to the increased bone marrow activity associated with ESA. Will increase Oxycodone to 15 mg and continue the PCA for PRN use. He is unable to receive Toradol due to CKD. Expect discharge home tomorrow.  2. CKD III with Nephrotic Syndrome: Continue Cardizem 30 mg q 12 hours. BP WNL's indicating tolerance of the current dose.  3. Nephrotic Syndrome: No significant edema noted clinically. Also no weights noted from today. PT refused yesterday. Same today???.  4. Anemia of Chronic Disease: Hb stable at  7.5 g/dL today. Hydrea still on hold due to reticulocytopenia.  5. Reticulocytopenia: Mild increase in bone marrow activity. Will give another dose of Aranesp today. Continue  B-12 and Folic Acid supplementation. 6. Leukocytosis:Increased with increase in bone marrow activity. 7. Underweight Pt has normal BMI but refused to cooperate with Nutrition evaluation. No recommended intervention.    Code Status: Full Code Family Communication: N/A Disposition Plan: Anticipate discharge tomorrow  Daanish Copes A.  Pager [phone]. If 7PM-7AM, please contact night-coverage.  05/17/2017, 11:36 AM  LOS: 5 days   Interim History: Pt reports that the pain in his arms and legs increased last night and is now 10/10. He has used 21.6 mg with 77/54:demands/delivereis in the last 24 hours.   Consultants:  None  Procedures:  None  Antibiotics:  None    Objective: Vitals:   05/16/17 2336 05/17/17 0000 05/17/17 0448 05/17/17 0758  BP:  104/66    Pulse:  (!) 105    Resp: 12 10 12 14   Temp:  98.2 F (36.8 C)    TempSrc:  Oral    SpO2:  98% 99% 98% 94%  Weight:      Height:       Weight change:   Intake/Output Summary (Last 24 hours) at 05/17/2017 1136 Last data filed at 05/17/2017 0705 Gross per 24 hour  Intake 350 ml  Output 2200 ml  Net -1850 ml     Physical Exam General: Alert, awake, oriented x3, in no acute distress. In good spirits today. HEENT: [place]/AT PEERL, EOMI, mild icterus. Neck: Trachea midline,  no masses, no thyromegal,y no JVD, no carotid bruit OROPHARYNX:  Moist, No exudate/ erythema/lesions.  Heart: Regular rate and rhythm. II/VI Non-radiating SEM at base. No rubs or gallops, PMI non-displaced, no heaves or thrills on palpation.  Lungs: Clear to auscultation, no wheezing or rhonchi noted. No increased vocal fremitus resonant to percussion  Abdomen: Soft, nontender, nondistended, positive bowel sounds, no masses no hepatosplenomegaly noted.  Neuro: No focal neurological deficits noted cranial nerves II through XII grossly intact. Strength functional in bilateral upper and lower extremities. Pt ambulating in halls without difficulty Musculoskeletal: No warmth swelling or erythema around joints, no spinal tenderness noted.  Psychiatric: Patient alert and oriented x3, good insight and cognition, good recent to remote recall.     Data Reviewed: Basic Metabolic Panel: Recent Labs  Lab 05/12/17 1000 05/13/17 0346 05/14/17 1644 05/16/17 0437 05/17/17 0534  NA 139 136 140 140 138  K 3.3* 3.8 4.8 4.8 4.6  CL 107 108 112* 114* 115*  CO2 24 22 21* 20* 20*  GLUCOSE 93 120* 102*  101* 101*  BUN 16 14 17  23* 20  CREATININE 2.34* 2.26* 2.41* 2.22* 2.13*  CALCIUM 7.6* 7.5* 7.9* 7.8* 8.0*   Liver Function Tests: Recent Labs  Lab 05/11/17 1449 05/12/17 1000 05/13/17 0346 05/14/17 1644  AST 47* 45* 37 40  ALT 35 30 26 26   ALKPHOS 279* 229* 196* 232*  BILITOT 0.6 0.8 0.4 0.8  PROT 4.5* 5.0* 4.3* 4.9*  ALBUMIN 1.6* 1.4* 1.2* 1.3*   No results for input(s): LIPASE, AMYLASE in the last 168  hours. No results for input(s): AMMONIA in the last 168 hours. CBC: Recent Labs  Lab 05/11/17 1449 05/12/17 1000 05/13/17 0346 05/14/17 1644 05/16/17 1025 05/17/17 0534  WBC 19.5* 18.4* 19.8* 16.7* 15.2* 18.8*  NEUTROABS 9.8* 10.4*  --  8.6* 8.1* 7.1  HGB 4.9* 7.0* 6.0* 7.7* 7.2* 7.5*  HCT 15.3* 21.3* 18.3* 23.8* 22.2* 23.4*  MCV 92 91.4 92.0 90.8 91.4 91.4  PLT 805* 641* 552* 491* 474* 473*   Cardiac Enzymes: No results for input(s): CKTOTAL, CKMB, CKMBINDEX, TROPONINI in the last 168 hours. BNP (last 3 results) No results for input(s): BNP in the last 8760 hours.  ProBNP (last 3 results) No results for input(s): PROBNP in the last 8760 hours.  CBG: No results for input(s): GLUCAP in the last 168 hours.  No results found for this or any previous visit (from the past 240 hour(s)).   Studies: Dg Chest 2 View  Result Date: 05/12/2017 CLINICAL DATA:  Acute chest pain today. Patient with sickle cell disease. EXAM: CHEST - 2 VIEW COMPARISON:  04/24/2017 and prior radiographs FINDINGS: UPPER limits normal heart size and increased pulmonary vascularity noted. Mild chronic streaky pulmonary opacities within the LOWER lungs noted. No acute airspace disease, pleural effusion, consolidation or mass noted. No acute bony abnormalities are identified. IMPRESSION: UPPER limits normal heart size without evidence of acute cardiopulmonary disease. Electronically Signed   By: Harmon Pier M.D.   On: 05/12/2017 11:38   US Abdomen Complete  Result Date: 04/25/2017 CLINICAL DATA:  Abdominal pain with ascites. EXAM: ABDOMEN ULTRASOUND COMPLETE DUPLEX LIVER ULTRASOUND TECHNIQUE: Grayscale and color ultrasound of the abdomen was performed. Color and duplex Doppler ultrasound was also performed to evaluate the hepatic in-flow and out-flow vessels. COMPARISON:  CT abdomen pelvis dated April 19, 2017. FINDINGS: Gallbladder: Surgically absent. Common bile duct: Diameter: 3 mm, normal Liver: No focal lesion  identified. Increased in parenchymal echogenicity. Portal vein is patent on color Doppler imaging with normal direction of blood flow towards the liver. IVC: No abnormality visualized. Pancreas: Visualized portion unremarkable. Spleen: Surgically absent. Right Kidney: Length: 10.4 cm. Echogenicity within normal limits. No mass or hydronephrosis visualized. Left Kidney: Length: 9.2 cm. Echogenicity within normal limits. No mass or hydronephrosis visualized. Abdominal aorta: No aneurysm visualized. Other findings: None. Portal Vein Velocities Main: 58.2 cm/sec, hepatopetal Right: 16.4 cm/sec, hepatopetal Left: 25.5 cm/sec, hepatopetal Hepatic Vein Velocities Right: 26.5 cm/sec, hepatofugal Middle: 52.3 cm/sec, hepatofugal Left: 36.5 cm/sec, hepatofugal Hepatic Artery Velocity: 50.4 cm/sec Varices: Absent. Ascites: Trace perihepatic ascites. IMPRESSION: 1. Increased hepatic parenchymal echogenicity as can be seen with liver disease. No focal abnormality. Trace perihepatic ascites. 2. Normal velocities and direction of flow within the hepatic vessels. Electronically Signed   By: Obie Dredge M.D.   On: 04/25/2017 14:20   US Abdomen Limited  Result Date: 05/02/2017 CLINICAL DATA:  Left lower quadrant pain.  Sickle cell disease. EXAM: ULTRASOUND ABDOMEN LIMITED COMPARISON:  None. FINDINGS: Specific evaluation of the left lower quadrant  was requested. There is no evidence of mass or focal fluid collection in the region. One could question mild edema the superficial subcutaneous tissues which is nonspecific. IMPRESSION: No left lower quadrant abnormality identified internally. One could question mild nonspecific edema the left lower quadrant subcutaneous fat. Electronically Signed   By: Paulina Fusi M.D.   On: 05/02/2017 09:55   US Abdomen Limited  Result Date: 04/23/2017 CLINICAL DATA:  Ascites seen on CT abdomen and pelvis 04/19/2017. Evaluate for paracentesis. EXAM: LIMITED ABDOMEN ULTRASOUND FOR ASCITES  TECHNIQUE: Limited ultrasound survey for ascites was performed in all four abdominal quadrants. COMPARISON:  None. FINDINGS: Very little ascitic fluid is identified. The patient is not a candidate for paracentesis. IMPRESSION: As above. Electronically Signed   By: Drusilla Kanner M.D.   On: 04/23/2017 16:05   Dg Chest Port 1 View  Result Date: 05/15/2017 CLINICAL DATA:  Central line placement EXAM: PORTABLE CHEST 1 VIEW COMPARISON:  Chest x-ray of 05/12/2017 FINDINGS: Right central venous line is present with tip overlying the lower SVC near the expected right atrial junction. Linear atelectasis or scarring remains at the left lung base. No pneumonia or effusion is seen. Mediastinal and hilar contours are stable and the heart size is stable. There are degenerative changes noted in the right shoulder which may indicate avascular necrosis of the right humeral head. IMPRESSION: 1. Right central line placement tip overlies the lower SVC just above the expected right atrial junction. No pneumothorax. 2. Linear atelectasis or scarring at the left lung base. 3. Abnormality the right humeral head.  Question AVN. Electronically Signed   By: Dwyane Dee M.D.   On: 05/15/2017 11:32   Dg Chest Port 1 View  Result Date: 04/24/2017 CLINICAL DATA:  Central catheter placement EXAM: PORTABLE CHEST 1 VIEW COMPARISON:  April 07, 2017 FINDINGS: Central catheter tip is in the superior vena cava near the cavoatrial junction. No pneumothorax. There is patchy bibasilar lung atelectatic change. Lungs elsewhere are clear. Heart size and pulmonary vascularity are normal. No adenopathy. There is avascular necrosis in the right humeral head. IMPRESSION: 1. Central catheter tip in superior vena cava near cavoatrial junction. No pneumothorax. 2. Bibasilar atelectasis, slightly more on the left than the right. Earliest changes of pneumonia cannot be excluded. 3.  Avascular necrosis right humeral head. Electronically Signed   By: Bretta Bang III M.D.   On: 04/24/2017 11:54   Ct Renal Stone Study  Result Date: 04/19/2017 CLINICAL DATA:  Flank pain. Stone disease suspected. Diarrhea and abdominal discomfort. Recent hospital admission for colitis. No improvement, now with weakness. EXAM: CT ABDOMEN AND PELVIS WITHOUT CONTRAST TECHNIQUE: Multidetector CT imaging of the abdomen and pelvis was performed following the standard protocol without IV contrast. COMPARISON:  CT 04/07/2017 FINDINGS: Lower chest: Bibasilar scarring with slight improved lung aeration from prior exam. Trace bilateral pleural thickening. Decreased density of the blood pool consistent with anemia. Hepatobiliary: No focal hepatic lesion. Clips in the gallbladder fossa postcholecystectomy. No biliary dilatation. Pancreas: No ductal dilatation. No definite peripancreatic fat stranding, however mild generalized edema of the upper mesentery. Spleen: Tiny calcified spleen, sequela of sickle cell, unchanged. Adrenals/Urinary Tract: Normal adrenal glands. Bilateral renal parenchymal atrophy, unchanged from prior exam. There is prominence of the left greater than right renal collecting systems and proximal ureter without calcified stone. Urinary bladder is distended without wall thickening. No calcified urolithiasis. Stomach/Bowel: Persistent and slight worsening colonic wall thickening of the ascending and transverse colon, now with involvement of the  mid and proximal descending colon. No evidence of perforation. Possible wall thickening of the duodenum, suboptimally assessed without enteric contrast and presence of intra-abdominal ascites. No other small bowel inflammation. Normal appendix. Vascular/Lymphatic: Innumerable central mesenteric nodes that are not enlarged by size criteria. Decreased blood pool density consistent with anemia. Reproductive: Prostate is unremarkable. Other: Increased abdominopelvic ascites from prior, small to moderate. Increased mesenteric edema. No free  air. No evidence of intra-abdominal abscess. Musculoskeletal: Diffuse osteosclerosis consistent with history of sickle cell. Multiple small endplate depressions throughout spine. No acute finding. IMPRESSION: 1. Persistent/worsening colitis with colonic wall thickening of the ascending and transverse colon, new involvement of the descending colon. Possible new wall thickening of the duodenum. Findings may be infectious or inflammatory. 2. Increased abdominopelvic ascites. 3. Prominence of bilateral renal collecting systems without urolithiasis. This may be secondary to urinary bladder distention. Electronically Signed   By: Rubye Oaks M.D.   On: 04/19/2017 23:43   US Biopsy (kidney)  Result Date: 04/25/2017 CLINICAL DATA:  Six anemia, acute on chronic kidney disease and nephrotic syndrome. Renal biopsy requested. EXAM: ULTRASOUND GUIDED CORE BIOPSY OF LEFT KIDNEY MEDICATIONS: 2.0 mg IV Versed; 150 mcg IV Fentanyl Total Moderate Sedation Time: 16 minutes. The patient's level of consciousness and physiologic status were continuously monitored during the procedure by Radiology nursing. PROCEDURE: The procedure, risks, benefits, and alternatives were explained to the patient. Questions regarding the procedure were encouraged and answered. The patient understands and consents to the procedure. A time out was performed prior to initiating the procedure. Both kidneys were examined by ultrasound. The left flank region was prepped with chlorhexidine in a sterile fashion, and a sterile drape was applied covering the operative field. A sterile gown and sterile gloves were used for the procedure. Local anesthesia was provided with 1% Lidocaine. Samples of the left lower pole renal cortex were obtained with a 16 gauge core device. Three passes with the core biopsy device were performed and samples sent in saline. COMPLICATIONS: None. FINDINGS: Both kidneys are well visualized by ultrasound. The left kidney was chosen  as it is slightly more superficial in location from a posterior approach and also demonstrated slightly thicker renal cortex compared to the right. The first core biopsy sample was short in length. Two additional samples were therefore obtained in order to have a sufficient amount of tissue for pathologic analysis. IMPRESSION: Ultrasound-guided core biopsy performed at the level of left lower pole renal cortex. Electronically Signed   By: Irish Lack M.D.   On: 04/25/2017 13:18   US Abdominal Pelvic Art/vent Flow Doppler  Result Date: 04/25/2017 CLINICAL DATA:  Abdominal pain with ascites. EXAM: ABDOMEN ULTRASOUND COMPLETE DUPLEX LIVER ULTRASOUND TECHNIQUE: Grayscale and color ultrasound of the abdomen was performed. Color and duplex Doppler ultrasound was also performed to evaluate the hepatic in-flow and out-flow vessels. COMPARISON:  CT abdomen pelvis dated April 19, 2017. FINDINGS: Gallbladder: Surgically absent. Common bile duct: Diameter: 3 mm, normal Liver: No focal lesion identified. Increased in parenchymal echogenicity. Portal vein is patent on color Doppler imaging with normal direction of blood flow towards the liver. IVC: No abnormality visualized. Pancreas: Visualized portion unremarkable. Spleen: Surgically absent. Right Kidney: Length: 10.4 cm. Echogenicity within normal limits. No mass or hydronephrosis visualized. Left Kidney: Length: 9.2 cm. Echogenicity within normal limits. No mass or hydronephrosis visualized. Abdominal aorta: No aneurysm visualized. Other findings: None. Portal Vein Velocities Main: 58.2 cm/sec, hepatopetal Right: 16.4 cm/sec, hepatopetal Left: 25.5 cm/sec, hepatopetal Hepatic Vein Velocities Right: 26.5  cm/sec, hepatofugal Middle: 52.3 cm/sec, hepatofugal Left: 36.5 cm/sec, hepatofugal Hepatic Artery Velocity: 50.4 cm/sec Varices: Absent. Ascites: Trace perihepatic ascites. IMPRESSION: 1. Increased hepatic parenchymal echogenicity as can be seen with liver disease. No  focal abnormality. Trace perihepatic ascites. 2. Normal velocities and direction of flow within the hepatic vessels. Electronically Signed   By: Obie Dredge M.D.   On: 04/25/2017 14:20    Scheduled Meds: . darbepoetin (ARANESP) injection - DIALYSIS  40 mcg Intravenous Once  . diltiazem  30 mg Oral Q12H  . enoxaparin (LOVENOX) injection  40 mg Subcutaneous Q24H  . folic acid  2 mg Oral Daily  . HYDROcodone-acetaminophen  1 tablet Oral Once  . HYDROmorphone   Intravenous Q4H  . pantoprazole  40 mg Oral Q0600  . senna-docusate  1 tablet Oral BID  . sodium bicarbonate  650 mg Oral TID  . Vitamin D (Ergocalciferol)  50,000 Units Oral Weekly   Continuous Infusions: . dextrose 5 % and 0.45% NaCl 10 mL/hr at 05/16/17 1015    Active Problems:   Chronic kidney disease   Sickle cell anemia with pain (HCC)   Sickle cell anemia with crisis (HCC)   Encounter for central line placement    In excess of 35 minutes spent during this visit. Greater than 50% involved face to face contact with the patient for assessment, counseling and coordination of care.

## 2017-05-18 MED ORDER — OXYCODONE HCL ER 15 MG PO T12A: 15.0000 mg | EXTENDED_RELEASE_TABLET | Freq: Two times a day (BID) | ORAL | 0 refills | Status: AC

## 2017-05-18 MED ORDER — OXYCODONE HCL 10 MG PO TABS: 10.0000 mg | ORAL_TABLET | Freq: Four times a day (QID) | ORAL | 0 refills | Status: DC | PRN

## 2017-05-18 NOTE — Discharge Summary (Signed)
Physician Discharge Summary  [name] [name]. ZOX:[id number] DOB: [birth year] DOA: 05/12/2017  PCP: [name], FNP  Admit date: 05/12/2017  Discharge date: 05/18/2017  Discharge Diagnoses:  Active Problems:   Chronic kidney disease   Sickle cell anemia with pain (HCC)   Sickle cell anemia with crisis [facility])  Discharge Condition: Stable  Disposition:  Follow-up Information    Please follow up.   Why:  Patient has an appt onThursday April 25 at 1045. At [facility] in [place]. It is the [facility].       [place], [place].   Why:  Patient has a call back appt. in June. The office will notify the patient via mail with an office card. Contact information: [place] [address] [phone]          Pt is discharged home in good condition and is to follow up with [name], FNP this week to have labs evaluated. He is instructed to increase activity slowly and balance with rest for the next few days, and use prescribed medication to complete treatment of pain  Diet: Regular  Wt Readings from Last 3 Encounters:  05/18/17 110 lb (49.9 kg)  05/11/17 123 lb (55.8 kg)  05/02/17 121 lb 11.1 oz (55.2 kg)   History of present illness:  [name] [name]  is a 37 y.o. male who presented to the ED with c/o gradual onset and worsening of constant acute flair of his chronic sickle cell "pains" for the past 1 week. Patient states he was seen by his PMD in the follow up yesterday for hospital follow up of his previous admission and had routine labs completed. Pt was called this morning and told to come to the ED for further evaluation/admission for "low Hb of 4.9." Denies any change in his usual sickle cell pains in his arms/legs. Denies CP/palpitations, no SOB/cough, no abd pain, no N/V/D, no rash, no fevers.   ED Course: Patient had repeat H and H and Hb was actually 7.0. He however did not respond well to pain control in the ED. Serum  Creatinine is now at baseline of around 2.3. CXR showed "UPPER limits normal heart size without evidence of acute cardiopulmonary disease".  Hospital Course:  Patient was admitted for Sickle Cell With Pain Crisis, and was managed appropriately with individualized sickle cell pain management protocol which include IVF, IV Dilaudid via PCA and other adjunct therapies. Unable to receive Toradol due to CKD. 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 Since discharge he has not been seen by Nephrology but reports LE and scrotal swelling which slowly improved during this admission with low sodium diet. Hb initially reported to be 4.9, went up to 7.0 on admission and at the time of discharge, after injection of Aranesp, 1 dose, his Hb was 7.5 even though this is lower than his baseline of about 9 5 months ago, this could be his new baseline as his CKD progresses. He improved in all symptoms and pain returned to baseline, he was discharged home in a hemodynamically stable condition to follow up with PCP, Hematologist and Nephrologist.  Discharge Exam: Vitals:   05/18/17 0313 05/18/17 1013  BP:    Pulse:    Resp: 12 14  Temp:    SpO2:  94%   Vitals:   05/18/17 0258 05/18/17 0313 05/18/17 0633 05/18/17 1013  BP: 110/68  Pulse: (!) 101     Resp: 11 12  14   Temp: 97.6 F (36.4 C)     TempSrc: Oral     SpO2: 100%   94%  Weight:   110 lb (49.9 kg)   Height:       General appearance : Awake, alert, not in any distress. Speech Clear. Not toxic looking HEENT: Atraumatic and Normocephalic, pupils equally reactive to light and accomodation Neck: Supple, no JVD. No cervical lymphadenopathy.  Chest: Good air entry bilaterally, no added sounds  CVS: S1 S2 regular, no murmurs.  Abdomen: Bowel sounds present, Non tender and not distended with no gaurding, rigidity or rebound. Extremities: B/L Lower Ext shows no edema, both legs are warm to  touch Neurology: Awake alert, and oriented X 3, CN II-XII intact, Non focal Skin: No Rash  Discharge Instructions  Discharge Instructions    Diet - low sodium heart healthy   Complete by:  As directed    Increase activity slowly   Complete by:  As directed      Allergies as of 05/18/2017      Reactions   Nsaids    Morphine And Related Other (See Comments)   "real bad headaches"      Medication List    TAKE these medications   diltiazem 30 MG tablet Commonly known as:  CARDIZEM Take 1 tablet (30 mg total) by mouth every 12 (twelve) hours.   ergocalciferol 50000 units capsule Commonly known as:  DRISDOL Take 1 capsule (50,000 Units total) by mouth once a week.   folic acid 1 MG tablet Commonly known as:  FOLVITE Take 1 tablet (1 mg total) daily by mouth.   nortriptyline 25 MG capsule Commonly known as:  PAMELOR TAKE 1 CAPSULE (25 MG TOTAL) BY MOUTH AT BEDTIME.   Oxycodone HCl 10 MG Tabs Take 1 tablet (10 mg total) by mouth every 6 (six) hours as needed for up to 7 days. What changed:  Another medication with the same name was added. Make sure you understand how and when to take each.   oxyCODONE 15 mg 12 hr tablet Commonly known as:  OXYCONTIN Take 1 tablet (15 mg total) by mouth every 12 (twelve) hours for 7 days. What changed:  You were already taking a medication with the same name, and this prescription was added. Make sure you understand how and when to take each.   sodium bicarbonate 650 MG tablet Take 1 tablet (650 mg total) 3 (three) times daily by mouth.       The results of significant diagnostics from this hospitalization (including imaging, microbiology, ancillary and laboratory) are listed below for reference.    Significant Diagnostic Studies: Dg Chest 2 View  Result Date: 05/12/2017 CLINICAL DATA:  Acute chest pain today. Patient with sickle cell disease. EXAM: CHEST - 2 VIEW COMPARISON:  04/24/2017 and prior radiographs FINDINGS: UPPER limits  normal heart size and increased pulmonary vascularity noted. Mild chronic streaky pulmonary opacities within the LOWER lungs noted. No acute airspace disease, pleural effusion, consolidation or mass noted. No acute bony abnormalities are identified. IMPRESSION: UPPER limits normal heart size without evidence of acute cardiopulmonary disease. Electronically Signed   By: Margarette Canada M.D.   On: 05/12/2017 11:38   US Abdomen Complete  Result Date: 04/25/2017 CLINICAL DATA:  Abdominal pain with ascites. EXAM: ABDOMEN ULTRASOUND COMPLETE DUPLEX LIVER ULTRASOUND TECHNIQUE: Grayscale and color ultrasound of the abdomen was performed. Color and duplex Doppler ultrasound was also performed to  evaluate the hepatic in-flow and out-flow vessels. COMPARISON:  CT abdomen pelvis dated April 19, 2017. FINDINGS: Gallbladder: Surgically absent. Common bile duct: Diameter: 3 mm, normal Liver: No focal lesion identified. Increased in parenchymal echogenicity. Portal vein is patent on color Doppler imaging with normal direction of blood flow towards the liver. IVC: No abnormality visualized. Pancreas: Visualized portion unremarkable. Spleen: Surgically absent. Right Kidney: Length: 10.4 cm. Echogenicity within normal limits. No mass or hydronephrosis visualized. Left Kidney: Length: 9.2 cm. Echogenicity within normal limits. No mass or hydronephrosis visualized. Abdominal aorta: No aneurysm visualized. Other findings: None. Portal Vein Velocities Main: 58.2 cm/sec, hepatopetal Right: 16.4 cm/sec, hepatopetal Left: 25.5 cm/sec, hepatopetal Hepatic Vein Velocities Right: 26.5 cm/sec, hepatofugal Middle: 52.3 cm/sec, hepatofugal Left: 36.5 cm/sec, hepatofugal Hepatic Artery Velocity: 50.4 cm/sec Varices: Absent. Ascites: Trace perihepatic ascites. IMPRESSION: 1. Increased hepatic parenchymal echogenicity as can be seen with liver disease. No focal abnormality. Trace perihepatic ascites. 2. Normal velocities and direction of flow within  the hepatic vessels. Electronically Signed   By: Titus Dubin M.D.   On: 04/25/2017 14:20   US Abdomen Limited  Result Date: 05/02/2017 CLINICAL DATA:  Left lower quadrant pain.  Sickle cell disease. EXAM: ULTRASOUND ABDOMEN LIMITED COMPARISON:  None. FINDINGS: Specific evaluation of the left lower quadrant was requested. There is no evidence of mass or focal fluid collection in the region. One could question mild edema the superficial subcutaneous tissues which is nonspecific. IMPRESSION: No left lower quadrant abnormality identified internally. One could question mild nonspecific edema the left lower quadrant subcutaneous fat. Electronically Signed   By: Nelson Chimes M.D.   On: 05/02/2017 09:55   US Abdomen Limited  Result Date: 04/23/2017 CLINICAL DATA:  Ascites seen on CT abdomen and pelvis 04/19/2017. Evaluate for paracentesis. EXAM: LIMITED ABDOMEN ULTRASOUND FOR ASCITES TECHNIQUE: Limited ultrasound survey for ascites was performed in all four abdominal quadrants. COMPARISON:  None. FINDINGS: Very little ascitic fluid is identified. The patient is not a candidate for paracentesis. IMPRESSION: As above. Electronically Signed   By: Inge Rise M.D.   On: 04/23/2017 16:05   Dg Chest Port 1 View  Result Date: 05/15/2017 CLINICAL DATA:  Central line placement EXAM: PORTABLE CHEST 1 VIEW COMPARISON:  Chest x-ray of 05/12/2017 FINDINGS: Right central venous line is present with tip overlying the lower SVC near the expected right atrial junction. Linear atelectasis or scarring remains at the left lung base. No pneumonia or effusion is seen. Mediastinal and hilar contours are stable and the heart size is stable. There are degenerative changes noted in the right shoulder which may indicate avascular necrosis of the right humeral head. IMPRESSION: 1. Right central line placement tip overlies the lower SVC just above the expected right atrial junction. No pneumothorax. 2. Linear atelectasis or scarring  at the left lung base. 3. Abnormality the right humeral head.  Question AVN. Electronically Signed   By: Ivar Drape M.D.   On: 05/15/2017 11:32   Dg Chest Port 1 View  Result Date: 04/24/2017 CLINICAL DATA:  Central catheter placement EXAM: PORTABLE CHEST 1 VIEW COMPARISON:  April 07, 2017 FINDINGS: Central catheter tip is in the superior vena cava near the cavoatrial junction. No pneumothorax. There is patchy bibasilar lung atelectatic change. Lungs elsewhere are clear. Heart size and pulmonary vascularity are normal. No adenopathy. There is avascular necrosis in the right humeral head. IMPRESSION: 1. Central catheter tip in superior vena cava near cavoatrial junction. No pneumothorax. 2. Bibasilar atelectasis, slightly more on the  left than the right. Earliest changes of pneumonia cannot be excluded. 3.  Avascular necrosis right humeral head. Electronically Signed   By: Lowella Grip III M.D.   On: 04/24/2017 11:54   Ct Renal Stone Study  Result Date: 04/19/2017 CLINICAL DATA:  Flank pain. Stone disease suspected. Diarrhea and abdominal discomfort. Recent hospital admission for colitis. No improvement, now with weakness. EXAM: CT ABDOMEN AND PELVIS WITHOUT CONTRAST TECHNIQUE: Multidetector CT imaging of the abdomen and pelvis was performed following the standard protocol without IV contrast. COMPARISON:  CT 04/07/2017 FINDINGS: Lower chest: Bibasilar scarring with slight improved lung aeration from prior exam. Trace bilateral pleural thickening. Decreased density of the blood pool consistent with anemia. Hepatobiliary: No focal hepatic lesion. Clips in the gallbladder fossa postcholecystectomy. No biliary dilatation. Pancreas: No ductal dilatation. No definite peripancreatic fat stranding, however mild generalized edema of the upper mesentery. Spleen: Tiny calcified spleen, sequela of sickle cell, unchanged. Adrenals/Urinary Tract: Normal adrenal glands. Bilateral renal parenchymal atrophy,  unchanged from prior exam. There is prominence of the left greater than right renal collecting systems and proximal ureter without calcified stone. Urinary bladder is distended without wall thickening. No calcified urolithiasis. Stomach/Bowel: Persistent and slight worsening colonic wall thickening of the ascending and transverse colon, now with involvement of the mid and proximal descending colon. No evidence of perforation. Possible wall thickening of the duodenum, suboptimally assessed without enteric contrast and presence of intra-abdominal ascites. No other small bowel inflammation. Normal appendix. Vascular/Lymphatic: Innumerable central mesenteric nodes that are not enlarged by size criteria. Decreased blood pool density consistent with anemia. Reproductive: Prostate is unremarkable. Other: Increased abdominopelvic ascites from prior, small to moderate. Increased mesenteric edema. No free air. No evidence of intra-abdominal abscess. Musculoskeletal: Diffuse osteosclerosis consistent with history of sickle cell. Multiple small endplate depressions throughout spine. No acute finding. IMPRESSION: 1. Persistent/worsening colitis with colonic wall thickening of the ascending and transverse colon, new involvement of the descending colon. Possible new wall thickening of the duodenum. Findings may be infectious or inflammatory. 2. Increased abdominopelvic ascites. 3. Prominence of bilateral renal collecting systems without urolithiasis. This may be secondary to urinary bladder distention. Electronically Signed   By: Jeb Levering M.D.   On: 04/19/2017 23:43   US Biopsy (kidney)  Result Date: 04/25/2017 CLINICAL DATA:  Six anemia, acute on chronic kidney disease and nephrotic syndrome. Renal biopsy requested. EXAM: ULTRASOUND GUIDED CORE BIOPSY OF LEFT KIDNEY MEDICATIONS: 2.0 mg IV Versed; 150 mcg IV Fentanyl Total Moderate Sedation Time: 16 minutes. The patient's level of consciousness and physiologic status  were continuously monitored during the procedure by Radiology nursing. PROCEDURE: The procedure, risks, benefits, and alternatives were explained to the patient. Questions regarding the procedure were encouraged and answered. The patient understands and consents to the procedure. A time out was performed prior to initiating the procedure. Both kidneys were examined by ultrasound. The left flank region was prepped with chlorhexidine in a sterile fashion, and a sterile drape was applied covering the operative field. A sterile gown and sterile gloves were used for the procedure. Local anesthesia was provided with 1% Lidocaine. Samples of the left lower pole renal cortex were obtained with a 16 gauge core device. Three passes with the core biopsy device were performed and samples sent in saline. COMPLICATIONS: None. FINDINGS: Both kidneys are well visualized by ultrasound. The left kidney was chosen as it is slightly more superficial in location from a posterior approach and also demonstrated slightly thicker renal cortex compared to the right.  The first core biopsy sample was short in length. Two additional samples were therefore obtained in order to have a sufficient amount of tissue for pathologic analysis. IMPRESSION: Ultrasound-guided core biopsy performed at the level of left lower pole renal cortex. Electronically Signed   By: Aletta Edouard M.D.   On: 04/25/2017 13:18   US Abdominal Pelvic Art/vent Flow Doppler  Result Date: 04/25/2017 CLINICAL DATA:  Abdominal pain with ascites. EXAM: ABDOMEN ULTRASOUND COMPLETE DUPLEX LIVER ULTRASOUND TECHNIQUE: Grayscale and color ultrasound of the abdomen was performed. Color and duplex Doppler ultrasound was also performed to evaluate the hepatic in-flow and out-flow vessels. COMPARISON:  CT abdomen pelvis dated April 19, 2017. FINDINGS: Gallbladder: Surgically absent. Common bile duct: Diameter: 3 mm, normal Liver: No focal lesion identified. Increased in parenchymal  echogenicity. Portal vein is patent on color Doppler imaging with normal direction of blood flow towards the liver. IVC: No abnormality visualized. Pancreas: Visualized portion unremarkable. Spleen: Surgically absent. Right Kidney: Length: 10.4 cm. Echogenicity within normal limits. No mass or hydronephrosis visualized. Left Kidney: Length: 9.2 cm. Echogenicity within normal limits. No mass or hydronephrosis visualized. Abdominal aorta: No aneurysm visualized. Other findings: None. Portal Vein Velocities Main: 58.2 cm/sec, hepatopetal Right: 16.4 cm/sec, hepatopetal Left: 25.5 cm/sec, hepatopetal Hepatic Vein Velocities Right: 26.5 cm/sec, hepatofugal Middle: 52.3 cm/sec, hepatofugal Left: 36.5 cm/sec, hepatofugal Hepatic Artery Velocity: 50.4 cm/sec Varices: Absent. Ascites: Trace perihepatic ascites. IMPRESSION: 1. Increased hepatic parenchymal echogenicity as can be seen with liver disease. No focal abnormality. Trace perihepatic ascites. 2. Normal velocities and direction of flow within the hepatic vessels. Electronically Signed   By: Titus Dubin M.D.   On: 04/25/2017 14:20    Microbiology: No results found for this or any previous visit (from the past 240 hour(s)).   Labs: Basic Metabolic Panel: Recent Labs  Lab 05/12/17 1000 05/13/17 0346 05/14/17 1644 05/16/17 0437 05/17/17 0534  NA 139 136 140 140 138  K 3.3* 3.8 4.8 4.8 4.6  CL 107 108 112* 114* 115*  CO2 24 22 21* 20* 20*  GLUCOSE 93 120* 102* 101* 101*  BUN 16 14 17  23* 20  CREATININE 2.34* 2.26* 2.41* 2.22* 2.13*  CALCIUM 7.6* 7.5* 7.9* 7.8* 8.0*   Liver Function Tests: Recent Labs  Lab 05/11/17 1449 05/12/17 1000 05/13/17 0346 05/14/17 1644  AST 47* 45* 37 40  ALT 35 30 26 26   ALKPHOS 279* 229* 196* 232*  BILITOT 0.6 0.8 0.4 0.8  PROT 4.5* 5.0* 4.3* 4.9*  ALBUMIN 1.6* 1.4* 1.2* 1.3*   No results for input(s): LIPASE, AMYLASE in the last 168 hours. No results for input(s): AMMONIA in the last 168  hours. CBC: Recent Labs  Lab 05/11/17 1449 05/12/17 1000 05/13/17 0346 05/14/17 1644 05/16/17 1025 05/17/17 0534  WBC 19.5* 18.4* 19.8* 16.7* 15.2* 18.8*  NEUTROABS 9.8* 10.4*  --  8.6* 8.1* 7.1  HGB 4.9* 7.0* 6.0* 7.7* 7.2* 7.5*  HCT 15.3* 21.3* 18.3* 23.8* 22.2* 23.4*  MCV 92 91.4 92.0 90.8 91.4 91.4  PLT 805* 641* 552* 491* 474* 473*   Cardiac Enzymes: No results for input(s): CKTOTAL, CKMB, CKMBINDEX, TROPONINI in the last 168 hours. BNP: Invalid input(s): POCBNP CBG: No results for input(s): GLUCAP in the last 168 hours.  Time coordinating discharge: 50 minutes  Signed:  [facility] 05/18/2017, 12:54 PM

## 2017-05-24 DIAGNOSIS — R600 Localized edema: Secondary | ICD-10-CM | POA: Diagnosis not present

## 2017-05-24 DIAGNOSIS — R6889 Other general symptoms and signs: Secondary | ICD-10-CM | POA: Diagnosis not present

## 2017-05-24 DIAGNOSIS — R809 Proteinuria, unspecified: Secondary | ICD-10-CM | POA: Diagnosis not present

## 2017-05-24 DIAGNOSIS — E559 Vitamin D deficiency, unspecified: Secondary | ICD-10-CM | POA: Diagnosis not present

## 2017-05-24 DIAGNOSIS — R51 Headache: Secondary | ICD-10-CM | POA: Diagnosis not present

## 2017-05-24 DIAGNOSIS — D571 Sickle-cell disease without crisis: Secondary | ICD-10-CM | POA: Diagnosis not present

## 2017-05-24 DIAGNOSIS — R0602 Shortness of breath: Secondary | ICD-10-CM | POA: Diagnosis not present

## 2017-05-27 NOTE — Discharge Summary (Signed)
Physician Discharge Summary  [name] [name]. GYF:[id number] DOB: June 01, 1980 DOA: 04/07/2017  PCP: [name], FNP  Admit date: 04/07/2017  Discharge date: Patient Left AMA  Discharge Diagnoses:  Principal Problem:   Colitis Active Problems:   Sickle cell disease, type SS.    SICKLE CELL CRISIS   Chronic kidney disease   Leucocytosis   Anemia   Sickle cell nephropathy (HCC)   Hyponatremia  Discharge Condition: Left AMA  Diet: Regular  History of present illness:  [name] [name]. is a 37 y.o. 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  Patient is a poor historian so most of the  information were taken from his mother. As per the mother, he was having right flank pain that was worsening and  he had to be brought to the emergency department today.  She also reported that patient has had several loose stools since last 2 days.  He was also vomiting.  Patient and the mother denies any fever or chills. CT stone study was ordered in the emergency department showed colitis involving the ascending and transverse colon Patient has history of sickle cell disease. He was admitted here on 02/27/17 and was discharged on 03/04/17 when he was treated for acute chest syndrome.  Patient follows with Dr. Zigmund Daniel when he is admitted here but he does not have regular follow-up at sickle cell clinic. Family is trying to arrange follow-up at [facility]. Patient is on folic acid at home but not on hydroxyurea.  ED Course: Patient was treated with IV fluids and given a dose of Zosyn .He was given multiple doses of Dilaudid.  Hospital Course:  Patient was admitted for Colitis and Sickle Cell Pain Crisis. He was started on standard sickle cell pain management protocol individualized with  IVF, IV Dilaudid via PCA, weight-based, and other adjunct therapies, patient unable to receive Toradol due to CKD. He was also started on antibiotics IV for Acute Colitis diagnosed via CT abdomen. He showed some improvement but was uncooperative with management protocol, he refused to wear his oxygen mask, he became angry with his diet being restricted temporarily to clear liquids because of his Colitis, claiming he has to eat "real food". His PCA was turned off because he would not wear oxygen mask to allow for proper monitoring, although his Hb dropped to below his baseline, patient left the hospital against medical advise before any medical intervention.  Last Vitals Before Patient Left AMA: Vitals:   04/08/17 2140 04/09/17 0541  BP:  100/65  Pulse:  93  Resp: 20 16  Temp:  98.3 F (36.8 C)  SpO2:  92%   Vitals:   04/08/17 1640 04/08/17 2011 04/08/17 2140 04/09/17 0541  BP:  123/66  100/65  Pulse:    93  Resp: 17 18 20 16   Temp:  99.7 F (37.6 C)  98.3 F (36.8 C)  TempSrc:  Oral  Oral  SpO2: 93% 95%  92%  Weight:      Height:       Discharge Instructions  Discharge Instructions    Diet - low sodium heart healthy   Complete by:  As directed    Increase activity slowly   Complete by:  As directed      Allergies as of 04/09/2017  Reactions   Nsaids    Morphine And Related Other (See Comments)   "real bad headaches"      Medication List    TAKE these medications   folic acid 1 MG tablet Commonly known as:  FOLVITE Take 1 tablet (1 mg total) daily by mouth.   nortriptyline 25 MG capsule Commonly known as:  PAMELOR TAKE 1 CAPSULE (25 MG TOTAL) BY MOUTH AT BEDTIME.   sodium bicarbonate 650 MG tablet Take 1 tablet (650 mg total) 3 (three) times daily by mouth.      The results of significant diagnostics from this hospitalization (including imaging, microbiology, ancillary and laboratory) are listed below for reference.    Significant Diagnostic Studies: Dg  Chest 2 View  Result Date: 05/12/2017 CLINICAL DATA:  Acute chest pain today. Patient with sickle cell disease. EXAM: CHEST - 2 VIEW COMPARISON:  04/24/2017 and prior radiographs FINDINGS: UPPER limits normal heart size and increased pulmonary vascularity noted. Mild chronic streaky pulmonary opacities within the LOWER lungs noted. No acute airspace disease, pleural effusion, consolidation or mass noted. No acute bony abnormalities are identified. IMPRESSION: UPPER limits normal heart size without evidence of acute cardiopulmonary disease. Electronically Signed   By: Margarette Canada M.D.   On: 05/12/2017 11:38   US Abdomen Limited  Result Date: 05/02/2017 CLINICAL DATA:  Left lower quadrant pain.  Sickle cell disease. EXAM: ULTRASOUND ABDOMEN LIMITED COMPARISON:  None. FINDINGS: Specific evaluation of the left lower quadrant was requested. There is no evidence of mass or focal fluid collection in the region. One could question mild edema the superficial subcutaneous tissues which is nonspecific. IMPRESSION: No left lower quadrant abnormality identified internally. One could question mild nonspecific edema the left lower quadrant subcutaneous fat. Electronically Signed   By: Nelson Chimes M.D.   On: 05/02/2017 09:55   Dg Chest Port 1 View  Result Date: 05/15/2017 CLINICAL DATA:  Central line placement EXAM: PORTABLE CHEST 1 VIEW COMPARISON:  Chest x-ray of 05/12/2017 FINDINGS: Right central venous line is present with tip overlying the lower SVC near the expected right atrial junction. Linear atelectasis or scarring remains at the left lung base. No pneumonia or effusion is seen. Mediastinal and hilar contours are stable and the heart size is stable. There are degenerative changes noted in the right shoulder which may indicate avascular necrosis of the right humeral head. IMPRESSION: 1. Right central line placement tip overlies the lower SVC just above the expected right atrial junction. No pneumothorax. 2.  Linear atelectasis or scarring at the left lung base. 3. Abnormality the right humeral head.  Question AVN. Electronically Signed   By: Ivar Drape M.D.   On: 05/15/2017 11:32    Signed:  [facility] 05/27/2017, 9:00 AM

## 2017-05-29 ENCOUNTER — Encounter (HOSPITAL_COMMUNITY): Payer: Self-pay | Admitting: Family Medicine

## 2017-05-29 ENCOUNTER — Ambulatory Visit (HOSPITAL_COMMUNITY)
Admission: EM | Admit: 2017-05-29 | Discharge: 2017-05-29 | Disposition: A | Discharge status: Home / Self Care | Payer: Medicare Other | Attending: Family Medicine | Admitting: Family Medicine

## 2017-05-29 DIAGNOSIS — M25572 Pain in left ankle and joints of left foot: Secondary | ICD-10-CM | POA: Diagnosis not present

## 2017-05-29 DIAGNOSIS — R000 Tachycardia, unspecified: Secondary | ICD-10-CM | POA: Diagnosis not present

## 2017-05-29 DIAGNOSIS — D571 Sickle-cell disease without crisis: Secondary | ICD-10-CM | POA: Diagnosis not present

## 2017-05-29 DIAGNOSIS — M545 Low back pain: Secondary | ICD-10-CM | POA: Diagnosis not present

## 2017-05-29 DIAGNOSIS — R51 Headache: Secondary | ICD-10-CM | POA: Diagnosis not present

## 2017-05-29 DIAGNOSIS — R7989 Other specified abnormal findings of blood chemistry: Secondary | ICD-10-CM

## 2017-05-29 DIAGNOSIS — Z87891 Personal history of nicotine dependence: Secondary | ICD-10-CM | POA: Diagnosis not present

## 2017-05-29 DIAGNOSIS — M25571 Pain in right ankle and joints of right foot: Secondary | ICD-10-CM | POA: Diagnosis not present

## 2017-05-29 DIAGNOSIS — M7989 Other specified soft tissue disorders: Secondary | ICD-10-CM | POA: Diagnosis not present

## 2017-05-29 DIAGNOSIS — N08 Glomerular disorders in diseases classified elsewhere: Secondary | ICD-10-CM | POA: Diagnosis not present

## 2017-05-29 DIAGNOSIS — R609 Edema, unspecified: Secondary | ICD-10-CM | POA: Diagnosis not present

## 2017-05-29 DIAGNOSIS — N183 Chronic kidney disease, stage 3 (moderate): Secondary | ICD-10-CM | POA: Diagnosis not present

## 2017-05-29 DIAGNOSIS — D5700 Hb-SS disease with crisis, unspecified: Secondary | ICD-10-CM | POA: Diagnosis not present

## 2017-05-29 LAB — POCT I-STAT, CHEM 8
BUN: 21 mg/dL — ABNORMAL HIGH (ref 6–20)
CALCIUM ION: 1.17 mmol/L (ref 1.15–1.40)
CREATININE: 7.7 mg/dL — AB (ref 0.61–1.24)
Chloride: 113 mmol/L — ABNORMAL HIGH (ref 101–111)
GLUCOSE: 155 mg/dL — AB (ref 65–99)
HCT: 24 % — ABNORMAL LOW (ref 39.0–52.0)
HEMOGLOBIN: 8.2 g/dL — AB (ref 13.0–17.0)
POTASSIUM: 5 mmol/L (ref 3.5–5.1)
Sodium: 137 mmol/L (ref 135–145)
TCO2: 18 mmol/L — AB (ref 22–32)

## 2017-05-29 NOTE — ED Provider Notes (Signed)
[address]   [id number] 05/29/17 Arrival Time: 31   SUBJECTIVE:  [name] Heinrich Avier Jech. is a 37 y.o. male who presents to the urgent care with complaint of dizziness. He recently started on hydroxyurea and vasotec for sickle cell and they sent him here for check of Hgb, creatinine and BP. He goes to the sickle cell clinic in [place].   No ear pain or loss of hearing  S/P cholecystectomy and splenectomy   Past Medical History:  Diagnosis Date  . Arachnoid Cyst of brain bilaterally    "2 really small ones in the back of my head; inside; saw them w/MRI" (09/25/2012)  . Bacterial pneumonia ~ 2012   "caught it here in the hospital" (09/25/2012)  . Chronic kidney disease    "from my sickle cell" (09/25/2012)  . CKD (chronic kidney disease), stage II   . GERD (gastroesophageal reflux disease)    "after I eat alot of spicey foods" (09/25/2012)  . Gynecomastia, male 07/10/2012  . History of blood transfusion    "always related to sickle cell crisis" (09/25/2012)  . 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   Noted in noted from Hematology [facility] at [facility]  . Migraines    "take RX qd to prevent them" (09/25/2012)  . Sickle cell anemia (HCC)   . Sickle cell crisis ([place]) 09/25/2012  . Sickle cell nephropathy ([place]) 07/10/2012  . Sinus tachycardia   . Tachycardia with heart rate 121-140 beats per minute with ambulation 08/04/2016   Family History  Problem Relation Age of Onset  . Breast cancer Mother    Social History   Socioeconomic History  . Marital status: Single    Spouse name: Not on file  . Number of children: Not on file  . Years of education: Not on file  . Highest education level: Not on file  Occupational History  . Not on file  Social Needs  . Financial resource strain: Not on file  . Food insecurity:    Worry: Not on file    Inability: Not on file  . Transportation needs:    Medical: Not on file    Non-medical: Not on file  Tobacco Use  .  Smoking status: Current Some Day Smoker    Types: Cigarettes  . Smokeless tobacco: Never Used  . Tobacco comment: 09/25/2012 "I don't buy cigarettes; bum one from friends q now and then"  Substance and Sexual Activity  . Alcohol use: No    Alcohol/week: 0.0 oz    Frequency: Never  . Drug use: No    Types: Marijuana    Comment: 09/25/2012 "weed was my biggest problem; stopped that ~ 1 1/2 months ago; I don't do cocaine; can't say I never have though"  . Sexual activity: Yes  Lifestyle  . Physical activity:    Days per week: Not on file    Minutes per session: Not on file  . Stress: Not on file  Relationships  . Social connections:    Talks on phone: Not on file    Gets together: Not on file    Attends religious service: Not on file    Active member of club or organization: Not on file    Attends meetings of clubs or organizations: Not on file    Relationship status: Not on file  . Intimate partner violence:    Fear of current or ex partner: Not on file    Emotionally abused: Not on file    Physically abused: Not on  file    Forced sexual activity: Not on file  Other Topics Concern  . Not on file  Social History Narrative    Lives alone in [place].   Back at school, studying Public relations account executive. Unemployed.    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    Current Meds  Medication Sig  . enalapril (VASOTEC) 5 MG tablet Take 5 mg by mouth daily.  . hydroxyurea (HYDREA) 500 MG capsule Take 500 mg by mouth daily. May take with food to minimize GI side effects.   Allergies  Allergen Reactions  . Nsaids   . Morphine And Related Other (See Comments)    "real bad headaches"      ROS: As per HPI, remainder of ROS negative.   OBJECTIVE:   Vitals:   05/29/17 1223  BP: (!) 104/59  Pulse: (!) 117  Resp: 18  Temp: 98.3 F (36.8 C)    SpO2: 100%     General appearance: alert; no distress Eyes: PERRL; EOMI; conjunctiva normal HENT: normocephalic; atraumatic; TMs normal, canal normal, external ears normal without trauma; nasal mucosa normal; oral mucosa normal Neck: supple Lungs: clear to auscultation bilaterally Heart: regular rate and rhythm Abdomen: soft, non-tender; bowel sounds normal; no masses or organomegaly; no guarding or rebound tenderness; smooth liver edge 2 cm below costal margin with deep breath. Back: no CVA tenderness Extremities: no cyanosis or edema; symmetrical with no gross deformities Skin: warm and dry Neurologic: normal gait; grossly normal Psychological: alert and cooperative; normal mood and affect      Labs:  Results for orders placed or performed during the hospital encounter of 05/29/17  I-STAT, chem 8  Result Value Ref Range   Sodium 137 135 - 145 mmol/L   Potassium 5.0 3.5 - 5.1 mmol/L   Chloride 113 (H) 101 - 111 mmol/L   BUN 21 (H) 6 - 20 mg/dL   Creatinine, Ser 7.70 (H) 0.61 - 1.24 mg/dL   Glucose, Bld 155 (H) 65 - 99 mg/dL   Calcium, Ion 1.17 1.15 - 1.40 mmol/L   TCO2 18 (L) 22 - 32 mmol/L   Hemoglobin 8.2 (L) 13.0 - 17.0 g/dL   HCT 24.0 (L) 39.0 - 52.0 %    Labs Reviewed  POCT I-STAT, CHEM 8 - Abnormal; Notable for the following components:      Result Value   Chloride 113 (*)    BUN 21 (*)    Creatinine, Ser 7.70 (*)    Glucose, Bld 155 (*)    TCO2 18 (*)    Hemoglobin 8.2 (*)    HCT 24.0 (*)    All other components within normal limits    No results found.     ASSESSMENT & PLAN:  1. Hb-SS disease without crisis ([place])   2. Azotemia   Your blood pressure is fine at 104/59  Your kidney function has significantly worsened.  Stop the vasotec and wait for a call from [name] [name]. You need to see a nephrologist (kidney doctor) as soon as possible  Your hemoglobin is stable.  No orders of the defined types were placed in this encounter.   Reviewed  expectations re: course of current medical issues. Questions answered. Outlined signs and symptoms indicating need for more acute intervention. Patient verbalized understanding. After Visit Summary given.    Procedures:  Robyn Haber, MD 05/29/17 1346

## 2017-05-29 NOTE — Discharge Instructions (Addendum)
Your blood pressure is fine at 104/59  Your kidney function has significantly worsened.  Stop the vasotec and wait for a call from Hal Morales. You need to see a nephrologist (kidney doctor) as soon as possible  Your hemoglobin is stable.

## 2017-05-29 NOTE — ED Triage Notes (Addendum)
Pt here for dizziness. He recently started on hydroxyurea and vasotec sickle cell and they sent him here for check of Hgb, creatinine and BP. He goes to the sickle cell clinic in [place].

## 2017-05-30 DIAGNOSIS — N08 Glomerular disorders in diseases classified elsewhere: Secondary | ICD-10-CM | POA: Diagnosis present

## 2017-05-30 DIAGNOSIS — D571 Sickle-cell disease without crisis: Secondary | ICD-10-CM | POA: Diagnosis not present

## 2017-05-30 DIAGNOSIS — Z79891 Long term (current) use of opiate analgesic: Secondary | ICD-10-CM | POA: Diagnosis not present

## 2017-05-30 DIAGNOSIS — Z87891 Personal history of nicotine dependence: Secondary | ICD-10-CM | POA: Diagnosis not present

## 2017-05-30 DIAGNOSIS — R000 Tachycardia, unspecified: Secondary | ICD-10-CM | POA: Diagnosis not present

## 2017-05-30 DIAGNOSIS — N183 Chronic kidney disease, stage 3 (moderate): Secondary | ICD-10-CM | POA: Diagnosis not present

## 2017-05-30 DIAGNOSIS — D5700 Hb-SS disease with crisis, unspecified: Secondary | ICD-10-CM | POA: Diagnosis present

## 2017-05-30 DIAGNOSIS — M25572 Pain in left ankle and joints of left foot: Secondary | ICD-10-CM | POA: Diagnosis not present

## 2017-05-30 DIAGNOSIS — M545 Low back pain: Secondary | ICD-10-CM | POA: Diagnosis not present

## 2017-05-30 DIAGNOSIS — R51 Headache: Secondary | ICD-10-CM | POA: Diagnosis present

## 2017-05-30 DIAGNOSIS — M7989 Other specified soft tissue disorders: Secondary | ICD-10-CM | POA: Diagnosis not present

## 2017-05-30 DIAGNOSIS — R609 Edema, unspecified: Secondary | ICD-10-CM | POA: Diagnosis present

## 2017-05-30 DIAGNOSIS — M25571 Pain in right ankle and joints of right foot: Secondary | ICD-10-CM | POA: Diagnosis not present

## 2017-06-09 ENCOUNTER — Other Ambulatory Visit: Payer: Self-pay | Admitting: Internal Medicine

## 2017-06-11 ENCOUNTER — Ambulatory Visit: Payer: Medicare Other | Admitting: Family Medicine

## 2017-06-13 ENCOUNTER — Ambulatory Visit (INDEPENDENT_AMBULATORY_CARE_PROVIDER_SITE_OTHER): Payer: Medicare Other | Admitting: Family Medicine

## 2017-06-13 ENCOUNTER — Other Ambulatory Visit: Payer: Self-pay | Admitting: Family Medicine

## 2017-06-13 ENCOUNTER — Encounter: Payer: Self-pay | Admitting: Family Medicine

## 2017-06-13 VITALS — BP 125/74 | HR 128 | Resp 14 | Ht 63.0 in | Wt 124.0 lb

## 2017-06-13 DIAGNOSIS — D571 Sickle-cell disease without crisis: Secondary | ICD-10-CM | POA: Diagnosis not present

## 2017-06-13 DIAGNOSIS — G894 Chronic pain syndrome: Secondary | ICD-10-CM | POA: Diagnosis not present

## 2017-06-13 DIAGNOSIS — R82998 Other abnormal findings in urine: Secondary | ICD-10-CM

## 2017-06-13 DIAGNOSIS — Z23 Encounter for immunization: Secondary | ICD-10-CM | POA: Diagnosis not present

## 2017-06-13 DIAGNOSIS — N08 Glomerular disorders in diseases classified elsewhere: Secondary | ICD-10-CM

## 2017-06-13 LAB — POCT URINALYSIS DIPSTICK
Bilirubin, UA: NEGATIVE
Blood, UA: NEGATIVE
Glucose, UA: NEGATIVE
KETONES UA: 15
NITRITE UA: NEGATIVE
PROTEIN UA: NEGATIVE
SPEC GRAV UA: 1.015 (ref 1.010–1.025)
Urobilinogen, UA: 0.2 E.U./dL
pH, UA: 7 (ref 5.0–8.0)

## 2017-06-13 MED ORDER — OXYCODONE HCL ER 15 MG PO T12A: 15.0000 mg | EXTENDED_RELEASE_TABLET | Freq: Two times a day (BID) | ORAL | 0 refills | Status: DC

## 2017-06-13 MED ORDER — OXYCODONE HCL 10 MG PO TABS: 10.0000 mg | ORAL_TABLET | Freq: Four times a day (QID) | ORAL | 0 refills | Status: DC | PRN

## 2017-06-13 NOTE — Patient Instructions (Signed)
We will follow-up by phone with any abnormal laboratory results. We will start opiate medication routines, since you are opiate tolerant will start oxycodone 10 mg every 6 hours as needed for moderate to severe pain.  We will also continue long-acting medication, OxyContin 15 mg every 12 hours daily.  Refrain from drinking alcohol, driving, or operating machinery while taking these medications. I will defer to nephrology for daily water intake.  However, remain hydrated.  Avoid stressors that trigger sickle cell crisis for example temperature changes or infection.  So, make sure that you are washing your hands consistently.  I also recommend a balanced diet divided over several meals throughout the day.  Now

## 2017-06-13 NOTE — Progress Notes (Signed)
Chief Complaint  Patient presents with  . Sickle Cell Anemia  . Medication Refill    pain medication      Subjective:    Patient ID: [name] Meyer., male    DOB: 06-15-1980, 37 y.o.   MRN: [id number]  HPI [name] [name], a 37 year old male history of sickle cell anemia, hemoglobin SS, chronic kidney disease and chronic pain presents for a follow up of sickle cell anemia. Patient says that he is out of opiate medications.  Patient typically takes oxycodone 10 mg every 4 hours as needed for moderate-to-severe pain.  Current pain intensity is 7-8/10 characterized as intermittent and throbbing. Patient says that he has been out of opiate medication over the past several dys.  He has not been followed by hematology. He refrains from taking NSAIDs due to stage III chronic kidney disease.  Patient is followed by Dr. Erling Cruz, nephrology. He says that he continues to have a difficult time following renal diet, he mostly eats fast foods.  He currently denies shortness of breath, headache, blurred vision, paresthesias, chest pain, nausea, vomiting, or diarrhea.  Past Medical History:  Diagnosis Date  . Arachnoid Cyst of brain bilaterally    "2 really small ones in the back of my head; inside; saw them w/MRI" (09/25/2012)  . Bacterial pneumonia ~ 2012   "caught it here in the hospital" (09/25/2012)  . Chronic kidney disease    "from my sickle cell" (09/25/2012)  . CKD (chronic kidney disease), stage II   . GERD (gastroesophageal reflux disease)    "after I eat alot of spicey foods" (09/25/2012)  . Gynecomastia, male 07/10/2012  . History of blood transfusion    "always related to sickle cell crisis" (09/25/2012)  . 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   Noted in noted from Hematology [facility] at [facility]  . Migraines    "take RX qd to prevent them" (09/25/2012)  . Sickle cell anemia (HCC)   . Sickle cell crisis ([place]) 09/25/2012  . Sickle cell nephropathy ([place]) 07/10/2012  .  Sinus tachycardia   . Tachycardia with heart rate 121-140 beats per minute with ambulation 08/04/2016   Social History   Socioeconomic History  . Marital status: Single    Spouse name: Not on file  . Number of children: Not on file  . Years of education: Not on file  . Highest education level: Not on file  Occupational History  . Not on file  Social Needs  . Financial resource strain: Not on file  . Food insecurity:    Worry: Not on file    Inability: Not on file  . Transportation needs:    Medical: Not on file    Non-medical: Not on file  Tobacco Use  . Smoking status: Current Some Day Smoker    Types: Cigarettes  . Smokeless tobacco: Never Used  . Tobacco comment: 09/25/2012 "I don't buy cigarettes; bum one from friends q now and then"  Substance and Sexual Activity  . Alcohol use: No    Alcohol/week: 0.0 oz    Frequency: Never  . Drug use: No    Types: Marijuana    Comment: 09/25/2012 "weed was my biggest problem; stopped that ~ 1 1/2 months ago; I don't do cocaine; can't say I never have though"  . Sexual activity: Yes  Lifestyle  . Physical activity:    Days per week: Not on file    Minutes per session: Not on file  . Stress: Not on file  Relationships  . Social connections:    Talks on phone: Not on file    Gets together: Not on file    Attends religious service: Not on file    Active member of club or organization: Not on file    Attends meetings of clubs or organizations: Not on file    Relationship status: Not on file  . Intimate partner violence:    Fear of current or ex partner: Not on file    Emotionally abused: Not on file    Physically abused: Not on file    Forced sexual activity: Not on file  Other Topics Concern  . Not on file  Social History Narrative    Lives alone in [place].   Back at school, studying Public relations account executive. Unemployed.    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    Immunization History  Administered Date(s) Administered  . Influenza Split 12/08/2011  . Influenza,inj,Quad PF,6+ Mos 10/11/2012, 12/13/2016  . Pneumococcal Polysaccharide-23 12/13/2016  . Td 09/18/2004   Review of Systems  HENT: Negative.   Eyes: Negative.  Negative for photophobia, redness and visual disturbance.  Respiratory: Negative.  Negative for cough and shortness of breath.   Cardiovascular: Negative.  Negative for chest pain, palpitations and leg swelling.  Gastrointestinal: Negative.   Endocrine: Negative.  Negative for polydipsia, polyphagia and polyuria.  Genitourinary: Negative.   Musculoskeletal: Positive for arthralgias and back pain.  Skin: Negative.   Allergic/Immunologic: Negative.   Neurological: Negative.  Negative for syncope, numbness and headaches.  Hematological: Negative.   Psychiatric/Behavioral: Negative.        Objective:   Physical Exam  Constitutional: He is oriented to person, place, and time. He appears well-developed and well-nourished.  HENT:  Head: Normocephalic and atraumatic.  Right Ear: External ear normal.  Left Ear: External ear normal.  Nose: Nose normal.  Mouth/Throat: Oropharynx is clear and moist.  Eyes: Pupils are equal, round, and reactive to light.  Neck: Normal range of motion. Neck supple.  Cardiovascular: Normal rate, regular rhythm, normal heart sounds and intact distal pulses.  Pulmonary/Chest: Effort normal and breath sounds normal.  Abdominal: Soft. Bowel sounds are normal. There is no tenderness. There is no rebound.  Musculoskeletal:       Lumbar back: He exhibits decreased range of motion, tenderness and pain. He exhibits no spasm.  Neurological: He is alert and oriented to person, place, and time. He has normal reflexes.  Skin: Skin is warm and dry.  Psychiatric: He has a normal mood and affect. His behavior is normal. Judgment and  thought content normal.      BP 125/74 (BP Location: Right Arm, Patient Position: Sitting, Cuff Size: Normal)   Pulse (!) 128   Resp 14   Ht 5\' 3"  (1.6 m)   Wt 124 lb (56.2 kg)   SpO2 99%   BMI 21.97 kg/m  Assessment & Plan:  1. Sickle cell with hemoglobin c anemia, without crisis ([place]) Will start folic acid 1 mg daily to prevent aplastic bone marrow crises.    Pulmonary evaluation - Patient denies severe recurrent wheezes, shortness of breath with exercise, or persistent cough. If these symptoms develop, pulmonary function tests with spirometry will be ordered, and if abnormal, plan on referral to Pulmonology for further evaluation.  Cardiac - Reviewed previous EKG  Eyes - High  risk of proliferative retinopathy. Annual eye exam with retinal exam recommended to patient. [name] [name] has not had a recent eye exam.    Immunization status - Patient up to date with pneumococcal vaccination. Will also review [facility].   Acute and chronic painful episodes - Reviewed the [place] reporting system, no inconsistencies noted. Patient is currently on 58 MME/day. We agreed on continuing Oxycodone 10 mg every 6 hours for moderate to severe pain. Patient says that he has around 30 tablets left. [name] [name] and I discussed prescription refill policy, he expressed understanding. We also discussed that he is encouraged to choose pharmacy and receive schedule II medications only from this clinic. [name] [name] had a number of questions and concerns. He is also aware that the prescription history is available to [place] online through the [place]. Controlled substance agreement signed today. We reminded [name] [name] that all patients receiving Schedule II narcotics must be seen for follow within one month of prescription being requested. We reviewed the terms of our pain agreement, including the need to keep medicines in a safe locked location away from children or pets, and the need to report excess sedation or constipation, measures to avoid constipation, and  policies related to early refills and stolen prescriptions. According to the [place] Chronic Pain Initiative program, we have reviewed details related to analgesia, adverse effects, aberrant behaviors.  - Urinalysis Dipstick - oxyCODONE (OXYCONTIN) 15 mg 12 hr tablet; Take 1 tablet (15 mg total) by mouth every 12 (twelve) hours.  Dispense: 60 tablet; Refill: 0 - Oxycodone HCl 10 MG TABS; Take 1 tablet (10 mg total) by mouth every 6 (six) hours as needed.  Dispense: 60 tablet; Refill: 0 - [id number] 9+OXYCODONE+CRT-UNBUND; Future - [id number] 9+OXYCODONE+CRT-UNBUND - CBC with Differential - Comprehensive metabolic panel  Chronic pain syndrome - oxyCODONE (OXYCONTIN) 15 mg 12 hr tablet; Take 1 tablet (15 mg total) by mouth every 12 (twelve) hours.  Dispense: 60 tablet; Refill: 0 - Oxycodone HCl 10 MG TABS; Take 1 tablet (10 mg total) by mouth every 6 (six) hours as needed.  Dispense: 60 tablet; Refill: 0 - [id number] 9+OXYCODONE+CRT-UNBUND; Future - [id number] 9+OXYCODONE+CRT-UNBUND - Oxycodone/Oxymorphone Confirm  Urine leukocytes - Urine Culture  Need for Tdap vaccination - Tdap vaccine greater than or equal to 7yo IM   RTC: Continue to follow up with Dr. Florene Glen as scheduled for CKD3 and f/u in clinic in 1 month for SCA and medication management   [name]  MSN, FNP-C [name] [address] [place],  [address] [phone]

## 2017-06-14 ENCOUNTER — Telehealth: Payer: Self-pay

## 2017-06-14 LAB — CBC WITH DIFFERENTIAL/PLATELET
Basophils Absolute: 0 10*3/uL (ref 0.0–0.2)
Basos: 0 %
EOS (ABSOLUTE): 0.2 10*3/uL (ref 0.0–0.4)
EOS: 2 %
HEMOGLOBIN: 7.3 g/dL — AB (ref 13.0–17.7)
Hematocrit: 22.5 % — ABNORMAL LOW (ref 37.5–51.0)
Immature Grans (Abs): 0 10*3/uL (ref 0.0–0.1)
Immature Granulocytes: 0 %
LYMPHS ABS: 4.4 10*3/uL — AB (ref 0.7–3.1)
Lymphs: 45 %
MCH: 31.2 pg (ref 26.6–33.0)
MCHC: 32.4 g/dL (ref 31.5–35.7)
MCV: 96 fL (ref 79–97)
MONOS ABS: 0.2 10*3/uL (ref 0.1–0.9)
Monocytes: 2 %
Neutrophils Absolute: 4.9 10*3/uL (ref 1.4–7.0)
Neutrophils: 51 %
Platelets: 455 10*3/uL — ABNORMAL HIGH (ref 150–379)
RBC: 2.34 x10E6/uL — CL (ref 4.14–5.80)
RDW: 23.9 % — ABNORMAL HIGH (ref 12.3–15.4)
WBC: 9.8 10*3/uL (ref 3.4–10.8)

## 2017-06-14 LAB — COMPREHENSIVE METABOLIC PANEL
ALBUMIN: 2.2 g/dL — AB (ref 3.5–5.5)
ALK PHOS: 274 IU/L — AB (ref 39–117)
ALT: 24 IU/L (ref 0–44)
AST: 43 IU/L — ABNORMAL HIGH (ref 0–40)
Albumin/Globulin Ratio: 1 — ABNORMAL LOW (ref 1.2–2.2)
BUN / CREAT RATIO: 9 (ref 9–20)
BUN: 18 mg/dL (ref 6–20)
Bilirubin Total: 0.5 mg/dL (ref 0.0–1.2)
CO2: 18 mmol/L — AB (ref 20–29)
CREATININE: 2.02 mg/dL — AB (ref 0.76–1.27)
Calcium: 8 mg/dL — ABNORMAL LOW (ref 8.7–10.2)
Chloride: 108 mmol/L — ABNORMAL HIGH (ref 96–106)
GFR calc Af Amer: 48 mL/min/{1.73_m2} — ABNORMAL LOW (ref >59)
GFR calc non Af Amer: 42 mL/min/{1.73_m2} — ABNORMAL LOW (ref >59)
GLOBULIN, TOTAL: 2.3 g/dL (ref 1.5–4.5)
Glucose: 77 mg/dL (ref 65–99)
Potassium: 5 mmol/L (ref 3.5–5.2)
Sodium: 138 mmol/L (ref 134–144)
Total Protein: 4.5 g/dL — ABNORMAL LOW (ref 6.0–8.5)

## 2017-06-14 NOTE — Telephone Encounter (Signed)
-----   Message from [name], [place] sent at 06/14/2017  1:23 PM EDT ----- Regarding: lab results Please inform patient that creatinine level has normalized since hospital discharge.  Please fax results to Dr. Florene Glen at [facility].  Advised patient to follow-up in office as scheduled.  [name]  MSN, FNP-C Patient [facility] [address] [place], [place] [address] [phone]

## 2017-06-14 NOTE — Telephone Encounter (Signed)
Called and spoke with patient, advised that creatinine level has normalized since hospital discharge. Advised that we will send results to dr. Florene Glen at [facility]. Thanks!

## 2017-06-15 LAB — URINE CULTURE: Organism ID, Bacteria: NO GROWTH

## 2017-06-20 LAB — OPIATES CONFIRMATION, URINE: OPIATES: NEGATIVE ng/mL

## 2017-06-20 LAB — 737588 9+OXYCODONE+CRT-UNBUND
AMPHETAMINE SCREEN URINE: NEGATIVE ng/mL
BARBITURATE SCREEN URINE: NEGATIVE ng/mL
BENZODIAZEPINE SCREEN, URINE: NEGATIVE ng/mL
CANNABINOIDS UR QL SCN: NEGATIVE ng/mL
Cocaine (Metab) Scrn, Ur: NEGATIVE ng/mL
Creatinine(Crt), U: 67.8 mg/dL (ref 20.0–300.0)
Methadone Screen, Urine: NEGATIVE ng/mL
Ph of Urine: 7.8 (ref 4.5–8.9)
Phencyclidine Qn, Ur: NEGATIVE ng/mL
Propoxyphene Scrn, Ur: NEGATIVE ng/mL

## 2017-06-20 LAB — OXYCODONE/OXYMORPHONE CONFIRM
OXYCODONE/OXYMORPH: POSITIVE — AB
OXYCODONE: >3000 ng/mL
OXYCODONE: POSITIVE — AB
OXYMORPHONE: NEGATIVE

## 2017-06-21 DIAGNOSIS — H5200 Hypermetropia, unspecified eye: Secondary | ICD-10-CM | POA: Diagnosis not present

## 2017-06-21 DIAGNOSIS — D571 Sickle-cell disease without crisis: Secondary | ICD-10-CM | POA: Diagnosis not present

## 2017-06-28 DIAGNOSIS — R809 Proteinuria, unspecified: Secondary | ICD-10-CM | POA: Diagnosis not present

## 2017-06-28 DIAGNOSIS — R600 Localized edema: Secondary | ICD-10-CM | POA: Diagnosis not present

## 2017-06-28 DIAGNOSIS — R0602 Shortness of breath: Secondary | ICD-10-CM | POA: Diagnosis not present

## 2017-06-28 DIAGNOSIS — R7989 Other specified abnormal findings of blood chemistry: Secondary | ICD-10-CM | POA: Diagnosis not present

## 2017-06-28 DIAGNOSIS — D571 Sickle-cell disease without crisis: Secondary | ICD-10-CM | POA: Diagnosis not present

## 2017-07-10 ENCOUNTER — Telehealth: Payer: Self-pay

## 2017-07-11 ENCOUNTER — Telehealth: Payer: Self-pay

## 2017-07-11 ENCOUNTER — Other Ambulatory Visit: Payer: Self-pay | Admitting: Family Medicine

## 2017-07-11 DIAGNOSIS — D571 Sickle-cell disease without crisis: Secondary | ICD-10-CM

## 2017-07-11 DIAGNOSIS — G894 Chronic pain syndrome: Secondary | ICD-10-CM

## 2017-07-11 DIAGNOSIS — N08 Glomerular disorders in diseases classified elsewhere: Principal | ICD-10-CM

## 2017-07-11 MED ORDER — OXYCODONE HCL 10 MG PO TABS: 10.0000 mg | ORAL_TABLET | Freq: Four times a day (QID) | ORAL | 0 refills | Status: DC | PRN

## 2017-07-11 MED ORDER — OXYCODONE HCL ER 15 MG PO T12A: 15.0000 mg | EXTENDED_RELEASE_TABLET | Freq: Two times a day (BID) | ORAL | 0 refills | Status: DC

## 2017-07-11 NOTE — Progress Notes (Signed)
Reviewed [place] Substance Reporting system prior to prescribing opiate medications. No inconsistencies noted.    Meds ordered this encounter  Medications  . DISCONTD: oxyCODONE (OXYCONTIN) 15 mg 12 hr tablet    Sig: Take 1 tablet (15 mg total) by mouth every 12 (twelve) hours.    Dispense:  60 tablet    Refill:  0    Rx not to be filled prior to 07/14/2017    Order Specific Question:   Supervising Provider    Answer:   [name] [[id number]]  . DISCONTD: Oxycodone HCl 10 MG TABS    Sig: Take 1 tablet (10 mg total) by mouth every 6 (six) hours as needed.    Dispense:  60 tablet    Refill:  0    Order Specific Question:   Supervising Provider    Answer:   [name] [id number]  . oxyCODONE (OXYCONTIN) 15 mg 12 hr tablet    Sig: Take 1 tablet (15 mg total) by mouth every 12 (twelve) hours.    Dispense:  60 tablet    Refill:  0    Rx not to be filled prior to 07/14/2017    Order Specific Question:   Supervising Provider    Answer:   [name] [[id number]]  . Oxycodone HCl 10 MG TABS    Sig: Take 1 tablet (10 mg total) by mouth every 6 (six) hours as needed.    Dispense:  60 tablet    Refill:  0    Order Specific Question:   Supervising Provider    Answer:   [name] [[id number]]     [name]  MSN, FNP-C Patient [facility] [address] [place], [place] [address] [phone]

## 2017-07-12 ENCOUNTER — Other Ambulatory Visit: Payer: Self-pay | Admitting: Internal Medicine

## 2017-07-16 ENCOUNTER — Encounter: Payer: Self-pay | Admitting: Family Medicine

## 2017-07-16 ENCOUNTER — Ambulatory Visit (INDEPENDENT_AMBULATORY_CARE_PROVIDER_SITE_OTHER): Payer: Medicare Other | Admitting: Family Medicine

## 2017-07-16 VITALS — BP 113/72 | HR 118 | Temp 99.3°F | Resp 16 | Ht 63.0 in | Wt 128.0 lb

## 2017-07-16 DIAGNOSIS — R000 Tachycardia, unspecified: Secondary | ICD-10-CM

## 2017-07-16 DIAGNOSIS — D5709 Hb-ss disease with crisis with other specified complication: Secondary | ICD-10-CM

## 2017-07-16 DIAGNOSIS — R011 Cardiac murmur, unspecified: Secondary | ICD-10-CM | POA: Diagnosis not present

## 2017-07-16 DIAGNOSIS — D5700 Hb-SS disease with crisis, unspecified: Secondary | ICD-10-CM

## 2017-07-16 DIAGNOSIS — N08 Glomerular disorders in diseases classified elsewhere: Secondary | ICD-10-CM

## 2017-07-16 LAB — POCT URINALYSIS DIPSTICK
Bilirubin, UA: NEGATIVE
Blood, UA: NEGATIVE
Glucose, UA: NEGATIVE
KETONES UA: 15
NITRITE UA: NEGATIVE
PH UA: 7 (ref 5.0–8.0)
PROTEIN UA: NEGATIVE
SPEC GRAV UA: 1.015 (ref 1.010–1.025)
UROBILINOGEN UA: 0.2 U/dL

## 2017-07-16 NOTE — Patient Instructions (Signed)
Sickle Cell Anemia, Adult °Sickle cell anemia is a condition where your red blood cells are shaped like sickles. Red blood cells carry oxygen through the body. Sickle-shaped red blood cells do not live as long as normal red blood cells. They also clump together and block blood from flowing through the blood vessels. These things prevent the body from getting enough oxygen. Sickle cell anemia causes organ damage and pain. It also increases the risk of infection. °Follow these instructions at home: °· Drink enough fluid to keep your pee (urine) clear or pale yellow. Drink more in hot weather and during exercise. °· Do not smoke. Smoking lowers oxygen levels in the blood. °· Only take over-the-counter or prescription medicines as told by your doctor. °· Take antibiotic medicines as told by your doctor. Make sure you finish them even if you start to feel better. °· Take supplements as told by your doctor. °· Consider wearing a medical alert bracelet. This tells anyone caring for you in an emergency of your condition. °· When traveling, keep your medical information, doctors' names, and the medicines you take with you at all times. °· If you have a fever, do not take fever medicines right away. This could cover up a problem. Tell your doctor. °· Keep all follow-up visits with your doctor. Sickle cell anemia requires regular medical care. °Contact a doctor if: °You have a fever. °Get help right away if: °· You feel dizzy or faint. °· You have new belly (abdominal) pain, especially on the left side near the stomach area. °· You have a lasting, often uncomfortable and painful erection of the penis (priapism). If it is not treated right away, you will become unable to have sex (impotence). °· You have numbness in your arms or legs or you have a hard time moving them. °· You have a hard time talking. °· You have a fever or lasting symptoms for more than 2-3 days. °· You have a fever and your symptoms suddenly get  worse. °· You have signs or symptoms of infection. These include: °? Chills. °? Being more tired than normal (lethargy). °? Irritability. °? Poor eating. °? Throwing up (vomiting). °· You have pain that is not helped with medicine. °· You have shortness of breath. °· You have pain in your chest. °· You are coughing up pus-like or bloody mucus. °· You have a stiff neck. °· Your feet or hands swell or have pain. °· Your belly looks bloated. °· Your joints hurt. °This information is not intended to replace advice given to you by your health care provider. Make sure you discuss any questions you have with your health care provider. °Document Released: 11/20/2012 Document Revised: 07/08/2015 Document Reviewed: 09/11/2012 °Elsevier Interactive Patient Education © 2017 [employer] ° °

## 2017-07-16 NOTE — Progress Notes (Signed)
Chief Complaint  Patient presents with  . Sickle Cell Anemia     Subjective:    Patient ID: [name] Meyer., male    DOB: 1980/09/20, 37 y.o.   MRN: [id number]  HPI [name] [name], a 37 year old male history of sickle cell anemia, hemoglobin SS, chronic kidney disease and chronic pain presents for a follow up of sickle cell anemia. Patient says that he has been taking all medications consistently.  Patient typically takes oxycodone 10 mg every 4 hours as needed for moderate-to-severe pain.  Current pain intensity is 3/10 characterized as intermittent and aching.   Patient established care with hematology. He refrains from taking NSAIDs due to stage III chronic kidney disease.  Patient is followed by Dr. Erling Cruz, nephrology. He says that he continues to have a difficult time following renal diet, but has decreased the amount of fast food intake.   He currently denies shortness of breath, headache, blurred vision, paresthesias, chest pain, nausea, vomiting, or diarrhea.  Past Medical History:  Diagnosis Date  . Arachnoid Cyst of brain bilaterally    "2 really small ones in the back of my head; inside; saw them w/MRI" (09/25/2012)  . Bacterial pneumonia ~ 2012   "caught it here in the hospital" (09/25/2012)  . Chronic kidney disease    "from my sickle cell" (09/25/2012)  . CKD (chronic kidney disease), stage II   . GERD (gastroesophageal reflux disease)    "after I eat alot of spicey foods" (09/25/2012)  . Gynecomastia, male 07/10/2012  . History of blood transfusion    "always related to sickle cell crisis" (09/25/2012)  . 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   Noted in [facility]  . Migraines    "take RX qd to prevent them" (09/25/2012)  . Sickle cell anemia (HCC)   . Sickle cell crisis ([place]) 09/25/2012  . Sickle cell nephropathy ([place]) 07/10/2012  . Sinus tachycardia   . Tachycardia with heart rate 121-140 beats per minute with  ambulation 08/04/2016   Social History   Socioeconomic History  . Marital status: Single    Spouse name: Not on file  . Number of children: Not on file  . Years of education: Not on file  . Highest education level: Not on file  Occupational History  . Not on file  Social Needs  . Financial resource strain: Not on file  . Food insecurity:    Worry: Not on file    Inability: Not on file  . Transportation needs:    Medical: Not on file    Non-medical: Not on file  Tobacco Use  . Smoking status: Current Some Day Smoker    Types: Cigarettes  . Smokeless tobacco: Never Used  . Tobacco comment: 09/25/2012 "I don't buy cigarettes; bum one from friends q now and then"  Substance and Sexual Activity  . Alcohol use: No    Alcohol/week: 0.0 oz    Frequency: Never  . Drug use: No    Types: Marijuana    Comment: 09/25/2012 "weed was my biggest problem; stopped that ~ 1 1/2 months ago; I don't do cocaine; can't say I never have though"  . Sexual activity: Yes  Lifestyle  . Physical activity:    Days per week: Not on file    Minutes per session: Not on file  . Stress: Not on file  Relationships  . Social connections:    Talks on phone: Not on file    Gets together: Not  on file    Attends religious service: Not on file    Active member of club or organization: Not on file    Attends meetings of clubs or organizations: Not on file    Relationship status: Not on file  . Intimate partner violence:    Fear of current or ex partner: Not on file    Emotionally abused: Not on file    Physically abused: Not on file    Forced sexual activity: Not on file  Other Topics Concern  . Not on file  Social History Narrative    Lives alone in [place].   Back at school, studying Public relations account executive. Unemployed.    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     Immunization History  Administered Date(s) Administered  . Influenza Split 12/08/2011  . Influenza,inj,Quad PF,6+ Mos 10/11/2012, 12/13/2016  . Pneumococcal Polysaccharide-23 12/13/2016  . Td 09/18/2004  . Tdap 06/13/2017   Review of Systems  HENT: Negative.   Eyes: Negative.  Negative for photophobia, redness and visual disturbance.  Respiratory: Negative.  Negative for cough and shortness of breath.   Cardiovascular: Negative.  Negative for chest pain, palpitations and leg swelling.  Gastrointestinal: Negative.   Endocrine: Negative.  Negative for polydipsia, polyphagia and polyuria.  Genitourinary: Negative.   Musculoskeletal: Positive for arthralgias (lower extremities).  Skin: Negative.   Allergic/Immunologic: Negative.   Neurological: Negative.  Negative for syncope, numbness and headaches.  Hematological: Negative.   Psychiatric/Behavioral: Negative.        Objective:   Physical Exam  Constitutional: He is oriented to person, place, and time. He appears well-developed and well-nourished.  HENT:  Head: Normocephalic and atraumatic.  Right Ear: External ear normal.  Left Ear: External ear normal.  Nose: Nose normal.  Mouth/Throat: Oropharynx is clear and moist.  Eyes: Pupils are equal, round, and reactive to light.  Neck: Normal range of motion. Neck supple.  Cardiovascular: Normal rate, regular rhythm, normal heart sounds and intact distal pulses.  Pulmonary/Chest: Effort normal and breath sounds normal.  Abdominal: Soft. Bowel sounds are normal. There is no tenderness. There is no rebound.  Musculoskeletal:       Lumbar back: He exhibits decreased range of motion, tenderness and pain. He exhibits no spasm.  Neurological: He is alert and oriented to person, place, and time. He has normal reflexes.  Skin: Skin is warm and dry.  Psychiatric: He has a normal mood and affect. His behavior is normal. Judgment and thought content normal.      BP 113/72 (BP Location:  Right Arm, Patient Position: Sitting, Cuff Size: Normal)   Pulse (!) 118 Comment: manually  Temp 99.3 F (37.4 C) (Oral)   Resp 16   Ht 5\' 3"  (1.6 m)   Wt 128 lb (58.1 kg)   SpO2 93%   BMI 22.67 kg/m  Assessment & Plan:  1. Sickle cell nephropathy Patient established care with hematology on 07/18/5407 Will start folic acid 1 mg daily to prevent aplastic bone marrow crises.  Continue hydroxyurea per hematology     Pulmonary evaluation - Patient denies severe recurrent wheezes, shortness of breath with exercise, or persistent cough. If these symptoms develop, pulmonary function tests with spirometry will be ordered, and if abnormal, plan on referral to Pulmonology for further evaluation.  Cardiac - Reviewed previous EKG  Eyes - High risk  of proliferative retinopathy. Annual eye exam with retinal exam recommended to patient. [name] [name] has not had a recent eye exam.    Immunization status - Patient up to date with pneumococcal vaccination. Will also review NCIR.   Acute and chronic painful episodes - Reviewed the [place] reporting system, no inconsistencies noted. Patient is currently on 55 MME/day. We agreed on continuing Oxycodone 10 mg every 6 hours for moderate to severe pain. Patient says that he has around 30 tablets left. [name] [name] and I discussed prescription refill policy, he expressed understanding. We also discussed that he is encouraged to choose pharmacy and receive schedule II medications only from this clinic. [name] [name] had a number of questions and concerns. He is also aware that the prescription history is available to [place] online through the [place]. Controlled substance agreement signed today. We reminded [name] [name] that all patients receiving Schedule II narcotics must be seen for follow within one month of prescription being requested. We reviewed the terms of our pain agreement, including the need to keep medicines in a safe locked location away from children or pets, and the need to report excess sedation or  constipation, measures to avoid constipation, and policies related to early refills and stolen prescriptions. According to the [place] Chronic Pain Initiative program, we have reviewed details related to analgesia, adverse effects, aberrant behaviors. - Basic Metabolic Panel - Urinalysis Dipstick  2. Tachycardia HR 118, manually. Reviewed previous echocardiogram EF 60-65% Pt was previously on diltiazem. He states that medication was discontinued by hematology.  Requesting cardiology consult for further evaluation.  Previous EKG consistent with sinus tachycardia - Ambulatory referral to Cardiology  3. Systolic murmur - Ambulatory referral to Cardiology   RTC: 1 month for medication management    [name]  MSN, FNP-C Patient [place] [address] [place], [place] [address] [phone]

## 2017-07-23 ENCOUNTER — Encounter (HOSPITAL_COMMUNITY): Payer: Self-pay | Admitting: Emergency Medicine

## 2017-07-23 ENCOUNTER — Emergency Department (HOSPITAL_COMMUNITY): Payer: Medicare Other

## 2017-07-23 ENCOUNTER — Inpatient Hospital Stay (HOSPITAL_COMMUNITY)
Admission: EM | Admit: 2017-07-23 | Discharge: 2017-07-26 | DRG: 811 | Disposition: A | Discharge status: Home / Self Care | Payer: Medicare Other | Attending: Internal Medicine | Admitting: Internal Medicine

## 2017-07-23 ENCOUNTER — Other Ambulatory Visit: Payer: Self-pay

## 2017-07-23 DIAGNOSIS — Z886 Allergy status to analgesic agent status: Secondary | ICD-10-CM | POA: Diagnosis not present

## 2017-07-23 DIAGNOSIS — K219 Gastro-esophageal reflux disease without esophagitis: Secondary | ICD-10-CM | POA: Diagnosis present

## 2017-07-23 DIAGNOSIS — J189 Pneumonia, unspecified organism: Secondary | ICD-10-CM | POA: Diagnosis present

## 2017-07-23 DIAGNOSIS — Z79891 Long term (current) use of opiate analgesic: Secondary | ICD-10-CM | POA: Diagnosis not present

## 2017-07-23 DIAGNOSIS — D5700 Hb-SS disease with crisis, unspecified: Principal | ICD-10-CM

## 2017-07-23 DIAGNOSIS — K210 Gastro-esophageal reflux disease with esophagitis: Secondary | ICD-10-CM | POA: Diagnosis not present

## 2017-07-23 DIAGNOSIS — N183 Chronic kidney disease, stage 3 (moderate): Secondary | ICD-10-CM | POA: Diagnosis present

## 2017-07-23 DIAGNOSIS — D571 Sickle-cell disease without crisis: Secondary | ICD-10-CM | POA: Diagnosis present

## 2017-07-23 DIAGNOSIS — E559 Vitamin D deficiency, unspecified: Secondary | ICD-10-CM | POA: Diagnosis present

## 2017-07-23 DIAGNOSIS — Z8701 Personal history of pneumonia (recurrent): Secondary | ICD-10-CM | POA: Diagnosis not present

## 2017-07-23 DIAGNOSIS — Z9049 Acquired absence of other specified parts of digestive tract: Secondary | ICD-10-CM | POA: Diagnosis not present

## 2017-07-23 DIAGNOSIS — R000 Tachycardia, unspecified: Secondary | ICD-10-CM | POA: Diagnosis present

## 2017-07-23 DIAGNOSIS — N08 Glomerular disorders in diseases classified elsewhere: Secondary | ICD-10-CM | POA: Diagnosis present

## 2017-07-23 DIAGNOSIS — R918 Other nonspecific abnormal finding of lung field: Secondary | ICD-10-CM | POA: Diagnosis not present

## 2017-07-23 DIAGNOSIS — N184 Chronic kidney disease, stage 4 (severe): Secondary | ICD-10-CM | POA: Diagnosis present

## 2017-07-23 DIAGNOSIS — Z885 Allergy status to narcotic agent status: Secondary | ICD-10-CM | POA: Diagnosis not present

## 2017-07-23 DIAGNOSIS — F17210 Nicotine dependence, cigarettes, uncomplicated: Secondary | ICD-10-CM | POA: Diagnosis present

## 2017-07-23 DIAGNOSIS — Z803 Family history of malignant neoplasm of breast: Secondary | ICD-10-CM | POA: Diagnosis not present

## 2017-07-23 DIAGNOSIS — Z79899 Other long term (current) drug therapy: Secondary | ICD-10-CM | POA: Diagnosis not present

## 2017-07-23 DIAGNOSIS — N189 Chronic kidney disease, unspecified: Secondary | ICD-10-CM

## 2017-07-23 LAB — COMPREHENSIVE METABOLIC PANEL
ALBUMIN: 1.7 g/dL — AB (ref 3.5–5.0)
ALK PHOS: 318 U/L — AB (ref 38–126)
ALT: 37 U/L (ref 17–63)
AST: 64 U/L — AB (ref 15–41)
Anion gap: 7 (ref 5–15)
BILIRUBIN TOTAL: 1.8 mg/dL — AB (ref 0.3–1.2)
BUN: 22 mg/dL — AB (ref 6–20)
CALCIUM: 7.4 mg/dL — AB (ref 8.9–10.3)
CO2: 19 mmol/L — ABNORMAL LOW (ref 22–32)
Chloride: 111 mmol/L (ref 101–111)
Creatinine, Ser: 2.24 mg/dL — ABNORMAL HIGH (ref 0.61–1.24)
GFR calc Af Amer: 42 mL/min — ABNORMAL LOW (ref >60)
GFR calc non Af Amer: 36 mL/min — ABNORMAL LOW (ref >60)
GLUCOSE: 107 mg/dL — AB (ref 65–99)
Potassium: 4.3 mmol/L (ref 3.5–5.1)
Sodium: 137 mmol/L (ref 135–145)
TOTAL PROTEIN: 5.1 g/dL — AB (ref 6.5–8.1)

## 2017-07-23 LAB — RETICULOCYTES
RBC.: 1.64 MIL/uL — ABNORMAL LOW (ref 4.22–5.81)
RETIC CT PCT: 13.5 % — AB (ref 0.4–3.1)
Retic Count, Absolute: 221.4 10*3/uL — ABNORMAL HIGH (ref 19.0–186.0)

## 2017-07-23 LAB — CBC WITH DIFFERENTIAL/PLATELET
Basophils Absolute: 0 10*3/uL (ref 0.0–0.1)
Basophils Relative: 0 %
EOS ABS: 0.1 10*3/uL (ref 0.0–0.7)
Eosinophils Relative: 1 %
HCT: 17.7 % — ABNORMAL LOW (ref 39.0–52.0)
Hemoglobin: 6.2 g/dL — CL (ref 13.0–17.0)
Lymphocytes Relative: 49 %
Lymphs Abs: 4.4 10*3/uL — ABNORMAL HIGH (ref 0.7–4.0)
MCH: 37.8 pg — AB (ref 26.0–34.0)
MCHC: 35 g/dL (ref 30.0–36.0)
MCV: 107.9 fL — ABNORMAL HIGH (ref 78.0–100.0)
MONO ABS: 0.3 10*3/uL (ref 0.1–1.0)
Monocytes Relative: 3 %
NEUTROS ABS: 4.3 10*3/uL (ref 1.7–7.7)
NRBC: 2 /100{WBCs} — AB
Neutrophils Relative %: 47 %
PLATELETS: 154 10*3/uL (ref 150–400)
RBC: 1.64 MIL/uL — ABNORMAL LOW (ref 4.22–5.81)
WBC: 9.1 10*3/uL (ref 4.0–10.5)

## 2017-07-23 LAB — PREPARE RBC (CROSSMATCH)

## 2017-07-23 MED ORDER — POLYETHYLENE GLYCOL 3350 17 G PO PACK: 17.0000 g | PACK | Freq: Every day | ORAL | Status: DC | PRN

## 2017-07-23 MED ORDER — ENOXAPARIN SODIUM 30 MG/0.3ML ~~LOC~~ SOLN
30.0000 mg | Freq: Every day | SUBCUTANEOUS | Status: DC
  Administered 2017-07-23 – 2017-07-25 (×3): 30 mg via SUBCUTANEOUS
  Filled 2017-07-23 (×3): qty 0.3

## 2017-07-23 MED ORDER — HYDROMORPHONE HCL 1 MG/ML IJ SOLN: 1.0000 mg | INTRAMUSCULAR | Status: AC

## 2017-07-23 MED ORDER — HYDROXYUREA 500 MG PO CAPS
1500.0000 mg | ORAL_CAPSULE | Freq: Every day | ORAL | Status: DC
  Administered 2017-07-24 – 2017-07-25 (×3): 1500 mg via ORAL
  Filled 2017-07-23 (×3): qty 3

## 2017-07-23 MED ORDER — VITAMIN D 1000 UNITS PO TABS
2000.0000 [IU] | ORAL_TABLET | Freq: Two times a day (BID) | ORAL | Status: DC
  Administered 2017-07-23 – 2017-07-26 (×6): 2000 [IU] via ORAL
  Filled 2017-07-23 (×6): qty 2

## 2017-07-23 MED ORDER — SODIUM CHLORIDE 0.9 % IV SOLN: 10.0000 mL/h | Freq: Once | INTRAVENOUS | Status: DC

## 2017-07-23 MED ORDER — PIPERACILLIN-TAZOBACTAM 3.375 G IVPB
3.3750 g | Freq: Three times a day (TID) | INTRAVENOUS | Status: DC
  Administered 2017-07-24 – 2017-07-26 (×7): 3.375 g via INTRAVENOUS
  Filled 2017-07-23 (×7): qty 50

## 2017-07-23 MED ORDER — SODIUM CHLORIDE 0.9 % IV SOLN
25.0000 mg | INTRAVENOUS | Status: DC | PRN
  Filled 2017-07-23: qty 0.5

## 2017-07-23 MED ORDER — FOLIC ACID 1 MG PO TABS
1.0000 mg | ORAL_TABLET | Freq: Every day | ORAL | Status: DC
  Administered 2017-07-24 – 2017-07-26 (×3): 1 mg via ORAL
  Filled 2017-07-23 (×3): qty 1

## 2017-07-23 MED ORDER — DIPHENHYDRAMINE HCL 25 MG PO CAPS
25.0000 mg | ORAL_CAPSULE | ORAL | Status: DC | PRN
  Administered 2017-07-23 – 2017-07-24 (×2): 25 mg via ORAL
  Filled 2017-07-23 (×2): qty 1

## 2017-07-23 MED ORDER — HYDROMORPHONE 1 MG/ML IV SOLN
INTRAVENOUS | Status: DC
  Administered 2017-07-23: via INTRAVENOUS
  Administered 2017-07-24: 2.5 mg via INTRAVENOUS
  Administered 2017-07-24: 0.5 mg via INTRAVENOUS
  Administered 2017-07-24: 2.5 mg via INTRAVENOUS
  Filled 2017-07-23: qty 25

## 2017-07-23 MED ORDER — SENNOSIDES-DOCUSATE SODIUM 8.6-50 MG PO TABS
1.0000 | ORAL_TABLET | Freq: Two times a day (BID) | ORAL | Status: DC
  Administered 2017-07-23 – 2017-07-25 (×2): 1 via ORAL
  Filled 2017-07-23 (×5): qty 1

## 2017-07-23 MED ORDER — HYDROMORPHONE HCL 1 MG/ML IJ SOLN
1.0000 mg | INTRAMUSCULAR | Status: DC | PRN
  Administered 2017-07-23: 1 mg via INTRAVENOUS
  Filled 2017-07-23: qty 1

## 2017-07-23 MED ORDER — DIPHENHYDRAMINE HCL 25 MG PO CAPS: 25.0000 mg | ORAL_CAPSULE | ORAL | Status: DC | PRN

## 2017-07-23 MED ORDER — HYDROMORPHONE HCL 1 MG/ML IJ SOLN
0.5000 mg | INTRAMUSCULAR | Status: DC | PRN
  Administered 2017-07-24: 0.5 mg via INTRAVENOUS
  Filled 2017-07-23: qty 0.5

## 2017-07-23 MED ORDER — NALOXONE HCL 0.4 MG/ML IJ SOLN: 0.4000 mg | INTRAMUSCULAR | Status: DC | PRN

## 2017-07-23 MED ORDER — HYDROMORPHONE HCL 1 MG/ML IJ SOLN
0.5000 mg | INTRAMUSCULAR | Status: AC
  Administered 2017-07-23: 0.5 mg via INTRAVENOUS
  Filled 2017-07-23: qty 1

## 2017-07-23 MED ORDER — SODIUM CHLORIDE 0.9% FLUSH: 9.0000 mL | INTRAVENOUS | Status: DC | PRN

## 2017-07-23 MED ORDER — HYDROMORPHONE HCL 1 MG/ML IJ SOLN
1.0000 mg | INTRAMUSCULAR | Status: AC
  Administered 2017-07-23: 1 mg via INTRAVENOUS
  Filled 2017-07-23: qty 1

## 2017-07-23 MED ORDER — PIPERACILLIN-TAZOBACTAM 3.375 G IVPB 30 MIN
3.3750 g | Freq: Once | INTRAVENOUS | Status: AC
  Administered 2017-07-23: 3.375 g via INTRAVENOUS
  Filled 2017-07-23: qty 50

## 2017-07-23 MED ORDER — VANCOMYCIN HCL IN DEXTROSE 1-5 GM/200ML-% IV SOLN
1000.0000 mg | Freq: Once | INTRAVENOUS | Status: AC
  Administered 2017-07-23: 1000 mg via INTRAVENOUS
  Filled 2017-07-23: qty 200

## 2017-07-23 MED ORDER — VANCOMYCIN HCL 10 G IV SOLR
1250.0000 mg | INTRAVENOUS | Status: DC
  Administered 2017-07-24: 1250 mg via INTRAVENOUS
  Filled 2017-07-23: qty 1250

## 2017-07-23 MED ORDER — HYDROMORPHONE HCL 1 MG/ML IJ SOLN: 0.5000 mg | INTRAMUSCULAR | Status: AC

## 2017-07-23 MED ORDER — IPRATROPIUM-ALBUTEROL 0.5-2.5 (3) MG/3ML IN SOLN
3.0000 mL | Freq: Four times a day (QID) | RESPIRATORY_TRACT | Status: DC
  Filled 2017-07-23: qty 3

## 2017-07-23 MED ORDER — OXYCODONE HCL ER 15 MG PO T12A
15.0000 mg | EXTENDED_RELEASE_TABLET | Freq: Two times a day (BID) | ORAL | Status: DC
  Administered 2017-07-23 – 2017-07-26 (×6): 15 mg via ORAL
  Filled 2017-07-23 (×6): qty 1

## 2017-07-23 MED ORDER — SODIUM CHLORIDE 0.45 % IV SOLN
INTRAVENOUS | Status: DC
Start: 2017-07-23 — End: 2017-07-26
  Administered 2017-07-23 – 2017-07-26 (×5): via INTRAVENOUS

## 2017-07-23 NOTE — ED Notes (Signed)
ED TO INPATIENT HANDOFF REPORT  Name/Age/Gender [name] [name]. 37 y.o. male  Code Status Code Status History    Date Active Date Inactive Code Status Order ID Comments User Context   05/12/2017 1514 05/18/2017 1850 Full Code [id number]  [name], MD Inpatient   04/20/2017 0205 05/03/2017 1855 Full Code [id number]  [name], MD ED   04/07/2017 1712 04/09/2017 1238 Full Code [id number]  Marene Lenz, MD ED   02/27/2017 2140 03/04/2017 1508 Full Code [id number]  Jani Gravel, MD ED   12/13/2016 1812 12/21/2016 1659 Full Code [id number]  Leana Gamer, MD ED   10/14/2016 1944 10/18/2016 1637 Full Code [id number]  Jani Gravel, MD ED   08/02/2016 0124 08/04/2016 1731 Full Code [id number]  Norval Morton, MD ED   02/26/2016 2148 02/27/2016 1750 Full Code [id number]  Etta Quill, DO ED   03/11/2015 1636 03/15/2015 1544 Full Code [id number]  Janora Norlander, DO ED   11/21/2014 0308 11/24/2014 2020 Full Code [id number]  Melancon, York Ram, MD Inpatient   08/18/2014 0148 08/20/2014 1820 Full Code [id number]  Archie Patten, MD Inpatient   03/22/2014 2315 03/24/2014 1826 Full Code [id number]  Toy Baker, MD ED   03/19/2014 2048 03/20/2014 2250 Full Code [id number]  Lorna Few, DO Inpatient   12/26/2013 0344 12/29/2013 2103 Full Code [id number]  Timmothy Euler, MD ED   07/28/2013 1833 07/31/2013 1729 Full Code [id number]  Coral Spikes, DO Inpatient   05/09/2013 1926 05/10/2013 1644 Full Code [id number]  Hilton Sinclair, MD ED   01/24/2013 0143 01/26/2013 1931 Full Code [id number]  Leone [place], MD Inpatient   09/25/2012 0846 09/26/2012 2020 Full Code [id number]  Ma Hillock, DO ED      Home/SNF/Other Home  Chief Complaint sickle cell pain / chest pain   Level of Care/Admitting Diagnosis ED Disposition    ED Disposition Condition [facility] Area: [place] [[id number]]  Level of Care: Telemetry [5]  Admit to tele based on  following criteria: Other see comments  Comments: Severe anemia  Diagnosis: Sickle cell crisis [facility]) [[id number]]  Admitting Physician: Harvie Bridge [[id number]]  Attending Physician: Harvie Bridge [[id number]]  Estimated length of stay: past midnight tomorrow  Certification:: I certify this patient will need inpatient services for at least 2 midnights  PT Class (Do Not Modify): Inpatient [101]  PT Acc Code (Do Not Modify): Private [1]       Medical History Past Medical History:  Diagnosis Date  . Arachnoid Cyst of brain bilaterally    "2 really small ones in the back of my head; inside; saw them w/MRI" (09/25/2012)  . Bacterial pneumonia ~ 2012   "caught it here in the hospital" (09/25/2012)  . Chronic kidney disease    "from my sickle cell" (09/25/2012)  . CKD (chronic kidney disease), stage II   . GERD (gastroesophageal reflux disease)    "after I eat alot of spicey foods" (09/25/2012)  . Gynecomastia, male 07/10/2012  . History of blood transfusion    "always related to sickle cell crisis" (09/25/2012)  . 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   Noted in noted from [facility] at [facility]  . Migraines    "take RX qd to prevent them" (09/25/2012)  . Sickle cell anemia (HCC)   . Sickle cell crisis ([place]) 09/25/2012  . Sickle cell nephropathy ([place]) 07/10/2012  . Sinus tachycardia   . Tachycardia with heart rate  121-140 beats per minute with ambulation 08/04/2016    Allergies Allergies  Allergen Reactions  . Nsaids   . Morphine And Related Other (See Comments)    "real bad headaches"    IV Location/Drains/Wounds Patient Lines/Drains/Airways Status   Active Line/Drains/Airways    Name:   Placement date:   Placement time:   Site:   Days:   Peripheral IV 07/23/17 Right Forearm   07/23/17    1855    Forearm   less than 1   Incision (Closed) 04/25/17 Flank Left   04/25/17    1950     89          Labs/Imaging Results for orders placed or performed during  the hospital encounter of 07/23/17 (from the past 48 hour(s))  Comprehensive metabolic panel     Status: Abnormal   Collection Time: 07/23/17  5:25 PM  Result Value Ref Range   Sodium 137 135 - 145 mmol/L   Potassium 4.3 3.5 - 5.1 mmol/L   Chloride 111 101 - 111 mmol/L   CO2 19 (L) 22 - 32 mmol/L   Glucose, Bld 107 (H) 65 - 99 mg/dL   BUN 22 (H) 6 - 20 mg/dL   Creatinine, Ser 2.24 (H) 0.61 - 1.24 mg/dL   Calcium 7.4 (L) 8.9 - 10.3 mg/dL   Total Protein 5.1 (L) 6.5 - 8.1 g/dL   Albumin 1.7 (L) 3.5 - 5.0 g/dL   AST 64 (H) 15 - 41 U/L   ALT 37 17 - 63 U/L   Alkaline Phosphatase 318 (H) 38 - 126 U/L   Total Bilirubin 1.8 (H) 0.3 - 1.2 mg/dL   GFR calc non Af Amer 36 (L) >60 mL/min   GFR calc Af Amer 42 (L) >60 mL/min    Comment: (NOTE) The eGFR has been calculated using the CKD EPI equation. This calculation has not been validated in all clinical situations. eGFR's persistently <60 mL/min signify possible Chronic Kidney Disease.    Anion gap 7 5 - 15    Comment: Performed at [facility], [place] [address] [place], [place] [address]  CBC with Differential     Status: Abnormal   Collection Time: 07/23/17  5:25 PM  Result Value Ref Range   WBC 9.1 4.0 - 10.5 K/uL   RBC 1.64 (L) 4.22 - 5.81 MIL/uL   Hemoglobin 6.2 (LL) 13.0 - 17.0 g/dL    Comment: REPEATED TO VERIFY CRITICAL RESULT CALLED TO, READ BACK BY AND VERIFIED WITH: Forde Radon [id number] @ 1747 BY J SCOTTON    HCT 17.7 (L) 39.0 - 52.0 %   MCV 107.9 (H) 78.0 - 100.0 fL   MCH 37.8 (H) 26.0 - 34.0 pg   MCHC 35.0 30.0 - 36.0 g/dL   RDW NOT CALCULATED 11.5 - 15.5 %   Platelets 154 150 - 400 K/uL   Neutrophils Relative % 47 %   Lymphocytes Relative 49 %   Monocytes Relative 3 %   Eosinophils Relative 1 %   Basophils Relative 0 %   nRBC 2 (H) 0 /100 WBC   Neutro Abs 4.3 1.7 - 7.7 K/uL   Lymphs Abs 4.4 (H) 0.7 - 4.0 K/uL   Monocytes Absolute 0.3 0.1 - 1.0 K/uL   Eosinophils Absolute 0.1 0.0 - 0.7 K/uL    Basophils Absolute 0.0 0.0 - 0.1 K/uL   RBC Morphology RARE NRBCs     Comment: POLYCHROMASIA PRESENT TARGET CELLS HOWELL/JOLLY BODIES Sickle cells present Performed at [facility]  [place] [address], [place], [place] [address]   Reticulocytes     Status: Abnormal   Collection Time: 07/23/17  5:25 PM  Result Value Ref Range   Retic Ct Pct 13.5 (H) 0.4 - 3.1 %   RBC. 1.64 (L) 4.22 - 5.81 MIL/uL   Retic Count, Absolute 221.4 (H) 19.0 - 186.0 K/uL    Comment: Performed at [facility], [place] [address] [place], [place] [address]  Type and screen [place]     Status: None (Preliminary result)   Collection Time: 07/23/17  8:18 PM  Result Value Ref Range   ABO/RH(D) O POS    Antibody Screen NEG    Sample Expiration      07/26/2017 Performed at [facility], [place] [address], [place], [place] [address]    Unit Number [id number]    Blood Component Type RED CELLS,LR    Unit division 00    Status of Unit ALLOCATED    Transfusion Status OK TO TRANSFUSE    Crossmatch Result Compatible   Prepare RBC     Status: None   Collection Time: 07/23/17  8:18 PM  Result Value Ref Range   Order Confirmation      ORDER PROCESSED BY BLOOD BANK Performed at [facility], [place] [address], [place], [place] [address]    Dg Chest 2 View  Result Date: 07/23/2017 CLINICAL DATA:  Sickle cell crisis.  Pain. EXAM: CHEST - 2 VIEW COMPARISON:  May 15, 2017 FINDINGS: Mild increased opacity in the left base. The heart, hila, and mediastinum are stable. No other acute abnormalities. IMPRESSION: Mild increased opacity in the left base favored to represent atelectasis. Changes in the bones consistent with sickle cell. Electronically Signed   By: Dorise Bullion III M.D   On: 07/23/2017 19:45    Pending Labs Unresulted Labs (From admission, onward)   Start     Ordered   Signed and Held  CBC  (enoxaparin (LOVENOX)    CrCl  < 30 ml/min)  Once,   R    Comments:  Baseline for enoxaparin therapy IF NOT ALREADY DRAWN.  Notify MD if PLT < 100 K.    Signed and Held   Signed and Held  Creatinine, serum  (enoxaparin (LOVENOX)    CrCl < 30 ml/min)  Once,   R    Comments:  Baseline for enoxaparin therapy IF NOT ALREADY DRAWN.    Signed and Held   Signed and Held  Creatinine, serum  (enoxaparin (LOVENOX)    CrCl < 30 ml/min)  Weekly,   R    Comments:  while on enoxaparin therapy.    Signed and Held   Signed and Held  Urinalysis, Routine w reflex microscopic  Once,   R     Signed and Held   Signed and Held  Sputum culture  Once,   R    Question:  Patient immune status  Answer:  Normal   Signed and Held      Vitals/Pain Today's Vitals   07/23/17 2010 07/23/17 2035 07/23/17 2105 07/23/17 2138  BP:  114/73 107/72 108/81  Pulse:  (!) 102 (!) 106 94  Resp:  16 15 (!) 28  Temp:      TempSrc:      SpO2:  97% 100% 99%  Weight:      PainSc: 9        Isolation Precautions No active isolations  Medications Medications  0.45 % sodium chloride infusion ( Intravenous New Bag/Given 07/23/17 1851)  HYDROmorphone (DILAUDID) injection 1 mg (1 mg Intravenous Given 07/23/17 2148)    Or  HYDROmorphone (DILAUDID) injection 1 mg ( Subcutaneous See Alternative 07/23/17 2148)  0.9 %  sodium chloride infusion (has no administration in time range)  vancomycin (VANCOCIN) IVPB 1000 mg/200 mL premix (has no administration in time range)  piperacillin-tazobactam (ZOSYN) IVPB 3.375 g (3.375 g Intravenous New Bag/Given 07/23/17 2149)  HYDROmorphone (DILAUDID) injection 0.5 mg (0.5 mg Intravenous Given 07/23/17 1916)    Or  HYDROmorphone (DILAUDID) injection 0.5 mg ( Subcutaneous See Alternative 07/23/17 1916)  HYDROmorphone (DILAUDID) injection 1 mg (1 mg Intravenous Given 07/23/17 1852)    Or  HYDROmorphone (DILAUDID) injection 1 mg ( Subcutaneous See Alternative 07/23/17 1852)  HYDROmorphone (DILAUDID) injection 1 mg (1 mg Intravenous  Given 07/23/17 2012)    Or  HYDROmorphone (DILAUDID) injection 1 mg ( Subcutaneous See Alternative 07/23/17 2012)    Mobility walks

## 2017-07-23 NOTE — Progress Notes (Signed)
A consult was received from an ED physician for vancomycin and Zosyn per pharmacy dosing (for an indication other than meningitis). The patient's profile has been reviewed for ht/wt/allergies/indication/available labs. A one time order has been placed for the above antibiotics.  Further antibiotics/pharmacy consults should be ordered by admitting physician if indicated.                       Reuel Boom, PharmD, BCPS [phone] 07/23/2017, 8:13 PM

## 2017-07-23 NOTE — ED Notes (Signed)
Provider at bedside

## 2017-07-23 NOTE — H&P (Signed)
History and Physical   [facility] [place] [facility] [place] [facility] Admission History and Physical [employer], D.O.    Patient Name: [name] [name] MR#: [id number] Date of Birth: 04/08/1980 Date of Admission: 07/23/2017  Referring MD/NP/PA: Dr. Corlis Leak Primary Care Physician: Massie Maroon, FNP  Chief Complaint:  Chief Complaint  Patient presents with  . Sickle Cell Pain Crisis    HPI: [name] [name] is a 37 y.o. male with a known history of sickle cell anemia, CKD with sickle cell nephropathy, GERD presents to the emergency department for evaluation of chest and upper back pain associated with chills and mild cough for the past 2 days.  Pain is refractory to home narcotics.  Patient denies fevers/chills, weakness, dizziness, shortness of breath, N/V/C/D, abdominal pain, dysuria/frequency, changes in mental status.    Otherwise there has been no change in status. Patient has been taking medication as prescribed and there has been no recent change in medication or diet.  No recent antibiotics.  There has been no recent illness, hospitalizations, travel or sick contacts.    EMS/ED Course: Patient received Dilaudid, Zosyn, vancomycin. Medical admission has been requested for further management of sickle cell crisis, community-acquired pneumonia.  Review of Systems:  CONSTITUTIONAL: Positive chills.  No fever, fatigue, weakness, weight gain/loss, headache. EYES: No blurry or double vision. ENT: No tinnitus, postnasal drip, redness or soreness of the oropharynx. RESPIRATORY: No cough, dyspnea, wheeze.  No hemoptysis.  CARDIOVASCULAR: Positive chest and upper back pain, negative palpitations, syncope, orthopnea. No lower extremity edema.  GASTROINTESTINAL: No nausea, vomiting, abdominal pain, diarrhea, constipation.  No hematemesis, melena or hematochezia. GENITOURINARY: No dysuria, frequency, hematuria. ENDOCRINE: No polyuria or nocturia. No heat or cold  intolerance. HEMATOLOGY: No anemia, bruising, bleeding. INTEGUMENTARY: No rashes, ulcers, lesions. MUSCULOSKELETAL: No arthritis, gout,NEUROLOGIC: No numbness, tingling, ataxia, seizure-type activity, weakness. PSYCHIATRIC: No anxiety, depression, insomnia.   Past Medical History:  Diagnosis Date  . Arachnoid Cyst of brain bilaterally    "2 really small ones in the back of my head; inside; saw them w/MRI" (09/25/2012)  . Bacterial pneumonia ~ 2012   "caught it here in the hospital" (09/25/2012)  . Chronic kidney disease    "from my sickle cell" (09/25/2012)  . CKD (chronic kidney disease), stage II   . GERD (gastroesophageal reflux disease)    "after I eat alot of spicey foods" (09/25/2012)  . Gynecomastia, male 07/10/2012  . History of blood transfusion    "always related to sickle cell crisis" (09/25/2012)  . 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   Noted in noted from [facility] at [facility]  . Migraines    "take RX qd to prevent them" (09/25/2012)  . Sickle cell anemia (HCC)   . Sickle cell crisis (HCC) 09/25/2012  . Sickle cell nephropathy (HCC) 07/10/2012  . Sinus tachycardia   . Tachycardia with heart rate 121-140 beats per minute with ambulation 08/04/2016    Past Surgical History:  Procedure Laterality Date  . CHOLECYSTECTOMY  ~ 2012  . COLONOSCOPY N/A 04/23/2017   Procedure: COLONOSCOPY;  Surgeon: Hilarie Fredrickson, MD;  Location: [facility];  Service: Endoscopy;  Laterality: N/A;  . IR FLUORO GUIDE CV LINE RIGHT  12/17/2016  . IR REMOVAL TUN CV CATH W/O FL  12/21/2016  . IR US GUIDE VASC ACCESS RIGHT  12/17/2016  . spleenectomy       reports that he has been smoking cigarettes.  He has never used smokeless tobacco. He reports that he does not drink  alcohol or use drugs.  Allergies  Allergen Reactions  . Nsaids   . Morphine And Related Other (See Comments)    "real bad headaches"    Family History  Problem Relation Age of Onset  . Breast cancer  Mother     Prior to Admission medications   Medication Sig Start Date End Date Taking? Authorizing Provider  Cholecalciferol (VITAMIN D) 2000 units tablet Take 2,000 Units by mouth 2 (two) times daily. 06/28/17  Yes [provider]  folic acid (FOLVITE) 1 MG tablet Take 1 tablet (1 mg total) daily by mouth. 12/22/16  Yes Altha Harm, MD  hydroxyurea (HYDREA) 500 MG capsule Take 1,500 mg by mouth at bedtime. May take with food to minimize GI side effects.    Yes [provider]  nortriptyline (PAMELOR) 25 MG capsule TAKE 1 CAPSULE (25 MG TOTAL) BY MOUTH AT BEDTIME. 01/18/17  Yes Reva Bores, MD  oxyCODONE (OXYCONTIN) 15 mg 12 hr tablet Take 1 tablet (15 mg total) by mouth every 12 (twelve) hours. 07/11/17  Yes Massie Maroon, FNP  Oxycodone HCl 10 MG TABS Take 1 tablet (10 mg total) by mouth every 6 (six) hours as needed. 07/11/17  Yes Massie Maroon, FNP  diltiazem (CARDIZEM) 30 MG tablet TAKE 1 TABLET (30 MG TOTAL) BY MOUTH EVERY 12 (TWELVE) HOURS. Patient not taking: Reported on 07/16/2017 06/13/17   Massie Maroon, FNP  ergocalciferol (DRISDOL) 50000 units capsule Take 1 capsule (50,000 Units total) by mouth once a week. Patient not taking: Reported on 07/23/2017 05/13/17   Massie Maroon, FNP  sodium bicarbonate 650 MG tablet Take 1 tablet (650 mg total) 3 (three) times daily by mouth. Patient not taking: Reported on 06/13/2017 12/21/16   Altha Harm, MD    Physical Exam: Vitals:   07/23/17 1854 07/23/17 1919 07/23/17 2035 07/23/17 2105  BP:  112/75 114/73 107/72  Pulse:  96 (!) 102 (!) 106  Resp:  16 16 15   Temp:      TempSrc:      SpO2:  95% 97% 100%  Weight: 59 kg (130 lb)       GENERAL: 37 y.o.-year-old male patient, well-developed, well-nourished lying in the bed in no acute distress.  Pleasant and cooperative.   HEENT: Head atraumatic, normocephalic. Pupils equal. Mucus membranes moist. NECK: Supple. No JVD. CHEST: Normal breath sounds  bilaterally. No wheezing, rales, rhonchi or crackles. No use of accessory muscles of respiration.  No reproducible chest wall tenderness.  CARDIOVASCULAR: S1, S2 normal. No murmurs, rubs, or gallops. Cap refill <2 seconds. Pulses intact distally.  ABDOMEN: Soft, nondistended, nontender. No rebound, guarding, rigidity. Normoactive bowel sounds present in all four quadrants.  EXTREMITIES: No pedal edema, cyanosis, or clubbing. No calf tenderness or Homan's sign.  NEUROLOGIC: The patient is alert and oriented x 3. Cranial nerves II through XII are grossly intact with no focal sensorimotor deficit. PSYCHIATRIC:  Normal affect, mood, thought content. SKIN: Warm, dry, and intact without obvious rash, lesion, or ulcer.    Labs on Admission:  CBC: Recent Labs  Lab 07/23/17 1725  WBC 9.1  NEUTROABS 4.3  HGB 6.2*  HCT 17.7*  MCV 107.9*  PLT 154   Basic Metabolic Panel: Recent Labs  Lab 07/23/17 1725  NA 137  K 4.3  CL 111  CO2 19*  GLUCOSE 107*  BUN 22*  CREATININE 2.24*  CALCIUM 7.4*   GFR: Estimated Creatinine Clearance: 37.4 mL/min (A) (by C-G formula based  on SCr of 2.24 mg/dL (H)). Liver Function Tests: Recent Labs  Lab 07/23/17 1725  AST 64*  ALT 37  ALKPHOS 318*  BILITOT 1.8*  PROT 5.1*  ALBUMIN 1.7*   No results for input(s): LIPASE, AMYLASE in the last 168 hours. No results for input(s): AMMONIA in the last 168 hours. Coagulation Profile: No results for input(s): INR, PROTIME in the last 168 hours. Cardiac Enzymes: No results for input(s): CKTOTAL, CKMB, CKMBINDEX, TROPONINI in the last 168 hours. BNP (last 3 results) No results for input(s): PROBNP in the last 8760 hours. HbA1C: No results for input(s): HGBA1C in the last 72 hours. CBG: No results for input(s): GLUCAP in the last 168 hours. Lipid Profile: No results for input(s): CHOL, HDL, LDLCALC, TRIG, CHOLHDL, LDLDIRECT in the last 72 hours. Thyroid Function Tests: No results for input(s): TSH,  T4TOTAL, FREET4, T3FREE, THYROIDAB in the last 72 hours. Anemia Panel: Recent Labs    07/23/17 1725  RETICCTPCT 13.5*   Urine analysis:    Component Value Date/Time   COLORURINE YELLOW 05/12/2017 1046   APPEARANCEUR CLEAR 05/12/2017 1046   LABSPEC 1.005 05/12/2017 1046   PHURINE 9.0 (H) 05/12/2017 1046   GLUCOSEU >=500 (A) 05/12/2017 1046   HGBUR SMALL (A) 05/12/2017 1046   HGBUR small 03/10/2010 1445   BILIRUBINUR neg 07/16/2017 1016   KETONESUR NEGATIVE 05/12/2017 1046   PROTEINUR Negative 07/16/2017 1016   PROTEINUR >=300 (A) 05/12/2017 1046   UROBILINOGEN 0.2 07/16/2017 1016   UROBILINOGEN 0.2 05/11/2017 1335   NITRITE neg 07/16/2017 1016   NITRITE NEGATIVE 05/12/2017 1046   LEUKOCYTESUR Trace (A) 07/16/2017 1016   Sepsis Labs: @LABRCNTIP (procalcitonin:4,lacticidven:4) )No results found for this or any previous visit (from the past 240 hour(s)).   Radiological Exams on Admission: Dg Chest 2 View  Result Date: 07/23/2017 CLINICAL DATA:  Sickle cell crisis.  Pain. EXAM: CHEST - 2 VIEW COMPARISON:  May 15, 2017 FINDINGS: Mild increased opacity in the left base. The heart, hila, and mediastinum are stable. No other acute abnormalities. IMPRESSION: Mild increased opacity in the left base favored to represent atelectasis. Changes in the bones consistent with sickle cell. Electronically Signed   By: Gerome Sam III M.D   On: 07/23/2017 19:45    EKG: Sinus tachycardia at 129 bpm with normal axis and nonspecific ST-T wave changes.   Assessment/Plan  This is a 37 y.o. male with a history of sickle cell anemia, CKD with sickle cell nephropathy, GERD now being admitted with:  #. Sickle Cell crisis with anemia -Admit inpatient -Pain control -Continue transfusion initiated in the emergency department -Continue home medications -Please contact sickle cell team in a.m.  #.  Community-acquired pneumonia -Continue Zosyn and Vanco initially did in the emergency  department O2 nebulizers as needed  Admission status: Inpatient IV Fluids: Normal saline Diet/Nutrition: Heart healthy Consults called: Sickle cell team DVT Px: Lovenox, SCDs and early ambulation. Code Status: Full Code  Disposition Plan: To home in 1-2 days  All the records are reviewed and case discussed with ED provider. Management plans discussed with the patient and/or family who express understanding and agree with plan of care.  Michaela Broski D.O. on 07/23/2017 at 9:15 PM CC: Primary care physician; Massie Maroon, FNP   07/23/2017, 9:15 PM

## 2017-07-23 NOTE — ED Provider Notes (Signed)
[facility] DEPT Provider Note   CSN: [id number] Arrival date & time: 07/23/17  1653     History   Chief Complaint Chief Complaint  Patient presents with  . Sickle Cell Pain Crisis    HPI [name] [name]. is a 37 y.o. male.  HPI   Patient is a 37 year old male with sickle cell disease presenting today with sickle cell appearing.  He reports is his normal pain crisis.  Patient reports chest pain and back pain.  Denies any cough, congestion, fever.  He reports he took his normal oxycodone 15 mg IR which is not helped.  Patient is usually seen at [facility].  Past Medical History:  Diagnosis Date  . Arachnoid Cyst of brain bilaterally    "2 really small ones in the back of my head; inside; saw them w/MRI" (09/25/2012)  . Bacterial pneumonia ~ 2012   "caught it here in the hospital" (09/25/2012)  . Chronic kidney disease    "from my sickle cell" (09/25/2012)  . CKD (chronic kidney disease), stage II   . GERD (gastroesophageal reflux disease)    "after I eat alot of spicey foods" (09/25/2012)  . Gynecomastia, male 07/10/2012  . History of blood transfusion    "always related to sickle cell crisis" (09/25/2012)  . 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   Noted in noted from Hematology notes at [facility]  . Migraines    "take RX qd to prevent them" (09/25/2012)  . Sickle cell anemia (HCC)   . Sickle cell crisis ([place]) 09/25/2012  . Sickle cell nephropathy ([place]) 07/10/2012  . Sinus tachycardia   . Tachycardia with heart rate 121-140 beats per minute with ambulation 08/04/2016    Patient Active Problem List   Diagnosis Date Noted  . Vitamin D deficiency 05/13/2017  . Sickle cell anemia with crisis ([place]) 05/12/2017  . LLQ abdominal pain   . Nephrotic syndrome   . Abnormal CT of the abdomen   . Immune-complex glomerulonephritis   . Other ascites   . RUQ pain   . Nausea vomiting and diarrhea 04/20/2017  . Colitis 04/07/2017  . Abnormal  liver function   . HCAP (healthcare-associated pneumonia)   . QT prolongation   . Pneumonia 02/27/2017  . Elevated troponin 02/27/2017  . Diarrhea 02/27/2017  . Soft tissue swelling of chest wall 12/18/2016  . Hypoxia   . Acute kidney injury superimposed on chronic kidney disease ([place]) 12/13/2016  . Vasoocclusive sickle cell crisis ([place]) 12/13/2016  . Sickle cell crisis ([place]) 10/14/2016  . Hyponatremia 10/14/2016  . Tachycardia with heart rate 121-140 beats per minute with ambulation 08/04/2016  . Metabolic acidosis 02/21/3233  . Leucocytosis 08/02/2016  . Anemia 08/02/2016  . Macrocytosis due to Hydroxyurea 08/02/2016  . Acute renal failure superimposed on chronic kidney disease ([place])   . AKI (acute kidney injury) ([place])   . Sickle cell anemia with pain ([place]) 03/19/2014  . Sickle cell pain crisis ([place]) 01/24/2013  . Chronic, continuous use of opioids 08/30/2012  . Chronic headaches 07/10/2012  . Gynecomastia, male 07/10/2012  . Sickle cell nephropathy ([place]) 07/10/2012  . Tachycardia 12/08/2011  . Systolic murmur 57/32/2025  . SICKLE CELL CRISIS 01/04/2010  . Migraine 11/26/2009  . Chronic kidney disease 03/06/2009  . Sickle cell disease, type SS.  06/18/2008  . TOBACCO ABUSE 05/22/2007    Past Surgical History:  Procedure Laterality Date  . CHOLECYSTECTOMY  ~ 2012  . COLONOSCOPY N/A 04/23/2017   Procedure: COLONOSCOPY;  Surgeon: Henrene Pastor,  Docia Chuck, MD;  Location: [facility];  Service: Endoscopy;  Laterality: N/A;  . IR FLUORO GUIDE CV LINE RIGHT  12/17/2016  . IR REMOVAL TUN CV CATH W/O FL  12/21/2016  . IR US GUIDE VASC ACCESS RIGHT  12/17/2016  . spleenectomy          Home Medications    Prior to Admission medications   Medication Sig Start Date End Date Taking? Authorizing Provider  Cholecalciferol (VITAMIN D) 2000 units tablet Take 2,000 Units by mouth 2 (two) times daily. 06/28/17  Yes [provider]  folic acid (FOLVITE) 1 MG tablet Take 1 tablet (1 mg  total) daily by mouth. 12/22/16  Yes Leana Gamer, MD  hydroxyurea (HYDREA) 500 MG capsule Take 1,500 mg by mouth at bedtime. May take with food to minimize GI side effects.    Yes [provider]  nortriptyline (PAMELOR) 25 MG capsule TAKE 1 CAPSULE (25 MG TOTAL) BY MOUTH AT BEDTIME. 01/18/17  Yes Donnamae Jude, MD  oxyCODONE (OXYCONTIN) 15 mg 12 hr tablet Take 1 tablet (15 mg total) by mouth every 12 (twelve) hours. 07/11/17  Yes [name], FNP  Oxycodone HCl 10 MG TABS Take 1 tablet (10 mg total) by mouth every 6 (six) hours as needed. 07/11/17  Yes [name], FNP  diltiazem (CARDIZEM) 30 MG tablet TAKE 1 TABLET (30 MG TOTAL) BY MOUTH EVERY 12 (TWELVE) HOURS. Patient not taking: Reported on 07/16/2017 06/13/17   [name], FNP  ergocalciferol (DRISDOL) 50000 units capsule Take 1 capsule (50,000 Units total) by mouth once a week. Patient not taking: Reported on 07/23/2017 05/13/17   [name], FNP  sodium bicarbonate 650 MG tablet Take 1 tablet (650 mg total) 3 (three) times daily by mouth. Patient not taking: Reported on 06/13/2017 12/21/16   Leana Gamer, MD    Family History Family History  Problem Relation Age of Onset  . Breast cancer Mother     Social History Social History   Tobacco Use  . Smoking status: Current Some Day Smoker    Types: Cigarettes  . Smokeless tobacco: Never Used  . Tobacco comment: 09/25/2012 "I don't buy cigarettes; bum one from friends q now and then"  Substance Use Topics  . Alcohol use: No    Alcohol/week: 0.0 oz    Frequency: Never  . Drug use: No    Types: Marijuana    Comment: 09/25/2012 "weed was my biggest problem; stopped that ~ 1 1/2 months ago; I don't do cocaine; can't say I never have though"     Allergies   Nsaids and Morphine and related   Review of Systems Review of Systems  Constitutional: Negative for activity change, fatigue and fever.  HENT: Negative for congestion.   Respiratory:  Negative for cough, chest tightness and shortness of breath.   Cardiovascular: Positive for chest pain.  Gastrointestinal: Negative for abdominal pain.  All other systems reviewed and are negative.    Physical Exam Updated Vital Signs BP 114/73 (BP Location: Right Arm)   Pulse (!) 102   Temp 98 F (36.7 C) (Oral)   Resp 16   Wt 59 kg (130 lb)   SpO2 97%   BMI 23.03 kg/m   Physical Exam  Constitutional: He is oriented to person, place, and time. He appears well-nourished.  HENT:  Head: Normocephalic and atraumatic.  Eyes: Conjunctivae and EOM are normal.  Cardiovascular: Normal rate, regular rhythm and normal heart sounds.  Pulmonary/Chest: Effort normal and breath sounds normal. No respiratory distress.  Abdominal: Soft. He exhibits no distension. There is no tenderness.  Musculoskeletal: He exhibits no edema.  Neurological: He is oriented to person, place, and time.  Skin: Skin is warm and dry. He is not diaphoretic.  Psychiatric: He has a normal mood and affect. His behavior is normal.     ED Treatments / Results  Labs (all labs ordered are listed, but only abnormal results are displayed) Labs Reviewed  COMPREHENSIVE METABOLIC PANEL - Abnormal; Notable for the following components:      Result Value   CO2 19 (*)    Glucose, Bld 107 (*)    BUN 22 (*)    Creatinine, Ser 2.24 (*)    Calcium 7.4 (*)    Total Protein 5.1 (*)    Albumin 1.7 (*)    AST 64 (*)    Alkaline Phosphatase 318 (*)    Total Bilirubin 1.8 (*)    GFR calc non Af Amer 36 (*)    GFR calc Af Amer 42 (*)    All other components within normal limits  CBC WITH DIFFERENTIAL/PLATELET - Abnormal; Notable for the following components:   RBC 1.64 (*)    Hemoglobin 6.2 (*)    HCT 17.7 (*)    MCV 107.9 (*)    MCH 37.8 (*)    nRBC 2 (*)    Lymphs Abs 4.4 (*)    All other components within normal limits  RETICULOCYTES - Abnormal; Notable for the following components:   Retic Ct Pct 13.5 (*)    RBC.  1.64 (*)    Retic Count, Absolute 221.4 (*)    All other components within normal limits  TYPE AND SCREEN  PREPARE RBC (CROSSMATCH)    EKG EKG Interpretation  Date/Time:  Monday July 23 2017 17:04:38 EDT Ventricular Rate:  129 PR Interval:    QRS Duration: 69 QT Interval:  287 QTC Calculation: 421 R Axis:   76 Text Interpretation:  Sinus tachycardia Borderline T abnormalities, lateral leads Sinus tachycardia Confirmed by Zenovia Jarred [phone]) on 07/23/2017 6:28:49 PM   Radiology Dg Chest 2 View  Result Date: 07/23/2017 CLINICAL DATA:  Sickle cell crisis.  Pain. EXAM: CHEST - 2 VIEW COMPARISON:  May 15, 2017 FINDINGS: Mild increased opacity in the left base. The heart, hila, and mediastinum are stable. No other acute abnormalities. IMPRESSION: Mild increased opacity in the left base favored to represent atelectasis. Changes in the bones consistent with sickle cell. Electronically Signed   By: Dorise Bullion III M.D   On: 07/23/2017 19:45    Procedures Procedures (including critical care time)  CRITICAL CARE Performed by: Gardiner Sleeper Total critical care time: 65  minutes Critical care time was exclusive of separately billable procedures and treating other patients. Critical care was necessary to treat or prevent imminent or life-threatening deterioration. Critical care was time spent personally by me on the following activities: development of treatment plan with patient and/or surrogate as well as nursing, discussions with consultants, evaluation of patient's response to treatment, examination of patient, obtaining history from patient or surrogate, ordering and performing treatments and interventions, ordering and review of laboratory studies, ordering and review of radiographic studies, pulse oximetry and re-evaluation of patient's condition.   Medications Ordered in ED Medications  0.45 % sodium chloride infusion ( Intravenous New Bag/Given 07/23/17 1851)    HYDROmorphone (DILAUDID) injection 1 mg (has no administration in time range)    Or  HYDROmorphone (DILAUDID) injection 1 mg (has no administration in time range)  0.9 %  sodium chloride infusion (has no administration in time range)  vancomycin (VANCOCIN) IVPB 1000 mg/200 mL premix (has no administration in time range)  piperacillin-tazobactam (ZOSYN) IVPB 3.375 g (has no administration in time range)  HYDROmorphone (DILAUDID) injection 0.5 mg (0.5 mg Intravenous Given 07/23/17 1916)    Or  HYDROmorphone (DILAUDID) injection 0.5 mg ( Subcutaneous See Alternative 07/23/17 1916)  HYDROmorphone (DILAUDID) injection 1 mg (1 mg Intravenous Given 07/23/17 1852)    Or  HYDROmorphone (DILAUDID) injection 1 mg ( Subcutaneous See Alternative 07/23/17 1852)  HYDROmorphone (DILAUDID) injection 1 mg (1 mg Intravenous Given 07/23/17 2012)    Or  HYDROmorphone (DILAUDID) injection 1 mg ( Subcutaneous See Alternative 07/23/17 2012)     Initial Impression / Assessment and Plan / ED Course  I have reviewed the triage vital signs and the nursing notes.  Pertinent labs & imaging results that were available during my care of the patient were reviewed by me and considered in my medical decision making (see chart for details).    Patient is a 37 year old male with sickle cell disease presenting today with sickle cell appearing.  He reports is his normal pain crisis. Except patient has chest pain  That is atypical. Still has his usual back pain.  Denies any cough, congestion, fever.  He reports he took his normal oxycodone 15 mg IR which is not helped.  Patient is usually seen at [facility].  6:37 PM Will initiate with pain control.  Will get chest x-ray to rule out acute chest.  8:45 PM Patient has infiltrateon x-ray.  Given that has a high reticulocyte count, low hemoglobin.  We will transfuse and treat with antibiotics.  Final Clinical Impressions(s) / ED Diagnoses   Final diagnoses:  None    ED Discharge  Orders    None       Macarthur Critchley, MD 07/23/17 2046

## 2017-07-23 NOTE — ED Notes (Signed)
RN notified of abnormal lab 

## 2017-07-23 NOTE — ED Triage Notes (Signed)
Patient here from home with complaints of sickle cell pain crisis. Pain increased in back and chest. Home meds last night no relief.

## 2017-07-23 NOTE — Progress Notes (Signed)
Pharmacy Antibiotic Note  [name] [name]. is a 37 y.o. male admitted on 07/23/2017 with pneumonia.  Pharmacy has been consulted for zosyn and vancomycin dosing.  Plan: Zosyn 3.375g IV q8h (4 hour infusion).  Vancomycin 1 Gm x1 then 1250 mg IV q48h for est AUC = 491 Goal AUC = 400-500 Daily scr F/u cultures/levels  Height: 5\' 3"  (160 cm) Weight: 121 lb 11.1 oz (55.2 kg) IBW/kg (Calculated) : 56.9  Temp (24hrs), Avg:98.6 F (37 C), Min:98 F (36.7 C), Max:99.1 F (37.3 C)  Recent Labs  Lab 07/23/17 1725  WBC 9.1  CREATININE 2.24*    Estimated Creatinine Clearance: 36.3 mL/min (A) (by C-G formula based on SCr of 2.24 mg/dL (H)).    Allergies  Allergen Reactions  . Nsaids   . Morphine And Related Other (See Comments)    "real bad headaches"    Antimicrobials this admission: 6/10 zosyn >>  6/10 vancomycin >>   Dose adjustments this admission:   Microbiology results:  BCx:   UCx:    Sputum:    MRSA PCR:   Thank you for allowing pharmacy to be a part of this patient's care.  Dorrene German 07/23/2017 11:25 PM

## 2017-07-24 LAB — URINALYSIS, ROUTINE W REFLEX MICROSCOPIC
BACTERIA UA: NONE SEEN
BILIRUBIN URINE: NEGATIVE
GLUCOSE, UA: 150 mg/dL — AB
KETONES UR: NEGATIVE mg/dL
LEUKOCYTES UA: NEGATIVE
Nitrite: NEGATIVE
PH: 8 (ref 5.0–8.0)
Specific Gravity, Urine: 1.008 (ref 1.005–1.030)

## 2017-07-24 MED ORDER — HYDROMORPHONE 1 MG/ML IV SOLN
INTRAVENOUS | Status: DC
  Administered 2017-07-24: 4.5 mg via INTRAVENOUS
  Administered 2017-07-24: 1.5 mg via INTRAVENOUS
  Administered 2017-07-25: 5.5 mg via INTRAVENOUS
  Administered 2017-07-25: 3 mg via INTRAVENOUS
  Administered 2017-07-25: 2 mg via INTRAVENOUS
  Administered 2017-07-26: 3 mg via INTRAVENOUS
  Administered 2017-07-26: 3.5 mg via INTRAVENOUS
  Filled 2017-07-24: qty 25

## 2017-07-24 MED ORDER — POLYVINYL ALCOHOL 1.4 % OP SOLN
1.0000 [drp] | OPHTHALMIC | Status: DC | PRN
  Administered 2017-07-24: 1 [drp] via OPHTHALMIC
  Filled 2017-07-24: qty 15

## 2017-07-24 MED ORDER — IPRATROPIUM-ALBUTEROL 0.5-2.5 (3) MG/3ML IN SOLN: 3.0000 mL | RESPIRATORY_TRACT | Status: DC | PRN

## 2017-07-24 NOTE — Progress Notes (Signed)
Subjective: 37 year old gentleman admitted with sickle cell painful crisis. Patient has been placed on low dose Dilaudid PCA with 1 mg maximum dose hour.  Is not getting adequate pain management.  She is otherwise stable with no significant shortness of breath or cough no nausea vomiting or diarrhea.  Pain is still at 8 out of 10.  He cannot have Toradol due to allergies as well as chronic kidney disease.  Objective: Vital signs in last 24 hours: Temp:  [98 F (36.7 C)-99.1 F (37.3 C)] 98 F (36.7 C) (06/11 0317) Pulse Rate:  [89-132] 89 (06/11 0910) Resp:  [15-28] 15 (06/11 0910) BP: (105-118)/(64-81) 105/64 (06/11 0910) SpO2:  [95 %-100 %] 99 % (06/11 0340) Weight:  [55.2 kg (121 lb 11.1 oz)-59 kg (130 lb)] 55.2 kg (121 lb 11.1 oz) (06/10 2238) Weight change:  Last BM Date: 07/22/17  Intake/Output from previous day: 06/10 0701 - 06/11 0700 In: 1500 [I.V.:1135; Blood:315; IV Piggyback:50] Out: 600 [Urine:600] Intake/Output this shift: Total I/O In: -  Out: 625 [Urine:625]  General appearance: alert, cooperative, appears stated age and no distress Head: Normocephalic, without obvious abnormality, atraumatic Neck: no adenopathy, no carotid bruit, no JVD, supple, symmetrical, trachea midline and thyroid not enlarged, symmetric, no tenderness/mass/nodules Back: symmetric, no curvature. ROM normal. No CVA tenderness. Resp: clear to auscultation bilaterally Cardio: regular rate and rhythm, S1, S2 normal, no murmur, click, rub or gallop GI: soft, non-tender; bowel sounds normal; no masses,  no organomegaly Extremities: extremities normal, atraumatic, no cyanosis or edema Pulses: 2+ and symmetric Skin: Skin color, texture, turgor normal. No rashes or lesions Neurologic: Grossly normal  Lab Results: Recent Labs    07/23/17 1725  WBC 9.1  HGB 6.2*  HCT 17.7*  PLT 154   BMET Recent Labs    07/23/17 1725  NA 137  K 4.3  CL 111  CO2 19*  GLUCOSE 107*  BUN 22*  CREATININE  2.24*  CALCIUM 7.4*    Studies/Results: Dg Chest 2 View  Result Date: 07/23/2017 CLINICAL DATA:  Sickle cell crisis.  Pain. EXAM: CHEST - 2 VIEW COMPARISON:  May 15, 2017 FINDINGS: Mild increased opacity in the left base. The heart, hila, and mediastinum are stable. No other acute abnormalities. IMPRESSION: Mild increased opacity in the left base favored to represent atelectasis. Changes in the bones consistent with sickle cell. Electronically Signed   By: Dorise Bullion III M.D   On: 07/23/2017 19:45    Medications: I have reviewed the patient's current medications.  Assessment/Plan: At 37 year old gentleman admitted with sickle cell painful crisis.  #1 sickle cell painful crisis: Patient appears to be improving in pain.  However he started getting adequate pain management.  I will change his maximum dose to 3 mg daily 1 hour.  Continue supportive care with IV fluids.  Continue oral medications.  #2 sickle cell anemia: H&H appears stable.  Continue to monitor H&H.  #3 chronic kidney disease stage III: Appears to be at baseline.  Continue hydration  #4 GERD: Continue his PPI  LOS: 1 day   Caris Cerveny,LAWAL 07/24/2017, 9:12 AM

## 2017-07-24 NOTE — Progress Notes (Signed)
Nutrition Brief Note  Patient identified on the Malnutrition Screening Tool (MST) Report  Wt Readings from Last 15 Encounters:  07/23/17 121 lb 11.1 oz (55.2 kg)  07/16/17 128 lb (58.1 kg)  06/13/17 124 lb (56.2 kg)  05/18/17 110 lb (49.9 kg)  05/11/17 123 lb (55.8 kg)  05/02/17 121 lb 11.1 oz (55.2 kg)  04/07/17 125 lb (56.7 kg)  02/27/17 120 lb (54.4 kg)  12/15/16 117 lb 11.2 oz (53.4 kg)  10/16/16 135 lb 9.6 oz (61.5 kg)  08/01/16 135 lb (61.2 kg)  02/27/16 118 lb 9.7 oz (53.8 kg)  12/03/15 135 lb (61.2 kg)  09/20/15 135 lb (61.2 kg)  03/11/15 129 lb 15.7 oz (59 kg)   Patient with PMH significant for sickle cell anemia, CKD with sickle cell nephropathy, and GERD. Presents this admission sickle cell crisis with anemia. Pt denies any loss in appetite or significant wt loss. States his weight fluctuates from "month to month." RD observed pt eating bacon and grits without any complication. Pt refuses supplementation this stay.   Body mass index is 21.56 kg/m. Patient meets criteria for normal based on current BMI.   Current diet order is regular, patient is consuming approximately 100% of meals at this time. Labs and medications reviewed.   No nutrition interventions warranted at this time. If nutrition issues arise, please consult RD.   Mariana Single RD, LDN Clinical Nutrition Pager # [phone]

## 2017-07-25 DIAGNOSIS — K210 Gastro-esophageal reflux disease with esophagitis: Secondary | ICD-10-CM

## 2017-07-25 DIAGNOSIS — K219 Gastro-esophageal reflux disease without esophagitis: Secondary | ICD-10-CM | POA: Diagnosis present

## 2017-07-25 DIAGNOSIS — N08 Glomerular disorders in diseases classified elsewhere: Secondary | ICD-10-CM

## 2017-07-25 LAB — BPAM RBC
Blood Product Expiration Date: 201907132359
ISSUE DATE / TIME: 201906110029
UNIT TYPE AND RH: 5100

## 2017-07-25 LAB — TYPE AND SCREEN
ABO/RH(D): O POS
Antibody Screen: NEGATIVE
UNIT DIVISION: 0

## 2017-07-25 LAB — CREATININE, SERUM
Creatinine, Ser: 2.83 mg/dL — ABNORMAL HIGH (ref 0.61–1.24)
GFR, EST AFRICAN AMERICAN: 32 mL/min — AB (ref >60)
GFR, EST NON AFRICAN AMERICAN: 27 mL/min — AB (ref >60)

## 2017-07-25 LAB — HEMOGLOBIN AND HEMATOCRIT, BLOOD
HEMATOCRIT: 24.1 % — AB (ref 39.0–52.0)
Hemoglobin: 8.4 g/dL — ABNORMAL LOW (ref 13.0–17.0)

## 2017-07-25 MED ORDER — VANCOMYCIN HCL IN DEXTROSE 1-5 GM/200ML-% IV SOLN: 1000.0000 mg | INTRAVENOUS | Status: DC

## 2017-07-25 MED ORDER — OXYCODONE HCL 5 MG PO TABS
10.0000 mg | ORAL_TABLET | Freq: Four times a day (QID) | ORAL | Status: DC | PRN
  Administered 2017-07-25 – 2017-07-26 (×2): 10 mg via ORAL
  Filled 2017-07-25 (×2): qty 2

## 2017-07-25 NOTE — Progress Notes (Signed)
Subjective: Patient's pain is down to 6 out of 10 today.  He has been mainly in his lower back.  Denied any fever or chills.  No nausea vomiting or diarrhea.  He still on the Dilaudid PCA and has used about 22 mg overnight.  Objective: Vital signs in last 24 hours: Temp:  [98 F (36.7 C)-98.9 F (37.2 C)] 98.2 F (36.8 C) (06/12 1348) Pulse Rate:  [82-102] 82 (06/12 1348) Resp:  [13-23] 16 (06/12 1348) BP: (91-111)/(57-77) 91/57 (06/12 1348) SpO2:  [94 %-100 %] 98 % (06/12 1348) Weight change:  Last BM Date: 07/24/17  Intake/Output from previous day: 06/11 0701 - 06/12 0700 In: 5160 [P.O.:960; I.V.:4200] Out: 2550 [Urine:2550] Intake/Output this shift: Total I/O In: -  Out: 1175 [Urine:1175]  General appearance: alert, cooperative, appears stated age and no distress Head: Normocephalic, without obvious abnormality, atraumatic Neck: no adenopathy, no carotid bruit, no JVD, supple, symmetrical, trachea midline and thyroid not enlarged, symmetric, no tenderness/mass/nodules Back: symmetric, no curvature. ROM normal. No CVA tenderness. Resp: clear to auscultation bilaterally Cardio: regular rate and rhythm, S1, S2 normal, no murmur, click, rub or gallop GI: soft, non-tender; bowel sounds normal; no masses,  no organomegaly Extremities: extremities normal, atraumatic, no cyanosis or edema Pulses: 2+ and symmetric Skin: Skin color, texture, turgor normal. No rashes or lesions Neurologic: Grossly normal  Lab Results: Recent Labs    07/23/17 1725  WBC 9.1  HGB 6.2*  HCT 17.7*  PLT 154   BMET Recent Labs    07/23/17 1725 07/25/17 0429  NA 137  --   K 4.3  --   CL 111  --   CO2 19*  --   GLUCOSE 107*  --   BUN 22*  --   CREATININE 2.24* 2.83*  CALCIUM 7.4*  --     Studies/Results: Dg Chest 2 View  Result Date: 07/23/2017 CLINICAL DATA:  Sickle cell crisis.  Pain. EXAM: CHEST - 2 VIEW COMPARISON:  May 15, 2017 FINDINGS: Mild increased opacity in the left base.  The heart, hila, and mediastinum are stable. No other acute abnormalities. IMPRESSION: Mild increased opacity in the left base favored to represent atelectasis. Changes in the bones consistent with sickle cell. Electronically Signed   By: Dorise Bullion III M.D   On: 07/23/2017 19:45    Medications: I have reviewed the patient's current medications.  Assessment/Plan: At 37 year old gentleman admitted with sickle cell painful crisis.  #1 sickle cell painful crisis: Continue current regimen of Dilaudid PCA.  No Toradol or ibuprofen.  Restart short acting home medication.  Continue supportive care with IV fluids.  Continue oral medications.  #2 sickle cell anemia: recheck H&H in the morning  #3 chronic kidney disease stage III: Appears to be at baseline.  Continue hydration  #4 GERD: Continue his PPI   LOS: 2 days   Shalonda Sachse,LAWAL 07/25/2017, 6:18 PM

## 2017-07-26 LAB — CBC WITH DIFFERENTIAL/PLATELET
Basophils Absolute: 0 10*3/uL (ref 0.0–0.1)
Basophils Relative: 0 %
EOS PCT: 4 %
Eosinophils Absolute: 0.4 10*3/uL (ref 0.0–0.7)
HEMATOCRIT: 22.8 % — AB (ref 39.0–52.0)
HEMOGLOBIN: 7.8 g/dL — AB (ref 13.0–17.0)
LYMPHS PCT: 62 %
Lymphs Abs: 5.7 10*3/uL — ABNORMAL HIGH (ref 0.7–4.0)
MCH: 34.1 pg — ABNORMAL HIGH (ref 26.0–34.0)
MCHC: 34.2 g/dL (ref 30.0–36.0)
MCV: 99.6 fL (ref 78.0–100.0)
MONOS PCT: 4 %
Monocytes Absolute: 0.4 10*3/uL (ref 0.1–1.0)
NEUTROS PCT: 30 %
Neutro Abs: 2.8 10*3/uL (ref 1.7–7.7)
PLATELETS: 196 10*3/uL (ref 150–400)
RBC: 2.29 MIL/uL — ABNORMAL LOW (ref 4.22–5.81)
RDW: 29.6 % — ABNORMAL HIGH (ref 11.5–15.5)
WBC: 9.3 10*3/uL (ref 4.0–10.5)

## 2017-07-26 LAB — COMPREHENSIVE METABOLIC PANEL
ALK PHOS: 284 U/L — AB (ref 38–126)
ALT: 30 U/L (ref 17–63)
ANION GAP: 5 (ref 5–15)
AST: 49 U/L — ABNORMAL HIGH (ref 15–41)
Albumin: 1.8 g/dL — ABNORMAL LOW (ref 3.5–5.0)
BILIRUBIN TOTAL: 1.2 mg/dL (ref 0.3–1.2)
BUN: 22 mg/dL — ABNORMAL HIGH (ref 6–20)
CALCIUM: 8.3 mg/dL — AB (ref 8.9–10.3)
CO2: 21 mmol/L — AB (ref 22–32)
CREATININE: 2.9 mg/dL — AB (ref 0.61–1.24)
Chloride: 111 mmol/L (ref 101–111)
GFR calc Af Amer: 31 mL/min — ABNORMAL LOW (ref >60)
GFR calc non Af Amer: 27 mL/min — ABNORMAL LOW (ref >60)
Glucose, Bld: 100 mg/dL — ABNORMAL HIGH (ref 65–99)
Potassium: 4.4 mmol/L (ref 3.5–5.1)
Sodium: 137 mmol/L (ref 135–145)
TOTAL PROTEIN: 5.6 g/dL — AB (ref 6.5–8.1)

## 2017-07-26 NOTE — Discharge Summary (Signed)
Physician Discharge Summary  Patient ID: [name] [name]. MRN: [id number] DOB/AGE: 06-17-1980 37 y.o.  Admit date: 07/23/2017 Discharge date: 07/26/2017  Admission Diagnoses:  Discharge Diagnoses:  Active Problems:   Chronic kidney disease   Sickle cell nephropathy (HCC)   Sickle cell crisis (HCC)   GERD (gastroesophageal reflux disease)   Discharged Condition: good  Hospital Course: patient was admitted with sickle cell painful crisis.  He also has chronic kidney disease.  He was suspected to have had pneumonia based on his chest x-ray and upper respiratory tract symptoms.  Patient was treated with vancomycin and Zosyn which led to slight worsening of his renal function.  At this point however he is much stable.  Pain has been controlled.  Patient has had to take extra pain medications at home and was asking for early refill of his pain medications.  I have deferred him to his primary care physician's.  Problems.  His creatinine is now at 2.9 and will need to have follow-up view and and creatinine checks in 3-4 days.  Patient advised to keep up with fluids.  Consults: None  Significant Diagnostic Studies: labs: CBCs and CMP sweats checked.  Crying went to 2.9.  He was on Vanco and Zosyn which was Contributed.  Treatments: IV hydration, antibiotics: vancomycin and Zosyn and analgesia: acetaminophen and Dilaudid  Discharge Exam: Blood pressure 108/81, pulse 93, temperature 98.1 F (36.7 C), temperature source Oral, resp. rate 10, height 5\' 3"  (1.6 m), weight 55.2 kg (121 lb 11.1 oz), SpO2 99 %. General appearance: alert, cooperative and appears stated age Neck: no adenopathy, no carotid bruit, no JVD, supple, symmetrical, trachea midline and thyroid not enlarged, symmetric, no tenderness/mass/nodules Back: symmetric, no curvature. ROM normal. No CVA tenderness. Resp: clear to auscultation bilaterally Cardio: regular rate and rhythm, S1, S2 normal, no murmur, click, rub or  gallop GI: soft, non-tender; bowel sounds normal; no masses,  no organomegaly Extremities: extremities normal, atraumatic, no cyanosis or edema Pulses: 2+ and symmetric Skin: Skin color, texture, turgor normal. No rashes or lesions Neurologic: Grossly normal  Disposition: Discharge disposition: 01-Home or Self Care       Discharge Instructions    Diet - low sodium heart healthy   Complete by:  As directed    Increase activity slowly   Complete by:  As directed      Allergies as of 07/26/2017      Reactions   Nsaids    Morphine And Related Other (See Comments)   "real bad headaches"      Medication List    TAKE these medications   diltiazem 30 MG tablet Commonly known as:  CARDIZEM TAKE 1 TABLET (30 MG TOTAL) BY MOUTH EVERY 12 (TWELVE) HOURS.   ergocalciferol 50000 units capsule Commonly known as:  DRISDOL Take 1 capsule (50,000 Units total) by mouth once a week.   folic acid 1 MG tablet Commonly known as:  FOLVITE Take 1 tablet (1 mg total) daily by mouth.   hydroxyurea 500 MG capsule Commonly known as:  HYDREA Take 1,500 mg by mouth at bedtime. May take with food to minimize GI side effects.   nortriptyline 25 MG capsule Commonly known as:  PAMELOR TAKE 1 CAPSULE (25 MG TOTAL) BY MOUTH AT BEDTIME.   oxyCODONE 15 mg 12 hr tablet Commonly known as:  OXYCONTIN Take 1 tablet (15 mg total) by mouth every 12 (twelve) hours.   Oxycodone HCl 10 MG Tabs Take 1 tablet (10 mg total) by mouth  every 6 (six) hours as needed.   sodium bicarbonate 650 MG tablet Take 1 tablet (650 mg total) 3 (three) times daily by mouth.   Vitamin D 2000 units tablet Take 2,000 Units by mouth 2 (two) times daily.        SignedBarbette Merino 07/26/2017, 11:23 AM   Time spent 37 minutes

## 2017-07-30 ENCOUNTER — Encounter (INDEPENDENT_AMBULATORY_CARE_PROVIDER_SITE_OTHER): Payer: Self-pay | Admitting: Ophthalmology

## 2017-07-31 DIAGNOSIS — E872 Acidosis: Secondary | ICD-10-CM | POA: Diagnosis not present

## 2017-07-31 DIAGNOSIS — R945 Abnormal results of liver function studies: Secondary | ICD-10-CM | POA: Diagnosis not present

## 2017-07-31 DIAGNOSIS — D7589 Other specified diseases of blood and blood-forming organs: Secondary | ICD-10-CM | POA: Diagnosis not present

## 2017-07-31 DIAGNOSIS — N183 Chronic kidney disease, stage 3 (moderate): Secondary | ICD-10-CM | POA: Diagnosis not present

## 2017-07-31 DIAGNOSIS — D571 Sickle-cell disease without crisis: Secondary | ICD-10-CM | POA: Diagnosis not present

## 2017-07-31 DIAGNOSIS — Z720 Tobacco use: Secondary | ICD-10-CM | POA: Diagnosis not present

## 2017-07-31 DIAGNOSIS — N08 Glomerular disorders in diseases classified elsewhere: Secondary | ICD-10-CM | POA: Diagnosis not present

## 2017-08-06 ENCOUNTER — Other Ambulatory Visit: Payer: Self-pay | Admitting: Family Medicine

## 2017-08-06 ENCOUNTER — Telehealth: Payer: Self-pay

## 2017-08-06 DIAGNOSIS — G894 Chronic pain syndrome: Secondary | ICD-10-CM

## 2017-08-06 DIAGNOSIS — D571 Sickle-cell disease without crisis: Secondary | ICD-10-CM

## 2017-08-06 DIAGNOSIS — N08 Glomerular disorders in diseases classified elsewhere: Principal | ICD-10-CM

## 2017-08-06 MED ORDER — OXYCODONE HCL ER 15 MG PO T12A: 15.0000 mg | EXTENDED_RELEASE_TABLET | Freq: Two times a day (BID) | ORAL | 0 refills | Status: DC

## 2017-08-06 MED ORDER — OXYCODONE HCL 10 MG PO TABS: 10.0000 mg | ORAL_TABLET | Freq: Four times a day (QID) | ORAL | 0 refills | Status: DC | PRN

## 2017-08-06 NOTE — Progress Notes (Signed)
Reviewed [place] Substance Reporting system prior to prescribing opiate medications. No inconsistencies noted.   Meds ordered this encounter  Medications  . oxyCODONE (OXYCONTIN) 15 mg 12 hr tablet    Sig: Take 1 tablet (15 mg total) by mouth every 12 (twelve) hours.    Dispense:  60 tablet    Refill:  0    Rx not to be filled prior to 08/11/2017    Order Specific Question:   Supervising Provider    Answer:   [name] [[id number]]  . Oxycodone HCl 10 MG TABS    Sig: Take 1 tablet (10 mg total) by mouth every 6 (six) hours as needed.    Dispense:  60 tablet    Refill:  0    Order Specific Question:   Supervising Provider    Answer:   [name] [[id number]]     [name]  MSN, FNP-C Patient [facility] [address] [place], [place] [address] [phone]

## 2017-08-08 ENCOUNTER — Telehealth: Payer: Self-pay

## 2017-08-09 ENCOUNTER — Other Ambulatory Visit: Payer: Self-pay | Admitting: Family Medicine

## 2017-08-09 DIAGNOSIS — D571 Sickle-cell disease without crisis: Secondary | ICD-10-CM

## 2017-08-09 DIAGNOSIS — G894 Chronic pain syndrome: Secondary | ICD-10-CM

## 2017-08-09 DIAGNOSIS — N08 Glomerular disorders in diseases classified elsewhere: Principal | ICD-10-CM

## 2017-08-13 ENCOUNTER — Telehealth: Payer: Self-pay

## 2017-08-13 ENCOUNTER — Other Ambulatory Visit: Payer: Self-pay | Admitting: Family Medicine

## 2017-08-14 NOTE — Telephone Encounter (Signed)
Called and spoke with patient. Advised that rx was sent to [facility] [address] on [facility]/11/2017. He was calling [address]. Patient will pick up from [address]. Thanks!

## 2017-08-15 ENCOUNTER — Encounter: Payer: Self-pay | Admitting: Family Medicine

## 2017-08-15 ENCOUNTER — Ambulatory Visit: Payer: Medicare Other | Admitting: Family Medicine

## 2017-08-15 DIAGNOSIS — G894 Chronic pain syndrome: Secondary | ICD-10-CM | POA: Insufficient documentation

## 2017-08-20 DIAGNOSIS — R945 Abnormal results of liver function studies: Secondary | ICD-10-CM | POA: Diagnosis not present

## 2017-08-20 DIAGNOSIS — N183 Chronic kidney disease, stage 3 (moderate): Secondary | ICD-10-CM | POA: Diagnosis not present

## 2017-08-23 DIAGNOSIS — Z79899 Other long term (current) drug therapy: Secondary | ICD-10-CM | POA: Diagnosis not present

## 2017-08-23 DIAGNOSIS — D571 Sickle-cell disease without crisis: Secondary | ICD-10-CM | POA: Diagnosis not present

## 2017-08-23 DIAGNOSIS — R809 Proteinuria, unspecified: Secondary | ICD-10-CM | POA: Diagnosis not present

## 2017-08-23 DIAGNOSIS — R0602 Shortness of breath: Secondary | ICD-10-CM | POA: Diagnosis not present

## 2017-08-23 DIAGNOSIS — R7303 Prediabetes: Secondary | ICD-10-CM | POA: Diagnosis not present

## 2017-08-23 DIAGNOSIS — Z23 Encounter for immunization: Secondary | ICD-10-CM | POA: Diagnosis not present

## 2017-08-23 DIAGNOSIS — R7989 Other specified abnormal findings of blood chemistry: Secondary | ICD-10-CM | POA: Diagnosis not present

## 2017-08-29 ENCOUNTER — Other Ambulatory Visit: Payer: Self-pay | Admitting: Family Medicine

## 2017-08-31 ENCOUNTER — Ambulatory Visit: Payer: Medicare Other | Admitting: Cardiology

## 2017-09-01 ENCOUNTER — Other Ambulatory Visit: Payer: Self-pay | Admitting: Family Medicine

## 2017-09-03 ENCOUNTER — Encounter: Payer: Self-pay | Admitting: *Deleted

## 2017-09-14 ENCOUNTER — Ambulatory Visit (INDEPENDENT_AMBULATORY_CARE_PROVIDER_SITE_OTHER): Payer: Medicare Other | Admitting: Family Medicine

## 2017-09-14 DIAGNOSIS — N08 Glomerular disorders in diseases classified elsewhere: Secondary | ICD-10-CM

## 2017-09-14 DIAGNOSIS — D571 Sickle-cell disease without crisis: Secondary | ICD-10-CM

## 2017-09-14 DIAGNOSIS — G894 Chronic pain syndrome: Secondary | ICD-10-CM | POA: Diagnosis not present

## 2017-09-14 MED ORDER — OXYCODONE HCL 10 MG PO TABS: 10.0000 mg | ORAL_TABLET | Freq: Four times a day (QID) | ORAL | 0 refills | Status: AC | PRN

## 2017-09-14 MED ORDER — OXYCODONE HCL ER 15 MG PO T12A: 15.0000 mg | EXTENDED_RELEASE_TABLET | Freq: Two times a day (BID) | ORAL | 0 refills | Status: DC

## 2017-09-14 NOTE — Patient Instructions (Signed)
Sickle Cell Anemia, Adult °Sickle cell anemia is a condition where your red blood cells are shaped like sickles. Red blood cells carry oxygen through the body. Sickle-shaped red blood cells do not live as long as normal red blood cells. They also clump together and block blood from flowing through the blood vessels. These things prevent the body from getting enough oxygen. Sickle cell anemia causes organ damage and pain. It also increases the risk of infection. °Follow these instructions at home: °· Drink enough fluid to keep your pee (urine) clear or pale yellow. Drink more in hot weather and during exercise. °· Do not smoke. Smoking lowers oxygen levels in the blood. °· Only take over-the-counter or prescription medicines as told by your doctor. °· Take antibiotic medicines as told by your doctor. Make sure you finish them even if you start to feel better. °· Take supplements as told by your doctor. °· Consider wearing a medical alert bracelet. This tells anyone caring for you in an emergency of your condition. °· When traveling, keep your medical information, doctors' names, and the medicines you take with you at all times. °· If you have a fever, do not take fever medicines right away. This could cover up a problem. Tell your doctor. °· Keep all follow-up visits with your doctor. Sickle cell anemia requires regular medical care. °Contact a doctor if: °You have a fever. °Get help right away if: °· You feel dizzy or faint. °· You have new belly (abdominal) pain, especially on the left side near the stomach area. °· You have a lasting, often uncomfortable and painful erection of the penis (priapism). If it is not treated right away, you will become unable to have sex (impotence). °· You have numbness in your arms or legs or you have a hard time moving them. °· You have a hard time talking. °· You have a fever or lasting symptoms for more than 2-3 days. °· You have a fever and your symptoms suddenly get  worse. °· You have signs or symptoms of infection. These include: °? Chills. °? Being more tired than normal (lethargy). °? Irritability. °? Poor eating. °? Throwing up (vomiting). °· You have pain that is not helped with medicine. °· You have shortness of breath. °· You have pain in your chest. °· You are coughing up pus-like or bloody mucus. °· You have a stiff neck. °· Your feet or hands swell or have pain. °· Your belly looks bloated. °· Your joints hurt. °This information is not intended to replace advice given to you by your health care provider. Make sure you discuss any questions you have with your health care provider. °Document Released: 11/20/2012 Document Revised: 07/08/2015 Document Reviewed: 09/11/2012 °Elsevier Interactive Patient Education © 2017 [employer] ° °

## 2017-09-14 NOTE — Progress Notes (Signed)
Subjective   [name] [name]. 37 y.o. male  [id number]  [id number]  [birth year] 37 y.o.     Chief Complaint  Patient presents with  . Sickle Cell Anemia    Patient presents for follow up on sickle cell anemia. Patient with hx of CKD and tachycardia. Patient states that his pain is at baseline of 5/10 today. Will need refills on medications.  No other problems or concerns.    Past Medical History:  Diagnosis Date  . Arachnoid Cyst of brain bilaterally    "2 really small ones in the back of my head; inside; saw them w/MRI" (09/25/2012)  . Bacterial pneumonia ~ 2012   "caught it here in the hospital" (09/25/2012)  . Chronic kidney disease    "from my sickle cell" (09/25/2012)  . CKD (chronic kidney disease), stage II   . GERD (gastroesophageal reflux disease)    "after I eat alot of spicey foods" (09/25/2012)  . Gynecomastia, male 07/10/2012  . History of blood transfusion    "always related to sickle cell crisis" (09/25/2012)  . 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   Noted in noted from H[facility] at [facility]  . Migraines    "take RX qd to prevent them" (09/25/2012)  . Sickle cell anemia (HCC)   . Sickle cell crisis ([place]) 09/25/2012  . Sickle cell nephropathy ([place]) 07/10/2012  . Sinus tachycardia   . Tachycardia with heart rate 121-140 beats per minute with ambulation 08/04/2016    Social History   Socioeconomic History  . Marital status: Single    Spouse name: Not on file  . Number of children: Not on file  . Years of education: Not on file  . Highest education level: Not on file  Occupational History  . Not on file  Social Needs  . Financial resource strain: Not on file  . Food insecurity:    Worry: Not on file    Inability: Not on file  . Transportation needs:    Medical: Not on file    Non-medical: Not on file  Tobacco Use  . Smoking status: Current Some Day Smoker    Types: Cigarettes  . Smokeless tobacco: Never Used  .  Tobacco comment: 09/25/2012 "I don't buy cigarettes; bum one from friends q now and then"  Substance and Sexual Activity  . Alcohol use: No    Alcohol/week: 0.0 oz    Frequency: Never  . Drug use: No    Types: Marijuana    Comment: 09/25/2012 "weed was my biggest problem; stopped that ~ 1 1/2 months ago; I don't do cocaine; can't say I never have though"  . Sexual activity: Yes  Lifestyle  . Physical activity:    Days per week: Not on file    Minutes per session: Not on file  . Stress: Not on file  Relationships  . Social connections:    Talks on phone: Not on file    Gets together: Not on file    Attends religious service: Not on file    Active member of club or organization: Not on file    Attends meetings of clubs or organizations: Not on file    Relationship status: Not on file  . Intimate partner violence:    Fear of current or ex partner: Not on file    Emotionally abused: Not on file    Physically abused: Not on file    Forced sexual activity: Not on file  Other Topics Concern  .  Not on file  Social History Narrative    Lives alone in [place].   Back at school, studying Public relations account executive. Unemployed.    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       Review of Systems  Constitutional: Negative.   HENT: Negative.   Eyes: Negative.   Respiratory: Negative.   Cardiovascular: Negative.   Gastrointestinal: Negative.   Genitourinary: Negative.   Musculoskeletal: Positive for joint pain and myalgias.  Skin: Negative.   Neurological: Negative.   Psychiatric/Behavioral: Negative.     Objective   Physical Exam  Constitutional: He is oriented to person, place, and time. He appears well-developed and well-nourished.  HENT:  Head: Normocephalic and atraumatic.  Right Ear: External ear normal.  Left Ear: External ear normal.   Nose: Nose normal.  Mouth/Throat: Oropharynx is clear and moist.  Eyes: Pupils are equal, round, and reactive to light. Conjunctivae and EOM are normal.  Neck: Normal range of motion. Neck supple. No tracheal deviation present. No thyromegaly present.  Cardiovascular: Normal rate, regular rhythm, normal heart sounds and intact distal pulses. Exam reveals no friction rub.  No murmur heard. Pulmonary/Chest: Effort normal and breath sounds normal. No stridor. No respiratory distress. He has no wheezes.  Abdominal: Soft. Bowel sounds are normal. He exhibits no distension and no mass. There is no tenderness.  Musculoskeletal: Normal range of motion. He exhibits tenderness (lower extremities).  Lymphadenopathy:    He has no cervical adenopathy.  Neurological: He is alert and oriented to person, place, and time.  Skin: Skin is warm and dry.  Psychiatric: He has a normal mood and affect. His behavior is normal. Judgment and thought content normal.  Nursing note and vitals reviewed.   BP 120/70 (BP Location: Left Arm, Patient Position: Sitting, Cuff Size: Normal)   Pulse (!) 130   Temp 98.9 F (37.2 C) (Oral)   Resp 16   Ht 5\' 3"  (1.6 m)   Wt 121 lb (54.9 kg)   SpO2 100%   BMI 21.43 kg/m   Assessment   Encounter Diagnoses  Name Primary?  . Sickle cell nephropathy without crisis ([place])   . Chronic pain syndrome      Plan  1. Sickle cell nephropathy without crisis (HCC)  - oxyCODONE (OXYCONTIN) 15 mg 12 hr tablet; Take 1 tablet (15 mg total) by mouth every 12 (twelve) hours.  Dispense: 60 tablet; Refill: 0 - Oxycodone HCl 10 MG TABS; Take 1 tablet (10 mg total) by mouth every 6 (six) hours as needed for up to 15 days.  Dispense: 60 tablet; Refill: 0  2. Chronic pain syndrome - oxyCODONE (OXYCONTIN) 15 mg 12 hr tablet; Take 1 tablet (15 mg total) by mouth every 12 (twelve) hours.  Dispense: 60 tablet; Refill: 0 - Oxycodone HCl 10 MG TABS; Take 1 tablet (10 mg total) by mouth every 6  (six) hours as needed for up to 15 days.  Dispense: 60 tablet; Refill: 0   The patient was given clear instructions to go to ER or return to medical center if symptoms do not improve, worsen or new problems develop. The patient verbalized understanding and agreed with plan of care.   Ms. Doug Sou. Nathaneil Canary, FNP-BC [facility] [place], [place] [address] [phone]     This note has been  created with Surveyor, quantity. Any transcriptional errors are unintentional.

## 2017-09-17 ENCOUNTER — Encounter: Payer: Self-pay | Admitting: Family Medicine

## 2017-09-18 ENCOUNTER — Ambulatory Visit: Payer: Medicare Other | Admitting: Cardiovascular Disease

## 2017-09-19 ENCOUNTER — Encounter (HOSPITAL_COMMUNITY): Payer: Self-pay | Admitting: Emergency Medicine

## 2017-09-19 ENCOUNTER — Inpatient Hospital Stay (HOSPITAL_COMMUNITY)
Admission: EM | Admit: 2017-09-19 | Discharge: 2017-09-22 | DRG: 812 | Disposition: A | Discharge status: Home / Self Care | Payer: Medicare Other | Attending: Internal Medicine | Admitting: Internal Medicine

## 2017-09-19 ENCOUNTER — Other Ambulatory Visit: Payer: Self-pay

## 2017-09-19 ENCOUNTER — Emergency Department (HOSPITAL_COMMUNITY): Payer: Medicare Other

## 2017-09-19 DIAGNOSIS — Z79899 Other long term (current) drug therapy: Secondary | ICD-10-CM | POA: Diagnosis not present

## 2017-09-19 DIAGNOSIS — N179 Acute kidney failure, unspecified: Secondary | ICD-10-CM | POA: Diagnosis present

## 2017-09-19 DIAGNOSIS — F17200 Nicotine dependence, unspecified, uncomplicated: Secondary | ICD-10-CM | POA: Diagnosis present

## 2017-09-19 DIAGNOSIS — Z8701 Personal history of pneumonia (recurrent): Secondary | ICD-10-CM | POA: Diagnosis not present

## 2017-09-19 DIAGNOSIS — Z9049 Acquired absence of other specified parts of digestive tract: Secondary | ICD-10-CM

## 2017-09-19 DIAGNOSIS — R079 Chest pain, unspecified: Secondary | ICD-10-CM

## 2017-09-19 DIAGNOSIS — R7989 Other specified abnormal findings of blood chemistry: Secondary | ICD-10-CM | POA: Diagnosis not present

## 2017-09-19 DIAGNOSIS — N62 Hypertrophy of breast: Secondary | ICD-10-CM | POA: Diagnosis present

## 2017-09-19 DIAGNOSIS — D571 Sickle-cell disease without crisis: Secondary | ICD-10-CM | POA: Diagnosis present

## 2017-09-19 DIAGNOSIS — R778 Other specified abnormalities of plasma proteins: Secondary | ICD-10-CM

## 2017-09-19 DIAGNOSIS — G43909 Migraine, unspecified, not intractable, without status migrainosus: Secondary | ICD-10-CM | POA: Diagnosis present

## 2017-09-19 DIAGNOSIS — N183 Chronic kidney disease, stage 3 (moderate): Secondary | ICD-10-CM | POA: Diagnosis present

## 2017-09-19 DIAGNOSIS — G894 Chronic pain syndrome: Secondary | ICD-10-CM | POA: Diagnosis present

## 2017-09-19 DIAGNOSIS — R000 Tachycardia, unspecified: Secondary | ICD-10-CM | POA: Diagnosis present

## 2017-09-19 DIAGNOSIS — Z886 Allergy status to analgesic agent status: Secondary | ICD-10-CM | POA: Diagnosis not present

## 2017-09-19 DIAGNOSIS — K219 Gastro-esophageal reflux disease without esophagitis: Secondary | ICD-10-CM | POA: Diagnosis present

## 2017-09-19 DIAGNOSIS — R748 Abnormal levels of other serum enzymes: Secondary | ICD-10-CM | POA: Diagnosis present

## 2017-09-19 DIAGNOSIS — R0602 Shortness of breath: Secondary | ICD-10-CM | POA: Diagnosis not present

## 2017-09-19 DIAGNOSIS — F17210 Nicotine dependence, cigarettes, uncomplicated: Secondary | ICD-10-CM | POA: Diagnosis present

## 2017-09-19 DIAGNOSIS — E860 Dehydration: Secondary | ICD-10-CM | POA: Diagnosis present

## 2017-09-19 DIAGNOSIS — N08 Glomerular disorders in diseases classified elsewhere: Secondary | ICD-10-CM | POA: Diagnosis present

## 2017-09-19 DIAGNOSIS — D5700 Hb-SS disease with crisis, unspecified: Secondary | ICD-10-CM | POA: Diagnosis present

## 2017-09-19 DIAGNOSIS — Z885 Allergy status to narcotic agent status: Secondary | ICD-10-CM | POA: Diagnosis not present

## 2017-09-19 DIAGNOSIS — N058 Unspecified nephritic syndrome with other morphologic changes: Secondary | ICD-10-CM | POA: Diagnosis present

## 2017-09-19 DIAGNOSIS — G930 Cerebral cysts: Secondary | ICD-10-CM | POA: Diagnosis present

## 2017-09-19 LAB — CBC WITH DIFFERENTIAL/PLATELET
BASOS PCT: 0 %
Basophils Absolute: 0 10*3/uL (ref 0.0–0.1)
EOS ABS: 0.1 10*3/uL (ref 0.0–0.7)
Eosinophils Relative: 1 %
HEMATOCRIT: 21.9 % — AB (ref 39.0–52.0)
Hemoglobin: 8 g/dL — ABNORMAL LOW (ref 13.0–17.0)
LYMPHS PCT: 39 %
Lymphs Abs: 2.7 10*3/uL (ref 0.7–4.0)
MCH: 44.4 pg — AB (ref 26.0–34.0)
MCHC: 36.5 g/dL — ABNORMAL HIGH (ref 30.0–36.0)
MCV: 121.7 fL — ABNORMAL HIGH (ref 78.0–100.0)
Monocytes Absolute: 0.3 10*3/uL (ref 0.1–1.0)
Monocytes Relative: 5 %
NEUTROS ABS: 3.7 10*3/uL (ref 1.7–7.7)
Neutrophils Relative %: 55 %
Platelets: 126 10*3/uL — ABNORMAL LOW (ref 150–400)
RBC: 1.8 MIL/uL — ABNORMAL LOW (ref 4.22–5.81)
RDW: 24 % — AB (ref 11.5–15.5)
WBC: 6.8 10*3/uL (ref 4.0–10.5)

## 2017-09-19 LAB — RETICULOCYTES
RBC.: 1.8 MIL/uL — ABNORMAL LOW (ref 4.22–5.81)
RETIC COUNT ABSOLUTE: 113.4 10*3/uL (ref 19.0–186.0)
Retic Ct Pct: 6.3 % — ABNORMAL HIGH (ref 0.4–3.1)

## 2017-09-19 LAB — COMPREHENSIVE METABOLIC PANEL
ALK PHOS: 264 U/L — AB (ref 38–126)
ALT: 125 U/L — AB (ref 0–44)
ANION GAP: 10 (ref 5–15)
AST: 138 U/L — AB (ref 15–41)
Albumin: 2.4 g/dL — ABNORMAL LOW (ref 3.5–5.0)
BUN: 27 mg/dL — ABNORMAL HIGH (ref 6–20)
CALCIUM: 8.4 mg/dL — AB (ref 8.9–10.3)
CO2: 25 mmol/L (ref 22–32)
CREATININE: 2.47 mg/dL — AB (ref 0.61–1.24)
Chloride: 104 mmol/L (ref 98–111)
GFR calc Af Amer: 38 mL/min — ABNORMAL LOW (ref >60)
GFR calc non Af Amer: 32 mL/min — ABNORMAL LOW (ref >60)
Glucose, Bld: 108 mg/dL — ABNORMAL HIGH (ref 70–99)
Potassium: 4.4 mmol/L (ref 3.5–5.1)
SODIUM: 139 mmol/L (ref 135–145)
Total Bilirubin: 2 mg/dL — ABNORMAL HIGH (ref 0.3–1.2)
Total Protein: 6.3 g/dL — ABNORMAL LOW (ref 6.5–8.1)

## 2017-09-19 LAB — I-STAT TROPONIN, ED
TROPONIN I, POC: 0.47 ng/mL — AB (ref 0.00–0.08)
Troponin i, poc: 0.35 ng/mL (ref 0.00–0.08)

## 2017-09-19 MED ORDER — DIPHENHYDRAMINE HCL 50 MG/ML IJ SOLN: 12.5000 mg | Freq: Four times a day (QID) | INTRAMUSCULAR | Status: DC | PRN

## 2017-09-19 MED ORDER — HYDROMORPHONE HCL 2 MG/ML IJ SOLN
2.0000 mg | INTRAMUSCULAR | Status: AC
  Filled 2017-09-19: qty 1

## 2017-09-19 MED ORDER — HYDROMORPHONE HCL 2 MG/ML IJ SOLN
2.0000 mg | INTRAMUSCULAR | Status: AC
  Administered 2017-09-19: 2 mg via SUBCUTANEOUS

## 2017-09-19 MED ORDER — ONDANSETRON HCL 4 MG/2ML IJ SOLN: 4.0000 mg | Freq: Four times a day (QID) | INTRAMUSCULAR | Status: DC | PRN

## 2017-09-19 MED ORDER — NALOXONE HCL 0.4 MG/ML IJ SOLN: 0.4000 mg | INTRAMUSCULAR | Status: DC | PRN

## 2017-09-19 MED ORDER — DEXTROSE-NACL 5-0.45 % IV SOLN
INTRAVENOUS | Status: DC
  Administered 2017-09-19: 20:00:00 via INTRAVENOUS

## 2017-09-19 MED ORDER — ASPIRIN 81 MG PO CHEW
324.0000 mg | CHEWABLE_TABLET | Freq: Once | ORAL | Status: AC
  Administered 2017-09-19: 324 mg via ORAL
  Filled 2017-09-19: qty 4

## 2017-09-19 MED ORDER — HYDROMORPHONE HCL 2 MG/ML IJ SOLN
2.0000 mg | INTRAMUSCULAR | Status: DC
  Filled 2017-09-19: qty 1

## 2017-09-19 MED ORDER — ONDANSETRON HCL 4 MG PO TABS: 4.0000 mg | ORAL_TABLET | Freq: Four times a day (QID) | ORAL | Status: DC | PRN

## 2017-09-19 MED ORDER — HEPARIN SODIUM (PORCINE) 5000 UNIT/ML IJ SOLN
5000.0000 [IU] | Freq: Three times a day (TID) | INTRAMUSCULAR | Status: DC
  Administered 2017-09-20 – 2017-09-22 (×5): 5000 [IU] via SUBCUTANEOUS
  Filled 2017-09-19 (×7): qty 1

## 2017-09-19 MED ORDER — ACETAMINOPHEN 650 MG RE SUPP: 650.0000 mg | Freq: Four times a day (QID) | RECTAL | Status: DC | PRN

## 2017-09-19 MED ORDER — HYDROMORPHONE HCL 2 MG/ML IJ SOLN: 2.0000 mg | INTRAMUSCULAR | Status: AC

## 2017-09-19 MED ORDER — HYDROXYUREA 500 MG PO CAPS
1500.0000 mg | ORAL_CAPSULE | Freq: Every day | ORAL | Status: DC
  Filled 2017-09-19: qty 3

## 2017-09-19 MED ORDER — HYDROMORPHONE HCL 2 MG/ML IJ SOLN
2.0000 mg | INTRAMUSCULAR | Status: AC
  Administered 2017-09-19 (×2): 2 mg via INTRAVENOUS
  Filled 2017-09-19 (×2): qty 1

## 2017-09-19 MED ORDER — POLYETHYLENE GLYCOL 3350 17 G PO PACK: 17.0000 g | PACK | Freq: Every day | ORAL | Status: DC | PRN

## 2017-09-19 MED ORDER — SODIUM CHLORIDE 0.9% FLUSH: 9.0000 mL | INTRAVENOUS | Status: DC | PRN

## 2017-09-19 MED ORDER — HYDROMORPHONE HCL 2 MG/ML IJ SOLN: 2.0000 mg | INTRAMUSCULAR | Status: DC

## 2017-09-19 MED ORDER — ACETAMINOPHEN 325 MG PO TABS: 650.0000 mg | ORAL_TABLET | Freq: Four times a day (QID) | ORAL | Status: DC | PRN

## 2017-09-19 MED ORDER — FOLIC ACID 1 MG PO TABS
1.0000 mg | ORAL_TABLET | Freq: Every day | ORAL | Status: DC
  Administered 2017-09-20 – 2017-09-21 (×2): 1 mg via ORAL
  Filled 2017-09-19 (×2): qty 1

## 2017-09-19 MED ORDER — DIPHENHYDRAMINE HCL 12.5 MG/5ML PO ELIX: 12.5000 mg | ORAL_SOLUTION | Freq: Four times a day (QID) | ORAL | Status: DC | PRN

## 2017-09-19 MED ORDER — NORTRIPTYLINE HCL 25 MG PO CAPS
25.0000 mg | ORAL_CAPSULE | Freq: Every day | ORAL | Status: DC
  Administered 2017-09-20 – 2017-09-21 (×3): 25 mg via ORAL
  Filled 2017-09-19 (×3): qty 1

## 2017-09-19 MED ORDER — DEXTROSE-NACL 5-0.45 % IV SOLN
INTRAVENOUS | Status: AC
  Administered 2017-09-19: via INTRAVENOUS

## 2017-09-19 MED ORDER — HYDROMORPHONE 1 MG/ML IV SOLN
INTRAVENOUS | Status: DC
  Administered 2017-09-20: via INTRAVENOUS
  Filled 2017-09-19: qty 25

## 2017-09-19 NOTE — ED Triage Notes (Signed)
Patient c/o chest, bilateral arm, and bilateral leg pain r/t SCC since last night.

## 2017-09-19 NOTE — H&P (Addendum)
History and Physical    [name] [name]. JYN:[id number] DOB: [birth year] DOA: 09/19/2017  PCP: Lanae Boast, FNP   Patient coming from: Home  Chief Complaint: Lower and upper extremity pain, chest pain  HPI: [name] [name]. is a 37 y.o. male with medical history significant sickle cell disease with nephropathy, immune complex glomerulonephritis with nephrotic syndrome. Patient is drowsy, status post Dilaudid and unable to give me detailed history.  He presented to the ED with complaints of lower and upper extremity pain with chest pain that started last night.  Pain did not improve when he took his home narcotics.  Patient reports pain is similar to his prior sickle cell pain. Patient tells me chest pain sometimes occurs with his typical sickle cell pain.  Chest pain has improved today, initially reported some difficulty breathing but that has since resolved.  Not able to tell me aggravating or relieving factors of the pain radiates, able to tell me associated symptoms.  Denies family history of premature coronary artery disease, denies personal or family history of blood clots in lungs or legs.  Smokes 1 to 2 cigarettes/week.   ED Course: Tachycardia to 123, O2 sats greater than 97% on room air.  Hgb stable 8.  Creatinine 2.4. Reticulocyte count consistent with prior.  Two-view chest x-ray negative for acute abnormality.  EKG unchanged from prior. i-STAT troponin-elevated 0.4 > 0.3. EDP concerned about PE, VQ scan was ordered, instead of CTA considering renal insufficiency.  Hospitalist called to admit for sickle cell pain.  Review of Systems: As per HPI other systems reviewed and negative.  Past Medical History:  Diagnosis Date  . Arachnoid Cyst of brain bilaterally    "2 really small ones in the back of my head; inside; saw them w/MRI" (09/25/2012)  . Bacterial pneumonia ~ 2012   "caught it here in the hospital" (09/25/2012)  . Chronic kidney disease    "from my sickle cell"  (09/25/2012)  . CKD (chronic kidney disease), stage II   . GERD (gastroesophageal reflux disease)    "after I eat alot of spicey foods" (09/25/2012)  . Gynecomastia, male 07/10/2012  . History of blood transfusion    "always related to sickle cell crisis" (09/25/2012)  . 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   Noted in noted from Hematology notes at [facility]  . Migraines    "take RX qd to prevent them" (09/25/2012)  . Sickle cell anemia (HCC)   . Sickle cell crisis ([place]) 09/25/2012  . Sickle cell nephropathy ([place]) 07/10/2012  . Sinus tachycardia   . Tachycardia with heart rate 121-140 beats per minute with ambulation 08/04/2016    Past Surgical History:  Procedure Laterality Date  . CHOLECYSTECTOMY  ~ 2012  . COLONOSCOPY N/A 04/23/2017   Procedure: COLONOSCOPY;  Surgeon: Irene Shipper, MD;  Location: [facility];  Service: Endoscopy;  Laterality: N/A;  . IR FLUORO GUIDE CV LINE RIGHT  12/17/2016  . IR REMOVAL TUN CV CATH W/O FL  12/21/2016  . IR US GUIDE VASC ACCESS RIGHT  12/17/2016  . spleenectomy       reports that he has been smoking cigarettes.  He has never used smokeless tobacco. He reports that he does not drink alcohol or use drugs.  Allergies  Allergen Reactions  . Nsaids   . Morphine And Related Other (See Comments)    "real bad headaches"    Family History  Problem Relation Age of Onset  . Breast cancer Mother  Prior to Admission medications   Medication Sig Start Date End Date Taking? Authorizing Provider  ergocalciferol (DRISDOL) 50000 units capsule Take 1 capsule (50,000 Units total) by mouth once a week. 05/13/17  Yes [name], FNP  folic acid (FOLVITE) 1 MG tablet Take 1 tablet (1 mg total) daily by mouth. 12/22/16  Yes Leana Gamer, MD  furosemide (LASIX) 40 MG tablet Take 40 mg by mouth daily. 08/03/17  Yes [provider]  hydroxyurea (HYDREA) 500 MG capsule TAKE 3 CAPSULES (1,500 MG TOTAL) BY MOUTH DAILY. 08/29/17  Yes  Truett Mainland, DO  nortriptyline (PAMELOR) 25 MG capsule TAKE 1 CAPSULE (25 MG TOTAL) BY MOUTH AT BEDTIME. 09/03/17  Yes Donnamae Jude, MD  oxyCODONE (OXYCONTIN) 15 mg 12 hr tablet Take 1 tablet (15 mg total) by mouth every 12 (twelve) hours. 09/14/17  Yes Lanae Boast, FNP  Oxycodone HCl 10 MG TABS Take 1 tablet (10 mg total) by mouth every 6 (six) hours as needed for up to 15 days. 09/14/17 09/29/17 Yes Lanae Boast, FNP  diltiazem (CARDIZEM) 30 MG tablet TAKE 1 TABLET (30 MG TOTAL) BY MOUTH EVERY 12 (TWELVE) HOURS. Patient not taking: Reported on 09/19/2017 06/13/17   [name], FNP  sodium bicarbonate 650 MG tablet Take 1 tablet (650 mg total) 3 (three) times daily by mouth. Patient not taking: Reported on 09/19/2017 12/21/16   Leana Gamer, MD    Physical Exam: Vitals:   09/19/17 1704 09/19/17 1832 09/19/17 1857 09/19/17 1923  BP: 111/86 110/81  110/78  Pulse: (!) 110 (!) 106  (!) 116  Resp: 18 18  19   Temp: 98.1 F (36.7 C) 98 F (36.7 C)    TempSrc: Oral Oral    SpO2: 99% 97%  98%  Weight:   57 kg (125 lb 10.6 oz)     Constitutional: Drowsy, arousable, but not fully cooperative with questions Vitals:   09/19/17 1704 09/19/17 1832 09/19/17 1857 09/19/17 1923  BP: 111/86 110/81  110/78  Pulse: (!) 110 (!) 106  (!) 116  Resp: 18 18  19   Temp: 98.1 F (36.7 C) 98 F (36.7 C)    TempSrc: Oral Oral    SpO2: 99% 97%  98%  Weight:   57 kg (125 lb 10.6 oz)    Eyes: PERRL, lids and conjunctivae normal ENMT: Mucous membranes are moist. Posterior pharynx clear of any exudate or lesions.Normal dentition.  Neck: normal, supple, no masses, no thyromegaly Respiratory: clear to auscultation bilaterally, no wheezing, no crackles. Normal respiratory effort. No accessory muscle use.  Cardiovascular: Mild tachycardia but regular rate and rhythm, no murmurs / rubs / gallops. No extremity edema. 2+ pedal pulses. Abdomen: no tenderness, no masses palpated. No hepatosplenomegaly.  Bowel sounds positive.  Musculoskeletal: no clubbing / cyanosis. No joint deformity upper and lower extremities. Good ROM, no contractures. Normal muscle tone.  Skin: no rashes, lesions, ulcers. No induration Neurologic: CN 2-12 grossly intact.  Moving all extremities spontaneously Psychiatric: Normal judgment and insight.  Drowsy. Normal mood.   Labs on Admission: I have personally reviewed following labs and imaging studies  CBC: Recent Labs  Lab 09/19/17 1619  WBC 6.8  NEUTROABS 3.7  HGB 8.0*  HCT 21.9*  MCV 121.7*  PLT 856*   Basic Metabolic Panel: Recent Labs  Lab 09/19/17 1619  NA 139  K 4.4  CL 104  CO2 25  GLUCOSE 108*  BUN 27*  CREATININE 2.47*  CALCIUM 8.4*   GFR:  Estimated Creatinine Clearance: 33.9 mL/min (A) (by C-G formula based on SCr of 2.47 mg/dL (H)). Liver Function Tests: Recent Labs  Lab 09/19/17 1619  AST 138*  ALT 125*  ALKPHOS 264*  BILITOT 2.0*  PROT 6.3*  ALBUMIN 2.4*   Anemia Panel: Recent Labs    09/19/17 1619  RETICCTPCT 6.3*   Urine analysis:    Component Value Date/Time   COLORURINE YELLOW 07/23/2017 2235   APPEARANCEUR CLEAR 07/23/2017 2235   LABSPEC 1.008 07/23/2017 2235   PHURINE 8.0 07/23/2017 2235   GLUCOSEU 150 (A) 07/23/2017 2235   HGBUR SMALL (A) 07/23/2017 2235   HGBUR small 03/10/2010 1445   BILIRUBINUR NEGATIVE 07/23/2017 2235   BILIRUBINUR neg 07/16/2017 1016   KETONESUR NEGATIVE 07/23/2017 2235   PROTEINUR >=300 (A) 07/23/2017 2235   UROBILINOGEN 0.2 07/16/2017 1016   UROBILINOGEN 0.2 05/11/2017 1335   NITRITE NEGATIVE 07/23/2017 2235   LEUKOCYTESUR NEGATIVE 07/23/2017 2235    Radiological Exams on Admission: Dg Chest 2 View  Result Date: 09/19/2017 CLINICAL DATA:  Shortness of breath.  Sickle cell crisis.  Smoker. EXAM: CHEST - 2 VIEW COMPARISON:  07/23/2017, 05/15/2017, 05/12/2017 and chest CTA dated 02/27/2017. FINDINGS: Small nodular densities are again demonstrated in both lower lung zones,  primarily due to vessels viewed on end. Otherwise, clear lungs. Mild peribronchial thickening. Cholecystectomy clips. Mild diffuse bone sclerosis. IMPRESSION: 1. No acute abnormality. 2. Stable bone sclerosis compatible with the history of sickle cell disease. Electronically Signed   By: Claudie Revering M.D.   On: 09/19/2017 18:29    EKG: Independently reviewed.  Sinus rhythm QTc 447.  T wave flattening in aVL only.  Assessment/Plan Active Problems:   TOBACCO ABUSE   Sickle cell nephropathy (HCC)   Tachycardia with heart rate 121-140 beats per minute with ambulation   Sickle cell crisis (HCC)   Immune-complex glomerulonephritis  Sickle cell pain crisis-extremity and chest pain.  -Dilaudid PCA pump -NSAIDs considering renal insufficiency -Hold home opiates -D5 half-normal saline at 75 cc/h x 12 hours  Chest pain- improving, in the setting of sickle cell pain crisis. Hx of Tobacco abuse.  No family history premature CAD, denies family personal or family history of DVT PEs..  Stat troponin 0.4>0.3.  EKG unchanged.  History of chronic sinus tachycardia. Elevated troponin in the setting of sickle cell pain crisis, and renal insuff. 325 aspirin given in ED. History of elevated troponins 02/2017 and evaluated by cardiology.  Normal echo at that time. -Trend troponin -Hold off on VQ scan at this time -EKG a.m. -Will hold off repeat Echocardiogram, consider repeating if troponin trends up significantly. - UDS -Suggest starting low-dose beta-blocker prior to discharge, to prevent tachycardia induced cardiomyopathy  Sickle cell nephropathy, immune complex glomerulonephritis-Cr- 2.4, appears to be at baseline, improved from recent hospital admission with Vanco nephrotoxicity. -BMP a.m. -Hydrate  DVT prophylaxis: Heparin Code Status Full Family Communication: None at bedside Disposition Plan: Per rounding team Consults called: None Admission status: inpt, tele   Bethena Roys MD [facility] Pager [phone] From 6PM-2AM.  Otherwise please contact night-coverage [email or web address] Password [facility]  09/19/2017, 9:33 PM

## 2017-09-19 NOTE — ED Notes (Signed)
Bed: WA17 Expected date:  Expected time:  Means of arrival:  Comments: Held

## 2017-09-19 NOTE — ED Notes (Signed)
Admit Provider at bedside. 

## 2017-09-19 NOTE — ED Provider Notes (Signed)
[facility] DEPT Provider Note   CSN: [id number] Arrival date & time: 09/19/17  1441     History   Chief Complaint Chief Complaint  Patient presents with  . Sickle Cell Pain Crisis    HPI [name] [name]. is a 37 y.o. male.  37 year old male with extensive past medical history including sickle cell anemia, immune complex glomerulonephritis resulting in CKD, GERD, tachycardia who presents with chest and extremity pain.  Yesterday evening, he began having pain in his bilateral arms and legs as well as intermittent left-sided chest pain that he describes as a pressure or dull pain.  He denies any sharp or pleuritic pain.  He has had this pain twice previously with pain crises.  He notes that he has had some shortness of breath.  He denies any cough or fevers.  No vomiting, diarrhea, abdominal pain, or rashes.  He tried taking his home oxycodone, last dose was 2 hours prior to arrival, with no relief.  The history is provided by the patient.  Sickle Cell Pain Crisis    Past Medical History:  Diagnosis Date  . Arachnoid Cyst of brain bilaterally    "2 really small ones in the back of my head; inside; saw them w/MRI" (09/25/2012)  . Bacterial pneumonia ~ 2012   "caught it here in the hospital" (09/25/2012)  . Chronic kidney disease    "from my sickle cell" (09/25/2012)  . CKD (chronic kidney disease), stage II   . GERD (gastroesophageal reflux disease)    "after I eat alot of spicey foods" (09/25/2012)  . Gynecomastia, male 07/10/2012  . History of blood transfusion    "always related to sickle cell crisis" (09/25/2012)  . 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   Noted in noted from Hematology notes at [facility]  . Migraines    "take RX qd to prevent them" (09/25/2012)  . Sickle cell anemia (HCC)   . Sickle cell crisis ([place]) 09/25/2012  . Sickle cell nephropathy ([place]) 07/10/2012  . Sinus tachycardia   . Tachycardia with heart rate  121-140 beats per minute with ambulation 08/04/2016    Patient Active Problem List   Diagnosis Date Noted  . Chronic pain syndrome 08/15/2017  . GERD (gastroesophageal reflux disease) 07/25/2017  . Vitamin D deficiency 05/13/2017  . Sickle cell anemia with crisis ([place]) 05/12/2017  . LLQ abdominal pain   . Nephrotic syndrome   . Abnormal CT of the abdomen   . Immune-complex glomerulonephritis   . Other ascites   . RUQ pain   . Nausea vomiting and diarrhea 04/20/2017  . Colitis 04/07/2017  . Abnormal liver function   . HCAP (healthcare-associated pneumonia)   . QT prolongation   . Pneumonia 02/27/2017  . Elevated troponin 02/27/2017  . Diarrhea 02/27/2017  . Soft tissue swelling of chest wall 12/18/2016  . Hypoxia   . Acute kidney injury superimposed on chronic kidney disease ([place]) 12/13/2016  . Vasoocclusive sickle cell crisis ([place]) 12/13/2016  . Sickle cell crisis ([place]) 10/14/2016  . Hyponatremia 10/14/2016  . Tachycardia with heart rate 121-140 beats per minute with ambulation 08/04/2016  . Metabolic acidosis 33/35/4562  . Leucocytosis 08/02/2016  . Anemia 08/02/2016  . Macrocytosis due to Hydroxyurea 08/02/2016  . Acute renal failure superimposed on chronic kidney disease ([place])   . AKI (acute kidney injury) ([place])   . Sickle cell anemia with pain ([place]) 03/19/2014  . Sickle cell pain crisis ([place]) 01/24/2013  . Chronic, continuous use of opioids 08/30/2012  .  Chronic headaches 07/10/2012  . Gynecomastia, male 07/10/2012  . Sickle cell nephropathy ([place]) 07/10/2012  . Tachycardia 12/08/2011  . Systolic murmur 25/95/6387  . SICKLE CELL CRISIS 01/04/2010  . Migraine 11/26/2009  . Chronic kidney disease 03/06/2009  . Sickle cell disease, type SS.  06/18/2008  . TOBACCO ABUSE 05/22/2007    Past Surgical History:  Procedure Laterality Date  . CHOLECYSTECTOMY  ~ 2012  . COLONOSCOPY N/A 04/23/2017   Procedure: COLONOSCOPY;  Surgeon: Irene Shipper, MD;  Location: [facility];  Service: Endoscopy;  Laterality: N/A;  . IR FLUORO GUIDE CV LINE RIGHT  12/17/2016  . IR REMOVAL TUN CV CATH W/O FL  12/21/2016  . IR US GUIDE VASC ACCESS RIGHT  12/17/2016  . spleenectomy          Home Medications    Prior to Admission medications   Medication Sig Start Date End Date Taking? Authorizing Provider  ergocalciferol (DRISDOL) 50000 units capsule Take 1 capsule (50,000 Units total) by mouth once a week. 05/13/17  Yes [name], FNP  folic acid (FOLVITE) 1 MG tablet Take 1 tablet (1 mg total) daily by mouth. 12/22/16  Yes Leana Gamer, MD  furosemide (LASIX) 40 MG tablet Take 40 mg by mouth daily. 08/03/17  Yes [provider]  hydroxyurea (HYDREA) 500 MG capsule TAKE 3 CAPSULES (1,500 MG TOTAL) BY MOUTH DAILY. 08/29/17  Yes Truett Mainland, DO  nortriptyline (PAMELOR) 25 MG capsule TAKE 1 CAPSULE (25 MG TOTAL) BY MOUTH AT BEDTIME. 09/03/17  Yes Donnamae Jude, MD  oxyCODONE (OXYCONTIN) 15 mg 12 hr tablet Take 1 tablet (15 mg total) by mouth every 12 (twelve) hours. 09/14/17  Yes Lanae Boast, FNP  Oxycodone HCl 10 MG TABS Take 1 tablet (10 mg total) by mouth every 6 (six) hours as needed for up to 15 days. 09/14/17 09/29/17 Yes Lanae Boast, FNP  diltiazem (CARDIZEM) 30 MG tablet TAKE 1 TABLET (30 MG TOTAL) BY MOUTH EVERY 12 (TWELVE) HOURS. Patient not taking: Reported on 09/19/2017 06/13/17   [name], FNP  sodium bicarbonate 650 MG tablet Take 1 tablet (650 mg total) 3 (three) times daily by mouth. Patient not taking: Reported on 09/19/2017 12/21/16   Leana Gamer, MD    Family History Family History  Problem Relation Age of Onset  . Breast cancer Mother     Social History Social History   Tobacco Use  . Smoking status: Current Some Day Smoker    Types: Cigarettes  . Smokeless tobacco: Never Used  . Tobacco comment: 09/25/2012 "I don't buy cigarettes; bum one from friends q now and then"  Substance Use Topics  . Alcohol  use: No    Alcohol/week: 0.0 oz    Frequency: Never  . Drug use: No    Types: Marijuana    Comment: 09/25/2012 "weed was my biggest problem; stopped that ~ 1 1/2 months ago; I don't do cocaine; can't say I never have though"     Allergies   Nsaids and Morphine and related   Review of Systems Review of Systems All other systems reviewed and are negative except that which was mentioned in HPI   Physical Exam Updated Vital Signs BP 110/78   Pulse (!) 116   Temp 98 F (36.7 C) (Oral)   Resp 19   Wt 57 kg (125 lb 10.6 oz)   SpO2 98%   BMI 22.26 kg/m   Physical Exam  Constitutional: He is oriented to  person, place, and time. He appears well-developed and well-nourished. No distress.  HENT:  Head: Normocephalic and atraumatic.  Moist mucous membranes  Eyes: Conjunctivae are normal. Scleral icterus is present.  Neck: Neck supple.  Cardiovascular: Regular rhythm. Tachycardia present.  Murmur heard. Pulmonary/Chest: Effort normal and breath sounds normal.  Abdominal: Soft. Bowel sounds are normal. He exhibits no distension. There is no tenderness.  Musculoskeletal: He exhibits no edema or tenderness.  Neurological: He is alert and oriented to person, place, and time.  Fluent speech Normal gait  Skin: Skin is warm and dry. No rash noted.  Psychiatric: He has a normal mood and affect. Judgment normal.  Nursing note and vitals reviewed.    ED Treatments / Results  Labs (all labs ordered are listed, but only abnormal results are displayed) Labs Reviewed  COMPREHENSIVE METABOLIC PANEL - Abnormal; Notable for the following components:      Result Value   Glucose, Bld 108 (*)    BUN 27 (*)    Creatinine, Ser 2.47 (*)    Calcium 8.4 (*)    Total Protein 6.3 (*)    Albumin 2.4 (*)    AST 138 (*)    ALT 125 (*)    Alkaline Phosphatase 264 (*)    Total Bilirubin 2.0 (*)    GFR calc non Af Amer 32 (*)    GFR calc Af Amer 38 (*)    All other components within normal  limits  CBC WITH DIFFERENTIAL/PLATELET - Abnormal; Notable for the following components:   RBC 1.80 (*)    Hemoglobin 8.0 (*)    HCT 21.9 (*)    MCV 121.7 (*)    MCH 44.4 (*)    MCHC 36.5 (*)    RDW 24.0 (*)    Platelets 126 (*)    All other components within normal limits  RETICULOCYTES - Abnormal; Notable for the following components:   Retic Ct Pct 6.3 (*)    RBC. 1.80 (*)    All other components within normal limits  I-STAT TROPONIN, ED - Abnormal; Notable for the following components:   Troponin i, poc 0.47 (*)    All other components within normal limits  I-STAT TROPONIN, ED - Abnormal; Notable for the following components:   Troponin i, poc 0.35 (*)    All other components within normal limits    EKG EKG Interpretation  Date/Time:  Wednesday September 19 2017 15:11:54 EDT Ventricular Rate:  118 PR Interval:    QRS Duration: 73 QT Interval:  304 QTC Calculation: 426 R Axis:   74 Text Interpretation:  Sinus tachycardia tachycardia is similar to previous Confirmed by Theotis Burrow [phone]) on 09/19/2017 4:45:00 PM   Radiology Dg Chest 2 View  Result Date: 09/19/2017 CLINICAL DATA:  Shortness of breath.  Sickle cell crisis.  Smoker. EXAM: CHEST - 2 VIEW COMPARISON:  07/23/2017, 05/15/2017, 05/12/2017 and chest CTA dated 02/27/2017. FINDINGS: Small nodular densities are again demonstrated in both lower lung zones, primarily due to vessels viewed on end. Otherwise, clear lungs. Mild peribronchial thickening. Cholecystectomy clips. Mild diffuse bone sclerosis. IMPRESSION: 1. No acute abnormality. 2. Stable bone sclerosis compatible with the history of sickle cell disease. Electronically Signed   By: Claudie Revering M.D.   On: 09/19/2017 18:29    Procedures .Critical Care Performed by: Sharlett Iles, MD Authorized by: Sharlett Iles, MD   Critical care provider statement:    Critical care time (minutes):  30   Critical care time was  exclusive of:  Separately  billable procedures and treating other patients   Critical care was necessary to treat or prevent imminent or life-threatening deterioration of the following conditions:  Cardiac failure   Critical care was time spent personally by me on the following activities:  Development of treatment plan with patient or surrogate, evaluation of patient's response to treatment, examination of patient, obtaining history from patient or surrogate, ordering and performing treatments and interventions, ordering and review of laboratory studies, ordering and review of radiographic studies, re-evaluation of patient's condition and review of old charts   (including critical care time)  Medications Ordered in ED Medications  dextrose 5 %-0.45 % sodium chloride infusion ( Intravenous New Bag/Given 09/19/17 2001)  HYDROmorphone (DILAUDID) injection 2 mg ( Intravenous Canceled Entry 09/19/17 1921)    Or  HYDROmorphone (DILAUDID) injection 2 mg ( Subcutaneous See Alternative 09/19/17 1921)  aspirin chewable tablet 324 mg (324 mg Oral Given 09/19/17 1808)  HYDROmorphone (DILAUDID) injection 2 mg ( Intravenous See Alternative 09/19/17 1808)    Or  HYDROmorphone (DILAUDID) injection 2 mg (2 mg Subcutaneous Given 09/19/17 1808)  HYDROmorphone (DILAUDID) injection 2 mg ( Intravenous See Alternative 09/19/17 1844)    Or  HYDROmorphone (DILAUDID) injection 2 mg (2 mg Subcutaneous Given 09/19/17 1844)  HYDROmorphone (DILAUDID) injection 2 mg (2 mg Intravenous Given 09/19/17 2110)    Or  HYDROmorphone (DILAUDID) injection 2 mg ( Subcutaneous See Alternative 09/19/17 2110)     Initial Impression / Assessment and Plan / ED Course  I have reviewed the triage vital signs and the nursing notes.  Pertinent labs & imaging results that were available during my care of the patient were reviewed by me and considered in my medical decision making (see chart for details).     Pt well-appearing on exam, afebrile, heart rate in the 110s.  He does  have a history of tachycardia and states he was supposed to see a cardiologist regarding irregular heartbeat but missed the appointment recently.  EKG without acute ischemic changes.  Labs show troponin of 0.47, creatinine 2.47 which is slightly improved from previous hospitalization, mild elevation of LFTs with AST 138, ALT 125, total bilirubin 2. Hgb 8.   Chest x-ray negative acute.  I am unable to get a CTA chest to rule out PE because his kidney function is poor. I have ordered a V/Q scan.  I feel it would be unlikely that a PE would cause his elevated troponin if he is not hypoxic or short of breath.   Patient is comfortable after pain medicines on reassessment.  I discussed admission with [facility] hospitalist, Dr. Denton Brick, and pt admitted for further w/u.   Final Clinical Impressions(s) / ED Diagnoses   Final diagnoses:  Elevated troponin  Chest pain, unspecified type  Vasoocclusive sickle cell crisis [facility])    ED Discharge Orders    None       Jlyn Bracamonte, Wenda Overland, MD 09/19/17 2115

## 2017-09-20 ENCOUNTER — Other Ambulatory Visit: Payer: Self-pay

## 2017-09-20 DIAGNOSIS — R748 Abnormal levels of other serum enzymes: Secondary | ICD-10-CM

## 2017-09-20 DIAGNOSIS — N058 Unspecified nephritic syndrome with other morphologic changes: Secondary | ICD-10-CM

## 2017-09-20 DIAGNOSIS — N08 Glomerular disorders in diseases classified elsewhere: Secondary | ICD-10-CM

## 2017-09-20 DIAGNOSIS — R079 Chest pain, unspecified: Secondary | ICD-10-CM

## 2017-09-20 DIAGNOSIS — D5700 Hb-SS disease with crisis, unspecified: Principal | ICD-10-CM

## 2017-09-20 LAB — RAPID URINE DRUG SCREEN, HOSP PERFORMED
AMPHETAMINES: NOT DETECTED
BARBITURATES: NOT DETECTED
Benzodiazepines: NOT DETECTED
Cocaine: POSITIVE — AB
OPIATES: POSITIVE — AB
TETRAHYDROCANNABINOL: NOT DETECTED

## 2017-09-20 LAB — CBC
HEMATOCRIT: 16.4 % — AB (ref 39.0–52.0)
HEMOGLOBIN: 5.9 g/dL — AB (ref 13.0–17.0)
MCH: 43.4 pg — ABNORMAL HIGH (ref 26.0–34.0)
MCHC: 36 g/dL (ref 30.0–36.0)
MCV: 120.6 fL — ABNORMAL HIGH (ref 78.0–100.0)
Platelets: 116 10*3/uL — ABNORMAL LOW (ref 150–400)
RBC: 1.36 MIL/uL — ABNORMAL LOW (ref 4.22–5.81)
RDW: 23.9 % — AB (ref 11.5–15.5)
WBC: 7.7 10*3/uL (ref 4.0–10.5)

## 2017-09-20 LAB — BASIC METABOLIC PANEL
ANION GAP: 7 (ref 5–15)
BUN: 27 mg/dL — AB (ref 6–20)
CALCIUM: 8.3 mg/dL — AB (ref 8.9–10.3)
CO2: 25 mmol/L (ref 22–32)
Chloride: 104 mmol/L (ref 98–111)
Creatinine, Ser: 2.34 mg/dL — ABNORMAL HIGH (ref 0.61–1.24)
GFR calc Af Amer: 40 mL/min — ABNORMAL LOW (ref >60)
GFR, EST NON AFRICAN AMERICAN: 35 mL/min — AB (ref >60)
Glucose, Bld: 120 mg/dL — ABNORMAL HIGH (ref 70–99)
POTASSIUM: 3.6 mmol/L (ref 3.5–5.1)
Sodium: 136 mmol/L (ref 135–145)

## 2017-09-20 LAB — TROPONIN I
TROPONIN I: 0.47 ng/mL — AB (ref <0.03)
TROPONIN I: 0.46 ng/mL — AB (ref <0.03)
Troponin I: 0.46 ng/mL (ref <0.03)

## 2017-09-20 LAB — PREPARE RBC (CROSSMATCH)

## 2017-09-20 MED ORDER — SODIUM CHLORIDE 0.9% IV SOLUTION
Freq: Once | INTRAVENOUS | Status: AC
  Administered 2017-09-20: 11:00:00 via INTRAVENOUS

## 2017-09-20 MED ORDER — DIPHENHYDRAMINE HCL 25 MG PO CAPS
25.0000 mg | ORAL_CAPSULE | Freq: Four times a day (QID) | ORAL | Status: DC | PRN
  Administered 2017-09-20: 25 mg via ORAL
  Filled 2017-09-20: qty 1

## 2017-09-20 MED ORDER — HYDROMORPHONE HCL 1 MG/ML IJ SOLN
0.5000 mg | Freq: Once | INTRAMUSCULAR | Status: AC
  Administered 2017-09-20: 0.5 mg via INTRAVENOUS
  Filled 2017-09-20: qty 0.5

## 2017-09-20 MED ORDER — HYDROMORPHONE 1 MG/ML IV SOLN
INTRAVENOUS | Status: DC
  Administered 2017-09-20: 3.3 mg via INTRAVENOUS
  Administered 2017-09-20: 1.5 mg via INTRAVENOUS
  Administered 2017-09-21: 0.3 mg via INTRAVENOUS
  Administered 2017-09-21: 1.5 mg via INTRAVENOUS

## 2017-09-20 NOTE — Progress Notes (Signed)
Critical lab value: Hgb = 5.9 Notified MD [name] at [phone] Waiting for order.

## 2017-09-20 NOTE — Progress Notes (Signed)
Patient arrived to unit via stretcher to room 1520. VS taken. Alert and oriented x3, callbell within reach.  Gait steady, general weakness. Pt guide at the bedside. Initial assessment completed. Will continue to monitor

## 2017-09-20 NOTE — Progress Notes (Signed)
Pt continues to have difficulty  With keeping sensor in place for  ETCO2 / respiration status. This RN repeatedly reminding pt of keeping sensor in place and that PCA pump will be turned off if cannot oblige.   PCA pump turned off at 0115. Pt sleeping soundly, no indication of distress or pain. Fluids running. Will continue to monitor and assess pt's needs.

## 2017-09-20 NOTE — Progress Notes (Signed)
PHARMACY BRIEF NOTE: HYDROXYUREA   By [facility] policy, hydroxyurea is automatically held when any of the following laboratory values occur:  ANC < 2 K  Pltc < 80K in sickle-cell patients; < 100K in other patients  Hgb <= 6 in sickle-cell patients (hgb 5.9); < 8 in other patients  Reticulocytes < 80K when Hgb < 9  Hydroxyurea has been held (discontinued from profile) per policy.    Dia Sitter, PharmD, BCPS 09/20/2017 10:31 AM

## 2017-09-20 NOTE — Progress Notes (Signed)
Pt  Woke up req pain medication. Pt educated on PCA pump. Pt more lucid at this time and understands the need for the sensors. PCA restarted.

## 2017-09-20 NOTE — Progress Notes (Signed)
Patient ID: [name] Pick., male   DOB: [birth year], 37 y.o.   MRN: [id number] Subjective:  [name] Blyther. is a 37 y.o. male with medical history significant sickle cell disease with nephropathy, immune complex glomerulonephritis with nephrotic syndrome, and CKD stage III who was admitted yesterday for sickle cell pain crisis and acute of CKD. His Hb has dropped to 5.9 today from 8.0 yesterday (likely due to cellular dehydration revealing the real Hb after hydration). Patient has no new complaint except for pain which he says is not responding to his current PCA setting. He denies any nausea or vomiting or diarrhea. He denies SOB, chest pain has resolved.  Objective:  Vital signs in last 24 hours:  Vitals:   09/20/17 1110 09/20/17 1133 09/20/17 1354 09/20/17 1538  BP:  101/67 106/75   Pulse:  88 81   Resp: 17 13 17 17   Temp:  98.6 F (37 C) 98.4 F (36.9 C)   TempSrc:  Oral Oral   SpO2: 100% 100% 100% 100%  Weight:      Height:        Intake/Output from previous day:   Intake/Output Summary (Last 24 hours) at 09/20/2017 1757 Last data filed at 09/20/2017 1700 Gross per 24 hour  Intake 999.55 ml  Output 1150 ml  Net -150.45 ml    Physical Exam: General: Alert, awake, oriented x3, in no acute distress.  HEENT: [place]/AT PEERL, EOMI Neck: Trachea midline,  no masses, no thyromegal,y no JVD, no carotid bruit OROPHARYNX:  Moist, No exudate/ erythema/lesions.  Heart: Regular rate and rhythm, without murmurs, rubs, gallops, PMI non-displaced, no heaves or thrills on palpation.  Lungs: Clear to auscultation, no wheezing or rhonchi noted. No increased vocal fremitus resonant to percussion  Abdomen: Soft, nontender, nondistended, positive bowel sounds, no masses no hepatosplenomegaly noted..  Neuro: No focal neurological deficits noted cranial nerves II through XII grossly intact. DTRs 2+ bilaterally upper and lower extremities. Strength 5 out of 5 in bilateral upper and lower  extremities. Musculoskeletal: No warm swelling or erythema around joints, no spinal tenderness noted. Psychiatric: Patient alert and oriented x3, good insight and cognition, good recent to remote recall. Lymph node survey: No cervical axillary or inguinal lymphadenopathy noted.  Lab Results:  Basic Metabolic Panel:    Component Value Date/Time   NA 136 09/20/2017 0530   NA 138 06/13/2017 1102   K 3.6 09/20/2017 0530   CL 104 09/20/2017 0530   CO2 25 09/20/2017 0530   BUN 27 (H) 09/20/2017 0530   BUN 18 06/13/2017 1102   CREATININE 2.34 (H) 09/20/2017 0530   CREATININE 1.45 (H) 05/12/2014 1430   GLUCOSE 120 (H) 09/20/2017 0530   CALCIUM 8.3 (L) 09/20/2017 0530   CBC:    Component Value Date/Time   WBC 7.7 09/20/2017 0530   HGB 5.9 (LL) 09/20/2017 0530   HGB 7.3 (L) 06/13/2017 1102   HCT 16.4 (L) 09/20/2017 0530   HCT 22.5 (L) 06/13/2017 1102   PLT 116 (L) 09/20/2017 0530   PLT 455 (H) 06/13/2017 1102   MCV 120.6 (H) 09/20/2017 0530   MCV 96 06/13/2017 1102   NEUTROABS 3.7 09/19/2017 1619   NEUTROABS 4.9 06/13/2017 1102   LYMPHSABS 2.7 09/19/2017 1619   LYMPHSABS 4.4 (H) 06/13/2017 1102   MONOABS 0.3 09/19/2017 1619   EOSABS 0.1 09/19/2017 1619   EOSABS 0.2 06/13/2017 1102   BASOSABS 0.0 09/19/2017 1619   BASOSABS 0.0 06/13/2017 1102    No results found  for this or any previous visit (from the past 240 hour(s)).  Studies/Results: Dg Chest 2 View  Result Date: 09/19/2017 CLINICAL DATA:  Shortness of breath.  Sickle cell crisis.  Smoker. EXAM: CHEST - 2 VIEW COMPARISON:  07/23/2017, 05/15/2017, 05/12/2017 and chest CTA dated 02/27/2017. FINDINGS: Small nodular densities are again demonstrated in both lower lung zones, primarily due to vessels viewed on end. Otherwise, clear lungs. Mild peribronchial thickening. Cholecystectomy clips. Mild diffuse bone sclerosis. IMPRESSION: 1. No acute abnormality. 2. Stable bone sclerosis compatible with the history of sickle cell  disease. Electronically Signed   By: Beckie Salts M.D.   On: 09/19/2017 18:29    Medications: Scheduled Meds: . folic acid  1 mg Oral Daily  . heparin  5,000 Units Subcutaneous Q8H  . HYDROmorphone   Intravenous Q4H  . nortriptyline  25 mg Oral QHS   Continuous Infusions: PRN Meds:.acetaminophen **OR** acetaminophen, diphenhydrAMINE, naloxone **AND** sodium chloride flush, ondansetron **OR** [DISCONTINUED] ondansetron (ZOFRAN) IV, polyethylene glycol  Assessment/Plan: Active Problems:   TOBACCO ABUSE   Sickle cell nephropathy (HCC)   Tachycardia with heart rate 121-140 beats per minute with ambulation   Sickle cell crisis (HCC)   Immune-complex glomerulonephritis  1. Hb Sickle Cell Disease with crisis: Continue IVF D5 .45% Saline @ 125 mls/hour, adjust weight based Dilaudid PCA for better pain control, add home pain medications IV Toradol is contraindicated due to CKD, Monitor vitals very closely, Re-evaluate pain scale regularly, 2 L of Oxygen by [place]. 2. Sickle Cell Anemia: Hb is down to 5.9, will transfuse one unit of PRBC today, repeat labs in am.  3. Acute on Chronic Kidney Disease: Most likely due to dehydration, will rehydrate, no NSAID 4. Chronic pain Syndrome: Restart home pain medication  5. Chest Pain: Resolved  Code Status: Full Code Family Communication: N/A Disposition Plan: Not yet ready for discharge  [name] [name]  If 7PM-7AM, please contact night-coverage.  09/20/2017, 5:57 PM  LOS: 1 day

## 2017-09-21 DIAGNOSIS — F17200 Nicotine dependence, unspecified, uncomplicated: Secondary | ICD-10-CM

## 2017-09-21 LAB — BPAM RBC
Blood Product Expiration Date: 201908212359
ISSUE DATE / TIME: 201908081112
Unit Type and Rh: 5100

## 2017-09-21 LAB — CBC WITH DIFFERENTIAL/PLATELET
BASOS ABS: 0 10*3/uL (ref 0.0–0.1)
BASOS PCT: 0 %
EOS ABS: 0.1 10*3/uL (ref 0.0–0.7)
Eosinophils Relative: 1 %
HEMATOCRIT: 25.5 % — AB (ref 39.0–52.0)
HEMOGLOBIN: 9.3 g/dL — AB (ref 13.0–17.0)
Lymphocytes Relative: 46 %
Lymphs Abs: 2.9 10*3/uL (ref 0.7–4.0)
MCH: 40.6 pg — AB (ref 26.0–34.0)
MCHC: 36.5 g/dL — ABNORMAL HIGH (ref 30.0–36.0)
MCV: 111.4 fL — ABNORMAL HIGH (ref 78.0–100.0)
MONOS PCT: 2 %
Monocytes Absolute: 0.2 10*3/uL (ref 0.1–1.0)
NEUTROS ABS: 3.1 10*3/uL (ref 1.7–7.7)
NEUTROS PCT: 51 %
Platelets: 146 10*3/uL — ABNORMAL LOW (ref 150–400)
RBC: 2.29 MIL/uL — ABNORMAL LOW (ref 4.22–5.81)
WBC: 6.2 10*3/uL (ref 4.0–10.5)
nRBC: 3 /100 WBC — ABNORMAL HIGH

## 2017-09-21 LAB — TYPE AND SCREEN
ABO/RH(D): O POS
Antibody Screen: NEGATIVE
DONOR AG TYPE: NEGATIVE
Unit division: 0

## 2017-09-21 LAB — BASIC METABOLIC PANEL
Anion gap: 6 (ref 5–15)
BUN: 20 mg/dL (ref 6–20)
CHLORIDE: 110 mmol/L (ref 98–111)
CO2: 22 mmol/L (ref 22–32)
Calcium: 8.2 mg/dL — ABNORMAL LOW (ref 8.9–10.3)
Creatinine, Ser: 1.82 mg/dL — ABNORMAL HIGH (ref 0.61–1.24)
GFR calc non Af Amer: 47 mL/min — ABNORMAL LOW (ref >60)
GFR, EST AFRICAN AMERICAN: 54 mL/min — AB (ref >60)
Glucose, Bld: 135 mg/dL — ABNORMAL HIGH (ref 70–99)
POTASSIUM: 4.1 mmol/L (ref 3.5–5.1)
Sodium: 138 mmol/L (ref 135–145)

## 2017-09-21 LAB — MAGNESIUM: Magnesium: 2.2 mg/dL (ref 1.7–2.4)

## 2017-09-21 LAB — PHOSPHORUS: PHOSPHORUS: 3.8 mg/dL (ref 2.5–4.6)

## 2017-09-21 MED ORDER — HYDROMORPHONE 1 MG/ML IV SOLN
INTRAVENOUS | Status: DC
  Administered 2017-09-21: 3 mg via INTRAVENOUS
  Administered 2017-09-21: 3.5 mg via INTRAVENOUS
  Administered 2017-09-21: 3.3 mg via INTRAVENOUS
  Administered 2017-09-21: 20:00:00 via INTRAVENOUS
  Administered 2017-09-22: 4.5 mg via INTRAVENOUS
  Administered 2017-09-22: 3.5 mg via INTRAVENOUS
  Administered 2017-09-22: 5.5 mg via INTRAVENOUS
  Filled 2017-09-21: qty 25

## 2017-09-21 MED ORDER — HYDROMORPHONE HCL 1 MG/ML IJ SOLN
1.0000 mg | Freq: Once | INTRAMUSCULAR | Status: AC
  Administered 2017-09-21: 1 mg via SUBCUTANEOUS
  Filled 2017-09-21: qty 1

## 2017-09-21 NOTE — Progress Notes (Signed)
Pt c/o pain . Pt is not happy about his PCA dose of dilaudid ,pt states that little dose is not helping my pain and I have to wait 20 minutes to get next dose. Paged oncall. Received one time dose of dilaudid IV push. Administered 0.5mg  x1 dose of dilaudid . Pt looks more comfortable and relax. Will continue to monitor.

## 2017-09-21 NOTE — Progress Notes (Signed)
Patient ID: [name] Pick., male   DOB: Dec 13, 1980, 37 y.o.   MRN: [id number] Subjective:  Patient doing much better today, no new complaint. No fever, no chest pain, no SOB. He did not use his PCA pump as much yesterday but after education and counseling, he started to use it appropriately and pain is now tolerable. Ambulating and eating well.  Objective:  Vital signs in last 24 hours:  Vitals:   09/21/17 1041 09/21/17 1209 09/21/17 1450 09/21/17 1556  BP: 110/62  109/63   Pulse: 72  73   Resp: 13 11 17 10   Temp: 98.5 F (36.9 C)  98.2 F (36.8 C)   TempSrc: Oral  Oral   SpO2: 100% 99% 100% 100%  Weight:      Height:        Intake/Output from previous day:   Intake/Output Summary (Last 24 hours) at 09/21/2017 1649 Last data filed at 09/21/2017 1451 Gross per 24 hour  Intake 990 ml  Output 1700 ml  Net -710 ml    Physical Exam: General: Alert, awake, oriented x3, in no acute distress.  HEENT: [place]/AT PEERL, EOMI Neck: Trachea midline,  no masses, no thyromegal,y no JVD, no carotid bruit OROPHARYNX:  Moist, No exudate/ erythema/lesions.  Heart: Regular rate and rhythm, without murmurs, rubs, gallops, PMI non-displaced, no heaves or thrills on palpation.  Lungs: Clear to auscultation, no wheezing or rhonchi noted. No increased vocal fremitus resonant to percussion  Abdomen: Soft, nontender, nondistended, positive bowel sounds, no masses no hepatosplenomegaly noted..  Neuro: No focal neurological deficits noted cranial nerves II through XII grossly intact. DTRs 2+ bilaterally upper and lower extremities. Strength 5 out of 5 in bilateral upper and lower extremities. Musculoskeletal: No warm swelling or erythema around joints, no spinal tenderness noted. Psychiatric: Patient alert and oriented x3, good insight and cognition, good recent to remote recall. Lymph node survey: No cervical axillary or inguinal lymphadenopathy noted.  Lab Results:  Basic Metabolic Panel:     Component Value Date/Time   NA 138 09/21/2017 1020   NA 138 06/13/2017 1102   K 4.1 09/21/2017 1020   CL 110 09/21/2017 1020   CO2 22 09/21/2017 1020   BUN 20 09/21/2017 1020   BUN 18 06/13/2017 1102   CREATININE 1.82 (H) 09/21/2017 1020   CREATININE 1.45 (H) 05/12/2014 1430   GLUCOSE 135 (H) 09/21/2017 1020   CALCIUM 8.2 (L) 09/21/2017 1020   CBC:    Component Value Date/Time   WBC 6.2 09/21/2017 1020   HGB 9.3 (L) 09/21/2017 1020   HGB 7.3 (L) 06/13/2017 1102   HCT 25.5 (L) 09/21/2017 1020   HCT 22.5 (L) 06/13/2017 1102   PLT 146 (L) 09/21/2017 1020   PLT 455 (H) 06/13/2017 1102   MCV 111.4 (H) 09/21/2017 1020   MCV 96 06/13/2017 1102   NEUTROABS 3.1 09/21/2017 1020   NEUTROABS 4.9 06/13/2017 1102   LYMPHSABS 2.9 09/21/2017 1020   LYMPHSABS 4.4 (H) 06/13/2017 1102   MONOABS 0.2 09/21/2017 1020   EOSABS 0.1 09/21/2017 1020   EOSABS 0.2 06/13/2017 1102   BASOSABS 0.0 09/21/2017 1020   BASOSABS 0.0 06/13/2017 1102    No results found for this or any previous visit (from the past 240 hour(s)).  Studies/Results: Dg Chest 2 View  Result Date: 09/19/2017 CLINICAL DATA:  Shortness of breath.  Sickle cell crisis.  Smoker. EXAM: CHEST - 2 VIEW COMPARISON:  07/23/2017, 05/15/2017, 05/12/2017 and chest CTA dated 02/27/2017. FINDINGS: Small nodular densities  are again demonstrated in both lower lung zones, primarily due to vessels viewed on end. Otherwise, clear lungs. Mild peribronchial thickening. Cholecystectomy clips. Mild diffuse bone sclerosis. IMPRESSION: 1. No acute abnormality. 2. Stable bone sclerosis compatible with the history of sickle cell disease. Electronically Signed   By: Beckie Salts M.D.   On: 09/19/2017 18:29    Medications: Scheduled Meds: . folic acid  1 mg Oral Daily  . heparin  5,000 Units Subcutaneous Q8H  . HYDROmorphone   Intravenous Q4H  . nortriptyline  25 mg Oral QHS   Continuous Infusions: PRN Meds:.acetaminophen **OR** acetaminophen,  diphenhydrAMINE, naloxone **AND** sodium chloride flush, ondansetron **OR** [DISCONTINUED] ondansetron (ZOFRAN) IV, polyethylene glycol  Assessment/Plan: Active Problems:   TOBACCO ABUSE   Chest pain   Sickle cell nephropathy (HCC)   Tachycardia with heart rate 121-140 beats per minute with ambulation   Sickle cell crisis (HCC)   Immune-complex glomerulonephritis  1. Hb Sickle Cell Disease with crisis: Continue IVF, continue weight based Dilaudid PCA for better pain control, continue home pain medications, IV Toradol is contraindicated due to CKD, Monitor vitals very closely, Re-evaluate pain scale regularly, 2 L of Oxygen by [place]. 2. Sickle Cell Anemia: Hb is up to 9.1 s/p transfusion of one unit of PRBC today, repeat labs in am.  3. Acute on Chronic Kidney Disease: Most likely due to dehydration, improved after rehydration, continue IVF, no NSAID 4. Elevated Liver Enzymes: Improving, no icterus. Continue to monitor 5. Chronic pain Syndrome: Continue home pain medication   6. Chest Pain: Resolved    Code Status: Full Code Family Communication: N/A Disposition Plan: Not yet ready for discharge  [name] [name]  If 7PM-7AM, please contact night-coverage.  09/21/2017, 4:49 PM  LOS: 2 days

## 2017-09-21 NOTE — Care Management Note (Signed)
Case Management Note  Patient Details  Name: [name] [name]. MRN: [id number] Date of Birth: [birth year]  Subjective/Objective:        Sickle cell crisis-iv dilaudid pca pump for pain            Action/Plan: Will follow for cm needs  Expected Discharge Date:                  Expected Discharge Plan:  Home/Self Care  In-House Referral:     Discharge planning Services  CM Consult  Post Acute Care Choice:    Choice offered to:     DME Arranged:    DME Agency:     HH Arranged:    HH Agency:     Status of Service:  In process, will continue to follow  If discussed at Long Length of Stay Meetings, dates discussed:    Additional Comments:  Leeroy Cha, RN 09/21/2017, 12:45 PM

## 2017-09-21 NOTE — Progress Notes (Signed)
PHARMACY BRIEF NOTE: HYDROXYUREA   Hydroxyurea was held 8/8 for Hemoglobin </= 6.0 per pharmacy protocol.   Following PRBC transfusion, patient now meets criteria to resume hydroxyurea. Consider reordering as clinically appropriate.   Reuel Boom, PharmD, BCPS [phone] 09/21/2017, 11:26 AM

## 2017-09-22 DIAGNOSIS — R000 Tachycardia, unspecified: Secondary | ICD-10-CM

## 2017-09-22 LAB — HEPATIC FUNCTION PANEL
ALT: 70 U/L — ABNORMAL HIGH (ref 0–44)
AST: 71 U/L — ABNORMAL HIGH (ref 15–41)
Albumin: 2.1 g/dL — ABNORMAL LOW (ref 3.5–5.0)
Alkaline Phosphatase: 214 U/L — ABNORMAL HIGH (ref 38–126)
BILIRUBIN DIRECT: 0.3 mg/dL — AB (ref 0.0–0.2)
BILIRUBIN INDIRECT: 0.7 mg/dL (ref 0.3–0.9)
BILIRUBIN TOTAL: 1 mg/dL (ref 0.3–1.2)
Total Protein: 5.6 g/dL — ABNORMAL LOW (ref 6.5–8.1)

## 2017-09-22 NOTE — Progress Notes (Signed)
[name] [name]. to be D/C'd Home per MD order.  Discussed prescriptions and follow up appointments with the patient. Prescriptions given to patient, medication list explained in detail. Pt verbalized understanding.  Allergies as of 09/22/2017      Reactions   Nsaids    Morphine And Related Other (See Comments)   "real bad headaches"      Medication List    STOP taking these medications   diltiazem 30 MG tablet Commonly known as:  CARDIZEM   sodium bicarbonate 650 MG tablet     TAKE these medications   ergocalciferol 50000 units capsule Commonly known as:  VITAMIN D2 Take 1 capsule (50,000 Units total) by mouth once a week.   folic acid 1 MG tablet Commonly known as:  FOLVITE Take 1 tablet (1 mg total) daily by mouth.   furosemide 40 MG tablet Commonly known as:  LASIX Take 40 mg by mouth daily.   hydroxyurea 500 MG capsule Commonly known as:  HYDREA TAKE 3 CAPSULES (1,500 MG TOTAL) BY MOUTH DAILY.   nortriptyline 25 MG capsule Commonly known as:  PAMELOR TAKE 1 CAPSULE (25 MG TOTAL) BY MOUTH AT BEDTIME.   oxyCODONE 15 mg 12 hr tablet Commonly known as:  OXYCONTIN Take 1 tablet (15 mg total) by mouth every 12 (twelve) hours.   Oxycodone HCl 10 MG Tabs Take 1 tablet (10 mg total) by mouth every 6 (six) hours as needed for up to 15 days.       Vitals:   09/22/17 1015 09/22/17 1200  BP: 102/64   Pulse: 69   Resp: 11 (!) 9  Temp: 98.4 F (36.9 C)   SpO2: 100% 100%    Skin clean, dry and intact without evidence of skin break down, no evidence of skin tears noted. IV catheter discontinued intact. Site without signs and symptoms of complications. Dressing and pressure applied. Pt denies pain at this time. No complaints noted.  An After Visit Summary was printed and given to the patient. Patient escorted via [place], and D/C home via private auto.  Haywood Lasso BSN, RN [employer] Phone [phone]

## 2017-09-22 NOTE — Discharge Summary (Signed)
Physician Discharge Summary  [name] [name]. PIR:[id number] DOB: 08-Jun-1980 DOA: 09/19/2017  PCP: Lanae Boast, FNP  Admit date: 09/19/2017  Discharge date: 09/22/2017  Discharge Diagnoses:  Active Problems:   TOBACCO ABUSE   Chest pain   Sickle cell nephropathy (HCC)   Tachycardia with heart rate 121-140 beats per minute with ambulation   Sickle cell crisis ([place])   Immune-complex glomerulonephritis   Discharge Condition: Stable  Disposition:  Follow-up Information    Lanae Boast, FNP. Schedule an appointment as soon as possible for a visit in 3 day(s).   Specialty:  Family Medicine Contact information: [place] [address] [phone]        Buford Dresser, MD .   Specialty:  Cardiology Contact information: [address] [place] [address] [phone]          Pt is discharged home in good condition and is to follow up with Lanae Boast, FNP this week to have labs evaluated. [name] A Arlotta Jr. is instructed to increase activity slowly and balance with rest for the next few days, and use prescribed medication to complete treatment of pain  Diet: Regular Wt Readings from Last 3 Encounters:  09/20/17 53 kg  09/14/17 54.9 kg  07/23/17 55.2 kg   History of present illness:  [name] [name]. is a 37 y.o. male with medical history significant sickle cell disease with nephropathy, immune complex glomerulonephritis with nephrotic syndrome. Patient is drowsy, status post Dilaudid and unable to give me detailed history. He presented to the ED with complaints of lower and upper extremity pain with chest pain that started last night.  Pain did not improve when he took his home narcotics.  Patient reports pain is similar to his prior sickle cell pain. Patient tells me chest pain sometimes occurs with his typical sickle cell pain.  Chest pain has improved today, initially reported some difficulty breathing but that  has since resolved.  Not able to tell me aggravating or relieving factors of the pain radiates, able to tell me associated symptoms.  Denies family history of premature coronary artery disease, denies personal or family history of blood clots in lungs or legs.  Smokes 1 to 2 cigarettes/week.   ED Course: Tachycardia to 123, O2 sats greater than 97% on room air.  Hgb stable 8.  Creatinine 2.4. Reticulocyte count consistent with prior.  Two-view chest x-ray negative for acute abnormality.  EKG unchanged from prior. i-STAT troponin-elevated 0.4 > 0.3. EDP concerned about PE, VQ scan was ordered, instead of CTA considering renal insufficiency. Hospitalist called to admit for sickle cell pain.  Hospital Course:  Patient was admitted for sickle cell pain crisis and Acute on CKD, and he was managed appropriately with IVF, IV Dilaudid via PCA as well as other adjunct therapies per sickle cell pain management protocols. He initially had chest pain which resolved promptly after treatment, works-up were negative for CAD or Pulmonary Embolism. His Hb dropped to 5.9 from 8.0, he was transfused with 1 unit of PRBC and Hb improved significantly. Patient remained hemodynamically stable throughout this admission. He did well, pain returned to baseline, Hb remained stable after transfusion. Serum Creatinine improved to 1.82 from 2.47 on admission. He requested to be discharged today because "I feel good and I have my medications at home". Patient was discharged home today in a hemodynamically stable condition.   Discharge Exam: Vitals:   09/22/17 0818 09/22/17 1015  BP:  102/64  Pulse:  69  Resp: (!) 9 11  Temp:  98.4 F (36.9 C)  SpO2: 98% 100%   Vitals:   09/22/17 0347 09/22/17 0419 09/22/17 0818 09/22/17 1015  BP:  101/72  102/64  Pulse:  79  69  Resp: 12 (!) 9 (!) 9 11  Temp:  98.1 F (36.7 C)  98.4 F (36.9 C)  TempSrc:  Oral  Oral  SpO2: 100% 100% 98% 100%  Weight:      Height:       General  appearance : Awake, alert, not in any distress. Speech Clear. Not toxic looking HEENT: Atraumatic and Normocephalic, pupils equally reactive to light and accomodation Neck: Supple, no JVD. No cervical lymphadenopathy.  Chest: Good air entry bilaterally, no added sounds  CVS: S1 S2 regular, no murmurs.  Abdomen: Bowel sounds present, Non tender and not distended with no gaurding, rigidity or rebound. Extremities: B/L Lower Ext shows no edema, both legs are warm to touch Neurology: Awake alert, and oriented X 3, CN II-XII intact, Non focal Skin: No Rash  Discharge Instructions  Discharge Instructions    Diet - low sodium heart healthy   Complete by:  As directed    Increase activity slowly   Complete by:  As directed      Allergies as of 09/22/2017      Reactions   Nsaids    Morphine And Related Other (See Comments)   "real bad headaches"      Medication List    STOP taking these medications   diltiazem 30 MG tablet Commonly known as:  CARDIZEM   sodium bicarbonate 650 MG tablet     TAKE these medications   ergocalciferol 50000 units capsule Commonly known as:  VITAMIN D2 Take 1 capsule (50,000 Units total) by mouth once a week.   folic acid 1 MG tablet Commonly known as:  FOLVITE Take 1 tablet (1 mg total) daily by mouth.   furosemide 40 MG tablet Commonly known as:  LASIX Take 40 mg by mouth daily.   hydroxyurea 500 MG capsule Commonly known as:  HYDREA TAKE 3 CAPSULES (1,500 MG TOTAL) BY MOUTH DAILY.   nortriptyline 25 MG capsule Commonly known as:  PAMELOR TAKE 1 CAPSULE (25 MG TOTAL) BY MOUTH AT BEDTIME.   oxyCODONE 15 mg 12 hr tablet Commonly known as:  OXYCONTIN Take 1 tablet (15 mg total) by mouth every 12 (twelve) hours.   Oxycodone HCl 10 MG Tabs Take 1 tablet (10 mg total) by mouth every 6 (six) hours as needed for up to 15 days.       The results of significant diagnostics from this hospitalization (including imaging, microbiology, ancillary  and laboratory) are listed below for reference.    Significant Diagnostic Studies: Dg Chest 2 View  Result Date: 09/19/2017 CLINICAL DATA:  Shortness of breath.  Sickle cell crisis.  Smoker. EXAM: CHEST - 2 VIEW COMPARISON:  07/23/2017, 05/15/2017, 05/12/2017 and chest CTA dated 02/27/2017. FINDINGS: Small nodular densities are again demonstrated in both lower lung zones, primarily due to vessels viewed on end. Otherwise, clear lungs. Mild peribronchial thickening. Cholecystectomy clips. Mild diffuse bone sclerosis. IMPRESSION: 1. No acute abnormality. 2. Stable bone sclerosis compatible with the history of sickle cell disease. Electronically Signed   By: Claudie Revering M.D.   On: 09/19/2017 18:29    Microbiology: No results found for this or any previous visit (from the past 240 hour(s)).   Labs: Basic Metabolic Panel: Recent Labs  Lab 09/19/17  1619 09/20/17 0530 09/21/17 1020  NA 139 136 138  K 4.4 3.6 4.1  CL 104 104 110  CO2 25 25 22   GLUCOSE 108* 120* 135*  BUN 27* 27* 20  CREATININE 2.47* 2.34* 1.82*  CALCIUM 8.4* 8.3* 8.2*  MG  --   --  2.2  PHOS  --   --  3.8   Liver Function Tests: Recent Labs  Lab 09/19/17 1619 09/22/17 0609  AST 138* 71*  ALT 125* 70*  ALKPHOS 264* 214*  BILITOT 2.0* 1.0  PROT 6.3* 5.6*  ALBUMIN 2.4* 2.1*   No results for input(s): LIPASE, AMYLASE in the last 168 hours. No results for input(s): AMMONIA in the last 168 hours. CBC: Recent Labs  Lab 09/19/17 1619 09/20/17 0530 09/21/17 1020  WBC 6.8 7.7 6.2  NEUTROABS 3.7  --  3.1  HGB 8.0* 5.9* 9.3*  HCT 21.9* 16.4* 25.5*  MCV 121.7* 120.6* 111.4*  PLT 126* 116* 146*   Cardiac Enzymes: Recent Labs  Lab 09/19/17 2318 09/20/17 0530 09/20/17 0736  TROPONINI 0.47* 0.46* 0.46*   BNP: Invalid input(s): POCBNP CBG: No results for input(s): GLUCAP in the last 168 hours.  Time coordinating discharge: 50 minutes  Signed:  [facility] 09/22/2017,  11:56 AM

## 2017-10-10 ENCOUNTER — Telehealth: Payer: Self-pay

## 2017-10-10 DIAGNOSIS — D571 Sickle-cell disease without crisis: Secondary | ICD-10-CM

## 2017-10-10 DIAGNOSIS — G894 Chronic pain syndrome: Secondary | ICD-10-CM

## 2017-10-10 DIAGNOSIS — N08 Glomerular disorders in diseases classified elsewhere: Principal | ICD-10-CM

## 2017-10-10 MED ORDER — OXYCODONE HCL ER 15 MG PO T12A: 15.0000 mg | EXTENDED_RELEASE_TABLET | Freq: Two times a day (BID) | ORAL | 0 refills | Status: DC

## 2017-10-10 NOTE — Telephone Encounter (Signed)
refilled 

## 2017-10-12 ENCOUNTER — Telehealth: Payer: Self-pay

## 2017-10-12 NOTE — Telephone Encounter (Signed)
Please verify that the medication has not been picked up at the pharmacy by the patient.  Thanks.

## 2017-10-12 NOTE — Telephone Encounter (Signed)
Per [facility] Bal at [facility] they have NOT filled it because it is not in stock. Thanks!

## 2017-10-16 ENCOUNTER — Telehealth: Payer: Self-pay

## 2017-10-16 DIAGNOSIS — G894 Chronic pain syndrome: Secondary | ICD-10-CM

## 2017-10-16 DIAGNOSIS — N08 Glomerular disorders in diseases classified elsewhere: Principal | ICD-10-CM

## 2017-10-16 DIAGNOSIS — D571 Sickle-cell disease without crisis: Secondary | ICD-10-CM

## 2017-10-16 MED ORDER — OXYCODONE HCL ER 15 MG PO T12A: 15.0000 mg | EXTENDED_RELEASE_TABLET | Freq: Two times a day (BID) | ORAL | 0 refills | Status: DC

## 2017-10-16 NOTE — Telephone Encounter (Signed)
This has been addressed.

## 2017-10-16 NOTE — Telephone Encounter (Signed)
Patient still hasn't received the rx sent into the correct pharmacy ([facility] [address]) He is out of medication and [facility] [address] [facility] not [facility] this in stock. Can you please send to [address]? Thank you!

## 2017-10-16 NOTE — Telephone Encounter (Signed)
Sent oxycontin 15mg  Q12 h to requested pharmacy

## 2017-10-17 ENCOUNTER — Telehealth: Payer: Self-pay

## 2017-10-17 MED ORDER — OXYCODONE HCL 10 MG PO TABS: 10.0000 mg | ORAL_TABLET | Freq: Four times a day (QID) | ORAL | 0 refills | Status: AC | PRN

## 2017-10-17 NOTE — Telephone Encounter (Signed)
Can you please send in rx for oxycodone 10mg  IR [facility] [address]? Please advise. Thanks!

## 2017-10-17 NOTE — Telephone Encounter (Signed)
Sent to pharmacy 

## 2017-10-18 ENCOUNTER — Ambulatory Visit (INDEPENDENT_AMBULATORY_CARE_PROVIDER_SITE_OTHER): Payer: Medicare Other | Admitting: Family Medicine

## 2017-10-18 ENCOUNTER — Encounter: Payer: Self-pay | Admitting: Family Medicine

## 2017-10-18 VITALS — BP 109/73 | HR 116 | Temp 97.9°F | Resp 14 | Ht 63.0 in | Wt 121.0 lb

## 2017-10-18 DIAGNOSIS — G894 Chronic pain syndrome: Secondary | ICD-10-CM | POA: Diagnosis not present

## 2017-10-18 DIAGNOSIS — E559 Vitamin D deficiency, unspecified: Secondary | ICD-10-CM

## 2017-10-18 DIAGNOSIS — D571 Sickle-cell disease without crisis: Secondary | ICD-10-CM | POA: Diagnosis not present

## 2017-10-18 NOTE — Patient Instructions (Signed)

## 2017-10-18 NOTE — Progress Notes (Signed)
PATIENT CARE CENTER INTERNAL MEDICINE AND SICKLE CELL CARE  SICKLE CELL ANEMIA FOLLOW UP VISIT PROVIDER: Lanae Boast, FNP    Subjective:   [name] [name].  is a 37 y.o.  male who  has a past medical history of Arachnoid Cyst of brain bilaterally, Bacterial pneumonia (~ 2012), Chronic kidney disease, CKD (chronic kidney disease), stage II, GERD (gastroesophageal reflux disease), Gynecomastia, male (07/10/2012), History of blood transfusion, Immune-complex glomerulonephritis (06/1992), Migraines, Sickle cell anemia ([place]), Sickle cell crisis ([place]) (09/25/2012), Sickle cell nephropathy ([place]) (07/10/2012), Sinus tachycardia, and Tachycardia with heart rate 121-140 beats per minute with ambulation (08/04/2016).  He presents for a follow up for Sickle Cell Anemia. his last hospitalization was 09/19/2017. he has had greater than hospitalizations in the past 12 months.  Pain regimen includes: Ibuprofen, Oxycontin 15mg  q 12 h and oxycodone q 6 h prn Hydrea Therapy: Yes Medication compliance: Yes  Pain today is 5/10 and located in the bilateral legs.  Patient reports drinking 32-64 ounces of fluid per day.  Patient UDS positive for cocaine on 09/19/2017  Review of Systems  Constitutional: Negative.   HENT: Negative.   Eyes: Negative.   Respiratory: Negative.   Cardiovascular: Negative.   Gastrointestinal: Negative.   Genitourinary: Negative.   Musculoskeletal: Positive for joint pain and myalgias.  Skin: Negative.   Neurological: Negative.   Psychiatric/Behavioral: Negative.     Objective:   Objective  BP 109/73 (BP Location: Left Arm, Patient Position: Sitting, Cuff Size: Small)   Pulse (!) 116   Temp 97.9 F (36.6 C) (Oral)   Resp 14   Ht 5\' 3"  (1.6 m)   Wt 121 lb (54.9 kg)   SpO2 99%   BMI 21.43 kg/m    Physical Exam  Constitutional: He is oriented to person, place, and time. He appears well-developed and well-nourished.  HENT:  Head: Normocephalic and atraumatic.    Right Ear: External ear normal.  Left Ear: External ear normal.  Nose: Nose normal.  Mouth/Throat: Oropharynx is clear and moist.  Eyes: Pupils are equal, round, and reactive to light. Conjunctivae and EOM are normal.  Neck: Normal range of motion. Neck supple. No tracheal deviation present. No thyromegaly present.  Cardiovascular: Normal rate, regular rhythm, normal heart sounds and intact distal pulses. Exam reveals no friction rub.  No murmur heard. Pulmonary/Chest: Effort normal and breath sounds normal. No stridor. No respiratory distress. He has no wheezes.  Abdominal: Soft. Bowel sounds are normal. He exhibits no distension and no mass. There is no tenderness.  Musculoskeletal: Normal range of motion. He exhibits tenderness (lower extremities).  Lymphadenopathy:    He has no cervical adenopathy.  Neurological: He is alert and oriented to person, place, and time.  Skin: Skin is warm and dry.  Psychiatric: He has a normal mood and affect. His behavior is normal. Judgment and thought content normal.  Nursing note and vitals reviewed.    Assessment/Plan:   Assessment   Encounter Diagnosis  Name Primary?  . Sickle cell disease, type SS.  Yes     Plan   1. Sickle cell disease, type SS.  - Hepatic Function Panel - Basic Metabolic Panel - CBC With Differential - [id number] 9+OXYCODONE+CRT-UNBUND - Ambulatory referral to Pain Clinic - Vitamin D, 25-hydroxy  2. Chronic pain syndrome  - Ambulatory referral to Pain Clinic  3. Vitamin D deficiency  - Vitamin D, 25-hydroxy we discussed that he needs 2 negative UDS for this provider to prescribe narcotics. Offered referral to pain  management.   Return to care as scheduled and prn. Patient verbalized understanding and agreed with plan of care.   1. Sickle cell disease - Continue Hydrea  We discussed the need for good hydration, monitoring of hydration status, avoidance of heat, cold, stress, and infection triggers. We discussed  the risks and benefits of Hydrea, including bone marrow suppression, the possibility of GI upset, skin ulcers, hair thinning, and teratogenicity. The patient was reminded of the need to seek medical attention of any symptoms of bleeding, anemia, or infection. Continue folic acid 1 mg daily to prevent aplastic bone marrow crises.   2. Pulmonary evaluation - Patient denies severe recurrent wheezes, shortness of breath with exercise, or persistent cough. If these symptoms develop, pulmonary function tests with spirometry will be ordered, and if abnormal, plan on referral to Pulmonology for further evaluation.  3. Cardiac - Routine screening for pulmonary hypertension is not recommended.  4. Eye - High risk of proliferative retinopathy. Annual eye exam with retinal exam recommended to patient.  5. Immunization status -  Yearly influenza vaccination is recommended, as well as being up to date with Meningococcal and Pneumococcal vaccines.   6. Acute and chronic painful episodes - We discussed that pt is to receive Schedule II prescriptions only from [place]. Pt is also aware that the prescription history is available to [place] online through the [facility] CSRS. Controlled substance agreement signed. We reminded [name] [name]. that all patients receiving Schedule II narcotics must be seen for follow within one month of prescription being requested. We reviewed the terms of our pain agreement, including the need to keep medicines in a safe locked location away from children or pets, and the need to report excess sedation or constipation, measures to avoid constipation, and policies related to early refills and stolen prescriptions. According to the [place] Chronic Pain Initiative program, we have reviewed details related to analgesia, adverse effects, aberrant behaviors.  7. Iron overload from chronic transfusion.  Not applicable at this time.  If this occurs will use Exjade for management.   8. Vitamin D deficiency -  Drisdol 50,000 units weekly. Patient encouraged to take as prescribed.   The above recommendations are taken from the NIH Evidence-Based Management of Sickle Cell Disease: Expert Panel Report, 20149.   [name], FNP-BC Patient [facility] [address] [place], [place] [address] [phone]  This note has been created with Dragon speech recognition software and smart phrase technology. Any transcriptional errors are unintentional.

## 2017-10-19 LAB — MED LIST OPTION NOT SELECTED

## 2017-10-23 LAB — COCAINE (GC/MS), URINE
BENZOYLECGONINE (GC/MS): 1494 ng/mL
COCAINE + METABOLITE: POSITIVE — AB

## 2017-10-23 LAB — OPIATES CONFIRMATION, URINE: Opiates: NEGATIVE ng/mL

## 2017-10-23 LAB — 737588 9+OXYCODONE+CRT-UNBUND
Amphetamine Scrn, Ur: NEGATIVE ng/mL
BARBITURATE SCREEN URINE: NEGATIVE ng/mL
BENZODIAZEPINE SCREEN, URINE: NEGATIVE ng/mL
CANNABINOIDS UR QL SCN: NEGATIVE ng/mL
Creatinine(Crt), U: 76 mg/dL (ref 20.0–300.0)
Methadone Screen, Urine: NEGATIVE ng/mL
Ph of Urine: 7.9 (ref 4.5–8.9)
Phencyclidine Qn, Ur: NEGATIVE ng/mL
Propoxyphene Scrn, Ur: NEGATIVE ng/mL

## 2017-10-23 LAB — OXYCODONE/OXYMORPHONE CONFIRM
OXYCODONE/OXYMORPH: POSITIVE — AB
OXYCODONE: >3000 ng/mL
OXYCODONE: POSITIVE — AB
OXYMORPHONE: NEGATIVE

## 2017-10-24 ENCOUNTER — Ambulatory Visit: Payer: Medicare Other | Admitting: Cardiovascular Disease

## 2017-10-24 DIAGNOSIS — D571 Sickle-cell disease without crisis: Secondary | ICD-10-CM | POA: Diagnosis not present

## 2017-10-24 DIAGNOSIS — N183 Chronic kidney disease, stage 3 (moderate): Secondary | ICD-10-CM | POA: Diagnosis not present

## 2017-10-24 DIAGNOSIS — N08 Glomerular disorders in diseases classified elsewhere: Secondary | ICD-10-CM | POA: Diagnosis not present

## 2017-10-24 DIAGNOSIS — R809 Proteinuria, unspecified: Secondary | ICD-10-CM | POA: Diagnosis not present

## 2017-10-24 DIAGNOSIS — D7589 Other specified diseases of blood and blood-forming organs: Secondary | ICD-10-CM | POA: Diagnosis not present

## 2017-10-24 DIAGNOSIS — R945 Abnormal results of liver function studies: Secondary | ICD-10-CM | POA: Diagnosis not present

## 2017-10-24 DIAGNOSIS — Z720 Tobacco use: Secondary | ICD-10-CM | POA: Diagnosis not present

## 2017-10-24 DIAGNOSIS — N049 Nephrotic syndrome with unspecified morphologic changes: Secondary | ICD-10-CM | POA: Diagnosis not present

## 2017-11-15 ENCOUNTER — Ambulatory Visit (INDEPENDENT_AMBULATORY_CARE_PROVIDER_SITE_OTHER): Payer: Medicare Other | Admitting: Family Medicine

## 2017-11-15 ENCOUNTER — Encounter: Payer: Self-pay | Admitting: Family Medicine

## 2017-11-15 ENCOUNTER — Telehealth: Payer: Self-pay

## 2017-11-15 VITALS — BP 124/79 | HR 130 | Temp 98.8°F | Resp 14 | Ht 63.0 in | Wt 119.0 lb

## 2017-11-15 DIAGNOSIS — N08 Glomerular disorders in diseases classified elsewhere: Secondary | ICD-10-CM

## 2017-11-15 DIAGNOSIS — Z79891 Long term (current) use of opiate analgesic: Secondary | ICD-10-CM | POA: Diagnosis not present

## 2017-11-15 DIAGNOSIS — Z5181 Encounter for therapeutic drug level monitoring: Secondary | ICD-10-CM | POA: Diagnosis not present

## 2017-11-15 DIAGNOSIS — D5700 Hb-SS disease with crisis, unspecified: Secondary | ICD-10-CM | POA: Diagnosis not present

## 2017-11-15 DIAGNOSIS — Z23 Encounter for immunization: Secondary | ICD-10-CM | POA: Diagnosis not present

## 2017-11-15 DIAGNOSIS — R8290 Unspecified abnormal findings in urine: Secondary | ICD-10-CM | POA: Diagnosis not present

## 2017-11-15 DIAGNOSIS — D5709 Hb-ss disease with crisis with other specified complication: Secondary | ICD-10-CM

## 2017-11-15 LAB — POCT URINALYSIS DIPSTICK
Bilirubin, UA: NEGATIVE
Blood, UA: NEGATIVE
Glucose, UA: NEGATIVE
Ketones, UA: 15
Nitrite, UA: POSITIVE
Protein, UA: NEGATIVE
Spec Grav, UA: 1.015 (ref 1.010–1.025)
Urobilinogen, UA: 0.2 E.U./dL
pH, UA: 7 (ref 5.0–8.0)

## 2017-11-15 MED ORDER — GABAPENTIN 100 MG PO CAPS: 100.0000 mg | ORAL_CAPSULE | Freq: Three times a day (TID) | ORAL | 1 refills | Status: DC

## 2017-11-15 NOTE — Progress Notes (Signed)
PATIENT CARE CENTER INTERNAL MEDICINE AND SICKLE CELL CARE  SICKLE CELL ANEMIA FOLLOW UP VISIT PROVIDER: Lanae Boast, FNP    Subjective:   [name] [name].  is a 37 y.o.  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 presents for a follow up for Sickle Cell Anemia. his last hospitalization was 09/19/2017 Pain regimen includes: oxycodone Hydrea Therapy: Yes Medication compliance: Yes  Pain today is 6/10.     Review of Systems  Constitutional: Negative.   HENT: Negative.   Eyes: Negative.   Respiratory: Negative.   Cardiovascular: Negative.   Gastrointestinal: Negative.   Genitourinary: Negative.   Musculoskeletal: Positive for myalgias.  Skin: Negative.   Neurological: Negative.   Psychiatric/Behavioral: Negative.     Objective:   Objective  BP 124/79 (BP Location: Left Arm, Patient Position: Sitting, Cuff Size: Normal)   Pulse (!) 130   Temp 98.8 F (37.1 C) (Oral)   Resp 14   Ht 5\' 3"  (1.6 m)   Wt 119 lb (54 kg)   SpO2 100%   BMI 21.08 kg/m    Physical Exam  Constitutional: He is oriented to person, place, and time. He appears well-developed and well-nourished. No distress.  HENT:  Head: Normocephalic and atraumatic.  Eyes: Pupils are equal, round, and reactive to light. Conjunctivae and EOM are normal.  Neck: Normal range of motion.  Cardiovascular: Normal rate, regular rhythm, normal heart sounds and intact distal pulses.  Pulmonary/Chest: Effort normal and breath  sounds normal. No respiratory distress.  Abdominal: He exhibits distension.  Musculoskeletal: Normal range of motion.  Neurological: He is alert and oriented to person, place, and time.  Skin: Skin is warm and dry.  Psychiatric: He has a normal mood and affect. His behavior is normal. Thought content normal.  Nursing note and vitals reviewed.    Assessment/Plan:   Assessment   Encounter Diagnosis  Name Primary?  . Sickle cell nephropathy with crisis ([place]) Yes     Plan  1. Sickle cell nephropathy with crisis [facility]) Patient declines blood work today due to recent labs with nephrology.  Will request records Patient has been positive x 2 UDS for cocaine. Patient was referred to pain management. His appointment was yesterday. Called [facility] and informed that patient cancelled the visit. Patient with visit next week. Jackquline [name] called and informed patient of this appt.  Discussed that patient will need to have 2 negative UDS prior to receiving narcs from this office. UDS today and in 2 weeks.  Patient advised to start gabapentin for pain.  - Urinalysis Dipstick - [id number] 11+Oxyco+Alc+Crt-Bund  2. Abnormal urinalysis Patient denies symptoms - Urine Culture   Return to care as scheduled and prn. Patient verbalized understanding and agreed with plan of care.   1. Sickle cell disease - Continue Hydrea We discussed the need for good hydration, monitoring of hydration status, avoidance of heat, cold, stress, and infection triggers. We discussed the risks and benefits of Hydrea, including bone marrow suppression, the possibility of GI upset, skin ulcers, hair thinning, and teratogenicity. The patient was reminded of the need to seek medical attention of any  symptoms of bleeding, anemia, or infection. Continue folic acid 1 mg daily to prevent aplastic bone marrow crises.   2. Pulmonary evaluation - Patient denies severe recurrent wheezes, shortness of breath with exercise, or  persistent cough. If these symptoms develop, pulmonary function tests with spirometry will be ordered, and if abnormal, plan on referral to Pulmonology for further evaluation.  3. Cardiac - Routine screening for pulmonary hypertension is not recommended.  4. Eye - High risk of proliferative retinopathy. Annual eye exam with retinal exam recommended to patient.  5. Immunization status -  Yearly influenza vaccination is recommended, as well as being up to date with Meningococcal and Pneumococcal vaccines.   6. Acute and chronic painful episodes - We discussed that pt is to receive Schedule II prescriptions only from [place]. Pt is also aware that the prescription history is available to [place] online through the [facility] CSRS. Controlled substance agreement signed . We reminded [name] [name]. that all patients receiving Schedule II narcotics must be seen for follow within one month of prescription being requested. We reviewed the terms of our pain agreement, including the need to keep medicines in a safe locked location away from children or pets, and the need to report excess sedation or constipation, measures to avoid constipation, and policies related to early refills and stolen prescriptions. According to the [place] Chronic Pain Initiative program, we have reviewed details related to analgesia, adverse effects, aberrant behaviors.  7. Iron overload from chronic transfusion.  Not applicable at this time.  If this occurs will use Exjade for management.   8. Vitamin D deficiency - Drisdol 50,000 units weekly. Patient encouraged to take as prescribed.   The above recommendations are taken from the NIH Evidence-Based Management of Sickle Cell Disease: Expert Panel Report, 20149.   [name], FNP-BC Patient [facility] [address] [place],  [address] [phone]  This note has been created with Dragon speech recognition software and smart phrase technology.  Any transcriptional errors are unintentional.

## 2017-11-15 NOTE — Patient Instructions (Signed)

## 2017-11-15 NOTE — Telephone Encounter (Signed)
[facility] he missed an appointment on 11/14/2017 but has been rescheduled for 11/20/2017 @12 :30pm.  I have called patient and informed him of this appointment and asked that he please keep this appointment if possible. Patient verbalized understanding. Thanks!

## 2017-11-17 LAB — URINE CULTURE: Organism ID, Bacteria: NO GROWTH

## 2017-11-20 LAB — DRUG SCREEN 764883 11+OXYCO+ALC+CRT-BUND
Amphetamines, Urine: NEGATIVE ng/mL
BENZODIAZ UR QL: NEGATIVE ng/mL
Barbiturate: NEGATIVE ng/mL
Cannabinoid Quant, Ur: NEGATIVE ng/mL
Creatinine: 68.7 mg/dL (ref 20.0–300.0)
Ethanol: NEGATIVE %
Meperidine: NEGATIVE ng/mL
Methadone Screen, Urine: NEGATIVE ng/mL
Phencyclidine: NEGATIVE ng/mL
Propoxyphene: NEGATIVE ng/mL
Tramadol: NEGATIVE ng/mL
pH, Urine: 7.9 (ref 4.5–8.9)

## 2017-11-20 LAB — COCAINE CONF, UR
Benzoylecgonine GC/MS Conf: 663 ng/mL
Cocaine Metab Quant, Ur: POSITIVE — AB

## 2017-11-20 LAB — OXYCODONE/OXYMORPHONE, CONFIRM
OXYCODONE/OXYMORPH: POSITIVE — AB
OXYCODONE: 2582 ng/mL
OXYCODONE: POSITIVE — AB
OXYMORPHONE: NEGATIVE

## 2017-11-20 LAB — OPIATES CONFIRMATION, URINE: Opiates: NEGATIVE ng/mL

## 2017-11-23 ENCOUNTER — Ambulatory Visit: Payer: Medicare Other | Admitting: Cardiovascular Disease

## 2017-11-26 ENCOUNTER — Other Ambulatory Visit: Payer: Self-pay

## 2017-11-26 ENCOUNTER — Encounter: Payer: Self-pay | Admitting: *Deleted

## 2017-11-26 MED ORDER — GABAPENTIN 100 MG PO CAPS: 100.0000 mg | ORAL_CAPSULE | Freq: Three times a day (TID) | ORAL | 1 refills | Status: DC

## 2017-11-27 ENCOUNTER — Encounter (HOSPITAL_COMMUNITY): Payer: Self-pay | Admitting: Emergency Medicine

## 2017-11-27 ENCOUNTER — Telehealth: Payer: Self-pay

## 2017-11-27 ENCOUNTER — Emergency Department (HOSPITAL_COMMUNITY)
Admission: EM | Admit: 2017-11-27 | Discharge: 2017-11-27 | Disposition: A | Discharge status: Home / Self Care | Payer: Medicare Other | Attending: Emergency Medicine | Admitting: Emergency Medicine

## 2017-11-27 ENCOUNTER — Other Ambulatory Visit: Payer: Self-pay

## 2017-11-27 DIAGNOSIS — Z79899 Other long term (current) drug therapy: Secondary | ICD-10-CM | POA: Diagnosis not present

## 2017-11-27 DIAGNOSIS — F17210 Nicotine dependence, cigarettes, uncomplicated: Secondary | ICD-10-CM | POA: Insufficient documentation

## 2017-11-27 DIAGNOSIS — M79604 Pain in right leg: Secondary | ICD-10-CM | POA: Diagnosis present

## 2017-11-27 DIAGNOSIS — D5700 Hb-SS disease with crisis, unspecified: Secondary | ICD-10-CM | POA: Diagnosis not present

## 2017-11-27 DIAGNOSIS — N182 Chronic kidney disease, stage 2 (mild): Secondary | ICD-10-CM | POA: Diagnosis not present

## 2017-11-27 DIAGNOSIS — N189 Chronic kidney disease, unspecified: Secondary | ICD-10-CM | POA: Diagnosis not present

## 2017-11-27 LAB — COMPREHENSIVE METABOLIC PANEL WITH GFR
ALT: 67 U/L — ABNORMAL HIGH (ref 0–44)
AST: 87 U/L — ABNORMAL HIGH (ref 15–41)
Albumin: 2.9 g/dL — ABNORMAL LOW (ref 3.5–5.0)
Alkaline Phosphatase: 245 U/L — ABNORMAL HIGH (ref 38–126)
Anion gap: 12 (ref 5–15)
BUN: 31 mg/dL — ABNORMAL HIGH (ref 6–20)
CO2: 23 mmol/L (ref 22–32)
Calcium: 8.7 mg/dL — ABNORMAL LOW (ref 8.9–10.3)
Chloride: 106 mmol/L (ref 98–111)
Creatinine, Ser: 2.44 mg/dL — ABNORMAL HIGH (ref 0.61–1.24)
GFR calc Af Amer: 38 mL/min — ABNORMAL LOW
GFR calc non Af Amer: 33 mL/min — ABNORMAL LOW
Glucose, Bld: 90 mg/dL (ref 70–99)
Potassium: 3.1 mmol/L — ABNORMAL LOW (ref 3.5–5.1)
Sodium: 141 mmol/L (ref 135–145)
Total Bilirubin: 2.6 mg/dL — ABNORMAL HIGH (ref 0.3–1.2)
Total Protein: 6.9 g/dL (ref 6.5–8.1)

## 2017-11-27 LAB — RETICULOCYTES
Immature Retic Fract: 34.7 % — ABNORMAL HIGH (ref 2.3–15.9)
RBC.: 1.6 MIL/uL — ABNORMAL LOW (ref 4.22–5.81)
RETIC COUNT ABSOLUTE: 160 10*3/uL (ref 19.0–186.0)
Retic Ct Pct: 10 % — ABNORMAL HIGH (ref 0.4–3.1)

## 2017-11-27 LAB — CBC WITH DIFFERENTIAL/PLATELET
Abs Immature Granulocytes: 0.02 10*3/uL (ref 0.00–0.07)
BASOS PCT: 0 %
Basophils Absolute: 0 10*3/uL (ref 0.0–0.1)
Eosinophils Absolute: 0.1 10*3/uL (ref 0.0–0.5)
Eosinophils Relative: 1 %
HCT: 21.3 % — ABNORMAL LOW (ref 39.0–52.0)
Hemoglobin: 7.6 g/dL — ABNORMAL LOW (ref 13.0–17.0)
IMMATURE GRANULOCYTES: 0 %
Lymphocytes Relative: 40 %
Lymphs Abs: 3.2 10*3/uL (ref 0.7–4.0)
MCH: 47.5 pg — ABNORMAL HIGH (ref 26.0–34.0)
MCHC: 35.7 g/dL (ref 30.0–36.0)
MCV: 133.1 fL — AB (ref 80.0–100.0)
MONO ABS: 0.4 10*3/uL (ref 0.1–1.0)
Monocytes Relative: 4 %
NEUTROS ABS: 4.4 10*3/uL (ref 1.7–7.7)
NEUTROS PCT: 55 %
PLATELETS: 300 10*3/uL (ref 150–400)
RBC: 1.6 MIL/uL — AB (ref 4.22–5.81)
RDW: 24.7 % — AB (ref 11.5–15.5)
WBC: 8 10*3/uL (ref 4.0–10.5)
nRBC: 8.5 % — ABNORMAL HIGH (ref 0.0–0.2)

## 2017-11-27 MED ORDER — HYDROMORPHONE HCL 1 MG/ML IJ SOLN: 0.5000 mg | INTRAMUSCULAR | Status: AC

## 2017-11-27 MED ORDER — OXYCODONE HCL 10 MG PO TABS: 10.0000 mg | ORAL_TABLET | Freq: Four times a day (QID) | ORAL | 0 refills | Status: DC | PRN

## 2017-11-27 MED ORDER — POTASSIUM CHLORIDE CRYS ER 20 MEQ PO TBCR
40.0000 meq | EXTENDED_RELEASE_TABLET | Freq: Once | ORAL | Status: AC
  Administered 2017-11-27: 40 meq via ORAL
  Filled 2017-11-27: qty 2

## 2017-11-27 MED ORDER — HYDROMORPHONE HCL 1 MG/ML IJ SOLN
1.0000 mg | INTRAMUSCULAR | Status: AC
  Administered 2017-11-27: 1 mg via INTRAVENOUS
  Filled 2017-11-27: qty 1

## 2017-11-27 MED ORDER — HYDROMORPHONE HCL 1 MG/ML IJ SOLN
0.5000 mg | INTRAMUSCULAR | Status: AC
  Administered 2017-11-27: 0.5 mg via INTRAVENOUS
  Filled 2017-11-27: qty 1

## 2017-11-27 MED ORDER — DIPHENHYDRAMINE HCL 25 MG PO CAPS
25.0000 mg | ORAL_CAPSULE | Freq: Once | ORAL | Status: AC
  Administered 2017-11-27: 25 mg via ORAL
  Filled 2017-11-27: qty 1

## 2017-11-27 MED ORDER — OXYCODONE-ACETAMINOPHEN 5-325 MG PO TABS
2.0000 | ORAL_TABLET | Freq: Once | ORAL | Status: AC
  Administered 2017-11-27: 2 via ORAL
  Filled 2017-11-27: qty 2

## 2017-11-27 MED ORDER — HYDROMORPHONE HCL 1 MG/ML IJ SOLN: 1.0000 mg | INTRAMUSCULAR | Status: AC

## 2017-11-27 MED ORDER — DEXTROSE-NACL 5-0.45 % IV SOLN
INTRAVENOUS | Status: DC
  Administered 2017-11-27: 08:00:00 via INTRAVENOUS

## 2017-11-27 MED ORDER — METHOCARBAMOL 500 MG PO TABS
1000.0000 mg | ORAL_TABLET | Freq: Once | ORAL | Status: AC
  Administered 2017-11-27: 1000 mg via ORAL
  Filled 2017-11-27: qty 2

## 2017-11-27 NOTE — ED Provider Notes (Signed)
[facility] DEPT Provider Note   CSN: [id number] Arrival date & time: 11/27/17  0240     History   Chief Complaint Chief Complaint  Patient presents with  . Sickle Cell Pain Crisis    HPI [name] [name]. is a 37 y.o. male.  HPI Patient presents with bilateral lower extremity cramping and pain.  States this is a common presentation for his sickle cell crises.  Symptoms started overnight and is only taken Aleve with minimal improvement.  States he has an appointment with his sickle cell doctor on Thursday to refill his other medications.  No fever, chest pain or shortness of breath.  No nausea or vomiting.  No recent injuries. Past Medical History:  Diagnosis Date  . Arachnoid Cyst of brain bilaterally    "2 really small ones in the back of my head; inside; saw them w/MRI" (09/25/2012)  . Bacterial pneumonia ~ 2012   "caught it here in the hospital" (09/25/2012)  . Chronic kidney disease    "from my sickle cell" (09/25/2012)  . CKD (chronic kidney disease), stage II   . GERD (gastroesophageal reflux disease)    "after I eat alot of spicey foods" (09/25/2012)  . Gynecomastia, male 07/10/2012  . History of blood transfusion    "always related to sickle cell crisis" (09/25/2012)  . 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   Noted in noted from [facility]  . Migraines    "take RX qd to prevent them" (09/25/2012)  . Sickle cell anemia (HCC)   . Sickle cell crisis ([place]) 09/25/2012  . Sickle cell nephropathy ([place]) 07/10/2012  . Sinus tachycardia   . Tachycardia with heart rate 121-140 beats per minute with ambulation 08/04/2016    Patient Active Problem List   Diagnosis Date Noted  . Chronic pain syndrome 08/15/2017  . GERD (gastroesophageal reflux disease) 07/25/2017  . Vitamin D deficiency 05/13/2017  . Sickle cell anemia with crisis ([place]) 05/12/2017  . LLQ abdominal pain   . Nephrotic syndrome   . Abnormal CT of  the abdomen   . Immune-complex glomerulonephritis   . Other ascites   . RUQ pain   . Nausea vomiting and diarrhea 04/20/2017  . Colitis 04/07/2017  . Abnormal liver function   . HCAP (healthcare-associated pneumonia)   . QT prolongation   . Pneumonia 02/27/2017  . Elevated troponin 02/27/2017  . Diarrhea 02/27/2017  . Soft tissue swelling of chest wall 12/18/2016  . Hypoxia   . Acute kidney injury superimposed on chronic kidney disease ([place]) 12/13/2016  . Vasoocclusive sickle cell crisis ([place]) 12/13/2016  . Sickle cell crisis ([place]) 10/14/2016  . Hyponatremia 10/14/2016  . Tachycardia with heart rate 121-140 beats per minute with ambulation 08/04/2016  . Metabolic acidosis 97/35/3299  . Leucocytosis 08/02/2016  . Anemia 08/02/2016  . Macrocytosis due to Hydroxyurea 08/02/2016  . Acute renal failure superimposed on chronic kidney disease ([place])   . AKI (acute kidney injury) ([place])   . Chest pain   . Sickle cell anemia with pain ([place]) 03/19/2014  . Sickle cell pain crisis ([place]) 01/24/2013  . Chronic, continuous use of opioids 08/30/2012  . Chronic headaches 07/10/2012  . Gynecomastia, male 07/10/2012  . Sickle cell nephropathy ([place]) 07/10/2012  . Tachycardia 12/08/2011  . Systolic murmur 24/26/8341  . SICKLE CELL CRISIS 01/04/2010  . Migraine 11/26/2009  . Chronic kidney disease 03/06/2009  . Sickle cell disease, type SS.  06/18/2008  . TOBACCO ABUSE 05/22/2007  Past Surgical History:  Procedure Laterality Date  . CHOLECYSTECTOMY  ~ 2012  . COLONOSCOPY N/A 04/23/2017   Procedure: COLONOSCOPY;  Surgeon: Irene Shipper, MD;  Location: [facility];  Service: Endoscopy;  Laterality: N/A;  . IR FLUORO GUIDE CV LINE RIGHT  12/17/2016  . IR REMOVAL TUN CV CATH W/O FL  12/21/2016  . IR US GUIDE VASC ACCESS RIGHT  12/17/2016  . spleenectomy          Home Medications    Prior to Admission medications   Medication Sig Start Date End Date Taking? Authorizing Provider    Cholecalciferol (VITAMIN D) 2000 units tablet Take 4,000 Units by mouth daily.   Yes [provider]  folic acid (FOLVITE) 1 MG tablet Take 1 tablet (1 mg total) daily by mouth. 12/22/16  Yes Leana Gamer, MD  furosemide (LASIX) 40 MG tablet Take 40 mg by mouth daily. 08/03/17  Yes [provider]  hydroxyurea (HYDREA) 500 MG capsule TAKE 3 CAPSULES (1,500 MG TOTAL) BY MOUTH DAILY. Patient taking differently: Take 1,500 mg by mouth daily.  08/29/17  Yes Truett Mainland, DO  naproxen sodium (ALEVE) 220 MG tablet Take 440 mg by mouth daily as needed (pain).   Yes [provider]  nortriptyline (PAMELOR) 25 MG capsule TAKE 1 CAPSULE (25 MG TOTAL) BY MOUTH AT BEDTIME. Patient taking differently: Take 25 mg by mouth at bedtime.  09/03/17  Yes Donnamae Jude, MD  oxyCODONE (OXYCONTIN) 15 mg 12 hr tablet Take 15 mg by mouth every 12 (twelve) hours.   Yes [provider]  ergocalciferol (DRISDOL) 50000 units capsule Take 1 capsule (50,000 Units total) by mouth once a week. Patient not taking: Reported on 11/27/2017 05/13/17   [name], FNP  gabapentin (NEURONTIN) 100 MG capsule Take 1 capsule (100 mg total) by mouth 3 (three) times daily. Patient not taking: Reported on 11/27/2017 11/26/17   Lanae Boast, FNP  Oxycodone HCl 10 MG TABS Take 1 tablet (10 mg total) by mouth every 6 (six) hours as needed (pain). 11/27/17   Julianne Rice, MD    Family History Family History  Problem Relation Age of Onset  . Breast cancer Mother     Social History Social History   Tobacco Use  . Smoking status: Current Some Day Smoker    Types: Cigarettes  . Smokeless tobacco: Never Used  . Tobacco comment: 09/25/2012 "I don't buy cigarettes; bum one from friends q now and then"  Substance Use Topics  . Alcohol use: No    Alcohol/week: 0.0 standard drinks    Frequency: Never  . Drug use: No    Types: Marijuana    Comment: 09/25/2012 "weed was my biggest  problem; stopped that ~ 1 1/2 months ago; I don't do cocaine; can't say I never have though"     Allergies   Nsaids and Morphine and related   Review of Systems Review of Systems  Constitutional: Negative for chills and fever.  Respiratory: Negative for cough and shortness of breath.   Cardiovascular: Negative for chest pain and leg swelling.  Gastrointestinal: Negative for abdominal pain, diarrhea, nausea and vomiting.  Musculoskeletal: Positive for myalgias. Negative for back pain and neck pain.  Skin: Negative for rash and wound.  Neurological: Negative for dizziness, weakness, numbness and headaches.  All other systems reviewed and are negative.    Physical Exam Updated Vital Signs BP 105/70   Pulse 76   Temp 98.6 F (37 C) (Oral)  Resp 16   SpO2 100%   Physical Exam  Constitutional: He is oriented to person, place, and time. He appears well-developed and well-nourished. No distress.  HENT:  Head: Normocephalic and atraumatic.  Mouth/Throat: Oropharynx is clear and moist. No oropharyngeal exudate.  Eyes: Pupils are equal, round, and reactive to light. EOM are normal.  Neck: Normal range of motion. Neck supple. No JVD present.  Cardiovascular: Normal rate and regular rhythm. Exam reveals no gallop and no friction rub.  No murmur heard. Pulmonary/Chest: Effort normal and breath sounds normal. No stridor. No respiratory distress. He has no wheezes. He has no rales. He exhibits no tenderness.  Abdominal: Soft. Bowel sounds are normal. There is no tenderness. There is no rebound and no guarding.  Musculoskeletal: Normal range of motion. He exhibits tenderness. He exhibits no edema.  Full range of motion of bilateral lower extremities.  Generalized muscular tenderness to palpation in bilateral lower extremities without obvious deformity, asymmetry or swelling.  No joint effusions or warmth noted.  Distal pulses are 2+.  Lymphadenopathy:    He has no cervical adenopathy.    Neurological: He is alert and oriented to person, place, and time.  5/5 motor in all extremities.  Sensation fully intact.  Skin: Skin is warm and dry. Capillary refill takes less than 2 seconds. No rash noted. He is not diaphoretic. No erythema.  Psychiatric: He has a normal mood and affect. His behavior is normal.  Nursing note and vitals reviewed.    ED Treatments / Results  Labs (all labs ordered are listed, but only abnormal results are displayed) Labs Reviewed  COMPREHENSIVE METABOLIC PANEL - Abnormal; Notable for the following components:      Result Value   Potassium 3.1 (*)    BUN 31 (*)    Creatinine, Ser 2.44 (*)    Calcium 8.7 (*)    Albumin 2.9 (*)    AST 87 (*)    ALT 67 (*)    Alkaline Phosphatase 245 (*)    Total Bilirubin 2.6 (*)    GFR calc non Af Amer 33 (*)    GFR calc Af Amer 38 (*)    All other components within normal limits  CBC WITH DIFFERENTIAL/PLATELET - Abnormal; Notable for the following components:   RBC 1.60 (*)    Hemoglobin 7.6 (*)    HCT 21.3 (*)    MCV 133.1 (*)    MCH 47.5 (*)    RDW 24.7 (*)    nRBC 8.5 (*)    All other components within normal limits  RETICULOCYTES - Abnormal; Notable for the following components:   Retic Ct Pct 10.0 (*)    RBC. 1.60 (*)    Immature Retic Fract 34.7 (*)    All other components within normal limits    EKG None  Radiology No results found.  Procedures Procedures (including critical care time)  Medications Ordered in ED Medications  dextrose 5 %-0.45 % sodium chloride infusion ( Intravenous New Bag/Given 11/27/17 0747)  HYDROmorphone (DILAUDID) injection 0.5 mg (0.5 mg Intravenous Given 11/27/17 0747)    Or  HYDROmorphone (DILAUDID) injection 0.5 mg ( Subcutaneous See Alternative 11/27/17 0747)  HYDROmorphone (DILAUDID) injection 1 mg (1 mg Intravenous Given 11/27/17 0815)    Or  HYDROmorphone (DILAUDID) injection 1 mg ( Subcutaneous See Alternative 11/27/17 0815)  HYDROmorphone  (DILAUDID) injection 1 mg (1 mg Intravenous Given 11/27/17 0915)    Or  HYDROmorphone (DILAUDID) injection 1 mg ( Subcutaneous See Alternative 11/27/17  1281)  oxyCODONE-acetaminophen (PERCOCET/ROXICET) 5-325 MG per tablet 2 tablet (2 tablets Oral Given 11/27/17 1151)  methocarbamol (ROBAXIN) tablet 1,000 mg (1,000 mg Oral Given 11/27/17 1151)  diphenhydrAMINE (BENADRYL) capsule 25 mg (25 mg Oral Given 11/27/17 1151)  potassium chloride SA (K-DUR,KLOR-CON) CR tablet 40 mEq (40 mEq Oral Given 11/27/17 1151)     Initial Impression / Assessment and Plan / ED Course  I have reviewed the triage vital signs and the nursing notes.  Pertinent labs & imaging results that were available during my care of the patient were reviewed by me and considered in my medical decision making (see chart for details).     Given 3 doses of IV medication with some improvement of his symptoms.  Patient was offered admission or transfer to sickle cell clinic.  He is asking to be discharged home.  States he has an appointment to follow-up with sickle cell clinic in 2 days.  Admits to having run out of his oxycodone and requests small supply until he can be seen on Thursday.  Return precautions given.  Final Clinical Impressions(s) / ED Diagnoses   Final diagnoses:  Sickle cell crisis ([place])  Chronic kidney disease, unspecified CKD stage    ED Discharge Orders         Ordered    Oxycodone HCl 10 MG TABS  Every 6 hours PRN     11/27/17 1245           Julianne Rice, MD 11/27/17 1246

## 2017-11-27 NOTE — Telephone Encounter (Signed)
Called to remind of appointment on 11/29/2017. No answer. Message left. Thanks!

## 2017-11-27 NOTE — ED Triage Notes (Signed)
Pt from home with c/o bilateral leg pain secondary to [facility] that began yesterday. Pt denies Cp and SOB

## 2017-11-29 ENCOUNTER — Encounter: Payer: Self-pay | Admitting: Family Medicine

## 2017-11-29 ENCOUNTER — Ambulatory Visit (INDEPENDENT_AMBULATORY_CARE_PROVIDER_SITE_OTHER): Payer: Medicare Other | Admitting: Family Medicine

## 2017-11-29 VITALS — BP 118/68 | HR 96 | Temp 98.1°F | Ht 63.0 in | Wt 125.4 lb

## 2017-11-29 DIAGNOSIS — D571 Sickle-cell disease without crisis: Secondary | ICD-10-CM

## 2017-11-29 DIAGNOSIS — N08 Glomerular disorders in diseases classified elsewhere: Secondary | ICD-10-CM | POA: Diagnosis not present

## 2017-11-29 LAB — POCT URINALYSIS DIP (MANUAL ENTRY)
Bilirubin, UA: NEGATIVE
Blood, UA: NEGATIVE
Glucose, UA: NEGATIVE mg/dL
Ketones, POC UA: NEGATIVE mg/dL
Leukocytes, UA: NEGATIVE
Nitrite, UA: NEGATIVE
Protein Ur, POC: NEGATIVE mg/dL
Spec Grav, UA: 1.015 (ref 1.010–1.025)
Urobilinogen, UA: 0.2 E.U./dL
pH, UA: 6.5 (ref 5.0–8.0)

## 2017-11-29 NOTE — Progress Notes (Signed)
[name] and Sickle Cell Care   Progress Note: General Provider: Lanae Boast, FNP  SUBJECTIVE:   [name] [name]. is a 37 y.o. male who  has a past medical history of Arachnoid Cyst of brain bilaterally, Bacterial pneumonia (~ 2012), Chronic kidney disease, CKD (chronic kidney disease), stage II, GERD (gastroesophageal reflux disease), Gynecomastia, male (07/10/2012), History of blood transfusion, Immune-complex glomerulonephritis (06/1992), Migraines, Sickle cell anemia ([place]), Sickle cell crisis ([place]) (09/25/2012), Sickle cell nephropathy ([place]) (07/10/2012), Sinus tachycardia, and Tachycardia with heart rate 121-140 beats per minute with ambulation (08/04/2016).. Patient presents today for Follow-up (2 weeks sickle cell) Patient presents today for repeat drug screen due to being positive for cocaine for the past 2 months.  Patient was seen in the emergency department on October 15 and given oxycodone 10 mg tabs.  Patient has been referred to [place] pain clinic and has rescheduled his appointment twice.  Patient states that his appointment is now on the 21st.  Patient states that he has been taking Aleve without relief.  Patient with a history of chronic kidney disease.  Review of Systems  Constitutional: Negative.   HENT: Negative.   Eyes: Negative.   Respiratory: Negative.   Cardiovascular: Negative.   Gastrointestinal: Negative.   Genitourinary: Negative.   Musculoskeletal: Positive for joint pain and myalgias.  Skin: Negative.   Neurological: Negative.   Psychiatric/Behavioral: Negative.      OBJECTIVE: BP 118/68 (BP Location: Left Arm, Patient Position: Sitting, Cuff Size: Small)   Pulse 96   Temp 98.1 F (36.7 C) (Oral)   Ht 5\' 3"  (1.6 m)   Wt 125 lb 6.4 oz (56.9 kg)   SpO2 100%   BMI 22.21 kg/m   Physical Exam  Constitutional: He is oriented to person, place, and time. He appears well-developed and well-nourished. No distress.    HENT:  Head: Normocephalic and atraumatic.  Eyes: Pupils are equal, round, and reactive to light. Conjunctivae and EOM are normal.  Neck: Normal range of motion.  Cardiovascular: Normal rate, regular rhythm, normal heart sounds and intact distal pulses.  Pulmonary/Chest: Effort normal and breath sounds normal. No respiratory distress.  Musculoskeletal: Normal range of motion.  Neurological: He is alert and oriented to person, place, and time.  Skin: Skin is warm and dry.  Psychiatric: He has a normal mood and affect. His behavior is normal. Thought content normal.  Nursing note and vitals reviewed.   ASSESSMENT/PLAN:  1. Sickle cell nephropathy without crisis [facility]) Patient advised to contact [facility] medical pain clinic to schedule a sooner follow-up appointment.  Advised patient that due to his chronic kidney disease he should not be taking Aleve but can take Tylenol as needed for pain.  Hydration is also important to prevent sickle cell crisis.  Patient became upset and stated that "someone is tampering with my urine because there is no way that I  can still be positive for cocaine".  He states that he will go to his appointment at the pain management center because we are not doing anything for him here.  He also requests to return to care in 2 weeks for a follow-up urine drug screen. Patient is aware that he needs 2 negative UDS prior to being prescribed narcotics by this provider. Patient has been positve for cocaine x 3 UDS.   - POCT urinalysis dipstick - [id number] 11+Oxyco+Alc+Crt-Bund        The patient was given clear instructions to go to ER or return  to medical center if symptoms do not improve, worsen or new problems develop. The patient verbalized understanding and agreed with plan of care.   Ms. Doug Sou. Nathaneil Canary, FNP-BC Patient [facility] [address] [place], [place] [address] [phone]     This note has been created with Dragon  speech recognition software and smart phrase technology. Any transcriptional errors are unintentional.

## 2017-12-01 LAB — DRUG SCREEN 764883 11+OXYCO+ALC+CRT-BUND
Amphetamines, Urine: NEGATIVE ng/mL
BENZODIAZ UR QL: NEGATIVE ng/mL
Barbiturate: NEGATIVE ng/mL
Cannabinoid Quant, Ur: NEGATIVE ng/mL
Cocaine (Metabolite): NEGATIVE ng/mL
Creatinine: 75 mg/dL (ref 20.0–300.0)
Ethanol: NEGATIVE %
Meperidine: NEGATIVE ng/mL
Methadone Screen, Urine: NEGATIVE ng/mL
OPIATE SCREEN URINE: NEGATIVE ng/mL
Oxycodone/Oxymorphone, Urine: NEGATIVE ng/mL
Phencyclidine: NEGATIVE ng/mL
Propoxyphene: NEGATIVE ng/mL
Tramadol: NEGATIVE ng/mL
pH, Urine: 7.6 (ref 4.5–8.9)

## 2017-12-03 DIAGNOSIS — M129 Arthropathy, unspecified: Secondary | ICD-10-CM | POA: Diagnosis not present

## 2017-12-03 DIAGNOSIS — G894 Chronic pain syndrome: Secondary | ICD-10-CM | POA: Diagnosis not present

## 2017-12-03 DIAGNOSIS — D571 Sickle-cell disease without crisis: Secondary | ICD-10-CM | POA: Diagnosis not present

## 2017-12-03 DIAGNOSIS — Z79899 Other long term (current) drug therapy: Secondary | ICD-10-CM | POA: Diagnosis not present

## 2017-12-17 DIAGNOSIS — Z79899 Other long term (current) drug therapy: Secondary | ICD-10-CM | POA: Diagnosis not present

## 2017-12-17 DIAGNOSIS — G894 Chronic pain syndrome: Secondary | ICD-10-CM | POA: Diagnosis not present

## 2017-12-17 DIAGNOSIS — D571 Sickle-cell disease without crisis: Secondary | ICD-10-CM | POA: Diagnosis not present

## 2018-01-03 ENCOUNTER — Ambulatory Visit (INDEPENDENT_AMBULATORY_CARE_PROVIDER_SITE_OTHER): Payer: Medicare Other | Admitting: Family Medicine

## 2018-01-03 ENCOUNTER — Encounter: Payer: Self-pay | Admitting: Family Medicine

## 2018-01-03 ENCOUNTER — Ambulatory Visit: Payer: Medicare Other | Admitting: Family Medicine

## 2018-01-03 VITALS — BP 110/60 | HR 114 | Temp 99.6°F | Resp 16 | Ht 63.0 in | Wt 121.0 lb

## 2018-01-03 DIAGNOSIS — N08 Glomerular disorders in diseases classified elsewhere: Secondary | ICD-10-CM

## 2018-01-03 DIAGNOSIS — D5700 Hb-SS disease with crisis, unspecified: Secondary | ICD-10-CM | POA: Diagnosis not present

## 2018-01-03 DIAGNOSIS — D5709 Hb-ss disease with crisis with other specified complication: Secondary | ICD-10-CM

## 2018-01-03 LAB — POCT URINALYSIS DIPSTICK
Bilirubin, UA: NEGATIVE
Blood, UA: NEGATIVE
Glucose, UA: NEGATIVE
Ketones, UA: 15
Leukocytes, UA: NEGATIVE
Nitrite, UA: NEGATIVE
Protein, UA: NEGATIVE
Spec Grav, UA: 1.015 (ref 1.010–1.025)
Urobilinogen, UA: 0.2 E.U./dL
pH, UA: 6 (ref 5.0–8.0)

## 2018-01-03 MED ORDER — ACETAMINOPHEN-CODEINE #3 300-30 MG PO TABS: 1.0000 | ORAL_TABLET | ORAL | 0 refills | Status: AC | PRN

## 2018-01-03 MED ORDER — GABAPENTIN 300 MG PO CAPS: 300.0000 mg | ORAL_CAPSULE | Freq: Three times a day (TID) | ORAL | 3 refills | Status: DC

## 2018-01-03 NOTE — Progress Notes (Signed)
PATIENT CARE CENTER INTERNAL MEDICINE AND SICKLE CELL CARE  SICKLE CELL ANEMIA FOLLOW UP VISIT PROVIDER: Lanae Boast, FNP    Subjective:   [name] [name].  is a 37 y.o.  male who  has a past medical history of Arachnoid Cyst of brain bilaterally, Bacterial pneumonia (~ 2012), Chronic kidney disease, CKD (chronic kidney disease), stage II, GERD (gastroesophageal reflux disease), Gynecomastia, male (07/10/2012), History of blood transfusion, Immune-complex glomerulonephritis (06/1992), Migraines, Sickle cell anemia ([place]), Sickle cell crisis ([place]) (09/25/2012), Sickle cell nephropathy ([place]) (07/10/2012), Sinus tachycardia, and Tachycardia with heart rate 121-140 beats per minute with ambulation (08/04/2016). presents for a follow up for Sickle Cell Anemia.  Patient is a former patient of Dr. Alyson Ingles.  He was referred to pain management and discharged due to multiple positive urine drug screens.  Patient advised that he would need to have 2 consecutive negative drug screens prior to being prescribed medication from this provider.  He was referred to [place] pain clinic and was discharged due to being positive for cocaine. He is here to discuss pain management today.  Patient states that he has been taking tylenol with minimal relief.  He has a history of sickle cell nephropathy and is not advised to take nonsteroidal anti-inflammatories.  Patient states that he is not feeling well today. He states that "I know I need to go to the hospital, but I am not going because they will admit me. I am refusing to go to the hospital and I need to go to work. I don't feel like getting stuck with needles".   The patient has had 2 admissions in the past 6 months.  His pain is 8/10 and is generalized.  He reports compliance with Hydrea therapy.  He also reports adequate hydration daily. Patient denies use of illegal drugs. Review of Systems  Constitutional: Positive for fever.  Musculoskeletal:  Positive for joint pain and myalgias.  Psychiatric/Behavioral: Negative.   All other systems reviewed and are negative.   Objective:   Objective  BP 110/60 (BP Location: Left Arm, Patient Position: Sitting, Cuff Size: Normal)   Pulse (!) 114   Temp 99.6 F (37.6 C) (Oral)   Resp 16   Ht 5\' 3"  (1.6 m)   Wt 121 lb (54.9 kg)   SpO2 100%   BMI 21.43 kg/m   Wt Readings from Last 3 Encounters:  01/03/18 121 lb (54.9 kg)  11/29/17 125 lb 6.4 oz (56.9 kg)  11/15/17 119 lb (54 kg)    Patient refused physical examination today   Assessment/Plan:   Assessment   Encounter Diagnosis  Name Primary?  . Sickle cell nephropathy with crisis ([place]) Yes     Plan  1. Sickle cell nephropathy with crisis (HCC) - Urinalysis Dipstick - gabapentin (NEURONTIN) 300 MG capsule; Take 1 capsule (300 mg total) by mouth 3 (three) times daily.  Dispense: 90 capsule; Refill: 3 - acetaminophen-codeine (TYLENOL #3) 300-30 MG tablet; Take 1-2 tablets by mouth every 4 (four) hours as needed for up to 5 days for moderate pain.  Dispense: 60 tablet; Refill: 0 - [id number] 11+Oxyco+Alc+Crt-Bund  Patient advised to go to the emergency department for further evaluation of low-grade fever, body aches, and pain.  Patient refused emergent treatment in the emergency department due to fear of being hospitalized and having to work in the morning.  Offered patient to increase his gabapentin from 100 mg to 300 mg 3 times a day.  He states that gabapentin does not work  for him and he is not going to take it.  Offered patient Tylenol with codeine.  Patient states "I was on that in the past and it did not work then and it's not going to work now."  Patient accused this provider of tampering with his urine sample. Patient left the exam room. He spoke with the practice administrator, Jena Gauss, at the front desk and asked to speak with this provider again.  Ruthann Cancer accompanied this provider to an exam room.  Patient inquired  about his previous urine drug screens.  He became upset with having Ruthann Cancer in the room and asked about his presence.  This provider stated that Ruthann Cancer was in the room as a witness.  Patient became irate, stated, "I wanted to talk to you by yourself, but ok"  and left.  Patient refused to sign AMA and left without further incident.   His medication was sent to the pharmacy on file.  Ms. Doug Sou. Nathaneil Canary, FNP-BC  Patient [facility] [address] [place], [place] [address] [phone]  This note has been created with Dragon speech recognition software and smart phrase technology. Any transcriptional errors are unintentional.

## 2018-01-04 ENCOUNTER — Other Ambulatory Visit: Payer: Self-pay | Admitting: Family Medicine

## 2018-01-10 LAB — DRUG SCREEN 764883 11+OXYCO+ALC+CRT-BUND
Amphetamines, Urine: NEGATIVE ng/mL
BENZODIAZ UR QL: NEGATIVE ng/mL
Barbiturate: NEGATIVE ng/mL
Cannabinoid Quant, Ur: NEGATIVE ng/mL
Cocaine (Metabolite): NEGATIVE ng/mL
Creatinine: 84.8 mg/dL (ref 20.0–300.0)
Ethanol: NEGATIVE %
Meperidine: NEGATIVE ng/mL
Methadone Screen, Urine: NEGATIVE ng/mL
Phencyclidine: NEGATIVE ng/mL
Propoxyphene: NEGATIVE ng/mL
Tramadol: NEGATIVE ng/mL
pH, Urine: 7.5 (ref 4.5–8.9)

## 2018-01-10 LAB — OXYCODONE/OXYMORPHONE, CONFIRM
OXYCODONE/OXYMORPH: POSITIVE — AB
OXYCODONE: >3000 ng/mL
OXYCODONE: POSITIVE — AB
OXYMORPHONE: NEGATIVE

## 2018-01-10 LAB — OPIATES CONFIRMATION, URINE: Opiates: NEGATIVE ng/mL

## 2018-01-17 ENCOUNTER — Other Ambulatory Visit: Payer: Self-pay

## 2018-01-17 DIAGNOSIS — D5709 Hb-ss disease with crisis with other specified complication: Secondary | ICD-10-CM

## 2018-01-17 DIAGNOSIS — D5700 Hb-SS disease with crisis, unspecified: Principal | ICD-10-CM

## 2018-01-17 DIAGNOSIS — N08 Glomerular disorders in diseases classified elsewhere: Secondary | ICD-10-CM

## 2018-01-17 MED ORDER — GABAPENTIN 300 MG PO CAPS: 300.0000 mg | ORAL_CAPSULE | Freq: Three times a day (TID) | ORAL | 3 refills | Status: DC

## 2018-01-21 ENCOUNTER — Other Ambulatory Visit: Payer: Self-pay

## 2018-01-21 ENCOUNTER — Emergency Department (HOSPITAL_COMMUNITY): Payer: No Typology Code available for payment source

## 2018-01-21 ENCOUNTER — Inpatient Hospital Stay (HOSPITAL_COMMUNITY)
Admission: EM | Admit: 2018-01-21 | Discharge: 2018-01-23 | DRG: 812 | Disposition: A | Discharge status: Home / Self Care | Payer: No Typology Code available for payment source | Attending: Internal Medicine | Admitting: Internal Medicine

## 2018-01-21 ENCOUNTER — Encounter (HOSPITAL_COMMUNITY): Payer: Self-pay

## 2018-01-21 DIAGNOSIS — N08 Glomerular disorders in diseases classified elsewhere: Secondary | ICD-10-CM

## 2018-01-21 DIAGNOSIS — Z79891 Long term (current) use of opiate analgesic: Secondary | ICD-10-CM

## 2018-01-21 DIAGNOSIS — N059 Unspecified nephritic syndrome with unspecified morphologic changes: Secondary | ICD-10-CM | POA: Diagnosis present

## 2018-01-21 DIAGNOSIS — D571 Sickle-cell disease without crisis: Secondary | ICD-10-CM | POA: Diagnosis present

## 2018-01-21 DIAGNOSIS — Y92410 Unspecified street and highway as the place of occurrence of the external cause: Secondary | ICD-10-CM | POA: Diagnosis not present

## 2018-01-21 DIAGNOSIS — Z886 Allergy status to analgesic agent status: Secondary | ICD-10-CM

## 2018-01-21 DIAGNOSIS — M545 Low back pain: Secondary | ICD-10-CM | POA: Diagnosis not present

## 2018-01-21 DIAGNOSIS — N183 Chronic kidney disease, stage 3 (moderate): Secondary | ICD-10-CM | POA: Diagnosis present

## 2018-01-21 DIAGNOSIS — Z885 Allergy status to narcotic agent status: Secondary | ICD-10-CM

## 2018-01-21 DIAGNOSIS — D5700 Hb-SS disease with crisis, unspecified: Secondary | ICD-10-CM | POA: Diagnosis not present

## 2018-01-21 DIAGNOSIS — D5709 Hb-ss disease with crisis with other specified complication: Secondary | ICD-10-CM

## 2018-01-21 DIAGNOSIS — Z79899 Other long term (current) drug therapy: Secondary | ICD-10-CM

## 2018-01-21 DIAGNOSIS — S299XXA Unspecified injury of thorax, initial encounter: Secondary | ICD-10-CM | POA: Diagnosis not present

## 2018-01-21 DIAGNOSIS — D57219 Sickle-cell/Hb-C disease with crisis, unspecified: Secondary | ICD-10-CM | POA: Diagnosis not present

## 2018-01-21 DIAGNOSIS — Z9049 Acquired absence of other specified parts of digestive tract: Secondary | ICD-10-CM

## 2018-01-21 DIAGNOSIS — G894 Chronic pain syndrome: Secondary | ICD-10-CM | POA: Diagnosis present

## 2018-01-21 DIAGNOSIS — Z8701 Personal history of pneumonia (recurrent): Secondary | ICD-10-CM

## 2018-01-21 DIAGNOSIS — R55 Syncope and collapse: Secondary | ICD-10-CM | POA: Diagnosis not present

## 2018-01-21 DIAGNOSIS — R079 Chest pain, unspecified: Secondary | ICD-10-CM | POA: Diagnosis not present

## 2018-01-21 DIAGNOSIS — F17210 Nicotine dependence, cigarettes, uncomplicated: Secondary | ICD-10-CM | POA: Diagnosis present

## 2018-01-21 DIAGNOSIS — N62 Hypertrophy of breast: Secondary | ICD-10-CM | POA: Diagnosis present

## 2018-01-21 DIAGNOSIS — G930 Cerebral cysts: Secondary | ICD-10-CM | POA: Diagnosis present

## 2018-01-21 DIAGNOSIS — K219 Gastro-esophageal reflux disease without esophagitis: Secondary | ICD-10-CM | POA: Diagnosis present

## 2018-01-21 DIAGNOSIS — F1410 Cocaine abuse, uncomplicated: Secondary | ICD-10-CM | POA: Diagnosis present

## 2018-01-21 DIAGNOSIS — S3992XA Unspecified injury of lower back, initial encounter: Secondary | ICD-10-CM | POA: Diagnosis not present

## 2018-01-21 LAB — INFLUENZA PANEL BY PCR (TYPE A & B)
INFLBPCR: NEGATIVE
Influenza A By PCR: NEGATIVE

## 2018-01-21 LAB — COMPREHENSIVE METABOLIC PANEL
ALT: 50 U/L — ABNORMAL HIGH (ref 0–44)
AST: 79 U/L — ABNORMAL HIGH (ref 15–41)
Albumin: 3.1 g/dL — ABNORMAL LOW (ref 3.5–5.0)
Alkaline Phosphatase: 214 U/L — ABNORMAL HIGH (ref 38–126)
Anion gap: 8 (ref 5–15)
BILIRUBIN TOTAL: 1.5 mg/dL — AB (ref 0.3–1.2)
BUN: 22 mg/dL — ABNORMAL HIGH (ref 6–20)
CO2: 18 mmol/L — ABNORMAL LOW (ref 22–32)
Calcium: 8.2 mg/dL — ABNORMAL LOW (ref 8.9–10.3)
Chloride: 110 mmol/L (ref 98–111)
Creatinine, Ser: 2.44 mg/dL — ABNORMAL HIGH (ref 0.61–1.24)
GFR calc Af Amer: 38 mL/min — ABNORMAL LOW (ref >60)
GFR calc non Af Amer: 33 mL/min — ABNORMAL LOW (ref >60)
Glucose, Bld: 96 mg/dL (ref 70–99)
POTASSIUM: 5.2 mmol/L — AB (ref 3.5–5.1)
Sodium: 136 mmol/L (ref 135–145)
TOTAL PROTEIN: 6.5 g/dL (ref 6.5–8.1)

## 2018-01-21 LAB — CREATININE, SERUM
Creatinine, Ser: 2.54 mg/dL — ABNORMAL HIGH (ref 0.61–1.24)
GFR calc Af Amer: 36 mL/min — ABNORMAL LOW (ref >60)
GFR, EST NON AFRICAN AMERICAN: 31 mL/min — AB (ref >60)

## 2018-01-21 LAB — CBC WITH DIFFERENTIAL/PLATELET
Abs Immature Granulocytes: 0.03 10*3/uL (ref 0.00–0.07)
Basophils Absolute: 0 10*3/uL (ref 0.0–0.1)
Basophils Relative: 0 %
Eosinophils Absolute: 0 10*3/uL (ref 0.0–0.5)
Eosinophils Relative: 1 %
HCT: 21.3 % — ABNORMAL LOW (ref 39.0–52.0)
Hemoglobin: 7.5 g/dL — ABNORMAL LOW (ref 13.0–17.0)
Immature Granulocytes: 0 %
Lymphocytes Relative: 45 %
Lymphs Abs: 3.4 10*3/uL (ref 0.7–4.0)
MCH: 51.4 pg — ABNORMAL HIGH (ref 26.0–34.0)
MCHC: 35.2 g/dL (ref 30.0–36.0)
MCV: 145.9 fL — ABNORMAL HIGH (ref 80.0–100.0)
Monocytes Absolute: 0.5 10*3/uL (ref 0.1–1.0)
Monocytes Relative: 7 %
NEUTROS ABS: 3.6 10*3/uL (ref 1.7–7.7)
Neutrophils Relative %: 47 %
PLATELETS: 291 10*3/uL (ref 150–400)
RBC: 1.46 MIL/uL — AB (ref 4.22–5.81)
RDW: 16.4 % — ABNORMAL HIGH (ref 11.5–15.5)
WBC: 7.6 10*3/uL (ref 4.0–10.5)
nRBC: 4.5 % — ABNORMAL HIGH (ref 0.0–0.2)

## 2018-01-21 LAB — RAPID URINE DRUG SCREEN, HOSP PERFORMED
Amphetamines: NOT DETECTED
Barbiturates: NOT DETECTED
Benzodiazepines: NOT DETECTED
Cocaine: POSITIVE — AB
Opiates: POSITIVE — AB
Tetrahydrocannabinol: POSITIVE — AB

## 2018-01-21 LAB — RETICULOCYTES
Immature Retic Fract: 35.3 % — ABNORMAL HIGH (ref 2.3–15.9)
RBC.: 1.46 MIL/uL — ABNORMAL LOW (ref 4.22–5.81)
Retic Count, Absolute: 127.6 10*3/uL (ref 19.0–186.0)
Retic Ct Pct: 8.7 % — ABNORMAL HIGH (ref 0.4–3.1)

## 2018-01-21 LAB — I-STAT TROPONIN, ED
TROPONIN I, POC: 0.06 ng/mL (ref 0.00–0.08)
Troponin i, poc: 0.06 ng/mL (ref 0.00–0.08)

## 2018-01-21 MED ORDER — ONDANSETRON HCL 4 MG/2ML IJ SOLN: 4.0000 mg | Freq: Four times a day (QID) | INTRAMUSCULAR | Status: DC | PRN

## 2018-01-21 MED ORDER — HYDROXYUREA 500 MG PO CAPS
1500.0000 mg | ORAL_CAPSULE | Freq: Every day | ORAL | Status: DC
  Administered 2018-01-22 – 2018-01-23 (×2): 1500 mg via ORAL
  Filled 2018-01-21 (×3): qty 3

## 2018-01-21 MED ORDER — HYDROMORPHONE 1 MG/ML IV SOLN
INTRAVENOUS | Status: DC
  Administered 2018-01-21: 22:00:00 via INTRAVENOUS
  Administered 2018-01-22: 2.9 mg via INTRAVENOUS
  Administered 2018-01-22: 0.6 mg via INTRAVENOUS
  Administered 2018-01-22: 1.8 mg via INTRAVENOUS
  Administered 2018-01-22: 2.7 mg via INTRAVENOUS
  Administered 2018-01-22: 23:00:00 via INTRAVENOUS
  Administered 2018-01-22: 6.2 mg via INTRAVENOUS
  Administered 2018-01-23: 1.2 mg via INTRAVENOUS
  Administered 2018-01-23: 3.9 mg via INTRAVENOUS
  Filled 2018-01-21 (×2): qty 25

## 2018-01-21 MED ORDER — PANTOPRAZOLE SODIUM 40 MG IV SOLR
40.0000 mg | Freq: Every day | INTRAVENOUS | Status: DC
  Administered 2018-01-22: 40 mg via INTRAVENOUS
  Filled 2018-01-21 (×2): qty 40

## 2018-01-21 MED ORDER — FOLIC ACID 1 MG PO TABS
2.0000 mg | ORAL_TABLET | Freq: Every day | ORAL | Status: DC
  Administered 2018-01-21 – 2018-01-22 (×2): 2 mg via ORAL
  Filled 2018-01-21 (×2): qty 2

## 2018-01-21 MED ORDER — PANTOPRAZOLE SODIUM 40 MG PO TBEC
40.0000 mg | DELAYED_RELEASE_TABLET | Freq: Every day | ORAL | Status: DC
  Administered 2018-01-23: 40 mg via ORAL
  Filled 2018-01-21 (×2): qty 1

## 2018-01-21 MED ORDER — SENNOSIDES-DOCUSATE SODIUM 8.6-50 MG PO TABS
1.0000 | ORAL_TABLET | Freq: Two times a day (BID) | ORAL | Status: DC
  Filled 2018-01-21 (×3): qty 1

## 2018-01-21 MED ORDER — ENOXAPARIN SODIUM 40 MG/0.4ML ~~LOC~~ SOLN
40.0000 mg | SUBCUTANEOUS | Status: DC
  Administered 2018-01-21: 40 mg via SUBCUTANEOUS
  Filled 2018-01-21 (×3): qty 0.4

## 2018-01-21 MED ORDER — SODIUM CHLORIDE 0.45 % IV SOLN
INTRAVENOUS | Status: DC
  Administered 2018-01-21: 14:00:00 via INTRAVENOUS

## 2018-01-21 MED ORDER — HYDROMORPHONE HCL 1 MG/ML IJ SOLN
0.5000 mg | INTRAMUSCULAR | Status: AC
  Administered 2018-01-21: 0.5 mg via INTRAVENOUS
  Filled 2018-01-21: qty 1

## 2018-01-21 MED ORDER — NALOXONE HCL 0.4 MG/ML IJ SOLN: 0.4000 mg | INTRAMUSCULAR | Status: DC | PRN

## 2018-01-21 MED ORDER — HYDROMORPHONE HCL 1 MG/ML IJ SOLN
1.0000 mg | INTRAMUSCULAR | Status: AC
  Administered 2018-01-21: 1 mg via INTRAVENOUS
  Filled 2018-01-21: qty 1

## 2018-01-21 MED ORDER — NORTRIPTYLINE HCL 25 MG PO CAPS
25.0000 mg | ORAL_CAPSULE | Freq: Every day | ORAL | Status: DC
  Filled 2018-01-21 (×2): qty 1

## 2018-01-21 MED ORDER — HYDROMORPHONE HCL 1 MG/ML IJ SOLN: 0.5000 mg | INTRAMUSCULAR | Status: AC

## 2018-01-21 MED ORDER — SODIUM CHLORIDE 0.45 % IV SOLN
INTRAVENOUS | Status: DC
  Administered 2018-01-21 – 2018-01-23 (×3): via INTRAVENOUS

## 2018-01-21 MED ORDER — GABAPENTIN 300 MG PO CAPS: 300.0000 mg | ORAL_CAPSULE | Freq: Three times a day (TID) | ORAL | 3 refills | Status: DC

## 2018-01-21 MED ORDER — DIPHENHYDRAMINE HCL 12.5 MG/5ML PO ELIX: 12.5000 mg | ORAL_SOLUTION | Freq: Four times a day (QID) | ORAL | Status: DC | PRN

## 2018-01-21 MED ORDER — DIPHENHYDRAMINE HCL 50 MG/ML IJ SOLN
25.0000 mg | Freq: Once | INTRAMUSCULAR | Status: DC
  Filled 2018-01-21: qty 1

## 2018-01-21 MED ORDER — POLYETHYLENE GLYCOL 3350 17 G PO PACK: 17.0000 g | PACK | Freq: Every day | ORAL | Status: DC | PRN

## 2018-01-21 MED ORDER — HYDROMORPHONE HCL 1 MG/ML IJ SOLN
1.0000 mg | Freq: Once | INTRAMUSCULAR | Status: AC
  Administered 2018-01-21: 1 mg via INTRAVENOUS
  Filled 2018-01-21: qty 1

## 2018-01-21 MED ORDER — DIPHENHYDRAMINE HCL 50 MG/ML IJ SOLN
12.5000 mg | Freq: Four times a day (QID) | INTRAMUSCULAR | Status: DC | PRN
  Administered 2018-01-21 – 2018-01-23 (×4): 12.5 mg via INTRAVENOUS
  Filled 2018-01-21 (×4): qty 1

## 2018-01-21 MED ORDER — HYDROMORPHONE HCL 1 MG/ML IJ SOLN: 1.0000 mg | INTRAMUSCULAR | Status: AC

## 2018-01-21 MED ORDER — SODIUM CHLORIDE 0.9% FLUSH: 9.0000 mL | INTRAVENOUS | Status: DC | PRN

## 2018-01-21 MED ORDER — VITAMIN D (ERGOCALCIFEROL) 1.25 MG (50000 UNIT) PO CAPS
50000.0000 [IU] | ORAL_CAPSULE | ORAL | Status: DC
  Administered 2018-01-23: 50000 [IU] via ORAL
  Filled 2018-01-21: qty 1

## 2018-01-21 NOTE — ED Notes (Signed)
Patient requesting to eat, RN messaged PA at this time to see if pt can eat
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
## 2018-01-21 NOTE — ED Notes (Signed)
Pt reports he does not usually need benadryl as dilaudid does not make him itch. RN held dilaudid and will administer if pt feels like he needs it.

## 2018-01-21 NOTE — ED Notes (Signed)
Pt is refusing rectal temp.  

## 2018-01-21 NOTE — ED Notes (Signed)
Attempted to draw I stat troponin to left hand unsuccessful.

## 2018-01-22 ENCOUNTER — Inpatient Hospital Stay (HOSPITAL_COMMUNITY): Payer: No Typology Code available for payment source

## 2018-01-22 MED ORDER — BOOST / RESOURCE BREEZE PO LIQD CUSTOM
1.0000 | Freq: Three times a day (TID) | ORAL | Status: DC
  Administered 2018-01-23: 1 via ORAL

## 2018-01-22 MED ORDER — FOLIC ACID 1 MG PO TABS
1.0000 mg | ORAL_TABLET | Freq: Every day | ORAL | Status: DC
  Administered 2018-01-23: 1 mg via ORAL
  Filled 2018-01-22: qty 1

## 2018-01-22 MED ORDER — ADULT MULTIVITAMIN W/MINERALS CH
1.0000 | ORAL_TABLET | Freq: Every day | ORAL | Status: DC
  Administered 2018-01-23: 1 via ORAL
  Filled 2018-01-22: qty 1
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
## 2018-01-22 NOTE — Progress Notes (Signed)
Assumed care of patient around noon. I've reviewed the previous RN's charting and assessment and agree with what has been charted. Will continue to monitor patient.  Timoteo Gaul, RN

## 2018-01-22 NOTE — Plan of Care (Signed)
Pt had a good appetite this shift.
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
## 2018-01-22 NOTE — Progress Notes (Signed)
Pt care taken over at shift change 1900, pt IV not patent, RN on first shift placed order for IV team consult. IV team nurse in room while pt heard raising voice, yelling, cussing very loudly heard down the hall. Pt was on phone but pt did question the IV nurse regarding her ability to perform- this RN heard this outside the room. IV team RN stated she will be back to try again. Pt has no IV at this time, pt refused this RN to try. Will check back with IV team.

## 2018-01-23 ENCOUNTER — Inpatient Hospital Stay (HOSPITAL_COMMUNITY): Payer: No Typology Code available for payment source

## 2018-01-23 DIAGNOSIS — N08 Glomerular disorders in diseases classified elsewhere: Secondary | ICD-10-CM

## 2018-01-23 LAB — BASIC METABOLIC PANEL
ANION GAP: 7 (ref 5–15)
BUN: 23 mg/dL — ABNORMAL HIGH (ref 6–20)
CO2: 19 mmol/L — ABNORMAL LOW (ref 22–32)
Calcium: 8.5 mg/dL — ABNORMAL LOW (ref 8.9–10.3)
Chloride: 110 mmol/L (ref 98–111)
Creatinine, Ser: 2.22 mg/dL — ABNORMAL HIGH (ref 0.61–1.24)
GFR calc Af Amer: 43 mL/min — ABNORMAL LOW (ref >60)
GFR calc non Af Amer: 37 mL/min — ABNORMAL LOW (ref >60)
Glucose, Bld: 83 mg/dL (ref 70–99)
POTASSIUM: 4.9 mmol/L (ref 3.5–5.1)
Sodium: 136 mmol/L (ref 135–145)

## 2018-01-23 LAB — CBC
HCT: 18.9 % — ABNORMAL LOW (ref 39.0–52.0)
Hemoglobin: 7.1 g/dL — ABNORMAL LOW (ref 13.0–17.0)
MCH: 49.3 pg — AB (ref 26.0–34.0)
MCHC: 35.4 g/dL (ref 30.0–36.0)
MCV: 139 fL — ABNORMAL HIGH (ref 80.0–100.0)
NRBC: 2.1 % — AB (ref 0.0–0.2)
PLATELETS: 206 10*3/uL (ref 150–400)
RBC: 1.36 MIL/uL — ABNORMAL LOW (ref 4.22–5.81)
RDW: 16.4 % — AB (ref 11.5–15.5)
WBC: 8.8 10*3/uL (ref 4.0–10.5)

## 2018-01-23 MED ORDER — OXYCODONE HCL 10 MG PO TABS: 10.0000 mg | ORAL_TABLET | Freq: Four times a day (QID) | ORAL | 0 refills | Status: DC | PRN

## 2018-01-23 MED ORDER — OXYCODONE HCL 5 MG PO TABS
5.0000 mg | ORAL_TABLET | Freq: Four times a day (QID) | ORAL | Status: DC | PRN
  Administered 2018-01-23: 5 mg via ORAL
  Filled 2018-01-23: qty 1

## 2018-01-23 MED ORDER — DIPHENHYDRAMINE HCL 12.5 MG/5ML PO ELIX
12.5000 mg | ORAL_SOLUTION | Freq: Once | ORAL | Status: DC
  Filled 2018-01-23: qty 5
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
## 2018-01-23 NOTE — Progress Notes (Signed)
After IV insertion and diaulidid PCA pump unlocked and realized 14mg  left of diaulidid in pump, wasted with Joeanne unable to waste in the pyxis.

## 2018-01-23 NOTE — Progress Notes (Signed)
Pt rude, demeaning, verbally abusive towards staff (this RN) Pt made statements regarding nursing care provided, he questioned when this RN diluted benadryl with saline before administrating it via IV, "you sure you know what you're doing, I never had anyone else do that." Pt requested benadryl, explained he is not due for it, pt became very agitated and wanted the benadryl anyway, told pt this RN can page for extra dose, pt stated "what are you waiting for" pt told me to flush his IV tubing before administrating IV benadryl because he was "laying on the IV" pt denied pain, burning at the site, IV fluids running so explained pt no need for flushing prior the medicine, pt stated "I'm telling you what you need to do" questioning my nursing ability, advised pt I was the RN, trained and knew what I was doing pt went on to say " you sure as hell don't act like it" also stated I needed to grab his benadryl and come straight back to his room. Offered lotion for patient r/t scratching pt stated "do I look I need lotion?" Pt was sexually nonprofit asking if I was "happily married" after noticing ring and continued to offer to take me out and stated "I really need to get some pussy" this RN tried setting limits, reminded pt not appropriate, and redirected conversation.

## 2018-01-23 NOTE — Progress Notes (Signed)
Wasted 31ml Dilaudid PCA syringe. Witnessed by [facility].Stacey Drain

## 2018-01-23 NOTE — Discharge Instructions (Signed)
Resume all home medications All lab values are consistent with baseline as discussed.   Day hospital information:  [phone], I typically admit patients from 8-12.    [facility] [place]   Sickle Cell Anemia, Adult Sickle cell anemia is a condition where your red blood cells are shaped like sickles. Red blood cells carry oxygen through the body. Sickle-shaped red blood cells do not live as long as normal red blood cells. They also clump together and block blood from flowing through the blood vessels. These things prevent the body from getting enough oxygen. Sickle cell anemia causes organ damage and pain. It also increases the risk of infection. Follow these instructions at home:  Drink enough fluid to keep your pee (urine) clear or pale yellow. Drink more in hot weather and during exercise.  Do not smoke. Smoking lowers oxygen levels in the blood.  Only take over-the-counter or prescription medicines as told by your doctor.  Take antibiotic medicines as told by your doctor. Make sure you finish them even if you start to feel better.  Take supplements as told by your doctor.  Consider wearing a medical alert bracelet. This tells anyone caring for you in an emergency of your condition.  When traveling, keep your medical information, doctors' names, and the medicines you take with you at all times.  If you have a fever, do not take fever medicines right away. This could cover up a problem. Tell your doctor.  Keep all follow-up visits with your doctor. Sickle cell anemia requires regular medical care. Contact a doctor if: You have a fever. Get help right away if:  You feel dizzy or faint.  You have new belly (abdominal) pain, especially on the left side near the stomach area.  You have a lasting, often uncomfortable and painful erection of the penis (priapism). If it is not treated right away, you will become unable to  have sex (impotence).  You have numbness in your arms or legs or you have a hard time moving them.  You have a hard time talking.  You have a fever or lasting symptoms for more than 2-3 days.  You have a fever and your symptoms suddenly get worse.  You have signs or symptoms of infection. These include: ? Chills. ? Being more tired than normal (lethargy). ? Irritability. ? Poor eating. ? Throwing up (vomiting).  You have pain that is not helped with medicine.  You have shortness of breath.  You have pain in your chest.  You are coughing up pus-like or bloody mucus.  You have a stiff neck.  Your feet or hands swell or have pain.  Your belly looks bloated.  Your joints hurt. This information is not intended to replace advice given to you by your health care provider. Make sure you discuss any questions you have with your health care provider. Document Released: 11/20/2012 Document Revised: 07/08/2015 Document Reviewed: 09/11/2012 Elsevier Interactive Patient Education  2017 [employer].
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
## 2018-01-23 NOTE — Plan of Care (Signed)
  Problem: Health Behavior/Discharge Planning: Goal: Ability to manage health-related needs will improve Outcome: Progressing   Problem: Clinical Measurements: Goal: Ability to maintain clinical measurements within normal limits will improve Outcome: Progressing Goal: Will remain free from infection Outcome: Progressing Goal: Diagnostic test results will improve Outcome: Progressing Goal: Respiratory complications will improve Outcome: Progressing Goal: Cardiovascular complication will be avoided Outcome: Progressing   Problem: Activity: Goal: Risk for activity intolerance will decrease Outcome: Progressing   Problem: Nutrition: Goal: Adequate nutrition will be maintained Outcome: Progressing   Problem: Coping: Goal: Level of anxiety will decrease Outcome: Progressing   Problem: Pain Managment: Goal: General experience of comfort will improve Outcome: Progressing   Problem: Safety: Goal: Ability to remain free from injury will improve Outcome: Progressing   Problem: Skin Integrity: Goal: Risk for impaired skin integrity will decrease Outcome: Progressing   

## 2018-01-31 ENCOUNTER — Ambulatory Visit: Payer: Medicare Other | Admitting: Family Medicine

## 2018-02-04 ENCOUNTER — Ambulatory Visit (INDEPENDENT_AMBULATORY_CARE_PROVIDER_SITE_OTHER): Payer: Medicare Other | Admitting: Family Medicine

## 2018-02-04 VITALS — BP 113/78 | HR 112 | Temp 98.4°F | Resp 16 | Ht 63.0 in | Wt 129.0 lb

## 2018-02-04 DIAGNOSIS — D571 Sickle-cell disease without crisis: Secondary | ICD-10-CM | POA: Diagnosis not present

## 2018-02-04 LAB — POCT URINALYSIS DIPSTICK
Bilirubin, UA: NEGATIVE
Blood, UA: 6.5
Glucose, UA: NEGATIVE
Ketones, UA: NEGATIVE
Leukocytes, UA: NEGATIVE
Nitrite, UA: NEGATIVE
Protein, UA: NEGATIVE
Spec Grav, UA: 1.01 (ref 1.010–1.025)
Urobilinogen, UA: 0.2 E.U./dL
pH, UA: 6.5 (ref 5.0–8.0)

## 2018-02-04 NOTE — Patient Instructions (Signed)
Sickle Cell Anemia, Adult °Sickle cell anemia is a condition where your red blood cells are shaped like sickles. Red blood cells carry oxygen through the body. Sickle-shaped cells do not live as long as normal red blood cells. They also clump together and block blood from flowing through the blood vessels. This prevents the body from getting enough oxygen. Sickle cell anemia causes organ damage and pain. It also increases the risk of infection. °Follow these instructions at home: °Medicines °· Take over-the-counter and prescription medicines only as told by your doctor. °· If you were prescribed an antibiotic medicine, take it as told by your doctor. Do not stop taking the antibiotic even if you start to feel better. °· If you develop a fever, do not take medicines to lower the fever right away. Tell your doctor about the fever. °Managing pain, stiffness, and swelling °· Try these methods to help with pain: °? Use a heating pad. °? Take a warm bath. °? Distract yourself, such as by watching TV. °Eating and drinking °· Drink enough fluid to keep your pee (urine) clear or pale yellow. Drink more in hot weather and during exercise. °· Limit or avoid alcohol. °· Eat a healthy diet. Eat plenty of fruits, vegetables, whole grains, and lean protein. °· Take vitamins and supplements as told by your doctor. °Traveling °· When traveling, keep these with you: °? Your medical information. °? The names of your doctors. °? Your medicines. °· If you need to take an airplane, talk to your doctor first. °Activity °· Rest often. °· Avoid exercises that make your heart beat much faster, such as jogging. °General instructions °· Do not use products that have nicotine or tobacco, such as cigarettes and e-cigarettes. If you need help quitting, ask your doctor. °· Consider wearing a medical alert bracelet. °· Avoid being in high places (high altitudes), such as mountains. °· Avoid very hot or cold temperatures. °· Avoid places where the  temperature changes a lot. °· Keep all follow-up visits as told by your doctor. This is important. °Contact a doctor if: °· A joint hurts. °· Your feet or hands hurt or swell. °· You feel tired (fatigued). °Get help right away if: °· You have symptoms of infection. These include: °? Fever. °? Chills. °? Being very tired. °? Irritability. °? Poor eating. °? Throwing up (vomiting). °· You feel dizzy or faint. °· You have new stomach pain, especially on the left side. °· You have a an erection (priapism) that lasts more than 4 hours. °· You have numbness in your arms or legs. °· You have a hard time moving your arms or legs. °· You have trouble talking. °· You have pain that does not go away when you take medicine. °· You are short of breath. °· You are breathing fast. °· You have a long-term cough. °· You have pain in your chest. °· You have a bad headache. °· You have a stiff neck. °· Your stomach looks bloated even though you did not eat much. °· Your skin is pale. °· You suddenly cannot see well. °Summary °· Sickle cell anemia is a condition where your red blood cells are shaped like sickles. °· Follow your doctor's advice on ways to manage pain, food to eat, activities to do, and steps to take for safe travel. °· Get medical help right away if you have any signs of infection, such as a fever. °This information is not intended to replace advice given to you by your   health care provider. Make sure you discuss any questions you have with your health care provider. °Document Released: 11/20/2012 Document Revised: 03/07/2016 Document Reviewed: 03/07/2016 °Elsevier Interactive Patient Education © 2019 Elsevier Inc. ° °
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
## 2018-02-12 LAB — 737588 9+OXYCODONE+CRT-UNBUND
Amphetamine Scrn, Ur: NEGATIVE ng/mL
BARBITURATE SCREEN URINE: NEGATIVE ng/mL
BENZODIAZEPINE SCREEN, URINE: NEGATIVE ng/mL
CANNABINOIDS UR QL SCN: NEGATIVE ng/mL
Cocaine (Metab) Scrn, Ur: NEGATIVE ng/mL
Creatinine(Crt), U: 76.9 mg/dL (ref 20.0–300.0)
Methadone Screen, Urine: NEGATIVE ng/mL
Ph of Urine: 7.1 (ref 4.5–8.9)
Phencyclidine Qn, Ur: NEGATIVE ng/mL
Propoxyphene Scrn, Ur: NEGATIVE ng/mL

## 2018-02-12 LAB — OPIATES CONFIRMATION, URINE
Codeine Confirm: >3000 ng/mL
Codeine: POSITIVE — AB
Hydrocodone: NEGATIVE
Hydromorphone: NEGATIVE
Morphine: NEGATIVE
Opiates: POSITIVE ng/mL — AB

## 2018-02-12 LAB — OXYCODONE/OXYMORPHONE CONFIRM
OXYCODONE/OXYMORPH: POSITIVE — AB
OXYCODONE: >3000 ng/mL
OXYCODONE: POSITIVE — AB
OXYMORPHONE: NEGATIVE

## 2018-02-21 ENCOUNTER — Ambulatory Visit: Payer: Medicare Other | Admitting: Family Medicine

## 2018-02-21 ENCOUNTER — Ambulatory Visit: Payer: Medicare Other

## 2018-02-21 DIAGNOSIS — D571 Sickle-cell disease without crisis: Secondary | ICD-10-CM

## 2018-02-21 DIAGNOSIS — G894 Chronic pain syndrome: Secondary | ICD-10-CM

## 2018-02-26 LAB — DRUG SCREEN 764883 11+OXYCO+ALC+CRT-BUND
Amphetamines, Urine: NEGATIVE ng/mL
BENZODIAZ UR QL: NEGATIVE ng/mL
Barbiturate: NEGATIVE ng/mL
Cannabinoid Quant, Ur: NEGATIVE ng/mL
Cocaine (Metabolite): NEGATIVE ng/mL
Creatinine: 71.7 mg/dL (ref 20.0–300.0)
Ethanol: NEGATIVE %
Meperidine: NEGATIVE ng/mL
Methadone Screen, Urine: NEGATIVE ng/mL
Phencyclidine: NEGATIVE ng/mL
Propoxyphene: NEGATIVE ng/mL
Tramadol: NEGATIVE ng/mL
pH, Urine: 7.4 (ref 4.5–8.9)

## 2018-02-26 LAB — OPIATES CONFIRMATION, URINE
Codeine Confirm: >3000 ng/mL
Codeine: POSITIVE — AB
Hydrocodone: NEGATIVE
Hydromorphone: NEGATIVE
Morphine: NEGATIVE
Opiates: POSITIVE ng/mL — AB

## 2018-02-26 LAB — OXYCODONE/OXYMORPHONE, CONFIRM
OXYCODONE/OXYMORPH: POSITIVE — AB
OXYCODONE: >3000 ng/mL
OXYCODONE: POSITIVE — AB
OXYMORPHONE: NEGATIVE

## 2018-03-06 ENCOUNTER — Encounter (HOSPITAL_COMMUNITY): Payer: Self-pay

## 2018-03-06 ENCOUNTER — Inpatient Hospital Stay (HOSPITAL_COMMUNITY)
Admission: EM | Admit: 2018-03-06 | Discharge: 2018-03-11 | DRG: 871 | Disposition: A | Discharge status: Home / Self Care | Payer: Medicare Other | Attending: Internal Medicine | Admitting: Internal Medicine

## 2018-03-06 ENCOUNTER — Telehealth: Payer: Self-pay

## 2018-03-06 ENCOUNTER — Other Ambulatory Visit: Payer: Self-pay

## 2018-03-06 ENCOUNTER — Emergency Department (HOSPITAL_COMMUNITY): Payer: Medicare Other

## 2018-03-06 DIAGNOSIS — G894 Chronic pain syndrome: Secondary | ICD-10-CM | POA: Diagnosis present

## 2018-03-06 DIAGNOSIS — N62 Hypertrophy of breast: Secondary | ICD-10-CM | POA: Diagnosis present

## 2018-03-06 DIAGNOSIS — K920 Hematemesis: Secondary | ICD-10-CM | POA: Diagnosis present

## 2018-03-06 DIAGNOSIS — D638 Anemia in other chronic diseases classified elsewhere: Secondary | ICD-10-CM | POA: Diagnosis present

## 2018-03-06 DIAGNOSIS — Z885 Allergy status to narcotic agent status: Secondary | ICD-10-CM

## 2018-03-06 DIAGNOSIS — N183 Chronic kidney disease, stage 3 unspecified: Secondary | ICD-10-CM | POA: Diagnosis present

## 2018-03-06 DIAGNOSIS — K219 Gastro-esophageal reflux disease without esophagitis: Secondary | ICD-10-CM | POA: Diagnosis present

## 2018-03-06 DIAGNOSIS — A419 Sepsis, unspecified organism: Secondary | ICD-10-CM | POA: Diagnosis not present

## 2018-03-06 DIAGNOSIS — D5700 Hb-SS disease with crisis, unspecified: Secondary | ICD-10-CM

## 2018-03-06 DIAGNOSIS — R079 Chest pain, unspecified: Secondary | ICD-10-CM | POA: Diagnosis present

## 2018-03-06 DIAGNOSIS — Z79899 Other long term (current) drug therapy: Secondary | ICD-10-CM

## 2018-03-06 DIAGNOSIS — R05 Cough: Secondary | ICD-10-CM | POA: Diagnosis not present

## 2018-03-06 DIAGNOSIS — Z79891 Long term (current) use of opiate analgesic: Secondary | ICD-10-CM

## 2018-03-06 DIAGNOSIS — F17200 Nicotine dependence, unspecified, uncomplicated: Secondary | ICD-10-CM | POA: Diagnosis present

## 2018-03-06 DIAGNOSIS — F1490 Cocaine use, unspecified, uncomplicated: Secondary | ICD-10-CM | POA: Diagnosis present

## 2018-03-06 DIAGNOSIS — J101 Influenza due to other identified influenza virus with other respiratory manifestations: Secondary | ICD-10-CM | POA: Diagnosis present

## 2018-03-06 DIAGNOSIS — F17210 Nicotine dependence, cigarettes, uncomplicated: Secondary | ICD-10-CM | POA: Diagnosis present

## 2018-03-06 DIAGNOSIS — D571 Sickle-cell disease without crisis: Secondary | ICD-10-CM | POA: Diagnosis present

## 2018-03-06 DIAGNOSIS — R000 Tachycardia, unspecified: Secondary | ICD-10-CM | POA: Diagnosis present

## 2018-03-06 LAB — COMPREHENSIVE METABOLIC PANEL
ALT: 117 U/L — ABNORMAL HIGH (ref 0–44)
AST: 202 U/L — ABNORMAL HIGH (ref 15–41)
Albumin: 3.1 g/dL — ABNORMAL LOW (ref 3.5–5.0)
Alkaline Phosphatase: 188 U/L — ABNORMAL HIGH (ref 38–126)
Anion gap: 7 (ref 5–15)
BUN: 23 mg/dL — ABNORMAL HIGH (ref 6–20)
CO2: 17 mmol/L — ABNORMAL LOW (ref 22–32)
Calcium: 8.3 mg/dL — ABNORMAL LOW (ref 8.9–10.3)
Chloride: 111 mmol/L (ref 98–111)
Creatinine, Ser: 2.43 mg/dL — ABNORMAL HIGH (ref 0.61–1.24)
GFR calc Af Amer: 38 mL/min — ABNORMAL LOW (ref >60)
GFR calc non Af Amer: 33 mL/min — ABNORMAL LOW (ref >60)
Glucose, Bld: 95 mg/dL (ref 70–99)
Potassium: 4.1 mmol/L (ref 3.5–5.1)
SODIUM: 135 mmol/L (ref 135–145)
Total Bilirubin: 1.4 mg/dL — ABNORMAL HIGH (ref 0.3–1.2)
Total Protein: 6.4 g/dL — ABNORMAL LOW (ref 6.5–8.1)

## 2018-03-06 LAB — URINALYSIS, ROUTINE W REFLEX MICROSCOPIC
Bacteria, UA: NONE SEEN
Bilirubin Urine: NEGATIVE
Glucose, UA: 50 mg/dL — AB
KETONES UR: NEGATIVE mg/dL
Leukocytes, UA: NEGATIVE
Nitrite: NEGATIVE
PH: 6 (ref 5.0–8.0)
Protein, ur: >=300 mg/dL — AB
Specific Gravity, Urine: 1.013 (ref 1.005–1.030)

## 2018-03-06 LAB — CBC WITH DIFFERENTIAL/PLATELET
Abs Immature Granulocytes: 0.05 10*3/uL (ref 0.00–0.07)
Basophils Absolute: 0 10*3/uL (ref 0.0–0.1)
Basophils Relative: 0 %
EOS ABS: 0.1 10*3/uL (ref 0.0–0.5)
Eosinophils Relative: 2 %
HCT: 22.9 % — ABNORMAL LOW (ref 39.0–52.0)
Hemoglobin: 7.8 g/dL — ABNORMAL LOW (ref 13.0–17.0)
Immature Granulocytes: 1 %
LYMPHS ABS: 3.2 10*3/uL (ref 0.7–4.0)
Lymphocytes Relative: 42 %
MCH: 49.7 pg — ABNORMAL HIGH (ref 26.0–34.0)
MCHC: 34.1 g/dL (ref 30.0–36.0)
MCV: 145.9 fL — ABNORMAL HIGH (ref 80.0–100.0)
Monocytes Absolute: 0.7 10*3/uL (ref 0.1–1.0)
Monocytes Relative: 9 %
Neutro Abs: 3.6 10*3/uL (ref 1.7–7.7)
Neutrophils Relative %: 46 %
Platelets: 214 10*3/uL (ref 150–400)
RBC: 1.57 MIL/uL — ABNORMAL LOW (ref 4.22–5.81)
RDW: 16.1 % — ABNORMAL HIGH (ref 11.5–15.5)
WBC: 7.4 10*3/uL (ref 4.0–10.5)
nRBC: 1.1 % — ABNORMAL HIGH (ref 0.0–0.2)

## 2018-03-06 LAB — RAPID URINE DRUG SCREEN, HOSP PERFORMED
Amphetamines: NOT DETECTED
BENZODIAZEPINES: NOT DETECTED
Barbiturates: NOT DETECTED
Cocaine: NOT DETECTED
Opiates: NOT DETECTED
Tetrahydrocannabinol: POSITIVE — AB

## 2018-03-06 LAB — RETICULOCYTES
Immature Retic Fract: 8.7 % (ref 2.3–15.9)
RBC.: 1.57 MIL/uL — ABNORMAL LOW (ref 4.22–5.81)
Retic Count, Absolute: 127.6 10*3/uL (ref 19.0–186.0)
Retic Ct Pct: 8.2 % — ABNORMAL HIGH (ref 0.4–3.1)

## 2018-03-06 MED ORDER — HYDROMORPHONE HCL 1 MG/ML IJ SOLN
1.0000 mg | INTRAMUSCULAR | Status: AC
  Administered 2018-03-06: 1 mg via SUBCUTANEOUS
  Filled 2018-03-06: qty 1

## 2018-03-06 MED ORDER — HYDROMORPHONE HCL 1 MG/ML IJ SOLN: 1.0000 mg | INTRAMUSCULAR | Status: AC

## 2018-03-06 MED ORDER — DIPHENHYDRAMINE HCL 25 MG PO CAPS
25.0000 mg | ORAL_CAPSULE | ORAL | Status: DC | PRN
  Administered 2018-03-07: 25 mg via ORAL
  Filled 2018-03-06: qty 1

## 2018-03-06 MED ORDER — HYDROMORPHONE HCL 1 MG/ML IJ SOLN
1.0000 mg | INTRAMUSCULAR | Status: AC
  Filled 2018-03-06: qty 1

## 2018-03-06 MED ORDER — HYDROMORPHONE HCL 1 MG/ML IJ SOLN
1.0000 mg | INTRAMUSCULAR | Status: AC
  Administered 2018-03-06: 1 mg via SUBCUTANEOUS

## 2018-03-06 MED ORDER — HYDROMORPHONE HCL 1 MG/ML IJ SOLN: 0.5000 mg | INTRAMUSCULAR | Status: AC

## 2018-03-06 MED ORDER — DEXTROSE-NACL 5-0.45 % IV SOLN
INTRAVENOUS | Status: DC
  Administered 2018-03-06: 23:00:00 via INTRAVENOUS

## 2018-03-06 MED ORDER — KETOROLAC TROMETHAMINE 15 MG/ML IJ SOLN
15.0000 mg | INTRAMUSCULAR | Status: AC
  Administered 2018-03-06: 15 mg via INTRAVENOUS
  Filled 2018-03-06: qty 1

## 2018-03-06 MED ORDER — HYDROMORPHONE HCL 1 MG/ML IJ SOLN
0.5000 mg | INTRAMUSCULAR | Status: AC
  Administered 2018-03-06: 0.5 mg via SUBCUTANEOUS
  Filled 2018-03-06: qty 1

## 2018-03-06 NOTE — ED Notes (Signed)
Patient request lab draw with an IV start.
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
## 2018-03-06 NOTE — ED Triage Notes (Signed)
Patient arrived via POV. Patient AOx4 and ambulatory. Patient chief complaint is Sickle cell pain in bilateral legs and chest. Patient states patient was throwing up blood last night.

## 2018-03-07 ENCOUNTER — Other Ambulatory Visit: Payer: Self-pay

## 2018-03-07 ENCOUNTER — Ambulatory Visit: Payer: Medicare Other | Admitting: Family Medicine

## 2018-03-07 DIAGNOSIS — N62 Hypertrophy of breast: Secondary | ICD-10-CM | POA: Diagnosis present

## 2018-03-07 DIAGNOSIS — N183 Chronic kidney disease, stage 3 unspecified: Secondary | ICD-10-CM | POA: Diagnosis present

## 2018-03-07 DIAGNOSIS — J101 Influenza due to other identified influenza virus with other respiratory manifestations: Secondary | ICD-10-CM | POA: Diagnosis not present

## 2018-03-07 DIAGNOSIS — D571 Sickle-cell disease without crisis: Secondary | ICD-10-CM | POA: Diagnosis not present

## 2018-03-07 DIAGNOSIS — G894 Chronic pain syndrome: Secondary | ICD-10-CM | POA: Diagnosis present

## 2018-03-07 DIAGNOSIS — R079 Chest pain, unspecified: Secondary | ICD-10-CM | POA: Diagnosis not present

## 2018-03-07 DIAGNOSIS — Z885 Allergy status to narcotic agent status: Secondary | ICD-10-CM | POA: Diagnosis not present

## 2018-03-07 DIAGNOSIS — K920 Hematemesis: Secondary | ICD-10-CM

## 2018-03-07 DIAGNOSIS — D638 Anemia in other chronic diseases classified elsewhere: Secondary | ICD-10-CM | POA: Diagnosis present

## 2018-03-07 DIAGNOSIS — A419 Sepsis, unspecified organism: Secondary | ICD-10-CM | POA: Diagnosis present

## 2018-03-07 DIAGNOSIS — R000 Tachycardia, unspecified: Secondary | ICD-10-CM

## 2018-03-07 DIAGNOSIS — F17200 Nicotine dependence, unspecified, uncomplicated: Secondary | ICD-10-CM

## 2018-03-07 DIAGNOSIS — K219 Gastro-esophageal reflux disease without esophagitis: Secondary | ICD-10-CM

## 2018-03-07 DIAGNOSIS — Z79891 Long term (current) use of opiate analgesic: Secondary | ICD-10-CM | POA: Diagnosis not present

## 2018-03-07 DIAGNOSIS — D5700 Hb-SS disease with crisis, unspecified: Secondary | ICD-10-CM

## 2018-03-07 DIAGNOSIS — Z79899 Other long term (current) drug therapy: Secondary | ICD-10-CM | POA: Diagnosis not present

## 2018-03-07 DIAGNOSIS — F1490 Cocaine use, unspecified, uncomplicated: Secondary | ICD-10-CM | POA: Diagnosis present

## 2018-03-07 DIAGNOSIS — F17210 Nicotine dependence, cigarettes, uncomplicated: Secondary | ICD-10-CM | POA: Diagnosis present

## 2018-03-07 LAB — CBC
HCT: 21.3 % — ABNORMAL LOW (ref 39.0–52.0)
HCT: 20.8 % — ABNORMAL LOW (ref 39.0–52.0)
HCT: 21.4 % — ABNORMAL LOW (ref 39.0–52.0)
HCT: 22.9 % — ABNORMAL LOW (ref 39.0–52.0)
Hemoglobin: 7.3 g/dL — ABNORMAL LOW (ref 13.0–17.0)
Hemoglobin: 7.1 g/dL — ABNORMAL LOW (ref 13.0–17.0)
Hemoglobin: 7.3 g/dL — ABNORMAL LOW (ref 13.0–17.0)
Hemoglobin: 8.2 g/dL — ABNORMAL LOW (ref 13.0–17.0)
MCH: 49.3 pg — ABNORMAL HIGH (ref 26.0–34.0)
MCH: 49.7 pg — ABNORMAL HIGH (ref 26.0–34.0)
MCH: 48 pg — ABNORMAL HIGH (ref 26.0–34.0)
MCH: 48.2 pg — ABNORMAL HIGH (ref 26.0–34.0)
MCHC: 34.3 g/dL (ref 30.0–36.0)
MCHC: 34.1 g/dL (ref 30.0–36.0)
MCHC: 34.1 g/dL (ref 30.0–36.0)
MCHC: 35.8 g/dL (ref 30.0–36.0)
MCV: 143.9 fL — ABNORMAL HIGH (ref 80.0–100.0)
MCV: 145.5 fL — AB (ref 80.0–100.0)
MCV: 140.8 fL — ABNORMAL HIGH (ref 80.0–100.0)
MCV: 134.7 fL — ABNORMAL HIGH (ref 80.0–100.0)
NRBC: 0.7 % — AB (ref 0.0–0.2)
Platelets: 208 10*3/uL (ref 150–400)
Platelets: 184 10*3/uL (ref 150–400)
Platelets: 212 10*3/uL (ref 150–400)
Platelets: 151 10*3/uL (ref 150–400)
RBC: 1.48 MIL/uL — ABNORMAL LOW (ref 4.22–5.81)
RBC: 1.43 MIL/uL — ABNORMAL LOW (ref 4.22–5.81)
RBC: 1.52 MIL/uL — ABNORMAL LOW (ref 4.22–5.81)
RBC: 1.7 MIL/uL — ABNORMAL LOW (ref 4.22–5.81)
RDW: 16.6 % — ABNORMAL HIGH (ref 11.5–15.5)
RDW: 17.2 % — ABNORMAL HIGH (ref 11.5–15.5)
RDW: 18.1 % — ABNORMAL HIGH (ref 11.5–15.5)
RDW: 17.2 % — ABNORMAL HIGH (ref 11.5–15.5)
WBC: 7.5 10*3/uL (ref 4.0–10.5)
WBC: 8.4 10*3/uL (ref 4.0–10.5)
WBC: 9.2 10*3/uL (ref 4.0–10.5)
WBC: 8.4 10*3/uL (ref 4.0–10.5)
nRBC: 1.1 % — ABNORMAL HIGH (ref 0.0–0.2)
nRBC: 0.7 % — ABNORMAL HIGH (ref 0.0–0.2)
nRBC: 0.9 % — ABNORMAL HIGH (ref 0.0–0.2)

## 2018-03-07 LAB — PROCALCITONIN: PROCALCITONIN: 1.98 ng/mL

## 2018-03-07 LAB — LIPID PANEL
Cholesterol: 145 mg/dL (ref 0–200)
HDL: 65 mg/dL (ref >40)
LDL Cholesterol: 30 mg/dL (ref 0–99)
Total CHOL/HDL Ratio: 2.2 RATIO
Triglycerides: 251 mg/dL — ABNORMAL HIGH (ref <150)
VLDL: 50 mg/dL — ABNORMAL HIGH (ref 0–40)

## 2018-03-07 LAB — APTT: aPTT: 34 seconds (ref 24–36)

## 2018-03-07 LAB — TROPONIN I
Troponin I: <0.03 ng/mL (ref <0.03)
Troponin I: <0.03 ng/mL (ref <0.03)
Troponin I: 0.16 ng/mL (ref <0.03)

## 2018-03-07 LAB — PROTIME-INR
INR: 1.04
PROTHROMBIN TIME: 13.5 s (ref 11.4–15.2)

## 2018-03-07 LAB — INFLUENZA PANEL BY PCR (TYPE A & B)
Influenza A By PCR: POSITIVE — AB
Influenza B By PCR: NEGATIVE

## 2018-03-07 LAB — LACTIC ACID, PLASMA: Lactic Acid, Venous: 1 mmol/L (ref 0.5–1.9)

## 2018-03-07 LAB — D-DIMER, QUANTITATIVE: D-Dimer, Quant: 2.58 ug/mL-FEU — ABNORMAL HIGH (ref 0.00–0.50)

## 2018-03-07 MED ORDER — DOXYCYCLINE HYCLATE 100 MG PO TABS
100.0000 mg | ORAL_TABLET | Freq: Two times a day (BID) | ORAL | Status: DC
  Administered 2018-03-07: 100 mg via ORAL
  Filled 2018-03-07: qty 1

## 2018-03-07 MED ORDER — DEXTROSE-NACL 5-0.45 % IV SOLN
INTRAVENOUS | Status: DC
  Administered 2018-03-07 – 2018-03-10 (×7): via INTRAVENOUS

## 2018-03-07 MED ORDER — LEVALBUTEROL HCL 1.25 MG/0.5ML IN NEBU
1.2500 mg | INHALATION_SOLUTION | Freq: Four times a day (QID) | RESPIRATORY_TRACT | Status: DC
  Administered 2018-03-07: 1.25 mg via RESPIRATORY_TRACT

## 2018-03-07 MED ORDER — NITROGLYCERIN 0.4 MG SL SUBL
0.4000 mg | SUBLINGUAL_TABLET | SUBLINGUAL | Status: DC | PRN
  Administered 2018-03-08: 0.4 mg via SUBLINGUAL
  Filled 2018-03-07: qty 1

## 2018-03-07 MED ORDER — NORTRIPTYLINE HCL 25 MG PO CAPS: 25.0000 mg | ORAL_CAPSULE | Freq: Every day | ORAL | Status: DC

## 2018-03-07 MED ORDER — SODIUM CHLORIDE 0.9 % IV BOLUS
2000.0000 mL | Freq: Once | INTRAVENOUS | Status: AC
  Administered 2018-03-07: 2000 mL via INTRAVENOUS

## 2018-03-07 MED ORDER — NICOTINE 21 MG/24HR TD PT24
21.0000 mg | MEDICATED_PATCH | Freq: Every day | TRANSDERMAL | Status: DC
  Administered 2018-03-11: 21 mg via TRANSDERMAL
  Filled 2018-03-07 (×4): qty 1

## 2018-03-07 MED ORDER — HYDROMORPHONE HCL 1 MG/ML IJ SOLN
2.0000 mg | Freq: Once | INTRAMUSCULAR | Status: AC
  Administered 2018-03-07: 2 mg via SUBCUTANEOUS
  Filled 2018-03-07: qty 2

## 2018-03-07 MED ORDER — SODIUM CHLORIDE 0.9% FLUSH
10.0000 mL | INTRAVENOUS | Status: DC | PRN
  Administered 2018-03-08: 20 mL
  Filled 2018-03-07: qty 40

## 2018-03-07 MED ORDER — OXYCODONE HCL ER 15 MG PO T12A
15.0000 mg | EXTENDED_RELEASE_TABLET | Freq: Two times a day (BID) | ORAL | Status: DC
  Administered 2018-03-07 – 2018-03-11 (×8): 15 mg via ORAL
  Filled 2018-03-07 (×8): qty 1

## 2018-03-07 MED ORDER — OSELTAMIVIR PHOSPHATE 30 MG PO CAPS
30.0000 mg | ORAL_CAPSULE | Freq: Two times a day (BID) | ORAL | Status: DC
  Administered 2018-03-07 – 2018-03-11 (×8): 30 mg via ORAL
  Filled 2018-03-07 (×8): qty 1

## 2018-03-07 MED ORDER — OSELTAMIVIR PHOSPHATE 75 MG PO CAPS
75.0000 mg | ORAL_CAPSULE | Freq: Two times a day (BID) | ORAL | Status: DC
  Administered 2018-03-07: 75 mg via ORAL
  Filled 2018-03-07: qty 1

## 2018-03-07 MED ORDER — DM-GUAIFENESIN ER 30-600 MG PO TB12
1.0000 | ORAL_TABLET | Freq: Two times a day (BID) | ORAL | Status: DC | PRN
  Administered 2018-03-08: 1 via ORAL
  Filled 2018-03-07: qty 1

## 2018-03-07 MED ORDER — POLYETHYLENE GLYCOL 3350 17 G PO PACK: 17.0000 g | PACK | Freq: Every day | ORAL | Status: DC | PRN

## 2018-03-07 MED ORDER — LEVALBUTEROL HCL 1.25 MG/0.5ML IN NEBU: 1.2500 mg | INHALATION_SOLUTION | Freq: Four times a day (QID) | RESPIRATORY_TRACT | Status: DC

## 2018-03-07 MED ORDER — ONDANSETRON HCL 4 MG/2ML IJ SOLN: 4.0000 mg | Freq: Four times a day (QID) | INTRAMUSCULAR | Status: DC | PRN

## 2018-03-07 MED ORDER — PANTOPRAZOLE SODIUM 40 MG IV SOLR
40.0000 mg | Freq: Two times a day (BID) | INTRAVENOUS | Status: DC
  Administered 2018-03-07: 40 mg via INTRAVENOUS
  Filled 2018-03-07: qty 40

## 2018-03-07 MED ORDER — PANTOPRAZOLE SODIUM 40 MG PO TBEC
40.0000 mg | DELAYED_RELEASE_TABLET | Freq: Two times a day (BID) | ORAL | Status: DC
  Administered 2018-03-07 – 2018-03-11 (×8): 40 mg via ORAL
  Filled 2018-03-07 (×7): qty 1

## 2018-03-07 MED ORDER — LEVALBUTEROL HCL 1.25 MG/0.5ML IN NEBU
INHALATION_SOLUTION | RESPIRATORY_TRACT | Status: AC
  Filled 2018-03-07: qty 0.5

## 2018-03-07 MED ORDER — SENNOSIDES-DOCUSATE SODIUM 8.6-50 MG PO TABS
1.0000 | ORAL_TABLET | Freq: Two times a day (BID) | ORAL | Status: DC
  Administered 2018-03-09: 1 via ORAL
  Filled 2018-03-07 (×5): qty 1

## 2018-03-07 MED ORDER — LEVALBUTEROL HCL 1.25 MG/0.5ML IN NEBU
1.2500 mg | INHALATION_SOLUTION | Freq: Four times a day (QID) | RESPIRATORY_TRACT | Status: DC | PRN
  Administered 2018-03-08: 1.25 mg via RESPIRATORY_TRACT
  Filled 2018-03-07: qty 0.5

## 2018-03-07 MED ORDER — HYDROMORPHONE 1 MG/ML IV SOLN
INTRAVENOUS | Status: DC
  Administered 2018-03-07: 3 mg via INTRAVENOUS
  Administered 2018-03-07: 0.5 mg via INTRAVENOUS
  Administered 2018-03-07: 1.5 mg via INTRAVENOUS
  Administered 2018-03-07: 30 mg via INTRAVENOUS
  Administered 2018-03-08: 1 mg via INTRAVENOUS
  Administered 2018-03-08: 0 mg via INTRAVENOUS
  Administered 2018-03-08: 3 mg via INTRAVENOUS
  Administered 2018-03-08: 1.5 mg via INTRAVENOUS
  Administered 2018-03-08: 1 mg via INTRAVENOUS
  Administered 2018-03-08: 1.5 mg via INTRAVENOUS
  Administered 2018-03-09: 3.5 mg via INTRAVENOUS
  Administered 2018-03-09: 30 mg via INTRAVENOUS
  Administered 2018-03-09: 1.5 mg via INTRAVENOUS
  Administered 2018-03-09 (×2): 2 mg via INTRAVENOUS
  Administered 2018-03-10: 2.5 mg via INTRAVENOUS
  Administered 2018-03-10: 1 mg via INTRAVENOUS
  Administered 2018-03-10: 2 mg via INTRAVENOUS
  Administered 2018-03-10: 1 mg via INTRAVENOUS
  Administered 2018-03-10: 2.5 mg via INTRAVENOUS
  Administered 2018-03-11: 1 mg via INTRAVENOUS
  Administered 2018-03-11: 4.5 mg via INTRAVENOUS
  Administered 2018-03-11: 1 mg via INTRAVENOUS
  Filled 2018-03-07: qty 25
  Filled 2018-03-07 (×3): qty 30

## 2018-03-07 MED ORDER — SODIUM CHLORIDE 0.9 % IV SOLN
1.0000 g | Freq: Every day | INTRAVENOUS | Status: DC
  Administered 2018-03-07: 1 g via INTRAVENOUS
  Filled 2018-03-07: qty 10

## 2018-03-07 MED ORDER — HYDROXYUREA 500 MG PO CAPS
1500.0000 mg | ORAL_CAPSULE | Freq: Every day | ORAL | Status: DC
  Administered 2018-03-07 – 2018-03-11 (×5): 1500 mg via ORAL
  Filled 2018-03-07 (×5): qty 3

## 2018-03-07 MED ORDER — SODIUM CHLORIDE 0.9% FLUSH
9.0000 mL | INTRAVENOUS | Status: DC | PRN
  Administered 2018-03-09: 9 mL via INTRAVENOUS
  Filled 2018-03-07: qty 9

## 2018-03-07 MED ORDER — SODIUM CHLORIDE 0.9% FLUSH
10.0000 mL | Freq: Two times a day (BID) | INTRAVENOUS | Status: DC
  Administered 2018-03-07 – 2018-03-10 (×4): 10 mL

## 2018-03-07 MED ORDER — FOLIC ACID 1 MG PO TABS
1.0000 mg | ORAL_TABLET | Freq: Every day | ORAL | Status: DC
  Administered 2018-03-07 – 2018-03-11 (×5): 1 mg via ORAL
  Filled 2018-03-07 (×5): qty 1

## 2018-03-07 MED ORDER — NALOXONE HCL 0.4 MG/ML IJ SOLN: 0.4000 mg | INTRAMUSCULAR | Status: DC | PRN

## 2018-03-07 NOTE — Progress Notes (Signed)
PHARMACY NOTE:  ANTIMICROBIAL RENAL DOSAGE ADJUSTMENT  Current antimicrobial regimen includes a mismatch between antimicrobial dosage and estimated renal function.  As per policy approved by the Pharmacy & Therapeutics and Medical Executive Committees, the antimicrobial dosage will be adjusted accordingly.  Current antimicrobial dosage:  Tamiflu 75mg  BID  Indication: positive influenza  Renal Function:  Estimated Creatinine Clearance: 34.1 mL/min (A) (by C-G formula based on SCr of 2.43 mg/dL (H)). []      On intermittent HD, scheduled: []      On CRRT    Antimicrobial dosage has been changed to:  Tamiflu 30mg  BID  Additional comments:   Thank you for allowing pharmacy to be a part of this patient's care.  Kara Mead, [facility] 03/07/2018 12:36 PM

## 2018-03-07 NOTE — ED Notes (Signed)
ED TO INPATIENT HANDOFF REPORT  Name/Age/Gender [name] [name]. 38 y.o. male  Code Status    Code Status Orders  (From admission, onward)         Start     Ordered   03/07/18 0042  Full code  Continuous     03/07/18 0043        Code Status History    Date Active Date Inactive Code Status Order ID Comments User Context   01/21/2018 2008 01/23/2018 1834 Full Code [id number]  Jonetta Osgood, MD ED   09/19/2017 2214 09/22/2017 1633 Full Code [id number]  Bethena Roys, MD ED   07/23/2017 2234 07/26/2017 1527 Full Code [id number]  [place], [place], DO Inpatient   05/12/2017 1514 05/18/2017 1850 Full Code [id number]  [name], MD Inpatient   04/20/2017 0205 05/03/2017 1855 Full Code [id number]  [name], MD ED   04/07/2017 1712 04/09/2017 1238 Full Code [id number]  Marene Lenz, MD ED   02/27/2017 2140 03/04/2017 1508 Full Code [id number]  Jani Gravel, MD ED   12/13/2016 1812 12/21/2016 1659 Full Code [id number]  Leana Gamer, MD ED   10/14/2016 1944 10/18/2016 1637 Full Code [id number]  Jani Gravel, MD ED   08/02/2016 0124 08/04/2016 1731 Full Code [id number]  Norval Morton, MD ED   02/26/2016 2148 02/27/2016 1750 Full Code [id number]  Etta Quill, DO ED   03/11/2015 1636 03/15/2015 1544 Full Code [id number]  Janora Norlander, DO ED   11/21/2014 0308 11/24/2014 2020 Full Code [id number]  [place], York Ram, MD Inpatient   08/18/2014 0148 08/20/2014 1820 Full Code [id number]  Archie Patten, MD Inpatient   03/22/2014 2315 03/24/2014 1826 Full Code [id number]  Toy Baker, MD ED   03/19/2014 2048 03/20/2014 2250 Full Code [id number]  Lorna Few, DO Inpatient   12/26/2013 0344 12/29/2013 2103 Full Code [id number]  Timmothy Euler, MD ED   07/28/2013 1833 07/31/2013 1729 Full Code [id number]  Coral Spikes, DO Inpatient   05/09/2013 1926 05/10/2013 1644 Full Code [id number]  Hilton Sinclair, MD ED   01/24/2013 0143 01/26/2013 1931 Full Code [id number]   Leone Haven, MD Inpatient   09/25/2012 0846 09/26/2012 2020 Full Code [id number]  Ma Hillock, DO ED      Home/SNF/Other Given to floor  Chief Complaint Sickle cell crisis, Chest pain, Throwing up blood  Level of Care/Admitting Diagnosis ED Disposition    ED Disposition Condition [facility] Area: [place] [[id number]]  Level of Care: Telemetry [5]  Admit to tele based on following criteria: Other see comments  Comments: CP  Diagnosis: Sickle cell pain crisis [facility]) [[id number]]  Admitting Physician: Ivor Costa [[id number]]  Attending Physician: Ivor Costa [[id number]]  Estimated length of stay: past midnight tomorrow  Certification:: I certify this patient will need inpatient services for at least 2 midnights  PT Class (Do Not Modify): Inpatient [101]  PT Acc Code (Do Not Modify): Private [1]       Medical History Past Medical History:  Diagnosis Date  . Arachnoid Cyst of brain bilaterally    "2 really small ones in the back of my head; inside; saw them w/MRI" (09/25/2012)  . Bacterial pneumonia ~ 2012   "caught it here in the hospital" (09/25/2012)  . Chronic kidney disease    "from my sickle cell" (09/25/2012)  . CKD (chronic kidney disease), stage II   . GERD (gastroesophageal reflux disease)    "  after I eat alot of spicey foods" (09/25/2012)  . Gynecomastia, male 07/10/2012  . History of blood transfusion    "always related to sickle cell crisis" (09/25/2012)  . 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   Noted in noted from Hematology notes at [facility]  . Migraines    "take RX qd to prevent them" (09/25/2012)  . Sickle cell anemia (HCC)   . Sickle cell crisis ([place]) 09/25/2012  . Sickle cell nephropathy ([place]) 07/10/2012  . Sinus tachycardia   . Tachycardia with heart rate 121-140 beats per minute with ambulation 08/04/2016    Allergies Allergies  Allergen Reactions  . Nsaids Other (See Comments)    Kidney disease  . Morphine And  Related Other (See Comments)    "real bad headaches"    IV Location/Drains/Wounds Patient Lines/Drains/Airways Status   Active Line/Drains/Airways    Name:   Placement date:   Placement time:   Site:   Days:   Peripheral IV 03/06/18 Left Antecubital   03/06/18    2225    Antecubital   1   Incision (Closed) 04/25/17 Flank Left   04/25/17    1950     316          Labs/Imaging Results for orders placed or performed during the hospital encounter of 03/06/18 (from the past 48 hour(s))  Comprehensive metabolic panel     Status: Abnormal   Collection Time: 03/06/18  5:29 PM  Result Value Ref Range   Sodium 135 135 - 145 mmol/L   Potassium 4.1 3.5 - 5.1 mmol/L   Chloride 111 98 - 111 mmol/L   CO2 17 (L) 22 - 32 mmol/L   Glucose, Bld 95 70 - 99 mg/dL   BUN 23 (H) 6 - 20 mg/dL   Creatinine, Ser 2.43 (H) 0.61 - 1.24 mg/dL   Calcium 8.3 (L) 8.9 - 10.3 mg/dL   Total Protein 6.4 (L) 6.5 - 8.1 g/dL   Albumin 3.1 (L) 3.5 - 5.0 g/dL   AST 202 (H) 15 - 41 U/L   ALT 117 (H) 0 - 44 U/L   Alkaline Phosphatase 188 (H) 38 - 126 U/L   Total Bilirubin 1.4 (H) 0.3 - 1.2 mg/dL   GFR calc non Af Amer 33 (L) >60 mL/min   GFR calc Af Amer 38 (L) >60 mL/min   Anion gap 7 5 - 15    Comment: Performed at [facility], [place] [address], [place], [place] [address]  CBC with Differential     Status: Abnormal   Collection Time: 03/06/18  5:29 PM  Result Value Ref Range   WBC 7.4 4.0 - 10.5 K/uL    Comment: WHITE COUNT CONFIRMED ON SMEAR   RBC 1.57 (L) 4.22 - 5.81 MIL/uL   Hemoglobin 7.8 (L) 13.0 - 17.0 g/dL   HCT 22.9 (L) 39.0 - 52.0 %   MCV 145.9 (H) 80.0 - 100.0 fL   MCH 49.7 (H) 26.0 - 34.0 pg   MCHC 34.1 30.0 - 36.0 g/dL   RDW 16.1 (H) 11.5 - 15.5 %   Platelets 214 150 - 400 K/uL    Comment: REPEATED TO VERIFY SPECIMEN CHECKED FOR CLOTS    nRBC 1.1 (H) 0.0 - 0.2 %   Neutrophils Relative % 46 %   Neutro Abs 3.6 1.7 - 7.7 K/uL   Lymphocytes Relative 42 %   Lymphs Abs 3.2 0.7 -  4.0 K/uL   Monocytes Relative 9 %   Monocytes Absolute 0.7 0.1 -  1.0 K/uL   Eosinophils Relative 2 %   Eosinophils Absolute 0.1 0.0 - 0.5 K/uL   Basophils Relative 0 %   Basophils Absolute 0.0 0.0 - 0.1 K/uL   Immature Granulocytes 1 %   Abs Immature Granulocytes 0.05 0.00 - 0.07 K/uL   Tammy Sours Bodies PRESENT    Sickle Cells PRESENT    Target Cells PRESENT     Comment: Performed at [facility], [place] [address] [place], [place] [address]  Reticulocytes     Status: Abnormal   Collection Time: 03/06/18  5:29 PM  Result Value Ref Range   Retic Ct Pct 8.2 (H) 0.4 - 3.1 %   RBC. 1.57 (L) 4.22 - 5.81 MIL/uL   Retic Count, Absolute 127.6 19.0 - 186.0 K/uL   Immature Retic Fract 8.7 2.3 - 15.9 %    Comment: Performed at [facility], [place] [address] [place], [place] [address]  Urinalysis, Routine w reflex microscopic     Status: Abnormal   Collection Time: 03/06/18  6:40 PM  Result Value Ref Range   Color, Urine YELLOW YELLOW   APPearance CLEAR CLEAR   Specific Gravity, Urine 1.013 1.005 - 1.030   pH 6.0 5.0 - 8.0   Glucose, UA 50 (A) NEGATIVE mg/dL   Hgb urine dipstick SMALL (A) NEGATIVE   Bilirubin Urine NEGATIVE NEGATIVE   Ketones, ur NEGATIVE NEGATIVE mg/dL   Protein, ur >=300 (A) NEGATIVE mg/dL   Nitrite NEGATIVE NEGATIVE   Leukocytes, UA NEGATIVE NEGATIVE   RBC / HPF 0-5 0 - 5 RBC/hpf   WBC, UA 0-5 0 - 5 WBC/hpf   Bacteria, UA NONE SEEN NONE SEEN   Mucus PRESENT     Comment: Performed at [facility], [place] [address], [place], [place] [address]  Urine rapid drug screen (hosp performed)     Status: Abnormal   Collection Time: 03/06/18  6:40 PM  Result Value Ref Range   Opiates NONE DETECTED NONE DETECTED   Cocaine NONE DETECTED NONE DETECTED   Benzodiazepines NONE DETECTED NONE DETECTED   Amphetamines NONE DETECTED NONE DETECTED   Tetrahydrocannabinol POSITIVE (A) NONE DETECTED   Barbiturates NONE DETECTED NONE  DETECTED    Comment: (NOTE) DRUG SCREEN FOR MEDICAL PURPOSES ONLY.  IF CONFIRMATION IS NEEDED FOR ANY PURPOSE, NOTIFY LAB WITHIN 5 DAYS. LOWEST DETECTABLE LIMITS FOR URINE DRUG SCREEN Drug Class                     Cutoff (ng/mL) Amphetamine and metabolites    1000 Barbiturate and metabolites    200 Benzodiazepine                 179 Tricyclics and metabolites     300 Opiates and metabolites        300 Cocaine and metabolites        300 THC                            50 Performed at [facility], [place] [address], [place], [place] [address]    Dg Chest 2 View  Result Date: 03/06/2018 CLINICAL DATA:  Chest pain and productive cough beginning yesterday. Sickle cell disease. EXAM: CHEST - 2 VIEW COMPARISON:  01/21/2018 FINDINGS: The heart size and mediastinal contours are within normal limits. Stable chronic pulmonary interstitial prominence. No evidence of acute infiltrate or edema. No evidence of pleural effusion. Chronic  changes of avascular necrosis again seen involving the right humeral head. IMPRESSION: 1. Stable exam. No active cardiopulmonary disease. 2. 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 Electronically Signed   By: Earle Gell M.D.   On: 03/06/2018 17:51    Pending Labs Unresulted Labs (From admission, onward)    Start     Ordered   03/07/18 0500  Hemoglobin A1c  Tomorrow morning,   R     03/07/18 0036   03/07/18 0500  Lipid panel  Tomorrow morning,   R    Comments:  Please obtain as a fasting lipid panel - should not have eaten/ drank food for 8 hours prior to labs.    03/07/18 0036   03/07/18 0040  Culture, blood (x 2)  BLOOD CULTURE X 2,   STAT    Comments:  INITIATE ANTIBIOTICS WITHIN 1 HOUR AFTER BLOOD CULTURES DRAWN.  If unable to obtain blood cultures, call MD immediately regarding antibiotic instructions.    03/07/18 0039   03/07/18 0040  Lactic acid, plasma  STAT Now then every 3 hours,   STAT     03/07/18 0039    03/07/18 0040  Procalcitonin  ONCE - STAT,   R     03/07/18 0039   03/07/18 0039  Expectorated sputum assessment w rflx to resp cult  Once,   R     03/07/18 0038   03/07/18 0038  CBC  Now then every 6 hours,   R     03/07/18 0037   03/07/18 0037  Troponin I - Now Then Q6H  Now then every 6 hours,   R     03/07/18 0036   03/07/18 0036  Protime-INR  Once,   R     03/07/18 0035   03/07/18 0036  APTT  Once,   R     03/07/18 0035   03/07/18 0036  Type and screen [facility]  Once,   R    Comments:  [place]    03/07/18 0035   03/07/18 0035  Influenza panel by PCR (type A & B)  Once,   R     03/07/18 0035          Vitals/Pain Today's Vitals   03/07/18 0000 03/07/18 0030 03/07/18 0100 03/07/18 0130  BP: (!) 122/103 112/75 103/70 108/69  Pulse: (!) 112 94 92 (!) 105  Resp: 18 12 17 20   Temp:      TempSrc:      SpO2: 99% 98% 98% 99%  Weight:      Height:      PainSc:        Isolation Precautions No active isolations  Medications Medications  HYDROmorphone (DILAUDID) injection 1 mg (has no administration in time range)    Or  HYDROmorphone (DILAUDID) injection 1 mg (has no administration in time range)  HYDROmorphone (DILAUDID) injection 1 mg ( Intravenous See Alternative 03/06/18 2127)    Or  HYDROmorphone (DILAUDID) injection 1 mg (1 mg Subcutaneous Given 03/06/18 2127)  diphenhydrAMINE (BENADRYL) capsule 25-50 mg (has no administration in time range)  dextrose 5 %-0.45 % sodium chloride infusion (has no administration in time range)  oxyCODONE (OXYCONTIN) 12 hr tablet 15 mg (has no administration in time range)  hydroxyurea (HYDREA) capsule 1,500 mg (has no administration in time range)  nortriptyline (PAMELOR) capsule 25 mg (has no administration in time range)  folic acid (FOLVITE) tablet 1 mg (has no administration in time range)  nitroGLYCERIN (NITROSTAT) SL tablet 0.4 mg (has no administration in time range)   dextromethorphan-guaiFENesin (MUCINEX DM) 30-600 MG per 12 hr tablet 1 tablet (has no administration in time range)  nicotine (NICODERM CQ - dosed in mg/24 hours) patch 21 mg (has no administration in time range)  pantoprazole (PROTONIX) injection 40 mg (has no administration in time range)  sodium chloride 0.9 % bolus 2,000 mL (2,000 mLs Intravenous New Bag/Given 03/07/18 0123)  cefTRIAXone (ROCEPHIN) 1 g in sodium chloride 0.9 % 100 mL IVPB (1 g Intravenous New Bag/Given 03/07/18 0122)  doxycycline (VIBRA-TABS) tablet 100 mg (100 mg Oral Given 03/07/18 0124)  naloxone (NARCAN) injection 0.4 mg (has no administration in time range)    And  sodium chloride flush (NS) 0.9 % injection 9 mL (has no administration in time range)  ondansetron (ZOFRAN) injection 4 mg (has no administration in time range)  HYDROmorphone (DILAUDID) 1 mg/mL PCA injection (has no administration in time range)  senna-docusate (Senokot-S) tablet 1 tablet (has no administration in time range)  polyethylene glycol (MIRALAX / GLYCOLAX) packet 17 g (has no administration in time range)  levalbuterol (XOPENEX) 1.25 MG/0.5ML nebulizer solution (  Not Given 03/07/18 0113)  levalbuterol (XOPENEX) nebulizer solution 1.25 mg (has no administration in time range)  ketorolac (TORADOL) 15 MG/ML injection 15 mg (15 mg Intravenous Given 03/06/18 2321)  HYDROmorphone (DILAUDID) injection 0.5 mg ( Intravenous See Alternative 03/06/18 1932)    Or  HYDROmorphone (DILAUDID) injection 0.5 mg (0.5 mg Subcutaneous Given 03/06/18 1932)  HYDROmorphone (DILAUDID) injection 1 mg ( Intravenous See Alternative 03/06/18 2005)    Or  HYDROmorphone (DILAUDID) injection 1 mg (1 mg Subcutaneous Given 03/06/18 2005)    Mobility walks with person assist

## 2018-03-07 NOTE — Progress Notes (Signed)
Patient is very hard stick. IV infiltrated at this time, awaiting IV team.

## 2018-03-07 NOTE — H&P (Signed)
History and Physical    Thedore A Constellation [name]. ALP:[id number] DOB: 09/04/1980 DOA: 03/06/2018  Referring MD/NP/PA:   PCP: Lanae Boast, [place]   Patient coming from:  The patient is coming from home.  At baseline, pt is independent for most of ADL.        Chief Complaint: pain all over and chest pain, fever and chill  HPI: [name] [name]. is a 38 y.o. male with medical history significant of sickle cell anemia, CKD-3, glomerulonephritis, arachnoid cyst of her bilateral brain, chronic tachycardia, tobacco abuse, marijuana abuse, abnormal liver function, GERD, depression, who presents with pain all over and chest pain, fever and chills.  Patient states that he has worsening pain all over since yesterday.  He also has chest pain, which involves the whole chest, constant, 10 out of 10 in severity, sharp, nonradiating.  It is pleuritic, aggravated by coughing.  He has mild shortness of breath and cough, fever and chills.  He reports runny nose and sore throat.  Patient states that coughs up a little blood, the color of which is between dark and bright red.Patient states that she has nausea and vomited twice.  No abdominal pain or diarrhea. He also states that he vomited a little blood twice, the color of which is between dark and bright red color.  He denies tenderness in the calf area.  Patient is taking ibuprofen at home.  Denies symptoms of UTI or unilateral weakness.  Pt had negative colonoscopy on 04/23/2017 by Dr. Henrene Pastor.  Patient states that he had negative EGD approximately 2 years ago.  ED Course: pt was found to have WBC 7.4, stable hemoglobin 7.8 (7.1 on 01/23/2018), positive UDS for THC, negative urinalysis, stable renal function, temperature 100.1, tachycardia, tachypnea, oxygen saturation 97% on room air, negative chest x-ray for infiltration.  Patient is admitted to telemetry bed as inpatient.  Review of Systems:   General: has fevers, chills, no body weight gain, has  fatigue HEENT: no blurry vision, hearing changes or sore throat Respiratory: has dyspnea, coughing, no wheezing CV: has chest pain, no palpitations GI: has nausea, vomiting, no abdominal pain, diarrhea, constipation GU: no dysuria, burning on urination, increased urinary frequency, hematuria  Ext: no leg edema Neuro: no unilateral weakness, numbness, or tingling, no vision change or hearing loss Skin: no rash, no skin tear. MSK: has pain all over Heme: No easy bruising.  Travel history: No recent long distant travel.  Allergy:  Allergies  Allergen Reactions  . Nsaids Other (See Comments)    Kidney disease  . Morphine And Related Other (See Comments)    "real bad headaches"    Past Medical History:  Diagnosis Date  . Arachnoid Cyst of brain bilaterally    "2 really small ones in the back of my head; inside; saw them w/MRI" (09/25/2012)  . Bacterial pneumonia ~ 2012   "caught it here in the hospital" (09/25/2012)  . Chronic kidney disease    "from my sickle cell" (09/25/2012)  . CKD (chronic kidney disease), stage II   . GERD (gastroesophageal reflux disease)    "after I eat alot of spicey foods" (09/25/2012)  . Gynecomastia, male 07/10/2012  . History of blood transfusion    "always related to sickle cell crisis" (09/25/2012)  . 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   Noted in noted from Hematology [facility] at [facility]  . Migraines    "take RX qd to prevent them" (09/25/2012)  . Sickle cell anemia (HCC)   . Sickle  cell crisis ([place]) 09/25/2012  . Sickle cell nephropathy ([place]) 07/10/2012  . Sinus tachycardia   . Tachycardia with heart rate 121-140 beats per minute with ambulation 08/04/2016    Past Surgical History:  Procedure Laterality Date  . CHOLECYSTECTOMY  ~ 2012  . COLONOSCOPY N/A 04/23/2017   Procedure: COLONOSCOPY;  Surgeon: Irene Shipper, MD;  Location: [facility];  Service: Endoscopy;  Laterality: N/A;  . IR FLUORO GUIDE CV LINE RIGHT  12/17/2016  . IR  REMOVAL TUN CV CATH W/O FL  12/21/2016  . IR US GUIDE VASC ACCESS RIGHT  12/17/2016  . spleenectomy      Social History:  reports that he has been smoking cigarettes. He has never used smokeless tobacco. He reports previous drug use. Drug: Marijuana. He reports that he does not drink alcohol.  Family History:  Family History  Problem Relation Age of Onset  . Breast cancer Mother      Prior to Admission medications   Medication Sig Start Date End Date Taking? Authorizing Provider  ergocalciferol (DRISDOL) 50000 units capsule Take 1 capsule (50,000 Units total) by mouth once a week. 05/13/17  Yes [name], FNP  folic acid (FOLVITE) 1 MG tablet Take 1 tablet (1 mg total) daily by mouth. 12/22/16  Yes Leana Gamer, MD  hydroxyurea (HYDREA) 500 MG capsule TAKE 3 CAPSULES (1,500 MG TOTAL) BY MOUTH DAILY. Patient taking differently: Take 1,500 mg by mouth daily.  08/29/17  Yes Truett Mainland, DO  ibuprofen (ADVIL,MOTRIN) 200 MG tablet Take 600 mg by mouth daily as needed (severe pain).   Yes [provider]  nortriptyline (PAMELOR) 25 MG capsule TAKE 1 CAPSULE (25 MG TOTAL) BY MOUTH AT BEDTIME. Patient taking differently: Take 25 mg by mouth at bedtime.  09/03/17  Yes Donnamae Jude, MD  oxyCODONE (OXYCONTIN) 15 mg 12 hr tablet Take 15 mg by mouth every 12 (twelve) hours.   Yes [provider]  gabapentin (NEURONTIN) 300 MG capsule Take 1 capsule (300 mg total) by mouth 3 (three) times daily. Patient not taking: Reported on 01/21/2018 01/21/18   Lanae Boast, FNP  Oxycodone HCl 10 MG TABS Take 1 tablet (10 mg total) by mouth every 6 (six) hours as needed (pain). Patient not taking: Reported on 02/04/2018 01/23/18   [name], FNP    Physical Exam: Vitals:   03/07/18 0100 03/07/18 0130 03/07/18 0226 03/07/18 0604  BP: 103/70 108/69 116/79 104/77  Pulse: 92 (!) 105 90 82  Resp: 17 20 14 16   Temp:   99.1 F (37.3 C) 99.5 F (37.5 C)  TempSrc:   Oral  Oral  SpO2: 98% 99% 97% 98%  Weight:      Height:       General: Not in acute distress HEENT:       Eyes: PERRL, EOMI, no scleral icterus.       ENT: No discharge from the ears and nose, no pharynx injection, no tonsillar enlargement.        Neck: No JVD, no bruit, no mass felt. Heme: No neck lymph node enlargement. Cardiac: S1/S2, RRR, No murmurs, No gallops or rubs. Respiratory: No rales, wheezing, rhonchi or rubs. GI: Soft, nondistended, nontender, no rebound pain, no organomegaly, BS present. GU: No hematuria Ext: No pitting leg edema bilaterally. 2+DP/PT pulse bilaterally. Musculoskeletal: No joint deformities, No joint redness or warmth, no limitation of ROM in spin. Skin: No rashes.  Neuro: Alert, oriented X3, cranial nerves II-XII grossly intact,  moves all extremities normally.  Psych: Patient is not psychotic, no suicidal or hemocidal ideation.  Labs on Admission: I have personally reviewed following labs and imaging studies  CBC: Recent Labs  Lab 03/06/18 1729 03/07/18 0329  WBC 7.4 7.5  NEUTROABS 3.6  --   HGB 7.8* 7.3*  HCT 22.9* 21.3*  MCV 145.9* 143.9*  PLT 214 527   Basic Metabolic Panel: Recent Labs  Lab 03/06/18 1729  NA 135  K 4.1  CL 111  CO2 17*  GLUCOSE 95  BUN 23*  CREATININE 2.43*  CALCIUM 8.3*   GFR: Estimated Creatinine Clearance: 34.1 mL/min (A) (by C-G formula based on SCr of 2.43 mg/dL (H)). Liver Function Tests: Recent Labs  Lab 03/06/18 1729  AST 202*  ALT 117*  ALKPHOS 188*  BILITOT 1.4*  PROT 6.4*  ALBUMIN 3.1*   No results for input(s): LIPASE, AMYLASE in the last 168 hours. No results for input(s): AMMONIA in the last 168 hours. Coagulation Profile: Recent Labs  Lab 03/07/18 0329  INR 1.04   Cardiac Enzymes: Recent Labs  Lab 03/07/18 0329  TROPONINI <0.03   BNP (last 3 results) No results for input(s): PROBNP in the last 8760 hours. HbA1C: No results for input(s): HGBA1C in the last 72 hours. CBG: No  results for input(s): GLUCAP in the last 168 hours. Lipid Profile: No results for input(s): CHOL, HDL, LDLCALC, TRIG, CHOLHDL, LDLDIRECT in the last 72 hours. Thyroid Function Tests: No results for input(s): TSH, T4TOTAL, FREET4, T3FREE, THYROIDAB in the last 72 hours. Anemia Panel: Recent Labs    03/06/18 1729  RETICCTPCT 8.2*   Urine analysis:    Component Value Date/Time   COLORURINE YELLOW 03/06/2018 1840   APPEARANCEUR CLEAR 03/06/2018 1840   LABSPEC 1.013 03/06/2018 1840   PHURINE 6.0 03/06/2018 1840   GLUCOSEU 50 (A) 03/06/2018 1840   HGBUR SMALL (A) 03/06/2018 1840   HGBUR small 03/10/2010 [place] 03/06/2018 1840   BILIRUBINUR neg 02/04/2018 [place] 03/06/2018 1840   PROTEINUR >=300 (A) 03/06/2018 1840   UROBILINOGEN 0.2 02/04/2018 1516   UROBILINOGEN 0.2 05/11/2017 1335   NITRITE NEGATIVE 03/06/2018 1840   LEUKOCYTESUR NEGATIVE 03/06/2018 1840   Sepsis Labs: @LABRCNTIP (procalcitonin:4,lacticidven:4) )No results found for this or any previous visit (from the past 240 hour(s)).   Radiological Exams on Admission: Dg Chest 2 View  Result Date: 03/06/2018 CLINICAL DATA:  Chest pain and productive cough beginning yesterday. Sickle cell disease. EXAM: CHEST - 2 VIEW COMPARISON:  01/21/2018 FINDINGS: The heart size and mediastinal contours are within normal limits. Stable chronic pulmonary interstitial prominence. No evidence of acute infiltrate or edema. No evidence of pleural effusion. Chronic changes of avascular necrosis again seen involving the right humeral head. IMPRESSION: 1. Stable exam. No active cardiopulmonary disease. 2. 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 Electronically Signed   By: Earle Gell M.D.   On: 03/06/2018 17:51     EKG: Independently reviewed.  Sinus rhythm, QTC 410, LAE, early R wave progression, nonspecific T wave change.   Assessment/Plan Principal Problem:   Sickle cell  pain crisis (HCC) Active Problems:   Sickle cell disease, type SS.    TOBACCO ABUSE   Tachycardia   Chest pain   GERD (gastroesophageal reflux disease)   CKD (chronic kidney disease), stage III (HCC)   Sepsis (HCC)   Hematemesis   Influenza A   Sickle cell pain crisis and sickle cell disease, type SS: Hemoglobin  stable. -Admit to tele bed as inpt -Start PCA protocol for pain -no ketorolac or ibuprofen due to hematemesis (patient received 1 dose of ketorolac in the ED) -continue folic acid and hydroxyurea -Benadryl for itch -IVF: 2L NS, then D5-1/2NS at 125 cc/h -Zofran for nausea -Start high dose PCA protocol for pain: loading dose 1 mg, bolus dose 0.5 mg, lockout interval 10 min and one hour dose limit at 2 mg.   Chest pain: Chest pain, etiology is not clear.  Potential differential diagnosis include bronchitis and PE given hemoptysis..  Chest x-ray negative. - cycle CE q6 x3 and repeat EKG in the am  - prn Nitroglycerin - Risk factor stratification: will check FLP and A1C, UDS - Stat D-dimer, if positive, will need to get V/Q to r/o PE  Sepsis: Patient meets criteria for sepsis with fever, tachycardia and tachypnea.  Chest x-ray negative.  Initially thought patient could have bronchitis, and started Rocephin and doxycycline.  Later on flu PCR came back positive for flu A, which is likely the etiology. -Start Tamiflu -We will discontinue Rocephin and doxycycline -Follow-up blood culture -will get Procalcitonin and trend lactic acid levels per sepsis protocol. -IVF: 2L of NS bolus in ED, followed by 125 cc/h of D5-1/2 NS   Tobacco abuse: -Did counseling about importance of quitting smoking -Nicotine patch  Hematemesis: Pt had negative colonoscopy on 04/23/2017 by Dr. Henrene Pastor.  Patient states that he had negative EGD approximately 2 years ago ( I could find the record for this). - Start IV pantoprazole  - Zofran IV for nausea - Avoid NSAIDs and SQ heparin - Maintain IV access  (2 large bore IVs if possible). - Monitor closely and follow q6h cbc, transfuse as necessary, if Hgb<7.0 - LaB: INR, PTT and type screen -FOBT  GERD: -on Protonix IV  CKD (chronic kidney disease), stage III ([place]): Baseline creatinine 2.2- 2.4.  His creatinine is at 2.43, BUN 23, close to baseline.  -Follow-up renal function by BMP.    Inpatient status:  # Patient requires inpatient status due to high intensity of service, high risk for further deterioration and high frequency of surveillance required.  I certify that at the point of admission it is my clinical judgment that the patient will require inpatient hospital care spanning beyond 2 midnights from the point of admission.  . This patient has multiple chronic comorbidities including sickle cell anemia, CKD-3, glomerulonephritis, arachnoid cyst of her bilateral brain, chronic tachycardia, tobacco abuse, marijuana abuse, abnormal liver function, GERD, depression . Now patient has presenting symptoms include pain all over, chest pain, hemoptysis, hematemesis . The initial radiographic and laboratory data are worrisome because of positive flu A and sepsis, . Current medical needs: please see my assessment and plan . Predictability of an adverse outcome (risk): Patient has multiple comorbidities, including sickle cell anemia and CKD-3.  Now presents with multiple complaints, including sickle cell pain crisis, chest pain, hematemesis, hemoptysis.  Patient is at high risk for deteriorating.  Will need to stay in hospital for at least 2 days.     DVT ppx: SCD Code Status: Full code Family Communication:  Yes, patient's friend at bed side Disposition Plan:  Anticipate discharge back to previous home environment Consults called:  none Admission status:  Inpatient/tele    Date of Service 03/07/2018    [facility]   If 7PM-7AM, please contact night-coverage [email or web address] Password [facility] 03/07/2018, 6:42 AM

## 2018-03-07 NOTE — Progress Notes (Signed)
Subjective: [name] [name], a 38 year old male with a medical history significant for sickle cell anemia, hemoglobin SS, stage III chronic kidney disease, glomerulonephritis, arachnoid cyst of bilateral brain, chronic tachycardia, history of polysubstance abuse, GERD, depression and tobacco dependence was admitted with sepsis in the presence of sickle cell crisis.  Patient reports that he has had worsening pain all over since 1 day prior to admission.  Patient has had sick contacts over the past several weeks.  Respiratory panel positive for influenza A.  Patient endorses coughing and mild shortness of breath.  He also endorses chills.  Patient states that cough is currently nonproductive. Current pain intensity is 6/10.  He denies headache, dizziness, paresthesias, blurred vision, nausea, vomiting, or diarrhea.  Objective:  Vital signs in last 24 hours:  Vitals:   03/07/18 0707 03/07/18 1040 03/07/18 1049 03/07/18 1202  BP:  (!) 104/58    Pulse:  (!) 140 (!) 117   Resp: 18 16 18 19   Temp:  97.7 F (36.5 C)    TempSrc:  Oral    SpO2: 95% (!) 89% 92% 95%  Weight:      Height:        Intake/Output from previous day:   Intake/Output Summary (Last 24 hours) at 03/07/2018 1434 Last data filed at 03/07/2018 0707 Gross per 24 hour  Intake -  Output 400 ml  Net -400 ml   Physical Exam Constitutional:      Appearance: Normal appearance. He is obese. He is ill-appearing.  HENT:     Head: Normocephalic.     Mouth/Throat:     Mouth: Mucous membranes are moist.  Neck:     Musculoskeletal: Normal range of motion.  Cardiovascular:     Rate and Rhythm: Regular rhythm. Tachycardia present.  Pulmonary:     Effort: Tachypnea present.  Abdominal:     General: Bowel sounds are normal.  Musculoskeletal: Normal range of motion.  Skin:    General: Skin is warm and dry.  Neurological:     General: No focal deficit present.     Mental Status: He is alert. Mental status is at baseline.      Lab Results:  Basic Metabolic Panel:    Component Value Date/Time   NA 135 03/06/2018 1729   NA 138 06/13/2017 1102   K 4.1 03/06/2018 1729   CL 111 03/06/2018 1729   CO2 17 (L) 03/06/2018 1729   BUN 23 (H) 03/06/2018 1729   BUN 18 06/13/2017 1102   CREATININE 2.43 (H) 03/06/2018 1729   CREATININE 1.45 (H) 05/12/2014 1430   GLUCOSE 95 03/06/2018 1729   CALCIUM 8.3 (L) 03/06/2018 1729   CBC:    Component Value Date/Time   WBC 8.4 03/07/2018 0759   HGB 7.1 (L) 03/07/2018 0759   HGB 7.3 (L) 06/13/2017 1102   HCT 20.8 (L) 03/07/2018 0759   HCT 22.5 (L) 06/13/2017 1102   PLT 184 03/07/2018 0759   PLT 455 (H) 06/13/2017 1102   MCV 145.5 (H) 03/07/2018 0759   MCV 96 06/13/2017 1102   NEUTROABS 3.6 03/06/2018 1729   NEUTROABS 4.9 06/13/2017 1102   LYMPHSABS 3.2 03/06/2018 1729   LYMPHSABS 4.4 (H) 06/13/2017 1102   MONOABS 0.7 03/06/2018 1729   EOSABS 0.1 03/06/2018 1729   EOSABS 0.2 06/13/2017 1102   BASOSABS 0.0 03/06/2018 1729   BASOSABS 0.0 06/13/2017 1102    No results found for this or any previous visit (from the past 240 hour(s)).  Studies/Results: Dg Chest 2 View  Result Date: 03/06/2018 CLINICAL DATA:  Chest pain and productive cough beginning yesterday. Sickle cell disease. EXAM: CHEST - 2 VIEW COMPARISON:  01/21/2018 FINDINGS: The heart size and mediastinal contours are within normal limits. Stable chronic pulmonary interstitial prominence. No evidence of acute infiltrate or edema. No evidence of pleural effusion. Chronic changes of avascular necrosis again seen involving the right humeral head. IMPRESSION: 1. Stable exam. No active cardiopulmonary disease. 2. 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 Electronically Signed   By: Earle Gell M.D.   On: 03/06/2018 17:51    Medications: Scheduled Meds: . folic acid  1 mg Oral Daily  . HYDROmorphone   Intravenous Q4H  . hydroxyurea  1,500 mg Oral Daily  . nicotine  21 mg  Transdermal Daily  . [START ON 03/08/2018] nortriptyline  25 mg Oral QHS  . oseltamivir  30 mg Oral BID  . oxyCODONE  15 mg Oral Q12H  . pantoprazole  40 mg Oral BID  . senna-docusate  1 tablet Oral BID  . sodium chloride flush  10-40 mL Intracatheter Q12H   Continuous Infusions: . dextrose 5 % and 0.45% NaCl 125 mL/hr at 03/07/18 1215   PRN Meds:.dextromethorphan-guaiFENesin, diphenhydrAMINE, levalbuterol, naloxone **AND** sodium chloride flush, nitroGLYCERIN, ondansetron (ZOFRAN) IV, polyethylene glycol, sodium chloride flush   Assessment/Plan: Principal Problem:   Sickle cell pain crisis ([place]) Active Problems:   Sickle cell disease, type SS.    TOBACCO ABUSE   Tachycardia   Chest pain   Sickle-cell disease with pain (HCC)   GERD (gastroesophageal reflux disease)   CKD (chronic kidney disease), stage III (HCC)   Sepsis (HCC)   Hematemesis   Influenza A  Sickle cell anemia, HbSS in pain crisis:  Continue D5 0.45% saline at 125/h Continue Dilaudid PCA per weight-based protocol.  Settings are 0.5 mg with 10-minute lockout and 3 mL/h. Continue to hold all nephrotoxic medications  Chest pain:  Patient reports chest pain no coughing.  Chest x-ray showed no acute cardiopulmonary process. Troponin x3-  Sepsis in the presence of influenza A: Respiratory panel reveals influenza A.  Continue Tamiflu 30 mg every 12 hours, adjusted per pharmacy Continue to follow blood cultures Patient currently afebrile Acute on chronic CKD stage III: Baseline creatinine 2.2-2.4.  Current creatinine 2.43 BMP in a.m.  Hematemesis: Resolved.  Patient will continue IV pantoprazole, Zofran IV every 4 hours as needed Continue to hold NSAIDs  Anemia of chronic disease: Hemoglobin 7.0, which is consistent with patient's baseline.  Patient typically transfused when hemoglobin drops below 6.0.  We will continue to follow CBC  GERD: Continue IV pantoprazole Sepsis in presence of Influenza  A   Tobacco abuse: Patient counseled at length.  Nicotine patch offered, patient declined   Code Status: Full Code Family Communication: Friend at bedside with questions Disposition Plan: Not yet ready for discharge  .[name]  APRN, MSN, FNP-C Patient [facility] [address] [place], [place] [address] [phone]   If 5PM-7AM, please contact night-coverage.  03/07/2018, 2:34 PM  LOS: 0 days

## 2018-03-08 ENCOUNTER — Inpatient Hospital Stay: Payer: Self-pay

## 2018-03-08 DIAGNOSIS — J101 Influenza due to other identified influenza virus with other respiratory manifestations: Secondary | ICD-10-CM

## 2018-03-08 LAB — CBC
HCT: 18.7 % — ABNORMAL LOW (ref 39.0–52.0)
HCT: 18.1 % — ABNORMAL LOW (ref 39.0–52.0)
HCT: 17.3 % — ABNORMAL LOW (ref 39.0–52.0)
Hemoglobin: 6.5 g/dL — CL (ref 13.0–17.0)
Hemoglobin: 6.2 g/dL — CL (ref 13.0–17.0)
Hemoglobin: 6.1 g/dL — CL (ref 13.0–17.0)
MCH: 48.5 pg — ABNORMAL HIGH (ref 26.0–34.0)
MCH: 48.1 pg — ABNORMAL HIGH (ref 26.0–34.0)
MCH: 48.4 pg — ABNORMAL HIGH (ref 26.0–34.0)
MCHC: 34.8 g/dL (ref 30.0–36.0)
MCHC: 34.3 g/dL (ref 30.0–36.0)
MCHC: 35.3 g/dL (ref 30.0–36.0)
MCV: 139.6 fL — ABNORMAL HIGH (ref 80.0–100.0)
MCV: 140.3 fL — ABNORMAL HIGH (ref 80.0–100.0)
MCV: 137.3 fL — ABNORMAL HIGH (ref 80.0–100.0)
PLATELETS: 166 10*3/uL (ref 150–400)
PLATELETS: 171 10*3/uL (ref 150–400)
Platelets: 178 10*3/uL (ref 150–400)
RBC: 1.34 MIL/uL — ABNORMAL LOW (ref 4.22–5.81)
RBC: 1.29 MIL/uL — ABNORMAL LOW (ref 4.22–5.81)
RBC: 1.26 MIL/uL — ABNORMAL LOW (ref 4.22–5.81)
RDW: 18.7 % — AB (ref 11.5–15.5)
RDW: 18.9 % — AB (ref 11.5–15.5)
RDW: 19.6 % — AB (ref 11.5–15.5)
WBC: 10.2 10*3/uL (ref 4.0–10.5)
WBC: 9 10*3/uL (ref 4.0–10.5)
WBC: 8.5 10*3/uL (ref 4.0–10.5)
nRBC: 0.6 % — ABNORMAL HIGH (ref 0.0–0.2)
nRBC: 1 % — ABNORMAL HIGH (ref 0.0–0.2)
nRBC: 1.1 % — ABNORMAL HIGH (ref 0.0–0.2)

## 2018-03-08 LAB — HEMOGLOBIN AND HEMATOCRIT, BLOOD
HEMATOCRIT: 23.1 % — AB (ref 39.0–52.0)
Hemoglobin: 8 g/dL — ABNORMAL LOW (ref 13.0–17.0)

## 2018-03-08 LAB — HEMOGLOBIN A1C
Hgb A1c MFr Bld: <4.2 % — ABNORMAL LOW (ref 4.8–5.6)
Mean Plasma Glucose: <74 mg/dL

## 2018-03-08 LAB — PREPARE RBC (CROSSMATCH)

## 2018-03-08 MED ORDER — ACETAMINOPHEN 325 MG PO TABS
650.0000 mg | ORAL_TABLET | Freq: Once | ORAL | Status: AC
  Administered 2018-03-08: 650 mg via ORAL
  Filled 2018-03-08: qty 2

## 2018-03-08 MED ORDER — NORTRIPTYLINE HCL 25 MG PO CAPS
25.0000 mg | ORAL_CAPSULE | Freq: Two times a day (BID) | ORAL | Status: DC
  Administered 2018-03-08 – 2018-03-11 (×7): 25 mg via ORAL
  Filled 2018-03-08 (×7): qty 1

## 2018-03-08 MED ORDER — ACETAMINOPHEN 325 MG PO TABS
650.0000 mg | ORAL_TABLET | Freq: Four times a day (QID) | ORAL | Status: DC | PRN
  Administered 2018-03-08 – 2018-03-09 (×2): 650 mg via ORAL
  Filled 2018-03-08 (×2): qty 2

## 2018-03-08 MED ORDER — SODIUM CHLORIDE 0.9% FLUSH: 10.0000 mL | INTRAVENOUS | Status: DC | PRN

## 2018-03-08 NOTE — Progress Notes (Signed)
Subjective: [name] [name], a 38 year old male with a medical history significant for sickle cell anemia hemoglobin SS, stage III chronic kidney disease, glomerulonephritis, arachnoid cyst of bilateral brain, chronic tachycardia, history of polysubstance abuse, GERD, depression, and tobacco was admitted with influenza A in the presence of sickle cell crisis.  Patient reports that pain was improving until he lost IV access.  Has poor venous access.  Patient spoken fevers periodically overnight, max temperature 100.7 overnight. Hemoglobin decreased to 6.2, which is below baseline.  Patient also reports chest discomfort with increased coughing.  He denies shortness of breath, abdominal pain, nausea, vomiting, or diarrhea.   Objective:  Vital signs in last 24 hours:  Vitals:   03/08/18 1256 03/08/18 1301 03/08/18 1400 03/08/18 1504  BP:  111/78 120/80 104/68  Pulse:  (!) 121 (!) 119 (!) 118  Resp:    20  Temp:  (!) 102.2 F (39 C)  99.8 F (37.7 C)  TempSrc:    Oral  SpO2: 97% 100% 96% 96%  Weight:      Height:        Intake/Output from previous day:   Intake/Output Summary (Last 24 hours) at 03/08/2018 1555 Last data filed at 03/08/2018 0755 Gross per 24 hour  Intake 1319.4 ml  Output 750 ml  Net 569.4 ml   Physical Exam Constitutional:      Appearance: Normal appearance. He is ill-appearing.  HENT:     Nose: Nose normal.     Mouth/Throat:     Mouth: Mucous membranes are moist.  Eyes:     Pupils: Pupils are equal, round, and reactive to light.  Cardiovascular:     Rate and Rhythm: Normal rate and regular rhythm.     Pulses: Normal pulses.     Heart sounds: Normal heart sounds.  Pulmonary:     Effort: Pulmonary effort is normal.     Breath sounds: Normal breath sounds.  Abdominal:     General: Abdomen is flat. Bowel sounds are normal.  Musculoskeletal: Normal range of motion.  Skin:    General: Skin is warm and dry.  Neurological:     General: No focal deficit  present.     Mental Status: He is alert.  Psychiatric:        Mood and Affect: Mood normal.        Behavior: Behavior normal.        Thought Content: Thought content normal.        Judgment: Judgment normal.     Lab Results:  Basic Metabolic Panel:    Component Value Date/Time   NA 135 03/06/2018 1729   NA 138 06/13/2017 1102   K 4.1 03/06/2018 1729   CL 111 03/06/2018 1729   CO2 17 (L) 03/06/2018 1729   BUN 23 (H) 03/06/2018 1729   BUN 18 06/13/2017 1102   CREATININE 2.43 (H) 03/06/2018 1729   CREATININE 1.45 (H) 05/12/2014 1430   GLUCOSE 95 03/06/2018 1729   CALCIUM 8.3 (L) 03/06/2018 1729   CBC:    Component Value Date/Time   WBC 9.0 03/08/2018 0616   HGB 6.2 (LL) 03/08/2018 0616   HGB 7.3 (L) 06/13/2017 1102   HCT 18.1 (L) 03/08/2018 0616   HCT 22.5 (L) 06/13/2017 1102   PLT 166 03/08/2018 0616   PLT 455 (H) 06/13/2017 1102   MCV 140.3 (H) 03/08/2018 0616   MCV 96 06/13/2017 1102   NEUTROABS 3.6 03/06/2018 1729   NEUTROABS 4.9 06/13/2017 1102   LYMPHSABS 3.2 03/06/2018  1729   LYMPHSABS 4.4 (H) 06/13/2017 1102   MONOABS 0.7 03/06/2018 1729   EOSABS 0.1 03/06/2018 1729   EOSABS 0.2 06/13/2017 1102   BASOSABS 0.0 03/06/2018 1729   BASOSABS 0.0 06/13/2017 1102    Recent Results (from the past 240 hour(s))  Culture, blood (x 2)     Status: None (Preliminary result)   Collection Time: 03/07/18  3:29 AM  Result Value Ref Range Status   Specimen Description   Final    BLOOD BLOOD RIGHT WRIST Performed at [facility] [address] [place], [place] [address]    Special Requests   Final    BOTTLES DRAWN AEROBIC AND ANAEROBIC Blood Culture adequate volume Performed at [facility] [address], [place], [place] [address]    Culture   Final    NO GROWTH 1 DAY Performed at [facility], [place] [address] [place], [place] [address]    Report Status PENDING  Incomplete  Culture, blood (x 2)     Status: None  (Preliminary result)   Collection Time: 03/07/18  3:29 AM  Result Value Ref Range Status   Specimen Description   Final    BLOOD BLOOD LEFT FOREARM Performed at [facility] [address], [place], [place] [address]    Special Requests   Final    BOTTLES DRAWN AEROBIC AND ANAEROBIC Blood Culture adequate volume Performed at [facility] [address], [place], [place] [address]    Culture   Final    NO GROWTH 1 DAY Performed at [facility], [place] [address], [place],  [address]    Report Status PENDING  Incomplete    Studies/Results: Dg Chest 2 View  Result Date: 03/06/2018 CLINICAL DATA:  Chest pain and productive cough beginning yesterday. Sickle cell disease. EXAM: CHEST - 2 VIEW COMPARISON:  01/21/2018 FINDINGS: The heart size and mediastinal contours are within normal limits. Stable chronic pulmonary interstitial prominence. No evidence of acute infiltrate or edema. No evidence of pleural effusion. Chronic changes of avascular necrosis again seen involving the right humeral head. IMPRESSION: 1. Stable exam. No active cardiopulmonary disease. 2. 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 Electronically Signed   By: Earle Gell M.D.   On: 03/06/2018 17:51   [place] Ekg Site Rite  Result Date: 03/08/2018 If Site Rite image not attached, placement could not be confirmed due to current cardiac rhythm.   Medications: Scheduled Meds: . acetaminophen  650 mg Oral Once  . folic acid  1 mg Oral Daily  . HYDROmorphone   Intravenous Q4H  . hydroxyurea  1,500 mg Oral Daily  . nicotine  21 mg Transdermal Daily  . nortriptyline  25 mg Oral BID  . oseltamivir  30 mg Oral BID  . oxyCODONE  15 mg Oral Q12H  . pantoprazole  40 mg Oral BID  . senna-docusate  1 tablet Oral BID  . sodium chloride flush  10-40 mL Intracatheter Q12H   Continuous Infusions: . dextrose 5 % and 0.45% NaCl 125 mL/hr at 03/08/18 1535   PRN  Meds:.acetaminophen, dextromethorphan-guaiFENesin, diphenhydrAMINE, levalbuterol, naloxone **AND** sodium chloride flush, nitroGLYCERIN, ondansetron (ZOFRAN) IV, polyethylene glycol, sodium chloride flush, sodium chloride flush, sodium chloride flush Procedures:  Transfuse 1 unit of packed red blood cells  Assessment/Plan: Principal Problem:   Sickle cell pain crisis ([place]) Active Problems:   Sickle cell disease, type SS.    TOBACCO ABUSE   Tachycardia  Chest pain   Sickle-cell disease with pain (HCC)   GERD (gastroesophageal reflux disease)   CKD (chronic kidney disease), stage III (HCC)   Sepsis (HCC)   Hematemesis   Influenza A Sickle cell anemia with pain crisis: Continue D5 0.45% saline at 75 mL/h Continue Dilaudid PCA per weight-based protocol Hold all nephrotoxic medications Maintain oxygen saturation above 90%  Chest pain: Patient continues to report chest pain with increased coughing.  Most recent chest x-ray showed no acute cardiopulmonary process. Guaifenesin-dextromethorphan every 12 hours as needed for increased cough  Influenza A: Continue Tamiflu 30 mg every 12 hours Continue to follow blood cultures  Acute on chronic CKD stage III Baseline creatinine 2.2-2.4.  Gentle hydration Continue to follow BMP  Anemia of chronic disease: Hemoglobin decreased to 6.2, which is below baseline.  Patient typically transfused when hemoglobin drops below 6.  Transfuse 1 unit of packed red blood cells CBC to follow   Tobacco abuse: Patient counseled at length.  In pre-contemplative state  Fever:  Tylenol 650 mg every 6 hours as needed.  Follow blood cultures Continue Tamiflu for Influenza A   Code Status: Full Code Family Communication: N/A Disposition Plan: Not yet ready for discharge  [place], MSN, FNP-C Patient [facility] [address] [place], [place] [address] [phone]  If 5 right radial what role  PM-7AM, please contact night-coverage.  03/08/2018, 3:55 PM  LOS: 1 day

## 2018-03-08 NOTE — Progress Notes (Signed)
Peripherally Inserted Central Catheter/Midline Placement  The IV Nurse has discussed with the patient and/or persons authorized to consent for the patient, the purpose of this procedure and the potential benefits and risks involved with this procedure.  The benefits include less needle sticks, lab draws from the catheter, and the patient may be discharged home with the catheter. Risks include, but not limited to, infection, bleeding, blood clot (thrombus formation), and puncture of an artery; nerve damage and irregular heartbeat and possibility to perform a PICC exchange if needed/ordered by physician.  Alternatives to this procedure were also discussed.  B[facility] Power PICC patient education guide, fact sheet on infection prevention and patient information card has been provided to patient /or left at bedside.    PICC/Midline Placement Documentation        Darlyn Read 03/08/2018, 1:57 PM

## 2018-03-08 NOTE — Progress Notes (Signed)
CRITICAL VALUE ALERT  Critical Value:  HGB 6.5  Date & Time Notied:  03/08/18 @0209   Provider Notified: yes  Orders Received/Actions taken:

## 2018-03-08 NOTE — Progress Notes (Signed)
On call made aware of patient critical hgb and temp. No new orders received.

## 2018-03-09 LAB — BASIC METABOLIC PANEL
Anion gap: 3 — ABNORMAL LOW (ref 5–15)
BUN: 21 mg/dL — ABNORMAL HIGH (ref 6–20)
CALCIUM: 7.7 mg/dL — AB (ref 8.9–10.3)
CO2: 16 mmol/L — ABNORMAL LOW (ref 22–32)
Chloride: 112 mmol/L — ABNORMAL HIGH (ref 98–111)
Creatinine, Ser: 2.18 mg/dL — ABNORMAL HIGH (ref 0.61–1.24)
GFR calc Af Amer: 44 mL/min — ABNORMAL LOW (ref >60)
GFR, EST NON AFRICAN AMERICAN: 38 mL/min — AB (ref >60)
Glucose, Bld: 101 mg/dL — ABNORMAL HIGH (ref 70–99)
Potassium: 4.4 mmol/L (ref 3.5–5.1)
SODIUM: 131 mmol/L — AB (ref 135–145)

## 2018-03-09 LAB — TYPE AND SCREEN
ABO/RH(D): O POS
ANTIBODY SCREEN: NEGATIVE
Donor AG Type: NEGATIVE
Unit division: 0

## 2018-03-09 LAB — BPAM RBC
Blood Product Expiration Date: 202002192359
ISSUE DATE / TIME: 202001241556
Unit Type and Rh: 5100

## 2018-03-09 MED ORDER — GUAIFENESIN-DM 100-10 MG/5ML PO SYRP
10.0000 mL | ORAL_SOLUTION | ORAL | Status: DC
  Administered 2018-03-09 (×2): 10 mL via ORAL
  Filled 2018-03-09 (×5): qty 10

## 2018-03-09 NOTE — Progress Notes (Signed)
Patient ID: [name] [name]., male   DOB: 1980/12/20, 38 y.o.   MRN: [id number] Subjective: [name] [name], a 38 year old male with a medical history significant for sickle cell anemia hemoglobin SS, stage III chronic kidney disease, glomerulonephritis, arachnoid cyst of bilateral brain, chronic tachycardia, history of polysubstance abuse, GERD, depression, and tobacco was admitted with influenza A in the presence of sickle cell crisis.  He is still coughing, comp,aining of central chest pain associated with coughing, no SOB, no wheezes. S/P blood transfusion, Hb is now 8.0. Kidney function improving. No fever, no nausea or vomiting, no diarrhea.   Objective:  Vital signs in last 24 hours:  Vitals:   03/09/18 0745 03/09/18 0854 03/09/18 1000 03/09/18 1230  BP:  102/74 94/64   Pulse:  99 96   Resp: 13 20 20 16   Temp:  98.8 F (37.1 C) 98.6 F (37 C)   TempSrc:  Oral Oral   SpO2: 95% 99% 99% 98%  Weight:      Height:        Intake/Output from previous day:   Intake/Output Summary (Last 24 hours) at 03/09/2018 1233 Last data filed at 03/09/2018 0800 Gross per 24 hour  Intake 1973.78 ml  Output 1200 ml  Net 773.78 ml    Physical Exam: General: Alert, awake, oriented x3, in no acute distress.  HEENT: [place]/AT PEERL, EOMI Neck: Trachea midline,  no masses, no thyromegal,y no JVD, no carotid bruit OROPHARYNX:  Moist, No exudate/ erythema/lesions.  Heart: Regular rate and rhythm, without murmurs, rubs, gallops, PMI non-displaced, no heaves or thrills on palpation.  Lungs: Clear to auscultation, no wheezing or rhonchi noted. No increased vocal fremitus resonant to percussion  Abdomen: Soft, nontender, nondistended, positive bowel sounds, no masses no hepatosplenomegaly noted..  Neuro: No focal neurological deficits noted cranial nerves II through XII grossly intact. DTRs 2+ bilaterally upper and lower extremities. Strength 5 out of 5 in bilateral upper and lower  extremities. Musculoskeletal: No warm swelling or erythema around joints, no spinal tenderness noted. Psychiatric: Patient alert and oriented x3, good insight and cognition, good recent to remote recall. Lymph node survey: No cervical axillary or inguinal lymphadenopathy noted.  Lab Results:  Basic Metabolic Panel:    Component Value Date/Time   NA 131 (L) 03/09/2018 0339   NA 138 06/13/2017 1102   K 4.4 03/09/2018 0339   CL 112 (H) 03/09/2018 0339   CO2 16 (L) 03/09/2018 0339   BUN 21 (H) 03/09/2018 0339   BUN 18 06/13/2017 1102   CREATININE 2.18 (H) 03/09/2018 0339   CREATININE 1.45 (H) 05/12/2014 1430   GLUCOSE 101 (H) 03/09/2018 0339   CALCIUM 7.7 (L) 03/09/2018 0339   CBC:    Component Value Date/Time   WBC 8.5 03/08/2018 1552   HGB 8.0 (L) 03/08/2018 2130   HGB 7.3 (L) 06/13/2017 1102   HCT 23.1 (L) 03/08/2018 2130   HCT 22.5 (L) 06/13/2017 1102   PLT 171 03/08/2018 1552   PLT 455 (H) 06/13/2017 1102   MCV 137.3 (H) 03/08/2018 1552   MCV 96 06/13/2017 1102   NEUTROABS 3.6 03/06/2018 1729   NEUTROABS 4.9 06/13/2017 1102   LYMPHSABS 3.2 03/06/2018 1729   LYMPHSABS 4.4 (H) 06/13/2017 1102   MONOABS 0.7 03/06/2018 1729   EOSABS 0.1 03/06/2018 1729   EOSABS 0.2 06/13/2017 1102   BASOSABS 0.0 03/06/2018 1729   BASOSABS 0.0 06/13/2017 1102    Recent Results (from the past 240 hour(s))  Culture, blood (x 2)  Status: None (Preliminary result)   Collection Time: 03/07/18  3:29 AM  Result Value Ref Range Status   Specimen Description   Final    BLOOD BLOOD RIGHT WRIST Performed at [facility], [address], [place], [place] [address]    Special Requests   Final    BOTTLES DRAWN AEROBIC AND ANAEROBIC Blood Culture adequate volume Performed at [facility], [address] [place], [place] [address]    Culture   Final    NO GROWTH 2 DAYS Performed at [facility], [address] [place], [place] [address]     Report Status PENDING  Incomplete  Culture, blood (x 2)     Status: None (Preliminary result)   Collection Time: 03/07/18  3:29 AM  Result Value Ref Range Status   Specimen Description   Final    BLOOD BLOOD LEFT FOREARM Performed at [facility], [address] [place], [place] [address]    Special Requests   Final    BOTTLES DRAWN AEROBIC AND ANAEROBIC Blood Culture adequate volume Performed at [facility], [address], [place], [place] [address]    Culture   Final    NO GROWTH 2 DAYS Performed at [facility], [address], [place], [place] [address]    Report Status PENDING  Incomplete    Studies/Results: [place] Ekg Site Rite  Result Date: 03/08/2018 If Site Rite image not attached, placement could not be confirmed due to current cardiac rhythm.   Medications: Scheduled Meds: . folic acid  1 mg Oral Daily  . guaiFENesin-dextromethorphan  10 mL Oral Q4H  . HYDROmorphone   Intravenous Q4H  . hydroxyurea  1,500 mg Oral Daily  . nicotine  21 mg Transdermal Daily  . nortriptyline  25 mg Oral BID  . oseltamivir  30 mg Oral BID  . oxyCODONE  15 mg Oral Q12H  . pantoprazole  40 mg Oral BID  . senna-docusate  1 tablet Oral BID  . sodium chloride flush  10-40 mL Intracatheter Q12H   Continuous Infusions: . dextrose 5 % and 0.45% NaCl 10 mL/hr at 03/09/18 1230   PRN Meds:.acetaminophen, diphenhydrAMINE, levalbuterol, naloxone **AND** sodium chloride flush, nitroGLYCERIN, ondansetron (ZOFRAN) IV, polyethylene glycol, sodium chloride flush, sodium chloride flush, sodium chloride flush  Assessment/Plan: Principal Problem:   Sickle cell pain crisis (HCC) Active Problems:   Sickle cell disease, type SS.    TOBACCO ABUSE   Tachycardia   Chest pain   Sickle-cell disease with pain (HCC)   GERD (gastroesophageal reflux disease)   CKD (chronic kidney disease), stage III (HCC)   Sepsis (HCC)   Hematemesis   Influenza A  1. Hb  Sickle Cell Disease with crisis: Continue IVF D5 .45% Saline @ 100 mls/hour, continue weight based Dilaudid PCA, IV Toradol is contraindicated because of CKD III, Monitor vitals very closely, Re-evaluate pain scale regularly, 2 L of Oxygen by [place] to maintain Pulse Oxy >92%. 2. Chest Discomfort: CXR is negative for acute cardiopulmonary process, continue Robitussin as needed for cough. 3. Sickle Cell Anemia: Hemoglobin is now 8.0 which is above his baseline.  Patient is stable clinically, hemodynamically stable.  We will continue to monitor. 4. Chronic pain Syndrome: Continue home medications. Patient counseled extensively about pain and other non-pharmacologic means of pain management. 5. Influenza A infection: Continue Tamiflu 30 mg every 12 hours for total of 5 days.  Blood cultures negative so far. No indication for  antibiotics at the moment. 6. Acute on chronic kidney disease stage III: Creatinine now at baseline. Gentle hydration. Avoid nephrotoxins. 7. Tobacco abuse: Sebastian was counseled on the dangers of tobacco use, and was advised to quit. Reviewed strategies to maximize success, including removing cigarettes and smoking materials from environment, stress management and support of family/friends.  Code Status: Full Code Family Communication: N/A Disposition Plan: Not yet ready for discharge  Meeka Cartelli  If 7PM-7AM, please contact night-coverage.  03/09/2018, 12:33 PM  LOS: 2 days

## 2018-03-09 NOTE — Progress Notes (Signed)
MEWS Guidelines - (patients age 36 and over) Pt MEWS score in yellow after vitals resulted in a temp of 101.0 and pt tachycardic with a HR of 104. On call NP notified via text/page and PRN medication given. Charge nurse notified. Pt has no new complaints at this time and all other vitals stable. Will continue to monitor.    Red - At High Risk for Deterioration Yellow - At risk for Deterioration  1. Go to room and assess patient 2. Validate data. Is this patient's baseline? If data confirmed: 3. Is this an acute change? 4. Administer prn meds/treatments as ordered. 5. Note Sepsis score 6. Review goals of care 7. Sports coach, RRT nurse and Provider. 8. Ask Provider to come to bedside.  9. Document patient condition/interventions/response. 10. Increase frequency of vital signs and focused assessments to at least q15 minutes x 4, then q30 minutes x2. - If stable, then q1h x3, then q4h x3 and then q8h or dept. routine. - If unstable, contact Provider & RRT nurse. Prepare for possible transfer. 11. Add entry in progress notes using the smart phrase ".MEWS". 1. Go to room and assess patient 2. Validate data. Is this patient's baseline? If data confirmed: 3. Is this an acute change? 4. Administer prn meds/treatments as ordered? 5. Note Sepsis score 6. Review goals of care 7. Sports coach and Provider 8. Call RRT nurse as needed. 9. Document patient condition/interventions/response. 10. Increase frequency of vital signs and focused assessments to at least q2h x2. - If stable, then q4h x2 and then q8h or dept. routine. - If unstable, contact Provider & RRT nurse. Prepare for possible transfer. 11. Add entry in progress notes using the smart phrase ".MEWS".  Green - Likely stable Lavender - Comfort Care Only  1. Continue routine/ordered monitoring.  2. Review goals of care. 1. Continue routine/ordered monitoring. 2. Review goals of care.

## 2018-03-10 LAB — CBC WITH DIFFERENTIAL/PLATELET
Abs Immature Granulocytes: 0.02 10*3/uL (ref 0.00–0.07)
Basophils Absolute: 0 10*3/uL (ref 0.0–0.1)
Basophils Relative: 0 %
Eosinophils Absolute: 0.1 10*3/uL (ref 0.0–0.5)
Eosinophils Relative: 1 %
HCT: 21.5 % — ABNORMAL LOW (ref 39.0–52.0)
Hemoglobin: 7.5 g/dL — ABNORMAL LOW (ref 13.0–17.0)
Immature Granulocytes: 0 %
LYMPHS PCT: 51 %
Lymphs Abs: 3.8 10*3/uL (ref 0.7–4.0)
MCH: 40.5 pg — ABNORMAL HIGH (ref 26.0–34.0)
MCHC: 34.9 g/dL (ref 30.0–36.0)
MCV: 116.2 fL — ABNORMAL HIGH (ref 80.0–100.0)
Monocytes Absolute: 0.5 10*3/uL (ref 0.1–1.0)
Monocytes Relative: 6 %
Neutro Abs: 3.1 10*3/uL (ref 1.7–7.7)
Neutrophils Relative %: 42 %
PLATELETS: 144 10*3/uL — AB (ref 150–400)
RBC: 1.85 MIL/uL — ABNORMAL LOW (ref 4.22–5.81)
WBC: 7.5 10*3/uL (ref 4.0–10.5)
nRBC: 2.5 % — ABNORMAL HIGH (ref 0.0–0.2)

## 2018-03-10 LAB — COMPREHENSIVE METABOLIC PANEL
ALT: 56 U/L — ABNORMAL HIGH (ref 0–44)
AST: 76 U/L — ABNORMAL HIGH (ref 15–41)
Albumin: 2.3 g/dL — ABNORMAL LOW (ref 3.5–5.0)
Alkaline Phosphatase: 163 U/L — ABNORMAL HIGH (ref 38–126)
Anion gap: 4 — ABNORMAL LOW (ref 5–15)
BUN: 20 mg/dL (ref 6–20)
CO2: 15 mmol/L — ABNORMAL LOW (ref 22–32)
Calcium: 7.5 mg/dL — ABNORMAL LOW (ref 8.9–10.3)
Chloride: 115 mmol/L — ABNORMAL HIGH (ref 98–111)
Creatinine, Ser: 2.06 mg/dL — ABNORMAL HIGH (ref 0.61–1.24)
GFR calc Af Amer: 47 mL/min — ABNORMAL LOW (ref >60)
GFR calc non Af Amer: 41 mL/min — ABNORMAL LOW (ref >60)
GLUCOSE: 104 mg/dL — AB (ref 70–99)
POTASSIUM: 4.4 mmol/L (ref 3.5–5.1)
Sodium: 134 mmol/L — ABNORMAL LOW (ref 135–145)
Total Bilirubin: 1.4 mg/dL — ABNORMAL HIGH (ref 0.3–1.2)
Total Protein: 5.1 g/dL — ABNORMAL LOW (ref 6.5–8.1)

## 2018-03-10 NOTE — Progress Notes (Signed)
Patient ID: [name] Pick., male   DOB: 1980-07-03, 38 y.o.   MRN: [id number] Subjective: [name] [name], a 38 year old male with a medical history significant for sickle cell anemia hemoglobin SS, stage III chronic kidney disease, glomerulonephritis, arachnoid cyst of bilateral brain, chronic tachycardia, history of polysubstance abuse, GERD, depression, and tobacco was admitted with influenza A in the presence of sickle cell crisis.  He is said cough is better as well as associated chest pain. No fever, no SOB, no wheezes. Kidney function improving. Eating well, no nausea or vomiting, no diarrhea.   Objective:  Vital signs in last 24 hours:  Vitals:   03/10/18 0536 03/10/18 0816 03/10/18 1147 03/10/18 1237  BP:    97/60  Pulse:    86  Resp: 14 11 14 16   Temp:    98.2 F (36.8 C)  TempSrc:    Oral  SpO2: 100% 98% 100% 100%  Weight:      Height:        Intake/Output from previous day:   Intake/Output Summary (Last 24 hours) at 03/10/2018 1659 Last data filed at 03/10/2018 1237 Gross per 24 hour  Intake 2081.14 ml  Output 1975 ml  Net 106.14 ml    Physical Exam: General: Alert, awake, oriented x3, in no acute distress.  HEENT: [place]/AT PEERL, EOMI Neck: Trachea midline,  no masses, no thyromegal,y no JVD, no carotid bruit OROPHARYNX:  Moist, No exudate/ erythema/lesions.  Heart: Regular rate and rhythm, without murmurs, rubs, gallops, PMI non-displaced, no heaves or thrills on palpation.  Lungs: Clear to auscultation, no wheezing or rhonchi noted. No increased vocal fremitus resonant to percussion  Abdomen: Soft, nontender, nondistended, positive bowel sounds, no masses no hepatosplenomegaly noted..  Neuro: No focal neurological deficits noted cranial nerves II through XII grossly intact. DTRs 2+ bilaterally upper and lower extremities. Strength 5 out of 5 in bilateral upper and lower extremities. Musculoskeletal: No warm swelling or erythema around joints, no spinal  tenderness noted. Psychiatric: Patient alert and oriented x3, good insight and cognition, good recent to remote recall. Lymph node survey: No cervical axillary or inguinal lymphadenopathy noted.  Lab Results:  Basic Metabolic Panel:    Component Value Date/Time   NA 134 (L) 03/10/2018 1103   NA 138 06/13/2017 1102   K 4.4 03/10/2018 1103   CL 115 (H) 03/10/2018 1103   CO2 15 (L) 03/10/2018 1103   BUN 20 03/10/2018 1103   BUN 18 06/13/2017 1102   CREATININE 2.06 (H) 03/10/2018 1103   CREATININE 1.45 (H) 05/12/2014 1430   GLUCOSE 104 (H) 03/10/2018 1103   CALCIUM 7.5 (L) 03/10/2018 1103   CBC:    Component Value Date/Time   WBC 7.5 03/10/2018 1103   HGB 7.5 (L) 03/10/2018 1103   HGB 7.3 (L) 06/13/2017 1102   HCT 21.5 (L) 03/10/2018 1103   HCT 22.5 (L) 06/13/2017 1102   PLT 144 (L) 03/10/2018 1103   PLT 455 (H) 06/13/2017 1102   MCV 116.2 (H) 03/10/2018 1103   MCV 96 06/13/2017 1102   NEUTROABS 3.1 03/10/2018 1103   NEUTROABS 4.9 06/13/2017 1102   LYMPHSABS 3.8 03/10/2018 1103   LYMPHSABS 4.4 (H) 06/13/2017 1102   MONOABS 0.5 03/10/2018 1103   EOSABS 0.1 03/10/2018 1103   EOSABS 0.2 06/13/2017 1102   BASOSABS 0.0 03/10/2018 1103   BASOSABS 0.0 06/13/2017 1102    Recent Results (from the past 240 hour(s))  Culture, blood (x 2)     Status: None (Preliminary result)  Collection Time: 03/07/18  3:29 AM  Result Value Ref Range Status   Specimen Description   Final    BLOOD BLOOD RIGHT WRIST Performed at [facility], [address], [place], [place] [address]    Special Requests   Final    BOTTLES DRAWN AEROBIC AND ANAEROBIC Blood Culture adequate volume Performed at [facility], [address], [place], [place] [address]    Culture   Final    NO GROWTH 3 DAYS Performed at [facility], [address], [place], [place] [address]    Report Status PENDING  Incomplete  Culture, blood (x 2)     Status: None (Preliminary  result)   Collection Time: 03/07/18  3:29 AM  Result Value Ref Range Status   Specimen Description   Final    BLOOD BLOOD LEFT FOREARM Performed at [facility], [address] [place], [place] [address]    Special Requests   Final    BOTTLES DRAWN AEROBIC AND ANAEROBIC Blood Culture adequate volume Performed at [facility], [address], [place], [place] [address]    Culture   Final    NO GROWTH 3 DAYS Performed at [facility], [address], [place], [place] [address]    Report Status PENDING  Incomplete    Studies/Results: No results found.  Medications: Scheduled Meds: . folic acid  1 mg Oral Daily  . guaiFENesin-dextromethorphan  10 mL Oral Q4H  . HYDROmorphone   Intravenous Q4H  . hydroxyurea  1,500 mg Oral Daily  . nicotine  21 mg Transdermal Daily  . nortriptyline  25 mg Oral BID  . oseltamivir  30 mg Oral BID  . oxyCODONE  15 mg Oral Q12H  . pantoprazole  40 mg Oral BID  . senna-docusate  1 tablet Oral BID  . sodium chloride flush  10-40 mL Intracatheter Q12H   Continuous Infusions: . dextrose 5 % and 0.45% NaCl 10 mL/hr at 03/10/18 1145   PRN Meds:.acetaminophen, diphenhydrAMINE, levalbuterol, naloxone **AND** sodium chloride flush, nitroGLYCERIN, ondansetron (ZOFRAN) IV, polyethylene glycol, sodium chloride flush, sodium chloride flush, sodium chloride flush  Assessment/Plan: Principal Problem:   Sickle cell pain crisis (HCC) Active Problems:   Sickle cell disease, type SS.    TOBACCO ABUSE   Tachycardia   Chest pain   Sickle-cell disease with pain (HCC)   GERD (gastroesophageal reflux disease)   CKD (chronic kidney disease), stage III (HCC)   Sepsis (HCC)   Hematemesis   Influenza A  1. Hb Sickle Cell Disease with crisis: Reduce IVF D5 .45% Saline to [facility], continue weight based Dilaudid PCA, IV Toradol is contraindicated because of CKD III, Monitor vitals very closely, Re-evaluate pain scale regularly,  2 L of Oxygen by [place] to maintain Pulse Oxy >92%. 2. Chest Discomfort: Resolving. CXR is negative for acute cardiopulmonary process, continue Robitussin as needed for cough. 3. Sickle Cell Anemia: Hemoglobin is now 7.4 which is around his baseline. Patient is stable clinically, hemodynamically stable. We will continue to monitor. 4. Chronic pain Syndrome: Continue home medications. Patient counseled extensively about pain and other non-pharmacologic means of pain management. 5. Influenza A infection: Continue Tamiflu. Blood cultures negative so far. No indication for antibiotics at the moment. 6. Acute on chronic kidney disease stage III: Creatinine now at baseline. Gentle hydration. Avoid nephrotoxins. 7. Tobacco abuse: Demareo was counseled on the dangers of tobacco use, and was advised to quit. Reviewed strategies to maximize  success, including removing cigarettes and smoking materials from environment, stress management and support of family/friends.  Code Status: Full Code Family Communication: N/A Disposition Plan: Not yet ready for discharge  Aramis Weil  If 7PM-7AM, please contact night-coverage.  03/10/2018, 4:59 PM  LOS: 3 days

## 2018-03-11 ENCOUNTER — Other Ambulatory Visit: Payer: Self-pay | Admitting: Family Medicine

## 2018-03-11 DIAGNOSIS — A419 Sepsis, unspecified organism: Principal | ICD-10-CM

## 2018-03-11 MED ORDER — OXYCODONE HCL 10 MG PO TABS: 10.0000 mg | ORAL_TABLET | Freq: Four times a day (QID) | ORAL | 0 refills | Status: AC | PRN

## 2018-03-11 MED ORDER — OSELTAMIVIR PHOSPHATE 30 MG PO CAPS: 30.0000 mg | ORAL_CAPSULE | Freq: Two times a day (BID) | ORAL | 0 refills | Status: DC

## 2018-03-11 MED ORDER — OXYCODONE HCL 10 MG PO TABS: 10.0000 mg | ORAL_TABLET | Freq: Four times a day (QID) | ORAL | 0 refills | Status: DC | PRN

## 2018-03-11 NOTE — Discharge Instructions (Signed)
During your admission, you had influenza A.  You will complete treatment with Tamiflu 30 mg for 2 more doses.  The medication is been sent to your pharmacy.  Also, complete sickle cell crisis management with oxycodone 10 mg every 6 hours as needed for moderate to severe pain. I recommend that you follow-up with primary care in 1 week and have your labs redrawn. Your laboratory values have improved throughout your admission.   Sickle Cell Anemia, Adult Sickle cell anemia is a condition where your red blood cells are shaped like sickles. Red blood cells carry oxygen through the body. Sickle-shaped cells do not live as long as normal red blood cells. They also clump together and block blood from flowing through the blood vessels. This prevents the body from getting enough oxygen. Sickle cell anemia causes organ damage and pain. It also increases the risk of infection. Follow these instructions at home: Medicines  Take over-the-counter and prescription medicines only as told by your doctor.  If you were prescribed an antibiotic medicine, take it as told by your doctor. Do not stop taking the antibiotic even if you start to feel better.  If you develop a fever, do not take medicines to lower the fever right away. Tell your doctor about the fever. Managing pain, stiffness, and swelling  Try these methods to help with pain: ? Use a heating pad. ? Take a warm bath. ? Distract yourself, such as by watching TV. Eating and drinking  Drink enough fluid to keep your pee (urine) clear or pale yellow. Drink more in hot weather and during exercise.  Limit or avoid alcohol.  Eat a healthy diet. Eat plenty of fruits, vegetables, whole grains, and lean protein.  Take vitamins and supplements as told by your doctor. Traveling  When traveling, keep these with you: ? Your medical information. ? The names of your doctors. ? Your medicines.  If you need to take an airplane, talk to your doctor  first. Activity  Rest often.  Avoid exercises that make your heart beat much faster, such as jogging. General instructions  Do not use products that have nicotine or tobacco, such as cigarettes and e-cigarettes. If you need help quitting, ask your doctor.  Consider wearing a medical alert bracelet.  Avoid being in high places (high altitudes), such as mountains.  Avoid very hot or cold temperatures.  Avoid places where the temperature changes a lot.  Keep all follow-up visits as told by your doctor. This is important. Contact a doctor if:  A joint hurts.  Your feet or hands hurt or swell.  You feel tired (fatigued). Get help right away if:  You have symptoms of infection. These include: ? Fever. ? Chills. ? Being very tired. ? Irritability. ? Poor eating. ? Throwing up (vomiting).  You feel dizzy or faint.  You have new stomach pain, especially on the left side.  You have a an erection (priapism) that lasts more than 4 hours.  You have numbness in your arms or legs.  You have a hard time moving your arms or legs.  You have trouble talking.  You have pain that does not go away when you take medicine.  You are short of breath.  You are breathing fast.  You have a long-term cough.  You have pain in your chest.  You have a bad headache.  You have a stiff neck.  Your stomach looks bloated even though you did not eat much.  Your skin is pale.  You suddenly cannot see well. Summary  Sickle cell anemia is a condition where your red blood cells are shaped like sickles.  Follow your doctor's advice on ways to manage pain, food to eat, activities to do, and steps to take for safe travel.  Get medical help right away if you have any signs of infection, such as a fever. This information is not intended to replace advice given to you by your health care provider. Make sure you discuss any questions you have with your health care provider. Document  Released: 11/20/2012 Document Revised: 03/07/2016 Document Reviewed: 03/07/2016 Elsevier Interactive Patient Education  2019 [employer].  Influenza, Adult Influenza is also called "the flu." It is an infection in the lungs, nose, and throat (respiratory tract). It is caused by a virus. The flu causes symptoms that are similar to symptoms of a cold. It also causes a high fever and body aches. The flu spreads easily from person to person (is contagious). Getting a flu shot (influenza vaccination) every year is the best way to prevent the flu. What are the causes? This condition is caused by the influenza virus. You can get the virus by:  Breathing in droplets that are in the air from the cough or sneeze of a person who has the virus.  Touching something that has the virus on it (is contaminated) and then touching your mouth, nose, or eyes. What increases the risk? Certain things may make you more likely to get the flu. These include:  Not washing your hands often.  Having close contact with many people during cold and flu season.  Touching your mouth, eyes, or nose without first washing your hands.  Not getting a flu shot every year. You may have a higher risk for the flu, along with serious problems such as a lung infection (pneumonia), if you:  Are older than 65.  Are pregnant.  Have a weakened disease-fighting system (immune system) because of a disease or taking certain medicines.  Have a long-term (chronic) illness, such as: ? Heart, kidney, or lung disease. ? Diabetes. ? Asthma.  Have a liver disorder.  Are very overweight (morbidly obese).  Have anemia. This is a condition that affects your red blood cells. What are the signs or symptoms? Symptoms usually begin suddenly and last 4-14 days. They may include:  Fever and chills.  Headaches, body aches, or muscle aches.  Sore throat.  Cough.  Runny or stuffy (congested) nose.  Chest discomfort.  Not wanting  to eat as much as normal (poor appetite).  Weakness or feeling tired (fatigue).  Dizziness.  Feeling sick to your stomach (nauseous) or throwing up (vomiting). How is this treated? If the flu is found early, you can be treated with medicine that can help reduce how bad the illness is and how long it lasts (antiviral medicine). This may be given by mouth (orally) or through an IV tube. Taking care of yourself at home can help your symptoms get better. Your doctor may suggest:  Taking over-the-counter medicines.  Drinking plenty of fluids. The flu often goes away on its own. If you have very bad symptoms or other problems, you may be treated in a hospital. Follow these instructions at home:     Activity  Rest as needed. Get plenty of sleep.  Stay home from work or school as told by your doctor. ? Do not leave home until you do not have a fever for 24 hours without taking medicine. ? Leave home  only to visit your doctor. Eating and drinking  Take an ORS (oral rehydration solution). This is a drink that is sold at pharmacies and stores.  Drink enough fluid to keep your pee (urine) pale yellow.  Drink clear fluids in small amounts as you are able. Clear fluids include: ? Water. ? Ice chips. ? Fruit juice that has water added (diluted fruit juice). ? Low-calorie sports drinks.  Eat bland, easy-to-digest foods in small amounts as you are able. These foods include: ? Bananas. ? Applesauce. ? Rice. ? Lean meats. ? Toast. ? Crackers.  Do not eat or drink: ? Fluids that have a lot of sugar or caffeine. ? Alcohol. ? Spicy or fatty foods. General instructions  Take over-the-counter and prescription medicines only as told by your doctor.  Use a cool mist humidifier to add moisture to the air in your home. This can make it easier for you to breathe.  Cover your mouth and nose when you cough or sneeze.  Wash your hands with soap and water often, especially after you cough or  sneeze. If you cannot use soap and water, use alcohol-based hand sanitizer.  Keep all follow-up visits as told by your doctor. This is important. How is this prevented?   Get a flu shot every year. You may get the flu shot in late summer, fall, or winter. Ask your doctor when you should get your flu shot.  Avoid contact with people who are sick during fall and winter (cold and flu season). Contact a doctor if:  You get new symptoms.  You have: ? Chest pain. ? Watery poop (diarrhea). ? A fever.  Your cough gets worse.  You start to have more mucus.  You feel sick to your stomach.  You throw up. Get help right away if you:  Have shortness of breath.  Have trouble breathing.  Have skin or nails that turn a bluish color.  Have very bad pain or stiffness in your neck.  Get a sudden headache.  Get sudden pain in your face or ear.  Cannot eat or drink without throwing up. Summary  Influenza ("the flu") is an infection in the lungs, nose, and throat. It is caused by a virus.  Take over-the-counter and prescription medicines only as told by your doctor.  Getting a flu shot every year is the best way to avoid getting the flu. This information is not intended to replace advice given to you by your health care provider. Make sure you discuss any questions you have with your health care provider. Document Released: 11/09/2007 Document Revised: 07/18/2017 Document Reviewed: 07/18/2017 Elsevier Interactive Patient Education  2019 [employer].

## 2018-03-11 NOTE — Progress Notes (Signed)
Will need to be seen weekly for medications due to significant hx for cocaine use. Patient given 5 days of medications on hospital discharge on 03/11/2018. Will be due for medication refills on March 16, 2018.

## 2018-03-11 NOTE — Progress Notes (Signed)
Pt PCA discontinued per order, wasted Dilaudid 57ml in Stericycle witnessed by Sunday Corn, RN. PICC line discontinued by IV team, pt tol well. SRP,RN

## 2018-03-11 NOTE — Care Management Important Message (Signed)
Important Message  Patient Details  Name: [name] [name]. MRN: [id number] Date of Birth: [birth year]   Medicare Important Message Given:  Yes    Kerin Salen 03/11/2018, 11:07 AMImportant Message  Patient Details  Name: [name] [name]. MRN: [id number] Date of Birth: 12-29-1980   Medicare Important Message Given:  Yes    Kerin Salen 03/11/2018, 11:07 AM

## 2018-03-11 NOTE — Discharge Summary (Signed)
Physician Discharge Summary  [name] [name]. JOA:[id number] DOB: 1980-10-28 DOA: 03/06/2018  PCP: Lanae Boast, FNP  Admit date: 03/06/2018  Discharge date: 03/11/2018  Discharge Diagnoses:  Principal Problem:   Sickle cell pain crisis ([place]) Active Problems:   Sickle cell disease, type SS.    TOBACCO ABUSE   Tachycardia   Chest pain   Sickle-cell disease with pain (HCC)   GERD (gastroesophageal reflux disease)   CKD (chronic kidney disease), stage III ([place])   Sepsis ([place])   Hematemesis   Influenza A   Discharge Condition: Stable  Disposition:  Pt is discharged home in good condition and is to follow up with Lanae Boast, FNP this week to have labs evaluated. [name] A Vidrio Jr. is instructed to increase activity slowly and balance with rest for the next few days, and use prescribed medication to complete treatment of pain  Diet: Regular Wt Readings from Last 3 Encounters:  03/06/18 59 kg  02/04/18 58.5 kg  01/21/18 53.6 kg    History of present illness:  [name] [name], a 38 year old male with a medical history significant for sickle cell anemia, chronic pain syndrome, CKD 3, glomerulonephritis, arachnoid cyst of bilateral brain, chronic tachycardia, history of tobacco dependence, history of marijuana abuse, abnormal liver function, GERD, and depression presented to the emergency room complaining of pain all over for 1 day.  Patient also endorses chest pain, that involves the entire chest.  Pain intensity is 10/10 in severity, sharp, nonradiating.  Pain is pleuritic, aggravated by coughing.  Patient also has mild shortness of breath and cough.  He also endorses fever and chills.  Patient reports runny nose, sore throat, and coughs up some blood.  Patient also states that he experience nausea and vomiting twice.  Patient denies abdominal pain, diarrhea dysuria, calf tenderness, or unilateral weakness.  Patient had a colonoscopy on 04/23/2017 by Dr. Henrene Pastor, which was  negative.  ER course: Patient found to have WBCs 7.4, stable hemoglobin 7.8, positive UDS for THC, negative urinalysis, stable renal functioning, temperature 100.1 F, tachycardia, tachypnea, oxygen saturation 97% on RA.  Chest x-ray negative for acute cardiopulmonary process. Patient admitted to telemetry bed as inpatient.   Hospital Course:  Ms. Gelpi was admitted with sickle cell crisis in the presence of influenza A. Patient was treated with Tamiflu, which was reduced to 30 mg twice daily due to history of chronic kidney disease stage III.  Patient has taken Tamiflu consistently throughout admission and will complete treatment with medication.  Tamiflu No. 2 is been sent to pharmacy.  Also, discussed the importance of supportive care.  He can continue guaifenesin as prescribed for persistent cough.  Also, recommend the patient continues to hydrate consistently.  Sickle cell pain crisis: Patient was treated appropriately with IVF, Tylenol, and PCA per weight-based protocol.  PCA was weaned appropriately and home opiate medications were restarted.  Patient is out of home medications.  He will discharge home with oxycodone 10 mg every 6 hours #20.  [place] substance abuse database was reviewed prior to prescribing medications, no inconsistencies noted.  Patient advised to follow-up in primary care for further medication management.  Also, continue folic acid as prescribed.  Patient has a history of marijuana use: Discussed the importance of refraining from illicit drugs especially while taking opiate medications.  Patient is not ready to quit and was reluctant to discuss further. Patient also has a history of tobacco dependence.  He also states that he is not ready to quit smoking  and care not to discuss.  Stage III chronic kidney disease: Creatinine on admission 2.5, which was increased from baseline.  Creatinine decreased to 2.06.  Patient advised to follow-up with nephrology as  scheduled.  Also, will repeat labs during primary care follow-up.  At discharge, patient's pain intensity is 5/10.  Patient is alert, oriented, and ambulating without assistance.  He is very anxious to leave.  He is currently awaiting PICC line removal. Patient will discharge home in a hemodynamically stable condition.  He is also aware of all follow-up appointments.  Discharge Exam: Vitals:   03/11/18 0800 03/11/18 1151  BP:    Pulse:    Resp: 14 13  Temp:    SpO2: 100% 98%   Vitals:   03/11/18 0502 03/11/18 0519 03/11/18 0800 03/11/18 1151  BP:  93/62    Pulse:  83    Resp: 15 14 14 13   Temp:  98.9 F (37.2 C)    TempSrc:  Oral    SpO2: 98% 100% 100% 98%  Weight:      Height:       Physical Exam Constitutional:      Appearance: He is normal weight.  HENT:     Head: Normocephalic.     Nose: Nose normal.     Mouth/Throat:     Mouth: Mucous membranes are moist.  Neck:     Musculoskeletal: Normal range of motion and neck supple.  Cardiovascular:     Rate and Rhythm: Normal rate and regular rhythm.  Pulmonary:     Effort: Pulmonary effort is normal.     Breath sounds: Normal breath sounds.  Abdominal:     General: Abdomen is flat.     Palpations: Abdomen is soft.  Musculoskeletal: Normal range of motion.  Skin:    General: Skin is warm and dry.  Neurological:     General: No focal deficit present.     Mental Status: He is alert.  Psychiatric:        Mood and Affect: Mood normal.        Behavior: Behavior normal.        Thought Content: Thought content normal.        Judgment: Judgment normal.     Discharge Instructions   Allergies as of 03/11/2018      Reactions   Nsaids Other (See Comments)   Kidney disease   Morphine And Related Other (See Comments)   "real bad headaches"      Medication List    TAKE these medications   ergocalciferol 1.25 MG (50000 UT) capsule Commonly known as:  DRISDOL Take 1 capsule (50,000 Units total) by mouth once a  week.   folic acid 1 MG tablet Commonly known as:  FOLVITE Take 1 tablet (1 mg total) daily by mouth.   gabapentin 300 MG capsule Commonly known as:  NEURONTIN Take 1 capsule (300 mg total) by mouth 3 (three) times daily.   hydroxyurea 500 MG capsule Commonly known as:  HYDREA TAKE 3 CAPSULES (1,500 MG TOTAL) BY MOUTH DAILY. What changed:  See the new instructions.   ibuprofen 200 MG tablet Commonly known as:  ADVIL,MOTRIN Take 600 mg by mouth daily as needed (severe pain).   nortriptyline 25 MG capsule Commonly known as:  PAMELOR TAKE 1 CAPSULE (25 MG TOTAL) BY MOUTH AT BEDTIME. What changed:  See the new instructions.   oseltamivir 30 MG capsule Commonly known as:  TAMIFLU Take 1 capsule (30 mg total) by mouth 2 (  two) times daily.   OXYCONTIN 15 mg 12 hr tablet Generic drug:  oxyCODONE Take 15 mg by mouth every 12 (twelve) hours.   Oxycodone HCl 10 MG Tabs Take 1 tablet (10 mg total) by mouth every 6 (six) hours as needed (pain).       The results of significant diagnostics from this hospitalization (including imaging, microbiology, ancillary and laboratory) are listed below for reference.    Significant Diagnostic Studies: Dg Chest 2 View  Result Date: 03/06/2018 CLINICAL DATA:  Chest pain and productive cough beginning yesterday. Sickle cell disease. EXAM: CHEST - 2 VIEW COMPARISON:  01/21/2018 FINDINGS: The heart size and mediastinal contours are within normal limits. Stable chronic pulmonary interstitial prominence. No evidence of acute infiltrate or edema. No evidence of pleural effusion. Chronic changes of avascular necrosis again seen involving the right humeral head. IMPRESSION: 1. Stable exam. No active cardiopulmonary disease. 2. 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 Electronically Signed   By: Earle Gell M.D.   On: 03/06/2018 17:51   [place] Ekg Site Rite  Result Date: 03/08/2018 If Site Rite image not attached, placement could not  be confirmed due to current cardiac rhythm.   Microbiology: Recent Results (from the past 240 hour(s))  Culture, blood (x 2)     Status: None (Preliminary result)   Collection Time: 03/07/18  3:29 AM  Result Value Ref Range Status   Specimen Description   Final    BLOOD BLOOD RIGHT WRIST Performed at [facility] [address] [place], [place] [address]    Special Requests   Final    BOTTLES DRAWN AEROBIC AND ANAEROBIC Blood Culture adequate volume Performed at [facility] [address], [place], [place] [address]    Culture   Final    NO GROWTH 3 DAYS Performed at [facility], [place] [address] [place], [place] [address]    Report Status PENDING  Incomplete  Culture, blood (x 2)     Status: None (Preliminary result)   Collection Time: 03/07/18  3:29 AM  Result Value Ref Range Status   Specimen Description   Final    BLOOD BLOOD LEFT FOREARM Performed at [facility] [address] [place], [place] [address]    Special Requests   Final    BOTTLES DRAWN AEROBIC AND ANAEROBIC Blood Culture adequate volume Performed at [facility] [address], [place], [place] [address]    Culture   Final    NO GROWTH 3 DAYS Performed at [facility], [place] [address], [name], [place] [address]    Report Status PENDING  Incomplete     Labs: Basic Metabolic Panel: Recent Labs  Lab 03/06/18 1729 03/09/18 0339 03/10/18 1103  NA 135 131* 134*  K 4.1 4.4 4.4  CL 111 112* 115*  CO2 17* 16* 15*  GLUCOSE 95 101* 104*  BUN 23* 21* 20  CREATININE 2.43* 2.18* 2.06*  CALCIUM 8.3* 7.7* 7.5*   Liver Function Tests: Recent Labs  Lab 03/06/18 1729 03/10/18 1103  AST 202* 76*  ALT 117* 56*  ALKPHOS 188* 163*  BILITOT 1.4* 1.4*  PROT 6.4* 5.1*  ALBUMIN 3.1* 2.3*   No results for input(s): LIPASE, AMYLASE in the last 168 hours. No results for input(s): AMMONIA in the last 168  hours. CBC: Recent Labs  Lab 03/06/18 1729  03/07/18 2037 03/08/18 0142 03/08/18 0616 03/08/18 1552 03/08/18 2130 03/10/18 1103  WBC  7.4   < > 8.4 10.2 9.0 8.5  --  7.5  NEUTROABS 3.6  --   --   --   --   --   --  3.1  HGB 7.8*   < > 8.2* 6.5* 6.2* 6.1* 8.0* 7.5*  HCT 22.9*   < > 22.9* 18.7* 18.1* 17.3* 23.1* 21.5*  MCV 145.9*   < > 134.7* 139.6* 140.3* 137.3*  --  116.2*  PLT 214   < > 151 178 166 171  --  144*   < > = values in this interval not displayed.   Cardiac Enzymes: Recent Labs  Lab 03/07/18 0329 03/07/18 0759 03/07/18 1636  TROPONINI <0.03 <0.03 0.16*   BNP: Invalid input(s): POCBNP CBG: No results for input(s): GLUCAP in the last 168 hours.  Time coordinating discharge: 30 minutes  Signed:  [name]  APRN, MSN, FNP-C Patient [facility] [address] [place], [place] [address] [phone]  [facility] 03/11/2018, 12:05 PM

## 2018-03-12 LAB — CULTURE, BLOOD (ROUTINE X 2)
Culture: NO GROWTH
Culture: NO GROWTH
Special Requests: ADEQUATE
Special Requests: ADEQUATE

## 2018-03-15 ENCOUNTER — Telehealth: Payer: Self-pay

## 2018-03-19 NOTE — Telephone Encounter (Signed)
Patient Is scheduled for 03/22/2018. Thanks!

## 2018-03-21 IMAGING — CR DG CHEST 2V
2 series · 2 of 2 positions shown · non-contrast
Comparison: 03/11/2015

CLINICAL DATA: Sickle-cell crisis, chest pain, shortness of breath,
tachycardia

EXAM:
CHEST  2 VIEW

[w chest pa]
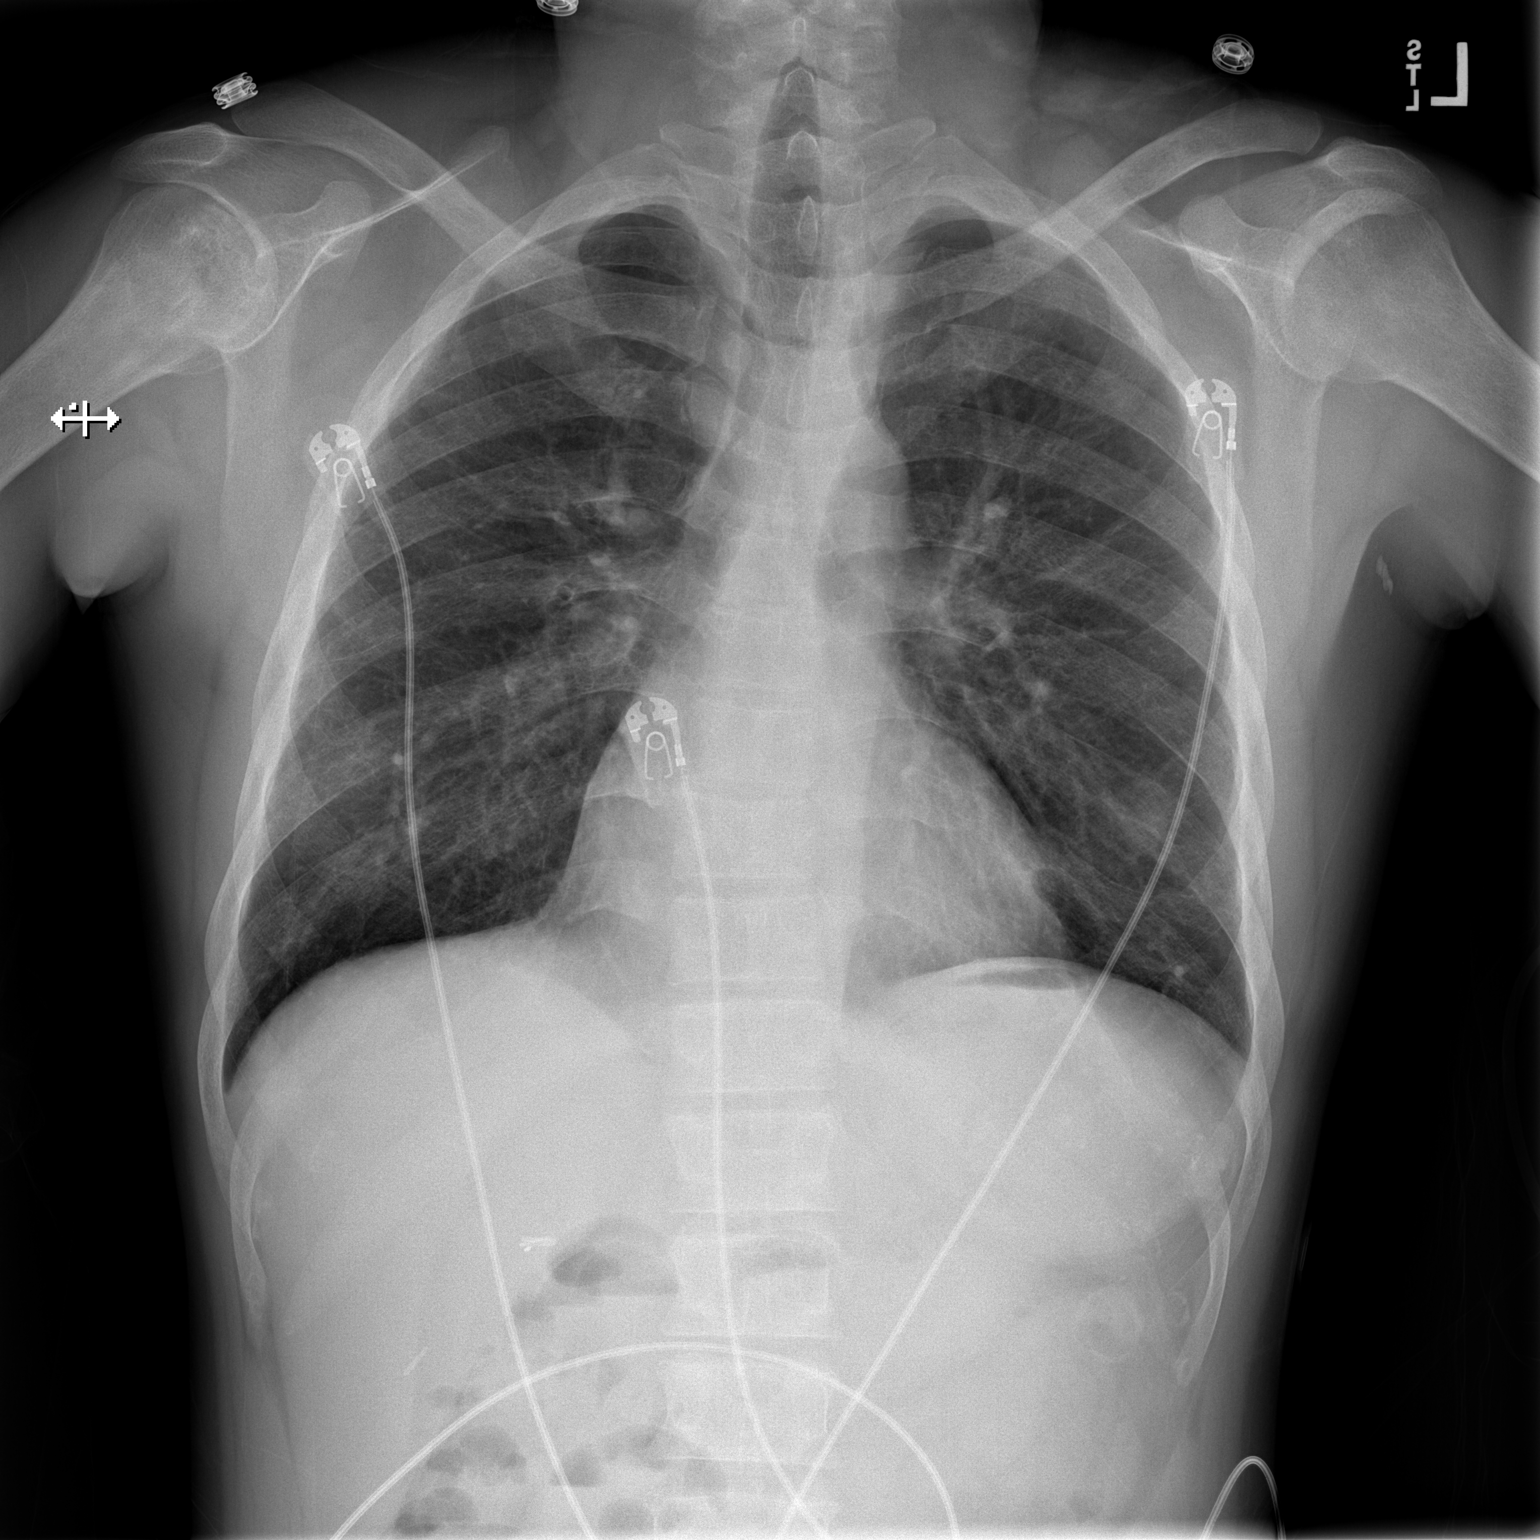

[w chest lat]
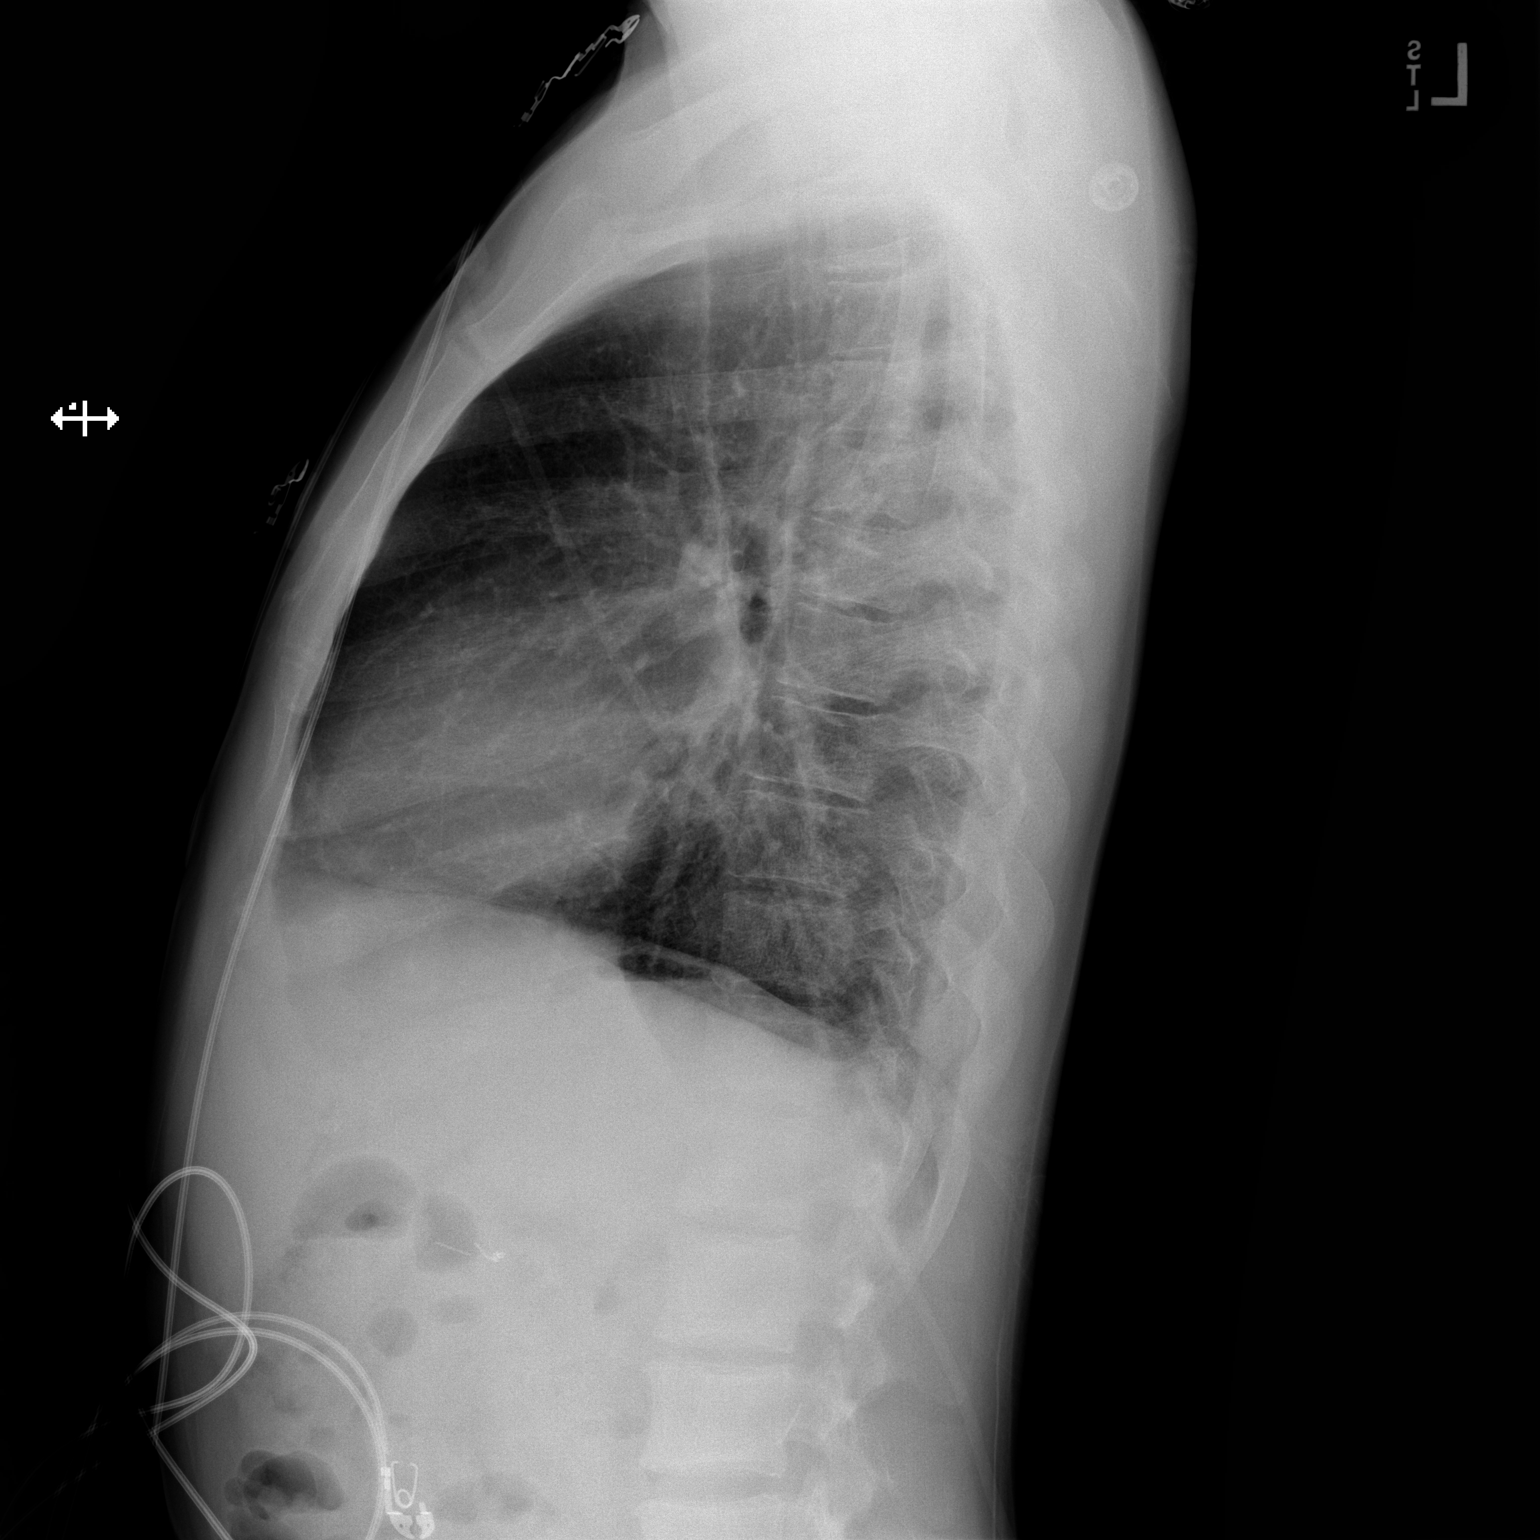

[2 of 2 positions shown; findings below may reference images not displayed]

FINDINGS: Normal heart size. Stable chronic bronchitic change and mild
interstitial prominence without superimposed pneumonia, collapse or
consolidation. Negative for edema, effusion or pneumothorax. Trachea
is midline. Remote cholecystectomy noted. Stable osseous changes of
chronic sickle-cell disease.
IMPRESSION: Stable chest exam.  No superimposed acute process.

## 2018-03-22 ENCOUNTER — Ambulatory Visit: Payer: Medicare Other | Admitting: Family Medicine

## 2018-03-23 IMAGING — DX DG CHEST 2V
2 series · 2 of 2 positions shown · non-contrast
Comparison: 10/14/2016

CLINICAL DATA: Chest pain. Bilateral leg pain. History of sickle
cell anemia

EXAM:
CHEST  2 VIEW

[chest lat]
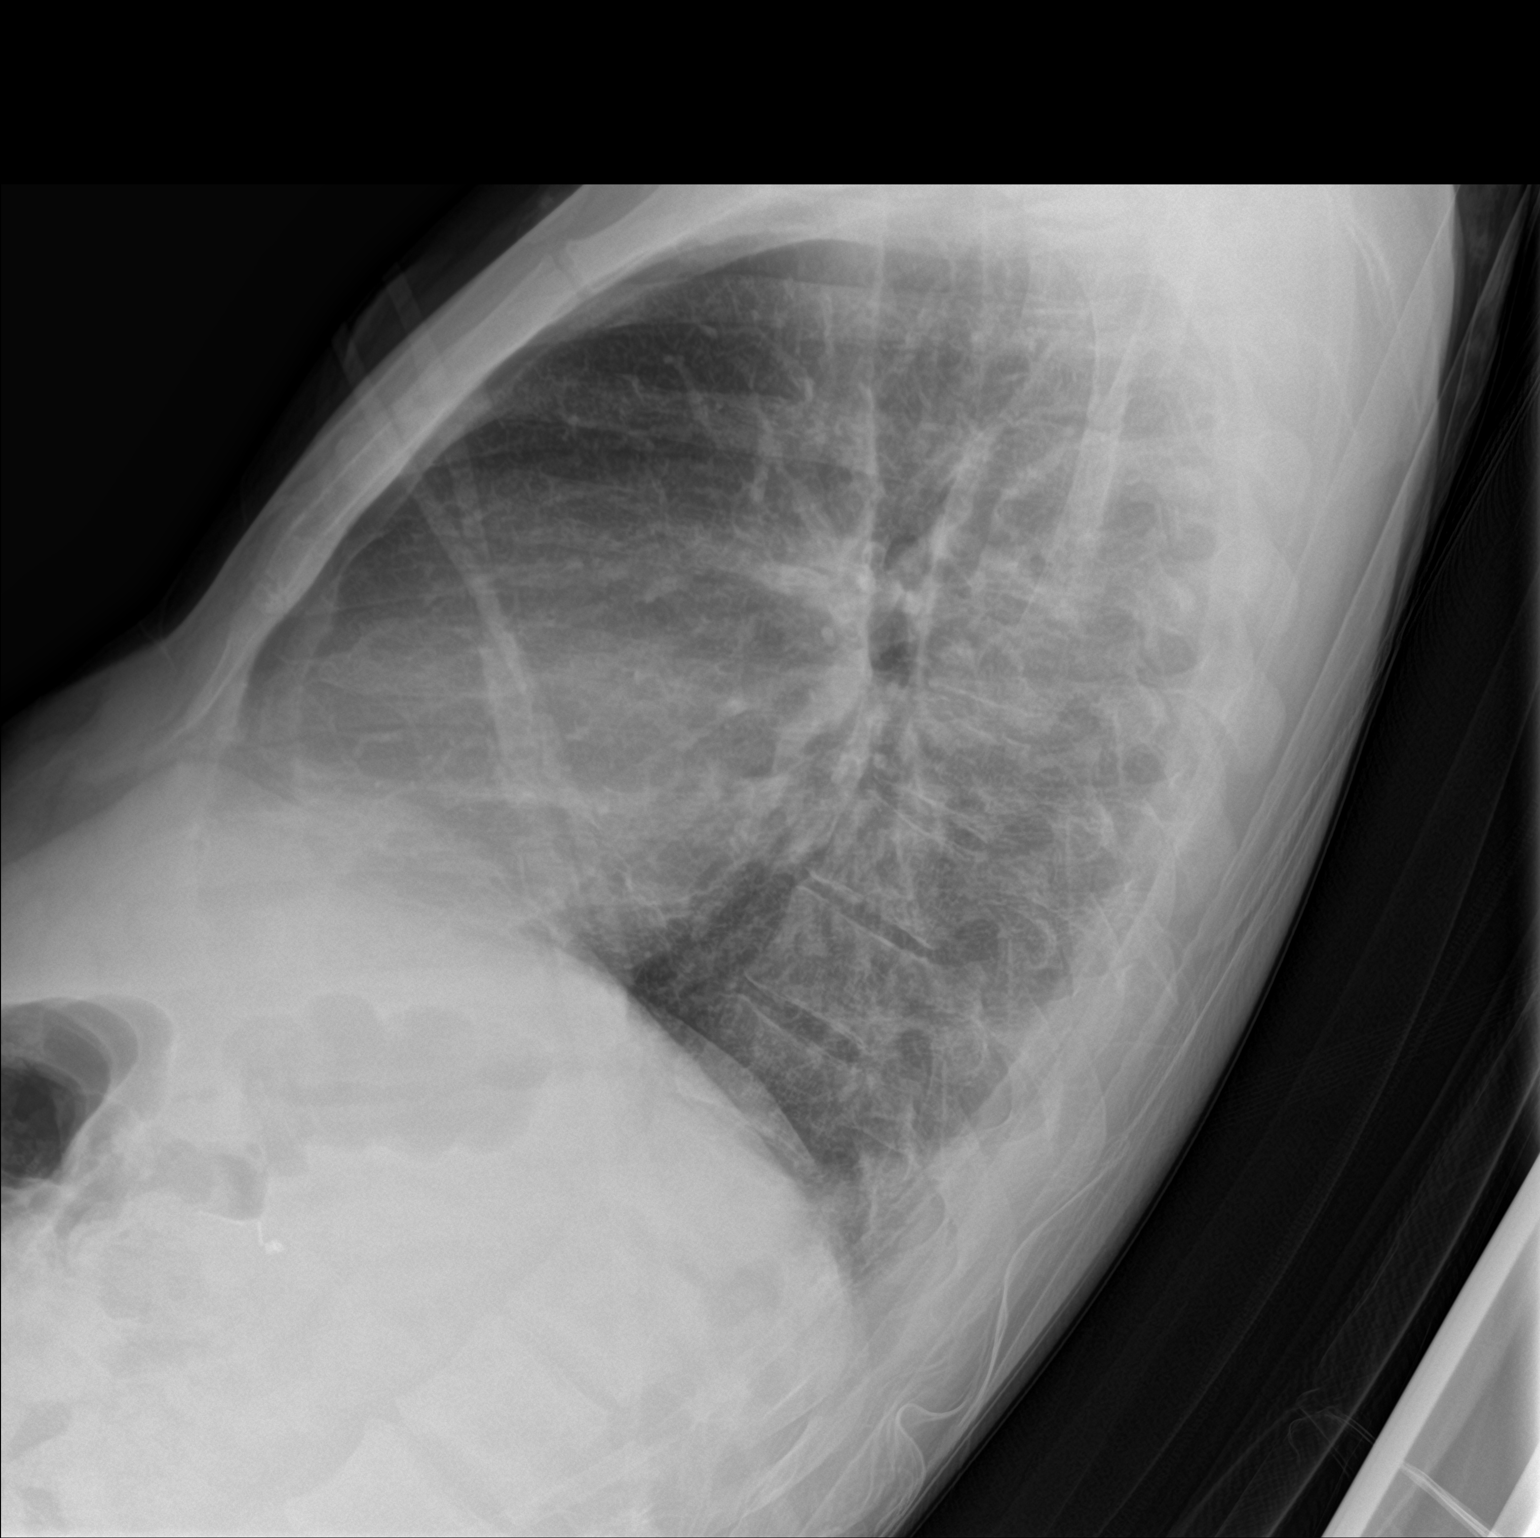

[chest ap]
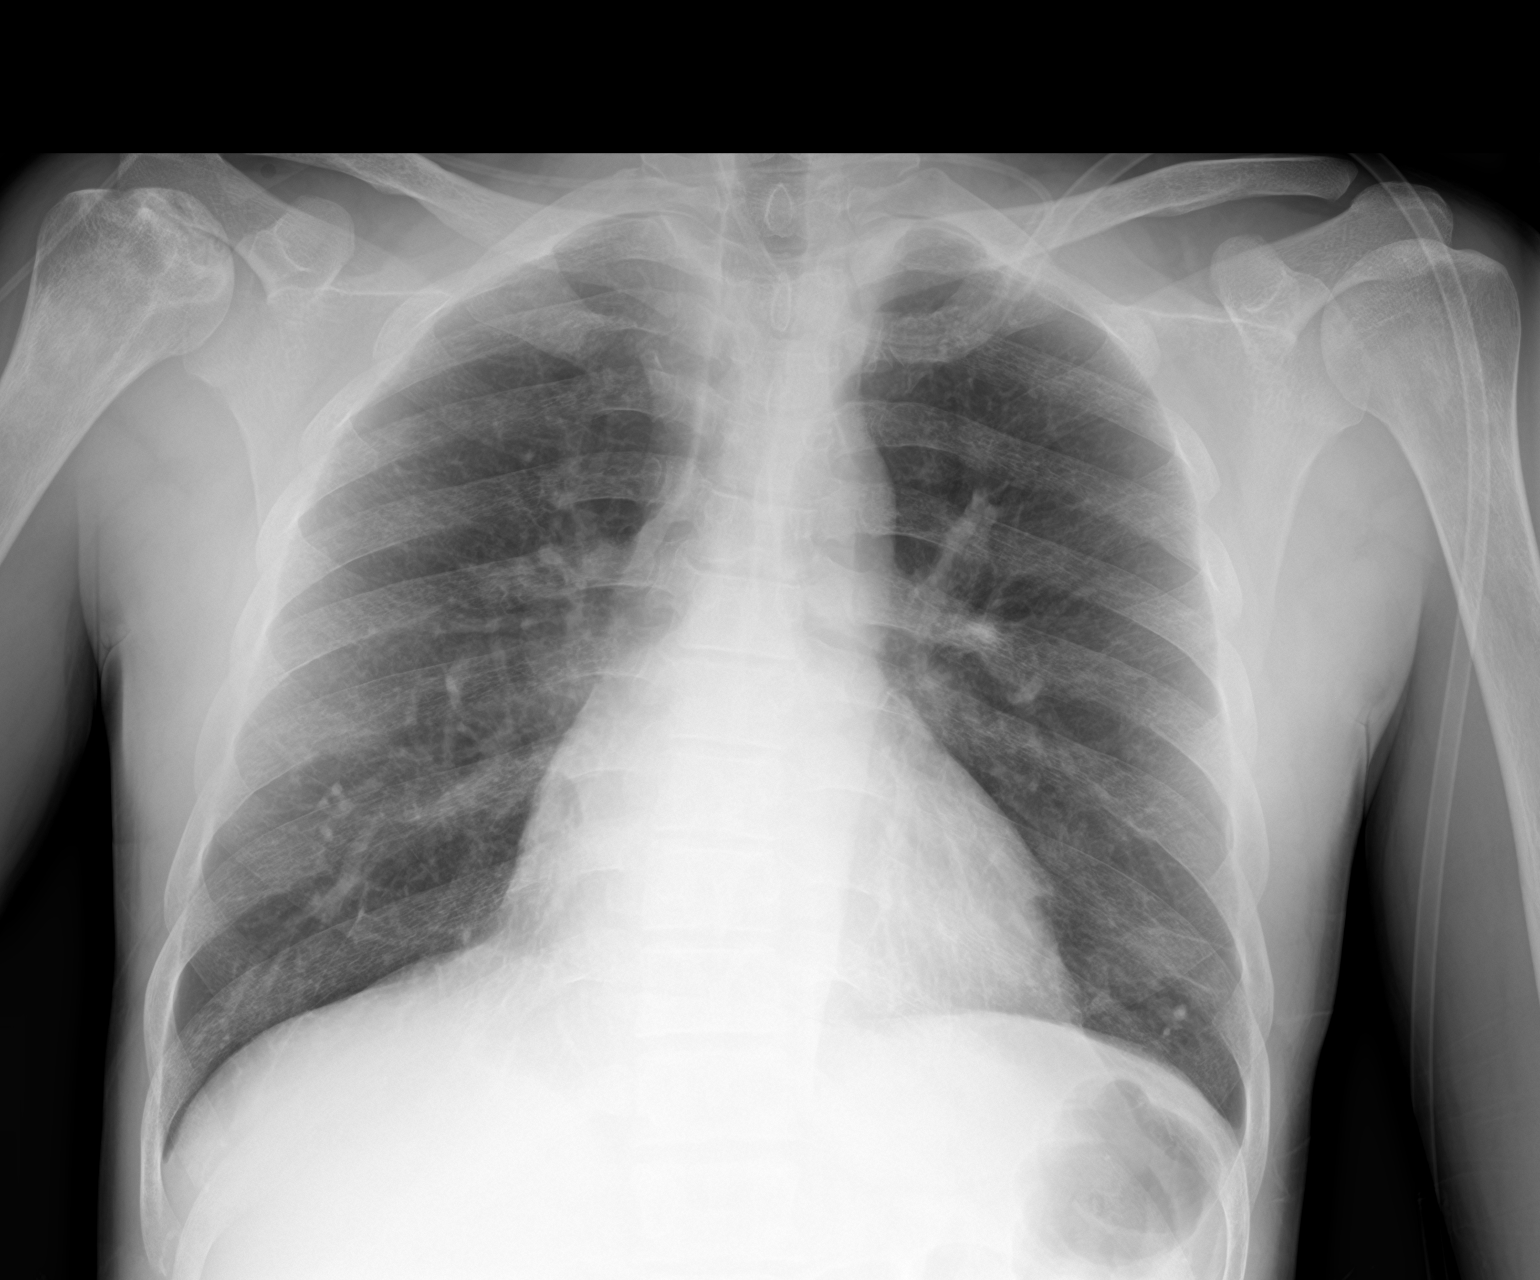

[2 of 2 positions shown; findings below may reference images not displayed]

FINDINGS: Chronic avascular necrosis of the right humeral head. Faint
interstitial accentuation, chronic. Cardiac and mediastinal margins
appear normal. No airspace opacity identified.
IMPRESSION: 1. No acute findings
2. Mild chronic interstitial accentuation.
3. Chronic avascular necrosis of the right humeral head.

## 2018-03-27 ENCOUNTER — Ambulatory Visit: Payer: Medicare Other | Admitting: Family Medicine

## 2018-03-28 ENCOUNTER — Telehealth: Payer: Self-pay

## 2018-03-28 ENCOUNTER — Ambulatory Visit (INDEPENDENT_AMBULATORY_CARE_PROVIDER_SITE_OTHER): Payer: Medicare Other | Admitting: Family Medicine

## 2018-03-28 ENCOUNTER — Encounter: Payer: Self-pay | Admitting: Family Medicine

## 2018-03-28 VITALS — BP 110/77 | HR 102 | Temp 97.9°F | Resp 14 | Ht 63.0 in | Wt 127.0 lb

## 2018-03-28 DIAGNOSIS — N183 Chronic kidney disease, stage 3 unspecified: Secondary | ICD-10-CM

## 2018-03-28 DIAGNOSIS — D571 Sickle-cell disease without crisis: Secondary | ICD-10-CM

## 2018-03-28 DIAGNOSIS — E559 Vitamin D deficiency, unspecified: Secondary | ICD-10-CM

## 2018-03-28 DIAGNOSIS — F1190 Opioid use, unspecified, uncomplicated: Secondary | ICD-10-CM

## 2018-03-28 LAB — POCT URINALYSIS DIPSTICK
Bilirubin, UA: NEGATIVE
Glucose, UA: NEGATIVE
Ketones, UA: NEGATIVE
Leukocytes, UA: NEGATIVE
Nitrite, UA: NEGATIVE
Protein, UA: POSITIVE — AB
Spec Grav, UA: 1.01 (ref 1.010–1.025)
Urobilinogen, UA: 0.2 E.U./dL
pH, UA: 7 (ref 5.0–8.0)

## 2018-03-28 MED ORDER — HYDROXYUREA 500 MG PO CAPS: ORAL_CAPSULE | ORAL | 4 refills | Status: DC

## 2018-03-28 MED ORDER — OXYCODONE HCL 5 MG PO TABS: 5.0000 mg | ORAL_TABLET | Freq: Four times a day (QID) | ORAL | 0 refills | Status: AC | PRN

## 2018-03-28 NOTE — Patient Instructions (Signed)
Sickle Cell Anemia, Adult °Sickle cell anemia is a condition where your red blood cells are shaped like sickles. Red blood cells carry oxygen through the body. Sickle-shaped cells do not live as long as normal red blood cells. They also clump together and block blood from flowing through the blood vessels. This prevents the body from getting enough oxygen. Sickle cell anemia causes organ damage and pain. It also increases the risk of infection. °Follow these instructions at home: °Medicines °· Take over-the-counter and prescription medicines only as told by your doctor. °· If you were prescribed an antibiotic medicine, take it as told by your doctor. Do not stop taking the antibiotic even if you start to feel better. °· If you develop a fever, do not take medicines to lower the fever right away. Tell your doctor about the fever. °Managing pain, stiffness, and swelling °· Try these methods to help with pain: °? Use a heating pad. °? Take a warm bath. °? Distract yourself, such as by watching TV. °Eating and drinking °· Drink enough fluid to keep your pee (urine) clear or pale yellow. Drink more in hot weather and during exercise. °· Limit or avoid alcohol. °· Eat a healthy diet. Eat plenty of fruits, vegetables, whole grains, and lean protein. °· Take vitamins and supplements as told by your doctor. °Traveling °· When traveling, keep these with you: °? Your medical information. °? The names of your doctors. °? Your medicines. °· If you need to take an airplane, talk to your doctor first. °Activity °· Rest often. °· Avoid exercises that make your heart beat much faster, such as jogging. °General instructions °· Do not use products that have nicotine or tobacco, such as cigarettes and e-cigarettes. If you need help quitting, ask your doctor. °· Consider wearing a medical alert bracelet. °· Avoid being in high places (high altitudes), such as mountains. °· Avoid very hot or cold temperatures. °· Avoid places where the  temperature changes a lot. °· Keep all follow-up visits as told by your doctor. This is important. °Contact a doctor if: °· A joint hurts. °· Your feet or hands hurt or swell. °· You feel tired (fatigued). °Get help right away if: °· You have symptoms of infection. These include: °? Fever. °? Chills. °? Being very tired. °? Irritability. °? Poor eating. °? Throwing up (vomiting). °· You feel dizzy or faint. °· You have new stomach pain, especially on the left side. °· You have a an erection (priapism) that lasts more than 4 hours. °· You have numbness in your arms or legs. °· You have a hard time moving your arms or legs. °· You have trouble talking. °· You have pain that does not go away when you take medicine. °· You are short of breath. °· You are breathing fast. °· You have a long-term cough. °· You have pain in your chest. °· You have a bad headache. °· You have a stiff neck. °· Your stomach looks bloated even though you did not eat much. °· Your skin is pale. °· You suddenly cannot see well. °Summary °· Sickle cell anemia is a condition where your red blood cells are shaped like sickles. °· Follow your doctor's advice on ways to manage pain, food to eat, activities to do, and steps to take for safe travel. °· Get medical help right away if you have any signs of infection, such as a fever. °This information is not intended to replace advice given to you by your   health care provider. Make sure you discuss any questions you have with your health care provider. °Document Released: 11/20/2012 Document Revised: 03/07/2016 Document Reviewed: 03/07/2016 °Elsevier Interactive Patient Education © 2019 Elsevier Inc. ° °

## 2018-03-28 NOTE — Progress Notes (Signed)
[facility] INTERNAL MEDICINE AND SICKLE CELL CARE  SICKLE CELL ANEMIA FOLLOW UP VISIT PROVIDER: Lanae Boast, FNP    Subjective:   [name] [name].  is a 38 y.o.  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 presents for a follow up for Sickle Cell Anemia. The patient has had 2 admissions in the past 6 months.  Pain regimen includes: Ibuprofen and oxycodone 5mg  Hydrea Therapy: Yes Medication compliance: Yes  Pain today is 5/10 The patient reports adequate daily hydration.     Review of Systems  Constitutional: Negative.   HENT: Negative.   Eyes: Negative.   Respiratory: Negative.   Cardiovascular: Negative.   Gastrointestinal: Negative.   Genitourinary: Negative.   Musculoskeletal: Positive for myalgias (baseline).  Skin: Negative.   Neurological: Negative.   Psychiatric/Behavioral: Negative.     Objective:   Objective  BP 110/77 (BP Location: Left Arm, Patient Position: Sitting, Cuff Size: Normal)   Pulse (!) 102   Temp 97.9 F (36.6 C) (Oral)   Resp 14   Ht 5\' 3"  (1.6 m)   Wt 127 lb (57.6 kg)   SpO2 100%   BMI 22.50 kg/m   Wt Readings from Last 3 Encounters:  03/28/18 127 lb (57.6 kg)  03/06/18 130 lb (59 kg)  02/04/18 129 lb (58.5 kg)     Physical Exam Vitals signs and nursing note reviewed.  Constitutional:      General: He is not in acute distress.    Appearance: Normal appearance. He is well-developed.  HENT:     Head: Normocephalic and  atraumatic.  Eyes:     Conjunctiva/sclera: Conjunctivae normal.     Pupils: Pupils are equal, round, and reactive to light.  Neck:     Musculoskeletal: Normal range of motion.  Pulmonary:     Effort: No respiratory distress.  Abdominal:     General: There is no distension.  Musculoskeletal: Normal range of motion.  Skin:    General: Skin is warm and dry.  Neurological:     Mental Status: He is alert and oriented to person, place, and time.  Psychiatric:        Behavior: Behavior normal.        Thought Content: Thought content normal.        Judgment: Judgment normal.      Assessment/Plan:   Assessment   Encounter Diagnoses  Name Primary?  . Sickle cell disease, type SS.  Yes  . Stage 3 chronic kidney disease ([place])   . Chronic, continuous use of opioids   . Vitamin D deficiency      Plan  1. Sickle cell disease, type SS.  Medications refilled today. Patient to start Oxycodone 5mg  Q6h.  - Urinalysis Dipstick - hydroxyurea (HYDREA) 500 MG capsule; TAKE 3 CAPSULES (1,500 MG TOTAL) BY MOUTH DAILY.  Dispense: 90 capsule; Refill: 4 - oxyCODONE (OXY IR/ROXICODONE) 5 MG immediate release tablet; Take 1 tablet (5 mg total) by mouth every 6 (six) hours as needed for up to 15 days for severe pain.  Dispense: 60 tablet; Refill: 0 - Drug Screen 8 w/Conf, Ur - CBC with  Differential - Hemoglobinopathy Evaluation - Ferritin - VITAMIN D 25 Hydroxy (Vit-D Deficiency, Fractures) - Reticulocytes  2. Stage 3 chronic kidney disease ([place]) Will continue to monitor.   3. Chronic, continuous use of opioids Drug screening today - oxyCODONE (OXY IR/ROXICODONE) 5 MG immediate release tablet; Take 1 tablet (5 mg total) by mouth every 6 (six) hours as needed for up to 15 days for severe pain.  Dispense: 60 tablet; Refill: 0  4. Vitamin D deficiency Pending labs. Will adjust medications accordingly.    - VITAMIN D 25 Hydroxy (Vit-D Deficiency, Fractures)    Return to care as scheduled and  prn. Patient verbalized understanding and agreed with plan of care.   1. Sickle cell disease - Continue Hydrea   We discussed the need for good hydration, monitoring of hydration status, avoidance of heat, cold, stress, and infection triggers. We discussed the risks and benefits of Hydrea, including bone marrow suppression, the possibility of GI upset, skin ulcers, hair thinning, and teratogenicity. The patient was reminded of the need to seek medical attention of any symptoms of bleeding, anemia, or infection. Continue folic acid 1 mg daily to prevent aplastic bone marrow crises.   2. Pulmonary evaluation - Patient denies severe recurrent wheezes, shortness of breath with exercise, or persistent cough. If these symptoms develop, pulmonary function tests with spirometry will be ordered, and if abnormal, plan on referral to Pulmonology for further evaluation.  3. Cardiac - Routine screening for pulmonary hypertension is not recommended.  4. Eye - High risk of proliferative retinopathy. Annual eye exam with retinal exam recommended to patient.  5. Immunization status -  Yearly influenza vaccination is recommended, as well as being up to date with Meningococcal and Pneumococcal vaccines.   6. Acute and chronic painful episodes - We discussed that pt is to receive Schedule II prescriptions only from [place]. Pt is also aware that the prescription history is available to [place] online through the [facility] CSRS. Controlled substance agreement signed. We reminded [name] [name]. that all patients receiving Schedule II narcotics must be seen for follow within one month of prescription being requested. We reviewed the terms of our pain agreement, including the need to keep medicines in a safe locked location away from children or pets, and the need to report excess sedation or constipation, measures to avoid constipation, and policies related to early refills and stolen prescriptions. According to the [place] [facility]  Initiative program, we have reviewed details related to analgesia, adverse effects, aberrant behaviors.  7. Iron overload from chronic transfusion.  Not applicable at this time.  If this occurs will use Exjade for management.   8. Vitamin D deficiency - Drisdol 50,000 units weekly. Patient encouraged to take as prescribed.   The above recommendations are taken from the NIH Evidence-Based Management of Sickle Cell Disease: Expert Panel Report, 20149.   [name], FNP-BC Patient [facility] [address] [place], [place] [address] [phone]  This note has been created with Dragon speech recognition software and smart phrase technology. Any transcriptional errors are unintentional.

## 2018-03-28 NOTE — Telephone Encounter (Signed)
Patient called and was asking why he was only given 5mg  oxycodone instead of 10mg . I spoke with Lanae Boast regarding this and she advised that he has not been on anything for pain so she is starting on a low dose and that should offer some relief. I called and spoke with patient and informed that we are starting on the 5mg s and we need to see how he does on this since he hasn't been on any oxycodone. He said the 5's do not work for me, he was frustrated. I advised that this is the plan we are following and to call if it is not working after a couple of days and we can see and re-evaluate him if needed. He hung up the phone. Thanks!

## 2018-03-29 LAB — DRUG SCREEN 8 W/CONF, UR
Amphetamines, Urine: NEGATIVE ng/mL
BENZODIAZ UR QL: NEGATIVE ng/mL
Barbiturate screen, urine: NEGATIVE ng/mL
Buprenorphine, Urine: NEGATIVE ng/mL
Cannabinoid Quant, Ur: NEGATIVE ng/mL
Cocaine (Metab.): NEGATIVE ng/mL
Methadone Screen, Urine: NEGATIVE ng/mL
OPIATE SCREEN URINE: NEGATIVE ng/mL

## 2018-04-02 LAB — HEMOGLOBINOPATHY EVALUATION
Ferritin: 4367 ng/mL — ABNORMAL HIGH (ref 30–400)
Hematocrit: 22.8 % — ABNORMAL LOW (ref 37.5–51.0)
Hemoglobin: 7.8 g/dL — ABNORMAL LOW (ref 13.0–17.7)
Hgb A2 Quant: 3.7 % — ABNORMAL HIGH (ref 1.8–3.2)
Hgb A: 19.2 % — ABNORMAL LOW (ref 96.4–98.8)
Hgb C: 0 %
Hgb F Quant: 14.9 % — ABNORMAL HIGH (ref 0.0–2.0)
Hgb S: 62.2 % — ABNORMAL HIGH
Hgb Solubility: POSITIVE — AB
Hgb Variant: 0 %
MCH: 39.4 pg — ABNORMAL HIGH (ref 26.6–33.0)
MCHC: 34.2 g/dL (ref 31.5–35.7)
MCV: 115 fL — ABNORMAL HIGH (ref 79–97)
NRBC: 1 % — ABNORMAL HIGH (ref 0–0)
Platelets: 267 10*3/uL (ref 150–450)
RBC: 1.98 x10E6/uL — CL (ref 4.14–5.80)
WBC: 5.9 10*3/uL (ref 3.4–10.8)

## 2018-04-02 LAB — CBC WITH DIFFERENTIAL/PLATELET
Basophils Absolute: 0 10*3/uL (ref 0.0–0.2)
Basos: 1 %
EOS (ABSOLUTE): 0.1 10*3/uL (ref 0.0–0.4)
Eos: 1 %
Immature Grans (Abs): 0 10*3/uL (ref 0.0–0.1)
Immature Granulocytes: 0 %
Lymphocytes Absolute: 3.3 10*3/uL — ABNORMAL HIGH (ref 0.7–3.1)
Lymphs: 57 %
Monocytes Absolute: 0.2 10*3/uL (ref 0.1–0.9)
Monocytes: 4 %
Neutrophils Absolute: 2.2 10*3/uL (ref 1.4–7.0)
Neutrophils: 37 %

## 2018-04-02 LAB — VITAMIN D 25 HYDROXY (VIT D DEFICIENCY, FRACTURES): Vit D, 25-Hydroxy: 7.7 ng/mL — ABNORMAL LOW (ref 30.0–100.0)

## 2018-04-02 LAB — RETICULOCYTES: Retic Ct Pct: 4 % — ABNORMAL HIGH (ref 0.6–2.6)

## 2018-04-05 ENCOUNTER — Telehealth: Payer: Self-pay

## 2018-04-05 ENCOUNTER — Other Ambulatory Visit: Payer: Self-pay

## 2018-04-05 DIAGNOSIS — E559 Vitamin D deficiency, unspecified: Secondary | ICD-10-CM

## 2018-04-05 MED ORDER — ERGOCALCIFEROL 1.25 MG (50000 UT) PO CAPS: 50000.0000 [IU] | ORAL_CAPSULE | ORAL | 1 refills | Status: DC

## 2018-04-05 NOTE — Telephone Encounter (Signed)
Denied. Patient is not due for a refill until 04/12/2018.

## 2018-04-05 NOTE — Telephone Encounter (Signed)
Called patient to advised that labs are stable except for vitamin D. Advised to take vitamin D weekly as prescribed and no medication changes at this time.  Patient states understanding. He wanted to let you know he is still in pain and is asking if anything can be changed on his pain medication. He says the 5mg  are not strong enough. Please advise.

## 2018-04-05 NOTE — Telephone Encounter (Signed)
-----   Message from Lanae Boast, [place] sent at 04/04/2018  4:43 PM EST ----- Labs are stable. Vitamin D is low. Will need to take vitamin D weekly as previously prescribed. No medication changes warranted.

## 2018-04-08 NOTE — Telephone Encounter (Signed)
Called, no answer. Left a message that medication can not be adjusted at this time and refill is not due until 04/12/2018. Asked if any questions to call back to our office. Thanks !

## 2018-04-25 ENCOUNTER — Encounter: Payer: Self-pay | Admitting: Family Medicine

## 2018-04-25 ENCOUNTER — Other Ambulatory Visit: Payer: Self-pay

## 2018-04-25 ENCOUNTER — Ambulatory Visit (INDEPENDENT_AMBULATORY_CARE_PROVIDER_SITE_OTHER): Payer: Medicare Other | Admitting: Family Medicine

## 2018-04-25 VITALS — BP 120/87 | HR 85 | Temp 97.8°F | Resp 16 | Wt 126.0 lb

## 2018-04-25 DIAGNOSIS — D571 Sickle-cell disease without crisis: Secondary | ICD-10-CM

## 2018-04-25 DIAGNOSIS — G894 Chronic pain syndrome: Secondary | ICD-10-CM | POA: Diagnosis not present

## 2018-04-25 DIAGNOSIS — N183 Chronic kidney disease, stage 3 unspecified: Secondary | ICD-10-CM

## 2018-04-25 DIAGNOSIS — N08 Glomerular disorders in diseases classified elsewhere: Secondary | ICD-10-CM | POA: Diagnosis not present

## 2018-04-25 LAB — POCT URINALYSIS DIP (MANUAL ENTRY)
Bilirubin, UA: NEGATIVE
Glucose, UA: 100 mg/dL — AB
Ketones, POC UA: NEGATIVE mg/dL
Leukocytes, UA: NEGATIVE
Nitrite, UA: NEGATIVE
Protein Ur, POC: >=300 mg/dL — AB
Spec Grav, UA: 1.02 (ref 1.010–1.025)
Urobilinogen, UA: 1 E.U./dL
pH, UA: 7 (ref 5.0–8.0)

## 2018-04-25 MED ORDER — OXYCODONE HCL ER 15 MG PO T12A: 15.0000 mg | EXTENDED_RELEASE_TABLET | Freq: Two times a day (BID) | ORAL | 0 refills | Status: DC

## 2018-04-25 MED ORDER — OXYCODONE HCL 10 MG PO TABS: 10.0000 mg | ORAL_TABLET | Freq: Four times a day (QID) | ORAL | 0 refills | Status: AC | PRN

## 2018-04-25 NOTE — Progress Notes (Signed)
[facility] INTERNAL MEDICINE AND SICKLE CELL CARE  SICKLE CELL ANEMIA FOLLOW UP VISIT PROVIDER: Lanae Boast, FNP    Subjective:   [name] [name].  is a 38 y.o.  male who  has a past medical history of Arachnoid Cyst of brain bilaterally, Bacterial pneumonia (~ 2012), Chronic kidney disease, CKD (chronic kidney disease), stage II, GERD (gastroesophageal reflux disease), Gynecomastia, male (07/10/2012), History of blood transfusion, Immune-complex glomerulonephritis (06/1992), Migraines, Sickle cell anemia ([place]), Sickle cell crisis ([place]) (09/25/2012), Sickle cell nephropathy ([place]) (07/10/2012), Sinus tachycardia, and Tachycardia with heart rate 121-140 beats per minute with ambulation (08/04/2016). presents for a follow up for Sickle Cell Anemia. The patient has had 2 admissions in the past 6 months.  Pain regimen includes: Ibuprofen and oxycodone 5mg  Hydrea Therapy: Yes Medication compliance: Yes  Pain today is 5/10 The patient reports adequate daily hydration.     Review of Systems  Constitutional: Negative.   HENT: Negative.   Eyes: Negative.   Respiratory: Negative.   Cardiovascular: Negative.   Gastrointestinal: Negative.   Genitourinary: Negative.   Musculoskeletal: Positive for myalgias (baseline).  Skin: Negative.   Neurological: Negative.   Psychiatric/Behavioral: Negative.     Objective:   Objective  BP 120/87 (BP Location: Right Arm, Patient Position: Sitting)   Pulse 85   Temp 97.8 F (36.6 C) (Oral)   Resp 16   Wt 126 lb (57.2 kg)   BMI 22.32 kg/m   Wt Readings from Last 3 Encounters:  04/25/18 126 lb (57.2 kg)  03/28/18 127 lb (57.6 kg)  03/06/18 130 lb (59 kg)     Physical Exam Vitals signs and nursing note reviewed.  Constitutional:      General: He is not in acute distress.    Appearance: Normal appearance. He is well-developed.  HENT:     Head: Normocephalic and atraumatic.  Eyes:     Conjunctiva/sclera: Conjunctivae normal.      Pupils: Pupils are equal, round, and reactive to light.  Neck:     Musculoskeletal: Normal range of motion.  Pulmonary:     Effort: No respiratory distress.  Abdominal:     General: There is no distension.  Musculoskeletal: Normal range of motion.  Skin:    General: Skin is warm and dry.  Neurological:     Mental Status: He is alert and oriented to person, place, and time.  Psychiatric:        Behavior: Behavior normal.        Thought Content: Thought content normal.        Judgment: Judgment normal.      Assessment/Plan:   Assessment   Encounter Diagnoses  Name Primary?  . Follow up Yes  . Sickle cell nephropathy without crisis ([place])   . Chronic pain syndrome      Plan  1. Sickle cell nephropathy without crisis ([place]) 2. Chronic pain syndrome Increased pain medication to Oxycodone 10mg  and Oxycontin 15mg  Q12 h.  - POCT urinalysis dipstick - oxyCODONE (OXYCONTIN) 15 mg 12 hr tablet; Take 1 tablet (15 mg total) by mouth every 12 (twelve) hours for 30 days.  Dispense: 60 tablet; Refill: 0 - Oxycodone HCl 10 MG TABS; Take 1 tablet (10 mg total) by mouth every 6 (six) hours as needed for up to 15 days.  Dispense: 60 tablet; Refill: 0  3. Stage 3 chronic kidney disease ([place]) Non compliant with nephrology follow up appts.  Return to care as scheduled and prn. Patient verbalized understanding and  agreed with plan of care.   1. Sickle cell disease - Continue Hydrea   We discussed the need for good hydration, monitoring of hydration status, avoidance of heat, cold, stress, and infection triggers. We discussed the risks and benefits of Hydrea, including bone marrow suppression, the possibility of GI upset, skin ulcers, hair thinning, and teratogenicity. The patient was reminded of the need to seek medical attention of any symptoms of bleeding, anemia, or infection. Continue folic acid 1 mg daily to prevent aplastic bone marrow crises.   2. Pulmonary evaluation - Patient denies  severe recurrent wheezes, shortness of breath with exercise, or persistent cough. If these symptoms develop, pulmonary function tests with spirometry will be ordered, and if abnormal, plan on referral to Pulmonology for further evaluation.  3. Cardiac - Routine screening for pulmonary hypertension is not recommended.  4. Eye - High risk of proliferative retinopathy. Annual eye exam with retinal exam recommended to patient.  5. Immunization status -  Yearly influenza vaccination is recommended, as well as being up to date with Meningococcal and Pneumococcal vaccines.   6. Acute and chronic painful episodes - We discussed that pt is to receive Schedule II prescriptions only from [place]. Pt is also aware that the prescription history is available to [place] online through the [facility] CSRS. Controlled substance agreement signed. We reminded [name] [name]. that all patients receiving Schedule II narcotics must be seen for follow within one month of prescription being requested. We reviewed the terms of our pain agreement, including the need to keep medicines in a safe locked location away from children or pets, and the need to report excess sedation or constipation, measures to avoid constipation, and policies related to early refills and stolen prescriptions. According to the [place] [facility] [employer] program, we have reviewed details related to analgesia, adverse effects, aberrant behaviors.  7. Iron overload from chronic transfusion.  Not applicable at this time.  If this occurs will use Exjade for management.   8. Vitamin D deficiency - Drisdol 50,000 units weekly. Patient encouraged to take as prescribed.   The above recommendations are taken from the NIH Evidence-Based Management of Sickle Cell Disease: Expert Panel Report, 20149.   [name], FNP-BC [facility] [address] [place], [place] [address] [phone]  This note has been created with  Dragon speech recognition software and smart phrase technology. Any transcriptional errors are unintentional.

## 2018-04-27 ENCOUNTER — Other Ambulatory Visit: Payer: Self-pay | Admitting: Family Medicine

## 2018-05-05 ENCOUNTER — Other Ambulatory Visit: Payer: Self-pay

## 2018-05-05 ENCOUNTER — Inpatient Hospital Stay (HOSPITAL_COMMUNITY)
Admission: EM | Admit: 2018-05-05 | Discharge: 2018-05-08 | DRG: 812 | Disposition: A | Discharge status: Home / Self Care | Payer: Medicare Other | Attending: Internal Medicine | Admitting: Internal Medicine

## 2018-05-05 ENCOUNTER — Encounter (HOSPITAL_COMMUNITY): Payer: Self-pay | Admitting: Emergency Medicine

## 2018-05-05 ENCOUNTER — Emergency Department (HOSPITAL_COMMUNITY): Payer: Medicare Other

## 2018-05-05 DIAGNOSIS — F1410 Cocaine abuse, uncomplicated: Secondary | ICD-10-CM | POA: Diagnosis present

## 2018-05-05 DIAGNOSIS — N08 Glomerular disorders in diseases classified elsewhere: Secondary | ICD-10-CM | POA: Diagnosis present

## 2018-05-05 DIAGNOSIS — G894 Chronic pain syndrome: Secondary | ICD-10-CM | POA: Diagnosis present

## 2018-05-05 DIAGNOSIS — Z8701 Personal history of pneumonia (recurrent): Secondary | ICD-10-CM | POA: Diagnosis not present

## 2018-05-05 DIAGNOSIS — K219 Gastro-esophageal reflux disease without esophagitis: Secondary | ICD-10-CM | POA: Diagnosis present

## 2018-05-05 DIAGNOSIS — D57219 Sickle-cell/Hb-C disease with crisis, unspecified: Secondary | ICD-10-CM | POA: Diagnosis not present

## 2018-05-05 DIAGNOSIS — Z79899 Other long term (current) drug therapy: Secondary | ICD-10-CM | POA: Diagnosis not present

## 2018-05-05 DIAGNOSIS — R079 Chest pain, unspecified: Secondary | ICD-10-CM | POA: Diagnosis not present

## 2018-05-05 DIAGNOSIS — N183 Chronic kidney disease, stage 3 unspecified: Secondary | ICD-10-CM | POA: Diagnosis present

## 2018-05-05 DIAGNOSIS — F17210 Nicotine dependence, cigarettes, uncomplicated: Secondary | ICD-10-CM | POA: Diagnosis present

## 2018-05-05 DIAGNOSIS — D72829 Elevated white blood cell count, unspecified: Secondary | ICD-10-CM

## 2018-05-05 DIAGNOSIS — Z9049 Acquired absence of other specified parts of digestive tract: Secondary | ICD-10-CM | POA: Diagnosis not present

## 2018-05-05 DIAGNOSIS — F1120 Opioid dependence, uncomplicated: Secondary | ICD-10-CM | POA: Diagnosis present

## 2018-05-05 DIAGNOSIS — D638 Anemia in other chronic diseases classified elsewhere: Secondary | ICD-10-CM | POA: Diagnosis present

## 2018-05-05 DIAGNOSIS — Z791 Long term (current) use of non-steroidal anti-inflammatories (NSAID): Secondary | ICD-10-CM | POA: Diagnosis not present

## 2018-05-05 DIAGNOSIS — D5700 Hb-SS disease with crisis, unspecified: Principal | ICD-10-CM | POA: Diagnosis present

## 2018-05-05 DIAGNOSIS — Z803 Family history of malignant neoplasm of breast: Secondary | ICD-10-CM | POA: Diagnosis not present

## 2018-05-05 DIAGNOSIS — D649 Anemia, unspecified: Secondary | ICD-10-CM | POA: Diagnosis present

## 2018-05-05 LAB — CBC WITH DIFFERENTIAL/PLATELET
Abs Immature Granulocytes: 0.03 10*3/uL (ref 0.00–0.07)
Basophils Absolute: 0 10*3/uL (ref 0.0–0.1)
Basophils Relative: 0 %
Eosinophils Absolute: 0.1 10*3/uL (ref 0.0–0.5)
Eosinophils Relative: 1 %
HCT: 17.3 % — ABNORMAL LOW (ref 39.0–52.0)
Hemoglobin: 6.1 g/dL — CL (ref 13.0–17.0)
Immature Granulocytes: 0 %
Lymphocytes Relative: 36 %
Lymphs Abs: 2.9 10*3/uL (ref 0.7–4.0)
MCH: 44.2 pg — ABNORMAL HIGH (ref 26.0–34.0)
MCHC: 35.3 g/dL (ref 30.0–36.0)
MCV: 125.4 fL — ABNORMAL HIGH (ref 80.0–100.0)
MONOS PCT: 3 %
Monocytes Absolute: 0.3 10*3/uL (ref 0.1–1.0)
NEUTROS ABS: 4.9 10*3/uL (ref 1.7–7.7)
Neutrophils Relative %: 60 %
Platelets: 234 10*3/uL (ref 150–400)
RBC: 1.38 MIL/uL — ABNORMAL LOW (ref 4.22–5.81)
WBC: 8.2 10*3/uL (ref 4.0–10.5)
nRBC: 1.1 % — ABNORMAL HIGH (ref 0.0–0.2)

## 2018-05-05 LAB — COMPREHENSIVE METABOLIC PANEL
ALT: 42 U/L (ref 0–44)
AST: 61 U/L — ABNORMAL HIGH (ref 15–41)
Albumin: 2.2 g/dL — ABNORMAL LOW (ref 3.5–5.0)
Alkaline Phosphatase: 303 U/L — ABNORMAL HIGH (ref 38–126)
Anion gap: 8 (ref 5–15)
BUN: 29 mg/dL — ABNORMAL HIGH (ref 6–20)
CO2: 20 mmol/L — ABNORMAL LOW (ref 22–32)
Calcium: 7.4 mg/dL — ABNORMAL LOW (ref 8.9–10.3)
Chloride: 111 mmol/L (ref 98–111)
Creatinine, Ser: 2.81 mg/dL — ABNORMAL HIGH (ref 0.61–1.24)
GFR calc non Af Amer: 28 mL/min — ABNORMAL LOW (ref >60)
GFR, EST AFRICAN AMERICAN: 32 mL/min — AB (ref >60)
Glucose, Bld: 157 mg/dL — ABNORMAL HIGH (ref 70–99)
Potassium: 4 mmol/L (ref 3.5–5.1)
Sodium: 139 mmol/L (ref 135–145)
Total Bilirubin: 1.1 mg/dL (ref 0.3–1.2)
Total Protein: 5.6 g/dL — ABNORMAL LOW (ref 6.5–8.1)

## 2018-05-05 LAB — RETICULOCYTES
Immature Retic Fract: 33.6 % — ABNORMAL HIGH (ref 2.3–15.9)
RBC.: 1.38 MIL/uL — ABNORMAL LOW (ref 4.22–5.81)
RETIC CT PCT: 5 % — AB (ref 0.4–3.1)
Retic Count, Absolute: 69.6 10*3/uL (ref 19.0–186.0)

## 2018-05-05 MED ORDER — NALOXONE HCL 0.4 MG/ML IJ SOLN: 0.4000 mg | INTRAMUSCULAR | Status: DC | PRN

## 2018-05-05 MED ORDER — SENNOSIDES-DOCUSATE SODIUM 8.6-50 MG PO TABS
1.0000 | ORAL_TABLET | Freq: Two times a day (BID) | ORAL | Status: DC
  Filled 2018-05-05 (×5): qty 1

## 2018-05-05 MED ORDER — HYDROMORPHONE HCL 2 MG/ML IJ SOLN: 2.0000 mg | INTRAMUSCULAR | Status: AC

## 2018-05-05 MED ORDER — NORTRIPTYLINE HCL 25 MG PO CAPS
25.0000 mg | ORAL_CAPSULE | Freq: Every day | ORAL | Status: DC
  Administered 2018-05-05 – 2018-05-07 (×3): 25 mg via ORAL
  Filled 2018-05-05 (×4): qty 1

## 2018-05-05 MED ORDER — HYDROMORPHONE 1 MG/ML IV SOLN
INTRAVENOUS | Status: DC
  Administered 2018-05-05: 30 mg via INTRAVENOUS
  Administered 2018-05-06: 1.77 mg via INTRAVENOUS
  Administered 2018-05-06: 2.79 mg via INTRAVENOUS
  Administered 2018-05-06: 2.95 mg via INTRAVENOUS
  Administered 2018-05-07: 1.18 mg via INTRAVENOUS
  Filled 2018-05-05: qty 30

## 2018-05-05 MED ORDER — POLYETHYLENE GLYCOL 3350 17 G PO PACK: 17.0000 g | PACK | Freq: Every day | ORAL | Status: DC | PRN

## 2018-05-05 MED ORDER — HEPARIN SODIUM (PORCINE) 5000 UNIT/ML IJ SOLN
5000.0000 [IU] | Freq: Three times a day (TID) | INTRAMUSCULAR | Status: DC
  Administered 2018-05-06 – 2018-05-07 (×4): 5000 [IU] via SUBCUTANEOUS
  Filled 2018-05-05 (×5): qty 1

## 2018-05-05 MED ORDER — HYDROMORPHONE HCL 2 MG/ML IJ SOLN
2.0000 mg | INTRAMUSCULAR | Status: AC
  Administered 2018-05-05: 2 mg via INTRAVENOUS
  Filled 2018-05-05: qty 1

## 2018-05-05 MED ORDER — DEXTROSE-NACL 5-0.45 % IV SOLN
INTRAVENOUS | Status: AC
  Administered 2018-05-06 (×2): via INTRAVENOUS

## 2018-05-05 MED ORDER — OXYCODONE HCL ER 15 MG PO T12A
15.0000 mg | EXTENDED_RELEASE_TABLET | Freq: Two times a day (BID) | ORAL | Status: DC
  Administered 2018-05-05 – 2018-05-08 (×6): 15 mg via ORAL
  Filled 2018-05-05 (×6): qty 1

## 2018-05-05 MED ORDER — SODIUM CHLORIDE 0.9% FLUSH: 9.0000 mL | INTRAVENOUS | Status: DC | PRN

## 2018-05-05 MED ORDER — KETOROLAC TROMETHAMINE 15 MG/ML IJ SOLN
15.0000 mg | INTRAMUSCULAR | Status: AC
  Administered 2018-05-05: 15 mg via INTRAVENOUS
  Filled 2018-05-05: qty 1

## 2018-05-05 MED ORDER — FOLIC ACID 1 MG PO TABS
1.0000 mg | ORAL_TABLET | Freq: Every day | ORAL | Status: DC
  Administered 2018-05-05 – 2018-05-08 (×4): 1 mg via ORAL
  Filled 2018-05-05 (×4): qty 1

## 2018-05-05 MED ORDER — DIPHENHYDRAMINE HCL 25 MG PO CAPS: 25.0000 mg | ORAL_CAPSULE | ORAL | Status: DC | PRN

## 2018-05-05 MED ORDER — DEXTROSE-NACL 5-0.45 % IV SOLN
INTRAVENOUS | Status: DC
  Administered 2018-05-05: 19:00:00 via INTRAVENOUS
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
## 2018-05-05 NOTE — ED Triage Notes (Signed)
Patient c/o pain in bilateral arms and legs r/t [facility] x2 days.

## 2018-05-05 NOTE — ED Notes (Signed)
ED TO INPATIENT HANDOFF REPORT  ED Nurse Name and Phone #: Earnest Bailey RN [phone]  S Name/Age/Gender [name] [name]. 38 y.o. male Room/Bed: WA13/WA13  Code Status   Code Status: Full Code  Home/SNF/Other Home Patient oriented to: self Is this baseline? Yes   Triage Complete: Triage complete  Chief Complaint Sickle Cell Pain Crisis   Triage Note Patient c/o pain in bilateral arms and legs r/t SCC x2 days.   Allergies Allergies  Allergen Reactions  . Nsaids Other (See Comments)    Kidney disease  . Morphine And Related Other (See Comments)    "real bad headaches"    Level of Care/Admitting Diagnosis ED Disposition    ED Disposition Condition [place]: [facility] [[id number]]  Level of Care: Med-Surg [16]  Diagnosis: Sickle cell pain crisis [facility]) [[id number]]  Admitting Physician: [name] [phone]  Attending Physician: [name] [phone]  Estimated length of stay: past midnight tomorrow  Certification:: I certify this patient will need inpatient services for at least 2 midnights  PT Class (Do Not Modify): Inpatient [101]  PT Acc Code (Do Not Modify): Private [1]       B Medical/Surgery History Past Medical History:  Diagnosis Date  . Arachnoid Cyst of brain bilaterally    "2 really small ones in the back of my head; inside; saw them w/MRI" (09/25/2012)  . Bacterial pneumonia ~ 2012   "caught it here in the hospital" (09/25/2012)  . Chronic kidney disease    "from my sickle cell" (09/25/2012)  . CKD (chronic kidney disease), stage II   . GERD (gastroesophageal reflux disease)    "after I eat alot of spicey foods" (09/25/2012)  . Gynecomastia, male 07/10/2012  . History of blood transfusion    "always related to sickle cell crisis" (09/25/2012)  . 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   Noted in noted from Hematology notes at [facility]  . Migraines    "take RX qd to prevent them"  (09/25/2012)  . Sickle cell anemia (HCC)   . Sickle cell crisis ([place]) 09/25/2012  . Sickle cell nephropathy ([place]) 07/10/2012  . Sinus tachycardia   . Tachycardia with heart rate 121-140 beats per minute with ambulation 08/04/2016   Past Surgical History:  Procedure Laterality Date  . CHOLECYSTECTOMY  ~ 2012  . COLONOSCOPY N/A 04/23/2017   Procedure: COLONOSCOPY;  Surgeon: Irene Shipper, MD;  Location: [facility];  Service: Endoscopy;  Laterality: N/A;  . IR FLUORO GUIDE CV LINE RIGHT  12/17/2016  . IR REMOVAL TUN CV CATH W/O FL  12/21/2016  . IR US GUIDE VASC ACCESS RIGHT  12/17/2016  . spleenectomy       A IV Location/Drains/Wounds Patient Lines/Drains/Airways Status   Active Line/Drains/Airways    Name:   Placement date:   Placement time:   Site:   Days:   Peripheral IV 05/05/18 Right Forearm   05/05/18    1925    Forearm   less than 1   Incision (Closed) 04/25/17 Flank Left   04/25/17    1950     375          Intake/Output Last 24 hours No intake or output data in the 24 hours ending 05/05/18 2216  Labs/Imaging Results for orders placed or performed during the hospital encounter of 05/05/18 (from the past 48 hour(s))  CBC WITH DIFFERENTIAL     Status: Abnormal   Collection Time: 05/05/18  7:16 PM  Result Value Ref Range   WBC 8.2 4.0 - 10.5 K/uL   RBC 1.38 (L) 4.22 - 5.81 MIL/uL   Hemoglobin 6.1 (LL) 13.0 - 17.0 g/dL    Comment: REPEATED TO VERIFY THIS CRITICAL RESULT HAS VERIFIED AND BEEN CALLED TO Alanson Aly RN BY [facility] ON 03 22 2020 AT 2020, AND HAS BEEN READ BACK. CRITICAL RESULT VERIFED    HCT 17.3 (L) 39.0 - 52.0 %   MCV 125.4 (H) 80.0 - 100.0 fL   MCH 44.2 (H) 26.0 - 34.0 pg   MCHC 35.3 30.0 - 36.0 g/dL   RDW Not Measured 11.5 - 15.5 %   Platelets 234 150 - 400 K/uL   nRBC 1.1 (H) 0.0 - 0.2 %   Neutrophils Relative % 60 %   Neutro Abs 4.9 1.7 - 7.7 K/uL   Lymphocytes Relative 36 %   Lymphs Abs 2.9 0.7 - 4.0 K/uL   Monocytes Relative 3 %   Monocytes  Absolute 0.3 0.1 - 1.0 K/uL   Eosinophils Relative 1 %   Eosinophils Absolute 0.1 0.0 - 0.5 K/uL   Basophils Relative 0 %   Basophils Absolute 0.0 0.0 - 0.1 K/uL   Immature Granulocytes 0 %   Abs Immature Granulocytes 0.03 0.00 - 0.07 K/uL   Polychromasia PRESENT    Sickle Cells PRESENT    Stomatocytes PRESENT     Comment: Performed at [facility], [place] [address] [place], [place] [address]  Reticulocytes     Status: Abnormal   Collection Time: 05/05/18  7:16 PM  Result Value Ref Range   Retic Ct Pct 5.0 (H) 0.4 - 3.1 %   RBC. 1.38 (L) 4.22 - 5.81 MIL/uL   Retic Count, Absolute 69.6 19.0 - 186.0 K/uL   Immature Retic Fract 33.6 (H) 2.3 - 15.9 %    Comment: Performed at [facility], [place] [address] [place], [place] [address]  Comprehensive metabolic panel     Status: Abnormal   Collection Time: 05/05/18  7:16 PM  Result Value Ref Range   Sodium 139 135 - 145 mmol/L   Potassium 4.0 3.5 - 5.1 mmol/L   Chloride 111 98 - 111 mmol/L   CO2 20 (L) 22 - 32 mmol/L   Glucose, Bld 157 (H) 70 - 99 mg/dL   BUN 29 (H) 6 - 20 mg/dL   Creatinine, Ser 2.81 (H) 0.61 - 1.24 mg/dL   Calcium 7.4 (L) 8.9 - 10.3 mg/dL   Total Protein 5.6 (L) 6.5 - 8.1 g/dL   Albumin 2.2 (L) 3.5 - 5.0 g/dL   AST 61 (H) 15 - 41 U/L   ALT 42 0 - 44 U/L   Alkaline Phosphatase 303 (H) 38 - 126 U/L   Total Bilirubin 1.1 0.3 - 1.2 mg/dL   GFR calc non Af Amer 28 (L) >60 mL/min   GFR calc Af Amer 32 (L) >60 mL/min   Anion gap 8 5 - 15    Comment: Performed at [facility], [place] [address], [place], [place] [address]   Dg Chest 2 View  Result Date: 05/05/2018 CLINICAL DATA:  Sickle cell crisis.  Pain. EXAM: CHEST - 2 VIEW COMPARISON:  03/06/2018 FINDINGS: The lungs are clear without focal pneumonia, edema, pneumothorax or pleural effusion. The cardiopericardial silhouette is within normal limits for size. Bony changes compatible with history of sickle cell disease,  including avascular necrosis of the right humeral head. IMPRESSION: No active cardiopulmonary disease. Electronically  Signed   By: Misty Stanley M.D.   On: 05/05/2018 19:27    Pending Labs Unresulted Labs (From admission, onward)    Start     Ordered   05/06/18 0500  CBC with Differential/Platelet  Tomorrow morning,   R     05/05/18 2214   05/06/18 0500  Comprehensive metabolic panel  Tomorrow morning,   R     05/05/18 2214   05/05/18 2212  CBC  (heparin)  Once,   R    Comments:  Baseline for heparin therapy IF NOT ALREADY DRAWN.  Notify MD if PLT < 100 K.    05/05/18 2214   05/05/18 2212  Creatinine, serum  (heparin)  Once,   R    Comments:  Baseline for heparin therapy IF NOT ALREADY DRAWN.    05/05/18 2214   05/05/18 2208  HIV antibody (Routine Testing)  Once,   R     05/05/18 2214   05/05/18 2042  Type and screen [place]  Once,   STAT    Comments:  [place]    05/05/18 2041   05/05/18 1852  Rapid urine drug screen (hospital performed)  ONCE - STAT,   R     05/05/18 1851          Vitals/Pain Today's Vitals   05/05/18 2054 05/05/18 2100 05/05/18 2107 05/05/18 2130  BP: 116/60 113/76  116/71  Pulse: (!) 104 (!) 104  84  Resp: 16 14  (!) 9  Temp:      TempSrc:      SpO2: 98% 97%  98%  Weight:      Height:      PainSc:   10-Worst pain ever     Isolation Precautions No active isolations  Medications Medications  diphenhydrAMINE (BENADRYL) capsule 25-50 mg (has no administration in time range)  dextrose 5 %-0.45 % sodium chloride infusion (has no administration in time range)  oxyCODONE (OXYCONTIN) 12 hr tablet 15 mg (has no administration in time range)  nortriptyline (PAMELOR) capsule 25 mg (has no administration in time range)  folic acid (FOLVITE) tablet 1 mg (has no administration in time range)  senna-docusate (Senokot-S) tablet 1 tablet (has no administration in time range)  polyethylene glycol (MIRALAX /  GLYCOLAX) packet 17 g (has no administration in time range)  heparin injection 5,000 Units (has no administration in time range)  ketorolac (TORADOL) 15 MG/ML injection 15 mg (15 mg Intravenous Given 05/05/18 1921)  HYDROmorphone (DILAUDID) injection 2 mg (2 mg Intravenous Given 05/05/18 1921)    Or  HYDROmorphone (DILAUDID) injection 2 mg ( Subcutaneous See Alternative 05/05/18 1921)  HYDROmorphone (DILAUDID) injection 2 mg (2 mg Intravenous Given 05/05/18 1953)    Or  HYDROmorphone (DILAUDID) injection 2 mg ( Subcutaneous See Alternative 05/05/18 1953)  HYDROmorphone (DILAUDID) injection 2 mg (2 mg Intravenous Given 05/05/18 2030)    Or  HYDROmorphone (DILAUDID) injection 2 mg ( Subcutaneous See Alternative 05/05/18 2030)    Mobility walks     Focused Assessments Sickle cell crisis    R Recommendations: See Admitting Provider Note  Report given to:   Additional Notes: low hemoglobin.

## 2018-05-05 NOTE — ED Notes (Signed)
Pt stating his pain has not decreased at all, third dose given.

## 2018-05-05 NOTE — H&P (Signed)
History and Physical    [name] A [employer]. DQQ:[id number] DOB: 1980/08/25 DOA: 05/05/2018  PCP: Lanae Boast, FNP  Patient coming from: Home.  Chief Complaint: Pain.  HPI: [name] [name]. is a 38 y.o. male with history of sickle cell anemia and chronic kidney disease stage III presents to the ER with complaints of pain.  Pain is mostly in the lower extremities and back and across the chest.  Patient states the chest pain is not much disturbing.  Pain is mostly in the lower extremity which is bothering him most.  Pain as per the patient is typical of his sickle cell crisis.  Has been taking his hydroxyurea and long-acting pain relief medications.  Denies any fever chills productive cough headache visual symptoms focal deficits.  ED Course: In the ER patient was given multiple doses of pain relief medications.  Despite which patient was still having pain admitted for pain relief.  Hemoglobin is around 6.1.  Was not febrile and not hypoxic.  Chest x-ray did not show anything acute.  EKG showing sinus tachycardia.  Review of Systems: As per HPI, rest all negative.   Past Medical History:  Diagnosis Date  . Arachnoid Cyst of brain bilaterally    "2 really small ones in the back of my head; inside; saw them w/MRI" (09/25/2012)  . Bacterial pneumonia ~ 2012   "caught it here in the hospital" (09/25/2012)  . Chronic kidney disease    "from my sickle cell" (09/25/2012)  . CKD (chronic kidney disease), stage II   . GERD (gastroesophageal reflux disease)    "after I eat alot of spicey foods" (09/25/2012)  . Gynecomastia, male 07/10/2012  . History of blood transfusion    "always related to sickle cell crisis" (09/25/2012)  . 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   Noted in [facility] at [facility]  . Migraines    "take RX qd to prevent them" (09/25/2012)  . Sickle cell anemia (HCC)   . Sickle cell crisis ([place]) 09/25/2012  . Sickle cell nephropathy ([place])  07/10/2012  . Sinus tachycardia   . Tachycardia with heart rate 121-140 beats per minute with ambulation 08/04/2016    Past Surgical History:  Procedure Laterality Date  . CHOLECYSTECTOMY  ~ 2012  . COLONOSCOPY N/A 04/23/2017   Procedure: COLONOSCOPY;  Surgeon: Irene Shipper, MD;  Location: [facility];  Service: Endoscopy;  Laterality: N/A;  . IR FLUORO GUIDE CV LINE RIGHT  12/17/2016  . IR REMOVAL TUN CV CATH W/O FL  12/21/2016  . IR US GUIDE VASC ACCESS RIGHT  12/17/2016  . spleenectomy       reports that he has been smoking cigarettes. He has never used smokeless tobacco. He reports previous drug use. Drug: Marijuana. He reports that he does not drink alcohol.  Allergies  Allergen Reactions  . Nsaids Other (See Comments)    Kidney disease  . Morphine And Related Other (See Comments)    "real bad headaches"    Family History  Problem Relation Age of Onset  . Breast cancer Mother     Prior to Admission medications   Medication Sig Start Date End Date Taking? Authorizing Provider  folic acid (FOLVITE) 1 MG tablet Take 1 tablet (1 mg total) daily by mouth. 12/22/16  Yes Leana Gamer, MD  hydroxyurea (HYDREA) 500 MG capsule TAKE 3 CAPSULES (1,500 MG TOTAL) BY MOUTH DAILY. 03/28/18  Yes Lanae Boast, FNP  ibuprofen (ADVIL,MOTRIN) 200 MG tablet Take 600  mg by mouth daily as needed (severe pain).   Yes [provider]  nortriptyline (PAMELOR) 25 MG capsule TAKE 1 CAPSULE (25 MG TOTAL) BY MOUTH AT BEDTIME. 04/27/18  Yes Donnamae Jude, MD  oxyCODONE (OXYCONTIN) 15 mg 12 hr tablet Take 1 tablet (15 mg total) by mouth every 12 (twelve) hours for 30 days. 04/25/18 05/25/18 Yes Lanae Boast, FNP  Oxycodone HCl 10 MG TABS Take 1 tablet (10 mg total) by mouth every 6 (six) hours as needed for up to 15 days. 04/25/18 05/10/18 Yes Lanae Boast, FNP  ergocalciferol (DRISDOL) 1.25 MG (50000 UT) capsule Take 1 capsule (50,000 Units total) by mouth once a week. Patient not taking:  Reported on 05/05/2018 04/05/18   Lanae Boast, FNP  gabapentin (NEURONTIN) 300 MG capsule Take 1 capsule (300 mg total) by mouth 3 (three) times daily. Patient not taking: Reported on 04/25/2018 01/21/18   Lanae Boast, FNP    Physical Exam: Vitals:   05/05/18 2002 05/05/18 2054 05/05/18 2100 05/05/18 2130  BP: 113/74 116/60 113/76 116/71  Pulse: (!) 108 (!) 104 (!) 104 84  Resp: 16 16 14  (!) 9  Temp:      TempSrc:      SpO2: 98% 98% 97% 98%  Weight:      Height:          Constitutional: Moderately built and nourished. Vitals:   05/05/18 2002 05/05/18 2054 05/05/18 2100 05/05/18 2130  BP: 113/74 116/60 113/76 116/71  Pulse: (!) 108 (!) 104 (!) 104 84  Resp: 16 16 14  (!) 9  Temp:      TempSrc:      SpO2: 98% 98% 97% 98%  Weight:      Height:       Eyes: Anicteric no pallor. ENMT: No discharge from the ears eyes nose and mouth. Neck: No mass felt.  No neck rigidity. Respiratory: No rhonchi or crepitations. Cardiovascular: S1-S2 heard. Abdomen: Soft nontender bowel sounds present. Musculoskeletal: No edema.  No joint effusion. Skin: No rash. Neurologic: Alert awake oriented to time place and person.  Moves all extremities. Psychiatric: Appears normal per normal affect.   Labs on Admission: I have personally reviewed following labs and imaging studies  CBC: Recent Labs  Lab 05/05/18 1916  WBC 8.2  NEUTROABS 4.9  HGB 6.1*  HCT 17.3*  MCV 125.4*  PLT 937   Basic Metabolic Panel: Recent Labs  Lab 05/05/18 1916  NA 139  K 4.0  CL 111  CO2 20*  GLUCOSE 157*  BUN 29*  CREATININE 2.81*  CALCIUM 7.4*   GFR: Estimated Creatinine Clearance: 29.5 mL/min (A) (by C-G formula based on SCr of 2.81 mg/dL (H)). Liver Function Tests: Recent Labs  Lab 05/05/18 1916  AST 61*  ALT 42  ALKPHOS 303*  BILITOT 1.1  PROT 5.6*  ALBUMIN 2.2*   No results for input(s): LIPASE, AMYLASE in the last 168 hours. No results for input(s): AMMONIA in the last 168 hours.  Coagulation Profile: No results for input(s): INR, PROTIME in the last 168 hours. Cardiac Enzymes: No results for input(s): CKTOTAL, CKMB, CKMBINDEX, TROPONINI in the last 168 hours. BNP (last 3 results) No results for input(s): PROBNP in the last 8760 hours. HbA1C: No results for input(s): HGBA1C in the last 72 hours. CBG: No results for input(s): GLUCAP in the last 168 hours. Lipid Profile: No results for input(s): CHOL, HDL, LDLCALC, TRIG, CHOLHDL, LDLDIRECT in the last 72 hours. Thyroid Function Tests: No results for input(s):  TSH, T4TOTAL, FREET4, T3FREE, THYROIDAB in the last 72 hours. Anemia Panel: Recent Labs    05/05/18 1916  RETICCTPCT 5.0*   Urine analysis:    Component Value Date/Time   COLORURINE YELLOW 03/06/2018 1840   APPEARANCEUR CLEAR 03/06/2018 1840   LABSPEC 1.013 03/06/2018 1840   PHURINE 6.0 03/06/2018 1840   GLUCOSEU 50 (A) 03/06/2018 1840   HGBUR SMALL (A) 03/06/2018 1840   HGBUR small 03/10/2010 1445   BILIRUBINUR negative 04/25/2018 1013   BILIRUBINUR neg 03/28/2018 0916   KETONESUR negative 04/25/2018 [place] 03/06/2018 1840   PROTEINUR >=300 (A) 04/25/2018 1013   PROTEINUR Positive (A) 03/28/2018 0916   PROTEINUR >=300 (A) 03/06/2018 1840   UROBILINOGEN 1.0 04/25/2018 1013   UROBILINOGEN 0.2 05/11/2017 1335   NITRITE Negative 04/25/2018 1013   NITRITE neg 03/28/2018 0916   NITRITE NEGATIVE 03/06/2018 1840   LEUKOCYTESUR Negative 04/25/2018 1013   Sepsis Labs: @LABRCNTIP (procalcitonin:4,lacticidven:4) )No results found for this or any previous visit (from the past 240 hour(s)).   Radiological Exams on Admission: Dg Chest 2 View  Result Date: 05/05/2018 CLINICAL DATA:  Sickle cell crisis.  Pain. EXAM: CHEST - 2 VIEW COMPARISON:  03/06/2018 FINDINGS: The lungs are clear without focal pneumonia, edema, pneumothorax or pleural effusion. The cardiopericardial silhouette is within normal limits for size. Bony changes  compatible with history of sickle cell disease, including avascular necrosis of the right humeral head. IMPRESSION: No active cardiopulmonary disease. Electronically Signed   By: Misty Stanley M.D.   On: 05/05/2018 19:27    EKG: Independently reviewed.  Sinus tachycardia.  Assessment/Plan Principal Problem:   Sickle cell pain crisis ([place]) Active Problems:   Anemia   CKD (chronic kidney disease), stage III ([place])    1. Sickle cell pain crisis -we will continue patient's long-acting pain relief medication and I have placed patient on weight-based Dilaudid PCA.  Will hold hydroxyurea for now since patient has worsening anemia.  Not on any NSAIDs due to renal failure.  IV fluids. 2. Chronic kidney disease stage III creatinine mildly elevated from baseline.  Closely follow metabolic panel.  Avoid NSAIDs or nephrotoxic's. 3. Anemia secondary to sickle cell disease hemoglobin is around 6.1 holding hydroxyurea for now.  If there is any further decline may need transfusion.   DVT prophylaxis: Heparin. Code Status: Full code. Family Communication: Discussed with patient. Disposition Plan: Home. Consults called: None. Admission status: Inpatient.   [name] Patience MD [facility] Pager [phone].  If 7PM-7AM, please contact night-coverage [email or web address] Password [email or web address]  05/05/2018, 10:14 PM

## 2018-05-06 DIAGNOSIS — D5700 Hb-SS disease with crisis, unspecified: Principal | ICD-10-CM

## 2018-05-06 DIAGNOSIS — N183 Chronic kidney disease, stage 3 (moderate): Secondary | ICD-10-CM

## 2018-05-06 DIAGNOSIS — D72829 Elevated white blood cell count, unspecified: Secondary | ICD-10-CM

## 2018-05-06 LAB — CBC WITH DIFFERENTIAL/PLATELET
Abs Immature Granulocytes: 0.05 10*3/uL (ref 0.00–0.07)
Basophils Absolute: 0 10*3/uL (ref 0.0–0.1)
Basophils Relative: 0 %
Eosinophils Absolute: 0.1 10*3/uL (ref 0.0–0.5)
Eosinophils Relative: 1 %
HCT: 19.2 % — ABNORMAL LOW (ref 39.0–52.0)
Hemoglobin: 6.6 g/dL — CL (ref 13.0–17.0)
Immature Granulocytes: 0 %
Lymphocytes Relative: 63 %
Lymphs Abs: 9.2 10*3/uL — ABNORMAL HIGH (ref 0.7–4.0)
MCH: 44 pg — ABNORMAL HIGH (ref 26.0–34.0)
MCHC: 34.4 g/dL (ref 30.0–36.0)
MCV: 128 fL — ABNORMAL HIGH (ref 80.0–100.0)
Monocytes Absolute: 0.5 10*3/uL (ref 0.1–1.0)
Monocytes Relative: 3 %
Neutro Abs: 4.9 10*3/uL (ref 1.7–7.7)
Neutrophils Relative %: 33 %
Platelets: 236 10*3/uL (ref 150–400)
RBC: 1.5 MIL/uL — ABNORMAL LOW (ref 4.22–5.81)
WBC: 14.8 10*3/uL — ABNORMAL HIGH (ref 4.0–10.5)
nRBC: 0.9 % — ABNORMAL HIGH (ref 0.0–0.2)

## 2018-05-06 LAB — CBC
HCT: 22.8 % — ABNORMAL LOW (ref 39.0–52.0)
Hemoglobin: 7.7 g/dL — ABNORMAL LOW (ref 13.0–17.0)
MCH: 36.2 pg — ABNORMAL HIGH (ref 26.0–34.0)
MCHC: 33.8 g/dL (ref 30.0–36.0)
MCV: 107 fL — ABNORMAL HIGH (ref 80.0–100.0)
NRBC: 1.1 % — AB (ref 0.0–0.2)
PLATELETS: 183 10*3/uL (ref 150–400)
RBC: 2.13 MIL/uL — ABNORMAL LOW (ref 4.22–5.81)
WBC: 14 10*3/uL — ABNORMAL HIGH (ref 4.0–10.5)

## 2018-05-06 LAB — COMPREHENSIVE METABOLIC PANEL
ALT: 44 U/L (ref 0–44)
AST: 68 U/L — ABNORMAL HIGH (ref 15–41)
Albumin: 2.2 g/dL — ABNORMAL LOW (ref 3.5–5.0)
Alkaline Phosphatase: 295 U/L — ABNORMAL HIGH (ref 38–126)
Anion gap: 5 (ref 5–15)
BUN: 27 mg/dL — ABNORMAL HIGH (ref 6–20)
CO2: 20 mmol/L — ABNORMAL LOW (ref 22–32)
Calcium: 7.9 mg/dL — ABNORMAL LOW (ref 8.9–10.3)
Chloride: 111 mmol/L (ref 98–111)
Creatinine, Ser: 2.78 mg/dL — ABNORMAL HIGH (ref 0.61–1.24)
GFR calc Af Amer: 33 mL/min — ABNORMAL LOW (ref >60)
GFR calc non Af Amer: 28 mL/min — ABNORMAL LOW (ref >60)
Glucose, Bld: 102 mg/dL — ABNORMAL HIGH (ref 70–99)
Potassium: 4.4 mmol/L (ref 3.5–5.1)
Sodium: 136 mmol/L (ref 135–145)
Total Bilirubin: 0.9 mg/dL (ref 0.3–1.2)
Total Protein: 6 g/dL — ABNORMAL LOW (ref 6.5–8.1)

## 2018-05-06 LAB — PREPARE RBC (CROSSMATCH)

## 2018-05-06 LAB — HIV ANTIBODY (ROUTINE TESTING W REFLEX): HIV Screen 4th Generation wRfx: NONREACTIVE

## 2018-05-06 MED ORDER — HYDROMORPHONE HCL 2 MG/ML IJ SOLN
2.0000 mg | Freq: Once | INTRAMUSCULAR | Status: AC
  Administered 2018-05-06: 2 mg via INTRAVENOUS
  Filled 2018-05-06: qty 1

## 2018-05-06 MED ORDER — OXYCODONE HCL 5 MG PO TABS: 10.0000 mg | ORAL_TABLET | ORAL | Status: DC | PRN

## 2018-05-06 MED ORDER — ACETAMINOPHEN 325 MG PO TABS
650.0000 mg | ORAL_TABLET | Freq: Once | ORAL | Status: AC
  Administered 2018-05-06: 650 mg via ORAL
  Filled 2018-05-06: qty 2

## 2018-05-06 NOTE — Progress Notes (Addendum)
Subjective: [name] [name], a 38 year old male with a medical history significant for sickle cell anemia, chronic pain syndrome, chronic kidney disease stage III, history of polysubstance abuse, and tobacco dependence was admitted in sickle cell pain crisis.  Patient attributes current pain crisis to changes in weather.  Pain is primarily to low back, lower extremities and mid chest.  Patient states that chest pain is nonradiating.  The pain intensity is 7-8/10.  Patient continues to be afebrile and maintaining oxygen saturation above 90% on RA. Hemoglobin is 6.6, which is below patient's baseline of 7.0-8.0 g/dL.  Patient denies headache, chest pain, dysuria, abdominal pain, nausea, vomiting, or diarrhea.  Patient endorses shortness of breath.     Objective:  Vital signs in last 24 hours:  Vitals:   05/06/18 0604 05/06/18 0821 05/06/18 1017 05/06/18 1147  BP: 118/76  104/77   Pulse: 87  (!) 104   Resp: 10 11 18 10   Temp: 98.1 F (36.7 C)  98 F (36.7 C)   TempSrc: Oral  Oral   SpO2: 100% 100% 98% 100%  Weight:      Height:        Intake/Output from previous day:   Intake/Output Summary (Last 24 hours) at 05/06/2018 1236 Last data filed at 05/06/2018 9628 Gross per 24 hour  Intake 1593.33 ml  Output 800 ml  Net 793.33 ml    Physical Exam: General: Alert, awake, oriented x3, in no acute distress.  HEENT: [place]/AT PEERL, EOMI Neck: Trachea midline,  no masses, no thyromegal,y no JVD, no carotid bruit OROPHARYNX:  Moist, No exudate/ erythema/lesions.  Heart: Regular rate and rhythm, without murmurs, rubs, gallops, PMI non-displaced, no heaves or thrills on palpation.  Lungs: Clear to auscultation, no wheezing or rhonchi noted. No increased vocal fremitus resonant to percussion  Abdomen: Soft, nontender, nondistended, positive bowel sounds, no masses no hepatosplenomegaly noted..  Neuro: No focal neurological deficits noted cranial nerves II through XII grossly intact. DTRs 2+  bilaterally upper and lower extremities. Strength 5 out of 5 in bilateral upper and lower extremities. Musculoskeletal: No warm swelling or erythema around joints, no spinal tenderness noted. Psychiatric: Patient alert and oriented x3, good insight and cognition, good recent to remote recall. Lymph node survey: No cervical axillary or inguinal lymphadenopathy noted.  Lab Results:  Basic Metabolic Panel:    Component Value Date/Time   NA 136 05/06/2018 0504   NA 138 06/13/2017 1102   K 4.4 05/06/2018 0504   CL 111 05/06/2018 0504   CO2 20 (L) 05/06/2018 0504   BUN 27 (H) 05/06/2018 0504   BUN 18 06/13/2017 1102   CREATININE 2.78 (H) 05/06/2018 0504   CREATININE 1.45 (H) 05/12/2014 1430   GLUCOSE 102 (H) 05/06/2018 0504   CALCIUM 7.9 (L) 05/06/2018 0504   CBC:    Component Value Date/Time   WBC 14.8 (H) 05/06/2018 0504   HGB 6.6 (LL) 05/06/2018 0504   HGB 7.8 (L) 03/28/2018 0942   HCT 19.2 (L) 05/06/2018 0504   HCT 22.8 (L) 03/28/2018 0942   PLT 236 05/06/2018 0504   PLT 267 03/28/2018 0942   MCV 128.0 (H) 05/06/2018 0504   MCV 115 (H) 03/28/2018 0942   NEUTROABS 4.9 05/06/2018 0504   NEUTROABS 2.2 03/28/2018 0942   LYMPHSABS 9.2 (H) 05/06/2018 0504   LYMPHSABS 3.3 (H) 03/28/2018 0942   MONOABS 0.5 05/06/2018 0504   EOSABS 0.1 05/06/2018 0504   EOSABS 0.1 03/28/2018 0942   BASOSABS 0.0 05/06/2018 0504  BASOSABS 0.0 03/28/2018 0942    No results found for this or any previous visit (from the past 240 hour(s)).  Studies/Results: Dg Chest 2 View  Result Date: 05/05/2018 CLINICAL DATA:  Sickle cell crisis.  Pain. EXAM: CHEST - 2 VIEW COMPARISON:  03/06/2018 FINDINGS: The lungs are clear without focal pneumonia, edema, pneumothorax or pleural effusion. The cardiopericardial silhouette is within normal limits for size. Bony changes compatible with history of sickle cell disease, including avascular necrosis of the right humeral head. IMPRESSION: No active cardiopulmonary  disease. Electronically Signed   By: Misty Stanley M.D.   On: 05/05/2018 19:27    Medications: Scheduled Meds: . acetaminophen  650 mg Oral Once  . folic acid  1 mg Oral Daily  . heparin  5,000 Units Subcutaneous Q8H  . HYDROmorphone   Intravenous Q4H  . nortriptyline  25 mg Oral QHS  . oxyCODONE  15 mg Oral Q12H  . senna-docusate  1 tablet Oral BID   Continuous Infusions: . dextrose 5 % and 0.45% NaCl 100 mL/hr at 05/06/18 0604   PRN Meds:.diphenhydrAMINE, naloxone **AND** sodium chloride flush, polyethylene glycol   Procedures:  Transfuse 1 unit of packed red blood cells  Assessment/Plan: Principal Problem:   Sickle cell pain crisis (HCC) Active Problems:   Anemia   CKD (chronic kidney disease), stage III ([facility]CC)  Sickle cell anemia with pain crisis: Continue 0.45% saline at 100 mL/h Weight-based Dilaudid PCA Hold IV Toradol, avoid all nephrotoxic medications Monitor vital signs very closely Continue to reevaluate pain scale regularly Maintain oxygen saturation above 90%  Sickle cell anemia: Hold hydroxyurea, due to increased hemolysis Continue folic acid 1 mg daily Hemoglobin 6.6, which is below baseline.  Transfuse 1 unit of packed red blood cells CBC in a.m.  Chronic pain syndrome: Hold home pain medications.  Use PCA Dilaudid as substitute  Chronic kidney disease stage III: Creatinine mildly elevated from baseline. Follow creatinine Avoid all nephrotoxic medications  Leukocytosis: Hemoglobin 16.1, patient afebrile.  No signs of infection.  Continue to monitor closely CBC in a.m.  Tobacco dependence:  Counseled at length. Patient declines nicotine patch.     Code Status: Full Code Family Communication: N/A Disposition Plan: Not yet ready for discharge  [place], MSN, FNP-C Patient [place] [address] [place], [place] [address] [phone]  If 5PM-7AM, please contact  night-coverage.  05/06/2018, 12:36 PM  LOS: 1 day

## 2018-05-07 LAB — RAPID URINE DRUG SCREEN, HOSP PERFORMED
Amphetamines: NOT DETECTED
Barbiturates: NOT DETECTED
Benzodiazepines: NOT DETECTED
COCAINE: POSITIVE — AB
OPIATES: POSITIVE — AB
Tetrahydrocannabinol: NOT DETECTED

## 2018-05-07 LAB — TYPE AND SCREEN
ABO/RH(D): O POS
ANTIBODY SCREEN: NEGATIVE
Donor AG Type: NEGATIVE
Unit division: 0

## 2018-05-07 LAB — BPAM RBC
Blood Product Expiration Date: 202004162359
ISSUE DATE / TIME: 202003231532
Unit Type and Rh: 5100

## 2018-05-07 MED ORDER — OXYCODONE HCL 5 MG PO TABS
10.0000 mg | ORAL_TABLET | ORAL | Status: DC
  Administered 2018-05-07: 10 mg via ORAL
  Filled 2018-05-07: qty 2

## 2018-05-07 MED ORDER — OXYCODONE HCL 5 MG PO TABS
15.0000 mg | ORAL_TABLET | ORAL | Status: DC
  Administered 2018-05-07 – 2018-05-08 (×4): 15 mg via ORAL
  Filled 2018-05-07 (×4): qty 3

## 2018-05-07 MED ORDER — DEXTROSE-NACL 5-0.45 % IV SOLN
INTRAVENOUS | Status: AC
  Administered 2018-05-07: 06:00:00 via INTRAVENOUS
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
## 2018-05-07 NOTE — Progress Notes (Signed)
Subjective: [name] [name], a 38 year old male with a medical history significant for sickle cell anemia, chronic pain syndrome, chronic kidney disease stage III, history of polysubstance abuse, and tobacco dependence was admitted in sickle cell pain crisis.  Patient was transfused 1 unit of packed red blood cells.  Hemoglobin improved to 7.7 following transfusion.  Unable to obtain labs this a.m. due to poor venous access.  Patient has a history of polysubstance abuse, urine drug screen positive for cocaine.  Current pain intensity 7/10 primarily to low back and lower extremities.  Patient states the pain has improved minimally overnight.  IV Dilaudid PCA discontinued and patient transition to oral medications.  WBCs 14.0.  Patient afebrile and maintaining oxygen saturation at 100% on RA.  Patient denies headache, shortness of breath, chest pain, dysuria, abdominal pain, nausea, vomiting, or diarrhea.  Objective:  Vital signs in last 24 hours:  Vitals:   05/07/18 0403 05/07/18 0428 05/07/18 0816 05/07/18 0948  BP:  109/78  107/73  Pulse:  78  90  Resp: 15 16 16 18   Temp:  98.1 F (36.7 C)  98.9 F (37.2 C)  TempSrc:  Oral  Oral  SpO2: 99% 100% 94% 100%  Weight:      Height:        Intake/Output from previous day:   Intake/Output Summary (Last 24 hours) at 05/07/2018 1223 Last data filed at 05/07/2018 1100 Gross per 24 hour  Intake 4328.33 ml  Output 2340 ml  Net 1988.33 ml    Physical Exam: General: Alert, awake, oriented x3, in no acute distress.  HEENT: [place]/AT PEERL, EOMI Neck: Trachea midline,  no masses, no thyromegal,y no JVD, no carotid bruit OROPHARYNX:  Moist, No exudate/ erythema/lesions.  Heart: Regular rate and rhythm, without murmurs, rubs, gallops, PMI non-displaced, no heaves or thrills on palpation.  Lungs: Clear to auscultation, no wheezing or rhonchi noted. No increased vocal fremitus resonant to percussion  Abdomen: Soft, nontender, nondistended,  positive bowel sounds, no masses no hepatosplenomegaly noted..  Neuro: No focal neurological deficits noted cranial nerves II through XII grossly intact. DTRs 2+ bilaterally upper and lower extremities. Strength 5 out of 5 in bilateral upper and lower extremities. Musculoskeletal: No warm swelling or erythema around joints, no spinal tenderness noted. Psychiatric: Patient alert and oriented x3, good insight and cognition, good recent to remote recall. Lymph node survey: No cervical axillary or inguinal lymphadenopathy noted.  Lab Results:  Basic Metabolic Panel:    Component Value Date/Time   NA 136 05/06/2018 0504   NA 138 06/13/2017 1102   K 4.4 05/06/2018 0504   CL 111 05/06/2018 0504   CO2 20 (L) 05/06/2018 0504   BUN 27 (H) 05/06/2018 0504   BUN 18 06/13/2017 1102   CREATININE 2.78 (H) 05/06/2018 0504   CREATININE 1.45 (H) 05/12/2014 1430   GLUCOSE 102 (H) 05/06/2018 0504   CALCIUM 7.9 (L) 05/06/2018 0504   CBC:    Component Value Date/Time   WBC 14.0 (H) 05/06/2018 2027   HGB 7.7 (L) 05/06/2018 2027   HGB 7.8 (L) 03/28/2018 0942   HCT 22.8 (L) 05/06/2018 2027   HCT 22.8 (L) 03/28/2018 0942   PLT 183 05/06/2018 2027   PLT 267 03/28/2018 0942   MCV 107.0 (H) 05/06/2018 2027   MCV 115 (H) 03/28/2018 0942   NEUTROABS 4.9 05/06/2018 0504   NEUTROABS 2.2 03/28/2018 0942   LYMPHSABS 9.2 (H) 05/06/2018 0504   LYMPHSABS 3.3 (H) 03/28/2018 0942   MONOABS 0.5  05/06/2018 0504   EOSABS 0.1 05/06/2018 0504   EOSABS 0.1 03/28/2018 0942   BASOSABS 0.0 05/06/2018 0504   BASOSABS 0.0 03/28/2018 0942    No results found for this or any previous visit (from the past 240 hour(s)).  Studies/Results: Dg Chest 2 View  Result Date: 05/05/2018 CLINICAL DATA:  Sickle cell crisis.  Pain. EXAM: CHEST - 2 VIEW COMPARISON:  03/06/2018 FINDINGS: The lungs are clear without focal pneumonia, edema, pneumothorax or pleural effusion. The cardiopericardial silhouette is within normal limits for  size. Bony changes compatible with history of sickle cell disease, including avascular necrosis of the right humeral head. IMPRESSION: No active cardiopulmonary disease. Electronically Signed   By: Misty Stanley M.D.   On: 05/05/2018 19:27    Medications: Scheduled Meds: . folic acid  1 mg Oral Daily  . heparin  5,000 Units Subcutaneous Q8H  . nortriptyline  25 mg Oral QHS  . oxyCODONE  10 mg Oral Q4H while awake  . oxyCODONE  15 mg Oral Q12H  . senna-docusate  1 tablet Oral BID   Continuous Infusions: PRN Meds:.diphenhydrAMINE, polyethylene glycol  Procedures:  S/P one unit of PRBCs   Assessment/Plan: Principal Problem:   Sickle cell pain crisis (HCC) Active Problems:   Leukocytosis   Anemia   CKD (chronic kidney disease), stage III (HCC)   Sickle cell anemia with pain crisis: Continue IV fluids at [place] weight-based Dilaudid PCA due to positive drug screen Oxycodone 15 mg by mouth every 4 hours while awake Also continue OxyContin 20 mg every 12 hours Hold IV Toradol due to history of chronic kidney disease Maintain oxygen saturation above 90% Discharge planning for 05/08/2018  Anemia of chronic disease: Hemoglobin improved to 7.7, which is consistent with patient's baseline. Continue to follow CBC  Leukocytosis: WBCs 14.0, patient afebrile, no signs of infection. Repeat CBC in a.m.  Chronic pain syndrome: Continue home medications  Chronic kidney disease stage III: Creatinine stable. BMP in a.m. Avoid all nephrotoxic medications  Polysubstance abuse: Urine drug screen positive for cocaine.  Discussed at length.  Patient states that he uses cocaine periodically, he declines outpatient treatment.  Discussed at length.  Patient became irritable, states that he is not addicted to cocaine.  Tobacco dependence: Patient counseled at length on admission.  He declined nicotine patches.    Code Status: Full Code Family Communication: N/A Disposition  Plan: Not yet ready for discharge    [place], MSN, FNP-C [facility] [address] [place], [place] [address] [phone]  If 5PM-7AM, please contact night-coverage.  05/07/2018, 12:23 PM  LOS: 2 days

## 2018-05-08 NOTE — Discharge Instructions (Addendum)
Resume all home medications.  Continue to hydrate with 64 ounces of fluid daily.  Increase rest and handwashing.  Avoid all stressors that precipitate sickle cell pain crisis.   Sickle Cell Anemia, Adult Sickle cell anemia is a condition where your red blood cells are shaped like sickles. Red blood cells carry oxygen through the body. Sickle-shaped cells do not live as long as normal red blood cells. They also clump together and block blood from flowing through the blood vessels. This prevents the body from getting enough oxygen. Sickle cell anemia causes organ damage and pain. It also increases the risk of infection. Follow these instructions at home: Medicines  Take over-the-counter and prescription medicines only as told by your doctor.  If you were prescribed an antibiotic medicine, take it as told by your doctor. Do not stop taking the antibiotic even if you start to feel better.  If you develop a fever, do not take medicines to lower the fever right away. Tell your doctor about the fever. Managing pain, stiffness, and swelling  Try these methods to help with pain: ? Use a heating pad. ? Take a warm bath. ? Distract yourself, such as by watching TV. Eating and drinking  Drink enough fluid to keep your pee (urine) clear or pale yellow. Drink more in hot weather and during exercise.  Limit or avoid alcohol.  Eat a healthy diet. Eat plenty of fruits, vegetables, whole grains, and lean protein.  Take vitamins and supplements as told by your doctor. Traveling  When traveling, keep these with you: ? Your medical information. ? The names of your doctors. ? Your medicines.  If you need to take an airplane, talk to your doctor first. Activity  Rest often.  Avoid exercises that make your heart beat much faster, such as jogging. General instructions  Do not use products that have nicotine or tobacco, such as cigarettes and e-cigarettes. If you need help quitting, ask your  doctor.  Consider wearing a medical alert bracelet.  Avoid being in high places (high altitudes), such as mountains.  Avoid very hot or cold temperatures.  Avoid places where the temperature changes a lot.  Keep all follow-up visits as told by your doctor. This is important. Contact a doctor if:  A joint hurts.  Your feet or hands hurt or swell.  You feel tired (fatigued). Get help right away if:  You have symptoms of infection. These include: ? Fever. ? Chills. ? Being very tired. ? Irritability. ? Poor eating. ? Throwing up (vomiting).  You feel dizzy or faint.  You have new stomach pain, especially on the left side.  You have a an erection (priapism) that lasts more than 4 hours.  You have numbness in your arms or legs.  You have a hard time moving your arms or legs.  You have trouble talking.  You have pain that does not go away when you take medicine.  You are short of breath.  You are breathing fast.  You have a long-term cough.  You have pain in your chest.  You have a bad headache.  You have a stiff neck.  Your stomach looks bloated even though you did not eat much.  Your skin is pale.  You suddenly cannot see well. Summary  Sickle cell anemia is a condition where your red blood cells are shaped like sickles.  Follow your doctor's advice on ways to manage pain, food to eat, activities to do, and steps to take for safe  travel.  Get medical help right away if you have any signs of infection, such as a fever. This information is not intended to replace advice given to you by your health care provider. Make sure you discuss any questions you have with your health care provider. Document Released: 11/20/2012 Document Revised: 03/07/2016 Document Reviewed: 03/07/2016 Elsevier Interactive Patient Education  2019 [employer].

## 2018-05-08 NOTE — Care Management Important Message (Signed)
Important Message  Patient Details  Name: [name] [name]. MRN: [id number] Date of Birth: Mar 15, 1980   Medicare Important Message Given:  Yes    Kerin Salen 05/08/2018, 10:58 AMImportant Message  Patient Details  Name: [name] [name]. MRN: [id number] Date of Birth: 1980-10-09   Medicare Important Message Given:  Yes    Kerin Salen 05/08/2018, 10:58 AM

## 2018-05-08 NOTE — Discharge Summary (Signed)
Physician Discharge Summary  [name] [name]. DJM:[id number] DOB: 1980-12-05 DOA: 05/05/2018  PCP: Lanae Boast, FNP  Admit date: 05/05/2018  Discharge date: 05/08/2018  Discharge Diagnoses:  Principal Problem:   Sickle cell pain crisis ([place]) Active Problems:   Leukocytosis   Anemia   CKD (chronic kidney disease), stage III [facility])   Discharge Condition: Stable  Disposition:  Pt is discharged home in good condition and is to follow up with Lanae Boast, FNP this week to have labs evaluated. [name] A Lengyel Jr. is instructed to increase activity slowly and balance with rest for the next few days, and use prescribed medication to complete treatment of pain  Diet: Regular Wt Readings from Last 3 Encounters:  05/05/18 59 kg  04/25/18 57.2 kg  03/28/18 57.6 kg    History of present illness:  [name] [name], a 38 year old male with a medical history significant for sickle cell anemia, chronic pain syndrome, history of polysubstance abuse, opiate dependence, and history of anemia of chronic disease presented to the ER with complaints of generalized pain.  Pain was mostly in the lower extremities and low back.  Patient also endorsed mid chest pain.  Patient states that chest pain is minimal.  Patient has been taking hydroxyurea and long-acting pain medications without sustained relief.  He denies any fever, chills, productive cough, headache, visual symptoms, or focal deficits.  ER course: In ER, patient was given multiple doses of IV Dilaudid, and IV fluids.  Patient continued to have extreme pain, despite treatment.  Hemoglobin is around 6.1.  Patient afebrile and maintaining oxygen saturation above 90%.  Chest x-ray showed no acute cardiopulmonary process.  EKG shows sinus tachycardia, which is consistent with previous.  Patient admitted to inpatient services for sickle cell pain crisis.   Hospital Course:   Sickle cell pain crisis: Patient was admitted in sickle cell  pain crisis and managed appropriately with IV fluids, IV Dilaudid via PCA as well as other adjunct therapies.  Urine drug screen showed positive cocaine.  IV Dilaudid was discontinued and home medications were initiated.  Patient became angry when IV medications were discontinued. He says that his pain is typically not controlled with oral medications. Oxycodone 15 mg every 4 hours while awake and OxyContin 20 mg every 12 hours were continued without interruption. Patient requested discharge shortly following initiation of oral pain medications.   Urine drug screen positive for cocaine. Patient counseled at length. He became angry and says that he is not addicted to cocaine and does not require help. He refused social work consult or further drug counseling.   Patient's hemoglobin was decreased at 6.1, which was below baseline of 7.0-8.0 g/dL.  He was transfused 1 unit of packed red blood cells.  Hemoglobin increased to 7.7 on 05/06/2018. Patient refused a.m. labs  Patient has a history of chronic kidney disease stage III. Supportive care and gentle hydration throughout admission. Avoided all nephrotoxic medications. Patient to follow up with outpatient nephrology as scheduled. He refused BMP this am.   Pain intensity 6-7/10 at discharge. Patient alert, oriented, and ambulating without assistance. Patient's home medication regimen discussed at length. He will follow up with PCP as scheduled.   Patient was discharged home today in a hemodynamically stable condition.   Discharge Exam: Vitals:   05/07/18 1805 05/07/18 2120  BP: 107/69 97/63  Pulse: 93 89  Resp: 20 16  Temp: 98.4 F (36.9 C) 98.7 F (37.1 C)  SpO2: 100% 99%   Vitals:  05/07/18 0948 05/07/18 1314 05/07/18 1805 05/07/18 2120  BP: 107/73 (!) 98/52 107/69 97/63  Pulse: 90 95 93 89  Resp: 18 20 20 16   Temp: 98.9 F (37.2 C) 98.2 F (36.8 C) 98.4 F (36.9 C) 98.7 F (37.1 C)  TempSrc: Oral Oral Oral Oral  SpO2: 100% 97% 100%  99%  Weight:      Height:       Physical Exam Constitutional:      Appearance: Normal appearance.  HENT:     Head: Normocephalic.     Nose: Nose normal.  Neck:     Musculoskeletal: Normal range of motion.  Cardiovascular:     Rate and Rhythm: Normal rate and regular rhythm.     Pulses: Normal pulses.     Heart sounds: Normal heart sounds.  Abdominal:     General: Abdomen is flat. Bowel sounds are normal.  Musculoskeletal: Normal range of motion.  Skin:    General: Skin is warm and dry.  Neurological:     General: No focal deficit present.     Mental Status: He is alert. Mental status is at baseline.  Psychiatric:        Mood and Affect: Mood normal.        Behavior: Behavior normal.        Thought Content: Thought content normal.        Judgment: Judgment normal.     Discharge Instructions  Discharge Instructions    Discharge patient   Complete by:  As directed    Discharge disposition:  01-Home or Self Care   Discharge patient date:  05/08/2018     Allergies as of 05/08/2018      Reactions   Nsaids Other (See Comments)   Kidney disease   Morphine And Related Other (See Comments)   "real bad headaches"      Medication List    TAKE these medications   ergocalciferol 1.25 MG (50000 UT) capsule Commonly known as:  Drisdol Take 1 capsule (50,000 Units total) by mouth once a week.   folic acid 1 MG tablet Commonly known as:  FOLVITE Take 1 tablet (1 mg total) daily by mouth.   gabapentin 300 MG capsule Commonly known as:  NEURONTIN Take 1 capsule (300 mg total) by mouth 3 (three) times daily.   hydroxyurea 500 MG capsule Commonly known as:  HYDREA TAKE 3 CAPSULES (1,500 MG TOTAL) BY MOUTH DAILY.   ibuprofen 200 MG tablet Commonly known as:  ADVIL,MOTRIN Take 600 mg by mouth daily as needed (severe pain).   nortriptyline 25 MG capsule Commonly known as:  PAMELOR TAKE 1 CAPSULE (25 MG TOTAL) BY MOUTH AT BEDTIME.   oxyCODONE 15 mg 12 hr  tablet Commonly known as:  OxyCONTIN Take 1 tablet (15 mg total) by mouth every 12 (twelve) hours for 30 days.   Oxycodone HCl 10 MG Tabs Take 1 tablet (10 mg total) by mouth every 6 (six) hours as needed for up to 15 days.       The results of significant diagnostics from this hospitalization (including imaging, microbiology, ancillary and laboratory) are listed below for reference.    Significant Diagnostic Studies: Dg Chest 2 View  Result Date: 05/05/2018 CLINICAL DATA:  Sickle cell crisis.  Pain. EXAM: CHEST - 2 VIEW COMPARISON:  03/06/2018 FINDINGS: The lungs are clear without focal pneumonia, edema, pneumothorax or pleural effusion. The cardiopericardial silhouette is within normal limits for size. Bony changes compatible with history of sickle cell disease,  including avascular necrosis of the right humeral head. IMPRESSION: No active cardiopulmonary disease. Electronically Signed   By: Misty Stanley M.D.   On: 05/05/2018 19:27    Microbiology: No results found for this or any previous visit (from the past 240 hour(s)).   Labs: Basic Metabolic Panel: Recent Labs  Lab 05/05/18 1916 05/06/18 0504  NA 139 136  K 4.0 4.4  CL 111 111  CO2 20* 20*  GLUCOSE 157* 102*  BUN 29* 27*  CREATININE 2.81* 2.78*  CALCIUM 7.4* 7.9*   Liver Function Tests: Recent Labs  Lab 05/05/18 1916 05/06/18 0504  AST 61* 68*  ALT 42 44  ALKPHOS 303* 295*  BILITOT 1.1 0.9  PROT 5.6* 6.0*  ALBUMIN 2.2* 2.2*   No results for input(s): LIPASE, AMYLASE in the last 168 hours. No results for input(s): AMMONIA in the last 168 hours. CBC: Recent Labs  Lab 05/05/18 1916 05/06/18 0504 05/06/18 2027  WBC 8.2 14.8* 14.0*  NEUTROABS 4.9 4.9  --   HGB 6.1* 6.6* 7.7*  HCT 17.3* 19.2* 22.8*  MCV 125.4* 128.0* 107.0*  PLT 234 236 183   Cardiac Enzymes: No results for input(s): CKTOTAL, CKMB, CKMBINDEX, TROPONINI in the last 168 hours. BNP: Invalid input(s): POCBNP CBG: No results for  input(s): GLUCAP in the last 168 hours.  Time coordinating discharge: 50 minutes  Signed:  [name]  APRN, MSN, FNP-C [facility] [address] [place], [place] [address] [phone]  [facility] 05/08/2018, 11:23 AM

## 2018-05-08 NOTE — Progress Notes (Signed)
Patient c/o of generalized pain, rates pain at 8/10 after receiving scheduled dose of pain medications. Patient request that on call physician be notified for IV pain medication. [facility] on call notified to discuss patient concerns, no change in medication ordered, patient agreeable to take PO pain medication and will speak with internal medicine team at next rounding.

## 2018-05-16 ENCOUNTER — Telehealth: Payer: Self-pay

## 2018-05-16 NOTE — Telephone Encounter (Signed)
Patient advise that [facility] by [facility] Darron Doom for a 90 day supply

## 2018-05-19 ENCOUNTER — Encounter (HOSPITAL_COMMUNITY): Payer: Self-pay | Admitting: *Deleted

## 2018-05-19 ENCOUNTER — Emergency Department (HOSPITAL_COMMUNITY)
Admission: EM | Admit: 2018-05-19 | Discharge: 2018-05-19 | Disposition: A | Discharge status: Home / Self Care | Payer: Medicare Other | Attending: Emergency Medicine | Admitting: Emergency Medicine

## 2018-05-19 ENCOUNTER — Other Ambulatory Visit: Payer: Self-pay

## 2018-05-19 DIAGNOSIS — M7918 Myalgia, other site: Secondary | ICD-10-CM

## 2018-05-19 DIAGNOSIS — F17210 Nicotine dependence, cigarettes, uncomplicated: Secondary | ICD-10-CM | POA: Insufficient documentation

## 2018-05-19 DIAGNOSIS — D5700 Hb-SS disease with crisis, unspecified: Secondary | ICD-10-CM | POA: Diagnosis not present

## 2018-05-19 DIAGNOSIS — M79602 Pain in left arm: Secondary | ICD-10-CM | POA: Diagnosis not present

## 2018-05-19 DIAGNOSIS — Z79899 Other long term (current) drug therapy: Secondary | ICD-10-CM | POA: Insufficient documentation

## 2018-05-19 DIAGNOSIS — N183 Chronic kidney disease, stage 3 (moderate): Secondary | ICD-10-CM | POA: Diagnosis not present

## 2018-05-19 DIAGNOSIS — Z720 Tobacco use: Secondary | ICD-10-CM

## 2018-05-19 DIAGNOSIS — M79601 Pain in right arm: Secondary | ICD-10-CM | POA: Diagnosis not present

## 2018-05-19 DIAGNOSIS — M79604 Pain in right leg: Secondary | ICD-10-CM | POA: Diagnosis not present

## 2018-05-19 LAB — URINALYSIS, ROUTINE W REFLEX MICROSCOPIC
Bacteria, UA: NONE SEEN
Bilirubin Urine: NEGATIVE
Glucose, UA: 150 mg/dL — AB
Hgb urine dipstick: NEGATIVE
Ketones, ur: NEGATIVE mg/dL
Leukocytes,Ua: NEGATIVE
Nitrite: NEGATIVE
Protein, ur: >=300 mg/dL — AB
Specific Gravity, Urine: 1.008 (ref 1.005–1.030)
pH: 7 (ref 5.0–8.0)

## 2018-05-19 LAB — COMPREHENSIVE METABOLIC PANEL
ALT: 51 U/L — ABNORMAL HIGH (ref 0–44)
AST: 55 U/L — ABNORMAL HIGH (ref 15–41)
Albumin: 2.3 g/dL — ABNORMAL LOW (ref 3.5–5.0)
Alkaline Phosphatase: 250 U/L — ABNORMAL HIGH (ref 38–126)
Anion gap: 4 — ABNORMAL LOW (ref 5–15)
BUN: 30 mg/dL — ABNORMAL HIGH (ref 6–20)
CO2: 18 mmol/L — ABNORMAL LOW (ref 22–32)
Calcium: 7.7 mg/dL — ABNORMAL LOW (ref 8.9–10.3)
Chloride: 113 mmol/L — ABNORMAL HIGH (ref 98–111)
Creatinine, Ser: 2.45 mg/dL — ABNORMAL HIGH (ref 0.61–1.24)
GFR calc Af Amer: 38 mL/min — ABNORMAL LOW (ref >60)
GFR calc non Af Amer: 33 mL/min — ABNORMAL LOW (ref >60)
Glucose, Bld: 87 mg/dL (ref 70–99)
Potassium: 4.4 mmol/L (ref 3.5–5.1)
Sodium: 135 mmol/L (ref 135–145)
Total Bilirubin: 1 mg/dL (ref 0.3–1.2)
Total Protein: 5.6 g/dL — ABNORMAL LOW (ref 6.5–8.1)

## 2018-05-19 LAB — CBC WITH DIFFERENTIAL/PLATELET
Abs Immature Granulocytes: 0.02 10*3/uL (ref 0.00–0.07)
Basophils Absolute: 0 10*3/uL (ref 0.0–0.1)
Basophils Relative: 0 %
Eosinophils Absolute: 0.1 10*3/uL (ref 0.0–0.5)
Eosinophils Relative: 1 %
HCT: 22.8 % — ABNORMAL LOW (ref 39.0–52.0)
Hemoglobin: 7.8 g/dL — ABNORMAL LOW (ref 13.0–17.0)
Immature Granulocytes: 0 %
Lymphocytes Relative: 48 %
Lymphs Abs: 4.4 10*3/uL — ABNORMAL HIGH (ref 0.7–4.0)
MCH: 37.1 pg — ABNORMAL HIGH (ref 26.0–34.0)
MCHC: 34.2 g/dL (ref 30.0–36.0)
MCV: 108.6 fL — ABNORMAL HIGH (ref 80.0–100.0)
Monocytes Absolute: 0.3 10*3/uL (ref 0.1–1.0)
Monocytes Relative: 4 %
Neutro Abs: 4.3 10*3/uL (ref 1.7–7.7)
Neutrophils Relative %: 47 %
Platelets: 254 10*3/uL (ref 150–400)
RBC: 2.1 MIL/uL — ABNORMAL LOW (ref 4.22–5.81)
WBC: 9.2 10*3/uL (ref 4.0–10.5)
nRBC: 0.2 % (ref 0.0–0.2)

## 2018-05-19 LAB — RETICULOCYTES
Immature Retic Fract: 36.8 % — ABNORMAL HIGH (ref 2.3–15.9)
RBC.: 2.1 MIL/uL — ABNORMAL LOW (ref 4.22–5.81)
Retic Count, Absolute: 67.4 10*3/uL (ref 19.0–186.0)
Retic Ct Pct: 3.2 % — ABNORMAL HIGH (ref 0.4–3.1)

## 2018-05-19 LAB — LACTATE DEHYDROGENASE: LDH: 185 U/L (ref 98–192)

## 2018-05-19 MED ORDER — DEXTROSE-NACL 5-0.45 % IV SOLN
INTRAVENOUS | Status: DC
  Administered 2018-05-19: 18:00:00 via INTRAVENOUS

## 2018-05-19 MED ORDER — HYDROMORPHONE HCL 2 MG/ML IJ SOLN: 2.0000 mg | INTRAMUSCULAR | Status: AC

## 2018-05-19 MED ORDER — HYDROMORPHONE HCL 2 MG/ML IJ SOLN
2.0000 mg | INTRAMUSCULAR | Status: AC
  Administered 2018-05-19: 19:00:00 2 mg via INTRAVENOUS
  Filled 2018-05-19: qty 1

## 2018-05-19 MED ORDER — HYDROMORPHONE HCL 2 MG/ML IJ SOLN
2.0000 mg | INTRAMUSCULAR | Status: AC
  Administered 2018-05-19: 2 mg via INTRAVENOUS
  Filled 2018-05-19: qty 1

## 2018-05-19 MED ORDER — HYDROMORPHONE HCL 2 MG/ML IJ SOLN
2.0000 mg | INTRAMUSCULAR | Status: AC
  Administered 2018-05-19: 18:00:00 2 mg via INTRAVENOUS
  Filled 2018-05-19: qty 1
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
## 2018-05-19 NOTE — ED Triage Notes (Signed)
SCC pain in legs since last night

## 2018-05-19 NOTE — Discharge Instructions (Signed)
Your labs are reassuring today. Use your home pain medications as needed for pain, but don't drive or operate machinery while taking narcotics; alternate between tylenol and motrin for additional pain relief. Stay well hydrated and get plenty of rest. Stay warm. STOP SMOKING! Follow up with your regular doctor in 2-3 days for recheck of symptoms and ongoing management of your sickle cell disease. Return to the ER for emergent changes or worsening symptoms.  °

## 2018-05-20 IMAGING — CR DG CHEST 2V
2 series · 2 of 2 positions shown · non-contrast
Comparison: October 17, 2014

CLINICAL DATA: Sickle cell disease with cough and chest pain

EXAM:
CHEST  2 VIEW

[w chest lat]
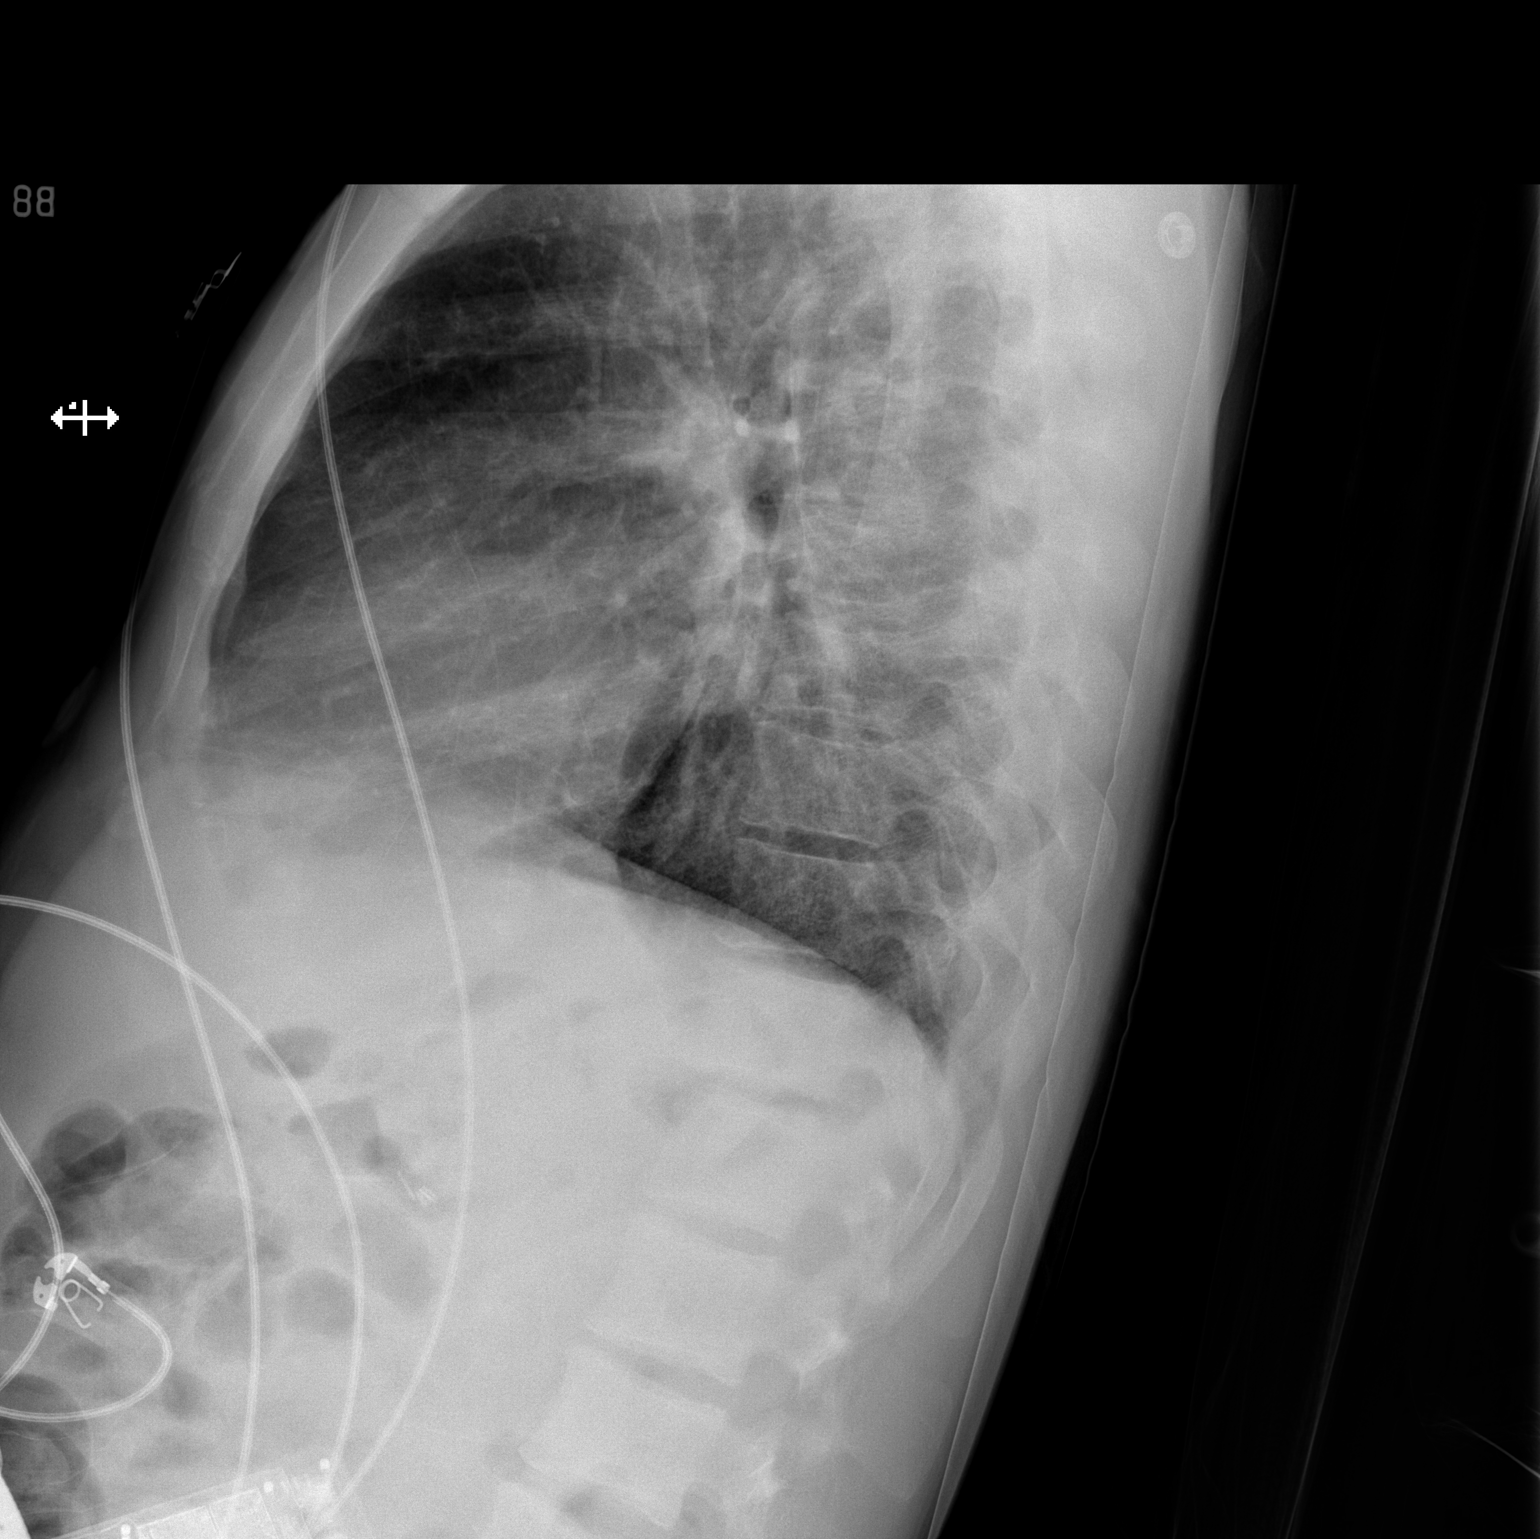

[x chest ap]
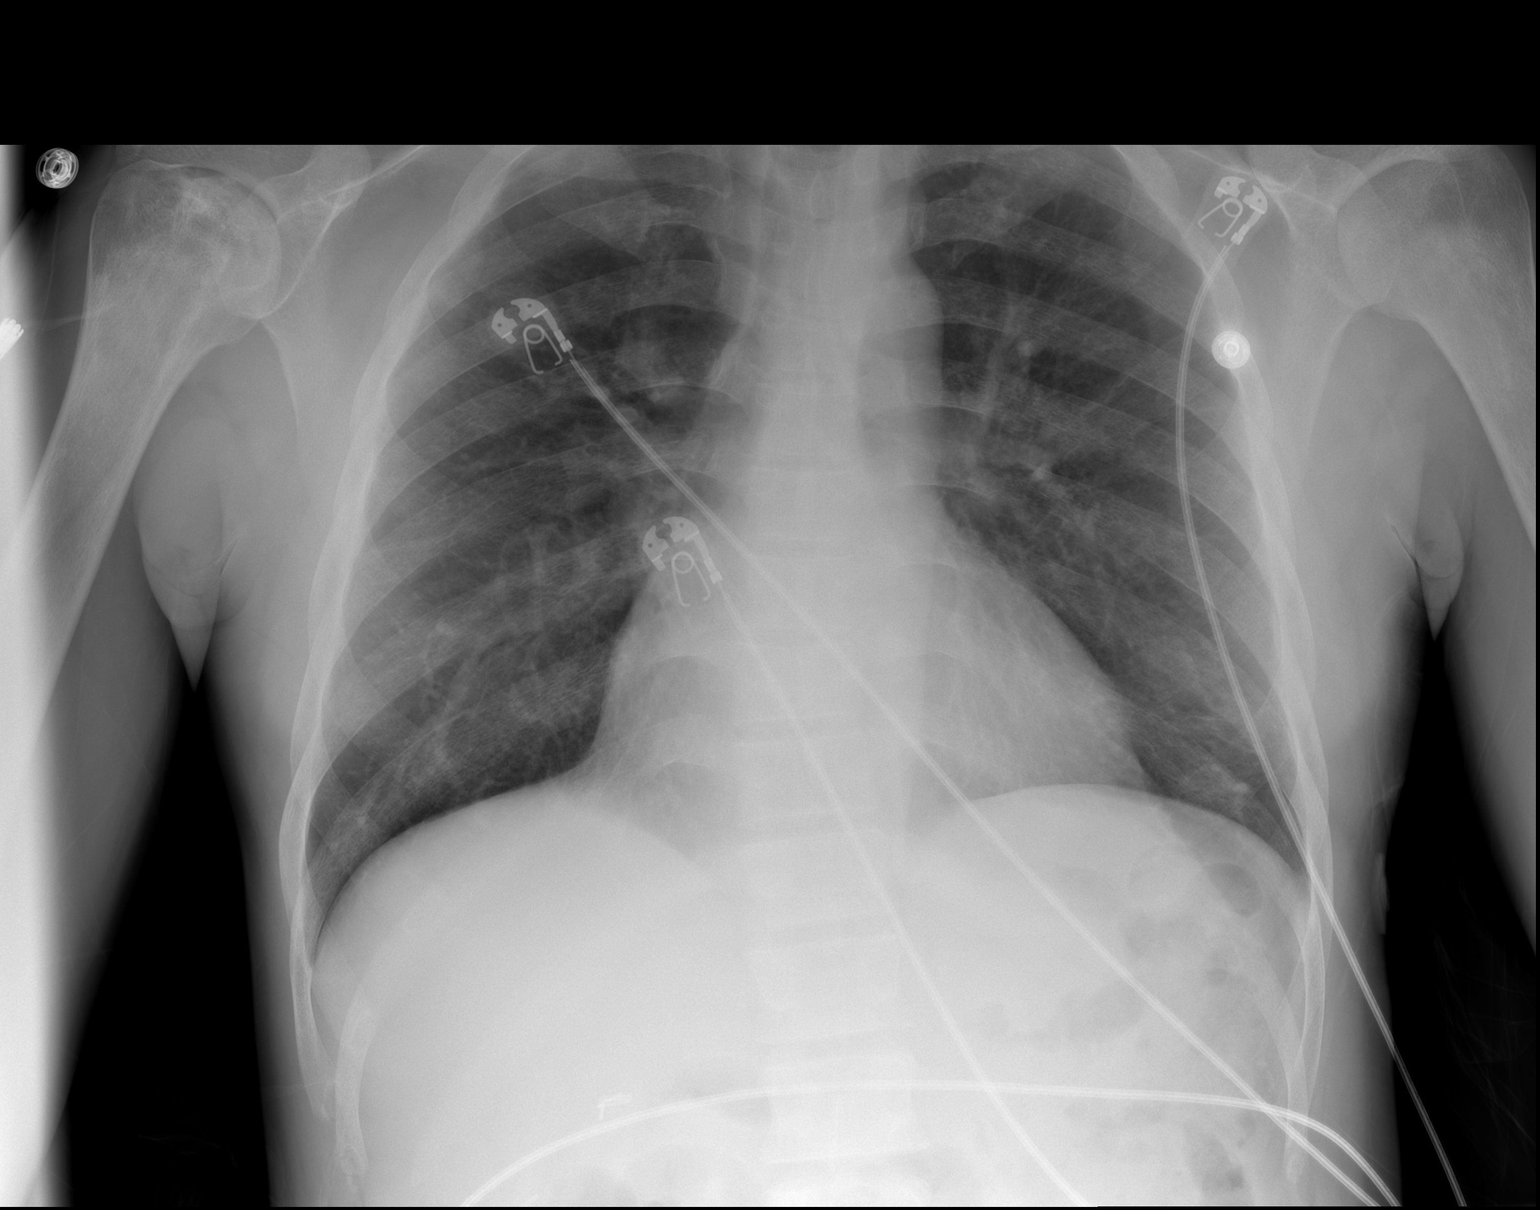

[2 of 2 positions shown; findings below may reference images not displayed]

FINDINGS: Lungs are clear. Heart size and pulmonary vascularity are normal. No
adenopathy. There is sclerosis in the proximal right humeral head
with changes suggesting a degree of avascular necrosis.
IMPRESSION: Findings felt to represent a degree of avascular necrosis in the
right humeral head. No edema or consolidation. Stable cardiac
silhouette.

## 2018-05-22 IMAGING — DX DG CHEST 2V
2 series · 2 of 2 positions shown · non-contrast
Comparison: 12/13/2016 and priors.

CLINICAL DATA: 35-year-old male with hypoxia and lethargy patient's
history of sickle cell disease.

EXAM:
CHEST  2 VIEW

[chest pa]
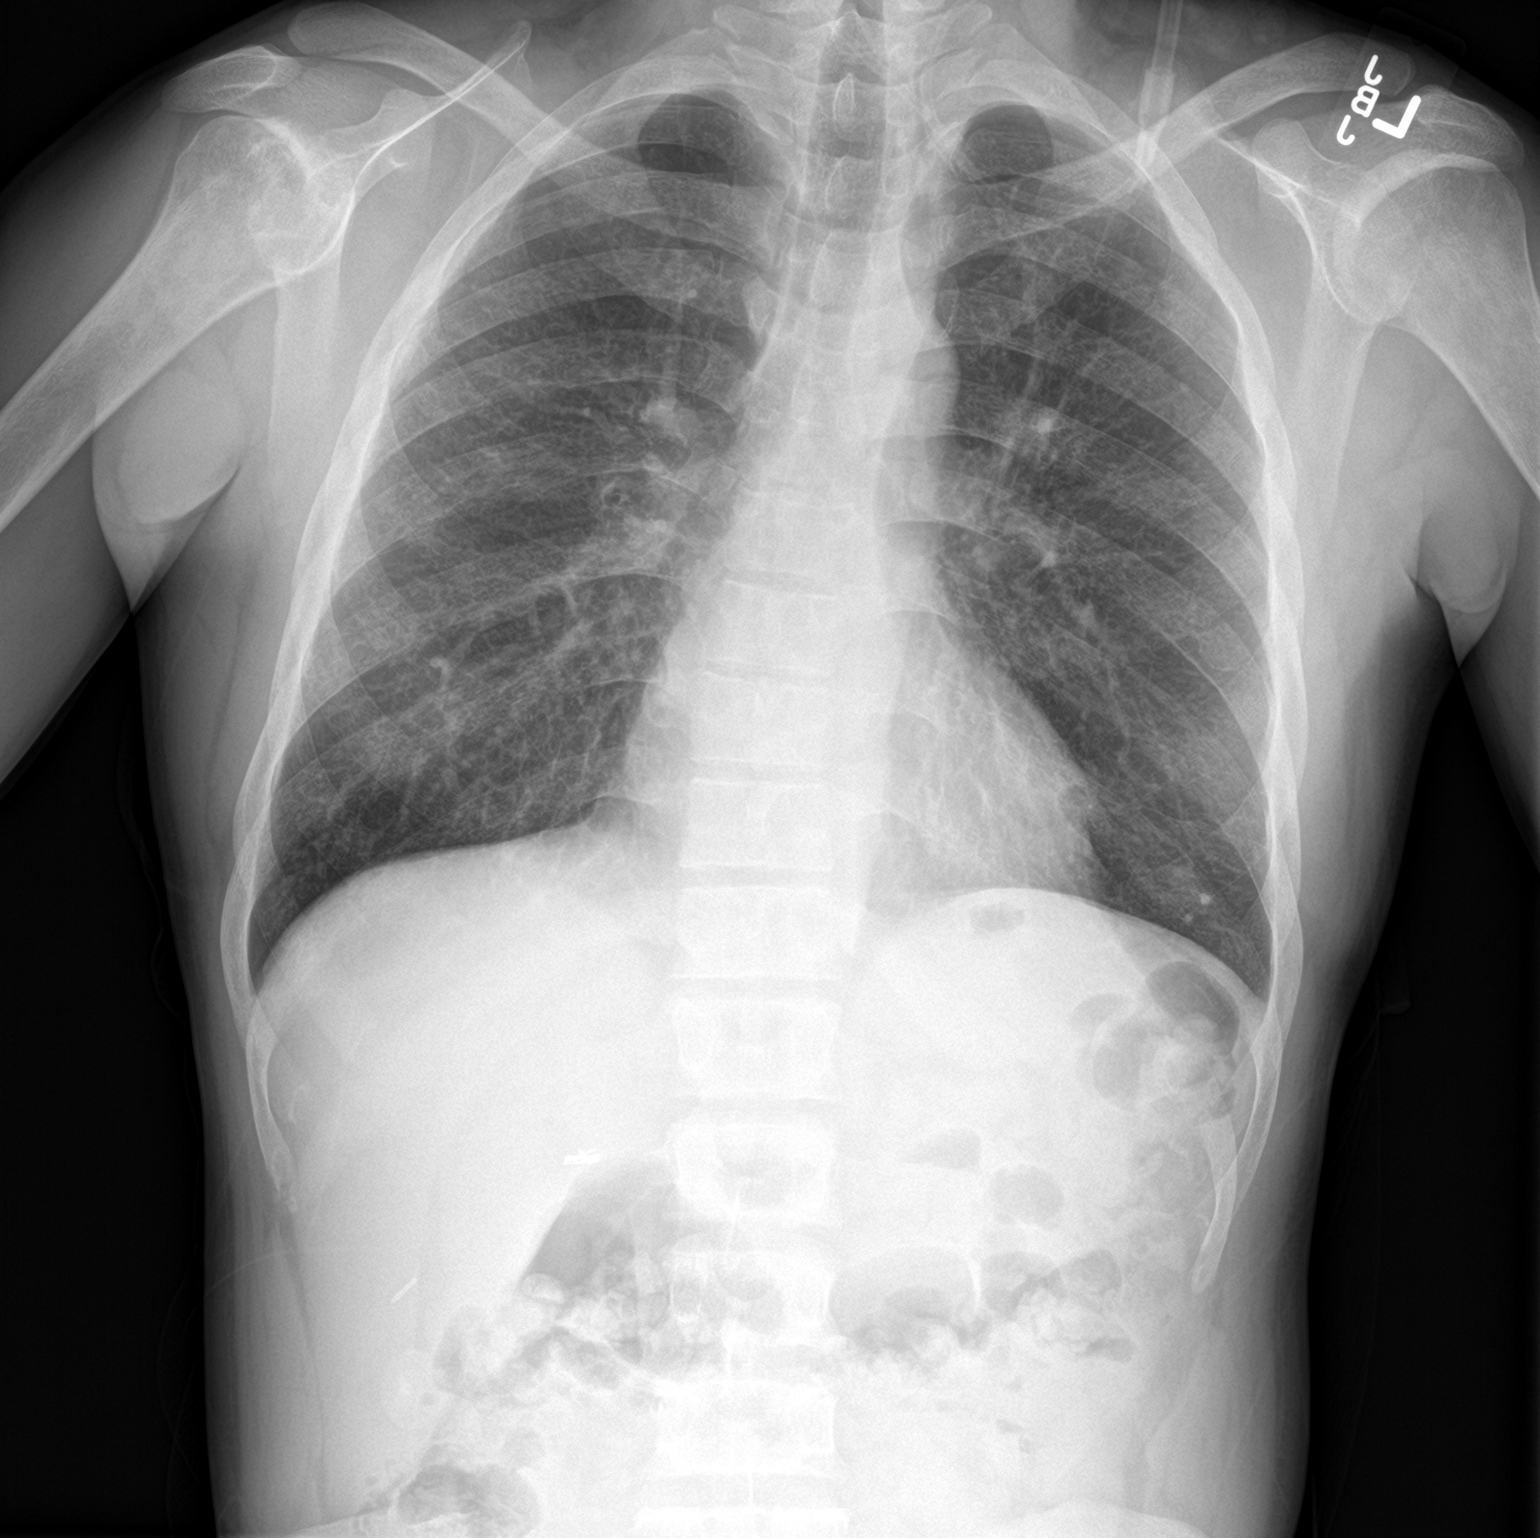

[chest lat]
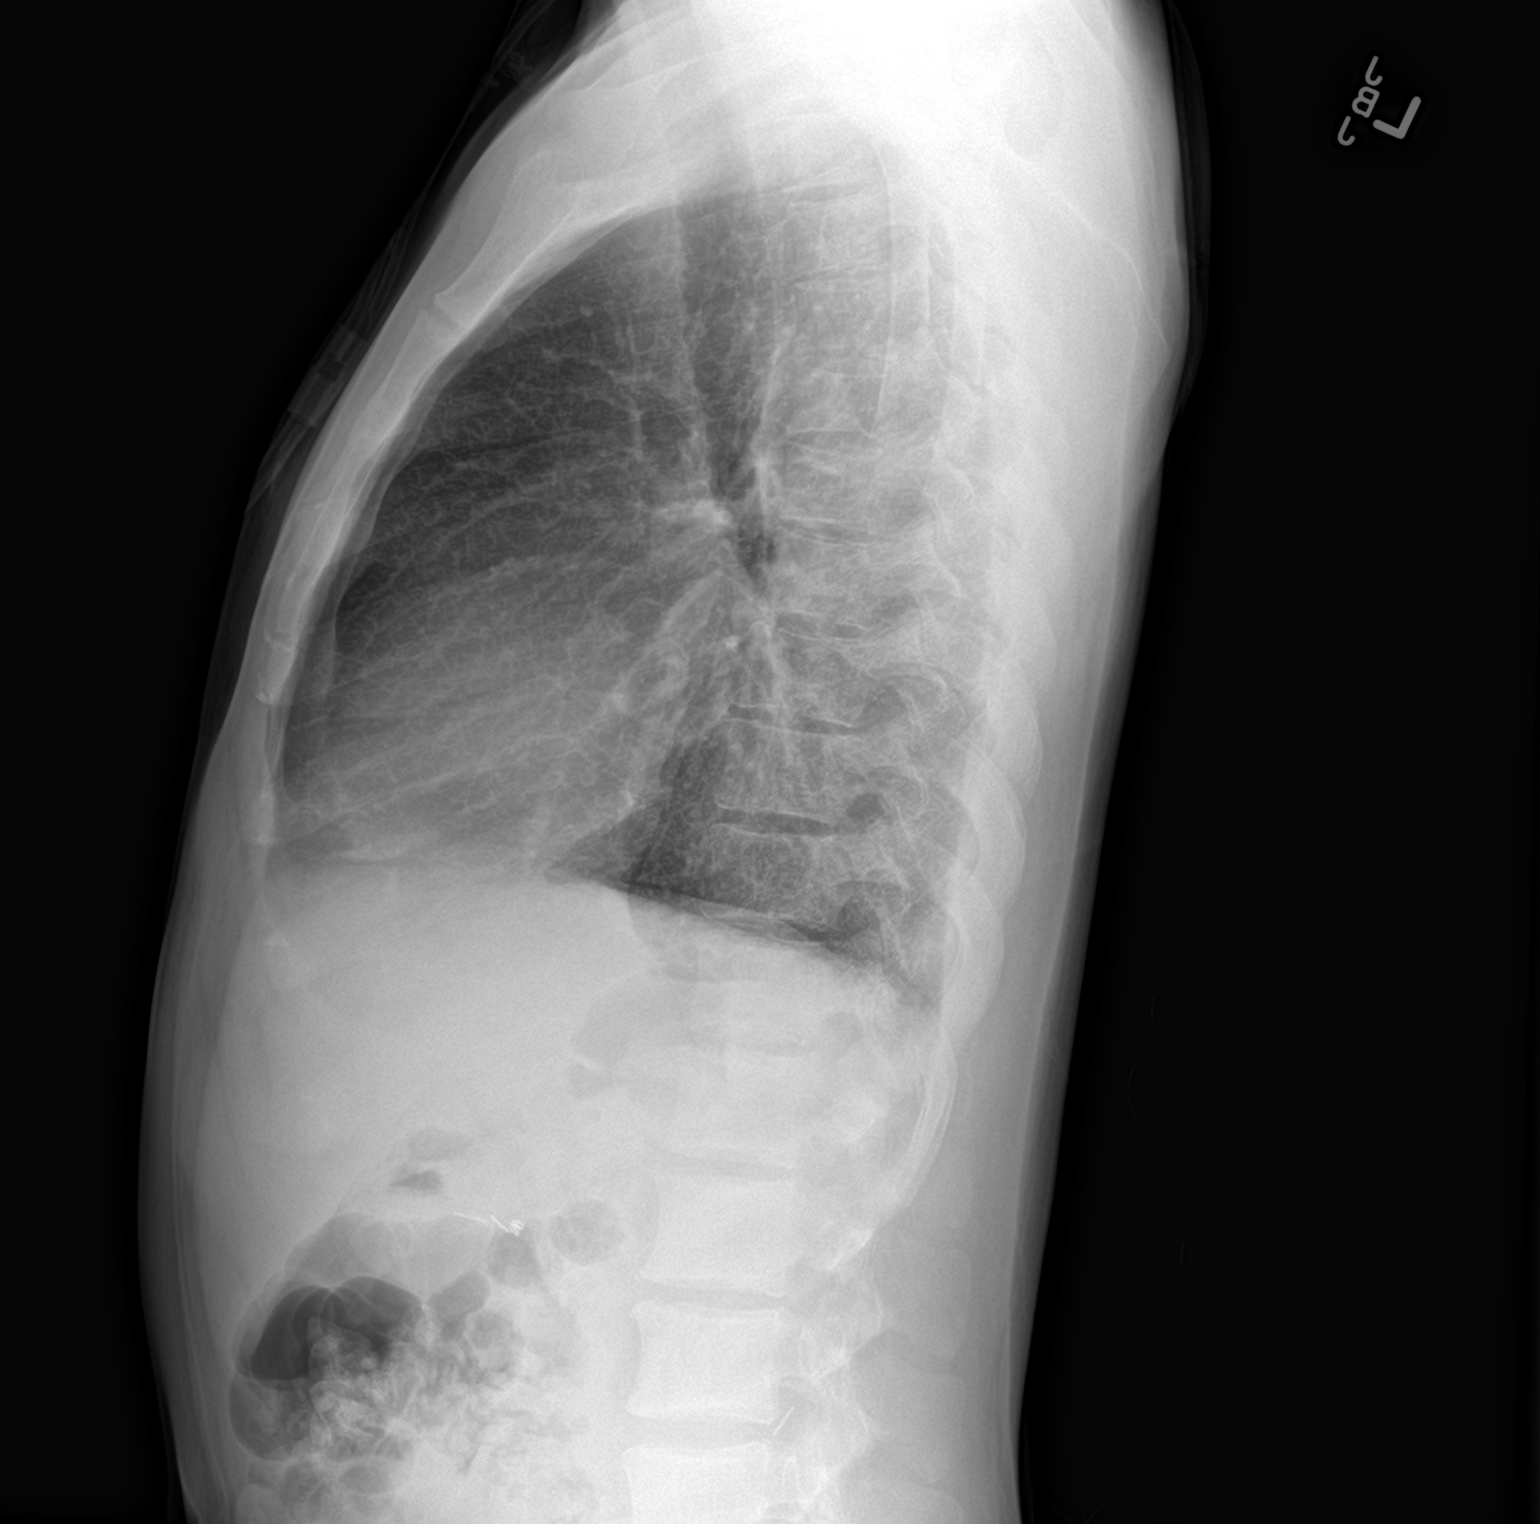

[2 of 2 positions shown; findings below may reference images not displayed]

FINDINGS: The heart size and mediastinal contours are within normal limits.
Prominent, chronic interstitial prominence is again noted. No focal
opacities, pleural effusion or pneumothorax.

Chronic avascular necrosis of the right humeral head again noted.
The visualized skeletal structures are otherwise unremarkable.
IMPRESSION: 1. No acute intrathoracic process.
2. Stable, chronic interstitial prominence.
3. Stable, chronic avascular necrosis of the right humeral head.

## 2018-05-23 ENCOUNTER — Other Ambulatory Visit: Payer: Self-pay

## 2018-05-23 ENCOUNTER — Ambulatory Visit: Payer: Medicare Other | Admitting: Family Medicine

## 2018-05-23 ENCOUNTER — Ambulatory Visit (INDEPENDENT_AMBULATORY_CARE_PROVIDER_SITE_OTHER): Payer: Medicare Other | Admitting: Family Medicine

## 2018-05-23 DIAGNOSIS — D571 Sickle-cell disease without crisis: Secondary | ICD-10-CM | POA: Diagnosis not present

## 2018-05-23 NOTE — Progress Notes (Signed)
  [name]  Virtual Visit via Telephone Note  I connected with [name] [name]. on 05/23/18 at  2:40 PM EDT by telephone and verified that I am speaking with the correct person using two identifiers.   I discussed the limitations, risks, security and privacy concerns of performing an evaluation and management service by telephone and the availability of in person appointments. I also discussed with the patient that there may be a patient responsible charge related to this service. The patient expressed understanding and agreed to proceed.   History of Present Illness Patient with a PMH of Sickle Cell Anemia type SS. He has a history of substance abuse. Since his last visit, he was admitted to the hospital and found to be positive for cocaine. He has been positive several time while being a patient at this clinic. He is requesting refills on his medications. He states that he is in mild pain today and is out of medications due to having to take more than prescribed.     Observations/Objective: Patient with regular voice tone, rate and rhythm. Speaking calmly and is in no apparent distress.     Assessment and Plan: 1. Sickle cell disease, type SS.  Discussed that this provider will no longer prescribe opioids for this patient. Gave the patient the option of drug rehabilitation, referral to another clinic such as [facility] or [facility], and referral to a pain clinic. Patient denies that he has a substance use problem and states that he will not go to rehab. He also states that he does not have adequate transportation to travel. Gave the patient the number and address to [facility] for possible transportation arrangements. Also patient was given the number to 2 pain clinics. Advised patient to continue with tylenol, hydration, and rest for pain. Discussed that cocaine is a stimulant and when combined with an opioid, this  increases the risk for stroke. Given the nature of SCD, he is already at an increased risk for stroke.  - Ambulatory referral to Pain Clinic     Follow Up Instructions:   We discussed hand washing, using hand sanitizer when soap and water are not available, only going out when absolutely necessary, and social distancing. Explained to patient that he is immunocompromised and will need to take precautions during this time.   I discussed the assessment and treatment plan with the patient. The patient was provided an opportunity to ask questions and all were answered. The patient agreed with the plan and demonstrated an understanding of the instructions.   The patient was advised to call back or seek an in-person evaluation if the symptoms worsen or if the condition fails to improve as anticipated.  I provided 25 minutes of non-face-to-face time during this encounter.  [name], FNP-BC Patient [facility] [address] [place], [place] [address] [phone]

## 2018-05-24 IMAGING — US IR US GUIDE VASC ACCESS RIGHT
1 series · 2 of 2 positions shown · non-contrast
Comparison: none

INDICATION: Sickle cell disorder.  No IV access.

[Series 1: ir (id) (id)/(id) · 2 of 2 slices shown]
[im 1/2]
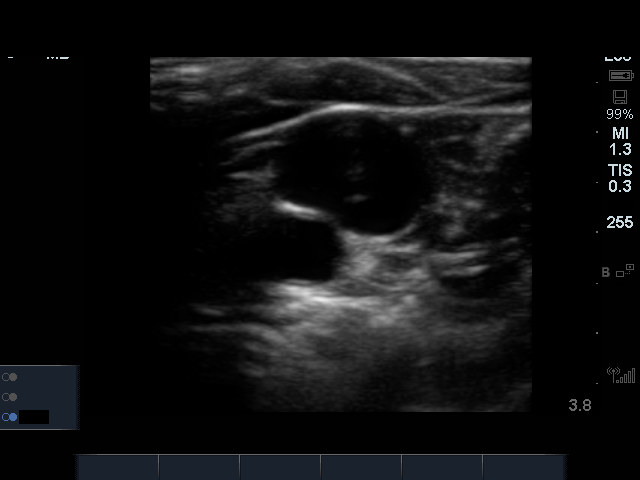
[im 2/2]
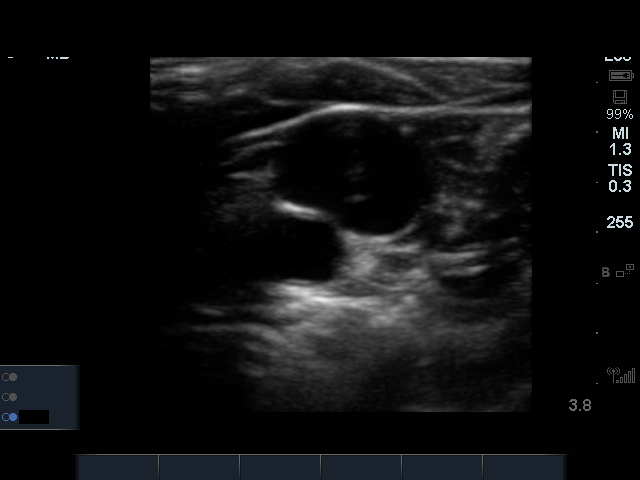

[2 of 2 positions shown; findings below may reference images not displayed]

EXAM:
TUNNELED RIGHT JUGULAR PICC LINE PLACEMENT WITH ULTRASOUND AND
FLUOROSCOPIC GUIDANCE

MEDICATIONS:
None

ANESTHESIA/SEDATION:
Versed 1 mg IV; Fentanyl 50 mcg IV;

Moderate Sedation Time:  10

The patient was continuously monitored during the procedure by the
interventional radiology nurse under my direct supervision.

FLUOROSCOPY TIME:  Fluoroscopy Time:  minutes 42 seconds (2.9 mGy).

COMPLICATIONS:
None immediate.

PROCEDURE:
The patient was advised of the possible risks and complications and
agreed to undergo the procedure. The patient was then brought to the
angiographic suite for the procedure.

The right neck was prepped with chlorhexidine, draped in the usual
sterile fashion using maximum barrier technique (cap and mask,
sterile gown, sterile gloves, large sterile sheet, hand hygiene and
cutaneous antiseptic). Local anesthesia was attained by infiltration
with 1% lidocaine.

Ultrasound demonstrated patency of the right jugular vein, and this
was documented with an image. Under real-time ultrasound guidance,
this vein was accessed with a 21 gauge micropuncture needle and
image documentation was performed. The needle was exchanged over a
guidewire for a peel-away sheath through which a 20 cm 5 French
single lumen power injectable PICC was advanced, and positioned with
its tip at the lower SVC/right atrial junction. The cuff was
positioned in the subcutaneous tract. Fluoroscopy during the
procedure and fluoro spot radiograph confirms appropriate catheter
position. The catheter was flushed, secured to the skin with Prolene
sutures, and covered with a sterile dressing.
IMPRESSION: Successful placement of a tunneled right jugular PICC with
sonographic and fluoroscopic guidance. The catheter is ready for
use.

## 2018-05-26 IMAGING — DX DG CHEST 1V
1 series · 1 of 1 positions shown · non-contrast
Comparison: 12/15/2016

CLINICAL DATA: Soft tissue swelling chest wall. Sickle cell disease

EXAM:
CHEST 1 VIEW

[chest ap]
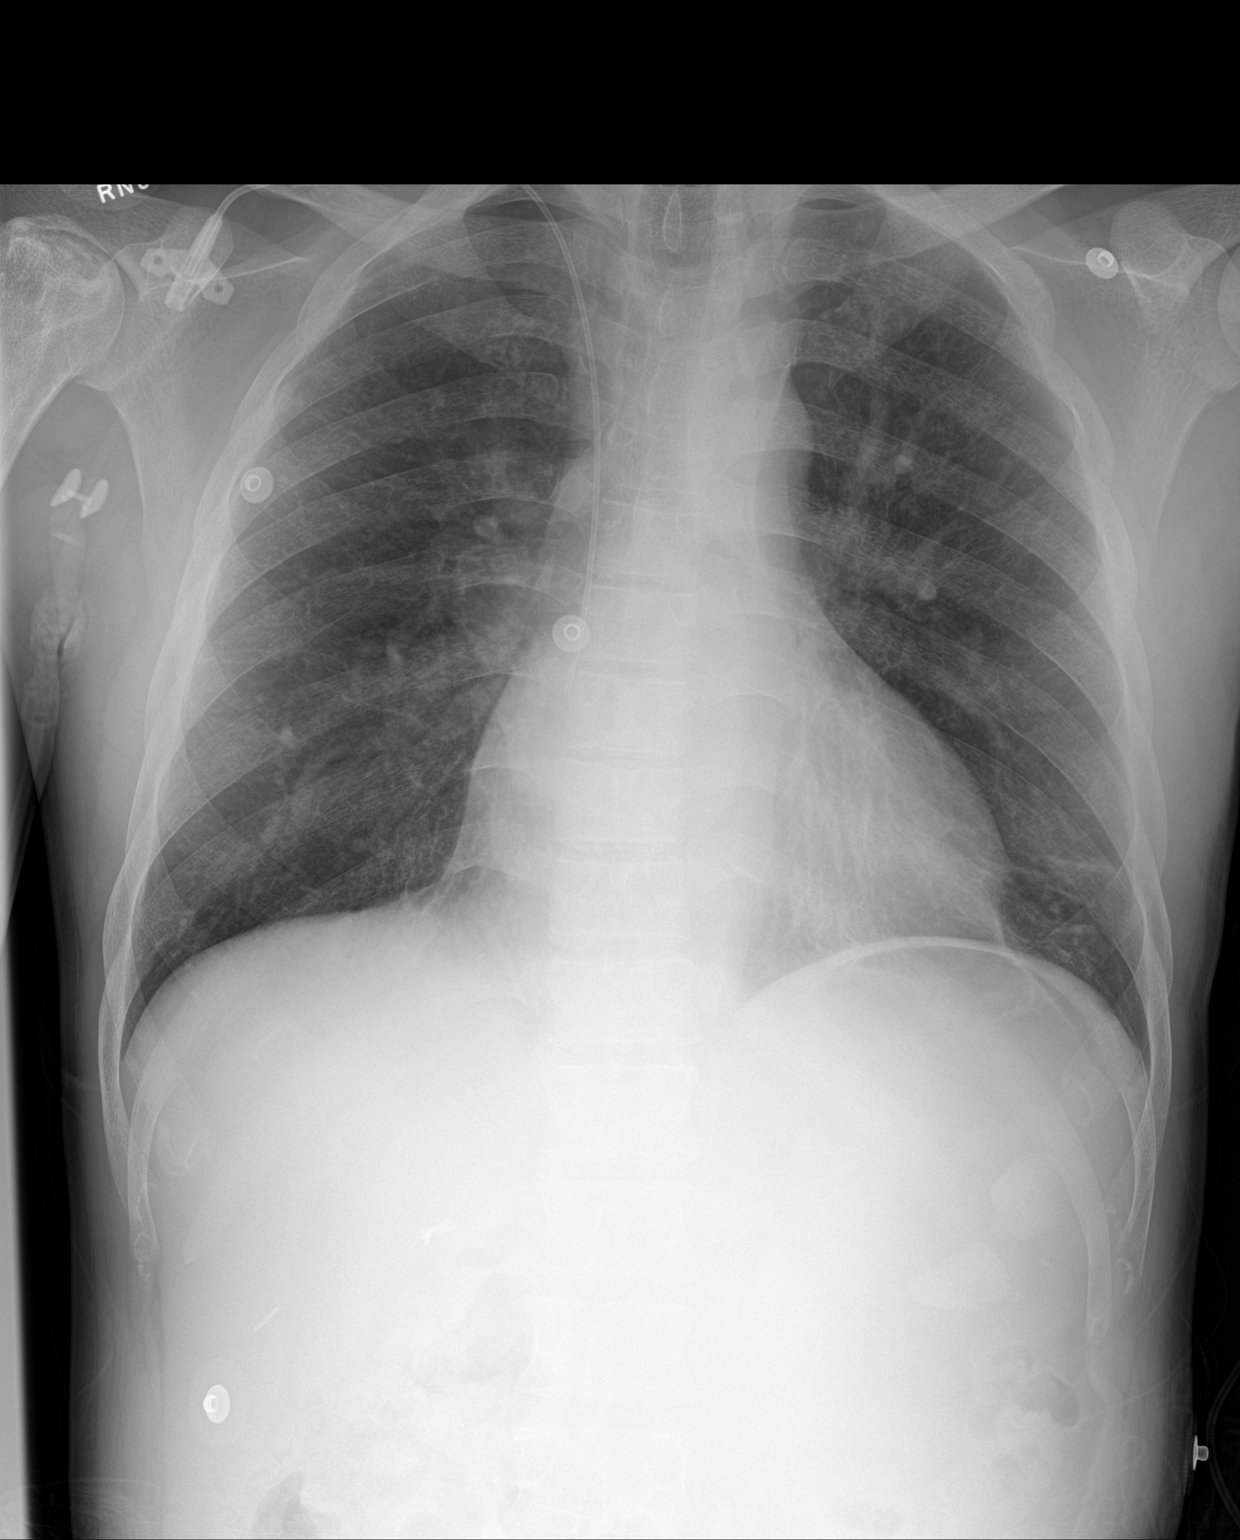

[1 of 1 positions shown; findings below may reference images not displayed]

FINDINGS: Heart size within normal limits. Vascularity normal. Central venous
catheter tip in the SVC/RA junction. No pneumothorax. No pleural
effusion or mass.

Patchy airspace disease in the left lung base is new since the prior
study.

Changes in the right humeral head, possible AVN
IMPRESSION: Left lower lobe airspace disease has developed in the interval. This
may represent atelectasis or pneumonia

Central venous catheter tip at the cavoatrial junction.

## 2018-06-04 DIAGNOSIS — N183 Chronic kidney disease, stage 3 (moderate): Secondary | ICD-10-CM | POA: Diagnosis not present

## 2018-06-04 DIAGNOSIS — Z79899 Other long term (current) drug therapy: Secondary | ICD-10-CM | POA: Diagnosis not present

## 2018-06-09 ENCOUNTER — Other Ambulatory Visit: Payer: Self-pay | Admitting: Family Medicine

## 2018-06-09 ENCOUNTER — Emergency Department (HOSPITAL_COMMUNITY)
Admission: EM | Admit: 2018-06-09 | Discharge: 2018-06-09 | Disposition: A | Discharge status: Home / Self Care | Payer: Medicare Other | Attending: Emergency Medicine | Admitting: Emergency Medicine

## 2018-06-09 ENCOUNTER — Other Ambulatory Visit: Payer: Self-pay

## 2018-06-09 ENCOUNTER — Encounter (HOSPITAL_COMMUNITY): Payer: Self-pay

## 2018-06-09 DIAGNOSIS — R7989 Other specified abnormal findings of blood chemistry: Secondary | ICD-10-CM | POA: Diagnosis not present

## 2018-06-09 DIAGNOSIS — D5700 Hb-SS disease with crisis, unspecified: Secondary | ICD-10-CM | POA: Diagnosis not present

## 2018-06-09 DIAGNOSIS — Z79899 Other long term (current) drug therapy: Secondary | ICD-10-CM | POA: Insufficient documentation

## 2018-06-09 DIAGNOSIS — Z720 Tobacco use: Secondary | ICD-10-CM

## 2018-06-09 DIAGNOSIS — F17210 Nicotine dependence, cigarettes, uncomplicated: Secondary | ICD-10-CM | POA: Diagnosis not present

## 2018-06-09 DIAGNOSIS — D638 Anemia in other chronic diseases classified elsewhere: Secondary | ICD-10-CM | POA: Insufficient documentation

## 2018-06-09 DIAGNOSIS — D649 Anemia, unspecified: Secondary | ICD-10-CM | POA: Diagnosis not present

## 2018-06-09 DIAGNOSIS — R740 Nonspecific elevation of levels of transaminase and lactic acid dehydrogenase [LDH]: Secondary | ICD-10-CM | POA: Diagnosis not present

## 2018-06-09 DIAGNOSIS — R7401 Elevation of levels of liver transaminase levels: Secondary | ICD-10-CM

## 2018-06-09 DIAGNOSIS — R000 Tachycardia, unspecified: Secondary | ICD-10-CM | POA: Diagnosis not present

## 2018-06-09 DIAGNOSIS — N183 Chronic kidney disease, stage 3 (moderate): Secondary | ICD-10-CM | POA: Diagnosis not present

## 2018-06-09 LAB — COMPREHENSIVE METABOLIC PANEL
ALT: 56 U/L — ABNORMAL HIGH (ref 0–44)
AST: 71 U/L — ABNORMAL HIGH (ref 15–41)
Albumin: 2.6 g/dL — ABNORMAL LOW (ref 3.5–5.0)
Alkaline Phosphatase: 268 U/L — ABNORMAL HIGH (ref 38–126)
Anion gap: 7 (ref 5–15)
BUN: 29 mg/dL — ABNORMAL HIGH (ref 6–20)
CO2: 24 mmol/L (ref 22–32)
Calcium: 7.3 mg/dL — ABNORMAL LOW (ref 8.9–10.3)
Chloride: 107 mmol/L (ref 98–111)
Creatinine, Ser: 2.75 mg/dL — ABNORMAL HIGH (ref 0.61–1.24)
GFR calc Af Amer: 33 mL/min — ABNORMAL LOW (ref >60)
GFR calc non Af Amer: 29 mL/min — ABNORMAL LOW (ref >60)
Glucose, Bld: 102 mg/dL — ABNORMAL HIGH (ref 70–99)
Potassium: 3.9 mmol/L (ref 3.5–5.1)
Sodium: 138 mmol/L (ref 135–145)
Total Bilirubin: 2.1 mg/dL — ABNORMAL HIGH (ref 0.3–1.2)
Total Protein: 5.9 g/dL — ABNORMAL LOW (ref 6.5–8.1)

## 2018-06-09 LAB — CBC WITH DIFFERENTIAL/PLATELET
Abs Immature Granulocytes: 0.04 10*3/uL (ref 0.00–0.07)
Basophils Absolute: 0 10*3/uL (ref 0.0–0.1)
Basophils Relative: 0 %
Eosinophils Absolute: 0 10*3/uL (ref 0.0–0.5)
Eosinophils Relative: 0 %
HCT: 20.9 % — ABNORMAL LOW (ref 39.0–52.0)
Hemoglobin: 7 g/dL — ABNORMAL LOW (ref 13.0–17.0)
Immature Granulocytes: 0 %
Lymphocytes Relative: 44 %
Lymphs Abs: 5 10*3/uL — ABNORMAL HIGH (ref 0.7–4.0)
MCH: 37.6 pg — ABNORMAL HIGH (ref 26.0–34.0)
MCHC: 33.5 g/dL (ref 30.0–36.0)
MCV: 112.4 fL — ABNORMAL HIGH (ref 80.0–100.0)
Monocytes Absolute: 0.3 10*3/uL (ref 0.1–1.0)
Monocytes Relative: 3 %
Neutro Abs: 5.9 10*3/uL (ref 1.7–7.7)
Neutrophils Relative %: 53 %
Platelets: 244 10*3/uL (ref 150–400)
RBC: 1.86 MIL/uL — ABNORMAL LOW (ref 4.22–5.81)
WBC: 11.2 10*3/uL — ABNORMAL HIGH (ref 4.0–10.5)
nRBC: 0.4 % — ABNORMAL HIGH (ref 0.0–0.2)

## 2018-06-09 LAB — RETICULOCYTES
Immature Retic Fract: 30.6 % — ABNORMAL HIGH (ref 2.3–15.9)
RBC.: 1.86 MIL/uL — ABNORMAL LOW (ref 4.22–5.81)
Retic Count, Absolute: 101.2 10*3/uL (ref 19.0–186.0)
Retic Ct Pct: 5.4 % — ABNORMAL HIGH (ref 0.4–3.1)

## 2018-06-09 MED ORDER — HYDROMORPHONE HCL 2 MG/ML IJ SOLN: 2.0000 mg | INTRAMUSCULAR | Status: AC

## 2018-06-09 MED ORDER — DEXTROSE-NACL 5-0.45 % IV SOLN
INTRAVENOUS | Status: DC
  Administered 2018-06-09: 19:00:00 via INTRAVENOUS

## 2018-06-09 MED ORDER — HYDROMORPHONE HCL 2 MG/ML IJ SOLN
2.0000 mg | INTRAMUSCULAR | Status: AC
  Administered 2018-06-09: 20:00:00 2 mg via INTRAVENOUS
  Filled 2018-06-09: qty 1

## 2018-06-09 MED ORDER — DIPHENHYDRAMINE HCL 25 MG PO CAPS: 25.0000 mg | ORAL_CAPSULE | ORAL | Status: DC | PRN

## 2018-06-09 MED ORDER — SODIUM CHLORIDE 0.9% FLUSH
3.0000 mL | Freq: Once | INTRAVENOUS | Status: AC
  Administered 2018-06-09: 3 mL via INTRAVENOUS

## 2018-06-09 MED ORDER — HYDROMORPHONE HCL 2 MG/ML IJ SOLN
2.0000 mg | INTRAMUSCULAR | Status: AC
  Administered 2018-06-09: 21:00:00 2 mg via INTRAVENOUS
  Filled 2018-06-09: qty 1

## 2018-06-09 MED ORDER — HYDROMORPHONE HCL 2 MG/ML IJ SOLN
2.0000 mg | INTRAMUSCULAR | Status: AC
  Administered 2018-06-09: 19:00:00 2 mg via INTRAVENOUS
  Filled 2018-06-09: qty 1

## 2018-06-09 NOTE — ED Triage Notes (Signed)
States sickle cell crisis pain since last night severe pain in arms/legs like typical pain he has with sickle cell.

## 2018-06-09 NOTE — Discharge Instructions (Signed)
Your labs are reassuring today. Use your home pain medications as needed for pain, but don't drive or operate machinery while taking narcotics; use tylenol for additional pain relief. Stay well hydrated and get plenty of rest. Stay warm. STOP SMOKING! Follow up with your regular doctor in 2-3 days for recheck of symptoms and ongoing management of your sickle cell disease. Return to the ER for emergent changes or worsening symptoms.
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
## 2018-06-19 ENCOUNTER — Other Ambulatory Visit: Payer: Self-pay | Admitting: Family Medicine

## 2018-06-19 DIAGNOSIS — D571 Sickle-cell disease without crisis: Secondary | ICD-10-CM

## 2018-06-20 DIAGNOSIS — D571 Sickle-cell disease without crisis: Secondary | ICD-10-CM | POA: Diagnosis not present

## 2018-06-20 DIAGNOSIS — R7989 Other specified abnormal findings of blood chemistry: Secondary | ICD-10-CM | POA: Diagnosis not present

## 2018-06-20 DIAGNOSIS — E538 Deficiency of other specified B group vitamins: Secondary | ICD-10-CM | POA: Diagnosis not present

## 2018-06-20 NOTE — Telephone Encounter (Signed)
Message sent to provider 

## 2018-06-21 DIAGNOSIS — D571 Sickle-cell disease without crisis: Secondary | ICD-10-CM | POA: Diagnosis not present

## 2018-06-24 DIAGNOSIS — Z720 Tobacco use: Secondary | ICD-10-CM | POA: Diagnosis not present

## 2018-06-24 DIAGNOSIS — N051 Unspecified nephritic syndrome with focal and segmental glomerular lesions: Secondary | ICD-10-CM | POA: Diagnosis not present

## 2018-06-24 DIAGNOSIS — N183 Chronic kidney disease, stage 3 (moderate): Secondary | ICD-10-CM | POA: Diagnosis not present

## 2018-06-24 DIAGNOSIS — R809 Proteinuria, unspecified: Secondary | ICD-10-CM | POA: Diagnosis not present

## 2018-06-24 DIAGNOSIS — D5700 Hb-SS disease with crisis, unspecified: Secondary | ICD-10-CM | POA: Diagnosis not present

## 2018-07-03 ENCOUNTER — Emergency Department (HOSPITAL_COMMUNITY): Payer: Medicare Other

## 2018-07-03 ENCOUNTER — Inpatient Hospital Stay (HOSPITAL_COMMUNITY)
Admission: EM | Admit: 2018-07-03 | Discharge: 2018-07-06 | DRG: 812 | Disposition: A | Discharge status: Home / Self Care | Payer: Medicare Other | Attending: Internal Medicine | Admitting: Internal Medicine

## 2018-07-03 ENCOUNTER — Other Ambulatory Visit: Payer: Self-pay

## 2018-07-03 ENCOUNTER — Encounter (HOSPITAL_COMMUNITY): Payer: Self-pay | Admitting: Family Medicine

## 2018-07-03 DIAGNOSIS — F17210 Nicotine dependence, cigarettes, uncomplicated: Secondary | ICD-10-CM | POA: Diagnosis present

## 2018-07-03 DIAGNOSIS — Z885 Allergy status to narcotic agent status: Secondary | ICD-10-CM

## 2018-07-03 DIAGNOSIS — Z79899 Other long term (current) drug therapy: Secondary | ICD-10-CM | POA: Diagnosis not present

## 2018-07-03 DIAGNOSIS — Z03818 Encounter for observation for suspected exposure to other biological agents ruled out: Secondary | ICD-10-CM | POA: Diagnosis not present

## 2018-07-03 DIAGNOSIS — N049 Nephrotic syndrome with unspecified morphologic changes: Secondary | ICD-10-CM | POA: Diagnosis not present

## 2018-07-03 DIAGNOSIS — N183 Chronic kidney disease, stage 3 unspecified: Secondary | ICD-10-CM | POA: Diagnosis present

## 2018-07-03 DIAGNOSIS — E875 Hyperkalemia: Secondary | ICD-10-CM | POA: Diagnosis not present

## 2018-07-03 DIAGNOSIS — Z886 Allergy status to analgesic agent status: Secondary | ICD-10-CM

## 2018-07-03 DIAGNOSIS — R000 Tachycardia, unspecified: Secondary | ICD-10-CM | POA: Diagnosis not present

## 2018-07-03 DIAGNOSIS — N08 Glomerular disorders in diseases classified elsewhere: Secondary | ICD-10-CM | POA: Diagnosis present

## 2018-07-03 DIAGNOSIS — Z9081 Acquired absence of spleen: Secondary | ICD-10-CM | POA: Diagnosis not present

## 2018-07-03 DIAGNOSIS — F1410 Cocaine abuse, uncomplicated: Secondary | ICD-10-CM | POA: Diagnosis present

## 2018-07-03 DIAGNOSIS — D5700 Hb-SS disease with crisis, unspecified: Principal | ICD-10-CM | POA: Diagnosis present

## 2018-07-03 DIAGNOSIS — R079 Chest pain, unspecified: Secondary | ICD-10-CM | POA: Diagnosis not present

## 2018-07-03 DIAGNOSIS — N179 Acute kidney failure, unspecified: Secondary | ICD-10-CM | POA: Diagnosis present

## 2018-07-03 DIAGNOSIS — D631 Anemia in chronic kidney disease: Secondary | ICD-10-CM | POA: Diagnosis present

## 2018-07-03 DIAGNOSIS — M87821 Other osteonecrosis, right humerus: Secondary | ICD-10-CM | POA: Diagnosis present

## 2018-07-03 DIAGNOSIS — Z20828 Contact with and (suspected) exposure to other viral communicable diseases: Secondary | ICD-10-CM | POA: Diagnosis present

## 2018-07-03 DIAGNOSIS — G894 Chronic pain syndrome: Secondary | ICD-10-CM | POA: Diagnosis present

## 2018-07-03 DIAGNOSIS — Z9049 Acquired absence of other specified parts of digestive tract: Secondary | ICD-10-CM

## 2018-07-03 DIAGNOSIS — M79672 Pain in left foot: Secondary | ICD-10-CM | POA: Diagnosis present

## 2018-07-03 LAB — URINALYSIS, ROUTINE W REFLEX MICROSCOPIC
Bacteria, UA: NONE SEEN
Bilirubin Urine: NEGATIVE
Glucose, UA: 50 mg/dL — AB
Hgb urine dipstick: NEGATIVE
Ketones, ur: NEGATIVE mg/dL
Leukocytes,Ua: NEGATIVE
Nitrite: NEGATIVE
Protein, ur: 100 mg/dL — AB
Specific Gravity, Urine: 1.009 (ref 1.005–1.030)
pH: 7 (ref 5.0–8.0)

## 2018-07-03 LAB — CBC WITH DIFFERENTIAL/PLATELET
Abs Immature Granulocytes: 0.05 10*3/uL (ref 0.00–0.07)
Basophils Absolute: 0 10*3/uL (ref 0.0–0.1)
Basophils Relative: 0 %
Eosinophils Absolute: 0 10*3/uL (ref 0.0–0.5)
Eosinophils Relative: 0 %
HCT: 16.9 % — ABNORMAL LOW (ref 39.0–52.0)
Hemoglobin: 5.8 g/dL — CL (ref 13.0–17.0)
Immature Granulocytes: 1 %
Lymphocytes Relative: 23 %
Lymphs Abs: 2.1 10*3/uL (ref 0.7–4.0)
MCH: 44.3 pg — ABNORMAL HIGH (ref 26.0–34.0)
MCHC: 34.3 g/dL (ref 30.0–36.0)
MCV: 129 fL — ABNORMAL HIGH (ref 80.0–100.0)
Monocytes Absolute: 0.4 10*3/uL (ref 0.1–1.0)
Monocytes Relative: 5 %
Neutro Abs: 6.5 10*3/uL (ref 1.7–7.7)
Neutrophils Relative %: 71 %
Platelets: 204 10*3/uL (ref 150–400)
RBC: 1.31 MIL/uL — ABNORMAL LOW (ref 4.22–5.81)
WBC: 9.1 10*3/uL (ref 4.0–10.5)
nRBC: 18 % — ABNORMAL HIGH (ref 0.0–0.2)

## 2018-07-03 LAB — COMPREHENSIVE METABOLIC PANEL
ALT: 38 U/L (ref 0–44)
AST: 29 U/L (ref 15–41)
Albumin: 2.4 g/dL — ABNORMAL LOW (ref 3.5–5.0)
Alkaline Phosphatase: 178 U/L — ABNORMAL HIGH (ref 38–126)
Anion gap: 4 — ABNORMAL LOW (ref 5–15)
BUN: 52 mg/dL — ABNORMAL HIGH (ref 6–20)
CO2: 19 mmol/L — ABNORMAL LOW (ref 22–32)
Calcium: 7.5 mg/dL — ABNORMAL LOW (ref 8.9–10.3)
Chloride: 115 mmol/L — ABNORMAL HIGH (ref 98–111)
Creatinine, Ser: 3 mg/dL — ABNORMAL HIGH (ref 0.61–1.24)
GFR calc Af Amer: 30 mL/min — ABNORMAL LOW (ref >60)
GFR calc non Af Amer: 26 mL/min — ABNORMAL LOW (ref >60)
Glucose, Bld: 118 mg/dL — ABNORMAL HIGH (ref 70–99)
Potassium: 6.1 mmol/L — ABNORMAL HIGH (ref 3.5–5.1)
Sodium: 138 mmol/L (ref 135–145)
Total Bilirubin: 1.5 mg/dL — ABNORMAL HIGH (ref 0.3–1.2)
Total Protein: 5 g/dL — ABNORMAL LOW (ref 6.5–8.1)

## 2018-07-03 LAB — POTASSIUM: Potassium: 6.3 mmol/L (ref 3.5–5.1)

## 2018-07-03 LAB — RETICULOCYTES
Immature Retic Fract: 45.5 % — ABNORMAL HIGH (ref 2.3–15.9)
RBC.: 1.31 MIL/uL — ABNORMAL LOW (ref 4.22–5.81)
Retic Count, Absolute: 96.4 10*3/uL (ref 19.0–186.0)
Retic Ct Pct: 7.4 % — ABNORMAL HIGH (ref 0.4–3.1)

## 2018-07-03 LAB — PREPARE RBC (CROSSMATCH)

## 2018-07-03 LAB — SARS CORONAVIRUS 2 BY RT PCR (HOSPITAL ORDER, PERFORMED IN ~~LOC~~ HOSPITAL LAB): SARS Coronavirus 2: NEGATIVE

## 2018-07-03 LAB — LACTATE DEHYDROGENASE: LDH: 181 U/L (ref 98–192)

## 2018-07-03 MED ORDER — PREDNISONE 20 MG PO TABS
60.0000 mg | ORAL_TABLET | Freq: Every day | ORAL | Status: DC
  Administered 2018-07-04 – 2018-07-06 (×3): 60 mg via ORAL
  Filled 2018-07-03 (×3): qty 3

## 2018-07-03 MED ORDER — VITAMIN B-12 1000 MCG PO TABS
2000.0000 ug | ORAL_TABLET | Freq: Every day | ORAL | Status: DC
  Administered 2018-07-04 – 2018-07-06 (×3): 2000 ug via ORAL
  Filled 2018-07-03 (×3): qty 2

## 2018-07-03 MED ORDER — SODIUM ZIRCONIUM CYCLOSILICATE 10 G PO PACK
10.0000 g | PACK | Freq: Three times a day (TID) | ORAL | Status: AC
  Administered 2018-07-03: 21:00:00 10 g via ORAL
  Filled 2018-07-03 (×3): qty 1

## 2018-07-03 MED ORDER — HYDROMORPHONE HCL 1 MG/ML IJ SOLN: 1.0000 mg | INTRAMUSCULAR | Status: DC

## 2018-07-03 MED ORDER — INSULIN ASPART 100 UNIT/ML IV SOLN
5.0000 [IU] | Freq: Once | INTRAVENOUS | Status: AC
  Administered 2018-07-03: 21:00:00 5 [IU] via INTRAVENOUS
  Filled 2018-07-03: qty 0.05

## 2018-07-03 MED ORDER — CALCIUM GLUCONATE 10 % IV SOLN
1.0000 g | Freq: Once | INTRAVENOUS | Status: AC
  Administered 2018-07-03: 21:00:00 1 g via INTRAVENOUS
  Filled 2018-07-03: qty 10

## 2018-07-03 MED ORDER — NALOXONE HCL 0.4 MG/ML IJ SOLN
0.4000 mg | INTRAMUSCULAR | Status: DC | PRN
  Filled 2018-07-03: qty 1

## 2018-07-03 MED ORDER — SENNOSIDES-DOCUSATE SODIUM 8.6-50 MG PO TABS
1.0000 | ORAL_TABLET | Freq: Two times a day (BID) | ORAL | Status: DC
  Filled 2018-07-03 (×5): qty 1

## 2018-07-03 MED ORDER — HYDROMORPHONE HCL 1 MG/ML IJ SOLN
0.5000 mg | INTRAMUSCULAR | Status: AC
  Administered 2018-07-03: 18:00:00 0.5 mg via INTRAVENOUS
  Filled 2018-07-03: qty 1

## 2018-07-03 MED ORDER — HYDROMORPHONE HCL 1 MG/ML IJ SOLN
1.0000 mg | INTRAMUSCULAR | Status: AC
  Administered 2018-07-03: 19:00:00 1 mg via INTRAVENOUS

## 2018-07-03 MED ORDER — DIPHENHYDRAMINE HCL 50 MG/ML IJ SOLN
25.0000 mg | Freq: Once | INTRAMUSCULAR | Status: AC
  Administered 2018-07-03: 18:00:00 25 mg via INTRAVENOUS
  Filled 2018-07-03: qty 1

## 2018-07-03 MED ORDER — DEXTROSE 50 % IV SOLN
1.0000 | Freq: Once | INTRAVENOUS | Status: AC
  Administered 2018-07-03: 21:00:00 50 mL via INTRAVENOUS
  Filled 2018-07-03: qty 50

## 2018-07-03 MED ORDER — POLYETHYLENE GLYCOL 3350 17 G PO PACK: 17.0000 g | PACK | Freq: Every day | ORAL | Status: DC | PRN

## 2018-07-03 MED ORDER — SODIUM CHLORIDE 0.9% FLUSH: 9.0000 mL | INTRAVENOUS | Status: DC | PRN

## 2018-07-03 MED ORDER — HYDROMORPHONE HCL 1 MG/ML IJ SOLN: 0.5000 mg | INTRAMUSCULAR | Status: AC

## 2018-07-03 MED ORDER — HYDROMORPHONE 1 MG/ML IV SOLN
INTRAVENOUS | Status: DC
  Administered 2018-07-03: 30 mg via INTRAVENOUS
  Administered 2018-07-04: 0.8 mg via INTRAVENOUS
  Filled 2018-07-03: qty 30

## 2018-07-03 MED ORDER — SODIUM BICARBONATE 8.4 % IV SOLN
50.0000 meq | Freq: Once | INTRAVENOUS | Status: AC
  Administered 2018-07-03: 21:00:00 50 meq via INTRAVENOUS
  Filled 2018-07-03: qty 50

## 2018-07-03 MED ORDER — HYDROMORPHONE HCL 1 MG/ML IJ SOLN: 1.0000 mg | INTRAMUSCULAR | Status: AC

## 2018-07-03 MED ORDER — SODIUM CHLORIDE 0.45 % IV SOLN
INTRAVENOUS | Status: DC
  Administered 2018-07-03: 18:00:00 via INTRAVENOUS

## 2018-07-03 MED ORDER — HYDROMORPHONE HCL 1 MG/ML IJ SOLN
1.0000 mg | INTRAMUSCULAR | Status: DC
  Filled 2018-07-03: qty 1

## 2018-07-03 MED ORDER — FOLIC ACID 1 MG PO TABS
1.0000 mg | ORAL_TABLET | Freq: Every day | ORAL | Status: DC
  Administered 2018-07-04 – 2018-07-06 (×3): 1 mg via ORAL
  Filled 2018-07-03 (×3): qty 1

## 2018-07-03 MED ORDER — HYDROMORPHONE HCL 1 MG/ML IJ SOLN
1.0000 mg | INTRAMUSCULAR | Status: AC
  Filled 2018-07-03: qty 1

## 2018-07-03 MED ORDER — HYDROMORPHONE HCL 1 MG/ML IJ SOLN
1.0000 mg | INTRAMUSCULAR | Status: AC
  Administered 2018-07-03 (×2): 1 mg via INTRAVENOUS
  Filled 2018-07-03: qty 1

## 2018-07-03 MED ORDER — VITAMIN D 25 MCG (1000 UNIT) PO TABS
2000.0000 [IU] | ORAL_TABLET | Freq: Two times a day (BID) | ORAL | Status: DC
  Administered 2018-07-04 – 2018-07-06 (×5): 2000 [IU] via ORAL
  Filled 2018-07-03 (×5): qty 2

## 2018-07-03 MED ORDER — GABAPENTIN 300 MG PO CAPS
300.0000 mg | ORAL_CAPSULE | Freq: Every day | ORAL | Status: DC
  Administered 2018-07-03 – 2018-07-05 (×3): 300 mg via ORAL
  Filled 2018-07-03 (×3): qty 1

## 2018-07-03 MED ORDER — HEPARIN SODIUM (PORCINE) 5000 UNIT/ML IJ SOLN
5000.0000 [IU] | Freq: Three times a day (TID) | INTRAMUSCULAR | Status: DC
  Administered 2018-07-03 – 2018-07-06 (×9): 5000 [IU] via SUBCUTANEOUS
  Filled 2018-07-03 (×9): qty 1

## 2018-07-03 NOTE — H&P (Addendum)
History and Physical    Emir A [employer]. AUQ:[id number] DOB: November 23, 1980 DOA: 07/03/2018  PCP: Lanae Boast, FNP  Patient coming from: Home.  Chief Complaint: Extremity pain.  HPI: [name] [name]. is a 38 y.o. male with history of sickle cell anemia, chronic kidney disease/nephrotic syndrome, anemia of polysubstance abuse presents to the ER with increasing pain over the last 2 days.  Denies any chest pain shortness of breath productive cough fever chills headache or any visual symptoms.  ED Course: In the ER patient was afebrile chest x-ray shows chronic infiltrates code 19 test was negative.  Labs show hemoglobin of 5.8 which is decreased from his baseline.  Potassium was 6.1 creatinine is 3 total bilirubin 1.5 platelets 204.  EKG shows sinus tachycardia with no definite signs of any changes for hyperkalemia.  For the hyperkalemia patient was given calcium gluconate D50 insulin and Lokelma.  For his sickle cell pain crisis started on Dilaudid infusion and PRBC transfusion ordered.  Review of Systems: As per HPI, rest all negative.   Past Medical History:  Diagnosis Date  . Arachnoid Cyst of brain bilaterally    "2 really small ones in the back of my head; inside; saw them w/MRI" (09/25/2012)  . Bacterial pneumonia ~ 2012   "caught it here in the hospital" (09/25/2012)  . Chronic kidney disease    "from my sickle cell" (09/25/2012)  . CKD (chronic kidney disease), stage II   . GERD (gastroesophageal reflux disease)    "after I eat alot of spicey foods" (09/25/2012)  . Gynecomastia, male 07/10/2012  . History of blood transfusion    "always related to sickle cell crisis" (09/25/2012)  . 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   Noted in noted from [facility] at [facility]  . Migraines    "take RX qd to prevent them" (09/25/2012)  . Sickle cell anemia (HCC)   . Sickle cell crisis ([place]) 09/25/2012  . Sickle cell nephropathy ([place]) 07/10/2012  . Sinus  tachycardia   . Tachycardia with heart rate 121-140 beats per minute with ambulation 08/04/2016    Past Surgical History:  Procedure Laterality Date  . CHOLECYSTECTOMY  ~ 2012  . COLONOSCOPY N/A 04/23/2017   Procedure: COLONOSCOPY;  Surgeon: Irene Shipper, MD;  Location: [facility];  Service: Endoscopy;  Laterality: N/A;  . IR FLUORO GUIDE CV LINE RIGHT  12/17/2016  . IR REMOVAL TUN CV CATH W/O FL  12/21/2016  . IR US GUIDE VASC ACCESS RIGHT  12/17/2016  . spleenectomy       reports that he has been smoking cigarettes. He has been smoking about 0.10 packs per day. He has never used smokeless tobacco. He reports current alcohol use. He reports previous drug use. Drug: Marijuana.  Allergies  Allergen Reactions  . Nsaids Other (See Comments)    Kidney disease  . Morphine And Related Other (See Comments)    "real bad headaches"    Family History  Problem Relation Age of Onset  . Breast cancer Mother     Prior to Admission medications   Medication Sig Start Date End Date Taking? Authorizing Provider  acetaminophen (TYLENOL) 500 MG tablet Take 1,000 mg by mouth every 6 (six) hours as needed for moderate pain.   Yes [provider]  Cholecalciferol (VITAMIN D) 50 MCG (2000 UT) tablet Take 2,000 Units by mouth 2 (two) times a day. 06/21/18  Yes [provider]  [facility] VITAMIN B12 1000 MCG tablet Take 2,000 mcg by  mouth daily. 06/21/18  Yes [provider]  folic acid (FOLVITE) 1 MG tablet Take 1 tablet (1 mg total) daily by mouth. 12/22/16  Yes Leana Gamer, MD  gabapentin (NEURONTIN) 300 MG capsule Take 300 mg by mouth at bedtime. 06/21/18  Yes [provider]  hydroxyurea (HYDREA) 500 MG capsule TAKE 3 CAPSULES BY MOUTH DAILY Patient taking differently: Take 1,500 mg by mouth daily.  06/19/18  Yes Lanae Boast, FNP  nortriptyline (PAMELOR) 25 MG capsule Take 1 capsule (25 mg total) by mouth at bedtime as needed for sleep. 06/10/18  Yes Donnamae Jude, MD   predniSONE (DELTASONE) 20 MG tablet Take 60 mg by mouth daily. 06/26/18  Yes [provider]  ergocalciferol (DRISDOL) 1.25 MG (50000 UT) capsule Take 1 capsule (50,000 Units total) by mouth once a week. Patient not taking: Reported on 05/05/2018 04/05/18   Lanae Boast, FNP    Physical Exam: Vitals:   07/03/18 2015 07/03/18 2030 07/03/18 2045 07/03/18 2100  BP: (!) 130/91 117/89 124/79 122/83  Pulse: 99 97 100 96  Resp: 10 12 10 15   Temp:      TempSrc:      SpO2: 98% 95% 98% 98%  Weight:      Height:          Constitutional: Moderately built and nourished. Vitals:   07/03/18 2015 07/03/18 2030 07/03/18 2045 07/03/18 2100  BP: (!) 130/91 117/89 124/79 122/83  Pulse: 99 97 100 96  Resp: 10 12 10 15   Temp:      TempSrc:      SpO2: 98% 95% 98% 98%  Weight:      Height:       Eyes: Anicteric no pallor. ENMT: No discharge from the ears eyes nose and mouth. Neck: No mass felt.  No neck rigidity. Respiratory: No rhonchi or crepitations. Cardiovascular: S1-S2 heard. Abdomen: Soft nontender bowel sounds present. Musculoskeletal: No edema.  No joint effusion. Skin: No rash. Neurologic: Patient is mildly lethargic arousable answers questions moves all extremities. Psychiatric: Mildly lethargic.   Labs on Admission: I have personally reviewed following labs and imaging studies  CBC: Recent Labs  Lab 07/03/18 1629  WBC 9.1  NEUTROABS 6.5  HGB 5.8*  HCT 16.9*  MCV 129.0*  PLT 580   Basic Metabolic Panel: Recent Labs  Lab 07/03/18 1629 07/03/18 1936  NA 138  --   K 6.1* 6.3*  CL 115*  --   CO2 19*  --   GLUCOSE 118*  --   BUN 52*  --   CREATININE 3.00*  --   CALCIUM 7.5*  --    GFR: Estimated Creatinine Clearance: 27.7 mL/min (A) (by C-G formula based on SCr of 3 mg/dL (H)). Liver Function Tests: Recent Labs  Lab 07/03/18 1629  AST 29  ALT 38  ALKPHOS 178*  BILITOT 1.5*  PROT 5.0*  ALBUMIN 2.4*   No results for input(s): LIPASE, AMYLASE  in the last 168 hours. No results for input(s): AMMONIA in the last 168 hours. Coagulation Profile: No results for input(s): INR, PROTIME in the last 168 hours. Cardiac Enzymes: No results for input(s): CKTOTAL, CKMB, CKMBINDEX, TROPONINI in the last 168 hours. BNP (last 3 results) No results for input(s): PROBNP in the last 8760 hours. HbA1C: No results for input(s): HGBA1C in the last 72 hours. CBG: No results for input(s): GLUCAP in the last 168 hours. Lipid Profile: No results for input(s): CHOL, HDL, LDLCALC, TRIG, CHOLHDL, LDLDIRECT in the last  72 hours. Thyroid Function Tests: No results for input(s): TSH, T4TOTAL, FREET4, T3FREE, THYROIDAB in the last 72 hours. Anemia Panel: Recent Labs    07/03/18 1629  RETICCTPCT 7.4*   Urine analysis:    Component Value Date/Time   COLORURINE  05/19/2018 1752    PATIENT IDENTIFICATION ERROR. PLEASE DISREGARD RESULTS. ACCOUNT WILL BE CREDITED.   COLORURINE YELLOW 05/19/2018 1752   APPEARANCEUR  05/19/2018 1752    PATIENT IDENTIFICATION ERROR. PLEASE DISREGARD RESULTS. ACCOUNT WILL BE CREDITED.   APPEARANCEUR CLEAR 05/19/2018 1752   LABSPEC  05/19/2018 1752    PATIENT IDENTIFICATION ERROR. PLEASE DISREGARD RESULTS. ACCOUNT WILL BE CREDITED.   LABSPEC 1.008 05/19/2018 1752   PHURINE  05/19/2018 1752    PATIENT IDENTIFICATION ERROR. PLEASE DISREGARD RESULTS. ACCOUNT WILL BE CREDITED.   PHURINE 7.0 05/19/2018 1752   GLUCOSEU  05/19/2018 1752    PATIENT IDENTIFICATION ERROR. PLEASE DISREGARD RESULTS. ACCOUNT WILL BE CREDITED.   GLUCOSEU 150 (A) 05/19/2018 1752   HGBUR  05/19/2018 1752    PATIENT IDENTIFICATION ERROR. PLEASE DISREGARD RESULTS. ACCOUNT WILL BE CREDITED.   HGBUR NEGATIVE 05/19/2018 1752   HGBUR small 03/10/2010 1445   BILIRUBINUR  05/19/2018 1752    PATIENT IDENTIFICATION ERROR. PLEASE DISREGARD RESULTS. ACCOUNT WILL BE CREDITED.   BILIRUBINUR NEGATIVE 05/19/2018 1752   BILIRUBINUR negative 04/25/2018 1013    BILIRUBINUR neg 03/28/2018 0916   KETONESUR  05/19/2018 1752    PATIENT IDENTIFICATION ERROR. PLEASE DISREGARD RESULTS. ACCOUNT WILL BE CREDITED.   KETONESUR NEGATIVE 05/19/2018 1752   PROTEINUR  05/19/2018 1752    PATIENT IDENTIFICATION ERROR. PLEASE DISREGARD RESULTS. ACCOUNT WILL BE CREDITED.   PROTEINUR >=300 (A) 05/19/2018 1752   UROBILINOGEN 1.0 04/25/2018 1013   UROBILINOGEN 0.2 05/11/2017 1335   NITRITE  05/19/2018 1752    PATIENT IDENTIFICATION ERROR. PLEASE DISREGARD RESULTS. ACCOUNT WILL BE CREDITED.   NITRITE NEGATIVE 05/19/2018 1752   LEUKOCYTESUR  05/19/2018 1752    PATIENT IDENTIFICATION ERROR. PLEASE DISREGARD RESULTS. ACCOUNT WILL BE CREDITED.   LEUKOCYTESUR NEGATIVE 05/19/2018 1752   Sepsis Labs: @LABRCNTIP (procalcitonin:4,lacticidven:4) )No results found for this or any previous visit (from the past 240 hour(s)).   Radiological Exams on Admission: Dg Chest Port 1 View  Result Date: 07/03/2018 CLINICAL DATA:  Chest pain EXAM: PORTABLE CHEST 1 VIEW COMPARISON:  Chest x-ray dated 05/05/2018 FINDINGS: Heart size is stable. There are hazy airspace opacities bilaterally. No pneumothorax. No large pleural effusion. Avascular necrosis is noted of the right humeral head. There are sclerotic areas in the proximal right humerus. IMPRESSION: 1. Hazy airspace opacities bilaterally which are nonspecific but can be seen in patients with a sickle cell crisis. 2. Avascular necrosis of the right humeral head. Electronically Signed   By: Constance Holster M.D.   On: 07/03/2018 17:22    EKG: Independently reviewed.  Normal sinus rhythm.  Assessment/Plan Principal Problem:   Sickle cell pain crisis (HCC) Active Problems:   Nephrotic syndrome   CKD (chronic kidney disease), stage III (HCC)   Hyperkalemia    1. Sickle cell pain crisis -patient has been placed on Dilaudid PCA will continue with long-acting pain medication.  Patient is also on gabapentin. 2. Worsening anemia likely  from hemolysis for which patient is ordered PRBC transfusion.  Follow CBC.  Holding hydroxyurea for now due to worsening anemia 3. Hyperkalemia with history of chronic kidney disease stage III on prednisone for nephrotic syndrome -patient was given Lokelma D50 and IV insulin calcium gluconate.  Kept patient on renal diet.  Follow metabolic panel.  Patient is on prednisone but is not clearly able to tell how long.  I discussed with patient's nephrologist in the morning.  On gentle hydration for now but avoid nephrotoxic medications. 4. History of polysubstance abuse urine drug screen pending.   Note that -patient became lethargic for which patient's Dilaudid PCA was briefly discontinued and was given Narcan 1 dose.  Following which patient became more alert awake and patient's pain started becoming worse.  For which I started Dilaudid and a lower dose.   DVT prophylaxis: Heparin. Code Status: Full code. Family Communication: Discussed with patient. Disposition Plan: Home. Consults called: None. Admission status: Inpatient.   [name] MD [facility] Pager [phone].  If 7PM-7AM, please contact night-coverage [email or web address] Password [facility]  07/03/2018, 9:21 PM

## 2018-07-03 NOTE — ED Notes (Signed)
Date and time results received: 07/03/18 8:02 PM   Test: Potassium Critical Value: 6.3  Name of Provider Notified: Abigail PA  Orders Received? Or Actions Taken?: Orders Received - See Orders for details

## 2018-07-03 NOTE — ED Notes (Signed)
Patient refused for this writer to start IV. Pt refused for this writer to start IV. Pt stated he needs an ultrasound IV because he's a difficult IV start.
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
## 2018-07-03 NOTE — ED Notes (Addendum)
Date and time results received: 07/03/18 6:31 PM  Test: Hemoglobin Critical Value: 5.8  Name of Provider Notified: Abigail PA  Orders Received? Or Actions Taken?: awaiting further orders

## 2018-07-03 NOTE — ED Notes (Signed)
Gave report to [place], Therapist, sports for room [phone]

## 2018-07-03 NOTE — ED Notes (Signed)
ED TO INPATIENT HANDOFF REPORT  ED Nurse Name and Phone #: Di Kindle  [phone]  S Name/Age/Gender [name] [name]. 38 y.o. male Room/Bed: WA19/WA19  Code Status   Code Status: Full Code  Home/SNF/Other Home Patient oriented to: self, place, time and situation Is this baseline? Yes   Triage Complete: Triage complete  Chief Complaint Sickle Cell crisis  Triage Note No notes on file   Allergies Allergies  Allergen Reactions  . Nsaids Other (See Comments)    Kidney disease  . Morphine And Related Other (See Comments)    "real bad headaches"    Level of Care/Admitting Diagnosis ED Disposition    ED Disposition Condition [name] [[id number]]  Level of Care: Telemetry [5]  Admit to tele based on following criteria: Monitor QTC interval  Covid Evaluation: Screening Protocol (No Symptoms)  Diagnosis: Sickle cell pain crisis [facility]) [[id number]]  Admitting Physician: [name] [phone]  Attending Physician: [name] [phone]  Estimated length of stay: past midnight tomorrow  Certification:: I certify this patient will need inpatient services for at least 2 midnights  PT Class (Do Not Modify): Inpatient [101]  PT Acc Code (Do Not Modify): Private [1]       B Medical/Surgery History Past Medical History:  Diagnosis Date  . Arachnoid Cyst of brain bilaterally    "2 really small ones in the back of my head; inside; saw them w/MRI" (09/25/2012)  . Bacterial pneumonia ~ 2012   "caught it here in the hospital" (09/25/2012)  . Chronic kidney disease    "from my sickle cell" (09/25/2012)  . CKD (chronic kidney disease), stage II   . GERD (gastroesophageal reflux disease)    "after I eat alot of spicey foods" (09/25/2012)  . Gynecomastia, male 07/10/2012  . History of blood transfusion    "always related to sickle cell crisis" (09/25/2012)  . 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 Migraines    "take RX qd to prevent them" (09/25/2012)  . Sickle cell anemia (HCC)   . Sickle cell crisis ([place]) 09/25/2012  . Sickle cell nephropathy ([place]) 07/10/2012  . Sinus tachycardia   . Tachycardia with heart rate 121-140 beats per minute with ambulation 08/04/2016   Past Surgical History:  Procedure Laterality Date  . CHOLECYSTECTOMY  ~ 2012  . COLONOSCOPY N/A 04/23/2017   Procedure: COLONOSCOPY;  Surgeon: Irene Shipper, MD;  Location: [facility];  Service: Endoscopy;  Laterality: N/A;  . IR FLUORO GUIDE CV LINE RIGHT  12/17/2016  . IR REMOVAL TUN CV CATH W/O FL  12/21/2016  . IR US GUIDE VASC ACCESS RIGHT  12/17/2016  . spleenectomy       A IV Location/Drains/Wounds Patient Lines/Drains/Airways Status   Active Line/Drains/Airways    Name:   Placement date:   Placement time:   Site:   Days:   Peripheral IV 07/03/18 Right;Distal Arm   07/03/18    1747    Arm   less than 1   Incision (Closed) 04/25/17 Flank Left   04/25/17    1950     434          Intake/Output Last 24 hours No intake or output data in the 24 hours ending 07/03/18 2200  Labs/Imaging Results for orders placed or performed during the hospital encounter of 07/03/18 (from the past 48 hour(s))  CBC WITH DIFFERENTIAL  Status: Abnormal   Collection Time: 07/03/18  4:29 PM  Result Value Ref Range   WBC 9.1 4.0 - 10.5 K/uL    Comment: ADJUSTED FOR NUCLEATED RBC'S   RBC 1.31 (L) 4.22 - 5.81 MIL/uL   Hemoglobin 5.8 (LL) 13.0 - 17.0 g/dL    Comment: REPEATED TO VERIFY THIS CRITICAL RESULT HAS VERIFIED AND BEEN CALLED TO S.LEONARD BY NATHAN THOMPSON ON 05 20 2020 AT 1829, AND HAS BEEN READ BACK. CRITICAL RESULT VERIFIED    HCT 16.9 (L) 39.0 - 52.0 %   MCV 129.0 (H) 80.0 - 100.0 fL   MCH 44.3 (H) 26.0 - 34.0 pg   MCHC 34.3 30.0 - 36.0 g/dL   Platelets 204 150 - 400 K/uL   nRBC 18.0 (H) 0.0 - 0.2 %   Neutrophils Relative % 71 %   Neutro Abs 6.5 1.7 - 7.7 K/uL    Lymphocytes Relative 23 %   Lymphs Abs 2.1 0.7 - 4.0 K/uL   Monocytes Relative 5 %   Monocytes Absolute 0.4 0.1 - 1.0 K/uL   Eosinophils Relative 0 %   Eosinophils Absolute 0.0 0.0 - 0.5 K/uL   Basophils Relative 0 %   Basophils Absolute 0.0 0.0 - 0.1 K/uL   RBC Morphology See Note     Comment: MANY NRBC'S PRESENT   Immature Granulocytes 1 %   Abs Immature Granulocytes 0.05 0.00 - 0.07 K/uL   Schistocytes PRESENT    Polychromasia PRESENT    Target Cells PRESENT     Comment: Performed at [facility], [place] [address] [place], [place] [address]  Reticulocytes     Status: Abnormal   Collection Time: 07/03/18  4:29 PM  Result Value Ref Range   Retic Ct Pct 7.4 (H) 0.4 - 3.1 %   RBC. 1.31 (L) 4.22 - 5.81 MIL/uL   Retic Count, Absolute 96.4 19.0 - 186.0 K/uL   Immature Retic Fract 45.5 (H) 2.3 - 15.9 %    Comment: Performed at [facility], [place] [address], [place], [place] [address]  Comprehensive metabolic panel     Status: Abnormal   Collection Time: 07/03/18  4:29 PM  Result Value Ref Range   Sodium 138 135 - 145 mmol/L   Potassium 6.1 (H) 3.5 - 5.1 mmol/L   Chloride 115 (H) 98 - 111 mmol/L   CO2 19 (L) 22 - 32 mmol/L   Glucose, Bld 118 (H) 70 - 99 mg/dL   BUN 52 (H) 6 - 20 mg/dL   Creatinine, Ser 3.00 (H) 0.61 - 1.24 mg/dL   Calcium 7.5 (L) 8.9 - 10.3 mg/dL   Total Protein 5.0 (L) 6.5 - 8.1 g/dL   Albumin 2.4 (L) 3.5 - 5.0 g/dL   AST 29 15 - 41 U/L   ALT 38 0 - 44 U/L   Alkaline Phosphatase 178 (H) 38 - 126 U/L   Total Bilirubin 1.5 (H) 0.3 - 1.2 mg/dL   GFR calc non Af Amer 26 (L) >60 mL/min   GFR calc Af Amer 30 (L) >60 mL/min   Anion gap 4 (L) 5 - 15    Comment: Performed at [facility], [place] [address], [place], [place] [address]  Lactate dehydrogenase     Status: None   Collection Time: 07/03/18  4:29 PM  Result Value Ref Range   LDH 181 98 - 192 U/L    Comment: Performed at [facility],  [place] [address] [place], [place]  [address]  Type and screen [place]     Status: None (Preliminary result)   Collection Time: 07/03/18  5:42 PM  Result Value Ref Range   ABO/RH(D) O POS    Antibody Screen NEG    Sample Expiration      07/06/2018,2359 Performed at [facility], [place] [address], [place], [place] [address]    Unit Number [id number]    Blood Component Type RED CELLS,LR    Unit division 00    Status of Unit ALLOCATED    Transfusion Status OK TO TRANSFUSE    Crossmatch Result Compatible   Potassium     Status: Abnormal   Collection Time: 07/03/18  7:36 PM  Result Value Ref Range   Potassium 6.3 (HH) 3.5 - 5.1 mmol/L    Comment: CRITICAL RESULT CALLED TO, READ BACK BY AND VERIFIED WITH: LEONARD,S. RN @2001  ON 05.20.2020 BY COHEN,K Performed at [facility], [place] [address] [place], [place] [address]   Prepare RBC     Status: None   Collection Time: 07/03/18  7:51 PM  Result Value Ref Range   Order Confirmation      ORDER PROCESSED BY BLOOD BANK Performed at [facility], [place] [address], [place],  [address]    Dg Chest Port 1 View  Result Date: 07/03/2018 CLINICAL DATA:  Chest pain EXAM: PORTABLE CHEST 1 VIEW COMPARISON:  Chest x-ray dated 05/05/2018 FINDINGS: Heart size is stable. There are hazy airspace opacities bilaterally. No pneumothorax. No large pleural effusion. Avascular necrosis is noted of the right humeral head. There are sclerotic areas in the proximal right humerus. IMPRESSION: 1. Hazy airspace opacities bilaterally which are nonspecific but can be seen in patients with a sickle cell crisis. 2. Avascular necrosis of the right humeral head. Electronically Signed   By: Constance Holster M.D.   On: 07/03/2018 17:22    Pending Labs Unresulted Labs (From admission, onward)    Start     Ordered   07/04/18 2263  Basic metabolic panel  Tomorrow morning,   R     07/03/18  2120   07/04/18 0500  CBC with Differential/Platelet  Tomorrow morning,   R     07/03/18 2120   07/03/18 2119  CBC  (heparin)  Once,   R    Comments:  Baseline for heparin therapy IF NOT ALREADY DRAWN.  Notify MD if PLT < 100 K.    07/03/18 2120   07/03/18 2119  Creatinine, serum  (heparin)  Once,   R    Comments:  Baseline for heparin therapy IF NOT ALREADY DRAWN.    07/03/18 2120   07/03/18 1951  SARS Coronavirus 2 (CEPHEID - Performed in [facility] hospital lab), Hosp Order  (Asymptomatic Patients Labs)  Once,   R    Question:  Rule Out  Answer:  Yes   07/03/18 1950   07/03/18 1629  Urinalysis, Routine w reflex microscopic  ONCE - STAT,   STAT     07/03/18 1635          Vitals/Pain Today's Vitals   07/03/18 2045 07/03/18 2100 07/03/18 2109 07/03/18 2115  BP: 124/79 122/83  120/85  Pulse: 100 96  97  Resp: 10 15  13   Temp:      TempSrc:      SpO2: 98% 98%  96%  Weight:      Height:      PainSc:   8  Isolation Precautions No active isolations  Medications Medications  sodium zirconium cyclosilicate (LOKELMA) packet 10 g (10 g Oral Given 07/03/18 2109)  predniSONE (DELTASONE) tablet 60 mg (has no administration in time range)  vitamin B-12 (CYANOCOBALAMIN) tablet 2,000 mcg (has no administration in time range)  folic acid (FOLVITE) tablet 1 mg (has no administration in time range)  gabapentin (NEURONTIN) capsule 300 mg (has no administration in time range)  cholecalciferol (VITAMIN D3) tablet 2,000 Units (has no administration in time range)  senna-docusate (Senokot-S) tablet 1 tablet (has no administration in time range)  polyethylene glycol (MIRALAX / GLYCOLAX) packet 17 g (has no administration in time range)  heparin injection 5,000 Units (has no administration in time range)  naloxone (NARCAN) injection 0.4 mg (has no administration in time range)    And  sodium chloride flush (NS) 0.9 % injection 9 mL (has no administration in time range)  HYDROmorphone  (DILAUDID) 1 mg/mL PCA injection (has no administration in time range)  HYDROmorphone (DILAUDID) injection 0.5 mg (0.5 mg Intravenous Given 07/03/18 1753)    Or  HYDROmorphone (DILAUDID) injection 0.5 mg ( Subcutaneous See Alternative 07/03/18 1753)  HYDROmorphone (DILAUDID) injection 1 mg (1 mg Intravenous Given 07/03/18 1835)    Or  HYDROmorphone (DILAUDID) injection 1 mg ( Subcutaneous See Alternative 07/03/18 1835)  HYDROmorphone (DILAUDID) injection 1 mg (1 mg Intravenous Given 07/03/18 2119)    Or  HYDROmorphone (DILAUDID) injection 1 mg ( Subcutaneous See Alternative 07/03/18 2119)  diphenhydrAMINE (BENADRYL) injection 25 mg (25 mg Intravenous Given 07/03/18 1752)  calcium gluconate inj 10% (1 g) URGENT USE ONLY! (1 g Intravenous Given 07/03/18 2117)  insulin aspart (novoLOG) injection 5 Units (5 Units Intravenous Given 07/03/18 2110)    And  dextrose 50 % solution 50 mL (50 mLs Intravenous Given 07/03/18 2111)  sodium bicarbonate injection 50 mEq (50 mEq Intravenous Given 07/03/18 2114)    Mobility walks Low fall risk   Focused Assessments Pain    R Recommendations: See Admitting Provider Note  Report given to:   Additional Notes:

## 2018-07-03 NOTE — ED Notes (Signed)
Pt sts he is unable to give a urine sample at this time due to being in a lot of pain

## 2018-07-04 ENCOUNTER — Other Ambulatory Visit: Payer: Self-pay

## 2018-07-04 DIAGNOSIS — E875 Hyperkalemia: Secondary | ICD-10-CM

## 2018-07-04 DIAGNOSIS — N049 Nephrotic syndrome with unspecified morphologic changes: Secondary | ICD-10-CM

## 2018-07-04 DIAGNOSIS — D5700 Hb-SS disease with crisis, unspecified: Principal | ICD-10-CM

## 2018-07-04 DIAGNOSIS — N183 Chronic kidney disease, stage 3 (moderate): Secondary | ICD-10-CM

## 2018-07-04 LAB — CBC WITH DIFFERENTIAL/PLATELET
Abs Immature Granulocytes: 0.09 10*3/uL — ABNORMAL HIGH (ref 0.00–0.07)
Basophils Absolute: 0 10*3/uL (ref 0.0–0.1)
Basophils Relative: 0 %
Eosinophils Absolute: 0 10*3/uL (ref 0.0–0.5)
Eosinophils Relative: 0 %
HCT: 21.1 % — ABNORMAL LOW (ref 39.0–52.0)
Hemoglobin: 7.3 g/dL — ABNORMAL LOW (ref 13.0–17.0)
Immature Granulocytes: 1 %
Lymphocytes Relative: 48 %
Lymphs Abs: 6.9 10*3/uL — ABNORMAL HIGH (ref 0.7–4.0)
MCH: 39.9 pg — ABNORMAL HIGH (ref 26.0–34.0)
MCHC: 34.6 g/dL (ref 30.0–36.0)
MCV: 115.3 fL — ABNORMAL HIGH (ref 80.0–100.0)
Monocytes Absolute: 0.9 10*3/uL (ref 0.1–1.0)
Monocytes Relative: 6 %
Neutro Abs: 6.5 10*3/uL (ref 1.7–7.7)
Neutrophils Relative %: 45 %
Platelets: 170 10*3/uL (ref 150–400)
RBC: 1.83 MIL/uL — ABNORMAL LOW (ref 4.22–5.81)
WBC: 14.4 10*3/uL — ABNORMAL HIGH (ref 4.0–10.5)
nRBC: 10.3 % — ABNORMAL HIGH (ref 0.0–0.2)

## 2018-07-04 LAB — TYPE AND SCREEN
ABO/RH(D): O POS
Antibody Screen: NEGATIVE
Unit division: 0

## 2018-07-04 LAB — BASIC METABOLIC PANEL
Anion gap: 7 (ref 5–15)
BUN: 52 mg/dL — ABNORMAL HIGH (ref 6–20)
CO2: 20 mmol/L — ABNORMAL LOW (ref 22–32)
Calcium: 7.6 mg/dL — ABNORMAL LOW (ref 8.9–10.3)
Chloride: 111 mmol/L (ref 98–111)
Creatinine, Ser: 2.79 mg/dL — ABNORMAL HIGH (ref 0.61–1.24)
GFR calc Af Amer: 33 mL/min — ABNORMAL LOW (ref >60)
GFR calc non Af Amer: 28 mL/min — ABNORMAL LOW (ref >60)
Glucose, Bld: 101 mg/dL — ABNORMAL HIGH (ref 70–99)
Potassium: 4.2 mmol/L (ref 3.5–5.1)
Sodium: 138 mmol/L (ref 135–145)

## 2018-07-04 LAB — BPAM RBC
Blood Product Expiration Date: 202006142359
ISSUE DATE / TIME: 202005202347
Unit Type and Rh: 5100

## 2018-07-04 LAB — RAPID URINE DRUG SCREEN, HOSP PERFORMED
Amphetamines: NOT DETECTED
Barbiturates: NOT DETECTED
Benzodiazepines: NOT DETECTED
Cocaine: POSITIVE — AB
Opiates: NOT DETECTED
Tetrahydrocannabinol: POSITIVE — AB

## 2018-07-04 MED ORDER — HYDROMORPHONE HCL 2 MG PO TABS: 2.0000 mg | ORAL_TABLET | ORAL | Status: DC

## 2018-07-04 MED ORDER — HYDROMORPHONE 1 MG/ML IV SOLN
INTRAVENOUS | Status: DC
  Administered 2018-07-04 (×2): 1.8 mg via INTRAVENOUS

## 2018-07-04 MED ORDER — HYDROMORPHONE HCL 1 MG/ML IJ SOLN
0.5000 mg | INTRAMUSCULAR | Status: DC | PRN
  Administered 2018-07-04 – 2018-07-05 (×2): 0.5 mg via INTRAVENOUS
  Filled 2018-07-04 (×2): qty 0.5

## 2018-07-04 MED ORDER — HYDROMORPHONE HCL 1 MG/ML IJ SOLN: 2.0000 mg | INTRAMUSCULAR | Status: DC | PRN

## 2018-07-04 MED ORDER — HYDROMORPHONE HCL 2 MG PO TABS: 4.0000 mg | ORAL_TABLET | ORAL | Status: DC

## 2018-07-04 MED ORDER — NALOXONE HCL 0.4 MG/ML IJ SOLN
0.4000 mg | Freq: Once | INTRAMUSCULAR | Status: AC
  Administered 2018-07-04: 0.4 mg via INTRAVENOUS

## 2018-07-04 MED ORDER — SODIUM CHLORIDE 0.9% FLUSH: 9.0000 mL | INTRAVENOUS | Status: DC | PRN

## 2018-07-04 MED ORDER — NALOXONE HCL 0.4 MG/ML IJ SOLN: 0.4000 mg | INTRAMUSCULAR | Status: DC | PRN

## 2018-07-04 MED ORDER — HYDROMORPHONE HCL 2 MG PO TABS
2.0000 mg | ORAL_TABLET | ORAL | Status: DC
  Administered 2018-07-04 – 2018-07-05 (×5): 2 mg via ORAL
  Filled 2018-07-04 (×5): qty 1

## 2018-07-04 MED ORDER — OXYCODONE HCL 5 MG PO TABS
15.0000 mg | ORAL_TABLET | ORAL | Status: DC | PRN
  Filled 2018-07-04: qty 3

## 2018-07-04 NOTE — Progress Notes (Addendum)
Subjective: [name] [name], a 38 year old male with a medical history significant for sickle cell disease, chronic kidney disease/nephrotic syndrome, anemia of chronic disease, and polysubstance abuse was admitted for sickle cell pain crisis.  On admission, patient had a hemoglobin of 5.8.  He was transfused 1 unit of PRBCs.  Also, potassium 6.1, which is decreased to baseline.  Patient is complaining of increased pain primarily to low back and lower extremities. He is very agitated and using profanity. He states that he has never been able to get his pain controlled on oral medications.  Overnight, patient had to receive Narcan due to inability to arouse.  Respirations remain low at 6.  Patient states that pain intensity is 10/10 and he is unable to get comfortable.  Patient is also complaining about fluid restriction and renal diet.  He states, "is my body, I can eat what ever I want".  Patient denies headache, dizziness, chest pain, shortness of breath, nausea, vomiting, or diarrhea. He is afebrile and maintaining oxygen saturation 100% on RA. Objective:  Vital signs in last 24 hours:  Vitals:   07/04/18 0504 07/04/18 0752 07/04/18 1156 07/04/18 1245  BP: 114/81   117/79  Pulse: 81   79  Resp: 18 10 19 16   Temp: 97.9 F (36.6 C)   98 F (36.7 C)  TempSrc: Oral   Oral  SpO2: 100% 99% 100% 100%  Weight:      Height:        Intake/Output from previous day:   Intake/Output Summary (Last 24 hours) at 07/04/2018 1614 Last data filed at 07/04/2018 1300 Gross per 24 hour  Intake 362 ml  Output 2075 ml  Net -1713 ml    Physical Exam: General: Alert, awake, oriented x3, in no acute distress.  HEENT: [place]/AT PEERL, EOMI. Scleral icterus Neck: Trachea midline,  no masses, no thyromegal,y no JVD, no carotid bruit OROPHARYNX:  Moist, No exudate/ erythema/lesions.  Heart: Regular rate and rhythm, without murmurs, rubs, gallops, PMI non-displaced, no heaves or thrills on palpation.   Lungs: Clear to auscultation, no wheezing or rhonchi noted. No increased vocal fremitus resonant to percussion  Abdomen: Soft, nontender, nondistended, positive bowel sounds, no masses no hepatosplenomegaly noted..  Neuro: No focal neurological deficits noted cranial nerves II through XII grossly intact. DTRs 2+ bilaterally upper and lower extremities. Strength 5 out of 5 in bilateral upper and lower extremities. Musculoskeletal: No warm swelling or erythema around joints, no spinal tenderness noted. Psychiatric: Patient alert and oriented x3, good insight and cognition, good recent to remote recall. Lymph node survey: No cervical axillary or inguinal lymphadenopathy noted.  Lab Results:  Basic Metabolic Panel:    Component Value Date/Time   NA 138 07/04/2018 0545   NA 138 06/13/2017 1102   K 4.2 07/04/2018 0545   CL 111 07/04/2018 0545   CO2 20 (L) 07/04/2018 0545   BUN 52 (H) 07/04/2018 0545   BUN 18 06/13/2017 1102   CREATININE 2.79 (H) 07/04/2018 0545   CREATININE 1.45 (H) 05/12/2014 1430   GLUCOSE 101 (H) 07/04/2018 0545   CALCIUM 7.6 (L) 07/04/2018 0545   CBC:    Component Value Date/Time   WBC 14.4 (H) 07/04/2018 0545   HGB 7.3 (L) 07/04/2018 0545   HGB 7.8 (L) 03/28/2018 0942   HCT 21.1 (L) 07/04/2018 0545   HCT 22.8 (L) 03/28/2018 0942   PLT 170 07/04/2018 0545   PLT 267 03/28/2018 0942   MCV 115.3 (H) 07/04/2018 0545   MCV 115 (  H) 03/28/2018 0942   NEUTROABS 6.5 07/04/2018 0545   NEUTROABS 2.2 03/28/2018 0942   LYMPHSABS 6.9 (H) 07/04/2018 0545   LYMPHSABS 3.3 (H) 03/28/2018 0942   MONOABS 0.9 07/04/2018 0545   EOSABS 0.0 07/04/2018 0545   EOSABS 0.1 03/28/2018 0942   BASOSABS 0.0 07/04/2018 0545   BASOSABS 0.0 03/28/2018 0942    Recent Results (from the past 240 hour(s))  SARS Coronavirus 2 (CEPHEID - Performed in [facility] hospital lab), Hosp Order     Status: None   Collection Time: 07/03/18  7:51 PM  Result Value Ref Range Status   SARS Coronavirus  2 NEGATIVE NEGATIVE Final    Comment: (NOTE) If result is NEGATIVE SARS-CoV-2 target nucleic acids are NOT DETECTED. The SARS-CoV-2 RNA is generally detectable in upper and lower  respiratory specimens during the acute phase of infection. The lowest  concentration of SARS-CoV-2 viral copies this assay can detect is 250  copies / mL. A negative result does not preclude SARS-CoV-2 infection  and should not be used as the sole basis for treatment or other  patient management decisions.  A negative result may occur with  improper specimen collection / handling, submission of specimen other  than nasopharyngeal swab, presence of viral mutation(s) within the  areas targeted by this assay, and inadequate number of viral copies  (<250 copies / mL). A negative result must be combined with clinical  observations, patient history, and epidemiological information. If result is POSITIVE SARS-CoV-2 target nucleic acids are DETECTED. The SARS-CoV-2 RNA is generally detectable in upper and lower  respiratory specimens dur ing the acute phase of infection.  Positive  results are indicative of active infection with SARS-CoV-2.  Clinical  correlation with patient history and other diagnostic information is  necessary to determine patient infection status.  Positive results do  not rule out bacterial infection or co-infection with other viruses. If result is PRESUMPTIVE POSTIVE SARS-CoV-2 nucleic acids MAY BE PRESENT.   A presumptive positive result was obtained on the submitted specimen  and confirmed on repeat testing.  While 2019 novel coronavirus  (SARS-CoV-2) nucleic acids may be present in the submitted sample  additional confirmatory testing may be necessary for epidemiological  and / or clinical management purposes  to differentiate between  SARS-CoV-2 and other Sarbecovirus currently known to infect humans.  If clinically indicated additional testing with an alternate test  methodology  [phone]) is advised. The SARS-CoV-2 RNA is generally  detectable in upper and lower respiratory sp ecimens during the acute  phase of infection. The expected result is Negative. Fact Sheet for Patients:  StrictlyIdeas.no Fact Sheet for Healthcare Providers: BankingDealers.co.za This test is not yet approved or cleared by the [place] FDA and has been authorized for detection and/or diagnosis of SARS-CoV-2 by FDA under an Emergency Use Authorization (EUA).  This EUA will remain in effect (meaning this test can be used) for the duration of the COVID-19 declaration under Section 564(b)(1) of the Act, 21 U.S.C. section 360bbb-3(b)(1), unless the authorization is terminated or revoked sooner. Performed at [facility], [place] [address] [place], [place] [address]     Studies/Results: Dg Chest Port 1 View  Result Date: 07/03/2018 CLINICAL DATA:  Chest pain EXAM: PORTABLE CHEST 1 VIEW COMPARISON:  Chest x-ray dated 05/05/2018 FINDINGS: Heart size is stable. There are hazy airspace opacities bilaterally. No pneumothorax. No large pleural effusion. Avascular necrosis is noted of the right humeral head. There are sclerotic areas in the proximal right  humerus. IMPRESSION: 1. Hazy airspace opacities bilaterally which are nonspecific but can be seen in patients with a sickle cell crisis. 2. Avascular necrosis of the right humeral head. Electronically Signed   By: Constance Holster M.D.   On: 07/03/2018 17:22    Medications: Scheduled Meds: . cholecalciferol  2,000 Units Oral BID  . folic acid  1 mg Oral Daily  . gabapentin  300 mg Oral QHS  . heparin  5,000 Units Subcutaneous Q8H  . HYDROmorphone  2 mg Oral Q4H while awake  . predniSONE  60 mg Oral Daily  . senna-docusate  1 tablet Oral BID  . sodium zirconium cyclosilicate  10 g Oral TID  . cyanocobalamin  2,000 mcg Oral Daily   Continuous Infusions: PRN Meds:.naLOXone  (NARCAN)  injection, polyethylene glycol   Assessment/Plan: Principal Problem:   Sickle cell pain crisis (HCC) Active Problems:   Nephrotic syndrome   CKD (chronic kidney disease), stage III (HCC)   Hyperkalemia   Sickle cell disease with pain crisis: On admission, patient was placed on Dilaudid PCA.  However urine drug screen positive for cocaine.  IV medications discontinued.   Patient has not been prescribed opiates since early March.  He is mostly opiate nave.  Will start Dilaudid 2 mg every 4 hours while awake. Folic acid 1 mg daily Continue IV fluids Avoid all nephrotoxic medications due to chronic kidney disease Maintain oxygen saturation above 90%  Anemia of chronic disease: Patient was transfused 1 unit of PRBCs.  Hemoglobin is returned to baseline and is 7.3.  Continue to follow CBC and repeat in a.m.  Acute renal failure superimposed on chronic kidney disease stage III:   Secondary to sickle cell nephropathy. He follows up with nephrology as an outpatient.  Worsening renal function in the presence of CKD Patient is currently having good urine output Continue gentle hydration and follow BMP in am. Continue fluid restriction and renal diet  History of polysubstance abuse:  Urine drug screen is positive for cocaine. Patient refuses SW consult or counseling recommendations. He claims that he has not used cocaine in greater than 1 month. Patient very offensive and belligerent.   Leukocytosis:  WBCs 14.4, more than likely reactive due to sickle cell crisis. Patient afebrile.  Continue to follow CBC, repeat in am.   Acute hyperkalemia:  Resolved. Continue to monitor closely   Code Status: Full Code Family Communication: N/A Disposition Plan: Not yet ready for discharge  [place], MSN, FNP-C Patient [address], [place] [address] [phone]  If 5PM-7AM, please contact  night-coverage.  07/04/2018, 4:14 PM  LOS: 1 day

## 2018-07-04 NOTE — Progress Notes (Signed)
Upon rounding on pt, pt's respiratory rate noted to be 7-8 per minute. Oxygen saturation and eCO2 remain WDL. Pt drowsy at this time, pupils 40mm, round, and sluggish. Hal Hope, MD made aware. PCA turned off and PRN narcan given to pt. Post intervention, pt alert, asking for a sandwich, and respiratory rate increased to 14-18. Pt upset that PCA has been turned off at this time. Education provided. MD adjusted pt's dilaudid dose. Will continue to monitor closely.

## 2018-07-04 NOTE — Plan of Care (Signed)
  Problem: Education: Goal: Knowledge of General Education information will improve Description Including pain rating scale, medication(s)/side effects and non-pharmacologic comfort measures Outcome: Progressing   Problem: Health Behavior/Discharge Planning: Goal: Ability to manage health-related needs will improve Outcome: Progressing   Problem: Clinical Measurements: Goal: Ability to maintain clinical measurements within normal limits will improve Outcome: Progressing Goal: Will remain free from infection Outcome: Progressing Goal: Diagnostic test results will improve Outcome: Progressing Goal: Respiratory complications will improve Outcome: Progressing Goal: Cardiovascular complication will be avoided Outcome: Progressing   Problem: Activity: Goal: Risk for activity intolerance will decrease Outcome: Progressing   Problem: Nutrition: Goal: Adequate nutrition will be maintained Outcome: Progressing   Problem: Coping: Goal: Level of anxiety will decrease Outcome: Progressing   Problem: Elimination: Goal: Will not experience complications related to bowel motility Outcome: Progressing Goal: Will not experience complications related to urinary retention Outcome: Progressing   Problem: Pain Managment: Goal: General experience of comfort will improve Outcome: Progressing   Problem: Skin Integrity: Goal: Risk for impaired skin integrity will decrease Outcome: Progressing   Problem: Education: Goal: Knowledge of vaso-occlusive preventative measures will improve Outcome: Progressing Goal: Awareness of infection prevention will improve Outcome: Progressing Goal: Awareness of signs and symptoms of anemia will improve Outcome: Progressing Goal: Long-term complications will improve Outcome: Progressing   Problem: Self-Care: Goal: Ability to incorporate actions that prevent/reduce pain crisis will improve Outcome: Progressing   Problem: Bowel/Gastric: Goal: Gut  motility will be maintained Outcome: Progressing   Problem: Tissue Perfusion: Goal: Complications related to inadequate tissue perfusion will be avoided or minimized Outcome: Progressing   Problem: Respiratory: Goal: Pulmonary complications will be avoided or minimized Outcome: Progressing Goal: Acute Chest Syndrome will be identified early to prevent complications Outcome: Progressing   Problem: Fluid Volume: Goal: Ability to maintain a balanced intake and output will improve Outcome: Progressing   Problem: Sensory: Goal: Pain level will decrease with appropriate interventions Outcome: Progressing   Problem: Health Behavior: Goal: Postive changes in compliance with treatment and prescription regimens will improve Outcome: Progressing

## 2018-07-04 NOTE — Progress Notes (Signed)
Patient called out to RN to ask if he could take a shower. Patient is on telemetry. MD covering for sickle cell was paged and said he didn't feel comfortable letting him take a shower yet and he would rather him wait until the morning to be able to talk to attending following patient. RN let patient know, patient said "I'm going to take a shower regardless of an order or not. You can either take this heart monitor off and wrap these IV's or else i'm just going to go ahead and get in." RN let the Assistant Director know of this and she went in to talk with patient. He said to her the same thing that was said to the nurse and she let him know that we will just document him being noncompliant. Patient has already taken off heart monitor to get into shower.   Timoteo Gaul, RN

## 2018-07-04 NOTE — Progress Notes (Addendum)
Resumed care of pt. Agree with previous RN's assessment. Will continue with plan of care.  

## 2018-07-05 LAB — CBC
HCT: 24.3 % — ABNORMAL LOW (ref 39.0–52.0)
Hemoglobin: 7.9 g/dL — ABNORMAL LOW (ref 13.0–17.0)
MCH: 38.9 pg — ABNORMAL HIGH (ref 26.0–34.0)
MCHC: 32.5 g/dL (ref 30.0–36.0)
MCV: 119.7 fL — ABNORMAL HIGH (ref 80.0–100.0)
Platelets: 180 10*3/uL (ref 150–400)
RBC: 2.03 MIL/uL — ABNORMAL LOW (ref 4.22–5.81)
WBC: 15.8 10*3/uL — ABNORMAL HIGH (ref 4.0–10.5)
nRBC: 10 % — ABNORMAL HIGH (ref 0.0–0.2)

## 2018-07-05 LAB — BASIC METABOLIC PANEL
Anion gap: 6 (ref 5–15)
BUN: 52 mg/dL — ABNORMAL HIGH (ref 6–20)
CO2: 17 mmol/L — ABNORMAL LOW (ref 22–32)
Calcium: 7.8 mg/dL — ABNORMAL LOW (ref 8.9–10.3)
Chloride: 113 mmol/L — ABNORMAL HIGH (ref 98–111)
Creatinine, Ser: 2.55 mg/dL — ABNORMAL HIGH (ref 0.61–1.24)
GFR calc Af Amer: 36 mL/min — ABNORMAL LOW (ref >60)
GFR calc non Af Amer: 31 mL/min — ABNORMAL LOW (ref >60)
Glucose, Bld: 96 mg/dL (ref 70–99)
Potassium: 4.7 mmol/L (ref 3.5–5.1)
Sodium: 136 mmol/L (ref 135–145)

## 2018-07-05 MED ORDER — HYDROMORPHONE HCL 2 MG PO TABS
4.0000 mg | ORAL_TABLET | ORAL | Status: DC
  Administered 2018-07-05 – 2018-07-06 (×6): 4 mg via ORAL
  Filled 2018-07-05 (×6): qty 2

## 2018-07-05 MED ORDER — HYDROMORPHONE HCL 1 MG/ML IJ SOLN
1.0000 mg | INTRAMUSCULAR | Status: DC | PRN
  Administered 2018-07-05 (×3): 1 mg via INTRAVENOUS
  Filled 2018-07-05 (×3): qty 1

## 2018-07-05 MED ORDER — HYDROMORPHONE HCL 1 MG/ML IJ SOLN
1.0000 mg | Freq: Four times a day (QID) | INTRAMUSCULAR | Status: DC | PRN
  Administered 2018-07-05 – 2018-07-06 (×4): 1 mg via INTRAVENOUS
  Filled 2018-07-05 (×4): qty 1

## 2018-07-05 MED ORDER — SODIUM CHLORIDE 0.45 % IV SOLN
INTRAVENOUS | Status: DC
  Administered 2018-07-05 – 2018-07-06 (×4): via INTRAVENOUS

## 2018-07-05 NOTE — Progress Notes (Addendum)
Subjective: [name] [name], a 38 year old male with a medical history significant for sickle cell disease, chronic kidney disease/nephrotic syndrome, anemia of chronic disease, and polysubstance abuse was admitted for sickle cell pain crisis. On admission, patient had a hemoglobin of 5.8.  He was transfused 1 unit of PRBCs.  Hemoglobin is 7.9 today, which is consistent with baseline.  Patient has history of CKD III and nephrotic syndrome, creatinine has improved to 2.55.   Patient continues to complain of bilateral foot pain.  Current pain intensity is 7/10.  Pain is worsened with standing and ambulation.  Patient is afebrile and oxygen saturation at 98% on RA.  Objective:  Vital signs in last 24 hours:  Vitals:   07/04/18 1156 07/04/18 1245 07/04/18 2106 07/05/18 0422  BP:  117/79 117/78 113/78  Pulse:  79 71 72  Resp: 19 16 16 16   Temp:  98 F (36.7 C) 98.4 F (36.9 C) 97.9 F (36.6 C)  TempSrc:  Oral Oral Oral  SpO2: 100% 100% 98% 99%  Weight:      Height:        Intake/Output from previous day:   Intake/Output Summary (Last 24 hours) at 07/05/2018 1008 Last data filed at 07/05/2018 0200 Gross per 24 hour  Intake 360 ml  Output 400 ml  Net -40 ml    Physical Exam: General: Alert, awake, oriented x3, in no acute distress.  HEENT: [place]/AT PEERL, EOMI Neck: Trachea midline,  no masses, no thyromegal,y no JVD, no carotid bruit OROPHARYNX:  Moist, No exudate/ erythema/lesions.  Heart: Regular rate and rhythm, without murmurs, rubs, gallops, PMI non-displaced, no heaves or thrills on palpation.  Lungs: Clear to auscultation, no wheezing or rhonchi noted. No increased vocal fremitus resonant to percussion  Abdomen: Soft, nontender, nondistended, positive bowel sounds, no masses no hepatosplenomegaly noted..  Neuro: No focal neurological deficits noted cranial nerves II through XII grossly intact. DTRs 2+ bilaterally upper and lower extremities. Strength 5 out of 5 in  bilateral upper and lower extremities. Musculoskeletal: No warm swelling or erythema around joints, no spinal tenderness noted. Psychiatric: Patient alert and oriented x3, good insight and cognition, good recent to remote recall. Lymph node survey: No cervical axillary or inguinal lymphadenopathy noted.  Lab Results:  Basic Metabolic Panel:    Component Value Date/Time   NA 136 07/05/2018 0552   NA 138 06/13/2017 1102   K 4.7 07/05/2018 0552   CL 113 (H) 07/05/2018 0552   CO2 17 (L) 07/05/2018 0552   BUN 52 (H) 07/05/2018 0552   BUN 18 06/13/2017 1102   CREATININE 2.55 (H) 07/05/2018 0552   CREATININE 1.45 (H) 05/12/2014 1430   GLUCOSE 96 07/05/2018 0552   CALCIUM 7.8 (L) 07/05/2018 0552   CBC:    Component Value Date/Time   WBC 15.8 (H) 07/05/2018 0552   HGB 7.9 (L) 07/05/2018 0552   HGB 7.8 (L) 03/28/2018 0942   HCT 24.3 (L) 07/05/2018 0552   HCT 22.8 (L) 03/28/2018 0942   PLT 180 07/05/2018 0552   PLT 267 03/28/2018 0942   MCV 119.7 (H) 07/05/2018 0552   MCV 115 (H) 03/28/2018 0942   NEUTROABS 6.5 07/04/2018 0545   NEUTROABS 2.2 03/28/2018 0942   LYMPHSABS 6.9 (H) 07/04/2018 0545   LYMPHSABS 3.3 (H) 03/28/2018 0942   MONOABS 0.9 07/04/2018 0545   EOSABS 0.0 07/04/2018 0545   EOSABS 0.1 03/28/2018 0942   BASOSABS 0.0 07/04/2018 0545   BASOSABS 0.0 03/28/2018 0942    Recent Results (from the  past 240 hour(s))  SARS Coronavirus 2 (CEPHEID - Performed in [place] hospital lab), Hosp Order     Status: None   Collection Time: 07/03/18  7:51 PM  Result Value Ref Range Status   SARS Coronavirus 2 NEGATIVE NEGATIVE Final    Comment: (NOTE) If result is NEGATIVE SARS-CoV-2 target nucleic acids are NOT DETECTED. The SARS-CoV-2 RNA is generally detectable in upper and lower  respiratory specimens during the acute phase of infection. The lowest  concentration of SARS-CoV-2 viral copies this assay can detect is 250  copies / mL. A negative result does not preclude  SARS-CoV-2 infection  and should not be used as the sole basis for treatment or other  patient management decisions.  A negative result may occur with  improper specimen collection / handling, submission of specimen other  than nasopharyngeal swab, presence of viral mutation(s) within the  areas targeted by this assay, and inadequate number of viral copies  (<250 copies / mL). A negative result must be combined with clinical  observations, patient history, and epidemiological information. If result is POSITIVE SARS-CoV-2 target nucleic acids are DETECTED. The SARS-CoV-2 RNA is generally detectable in upper and lower  respiratory specimens dur ing the acute phase of infection.  Positive  results are indicative of active infection with SARS-CoV-2.  Clinical  correlation with patient history and other diagnostic information is  necessary to determine patient infection status.  Positive results do  not rule out bacterial infection or co-infection with other viruses. If result is PRESUMPTIVE POSTIVE SARS-CoV-2 nucleic acids MAY BE PRESENT.   A presumptive positive result was obtained on the submitted specimen  and confirmed on repeat testing.  While 2019 novel coronavirus  (SARS-CoV-2) nucleic acids may be present in the submitted sample  additional confirmatory testing may be necessary for epidemiological  and / or clinical management purposes  to differentiate between  SARS-CoV-2 and other Sarbecovirus currently known to infect humans.  If clinically indicated additional testing with an alternate test  methodology [phone]) is advised. The SARS-CoV-2 RNA is generally  detectable in upper and lower respiratory sp ecimens during the acute  phase of infection. The expected result is Negative. Fact Sheet for Patients:  StrictlyIdeas.no Fact Sheet for Healthcare Providers: BankingDealers.co.za This test is not yet approved or cleared by the  [place] FDA and has been authorized for detection and/or diagnosis of SARS-CoV-2 by FDA under an Emergency Use Authorization (EUA).  This EUA will remain in effect (meaning this test can be used) for the duration of the COVID-19 declaration under Section 564(b)(1) of the Act, 21 U.S.C. section 360bbb-3(b)(1), unless the authorization is terminated or revoked sooner. Performed at [facility], [place] [address], [place], [place] [address]     Studies/Results: Dg Chest Port 1 View  Result Date: 07/03/2018 CLINICAL DATA:  Chest pain EXAM: PORTABLE CHEST 1 VIEW COMPARISON:  Chest x-ray dated 05/05/2018 FINDINGS: Heart size is stable. There are hazy airspace opacities bilaterally. No pneumothorax. No large pleural effusion. Avascular necrosis is noted of the right humeral head. There are sclerotic areas in the proximal right humerus. IMPRESSION: 1. Hazy airspace opacities bilaterally which are nonspecific but can be seen in patients with a sickle cell crisis. 2. Avascular necrosis of the right humeral head. Electronically Signed   By: Constance Holster M.D.   On: 07/03/2018 17:22    Medications: Scheduled Meds: . cholecalciferol  2,000 Units Oral BID  . folic acid  1 mg Oral Daily  .  gabapentin  300 mg Oral QHS  . heparin  5,000 Units Subcutaneous Q8H  . HYDROmorphone  4 mg Oral Q4H while awake  . predniSONE  60 mg Oral Daily  . senna-docusate  1 tablet Oral BID  . cyanocobalamin  2,000 mcg Oral Daily   Continuous Infusions: . sodium chloride     PRN Meds:.HYDROmorphone (DILAUDID) injection, naLOXone (NARCAN)  injection, polyethylene glycol  Assessment/Plan: Principal Problem:   Sickle cell pain crisis (HCC) Active Problems:   Nephrotic syndrome   CKD (chronic kidney disease), stage III (HCC)   Acute hyperkalemia  Sickle cell disease with pain crisis: Continue Dilaudid 4 mg by mouth every 4 hours while awake We will discontinue IV Dilaudid Continue IV  fluids Avoid all nephrotoxic medications due to chronic kidney disease Maintain oxygen saturation above 90%  Anemia of chronic disease: Hemoglobin improved to 7.9.  Patient s/p 1 unit of PRBCs. Continue to follow CBC. Acute renal failure superimposed on chronic kidney disease stage III: Renal failure secondary to sickle cell nephropathy.  Renal function improved on today. Continue gentle hydration Patient is continued to have good urine output Continue fluid restriction and renal diet  BMP in a.m.  History of polysubstance abuse: UDS positive for cocaine.  Patient counseled at length on admission.  He refuses stubby consult or counseling recommendations.  Leukocytosis: WBCs improved.  Suspected to be reactive.  Patient remains afebrile. Continue to follow CBC, repeat in a.m.  Acute hyperkalemia: Resolved.  Continue to follow closely  Code Status: Full Code Family Communication: N/A Disposition Plan: Not yet ready for discharge  [place], MSN, FNP-C Patient [address], [place] [id number] [phone]  If 5PM-7AM, please contact night-coverage.  07/05/2018, 10:08 AM  LOS: 2 days

## 2018-07-05 NOTE — Plan of Care (Signed)
  Problem: Education: Goal: Knowledge of General Education information will improve Description Including pain rating scale, medication(s)/side effects and non-pharmacologic comfort measures Outcome: Progressing   Problem: Health Behavior/Discharge Planning: Goal: Ability to manage health-related needs will improve Outcome: Progressing   Problem: Clinical Measurements: Goal: Ability to maintain clinical measurements within normal limits will improve Outcome: Progressing Goal: Will remain free from infection Outcome: Progressing Goal: Diagnostic test results will improve Outcome: Progressing Goal: Respiratory complications will improve Outcome: Progressing Goal: Cardiovascular complication will be avoided Outcome: Progressing   Problem: Activity: Goal: Risk for activity intolerance will decrease Outcome: Progressing   Problem: Nutrition: Goal: Adequate nutrition will be maintained Outcome: Progressing   Problem: Coping: Goal: Level of anxiety will decrease Outcome: Progressing   Problem: Elimination: Goal: Will not experience complications related to bowel motility Outcome: Progressing Goal: Will not experience complications related to urinary retention Outcome: Progressing   Problem: Pain Managment: Goal: General experience of comfort will improve Outcome: Progressing   Problem: Skin Integrity: Goal: Risk for impaired skin integrity will decrease Outcome: Progressing   Problem: Education: Goal: Knowledge of vaso-occlusive preventative measures will improve Outcome: Progressing Goal: Awareness of infection prevention will improve Outcome: Progressing Goal: Awareness of signs and symptoms of anemia will improve Outcome: Progressing Goal: Long-term complications will improve Outcome: Progressing   Problem: Self-Care: Goal: Ability to incorporate actions that prevent/reduce pain crisis will improve Outcome: Progressing   Problem: Bowel/Gastric: Goal: Gut  motility will be maintained Outcome: Progressing   Problem: Tissue Perfusion: Goal: Complications related to inadequate tissue perfusion will be avoided or minimized Outcome: Progressing   Problem: Respiratory: Goal: Pulmonary complications will be avoided or minimized Outcome: Progressing Goal: Acute Chest Syndrome will be identified early to prevent complications Outcome: Progressing   Problem: Fluid Volume: Goal: Ability to maintain a balanced intake and output will improve Outcome: Progressing   Problem: Sensory: Goal: Pain level will decrease with appropriate interventions Outcome: Progressing   Problem: Health Behavior: Goal: Postive changes in compliance with treatment and prescription regimens will improve Outcome: Progressing

## 2018-07-06 LAB — CBC
HCT: 25.5 % — ABNORMAL LOW (ref 39.0–52.0)
Hemoglobin: 8.5 g/dL — ABNORMAL LOW (ref 13.0–17.0)
MCH: 40.5 pg — ABNORMAL HIGH (ref 26.0–34.0)
MCHC: 33.3 g/dL (ref 30.0–36.0)
MCV: 121.4 fL — ABNORMAL HIGH (ref 80.0–100.0)
Platelets: 186 10*3/uL (ref 150–400)
RBC: 2.1 MIL/uL — ABNORMAL LOW (ref 4.22–5.81)
RDW: 35.2 % — ABNORMAL HIGH (ref 11.5–15.5)
WBC: 11.5 10*3/uL — ABNORMAL HIGH (ref 4.0–10.5)
nRBC: 13 % — ABNORMAL HIGH (ref 0.0–0.2)

## 2018-07-06 LAB — COMPREHENSIVE METABOLIC PANEL
ALT: 35 U/L (ref 0–44)
AST: 26 U/L (ref 15–41)
Albumin: 2.7 g/dL — ABNORMAL LOW (ref 3.5–5.0)
Alkaline Phosphatase: 182 U/L — ABNORMAL HIGH (ref 38–126)
Anion gap: 5 (ref 5–15)
BUN: 56 mg/dL — ABNORMAL HIGH (ref 6–20)
CO2: 15 mmol/L — ABNORMAL LOW (ref 22–32)
Calcium: 8.3 mg/dL — ABNORMAL LOW (ref 8.9–10.3)
Chloride: 116 mmol/L — ABNORMAL HIGH (ref 98–111)
Creatinine, Ser: 2.36 mg/dL — ABNORMAL HIGH (ref 0.61–1.24)
GFR calc Af Amer: 40 mL/min — ABNORMAL LOW (ref >60)
GFR calc non Af Amer: 34 mL/min — ABNORMAL LOW (ref >60)
Glucose, Bld: 116 mg/dL — ABNORMAL HIGH (ref 70–99)
Potassium: 5.1 mmol/L (ref 3.5–5.1)
Sodium: 136 mmol/L (ref 135–145)
Total Bilirubin: 1.8 mg/dL — ABNORMAL HIGH (ref 0.3–1.2)
Total Protein: 6.1 g/dL — ABNORMAL LOW (ref 6.5–8.1)

## 2018-07-06 MED ORDER — HYDROMORPHONE HCL 1 MG/ML IJ SOLN
1.0000 mg | INTRAMUSCULAR | Status: DC | PRN
  Administered 2018-07-06 (×2): 1 mg via INTRAVENOUS
  Filled 2018-07-06 (×2): qty 1

## 2018-07-06 NOTE — Progress Notes (Signed)
Assumed care of patient at 1500. Agree with previous Nurse assessment. Patient states he is ready to go home. Awaiting results of CBC, will notify MD.  Barbee Shropshire. Brigitte Pulse, RN

## 2018-07-06 NOTE — Progress Notes (Signed)
Reviewed discharge information with patient. Answered all questions. Patient able to teach back medications and reasons to contact MD/911. Patient verbalizes importance of PCP follow up appointment.  Jazsmine Macari M. Trannie Bardales, RN   

## 2018-07-06 NOTE — Discharge Summary (Signed)
Physician Discharge Summary  Patient ID: [name] [name]. MRN: [id number] DOB/AGE: Dec 11, 1980 38 y.o.  Admit date: 07/03/2018 Discharge date: 07/06/2018  Admission Diagnoses:  Discharge Diagnoses:  Principal Problem:   Sickle cell pain crisis ([place]) Active Problems:   Sickle cell anemia with crisis ([place])   Nephrotic syndrome   CKD (chronic kidney disease), stage III ([place])   Acute hyperkalemia   Discharged Condition: good  Hospital Course: Patient is a 38 year old gentleman admitted to the hospital with sickle cell painful crisis.  He also has significant kidney disease with nephrotic syndrome.  Patient has had acute hyperkalemia.  He was treated with Dilaudid PCA.  No Toradol but IV fluids.  Also home regimen.  Patient responded to treatment and was doing much better.  He gradually was transitioned to oral pain medications on home regimen.  He was discharged home to follow-up with PCP.  Patient was much better at time of discharge with no significant complaints.  He is to follow-up with his PCP and continue home regimen.  Consults: None  Significant Diagnostic Studies: labs: Serial CBCs and CMP's were checked.  All are within normal range  Treatments: IV hydration and analgesia: acetaminophen and Dilaudid  Discharge Exam: Blood pressure (!) 133/99, pulse 90, temperature 98 F (36.7 C), temperature source Oral, resp. rate 18, height 5\' 3"  (1.6 m), weight 61.5 kg, SpO2 100 %. General appearance: alert, cooperative, appears stated age and no distress Neck: no adenopathy, no carotid bruit, no JVD, supple, symmetrical, trachea midline and thyroid not enlarged, symmetric, no tenderness/mass/nodules Back: symmetric, no curvature. ROM normal. No CVA tenderness. Resp: clear to auscultation bilaterally Cardio: regular rate and rhythm, S1, S2 normal, no murmur, click, rub or gallop GI: soft, non-tender; bowel sounds normal; no masses,  no organomegaly Extremities: extremities normal,  atraumatic, no cyanosis or edema Pulses: 2+ and symmetric Skin: Skin color, texture, turgor normal. No rashes or lesions Neurologic: Grossly normal  Disposition: Discharge disposition: 01-Home or Self Care       Discharge Instructions    Diet - low sodium heart healthy   Complete by:  As directed    Increase activity slowly   Complete by:  As directed      Allergies as of 07/06/2018      Reactions   Nsaids Other (See Comments)   Kidney disease   Morphine And Related Other (See Comments)   "real bad headaches"      Medication List    TAKE these medications   acetaminophen 500 MG tablet Commonly known as:  TYLENOL Take 1,000 mg by mouth every 6 (six) hours as needed for moderate pain.   [facility] VITAMIN B12 1000 MCG tablet Generic drug:  cyanocobalamin Take 2,000 mcg by mouth daily.   ergocalciferol 1.25 MG (50000 UT) capsule Commonly known as:  Drisdol Take 1 capsule (50,000 Units total) by mouth once a week.   folic acid 1 MG tablet Commonly known as:  FOLVITE Take 1 tablet (1 mg total) daily by mouth.   gabapentin 300 MG capsule Commonly known as:  NEURONTIN Take 300 mg by mouth at bedtime.   hydroxyurea 500 MG capsule Commonly known as:  HYDREA TAKE 3 CAPSULES BY MOUTH DAILY What changed:    how much to take  how to take this  when to take this  additional instructions   nortriptyline 25 MG capsule Commonly known as:  PAMELOR Take 1 capsule (25 mg total) by mouth at bedtime as needed for sleep.   predniSONE  20 MG tablet Commonly known as:  DELTASONE Take 60 mg by mouth daily.   Vitamin D 50 MCG (2000 UT) tablet Take 2,000 Units by mouth 2 (two) times a day.        SignedBarbette Merino 07/06/2018, 4:20 PM   Time spent 33 minutes

## 2018-07-06 NOTE — Progress Notes (Signed)
Lab notified RN that patient refused lab draw again this morning.  Dr. Jonelle Sidle notified via text page.

## 2018-07-06 NOTE — Progress Notes (Signed)
Patient allowed Lab to attempt one time for blood draw.  Then, allowed one finger stick but refused further lab draws.  Unable to obtain BMET per Lab.

## 2018-07-16 DIAGNOSIS — N183 Chronic kidney disease, stage 3 (moderate): Secondary | ICD-10-CM | POA: Diagnosis not present

## 2018-07-16 DIAGNOSIS — N049 Nephrotic syndrome with unspecified morphologic changes: Secondary | ICD-10-CM | POA: Diagnosis not present

## 2018-07-18 DIAGNOSIS — D571 Sickle-cell disease without crisis: Secondary | ICD-10-CM | POA: Diagnosis not present

## 2018-07-21 ENCOUNTER — Emergency Department (HOSPITAL_COMMUNITY): Payer: Medicare Other

## 2018-07-21 ENCOUNTER — Encounter (HOSPITAL_COMMUNITY): Payer: Self-pay

## 2018-07-21 ENCOUNTER — Other Ambulatory Visit: Payer: Self-pay

## 2018-07-21 ENCOUNTER — Inpatient Hospital Stay (HOSPITAL_COMMUNITY)
Admission: EM | Admit: 2018-07-21 | Discharge: 2018-07-24 | DRG: 812 | Disposition: A | Discharge status: Home / Self Care | Payer: Medicare Other | Attending: Internal Medicine | Admitting: Internal Medicine

## 2018-07-21 DIAGNOSIS — K219 Gastro-esophageal reflux disease without esophagitis: Secondary | ICD-10-CM | POA: Diagnosis present

## 2018-07-21 DIAGNOSIS — F17210 Nicotine dependence, cigarettes, uncomplicated: Secondary | ICD-10-CM | POA: Diagnosis present

## 2018-07-21 DIAGNOSIS — Z79899 Other long term (current) drug therapy: Secondary | ICD-10-CM | POA: Diagnosis not present

## 2018-07-21 DIAGNOSIS — G894 Chronic pain syndrome: Secondary | ICD-10-CM | POA: Diagnosis present

## 2018-07-21 DIAGNOSIS — D631 Anemia in chronic kidney disease: Secondary | ICD-10-CM | POA: Diagnosis not present

## 2018-07-21 DIAGNOSIS — N049 Nephrotic syndrome with unspecified morphologic changes: Secondary | ICD-10-CM

## 2018-07-21 DIAGNOSIS — D72829 Elevated white blood cell count, unspecified: Secondary | ICD-10-CM | POA: Diagnosis not present

## 2018-07-21 DIAGNOSIS — Z885 Allergy status to narcotic agent status: Secondary | ICD-10-CM

## 2018-07-21 DIAGNOSIS — R079 Chest pain, unspecified: Secondary | ICD-10-CM | POA: Diagnosis not present

## 2018-07-21 DIAGNOSIS — J9811 Atelectasis: Secondary | ICD-10-CM | POA: Diagnosis not present

## 2018-07-21 DIAGNOSIS — Z803 Family history of malignant neoplasm of breast: Secondary | ICD-10-CM

## 2018-07-21 DIAGNOSIS — N183 Chronic kidney disease, stage 3 unspecified: Secondary | ICD-10-CM | POA: Diagnosis present

## 2018-07-21 DIAGNOSIS — Z888 Allergy status to other drugs, medicaments and biological substances status: Secondary | ICD-10-CM | POA: Diagnosis not present

## 2018-07-21 DIAGNOSIS — D57219 Sickle-cell/Hb-C disease with crisis, unspecified: Secondary | ICD-10-CM | POA: Diagnosis not present

## 2018-07-21 DIAGNOSIS — Z7952 Long term (current) use of systemic steroids: Secondary | ICD-10-CM | POA: Diagnosis not present

## 2018-07-21 DIAGNOSIS — D571 Sickle-cell disease without crisis: Secondary | ICD-10-CM | POA: Diagnosis not present

## 2018-07-21 DIAGNOSIS — Z1159 Encounter for screening for other viral diseases: Secondary | ICD-10-CM

## 2018-07-21 DIAGNOSIS — Z20828 Contact with and (suspected) exposure to other viral communicable diseases: Secondary | ICD-10-CM | POA: Diagnosis not present

## 2018-07-21 DIAGNOSIS — D5700 Hb-SS disease with crisis, unspecified: Principal | ICD-10-CM | POA: Diagnosis present

## 2018-07-21 DIAGNOSIS — Z9049 Acquired absence of other specified parts of digestive tract: Secondary | ICD-10-CM | POA: Diagnosis not present

## 2018-07-21 LAB — COMPREHENSIVE METABOLIC PANEL
ALT: 60 U/L — ABNORMAL HIGH (ref 0–44)
AST: 29 U/L (ref 15–41)
Albumin: 2.9 g/dL — ABNORMAL LOW (ref 3.5–5.0)
Alkaline Phosphatase: 210 U/L — ABNORMAL HIGH (ref 38–126)
Anion gap: 8 (ref 5–15)
BUN: 59 mg/dL — ABNORMAL HIGH (ref 6–20)
CO2: 18 mmol/L — ABNORMAL LOW (ref 22–32)
Calcium: 8.4 mg/dL — ABNORMAL LOW (ref 8.9–10.3)
Chloride: 110 mmol/L (ref 98–111)
Creatinine, Ser: 2.54 mg/dL — ABNORMAL HIGH (ref 0.61–1.24)
GFR calc Af Amer: 36 mL/min — ABNORMAL LOW (ref >60)
GFR calc non Af Amer: 31 mL/min — ABNORMAL LOW (ref >60)
Glucose, Bld: 137 mg/dL — ABNORMAL HIGH (ref 70–99)
Potassium: 4.7 mmol/L (ref 3.5–5.1)
Sodium: 136 mmol/L (ref 135–145)
Total Bilirubin: 2.7 mg/dL — ABNORMAL HIGH (ref 0.3–1.2)
Total Protein: 6 g/dL — ABNORMAL LOW (ref 6.5–8.1)

## 2018-07-21 LAB — CBC WITH DIFFERENTIAL/PLATELET
Abs Immature Granulocytes: 0.11 10*3/uL — ABNORMAL HIGH (ref 0.00–0.07)
Basophils Absolute: 0 10*3/uL (ref 0.0–0.1)
Basophils Relative: 0 %
Eosinophils Absolute: 0 10*3/uL (ref 0.0–0.5)
Eosinophils Relative: 0 %
HCT: 23.5 % — ABNORMAL LOW (ref 39.0–52.0)
Hemoglobin: 7.8 g/dL — ABNORMAL LOW (ref 13.0–17.0)
Immature Granulocytes: 1 %
Lymphocytes Relative: 9 %
Lymphs Abs: 0.9 10*3/uL (ref 0.7–4.0)
MCH: 40.6 pg — ABNORMAL HIGH (ref 26.0–34.0)
MCHC: 33.2 g/dL (ref 30.0–36.0)
MCV: 122.4 fL — ABNORMAL HIGH (ref 80.0–100.0)
Monocytes Absolute: 0.3 10*3/uL (ref 0.1–1.0)
Monocytes Relative: 3 %
Neutro Abs: 9.3 10*3/uL — ABNORMAL HIGH (ref 1.7–7.7)
Neutrophils Relative %: 87 %
Platelets: 180 10*3/uL (ref 150–400)
RBC: 1.92 MIL/uL — ABNORMAL LOW (ref 4.22–5.81)
WBC: 10.6 10*3/uL — ABNORMAL HIGH (ref 4.0–10.5)
nRBC: 5.8 % — ABNORMAL HIGH (ref 0.0–0.2)

## 2018-07-21 LAB — RETICULOCYTES
Immature Retic Fract: 40.2 % — ABNORMAL HIGH (ref 2.3–15.9)
RBC.: 1.92 MIL/uL — ABNORMAL LOW (ref 4.22–5.81)
Retic Count, Absolute: 122.5 10*3/uL (ref 19.0–186.0)
Retic Ct Pct: 6.4 % — ABNORMAL HIGH (ref 0.4–3.1)

## 2018-07-21 LAB — SARS CORONAVIRUS 2 BY RT PCR (HOSPITAL ORDER, PERFORMED IN ~~LOC~~ HOSPITAL LAB): SARS Coronavirus 2: NEGATIVE

## 2018-07-21 MED ORDER — HYDROMORPHONE HCL 1 MG/ML IJ SOLN: 1.0000 mg | INTRAMUSCULAR | Status: AC

## 2018-07-21 MED ORDER — OXYCODONE HCL ER 10 MG PO T12A
10.0000 mg | EXTENDED_RELEASE_TABLET | Freq: Two times a day (BID) | ORAL | Status: DC
  Administered 2018-07-21 – 2018-07-24 (×6): 10 mg via ORAL
  Filled 2018-07-21 (×6): qty 1

## 2018-07-21 MED ORDER — SODIUM CHLORIDE 0.45 % IV BOLUS
250.0000 mL | Freq: Once | INTRAVENOUS | Status: AC
  Administered 2018-07-21: 250 mL via INTRAVENOUS

## 2018-07-21 MED ORDER — HYDROMORPHONE HCL 1 MG/ML IJ SOLN
1.0000 mg | INTRAMUSCULAR | Status: AC
  Administered 2018-07-21: 1 mg via INTRAVENOUS
  Filled 2018-07-21: qty 1

## 2018-07-21 MED ORDER — SODIUM CHLORIDE 0.9 % IV SOLN
INTRAVENOUS | Status: DC
  Administered 2018-07-21: 22:00:00 via INTRAVENOUS

## 2018-07-21 MED ORDER — SODIUM CHLORIDE 0.9% FLUSH: 9.0000 mL | INTRAVENOUS | Status: DC | PRN

## 2018-07-21 MED ORDER — HYDROMORPHONE HCL 1 MG/ML IJ SOLN
1.0000 mg | INTRAMUSCULAR | Status: AC
  Administered 2018-07-21 (×2): 1 mg via INTRAVENOUS
  Filled 2018-07-21 (×2): qty 1

## 2018-07-21 MED ORDER — SODIUM CHLORIDE 0.9% FLUSH
3.0000 mL | Freq: Once | INTRAVENOUS | Status: AC
  Administered 2018-07-21: 3 mL via INTRAVENOUS

## 2018-07-21 MED ORDER — HYDROMORPHONE 1 MG/ML IV SOLN
INTRAVENOUS | Status: DC
  Administered 2018-07-21: 30 mg via INTRAVENOUS
  Administered 2018-07-22: 0.6 mg via INTRAVENOUS
  Administered 2018-07-22: 1.9 mg via INTRAVENOUS
  Filled 2018-07-21: qty 30

## 2018-07-21 MED ORDER — DIPHENHYDRAMINE HCL 50 MG/ML IJ SOLN
25.0000 mg | Freq: Once | INTRAMUSCULAR | Status: AC
  Administered 2018-07-21: 25 mg via INTRAVENOUS
  Filled 2018-07-21: qty 1

## 2018-07-21 MED ORDER — NALOXONE HCL 0.4 MG/ML IJ SOLN: 0.4000 mg | INTRAMUSCULAR | Status: DC | PRN

## 2018-07-21 MED ORDER — HYDROMORPHONE HCL 1 MG/ML IJ SOLN: 0.5000 mg | INTRAMUSCULAR | Status: AC

## 2018-07-21 MED ORDER — HYDROMORPHONE HCL 1 MG/ML IJ SOLN
1.0000 mg | INTRAMUSCULAR | Status: AC
  Administered 2018-07-21: 17:00:00 1 mg via INTRAVENOUS
  Filled 2018-07-21: qty 1

## 2018-07-21 MED ORDER — HYDROMORPHONE HCL 1 MG/ML IJ SOLN
0.5000 mg | INTRAMUSCULAR | Status: AC
  Administered 2018-07-21: 0.5 mg via INTRAVENOUS
  Filled 2018-07-21: qty 1

## 2018-07-21 NOTE — ED Notes (Signed)
Patient transported to X-ray 

## 2018-07-21 NOTE — ED Notes (Signed)
Pt given sandwich and drinks upon request.

## 2018-07-21 NOTE — ED Notes (Signed)
ED TO INPATIENT HANDOFF REPORT  ED Nurse Name and Phone #:   S Name/Age/Gender [name] [name]. 38 y.o. male Room/Bed: WA23/WA23  Code Status   Code Status: Prior  Home/SNF/Other Home Patient oriented to: self, place, time and situation Is this baseline? Yes   Triage Complete: Triage complete  Chief Complaint sickle cell crisis  Triage Note Pt states he woke up this morning experiencing a SCC. Pt states left arm and chest pain.   Allergies Allergies  Allergen Reactions  . Nsaids Other (See Comments)    Kidney disease  . Morphine And Related Other (See Comments)    "real bad headaches"    Level of Care/Admitting Diagnosis ED Disposition    ED Disposition Condition [facility] Area: [place] [[id number]]  Level of Care: Med-Surg [16]  Covid Evaluation: Screening Protocol (No Symptoms)  Diagnosis: Sickle cell pain crisis [facility]) [[id number]]  Admitting Physician: Harvie Bridge [[id number]]  Attending Physician: Sherron Monday  Estimated length of stay: past midnight tomorrow  Certification:: I certify this patient will need inpatient services for at least 2 midnights  PT Class (Do Not Modify): Inpatient [101]  PT Acc Code (Do Not Modify): Private [1]       B Medical/Surgery History Past Medical History:  Diagnosis Date  . Arachnoid Cyst of brain bilaterally    "2 really small ones in the back of my head; inside; saw them w/MRI" (09/25/2012)  . Bacterial pneumonia ~ 2012   "caught it here in the hospital" (09/25/2012)  . Chronic kidney disease    "from my sickle cell" (09/25/2012)  . CKD (chronic kidney disease), stage II   . GERD (gastroesophageal reflux disease)    "after I eat alot of spicey foods" (09/25/2012)  . Gynecomastia, male 07/10/2012  . History of blood transfusion    "always related to sickle cell crisis" (09/25/2012)  . 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   Noted in noted from Hematology  notes at [facility]  . Migraines    "take RX qd to prevent them" (09/25/2012)  . Sickle cell anemia (HCC)   . Sickle cell crisis ([place]) 09/25/2012  . Sickle cell nephropathy ([place]) 07/10/2012  . Sinus tachycardia   . Tachycardia with heart rate 121-140 beats per minute with ambulation 08/04/2016   Past Surgical History:  Procedure Laterality Date  . CHOLECYSTECTOMY  ~ 2012  . COLONOSCOPY N/A 04/23/2017   Procedure: COLONOSCOPY;  Surgeon: Irene Shipper, MD;  Location: [facility];  Service: Endoscopy;  Laterality: N/A;  . IR FLUORO GUIDE CV LINE RIGHT  12/17/2016  . IR REMOVAL TUN CV CATH W/O FL  12/21/2016  . IR US GUIDE VASC ACCESS RIGHT  12/17/2016  . spleenectomy       A IV Location/Drains/Wounds Patient Lines/Drains/Airways Status   Active Line/Drains/Airways    Name:   Placement date:   Placement time:   Site:   Days:   Peripheral IV 07/21/18 Right;Upper Arm   07/21/18    1614    Arm   less than 1   Incision (Closed) 04/25/17 Flank Left   04/25/17    1950     452          Intake/Output Last 24 hours No intake or output data in the 24 hours ending 07/21/18 2028  Labs/Imaging Results for orders placed or performed during the hospital encounter of 07/21/18 (from the past 48 hour(s))  Comprehensive metabolic panel     Status:  Abnormal   Collection Time: 07/21/18  4:20 PM  Result Value Ref Range   Sodium 136 135 - 145 mmol/L   Potassium 4.7 3.5 - 5.1 mmol/L   Chloride 110 98 - 111 mmol/L   CO2 18 (L) 22 - 32 mmol/L   Glucose, Bld 137 (H) 70 - 99 mg/dL   BUN 59 (H) 6 - 20 mg/dL   Creatinine, Ser 2.54 (H) 0.61 - 1.24 mg/dL   Calcium 8.4 (L) 8.9 - 10.3 mg/dL   Total Protein 6.0 (L) 6.5 - 8.1 g/dL   Albumin 2.9 (L) 3.5 - 5.0 g/dL   AST 29 15 - 41 U/L   ALT 60 (H) 0 - 44 U/L   Alkaline Phosphatase 210 (H) 38 - 126 U/L   Total Bilirubin 2.7 (H) 0.3 - 1.2 mg/dL   GFR calc non Af Amer 31 (L) >60 mL/min   GFR calc Af Amer 36 (L) >60 mL/min   Anion gap 8 5 - 15     Comment: Performed at [facility], [place] [address], [place], [place] [address]  CBC with Differential     Status: Abnormal   Collection Time: 07/21/18  4:20 PM  Result Value Ref Range   WBC 10.6 (H) 4.0 - 10.5 K/uL   RBC 1.92 (L) 4.22 - 5.81 MIL/uL   Hemoglobin 7.8 (L) 13.0 - 17.0 g/dL   HCT 23.5 (L) 39.0 - 52.0 %   MCV 122.4 (H) 80.0 - 100.0 fL   MCH 40.6 (H) 26.0 - 34.0 pg   MCHC 33.2 30.0 - 36.0 g/dL   RDW Not Measured 11.5 - 15.5 %   Platelets 180 150 - 400 K/uL   nRBC 5.8 (H) 0.0 - 0.2 %   Neutrophils Relative % 87 %   Neutro Abs 9.3 (H) 1.7 - 7.7 K/uL   Lymphocytes Relative 9 %   Lymphs Abs 0.9 0.7 - 4.0 K/uL   Monocytes Relative 3 %   Monocytes Absolute 0.3 0.1 - 1.0 K/uL   Eosinophils Relative 0 %   Eosinophils Absolute 0.0 0.0 - 0.5 K/uL   Basophils Relative 0 %   Basophils Absolute 0.0 0.0 - 0.1 K/uL   WBC Morphology HYPERSEGMENTED NEUT     Comment: TOXIC GRANULATION   RBC Morphology RARE NRBC PRESENT    Immature Granulocytes 1 %   Abs Immature Granulocytes 0.11 (H) 0.00 - 0.07 K/uL   Tammy Sours Bodies PRESENT    Polychromasia PRESENT    Sickle Cells PRESENT    Target Cells PRESENT    Stomatocytes PRESENT     Comment: Performed at [facility], [place] [address] [place], [place] [address]  Reticulocytes     Status: Abnormal   Collection Time: 07/21/18  4:20 PM  Result Value Ref Range   Retic Ct Pct 6.4 (H) 0.4 - 3.1 %   RBC. 1.92 (L) 4.22 - 5.81 MIL/uL   Retic Count, Absolute 122.5 19.0 - 186.0 K/uL   Immature Retic Fract 40.2 (H) 2.3 - 15.9 %    Comment: Performed at [facility], [place] [address], [place], [place] [address]   Dg Chest 2 View  Result Date: 07/21/2018 CLINICAL DATA:  Chest pain.  Sickle cell disease. EXAM: CHEST - 2 VIEW COMPARISON:  Jul 03, 2018 FINDINGS: No edema or consolidation. Heart size and pulmonary vascularity are normal. No adenopathy. No pneumothorax. There is avascular necrosis  in the right femoral head. There are endplate infarcts in several  vertebral bodies. IMPRESSION: Lungs clear. No adenopathy. Bony changes indicative of known sickle cell disease. Electronically Signed   By: Lowella Grip III M.D.   On: 07/21/2018 16:27    Pending Labs Unresulted Labs (From admission, onward)    Start     Ordered   07/21/18 1903  SARS Coronavirus 2 (CEPHEID - Performed in [place] hospital lab), Hosp Order  (Asymptomatic Patients Labs)  Once,   R    Question:  Rule Out  Answer:  Yes   07/21/18 1902   Signed and Held  CBC  (heparin)  Once,   R    Comments:  Baseline for heparin therapy IF NOT ALREADY DRAWN.  Notify MD if PLT < 100 K.    Signed and Held   Signed and Held  Creatinine, serum  (heparin)  Once,   R    Comments:  Baseline for heparin therapy IF NOT ALREADY DRAWN.    Signed and Held   Signed and Held  Lactate dehydrogenase  Once,   R     Signed and Held   Signed and Held  CBC with Differential/Platelet  Daily,   R     Signed and Held   Signed and Held  Comprehensive metabolic panel  Daily,   R     Signed and Held   Signed and Held  Urine rapid drug screen (hosp performed)  ONCE - STAT,   R     Signed and Held   Signed and Held  Urinalysis, Routine w reflex microscopic  Once,   R     Signed and Held   Signed and Held  CBC  Once-Timed,   R     Signed and Held          Vitals/Pain Today's Vitals   07/21/18 1843 07/21/18 1900 07/21/18 1930 07/21/18 2000  BP:  (!) 119/93 (!) 134/98 (!) 123/92  Pulse:  97 98 99  Resp:  18 18 18   Temp:      TempSrc:      SpO2:  99% 96% 98%  Weight:      Height:      PainSc: 10-Worst pain ever       Isolation Precautions No active isolations  Medications Medications  naloxone (NARCAN) injection 0.4 mg (has no administration in time range)    And  sodium chloride flush (NS) 0.9 % injection 9 mL (has no administration in time range)  HYDROmorphone (DILAUDID) 1 mg/mL PCA injection (has no administration in  time range)  sodium chloride flush (NS) 0.9 % injection 3 mL (3 mLs Intravenous Given 07/21/18 1618)  HYDROmorphone (DILAUDID) injection 0.5 mg (0.5 mg Intravenous Given 07/21/18 1617)    Or  HYDROmorphone (DILAUDID) injection 0.5 mg ( Subcutaneous See Alternative 07/21/18 1617)  HYDROmorphone (DILAUDID) injection 1 mg (1 mg Intravenous Given 07/21/18 1646)    Or  HYDROmorphone (DILAUDID) injection 1 mg ( Subcutaneous See Alternative 07/21/18 1646)  HYDROmorphone (DILAUDID) injection 1 mg (1 mg Intravenous Given 07/21/18 1718)    Or  HYDROmorphone (DILAUDID) injection 1 mg ( Subcutaneous See Alternative 07/21/18 1718)  HYDROmorphone (DILAUDID) injection 1 mg (1 mg Intravenous Given 07/21/18 1926)    Or  HYDROmorphone (DILAUDID) injection 1 mg ( Subcutaneous See Alternative 07/21/18 1926)  diphenhydrAMINE (BENADRYL) injection 25 mg (25 mg Intravenous Given 07/21/18 1617)  sodium chloride 0.45 % bolus 250 mL (0 mLs Intravenous Stopped 07/21/18 1731)  diphenhydrAMINE (BENADRYL) injection 25 mg (25 mg Intravenous Given 07/21/18 1843)  Mobility walks Low fall risk       R Recommendations: See Admitting Provider Note  Report given to:   Additional Notes:

## 2018-07-21 NOTE — H&P (Signed)
History and Physical   [facility] [place] [facility] [place] [facility] Admission History and Physical [employer], D.O.    Patient Name: [name] [name] MR#: [id number] Date of Birth: 1980/05/26 Date of Admission: 07/21/2018  Referring MD/NP/PA: Dr. Freida Busman Primary Care Physician: Mike Gip, FNP  Chief Complaint:  Chief Complaint  Patient presents with  . Sickle Cell Pain Crisis    HPI: [name] [name] is a 38 y.o. male with a known history of sickle cell anemia, CKD/nephrotic syndrome, polysubstance abuse, GERD presents to the emergency department for evaluation of pain crisis.  Patient was in a usual state of health until today when he developed worsening left-sided chest discomfort which traveled around his chest and then localized to his left arm in addition to bilateral lower extremity pain which is chronic.  Pain is refractory to home Tylenol.  He has run out of his pain medication and is in the process of changing his healthcare over to [facility].   Patient denies fevers/chills, weakness, dizziness, shortness of breath, N/V/C/D, abdominal pain, dysuria/frequency, changes in mental status.    Otherwise there has been no change in status. Patient has been taking medication as prescribed and there has been no recent change in medication or diet.  No recent antibiotics.  There has been no recent illness, hospitalizations, travel or sick contacts.    EMS/ED Course: Patient received Dilaudid, Benadryl, normal saline bolus. Medical admission has been requested for further management of sickle cell pain crisis.  Review of Systems:  CONSTITUTIONAL: No fever/chills, fatigue, weakness, weight gain/loss, headache. EYES: No blurry or double vision. ENT: No tinnitus, postnasal drip, redness or soreness of the oropharynx. RESPIRATORY: No cough, dyspnea, wheeze.  No hemoptysis.  CARDIOVASCULAR: Positive chest pain as per HPI.  Negative palpitations, syncope, orthopnea. No lower extremity  edema.  GASTROINTESTINAL: No nausea, vomiting, abdominal pain, diarrhea, constipation.  No hematemesis, melena or hematochezia. GENITOURINARY: No dysuria, frequency, hematuria. ENDOCRINE: No polyuria or nocturia. No heat or cold intolerance. HEMATOLOGY: No anemia, bruising, bleeding. INTEGUMENTARY: No rashes, ulcers, lesions. MUSCULOSKELETAL: Positive left arm and bilateral lower extremity pain as per HPI.  No arthritis, gout. NEUROLOGIC: No numbness, tingling, ataxia, seizure-type activity, weakness. PSYCHIATRIC: No anxiety, depression, insomnia.   Past Medical History:  Diagnosis Date  . Arachnoid Cyst of brain bilaterally    "2 really small ones in the back of my head; inside; saw them w/MRI" (09/25/2012)  . Bacterial pneumonia ~ 2012   "caught it here in the hospital" (09/25/2012)  . Chronic kidney disease    "from my sickle cell" (09/25/2012)  . CKD (chronic kidney disease), stage II   . GERD (gastroesophageal reflux disease)    "after I eat alot of spicey foods" (09/25/2012)  . Gynecomastia, male 07/10/2012  . History of blood transfusion    "always related to sickle cell crisis" (09/25/2012)  . 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   Noted in noted from Hematology notes at [facility]  . Migraines    "take RX qd to prevent them" (09/25/2012)  . Sickle cell anemia (HCC)   . Sickle cell crisis (HCC) 09/25/2012  . Sickle cell nephropathy (HCC) 07/10/2012  . Sinus tachycardia   . Tachycardia with heart rate 121-140 beats per minute with ambulation 08/04/2016    Past Surgical History:  Procedure Laterality Date  . CHOLECYSTECTOMY  ~ 2012  . COLONOSCOPY N/A 04/23/2017   Procedure: COLONOSCOPY;  Surgeon: Hilarie Fredrickson, MD;  Location: [facility];  Service: Endoscopy;  Laterality: N/A;  . IR  FLUORO GUIDE CV LINE RIGHT  12/17/2016  . IR REMOVAL TUN CV CATH W/O FL  12/21/2016  . IR US GUIDE VASC ACCESS RIGHT  12/17/2016  . spleenectomy       reports that he has been smoking  cigarettes. He has been smoking about 0.10 packs per day. He has never used smokeless tobacco. He reports current alcohol use. He reports previous drug use. Drug: Marijuana.  Allergies  Allergen Reactions  . Nsaids Other (See Comments)    Kidney disease  . Morphine And Related Other (See Comments)    "real bad headaches"    Family History  Problem Relation Age of Onset  . Breast cancer Mother     Prior to Admission medications   Medication Sig Start Date End Date Taking? Authorizing Provider  acetaminophen (TYLENOL) 500 MG tablet Take 1,000 mg by mouth every 6 (six) hours as needed for moderate pain.    [provider]  Cholecalciferol (VITAMIN D) 50 MCG (2000 UT) tablet Take 2,000 Units by mouth 2 (two) times a day. 06/21/18   [provider]  CVS VITAMIN B12 1000 MCG tablet Take 2,000 mcg by mouth daily. 06/21/18   [provider]  ergocalciferol (DRISDOL) 1.25 MG (50000 UT) capsule Take 1 capsule (50,000 Units total) by mouth once a week. Patient not taking: Reported on 05/05/2018 04/05/18   Mike Gip, FNP  folic acid (FOLVITE) 1 MG tablet Take 1 tablet (1 mg total) daily by mouth. 12/22/16   Altha Harm, MD  gabapentin (NEURONTIN) 300 MG capsule Take 300 mg by mouth at bedtime. 06/21/18   [provider]  hydroxyurea (HYDREA) 500 MG capsule TAKE 3 CAPSULES BY MOUTH DAILY Patient taking differently: Take 1,500 mg by mouth daily.  06/19/18   Mike Gip, FNP  nortriptyline (PAMELOR) 25 MG capsule Take 1 capsule (25 mg total) by mouth at bedtime as needed for sleep. 06/10/18   Reva Bores, MD  predniSONE (DELTASONE) 20 MG tablet Take 60 mg by mouth daily. 06/26/18   [provider]    Physical Exam: Vitals:   07/21/18 1800 07/21/18 1830 07/21/18 1900 07/21/18 1930  BP: (!) 123/91 (!) 133/95 (!) 119/93 (!) 134/98  Pulse: 95 98 97 98  Resp: 18 18 18 18   Temp:      TempSrc:      SpO2: 98% 99% 99% 96%  Weight:      Height:         GENERAL: 38 y.o.-year-old male patient, well-developed, well-nourished lying in the bed in no acute distress.  Pleasant and cooperative.   HEENT: Head atraumatic, normocephalic. Pupils equal. Mucus membranes moist. NECK: Supple. No JVD. CHEST: Normal breath sounds bilaterally. No wheezing, rales, rhonchi or crackles. No use of accessory muscles of respiration.  No reproducible chest wall tenderness.  CARDIOVASCULAR: S1, S2 normal. No murmurs, rubs, or gallops.  Pulses intact distally.  ABDOMEN: Soft, nondistended, nontender. EXTREMITIES: No pedal edema, cyanosis, or clubbing. No calf tenderness or Homan's sign.  NEUROLOGIC: The patient is alert and oriented x 3. Cranial nerves II through XII are grossly intact with no focal sensorimotor deficit. PSYCHIATRIC:  Normal affect, mood, thought content. SKIN: Warm, dry, and intact without obvious rash, lesion, or ulcer.    Labs on Admission:  CBC: Recent Labs  Lab 07/21/18 1620  WBC 10.6*  NEUTROABS 9.3*  HGB 7.8*  HCT 23.5*  MCV 122.4*  PLT 180   Basic Metabolic Panel: Recent Labs  Lab 07/21/18 1620  NA 136  K 4.7  CL 110  CO2 18*  GLUCOSE 137*  BUN 59*  CREATININE 2.54*  CALCIUM 8.4*   GFR: Estimated Creatinine Clearance: 32.7 mL/min (A) (by C-G formula based on SCr of 2.54 mg/dL (H)). Liver Function Tests: Recent Labs  Lab 07/21/18 1620  AST 29  ALT 60*  ALKPHOS 210*  BILITOT 2.7*  PROT 6.0*  ALBUMIN 2.9*   No results for input(s): LIPASE, AMYLASE in the last 168 hours. No results for input(s): AMMONIA in the last 168 hours. Coagulation Profile: No results for input(s): INR, PROTIME in the last 168 hours. Cardiac Enzymes: No results for input(s): CKTOTAL, CKMB, CKMBINDEX, TROPONINI in the last 168 hours. BNP (last 3 results) No results for input(s): PROBNP in the last 8760 hours. HbA1C: No results for input(s): HGBA1C in the last 72 hours. CBG: No results for input(s): GLUCAP in the last 168  hours. Lipid Profile: No results for input(s): CHOL, HDL, LDLCALC, TRIG, CHOLHDL, LDLDIRECT in the last 72 hours. Thyroid Function Tests: No results for input(s): TSH, T4TOTAL, FREET4, T3FREE, THYROIDAB in the last 72 hours. Anemia Panel: Recent Labs    07/21/18 1620  RETICCTPCT 6.4*   Urine analysis:    Component Value Date/Time   COLORURINE YELLOW 07/03/2018 2221   APPEARANCEUR CLEAR 07/03/2018 2221   LABSPEC 1.009 07/03/2018 2221   PHURINE 7.0 07/03/2018 2221   GLUCOSEU 50 (A) 07/03/2018 2221   HGBUR NEGATIVE 07/03/2018 2221   HGBUR small 03/10/2010 1445   BILIRUBINUR NEGATIVE 07/03/2018 2221   BILIRUBINUR negative 04/25/2018 1013   BILIRUBINUR neg 03/28/2018 0916   KETONESUR NEGATIVE 07/03/2018 2221   PROTEINUR 100 (A) 07/03/2018 2221   UROBILINOGEN 1.0 04/25/2018 1013   UROBILINOGEN 0.2 05/11/2017 1335   NITRITE NEGATIVE 07/03/2018 2221   LEUKOCYTESUR NEGATIVE 07/03/2018 2221   Sepsis Labs: @LABRCNTIP (procalcitonin:4,lacticidven:4) )No results found for this or any previous visit (from the past 240 hour(s)).   Radiological Exams on Admission: Dg Chest 2 View  Result Date: 07/21/2018 CLINICAL DATA:  Chest pain.  Sickle cell disease. EXAM: CHEST - 2 VIEW COMPARISON:  Jul 03, 2018 FINDINGS: No edema or consolidation. Heart size and pulmonary vascularity are normal. No adenopathy. No pneumothorax. 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 There are endplate infarcts in several vertebral bodies. IMPRESSION: Lungs clear. No adenopathy. Bony changes indicative of known sickle cell disease. Electronically Signed   By: Bretta Bang III M.D.   On: 07/21/2018 16:27    EKG: Normal sinus rhythm at 80 bpm with normal axis and nonspecific ST-T wave changes.   Assessment/Plan  This is a 38 y.o. male with a history of sickle cell anemia, nephrotic syndrome/CKD 3 now being admitted with:  #.  Acute sickle cell pain crisis -Admit to inpatient -IV fluids - IV pain  control, resume home oxycontin and add Dilaudid PCA.   #.  CKD 3, chronic and stable -Monitor BMP  #.  Anemia, chronic and stable, baseline HgB is 7.5-8 - Monitor CBC  #. History of polysubstance abuse, denies current use Urine drug screen pending  Admission status: Inpatient IV Fluids: Normal saline Diet/Nutrition: Regular Consults called: Sickle cell team DVT Px: Heparin, SCDs and early ambulation. Code Status: Full Code  Disposition Plan: To home in 2-3 days  All the records are reviewed and case discussed with ED provider. Management plans discussed with the patient and/or family who express understanding and agree with plan of care.  Lillie Portner D.O. on 07/21/2018 at 7:35  PM CC: Primary care physician; Mike Gip, FNP   07/21/2018, 7:35 PM
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
## 2018-07-21 NOTE — ED Triage Notes (Signed)
Pt states he woke up this morning experiencing a SCC. Pt states left arm and chest pain.

## 2018-07-22 ENCOUNTER — Inpatient Hospital Stay (HOSPITAL_COMMUNITY): Payer: Medicare Other

## 2018-07-22 DIAGNOSIS — D5700 Hb-SS disease with crisis, unspecified: Principal | ICD-10-CM

## 2018-07-22 DIAGNOSIS — R079 Chest pain, unspecified: Secondary | ICD-10-CM

## 2018-07-22 DIAGNOSIS — D72829 Elevated white blood cell count, unspecified: Secondary | ICD-10-CM

## 2018-07-22 LAB — CBC WITH DIFFERENTIAL/PLATELET
Abs Immature Granulocytes: 0.08 10*3/uL — ABNORMAL HIGH (ref 0.00–0.07)
Basophils Absolute: 0 10*3/uL (ref 0.0–0.1)
Basophils Relative: 0 %
Eosinophils Absolute: 0 10*3/uL (ref 0.0–0.5)
Eosinophils Relative: 0 %
HCT: 20.4 % — ABNORMAL LOW (ref 39.0–52.0)
Hemoglobin: 6.8 g/dL — CL (ref 13.0–17.0)
Immature Granulocytes: 1 %
Lymphocytes Relative: 36 %
Lymphs Abs: 5.5 10*3/uL — ABNORMAL HIGH (ref 0.7–4.0)
MCH: 41.2 pg — ABNORMAL HIGH (ref 26.0–34.0)
MCHC: 33.3 g/dL (ref 30.0–36.0)
MCV: 123.6 fL — ABNORMAL HIGH (ref 80.0–100.0)
Monocytes Absolute: 0.5 10*3/uL (ref 0.1–1.0)
Monocytes Relative: 3 %
Neutro Abs: 9 10*3/uL — ABNORMAL HIGH (ref 1.7–7.7)
Neutrophils Relative %: 60 %
Platelets: 159 10*3/uL (ref 150–400)
RBC: 1.65 MIL/uL — ABNORMAL LOW (ref 4.22–5.81)
RDW: 31.1 % — ABNORMAL HIGH (ref 11.5–15.5)
WBC: 15.1 10*3/uL — ABNORMAL HIGH (ref 4.0–10.5)
nRBC: 4.4 % — ABNORMAL HIGH (ref 0.0–0.2)

## 2018-07-22 LAB — COMPREHENSIVE METABOLIC PANEL
ALT: 48 U/L — ABNORMAL HIGH (ref 0–44)
AST: 27 U/L (ref 15–41)
Albumin: 2.5 g/dL — ABNORMAL LOW (ref 3.5–5.0)
Alkaline Phosphatase: 167 U/L — ABNORMAL HIGH (ref 38–126)
Anion gap: 8 (ref 5–15)
BUN: 52 mg/dL — ABNORMAL HIGH (ref 6–20)
CO2: 16 mmol/L — ABNORMAL LOW (ref 22–32)
Calcium: 8.3 mg/dL — ABNORMAL LOW (ref 8.9–10.3)
Chloride: 111 mmol/L (ref 98–111)
Creatinine, Ser: 2.62 mg/dL — ABNORMAL HIGH (ref 0.61–1.24)
GFR calc Af Amer: 35 mL/min — ABNORMAL LOW (ref >60)
GFR calc non Af Amer: 30 mL/min — ABNORMAL LOW (ref >60)
Glucose, Bld: 133 mg/dL — ABNORMAL HIGH (ref 70–99)
Potassium: 4.1 mmol/L (ref 3.5–5.1)
Sodium: 135 mmol/L (ref 135–145)
Total Bilirubin: 2.5 mg/dL — ABNORMAL HIGH (ref 0.3–1.2)
Total Protein: 5.2 g/dL — ABNORMAL LOW (ref 6.5–8.1)

## 2018-07-22 LAB — LACTATE DEHYDROGENASE: LDH: 257 U/L — ABNORMAL HIGH (ref 98–192)

## 2018-07-22 MED ORDER — DIPHENHYDRAMINE HCL 25 MG PO CAPS
25.0000 mg | ORAL_CAPSULE | ORAL | Status: DC | PRN
  Administered 2018-07-22 – 2018-07-23 (×4): 25 mg via ORAL
  Filled 2018-07-22 (×4): qty 1

## 2018-07-22 MED ORDER — VITAMIN D 25 MCG (1000 UNIT) PO TABS
2000.0000 [IU] | ORAL_TABLET | Freq: Two times a day (BID) | ORAL | Status: DC
  Administered 2018-07-22 – 2018-07-24 (×5): 2000 [IU] via ORAL
  Filled 2018-07-22 (×5): qty 2

## 2018-07-22 MED ORDER — HEPARIN SODIUM (PORCINE) 5000 UNIT/ML IJ SOLN
5000.0000 [IU] | Freq: Three times a day (TID) | INTRAMUSCULAR | Status: DC
  Administered 2018-07-22 – 2018-07-24 (×6): 5000 [IU] via SUBCUTANEOUS
  Filled 2018-07-22 (×6): qty 1

## 2018-07-22 MED ORDER — SODIUM CHLORIDE 0.9% FLUSH: 9.0000 mL | INTRAVENOUS | Status: DC | PRN

## 2018-07-22 MED ORDER — FOLIC ACID 1 MG PO TABS
1.0000 mg | ORAL_TABLET | Freq: Every day | ORAL | Status: DC
  Administered 2018-07-22 – 2018-07-24 (×3): 1 mg via ORAL
  Filled 2018-07-22 (×3): qty 1

## 2018-07-22 MED ORDER — NALOXONE HCL 0.4 MG/ML IJ SOLN: 0.4000 mg | INTRAMUSCULAR | Status: DC | PRN

## 2018-07-22 MED ORDER — HYDROMORPHONE 1 MG/ML IV SOLN
INTRAVENOUS | Status: DC
  Administered 2018-07-22: 0.5 mg via INTRAVENOUS
  Administered 2018-07-22: 5.4 mg via INTRAVENOUS
  Administered 2018-07-23: 30 mg via INTRAVENOUS
  Administered 2018-07-24: 6.5 mg via INTRAVENOUS
  Filled 2018-07-22: qty 30

## 2018-07-22 MED ORDER — GABAPENTIN 300 MG PO CAPS: 300.0000 mg | ORAL_CAPSULE | Freq: Every day | ORAL | Status: DC

## 2018-07-22 MED ORDER — TRAMADOL HCL 50 MG PO TABS: 50.0000 mg | ORAL_TABLET | Freq: Once | ORAL | Status: DC

## 2018-07-22 MED ORDER — SODIUM CHLORIDE 0.9% FLUSH
10.0000 mL | INTRAVENOUS | Status: DC | PRN
  Administered 2018-07-24: 10 mL
  Filled 2018-07-22: qty 40

## 2018-07-22 MED ORDER — PREDNISONE 50 MG PO TABS
60.0000 mg | ORAL_TABLET | Freq: Every day | ORAL | Status: DC
  Administered 2018-07-22 – 2018-07-24 (×3): 60 mg via ORAL
  Filled 2018-07-22 (×3): qty 1

## 2018-07-22 MED ORDER — SODIUM CHLORIDE 0.9 % IV SOLN
25.0000 mg | INTRAVENOUS | Status: DC | PRN
  Filled 2018-07-22: qty 0.5

## 2018-07-22 MED ORDER — HYDROXYUREA 500 MG PO CAPS
1500.0000 mg | ORAL_CAPSULE | Freq: Every day | ORAL | Status: DC
  Administered 2018-07-23 – 2018-07-24 (×2): 1500 mg via ORAL
  Filled 2018-07-22 (×3): qty 3

## 2018-07-22 MED ORDER — SODIUM CHLORIDE 0.45 % IV SOLN
INTRAVENOUS | Status: DC
  Administered 2018-07-22 – 2018-07-24 (×3): via INTRAVENOUS

## 2018-07-22 MED ORDER — OXYCODONE HCL 5 MG PO TABS
5.0000 mg | ORAL_TABLET | ORAL | Status: DC | PRN
  Administered 2018-07-22 (×2): 5 mg via ORAL
  Filled 2018-07-22 (×2): qty 1

## 2018-07-22 MED ORDER — POLYETHYLENE GLYCOL 3350 17 G PO PACK: 17.0000 g | PACK | Freq: Every day | ORAL | Status: DC | PRN

## 2018-07-22 MED ORDER — ALUM & MAG HYDROXIDE-SIMETH 200-200-20 MG/5ML PO SUSP: 15.0000 mL | ORAL | Status: DC | PRN

## 2018-07-22 MED ORDER — SODIUM CHLORIDE 0.9% FLUSH
10.0000 mL | Freq: Two times a day (BID) | INTRAVENOUS | Status: DC
  Administered 2018-07-22: 10 mL

## 2018-07-22 MED ORDER — VITAMIN B-12 1000 MCG PO TABS
2000.0000 ug | ORAL_TABLET | Freq: Every day | ORAL | Status: DC
  Administered 2018-07-22 – 2018-07-24 (×3): 2000 ug via ORAL
  Filled 2018-07-22 (×3): qty 2

## 2018-07-22 MED ORDER — SENNOSIDES-DOCUSATE SODIUM 8.6-50 MG PO TABS
1.0000 | ORAL_TABLET | Freq: Two times a day (BID) | ORAL | Status: DC
  Administered 2018-07-23: 1 via ORAL
  Filled 2018-07-22 (×3): qty 1

## 2018-07-22 NOTE — Progress Notes (Signed)
Subjective:  [name] [name], a 38 year old male with a medical history significant for sickle cell disease, type SS, CKD/nephrotic syndrome, history of polysubstance abuse, and history of tobacco dependence was admitted for sickle cell pain crisis.  Patient presented with left-sided chest discomfort left arm, and bilateral lower extremity pain.  Pain is characterized as 9/10, constant, and throbbing.  Patient has been out of pain medications and is no longer prescribed opiates by PCP due to recent history of polysubstance abuse.  He is complaining that current pain medication regimen has been ineffective for pain management.  He says that he is unable to get comfortable and feels "miserable".  Patient's chest x-ray showed no acute cardiopulmonary process.  Patient remains afebrile and maintaining oxygen saturation at 100% on RA.  Patient's creatinine level is 2.6, which is above baseline.  Patient is followed by outpatient nephrology for nephrotic syndrome.  He states that he has been lost to follow-up over the past several months. Objective:  Vital signs in last 24 hours:  Vitals:   07/22/18 0139 07/22/18 0415 07/22/18 0421 07/22/18 0926  BP:  118/89  127/88  Pulse:  (!) 104  (!) 110  Resp: 13 15 16 20   Temp:  97.8 F (36.6 C)  98 F (36.7 C)  TempSrc:  Oral  Oral  SpO2: 100% 100% 100% 100%  Weight:      Height:        Intake/Output from previous day:   Intake/Output Summary (Last 24 hours) at 07/22/2018 1148 Last data filed at 07/22/2018 0926 Gross per 24 hour  Intake 535.65 ml  Output 875 ml  Net -339.35 ml    Physical Exam: General: Alert, awake, oriented x3, in no acute distress.  HEENT: [place]/AT PEERL, EOMI Neck: Trachea midline,  no masses, no thyromegal,y no JVD, no carotid bruit OROPHARYNX:  Moist, No exudate/ erythema/lesions.  Heart: Regular rate and rhythm, without murmurs, rubs, gallops, PMI non-displaced, no heaves or thrills on palpation.  Lungs: Clear to  auscultation, no wheezing or rhonchi noted. No increased vocal fremitus resonant to percussion  Abdomen: Soft, nontender, nondistended, positive bowel sounds, no masses no hepatosplenomegaly noted..  Neuro: No focal neurological deficits noted cranial nerves II through XII grossly intact. DTRs 2+ bilaterally upper and lower extremities. Strength 5 out of 5 in bilateral upper and lower extremities. Musculoskeletal: No warm swelling or erythema around joints, no spinal tenderness noted. Psychiatric: Patient alert and oriented x3, good insight and cognition, good recent to remote recall. Lymph node survey: No cervical axillary or inguinal lymphadenopathy noted.  Lab Results:  Basic Metabolic Panel:    Component Value Date/Time   NA 135 07/22/2018 0535   NA 138 06/13/2017 1102   K 4.1 07/22/2018 0535   CL 111 07/22/2018 0535   CO2 16 (L) 07/22/2018 0535   BUN 52 (H) 07/22/2018 0535   BUN 18 06/13/2017 1102   CREATININE 2.62 (H) 07/22/2018 0535   CREATININE 1.45 (H) 05/12/2014 1430   GLUCOSE 133 (H) 07/22/2018 0535   CALCIUM 8.3 (L) 07/22/2018 0535   CBC:    Component Value Date/Time   WBC 15.1 (H) 07/22/2018 0535   HGB 6.8 (LL) 07/22/2018 0535   HGB 7.8 (L) 03/28/2018 0942   HCT 20.4 (L) 07/22/2018 0535   HCT 22.8 (L) 03/28/2018 0942   PLT 159 07/22/2018 0535   PLT 267 03/28/2018 0942   MCV 123.6 (H) 07/22/2018 0535   MCV 115 (H) 03/28/2018 0942   NEUTROABS 9.0 (H) 07/22/2018 0535  NEUTROABS 2.2 03/28/2018 0942   LYMPHSABS 5.5 (H) 07/22/2018 0535   LYMPHSABS 3.3 (H) 03/28/2018 0942   MONOABS 0.5 07/22/2018 0535   EOSABS 0.0 07/22/2018 0535   EOSABS 0.1 03/28/2018 0942   BASOSABS 0.0 07/22/2018 0535   BASOSABS 0.0 03/28/2018 0942    Recent Results (from the past 240 hour(s))  SARS Coronavirus 2 (CEPHEID - Performed in [facility] hospital lab), Hosp Order     Status: None   Collection Time: 07/21/18  7:03 PM  Result Value Ref Range Status   SARS Coronavirus 2 NEGATIVE  NEGATIVE Final    Comment: (NOTE) If result is NEGATIVE SARS-CoV-2 target nucleic acids are NOT DETECTED. The SARS-CoV-2 RNA is generally detectable in upper and lower  respiratory specimens during the acute phase of infection. The lowest  concentration of SARS-CoV-2 viral copies this assay can detect is 250  copies / mL. A negative result does not preclude SARS-CoV-2 infection  and should not be used as the sole basis for treatment or other  patient management decisions.  A negative result may occur with  improper specimen collection / handling, submission of specimen other  than nasopharyngeal swab, presence of viral mutation(s) within the  areas targeted by this assay, and inadequate number of viral copies  (<250 copies / mL). A negative result must be combined with clinical  observations, patient history, and epidemiological information. If result is POSITIVE SARS-CoV-2 target nucleic acids are DETECTED. The SARS-CoV-2 RNA is generally detectable in upper and lower  respiratory specimens dur ing the acute phase of infection.  Positive  results are indicative of active infection with SARS-CoV-2.  Clinical  correlation with patient history and other diagnostic information is  necessary to determine patient infection status.  Positive results do  not rule out bacterial infection or co-infection with other viruses. If result is PRESUMPTIVE POSTIVE SARS-CoV-2 nucleic acids MAY BE PRESENT.   A presumptive positive result was obtained on the submitted specimen  and confirmed on repeat testing.  While 2019 novel coronavirus  (SARS-CoV-2) nucleic acids may be present in the submitted sample  additional confirmatory testing may be necessary for epidemiological  and / or clinical management purposes  to differentiate between  SARS-CoV-2 and other Sarbecovirus currently known to infect humans.  If clinically indicated additional testing with an alternate test  methodology [phone]) is  advised. The SARS-CoV-2 RNA is generally  detectable in upper and lower respiratory sp ecimens during the acute  phase of infection. The expected result is Negative. Fact Sheet for Patients:  StrictlyIdeas.no Fact Sheet for Healthcare Providers: BankingDealers.co.za This test is not yet approved or cleared by the [place] FDA and has been authorized for detection and/or diagnosis of SARS-CoV-2 by FDA under an Emergency Use Authorization (EUA).  This EUA will remain in effect (meaning this test can be used) for the duration of the COVID-19 declaration under Section 564(b)(1) of the Act, 21 U.S.C. section 360bbb-3(b)(1), unless the authorization is terminated or revoked sooner. Performed at [facility], [place] [address] [place], [place] [address]     Studies/Results: Dg Chest 2 View  Result Date: 07/21/2018 CLINICAL DATA:  Chest pain.  Sickle cell disease. EXAM: CHEST - 2 VIEW COMPARISON:  Jul 03, 2018 FINDINGS: No edema or consolidation. Heart size and pulmonary vascularity are normal. No adenopathy. No pneumothorax. 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 There are endplate infarcts in several vertebral bodies. IMPRESSION: Lungs clear. No adenopathy. Bony changes indicative of known  sickle cell disease. Electronically Signed   By: Lowella Grip III M.D.   On: 07/21/2018 16:27    Medications: Scheduled Meds: . cholecalciferol  2,000 Units Oral BID  . folic acid  1 mg Oral Daily  . heparin  5,000 Units Subcutaneous Q8H  . HYDROmorphone   Intravenous Q4H  . hydroxyurea  1,500 mg Oral Daily  . oxyCODONE  10 mg Oral Q12H  . predniSONE  60 mg Oral Daily  . senna-docusate  1 tablet Oral BID  . sodium chloride flush  10-40 mL Intracatheter Q12H  . cyanocobalamin  2,000 mcg Oral Daily   Continuous Infusions: . sodium chloride    . diphenhydrAMINE     PRN Meds:.alum & mag hydroxide-simeth,  diphenhydrAMINE **OR** diphenhydrAMINE, naloxone **AND** sodium chloride flush, naloxone **AND** [DISCONTINUED] sodium chloride flush, polyethylene glycol, sodium chloride flush  Assessment/Plan: Active Problems:   Sickle cell pain crisis (HCC)   Leukocytosis   Nephrotic syndrome   Chronic pain syndrome   CKD (chronic kidney disease), stage III (HCC)  Sickle cell disease with pain crisis: 0.45% saline at 50 mL/h Hold IV Toradol due to history of nephrotic syndrome. Patient was on custom dose PCA at 1.25 mg/h.  Pain not controlled, so will transition to opiate tolerant PCA per weight-based protocol with settings of 0.5 mg, 10-minute lockout, and 3 mg/h. Oxycodone IR 5 mg every 4 hours as needed for moderate to severe breakthrough pain Maintain oxygen saturation above 90% supplemental oxygen as needed Monitor vital signs closely Reevaluate pain scale regularly  Chronic kidney disease with nephrotic syndrome related to sickle cell disease: Chronic and stable. Follow BMP in a.m. Creatinine 2.6, baseline is 2.2-2.3.  He is followed by outpatient nephrology. Avoid all nephrotoxic medications. Continue gentle hydration  Anemia of chronic disease: Hemoglobin 6.8, which is slightly below baseline.  Baseline is 7.0-8.0 g/dL. Patient does not meet threshold for transfusion at this time.  Continue to follow CBC. Repeat in a.m.  History of polysubstance abuse: Urine drug screen pending.  Will review results as they become available.   Code Status: Full Code Family Communication: N/A Disposition Plan: Not yet ready for discharge  [place], MSN, FNP-C Patient [place] [address] [place], [place] [address] [phone]  If 5PM-7AM, please contact night-coverage.  07/22/2018, 11:48 AM  LOS: 1 day

## 2018-07-22 NOTE — Progress Notes (Signed)
CRITICAL VALUE ALERT  Critical Value:  Hgb 6.8  Date & Time Notied:  07/22/2018 at 0648  Provider Notified: Internal Medicine   Orders Received/Actions taken: waiting orders, Day shift nurse, Festus Holts informed

## 2018-07-22 NOTE — Progress Notes (Signed)
The patient has been reminded numerous times during the shift that a urine specimen is needed. New urinal and specimen container have been provided for the patient and when asked about the specimen the patient responds "on my bad". Nurse and nurse tech have reiterated the need for a urine specimen and patient says that he will get it.

## 2018-07-22 NOTE — Progress Notes (Signed)
[place] Hollis NP called and made aware of patients request to have another MD. There is no other MD to see the patient. Patient has been informed.

## 2018-07-22 NOTE — Progress Notes (Signed)
Patient called nurse into room asking nurse to call MD for more pain medication. [place] Hollis notified of patients request and states that PCA orders will remain the same UDS needs to be collected and oxycodone will be ordered. Patient made aware and states "that's not going to do me any good , I want to be discharged."

## 2018-07-22 NOTE — Progress Notes (Signed)
[place] Hollis NP called and made aware of patients request to be discharged if he is not going to receive more IV pain medication. [place] Hollis NP informs nurse that patient is not medically ready for discharge and that if he leaves it will be against medical advice. [place] Hollis NP states that she is willing to discuss the patients needs when she rounds and see's the patient but will not discharge patient. Patient made aware and ask the nurse why did you call [place]? "I don't get along with her and I don't want her. Can I have another doctor?" The patient begins to say that no one has told him anything about what's going on with him. The nurse again informed the patient that [place] Hollis NP is willing to discuss his plan of care with him when she arrives to the unit.

## 2018-07-23 DIAGNOSIS — N049 Nephrotic syndrome with unspecified morphologic changes: Secondary | ICD-10-CM

## 2018-07-23 LAB — URINALYSIS, ROUTINE W REFLEX MICROSCOPIC
Bacteria, UA: NONE SEEN
Bilirubin Urine: NEGATIVE
Glucose, UA: 150 mg/dL — AB
Hgb urine dipstick: NEGATIVE
Ketones, ur: NEGATIVE mg/dL
Leukocytes,Ua: NEGATIVE
Nitrite: NEGATIVE
Protein, ur: >=300 mg/dL — AB
Specific Gravity, Urine: 1.01 (ref 1.005–1.030)
pH: 7 (ref 5.0–8.0)

## 2018-07-23 LAB — CBC WITH DIFFERENTIAL/PLATELET
Abs Immature Granulocytes: 0.08 10*3/uL — ABNORMAL HIGH (ref 0.00–0.07)
Basophils Absolute: 0 10*3/uL (ref 0.0–0.1)
Basophils Relative: 0 %
Eosinophils Absolute: 0 10*3/uL (ref 0.0–0.5)
Eosinophils Relative: 0 %
HCT: 18.9 % — ABNORMAL LOW (ref 39.0–52.0)
Hemoglobin: 6.3 g/dL — CL (ref 13.0–17.0)
Immature Granulocytes: 1 %
Lymphocytes Relative: 22 %
Lymphs Abs: 2.4 10*3/uL (ref 0.7–4.0)
MCH: 40.4 pg — ABNORMAL HIGH (ref 26.0–34.0)
MCHC: 33.3 g/dL (ref 30.0–36.0)
MCV: 121.2 fL — ABNORMAL HIGH (ref 80.0–100.0)
Monocytes Absolute: 0.7 10*3/uL (ref 0.1–1.0)
Monocytes Relative: 6 %
Neutro Abs: 7.9 10*3/uL — ABNORMAL HIGH (ref 1.7–7.7)
Neutrophils Relative %: 71 %
Platelets: 167 10*3/uL (ref 150–400)
RBC: 1.56 MIL/uL — ABNORMAL LOW (ref 4.22–5.81)
RDW: 31.3 % — ABNORMAL HIGH (ref 11.5–15.5)
WBC: 11.1 10*3/uL — ABNORMAL HIGH (ref 4.0–10.5)
nRBC: 5.5 % — ABNORMAL HIGH (ref 0.0–0.2)

## 2018-07-23 LAB — HEMOGLOBIN AND HEMATOCRIT, BLOOD
HCT: 23.4 % — ABNORMAL LOW (ref 39.0–52.0)
Hemoglobin: 7.8 g/dL — ABNORMAL LOW (ref 13.0–17.0)

## 2018-07-23 LAB — COMPREHENSIVE METABOLIC PANEL
ALT: 43 U/L (ref 0–44)
AST: 22 U/L (ref 15–41)
Albumin: 2.6 g/dL — ABNORMAL LOW (ref 3.5–5.0)
Alkaline Phosphatase: 156 U/L — ABNORMAL HIGH (ref 38–126)
Anion gap: 5 (ref 5–15)
BUN: 55 mg/dL — ABNORMAL HIGH (ref 6–20)
CO2: 19 mmol/L — ABNORMAL LOW (ref 22–32)
Calcium: 8.1 mg/dL — ABNORMAL LOW (ref 8.9–10.3)
Chloride: 111 mmol/L (ref 98–111)
Creatinine, Ser: 2.51 mg/dL — ABNORMAL HIGH (ref 0.61–1.24)
GFR calc Af Amer: 37 mL/min — ABNORMAL LOW (ref >60)
GFR calc non Af Amer: 32 mL/min — ABNORMAL LOW (ref >60)
Glucose, Bld: 105 mg/dL — ABNORMAL HIGH (ref 70–99)
Potassium: 4.4 mmol/L (ref 3.5–5.1)
Sodium: 135 mmol/L (ref 135–145)
Total Bilirubin: 1.8 mg/dL — ABNORMAL HIGH (ref 0.3–1.2)
Total Protein: 5.3 g/dL — ABNORMAL LOW (ref 6.5–8.1)

## 2018-07-23 LAB — RAPID URINE DRUG SCREEN, HOSP PERFORMED
Amphetamines: NOT DETECTED
Barbiturates: NOT DETECTED
Benzodiazepines: NOT DETECTED
Cocaine: NOT DETECTED
Opiates: POSITIVE — AB
Tetrahydrocannabinol: NOT DETECTED

## 2018-07-23 LAB — PREPARE RBC (CROSSMATCH)

## 2018-07-23 MED ORDER — SODIUM CHLORIDE 0.9% IV SOLUTION
Freq: Once | INTRAVENOUS | Status: AC
  Administered 2018-07-23: 11:00:00 via INTRAVENOUS

## 2018-07-23 MED ORDER — DIPHENHYDRAMINE HCL 50 MG/ML IJ SOLN
25.0000 mg | Freq: Once | INTRAMUSCULAR | Status: AC
  Administered 2018-07-23: 25 mg via INTRAVENOUS
  Filled 2018-07-23: qty 1

## 2018-07-23 MED ORDER — ACETAMINOPHEN 325 MG PO TABS
650.0000 mg | ORAL_TABLET | Freq: Once | ORAL | Status: AC
  Administered 2018-07-23: 650 mg via ORAL
  Filled 2018-07-23: qty 2

## 2018-07-23 NOTE — Progress Notes (Signed)
CRITICAL VALUE ALERT  Critical Value: hgb 6.3 Date & Time Notied:  6/9 0630 Provider Notified:yes  Orders Received/Actions taken: pending

## 2018-07-23 NOTE — Progress Notes (Signed)
Subjective: [name] [name], a 38 year old male with a medical history significant for sickle cell disease, type SS, CKD/nephrotic syndrome, history of polysubstance abuse, history of tobacco dependence was admitted for sickle cell pain crisis.  Patient continues to complain of bilateral lower extremity pain.  Current pain intensity 7/10.  Patient is goal is less than 5/10.  Hemoglobin is 6.3, which is decreased with patient's baseline.  Patient's baseline ranges between 7.0-8.0 g/dL.  Patient afebrile and maintaining oxygen saturation at 99% on RA.  Patient endorses periodic dizziness when transitioning from sitting to standing.  He denies headache, shortness of breath, persistent cough, chest pain, nausea, vomiting, or diarrhea.  Objective:  Vital signs in last 24 hours:  Vitals:   07/23/18 0730 07/23/18 0821 07/23/18 1038 07/23/18 1105  BP:  (!) 134/98 133/90 111/89  Pulse:  (!) 109 (!) 110 (!) 105  Resp: 16 14 20 20   Temp:  98 F (36.7 C) 98 F (36.7 C) 98 F (36.7 C)  TempSrc:  Oral Oral Oral  SpO2: 100% 96% 99% 99%  Weight:      Height:        Intake/Output from previous day:   Intake/Output Summary (Last 24 hours) at 07/23/2018 1132 Last data filed at 07/23/2018 1038 Gross per 24 hour  Intake 1913.39 ml  Output 400 ml  Net 1513.39 ml    Physical Exam: General: Alert, awake, oriented x3, in no acute distress.  HEENT: [place]/AT PEERL, EOMI Neck: Trachea midline,  no masses, no thyromegal,y no JVD, no carotid bruit OROPHARYNX:  Moist, No exudate/ erythema/lesions.  Heart: Regular rate and rhythm, without murmurs, rubs, gallops, PMI non-displaced, no heaves or thrills on palpation.  Lungs: Clear to auscultation, no wheezing or rhonchi noted. No increased vocal fremitus resonant to percussion  Abdomen: Soft, nontender, nondistended, positive bowel sounds, no masses no hepatosplenomegaly noted..  Neuro: No focal neurological deficits noted cranial nerves II through XII  grossly intact. DTRs 2+ bilaterally upper and lower extremities. Strength 5 out of 5 in bilateral upper and lower extremities. Musculoskeletal: No warm swelling or erythema around joints, no spinal tenderness noted. Psychiatric: Patient alert and oriented x3, good insight and cognition, good recent to remote recall. Lymph node survey: No cervical axillary or inguinal lymphadenopathy noted.  Lab Results:  Basic Metabolic Panel:    Component Value Date/Time   NA 135 07/23/2018 0516   NA 138 06/13/2017 1102   K 4.4 07/23/2018 0516   CL 111 07/23/2018 0516   CO2 19 (L) 07/23/2018 0516   BUN 55 (H) 07/23/2018 0516   BUN 18 06/13/2017 1102   CREATININE 2.51 (H) 07/23/2018 0516   CREATININE 1.45 (H) 05/12/2014 1430   GLUCOSE 105 (H) 07/23/2018 0516   CALCIUM 8.1 (L) 07/23/2018 0516   CBC:    Component Value Date/Time   WBC 11.1 (H) 07/23/2018 0516   HGB 6.3 (LL) 07/23/2018 0516   HGB 7.8 (L) 03/28/2018 0942   HCT 18.9 (L) 07/23/2018 0516   HCT 22.8 (L) 03/28/2018 0942   PLT 167 07/23/2018 0516   PLT 267 03/28/2018 0942   MCV 121.2 (H) 07/23/2018 0516   MCV 115 (H) 03/28/2018 0942   NEUTROABS 7.9 (H) 07/23/2018 0516   NEUTROABS 2.2 03/28/2018 0942   LYMPHSABS 2.4 07/23/2018 0516   LYMPHSABS 3.3 (H) 03/28/2018 0942   MONOABS 0.7 07/23/2018 0516   EOSABS 0.0 07/23/2018 0516   EOSABS 0.1 03/28/2018 0942   BASOSABS 0.0 07/23/2018 0516   BASOSABS 0.0 03/28/2018 9678  Recent Results (from the past 240 hour(s))  SARS Coronavirus 2 (CEPHEID - Performed in [place] hospital lab), Hosp Order     Status: None   Collection Time: 07/21/18  7:03 PM  Result Value Ref Range Status   SARS Coronavirus 2 NEGATIVE NEGATIVE Final    Comment: (NOTE) If result is NEGATIVE SARS-CoV-2 target nucleic acids are NOT DETECTED. The SARS-CoV-2 RNA is generally detectable in upper and lower  respiratory specimens during the acute phase of infection. The lowest  concentration of SARS-CoV-2 viral  copies this assay can detect is 250  copies / mL. A negative result does not preclude SARS-CoV-2 infection  and should not be used as the sole basis for treatment or other  patient management decisions.  A negative result may occur with  improper specimen collection / handling, submission of specimen other  than nasopharyngeal swab, presence of viral mutation(s) within the  areas targeted by this assay, and inadequate number of viral copies  (<250 copies / mL). A negative result must be combined with clinical  observations, patient history, and epidemiological information. If result is POSITIVE SARS-CoV-2 target nucleic acids are DETECTED. The SARS-CoV-2 RNA is generally detectable in upper and lower  respiratory specimens dur ing the acute phase of infection.  Positive  results are indicative of active infection with SARS-CoV-2.  Clinical  correlation with patient history and other diagnostic information is  necessary to determine patient infection status.  Positive results do  not rule out bacterial infection or co-infection with other viruses. If result is PRESUMPTIVE POSTIVE SARS-CoV-2 nucleic acids MAY BE PRESENT.   A presumptive positive result was obtained on the submitted specimen  and confirmed on repeat testing.  While 2019 novel coronavirus  (SARS-CoV-2) nucleic acids may be present in the submitted sample  additional confirmatory testing may be necessary for epidemiological  and / or clinical management purposes  to differentiate between  SARS-CoV-2 and other Sarbecovirus currently known to infect humans.  If clinically indicated additional testing with an alternate test  methodology [phone]) is advised. The SARS-CoV-2 RNA is generally  detectable in upper and lower respiratory sp ecimens during the acute  phase of infection. The expected result is Negative. Fact Sheet for Patients:  StrictlyIdeas.no Fact Sheet for Healthcare  Providers: BankingDealers.co.za This test is not yet approved or cleared by the [place] FDA and has been authorized for detection and/or diagnosis of SARS-CoV-2 by FDA under an Emergency Use Authorization (EUA).  This EUA will remain in effect (meaning this test can be used) for the duration of the COVID-19 declaration under Section 564(b)(1) of the Act, 21 U.S.C. section 360bbb-3(b)(1), unless the authorization is terminated or revoked sooner. Performed at [facility], [place] [address], [place], [place] [address]     Studies/Results: Dg Chest 2 View  Result Date: 07/22/2018 CLINICAL DATA:  Sickle cell crisis.  Pain EXAM: CHEST - 2 VIEW COMPARISON:  July 21, 2018 FINDINGS: There is atelectatic change in the left base. The lungs elsewhere are clear. Heart size and pulmonary vascularity are normal. No adenopathy. There is a vascular necrosis in the right humeral head. Several thoracic and lumbar spine endplate infarcts are again noted. IMPRESSION: New left base atelectasis. Lungs elsewhere clear. Stable cardiac silhouette. No adenopathy. Bony changes indicative of sickle cell disease as noted. Electronically Signed   By: Lowella Grip III M.D.   On: 07/22/2018 14:18   Dg Chest 2 View  Result Date: 07/21/2018 CLINICAL DATA:  Chest pain.  Sickle cell disease. EXAM: CHEST - 2 VIEW COMPARISON:  Jul 03, 2018 FINDINGS: No edema or consolidation. Heart size and pulmonary vascularity are normal. No adenopathy. No pneumothorax. 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 There are endplate infarcts in several vertebral bodies. IMPRESSION: Lungs clear. No adenopathy. Bony changes indicative of known sickle cell disease. Electronically Signed   By: Lowella Grip III M.D.   On: 07/21/2018 16:27    Medications: Scheduled Meds: . cholecalciferol  2,000 Units Oral BID  . folic acid  1 mg Oral Daily  . heparin  5,000 Units Subcutaneous Q8H  .  HYDROmorphone   Intravenous Q4H  . hydroxyurea  1,500 mg Oral Daily  . oxyCODONE  10 mg Oral Q12H  . predniSONE  60 mg Oral Daily  . senna-docusate  1 tablet Oral BID  . sodium chloride flush  10-40 mL Intracatheter Q12H  . cyanocobalamin  2,000 mcg Oral Daily   Continuous Infusions: . sodium chloride 50 mL/hr at 07/23/18 0841  . diphenhydrAMINE     PRN Meds:.alum & mag hydroxide-simeth, diphenhydrAMINE **OR** diphenhydrAMINE, naloxone **AND** sodium chloride flush, naloxone **AND** [DISCONTINUED] sodium chloride flush, oxyCODONE, polyethylene glycol, sodium chloride flush   Assessment/Plan: Active Problems:   Sickle cell pain crisis (HCC)   Leukocytosis   Nephrotic syndrome   Chronic pain syndrome   CKD (chronic kidney disease), stage III (HCC)  Sickle cell disease with pain crisis: Continue IV fluids, 0.45% saline at 50 mL/h Continue Dilaudid PCA per weight-based protocol Oxycodone immediate release 5 mg every 4 hours as needed for moderate to severe breakthrough pain Maintain oxygen saturation above 90%, 2 L supplemental oxygen as needed Monitor vital signs closely Reevaluate pain scale regularly  Chronic kidney disease with nephrotic syndrome related to sickle cell disease: Stable.  Creatinine 2.6, baseline is 2.2-2.3.  Patient is followed by outpatient nephrology.  Continue to avoid all nephrotoxic medications.  Gentle hydration.  Anemia of chronic disease: Hemoglobin decreased to 6.3.  Below patient's baseline.  Transfuse 1 unit of PRBCs. Follow CBC  History of polysubstance abuse: UDS pending.  Will review results as they become available.  Code Status: Full Code Family Communication: N/A Disposition Plan: Not yet ready for discharge   [place], MSN, FNP-C [name] [address] [place], [place] [address] [phone]  If 5PM-7AM, please contact night-coverage.  07/23/2018, 11:32 AM  LOS: 2 days

## 2018-07-24 DIAGNOSIS — G894 Chronic pain syndrome: Secondary | ICD-10-CM

## 2018-07-24 DIAGNOSIS — D571 Sickle-cell disease without crisis: Secondary | ICD-10-CM

## 2018-07-24 DIAGNOSIS — N183 Chronic kidney disease, stage 3 (moderate): Secondary | ICD-10-CM

## 2018-07-24 LAB — CBC WITH DIFFERENTIAL/PLATELET
Abs Immature Granulocytes: 0.12 10*3/uL — ABNORMAL HIGH (ref 0.00–0.07)
Basophils Absolute: 0 10*3/uL (ref 0.0–0.1)
Basophils Relative: 0 %
Eosinophils Absolute: 0 10*3/uL (ref 0.0–0.5)
Eosinophils Relative: 0 %
HCT: 22.9 % — ABNORMAL LOW (ref 39.0–52.0)
Hemoglobin: 7.6 g/dL — ABNORMAL LOW (ref 13.0–17.0)
Immature Granulocytes: 1 %
Lymphocytes Relative: 18 %
Lymphs Abs: 1.9 10*3/uL (ref 0.7–4.0)
MCH: 36.5 pg — ABNORMAL HIGH (ref 26.0–34.0)
MCHC: 33.2 g/dL (ref 30.0–36.0)
MCV: 110.1 fL — ABNORMAL HIGH (ref 80.0–100.0)
Monocytes Absolute: 0.7 10*3/uL (ref 0.1–1.0)
Monocytes Relative: 7 %
Neutro Abs: 7.5 10*3/uL (ref 1.7–7.7)
Neutrophils Relative %: 74 %
Platelets: 193 10*3/uL (ref 150–400)
RBC: 2.08 MIL/uL — ABNORMAL LOW (ref 4.22–5.81)
RDW: 33.2 % — ABNORMAL HIGH (ref 11.5–15.5)
WBC: 10.2 10*3/uL (ref 4.0–10.5)
nRBC: 5.8 % — ABNORMAL HIGH (ref 0.0–0.2)

## 2018-07-24 LAB — COMPREHENSIVE METABOLIC PANEL
ALT: 46 U/L — ABNORMAL HIGH (ref 0–44)
AST: 27 U/L (ref 15–41)
Albumin: 2.7 g/dL — ABNORMAL LOW (ref 3.5–5.0)
Alkaline Phosphatase: 146 U/L — ABNORMAL HIGH (ref 38–126)
Anion gap: 8 (ref 5–15)
BUN: 57 mg/dL — ABNORMAL HIGH (ref 6–20)
CO2: 18 mmol/L — ABNORMAL LOW (ref 22–32)
Calcium: 8.3 mg/dL — ABNORMAL LOW (ref 8.9–10.3)
Chloride: 108 mmol/L (ref 98–111)
Creatinine, Ser: 2.53 mg/dL — ABNORMAL HIGH (ref 0.61–1.24)
GFR calc Af Amer: 37 mL/min — ABNORMAL LOW (ref >60)
GFR calc non Af Amer: 32 mL/min — ABNORMAL LOW (ref >60)
Glucose, Bld: 103 mg/dL — ABNORMAL HIGH (ref 70–99)
Potassium: 4.6 mmol/L (ref 3.5–5.1)
Sodium: 134 mmol/L — ABNORMAL LOW (ref 135–145)
Total Bilirubin: 1.1 mg/dL (ref 0.3–1.2)
Total Protein: 5.5 g/dL — ABNORMAL LOW (ref 6.5–8.1)

## 2018-07-24 LAB — BPAM RBC
Blood Product Expiration Date: 202007052359
ISSUE DATE / TIME: 202006091043
Unit Type and Rh: 9500

## 2018-07-24 LAB — TYPE AND SCREEN
ABO/RH(D): O POS
Antibody Screen: NEGATIVE
Donor AG Type: NEGATIVE
Unit division: 0

## 2018-07-24 MED ORDER — OXYCODONE HCL 10 MG PO TABS: 10.0000 mg | ORAL_TABLET | ORAL | 0 refills | Status: AC | PRN

## 2018-07-24 NOTE — Consult Note (Signed)
   [facility] CM Inpatient Consult   07/24/2018  [name] [name]. 1980-03-25 [id number]  Patient screened for potential [facility] Care Management services due to unplanned readmission risk score of 53%, extreme, 30 day readmission, and multiple hospitalizations. Patient is eligible under ACO plan.    Spoke with [name] [name] by telephone regarding [facility] CM services. HIPAA verified. Explained [facility] Care Management chronic disease management program to patient. Assessed for community needs when he returns home. [name] [name] states that he would like to follow up with a community RN for assistance with disease management. 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 Denies need for medication assistance or follow up at this time.   Referral placed for [facility] CM community RN follow up when transitions home.  Of note, [facility] Care Management services does not replace or interfere with any services that are arranged by inpatient case management or social work.  Netta Cedars, MSN, [facility] Liaison Nurse Mobile Phone [phone]  Toll free office [phone]

## 2018-07-24 NOTE — Discharge Instructions (Signed)
°Resume all home medications.  °Follow up with PCP as previously  scheduled.  ° °Discussed the importance of drinking 64 ounces of water daily to  help prevent pain crises, it is important to drink plenty of water throughout the day. This is because dehydration of red blood cells may lead further sickling.  ° °Avoid all stressors that precipitate sickle cell pain crisis.   ° ° °The patient was given clear instructions to go to ER or return to medical center if symptoms do not improve, worsen or new problems develop.  ° ° ° °Sickle Cell Anemia, Adult °Sickle cell anemia is a condition where your red blood cells are shaped like sickles. Red blood cells carry oxygen through the body. Sickle-shaped cells do not live as long as normal red blood cells. They also clump together and block blood from flowing through the blood vessels. This prevents the body from getting enough oxygen. Sickle cell anemia causes organ damage and pain. It also increases the risk of infection. °Follow these instructions at home: °Medicines °· Take over-the-counter and prescription medicines only as told by your doctor. °· If you were prescribed an antibiotic medicine, take it as told by your doctor. Do not stop taking the antibiotic even if you start to feel better. °· If you develop a fever, do not take medicines to lower the fever right away. Tell your doctor about the fever. °Managing pain, stiffness, and swelling °· Try these methods to help with pain: °? Use a heating pad. °? Take a warm bath. °? Distract yourself, such as by watching TV. °Eating and drinking °· Drink enough fluid to keep your pee (urine) clear or pale yellow. Drink more in hot weather and during exercise. °· Limit or avoid alcohol. °· Eat a healthy diet. Eat plenty of fruits, vegetables, whole grains, and lean protein. °· Take vitamins and supplements as told by your doctor. °Traveling °· When traveling, keep these with you: °? Your medical information. °? The names of  your doctors. °? Your medicines. °· If you need to take an airplane, talk to your doctor first. °Activity °· Rest often. °· Avoid exercises that make your heart beat much faster, such as jogging. °General instructions °· Do not use products that have nicotine or tobacco, such as cigarettes and e-cigarettes. If you need help quitting, ask your doctor. °· Consider wearing a medical alert bracelet. °· Avoid being in high places (high altitudes), such as mountains. °· Avoid very hot or cold temperatures. °· Avoid places where the temperature changes a lot. °· Keep all follow-up visits as told by your doctor. This is important. °Contact a doctor if: °· A joint hurts. °· Your feet or hands hurt or swell. °· You feel tired (fatigued). °Get help right away if: °· You have symptoms of infection. These include: °? Fever. °? Chills. °? Being very tired. °? Irritability. °? Poor eating. °? Throwing up (vomiting). °· You feel dizzy or faint. °· You have new stomach pain, especially on the left side. °· You have a an erection (priapism) that lasts more than 4 hours. °· You have numbness in your arms or legs. °· You have a hard time moving your arms or legs. °· You have trouble talking. °· You have pain that does not go away when you take medicine. °· You are short of breath. °· You are breathing fast. °· You have a long-term cough. °· You have pain in your chest. °· You have a bad headache. °·   You have a stiff neck. °· Your stomach looks bloated even though you did not eat much. °· Your skin is pale. °· You suddenly cannot see well. °Summary °· Sickle cell anemia is a condition where your red blood cells are shaped like sickles. °· Follow your doctor's advice on ways to manage pain, food to eat, activities to do, and steps to take for safe travel. °· Get medical help right away if you have any signs of infection, such as a fever. °This information is not intended to replace advice given to you by your health care provider. Make  sure you discuss any questions you have with your health care provider. °Document Released: 11/20/2012 Document Revised: 03/07/2016 Document Reviewed: 03/07/2016 °Elsevier Interactive Patient Education © 2019 [employer] ° °

## 2018-07-24 NOTE — Care Management Important Message (Signed)
Important Message  Patient Details IM letter given to Servando Snare SW to present to the Patient Name: [name] [name]. MRN: [id number] Date of Birth: November 05, 1980   Medicare Important Message Given:  Yes    Kerin Salen 07/24/2018, 11:34 AM

## 2018-07-24 NOTE — Discharge Summary (Signed)
Physician Discharge Summary  [name] [name]. UXL:[id number] DOB: June 11, 1980 DOA: 07/21/2018  PCP: Lanae Boast, FNP  Admit date: 07/21/2018  Discharge date: 07/24/2018  Discharge Diagnoses:  Active Problems:   Sickle cell pain crisis (HCC)   Leukocytosis   Nephrotic syndrome   Chronic pain syndrome   CKD (chronic kidney disease), stage III ([place])   Discharge Condition: Stable  Disposition:  Pt is discharged home in good condition and is to follow up with Cammie Sickle in 2 weeks  to have labs evaluated. [name] A Capito Jr. is instructed to increase activity slowly and balance with rest for the next few days, and use prescribed medication to complete treatment of pain  Diet: Regular Wt Readings from Last 3 Encounters:  07/24/18 55.2 kg  07/03/18 61.5 kg  06/09/18 59 kg    History of present illness:  [name] [name], a 38 year old male with a known history of sickle cell disease, chronic kidney disease/nephrotic syndrome, history of polysubstance abuse, history of GERD presents to ER for evaluation of pain crisis.  Patient states that he was in usual state of health until today, when he developed worsening left-sided chest discomfort.  Chest pain traveled around chest and then localized to left arm.  Also, patient complaining of bilateral lower extremity pain, which is chronic.  Patient has been taking OTC Tylenol without relief.  He is run out of his pain medication and has recently transferred care to [facility] hematology.  Patient denies fever, chills, weakness, dizziness, shortness of breath, nausea, vomiting, constipation, diarrhea, abdominal pain, dysuria/frequency, changes in mental status.  Patient has been taking all other medications as prescribed and there is been no recent change in medication or diet.  Patient denies recent antibiotics.  He denies sick contacts, recent travel or exposure to COVID-19.  ER course: Patient received IV Dilaudid, Benadryl, and fluids.   Pain persisted despite treatment medical admission requested for management of sickle cell pain crisis.  Hospital Course:  Sickle cell anemia with pain crisis: [name] was admitted in sickle cell pain crisis.  Chest pain resolved throughout admission.  Patient was managed appropriately with IV Dilaudid PCA as well as other adjunct therapies per sickle cell management protocols.  Toradol held due to CKD stage III/nephrotic syndrome.  PCA was weaned appropriately and oxycodone 10 mg every 4 hours was utilized for moderate to severe breakthrough pain.  Pain intensity decreased to 5/10.  Patient states that he does not have opiate pain medications at home.  His previous PCP unable to prescribe opiate pain medications due to past cocaine use.  Also, patient was unable to follow with pain management due to polysubstance abuse.  Current urine drug screen negative for cocaine.  Patient states that it is been greater than a month since cocaine use.  Alcohol and drug services offered outpatient, patient refuses at this time.  To complete treatment, oxycodone 10 mg every 4 hours #30 was sent to pharmacy.  Patient advised to follow-up in primary care in 2 weeks to repeat CBC and CMP.  Anemia of chronic disease: Hemoglobin decreased to 6.3 during admission, which is below patient's baseline.  Patient was transfused 1 unit of PRBCs.  Hemoglobin 7.6 at discharge, which is consistent with patient's baseline.  Patient advised to follow-up in primary care for labs in 2 weeks. Patient to continue folic acid and hydroxyurea as previously prescribed.  Chronic kidney disease stage III/nephrotic syndrome: Patient was previously under the care of outpatient nephrology.  He has been lost  to follow-up over the past several months.  He says that he has to call and make a follow-up appointment.  Creatinine stable at 2.53, which is around patient's baseline.  Patient advised to continue to follow renal diet.  Patient is alert,  oriented, and ambulating without assistance.  He is afebrile and oxygen saturation is 100% on RA.  Patient is aware of all upcoming appointments. Patient was discharged home today in a hemodynamically stable condition.   Discharge Exam: Vitals:   07/24/18 0835 07/24/18 0907  BP: (!) 149/100   Pulse: 88   Resp: 16 15  Temp: 98.2 F (36.8 C)   SpO2: 100% 100%   Vitals:   07/24/18 0426 07/24/18 0514 07/24/18 0835 07/24/18 0907  BP: 121/78  (!) 149/100   Pulse: 86  88   Resp: 18 18 16 15   Temp: 97.9 F (36.6 C)  98.2 F (36.8 C)   TempSrc: Oral  Oral   SpO2: 98%  100% 100%  Weight: 55.2 kg     Height:       Physical Exam HENT:     Head: Normocephalic.  Eyes:     Pupils: Pupils are equal, round, and reactive to light.  Cardiovascular:     Rate and Rhythm: Normal rate and regular rhythm.  Pulmonary:     Effort: Pulmonary effort is normal.     Breath sounds: Normal breath sounds.  Abdominal:     General: Abdomen is flat.     Palpations: Abdomen is soft.  Musculoskeletal: Normal range of motion.  Skin:    General: Skin is warm.  Neurological:     General: No focal deficit present.     Mental Status: He is alert. Mental status is at baseline.  Psychiatric:        Mood and Affect: Mood normal.        Behavior: Behavior normal.        Thought Content: Thought content normal.        Judgment: Judgment normal.      Discharge Instructions  Discharge Instructions    AMB Referral to [facility] Care Management   Complete by:  As directed    Reason for consult:  Post hospital follow up   Diagnoses of:  Other   Other Diagnosis:  Chronic kidney disease, sickle cell disease   Expected date of contact:  1-3 days (reserved for hospital discharges)   Discharge patient   Complete by:  As directed    Discharge disposition:  01-Home or Self Care   Discharge patient date:  07/24/2018     Allergies as of 07/24/2018      Reactions   Nsaids Other (See Comments)   Kidney disease    Morphine And Related Other (See Comments)   "real bad headaches"      Medication List    TAKE these medications   acetaminophen 500 MG tablet Commonly known as:  TYLENOL Take 1,000 mg by mouth every 6 (six) hours as needed for moderate pain.   [employer] VITAMIN B12 1000 MCG tablet Generic drug:  cyanocobalamin Take 2,000 mcg by mouth daily.   ergocalciferol 1.25 MG (50000 UT) capsule Commonly known as:  Drisdol Take 1 capsule (50,000 Units total) by mouth once a week.   folic acid 1 MG tablet Commonly known as:  FOLVITE Take 1 tablet (1 mg total) daily by mouth.   gabapentin 300 MG capsule Commonly known as:  NEURONTIN Take 300 mg by mouth at bedtime.  hydroxyurea 500 MG capsule Commonly known as:  HYDREA TAKE 3 CAPSULES BY MOUTH DAILY What changed:    how much to take  how to take this  when to take this  additional instructions   nortriptyline 25 MG capsule Commonly known as:  PAMELOR Take 1 capsule (25 mg total) by mouth at bedtime as needed for sleep.   Oxycodone HCl 10 MG Tabs Take 1 tablet (10 mg total) by mouth every 4 (four) hours as needed for up to 5 days.   predniSONE 20 MG tablet Commonly known as:  DELTASONE Take 60 mg by mouth daily.   Vitamin D 50 MCG (2000 UT) tablet Take 2,000 Units by mouth 2 (two) times a day.       The results of significant diagnostics from this hospitalization (including imaging, microbiology, ancillary and laboratory) are listed below for reference.    Significant Diagnostic Studies: Dg Chest 2 View  Result Date: 07/22/2018 CLINICAL DATA:  Sickle cell crisis.  Pain EXAM: CHEST - 2 VIEW COMPARISON:  July 21, 2018 FINDINGS: There is atelectatic change in the left base. The lungs elsewhere are clear. Heart size and pulmonary vascularity are normal. No adenopathy. There is a vascular necrosis in the right humeral head. Several thoracic and lumbar spine endplate infarcts are again noted. IMPRESSION: New left base atelectasis.  Lungs elsewhere clear. Stable cardiac silhouette. No adenopathy. Bony changes indicative of sickle cell disease as noted. Electronically Signed   By: Lowella Grip III M.D.   On: 07/22/2018 14:18   Dg Chest 2 View  Result Date: 07/21/2018 CLINICAL DATA:  Chest pain.  Sickle cell disease. EXAM: CHEST - 2 VIEW COMPARISON:  Jul 03, 2018 FINDINGS: No edema or consolidation. Heart size and pulmonary vascularity are normal. No adenopathy. No pneumothorax. 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 There are endplate infarcts in several vertebral bodies. IMPRESSION: Lungs clear. No adenopathy. Bony changes indicative of known sickle cell disease. Electronically Signed   By: Lowella Grip III M.D.   On: 07/21/2018 16:27   Dg Chest Port 1 View  Result Date: 07/03/2018 CLINICAL DATA:  Chest pain EXAM: PORTABLE CHEST 1 VIEW COMPARISON:  Chest x-ray dated 05/05/2018 FINDINGS: Heart size is stable. There are hazy airspace opacities bilaterally. No pneumothorax. No large pleural effusion. Avascular necrosis is noted of the right humeral head. There are sclerotic areas in the proximal right humerus. IMPRESSION: 1. Hazy airspace opacities bilaterally which are nonspecific but can be seen in patients with a sickle cell crisis. 2. Avascular necrosis of the right humeral head. Electronically Signed   By: Constance Holster M.D.   On: 07/03/2018 17:22    Microbiology: Recent Results (from the past 240 hour(s))  SARS Coronavirus 2 (CEPHEID - Performed in [place] hospital lab), Hosp Order     Status: None   Collection Time: 07/21/18  7:03 PM  Result Value Ref Range Status   SARS Coronavirus 2 NEGATIVE NEGATIVE Final    Comment: (NOTE) If result is NEGATIVE SARS-CoV-2 target nucleic acids are NOT DETECTED. The SARS-CoV-2 RNA is generally detectable in upper and lower  respiratory specimens during the acute phase of infection. The lowest  concentration of SARS-CoV-2 viral copies this assay can  detect is 250  copies / mL. A negative result does not preclude SARS-CoV-2 infection  and should not be used as the sole basis for treatment or other  patient management decisions.  A negative result may occur with  improper specimen collection /  handling, submission of specimen other  than nasopharyngeal swab, presence of viral mutation(s) within the  areas targeted by this assay, and inadequate number of viral copies  (<250 copies / mL). A negative result must be combined with clinical  observations, patient history, and epidemiological information. If result is POSITIVE SARS-CoV-2 target nucleic acids are DETECTED. The SARS-CoV-2 RNA is generally detectable in upper and lower  respiratory specimens dur ing the acute phase of infection.  Positive  results are indicative of active infection with SARS-CoV-2.  Clinical  correlation with patient history and other diagnostic information is  necessary to determine patient infection status.  Positive results do  not rule out bacterial infection or co-infection with other viruses. If result is PRESUMPTIVE POSTIVE SARS-CoV-2 nucleic acids MAY BE PRESENT.   A presumptive positive result was obtained on the submitted specimen  and confirmed on repeat testing.  While 2019 novel coronavirus  (SARS-CoV-2) nucleic acids may be present in the submitted sample  additional confirmatory testing may be necessary for epidemiological  and / or clinical management purposes  to differentiate between  SARS-CoV-2 and other Sarbecovirus currently known to infect humans.  If clinically indicated additional testing with an alternate test  methodology [phone]) is advised. The SARS-CoV-2 RNA is generally  detectable in upper and lower respiratory sp ecimens during the acute  phase of infection. The expected result is Negative. Fact Sheet for Patients:  StrictlyIdeas.no Fact Sheet for Healthcare  Providers: BankingDealers.co.za This test is not yet approved or cleared by the [place] FDA and has been authorized for detection and/or diagnosis of SARS-CoV-2 by FDA under an Emergency Use Authorization (EUA).  This EUA will remain in effect (meaning this test can be used) for the duration of the COVID-19 declaration under Section 564(b)(1) of the Act, 21 U.S.C. section 360bbb-3(b)(1), unless the authorization is terminated or revoked sooner. Performed at [facility] [place] [address], [place], [place] [address]      Labs: Basic Metabolic Panel: Recent Labs  Lab 07/21/18 1620 07/22/18 0535 07/23/18 0516 07/24/18 0554  NA 136 135 135 134*  K 4.7 4.1 4.4 4.6  CL 110 111 111 108  CO2 18* 16* 19* 18*  GLUCOSE 137* 133* 105* 103*  BUN 59* 52* 55* 57*  CREATININE 2.54* 2.62* 2.51* 2.53*  CALCIUM 8.4* 8.3* 8.1* 8.3*   Liver Function Tests: Recent Labs  Lab 07/21/18 1620 07/22/18 0535 07/23/18 0516 07/24/18 0554  AST 29 27 22 27   ALT 60* 48* 43 46*  ALKPHOS 210* 167* 156* 146*  BILITOT 2.7* 2.5* 1.8* 1.1  PROT 6.0* 5.2* 5.3* 5.5*  ALBUMIN 2.9* 2.5* 2.6* 2.7*   No results for input(s): LIPASE, AMYLASE in the last 168 hours. No results for input(s): AMMONIA in the last 168 hours. CBC: Recent Labs  Lab 07/21/18 1620 07/22/18 0535 07/23/18 0516 07/23/18 1805 07/24/18 0554  WBC 10.6* 15.1* 11.1*  --  10.2  NEUTROABS 9.3* 9.0* 7.9*  --  7.5  HGB 7.8* 6.8* 6.3* 7.8* 7.6*  HCT 23.5* 20.4* 18.9* 23.4* 22.9*  MCV 122.4* 123.6* 121.2*  --  110.1*  PLT 180 159 167  --  193   Cardiac Enzymes: No results for input(s): CKTOTAL, CKMB, CKMBINDEX, TROPONINI in the last 168 hours. BNP: Invalid input(s): POCBNP CBG: No results for input(s): GLUCAP in the last 168 hours.  Time coordinating discharge: 50 minutes  Signed:  [name]  APRN, MSN, FNP-C Patient [place]  [place], [place] [address] [phone]  [facility] 07/24/2018, 4:51 PM

## 2018-07-25 ENCOUNTER — Other Ambulatory Visit: Payer: Self-pay

## 2018-07-25 NOTE — Patient Outreach (Signed)
[facility]) Care Management  07/25/2018  [name] Kronenberger May 03, 1980 [id number]   Referral Date: 07/25/2018 Referral Source: Hospital Liaison Referral Reason: Follow up disease management   Outreach Attempt: no answer.  HIPAA compliant voice message left.     Plan: RN CM will attempt again within 4 business days and send letter.    Jone Baseman, RN, MSN [facility] Care Management Care Management Coordinator Direct Line [phone] Toll Free: [phone]  Fax: [phone]

## 2018-07-30 ENCOUNTER — Encounter: Payer: Self-pay | Admitting: Family Medicine

## 2018-07-30 ENCOUNTER — Other Ambulatory Visit: Payer: Self-pay

## 2018-07-30 ENCOUNTER — Ambulatory Visit (INDEPENDENT_AMBULATORY_CARE_PROVIDER_SITE_OTHER): Payer: Medicare Other | Admitting: Family Medicine

## 2018-07-30 ENCOUNTER — Ambulatory Visit (HOSPITAL_COMMUNITY)
Admission: RE | Admit: 2018-07-30 | Discharge: 2018-07-30 | Disposition: A | Discharge status: Home / Self Care | Payer: Medicare Other | Source: Ambulatory Visit | Attending: Family Medicine | Admitting: Family Medicine

## 2018-07-30 VITALS — BP 123/84 | HR 126 | Temp 99.2°F | Resp 16 | Ht 63.0 in | Wt 131.0 lb

## 2018-07-30 DIAGNOSIS — E559 Vitamin D deficiency, unspecified: Secondary | ICD-10-CM | POA: Diagnosis not present

## 2018-07-30 DIAGNOSIS — F1123 Opioid dependence with withdrawal: Secondary | ICD-10-CM | POA: Diagnosis not present

## 2018-07-30 DIAGNOSIS — D5709 Hb-ss disease with crisis with other specified complication: Secondary | ICD-10-CM

## 2018-07-30 DIAGNOSIS — N08 Glomerular disorders in diseases classified elsewhere: Secondary | ICD-10-CM

## 2018-07-30 DIAGNOSIS — D571 Sickle-cell disease without crisis: Secondary | ICD-10-CM | POA: Diagnosis not present

## 2018-07-30 DIAGNOSIS — D5700 Hb-SS disease with crisis, unspecified: Secondary | ICD-10-CM

## 2018-07-30 DIAGNOSIS — G894 Chronic pain syndrome: Secondary | ICD-10-CM

## 2018-07-30 LAB — CBC WITH DIFFERENTIAL/PLATELET
Abs Immature Granulocytes: 0.03 10*3/uL (ref 0.00–0.07)
Basophils Absolute: 0 10*3/uL (ref 0.0–0.1)
Basophils Relative: 0 %
Eosinophils Absolute: 0 10*3/uL (ref 0.0–0.5)
Eosinophils Relative: 0 %
HCT: 27.3 % — ABNORMAL LOW (ref 39.0–52.0)
Hemoglobin: 9 g/dL — ABNORMAL LOW (ref 13.0–17.0)
Immature Granulocytes: 0 %
Lymphocytes Relative: 4 %
Lymphs Abs: 0.3 10*3/uL — ABNORMAL LOW (ref 0.7–4.0)
MCH: 37.2 pg — ABNORMAL HIGH (ref 26.0–34.0)
MCHC: 33 g/dL (ref 30.0–36.0)
MCV: 112.8 fL — ABNORMAL HIGH (ref 80.0–100.0)
Monocytes Absolute: 0.2 10*3/uL (ref 0.1–1.0)
Monocytes Relative: 3 %
Neutro Abs: 6.6 10*3/uL (ref 1.7–7.7)
Neutrophils Relative %: 93 %
Platelets: 188 10*3/uL (ref 150–400)
RBC: 2.42 MIL/uL — ABNORMAL LOW (ref 4.22–5.81)
WBC: 7.1 10*3/uL (ref 4.0–10.5)
nRBC: 8.8 % — ABNORMAL HIGH (ref 0.0–0.2)

## 2018-07-30 LAB — POCT URINALYSIS DIPSTICK
Bilirubin, UA: NEGATIVE
Glucose, UA: POSITIVE — AB
Ketones, UA: NEGATIVE
Leukocytes, UA: NEGATIVE
Nitrite, UA: NEGATIVE
Protein, UA: POSITIVE — AB
Spec Grav, UA: 1.02 (ref 1.010–1.025)
Urobilinogen, UA: 0.2 E.U./dL
pH, UA: 7 (ref 5.0–8.0)

## 2018-07-30 LAB — BASIC METABOLIC PANEL
Anion gap: 10 (ref 5–15)
BUN: 63 mg/dL — ABNORMAL HIGH (ref 6–20)
CO2: 20 mmol/L — ABNORMAL LOW (ref 22–32)
Calcium: 8.1 mg/dL — ABNORMAL LOW (ref 8.9–10.3)
Chloride: 110 mmol/L (ref 98–111)
Creatinine, Ser: 2.66 mg/dL — ABNORMAL HIGH (ref 0.61–1.24)
GFR calc Af Amer: 34 mL/min — ABNORMAL LOW (ref >60)
GFR calc non Af Amer: 30 mL/min — ABNORMAL LOW (ref >60)
Glucose, Bld: 126 mg/dL — ABNORMAL HIGH (ref 70–99)
Potassium: 4.1 mmol/L (ref 3.5–5.1)
Sodium: 140 mmol/L (ref 135–145)

## 2018-07-30 MED ORDER — OXYCODONE HCL ER 15 MG PO T12A: 15.0000 mg | EXTENDED_RELEASE_TABLET | Freq: Two times a day (BID) | ORAL | 0 refills | Status: DC

## 2018-07-30 MED ORDER — OXYCODONE HCL 10 MG PO TABS: 10.0000 mg | ORAL_TABLET | Freq: Four times a day (QID) | ORAL | 0 refills | Status: DC | PRN

## 2018-07-30 NOTE — Patient Outreach (Signed)
[facility]) Care Management  07/30/2018  [name] [name] 06-19-1980 [id number]   Referral Date: 07/25/2018 Referral Source: Hospital Liaison Referral Reason: Follow up disease management  Spoke with patient.  He is able to verify HIPAA.  Discussed reason for referral.  Discussed with patient [facility] care management services and that patient stated he was interested in disease management while he was hospitalized.  Patient states he was interested but he has changed his mind.  Advised patient that CM sent letter and brochure for future reference.  He verbalized understanding.      Plan: RN CM will close case.    Jone Baseman, RN, MSN [facility] Management Care Management Coordinator Direct Line [phone] Cell [phone] Toll Free: [phone]  Fax: [phone]

## 2018-07-30 NOTE — Patient Instructions (Addendum)
Increase water intake to 32 ounces per day.  Please follow renal diet per nephrology  Will follow up in 4 weeks for medication management.   Sickle Cell Anemia, Adult Sickle cell anemia is a condition where your red blood cells are shaped like sickles. Red blood cells carry oxygen through the body. Sickle-shaped cells do not live as long as normal red blood cells. They also clump together and block blood from flowing through the blood vessels. This prevents the body from getting enough oxygen. Sickle cell anemia causes organ damage and pain. It also increases the risk of infection. Follow these instructions at home: Medicines  Take over-the-counter and prescription medicines only as told by your doctor.  If you were prescribed an antibiotic medicine, take it as told by your doctor. Do not stop taking the antibiotic even if you start to feel better.  If you develop a fever, do not take medicines to lower the fever right away. Tell your doctor about the fever. Managing pain, stiffness, and swelling  Try these methods to help with pain: ? Use a heating pad. ? Take a warm bath. ? Distract yourself, such as by watching TV. Eating and drinking  Drink enough fluid to keep your pee (urine) clear or pale yellow. Drink more in hot weather and during exercise.  Limit or avoid alcohol.  Eat a healthy diet. Eat plenty of fruits, vegetables, whole grains, and lean protein.  Take vitamins and supplements as told by your doctor. Traveling  When traveling, keep these with you: ? Your medical information. ? The names of your doctors. ? Your medicines.  If you need to take an airplane, talk to your doctor first. Activity  Rest often.  Avoid exercises that make your heart beat much faster, such as jogging. General instructions  Do not use products that have nicotine or tobacco, such as cigarettes and e-cigarettes. If you need help quitting, ask your doctor.  Consider wearing a medical  alert bracelet.  Avoid being in high places (high altitudes), such as mountains.  Avoid very hot or cold temperatures.  Avoid places where the temperature changes a lot.  Keep all follow-up visits as told by your doctor. This is important. Contact a doctor if:  A joint hurts.  Your feet or hands hurt or swell.  You feel tired (fatigued). Get help right away if:  You have symptoms of infection. These include: ? Fever. ? Chills. ? Being very tired. ? Irritability. ? Poor eating. ? Throwing up (vomiting).  You feel dizzy or faint.  You have new stomach pain, especially on the left side.  You have a an erection (priapism) that lasts more than 4 hours.  You have numbness in your arms or legs.  You have a hard time moving your arms or legs.  You have trouble talking.  You have pain that does not go away when you take medicine.  You are short of breath.  You are breathing fast.  You have a long-term cough.  You have pain in your chest.  You have a bad headache.  You have a stiff neck.  Your stomach looks bloated even though you did not eat much.  Your skin is pale.  You suddenly cannot see well. Summary  Sickle cell anemia is a condition where your red blood cells are shaped like sickles.  Follow your doctor's advice on ways to manage pain, food to eat, activities to do, and steps to take for safe travel.  Get medical  help right away if you have any signs of infection, such as a fever. This information is not intended to replace advice given to you by your health care provider. Make sure you discuss any questions you have with your health care provider. Document Released: 11/20/2012 Document Revised: 03/07/2016 Document Reviewed: 03/07/2016 Elsevier Interactive Patient Education  2019 [employer].

## 2018-07-30 NOTE — Progress Notes (Signed)
Established Patient Office Visit  Subjective:  Patient ID: [name] [name]., male    DOB: Jan 01, 1981  Age: 38 y.o. MRN: [id number]  CC:  Chief Complaint  Patient presents with  . Sickle Cell Anemia  . Hospitalization Follow-up    sickle cell     HPI [name] [name]. 38 year old male with a medical history significant for sickle cell disease, opiate dependence, chronic pain syndrome, CKD stage III, sickle cell nephropathy, history of polysubstance abuse and history of anemia of chronic disease presents for a post hospital follow-up and evaluation of sickle cell disease.  [name] [name] was admitted to inpatient services on 07/21/2018 for sickle cell pain crisis.  Patient was discharged in stable condition and opiate pain medication was restarted to complete treatment.  Patient has been taking oxycodone 10 mg every 4 hours as needed for moderate to severe pain.  Patient states that he has been out of this medication for the past 2 to 3 days.  He is complaining of pain primarily to low back and lower extremities today.  Patient characterizes pain as 7/10, intermittent and throbbing.  Patient has been mostly taking over-the-counter Tylenol without sustained relief. Patient has a history of sickle cell nephropathy.  He is followed by nephrologist.  He was last evaluated 1 week ago and has been restarted on prednisone.  Patient states that he has a difficult time following a renal diet.  He also finds it difficult to comply with fluid restriction.  Patient denies headache, shortness of breath, blurred vision, paresthesias, chest pain, nausea, vomiting or diarrhea.  Patient denies sick contacts, recent travel, or exposure to COVID-19.  Past Medical History:  Diagnosis Date  . Arachnoid Cyst of brain bilaterally    "2 really small ones in the back of my head; inside; saw them w/MRI" (09/25/2012)  . Bacterial pneumonia ~ 2012   "caught it here in the hospital" (09/25/2012)  . Chronic kidney  disease    "from my sickle cell" (09/25/2012)  . CKD (chronic kidney disease), stage II   . GERD (gastroesophageal reflux disease)    "after I eat alot of spicey foods" (09/25/2012)  . Gynecomastia, male 07/10/2012  . History of blood transfusion    "always related to sickle cell crisis" (09/25/2012)  . 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   Noted in noted from Hematology notes at [facility]  . Migraines    "take RX qd to prevent them" (09/25/2012)  . Sickle cell anemia (HCC)   . Sickle cell crisis ([place]) 09/25/2012  . Sickle cell nephropathy ([place]) 07/10/2012  . Sinus tachycardia   . Tachycardia with heart rate 121-140 beats per minute with ambulation 08/04/2016    Past Surgical History:  Procedure Laterality Date  . CHOLECYSTECTOMY  ~ 2012  . COLONOSCOPY N/A 04/23/2017   Procedure: COLONOSCOPY;  Surgeon: Irene Shipper, MD;  Location: [facility];  Service: Endoscopy;  Laterality: N/A;  . IR FLUORO GUIDE CV LINE RIGHT  12/17/2016  . IR REMOVAL TUN CV CATH W/O FL  12/21/2016  . IR US GUIDE VASC ACCESS RIGHT  12/17/2016  . spleenectomy      Family History  Problem Relation Age of Onset  . Breast cancer Mother     Social History   Socioeconomic History  . Marital status: Single    Spouse name: Not on file  . Number of children: Not on file  . Years of education: Not on file  . Highest education level: Not on file  Occupational History  . Not on file  Social Needs  . Financial resource strain: Not on file  . Food insecurity    Worry: Not on file    Inability: Not on file  . Transportation needs    Medical: Not on file    Non-medical: Not on file  Tobacco Use  . Smoking status: Current Some Day Smoker    Packs/day: 0.10    Types: Cigarettes  . Smokeless tobacco: Never Used  . Tobacco comment: 09/25/2012 "I don't buy cigarettes; bum one from friends q now and then"  Substance and Sexual Activity  . Alcohol use: Yes    Alcohol/week: 0.0 standard drinks     Frequency: Never    Comment: Once a month  . Drug use: Not Currently    Types: Marijuana    Comment: Every 2-3 weeks   . Sexual activity: Yes  Lifestyle  . Physical activity    Days per week: Not on file    Minutes per session: Not on file  . Stress: Not on file  Relationships  . Social Herbalist on phone: Not on file    Gets together: Not on file    Attends religious service: Not on file    Active member of club or organization: Not on file    Attends meetings of clubs or organizations: Not on file    Relationship status: Not on file  . Intimate partner violence    Fear of current or ex partner: Not on file    Emotionally abused: Not on file    Physically abused: Not on file    Forced sexual activity: Not on file  Other Topics Concern  . Not on file  Social History Narrative    Lives alone in [place].   Back at school, studying Public relations account executive. Unemployed.    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     Outpatient Medications Prior to Visit  Medication Sig Dispense Refill  . [employer] VITAMIN B12 1000 MCG tablet Take 2,000 mcg by mouth daily.    . ergocalciferol (DRISDOL) 1.25 MG (50000 UT) capsule Take 1 capsule (50,000 Units total) by mouth once a week. 30 capsule 1  . folic acid (FOLVITE) 1 MG tablet Take 1 tablet (1 mg total) daily by mouth. 30 tablet 2  . hydroxyurea (HYDREA) 500 MG capsule TAKE 3 CAPSULES BY MOUTH DAILY (Patient taking differently: Take 1,500 mg by mouth daily. ) 270 capsule 1  . nortriptyline (PAMELOR) 25 MG capsule Take 1 capsule (25 mg total) by mouth at bedtime as needed for sleep. 90 capsule 3  . predniSONE (DELTASONE) 20 MG tablet Take 60 mg by mouth daily.    Marland Kitchen gabapentin (NEURONTIN) 300 MG capsule Take 300 mg by mouth at bedtime.    Marland Kitchen acetaminophen (TYLENOL) 500 MG tablet Take 1,000 mg by  mouth every 6 (six) hours as needed for moderate pain.    . Cholecalciferol (VITAMIN D) 50 MCG (2000 UT) tablet Take 2,000 Units by mouth 2 (two) times a day.     No facility-administered medications prior to visit.     Allergies  Allergen Reactions  . Nsaids Other (See Comments)    Kidney disease  . Morphine And Related Other (See Comments)    "real bad headaches"    ROS Review of Systems  Constitutional: Negative for chills and diaphoresis.  HENT: Negative.   Eyes: Negative for pain and itching.  Cardiovascular: Negative.   Gastrointestinal: Negative.   Endocrine: Negative.   Genitourinary: Negative.   Musculoskeletal: Positive for arthralgias and back pain.  Skin: Negative.   Neurological: Negative.   Hematological: Negative.   Psychiatric/Behavioral: Negative.       Objective:    Physical Exam  Constitutional: He is oriented to person, place, and time. He appears well-developed and well-nourished.  HENT:  Head: Normocephalic.  Eyes: Pupils are equal, round, and reactive to light.  Neck: Normal range of motion.  Cardiovascular: Normal rate and regular rhythm.  Pulmonary/Chest: Effort normal and breath sounds normal.  Abdominal: Soft. Bowel sounds are normal. He exhibits no distension. There is no abdominal tenderness.  Neurological: He is alert and oriented to person, place, and time.  Skin: Skin is warm and dry.  Psychiatric: He has a normal mood and affect. His behavior is normal. Judgment and thought content normal.    BP 123/84 (BP Location: Left Arm, Patient Position: Sitting, Cuff Size: Normal)   Pulse (!) 126   Temp 99.2 F (37.3 C) (Oral)   Resp 16   Ht 5\' 3"  (1.6 m)   Wt 131 lb (59.4 kg)   SpO2 100%   BMI 23.21 kg/m  Wt Readings from Last 3 Encounters:  07/30/18 131 lb (59.4 kg)  07/24/18 121 lb 11.1 oz (55.2 kg)  07/03/18 135 lb 8 oz (61.5 kg)     There are no preventive care reminders to display for this patient.  There are no preventive  care reminders to display for this patient.  Lab Results  Component Value Date   TSH 2.860 05/11/2017   Lab Results  Component Value Date   WBC 10.2 07/24/2018   HGB 7.6 (L) 07/24/2018   HCT 22.9 (L) 07/24/2018   MCV 110.1 (H) 07/24/2018   PLT 193 07/24/2018   Lab Results  Component Value Date   NA 134 (L) 07/24/2018   K 4.6 07/24/2018   CO2 18 (L) 07/24/2018   GLUCOSE 103 (H) 07/24/2018   BUN 57 (H) 07/24/2018   CREATININE 2.53 (H) 07/24/2018   BILITOT 1.1 07/24/2018   ALKPHOS 146 (H) 07/24/2018   AST 27 07/24/2018   ALT 46 (H) 07/24/2018   PROT 5.5 (L) 07/24/2018   ALBUMIN 2.7 (L) 07/24/2018   CALCIUM 8.3 (L) 07/24/2018   ANIONGAP 8 07/24/2018   Lab Results  Component Value Date   CHOL 145 03/07/2018   Lab Results  Component Value Date   HDL 65 03/07/2018   Lab Results  Component Value Date   LDLCALC 30 03/07/2018   Lab Results  Component Value Date   TRIG 251 (H) 03/07/2018   Lab Results  Component Value Date   CHOLHDL 2.2 03/07/2018   Lab Results  Component Value Date   HGBA1C <4.2 (L) 03/07/2018      Assessment & Plan:   Problem List Items Addressed This Visit      Cardiovascular and Mediastinum   Sickle cell nephropathy ([place]) - Primary (Chronic)   Relevant Orders   Urinalysis Dipstick (Completed)     Sickle cell nephropathy/CKD III ([place]) Patient is under the care of nephrology. He was started on Prednisone, which has been added to medication list.  -Medical records request for review.  Will forward BMP results to nephrology - Urinalysis Dipstick  Vitamin D deficiency Continue weekly vitamin d. Will evaluate vitamin D level at  4 week follow up.   Hb-SS disease without crisis (HCC) Continue Hydrea 1000 mg daily. Will check CBC for absolute neutrophil count and platelets. Will also check reticulocyte count.We discussed the need for good hydration, monitoring of hydration status, avoidance of heat, cold, stress, and infection triggers. We  discussed the risks and benefits of Hydrea, including bone marrow suppression, the possibility of GI upset, skin ulcers, hair thinning, and teratogenicity.  The patient was reminded of the need to seek medical attention of any symptoms of bleeding, anemia, or infection. Will start folic acid 1 mg daily to prevent aplastic bone marrow crises.   Patient denies severe recurrent wheezes, shortness of breath with exercise, or persistent cough. If these symptoms develop, pulmonary function tests with spirometry will be ordered, and if abnormal, plan on referral to Pulmonology for further evaluation.  Eye - High risk of proliferative retinopathy. Annual eye exam with retinal exam recommended to patient. Last eye examination greater than 1 year ago  Immunization status - Patient will receive influenza vaccination on today  Acute and chronic painful episodes - We agreed on restarting Oxycontin 15 mg every 12 hours # 60 and Oxycodone 10 mg every 6 hours #60.    We discussed that pt is to receive his Schedule II prescriptions only from [place]. Pt is also aware that the prescription history is available to [place] online through the [facility] CSRS. Controlled substance agreement signed previously. We reminded Just[place] that all patients receiving Schedule II narcotics must be seen for follow within one month of prescription being requested. We reviewed the terms of our pain agreement, including the need to keep medicines in a safe locked location away from children or pets, and the need to report excess sedation or constipation, measures to avoid constipation, and policies related to early refills and stolen prescriptions. According to the [place] [facility] program, we have reviewed details related to analgesia, adverse effects, aberrant behaviors. Reviewed PDMP system prior to prescribing opiate medication, no inconsistencies noted.    - oxyCODONE (OXYCONTIN) 15 mg 12 hr tablet; Take 1 tablet (15 mg total) by mouth every 12  (twelve) hours.  Dispense: 60 tablet; Refill: 0 - Oxycodone HCl 10 MG TABS; Take 1 tablet (10 mg total) by mouth every 6 (six) hours as needed for severe pain.  Dispense: 60 tablet; Refill: 0  Chronic pain syndrome - oxyCODONE (OXYCONTIN) 15 mg 12 hr tablet; Take 1 tablet (15 mg total) by mouth every 12 (twelve) hours.  Dispense: 60 tablet; Refill: 0 - Oxycodone HCl 10 MG TABS; Take 1 tablet (10 mg total) by mouth every 6 (six) hours as needed for severe pain.  Dispense: 60 tablet; Refill: 0    Follow-up: Follow up in 4 weeks for medication management and sickle cell disease.    [name]  APRN, MSN, FNP-C [facility] [address] [place], [place] [address] [phone]

## 2018-07-31 ENCOUNTER — Encounter: Payer: Self-pay | Admitting: Family Medicine

## 2018-08-04 IMAGING — CT CT ANGIO CHEST
2 of 6 series · 18 of 46 positions shown · IV contrast (ISOVUE)
Comparison: None.

CLINICAL DATA: Cough and pain

EXAM:
CT ANGIOGRAPHY CHEST WITH CONTRAST
TECHNIQUE: Multidetector CT imaging of the chest was performed using the
standard protocol during bolus administration of intravenous
contrast. Multiplanar CT image reconstructions and MIPs were
obtained to evaluate the vascular anatomy.
CONTRAST:  100mL CJAE5F-2S6 IOPAMIDOL (CJAE5F-2S6) INJECTION 76%

[Series 5: thins · axial · 0.61mm/px · z∈[+1454,+1667]mm · 15 of 235 slices shown]
[im 11/235  lung]
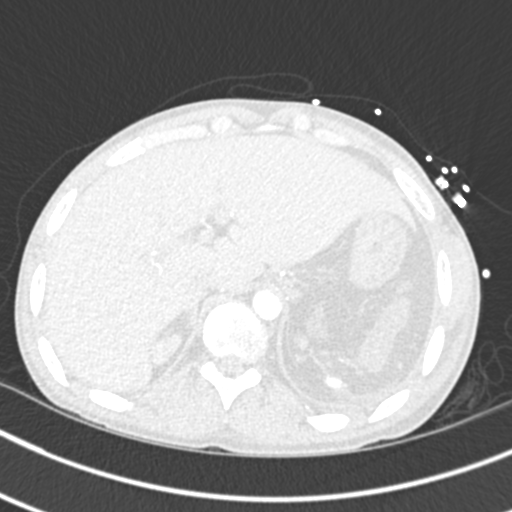
[im 31/235  soft-tissue]
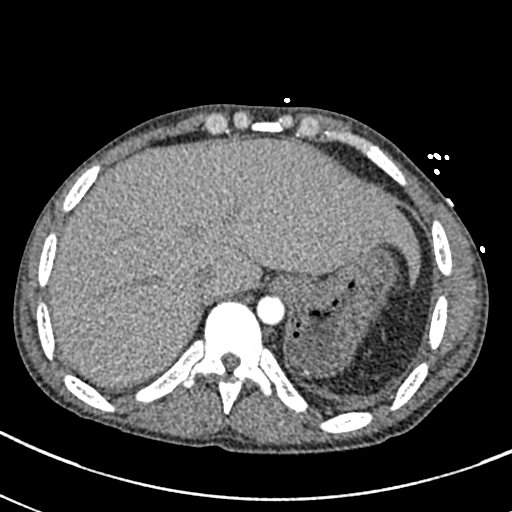
[im 41/235  lung]
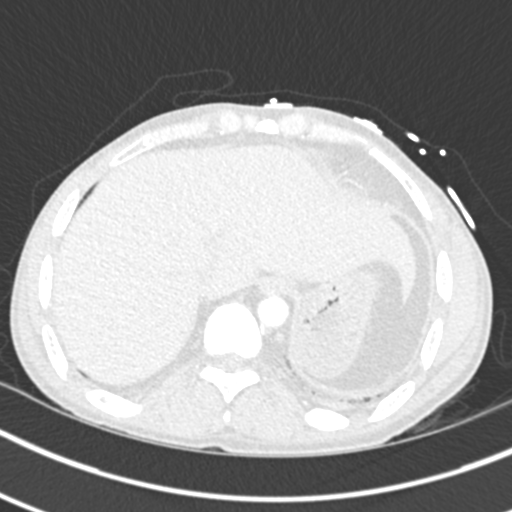
[im 62/235  soft-tissue]
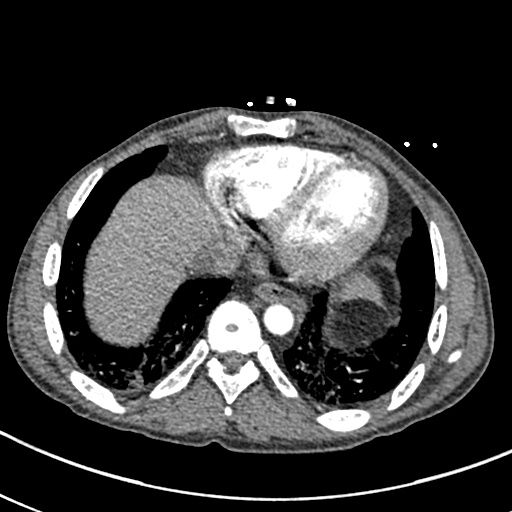
[im 72/235  lung]
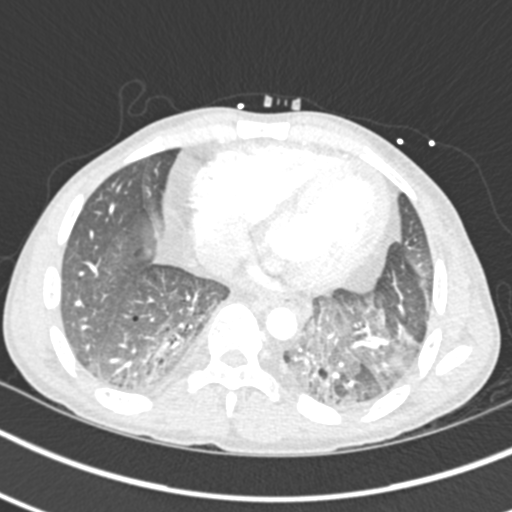
[im 92/235  soft-tissue]
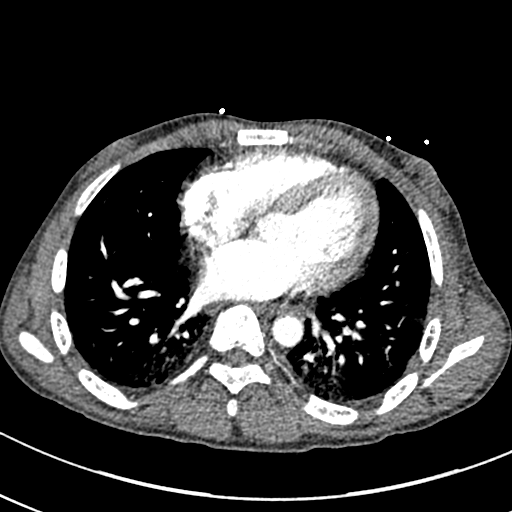
[im 102/235  lung]
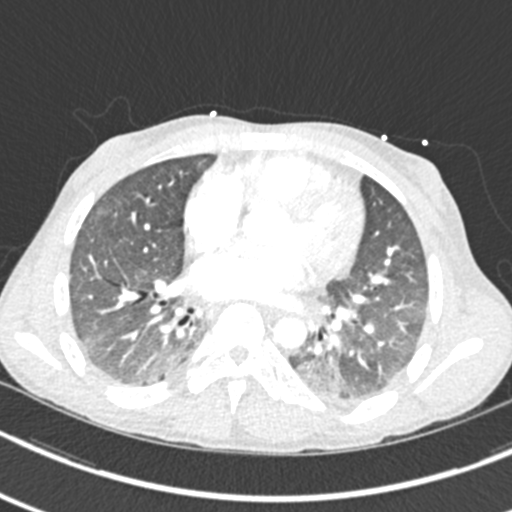
[im 123/235  soft-tissue]
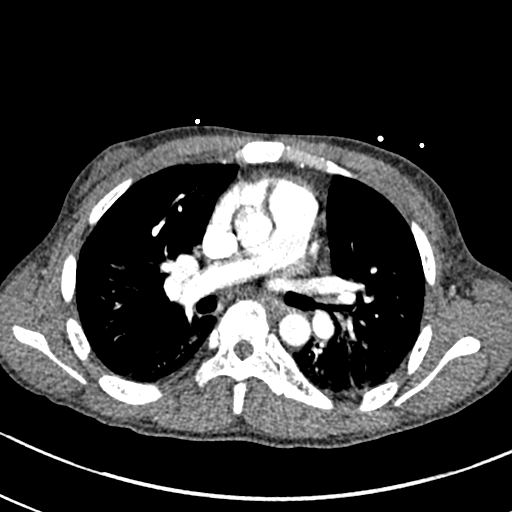
[im 133/235  lung]
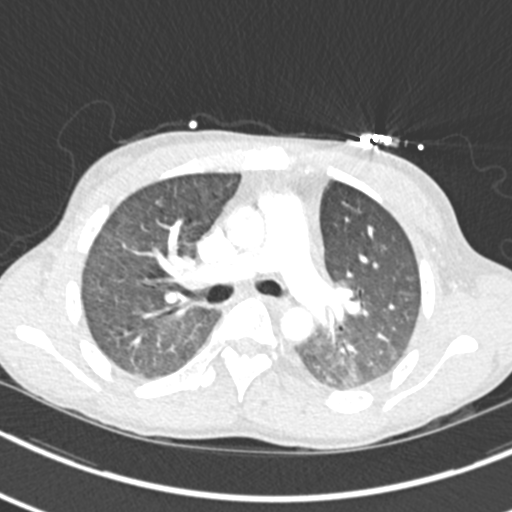
[im 143/235  soft-tissue]
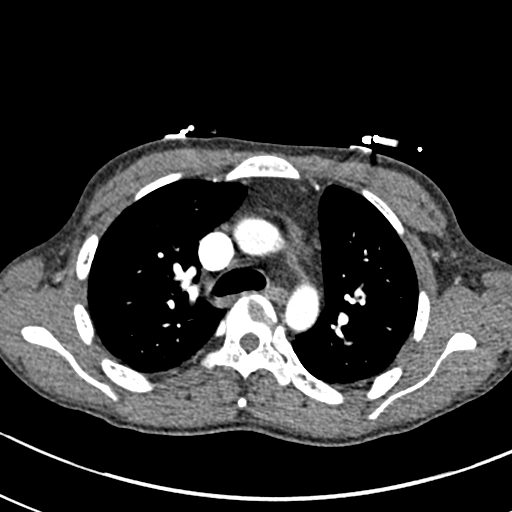
[im 163/235  lung]
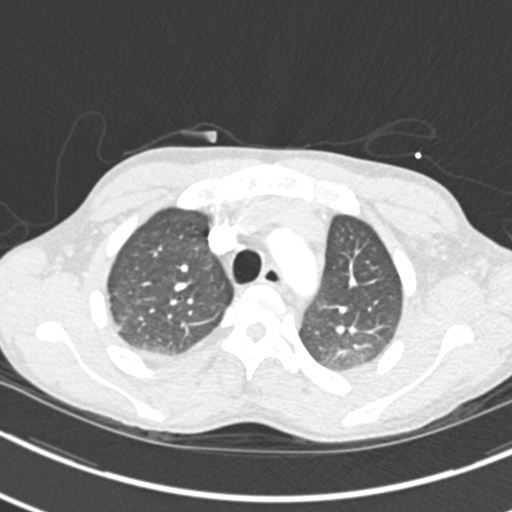
[im 173/235  soft-tissue]
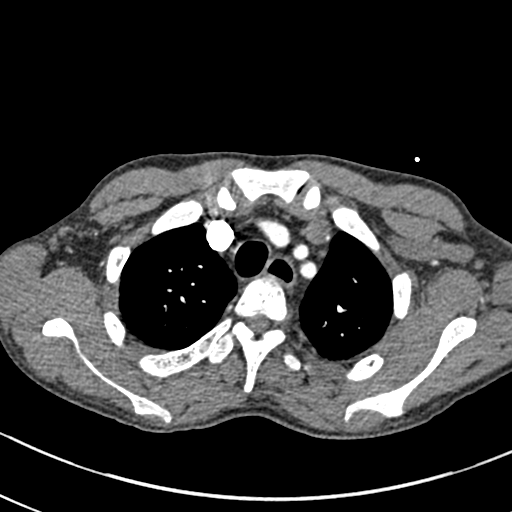
[im 194/235  lung]
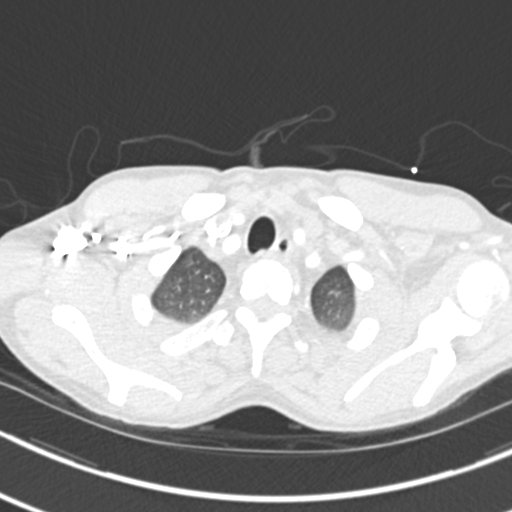
[im 204/235  soft-tissue]
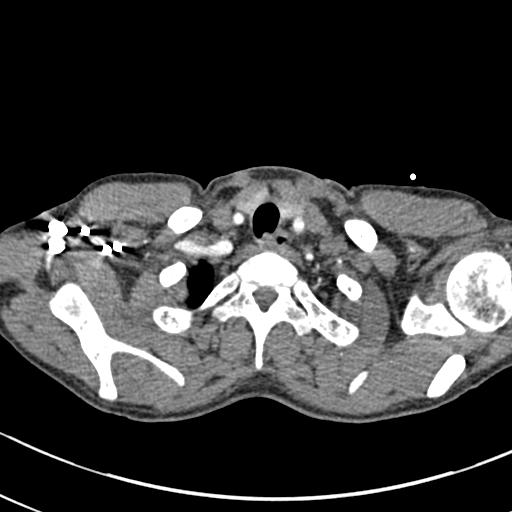
[im 224/235  lung]
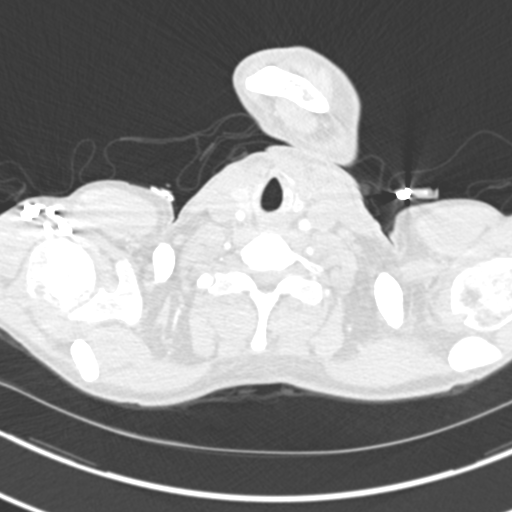

[Series 6: coronal mpr · coronal · 0.44mm/px · 3 of 112 slices shown]
[im 28/112  soft-tissue]
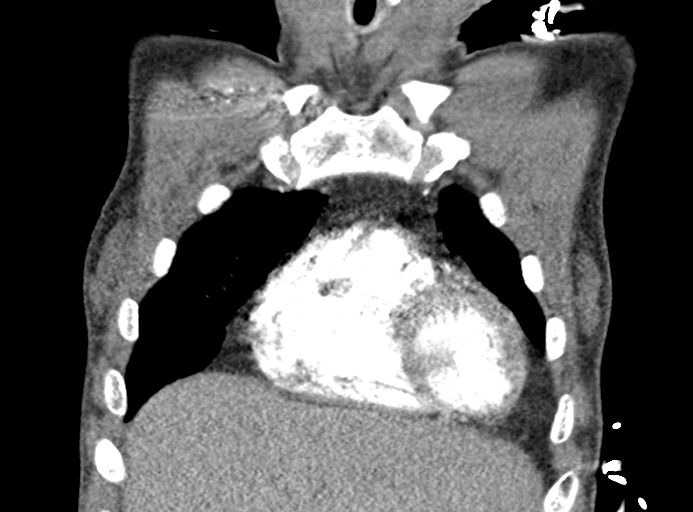
[im 56/112  soft-tissue]
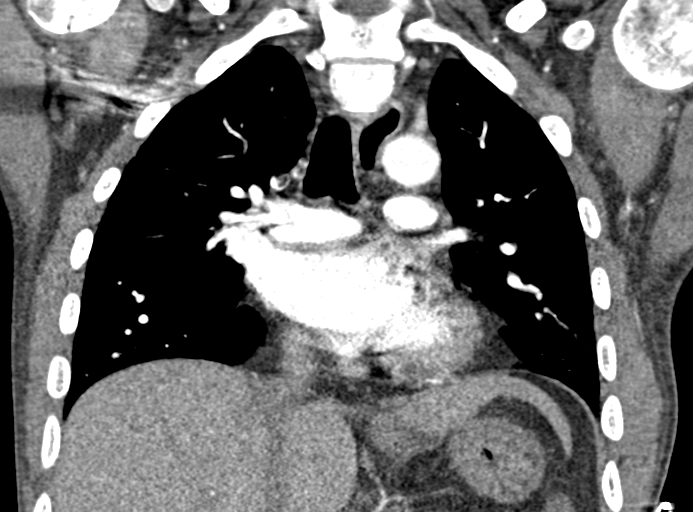
[im 84/112  soft-tissue]
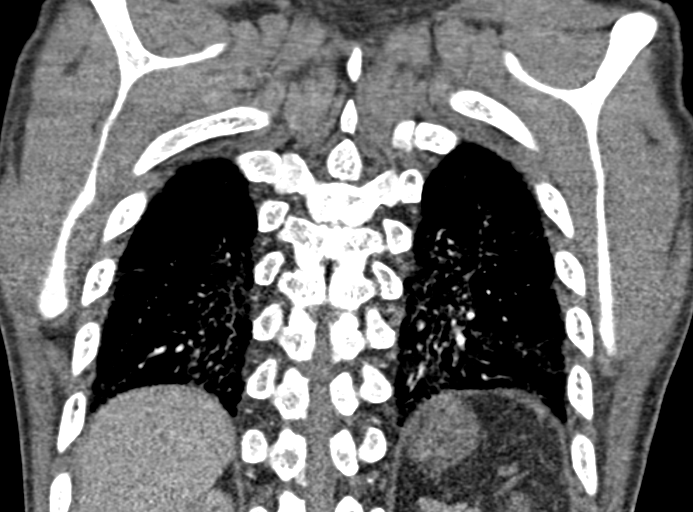

[18 of 46 positions shown; findings below may reference images not displayed]

FINDINGS: Cardiovascular: There are no filling defects in the pulmonary
arterial tree to suggest acute pulmonary thromboembolism. There is
no evidence of aortic dissection or aneurysm. Great vessels are
grossly patent.

Mediastinum/Nodes: No abnormal mediastinal adenopathy. No
pericardial effusion.

Lungs/Pleura: No pneumothorax. No pleural effusion. Lungs are under
aerated. There are bilateral dependent heterogeneous opacities with
the lungs, best characterized as ground-glass and streaky opacities.
No dominant lung mass. There are cystic areas within the dependent
lower lobes without obvious fibrotic change or honeycombing.
Nonspecific patchy and ground-glass opacities in the right upper
lobe on images 60 and 64 most consistent with an inflammatory
process.

Upper Abdomen: No acute abnormality.

Musculoskeletal: There are endplate changes consistent with sickle
cell disorder.

Review of the MIP images confirms the above findings.
IMPRESSION: No evidence of acute pulmonary thromboembolism.

There are bilateral dependent heterogeneous opacities in the lungs
as well as patchy right upper lobe opacities. These findings are
worrisome for an inflammatory process.

## 2018-08-09 ENCOUNTER — Encounter (HOSPITAL_COMMUNITY): Payer: Self-pay | Admitting: Emergency Medicine

## 2018-08-09 ENCOUNTER — Other Ambulatory Visit: Payer: Self-pay

## 2018-08-09 ENCOUNTER — Inpatient Hospital Stay (HOSPITAL_COMMUNITY)
Admission: EM | Admit: 2018-08-09 | Discharge: 2018-08-12 | DRG: 812 | Disposition: A | Discharge status: Home / Self Care | Payer: Medicare Other | Attending: Internal Medicine | Admitting: Internal Medicine

## 2018-08-09 DIAGNOSIS — Z79891 Long term (current) use of opiate analgesic: Secondary | ICD-10-CM

## 2018-08-09 DIAGNOSIS — N183 Chronic kidney disease, stage 3 unspecified: Secondary | ICD-10-CM | POA: Diagnosis present

## 2018-08-09 DIAGNOSIS — D638 Anemia in other chronic diseases classified elsewhere: Secondary | ICD-10-CM | POA: Diagnosis present

## 2018-08-09 DIAGNOSIS — G43909 Migraine, unspecified, not intractable, without status migrainosus: Secondary | ICD-10-CM | POA: Diagnosis present

## 2018-08-09 DIAGNOSIS — F1410 Cocaine abuse, uncomplicated: Secondary | ICD-10-CM | POA: Diagnosis present

## 2018-08-09 DIAGNOSIS — N179 Acute kidney failure, unspecified: Secondary | ICD-10-CM | POA: Diagnosis present

## 2018-08-09 DIAGNOSIS — K219 Gastro-esophageal reflux disease without esophagitis: Secondary | ICD-10-CM | POA: Diagnosis present

## 2018-08-09 DIAGNOSIS — Z7952 Long term (current) use of systemic steroids: Secondary | ICD-10-CM

## 2018-08-09 DIAGNOSIS — R339 Retention of urine, unspecified: Secondary | ICD-10-CM | POA: Diagnosis present

## 2018-08-09 DIAGNOSIS — D5700 Hb-SS disease with crisis, unspecified: Principal | ICD-10-CM | POA: Diagnosis present

## 2018-08-09 DIAGNOSIS — R000 Tachycardia, unspecified: Secondary | ICD-10-CM | POA: Diagnosis not present

## 2018-08-09 DIAGNOSIS — F1910 Other psychoactive substance abuse, uncomplicated: Secondary | ICD-10-CM | POA: Diagnosis present

## 2018-08-09 DIAGNOSIS — N059 Unspecified nephritic syndrome with unspecified morphologic changes: Secondary | ICD-10-CM | POA: Diagnosis present

## 2018-08-09 DIAGNOSIS — Z1159 Encounter for screening for other viral diseases: Secondary | ICD-10-CM | POA: Diagnosis not present

## 2018-08-09 DIAGNOSIS — G894 Chronic pain syndrome: Secondary | ICD-10-CM | POA: Diagnosis present

## 2018-08-09 DIAGNOSIS — Z803 Family history of malignant neoplasm of breast: Secondary | ICD-10-CM | POA: Diagnosis not present

## 2018-08-09 DIAGNOSIS — R338 Other retention of urine: Secondary | ICD-10-CM | POA: Diagnosis not present

## 2018-08-09 DIAGNOSIS — F17210 Nicotine dependence, cigarettes, uncomplicated: Secondary | ICD-10-CM | POA: Diagnosis present

## 2018-08-09 DIAGNOSIS — Z886 Allergy status to analgesic agent status: Secondary | ICD-10-CM

## 2018-08-09 DIAGNOSIS — Z885 Allergy status to narcotic agent status: Secondary | ICD-10-CM

## 2018-08-09 LAB — CBC WITH DIFFERENTIAL/PLATELET
Abs Immature Granulocytes: 0.21 10*3/uL — ABNORMAL HIGH (ref 0.00–0.07)
Basophils Absolute: 0 10*3/uL (ref 0.0–0.1)
Basophils Relative: 0 %
Eosinophils Absolute: 0 10*3/uL (ref 0.0–0.5)
Eosinophils Relative: 0 %
HCT: 19.9 % — ABNORMAL LOW (ref 39.0–52.0)
Hemoglobin: 6.8 g/dL — CL (ref 13.0–17.0)
Immature Granulocytes: 2 %
Lymphocytes Relative: 12 %
Lymphs Abs: 1.7 10*3/uL (ref 0.7–4.0)
MCH: 38.4 pg — ABNORMAL HIGH (ref 26.0–34.0)
MCHC: 34.2 g/dL (ref 30.0–36.0)
MCV: 112.4 fL — ABNORMAL HIGH (ref 80.0–100.0)
Monocytes Absolute: 0.7 10*3/uL (ref 0.1–1.0)
Monocytes Relative: 5 %
Neutro Abs: 11.7 10*3/uL — ABNORMAL HIGH (ref 1.7–7.7)
Neutrophils Relative %: 81 %
Platelets: 116 10*3/uL — ABNORMAL LOW (ref 150–400)
RBC: 1.77 MIL/uL — ABNORMAL LOW (ref 4.22–5.81)
WBC: 14.4 10*3/uL — ABNORMAL HIGH (ref 4.0–10.5)
nRBC: 2.5 % — ABNORMAL HIGH (ref 0.0–0.2)

## 2018-08-09 LAB — COMPREHENSIVE METABOLIC PANEL
ALT: 85 U/L — ABNORMAL HIGH (ref 0–44)
AST: 40 U/L (ref 15–41)
Albumin: 2.7 g/dL — ABNORMAL LOW (ref 3.5–5.0)
Alkaline Phosphatase: 182 U/L — ABNORMAL HIGH (ref 38–126)
Anion gap: 9 (ref 5–15)
BUN: 56 mg/dL — ABNORMAL HIGH (ref 6–20)
CO2: 24 mmol/L (ref 22–32)
Calcium: 8.4 mg/dL — ABNORMAL LOW (ref 8.9–10.3)
Chloride: 107 mmol/L (ref 98–111)
Creatinine, Ser: 2.48 mg/dL — ABNORMAL HIGH (ref 0.61–1.24)
GFR calc Af Amer: 38 mL/min — ABNORMAL LOW (ref >60)
GFR calc non Af Amer: 32 mL/min — ABNORMAL LOW (ref >60)
Glucose, Bld: 136 mg/dL — ABNORMAL HIGH (ref 70–99)
Potassium: 4.3 mmol/L (ref 3.5–5.1)
Sodium: 140 mmol/L (ref 135–145)
Total Bilirubin: 2 mg/dL — ABNORMAL HIGH (ref 0.3–1.2)
Total Protein: 5.2 g/dL — ABNORMAL LOW (ref 6.5–8.1)

## 2018-08-09 LAB — URINALYSIS, ROUTINE W REFLEX MICROSCOPIC
Bilirubin Urine: NEGATIVE
Glucose, UA: >=500 mg/dL — AB
Ketones, ur: NEGATIVE mg/dL
Leukocytes,Ua: NEGATIVE
Nitrite: NEGATIVE
Protein, ur: >=300 mg/dL — AB
Specific Gravity, Urine: 1.009 (ref 1.005–1.030)
pH: 7 (ref 5.0–8.0)

## 2018-08-09 LAB — SARS CORONAVIRUS 2 BY RT PCR (HOSPITAL ORDER, PERFORMED IN ~~LOC~~ HOSPITAL LAB): SARS Coronavirus 2: NEGATIVE

## 2018-08-09 LAB — RETICULOCYTES
Immature Retic Fract: 30.2 % — ABNORMAL HIGH (ref 2.3–15.9)
RBC.: 1.77 MIL/uL — ABNORMAL LOW (ref 4.22–5.81)
Retic Count, Absolute: 65.1 10*3/uL (ref 19.0–186.0)
Retic Ct Pct: 3.7 % — ABNORMAL HIGH (ref 0.4–3.1)

## 2018-08-09 LAB — RAPID URINE DRUG SCREEN, HOSP PERFORMED
Amphetamines: NOT DETECTED
Barbiturates: NOT DETECTED
Benzodiazepines: NOT DETECTED
Cocaine: POSITIVE — AB
Opiates: POSITIVE — AB
Tetrahydrocannabinol: NOT DETECTED

## 2018-08-09 MED ORDER — VITAMIN B-12 1000 MCG PO TABS
2000.0000 ug | ORAL_TABLET | Freq: Every day | ORAL | Status: DC
  Administered 2018-08-09 – 2018-08-12 (×4): 2000 ug via ORAL
  Filled 2018-08-09 (×4): qty 2

## 2018-08-09 MED ORDER — PREDNISONE 20 MG PO TABS
20.0000 mg | ORAL_TABLET | Freq: Three times a day (TID) | ORAL | Status: DC
  Administered 2018-08-09 – 2018-08-12 (×8): 20 mg via ORAL
  Filled 2018-08-09 (×8): qty 1

## 2018-08-09 MED ORDER — OXYCODONE HCL ER 15 MG PO T12A
15.0000 mg | EXTENDED_RELEASE_TABLET | Freq: Two times a day (BID) | ORAL | Status: DC
  Administered 2018-08-09 (×2): 15 mg via ORAL
  Filled 2018-08-09 (×2): qty 1

## 2018-08-09 MED ORDER — POLYETHYLENE GLYCOL 3350 17 G PO PACK: 17.0000 g | PACK | Freq: Every day | ORAL | Status: DC | PRN

## 2018-08-09 MED ORDER — FOLIC ACID 1 MG PO TABS
1.0000 mg | ORAL_TABLET | Freq: Every day | ORAL | Status: DC
  Administered 2018-08-09 – 2018-08-12 (×4): 1 mg via ORAL
  Filled 2018-08-09 (×4): qty 1

## 2018-08-09 MED ORDER — SODIUM CHLORIDE 0.9% FLUSH: 3.0000 mL | Freq: Once | INTRAVENOUS | Status: DC

## 2018-08-09 MED ORDER — SODIUM CHLORIDE 0.9% FLUSH: 10.0000 mL | INTRAVENOUS | Status: DC | PRN

## 2018-08-09 MED ORDER — SODIUM CHLORIDE 0.9% FLUSH: 10.0000 mL | Freq: Two times a day (BID) | INTRAVENOUS | Status: DC

## 2018-08-09 MED ORDER — HYDROMORPHONE HCL 1 MG/ML IJ SOLN
0.5000 mg | Freq: Once | INTRAMUSCULAR | Status: AC
  Administered 2018-08-09: 0.5 mg via SUBCUTANEOUS
  Filled 2018-08-09: qty 1

## 2018-08-09 MED ORDER — OXYCODONE HCL ER 15 MG PO T12A
15.0000 mg | EXTENDED_RELEASE_TABLET | Freq: Once | ORAL | Status: AC
  Administered 2018-08-09: 15 mg via ORAL
  Filled 2018-08-09: qty 1

## 2018-08-09 MED ORDER — OXYCODONE HCL 5 MG PO TABS
20.0000 mg | ORAL_TABLET | ORAL | Status: DC
  Administered 2018-08-09 (×3): 20 mg via ORAL
  Filled 2018-08-09 (×4): qty 4

## 2018-08-09 MED ORDER — HYDROMORPHONE HCL 2 MG/ML IJ SOLN: 2.0000 mg | INTRAMUSCULAR | Status: DC | PRN

## 2018-08-09 MED ORDER — VITAMIN B-12 1000 MCG PO TABS: 2000.0000 ug | ORAL_TABLET | Freq: Every day | ORAL | Status: DC

## 2018-08-09 MED ORDER — HYDROMORPHONE HCL 2 MG/ML IJ SOLN
2.0000 mg | Freq: Once | INTRAMUSCULAR | Status: AC
  Administered 2018-08-09: 2 mg via SUBCUTANEOUS
  Filled 2018-08-09: qty 1

## 2018-08-09 MED ORDER — ENOXAPARIN SODIUM 40 MG/0.4ML ~~LOC~~ SOLN
40.0000 mg | SUBCUTANEOUS | Status: DC
  Administered 2018-08-10: 40 mg via SUBCUTANEOUS
  Filled 2018-08-09 (×2): qty 0.4

## 2018-08-09 MED ORDER — VITAMIN D 25 MCG (1000 UNIT) PO TABS
2000.0000 [IU] | ORAL_TABLET | Freq: Every day | ORAL | Status: DC
  Administered 2018-08-09 – 2018-08-12 (×4): 2000 [IU] via ORAL
  Filled 2018-08-09 (×4): qty 2

## 2018-08-09 MED ORDER — SENNOSIDES-DOCUSATE SODIUM 8.6-50 MG PO TABS
1.0000 | ORAL_TABLET | Freq: Two times a day (BID) | ORAL | Status: DC
  Administered 2018-08-09: 1 via ORAL
  Filled 2018-08-09 (×3): qty 1

## 2018-08-09 MED ORDER — HYDROXYUREA 500 MG PO CAPS: 1500.0000 mg | ORAL_CAPSULE | Freq: Every day | ORAL | Status: DC

## 2018-08-09 MED ORDER — DEXTROSE-NACL 5-0.45 % IV SOLN
INTRAVENOUS | Status: DC
  Administered 2018-08-09 – 2018-08-11 (×5): via INTRAVENOUS

## 2018-08-09 MED ORDER — SODIUM CHLORIDE 0.9 % IV BOLUS
1000.0000 mL | Freq: Once | INTRAVENOUS | Status: AC
  Administered 2018-08-09: 1000 mL via INTRAVENOUS

## 2018-08-09 NOTE — ED Notes (Signed)
This nurse informed tech of pt inpatient room assignment and that pt is ready to be transported to floor,

## 2018-08-09 NOTE — ED Notes (Signed)
Pt said he would try to provide urine specimen instead of foley cath.

## 2018-08-09 NOTE — ED Triage Notes (Signed)
Patient here from home with complaints of SCC. Also reports urinary retention, unable to urinate for 3 hours.

## 2018-08-09 NOTE — Progress Notes (Signed)
Pt refused lovenox injection stating "I am in too much pain and have gotten too many shots already". Pt educated on importance of injection but still continued to refuse.

## 2018-08-09 NOTE — ED Notes (Signed)
Attempted to call  Nursing report, nurse not available at this time, informed will call back.

## 2018-08-09 NOTE — ED Notes (Signed)
EDP at bedside  

## 2018-08-09 NOTE — ED Notes (Signed)
PT have been made aware of urine sample while in triage. PT unable to provide urine sample at this time due to retention

## 2018-08-09 NOTE — ED Provider Notes (Signed)
I received this patient in signout from overnight team.  He had presented with sickle cell pain crisis and we were awaiting completion of lab work as well as pain control.  Lab work shows persistent CKD at 2.48, hemoglobin is 6.8 today, [place] around 7.  WBC 14.4, UA without infection.  UDS notable for cocaine positive.  I have given the patient his home OxyContin as well as 3 doses of subcutaneous Dilaudid as recommended in his chart.  He has continued to have pain improvement.  Discussed with sickle cell team, [place] Hollis, who agreed with plan to hold off on blood transfusion. They will admit for further treatment.   Maxime Beckner, Wenda Overland, MD 08/09/18 1236

## 2018-08-09 NOTE — ED Notes (Signed)
Date and time results received: 08/09/18 0740 (use smartphrase ".now" to insert current time)  Test: HGB Critical Value: 6.8  Name of Provider Notified: Little

## 2018-08-09 NOTE — H&P (Signed)
H&P  Patient Demographics:  [name] [name], is a 38 y.o. male  MRN: [id number]   DOB - [birth year]  Admit Date - 08/09/2018  Outpatient Primary MD for the patient is [name], MD  Chief Complaint  Patient presents with  . Sickle Cell Pain Crisis  . Urinary Retention      HPI:   [name] [name]  is a 38 y.o. male with a medical history significant for sickle cell disease, chronic kidney disease stage III/nephrotic syndrome, chronic pain syndrome, opiate dependence, and history of polysubstance abuse presented to emergency department for evaluation of increased generalized pain that is consistent with typical sickle cell pain crisis.  Patient was in usual state of health until yesterday when he developed low back and lower extremity pain.  Currently pain intensity is 10/10 characterized as constant and sharp.  Also patient reports that he had the urge to urinate without the ability to empty his bladder.  Patient states that he has been able to empty bladder in ER.  He denies a history of urinary retention, however patient has sickle cell related nephropathy and stage III chronic kidney disease. Patient denies fever, chills, recent travel, sick contacts, or exposure to COVID-19. Patient denies headache, dizziness, paresthesias, dysuria, nausea, vomiting, or diarrhea.  ER course: Urinalysis shows protein greater than 300, serum creatinine 2.48, alkaline phosphatase 182, and bilirubin 2.0.  Hemoglobin 6.8, WBCs 14.4, and platelets 116.  Patient afebrile.  Tachycardia noted HR 110-130s. Urine drug screen positive for cocaine.  COVID-19 negative. Pain persists despite several doses of Dilaudid SQ and OxyContin 15 mg.  Patient admitted to telemetry for sickle cell pain crisis.   Review of systems:  In addition to the HPI above, patient reports Review of Systems  Constitutional: Negative for chills and fever.  HENT: Negative.   Eyes: Negative for blurred vision.  Respiratory:  Negative for cough, sputum production and shortness of breath.   Cardiovascular: Negative for chest pain.  Gastrointestinal: Negative for abdominal pain, diarrhea, nausea and vomiting.  Genitourinary: Positive for dysuria and urgency.  Musculoskeletal: Positive for back pain and joint pain.  Skin: Negative.   Neurological: Negative for dizziness, tingling and headaches.  Psychiatric/Behavioral: Positive for substance abuse. Negative for depression and suicidal ideas. The patient is not nervous/anxious.     No significant Mental Stressors  A full 10 point Review of Systems was done, except as stated above, all other Review of Systems were negative.  With Past History of the following :   Past Medical History:  Diagnosis Date  . Arachnoid Cyst of brain bilaterally    "2 really small ones in the back of my head; inside; saw them w/MRI" (09/25/2012)  . Bacterial pneumonia ~ 2012   "caught it here in the hospital" (09/25/2012)  . Chronic kidney disease    "from my sickle cell" (09/25/2012)  . CKD (chronic kidney disease), stage II   . GERD (gastroesophageal reflux disease)    "after I eat alot of spicey foods" (09/25/2012)  . Gynecomastia, male 07/10/2012  . History of blood transfusion    "always related to sickle cell crisis" (09/25/2012)  . 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   Noted in noted from Hemat[facility] at [facility]  . Migraines    "take RX qd to prevent them" (09/25/2012)  . Opioid dependence with withdrawal ([place]) 08/30/2012  . Sickle cell anemia (HCC)   . Sickle cell crisis ([place]) 09/25/2012  . Sickle cell nephropathy ([place]) 07/10/2012  . Sinus tachycardia   .  Tachycardia with heart rate 121-140 beats per minute with ambulation 08/04/2016      Past Surgical History:  Procedure Laterality Date  . CHOLECYSTECTOMY  ~ 2012  . COLONOSCOPY N/A 04/23/2017   Procedure: COLONOSCOPY;  Surgeon: Irene Shipper, MD;  Location: [facility];  Service: Endoscopy;   Laterality: N/A;  . IR FLUORO GUIDE CV LINE RIGHT  12/17/2016  . IR REMOVAL TUN CV CATH W/O FL  12/21/2016  . IR US GUIDE VASC ACCESS RIGHT  12/17/2016  . spleenectomy       Social History:   Social History   Tobacco Use  . Smoking status: Current Some Day Smoker    Packs/day: 0.10    Types: Cigarettes  . Smokeless tobacco: Never Used  . Tobacco comment: 09/25/2012 "I don't buy cigarettes; bum one from friends q now and then"  Substance Use Topics  . Alcohol use: Yes    Alcohol/week: 0.0 standard drinks    Frequency: Never    Comment: Once a month     Lives - At home   Family History :   Family History  Problem Relation Age of Onset  . Breast cancer Mother      Home Medications:   Prior to Admission medications   Medication Sig Start Date End Date Taking? Authorizing Provider  cholecalciferol (VITAMIN D3) 25 MCG (1000 UT) tablet Take 2,000 Units by mouth daily.   Yes [provider]  [facility] VITAMIN B12 1000 MCG tablet Take 2,000 mcg by mouth daily. 06/21/18  Yes [provider]  folic acid (FOLVITE) 1 MG tablet Take 1 tablet (1 mg total) daily by mouth. 12/22/16  Yes Leana Gamer, MD  hydroxyurea (HYDREA) 500 MG capsule TAKE 3 CAPSULES BY MOUTH DAILY Patient taking differently: Take 1,500 mg by mouth daily.  06/19/18  Yes Lanae Boast, FNP  nortriptyline (PAMELOR) 25 MG capsule Take 1 capsule (25 mg total) by mouth at bedtime as needed for sleep. 06/10/18  Yes Donnamae Jude, MD  oxyCODONE (OXYCONTIN) 15 mg 12 hr tablet Take 1 tablet (15 mg total) by mouth every 12 (twelve) hours. 07/30/18  Yes [name], FNP  Oxycodone HCl 10 MG TABS Take 1 tablet (10 mg total) by mouth every 6 (six) hours as needed for severe pain. 07/30/18  Yes [name], FNP  predniSONE (DELTASONE) 20 MG tablet Take 20 mg by mouth 3 (three) times daily.  06/26/18  Yes [provider]  ergocalciferol (DRISDOL) 1.25 MG (50000 UT) capsule Take 1 capsule (50,000 Units  total) by mouth once a week. Patient not taking: Reported on 08/09/2018 04/05/18   Lanae Boast, FNP     Allergies:   Allergies  Allergen Reactions  . Nsaids Other (See Comments)    Kidney disease  . Morphine And Related Other (See Comments)    "real bad headaches"     Physical Exam:   Vitals:   Vitals:   08/09/18 1130 08/09/18 1200  BP: (!) 121/98 (!) 121/102  Pulse: (!) 127 (!) 120  Resp: (!) 25 (!) 21  Temp:    SpO2: 97% 94%   Physical Exam Constitutional:      General: He is in acute distress (Writhing in pain).  HENT:     Head: Normocephalic.     Mouth/Throat:     Mouth: Mucous membranes are moist.     Pharynx: Oropharynx is clear.  Eyes:     General: Scleral icterus present.     Pupils: Pupils  are equal, round, and reactive to light.  Cardiovascular:     Rate and Rhythm: Regular rhythm. Tachycardia present.  Pulmonary:     Effort: Pulmonary effort is normal.     Breath sounds: Normal breath sounds.  Abdominal:     General: Abdomen is flat. Bowel sounds are normal. There is no distension.     Tenderness: There is no abdominal tenderness.  Skin:    General: Skin is warm.  Neurological:     General: No focal deficit present.     Mental Status: Mental status is at baseline.  Psychiatric:        Mood and Affect: Affect is angry.        Speech: Speech normal.        Behavior: Behavior normal.        Thought Content: Thought content normal.        Judgment: Judgment normal.      Data Review:   CBC Recent Labs  Lab 08/09/18 0630  WBC 14.4*  HGB 6.8*  HCT 19.9*  PLT 116*  MCV 112.4*  MCH 38.4*  MCHC 34.2  RDW Not Measured  LYMPHSABS 1.7  MONOABS 0.7  EOSABS 0.0  BASOSABS 0.0   ------------------------------------------------------------------------------------------------------------------  Chemistries  Recent Labs  Lab 08/09/18 0630  NA 140  K 4.3  CL 107  CO2 24  GLUCOSE 136*  BUN 56*  CREATININE 2.48*  CALCIUM 8.4*  AST 40   ALT 85*  ALKPHOS 182*  BILITOT 2.0*   ------------------------------------------------------------------------------------------------------------------ estimated creatinine clearance is 33.5 mL/min (A) (by C-G formula based on SCr of 2.48 mg/dL (H)). ------------------------------------------------------------------------------------------------------------------ No results for input(s): TSH, T4TOTAL, T3FREE, THYROIDAB in the last 72 hours.  Invalid input(s): FREET3  Coagulation profile No results for input(s): INR, PROTIME in the last 168 hours. ------------------------------------------------------------------------------------------------------------------- No results for input(s): DDIMER in the last 72 hours. -------------------------------------------------------------------------------------------------------------------  Cardiac Enzymes No results for input(s): CKMB, TROPONINI, MYOGLOBIN in the last 168 hours.  Invalid input(s): CK ------------------------------------------------------------------------------------------------------------------ No results found for: BNP  ---------------------------------------------------------------------------------------------------------------  Urinalysis    Component Value Date/Time   COLORURINE YELLOW 08/09/2018 [place] 08/09/2018 0535   LABSPEC 1.009 08/09/2018 0535   PHURINE 7.0 08/09/2018 0535   GLUCOSEU >=500 (A) 08/09/2018 0535   HGBUR SMALL (A) 08/09/2018 0535   HGBUR small 03/10/2010 1445   BILIRUBINUR NEGATIVE 08/09/2018 0535   BILIRUBINUR neg 07/30/2018 1511   KETONESUR NEGATIVE 08/09/2018 0535   PROTEINUR >=300 (A) 08/09/2018 0535   UROBILINOGEN 0.2 07/30/2018 1511   UROBILINOGEN 0.2 05/11/2017 1335   NITRITE NEGATIVE 08/09/2018 0535   LEUKOCYTESUR NEGATIVE 08/09/2018 0535    ----------------------------------------------------------------------------------------------------------------    Imaging Results:    No results found.    Assessment & Plan:  Principal Problem:   Sickle cell pain crisis (HCC) Active Problems:   Tachycardia   Polysubstance abuse (HCC)   Chronic pain syndrome   CKD (chronic kidney disease), stage III (HCC)  Sickle cell disease with pain crisis: Pain persists despite opiate medications. Admit, start IV fluids D5 0.45% saline at 75 mL/h Urine positive for cocaine.  Refrain from IV pain medication. Oxycodone 20 mg every 4 hours while patient is awake. Dilaudid 2 mg SQ every 4 hours as needed for moderate to severe breakthrough pain. Hold IV Toradol due to history of stage III chronic kidney disease Maintain oxygen saturation above 90%, 2 L supplemental oxygen as needed No sign of acute chest syndrome, continue to monitor closely Continue folic acid and hydroxyurea  Sickle cell anemia: Hemoglobin 6.8.  No clinical indication for blood transfusion on today.  Patient typically transfused if hemoglobin is less than 6.0 or symptomatic.  Chronic pain syndrome: Continue home medications.  OxyContin 15 mg every 12 hours.    Leukocytosis: WBCs elevated at 14.4.  Patient afebrile.  No signs of infection or inflammation.  Suspected to be reactive.  Continue to follow CBC.  Urinary retention: Continue foley catheter and gentle hydration.  Reviewed urinalysis, no signs of infection.  Gentle hydration, and strict input and output.  Stage III chronic kidney disease: Creatinine 2.4, which is improved from previous.  2.4 is consistent with patient's baseline.  He is under the care of outpatient nephrology. Continue prednisone 20 mg 3 times daily  Cocaine abuse: Urine positive for cocaine.  Patient was counseled at length.  He states that he does not know how his urine is positive for cocaine it is been greater than a week since he is used it.  Patient was evaluated in primary care 1 week ago and drug screen was obtained, which was negative for  cocaine.  DVT Prophylaxis: Subcut Lovenox   AM Labs Ordered, also please review Full Orders  Family Communication: Admission, patient's condition and plan of care including tests being ordered have been discussed with the patient who indicate understanding and agree with the plan and Code Status.  Code Status: Full Code  Consults called: None    Admission status: Inpatient    Time spent in minutes : 50 minutes  [place], MSN, FNP-C [facility] [address] [place], [place] [address] [phone]  08/09/2018 at 12:35 PM

## 2018-08-09 NOTE — ED Provider Notes (Signed)
[facility] DEPT [facility] Provider Note: [name] Spurling, MD, FACEP  CSN: [id number] MRN: [id number] ARRIVAL: 08/09/18 at 0331 ROOM: 1409/1409-01   CHIEF COMPLAINT  Sickle Cell Pain Crisis and Urinary Retention   HISTORY OF PRESENT ILLNESS  08/09/18 5:16 AM [name] [name]. is a 38 y.o. male who complains of generalized pain, most prominent in his legs and back, which began about 11 PM yesterday evening.  Pain is a 10 out of 10, consistent with previous episodes of sickle cell crisis.  He also reports the urge to urinate without the ability to void his bladder.  This is been going on for several hours.  He denies any history of urinary retention but feels like the urine in his bladder is backing up into his kidneys.  He has not had a fever but was noted to be tachycardic on arrival.   Past Medical History:  Diagnosis Date  . Arachnoid Cyst of brain bilaterally    "2 really small ones in the back of my head; inside; saw them w/MRI" (09/25/2012)  . Bacterial pneumonia ~ 2012   "caught it here in the hospital" (09/25/2012)  . Chronic kidney disease    "from my sickle cell" (09/25/2012)  . CKD (chronic kidney disease), stage II   . GERD (gastroesophageal reflux disease)    "after I eat alot of spicey foods" (09/25/2012)  . Gynecomastia, male 07/10/2012  . History of blood transfusion    "always related to sickle cell crisis" (09/25/2012)  . 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   Noted in noted from [facility]  . Migraines    "take RX qd to prevent them" (09/25/2012)  . Opioid dependence with withdrawal ([place]) 08/30/2012  . Sickle cell anemia (HCC)   . Sickle cell crisis ([place]) 09/25/2012  . Sickle cell nephropathy ([place]) 07/10/2012  . Sinus tachycardia   . Tachycardia with heart rate 121-140 beats per minute with ambulation 08/04/2016    Past Surgical History:  Procedure Laterality Date  . CHOLECYSTECTOMY  ~ 2012  . COLONOSCOPY N/A 04/23/2017   Procedure: COLONOSCOPY;  Surgeon: Irene Shipper, MD;  Location: [facility];  Service: Endoscopy;  Laterality: N/A;  . IR FLUORO GUIDE CV LINE RIGHT  12/17/2016  . IR REMOVAL TUN CV CATH W/O FL  12/21/2016  . IR US GUIDE VASC ACCESS RIGHT  12/17/2016  . spleenectomy      Family History  Problem Relation Age of Onset  . Breast cancer Mother     Social History   Tobacco Use  . Smoking status: Current Some Day Smoker    Packs/day: 0.10    Types: Cigarettes  . Smokeless tobacco: Never Used  . Tobacco comment: 09/25/2012 "I don't buy cigarettes; bum one from friends q now and then"  Substance Use Topics  . Alcohol use: Yes    Alcohol/week: 0.0 standard drinks    Frequency: Never    Comment: Once a month  . Drug use: Not Currently    Types: Marijuana    Comment: Every 2-3 weeks     Prior to Admission medications   Medication Sig Start Date End Date Taking? Authorizing Provider  cholecalciferol (VITAMIN D3) 25 MCG (1000 UT) tablet Take 2,000 Units by mouth daily.   Yes [provider]  [facility]S VITAMIN B12 1000 MCG tablet Take 2,000 mcg by mouth daily. 06/21/18  Yes [provider]  folic acid (FOLVITE) 1 MG tablet Take 1 tablet (1 mg total) daily by mouth. 12/22/16  Yes Leana Gamer, MD  hydroxyurea (HYDREA) 500 MG capsule TAKE 3 CAPSULES BY MOUTH DAILY Patient taking differently: Take 1,500 mg by mouth daily.  06/19/18  Yes Lanae Boast, FNP  nortriptyline (PAMELOR) 25 MG capsule Take 1 capsule (25 mg total) by mouth at bedtime as needed for sleep. 06/10/18  Yes Donnamae Jude, MD  oxyCODONE (OXYCONTIN) 15 mg 12 hr tablet Take 1 tablet (15 mg total) by mouth every 12 (twelve) hours. 07/30/18  Yes [name], FNP  Oxycodone HCl 10 MG TABS Take 1 tablet (10 mg total) by mouth every 6 (six) hours as needed for severe pain. 07/30/18  Yes [name], FNP  predniSONE (DELTASONE) 20 MG tablet Take 20 mg by mouth 3 (three) times daily.  06/26/18  Yes [provider]  ergocalciferol (DRISDOL) 1.25 MG (50000 UT) capsule Take 1 capsule (50,000 Units total) by mouth once a week. Patient not taking: Reported on 08/09/2018 04/05/18   Lanae Boast, FNP    Allergies Nsaids and Morphine and related   REVIEW OF SYSTEMS  Negative except as noted here or in the History of Present Illness.   PHYSICAL EXAMINATION  Initial Vital Signs Blood pressure 132/78, pulse (!) 132, temperature 98.9 F (37.2 C), temperature source Oral, resp. rate 19, SpO2 100 %.  Examination General: Well-developed, cachectic male in no acute distress; appears older than age of record HENT: normocephalic; atraumatic Eyes: pupils equal, round and reactive to light; extraocular muscles intact; scleral icterus Neck: supple Heart: regular rate and rhythm; tachycardia Lungs: clear to auscultation bilaterally Abdomen: soft; nondistended; distended, tender bladder; bowel sounds present Extremities: No deformity; full range of motion; pulses normal Neurologic: Awake, alert and oriented; motor function intact in all extremities and symmetric; no facial droop Skin: Warm and dry Psychiatric: Grimacing   RESULTS  Summary of this visit's results, reviewed by myself:   EKG Interpretation  Date/Time:  Friday August 09 2018 06:04:21 EDT Ventricular Rate:  121 PR Interval:    QRS Duration: 78 QT Interval:  293 QTC Calculation: 416 R Axis:   61 Text Interpretation:  Sinus tachycardia Nonspecific T abnormalities, lateral leads Rate is faster Confirmed by Shanon Rosser [phone]) on 08/09/2018 6:24:47 AM      Laboratory Studies: Results for orders placed or performed during the hospital encounter of 08/09/18 (from the past 24 hour(s))  Rapid urine drug screen (hospital performed)     Status: Abnormal   Collection Time: 08/09/18  5:18 AM  Result Value Ref Range   Opiates POSITIVE (A) NONE DETECTED   Cocaine POSITIVE (A) NONE DETECTED   Benzodiazepines NONE DETECTED NONE  DETECTED   Amphetamines NONE DETECTED NONE DETECTED   Tetrahydrocannabinol NONE DETECTED NONE DETECTED   Barbiturates NONE DETECTED NONE DETECTED  Urinalysis, Routine w reflex microscopic     Status: Abnormal   Collection Time: 08/09/18  5:35 AM  Result Value Ref Range   Color, Urine YELLOW YELLOW   APPearance CLEAR CLEAR   Specific Gravity, Urine 1.009 1.005 - 1.030   pH 7.0 5.0 - 8.0   Glucose, UA >=500 (A) NEGATIVE mg/dL   Hgb urine dipstick SMALL (A) NEGATIVE   Bilirubin Urine NEGATIVE NEGATIVE   Ketones, ur NEGATIVE NEGATIVE mg/dL   Protein, ur >=300 (A) NEGATIVE mg/dL   Nitrite NEGATIVE NEGATIVE   Leukocytes,Ua NEGATIVE NEGATIVE   RBC / HPF 6-10 0 - 5 RBC/hpf   WBC, UA 0-5 0 - 5 WBC/hpf   Bacteria, UA RARE (A) NONE  SEEN   Squamous Epithelial / LPF 0-5 0 - 5   Mucus PRESENT   Comprehensive metabolic panel     Status: Abnormal   Collection Time: 08/09/18  6:30 AM  Result Value Ref Range   Sodium 140 135 - 145 mmol/L   Potassium 4.3 3.5 - 5.1 mmol/L   Chloride 107 98 - 111 mmol/L   CO2 24 22 - 32 mmol/L   Glucose, Bld 136 (H) 70 - 99 mg/dL   BUN 56 (H) 6 - 20 mg/dL   Creatinine, Ser 2.48 (H) 0.61 - 1.24 mg/dL   Calcium 8.4 (L) 8.9 - 10.3 mg/dL   Total Protein 5.2 (L) 6.5 - 8.1 g/dL   Albumin 2.7 (L) 3.5 - 5.0 g/dL   AST 40 15 - 41 U/L   ALT 85 (H) 0 - 44 U/L   Alkaline Phosphatase 182 (H) 38 - 126 U/L   Total Bilirubin 2.0 (H) 0.3 - 1.2 mg/dL   GFR calc non Af Amer 32 (L) >60 mL/min   GFR calc Af Amer 38 (L) >60 mL/min   Anion gap 9 5 - 15  CBC with Differential     Status: Abnormal   Collection Time: 08/09/18  6:30 AM  Result Value Ref Range   WBC 14.4 (H) 4.0 - 10.5 K/uL   RBC 1.77 (L) 4.22 - 5.81 MIL/uL   Hemoglobin 6.8 (LL) 13.0 - 17.0 g/dL   HCT 19.9 (L) 39.0 - 52.0 %   MCV 112.4 (H) 80.0 - 100.0 fL   MCH 38.4 (H) 26.0 - 34.0 pg   MCHC 34.2 30.0 - 36.0 g/dL   RDW Not Measured 11.5 - 15.5 %   Platelets 116 (L) 150 - 400 K/uL   nRBC 2.5 (H) 0.0 - 0.2 %    Neutrophils Relative % 81 %   Neutro Abs 11.7 (H) 1.7 - 7.7 K/uL   Lymphocytes Relative 12 %   Lymphs Abs 1.7 0.7 - 4.0 K/uL   Monocytes Relative 5 %   Monocytes Absolute 0.7 0.1 - 1.0 K/uL   Eosinophils Relative 0 %   Eosinophils Absolute 0.0 0.0 - 0.5 K/uL   Basophils Relative 0 %   Basophils Absolute 0.0 0.0 - 0.1 K/uL   Immature Granulocytes 2 %   Abs Immature Granulocytes 0.21 (H) 0.00 - 0.07 K/uL   Sickle Cells PRESENT    Target Cells PRESENT   Reticulocytes     Status: Abnormal   Collection Time: 08/09/18  6:30 AM  Result Value Ref Range   Retic Ct Pct 3.7 (H) 0.4 - 3.1 %   RBC. 1.77 (L) 4.22 - 5.81 MIL/uL   Retic Count, Absolute 65.1 19.0 - 186.0 K/uL   Immature Retic Fract 30.2 (H) 2.3 - 15.9 %  SARS Coronavirus 2 (CEPHEID - Performed in [place] hospital lab), Hosp Order     Status: None   Collection Time: 08/09/18  1:20 PM   Specimen: Nasopharyngeal Swab  Result Value Ref Range   SARS Coronavirus 2 NEGATIVE NEGATIVE   Imaging Studies: No results found.  ED COURSE and MDM  Nursing notes and initial vitals signs, including pulse oximetry, reviewed.  Vitals:   08/09/18 1300 08/09/18 1412 08/09/18 1607 08/09/18 2027  BP: (!) 130/94 (!) 134/95 (!) 151/84 129/87  Pulse: (!) 123 (!) 124 (!) 119 (!) 122  Resp: 14 20 16 18   Temp:  98 F (36.7 C) 99 F (37.2 C) 99.7 F (37.6 C)  TempSrc:  Oral Oral  Oral  SpO2: 99% 100% 100% 100%  Weight:      Height:       6:24 AM Sickle cell protocol initiated in triage.  IV fluid bolus and subcutaneous Dilaudid ordered (patient has a flag from the sickle cell clinic advising against IV narcotics due to cocaine abuse).  Patient has over 300 mL of urine in his bladder and is unable to void but Foley catheter is pending.  6:36 AM Foley catheter placed.  Over 800 mL of urine obtained so far.  IV team at bedside attempting line placement.  PROCEDURES    ED DIAGNOSES     ICD-10-CM   1. Sickle cell pain crisis ([place])  D57.00    2. Acute urinary retention  R33.8   3. Cocaine abuse ([place])  F14.10        Macaela Presas, Jenny Reichmann, MD 08/09/18 2237

## 2018-08-09 NOTE — Progress Notes (Signed)
PHARMACY BRIEF NOTE: HYDROXYUREA   By [facility] policy, hydroxyurea is automatically held when any of the following laboratory values occur:  ANC < 2 K  Pltc < 80K in sickle-cell patients; < 100K in other patients  Hgb <= 6 in sickle-cell patients; < 8 in other patients  Reticulocytes < 80K when Hgb < 9 (retic 65K , hgb 6.8)  Hydroxyurea has been held (discontinued from profile) per policy.   Dia Sitter, PharmD, BCPS 08/09/2018 2:07 PM

## 2018-08-09 NOTE — ED Notes (Signed)
EKG given to [facility]. For review.

## 2018-08-09 NOTE — ED Notes (Signed)
Pt angry that pain medication is not being administered IV route, requesting to speak to MD, EDP notified.

## 2018-08-09 NOTE — Progress Notes (Signed)
Pts mother called and asked this RN to tell her specific lab values of patient. This RN instructed family member that they need to discuss those lab values with the physician. Family member became verbally aggressive shouting and talking over this RN. The patient is alert and oriented and able to make decisions for himself and has his phone at his bedside to call and update family as desired. Sickle cell MD on call paged to notify that family would like to be informed. Will continue to monitor.

## 2018-08-09 NOTE — ED Notes (Signed)
IV team called, said it would be at least 30 minutes before they could respond to this order

## 2018-08-10 DIAGNOSIS — R338 Other retention of urine: Secondary | ICD-10-CM

## 2018-08-10 LAB — CBC WITH DIFFERENTIAL/PLATELET
Abs Immature Granulocytes: 0.16 10*3/uL — ABNORMAL HIGH (ref 0.00–0.07)
Basophils Absolute: 0 10*3/uL (ref 0.0–0.1)
Basophils Relative: 0 %
Eosinophils Absolute: 0 10*3/uL (ref 0.0–0.5)
Eosinophils Relative: 0 %
HCT: 20.1 % — ABNORMAL LOW (ref 39.0–52.0)
Hemoglobin: 6.7 g/dL — CL (ref 13.0–17.0)
Immature Granulocytes: 2 %
Lymphocytes Relative: 12 %
Lymphs Abs: 1.2 10*3/uL (ref 0.7–4.0)
MCH: 38.1 pg — ABNORMAL HIGH (ref 26.0–34.0)
MCHC: 33.3 g/dL (ref 30.0–36.0)
MCV: 114.2 fL — ABNORMAL HIGH (ref 80.0–100.0)
Monocytes Absolute: 0.4 10*3/uL (ref 0.1–1.0)
Monocytes Relative: 4 %
Neutro Abs: 8.5 10*3/uL — ABNORMAL HIGH (ref 1.7–7.7)
Neutrophils Relative %: 82 %
Platelets: 106 10*3/uL — ABNORMAL LOW (ref 150–400)
RBC: 1.76 MIL/uL — ABNORMAL LOW (ref 4.22–5.81)
WBC: 10.4 10*3/uL (ref 4.0–10.5)
nRBC: 6.5 % — ABNORMAL HIGH (ref 0.0–0.2)

## 2018-08-10 LAB — COMPREHENSIVE METABOLIC PANEL
ALT: 81 U/L — ABNORMAL HIGH (ref 0–44)
AST: 45 U/L — ABNORMAL HIGH (ref 15–41)
Albumin: 2.6 g/dL — ABNORMAL LOW (ref 3.5–5.0)
Alkaline Phosphatase: 174 U/L — ABNORMAL HIGH (ref 38–126)
Anion gap: 11 (ref 5–15)
BUN: 37 mg/dL — ABNORMAL HIGH (ref 6–20)
CO2: 20 mmol/L — ABNORMAL LOW (ref 22–32)
Calcium: 8.4 mg/dL — ABNORMAL LOW (ref 8.9–10.3)
Chloride: 105 mmol/L (ref 98–111)
Creatinine, Ser: 2.18 mg/dL — ABNORMAL HIGH (ref 0.61–1.24)
GFR calc Af Amer: 44 mL/min — ABNORMAL LOW (ref >60)
GFR calc non Af Amer: 38 mL/min — ABNORMAL LOW (ref >60)
Glucose, Bld: 111 mg/dL — ABNORMAL HIGH (ref 70–99)
Potassium: 4.5 mmol/L (ref 3.5–5.1)
Sodium: 136 mmol/L (ref 135–145)
Total Bilirubin: 2.3 mg/dL — ABNORMAL HIGH (ref 0.3–1.2)
Total Protein: 5.2 g/dL — ABNORMAL LOW (ref 6.5–8.1)

## 2018-08-10 MED ORDER — ACETAMINOPHEN 325 MG PO TABS
650.0000 mg | ORAL_TABLET | Freq: Four times a day (QID) | ORAL | Status: DC | PRN
  Administered 2018-08-10 – 2018-08-12 (×2): 650 mg via ORAL
  Filled 2018-08-10 (×2): qty 2

## 2018-08-10 NOTE — Progress Notes (Signed)
RN checked on pt and pt was found to be rolling back and forth in bed and sweating with a HR remaining in 120s to 130s. This RN knows MD stated no PRN medications and no IV pain medications. RN paged on call Kennon Holter and spoke about if she was able to give anything to help and RN was advised pt cannot receive any other medications unless it is scheduled on the [facility]. Pt has already been given a K pad and offered multiple other options, such as a cold pack or pillows, with all being refused. This RN will continue to monitor the pt closely.

## 2018-08-10 NOTE — Progress Notes (Signed)
Patient ID: [name] [name]., male   DOB: Jan 15, 1981, 38 y.o.   MRN: [id number] Subjective: [name] [name]  is a 38 y.o. male with a medical history significant for sickle cell disease, chronic kidney disease stage III/nephrotic syndrome, chronic pain syndrome, opiate dependence, and history of polysubstance abuse who was admitted yesterday for sickle cell pain crisis.  Today, patient claims he is feeling much better, pain is now at 7/10.  His goal is below 5/10. Pain is mostly in his lower limbs and lower back.  He denies any fever, chest pain, cough, shortness of breath, urinary symptoms. No vomiting, no diarrhea, no constipation. Patient is tolerating p.o. intake.  Objective:  Vital signs in last 24 hours:  Vitals:   08/09/18 2356 08/10/18 0423 08/10/18 0806 08/10/18 1132  BP: 129/90 (!) 135/94 (!) 140/93 (!) 130/114  Pulse: (!) 121 (!) 122 (!) 122 (!) 121  Resp: 18 16 20 20   Temp: 99.5 F (37.5 C) 100.3 F (37.9 C) 99.3 F (37.4 C) 99.5 F (37.5 C)  TempSrc: Oral Oral Oral Oral  SpO2: 98% 100% 98% 97%  Weight:      Height:        Intake/Output from previous day:   Intake/Output Summary (Last 24 hours) at 08/10/2018 1336 Last data filed at 08/10/2018 1135 Gross per 24 hour  Intake 2687.67 ml  Output 3700 ml  Net -1012.33 ml    Physical Exam: General: Alert, awake, oriented x3, in no acute distress. Face is round and moon-like HEENT: [place]/AT PEERL, EOMI Neck: Trachea midline,  no masses, no thyromegal,y no JVD, no carotid bruit OROPHARYNX:  Moist, No exudate/ erythema/lesions.  Heart: Regular rate and rhythm, without murmurs, rubs, gallops, PMI non-displaced, no heaves or thrills on palpation.  Lungs: Clear to auscultation, no wheezing or rhonchi noted. No increased vocal fremitus resonant to percussion  Abdomen: Soft, nontender, nondistended, positive bowel sounds, no masses no hepatosplenomegaly noted..  Neuro: No focal neurological deficits noted cranial nerves II  through XII grossly intact. DTRs 2+ bilaterally upper and lower extremities. Strength 5 out of 5 in bilateral upper and lower extremities. Musculoskeletal: No warm swelling or erythema around joints, no spinal tenderness noted. Psychiatric: Patient alert and oriented x3, good insight and cognition, good recent to remote recall. Lymph node survey: No cervical axillary or inguinal lymphadenopathy noted.  Lab Results:  Basic Metabolic Panel:    Component Value Date/Time   NA 136 08/10/2018 0311   NA 138 06/13/2017 1102   K 4.5 08/10/2018 0311   CL 105 08/10/2018 0311   CO2 20 (L) 08/10/2018 0311   BUN 37 (H) 08/10/2018 0311   BUN 18 06/13/2017 1102   CREATININE 2.18 (H) 08/10/2018 0311   CREATININE 1.45 (H) 05/12/2014 1430   GLUCOSE 111 (H) 08/10/2018 0311   CALCIUM 8.4 (L) 08/10/2018 0311   CBC:    Component Value Date/Time   WBC 10.4 08/10/2018 0311   HGB 6.7 (LL) 08/10/2018 0311   HGB 7.8 (L) 03/28/2018 0942   HCT 20.1 (L) 08/10/2018 0311   HCT 22.8 (L) 03/28/2018 0942   PLT 106 (L) 08/10/2018 0311   PLT 267 03/28/2018 0942   MCV 114.2 (H) 08/10/2018 0311   MCV 115 (H) 03/28/2018 0942   NEUTROABS 8.5 (H) 08/10/2018 0311   NEUTROABS 2.2 03/28/2018 0942   LYMPHSABS 1.2 08/10/2018 0311   LYMPHSABS 3.3 (H) 03/28/2018 0942   MONOABS 0.4 08/10/2018 0311   EOSABS 0.0 08/10/2018 0311   EOSABS 0.1 03/28/2018 2440  BASOSABS 0.0 08/10/2018 0311   BASOSABS 0.0 03/28/2018 1610    Recent Results (from the past 240 hour(s))  SARS Coronavirus 2 (CEPHEID - Performed in [facility] hospital lab), Hosp Order     Status: None   Collection Time: 08/09/18  1:20 PM   Specimen: Nasopharyngeal Swab  Result Value Ref Range Status   SARS Coronavirus 2 NEGATIVE NEGATIVE Final    Comment: (NOTE) If result is NEGATIVE SARS-CoV-2 target nucleic acids are NOT DETECTED. The SARS-CoV-2 RNA is generally detectable in upper and lower  respiratory specimens during the acute phase of infection. The  lowest  concentration of SARS-CoV-2 viral copies this assay can detect is 250  copies / mL. A negative result does not preclude SARS-CoV-2 infection  and should not be used as the sole basis for treatment or other  patient management decisions.  A negative result may occur with  improper specimen collection / handling, submission of specimen other  than nasopharyngeal swab, presence of viral mutation(s) within the  areas targeted by this assay, and inadequate number of viral copies  (<250 copies / mL). A negative result must be combined with clinical  observations, patient history, and epidemiological information. If result is POSITIVE SARS-CoV-2 target nucleic acids are DETECTED. The SARS-CoV-2 RNA is generally detectable in upper and lower  respiratory specimens dur ing the acute phase of infection.  Positive  results are indicative of active infection with SARS-CoV-2.  Clinical  correlation with patient history and other diagnostic information is  necessary to determine patient infection status.  Positive results do  not rule out bacterial infection or co-infection with other viruses. If result is PRESUMPTIVE POSTIVE SARS-CoV-2 nucleic acids MAY BE PRESENT.   A presumptive positive result was obtained on the submitted specimen  and confirmed on repeat testing.  While 2019 novel coronavirus  (SARS-CoV-2) nucleic acids may be present in the submitted sample  additional confirmatory testing may be necessary for epidemiological  and / or clinical management purposes  to differentiate between  SARS-CoV-2 and other Sarbecovirus currently known to infect humans.  If clinically indicated additional testing with an alternate test  methodology [phone]) is advised. The SARS-CoV-2 RNA is generally  detectable in upper and lower respiratory sp ecimens during the acute  phase of infection. The expected result is Negative. Fact Sheet for Patients:   BoilerBrush.com.cy Fact Sheet for Healthcare Providers: https://pope.com/ This test is not yet approved or cleared by the [place] FDA and has been authorized for detection and/or diagnosis of SARS-CoV-2 by FDA under an Emergency Use Authorization (EUA).  This EUA will remain in effect (meaning this test can be used) for the duration of the COVID-19 declaration under Section 564(b)(1) of the Act, 21 U.S.C. section 360bbb-3(b)(1), unless the authorization is terminated or revoked sooner. Performed at [facility], [address] [place], [place] [address]     Studies/Results: No results found.  Medications: Scheduled Meds: . cholecalciferol  2,000 Units Oral Daily  . enoxaparin (LOVENOX) injection  40 mg Subcutaneous Q24H  . folic acid  1 mg Oral Daily  . oxyCODONE  20 mg Oral Q4H while awake  . oxyCODONE  15 mg Oral Q12H  . predniSONE  20 mg Oral TID  . senna-docusate  1 tablet Oral BID  . sodium chloride flush  10-40 mL Intracatheter Q12H  . sodium chloride flush  3 mL Intravenous Once  . vitamin B-12  2,000 mcg Oral Daily   Continuous Infusions: . dextrose 5 %  and 0.45% NaCl 75 mL/hr at 08/10/18 1051   PRN Meds:.HYDROmorphone (DILAUDID) injection, polyethylene glycol, sodium chloride flush  Assessment/Plan: Principal Problem:   Sickle cell pain crisis (HCC) Active Problems:   Tachycardia   Cocaine abuse (HCC)   Polysubstance abuse (HCC)   Chronic pain syndrome   CKD (chronic kidney disease), stage III (HCC)   Acute urinary retention   1. Hb Sickle Cell Disease with crisis: Continue IVF for gentle hydration, continue Dilaudid as needed for pain, IV Toradol is contraindicated due to CKD, Monitor vitals very closely, Re-evaluate pain scale regularly, 2 L of Oxygen by [place].  Patient claims pain is much improved, will continue to monitor 2. Acute kidney injury on top of CKD stage III: Patient is on IV  fluid for gentle rehydration, currently on steroid prescribed by nephrologist for his CKD. He has a follow-up appointment with nephrologist on July 14. Kidney function is improving. Continue prednisone 3. Leukocytosis: Resolved, will continue to monitor 4. Sickle Cell Anemia: Hb is 6.7 today, no medical indication for blood transfusion today.  Labs in a.m. 5. Chronic pain Syndrome: Continue oral pain medications.  Patient is said to be refusing his medications, claiming he is trying to wean himself off of opiates.  Patient counseled extensively about pain and nonpharmacologic means of pain management. 6. Cocaine use disorder: Patient counseled extensively about polysubstance abuse.  Code Status: Full Code Family Communication: N/A Disposition Plan: Not yet ready for discharge  Pacer Dorn  If 7PM-7AM, please contact night-coverage.  08/10/2018, 1:36 PM  LOS: 1 day

## 2018-08-10 NOTE — Progress Notes (Signed)
CRITICAL VALUE STICKER  CRITICAL VALUE: HGB 6.7  DATE & TIME NOTIFIED: 08/10/2018  0435  MESSENGER (representative from lab): Hans Eden  MD NOTIFIED: NP Blount   TIME OF NOTIFICATION: 3388  RESPONSE: No new orders  Will continue to monitor pt

## 2018-08-10 NOTE — Progress Notes (Signed)
RN went to pt room to administer morning dose of Oxy IR. Pt acknowledged the medication and said he was in pain. RN scanned medication and placed in medicine cup. Pt refused to take medication and stated " I don't understand why you all keep giving me narcotics when I am trying to come off of them. I am not going to take it. I don't want to take anything at all for pain". RN wasted medication in pyxis with another RN. Will continue to monitor the pt.

## 2018-08-11 LAB — CBC WITH DIFFERENTIAL/PLATELET
Abs Immature Granulocytes: 0.1 10*3/uL — ABNORMAL HIGH (ref 0.00–0.07)
Basophils Absolute: 0 10*3/uL (ref 0.0–0.1)
Basophils Relative: 0 %
Eosinophils Absolute: 0 10*3/uL (ref 0.0–0.5)
Eosinophils Relative: 0 %
HCT: 17.4 % — ABNORMAL LOW (ref 39.0–52.0)
Hemoglobin: 5.7 g/dL — CL (ref 13.0–17.0)
Immature Granulocytes: 2 %
Lymphocytes Relative: 5 %
Lymphs Abs: 0.3 10*3/uL — ABNORMAL LOW (ref 0.7–4.0)
MCH: 35 pg — ABNORMAL HIGH (ref 26.0–34.0)
MCHC: 32.8 g/dL (ref 30.0–36.0)
MCV: 106.7 fL — ABNORMAL HIGH (ref 80.0–100.0)
Monocytes Absolute: 0.2 10*3/uL (ref 0.1–1.0)
Monocytes Relative: 3 %
Neutro Abs: 6.1 10*3/uL (ref 1.7–7.7)
Neutrophils Relative %: 90 %
Platelets: 62 10*3/uL — ABNORMAL LOW (ref 150–400)
RBC: 1.63 MIL/uL — ABNORMAL LOW (ref 4.22–5.81)
RDW: 31.8 % — ABNORMAL HIGH (ref 11.5–15.5)
WBC: 6.7 10*3/uL (ref 4.0–10.5)
nRBC: 4.9 % — ABNORMAL HIGH (ref 0.0–0.2)

## 2018-08-11 LAB — BASIC METABOLIC PANEL
Anion gap: 12 (ref 5–15)
BUN: 43 mg/dL — ABNORMAL HIGH (ref 6–20)
CO2: 18 mmol/L — ABNORMAL LOW (ref 22–32)
Calcium: 8.7 mg/dL — ABNORMAL LOW (ref 8.9–10.3)
Chloride: 103 mmol/L (ref 98–111)
Creatinine, Ser: 2.42 mg/dL — ABNORMAL HIGH (ref 0.61–1.24)
GFR calc Af Amer: 39 mL/min — ABNORMAL LOW (ref >60)
GFR calc non Af Amer: 33 mL/min — ABNORMAL LOW (ref >60)
Glucose, Bld: 182 mg/dL — ABNORMAL HIGH (ref 70–99)
Potassium: 4.6 mmol/L (ref 3.5–5.1)
Sodium: 133 mmol/L — ABNORMAL LOW (ref 135–145)

## 2018-08-11 LAB — PREPARE RBC (CROSSMATCH)

## 2018-08-11 MED ORDER — SODIUM CHLORIDE 0.9% IV SOLUTION: Freq: Once | INTRAVENOUS | Status: DC

## 2018-08-11 NOTE — Progress Notes (Signed)
Blood transfusion completed, temp 100.6. Again, patient offered Tylenol per MD recommendation but refused. Educated and verbalized understanding.

## 2018-08-11 NOTE — Plan of Care (Signed)
Refuses diagnostic treatments such as temperature checks.   Refuses all medications, including Tylenol and pain meds.  Refusing to eat or drink.l

## 2018-08-11 NOTE — Progress Notes (Signed)
15 min into transfusion, Temp 100.3 offered tylenol but patient refused. He denies any other symptom including chest pain, back pain chills or SOB. I will continue to monitor. MD made aware.

## 2018-08-11 NOTE — Progress Notes (Signed)
Pt refused scheduled doses of Oxy IR and Oxycontin, states he doesn't want to take any narcotics.  Pt requesting Tylenol for his pain.  B[place] notified.

## 2018-08-11 NOTE — Progress Notes (Signed)
CRITICAL VALUE ALERT  Critical Value:  Hgb 5.7  Date & Time Notied:  08/11/18 @ 0715  Provider Notified: Dr Doreene Burke

## 2018-08-11 NOTE — Progress Notes (Signed)
Patient is resting quietly.   Is alert but non responsive verbally.   I've only been able to get him to say one or two words.   Mostly he is grunt a yes or now or shaking his head.   He is refusing all medications.  He is denying any pain.  He is refusing the Tylenol ordered for his temp post transfusion.   He is also now refusing to have his temperature taken at all, even axillary.  He did void 800 cc after I educated and pretty much begged him to try.  He seems to understand the ramifications of his decisions.

## 2018-08-11 NOTE — Progress Notes (Signed)
Patient ID: [name] Pick., male   DOB: Sep 05, 1980, 38 y.o.   MRN: [id number] Subjective: [name] [name]  is a 38 y.o. male with a medical history significant for sickle cell disease, chronic kidney disease stage III/nephrotic syndrome, chronic pain syndrome, opiate dependence, and history of polysubstance abuse who was admitted yesterday for sickle cell pain crisis.  Patient has been refusing his oral pain medications, claims that he is trying to wean himself off of the medication.  Hemoglobin has dropped to 5.7 this morning, patient denied any symptom but he usually get transfused with hemoglobin less than 6.  He has no new complaints today.  Pain still mainly to his lower limb and lower back.  He rates his pain at 6/10 today.  He denies any fever, chest pain, cough, shortness of breath, nausea, vomiting or diarrhea.  No constipation.  Objective:  Vital signs in last 24 hours:  Vitals:   08/10/18 1956 08/10/18 2350 08/11/18 0000 08/11/18 0412  BP: 123/83 (!) 142/103 (!) 142/96 116/90  Pulse: (!) 112 (!) 120 (!) 113 (!) 125  Resp: 18 15  16   Temp: 99.6 F (37.6 C) 99.8 F (37.7 C)  99.4 F (37.4 C)  TempSrc: Oral Oral  Oral  SpO2: 94% 93%  90%  Weight:      Height:        Intake/Output from previous day:   Intake/Output Summary (Last 24 hours) at 08/11/2018 1218 Last data filed at 08/11/2018 0931 Gross per 24 hour  Intake 1468.5 ml  Output 2150 ml  Net -681.5 ml    Physical Exam: General: Alert, awake, oriented x3, in no acute distress. Face is round and moon-like HEENT: [place]/AT [place], EOMI Neck: Trachea midline,  no masses, no thyromegal,y no JVD, no carotid bruit OROPHARYNX:  Moist, No exudate/ erythema/lesions.  Heart: Regular rate and rhythm, without murmurs, rubs, gallops, PMI non-displaced, no heaves or thrills on palpation.  Lungs: Clear to auscultation, no wheezing or rhonchi noted. No increased vocal fremitus resonant to percussion  Abdomen: Soft,  nontender, nondistended, positive bowel sounds, no masses no hepatosplenomegaly noted..  Neuro: No focal neurological deficits noted cranial nerves II through XII grossly intact. DTRs 2+ bilaterally upper and lower extremities. Strength 5 out of 5 in bilateral upper and lower extremities. Musculoskeletal: No warm swelling or erythema around joints, no spinal tenderness noted. Psychiatric: Patient alert and oriented x3, good insight and cognition, good recent to remote recall. Lymph node survey: No cervical axillary or inguinal lymphadenopathy noted.  Lab Results:  Basic Metabolic Panel:    Component Value Date/Time   NA 133 (L) 08/11/2018 0421   NA 138 06/13/2017 1102   K 4.6 08/11/2018 0421   CL 103 08/11/2018 0421   CO2 18 (L) 08/11/2018 0421   BUN 43 (H) 08/11/2018 0421   BUN 18 06/13/2017 1102   CREATININE 2.42 (H) 08/11/2018 0421   CREATININE 1.45 (H) 05/12/2014 1430   GLUCOSE 182 (H) 08/11/2018 0421   CALCIUM 8.7 (L) 08/11/2018 0421   CBC:    Component Value Date/Time   WBC 6.7 08/11/2018 0421   HGB 5.7 (LL) 08/11/2018 0421   HGB 7.8 (L) 03/28/2018 0942   HCT 17.4 (L) 08/11/2018 0421   HCT 22.8 (L) 03/28/2018 0942   PLT 62 (L) 08/11/2018 0421   PLT 267 03/28/2018 0942   MCV 106.7 (H) 08/11/2018 0421   MCV 115 (H) 03/28/2018 0942   NEUTROABS 6.1 08/11/2018 0421   NEUTROABS 2.2 03/28/2018 4098  LYMPHSABS 0.3 (L) 08/11/2018 0421   LYMPHSABS 3.3 (H) 03/28/2018 0942   MONOABS 0.2 08/11/2018 0421   EOSABS 0.0 08/11/2018 0421   EOSABS 0.1 03/28/2018 0942   BASOSABS 0.0 08/11/2018 0421   BASOSABS 0.0 03/28/2018 0942    Recent Results (from the past 240 hour(s))  SARS Coronavirus 2 (CEPHEID - Performed in [facility] hospital lab), Hosp Order     Status: None   Collection Time: 08/09/18  1:20 PM   Specimen: Nasopharyngeal Swab  Result Value Ref Range Status   SARS Coronavirus 2 NEGATIVE NEGATIVE Final    Comment: (NOTE) If result is NEGATIVE SARS-CoV-2 target  nucleic acids are NOT DETECTED. The SARS-CoV-2 RNA is generally detectable in upper and lower  respiratory specimens during the acute phase of infection. The lowest  concentration of SARS-CoV-2 viral copies this assay can detect is 250  copies / mL. A negative result does not preclude SARS-CoV-2 infection  and should not be used as the sole basis for treatment or other  patient management decisions.  A negative result may occur with  improper specimen collection / handling, submission of specimen other  than nasopharyngeal swab, presence of viral mutation(s) within the  areas targeted by this assay, and inadequate number of viral copies  (<250 copies / mL). A negative result must be combined with clinical  observations, patient history, and epidemiological information. If result is POSITIVE SARS-CoV-2 target nucleic acids are DETECTED. The SARS-CoV-2 RNA is generally detectable in upper and lower  respiratory specimens dur ing the acute phase of infection.  Positive  results are indicative of active infection with SARS-CoV-2.  Clinical  correlation with patient history and other diagnostic information is  necessary to determine patient infection status.  Positive results do  not rule out bacterial infection or co-infection with other viruses. If result is PRESUMPTIVE POSTIVE SARS-CoV-2 nucleic acids MAY BE PRESENT.   A presumptive positive result was obtained on the submitted specimen  and confirmed on repeat testing.  While 2019 novel coronavirus  (SARS-CoV-2) nucleic acids may be present in the submitted sample  additional confirmatory testing may be necessary for epidemiological  and / or clinical management purposes  to differentiate between  SARS-CoV-2 and other Sarbecovirus currently known to infect humans.  If clinically indicated additional testing with an alternate test  methodology [phone]) is advised. The SARS-CoV-2 RNA is generally  detectable in upper and lower  respiratory sp ecimens during the acute  phase of infection. The expected result is Negative. Fact Sheet for Patients:  BoilerBrush.com.cy Fact Sheet for Healthcare Providers: https://pope.com/ This test is not yet approved or cleared by the [place] FDA and has been authorized for detection and/or diagnosis of SARS-CoV-2 by FDA under an Emergency Use Authorization (EUA).  This EUA will remain in effect (meaning this test can be used) for the duration of the COVID-19 declaration under Section 564(b)(1) of the Act, 21 U.S.C. section 360bbb-3(b)(1), unless the authorization is terminated or revoked sooner. Performed at [facility], [address], [place], [place] [address]     Studies/Results: No results found.  Medications: Scheduled Meds: . sodium chloride   Intravenous Once  . cholecalciferol  2,000 Units Oral Daily  . enoxaparin (LOVENOX) injection  40 mg Subcutaneous Q24H  . folic acid  1 mg Oral Daily  . oxyCODONE  20 mg Oral Q4H while awake  . oxyCODONE  15 mg Oral Q12H  . predniSONE  20 mg Oral TID  . senna-docusate  1 tablet Oral BID  . sodium chloride flush  10-40 mL Intracatheter Q12H  . sodium chloride flush  3 mL Intravenous Once  . vitamin B-12  2,000 mcg Oral Daily   Continuous Infusions: . dextrose 5 % and 0.45% NaCl 75 mL/hr at 08/11/18 1039   PRN Meds:.acetaminophen, HYDROmorphone (DILAUDID) injection, polyethylene glycol, sodium chloride flush  Assessment/Plan: Principal Problem:   Sickle cell pain crisis (HCC) Active Problems:   Tachycardia   Cocaine abuse (HCC)   Polysubstance abuse (HCC)   Chronic pain syndrome   CKD (chronic kidney disease), stage III (HCC)   Acute urinary retention   1. Hb Sickle Cell Disease with crisis: Reduce IV fluid to [facility], continue Dilaudid as needed for pain, IV Toradol is contraindicated due to CKD, Monitor vitals very closely, Re-evaluate pain  scale regularly, 2 L of Oxygen by [place].  Patient claims pain is much improved, will continue to monitor 2. Acute kidney injury on top of CKD stage III: Continue steroid prescribed by nephrologist for his CKD. He has a follow-up appointment with nephrologist on July 14.  3. Leukocytosis: Resolved, will continue to monitor 4. Sickle Cell Anemia: Hb is 5.7 today, will transfuse patient with 1 unit of packed red blood cell today.  Labs in a.m. 5. Chronic pain Syndrome: Continue oral pain medications.  Patient is refusing his medications, claiming he is trying to wean himself off of opiates. Patient counseled extensively about pain and nonpharmacologic means of pain management. I had a lengthy discussion with his mom who is in agreement with patient is going for detox. She also asked if patient could be referred to a psychiatrist as outpatient for possible mental health management. Patient and mother reassured that he will get a referral needed once he shows up for his appointment as outpatient. He denies any suicidal ideations or thoughts. 6. Cocaine use disorder: Patient counseled extensively about polysubstance abuse.  Code Status: Full Code Family Communication: N/A Disposition Plan: Not yet ready for discharge  Haidyn Chadderdon  If 7PM-7AM, please contact night-coverage.  08/11/2018, 12:18 PM  LOS: 2 days

## 2018-08-12 LAB — CBC WITH DIFFERENTIAL/PLATELET
Abs Immature Granulocytes: 0.07 10*3/uL (ref 0.00–0.07)
Basophils Absolute: 0 10*3/uL (ref 0.0–0.1)
Basophils Relative: 0 %
Eosinophils Absolute: 0.2 10*3/uL (ref 0.0–0.5)
Eosinophils Relative: 3 %
HCT: 20.3 % — ABNORMAL LOW (ref 39.0–52.0)
Hemoglobin: 6.8 g/dL — CL (ref 13.0–17.0)
Immature Granulocytes: 1 %
Lymphocytes Relative: 7 %
Lymphs Abs: 0.5 10*3/uL — ABNORMAL LOW (ref 0.7–4.0)
MCH: 32.5 pg (ref 26.0–34.0)
MCHC: 33.5 g/dL (ref 30.0–36.0)
MCV: 97.1 fL (ref 80.0–100.0)
Monocytes Absolute: 0.3 10*3/uL (ref 0.1–1.0)
Monocytes Relative: 4 %
Neutro Abs: 5.4 10*3/uL (ref 1.7–7.7)
Neutrophils Relative %: 85 %
Platelets: 45 10*3/uL — ABNORMAL LOW (ref 150–400)
RBC: 2.09 MIL/uL — ABNORMAL LOW (ref 4.22–5.81)
RDW: 23 % — ABNORMAL HIGH (ref 11.5–15.5)
WBC: 6.4 10*3/uL (ref 4.0–10.5)
nRBC: 4.2 % — ABNORMAL HIGH (ref 0.0–0.2)

## 2018-08-12 LAB — BASIC METABOLIC PANEL
Anion gap: 12 (ref 5–15)
BUN: 59 mg/dL — ABNORMAL HIGH (ref 6–20)
CO2: 13 mmol/L — ABNORMAL LOW (ref 22–32)
Calcium: 8.8 mg/dL — ABNORMAL LOW (ref 8.9–10.3)
Chloride: 112 mmol/L — ABNORMAL HIGH (ref 98–111)
Creatinine, Ser: 2.43 mg/dL — ABNORMAL HIGH (ref 0.61–1.24)
GFR calc Af Amer: 38 mL/min — ABNORMAL LOW (ref >60)
GFR calc non Af Amer: 33 mL/min — ABNORMAL LOW (ref >60)
Glucose, Bld: 152 mg/dL — ABNORMAL HIGH (ref 70–99)
Potassium: 5 mmol/L (ref 3.5–5.1)
Sodium: 137 mmol/L (ref 135–145)

## 2018-08-12 LAB — BPAM RBC
Blood Product Expiration Date: 202006302359
ISSUE DATE / TIME: 202006281541
Unit Type and Rh: 5100

## 2018-08-12 LAB — TYPE AND SCREEN
ABO/RH(D): O POS
Antibody Screen: NEGATIVE
Donor AG Type: NEGATIVE
Unit division: 0

## 2018-08-12 NOTE — Discharge Instructions (Signed)
Chronic Kidney Disease, Adult Chronic kidney disease (CKD) happens when the kidneys are damaged over a long period of time. The kidneys are two organs that help with:  Getting rid of waste and extra fluid from the blood.  Making hormones that maintain the amount of fluid in your tissues and blood vessels.  Making sure that the body has the right amount of fluids and chemicals. Most of the time, CKD does not go away, but it can usually be controlled. Steps must be taken to slow down the kidney damage or to stop it from getting worse. If this is not done, the kidneys may stop working. Follow these instructions at home: Medicines  Take over-the-counter and prescription medicines only as told by your doctor. You may need to change the amount of medicines you take.  Do not take any new medicines unless your doctor says it is okay. Many medicines can make your kidney damage worse.  Do not take any vitamin and supplements unless your doctor says it is okay. Many vitamins and supplements can make your kidney damage worse. General instructions  Follow a diet as told by your doctor. You may need to stay away from: ? Alcohol. ? Salty foods. ? Foods that are high in:  Potassium.  Calcium.  Protein.  Do not use any products that contain nicotine or tobacco, such as cigarettes and e-cigarettes. If you need help quitting, ask your doctor.  Keep track of your blood pressure at home. Tell your doctor about any changes.  If you have diabetes, keep track of your blood sugar as told by your doctor.  Try to stay at a healthy weight. If you need help, ask your doctor.  Exercise at least 30 minutes a day, 5 days a week.  Stay up-to-date with your shots (immunizations) as told by your doctor.  Keep all follow-up visits as told by your doctor. This is important. Contact a doctor if:  Your symptoms get worse.  You have new symptoms. Get help right away if:  You have symptoms of end-stage  kidney disease. These may include: ? Headaches. ? Numbness in your hands or feet. ? Easy bruising. ? Having hiccups often. ? Chest pain. ? Shortness of breath. ? Stopping of menstrual periods in women.  You have a fever.  You have very little pee (urine).  You have pain or bleeding when you pee. Summary  Chronic kidney disease (CKD) happens when the kidneys are damaged over a long period of time.  Most of the time, this condition does not go away, but it can usually be controlled. Steps must be taken to slow down the kidney damage or to stop it from getting worse.  Treatment may include a combination of medicines and lifestyle changes. This information is not intended to replace advice given to you by your health care provider. Make sure you discuss any questions you have with your health care provider. Document Released: 04/26/2009 Document Revised: 01/12/2017 Document Reviewed: 03/06/2016 Elsevier Patient Education  2020 [place].  Sickle Cell Anemia, Adult  Sickle cell anemia is a condition where your red blood cells are shaped like sickles. Red blood cells carry oxygen through the body. Sickle-shaped cells do not live as long as normal red blood cells. They also clump together and block blood from flowing through the blood vessels. This prevents the body from getting enough oxygen. Sickle cell anemia causes organ damage and pain. It also increases the risk of infection. Follow these instructions at home:  Medicines  Take over-the-counter and prescription medicines only as told by your doctor.  If you were prescribed an antibiotic medicine, take it as told by your doctor. Do not stop taking the antibiotic even if you start to feel better.  If you develop a fever, do not take medicines to lower the fever right away. Tell your doctor about the fever. Managing pain, stiffness, and swelling  Try these methods to help with pain: ? Use a heating pad. ? Take a warm  bath. ? Distract yourself, such as by watching TV. Eating and drinking  Drink enough fluid to keep your pee (urine) clear or pale yellow. Drink more in hot weather and during exercise.  Limit or avoid alcohol.  Eat a healthy diet. Eat plenty of fruits, vegetables, whole grains, and lean protein.  Take vitamins and supplements as told by your doctor. Traveling  When traveling, keep these with you: ? Your medical information. ? The names of your doctors. ? Your medicines.  If you need to take an airplane, talk to your doctor first. Activity  Rest often.  Avoid exercises that make your heart beat much faster, such as jogging. General instructions  Do not use products that have nicotine or tobacco, such as cigarettes and e-cigarettes. If you need help quitting, ask your doctor.  Consider wearing a medical alert bracelet.  Avoid being in high places (high altitudes), such as mountains.  Avoid very hot or cold temperatures.  Avoid places where the temperature changes a lot.  Keep all follow-up visits as told by your doctor. This is important. Contact a doctor if:  A joint hurts.  Your feet or hands hurt or swell.  You feel tired (fatigued). Get help right away if:  You have symptoms of infection. These include: ? Fever. ? Chills. ? Being very tired. ? Irritability. ? Poor eating. ? Throwing up (vomiting).  You feel dizzy or faint.  You have new stomach pain, especially on the left side.  You have a an erection (priapism) that lasts more than 4 hours.  You have numbness in your arms or legs.  You have a hard time moving your arms or legs.  You have trouble talking.  You have pain that does not go away when you take medicine.  You are short of breath.  You are breathing fast.  You have a long-term cough.  You have pain in your chest.  You have a bad headache.  You have a stiff neck.  Your stomach looks bloated even though you did not eat  much.  Your skin is pale.  You suddenly cannot see well. Summary  Sickle cell anemia is a condition where your red blood cells are shaped like sickles.  Follow your doctor's advice on ways to manage pain, food to eat, activities to do, and steps to take for safe travel.  Get medical help right away if you have any signs of infection, such as a fever. This information is not intended to replace advice given to you by your health care provider. Make sure you discuss any questions you have with your health care provider. Document Released: 11/20/2012 Document Revised: 05/24/2018 Document Reviewed: 03/07/2016 Elsevier Patient Education  2020 [employer].

## 2018-08-12 NOTE — Discharge Summary (Signed)
Physician Discharge Summary  [name] [name]. VHQ:[id number] DOB: 09/05/1980 DOA: 08/09/2018  PCP: [name], MD  Admit date: 08/09/2018  Discharge date: 08/12/2018  Discharge Diagnoses:  Principal Problem:   Sickle cell pain crisis ([facility]) Active Problems:   Tachycardia   Cocaine abuse ([facility])   Polysubstance abuse ([facility])   Chronic pain syndrome   CKD (chronic kidney disease), stage III ([place])   Acute urinary retention   Discharge Condition: Stable  Disposition:  Pt is discharged home in good condition and is to follow up with [name], MD in 1 week to have labs evaluated. [name] A Capetillo Jr. is instructed to increase activity slowly and balance with rest for the next few days, and use prescribed medication to complete treatment of pain  Diet: Regular Wt Readings from Last 3 Encounters:  08/09/18 59.4 kg  07/30/18 59.4 kg  07/24/18 55.2 kg    History of present illness:  [name] [name]  is a 38 y.o. male with a medical history significant for sickle cell disease, chronic kidney disease stage III/nephrotic syndrome, chronic pain syndrome, opiate dependence, and history of polysubstance abuse presented to emergency department for evaluation of increased generalized pain that is consistent with typical sickle cell pain crisis.  Patient was in usual state of health until yesterday when he developed low back and lower extremity pain.  Currently pain intensity is 10/10 characterized as constant and sharp.  Also patient reports that he had the urge to urinate without the ability to empty his bladder.  Patient states that he has been able to empty bladder in ER.  He denies a history of urinary retention, however patient has sickle cell related nephropathy and stage III chronic kidney disease. Patient denies fever, chills, recent travel, sick contacts, or exposure to COVID-19. Patient denies headache, dizziness, paresthesias, dysuria, nausea, vomiting, or  diarrhea.  ER course: Urinalysis shows protein greater than 300, serum creatinine 2.48, alkaline phosphatase 182, and bilirubin 2.0.  Hemoglobin 6.8, WBCs 14.4, and platelets 116.  Patient afebrile.  Tachycardia noted HR 110-130s. Urine drug screen positive for cocaine.  COVID-19 negative. Pain persists despite several doses of Dilaudid SQ and OxyContin 15 mg.  Patient admitted to telemetry for sickle cell pain crisis.   Hospital Course:  Sickle cell disease with pain crisis: Patient was admitted with sickle cell pain crisis, pain persisted despite treatment with opiate medications in the ER.  Patient was treated with IV fluids, subcutaneous Dilaudid, oral pain medications, and other adjunct medications for sickle cell pain management protocol.  Refrain from use of IV medications due to positive urine drug screen.  Urine drug screen was positive for cocaine.  Patient stated that he was weaning himself off of oral pain medications due to the fact that they have been ineffective. Pain intensity decreased to 6/10. Patient has a history of anemia of chronic disease.  Hemoglobin decreased to 5.7 due to acute hemolysis.  Patient was transfused 1 unit of PRBCs.  Hemoglobin returned to baseline at 6.8.  Patient will follow-up at [facility] patient care center on Tuesday, August 20, 2018 with PCP to repeat labs and for medication management. Patient has a history of chronic kidney disease stage III due to sickle cell nephropathy: Patient was continued on prednisone without interruption.  Patient is followed by outpatient nephrology.  Patient is aware of schedule follow-up.  Creatinine at discharge is 2.4, which is consistent with his baseline. Urinary retention: On admission, patient complained of urinary retention.  Patient  was retaining urine and bladder, Foley catheter was placed.  Urinalysis and urine culture negative.  Resolved.  Patient has a history of polysubstance abuse: On admission, urine drug  screen was positive for cocaine. Discussed the dangers of cocaine use at length, patient expressed understanding. Patient declined social work consult and contact information for [facility]. Patient agreed to meet with social worker during follow up appointment at the [facility].   Patient is aware of all upcoming appointments.  He is alert, oriented and ambulating without assistance. Patient afebrile and maintaining oxygen saturation above 90% on room air.  Pain intensity decreased to 6/10. Patient says that he can manage at home.   Patient was discharged home today in a hemodynamically stable condition.   Discharge Exam: Vitals:   08/11/18 1917 08/12/18 0436  BP: (!) 143/97 (!) 132/104  Pulse: (!) 112 (!) 108  Resp:  (!) 22  Temp: (!) 100.6 F (38.1 C) 99 F (37.2 C)  SpO2: 92% 94%   Vitals:   08/11/18 1603 08/11/18 1711 08/11/18 1917 08/12/18 0436  BP: (!) 134/98  (!) 143/97 (!) 132/104  Pulse: (!) 116  (!) 112 (!) 108  Resp:    (!) 22  Temp: 100.3 F (37.9 C) 99 F (37.2 C) (!) 100.6 F (38.1 C) 99 F (37.2 C)  TempSrc: Oral Oral Oral Oral  SpO2: 92%  92% 94%  Weight:      Height:        General appearance : Awake, alert, not in any distress. Speech Clear. Not toxic looking HEENT: Atraumatic and Normocephalic, pupils equally reactive to light and accomodation Neck: Supple, no JVD. No cervical lymphadenopathy.  Chest: Good air entry bilaterally, no added sounds  CVS: S1 S2 regular, no murmurs.  Abdomen: Bowel sounds present, Non tender and not distended with no gaurding, rigidity or rebound. Extremities: B/L Lower Ext shows no edema, both legs are warm to touch Neurology: Awake alert, and oriented X 3, CN II-XII intact, Non focal Skin: No Rash  Discharge Instructions  Discharge Instructions    Discharge patient   Complete by: As directed    Discharge disposition: 01-Home or Self Care   Discharge patient date: 08/12/2018     Allergies as  of 08/12/2018      Reactions   Nsaids Other (See Comments)   Kidney disease   Morphine And Related Other (See Comments)   "real bad headaches"      Medication List    TAKE these medications   cholecalciferol 25 MCG (1000 UT) tablet Commonly known as: VITAMIN D3 Take 2,000 Units by mouth daily.   [employer] VITAMIN B12 1000 MCG tablet Generic drug: cyanocobalamin Take 2,000 mcg by mouth daily.   ergocalciferol 1.25 MG (50000 UT) capsule Commonly known as: Drisdol Take 1 capsule (50,000 Units total) by mouth once a week.   folic acid 1 MG tablet Commonly known as: FOLVITE Take 1 tablet (1 mg total) daily by mouth.   hydroxyurea 500 MG capsule Commonly known as: HYDREA TAKE 3 CAPSULES BY MOUTH DAILY What changed:   how much to take  how to take this  when to take this  additional instructions   nortriptyline 25 MG capsule Commonly known as: PAMELOR Take 1 capsule (25 mg total) by mouth at bedtime as needed for sleep.   oxyCODONE 15 mg 12 hr tablet Commonly known as: OxyCONTIN Take 1 tablet (15 mg total) by mouth every 12 (twelve) hours.   Oxycodone HCl  10 MG Tabs Commonly known as: Roxicodone Take 1 tablet (10 mg total) by mouth every 6 (six) hours as needed for severe pain.   predniSONE 20 MG tablet Commonly known as: DELTASONE Take 20 mg by mouth 3 (three) times daily.       The results of significant diagnostics from this hospitalization (including imaging, microbiology, ancillary and laboratory) are listed below for reference.    Significant Diagnostic Studies: Dg Chest 2 View  Result Date: 07/22/2018 CLINICAL DATA:  Sickle cell crisis.  Pain EXAM: CHEST - 2 VIEW COMPARISON:  July 21, 2018 FINDINGS: There is atelectatic change in the left base. The lungs elsewhere are clear. Heart size and pulmonary vascularity are normal. No adenopathy. There is a vascular necrosis in the right humeral head. Several thoracic and lumbar spine endplate infarcts are again noted.  IMPRESSION: New left base atelectasis. Lungs elsewhere clear. Stable cardiac silhouette. No adenopathy. Bony changes indicative of sickle cell disease as noted. Electronically Signed   By: Lowella Grip III M.D.   On: 07/22/2018 14:18   Dg Chest 2 View  Result Date: 07/21/2018 CLINICAL DATA:  Chest pain.  Sickle cell disease. EXAM: CHEST - 2 VIEW COMPARISON:  Jul 03, 2018 FINDINGS: No edema or consolidation. Heart size and pulmonary vascularity are normal. No adenopathy. No pneumothorax. 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 There are endplate infarcts in several vertebral bodies. IMPRESSION: Lungs clear. No adenopathy. Bony changes indicative of known sickle cell disease. Electronically Signed   By: Lowella Grip III M.D.   On: 07/21/2018 16:27    Microbiology: Recent Results (from the past 240 hour(s))  SARS Coronavirus 2 (CEPHEID - Performed in [place] hospital lab), Hosp Order     Status: None   Collection Time: 08/09/18  1:20 PM   Specimen: Nasopharyngeal Swab  Result Value Ref Range Status   SARS Coronavirus 2 NEGATIVE NEGATIVE Final    Comment: (NOTE) If result is NEGATIVE SARS-CoV-2 target nucleic acids are NOT DETECTED. The SARS-CoV-2 RNA is generally detectable in upper and lower  respiratory specimens during the acute phase of infection. The lowest  concentration of SARS-CoV-2 viral copies this assay can detect is 250  copies / mL. A negative result does not preclude SARS-CoV-2 infection  and should not be used as the sole basis for treatment or other  patient management decisions.  A negative result may occur with  improper specimen collection / handling, submission of specimen other  than nasopharyngeal swab, presence of viral mutation(s) within the  areas targeted by this assay, and inadequate number of viral copies  (<250 copies / mL). A negative result must be combined with clinical  observations, patient history, and epidemiological  information. If result is POSITIVE SARS-CoV-2 target nucleic acids are DETECTED. The SARS-CoV-2 RNA is generally detectable in upper and lower  respiratory specimens dur ing the acute phase of infection.  Positive  results are indicative of active infection with SARS-CoV-2.  Clinical  correlation with patient history and other diagnostic information is  necessary to determine patient infection status.  Positive results do  not rule out bacterial infection or co-infection with other viruses. If result is PRESUMPTIVE POSTIVE SARS-CoV-2 nucleic acids MAY BE PRESENT.   A presumptive positive result was obtained on the submitted specimen  and confirmed on repeat testing.  While 2019 novel coronavirus  (SARS-CoV-2) nucleic acids may be present in the submitted sample  additional confirmatory testing may be necessary for epidemiological  and /  or clinical management purposes  to differentiate between  SARS-CoV-2 and other Sarbecovirus currently known to infect humans.  If clinically indicated additional testing with an alternate test  methodology [phone]) is advised. The SARS-CoV-2 RNA is generally  detectable in upper and lower respiratory sp ecimens during the acute  phase of infection. The expected result is Negative. Fact Sheet for Patients:  StrictlyIdeas.no Fact Sheet for Healthcare Providers: BankingDealers.co.za This test is not yet approved or cleared by the [place] FDA and has been authorized for detection and/or diagnosis of SARS-CoV-2 by FDA under an Emergency Use Authorization (EUA).  This EUA will remain in effect (meaning this test can be used) for the duration of the COVID-19 declaration under Section 564(b)(1) of the Act, 21 U.S.C. section 360bbb-3(b)(1), unless the authorization is terminated or revoked sooner. Performed at [facility], [place] [address], [place], [place] [address]       Labs: Basic Metabolic Panel: Recent Labs  Lab 08/09/18 0630 08/10/18 0311 08/11/18 0421 08/12/18 0320  NA 140 136 133* 137  K 4.3 4.5 4.6 5.0  CL 107 105 103 112*  CO2 24 20* 18* 13*  GLUCOSE 136* 111* 182* 152*  BUN 56* 37* 43* 59*  CREATININE 2.48* 2.18* 2.42* 2.43*  CALCIUM 8.4* 8.4* 8.7* 8.8*   Liver Function Tests: Recent Labs  Lab 08/09/18 0630 08/10/18 0311  AST 40 45*  ALT 85* 81*  ALKPHOS 182* 174*  BILITOT 2.0* 2.3*  PROT 5.2* 5.2*  ALBUMIN 2.7* 2.6*   No results for input(s): LIPASE, AMYLASE in the last 168 hours. No results for input(s): AMMONIA in the last 168 hours. CBC: Recent Labs  Lab 08/09/18 0630 08/10/18 0311 08/11/18 0421 08/12/18 0320  WBC 14.4* 10.4 6.7 6.4  NEUTROABS 11.7* 8.5* 6.1 5.4  HGB 6.8* 6.7* 5.7* 6.8*  HCT 19.9* 20.1* 17.4* 20.3*  MCV 112.4* 114.2* 106.7* 97.1  PLT 116* 106* 62* 45*   Cardiac Enzymes: No results for input(s): CKTOTAL, CKMB, CKMBINDEX, TROPONINI in the last 168 hours. BNP: Invalid input(s): POCBNP CBG: No results for input(s): GLUCAP in the last 168 hours.  Time coordinating discharge: 50 minutes  Signed:  [name]  APRN, MSN, FNP-C [facility] [address] [place], [place] [address] [phone]  [facility] 08/12/2018, 11:39 AM

## 2018-08-12 NOTE — Progress Notes (Signed)
Pt refused pain medicine and stool softener per eMAR this AM. Pt did drink a cup of water with other medications and asked for more water. Pt has 2 cups of ice water at the bedside and has no other concerns at this time. Pt shakes his head no when asked about pain.

## 2018-08-18 ENCOUNTER — Other Ambulatory Visit: Payer: Self-pay

## 2018-08-18 ENCOUNTER — Encounter (HOSPITAL_COMMUNITY): Payer: Self-pay | Admitting: Emergency Medicine

## 2018-08-18 ENCOUNTER — Inpatient Hospital Stay (HOSPITAL_COMMUNITY)
Admission: EM | Admit: 2018-08-18 | Discharge: 2018-08-21 | DRG: 812 | Disposition: A | Discharge status: Home / Self Care | Payer: Medicare Other | Attending: Internal Medicine | Admitting: Internal Medicine

## 2018-08-18 DIAGNOSIS — Z03818 Encounter for observation for suspected exposure to other biological agents ruled out: Secondary | ICD-10-CM | POA: Diagnosis not present

## 2018-08-18 DIAGNOSIS — R079 Chest pain, unspecified: Secondary | ICD-10-CM | POA: Diagnosis present

## 2018-08-18 DIAGNOSIS — Z79899 Other long term (current) drug therapy: Secondary | ICD-10-CM

## 2018-08-18 DIAGNOSIS — Z7952 Long term (current) use of systemic steroids: Secondary | ICD-10-CM

## 2018-08-18 DIAGNOSIS — M79672 Pain in left foot: Secondary | ICD-10-CM

## 2018-08-18 DIAGNOSIS — Z9049 Acquired absence of other specified parts of digestive tract: Secondary | ICD-10-CM

## 2018-08-18 DIAGNOSIS — Z1159 Encounter for screening for other viral diseases: Secondary | ICD-10-CM

## 2018-08-18 DIAGNOSIS — D5700 Hb-SS disease with crisis, unspecified: Secondary | ICD-10-CM | POA: Diagnosis not present

## 2018-08-18 DIAGNOSIS — N289 Disorder of kidney and ureter, unspecified: Secondary | ICD-10-CM | POA: Diagnosis not present

## 2018-08-18 DIAGNOSIS — F1123 Opioid dependence with withdrawal: Secondary | ICD-10-CM | POA: Diagnosis present

## 2018-08-18 DIAGNOSIS — K219 Gastro-esophageal reflux disease without esophagitis: Secondary | ICD-10-CM | POA: Diagnosis present

## 2018-08-18 DIAGNOSIS — D571 Sickle-cell disease without crisis: Secondary | ICD-10-CM

## 2018-08-18 DIAGNOSIS — N184 Chronic kidney disease, stage 4 (severe): Secondary | ICD-10-CM | POA: Diagnosis present

## 2018-08-18 DIAGNOSIS — Z885 Allergy status to narcotic agent status: Secondary | ICD-10-CM

## 2018-08-18 DIAGNOSIS — F1410 Cocaine abuse, uncomplicated: Secondary | ICD-10-CM | POA: Diagnosis present

## 2018-08-18 DIAGNOSIS — N183 Chronic kidney disease, stage 3 unspecified: Secondary | ICD-10-CM

## 2018-08-18 DIAGNOSIS — N08 Glomerular disorders in diseases classified elsewhere: Secondary | ICD-10-CM | POA: Diagnosis present

## 2018-08-18 DIAGNOSIS — E871 Hypo-osmolality and hyponatremia: Secondary | ICD-10-CM | POA: Diagnosis present

## 2018-08-18 DIAGNOSIS — G894 Chronic pain syndrome: Secondary | ICD-10-CM | POA: Diagnosis present

## 2018-08-18 DIAGNOSIS — N049 Nephrotic syndrome with unspecified morphologic changes: Secondary | ICD-10-CM

## 2018-08-18 DIAGNOSIS — Z886 Allergy status to analgesic agent status: Secondary | ICD-10-CM

## 2018-08-18 DIAGNOSIS — D638 Anemia in other chronic diseases classified elsewhere: Secondary | ICD-10-CM | POA: Diagnosis present

## 2018-08-18 DIAGNOSIS — F1910 Other psychoactive substance abuse, uncomplicated: Secondary | ICD-10-CM | POA: Diagnosis present

## 2018-08-18 DIAGNOSIS — Z803 Family history of malignant neoplasm of breast: Secondary | ICD-10-CM

## 2018-08-18 DIAGNOSIS — Z87441 Personal history of nephrotic syndrome: Secondary | ICD-10-CM

## 2018-08-18 DIAGNOSIS — F17210 Nicotine dependence, cigarettes, uncomplicated: Secondary | ICD-10-CM | POA: Diagnosis present

## 2018-08-18 DIAGNOSIS — D649 Anemia, unspecified: Secondary | ICD-10-CM

## 2018-08-18 MED ORDER — SODIUM CHLORIDE 0.9% FLUSH
3.0000 mL | Freq: Once | INTRAVENOUS | Status: AC
  Administered 2018-08-19: 3 mL via INTRAVENOUS

## 2018-08-18 NOTE — ED Triage Notes (Addendum)
Patient c/o swelling and pain to left foot r/t SCC x2 days. Patient also c/o chest pain that began one hour PTA.

## 2018-08-19 ENCOUNTER — Other Ambulatory Visit: Payer: Self-pay

## 2018-08-19 DIAGNOSIS — D5700 Hb-SS disease with crisis, unspecified: Secondary | ICD-10-CM | POA: Diagnosis not present

## 2018-08-19 DIAGNOSIS — N049 Nephrotic syndrome with unspecified morphologic changes: Secondary | ICD-10-CM | POA: Diagnosis not present

## 2018-08-19 DIAGNOSIS — D649 Anemia, unspecified: Secondary | ICD-10-CM

## 2018-08-19 DIAGNOSIS — N183 Chronic kidney disease, stage 3 (moderate): Secondary | ICD-10-CM

## 2018-08-19 LAB — CBC WITH DIFFERENTIAL/PLATELET
Abs Immature Granulocytes: 0.05 10*3/uL (ref 0.00–0.07)
Basophils Absolute: 0 10*3/uL (ref 0.0–0.1)
Basophils Relative: 0 %
Eosinophils Absolute: 0 10*3/uL (ref 0.0–0.5)
Eosinophils Relative: 0 %
HCT: 16.6 % — ABNORMAL LOW (ref 39.0–52.0)
Hemoglobin: 5.4 g/dL — CL (ref 13.0–17.0)
Immature Granulocytes: 1 %
Lymphocytes Relative: 18 %
Lymphs Abs: 1.5 10*3/uL (ref 0.7–4.0)
MCH: 33.8 pg (ref 26.0–34.0)
MCHC: 32.5 g/dL (ref 30.0–36.0)
MCV: 103.8 fL — ABNORMAL HIGH (ref 80.0–100.0)
Monocytes Absolute: 0.3 10*3/uL (ref 0.1–1.0)
Monocytes Relative: 4 %
Neutro Abs: 6.5 10*3/uL (ref 1.7–7.7)
Neutrophils Relative %: 77 %
Platelets: 151 10*3/uL (ref 150–400)
RBC: 1.6 MIL/uL — ABNORMAL LOW (ref 4.22–5.81)
RDW: 25.7 % — ABNORMAL HIGH (ref 11.5–15.5)
WBC: 8.4 10*3/uL (ref 4.0–10.5)
nRBC: 7.4 % — ABNORMAL HIGH (ref 0.0–0.2)

## 2018-08-19 LAB — CBC
HCT: 21.9 % — ABNORMAL LOW (ref 39.0–52.0)
Hemoglobin: 7.6 g/dL — ABNORMAL LOW (ref 13.0–17.0)
MCH: 33.3 pg (ref 26.0–34.0)
MCHC: 34.7 g/dL (ref 30.0–36.0)
MCV: 96.1 fL (ref 80.0–100.0)
Platelets: UNDETERMINED 10*3/uL (ref 150–400)
RBC: 2.28 MIL/uL — ABNORMAL LOW (ref 4.22–5.81)
RDW: 20.9 % — ABNORMAL HIGH (ref 11.5–15.5)
WBC: 10.3 10*3/uL (ref 4.0–10.5)
nRBC: 7.3 % — ABNORMAL HIGH (ref 0.0–0.2)

## 2018-08-19 LAB — COMPREHENSIVE METABOLIC PANEL
ALT: 63 U/L — ABNORMAL HIGH (ref 0–44)
ALT: 63 U/L — ABNORMAL HIGH (ref 0–44)
AST: 33 U/L (ref 15–41)
AST: 34 U/L (ref 15–41)
Albumin: 2.6 g/dL — ABNORMAL LOW (ref 3.5–5.0)
Albumin: 2.7 g/dL — ABNORMAL LOW (ref 3.5–5.0)
Alkaline Phosphatase: 207 U/L — ABNORMAL HIGH (ref 38–126)
Alkaline Phosphatase: 215 U/L — ABNORMAL HIGH (ref 38–126)
Anion gap: 10 (ref 5–15)
Anion gap: 9 (ref 5–15)
BUN: 43 mg/dL — ABNORMAL HIGH (ref 6–20)
BUN: 41 mg/dL — ABNORMAL HIGH (ref 6–20)
CO2: 22 mmol/L (ref 22–32)
CO2: 19 mmol/L — ABNORMAL LOW (ref 22–32)
Calcium: 8.2 mg/dL — ABNORMAL LOW (ref 8.9–10.3)
Calcium: 8.6 mg/dL — ABNORMAL LOW (ref 8.9–10.3)
Chloride: 110 mmol/L (ref 98–111)
Chloride: 115 mmol/L — ABNORMAL HIGH (ref 98–111)
Creatinine, Ser: 2.39 mg/dL — ABNORMAL HIGH (ref 0.61–1.24)
Creatinine, Ser: 2.23 mg/dL — ABNORMAL HIGH (ref 0.61–1.24)
GFR calc Af Amer: 39 mL/min — ABNORMAL LOW (ref >60)
GFR calc Af Amer: 43 mL/min — ABNORMAL LOW (ref >60)
GFR calc non Af Amer: 34 mL/min — ABNORMAL LOW (ref >60)
GFR calc non Af Amer: 37 mL/min — ABNORMAL LOW (ref >60)
Glucose, Bld: 130 mg/dL — ABNORMAL HIGH (ref 70–99)
Glucose, Bld: 121 mg/dL — ABNORMAL HIGH (ref 70–99)
Potassium: 3.8 mmol/L (ref 3.5–5.1)
Potassium: 4.9 mmol/L (ref 3.5–5.1)
Sodium: 142 mmol/L (ref 135–145)
Sodium: 143 mmol/L (ref 135–145)
Total Bilirubin: 0.8 mg/dL (ref 0.3–1.2)
Total Bilirubin: 1.2 mg/dL (ref 0.3–1.2)
Total Protein: 5.3 g/dL — ABNORMAL LOW (ref 6.5–8.1)
Total Protein: 5.4 g/dL — ABNORMAL LOW (ref 6.5–8.1)

## 2018-08-19 LAB — RETICULOCYTES
Immature Retic Fract: 37.5 % — ABNORMAL HIGH (ref 2.3–15.9)
Immature Retic Fract: 37.1 % — ABNORMAL HIGH (ref 2.3–15.9)
RBC.: 1.6 MIL/uL — ABNORMAL LOW (ref 4.22–5.81)
RBC.: 2.28 MIL/uL — ABNORMAL LOW (ref 4.22–5.81)
Retic Count, Absolute: 91.4 10*3/uL (ref 19.0–186.0)
Retic Count, Absolute: 94.6 10*3/uL (ref 19.0–186.0)
Retic Ct Pct: 5.7 % — ABNORMAL HIGH (ref 0.4–3.1)
Retic Ct Pct: 4.2 % — ABNORMAL HIGH (ref 0.4–3.1)

## 2018-08-19 LAB — PREPARE RBC (CROSSMATCH)

## 2018-08-19 LAB — LACTATE DEHYDROGENASE
LDH: 392 U/L — ABNORMAL HIGH (ref 98–192)
LDH: 479 U/L — ABNORMAL HIGH (ref 98–192)

## 2018-08-19 LAB — MAGNESIUM: Magnesium: 2.3 mg/dL (ref 1.7–2.4)

## 2018-08-19 MED ORDER — SODIUM CHLORIDE 0.9 % IV SOLN
10.0000 mL/h | Freq: Once | INTRAVENOUS | Status: AC
  Administered 2018-08-19: 10 mL/h via INTRAVENOUS

## 2018-08-19 MED ORDER — HYDROMORPHONE HCL 2 MG/ML IJ SOLN
2.0000 mg | INTRAMUSCULAR | Status: AC
  Administered 2018-08-19: 2 mg via INTRAVENOUS
  Filled 2018-08-19: qty 1

## 2018-08-19 MED ORDER — HYDROXYUREA 500 MG PO CAPS
1500.0000 mg | ORAL_CAPSULE | Freq: Every day | ORAL | Status: DC
  Administered 2018-08-19 – 2018-08-21 (×3): 1500 mg via ORAL
  Filled 2018-08-19 (×3): qty 3

## 2018-08-19 MED ORDER — OXYCODONE HCL ER 15 MG PO T12A
15.0000 mg | EXTENDED_RELEASE_TABLET | Freq: Two times a day (BID) | ORAL | Status: DC
  Administered 2018-08-19 – 2018-08-21 (×5): 15 mg via ORAL
  Filled 2018-08-19 (×5): qty 1

## 2018-08-19 MED ORDER — PREDNISONE 20 MG PO TABS
20.0000 mg | ORAL_TABLET | Freq: Three times a day (TID) | ORAL | Status: DC
  Administered 2018-08-19 – 2018-08-21 (×7): 20 mg via ORAL
  Filled 2018-08-19 (×7): qty 1

## 2018-08-19 MED ORDER — HYDROMORPHONE 1 MG/ML IV SOLN
INTRAVENOUS | Status: DC
  Administered 2018-08-19: 30 mg via INTRAVENOUS
  Administered 2018-08-20: 4 mg via INTRAVENOUS
  Administered 2018-08-20: 2.5 mg via INTRAVENOUS
  Administered 2018-08-20: 2 mg via INTRAVENOUS
  Administered 2018-08-20: 30 mg via INTRAVENOUS
  Filled 2018-08-19 (×2): qty 30

## 2018-08-19 MED ORDER — POLYETHYLENE GLYCOL 3350 17 G PO PACK: 17.0000 g | PACK | Freq: Every day | ORAL | Status: DC | PRN

## 2018-08-19 MED ORDER — HYDROMORPHONE HCL 1 MG/ML IJ SOLN
2.0000 mg | INTRAMUSCULAR | Status: DC | PRN
  Administered 2018-08-19: 2 mg via INTRAVENOUS
  Filled 2018-08-19: qty 2

## 2018-08-19 MED ORDER — HYDROMORPHONE HCL 2 MG/ML IJ SOLN: 2.0000 mg | INTRAMUSCULAR | Status: AC

## 2018-08-19 MED ORDER — FOLIC ACID 1 MG PO TABS
1.0000 mg | ORAL_TABLET | Freq: Every day | ORAL | Status: DC
  Administered 2018-08-19 – 2018-08-21 (×3): 1 mg via ORAL
  Filled 2018-08-19 (×3): qty 1

## 2018-08-19 MED ORDER — SODIUM CHLORIDE 0.9% FLUSH: 9.0000 mL | INTRAVENOUS | Status: DC | PRN

## 2018-08-19 MED ORDER — HYDROMORPHONE HCL 2 MG/ML IJ SOLN: 2.0000 mg | INTRAMUSCULAR | Status: DC

## 2018-08-19 MED ORDER — VITAMIN B-12 1000 MCG PO TABS
2000.0000 ug | ORAL_TABLET | Freq: Every day | ORAL | Status: DC
  Administered 2018-08-19 – 2018-08-21 (×3): 2000 ug via ORAL
  Filled 2018-08-19 (×3): qty 2

## 2018-08-19 MED ORDER — HYDROXYUREA 500 MG PO CAPS: 1500.0000 mg | ORAL_CAPSULE | Freq: Every day | ORAL | Status: DC

## 2018-08-19 MED ORDER — DEXTROSE-NACL 5-0.45 % IV SOLN
INTRAVENOUS | Status: DC
  Administered 2018-08-19 – 2018-08-20 (×2): via INTRAVENOUS

## 2018-08-19 MED ORDER — OXYCODONE HCL 5 MG PO TABS
10.0000 mg | ORAL_TABLET | Freq: Four times a day (QID) | ORAL | Status: DC | PRN
  Administered 2018-08-20 – 2018-08-21 (×4): 10 mg via ORAL
  Filled 2018-08-19 (×4): qty 2

## 2018-08-19 MED ORDER — DIPHENHYDRAMINE HCL 50 MG/ML IJ SOLN
25.0000 mg | Freq: Four times a day (QID) | INTRAMUSCULAR | Status: DC | PRN
  Administered 2018-08-19: 25 mg via INTRAVENOUS
  Filled 2018-08-19: qty 1

## 2018-08-19 MED ORDER — HEPARIN SODIUM (PORCINE) 5000 UNIT/ML IJ SOLN
5000.0000 [IU] | Freq: Three times a day (TID) | INTRAMUSCULAR | Status: DC
  Administered 2018-08-19 – 2018-08-20 (×2): 5000 [IU] via SUBCUTANEOUS
  Filled 2018-08-19 (×4): qty 1

## 2018-08-19 MED ORDER — HYDROMORPHONE HCL 2 MG/ML IJ SOLN
2.0000 mg | INTRAMUSCULAR | Status: DC | PRN
  Administered 2018-08-19: 2 mg via INTRAVENOUS
  Filled 2018-08-19: qty 1

## 2018-08-19 MED ORDER — VITAMIN D 25 MCG (1000 UNIT) PO TABS
2000.0000 [IU] | ORAL_TABLET | Freq: Every day | ORAL | Status: DC
  Administered 2018-08-19 – 2018-08-21 (×3): 2000 [IU] via ORAL
  Filled 2018-08-19 (×3): qty 2

## 2018-08-19 MED ORDER — SENNOSIDES-DOCUSATE SODIUM 8.6-50 MG PO TABS
1.0000 | ORAL_TABLET | Freq: Two times a day (BID) | ORAL | Status: DC
  Administered 2018-08-19: 1 via ORAL
  Filled 2018-08-19 (×3): qty 1

## 2018-08-19 MED ORDER — NORTRIPTYLINE HCL 25 MG PO CAPS
25.0000 mg | ORAL_CAPSULE | Freq: Every evening | ORAL | Status: DC | PRN
  Filled 2018-08-19: qty 1

## 2018-08-19 MED ORDER — NALOXONE HCL 0.4 MG/ML IJ SOLN: 0.4000 mg | INTRAMUSCULAR | Status: DC | PRN

## 2018-08-19 NOTE — Progress Notes (Signed)
Subjective: [name] [name], a 38 year old male with a medical history significant for sickle cell disease, chronic kidney disease with sickle cell nephropathy, chronic pain syndrome, opiate dependence, and history of polysubstance abuse was admitted overnight for sickle cell pain crisis.  Mr. Brechtel says that he was awakened with pain 1 day prior to admission.  He has not identified any provocative factors concerning current crisis.  Patient was recently discharged on 08/12/2018 for sickle cell crisis.  Patient states that he was in usual state of health until 1 day ago.  During previous admission, patient's urine drug screen was positive for cocaine.  Current urine drug screen pending.  Patient states that he has not done illicit drugs since prior to previous admission.  Patient continues to complain of increased pain primarily to bilateral feet.  Right greater than left.  Pain intensity 8/10 characterized as constant and throbbing.  Patient states the pain is worsened with weightbearing.  Patient denies headache, dizziness, paresthesias, dysuria, nausea, vomiting, or diarrhea.  Patient is afebrile and maintaining oxygen saturation above 90% on RA.   Objective:  Vital signs in last 24 hours:  Vitals:   08/19/18 0631 08/19/18 0934 08/19/18 1137 08/19/18 1329  BP: (!) 150/101 (!) 129/101 133/88   Pulse: (!) 50 66 76   Resp: 20 18 17 15   Temp: 97.7 F (36.5 C) 98.1 F (36.7 C) (!) 97.5 F (36.4 C)   TempSrc: Oral Oral Oral   SpO2: 100% 100% 100% 100%  Weight:      Height:        Intake/Output from previous day:   Intake/Output Summary (Last 24 hours) at 08/19/2018 1514 Last data filed at 08/19/2018 1500 Gross per 24 hour  Intake 1747.37 ml  Output 1000 ml  Net 747.37 ml    Physical Exam: General: Alert, awake, oriented x3, in no acute distress.  HEENT: [place]/AT [place], EOMI Neck: Trachea midline,  no masses, no thyromegal,y no JVD, no carotid bruit OROPHARYNX:  Moist, No exudate/  erythema/lesions.  Heart: Regular rate and rhythm, without murmurs, rubs, gallops, PMI non-displaced, no heaves or thrills on palpation.  Lungs: Clear to auscultation, no wheezing or rhonchi noted. No increased vocal fremitus resonant to percussion  Abdomen: Soft, nontender, nondistended, positive bowel sounds, no masses no hepatosplenomegaly noted..  Neuro: No focal neurological deficits noted cranial nerves II through XII grossly intact. DTRs 2+ bilaterally upper and lower extremities. Strength 5 out of 5 in bilateral upper and lower extremities. Musculoskeletal: No warm swelling or erythema around joints, no spinal tenderness noted. Psychiatric: Patient alert and oriented x3, good insight and cognition, good recent to remote recall. Lymph node survey: No cervical axillary or inguinal lymphadenopathy noted.  Lab Results:  Basic Metabolic Panel:    Component Value Date/Time   NA 143 08/19/2018 0643   NA 138 06/13/2017 1102   K 4.9 08/19/2018 0643   CL 115 (H) 08/19/2018 0643   CO2 19 (L) 08/19/2018 0643   BUN 41 (H) 08/19/2018 0643   BUN 18 06/13/2017 1102   CREATININE 2.23 (H) 08/19/2018 0643   CREATININE 1.45 (H) 05/12/2014 1430   GLUCOSE 121 (H) 08/19/2018 0643   CALCIUM 8.6 (L) 08/19/2018 0643   CBC:    Component Value Date/Time   WBC 10.3 08/19/2018 0904   HGB 7.6 (L) 08/19/2018 0904   HGB 7.8 (L) 03/28/2018 0942   HCT 21.9 (L) 08/19/2018 0904   HCT 22.8 (L) 03/28/2018 0942   PLT PLATELET CLUMPS NOTED ON SMEAR, UNABLE TO  ESTIMATE 08/19/2018 0904   PLT 267 03/28/2018 0942   MCV 96.1 08/19/2018 0904   MCV 115 (H) 03/28/2018 0942   NEUTROABS 6.5 08/19/2018 0048   NEUTROABS 2.2 03/28/2018 0942   LYMPHSABS 1.5 08/19/2018 0048   LYMPHSABS 3.3 (H) 03/28/2018 0942   MONOABS 0.3 08/19/2018 0048   EOSABS 0.0 08/19/2018 0048   EOSABS 0.1 03/28/2018 0942   BASOSABS 0.0 08/19/2018 0048   BASOSABS 0.0 03/28/2018 0942    No results found for this or any previous visit (from  the past 240 hour(s)).  Studies/Results: No results found.  Medications: Scheduled Meds: . cholecalciferol  2,000 Units Oral Daily  . folic acid  1 mg Oral Daily  . heparin  5,000 Units Subcutaneous Q8H  . HYDROmorphone   Intravenous Q4H  . oxyCODONE  15 mg Oral Q12H  . predniSONE  20 mg Oral TID  . senna-docusate  1 tablet Oral BID  . cyanocobalamin  2,000 mcg Oral Daily   Continuous Infusions: . dextrose 5 % and 0.45% NaCl 75 mL/hr at 08/19/18 1500   PRN Meds:.naloxone **AND** sodium chloride flush, nortriptyline, oxyCODONE, polyethylene glycol   Assessment/Plan: Principal Problem:   Sickle cell pain crisis (HCC) Active Problems:   Chronic kidney disease   Polysubstance abuse (HCC)   Chest pain   Nephrotic syndrome   Sickle cell pain crisis: Continue IV fluids, decreased to 50 mL/h Discontinue IV Dilaudid every 3 hours as needed for breakthrough pain.  Initiate high concentration IV Dilaudid via PCA with settings of 0.5 mg, 10-minute lockout, and 2 mg/h. Continue oxycodone 10 mg every 6 hours as needed for moderate to severe breakthrough pain Maintain oxygen saturation above 90%, 2 L supplemental oxygen as needed  Chronic pain syndrome: OxyContin 15 mg every 12 hours Oxycodone 10 mg every 6 hours as needed to severe breakthrough pain  Anemia of chronic disease: On admission, hemoglobin 5.4.  Patient received 2 units of PRBCs.  Hemoglobin has returned to baseline.  Continue to follow CBC.   CKD stage III/nephrotic syndrome: Creatinine stable.  Consistent with baseline.  Patient is followed by outpatient nephrology. Continue prednisone.  Continue to avoid nephrotoxins Follow renal function  History of polysubstance abuse: Previous UDS positive for cocaine.  Current UDS pending, will review as results become available.  Code Status: Full Code Family Communication: N/A Disposition Plan: Not yet ready for discharge  [place], MSN,  FNP-C Patient [place] [facility] [address] [place], [place] [address] [phone]  If 5PM-7AM, please contact night-coverage.  08/19/2018, 3:14 PM  LOS: 0 days
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
## 2018-08-19 NOTE — H&P (Signed)
History and Physical    [name]. HYI:[id number] DOB: [birth year] DOA: 08/18/2018  PCP: [name], MD Patient coming from: Home  I have personally briefly reviewed patient's old medical records in [place]  Chief Complaint: Chest pain, left foot/leg pain  HPI: [name] [name]. is a 38 y.o. male with medical history significant of sickle cell disease, CKD stage III/nephrotic syndrome, chronic pain syndrome, opioid dependence, history of polysubstance abuse with cocaine who presents with progressive left foot/leg pain since yesterday.  Patient was recently discharged from [facility] under the sickle cell service about 1 week ago.  States he has been out of pain medicine at home and only using Tylenol.  Yesterday he started developing left foot pain which is progressed up his left leg and now complains of bilateral leg pain.  He also reported episode of chest discomfort this morning which has since have subsided.  Patient states this is his usual disease course in regards to his sickle cell disease.  He is supposed to follow-up with the sickle cell center tomorrow.  Patient has no other complaints at this time.    He specifically denies headache, no fever/chills/night sweats, no nausea/vomiting/diarrhea, no shortness of breath, no abdominal pain, no paresthesias.  ED Course: Temperature 99.2, HR 75, RR 21, BP 126/98, SPO2 100% on room air.  WBC count 8.4, hemoglobin 5.4, platelet 151.  Retake count 5.7.  Sodium 142, potassium 3.8, chloride 110, CO2 22, BUN 43, creatinine 2.39, glucose 130.  AST 33, ALT 363.    Review of Systems: As per HPI otherwise 10 point review of systems negative.   Past Medical History:  Diagnosis Date  . Arachnoid Cyst of brain bilaterally    "2 really small ones in the back of my head; inside; saw them w/MRI" (09/25/2012)  . Bacterial pneumonia ~ 2012   "caught it here in the hospital" (09/25/2012)  . Chronic kidney disease     "from my sickle cell" (09/25/2012)  . CKD (chronic kidney disease), stage II   . GERD (gastroesophageal reflux disease)    "after I eat alot of spicey foods" (09/25/2012)  . Gynecomastia, male 07/10/2012  . History of blood transfusion    "always related to sickle cell crisis" (09/25/2012)  . 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   Noted in noted from Hematology notes at [facility]  . Migraines    "take RX qd to prevent them" (09/25/2012)  . Opioid dependence with withdrawal ([place]) 08/30/2012  . Sickle cell anemia (HCC)   . Sickle cell crisis ([place]) 09/25/2012  . Sickle cell nephropathy ([place]) 07/10/2012  . Sinus tachycardia   . Tachycardia with heart rate 121-140 beats per minute with ambulation 08/04/2016    Past Surgical History:  Procedure Laterality Date  . CHOLECYSTECTOMY  ~ 2012  . COLONOSCOPY N/A 04/23/2017   Procedure: COLONOSCOPY;  Surgeon: Irene Shipper, MD;  Location: [facility];  Service: Endoscopy;  Laterality: N/A;  . IR FLUORO GUIDE CV LINE RIGHT  12/17/2016  . IR REMOVAL TUN CV CATH W/O FL  12/21/2016  . IR US GUIDE VASC ACCESS RIGHT  12/17/2016  . spleenectomy       reports that he has been smoking cigarettes. He has been smoking about 0.10 packs per day. He has never used smokeless tobacco. He reports current alcohol use. He reports previous drug use. Drug: Marijuana.  Allergies  Allergen Reactions  . Nsaids Other (See Comments)    Kidney disease  .  Morphine And Related Other (See Comments)    "real bad headaches"    Family History  Problem Relation Age of Onset  . Breast cancer Mother     Prior to Admission medications   Medication Sig Start Date End Date Taking? Authorizing Provider  cholecalciferol (VITAMIN D3) 25 MCG (1000 UT) tablet Take 2,000 Units by mouth daily.   Yes [provider]  [facility] VITAMIN B12 1000 MCG tablet Take 2,000 mcg by mouth daily. 06/21/18  Yes [provider]  folic acid (FOLVITE) 1 MG tablet Take 1 tablet  (1 mg total) daily by mouth. 12/22/16  Yes Leana Gamer, MD  hydroxyurea (HYDREA) 500 MG capsule TAKE 3 CAPSULES BY MOUTH DAILY Patient taking differently: Take 1,500 mg by mouth daily.  06/19/18  Yes Lanae Boast, FNP  nortriptyline (PAMELOR) 25 MG capsule Take 1 capsule (25 mg total) by mouth at bedtime as needed for sleep. 06/10/18  Yes Donnamae Jude, MD  oxyCODONE (OXYCONTIN) 15 mg 12 hr tablet Take 1 tablet (15 mg total) by mouth every 12 (twelve) hours. 07/30/18  Yes [name], FNP  Oxycodone HCl 10 MG TABS Take 1 tablet (10 mg total) by mouth every 6 (six) hours as needed for severe pain. 07/30/18  Yes [name], FNP  predniSONE (DELTASONE) 20 MG tablet Take 20 mg by mouth 3 (three) times daily.  06/26/18  Yes [provider]  ergocalciferol (DRISDOL) 1.25 MG (50000 UT) capsule Take 1 capsule (50,000 Units total) by mouth once a week. Patient not taking: Reported on 08/18/2018 04/05/18   Lanae Boast, FNP    Physical Exam: Vitals:   08/19/18 0047 08/19/18 0049 08/19/18 0100 08/19/18 0141  BP: (!) 126/98  (!) 131/98 (!) 132/96  Pulse: 75  92 90  Resp: (!) 21  17 20   Temp:      TempSrc:      SpO2: 100%  100% 100%  Weight:  59.4 kg    Height:  5\' 3"  (1.6 m)      Constitutional: NAD, calm, comfortable Vitals:   08/19/18 0047 08/19/18 0049 08/19/18 0100 08/19/18 0141  BP: (!) 126/98  (!) 131/98 (!) 132/96  Pulse: 75  92 90  Resp: (!) 21  17 20   Temp:      TempSrc:      SpO2: 100%  100% 100%  Weight:  59.4 kg    Height:  5\' 3"  (1.6 m)     Eyes: PERRL, lids and conjunctivae normal ENMT: Mucous membranes are moist. Posterior pharynx clear of any exudate or lesions.Normal dentition.  Neck: normal, supple, no masses, no thyromegaly Respiratory: clear to auscultation bilaterally, no wheezing, no crackles. Normal respiratory effort. No accessory muscle use.  Cardiovascular: Regular rate and rhythm, no murmurs / rubs / gallops. No extremity edema. 2+  pedal pulses. No carotid bruits.  Abdomen: no tenderness, no masses palpated. No hepatosplenomegaly. Bowel sounds positive.  Musculoskeletal: no clubbing / cyanosis. No joint deformity upper and lower extremities. Good ROM, no contractures. Normal muscle tone.  Skin: no rashes, lesions, ulcers. No induration Neurologic: CN 2-12 grossly intact. Sensation intact, DTR normal. Strength 5/5 in all 4.  Psychiatric: Normal judgment and insight. Alert and oriented x 3. Normal mood.    Labs on Admission: I have personally reviewed following labs and imaging studies  CBC: Recent Labs  Lab 08/12/18 0320 08/19/18 0048  WBC 6.4 8.4  NEUTROABS 5.4 PENDING  HGB 6.8* 5.4*  HCT 20.3* 16.6*  MCV  97.1 103.8*  PLT 45* 387   Basic Metabolic Panel: Recent Labs  Lab 08/12/18 0320 08/19/18 0048  NA 137 142  K 5.0 3.8  CL 112* 110  CO2 13* 22  GLUCOSE 152* 130*  BUN 59* 43*  CREATININE 2.43* 2.39*  CALCIUM 8.8* 8.2*  MG  --  2.3   GFR: Estimated Creatinine Clearance: 34.7 mL/min (A) (by C-G formula based on SCr of 2.39 mg/dL (H)). Liver Function Tests: Recent Labs  Lab 08/19/18 0048  AST 33  ALT 63*  ALKPHOS 207*  BILITOT 0.8  PROT 5.3*  ALBUMIN 2.6*   No results for input(s): LIPASE, AMYLASE in the last 168 hours. No results for input(s): AMMONIA in the last 168 hours. Coagulation Profile: No results for input(s): INR, PROTIME in the last 168 hours. Cardiac Enzymes: No results for input(s): CKTOTAL, CKMB, CKMBINDEX, TROPONINI in the last 168 hours. BNP (last 3 results) No results for input(s): PROBNP in the last 8760 hours. HbA1C: No results for input(s): HGBA1C in the last 72 hours. CBG: No results for input(s): GLUCAP in the last 168 hours. Lipid Profile: No results for input(s): CHOL, HDL, LDLCALC, TRIG, CHOLHDL, LDLDIRECT in the last 72 hours. Thyroid Function Tests: No results for input(s): TSH, T4TOTAL, FREET4, T3FREE, THYROIDAB in the last 72 hours. Anemia Panel:  Recent Labs    08/19/18 0048  RETICCTPCT 5.7*   Urine analysis:    Component Value Date/Time   COLORURINE YELLOW 08/09/2018 0535   APPEARANCEUR CLEAR 08/09/2018 0535   LABSPEC 1.009 08/09/2018 0535   PHURINE 7.0 08/09/2018 0535   GLUCOSEU >=500 (A) 08/09/2018 0535   HGBUR SMALL (A) 08/09/2018 0535   HGBUR small 03/10/2010 [place] 08/09/2018 0535   BILIRUBINUR neg 07/30/2018 1511   KETONESUR NEGATIVE 08/09/2018 0535   PROTEINUR >=300 (A) 08/09/2018 0535   UROBILINOGEN 0.2 07/30/2018 1511   UROBILINOGEN 0.2 05/11/2017 1335   NITRITE NEGATIVE 08/09/2018 0535   LEUKOCYTESUR NEGATIVE 08/09/2018 0535    Radiological Exams on Admission: No results found.  EKG: Independently reviewed. Normal sinus rhythm, rate 97, QTc 416, QRS 79.  No concerning ST elevation/depressions or T wave inversions.  Assessment/Plan Principal Problem:   Sickle cell pain crisis (HCC) Active Problems:   Chronic kidney disease   Polysubstance abuse (HCC)   Chest pain   Nephrotic syndrome  Sickle cell pain crisis Patient presenting with 1 day onset with progression of bilateral lower extremity discomfort.  Patient was noted to have a hemoglobin of 5.4 with elevated reticulocyte count of 5.7.  Denies any narcotic use since discharge and has only been taking reported Tylenol. --ED provider ordered transfusion of 2 unit PRBCs --IV fluid hydration with D5 half-normal saline at 75 mL's per hour --OxyContin 15 mg every 12 hours --Oxycodone IR 10 mg every 6 hours as needed moderate pain --Dilaudid 2 mg IV every 3 hours as needed for severe breakthrough pain --Resume home folic acid and hydroxyurea --Supportive care  Anemia Hemoglobin 5.4, MCV 103.8.  Reticulocyte count elevated at 5.7. --Transfusion 2 units PRBCs as above  CKD stage III/nephrotic syndrome Creatinine 2.39 on admission.  Was 2.43 on 08/12/2018 at time of previous discharge.  Appears to be at baseline.  Follows with  nephrology outpatient. --Continue prednisone 2 mg p.o. 3 times daily --Renally dose all medications, avoid nephrotoxins --Monitor renal function daily  History of polysubstance abuse Most recent admission with UDS positive for cocaine. --Repeat UDS   DVT prophylaxis: Heparin Code Status: Full code  Family Communication: None Disposition Plan: Home when medically stable Consults called: None Admission status: Observation    J [place] DO [facility] Pager [phone]  If 7PM-7AM, please contact night-coverage [email or web address] Password [email or web address]  08/19/2018, 1:51 AM

## 2018-08-19 NOTE — ED Notes (Signed)
Blood consent form signed and at bedside  

## 2018-08-19 NOTE — ED Notes (Signed)
ED TO INPATIENT HANDOFF REPORT  Name/Age/Gender [name] [name]. 38 y.o. male  Code Status Code Status History    Date Active Date Inactive Code Status Order ID Comments User Context   08/09/2018 1352 08/12/2018 1753 Full Code [id number]  [name], [place] [facility]   07/22/2018 0342 07/24/2018 1522 Full Code [id number]  Harvie Bridge, DO Inpatient   07/03/2018 2120 07/06/2018 2032 Full Code [id number]  [name], MD ED   05/05/2018 2214 05/08/2018 1455 Full Code [id number]  [name], MD ED   03/07/2018 0043 03/11/2018 1656 Full Code [id number]  Ivor Costa, MD ED   01/21/2018 2008 01/23/2018 1834 Full Code [id number]  Jonetta Osgood, MD ED   09/19/2017 2214 09/22/2017 1633 Full Code [id number]  Bethena Roys, MD ED   07/23/2017 2234 07/26/2017 1527 Full Code [id number]  [place], [place], DO Inpatient   05/12/2017 1514 05/18/2017 1850 Full Code [id number]  [name], MD Inpatient   04/20/2017 0205 05/03/2017 1855 Full Code [id number]  [name], MD ED   04/07/2017 1712 04/09/2017 1238 Full Code [id number]  Marene Lenz, MD ED   02/27/2017 2140 03/04/2017 1508 Full Code [id number]  Jani Gravel, MD ED   12/13/2016 1812 12/21/2016 1659 Full Code [id number]  Leana Gamer, MD ED   10/14/2016 1944 10/18/2016 1637 Full Code [id number]  Jani Gravel, MD ED   08/02/2016 0124 08/04/2016 1731 Full Code [id number]  Norval Morton, MD ED   02/26/2016 2148 02/27/2016 1750 Full Code [id number]  Etta Quill, DO ED   03/11/2015 1636 03/15/2015 1544 Full Code [id number]  Janora Norlander, DO ED   11/21/2014 0308 11/24/2014 2020 Full Code [id number]  [place], York Ram, MD Inpatient   08/18/2014 0148 08/20/2014 1820 Full Code [id number]  Archie Patten, MD Inpatient   03/22/2014 2315 03/24/2014 1826 Full Code [id number]  Toy Baker, MD ED   03/19/2014 2048 03/20/2014 2250 Full Code [id number]  Lorna Few, DO Inpatient   12/26/2013 0344 12/29/2013 2103 Full Code [id number]   Timmothy Euler, MD ED   07/28/2013 1833 07/31/2013 1729 Full Code [id number]  Coral Spikes, DO Inpatient   05/09/2013 1926 05/10/2013 1644 Full Code [id number]  Hilton Sinclair, MD ED   01/24/2013 0143 01/26/2013 1931 Full Code [id number]  Leone Haven, MD Inpatient   09/25/2012 0846 09/26/2012 2020 Full Code [id number]  Ma Hillock, DO ED   Advance Care Planning Activity      Home/SNF/Other Home  Chief Complaint Sickle Cell Pain Crisis/ Foot Pain / Chest Pain   Level of Care/Admitting Diagnosis ED Disposition    ED Disposition Condition [facility]: [place] [[id number]]  Level of Care: Telemetry [5]  Admit to tele based on following criteria: Monitor QTC interval  Covid Evaluation: Asymptomatic Screening Protocol (No Symptoms)  Diagnosis: Sickle cell pain crisis [facility]) [7654650]  Admitting Physician: [place] ERIC J [[id number]]  Attending Physician: [place] ERIC J [[id number]]  PT Class (Do Not Modify): Observation [104]  PT Acc Code (Do Not Modify): Observation [10022]       Medical History Past Medical History:  Diagnosis Date  . Arachnoid Cyst of brain bilaterally    "2 really small ones in the back of my head; inside; saw them w/MRI" (09/25/2012)  . Bacterial pneumonia ~ 2012   "caught it here in the hospital" (09/25/2012)  . Chronic kidney disease    "from my sickle cell" (  09/25/2012)  . CKD (chronic kidney disease), stage II   . GERD (gastroesophageal reflux disease)    "after I eat alot of spicey foods" (09/25/2012)  . Gynecomastia, male 07/10/2012  . History of blood transfusion    "always related to sickle cell crisis" (09/25/2012)  . 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   Noted in noted from Hematology notes at [facility]  . Migraines    "take RX qd to prevent them" (09/25/2012)  . Opioid dependence with withdrawal ([place]) 08/30/2012  . Sickle cell anemia (HCC)   . Sickle cell crisis ([place]) 09/25/2012  .  Sickle cell nephropathy ([place]) 07/10/2012  . Sinus tachycardia   . Tachycardia with heart rate 121-140 beats per minute with ambulation 08/04/2016    Allergies Allergies  Allergen Reactions  . Nsaids Other (See Comments)    Kidney disease  . Morphine And Related Other (See Comments)    "real bad headaches"    IV Location/Drains/Wounds Patient Lines/Drains/Airways Status   Active Line/Drains/Airways    Name:   Placement date:   Placement time:   Site:   Days:   Peripheral IV 08/19/18 Left Hand   08/19/18    0049    Hand   less than 1   Peripheral IV 08/19/18 Right;Upper Arm   08/19/18    0125    Arm   less than 1          Labs/Imaging Results for orders placed or performed during the hospital encounter of 08/18/18 (from the past 48 hour(s))  Comprehensive metabolic panel     Status: Abnormal   Collection Time: 08/19/18 12:48 AM  Result Value Ref Range   Sodium 142 135 - 145 mmol/L   Potassium 3.8 3.5 - 5.1 mmol/L   Chloride 110 98 - 111 mmol/L   CO2 22 22 - 32 mmol/L   Glucose, Bld 130 (H) 70 - 99 mg/dL   BUN 43 (H) 6 - 20 mg/dL   Creatinine, Ser 2.39 (H) 0.61 - 1.24 mg/dL   Calcium 8.2 (L) 8.9 - 10.3 mg/dL   Total Protein 5.3 (L) 6.5 - 8.1 g/dL   Albumin 2.6 (L) 3.5 - 5.0 g/dL   AST 33 15 - 41 U/L   ALT 63 (H) 0 - 44 U/L   Alkaline Phosphatase 207 (H) 38 - 126 U/L   Total Bilirubin 0.8 0.3 - 1.2 mg/dL   GFR calc non Af Amer 34 (L) >60 mL/min   GFR calc Af Amer 39 (L) >60 mL/min   Anion gap 10 5 - 15    Comment: Performed at [facility], [place] [address] [place], [place] [address]  CBC with Differential     Status: Abnormal (Preliminary result)   Collection Time: 08/19/18 12:48 AM  Result Value Ref Range   WBC 8.4 4.0 - 10.5 K/uL   RBC 1.60 (L) 4.22 - 5.81 MIL/uL   Hemoglobin 5.4 (LL) 13.0 - 17.0 g/dL    Comment: REPEATED TO VERIFY THIS CRITICAL RESULT HAS VERIFIED AND BEEN CALLED TO A,HODGES BY AISHA MOHAMED ON 07 06 2020 AT 0111, AND HAS BEEN  READ BACK.     HCT 16.6 (L) 39.0 - 52.0 %   MCV 103.8 (H) 80.0 - 100.0 fL   MCH 33.8 26.0 - 34.0 pg   MCHC 32.5 30.0 - 36.0 g/dL   RDW 25.7 (H) 11.5 - 15.5 %   Platelets 151 150 - 400 K/uL   nRBC 7.4 (H) 0.0 - 0.2 %  Comment: Performed at [facility], [place] [address], [place], [place] [address]   Neutrophils Relative % PENDING %   Neutro Abs PENDING 1.7 - 7.7 K/uL   Band Neutrophils PENDING %   Lymphocytes Relative PENDING %   Lymphs Abs PENDING 0.7 - 4.0 K/uL   Monocytes Relative PENDING %   Monocytes Absolute PENDING 0.1 - 1.0 K/uL   Eosinophils Relative PENDING %   Eosinophils Absolute PENDING 0.0 - 0.5 K/uL   Basophils Relative PENDING %   Basophils Absolute PENDING 0.0 - 0.1 K/uL   WBC Morphology PENDING    RBC Morphology PENDING    Smear Review PENDING    Other PENDING %   nRBC PENDING 0 /100 WBC   Metamyelocytes Relative PENDING %   Myelocytes PENDING %   Promyelocytes Relative PENDING %   Blasts PENDING %  Reticulocytes     Status: Abnormal   Collection Time: 08/19/18 12:48 AM  Result Value Ref Range   Retic Ct Pct 5.7 (H) 0.4 - 3.1 %   RBC. 1.60 (L) 4.22 - 5.81 MIL/uL   Retic Count, Absolute 91.4 19.0 - 186.0 K/uL   Immature Retic Fract 37.5 (H) 2.3 - 15.9 %    Comment: Performed at [facility], [place] [address], [place], [place] [address]  Magnesium     Status: None   Collection Time: 08/19/18 12:48 AM  Result Value Ref Range   Magnesium 2.3 1.7 - 2.4 mg/dL    Comment: Performed at [facility], [place] [address] [place], [place] [address]   No results found.  Pending Labs Unresulted Labs (From admission, onward)    Start     Ordered   08/19/18 0139  Novel Coronavirus,NAA,(SEND-OUT TO REF LAB - TAT 24-48 hrs); Hosp Order  ([place])  Once,   STAT    Question:  Pre-procedural testing  Answer:  Yes   08/19/18 0138   08/19/18 0114  Type and screen [place]  Once,   STAT     Comments: [place]    08/19/18 0114   08/19/18 0114  Prepare RBC  (Adult Blood Administration - PRBC)  Once,   R    Question Answer Comment  # of Units 2 units   Transfusion Indications Symptomatic Anemia   If emergent release call blood bank Not emergent release      08/19/18 0114   Signed and Held  CBC  ([place])  Once,   R    Comments: Baseline for heparin therapy IF NOT ALREADY DRAWN.  Notify MD if PLT < 100 K.    Signed and Held   Signed and Held  Creatinine, serum  ([place])  Once,   R    Comments: Baseline for heparin therapy IF NOT ALREADY DRAWN.    Signed and Held   Signed and Held  Lactate dehydrogenase  Once,   R     Signed and Held   Signed and Held  CBC  Daily,   R     Signed and Held   Signed and Held  Reticulocytes  Tomorrow morning,   R     Signed and Held   Signed and Held  Lactate dehydrogenase  Tomorrow morning,   R     Signed and Held   Signed and Held  Comprehensive metabolic panel  Tomorrow morning,   R     Signed and Held   Signed and Held  Urine rapid  drug screen (hosp performed)  ONCE - STAT,   R     Signed and Held          Vitals/Pain Today's Vitals   08/19/18 0100 08/19/18 0141 08/19/18 0200 08/19/18 0222  BP: (!) 131/98 (!) 132/96 (!) 125/99   Pulse: 92 90 74   Resp: 17 20 (!) 22   Temp:      TempSrc:      SpO2: 100% 100% 99%   Weight:      Height:      PainSc:  10-Worst pain ever  10-Worst pain ever    Isolation Precautions No active isolations  Medications Medications  HYDROmorphone (DILAUDID) injection 2 mg (has no administration in time range)    Or  HYDROmorphone (DILAUDID) injection 2 mg (has no administration in time range)  diphenhydrAMINE (BENADRYL) injection 25 mg (25 mg Intravenous Given 08/19/18 0052)  0.9 %  sodium chloride infusion (has no administration in time range)  sodium chloride flush (NS) 0.9 % injection 3 mL (3 mLs Intravenous Given 08/19/18 0050)  HYDROmorphone (DILAUDID) injection 2  mg (2 mg Intravenous Given 08/19/18 0050)    Or  HYDROmorphone (DILAUDID) injection 2 mg ( Subcutaneous See Alternative 08/19/18 0050)  HYDROmorphone (DILAUDID) injection 2 mg (2 mg Intravenous Given 08/19/18 0139)    Or  HYDROmorphone (DILAUDID) injection 2 mg ( Subcutaneous See Alternative 08/19/18 0139)  HYDROmorphone (DILAUDID) injection 2 mg (2 mg Intravenous Given 08/19/18 0222)    Or  HYDROmorphone (DILAUDID) injection 2 mg ( Subcutaneous See Alternative 08/19/18 0222)    Mobility walks

## 2018-08-20 ENCOUNTER — Observation Stay (HOSPITAL_COMMUNITY): Payer: Medicare Other

## 2018-08-20 DIAGNOSIS — D5700 Hb-SS disease with crisis, unspecified: Secondary | ICD-10-CM | POA: Diagnosis not present

## 2018-08-20 DIAGNOSIS — Z79899 Other long term (current) drug therapy: Secondary | ICD-10-CM | POA: Diagnosis not present

## 2018-08-20 DIAGNOSIS — D638 Anemia in other chronic diseases classified elsewhere: Secondary | ICD-10-CM | POA: Diagnosis present

## 2018-08-20 DIAGNOSIS — Z7952 Long term (current) use of systemic steroids: Secondary | ICD-10-CM | POA: Diagnosis not present

## 2018-08-20 DIAGNOSIS — E871 Hypo-osmolality and hyponatremia: Secondary | ICD-10-CM | POA: Diagnosis present

## 2018-08-20 DIAGNOSIS — Z1159 Encounter for screening for other viral diseases: Secondary | ICD-10-CM | POA: Diagnosis not present

## 2018-08-20 DIAGNOSIS — F1123 Opioid dependence with withdrawal: Secondary | ICD-10-CM | POA: Diagnosis present

## 2018-08-20 DIAGNOSIS — F17210 Nicotine dependence, cigarettes, uncomplicated: Secondary | ICD-10-CM | POA: Diagnosis present

## 2018-08-20 DIAGNOSIS — F1410 Cocaine abuse, uncomplicated: Secondary | ICD-10-CM | POA: Diagnosis present

## 2018-08-20 DIAGNOSIS — N08 Glomerular disorders in diseases classified elsewhere: Secondary | ICD-10-CM | POA: Diagnosis present

## 2018-08-20 DIAGNOSIS — G894 Chronic pain syndrome: Secondary | ICD-10-CM | POA: Diagnosis present

## 2018-08-20 DIAGNOSIS — Z9049 Acquired absence of other specified parts of digestive tract: Secondary | ICD-10-CM | POA: Diagnosis not present

## 2018-08-20 DIAGNOSIS — Z87441 Personal history of nephrotic syndrome: Secondary | ICD-10-CM | POA: Diagnosis not present

## 2018-08-20 DIAGNOSIS — N183 Chronic kidney disease, stage 3 (moderate): Secondary | ICD-10-CM | POA: Diagnosis present

## 2018-08-20 DIAGNOSIS — Z886 Allergy status to analgesic agent status: Secondary | ICD-10-CM | POA: Diagnosis not present

## 2018-08-20 DIAGNOSIS — K219 Gastro-esophageal reflux disease without esophagitis: Secondary | ICD-10-CM | POA: Diagnosis present

## 2018-08-20 DIAGNOSIS — Z803 Family history of malignant neoplasm of breast: Secondary | ICD-10-CM | POA: Diagnosis not present

## 2018-08-20 DIAGNOSIS — M19072 Primary osteoarthritis, left ankle and foot: Secondary | ICD-10-CM | POA: Diagnosis not present

## 2018-08-20 DIAGNOSIS — Z885 Allergy status to narcotic agent status: Secondary | ICD-10-CM | POA: Diagnosis not present

## 2018-08-20 LAB — NOVEL CORONAVIRUS, NAA (HOSP ORDER, SEND-OUT TO REF LAB; TAT 18-24 HRS): SARS-CoV-2, NAA: NOT DETECTED

## 2018-08-20 LAB — URIC ACID: Uric Acid, Serum: 6.5 mg/dL (ref 3.7–8.6)

## 2018-08-20 LAB — CBC
HCT: 22.2 % — ABNORMAL LOW (ref 39.0–52.0)
Hemoglobin: 7.5 g/dL — ABNORMAL LOW (ref 13.0–17.0)
MCH: 33.2 pg (ref 26.0–34.0)
MCHC: 33.8 g/dL (ref 30.0–36.0)
MCV: 98.2 fL (ref 80.0–100.0)
Platelets: UNDETERMINED 10*3/uL (ref 150–400)
RBC: 2.26 MIL/uL — ABNORMAL LOW (ref 4.22–5.81)
RDW: 22.3 % — ABNORMAL HIGH (ref 11.5–15.5)
WBC: 9.5 10*3/uL (ref 4.0–10.5)
nRBC: 8.6 % — ABNORMAL HIGH (ref 0.0–0.2)

## 2018-08-20 LAB — BASIC METABOLIC PANEL
Anion gap: 7 (ref 5–15)
BUN: 39 mg/dL — ABNORMAL HIGH (ref 6–20)
CO2: 21 mmol/L — ABNORMAL LOW (ref 22–32)
Calcium: 8.8 mg/dL — ABNORMAL LOW (ref 8.9–10.3)
Chloride: 111 mmol/L (ref 98–111)
Creatinine, Ser: 2.31 mg/dL — ABNORMAL HIGH (ref 0.61–1.24)
GFR calc Af Amer: 41 mL/min — ABNORMAL LOW (ref >60)
GFR calc non Af Amer: 35 mL/min — ABNORMAL LOW (ref >60)
Glucose, Bld: 149 mg/dL — ABNORMAL HIGH (ref 70–99)
Potassium: 4.6 mmol/L (ref 3.5–5.1)
Sodium: 139 mmol/L (ref 135–145)

## 2018-08-20 NOTE — BH Specialist Note (Signed)
Reason for Referral: [name] [name]. is a 38 y.o. male  Pt was referred by NP for: substance use   Pt reports the following concerns: foot pain/sickle cell pain  Plan: 1. Addressed today: CSW met with patient at bedside. Introduced self and role at the [facility]. Patient did not wish to discuss substance use today, though did state he wishes to change his substance use behaviors. Advised that CSW available for support and resources. 2. Follow up: Will continue to follow from clinic for support.

## 2018-08-20 NOTE — Progress Notes (Signed)
Subjective: [name] [name], a 38 year old male with a medical history significant for sickle cell disease, chronic pain syndrome, opiate dependence, chronic kidney disease stage III with sickle cell nephropathy, history of polysubstance abuse was admitted for sickle cell pain crisis. Patient states that pain has improved minimally overnight.  Pain is localized to left heel and left shin.  Patient characterizes pain as constant and throbbing.  Pain is worsened with weightbearing.  Current pain intensity is 8-9/10.  Patient is afebrile and maintaining oxygen saturation at 98% on RA.  Patient denies headache, dizziness, paresthesias, dysuria, nausea, vomiting, or diarrhea.  Objective:  Vital signs in last 24 hours:  Vitals:   08/20/18 0534 08/20/18 0834 08/20/18 0836 08/20/18 0904  BP: 124/86 130/85 (!) 137/93   Pulse: 72 89 74   Resp: 18 16  17   Temp: 98.1 F (36.7 C) 98 F (36.7 C) 98.1 F (36.7 C)   TempSrc: Oral Oral Oral   SpO2: 94% 99% 98% 98%  Weight:      Height:        Intake/Output from previous day:   Intake/Output Summary (Last 24 hours) at 08/20/2018 1026 Last data filed at 08/20/2018 0600 Gross per 24 hour  Intake 2635.58 ml  Output 1100 ml  Net 1535.58 ml   Physical Exam Constitutional:      Appearance: Normal appearance.  HENT:     Head: Normocephalic.     Mouth/Throat:     Mouth: Mucous membranes are moist.     Pharynx: Oropharynx is clear.  Eyes:     General: Scleral icterus present.     Conjunctiva/sclera: Conjunctivae normal.     Pupils: Pupils are equal, round, and reactive to light.  Cardiovascular:     Rate and Rhythm: Normal rate.     Pulses: Normal pulses.  Pulmonary:     Effort: Pulmonary effort is normal.     Breath sounds: Normal breath sounds.  Abdominal:     General: Bowel sounds are normal.     Palpations: Abdomen is soft.  Musculoskeletal:     Left foot: Decreased range of motion. Tenderness present. No swelling, bunion or foot  drop.       Feet:  Feet:     Left foot:     Skin integrity: No ulcer, blister or skin breakdown.  Neurological:     Mental Status: He is alert.  Psychiatric:        Mood and Affect: Mood normal.        Thought Content: Thought content normal.        Judgment: Judgment normal.      Lab Results:  Basic Metabolic Panel:    Component Value Date/Time   NA 143 08/19/2018 0643   NA 138 06/13/2017 1102   K 4.9 08/19/2018 0643   CL 115 (H) 08/19/2018 0643   CO2 19 (L) 08/19/2018 0643   BUN 41 (H) 08/19/2018 0643   BUN 18 06/13/2017 1102   CREATININE 2.23 (H) 08/19/2018 0643   CREATININE 1.45 (H) 05/12/2014 1430   GLUCOSE 121 (H) 08/19/2018 0643   CALCIUM 8.6 (L) 08/19/2018 0643   CBC:    Component Value Date/Time   WBC 10.3 08/19/2018 0904   HGB 7.6 (L) 08/19/2018 0904   HGB 7.8 (L) 03/28/2018 0942   HCT 21.9 (L) 08/19/2018 0904   HCT 22.8 (L) 03/28/2018 0942   PLT PLATELET CLUMPS NOTED ON SMEAR, UNABLE TO ESTIMATE 08/19/2018 0904   PLT 267 03/28/2018 0942  MCV 96.1 08/19/2018 0904   MCV 115 (H) 03/28/2018 0942   NEUTROABS 6.5 08/19/2018 0048   NEUTROABS 2.2 03/28/2018 0942   LYMPHSABS 1.5 08/19/2018 0048   LYMPHSABS 3.3 (H) 03/28/2018 0942   MONOABS 0.3 08/19/2018 0048   EOSABS 0.0 08/19/2018 0048   EOSABS 0.1 03/28/2018 0942   BASOSABS 0.0 08/19/2018 0048   BASOSABS 0.0 03/28/2018 0942    No results found for this or any previous visit (from the past 240 hour(s)).  Studies/Results: No results found.  Medications: Scheduled Meds: . cholecalciferol  2,000 Units Oral Daily  . folic acid  1 mg Oral Daily  . heparin  5,000 Units Subcutaneous Q8H  . HYDROmorphone   Intravenous Q4H  . hydroxyurea  1,500 mg Oral Daily  . oxyCODONE  15 mg Oral Q12H  . predniSONE  20 mg Oral TID  . senna-docusate  1 tablet Oral BID  . cyanocobalamin  2,000 mcg Oral Daily   Continuous Infusions: . dextrose 5 % and 0.45% NaCl 75 mL/hr at 08/20/18 0549   PRN Meds:.naloxone  **AND** sodium chloride flush, nortriptyline, oxyCODONE, polyethylene glycol  Assessment/Plan: Principal Problem:   Sickle cell pain crisis (HCC) Active Problems:   Chronic kidney disease   Polysubstance abuse (HCC)   Chest pain   Nephrotic syndrome    Sickle cell disease with pain crisis: Continue IV fluids at [facility] IV Dilaudid via PCA, no changes in settings on today Oxycodone 10 mg every 6 hours as needed for moderate to severe breakthrough pain Maintain oxygen saturation above 90%, 2 L supplemental oxygen as needed  Chronic pain syndrome: Continue OxyContin 15 mg every 12 hours Oxycodone 10 mg every 6 hours as needed for moderate to severe breakthrough pain  Anemia of chronic disease: On admission, hemoglobin was 5.4, which was below patient's baseline.  Patient was transfused 2 units of packed red blood cells.  Hemoglobin has increased to 7.5, which is consistent with his baseline. Continue to follow CBC  CKD stage III/nephrotic syndrome: Creatinine remained stable.  Consistent with patient's baseline.  He is followed by outpatient nephrology. Continue prednisone.  Avoid all nephrotoxins. Continue to follow renal function daily.  History of polysubstance abuse: UDS has not been collected, will review results as they become available.  Left foot pain:  Patient continues to complain of left foot pain that is worsened with weight bearing.  Will review left foot xray as results become available  Uric acid pending   Code Status: Full Code Family Communication: N/A Disposition Plan: Not yet ready for discharge  [place], MSN, FNP-C Patient [place], [place] [address] [phone]  If 5PM-7AM, please contact night-coverage.  08/20/2018, 10:26 AM  LOS: 0 days

## 2018-08-21 LAB — BASIC METABOLIC PANEL
Anion gap: 8 (ref 5–15)
BUN: 37 mg/dL — ABNORMAL HIGH (ref 6–20)
CO2: 18 mmol/L — ABNORMAL LOW (ref 22–32)
Calcium: 8.9 mg/dL (ref 8.9–10.3)
Chloride: 109 mmol/L (ref 98–111)
Creatinine, Ser: 2.21 mg/dL — ABNORMAL HIGH (ref 0.61–1.24)
GFR calc Af Amer: 43 mL/min — ABNORMAL LOW (ref >60)
GFR calc non Af Amer: 37 mL/min — ABNORMAL LOW (ref >60)
Glucose, Bld: 128 mg/dL — ABNORMAL HIGH (ref 70–99)
Potassium: 4.2 mmol/L (ref 3.5–5.1)
Sodium: 135 mmol/L (ref 135–145)

## 2018-08-21 LAB — CBC
HCT: 25.8 % — ABNORMAL LOW (ref 39.0–52.0)
Hemoglobin: 8.3 g/dL — ABNORMAL LOW (ref 13.0–17.0)
MCH: 33.7 pg (ref 26.0–34.0)
MCHC: 32.2 g/dL (ref 30.0–36.0)
MCV: 104.9 fL — ABNORMAL HIGH (ref 80.0–100.0)
Platelets: 151 10*3/uL (ref 150–400)
RBC: 2.46 MIL/uL — ABNORMAL LOW (ref 4.22–5.81)
RDW: 23.3 % — ABNORMAL HIGH (ref 11.5–15.5)
WBC: 10.1 10*3/uL (ref 4.0–10.5)
nRBC: 10.4 % — ABNORMAL HIGH (ref 0.0–0.2)

## 2018-08-21 MED ORDER — OXYCODONE HCL 10 MG PO TABS: 10.0000 mg | ORAL_TABLET | Freq: Four times a day (QID) | ORAL | 0 refills | Status: DC | PRN

## 2018-08-21 NOTE — Discharge Instructions (Signed)
Resume all home medications.  Follow up with PCP as previously  scheduled.   Discussed the importance of drinking 64 ounces of water daily to  help prevent pain crises, it is important to drink plenty of water throughout the day. This is because dehydration of red blood cells may lead further sickling.   Avoid all stressors that precipitate sickle cell pain crisis.     The patient was given clear instructions to go to ER or return to medical center if symptoms do not improve, worsen or new problems develop.   Sickle Cell Anemia, Adult  Sickle cell anemia is a condition where your red blood cells are shaped like sickles. Red blood cells carry oxygen through the body. Sickle-shaped cells do not live as long as normal red blood cells. They also clump together and block blood from flowing through the blood vessels. This prevents the body from getting enough oxygen. Sickle cell anemia causes organ damage and pain. It also increases the risk of infection. Follow these instructions at home: Medicines  Take over-the-counter and prescription medicines only as told by your doctor.  If you were prescribed an antibiotic medicine, take it as told by your doctor. Do not stop taking the antibiotic even if you start to feel better.  If you develop a fever, do not take medicines to lower the fever right away. Tell your doctor about the fever. Managing pain, stiffness, and swelling  Try these methods to help with pain: ? Use a heating pad. ? Take a warm bath. ? Distract yourself, such as by watching TV. Eating and drinking  Drink enough fluid to keep your pee (urine) clear or pale yellow. Drink more in hot weather and during exercise.  Limit or avoid alcohol.  Eat a healthy diet. Eat plenty of fruits, vegetables, whole grains, and lean protein.  Take vitamins and supplements as told by your doctor. Traveling  When traveling, keep these with you: ? Your medical information. ? The names of your  doctors. ? Your medicines.  If you need to take an airplane, talk to your doctor first. Activity  Rest often.  Avoid exercises that make your heart beat much faster, such as jogging. General instructions  Do not use products that have nicotine or tobacco, such as cigarettes and e-cigarettes. If you need help quitting, ask your doctor.  Consider wearing a medical alert bracelet.  Avoid being in high places (high altitudes), such as mountains.  Avoid very hot or cold temperatures.  Avoid places where the temperature changes a lot.  Keep all follow-up visits as told by your doctor. This is important. Contact a doctor if:  A joint hurts.  Your feet or hands hurt or swell.  You feel tired (fatigued). Get help right away if:  You have symptoms of infection. These include: ? Fever. ? Chills. ? Being very tired. ? Irritability. ? Poor eating. ? Throwing up (vomiting).  You feel dizzy or faint.  You have new stomach pain, especially on the left side.  You have a an erection (priapism) that lasts more than 4 hours.  You have numbness in your arms or legs.  You have a hard time moving your arms or legs.  You have trouble talking.  You have pain that does not go away when you take medicine.  You are short of breath.  You are breathing fast.  You have a long-term cough.  You have pain in your chest.  You have a bad headache.  You  have a stiff neck.  Your stomach looks bloated even though you did not eat much.  Your skin is pale.  You suddenly cannot see well. Summary  Sickle cell anemia is a condition where your red blood cells are shaped like sickles.  Follow your doctor's advice on ways to manage pain, food to eat, activities to do, and steps to take for safe travel.  Get medical help right away if you have any signs of infection, such as a fever. This information is not intended to replace advice given to you by your health care provider. Make sure  you discuss any questions you have with your health care provider. Document Released: 11/20/2012 Document Revised: 05/24/2018 Document Reviewed: 03/07/2016 Elsevier Patient Education  2020 [employer].

## 2018-08-21 NOTE — Discharge Summary (Signed)
Physician Discharge Summary  [name] [name]. DUK:[id number] DOB: 19-Sep-1980 DOA: 08/18/2018  PCP: [name], MD  Admit date: 08/18/2018  Discharge date: 08/21/2018  Discharge Diagnoses:  Principal Problem:   Sickle cell pain crisis ([place]) Active Problems:   Chronic kidney disease   Polysubstance abuse ([name])   Chest pain   Sickle cell crisis ([name])   Nephrotic syndrome   Discharge Condition: Stable  Disposition:  Follow-up Information    Buford Dresser, MD .   Specialty: Cardiology Contact information: [address] [place] [address] [phone]        [name], FNP Follow up.   Specialty: Family Medicine Contact information: [place]. [place] [address] [phone]          Pt is discharged home in good condition and is to follow up with [name], MD this week to have labs evaluated. [name] is instructed to increase activity slowly and balance with rest for the next few days, and use prescribed medication to complete treatment of pain  Diet: Regular Wt Readings from Last 3 Encounters:  08/19/18 59.4 kg  08/09/18 59.4 kg  07/30/18 59.4 kg    History of present illness:  [name] [name], a 38 year old male with a medical history significant for sickle cell disease, CKD stage III/nephrotic syndrome, chronic pain syndrome, opiate dependence, history of polysubstance abuse with cocaine presented to ER complaining of left foot/left leg pain since yesterday.  Patient was recently discharged from [facility] 1 week ago.  Patient states that he had been out of pain medication at home and has only been utilizing Tylenol.  1 day prior to admission patient started developing left foot pain, which is progressed up the leg and now he complains of bilateral leg pain.  Patient also endorses an episode of chest discomfort this a.m., which has since subsided.  Patient says that this is his  typical disease course in regards to sickle cell.  Patient specifically denies headache, fever, chills, night sweats, nausea, vomiting, diarrhea, shortness of breath, abdominal pain, or paresthesias.  ER course: Vital signs: Temperature 99.2, heart rate 75, RR 21, BP 126/98, and maintaining oxygen saturation at 100% on RA. WBC count 8.4, hemoglobin 5.4, platelets 151. Sodium 142, potassium 3.8.  Patient's creatinine is 2.39, consistent with his baseline. Hemoglobin 5.4, below baseline, patient transfused 2 units of PRBCs. Patient admitted to [facility] for management of sickle cell pain crisis.  Hospital Course: Patient admitted to [facility] for sickle cell pain crisis.  Pain was localized to left foot primarily.  Patient also endorsed pain in bilateral lower extremities. Pain was managed with IV Dilaudid via PCA.  PCA weaned appropriately.  Patient transition to home medications that included oxycodone and OxyContin.  OxyContin was continued throughout admission without interruption.  Reviewed PDMP.  OxyContin is not due until 08/29/2018.  Prescription for oxycodone 10 mg every 6 hours as needed for moderate pain was sent to patient's pharmacy #[id number].  Patient will follow-up with this provider on Tuesday, 08/27/2018 for medication management.  Patient also has appointment with hematology scheduled.  Patient's pain was localized to left foot.  Patient underwent left foot x-ray which showed a vascular necrosis.  He warrants outpatient referral to orthopedic services.  Anemia of chronic disease: On admission: Hemoglobin 5.4 which was below baseline.  Patient transfused 2 units of PRBCs.  Hemoglobin 8.3 prior to discharge, consistent with patient's baseline.  Will repeat CBC at follow-up  in 1 week.  Patient has a history of CKD 3/nephrotic syndrome: Prednisone per nephrology continued during admission without interruption.  Patient has appointment scheduled to follow-up with nephrology as scheduled.  Creatinine  improved from baseline  Patient is overall pain intensity decreased to 5/10.  Patient states that he can manage at home on previously prescribed medication regimen.  Patient is aware of all upcoming appointments. He is afebrile and maintaining oxygen saturation at 100% on RA. Patient alert, oriented, and ambulating without assistance.  Patient was discharged home today in a hemodynamically stable condition.   Discharge Exam: Vitals:   08/21/18 0825 08/21/18 1200  BP: (!) 141/101 (!) 142/88  Pulse: 84 65  Resp: 16 18  Temp: 98.3 F (36.8 C) 98.5 F (36.9 C)  SpO2: 100% 100%   Vitals:   08/21/18 0400 08/21/18 0420 08/21/18 0825 08/21/18 1200  BP:  (!) 127/95 (!) 141/101 (!) 142/88  Pulse:  69 84 65  Resp: 18 16 16 18   Temp:  98.5 F (36.9 C) 98.3 F (36.8 C) 98.5 F (36.9 C)  TempSrc:  Oral Oral Oral  SpO2: 99% 95% 100% 100%  Weight:      Height:        General appearance : Awake, alert, not in any distress. Speech Clear. Not toxic looking HEENT: Atraumatic and Normocephalic, pupils equally reactive to light and accomodation Neck: Supple, no JVD. No cervical lymphadenopathy.  Chest: Good air entry bilaterally, no added sounds  CVS: S1 S2 regular, no murmurs.  Abdomen: Bowel sounds present, Non tender and not distended with no gaurding, rigidity or rebound. Extremities: B/L Lower Ext shows no edema, both legs are warm to touch Neurology: Awake alert, and oriented X 3, CN II-XII intact, Non focal Skin: No Rash  Discharge Instructions  Discharge Instructions    Discharge patient   Complete by: As directed    Discharge disposition: 01-Home or Self Care   Discharge patient date: 08/21/2018     Allergies as of 08/21/2018      Reactions   Nsaids Other (See Comments)   Kidney disease   Morphine And Related Other (See Comments)   "real bad headaches"      Medication List    TAKE these medications   cholecalciferol 25 MCG (1000 UT) tablet Commonly known as: VITAMIN  D3 Take 2,000 Units by mouth daily.   [facility] VITAMIN B12 1000 MCG tablet Generic drug: cyanocobalamin Take 2,000 mcg by mouth daily.   ergocalciferol 1.25 MG (50000 UT) capsule Commonly known as: Drisdol Take 1 capsule (50,000 Units total) by mouth once a week.   folic acid 1 MG tablet Commonly known as: FOLVITE Take 1 tablet (1 mg total) daily by mouth.   hydroxyurea 500 MG capsule Commonly known as: HYDREA TAKE 3 CAPSULES BY MOUTH DAILY What changed:   how much to take  how to take this  when to take this  additional instructions   nortriptyline 25 MG capsule Commonly known as: PAMELOR Take 1 capsule (25 mg total) by mouth at bedtime as needed for sleep.   Oxycodone HCl 10 MG Tabs Take 1 tablet (10 mg total) by mouth every 6 (six) hours as needed. What changed:   reasons to take this  Another medication with the same name was removed. Continue taking this medication, and follow the directions you see here.   predniSONE 20 MG tablet Commonly known as: DELTASONE Take 20 mg by mouth 3 (three) times daily.  The results of significant diagnostics from this hospitalization (including imaging, microbiology, ancillary and laboratory) are listed below for reference.    Significant Diagnostic Studies: Dg Chest 2 View  Result Date: 07/22/2018 CLINICAL DATA:  Sickle cell crisis.  Pain EXAM: CHEST - 2 VIEW COMPARISON:  July 21, 2018 FINDINGS: There is atelectatic change in the left base. The lungs elsewhere are clear. Heart size and pulmonary vascularity are normal. No adenopathy. There is a vascular necrosis in the right humeral head. Several thoracic and lumbar spine endplate infarcts are again noted. IMPRESSION: New left base atelectasis. Lungs elsewhere clear. Stable cardiac silhouette. No adenopathy. Bony changes indicative of sickle cell disease as noted. Electronically Signed   By: Lowella Grip III M.D.   On: 07/22/2018 14:18   Dg Foot Complete Left  Result  Date: 08/20/2018 CLINICAL DATA:  Left foot pain, denies injury EXAM: LEFT FOOT - COMPLETE 3+ VIEW COMPARISON:  12/03/2015 FINDINGS: No acute fracture or dislocation of the left foot. There is sclerosis and severe distal arthritic change of the left first metatarsal at the metatarsophalangeal joint; the first proximal phalanx is intact. 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 Joint spaces are otherwise well preserved. IMPRESSION: No acute fracture or dislocation of the left foot. There is sclerosis and severe distal arthritic change of the left first metatarsal at the metatarsophalangeal joint; the first proximal phalanx is intact. 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 Joint spaces are otherwise well preserved. Electronically Signed   By: Eddie Candle M.D.   On: 08/20/2018 13:24    Microbiology: Recent Results (from the past 240 hour(s))  Novel Coronavirus,NAA,(SEND-OUT TO REF LAB - TAT 24-48 hrs); Hosp Order     Status: None   Collection Time: 08/19/18  1:39 AM   Specimen: Nasopharyngeal Swab; Respiratory  Result Value Ref Range Status   SARS-CoV-2, NAA NOT DETECTED NOT DETECTED Final    Comment: (NOTE) This test was developed and its performance characteristics determined by [employer]. This test has not been FDA cleared or approved. This test has been authorized by FDA under an Emergency Use Authorization (EUA). This test is only authorized for the duration of time the declaration that circumstances exist justifying the authorization of the emergency use of in vitro diagnostic tests for detection of SARS-CoV-2 virus and/or diagnosis of COVID-19 infection under section 564(b)(1) of the Act, 21  U.S.C. 546TKP-5(W)(6), unless the authorization is terminated or revoked sooner. When diagnostic testing is negative, the possibility of a false negative result should be considered in the context of a patient's recent exposures and the presence of clinical signs and symptoms consistent with COVID-19. An individual without symptoms of COVID-19 and who is not shedding SARS-CoV-2 virus would expect to have a negative (not detected) result in this assay. Performed  At: [facility] [place], [place] [id number] Rush Farmer MD GY:[id number]    [place]  Final    Comment: Performed at [facility] [address] [place], [place] [address]     Labs: Basic Metabolic Panel: Recent Labs  Lab 08/19/18 0048 08/19/18 0643 08/20/18 1020  NA 142 143 139  K 3.8 4.9 4.6  CL 110 115* 111  CO2 22 19* 21*  GLUCOSE 130* 121* 149*  BUN 43* 41* 39*  CREATININE 2.39* 2.23*  2.31*  CALCIUM 8.2* 8.6* 8.8*  MG 2.3  --   --    Liver Function Tests: Recent Labs  Lab 08/19/18 0048 08/19/18 0643  AST 33 34  ALT 63* 63*  ALKPHOS 207* 215*  BILITOT 0.8 1.2  PROT 5.3* 5.4*  ALBUMIN 2.6* 2.7*   No results for input(s): LIPASE, AMYLASE in the last 168 hours. No results for input(s): AMMONIA in the last 168 hours. CBC: Recent Labs  Lab 08/19/18 0048 08/19/18 0904 08/20/18 1020  WBC 8.4 10.3 9.5  NEUTROABS 6.5  --   --   HGB 5.4* 7.6* 7.5*  HCT 16.6* 21.9* 22.2*  MCV 103.8* 96.1 98.2  PLT 151 PLATELET CLUMPS NOTED ON SMEAR, UNABLE TO ESTIMATE PLATELET CLUMPS NOTED ON SMEAR, UNABLE TO ESTIMATE   Cardiac Enzymes: No results for input(s): CKTOTAL, CKMB, CKMBINDEX, TROPONINI in the last 168 hours. BNP: Invalid input(s): POCBNP CBG: No results for input(s): GLUCAP in the last 168 hours.  Time coordinating discharge: 50 minutes  Signed:  [name]  APRN, MSN, FNP-C Patient [facility] [address] [place], [place] [address] [phone]  [facility] 08/21/2018, 12:22 PM

## 2018-08-21 NOTE — Progress Notes (Signed)
[name] [name]. to be D/C'd Home per MD order.  Discussed prescriptions and follow up appointments with the patient. Prescriptions given to patient, medication list explained in detail. Pt verbalized understanding.  Allergies as of 08/21/2018      Reactions   Nsaids Other (See Comments)   Kidney disease   Morphine And Related Other (See Comments)   "real bad headaches"      Medication List    TAKE these medications   cholecalciferol 25 MCG (1000 UT) tablet Commonly known as: VITAMIN D3 Take 2,000 Units by mouth daily.   [facility]S VITAMIN B12 1000 MCG tablet Generic drug: cyanocobalamin Take 2,000 mcg by mouth daily.   ergocalciferol 1.25 MG (50000 UT) capsule Commonly known as: Drisdol Take 1 capsule (50,000 Units total) by mouth once a week.   folic acid 1 MG tablet Commonly known as: FOLVITE Take 1 tablet (1 mg total) daily by mouth.   hydroxyurea 500 MG capsule Commonly known as: HYDREA TAKE 3 CAPSULES BY MOUTH DAILY What changed:   how much to take  how to take this  when to take this  additional instructions   nortriptyline 25 MG capsule Commonly known as: PAMELOR Take 1 capsule (25 mg total) by mouth at bedtime as needed for sleep.   Oxycodone HCl 10 MG Tabs Take 1 tablet (10 mg total) by mouth every 6 (six) hours as needed. What changed:   reasons to take this  Another medication with the same name was removed. Continue taking this medication, and follow the directions you see here.   predniSONE 20 MG tablet Commonly known as: DELTASONE Take 20 mg by mouth 3 (three) times daily.       Vitals:   08/21/18 0825 08/21/18 1200  BP: (!) 141/101 (!) 142/88  Pulse: 84 65  Resp: 16 18  Temp: 98.3 F (36.8 C) 98.5 F (36.9 C)  SpO2: 100% 100%    Skin clean, dry and intact without evidence of skin break down, no evidence of skin tears noted. IV catheter discontinued intact. Site without signs and symptoms of complications. Dressing and pressure  applied. Pt denies pain at this time. No complaints noted.  An After Visit Summary was printed and given to the patient. Patient escorted via [place], and D/C home via private auto.  [name] [name] S 08/21/2018 1:21 PM

## 2018-08-23 LAB — TYPE AND SCREEN
ABO/RH(D): O POS
Antibody Screen: NEGATIVE
Donor AG Type: NEGATIVE
Donor AG Type: NEGATIVE
Unit division: 0
Unit division: 0

## 2018-08-23 LAB — BPAM RBC
Blood Product Expiration Date: 202007182359
Blood Product Expiration Date: 202007242359
ISSUE DATE / TIME: 202007060335
Unit Type and Rh: 5100
Unit Type and Rh: 9500

## 2018-08-27 ENCOUNTER — Other Ambulatory Visit: Payer: Self-pay

## 2018-08-27 ENCOUNTER — Ambulatory Visit (INDEPENDENT_AMBULATORY_CARE_PROVIDER_SITE_OTHER): Payer: Medicare Other | Admitting: Family Medicine

## 2018-08-27 ENCOUNTER — Encounter: Payer: Self-pay | Admitting: Family Medicine

## 2018-08-27 VITALS — BP 123/88 | HR 108 | Temp 98.9°F | Resp 16 | Ht 63.0 in | Wt 127.0 lb

## 2018-08-27 DIAGNOSIS — D571 Sickle-cell disease without crisis: Secondary | ICD-10-CM | POA: Diagnosis not present

## 2018-08-27 DIAGNOSIS — D5700 Hb-SS disease with crisis, unspecified: Secondary | ICD-10-CM | POA: Diagnosis not present

## 2018-08-27 DIAGNOSIS — N08 Glomerular disorders in diseases classified elsewhere: Secondary | ICD-10-CM | POA: Diagnosis not present

## 2018-08-27 DIAGNOSIS — M79672 Pain in left foot: Secondary | ICD-10-CM | POA: Diagnosis not present

## 2018-08-27 DIAGNOSIS — R8290 Unspecified abnormal findings in urine: Secondary | ICD-10-CM | POA: Diagnosis not present

## 2018-08-27 DIAGNOSIS — D5709 Hb-ss disease with crisis with other specified complication: Secondary | ICD-10-CM

## 2018-08-27 LAB — POCT URINALYSIS DIPSTICK
Bilirubin, UA: NEGATIVE
Glucose, UA: POSITIVE — AB
Ketones, UA: NEGATIVE
Nitrite, UA: NEGATIVE
Protein, UA: POSITIVE — AB
Spec Grav, UA: 1.02 (ref 1.010–1.025)
Urobilinogen, UA: 0.2 E.U./dL
pH, UA: 7.5 (ref 5.0–8.0)

## 2018-08-27 NOTE — Progress Notes (Signed)
Established Patient Office Visit  Subjective:  Patient ID: [name] Meyer., male    DOB: Jun 06, 1980  Age: 38 y.o. MRN: [id number]  CC:  Chief Complaint  Patient presents with  . Sickle Cell Anemia  . Medication Refill    oxycodone 15mg      HPI [name] Dershem., a 38 year old male with a medical history significant for sickle cell disease, type SS, chronic pain syndrome, opiate dependence, history of polysubstance abuse, history of sickle cell nephropathy, CKD stage III, and anemia of chronic disease presents for post hospital follow-up and left foot pain.  Patient was recently hospitalized on 08/18/2018 for sickle cell pain crisis.  Pain was primarily to left foot and left lower extremity.  Left foot pain worsened by weightbearing.  Pain improved prior to hospital discharge.  However, patient continues to have increased pain with prolonged standing and weightbearing primarily to left foot.  Patient underwent left foot x-ray while hospitalized.  X-ray showed no acute fracture or dislocation of the left foot.  There was sclerosis and severe distal arthritic changes of the left first metatarsal at the metatarsophalangeal joint; the first proximal phalanx was intact.  The findings were substantially progressed in comparison to radiographs that were dated 12/03/2015 and suggest posttraumatic arthrosis or alternatively advanced sequelae of a vascular necrosis.  His joint spaces were otherwise preserved.  Current pain intensity is 6/10.  Patient has been taking oxycodone 15 mg consistently with moderate relief.  He has been out of OxyContin.  According to PDMP, patient can have refill on OxyContin on 08/29/2018.  Patient has a history of polysubstance abuse.  Most recent drug screen was negative for cocaine.  Patient is monitored periodically.  He denies any recent illicit drug use. Patient has a history of sickle cell nephropathy.  He is followed by nephrology monthly.  Patient has continued  prednisone without interruption.  Patient denies urinary frequency, urgency, dysuria, urinary retention, or low back pain. He also denies headache, dizziness, shortness of breath, dysuria, abdominal pain, nausea, vomiting, or diarrhea. Patient denies sick contacts, recent travel, or exposure to COVID-19.  Past Medical History:  Diagnosis Date  . Arachnoid Cyst of brain bilaterally    "2 really small ones in the back of my head; inside; saw them w/MRI" (09/25/2012)  . Bacterial pneumonia ~ 2012   "caught it here in the hospital" (09/25/2012)  . Chronic kidney disease    "from my sickle cell" (09/25/2012)  . CKD (chronic kidney disease), stage II   . GERD (gastroesophageal reflux disease)    "after I eat alot of spicey foods" (09/25/2012)  . Gynecomastia, male 07/10/2012  . History of blood transfusion    "always related to sickle cell crisis" (09/25/2012)  . 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   Noted in noted from Hematology notes at [facility]  . Migraines    "take RX qd to prevent them" (09/25/2012)  . Opioid dependence with withdrawal ([place]) 08/30/2012  . Sickle cell anemia (H[facility])   . Sickle cell crisis ([place]) 09/25/2012  . Sickle cell nephropathy ([place]) 07/10/2012  . Sinus tachycardia   . Tachycardia with heart rate 121-140 beats per minute with ambulation 08/04/2016    Past Surgical History:  Procedure Laterality Date  . CHOLECYSTECTOMY  ~ 2012  . COLONOSCOPY N/A 04/23/2017   Procedure: COLONOSCOPY;  Surgeon: Irene Shipper, MD;  Location: [facility];  Service: Endoscopy;  Laterality: N/A;  . IR FLUORO GUIDE CV LINE RIGHT  12/17/2016  . IR  REMOVAL TUN CV CATH W/O FL  12/21/2016  . IR US GUIDE VASC ACCESS RIGHT  12/17/2016  . spleenectomy      Family History  Problem Relation Age of Onset  . Breast cancer Mother     Social History   Socioeconomic History  . Marital status: Single    Spouse name: Not on file  . Number of children: Not on file  . Years of education:  Not on file  . Highest education level: Not on file  Occupational History  . Not on file  Social Needs  . Financial resource strain: Not on file  . Food insecurity    Worry: Not on file    Inability: Not on file  . Transportation needs    Medical: Not on file    Non-medical: Not on file  Tobacco Use  . Smoking status: Current Some Day Smoker    Packs/day: 0.10    Types: Cigarettes  . Smokeless tobacco: Never Used  . Tobacco comment: 09/25/2012 "I don't buy cigarettes; bum one from friends q now and then"  Substance and Sexual Activity  . Alcohol use: Yes    Alcohol/week: 0.0 standard drinks    Frequency: Never    Comment: Once a month  . Drug use: Not Currently    Types: Marijuana    Comment: Every 2-3 weeks   . Sexual activity: Yes  Lifestyle  . Physical activity    Days per week: Not on file    Minutes per session: Not on file  . Stress: Not on file  Relationships  . Social Herbalist on phone: Not on file    Gets together: Not on file    Attends religious service: Not on file    Active member of club or organization: Not on file    Attends meetings of clubs or organizations: Not on file    Relationship status: Not on file  . Intimate partner violence    Fear of current or ex partner: Not on file    Emotionally abused: Not on file    Physically abused: Not on file    Forced sexual activity: Not on file  Other Topics Concern  . Not on file  Social History Narrative    Lives alone in [place].   Back at school, studying Public relations account executive. Unemployed.    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     Outpatient Medications Prior to Visit  Medication Sig Dispense Refill  . cholecalciferol (VITAMIN D3) 25 MCG (1000 UT) tablet Take 2,000 Units by mouth daily.    . CVS VITAMIN B12 1000 MCG tablet Take 2,000 mcg by  mouth daily.    . folic acid (FOLVITE) 1 MG tablet Take 1 tablet (1 mg total) daily by mouth. 30 tablet 2  . hydroxyurea (HYDREA) 500 MG capsule TAKE 3 CAPSULES BY MOUTH DAILY (Patient taking differently: Take 1,500 mg by mouth daily. ) 270 capsule 1  . nortriptyline (PAMELOR) 25 MG capsule Take 1 capsule (25 mg total) by mouth at bedtime as needed for sleep. 90 capsule 3  . Oxycodone HCl 10 MG TABS Take 1 tablet (10 mg total) by mouth every 6 (six) hours as needed. 30 tablet 0  . predniSONE (DELTASONE) 20 MG tablet Take 20 mg by mouth 3 (three) times daily.     Marland Kitchen  ergocalciferol (DRISDOL) 1.25 MG (50000 UT) capsule Take 1 capsule (50,000 Units total) by mouth once a week. (Patient not taking: Reported on 08/18/2018) 30 capsule 1   No facility-administered medications prior to visit.     Allergies  Allergen Reactions  . Nsaids Other (See Comments)    Kidney disease  . Morphine And Related Other (See Comments)    "real bad headaches"    ROS Review of Systems    Objective:    Physical Exam  BP 123/88 (BP Location: Right Arm, Patient Position: Sitting, Cuff Size: Normal)   Pulse (!) 108   Temp 98.9 F (37.2 C) (Oral)   Resp 16   Ht 5\' 3"  (1.6 m)   Wt 127 lb (57.6 kg)   SpO2 100%   BMI 22.50 kg/m  Wt Readings from Last 3 Encounters:  08/27/18 127 lb (57.6 kg)  08/19/18 131 lb (59.4 kg)  08/09/18 131 lb (59.4 kg)     There are no preventive care reminders to display for this patient.  There are no preventive care reminders to display for this patient.  Lab Results  Component Value Date   TSH 2.860 05/11/2017   Lab Results  Component Value Date   WBC 10.1 08/21/2018   HGB 8.3 (L) 08/21/2018   HCT 25.8 (L) 08/21/2018   MCV 104.9 (H) 08/21/2018   PLT 151 08/21/2018   Lab Results  Component Value Date   NA 135 08/21/2018   K 4.2 08/21/2018   CO2 18 (L) 08/21/2018   GLUCOSE 128 (H) 08/21/2018   BUN 37 (H) 08/21/2018   CREATININE 2.21 (H) 08/21/2018   BILITOT 1.2  08/19/2018   ALKPHOS 215 (H) 08/19/2018   AST 34 08/19/2018   ALT 63 (H) 08/19/2018   PROT 5.4 (L) 08/19/2018   ALBUMIN 2.7 (L) 08/19/2018   CALCIUM 8.9 08/21/2018   ANIONGAP 8 08/21/2018   Lab Results  Component Value Date   CHOL 145 03/07/2018   Lab Results  Component Value Date   HDL 65 03/07/2018   Lab Results  Component Value Date   LDLCALC 30 03/07/2018   Lab Results  Component Value Date   TRIG 251 (H) 03/07/2018   Lab Results  Component Value Date   CHOLHDL 2.2 03/07/2018   Lab Results  Component Value Date   HGBA1C <4.2 (L) 03/07/2018      Assessment & Plan:   Problem List Items Addressed This Visit      Cardiovascular and Mediastinum   Sickle cell nephropathy ([place]) - Primary (Chronic)   Relevant Orders   Urinalysis Dipstick    Other Visit Diagnoses    Hb-SS disease without crisis ([place])       Relevant Orders   CBC with Differential   Comprehensive metabolic panel   Left foot pain       Relevant Orders   AMB referral to orthopedics      No orders of the defined types were placed in this encounter. 1. Sickle cell nephropathy with crisis [facility]) Patient has a history of sickle cell nephropathy. He is followed by [facility]. Prior to hospital discharge, creatinine was 2.2 and GFR 43, which was consistent with patient's baseline. Patient will continue to follow up with nephrology.  Also, continue prednisone as prescribed.  Recommend continuing renal diet as discussed - Urinalysis Dipstick  2. 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 Patient's  baseline is 7.8. Repeat CBC with differential.  Continue Hydrea. We discussed the need for good hydration, monitoring of hydration status, avoidance of heat, cold, stress, and infection triggers. We discussed the risks and benefits of Hydrea, including bone marrow suppression, the possibility of  GI upset, skin ulcers, hair thinning, and teratogenicity. The patient was reminded of the need to seek medical attention of any symptoms of bleeding, anemia, or infection. Continue folic acid 1 mg daily to prevent aplastic bone marrow crises.   Pulmonary evaluation - Patient denies severe recurrent wheezes, shortness of breath with exercise, or persistent cough. If these symptoms develop, pulmonary function tests with spirometry will be ordered, and if abnormal, plan on referral to Pulmonology for further evaluation.  Cardiac - Routine screening for pulmonary hypertension is not recommended.  Eye - High risk of proliferative retinopathy. Annual eye exam with retinal exam recommended to patient.  Acute and chronic painful episodes - Discussed pain medication regimen at length. Will review PDMP prior to prescribing pain medications.   - CBC with Differential - Comprehensive metabolic panel  3. Left foot pain Patient continues to have left foot pain that is worsened with weight bearing. Patient warrants referral to orthopedic services.  - AMB referral to orthopedics  4. Abnormal urinalysis - Urine Culture  Follow-up: Return in about 1 month (around 09/27/2018) for sickle cell anemia.    [name]  APRN, MSN, FNP-C [facility] [address] [place], [place] [address] [phone]

## 2018-08-27 NOTE — Patient Instructions (Signed)
Will follow up by phone with your lab results.    Discussed the importance of drinking 64 ounces of water daily. The Importance of Water. To help prevent pain crises, it is important to drink plenty of water throughout the day. This is because dehydration of red blood cells may lead to the sickling process.       Sickle Cell Anemia, Adult  Sickle cell anemia is a condition where your red blood cells are shaped like sickles. Red blood cells carry oxygen through the body. Sickle-shaped cells do not live as long as normal red blood cells. They also clump together and block blood from flowing through the blood vessels. This prevents the body from getting enough oxygen. Sickle cell anemia causes organ damage and pain. It also increases the risk of infection. Follow these instructions at home: Medicines  Take over-the-counter and prescription medicines only as told by your doctor.  If you were prescribed an antibiotic medicine, take it as told by your doctor. Do not stop taking the antibiotic even if you start to feel better.  If you develop a fever, do not take medicines to lower the fever right away. Tell your doctor about the fever. Managing pain, stiffness, and swelling  Try these methods to help with pain: ? Use a heating pad. ? Take a warm bath. ? Distract yourself, such as by watching TV. Eating and drinking  Drink enough fluid to keep your pee (urine) clear or pale yellow. Drink more in hot weather and during exercise.  Limit or avoid alcohol.  Eat a healthy diet. Eat plenty of fruits, vegetables, whole grains, and lean protein.  Take vitamins and supplements as told by your doctor. Traveling  When traveling, keep these with you: ? Your medical information. ? The names of your doctors. ? Your medicines.  If you need to take an airplane, talk to your doctor first. Activity  Rest often.  Avoid exercises that make your heart beat much faster, such as jogging. General  instructions  Do not use products that have nicotine or tobacco, such as cigarettes and e-cigarettes. If you need help quitting, ask your doctor.  Consider wearing a medical alert bracelet.  Avoid being in high places (high altitudes), such as mountains.  Avoid very hot or cold temperatures.  Avoid places where the temperature changes a lot.  Keep all follow-up visits as told by your doctor. This is important. Contact a doctor if:  A joint hurts.  Your feet or hands hurt or swell.  You feel tired (fatigued). Get help right away if:  You have symptoms of infection. These include: ? Fever. ? Chills. ? Being very tired. ? Irritability. ? Poor eating. ? Throwing up (vomiting).  You feel dizzy or faint.  You have new stomach pain, especially on the left side.  You have a an erection (priapism) that lasts more than 4 hours.  You have numbness in your arms or legs.  You have a hard time moving your arms or legs.  You have trouble talking.  You have pain that does not go away when you take medicine.  You are short of breath.  You are breathing fast.  You have a long-term cough.  You have pain in your chest.  You have a bad headache.  You have a stiff neck.  Your stomach looks bloated even though you did not eat much.  Your skin is pale.  You suddenly cannot see well. Summary  Sickle cell anemia is a  condition where your red blood cells are shaped like sickles.  Follow your doctor's advice on ways to manage pain, food to eat, activities to do, and steps to take for safe travel.  Get medical help right away if you have any signs of infection, such as a fever. This information is not intended to replace advice given to you by your health care provider. Make sure you discuss any questions you have with your health care provider. Document Released: 11/20/2012 Document Revised: 05/24/2018 Document Reviewed: 03/07/2016 Elsevier Patient Education  2020 [employer].

## 2018-08-28 LAB — CBC WITH DIFFERENTIAL/PLATELET
Basophils Absolute: 0 10*3/uL (ref 0.0–0.2)
Basos: 0 %
EOS (ABSOLUTE): 0 10*3/uL (ref 0.0–0.4)
Eos: 0 %
Hematocrit: 24.2 % — ABNORMAL LOW (ref 37.5–51.0)
Hemoglobin: 8.2 g/dL — ABNORMAL LOW (ref 13.0–17.7)
Immature Grans (Abs): 0.2 10*3/uL — ABNORMAL HIGH (ref 0.0–0.1)
Immature Granulocytes: 2 %
Lymphocytes Absolute: 1.2 10*3/uL (ref 0.7–3.1)
Lymphs: 11 %
MCH: 33.2 pg — ABNORMAL HIGH (ref 26.6–33.0)
MCHC: 33.9 g/dL (ref 31.5–35.7)
MCV: 98 fL — ABNORMAL HIGH (ref 79–97)
Monocytes Absolute: 0.6 10*3/uL (ref 0.1–0.9)
Monocytes: 6 %
NRBC: 20 % — ABNORMAL HIGH (ref 0–0)
Neutrophils Absolute: 8.6 10*3/uL — ABNORMAL HIGH (ref 1.4–7.0)
Neutrophils: 81 %
Platelets: 238 10*3/uL (ref 150–450)
RBC: 2.47 x10E6/uL — CL (ref 4.14–5.80)
RDW: 22.8 % — ABNORMAL HIGH (ref 11.6–15.4)
WBC: 10.6 10*3/uL (ref 3.4–10.8)

## 2018-08-28 LAB — COMPREHENSIVE METABOLIC PANEL
ALT: 45 IU/L — ABNORMAL HIGH (ref 0–44)
AST: 34 IU/L (ref 0–40)
Albumin/Globulin Ratio: 1.5 (ref 1.2–2.2)
Albumin: 3 g/dL — ABNORMAL LOW (ref 4.0–5.0)
Alkaline Phosphatase: 184 IU/L — ABNORMAL HIGH (ref 39–117)
BUN/Creatinine Ratio: 18 (ref 9–20)
BUN: 43 mg/dL — ABNORMAL HIGH (ref 6–20)
Bilirubin Total: 0.8 mg/dL (ref 0.0–1.2)
CO2: 16 mmol/L — ABNORMAL LOW (ref 20–29)
Calcium: 8 mg/dL — ABNORMAL LOW (ref 8.7–10.2)
Chloride: 108 mmol/L — ABNORMAL HIGH (ref 96–106)
Creatinine, Ser: 2.42 mg/dL — ABNORMAL HIGH (ref 0.76–1.27)
GFR calc Af Amer: 39 mL/min/{1.73_m2} — ABNORMAL LOW (ref >59)
GFR calc non Af Amer: 33 mL/min/{1.73_m2} — ABNORMAL LOW (ref >59)
Globulin, Total: 2 g/dL (ref 1.5–4.5)
Glucose: 93 mg/dL (ref 65–99)
Potassium: 4.8 mmol/L (ref 3.5–5.2)
Sodium: 139 mmol/L (ref 134–144)
Total Protein: 5 g/dL — ABNORMAL LOW (ref 6.0–8.5)

## 2018-08-29 ENCOUNTER — Telehealth: Payer: Self-pay

## 2018-08-29 LAB — URINE CULTURE: Organism ID, Bacteria: NO GROWTH

## 2018-08-30 ENCOUNTER — Telehealth: Payer: Self-pay

## 2018-08-30 ENCOUNTER — Other Ambulatory Visit: Payer: Self-pay | Admitting: Family Medicine

## 2018-08-30 DIAGNOSIS — D571 Sickle-cell disease without crisis: Secondary | ICD-10-CM

## 2018-08-30 DIAGNOSIS — G894 Chronic pain syndrome: Secondary | ICD-10-CM

## 2018-08-30 MED ORDER — OXYCODONE HCL ER 15 MG PO T12A: 15.0000 mg | EXTENDED_RELEASE_TABLET | Freq: Two times a day (BID) | ORAL | 0 refills | Status: DC

## 2018-08-30 MED ORDER — OXYCODONE HCL 10 MG PO TABS: 10.0000 mg | ORAL_TABLET | Freq: Four times a day (QID) | ORAL | 0 refills | Status: DC | PRN

## 2018-08-30 NOTE — Progress Notes (Signed)
Reviewed PDMP prior to prescribing opiate medications. No inconsistencies noted. Daily MME 86.5.   Meds ordered this encounter  Medications  . oxyCODONE (OXYCONTIN) 15 mg 12 hr tablet    Sig: Take 1 tablet (15 mg total) by mouth every 12 (twelve) hours.    Dispense:  60 tablet    Refill:  0    Order Specific Question:   Supervising Provider    Answer:   [name] [id number]  . Oxycodone HCl 10 MG TABS    Sig: Take 1 tablet (10 mg total) by mouth every 6 (six) hours as needed.    Dispense:  30 tablet    Refill:  0    Order Specific Question:   Supervising Provider    Answer:   [name] [id number]    Patient to follow up with PCP in 1 month for medication management.   [name]  APRN, MSN, FNP-C [facility] [address] [place], [place] [address] [phone]

## 2018-08-30 NOTE — Telephone Encounter (Signed)
-----   Message from [name], [place] sent at 08/30/2018  7:08 AM EDT ----- Please inform patient that all labs are consistent with his baseline. Hemoglobin is 8.2. There is no clinical indication to repeat blood transfusion at this time. Will repeat CBC at 1 month follow up. Also, follow up with nephrology as scheduled. Continue prednisone as prescribed. Prescriptions have been sent to pharmacy.    [name]  APRN, MSN, FNP-C [facility] [address] [place], [place] [address] [phone]

## 2018-08-30 NOTE — Telephone Encounter (Signed)
Called and spoke with patient, advised that all labs are consistent with baseline. Advised that hemoglobin is 8.2 and no clinical indication to repeat blood transfusion at this time. Advised we will repeat CBC at 1 month follow up. Asked that he keep appointment with Nephrology as scheduled and to continue prednisone. Patient verbalized understanding. Thanks!

## 2018-09-02 NOTE — Telephone Encounter (Signed)
Message sent to provider 

## 2018-09-04 DIAGNOSIS — N183 Chronic kidney disease, stage 3 (moderate): Secondary | ICD-10-CM | POA: Diagnosis not present

## 2018-09-12 IMAGING — CT CT RENAL STONE PROTOCOL
2 of 4 series · 16 of 46 positions shown, 18 images · non-contrast
Comparison: None.

CLINICAL DATA: Right-sided flank pain beginning today. Sickle cell
disease.

EXAM:
CT ABDOMEN AND PELVIS WITHOUT CONTRAST
TECHNIQUE: Multidetector CT imaging of the abdomen and pelvis was performed
following the standard protocol without IV contrast.

[Series 2: axial st · axial · 0.70mm/px · z∈[+1126,+1526]mm · 13 of 90 slices shown, 15 images]
[im 5/90  soft-tissue]
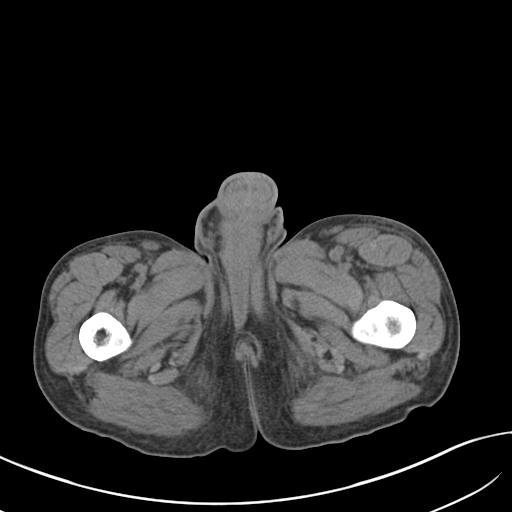
[im 5/90  bone]
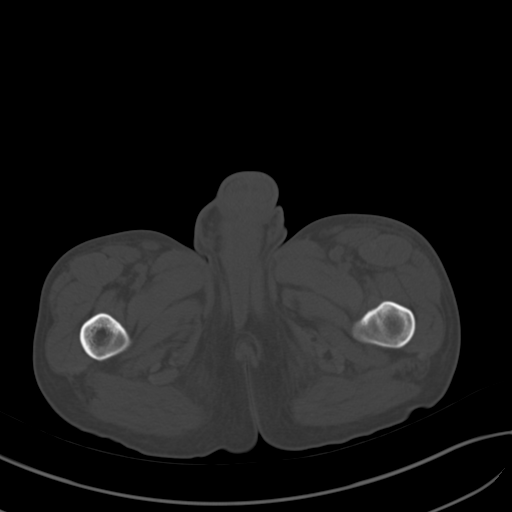
[im 10/90  soft-tissue]
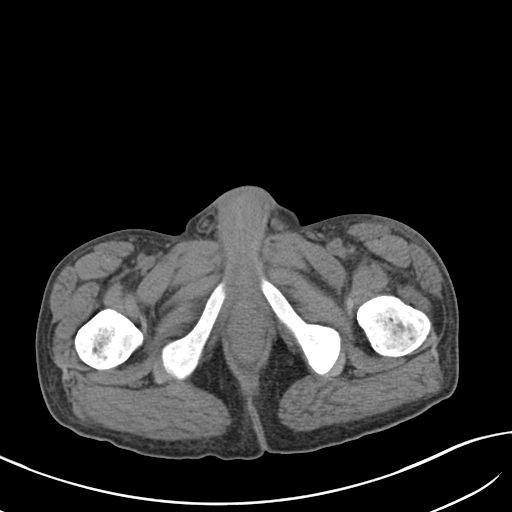
[im 20/90  soft-tissue]
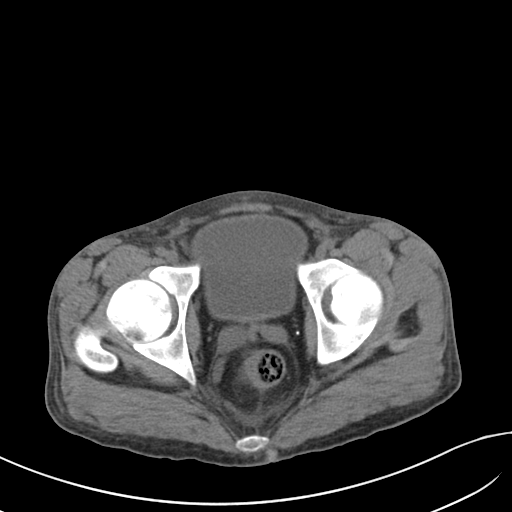
[im 25/90  soft-tissue]
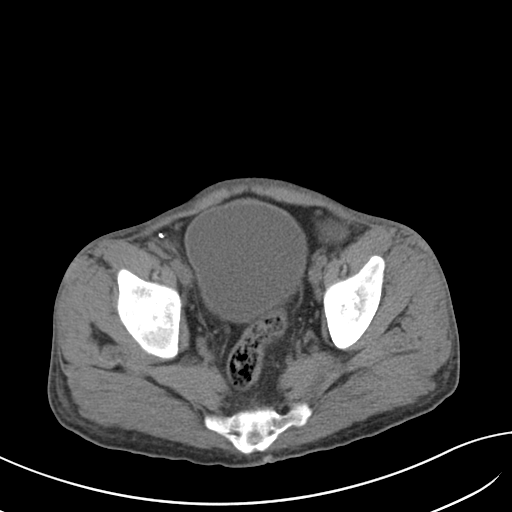
[im 30/90  soft-tissue]
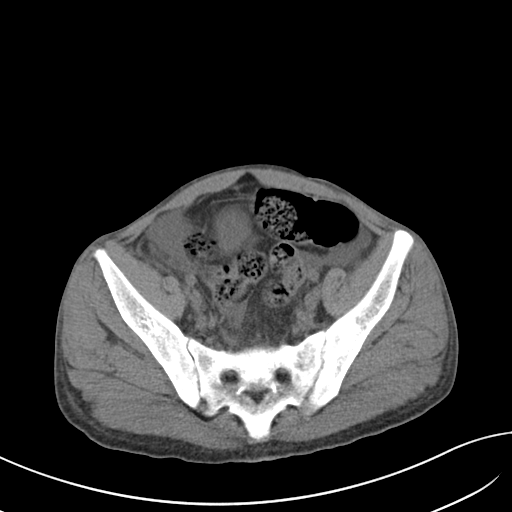
[im 40/90  soft-tissue]
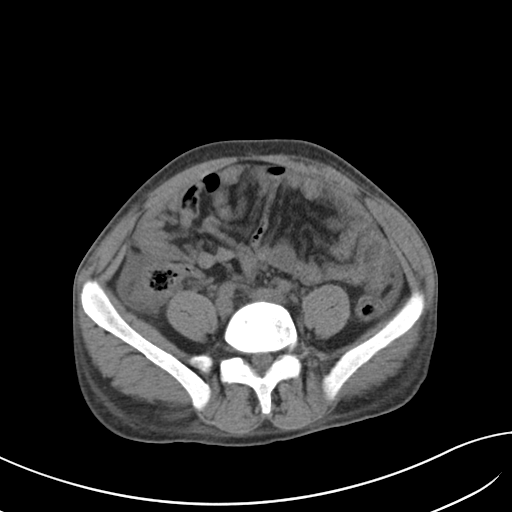
[im 45/90  soft-tissue]
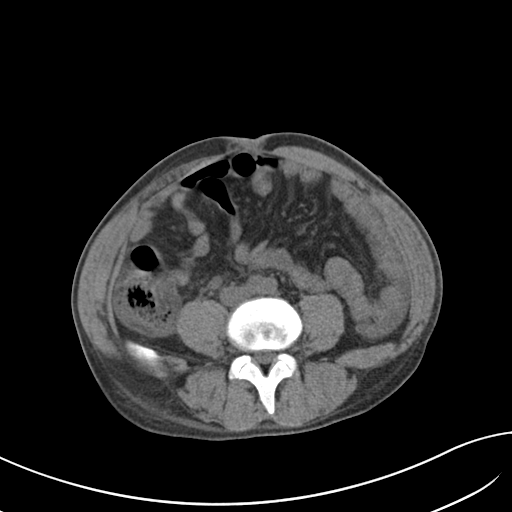
[im 50/90  soft-tissue]
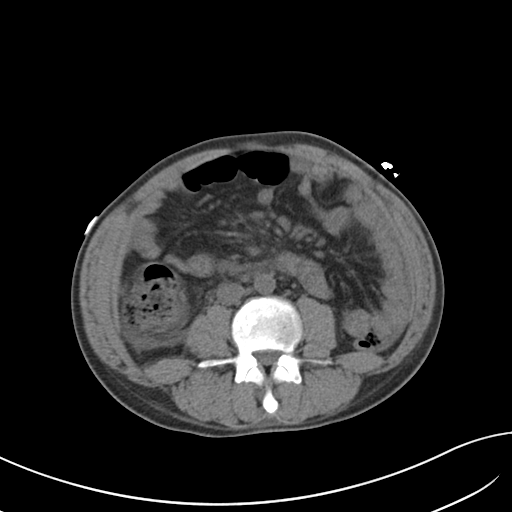
[im 60/90  soft-tissue]
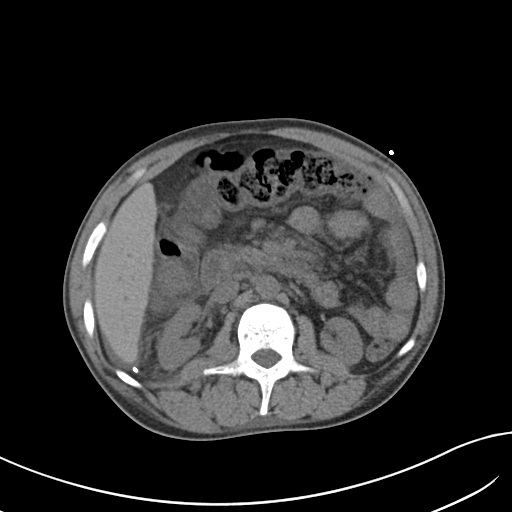
[im 60/90  bone]
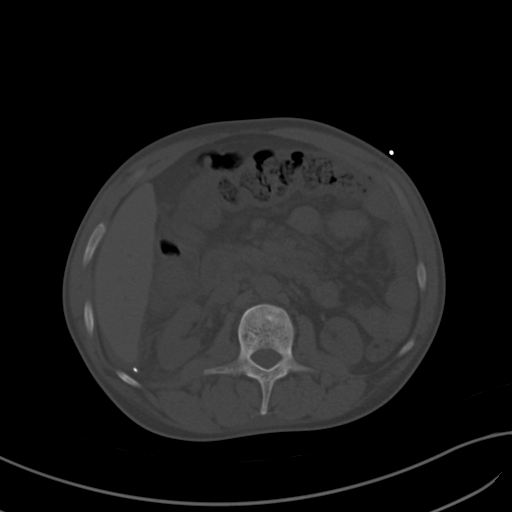
[im 65/90  soft-tissue]
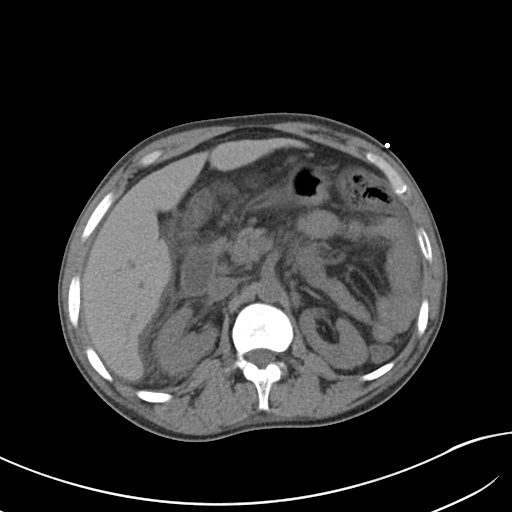
[im 70/90  soft-tissue]
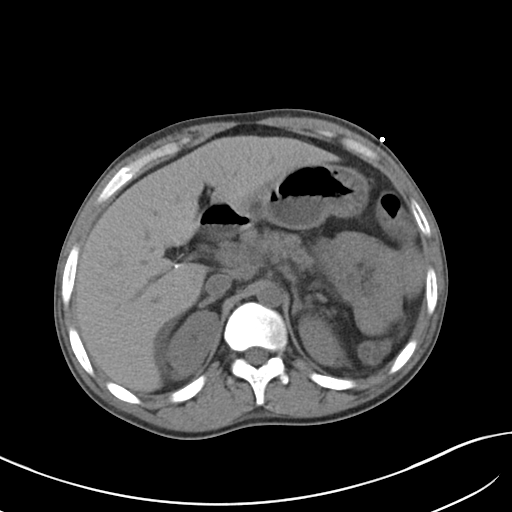
[im 80/90  soft-tissue]
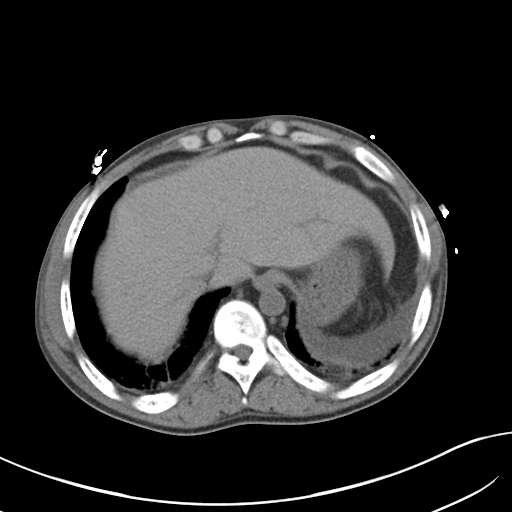
[im 85/90  soft-tissue]
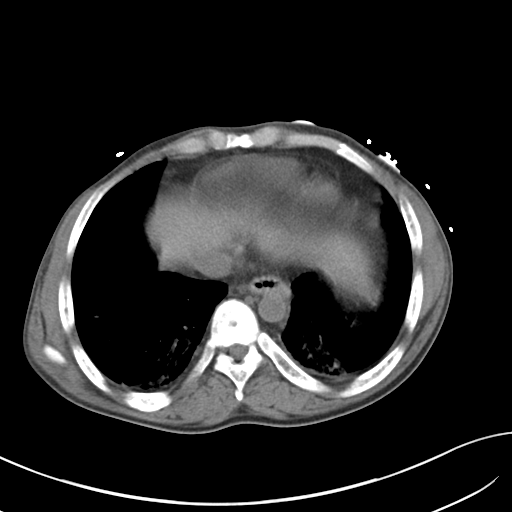

[Series 5: coronal · coronal · 0.61mm/px · 3 of 127 slices shown]
[im 43/127  soft-tissue]
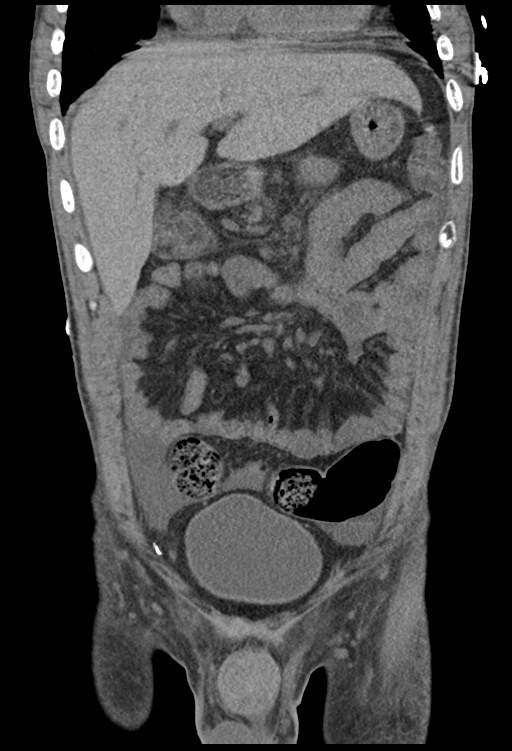
[im 57/127  soft-tissue]
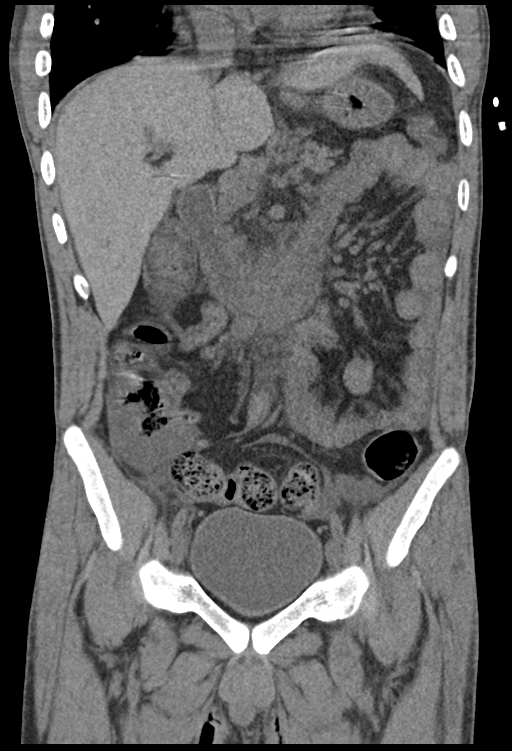
[im 71/127  soft-tissue]
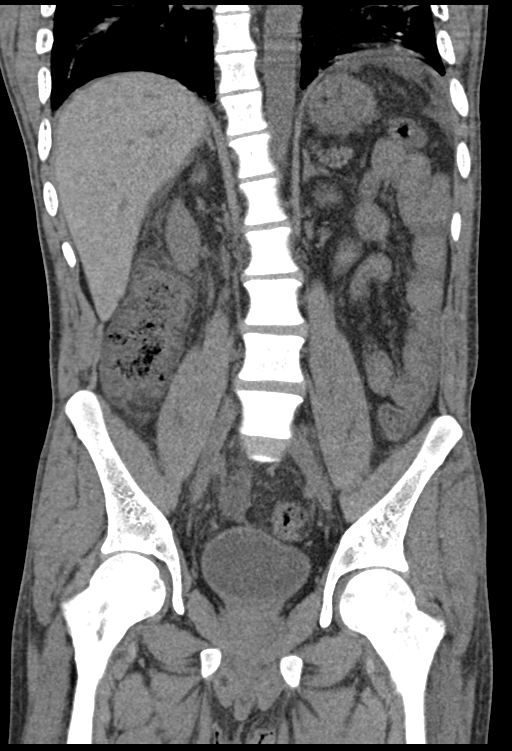

[16 of 46 positions shown; findings below may reference images not displayed]

FINDINGS: Lower chest: Bibasilar scarring. Asymmetric opacity in left lung
base may be due to atelectasis or infiltrate.

Hepatobiliary: No mass visualized on this unenhanced exam. Prior
cholecystectomy. No evidence of biliary obstruction.

Pancreas: No mass or inflammatory process visualized on this
unenhanced exam.

Spleen: Tiny calcified spleen, consistent with auto splenectomy of
sickle cell disease.

Adrenals/Urinary tract: Bilateral renal parenchymal atrophy. No
evidence of urolithiasis or hydronephrosis. Unremarkable unopacified
urinary bladder.

Stomach/Bowel: Mild to moderate wall thickening is seen involving
the ascending and transverse colon, consistent with colitis. No
evidence of bowel obstruction or abscess. Mild ascites noted.

Vascular/Lymphatic: No pathologically enlarged lymph nodes
identified. No evidence of abdominal aortic aneurysm.

Reproductive:  No mass or other significant abnormality.

Other:  None.

Musculoskeletal: No suspicious bone lesions identified. Diffuse
osteosclerosis, consistent with sickle cell disease.
IMPRESSION: Colitis involving the ascending and transverse colon.  Mild ascites.

No evidence of urolithiasis or hydronephrosis.

Mild atelectasis or scarring in left lung base.

Diffuse osteosclerosis and auto-splenectomy, consistent with chronic
sickle cell disease.

## 2018-09-12 IMAGING — DX DG CHEST 1V PORT
1 series · 1 of 1 positions shown · non-contrast
Comparison: 02/27/2017

CLINICAL DATA: Sickle cell pain crisis.  Right flank pain.

EXAM:
PORTABLE CHEST 1 VIEW

[chest ap]
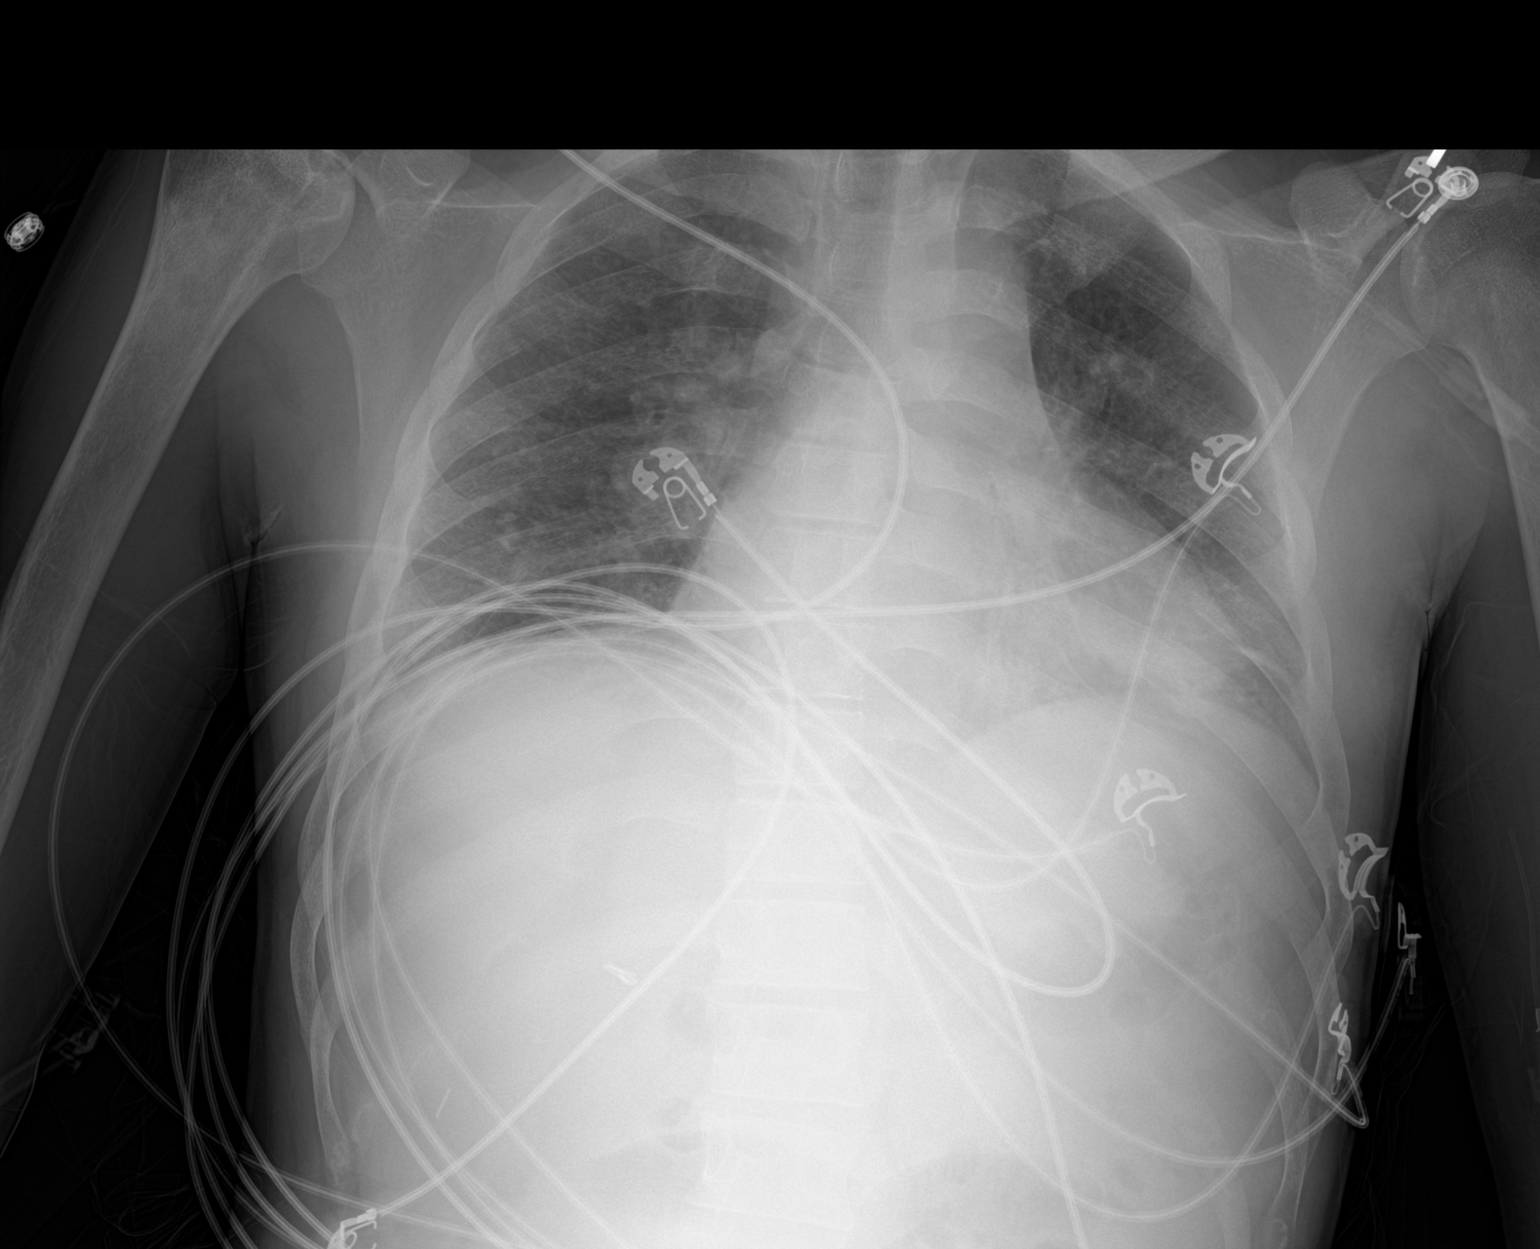

[1 of 1 positions shown; findings below may reference images not displayed]

FINDINGS: The heart is normal in size allowing for AP technique and low lung
volumes. Lungs are under aerated with bibasilar atelectasis. No
pneumothorax or pleural effusion. There are sclerotic changes and
articular surface collapse involving the right humeral head
consistent with avascular necrosis and bone infarct.
IMPRESSION: Bibasilar atelectasis.

Bony changes related to sickle cell disorder and bone infarct.

## 2018-09-16 DIAGNOSIS — E875 Hyperkalemia: Secondary | ICD-10-CM | POA: Diagnosis not present

## 2018-09-16 DIAGNOSIS — E559 Vitamin D deficiency, unspecified: Secondary | ICD-10-CM | POA: Diagnosis not present

## 2018-09-23 ENCOUNTER — Other Ambulatory Visit: Payer: Self-pay

## 2018-09-23 ENCOUNTER — Inpatient Hospital Stay (HOSPITAL_COMMUNITY)
Admission: EM | Admit: 2018-09-23 | Discharge: 2018-09-28 | DRG: 371 | Disposition: A | Discharge status: Home / Self Care | Payer: Medicare Other | Attending: Internal Medicine | Admitting: Internal Medicine

## 2018-09-23 ENCOUNTER — Telehealth (HOSPITAL_COMMUNITY): Payer: Self-pay

## 2018-09-23 ENCOUNTER — Emergency Department (HOSPITAL_COMMUNITY): Payer: Medicare Other

## 2018-09-23 ENCOUNTER — Encounter (HOSPITAL_COMMUNITY): Payer: Self-pay | Admitting: Emergency Medicine

## 2018-09-23 DIAGNOSIS — Z9114 Patient's other noncompliance with medication regimen: Secondary | ICD-10-CM | POA: Diagnosis not present

## 2018-09-23 DIAGNOSIS — N058 Unspecified nephritic syndrome with other morphologic changes: Secondary | ICD-10-CM | POA: Diagnosis present

## 2018-09-23 DIAGNOSIS — G894 Chronic pain syndrome: Secondary | ICD-10-CM | POA: Diagnosis not present

## 2018-09-23 DIAGNOSIS — Z9081 Acquired absence of spleen: Secondary | ICD-10-CM | POA: Diagnosis not present

## 2018-09-23 DIAGNOSIS — N049 Nephrotic syndrome with unspecified morphologic changes: Secondary | ICD-10-CM | POA: Diagnosis not present

## 2018-09-23 DIAGNOSIS — D72829 Elevated white blood cell count, unspecified: Secondary | ICD-10-CM | POA: Diagnosis not present

## 2018-09-23 DIAGNOSIS — N184 Chronic kidney disease, stage 4 (severe): Secondary | ICD-10-CM | POA: Diagnosis not present

## 2018-09-23 DIAGNOSIS — F17210 Nicotine dependence, cigarettes, uncomplicated: Secondary | ICD-10-CM | POA: Diagnosis present

## 2018-09-23 DIAGNOSIS — F1410 Cocaine abuse, uncomplicated: Secondary | ICD-10-CM | POA: Diagnosis present

## 2018-09-23 DIAGNOSIS — K3533 Acute appendicitis with perforation and localized peritonitis, with abscess: Secondary | ICD-10-CM

## 2018-09-23 DIAGNOSIS — D5700 Hb-SS disease with crisis, unspecified: Secondary | ICD-10-CM | POA: Diagnosis present

## 2018-09-23 DIAGNOSIS — Z8701 Personal history of pneumonia (recurrent): Secondary | ICD-10-CM | POA: Diagnosis not present

## 2018-09-23 DIAGNOSIS — M60009 Infective myositis, unspecified site: Secondary | ICD-10-CM

## 2018-09-23 DIAGNOSIS — R079 Chest pain, unspecified: Secondary | ICD-10-CM | POA: Diagnosis not present

## 2018-09-23 DIAGNOSIS — Z886 Allergy status to analgesic agent status: Secondary | ICD-10-CM | POA: Diagnosis not present

## 2018-09-23 DIAGNOSIS — Z9049 Acquired absence of other specified parts of digestive tract: Secondary | ICD-10-CM | POA: Diagnosis not present

## 2018-09-23 DIAGNOSIS — M545 Low back pain, unspecified: Secondary | ICD-10-CM | POA: Diagnosis present

## 2018-09-23 DIAGNOSIS — Z79899 Other long term (current) drug therapy: Secondary | ICD-10-CM

## 2018-09-23 DIAGNOSIS — R000 Tachycardia, unspecified: Secondary | ICD-10-CM | POA: Diagnosis not present

## 2018-09-23 DIAGNOSIS — B9689 Other specified bacterial agents as the cause of diseases classified elsewhere: Secondary | ICD-10-CM | POA: Diagnosis present

## 2018-09-23 DIAGNOSIS — F1120 Opioid dependence, uncomplicated: Secondary | ICD-10-CM | POA: Diagnosis present

## 2018-09-23 DIAGNOSIS — Z7952 Long term (current) use of systemic steroids: Secondary | ICD-10-CM | POA: Diagnosis not present

## 2018-09-23 DIAGNOSIS — K6812 Psoas muscle abscess: Principal | ICD-10-CM | POA: Diagnosis present

## 2018-09-23 DIAGNOSIS — Z20828 Contact with and (suspected) exposure to other viral communicable diseases: Secondary | ICD-10-CM | POA: Diagnosis not present

## 2018-09-23 DIAGNOSIS — Z885 Allergy status to narcotic agent status: Secondary | ICD-10-CM | POA: Diagnosis not present

## 2018-09-23 DIAGNOSIS — R109 Unspecified abdominal pain: Secondary | ICD-10-CM | POA: Diagnosis not present

## 2018-09-23 DIAGNOSIS — N189 Chronic kidney disease, unspecified: Secondary | ICD-10-CM | POA: Diagnosis not present

## 2018-09-23 DIAGNOSIS — R0789 Other chest pain: Secondary | ICD-10-CM | POA: Diagnosis not present

## 2018-09-23 DIAGNOSIS — R0602 Shortness of breath: Secondary | ICD-10-CM | POA: Diagnosis not present

## 2018-09-23 DIAGNOSIS — D57219 Sickle-cell/Hb-C disease with crisis, unspecified: Secondary | ICD-10-CM | POA: Diagnosis not present

## 2018-09-23 LAB — URINALYSIS, ROUTINE W REFLEX MICROSCOPIC
Bacteria, UA: NONE SEEN
Bilirubin Urine: NEGATIVE
Glucose, UA: 50 mg/dL — AB
Hgb urine dipstick: NEGATIVE
Ketones, ur: NEGATIVE mg/dL
Nitrite: NEGATIVE
Protein, ur: >=300 mg/dL — AB
Specific Gravity, Urine: 1.009 (ref 1.005–1.030)
pH: 7 (ref 5.0–8.0)

## 2018-09-23 LAB — COMPREHENSIVE METABOLIC PANEL
ALT: 26 U/L (ref 0–44)
AST: 20 U/L (ref 15–41)
Albumin: 2.9 g/dL — ABNORMAL LOW (ref 3.5–5.0)
Alkaline Phosphatase: 138 U/L — ABNORMAL HIGH (ref 38–126)
Anion gap: 10 (ref 5–15)
BUN: 39 mg/dL — ABNORMAL HIGH (ref 6–20)
CO2: 16 mmol/L — ABNORMAL LOW (ref 22–32)
Calcium: 8.5 mg/dL — ABNORMAL LOW (ref 8.9–10.3)
Chloride: 111 mmol/L (ref 98–111)
Creatinine, Ser: 2.46 mg/dL — ABNORMAL HIGH (ref 0.61–1.24)
GFR calc Af Amer: 38 mL/min — ABNORMAL LOW (ref >60)
GFR calc non Af Amer: 33 mL/min — ABNORMAL LOW (ref >60)
Glucose, Bld: 96 mg/dL (ref 70–99)
Potassium: 4.2 mmol/L (ref 3.5–5.1)
Sodium: 137 mmol/L (ref 135–145)
Total Bilirubin: 0.7 mg/dL (ref 0.3–1.2)
Total Protein: 6.6 g/dL (ref 6.5–8.1)

## 2018-09-23 LAB — RAPID URINE DRUG SCREEN, HOSP PERFORMED
Amphetamines: NOT DETECTED
Barbiturates: NOT DETECTED
Benzodiazepines: NOT DETECTED
Cocaine: POSITIVE — AB
Opiates: NOT DETECTED
Tetrahydrocannabinol: POSITIVE — AB

## 2018-09-23 LAB — CBC WITH DIFFERENTIAL/PLATELET
Abs Immature Granulocytes: 0.28 10*3/uL — ABNORMAL HIGH (ref 0.00–0.07)
Basophils Absolute: 0 10*3/uL (ref 0.0–0.1)
Basophils Relative: 0 %
Eosinophils Absolute: 0 10*3/uL (ref 0.0–0.5)
Eosinophils Relative: 0 %
HCT: 22.2 % — ABNORMAL LOW (ref 39.0–52.0)
Hemoglobin: 7.1 g/dL — ABNORMAL LOW (ref 13.0–17.0)
Immature Granulocytes: 1 %
Lymphocytes Relative: 19 %
Lymphs Abs: 4.1 10*3/uL — ABNORMAL HIGH (ref 0.7–4.0)
MCH: 37.6 pg — ABNORMAL HIGH (ref 26.0–34.0)
MCHC: 32 g/dL (ref 30.0–36.0)
MCV: 117.5 fL — ABNORMAL HIGH (ref 80.0–100.0)
Monocytes Absolute: 0.9 10*3/uL (ref 0.1–1.0)
Monocytes Relative: 4 %
Neutro Abs: 16.7 10*3/uL — ABNORMAL HIGH (ref 1.7–7.7)
Neutrophils Relative %: 76 %
Platelets: 306 10*3/uL (ref 150–400)
RBC: 1.89 MIL/uL — ABNORMAL LOW (ref 4.22–5.81)
RDW: 29.9 % — ABNORMAL HIGH (ref 11.5–15.5)
WBC: 22 10*3/uL — ABNORMAL HIGH (ref 4.0–10.5)
nRBC: 5.6 % — ABNORMAL HIGH (ref 0.0–0.2)

## 2018-09-23 LAB — CT RENAL STONE STUDY

## 2018-09-23 LAB — RETICULOCYTES
Immature Retic Fract: 30.8 % — ABNORMAL HIGH (ref 2.3–15.9)
RBC.: 1.89 MIL/uL — ABNORMAL LOW (ref 4.22–5.81)
Retic Count, Absolute: 108.5 10*3/uL (ref 19.0–186.0)
Retic Ct Pct: 5.7 % — ABNORMAL HIGH (ref 0.4–3.1)

## 2018-09-23 LAB — SARS CORONAVIRUS 2 BY RT PCR (HOSPITAL ORDER, PERFORMED IN ~~LOC~~ HOSPITAL LAB): SARS Coronavirus 2: NEGATIVE

## 2018-09-23 LAB — LACTIC ACID, PLASMA
Lactic Acid, Venous: 0.9 mmol/L (ref 0.5–1.9)
Lactic Acid, Venous: 1.3 mmol/L (ref 0.5–1.9)

## 2018-09-23 MED ORDER — HYDROMORPHONE HCL 1 MG/ML IJ SOLN
1.0000 mg | INTRAMUSCULAR | Status: DC | PRN
  Administered 2018-09-23 – 2018-09-24 (×4): 2 mg via INTRAVENOUS
  Filled 2018-09-23 (×4): qty 2

## 2018-09-23 MED ORDER — PIPERACILLIN-TAZOBACTAM 3.375 G IVPB 30 MIN: 3.3750 g | Freq: Once | INTRAVENOUS | Status: DC

## 2018-09-23 MED ORDER — SODIUM CHLORIDE 0.45 % IV SOLN
INTRAVENOUS | Status: DC
  Administered 2018-09-23 – 2018-09-26 (×6): via INTRAVENOUS

## 2018-09-23 MED ORDER — HYDROMORPHONE HCL 2 MG/ML IJ SOLN
2.0000 mg | INTRAMUSCULAR | Status: AC
  Administered 2018-09-23: 2 mg via INTRAVENOUS
  Filled 2018-09-23: qty 1

## 2018-09-23 MED ORDER — VITAMIN B-12 1000 MCG PO TABS
2000.0000 ug | ORAL_TABLET | Freq: Every day | ORAL | Status: DC
  Administered 2018-09-23 – 2018-09-28 (×6): 2000 ug via ORAL
  Filled 2018-09-23 (×6): qty 2

## 2018-09-23 MED ORDER — OXYCODONE HCL ER 15 MG PO T12A
15.0000 mg | EXTENDED_RELEASE_TABLET | Freq: Two times a day (BID) | ORAL | Status: DC
  Administered 2018-09-23 – 2018-09-28 (×10): 15 mg via ORAL
  Filled 2018-09-23 (×10): qty 1

## 2018-09-23 MED ORDER — HYDROMORPHONE HCL 2 MG/ML IJ SOLN: 2.0000 mg | INTRAMUSCULAR | Status: DC

## 2018-09-23 MED ORDER — POLYETHYLENE GLYCOL 3350 17 G PO PACK: 17.0000 g | PACK | Freq: Every day | ORAL | Status: DC | PRN

## 2018-09-23 MED ORDER — ACETAMINOPHEN 325 MG PO TABS: 650.0000 mg | ORAL_TABLET | Freq: Four times a day (QID) | ORAL | Status: DC | PRN

## 2018-09-23 MED ORDER — SODIUM CHLORIDE 0.9 % IV SOLN
500.0000 mg | Freq: Once | INTRAVENOUS | Status: AC
  Administered 2018-09-23: 500 mg via INTRAVENOUS
  Filled 2018-09-23: qty 500

## 2018-09-23 MED ORDER — DEXTROSE-NACL 5-0.45 % IV SOLN
INTRAVENOUS | Status: DC
  Administered 2018-09-23: 13:00:00 via INTRAVENOUS

## 2018-09-23 MED ORDER — VITAMIN D 25 MCG (1000 UNIT) PO TABS
2000.0000 [IU] | ORAL_TABLET | Freq: Every day | ORAL | Status: DC
  Administered 2018-09-23 – 2018-09-28 (×6): 2000 [IU] via ORAL
  Filled 2018-09-23 (×6): qty 2

## 2018-09-23 MED ORDER — VANCOMYCIN HCL IN DEXTROSE 1-5 GM/200ML-% IV SOLN
1000.0000 mg | Freq: Once | INTRAVENOUS | Status: AC
  Administered 2018-09-23: 1000 mg via INTRAVENOUS
  Filled 2018-09-23: qty 200

## 2018-09-23 MED ORDER — HYDROMORPHONE HCL 2 MG/ML IJ SOLN: 2.0000 mg | INTRAMUSCULAR | Status: AC

## 2018-09-23 MED ORDER — ONDANSETRON HCL 4 MG/2ML IJ SOLN
4.0000 mg | INTRAMUSCULAR | Status: DC | PRN
  Administered 2018-09-23: 4 mg via INTRAVENOUS
  Filled 2018-09-23: qty 2

## 2018-09-23 MED ORDER — PIPERACILLIN-TAZOBACTAM 3.375 G IVPB
3.3750 g | Freq: Three times a day (TID) | INTRAVENOUS | Status: DC
  Administered 2018-09-24 – 2018-09-25 (×3): 3.375 g via INTRAVENOUS
  Filled 2018-09-23 (×4): qty 50

## 2018-09-23 MED ORDER — SODIUM CHLORIDE 0.9 % IV SOLN
1.0000 g | Freq: Once | INTRAVENOUS | Status: AC
  Administered 2018-09-23: 1 g via INTRAVENOUS
  Filled 2018-09-23: qty 10

## 2018-09-23 MED ORDER — PREDNISONE 20 MG PO TABS
20.0000 mg | ORAL_TABLET | Freq: Two times a day (BID) | ORAL | Status: DC
  Administered 2018-09-24 – 2018-09-28 (×9): 20 mg via ORAL
  Filled 2018-09-23 (×9): qty 1

## 2018-09-23 MED ORDER — PIPERACILLIN-TAZOBACTAM 3.375 G IVPB 30 MIN: 3.3750 g | INTRAVENOUS | Status: DC

## 2018-09-23 MED ORDER — BISACODYL 10 MG RE SUPP: 10.0000 mg | Freq: Every day | RECTAL | Status: DC | PRN

## 2018-09-23 MED ORDER — NORTRIPTYLINE HCL 25 MG PO CAPS
25.0000 mg | ORAL_CAPSULE | Freq: Every evening | ORAL | Status: DC | PRN
  Filled 2018-09-23: qty 1

## 2018-09-23 MED ORDER — VANCOMYCIN HCL IN DEXTROSE 1-5 GM/200ML-% IV SOLN
1000.0000 mg | INTRAVENOUS | Status: DC
  Administered 2018-09-25: 1000 mg via INTRAVENOUS

## 2018-09-23 MED ORDER — ACETAMINOPHEN 650 MG RE SUPP: 650.0000 mg | Freq: Four times a day (QID) | RECTAL | Status: DC | PRN

## 2018-09-23 MED ORDER — FOLIC ACID 1 MG PO TABS
1.0000 mg | ORAL_TABLET | Freq: Every day | ORAL | Status: DC
  Administered 2018-09-24 – 2018-09-28 (×5): 1 mg via ORAL
  Filled 2018-09-23 (×5): qty 1

## 2018-09-23 MED ORDER — HEPARIN SODIUM (PORCINE) 5000 UNIT/ML IJ SOLN
5000.0000 [IU] | Freq: Three times a day (TID) | INTRAMUSCULAR | Status: DC
  Administered 2018-09-25 – 2018-09-27 (×4): 5000 [IU] via SUBCUTANEOUS
  Filled 2018-09-23 (×6): qty 1

## 2018-09-23 MED ORDER — HYDROMORPHONE HCL 1 MG/ML IJ SOLN
0.5000 mg | Freq: Once | INTRAMUSCULAR | Status: AC
  Administered 2018-09-23: 0.5 mg via INTRAVENOUS
  Filled 2018-09-23: qty 0.5

## 2018-09-23 NOTE — Progress Notes (Signed)
Pharmacy Antibiotic Note  [name] [name]. is a 38 y.o. male admitted on 09/23/2018 with iliacus abscess.  Pharmacy has been consulted for Vancomycin & Zosyn dosing. PMH significant for sickle cell disease, chronic kidney disease.   09/23/2018:  Afebrile  WBC elevated (22K)  Scr 2.46- slightly above baseline (2.2-2.3), est CrCl ~ 16ml/min  CXR + developing PNA  CT + iliacus muscle abscess -->planning to drain 8/11  Plan: Zosyn 3.375gm IV Q8h to be infused over 4hrs Vancomycin 1gm IV x1 given in ED followed by 1gm IV q36h Monitor renal function and cx data  Check Vancomycin levels at steady state    Temp (24hrs), Avg:99.1 F (37.3 C), Min:99.1 F (37.3 C), Max:99.1 F (37.3 C)  Recent Labs  Lab 09/23/18 1140  WBC 22.0*  CREATININE 2.46*    CrCl cannot be calculated (Unknown ideal weight.).    Allergies  Allergen Reactions  . Nsaids Other (See Comments)    Kidney disease  . Morphine And Related Other (See Comments)    "Real bad headaches"     Antimicrobials this admission: 8/10 Vancomycin >>  8/10 Rocephin/Zithromax x1 8/10 Zosyn>>   Dose adjustments this admission:  Microbiology results: 8/10 BCx:  8/10 UCx:   8/10 COVID: negative  Thank you for allowing pharmacy to be a part of this patient's care.  Biagio Borg 09/23/2018 6:05 PM

## 2018-09-23 NOTE — Telephone Encounter (Signed)
Patient called requesting to come to the day hospital for sickle cell pain. Patient reported leg and back pain rated 10/10. Reports last taking oxycontin for pain at 0600. Endorses chest pain and SOB, and was referred to the ED for further evaluation. Provider notified.

## 2018-09-23 NOTE — ED Triage Notes (Signed)
Pt c/o right flank pains and sickle cell pains that started last night. Denies n/v/d or urinary problems.

## 2018-09-23 NOTE — H&P (Signed)
[facility] History and Physical  Kavi A Marcial Pacas. SFK:[id number] DOB: 1980-11-04 DOA: 09/23/2018  Referring physician: Dr Cyril Mourning Ward PCP: [name], MD   Chief Complaint: Back pain  HPI: [name] [name]. is a 38 y.o. male with hx of sickle cell diseas, CKD IV, opioid dependence, migraines, GERD, and hx bacterial PNA , h/o splenectomy presents to ED with R lower back pain. Is not like his sickle pain, has "never had pain like this" per ED notes.  Some CP and SOB. No abd symptoms, n/v/d.  Hx cholecystectomy. In ED patient had a CT renal stone study which showed an apparent iliacus muscle abscess at the level of the mid right sacrum measuring 7 x 3 cm. There were bony reactive changes in the area. WBC is up at 22k which is unusually high for this patient, temp is 99 deg.    IR was called by ED and they will see the patient. We are asked to admit patient.   Patient was started on po prednisone , "by my kidney doctor" about 2 months ago, was supposed to be taking 91m / day but due to side effects (facial swelling, etc) he is only taking 429m  [facility]    Patient adamantly denies any injection use , ever.  He did test + for cocaine and THC on the UDS.    He has had 6 admissions this year, all for painful sickle crises.   His pain started recently in the past few days and is in the R lower back, severe to touch, no ant abd pain, no sig n/v/d.  Denies chills or rigors. No SOB or CP. No voiding issues, no constipation.   ROS  denies CP  no joint pain   no HA  no blurry vision  no rash  no diarrhea  no nausea/ vomiting  no dysuria  no difficulty voiding  no change in urine color    Past Medical History  Past Medical History:  Diagnosis Date  . Arachnoid Cyst of brain bilaterally    "2 really small ones in the back of my head; inside; saw them w/MRI" (09/25/2012)  . Bacterial pneumonia ~ 2012   "caught it here in the  hospital" (09/25/2012)  . Chronic kidney disease    "from my sickle cell" (09/25/2012)  . CKD (chronic kidney disease), stage II   . GERD (gastroesophageal reflux disease)    "after I eat alot of spicey foods" (09/25/2012)  . Gynecomastia, male 07/10/2012  . History of blood transfusion    "always related to sickle cell crisis" (09/25/2012)  . 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   [facility] Migraines    "take RX qd to prevent them" (09/25/2012)  . [facility]  . Sickle cell anemia (HCC)   . [facility]  . S[place] [place] [facility]  . Sinus tachycardia   . Tachycardia with heart rate 121-140 beats per minute with ambulation 08/04/2016   Past Surgical History  Past Surgical History:  Procedure Laterality Date  . CHOLECYSTECTOMY  ~ 2012  . COLONOSCOPY N/A 04/23/2017   Procedure: COLONOSCOPY;  Surgeon: PeIrene ShipperMD;  [facility];  Service: Endoscopy;  Laterality: N/A;  . IR FLUORO GUIDE CV LINE RIGHT  12/17/2016  . IR REMOVAL TUN CV CATH W/O FL  12/21/2016  . IR [place]  GUIDE VASC ACCESS RIGHT  12/17/2016  . spleenectomy     Family History  Family History  Problem Relation Age of Onset  . Breast cancer Mother    Social History  reports that he has been smoking cigarettes. He has been smoking about 0.10 packs per day. He has never used smokeless tobacco. He reports current alcohol use. He reports previous drug use. Drug: Marijuana. Allergies  Allergies  Allergen Reactions  . Nsaids Other (See Comments)    Kidney disease  . Morphine And Related Other (See Comments)    "Real bad headaches"    Home medications Prior to Admission medications   Medication Sig Start Date End Date Taking? Authorizing Provider  cholecalciferol (VITAMIN D3) 25 MCG (1000 UT) tablet Take 2,000 Units by mouth daily.   Yes [provider]  [facility] VITAMIN B12 1000  MCG tablet Take 2,000 mcg by mouth daily. 06/21/18  Yes [provider]  folic acid (FOLVITE) 1 MG tablet Take 1 tablet (1 mg total) daily by mouth. 12/22/16  Yes Leana Gamer, MD  hydroxyurea (HYDREA) 500 MG capsule TAKE 3 CAPSULES BY MOUTH DAILY Patient taking differently: Take 1,500 mg by mouth daily.  06/19/18  Yes Lanae Boast, FNP  nortriptyline (PAMELOR) 25 MG capsule Take 1 capsule (25 mg total) by mouth at bedtime as needed for sleep. 06/10/18  Yes Donnamae Jude, MD  oxyCODONE (OXYCONTIN) 15 mg 12 hr tablet Take 1 tablet (15 mg total) by mouth every 12 (twelve) hours. 08/30/18  Yes [name], FNP  Oxycodone HCl 10 MG TABS Take 1 tablet (10 mg total) by mouth every 6 (six) hours as needed. Patient taking differently: Take 10 mg by mouth every 6 (six) hours as needed (pain).  08/30/18  Yes [name], FNP  predniSONE (DELTASONE) 20 MG tablet Take 20 mg by mouth 3 (three) times daily.  06/26/18  Yes [provider]  ergocalciferol (DRISDOL) 1.25 MG (50000 UT) capsule Take 1 capsule (50,000 Units total) by mouth once a week. Patient not taking: Reported on 09/23/2018 04/05/18   Lanae Boast, FNP   Liver Function Tests Recent Labs  Lab 09/23/18 1140  AST 20  ALT 26  ALKPHOS 138*  BILITOT 0.7  PROT 6.6  ALBUMIN 2.9*   No results for input(s): LIPASE, AMYLASE in the last 168 hours. CBC Recent Labs  Lab 09/23/18 1140  WBC 22.0*  NEUTROABS 16.7*  HGB 7.1*  HCT 22.2*  MCV 117.5*  PLT 381   Basic Metabolic Panel Recent Labs  Lab 09/23/18 1140  NA 137  K 4.2  CL 111  CO2 16*  GLUCOSE 96  BUN 39*  CREATININE 2.46*  CALCIUM 8.5*     Vitals:   09/23/18 1500 09/23/18 1530 09/23/18 1604 09/23/18 1630  BP: 125/86 115/80 96/73 (!) 108/92  Pulse:  (!) 118 (!) 105 (!) 109  Resp: 11 (!) 24 (!) 35 17  Temp:      SpO2:  97% 99% 99%   Exam: Gen alert, a bit cushingoid in the face No rash, cyanosis or gangrene Sclera anicteric, throat  clear  No jvd or bruits Chest clear bilat to bases, no rales or wheezing RRR no MRG Abd soft ntnd no mass or ascites +bs R lower back tender to palpation, no bruising or erythema, no lesions or drainage GU normal male MS no joint effusions or deformity Ext no LE or UE edema / no wounds or ulcers Neuro is alert, Ox 3 ,  nf     Home meds:  - hydroxyurea 1500 mg qd  - oxycodone 10 qid prn/ oxycontin 15 bid  - prednisone 20 tid  - nortriptyline 25 hs prn sleep  - prn's/ vitamins/ supplements   Na 137  K 4.2 CO2 16  BUN 39  CR 2.46  Ca 8.5  Alb 2.9  LFT's ok eGFR 38  WBC 22k  Hb 7.1 (baseline 6.0- 8.5 this year)  plt 306  EKG (independ reviewed) > sinus tach 116, no acute changes CXR (independ reviewed) > IMPRESSION: Increasing opacity in the mesial bases, which could represent developing pneumonia or atelectasis.  CT renal stone > There is an apparent iliacus muscle abscess at the level of the mid right sacrum measuring 7.7 x 6.7 x 3.2 cm. There is bony reactive change in this area.  Assessment: 1. Fever / back pain severe - due to R iliacus abscess, seen on CT abdomen today. No hx IVDU. Is on prednisone for neph syndrome so immunosuppression may be biggest risk factor. IR consulted by ED for potential aspiration. Started on IV abx in ED, will adjust to vanc/ zosyn. Blood cx's sent. IV prn pain meds, cont oxycontin po bid home dose.  NPO after MN.  2. Sickle cell disease - on hydroxyurea, will ask pharm about continue or not 3. CKD IV/ neph syndrome/ ICGN - on prednisone 40-50 mg /day last 2 mos, w/ f/u visit in Aug coming up.  Cont pred 40 qd here. Creat 2.5 baseline 4. Volume - looks euvolemic to a little bit dry. Give IVF's 65/ hr cautiously. 5. Anemia sickle cell and CKD - Hb near baseline at 7.1, follow   Plan - as above   DVT prophylaxis: lovenox to start tomorrow Family communication: none here Code status: full Admit status: INP Bed type: med surg    Kelly Splinter MD   [facility] [phone] 09/23/2018, 5:42 PM  If 7PM-7AM, please contact night-coverage [email or web address] Password [facility] 09/23/2018, 5:42 PM

## 2018-09-23 NOTE — ED Notes (Signed)
ED TO INPATIENT HANDOFF REPORT  Name/Age/Gender [name] [name]. 38 y.o. male  Code Status Code Status History    Date Active Date Inactive Code Status Order ID Comments User Context   08/19/2018 0254 08/21/2018 1707 Full Code [id number]  [place] J, DO ED   08/09/2018 1352 08/12/2018 1753 Full Code [id number]  [name], [place] ED   07/22/2018 0342 07/24/2018 1522 Full Code [id number]  [place], [place], DO Inpatient   07/03/2018 2120 07/06/2018 2032 Full Code [id number]  [name], MD ED   05/05/2018 2214 05/08/2018 1455 Full Code [id number]  [name], MD ED   03/07/2018 0043 03/11/2018 1656 Full Code [id number]  Ivor Costa, MD ED   01/21/2018 2008 01/23/2018 1834 Full Code [id number]  Jonetta Osgood, MD ED   09/19/2017 2214 09/22/2017 1633 Full Code [id number]  Bethena Roys, MD ED   07/23/2017 2234 07/26/2017 1527 Full Code [id number]  [place], [place], DO Inpatient   05/12/2017 1514 05/18/2017 1850 Full Code [id number]  [name], MD Inpatient   04/20/2017 0205 05/03/2017 1855 Full Code [id number]  [name], MD ED   04/07/2017 1712 04/09/2017 1238 Full Code [id number]  Marene Lenz, MD ED   02/27/2017 2140 03/04/2017 1508 Full Code [id number]  Jani Gravel, MD ED   12/13/2016 1812 12/21/2016 1659 Full Code [id number]  Leana Gamer, MD ED   10/14/2016 1944 10/18/2016 1637 Full Code [id number]  Jani Gravel, MD ED   08/02/2016 0124 08/04/2016 1731 Full Code [id number]  Norval Morton, MD ED   02/26/2016 2148 02/27/2016 1750 Full Code [id number]  Etta Quill, DO ED   03/11/2015 1636 03/15/2015 1544 Full Code [id number]  Janora Norlander, DO ED   11/21/2014 0308 11/24/2014 2020 Full Code [id number]  [place], York Ram, MD Inpatient   08/18/2014 0148 08/20/2014 1820 Full Code [id number]  Archie Patten, MD Inpatient   03/22/2014 2315 03/24/2014 1826 Full Code [id number]  Toy Baker, MD ED   03/19/2014 2048 03/20/2014 2250 Full Code [id number]  Lorna Few, DO Inpatient   12/26/2013 0344 12/29/2013 2103 Full Code [id number]  Timmothy Euler, MD ED   07/28/2013 1833 07/31/2013 1729 Full Code [id number]  Coral Spikes, DO Inpatient   05/09/2013 1926 05/10/2013 1644 Full Code [id number]  Hilton Sinclair, MD ED   01/24/2013 0143 01/26/2013 1931 Full Code [id number]  Leone Haven, MD Inpatient   09/25/2012 0846 09/26/2012 2020 Full Code [id number]  Ma Hillock, DO ED   Advance Care Planning Activity      Home/SNF/Other Home  Chief Complaint sickle cell cell chest pain flank pain  Level of Care/Admitting Diagnosis ED Disposition    ED Disposition Condition [place]: [place] [[id number]]  Level of Care: Med-Surg [16]  Covid Evaluation: Asymptomatic Screening Protocol (No Symptoms)  Diagnosis: Iliopsoas abscess [facility]) [id number]  Admitting Physician: Roney Jaffe [[id number]]  Attending Physician: Roney Jaffe [[id number]]  Estimated length of stay: 5 - 7 days  Certification:: I certify this patient will need inpatient services for at least 2 midnights  PT Class (Do Not Modify): Inpatient [101]  PT Acc Code (Do Not Modify): Private [1]       Medical History Past Medical History:  Diagnosis Date  . Arachnoid Cyst of brain bilaterally    "2 really small ones in the back of my head; inside; saw them w/MRI" (09/25/2012)  . Bacterial pneumonia ~ 2012   "  caught it here in the hospital" (09/25/2012)  . Chronic kidney disease    "from my sickle cell" (09/25/2012)  . CKD (chronic kidney disease), stage II   . GERD (gastroesophageal reflux disease)    "after I eat alot of spicey foods" (09/25/2012)  . Gynecomastia, male 07/10/2012  . History of blood transfusion    "always related to sickle cell crisis" (09/25/2012)  . 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   Noted in noted from Hematology notes at [facility]  . Migraines    "take RX qd to prevent them" (09/25/2012)  . Opioid  dependence with withdrawal ([place]) 08/30/2012  . Sickle cell anemia (HCC)   . Sickle cell crisis ([place]) 09/25/2012  . Sickle cell nephropathy ([place]) 07/10/2012  . Sinus tachycardia   . Tachycardia with heart rate 121-140 beats per minute with ambulation 08/04/2016    Allergies Allergies  Allergen Reactions  . Nsaids Other (See Comments)    Kidney disease  . Morphine And Related Other (See Comments)    "Real bad headaches"     IV Location/Drains/Wounds Patient Lines/Drains/Airways Status   Active Line/Drains/Airways    Name:   Placement date:   Placement time:   Site:   Days:   Peripheral IV 09/23/18 Right Wrist   09/23/18    1317    Wrist   less than 1   Peripheral IV 09/23/18 Left;Anterior   09/23/18    1338    -   less than 1   Peripheral IV 09/23/18 Right;Upper Arm   09/23/18    1528    Arm   less than 1          Labs/Imaging Results for orders placed or performed during the hospital encounter of 09/23/18 (from the past 48 hour(s))  Comprehensive metabolic panel     Status: Abnormal   Collection Time: 09/23/18 11:40 AM  Result Value Ref Range   Sodium 137 135 - 145 mmol/L   Potassium 4.2 3.5 - 5.1 mmol/L   Chloride 111 98 - 111 mmol/L   CO2 16 (L) 22 - 32 mmol/L   Glucose, Bld 96 70 - 99 mg/dL   BUN 39 (H) 6 - 20 mg/dL   Creatinine, Ser 2.46 (H) 0.61 - 1.24 mg/dL   Calcium 8.5 (L) 8.9 - 10.3 mg/dL   Total Protein 6.6 6.5 - 8.1 g/dL   Albumin 2.9 (L) 3.5 - 5.0 g/dL   AST 20 15 - 41 U/L   ALT 26 0 - 44 U/L   Alkaline Phosphatase 138 (H) 38 - 126 U/L   Total Bilirubin 0.7 0.3 - 1.2 mg/dL   GFR calc non Af Amer 33 (L) >60 mL/min   GFR calc Af Amer 38 (L) >60 mL/min   Anion gap 10 5 - 15    Comment: Performed at [facility], [place] [address], [place], [place] [address]  CBC with Differential     Status: Abnormal   Collection Time: 09/23/18 11:40 AM  Result Value Ref Range   WBC 22.0 (H) 4.0 - 10.5 K/uL   RBC 1.89 (L) 4.22 - 5.81 MIL/uL   Hemoglobin 7.1  (L) 13.0 - 17.0 g/dL   HCT 22.2 (L) 39.0 - 52.0 %   MCV 117.5 (H) 80.0 - 100.0 fL   MCH 37.6 (H) 26.0 - 34.0 pg   MCHC 32.0 30.0 - 36.0 g/dL   RDW 29.9 (H) 11.5 - 15.5 %   Platelets 306 150 - 400 K/uL   nRBC  5.6 (H) 0.0 - 0.2 %   Neutrophils Relative % 76 %   Neutro Abs 16.7 (H) 1.7 - 7.7 K/uL   Lymphocytes Relative 19 %   Lymphs Abs 4.1 (H) 0.7 - 4.0 K/uL   Monocytes Relative 4 %   Monocytes Absolute 0.9 0.1 - 1.0 K/uL   Eosinophils Relative 0 %   Eosinophils Absolute 0.0 0.0 - 0.5 K/uL   Basophils Relative 0 %   Basophils Absolute 0.0 0.0 - 0.1 K/uL   Immature Granulocytes 1 %   Abs Immature Granulocytes 0.28 (H) 0.00 - 0.07 K/uL   Tammy Sours Bodies PRESENT    Polychromasia PRESENT     Comment: Performed at [facility], [place] [address], [place], [place] [address]  Reticulocytes     Status: Abnormal   Collection Time: 09/23/18 11:40 AM  Result Value Ref Range   Retic Ct Pct 5.7 (H) 0.4 - 3.1 %   RBC. 1.89 (L) 4.22 - 5.81 MIL/uL   Retic Count, Absolute 108.5 19.0 - 186.0 K/uL   Immature Retic Fract 30.8 (H) 2.3 - 15.9 %    Comment: Performed at [facility], [place] [address], [place], [place] [address]  SARS Coronavirus 2 [facility] order, Performed in [facility] hospital lab) Nasopharyngeal Nasopharyngeal Swab     Status: None   Collection Time: 09/23/18  3:06 PM   Specimen: Nasopharyngeal Swab  Result Value Ref Range   SARS Coronavirus 2 NEGATIVE NEGATIVE    Comment: (NOTE) If result is NEGATIVE SARS-CoV-2 target nucleic acids are NOT DETECTED. The SARS-CoV-2 RNA is generally detectable in upper and lower  respiratory specimens during the acute phase of infection. The lowest  concentration of SARS-CoV-2 viral copies this assay can detect is 250  copies / mL. A negative result does not preclude SARS-CoV-2 infection  and should not be used as the sole basis for treatment or other  patient management decisions.  A negative result may  occur with  improper specimen collection / handling, submission of specimen other  than nasopharyngeal swab, presence of viral mutation(s) within the  areas targeted by this assay, and inadequate number of viral copies  (<250 copies / mL). A negative result must be combined with clinical  observations, patient history, and epidemiological information. If result is POSITIVE SARS-CoV-2 target nucleic acids are DETECTED. The SARS-CoV-2 RNA is generally detectable in upper and lower  respiratory specimens dur ing the acute phase of infection.  Positive  results are indicative of active infection with SARS-CoV-2.  Clinical  correlation with patient history and other diagnostic information is  necessary to determine patient infection status.  Positive results do  not rule out bacterial infection or co-infection with other viruses. If result is PRESUMPTIVE POSTIVE SARS-CoV-2 nucleic acids MAY BE PRESENT.   A presumptive positive result was obtained on the submitted specimen  and confirmed on repeat testing.  While 2019 novel coronavirus  (SARS-CoV-2) nucleic acids may be present in the submitted sample  additional confirmatory testing may be necessary for epidemiological  and / or clinical management purposes  to differentiate between  SARS-CoV-2 and other Sarbecovirus currently known to infect humans.  If clinically indicated additional testing with an alternate test  methodology [phone]) is advised. The SARS-CoV-2 RNA is generally  detectable in upper and lower respiratory sp ecimens during the acute  phase of infection. The expected result is Negative. Fact Sheet for Patients:  StrictlyIdeas.no Fact Sheet for Healthcare Providers: BankingDealers.co.za This test is not  yet approved or cleared by the [place] and has been authorized for detection and/or diagnosis of SARS-CoV-2 by FDA under an Emergency Use Authorization (EUA).  This  EUA will remain in effect (meaning this test can be used) for the duration of the COVID-19 declaration under Section 564(b)(1) of the Act, 21 U.S.C. section 360bbb-3(b)(1), unless the authorization is terminated or revoked sooner. Performed at [facility], [place] [address], [place], [place] [address]   Urinalysis, Routine w reflex microscopic     Status: Abnormal   Collection Time: 09/23/18  3:22 PM  Result Value Ref Range   Color, Urine STRAW (A) YELLOW   APPearance HAZY (A) CLEAR   Specific Gravity, Urine 1.009 1.005 - 1.030   pH 7.0 5.0 - 8.0   Glucose, UA 50 (A) NEGATIVE mg/dL   Hgb urine dipstick NEGATIVE NEGATIVE   Bilirubin Urine NEGATIVE NEGATIVE   Ketones, ur NEGATIVE NEGATIVE mg/dL   Protein, ur >=300 (A) NEGATIVE mg/dL   Nitrite NEGATIVE NEGATIVE   Leukocytes,Ua SMALL (A) NEGATIVE   WBC, UA 11-20 0 - 5 WBC/hpf   Bacteria, UA NONE SEEN NONE SEEN   Mucus PRESENT     Comment: Performed at [facility], [place] [address] [place], [place] [address]  Rapid urine drug screen (hospital performed)     Status: Abnormal   Collection Time: 09/23/18  3:22 PM  Result Value Ref Range   Opiates NONE DETECTED NONE DETECTED   Cocaine POSITIVE (A) NONE DETECTED   Benzodiazepines NONE DETECTED NONE DETECTED   Amphetamines NONE DETECTED NONE DETECTED   Tetrahydrocannabinol POSITIVE (A) NONE DETECTED   Barbiturates NONE DETECTED NONE DETECTED    Comment: (NOTE) DRUG SCREEN FOR MEDICAL PURPOSES ONLY.  IF CONFIRMATION IS NEEDED FOR ANY PURPOSE, NOTIFY LAB WITHIN 5 DAYS. LOWEST DETECTABLE LIMITS FOR URINE DRUG SCREEN Drug Class                     Cutoff (ng/mL) Amphetamine and metabolites    1000 Barbiturate and metabolites    200 Benzodiazepine                 [id number] Tricyclics and metabolites     300 Opiates and metabolites        300 Cocaine and metabolites        300 THC                            50 Performed at [facility],  [place] [address], [place], [place] [address]   Lactic acid, plasma     Status: None   Collection Time: 09/23/18  5:44 PM  Result Value Ref Range   Lactic Acid, Venous 0.9 0.5 - 1.9 mmol/L    Comment: Performed at [facility], [place] [address] [place], [place] [address]   Dg Chest Port 1 View  Result Date: 09/23/2018 CLINICAL DATA:  Chest pain, shortness of breath, history of sickle cell EXAM: PORTABLE CHEST 1 VIEW COMPARISON:  Multiple priors, most recently radiograph July 22, 2018 FINDINGS: Cardiomegaly similar to prior studies. Basilar bronchiectatic changes are present on prior studies. There is increasing opacity in the mesial bases. No pneumothorax. No effusion. Osteonecrosis of the right humeral head and sclerotic endplate changes of the vertebrae similar to prior studies and compatible with patient's known sickle cell disease. IMPRESSION: Increasing opacity in the mesial bases, which could represent developing pneumonia  or atelectasis. Electronically Signed   By: Lovena Le M.D.   On: 09/23/2018 14:28   Ct Renal Stone Study  Addendum Date: 09/23/2018   ADDENDUM REPORT: 09/23/2018 15:25 ADDENDUM: Critical Value/emergent results were called by telephone at the time of interpretation on 09/23/2018 at 3:24 pm to Dr. Pryor Curia , who verbally acknowledged these results. Electronically Signed   By: Lowella Grip III M.D.   On: 09/23/2018 15:25   Addendum Date: 09/23/2018   ADDENDUM REPORT: 09/23/2018 15:20 ADDENDUM: There were technical difficulties regarding the initial dictation. The corrected remainder of the report is as follows: There is an apparent iliacus muscle abscess at the level of the mid right sacrum measuring 7.7 x 6.7 x 3.2 cm. There is bony reactive change in this area. There is apparent reflux of fluid into the distal esophagus. Electronically Signed   By: Lowella Grip III M.D.   On: 09/23/2018 15:20   Result Date: 09/23/2018 CLINICAL DATA:  Right  flank pain.  Sickle cell disease. EXAM: CT ABDOMEN AND PELVIS WITHOUT CONTRAST TECHNIQUE: Multidetector CT imaging of the abdomen and pelvis was performed following the standard protocol without oral or IV contrast. COMPARISON:  April 19, 2017 FINDINGS: Lower chest: There is scarring with bronchiectatic change in the lung bases. There is no lung base edema or consolidation. Hepatobiliary: No focal liver lesions are evident on this noncontrast enhanced study. Gallbladder is absent. There is no appreciable biliary duct dilatation. Pancreas: No pancreatic mass or inflammatory focus. Spleen: Evidence of splenic autoinfarction. Minimal splenic tissue, calcified. Adrenals/Urinary Tract: Adrenals appear normal bilaterally. Kidneys bilaterally show no evident mass or hydronephrosis. There is no evident renal or ureteral calculus on either side. Urinary bladder is distended. Urinary bladder wall thickness is within normal limits. Stomach/Bowel: There is no appreciable bowel wall or mesenteric thickening. No evident bowel obstruction. The terminal ileum appears unremarkable. There is no free air or portal venous air. Vascular/Lymphatic: There is no abdominal aortic aneurysm. No vascular lesions are evident on this noncontrast enhanced study. No adenopathy is appreciable in the abdomen or pelvis. Reproductive: Prostate and seminal vesicles are normal in size and contour. No evident pelvic mass. Other: Appendix appears normal. No abscess or ascites is evident in the abdomen or pelvis. Musculoskeletal: Bony structures show sclerosis indicative of known sickle cell anemia. There is a degree of avascular necrosis in each femoral head without flattening of the femoral heads. No intramuscular or abdominal wall lesions. Scarring is noted in the umbilical region. IMPRESSION: 1. No evident renal or ureteral calculus. No hydronephrosis. Urinary bladder is distended without wall thickening. This degree of distention raises concern for a  degree of potential urinary bladder outlet obstruction. 2. No bowel wall thickening or bowel obstruction. No abscess in the abdomen or pelvis. Appendix appears normal. 3.  Bony changes indicative of sickle cell disease. 4.  Evidence of prior splenic autoinfarction. 5.  Gallbladder absent. 6.  Scarring and bronchiectatic change in the lung bases. 7.  Periumbilical region scarring anteriorly. Electronically Signed: By: Lowella Grip III M.D. On: 09/23/2018 14:45    Pending Labs Unresulted Labs (From admission, onward)    Start     Ordered   09/24/18 0500  CBC with Differential/Platelet  Tomorrow morning,   R    Question:  Specimen collection method  Answer:  IV Team=IV Team collect   09/23/18 1855   09/24/18 0500  Protime-INR  Tomorrow morning,   R    Question:  Specimen collection method  Answer:  IV Team=IV Team collect   09/23/18 1855   09/24/18 0500  Comprehensive metabolic panel  Tomorrow morning,   R    Question:  Specimen collection method  Answer:  IV Team=IV Team collect   09/23/18 1855   09/23/18 1648  Lactic acid, plasma  STAT Now then every 3 hours,   R (with STAT occurrences)    Question:  Specimen collection method  Answer:  IV Team=IV Team collect   09/23/18 1647   09/23/18 1437  Blood culture (routine x 2)  BLOOD CULTURE X 2,   STAT     09/23/18 1437   09/23/18 1302  Urine culture  ONCE - STAT,   STAT     09/23/18 1302   Signed and Held  CBC  Tomorrow morning,   R    Question:  Specimen collection method  Answer:  IV Team=IV Team collect   Signed and Held   Signed and Held  Basic metabolic panel  Tomorrow morning,   R    Question:  Specimen collection method  Answer:  IV Team=IV Team collect   Signed and Held          Vitals/Pain Today's Vitals   09/23/18 1604 09/23/18 1630 09/23/18 1730 09/23/18 1856  BP: 96/73 (!) 108/92 114/86 124/82  Pulse: (!) 105 (!) 109 (!) 109 (!) 109  Resp: (!) 35 17 16 18   Temp:      SpO2: 99% 99% 100% 100%  PainSc:         Isolation Precautions Airborne and Contact precautions  Medications Medications  dextrose 5 %-0.45 % sodium chloride infusion ( Intravenous New Bag/Given 09/23/18 1317)  ondansetron (ZOFRAN) injection 4 mg (4 mg Intravenous Given 09/23/18 1317)  HYDROmorphone (DILAUDID) injection 1-2 mg (has no administration in time range)  piperacillin-tazobactam (ZOSYN) IVPB 3.375 g (has no administration in time range)  vancomycin (VANCOCIN) IVPB 1000 mg/200 mL premix (has no administration in time range)  piperacillin-tazobactam (ZOSYN) IVPB 3.375 g (has no administration in time range)  HYDROmorphone (DILAUDID) injection 2 mg (2 mg Intravenous Given 09/23/18 1317)    Or  HYDROmorphone (DILAUDID) injection 2 mg ( Subcutaneous See Alternative 09/23/18 1317)  HYDROmorphone (DILAUDID) injection 2 mg (2 mg Intravenous Given 09/23/18 1406)    Or  HYDROmorphone (DILAUDID) injection 2 mg ( Subcutaneous See Alternative 09/23/18 1406)  HYDROmorphone (DILAUDID) injection 2 mg (2 mg Intravenous Given 09/23/18 1459)    Or  HYDROmorphone (DILAUDID) injection 2 mg ( Subcutaneous See Alternative 09/23/18 1459)  cefTRIAXone (ROCEPHIN) 1 g in sodium chloride 0.9 % 100 mL IVPB (0 g Intravenous Stopped 09/23/18 1635)  azithromycin (ZITHROMAX) 500 mg in sodium chloride 0.9 % 250 mL IVPB (0 mg Intravenous Stopped 09/23/18 1615)  vancomycin (VANCOCIN) IVPB 1000 mg/200 mL premix (0 mg Intravenous Stopped 09/23/18 1735)    Mobility walks

## 2018-09-23 NOTE — Progress Notes (Signed)
Referring Physician(s): Ward,K  Supervising Physician: Markus Daft  Patient Status:  [name] - In-pt  Chief Complaint:  Right lower abdominal/pelvic pain  Subjective: Patient familiar to IR service from right IJ PICC placement in 2018, random left renal core biopsy in 2019.  He has a history of sickle cell disease, chronic kidney disease, opioid dependence, migraines, GERD and bacterial pneumonia as well as prior splenectomy.  He presented to ED today with worsening right lower quadrant/pelvic discomfort.  CT scan revealed apparent iliacus muscle abscess at the level of the mid right sacrum measuring up to 7.7 cm.  Request now received for image guided drainage of the abscess.  Patient currently denies fever, headache, chest pain, worsening dyspnea, cough, nausea, vomiting or bleeding.  He is COVID-19 negative.  Urine drug screen was positive for cocaine and marijuana.  WBC 22, hemoglobin 7.1, creatinine 2.46.   Past Medical History:  Diagnosis Date  . Arachnoid Cyst of brain bilaterally    "2 really small ones in the back of my head; inside; saw them w/MRI" (09/25/2012)  . Bacterial pneumonia ~ 2012   "caught it here in the hospital" (09/25/2012)  . Chronic kidney disease    "from my sickle cell" (09/25/2012)  . CKD (chronic kidney disease), stage II   . GERD (gastroesophageal reflux disease)    "after I eat alot of spicey foods" (09/25/2012)  . Gynecomastia, male 07/10/2012  . History of blood transfusion    "always related to sickle cell crisis" (09/25/2012)  . 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   Noted in noted from Hematology notes at [facility]  . Migraines    "take RX qd to prevent them" (09/25/2012)  . Opioid dependence with withdrawal ([place]) 08/30/2012  . Sickle cell anemia (HCC)   . Sickle cell crisis ([place]) 09/25/2012  . Sickle cell nephropathy ([place]) 07/10/2012  . Sinus tachycardia   . Tachycardia with heart rate 121-140 beats per minute with ambulation 08/04/2016    Past Surgical History:  Procedure Laterality Date  . CHOLECYSTECTOMY  ~ 2012  . COLONOSCOPY N/A 04/23/2017   Procedure: COLONOSCOPY;  Surgeon: Irene Shipper, MD;  Location: [facility];  Service: Endoscopy;  Laterality: N/A;  . IR FLUORO GUIDE CV LINE RIGHT  12/17/2016  . IR REMOVAL TUN CV CATH W/O FL  12/21/2016  . IR US GUIDE VASC ACCESS RIGHT  12/17/2016  . spleenectomy        Allergies: Nsaids and Morphine and related  Medications: Prior to Admission medications   Medication Sig Start Date End Date Taking? Authorizing Provider  cholecalciferol (VITAMIN D3) 25 MCG (1000 UT) tablet Take 2,000 Units by mouth daily.   Yes [provider]  [facility] VITAMIN B12 1000 MCG tablet Take 2,000 mcg by mouth daily. 06/21/18  Yes [provider]  folic acid (FOLVITE) 1 MG tablet Take 1 tablet (1 mg total) daily by mouth. 12/22/16  Yes Leana Gamer, MD  hydroxyurea (HYDREA) 500 MG capsule TAKE 3 CAPSULES BY MOUTH DAILY Patient taking differently: Take 1,500 mg by mouth daily.  06/19/18  Yes Lanae Boast, FNP  nortriptyline (PAMELOR) 25 MG capsule Take 1 capsule (25 mg total) by mouth at bedtime as needed for sleep. 06/10/18  Yes Donnamae Jude, MD  oxyCODONE (OXYCONTIN) 15 mg 12 hr tablet Take 1 tablet (15 mg total) by mouth every 12 (twelve) hours. 08/30/18  Yes [name], FNP  Oxycodone HCl 10 MG TABS Take 1 tablet (10 mg total) by mouth  every 6 (six) hours as needed. Patient taking differently: Take 10 mg by mouth every 6 (six) hours as needed (pain).  08/30/18  Yes [name], FNP  predniSONE (DELTASONE) 20 MG tablet Take 20 mg by mouth 3 (three) times daily.  06/26/18  Yes [provider]     Vital Signs: BP 114/86   Pulse (!) 109   Temp 99.1 F (37.3 C)   Resp 16   SpO2 100%   Physical Exam awake, slightly drowsy, answers questions appropriately.  Chest clear to auscultation bilaterally.  Heart with tachycardic but regular rhythm.  Abdomen soft,  positive bowel sounds, tender right lower abdominal region to palpation.  No lower extremity edema.  Imaging: Dg Chest Port 1 View  Result Date: 09/23/2018 CLINICAL DATA:  Chest pain, shortness of breath, history of sickle cell EXAM: PORTABLE CHEST 1 VIEW COMPARISON:  Multiple priors, most recently radiograph July 22, 2018 FINDINGS: Cardiomegaly similar to prior studies. Basilar bronchiectatic changes are present on prior studies. There is increasing opacity in the mesial bases. No pneumothorax. No effusion. Osteonecrosis of the right humeral head and sclerotic endplate changes of the vertebrae similar to prior studies and compatible with patient's known sickle cell disease. IMPRESSION: Increasing opacity in the mesial bases, which could represent developing pneumonia or atelectasis. Electronically Signed   By: Lovena Le M.D.   On: 09/23/2018 14:28   Ct Renal Stone Study  Addendum Date: 09/23/2018   ADDENDUM REPORT: 09/23/2018 15:25 ADDENDUM: Critical Value/emergent results were called by telephone at the time of interpretation on 09/23/2018 at 3:24 pm to Dr. Pryor Curia , who verbally acknowledged these results. Electronically Signed   By: Lowella Grip III M.D.   On: 09/23/2018 15:25   Addendum Date: 09/23/2018   ADDENDUM REPORT: 09/23/2018 15:20 ADDENDUM: There were technical difficulties regarding the initial dictation. The corrected remainder of the report is as follows: There is an apparent iliacus muscle abscess at the level of the mid right sacrum measuring 7.7 x 6.7 x 3.2 cm. There is bony reactive change in this area. There is apparent reflux of fluid into the distal esophagus. Electronically Signed   By: Lowella Grip III M.D.   On: 09/23/2018 15:20   Result Date: 09/23/2018 CLINICAL DATA:  Right flank pain.  Sickle cell disease. EXAM: CT ABDOMEN AND PELVIS WITHOUT CONTRAST TECHNIQUE: Multidetector CT imaging of the abdomen and pelvis was performed following the standard protocol  without oral or IV contrast. COMPARISON:  April 19, 2017 FINDINGS: Lower chest: There is scarring with bronchiectatic change in the lung bases. There is no lung base edema or consolidation. Hepatobiliary: No focal liver lesions are evident on this noncontrast enhanced study. Gallbladder is absent. There is no appreciable biliary duct dilatation. Pancreas: No pancreatic mass or inflammatory focus. Spleen: Evidence of splenic autoinfarction. Minimal splenic tissue, calcified. Adrenals/Urinary Tract: Adrenals appear normal bilaterally. Kidneys bilaterally show no evident mass or hydronephrosis. There is no evident renal or ureteral calculus on either side. Urinary bladder is distended. Urinary bladder wall thickness is within normal limits. Stomach/Bowel: There is no appreciable bowel wall or mesenteric thickening. No evident bowel obstruction. The terminal ileum appears unremarkable. There is no free air or portal venous air. Vascular/Lymphatic: There is no abdominal aortic aneurysm. No vascular lesions are evident on this noncontrast enhanced study. No adenopathy is appreciable in the abdomen or pelvis. Reproductive: Prostate and seminal vesicles are normal in size and contour. No evident pelvic mass. Other: Appendix appears normal. No abscess  or ascites is evident in the abdomen or pelvis. Musculoskeletal: Bony structures show sclerosis indicative of known sickle cell anemia. There is a degree of avascular necrosis in each femoral head without flattening of the femoral heads. No intramuscular or abdominal wall lesions. Scarring is noted in the umbilical region. IMPRESSION: 1. No evident renal or ureteral calculus. No hydronephrosis. Urinary bladder is distended without wall thickening. This degree of distention raises concern for a degree of potential urinary bladder outlet obstruction. 2. No bowel wall thickening or bowel obstruction. No abscess in the abdomen or pelvis. Appendix appears normal. 3.  Bony changes  indicative of sickle cell disease. 4.  Evidence of prior splenic autoinfarction. 5.  Gallbladder absent. 6.  Scarring and bronchiectatic change in the lung bases. 7.  Periumbilical region scarring anteriorly. Electronically Signed: By: Lowella Grip III M.D. On: 09/23/2018 14:45    Labs:  CBC: Recent Labs    08/20/18 1020 08/21/18 1327 08/27/18 1535 09/23/18 1140  WBC 9.5 10.1 10.6 22.0*  HGB 7.5* 8.3* 8.2* 7.1*  HCT 22.2* 25.8* 24.2* 22.2*  PLT PLATELET CLUMPS NOTED ON SMEAR, UNABLE TO ESTIMATE 151 238 306    COAGS: Recent Labs    03/07/18 0329  INR 1.04  APTT 34    BMP: Recent Labs    08/20/18 1020 08/21/18 1327 08/27/18 1535 09/23/18 1140  NA 139 135 139 137  K 4.6 4.2 4.8 4.2  CL 111 109 108* 111  CO2 21* 18* 16* 16*  GLUCOSE 149* 128* 93 96  BUN 39* 37* 43* 39*  CALCIUM 8.8* 8.9 8.0* 8.5*  CREATININE 2.31* 2.21* 2.42* 2.46*  GFRNONAA 35* 37* 33* 33*  GFRAA 41* 43* 39* 38*    LIVER FUNCTION TESTS: Recent Labs    08/19/18 0048 08/19/18 0643 08/27/18 1535 09/23/18 1140  BILITOT 0.8 1.2 0.8 0.7  AST 33 34 34 20  ALT 63* 63* 45* 26  ALKPHOS 207* 215* 184* 138*  PROT 5.3* 5.4* 5.0* 6.6  ALBUMIN 2.6* 2.7* 3.0* 2.9*    Assessment and Plan:  Pt with history of sickle cell disease, chronic kidney disease, opioid dependence, migraines, GERD and bacterial pneumonia as well as prior splenectomy.  He presented to ED today with worsening right lower quadrant/pelvic discomfort.  CT scan revealed apparent iliacus muscle abscess at the level of the mid right sacrum measuring up to 7.7 cm.  Request now received for image guided drainage of the abscess.  Imaging studies were reviewed by Dr. Laurence Ferrari.Risks and benefits discussed with the patient including bleeding, infection, damage to adjacent structures, bowel perforation/fistula connection, and sepsis.  All of the patient's questions were answered, patient is agreeable to proceed. Consent signed and in chart.   Procedure scheduled for 8/11    Electronically Signed: D. Rowe Robert, PA-C 09/23/2018, 6:45 PM   I spent a total of 30 minutes at the the patient's bedside AND on the patient's hospital floor or unit, greater than 50% of which was counseling/coordinating care for image guided drainage of right iliacus muscle abscess    Patient ID: [name] [name]., male   DOB: [birth year], 38 y.o.   MRN: [id number]

## 2018-09-23 NOTE — ED Notes (Signed)
Blood cultures collected by phlebotomy

## 2018-09-23 NOTE — ED Provider Notes (Signed)
TIME SEEN: 1:00 PM  CHIEF COMPLAINT: Right flank pain, chest pain, shortness of breath  HPI: Patient is a 38 year old male with history of sickle cell, chronic kidney disease secondary to immune complex glomerulonephritis who presents to the emergency department with complaints of right-sided flank pain that started yesterday and radiates down into his right lower abdomen and groin.  He has never had pain like this before and does not feel like this is his typical sickle cell crisis.  He feels like this pain however is triggering a sickle cell crisis with discomfort all over.  He does have some chest tightness and shortness of breath.  No fevers, cough, vomiting, diarrhea.  No history of PE or DVT.  No history of kidney stones.  Denies dysuria.  Has had a cholecystectomy.  No other abdominal surgeries.  ROS: See HPI Constitutional: no fever  Eyes: no drainage  ENT: no runny nose   Cardiovascular:   chest pain  Resp: SOB  GI: no vomiting GU: no dysuria Integumentary: no rash  Allergy: no hives  Musculoskeletal: no leg swelling  Neurological: no slurred speech ROS otherwise negative  PAST MEDICAL HISTORY/PAST SURGICAL HISTORY:  Past Medical History:  Diagnosis Date  . Arachnoid Cyst of brain bilaterally    "2 really small ones in the back of my head; inside; saw them w/MRI" (09/25/2012)  . Bacterial pneumonia ~ 2012   "caught it here in the hospital" (09/25/2012)  . Chronic kidney disease    "from my sickle cell" (09/25/2012)  . CKD (chronic kidney disease), stage II   . GERD (gastroesophageal reflux disease)    "after I eat alot of spicey foods" (09/25/2012)  . Gynecomastia, male 07/10/2012  . History of blood transfusion    "always related to sickle cell crisis" (09/25/2012)  . 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 Migraines    "take RX qd to prevent them" (09/25/2012)  . Opioid dependence with withdrawal ([place])  08/30/2012  . Sickle cell anemia (HCC)   . Sickle cell crisis ([place]) 09/25/2012  . Sickle cell nephropathy ([place]) 07/10/2012  . Sinus tachycardia   . Tachycardia with heart rate 121-140 beats per minute with ambulation 08/04/2016    MEDICATIONS:  Prior to Admission medications   Medication Sig Start Date End Date Taking? Authorizing Provider  cholecalciferol (VITAMIN D3) 25 MCG (1000 UT) tablet Take 2,000 Units by mouth daily.    [provider]  [facility]S VITAMIN B12 1000 MCG tablet Take 2,000 mcg by mouth daily. 06/21/18   [provider]  ergocalciferol (DRISDOL) 1.25 MG (50000 UT) capsule Take 1 capsule (50,000 Units total) by mouth once a week. Patient not taking: Reported on 08/18/2018 04/05/18   Lanae Boast, FNP  folic acid (FOLVITE) 1 MG tablet Take 1 tablet (1 mg total) daily by mouth. 12/22/16   Leana Gamer, MD  hydroxyurea (HYDREA) 500 MG capsule TAKE 3 CAPSULES BY MOUTH DAILY Patient taking differently: Take 1,500 mg by mouth daily.  06/19/18   Lanae Boast, FNP  nortriptyline (PAMELOR) 25 MG capsule Take 1 capsule (25 mg total) by mouth at bedtime as needed for sleep. 06/10/18   Donnamae Jude, MD  oxyCODONE (OXYCONTIN) 15 mg 12 hr tablet Take 1 tablet (15 mg total) by mouth every 12 (twelve) hours. 08/30/18   [name], FNP  Oxycodone HCl 10 MG TABS Take 1 tablet (10 mg total) by mouth every 6 (six) hours as  needed. 08/30/18   [name], FNP  predniSONE (DELTASONE) 20 MG tablet Take 20 mg by mouth 3 (three) times daily.  06/26/18   [provider]    ALLERGIES:  Allergies  Allergen Reactions  . Nsaids Other (See Comments)    Kidney disease  . Morphine And Related Other (See Comments)    "real bad headaches"    SOCIAL HISTORY:  Social History   Tobacco Use  . Smoking status: Current Some Day Smoker    Packs/day: 0.10    Types: Cigarettes  . Smokeless tobacco: Never Used  . Tobacco comment: 09/25/2012 "I don't buy cigarettes; bum  one from friends q now and then"  Substance Use Topics  . Alcohol use: Yes    Alcohol/week: 0.0 standard drinks    Frequency: Never    Comment: Once a month    FAMILY HISTORY: Family History  Problem Relation Age of Onset  . Breast cancer Mother     EXAM: BP (!) 134/99   Pulse (!) 109   Temp 99.1 F (37.3 C)   Resp 18   SpO2 100%  CONSTITUTIONAL: Alert and oriented and responds appropriately to questions.  Chronically ill-appearing, appears uncomfortable HEAD: Normocephalic EYES: Conjunctivae clear, pupils appear equal, EOMI ENT: normal nose; moist mucous membranes NECK: Supple, no meningismus, no nuchal rigidity, no LAD  CARD: Regular and tachycardic; S1 and S2 appreciated; no murmurs, no clicks, no rubs, no gallops RESP: Normal chest excursion without splinting or tachypnea; breath sounds clear and equal bilaterally; no wheezes, no rhonchi, no rales, no hypoxia or respiratory distress, speaking full sentences ABD/GI: Normal bowel sounds; non-distended; soft, no rebound, no guarding, no peritoneal signs, no hepatosplenomegaly, tender to palpation in the right inguinal area without mass or hernia, 2+ right femoral pulse, no tenderness at McBurney's point BACK:  The back appears normal and is tender to palpation over the right flank, no ecchymosis or swelling, no redness or warmth, no lesions, no midline spinal tenderness or step-off or deformity EXT: Normal ROM in all joints; non-tender to palpation; no edema; normal capillary refill; no cyanosis, no calf tenderness or swelling, 2+ DP pulses bilaterally, full range of motion in the right hip    SKIN: Normal color for age and race; warm; no rash NEURO: Moves all extremities equally PSYCH: The patient's mood and manner are appropriate. Grooming and personal hygiene are appropriate.  MEDICAL DECISION MAKING: Patient here with right-sided flank pain.  Differential includes kidney stone, pyelonephritis, renal infarct, appendicitis,  sickle cell crisis.  Will obtain labs, CT imaging, urine.  Patient also complaining of chest pain or shortness of breath and has had acute chest syndrome before.  Will obtain EKG, chest x-ray.  Will give IV fluids, pain and nausea medicine.  ED PROGRESS: Chest x-ray concerning for bibasilar pneumonia.  Will give Rocephin and azithromycin.  He has a leukocytosis of 22,000 which is new for him.  His hemoglobin appears near baseline at 7.1.   Patient CT scan shows a 7.7 x 6.7 x 3.2 cm iliac is muscle abscess at the level of the mid right sacrum.  Will discuss with interventional radiology.  Will give dose of IV vancomycin for broader coverage.  He denies IV drug abuse.   3:40 Pm  D/w Dr. Geroge Baseman with IR.  Recommend keeping patient n.p.o.  May be able to drain abscess today or early tomorrow.  Will discuss with medicine for admission.   4:47 PM Discussed patient's case with hospitalist, Dr. Jonnie Finner.  I have recommended admission and patient (and family if present) agree with this plan. Admitting physician will place admission orders.   I reviewed all nursing notes, vitals, pertinent previous records, EKGs, lab and urine results, imaging (as available).   Urine drug screen positive for THC and cocaine.   EKG Interpretation  Date/Time:  Monday September 23 2018 14:05:10 EDT Ventricular Rate:  116 PR Interval:    QRS Duration: 98 QT Interval:  314 QTC Calculation: 437 R Axis:   61 Text Interpretation:  Sinus tachycardia Low voltage, extremity and precordial leads No significant change since last tracing other than rate is faster Confirmed by Pryor Curia [phone]) on 09/23/2018 2:08:39 PM        CRITICAL CARE Performed by: Cyril Mourning    Total critical care time: 65 minutes  Critical care time was exclusive of separately billable procedures and treating other patients.  Critical care was necessary to treat or prevent imminent or life-threatening deterioration.  Critical care was  time spent personally by me on the following activities: development of treatment plan with patient and/or surrogate as well as nursing, discussions with consultants, evaluation of patient's response to treatment, examination of patient, obtaining history from patient or surrogate, ordering and performing treatments and interventions, ordering and review of laboratory studies, ordering and review of radiographic studies, pulse oximetry and re-evaluation of patient's condition.    , Delice Bison, DO 09/23/18 [id number]

## 2018-09-24 ENCOUNTER — Inpatient Hospital Stay (HOSPITAL_COMMUNITY): Payer: Medicare Other

## 2018-09-24 DIAGNOSIS — D72829 Elevated white blood cell count, unspecified: Secondary | ICD-10-CM

## 2018-09-24 DIAGNOSIS — K3533 Acute appendicitis with perforation and localized peritonitis, with abscess: Secondary | ICD-10-CM

## 2018-09-24 HISTORY — DX: Acute appendicitis with perforation, localized peritonitis, and gangrene, with abscess: K35.33

## 2018-09-24 LAB — URINE CULTURE: Culture: NO GROWTH

## 2018-09-24 LAB — CBC WITH DIFFERENTIAL/PLATELET
Abs Immature Granulocytes: 0.24 10*3/uL — ABNORMAL HIGH (ref 0.00–0.07)
Basophils Absolute: 0 10*3/uL (ref 0.0–0.1)
Basophils Relative: 0 %
Eosinophils Absolute: 0 10*3/uL (ref 0.0–0.5)
Eosinophils Relative: 0 %
HCT: 22.9 % — ABNORMAL LOW (ref 39.0–52.0)
Hemoglobin: 7.1 g/dL — ABNORMAL LOW (ref 13.0–17.0)
Immature Granulocytes: 1 %
Lymphocytes Relative: 15 %
Lymphs Abs: 3.2 10*3/uL (ref 0.7–4.0)
MCH: 37.6 pg — ABNORMAL HIGH (ref 26.0–34.0)
MCHC: 31 g/dL (ref 30.0–36.0)
MCV: 121.2 fL — ABNORMAL HIGH (ref 80.0–100.0)
Monocytes Absolute: 1.1 10*3/uL — ABNORMAL HIGH (ref 0.1–1.0)
Monocytes Relative: 5 %
Neutro Abs: 17.3 10*3/uL — ABNORMAL HIGH (ref 1.7–7.7)
Neutrophils Relative %: 79 %
Platelets: 325 10*3/uL (ref 150–400)
RBC: 1.89 MIL/uL — ABNORMAL LOW (ref 4.22–5.81)
WBC: 22 10*3/uL — ABNORMAL HIGH (ref 4.0–10.5)
nRBC: 5 % — ABNORMAL HIGH (ref 0.0–0.2)

## 2018-09-24 LAB — COMPREHENSIVE METABOLIC PANEL
ALT: 33 U/L (ref 0–44)
AST: 28 U/L (ref 15–41)
Albumin: 2.7 g/dL — ABNORMAL LOW (ref 3.5–5.0)
Alkaline Phosphatase: 166 U/L — ABNORMAL HIGH (ref 38–126)
Anion gap: 9 (ref 5–15)
BUN: 30 mg/dL — ABNORMAL HIGH (ref 6–20)
CO2: 16 mmol/L — ABNORMAL LOW (ref 22–32)
Calcium: 8.3 mg/dL — ABNORMAL LOW (ref 8.9–10.3)
Chloride: 107 mmol/L (ref 98–111)
Creatinine, Ser: 2.27 mg/dL — ABNORMAL HIGH (ref 0.61–1.24)
GFR calc Af Amer: 42 mL/min — ABNORMAL LOW (ref >60)
GFR calc non Af Amer: 36 mL/min — ABNORMAL LOW (ref >60)
Glucose, Bld: 88 mg/dL (ref 70–99)
Potassium: 4.5 mmol/L (ref 3.5–5.1)
Sodium: 132 mmol/L — ABNORMAL LOW (ref 135–145)
Total Bilirubin: 1 mg/dL (ref 0.3–1.2)
Total Protein: 6.3 g/dL — ABNORMAL LOW (ref 6.5–8.1)

## 2018-09-24 LAB — PROTIME-INR
INR: 1 (ref 0.8–1.2)
Prothrombin Time: 13 seconds (ref 11.4–15.2)

## 2018-09-24 IMAGING — CT CT RENAL STONE PROTOCOL
2 of 4 series · 15 of 46 positions shown, 17 images · non-contrast
Comparison: CT 04/07/2017

CLINICAL DATA: Flank pain. Stone disease suspected. Diarrhea and
abdominal discomfort. Recent hospital admission for colitis. No
improvement, now with weakness.

EXAM:
CT ABDOMEN AND PELVIS WITHOUT CONTRAST
TECHNIQUE: Multidetector CT imaging of the abdomen and pelvis was performed
following the standard protocol without IV contrast.

[Series 2: axial st · axial · 0.70mm/px · z∈[-604,-204]mm · 12 of 92 slices shown, 14 images]
[im 6/92  soft-tissue]
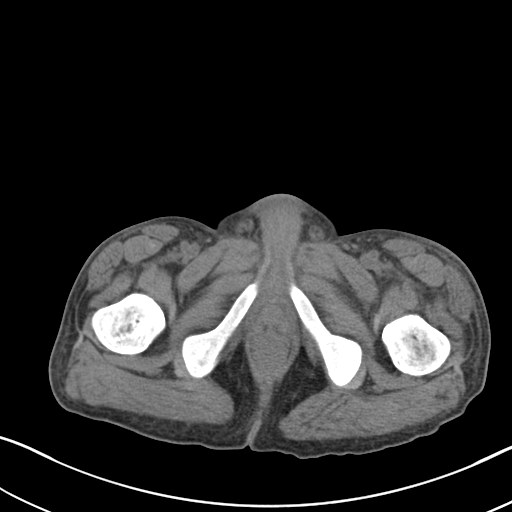
[im 6/92  bone]
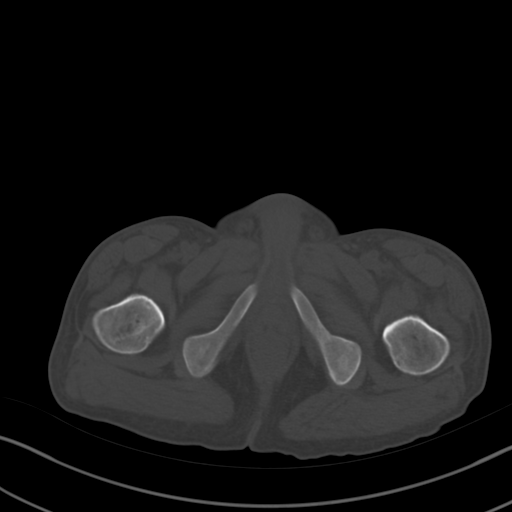
[im 16/92  soft-tissue]
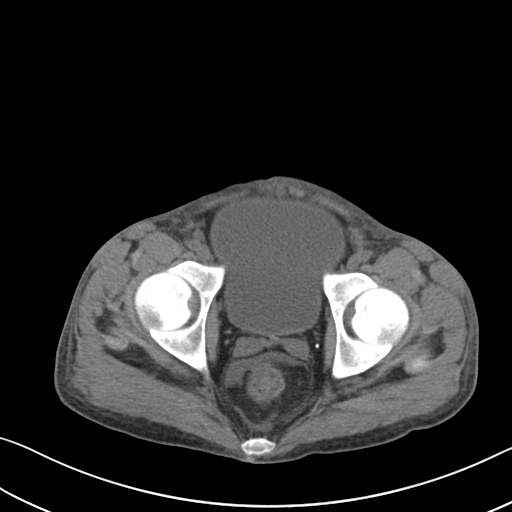
[im 21/92  soft-tissue]
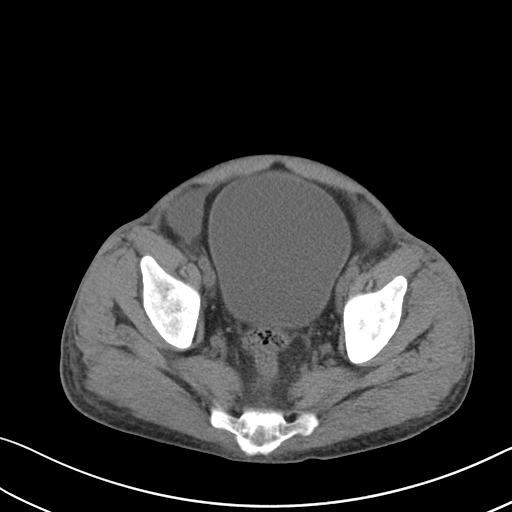
[im 26/92  soft-tissue]
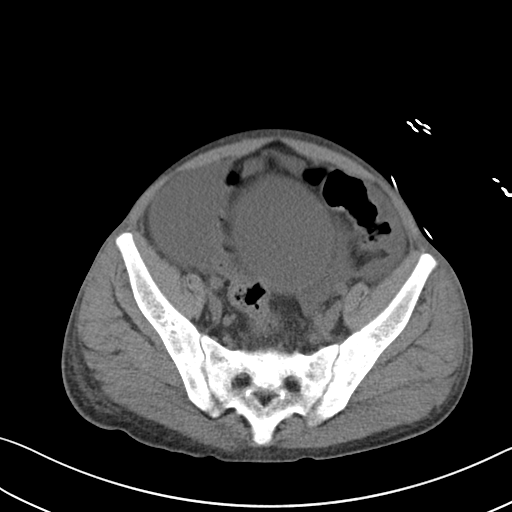
[im 36/92  soft-tissue]
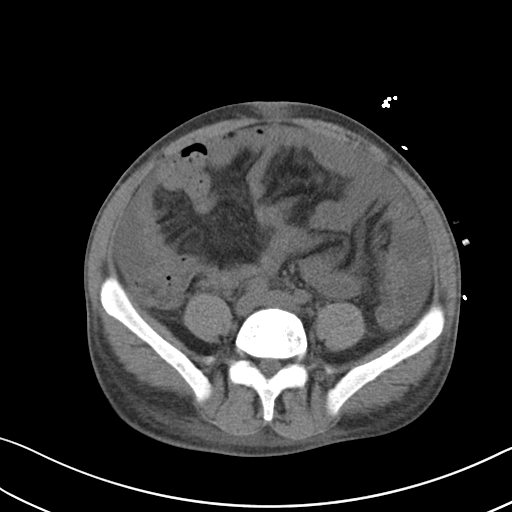
[im 41/92  soft-tissue]
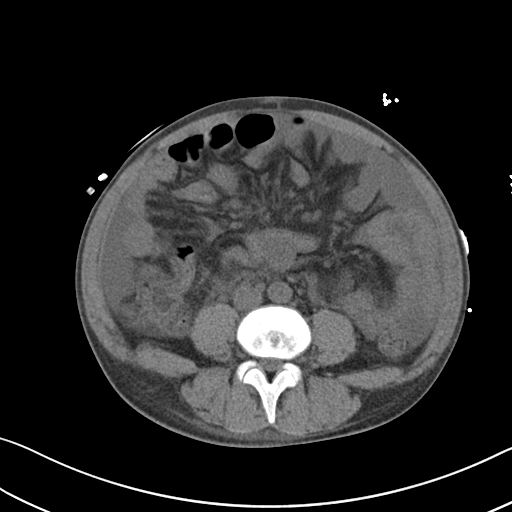
[im 51/92  soft-tissue]
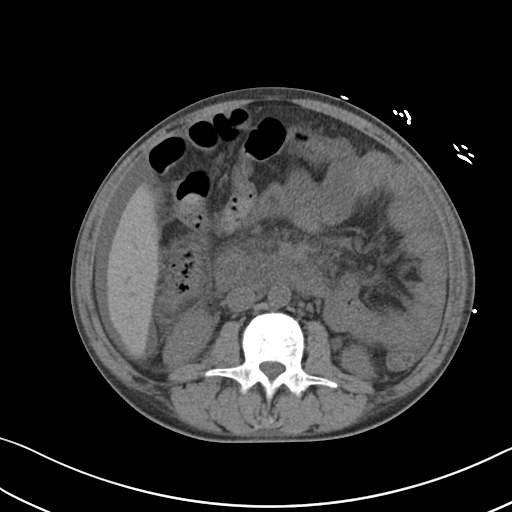
[im 56/92  soft-tissue]
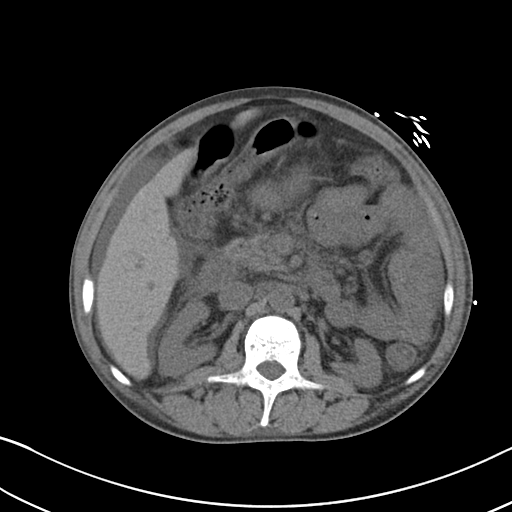
[im 66/92  soft-tissue]
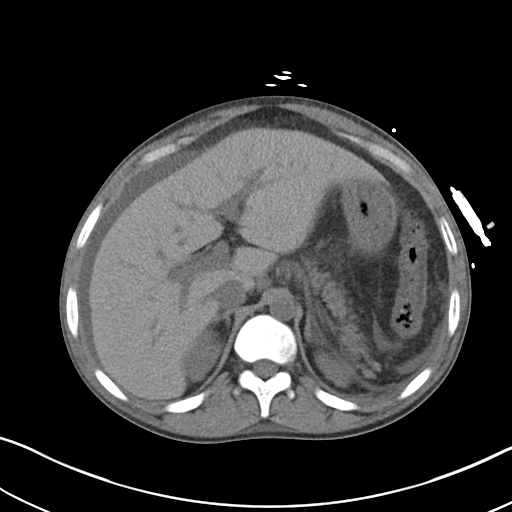
[im 66/92  bone]
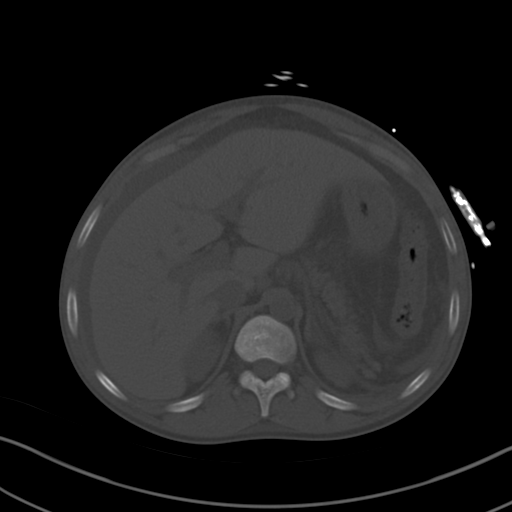
[im 71/92  soft-tissue]
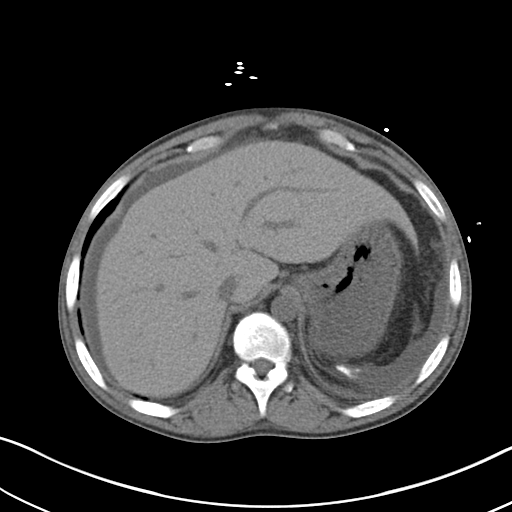
[im 76/92  soft-tissue]
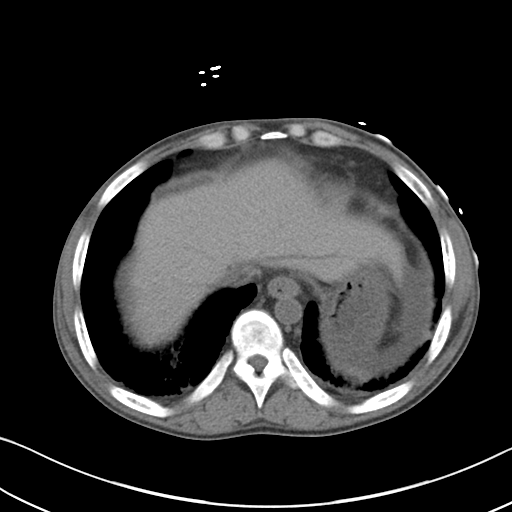
[im 86/92  soft-tissue]
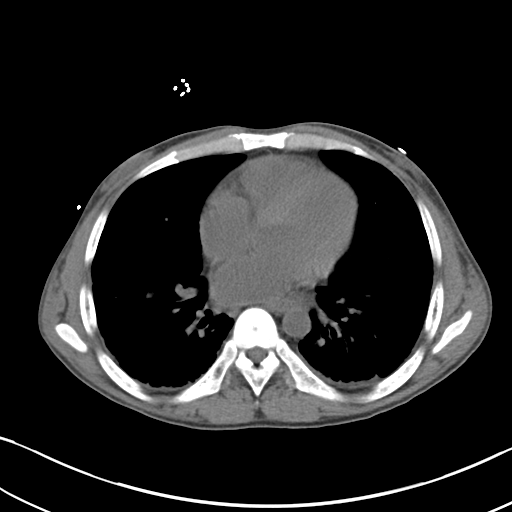

[Series 5: coronal · coronal · 0.63mm/px · 3 of 139 slices shown]
[im 47/139  soft-tissue]
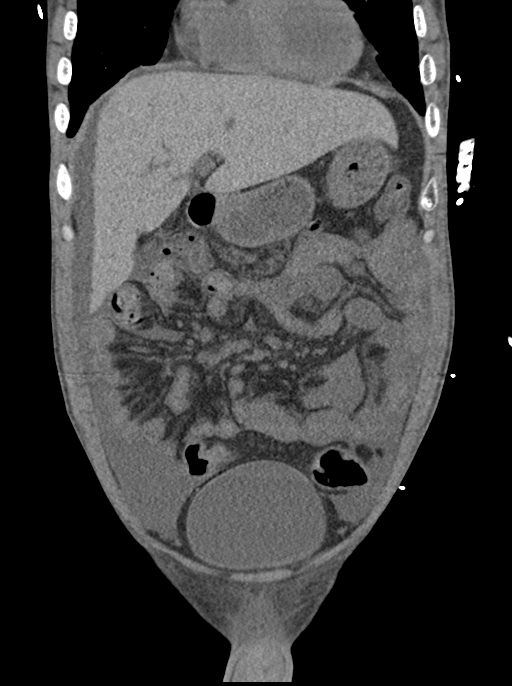
[im 62/139  soft-tissue]
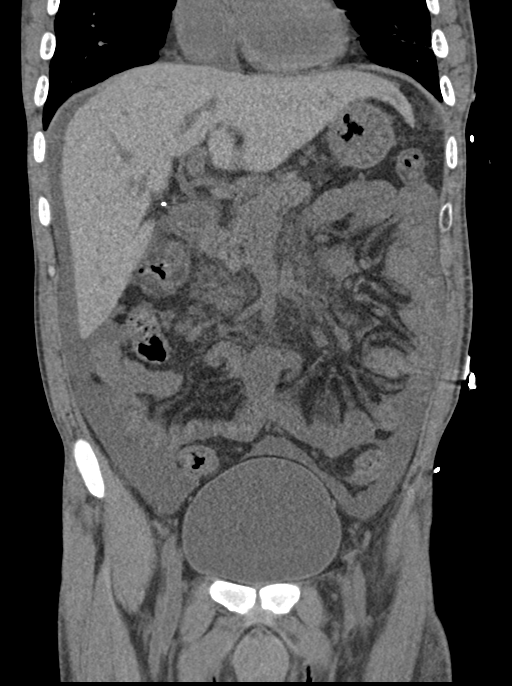
[im 77/139  soft-tissue]
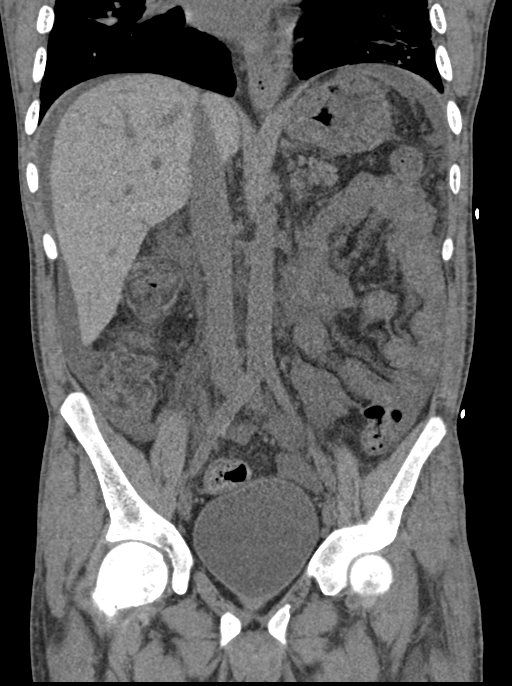

[15 of 46 positions shown; findings below may reference images not displayed]

FINDINGS: Lower chest: Bibasilar scarring with slight improved lung aeration
from prior exam. Trace bilateral pleural thickening. Decreased
density of the blood pool consistent with anemia.

Hepatobiliary: No focal hepatic lesion. Clips in the gallbladder
fossa postcholecystectomy. No biliary dilatation.

Pancreas: No ductal dilatation. No definite peripancreatic fat
stranding, however mild generalized edema of the upper mesentery.

Spleen: Tiny calcified spleen, sequela of sickle cell, unchanged.

Adrenals/Urinary Tract: Normal adrenal glands. Bilateral renal
parenchymal atrophy, unchanged from prior exam. There is prominence
of the left greater than right renal collecting systems and proximal
ureter without calcified stone. Urinary bladder is distended without
wall thickening. No calcified urolithiasis.

Stomach/Bowel: Persistent and slight worsening colonic wall
thickening of the ascending and transverse colon, now with
involvement of the mid and proximal descending colon. No evidence of
perforation. Possible wall thickening of the duodenum, suboptimally
assessed without enteric contrast and presence of intra-abdominal
ascites. No other small bowel inflammation. Normal appendix.

Vascular/Lymphatic: Innumerable central mesenteric nodes that are
not enlarged by size criteria. Decreased blood pool density
consistent with anemia.

Reproductive: Prostate is unremarkable.

Other: Increased abdominopelvic ascites from prior, small to
moderate. Increased mesenteric edema. No free air. No evidence of
intra-abdominal abscess.

Musculoskeletal: Diffuse osteosclerosis consistent with history of
sickle cell. Multiple small endplate depressions throughout spine.
No acute finding.
IMPRESSION: 1. Persistent/worsening colitis with colonic wall thickening of the
ascending and transverse colon, new involvement of the descending
colon. Possible new wall thickening of the duodenum. Findings may be
infectious or inflammatory.
2. Increased abdominopelvic ascites.
3. Prominence of bilateral renal collecting systems without
urolithiasis. This may be secondary to urinary bladder distention.

## 2018-09-24 MED ORDER — MIDAZOLAM HCL 2 MG/2ML IJ SOLN
INTRAMUSCULAR | Status: AC
  Filled 2018-09-24: qty 4

## 2018-09-24 MED ORDER — LIDOCAINE HCL (PF) 1 % IJ SOLN
INTRAMUSCULAR | Status: AC | PRN
  Administered 2018-09-24: 10 mL

## 2018-09-24 MED ORDER — HYDROMORPHONE HCL 1 MG/ML IJ SOLN
0.5000 mg | Freq: Once | INTRAMUSCULAR | Status: AC
  Administered 2018-09-24: 0.5 mg via INTRAVENOUS
  Filled 2018-09-24: qty 0.5

## 2018-09-24 MED ORDER — SODIUM CHLORIDE 0.9% FLUSH
5.0000 mL | Freq: Three times a day (TID) | INTRAVENOUS | Status: DC
  Administered 2018-09-25 – 2018-09-28 (×7): 5 mL

## 2018-09-24 MED ORDER — OXYCODONE HCL 5 MG PO TABS
10.0000 mg | ORAL_TABLET | Freq: Four times a day (QID) | ORAL | Status: DC | PRN
  Administered 2018-09-24 – 2018-09-26 (×5): 10 mg via ORAL
  Filled 2018-09-24 (×6): qty 2

## 2018-09-24 MED ORDER — MIDAZOLAM HCL 2 MG/2ML IJ SOLN
INTRAMUSCULAR | Status: AC | PRN
  Administered 2018-09-24: 1 mg via INTRAVENOUS

## 2018-09-24 MED ORDER — HYDROMORPHONE HCL 1 MG/ML IJ SOLN
2.0000 mg | INTRAMUSCULAR | Status: DC | PRN
  Administered 2018-09-24 – 2018-09-25 (×8): 2 mg via INTRAVENOUS
  Filled 2018-09-24 (×8): qty 2

## 2018-09-24 MED ORDER — FENTANYL CITRATE (PF) 100 MCG/2ML IJ SOLN
INTRAMUSCULAR | Status: AC | PRN
  Administered 2018-09-24 (×2): 50 ug via INTRAVENOUS

## 2018-09-24 MED ORDER — FENTANYL CITRATE (PF) 100 MCG/2ML IJ SOLN
INTRAMUSCULAR | Status: AC
  Filled 2018-09-24: qty 2

## 2018-09-24 NOTE — Progress Notes (Signed)
Patient ID: [name] Pick., male   DOB: [birth year], 38 y.o.   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  Patient is status post CT-guided drainage of iliopsoas abscess by percutaneous catheter, draining yellow purulent fluid into collecting bag, sample sent for culture.   Patient tested positive for cocaine and THC.  He continues to deny using cocaine. Patient has no new complaint today, he said his pain is less since drainage.  He is however asking for more pain medication.  He denies any fever, chest pain, cough, shortness of breath, nausea, vomiting or diarrhea.  Objective:  Vital signs in last 24 hours:  Vitals:   09/24/18 1025 09/24/18 1030 09/24/18 1035 09/24/18 1040  BP: (!) 125/91 128/89 119/84 (!) 123/93  Pulse: (!) 129 (!) 132 (!) 127 (!) 125  Resp: 19 18 18 16   Temp:      TempSrc:      SpO2: 100% 100% 100% 100%    Intake/Output from previous day:   Intake/Output Summary (Last 24 hours) at 09/24/2018 1240 Last data filed at 09/24/2018 0600 Gross per 24 hour  Intake 1463.16 ml  Output 1500 ml  Net -36.84 ml   Physical Exam: General: Alert, awake, oriented x3, in no acute distress.  Chronically ill looking, round moon face HEENT: [place]/AT PEERL, EOMI Neck: Trachea midline,  no masses, no thyromegal,y no JVD, no carotid bruit OROPHARYNX:  Moist, No exudate/ erythema/lesions.  Heart: Regular rate and rhythm, without murmurs, rubs, gallops, PMI non-displaced, no heaves or thrills on palpation.  Lungs: Clear to auscultation, no wheezing or rhonchi noted. No increased vocal fremitus resonant to percussion  Abdomen: Percutaneous right-sided drainage in situ, draining serosanguineous fluid.  Abdomen is otherwise soft,  mildly tender, nondistended, positive bowel sounds, no masses no hepatosplenomegaly noted..  Neuro: No focal neurological deficits noted cranial nerves II through XII grossly intact. DTRs 2+ bilaterally upper and lower extremities. Strength 5 out of 5 in bilateral upper and lower extremities. Musculoskeletal: No warm swelling or erythema around joints, no spinal tenderness noted. Psychiatric: Patient alert and oriented x3, good insight and cognition, good recent to remote recall. Lymph node survey: No cervical axillary or inguinal lymphadenopathy noted.  Lab Results:  Basic Metabolic Panel:    Component Value Date/Time   NA 132 (L) 09/24/2018 0438   NA 139 08/27/2018 1535   K 4.5 09/24/2018 0438   CL 107 09/24/2018 0438   CO2 16 (L) 09/24/2018 0438   BUN 30 (H) 09/24/2018 0438   BUN 43 (H) 08/27/2018 1535   CREATININE 2.27 (H) 09/24/2018 0438   CREATININE 1.45 (H) 05/12/2014 1430   GLUCOSE 88 09/24/2018 0438   CALCIUM 8.3 (L) 09/24/2018 0438   CBC:    Component Value Date/Time   WBC 22.0 (H) 09/24/2018 0438   HGB 7.1 (L) 09/24/2018 0438   HGB 8.2 (L) 08/27/2018 1535   HCT 22.9 (L) 09/24/2018 0438   HCT 24.2 (L) 08/27/2018 1535   PLT 325 09/24/2018 0438   PLT 238 08/27/2018 1535   MCV 121.2 (H) 09/24/2018 0438   MCV 98 (H) 08/27/2018 1535   NEUTROABS 17.3 (H) 09/24/2018 0438   NEUTROABS 8.6 (H) 08/27/2018 1535   LYMPHSABS 3.2 09/24/2018 0438   LYMPHSABS 1.2  08/27/2018 1535   MONOABS 1.1 (H) 09/24/2018 0438   EOSABS 0.0 09/24/2018 0438   EOSABS 0.0 08/27/2018 1535   BASOSABS 0.0 09/24/2018 0438   BASOSABS 0.0 08/27/2018 1535    Recent Results (from the past 240 hour(s))  SARS Coronavirus 2 [facility] order, Performed in [facility] hospital lab) Nasopharyngeal Nasopharyngeal Swab     Status: None   Collection Time: 09/23/18  3:06 PM   Specimen: Nasopharyngeal Swab  Result Value Ref Range Status   SARS Coronavirus 2 NEGATIVE NEGATIVE Final    Comment: (NOTE) If result  is NEGATIVE SARS-CoV-2 target nucleic acids are NOT DETECTED. The SARS-CoV-2 RNA is generally detectable in upper and lower  respiratory specimens during the acute phase of infection. The lowest  concentration of SARS-CoV-2 viral copies this assay can detect is 250  copies / mL. A negative result does not preclude SARS-CoV-2 infection  and should not be used as the sole basis for treatment or other  patient management decisions.  A negative result may occur with  improper specimen collection / handling, submission of specimen other  than nasopharyngeal swab, presence of viral mutation(s) within the  areas targeted by this assay, and inadequate number of viral copies  (<250 copies / mL). A negative result must be combined with clinical  observations, patient history, and epidemiological information. If result is POSITIVE SARS-CoV-2 target nucleic acids are DETECTED. The SARS-CoV-2 RNA is generally detectable in upper and lower  respiratory specimens dur ing the acute phase of infection.  Positive  results are indicative of active infection with SARS-CoV-2.  Clinical  correlation with patient history and other diagnostic information is  necessary to determine patient infection status.  Positive results do  not rule out bacterial infection or co-infection with other viruses. If result is PRESUMPTIVE POSTIVE SARS-CoV-2 nucleic acids MAY BE PRESENT.   A presumptive positive result was obtained on the submitted specimen  and confirmed on repeat testing.  While 2019 novel coronavirus  (SARS-CoV-2) nucleic acids may be present in the submitted sample  additional confirmatory testing may be necessary for epidemiological  and / or clinical management purposes  to differentiate between  SARS-CoV-2 and other Sarbecovirus currently known to infect humans.  If clinically indicated additional testing with an alternate test  methodology [phone]) is advised. The SARS-CoV-2 RNA is generally   detectable in upper and lower respiratory sp ecimens during the acute  phase of infection. The expected result is Negative. Fact Sheet for Patients:  BoilerBrush.com.cy Fact Sheet for Healthcare Providers: https://pope.com/ This test is not yet approved or cleared by the [place] FDA and has been authorized for detection and/or diagnosis of SARS-CoV-2 by FDA under an Emergency Use Authorization (EUA).  This EUA will remain in effect (meaning this test can be used) for the duration of the COVID-19 declaration under Section 564(b)(1) of the Act, 21 U.S.C. section 360bbb-3(b)(1), unless the authorization is terminated or revoked sooner. Performed at [facility], [address], [place], [place] [address]     Studies/Results: Dg Chest Port 1 View  Result Date: 09/23/2018 CLINICAL DATA:  Chest pain, shortness of breath, history of sickle cell EXAM: PORTABLE CHEST 1 VIEW COMPARISON:  Multiple priors, most recently radiograph July 22, 2018 FINDINGS: Cardiomegaly similar to prior studies. Basilar bronchiectatic changes are present on prior studies. There is increasing opacity in the mesial bases. No pneumothorax. No effusion. Osteonecrosis of the right humeral head and sclerotic endplate changes of the vertebrae similar to prior studies  and compatible with patient's known sickle cell disease. IMPRESSION: Increasing opacity in the mesial bases, which could represent developing pneumonia or atelectasis. Electronically Signed   By: Kreg Shropshire M.D.   On: 09/23/2018 14:28   Ct Renal Stone Study  Addendum Date: 09/23/2018   ADDENDUM REPORT: 09/23/2018 15:25 ADDENDUM: Critical Value/emergent results were called by telephone at the time of interpretation on 09/23/2018 at 3:24 pm to Dr. Rochele Raring , who verbally acknowledged these results. Electronically Signed   By: Bretta Bang III M.D.   On: 09/23/2018 15:25   Addendum  Date: 09/23/2018   ADDENDUM REPORT: 09/23/2018 15:20 ADDENDUM: There were technical difficulties regarding the initial dictation. The corrected remainder of the report is as follows: There is an apparent iliacus muscle abscess at the level of the mid right sacrum measuring 7.7 x 6.7 x 3.2 cm. There is bony reactive change in this area. There is apparent reflux of fluid into the distal esophagus. Electronically Signed   By: Bretta Bang III M.D.   On: 09/23/2018 15:20   Result Date: 09/23/2018 CLINICAL DATA:  Right flank pain.  Sickle cell disease. EXAM: CT ABDOMEN AND PELVIS WITHOUT CONTRAST TECHNIQUE: Multidetector CT imaging of the abdomen and pelvis was performed following the standard protocol without oral or IV contrast. COMPARISON:  April 19, 2017 FINDINGS: Lower chest: There is scarring with bronchiectatic change in the lung bases. There is no lung base edema or consolidation. Hepatobiliary: No focal liver lesions are evident on this noncontrast enhanced study. Gallbladder is absent. There is no appreciable biliary duct dilatation. Pancreas: No pancreatic mass or inflammatory focus. Spleen: Evidence of splenic autoinfarction. Minimal splenic tissue, calcified. Adrenals/Urinary Tract: Adrenals appear normal bilaterally. Kidneys bilaterally show no evident mass or hydronephrosis. There is no evident renal or ureteral calculus on either side. Urinary bladder is distended. Urinary bladder wall thickness is within normal limits. Stomach/Bowel: There is no appreciable bowel wall or mesenteric thickening. No evident bowel obstruction. The terminal ileum appears unremarkable. There is no free air or portal venous air. Vascular/Lymphatic: There is no abdominal aortic aneurysm. No vascular lesions are evident on this noncontrast enhanced study. No adenopathy is appreciable in the abdomen or pelvis. Reproductive: Prostate and seminal vesicles are normal in size and contour. No evident pelvic mass. Other: Appendix  appears normal. No abscess or ascites is evident in the abdomen or pelvis. Musculoskeletal: Bony structures show sclerosis indicative of known sickle cell anemia. There is a degree of avascular necrosis in each femoral head without flattening of the femoral heads. No intramuscular or abdominal wall lesions. Scarring is noted in the umbilical region. IMPRESSION: 1. No evident renal or ureteral calculus. No hydronephrosis. Urinary bladder is distended without wall thickening. This degree of distention raises concern for a degree of potential urinary bladder outlet obstruction. 2. No bowel wall thickening or bowel obstruction. No abscess in the abdomen or pelvis. Appendix appears normal. 3.  Bony changes indicative of sickle cell disease. 4.  Evidence of prior splenic autoinfarction. 5.  Gallbladder absent. 6.  Scarring and bronchiectatic change in the lung bases. 7.  Periumbilical region scarring anteriorly. Electronically Signed: By: Bretta Bang III M.D. On: 09/23/2018 14:45    Medications: Scheduled Meds: . cholecalciferol  2,000 Units Oral Daily  . fentaNYL      . folic acid  1 mg Oral Daily  . heparin  5,000 Units Subcutaneous Q8H  . midazolam      . oxyCODONE  15 mg Oral Q12H  .  predniSONE  20 mg Oral BID WC  . cyanocobalamin  2,000 mcg Oral Daily   Continuous Infusions: . sodium chloride 75 mL/hr at 09/24/18 1216  . piperacillin-tazobactam (ZOSYN)  IV    . [START ON 09/25/2018] vancomycin     PRN Meds:.acetaminophen **OR** acetaminophen, bisacodyl, HYDROmorphone (DILAUDID) injection, nortriptyline, ondansetron, Oxycodone HCl, polyethylene glycol  Consultants:  Interventional radiology  Procedures:  CT-guided percutaneous iliopsoas abscess drainage  Antibiotics:  IV vancomycin and IV Zosyn  Assessment/Plan: Principal Problem:   Iliopsoas abscess on right [facility]) Active Problems:   CKD (chronic kidney disease), stage IV (HCC)   Tachycardia   Leukocytosis   Nephrotic  syndrome   Chronic pain syndrome   Acute right-sided low back pain   Iliopsoas abscess (HCC)  1. Iliopsoas abscess: Status post CT-guided percutaneous drainage, IV vancomycin and Zosyn.  Continue.  Follow-up culture 2. Hb Sickle Cell Disease with crisis: Continue IVF D5 .45% Saline @ 75 mls/hour, patient tested positive for cocaine, per policy he should not really see any narcotics but because of the procedure and attending pain, will oblige IV Dilaudid 1 to 2 mg every 3-4 hours as needed for pain, IV Toradol is contraindicated because of CKD/nephrotic syndrome.  Start patient on his home pain medications orally.  Monitor vitals very closely, Re-evaluate pain scale regularly, 2 L of Oxygen by [place]. 3. Leukocytosis: Multifactorial, from abscess to sickle cell crisis.  Patient is on IV antibiotics broad-spectrum.  We will continue.  Follow-up all cultures. 4. Sickle Cell Anemia: Fortunately his hemoglobin is staying around baseline, no immediate clinical indication for blood transfusion.  Will monitor H&H. 5. Chronic pain Syndrome: Home medications. 6. Cocaine use disorder: Patient counseled extensively again.  Will wean off IV Dilaudid as soon as pain subsides a little.  Code Status: Full Code Family Communication: N/A Disposition Plan: Not yet ready for discharge  Keiara Sneeringer  If 7PM-7AM, please contact night-coverage.  09/24/2018, 12:40 PM  LOS: 1 day
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
## 2018-09-24 NOTE — Procedures (Signed)
Interventional Radiology Procedure:   Indications: Right iliacus abscess  Procedure: CT guided drain placement  Findings: 10.2 Fr drain placed and yellow purulent fluid removed.  Complications: None     EBL: less than 10 ml  Plan: Send fluid for culture.  Follow output.     Raizy Auzenne R. Anselm Pancoast, MD  Pager: [phone]

## 2018-09-25 LAB — CBC WITH DIFFERENTIAL/PLATELET
Abs Immature Granulocytes: 0.14 10*3/uL — ABNORMAL HIGH (ref 0.00–0.07)
Basophils Absolute: 0 10*3/uL (ref 0.0–0.1)
Basophils Relative: 0 %
Eosinophils Absolute: 0 10*3/uL (ref 0.0–0.5)
Eosinophils Relative: 0 %
HCT: 26.1 % — ABNORMAL LOW (ref 39.0–52.0)
Hemoglobin: 8.7 g/dL — ABNORMAL LOW (ref 13.0–17.0)
Immature Granulocytes: 1 %
Lymphocytes Relative: 2 %
Lymphs Abs: 0.4 10*3/uL — ABNORMAL LOW (ref 0.7–4.0)
MCH: 37.8 pg — ABNORMAL HIGH (ref 26.0–34.0)
MCHC: 33.3 g/dL (ref 30.0–36.0)
MCV: 113.5 fL — ABNORMAL HIGH (ref 80.0–100.0)
Monocytes Absolute: 0.4 10*3/uL (ref 0.1–1.0)
Monocytes Relative: 3 %
Neutro Abs: 15.1 10*3/uL — ABNORMAL HIGH (ref 1.7–7.7)
Neutrophils Relative %: 94 %
Platelets: 255 10*3/uL (ref 150–400)
RBC: 2.3 MIL/uL — ABNORMAL LOW (ref 4.22–5.81)
RDW: 28.9 % — ABNORMAL HIGH (ref 11.5–15.5)
WBC: 16.1 10*3/uL — ABNORMAL HIGH (ref 4.0–10.5)
nRBC: 5.8 % — ABNORMAL HIGH (ref 0.0–0.2)

## 2018-09-25 LAB — CREATININE, SERUM
Creatinine, Ser: 2.75 mg/dL — ABNORMAL HIGH (ref 0.61–1.24)
GFR calc Af Amer: 33 mL/min — ABNORMAL LOW (ref >60)
GFR calc non Af Amer: 29 mL/min — ABNORMAL LOW (ref >60)

## 2018-09-25 MED ORDER — SODIUM CHLORIDE 0.9% FLUSH: 10.0000 mL | INTRAVENOUS | Status: DC | PRN

## 2018-09-25 MED ORDER — SODIUM CHLORIDE 0.9% FLUSH
10.0000 mL | Freq: Two times a day (BID) | INTRAVENOUS | Status: DC
  Administered 2018-09-26 – 2018-09-27 (×3): 10 mL

## 2018-09-25 MED ORDER — CYCLOBENZAPRINE HCL 5 MG PO TABS
5.0000 mg | ORAL_TABLET | Freq: Once | ORAL | Status: AC
  Administered 2018-09-25: 5 mg via ORAL
  Filled 2018-09-25: qty 1

## 2018-09-25 MED ORDER — HYDROMORPHONE HCL 1 MG/ML IJ SOLN
1.0000 mg | INTRAMUSCULAR | Status: DC | PRN
  Administered 2018-09-25 – 2018-09-26 (×5): 1 mg via INTRAVENOUS
  Filled 2018-09-25 (×5): qty 1

## 2018-09-25 MED ORDER — SODIUM CHLORIDE 0.9 % IV SOLN
2.0000 g | INTRAVENOUS | Status: DC
  Administered 2018-09-25 – 2018-09-27 (×3): 2 g via INTRAVENOUS
  Filled 2018-09-25 (×4): qty 2

## 2018-09-25 NOTE — Progress Notes (Signed)
Referring Physician(s): Ward,K/Schertz,R  Supervising Physician: Aletta Edouard  Patient Status:  [facility] - In-pt  Chief Complaint: Right lower quadrant/pelvic pain   Subjective: Patient feeling better since right lower quadrant drain placed yesterday.  Denies nausea, vomiting or respiratory issues; some mild soreness at right lower quadrant drain insertion site as expected.   Allergies: Nsaids and Morphine and related  Medications: Prior to Admission medications   Medication Sig Start Date End Date Taking? Authorizing Provider  cholecalciferol (VITAMIN D3) 25 MCG (1000 UT) tablet Take 2,000 Units by mouth daily.   Yes [provider]  [facility] VITAMIN B12 1000 MCG tablet Take 2,000 mcg by mouth daily. 06/21/18  Yes [provider]  folic acid (FOLVITE) 1 MG tablet Take 1 tablet (1 mg total) daily by mouth. 12/22/16  Yes Leana Gamer, MD  hydroxyurea (HYDREA) 500 MG capsule TAKE 3 CAPSULES BY MOUTH DAILY Patient taking differently: Take 1,500 mg by mouth daily.  06/19/18  Yes Lanae Boast, FNP  nortriptyline (PAMELOR) 25 MG capsule Take 1 capsule (25 mg total) by mouth at bedtime as needed for sleep. 06/10/18  Yes Donnamae Jude, MD  oxyCODONE (OXYCONTIN) 15 mg 12 hr tablet Take 1 tablet (15 mg total) by mouth every 12 (twelve) hours. 08/30/18  Yes [name], FNP  Oxycodone HCl 10 MG TABS Take 1 tablet (10 mg total) by mouth every 6 (six) hours as needed. Patient taking differently: Take 10 mg by mouth every 6 (six) hours as needed (pain).  08/30/18  Yes [name], FNP  predniSONE (DELTASONE) 20 MG tablet Take 20 mg by mouth 3 (three) times daily.  06/26/18  Yes [provider]     Vital Signs: BP 132/88 (BP Location: Left Arm)   Pulse (!) 110   Temp 98.6 F (37 C) (Oral)   Resp 18   Wt 119 lb 0.8 oz (54 kg)   SpO2 99%   BMI 21.09 kg/m   Physical Exam awake, alert.  Right lower quadrant drain intact, insertion site okay, mildly  tender, output 230 cc of slightly turbid serosanguineous fluid  Imaging: Dg Chest Port 1 View  Result Date: 09/23/2018 CLINICAL DATA:  Chest pain, shortness of breath, history of sickle cell EXAM: PORTABLE CHEST 1 VIEW COMPARISON:  Multiple priors, most recently radiograph July 22, 2018 FINDINGS: Cardiomegaly similar to prior studies. Basilar bronchiectatic changes are present on prior studies. There is increasing opacity in the mesial bases. No pneumothorax. No effusion. Osteonecrosis of the right humeral head and sclerotic endplate changes of the vertebrae similar to prior studies and compatible with patient's known sickle cell disease. IMPRESSION: Increasing opacity in the mesial bases, which could represent developing pneumonia or atelectasis. Electronically Signed   By: Lovena Le M.D.   On: 09/23/2018 14:28   Ct Renal Stone Study  Addendum Date: 09/23/2018   ADDENDUM REPORT: 09/23/2018 15:25 ADDENDUM: Critical Value/emergent results were called by telephone at the time of interpretation on 09/23/2018 at 3:24 pm to Dr. Pryor Curia , who verbally acknowledged these results. Electronically Signed   By: Lowella Grip III M.D.   On: 09/23/2018 15:25   Addendum Date: 09/23/2018   ADDENDUM REPORT: 09/23/2018 15:20 ADDENDUM: There were technical difficulties regarding the initial dictation. The corrected remainder of the report is as follows: There is an apparent iliacus muscle abscess at the level of the mid right sacrum measuring 7.7 x 6.7 x 3.2 cm. There is bony reactive change in this area. There  is apparent reflux of fluid into the distal esophagus. Electronically Signed   By: Lowella Grip III M.D.   On: 09/23/2018 15:20   Result Date: 09/23/2018 CLINICAL DATA:  Right flank pain.  Sickle cell disease. EXAM: CT ABDOMEN AND PELVIS WITHOUT CONTRAST TECHNIQUE: Multidetector CT imaging of the abdomen and pelvis was performed following the standard protocol without oral or IV contrast.  COMPARISON:  April 19, 2017 FINDINGS: Lower chest: There is scarring with bronchiectatic change in the lung bases. There is no lung base edema or consolidation. Hepatobiliary: No focal liver lesions are evident on this noncontrast enhanced study. Gallbladder is absent. There is no appreciable biliary duct dilatation. Pancreas: No pancreatic mass or inflammatory focus. Spleen: Evidence of splenic autoinfarction. Minimal splenic tissue, calcified. Adrenals/Urinary Tract: Adrenals appear normal bilaterally. Kidneys bilaterally show no evident mass or hydronephrosis. There is no evident renal or ureteral calculus on either side. Urinary bladder is distended. Urinary bladder wall thickness is within normal limits. Stomach/Bowel: There is no appreciable bowel wall or mesenteric thickening. No evident bowel obstruction. The terminal ileum appears unremarkable. There is no free air or portal venous air. Vascular/Lymphatic: There is no abdominal aortic aneurysm. No vascular lesions are evident on this noncontrast enhanced study. No adenopathy is appreciable in the abdomen or pelvis. Reproductive: Prostate and seminal vesicles are normal in size and contour. No evident pelvic mass. Other: Appendix appears normal. No abscess or ascites is evident in the abdomen or pelvis. Musculoskeletal: Bony structures show sclerosis indicative of known sickle cell anemia. There is a degree of avascular necrosis in each femoral head without flattening of the femoral heads. No intramuscular or abdominal wall lesions. Scarring is noted in the umbilical region. IMPRESSION: 1. No evident renal or ureteral calculus. No hydronephrosis. Urinary bladder is distended without wall thickening. This degree of distention raises concern for a degree of potential urinary bladder outlet obstruction. 2. No bowel wall thickening or bowel obstruction. No abscess in the abdomen or pelvis. Appendix appears normal. 3.  Bony changes indicative of sickle cell  disease. 4.  Evidence of prior splenic autoinfarction. 5.  Gallbladder absent. 6.  Scarring and bronchiectatic change in the lung bases. 7.  Periumbilical region scarring anteriorly. Electronically Signed: By: Lowella Grip III M.D. On: 09/23/2018 14:45   Ct Image Guided Drainage By Percutaneous Catheter  Result Date: 09/24/2018 INDICATION: 38 year old with fluid collection involving the right iliacus/iliopsoas muscle. Concern for abscess. EXAM: CT GUIDED DRAINAGE OF RIGHT ILIOPSOAS ABSCESS MEDICATIONS: Moderate sedation ANESTHESIA/SEDATION: 2.0 mg IV Versed 100 mcg IV Fentanyl Moderate Sedation Time:  20 minutes The patient was continuously monitored during the procedure by the interventional radiology nurse under my direct supervision. COMPLICATIONS: None immediate. TECHNIQUE: Informed written consent was obtained from the patient after a thorough discussion of the procedural risks, benefits and alternatives. All questions were addressed. A timeout was performed prior to the initiation of the procedure. PROCEDURE: Supine images of the lower abdomen were obtained with CT. The fluid collection involving the right iliacus and right iliopsoas were targeted. Right side of the abdomen was prepped with chlorhexidine and sterile field was created. Skin and soft tissues were anesthetized with 1% lidocaine. Using CT guidance, 18 gauge trocar needle was directed into the fluid collection. Yellow purulent fluid was aspirated. Stiff Amplatz wire was advanced into the collection and the tract was dilated to accommodate a 10.2 [place] drain. Fluid was collected for culture. Catheter was attached to a suction bulb and sutured to skin. Dressing was placed.  FINDINGS: Again noted is a low-density collection involving the right iliacus and right iliopsoas muscle. Yellow purulent fluid was removed from the collection and compatible with an abscess. Drain placed within the iliopsoas abscess. IMPRESSION: CT-guided placement of a  drainage catheter within the right iliopsoas abscess. Electronically Signed   By: Markus Daft M.D.   On: 09/24/2018 12:44    Labs:  CBC: Recent Labs    08/27/18 1535 09/23/18 1140 09/24/18 0438 09/25/18 0531  WBC 10.6 22.0* 22.0* 16.1*  HGB 8.2* 7.1* 7.1* 8.7*  HCT 24.2* 22.2* 22.9* 26.1*  PLT 238 306 325 255    COAGS: Recent Labs    03/07/18 0329 09/24/18 0438  INR 1.04 1.0  APTT 34  --     BMP: Recent Labs    08/21/18 1327 08/27/18 1535 09/23/18 1140 09/24/18 0438 09/25/18 0531  NA 135 139 137 132*  --   K 4.2 4.8 4.2 4.5  --   CL 109 108* 111 107  --   CO2 18* 16* 16* 16*  --   GLUCOSE 128* 93 96 88  --   BUN 37* 43* 39* 30*  --   CALCIUM 8.9 8.0* 8.5* 8.3*  --   CREATININE 2.21* 2.42* 2.46* 2.27* 2.75*  GFRNONAA 37* 33* 33* 36* 29*  GFRAA 43* 39* 38* 42* 33*    LIVER FUNCTION TESTS: Recent Labs    08/19/18 0643 08/27/18 1535 09/23/18 1140 09/24/18 0438  BILITOT 1.2 0.8 0.7 1.0  AST 34 34 20 28  ALT 63* 45* 26 33  ALKPHOS 215* 184* 138* 166*  PROT 5.4* 5.0* 6.6 6.3*  ALBUMIN 2.7* 3.0* 2.9* 2.7*    Assessment and Plan: Patient with history of sickle cell disease, chronic kidney disease, opioid dependence, GERD, prior splenectomy, recent worsening right lower quadrant pain and imaging findings of fluid collection in right iliacus /iliopsoas muscle; status post drain placement on 8/11; afebrile, WBC 16.1, down from 22, hemoglobin 8.7 up from 7.1, creatinine 2.75 up from 2.27, urine culture negative, drain fluid and blood cultures negative to date; continue current treatment/drain output monitoring and lab monitoring.  Check final cultures.  Once output from drain minimal obtain follow-up CT.   Electronically Signed: D. Rowe Robert, PA-C 09/25/2018, 11:57 AM   I spent a total of 20 minutes at the the patient's bedside AND on the patient's hospital floor or unit, greater than 50% of which was counseling/coordinating care for right lower  abdominal/pelvic fluid collection drain    Patient ID: [name] Meyer., male   DOB: August 06, 1980, 38 y.o.   MRN: [id number]

## 2018-09-25 NOTE — Progress Notes (Signed)
Patient ID: [name] Pick., male   DOB: April 24, 1980, 38 y.o.   MRN: [id number] Subjective: [name] Crummie. is a 38 y.o. male with hx of sickle cell diseas, CKD IV, opioid dependence, migraines, GERD, and hx bacterial PNA , h/o splenectomy presents to ED with R lower back pain who was admitted yesterday for iliopsoas abscess and sickle cell pain crisis.  Patient is status post CT-guided drainage of  abscess day 2.   Patient has no new complaint today, is slowly getting better.  He rates his pain at 7/10 mostly around insertion of catheter site, lower back and lower extremities.  He denies any fever, chest pain, cough, shortness of breath, nausea, vomiting or diarrhea.  Objective:  Vital signs in last 24 hours:  Vitals:   09/24/18 2028 09/25/18 0631 09/25/18 1029 09/25/18 1512  BP: (!) 137/91 132/88  124/76  Pulse: (!) 105 (!) 110  (!) 102  Resp: 16 18  18   Temp: 98.3 F (36.8 C) 98.6 F (37 C)  98.1 F (36.7 C)  TempSrc: Oral Oral  Oral  SpO2: 99% 99%  99%  Weight:   54 kg     Intake/Output from previous day:   Intake/Output Summary (Last 24 hours) at 09/25/2018 1737 Last data filed at 09/25/2018 1536 Gross per 24 hour  Intake 2553.37 ml  Output 2175 ml  Net 378.37 ml   Physical Exam: General: Alert, awake, oriented x3, in no acute distress.  Chronically ill looking, round moon face HEENT: [place]/AT PEERL, EOMI Neck: Trachea midline,  no masses, no thyromegal,y no JVD, no carotid bruit OROPHARYNX:  Moist, No exudate/ erythema/lesions.  Heart: Regular rate and rhythm, without murmurs, rubs, gallops, PMI non-displaced, no heaves or thrills on palpation.  Lungs: Clear to auscultation, no wheezing or rhonchi noted. No increased vocal fremitus resonant to percussion  Abdomen: Percutaneous right-sided drainage in situ, draining serosanguineous fluid.  Abdomen is otherwise soft, mildly tender, nondistended, positive bowel sounds, no masses no hepatosplenomegaly noted..   Neuro: No focal neurological deficits noted cranial nerves II through XII grossly intact. DTRs 2+ bilaterally upper and lower extremities. Strength 5 out of 5 in bilateral upper and lower extremities. Musculoskeletal: No warm swelling or erythema around joints, no spinal tenderness noted. Psychiatric: Patient alert and oriented x3, good insight and cognition, good recent to remote recall. Lymph node survey: No cervical axillary or inguinal lymphadenopathy noted.  Lab Results:  Basic Metabolic Panel:    Component Value Date/Time   NA 132 (L) 09/24/2018 0438   NA 139 08/27/2018 1535   K 4.5 09/24/2018 0438   CL 107 09/24/2018 0438   CO2 16 (L) 09/24/2018 0438   BUN 30 (H) 09/24/2018 0438   BUN 43 (H) 08/27/2018 1535   CREATININE 2.75 (H) 09/25/2018 0531   CREATININE 1.45 (H) 05/12/2014 1430   GLUCOSE 88 09/24/2018 0438   CALCIUM 8.3 (L) 09/24/2018 0438   CBC:    Component Value Date/Time   WBC 16.1 (H) 09/25/2018 0531   HGB 8.7 (L) 09/25/2018 0531   HGB 8.2 (L) 08/27/2018 1535   HCT 26.1 (L) 09/25/2018 0531   HCT 24.2 (L) 08/27/2018 1535   PLT 255 09/25/2018 0531   PLT 238 08/27/2018 1535   MCV 113.5 (H) 09/25/2018 0531   MCV 98 (H) 08/27/2018 1535   NEUTROABS 15.1 (H) 09/25/2018 0531   NEUTROABS 8.6 (H) 08/27/2018 1535   LYMPHSABS 0.4 (L) 09/25/2018 0531   LYMPHSABS 1.2 08/27/2018 1535   MONOABS 0.4  09/25/2018 0531   EOSABS 0.0 09/25/2018 0531   EOSABS 0.0 08/27/2018 1535   BASOSABS 0.0 09/25/2018 0531   BASOSABS 0.0 08/27/2018 1535    Recent Results (from the past 240 hour(s))  Blood culture (routine x 2)     Status: None (Preliminary result)   Collection Time: 09/23/18  2:58 PM   Specimen: BLOOD  Result Value Ref Range Status   Specimen Description   Final    BLOOD LEFT ARM Performed at [facility], [address], [place], [place] [address]    Special Requests   Final    BOTTLES DRAWN AEROBIC ONLY Blood Culture adequate volume Performed  at [facility], [address] [place], [place] [address]    Culture   Final    NO GROWTH 2 DAYS Performed at [facility], [address], [place], [place] [address]    Report Status PENDING  Incomplete  Blood culture (routine x 2)     Status: None (Preliminary result)   Collection Time: 09/23/18  2:58 PM   Specimen: BLOOD  Result Value Ref Range Status   Specimen Description   Final    BLOOD LEFT ARM Performed at [facility], [address], [place], [place] [address]    Special Requests   Final    BOTTLES DRAWN AEROBIC ONLY Blood Culture adequate volume Performed at [facility], [address] [place], [place] [address]    Culture   Final    NO GROWTH 2 DAYS Performed at [facility], [address] [place], [place] [address]    Report Status PENDING  Incomplete  SARS Coronavirus 2 [name] order, Performed in [facility] hospital lab) Nasopharyngeal Nasopharyngeal Swab     Status: None   Collection Time: 09/23/18  3:06 PM   Specimen: Nasopharyngeal Swab  Result Value Ref Range Status   SARS Coronavirus 2 NEGATIVE NEGATIVE Final    Comment: (NOTE) If result is NEGATIVE SARS-CoV-2 target nucleic acids are NOT DETECTED. The SARS-CoV-2 RNA is generally detectable in upper and lower  respiratory specimens during the acute phase of infection. The lowest  concentration of SARS-CoV-2 viral copies this assay can detect is 250  copies / mL. A negative result does not preclude SARS-CoV-2 infection  and should not be used as the sole basis for treatment or other  patient management decisions.  A negative result may occur with  improper specimen collection / handling, submission of specimen other  than nasopharyngeal swab, presence of viral mutation(s) within the  areas targeted by this assay, and inadequate number of viral copies  (<250 copies / mL). A negative result must be combined with clinical   observations, patient history, and epidemiological information. If result is POSITIVE SARS-CoV-2 target nucleic acids are DETECTED. The SARS-CoV-2 RNA is generally detectable in upper and lower  respiratory specimens dur ing the acute phase of infection.  Positive  results are indicative of active infection with SARS-CoV-2.  Clinical  correlation with patient history and other diagnostic information is  necessary to determine patient infection status.  Positive results do  not rule out bacterial infection or co-infection with other viruses. If result is PRESUMPTIVE POSTIVE SARS-CoV-2 nucleic acids MAY BE PRESENT.   A presumptive positive result was obtained on the submitted specimen  and confirmed on repeat testing.  While 2019 novel coronavirus  (SARS-CoV-2) nucleic acids may be present in the submitted sample  additional confirmatory testing may be  necessary for epidemiological  and / or clinical management purposes  to differentiate between  SARS-CoV-2 and other Sarbecovirus currently known to infect humans.  If clinically indicated additional testing with an alternate test  methodology [phone]) is advised. The SARS-CoV-2 RNA is generally  detectable in upper and lower respiratory sp ecimens during the acute  phase of infection. The expected result is Negative. Fact Sheet for Patients:  BoilerBrush.com.cy Fact Sheet for Healthcare Providers: https://pope.com/ This test is not yet approved or cleared by the [place] FDA and has been authorized for detection and/or diagnosis of SARS-CoV-2 by FDA under an Emergency Use Authorization (EUA).  This EUA will remain in effect (meaning this test can be used) for the duration of the COVID-19 declaration under Section 564(b)(1) of the Act, 21 U.S.C. section 360bbb-3(b)(1), unless the authorization is terminated or revoked sooner. Performed at [facility], [address], [place], [place] [address]   Urine culture     Status: None   Collection Time: 09/23/18  3:22 PM   Specimen: Urine, Random  Result Value Ref Range Status   Specimen Description   Final    URINE, RANDOM Performed at [facility], [address], [place], [place] [address]    Special Requests   Final    NONE Performed at [facility], [address] [place], [place] [address]    Culture   Final    NO GROWTH Performed at [facility], [address], [place], [place] [address]    Report Status 09/24/2018 FINAL  Final  Aerobic/Anaerobic Culture (surgical/deep wound)     Status: None (Preliminary result)   Collection Time: 09/24/18 10:50 AM   Specimen: Abscess  Result Value Ref Range Status   Specimen Description   Final    ABSCESS RT ILIAC FOSSA Performed at [facility], [address] [place], [place] [address]    Special Requests   Final    NONE Performed at [facility], [address] [place], [place] [address]    Gram Stain   Final    ABUNDANT WBC PRESENT, PREDOMINANTLY PMN MODERATE GRAM NEGATIVE RODS    Culture   Final    NO GROWTH < 24 HOURS Performed at [facility], [address], [place], [place] [address]    Report Status PENDING  Incomplete    Studies/Results: Ct Image Guided Drainage By Percutaneous Catheter  Result Date: 09/24/2018 INDICATION: 38 year old with fluid collection involving the right iliacus/iliopsoas muscle. Concern for abscess. EXAM: CT GUIDED DRAINAGE OF RIGHT ILIOPSOAS ABSCESS MEDICATIONS: Moderate sedation ANESTHESIA/SEDATION: 2.0 mg IV Versed 100 mcg IV Fentanyl Moderate Sedation Time:  20 minutes The patient was continuously monitored during the procedure by the interventional radiology nurse under my direct supervision. COMPLICATIONS: None immediate. TECHNIQUE: Informed written consent was obtained from the patient after a thorough  discussion of the procedural risks, benefits and alternatives. All questions were addressed. A timeout was performed prior to the initiation of the procedure. PROCEDURE: Supine images of the lower abdomen were obtained with CT. The fluid collection involving the right iliacus and right iliopsoas were targeted. Right side of the abdomen was prepped with chlorhexidine and sterile field was created. Skin and soft tissues were anesthetized with 1% lidocaine. Using CT guidance, 18 gauge trocar needle was directed into the fluid collection. Yellow purulent fluid was aspirated. Stiff Amplatz wire was advanced into the collection and the tract was dilated to accommodate  a 10.2 [place] drain. Fluid was collected for culture. Catheter was attached to a suction bulb and sutured to skin. Dressing was placed. FINDINGS: Again noted is a low-density collection involving the right iliacus and right iliopsoas muscle. Yellow purulent fluid was removed from the collection and compatible with an abscess. Drain placed within the iliopsoas abscess. IMPRESSION: CT-guided placement of a drainage catheter within the right iliopsoas abscess. Electronically Signed   By: Richarda Overlie M.D.   On: 09/24/2018 12:44    Medications: Scheduled Meds: . cholecalciferol  2,000 Units Oral Daily  . folic acid  1 mg Oral Daily  . heparin  5,000 Units Subcutaneous Q8H  . oxyCODONE  15 mg Oral Q12H  . predniSONE  20 mg Oral BID WC  . sodium chloride flush  10-40 mL Intracatheter Q12H  . sodium chloride flush  5 mL Intracatheter Q8H  . cyanocobalamin  2,000 mcg Oral Daily   Continuous Infusions: . sodium chloride Stopped (09/25/18 1238)  . ceFEPime (MAXIPIME) IV     PRN Meds:.acetaminophen **OR** acetaminophen, bisacodyl, HYDROmorphone (DILAUDID) injection, nortriptyline, ondansetron, oxyCODONE, polyethylene glycol, sodium chloride flush  Consultants:  Interventional radiology  Procedures:  CT-guided percutaneous iliopsoas abscess  drainage  Antibiotics:  IV cefepime  Assessment/Plan: Principal Problem:   Iliopsoas abscess on right (HCC) Active Problems:   CKD (chronic kidney disease), stage IV (HCC)   Tachycardia   Leukocytosis   Nephrotic syndrome   Chronic pain syndrome   Acute right-sided low back pain   Iliopsoas abscess (HCC)   Abscess of right iliac fossa  1. Iliopsoas abscess: Status post CT-guided percutaneous drainage.  Culture is growing gram-negative rods.  Will discontinue IV vancomycin and replace IV Zosyn with IV cefepime. Follow-up culture and sensitivity. 2. Hb Sickle Cell Disease with crisis: Continue IVF D5 .45% Saline @ 75 mls/hour, continue IV Dilaudid 1 to 2 mg every 3-4 hours as needed for pain, IV Toradol is contraindicated because of CKD/nephrotic syndrome.  Continue home pain medications orally.  Monitor vitals very closely, Re-evaluate pain scale regularly, 2 L of Oxygen by [place]. 3. Leukocytosis: Multifactorial, from abscess and sickle cell crisis  Continue IV antibiotics.  Follow-up all cultures and sensitivity. 4. Sickle Cell Anemia: Hemoglobin continues to stay around baseline, no immediate clinical indication for blood transfusion. Will monitor H&H. 5. Chronic pain Syndrome: Home medications. 6. Cocaine use disorder: Patient counseled extensively again.  Will wean off IV Dilaudid as soon as pain subsides a little.  Code Status: Full Code Family Communication: N/A Disposition Plan: Not yet ready for discharge  Ines Warf  If 7PM-7AM, please contact night-coverage.  09/25/2018, 5:37 PM  LOS: 2 days

## 2018-09-26 DIAGNOSIS — K6812 Psoas muscle abscess: Principal | ICD-10-CM

## 2018-09-26 DIAGNOSIS — M60009 Infective myositis, unspecified site: Secondary | ICD-10-CM

## 2018-09-26 DIAGNOSIS — M545 Low back pain: Secondary | ICD-10-CM

## 2018-09-26 DIAGNOSIS — G894 Chronic pain syndrome: Secondary | ICD-10-CM

## 2018-09-26 DIAGNOSIS — R079 Chest pain, unspecified: Secondary | ICD-10-CM

## 2018-09-26 DIAGNOSIS — D5700 Hb-SS disease with crisis, unspecified: Secondary | ICD-10-CM

## 2018-09-26 DIAGNOSIS — K3533 Acute appendicitis with perforation and localized peritonitis, with abscess: Secondary | ICD-10-CM

## 2018-09-26 LAB — CREATININE, SERUM
Creatinine, Ser: 3.46 mg/dL — ABNORMAL HIGH (ref 0.61–1.24)
GFR calc Af Amer: 25 mL/min — ABNORMAL LOW (ref >60)
GFR calc non Af Amer: 22 mL/min — ABNORMAL LOW (ref >60)

## 2018-09-26 MED ORDER — OXYCODONE HCL 5 MG PO TABS
10.0000 mg | ORAL_TABLET | ORAL | Status: DC
  Administered 2018-09-26 – 2018-09-28 (×11): 10 mg via ORAL
  Filled 2018-09-26 (×11): qty 2

## 2018-09-26 MED ORDER — HYDROMORPHONE HCL 1 MG/ML IJ SOLN
1.0000 mg | Freq: Four times a day (QID) | INTRAMUSCULAR | Status: DC | PRN
  Administered 2018-09-26 – 2018-09-28 (×5): 1 mg via INTRAVENOUS
  Filled 2018-09-26 (×6): qty 1

## 2018-09-26 NOTE — Care Management Important Message (Signed)
Important Message  Patient Details IM Letter given to Sharren Bridge SW to present to the Patient Name: [name] [name]. MRN: [id number] Date of Birth: 1981/01/01   Medicare Important Message Given:  Yes     Kerin Salen 09/26/2018, 10:17 AM

## 2018-09-26 NOTE — Progress Notes (Cosign Needed)
Subjective: [name] [name], a 38 year old male with a medical history significant for sickle cell disease, chronic kidney disease stage IV, opiate dependence, history of migraine headaches, history of GERD, history of cocaine use was admitted for iliopsoas abscess and sickle cell pain crisis.  Patient is status post CT-guided drainage of abscess day 3.  Patient continues to complain of increased pain to lower back.  Urine drug screen positive for cocaine, but patient continued on opiates due to pain localized at abscess drain insertion site.  Pain intensity 7/10 primarily to low back and lower extremities. Patient denies chest pain, dizziness, paresthesias, persistent cough, nausea, vomiting, or diarrhea.  Objective:  Vital signs in last 24 hours:  Vitals:   09/25/18 1029 09/25/18 1512 09/25/18 2012 09/26/18 0427  BP:  124/76 (!) 148/103 138/68  Pulse:  (!) 102 77 (!) 101  Resp:  18 18 18   Temp:  98.1 F (36.7 C) 98.3 F (36.8 C) 97.8 F (36.6 C)  TempSrc:  Oral Oral Oral  SpO2:  99% 96% 100%  Weight: 54 kg   55.7 kg    Intake/Output from previous day:   Intake/Output Summary (Last 24 hours) at 09/26/2018 1347 Last data filed at 09/26/2018 1035 Gross per 24 hour  Intake 1365.94 ml  Output 2745 ml  Net -1379.06 ml    Physical Exam: General: Alert, awake, oriented x3, in no acute distress.  HEENT: [place]/AT PEERL, EOMI Neck: Trachea midline,  no masses, no thyromegal,y no JVD, no carotid bruit OROPHARYNX:  Moist, No exudate/ erythema/lesions.  Heart: Regular rate and rhythm, without murmurs, rubs, gallops, PMI non-displaced, no heaves or thrills on palpation.  Lungs: Clear to auscultation, no wheezing or rhonchi noted. No increased vocal fremitus resonant to percussion  Abdomen: Soft, nontender, nondistended, positive bowel sounds, no masses no hepatosplenomegaly noted..  Neuro: No focal neurological deficits noted cranial nerves II through XII grossly intact. DTRs 2+ bilaterally  upper and lower extremities. Strength 5 out of 5 in bilateral upper and lower extremities. Musculoskeletal: No warm swelling or erythema around joints, no spinal tenderness noted. Psychiatric: Patient alert and oriented x3, good insight and cognition, good recent to remote recall. Lymph node survey: No cervical axillary or inguinal lymphadenopathy noted. Skin: Drain to right buttock.  Dressing clean dry and intact.  Appears to be draining appropriately Lab Results:  Basic Metabolic Panel:    Component Value Date/Time   NA 132 (L) 09/24/2018 0438   NA 139 08/27/2018 1535   K 4.5 09/24/2018 0438   CL 107 09/24/2018 0438   CO2 16 (L) 09/24/2018 0438   BUN 30 (H) 09/24/2018 0438   BUN 43 (H) 08/27/2018 1535   CREATININE 3.46 (H) 09/26/2018 0349   CREATININE 1.45 (H) 05/12/2014 1430   GLUCOSE 88 09/24/2018 0438   CALCIUM 8.3 (L) 09/24/2018 0438   CBC:    Component Value Date/Time   WBC 16.1 (H) 09/25/2018 0531   HGB 8.7 (L) 09/25/2018 0531   HGB 8.2 (L) 08/27/2018 1535   HCT 26.1 (L) 09/25/2018 0531   HCT 24.2 (L) 08/27/2018 1535   PLT 255 09/25/2018 0531   PLT 238 08/27/2018 1535   MCV 113.5 (H) 09/25/2018 0531   MCV 98 (H) 08/27/2018 1535   NEUTROABS 15.1 (H) 09/25/2018 0531   NEUTROABS 8.6 (H) 08/27/2018 1535   LYMPHSABS 0.4 (L) 09/25/2018 0531   LYMPHSABS 1.2 08/27/2018 1535   MONOABS 0.4 09/25/2018 0531   EOSABS 0.0 09/25/2018 0531   EOSABS 0.0 08/27/2018 1535  BASOSABS 0.0 09/25/2018 0531   BASOSABS 0.0 08/27/2018 1535    Recent Results (from the past 240 hour(s))  Blood culture (routine x 2)     Status: None (Preliminary result)   Collection Time: 09/23/18  2:58 PM   Specimen: BLOOD  Result Value Ref Range Status   Specimen Description   Final    BLOOD LEFT ARM Performed at [facility] [address] [place], [place] [address]    Special Requests   Final    BOTTLES DRAWN AEROBIC ONLY Blood Culture adequate volume Performed at [facility] [address], [place], [place] [address]    Culture   Final    NO GROWTH 3 DAYS Performed at [facility], [place] [address] [place], [place] [address]    Report Status PENDING  Incomplete  Blood culture (routine x 2)     Status: None (Preliminary result)   Collection Time: 09/23/18  2:58 PM   Specimen: BLOOD  Result Value Ref Range Status   Specimen Description   Final    BLOOD LEFT ARM Performed at [facility] [address], [place], [place] [address]    Special Requests   Final    BOTTLES DRAWN AEROBIC ONLY Blood Culture adequate volume Performed at [facility] [address], [place], [place] [address]    Culture   Final    NO GROWTH 3 DAYS Performed at [facility], [place] [address], [place], [place] [address]    Report Status PENDING  Incomplete  SARS Coronavirus 2 [facility] order, Performed in [facility] hospital lab) Nasopharyngeal Nasopharyngeal Swab     Status: None   Collection Time: 09/23/18  3:06 PM   Specimen: Nasopharyngeal Swab  Result Value Ref Range Status   SARS Coronavirus 2 NEGATIVE NEGATIVE Final    Comment: (NOTE) If result is NEGATIVE SARS-CoV-2 target nucleic acids are NOT DETECTED. The SARS-CoV-2 RNA is generally detectable in upper and lower  respiratory specimens during the acute phase of infection. The lowest  concentration of SARS-CoV-2 viral copies this assay can detect is 250  copies / mL. A negative result does not preclude SARS-CoV-2 infection  and should not be used as the sole basis for treatment or other  patient management decisions.  A negative result may occur with  improper specimen collection / handling, submission of specimen other  than nasopharyngeal swab, presence of viral mutation(s) within the  areas targeted by this assay, and inadequate number of viral copies  (<250 copies / mL). A negative result must be combined with clinical  observations,  patient history, and epidemiological information. If result is POSITIVE SARS-CoV-2 target nucleic acids are DETECTED. The SARS-CoV-2 RNA is generally detectable in upper and lower  respiratory specimens dur ing the acute phase of infection.  Positive  results are indicative of active infection with SARS-CoV-2.  Clinical  correlation with patient history and other diagnostic information is  necessary to determine patient infection status.  Positive results do  not rule out bacterial infection or co-infection with other viruses. If result is PRESUMPTIVE POSTIVE SARS-CoV-2 nucleic acids MAY BE PRESENT.   A presumptive positive result was obtained on the submitted specimen  and confirmed on repeat testing.  While 2019 novel coronavirus  (SARS-CoV-2) nucleic acids may be present in the submitted sample  additional confirmatory testing may be necessary for epidemiological  and / or clinical management purposes  to differentiate between  SARS-CoV-2  and other Sarbecovirus currently known to infect humans.  If clinically indicated additional testing with an alternate test  methodology [phone]) is advised. The SARS-CoV-2 RNA is generally  detectable in upper and lower respiratory sp ecimens during the acute  phase of infection. The expected result is Negative. Fact Sheet for Patients:  StrictlyIdeas.no Fact Sheet for Healthcare Providers: BankingDealers.co.za This test is not yet approved or cleared by the [place] FDA and has been authorized for detection and/or diagnosis of SARS-CoV-2 by FDA under an Emergency Use Authorization (EUA).  This EUA will remain in effect (meaning this test can be used) for the duration of the COVID-19 declaration under Section 564(b)(1) of the Act, 21 U.S.C. section 360bbb-3(b)(1), unless the authorization is terminated or revoked sooner. Performed at [facility], [place] [address], [place], [place] [address]   Urine culture     Status: None   Collection Time: 09/23/18  3:22 PM   Specimen: Urine, Random  Result Value Ref Range Status   Specimen Description   Final    URINE, RANDOM Performed at [facility] [address], [place], [place] [address]    Special Requests   Final    NONE Performed at [facility], [place] [address] [place], [place] [address]    Culture   Final    NO GROWTH Performed at [facility], [place] [address] [place], [place] [address]    Report Status 09/24/2018 FINAL  Final  Aerobic/Anaerobic Culture (surgical/deep wound)     Status: None (Preliminary result)   Collection Time: 09/24/18 10:50 AM   Specimen: Abscess  Result Value Ref Range Status   Specimen Description   Final    ABSCESS RT ILIAC FOSSA Performed at [facility] [address], [place], [place] [address]    Special Requests   Final    NONE Performed at [facility], [place] [address], [place], [place] [address]    Gram Stain   Final    ABUNDANT WBC PRESENT, PREDOMINANTLY PMN MODERATE GRAM NEGATIVE RODS    Culture   Final    HOLDING FOR POSSIBLE ANAEROBE Performed at [facility], [place] [address] [place], [place] [address]    Report Status PENDING  Incomplete    Studies/Results: No results found.  Medications: Scheduled Meds: . cholecalciferol  2,000 Units Oral Daily  . folic acid  1 mg Oral Daily  . heparin  5,000 Units Subcutaneous Q8H  . oxyCODONE  10 mg Oral Q4H while awake  . oxyCODONE  15 mg Oral Q12H  . predniSONE  20 mg Oral BID WC  . sodium chloride flush  10-40 mL Intracatheter Q12H  . sodium chloride flush  5 mL Intracatheter Q8H  . cyanocobalamin  2,000 mcg Oral Daily   Continuous Infusions: . sodium chloride 75 mL/hr at 09/26/18 0100  . ceFEPime (MAXIPIME) IV 200 mL/hr at 09/25/18 1806   PRN Meds:.acetaminophen **OR** acetaminophen, bisacodyl,  HYDROmorphone (DILAUDID) injection, nortriptyline, ondansetron, polyethylene glycol, sodium chloride flush  Consultants:  Interventional radiology  Procedures:  CT-guided percutaneous iliopsoas abscess drainage  Antibiotics:  IV cefepime  Assessment/Plan: Principal Problem:   Iliopsoas abscess on right [facility]) Active Problems:   CKD (chronic kidney disease), stage IV (HCC)   Tachycardia   Leukocytosis   Nephrotic syndrome   Chronic pain syndrome   Acute right-sided low back pain   Iliopsoas abscess (HCC)   Abscess of right iliac fossa  Iliopsoas  abscess: S/p CT-guided percutaneous drainage.  Culture consistent for gram-negative rods.  Continue to follow culture and sensitivity panel. Sickle cell disease with pain crisis: Continue D5 0.45% saline at 75 mL/h Dilaudid 1 to 2 mg every 6 hours as needed. Oxycodone 10 mg every 4 hours while awake. IV Toradol contraindicated due to history of CKD/nephrotic syndrome Monitor vitals closely, reevaluate pain scale regularly 2 L supplemental oxygen as needed.  Leukocytosis: Continue IV antibiotics.  Continue to follow culture and sensitivities.  Sickle cell anemia: Hemoglobin stable, consistent with baseline.  No clinical indication for blood transfusion at this time. Continue to follow closely.  Chronic pain syndrome: Continue home medications.  Cocaine use disorder: Patient counseled extensively.  He is not interested in outpatient rehabilitation. Weaning IV Dilaudid.   Code Status: Full Code Family Communication: N/A Disposition Plan: Not yet ready for discharge  [place], MSN, FNP-C Patient [facility] [address] [place], [place] [address] [phone] If 5PM-7AM, please contact night-coverage.  09/26/2018, 1:47 PM  LOS: 3 days

## 2018-09-26 NOTE — Progress Notes (Signed)
Referring Physician(s): Ward,K  Supervising Physician: Jacqulynn Cadet  Patient Status:  [facility] - In-pt  Chief Complaint:  Right iliacus muscle abscess at the level of the mid right sacrum measuring up to 7.7 cm S/P drain placement by Dr. Anselm Pancoast 09/24/2018  Subjective:  Patient sitting on the side of the bed. Doing better. Hopes his drain is removed before he goes home.  Allergies: Nsaids and Morphine and related  Medications: Prior to Admission medications   Medication Sig Start Date End Date Taking? Authorizing Provider  cholecalciferol (VITAMIN D3) 25 MCG (1000 UT) tablet Take 2,000 Units by mouth daily.   Yes [provider]  [facility] VITAMIN B12 1000 MCG tablet Take 2,000 mcg by mouth daily. 06/21/18  Yes [provider]  folic acid (FOLVITE) 1 MG tablet Take 1 tablet (1 mg total) daily by mouth. 12/22/16  Yes Leana Gamer, MD  hydroxyurea (HYDREA) 500 MG capsule TAKE 3 CAPSULES BY MOUTH DAILY Patient taking differently: Take 1,500 mg by mouth daily.  06/19/18  Yes Lanae Boast, FNP  nortriptyline (PAMELOR) 25 MG capsule Take 1 capsule (25 mg total) by mouth at bedtime as needed for sleep. 06/10/18  Yes Donnamae Jude, MD  oxyCODONE (OXYCONTIN) 15 mg 12 hr tablet Take 1 tablet (15 mg total) by mouth every 12 (twelve) hours. 08/30/18  Yes [name], FNP  Oxycodone HCl 10 MG TABS Take 1 tablet (10 mg total) by mouth every 6 (six) hours as needed. Patient taking differently: Take 10 mg by mouth every 6 (six) hours as needed (pain).  08/30/18  Yes [name], FNP  predniSONE (DELTASONE) 20 MG tablet Take 20 mg by mouth 3 (three) times daily.  06/26/18  Yes [provider]     Vital Signs: BP (!) 146/104 (BP Location: Right Arm)    Pulse (!) 108    Temp 98.6 F (37 C) (Oral)    Resp 18    Wt 55.7 kg    SpO2 100%    BMI 21.75 kg/m   Physical Exam Awake and alert Right lower quadrant drain in place About 10 ml hazy peach colored fluid  in the bulb. 3mL recorded output  Imaging: Dg Chest Port 1 View  Result Date: 09/23/2018 CLINICAL DATA:  Chest pain, shortness of breath, history of sickle cell EXAM: PORTABLE CHEST 1 VIEW COMPARISON:  Multiple priors, most recently radiograph July 22, 2018 FINDINGS: Cardiomegaly similar to prior studies. Basilar bronchiectatic changes are present on prior studies. There is increasing opacity in the mesial bases. No pneumothorax. No effusion. Osteonecrosis of the right humeral head and sclerotic endplate changes of the vertebrae similar to prior studies and compatible with patient's known sickle cell disease. IMPRESSION: Increasing opacity in the mesial bases, which could represent developing pneumonia or atelectasis. Electronically Signed   By: Lovena Le M.D.   On: 09/23/2018 14:28   Ct Renal Stone Study  Addendum Date: 09/23/2018   ADDENDUM REPORT: 09/23/2018 15:25 ADDENDUM: Critical Value/emergent results were called by telephone at the time of interpretation on 09/23/2018 at 3:24 pm to Dr. Pryor Curia , who verbally acknowledged these results. Electronically Signed   By: Lowella Grip III M.D.   On: 09/23/2018 15:25   Addendum Date: 09/23/2018   ADDENDUM REPORT: 09/23/2018 15:20 ADDENDUM: There were technical difficulties regarding the initial dictation. The corrected remainder of the report is as follows: There is an apparent iliacus muscle abscess at the level of the mid right sacrum measuring 7.7  x 6.7 x 3.2 cm. There is bony reactive change in this area. There is apparent reflux of fluid into the distal esophagus. Electronically Signed   By: Lowella Grip III M.D.   On: 09/23/2018 15:20   Result Date: 09/23/2018 CLINICAL DATA:  Right flank pain.  Sickle cell disease. EXAM: CT ABDOMEN AND PELVIS WITHOUT CONTRAST TECHNIQUE: Multidetector CT imaging of the abdomen and pelvis was performed following the standard protocol without oral or IV contrast. COMPARISON:  April 19, 2017 FINDINGS:  Lower chest: There is scarring with bronchiectatic change in the lung bases. There is no lung base edema or consolidation. Hepatobiliary: No focal liver lesions are evident on this noncontrast enhanced study. Gallbladder is absent. There is no appreciable biliary duct dilatation. Pancreas: No pancreatic mass or inflammatory focus. Spleen: Evidence of splenic autoinfarction. Minimal splenic tissue, calcified. Adrenals/Urinary Tract: Adrenals appear normal bilaterally. Kidneys bilaterally show no evident mass or hydronephrosis. There is no evident renal or ureteral calculus on either side. Urinary bladder is distended. Urinary bladder wall thickness is within normal limits. Stomach/Bowel: There is no appreciable bowel wall or mesenteric thickening. No evident bowel obstruction. The terminal ileum appears unremarkable. There is no free air or portal venous air. Vascular/Lymphatic: There is no abdominal aortic aneurysm. No vascular lesions are evident on this noncontrast enhanced study. No adenopathy is appreciable in the abdomen or pelvis. Reproductive: Prostate and seminal vesicles are normal in size and contour. No evident pelvic mass. Other: Appendix appears normal. No abscess or ascites is evident in the abdomen or pelvis. Musculoskeletal: Bony structures show sclerosis indicative of known sickle cell anemia. There is a degree of avascular necrosis in each femoral head without flattening of the femoral heads. No intramuscular or abdominal wall lesions. Scarring is noted in the umbilical region. IMPRESSION: 1. No evident renal or ureteral calculus. No hydronephrosis. Urinary bladder is distended without wall thickening. This degree of distention raises concern for a degree of potential urinary bladder outlet obstruction. 2. No bowel wall thickening or bowel obstruction. No abscess in the abdomen or pelvis. Appendix appears normal. 3.  Bony changes indicative of sickle cell disease. 4.  Evidence of prior splenic  autoinfarction. 5.  Gallbladder absent. 6.  Scarring and bronchiectatic change in the lung bases. 7.  Periumbilical region scarring anteriorly. Electronically Signed: By: Lowella Grip III M.D. On: 09/23/2018 14:45   Ct Image Guided Drainage By Percutaneous Catheter  Result Date: 09/24/2018 INDICATION: 38 year old with fluid collection involving the right iliacus/iliopsoas muscle. Concern for abscess. EXAM: CT GUIDED DRAINAGE OF RIGHT ILIOPSOAS ABSCESS MEDICATIONS: Moderate sedation ANESTHESIA/SEDATION: 2.0 mg IV Versed 100 mcg IV Fentanyl Moderate Sedation Time:  20 minutes The patient was continuously monitored during the procedure by the interventional radiology nurse under my direct supervision. COMPLICATIONS: None immediate. TECHNIQUE: Informed written consent was obtained from the patient after a thorough discussion of the procedural risks, benefits and alternatives. All questions were addressed. A timeout was performed prior to the initiation of the procedure. PROCEDURE: Supine images of the lower abdomen were obtained with CT. The fluid collection involving the right iliacus and right iliopsoas were targeted. Right side of the abdomen was prepped with chlorhexidine and sterile field was created. Skin and soft tissues were anesthetized with 1% lidocaine. Using CT guidance, 18 gauge trocar needle was directed into the fluid collection. Yellow purulent fluid was aspirated. Stiff Amplatz wire was advanced into the collection and the tract was dilated to accommodate a 10.2 [place] drain. Fluid was collected for culture.  Catheter was attached to a suction bulb and sutured to skin. Dressing was placed. FINDINGS: Again noted is a low-density collection involving the right iliacus and right iliopsoas muscle. Yellow purulent fluid was removed from the collection and compatible with an abscess. Drain placed within the iliopsoas abscess. IMPRESSION: CT-guided placement of a drainage catheter within the right  iliopsoas abscess. Electronically Signed   By: Markus Daft M.D.   On: 09/24/2018 12:44    Labs:  CBC: Recent Labs    08/27/18 1535 09/23/18 1140 09/24/18 0438 09/25/18 0531  WBC 10.6 22.0* 22.0* 16.1*  HGB 8.2* 7.1* 7.1* 8.7*  HCT 24.2* 22.2* 22.9* 26.1*  PLT 238 306 325 255    COAGS: Recent Labs    03/07/18 0329 09/24/18 0438  INR 1.04 1.0  APTT 34  --     BMP: Recent Labs    08/21/18 1327 08/27/18 1535 09/23/18 1140 09/24/18 0438 09/25/18 0531 09/26/18 0349  NA 135 139 137 132*  --   --   K 4.2 4.8 4.2 4.5  --   --   CL 109 108* 111 107  --   --   CO2 18* 16* 16* 16*  --   --   GLUCOSE 128* 93 96 88  --   --   BUN 37* 43* 39* 30*  --   --   CALCIUM 8.9 8.0* 8.5* 8.3*  --   --   CREATININE 2.21* 2.42* 2.46* 2.27* 2.75* 3.46*  GFRNONAA 37* 33* 33* 36* 29* 22*  GFRAA 43* 39* 38* 42* 33* 25*    LIVER FUNCTION TESTS: Recent Labs    08/19/18 0643 08/27/18 1535 09/23/18 1140 09/24/18 0438  BILITOT 1.2 0.8 0.7 1.0  AST 34 34 20 28  ALT 63* 45* 26 33  ALKPHOS 215* 184* 138* 166*  PROT 5.4* 5.0* 6.6 6.3*  ALBUMIN 2.7* 3.0* 2.9* 2.7*    Assessment and Plan:   Right iliacus muscle abscess at the level of the mid right sacrum measuring up to 7.7 cm.  S/P drain placement by Dr. Anselm Pancoast 09/24/2018.  I did inform patient that he may in fact go home with the drain in place, especially since there is still a copious amount of drainage.  When output is down to about 10 mL per day, repeat CT scan to evaluate for resolution of abscess.  If he is discharged with the drain in place, we will arrange OP f/u at our clinic.  Electronically Signed: Murrell Redden, PA-C 09/26/2018, 2:38 PM    I spent a total of 15 Minutes at the the patient's bedside AND on the patient's hospital floor or unit, greater than 50% of which was counseling/coordinating care for f/u drain placement.

## 2018-09-27 LAB — CREATININE, SERUM
Creatinine, Ser: 3.38 mg/dL — ABNORMAL HIGH (ref 0.61–1.24)
GFR calc Af Amer: 26 mL/min — ABNORMAL LOW (ref >60)
GFR calc non Af Amer: 22 mL/min — ABNORMAL LOW (ref >60)

## 2018-09-27 NOTE — Progress Notes (Signed)
Subjective: [name] [name], a 38 year old male with a medical history significant for sickle cell disease, CKD 4, opiate dependence, history of migraines, history of GERD, history of polysubstance abuse, and history of splenectomy was admitted for iliopsoas abscess and sickle cell pain crisis.  Patient states that pain has improved some overnight.  Current pain intensity 7/10 primarily to low back and lower extremities.  Patient is s/p day 4 CT-guided drainage of iliopsoas abscess.  Continues to drain moderate, purulent.  Patient is followed by IR, who suggested discharging home with drain in place.  Objective:  Vital signs in last 24 hours:  Vitals:   09/26/18 1355 09/26/18 2227 09/27/18 0617 09/27/18 1436  BP: (!) 146/104 (!) 126/98 (!) 134/96 (!) 138/107  Pulse: (!) 108 93 93 (!) 109  Resp: 18 16 16    Temp: 98.6 F (37 C) 98.5 F (36.9 C) 98 F (36.7 C) 98.6 F (37 C)  TempSrc: Oral Oral Oral Oral  SpO2: 100% 100% 100% 99%  Weight:   54.3 kg     Intake/Output from previous day:   Intake/Output Summary (Last 24 hours) at 09/27/2018 1452 Last data filed at 09/27/2018 0800 Gross per 24 hour  Intake 2245 ml  Output 510 ml  Net 1735 ml    Physical Exam: General: Alert, awake, oriented x3, in no acute distress.  HEENT: [place]/AT PEERL, EOMI Neck: Trachea midline,  no masses, no thyromegal,y no JVD, no carotid bruit OROPHARYNX:  Moist, No exudate/ erythema/lesions.  Heart: Regular rate and rhythm, without murmurs, rubs, gallops, PMI non-displaced, no heaves or thrills on palpation.  Lungs: Clear to auscultation, no wheezing or rhonchi noted. No increased vocal fremitus resonant to percussion  Abdomen: Soft, nontender, nondistended, positive bowel sounds, no masses no hepatosplenomegaly noted..  Neuro: No focal neurological deficits noted cranial nerves II through XII grossly intact. DTRs 2+ bilaterally upper and lower extremities. Strength 5 out of 5 in bilateral upper and lower  extremities. Musculoskeletal: No warm swelling or erythema around joints, no spinal tenderness noted. Psychiatric: Patient alert and oriented x3, good insight and cognition, good recent to remote recall. Lymph node survey: No cervical axillary or inguinal lymphadenopathy noted. Skin: Dressing clean, dry, and intact.  Appears to be draining appropriately Lab Results:  Basic Metabolic Panel:    Component Value Date/Time   NA 132 (L) 09/24/2018 0438   NA 139 08/27/2018 1535   K 4.5 09/24/2018 0438   CL 107 09/24/2018 0438   CO2 16 (L) 09/24/2018 0438   BUN 30 (H) 09/24/2018 0438   BUN 43 (H) 08/27/2018 1535   CREATININE 3.38 (H) 09/27/2018 0604   CREATININE 1.45 (H) 05/12/2014 1430   GLUCOSE 88 09/24/2018 0438   CALCIUM 8.3 (L) 09/24/2018 0438   CBC:    Component Value Date/Time   WBC 16.1 (H) 09/25/2018 0531   HGB 8.7 (L) 09/25/2018 0531   HGB 8.2 (L) 08/27/2018 1535   HCT 26.1 (L) 09/25/2018 0531   HCT 24.2 (L) 08/27/2018 1535   PLT 255 09/25/2018 0531   PLT 238 08/27/2018 1535   MCV 113.5 (H) 09/25/2018 0531   MCV 98 (H) 08/27/2018 1535   NEUTROABS 15.1 (H) 09/25/2018 0531   NEUTROABS 8.6 (H) 08/27/2018 1535   LYMPHSABS 0.4 (L) 09/25/2018 0531   LYMPHSABS 1.2 08/27/2018 1535   MONOABS 0.4 09/25/2018 0531   EOSABS 0.0 09/25/2018 0531   EOSABS 0.0 08/27/2018 1535   BASOSABS 0.0 09/25/2018 0531   BASOSABS 0.0 08/27/2018 1535  Recent Results (from the past 240 hour(s))  Blood culture (routine x 2)     Status: None (Preliminary result)   Collection Time: 09/23/18  2:58 PM   Specimen: BLOOD  Result Value Ref Range Status   Specimen Description   Final    BLOOD LEFT ARM Performed at [facility] [address], [place], [place] [address]    Special Requests   Final    BOTTLES DRAWN AEROBIC ONLY Blood Culture adequate volume Performed at [facility] [address], [place], [place] [address]    Culture   Final    NO GROWTH  4 DAYS Performed at [facility], [name] [address] [place], [place] [address]    Report Status PENDING  Incomplete  Blood culture (routine x 2)     Status: None (Preliminary result)   Collection Time: 09/23/18  2:58 PM   Specimen: BLOOD  Result Value Ref Range Status   Specimen Description   Final    BLOOD LEFT ARM Performed at [facility] [address], [place], [place] [address]    Special Requests   Final    BOTTLES DRAWN AEROBIC ONLY Blood Culture adequate volume Performed at [facility] [address], [place], [place] [address]    Culture   Final    NO GROWTH 4 DAYS Performed at [facility], [place] [address], [place],  [address]    Report Status PENDING  Incomplete  SARS Coronavirus 2 [facility] order, Performed in [facility] hospital lab) Nasopharyngeal Nasopharyngeal Swab     Status: None   Collection Time: 09/23/18  3:06 PM   Specimen: Nasopharyngeal Swab  Result Value Ref Range Status   SARS Coronavirus 2 NEGATIVE NEGATIVE Final    Comment: (NOTE) If result is NEGATIVE SARS-CoV-2 target nucleic acids are NOT DETECTED. The SARS-CoV-2 RNA is generally detectable in upper and lower  respiratory specimens during the acute phase of infection. The lowest  concentration of SARS-CoV-2 viral copies this assay can detect is 250  copies / mL. A negative result does not preclude SARS-CoV-2 infection  and should not be used as the sole basis for treatment or other  patient management decisions.  A negative result may occur with  improper specimen collection / handling, submission of specimen other  than nasopharyngeal swab, presence of viral mutation(s) within the  areas targeted by this assay, and inadequate number of viral copies  (<250 copies / mL). A negative result must be combined with clinical  observations, patient history, and epidemiological information. If result is POSITIVE SARS-CoV-2 target nucleic acids  are DETECTED. The SARS-CoV-2 RNA is generally detectable in upper and lower  respiratory specimens dur ing the acute phase of infection.  Positive  results are indicative of active infection with SARS-CoV-2.  Clinical  correlation with patient history and other diagnostic information is  necessary to determine patient infection status.  Positive results do  not rule out bacterial infection or co-infection with other viruses. If result is PRESUMPTIVE POSTIVE SARS-CoV-2 nucleic acids MAY BE PRESENT.   A presumptive positive result was obtained on the submitted specimen  and confirmed on repeat testing.  While 2019 novel coronavirus  (SARS-CoV-2) nucleic acids may be present in the submitted sample  additional confirmatory testing may be necessary for epidemiological  and / or clinical management purposes  to differentiate between  SARS-CoV-2 and other Sarbecovirus currently known to infect humans.  If clinically indicated additional  testing with an alternate test  methodology [phone]) is advised. The SARS-CoV-2 RNA is generally  detectable in upper and lower respiratory sp ecimens during the acute  phase of infection. The expected result is Negative. Fact Sheet for Patients:  StrictlyIdeas.no Fact Sheet for Healthcare Providers: BankingDealers.co.za This test is not yet approved or cleared by the [place] FDA and has been authorized for detection and/or diagnosis of SARS-CoV-2 by FDA under an Emergency Use Authorization (EUA).  This EUA will remain in effect (meaning this test can be used) for the duration of the COVID-19 declaration under Section 564(b)(1) of the Act, 21 U.S.C. section 360bbb-3(b)(1), unless the authorization is terminated or revoked sooner. Performed at [facility], [place] [address], [place], [place] [address]   Urine culture     Status: None   Collection Time: 09/23/18  3:22 PM   Specimen:  Urine, Random  Result Value Ref Range Status   Specimen Description   Final    URINE, RANDOM Performed at [facility] [address], [place], [place] [address]    Special Requests   Final    NONE Performed at [facility], [place] [address] [place], [place] [address]    Culture   Final    NO GROWTH Performed at [facility], [place] [address], [place], [place] [address]    Report Status 09/24/2018 FINAL  Final  Aerobic/Anaerobic Culture (surgical/deep wound)     Status: None (Preliminary result)   Collection Time: 09/24/18 10:50 AM   Specimen: Abscess  Result Value Ref Range Status   Specimen Description   Final    ABSCESS RT ILIAC FOSSA Performed at [facility] [address] [place], [place] [address]    Special Requests   Final    NONE Performed at [facility], [place] [address] [place], [place] [address]    Gram Stain   Final    ABUNDANT WBC PRESENT, PREDOMINANTLY PMN MODERATE GRAM NEGATIVE RODS    Culture   Final    HOLDING FOR POSSIBLE ANAEROBE Performed at [facility], [place] [address], [place],  [address]    Report Status PENDING  Incomplete    Studies/Results: No results found.  Medications: Scheduled Meds: . cholecalciferol  2,000 Units Oral Daily  . folic acid  1 mg Oral Daily  . heparin  5,000 Units Subcutaneous Q8H  . oxyCODONE  10 mg Oral Q4H while awake  . oxyCODONE  15 mg Oral Q12H  . predniSONE  20 mg Oral BID WC  . sodium chloride flush  10-40 mL Intracatheter Q12H  . sodium chloride flush  5 mL Intracatheter Q8H  . cyanocobalamin  2,000 mcg Oral Daily   Continuous Infusions: . sodium chloride 75 mL/hr at 09/26/18 1655  . ceFEPime (MAXIPIME) IV 2 g (09/26/18 1656)   PRN Meds:.acetaminophen **OR** acetaminophen, bisacodyl, HYDROmorphone (DILAUDID) injection, nortriptyline, ondansetron, polyethylene glycol, sodium chloride  flush  Consultants:  Interventional radiology  Procedures: CT-guided drainage of the right iliopsoas abscess Antibiotics:  Cefepime  Assessment/Plan: Principal Problem:   Iliopsoas abscess on right [facility]) Active Problems:   CKD (chronic kidney disease), stage IV (HCC)   Tachycardia   Leukocytosis   Nephrotic syndrome   Chronic pain syndrome   Acute right-sided low back pain   Iliopsoas abscess (HCC)   Abscess of right iliac fossa   Muscle abscess  Iliopsoas abscess: Patient s/p CT-guided percutaneous drain placement.  Culture yielded gram-negative  rods Reviewed notes from interventional radiology, suggest that patient discharges home with drain in place.  He is to follow-up in IR on next week. Continue broad-spectrum antibiotics Patient will likely discharge on 09/28/2018   Sickle cell disease with pain crisis: Continue D5 0.45% saline at 75 mL/h Oxycodone 10 mg every 4 hours while awake IV Toradol contraindicated due to history of CKD/nephrotic syndrome Monitor vital signs closely Reevaluate pain scale regularly 2 L supplemental oxygen as needed  Leukocytosis:  Continue IV antibiotics Anaerobic culture pending CBC in am  Chronic pain syndrome:  Continue home medications  Cocaine use disorder:  Patient counseled extensively. He is not interested in outpatient drug rehabilitation at this time.    Code Status: Full Code Family Communication: N/A Disposition Plan: Not yet ready for discharge  [place], MSN, FNP-C Patient [facility] [address] [place], [place] [address] [phone]  If 7PM-7AM, please contact night-coverage.  09/27/2018, 2:52 PM  LOS: 4 days

## 2018-09-27 NOTE — Progress Notes (Signed)
Referring Physician(s): Ward,K/[name],O  Supervising Physician: Corrie Mckusick  Patient Status:  [facility] - In-pt  Chief Complaint: Right hip pain  Subjective: Pt cont to have rt hip pain; a little drowsy this am   Allergies: Nsaids and Morphine and related  Medications: Prior to Admission medications   Medication Sig Start Date End Date Taking? Authorizing Provider  cholecalciferol (VITAMIN D3) 25 MCG (1000 UT) tablet Take 2,000 Units by mouth daily.   Yes [provider]  [facility] VITAMIN B12 1000 MCG tablet Take 2,000 mcg by mouth daily. 06/21/18  Yes [provider]  folic acid (FOLVITE) 1 MG tablet Take 1 tablet (1 mg total) daily by mouth. 12/22/16  Yes Leana Gamer, MD  hydroxyurea (HYDREA) 500 MG capsule TAKE 3 CAPSULES BY MOUTH DAILY Patient taking differently: Take 1,500 mg by mouth daily.  06/19/18  Yes Lanae Boast, FNP  nortriptyline (PAMELOR) 25 MG capsule Take 1 capsule (25 mg total) by mouth at bedtime as needed for sleep. 06/10/18  Yes Donnamae Jude, MD  oxyCODONE (OXYCONTIN) 15 mg 12 hr tablet Take 1 tablet (15 mg total) by mouth every 12 (twelve) hours. 08/30/18  Yes [name], FNP  Oxycodone HCl 10 MG TABS Take 1 tablet (10 mg total) by mouth every 6 (six) hours as needed. Patient taking differently: Take 10 mg by mouth every 6 (six) hours as needed (pain).  08/30/18  Yes [name], FNP  predniSONE (DELTASONE) 20 MG tablet Take 20 mg by mouth 3 (three) times daily.  06/26/18  Yes [provider]     Vital Signs: BP (!) 134/96 (BP Location: Left Arm)   Pulse 93   Temp 98 F (36.7 C) (Oral)   Resp 16   Wt 119 lb 11.4 oz (54.3 kg)   SpO2 100%   BMI 21.21 kg/m   Physical Exam awake but sl drowsy; RLQ drain intact, insertion site ok, sl tender to palpation; output 10 cc turbid, sl blood tinged fluid; drain flushed without difficulty  Imaging: Dg Chest Port 1 View  Result Date: 09/23/2018 CLINICAL DATA:  Chest  pain, shortness of breath, history of sickle cell EXAM: PORTABLE CHEST 1 VIEW COMPARISON:  Multiple priors, most recently radiograph July 22, 2018 FINDINGS: Cardiomegaly similar to prior studies. Basilar bronchiectatic changes are present on prior studies. There is increasing opacity in the mesial bases. No pneumothorax. No effusion. Osteonecrosis of the right humeral head and sclerotic endplate changes of the vertebrae similar to prior studies and compatible with patient's known sickle cell disease. IMPRESSION: Increasing opacity in the mesial bases, which could represent developing pneumonia or atelectasis. Electronically Signed   By: Lovena Le M.D.   On: 09/23/2018 14:28   Ct Renal Stone Study  Addendum Date: 09/23/2018   ADDENDUM REPORT: 09/23/2018 15:25 ADDENDUM: Critical Value/emergent results were called by telephone at the time of interpretation on 09/23/2018 at 3:24 pm to Dr. Pryor Curia , who verbally acknowledged these results. Electronically Signed   By: Lowella Grip III M.D.   On: 09/23/2018 15:25   Addendum Date: 09/23/2018   ADDENDUM REPORT: 09/23/2018 15:20 ADDENDUM: There were technical difficulties regarding the initial dictation. The corrected remainder of the report is as follows: There is an apparent iliacus muscle abscess at the level of the mid right sacrum measuring 7.7 x 6.7 x 3.2 cm. There is bony reactive change in this area. There is apparent reflux of fluid into the distal esophagus. Electronically Signed   By:  Lowella Grip III M.D.   On: 09/23/2018 15:20   Result Date: 09/23/2018 CLINICAL DATA:  Right flank pain.  Sickle cell disease. EXAM: CT ABDOMEN AND PELVIS WITHOUT CONTRAST TECHNIQUE: Multidetector CT imaging of the abdomen and pelvis was performed following the standard protocol without oral or IV contrast. COMPARISON:  April 19, 2017 FINDINGS: Lower chest: There is scarring with bronchiectatic change in the lung bases. There is no lung base edema or  consolidation. Hepatobiliary: No focal liver lesions are evident on this noncontrast enhanced study. Gallbladder is absent. There is no appreciable biliary duct dilatation. Pancreas: No pancreatic mass or inflammatory focus. Spleen: Evidence of splenic autoinfarction. Minimal splenic tissue, calcified. Adrenals/Urinary Tract: Adrenals appear normal bilaterally. Kidneys bilaterally show no evident mass or hydronephrosis. There is no evident renal or ureteral calculus on either side. Urinary bladder is distended. Urinary bladder wall thickness is within normal limits. Stomach/Bowel: There is no appreciable bowel wall or mesenteric thickening. No evident bowel obstruction. The terminal ileum appears unremarkable. There is no free air or portal venous air. Vascular/Lymphatic: There is no abdominal aortic aneurysm. No vascular lesions are evident on this noncontrast enhanced study. No adenopathy is appreciable in the abdomen or pelvis. Reproductive: Prostate and seminal vesicles are normal in size and contour. No evident pelvic mass. Other: Appendix appears normal. No abscess or ascites is evident in the abdomen or pelvis. Musculoskeletal: Bony structures show sclerosis indicative of known sickle cell anemia. There is a degree of avascular necrosis in each femoral head without flattening of the femoral heads. No intramuscular or abdominal wall lesions. Scarring is noted in the umbilical region. IMPRESSION: 1. No evident renal or ureteral calculus. No hydronephrosis. Urinary bladder is distended without wall thickening. This degree of distention raises concern for a degree of potential urinary bladder outlet obstruction. 2. No bowel wall thickening or bowel obstruction. No abscess in the abdomen or pelvis. Appendix appears normal. 3.  Bony changes indicative of sickle cell disease. 4.  Evidence of prior splenic autoinfarction. 5.  Gallbladder absent. 6.  Scarring and bronchiectatic change in the lung bases. 7.   Periumbilical region scarring anteriorly. Electronically Signed: By: Lowella Grip III M.D. On: 09/23/2018 14:45   Ct Image Guided Drainage By Percutaneous Catheter  Result Date: 09/24/2018 INDICATION: 38 year old with fluid collection involving the right iliacus/iliopsoas muscle. Concern for abscess. EXAM: CT GUIDED DRAINAGE OF RIGHT ILIOPSOAS ABSCESS MEDICATIONS: Moderate sedation ANESTHESIA/SEDATION: 2.0 mg IV Versed 100 mcg IV Fentanyl Moderate Sedation Time:  20 minutes The patient was continuously monitored during the procedure by the interventional radiology nurse under my direct supervision. COMPLICATIONS: None immediate. TECHNIQUE: Informed written consent was obtained from the patient after a thorough discussion of the procedural risks, benefits and alternatives. All questions were addressed. A timeout was performed prior to the initiation of the procedure. PROCEDURE: Supine images of the lower abdomen were obtained with CT. The fluid collection involving the right iliacus and right iliopsoas were targeted. Right side of the abdomen was prepped with chlorhexidine and sterile field was created. Skin and soft tissues were anesthetized with 1% lidocaine. Using CT guidance, 18 gauge trocar needle was directed into the fluid collection. Yellow purulent fluid was aspirated. Stiff Amplatz wire was advanced into the collection and the tract was dilated to accommodate a 10.2 [place] drain. Fluid was collected for culture. Catheter was attached to a suction bulb and sutured to skin. Dressing was placed. FINDINGS: Again noted is a low-density collection involving the right iliacus and right iliopsoas  muscle. Yellow purulent fluid was removed from the collection and compatible with an abscess. Drain placed within the iliopsoas abscess. IMPRESSION: CT-guided placement of a drainage catheter within the right iliopsoas abscess. Electronically Signed   By: Markus Daft M.D.   On: 09/24/2018 12:44    Labs:  CBC:  Recent Labs    08/27/18 1535 09/23/18 1140 09/24/18 0438 09/25/18 0531  WBC 10.6 22.0* 22.0* 16.1*  HGB 8.2* 7.1* 7.1* 8.7*  HCT 24.2* 22.2* 22.9* 26.1*  PLT 238 306 325 255    COAGS: Recent Labs    03/07/18 0329 09/24/18 0438  INR 1.04 1.0  APTT 34  --     BMP: Recent Labs    08/21/18 1327 08/27/18 1535 09/23/18 1140 09/24/18 0438 09/25/18 0531 09/26/18 0349 09/27/18 0604  NA 135 139 137 132*  --   --   --   K 4.2 4.8 4.2 4.5  --   --   --   CL 109 108* 111 107  --   --   --   CO2 18* 16* 16* 16*  --   --   --   GLUCOSE 128* 93 96 88  --   --   --   BUN 37* 43* 39* 30*  --   --   --   CALCIUM 8.9 8.0* 8.5* 8.3*  --   --   --   CREATININE 2.21* 2.42* 2.46* 2.27* 2.75* 3.46* 3.38*  GFRNONAA 37* 33* 33* 36* 29* 22* 22*  GFRAA 43* 39* 38* 42* 33* 25* 26*    LIVER FUNCTION TESTS: Recent Labs    08/19/18 0643 08/27/18 1535 09/23/18 1140 09/24/18 0438  BILITOT 1.2 0.8 0.7 1.0  AST 34 34 20 28  ALT 63* 45* 26 33  ALKPHOS 215* 184* 138* 166*  PROT 5.4* 5.0* 6.6 6.3*  ALBUMIN 2.7* 3.0* 2.9* 2.7*    Assessment and Plan: Patient with history of sickle cell disease, chronic kidney disease, opioid dependence, GERD, prior splenectomy, recent worsening right lower quadrant pain and imaging findings of fluid collection in right iliacus /iliopsoas muscle; status post drain placement on 8/11; afebrile, drain fluid cx pending, creat 3.38(3.46); drain output declining; if output remains low over next 24 hrs and pt remains inhouse consider f/u CT on 8/15; if he is discharged today will schedule him for f/u in IR drain clinic next week; as OP rec once daily flushing of drain with 3-5 cc sterile NS, output recording and dressing changes every 1-2 days   Electronically Signed: D. Rowe Robert, PA-C 09/27/2018, 11:34 AM   I spent a total of  15 minutes at the the patient's bedside AND on the patient's hospital floor or unit, greater than 50% of which was  counseling/coordinating care for right lower abdominal/pelvic abscess drain    Patient ID: [name] Meyer., male   DOB: [birth year], 38 y.o.   MRN: [id number]

## 2018-09-28 LAB — BASIC METABOLIC PANEL
Anion gap: 10 (ref 5–15)
BUN: 67 mg/dL — ABNORMAL HIGH (ref 6–20)
CO2: 13 mmol/L — ABNORMAL LOW (ref 22–32)
Calcium: 9.4 mg/dL (ref 8.9–10.3)
Chloride: 110 mmol/L (ref 98–111)
Creatinine, Ser: 3.91 mg/dL — ABNORMAL HIGH (ref 0.61–1.24)
GFR calc Af Amer: 22 mL/min — ABNORMAL LOW (ref >60)
GFR calc non Af Amer: 19 mL/min — ABNORMAL LOW (ref >60)
Glucose, Bld: 112 mg/dL — ABNORMAL HIGH (ref 70–99)
Potassium: 4.7 mmol/L (ref 3.5–5.1)
Sodium: 133 mmol/L — ABNORMAL LOW (ref 135–145)

## 2018-09-28 LAB — CULTURE, BLOOD (ROUTINE X 2)
Culture: NO GROWTH
Culture: NO GROWTH
Special Requests: ADEQUATE
Special Requests: ADEQUATE

## 2018-09-28 LAB — AEROBIC/ANAEROBIC CULTURE W GRAM STAIN (SURGICAL/DEEP WOUND)

## 2018-09-28 LAB — CBC WITH DIFFERENTIAL/PLATELET
Abs Immature Granulocytes: 0.42 10*3/uL — ABNORMAL HIGH (ref 0.00–0.07)
Basophils Absolute: 0.1 10*3/uL (ref 0.0–0.1)
Basophils Relative: 0 %
Eosinophils Absolute: 0 10*3/uL (ref 0.0–0.5)
Eosinophils Relative: 0 %
HCT: 19.3 % — ABNORMAL LOW (ref 39.0–52.0)
Hemoglobin: 6 g/dL — CL (ref 13.0–17.0)
Immature Granulocytes: 2 %
Lymphocytes Relative: 7 %
Lymphs Abs: 1.6 10*3/uL (ref 0.7–4.0)
MCH: 35.3 pg — ABNORMAL HIGH (ref 26.0–34.0)
MCHC: 31.1 g/dL (ref 30.0–36.0)
MCV: 113.5 fL — ABNORMAL HIGH (ref 80.0–100.0)
Monocytes Absolute: 3.1 10*3/uL — ABNORMAL HIGH (ref 0.1–1.0)
Monocytes Relative: 15 %
Neutro Abs: 16.2 10*3/uL — ABNORMAL HIGH (ref 1.7–7.7)
Neutrophils Relative %: 76 %
Platelets: 394 10*3/uL (ref 150–400)
RBC: 1.7 MIL/uL — ABNORMAL LOW (ref 4.22–5.81)
RDW: 26.9 % — ABNORMAL HIGH (ref 11.5–15.5)
WBC: 21.3 10*3/uL — ABNORMAL HIGH (ref 4.0–10.5)
nRBC: 11.4 % — ABNORMAL HIGH (ref 0.0–0.2)

## 2018-09-28 LAB — PREPARE RBC (CROSSMATCH)

## 2018-09-28 IMAGING — US US ABDOMEN LIMITED
1 series · 4 of 4 positions shown · non-contrast
Comparison: None.

CLINICAL DATA: Ascites seen on CT abdomen and pelvis 04/19/2017.
Evaluate for paracentesis.

EXAM:
LIMITED ABDOMEN ULTRASOUND FOR ASCITES
TECHNIQUE: Limited ultrasound survey for ascites was performed in all four
abdominal quadrants.

[Series 1: us abdomen limited · 0.25mm/px · 4 of 4 slices shown]
[im 1/4]
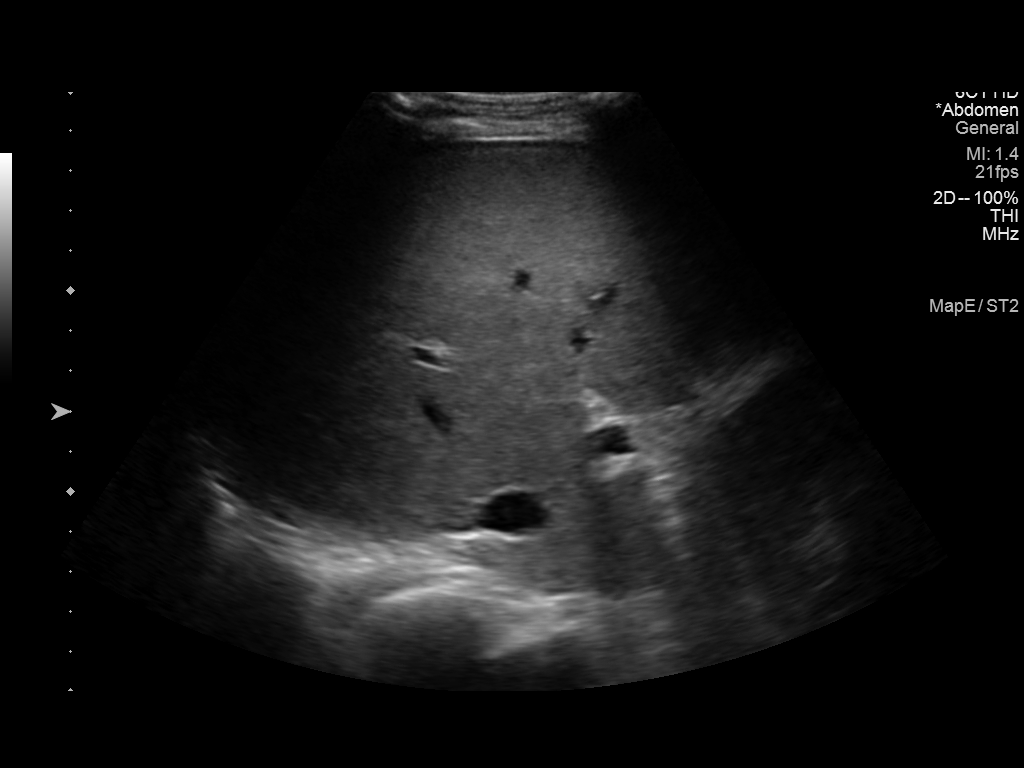
[im 2/4]
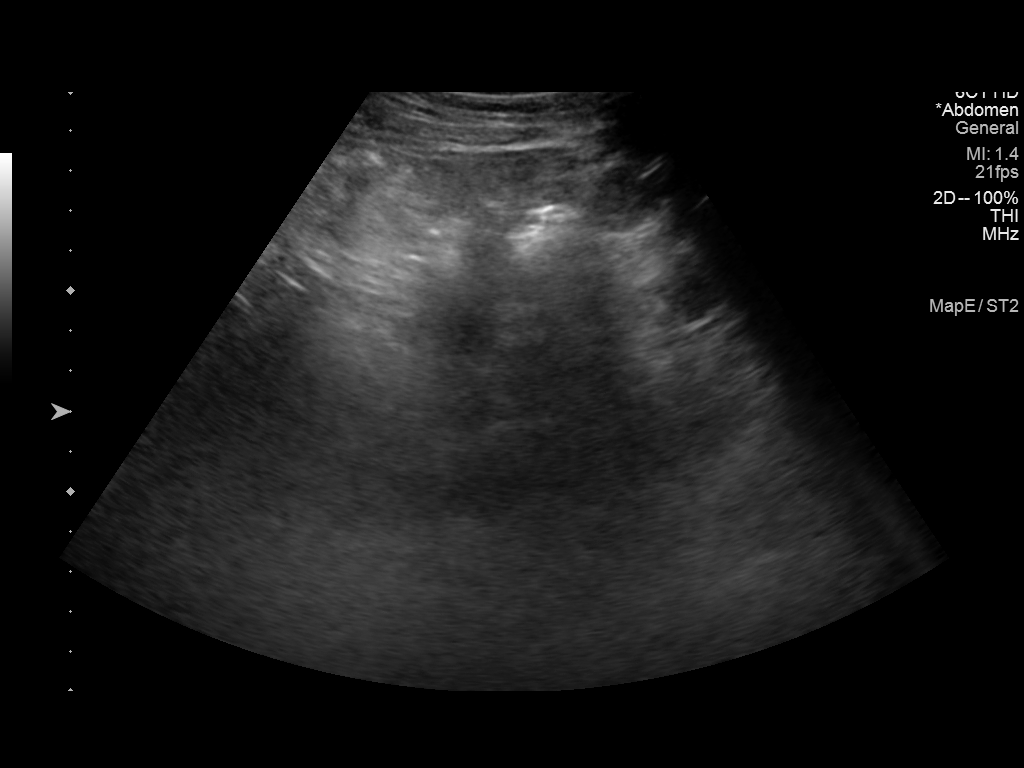
[im 3/4]
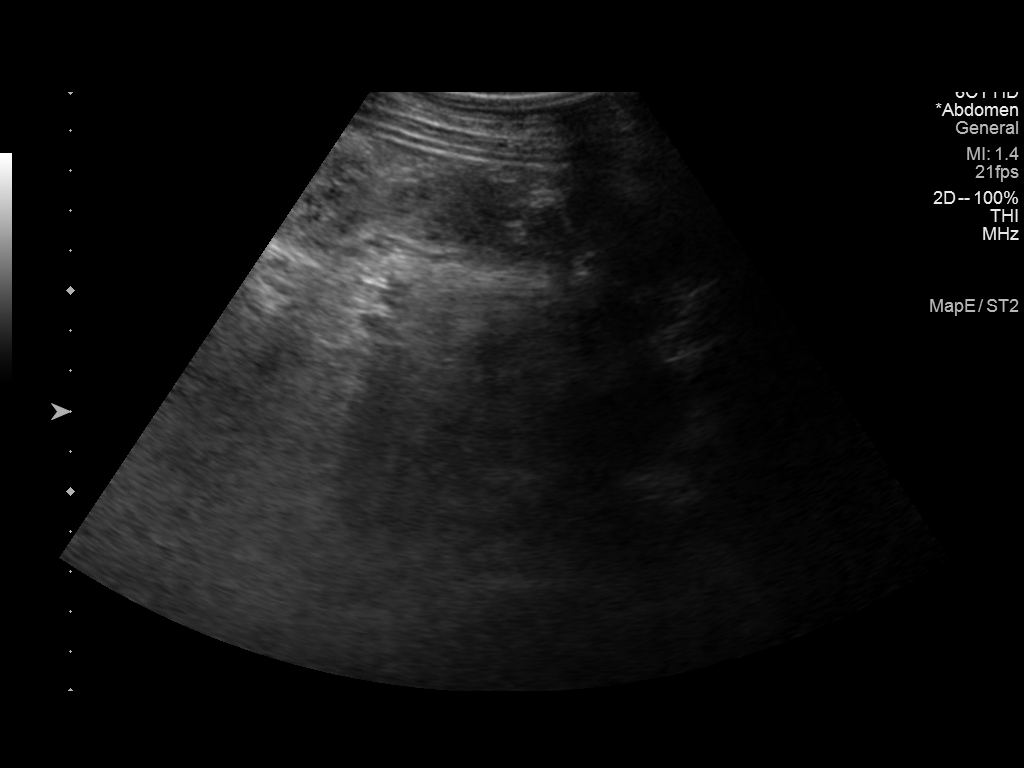
[im 4/4]
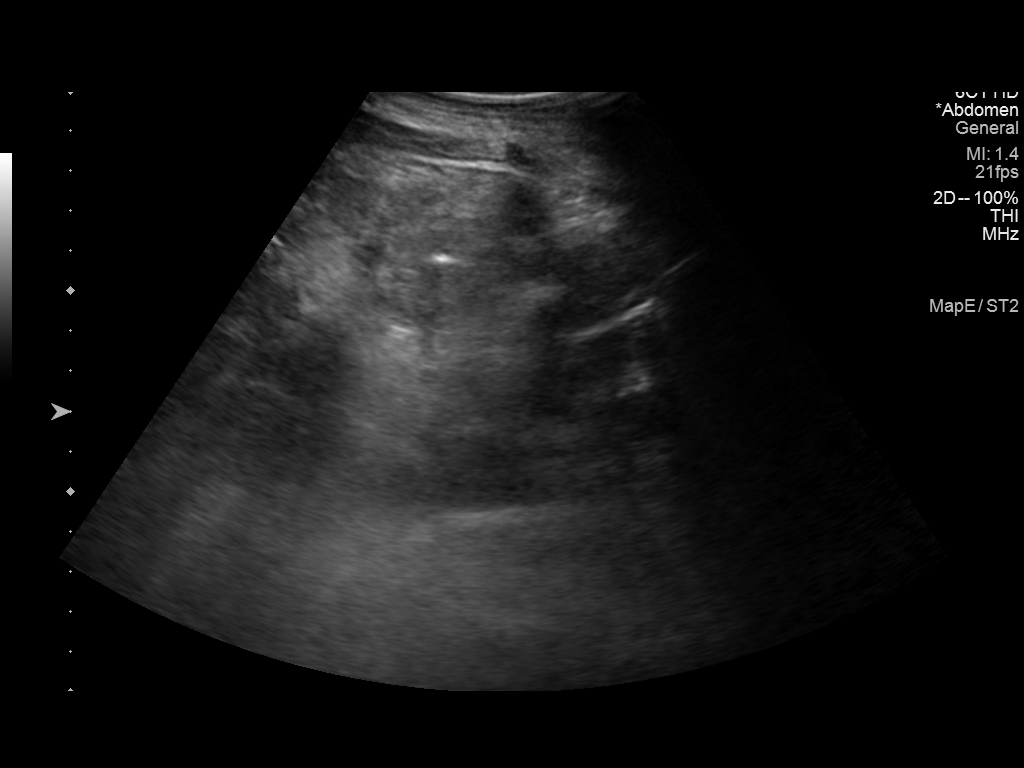

[4 of 4 positions shown; findings below may reference images not displayed]

FINDINGS: Very little ascitic fluid is identified. The patient is not a
candidate for paracentesis.
IMPRESSION: As above.

## 2018-09-28 MED ORDER — SODIUM CHLORIDE 0.9% IV SOLUTION
Freq: Once | INTRAVENOUS | Status: AC
  Administered 2018-09-28: 14:00:00 via INTRAVENOUS

## 2018-09-28 MED ORDER — AMOXICILLIN-POT CLAVULANATE 875-125 MG PO TABS: 1.0000 | ORAL_TABLET | Freq: Two times a day (BID) | ORAL | 0 refills | Status: AC

## 2018-09-28 NOTE — Progress Notes (Signed)
Patient tolerated the blood transfusion well. No post H&H has been ordered.

## 2018-09-28 NOTE — Progress Notes (Signed)
CRITICAL VALUE ALERT  Critical Value:  hbg 6.0  Date & Time Notied:  09/28/2018 0859  Provider Notified: Jonelle Sidle  Orders Received/Actions taken: Md paged.

## 2018-09-29 LAB — TYPE AND SCREEN
ABO/RH(D): O POS
Antibody Screen: NEGATIVE
Donor AG Type: NEGATIVE
Unit division: 0

## 2018-09-29 LAB — BPAM RBC
Blood Product Expiration Date: 202009012359
ISSUE DATE / TIME: 202008151358
Unit Type and Rh: 9500

## 2018-09-29 IMAGING — DX DG CHEST 1V PORT
1 series · 1 of 1 positions shown · non-contrast
Comparison: April 07, 2017

CLINICAL DATA: Central catheter placement

EXAM:
PORTABLE CHEST 1 VIEW

[chest ap]
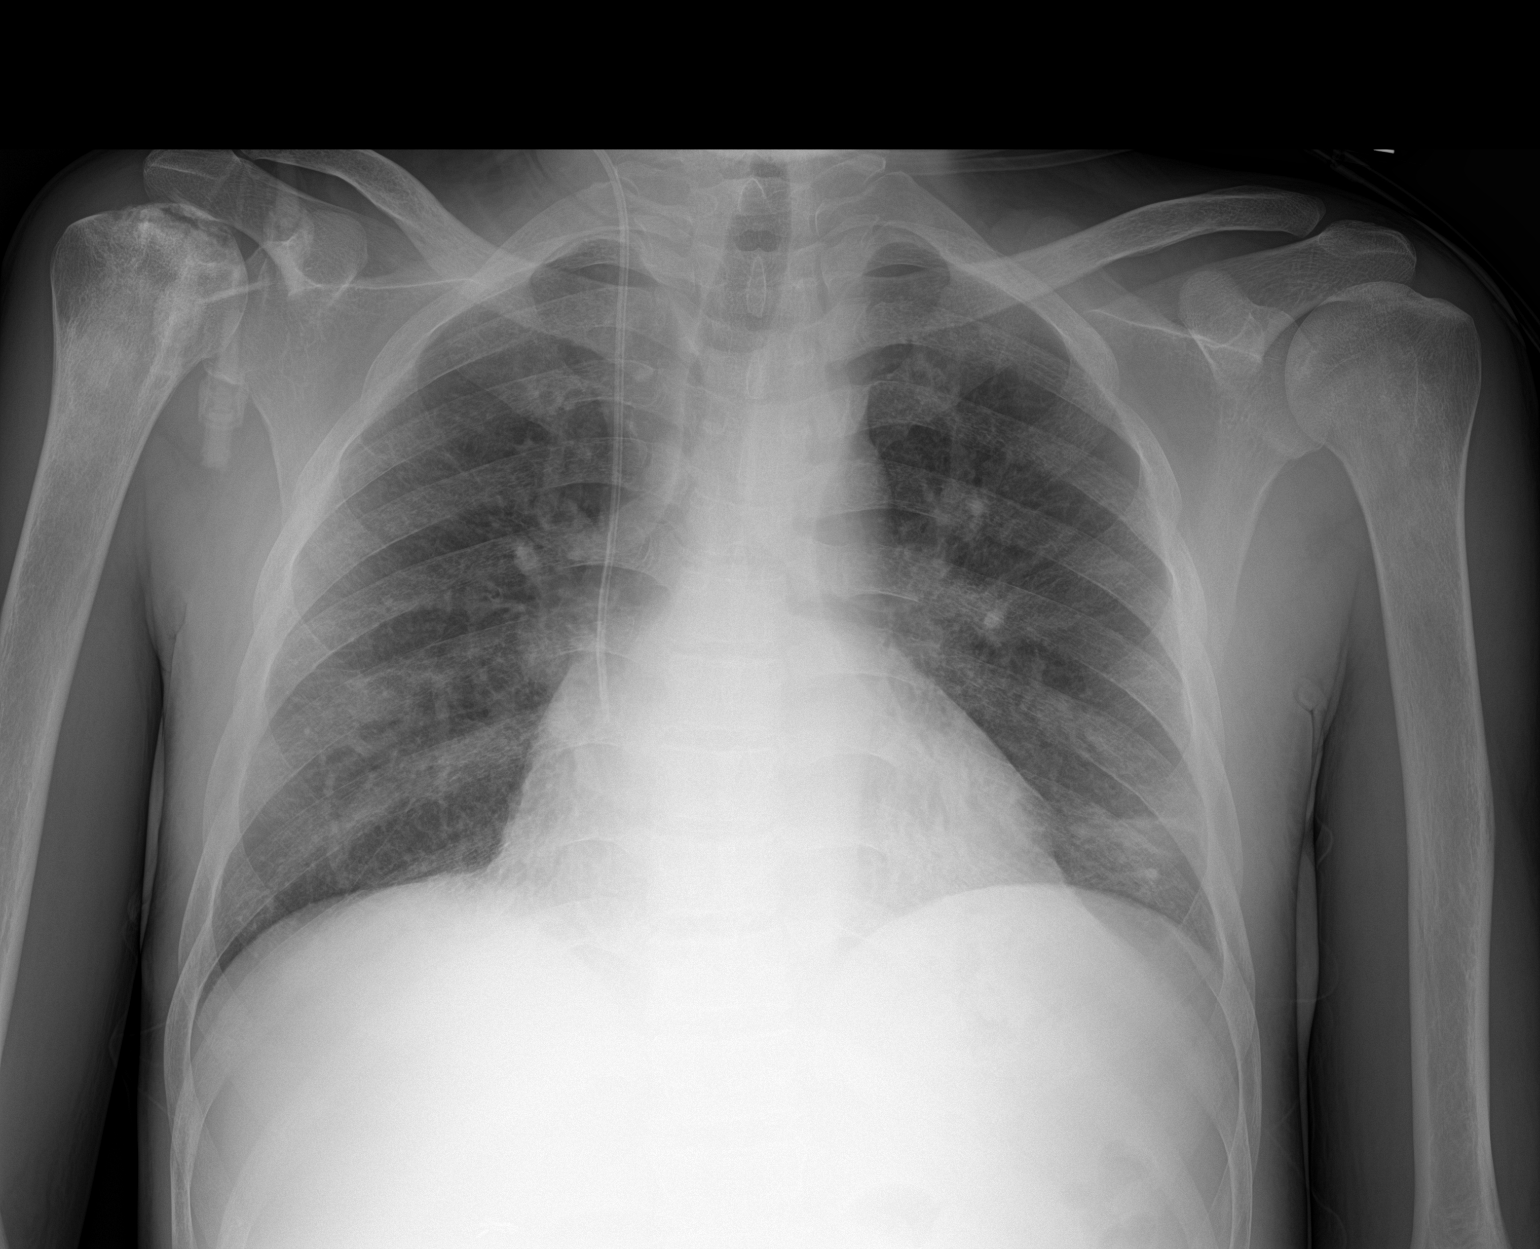

[1 of 1 positions shown; findings below may reference images not displayed]

FINDINGS: Central catheter tip is in the superior vena cava near the
cavoatrial junction. No pneumothorax.

There is patchy bibasilar lung atelectatic change. Lungs elsewhere
are clear. Heart size and pulmonary vascularity are normal. No
adenopathy.

There is avascular necrosis in the right humeral head.
IMPRESSION: 1. Central catheter tip in superior vena cava near cavoatrial
junction. No pneumothorax.

2. Bibasilar atelectasis, slightly more on the left than the right.
Earliest changes of pneumonia cannot be excluded.

3.  Avascular necrosis right humeral head.

## 2018-09-30 ENCOUNTER — Other Ambulatory Visit: Payer: Self-pay | Admitting: Internal Medicine

## 2018-09-30 DIAGNOSIS — K6812 Psoas muscle abscess: Secondary | ICD-10-CM

## 2018-09-30 IMAGING — US US BIOPSY
1 series · 8 of 8 positions shown · non-contrast
Comparison: none

CLINICAL DATA: Six anemia, acute on chronic kidney disease and
nephrotic syndrome. Renal biopsy requested.

[Series 1: us biopsy · 0.20mm/px · 8 of 8 slices shown]
[im 1/8]
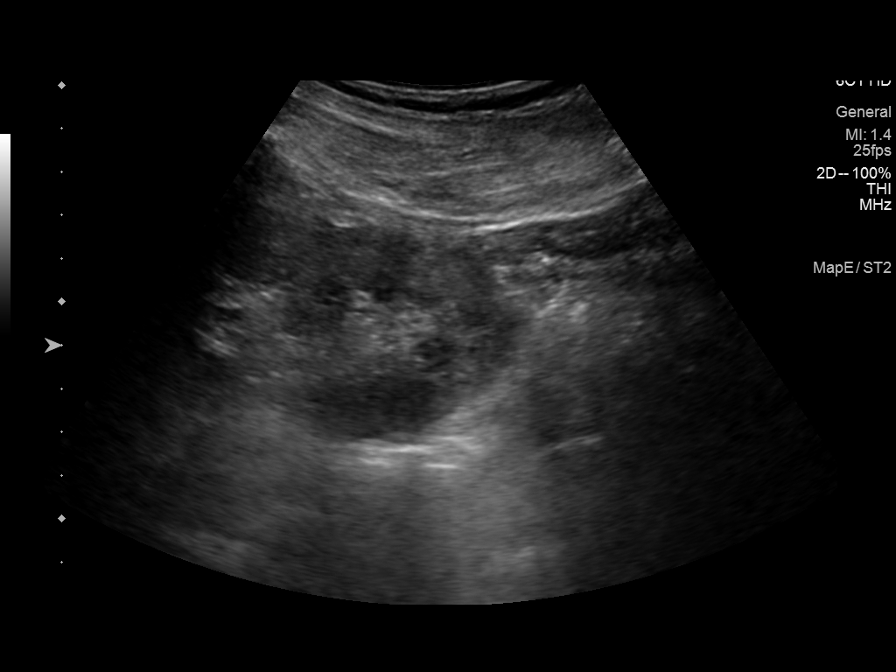
[im 2/8]
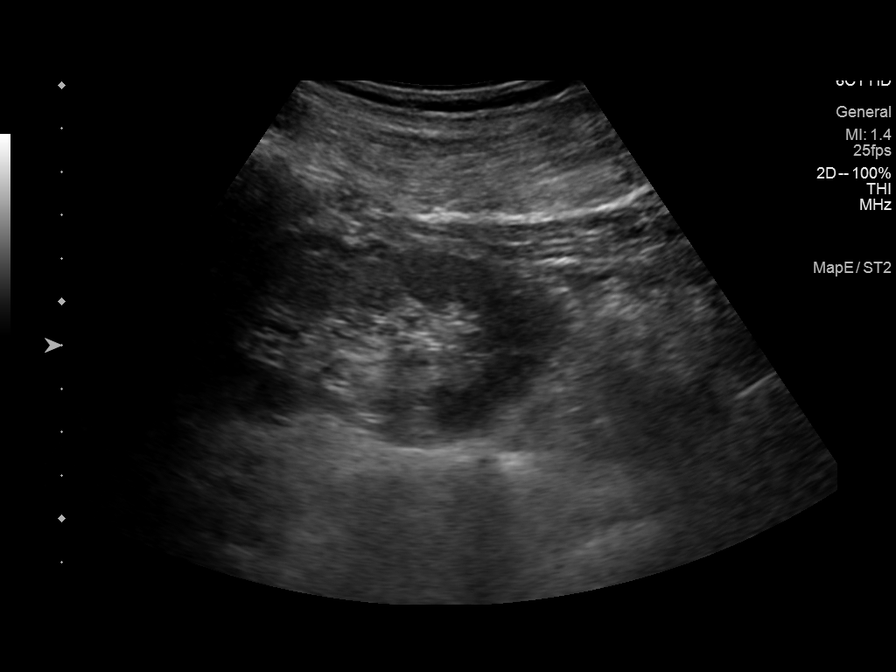
[im 3/8]
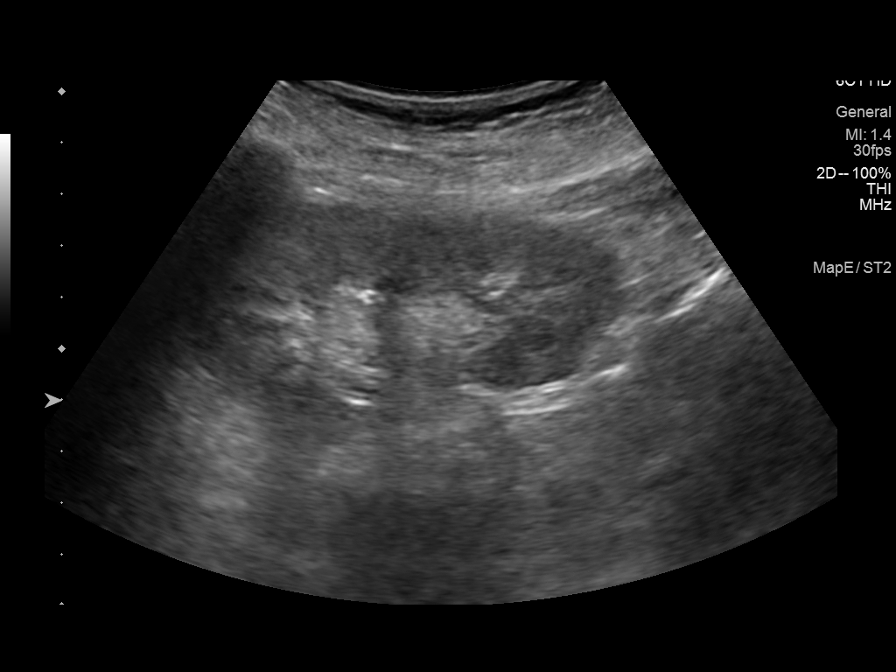
[im 4/8]
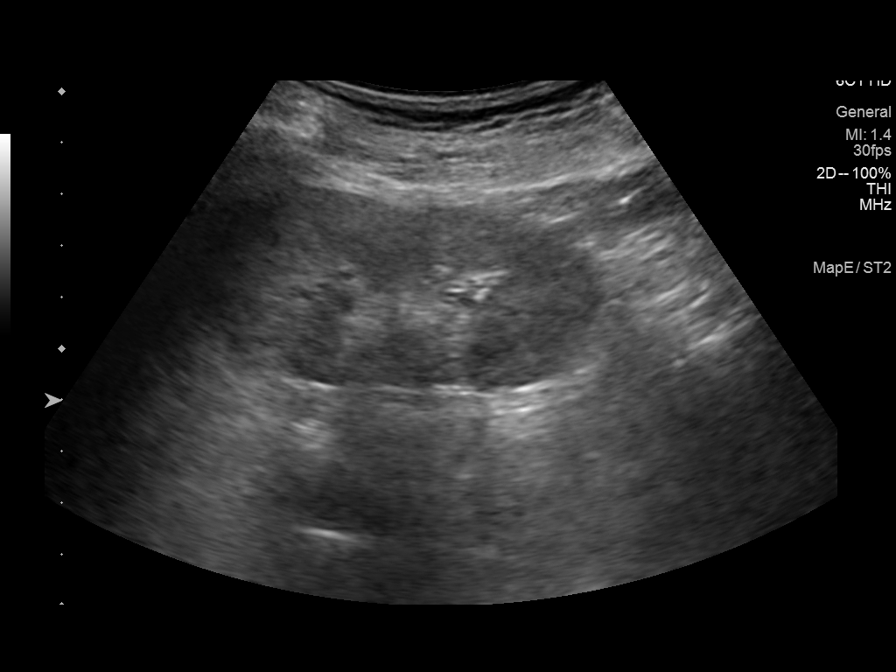
[im 5/8]
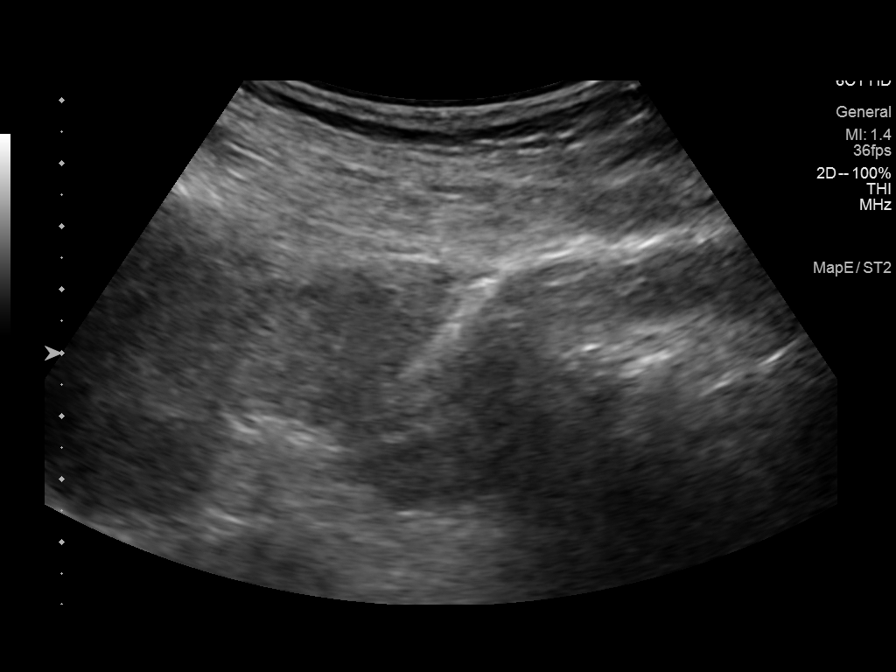
[im 6/8]
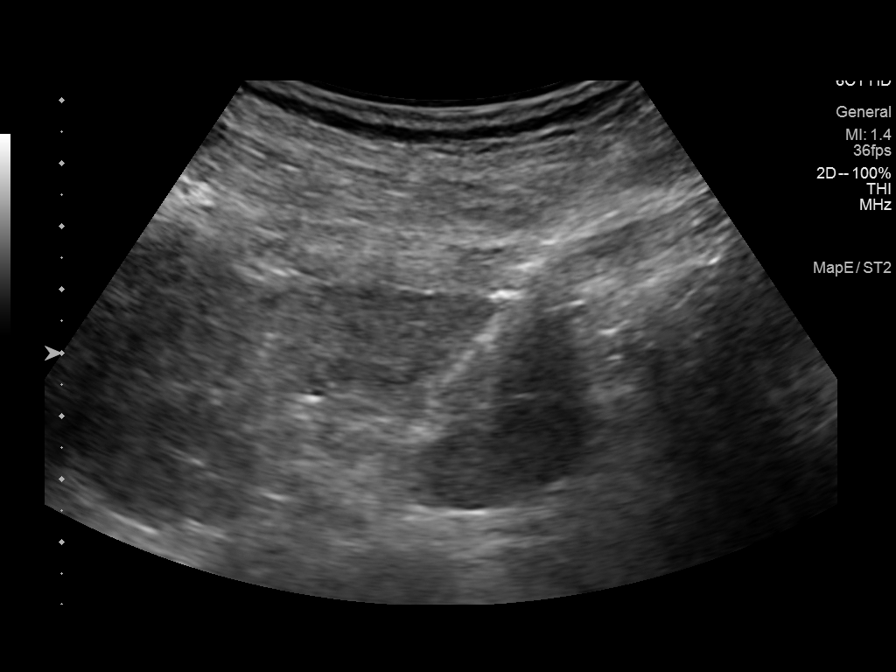
[im 7/8]
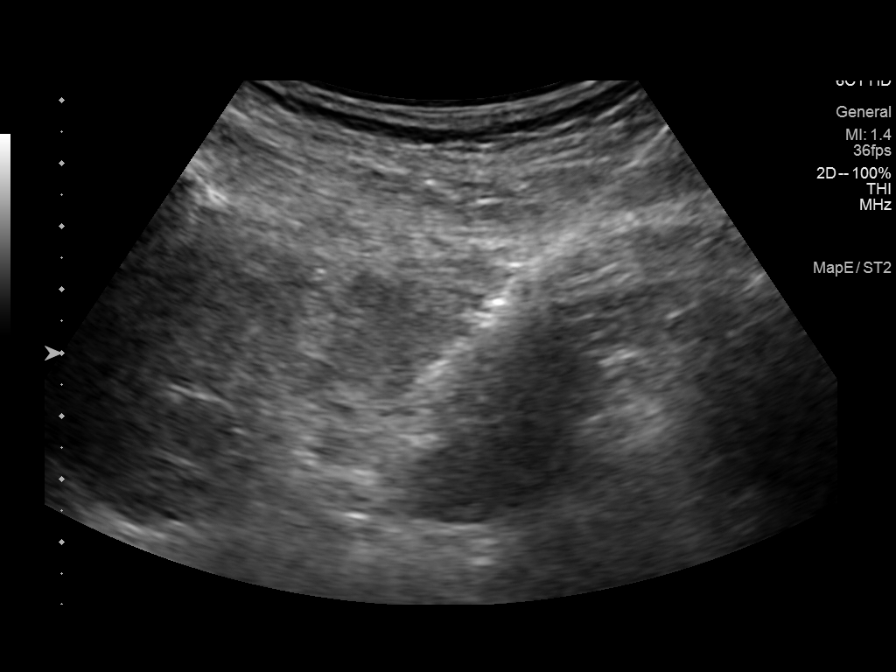
[im 8/8]
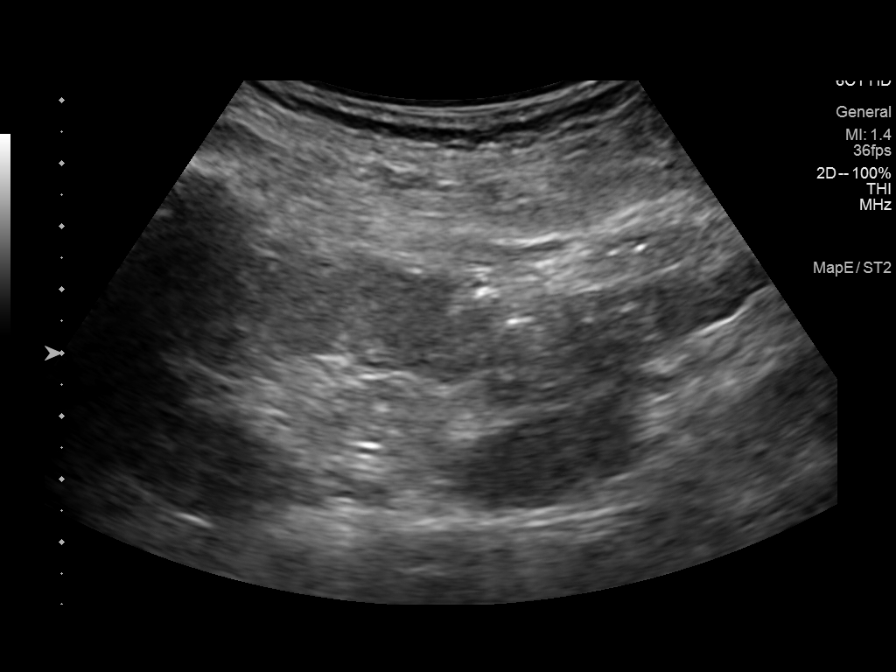

[8 of 8 positions shown; findings below may reference images not displayed]

EXAM:
ULTRASOUND GUIDED CORE BIOPSY OF LEFT KIDNEY

MEDICATIONS:
2.0 mg IV Versed; 150 mcg IV Fentanyl

Total Moderate Sedation Time: 16 minutes.

The patient's level of consciousness and physiologic status were
continuously monitored during the procedure by Radiology nursing.

PROCEDURE:
The procedure, risks, benefits, and alternatives were explained to
the patient. Questions regarding the procedure were encouraged and
answered. The patient understands and consents to the procedure. A
time out was performed prior to initiating the procedure.

Both kidneys were examined by ultrasound. The left flank region was
prepped with chlorhexidine in a sterile fashion, and a sterile drape
was applied covering the operative field. A sterile gown and sterile
gloves were used for the procedure. Local anesthesia was provided
with 1% Lidocaine.

Samples of the left lower pole renal cortex were obtained with a 16
gauge core device. Three passes with the core biopsy device were
performed and samples sent in saline.

COMPLICATIONS:
None.
FINDINGS: Both kidneys are well visualized by ultrasound. The left kidney was
chosen as it is slightly more superficial in location from a
posterior approach and also demonstrated slightly thicker renal
cortex compared to the right. The first core biopsy sample was short
in length. Two additional samples were therefore obtained in order
to have a sufficient amount of tissue for pathologic analysis.
IMPRESSION: Ultrasound-guided core biopsy performed at the level of left lower
pole renal cortex.

## 2018-09-30 NOTE — Discharge Summary (Signed)
Physician Discharge Summary  Patient ID: [name] [name]. MRN: [id number] DOB/AGE: May 20, 1980 38 y.o.  Admit date: 09/23/2018 Discharge date: 09/28/2018  Admission Diagnoses:  Discharge Diagnoses:  Principal Problem:   Iliopsoas abscess on right [facility]) Active Problems:   CKD (chronic kidney disease), stage IV (HCC)   Tachycardia   Leukocytosis   Nephrotic syndrome   Chronic pain syndrome   Acute right-sided low back pain   Iliopsoas abscess (HCC)   Abscess of right iliac fossa   Muscle abscess   Discharged Condition: good  Hospital Course: Patient is a 38 year old gentleman with history of sickle cell disease and chronic kidney disease stage 4 who was admitted with iliopsoas abscess as well as sickle cell crisis.  He has chronic nephrotic syndrome.  Patient treated with drainage as well as antibiotics.  Interventional radiology has followed the patient.  He has catheter left in place to be seen and followed in the outpatient setting setting.  He was also treated for the sickle cell crisis with IV Dilaudid PCA.  No Toradol due to renal disease.  Patient was also on chronic oral pain medications.  He has done much better.  No fever or chills.  He did have significant anemia with hemoglobin dropping to 6.0.  Patient was transfused 1 unit of packed red blood cells prior to discharge.  He is to follow-up with interventional radiology and up primary care physician.  Consults: nephrology and Interventional radiology  Significant Diagnostic Studies: labs: Serial CBCs and CMP's, microbiology: blood culture: negative and wound culture: negative and radiology: Percutaneous drainage of abscess  Treatments: IV hydration, antibiotics: vancomycin, Zosyn and Meropenem and analgesia: acetaminophen and Dilaudid  Discharge Exam: Blood pressure (!) 145/91, pulse 80, temperature 98.4 F (36.9 C), temperature source Oral, resp. rate 18, weight 54.3 kg, SpO2 100 %. General appearance: alert,  cooperative, appears stated age and no distress Head: Normocephalic, without obvious abnormality, atraumatic Neck: no adenopathy, no carotid bruit, no JVD, supple, symmetrical, trachea midline and thyroid not enlarged, symmetric, no tenderness/mass/nodules Back: symmetric, no curvature. ROM normal. No CVA tenderness. Resp: clear to auscultation bilaterally Cardio: regular rate and rhythm, S1, S2 normal, no murmur, click, rub or gallop GI: soft, non-tender; bowel sounds normal; no masses,  no organomegaly Extremities: extremities normal, atraumatic, no cyanosis or edema Pulses: 2+ and symmetric Neurologic: Grossly normal  Disposition:   Discharge Instructions    Diet - low sodium heart healthy   Complete by: As directed    Increase activity slowly   Complete by: As directed      Allergies as of 09/28/2018      Reactions   Nsaids Other (See Comments)   Kidney disease   Morphine And Related Other (See Comments)   "Real bad headaches"       Medication List    TAKE these medications   amoxicillin-clavulanate 875-125 MG tablet Commonly known as: Augmentin Take 1 tablet by mouth 2 (two) times daily for 14 days.   cholecalciferol 25 MCG (1000 UT) tablet Commonly known as: VITAMIN D3 Take 2,000 Units by mouth daily.   [facility] VITAMIN B12 1000 MCG tablet Generic drug: cyanocobalamin Take 2,000 mcg by mouth daily.   folic acid 1 MG tablet Commonly known as: FOLVITE Take 1 tablet (1 mg total) daily by mouth.   hydroxyurea 500 MG capsule Commonly known as: HYDREA TAKE 3 CAPSULES BY MOUTH DAILY What changed:   how much to take  how to take this  when to take this  additional instructions   nortriptyline 25 MG capsule Commonly known as: PAMELOR Take 1 capsule (25 mg total) by mouth at bedtime as needed for sleep.   oxyCODONE 15 mg 12 hr tablet Commonly known as: OxyCONTIN Take 1 tablet (15 mg total) by mouth every 12 (twelve) hours. What changed: Another medication with  the same name was changed. Make sure you understand how and when to take each.   Oxycodone HCl 10 MG Tabs Take 1 tablet (10 mg total) by mouth every 6 (six) hours as needed. What changed: reasons to take this   predniSONE 20 MG tablet Commonly known as: DELTASONE Take 20 mg by mouth 3 (three) times daily.        SignedBarbette Merino 09/30/2018, 12:17 AM   Time spent 32 minutes

## 2018-10-01 ENCOUNTER — Other Ambulatory Visit: Payer: Self-pay

## 2018-10-01 ENCOUNTER — Encounter: Payer: Self-pay | Admitting: Family Medicine

## 2018-10-01 ENCOUNTER — Ambulatory Visit (INDEPENDENT_AMBULATORY_CARE_PROVIDER_SITE_OTHER): Payer: Medicare Other | Admitting: Family Medicine

## 2018-10-01 VITALS — BP 124/91 | HR 129 | Temp 99.0°F | Resp 14 | Ht 63.0 in | Wt 132.0 lb

## 2018-10-01 DIAGNOSIS — D638 Anemia in other chronic diseases classified elsewhere: Secondary | ICD-10-CM

## 2018-10-01 DIAGNOSIS — D5700 Hb-SS disease with crisis, unspecified: Secondary | ICD-10-CM | POA: Diagnosis not present

## 2018-10-01 DIAGNOSIS — E559 Vitamin D deficiency, unspecified: Secondary | ICD-10-CM | POA: Diagnosis not present

## 2018-10-01 DIAGNOSIS — G894 Chronic pain syndrome: Secondary | ICD-10-CM

## 2018-10-01 DIAGNOSIS — E875 Hyperkalemia: Secondary | ICD-10-CM | POA: Diagnosis not present

## 2018-10-01 DIAGNOSIS — K6812 Psoas muscle abscess: Secondary | ICD-10-CM

## 2018-10-01 DIAGNOSIS — D72829 Elevated white blood cell count, unspecified: Secondary | ICD-10-CM

## 2018-10-01 DIAGNOSIS — D5709 Hb-ss disease with crisis with other specified complication: Secondary | ICD-10-CM

## 2018-10-01 DIAGNOSIS — R809 Proteinuria, unspecified: Secondary | ICD-10-CM | POA: Diagnosis not present

## 2018-10-01 DIAGNOSIS — N08 Glomerular disorders in diseases classified elsewhere: Secondary | ICD-10-CM | POA: Diagnosis not present

## 2018-10-01 DIAGNOSIS — N183 Chronic kidney disease, stage 3 (moderate): Secondary | ICD-10-CM | POA: Diagnosis not present

## 2018-10-01 DIAGNOSIS — N049 Nephrotic syndrome with unspecified morphologic changes: Secondary | ICD-10-CM | POA: Diagnosis not present

## 2018-10-01 LAB — POCT URINALYSIS DIPSTICK
Appearance: NEGATIVE
Bilirubin, UA: NEGATIVE
Glucose, UA: POSITIVE — AB
Ketones, UA: NEGATIVE
Leukocytes, UA: NEGATIVE
Nitrite, UA: NEGATIVE
Protein, UA: POSITIVE — AB
Spec Grav, UA: 1.02 (ref 1.010–1.025)
Urobilinogen, UA: 0.2 E.U./dL
pH, UA: 7.5 (ref 5.0–8.0)

## 2018-10-02 ENCOUNTER — Encounter: Payer: Self-pay | Admitting: *Deleted

## 2018-10-02 ENCOUNTER — Telehealth: Payer: Self-pay

## 2018-10-02 ENCOUNTER — Ambulatory Visit
Admission: RE | Admit: 2018-10-02 | Discharge: 2018-10-02 | Disposition: A | Discharge status: Home / Self Care | Payer: Medicare Other | Source: Ambulatory Visit | Attending: Radiology | Admitting: Radiology

## 2018-10-02 ENCOUNTER — Ambulatory Visit
Admission: RE | Admit: 2018-10-02 | Discharge: 2018-10-02 | Disposition: A | Discharge status: Home / Self Care | Payer: Medicare Other | Source: Ambulatory Visit | Attending: Internal Medicine | Admitting: Internal Medicine

## 2018-10-02 ENCOUNTER — Other Ambulatory Visit: Payer: Self-pay | Admitting: Internal Medicine

## 2018-10-02 DIAGNOSIS — K6812 Psoas muscle abscess: Secondary | ICD-10-CM

## 2018-10-02 DIAGNOSIS — D571 Sickle-cell disease without crisis: Secondary | ICD-10-CM | POA: Diagnosis not present

## 2018-10-02 DIAGNOSIS — D735 Infarction of spleen: Secondary | ICD-10-CM | POA: Diagnosis not present

## 2018-10-02 HISTORY — PX: IR RADIOLOGIST EVAL & MGMT: IMG5224

## 2018-10-02 LAB — CBC WITH DIFFERENTIAL/PLATELET
Basophils Absolute: 0 10*3/uL (ref 0.0–0.2)
Basos: 0 %
EOS (ABSOLUTE): 0 10*3/uL (ref 0.0–0.4)
Eos: 0 %
Hematocrit: 22.6 % — ABNORMAL LOW (ref 37.5–51.0)
Hemoglobin: 7.4 g/dL — ABNORMAL LOW (ref 13.0–17.7)
Immature Grans (Abs): 0.2 10*3/uL — ABNORMAL HIGH (ref 0.0–0.1)
Immature Granulocytes: 1 %
Lymphocytes Absolute: 3.7 10*3/uL — ABNORMAL HIGH (ref 0.7–3.1)
Lymphs: 20 %
MCH: 33.5 pg — ABNORMAL HIGH (ref 26.6–33.0)
MCHC: 32.7 g/dL (ref 31.5–35.7)
MCV: 102 fL — ABNORMAL HIGH (ref 79–97)
Monocytes Absolute: 1.3 10*3/uL — ABNORMAL HIGH (ref 0.1–0.9)
Monocytes: 7 %
NRBC: 3 % — ABNORMAL HIGH (ref 0–0)
Neutrophils Absolute: 13.1 10*3/uL — ABNORMAL HIGH (ref 1.4–7.0)
Neutrophils: 72 %
Platelets: 391 10*3/uL (ref 150–450)
RBC: 2.21 x10E6/uL — CL (ref 4.14–5.80)
RDW: 23.5 % — ABNORMAL HIGH (ref 11.6–15.4)
WBC: 18.3 10*3/uL — ABNORMAL HIGH (ref 3.4–10.8)

## 2018-10-02 LAB — COMPREHENSIVE METABOLIC PANEL
ALT: 50 IU/L — ABNORMAL HIGH (ref 0–44)
AST: 45 IU/L — ABNORMAL HIGH (ref 0–40)
Albumin/Globulin Ratio: 1.6 (ref 1.2–2.2)
Albumin: 3.4 g/dL — ABNORMAL LOW (ref 4.0–5.0)
Alkaline Phosphatase: 153 IU/L — ABNORMAL HIGH (ref 39–117)
BUN/Creatinine Ratio: 16 (ref 9–20)
BUN: 42 mg/dL — ABNORMAL HIGH (ref 6–20)
Bilirubin Total: 0.8 mg/dL (ref 0.0–1.2)
CO2: 24 mmol/L (ref 20–29)
Calcium: 8.4 mg/dL — ABNORMAL LOW (ref 8.7–10.2)
Chloride: 97 mmol/L (ref 96–106)
Creatinine, Ser: 2.66 mg/dL — ABNORMAL HIGH (ref 0.76–1.27)
GFR calc Af Amer: 34 mL/min/{1.73_m2} — ABNORMAL LOW (ref >59)
GFR calc non Af Amer: 30 mL/min/{1.73_m2} — ABNORMAL LOW (ref >59)
Globulin, Total: 2.1 g/dL (ref 1.5–4.5)
Glucose: 128 mg/dL — ABNORMAL HIGH (ref 65–99)
Potassium: 2.9 mmol/L — ABNORMAL LOW (ref 3.5–5.2)
Sodium: 142 mmol/L (ref 134–144)
Total Protein: 5.5 g/dL — ABNORMAL LOW (ref 6.0–8.5)

## 2018-10-02 NOTE — Telephone Encounter (Signed)
[place], Will you review labs and advise me what to tell patient? Thanks!

## 2018-10-02 NOTE — Progress Notes (Signed)
Patient ID: [name] Meyer., male   DOB: [birth year], 38 y.o.   MRN: [id number]       Chief Complaint:  Sickle cell disease, right iliopsoas abscess  Referring Physician(s): Allred,Darrell K  History of Present Illness: [name] Leblanc. is a 38 y.o. male with sickle cell disease and recently admitted for fever and pain.  CT demonstrated right iliopsoas abscess.  This underwent percutaneous drainage.  He returns for outpatient drain follow-up.  Repeat imaging with CT today demonstrates Resolved abscess right iliopsoas muscle.  Stable drain catheter position.  Minor amount of soft tissue thickening in the right iliopsoas muscle remains without significant fluid component by noncontrast imaging.  No interval fevers.  He reports minimal output.  He has not been on antibiotics as an outpatient.  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   Noted in noted from Hematology notes at [facility]    "take RX qd to prevent them" (09/25/2012)   Opioid dependence with withdrawal ([place]) 08/30/2012   Sickle cell anemia (HCC)    Sickle cell crisis ([place]) 09/25/2012   Sickle cell nephropathy ([place]) 07/10/2012   Sinus tachycardia    Tachycardia with heart rate 121-140 beats per minute with ambulation 08/04/2016    Past Surgical History:  Procedure Laterality Date   ABCESS DRAINAGE     CHOLECYSTECTOMY  ~ 2012    COLONOSCOPY N/A 04/23/2017   Procedure: COLONOSCOPY;  Surgeon: Irene Shipper, MD;  Location: [facility] ENDOSCOPY;  Service: Endoscopy;  Laterality: N/A;   IR FLUORO GUIDE CV LINE RIGHT  12/17/2016   IR RADIOLOGIST EVAL & MGMT  10/02/2018   IR REMOVAL TUN CV CATH W/O FL  12/21/2016   IR US GUIDE VASC ACCESS RIGHT  12/17/2016   spleenectomy      Allergies: Nsaids and Morphine and related  Medications: Prior to Admission medications   Medication Sig Start Date End Date Taking? Authorizing Provider  amoxicillin-clavulanate (AUGMENTIN) 875-125 MG tablet Take 1 tablet by mouth 2 (two) times daily for 14 days. Patient not taking: Reported on 10/01/2018 09/28/18 10/12/18  Elwyn Reach, MD  cholecalciferol (VITAMIN D3) 25 MCG (1000 UT) tablet Take 2,000 Units by mouth daily.    [provider]  CVS VITAMIN B12 1000 MCG tablet Take 2,000 mcg by mouth daily. 06/21/18   [provider]  folic acid (FOLVITE) 1 MG tablet Take 1 tablet (1 mg total) daily by mouth. 12/22/16   Leana Gamer, MD  hydroxyurea (HYDREA) 500 MG capsule TAKE 3 CAPSULES BY MOUTH DAILY Patient taking differently: Take 1,500 mg by mouth daily.  06/19/18   Lanae Boast, FNP  nortriptyline (PAMELOR) 25 MG capsule Take 1 capsule (25 mg total) by mouth at bedtime as needed for sleep. 06/10/18   Donnamae Jude, MD  oxyCODONE (OXYCONTIN) 15 mg 12 hr tablet Take 1 tablet (15  mg total) by mouth every 12 (twelve) hours. 08/30/18   [name], FNP  Oxycodone HCl 10 MG TABS Take 1 tablet (10 mg total) by mouth every 6 (six) hours as needed. Patient taking differently: Take 10 mg by mouth every 6 (six) hours as needed (pain).  08/30/18   [name], FNP  predniSONE (DELTASONE) 20 MG tablet Take 20 mg by mouth 3 (three) times daily.  06/26/18   [provider]     Family History  Problem Relation Age of Onset   Breast cancer Mother     Social History   Socioeconomic History   Marital status:  Single    Spouse name: Not on file   Number of children: Not on file   Years of education: Not on file   Highest education level: Not on file  Occupational History   Not on file  Social Needs   Financial resource strain: Not on file   Food insecurity    Worry: Not on file    Inability: Not on file   Transportation needs    Medical: Not on file    Non-medical: Not on file  Tobacco Use   Smoking status: Current Some Day Smoker    Packs/day: 0.10    Types: Cigarettes   Smokeless tobacco: Never Used   Tobacco comment: 09/25/2012 "I don't buy cigarettes; bum one from friends q now and then"  Substance and Sexual Activity   Alcohol use: Yes    Alcohol/week: 0.0 standard drinks    Frequency: Never    Comment: Once a month   Drug use: Not Currently    Types: Marijuana    Comment: Every 2-3 weeks    Sexual activity: Yes  Lifestyle   Physical activity    Days per week: Not on file    Minutes per session: Not on file   Stress: Not on file  Relationships   Social connections    Talks on phone: Not on file    Gets together: Not on file    Attends religious service: Not on file    Active member of club or organization: Not on file    Attends meetings of clubs or organizations: Not on file    Relationship status: Not on file  Other Topics Concern   Not on file  Social History Narrative    Lives alone in [place].   Back at school, studying Public relations account executive. Unemployed.    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     Review of Systems: A 12 point ROS discussed and pertinent positives are indicated in the HPI above.  All other systems are negative.  Review of Systems  Vital Signs: BP (!) 138/93 (BP Location: Right Arm)    Pulse 67    Temp 97.8 F (36.6 C)    SpO2 100%   Physical Exam Constitutional:       General: He is not in acute distress.    Appearance: He is not toxic-appearing.  Eyes:     General: No scleral icterus.    Conjunctiva/sclera: Conjunctivae normal.  Abdominal:     General: Abdomen is flat. There is no distension.     Palpations: Abdomen is soft.     Comments: Right lower quadrant drain catheter site clean, dry and intact.  Minimal exudative output in the suction  bulb.      Imaging: Dg Chest Port 1 View  Result Date: 09/23/2018 CLINICAL DATA:  Chest pain, shortness of breath, history of sickle cell EXAM: PORTABLE CHEST 1 VIEW COMPARISON:  Multiple priors, most recently radiograph July 22, 2018 FINDINGS: Cardiomegaly similar to prior studies. Basilar bronchiectatic changes are present on prior studies. There is increasing opacity in the mesial bases. No pneumothorax. No effusion. Osteonecrosis of the right humeral head and sclerotic endplate changes of the vertebrae similar to prior studies and compatible with patient's known sickle cell disease. IMPRESSION: Increasing opacity in the mesial bases, which could represent developing pneumonia or atelectasis. Electronically Signed   By: Lovena Le M.D.   On: 09/23/2018 14:28   Ct Renal Stone Study  Addendum Date: 09/23/2018   ADDENDUM REPORT: 09/23/2018 15:25 ADDENDUM: Critical Value/emergent results were called by telephone at the time of interpretation on 09/23/2018 at 3:24 pm to Dr. Pryor Curia , who verbally acknowledged these results. Electronically Signed   By: Lowella Grip III M.D.   On: 09/23/2018 15:25   Addendum Date: 09/23/2018   ADDENDUM REPORT: 09/23/2018 15:20 ADDENDUM: There were technical difficulties regarding the initial dictation. The corrected remainder of the report is as follows: There is an apparent iliacus muscle abscess at the level of the mid right sacrum measuring 7.7 x 6.7 x 3.2 cm. There is bony reactive change in this area. There is apparent reflux of fluid into the distal esophagus. Electronically  Signed   By: Lowella Grip III M.D.   On: 09/23/2018 15:20   Result Date: 09/23/2018 CLINICAL DATA:  Right flank pain.  Sickle cell disease. EXAM: CT ABDOMEN AND PELVIS WITHOUT CONTRAST TECHNIQUE: Multidetector CT imaging of the abdomen and pelvis was performed following the standard protocol without oral or IV contrast. COMPARISON:  April 19, 2017 FINDINGS: Lower chest: There is scarring with bronchiectatic change in the lung bases. There is no lung base edema or consolidation. Hepatobiliary: No focal liver lesions are evident on this noncontrast enhanced study. Gallbladder is absent. There is no appreciable biliary duct dilatation. Pancreas: No pancreatic mass or inflammatory focus. Spleen: Evidence of splenic autoinfarction. Minimal splenic tissue, calcified. Adrenals/Urinary Tract: Adrenals appear normal bilaterally. Kidneys bilaterally show no evident mass or hydronephrosis. There is no evident renal or ureteral calculus on either side. Urinary bladder is distended. Urinary bladder wall thickness is within normal limits. Stomach/Bowel: There is no appreciable bowel wall or mesenteric thickening. No evident bowel obstruction. The terminal ileum appears unremarkable. There is no free air or portal venous air. Vascular/Lymphatic: There is no abdominal aortic aneurysm. No vascular lesions are evident on this noncontrast enhanced study. No adenopathy is appreciable in the abdomen or pelvis. Reproductive: Prostate and seminal vesicles are normal in size and contour. No evident pelvic mass. Other: Appendix appears normal. No abscess or ascites is evident in the abdomen or pelvis. Musculoskeletal: Bony structures show sclerosis indicative of known sickle cell anemia. There is a degree of avascular necrosis in each femoral head without flattening of the femoral heads. No intramuscular or abdominal wall lesions. Scarring is noted in the umbilical region. IMPRESSION: 1. No evident renal or ureteral calculus. No  hydronephrosis. Urinary bladder is distended without wall thickening. This degree of distention raises concern for a degree of potential urinary bladder outlet obstruction. 2. No bowel wall thickening or bowel obstruction. No abscess in the abdomen or pelvis. Appendix appears normal. 3.  Bony changes indicative of sickle cell disease. 4.  Evidence of prior splenic  autoinfarction. 5.  Gallbladder absent. 6.  Scarring and bronchiectatic change in the lung bases. 7.  Periumbilical region scarring anteriorly. Electronically Signed: By: Lowella Grip III M.D. On: 09/23/2018 14:45   Ct Image Guided Drainage By Percutaneous Catheter  Result Date: 09/24/2018 INDICATION: 38 year old with fluid collection involving the right iliacus/iliopsoas muscle. Concern for abscess. EXAM: CT GUIDED DRAINAGE OF RIGHT ILIOPSOAS ABSCESS MEDICATIONS: Moderate sedation ANESTHESIA/SEDATION: 2.0 mg IV Versed 100 mcg IV Fentanyl Moderate Sedation Time:  20 minutes The patient was continuously monitored during the procedure by the interventional radiology nurse under my direct supervision. COMPLICATIONS: None immediate. TECHNIQUE: Informed written consent was obtained from the patient after a thorough discussion of the procedural risks, benefits and alternatives. All questions were addressed. A timeout was performed prior to the initiation of the procedure. PROCEDURE: Supine images of the lower abdomen were obtained with CT. The fluid collection involving the right iliacus and right iliopsoas were targeted. Right side of the abdomen was prepped with chlorhexidine and sterile field was created. Skin and soft tissues were anesthetized with 1% lidocaine. Using CT guidance, 18 gauge trocar needle was directed into the fluid collection. Yellow purulent fluid was aspirated. Stiff Amplatz wire was advanced into the collection and the tract was dilated to accommodate a 10.2 [place] drain. Fluid was collected for culture. Catheter was attached to a  suction bulb and sutured to skin. Dressing was placed. FINDINGS: Again noted is a low-density collection involving the right iliacus and right iliopsoas muscle. Yellow purulent fluid was removed from the collection and compatible with an abscess. Drain placed within the iliopsoas abscess. IMPRESSION: CT-guided placement of a drainage catheter within the right iliopsoas abscess. Electronically Signed   By: Markus Daft M.D.   On: 09/24/2018 12:44   Ir Radiologist Eval & Mgmt  Result Date: 10/02/2018 Please refer to notes tab for details about interventional procedure. (Op Note)   Labs:  CBC: Recent Labs    09/24/18 0438 09/25/18 0531 09/28/18 0746 10/01/18 1430  WBC 22.0* 16.1* 21.3* 18.3*  HGB 7.1* 8.7* 6.0* 7.4*  HCT 22.9* 26.1* 19.3* 22.6*  PLT 325 255 394 391    COAGS: Recent Labs    03/07/18 0329 09/24/18 0438  INR 1.04 1.0  APTT 34  --     BMP: Recent Labs    09/23/18 1140 09/24/18 0438  09/26/18 0349 09/27/18 0604 09/28/18 0746 10/01/18 1430  NA 137 132*  --   --   --  133* 142  K 4.2 4.5  --   --   --  4.7 2.9*  CL 111 107  --   --   --  110 97  CO2 16* 16*  --   --   --  13* 24  GLUCOSE 96 88  --   --   --  112* 128*  BUN 39* 30*  --   --   --  67* 42*  CALCIUM 8.5* 8.3*  --   --   --  9.4 8.4*  CREATININE 2.46* 2.27*   < > 3.46* 3.38* 3.91* 2.66*  GFRNONAA 33* 36*   < > 22* 22* 19* 30*  GFRAA 38* 42*   < > 25* 26* 22* 34*   < > = values in this interval not displayed.    LIVER FUNCTION TESTS: Recent Labs    08/27/18 1535 09/23/18 1140 09/24/18 0438 10/01/18 1430  BILITOT 0.8 0.7 1.0 0.8  AST 34 20 28 45*  ALT 45* 26 33  50*  ALKPHOS 184* 138* 166* 153*  PROT 5.0* 6.6 6.3* 5.5*  ALBUMIN 3.0* 2.9* 2.7* 3.4*     Assessment and Plan:  Sickle cell disease, fever, pain crisis.  Patient was found to have a right iliopsoas abscess status post percutaneous drain.  Culture grew Bacteroides intermedius.  Patient was supposed to be discharged on  Augmentin 875 twice daily however he has not been on antibiotics for the past 2 weeks.  He states he did not get a prescription for this.  He has been flushing the drain daily with significant decrease in output.  No interval fevers.  He feels much better.  No current pain.  Plan: He had outpatient labs yesterday.  His white count remains elevated.  I gave him a prescription today for Augmentin 875 twice daily for 14 days.  He is to continue daily flushing.  Return to clinic in 14 days without repeat imaging.  Once he has completed antibiotics drain can be removed.   Electronically Signed: Greggory Keen 10/02/2018, 9:48 AM   I spent a total of  40 Minutes   in face to face in clinical consultation, greater than 50% of which was counseling/coordinating care for this patient with a right lower quadrant iliopsoas abscess drain

## 2018-10-02 NOTE — Progress Notes (Signed)
Established Patient Office Visit  Subjective:  Patient ID: [name] Meyer., male    DOB: 24-Dec-1980  Age: 38 y.o. MRN: [id number]  CC:  Chief Complaint  Patient presents with  . Sickle Cell Anemia  . Medication Refill    oxycodone 10 and 15     HPI [name] A Marcial Pacas., a 38 year old male with a medical history significant for sickle cell disease type SS, chronic pain syndrome, opiate dependence, history of anemia of chronic disease, history of polysubstance abuse, sickle cell related nephropathy, and iliopsoas abscess presents for post hospital follow-up and medication management. Patient was previously admitted to inpatient services on 09/23/2018 with complaints of increased pain to right lower back.  In ER, patient underwent CT renal stone study, which showed an apparent iliopsoas muscle abscess o at the level of the mid right sacrum measuring 7 x 3 cm.  At that time, WBCs were elevated at 22,000 and patient was febrile.  Patient was started on IV antibiotics during admission.  Oral antibiotics were continued per interventional radiology, Augmentin 875-125 mg twice daily.  Drain continues to be in place, purulent drainage present (minimal).  Patient will be reevaluated by interventional radiology to remove drain in 1 week. Patient also has a history of sickle cell disease and chronic pain syndrome.  Patient has been out of home pain medications over the past several weeks.  Patient's last drug screen was positive for cocaine, therefore pain medications were not written by PCP.  Patient complains of low back pain today.  Pain intensity 7/10 characterized as constant, throbbing, and occasionally sharp.  Patient denies headache, chest pain, fever, dizziness, dysuria, nausea, vomiting, or diarrhea.  Patient also has a history of sickle cell related nephropathy.  He is followed by [facility] for this problem.  He is evaluated monthly and is currently taking prednisone  consistently.   Past Medical History:  Diagnosis Date  . Arachnoid Cyst of brain bilaterally    "2 really small ones in the back of my head; inside; saw them w/MRI" (09/25/2012)  . Bacterial pneumonia ~ 2012   "caught it here in the hospital" (09/25/2012)  . Chronic kidney disease    "from my sickle cell" (09/25/2012)  . CKD (chronic kidney disease), stage II   . GERD (gastroesophageal reflux disease)    "after I eat alot of spicey foods" (09/25/2012)  . Gynecomastia, male 07/10/2012  . History of blood transfusion    "always related to sickle cell crisis" (09/25/2012)  . 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   Noted in noted from [facility] at [facility]  . Migraines    "take RX qd to prevent them" (09/25/2012)  . Opioid dependence with withdrawal ([place]) 08/30/2012  . Sickle cell anemia (HCC)   . Sickle cell crisis ([place]) 09/25/2012  . Sickle cell nephropathy ([place]) 07/10/2012  . Sinus tachycardia   . Tachycardia with heart rate 121-140 beats per minute with ambulation 08/04/2016    Past Surgical History:  Procedure Laterality Date  . ABCESS DRAINAGE    . CHOLECYSTECTOMY  ~ 2012  . COLONOSCOPY N/A 04/23/2017   Procedure: COLONOSCOPY;  Surgeon: Irene Shipper, MD;  Location: [facility];  Service: Endoscopy;  Laterality: N/A;  . IR FLUORO GUIDE CV LINE RIGHT  12/17/2016  . IR REMOVAL TUN CV CATH W/O FL  12/21/2016  . IR US GUIDE VASC ACCESS RIGHT  12/17/2016  . spleenectomy      Family History  Problem Relation Age of  Onset  . Breast cancer Mother     Social History   Socioeconomic History  . Marital status: Single    Spouse name: Not on file  . Number of children: Not on file  . Years of education: Not on file  . Highest education level: Not on file  Occupational History  . Not on file  Social Needs  . Financial resource strain: Not on file  . Food insecurity    Worry: Not on file    Inability: Not on file  . Transportation needs    Medical: Not on file     Non-medical: Not on file  Tobacco Use  . Smoking status: Current Some Day Smoker    Packs/day: 0.10    Types: Cigarettes  . Smokeless tobacco: Never Used  . Tobacco comment: 09/25/2012 "I don't buy cigarettes; bum one from friends q now and then"  Substance and Sexual Activity  . Alcohol use: Yes    Alcohol/week: 0.0 standard drinks    Frequency: Never    Comment: Once a month  . Drug use: Not Currently    Types: Marijuana    Comment: Every 2-3 weeks   . Sexual activity: Yes  Lifestyle  . Physical activity    Days per week: Not on file    Minutes per session: Not on file  . Stress: Not on file  Relationships  . Social Herbalist on phone: Not on file    Gets together: Not on file    Attends religious service: Not on file    Active member of club or organization: Not on file    Attends meetings of clubs or organizations: Not on file    Relationship status: Not on file  . Intimate partner violence    Fear of current or ex partner: Not on file    Emotionally abused: Not on file    Physically abused: Not on file    Forced sexual activity: Not on file  Other Topics Concern  . Not on file  Social History Narrative    Lives alone in [place].   Back at school, studying Public relations account executive. Unemployed.    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     Outpatient Medications Prior to Visit  Medication Sig Dispense Refill  . cholecalciferol (VITAMIN D3) 25 MCG (1000 UT) tablet Take 2,000 Units by mouth daily.    . CVS VITAMIN B12 1000 MCG tablet Take 2,000 mcg by mouth daily.    . folic acid (FOLVITE) 1 MG tablet Take 1 tablet (1 mg total) daily by mouth. 30 tablet 2  . hydroxyurea (HYDREA) 500 MG capsule TAKE 3 CAPSULES BY MOUTH DAILY (Patient taking differently: Take 1,500 mg by mouth daily. ) 270 capsule 1  . nortriptyline  (PAMELOR) 25 MG capsule Take 1 capsule (25 mg total) by mouth at bedtime as needed for sleep. 90 capsule 3  . oxyCODONE (OXYCONTIN) 15 mg 12 hr tablet Take 1 tablet (15 mg total) by mouth every 12 (twelve) hours. 60 tablet 0  . Oxycodone HCl 10 MG TABS Take 1 tablet (10 mg total) by mouth every 6 (six) hours as needed. (Patient taking differently: Take 10 mg by mouth every 6 (six) hours as needed (pain). ) 30 tablet 0  . predniSONE (DELTASONE) 20 MG tablet Take 20 mg by  mouth 3 (three) times daily.     Marland Kitchen amoxicillin-clavulanate (AUGMENTIN) 875-125 MG tablet Take 1 tablet by mouth 2 (two) times daily for 14 days. (Patient not taking: Reported on 10/01/2018) 28 tablet 0   No facility-administered medications prior to visit.     Allergies  Allergen Reactions  . Nsaids Other (See Comments)    Kidney disease  . Morphine And Related Other (See Comments)    "Real bad headaches"     ROS Review of Systems    Objective:    Physical Exam  BP (!) 124/91 (BP Location: Left Arm, Patient Position: Sitting, Cuff Size: Normal)   Pulse (!) 129   Temp 99 F (37.2 C) (Oral)   Resp 14   Ht 5\' 3"  (1.6 m)   Wt 132 lb (59.9 kg)   SpO2 100%   BMI 23.38 kg/m  Wt Readings from Last 3 Encounters:  10/01/18 132 lb (59.9 kg)  09/27/18 119 lb 11.4 oz (54.3 kg)  08/27/18 127 lb (57.6 kg)     Health Maintenance Due  Topic Date Due  . INFLUENZA VACCINE  09/14/2018    There are no preventive care reminders to display for this patient.  Lab Results  Component Value Date   TSH 2.860 05/11/2017   Lab Results  Component Value Date   WBC 21.3 (H) 09/28/2018   HGB 6.0 (LL) 09/28/2018   HCT 19.3 (L) 09/28/2018   MCV 113.5 (H) 09/28/2018   PLT 394 09/28/2018   Lab Results  Component Value Date   NA 133 (L) 09/28/2018   K 4.7 09/28/2018   CO2 13 (L) 09/28/2018   GLUCOSE 112 (H) 09/28/2018   BUN 67 (H) 09/28/2018   CREATININE 3.91 (H) 09/28/2018   BILITOT 1.0 09/24/2018   ALKPHOS 166 (H)  09/24/2018   AST 28 09/24/2018   ALT 33 09/24/2018   PROT 6.3 (L) 09/24/2018   ALBUMIN 2.7 (L) 09/24/2018   CALCIUM 9.4 09/28/2018   ANIONGAP 10 09/28/2018   Lab Results  Component Value Date   CHOL 145 03/07/2018   Lab Results  Component Value Date   HDL 65 03/07/2018   Lab Results  Component Value Date   LDLCALC 30 03/07/2018   Lab Results  Component Value Date   TRIG 251 (H) 03/07/2018   Lab Results  Component Value Date   CHOLHDL 2.2 03/07/2018   Lab Results  Component Value Date   HGBA1C <4.2 (L) 03/07/2018      Assessment & Plan:   Problem List Items Addressed This Visit      Cardiovascular and Mediastinum   Sickle cell nephropathy ([place]) - Primary (Chronic)   Relevant Orders   Urinalysis Dipstick (Completed)     Musculoskeletal and Integument   Iliopsoas abscess on right (HCC)     Other   Leukocytosis   Relevant Orders   CBC with Differential (Completed)   Chronic pain syndrome   Relevant Orders   CBC with Differential (Completed)   Comprehensive metabolic panel (Completed)   [id number] 11+Oxyco+Alc+Crt-Bund (Completed)   Cannabinoid Conf, Ur (Completed)   Oxycodone/Oxymorphone, Confirm (Completed)    Other Visit Diagnoses    Anemia of chronic disease       Relevant Orders   CBC with Differential (Completed)     1. Sickle cell nephropathy with crisis [facility]) Patient is followed by [facility] for sickle cell related nephropathy.  He is aware of all upcoming appointments.  Creatinine prior to hospital discharge was 3.91, which  is above patient's baseline.  Will recheck creatinine level on today.  Will fax results to [facility]. - Urinalysis Dipstick  2. Chronic pain syndrome Patient has a history of chronic pain syndrome.  He typically takes oxycodone and OxyContin for daily pain control.  However patient's previous drug screen was positive for cocaine.  Patient states that he is not use cocaine since prior to previous  hospital admission.  Informed patient that drug screen would have to be reviewed and be negative for any illicit substances prior to prescribing pain medication.  He was advised to avoid all nephrotoxic medications such as ibuprofen and naproxen.  Recommend Tylenol 500 mg every 4 hours as needed for pain control.  Will send opiate prescriptions to patient's pharmacy after reviewing urine drug screen.  Reviewed PDMP, no inconsistencies noted. - CBC with Differential - Comprehensive metabolic panel - [id number] - Cannabinoid Conf, Ur - Oxycodone/Oxymorphone, Confirm  3. Anemia of chronic disease - CBC with Differential  4. Iliopsoas abscess on right [facility]) Patient to continue Augmentin 875-125 per interventional radiology.  Previous WBCs were elevated, will repeat CBC on today.  Minimal purulent drainage to catheter bulb, will defer to IR.  Patient states that he has an upcoming appointment to remove drain.  5. Leukocytosis, unspecified type - CBC with Differential  Follow-up: No follow-ups on file.    [name]  APRN, MSN, FNP-C Patient [facility] [address] [place], [place] [address] [phone]

## 2018-10-03 ENCOUNTER — Telehealth: Payer: Self-pay | Admitting: Family Medicine

## 2018-10-03 ENCOUNTER — Telehealth: Payer: Self-pay

## 2018-10-03 NOTE — Telephone Encounter (Signed)
Juwaan Spolar, a 38 year old male with a medical history significant for sickle cell disease, chronic pain syndrome, opiate dependence, history of anemia of chronic disease, iliopsoas abscess, history of cocaine use called concerning laboratory values.  Patient informed that WBCs continue to be elevated.  Patient was assessed by interventional radiology on 10/02/2018 to remove drain, infection appeared to be present.  Patient was not discharged home with antibiotics.  Augmentin 875-125 mg was started. Also, patient informed that hemoglobin has improved.  Patient was transfused 1 unit of packed red blood cells during recent hospital admission.  Patient's creatinine level has improved to baseline, he is followed by outpatient nephrology.  Patient is also inquiring about opiate pain medications.  During office visit on 10/01/2018, a urine drug screen was obtained.  Patient informed that drug screen will need to be reviewed prior to prescribing opiate medications.  Drug screen has not been resulted as of 10/03/2018.  If drug screen is negative for illicit drugs, will send patient's prescription to his pharmacy.  Patient denies recent cocaine use, however endorses every day marijuana use.  Reviewed PDMP, no inconsistencies noted.   [name]  APRN, MSN, FNP-C Patient [facility] [address] [place], [place] [address] [phone]

## 2018-10-03 NOTE — Telephone Encounter (Signed)
Called and spoke with patient, advised that urine results are not available yet.

## 2018-10-04 ENCOUNTER — Telehealth: Payer: Self-pay | Admitting: Family Medicine

## 2018-10-04 NOTE — Telephone Encounter (Signed)
laur

## 2018-10-05 ENCOUNTER — Other Ambulatory Visit: Payer: Self-pay | Admitting: Family Medicine

## 2018-10-05 DIAGNOSIS — D571 Sickle-cell disease without crisis: Secondary | ICD-10-CM

## 2018-10-05 DIAGNOSIS — G894 Chronic pain syndrome: Secondary | ICD-10-CM

## 2018-10-05 LAB — DRUG SCREEN 764883 11+OXYCO+ALC+CRT-BUND
Amphetamines, Urine: NEGATIVE ng/mL
BENZODIAZ UR QL: NEGATIVE ng/mL
Barbiturate: NEGATIVE ng/mL
Cocaine (Metabolite): NEGATIVE ng/mL
Creatinine: 93.8 mg/dL (ref 20.0–300.0)
Ethanol: NEGATIVE %
Meperidine: NEGATIVE ng/mL
Methadone Screen, Urine: NEGATIVE ng/mL
OPIATE SCREEN URINE: NEGATIVE ng/mL
Phencyclidine: NEGATIVE ng/mL
Propoxyphene: NEGATIVE ng/mL
Tramadol: NEGATIVE ng/mL
pH, Urine: 6.9 (ref 4.5–8.9)

## 2018-10-05 LAB — OXYCODONE/OXYMORPHONE, CONFIRM
OXYCODONE/OXYMORPH: POSITIVE — AB
OXYCODONE: NEGATIVE
OXYMORPHONE (GC/MS): 1745 ng/mL
OXYMORPHONE: POSITIVE — AB

## 2018-10-05 LAB — CANNABINOID CONFIRMATION, UR
CANNABINOIDS: POSITIVE — AB
Carboxy THC GC/MS Conf: 46 ng/mL

## 2018-10-05 MED ORDER — OXYCODONE HCL ER 15 MG PO T12A: 15.0000 mg | EXTENDED_RELEASE_TABLET | Freq: Two times a day (BID) | ORAL | 0 refills | Status: DC

## 2018-10-05 MED ORDER — OXYCODONE HCL 10 MG PO TABS: 10.0000 mg | ORAL_TABLET | Freq: Four times a day (QID) | ORAL | 0 refills | Status: DC | PRN

## 2018-10-05 NOTE — Progress Notes (Signed)
Meds ordered this encounter  Medications  . oxyCODONE (OXYCONTIN) 15 mg 12 hr tablet    Sig: Take 1 tablet (15 mg total) by mouth every 12 (twelve) hours.    Dispense:  60 tablet    Refill:  0    Order Specific Question:   Supervising Provider    Answer:   [name] [id number]  . Oxycodone HCl 10 MG TABS    Sig: Take 1 tablet (10 mg total) by mouth every 6 (six) hours as needed.    Dispense:  60 tablet    Refill:  0    Order Specific Question:   Supervising Provider    Answer:   [name] [id number]    [name]  APRN, MSN, FNP-C Patient [facility] [address] [place], [place] [address] [phone]

## 2018-10-05 NOTE — Patient Instructions (Signed)
Follow-up in interventional radiology as scheduled. Will send opiate pain medications to pharmacy upon reviewing urine drug screen. Resume all home medications. Continue to hydrate with 64 ounces of fluid. We will follow-up for sickle cell disease in 1 month. Patient reminded to utilize sickle cell day infusion center for acute pain crisis. Follow-up with nephrology for sickle cell related nephropathy as scheduled.

## 2018-10-15 ENCOUNTER — Ambulatory Visit
Admission: RE | Admit: 2018-10-15 | Discharge: 2018-10-15 | Disposition: A | Discharge status: Home / Self Care | Payer: Medicare Other | Source: Ambulatory Visit | Attending: Internal Medicine | Admitting: Internal Medicine

## 2018-10-15 DIAGNOSIS — K6812 Psoas muscle abscess: Secondary | ICD-10-CM | POA: Diagnosis not present

## 2018-10-15 HISTORY — PX: IR RADIOLOGIST EVAL & MGMT: IMG5224

## 2018-10-15 NOTE — Progress Notes (Signed)
Chief Complaint: Follow up after drainage of right iliopsoas abscess.  History of Present Illness: [name] [name]. is a 38 y.o. male status post percutaneous catheter drainage of a right iliopsoas abscess on 09/24/2018.  CT on 10/02/2018 demonstrated resolution of the abscess.  However, the patient had not been taking prescribed antibiotics and was placed on Augmentin for the last 2 weeks.  He just finished his last antibiotic yesterday.  Output from the drainage catheter has been minimal and approximately 5 mL/day of turbid fluid.  He denies fever or significant pain.  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   Noted in noted from Hematology notes at [place]    "take RX qd to prevent them" (09/25/2012)   Opioid dependence with withdrawal ([place]) 08/30/2012   Sickle cell anemia ([facility])    Sickle cell crisis ([place]) 09/25/2012   Sickle cell nephropathy ([place]) 07/10/2012   Sinus tachycardia    Tachycardia with heart rate 121-140 beats per minute with ambulation 08/04/2016    Past Surgical History:  Procedure Laterality Date   ABCESS DRAINAGE     CHOLECYSTECTOMY  ~ 2012   COLONOSCOPY N/A 04/23/2017   Procedure: COLONOSCOPY;  Surgeon: Irene Shipper, MD;  Location: [facility];  Service: Endoscopy;  Laterality: N/A;   IR FLUORO GUIDE CV LINE RIGHT  12/17/2016   IR RADIOLOGIST EVAL & MGMT  10/02/2018     IR RADIOLOGIST EVAL & MGMT  10/15/2018   IR REMOVAL TUN CV CATH W/O FL  12/21/2016   IR US GUIDE VASC ACCESS RIGHT  12/17/2016   spleenectomy      Allergies: Nsaids and Morphine and related  Medications: Prior to Admission medications   Medication Sig Start Date End Date Taking? Authorizing Provider  cholecalciferol (VITAMIN D3) 25 MCG (1000 UT) tablet Take 2,000 Units by mouth daily.    [provider]  [facility] VITAMIN B12 1000 MCG tablet Take 2,000 mcg by mouth daily. 06/21/18   [provider]  folic acid (FOLVITE) 1 MG tablet Take 1 tablet (1 mg total) daily by mouth. 12/22/16   Leana Gamer, MD  hydroxyurea (HYDREA) 500 MG capsule TAKE 3 CAPSULES BY MOUTH DAILY Patient taking differently: Take 1,500 mg by mouth daily.  06/19/18   Lanae Boast, FNP  nortriptyline (PAMELOR) 25 MG capsule Take 1 capsule (25 mg total) by mouth at bedtime as needed for sleep. 06/10/18   Donnamae Jude, MD  oxyCODONE (OXYCONTIN) 15 mg 12 hr tablet Take 1 tablet (15 mg total) by mouth every 12 (twelve) hours. 10/05/18   [name], FNP  Oxycodone HCl 10 MG TABS Take 1 tablet (10 mg total) by mouth every 6 (six) hours as needed. 10/05/18   [name], FNP  predniSONE (DELTASONE) 20 MG tablet Take 20 mg by mouth 3 (three) times daily.  06/26/18  [provider]     Family History  Problem Relation Age of Onset   Breast cancer Mother     Social History   Socioeconomic History   Marital status: Single    Spouse name: Not on file   Number of children: Not on file   Years of education: Not on file   Highest education level: Not on file  Occupational History   Not on file  Social Needs   Financial resource strain: Not on file   Food insecurity    Worry: Not on file    Inability: Not on file   Transportation needs    Medical: Not on file    Non-medical: Not on file  Tobacco Use   Smoking status: Current Some Day Smoker    Packs/day: 0.10     Types: Cigarettes   Smokeless tobacco: Never Used   Tobacco comment: 09/25/2012 "I don't buy cigarettes; bum one from friends q now and then"  Substance and Sexual Activity   Alcohol use: Yes    Alcohol/week: 0.0 standard drinks    Frequency: Never    Comment: Once a month   Drug use: Not Currently    Types: Marijuana    Comment: Every 2-3 weeks    Sexual activity: Yes  Lifestyle   Physical activity    Days per week: Not on file    Minutes per session: Not on file   Stress: Not on file  Relationships   Social connections    Talks on phone: Not on file    Gets together: Not on file    Attends religious service: Not on file    Active member of club or organization: Not on file    Attends meetings of clubs or organizations: Not on file    Relationship status: Not on file  Other Topics Concern   Not on file  Social History Narrative    Lives alone in [place].   Back at school, studying Public relations account executive. Unemployed.    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     Review of Systems: A 12 point ROS discussed and pertinent positives are indicated in the HPI above.  All other systems are negative.  Review of Systems  Constitutional: Negative.   Respiratory: Negative.   Cardiovascular: Negative.   Gastrointestinal: Negative.   Genitourinary: Negative.   Musculoskeletal: Negative.   Neurological: Negative.     Vital Signs: BP 119/81 (BP Location: Right Arm)    Pulse (!) 110    Temp 98.4 F (36.9 C)    SpO2 99%   Physical Exam Vitals signs reviewed.  Constitutional:      General: He is not in acute distress.    Appearance: Normal appearance. He is not ill-appearing, toxic-appearing or diaphoretic.  Abdominal:     General: Abdomen is flat. There is no distension.     Palpations: Abdomen is soft.     Tenderness: There  is no abdominal tenderness. There is no guarding or rebound.     Comments: Drain exit site clean and dry without discharge, erythema or significant tenderness.  Musculoskeletal:        General: No swelling.  Skin:    General: Skin is warm and dry.  Neurological:     General: No focal deficit present.     Mental Status: He is alert  and oriented to person, place, and time.     Imaging: Ct Abdomen Pelvis Wo Contrast  Result Date: 10/02/2018 CLINICAL DATA:  Right iliacus abscess, status post percutaneous drain, sickle cell disease EXAM: CT ABDOMEN AND PELVIS WITHOUT CONTRAST TECHNIQUE: Multidetector CT imaging of the abdomen and pelvis was performed following the standard protocol without IV contrast. COMPARISON:  09/23/2018 FINDINGS: Lower chest: Chronic bibasilar atelectasis/scarring. No change in basilar aeration. No large effusion. No pericardial effusion. Hepatobiliary: No focal abnormality by noncontrast imaging. No biliary dilatation. Remote cholecystectomy. Pancreas: Unremarkable. No pancreatic ductal dilatation or surrounding inflammatory changes. Spleen: Chronic splenic artery infarction with calcification. Adrenals/Urinary Tract: Adrenal glands are unremarkable. Kidneys are normal, without renal calculi, focal lesion, or hydronephrosis. Bladder is unremarkable. Stomach/Bowel: Stomach is within normal limits. Appendix appears normal. No evidence of bowel wall thickening, distention, or inflammatory changes. Vascular/Lymphatic: Negative for aneurysm. No significant atherosclerotic process. No retroperitoneal hemorrhage or hematoma. No bulky. Reproductive: No significant finding by CT. Other: Interval right iliacus abscess drain placement. Stable drain catheter position. Trace residual adjacent fluid/muscular thickening. No new collections. Musculoskeletal: Chronic osseous changes sickle cell disease. Bilateral chronic hip AVN changes. No acute osseous finding. IMPRESSION: Nearly resolved right  iliacus abscess following percutaneous drain. No new collections. Stable CT without contrast. Electronically Signed   By: Jerilynn Mages.  Shick M.D.   On: 10/02/2018 11:47   Dg Chest Port 1 View  Result Date: 09/23/2018 CLINICAL DATA:  Chest pain, shortness of breath, history of sickle cell EXAM: PORTABLE CHEST 1 VIEW COMPARISON:  Multiple priors, most recently radiograph July 22, 2018 FINDINGS: Cardiomegaly similar to prior studies. Basilar bronchiectatic changes are present on prior studies. There is increasing opacity in the mesial bases. No pneumothorax. No effusion. Osteonecrosis of the right humeral head and sclerotic endplate changes of the vertebrae similar to prior studies and compatible with patient's known sickle cell disease. IMPRESSION: Increasing opacity in the mesial bases, which could represent developing pneumonia or atelectasis. Electronically Signed   By: Lovena Le M.D.   On: 09/23/2018 14:28   Ct Renal Stone Study  Addendum Date: 09/23/2018   ADDENDUM REPORT: 09/23/2018 15:25 ADDENDUM: Critical Value/emergent results were called by telephone at the time of interpretation on 09/23/2018 at 3:24 pm to Dr. Pryor Curia , who verbally acknowledged these results. Electronically Signed   By: Lowella Grip III M.D.   On: 09/23/2018 15:25   Addendum Date: 09/23/2018   ADDENDUM REPORT: 09/23/2018 15:20 ADDENDUM: There were technical difficulties regarding the initial dictation. The corrected remainder of the report is as follows: There is an apparent iliacus muscle abscess at the level of the mid right sacrum measuring 7.7 x 6.7 x 3.2 cm. There is bony reactive change in this area. There is apparent reflux of fluid into the distal esophagus. Electronically Signed   By: Lowella Grip III M.D.   On: 09/23/2018 15:20   Result Date: 09/23/2018 CLINICAL DATA:  Right flank pain.  Sickle cell disease. EXAM: CT ABDOMEN AND PELVIS WITHOUT CONTRAST TECHNIQUE: Multidetector CT imaging of the abdomen and  pelvis was performed following the standard protocol without oral or IV contrast. COMPARISON:  April 19, 2017 FINDINGS: Lower chest: There is scarring with bronchiectatic change in the lung bases. There is no lung base edema or consolidation. Hepatobiliary: No focal liver lesions are evident on this noncontrast enhanced study. Gallbladder is absent. There is no appreciable biliary duct dilatation. Pancreas: No pancreatic mass or inflammatory focus. Spleen: Evidence of splenic autoinfarction. Minimal splenic tissue, calcified.  Adrenals/Urinary Tract: Adrenals appear normal bilaterally. Kidneys bilaterally show no evident mass or hydronephrosis. There is no evident renal or ureteral calculus on either side. Urinary bladder is distended. Urinary bladder wall thickness is within normal limits. Stomach/Bowel: There is no appreciable bowel wall or mesenteric thickening. No evident bowel obstruction. The terminal ileum appears unremarkable. There is no free air or portal venous air. Vascular/Lymphatic: There is no abdominal aortic aneurysm. No vascular lesions are evident on this noncontrast enhanced study. No adenopathy is appreciable in the abdomen or pelvis. Reproductive: Prostate and seminal vesicles are normal in size and contour. No evident pelvic mass. Other: Appendix appears normal. No abscess or ascites is evident in the abdomen or pelvis. Musculoskeletal: Bony structures show sclerosis indicative of known sickle cell anemia. There is a degree of avascular necrosis in each femoral head without flattening of the femoral heads. No intramuscular or abdominal wall lesions. Scarring is noted in the umbilical region. IMPRESSION: 1. No evident renal or ureteral calculus. No hydronephrosis. Urinary bladder is distended without wall thickening. This degree of distention raises concern for a degree of potential urinary bladder outlet obstruction. 2. No bowel wall thickening or bowel obstruction. No abscess in the abdomen or  pelvis. Appendix appears normal. 3.  Bony changes indicative of sickle cell disease. 4.  Evidence of prior splenic autoinfarction. 5.  Gallbladder absent. 6.  Scarring and bronchiectatic change in the lung bases. 7.  Periumbilical region scarring anteriorly. Electronically Signed: By: Lowella Grip III M.D. On: 09/23/2018 14:45   Ct Image Guided Drainage By Percutaneous Catheter  Result Date: 09/24/2018 INDICATION: 38 year old with fluid collection involving the right iliacus/iliopsoas muscle. Concern for abscess. EXAM: CT GUIDED DRAINAGE OF RIGHT ILIOPSOAS ABSCESS MEDICATIONS: Moderate sedation ANESTHESIA/SEDATION: 2.0 mg IV Versed 100 mcg IV Fentanyl Moderate Sedation Time:  20 minutes The patient was continuously monitored during the procedure by the interventional radiology nurse under my direct supervision. COMPLICATIONS: None immediate. TECHNIQUE: Informed written consent was obtained from the patient after a thorough discussion of the procedural risks, benefits and alternatives. All questions were addressed. A timeout was performed prior to the initiation of the procedure. PROCEDURE: Supine images of the lower abdomen were obtained with CT. The fluid collection involving the right iliacus and right iliopsoas were targeted. Right side of the abdomen was prepped with chlorhexidine and sterile field was created. Skin and soft tissues were anesthetized with 1% lidocaine. Using CT guidance, 18 gauge trocar needle was directed into the fluid collection. Yellow purulent fluid was aspirated. Stiff Amplatz wire was advanced into the collection and the tract was dilated to accommodate a 10.2 [place] drain. Fluid was collected for culture. Catheter was attached to a suction bulb and sutured to skin. Dressing was placed. FINDINGS: Again noted is a low-density collection involving the right iliacus and right iliopsoas muscle. Yellow purulent fluid was removed from the collection and compatible with an abscess.  Drain placed within the iliopsoas abscess. IMPRESSION: CT-guided placement of a drainage catheter within the right iliopsoas abscess. Electronically Signed   By: Markus Daft M.D.   On: 09/24/2018 12:44   Ir Radiologist Eval & Mgmt  Result Date: 10/15/2018 Please refer to notes tab for details about interventional procedure. (Op Note)  Ir Radiologist Eval & Mgmt  Result Date: 10/02/2018 Please refer to notes tab for details about interventional procedure. (Op Note)   Labs:  CBC: Recent Labs    09/24/18 0438 09/25/18 0531 09/28/18 0746 10/01/18 1430  WBC 22.0* 16.1* 21.3* 18.3*  HGB 7.1* 8.7* 6.0* 7.4*  HCT 22.9* 26.1* 19.3* 22.6*  PLT 325 255 394 391    COAGS: Recent Labs    03/07/18 0329 09/24/18 0438  INR 1.04 1.0  APTT 34  --     BMP: Recent Labs    09/23/18 1140 09/24/18 0438  09/26/18 0349 09/27/18 0604 09/28/18 0746 10/01/18 1430  NA 137 132*  --   --   --  133* 142  K 4.2 4.5  --   --   --  4.7 2.9*  CL 111 107  --   --   --  110 97  CO2 16* 16*  --   --   --  13* 24  GLUCOSE 96 88  --   --   --  112* 128*  BUN 39* 30*  --   --   --  67* 42*  CALCIUM 8.5* 8.3*  --   --   --  9.4 8.4*  CREATININE 2.46* 2.27*   < > 3.46* 3.38* 3.91* 2.66*  GFRNONAA 33* 36*   < > 22* 22* 19* 30*  GFRAA 38* 42*   < > 25* 26* 22* 34*   < > = values in this interval not displayed.    LIVER FUNCTION TESTS: Recent Labs    08/27/18 1535 09/23/18 1140 09/24/18 0438 10/01/18 1430  BILITOT 0.8 0.7 1.0 0.8  AST 34 20 28 45*  ALT 45* 26 33 50*  ALKPHOS 184* 138* 166* 153*  PROT 5.0* 6.6 6.3* 5.5*  ALBUMIN 3.0* 2.9* 2.7* 3.4*     Assessment and Plan:  Successful completion of antibiotics with no clinical evidence of significant residual infection and minimal scant drainage from the percutaneous drainage catheter.  The catheter was removed today in its entirety and a dressing applied to the exit site.  The patient was given instructions for wound  care.   Electronically Signed: Azzie Roup 10/15/2018, 5:52 PM   I spent a total of 10 Minutes in face to face in clinical consultation, greater than 50% of which was counseling/coordinating care for right iliopsoas abscess drainage.

## 2018-10-17 IMAGING — CR DG CHEST 2V
2 series · 2 of 2 positions shown · non-contrast
Comparison: 04/24/2017 and prior radiographs

CLINICAL DATA: Acute chest pain today. Patient with sickle cell
disease.

EXAM:
CHEST - 2 VIEW

[w chest lat]
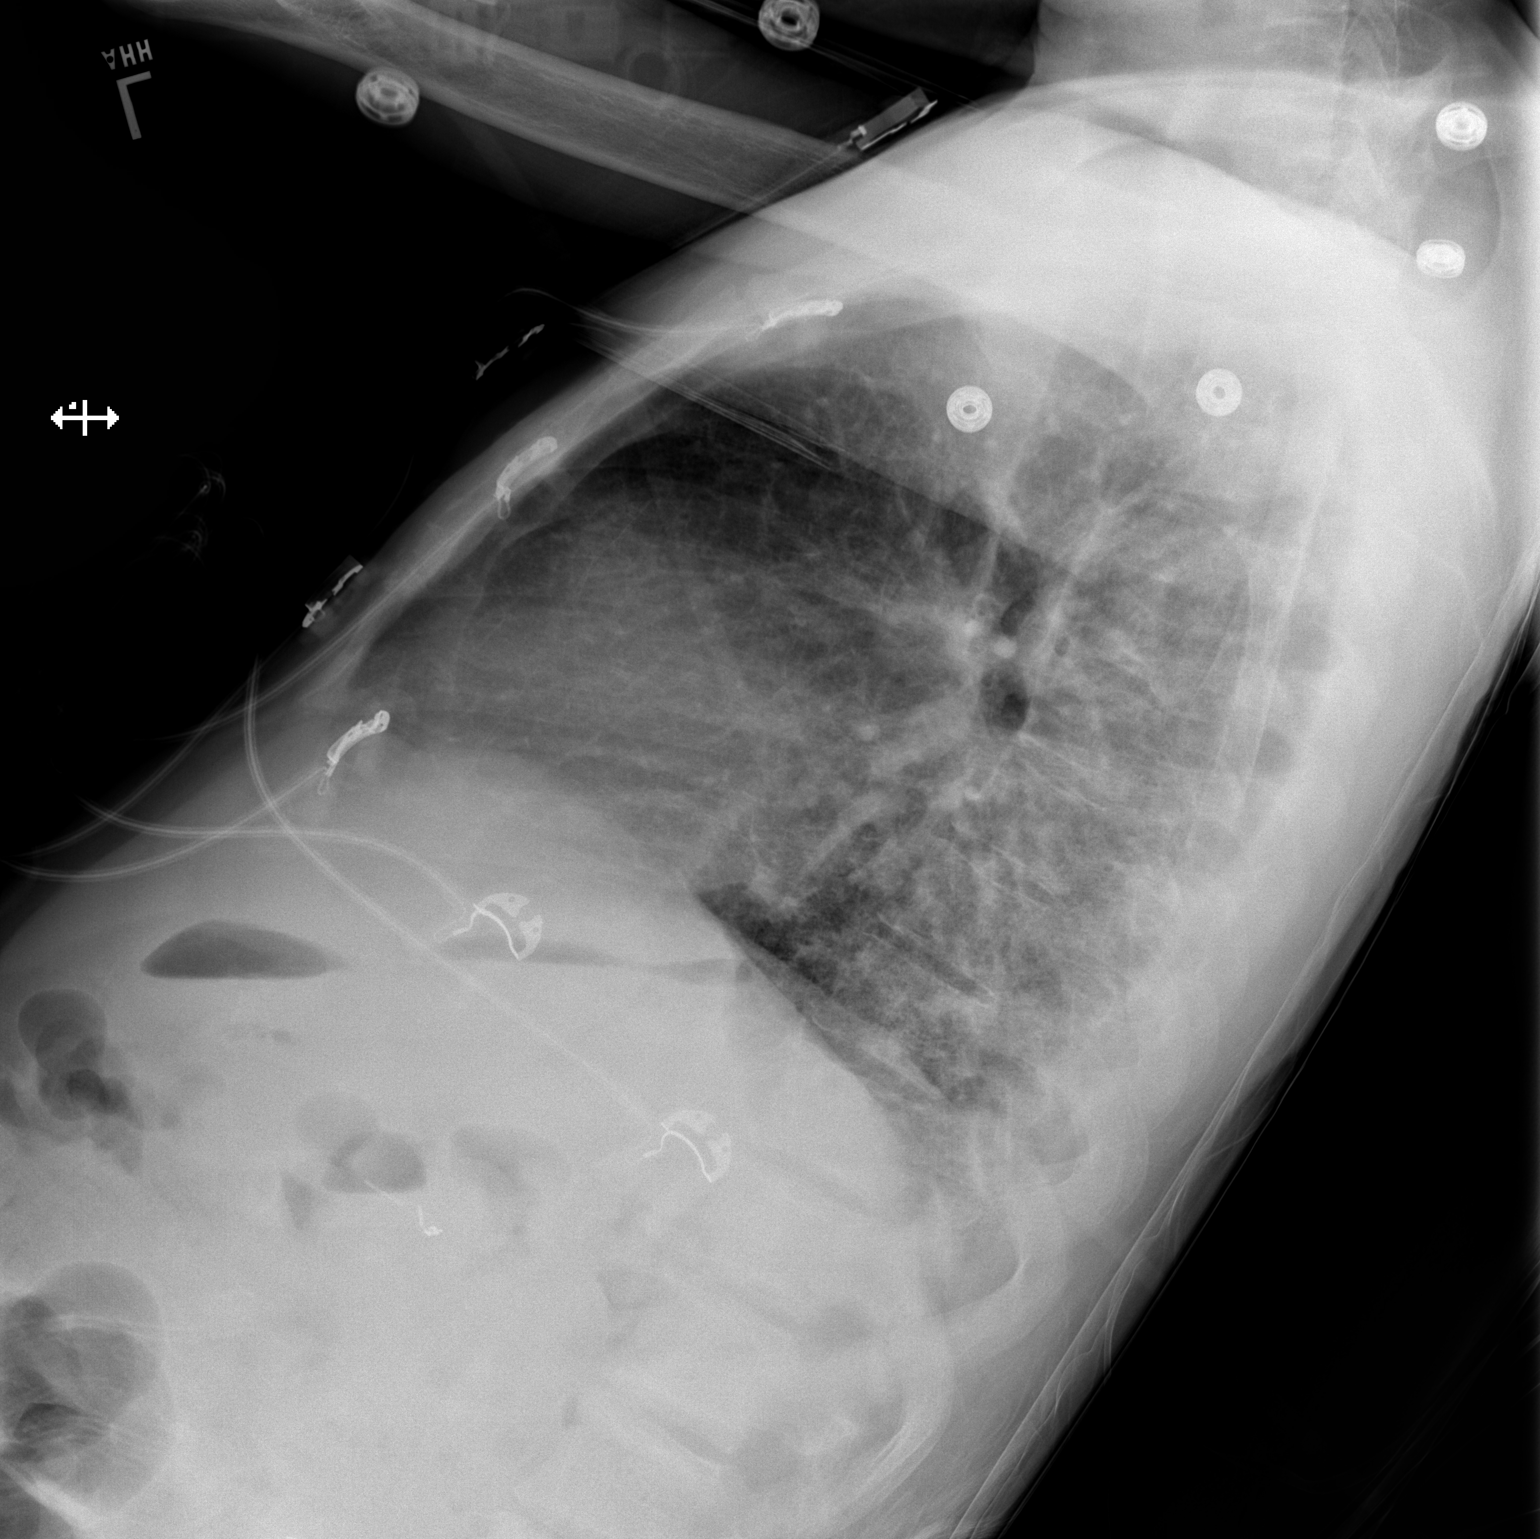

[x chest ap]
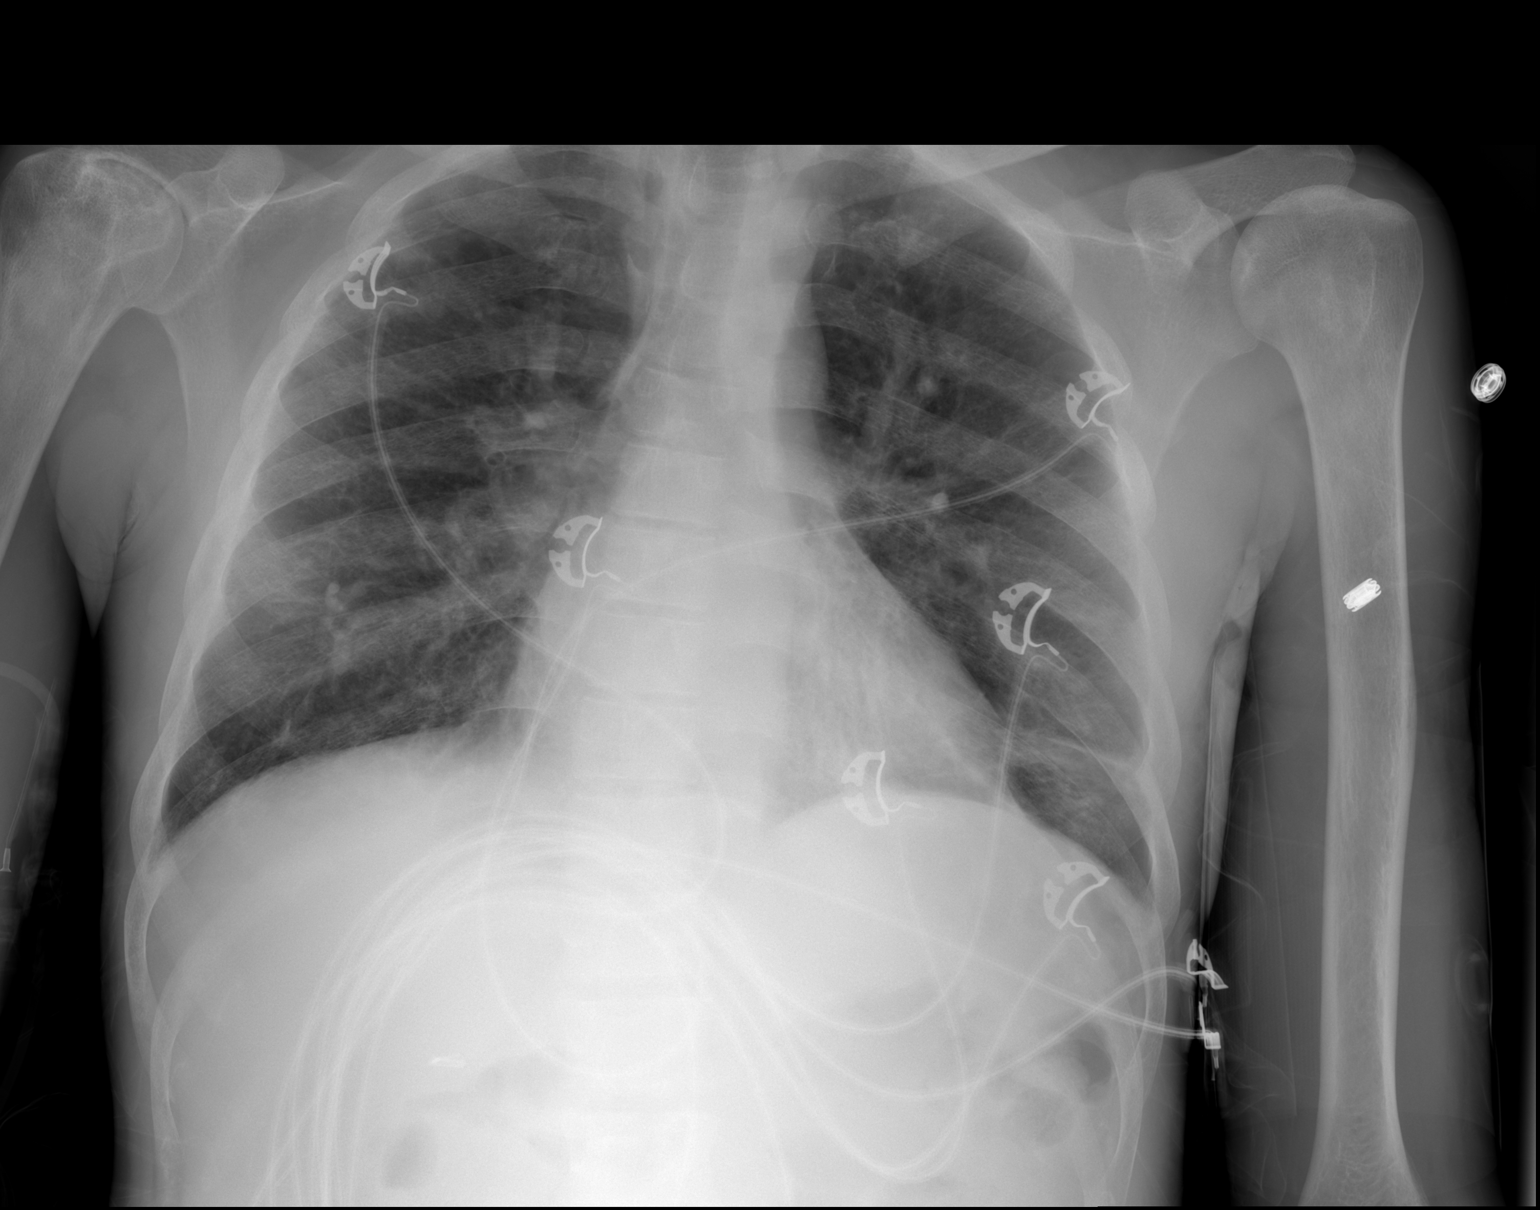

[2 of 2 positions shown; findings below may reference images not displayed]

FINDINGS: UPPER limits normal heart size and increased pulmonary vascularity
noted.

Mild chronic streaky pulmonary opacities within the LOWER lungs
noted.

No acute airspace disease, pleural effusion, consolidation or mass
noted.

No acute bony abnormalities are identified.
IMPRESSION: UPPER limits normal heart size without evidence of acute
cardiopulmonary disease.

## 2018-10-20 IMAGING — DX DG CHEST 1V PORT
1 series · 1 of 1 positions shown · non-contrast
Comparison: Chest x-ray of 05/12/2017

CLINICAL DATA: Central line placement

EXAM:
PORTABLE CHEST 1 VIEW

[chest ap]
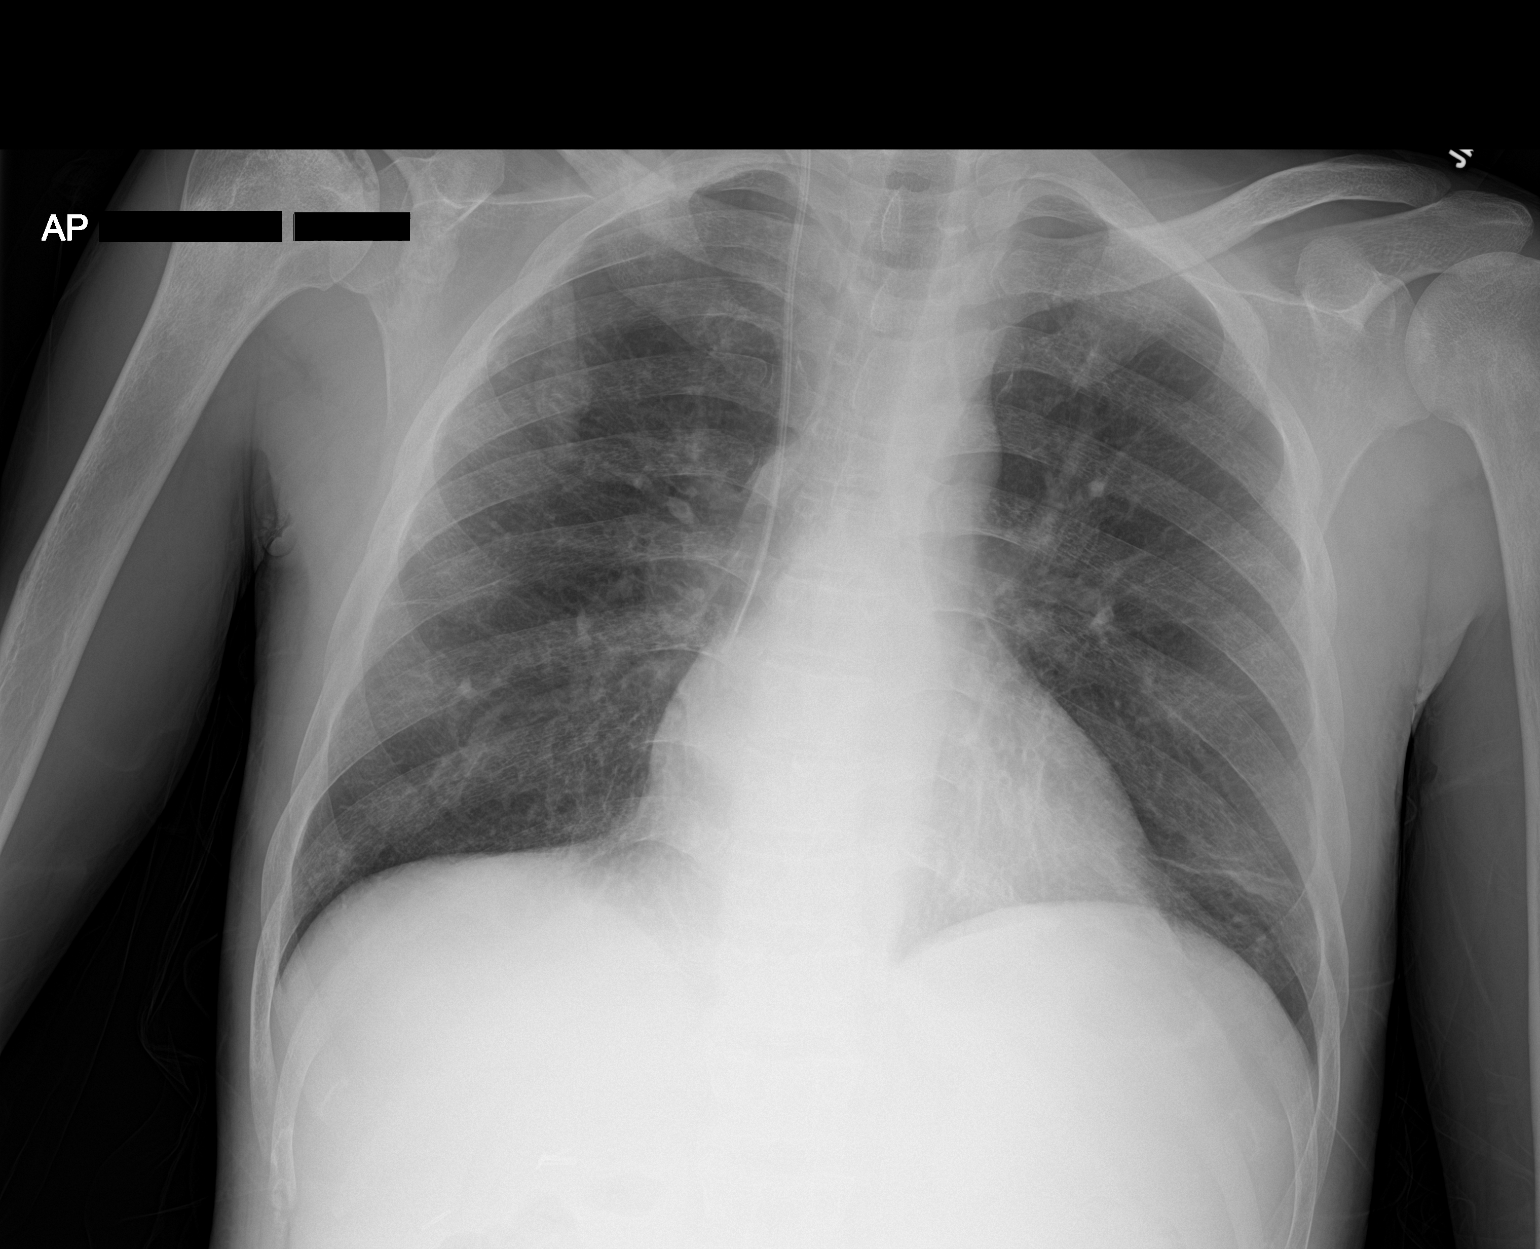

[1 of 1 positions shown; findings below may reference images not displayed]

FINDINGS: Right central venous line is present with tip overlying the lower
SVC near the expected right atrial junction. Linear atelectasis or
scarring remains at the left lung base. No pneumonia or effusion is
seen. Mediastinal and hilar contours are stable and the heart size
is stable. There are degenerative changes noted in the right
shoulder which may indicate avascular necrosis of the right humeral
head.
IMPRESSION: 1. Right central line placement tip overlies the lower SVC just
above the expected right atrial junction. No pneumothorax.
2. Linear atelectasis or scarring at the left lung base.
3. Abnormality the right humeral head.  Question AVN.

## 2018-10-31 DIAGNOSIS — N183 Chronic kidney disease, stage 3 (moderate): Secondary | ICD-10-CM | POA: Diagnosis not present

## 2018-11-02 ENCOUNTER — Emergency Department (HOSPITAL_COMMUNITY): Payer: Medicare Other

## 2018-11-02 ENCOUNTER — Other Ambulatory Visit: Payer: Self-pay

## 2018-11-02 ENCOUNTER — Encounter (HOSPITAL_COMMUNITY): Payer: Self-pay

## 2018-11-02 ENCOUNTER — Observation Stay (HOSPITAL_COMMUNITY)
Admission: EM | Admit: 2018-11-02 | Discharge: 2018-11-03 | Disposition: A | Discharge status: Home / Self Care | Payer: Medicare Other | Attending: Internal Medicine | Admitting: Internal Medicine

## 2018-11-02 DIAGNOSIS — R945 Abnormal results of liver function studies: Secondary | ICD-10-CM | POA: Diagnosis not present

## 2018-11-02 DIAGNOSIS — K219 Gastro-esophageal reflux disease without esophagitis: Secondary | ICD-10-CM | POA: Diagnosis not present

## 2018-11-02 DIAGNOSIS — D638 Anemia in other chronic diseases classified elsewhere: Secondary | ICD-10-CM | POA: Diagnosis present

## 2018-11-02 DIAGNOSIS — E440 Moderate protein-calorie malnutrition: Secondary | ICD-10-CM | POA: Insufficient documentation

## 2018-11-02 DIAGNOSIS — D631 Anemia in chronic kidney disease: Secondary | ICD-10-CM | POA: Diagnosis not present

## 2018-11-02 DIAGNOSIS — F17200 Nicotine dependence, unspecified, uncomplicated: Secondary | ICD-10-CM | POA: Diagnosis present

## 2018-11-02 DIAGNOSIS — D649 Anemia, unspecified: Secondary | ICD-10-CM

## 2018-11-02 DIAGNOSIS — R7989 Other specified abnormal findings of blood chemistry: Secondary | ICD-10-CM | POA: Insufficient documentation

## 2018-11-02 DIAGNOSIS — Z03818 Encounter for observation for suspected exposure to other biological agents ruled out: Secondary | ICD-10-CM | POA: Diagnosis not present

## 2018-11-02 DIAGNOSIS — R748 Abnormal levels of other serum enzymes: Secondary | ICD-10-CM | POA: Insufficient documentation

## 2018-11-02 DIAGNOSIS — M19072 Primary osteoarthritis, left ankle and foot: Secondary | ICD-10-CM | POA: Diagnosis not present

## 2018-11-02 DIAGNOSIS — Z79899 Other long term (current) drug therapy: Secondary | ICD-10-CM | POA: Diagnosis not present

## 2018-11-02 DIAGNOSIS — D7589 Other specified diseases of blood and blood-forming organs: Secondary | ICD-10-CM | POA: Diagnosis not present

## 2018-11-02 DIAGNOSIS — Z20828 Contact with and (suspected) exposure to other viral communicable diseases: Secondary | ICD-10-CM | POA: Insufficient documentation

## 2018-11-02 DIAGNOSIS — D5700 Hb-SS disease with crisis, unspecified: Secondary | ICD-10-CM | POA: Diagnosis not present

## 2018-11-02 DIAGNOSIS — N184 Chronic kidney disease, stage 4 (severe): Secondary | ICD-10-CM | POA: Diagnosis not present

## 2018-11-02 DIAGNOSIS — F17210 Nicotine dependence, cigarettes, uncomplicated: Secondary | ICD-10-CM | POA: Insufficient documentation

## 2018-11-02 LAB — CBC WITH DIFFERENTIAL/PLATELET
Abs Immature Granulocytes: 0.02 10*3/uL (ref 0.00–0.07)
Basophils Absolute: 0 10*3/uL (ref 0.0–0.1)
Basophils Relative: 0 %
Eosinophils Absolute: 0 10*3/uL (ref 0.0–0.5)
Eosinophils Relative: 0 %
HCT: 25.1 % — ABNORMAL LOW (ref 39.0–52.0)
Hemoglobin: 8.3 g/dL — ABNORMAL LOW (ref 13.0–17.0)
Immature Granulocytes: 0 %
Lymphocytes Relative: 46 %
Lymphs Abs: 3.6 10*3/uL (ref 0.7–4.0)
MCH: 39.3 pg — ABNORMAL HIGH (ref 26.0–34.0)
MCHC: 33.1 g/dL (ref 30.0–36.0)
MCV: 119 fL — ABNORMAL HIGH (ref 80.0–100.0)
Monocytes Absolute: 0.4 10*3/uL (ref 0.1–1.0)
Monocytes Relative: 5 %
Neutro Abs: 3.8 10*3/uL (ref 1.7–7.7)
Neutrophils Relative %: 49 %
Platelets: 597 10*3/uL — ABNORMAL HIGH (ref 150–400)
RBC: 2.11 MIL/uL — ABNORMAL LOW (ref 4.22–5.81)
WBC: 7.8 10*3/uL (ref 4.0–10.5)
nRBC: 1.1 % — ABNORMAL HIGH (ref 0.0–0.2)

## 2018-11-02 LAB — COMPREHENSIVE METABOLIC PANEL
ALT: 40 U/L (ref 0–44)
AST: 41 U/L (ref 15–41)
Albumin: 3.3 g/dL — ABNORMAL LOW (ref 3.5–5.0)
Alkaline Phosphatase: 222 U/L — ABNORMAL HIGH (ref 38–126)
Anion gap: 10 (ref 5–15)
BUN: 25 mg/dL — ABNORMAL HIGH (ref 6–20)
CO2: 18 mmol/L — ABNORMAL LOW (ref 22–32)
Calcium: 8.9 mg/dL (ref 8.9–10.3)
Chloride: 111 mmol/L (ref 98–111)
Creatinine, Ser: 2.36 mg/dL — ABNORMAL HIGH (ref 0.61–1.24)
GFR calc Af Amer: 40 mL/min — ABNORMAL LOW (ref >60)
GFR calc non Af Amer: 34 mL/min — ABNORMAL LOW (ref >60)
Glucose, Bld: 89 mg/dL (ref 70–99)
Potassium: 4.8 mmol/L (ref 3.5–5.1)
Sodium: 139 mmol/L (ref 135–145)
Total Bilirubin: 1.2 mg/dL (ref 0.3–1.2)
Total Protein: 6.3 g/dL — ABNORMAL LOW (ref 6.5–8.1)

## 2018-11-02 LAB — RETICULOCYTES
Immature Retic Fract: 34.6 % — ABNORMAL HIGH (ref 2.3–15.9)
RBC.: 2.11 MIL/uL — ABNORMAL LOW (ref 4.22–5.81)
Retic Count, Absolute: 144.3 10*3/uL (ref 19.0–186.0)
Retic Ct Pct: 6.8 % — ABNORMAL HIGH (ref 0.4–3.1)

## 2018-11-02 MED ORDER — HYDROMORPHONE HCL 2 MG/ML IJ SOLN: 2.0000 mg | INTRAMUSCULAR | Status: AC

## 2018-11-02 MED ORDER — LABETALOL HCL 5 MG/ML IV SOLN
5.0000 mg | Freq: Four times a day (QID) | INTRAVENOUS | Status: DC | PRN
  Filled 2018-11-02: qty 4

## 2018-11-02 MED ORDER — PRO-STAT SUGAR FREE PO LIQD
30.0000 mL | Freq: Two times a day (BID) | ORAL | Status: DC
  Filled 2018-11-02: qty 30

## 2018-11-02 MED ORDER — ONDANSETRON HCL 4 MG/2ML IJ SOLN: 4.0000 mg | Freq: Four times a day (QID) | INTRAMUSCULAR | Status: DC | PRN

## 2018-11-02 MED ORDER — SODIUM CHLORIDE 0.9% FLUSH: 9.0000 mL | INTRAVENOUS | Status: DC | PRN

## 2018-11-02 MED ORDER — HYDROMORPHONE HCL 2 MG/ML IJ SOLN
2.0000 mg | INTRAMUSCULAR | Status: AC
  Administered 2018-11-02: 19:00:00 2 mg via INTRAVENOUS
  Filled 2018-11-02: qty 1

## 2018-11-02 MED ORDER — DIPHENHYDRAMINE HCL 50 MG/ML IJ SOLN
25.0000 mg | Freq: Once | INTRAMUSCULAR | Status: AC
  Administered 2018-11-02: 25 mg via INTRAVENOUS
  Filled 2018-11-02: qty 1

## 2018-11-02 MED ORDER — SODIUM CHLORIDE 0.9% FLUSH
3.0000 mL | Freq: Once | INTRAVENOUS | Status: AC
  Administered 2018-11-02: 18:00:00 3 mL via INTRAVENOUS

## 2018-11-02 MED ORDER — NALOXONE HCL 0.4 MG/ML IJ SOLN: 0.4000 mg | INTRAMUSCULAR | Status: DC | PRN

## 2018-11-02 MED ORDER — DIPHENHYDRAMINE HCL 25 MG PO CAPS: 25.0000 mg | ORAL_CAPSULE | ORAL | Status: DC | PRN

## 2018-11-02 MED ORDER — SODIUM CHLORIDE 0.9 % IV SOLN
25.0000 mg | INTRAVENOUS | Status: DC | PRN
  Filled 2018-11-02: qty 0.5

## 2018-11-02 MED ORDER — POLYETHYLENE GLYCOL 3350 17 G PO PACK: 17.0000 g | PACK | Freq: Every day | ORAL | Status: DC | PRN

## 2018-11-02 MED ORDER — HYDROMORPHONE 1 MG/ML IV SOLN
INTRAVENOUS | Status: DC
  Administered 2018-11-03: 30 mg via INTRAVENOUS
  Administered 2018-11-03 (×2): 2.5 mg via INTRAVENOUS
  Administered 2018-11-03: 2 mg via INTRAVENOUS
  Filled 2018-11-02: qty 30

## 2018-11-02 MED ORDER — HYDROMORPHONE HCL 2 MG/ML IJ SOLN
2.0000 mg | INTRAMUSCULAR | Status: AC
  Administered 2018-11-02: 18:00:00 2 mg via INTRAVENOUS
  Filled 2018-11-02: qty 1

## 2018-11-02 MED ORDER — ENOXAPARIN SODIUM 30 MG/0.3ML ~~LOC~~ SOLN
30.0000 mg | Freq: Every day | SUBCUTANEOUS | Status: DC
  Filled 2018-11-02: qty 0.3

## 2018-11-02 MED ORDER — SENNOSIDES-DOCUSATE SODIUM 8.6-50 MG PO TABS
1.0000 | ORAL_TABLET | Freq: Two times a day (BID) | ORAL | Status: DC
  Filled 2018-11-02: qty 1

## 2018-11-02 MED ORDER — NICOTINE 14 MG/24HR TD PT24: 14.0000 mg | MEDICATED_PATCH | Freq: Every day | TRANSDERMAL | Status: DC | PRN

## 2018-11-02 NOTE — Progress Notes (Signed)
New Admission Note:   Patient arrived to 5E, pain 8/10. A xO x 4, drowsy. Telemetry on. Vitals stable. Skin dry itchy, ruses benadryl and nicotine patch. Patient oriented to room and staff.Orders have been reviewed and implemented. Will continue to monitor the patient. Call light has been placed within reach and bed alarm has been activated.    Today's Vitals   11/02/18 2130 11/02/18 2200 11/02/18 2221 11/02/18 2226  BP: 117/87 (!) 113/93  111/89  Pulse: (!) 112 88  70  Resp:  10  14  Temp:    98.2 F (36.8 C)  TempSrc:      SpO2: 96% 95%  95%  Weight:      Height:      PainSc:   8     Body mass index is 23.02 kg/m.

## 2018-11-02 NOTE — ED Notes (Signed)
Pt requests blood draw with IV/start

## 2018-11-02 NOTE — ED Notes (Signed)
ED TO INPATIENT HANDOFF REPORT  ED Nurse Name and Phone #: Claiborne Billings [phone]  S Name/Age/Gender [name] [name]. 38 y.o. male Room/Bed: WA24/WA24  Code Status   Code Status: Full Code  Home/SNF/Other Home Patient oriented to: self, place, time and situation Is this baseline? Yes   Triage Complete: Triage complete  Chief Complaint sickle cell crisis/ left foot pain/both legs pain  Triage Note Pt c/o SCC in bilateral legs. Pt notes swelling to his left foot. Pt states the last time this happened, he was having bad kidney problems.   Allergies Allergies  Allergen Reactions  . Nsaids Other (See Comments)    Kidney disease  . Morphine And Related Other (See Comments)    "Real bad headaches"     Level of Care/Admitting Diagnosis ED Disposition    ED Disposition Condition [place]: [place] [id number]  Level of Care: Telemetry [5]  Admit to tele based on following criteria: Monitor for Ischemic changes  Covid Evaluation: Asymptomatic Screening Protocol (No Symptoms)  Diagnosis: [facility] cell crisis [id number]  Admitting Physician: Jani Gravel [[id number]]  Attending Physician: Jani Gravel [[id number]]  PT Class (Do Not Modify): Observation [104]  PT Acc Code (Do Not Modify): Observation [10022]       B Medical/Surgery History Past Medical History:  Diagnosis Date  . Arachnoid Cyst of brain bilaterally    "2 really small ones in the back of my head; inside; saw them w/MRI" (09/25/2012)  . Bacterial pneumonia ~ 2012   "caught it here in the hospital" (09/25/2012)  . Chronic kidney disease    "from my sickle cell" (09/25/2012)  . CKD (chronic kidney disease), stage II   . GERD (gastroesophageal reflux disease)    "after I eat alot of spicey foods" (09/25/2012)  . Gynecomastia, male 07/10/2012  . History of blood transfusion    "always related to sickle cell crisis" (09/25/2012)  . 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   Noted in  noted from H[facility] at [facility]  . Migraines    "take RX qd to prevent them" (09/25/2012)  . Opioid dependence with withdrawal ([place]) 08/30/2012  . Sickle cell anemia (HCC)   . Sickle cell crisis ([place]) 09/25/2012  . Sickle cell nephropathy ([place]) 07/10/2012  . Sinus tachycardia   . Tachycardia with heart rate 121-140 beats per minute with ambulation 08/04/2016   Past Surgical History:  Procedure Laterality Date  . ABCESS DRAINAGE    . CHOLECYSTECTOMY  ~ 2012  . COLONOSCOPY N/A 04/23/2017   Procedure: COLONOSCOPY;  Surgeon: Irene Shipper, MD;  Location: [facility];  Service: Endoscopy;  Laterality: N/A;  . IR FLUORO GUIDE CV LINE RIGHT  12/17/2016  . IR RADIOLOGIST EVAL & MGMT  10/02/2018  . IR RADIOLOGIST EVAL & MGMT  10/15/2018  . IR REMOVAL TUN CV CATH W/O FL  12/21/2016  . IR US GUIDE VASC ACCESS RIGHT  12/17/2016  . spleenectomy       A IV Location/Drains/Wounds Patient Lines/Drains/Airways Status   Active Line/Drains/Airways    Name:   Placement date:   Placement time:   Site:   Days:   Peripheral IV 11/02/18 Left Antecubital   11/02/18    1754    Antecubital   less than 1   Closed System Drain 1 Right;Anterior Abdomen Bulb (JP) 10 Fr.   09/24/18    1033    Abdomen   39  Intake/Output Last 24 hours No intake or output data in the 24 hours ending 11/02/18 2134  Labs/Imaging Results for orders placed or performed during the hospital encounter of 11/02/18 (from the past 48 hour(s))  Comprehensive metabolic panel     Status: Abnormal   Collection Time: 11/02/18  5:53 PM  Result Value Ref Range   Sodium 139 135 - 145 mmol/L   Potassium 4.8 3.5 - 5.1 mmol/L   Chloride 111 98 - 111 mmol/L   CO2 18 (L) 22 - 32 mmol/L   Glucose, Bld 89 70 - 99 mg/dL   BUN 25 (H) 6 - 20 mg/dL   Creatinine, Ser 2.36 (H) 0.61 - 1.24 mg/dL   Calcium 8.9 8.9 - 10.3 mg/dL   Total Protein 6.3 (L) 6.5 - 8.1 g/dL   Albumin 3.3 (L) 3.5 - 5.0 g/dL   AST 41 15 - 41 U/L   ALT 40 0  - 44 U/L   Alkaline Phosphatase 222 (H) 38 - 126 U/L   Total Bilirubin 1.2 0.3 - 1.2 mg/dL   GFR calc non Af Amer 34 (L) >60 mL/min   GFR calc Af Amer 40 (L) >60 mL/min   Anion gap 10 5 - 15    Comment: Performed at [facility], [place] [address], [place], [place] [address]  CBC with Differential     Status: Abnormal   Collection Time: 11/02/18  5:53 PM  Result Value Ref Range   WBC 7.8 4.0 - 10.5 K/uL    Comment: WHITE COUNT CONFIRMED ON SMEAR   RBC 2.11 (L) 4.22 - 5.81 MIL/uL   Hemoglobin 8.3 (L) 13.0 - 17.0 g/dL   HCT 25.1 (L) 39.0 - 52.0 %   MCV 119.0 (H) 80.0 - 100.0 fL   MCH 39.3 (H) 26.0 - 34.0 pg   MCHC 33.1 30.0 - 36.0 g/dL   RDW Not Measured 11.5 - 15.5 %   Platelets 597 (H) 150 - 400 K/uL   nRBC 1.1 (H) 0.0 - 0.2 %   Neutrophils Relative % 49 %   Neutro Abs 3.8 1.7 - 7.7 K/uL   Lymphocytes Relative 46 %   Lymphs Abs 3.6 0.7 - 4.0 K/uL   Monocytes Relative 5 %   Monocytes Absolute 0.4 0.1 - 1.0 K/uL   Eosinophils Relative 0 %   Eosinophils Absolute 0.0 0.0 - 0.5 K/uL   Basophils Relative 0 %   Basophils Absolute 0.0 0.0 - 0.1 K/uL   WBC Morphology VACUOLATED NEUTROPHILS    RBC Morphology NRBC PRESENT    Immature Granulocytes 0 %   Abs Immature Granulocytes 0.02 0.00 - 0.07 K/uL   Tammy Sours Bodies PRESENT    Polychromasia PRESENT    Sickle Cells PRESENT    Target Cells PRESENT     Comment: Performed at [facility], [place] [address] [place], [place] [address]  Reticulocytes     Status: Abnormal   Collection Time: 11/02/18  5:53 PM  Result Value Ref Range   Retic Ct Pct 6.8 (H) 0.4 - 3.1 %   RBC. 2.11 (L) 4.22 - 5.81 MIL/uL   Retic Count, Absolute 144.3 19.0 - 186.0 K/uL   Immature Retic Fract 34.6 (H) 2.3 - 15.9 %    Comment: Performed at [facility], [place] [address] [place], [place] [address]   Dg Ankle Complete Left  Result Date: 11/02/2018 CLINICAL DATA:  Pain, sickle crisis EXAM: LEFT FOOT -  COMPLETE 3+ VIEW; LEFT ANKLE  COMPLETE - 3+ VIEW COMPARISON:  08/20/2018 FINDINGS: No fracture or dislocation of the left foot. There is redemonstrated, sclerosis and severe distal arthritic change of the left first metatarsal at the metatarsophalangeal joint. The remaining osseous structures, including the left first proximal phalanx, are intact. The joint spaces are preserved with the exception of the first metatarsophalangeal joint. Soft tissues are unremarkable. IMPRESSION: No fracture or dislocation of the left foot. There is redemonstrated, sclerosis and severe distal arthritic change of the left first metatarsal at the metatarsophalangeal joint. The remaining osseous structures, including the left first proximal phalanx, are intact. 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 Electronically Signed   By: Eddie Candle M.D.   On: 11/02/2018 17:28   Dg Foot Complete Left  Result Date: 11/02/2018 CLINICAL DATA:  Pain, sickle crisis EXAM: LEFT FOOT - COMPLETE 3+ VIEW; LEFT ANKLE COMPLETE - 3+ VIEW COMPARISON:  08/20/2018 FINDINGS: No fracture or dislocation of the left foot. There is redemonstrated, sclerosis and severe distal arthritic change of the left first metatarsal at the metatarsophalangeal joint. The remaining osseous structures, including the left first proximal phalanx, are intact. The joint spaces are preserved with the exception of the first metatarsophalangeal joint. Soft tissues are unremarkable. IMPRESSION: No fracture or dislocation of the left foot. There is redemonstrated, sclerosis and severe distal arthritic change of the left first metatarsal at the metatarsophalangeal joint. The remaining osseous structures, including the left first proximal phalanx, are intact. Findings are unchanged compared to examination dated 08/20/2018 and remain consistent with posttraumatic arthrosis or advanced  avascular necrosis. Electronically Signed   By: Eddie Candle M.D.   On: 11/02/2018 17:28    Pending Labs Unresulted Labs (From admission, onward)    Start     Ordered   11/09/18 0500  Creatinine, serum  (enoxaparin (LOVENOX)    CrCl < 30 ml/min)  Weekly,   R    Comments: while on enoxaparin therapy.    11/02/18 2112   11/03/18 0500  Comprehensive metabolic panel  Tomorrow morning,   R     11/02/18 2112   11/03/18 0500  CBC  Daily,   R     11/02/18 2112   11/02/18 2125  SARS CORONAVIRUS 2 (TAT 6-24 HRS) Nasopharyngeal Nasopharyngeal Swab  (Asymptomatic/Tier 2 Patients Labs)  Once,   STAT    Question Answer Comment  Is this test for diagnosis or screening Screening   Symptomatic for COVID-19 as defined by CDC No   Hospitalized for COVID-19 No   Admitted to ICU for COVID-19 No   Previously tested for COVID-19 No   Resident in a congregate (group) care setting No   Employed in healthcare setting No      11/02/18 2124          Vitals/Pain Today's Vitals   11/02/18 2000 11/02/18 2058 11/02/18 2100 11/02/18 2130  BP: 106/85 105/74 113/88 117/87  Pulse: 100 (!) 106 89 (!) 112  Resp:  18    Temp:      TempSrc:      SpO2: 96% 99% 97% 96%  Weight:      Height:      PainSc:        Isolation Precautions No active isolations  Medications Medications  HYDROmorphone (DILAUDID) injection 2 mg (0 mg Intravenous Hold 11/02/18 2042)    Or  HYDROmorphone (DILAUDID) injection 2 mg ( Subcutaneous See Alternative 11/02/18 2042)  senna-docusate (Senokot-S) tablet  1 tablet (has no administration in time range)  polyethylene glycol (MIRALAX / GLYCOLAX) packet 17 g (has no administration in time range)  enoxaparin (LOVENOX) injection 30 mg (has no administration in time range)  naloxone (NARCAN) injection 0.4 mg (has no administration in time range)    And  sodium chloride flush (NS) 0.9 % injection 9 mL (has no administration in time range)  ondansetron (ZOFRAN) injection 4 mg (has no  administration in time range)  diphenhydrAMINE (BENADRYL) capsule 25 mg (has no administration in time range)    Or  diphenhydrAMINE (BENADRYL) 25 mg in sodium chloride 0.9 % 50 mL IVPB (has no administration in time range)  HYDROmorphone (DILAUDID) 1 mg/mL PCA injection (has no administration in time range)  sodium chloride flush (NS) 0.9 % injection 3 mL (3 mLs Intravenous Given 11/02/18 1756)  HYDROmorphone (DILAUDID) injection 2 mg (2 mg Intravenous Given 11/02/18 1757)    Or  HYDROmorphone (DILAUDID) injection 2 mg ( Subcutaneous See Alternative 11/02/18 1757)  HYDROmorphone (DILAUDID) injection 2 mg (2 mg Intravenous Given 11/02/18 1831)    Or  HYDROmorphone (DILAUDID) injection 2 mg ( Subcutaneous See Alternative 11/02/18 1831)  HYDROmorphone (DILAUDID) injection 2 mg (2 mg Intravenous Given 11/02/18 1915)    Or  HYDROmorphone (DILAUDID) injection 2 mg ( Subcutaneous See Alternative 11/02/18 1915)  diphenhydrAMINE (BENADRYL) injection 25 mg (25 mg Intravenous Given 11/02/18 1756)    Mobility walks Low fall risk   Focused Assessments pt has bilateral feet pain, to be on dilaudid PCA   R Recommendations: See Admitting Provider Note  Report given to:   Additional Notes:  None

## 2018-11-02 NOTE — H&P (Signed)
TRH H&P    Patient Demographics:    [name] [name], is a 38 y.o. male  MRN: [id number]  DOB - [birth year]  Admit Date - 11/02/2018  Referring MD/NP/PA:  Francine Graven  Outpatient Primary MD for the patient is [name], MD  Patient coming from:  home  Chief complaint-  Sickle cell pain   HPI:    [name] [name]  is a 38 y.o. male,  w Sickle cell anemia, nephropathy, Immune-complex glomerulonephritis, CKD stage3, Jerrye Bushy, Migraines, h/o colitis 04/19/2017 ,  h/o R iliacus abscess which required perc drain 09/24/2018,  apparently c/o pain in the right foot and bilateral right and left distal lower ext,  Pt states this pain is consistent with his sickle cell crisis pain, and probably related to change in weather.  Pt denies dehydration, fever, chills, cough, cp, palp, sob, n/v, diarrhea, brbpr, dysuria.   In ED,  T 98.4, P 97 R 16, Bp 188/77 pox 100% on RA Wt 60kg  Wbc 7.8, Hgb 8.3, Plt 597   Na 139, K 4.8, Bun 25, Creatinine 2.36 Ast 41, Alt 40 Alk phos 222, T. Bili 1.2 retic 6.8 (high)  Xray left foot/ ankle IMPRESSION: No fracture or dislocation of the left foot. There is redemonstrated, sclerosis and severe distal arthritic change of the left first metatarsal at the metatarsophalangeal joint. The remaining osseous structures, including the left first proximal phalanx, are intact. 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  IMPRESSION: No fracture or dislocation of the left foot. There is redemonstrated, sclerosis and severe distal arthritic change of the left first metatarsal at the metatarsophalangeal joint. The remaining osseous structures, including the left first proximal phalanx, are intact. Findings are unchanged compared to examination dated 08/20/2018 and remain consistent  with posttraumatic arthrosis or advanced avascular necrosis.  Pt received dilaudid 68m iv x6 without relief, along with benadryl 239miv x1 in the ED.   Ed requesting admission for sickle cell crisis.    Review of systems:    In addition to the HPI above,  No Fever-chills, No Headache, No changes with Vision or hearing, No problems swallowing food or Liquids, No Chest pain, Cough or Shortness of Breath, No Abdominal pain, No Nausea or Vomiting, bowel movements are regular, No Blood in stool or Urine, No dysuria, No new skin rashes or bruises,  No new weakness, tingling, numbness in any extremity, No recent weight gain or loss, No polyuria, polydypsia or polyphagia, No significant Mental Stressors.  All other systems reviewed and are negative.    Past History of the following :    Past Medical History:  Diagnosis Date   Arachnoid Cyst of brain bilaterally    "2 really small ones in the back of my head; inside; saw them w/MRI" (09/25/2012)   Bacterial pneumonia ~ 2012   "caught it here in the hospital" (09/25/2012)   Chronic kidney disease    "from my sickle cell" (09/25/2012)   CKD (chronic kidney disease), stage II  GERD (gastroesophageal reflux disease)    "after I eat alot of spicey foods" (09/25/2012)   Gynecomastia, male 07/10/2012   History of blood transfusion    "always related to sickle cell crisis" (09/25/2012)   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   Noted in noted from Hematology notes at [facility]    "take RX qd to prevent them" (09/25/2012)   Opioid dependence with withdrawal ([place]) 08/30/2012   Sickle cell anemia (HCC)    Sickle cell crisis ([place]) 09/25/2012   Sickle cell nephropathy ([place]) 07/10/2012   Sinus tachycardia    Tachycardia with heart rate 121-140 beats per minute with ambulation 08/04/2016      Past Surgical History:  Procedure Laterality Date   ABCESS DRAINAGE     CHOLECYSTECTOMY  ~ 2012    COLONOSCOPY N/A 04/23/2017   Procedure: COLONOSCOPY;  Surgeon: Irene Shipper, MD;  Location: [facility];  Service: Endoscopy;  Laterality: N/A;   IR FLUORO GUIDE CV LINE RIGHT  12/17/2016   IR RADIOLOGIST EVAL & MGMT  10/02/2018   IR RADIOLOGIST EVAL & MGMT  10/15/2018   IR REMOVAL TUN CV CATH W/O FL  12/21/2016   IR US GUIDE VASC ACCESS RIGHT  12/17/2016   spleenectomy        Social History:      Social History   Tobacco Use   Smoking status: Current Some Day Smoker    Packs/day: 0.10    Types: Cigarettes   Smokeless tobacco: Never Used   Tobacco comment: 09/25/2012 "I don't buy cigarettes; bum one from friends q now and then"  Substance Use Topics   Alcohol use: Yes    Alcohol/week: 0.0 standard drinks    Frequency: Never    Comment: Once a month       Family History :     Family History  Problem Relation Age of Onset   Breast cancer Mother        Home Medications:   Prior to Admission medications   Medication Sig Start Date End Date Taking? Authorizing Provider  cholecalciferol (VITAMIN D3) 25 MCG (1000 UT) tablet Take 2,000 Units by mouth daily.   Yes [provider]  [facility] VITAMIN B12 1000 MCG tablet Take 2,000 mcg by mouth daily. 06/21/18  Yes [provider]  folic acid (FOLVITE) 1 MG tablet Take 1 tablet (1 mg total) daily by mouth. 12/22/16  Yes Leana Gamer, MD  hydroxyurea (HYDREA) 500 MG capsule TAKE 3 CAPSULES BY MOUTH DAILY Patient taking differently: Take 1,500 mg by mouth at bedtime.  06/19/18  Yes Lanae Boast, FNP  nortriptyline (PAMELOR) 25 MG capsule Take 1 capsule (25 mg total) by mouth at bedtime as needed for sleep. 06/10/18  Yes Donnamae Jude, MD  oxyCODONE (OXYCONTIN) 15 mg 12 hr tablet Take 1 tablet (15 mg total) by mouth every 12 (twelve) hours. 10/05/18  Yes [name], FNP  Oxycodone HCl 10 MG TABS Take 1 tablet (10 mg total) by mouth every 6 (six) hours as needed. Patient taking differently: Take 10 mg  by mouth every 6 (six) hours as needed (pain).  10/05/18  Yes [name], FNP     Allergies:     Allergies  Allergen Reactions   Nsaids Other (See Comments)    Kidney disease   Morphine And Related Other (See Comments)    "Real bad headaches"      Physical Exam:   Vitals  Blood pressure 117/87, pulse Marland Kitchen)  112, temperature 98.4 F (36.9 C), temperature source Oral, resp. rate 18, height 5' 3.5" (1.613 m), weight 59.9 kg, SpO2 96 %.  1.  General: axoxo3  2. Psychiatric: euthymic  3. Neurologic: nonfocal  4. HEENMT:  Anicteric, pupils 89m symmetric, direct, consensual intact Neck: no jvd  5. Respiratory : CTAB  6. Cardiovascular : tachy s1, s2, no m/g/r  7. Gastrointestinal:  ABd: soft, nt, nd, +bs  8. Skin:  Ext: no c/c/e, no rash  9.Musculoskeletal:  Good ROM    Data Review:    CBC Recent Labs  Lab 11/02/18 1753  WBC 7.8  HGB 8.3*  HCT 25.1*  PLT 597*  MCV 119.0*  MCH 39.3*  MCHC 33.1  RDW Not Measured  LYMPHSABS 3.6  MONOABS 0.4  EOSABS 0.0  BASOSABS 0.0   ------------------------------------------------------------------------------------------------------------------  Results for orders placed or performed during the hospital encounter of 11/02/18 (from the past 48 hour(s))  Comprehensive metabolic panel     Status: Abnormal   Collection Time: 11/02/18  5:53 PM  Result Value Ref Range   Sodium 139 135 - 145 mmol/L   Potassium 4.8 3.5 - 5.1 mmol/L   Chloride 111 98 - 111 mmol/L   CO2 18 (L) 22 - 32 mmol/L   Glucose, Bld 89 70 - 99 mg/dL   BUN 25 (H) 6 - 20 mg/dL   Creatinine, Ser 2.36 (H) 0.61 - 1.24 mg/dL   Calcium 8.9 8.9 - 10.3 mg/dL   Total Protein 6.3 (L) 6.5 - 8.1 g/dL   Albumin 3.3 (L) 3.5 - 5.0 g/dL   AST 41 15 - 41 U/L   ALT 40 0 - 44 U/L   Alkaline Phosphatase 222 (H) 38 - 126 U/L   Total Bilirubin 1.2 0.3 - 1.2 mg/dL   GFR calc non Af Amer 34 (L) >60 mL/min   GFR calc Af Amer 40 (L) >60 mL/min   Anion gap 10  5 - 15    Comment: Performed at [facility] [id number] [place] [id number] CBC with Differential     Status: Abnormal   Collection Time: 11/02/18  5:53 PM  Result Value Ref Range   WBC 7.8 4.0 - 10.5 K/uL    Comment: WHITE COUNT CONFIRMED ON SMEAR   RBC 2.11 (L) 4.22 - 5.81 MIL/uL   Hemoglobin 8.3 (L) 13.0 - 17.0 g/dL   HCT 25.1 (L) 39.0 - 52.0 %   MCV 119.0 (H) 80.0 - 100.0 fL   MCH 39.3 (H) 26.0 - 34.0 pg   MCHC 33.1 30.0 - 36.0 g/dL   RDW Not Measured 11.5 - 15.5 %   Platelets 597 (H) 150 - 400 K/uL   nRBC 1.1 (H) 0.0 - 0.2 %   Neutrophils Relative % 49 %   Neutro Abs 3.8 1.7 - 7.7 K/uL   Lymphocytes Relative 46 %   Lymphs Abs 3.6 0.7 - 4.0 K/uL   Monocytes Relative 5 %   Monocytes Absolute 0.4 0.1 - 1.0 K/uL   Eosinophils Relative 0 %   Eosinophils Absolute 0.0 0.0 - 0.5 K/uL   Basophils Relative 0 %   Basophils Absolute 0.0 0.0 - 0.1 K/uL   WBC Morphology VACUOLATED NEUTROPHILS    RBC Morphology NRBC PRESENT    Immature Granulocytes 0 %   Abs Immature Granulocytes 0.02 0.00 - 0.07 K/uL   HTammy SoursBodies PRESENT    Polychromasia PRESENT    Sickle Cells PRESENT    Target  Cells PRESENT     Comment: Performed at [facility], [place] [address] [place], [place] [address]  Reticulocytes     Status: Abnormal   Collection Time: 11/02/18  5:53 PM  Result Value Ref Range   Retic Ct Pct 6.8 (H) 0.4 - 3.1 %   RBC. 2.11 (L) 4.22 - 5.81 MIL/uL   Retic Count, Absolute 144.3 19.0 - 186.0 K/uL   Immature Retic Fract 34.6 (H) 2.3 - 15.9 %    Comment: Performed at [facility], [place] [address] [place], [place] [address]    Chemistries  Recent Labs  Lab 11/02/18 1753  NA 139  K 4.8  CL 111  CO2 18*  GLUCOSE 89  BUN 25*  CREATININE 2.36*  CALCIUM 8.9  AST 41  ALT 40  ALKPHOS 222*  BILITOT 1.2    ------------------------------------------------------------------------------------------------------------------  ------------------------------------------------------------------------------------------------------------------ GFR: Estimated Creatinine Clearance: 35.9 mL/min (A) (by C-G formula based on SCr of 2.36 mg/dL (H)). Liver Function Tests: Recent Labs  Lab 11/02/18 1753  AST 41  ALT 40  ALKPHOS 222*  BILITOT 1.2  PROT 6.3*  ALBUMIN 3.3*   No results for input(s): LIPASE, AMYLASE in the last 168 hours. No results for input(s): AMMONIA in the last 168 hours. Coagulation Profile: No results for input(s): INR, PROTIME in the last 168 hours. Cardiac Enzymes: No results for input(s): CKTOTAL, CKMB, CKMBINDEX, TROPONINI in the last 168 hours. BNP (last 3 results) No results for input(s): PROBNP in the last 8760 hours. HbA1C: No results for input(s): HGBA1C in the last 72 hours. CBG: No results for input(s): GLUCAP in the last 168 hours. Lipid Profile: No results for input(s): CHOL, HDL, LDLCALC, TRIG, CHOLHDL, LDLDIRECT in the last 72 hours. Thyroid Function Tests: No results for input(s): TSH, T4TOTAL, FREET4, T3FREE, THYROIDAB in the last 72 hours. Anemia Panel: Recent Labs    11/02/18 1753  RETICCTPCT 6.8*    --------------------------------------------------------------------------------------------------------------- Urine analysis:    Component Value Date/Time   COLORURINE STRAW (A) 09/23/2018 1522   APPEARANCEUR HAZY (A) 09/23/2018 1522   LABSPEC 1.009 09/23/2018 1522   PHURINE 7.0 09/23/2018 1522   GLUCOSEU 50 (A) 09/23/2018 1522   HGBUR NEGATIVE 09/23/2018 1522   HGBUR small 03/10/2010 1445   BILIRUBINUR neg 10/01/2018 1412   KETONESUR NEGATIVE 09/23/2018 1522   PROTEINUR Positive (A) 10/01/2018 1412   PROTEINUR >=300 (A) 09/23/2018 1522   UROBILINOGEN 0.2 10/01/2018 1412   UROBILINOGEN 0.2 05/11/2017 1335   NITRITE neg 10/01/2018 1412    NITRITE NEGATIVE 09/23/2018 1522   LEUKOCYTESUR Negative 10/01/2018 1412   LEUKOCYTESUR SMALL (A) 09/23/2018 1522      Imaging Results:    Dg Ankle Complete Left  Result Date: 11/02/2018 CLINICAL DATA:  Pain, sickle crisis EXAM: LEFT FOOT - COMPLETE 3+ VIEW; LEFT ANKLE COMPLETE - 3+ VIEW COMPARISON:  08/20/2018 FINDINGS: No fracture or dislocation of the left foot. There is redemonstrated, sclerosis and severe distal arthritic change of the left first metatarsal at the metatarsophalangeal joint. The remaining osseous structures, including the left first proximal phalanx, are intact. The joint spaces are preserved with the exception of the first metatarsophalangeal joint. Soft tissues are unremarkable. IMPRESSION: No fracture or dislocation of the left foot. There is redemonstrated, sclerosis and severe distal arthritic change of the left first metatarsal at the metatarsophalangeal joint. The remaining osseous structures, including the left first proximal phalanx, are intact. Findings are unchanged compared to examination dated 08/20/2018 and remain consistent with posttraumatic arthrosis or  advanced avascular necrosis. Electronically Signed   By: Eddie Candle M.D.   On: 11/02/2018 17:28   Dg Foot Complete Left  Result Date: 11/02/2018 CLINICAL DATA:  Pain, sickle crisis EXAM: LEFT FOOT - COMPLETE 3+ VIEW; LEFT ANKLE COMPLETE - 3+ VIEW COMPARISON:  08/20/2018 FINDINGS: No fracture or dislocation of the left foot. There is redemonstrated, sclerosis and severe distal arthritic change of the left first metatarsal at the metatarsophalangeal joint. The remaining osseous structures, including the left first proximal phalanx, are intact. The joint spaces are preserved with the exception of the first metatarsophalangeal joint. Soft tissues are unremarkable. IMPRESSION: No fracture or dislocation of the left foot. There is redemonstrated, sclerosis and severe distal arthritic change of the left first metatarsal  at the metatarsophalangeal joint. The remaining osseous structures, including the left first proximal phalanx, are intact. 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 Electronically Signed   By: Eddie Candle M.D.   On: 11/02/2018 17:28      Assessment & Plan:    Principal Problem:   Sickle cell pain crisis ([place]) Active Problems:   TOBACCO ABUSE   CKD (chronic kidney disease), stage IV (HCC)   Anemia   Macrocytosis due to Hydroxyurea   Sickle cell crisis (HCC)   Abnormal liver function  Sickle cell pain crisis Hydrate with 1/2 ns at 19m per hour Dilaudid pca Benadryl 287miv q4h prn itching zofran 8m68mv prn n/v  Anemia, Thrombocytosis, Macrocytosis Check cbc qam x3  CKD stage4 Check cmp in am  Abnormal liver function (elevated alkaline phosphatase) Check GGT in am Check cmp in am  Moderate protein calorie malnutrition prostat 32m40m bid  Nicotine dep, polysubstance abuse Nicotine patch prn Pt counselled on smoking cessation x 3 minutes  DVT Prophylaxis-   Lovenox - SCDs   AM Labs Ordered, also please review Full Orders  Family Communication: Admission, patients condition and plan of care including tests being ordered have been discussed with the patient  who indicate understanding and agree with the plan and Code Status.  Code Status:  FULL CODE per patient, [facility] mother [facility]ation: Based on patients clinical presentation and evaluation of above clinical data, I have made determination that patient meets observation criteria at this time.  Time spent in minutes : 70 minutes   JameJani Gravel on 11/02/2018 at 9:59 PM

## 2018-11-02 NOTE — ED Notes (Signed)
Report attempted 

## 2018-11-02 NOTE — ED Notes (Signed)
admitting Provider at bedside. 
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
## 2018-11-02 NOTE — ED Triage Notes (Signed)
Pt c/o SCC in bilateral legs. Pt notes swelling to his left foot. Pt states the last time this happened, he was having bad kidney problems.

## 2018-11-03 DIAGNOSIS — N184 Chronic kidney disease, stage 4 (severe): Secondary | ICD-10-CM | POA: Diagnosis not present

## 2018-11-03 DIAGNOSIS — D649 Anemia, unspecified: Secondary | ICD-10-CM | POA: Diagnosis not present

## 2018-11-03 DIAGNOSIS — D7589 Other specified diseases of blood and blood-forming organs: Secondary | ICD-10-CM | POA: Diagnosis not present

## 2018-11-03 DIAGNOSIS — F17200 Nicotine dependence, unspecified, uncomplicated: Secondary | ICD-10-CM | POA: Diagnosis not present

## 2018-11-03 DIAGNOSIS — D5700 Hb-SS disease with crisis, unspecified: Secondary | ICD-10-CM | POA: Diagnosis not present

## 2018-11-03 DIAGNOSIS — R945 Abnormal results of liver function studies: Secondary | ICD-10-CM | POA: Diagnosis not present

## 2018-11-03 LAB — COMPREHENSIVE METABOLIC PANEL
ALT: 40 U/L (ref 0–44)
AST: 42 U/L — ABNORMAL HIGH (ref 15–41)
Albumin: 3.3 g/dL — ABNORMAL LOW (ref 3.5–5.0)
Alkaline Phosphatase: 223 U/L — ABNORMAL HIGH (ref 38–126)
Anion gap: 9 (ref 5–15)
BUN: 25 mg/dL — ABNORMAL HIGH (ref 6–20)
CO2: 19 mmol/L — ABNORMAL LOW (ref 22–32)
Calcium: 9 mg/dL (ref 8.9–10.3)
Chloride: 113 mmol/L — ABNORMAL HIGH (ref 98–111)
Creatinine, Ser: 2.24 mg/dL — ABNORMAL HIGH (ref 0.61–1.24)
GFR calc Af Amer: 42 mL/min — ABNORMAL LOW (ref >60)
GFR calc non Af Amer: 37 mL/min — ABNORMAL LOW (ref >60)
Glucose, Bld: 87 mg/dL (ref 70–99)
Potassium: 4.6 mmol/L (ref 3.5–5.1)
Sodium: 141 mmol/L (ref 135–145)
Total Bilirubin: 1.7 mg/dL — ABNORMAL HIGH (ref 0.3–1.2)
Total Protein: 6.3 g/dL — ABNORMAL LOW (ref 6.5–8.1)

## 2018-11-03 LAB — CBC
HCT: 24.1 % — ABNORMAL LOW (ref 39.0–52.0)
Hemoglobin: 7.9 g/dL — ABNORMAL LOW (ref 13.0–17.0)
MCH: 39.3 pg — ABNORMAL HIGH (ref 26.0–34.0)
MCHC: 32.8 g/dL (ref 30.0–36.0)
MCV: 119.9 fL — ABNORMAL HIGH (ref 80.0–100.0)
Platelets: 490 10*3/uL — ABNORMAL HIGH (ref 150–400)
RBC: 2.01 MIL/uL — ABNORMAL LOW (ref 4.22–5.81)
WBC: 8.7 10*3/uL (ref 4.0–10.5)
nRBC: 0.8 % — ABNORMAL HIGH (ref 0.0–0.2)

## 2018-11-03 LAB — GAMMA GT: GGT: 181 U/L — ABNORMAL HIGH (ref 7–50)

## 2018-11-03 LAB — SARS CORONAVIRUS 2 (TAT 6-24 HRS): SARS Coronavirus 2: NEGATIVE

## 2018-11-03 MED ORDER — HYDROMORPHONE HCL 1 MG/ML IJ SOLN: 2.0000 mg | INTRAMUSCULAR | Status: AC

## 2018-11-03 NOTE — Discharge Instructions (Signed)
Sickle Cell Anemia, Adult ° °Sickle cell anemia is a condition in which red blood cells have an abnormal “sickle” shape. Red blood cells carry oxygen through the body. Sickle-shaped red blood cells do not live as long as normal red blood cells. They also clump together and block blood from flowing through the blood vessels. This condition prevents the body from getting enough oxygen. Sickle cell anemia causes organ damage and pain. It also increases the risk of infection. °What are the causes? °This condition is caused by a gene that is passed from parent to child (inherited). Receiving two copies of the gene causes the disease. Receiving one copy causes the "trait," which means that symptoms are milder or not present. °What increases the risk? °This condition is more likely to develop if your ancestors were from [place], the Mediterranean, [place] or [place], the [place], [place], or the [place]. °What are the signs or symptoms? °Symptoms of this condition include: °· Episodes of pain (crises), especially in the hands and feet, joints, back, chest, or abdomen. The pain can be triggered by: °? An illness, especially if there is dehydration. °? Doing an activity with great effort (overexertion). °? Exposure to extreme temperature changes. °? High altitude. °· Fatigue. °· Shortness of breath or difficulty breathing. °· Dizziness. °· Pale skin or yellowed skin (jaundice). °· Frequent bacterial infections. °· Pain and swelling in the hands and feet (hand-food syndrome). °· Prolonged, painful erection of the penis (priapism). °· Acute chest syndrome. Symptoms of this include: °? Chest pain. °? Fever. °? Cough. °? Fast breathing. °· Stroke. °· Decreased activity. °· Loss of appetite. °· Change in behavior. °· Headaches. °· Seizures. °· Vision changes. °· Skin ulcers. °· Heart disease. °· High blood pressure. °· Gallstones. °· Liver and kidney problems. °How is this diagnosed? °This condition is diagnosed with  blood tests that check for the gene that causes this condition. °How is this treated? °There is no cure for most cases of this condition. Treatment focuses on managing your symptoms and preventing complications of the disease. Your health care provider will work with you to identify the best treatment options for you based on an assessment of your condition. Treatment may include: °· Medicines, including: °? Pain medicines. °? Antibiotic medicines for infection. °? Medicines to increase the production of a protein in red blood cells that helps carry oxygen in the body (hemoglobin). °· Fluids to treat pain and swelling. °· Oxygen to treat acute chest syndrome. °· Blood transfusions to treat symptoms such as fatigue, stroke, and acute chest syndrome. °· Massage and physical therapy for pain. °· Regular tests to monitor your condition, such as blood tests, X-rays, CT scans, MRI scans, ultrasounds, and lung function tests. These should be done every 3-12 months, depending on your age. °· Hematopoietic stem cell transplant. This is a procedure to replace abnormal stem cells with healthy stem cells from a donor's bone marrow. Stem cells are cells that can develop into blood cells, and bone marrow is the spongy tissue inside the bones. °Follow these instructions at home: °Medicines °· Take over-the-counter and prescription medicines only as told by your health care provider. °· If you were prescribed an antibiotic medicine, take it as told by your health care provider. Do not stop taking the antibiotic even if you start to feel better. °· If you develop a fever, do not take medicines to reduce the fever right away. This could cover up another problem. Notify your health care provider. °Managing   pain, stiffness, and swelling °· Try these methods to help ease your pain: °? Using a heating pad. °? Taking a warm bath. °? Distracting yourself, such as by watching TV. °Eating and drinking °· Drink enough fluid to keep your urine  clear or pale yellow. Drink more in hot weather and during exercise. °· Limit or avoid drinking alcohol. °· Eat a balanced and nutritious diet. Eat plenty of fruits, vegetables, whole grains, and lean protein. °· Take vitamins and supplements as directed by your health care provider. °Traveling °· When traveling, keep these with you: °? Your medical information. °? The names of your health care providers. °? Your medicines. °· If you have to travel by air, ask about precautions you should take. °Activity °· Get plenty of rest. °· Avoid activities that will lower your oxygen levels, such as exercising vigorously. °General instructions °· Do not use any products that contain nicotine or tobacco, such as cigarettes and e-cigarettes. They lower blood oxygen levels. If you need help quitting, ask your health care provider. °· Consider wearing a medical alert bracelet. °· Avoid high altitudes. °· Avoid extreme temperatures and extreme temperature changes. °· Keep all follow-up visits as told by your health care provider. This is important. °Contact a health care provider if: °· You develop joint pain. °· Your feet or hands swell or have pain. °· You have fatigue. °Get help right away if: °· You have symptoms of infection. These include: °? Fever. °? Chills. °? Extreme tiredness. °? Irritability. °? Poor eating. °? Vomiting. °· You feel dizzy or faint. °· You have new abdominal pain, especially on the left side near the stomach area. °· You develop priapism. °· You have numbness in your arms or legs or have trouble moving them. °· You have trouble talking. °· You develop pain that cannot be controlled with medicine. °· You become short of breath. °· You have rapid breathing. °· You have a persistent cough. °· You have pain in your chest. °· You develop a severe headache or stiff neck. °· You feel bloated without eating or after eating a small amount of food. °· Your skin is pale. °· You suddenly lose  vision. °Summary °· Sickle cell anemia is a condition in which red blood cells have an abnormal “sickle” shape. This disease can cause organ damage and chronic pain, and it can raise your risk of infection. °· Sickle cell anemia is a genetic disorder. °· Treatment focuses on managing your symptoms and preventing complications of the disease. °· Get medical help right away if you have any signs of infection, such as a fever. °This information is not intended to replace advice given to you by your health care provider. Make sure you discuss any questions you have with your health care provider. °Document Released: 05/10/2005 Document Revised: 05/24/2018 Document Reviewed: 03/07/2016 °[employer]vier Patient Education © 2020 [employer] ° °

## 2018-11-03 NOTE — Discharge Summary (Signed)
Physician Discharge Summary  [name] [name]. [id number] DOB: [birth year] DOA: 11/02/2018  PCP: [name], MD  Admit date: 11/02/2018  Discharge date: 11/03/2018  Discharge Diagnoses:  Principal Problem:   Sickle cell pain crisis ([place]) Active Problems:   TOBACCO ABUSE   CKD (chronic kidney disease), stage IV (HCC)   Anemia   Macrocytosis due to Hydroxyurea   Sickle cell crisis (HCC)   Abnormal liver function  Discharge Condition: Stable  Disposition:  Follow-up Information    [name], MD. Go in 2 day(s).   Specialty: Internal Medicine Contact information: [place] [address] [phone]        Buford Dresser, MD .   Specialty: Cardiology Contact information: [address] [place] [address] [phone]          Pt is discharged home in good condition and is to follow up with [name], MD this week to have labs evaluated. [name] is instructed to increase activity slowly and balance with rest for the next few days, and use prescribed medication to complete treatment of pain  Diet: Regular Wt Readings from Last 3 Encounters:  11/02/18 59.9 kg  10/01/18 59.9 kg  09/27/18 54.3 kg   History of present illness:  [name] [name]  is a 38 y.o. male,  w Sickle cell anemia, nephropathy, Immune-complex glomerulonephritis, CKD stage3, Jerrye Bushy, Migraines, h/o colitis 04/19/2017 ,  h/o R iliacus abscess which required perc drain 09/24/2018,  apparently c/o pain in the right foot and bilateral right and left distal lower ext,  Pt states this pain is consistent with his sickle cell crisis pain, and probably related to change in weather.  Pt denies dehydration, fever, chills, cough, cp, palp, sob, n/v, diarrhea, brbpr, dysuria.   In ED,  T 98.4, P 97 R 16, Bp 188/77 pox 100% on RA Wt 60kg  Wbc 7.8, Hgb 8.3, Plt 597   Na 139, K 4.8, Bun 25, Creatinine 2.36 Ast 41, Alt 40 Alk  phos 222, T. Bili 1.2 retic 6.8 (high)  Xray left foot/ ankle IMPRESSION: No fracture or dislocation of the left foot. There is redemonstrated, sclerosis and severe distal arthritic change of the left first metatarsal at the metatarsophalangeal joint. The remaining osseous structures, including the left first proximal phalanx, are intact. 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  IMPRESSION: No fracture or dislocation of the left foot. There is redemonstrated, sclerosis and severe distal arthritic change of the left first metatarsal at the metatarsophalangeal joint. The remaining osseous structures, including the left first proximal phalanx, are intact. 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  Pt received dilaudid 49m iv x6 without relief, along with benadryl 243miv x1 in the ED.   Ed requesting admission for sickle cell crisis.  Hospital Course:  Patient was admitted for sickle cell pain crisis and managed appropriately with IVF, IV Dilaudid via PCA as well as other adjunct therapies per sickle cell pain management protocols.  IV Toradol is contraindicated due to CKD.  Patient did well quickly on this regimen, pain returned to baseline quickly.  Hemoglobin was stable at baseline.  There is no clinical indication for blood transfusion during this admission.  When I saw this patient, pain is 0/10, vitals were stable, patient tolerating p.o. intake with no restrictions, ambulating well with  no significant pain.  Patient request to be discharged.  There is no contraindication to being discharged and he has outpatient appointment with PCP on Tuesday, 11/05/2018.  Patient was therefore discharged home today in a hemodynamically stable condition.  Patient will need a refill of his home pain  medications at the follow-up appointment and also repeat of CMP and CBC.  Discharge Exam: Vitals:   11/03/18 0517 11/03/18 0955  BP: 107/80 105/74  Pulse: 86 86  Resp: 12 16  Temp: (!) 97.4 F (36.3 C) 97.9 F (36.6 C)  SpO2: 100%    Vitals:   11/02/18 2226 11/03/18 0445 11/03/18 0517 11/03/18 0955  BP: 111/89  107/80 105/74  Pulse: 70  86 86  Resp: '14 11 12 16  ' Temp: 98.2 F (36.8 C)  (!) 97.4 F (36.3 C) 97.9 F (36.6 C)  TempSrc:    Oral  SpO2: 95% 99% 100%   Weight:      Height:        General appearance : Awake, alert, not in any distress. Speech Clear. Not toxic looking HEENT: Atraumatic and Normocephalic, pupils equally reactive to light and accomodation Neck: Supple, no JVD. No cervical lymphadenopathy.  Chest: Good air entry bilaterally, no added sounds  CVS: S1 S2 regular, no murmurs.  Abdomen: Bowel sounds present, Non tender and not distended with no gaurding, rigidity or rebound. Extremities: B/L Lower Ext shows no edema, both legs are warm to touch Neurology: Awake alert, and oriented X 3, CN II-XII intact, Non focal Skin: No Rash  Discharge Instructions  Discharge Instructions    Diet - low sodium heart healthy   Complete by: As directed    Increase activity slowly   Complete by: As directed      Allergies as of 11/03/2018      Reactions   Nsaids Other (See Comments)   Kidney disease   Morphine And Related Other (See Comments)   "Real bad headaches"       Medication List    TAKE these medications   cholecalciferol 25 MCG (1000 UT) tablet Commonly known as: VITAMIN D3 Take 2,000 Units by mouth daily.   CVS VITAMIN B12 1000 MCG tablet Generic drug: cyanocobalamin Take 2,000 mcg by mouth daily.   folic acid 1 MG tablet Commonly known as: FOLVITE Take 1 tablet (1 mg total) daily by mouth.   hydroxyurea 500 MG capsule Commonly known as: HYDREA TAKE 3 CAPSULES BY MOUTH DAILY What changed:   how much to take  how to take this  when  to take this  additional instructions   nortriptyline 25 MG capsule Commonly known as: PAMELOR Take 1 capsule (25 mg total) by mouth at bedtime as needed for sleep.   oxyCODONE 15 mg 12 hr tablet Commonly known as: OxyCONTIN Take 1 tablet (15 mg total) by mouth every 12 (twelve) hours. What changed: Another medication with the same name was changed. Make sure you understand how and when to take each.   Oxycodone HCl 10 MG Tabs Take 1 tablet (10 mg total) by mouth every 6 (six) hours as needed. What changed: reasons to take this       The results of significant diagnostics from this hospitalization (including imaging, microbiology, ancillary and laboratory) are listed below for reference.    Significant Diagnostic Studies: Dg Ankle Complete Left  Result Date: 11/02/2018 CLINICAL DATA:  Pain, sickle crisis EXAM: LEFT FOOT - COMPLETE 3+ VIEW; LEFT ANKLE COMPLETE - 3+ VIEW COMPARISON:  08/20/2018  FINDINGS: No fracture or dislocation of the left foot. There is redemonstrated, sclerosis and severe distal arthritic change of the left first metatarsal at the metatarsophalangeal joint. The remaining osseous structures, including the left first proximal phalanx, are intact. The joint spaces are preserved with the exception of the first metatarsophalangeal joint. Soft tissues are unremarkable. IMPRESSION: No fracture or dislocation of the left foot. There is redemonstrated, sclerosis and severe distal arthritic change of the left first metatarsal at the metatarsophalangeal joint. The remaining osseous structures, including the left first proximal phalanx, are intact. 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 Electronically Signed   By: Eddie Candle M.D.   On: 11/02/2018 17:28   Dg Foot Complete Left  Result Date: 11/02/2018 CLINICAL DATA:  Pain, sickle crisis EXAM: LEFT FOOT - COMPLETE 3+ VIEW;  LEFT ANKLE COMPLETE - 3+ VIEW COMPARISON:  08/20/2018 FINDINGS: No fracture or dislocation of the left foot. There is redemonstrated, sclerosis and severe distal arthritic change of the left first metatarsal at the metatarsophalangeal joint. The remaining osseous structures, including the left first proximal phalanx, are intact. The joint spaces are preserved with the exception of the first metatarsophalangeal joint. Soft tissues are unremarkable. IMPRESSION: No fracture or dislocation of the left foot. There is redemonstrated, sclerosis and severe distal arthritic change of the left first metatarsal at the metatarsophalangeal joint. The remaining osseous structures, including the left first proximal phalanx, are intact. 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 Electronically Signed   By: Eddie Candle M.D.   On: 11/02/2018 17:28   Ir Radiologist Eval & Mgmt  Result Date: 10/15/2018 Please refer to notes tab for details about interventional procedure. (Op Note)   Microbiology: Recent Results (from the past 240 hour(s))  SARS CORONAVIRUS 2 (TAT 6-24 HRS) Nasopharyngeal Nasopharyngeal Swab     Status: None   Collection Time: 11/02/18  9:34 PM   Specimen: Nasopharyngeal Swab  Result Value Ref Range Status   SARS Coronavirus 2 NEGATIVE NEGATIVE Final    Comment: (NOTE) SARS-CoV-2 target nucleic acids are NOT DETECTED. The SARS-CoV-2 RNA is generally detectable in upper and lower respiratory specimens during the acute phase of infection. Negative results do not preclude SARS-CoV-2 infection, do not rule out co-infections with other pathogens, and should not be used as the sole basis for treatment or other patient management decisions. Negative results must be combined with clinical observations, patient history, and epidemiological information. The expected result is Negative. Fact Sheet for  Patients: SugarRoll.be Fact Sheet for Healthcare Providers: https://www.woods-mathews.com/ This test is not yet approved or cleared by the [place] FDA and  has been authorized for detection and/or diagnosis of SARS-CoV-2 by FDA under an Emergency Use Authorization (EUA). This EUA will remain  in effect (meaning this test can be used) for the duration of the COVID-19 declaration under Section 56 4(b)(1) of the Act, 21 U.S.C. section 360bbb-3(b)(1), unless the authorization is terminated or revoked sooner. Performed at [facility], [place] [address], [place], [place] [address]      Labs: Basic Metabolic Panel: Recent Labs  Lab 11/02/18 1753 11/03/18 0535  NA 139 141  K 4.8 4.6  CL 111 113*  CO2 18* 19*  GLUCOSE 89 87  BUN 25* 25*  CREATININE 2.36* 2.24*  CALCIUM 8.9 9.0   Liver Function Tests: Recent Labs  Lab 11/02/18 1753 11/03/18 0535  AST 41 42*  ALT 40 40  ALKPHOS 222* 223*  BILITOT 1.2 1.7*  PROT 6.3* 6.3*  ALBUMIN 3.3* 3.3*   No results for input(s): LIPASE, AMYLASE in the last 168 hours. No results for input(s): AMMONIA in the last 168 hours. CBC: Recent Labs  Lab 11/02/18 1753 11/03/18 0535  WBC 7.8 8.7  NEUTROABS 3.8  --   HGB 8.3* 7.9*  HCT 25.1* 24.1*  MCV 119.0* 119.9*  PLT 597* 490*   Cardiac Enzymes: No results for input(s): CKTOTAL, CKMB, CKMBINDEX, TROPONINI in the last 168 hours. BNP: Invalid input(s): POCBNP CBG: No results for input(s): GLUCAP in the last 168 hours.  Time coordinating discharge: 50 minutes  Signed:  [facility] 11/03/2018, 10:40 AM

## 2018-11-04 ENCOUNTER — Telehealth: Payer: Self-pay | Admitting: Family Medicine

## 2018-11-05 ENCOUNTER — Non-Acute Institutional Stay (HOSPITAL_COMMUNITY)
Admission: AD | Admit: 2018-11-05 | Discharge: 2018-11-05 | Disposition: A | Discharge status: Home / Self Care | Payer: Medicare Other | Source: Ambulatory Visit | Attending: Internal Medicine | Admitting: Internal Medicine

## 2018-11-05 ENCOUNTER — Telehealth (HOSPITAL_COMMUNITY): Payer: Self-pay

## 2018-11-05 ENCOUNTER — Encounter (HOSPITAL_COMMUNITY): Payer: Self-pay

## 2018-11-05 ENCOUNTER — Ambulatory Visit: Payer: Medicare Other | Admitting: Family Medicine

## 2018-11-05 ENCOUNTER — Other Ambulatory Visit: Payer: Self-pay | Admitting: Family Medicine

## 2018-11-05 DIAGNOSIS — N184 Chronic kidney disease, stage 4 (severe): Secondary | ICD-10-CM | POA: Insufficient documentation

## 2018-11-05 DIAGNOSIS — Z79899 Other long term (current) drug therapy: Secondary | ICD-10-CM | POA: Insufficient documentation

## 2018-11-05 DIAGNOSIS — G894 Chronic pain syndrome: Secondary | ICD-10-CM

## 2018-11-05 DIAGNOSIS — Z886 Allergy status to analgesic agent status: Secondary | ICD-10-CM | POA: Diagnosis not present

## 2018-11-05 DIAGNOSIS — F1120 Opioid dependence, uncomplicated: Secondary | ICD-10-CM | POA: Insufficient documentation

## 2018-11-05 DIAGNOSIS — F17210 Nicotine dependence, cigarettes, uncomplicated: Secondary | ICD-10-CM | POA: Diagnosis not present

## 2018-11-05 DIAGNOSIS — M79672 Pain in left foot: Secondary | ICD-10-CM | POA: Insufficient documentation

## 2018-11-05 DIAGNOSIS — D5700 Hb-SS disease with crisis, unspecified: Secondary | ICD-10-CM | POA: Insufficient documentation

## 2018-11-05 DIAGNOSIS — Z885 Allergy status to narcotic agent status: Secondary | ICD-10-CM | POA: Insufficient documentation

## 2018-11-05 DIAGNOSIS — D571 Sickle-cell disease without crisis: Secondary | ICD-10-CM

## 2018-11-05 LAB — CBC WITH DIFFERENTIAL/PLATELET
Abs Immature Granulocytes: 0.01 10*3/uL (ref 0.00–0.07)
Basophils Absolute: 0 10*3/uL (ref 0.0–0.1)
Basophils Relative: 0 %
Eosinophils Absolute: 0 10*3/uL (ref 0.0–0.5)
Eosinophils Relative: 0 %
HCT: 32.1 % — ABNORMAL LOW (ref 39.0–52.0)
Hemoglobin: 11.1 g/dL — ABNORMAL LOW (ref 13.0–17.0)
Immature Granulocytes: 0 %
Lymphocytes Relative: 37 %
Lymphs Abs: 1.4 10*3/uL (ref 0.7–4.0)
MCH: 40.4 pg — ABNORMAL HIGH (ref 26.0–34.0)
MCHC: 34.6 g/dL (ref 30.0–36.0)
MCV: 116.7 fL — ABNORMAL HIGH (ref 80.0–100.0)
Monocytes Absolute: 0.2 10*3/uL (ref 0.1–1.0)
Monocytes Relative: 5 %
Neutro Abs: 2.3 10*3/uL (ref 1.7–7.7)
Neutrophils Relative %: 58 %
Platelets: 260 10*3/uL (ref 150–400)
RBC: 2.75 MIL/uL — ABNORMAL LOW (ref 4.22–5.81)
WBC: 3.9 10*3/uL — ABNORMAL LOW (ref 4.0–10.5)
nRBC: 2.5 % — ABNORMAL HIGH (ref 0.0–0.2)

## 2018-11-05 LAB — COMPREHENSIVE METABOLIC PANEL
ALT: 31 U/L (ref 0–44)
AST: 33 U/L (ref 15–41)
Albumin: 3 g/dL — ABNORMAL LOW (ref 3.5–5.0)
Alkaline Phosphatase: 192 U/L — ABNORMAL HIGH (ref 38–126)
Anion gap: 9 (ref 5–15)
BUN: 21 mg/dL — ABNORMAL HIGH (ref 6–20)
CO2: 22 mmol/L (ref 22–32)
Calcium: 8.3 mg/dL — ABNORMAL LOW (ref 8.9–10.3)
Chloride: 106 mmol/L (ref 98–111)
Creatinine, Ser: 1.98 mg/dL — ABNORMAL HIGH (ref 0.61–1.24)
GFR calc Af Amer: 49 mL/min — ABNORMAL LOW (ref >60)
GFR calc non Af Amer: 43 mL/min — ABNORMAL LOW (ref >60)
Glucose, Bld: 96 mg/dL (ref 70–99)
Potassium: 3.8 mmol/L (ref 3.5–5.1)
Sodium: 137 mmol/L (ref 135–145)
Total Bilirubin: 1.5 mg/dL — ABNORMAL HIGH (ref 0.3–1.2)
Total Protein: 5.8 g/dL — ABNORMAL LOW (ref 6.5–8.1)

## 2018-11-05 LAB — RETICULOCYTES
Immature Retic Fract: 28.1 % — ABNORMAL HIGH (ref 2.3–15.9)
RBC.: 2.75 MIL/uL — ABNORMAL LOW (ref 4.22–5.81)
Retic Count, Absolute: 134.2 10*3/uL (ref 19.0–186.0)
Retic Ct Pct: 4.9 % — ABNORMAL HIGH (ref 0.4–3.1)

## 2018-11-05 MED ORDER — SODIUM CHLORIDE 0.9 % IV SOLN
25.0000 mg | INTRAVENOUS | Status: DC | PRN
  Filled 2018-11-05: qty 0.5

## 2018-11-05 MED ORDER — SODIUM CHLORIDE 0.45 % IV SOLN
INTRAVENOUS | Status: DC
  Administered 2018-11-05: 10:00:00 via INTRAVENOUS

## 2018-11-05 MED ORDER — HYDROMORPHONE 1 MG/ML IV SOLN
INTRAVENOUS | Status: DC
  Administered 2018-11-05: 6 mg via INTRAVENOUS
  Administered 2018-11-05: 30 mg via INTRAVENOUS
  Filled 2018-11-05: qty 30

## 2018-11-05 MED ORDER — DIPHENHYDRAMINE HCL 25 MG PO CAPS: 25.0000 mg | ORAL_CAPSULE | ORAL | Status: DC | PRN

## 2018-11-05 MED ORDER — NALOXONE HCL 0.4 MG/ML IJ SOLN: 0.4000 mg | INTRAMUSCULAR | Status: DC | PRN

## 2018-11-05 MED ORDER — OXYCODONE HCL ER 15 MG PO T12A: 15.0000 mg | EXTENDED_RELEASE_TABLET | Freq: Two times a day (BID) | ORAL | 0 refills | Status: DC

## 2018-11-05 MED ORDER — SODIUM CHLORIDE 0.9% FLUSH: 9.0000 mL | INTRAVENOUS | Status: DC | PRN

## 2018-11-05 MED ORDER — OXYCODONE HCL 10 MG PO TABS: 10.0000 mg | ORAL_TABLET | Freq: Four times a day (QID) | ORAL | 0 refills | Status: DC | PRN

## 2018-11-05 MED ORDER — ONDANSETRON HCL 4 MG/2ML IJ SOLN: 4.0000 mg | Freq: Four times a day (QID) | INTRAMUSCULAR | Status: DC | PRN

## 2018-11-05 NOTE — Telephone Encounter (Signed)
error 

## 2018-11-05 NOTE — Progress Notes (Signed)
Patient admitted to the day hospital for treatment of sickle cell pain. Patient reported pain in the left leg and foot. Rated pain 8/10.  For pain management, patient placed on a Dilaudid PCA and received IVF for hydration.  At discharge, patient report pain 5/10. Vitals stable.  Discharge instructions given. Alert, oriented and ambulatory at discharge.

## 2018-11-05 NOTE — Progress Notes (Signed)
Reviewed PDMP substance reporting system prior to prescribing opiate medications, no inconsistencies noted.  Meds ordered this encounter  Medications  . oxyCODONE (OXYCONTIN) 15 mg 12 hr tablet    Sig: Take 1 tablet (15 mg total) by mouth every 12 (twelve) hours.    Dispense:  60 tablet    Refill:  0    Order Specific Question:   Supervising Provider    Answer:   [name] [id number]  . Oxycodone HCl 10 MG TABS    Sig: Take 1 tablet (10 mg total) by mouth every 6 (six) hours as needed.    Dispense:  60 tablet    Refill:  0    Order Specific Question:   Supervising Provider    Answer:   [name] [id number]    [name]  APRN, MSN, FNP-C [facility] [address] [place], [place] [address] [phone]

## 2018-11-05 NOTE — H&P (Signed)
[facility] History and Physical   Date: 11/05/2018  Patient name: [name] [name]. Medical record number: [id number] Date of birth: Sep 21, 1980 Age: 38 y.o. Gender: male PCP: [name], MD  Attending physician: [name], MD  Chief Complaint: Sickle cell pain  History of Present Illness: [name] [name], a 38 year old male with a medical history significant for sickle cell disease, chronic pain syndrome, opiate dependence, history of anemia of chronic disease, history of iliopsoas abscess, and history of cocaine use presents complaining of left foot pain that is consistent with his typical sickle cell crisis.  Patient was recently discharged from hospital on 11/03/2018 for this problem.  He says that he was discharged without complete resolution.  Pain intensity was elevated upon awakening this a.m.  He also endorses swelling to left lower extremity, mainly his ankle.  He states that pain is worsened with weightbearing.  Current pain intensity is 10/10 characterized as constant and throbbing. Patient denies recent travel, sick contacts, or exposure to COVID-19.  He also denies headache, dizziness, chest pain, sore throat, persistent cough shortness of breath, dysuria, nausea, vomiting, or diarrhea.  Meds: Medications Prior to Admission  Medication Sig Dispense Refill Last Dose  . cholecalciferol (VITAMIN D3) 25 MCG (1000 UT) tablet Take 2,000 Units by mouth daily.     . CVS VITAMIN B12 1000 MCG tablet Take 2,000 mcg by mouth daily.     . folic acid (FOLVITE) 1 MG tablet Take 1 tablet (1 mg total) daily by mouth. 30 tablet 2   . hydroxyurea (HYDREA) 500 MG capsule TAKE 3 CAPSULES BY MOUTH DAILY (Patient taking differently: Take 1,500 mg by mouth at bedtime. ) 270 capsule 1   . nortriptyline (PAMELOR) 25 MG capsule Take 1 capsule (25 mg total) by mouth at bedtime as needed for sleep. 90 capsule 3   . oxyCODONE (OXYCONTIN) 15 mg 12 hr tablet Take 1  tablet (15 mg total) by mouth every 12 (twelve) hours. 60 tablet 0   . Oxycodone HCl 10 MG TABS Take 1 tablet (10 mg total) by mouth every 6 (six) hours as needed. (Patient taking differently: Take 10 mg by mouth every 6 (six) hours as needed (pain). ) 60 tablet 0     Allergies: Nsaids and Morphine and related Past Medical History:  Diagnosis Date  . Abscess of right iliac fossa 09/24/2018   required Perc Drain 09/24/2018  . Arachnoid Cyst of brain bilaterally    "2 really small ones in the back of my head; inside; saw them w/MRI" (09/25/2012)  . Bacterial pneumonia ~ 2012   "caught it here in the hospital" (09/25/2012)  . Chronic kidney disease    "from my sickle cell" (09/25/2012)  . CKD (chronic kidney disease), stage II   . Colitis 04/19/2017   CT scan abd/ pelvis  . GERD (gastroesophageal reflux disease)    "after I eat alot of spicey foods" (09/25/2012)  . Gynecomastia, male 07/10/2012  . History of blood transfusion    "always related to sickle cell crisis" (09/25/2012)  . 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   Noted in noted from Hematology notes at [facility]  . Migraines    "take RX qd to prevent them" (09/25/2012)  . Opioid dependence with withdrawal ([place]) 08/30/2012  . Sickle cell anemia (HCC)   . Sickle cell crisis ([place]) 09/25/2012  . Sickle cell nephropathy ([place]) 07/10/2012  . Sinus tachycardia   . Tachycardia with heart rate 121-140 beats  per minute with ambulation 08/04/2016   Past Surgical History:  Procedure Laterality Date  . ABCESS DRAINAGE    . CHOLECYSTECTOMY  ~ 2012  . COLONOSCOPY N/A 04/23/2017   Procedure: COLONOSCOPY;  Surgeon: Irene Shipper, MD;  Location: [facility];  Service: Endoscopy;  Laterality: N/A;  . IR FLUORO GUIDE CV LINE RIGHT  12/17/2016  . IR RADIOLOGIST EVAL & MGMT  10/02/2018  . IR RADIOLOGIST EVAL & MGMT  10/15/2018  . IR REMOVAL TUN CV CATH W/O FL  12/21/2016  . IR US GUIDE VASC ACCESS RIGHT  12/17/2016  . spleenectomy      Family History  Problem Relation Age of Onset  . Breast cancer Mother    Social History   Socioeconomic History  . Marital status: Single    Spouse name: Not on file  . Number of children: Not on file  . Years of education: Not on file  . Highest education level: Not on file  Occupational History  . Not on file  Social Needs  . Financial resource strain: Not on file  . Food insecurity    Worry: Not on file    Inability: Not on file  . Transportation needs    Medical: Not on file    Non-medical: Not on file  Tobacco Use  . Smoking status: Current Some Day Smoker    Packs/day: 0.10    Types: Cigarettes  . Smokeless tobacco: Never Used  . Tobacco comment: 09/25/2012 "I don't buy cigarettes; bum one from friends q now and then"  Substance and Sexual Activity  . Alcohol use: Yes    Alcohol/week: 0.0 standard drinks    Frequency: Never    Comment: Once a month  . Drug use: Not Currently    Types: Marijuana    Comment: Every 2-3 weeks   . Sexual activity: Yes  Lifestyle  . Physical activity    Days per week: Not on file    Minutes per session: Not on file  . Stress: Not on file  Relationships  . Social Herbalist on phone: Not on file    Gets together: Not on file    Attends religious service: Not on file    Active member of club or organization: Not on file    Attends meetings of clubs or organizations: Not on file    Relationship status: Not on file  . Intimate partner violence    Fear of current or ex partner: Not on file    Emotionally abused: Not on file    Physically abused: Not on file    Forced sexual activity: Not on file  Other Topics Concern  . Not on file  Social History Narrative    Lives alone in [place].   Back at school, studying Public relations account executive. Unemployed.    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    Review of Systems  Constitutional: Negative for chills and fever.  HENT: Negative.   Respiratory: Negative.   Cardiovascular: Negative for chest pain and palpitations.  Gastrointestinal: Negative.   Genitourinary: Negative.   Musculoskeletal: Positive for joint pain (Left foot pain). Negative for myalgias and neck pain.  Skin: Negative.   Neurological: Negative.   Psychiatric/Behavioral: Negative for substance abuse and suicidal ideas.    Physical Exam: Blood pressure 111/65, pulse 98, temperature  98.5 F (36.9 C), temperature source Oral, resp. rate 18, SpO2 100 %. Physical Exam Constitutional:      Appearance: Normal appearance.  HENT:     Head: Normocephalic.     Nose: Nose normal.     Mouth/Throat:     Mouth: Mucous membranes are moist.     Pharynx: Oropharynx is clear.  Cardiovascular:     Rate and Rhythm: Normal rate and regular rhythm.     Pulses: Normal pulses.     Heart sounds: Normal heart sounds.  Pulmonary:     Effort: Pulmonary effort is normal.     Breath sounds: Normal breath sounds.  Abdominal:     General: Abdomen is flat. Bowel sounds are normal.     Palpations: Abdomen is soft.  Musculoskeletal:     Left ankle: He exhibits decreased range of motion and swelling. He exhibits normal pulse.  Skin:    General: Skin is warm.  Neurological:     General: No focal deficit present.     Mental Status: He is alert. Mental status is at baseline.  Psychiatric:        Mood and Affect: Mood normal.        Behavior: Behavior normal.        Thought Content: Thought content normal.        Judgment: Judgment normal.      Lab results: Results for orders placed or performed during the hospital encounter of 11/05/18 (from the past 24 hour(s))  CBC with Differential/Platelet     Status: Abnormal   Collection Time: 11/05/18  9:31 AM  Result Value Ref Range   WBC 3.9 (L) 4.0 - 10.5 K/uL   RBC 2.75 (L) 4.22 - 5.81 MIL/uL   Hemoglobin 11.1 (L)  13.0 - 17.0 g/dL   HCT 32.1 (L) 39.0 - 52.0 %   MCV 116.7 (H) 80.0 - 100.0 fL   MCH 40.4 (H) 26.0 - 34.0 pg   MCHC 34.6 30.0 - 36.0 g/dL   RDW Not Measured 11.5 - 15.5 %   Platelets 260 150 - 400 K/uL   nRBC 2.5 (H) 0.0 - 0.2 %   Neutrophils Relative % 58 %   Neutro Abs 2.3 1.7 - 7.7 K/uL   Lymphocytes Relative 37 %   Lymphs Abs 1.4 0.7 - 4.0 K/uL   Monocytes Relative 5 %   Monocytes Absolute 0.2 0.1 - 1.0 K/uL   Eosinophils Relative 0 %   Eosinophils Absolute 0.0 0.0 - 0.5 K/uL   Basophils Relative 0 %   Basophils Absolute 0.0 0.0 - 0.1 K/uL   Immature Granulocytes 0 %   Abs Immature Granulocytes 0.01 0.00 - 0.07 K/uL   Tammy Sours Bodies PRESENT    Polychromasia PRESENT    Sickle Cells PRESENT    Target Cells PRESENT   Comprehensive metabolic panel     Status: Abnormal   Collection Time: 11/05/18  9:31 AM  Result Value Ref Range   Sodium 137 135 - 145 mmol/L   Potassium 3.8 3.5 - 5.1 mmol/L   Chloride 106 98 - 111 mmol/L   CO2 22 22 - 32 mmol/L   Glucose, Bld 96 70 - 99 mg/dL   BUN 21 (H) 6 - 20 mg/dL   Creatinine, Ser 1.98 (H) 0.61 - 1.24 mg/dL   Calcium 8.3 (L) 8.9 - 10.3 mg/dL   Total Protein 5.8 (L) 6.5 - 8.1 g/dL   Albumin 3.0 (L) 3.5 - 5.0 g/dL   AST 33  15 - 41 U/L   ALT 31 0 - 44 U/L   Alkaline Phosphatase 192 (H) 38 - 126 U/L   Total Bilirubin 1.5 (H) 0.3 - 1.2 mg/dL   GFR calc non Af Amer 43 (L) >60 mL/min   GFR calc Af Amer 49 (L) >60 mL/min   Anion gap 9 5 - 15  Reticulocytes     Status: Abnormal   Collection Time: 11/05/18  9:31 AM  Result Value Ref Range   Retic Ct Pct 4.9 (H) 0.4 - 3.1 %   RBC. 2.75 (L) 4.22 - 5.81 MIL/uL   Retic Count, Absolute 134.2 19.0 - 186.0 K/uL   Immature Retic Fract 28.1 (H) 2.3 - 15.9 %    Imaging results:  No results found.   Assessment & Plan: Patient admitted to sickle cell day infusion center for management of pain crisis. He is opiate tolerant, initiate IV Dilaudid PCA per weight-based protocol.  Settings of  0.5 mg, 10-minute lockout, and 3 mg/h. Hold IV Toradol due to stage IV chronic kidney disease. IV fluids, 0.45% saline at 50 mL/h. Review CMP, CBC with differential, and reticulocytes as results become available. Patient will be reevaluated frequently in context of functioning and relationship to baseline as his care progresses. If pain intensity remains elevated, transition to inpatient services for higher level of care.   [name]  APRN, MSN, FNP-C Patient [facility] [address] [place], [place] [address] [phone]  11/05/2018, 1:05 PM

## 2018-11-05 NOTE — Telephone Encounter (Signed)
[name] [name] called with complaints of sickle cell pain to left leg and foot. Stated foot was swollen and rated pain 8/10. Patient also stated he just had an inpatient stay and did not want to be admitted again and he needs fluid and pain medicine. Patient did not report fever, chest pain, N/V/D, abdominal pain, priapism or COVID-19 symptoms. Chart reviewed with provider and advise to come in for treatment.

## 2018-11-05 NOTE — Discharge Summary (Signed)
[facility] Discharge Summary   Patient ID: [name] [name]. MRN: [id number] DOB/AGE: [birth year] 38 y.o.  Admit date: 11/05/2018 Discharge date: 11/05/2018  Primary Care Physician:  [name], MD  Admission Diagnoses:  Active Problems:   Sickle cell crisis [facility])    Discharge Medications:  Allergies as of 11/05/2018      Reactions   Nsaids Other (See Comments)   Kidney disease   Morphine And Related Other (See Comments)   "Real bad headaches"       Medication List    TAKE these medications   cholecalciferol 25 MCG (1000 UT) tablet Commonly known as: VITAMIN D3 Take 2,000 Units by mouth daily.   [facility] VITAMIN B12 1000 MCG tablet Generic drug: cyanocobalamin Take 2,000 mcg by mouth daily.   folic acid 1 MG tablet Commonly known as: FOLVITE Take 1 tablet (1 mg total) daily by mouth.   hydroxyurea 500 MG capsule Commonly known as: HYDREA TAKE 3 CAPSULES BY MOUTH DAILY What changed:   how much to take  how to take this  when to take this  additional instructions   nortriptyline 25 MG capsule Commonly known as: PAMELOR Take 1 capsule (25 mg total) by mouth at bedtime as needed for sleep.   oxyCODONE 15 mg 12 hr tablet Commonly known as: OxyCONTIN Take 1 tablet (15 mg total) by mouth every 12 (twelve) hours. What changed: Another medication with the same name was changed. Make sure you understand how and when to take each.   Oxycodone HCl 10 MG Tabs Take 1 tablet (10 mg total) by mouth every 6 (six) hours as needed. What changed: reasons to take this        Consults:  None  Significant Diagnostic Studies:  Dg Ankle Complete Left  Result Date: 11/02/2018 CLINICAL DATA:  Pain, sickle crisis EXAM: LEFT FOOT - COMPLETE 3+ VIEW; LEFT ANKLE COMPLETE - 3+ VIEW COMPARISON:  08/20/2018 FINDINGS: No fracture or dislocation of the left foot. There is redemonstrated, sclerosis and severe distal arthritic change of the left first  metatarsal at the metatarsophalangeal joint. The remaining osseous structures, including the left first proximal phalanx, are intact. The joint spaces are preserved with the exception of the first metatarsophalangeal joint. Soft tissues are unremarkable. IMPRESSION: No fracture or dislocation of the left foot. There is redemonstrated, sclerosis and severe distal arthritic change of the left first metatarsal at the metatarsophalangeal joint. The remaining osseous structures, including the left first proximal phalanx, are intact. 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 Electronically Signed   By: Eddie Candle M.D.   On: 11/02/2018 17:28   Dg Foot Complete Left  Result Date: 11/02/2018 CLINICAL DATA:  Pain, sickle crisis EXAM: LEFT FOOT - COMPLETE 3+ VIEW; LEFT ANKLE COMPLETE - 3+ VIEW COMPARISON:  08/20/2018 FINDINGS: No fracture or dislocation of the left foot. There is redemonstrated, sclerosis and severe distal arthritic change of the left first metatarsal at the metatarsophalangeal joint. The remaining osseous structures, including the left first proximal phalanx, are intact. The joint spaces are preserved with the exception of the first metatarsophalangeal joint. Soft tissues are unremarkable. IMPRESSION: No fracture or dislocation of the left foot. There is redemonstrated, sclerosis and severe distal arthritic change of the left first metatarsal at the metatarsophalangeal joint. The remaining osseous structures, including the left first proximal phalanx, are intact. Findings are unchanged compared to examination dated 08/20/2018 and remain consistent  with posttraumatic arthrosis or advanced avascular necrosis. Electronically Signed   By: Eddie Candle M.D.   On: 11/02/2018 17:28   Ir Radiologist Eval & Mgmt  Result Date: 10/15/2018 Please refer to notes tab for details about interventional procedure.  (Op Note)  History of present illness:  [name] [name], a 38 year old male with a medical history significant for sickle cell disease, chronic pain syndrome, opiate dependence, history of anemia of chronic disease, history of iliopsoas abscess, and history of cocaine use presents complaining of left foot pain that is consistent with his typical sickle cell crisis.  Patient was discharged from hospital on 11/03/2018 for this problem.  He says he was discharged without complete resolution of problem.  Pain intensity was elevated upon awakening this a.m.  He endorses swelling to left lower extremity, mainly ankle.  He states that pain is worsened with weightbearing.  Current pain intensity is 10/10 and characterized as constant and throbbing. Patient denies recent travel, sick contacts, or exposure to COVID-19.  He also denies headache, dizziness, chest pain, sore throat, persistent cough, shortness of breath, dysuria, nausea, vomiting, or diarrhea.  [facility] Course: Patient admitted to sickle cell day infusion center for management of pain crisis. All laboratory values reviewed. Patient's hemoglobin is 11.1, which is increased from his baseline.  Patient has a history of chronic kidney disease, stage IV, creatinine 1.98, which is improved from previous. Patient is afebrile and maintaining oxygen saturation above 90% on RA. Pain managed with IV Dilaudid via PCA.  Settings were 0.5 mg, 10-minute lockout, and 3 mg/h. IV fluids, 0.45% saline at 50 mL/h Patient says that pain intensity decreased to 4-5/10, which is consistent with his baseline. Patient alert, oriented, and ambulating without assistance. He was discharged home in a hemodynamically stable condition.  Discharge instructions: Resume all home medications.  Follow up with PCP as previously  scheduled.   Discussed the importance of drinking 64 ounces of water daily to  help prevent pain crises, it is important to drink  plenty of water throughout the day. This is because dehydration of red blood cells may lead further sickling.   Avoid all stressors that precipitate sickle cell pain crisis.     The patient was given clear instructions to go to ER or return to medical center if symptoms do not improve, worsen or new problems develop.    Physical Exam at Discharge:  BP 105/60 (BP Location: Right Arm)   Pulse 97   Temp 98.5 F (36.9 C) (Oral)   Resp 13   SpO2 100%  Physical Exam Constitutional:      Appearance: Normal appearance.  HENT:     Mouth/Throat:     Mouth: Mucous membranes are moist.  Cardiovascular:     Rate and Rhythm: Normal rate and regular rhythm.     Heart sounds: Murmur present.  Pulmonary:     Effort: Pulmonary effort is normal.  Abdominal:     General: Bowel sounds are normal.     Palpations: Abdomen is soft.  Musculoskeletal: Normal range of motion.  Skin:    General: Skin is warm.  Neurological:     General: No focal deficit present.     Mental Status: He is alert and oriented to person, place, and time. Mental status is at baseline.  Psychiatric:        Mood and Affect: Mood normal.        Behavior: Behavior normal.        Thought Content:  Thought content normal.        Judgment: Judgment normal.      Disposition at Discharge: Discharge disposition: 01-Home or Self Care       Discharge Orders: Discharge Instructions    Discharge patient   Complete by: As directed    Discharge disposition: 01-Home or Self Care   Discharge patient date: 11/05/2018      Condition at Discharge:   Stable  Time spent on Discharge:  Greater than 30 minutes.  Signed: [name]  APRN, MSN, FNP-C [facility] [address] [place], [place] [address] [phone]  11/05/2018, 3:50 PM

## 2018-11-05 NOTE — Discharge Instructions (Signed)
Sickle Cell Anemia, Adult ° °Sickle cell anemia is a condition where your red blood cells are shaped like sickles. Red blood cells carry oxygen through the body. Sickle-shaped cells do not live as long as normal red blood cells. They also clump together and block blood from flowing through the blood vessels. This prevents the body from getting enough oxygen. Sickle cell anemia causes organ damage and pain. It also increases the risk of infection. °Follow these instructions at home: °Medicines °· Take over-the-counter and prescription medicines only as told by your doctor. °· If you were prescribed an antibiotic medicine, take it as told by your doctor. Do not stop taking the antibiotic even if you start to feel better. °· If you develop a fever, do not take medicines to lower the fever right away. Tell your doctor about the fever. °Managing pain, stiffness, and swelling °· Try these methods to help with pain: °? Use a heating pad. °? Take a warm bath. °? Distract yourself, such as by watching TV. °Eating and drinking °· Drink enough fluid to keep your pee (urine) clear or pale yellow. Drink more in hot weather and during exercise. °· Limit or avoid alcohol. °· Eat a healthy diet. Eat plenty of fruits, vegetables, whole grains, and lean protein. °· Take vitamins and supplements as told by your doctor. °Traveling °· When traveling, keep these with you: °? Your medical information. °? The names of your doctors. °? Your medicines. °· If you need to take an airplane, talk to your doctor first. °Activity °· Rest often. °· Avoid exercises that make your heart beat much faster, such as jogging. °General instructions °· Do not use products that have nicotine or tobacco, such as cigarettes and e-cigarettes. If you need help quitting, ask your doctor. °· Consider wearing a medical alert bracelet. °· Avoid being in high places (high altitudes), such as mountains. °· Avoid very hot or cold temperatures. °· Avoid places where the  temperature changes a lot. °· Keep all follow-up visits as told by your doctor. This is important. °Contact a doctor if: °· A joint hurts. °· Your feet or hands hurt or swell. °· You feel tired (fatigued). °Get help right away if: °· You have symptoms of infection. These include: °? Fever. °? Chills. °? Being very tired. °? Irritability. °? Poor eating. °? Throwing up (vomiting). °· You feel dizzy or faint. °· You have new stomach pain, especially on the left side. °· You have a an erection (priapism) that lasts more than 4 hours. °· You have numbness in your arms or legs. °· You have a hard time moving your arms or legs. °· You have trouble talking. °· You have pain that does not go away when you take medicine. °· You are short of breath. °· You are breathing fast. °· You have a long-term cough. °· You have pain in your chest. °· You have a bad headache. °· You have a stiff neck. °· Your stomach looks bloated even though you did not eat much. °· Your skin is pale. °· You suddenly cannot see well. °Summary °· Sickle cell anemia is a condition where your red blood cells are shaped like sickles. °· Follow your doctor's advice on ways to manage pain, food to eat, activities to do, and steps to take for safe travel. °· Get medical help right away if you have any signs of infection, such as a fever. °This information is not intended to replace advice given to you by   your health care provider. Make sure you discuss any questions you have with your health care provider. °Document Released: 11/20/2012 Document Revised: 05/24/2018 Document Reviewed: 03/07/2016 °[employer]vier Patient Education © 2020 [employer] ° °

## 2018-11-12 DIAGNOSIS — N049 Nephrotic syndrome with unspecified morphologic changes: Secondary | ICD-10-CM | POA: Diagnosis not present

## 2018-11-12 DIAGNOSIS — D5700 Hb-SS disease with crisis, unspecified: Secondary | ICD-10-CM | POA: Diagnosis not present

## 2018-11-12 DIAGNOSIS — D631 Anemia in chronic kidney disease: Secondary | ICD-10-CM | POA: Diagnosis not present

## 2018-11-12 DIAGNOSIS — E875 Hyperkalemia: Secondary | ICD-10-CM | POA: Diagnosis not present

## 2018-11-12 DIAGNOSIS — N183 Chronic kidney disease, stage 3 (moderate): Secondary | ICD-10-CM | POA: Diagnosis not present

## 2018-11-12 DIAGNOSIS — E559 Vitamin D deficiency, unspecified: Secondary | ICD-10-CM | POA: Diagnosis not present

## 2018-11-12 DIAGNOSIS — E872 Acidosis: Secondary | ICD-10-CM | POA: Diagnosis not present

## 2018-11-12 DIAGNOSIS — R809 Proteinuria, unspecified: Secondary | ICD-10-CM | POA: Diagnosis not present

## 2018-11-20 ENCOUNTER — Inpatient Hospital Stay (HOSPITAL_COMMUNITY)
Admission: EM | Admit: 2018-11-20 | Discharge: 2018-11-22 | DRG: 812 | Disposition: A | Discharge status: Home / Self Care | Payer: Medicare Other | Attending: Internal Medicine | Admitting: Internal Medicine

## 2018-11-20 ENCOUNTER — Observation Stay (HOSPITAL_COMMUNITY): Payer: Medicare Other

## 2018-11-20 ENCOUNTER — Encounter (HOSPITAL_COMMUNITY): Payer: Self-pay | Admitting: Emergency Medicine

## 2018-11-20 ENCOUNTER — Emergency Department (HOSPITAL_COMMUNITY): Payer: Medicare Other

## 2018-11-20 ENCOUNTER — Other Ambulatory Visit: Payer: Self-pay

## 2018-11-20 DIAGNOSIS — Z79891 Long term (current) use of opiate analgesic: Secondary | ICD-10-CM | POA: Diagnosis not present

## 2018-11-20 DIAGNOSIS — J984 Other disorders of lung: Secondary | ICD-10-CM | POA: Diagnosis not present

## 2018-11-20 DIAGNOSIS — R7401 Elevation of levels of liver transaminase levels: Secondary | ICD-10-CM | POA: Diagnosis present

## 2018-11-20 DIAGNOSIS — D649 Anemia, unspecified: Secondary | ICD-10-CM | POA: Diagnosis not present

## 2018-11-20 DIAGNOSIS — N179 Acute kidney failure, unspecified: Secondary | ICD-10-CM | POA: Diagnosis not present

## 2018-11-20 DIAGNOSIS — Z79899 Other long term (current) drug therapy: Secondary | ICD-10-CM | POA: Diagnosis not present

## 2018-11-20 DIAGNOSIS — N289 Disorder of kidney and ureter, unspecified: Secondary | ICD-10-CM | POA: Diagnosis not present

## 2018-11-20 DIAGNOSIS — D571 Sickle-cell disease without crisis: Secondary | ICD-10-CM

## 2018-11-20 DIAGNOSIS — D631 Anemia in chronic kidney disease: Secondary | ICD-10-CM | POA: Diagnosis present

## 2018-11-20 DIAGNOSIS — N08 Glomerular disorders in diseases classified elsewhere: Secondary | ICD-10-CM | POA: Diagnosis present

## 2018-11-20 DIAGNOSIS — Z20828 Contact with and (suspected) exposure to other viral communicable diseases: Secondary | ICD-10-CM | POA: Diagnosis present

## 2018-11-20 DIAGNOSIS — R2242 Localized swelling, mass and lump, left lower limb: Secondary | ICD-10-CM

## 2018-11-20 DIAGNOSIS — Z803 Family history of malignant neoplasm of breast: Secondary | ICD-10-CM

## 2018-11-20 DIAGNOSIS — G894 Chronic pain syndrome: Secondary | ICD-10-CM | POA: Diagnosis present

## 2018-11-20 DIAGNOSIS — R911 Solitary pulmonary nodule: Secondary | ICD-10-CM

## 2018-11-20 DIAGNOSIS — D5700 Hb-SS disease with crisis, unspecified: Secondary | ICD-10-CM | POA: Diagnosis not present

## 2018-11-20 DIAGNOSIS — N184 Chronic kidney disease, stage 4 (severe): Secondary | ICD-10-CM | POA: Diagnosis present

## 2018-11-20 DIAGNOSIS — M19072 Primary osteoarthritis, left ankle and foot: Secondary | ICD-10-CM | POA: Diagnosis present

## 2018-11-20 DIAGNOSIS — M79673 Pain in unspecified foot: Secondary | ICD-10-CM

## 2018-11-20 DIAGNOSIS — D638 Anemia in other chronic diseases classified elsewhere: Secondary | ICD-10-CM | POA: Diagnosis present

## 2018-11-20 DIAGNOSIS — R000 Tachycardia, unspecified: Secondary | ICD-10-CM | POA: Diagnosis not present

## 2018-11-20 HISTORY — DX: Disorder of kidney and ureter, unspecified: N28.9

## 2018-11-20 LAB — RETICULOCYTES
Immature Retic Fract: 29.6 % — ABNORMAL HIGH (ref 2.3–15.9)
RBC.: 1.59 MIL/uL — ABNORMAL LOW (ref 4.22–5.81)
Retic Count, Absolute: 113.2 10*3/uL (ref 19.0–186.0)
Retic Ct Pct: 7.1 % — ABNORMAL HIGH (ref 0.4–3.1)

## 2018-11-20 LAB — CBC WITH DIFFERENTIAL/PLATELET
Abs Immature Granulocytes: 0.03 10*3/uL (ref 0.00–0.07)
Basophils Absolute: 0 10*3/uL (ref 0.0–0.1)
Basophils Relative: 0 %
Eosinophils Absolute: 0.2 10*3/uL (ref 0.0–0.5)
Eosinophils Relative: 3 %
HCT: 19.9 % — ABNORMAL LOW (ref 39.0–52.0)
Hemoglobin: 6.6 g/dL — CL (ref 13.0–17.0)
Immature Granulocytes: 0 %
Lymphocytes Relative: 42 %
Lymphs Abs: 3.4 10*3/uL (ref 0.7–4.0)
MCH: 41.5 pg — ABNORMAL HIGH (ref 26.0–34.0)
MCHC: 33.2 g/dL (ref 30.0–36.0)
MCV: 125.2 fL — ABNORMAL HIGH (ref 80.0–100.0)
Monocytes Absolute: 0.3 10*3/uL (ref 0.1–1.0)
Monocytes Relative: 4 %
Neutro Abs: 4.1 10*3/uL (ref 1.7–7.7)
Neutrophils Relative %: 51 %
Platelets: 447 10*3/uL — ABNORMAL HIGH (ref 150–400)
RBC: 1.59 MIL/uL — ABNORMAL LOW (ref 4.22–5.81)
WBC: 8.1 10*3/uL (ref 4.0–10.5)
nRBC: 2.1 % — ABNORMAL HIGH (ref 0.0–0.2)

## 2018-11-20 LAB — COMPREHENSIVE METABOLIC PANEL
ALT: 75 U/L — ABNORMAL HIGH (ref 0–44)
AST: 57 U/L — ABNORMAL HIGH (ref 15–41)
Albumin: 3.1 g/dL — ABNORMAL LOW (ref 3.5–5.0)
Alkaline Phosphatase: 192 U/L — ABNORMAL HIGH (ref 38–126)
Anion gap: 11 (ref 5–15)
BUN: 15 mg/dL (ref 6–20)
CO2: 27 mmol/L (ref 22–32)
Calcium: 8.6 mg/dL — ABNORMAL LOW (ref 8.9–10.3)
Chloride: 102 mmol/L (ref 98–111)
Creatinine, Ser: 2.59 mg/dL — ABNORMAL HIGH (ref 0.61–1.24)
GFR calc Af Amer: 36 mL/min — ABNORMAL LOW (ref >60)
GFR calc non Af Amer: 31 mL/min — ABNORMAL LOW (ref >60)
Glucose, Bld: 92 mg/dL (ref 70–99)
Potassium: 3.9 mmol/L (ref 3.5–5.1)
Sodium: 140 mmol/L (ref 135–145)
Total Bilirubin: 1.2 mg/dL (ref 0.3–1.2)
Total Protein: 6 g/dL — ABNORMAL LOW (ref 6.5–8.1)

## 2018-11-20 LAB — URIC ACID: Uric Acid, Serum: 8.4 mg/dL (ref 3.7–8.6)

## 2018-11-20 LAB — TROPONIN I (HIGH SENSITIVITY): Troponin I (High Sensitivity): 11 ng/L (ref <18)

## 2018-11-20 LAB — HEMOGLOBIN AND HEMATOCRIT, BLOOD
HCT: 19.8 % — ABNORMAL LOW (ref 39.0–52.0)
Hemoglobin: 6.7 g/dL — CL (ref 13.0–17.0)

## 2018-11-20 LAB — PREPARE RBC (CROSSMATCH)

## 2018-11-20 MED ORDER — HYDROMORPHONE 1 MG/ML IV SOLN
INTRAVENOUS | Status: DC
  Administered 2018-11-21: 2.1 mg via INTRAVENOUS
  Administered 2018-11-21: 30 mg via INTRAVENOUS
  Administered 2018-11-21: 1.2 mg via INTRAVENOUS
  Administered 2018-11-21: 2.3 mg via INTRAVENOUS
  Administered 2018-11-21 – 2018-11-22 (×2): 2.4 mg via INTRAVENOUS
  Administered 2018-11-22: 2.5 mg via INTRAVENOUS
  Administered 2018-11-22: 5.4 mg via INTRAVENOUS
  Filled 2018-11-20: qty 30

## 2018-11-20 MED ORDER — HYDROMORPHONE HCL 2 MG/ML IJ SOLN
2.0000 mg | INTRAMUSCULAR | Status: AC
  Administered 2018-11-20: 2 mg via INTRAVENOUS
  Filled 2018-11-20: qty 1

## 2018-11-20 MED ORDER — HYDROMORPHONE HCL 2 MG/ML IJ SOLN: 2.0000 mg | INTRAMUSCULAR | Status: AC

## 2018-11-20 MED ORDER — ENOXAPARIN SODIUM 40 MG/0.4ML ~~LOC~~ SOLN
40.0000 mg | Freq: Every day | SUBCUTANEOUS | Status: DC
  Filled 2018-11-20: qty 0.4

## 2018-11-20 MED ORDER — DIPHENHYDRAMINE HCL 12.5 MG/5ML PO ELIX: 12.5000 mg | ORAL_SOLUTION | Freq: Four times a day (QID) | ORAL | Status: DC | PRN

## 2018-11-20 MED ORDER — HYDROMORPHONE HCL 1 MG/ML IJ SOLN
2.0000 mg | INTRAMUSCULAR | Status: DC | PRN
  Administered 2018-11-20: 2 mg via INTRAVENOUS
  Filled 2018-11-20: qty 2

## 2018-11-20 MED ORDER — VITAMIN D 25 MCG (1000 UNIT) PO TABS
2000.0000 [IU] | ORAL_TABLET | Freq: Every day | ORAL | Status: DC
  Administered 2018-11-21 – 2018-11-22 (×2): 2000 [IU] via ORAL
  Filled 2018-11-20 (×2): qty 2

## 2018-11-20 MED ORDER — SODIUM CHLORIDE 0.9 % IV SOLN: 10.0000 mL/h | Freq: Once | INTRAVENOUS | Status: DC

## 2018-11-20 MED ORDER — VITAMIN B-12 1000 MCG PO TABS
2000.0000 ug | ORAL_TABLET | Freq: Every day | ORAL | Status: DC
  Administered 2018-11-21 – 2018-11-22 (×2): 2000 ug via ORAL
  Filled 2018-11-20 (×2): qty 2

## 2018-11-20 MED ORDER — HYDROMORPHONE HCL 2 MG/ML IJ SOLN: 2.0000 mg | INTRAMUSCULAR | Status: DC

## 2018-11-20 MED ORDER — DIPHENHYDRAMINE HCL 50 MG/ML IJ SOLN
12.5000 mg | Freq: Four times a day (QID) | INTRAMUSCULAR | Status: DC | PRN
  Administered 2018-11-21: 12.5 mg via INTRAVENOUS
  Filled 2018-11-20: qty 1

## 2018-11-20 MED ORDER — SODIUM CHLORIDE 0.45 % IV SOLN
INTRAVENOUS | Status: DC
  Administered 2018-11-21 – 2018-11-22 (×3): via INTRAVENOUS

## 2018-11-20 MED ORDER — FOLIC ACID 1 MG PO TABS
1.0000 mg | ORAL_TABLET | Freq: Every day | ORAL | Status: DC
  Administered 2018-11-21 – 2018-11-22 (×2): 1 mg via ORAL
  Filled 2018-11-20 (×2): qty 1

## 2018-11-20 MED ORDER — HYDROMORPHONE HCL 2 MG/ML IJ SOLN: 2.0000 mg | INTRAMUSCULAR | Status: DC | PRN

## 2018-11-20 MED ORDER — ONDANSETRON HCL 4 MG/2ML IJ SOLN: 4.0000 mg | Freq: Four times a day (QID) | INTRAMUSCULAR | Status: DC | PRN

## 2018-11-20 MED ORDER — NALOXONE HCL 0.4 MG/ML IJ SOLN: 0.4000 mg | INTRAMUSCULAR | Status: DC | PRN

## 2018-11-20 MED ORDER — HYDROXYUREA 500 MG PO CAPS
1500.0000 mg | ORAL_CAPSULE | Freq: Every day | ORAL | Status: DC
  Administered 2018-11-21: 1500 mg via ORAL
  Filled 2018-11-20: qty 3

## 2018-11-20 MED ORDER — SODIUM CHLORIDE 0.9% FLUSH: 9.0000 mL | INTRAVENOUS | Status: DC | PRN

## 2018-11-20 NOTE — ED Notes (Signed)
Patient requesting IV team. Order placed per patient request.

## 2018-11-20 NOTE — ED Notes (Signed)
ED Provider at bedside. 
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
## 2018-11-20 NOTE — ED Triage Notes (Signed)
Pt c/o sickle cell pains esp in chest that started last night. Also c/o bilat feet swelling.

## 2018-11-20 NOTE — ED Notes (Addendum)
[place] RN attempting [place] IV at this time.

## 2018-11-20 NOTE — H&P (Addendum)
History and Physical    Ransom A [employer]. ID:[id number] DOB: 08-13-1980 DOA: 11/20/2018  PCP: [name], MD Patient coming from: Home  Chief Complaint: Sickle cell pain  HPI: [name] [name]. is a 38 y.o. male with medical history significant of sickle cell anemia, chronic pain syndrome, opiate dependence, history of cocaine use, nephropathy, immune complex glomerulonephritis, CKD stage III, GERD, migraines, history of right iliacus abscess which required percutaneous drainage in August 2020 presenting to the ED with complaints of sickle cell pain.  Patient states he is having pain in both of his legs and feet which is similar to his prior sickle cell crisis pain.  Unclear how many days he has had this pain.  States his left foot has been swollen which has happened in the past sometimes with his sickle cell crisis.  Denies any injury to the foot.  Denies history of gout.  Since last night he is also having shooting substernal chest pain only when he tries to cough.  His cough is nonproductive.  No chest pain at present.  Denies fevers.  No other complaints.  ED Course: Slightly tachycardic.  Not hypoxic.  Afebrile and no leukocytosis.  Hemoglobin 6.6.  Hemoglobin was 11.1 on 9/22, however, baseline is in the 7-8 range.  BUN 15, creatinine 2.5.  On recent labs, creatinine has been ranging between 1.9-2.3.  SARS-CoV-2 test pending.  Transaminases mildly elevated which is new compared to prior labs.  Alkaline phosphatase is chronically elevated.  T bili normal.  Chest x-ray showing no active cardiopulmonary disease.  EKG not suggestive of ACS. Patient received a total of 6 mg Dilaudid but continued to complain of pain.  Admission requested for management of sickle cell pain crisis.  Review of Systems:  All systems reviewed and apart from history of presenting illness, are negative.  Past Medical History:  Diagnosis Date  . Abscess of right iliac fossa 09/24/2018   required Perc  Drain 09/24/2018  . Arachnoid Cyst of brain bilaterally    "2 really small ones in the back of my head; inside; saw them w/MRI" (09/25/2012)  . Bacterial pneumonia ~ 2012   "caught it here in the hospital" (09/25/2012)  . Chronic kidney disease    "from my sickle cell" (09/25/2012)  . CKD (chronic kidney disease), stage II   . Colitis 04/19/2017   CT scan abd/ pelvis  . GERD (gastroesophageal reflux disease)    "after I eat alot of spicey foods" (09/25/2012)  . Gynecomastia, male 07/10/2012  . History of blood transfusion    "always related to sickle cell crisis" (09/25/2012)  . 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   Noted in noted from [facility] at [facility]  . Migraines    "take RX qd to prevent them" (09/25/2012)  . Opioid dependence with withdrawal ([place]) 08/30/2012  . Sickle cell anemia (HCC)   . Sickle cell crisis ([place]) 09/25/2012  . Sickle cell nephropathy ([place]) 07/10/2012  . Sinus tachycardia   . Tachycardia with heart rate 121-140 beats per minute with ambulation 08/04/2016    Past Surgical History:  Procedure Laterality Date  . ABCESS DRAINAGE    . CHOLECYSTECTOMY  ~ 2012  . COLONOSCOPY N/A 04/23/2017   Procedure: COLONOSCOPY;  Surgeon: Irene Shipper, MD;  Location: [facility];  Service: Endoscopy;  Laterality: N/A;  . IR FLUORO GUIDE CV LINE RIGHT  12/17/2016  . IR RADIOLOGIST EVAL & MGMT  10/02/2018  . IR RADIOLOGIST EVAL & MGMT  10/15/2018  .  IR REMOVAL TUN CV CATH W/O FL  12/21/2016  . IR US GUIDE VASC ACCESS RIGHT  12/17/2016  . spleenectomy       reports that he has been smoking cigarettes. He has been smoking about 0.10 packs per day. He has never used smokeless tobacco. He reports current alcohol use. He reports previous drug use. Drug: Marijuana.  Allergies  Allergen Reactions  . Nsaids Other (See Comments)    Kidney disease  . Morphine And Related Other (See Comments)    "Real bad headaches"     Family History  Problem Relation Age of Onset   . Breast cancer Mother     Prior to Admission medications   Medication Sig Start Date End Date Taking? Authorizing Provider  cholecalciferol (VITAMIN D3) 25 MCG (1000 UT) tablet Take 2,000 Units by mouth daily.   Yes [provider]  [facility] VITAMIN B12 1000 MCG tablet Take 2,000 mcg by mouth daily. 06/21/18  Yes [provider]  folic acid (FOLVITE) 1 MG tablet Take 1 tablet (1 mg total) daily by mouth. 12/22/16  Yes Leana Gamer, MD  hydroxyurea (HYDREA) 500 MG capsule TAKE 3 CAPSULES BY MOUTH DAILY Patient taking differently: Take 1,500 mg by mouth at bedtime.  06/19/18  Yes Lanae Boast, FNP  nortriptyline (PAMELOR) 25 MG capsule Take 1 capsule (25 mg total) by mouth at bedtime as needed for sleep. 06/10/18  Yes Donnamae Jude, MD  oxyCODONE (OXYCONTIN) 15 mg 12 hr tablet Take 1 tablet (15 mg total) by mouth every 12 (twelve) hours. 11/05/18  Yes [name], FNP  Oxycodone HCl 10 MG TABS Take 1 tablet (10 mg total) by mouth every 6 (six) hours as needed. 11/05/18  Yes [name], FNP  sodium bicarbonate 650 MG tablet Take 650 mg by mouth 2 (two) times daily. 11/07/18  Yes [provider]  tacrolimus (PROGRAF) 1 MG capsule Take 2 mg by mouth 2 (two) times daily. 11/12/18  Yes [provider]    Physical Exam: Vitals:   11/20/18 1930 11/20/18 2030 11/20/18 2052 11/20/18 2130  BP: 104/78 107/70  104/69  Pulse: (!) 101 (!) 111  94  Resp: 18 (!) 26  18  Temp:      TempSrc:      SpO2: 99% 99%    Weight:   56.7 kg     Physical Exam  Constitutional: He is oriented to person, place, and time. He appears well-developed and well-nourished. No distress.  HENT:  Head: Normocephalic.  Mouth/Throat: Oropharynx is clear and moist.  Eyes: Right eye exhibits no discharge. Left eye exhibits no discharge.  Neck: Neck supple.  Cardiovascular: Normal rate, regular rhythm and intact distal pulses.  Pulmonary/Chest: Effort normal and breath sounds  normal. No respiratory distress. He has no wheezes. He has no rales.  Abdominal: Soft. Bowel sounds are normal. He exhibits no distension. There is no abdominal tenderness. There is no guarding.  Musculoskeletal:     Comments: Dorsalis pedis pulse intact bilaterally Dorsum of left foot appears edematous.  No erythema or increased warmth to touch.  Neurological: He is alert and oriented to person, place, and time.  Skin: Skin is warm and dry. He is not diaphoretic.     Labs on Admission: I have personally reviewed following labs and imaging studies  CBC: Recent Labs  Lab 11/20/18 1845  WBC 8.1  NEUTROABS 4.1  HGB 6.6*  HCT 19.9*  MCV 125.2*  PLT 447*   Basic  Metabolic Panel: Recent Labs  Lab 11/20/18 1845  NA 140  K 3.9  CL 102  CO2 27  GLUCOSE 92  BUN 15  CREATININE 2.59*  CALCIUM 8.6*   GFR: Estimated Creatinine Clearance: 31.9 mL/min (A) (by C-G formula based on SCr of 2.59 mg/dL (H)). Liver Function Tests: Recent Labs  Lab 11/20/18 1845  AST 57*  ALT 75*  ALKPHOS 192*  BILITOT 1.2  PROT 6.0*  ALBUMIN 3.1*   No results for input(s): LIPASE, AMYLASE in the last 168 hours. No results for input(s): AMMONIA in the last 168 hours. Coagulation Profile: No results for input(s): INR, PROTIME in the last 168 hours. Cardiac Enzymes: No results for input(s): CKTOTAL, CKMB, CKMBINDEX, TROPONINI in the last 168 hours. BNP (last 3 results) No results for input(s): PROBNP in the last 8760 hours. HbA1C: No results for input(s): HGBA1C in the last 72 hours. CBG: No results for input(s): GLUCAP in the last 168 hours. Lipid Profile: No results for input(s): CHOL, HDL, LDLCALC, TRIG, CHOLHDL, LDLDIRECT in the last 72 hours. Thyroid Function Tests: No results for input(s): TSH, T4TOTAL, FREET4, T3FREE, THYROIDAB in the last 72 hours. Anemia Panel: Recent Labs    11/20/18 1845  RETICCTPCT 7.1*   Urine analysis:    Component Value Date/Time   COLORURINE STRAW (A)  09/23/2018 1522   APPEARANCEUR HAZY (A) 09/23/2018 1522   LABSPEC 1.009 09/23/2018 1522   PHURINE 7.0 09/23/2018 1522   GLUCOSEU 50 (A) 09/23/2018 1522   HGBUR NEGATIVE 09/23/2018 1522   HGBUR small 03/10/2010 1445   BILIRUBINUR neg 10/01/2018 1412   KETONESUR NEGATIVE 09/23/2018 1522   PROTEINUR Positive (A) 10/01/2018 1412   PROTEINUR >=300 (A) 09/23/2018 1522   UROBILINOGEN 0.2 10/01/2018 1412   UROBILINOGEN 0.2 05/11/2017 1335   NITRITE neg 10/01/2018 1412   NITRITE NEGATIVE 09/23/2018 1522   LEUKOCYTESUR Negative 10/01/2018 1412   LEUKOCYTESUR SMALL (A) 09/23/2018 1522    Radiological Exams on Admission: Dg Chest 2 View  Result Date: 11/20/2018 CLINICAL DATA:  Chest pain which shortness-of-breath and dry cough since yesterday. EXAM: CHEST - 2 VIEW COMPARISON:  07/22/2018 and 10/16/2016 FINDINGS: Lungs are adequately inflated with chronic stable hazy prominence of the perihilar vessels. No lobar consolidation or effusion. Small nodule opacity over the lateral left base unchanged. Subtle nodular opacity over the anterior lung bases on the lateral film not well localized on the frontal film. Flattening of the hemidiaphragms on the lateral film. Cardiomediastinal silhouette and remainder of the exam is unchanged. IMPRESSION: No acute cardiopulmonary disease. Subtle nodular density over the anterior lung bases on the lateral film. Recommend follow-up chest radiograph 3-4 weeks versus noncontrast chest CT for further evaluation. Electronically Signed   By: Marin Olp M.D.   On: 11/20/2018 12:58    EKG: Independently reviewed.  Sinus tachycardia, heart rate 101.  Baseline wander.  Assessment/Plan Principal Problem:   Sickle cell pain crisis ([place]) Active Problems:   AKI (acute kidney injury) ([place])   Anemia   Transaminitis   Pulmonary nodule   Sickle cell pain crisis, acute on chronic anemia Patient is complaining of pain in both of his legs and feet.  He also has edema of his  left foot which he states he has had before with a sickle cell pain crisis.  Denies any injury to the foot.  Denies history of gout.  He is also complaining of shooting substernal chest pain only when he tries to cough.  No chest pain at present.  No consolidation/pulmonary infiltrate on chest x-ray or fever to suggest acute chest syndrome.  Not hypoxic.  EKG not suggestive of ACS. Hemoglobin 6.6.  Hemoglobin was 11.1 on 9/22 (likely aberrant). Baseline hemoglobin is in the 7-8 range. -Cardiac monitoring -Check high-sensitivity troponin level -0.45% normal saline at 125 cc/h -Patient continues to complain of pain despite receiving a total of 6 mg Dilaudid in the ED.  Start Dilaudid PCA. -Continue folic acid 1 mg daily -Continue hydroxyurea -Continuous pulse ox -Avoid Toradol given AKI on CKD stage III -Type and screen -1 unit PRBCs -H&H posttransfusion -Left foot x-ray -Check uric acid level  AKI on CKD stage III BUN 15, creatinine 2.5.  On recent labs, creatinine has been ranging between 1.9-2.3. -IV fluid hydration -Continue to monitor renal function -Monitor urine output  Mild transaminitis Likely related to sickle cell crisis.  Alkaline phosphatase is chronically elevated.  T bili normal. -Continue to monitor LFTs  Pulmonary nodule Chest x-ray showing subtle nodular density over the anterior lung bases on the lateral film. -PCA follow-up for repeat chest x-ray in 3 to 4 weeks versus noncontrast chest CT.  DVT prophylaxis: Lovenox Code Status: Full code Family Communication: No family available. Disposition Plan: Anticipate discharge after clinical improvement. Consults called: None Admission status: It is my clinical opinion that referral for OBSERVATION is reasonable and necessary in this patient based on the above information provided. The aforementioned taken together are felt to place the patient at high risk for further clinical deterioration. However it is anticipated that  the patient may be medically stable for discharge from the hospital within 24 to 48 hours.  The medical decision making on this patient was of high complexity and the patient is at high risk for clinical deterioration, therefore this is a level 3 visit.  Shela Leff MD [facility] Pager [phone]  If 7PM-7AM, please contact night-coverage [email or web address] Password [facility]  11/20/2018, 9:39 PM

## 2018-11-20 NOTE — ED Notes (Signed)
CRITICAL VALUE STICKER  CRITICAL VALUE: Hemoglobin of 6.6  RECEIVER (on-site recipient of call): Burley Saver, RN  [place] NOTIFIED: 11/20/18 @ 19:26  MESSENGER (representative from lab): Rosalene Billings  MD NOTIFIED: Dr. Eulis Foster  TIME OF NOTIFICATION: 19:28  RESPONSE: New orders for a Type and screen

## 2018-11-21 ENCOUNTER — Encounter (HOSPITAL_COMMUNITY): Payer: Self-pay | Admitting: Family Medicine

## 2018-11-21 DIAGNOSIS — G894 Chronic pain syndrome: Secondary | ICD-10-CM | POA: Diagnosis present

## 2018-11-21 DIAGNOSIS — D649 Anemia, unspecified: Secondary | ICD-10-CM

## 2018-11-21 DIAGNOSIS — N179 Acute kidney failure, unspecified: Secondary | ICD-10-CM | POA: Diagnosis present

## 2018-11-21 DIAGNOSIS — D631 Anemia in chronic kidney disease: Secondary | ICD-10-CM | POA: Diagnosis present

## 2018-11-21 DIAGNOSIS — M79673 Pain in unspecified foot: Secondary | ICD-10-CM | POA: Diagnosis present

## 2018-11-21 DIAGNOSIS — Z803 Family history of malignant neoplasm of breast: Secondary | ICD-10-CM | POA: Diagnosis not present

## 2018-11-21 DIAGNOSIS — Z79899 Other long term (current) drug therapy: Secondary | ICD-10-CM | POA: Diagnosis not present

## 2018-11-21 DIAGNOSIS — N289 Disorder of kidney and ureter, unspecified: Secondary | ICD-10-CM | POA: Diagnosis not present

## 2018-11-21 DIAGNOSIS — D5700 Hb-SS disease with crisis, unspecified: Principal | ICD-10-CM

## 2018-11-21 DIAGNOSIS — N08 Glomerular disorders in diseases classified elsewhere: Secondary | ICD-10-CM | POA: Diagnosis present

## 2018-11-21 DIAGNOSIS — R7401 Elevation of levels of liver transaminase levels: Secondary | ICD-10-CM | POA: Diagnosis present

## 2018-11-21 DIAGNOSIS — R911 Solitary pulmonary nodule: Secondary | ICD-10-CM | POA: Diagnosis present

## 2018-11-21 DIAGNOSIS — M19072 Primary osteoarthritis, left ankle and foot: Secondary | ICD-10-CM | POA: Diagnosis present

## 2018-11-21 DIAGNOSIS — Z20828 Contact with and (suspected) exposure to other viral communicable diseases: Secondary | ICD-10-CM | POA: Diagnosis present

## 2018-11-21 DIAGNOSIS — R2242 Localized swelling, mass and lump, left lower limb: Secondary | ICD-10-CM

## 2018-11-21 DIAGNOSIS — N184 Chronic kidney disease, stage 4 (severe): Secondary | ICD-10-CM | POA: Diagnosis present

## 2018-11-21 DIAGNOSIS — Z79891 Long term (current) use of opiate analgesic: Secondary | ICD-10-CM | POA: Diagnosis not present

## 2018-11-21 LAB — BASIC METABOLIC PANEL
Anion gap: 13 (ref 5–15)
BUN: 14 mg/dL (ref 6–20)
CO2: 24 mmol/L (ref 22–32)
Calcium: 8.6 mg/dL — ABNORMAL LOW (ref 8.9–10.3)
Chloride: 102 mmol/L (ref 98–111)
Creatinine, Ser: 2.45 mg/dL — ABNORMAL HIGH (ref 0.61–1.24)
GFR calc Af Amer: 38 mL/min — ABNORMAL LOW (ref >60)
GFR calc non Af Amer: 33 mL/min — ABNORMAL LOW (ref >60)
Glucose, Bld: 92 mg/dL (ref 70–99)
Potassium: 4 mmol/L (ref 3.5–5.1)
Sodium: 139 mmol/L (ref 135–145)

## 2018-11-21 LAB — RAPID URINE DRUG SCREEN, HOSP PERFORMED
Amphetamines: NOT DETECTED
Barbiturates: NOT DETECTED
Benzodiazepines: NOT DETECTED
Cocaine: NOT DETECTED
Opiates: POSITIVE — AB
Tetrahydrocannabinol: POSITIVE — AB

## 2018-11-21 LAB — BPAM RBC
Blood Product Expiration Date: 202010312359
ISSUE DATE / TIME: 202010072241
Unit Type and Rh: 5100

## 2018-11-21 LAB — HEMOGLOBIN AND HEMATOCRIT, BLOOD
HCT: 24.2 % — ABNORMAL LOW (ref 39.0–52.0)
Hemoglobin: 7.8 g/dL — ABNORMAL LOW (ref 13.0–17.0)

## 2018-11-21 LAB — TYPE AND SCREEN
ABO/RH(D): O POS
Antibody Screen: NEGATIVE
Donor AG Type: NEGATIVE
Unit division: 0

## 2018-11-21 LAB — SARS CORONAVIRUS 2 (TAT 6-24 HRS): SARS Coronavirus 2: NEGATIVE

## 2018-11-21 MED ORDER — OXYCODONE HCL ER 15 MG PO T12A
15.0000 mg | EXTENDED_RELEASE_TABLET | Freq: Two times a day (BID) | ORAL | Status: DC
  Administered 2018-11-21 – 2018-11-22 (×2): 15 mg via ORAL
  Filled 2018-11-21 (×2): qty 1

## 2018-11-21 NOTE — Progress Notes (Signed)
Subjective: [name] [name], a 38 year old male with a medical history significant for sickle cell disease, type SS, chronic kidney disease stage IV, opiate dependence, opiate tolerance, history of migraine headaches, history of GERD, history of cocaine use, and history of anemia of chronic disease was admitted for sickle cell pain crisis.  [name] says that pain is primarily to left foot which is consistent with previous sickle cell crisis.  Patient states that his left foot has been swollen and painful over the past 4 days.  Pain is worsened with weightbearing.  Pain intensity at this time is 8/10.  He characterizes pain as constant and throbbing.  Patient's hemoglobin was 6.6 on admission, he was transfused 1 unit of packed red blood cells.  Hemoglobin has returned to 7.8, which is consistent with his baseline.  Patient denies headache, shortness of breath, chest pain, dizziness, paresthesias, dysuria, nausea, vomiting, or diarrhea.  Objective:  Vital signs in last 24 hours:  Vitals:   11/21/18 0528 11/21/18 0805 11/21/18 1030 11/21/18 1244  BP: 112/86  112/82   Pulse: (!) 104  82   Resp: 18 18 18 16   Temp: 99.1 F (37.3 C)  99 F (37.2 C)   TempSrc: Oral  Oral   SpO2: 98% 95% 95% 94%  Weight:      Height:        Intake/Output from previous day:   Intake/Output Summary (Last 24 hours) at 11/21/2018 1249 Last data filed at 11/21/2018 1116 Gross per 24 hour  Intake 1227 ml  Output 1100 ml  Net 127 ml   Physical Exam Constitutional:      General: He is in acute distress.     Appearance: He is not ill-appearing.  HENT:     Head: Normocephalic.     Nose: Nose normal.  Eyes:     General: No scleral icterus.    Pupils: Pupils are equal, round, and reactive to light.  Cardiovascular:     Rate and Rhythm: Normal rate and regular rhythm.     Pulses: Normal pulses.  Pulmonary:     Effort: Pulmonary effort is normal.     Breath sounds: Normal breath sounds.  Abdominal:      General: Abdomen is flat. Bowel sounds are normal.  Musculoskeletal: Normal range of motion.     Left ankle: He exhibits swelling. He exhibits no ecchymosis and normal pulse.  Skin:    General: Skin is warm.  Neurological:     General: No focal deficit present.     Mental Status: He is alert. Mental status is at baseline.  Psychiatric:        Mood and Affect: Mood normal.        Behavior: Behavior normal.        Thought Content: Thought content normal.        Judgment: Judgment normal.     Lab Results:  Basic Metabolic Panel:    Component Value Date/Time   NA 139 11/21/2018 0355   NA 142 10/01/2018 1430   K 4.0 11/21/2018 0355   CL 102 11/21/2018 0355   CO2 24 11/21/2018 0355   BUN 14 11/21/2018 0355   BUN 42 (H) 10/01/2018 1430   CREATININE 2.45 (H) 11/21/2018 0355   CREATININE 1.45 (H) 05/12/2014 1430   GLUCOSE 92 11/21/2018 0355   CALCIUM 8.6 (L) 11/21/2018 0355   CBC:    Component Value Date/Time   WBC 8.1 11/20/2018 1845   HGB 7.8 (L) 11/21/2018 0355   HGB 7.4 (  L) 10/01/2018 1430   HCT 24.2 (L) 11/21/2018 0355   HCT 22.6 (L) 10/01/2018 1430   PLT 447 (H) 11/20/2018 1845   PLT 391 10/01/2018 1430   MCV 125.2 (H) 11/20/2018 1845   MCV 102 (H) 10/01/2018 1430   NEUTROABS 4.1 11/20/2018 1845   NEUTROABS 13.1 (H) 10/01/2018 1430   LYMPHSABS 3.4 11/20/2018 1845   LYMPHSABS 3.7 (H) 10/01/2018 1430   MONOABS 0.3 11/20/2018 1845   EOSABS 0.2 11/20/2018 1845   EOSABS 0.0 10/01/2018 1430   BASOSABS 0.0 11/20/2018 1845   BASOSABS 0.0 10/01/2018 1430    Recent Results (from the past 240 hour(s))  SARS CORONAVIRUS 2 (TAT 6-24 HRS) Nasopharyngeal Nasopharyngeal Swab     Status: None   Collection Time: 11/20/18  6:16 PM   Specimen: Nasopharyngeal Swab  Result Value Ref Range Status   SARS Coronavirus 2 NEGATIVE NEGATIVE Final    Comment: (NOTE) SARS-CoV-2 target nucleic acids are NOT DETECTED. The SARS-CoV-2 RNA is generally detectable in upper and  lower respiratory specimens during the acute phase of infection. Negative results do not preclude SARS-CoV-2 infection, do not rule out co-infections with other pathogens, and should not be used as the sole basis for treatment or other patient management decisions. Negative results must be combined with clinical observations, patient history, and epidemiological information. The expected result is Negative. Fact Sheet for Patients: SugarRoll.be Fact Sheet for Healthcare Providers: https://www.woods-mathews.com/ This test is not yet approved or cleared by the [place] FDA and  has been authorized for detection and/or diagnosis of SARS-CoV-2 by FDA under an Emergency Use Authorization (EUA). This EUA will remain  in effect (meaning this test can be used) for the duration of the COVID-19 declaration under Section 56 4(b)(1) of the Act, 21 U.S.C. section 360bbb-3(b)(1), unless the authorization is terminated or revoked sooner. Performed at [facility], [place] [address], [place], [place] [address]     Studies/Results: Dg Chest 2 View  Result Date: 11/20/2018 CLINICAL DATA:  Chest pain which shortness-of-breath and dry cough since yesterday. EXAM: CHEST - 2 VIEW COMPARISON:  07/22/2018 and 10/16/2016 FINDINGS: Lungs are adequately inflated with chronic stable hazy prominence of the perihilar vessels. No lobar consolidation or effusion. Small nodule opacity over the lateral left base unchanged. Subtle nodular opacity over the anterior lung bases on the lateral film not well localized on the frontal film. Flattening of the hemidiaphragms on the lateral film. Cardiomediastinal silhouette and remainder of the exam is unchanged. IMPRESSION: No acute cardiopulmonary disease. Subtle nodular density over the anterior lung bases on the lateral film. Recommend follow-up chest radiograph 3-4 weeks versus noncontrast chest CT for further evaluation.  Electronically Signed   By: Marin Olp M.D.   On: 11/20/2018 12:58   Dg Foot 2 Views Left  Result Date: 11/20/2018 CLINICAL DATA:  Possible sickle cell crisis EXAM: LEFT FOOT - 2 VIEW COMPARISON:  11/02/2018 FINDINGS: Increased sclerosis is again noted in the first metatarsal with arthritic changes of the first MTP joint. No acute fracture or dislocation is noted. No soft tissue abnormality is seen. IMPRESSION: Chronic changes in the first metatarsal stable from the prior exam. No acute abnormality noted. Electronically Signed   By: Inez Catalina M.D.   On: 11/20/2018 21:46    Medications: Scheduled Meds: . cholecalciferol  2,000 Units Oral Daily  . enoxaparin (LOVENOX) injection  40 mg Subcutaneous QHS  . folic acid  1 mg Oral Daily  . HYDROmorphone   Intravenous Q4H  .  hydroxyurea  1,500 mg Oral QHS  . cyanocobalamin  2,000 mcg Oral Daily   Continuous Infusions: . sodium chloride 125 mL/hr at 11/21/18 1116   PRN Meds:.diphenhydrAMINE **OR** diphenhydrAMINE, naloxone **AND** sodium chloride flush, ondansetron (ZOFRAN) IV  Consultants:  None  Procedures:  None  Antibiotics:  None  Assessment/Plan: Principal Problem:   Sickle cell pain crisis ([place]) Active Problems:   AKI (acute kidney injury) ([place])   Anemia   Transaminitis   Pulmonary nodule  Sickle cell disease with pain crisis: Continue IV Dilaudid via PCA.  No change in settings on today being that patient's pain intensity remains elevated. Decrease IV fluids, 50 mL/h Maintain oxygen saturation above 90%, 2 L supplemental oxygen as needed Reevaluate pain scale regularly in the context of functioning and relationship to baseline as his care progresses.  AKI on CKD stage IV: Creatinine 2.5.  Patient's baseline is between 2.0-2.3. Continue gentle hydration Monitor renal functioning Monitor urine output.  Chronic pain syndrome: Restart patient's OxyContin 15 mg every 12 hours Hold oxycodone, use PCA Dilaudid as  substitute  Sickle cell anemia: Hemoglobin 7.7, consistent with patient's baseline.  Continue to monitor closely. CBC in a.m.   Code Status: Full Code Family Communication: N/A Disposition Plan: Not yet ready for discharge  [place], MSN, FNP-C Patient [place] [address] [place], [place] [address] [phone]  If 5PM-7AM, please contact night-coverage.  11/21/2018, 12:49 PM  LOS: 0 days    This note was prepared using Dragon speech recognition software, errors in dictation are unintentional.

## 2018-11-22 DIAGNOSIS — N179 Acute kidney failure, unspecified: Secondary | ICD-10-CM

## 2018-11-22 LAB — CBC
HCT: 23.7 % — ABNORMAL LOW (ref 39.0–52.0)
Hemoglobin: 7.8 g/dL — ABNORMAL LOW (ref 13.0–17.0)
MCH: 35.3 pg — ABNORMAL HIGH (ref 26.0–34.0)
MCHC: 32.9 g/dL (ref 30.0–36.0)
MCV: 107.2 fL — ABNORMAL HIGH (ref 80.0–100.0)
Platelets: 334 10*3/uL (ref 150–400)
RBC: 2.21 MIL/uL — ABNORMAL LOW (ref 4.22–5.81)
WBC: 6.2 10*3/uL (ref 4.0–10.5)
nRBC: 3.2 % — ABNORMAL HIGH (ref 0.0–0.2)

## 2018-11-22 LAB — COMPREHENSIVE METABOLIC PANEL
ALT: 66 U/L — ABNORMAL HIGH (ref 0–44)
AST: 68 U/L — ABNORMAL HIGH (ref 15–41)
Albumin: 2.7 g/dL — ABNORMAL LOW (ref 3.5–5.0)
Alkaline Phosphatase: 194 U/L — ABNORMAL HIGH (ref 38–126)
Anion gap: 13 (ref 5–15)
BUN: 13 mg/dL (ref 6–20)
CO2: 21 mmol/L — ABNORMAL LOW (ref 22–32)
Calcium: 8.6 mg/dL — ABNORMAL LOW (ref 8.9–10.3)
Chloride: 102 mmol/L (ref 98–111)
Creatinine, Ser: 2.27 mg/dL — ABNORMAL HIGH (ref 0.61–1.24)
GFR calc Af Amer: 42 mL/min — ABNORMAL LOW (ref >60)
GFR calc non Af Amer: 36 mL/min — ABNORMAL LOW (ref >60)
Glucose, Bld: 83 mg/dL (ref 70–99)
Potassium: 4.3 mmol/L (ref 3.5–5.1)
Sodium: 136 mmol/L (ref 135–145)
Total Bilirubin: 1.1 mg/dL (ref 0.3–1.2)
Total Protein: 5.4 g/dL — ABNORMAL LOW (ref 6.5–8.1)

## 2018-11-22 MED ORDER — HYDROMORPHONE HCL 1 MG/ML IJ SOLN
2.0000 mg | Freq: Once | INTRAMUSCULAR | Status: AC
  Administered 2018-11-22: 2 mg via SUBCUTANEOUS
  Filled 2018-11-22: qty 2

## 2018-11-22 MED ORDER — OXYCODONE HCL 5 MG PO TABS
10.0000 mg | ORAL_TABLET | ORAL | Status: DC
  Administered 2018-11-22: 10 mg via ORAL
  Filled 2018-11-22: qty 2

## 2018-11-22 MED ORDER — OXYCODONE HCL 10 MG PO TABS: 10.0000 mg | ORAL_TABLET | Freq: Four times a day (QID) | ORAL | 0 refills | Status: DC | PRN

## 2018-11-22 NOTE — Discharge Instructions (Signed)
I will resend a referral to orthopedic specialist for ongoing left foot pain.  Please schedule a first available appointment with me in primary care.  You may benefit from port a cath placement going forward. Please review information.   Implanted Port Insertion Implanted port insertion is a procedure to put in a port and catheter. The port is a device with an injectable disk that can be accessed by your health care provider. The port is connected to a vein in the chest or neck by a small flexible tube (catheter). There are different types of ports. The implanted port may be used as a long-term IV access for:  Medicines, such as chemotherapy.  Fluids.  Liquid nutrition, such as total parenteral nutrition (TPN). When you have a port, this means that your health care provider will not need to use the veins in your arms for these procedures. Tell a health care provider about:  Any allergies you have.  All medicines you are taking, especially blood thinners, as well as any vitamins, herbs, eye drops, creams, over-the-counter medicines, and steroids.  Any problems you or family members have had with anesthetic medicines.  Any blood disorders you have.  Any surgeries you have had.  Any medical conditions you have or have had, including diabetes or kidney problems.  Whether you are pregnant or may be pregnant. What are the risks? Generally, this is a safe procedure. However, problems may occur, including:  Allergic reactions to medicines or dyes.  Damage to other structures or organs.  Infection.  Damage to the blood vessel, bruising, or bleeding at the puncture site.  Blood clot.  Breakdown of the skin over the port.  A collection of air in the chest that can cause one of the lungs to collapse (pneumothorax). This is rare. What happens before the procedure? Medicines  Ask your health care provider about: ? Changing or stopping your regular medicines. This is especially  important if you are taking diabetes medicines or blood thinners. ? Taking medicines such as aspirin and ibuprofen. These medicines can thin your blood. Do not take these medicines unless your health care provider tells you to take them. ? Taking over-the-counter medicines, vitamins, herbs, and supplements. Staying hydrated Follow instructions from your health care provider about hydration, which may include:  Up to 2 hours before the procedure - you may continue to drink clear liquids, such as water, clear fruit juice, black coffee, and plain tea.  Eating and drinking restrictions  Follow instructions from your health care provider about eating and drinking, which may include: ? 8 hours before the procedure - stop eating heavy meals or foods, such as meat, fried foods, or fatty foods. ? 6 hours before the procedure - stop eating light meals or foods, such as toast or cereal. ? 6 hours before the procedure - stop drinking milk or drinks that contain milk. ? 2 hours before the procedure - stop drinking clear liquids. General instructions  Plan to have someone take you home from the hospital or clinic.  If you will be going home right after the procedure, plan to have someone with you for 24 hours.  You may have blood tests.  Do not use any products that contain nicotine or tobacco for at least 4-6 weeks before the procedure. These products include cigarettes, e-cigarettes, and chewing tobacco. If you need help quitting, ask your health care provider.  Ask your health care provider what steps will be taken to help prevent infection. These may  include: ? Removing hair at the surgery site. ? Washing skin with a germ-killing soap. ? Taking antibiotic medicine. What happens during the procedure?   An IV will be inserted into one of your veins.  You will be given one or more of the following: ? A medicine to help you relax (sedative). ? A medicine to numb the area (local  anesthetic).  Two small incisions will be made to insert the port. ? One smaller incision will be made in your neck to get access to the vein where the catheter will lie. ? The other incision will be made in the upper chest. This is where the port will lie.  The procedure may be done using continuous X-ray (fluoroscopy) or other imaging tools for guidance.  The port and catheter will be placed. There may be a small, raised area where the port is.  The port will be flushed with a salt solution (saline), and blood will be drawn to make sure that it is working correctly.  The incisions will be closed.  Bandages (dressings) may be placed over the incisions. The procedure may vary among health care providers and hospitals. What happens after the procedure?  Your blood pressure, heart rate, breathing rate, and blood oxygen level will be monitored until you leave the hospital or clinic.  Do not drive for 24 hours if you were given a sedative during your procedure.  You will be given a manufacturer's information card for the type of port that you have. Keep this with you.  Your port will need to be flushed and checked as told by your health care provider, usually every few weeks.  A chest X-ray will be done to: ? Check the placement of the port. ? Make sure there is no injury to your lung. Summary  Implanted port insertion is a procedure to put in a port and catheter.  The implanted port is used as a long-term IV access.  The port will need to be flushed and checked as told by your health care provider, usually every few weeks.  Keep your manufacturer's information card with you at all times. This information is not intended to replace advice given to you by your health care provider. Make sure you discuss any questions you have with your health care provider. Document Released: 11/20/2012 Document Revised: 05/24/2018 Document Reviewed: 08/28/2017 Elsevier Patient Education  [place].   Sickle Cell Anemia, Adult  Sickle cell anemia is a condition where your red blood cells are shaped like sickles. Red blood cells carry oxygen through the body. Sickle-shaped cells do not live as long as normal red blood cells. They also clump together and block blood from flowing through the blood vessels. This prevents the body from getting enough oxygen. Sickle cell anemia causes organ damage and pain. It also increases the risk of infection. Follow these instructions at home: Medicines  Take over-the-counter and prescription medicines only as told by your doctor.  If you were prescribed an antibiotic medicine, take it as told by your doctor. Do not stop taking the antibiotic even if you start to feel better.  If you develop a fever, do not take medicines to lower the fever right away. Tell your doctor about the fever. Managing pain, stiffness, and swelling  Try these methods to help with pain: ? Use a heating pad. ? Take a warm bath. ? Distract yourself, such as by watching TV. Eating and drinking  Drink enough fluid to keep your  pee (urine) clear or pale yellow. Drink more in hot weather and during exercise.  Limit or avoid alcohol.  Eat a healthy diet. Eat plenty of fruits, vegetables, whole grains, and lean protein.  Take vitamins and supplements as told by your doctor. Traveling  When traveling, keep these with you: ? Your medical information. ? The names of your doctors. ? Your medicines.  If you need to take an airplane, talk to your doctor first. Activity  Rest often.  Avoid exercises that make your heart beat much faster, such as jogging. General instructions  Do not use products that have nicotine or tobacco, such as cigarettes and e-cigarettes. If you need help quitting, ask your doctor.  Consider wearing a medical alert bracelet.  Avoid being in high places (high altitudes), such as mountains.  Avoid very hot or cold  temperatures.  Avoid places where the temperature changes a lot.  Keep all follow-up visits as told by your doctor. This is important. Contact a doctor if:  A joint hurts.  Your feet or hands hurt or swell.  You feel tired (fatigued). Get help right away if:  You have symptoms of infection. These include: ? Fever. ? Chills. ? Being very tired. ? Irritability. ? Poor eating. ? Throwing up (vomiting).  You feel dizzy or faint.  You have new stomach pain, especially on the left side.  You have a an erection (priapism) that lasts more than 4 hours.  You have numbness in your arms or legs.  You have a hard time moving your arms or legs.  You have trouble talking.  You have pain that does not go away when you take medicine.  You are short of breath.  You are breathing fast.  You have a long-term cough.  You have pain in your chest.  You have a bad headache.  You have a stiff neck.  Your stomach looks bloated even though you did not eat much.  Your skin is pale.  You suddenly cannot see well. Summary  Sickle cell anemia is a condition where your red blood cells are shaped like sickles.  Follow your doctor's advice on ways to manage pain, food to eat, activities to do, and steps to take for safe travel.  Get medical help right away if you have any signs of infection, such as a fever. This information is not intended to replace advice given to you by your health care provider. Make sure you discuss any questions you have with your health care provider. Document Released: 11/20/2012 Document Revised: 05/24/2018 Document Reviewed: 03/07/2016 Elsevier Patient Education  2020 [employer].

## 2018-11-22 NOTE — Progress Notes (Signed)
Pt verbally aggressive using foul language on nurse and nurse tech and swear words. Very combative and resistance to care. Charge nurse made aware and witness to such behavior.

## 2018-11-22 NOTE — Discharge Summary (Signed)
Physician Discharge Summary  [name] [name]. [id number] DOB: [birth year] DOA: 11/20/2018  PCP: [name], MD  Admit date: 11/20/2018  Discharge date: 11/22/2018  Discharge Diagnoses:  Principal Problem:   Sickle cell pain crisis ([place]) Active Problems:   AKI (acute kidney injury) ([place])   Anemia   Transaminitis   Pulmonary nodule   Renal insufficiency   Localized swelling of left foot   Discharge Condition: Stable  Disposition:  Pt is discharged home in good condition and is to follow up with [name], MD this week to have labs evaluated. [name] is instructed to increase activity slowly and balance with rest for the next few days, and use prescribed medication to complete treatment of pain  Diet: Regular Wt Readings from Last 3 Encounters:  11/20/18 55.7 kg  11/02/18 59.9 kg  10/01/18 59.9 kg    History of present illness:  [name] [name], a 38 year old male with a medical history significant for sickle cell anemia, chronic pain syndrome, opiate dependence, history of cocaine use, nephropathy, immune complex glomerulonephritis, CKD stage IV, GERD, migraines, history of right iliopsoas abscess, which required percutaneous drainage in August 2020 presented to the ER with complaints of sickle cell pain.  Patient states that he is having pain and bilateral lower extremities, which is similar to prior sickle cell crisis.  Patient is unclear on how many days he has had this pain.  He states the left foot has been swollen, which is happened in the past sometimes during sickle cell crisis.  Patient denies history of gout.  Since last night he has been having shooting substernal chest pain only when he tries to cough.  Cough is nonproductive.  No chest pain at present.  Denies fevers.  No other complaints.  ER course: Slightly tachycardic.  Not hypoxic.  Afebrile and no leukocytosis.  Hemoglobin 6.6.  Hemoglobin was 11.1 on 922, however  baseline is in the 7-8 range.  BUN 15, creatinine 2.5.  On recent labs, creatinine has been ranging from 1.9-2.3.  COVID-19 test pending.  Transaminases mildly elevated, which is new compared to prior labs.  Alkaline phosphatase is chronically elevated.  T bili normal.  Chest x-ray shows no acute cardiopulmonary process.  EKG is not suggestive of ACS.  Patient received a total of 6 mg of Dilaudid, but continues to complain of pain.  Patient admitted to telemetry for sickle cell pain crisis.  Hospital Course:  Sickle cell disease with pain crisis: Patient was admitted for sickle cell pain crisis and managed appropriately with IVF, IV Dilaudid via PCA and IV Toradol held due to history of stage IV chronic kidney disease, as well as other adjunct therapies per sickle cell pain management protocols.  PCA was discontinued abruptly due to poor venous access.  Patient had a total of 3 peripheral IVs during a 2-day inpatient admission.  Patient and I discussed possible Port-A-Cath placement going further.  Provided written information and will discuss further in primary care.  Patient advised to follow-up within 1 week, will repeat CBC and BMP at that time.  Patient will resume home medications.  Oxycodone 10 mg every 6 hours as needed for moderate to severe pain #60 was sent to patient's pharmacy.  PDMP reviewed prior to prescribing opiate medication, no inconsistencies noted.   Sickle cell anemia: CBC was 6.6 in ER.  Patient transfused 1 unit of PRBCs.  Hemoglobin 7.8 on today, which is consistent with patient's baseline.  Chronic pain syndrome: OxyContin  15 mg every 12 hours was continued without interruption.  Patient transition to oxycodone.  Stage IV chronic kidney disease: Patient has history of acute glomerulonephritis related to sickle cell disease.  He is followed by nephrology for this problem.  Creatinine decreased throughout admission, 2.2 prior to discharge.  Patient advised to follow-up with  nephrology as scheduled.  TJ has ongoing left foot pain.  Patient has a history of sclerosis and severe distal arthritic changes of the left first metatarsal.  He was referred to orthopedic specialist in the past, but has not scheduled appointment.  This provider will resend outpatient referral  Patient alert, oriented, and ambulating without assistance. Patient was discharged home today in a hemodynamically stable condition.   Discharge Exam: Vitals:   11/22/18 0847 11/22/18 1031  BP:  115/79  Pulse:  84  Resp: 11 10  Temp:  98.9 F (37.2 C)  SpO2: 100% 100%   Vitals:   11/22/18 0413 11/22/18 0642 11/22/18 0847 11/22/18 1031  BP:  106/65  115/79  Pulse:  91  84  Resp: 10 18 11 10   Temp:  98.5 F (36.9 C)  98.9 F (37.2 C)  TempSrc:  Oral  Oral  SpO2: 99% 93% 100% 100%  Weight:      Height:        General appearance : Awake, alert, not in any distress. Speech Clear. Not toxic looking HEENT: Atraumatic and Normocephalic, pupils equally reactive to light and accomodation Neck: Supple, no JVD. No cervical lymphadenopathy.  Chest: Good air entry bilaterally, no added sounds  CVS: S1 S2 regular, no murmurs.  Abdomen: Bowel sounds present, Non tender and not distended with no gaurding, rigidity or rebound. Extremities: Left foot shows mild edema, both legs are warm to touch Neurology: Awake alert, and oriented X 3, CN II-XII intact, Non focal Skin: No Rash  Discharge Instructions  Discharge Instructions    Discharge patient   Complete by: As directed    Discharge disposition: 01-Home or Self Care   Discharge patient date: 11/22/2018     Allergies as of 11/22/2018      Reactions   Nsaids Other (See Comments)   Kidney disease   Morphine And Related Other (See Comments)   "Real bad headaches"       Medication List    TAKE these medications   cholecalciferol 25 MCG (1000 UT) tablet Commonly known as: VITAMIN D3 Take 2,000 Units by mouth daily.   [employer] VITAMIN B12  1000 MCG tablet Generic drug: cyanocobalamin Take 2,000 mcg by mouth daily.   folic acid 1 MG tablet Commonly known as: FOLVITE Take 1 tablet (1 mg total) daily by mouth.   hydroxyurea 500 MG capsule Commonly known as: HYDREA TAKE 3 CAPSULES BY MOUTH DAILY What changed:   how much to take  how to take this  when to take this  additional instructions   nortriptyline 25 MG capsule Commonly known as: PAMELOR Take 1 capsule (25 mg total) by mouth at bedtime as needed for sleep.   oxyCODONE 15 mg 12 hr tablet Commonly known as: OxyCONTIN Take 1 tablet (15 mg total) by mouth every 12 (twelve) hours.   Oxycodone HCl 10 MG Tabs Take 1 tablet (10 mg total) by mouth every 6 (six) hours as needed.   sodium bicarbonate 650 MG tablet Take 650 mg by mouth 2 (two) times daily.   tacrolimus 1 MG capsule Commonly known as: PROGRAF Take 2 mg by mouth 2 (two) times daily.  The results of significant diagnostics from this hospitalization (including imaging, microbiology, ancillary and laboratory) are listed below for reference.    Significant Diagnostic Studies: Dg Chest 2 View  Result Date: 11/20/2018 CLINICAL DATA:  Chest pain which shortness-of-breath and dry cough since yesterday. EXAM: CHEST - 2 VIEW COMPARISON:  07/22/2018 and 10/16/2016 FINDINGS: Lungs are adequately inflated with chronic stable hazy prominence of the perihilar vessels. No lobar consolidation or effusion. Small nodule opacity over the lateral left base unchanged. Subtle nodular opacity over the anterior lung bases on the lateral film not well localized on the frontal film. Flattening of the hemidiaphragms on the lateral film. Cardiomediastinal silhouette and remainder of the exam is unchanged. IMPRESSION: No acute cardiopulmonary disease. Subtle nodular density over the anterior lung bases on the lateral film. Recommend follow-up chest radiograph 3-4 weeks versus noncontrast chest CT for further evaluation.  Electronically Signed   By: Marin Olp M.D.   On: 11/20/2018 12:58   Dg Ankle Complete Left  Result Date: 11/02/2018 CLINICAL DATA:  Pain, sickle crisis EXAM: LEFT FOOT - COMPLETE 3+ VIEW; LEFT ANKLE COMPLETE - 3+ VIEW COMPARISON:  08/20/2018 FINDINGS: No fracture or dislocation of the left foot. There is redemonstrated, sclerosis and severe distal arthritic change of the left first metatarsal at the metatarsophalangeal joint. The remaining osseous structures, including the left first proximal phalanx, are intact. The joint spaces are preserved with the exception of the first metatarsophalangeal joint. Soft tissues are unremarkable. IMPRESSION: No fracture or dislocation of the left foot. There is redemonstrated, sclerosis and severe distal arthritic change of the left first metatarsal at the metatarsophalangeal joint. The remaining osseous structures, including the left first proximal phalanx, are intact. 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 Electronically Signed   By: Eddie Candle M.D.   On: 11/02/2018 17:28   Dg Foot 2 Views Left  Result Date: 11/20/2018 CLINICAL DATA:  Possible sickle cell crisis EXAM: LEFT FOOT - 2 VIEW COMPARISON:  11/02/2018 FINDINGS: Increased sclerosis is again noted in the first metatarsal with arthritic changes of the first MTP joint. No acute fracture or dislocation is noted. No soft tissue abnormality is seen. IMPRESSION: Chronic changes in the first metatarsal stable from the prior exam. No acute abnormality noted. Electronically Signed   By: Inez Catalina M.D.   On: 11/20/2018 21:46   Dg Foot Complete Left  Result Date: 11/02/2018 CLINICAL DATA:  Pain, sickle crisis EXAM: LEFT FOOT - COMPLETE 3+ VIEW; LEFT ANKLE COMPLETE - 3+ VIEW COMPARISON:  08/20/2018 FINDINGS: No fracture or dislocation of the left foot. There is redemonstrated, sclerosis and severe distal  arthritic change of the left first metatarsal at the metatarsophalangeal joint. The remaining osseous structures, including the left first proximal phalanx, are intact. The joint spaces are preserved with the exception of the first metatarsophalangeal joint. Soft tissues are unremarkable. IMPRESSION: No fracture or dislocation of the left foot. There is redemonstrated, sclerosis and severe distal arthritic change of the left first metatarsal at the metatarsophalangeal joint. The remaining osseous structures, including the left first proximal phalanx, are intact. 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 Electronically Signed   By: Eddie Candle M.D.   On: 11/02/2018 17:28    Microbiology: Recent Results (from the past 240 hour(s))  SARS CORONAVIRUS 2 (TAT 6-24 HRS) Nasopharyngeal Nasopharyngeal Swab     Status: None  Collection Time: 11/20/18  6:16 PM   Specimen: Nasopharyngeal Swab  Result Value Ref Range Status   SARS Coronavirus 2 NEGATIVE NEGATIVE Final    Comment: (NOTE) SARS-CoV-2 target nucleic acids are NOT DETECTED. The SARS-CoV-2 RNA is generally detectable in upper and lower respiratory specimens during the acute phase of infection. Negative results do not preclude SARS-CoV-2 infection, do not rule out co-infections with other pathogens, and should not be used as the sole basis for treatment or other patient management decisions. Negative results must be combined with clinical observations, patient history, and epidemiological information. The expected result is Negative. Fact Sheet for Patients: SugarRoll.be Fact Sheet for Healthcare Providers: https://www.woods-mathews.com/ This test is not yet approved or cleared by the [place] FDA and  has been authorized for detection and/or diagnosis of SARS-CoV-2 by FDA under an Emergency  Use Authorization (EUA). This EUA will remain  in effect (meaning this test can be used) for the duration of the COVID-19 declaration under Section 56 4(b)(1) of the Act, 21 U.S.C. section 360bbb-3(b)(1), unless the authorization is terminated or revoked sooner. Performed at [facility], [place] [address], [place], [place] [address]      Labs: Basic Metabolic Panel: Recent Labs  Lab 11/20/18 1845 11/21/18 0355 11/22/18 0353  NA 140 139 136  K 3.9 4.0 4.3  CL 102 102 102  CO2 27 24 21*  GLUCOSE 92 92 83  BUN 15 14 13   CREATININE 2.59* 2.45* 2.27*  CALCIUM 8.6* 8.6* 8.6*   Liver Function Tests: Recent Labs  Lab 11/20/18 1845 11/22/18 0353  AST 57* 68*  ALT 75* 66*  ALKPHOS 192* 194*  BILITOT 1.2 1.1  PROT 6.0* 5.4*  ALBUMIN 3.1* 2.7*   No results for input(s): LIPASE, AMYLASE in the last 168 hours. No results for input(s): AMMONIA in the last 168 hours. CBC: Recent Labs  Lab 11/20/18 1845 11/20/18 2236 11/21/18 0355 11/22/18 0353  WBC 8.1  --   --  6.2  NEUTROABS 4.1  --   --   --   HGB 6.6* 6.7* 7.8* 7.8*  HCT 19.9* 19.8* 24.2* 23.7*  MCV 125.2*  --   --  107.2*  PLT 447*  --   --  334   Cardiac Enzymes: No results for input(s): CKTOTAL, CKMB, CKMBINDEX, TROPONINI in the last 168 hours. BNP: Invalid input(s): POCBNP CBG: No results for input(s): GLUCAP in the last 168 hours.  Time coordinating discharge: 50 minutes  Signed:  [name]  APRN, MSN, FNP-C [facility] [place], [place] [id number] [phone]  [facility] 11/22/2018, 1:15 PM

## 2018-11-22 NOTE — Progress Notes (Signed)
Patient refusing IV start because veins "keep blowing." He is requesting a PICC line because he had one in the past. Spoke with patient's RN. Re-consult IV team if needed.

## 2018-11-26 ENCOUNTER — Other Ambulatory Visit: Payer: Self-pay

## 2018-11-26 ENCOUNTER — Encounter: Payer: Self-pay | Admitting: Family Medicine

## 2018-11-26 ENCOUNTER — Ambulatory Visit (INDEPENDENT_AMBULATORY_CARE_PROVIDER_SITE_OTHER): Payer: Medicare Other | Admitting: Family Medicine

## 2018-11-26 VITALS — BP 110/85 | HR 63 | Temp 98.6°F | Resp 14 | Ht 63.0 in | Wt 129.0 lb

## 2018-11-26 DIAGNOSIS — N08 Glomerular disorders in diseases classified elsewhere: Secondary | ICD-10-CM | POA: Diagnosis not present

## 2018-11-26 DIAGNOSIS — M79672 Pain in left foot: Secondary | ICD-10-CM

## 2018-11-26 DIAGNOSIS — D571 Sickle-cell disease without crisis: Secondary | ICD-10-CM

## 2018-11-26 LAB — POCT URINALYSIS DIPSTICK
Bilirubin, UA: NEGATIVE
Glucose, UA: NEGATIVE
Ketones, UA: NEGATIVE
Leukocytes, UA: NEGATIVE
Nitrite, UA: NEGATIVE
Protein, UA: POSITIVE — AB
Spec Grav, UA: 1.02 (ref 1.010–1.025)
Urobilinogen, UA: 0.2 E.U./dL
pH, UA: 7.5 (ref 5.0–8.0)

## 2018-11-28 NOTE — Progress Notes (Signed)
Established Patient Office Visit  Subjective:  Patient ID: [name] [name]., male    DOB: 1980-03-12  Age: 38 y.o. MRN: [id number]  CC:  Chief Complaint  Patient presents with  . Sickle Cell Anemia  . Hospitalization Follow-up  . Foot Swelling    left foot   . Medication Refill    folic acid, amitryptlyine, vitamin D and oxycodone 15mg .     HPI [name] [name]., a 38 year old male with a medical history significant for sickle cell disease, type SS, chronic pain syndrome, opiate dependence, history of polysubstance abuse, history of sickle cell nephropathy, CKD stage III, and anemia of chronic disease presents for post hospital follow-up and left foot pain.  Patient was recently hospitalized on 11/22/2018 for sickle cell pain crisis.   Pain was primarily to left foot and left lower extremity.  Left foot pain worsened by weightbearing.  Pain improved prior to hospital discharge.  However, patient continues to have increased pain with prolonged standing and weightbearing primarily to left foot.  Patient underwent left foot x-ray while hospitalized.  X-ray showed no acute fracture or dislocation of the left foot.  There was sclerosis and severe distal arthritic changes of the left first metatarsal at the metatarsophalangeal joint; the first proximal phalanx was intact.  The findings were substantially progressed in comparison to radiographs that were dated 12/03/2015 and suggest posttraumatic arthrosis or alternatively advanced sequelae of a vascular necrosis.  His joint spaces were otherwise preserved.   Current pain intensity is 5-6/10.  Patient has been taking oxycodone 15 mg consistently with moderate relief.    Patient has a history of polysubstance abuse.    Most recent drug screen was negative for cocaine.  Patient is monitored periodically.  He denies any recent illicit drug use. Patient has a history of sickle cell nephropathy.  He is followed by nephrology monthly.  Patient  denies urinary frequency, urgency, dysuria, urinary retention, or low back pain. He also denies headache, dizziness, shortness of breath, dysuria, abdominal pain, nausea, vomiting, or diarrhea. Patient denies sick contacts, recent travel, or exposure to COVID-19.  Past Medical History:  Diagnosis Date  . Abscess of right iliac fossa 09/24/2018   required Perc Drain 09/24/2018  . Arachnoid Cyst of brain bilaterally    "2 really small ones in the back of my head; inside; saw them w/MRI" (09/25/2012)  . Bacterial pneumonia ~ 2012   "caught it here in the hospital" (09/25/2012)  . Chronic kidney disease    "from my sickle cell" (09/25/2012)  . CKD (chronic kidney disease), stage II   . Colitis 04/19/2017   CT scan abd/ pelvis  . GERD (gastroesophageal reflux disease)    "after I eat alot of spicey foods" (09/25/2012)  . Gynecomastia, male 07/10/2012  . History of blood transfusion    "always related to sickle cell crisis" (09/25/2012)  . 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   Noted in noted from H[facility]ology [facility] at [facility]  . Migraines    "take RX qd to prevent them" (09/25/2012)  . Opioid dependence with withdrawal ([place]) 08/30/2012  . Renal insufficiency   . Sickle cell anemia (HCC)   . Sickle cell crisis ([place]) 09/25/2012  . Sickle cell nephropathy ([place]) 07/10/2012  . Sinus tachycardia   . Tachycardia with heart rate 121-140 beats per minute with ambulation 08/04/2016    Past Surgical History:  Procedure Laterality Date  . ABCESS DRAINAGE    . CHOLECYSTECTOMY  ~ 2012  . COLONOSCOPY  N/A 04/23/2017   Procedure: COLONOSCOPY;  Surgeon: Irene Shipper, MD;  Location: [facility];  Service: Endoscopy;  Laterality: N/A;  . IR FLUORO GUIDE CV LINE RIGHT  12/17/2016  . IR RADIOLOGIST EVAL & MGMT  10/02/2018  . IR RADIOLOGIST EVAL & MGMT  10/15/2018  . IR REMOVAL TUN CV CATH W/O FL  12/21/2016  . IR US GUIDE VASC ACCESS RIGHT  12/17/2016  . spleenectomy      Family History  Problem  Relation Age of Onset  . Breast cancer Mother     Social History   Socioeconomic History  . Marital status: Single    Spouse name: Not on file  . Number of children: Not on file  . Years of education: Not on file  . Highest education level: Not on file  Occupational History  . Not on file  Social Needs  . Financial resource strain: Not on file  . Food insecurity    Worry: Not on file    Inability: Not on file  . Transportation needs    Medical: Not on file    Non-medical: Not on file  Tobacco Use  . Smoking status: Current Some Day Smoker    Packs/day: 0.10    Types: Cigarettes  . Smokeless tobacco: Never Used  . Tobacco comment: 09/25/2012 "I don't buy cigarettes; bum one from friends q now and then"  Substance and Sexual Activity  . Alcohol use: Yes    Alcohol/week: 0.0 standard drinks    Frequency: Never    Comment: Once a month  . Drug use: Not Currently    Types: Marijuana    Comment: Every 2-3 weeks   . Sexual activity: Yes  Lifestyle  . Physical activity    Days per week: Not on file    Minutes per session: Not on file  . Stress: Not on file  Relationships  . Social Herbalist on phone: Not on file    Gets together: Not on file    Attends religious service: Not on file    Active member of club or organization: Not on file    Attends meetings of clubs or organizations: Not on file    Relationship status: Not on file  . Intimate partner violence    Fear of current or ex partner: Not on file    Emotionally abused: Not on file    Physically abused: Not on file    Forced sexual activity: Not on file  Other Topics Concern  . Not on file  Social History Narrative    Lives alone in [place].   Back at school, studying Public relations account executive. Unemployed.    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      Outpatient Medications Prior to Visit  Medication Sig Dispense Refill  . cholecalciferol (VITAMIN D3) 25 MCG (1000 UT) tablet Take 2,000 Units by mouth daily.    . [facility]S VITAMIN B12 1000 MCG tablet Take 2,000 mcg by mouth daily.    . folic acid (FOLVITE) 1 MG tablet Take 1 tablet (1 mg total) daily by mouth. 30 tablet 2  . hydroxyurea (HYDREA) 500 MG capsule TAKE 3 CAPSULES BY MOUTH DAILY (Patient taking differently: Take 1,500 mg by mouth at bedtime. ) 270 capsule 1  . nortriptyline (PAMELOR) 25 MG capsule Take 1 capsule (25 mg  total) by mouth at bedtime as needed for sleep. 90 capsule 3  . oxyCODONE (OXYCONTIN) 15 mg 12 hr tablet Take 1 tablet (15 mg total) by mouth every 12 (twelve) hours. 60 tablet 0  . Oxycodone HCl 10 MG TABS Take 1 tablet (10 mg total) by mouth every 6 (six) hours as needed. 60 tablet 0  . sodium bicarbonate 650 MG tablet Take 650 mg by mouth 2 (two) times daily.    . tacrolimus (PROGRAF) 1 MG capsule Take 2 mg by mouth 2 (two) times daily.     No facility-administered medications prior to visit.     Allergies  Allergen Reactions  . Nsaids Other (See Comments)    Kidney disease  . Morphine And Related Other (See Comments)    "Real bad headaches"     ROS Review of Systems  Constitutional: Negative for activity change, appetite change and fatigue.  HENT: Negative.   Respiratory: Negative for shortness of breath.   Cardiovascular: Negative for leg swelling.  Gastrointestinal: Negative for blood in stool.  Endocrine: Negative.   Genitourinary: Negative.   Musculoskeletal: Positive for joint swelling and myalgias.       Left foot pain and swelling  Neurological: Negative.   Hematological: Negative.   Psychiatric/Behavioral: Negative.       Objective:    Physical Exam  Constitutional: He is oriented to person, place, and time. He appears well-developed and well-nourished.  HENT:  Head: Normocephalic.  Eyes: Pupils are equal, round, and reactive to  light.  Neck: Normal range of motion.  Cardiovascular: Normal rate and regular rhythm.  Pulmonary/Chest: Effort normal and breath sounds normal.  Abdominal: Soft. Bowel sounds are normal.  Musculoskeletal:     Left ankle: He exhibits decreased range of motion and swelling. He exhibits no deformity and normal pulse.  Neurological: He is alert and oriented to person, place, and time.  Skin: Skin is warm and dry.  Psychiatric: He has a normal mood and affect. His behavior is normal. Judgment and thought content normal.    BP 110/85 (BP Location: Left Arm, Patient Position: Sitting, Cuff Size: Normal)   Pulse 63   Temp 98.6 F (37 C) (Oral)   Resp 14   Ht 5\' 3"  (1.6 m)   Wt 129 lb (58.5 kg)   SpO2 99%   BMI 22.85 kg/m  Wt Readings from Last 3 Encounters:  11/26/18 129 lb (58.5 kg)  11/20/18 122 lb 11.2 oz (55.7 kg)  11/02/18 132 lb (59.9 kg)     Health Maintenance Due  Topic Date Due  . INFLUENZA VACCINE  09/14/2018    There are no preventive care reminders to display for this patient.  Lab Results  Component Value Date   TSH 2.860 05/11/2017   Lab Results  Component Value Date   WBC 6.2 11/22/2018   HGB 7.8 (L) 11/22/2018   HCT 23.7 (L) 11/22/2018   MCV 107.2 (H) 11/22/2018   PLT 334 11/22/2018   Lab Results  Component Value Date   NA 136 11/22/2018   K 4.3 11/22/2018   CO2 21 (L) 11/22/2018   GLUCOSE 83 11/22/2018   BUN 13 11/22/2018   CREATININE 2.27 (H) 11/22/2018   BILITOT 1.1 11/22/2018   ALKPHOS 194 (H) 11/22/2018   AST 68 (H) 11/22/2018   ALT 66 (H) 11/22/2018   PROT 5.4 (L) 11/22/2018   ALBUMIN 2.7 (L) 11/22/2018   CALCIUM 8.6 (L) 11/22/2018   ANIONGAP 13 11/22/2018   Lab Results  Component Value Date   CHOL 145 03/07/2018   Lab Results  Component Value Date   HDL 65 03/07/2018   Lab Results  Component Value Date   LDLCALC 30 03/07/2018   Lab Results  Component Value Date   TRIG 251 (H) 03/07/2018   Lab Results  Component Value  Date   CHOLHDL 2.2 03/07/2018   Lab Results  Component Value Date   HGBA1C <4.2 (L) 03/07/2018      Assessment & Plan:   Problem List Items Addressed This Visit      Cardiovascular and Mediastinum   Sickle cell nephropathy (HCC) (Chronic)   Relevant Orders   Urinalysis Dipstick (Completed)    Other Visit Diagnoses    Hb-SS disease without crisis ([place])    -  Primary   Relevant Orders   Urinalysis Dipstick (Completed)   CBC   Comprehensive metabolic panel   Left foot pain       Relevant Orders   AMB referral to orthopedics      No orders of the defined types were placed in this encounter. Sickle cell nephropathy with crisis [facility]) Patient has a history of sickle cell nephropathy. He is followed by [facility]. Prior to hospital discharge, creatinine was 2.2 and GFR 42 which was consistent with patient's baseline. Patient will continue to follow up with nephrology.  Also, continue prednisone as prescribed.  Recommend continuing renal diet as discussed - Urinalysis Dipstick  Hb-SS disease without crisis ([place])  Prior to hospital discharge, hemoglobin was 7.8 following transfusion of 1 unit packed red blood cells. Patient's baseline is 7-8. Repeat CBC with differential.  Continue Hydrea. We discussed the need for good hydration, monitoring of hydration status, avoidance of heat, cold, stress, and infection triggers. We discussed the risks and benefits of Hydrea, including bone marrow suppression, the possibility of GI upset, skin ulcers, hair thinning, and teratogenicity. The patient was reminded of the need to seek medical attention of any symptoms of bleeding, anemia, or infection. Continue folic acid 1 mg daily to prevent aplastic bone marrow crises.   Pulmonary evaluation - Patient denies severe recurrent wheezes, shortness of breath with exercise, or persistent cough. If these symptoms develop, pulmonary function tests with spirometry will be ordered, and if  abnormal, plan on referral to Pulmonology for further evaluation.  Cardiac - Routine screening for pulmonary hypertension is not recommended.  Eye - High risk of proliferative retinopathy. Annual eye exam with retinal exam recommended to patient.  Acute and chronic painful episodes - Discussed pain medication regimen at length. Will review PDMP prior to prescribing pain medications.   - CBC with Differential - Comprehensive metabolic panel  Left foot pain Patient continues to have left foot pain that is worsened with weight bearing. Patient warrants referral to orthopedic services.  - AMB referral to orthopedics  Follow-up: No follow-ups on file.    [name]  APRN, MSN, FNP-C Patient [facility] [address] [place], [place] [address] [phone]

## 2018-11-28 NOTE — Patient Instructions (Signed)
Sickle Cell Anemia, Adult ° °Sickle cell anemia is a condition where your red blood cells are shaped like sickles. Red blood cells carry oxygen through the body. Sickle-shaped cells do not live as long as normal red blood cells. They also clump together and block blood from flowing through the blood vessels. This prevents the body from getting enough oxygen. Sickle cell anemia causes organ damage and pain. It also increases the risk of infection. °Follow these instructions at home: °Medicines °· Take over-the-counter and prescription medicines only as told by your doctor. °· If you were prescribed an antibiotic medicine, take it as told by your doctor. Do not stop taking the antibiotic even if you start to feel better. °· If you develop a fever, do not take medicines to lower the fever right away. Tell your doctor about the fever. °Managing pain, stiffness, and swelling °· Try these methods to help with pain: °? Use a heating pad. °? Take a warm bath. °? Distract yourself, such as by watching TV. °Eating and drinking °· Drink enough fluid to keep your pee (urine) clear or pale yellow. Drink more in hot weather and during exercise. °· Limit or avoid alcohol. °· Eat a healthy diet. Eat plenty of fruits, vegetables, whole grains, and lean protein. °· Take vitamins and supplements as told by your doctor. °Traveling °· When traveling, keep these with you: °? Your medical information. °? The names of your doctors. °? Your medicines. °· If you need to take an airplane, talk to your doctor first. °Activity °· Rest often. °· Avoid exercises that make your heart beat much faster, such as jogging. °General instructions °· Do not use products that have nicotine or tobacco, such as cigarettes and e-cigarettes. If you need help quitting, ask your doctor. °· Consider wearing a medical alert bracelet. °· Avoid being in high places (high altitudes), such as mountains. °· Avoid very hot or cold temperatures. °· Avoid places where the  temperature changes a lot. °· Keep all follow-up visits as told by your doctor. This is important. °Contact a doctor if: °· A joint hurts. °· Your feet or hands hurt or swell. °· You feel tired (fatigued). °Get help right away if: °· You have symptoms of infection. These include: °? Fever. °? Chills. °? Being very tired. °? Irritability. °? Poor eating. °? Throwing up (vomiting). °· You feel dizzy or faint. °· You have new stomach pain, especially on the left side. °· You have a an erection (priapism) that lasts more than 4 hours. °· You have numbness in your arms or legs. °· You have a hard time moving your arms or legs. °· You have trouble talking. °· You have pain that does not go away when you take medicine. °· You are short of breath. °· You are breathing fast. °· You have a long-term cough. °· You have pain in your chest. °· You have a bad headache. °· You have a stiff neck. °· Your stomach looks bloated even though you did not eat much. °· Your skin is pale. °· You suddenly cannot see well. °Summary °· Sickle cell anemia is a condition where your red blood cells are shaped like sickles. °· Follow your doctor's advice on ways to manage pain, food to eat, activities to do, and steps to take for safe travel. °· Get medical help right away if you have any signs of infection, such as a fever. °This information is not intended to replace advice given to you by   your health care provider. Make sure you discuss any questions you have with your health care provider. °Document Released: 11/20/2012 Document Revised: 05/24/2018 Document Reviewed: 03/07/2016 °Elsevier Patient Education © 2020 [employer] ° °

## 2018-11-30 ENCOUNTER — Other Ambulatory Visit: Payer: Self-pay

## 2018-11-30 ENCOUNTER — Inpatient Hospital Stay (HOSPITAL_COMMUNITY)
Admission: EM | Admit: 2018-11-30 | Discharge: 2018-12-03 | DRG: 812 | Disposition: A | Discharge status: Home / Self Care | Payer: Medicare Other | Attending: Internal Medicine | Admitting: Internal Medicine

## 2018-11-30 ENCOUNTER — Encounter (HOSPITAL_COMMUNITY): Payer: Self-pay | Admitting: Emergency Medicine

## 2018-11-30 ENCOUNTER — Emergency Department (HOSPITAL_COMMUNITY): Payer: Medicare Other

## 2018-11-30 DIAGNOSIS — Z886 Allergy status to analgesic agent status: Secondary | ICD-10-CM

## 2018-11-30 DIAGNOSIS — Z803 Family history of malignant neoplasm of breast: Secondary | ICD-10-CM

## 2018-11-30 DIAGNOSIS — D638 Anemia in other chronic diseases classified elsewhere: Secondary | ICD-10-CM | POA: Diagnosis present

## 2018-11-30 DIAGNOSIS — G894 Chronic pain syndrome: Secondary | ICD-10-CM | POA: Diagnosis present

## 2018-11-30 DIAGNOSIS — Z79899 Other long term (current) drug therapy: Secondary | ICD-10-CM

## 2018-11-30 DIAGNOSIS — D5700 Hb-SS disease with crisis, unspecified: Principal | ICD-10-CM | POA: Diagnosis present

## 2018-11-30 DIAGNOSIS — R079 Chest pain, unspecified: Secondary | ICD-10-CM | POA: Diagnosis not present

## 2018-11-30 DIAGNOSIS — Z885 Allergy status to narcotic agent status: Secondary | ICD-10-CM | POA: Diagnosis not present

## 2018-11-30 DIAGNOSIS — N058 Unspecified nephritic syndrome with other morphologic changes: Secondary | ICD-10-CM | POA: Diagnosis not present

## 2018-11-30 DIAGNOSIS — F1910 Other psychoactive substance abuse, uncomplicated: Secondary | ICD-10-CM | POA: Diagnosis present

## 2018-11-30 DIAGNOSIS — K219 Gastro-esophageal reflux disease without esophagitis: Secondary | ICD-10-CM | POA: Diagnosis present

## 2018-11-30 DIAGNOSIS — F1410 Cocaine abuse, uncomplicated: Secondary | ICD-10-CM | POA: Diagnosis present

## 2018-11-30 DIAGNOSIS — F17210 Nicotine dependence, cigarettes, uncomplicated: Secondary | ICD-10-CM | POA: Diagnosis present

## 2018-11-30 DIAGNOSIS — Z20828 Contact with and (suspected) exposure to other viral communicable diseases: Secondary | ICD-10-CM | POA: Diagnosis present

## 2018-11-30 DIAGNOSIS — N059 Unspecified nephritic syndrome with unspecified morphologic changes: Secondary | ICD-10-CM | POA: Diagnosis present

## 2018-11-30 DIAGNOSIS — N184 Chronic kidney disease, stage 4 (severe): Secondary | ICD-10-CM | POA: Diagnosis present

## 2018-11-30 DIAGNOSIS — G43909 Migraine, unspecified, not intractable, without status migrainosus: Secondary | ICD-10-CM | POA: Diagnosis present

## 2018-11-30 DIAGNOSIS — M549 Dorsalgia, unspecified: Secondary | ICD-10-CM | POA: Diagnosis not present

## 2018-11-30 LAB — CBC WITH DIFFERENTIAL/PLATELET
Abs Immature Granulocytes: 0.01 10*3/uL (ref 0.00–0.07)
Basophils Absolute: 0 10*3/uL (ref 0.0–0.1)
Basophils Relative: 1 %
Eosinophils Absolute: 0 10*3/uL (ref 0.0–0.5)
Eosinophils Relative: 1 %
HCT: 25.1 % — ABNORMAL LOW (ref 39.0–52.0)
Hemoglobin: 8.5 g/dL — ABNORMAL LOW (ref 13.0–17.0)
Immature Granulocytes: 0 %
Lymphocytes Relative: 53 %
Lymphs Abs: 2.3 10*3/uL (ref 0.7–4.0)
MCH: 36.6 pg — ABNORMAL HIGH (ref 26.0–34.0)
MCHC: 33.9 g/dL (ref 30.0–36.0)
MCV: 108.2 fL — ABNORMAL HIGH (ref 80.0–100.0)
Monocytes Absolute: 0.2 10*3/uL (ref 0.1–1.0)
Monocytes Relative: 5 %
Neutro Abs: 1.7 10*3/uL (ref 1.7–7.7)
Neutrophils Relative %: 40 %
Platelets: 378 10*3/uL (ref 150–400)
RBC: 2.32 MIL/uL — ABNORMAL LOW (ref 4.22–5.81)
WBC: 4.3 10*3/uL (ref 4.0–10.5)
nRBC: 1.2 % — ABNORMAL HIGH (ref 0.0–0.2)

## 2018-11-30 LAB — COMPREHENSIVE METABOLIC PANEL
ALT: 51 U/L — ABNORMAL HIGH (ref 0–44)
AST: 83 U/L — ABNORMAL HIGH (ref 15–41)
Albumin: 3.1 g/dL — ABNORMAL LOW (ref 3.5–5.0)
Alkaline Phosphatase: 229 U/L — ABNORMAL HIGH (ref 38–126)
Anion gap: 9 (ref 5–15)
BUN: 24 mg/dL — ABNORMAL HIGH (ref 6–20)
CO2: 17 mmol/L — ABNORMAL LOW (ref 22–32)
Calcium: 8.7 mg/dL — ABNORMAL LOW (ref 8.9–10.3)
Chloride: 107 mmol/L (ref 98–111)
Creatinine, Ser: 2.39 mg/dL — ABNORMAL HIGH (ref 0.61–1.24)
GFR calc Af Amer: 39 mL/min — ABNORMAL LOW (ref >60)
GFR calc non Af Amer: 34 mL/min — ABNORMAL LOW (ref >60)
Glucose, Bld: 94 mg/dL (ref 70–99)
Potassium: 4.2 mmol/L (ref 3.5–5.1)
Sodium: 133 mmol/L — ABNORMAL LOW (ref 135–145)
Total Bilirubin: 1.2 mg/dL (ref 0.3–1.2)
Total Protein: 7 g/dL (ref 6.5–8.1)

## 2018-11-30 LAB — RETICULOCYTES
Immature Retic Fract: 27.8 % — ABNORMAL HIGH (ref 2.3–15.9)
RBC.: 2.32 MIL/uL — ABNORMAL LOW (ref 4.22–5.81)
Retic Count, Absolute: 49.9 10*3/uL (ref 19.0–186.0)
Retic Ct Pct: 2.2 % (ref 0.4–3.1)

## 2018-11-30 MED ORDER — HYDROMORPHONE HCL 2 MG/ML IJ SOLN
2.0000 mg | INTRAMUSCULAR | Status: AC
  Filled 2018-11-30: qty 1

## 2018-11-30 MED ORDER — ONDANSETRON HCL 4 MG/2ML IJ SOLN
4.0000 mg | INTRAMUSCULAR | Status: DC | PRN
  Administered 2018-11-30 – 2018-12-01 (×2): 4 mg via INTRAVENOUS
  Filled 2018-11-30 (×2): qty 2

## 2018-11-30 MED ORDER — HYDROMORPHONE HCL 2 MG/ML IJ SOLN
2.0000 mg | INTRAMUSCULAR | Status: AC
  Administered 2018-11-30: 2 mg via INTRAVENOUS

## 2018-11-30 MED ORDER — HYDROMORPHONE HCL 2 MG/ML IJ SOLN
2.0000 mg | INTRAMUSCULAR | Status: AC
  Filled 2018-11-30 (×2): qty 1

## 2018-11-30 MED ORDER — HYDROMORPHONE HCL 2 MG/ML IJ SOLN: 2.0000 mg | INTRAMUSCULAR | Status: AC

## 2018-11-30 MED ORDER — SODIUM CHLORIDE 0.9% FLUSH
3.0000 mL | Freq: Once | INTRAVENOUS | Status: AC
  Administered 2018-11-30: 3 mL via INTRAVENOUS

## 2018-11-30 MED ORDER — DEXTROSE-NACL 5-0.45 % IV SOLN
INTRAVENOUS | Status: DC
  Administered 2018-11-30 – 2018-12-01 (×3): via INTRAVENOUS

## 2018-11-30 MED ORDER — HYDROMORPHONE HCL 2 MG/ML IJ SOLN
2.0000 mg | INTRAMUSCULAR | Status: AC
  Administered 2018-11-30 (×2): 2 mg via INTRAVENOUS

## 2018-11-30 MED ORDER — HYDROMORPHONE HCL 2 MG/ML IJ SOLN
2.0000 mg | INTRAMUSCULAR | Status: AC
  Administered 2018-11-30: 20:00:00 2 mg via SUBCUTANEOUS
  Filled 2018-11-30: qty 1

## 2018-11-30 NOTE — Progress Notes (Signed)
Spoke with admitting physician Dr.Crosley, patient needs to be on a medsurg department and does not need telemetry monitoring. Patient will be admitted to [facility].
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
## 2018-11-30 NOTE — ED Notes (Signed)
Attempted IV insertion x 2, unable to access.

## 2018-11-30 NOTE — ED Notes (Signed)
RN Ailene Ravel unable to take report.

## 2018-11-30 NOTE — Progress Notes (Signed)
Patient had red MEWS score (now yellow but no recent temperature taken). Pt. also has cardiac monitoring orders with medtele written in notes. Patient appears to not be stable/ needs telemetry floor. AC also reported patient needed covid results back prior to coming to floor. Roderick Pee

## 2018-11-30 NOTE — ED Notes (Addendum)
ED TO INPATIENT HANDOFF REPORT  ED Nurse Name and Phone #:  Leafy Kindle [phone]  S Name/Age/Gender [name] [name]. 38 y.o. male Room/Bed: WA15/WA15  Code Status   Code Status: Prior  Home/SNF/Other Home Patient oriented to: self, place, time and situation Is this baseline? Yes   Triage Complete: Triage complete  Chief Complaint sickle cell crisis  Triage Note Patient here from home with complaints of sickle cell pain crisis. Pain "all over". Home meds with no relief.    Allergies Allergies  Allergen Reactions  . Nsaids Other (See Comments)    Kidney disease  . Morphine And Related Other (See Comments)    "Real bad headaches"     Level of Care/Admitting Diagnosis ED Disposition    ED Disposition Condition [facility] Area: [place] [[id number]]  Level of Care: Med-Surg [16]  Covid Evaluation: Asymptomatic Screening Protocol (No Symptoms)  Diagnosis: [facility] crisis [id number]  Admitting Physician: [place], [place]  Attending Physician: Quintella Baton [[id number]]  Estimated length of stay: past midnight tomorrow  Certification:: I certify this patient will need inpatient services for at least 2 midnights  PT Class (Do Not Modify): Inpatient [101]  PT Acc Code (Do Not Modify): Private [1]       B Medical/Surgery History Past Medical History:  Diagnosis Date  . Abscess of right iliac fossa 09/24/2018   required Perc Drain 09/24/2018  . Arachnoid Cyst of brain bilaterally    "2 really small ones in the back of my head; inside; saw them w/MRI" (09/25/2012)  . Bacterial pneumonia ~ 2012   "caught it here in the hospital" (09/25/2012)  . Chronic kidney disease    "from my sickle cell" (09/25/2012)  . CKD (chronic kidney disease), stage II   . Colitis 04/19/2017   CT scan abd/ pelvis  . GERD (gastroesophageal reflux disease)    "after I eat alot of spicey foods" (09/25/2012)  . Gynecomastia, male 07/10/2012  .  History of blood transfusion    "always related to sickle cell crisis" (09/25/2012)  . 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   Noted in noted from [facility]ematology [facility] at [facility]  . Migraines    "take RX qd to prevent them" (09/25/2012)  . Opioid dependence with withdrawal ([place]) 08/30/2012  . Renal insufficiency   . Sickle cell anemia (HCC)   . Sickle cell crisis ([place]) 09/25/2012  . Sickle cell nephropathy ([place]) 07/10/2012  . Sinus tachycardia   . Tachycardia with heart rate 121-140 beats per minute with ambulation 08/04/2016   Past Surgical History:  Procedure Laterality Date  . ABCESS DRAINAGE    . CHOLECYSTECTOMY  ~ 2012  . COLONOSCOPY N/A 04/23/2017   Procedure: COLONOSCOPY;  Surgeon: Irene Shipper, MD;  Location: [facility];  Service: Endoscopy;  Laterality: N/A;  . IR FLUORO GUIDE CV LINE RIGHT  12/17/2016  . IR RADIOLOGIST EVAL & MGMT  10/02/2018  . IR RADIOLOGIST EVAL & MGMT  10/15/2018  . IR REMOVAL TUN CV CATH W/O FL  12/21/2016  . IR US GUIDE VASC ACCESS RIGHT  12/17/2016  . spleenectomy       A IV Location/Drains/Wounds Patient Lines/Drains/Airways Status   Active Line/Drains/Airways    Name:   Placement date:   Placement time:   Site:   Days:   Peripheral IV 11/30/18 Right Forearm   11/30/18    2022    Forearm   less than 1   Closed System  Drain 1 Right;Anterior Abdomen Bulb (JP) 10 Fr.   09/24/18    1033    Abdomen   67          Intake/Output Last 24 hours  Intake/Output Summary (Last 24 hours) at 11/30/2018 2237 Last data filed at 11/30/2018 2118 Gross per 24 hour  Intake 3 ml  Output -  Net 3 ml    Labs/Imaging Results for orders placed or performed during the hospital encounter of 11/30/18 (from the past 48 hour(s))  Comprehensive metabolic panel     Status: Abnormal   Collection Time: 11/30/18  8:22 PM  Result Value Ref Range   Sodium 133 (L) 135 - 145 mmol/L   Potassium 4.2 3.5 - 5.1 mmol/L   Chloride 107 98 - 111 mmol/L   CO2 17 (L) 22 -  32 mmol/L   Glucose, Bld 94 70 - 99 mg/dL   BUN 24 (H) 6 - 20 mg/dL   Creatinine, Ser 2.39 (H) 0.61 - 1.24 mg/dL   Calcium 8.7 (L) 8.9 - 10.3 mg/dL   Total Protein 7.0 6.5 - 8.1 g/dL   Albumin 3.1 (L) 3.5 - 5.0 g/dL   AST 83 (H) 15 - 41 U/L   ALT 51 (H) 0 - 44 U/L   Alkaline Phosphatase 229 (H) 38 - 126 U/L   Total Bilirubin 1.2 0.3 - 1.2 mg/dL   GFR calc non Af Amer 34 (L) >60 mL/min   GFR calc Af Amer 39 (L) >60 mL/min   Anion gap 9 5 - 15    Comment: Performed at [facility], [place] [address], [place], [place] [address]  CBC with Differential     Status: Abnormal   Collection Time: 11/30/18  8:22 PM  Result Value Ref Range   WBC 4.3 4.0 - 10.5 K/uL   RBC 2.32 (L) 4.22 - 5.81 MIL/uL   Hemoglobin 8.5 (L) 13.0 - 17.0 g/dL   HCT 25.1 (L) 39.0 - 52.0 %   MCV 108.2 (H) 80.0 - 100.0 fL   MCH 36.6 (H) 26.0 - 34.0 pg   MCHC 33.9 30.0 - 36.0 g/dL   RDW Not Measured 11.5 - 15.5 %   Platelets 378 150 - 400 K/uL   nRBC 1.2 (H) 0.0 - 0.2 %   Neutrophils Relative % 40 %   Neutro Abs 1.7 1.7 - 7.7 K/uL   Lymphocytes Relative 53 %   Lymphs Abs 2.3 0.7 - 4.0 K/uL   Monocytes Relative 5 %   Monocytes Absolute 0.2 0.1 - 1.0 K/uL   Eosinophils Relative 1 %   Eosinophils Absolute 0.0 0.0 - 0.5 K/uL   Basophils Relative 1 %   Basophils Absolute 0.0 0.0 - 0.1 K/uL   Immature Granulocytes 0 %   Abs Immature Granulocytes 0.01 0.00 - 0.07 K/uL   Target Cells PRESENT     Comment: Performed at [facility], [place] [address] [place], [place] [address]  Reticulocytes     Status: Abnormal   Collection Time: 11/30/18  8:22 PM  Result Value Ref Range   Retic Ct Pct 2.2 0.4 - 3.1 %   RBC. 2.32 (L) 4.22 - 5.81 MIL/uL   Retic Count, Absolute 49.9 19.0 - 186.0 K/uL   Immature Retic Fract 27.8 (H) 2.3 - 15.9 %    Comment: Performed at [facility], [place] [address], [place], [place] [address]   Dg Chest 2 View  Result Date: 11/30/2018 CLINICAL  DATA:  Chest  pain and sickle cell. EXAM: CHEST - 2 VIEW COMPARISON:  November 20, 2018 FINDINGS: The cardiomediastinal silhouette is normal. No pneumothorax. No focal infiltrates. The nodular density seen on the lateral view of the previous study is no longer visualized. No acute abnormalities. IMPRESSION: No active cardiopulmonary disease. Electronically Signed   By: Dorise Bullion III M.D   On: 11/30/2018 19:30    Pending Labs Unresulted Labs (From admission, onward)    Start     Ordered   11/30/18 2149  SARS CORONAVIRUS 2 (TAT 6-24 HRS) Nasopharyngeal Nasopharyngeal Swab  (Asymptomatic/Tier 2)  Once,   STAT    Question Answer Comment  Is this test for diagnosis or screening Screening   Symptomatic for COVID-19 as defined by CDC No   Hospitalized for COVID-19 No   Admitted to ICU for COVID-19 No   Previously tested for COVID-19 Yes   Resident in a congregate (group) care setting No   Employed in healthcare setting No      11/30/18 2148   Signed and Held  CBC  (enoxaparin (LOVENOX)    CrCl < 30 ml/min)  Once,   R    Comments: Baseline for enoxaparin therapy IF NOT ALREADY DRAWN.  Notify MD if PLT < 100 K.    Signed and Held   Signed and Held  Creatinine, serum  (enoxaparin (LOVENOX)    CrCl < 30 ml/min)  Once,   R    Comments: Baseline for enoxaparin therapy IF NOT ALREADY DRAWN.    Signed and Held   Signed and Held  Creatinine, serum  (enoxaparin (LOVENOX)    CrCl < 30 ml/min)  Weekly,   R    Comments: while on enoxaparin therapy.    Signed and Held   Signed and Held  Comprehensive metabolic panel  Tomorrow morning,   R     Signed and Held   Signed and Held  CBC  Tomorrow morning,   R     Signed and Held          Vitals/Pain Today's Vitals   11/30/18 2200 11/30/18 2200 11/30/18 2210 11/30/18 2227  BP: 108/81  (!) 94/50   Pulse: (!) 103  (!) 122   Resp: 16  (!) 26   Temp:      TempSrc:      SpO2: 97%  99%   PainSc:  10-Worst pain ever  8     Isolation Precautions No  active isolations  Medications Medications  dextrose 5 %-0.45 % sodium chloride infusion ( Intravenous New Bag/Given 11/30/18 2027)  HYDROmorphone (DILAUDID) injection 2 mg (2 mg Intravenous Not Given 11/30/18 2052)    Or  HYDROmorphone (DILAUDID) injection 2 mg ( Subcutaneous See Alternative 11/30/18 2052)  ondansetron (ZOFRAN) injection 4 mg (4 mg Intravenous Given 11/30/18 2025)  sodium chloride flush (NS) 0.9 % injection 3 mL (3 mLs Intravenous Given 11/30/18 2118)  HYDROmorphone (DILAUDID) injection 2 mg ( Intravenous See Alternative 11/30/18 2002)    Or  HYDROmorphone (DILAUDID) injection 2 mg (2 mg Subcutaneous Given 11/30/18 2002)  HYDROmorphone (DILAUDID) injection 2 mg (2 mg Intravenous Given 11/30/18 2045)    Or  HYDROmorphone (DILAUDID) injection 2 mg ( Subcutaneous See Alternative 11/30/18 2045)  HYDROmorphone (DILAUDID) injection 2 mg (2 mg Intravenous Given 11/30/18 2205)    Or  HYDROmorphone (DILAUDID) injection 2 mg ( Subcutaneous See Alternative 11/30/18 2205)    Mobility walks Low fall risk     R Recommendations: See Admitting Provider Note  Report given to:  5W room 1524

## 2018-11-30 NOTE — H&P (Signed)
PCP:   [name], MD   Chief Complaint:  Pain   HPI: this is a 38 y/o male with h/o SS and CKD, who presents with c/o pain. He states he has had no appetite in the last 2 days because he has not been feeling good. He had nausea and vomiting. He woke up with pain from his sickle cell. His chest was hurting. He states this is his typical sickle cell pain. He states his pain is generalized all throughout his body. He took his regular medications but did not take any extra. His regular meds did not improve his pain. At its worse his pain is 9/10.  He denies fevers but reports chills. He reports a slight cough. He reports SOB  Review of Systems:  The patient denies anorexia, fever, chills, cough, weight loss,, vision loss, decreased hearing, hoarseness, chest pain, generalized pain, nausea and vomiting, syncope, dyspnea on exertion, peripheral edema, balance deficits, hemoptysis, abdominal pain, melena, hematochezia, severe indigestion/heartburn, hematuria, incontinence, genital sores, muscle weakness, suspicious skin lesions, transient blindness, difficulty walking, depression, unusual weight change, abnormal bleeding, enlarged lymph nodes, angioedema, and breast masses.  Past Medical History: Past Medical History:  Diagnosis Date  . Abscess of right iliac fossa 09/24/2018   required Perc Drain 09/24/2018  . Arachnoid Cyst of brain bilaterally    "2 really small ones in the back of my head; inside; saw them w/MRI" (09/25/2012)  . Bacterial pneumonia ~ 2012   "caught it here in the hospital" (09/25/2012)  . Chronic kidney disease    "from my sickle cell" (09/25/2012)  . CKD (chronic kidney disease), stage II   . Colitis 04/19/2017   CT scan abd/ pelvis  . GERD (gastroesophageal reflux disease)    "after I eat alot of spicey foods" (09/25/2012)  . Gynecomastia, male 07/10/2012  . History of blood transfusion    "always related to sickle cell crisis" (09/25/2012)  . 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 Migraines    "take RX qd to prevent them" (09/25/2012)  . Opioid dependence with withdrawal ([place]) 08/30/2012  . Renal insufficiency   . Sickle cell anemia (HCC)   . Sickle cell crisis ([place]) 09/25/2012  . Sickle cell nephropathy ([place]) 07/10/2012  . Sinus tachycardia   . Tachycardia with heart rate 121-140 beats per minute with ambulation 08/04/2016   Past Surgical History:  Procedure Laterality Date  . ABCESS DRAINAGE    . CHOLECYSTECTOMY  ~ 2012  . COLONOSCOPY N/A 04/23/2017   Procedure: COLONOSCOPY;  Surgeon: Irene Shipper, MD;  Location: [facility];  Service: Endoscopy;  Laterality: N/A;  . IR FLUORO GUIDE CV LINE RIGHT  12/17/2016  . IR RADIOLOGIST EVAL & MGMT  10/02/2018  . IR RADIOLOGIST EVAL & MGMT  10/15/2018  . IR REMOVAL TUN CV CATH W/O FL  12/21/2016  . IR US GUIDE VASC ACCESS RIGHT  12/17/2016  . spleenectomy      Medications: Prior to Admission medications   Medication Sig Start Date End Date Taking? Authorizing Provider  cholecalciferol (VITAMIN D3) 25 MCG (1000 UT) tablet Take 2,000 Units by mouth daily.   Yes [provider]  folic acid (FOLVITE) 1 MG tablet Take 1 tablet (1 mg total) daily by mouth. 12/22/16  Yes Leana Gamer, MD  hydroxyurea (HYDREA) 500 MG capsule TAKE 3 CAPSULES BY MOUTH DAILY Patient taking differently: Take 1,500 mg by mouth daily.  06/19/18  Yes Lanae Boast, FNP  nortriptyline (PAMELOR) 25 MG capsule Take 1 capsule (25 mg total) by mouth at bedtime as needed for sleep. 06/10/18  Yes Donnamae Jude, MD  oxyCODONE (OXYCONTIN) 15 mg 12 hr tablet Take 1 tablet (15 mg total) by mouth every 12 (twelve) hours. 11/05/18  Yes [name], FNP  Oxycodone HCl 10 MG TABS Take 1 tablet (10 mg total) by mouth every 6 (six) hours as needed. Patient taking differently: Take 10 mg by mouth every 6 (six) hours as needed (pain).  11/22/18  Yes [name], FNP  sodium bicarbonate 650 MG tablet Take 650 mg by mouth 2 (two) times daily. 11/07/18  Yes [provider]  tacrolimus (PROGRAF) 1 MG capsule Take 2 mg by mouth 2 (two) times daily. 11/12/18  Yes [provider]    Allergies:   Allergies  Allergen Reactions  . Nsaids Other (See Comments)    Kidney disease  . Morphine And Related Other (See Comments)    "Real bad headaches"     Social History:  reports that he has been smoking cigarettes. He has been smoking about 0.10 packs per day. He has never used smokeless tobacco. He reports current alcohol use. He reports previous drug use. Drug: Marijuana.  Family History: Family History  Problem Relation Age of Onset  . Breast cancer Mother     Physical Exam: Vitals:   11/30/18 2000 11/30/18 2030 11/30/18 2100 11/30/18 2130  BP: (!) 120/91 (!) 123/91 122/76 111/73  Pulse: 89 (!) 104 (!) 107 (!) 106  Resp: 19 16 (!) 9 16  Temp:      TempSrc:      SpO2: 100% 99% 95% 97%    General:  Alert and oriented times three, well developed and nourished, no acute distress, nonspecific generalized TTP Eyes: PERRLA, pink conjunctiva, no scleral icterus ENT: Moist oral mucosa, neck supple, no thyromegaly Lungs: clear to ascultation, no wheeze, no crackles, no use of accessory muscles Cardiovascular: regular rate and rhythm, no regurgitation, no gallops, no murmurs. No carotid bruits, no JVD Abdomen: soft, positive BS, non-tender, non-distended, no organomegaly, not an acute abdomen GU: not examined Neuro: CN II - XII grossly intact, sensation intact Musculoskeletal: strength 5/5 all extremities, no clubbing, cyanosis or edema Skin: no rash, no subcutaneous crepitation, no decubitus Psych: appropriate patient   Labs on Admission:  Recent Labs    11/30/18 2022  NA 133*  K 4.2  CL 107  CO2 17*  GLUCOSE 94  BUN 24*  CREATININE 2.39*  CALCIUM 8.7*   Recent Labs    11/30/18 2022  AST 83*  ALT 51*  ALKPHOS 229*   BILITOT 1.2  PROT 7.0  ALBUMIN 3.1*   No results for input(s): LIPASE, AMYLASE in the last 72 hours. Recent Labs    11/30/18 2022  WBC 4.3  NEUTROABS 1.7  HGB 8.5*  HCT 25.1*  MCV 108.2*  PLT 378   No results for input(s): CKTOTAL, CKMB, CKMBINDEX, TROPONINI in the last 72 hours. Invalid input(s): POCBNP No results for input(s): DDIMER in the last 72 hours. No results for input(s): HGBA1C in the last 72 hours. No results for input(s): CHOL, HDL, LDLCALC, TRIG, CHOLHDL, LDLDIRECT in the last 72 hours. No results for input(s): TSH, T4TOTAL, T3FREE, THYROIDAB in the last 72 hours.  Invalid input(s): [place]    11/30/18 2022  RETICCTPCT 2.2    Micro Results: No results found for this or any previous  visit (from the past 240 hour(s)).   Radiological Exams on Admission: Dg Chest 2 View  Result Date: 11/30/2018 CLINICAL DATA:  Chest pain and sickle cell. EXAM: CHEST - 2 VIEW COMPARISON:  November 20, 2018 FINDINGS: The cardiomediastinal silhouette is normal. No pneumothorax. No focal infiltrates. The nodular density seen on the lateral view of the previous study is no longer visualized. No acute abnormalities. IMPRESSION: No active cardiopulmonary disease. Electronically Signed   By: Dorise Bullion III M.D   On: 11/30/2018 19:30    Assessment/Plan Present on Admission: . Sickle cell anemia with crisis ([place]) -admit to medtele -D51/2NS @ 75cc/hr -dilaudid PRN -continue hydroxyurea and folic acid -r/o covid  . CKD (chronic kidney disease), stage IV (HCC) -stable, at baseline -prograf held. Defer to AM to restart in AM  . Immune-complex glomerulonephritis -aware  . Polysubstance abuse ([place]) -aware  [name] [name] 11/30/2018, 9:34 PM

## 2018-11-30 NOTE — Progress Notes (Signed)
Spoke with AC to verify pt is stable. No tele or contact precautions needed at this time. COVID test pending. RN approved pt to 5W.

## 2018-11-30 NOTE — ED Triage Notes (Signed)
Patient here from home with complaints of sickle cell pain crisis. Pain "all over". Home meds with no relief.

## 2018-11-30 NOTE — ED Notes (Signed)
Pt left for xray

## 2018-12-01 DIAGNOSIS — D5700 Hb-SS disease with crisis, unspecified: Principal | ICD-10-CM

## 2018-12-01 DIAGNOSIS — N184 Chronic kidney disease, stage 4 (severe): Secondary | ICD-10-CM

## 2018-12-01 DIAGNOSIS — N058 Unspecified nephritic syndrome with other morphologic changes: Secondary | ICD-10-CM

## 2018-12-01 DIAGNOSIS — F1910 Other psychoactive substance abuse, uncomplicated: Secondary | ICD-10-CM

## 2018-12-01 LAB — RAPID URINE DRUG SCREEN, HOSP PERFORMED
Amphetamines: NOT DETECTED
Barbiturates: NOT DETECTED
Benzodiazepines: NOT DETECTED
Cocaine: POSITIVE — AB
Opiates: POSITIVE — AB
Tetrahydrocannabinol: POSITIVE — AB

## 2018-12-01 LAB — COMPREHENSIVE METABOLIC PANEL
ALT: 61 U/L — ABNORMAL HIGH (ref 0–44)
AST: 115 U/L — ABNORMAL HIGH (ref 15–41)
Albumin: 2.7 g/dL — ABNORMAL LOW (ref 3.5–5.0)
Alkaline Phosphatase: 240 U/L — ABNORMAL HIGH (ref 38–126)
Anion gap: 9 (ref 5–15)
BUN: 23 mg/dL — ABNORMAL HIGH (ref 6–20)
CO2: 15 mmol/L — ABNORMAL LOW (ref 22–32)
Calcium: 8.6 mg/dL — ABNORMAL LOW (ref 8.9–10.3)
Chloride: 109 mmol/L (ref 98–111)
Creatinine, Ser: 2.75 mg/dL — ABNORMAL HIGH (ref 0.61–1.24)
GFR calc Af Amer: 33 mL/min — ABNORMAL LOW (ref >60)
GFR calc non Af Amer: 29 mL/min — ABNORMAL LOW (ref >60)
Glucose, Bld: 100 mg/dL — ABNORMAL HIGH (ref 70–99)
Potassium: 4.2 mmol/L (ref 3.5–5.1)
Sodium: 133 mmol/L — ABNORMAL LOW (ref 135–145)
Total Bilirubin: 1.2 mg/dL (ref 0.3–1.2)
Total Protein: 6 g/dL — ABNORMAL LOW (ref 6.5–8.1)

## 2018-12-01 LAB — CBC
HCT: 24 % — ABNORMAL LOW (ref 39.0–52.0)
Hemoglobin: 8.2 g/dL — ABNORMAL LOW (ref 13.0–17.0)
MCH: 36.1 pg — ABNORMAL HIGH (ref 26.0–34.0)
MCHC: 34.2 g/dL (ref 30.0–36.0)
MCV: 105.7 fL — ABNORMAL HIGH (ref 80.0–100.0)
Platelets: 271 10*3/uL (ref 150–400)
RBC: 2.27 MIL/uL — ABNORMAL LOW (ref 4.22–5.81)
WBC: 3.8 10*3/uL — ABNORMAL LOW (ref 4.0–10.5)
nRBC: 1.6 % — ABNORMAL HIGH (ref 0.0–0.2)

## 2018-12-01 LAB — SARS CORONAVIRUS 2 (TAT 6-24 HRS): SARS Coronavirus 2: NEGATIVE

## 2018-12-01 MED ORDER — SODIUM BICARBONATE 650 MG PO TABS
650.0000 mg | ORAL_TABLET | Freq: Two times a day (BID) | ORAL | Status: DC
  Administered 2018-12-01 – 2018-12-03 (×6): 650 mg via ORAL
  Filled 2018-12-01 (×6): qty 1

## 2018-12-01 MED ORDER — HYDROMORPHONE HCL 1 MG/ML IJ SOLN
2.0000 mg | INTRAMUSCULAR | Status: DC | PRN
  Administered 2018-12-01 (×3): 2 mg via INTRAVENOUS
  Filled 2018-12-01 (×3): qty 2

## 2018-12-01 MED ORDER — FOLIC ACID 1 MG PO TABS
1.0000 mg | ORAL_TABLET | Freq: Every day | ORAL | Status: DC
  Administered 2018-12-01 – 2018-12-03 (×3): 1 mg via ORAL
  Filled 2018-12-01 (×3): qty 1

## 2018-12-01 MED ORDER — OXYCODONE HCL 5 MG PO TABS
10.0000 mg | ORAL_TABLET | Freq: Four times a day (QID) | ORAL | Status: DC | PRN
  Administered 2018-12-02 (×2): 10 mg via ORAL
  Filled 2018-12-01 (×2): qty 2

## 2018-12-01 MED ORDER — SODIUM CHLORIDE 0.9% FLUSH: 9.0000 mL | INTRAVENOUS | Status: DC | PRN

## 2018-12-01 MED ORDER — NORTRIPTYLINE HCL 25 MG PO CAPS
25.0000 mg | ORAL_CAPSULE | Freq: Every evening | ORAL | Status: DC | PRN
  Administered 2018-12-02: 25 mg via ORAL
  Filled 2018-12-01 (×3): qty 1

## 2018-12-01 MED ORDER — ACETAMINOPHEN 325 MG PO TABS: 650.0000 mg | ORAL_TABLET | Freq: Four times a day (QID) | ORAL | Status: DC | PRN

## 2018-12-01 MED ORDER — ENSURE ENLIVE PO LIQD
237.0000 mL | Freq: Two times a day (BID) | ORAL | Status: DC
  Administered 2018-12-01: 237 mL via ORAL

## 2018-12-01 MED ORDER — ADULT MULTIVITAMIN W/MINERALS CH
1.0000 | ORAL_TABLET | Freq: Every day | ORAL | Status: DC
  Administered 2018-12-01: 1 via ORAL
  Filled 2018-12-01 (×3): qty 1

## 2018-12-01 MED ORDER — HYDROMORPHONE 1 MG/ML IV SOLN
INTRAVENOUS | Status: DC
  Administered 2018-12-01: 30 mg via INTRAVENOUS
  Administered 2018-12-01: 4.5 mg via INTRAVENOUS
  Filled 2018-12-01: qty 30

## 2018-12-01 MED ORDER — ENOXAPARIN SODIUM 30 MG/0.3ML ~~LOC~~ SOLN
30.0000 mg | Freq: Every day | SUBCUTANEOUS | Status: DC
  Administered 2018-12-01 – 2018-12-03 (×3): 30 mg via SUBCUTANEOUS
  Filled 2018-12-01 (×3): qty 0.3

## 2018-12-01 MED ORDER — ACETAMINOPHEN 650 MG RE SUPP: 650.0000 mg | Freq: Four times a day (QID) | RECTAL | Status: DC | PRN

## 2018-12-01 MED ORDER — OXYCODONE HCL ER 15 MG PO T12A
15.0000 mg | EXTENDED_RELEASE_TABLET | Freq: Two times a day (BID) | ORAL | Status: DC
  Administered 2018-12-01 – 2018-12-03 (×6): 15 mg via ORAL
  Filled 2018-12-01 (×6): qty 1

## 2018-12-01 MED ORDER — VITAMIN D 25 MCG (1000 UNIT) PO TABS
2000.0000 [IU] | ORAL_TABLET | Freq: Every day | ORAL | Status: DC
  Administered 2018-12-01 – 2018-12-03 (×3): 2000 [IU] via ORAL
  Filled 2018-12-01 (×3): qty 2

## 2018-12-01 MED ORDER — TACROLIMUS 1 MG PO CAPS
2.0000 mg | ORAL_CAPSULE | Freq: Two times a day (BID) | ORAL | Status: DC
  Administered 2018-12-01 – 2018-12-03 (×4): 2 mg via ORAL
  Filled 2018-12-01 (×6): qty 2

## 2018-12-01 MED ORDER — NALOXONE HCL 0.4 MG/ML IJ SOLN: 0.4000 mg | INTRAMUSCULAR | Status: DC | PRN

## 2018-12-01 MED ORDER — HYDROXYUREA 500 MG PO CAPS: 1500.0000 mg | ORAL_CAPSULE | Freq: Every day | ORAL | Status: DC

## 2018-12-01 NOTE — Progress Notes (Signed)
Patient ID: [name] Pick., male   DOB: [birth year], 37 y.o.   MRN: [id number] Subjective: [name] Pick., a 38 year old male with a medical history significant for sickle cell disease, type SS, chronic pain syndrome, opiate dependence, history of polysubstance abuse, history of sickle cell nephropathy, CKD stage III, and anemia of chronic disease who was admitted yesterday for sickle cell pain crisis.  Today patient continues to complain of significant pain, all over, rated at 9/10.  He describes this pain as constant and throbbing.  He however denies any fever, headache, cough, chest pain, shortness of breath, nausea, vomiting or diarrhea, no urinary symptoms or change in bowel habit.  Objective:  Vital signs in last 24 hours:  Vitals:   12/01/18 1236 12/01/18 1243 12/01/18 1347 12/01/18 1539  BP:  92/62 94/68   Pulse:  86 78   Resp: 16 16 16 14   Temp:  98.3 F (36.8 C) 98.4 F (36.9 C)   TempSrc:  Oral Oral   SpO2: 100% 100% 100% 96%    Intake/Output from previous day:   Intake/Output Summary (Last 24 hours) at 12/01/2018 1659 Last data filed at 12/01/2018 1338 Gross per 24 hour  Intake 890.02 ml  Output 800 ml  Net 90.02 ml    Physical Exam: General: Alert, awake, oriented x3, in no acute distress.  Round moon-shaped face HEENT: [place]/AT PEERL, EOMI Neck: Trachea midline,  no masses, no thyromegal,y no JVD, no carotid bruit OROPHARYNX:  Moist, No exudate/ erythema/lesions.  Heart: Regular rate and rhythm, without murmurs, rubs, gallops, PMI non-displaced, no heaves or thrills on palpation.  Lungs: Clear to auscultation, no wheezing or rhonchi noted. No increased vocal fremitus resonant to percussion  Abdomen: Soft, nontender, nondistended, positive bowel sounds, no masses no hepatosplenomegaly noted..  Neuro: No focal neurological deficits noted cranial nerves II through XII grossly intact. DTRs 2+ bilaterally upper and lower extremities. Strength 5 out of 5 in  bilateral upper and lower extremities. Musculoskeletal: No warm swelling or erythema around joints, no spinal tenderness noted. Psychiatric: Patient alert and oriented x3, good insight and cognition, good recent to remote recall. Lymph node survey: No cervical axillary or inguinal lymphadenopathy noted.  Lab Results:  Basic Metabolic Panel:    Component Value Date/Time   NA 133 (L) 12/01/2018 0654   NA 142 10/01/2018 1430   K 4.2 12/01/2018 0654   CL 109 12/01/2018 0654   CO2 15 (L) 12/01/2018 0654   BUN 23 (H) 12/01/2018 0654   BUN 42 (H) 10/01/2018 1430   CREATININE 2.75 (H) 12/01/2018 0654   CREATININE 1.45 (H) 05/12/2014 1430   GLUCOSE 100 (H) 12/01/2018 0654   CALCIUM 8.6 (L) 12/01/2018 0654   CBC:    Component Value Date/Time   WBC 3.8 (L) 12/01/2018 0654   HGB 8.2 (L) 12/01/2018 0654   HGB 7.4 (L) 10/01/2018 1430   HCT 24.0 (L) 12/01/2018 0654   HCT 22.6 (L) 10/01/2018 1430   PLT 271 12/01/2018 0654   PLT 391 10/01/2018 1430   MCV 105.7 (H) 12/01/2018 0654   MCV 102 (H) 10/01/2018 1430   NEUTROABS 1.7 11/30/2018 2022   NEUTROABS 13.1 (H) 10/01/2018 1430   LYMPHSABS 2.3 11/30/2018 2022   LYMPHSABS 3.7 (H) 10/01/2018 1430   MONOABS 0.2 11/30/2018 2022   EOSABS 0.0 11/30/2018 2022   EOSABS 0.0 10/01/2018 1430   BASOSABS 0.0 11/30/2018 2022   BASOSABS 0.0 10/01/2018 1430    Recent Results (from the past 240 hour(s))  SARS CORONAVIRUS 2 (TAT 6-24 HRS) Nasopharyngeal Nasopharyngeal Swab     Status: None   Collection Time: 11/30/18 10:08 PM   Specimen: Nasopharyngeal Swab  Result Value Ref Range Status   SARS Coronavirus 2 NEGATIVE NEGATIVE Final    Comment: (NOTE) SARS-CoV-2 target nucleic acids are NOT DETECTED. The SARS-CoV-2 RNA is generally detectable in upper and lower respiratory specimens during the acute phase of infection. Negative results do not preclude SARS-CoV-2 infection, do not rule out co-infections with other pathogens, and should not be used  as the sole basis for treatment or other patient management decisions. Negative results must be combined with clinical observations, patient history, and epidemiological information. The expected result is Negative. Fact Sheet for Patients: HairSlick.no Fact Sheet for Healthcare Providers: quierodirigir.com This test is not yet approved or cleared by the [place] FDA and  has been authorized for detection and/or diagnosis of SARS-CoV-2 by FDA under an Emergency Use Authorization (EUA). This EUA will remain  in effect (meaning this test can be used) for the duration of the COVID-19 declaration under Section 56 4(b)(1) of the Act, 21 U.S.C. section 360bbb-3(b)(1), unless the authorization is terminated or revoked sooner. Performed at [facility], [address], [place], [place] [address]     Studies/Results: Dg Chest 2 View  Result Date: 11/30/2018 CLINICAL DATA:  Chest pain and sickle cell. EXAM: CHEST - 2 VIEW COMPARISON:  November 20, 2018 FINDINGS: The cardiomediastinal silhouette is normal. No pneumothorax. No focal infiltrates. The nodular density seen on the lateral view of the previous study is no longer visualized. No acute abnormalities. IMPRESSION: No active cardiopulmonary disease. Electronically Signed   By: Gerome Sam III M.D   On: 11/30/2018 19:30    Medications: Scheduled Meds: . cholecalciferol  2,000 Units Oral Daily  . enoxaparin (LOVENOX) injection  30 mg Subcutaneous Daily  . feeding supplement (ENSURE ENLIVE)  237 mL Oral BID BM  . folic acid  1 mg Oral Daily  . multivitamin with minerals  1 tablet Oral Daily  . oxyCODONE  15 mg Oral Q12H  . sodium bicarbonate  650 mg Oral BID  . tacrolimus  2 mg Oral BID   Continuous Infusions: . dextrose 5 % and 0.45% NaCl 75 mL/hr at 12/01/18 0827   PRN Meds:.acetaminophen **OR** acetaminophen, nortriptyline, ondansetron, Oxycodone  HCl  Assessment/Plan: Active Problems:   CKD (chronic kidney disease), stage IV (HCC)   Sickle cell anemia with crisis (HCC)   Polysubstance abuse (HCC)   Sickle cell crisis (HCC)   Immune-complex glomerulonephritis  1. Hb Sickle Cell Disease with crisis: Continue IVF D5 .45% Saline @ 75 mls/hour for gentle hydration, initially patient was placed on Dilaudid via PCA, will discontinue as patient's urine drug screen came back positive for cocaine, IV Toradol contraindicated due to CKD, oral pain medications, monitor vitals very closely, Re-evaluate pain scale regularly, 2 L of Oxygen by [place]. 2. CKD stage IV: Stable.  Patient started on Prograf by her nephrologist, continue.  Monitor closely.  Avoid all nephrotoxins. 3. Polysubstance use disorder: Patient tested positive for cocaine, will discontinue Dilaudid IV.  Patient counseled extensively again 4. Sickle Cell Anemia: Hemoglobin is stable slightly above baseline.  No clinical indication for blood transfusion today.  Will monitor closely 5. Chronic pain Syndrome: Restart home pain medications.  Code Status: Full Code Family Communication: N/A Disposition Plan: Not yet ready for discharge  Mulan Adan  If 7PM-7AM, please contact night-coverage.  12/01/2018, 4:59 PM  LOS:  1 day

## 2018-12-01 NOTE — Progress Notes (Signed)
Rx Brief note: Hydrea  Per [place] policy, Hydroxyurea is held when any of the following occurs:  Hydroxyurea (Hydrea) hold criteria:  ANC < 2  Pltc < 80K in sickle-cell patients; < 100K in other patients  Hgb <= 6 in sickle-cell patients; < 8 in other patients  Reticulocytes < 80K when Hgb < 9  10/17:  ANC = 1.7 and Hgb = 8.5 with retics = 49.9.  Hydroxyurea has been held (d/c'd from the profile) per policy.   Thanks Dorrene German 12/01/2018  12:25 AM

## 2018-12-01 NOTE — Progress Notes (Signed)
Patient made aware about discontinuing his Dilaudid PCA , patients was upset and asked a lot of questions regarding discontinuing the medicine, explained and educated about doctors order and other mode of pain control medicine. We will continue to monitor.

## 2018-12-01 NOTE — Progress Notes (Signed)
Patient was educated on using the incentive spirometer in which he stated " It's a waste of time, I am not doing it" I explained to him the benefits and consequences of not using it and he stated " I will take a chance cause I am not using it".

## 2018-12-01 NOTE — Progress Notes (Signed)
Patient was educated on SCD and that they were ordered to prevent blood clots and being in the hospital he was at risk. Pt said he was moving in the bed. Pt was encouraged to move, but he refuses to wear the scd's.

## 2018-12-02 LAB — BASIC METABOLIC PANEL
Anion gap: 9 (ref 5–15)
BUN: 18 mg/dL (ref 6–20)
CO2: 14 mmol/L — ABNORMAL LOW (ref 22–32)
Calcium: 8.9 mg/dL (ref 8.9–10.3)
Chloride: 111 mmol/L (ref 98–111)
Creatinine, Ser: 2.29 mg/dL — ABNORMAL HIGH (ref 0.61–1.24)
GFR calc Af Amer: 41 mL/min — ABNORMAL LOW (ref >60)
GFR calc non Af Amer: 36 mL/min — ABNORMAL LOW (ref >60)
Glucose, Bld: 100 mg/dL — ABNORMAL HIGH (ref 70–99)
Potassium: 6.3 mmol/L (ref 3.5–5.1)
Sodium: 134 mmol/L — ABNORMAL LOW (ref 135–145)

## 2018-12-02 LAB — CBC WITH DIFFERENTIAL/PLATELET
Abs Immature Granulocytes: 0.03 10*3/uL (ref 0.00–0.07)
Basophils Absolute: 0 10*3/uL (ref 0.0–0.1)
Basophils Relative: 1 %
Eosinophils Absolute: 0.1 10*3/uL (ref 0.0–0.5)
Eosinophils Relative: 1 %
HCT: 22.6 % — ABNORMAL LOW (ref 39.0–52.0)
Hemoglobin: 7.9 g/dL — ABNORMAL LOW (ref 13.0–17.0)
Immature Granulocytes: 1 %
Lymphocytes Relative: 62 %
Lymphs Abs: 2.7 10*3/uL (ref 0.7–4.0)
MCH: 36.6 pg — ABNORMAL HIGH (ref 26.0–34.0)
MCHC: 35 g/dL (ref 30.0–36.0)
MCV: 104.6 fL — ABNORMAL HIGH (ref 80.0–100.0)
Monocytes Absolute: 0.3 10*3/uL (ref 0.1–1.0)
Monocytes Relative: 6 %
Neutro Abs: 1.3 10*3/uL — ABNORMAL LOW (ref 1.7–7.7)
Neutrophils Relative %: 29 %
Platelets: 281 10*3/uL (ref 150–400)
RBC: 2.16 MIL/uL — ABNORMAL LOW (ref 4.22–5.81)
WBC: 4.7 10*3/uL (ref 4.0–10.5)
nRBC: 0.4 % — ABNORMAL HIGH (ref 0.0–0.2)

## 2018-12-02 LAB — POTASSIUM: Potassium: 4.9 mmol/L (ref 3.5–5.1)

## 2018-12-02 MED ORDER — HYDROMORPHONE HCL 1 MG/ML IJ SOLN
2.0000 mg | INTRAMUSCULAR | Status: DC | PRN
  Administered 2018-12-02 (×2): 2 mg via SUBCUTANEOUS
  Filled 2018-12-02 (×2): qty 2

## 2018-12-02 MED ORDER — BOOST / RESOURCE BREEZE PO LIQD CUSTOM
1.0000 | Freq: Three times a day (TID) | ORAL | Status: DC
  Administered 2018-12-02: 1 via ORAL

## 2018-12-02 NOTE — TOC Progression Note (Signed)
Transition of Care (TOC) - Progression Note    Patient Details  Name: [name] [name]. MRN: [id number] Date of Birth: [birth year]  Transition of Care [facility]) CM/SW Contact  Leeroy Cha, RN Phone Number: 12/02/2018, 10:52 AM  Clinical Narrative:    Discharge Readiness Return to top of Sickle Cell Disease RRG - [place]  Discharge readiness is indicated by patient meeting Recovery Milestones, including ALL of the following:  Hemodynamic stability  yes Tachypnea absent  yes Hypoxemia absent  yes Acute peripheral ischemia absent  yes Infection absent or adequately treated  yes Mental status at baseline  yes New neurologic deficits absent  yes Pain absent or managed  No switched to po pain meds am of 101920 Ambulatory or acceptable for next level of care yes Oral hydration, medications, and diet yes Possible dc within 24 hours if pain control met.         Expected Discharge Plan and Services                                                 Social Determinants of Health (SDOH) Interventions    Readmission Risk Interventions Readmission Risk Prevention Plan 09/24/2018 08/12/2018 05/07/2018  Transportation Screening Complete - Complete  PCP or Specialist Appt within 3-5 Days - - Not Complete  Not Complete comments - - Closer to [place] or [place] - - Not Complete  HRI or Home Care Consult comments - - NA  Social Work Consult for [place] Planning/Counseling - - Not Complete  SW consult not completed comments - - NA  Palliative Care Screening - - Not Applicable  Medication Review Press photographer) Complete Complete Complete  PCP or Specialist appointment within 3-5 days of discharge Not Complete Complete -  PCP/Specialist Appt Not Complete comments Not ready for dc - -  HRI or Home Care Consult Not Complete Complete -  HRI or Home Care Consult Pt Refusal Comments NA - -  SW Recovery Care/Counseling Consult Not Complete Complete -  SW  Consult Not Complete Comments NA - -  Palliative Care Screening Not Applicable Not Applicable -  [place] Not Applicable Not Applicable -  Some recent data might be hidden

## 2018-12-02 NOTE — Progress Notes (Signed)
CRITICAL VALUE ALERT  Critical Value:   Potassium 6.3  Date & Time Notied:  12/02/18@1105   Provider Notified:China Smith Robert MD  Orders Received/Actions taken: coming to see patient

## 2018-12-02 NOTE — BH Specialist Note (Signed)
Integrated Behavioral Health Note  Reason for Referral: [name] [name]. is a 38 y.o. male  Pt was referred by clinic for: substance use  Pt reports the following concerns: transportation  Plan: 1. Addressed today: Following patient peripherally since referral for substance use in July. Met with patient at bedside in [facility] today. Patient acknowledges positive UDS this admission, though denies substance use in the past few months.  Patient reports needing transportation assistance to upcoming appointments for hematology and orthopedics. Coordinated with social worker at [facility] for assistance with transportation to these appointments.   2. Referral: [facility] for transportation needs  3. Follow up: from clinic as needed  Estanislado Emms, [facility] [phone]

## 2018-12-02 NOTE — Progress Notes (Signed)
Initial Nutrition Assessment  INTERVENTION:   -D/c Ensure supplements -Boost Breeze po TID, each supplement provides 250 kcal and 9 grams of protein  NUTRITION DIAGNOSIS:   Inadequate oral intake related to poor appetite as evidenced by meal completion < 25%.  GOAL:   Patient will meet greater than or equal to 90% of their needs  MONITOR:   PO intake, Supplement acceptance, Labs, Weight trends, I & O's  REASON FOR ASSESSMENT:   Malnutrition Screening Tool    ASSESSMENT:   38 year old male with a medical history significant for sickle cell disease, type SS, chronic pain syndrome, opiate dependence, history of polysubstance abuse, history of sickle cell nephropathy, CKD stage III, and anemia of chronic disease who was admitted yesterday for sickle cell pain crisis.  **RD working remotely**  Patient currently not eating well, meal completions documented as 0%. Pt is not drinking Ensure supplements either. Will trial Boost Breeze supplements instead.   Per weight records, pt with no significant weight loss. I/Os: +943 ml UOP: 2225 ml x 24 hours  Medications: Vitamin D tablet, Folic acid tablet, Multivitamin with minerals daily UDS+ for opiates, cocaine, THC Labs reviewed: Low Na GFR: 41  NUTRITION - FOCUSED PHYSICAL EXAM:  Unable to perform -working remotely.  Diet Order:   Diet Order            Diet Heart Room service appropriate? Yes; Fluid consistency: Thin  Diet effective now              EDUCATION NEEDS:   No education needs have been identified at this time  Skin:  Skin Assessment: Reviewed RN Assessment  Last BM:  10/17  Height:   Ht Readings from Last 1 Encounters:  11/26/18 5\' 3"  (1.6 m)    Weight:   Wt Readings from Last 1 Encounters:  11/26/18 58.5 kg    Ideal Body Weight:  56.3 kg  BMI:  22.7 kg/m^2  Estimated Nutritional Needs:   Kcal:  1450-1650  Protein:  70-80g  Fluid:  1.6L/day  Clayton Bibles, MS, RD, LDN Inpatient  Clinical Dietitian Pager: [phone] After Hours Pager: [phone]

## 2018-12-03 NOTE — Progress Notes (Signed)
Discharge instructions discussed with patient, verbalized agreement and understanding 

## 2018-12-03 NOTE — Discharge Summary (Signed)
Physician Discharge Summary  [name] [name]. ID:[id number] DOB: [birth year] DOA: 11/30/2018  PCP: [name], MD  Admit date: 11/30/2018  Discharge date: 12/03/2018  Discharge Diagnoses:  Active Problems:   CKD (chronic kidney disease), stage IV (HCC)   Sickle cell anemia with crisis ([place])   Polysubstance abuse ([place])   Sickle cell crisis ([place])   Immune-complex glomerulonephritis   Discharge Condition: Stable  Disposition:  Follow-up Information    [name], MD .   Specialty: Internal Medicine Contact information: [place] [address] [phone]        Buford Dresser, MD .   Specialty: Cardiology Contact information: [address] [place] [address] [place], Daphne, FNP Follow up in 2 week(s).   Specialty: Family Medicine Contact information: [place]. [place] [address] [phone]          Pt is discharged home in good condition and is to follow up with Cammie Sickle, FNP to have labs evaluated. [place] A Coyle Jr. is instructed to increase activity slowly and balance with rest for the next few days, and use prescribed medication to complete treatment of pain  Diet: Regular Wt Readings from Last 3 Encounters:  11/26/18 58.5 kg  11/20/18 55.7 kg  11/02/18 59.9 kg    History of present illness:  [name] [name], a 38 year old male with a medical history significant for sickle cell disease type SS, sickle cell nephropathy, stage IV chronic kidney disease, history of polysubstance abuse, and history of anemia of chronic disease presented to ER complaining of pain.  Patient also stated that he had no appetite 2 days prior because he is just not been feeling well.  Patient endorsed nausea and vomiting.  He also woke up with pain that was consistent with his typical sickle cell crisis.  Patient states that his chest was hurting at that time.   Patient says that pain is generalized all throughout his body.  He took home medications without sustained relief.  Pain intensity 9/10.  Patient denies fever, but reported chills.  Endorsed a slight cough and shortness of breath.  ER course: WBCs 8.1, hemoglobin 6.6, platelets 447.  Creatinine 2.59, AST 57, ALT 75, total bilirubin 1.2.  Patient transfused 1 unit of PRBCs in ER.  Troponin negative.  Uric acid 8.4. Pain persists despite several doses of IV Dilaudid, IV fluids.  Patient admitted to telemetry for management of sickle cell pain crisis.   Hospital Course:  Sickle cell disease with pain crisis: Patient was admitted for sickle cell pain crisis and managed appropriately with IVF, IV Dilaudid via PCA as well as other adjunct therapies per sickle cell pain management protocols.  On review of patient's urine drug screen, it was found to be positive for cocaine.  All IV pain medications were discontinued at that time.  Patient was transitioned to home opiate medications.  Pain intensity decreased to 4/10.  Patient requesting discharge home.  Cocaine use disorder: Discussed cocaine use with patient at length.  He states that he has no idea how cocaine got into his system.  He says that it is been over a month since he last used cocaine.  He endorses smoking marijuana.  Patient is requesting prescriptions for opiate medications.  Will address further in primary care.  Patient warrants more extensive drug testing at that time.  Advised to schedule first available follow-up.   Sickle  cell nephropathy/stage IV chronic kidney disease: Creatinine is 2.29, which is consistent with patient's baseline.  Prograf was continued throughout admission without interruption.  Patient is scheduled to follow-up with nephrology.  Advised to keep that appointment.  Anemia of chronic disease: On admission, patient's hemoglobin was 6.6 which is below his baseline.  Patient was transfused 1 unit of packed red blood  cells.  Hemoglobin improved to 7.9.  Will repeat CBC at scheduled follow-up appointment.  Patient is alert, oriented, and ambulating without assistance.  He is afebrile and maintaining oxygen saturation above 90% on room air. Patient was discharged home today in a hemodynamically stable condition.   Discharge Exam: Vitals:   12/02/18 2133 12/03/18 0620  BP: 110/71 94/63  Pulse: 91 79  Resp: 16 18  Temp: 98.8 F (37.1 C) 98.6 F (37 C)  SpO2: 99% 99%   Vitals:   12/02/18 0146 12/02/18 1355 12/02/18 2133 12/03/18 0620  BP: 99/62 115/82 110/71 94/63  Pulse: 79 80 91 79  Resp: 18  16 18   Temp: 98.7 F (37.1 C) 98.2 F (36.8 C) 98.8 F (37.1 C) 98.6 F (37 C)  TempSrc:  Oral Oral Oral  SpO2: 98% 100% 99% 99%    General appearance : Awake, alert, not in any distress. Speech Clear. Not toxic looking HEENT: Atraumatic and Normocephalic, pupils equally reactive to light and accomodation Neck: Supple, no JVD. No cervical lymphadenopathy.  Chest: Good air entry bilaterally, no added sounds  CVS: S1 S2 regular, no murmurs.  Abdomen: Bowel sounds present, Non tender and not distended with no gaurding, rigidity or rebound. Extremities: B/L Lower Ext shows no edema, both legs are warm to touch Neurology: Awake alert, and oriented X 3, CN II-XII intact, Non focal Skin: No Rash  Discharge Instructions  Discharge Instructions    Discharge patient   Complete by: As directed    Discharge disposition: 01-Home or Self Care   Discharge patient date: 12/03/2018     Allergies as of 12/03/2018      Reactions   Nsaids Other (See Comments)   Kidney disease   Morphine And Related Other (See Comments)   "Real bad headaches"       Medication List    TAKE these medications   cholecalciferol 25 MCG (1000 UT) tablet Commonly known as: VITAMIN D3 Take 2,000 Units by mouth daily.   folic acid 1 MG tablet Commonly known as: FOLVITE Take 1 tablet (1 mg total) daily by mouth.    hydroxyurea 500 MG capsule Commonly known as: HYDREA TAKE 3 CAPSULES BY MOUTH DAILY What changed:   how much to take  how to take this  when to take this  additional instructions   nortriptyline 25 MG capsule Commonly known as: PAMELOR Take 1 capsule (25 mg total) by mouth at bedtime as needed for sleep.   oxyCODONE 15 mg 12 hr tablet Commonly known as: OxyCONTIN Take 1 tablet (15 mg total) by mouth every 12 (twelve) hours. What changed: Another medication with the same name was changed. Make sure you understand how and when to take each.   Oxycodone HCl 10 MG Tabs Take 1 tablet (10 mg total) by mouth every 6 (six) hours as needed. What changed: reasons to take this   sodium bicarbonate 650 MG tablet Take 650 mg by mouth 2 (two) times daily.   tacrolimus 1 MG capsule Commonly known as: PROGRAF Take 2 mg by mouth 2 (two) times daily.       The  results of significant diagnostics from this hospitalization (including imaging, microbiology, ancillary and laboratory) are listed below for reference.    Significant Diagnostic Studies: Dg Chest 2 View  Result Date: 11/30/2018 CLINICAL DATA:  Chest pain and sickle cell. EXAM: CHEST - 2 VIEW COMPARISON:  November 20, 2018 FINDINGS: The cardiomediastinal silhouette is normal. No pneumothorax. No focal infiltrates. The nodular density seen on the lateral view of the previous study is no longer visualized. No acute abnormalities. IMPRESSION: No active cardiopulmonary disease. Electronically Signed   By: Dorise Bullion III M.D   On: 11/30/2018 19:30   Dg Chest 2 View  Result Date: 11/20/2018 CLINICAL DATA:  Chest pain which shortness-of-breath and dry cough since yesterday. EXAM: CHEST - 2 VIEW COMPARISON:  07/22/2018 and 10/16/2016 FINDINGS: Lungs are adequately inflated with chronic stable hazy prominence of the perihilar vessels. No lobar consolidation or effusion. Small nodule opacity over the lateral left base unchanged. Subtle  nodular opacity over the anterior lung bases on the lateral film not well localized on the frontal film. Flattening of the hemidiaphragms on the lateral film. Cardiomediastinal silhouette and remainder of the exam is unchanged. IMPRESSION: No acute cardiopulmonary disease. Subtle nodular density over the anterior lung bases on the lateral film. Recommend follow-up chest radiograph 3-4 weeks versus noncontrast chest CT for further evaluation. Electronically Signed   By: Marin Olp M.D.   On: 11/20/2018 12:58   Dg Foot 2 Views Left  Result Date: 11/20/2018 CLINICAL DATA:  Possible sickle cell crisis EXAM: LEFT FOOT - 2 VIEW COMPARISON:  11/02/2018 FINDINGS: Increased sclerosis is again noted in the first metatarsal with arthritic changes of the first MTP joint. No acute fracture or dislocation is noted. No soft tissue abnormality is seen. IMPRESSION: Chronic changes in the first metatarsal stable from the prior exam. No acute abnormality noted. Electronically Signed   By: Inez Catalina M.D.   On: 11/20/2018 21:46    Microbiology: Recent Results (from the past 240 hour(s))  SARS CORONAVIRUS 2 (TAT 6-24 HRS) Nasopharyngeal Nasopharyngeal Swab     Status: None   Collection Time: 11/30/18 10:08 PM   Specimen: Nasopharyngeal Swab  Result Value Ref Range Status   SARS Coronavirus 2 NEGATIVE NEGATIVE Final    Comment: (NOTE) SARS-CoV-2 target nucleic acids are NOT DETECTED. The SARS-CoV-2 RNA is generally detectable in upper and lower respiratory specimens during the acute phase of infection. Negative results do not preclude SARS-CoV-2 infection, do not rule out co-infections with other pathogens, and should not be used as the sole basis for treatment or other patient management decisions. Negative results must be combined with clinical observations, patient history, and epidemiological information. The expected result is Negative. Fact Sheet for  Patients: SugarRoll.be Fact Sheet for Healthcare Providers: https://www.woods-mathews.com/ This test is not yet approved or cleared by the [place] FDA and  has been authorized for detection and/or diagnosis of SARS-CoV-2 by FDA under an Emergency Use Authorization (EUA). This EUA will remain  in effect (meaning this test can be used) for the duration of the COVID-19 declaration under Section 56 4(b)(1) of the Act, 21 U.S.C. section 360bbb-3(b)(1), unless the authorization is terminated or revoked sooner. Performed at [facility], [place] [address] [place], [place] [address]      Labs: Basic Metabolic Panel: Recent Labs  Lab 11/30/18 2022 12/01/18 0654 12/02/18 1016 12/02/18 1146  NA 133* 133* 134*  --   K 4.2 4.2 6.3* 4.9  CL 107 109 111  --  CO2 17* 15* 14*  --   GLUCOSE 94 100* 100*  --   BUN 24* 23* 18  --   CREATININE 2.39* 2.75* 2.29*  --   CALCIUM 8.7* 8.6* 8.9  --    Liver Function Tests: Recent Labs  Lab 11/30/18 2022 12/01/18 0654  AST 83* 115*  ALT 51* 61*  ALKPHOS 229* 240*  BILITOT 1.2 1.2  PROT 7.0 6.0*  ALBUMIN 3.1* 2.7*   No results for input(s): LIPASE, AMYLASE in the last 168 hours. No results for input(s): AMMONIA in the last 168 hours. CBC: Recent Labs  Lab 11/30/18 2022 12/01/18 0654 12/02/18 1016  WBC 4.3 3.8* 4.7  NEUTROABS 1.7  --  1.3*  HGB 8.5* 8.2* 7.9*  HCT 25.1* 24.0* 22.6*  MCV 108.2* 105.7* 104.6*  PLT 378 271 281   Cardiac Enzymes: No results for input(s): CKTOTAL, CKMB, CKMBINDEX, TROPONINI in the last 168 hours. BNP: Invalid input(s): POCBNP CBG: No results for input(s): GLUCAP in the last 168 hours.  Time coordinating discharge: 50 minutes  Signed:  [name]  APRN, MSN, FNP-C Patient [place] [address] [place],  [address] [phone]  [facility] 12/03/2018, 8:14 AM

## 2018-12-03 NOTE — Care Management Important Message (Signed)
Important Message  Patient Details IM Letter given to Velva Harman RN to present to the Patient Name: [name] [name]. MRN: [id number] Date of Birth: 1980/05/24   Medicare Important Message Given:  Yes     Kerin Salen 12/03/2018, 9:54 AM

## 2018-12-03 NOTE — Progress Notes (Signed)
Subjective: [name] [name] a 38 year old male with a medical history significant for sickle cell disease, type SS, chronic pain syndrome, opiate dependence, history of polysubstance abuse, history of sickle cell nephropathy, CKD stage III, and anemia of chronic disease was admitted for sickle cell pain crisis.  Patient continues to complain of pain all over on today.  Pain rated at 7-8/10.  Patient's UDS was positive for cocaine.  All IV medications discontinued on 12/01/2018.  Patient transition to oral medications for pain management.  He states that oral medications have been ineffective in treating pain.  He characterizes pain as constant, throbbing, and occasionally sharp.  Patient denies fever, headache, chest pain, persistent cough, shortness of breath, dysuria, nausea, vomiting, or diarrhea. Objective:  Vital signs in last 24 hours:  Vitals:   12/01/18 2057 12/02/18 0146 12/02/18 1355 12/02/18 2133  BP: 112/67 99/62 115/82 110/71  Pulse: 82 79 80 91  Resp: 18 18  16   Temp:  98.7 F (37.1 C) 98.2 F (36.8 C) 98.8 F (37.1 C)  TempSrc: Oral  Oral Oral  SpO2: 100% 98% 100% 99%    Intake/Output from previous day:   Intake/Output Summary (Last 24 hours) at 12/03/2018 0553 Last data filed at 12/03/2018 0402 Gross per 24 hour  Intake 2101.84 ml  Output 2100 ml  Net 1.84 ml    Physical Exam: General: Alert, awake, oriented x3, in no acute distress.  HEENT: [place]/AT PEERL, EOMI Neck: Trachea midline,  no masses, no thyromegal,y no JVD, no carotid bruit OROPHARYNX:  Moist, No exudate/ erythema/lesions.  Heart: Regular rate and rhythm, without murmurs, rubs, gallops, PMI non-displaced, no heaves or thrills on palpation.  Lungs: Clear to auscultation, no wheezing or rhonchi noted. No increased vocal fremitus resonant to percussion  Abdomen: Soft, nontender, nondistended, positive bowel sounds, no masses no hepatosplenomegaly noted..  Neuro: No focal neurological deficits noted  cranial nerves II through XII grossly intact. DTRs 2+ bilaterally upper and lower extremities. Strength 5 out of 5 in bilateral upper and lower extremities. Musculoskeletal: No warm swelling or erythema around joints, no spinal tenderness noted. Psychiatric: Patient alert and oriented x3, good insight and cognition, good recent to remote recall. Lymph node survey: No cervical axillary or inguinal lymphadenopathy noted.  Lab Results:  Basic Metabolic Panel:    Component Value Date/Time   NA 134 (L) 12/02/2018 1016   NA 142 10/01/2018 1430   K 4.9 12/02/2018 1146   CL 111 12/02/2018 1016   CO2 14 (L) 12/02/2018 1016   BUN 18 12/02/2018 1016   BUN 42 (H) 10/01/2018 1430   CREATININE 2.29 (H) 12/02/2018 1016   CREATININE 1.45 (H) 05/12/2014 1430   GLUCOSE 100 (H) 12/02/2018 1016   CALCIUM 8.9 12/02/2018 1016   CBC:    Component Value Date/Time   WBC 4.7 12/02/2018 1016   HGB 7.9 (L) 12/02/2018 1016   HGB 7.4 (L) 10/01/2018 1430   HCT 22.6 (L) 12/02/2018 1016   HCT 22.6 (L) 10/01/2018 1430   PLT 281 12/02/2018 1016   PLT 391 10/01/2018 1430   MCV 104.6 (H) 12/02/2018 1016   MCV 102 (H) 10/01/2018 1430   NEUTROABS 1.3 (L) 12/02/2018 1016   NEUTROABS 13.1 (H) 10/01/2018 1430   LYMPHSABS 2.7 12/02/2018 1016   LYMPHSABS 3.7 (H) 10/01/2018 1430   MONOABS 0.3 12/02/2018 1016   EOSABS 0.1 12/02/2018 1016   EOSABS 0.0 10/01/2018 1430   BASOSABS 0.0 12/02/2018 1016   BASOSABS 0.0 10/01/2018 1430    Recent Results (  from the past 240 hour(s))  SARS CORONAVIRUS 2 (TAT 6-24 HRS) Nasopharyngeal Nasopharyngeal Swab     Status: None   Collection Time: 11/30/18 10:08 PM   Specimen: Nasopharyngeal Swab  Result Value Ref Range Status   SARS Coronavirus 2 NEGATIVE NEGATIVE Final    Comment: (NOTE) SARS-CoV-2 target nucleic acids are NOT DETECTED. The SARS-CoV-2 RNA is generally detectable in upper and lower respiratory specimens during the acute phase of infection. Negative results do  not preclude SARS-CoV-2 infection, do not rule out co-infections with other pathogens, and should not be used as the sole basis for treatment or other patient management decisions. Negative results must be combined with clinical observations, patient history, and epidemiological information. The expected result is Negative. Fact Sheet for Patients: SugarRoll.be Fact Sheet for Healthcare Providers: https://www.woods-mathews.com/ This test is not yet approved or cleared by the [place] FDA and  has been authorized for detection and/or diagnosis of SARS-CoV-2 by FDA under an Emergency Use Authorization (EUA). This EUA will remain  in effect (meaning this test can be used) for the duration of the COVID-19 declaration under Section 56 4(b)(1) of the Act, 21 U.S.C. section 360bbb-3(b)(1), unless the authorization is terminated or revoked sooner. Performed at [facility], [place] [address], [place], [place] [address]     Studies/Results: No results found.  Medications: Scheduled Meds: . cholecalciferol  2,000 Units Oral Daily  . enoxaparin (LOVENOX) injection  30 mg Subcutaneous Daily  . feeding supplement  1 Container Oral TID BM  . folic acid  1 mg Oral Daily  . multivitamin with minerals  1 tablet Oral Daily  . oxyCODONE  15 mg Oral Q12H  . sodium bicarbonate  650 mg Oral BID  . tacrolimus  2 mg Oral BID   Continuous Infusions: . dextrose 5 % and 0.45% NaCl 75 mL/hr at 12/02/18 1700   PRN Meds:.acetaminophen **OR** acetaminophen, HYDROmorphone (DILAUDID) injection, nortriptyline, ondansetron, oxyCODONE  Consultants:  None  Procedures:  None  Antibiotics:  None  Assessment/Plan: Active Problems:   CKD (chronic kidney disease), stage IV (HCC)   Sickle cell anemia with crisis (HCC)   Polysubstance abuse (HCC)   Sickle cell crisis (HCC)   Immune-complex glomerulonephritis  Sickle cell disease with pain crisis: IV  fluids continued at 75 mL/h Oxycodone 10 mg every 6 hours as needed OxyContin 15 mg every 12 hours IV Toradol contraindicated due to CKD. Monitor vital signs very closely. Maintain oxygen saturation above 90%, supplemental oxygen as needed  CKD stage IV: Stable.  Patient continued on Prograf per nephrologist.  Monitor closely.  Avoid all nephrotoxins.  Polysubstance abuse disorder: UDS positive for cocaine.  Patient counseled at length.  LCSW consult.  Refrain from IV opiates.  Sickle cell anemia: Hemoglobin is stable.  No clinical indication for blood transfusion.  Continue to monitor closely.  CBC in a.m.  Chronic pain syndrome: Continue home medications.  Code Status: Full Code Family Communication: N/A Disposition Plan: Not yet ready for discharge  [place], MSN, FNP-C Patient [place] [address] [place],  [address] [phone]  If 5PM-7AM, please contact night-coverage.  12/03/2018, 5:53 AM  LOS: 3 days

## 2018-12-03 NOTE — Discharge Instructions (Signed)
Chronic Kidney Disease, Adult °Chronic kidney disease (CKD) happens when the kidneys are damaged over a long period of time. The kidneys are two organs that help with: °· Getting rid of waste and extra fluid from the blood. °· Making hormones that maintain the amount of fluid in your tissues and blood vessels. °· Making sure that the body has the right amount of fluids and chemicals. °Most of the time, CKD does not go away, but it can usually be controlled. Steps must be taken to slow down the kidney damage or to stop it from getting worse. If this is not done, the kidneys may stop working. °Follow these instructions at home: °Medicines °· Take over-the-counter and prescription medicines only as told by your doctor. You may need to change the amount of medicines you take. °· Do not take any new medicines unless your doctor says it is okay. Many medicines can make your kidney damage worse. °· Do not take any vitamin and supplements unless your doctor says it is okay. Many vitamins and supplements can make your kidney damage worse. °General instructions °· Follow a diet as told by your doctor. You may need to stay away from: °? Alcohol. °? Salty foods. °? Foods that are high in: °§ Potassium. °§ Calcium. °§ Protein. °· Do not use any products that contain nicotine or tobacco, such as cigarettes and e-cigarettes. If you need help quitting, ask your doctor. °· Keep track of your blood pressure at home. Tell your doctor about any changes. °· If you have diabetes, keep track of your blood sugar as told by your doctor. °· Try to stay at a healthy weight. If you need help, ask your doctor. °· Exercise at least 30 minutes a day, 5 days a week. °· Stay up-to-date with your shots (immunizations) as told by your doctor. °· Keep all follow-up visits as told by your doctor. This is important. °Contact a doctor if: °· Your symptoms get worse. °· You have new symptoms. °Get help right away if: °· You have symptoms of end-stage  kidney disease. These may include: °? Headaches. °? Numbness in your hands or feet. °? Easy bruising. °? Having hiccups often. °? Chest pain. °? Shortness of breath. °? Stopping of menstrual periods in women. °· You have a fever. °· You have very little pee (urine). °· You have pain or bleeding when you pee. °Summary °· Chronic kidney disease (CKD) happens when the kidneys are damaged over a long period of time. °· Most of the time, this condition does not go away, but it can usually be controlled. Steps must be taken to slow down the kidney damage or to stop it from getting worse. °· Treatment may include a combination of medicines and lifestyle changes. °This information is not intended to replace advice given to you by your health care provider. Make sure you discuss any questions you have with your health care provider. °Document Released: 04/26/2009 Document Revised: 01/12/2017 Document Reviewed: 03/06/2016 °Elsevier Patient Education © 2020 [employer] ° °Sickle Cell Anemia, Adult ° °Sickle cell anemia is a condition where your red blood cells are shaped like sickles. Red blood cells carry oxygen through the body. Sickle-shaped cells do not live as long as normal red blood cells. They also clump together and block blood from flowing through the blood vessels. This prevents the body from getting enough oxygen. Sickle cell anemia causes organ damage and pain. It also increases the risk of infection. °Follow these instructions at home: °  Medicines °· Take over-the-counter and prescription medicines only as told by your doctor. °· If you were prescribed an antibiotic medicine, take it as told by your doctor. Do not stop taking the antibiotic even if you start to feel better. °· If you develop a fever, do not take medicines to lower the fever right away. Tell your doctor about the fever. °Managing pain, stiffness, and swelling °· Try these methods to help with pain: °? Use a heating pad. °? Take a warm  bath. °? Distract yourself, such as by watching TV. °Eating and drinking °· Drink enough fluid to keep your pee (urine) clear or pale yellow. Drink more in hot weather and during exercise. °· Limit or avoid alcohol. °· Eat a healthy diet. Eat plenty of fruits, vegetables, whole grains, and lean protein. °· Take vitamins and supplements as told by your doctor. °Traveling °· When traveling, keep these with you: °? Your medical information. °? The names of your doctors. °? Your medicines. °· If you need to take an airplane, talk to your doctor first. °Activity °· Rest often. °· Avoid exercises that make your heart beat much faster, such as jogging. °General instructions °· Do not use products that have nicotine or tobacco, such as cigarettes and e-cigarettes. If you need help quitting, ask your doctor. °· Consider wearing a medical alert bracelet. °· Avoid being in high places (high altitudes), such as mountains. °· Avoid very hot or cold temperatures. °· Avoid places where the temperature changes a lot. °· Keep all follow-up visits as told by your doctor. This is important. °Contact a doctor if: °· A joint hurts. °· Your feet or hands hurt or swell. °· You feel tired (fatigued). °Get help right away if: °· You have symptoms of infection. These include: °? Fever. °? Chills. °? Being very tired. °? Irritability. °? Poor eating. °? Throwing up (vomiting). °· You feel dizzy or faint. °· You have new stomach pain, especially on the left side. °· You have a an erection (priapism) that lasts more than 4 hours. °· You have numbness in your arms or legs. °· You have a hard time moving your arms or legs. °· You have trouble talking. °· You have pain that does not go away when you take medicine. °· You are short of breath. °· You are breathing fast. °· You have a long-term cough. °· You have pain in your chest. °· You have a bad headache. °· You have a stiff neck. °· Your stomach looks bloated even though you did not eat  much. °· Your skin is pale. °· You suddenly cannot see well. °Summary °· Sickle cell anemia is a condition where your red blood cells are shaped like sickles. °· Follow your doctor's advice on ways to manage pain, food to eat, activities to do, and steps to take for safe travel. °· Get medical help right away if you have any signs of infection, such as a fever. °This information is not intended to replace advice given to you by your health care provider. Make sure you discuss any questions you have with your health care provider. °Document Released: 11/20/2012 Document Revised: 05/24/2018 Document Reviewed: 03/07/2016 °Elsevier Patient Education © 2020 Elsevier Inc. ° °

## 2018-12-10 ENCOUNTER — Ambulatory Visit: Payer: Medicare Other | Admitting: Orthopaedic Surgery

## 2018-12-10 ENCOUNTER — Ambulatory Visit (INDEPENDENT_AMBULATORY_CARE_PROVIDER_SITE_OTHER): Payer: Medicare Other | Admitting: Family Medicine

## 2018-12-10 ENCOUNTER — Other Ambulatory Visit: Payer: Self-pay

## 2018-12-10 ENCOUNTER — Encounter: Payer: Self-pay | Admitting: Family Medicine

## 2018-12-10 VITALS — BP 108/79 | HR 100 | Temp 97.9°F | Resp 16 | Ht 68.0 in | Wt 128.0 lb

## 2018-12-10 DIAGNOSIS — D571 Sickle-cell disease without crisis: Secondary | ICD-10-CM

## 2018-12-10 DIAGNOSIS — N08 Glomerular disorders in diseases classified elsewhere: Secondary | ICD-10-CM

## 2018-12-10 DIAGNOSIS — G894 Chronic pain syndrome: Secondary | ICD-10-CM

## 2018-12-10 LAB — POCT URINALYSIS DIPSTICK
Bilirubin, UA: NEGATIVE
Blood, UA: NEGATIVE
Glucose, UA: NEGATIVE
Ketones, UA: NEGATIVE
Leukocytes, UA: NEGATIVE
Nitrite, UA: NEGATIVE
Protein, UA: POSITIVE — AB
Spec Grav, UA: 1.015 (ref 1.010–1.025)
Urobilinogen, UA: 0.2 E.U./dL
pH, UA: 7 (ref 5.0–8.0)

## 2018-12-10 NOTE — Patient Instructions (Signed)
Sickle Cell Anemia, Adult ° °Sickle cell anemia is a condition where your red blood cells are shaped like sickles. Red blood cells carry oxygen through the body. Sickle-shaped cells do not live as long as normal red blood cells. They also clump together and block blood from flowing through the blood vessels. This prevents the body from getting enough oxygen. Sickle cell anemia causes organ damage and pain. It also increases the risk of infection. °Follow these instructions at home: °Medicines °· Take over-the-counter and prescription medicines only as told by your doctor. °· If you were prescribed an antibiotic medicine, take it as told by your doctor. Do not stop taking the antibiotic even if you start to feel better. °· If you develop a fever, do not take medicines to lower the fever right away. Tell your doctor about the fever. °Managing pain, stiffness, and swelling °· Try these methods to help with pain: °? Use a heating pad. °? Take a warm bath. °? Distract yourself, such as by watching TV. °Eating and drinking °· Drink enough fluid to keep your pee (urine) clear or pale yellow. Drink more in hot weather and during exercise. °· Limit or avoid alcohol. °· Eat a healthy diet. Eat plenty of fruits, vegetables, whole grains, and lean protein. °· Take vitamins and supplements as told by your doctor. °Traveling °· When traveling, keep these with you: °? Your medical information. °? The names of your doctors. °? Your medicines. °· If you need to take an airplane, talk to your doctor first. °Activity °· Rest often. °· Avoid exercises that make your heart beat much faster, such as jogging. °General instructions °· Do not use products that have nicotine or tobacco, such as cigarettes and e-cigarettes. If you need help quitting, ask your doctor. °· Consider wearing a medical alert bracelet. °· Avoid being in high places (high altitudes), such as mountains. °· Avoid very hot or cold temperatures. °· Avoid places where the  temperature changes a lot. °· Keep all follow-up visits as told by your doctor. This is important. °Contact a doctor if: °· A joint hurts. °· Your feet or hands hurt or swell. °· You feel tired (fatigued). °Get help right away if: °· You have symptoms of infection. These include: °? Fever. °? Chills. °? Being very tired. °? Irritability. °? Poor eating. °? Throwing up (vomiting). °· You feel dizzy or faint. °· You have new stomach pain, especially on the left side. °· You have a an erection (priapism) that lasts more than 4 hours. °· You have numbness in your arms or legs. °· You have a hard time moving your arms or legs. °· You have trouble talking. °· You have pain that does not go away when you take medicine. °· You are short of breath. °· You are breathing fast. °· You have a long-term cough. °· You have pain in your chest. °· You have a bad headache. °· You have a stiff neck. °· Your stomach looks bloated even though you did not eat much. °· Your skin is pale. °· You suddenly cannot see well. °Summary °· Sickle cell anemia is a condition where your red blood cells are shaped like sickles. °· Follow your doctor's advice on ways to manage pain, food to eat, activities to do, and steps to take for safe travel. °· Get medical help right away if you have any signs of infection, such as a fever. °This information is not intended to replace advice given to you by   your health care provider. Make sure you discuss any questions you have with your health care provider. °Document Released: 11/20/2012 Document Revised: 05/24/2018 Document Reviewed: 03/07/2016 °Elsevier Patient Education © 2020 [employer] ° °

## 2018-12-10 NOTE — Progress Notes (Signed)
Established Patient Office Visit  Subjective:  Patient ID: [name] [name]., male    DOB: 22-Jul-1980  Age: 38 y.o. MRN: [id number]  CC:  Chief Complaint  Patient presents with  . Sickle Cell Anemia  . Medication Refill    vitmain D, folic acid     HPI [name] [name]., a 38 year old male with a medical history significant for sickle cell disease, type SS, chronic pain syndrome, opiate dependence, history of polysubstance abuse, history of sickle cell nephropathy, CKD stage III, and anemia of chronic disease presents for post hospital follow-up and left foot pain.  Patient was recently hospitalized on 11/30/2018 for sickle cell pain crisis.   Pain is primarily to left foot.  Left foot pain worsened by weightbearing.  Pain improved prior to hospital discharge.  However, patient continues to have increased pain with prolonged standing and weightbearing primarily to left foot.  Patient underwent left foot x-ray previously.  X-ray showed no acute fracture or dislocation of the left foot.  There was sclerosis and severe distal arthritic changes of the left first metatarsal at the metatarsophalangeal joint; the first proximal phalanx was intact.   Current pain intensity is 7/10.  Patient has been mostly taking Tylenol, he has not been prescribed opiate medications due to previous drug screen being positive for cocaine during previous hospital admission.    Patient has a history of polysubstance abuse. Patient is requesting "pain shot" in clinic for pain management. He denies illicit drug use since hospital discharge. H does admit to marijuana use.   Patient has a history of sickle cell nephropathy.  He is followed by nephrology monthly.  Patient denies urinary frequency, urgency, dysuria, urinary retention, or low back pain. He also denies headache, dizziness, shortness of breath, dysuria, abdominal pain, nausea, vomiting, or diarrhea. Patient denies sick contacts, recent travel, or  exposure to COVID-19.  Past Medical History:  Diagnosis Date  . Abscess of right iliac fossa 09/24/2018   required Perc Drain 09/24/2018  . Arachnoid Cyst of brain bilaterally    "2 really small ones in the back of my head; inside; saw them w/MRI" (09/25/2012)  . Bacterial pneumonia ~ 2012   "caught it here in the hospital" (09/25/2012)  . Chronic kidney disease    "from my sickle cell" (09/25/2012)  . CKD (chronic kidney disease), stage II   . Colitis 04/19/2017   CT scan abd/ pelvis  . GERD (gastroesophageal reflux disease)    "after I eat alot of spicey foods" (09/25/2012)  . Gynecomastia, male 07/10/2012  . History of blood transfusion    "always related to sickle cell crisis" (09/25/2012)  . 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   Noted in [facility] from [facility]  . Migraines    "take RX qd to prevent them" (09/25/2012)  . Opioid dependence with withdrawal ([place]) 08/30/2012  . Renal insufficiency   . Sickle cell anemia (HCC)   . Sickle cell crisis ([place]) 09/25/2012  . Sickle cell nephropathy ([place]) 07/10/2012  . Sinus tachycardia   . Tachycardia with heart rate 121-140 beats per minute with ambulation 08/04/2016    Past Surgical History:  Procedure Laterality Date  . ABCESS DRAINAGE    . CHOLECYSTECTOMY  ~ 2012  . COLONOSCOPY N/A 04/23/2017   Procedure: COLONOSCOPY;  Surgeon: Irene Shipper, MD;  Location: [facility];  Service: Endoscopy;  Laterality: N/A;  . IR FLUORO GUIDE CV LINE RIGHT  12/17/2016  . IR RADIOLOGIST EVAL & MGMT  10/02/2018  . IR RADIOLOGIST EVAL & MGMT  10/15/2018  . IR REMOVAL TUN CV CATH W/O FL  12/21/2016  . IR US GUIDE VASC ACCESS RIGHT  12/17/2016  . spleenectomy      Family History  Problem Relation Age of Onset  . Breast cancer Mother     Social History   Socioeconomic History  . Marital status: Single    Spouse name: Not on file  . Number of children: Not on file  . Years of education: Not on file  . Highest  education level: Not on file  Occupational History  . Not on file  Social Needs  . Financial resource strain: Not on file  . Food insecurity    Worry: Not on file    Inability: Not on file  . Transportation needs    Medical: Not on file    Non-medical: Not on file  Tobacco Use  . Smoking status: Current Some Day Smoker    Packs/day: 0.10    Types: Cigarettes  . Smokeless tobacco: Never Used  . Tobacco comment: 09/25/2012 "I don't buy cigarettes; bum one from friends q now and then"  Substance and Sexual Activity  . Alcohol use: Yes    Alcohol/week: 0.0 standard drinks    Frequency: Never    Comment: Once a month  . Drug use: Not Currently    Types: Marijuana    Comment: Every 2-3 weeks   . Sexual activity: Yes  Lifestyle  . Physical activity    Days per week: Not on file    Minutes per session: Not on file  . Stress: Not on file  Relationships  . Social Herbalist on phone: Not on file    Gets together: Not on file    Attends religious service: Not on file    Active member of club or organization: Not on file    Attends meetings of clubs or organizations: Not on file    Relationship status: Not on file  . Intimate partner violence    Fear of current or ex partner: Not on file    Emotionally abused: Not on file    Physically abused: Not on file    Forced sexual activity: Not on file  Other Topics Concern  . Not on file  Social History Narrative    Lives alone in [place].   Back at school, studying Public relations account executive. Unemployed.    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     Outpatient Medications Prior to Visit  Medication Sig Dispense Refill  . folic acid (FOLVITE) 1 MG tablet Take 1 tablet (1 mg total) daily by mouth. 30 tablet 2  . hydroxyurea (HYDREA) 500 MG capsule TAKE 3 CAPSULES BY MOUTH DAILY  (Patient taking differently: Take 1,500 mg by mouth daily. ) 270 capsule 1  . nortriptyline (PAMELOR) 25 MG capsule Take 1 capsule (25 mg total) by mouth at bedtime as needed for sleep. 90 capsule 3  . oxyCODONE (OXYCONTIN) 15 mg 12 hr tablet Take 1 tablet (15 mg total) by mouth every 12 (twelve) hours. 60 tablet 0  . Oxycodone HCl 10 MG TABS Take 1 tablet (10 mg total) by mouth every 6 (six) hours as needed. (Patient taking differently: Take 10 mg by mouth every 6 (six) hours as needed (pain). ) 60 tablet  0  . sodium bicarbonate 650 MG tablet Take 650 mg by mouth 2 (two) times daily.    . tacrolimus (PROGRAF) 1 MG capsule Take 2 mg by mouth 2 (two) times daily.    . cholecalciferol (VITAMIN D3) 25 MCG (1000 UT) tablet Take 2,000 Units by mouth daily.     No facility-administered medications prior to visit.     Allergies  Allergen Reactions  . Nsaids Other (See Comments)    Kidney disease  . Morphine And Related Other (See Comments)    "Real bad headaches"     ROS Review of Systems  Constitutional: Negative for activity change, appetite change and fatigue.  HENT: Negative.   Respiratory: Negative for shortness of breath.   Cardiovascular: Negative for leg swelling.  Gastrointestinal: Negative for blood in stool.  Endocrine: Negative.   Genitourinary: Negative.   Musculoskeletal: Positive for joint swelling and myalgias.       Left foot pain and swelling  Neurological: Negative.   Hematological: Negative.   Psychiatric/Behavioral: Negative.       Objective:    Physical Exam  Constitutional: He is oriented to person, place, and time. He appears well-developed and well-nourished.  HENT:  Head: Normocephalic.  Eyes: Pupils are equal, round, and reactive to light.  Neck: Normal range of motion.  Cardiovascular: Normal rate and regular rhythm.  Pulmonary/Chest: Effort normal and breath sounds normal.  Abdominal: Soft. Bowel sounds are normal.  Musculoskeletal:     Left  ankle: He exhibits decreased range of motion and swelling. He exhibits no deformity and normal pulse.  Neurological: He is alert and oriented to person, place, and time.  Skin: Skin is warm and dry.  Psychiatric: He has a normal mood and affect. His behavior is normal. Judgment and thought content normal.    BP 108/79 (BP Location: Left Arm, Patient Position: Sitting, Cuff Size: Normal)   Pulse 100   Temp 97.9 F (36.6 C) (Oral)   Resp 16   Ht 5\' 8"  (1.727 m)   Wt 128 lb (58.1 kg)   SpO2 100%   BMI 19.46 kg/m  Wt Readings from Last 3 Encounters:  12/10/18 128 lb (58.1 kg)  11/26/18 129 lb (58.5 kg)  11/20/18 122 lb 11.2 oz (55.7 kg)     Health Maintenance Due  Topic Date Due  . INFLUENZA VACCINE  09/14/2018    There are no preventive care reminders to display for this patient.  Lab Results  Component Value Date   TSH 2.860 05/11/2017   Lab Results  Component Value Date   WBC 4.7 12/02/2018   HGB 7.9 (L) 12/02/2018   HCT 22.6 (L) 12/02/2018   MCV 104.6 (H) 12/02/2018   PLT 281 12/02/2018   Lab Results  Component Value Date   NA 134 (L) 12/02/2018   K 4.9 12/02/2018   CO2 14 (L) 12/02/2018   GLUCOSE 100 (H) 12/02/2018   BUN 18 12/02/2018   CREATININE 2.29 (H) 12/02/2018   BILITOT 1.2 12/01/2018   ALKPHOS 240 (H) 12/01/2018   AST 115 (H) 12/01/2018   ALT 61 (H) 12/01/2018   PROT 6.0 (L) 12/01/2018   ALBUMIN 2.7 (L) 12/01/2018   CALCIUM 8.9 12/02/2018   ANIONGAP 9 12/02/2018   Lab Results  Component Value Date   CHOL 145 03/07/2018   Lab Results  Component Value Date   HDL 65 03/07/2018   Lab Results  Component Value Date   LDLCALC 30 03/07/2018   Lab Results  Component  Value Date   TRIG 251 (H) 03/07/2018   Lab Results  Component Value Date   CHOLHDL 2.2 03/07/2018   Lab Results  Component Value Date   HGBA1C <4.2 (L) 03/07/2018      Assessment & Plan:   Problem List Items Addressed This Visit    None    Visit Diagnoses    Hb-SS  disease without crisis ([place])    -  Primary   Relevant Orders   Urinalysis Dipstick (Completed)      Sickle cell nephropathy without crisis ([place]) Patient has a history of sickle cell nephropathy. He is followed by [facility]. Prior to hospital discharge, creatinine was 2.2 and GFR 41 which was consistent with patient's baseline. Patient will continue to follow up with nephrology.   Prior to hospital discharge, hemoglobin was 7.9 following transfusion of 1 unit packed red blood cells. Patient's baseline is 7-8. Repeat CBC with differential.  Continue Hydrea. We discussed the need for good hydration, monitoring of hydration status, avoidance of heat, cold, stress, and infection triggers. We discussed the risks and benefits of Hydrea, including bone marrow suppression, the possibility of GI upset, skin ulcers, hair thinning, and teratogenicity. The patient was reminded of the need to seek medical attention of any symptoms of bleeding, anemia, or infection. Continue folic acid 1 mg daily to prevent aplastic bone marrow crises.   Pulmonary evaluation - Patient denies severe recurrent wheezes, shortness of breath with exercise, or persistent cough. If these symptoms develop, pulmonary function tests with spirometry will be ordered, and if abnormal, plan on referral to Pulmonology for further evaluation.  Cardiac - Routine screening for pulmonary hypertension is not recommended.  Eye - High risk of proliferative retinopathy. Annual eye exam with retinal exam recommended to patient.  Acute and chronic painful episodes - Discussed pain medication regimen at length. Will review PDMP prior to prescribing pain medications.   Follow-up: 1 month for medication management.    [name]  APRN, MSN, FNP-C [facility] [address] [place], [place] [address] [phone]   This note was prepared using Dragon speech recognition software,  errors in dictation are unintentional.

## 2018-12-13 ENCOUNTER — Telehealth: Payer: Self-pay

## 2018-12-13 NOTE — Telephone Encounter (Signed)
Refill request for oxycodone 10mg  and 15mg  Please advise. Thanks!

## 2018-12-16 ENCOUNTER — Other Ambulatory Visit: Payer: Self-pay | Admitting: Family Medicine

## 2018-12-16 DIAGNOSIS — N049 Nephrotic syndrome with unspecified morphologic changes: Secondary | ICD-10-CM | POA: Diagnosis not present

## 2018-12-16 DIAGNOSIS — D571 Sickle-cell disease without crisis: Secondary | ICD-10-CM

## 2018-12-16 MED ORDER — OXYCODONE HCL 10 MG PO TABS: 10.0000 mg | ORAL_TABLET | Freq: Four times a day (QID) | ORAL | 0 refills | Status: DC | PRN

## 2018-12-16 NOTE — Progress Notes (Signed)
   Meds ordered this encounter  Medications  . Oxycodone HCl 10 MG TABS    Sig: Take 1 tablet (10 mg total) by mouth every 6 (six) hours as needed.    Dispense:  30 tablet    Refill:  0    Order Specific Question:   Supervising Provider    Answer:   [name] [id number]     [name]  APRN, MSN, FNP-C [name] [address] [place], [place] [address] [phone]

## 2018-12-16 NOTE — Telephone Encounter (Signed)
[place], Patient has called back today asking if drug screen is back and asking for his medications. Please advise.

## 2018-12-16 NOTE — Progress Notes (Signed)
He says he can not come back until the 12/24/18 due to transportation issues but is aware that he can't get a another rx until he comes in. Thanks!

## 2018-12-24 ENCOUNTER — Other Ambulatory Visit: Payer: Medicare Other

## 2018-12-24 ENCOUNTER — Other Ambulatory Visit: Payer: Self-pay

## 2018-12-24 ENCOUNTER — Other Ambulatory Visit: Payer: Self-pay | Admitting: Family Medicine

## 2018-12-24 DIAGNOSIS — D571 Sickle-cell disease without crisis: Secondary | ICD-10-CM | POA: Diagnosis not present

## 2018-12-24 DIAGNOSIS — G894 Chronic pain syndrome: Secondary | ICD-10-CM | POA: Diagnosis not present

## 2018-12-24 NOTE — Progress Notes (Signed)
Orders Placed This Encounter  Procedures  . [id number] 11+Oxyco+Alc+Crt-Bund    Standing Status:   Future    Standing Expiration Date:   12/24/2019  . CBC with Differential    Standing Status:   Future    Standing Expiration Date:   12/24/2019  . Reticulocytes    Standing Status:   Future    Standing Expiration Date:   12/24/2019     [name]  APRN, MSN, FNP-C [facility] [address] [place], [place] [address] [phone]

## 2018-12-25 ENCOUNTER — Telehealth: Payer: Self-pay

## 2018-12-25 LAB — CBC WITH DIFFERENTIAL/PLATELET
Basophils Absolute: 0 10*3/uL (ref 0.0–0.2)
Basos: 0 %
EOS (ABSOLUTE): 0 10*3/uL (ref 0.0–0.4)
Eos: 0 %
Hematocrit: 19.4 % — ABNORMAL LOW (ref 37.5–51.0)
Hemoglobin: 6.7 g/dL — CL (ref 13.0–17.7)
Immature Grans (Abs): 0.1 10*3/uL (ref 0.0–0.1)
Immature Granulocytes: 1 %
Lymphocytes Absolute: 0.8 10*3/uL (ref 0.7–3.1)
Lymphs: 14 %
MCH: 38.5 pg — ABNORMAL HIGH (ref 26.6–33.0)
MCHC: 34.5 g/dL (ref 31.5–35.7)
MCV: 112 fL — ABNORMAL HIGH (ref 79–97)
Monocytes Absolute: 0.2 10*3/uL (ref 0.1–0.9)
Monocytes: 4 %
NRBC: 5 % — ABNORMAL HIGH (ref 0–0)
Neutrophils Absolute: 5 10*3/uL (ref 1.4–7.0)
Neutrophils: 81 %
Platelets: 243 10*3/uL (ref 150–450)
RBC: 1.74 x10E6/uL — CL (ref 4.14–5.80)
WBC: 6.2 10*3/uL (ref 3.4–10.8)

## 2018-12-25 LAB — RETICULOCYTES: Retic Ct Pct: 7.9 % — ABNORMAL HIGH (ref 0.6–2.6)

## 2018-12-25 NOTE — Telephone Encounter (Signed)
Called, no answer. Left a message on voicemail to call back. Thanks!

## 2018-12-25 NOTE — Telephone Encounter (Signed)
Called and spoke with patient advised that hemoglobin is 6.7 which is below baseline. He denies dizziness, fatigue, shortness of breath, or heart palpations. Advised that we will closely monitor and results will be sent to hematologist to review.

## 2018-12-25 NOTE — Telephone Encounter (Signed)
-----   Message from [name], [place] sent at 12/25/2018  7:24 AM EST ----- Regarding: lab results Please inform patient that hemoglobin is 6.7, which is below his baseline. Inquire whether he is experiencing dizziness, fatigue, shortness of breath, and/or heart palpitations. He is typically not transfused unless hemoglobin is <6.0 or symptomatic anemia is present. Will follow closely and lab values are available for his hematologist to review.   [name]  APRN, MSN, FNP-C [facility] [address] [place], [place] [address] [phone]

## 2018-12-26 ENCOUNTER — Other Ambulatory Visit: Payer: Self-pay

## 2018-12-26 ENCOUNTER — Ambulatory Visit (INDEPENDENT_AMBULATORY_CARE_PROVIDER_SITE_OTHER): Payer: Medicare Other | Admitting: Orthopaedic Surgery

## 2018-12-26 ENCOUNTER — Encounter: Payer: Self-pay | Admitting: Orthopaedic Surgery

## 2018-12-26 DIAGNOSIS — M79672 Pain in left foot: Secondary | ICD-10-CM | POA: Diagnosis not present
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
## 2018-12-27 ENCOUNTER — Telehealth: Payer: Self-pay

## 2018-12-27 NOTE — Telephone Encounter (Signed)
Called and advised that labs are not back and that he will have to wait until resulted. Advised if pain gets too bad to go to ER for treatment or try otc tylenol and hydrate. Thanks!

## 2018-12-27 NOTE — Telephone Encounter (Signed)
Patient is asking if urine is back and if he can have his pain meds, he states he is starting to have pain since the weather has changed. Please advise.

## 2018-12-28 IMAGING — CR DG CHEST 2V
2 series · 2 of 2 positions shown · non-contrast
Comparison: May 15, 2017

CLINICAL DATA: Sickle cell crisis.  Pain.

EXAM:
CHEST - 2 VIEW

[w chest lat]
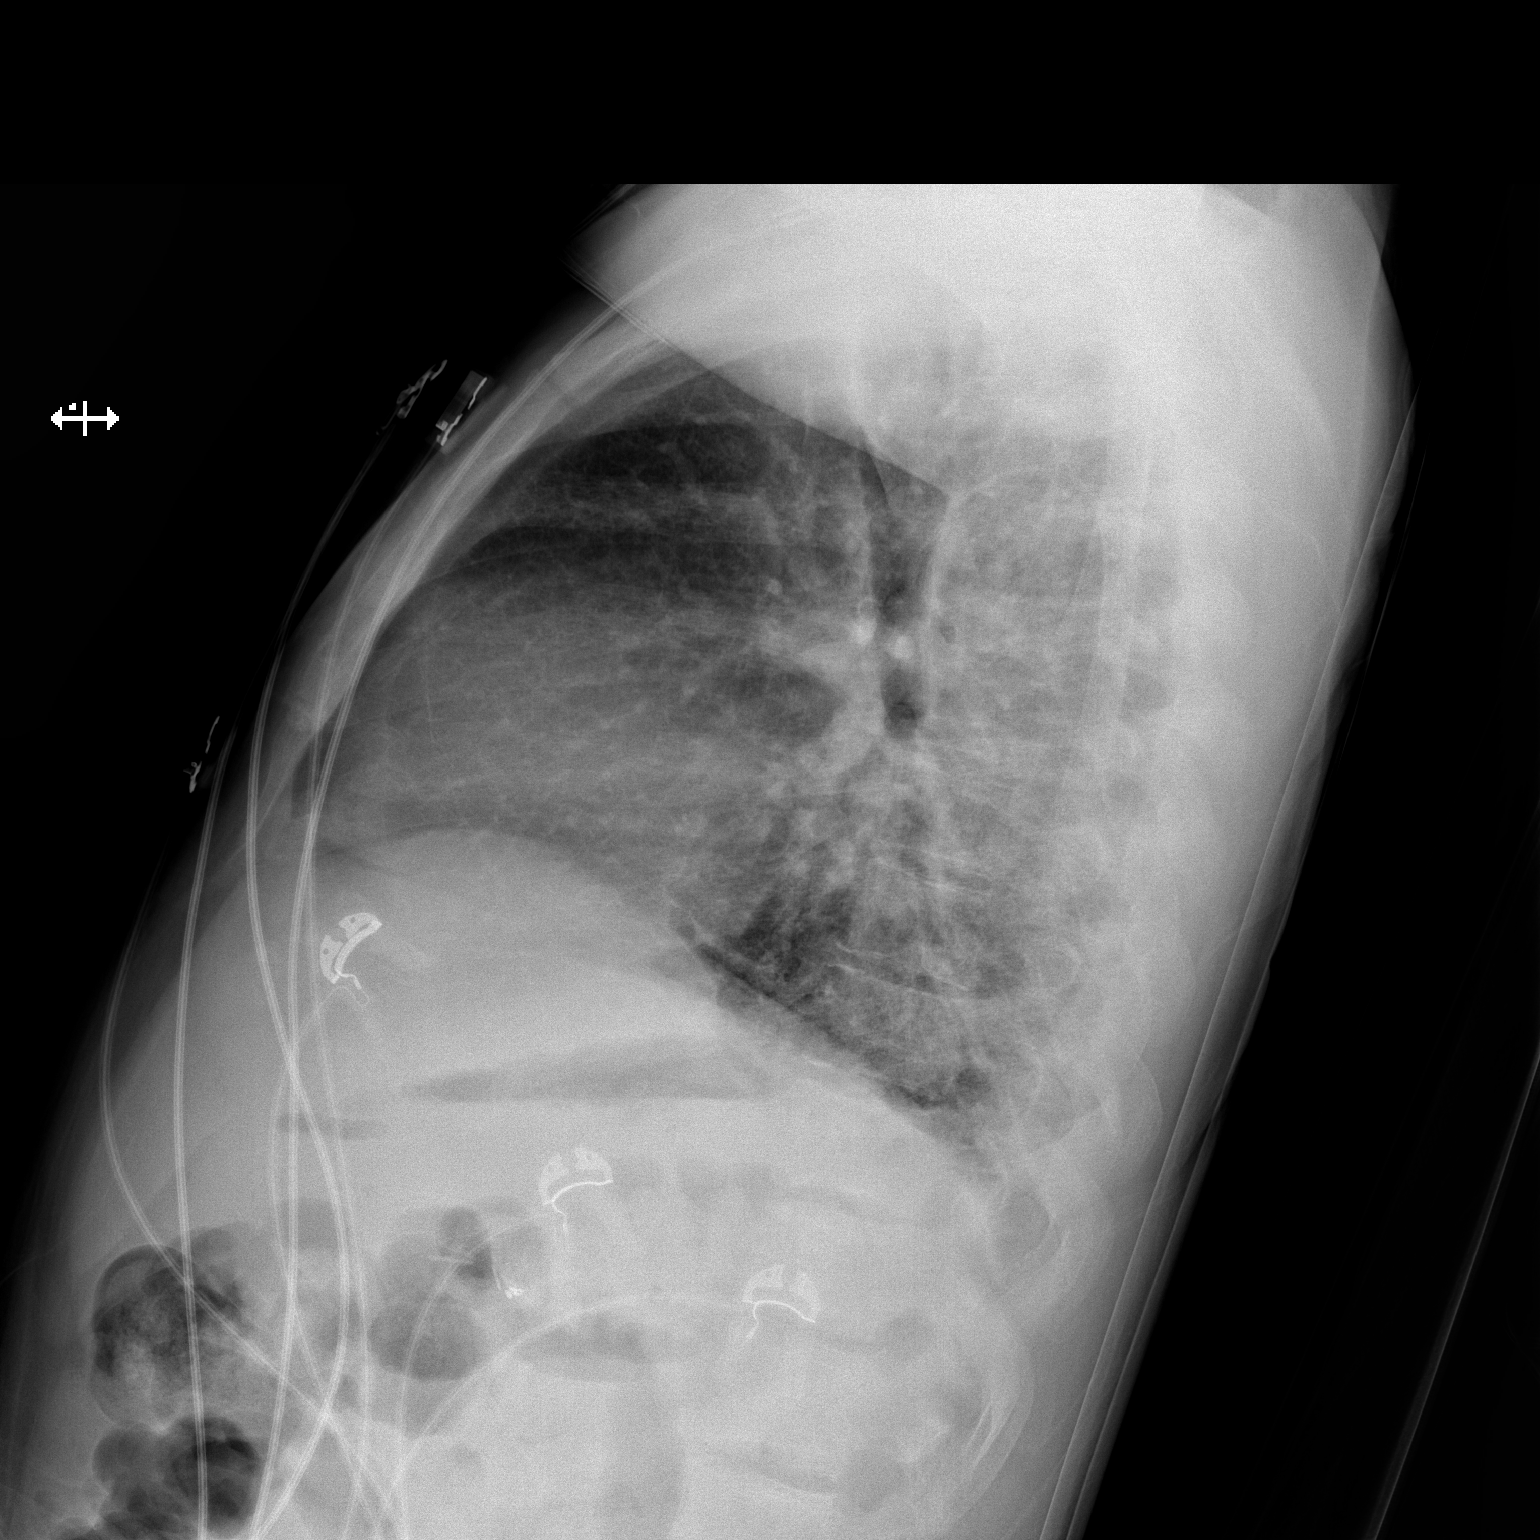

[x chest ap]
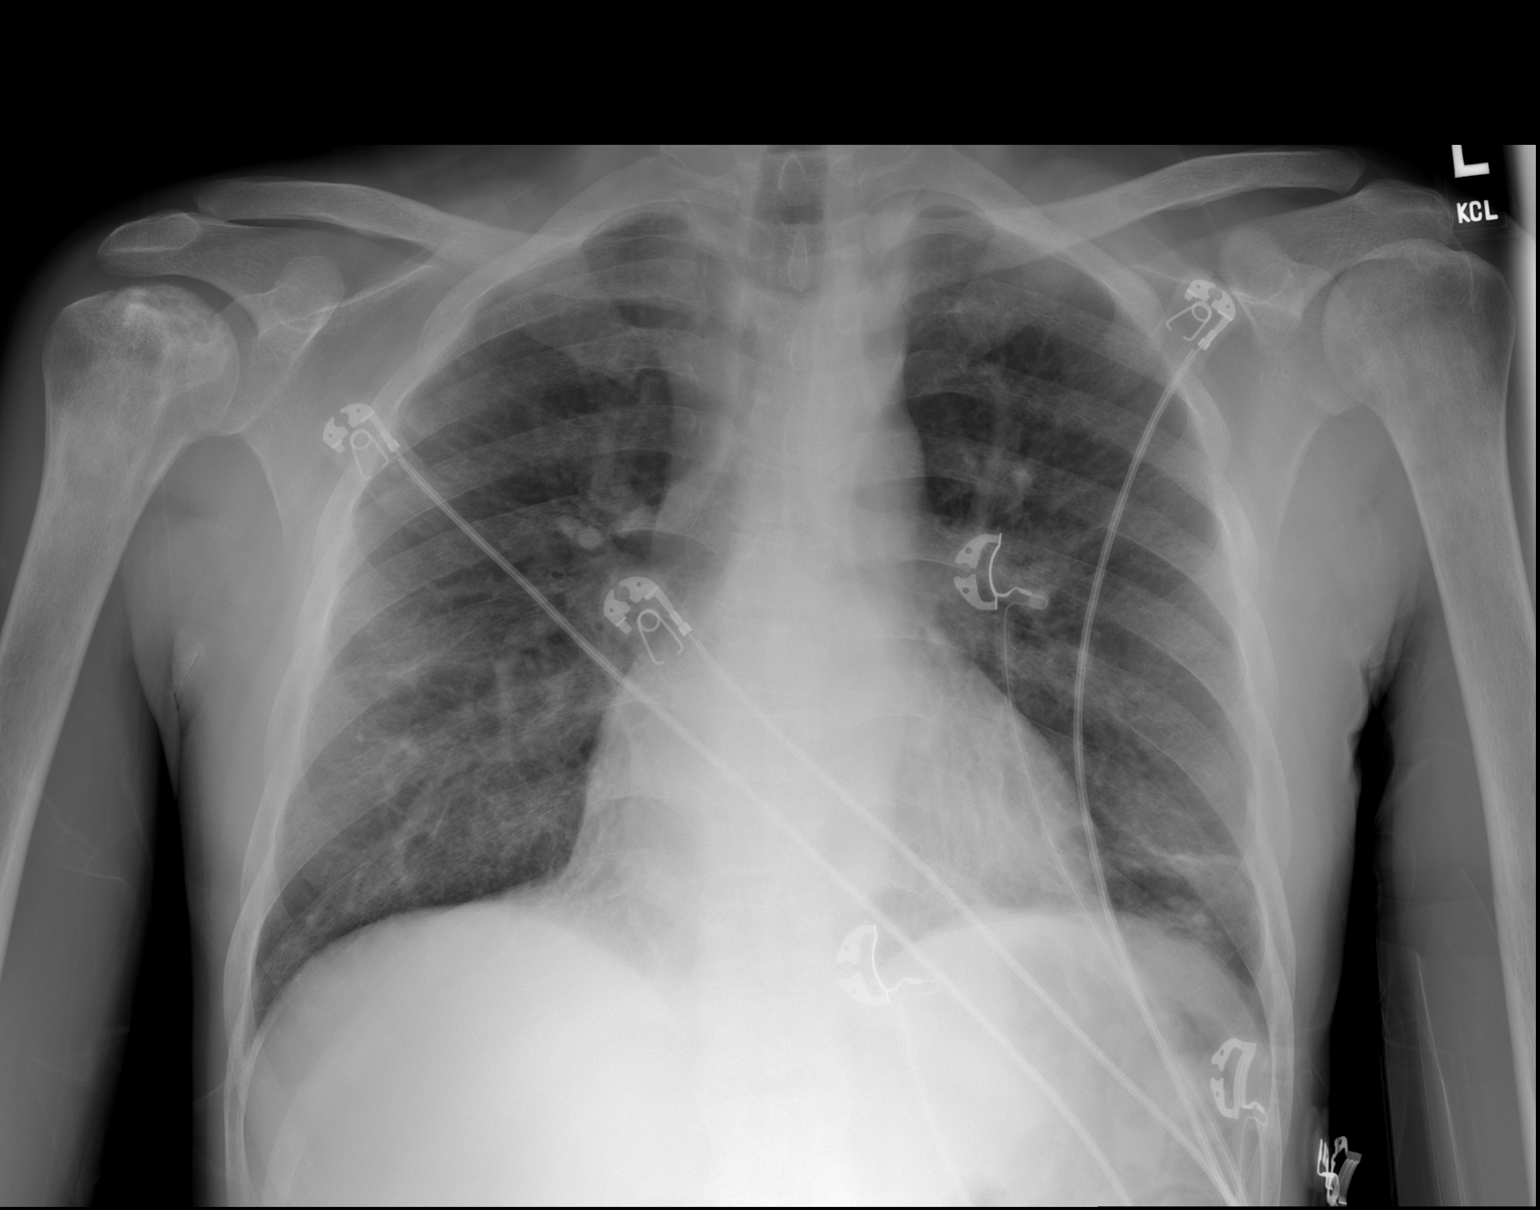

[2 of 2 positions shown; findings below may reference images not displayed]

FINDINGS: Mild increased opacity in the left base. The heart, hila, and
mediastinum are stable. No other acute abnormalities.
IMPRESSION: Mild increased opacity in the left base favored to represent
atelectasis. Changes in the bones consistent with sickle cell.

## 2018-12-30 ENCOUNTER — Telehealth: Payer: Self-pay | Admitting: Internal Medicine

## 2018-12-30 NOTE — Telephone Encounter (Signed)
[place], Patient is calling about medication again today. I have spoke with Merrilee Seashore he is going to check on these results.

## 2018-12-31 ENCOUNTER — Other Ambulatory Visit: Payer: Self-pay | Admitting: Family Medicine

## 2018-12-31 DIAGNOSIS — D571 Sickle-cell disease without crisis: Secondary | ICD-10-CM

## 2018-12-31 DIAGNOSIS — G894 Chronic pain syndrome: Secondary | ICD-10-CM

## 2018-12-31 LAB — DRUG SCREEN 764883 11+OXYCO+ALC+CRT-BUND
Amphetamines, Urine: NEGATIVE ng/mL
BENZODIAZ UR QL: NEGATIVE ng/mL
Barbiturate: NEGATIVE ng/mL
Cocaine (Metabolite): NEGATIVE ng/mL
Creatinine: 61 mg/dL (ref 20.0–300.0)
Ethanol: NEGATIVE %
Meperidine: NEGATIVE ng/mL
Methadone Screen, Urine: NEGATIVE ng/mL
OPIATE SCREEN URINE: NEGATIVE ng/mL
Phencyclidine: NEGATIVE ng/mL
Propoxyphene: NEGATIVE ng/mL
Tramadol: NEGATIVE ng/mL
pH, Urine: 6.4 (ref 4.5–8.9)

## 2018-12-31 LAB — OXYCODONE/OXYMORPHONE, CONFIRM
OXYCODONE/OXYMORPH: POSITIVE — AB
OXYCODONE: 577 ng/mL
OXYCODONE: POSITIVE — AB
OXYMORPHONE (GC/MS): 654 ng/mL
OXYMORPHONE: POSITIVE — AB

## 2018-12-31 LAB — CANNABINOID CONFIRMATION, UR
CANNABINOIDS: POSITIVE — AB
Carboxy THC GC/MS Conf: >300 ng/mL

## 2018-12-31 MED ORDER — OXYCODONE HCL 10 MG PO TABS: 10.0000 mg | ORAL_TABLET | Freq: Four times a day (QID) | ORAL | 0 refills | Status: DC | PRN

## 2018-12-31 MED ORDER — OXYCODONE HCL ER 15 MG PO T12A: 15.0000 mg | EXTENDED_RELEASE_TABLET | Freq: Two times a day (BID) | ORAL | 0 refills | Status: DC

## 2018-12-31 NOTE — Progress Notes (Signed)
Reviewed PDMP prior to prescribing medication, no inconsistencies noted.  Meds ordered this encounter  Medications  . Oxycodone HCl 10 MG TABS    Sig: Take 1 tablet (10 mg total) by mouth every 6 (six) hours as needed.    Dispense:  60 tablet    Refill:  0    Order Specific Question:   Supervising Provider    Answer:   [name] [id number]  . oxyCODONE (OXYCONTIN) 15 mg 12 hr tablet    Sig: Take 1 tablet (15 mg total) by mouth every 12 (twelve) hours.    Dispense:  60 tablet    Refill:  0    Order Specific Question:   Supervising Provider    Answer:   [name] [id number]    [name]  APRN, MSN, FNP-C [facility] [address] [place], [place] [address] [phone]

## 2019-01-01 DIAGNOSIS — N049 Nephrotic syndrome with unspecified morphologic changes: Secondary | ICD-10-CM | POA: Diagnosis not present

## 2019-01-01 DIAGNOSIS — E559 Vitamin D deficiency, unspecified: Secondary | ICD-10-CM | POA: Diagnosis not present

## 2019-01-01 DIAGNOSIS — N1832 Chronic kidney disease, stage 3b: Secondary | ICD-10-CM | POA: Diagnosis not present

## 2019-01-01 DIAGNOSIS — E872 Acidosis: Secondary | ICD-10-CM | POA: Diagnosis not present

## 2019-01-01 DIAGNOSIS — D631 Anemia in chronic kidney disease: Secondary | ICD-10-CM | POA: Diagnosis not present

## 2019-01-06 ENCOUNTER — Inpatient Hospital Stay (HOSPITAL_COMMUNITY)
Admission: EM | Admit: 2019-01-06 | Discharge: 2019-01-08 | DRG: 812 | Disposition: A | Discharge status: Home / Self Care | Payer: Medicare Other | Attending: Internal Medicine | Admitting: Internal Medicine

## 2019-01-06 ENCOUNTER — Other Ambulatory Visit: Payer: Self-pay

## 2019-01-06 ENCOUNTER — Emergency Department (HOSPITAL_COMMUNITY): Payer: Medicare Other

## 2019-01-06 ENCOUNTER — Encounter (HOSPITAL_COMMUNITY): Payer: Self-pay

## 2019-01-06 DIAGNOSIS — Z79891 Long term (current) use of opiate analgesic: Secondary | ICD-10-CM | POA: Diagnosis not present

## 2019-01-06 DIAGNOSIS — Z20828 Contact with and (suspected) exposure to other viral communicable diseases: Secondary | ICD-10-CM | POA: Diagnosis present

## 2019-01-06 DIAGNOSIS — E872 Acidosis: Secondary | ICD-10-CM | POA: Diagnosis present

## 2019-01-06 DIAGNOSIS — Z803 Family history of malignant neoplasm of breast: Secondary | ICD-10-CM | POA: Diagnosis not present

## 2019-01-06 DIAGNOSIS — Z9081 Acquired absence of spleen: Secondary | ICD-10-CM | POA: Diagnosis not present

## 2019-01-06 DIAGNOSIS — Z9049 Acquired absence of other specified parts of digestive tract: Secondary | ICD-10-CM

## 2019-01-06 DIAGNOSIS — Z79899 Other long term (current) drug therapy: Secondary | ICD-10-CM

## 2019-01-06 DIAGNOSIS — Z87448 Personal history of other diseases of urinary system: Secondary | ICD-10-CM

## 2019-01-06 DIAGNOSIS — G43909 Migraine, unspecified, not intractable, without status migrainosus: Secondary | ICD-10-CM | POA: Diagnosis present

## 2019-01-06 DIAGNOSIS — R911 Solitary pulmonary nodule: Secondary | ICD-10-CM | POA: Diagnosis present

## 2019-01-06 DIAGNOSIS — G894 Chronic pain syndrome: Secondary | ICD-10-CM | POA: Diagnosis present

## 2019-01-06 DIAGNOSIS — K219 Gastro-esophageal reflux disease without esophagitis: Secondary | ICD-10-CM | POA: Diagnosis present

## 2019-01-06 DIAGNOSIS — R519 Headache, unspecified: Secondary | ICD-10-CM | POA: Diagnosis not present

## 2019-01-06 DIAGNOSIS — Z885 Allergy status to narcotic agent status: Secondary | ICD-10-CM | POA: Diagnosis not present

## 2019-01-06 DIAGNOSIS — F17210 Nicotine dependence, cigarettes, uncomplicated: Secondary | ICD-10-CM | POA: Diagnosis present

## 2019-01-06 DIAGNOSIS — Z886 Allergy status to analgesic agent status: Secondary | ICD-10-CM

## 2019-01-06 DIAGNOSIS — N183 Chronic kidney disease, stage 3 unspecified: Secondary | ICD-10-CM | POA: Diagnosis present

## 2019-01-06 DIAGNOSIS — D5700 Hb-SS disease with crisis, unspecified: Principal | ICD-10-CM | POA: Diagnosis present

## 2019-01-06 LAB — COMPREHENSIVE METABOLIC PANEL
ALT: 25 U/L (ref 0–44)
AST: 28 U/L (ref 15–41)
Albumin: 3.1 g/dL — ABNORMAL LOW (ref 3.5–5.0)
Alkaline Phosphatase: 202 U/L — ABNORMAL HIGH (ref 38–126)
Anion gap: 10 (ref 5–15)
BUN: 34 mg/dL — ABNORMAL HIGH (ref 6–20)
CO2: 18 mmol/L — ABNORMAL LOW (ref 22–32)
Calcium: 8.5 mg/dL — ABNORMAL LOW (ref 8.9–10.3)
Chloride: 111 mmol/L (ref 98–111)
Creatinine, Ser: 2.33 mg/dL — ABNORMAL HIGH (ref 0.61–1.24)
GFR calc Af Amer: 40 mL/min — ABNORMAL LOW (ref >60)
GFR calc non Af Amer: 35 mL/min — ABNORMAL LOW (ref >60)
Glucose, Bld: 82 mg/dL (ref 70–99)
Potassium: 4.7 mmol/L (ref 3.5–5.1)
Sodium: 139 mmol/L (ref 135–145)
Total Bilirubin: 1.2 mg/dL (ref 0.3–1.2)
Total Protein: 6.2 g/dL — ABNORMAL LOW (ref 6.5–8.1)

## 2019-01-06 LAB — RETICULOCYTES
Immature Retic Fract: 47.6 % — ABNORMAL HIGH (ref 2.3–15.9)
RBC.: 2.74 MIL/uL — ABNORMAL LOW (ref 4.22–5.81)
Retic Count, Absolute: 277.3 10*3/uL — ABNORMAL HIGH (ref 19.0–186.0)
Retic Ct Pct: 10.1 % — ABNORMAL HIGH (ref 0.4–3.1)

## 2019-01-06 LAB — CBC WITH DIFFERENTIAL/PLATELET
Abs Immature Granulocytes: 0.15 10*3/uL — ABNORMAL HIGH (ref 0.00–0.07)
Basophils Absolute: 0 10*3/uL (ref 0.0–0.1)
Basophils Relative: 0 %
Eosinophils Absolute: 0.1 10*3/uL (ref 0.0–0.5)
Eosinophils Relative: 1 %
HCT: 32.4 % — ABNORMAL LOW (ref 39.0–52.0)
Hemoglobin: 11.2 g/dL — ABNORMAL LOW (ref 13.0–17.0)
Immature Granulocytes: 2 %
Lymphocytes Relative: 37 %
Lymphs Abs: 2.7 10*3/uL (ref 0.7–4.0)
MCH: 40.9 pg — ABNORMAL HIGH (ref 26.0–34.0)
MCHC: 34.6 g/dL (ref 30.0–36.0)
MCV: 118.2 fL — ABNORMAL HIGH (ref 80.0–100.0)
Monocytes Absolute: 0.8 10*3/uL (ref 0.1–1.0)
Monocytes Relative: 11 %
Neutro Abs: 3.5 10*3/uL (ref 1.7–7.7)
Neutrophils Relative %: 49 %
Platelets: 352 10*3/uL (ref 150–400)
RBC: 2.74 MIL/uL — ABNORMAL LOW (ref 4.22–5.81)
WBC: 7.2 10*3/uL (ref 4.0–10.5)
nRBC: 4.5 % — ABNORMAL HIGH (ref 0.0–0.2)

## 2019-01-06 MED ORDER — SODIUM CHLORIDE 0.9% FLUSH: 3.0000 mL | Freq: Once | INTRAVENOUS | Status: DC

## 2019-01-06 MED ORDER — HYDROMORPHONE HCL 1 MG/ML IJ SOLN
1.0000 mg | INTRAMUSCULAR | Status: AC
  Administered 2019-01-07: 1 mg via SUBCUTANEOUS

## 2019-01-06 MED ORDER — HYDROMORPHONE HCL 1 MG/ML IJ SOLN: 0.5000 mg | INTRAMUSCULAR | Status: AC

## 2019-01-06 MED ORDER — HYDROMORPHONE HCL 1 MG/ML IJ SOLN: 1.0000 mg | INTRAMUSCULAR | Status: AC

## 2019-01-06 MED ORDER — HYDROMORPHONE HCL 1 MG/ML IJ SOLN: 1.0000 mg | INTRAMUSCULAR | Status: DC

## 2019-01-06 MED ORDER — DEXTROSE-NACL 5-0.45 % IV SOLN
INTRAVENOUS | Status: DC
  Administered 2019-01-06 – 2019-01-08 (×3): via INTRAVENOUS

## 2019-01-06 MED ORDER — HYDROMORPHONE HCL 1 MG/ML IJ SOLN
1.0000 mg | INTRAMUSCULAR | Status: DC
  Filled 2019-01-06: qty 1

## 2019-01-06 MED ORDER — HYDROMORPHONE HCL 1 MG/ML IJ SOLN
0.5000 mg | INTRAMUSCULAR | Status: AC
  Administered 2019-01-06: 23:00:00 0.5 mg via INTRAVENOUS
  Filled 2019-01-06: qty 1

## 2019-01-06 MED ORDER — DIPHENHYDRAMINE HCL 50 MG/ML IJ SOLN
25.0000 mg | Freq: Once | INTRAMUSCULAR | Status: AC
  Administered 2019-01-06: 25 mg via INTRAVENOUS
  Filled 2019-01-06: qty 1

## 2019-01-06 MED ORDER — HYDROMORPHONE HCL 1 MG/ML IJ SOLN
1.0000 mg | INTRAMUSCULAR | Status: AC
  Administered 2019-01-06: 23:00:00 1 mg via INTRAVENOUS
  Filled 2019-01-06: qty 1

## 2019-01-06 NOTE — ED Notes (Signed)
Patient refused lab draw.

## 2019-01-06 NOTE — ED Notes (Signed)
IV team at bedside 

## 2019-01-06 NOTE — ED Triage Notes (Signed)
Patient c/o "all over " sickle cell pain since last night. Patient states he also has a headache and "that usually means my Hgb is low."
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
## 2019-01-06 NOTE — ED Notes (Signed)
Patient transported to CT 

## 2019-01-07 ENCOUNTER — Encounter (HOSPITAL_COMMUNITY): Payer: Self-pay | Admitting: Internal Medicine

## 2019-01-07 LAB — SARS CORONAVIRUS 2 (TAT 6-24 HRS): SARS Coronavirus 2: NEGATIVE

## 2019-01-07 MED ORDER — SENNOSIDES-DOCUSATE SODIUM 8.6-50 MG PO TABS
1.0000 | ORAL_TABLET | Freq: Two times a day (BID) | ORAL | Status: DC
  Administered 2019-01-08: 1 via ORAL
  Filled 2019-01-07: qty 1

## 2019-01-07 MED ORDER — SODIUM BICARBONATE 650 MG PO TABS
650.0000 mg | ORAL_TABLET | Freq: Two times a day (BID) | ORAL | Status: DC
  Administered 2019-01-07 – 2019-01-08 (×4): 650 mg via ORAL
  Filled 2019-01-07 (×5): qty 1

## 2019-01-07 MED ORDER — OXYCODONE HCL ER 15 MG PO T12A
15.0000 mg | EXTENDED_RELEASE_TABLET | Freq: Two times a day (BID) | ORAL | Status: DC
  Administered 2019-01-07 – 2019-01-08 (×4): 15 mg via ORAL
  Filled 2019-01-07 (×4): qty 1

## 2019-01-07 MED ORDER — NALOXONE HCL 0.4 MG/ML IJ SOLN: 0.4000 mg | INTRAMUSCULAR | Status: DC | PRN

## 2019-01-07 MED ORDER — HYDROMORPHONE 1 MG/ML IV SOLN: INTRAVENOUS | Status: DC

## 2019-01-07 MED ORDER — POLYETHYLENE GLYCOL 3350 17 G PO PACK: 17.0000 g | PACK | Freq: Every day | ORAL | Status: DC | PRN

## 2019-01-07 MED ORDER — SODIUM CHLORIDE 0.9% FLUSH: 10.0000 mL | INTRAVENOUS | Status: DC | PRN

## 2019-01-07 MED ORDER — TACROLIMUS 1 MG PO CAPS
2.0000 mg | ORAL_CAPSULE | Freq: Two times a day (BID) | ORAL | Status: DC
  Administered 2019-01-08: 2 mg via ORAL
  Filled 2019-01-07 (×2): qty 2

## 2019-01-07 MED ORDER — ENOXAPARIN SODIUM 40 MG/0.4ML ~~LOC~~ SOLN
40.0000 mg | Freq: Every day | SUBCUTANEOUS | Status: DC
  Administered 2019-01-07: 11:00:00 40 mg via SUBCUTANEOUS
  Filled 2019-01-07 (×2): qty 0.4

## 2019-01-07 MED ORDER — HYDROMORPHONE HCL 1 MG/ML IJ SOLN
1.0000 mg | INTRAMUSCULAR | Status: DC | PRN
  Administered 2019-01-07: 1 mg via INTRAVENOUS
  Filled 2019-01-07: qty 1

## 2019-01-07 MED ORDER — NORTRIPTYLINE HCL 25 MG PO CAPS
25.0000 mg | ORAL_CAPSULE | Freq: Every day | ORAL | Status: DC
  Administered 2019-01-07 (×2): 25 mg via ORAL
  Filled 2019-01-07 (×4): qty 1

## 2019-01-07 MED ORDER — FOLIC ACID 1 MG PO TABS
1.0000 mg | ORAL_TABLET | Freq: Every day | ORAL | Status: DC
  Administered 2019-01-07 – 2019-01-08 (×2): 1 mg via ORAL
  Filled 2019-01-07 (×2): qty 1

## 2019-01-07 MED ORDER — HYDROMORPHONE 1 MG/ML IV SOLN
INTRAVENOUS | Status: DC
  Administered 2019-01-07: 5 mg via INTRAVENOUS
  Administered 2019-01-07: 30 mg via INTRAVENOUS
  Administered 2019-01-07: 2.5 mg via INTRAVENOUS
  Administered 2019-01-08: 1.5 mg via INTRAVENOUS
  Administered 2019-01-08: 3.5 mg via INTRAVENOUS
  Administered 2019-01-08: 1.5 mg via INTRAVENOUS
  Filled 2019-01-07: qty 30

## 2019-01-07 MED ORDER — SODIUM CHLORIDE 0.9% FLUSH: 9.0000 mL | INTRAVENOUS | Status: DC | PRN

## 2019-01-07 MED ORDER — HYDROMORPHONE HCL 1 MG/ML IJ SOLN
1.0000 mg | INTRAMUSCULAR | Status: AC | PRN
  Administered 2019-01-07 (×2): 1 mg via INTRAMUSCULAR
  Filled 2019-01-07 (×2): qty 1

## 2019-01-07 MED ORDER — SODIUM CHLORIDE 0.9% FLUSH
10.0000 mL | Freq: Two times a day (BID) | INTRAVENOUS | Status: DC
  Administered 2019-01-07: 21:00:00 10 mL

## 2019-01-07 NOTE — ED Notes (Signed)
PT provided sandwich and water. No other needs at this time.

## 2019-01-07 NOTE — H&P (Signed)
History and Physical    [name] [name]. [id number] DOB: 1980/11/26 DOA: 01/06/2019  PCP: [name], MD  Patient coming from: Home.  Chief Complaint: Body ache and headache.  HPI: [name] [name]. is a 38 y.o. male with history of sickle cell anemia, chronic kidney disease stage III with history of glomerulonephritis, anemia, migraine abscess of the iliac is requiring drainage in August 2020 presents to the ER with complaint of increasing body ache over the last 2 days despite taking his pain relief medications.  Patient also has been having throbbing headache in the occipital area typical of his migraine which patient states happens whenever he has sickle cell pain crisis.  Denies any focal deficits visual symptoms nausea vomiting abdominal pain diarrhea chest pain or shortness of breath.  ED Course: In the ER patient was afebrile not hypoxic.  Labs reveal hemoglobin of 11.2 which is significant increase from his previous around 7.  Creatinine is around 2.3 which is baseline.  Platelets 352.  CT head was unremarkable.  Covid test is pending.  Patient was given multiple dose of pain relief medication despite which patient was still in pain and admitted for pain management.  On exam patient appears nonfocal.  Review of Systems: As per HPI, rest all negative.   Past Medical History:  Diagnosis Date  . Abscess of right iliac fossa 09/24/2018   required Perc Drain 09/24/2018  . Arachnoid Cyst of brain bilaterally    "2 really small ones in the back of my head; inside; saw them w/MRI" (09/25/2012)  . Bacterial pneumonia ~ 2012   "caught it here in the hospital" (09/25/2012)  . Chronic kidney disease    "from my sickle cell" (09/25/2012)  . CKD (chronic kidney disease), stage II   . Colitis 04/19/2017   CT scan abd/ pelvis  . GERD (gastroesophageal reflux disease)    "after I eat alot of spicey foods" (09/25/2012)  . Gynecomastia, male 07/10/2012  . History of blood  transfusion    "always related to sickle cell crisis" (09/25/2012)  . 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   Noted in noted from Hematology notes at [facility]  . Migraines    "take RX qd to prevent them" (09/25/2012)  . Opioid dependence with withdrawal (Exira) 08/30/2012  . Renal insufficiency   . Sickle cell anemia (HCC)   . Sickle cell crisis ([place]) 09/25/2012  . Sickle cell nephropathy ([place]) 07/10/2012  . Sinus tachycardia   . Tachycardia with heart rate 121-140 beats per minute with ambulation 08/04/2016    Past Surgical History:  Procedure Laterality Date  . ABCESS DRAINAGE    . CHOLECYSTECTOMY  ~ 2012  . COLONOSCOPY N/A 04/23/2017   Procedure: COLONOSCOPY;  Surgeon: Irene Shipper, MD;  Location: [facility];  Service: Endoscopy;  Laterality: N/A;  . IR FLUORO GUIDE CV LINE RIGHT  12/17/2016  . IR RADIOLOGIST EVAL & MGMT  10/02/2018  . IR RADIOLOGIST EVAL & MGMT  10/15/2018  . IR REMOVAL TUN CV CATH W/O FL  12/21/2016  . IR US GUIDE VASC ACCESS RIGHT  12/17/2016  . spleenectomy       reports that he has been smoking cigarettes. He has been smoking about 0.10 packs per day. He has never used smokeless tobacco. He reports current alcohol use. He reports previous drug use. Drug: Marijuana.  Allergies  Allergen Reactions  . Nsaids Other (See Comments)    Kidney disease  . Morphine And Related Other (See  Comments)    "Real bad headaches"     Family History  Problem Relation Age of Onset  . Breast cancer Mother     Prior to Admission medications   Medication Sig Start Date End Date Taking? Authorizing Provider  cholecalciferol (VITAMIN D3) 25 MCG (1000 UT) tablet Take 2,000 Units by mouth daily.   Yes [provider]  folic acid (FOLVITE) 1 MG tablet Take 1 tablet (1 mg total) daily by mouth. 12/22/16  Yes Leana Gamer, MD  hydroxyurea (HYDREA) 500 MG capsule TAKE 3 CAPSULES BY MOUTH EVERY DAY Patient taking differently: Take 1,500 mg by mouth daily.   12/24/18  Yes [name], FNP  nortriptyline (PAMELOR) 25 MG capsule Take 1 capsule (25 mg total) by mouth at bedtime as needed for sleep. 06/10/18  Yes Donnamae Jude, MD  oxyCODONE (OXYCONTIN) 15 mg 12 hr tablet Take 1 tablet (15 mg total) by mouth every 12 (twelve) hours. 12/31/18  Yes [name], FNP  Oxycodone HCl 10 MG TABS Take 1 tablet (10 mg total) by mouth every 6 (six) hours as needed. 12/31/18  Yes [name], FNP  sodium bicarbonate 650 MG tablet Take 650 mg by mouth 2 (two) times daily. 11/07/18  Yes [provider]  tacrolimus (PROGRAF) 1 MG capsule Take 2 mg by mouth 2 (two) times daily. 11/12/18  Yes [provider]    Physical Exam: Constitutional: Moderately built and nourished. Vitals:   01/07/19 0100 01/07/19 0200 01/07/19 0300 01/07/19 0400  BP: (!) 145/98 (!) 135/96 (!) 118/95 120/86  Pulse: 91 84 94 94  Resp: 17 17 13 14   Temp:      TempSrc:      SpO2: 100% 99% 100% 96%  Weight:      Height:       Eyes: Anicteric no pallor. ENMT: No discharge from the ears or nose or mouth. Neck: No mass felt.  No neck rigidity. Respiratory: No rhonchi or crepitations. Cardiovascular: S1-S2 heard. Abdomen: Soft nontender bowel sounds present. Musculoskeletal: No edema. Skin: No rash. Neurologic: Alert awake oriented to time place and person.  Moves all extremities. Psychiatric: Appears normal per normal affect.   Labs on Admission: I have personally reviewed following labs and imaging studies  CBC: Recent Labs  Lab 01/06/19 1601  WBC 7.2  NEUTROABS 3.5  HGB 11.2*  HCT 32.4*  MCV 118.2*  PLT [id number]   Basic Metabolic Panel: Recent Labs  Lab 01/06/19 1601  NA 139  K 4.7  CL 111  CO2 18*  GLUCOSE 82  BUN 34*  CREATININE 2.33*  CALCIUM 8.5*   GFR: Estimated Creatinine Clearance: 35.6 mL/min (A) (by C-G formula based on SCr of 2.33 mg/dL (H)). Liver Function Tests: Recent Labs  Lab 01/06/19 1601  AST 28  ALT 25   ALKPHOS 202*  BILITOT 1.2  PROT 6.2*  ALBUMIN 3.1*   No results for input(s): LIPASE, AMYLASE in the last 168 hours. No results for input(s): AMMONIA in the last 168 hours. Coagulation Profile: No results for input(s): INR, PROTIME in the last 168 hours. Cardiac Enzymes: No results for input(s): CKTOTAL, CKMB, CKMBINDEX, TROPONINI in the last 168 hours. BNP (last 3 results) No results for input(s): PROBNP in the last 8760 hours. HbA1C: No results for input(s): HGBA1C in the last 72 hours. CBG: No results for input(s): GLUCAP in the last 168 hours. Lipid Profile: No results for input(s): CHOL, HDL, LDLCALC, TRIG, CHOLHDL, LDLDIRECT in the last  72 hours. Thyroid Function Tests: No results for input(s): TSH, T4TOTAL, FREET4, T3FREE, THYROIDAB in the last 72 hours. Anemia Panel: Recent Labs    01/06/19 1601  RETICCTPCT 10.1*   Urine analysis:    Component Value Date/Time   COLORURINE STRAW (A) 09/23/2018 1522   APPEARANCEUR HAZY (A) 09/23/2018 1522   LABSPEC 1.009 09/23/2018 1522   PHURINE 7.0 09/23/2018 1522   GLUCOSEU 50 (A) 09/23/2018 1522   HGBUR NEGATIVE 09/23/2018 1522   HGBUR small 03/10/2010 1445   BILIRUBINUR neg 12/10/2018 1139   KETONESUR NEGATIVE 09/23/2018 1522   PROTEINUR Positive (A) 12/10/2018 1139   PROTEINUR >=300 (A) 09/23/2018 1522   UROBILINOGEN 0.2 12/10/2018 1139   UROBILINOGEN 0.2 05/11/2017 1335   NITRITE neg 12/10/2018 1139   NITRITE NEGATIVE 09/23/2018 1522   LEUKOCYTESUR Negative 12/10/2018 1139   LEUKOCYTESUR SMALL (A) 09/23/2018 1522   Sepsis Labs: @LABRCNTIP (procalcitonin:4,lacticidven:4) )No results found for this or any previous visit (from the past 240 hour(s)).   Radiological Exams on Admission: Ct Head Wo Contrast  Result Date: 01/06/2019 CLINICAL DATA:  Headache EXAM: CT HEAD WITHOUT CONTRAST TECHNIQUE: Contiguous axial images were obtained from the base of the skull through the vertex without intravenous contrast. COMPARISON:   CT brain 03/28/2010, brain MRI 02/22/2007 FINDINGS: Brain: No acute territorial infarction hemorrhage or new intracranial mass. Left greater than right CSF collections anterior to the temporal lobes with associated volume loss is chronic. The ventricles are stable in size. Vascular: No hyperdense vessels.  No unexpected calcification Skull: Normal. Negative for fracture or focal lesion. Sinuses/Orbits: No acute finding. Other: None IMPRESSION: 1. No CT evidence for acute intracranial abnormality. 2. Mild cortical volume loss somewhat advanced for age with stable left greater than right extra-axial CSF densities along the temporal convexities. Electronically Signed   By: Donavan Foil M.D.   On: 01/06/2019 20:30     Assessment/Plan Principal Problem:   Sickle cell pain crisis ([place]) Active Problems:   Migraine    1. Sickle cell pain crisis with migraine for which patient has been placed on weight-based Dilaudid PCA.  Continue patient's long-acting pain relief medications.  Patient also takes nortriptyline at bedtime for migraine which will be continued. 2. History of sickle cell anemia hemoglobin appears to be increased from baseline.  Repeat CBC is pending. 3. Chronic kidney disease stage III with history of pyelonephritis on tacrolimus which patient states he has been continue to take along with sodium bicarb for metabolic acidosis. 4. History of polysubstance abuse urine drug screen is pending.  Given that patient is requiring multiple doses of pain relief medications and presently on IV PCA will need more than 2 midnight stay in inpatient status.  [place]in 19 test is pending.   DVT prophylaxis: Lovenox. Code Status: Full code. Family Communication: Discussed with patient. Disposition Plan: Home. Consults called: None. Admission status: Inpatient.   [name] MD [facility] Pager [phone].  If 7PM-7AM, please contact night-coverage [email or web address] Password [facility]   01/07/2019, 5:21 AM

## 2019-01-07 NOTE — ED Notes (Signed)
IV team at bedside 

## 2019-01-07 NOTE — Progress Notes (Signed)
Called ED to get report at 1352 and there was no answer. Will wait for RN to call me with report.

## 2019-01-07 NOTE — ED Notes (Addendum)
Attempted to call IV team with no answer due to New IV just placed not flushing.

## 2019-01-07 NOTE — ED Notes (Signed)
Pt given meal tray.

## 2019-01-08 DIAGNOSIS — G43909 Migraine, unspecified, not intractable, without status migrainosus: Secondary | ICD-10-CM

## 2019-01-08 DIAGNOSIS — D5700 Hb-SS disease with crisis, unspecified: Principal | ICD-10-CM

## 2019-01-08 LAB — CBC WITH DIFFERENTIAL/PLATELET
Abs Immature Granulocytes: 0.1 10*3/uL — ABNORMAL HIGH (ref 0.00–0.07)
Basophils Absolute: 0 10*3/uL (ref 0.0–0.1)
Basophils Relative: 0 %
Eosinophils Absolute: 0 10*3/uL (ref 0.0–0.5)
Eosinophils Relative: 0 %
HCT: 22.1 % — ABNORMAL LOW (ref 39.0–52.0)
Hemoglobin: 7.3 g/dL — ABNORMAL LOW (ref 13.0–17.0)
Immature Granulocytes: 1 %
Lymphocytes Relative: 36 %
Lymphs Abs: 3.4 10*3/uL (ref 0.7–4.0)
MCH: 39.5 pg — ABNORMAL HIGH (ref 26.0–34.0)
MCHC: 33 g/dL (ref 30.0–36.0)
MCV: 119.5 fL — ABNORMAL HIGH (ref 80.0–100.0)
Monocytes Absolute: 1 10*3/uL (ref 0.1–1.0)
Monocytes Relative: 11 %
Neutro Abs: 5 10*3/uL (ref 1.7–7.7)
Neutrophils Relative %: 52 %
Platelets: 409 10*3/uL — ABNORMAL HIGH (ref 150–400)
RBC: 1.85 MIL/uL — ABNORMAL LOW (ref 4.22–5.81)
WBC: 9.6 10*3/uL (ref 4.0–10.5)
nRBC: 1.5 % — ABNORMAL HIGH (ref 0.0–0.2)

## 2019-01-08 LAB — BASIC METABOLIC PANEL
Anion gap: 9 (ref 5–15)
BUN: 27 mg/dL — ABNORMAL HIGH (ref 6–20)
CO2: 20 mmol/L — ABNORMAL LOW (ref 22–32)
Calcium: 8.8 mg/dL — ABNORMAL LOW (ref 8.9–10.3)
Chloride: 106 mmol/L (ref 98–111)
Creatinine, Ser: 2.41 mg/dL — ABNORMAL HIGH (ref 0.61–1.24)
GFR calc Af Amer: 39 mL/min — ABNORMAL LOW (ref >60)
GFR calc non Af Amer: 34 mL/min — ABNORMAL LOW (ref >60)
Glucose, Bld: 100 mg/dL — ABNORMAL HIGH (ref 70–99)
Potassium: 4.5 mmol/L (ref 3.5–5.1)
Sodium: 135 mmol/L (ref 135–145)

## 2019-01-08 MED ORDER — ENOXAPARIN SODIUM 30 MG/0.3ML ~~LOC~~ SOLN: 30.0000 mg | Freq: Every day | SUBCUTANEOUS | Status: DC

## 2019-01-08 NOTE — Progress Notes (Signed)
Reviewed discharge paperwork with patient, medication regimen, and follow up appointments. Patient discharged via wheelchair by NT.

## 2019-01-08 NOTE — Discharge Summary (Signed)
Physician Discharge Summary  [name] [name]. ID:[id number] DOB: 07/21/1980 DOA: 01/06/2019  PCP: [name], MD  Admit date: 01/06/2019  Discharge date: 01/08/2019  Discharge Diagnoses:  Principal Problem:   Sickle cell pain crisis ([place]) Active Problems:   Migraine  Discharge Condition: Stable  Disposition:  Follow-up Information    [name], MD. Call in 1 week(s).   Specialty: Internal Medicine Contact information: [place] [id number] [phone]        Buford Dresser, MD .   Specialty: Cardiology Contact information: [address] [place] [address] [phone]          Pt is discharged home in good condition and is to follow up with [name], MD this week to have labs evaluated. [name] A Petter Jr. is instructed to increase activity slowly and balance with rest for the next few days, and use prescribed medication to complete treatment of pain  Diet: Regular Wt Readings from Last 3 Encounters:  01/08/19 58.1 kg  12/26/18 59 kg  12/10/18 58.1 kg   History of present illness:  [name] [name]. is a 38 y.o. male with history of sickle cell anemia, chronic kidney disease stage III with history of glomerulonephritis, anemia, migraine abscess of the iliac is requiring drainage in August 2020 presents to the ER with complaint of increasing body ache over the last 2 days despite taking his pain relief medications.  Patient also has been having throbbing headache in the occipital area typical of his migraine which patient states happens whenever he has sickle cell pain crisis.  Denies any focal deficits visual symptoms nausea vomiting abdominal pain diarrhea chest pain or shortness of breath.  ED Course: In the ER patient was afebrile not hypoxic.  Labs reveal hemoglobin of 11.2 which is significant increase from his previous around 7.  Creatinine is around 2.3 which is  baseline.  Platelets 352.  CT head was unremarkable.  Covid test is pending.  Patient was given multiple dose of pain relief medication despite which patient was still in pain and admitted for pain management.  On exam patient appears nonfocal.  Hospital Course:  Patient was admitted for sickle cell pain crisis and managed appropriately with IVF, and IV Dilaudid via PCA, as well as other adjunct therapies per sickle cell pain management protocols. IV Toradol is contraindicated due to CKD stage III. He is Covid test negative. Patient slowly improved on above regimen, pain returned to baseline, hemoglobin remained stable at baseline, although was 11.2 on admission which is way above patient's baseline, most likely due to dehydration. After rehydration hemoglobin was 7.3 which reflected appropriate hemoglobin at baseline for this patient.  He was slowly weaned off PCA, maintain on his home pain medications, patient ambulating well with no significant pain, tolerating p.o. intake with no restrictions. Patient was discharged home today in a hemodynamically stable condition.  He will follow-up in this clinic as scheduled.  He will be due for medication refill in about a week.  Patient was counseled extensively especially around polysubstance use disorder, patient verbalized understanding and was appreciative of his care during this admission.  Discharge Exam: Vitals:   01/08/19 1000 01/08/19 1201  BP: 117/78   Pulse: (!) 102   Resp: 16 15  Temp: (!) 97.5 F (36.4 C)   SpO2: 96% 99%   Vitals:   01/08/19 0648 01/08/19 0718 01/08/19 1000 01/08/19 1201  BP: 116/88  117/78  Pulse: (!) 105  (!) 102   Resp: 17 18 16 15   Temp: 98.3 F (36.8 C)  (!) 97.5 F (36.4 C)   TempSrc: Oral  Oral   SpO2: 96% 95% 96% 99%  Weight: 58.1 kg     Height:       General appearance : Awake, alert, not in any distress. Speech Clear. Not toxic looking HEENT: Atraumatic and Normocephalic, pupils equally reactive to light  and accomodation Neck: Supple, no JVD. No cervical lymphadenopathy.  Chest: Good air entry bilaterally, no added sounds  CVS: S1 S2 regular, no murmurs.  Abdomen: Bowel sounds present, Non tender and not distended with no gaurding, rigidity or rebound. Extremities: B/L Lower Ext shows no edema, both legs are warm to touch Neurology: Awake alert, and oriented X 3, CN II-XII intact, Non focal Skin: No Rash  Discharge Instructions  Discharge Instructions    Diet - low sodium heart healthy   Complete by: As directed    Increase activity slowly   Complete by: As directed      Allergies as of 01/08/2019      Reactions   Nsaids Other (See Comments)   Kidney disease   Morphine And Related Other (See Comments)   "Real bad headaches"       Medication List    TAKE these medications   cholecalciferol 25 MCG (1000 UT) tablet Commonly known as: VITAMIN D3 Take 2,000 Units by mouth daily.   folic acid 1 MG tablet Commonly known as: FOLVITE Take 1 tablet (1 mg total) daily by mouth.   hydroxyurea 500 MG capsule Commonly known as: HYDREA TAKE 3 CAPSULES BY MOUTH EVERY DAY What changed:   how much to take  how to take this  when to take this  additional instructions   nortriptyline 25 MG capsule Commonly known as: PAMELOR Take 1 capsule (25 mg total) by mouth at bedtime as needed for sleep.   Oxycodone HCl 10 MG Tabs Take 1 tablet (10 mg total) by mouth every 6 (six) hours as needed.   oxyCODONE 15 mg 12 hr tablet Commonly known as: OxyCONTIN Take 1 tablet (15 mg total) by mouth every 12 (twelve) hours.   sodium bicarbonate 650 MG tablet Take 650 mg by mouth 2 (two) times daily.   tacrolimus 1 MG capsule Commonly known as: PROGRAF Take 2 mg by mouth 2 (two) times daily.       The results of significant diagnostics from this hospitalization (including imaging, microbiology, ancillary and laboratory) are listed below for reference.    Significant Diagnostic  Studies: Ct Head Wo Contrast  Result Date: 01/06/2019 CLINICAL DATA:  Headache EXAM: CT HEAD WITHOUT CONTRAST TECHNIQUE: Contiguous axial images were obtained from the base of the skull through the vertex without intravenous contrast. COMPARISON:  CT brain 03/28/2010, brain MRI 02/22/2007 FINDINGS: Brain: No acute territorial infarction hemorrhage or new intracranial mass. Left greater than right CSF collections anterior to the temporal lobes with associated volume loss is chronic. The ventricles are stable in size. Vascular: No hyperdense vessels.  No unexpected calcification Skull: Normal. Negative for fracture or focal lesion. Sinuses/Orbits: No acute finding. Other: None IMPRESSION: 1. No CT evidence for acute intracranial abnormality. 2. Mild cortical volume loss somewhat advanced for age with stable left greater than right extra-axial CSF densities along the temporal convexities. Electronically Signed   By: Donavan Foil M.D.   On: 01/06/2019 20:30    Microbiology: Recent Results (from the past 240 hour(s))  SARS CORONAVIRUS 2 (TAT 6-24 HRS) Nasopharyngeal Nasopharyngeal Swab     Status: None   Collection Time: 01/06/19 10:56 PM   Specimen: Nasopharyngeal Swab  Result Value Ref Range Status   SARS Coronavirus 2 NEGATIVE NEGATIVE Final    Comment: (NOTE) SARS-CoV-2 target nucleic acids are NOT DETECTED. The SARS-CoV-2 RNA is generally detectable in upper and lower respiratory specimens during the acute phase of infection. Negative results do not preclude SARS-CoV-2 infection, do not rule out co-infections with other pathogens, and should not be used as the sole basis for treatment or other patient management decisions. Negative results must be combined with clinical observations, patient history, and epidemiological information. The expected result is Negative. Fact Sheet for Patients: SugarRoll.be Fact Sheet for Healthcare  Providers: https://www.woods-mathews.com/ This test is not yet approved or cleared by the [place] FDA and  has been authorized for detection and/or diagnosis of SARS-CoV-2 by FDA under an Emergency Use Authorization (EUA). This EUA will remain  in effect (meaning this test can be used) for the duration of the COVID-19 declaration under Section 56 4(b)(1) of the Act, 21 U.S.C. section 360bbb-3(b)(1), unless the authorization is terminated or revoked sooner. Performed at [facility], [place] [address], [place], [place] [address]      Labs: Basic Metabolic Panel: Recent Labs  Lab 01/06/19 1601 01/08/19 0620  NA 139 135  K 4.7 4.5  CL 111 106  CO2 18* 20*  GLUCOSE 82 100*  BUN 34* 27*  CREATININE 2.33* 2.41*  CALCIUM 8.5* 8.8*   Liver Function Tests: Recent Labs  Lab 01/06/19 1601  AST 28  ALT 25  ALKPHOS 202*  BILITOT 1.2  PROT 6.2*  ALBUMIN 3.1*   No results for input(s): LIPASE, AMYLASE in the last 168 hours. No results for input(s): AMMONIA in the last 168 hours. CBC: Recent Labs  Lab 01/06/19 1601 01/08/19 0620  WBC 7.2 9.6  NEUTROABS 3.5 5.0  HGB 11.2* 7.3*  HCT 32.4* 22.1*  MCV 118.2* 119.5*  PLT 352 409*   Cardiac Enzymes: No results for input(s): CKTOTAL, CKMB, CKMBINDEX, TROPONINI in the last 168 hours. BNP: Invalid input(s): POCBNP CBG: No results for input(s): GLUCAP in the last 168 hours.  Time coordinating discharge: 50 minutes  Signed:  [facility] 01/08/2019, 2:54 PM

## 2019-01-08 NOTE — Care Management Important Message (Signed)
Important Message  Patient Details IM Letter given to Marney Doctor RN to present to the Patient Name: [name] [name]. MRN: [id number] Date of Birth: [birth year]   Medicare Important Message Given:  Yes     Kerin Salen 01/08/2019, 9:59 AM

## 2019-01-08 NOTE — Discharge Instructions (Signed)
Sickle Cell Anemia, Adult ° °Sickle cell anemia is a condition in which red blood cells have an abnormal “sickle” shape. Red blood cells carry oxygen through the body. Sickle-shaped red blood cells do not live as long as normal red blood cells. They also clump together and block blood from flowing through the blood vessels. This condition prevents the body from getting enough oxygen. Sickle cell anemia causes organ damage and pain. It also increases the risk of infection. °What are the causes? °This condition is caused by a gene that is passed from parent to child (inherited). Receiving two copies of the gene causes the disease. Receiving one copy causes the "trait," which means that symptoms are milder or not present. °What increases the risk? °This condition is more likely to develop if your ancestors were from [place], the Mediterranean, [place] or [place], the [place], [place], or the [place]. °What are the signs or symptoms? °Symptoms of this condition include: °· Episodes of pain (crises), especially in the hands and feet, joints, back, chest, or abdomen. The pain can be triggered by: °? An illness, especially if there is dehydration. °? Doing an activity with great effort (overexertion). °? Exposure to extreme temperature changes. °? High altitude. °· Fatigue. °· Shortness of breath or difficulty breathing. °· Dizziness. °· Pale skin or yellowed skin (jaundice). °· Frequent bacterial infections. °· Pain and swelling in the hands and feet (hand-food syndrome). °· Prolonged, painful erection of the penis (priapism). °· Acute chest syndrome. Symptoms of this include: °? Chest pain. °? Fever. °? Cough. °? Fast breathing. °· Stroke. °· Decreased activity. °· Loss of appetite. °· Change in behavior. °· Headaches. °· Seizures. °· Vision changes. °· Skin ulcers. °· Heart disease. °· High blood pressure. °· Gallstones. °· Liver and kidney problems. °How is this diagnosed? °This condition is diagnosed with  blood tests that check for the gene that causes this condition. °How is this treated? °There is no cure for most cases of this condition. Treatment focuses on managing your symptoms and preventing complications of the disease. Your health care provider will work with you to identify the best treatment options for you based on an assessment of your condition. Treatment may include: °· Medicines, including: °? Pain medicines. °? Antibiotic medicines for infection. °? Medicines to increase the production of a protein in red blood cells that helps carry oxygen in the body (hemoglobin). °· Fluids to treat pain and swelling. °· Oxygen to treat acute chest syndrome. °· Blood transfusions to treat symptoms such as fatigue, stroke, and acute chest syndrome. °· Massage and physical therapy for pain. °· Regular tests to monitor your condition, such as blood tests, X-rays, CT scans, MRI scans, ultrasounds, and lung function tests. These should be done every 3-12 months, depending on your age. °· Hematopoietic stem cell transplant. This is a procedure to replace abnormal stem cells with healthy stem cells from a donor's bone marrow. Stem cells are cells that can develop into blood cells, and bone marrow is the spongy tissue inside the bones. °Follow these instructions at home: °Medicines °· Take over-the-counter and prescription medicines only as told by your health care provider. °· If you were prescribed an antibiotic medicine, take it as told by your health care provider. Do not stop taking the antibiotic even if you start to feel better. °· If you develop a fever, do not take medicines to reduce the fever right away. This could cover up another problem. Notify your health care provider. °Managing   pain, stiffness, and swelling  Try these methods to help ease your pain: ? Using a heating pad. ? Taking a warm bath. ? Distracting yourself, such as by watching TV. Eating and drinking  Drink enough fluid to keep your urine  clear or pale yellow. Drink more in hot weather and during exercise.  Limit or avoid drinking alcohol.  Eat a balanced and nutritious diet. Eat plenty of fruits, vegetables, whole grains, and lean protein.  Take vitamins and supplements as directed by your health care provider. Traveling  When traveling, keep these with you: ? Your medical information. ? The names of your health care providers. ? Your medicines.  If you have to travel by air, ask about precautions you should take. Activity  Get plenty of rest.  Avoid activities that will lower your oxygen levels, such as exercising vigorously. General instructions  Do not use any products that contain nicotine or tobacco, such as cigarettes and e-cigarettes. They lower blood oxygen levels. If you need help quitting, ask your health care provider.  Consider wearing a medical alert bracelet.  Avoid high altitudes.  Avoid extreme temperatures and extreme temperature changes.  Keep all follow-up visits as told by your health care provider. This is important. Contact a health care provider if:  You develop joint pain.  Your feet or hands swell or have pain.  You have fatigue. Get help right away if:  You have symptoms of infection. These include: ? Fever. ? Chills. ? Extreme tiredness. ? Irritability. ? Poor eating. ? Vomiting.  You feel dizzy or faint.  You have new abdominal pain, especially on the left side near the stomach area.  You develop priapism.  You have numbness in your arms or legs or have trouble moving them.  You have trouble talking.  You develop pain that cannot be controlled with medicine.  You become short of breath.  You have rapid breathing.  You have a persistent cough.  You have pain in your chest.  You develop a severe headache or stiff neck.  You feel bloated without eating or after eating a small amount of food.  Your skin is pale.  You suddenly lose  vision. Summary  Sickle cell anemia is a condition in which red blood cells have an abnormal sickle shape. This disease can cause organ damage and chronic pain, and it can raise your risk of infection.  Sickle cell anemia is a genetic disorder.  Treatment focuses on managing your symptoms and preventing complications of the disease.  Get medical help right away if you have any signs of infection, such as a fever. This information is not intended to replace advice given to you by your health care provider. Make sure you discuss any questions you have with your health care provider. Document Released: 05/10/2005 Document Revised: 05/24/2018 Document Reviewed: 03/07/2016 Elsevier Patient Education  2020 [employer].  Chronic Kidney Disease, Adult Chronic kidney disease (CKD) happens when the kidneys are damaged over a long period of time. The kidneys are two organs that help with:  Getting rid of waste and extra fluid from the blood.  Making hormones that maintain the amount of fluid in your tissues and blood vessels.  Making sure that the body has the right amount of fluids and chemicals. Most of the time, CKD does not go away, but it can usually be controlled. Steps must be taken to slow down the kidney damage or to stop it from getting worse. If this is not done,  the kidneys may stop working. Follow these instructions at home: Medicines  Take over-the-counter and prescription medicines only as told by your doctor. You may need to change the amount of medicines you take.  Do not take any new medicines unless your doctor says it is okay. Many medicines can make your kidney damage worse.  Do not take any vitamin and supplements unless your doctor says it is okay. Many vitamins and supplements can make your kidney damage worse. General instructions  Follow a diet as told by your doctor. You may need to stay away from: ? Alcohol. ? Salty foods. ? Foods that are high  in:  Potassium.  Calcium.  Protein.  Do not use any products that contain nicotine or tobacco, such as cigarettes and e-cigarettes. If you need help quitting, ask your doctor.  Keep track of your blood pressure at home. Tell your doctor about any changes.  If you have diabetes, keep track of your blood sugar as told by your doctor.  Try to stay at a healthy weight. If you need help, ask your doctor.  Exercise at least 30 minutes a day, 5 days a week.  Stay up-to-date with your shots (immunizations) as told by your doctor.  Keep all follow-up visits as told by your doctor. This is important. Contact a doctor if:  Your symptoms get worse.  You have new symptoms. Get help right away if:  You have symptoms of end-stage kidney disease. These may include: ? Headaches. ? Numbness in your hands or feet. ? Easy bruising. ? Having hiccups often. ? Chest pain. ? Shortness of breath. ? Stopping of menstrual periods in women.  You have a fever.  You have very little pee (urine).  You have pain or bleeding when you pee. Summary  Chronic kidney disease (CKD) happens when the kidneys are damaged over a long period of time.  Most of the time, this condition does not go away, but it can usually be controlled. Steps must be taken to slow down the kidney damage or to stop it from getting worse.  Treatment may include a combination of medicines and lifestyle changes. This information is not intended to replace advice given to you by your health care provider. Make sure you discuss any questions you have with your health care provider. Document Released: 04/26/2009 Document Revised: 01/12/2017 Document Reviewed: 03/06/2016 Elsevier Patient Education  2020 [employer].

## 2019-01-21 ENCOUNTER — Encounter: Payer: Self-pay | Admitting: Orthopaedic Surgery

## 2019-01-21 ENCOUNTER — Other Ambulatory Visit: Payer: Self-pay

## 2019-01-21 ENCOUNTER — Emergency Department (HOSPITAL_COMMUNITY)
Admission: EM | Admit: 2019-01-21 | Discharge: 2019-01-22 | Disposition: A | Discharge status: Home / Self Care | Payer: Medicare Other | Source: Home / Self Care | Attending: Emergency Medicine | Admitting: Emergency Medicine

## 2019-01-21 ENCOUNTER — Emergency Department (HOSPITAL_COMMUNITY): Payer: Medicare Other

## 2019-01-21 ENCOUNTER — Ambulatory Visit (INDEPENDENT_AMBULATORY_CARE_PROVIDER_SITE_OTHER): Payer: Medicare Other | Admitting: Orthopaedic Surgery

## 2019-01-21 VITALS — Ht 63.0 in | Wt 128.0 lb

## 2019-01-21 DIAGNOSIS — D5700 Hb-SS disease with crisis, unspecified: Secondary | ICD-10-CM

## 2019-01-21 DIAGNOSIS — R0602 Shortness of breath: Secondary | ICD-10-CM | POA: Diagnosis not present

## 2019-01-21 DIAGNOSIS — M79672 Pain in left foot: Secondary | ICD-10-CM

## 2019-01-21 DIAGNOSIS — R509 Fever, unspecified: Secondary | ICD-10-CM | POA: Diagnosis not present

## 2019-01-21 DIAGNOSIS — Z20828 Contact with and (suspected) exposure to other viral communicable diseases: Secondary | ICD-10-CM | POA: Diagnosis not present

## 2019-01-21 DIAGNOSIS — R112 Nausea with vomiting, unspecified: Secondary | ICD-10-CM | POA: Diagnosis not present

## 2019-01-21 MED ORDER — SODIUM CHLORIDE 0.9% FLUSH: 3.0000 mL | Freq: Once | INTRAVENOUS | Status: DC

## 2019-01-21 MED ORDER — HYDROMORPHONE HCL 2 MG/ML IJ SOLN
2.0000 mg | Freq: Once | INTRAMUSCULAR | Status: AC
  Administered 2019-01-21: 2 mg via INTRAMUSCULAR
  Filled 2019-01-21: qty 1

## 2019-01-21 MED ORDER — ACETAMINOPHEN 325 MG PO TABS
650.0000 mg | ORAL_TABLET | Freq: Once | ORAL | Status: AC
  Administered 2019-01-21: 650 mg via ORAL
  Filled 2019-01-21: qty 2

## 2019-01-21 MED ORDER — ONDANSETRON 4 MG PO TBDP
4.0000 mg | ORAL_TABLET | Freq: Once | ORAL | Status: AC
  Administered 2019-01-21: 4 mg via ORAL
  Filled 2019-01-21: qty 1
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
## 2019-01-21 NOTE — Addendum Note (Signed)
Addended by: Lendon Collar on: 01/21/2019 01:22 PM   Modules accepted: Orders

## 2019-01-21 NOTE — ED Notes (Signed)
Iv consult placed for line placement and blood work.

## 2019-01-21 NOTE — ED Notes (Signed)
Patient request lab draw with an IV start.
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
## 2019-01-21 NOTE — ED Triage Notes (Signed)
Pt c/o generalized sickle cell pain that started last night. Reports vomiting foods but can keep down liquids.

## 2019-01-21 NOTE — ED Notes (Signed)
IV team at bedside 

## 2019-01-22 ENCOUNTER — Encounter (HOSPITAL_COMMUNITY): Payer: Self-pay

## 2019-01-22 ENCOUNTER — Inpatient Hospital Stay (HOSPITAL_COMMUNITY)
Admission: AD | Admit: 2019-01-22 | Discharge: 2019-01-25 | DRG: 812 | Disposition: A | Discharge status: Home / Self Care | Payer: Medicare Other | Source: Ambulatory Visit | Attending: Internal Medicine | Admitting: Internal Medicine

## 2019-01-22 DIAGNOSIS — N179 Acute kidney failure, unspecified: Secondary | ICD-10-CM | POA: Diagnosis present

## 2019-01-22 DIAGNOSIS — F1120 Opioid dependence, uncomplicated: Secondary | ICD-10-CM | POA: Diagnosis present

## 2019-01-22 DIAGNOSIS — R778 Other specified abnormalities of plasma proteins: Secondary | ICD-10-CM | POA: Diagnosis not present

## 2019-01-22 DIAGNOSIS — T405X1A Poisoning by cocaine, accidental (unintentional), initial encounter: Secondary | ICD-10-CM | POA: Diagnosis not present

## 2019-01-22 DIAGNOSIS — R112 Nausea with vomiting, unspecified: Secondary | ICD-10-CM | POA: Diagnosis not present

## 2019-01-22 DIAGNOSIS — M79672 Pain in left foot: Secondary | ICD-10-CM | POA: Diagnosis not present

## 2019-01-22 DIAGNOSIS — Z20828 Contact with and (suspected) exposure to other viral communicable diseases: Secondary | ICD-10-CM | POA: Diagnosis not present

## 2019-01-22 DIAGNOSIS — N08 Glomerular disorders in diseases classified elsewhere: Secondary | ICD-10-CM | POA: Diagnosis present

## 2019-01-22 DIAGNOSIS — R0789 Other chest pain: Secondary | ICD-10-CM | POA: Diagnosis present

## 2019-01-22 DIAGNOSIS — D72829 Elevated white blood cell count, unspecified: Secondary | ICD-10-CM | POA: Diagnosis not present

## 2019-01-22 DIAGNOSIS — F17210 Nicotine dependence, cigarettes, uncomplicated: Secondary | ICD-10-CM | POA: Diagnosis present

## 2019-01-22 DIAGNOSIS — F1410 Cocaine abuse, uncomplicated: Secondary | ICD-10-CM | POA: Diagnosis not present

## 2019-01-22 DIAGNOSIS — N184 Chronic kidney disease, stage 4 (severe): Secondary | ICD-10-CM | POA: Diagnosis not present

## 2019-01-22 DIAGNOSIS — D5700 Hb-SS disease with crisis, unspecified: Principal | ICD-10-CM | POA: Diagnosis present

## 2019-01-22 DIAGNOSIS — N1832 Chronic kidney disease, stage 3b: Secondary | ICD-10-CM | POA: Diagnosis not present

## 2019-01-22 DIAGNOSIS — D571 Sickle-cell disease without crisis: Secondary | ICD-10-CM

## 2019-01-22 DIAGNOSIS — R079 Chest pain, unspecified: Secondary | ICD-10-CM | POA: Diagnosis not present

## 2019-01-22 DIAGNOSIS — N189 Chronic kidney disease, unspecified: Secondary | ICD-10-CM | POA: Diagnosis present

## 2019-01-22 DIAGNOSIS — R0602 Shortness of breath: Secondary | ICD-10-CM | POA: Diagnosis not present

## 2019-01-22 DIAGNOSIS — F1910 Other psychoactive substance abuse, uncomplicated: Secondary | ICD-10-CM | POA: Diagnosis not present

## 2019-01-22 DIAGNOSIS — D631 Anemia in chronic kidney disease: Secondary | ICD-10-CM | POA: Diagnosis present

## 2019-01-22 DIAGNOSIS — F17200 Nicotine dependence, unspecified, uncomplicated: Secondary | ICD-10-CM | POA: Diagnosis not present

## 2019-01-22 DIAGNOSIS — G894 Chronic pain syndrome: Secondary | ICD-10-CM | POA: Diagnosis present

## 2019-01-22 DIAGNOSIS — Z803 Family history of malignant neoplasm of breast: Secondary | ICD-10-CM

## 2019-01-22 DIAGNOSIS — N289 Disorder of kidney and ureter, unspecified: Secondary | ICD-10-CM

## 2019-01-22 DIAGNOSIS — R7401 Elevation of levels of liver transaminase levels: Secondary | ICD-10-CM | POA: Diagnosis present

## 2019-01-22 LAB — CBC WITH DIFFERENTIAL/PLATELET
Abs Immature Granulocytes: 0.26 10*3/uL — ABNORMAL HIGH (ref 0.00–0.07)
Basophils Absolute: 0.1 10*3/uL (ref 0.0–0.1)
Basophils Relative: 0 %
Eosinophils Absolute: 0.2 10*3/uL (ref 0.0–0.5)
Eosinophils Relative: 1 %
HCT: 21.7 % — ABNORMAL LOW (ref 39.0–52.0)
Hemoglobin: 7.5 g/dL — ABNORMAL LOW (ref 13.0–17.0)
Immature Granulocytes: 2 %
Lymphocytes Relative: 25 %
Lymphs Abs: 3.6 10*3/uL (ref 0.7–4.0)
MCH: 38.3 pg — ABNORMAL HIGH (ref 26.0–34.0)
MCHC: 34.6 g/dL (ref 30.0–36.0)
MCV: 110.7 fL — ABNORMAL HIGH (ref 80.0–100.0)
Monocytes Absolute: 1.9 10*3/uL — ABNORMAL HIGH (ref 0.1–1.0)
Monocytes Relative: 13 %
Neutro Abs: 8.6 10*3/uL — ABNORMAL HIGH (ref 1.7–7.7)
Neutrophils Relative %: 59 %
Platelets: 185 10*3/uL (ref 150–400)
RBC: 1.96 MIL/uL — ABNORMAL LOW (ref 4.22–5.81)
WBC: 14.6 10*3/uL — ABNORMAL HIGH (ref 4.0–10.5)
nRBC: 1 % — ABNORMAL HIGH (ref 0.0–0.2)

## 2019-01-22 LAB — RAPID URINE DRUG SCREEN, HOSP PERFORMED
Amphetamines: NOT DETECTED
Barbiturates: NOT DETECTED
Benzodiazepines: NOT DETECTED
Cocaine: POSITIVE — AB
Opiates: POSITIVE — AB
Tetrahydrocannabinol: POSITIVE — AB

## 2019-01-22 LAB — COMPREHENSIVE METABOLIC PANEL
ALT: 78 U/L — ABNORMAL HIGH (ref 0–44)
AST: 86 U/L — ABNORMAL HIGH (ref 15–41)
Albumin: 3.4 g/dL — ABNORMAL LOW (ref 3.5–5.0)
Alkaline Phosphatase: 270 U/L — ABNORMAL HIGH (ref 38–126)
Anion gap: 8 (ref 5–15)
BUN: 24 mg/dL — ABNORMAL HIGH (ref 6–20)
CO2: 15 mmol/L — ABNORMAL LOW (ref 22–32)
Calcium: 9.1 mg/dL (ref 8.9–10.3)
Chloride: 113 mmol/L — ABNORMAL HIGH (ref 98–111)
Creatinine, Ser: 2.98 mg/dL — ABNORMAL HIGH (ref 0.61–1.24)
GFR calc Af Amer: 30 mL/min — ABNORMAL LOW (ref >60)
GFR calc non Af Amer: 26 mL/min — ABNORMAL LOW (ref >60)
Glucose, Bld: 108 mg/dL — ABNORMAL HIGH (ref 70–99)
Potassium: 4.1 mmol/L (ref 3.5–5.1)
Sodium: 136 mmol/L (ref 135–145)
Total Bilirubin: 3.7 mg/dL — ABNORMAL HIGH (ref 0.3–1.2)
Total Protein: 7.1 g/dL (ref 6.5–8.1)

## 2019-01-22 LAB — URINALYSIS, ROUTINE W REFLEX MICROSCOPIC
Bacteria, UA: NONE SEEN
Bilirubin Urine: NEGATIVE
Glucose, UA: 50 mg/dL — AB
Ketones, ur: NEGATIVE mg/dL
Leukocytes,Ua: NEGATIVE
Nitrite: NEGATIVE
Protein, ur: >=300 mg/dL — AB
Specific Gravity, Urine: 1.01 (ref 1.005–1.030)
pH: 6 (ref 5.0–8.0)

## 2019-01-22 LAB — RETICULOCYTES
Immature Retic Fract: 36.8 % — ABNORMAL HIGH (ref 2.3–15.9)
RBC.: 1.96 MIL/uL — ABNORMAL LOW (ref 4.22–5.81)
Retic Count, Absolute: 255.8 10*3/uL — ABNORMAL HIGH (ref 19.0–186.0)
Retic Ct Pct: 13.1 % — ABNORMAL HIGH (ref 0.4–3.1)

## 2019-01-22 LAB — SARS CORONAVIRUS 2 (TAT 6-24 HRS): SARS Coronavirus 2: NEGATIVE

## 2019-01-22 MED ORDER — HYDROMORPHONE HCL 2 MG/ML IJ SOLN
2.0000 mg | INTRAMUSCULAR | Status: AC
  Administered 2019-01-22: 2 mg via INTRAVENOUS
  Filled 2019-01-22: qty 1

## 2019-01-22 MED ORDER — POLYETHYLENE GLYCOL 3350 17 G PO PACK: 17.0000 g | PACK | Freq: Every day | ORAL | Status: DC | PRN

## 2019-01-22 MED ORDER — TACROLIMUS 1 MG PO CAPS
2.0000 mg | ORAL_CAPSULE | Freq: Two times a day (BID) | ORAL | Status: DC
  Administered 2019-01-22 – 2019-01-25 (×5): 2 mg via ORAL
  Filled 2019-01-22 (×7): qty 2

## 2019-01-22 MED ORDER — HYDROMORPHONE HCL 2 MG/ML IJ SOLN: 2.0000 mg | INTRAMUSCULAR | Status: AC

## 2019-01-22 MED ORDER — OXYCODONE HCL 5 MG PO TABS
10.0000 mg | ORAL_TABLET | ORAL | Status: DC
  Administered 2019-01-23 – 2019-01-25 (×12): 10 mg via ORAL
  Filled 2019-01-22 (×12): qty 2

## 2019-01-22 MED ORDER — INFLUENZA VAC SPLIT QUAD 0.5 ML IM SUSY
0.5000 mL | PREFILLED_SYRINGE | INTRAMUSCULAR | Status: DC
  Filled 2019-01-22: qty 0.5

## 2019-01-22 MED ORDER — DIPHENHYDRAMINE HCL 25 MG PO CAPS
25.0000 mg | ORAL_CAPSULE | ORAL | Status: DC | PRN
  Administered 2019-01-22: 25 mg via ORAL
  Filled 2019-01-22: qty 1

## 2019-01-22 MED ORDER — ENOXAPARIN SODIUM 40 MG/0.4ML ~~LOC~~ SOLN
40.0000 mg | SUBCUTANEOUS | Status: DC
  Administered 2019-01-22: 40 mg via SUBCUTANEOUS
  Filled 2019-01-22: qty 0.4

## 2019-01-22 MED ORDER — NALOXONE HCL 0.4 MG/ML IJ SOLN: 0.4000 mg | INTRAMUSCULAR | Status: DC | PRN

## 2019-01-22 MED ORDER — SODIUM CHLORIDE 0.9% FLUSH: 9.0000 mL | INTRAVENOUS | Status: DC | PRN

## 2019-01-22 MED ORDER — SODIUM BICARBONATE 650 MG PO TABS
650.0000 mg | ORAL_TABLET | Freq: Two times a day (BID) | ORAL | Status: DC
  Administered 2019-01-22 – 2019-01-25 (×6): 650 mg via ORAL
  Filled 2019-01-22 (×7): qty 1

## 2019-01-22 MED ORDER — FOLIC ACID 1 MG PO TABS
1.0000 mg | ORAL_TABLET | Freq: Every day | ORAL | Status: DC
  Administered 2019-01-22 – 2019-01-25 (×4): 1 mg via ORAL
  Filled 2019-01-22 (×4): qty 1

## 2019-01-22 MED ORDER — HYDROMORPHONE HCL 2 MG/ML IJ SOLN
2.0000 mg | INTRAMUSCULAR | Status: AC
  Filled 2019-01-22: qty 1

## 2019-01-22 MED ORDER — SODIUM CHLORIDE 0.45 % IV SOLN
INTRAVENOUS | Status: DC
  Administered 2019-01-22 – 2019-01-23 (×2): via INTRAVENOUS

## 2019-01-22 MED ORDER — OXYCODONE HCL 5 MG PO TABS
10.0000 mg | ORAL_TABLET | Freq: Four times a day (QID) | ORAL | Status: DC | PRN
  Administered 2019-01-22: 10 mg via ORAL
  Filled 2019-01-22: qty 2

## 2019-01-22 MED ORDER — HYDROMORPHONE 1 MG/ML IV SOLN
INTRAVENOUS | Status: DC
  Administered 2019-01-22: 3 mg via INTRAVENOUS
  Administered 2019-01-22: 6 mg via INTRAVENOUS
  Administered 2019-01-22: 30 mg via INTRAVENOUS
  Administered 2019-01-22: 1.5 mg via INTRAVENOUS
  Filled 2019-01-22: qty 30

## 2019-01-22 MED ORDER — OXYCODONE HCL ER 15 MG PO T12A
15.0000 mg | EXTENDED_RELEASE_TABLET | Freq: Two times a day (BID) | ORAL | Status: DC
  Administered 2019-01-22 – 2019-01-25 (×6): 15 mg via ORAL
  Filled 2019-01-22 (×6): qty 1

## 2019-01-22 MED ORDER — HYDROXYUREA 500 MG PO CAPS
1500.0000 mg | ORAL_CAPSULE | Freq: Every day | ORAL | Status: DC
  Administered 2019-01-22 – 2019-01-25 (×4): 1500 mg via ORAL
  Filled 2019-01-22 (×4): qty 3

## 2019-01-22 MED ORDER — SENNOSIDES-DOCUSATE SODIUM 8.6-50 MG PO TABS
1.0000 | ORAL_TABLET | Freq: Two times a day (BID) | ORAL | Status: DC
  Administered 2019-01-24: 1 via ORAL
  Filled 2019-01-22 (×6): qty 1

## 2019-01-22 MED ORDER — VITAMIN D 25 MCG (1000 UNIT) PO TABS
2000.0000 [IU] | ORAL_TABLET | Freq: Every day | ORAL | Status: DC
  Administered 2019-01-22 – 2019-01-25 (×4): 2000 [IU] via ORAL
  Filled 2019-01-22 (×4): qty 2

## 2019-01-22 NOTE — ED Notes (Signed)
Pt currently asleep. Pt easily arousable, when asked about pain level pt states pain is unchanged and then pt fell back asleep.

## 2019-01-22 NOTE — H&P (Addendum)
H&P  Patient Demographics:  [name] [name], is a 38 y.o. male  MRN: [id number]   DOB - 13-Jan-1981  Admit Date - 01/22/2019  Outpatient Primary MD for the patient is [name], MD  No chief complaint on file.     HPI:   [name] [name]  is a 38 y.o. male with a medical history significant for sickle cell disease type SS, chronic kidney disease stage 3b, immune complex glomerulonephritis, history of migraine headaches, history of polysubstance abuse, and chronic left foot pain presented to sickle cell day infusion center complaining of generalized pain that is consistent with his typical pain crisis.  Patient was treated and evaluated in the emergency department this a.m., he was appropriate to transition to sickle cell day infusion center for pain management and extended observation.  Patient says the pain is primarily to low back and left foot.  Left foot pain is an ongoing issue.  Patient was evaluated by orthopedic specialist on 01/21/2019 and was advised to report to the ER at that time for further work-up.  Patient attributes pain crisis to running out of opiate medication several days ago.  He admits to taking more medication than prescribed for greater pain control.  Patient also endorses fatigue.  He denies fever, chills, headache, dizziness, paresthesias, urinary symptoms, nausea, vomiting, or diarrhea.  No shortness of breath or chest pain.  No sick contacts, recent travel, or exposure to COVID-19.  Sickle cell day infusion center course: WBCs 14.6, patient remains afebrile.  Hemoglobin 7.5, consistent with patient's baseline.  Serum creatinine is 2.98 on today.  Patient's baseline is 2.1-2.3.  Also, AST 86 and ALT 78.  Total bilirubin elevated at 3.7.  Vital signs reassuring. Pain persists despite IV Dilaudid via PCA and IV fluids.  Patient admitted to [facility] for management of sickle cell pain crisis.    Review of systems:  In addition to the HPI above, patient  reports  Review of Systems  Constitutional: Negative for chills and fever.  HENT: Negative.   Eyes: Negative.   Respiratory: Negative for shortness of breath.   Cardiovascular: Negative.  Negative for chest pain.  Gastrointestinal: Negative.   Genitourinary: Negative.  Negative for dysuria, hematuria and urgency.  Musculoskeletal: Positive for back pain and joint pain.  Skin: Negative.   Neurological: Negative.   Endo/Heme/Allergies: Negative.   Psychiatric/Behavioral: Positive for substance abuse.    A full 10 point Review of Systems was done, except as stated above, all other Review of Systems were negative.  With Past History of the following :   Past Medical History:  Diagnosis Date   Abscess of right iliac fossa 09/24/2018   required Perc Drain 09/24/2018   Arachnoid Cyst of brain bilaterally    "2 really small ones in the back of my head; inside; saw them w/MRI" (09/25/2012)   Bacterial pneumonia ~ 2012   "caught it here in the hospital" (09/25/2012)   Chronic kidney disease    "from my sickle cell" (09/25/2012)   CKD (chronic kidney disease), stage II    Colitis 04/19/2017   CT scan abd/ pelvis   GERD (gastroesophageal reflux disease)    "after I eat alot of spicey foods" (09/25/2012)   Gynecomastia, male 07/10/2012   History of blood transfusion    "always related to sickle cell crisis" (09/25/2012)   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   Noted in noted from Hematology notes at [facility]    "take RX qd to prevent them" (  09/25/2012)   Opioid dependence with withdrawal ([place]) 08/30/2012   Renal insufficiency    Sickle cell anemia (HCC)    Sickle cell crisis ([place]) 09/25/2012   Sickle cell nephropathy ([place]) 07/10/2012   Sinus tachycardia    Tachycardia with heart rate 121-140 beats per minute with ambulation 08/04/2016      Past Surgical History:  Procedure Laterality Date   ABCESS DRAINAGE     CHOLECYSTECTOMY  ~ 2012   COLONOSCOPY N/A  04/23/2017   Procedure: COLONOSCOPY;  Surgeon: Irene Shipper, MD;  Location: [facility];  Service: Endoscopy;  Laterality: N/A;   IR FLUORO GUIDE CV LINE RIGHT  12/17/2016   IR RADIOLOGIST EVAL & MGMT  10/02/2018   IR RADIOLOGIST EVAL & MGMT  10/15/2018   IR REMOVAL TUN CV CATH W/O FL  12/21/2016   IR US GUIDE VASC ACCESS RIGHT  12/17/2016   spleenectomy       Social History:   Social History   Tobacco Use   Smoking status: Current Some Day Smoker    Packs/day: 0.10    Types: Cigarettes   Smokeless tobacco: Never Used   Tobacco comment: 09/25/2012 "I don't buy cigarettes; bum one from friends q now and then"  Substance Use Topics   Alcohol use: Yes    Alcohol/week: 0.0 standard drinks    Frequency: Never    Comment: Once a month     Lives - At home   Family History :   Family History  Problem Relation Age of Onset   Breast cancer Mother      Home Medications:   Prior to Admission medications   Medication Sig Start Date End Date Taking? Authorizing Provider  cholecalciferol (VITAMIN D3) 25 MCG (1000 UT) tablet Take 2,000 Units by mouth daily.    [provider]  folic acid (FOLVITE) 1 MG tablet Take 1 tablet (1 mg total) daily by mouth. 12/22/16   Leana Gamer, MD  hydroxyurea (HYDREA) 500 MG capsule TAKE 3 CAPSULES BY MOUTH EVERY DAY Patient taking differently: Take 1,500 mg by mouth daily.  12/24/18   [name], FNP  nortriptyline (PAMELOR) 25 MG capsule Take 1 capsule (25 mg total) by mouth at bedtime as needed for sleep. 06/10/18   Donnamae Jude, MD  oxyCODONE (OXYCONTIN) 15 mg 12 hr tablet Take 1 tablet (15 mg total) by mouth every 12 (twelve) hours. 12/31/18   [name], FNP  Oxycodone HCl 10 MG TABS Take 1 tablet (10 mg total) by mouth every 6 (six) hours as needed. 12/31/18   [name], FNP  sodium bicarbonate 650 MG tablet Take 650 mg by mouth 2 (two) times daily. 11/07/18   [provider]  tacrolimus (PROGRAF) 1 MG  capsule Take 2 mg by mouth 2 (two) times daily. 11/12/18   [provider]     Allergies:   Allergies  Allergen Reactions   Nsaids Other (See Comments)    Kidney disease   Morphine And Related Other (See Comments)    "Real bad headaches"      Physical Exam:   Vitals:   Vitals:   01/22/19 1047  BP: 112/75  Pulse: 85  Resp: 18  Temp: 97.9 F (36.6 C)  SpO2: 97%    Physical Exam: Constitutional: Patient appears well-developed and well-nourished. Not in obvious distress. HENT: Normocephalic, atraumatic, External right and left ear normal. Oropharynx is clear and moist.  Eyes: Conjunctivae and EOM are normal. PERRLA, no scleral  icterus. Neck: Normal ROM. Neck supple. No JVD. No tracheal deviation. No thyromegaly. CVS: RRR, S1/S2 +, no murmurs, no gallops, no carotid bruit.  Pulmonary: Effort and breath sounds normal, no stridor, rhonchi, wheezes, rales.  Abdominal: Soft. BS +, no distension, tenderness, rebound or guarding.  Musculoskeletal: Normal range of motion. No edema and no tenderness.  Lymphadenopathy: No lymphadenopathy noted, cervical, inguinal or axillary Neuro: Alert. Normal reflexes, muscle tone coordination. No cranial nerve deficit. Skin: Skin is warm and dry. No rash noted. Not diaphoretic. No erythema. No pallor. Psychiatric: Normal mood and affect. Behavior, judgment, thought content normal.   Data Review:   CBC Recent Labs  Lab 01/21/19 2353  WBC 14.6*  HGB 7.5*  HCT 21.7*  PLT 185  MCV 110.7*  MCH 38.3*  MCHC 34.6  RDW Not Measured  LYMPHSABS 3.6  MONOABS 1.9*  EOSABS 0.2  BASOSABS 0.1   ------------------------------------------------------------------------------------------------------------------  Chemistries  Recent Labs  Lab 01/21/19 2353  NA 136  K 4.1  CL 113*  CO2 15*  GLUCOSE 108*  BUN 24*  CREATININE 2.98*  CALCIUM 9.1  AST 86*  ALT 78*  ALKPHOS 270*  BILITOT 3.7*    ------------------------------------------------------------------------------------------------------------------ estimated creatinine clearance is 27.6 mL/min (A) (by C-G formula based on SCr of 2.98 mg/dL (H)). ------------------------------------------------------------------------------------------------------------------ No results for input(s): TSH, T4TOTAL, T3FREE, THYROIDAB in the last 72 hours.  Invalid input(s): FREET3  Coagulation profile No results for input(s): INR, PROTIME in the last 168 hours. ------------------------------------------------------------------------------------------------------------------- No results for input(s): DDIMER in the last 72 hours. -------------------------------------------------------------------------------------------------------------------  Cardiac Enzymes No results for input(s): CKMB, TROPONINI, MYOGLOBIN in the last 168 hours.  Invalid input(s): CK ------------------------------------------------------------------------------------------------------------------ No results found for: BNP  ---------------------------------------------------------------------------------------------------------------  Urinalysis    Component Value Date/Time   COLORURINE STRAW (A) 09/23/2018 1522   APPEARANCEUR HAZY (A) 09/23/2018 1522   LABSPEC 1.009 09/23/2018 1522   PHURINE 7.0 09/23/2018 1522   GLUCOSEU 50 (A) 09/23/2018 1522   HGBUR NEGATIVE 09/23/2018 1522   HGBUR small 03/10/2010 1445   BILIRUBINUR neg 12/10/2018 1139   KETONESUR NEGATIVE 09/23/2018 1522   PROTEINUR Positive (A) 12/10/2018 1139   PROTEINUR >=300 (A) 09/23/2018 1522   UROBILINOGEN 0.2 12/10/2018 1139   UROBILINOGEN 0.2 05/11/2017 1335   NITRITE neg 12/10/2018 1139   NITRITE NEGATIVE 09/23/2018 1522   LEUKOCYTESUR Negative 12/10/2018 1139   LEUKOCYTESUR SMALL (A) 09/23/2018 1522     ----------------------------------------------------------------------------------------------------------------   Imaging Results:    Dg Chest Portable 1 View  Result Date: 01/21/2019 CLINICAL DATA:  Sickle cell crisis and fevers EXAM: PORTABLE CHEST 1 VIEW COMPARISON:  11/30/2018 FINDINGS: Cardiac shadows within normal limits. The lungs are well aerated bilaterally. No focal infiltrate or sizable effusion is seen. No acute bony abnormality is noted. Chronic changes the right humeral head seen consistent with avascular necrosis. IMPRESSION: No acute abnormality noted. Electronically Signed   By: Inez Catalina M.D.   On: 01/21/2019 23:16      Assessment & Plan:  Principal Problem:   Sickle cell pain crisis ([place]) Active Problems:   TOBACCO ABUSE   Acute-on-chronic kidney injury ([place])   Polysubstance abuse (HCC)   Leukocytosis   Transaminitis   Chronic pain syndrome   Renal insufficiency  Sickle cell disease with pain crisis: Pain persists despite IV Dilaudid via PCA.  Admit to MedSurg. Continue PCA Dilaudid with settings of 0.5 mg, 10-minute lockout, and 3 mg/h Gentle hydration, 0.45% saline at 50 mL/h Reevaluate pain scale regularly and monitor vital signs closely Pain  intensity will be reevaluated in context of functioning in relationship to baseline as patient's care progresses  Chronic pain syndrome: Continue OxyContin 15 mg every 12 hours Oxycodone 15 mg every 6 hours as needed for severe, breakthrough pain  Leukocytosis: WBCs 14.6.  Patient afebrile.  No signs of infection or inflammation.  Continue to monitor without antibiotics.  Repeat CBC in a.m.  Sickle cell anemia: Hemoglobin 7.5, consistent with patient's baseline.  No clinical indication for blood transfusion at this time.  Patient's baseline is 7.0-8.0 g/dL.  He is typically not transfused unless hemoglobin is less than 7. Continue folic acid and hydroxyurea.  Acute on chronic kidney disease: Potassium 2.8,  which is increased from patient's baseline.  Patient is followed by nephrology as an outpatient.  His baseline is typically 2.1-2.3.  Will follow closely.  Gentle hydration.  Avoid all nephrotoxic medications.  History of polysubstance abuse: Patient previously had UDS that was positive for cocaine and cannabinoids.  Currently pending, will review as results become available.  Tobacco dependence: Patient counseled at length.  He is not ready to quit smoking.  He is not interested in nicotine patch, declines at this time. DVT Prophylaxis: Subcut Lovenox   AM Labs Ordered, also please review Full Orders  Family Communication: Admission, patient's condition and plan of care including tests being ordered have been discussed with the patient who indicate understanding and agree with the plan and Code Status.  Code Status: Full Code  Consults called: None    Admission status: Inpatient    Time spent in minutes : 50 minutes   [place], MSN, FNP-C [facility] [address] [place], [place] [address] [phone]  01/22/2019 at 12:55 PM   This note was prepared using Dragon speech recognition software, errors in dictation are unintentional.

## 2019-01-22 NOTE — Progress Notes (Signed)
  [name] [name], a 38 year old male with a medical history significant for sickle cell disease type SS, chronic pain syndrome, opiate dependence and tolerance, history of anemia of chronic disease, chronic kidney disease stage IV, and history of polysubstance abuse was admitted for sickle cell pain crisis. Reviewed labs.  UDS positive for illicit substances including cocaine and cannabinoids.. Discontinued IV dilaudid PCA. Will continue Oxycodone 10 mg every 4 hours while awake and scheduled oxycodone. Social work will be consulted in am.  Refrain from IV opiate medications during this admission.  [name]  APRN, MSN, FNP-C [facility] [address] [place], [place] [address] [phone]

## 2019-01-22 NOTE — H&P (Addendum)
[facility] History and Physical   Date: 01/22/2019  Patient name: [name] [name]. Medical record number: [id number] Date of birth: 12/10/1980 Age: 38 y.o. Gender: male PCP: [name], MD  Attending physician: [name], MD  Chief Complaint: Sickle cell pain  History of Present Illness: Sylvestre Drotar, a 38 year old male with a medical history significant for sickle cell disease type SS, chronic kidney disease, immune complex glomerulonephritis, history of migraine headaches, and chronic left foot pain presents complaining of generalized pain that is consistent with his typical pain crisis.  Patient was evaluated in the emergency department this a.m. for this problem.  Received report from ER provider, agreed that patient is appropriate to transition to clinic for pain management and extended observation.  Patient says the pain is primarily to left foot, which is an ongoing issue.  He was seen by orthopedic specialist on 01/21/2019 and was advised to report to the ER for further work-up and evaluation.  Patient says that he ran out of pain meds several days ago.  He admits to taking more medication than prescribed for greater pain control.  Patient endorses fatigue.  He denies fever, chills, headache, dizziness, paresthesias, urinary symptoms, nausea, vomiting, or diarrhea.  No shortness of breath or chest pain.  No sick contacts, recent travel, or exposure to COVID-19.  Meds: Medications Prior to Admission  Medication Sig Dispense Refill Last Dose  . cholecalciferol (VITAMIN D3) 25 MCG (1000 UT) tablet Take 2,000 Units by mouth daily.     . folic acid (FOLVITE) 1 MG tablet Take 1 tablet (1 mg total) daily by mouth. 30 tablet 2   . hydroxyurea (HYDREA) 500 MG capsule TAKE 3 CAPSULES BY MOUTH EVERY DAY (Patient taking differently: Take 1,500 mg by mouth daily. ) 270 capsule 1   . nortriptyline (PAMELOR) 25 MG capsule Take 1 capsule (25 mg total) by  mouth at bedtime as needed for sleep. 90 capsule 3   . oxyCODONE (OXYCONTIN) 15 mg 12 hr tablet Take 1 tablet (15 mg total) by mouth every 12 (twelve) hours. 60 tablet 0   . Oxycodone HCl 10 MG TABS Take 1 tablet (10 mg total) by mouth every 6 (six) hours as needed. 60 tablet 0   . sodium bicarbonate 650 MG tablet Take 650 mg by mouth 2 (two) times daily.     . tacrolimus (PROGRAF) 1 MG capsule Take 2 mg by mouth 2 (two) times daily.       Allergies: Nsaids and Morphine and related Past Medical History:  Diagnosis Date  . Abscess of right iliac fossa 09/24/2018   required Perc Drain 09/24/2018  . Arachnoid Cyst of brain bilaterally    "2 really small ones in the back of my head; inside; saw them w/MRI" (09/25/2012)  . Bacterial pneumonia ~ 2012   "caught it here in the hospital" (09/25/2012)  . Chronic kidney disease    "from my sickle cell" (09/25/2012)  . CKD (chronic kidney disease), stage II   . Colitis 04/19/2017   CT scan abd/ pelvis  . GERD (gastroesophageal reflux disease)    "after I eat alot of spicey foods" (09/25/2012)  . Gynecomastia, male 07/10/2012  . History of blood transfusion    "always related to sickle cell crisis" (09/25/2012)  . 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   Noted in noted from Hematology notes at [facility]  . Migraines    "take RX qd to prevent them" (09/25/2012)  .  Opioid dependence with withdrawal ([place]) 08/30/2012  . Renal insufficiency   . Sickle cell anemia (HCC)   . Sickle cell crisis ([place]) 09/25/2012  . Sickle cell nephropathy ([place]) 07/10/2012  . Sinus tachycardia   . Tachycardia with heart rate 121-140 beats per minute with ambulation 08/04/2016   Past Surgical History:  Procedure Laterality Date  . ABCESS DRAINAGE    . CHOLECYSTECTOMY  ~ 2012  . COLONOSCOPY N/A 04/23/2017   Procedure: COLONOSCOPY;  Surgeon: Irene Shipper, MD;  Location: [facility];  Service: Endoscopy;  Laterality: N/A;  . IR FLUORO GUIDE CV LINE RIGHT   12/17/2016  . IR RADIOLOGIST EVAL & MGMT  10/02/2018  . IR RADIOLOGIST EVAL & MGMT  10/15/2018  . IR REMOVAL TUN CV CATH W/O FL  12/21/2016  . IR US GUIDE VASC ACCESS RIGHT  12/17/2016  . spleenectomy     Family History  Problem Relation Age of Onset  . Breast cancer Mother    Social History   Socioeconomic History  . Marital status: Single    Spouse name: Not on file  . Number of children: Not on file  . Years of education: Not on file  . Highest education level: Not on file  Occupational History  . Not on file  Social Needs  . Financial resource strain: Not on file  . Food insecurity    Worry: Not on file    Inability: Not on file  . Transportation needs    Medical: Not on file    Non-medical: Not on file  Tobacco Use  . Smoking status: Current Some Day Smoker    Packs/day: 0.10    Types: Cigarettes  . Smokeless tobacco: Never Used  . Tobacco comment: 09/25/2012 "I don't buy cigarettes; bum one from friends q now and then"  Substance and Sexual Activity  . Alcohol use: Yes    Alcohol/week: 0.0 standard drinks    Frequency: Never    Comment: Once a month  . Drug use: Not Currently    Types: Marijuana    Comment: Every 2-3 weeks   . Sexual activity: Yes  Lifestyle  . Physical activity    Days per week: Not on file    Minutes per session: Not on file  . Stress: Not on file  Relationships  . Social Herbalist on phone: Not on file    Gets together: Not on file    Attends religious service: Not on file    Active member of club or organization: Not on file    Attends meetings of clubs or organizations: Not on file    Relationship status: Not on file  . Intimate partner violence    Fear of current or ex partner: Not on file    Emotionally abused: Not on file    Physically abused: Not on file    Forced sexual activity: Not on file  Other Topics Concern  . Not on file  Social History Narrative    Lives alone in [place].   Back at school, studying  Public relations account executive. Unemployed.    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   Review of Systems  Constitutional: Positive for malaise/fatigue.  HENT: Negative.   Eyes: Negative.   Respiratory: Negative.   Cardiovascular: Negative.   Gastrointestinal: Negative for nausea and vomiting.  Genitourinary:  Negative for dysuria, frequency, hematuria and urgency.  Musculoskeletal: Positive for back pain and joint pain.  Skin: Negative.   Neurological: Negative.   Psychiatric/Behavioral: Negative.     Physical Exam: There were no vitals taken for this visit.   Physical Exam Constitutional:      Appearance: Normal appearance.  HENT:     Nose: Nose normal.     Mouth/Throat:     Mouth: Mucous membranes are moist.     Pharynx: Oropharynx is clear.  Eyes:     Pupils: Pupils are equal, round, and reactive to light.  Cardiovascular:     Rate and Rhythm: Normal rate and regular rhythm.     Pulses: Normal pulses.     Heart sounds: Normal heart sounds.  Pulmonary:     Effort: Pulmonary effort is normal.     Breath sounds: Normal breath sounds.  Abdominal:     General: Abdomen is flat. Bowel sounds are normal.  Musculoskeletal: Normal range of motion.  Skin:    General: Skin is warm and dry.  Neurological:     General: No focal deficit present.     Mental Status: He is alert. Mental status is at baseline.  Psychiatric:        Mood and Affect: Mood normal.        Behavior: Behavior normal.        Thought Content: Thought content normal.        Judgment: Judgment normal.     Lab results: Results for orders placed or performed during the hospital encounter of 01/21/19 (from the past 24 hour(s))  Comprehensive metabolic panel     Status: Abnormal   Collection Time: 01/21/19 11:53 PM  Result Value Ref Range   Sodium 136 135 - 145 mmol/L    Potassium 4.1 3.5 - 5.1 mmol/L   Chloride 113 (H) 98 - 111 mmol/L   CO2 15 (L) 22 - 32 mmol/L   Glucose, Bld 108 (H) 70 - 99 mg/dL   BUN 24 (H) 6 - 20 mg/dL   Creatinine, Ser 2.98 (H) 0.61 - 1.24 mg/dL   Calcium 9.1 8.9 - 10.3 mg/dL   Total Protein 7.1 6.5 - 8.1 g/dL   Albumin 3.4 (L) 3.5 - 5.0 g/dL   AST 86 (H) 15 - 41 U/L   ALT 78 (H) 0 - 44 U/L   Alkaline Phosphatase 270 (H) 38 - 126 U/L   Total Bilirubin 3.7 (H) 0.3 - 1.2 mg/dL   GFR calc non Af Amer 26 (L) >60 mL/min   GFR calc Af Amer 30 (L) >60 mL/min   Anion gap 8 5 - 15  CBC with Differential     Status: Abnormal   Collection Time: 01/21/19 11:53 PM  Result Value Ref Range   WBC 14.6 (H) 4.0 - 10.5 K/uL   RBC 1.96 (L) 4.22 - 5.81 MIL/uL   Hemoglobin 7.5 (L) 13.0 - 17.0 g/dL   HCT 21.7 (L) 39.0 - 52.0 %   MCV 110.7 (H) 80.0 - 100.0 fL   MCH 38.3 (H) 26.0 - 34.0 pg   MCHC 34.6 30.0 - 36.0 g/dL   RDW Not Measured 11.5 - 15.5 %   Platelets 185 150 - 400 K/uL   nRBC 1.0 (H) 0.0 - 0.2 %   Neutrophils Relative % 59 %   Neutro Abs 8.6 (H) 1.7 - 7.7 K/uL   Lymphocytes Relative 25 %   Lymphs Abs 3.6 0.7 - 4.0 K/uL   Monocytes Relative  13 %   Monocytes Absolute 1.9 (H) 0.1 - 1.0 K/uL   Eosinophils Relative 1 %   Eosinophils Absolute 0.2 0.0 - 0.5 K/uL   Basophils Relative 0 %   Basophils Absolute 0.1 0.0 - 0.1 K/uL   Immature Granulocytes 2 %   Abs Immature Granulocytes 0.26 (H) 0.00 - 0.07 K/uL   Pappenheimer Bodies PRESENT    Tammy Sours Bodies PRESENT    Polychromasia PRESENT    Sickle Cells PRESENT    Target Cells PRESENT   Reticulocytes     Status: Abnormal   Collection Time: 01/21/19 11:53 PM  Result Value Ref Range   Retic Ct Pct 13.1 (H) 0.4 - 3.1 %   RBC. 1.96 (L) 4.22 - 5.81 MIL/uL   Retic Count, Absolute 255.8 (H) 19.0 - 186.0 K/uL   Immature Retic Fract 36.8 (H) 2.3 - 15.9 %    Imaging results:  Dg Chest Portable 1 View  Result Date: 01/21/2019 CLINICAL DATA:  Sickle cell crisis and fevers EXAM:  PORTABLE CHEST 1 VIEW COMPARISON:  11/30/2018 FINDINGS: Cardiac shadows within normal limits. The lungs are well aerated bilaterally. No focal infiltrate or sizable effusion is seen. No acute bony abnormality is noted. Chronic changes the right humeral head seen consistent with avascular necrosis. IMPRESSION: No acute abnormality noted. Electronically Signed   By: Inez Catalina M.D.   On: 01/21/2019 23:16     Assessment & Plan: Patient admitted to sickle cell day infusion center for pain management and extended observation. Reviewed laboratory values from ER.  Creatinine 2.8, which is increased from baseline.  Initiate IV fluids, 0.45% saline at 50 mL/h.  Patient warrants gentle hydration at this juncture.  We will hold Toradol, avoid all nephrotoxic medications. Initiate IV Dilaudid via PCA per weight-based protocol.  Settings of 0.5 mg, 10-minute lockout, and 3 mg/h. Patient has a history of polysubstance abuse, urine drug screen pending.  Patient says that he is unable to provide sample at this time. Patient's baseline hemoglobin is 7.0-8.0 g/dL.  He is typically not transfused unless hemoglobin is less than 7.0.  There is no clinical indication for blood transfusion at this time.  WBCs 14.6, suspected to be reactive.  There are no signs of infection or inflammation.  No antibiotics warranted at this time. Pain intensity will be reevaluated in context of functioning in relationship to baseline as patient's care progresses. If pain intensity remains elevated or hemodynamic stability changes, transition to inpatient services for higher level of care.    [name]  APRN, MSN, FNP-C [facility] [address] [place], [place] [address] [phone]  01/22/2019, 10:15 AM

## 2019-01-22 NOTE — ED Notes (Signed)
Pt stated unable to urinate at this time, will monitor.

## 2019-01-22 NOTE — Discharge Instructions (Addendum)
Being transported to [facility].

## 2019-01-22 NOTE — ED Notes (Addendum)
Patient was given discharge paperwork. Patient verbalized understanding to go to [facility].  Patient wants to be admitted to hospital. This writer attempted to educate patient on plan of care.  Patient was unable to sign for discharge but verbalized understanding.

## 2019-01-22 NOTE — ED Notes (Signed)
Left bedside urinal. Pt states he is unable to provide urine sample at the current time.

## 2019-01-23 DIAGNOSIS — N289 Disorder of kidney and ureter, unspecified: Secondary | ICD-10-CM

## 2019-01-23 DIAGNOSIS — F1910 Other psychoactive substance abuse, uncomplicated: Secondary | ICD-10-CM | POA: Diagnosis not present

## 2019-01-23 DIAGNOSIS — N179 Acute kidney failure, unspecified: Secondary | ICD-10-CM | POA: Diagnosis not present

## 2019-01-23 DIAGNOSIS — D5700 Hb-SS disease with crisis, unspecified: Secondary | ICD-10-CM | POA: Diagnosis not present

## 2019-01-23 DIAGNOSIS — F17200 Nicotine dependence, unspecified, uncomplicated: Secondary | ICD-10-CM

## 2019-01-23 LAB — URINE CULTURE: Culture: <10000 — AB

## 2019-01-23 LAB — BASIC METABOLIC PANEL
Anion gap: 9 (ref 5–15)
BUN: 28 mg/dL — ABNORMAL HIGH (ref 6–20)
CO2: 14 mmol/L — ABNORMAL LOW (ref 22–32)
Calcium: 8.6 mg/dL — ABNORMAL LOW (ref 8.9–10.3)
Chloride: 111 mmol/L (ref 98–111)
Creatinine, Ser: 2.72 mg/dL — ABNORMAL HIGH (ref 0.61–1.24)
GFR calc Af Amer: 33 mL/min — ABNORMAL LOW (ref >60)
GFR calc non Af Amer: 29 mL/min — ABNORMAL LOW (ref >60)
Glucose, Bld: 92 mg/dL (ref 70–99)
Potassium: 4.4 mmol/L (ref 3.5–5.1)
Sodium: 134 mmol/L — ABNORMAL LOW (ref 135–145)

## 2019-01-23 LAB — CBC
HCT: 19.4 % — ABNORMAL LOW (ref 39.0–52.0)
Hemoglobin: 6.6 g/dL — CL (ref 13.0–17.0)
MCH: 37.7 pg — ABNORMAL HIGH (ref 26.0–34.0)
MCHC: 34 g/dL (ref 30.0–36.0)
MCV: 110.9 fL — ABNORMAL HIGH (ref 80.0–100.0)
Platelets: 204 10*3/uL (ref 150–400)
RBC: 1.75 MIL/uL — ABNORMAL LOW (ref 4.22–5.81)
WBC: 17.1 10*3/uL — ABNORMAL HIGH (ref 4.0–10.5)
nRBC: 1.9 % — ABNORMAL HIGH (ref 0.0–0.2)

## 2019-01-23 LAB — PREPARE RBC (CROSSMATCH)

## 2019-01-23 MED ORDER — ACETAMINOPHEN 325 MG PO TABS
650.0000 mg | ORAL_TABLET | Freq: Once | ORAL | Status: AC
  Administered 2019-01-23: 650 mg via ORAL
  Filled 2019-01-23: qty 2

## 2019-01-23 MED ORDER — SODIUM CHLORIDE 0.9% IV SOLUTION
Freq: Once | INTRAVENOUS | Status: AC
  Administered 2019-01-23: 18:00:00 via INTRAVENOUS

## 2019-01-23 MED ORDER — DIPHENHYDRAMINE HCL 50 MG/ML IJ SOLN
25.0000 mg | Freq: Once | INTRAMUSCULAR | Status: AC
  Administered 2019-01-23: 25 mg via INTRAVENOUS
  Filled 2019-01-23: qty 1

## 2019-01-23 MED ORDER — ENOXAPARIN SODIUM 30 MG/0.3ML ~~LOC~~ SOLN
30.0000 mg | SUBCUTANEOUS | Status: DC
  Filled 2019-01-23 (×2): qty 0.3

## 2019-01-23 MED ORDER — HYDROMORPHONE HCL 2 MG/ML IJ SOLN
2.0000 mg | Freq: Once | INTRAMUSCULAR | Status: AC
  Administered 2019-01-23: 2 mg via SUBCUTANEOUS
  Filled 2019-01-23: qty 1

## 2019-01-23 NOTE — Progress Notes (Signed)
Subjective: [name] [name] is a 37 year old male with a medical history significant for sickle cell disease type SS, chronic kidney disease, immune complex glomerulonephritis, history of migraine headaches, history of polysubstance abuse, and chronic left foot pain was admitted for sickle cell pain crisis.  Patient states the pain intensity is 8/10 primarily to lower extremities.  Patient characterizes pain as constant and throbbing.  Patient denies headache, chest pain, shortness of breath, dizziness, urinary symptoms, nausea, vomiting, or diarrhea.  Objective:  Vital signs in last 24 hours:  Vitals:   01/22/19 1954 01/22/19 2139 01/23/19 0207 01/23/19 0300  BP: 120/76  106/75   Pulse: (!) 108  (!) 101   Resp: 18 16  18   Temp: 98.7 F (37.1 C)  98.4 F (36.9 C)   TempSrc: Oral  Oral   SpO2: 91% 93% 90%     Intake/Output from previous day:   Intake/Output Summary (Last 24 hours) at 01/23/2019 0955 Last data filed at 01/23/2019 0806 Gross per 24 hour  Intake 1319.16 ml  Output 1 ml  Net 1318.16 ml    Physical Exam: General: Alert, awake, oriented x3, in no acute distress.  HEENT: [place]/AT PEERL, EOMI Neck: Trachea midline,  no masses, no thyromegal,y no JVD, no carotid bruit OROPHARYNX:  Moist, No exudate/ erythema/lesions.  Heart: Regular rate and rhythm, without murmurs, rubs, gallops, PMI non-displaced, no heaves or thrills on palpation.  Lungs: Clear to auscultation, no wheezing or rhonchi noted. No increased vocal fremitus resonant to percussion  Abdomen: Soft, nontender, nondistended, positive bowel sounds, no masses no hepatosplenomegaly noted..  Neuro: No focal neurological deficits noted cranial nerves II through XII grossly intact. DTRs 2+ bilaterally upper and lower extremities. Strength 5 out of 5 in bilateral upper and lower extremities. Musculoskeletal: No warm swelling or erythema around joints, no spinal tenderness noted. Psychiatric: Patient alert and  oriented x3, good insight and cognition, good recent to remote recall. Lymph node survey: No cervical axillary or inguinal lymphadenopathy noted.  Lab Results:  Basic Metabolic Panel:    Component Value Date/Time   NA 136 01/21/2019 2353   NA 142 10/01/2018 1430   K 4.1 01/21/2019 2353   CL 113 (H) 01/21/2019 2353   CO2 15 (L) 01/21/2019 2353   BUN 24 (H) 01/21/2019 2353   BUN 42 (H) 10/01/2018 1430   CREATININE 2.98 (H) 01/21/2019 2353   CREATININE 1.45 (H) 05/12/2014 1430   GLUCOSE 108 (H) 01/21/2019 2353   CALCIUM 9.1 01/21/2019 2353   CBC:    Component Value Date/Time   WBC 14.6 (H) 01/21/2019 2353   HGB 7.5 (L) 01/21/2019 2353   HGB 6.7 (LL) 12/24/2018 1352   HCT 21.7 (L) 01/21/2019 2353   HCT 19.4 (L) 12/24/2018 1352   PLT 185 01/21/2019 2353   PLT 243 12/24/2018 1352   MCV 110.7 (H) 01/21/2019 2353   MCV 112 (H) 12/24/2018 1352   NEUTROABS 8.6 (H) 01/21/2019 2353   NEUTROABS 5.0 12/24/2018 1352   LYMPHSABS 3.6 01/21/2019 2353   LYMPHSABS 0.8 12/24/2018 1352   MONOABS 1.9 (H) 01/21/2019 2353   EOSABS 0.2 01/21/2019 2353   EOSABS 0.0 12/24/2018 1352   BASOSABS 0.1 01/21/2019 2353   BASOSABS 0.0 12/24/2018 1352    Recent Results (from the past 240 hour(s))  SARS CORONAVIRUS 2 (TAT 6-24 HRS) Nasopharyngeal Nasopharyngeal Swab     Status: None   Collection Time: 01/22/19  1:53 PM   Specimen: Nasopharyngeal Swab  Result Value Ref Range Status   SARS  Coronavirus 2 NEGATIVE NEGATIVE Final    Comment: (NOTE) SARS-CoV-2 target nucleic acids are NOT DETECTED. The SARS-CoV-2 RNA is generally detectable in upper and lower respiratory specimens during the acute phase of infection. Negative results do not preclude SARS-CoV-2 infection, do not rule out co-infections with other pathogens, and should not be used as the sole basis for treatment or other patient management decisions. Negative results must be combined with clinical observations, patient history, and  epidemiological information. The expected result is Negative. Fact Sheet for Patients: SugarRoll.be Fact Sheet for Healthcare Providers: https://www.woods-mathews.com/ This test is not yet approved or cleared by the [place] FDA and  has been authorized for detection and/or diagnosis of SARS-CoV-2 by FDA under an Emergency Use Authorization (EUA). This EUA will remain  in effect (meaning this test can be used) for the duration of the COVID-19 declaration under Section 56 4(b)(1) of the Act, 21 U.S.C. section 360bbb-3(b)(1), unless the authorization is terminated or revoked sooner. Performed at [facility], [place] [address], [place], [place] [address]     Studies/Results: DG Chest Portable 1 View  Result Date: 01/21/2019 CLINICAL DATA:  Sickle cell crisis and fevers EXAM: PORTABLE CHEST 1 VIEW COMPARISON:  11/30/2018 FINDINGS: Cardiac shadows within normal limits. The lungs are well aerated bilaterally. No focal infiltrate or sizable effusion is seen. No acute bony abnormality is noted. Chronic changes the right humeral head seen consistent with avascular necrosis. IMPRESSION: No acute abnormality noted. Electronically Signed   By: Inez Catalina M.D.   On: 01/21/2019 23:16    Medications: Scheduled Meds: . cholecalciferol  2,000 Units Oral Daily  . enoxaparin (LOVENOX) injection  30 mg Subcutaneous Q24H  . folic acid  1 mg Oral Daily  . hydroxyurea  1,500 mg Oral Daily  . influenza vac split quadrivalent PF  0.5 mL Intramuscular Tomorrow-1000  . oxyCODONE  10 mg Oral Q4H while awake  . oxyCODONE  15 mg Oral Q12H  . senna-docusate  1 tablet Oral BID  . sodium bicarbonate  650 mg Oral BID  . tacrolimus  2 mg Oral BID   Continuous Infusions: . sodium chloride 50 mL/hr at 01/23/19 0806   PRN Meds:.polyethylene glycol  Consultants:  None  Procedures:  None  Antibiotics:  None  Assessment/Plan: Principal Problem:    Sickle cell pain crisis ([place]) Active Problems:   TOBACCO ABUSE   Acute-on-chronic kidney injury ([place])   Polysubstance abuse (H[facility])   Leukocytosis   Transaminitis   Chronic pain syndrome   Renal insufficiency  Sickle cell disease with pain crisis: Patient's IV Dilaudid PCA was discontinued on 01/22/2019.  OxyContin 15 mg every 12 hours and oxycodone 15 mg every 4 hours while awake. Refrain from IV pain medications, especially IV Dilaudid clinician assisted boluses. Reevaluate pain scale regularly and monitor vital signs closely.  Chronic pain syndrome: Continue home medications  Leukocytosis: Stable.  Patient afebrile.  No signs of infection or inflammation.  Continue to follow without antibiotics.  Repeat CBC in a.m.  Sickle cell anemia: Patient refused labs. No clinical indication for blood transfusion at this time. Will continue to monitor closely.   Acute on chronic kidney disease: Patient refused labs this a.m.  Discussed the importance of following care plan in order to achieve the best outcomes.  On yesterday, patient's creatinine was 2.8, which is increased from his baseline.  Will repeat BMP.  Continue to avoid all nephrotoxic medications.  Gentle hydration.  History of polysubstance abuse: UDS positive for cocaine.  Patient states that he does not know how cocaine got into his system.  He admits to smoking marijuana.  Patient is not interested and drug rehabilitation.  He says that his marijuana may have been "laced with cocaine" without his knowledge.  Tobacco dependence: Patient counseled at length on admission.  He is not ready to quit smoking.  Not interested in nicotine patch at this time.   Code Status: Full Code Family Communication: N/A Disposition Plan: Not yet ready for discharge   [place], MSN, FNP-C [facility] [address] [place], [place] [address] [phone]  If 5PM-7AM, please contact  night-coverage.  01/23/2019, 9:55 AM  LOS: 1 day

## 2019-01-23 NOTE — Progress Notes (Signed)
CRITICAL VALUE ALERT  Critical Value:  Hemoglobin 6.6  Date & Time Notied:  01/23/2019 at 1532  Provider Notified: [place] Hollis, NP  Orders Received/Actions taken: awaiting orders

## 2019-01-24 ENCOUNTER — Inpatient Hospital Stay (HOSPITAL_COMMUNITY): Payer: Medicare Other

## 2019-01-24 DIAGNOSIS — R079 Chest pain, unspecified: Secondary | ICD-10-CM

## 2019-01-24 LAB — TYPE AND SCREEN
ABO/RH(D): O POS
Antibody Screen: NEGATIVE
Donor AG Type: NEGATIVE
Unit division: 0

## 2019-01-24 LAB — BASIC METABOLIC PANEL
Anion gap: 10 (ref 5–15)
BUN: 29 mg/dL — ABNORMAL HIGH (ref 6–20)
CO2: 14 mmol/L — ABNORMAL LOW (ref 22–32)
Calcium: 8.8 mg/dL — ABNORMAL LOW (ref 8.9–10.3)
Chloride: 111 mmol/L (ref 98–111)
Creatinine, Ser: 2.74 mg/dL — ABNORMAL HIGH (ref 0.61–1.24)
GFR calc Af Amer: 33 mL/min — ABNORMAL LOW (ref >60)
GFR calc non Af Amer: 28 mL/min — ABNORMAL LOW (ref >60)
Glucose, Bld: 92 mg/dL (ref 70–99)
Potassium: 4.5 mmol/L (ref 3.5–5.1)
Sodium: 135 mmol/L (ref 135–145)

## 2019-01-24 LAB — BPAM RBC
Blood Product Expiration Date: 202101112359
ISSUE DATE / TIME: 202012101824
Unit Type and Rh: 5100

## 2019-01-24 LAB — HEMOGLOBIN AND HEMATOCRIT, BLOOD
HCT: 28.2 % — ABNORMAL LOW (ref 39.0–52.0)
Hemoglobin: 9.7 g/dL — ABNORMAL LOW (ref 13.0–17.0)

## 2019-01-24 LAB — ECHOCARDIOGRAM COMPLETE

## 2019-01-24 LAB — TROPONIN I (HIGH SENSITIVITY)
Troponin I (High Sensitivity): 55 ng/L — ABNORMAL HIGH (ref <18)
Troponin I (High Sensitivity): 54 ng/L — ABNORMAL HIGH (ref <18)

## 2019-01-24 MED ORDER — NITROGLYCERIN 0.4 MG SL SUBL: 0.4000 mg | SUBLINGUAL_TABLET | SUBLINGUAL | Status: DC | PRN

## 2019-01-24 MED ORDER — HYDROMORPHONE HCL 1 MG/ML IJ SOLN
1.0000 mg | Freq: Once | INTRAMUSCULAR | Status: AC
  Administered 2019-01-24: 1 mg via INTRAVENOUS
  Filled 2019-01-24 (×2): qty 1

## 2019-01-24 MED ORDER — HYDROMORPHONE HCL 1 MG/ML IJ SOLN: 1.0000 mg | Freq: Four times a day (QID) | INTRAMUSCULAR | Status: DC | PRN

## 2019-01-24 MED ORDER — SODIUM CHLORIDE 0.45 % IV SOLN
INTRAVENOUS | Status: DC
  Administered 2019-01-24: 16:00:00 via INTRAVENOUS

## 2019-01-24 NOTE — Progress Notes (Addendum)
Subjective: [name] [name], a 38 year old male with a medical history significant for sickle cell disease type SS, chronic kidney disease stage 3b, immune complex glomerulonephritis, history of migraine headaches, and history of polysubstance abuse including cocaine use, and chronic left foot pain was admitted for sickle cell pain crisis.  Patient says that he experienced chest pains, primarily mid chest this a.m.  He says that chest pain is mostly resolved.  Pain intensity is 6/10 also to low back and lower extremities.  He characterizes pain as intermittent and throbbing. He denies headache, chest pain, shortness of breath, dizziness, urinary symptoms, nausea, vomiting, or diarrhea.  Objective:  Vital signs in last 24 hours:  Vitals:   01/24/19 0232 01/24/19 0639 01/24/19 0934 01/24/19 1333  BP: 102/70 115/88 108/72 124/89  Pulse: 97 88 89 91  Resp: 17 16  17   Temp: 98.4 F (36.9 C) 98.7 F (37.1 C) 98.2 F (36.8 C) 98.3 F (36.8 C)  TempSrc: Oral Oral Oral Oral  SpO2: 94% 100% 96% 94%    Intake/Output from previous day:   Intake/Output Summary (Last 24 hours) at 01/24/2019 1448 Last data filed at 01/24/2019 1357 Gross per 24 hour  Intake 377.58 ml  Output 100 ml  Net 277.58 ml    Physical Exam: General: Alert, awake, oriented x3, in no acute distress.  HEENT: [place]/AT PEERL, EOMI Neck: Trachea midline,  no masses, no thyromegal,y no JVD, no carotid bruit OROPHARYNX:  Moist, No exudate/ erythema/lesions.  Heart: Regular rate and rhythm, without murmurs, rubs, gallops, PMI non-displaced, no heaves or thrills on palpation.  Lungs: Clear to auscultation, no wheezing or rhonchi noted. No increased vocal fremitus resonant to percussion  Abdomen: Soft, nontender, nondistended, positive bowel sounds, no masses no hepatosplenomegaly noted..  Neuro: No focal neurological deficits noted cranial nerves II through XII grossly intact. DTRs 2+ bilaterally upper and lower extremities.  Strength 5 out of 5 in bilateral upper and lower extremities. Musculoskeletal: No warm swelling or erythema around joints, no spinal tenderness noted. Psychiatric: Patient alert and oriented x3, good insight and cognition, good recent to remote recall. Lymph node survey: No cervical axillary or inguinal lymphadenopathy noted.  Lab Results:  Basic Metabolic Panel:    Component Value Date/Time   NA 135 01/24/2019 0707   NA 142 10/01/2018 1430   K 4.5 01/24/2019 0707   CL 111 01/24/2019 0707   CO2 14 (L) 01/24/2019 0707   BUN 29 (H) 01/24/2019 0707   BUN 42 (H) 10/01/2018 1430   CREATININE 2.74 (H) 01/24/2019 0707   CREATININE 1.45 (H) 05/12/2014 1430   GLUCOSE 92 01/24/2019 0707   CALCIUM 8.8 (L) 01/24/2019 0707   CBC:    Component Value Date/Time   WBC 17.1 (H) 01/23/2019 1434   HGB 9.7 (L) 01/24/2019 0704   HGB 6.7 (LL) 12/24/2018 1352   HCT 28.2 (L) 01/24/2019 0704   HCT 19.4 (L) 12/24/2018 1352   PLT 204 01/23/2019 1434   PLT 243 12/24/2018 1352   MCV 110.9 (H) 01/23/2019 1434   MCV 112 (H) 12/24/2018 1352   NEUTROABS 8.6 (H) 01/21/2019 2353   NEUTROABS 5.0 12/24/2018 1352   LYMPHSABS 3.6 01/21/2019 2353   LYMPHSABS 0.8 12/24/2018 1352   MONOABS 1.9 (H) 01/21/2019 2353   EOSABS 0.2 01/21/2019 2353   EOSABS 0.0 12/24/2018 1352   BASOSABS 0.1 01/21/2019 2353   BASOSABS 0.0 12/24/2018 1352    Recent Results (from the past 240 hour(s))  Urine culture     Status: Abnormal  Collection Time: 01/22/19 10:14 AM   Specimen: Urine, Clean Catch  Result Value Ref Range Status   Specimen Description   Final    URINE, CLEAN CATCH Performed at [facility], [place] [address] [place], [place] [address]    Special Requests NONE  Final   Culture (A)  Final    <10,000 COLONIES/mL INSIGNIFICANT GROWTH Performed at [facility], [place] [address], [place], [place] [address]    Report Status 01/23/2019 FINAL  Final  SARS CORONAVIRUS 2 (TAT 6-24 HRS)  Nasopharyngeal Nasopharyngeal Swab     Status: None   Collection Time: 01/22/19  1:53 PM   Specimen: Nasopharyngeal Swab  Result Value Ref Range Status   SARS Coronavirus 2 NEGATIVE NEGATIVE Final    Comment: (NOTE) SARS-CoV-2 target nucleic acids are NOT DETECTED. The SARS-CoV-2 RNA is generally detectable in upper and lower respiratory specimens during the acute phase of infection. Negative results do not preclude SARS-CoV-2 infection, do not rule out co-infections with other pathogens, and should not be used as the sole basis for treatment or other patient management decisions. Negative results must be combined with clinical observations, patient history, and epidemiological information. The expected result is Negative. Fact Sheet for Patients: SugarRoll.be Fact Sheet for Healthcare Providers: https://www.woods-mathews.com/ This test is not yet approved or cleared by the [place] FDA and  has been authorized for detection and/or diagnosis of SARS-CoV-2 by FDA under an Emergency Use Authorization (EUA). This EUA will remain  in effect (meaning this test can be used) for the duration of the COVID-19 declaration under Section 56 4(b)(1) of the Act, 21 U.S.C. section 360bbb-3(b)(1), unless the authorization is terminated or revoked sooner. Performed at [facility], [place] [address] [place], [place] [address]     Studies/Results: DG Chest 1 View  Result Date: 01/24/2019 CLINICAL DATA:  Diffuse anterior chest pain. EXAM: CHEST  1 VIEW COMPARISON:  01/21/2019 FINDINGS: Borderline enlarged cardiac silhouette. Interval small amount of linear atelectasis or scarring at the left lung base. Otherwise, clear lungs. Stable sclerosis in the proximal right humerus, including the humeral head, compatible with avascular necrosis. IMPRESSION: Interval small amount of linear atelectasis or scarring at the left lung base. Electronically Signed   By:  Claudie Revering M.D.   On: 01/24/2019 10:16    Medications: Scheduled Meds: . cholecalciferol  2,000 Units Oral Daily  . enoxaparin (LOVENOX) injection  30 mg Subcutaneous Q24H  . folic acid  1 mg Oral Daily  . hydroxyurea  1,500 mg Oral Daily  . influenza vac split quadrivalent PF  0.5 mL Intramuscular Tomorrow-1000  . oxyCODONE  10 mg Oral Q4H while awake  . oxyCODONE  15 mg Oral Q12H  . senna-docusate  1 tablet Oral BID  . sodium bicarbonate  650 mg Oral BID  . tacrolimus  2 mg Oral BID   Continuous Infusions: PRN Meds:.nitroGLYCERIN, polyethylene glycol  Consultants:  None  Procedures:  2 D echocardiogram  Antibiotics:  None  Assessment/Plan: Principal Problem:   Sickle cell pain crisis (HCC) Active Problems:   TOBACCO ABUSE   Acute-on-chronic kidney injury (HCC)   Polysubstance abuse (HCC)   Leukocytosis   Transaminitis   Chronic pain syndrome   Renal insufficiency  Sickle cell disease with pain crisis: Continue OxyContin 15 mg every 12 hours and oxycodone 15 mg every 4 hours while awake.  We will continue to refrain from IV pain medications, especially IV Dilaudid via clinician assisted boluses.  Reevaluate pain  scale regularly and monitor vital signs closely.  Chronic pain syndrome: Continue home medications  Leukocytosis:  Stable.  Patient afebrile.  No signs of infection or inflammation.  Sickle cell anemia:  Hemoglobin is 9.7.  Patient is status post 1 unit of PRBCs on 01/23/2019.  Continue to follow closely.  History of polysubstance abuse: UDS on admission positive for cocaine.  Patient counseled at length.  Also, patient was referred to behavioral health specialist, declined rehabilitation.  Chest pain: Patient complained of chest pain overnight.  EKG showed normal sinus rhythm.  Chest x-ray unremarkable.  Troponin I elevated, patient does not have history of cardiovascular complications.  Patient is a cocaine user, which is an established risk  factor for acute cardiovascular complications.  According to literature, there is with ongoing chronic exposure to cocaine can lead to subclinical cardiac injury. Well review echocardiogram as results become available.  Patient's last echocardiogram was in 2019 LVEF was 60-65% at that time. Repeat troponin. Transfer to telemetry for cardiac monitoring.   Code Status: Full Code Family Communication: N/A Disposition Plan: Not yet ready for discharge   [place], MSN, FNP-C [facility] [place] [address] [place], [place] [address] [phone]  If 5PM-7AM, please contact night-coverage.  01/24/2019, 2:48 PM  LOS: 2 days

## 2019-01-24 NOTE — Progress Notes (Signed)
Echocardiogram 2D Echocardiogram has been performed.  [name] [name] 01/24/2019, 3:34 PM

## 2019-01-24 NOTE — Progress Notes (Signed)
Patient had an episode of chest pain. States pain has been on and off for the pass few days. [place] NP notified order given, vitals and lab drawn  see flowsheets and  summary.

## 2019-01-24 NOTE — Progress Notes (Addendum)
    BRIEF OVERNIGHT PROGRESS REPORT   SUBJECTIVE: Patient c/o chest pain without other associated symptoms or radiation. Report intermittent episodes of sharp  OBJECTIVE:vital signs stable with no evidence of hypoxia, fever, tachypnea or tachycardia.  ASSESSMENT:[name] [name], a 38 year old male with a medical history significant for sickle cell disease type SS, chronic kidney disease, immune complex glomerulonephritis, history of migraine headaches, and chronic left foot pain presents complaining of generalized pain that is consistent with his typical pain crisis  PLAN:  1. Chest pain - Concerns for acute chest syndrome. Patient with known hx of sickle cell disease admitted with sickle cell pain crisis. No Fever, cough, tachypnea, hypoxemia  or abdominal pain. -  Unclear if single event, or occuring with painful vaso-occlusive crisis - Check STAT EKG - Check Troponin, BMP and Mag - Chest xray - Telemetry monitoring - NTG + morphine PRN chest pain or NTG drip if ongoing chest pain  - Consider cardiology consult/STEMI if worsening or abnormal EKG   Rufina Falco, DNP, CCRN, FNP-C [facility] Nurse Practitioner Between 7pm to 7am - Pager [phone]  After 7am go to [email or web address] - password:[email or web address]  [facility] [employer]  [phone]

## 2019-01-24 NOTE — BH Specialist Note (Signed)
Integrated Behavioral Health Referral Note  Reason for Referral: Nihaal Barstow. is a 38 y.o. male  Pt was referred by NP, [place] Hollis for: substance use   Pt reports the following concerns: none  Assessment: Patient UDS positive for cocaine. CSW consulted by NP to assess and provide resources. Patient denied knowingly using cocaine to CSW, reports that marijuana he used must have been laced.   Plan: 1. Addressed today: Motivational interviewing intervention to address drug use. Reflected that this is the second instance patient has denied knowing how cocaine has gotten in his system after having used marijuana with friends and assessed patient's perception of problematic drug use. Patient states "It's not my problem." Patient denies need for change; is in pre-contemplation stage of change. Patient not interested in support, resources, or education on risks of using cocaine with CKD.  2. Referral: none  3. Follow up: as needed from clinic  Estanislado Emms, [facility] [phone]

## 2019-01-25 DIAGNOSIS — N1832 Chronic kidney disease, stage 3b: Secondary | ICD-10-CM

## 2019-01-25 DIAGNOSIS — F1410 Cocaine abuse, uncomplicated: Secondary | ICD-10-CM | POA: Diagnosis not present

## 2019-01-25 DIAGNOSIS — N179 Acute kidney failure, unspecified: Secondary | ICD-10-CM | POA: Diagnosis not present

## 2019-01-25 DIAGNOSIS — D5700 Hb-SS disease with crisis, unspecified: Secondary | ICD-10-CM | POA: Diagnosis not present

## 2019-01-25 DIAGNOSIS — R079 Chest pain, unspecified: Secondary | ICD-10-CM

## 2019-01-25 DIAGNOSIS — N184 Chronic kidney disease, stage 4 (severe): Secondary | ICD-10-CM | POA: Diagnosis not present

## 2019-01-25 LAB — COMPREHENSIVE METABOLIC PANEL
ALT: 60 U/L — ABNORMAL HIGH (ref 0–44)
AST: 75 U/L — ABNORMAL HIGH (ref 15–41)
Albumin: 3 g/dL — ABNORMAL LOW (ref 3.5–5.0)
Alkaline Phosphatase: 239 U/L — ABNORMAL HIGH (ref 38–126)
Anion gap: 9 (ref 5–15)
BUN: 30 mg/dL — ABNORMAL HIGH (ref 6–20)
CO2: 15 mmol/L — ABNORMAL LOW (ref 22–32)
Calcium: 8.7 mg/dL — ABNORMAL LOW (ref 8.9–10.3)
Chloride: 109 mmol/L (ref 98–111)
Creatinine, Ser: 2.2 mg/dL — ABNORMAL HIGH (ref 0.61–1.24)
GFR calc Af Amer: 43 mL/min — ABNORMAL LOW (ref >60)
GFR calc non Af Amer: 37 mL/min — ABNORMAL LOW (ref >60)
Glucose, Bld: 100 mg/dL — ABNORMAL HIGH (ref 70–99)
Potassium: 4.3 mmol/L (ref 3.5–5.1)
Sodium: 133 mmol/L — ABNORMAL LOW (ref 135–145)
Total Bilirubin: 3.1 mg/dL — ABNORMAL HIGH (ref 0.3–1.2)
Total Protein: 6.1 g/dL — ABNORMAL LOW (ref 6.5–8.1)

## 2019-01-25 LAB — CBC
HCT: 23.3 % — ABNORMAL LOW (ref 39.0–52.0)
Hemoglobin: 8.1 g/dL — ABNORMAL LOW (ref 13.0–17.0)
MCH: 34.5 pg — ABNORMAL HIGH (ref 26.0–34.0)
MCHC: 34.8 g/dL (ref 30.0–36.0)
MCV: 99.1 fL (ref 80.0–100.0)
Platelets: 219 10*3/uL (ref 150–400)
RBC: 2.35 MIL/uL — ABNORMAL LOW (ref 4.22–5.81)
RDW: 31.3 % — ABNORMAL HIGH (ref 11.5–15.5)
WBC: 11.4 10*3/uL — ABNORMAL HIGH (ref 4.0–10.5)
nRBC: 1.7 % — ABNORMAL HIGH (ref 0.0–0.2)

## 2019-01-25 MED ORDER — OXYCODONE HCL 10 MG PO TABS: 10.0000 mg | ORAL_TABLET | Freq: Four times a day (QID) | ORAL | 0 refills | Status: DC | PRN

## 2019-01-25 MED ORDER — ENOXAPARIN SODIUM 40 MG/0.4ML ~~LOC~~ SOLN: 40.0000 mg | SUBCUTANEOUS | Status: DC

## 2019-01-25 MED ORDER — OXYCODONE HCL ER 15 MG PO T12A: 15.0000 mg | EXTENDED_RELEASE_TABLET | Freq: Two times a day (BID) | ORAL | 0 refills | Status: DC

## 2019-01-25 NOTE — Discharge Instructions (Signed)
Chronic Kidney Disease, Adult °Chronic kidney disease (CKD) happens when the kidneys are damaged over a long period of time. The kidneys are two organs that help with: °· Getting rid of waste and extra fluid from the blood. °· Making hormones that maintain the amount of fluid in your tissues and blood vessels. °· Making sure that the body has the right amount of fluids and chemicals. °Most of the time, CKD does not go away, but it can usually be controlled. Steps must be taken to slow down the kidney damage or to stop it from getting worse. If this is not done, the kidneys may stop working. °Follow these instructions at home: °Medicines °· Take over-the-counter and prescription medicines only as told by your doctor. You may need to change the amount of medicines you take. °· Do not take any new medicines unless your doctor says it is okay. Many medicines can make your kidney damage worse. °· Do not take any vitamin and supplements unless your doctor says it is okay. Many vitamins and supplements can make your kidney damage worse. °General instructions °· Follow a diet as told by your doctor. You may need to stay away from: °? Alcohol. °? Salty foods. °? Foods that are high in: °§ Potassium. °§ Calcium. °§ Protein. °· Do not use any products that contain nicotine or tobacco, such as cigarettes and e-cigarettes. If you need help quitting, ask your doctor. °· Keep track of your blood pressure at home. Tell your doctor about any changes. °· If you have diabetes, keep track of your blood sugar as told by your doctor. °· Try to stay at a healthy weight. If you need help, ask your doctor. °· Exercise at least 30 minutes a day, 5 days a week. °· Stay up-to-date with your shots (immunizations) as told by your doctor. °· Keep all follow-up visits as told by your doctor. This is important. °Contact a doctor if: °· Your symptoms get worse. °· You have new symptoms. °Get help right away if: °· You have symptoms of end-stage  kidney disease. These may include: °? Headaches. °? Numbness in your hands or feet. °? Easy bruising. °? Having hiccups often. °? Chest pain. °? Shortness of breath. °? Stopping of menstrual periods in women. °· You have a fever. °· You have very little pee (urine). °· You have pain or bleeding when you pee. °Summary °· Chronic kidney disease (CKD) happens when the kidneys are damaged over a long period of time. °· Most of the time, this condition does not go away, but it can usually be controlled. Steps must be taken to slow down the kidney damage or to stop it from getting worse. °· Treatment may include a combination of medicines and lifestyle changes. °This information is not intended to replace advice given to you by your health care provider. Make sure you discuss any questions you have with your health care provider. °Document Released: 04/26/2009 Document Revised: 01/12/2017 Document Reviewed: 03/06/2016 °Elsevier Patient Education © 2020 [employer] ° °Sickle Cell Anemia, Adult ° °Sickle cell anemia is a condition where your red blood cells are shaped like sickles. Red blood cells carry oxygen through the body. Sickle-shaped cells do not live as long as normal red blood cells. They also clump together and block blood from flowing through the blood vessels. This prevents the body from getting enough oxygen. Sickle cell anemia causes organ damage and pain. It also increases the risk of infection. °Follow these instructions at home: °  Medicines °· Take over-the-counter and prescription medicines only as told by your doctor. °· If you were prescribed an antibiotic medicine, take it as told by your doctor. Do not stop taking the antibiotic even if you start to feel better. °· If you develop a fever, do not take medicines to lower the fever right away. Tell your doctor about the fever. °Managing pain, stiffness, and swelling °· Try these methods to help with pain: °? Use a heating pad. °? Take a warm  bath. °? Distract yourself, such as by watching TV. °Eating and drinking °· Drink enough fluid to keep your pee (urine) clear or pale yellow. Drink more in hot weather and during exercise. °· Limit or avoid alcohol. °· Eat a healthy diet. Eat plenty of fruits, vegetables, whole grains, and lean protein. °· Take vitamins and supplements as told by your doctor. °Traveling °· When traveling, keep these with you: °? Your medical information. °? The names of your doctors. °? Your medicines. °· If you need to take an airplane, talk to your doctor first. °Activity °· Rest often. °· Avoid exercises that make your heart beat much faster, such as jogging. °General instructions °· Do not use products that have nicotine or tobacco, such as cigarettes and e-cigarettes. If you need help quitting, ask your doctor. °· Consider wearing a medical alert bracelet. °· Avoid being in high places (high altitudes), such as mountains. °· Avoid very hot or cold temperatures. °· Avoid places where the temperature changes a lot. °· Keep all follow-up visits as told by your doctor. This is important. °Contact a doctor if: °· A joint hurts. °· Your feet or hands hurt or swell. °· You feel tired (fatigued). °Get help right away if: °· You have symptoms of infection. These include: °? Fever. °? Chills. °? Being very tired. °? Irritability. °? Poor eating. °? Throwing up (vomiting). °· You feel dizzy or faint. °· You have new stomach pain, especially on the left side. °· You have a an erection (priapism) that lasts more than 4 hours. °· You have numbness in your arms or legs. °· You have a hard time moving your arms or legs. °· You have trouble talking. °· You have pain that does not go away when you take medicine. °· You are short of breath. °· You are breathing fast. °· You have a long-term cough. °· You have pain in your chest. °· You have a bad headache. °· You have a stiff neck. °· Your stomach looks bloated even though you did not eat  much. °· Your skin is pale. °· You suddenly cannot see well. °Summary °· Sickle cell anemia is a condition where your red blood cells are shaped like sickles. °· Follow your doctor's advice on ways to manage pain, food to eat, activities to do, and steps to take for safe travel. °· Get medical help right away if you have any signs of infection, such as a fever. °This information is not intended to replace advice given to you by your health care provider. Make sure you discuss any questions you have with your health care provider. °Document Released: 11/20/2012 Document Revised: 05/24/2018 Document Reviewed: 03/07/2016 °Elsevier Patient Education © 2020 Elsevier Inc. ° °

## 2019-01-25 NOTE — Progress Notes (Signed)
Please note patient refused routine vital signs during shift.

## 2019-01-25 NOTE — Progress Notes (Signed)
Nsg Discharge Note  Admit Date:  01/22/2019 Discharge date: 01/25/2019   Christia Reading A Marcial Pacas. to be D/C'd Home per MD order.  AVS completed.  Copy for chart, and copy for patient signed, and dated. Patient/caregiver able to verbalize understanding.  Discharge Medication: Allergies as of 01/25/2019      Reactions   Nsaids Other (See Comments)   Kidney disease   Morphine And Related Other (See Comments)   "Real bad headaches"       Medication List    TAKE these medications   cholecalciferol 25 MCG (1000 UT) tablet Commonly known as: VITAMIN D3 Take 2,000 Units by mouth daily.   folic acid 1 MG tablet Commonly known as: FOLVITE Take 1 tablet (1 mg total) daily by mouth.   hydroxyurea 500 MG capsule Commonly known as: HYDREA TAKE 3 CAPSULES BY MOUTH EVERY DAY What changed:   how much to take  how to take this  when to take this  additional instructions   nortriptyline 25 MG capsule Commonly known as: PAMELOR Take 1 capsule (25 mg total) by mouth at bedtime as needed for sleep.   Oxycodone HCl 10 MG Tabs Take 1 tablet (10 mg total) by mouth every 6 (six) hours as needed.   oxyCODONE 15 mg 12 hr tablet Commonly known as: OxyCONTIN Take 1 tablet (15 mg total) by mouth every 12 (twelve) hours.   sodium bicarbonate 650 MG tablet Take 650 mg by mouth 2 (two) times daily.   tacrolimus 1 MG capsule Commonly known as: PROGRAF Take 2 mg by mouth 2 (two) times daily.       Discharge Assessment: Vitals:   01/24/19 2329 01/25/19 0335  BP: 116/79 115/78  Pulse: 92 85  Resp: 18 16  Temp: 98.4 F (36.9 C) 98.2 F (36.8 C)  SpO2: 97% 96%   Skin clean, dry and intact without evidence of skin break down, no evidence of skin tears noted. IV catheter discontinued intact. Site without signs and symptoms of complications - no redness or edema noted at insertion site, patient denies c/o pain - only slight tenderness at site.  Dressing with slight pressure applied.  D/c  Instructions-Education: Discharge instructions given to patient/family with verbalized understanding. D/c education completed with patient/family including follow up instructions, medication list, d/c activities limitations if indicated, with other d/c instructions as indicated by MD - patient able to verbalize understanding, all questions fully answered. Patient instructed to return to ED, call 911, or call MD for any changes in condition.  Patient escorted via [place], and D/C home via private auto.  Eustace Pen, RN 01/25/2019 2:45 PM

## 2019-01-25 NOTE — Discharge Summary (Signed)
Physician Discharge Summary  [name] [name]. ID:[id number] DOB: [birth year] DOA: 01/22/2019  PCP: [name], MD  Admit date: 01/22/2019  Discharge date: 01/25/2019  Discharge Diagnoses:  Principal Problem:   Sickle cell pain crisis ([place]) Active Problems:   TOBACCO ABUSE   Acute-on-chronic kidney injury ([place])   Polysubstance abuse ([place])   Leukocytosis   Transaminitis   Chronic pain syndrome   Renal insufficiency   Discharge Condition: Stable  Disposition:  Pt is discharged home in good condition and is to follow up with Cammie Sickle, FNP this week. [name] A Sprinkle Jr. is instructed to increase activity slowly and balance with rest for the next few days, and use prescribed medication to complete treatment of pain  Diet: Regular Wt Readings from Last 3 Encounters:  01/21/19 59 kg  01/21/19 58.1 kg  01/08/19 58.1 kg    History of present illness:  [name] [name] is a 38 year old male with a medical history significant for sickle cell disease type SS, chronic kidney disease stage IIIb, immune complex glomerulonephritis, history of migraine headaches, history of polysubstance abuse, and chronic left foot pain presented to sickle cell day infusion center complaining of generalized pain that is consistent with his pain crisis.  Patient was treated and evaluated in the ER this a.m., he was appropriate to transition to sickle cell day infusion center for pain management and extended observation.  Patient says that pain is primarily to low back and left foot.  Left foot pain is an ongoing issue.  Patient was evaluated by orthopedic specialist on 01/21/2019 and was advised to report to the ER that at that time for further work-up and pain control.  Patient attributes pain crisis to running out of opiate medication several days ago.  He admits to taking more medication than prescribed for greater pain control.  Patient also endorses fatigue.  He denies fever, chills,  dizziness, paresthesias, urinary symptoms, nausea, vomiting, or diarrhea.  No shortness of breath or chest pain.  No sick contacts, recent travel, or exposure to COVID-19.  Sickle cell day infusion center course: Patient's WBCs currently 14.6, he remains afebrile.  Hemoglobin 7.5, consistent with patient's baseline.  Serum creatinine is 2.98 on today, patient's baseline creatinine is typically 2.1-2.3, he is followed by nephrology.  Also, AST 86 and ALT 78.  Total bilirubin is elevated at 3.7.  Vital signs are reassuring.  Pain persists despite IV Dilaudid via PCA and IV fluids.  Patient admitted to [facility] for management of sickle cell pain crisis.  Hospital Course:  Patient was admitted for sickle cell pain crisis and managed appropriately with IVF, IV Dilaudid via PCA a, as well as other adjunct therapies per sickle cell pain management protocols.  IV Dilaudid PCA was discontinued after reviewing patient's urine drug screen.  Urine drug screen was positive for both cocaine and marijuana.  Patient was transitioned to his home medications including oxycodone 15 mg every 6 hours and OxyContin 15 mg every 12 hours.  Pain intensity decreased to 3-4/10.  Will complete treatment with oxycodone 10 mg every 4 hours #20 and OxyContin 15 mg every 12 hours # 20.  Reviewed PDMP prior to prescribing opiate medications, there are no inconsistencies noted. Patient and I discussed drug rehabilitation at length for cocaine use.  Patient is adamant that he does not use cocaine.  He says that he does not know how it got into his system.  Patient says there is a possibility that his marijuana could have been "laced"  with cocaine.  Whether or not that is the case, this provider will not be able to continue to prescribe for him with any illicit drug use.  Patient will follow-up in clinic for medication management.  Patient will warrant 2 consecutive negative drug screens in order to continue to prescribe per current patient  agreement.  At follow-up appointment, will review and sign pain management agreement. Also, patient was referred to LCSW/behavioral specialist, Estanislado Emms to discuss illicit drug use and possible drug rehabilitation. Patient was not receptive to her assistance and declined services.  Chronic kidney disease stage IIIb: Patient has a long history of chronic kidney disease related to sickle cell nephropathy.  He is under the care of nephrologist at [facility].  Patient sees nephrology regularly.  Upon admission, creatinine was elevated at 2.8, which was above patient's baseline.  During admission, nephrotoxic medications were avoided and gentle hydration was initiated.  Patient's creatinine decreased to 2.2 at discharge, which is consistent with his baseline.  He is advised to continue all prescribed medications and follow-up with nephrology as scheduled.  Patient has a history of anemia of chronic disease.  Hemoglobin decreased below baseline.  Patient received 1 unit of packed red blood cells.  Hemoglobin is 8.1 at discharge, which is with his baseline.  Will repeat hemoglobin in 1 week with PCP.  Patient complained of central chest pain.  On-call provider was notified chest x-ray, EKG, and troponin were obtained.  Troponin I x2 were both elevated.  Reviewed echocardiogram, LV EF 55-60%, consistent with previous echocardiogram.  EKG was normal sinus rhythm.  Patient transferred to telemetry to monitor for 24 hours.  No events on cardiac monitors, patient was mostly sinus rhythm.  Elevated troponin levels were attributed to recent cocaine use.  Cocaine use is an established risk factor for acute cardiovascular complications.  According to literature review, ongoing chronic exposure to cocaine can lead to subclinical cardiac injury.  These findings were discussed with patient at length.  Again, patient is adamant about cocaine use.  He is alert, oriented, and ambulating without  assistance.  Oxygen saturation 100% on RA.  Pain intensity consistent with his baseline.  Pain medications have been sent to pharmacy to complete treatment.  Patient was discharged home today in a hemodynamically stable condition.   Discharge Exam: Vitals:   01/24/19 2329 01/25/19 0335  BP: 116/79 115/78  Pulse: 92 85  Resp: 18 16  Temp: 98.4 F (36.9 C) 98.2 F (36.8 C)  SpO2: 97% 96%   Vitals:   01/24/19 1729 01/24/19 2005 01/24/19 2329 01/25/19 0335  BP: 120/85 119/80 116/79 115/78  Pulse: 84 (!) 104 92 85  Resp: 17 16 18 16   Temp: 98.4 F (36.9 C) 98.4 F (36.9 C) 98.4 F (36.9 C) 98.2 F (36.8 C)  TempSrc: Oral Oral Oral Oral  SpO2: 97% 98% 97% 96%    General appearance : Awake, alert, not in any distress. Speech Clear. Not toxic looking HEENT: Atraumatic and Normocephalic, pupils equally reactive to light and accomodation Neck: Supple, no JVD. No cervical lymphadenopathy.  Chest: Good air entry bilaterally, no added sounds  CVS: S1 S2 regular, no murmurs.  Abdomen: Bowel sounds present, Non tender and not distended with no gaurding, rigidity or rebound. Extremities: B/L Lower Ext shows no edema, both legs are warm to touch Neurology: Awake alert, and oriented X 3, CN II-XII intact, Non focal Skin: No Rash  Discharge Instructions  Discharge Instructions    Diet -  low sodium heart healthy   Complete by: As directed    Discharge patient   Complete by: As directed    Discharge disposition: 01-Home or Self Care   Discharge patient date: 01/25/2019   Increase activity slowly   Complete by: As directed      Allergies as of 01/25/2019      Reactions   Nsaids Other (See Comments)   Kidney disease   Morphine And Related Other (See Comments)   "Real bad headaches"       Medication List    TAKE these medications   cholecalciferol 25 MCG (1000 UT) tablet Commonly known as: VITAMIN D3 Take 2,000 Units by mouth daily.   folic acid 1 MG tablet Commonly known  as: FOLVITE Take 1 tablet (1 mg total) daily by mouth.   hydroxyurea 500 MG capsule Commonly known as: HYDREA TAKE 3 CAPSULES BY MOUTH EVERY DAY What changed:   how much to take  how to take this  when to take this  additional instructions   nortriptyline 25 MG capsule Commonly known as: PAMELOR Take 1 capsule (25 mg total) by mouth at bedtime as needed for sleep.   Oxycodone HCl 10 MG Tabs Take 1 tablet (10 mg total) by mouth every 6 (six) hours as needed.   oxyCODONE 15 mg 12 hr tablet Commonly known as: OxyCONTIN Take 1 tablet (15 mg total) by mouth every 12 (twelve) hours.   sodium bicarbonate 650 MG tablet Take 650 mg by mouth 2 (two) times daily.   tacrolimus 1 MG capsule Commonly known as: PROGRAF Take 2 mg by mouth 2 (two) times daily.       The results of significant diagnostics from this hospitalization (including imaging, microbiology, ancillary and laboratory) are listed below for reference.    Significant Diagnostic Studies: DG Chest 1 View  Result Date: 01/24/2019 CLINICAL DATA:  Diffuse anterior chest pain. EXAM: CHEST  1 VIEW COMPARISON:  01/21/2019 FINDINGS: Borderline enlarged cardiac silhouette. Interval small amount of linear atelectasis or scarring at the left lung base. Otherwise, clear lungs. Stable sclerosis in the proximal right humerus, including the humeral head, compatible with avascular necrosis. IMPRESSION: Interval small amount of linear atelectasis or scarring at the left lung base. Electronically Signed   By: Claudie Revering M.D.   On: 01/24/2019 10:16   CT Head Wo Contrast  Result Date: 01/06/2019 CLINICAL DATA:  Headache EXAM: CT HEAD WITHOUT CONTRAST TECHNIQUE: Contiguous axial images were obtained from the base of the skull through the vertex without intravenous contrast. COMPARISON:  CT brain 03/28/2010, brain MRI 02/22/2007 FINDINGS: Brain: No acute territorial infarction hemorrhage or new intracranial mass. Left greater than right  CSF collections anterior to the temporal lobes with associated volume loss is chronic. The ventricles are stable in size. Vascular: No hyperdense vessels.  No unexpected calcification Skull: Normal. Negative for fracture or focal lesion. Sinuses/Orbits: No acute finding. Other: None IMPRESSION: 1. No CT evidence for acute intracranial abnormality. 2. Mild cortical volume loss somewhat advanced for age with stable left greater than right extra-axial CSF densities along the temporal convexities. Electronically Signed   By: Donavan Foil M.D.   On: 01/06/2019 20:30   DG Chest Portable 1 View  Result Date: 01/21/2019 CLINICAL DATA:  Sickle cell crisis and fevers EXAM: PORTABLE CHEST 1 VIEW COMPARISON:  11/30/2018 FINDINGS: Cardiac shadows within normal limits. The lungs are well aerated bilaterally. No focal infiltrate or sizable effusion is seen. No acute bony abnormality is noted. Chronic  changes the right humeral head seen consistent with avascular necrosis. IMPRESSION: No acute abnormality noted. Electronically Signed   By: Inez Catalina M.D.   On: 01/21/2019 23:16   ECHOCARDIOGRAM COMPLETE  Result Date: 01/24/2019   ECHOCARDIOGRAM REPORT   Patient Name:   Summer Haenel. Date of Exam: 01/24/2019 Medical Rec #:  [id number]              Height:       63.0 in Accession #:    [id number]             Weight:       130.0 lb Date of Birth:  Aug 17, 1980             BSA:          1.61 m Patient Age:    15 years               BP:           124/84 mmHg Patient Gender: M                      HR:           91 bpm. Exam Location:  Inpatient Procedure: 2D Echo, Cardiac Doppler and Color Doppler Indications:    Chest pain, Sickle cell crisis  History:        Patient has prior history of Echocardiogram examinations, most                 recent 02/28/2017. Signs/Symptoms:Chest Pain; Risk                 Factors:Current Smoker. Sickle cell crisis, CKD, polysubstance                 abuse.  Sonographer:    Dustin Flock Referring Phys: [place] Comments: Image acquisition challenging due to uncooperative patient. IMPRESSIONS  1. Left ventricular ejection fraction, by visual estimation, is 55 to 60%. The left ventricle has normal function. There is no left ventricular hypertrophy.  2. The left ventricle demonstrates regional wall motion abnormalities.  3. Global right ventricle has normal systolic function.The right ventricular size is normal. No increase in right ventricular wall thickness.  4. Left atrial size was normal.  5. Right atrial size was normal.  6. The mitral valve is normal in structure. No evidence of mitral valve regurgitation.  7. The tricuspid valve is normal in structure. Tricuspid valve regurgitation is trivial.  8. The aortic valve is normal in structure. Aortic valve regurgitation is not visualized.  9. The pulmonic valve was normal in structure. Pulmonic valve regurgitation is not visualized. 10. The atrial septum is grossly normal. FINDINGS  Left Ventricle: Left ventricular ejection fraction, by visual estimation, is 55 to 60%. The left ventricle has normal function. The left ventricle demonstrates regional wall motion abnormalities. There is no left ventricular hypertrophy. Right Ventricle: The right ventricular size is normal. No increase in right ventricular wall thickness. Global RV systolic function is has normal systolic function. Left Atrium: Left atrial size was normal in size. Right Atrium: Right atrial size was normal in size Pericardium: There is no evidence of pericardial effusion. Mitral Valve: The mitral valve is normal in structure. No evidence of mitral valve regurgitation. Tricuspid Valve: The tricuspid valve is normal in structure. Tricuspid valve regurgitation is trivial. Aortic Valve: The aortic valve is normal in structure. Aortic valve regurgitation is not visualized. Pulmonic Valve:  The pulmonic valve was normal in structure. Pulmonic valve  regurgitation is not visualized. Pulmonic regurgitation is not visualized. Aorta: The aortic root and ascending aorta are structurally normal, with no evidence of dilitation. IAS/Shunts: The atrial septum is grossly normal.  LEFT VENTRICLE PLAX 2D LVIDd:         4.10 cm  Diastology LVIDs:         2.80 cm  LV e' lateral:   8.59 cm/s LV PW:         1.00 cm  LV E/e' lateral: 11.4 LV IVS:        0.80 cm  LV e' medial:    7.83 cm/s LVOT diam:     2.10 cm  LV E/e' medial:  12.5 LV SV:         45 ml LV SV Index:   27.49 LVOT Area:     3.46 cm  RIGHT VENTRICLE RV Basal diam:  3.20 cm RV S prime:     10.70 cm/s TAPSE (M-mode): 2.9 cm LEFT ATRIUM           Index       RIGHT ATRIUM           Index LA diam:      2.70 cm 1.68 cm/m  RA Area:     12.80 cm LA Vol (A2C): 33.8 ml 20.99 ml/m RA Volume:   30.50 ml  18.94 ml/m LA Vol (A4C): 33.7 ml 20.93 ml/m  AORTIC VALVE LVOT Vmax:   113.00 cm/s LVOT Vmean:  69.900 cm/s LVOT VTI:    0.169 m  AORTA Ao Root diam: 2.80 cm MITRAL VALVE MV Area (PHT): 4.57 cm              SHUNTS MV PHT:        48.14 msec            Systemic VTI:  0.17 m MV Decel Time: 166 msec              Systemic Diam: 2.10 cm MV E velocity: 97.50 cm/s  103 cm/s MV A velocity: 108.00 cm/s 70.3 cm/s MV E/A ratio:  0.90        1.5  Mertie Moores MD Electronically signed by Mertie Moores MD Signature Date/Time: 01/24/2019/5:06:51 PM    Final     Microbiology: Recent Results (from the past 240 hour(s))  Urine culture     Status: Abnormal   Collection Time: 01/22/19 10:14 AM   Specimen: Urine, Clean Catch  Result Value Ref Range Status   Specimen Description   Final    URINE, CLEAN CATCH Performed at [facility], [place] [address], [place], [place] [address]    Special Requests NONE  Final   Culture (A)  Final    <10,000 COLONIES/mL INSIGNIFICANT GROWTH Performed at [facility], [place] [address], [place],  [address]    Report Status 01/23/2019 FINAL  Final  SARS  CORONAVIRUS 2 (TAT 6-24 HRS) Nasopharyngeal Nasopharyngeal Swab     Status: None   Collection Time: 01/22/19  1:53 PM   Specimen: Nasopharyngeal Swab  Result Value Ref Range Status   SARS Coronavirus 2 NEGATIVE NEGATIVE Final    Comment: (NOTE) SARS-CoV-2 target nucleic acids are NOT DETECTED. The SARS-CoV-2 RNA is generally detectable in upper and lower respiratory specimens during the acute phase of infection. Negative results do not preclude SARS-CoV-2 infection, do not rule out co-infections with other pathogens, and should not be used as  the sole basis for treatment or other patient management decisions. Negative results must be combined with clinical observations, patient history, and epidemiological information. The expected result is Negative. Fact Sheet for Patients: SugarRoll.be Fact Sheet for Healthcare Providers: https://www.woods-mathews.com/ This test is not yet approved or cleared by the [place] FDA and  has been authorized for detection and/or diagnosis of SARS-CoV-2 by FDA under an Emergency Use Authorization (EUA). This EUA will remain  in effect (meaning this test can be used) for the duration of the COVID-19 declaration under Section 56 4(b)(1) of the Act, 21 U.S.C. section 360bbb-3(b)(1), unless the authorization is terminated or revoked sooner. Performed at [facility], [place] [address] [place], [place] [address]      Labs: Basic Metabolic Panel: Recent Labs  Lab 01/21/19 2353 01/23/19 1434 01/24/19 0707 01/25/19 0732  NA 136 134* 135 133*  K 4.1 4.4 4.5 4.3  CL 113* 111 111 109  CO2 15* 14* 14* 15*  GLUCOSE 108* 92 92 100*  BUN 24* 28* 29* 30*  CREATININE 2.98* 2.72* 2.74* 2.20*  CALCIUM 9.1 8.6* 8.8* 8.7*   Liver Function Tests: Recent Labs  Lab 01/21/19 2353 01/25/19 0732  AST 86* 75*  ALT 78* 60*  ALKPHOS 270* 239*  BILITOT 3.7* 3.1*  PROT 7.1 6.1*  ALBUMIN 3.4* 3.0*   No results  for input(s): LIPASE, AMYLASE in the last 168 hours. No results for input(s): AMMONIA in the last 168 hours. CBC: Recent Labs  Lab 01/21/19 2353 01/23/19 1434 01/24/19 0704 01/25/19 0732  WBC 14.6* 17.1*  --  11.4*  NEUTROABS 8.6*  --   --   --   HGB 7.5* 6.6* 9.7* 8.1*  HCT 21.7* 19.4* 28.2* 23.3*  MCV 110.7* 110.9*  --  99.1  PLT 185 204  --  219   Cardiac Enzymes: No results for input(s): CKTOTAL, CKMB, CKMBINDEX, TROPONINI in the last 168 hours. BNP: Invalid input(s): POCBNP CBG: No results for input(s): GLUCAP in the last 168 hours.  Time coordinating discharge: 50 minutes  Signed: [name]  APRN, MSN, FNP-C Patient [facility] [address] [place], [place] [address] [phone]  [facility] 01/25/2019, 2:07 PM

## 2019-01-28 ENCOUNTER — Other Ambulatory Visit: Payer: Self-pay

## 2019-01-28 ENCOUNTER — Ambulatory Visit (INDEPENDENT_AMBULATORY_CARE_PROVIDER_SITE_OTHER): Payer: Medicare Other | Admitting: Family Medicine

## 2019-01-28 ENCOUNTER — Encounter: Payer: Self-pay | Admitting: Family Medicine

## 2019-01-28 VITALS — BP 107/74 | HR 78 | Temp 98.4°F | Resp 16 | Ht 63.0 in | Wt 128.0 lb

## 2019-01-28 DIAGNOSIS — G894 Chronic pain syndrome: Secondary | ICD-10-CM

## 2019-01-28 DIAGNOSIS — Z23 Encounter for immunization: Secondary | ICD-10-CM | POA: Diagnosis not present

## 2019-01-28 DIAGNOSIS — F1910 Other psychoactive substance abuse, uncomplicated: Secondary | ICD-10-CM

## 2019-01-28 DIAGNOSIS — D571 Sickle-cell disease without crisis: Secondary | ICD-10-CM

## 2019-01-28 LAB — POCT URINALYSIS DIPSTICK
Bilirubin, UA: NEGATIVE
Glucose, UA: POSITIVE — AB
Ketones, UA: NEGATIVE
Leukocytes, UA: NEGATIVE
Nitrite, UA: NEGATIVE
Protein, UA: POSITIVE — AB
Spec Grav, UA: 1.02 (ref 1.010–1.025)
Urobilinogen, UA: 0.2 E.U./dL
pH, UA: 6.5 (ref 5.0–8.0)

## 2019-01-28 NOTE — Progress Notes (Signed)
[name]   Established Patient Office Visit  Subjective:  Patient ID: [name] [name]., male    DOB: 1980/09/08  Age: 38 y.o. MRN: [id number]  CC:  Chief Complaint  Patient presents with  . Sickle Cell Anemia    [name]  HPI [name] [name]., a 38 year old male with a medical history significant for sickle cell disease type SS, chronic pain syndrome, opiate dependence and tolerance, chronic kidney disease stage IIIb, immune complex glomerulonephritis, history of migraine headaches, and history of polysubstance abuse presents for post hospital follow-up and medication management. Patient was admitted on 01/22/2019 for sickle cell pain crisis.  Patient attributed pain crisis to changes in weather.  Pain was initially managed with IV Dilaudid via PCA.  However pain medication was transitioned to oxycodone and OxyContin due to positive urine drug screen. Patient's urine drug screen was found to be positive for cocaine.  Patient does not understand how urine culture became positive, he states that he has not actively been using cocaine.  However, he admits to smoking marijuana with friends prior to admission.  Currently pain intensity is 6/10 characterized as intermittent and aching.  Pain is primarily to lower extremities.  He currently denies headache, chest pain, shortness of breath, urinary symptoms, nausea, vomiting, or diarrhea.    Past Medical History:  Diagnosis Date  . Abscess of right iliac fossa 09/24/2018   required Perc Drain 09/24/2018  . Arachnoid Cyst of brain bilaterally    "2 really small ones in the back of my head; inside; saw them w/MRI" (09/25/2012)  . Bacterial pneumonia ~ 2012   "caught it here in the hospital" (09/25/2012)  . Chronic kidney disease    "from my sickle cell" (09/25/2012)  . CKD (chronic kidney disease), stage II   . Colitis 04/19/2017   CT  scan abd/ pelvis  . GERD (gastroesophageal reflux disease)    "after I eat alot of spicey foods" (09/25/2012)  . Gynecomastia, male 07/10/2012  . History of blood transfusion    "always related to sickle cell crisis" (09/25/2012)  . 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   Noted in noted from Hematology notes at [facility]  . Migraines    "take RX qd to prevent them" (09/25/2012)  . Opioid dependence with withdrawal ([place]) 08/30/2012  . Renal insufficiency   . Sickle cell anemia (HCC)   . Sickle cell crisis ([place]) 09/25/2012  . Sickle cell nephropathy ([place]) 07/10/2012  . Sinus tachycardia   . Tachycardia with heart rate 121-140 beats per minute with ambulation 08/04/2016    Past Surgical History:  Procedure Laterality Date  . ABCESS DRAINAGE    . CHOLECYSTECTOMY  ~ 2012  . COLONOSCOPY N/A 04/23/2017   Procedure: COLONOSCOPY;  Surgeon: Irene Shipper, MD;  Location: [facility];  Service: Endoscopy;  Laterality: N/A;  . IR FLUORO GUIDE CV LINE RIGHT  12/17/2016  . IR RADIOLOGIST EVAL & MGMT  10/02/2018  . IR RADIOLOGIST EVAL & MGMT  10/15/2018  . IR REMOVAL TUN CV CATH W/O FL  12/21/2016  . IR US GUIDE VASC ACCESS RIGHT  12/17/2016  . spleenectomy      Family History  Problem Relation Age of Onset  . Breast cancer Mother     Social History   Socioeconomic History  . Marital status: Single    Spouse name: Not on file  . Number of children: Not on file  .  Years of education: Not on file  . Highest education level: Not on file  Occupational History  . Not on file  Tobacco Use  . Smoking status: Current Some Day Smoker    Packs/day: 0.10    Types: Cigarettes  . Smokeless tobacco: Never Used  . Tobacco comment: 09/25/2012 "I don't buy cigarettes; bum one from friends q now and then"  Substance and Sexual Activity  . Alcohol use: Yes    Alcohol/week: 0.0 standard drinks    Comment: Once a month  . Drug use: Not Currently    Types: Marijuana    Comment: Every 2-3 weeks     . Sexual activity: Yes  Other Topics Concern  . Not on file  Social History Narrative    Lives alone in [place].   Back at school, studying Public relations account executive. Unemployed.    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    Social Determinants of Health   Financial Resource Strain:   . Difficulty of Paying Living Expenses: Not on file  Food Insecurity:   . Worried About Charity fundraiser in the Last Year: Not on file  . Ran Out of Food in the Last Year: Not on file  Transportation Needs:   . Lack of Transportation (Medical): Not on file  . Lack of Transportation (Non-Medical): Not on file  Physical Activity:   . Days of Exercise per Week: Not on file  . Minutes of Exercise per Session: Not on file  Stress:   . Feeling of Stress : Not on file  Social Connections:   . Frequency of Communication with Friends and Family: Not on file  . Frequency of Social Gatherings with Friends and Family: Not on file  . Attends Religious Services: Not on file  . Active Member of Clubs or Organizations: Not on file  . Attends Archivist Meetings: Not on file  . Marital Status: Not on file  Intimate Partner Violence:   . Fear of Current or Ex-Partner: Not on file  . Emotionally Abused: Not on file  . Physically Abused: Not on file  . Sexually Abused: Not on file    Outpatient Medications Prior to Visit  Medication Sig Dispense Refill  . hydroxyurea (HYDREA) 500 MG capsule TAKE 3 CAPSULES BY MOUTH EVERY DAY (Patient taking differently: Take 1,500 mg by mouth daily. ) 270 capsule 1  . nortriptyline (PAMELOR) 25 MG capsule Take 1 capsule (25 mg total) by mouth at bedtime as needed for sleep. 90 capsule 3  . oxyCODONE (OXYCONTIN) 15 mg 12 hr tablet Take 1 tablet (15 mg total) by mouth every 12 (twelve) hours. 20 tablet 0  . Oxycodone HCl 10 MG  TABS Take 1 tablet (10 mg total) by mouth every 6 (six) hours as needed. 20 tablet 0  . sodium bicarbonate 650 MG tablet Take 650 mg by mouth 2 (two) times daily.    . tacrolimus (PROGRAF) 1 MG capsule Take 2 mg by mouth 2 (two) times daily.    . cholecalciferol (VITAMIN D3) 25 MCG (1000 UT) tablet Take 2,000 Units by mouth daily.    . folic acid (FOLVITE) 1 MG tablet Take 1 tablet (1 mg total) daily by mouth. (Patient not taking: Reported on 01/28/2019) 30 tablet 2   No facility-administered medications prior to visit.    Allergies  Allergen Reactions  . Nsaids Other (See Comments)    Kidney disease  . Morphine And Related Other (See Comments)    "Real bad headaches"     ROS Review of Systems  Constitutional: Negative.   HENT: Negative.   Eyes: Negative for photophobia.  Respiratory: Negative.  Negative for shortness of breath.   Cardiovascular: Negative.  Negative for chest pain.  Genitourinary: Negative for dysuria.  Musculoskeletal: Positive for arthralgias and back pain.  Skin: Negative.   Neurological: Negative.   Hematological: Negative.   Psychiatric/Behavioral: Negative.       Objective:    Physical Exam  Constitutional: He is oriented to person, place, and time. He appears well-developed and well-nourished.  HENT:  Head: Normocephalic.  Cardiovascular: Normal rate and regular rhythm.  Pulmonary/Chest: Effort normal.  Abdominal: Soft. Bowel sounds are normal.  Musculoskeletal:        General: Normal range of motion.     Cervical back: Normal range of motion.  Neurological: He is alert and oriented to person, place, and time.  Skin: Skin is warm.  Psychiatric: He has a normal mood and affect. His behavior is normal. Judgment and thought content normal.    BP 107/74 (BP Location: Right Arm, Patient Position: Sitting, Cuff Size: Normal)   Pulse 78   Temp 98.4 F (36.9 C) (Oral)   Resp 16   Ht 5\' 3"  (1.6 m)   Wt 128 lb (58.1 kg)   SpO2 100%   BMI 22.67  kg/m  Wt Readings from Last 3 Encounters:  01/28/19 128 lb (58.1 kg)  01/21/19 130 lb (59 kg)  01/21/19 128 lb (58.1 kg)     There are no preventive [name] reminders to display for this patient.  There are no preventive [name] reminders to display for this patient.  Lab Results  Component Value Date   TSH 2.860 05/11/2017   Lab Results  Component Value Date   WBC 11.4 (H) 01/25/2019   HGB 8.1 (L) 01/25/2019   HCT 23.3 (L) 01/25/2019   MCV 99.1 01/25/2019   PLT 219 01/25/2019   Lab Results  Component Value Date   NA 133 (L) 01/25/2019   K 4.3 01/25/2019   CO2 15 (L) 01/25/2019   GLUCOSE 100 (H) 01/25/2019   BUN 30 (H) 01/25/2019   CREATININE 2.20 (H) 01/25/2019   BILITOT 3.1 (H) 01/25/2019   ALKPHOS 239 (H) 01/25/2019   AST 75 (H) 01/25/2019   ALT 60 (H) 01/25/2019   PROT 6.1 (L) 01/25/2019   ALBUMIN 3.0 (L) 01/25/2019   CALCIUM 8.7 (L) 01/25/2019   ANIONGAP 9 01/25/2019   Lab Results  Component Value Date   CHOL 145 03/07/2018   Lab Results  Component Value Date   HDL 65 03/07/2018   Lab Results  Component Value Date   LDLCALC 30 03/07/2018   Lab Results  Component Value Date   TRIG 251 (H) 03/07/2018   Lab Results  Component Value Date   CHOLHDL 2.2 03/07/2018   Lab Results  Component Value Date   HGBA1C <4.2 (L) 03/07/2018      Assessment & Plan:   Problem List Items Addressed This Visit      Other   Chronic pain syndrome   Relevant Orders   [id number] 11+Oxyco+Alc+Crt-Bund    Other Visit Diagnoses    Hb-SS disease without crisis ([place])    -  Primary   Relevant Orders   Urinalysis Dipstick (Completed)   CBC  Hb-SS disease without crisis (HCC)  Continue Hydrea 1000 mg daily. Will check CBC for absolute neutrophil count and platelets. Will also check reticulocyte count. Will consider increasing dosage. We discussed the need for good hydration, monitoring of hydration status, avoidance of heat, cold, stress, and infection triggers. We  discussed the risks and benefits of Hydrea, including bone marrow suppression, the possibility of GI upset, skin ulcers, hair thinning, and teratogenicity. Reminded Ms. [place] Crane of the needed to use contraception while on hydroxyurea due to teratogenicity. The patient was reminded of the need to seek medical attention of any symptoms of bleeding, anemia, or infection. Will start folic acid 1 mg daily to prevent aplastic bone marrow crises.    Acute and chronic painful episodes - We agreed pain medication regimen.  We discussed that pt is to receive his Schedule II prescriptions only from [place]. He warrants drug screens that are negative for illicit substances. Pt is also aware that the prescription history is available to [place] online through the [facility] CSRS. Controlled substance agreement signed today. We reminded TJ that all patients receiving Schedule II narcotics must be seen for follow within one month of prescription being requested. We reviewed the terms of our pain agreement, including the need to keep medicines in a safe locked location away from children or pets, and the need to report excess sedation or constipation, measures to avoid constipation, and policies related to early refills and stolen prescriptions. According to the [place] [facility] program, we have reviewed details related to analgesia, adverse effects, aberrant behaviors. Reviewed PDMP no inconsistencies noted.    - Iron overload from chronic transfusion. Will check ferritin levels. She has received blood transfusions in the past.   - Urinalysis Dipstick - CBC  Chronic pain syndrome Reviewed pain management agreement at length. Patient warrants 2 consecutive negative drug screens in order to continue receiving opiate prescriptions.   [id number] 11+Oxyco+Alc+Crt-Bund - Cannabinoid Conf, Ur - Opiates Confirmation, Urine - Oxycodone/Oxymorphone, Confirm  Polysubstance abuse (HCC) Discussed substance abuse at length.  Rehabilitation offered, patient declined. He says that he smokes marijuana frequently, but denies cocaine use.  - Cannabinoid Conf, Ur - Opiates Confirmation, Urine - Oxycodone/Oxymorphone, Confirm  Influenza vaccine needed - Flu Vaccine QUAD 6+ mos PF IM (Fluarix Quad PF)    Follow-up: Follow up in 2 weeks for UDS   Buchanan, MSN, FNP-C Patient [place], [place] [address] [phone]  This note was prepared using Dragon speech recognition software, errors in dictation are unintentional.

## 2019-01-29 ENCOUNTER — Telehealth: Payer: Self-pay

## 2019-01-29 LAB — CBC
Hematocrit: 23.4 % — ABNORMAL LOW (ref 37.5–51.0)
Hemoglobin: 8 g/dL — ABNORMAL LOW (ref 13.0–17.7)
MCH: 32.5 pg (ref 26.6–33.0)
MCHC: 34.2 g/dL (ref 31.5–35.7)
MCV: 95 fL (ref 79–97)
NRBC: 2 % — ABNORMAL HIGH (ref 0–0)
Platelets: 224 10*3/uL (ref 150–450)
RBC: 2.46 x10E6/uL — CL (ref 4.14–5.80)
RDW: 30.1 % — ABNORMAL HIGH (ref 11.6–15.4)
WBC: 11.1 10*3/uL — ABNORMAL HIGH (ref 3.4–10.8)

## 2019-01-29 NOTE — Telephone Encounter (Signed)
-----   Message from [name], [place] sent at 01/29/2019  2:17 PM EST ----- Regarding: lab results Please inform patient that hemoglobin is 8, which is consistent with his baseline.  There is no clinical indication for blood transfusion at this time.  He is typically not transfused unless his hemoglobin is less than 7.  His urine drug screen is pending, typically takes around 3 business days for results to return.  Therefore, results will not be back prior to Friday or Saturday.  If he has not heard from [place] by next Monday, he can call back at that time.   [name]  APRN, MSN, FNP-C Patient [place] [address] [place], [place] [address] [phone]

## 2019-01-29 NOTE — Telephone Encounter (Signed)
Called and spoke with patient, advised that hemoglobin is 8.0 which is consistent with baseline. Advised that urine is still pending and if he hasn't heard from [place] by Friday or Saturday to call back on Monday. Thanks!

## 2019-02-01 LAB — OXYCODONE/OXYMORPHONE, CONFIRM
OXYCODONE/OXYMORPH: POSITIVE — AB
OXYCODONE: 1828 ng/mL
OXYCODONE: POSITIVE — AB
OXYMORPHONE (GC/MS): >3000 ng/mL
OXYMORPHONE: POSITIVE — AB

## 2019-02-01 LAB — DRUG SCREEN 764883 11+OXYCO+ALC+CRT-BUND
Amphetamines, Urine: NEGATIVE ng/mL
BENZODIAZ UR QL: NEGATIVE ng/mL
Barbiturate: NEGATIVE ng/mL
Cocaine (Metabolite): NEGATIVE ng/mL
Creatinine: 75.1 mg/dL (ref 20.0–300.0)
Ethanol: NEGATIVE %
Meperidine: NEGATIVE ng/mL
Methadone Screen, Urine: NEGATIVE ng/mL
Phencyclidine: NEGATIVE ng/mL
Propoxyphene: NEGATIVE ng/mL
Tramadol: NEGATIVE ng/mL
pH, Urine: 6 (ref 4.5–8.9)

## 2019-02-01 LAB — CANNABINOID CONFIRMATION, UR
CANNABINOIDS: POSITIVE — AB
Carboxy THC GC/MS Conf: >300 ng/mL

## 2019-02-01 LAB — OPIATES CONFIRMATION, URINE: Opiates: NEGATIVE ng/mL

## 2019-02-11 ENCOUNTER — Other Ambulatory Visit: Payer: Medicare Other

## 2019-02-11 ENCOUNTER — Other Ambulatory Visit: Payer: Self-pay

## 2019-02-11 ENCOUNTER — Telehealth: Payer: Self-pay | Admitting: Family Medicine

## 2019-02-11 DIAGNOSIS — F1910 Other psychoactive substance abuse, uncomplicated: Secondary | ICD-10-CM

## 2019-02-12 ENCOUNTER — Other Ambulatory Visit: Payer: Self-pay | Admitting: Family Medicine

## 2019-02-12 DIAGNOSIS — G894 Chronic pain syndrome: Secondary | ICD-10-CM

## 2019-02-12 DIAGNOSIS — D571 Sickle-cell disease without crisis: Secondary | ICD-10-CM

## 2019-02-12 MED ORDER — OXYCODONE HCL ER 15 MG PO T12A: 15.0000 mg | EXTENDED_RELEASE_TABLET | Freq: Two times a day (BID) | ORAL | 0 refills | Status: DC

## 2019-02-12 MED ORDER — OXYCODONE HCL 10 MG PO TABS: 10.0000 mg | ORAL_TABLET | Freq: Four times a day (QID) | ORAL | 0 refills | Status: DC | PRN

## 2019-02-12 NOTE — Progress Notes (Signed)
Reviewed PDMP substance reporting system prior to prescribing opiate medications. No inconsistencies noted.   Meds ordered this encounter  Medications  . oxyCODONE (OXYCONTIN) 15 mg 12 hr tablet    Sig: Take 1 tablet (15 mg total) by mouth every 12 (twelve) hours.    Dispense:  30 tablet    Refill:  0    Order Specific Question:   Supervising Provider    Answer:   [name] [id number]  . Oxycodone HCl 10 MG TABS    Sig: Take 1 tablet (10 mg total) by mouth every 6 (six) hours as needed.    Dispense:  60 tablet    Refill:  0    Order Specific Question:   Supervising Provider    Answer:   [name] [id number]     [name]  APRN, MSN, FNP-C [name] [address] [place],  [address] [phone]

## 2019-02-15 LAB — DRUG SCREEN 764883 11+OXYCO+ALC+CRT-BUND
Amphetamines, Urine: NEGATIVE ng/mL
BENZODIAZ UR QL: NEGATIVE ng/mL
Barbiturate: NEGATIVE ng/mL
Cocaine (Metabolite): NEGATIVE ng/mL
Creatinine: 45.4 mg/dL (ref 20.0–300.0)
Ethanol: NEGATIVE %
Meperidine: NEGATIVE ng/mL
Methadone Screen, Urine: NEGATIVE ng/mL
OPIATE SCREEN URINE: NEGATIVE ng/mL
Phencyclidine: NEGATIVE ng/mL
Propoxyphene: NEGATIVE ng/mL
Tramadol: NEGATIVE ng/mL
pH, Urine: 7.4 (ref 4.5–8.9)

## 2019-02-15 LAB — CANNABINOID CONFIRMATION, UR
CANNABINOIDS: POSITIVE — AB
Carboxy THC GC/MS Conf: 300 ng/mL

## 2019-02-15 LAB — OXYCODONE/OXYMORPHONE, CONFIRM: OXYCODONE/OXYMORPH: NEGATIVE

## 2019-02-17 NOTE — Telephone Encounter (Signed)
done

## 2019-02-24 ENCOUNTER — Ambulatory Visit
Admission: RE | Admit: 2019-02-24 | Discharge: 2019-02-24 | Disposition: A | Discharge status: Home / Self Care | Payer: Medicare Other | Source: Ambulatory Visit | Attending: Orthopaedic Surgery | Admitting: Orthopaedic Surgery

## 2019-02-24 DIAGNOSIS — M7989 Other specified soft tissue disorders: Secondary | ICD-10-CM | POA: Diagnosis not present

## 2019-02-24 DIAGNOSIS — M79672 Pain in left foot: Secondary | ICD-10-CM

## 2019-02-24 IMAGING — CR DG CHEST 2V
2 series · 2 of 2 positions shown · non-contrast
Comparison: 07/23/2017, 05/15/2017, 05/12/2017 and chest CTA dated
02/27/2017.

CLINICAL DATA: Shortness of breath.  Sickle cell crisis.  Smoker.

EXAM:
CHEST - 2 VIEW

[w chest pa]
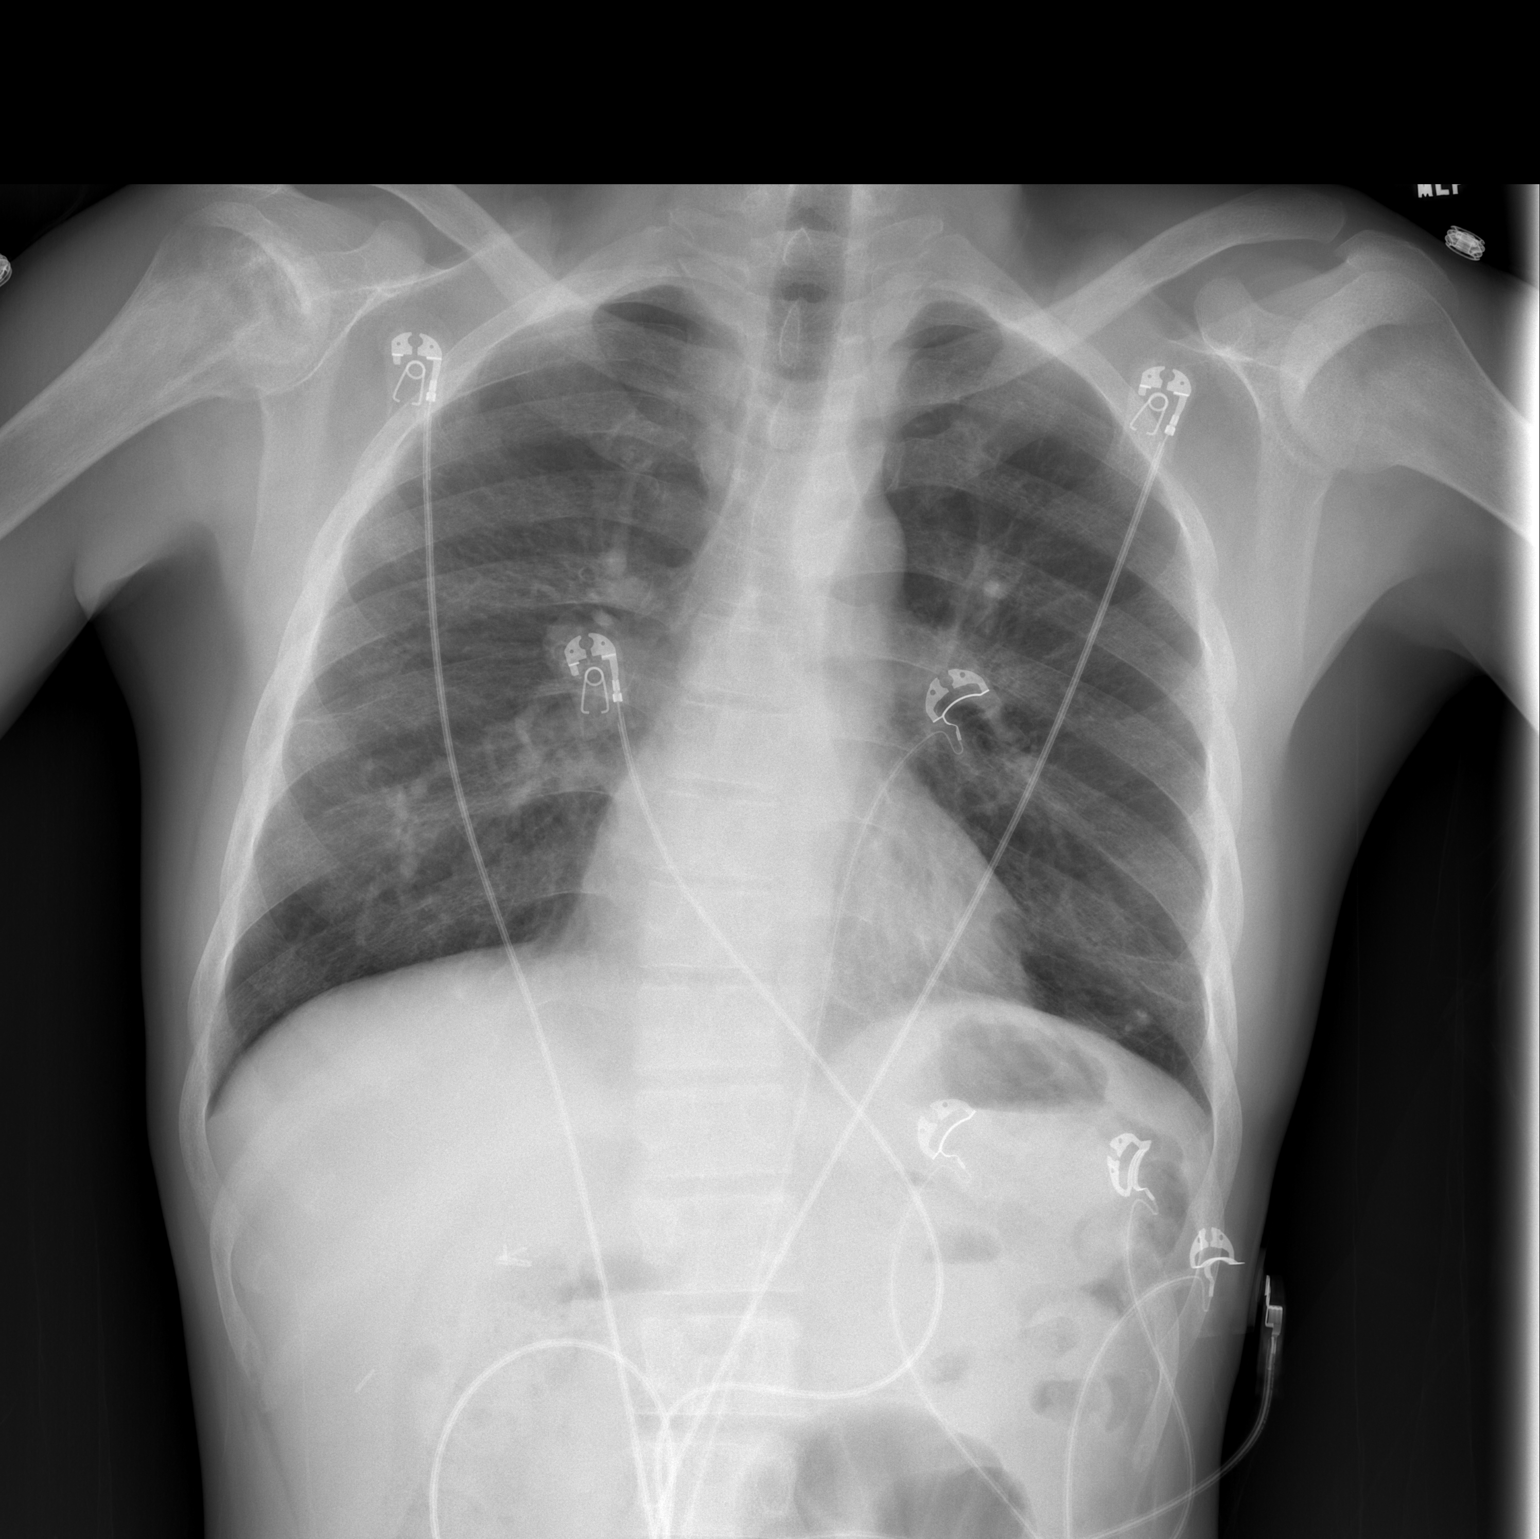

[w chest lat]
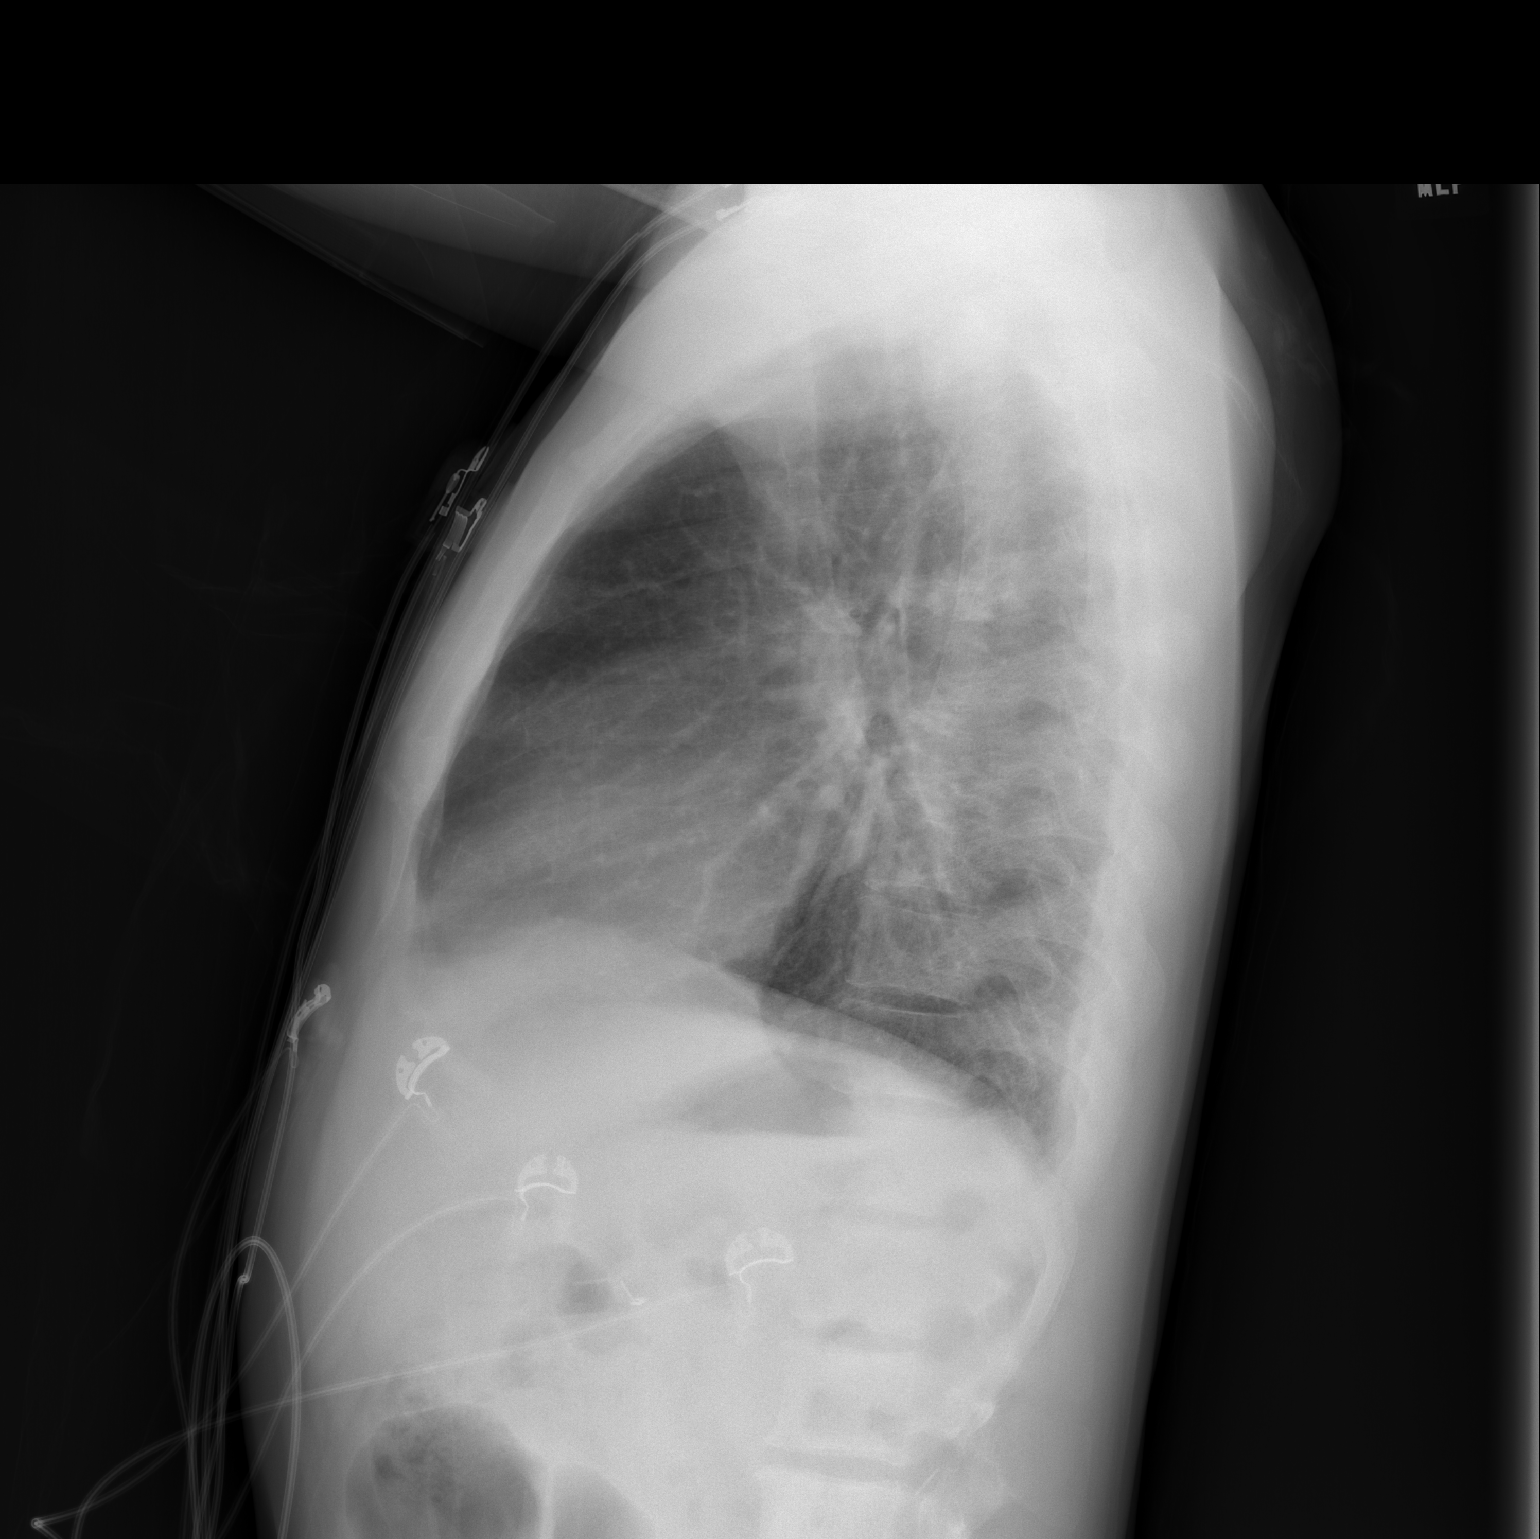

[2 of 2 positions shown; findings below may reference images not displayed]

FINDINGS: Small nodular densities are again demonstrated in both lower lung
zones, primarily due to vessels viewed on end. Otherwise, clear
lungs. Mild peribronchial thickening. Cholecystectomy clips. Mild
diffuse bone sclerosis.
IMPRESSION: 1. No acute abnormality.
2. Stable bone sclerosis compatible with the history of sickle cell
disease.

## 2019-02-27 ENCOUNTER — Ambulatory Visit: Payer: Medicare Other | Admitting: Orthopaedic Surgery

## 2019-03-04 ENCOUNTER — Ambulatory Visit: Payer: Medicare Other | Admitting: Orthopaedic Surgery

## 2019-03-05 ENCOUNTER — Encounter: Payer: Self-pay | Admitting: Orthopaedic Surgery

## 2019-03-05 ENCOUNTER — Ambulatory Visit (INDEPENDENT_AMBULATORY_CARE_PROVIDER_SITE_OTHER): Payer: Medicare Other | Admitting: Orthopaedic Surgery

## 2019-03-05 ENCOUNTER — Other Ambulatory Visit: Payer: Self-pay

## 2019-03-05 VITALS — Ht 63.0 in | Wt 128.0 lb

## 2019-03-05 DIAGNOSIS — D571 Sickle-cell disease without crisis: Secondary | ICD-10-CM

## 2019-03-05 DIAGNOSIS — N08 Glomerular disorders in diseases classified elsewhere: Secondary | ICD-10-CM | POA: Diagnosis not present

## 2019-03-05 DIAGNOSIS — M79672 Pain in left foot: Secondary | ICD-10-CM | POA: Diagnosis not present
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
## 2019-03-10 ENCOUNTER — Ambulatory Visit: Payer: Medicare Other | Admitting: Family Medicine

## 2019-03-11 ENCOUNTER — Encounter: Payer: Self-pay | Admitting: Family Medicine

## 2019-03-11 ENCOUNTER — Other Ambulatory Visit: Payer: Self-pay

## 2019-03-11 ENCOUNTER — Ambulatory Visit (INDEPENDENT_AMBULATORY_CARE_PROVIDER_SITE_OTHER): Payer: Medicare Other | Admitting: Family Medicine

## 2019-03-11 VITALS — BP 118/67 | HR 110 | Temp 98.9°F | Resp 16 | Ht 63.0 in | Wt 132.0 lb

## 2019-03-11 DIAGNOSIS — D571 Sickle-cell disease without crisis: Secondary | ICD-10-CM

## 2019-03-11 DIAGNOSIS — G894 Chronic pain syndrome: Secondary | ICD-10-CM

## 2019-03-11 DIAGNOSIS — E559 Vitamin D deficiency, unspecified: Secondary | ICD-10-CM

## 2019-03-11 LAB — POCT URINALYSIS DIPSTICK
Bilirubin, UA: NEGATIVE
Glucose, UA: NEGATIVE
Ketones, UA: NEGATIVE
Leukocytes, UA: NEGATIVE
Nitrite, UA: NEGATIVE
Protein, UA: POSITIVE — AB
Spec Grav, UA: 1.015 (ref 1.010–1.025)
Urobilinogen, UA: 0.2 E.U./dL
pH, UA: 7 (ref 5.0–8.0)

## 2019-03-11 MED ORDER — OXYCODONE HCL ER 15 MG PO T12A: 15.0000 mg | EXTENDED_RELEASE_TABLET | Freq: Two times a day (BID) | ORAL | 0 refills | Status: DC

## 2019-03-11 MED ORDER — OXYCODONE HCL 10 MG PO TABS: 10.0000 mg | ORAL_TABLET | Freq: Four times a day (QID) | ORAL | 0 refills | Status: DC | PRN

## 2019-03-11 MED ORDER — FOLIC ACID 1 MG PO TABS: 1.0000 mg | ORAL_TABLET | Freq: Every day | ORAL | 12 refills | Status: DC

## 2019-03-11 NOTE — Progress Notes (Signed)
Patient [facility]    Subjective:  Patient ID: [name] [name]., male    DOB: 23-Feb-1980  Age: 39 y.o. MRN: [id number]  CC:  Chief Complaint  Patient presents with  . Sickle Cell Anemia    HPI [name] [name]., a 39 year old male with a medical history significant for sickle cell disease, syndrome, opiate dependence and tolerance, chronic kidney disease stage IIIb, immune complex glomerulonephritis, history of migraine headaches, and history of polysubstance abuse presents for follow-up of sickle cell disease.  Patient states that he feels well and has minimal complaints on today.  He has a history of chronic pain and is controlled with opiate medications.  He has been out of medications over the past several days.  Chronic pain is primarily to bilateral lower extremities and is rated 6/10.  He last had Tylenol this a.m. with some relief.  He denies headache, chest pain, shortness of breath, urinary symptoms, nausea, vomiting, or diarrhea.  Patient is followed by nephrology for glomerulonephritis. He denies fever, chills, sick contacts, or exposure to covid 19.   Past Medical History:  Diagnosis Date  . Abscess of right iliac fossa 09/24/2018   required Perc Drain 09/24/2018  . Arachnoid Cyst of brain bilaterally    "2 really small ones in the back of my head; inside; saw them w/MRI" (09/25/2012)  . Bacterial pneumonia ~ 2012   "caught it here in the hospital" (09/25/2012)  . Chronic kidney disease    "from my sickle cell" (09/25/2012)  . CKD (chronic kidney disease), stage II   . Colitis 04/19/2017   CT scan abd/ pelvis  . GERD (gastroesophageal reflux disease)    "after I eat alot of spicey foods" (09/25/2012)  . Gynecomastia, male 07/10/2012  . History of blood transfusion    "always related to sickle cell crisis" (09/25/2012)  . 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   Noted in noted from Hematology notes at [facility]   . Migraines    "take RX qd to prevent them" (09/25/2012)  . Opioid dependence with withdrawal ([place]) 08/30/2012  . Renal insufficiency   . Sickle cell anemia (HCC)   . Sickle cell crisis ([place]) 09/25/2012  . Sickle cell nephropathy ([place]) 07/10/2012  . Sinus tachycardia   . Tachycardia with heart rate 121-140 beats per minute with ambulation 08/04/2016    Past Surgical History:  Procedure Laterality Date  . ABCESS DRAINAGE    . CHOLECYSTECTOMY  ~ 2012  . COLONOSCOPY N/A 04/23/2017   Procedure: COLONOSCOPY;  Surgeon: Irene Shipper, MD;  Location: [facility];  Service: Endoscopy;  Laterality: N/A;  . IR FLUORO GUIDE CV LINE RIGHT  12/17/2016  . IR RADIOLOGIST EVAL & MGMT  10/02/2018  . IR RADIOLOGIST EVAL & MGMT  10/15/2018  . IR REMOVAL TUN CV CATH W/O FL  12/21/2016  . IR US GUIDE VASC ACCESS RIGHT  12/17/2016  . spleenectomy      Family History  Problem Relation Age of Onset  . Breast cancer Mother     Social History   Socioeconomic History  . Marital status: Single    Spouse name: Not on file  . Number of children: Not on file  . Years of education: Not on file  . Highest education level: Not on file  Occupational History  . Not on file  Tobacco Use  . Smoking status: Current Some Day Smoker    Packs/day: 0.10    Types:  Cigarettes  . Smokeless tobacco: Never Used  . Tobacco comment: 09/25/2012 "I don't buy cigarettes; bum one from friends q now and then"  Substance and Sexual Activity  . Alcohol use: Yes    Alcohol/week: 0.0 standard drinks    Comment: Once a month  . Drug use: Not Currently    Types: Marijuana    Comment: Every 2-3 weeks   . Sexual activity: Yes  Other Topics Concern  . Not on file  Social History Narrative    Lives alone in [place].   Back at school, studying Public relations account executive. Unemployed.    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    Social Determinants of Health   Financial Resource Strain:   . Difficulty of Paying Living Expenses: Not on file  Food Insecurity:   . Worried About Charity fundraiser in the Last Year: Not on file  . Ran Out of Food in the Last Year: Not on file  Transportation Needs:   . Lack of Transportation (Medical): Not on file  . Lack of Transportation (Non-Medical): Not on file  Physical Activity:   . Days of Exercise per Week: Not on file  . Minutes of Exercise per Session: Not on file  Stress:   . Feeling of Stress : Not on file  Social Connections:   . Frequency of Communication with Friends and Family: Not on file  . Frequency of Social Gatherings with Friends and Family: Not on file  . Attends Religious Services: Not on file  . Active Member of Clubs or Organizations: Not on file  . Attends Archivist Meetings: Not on file  . Marital Status: Not on file  Intimate Partner Violence:   . Fear of Current or Ex-Partner: Not on file  . Emotionally Abused: Not on file  . Physically Abused: Not on file  . Sexually Abused: Not on file    Outpatient Medications Prior to Visit  Medication Sig Dispense Refill  . nortriptyline (PAMELOR) 25 MG capsule Take 1 capsule (25 mg total) by mouth at bedtime as needed for sleep. 90 capsule 3  . sodium bicarbonate 650 MG tablet Take 650 mg by mouth 2 (two) times daily.    Marland Kitchen oxyCODONE (OXYCONTIN) 15 mg 12 hr tablet Take 1 tablet (15 mg total) by mouth every 12 (twelve) hours. 30 tablet 0  . Oxycodone HCl 10 MG TABS Take 1 tablet (10 mg total) by mouth every 6 (six) hours as needed. 60 tablet 0  . cholecalciferol (VITAMIN D3) 25 MCG (1000 UT) tablet Take 2,000 Units by mouth daily.    . hydroxyurea (HYDREA) 500 MG capsule TAKE 3 CAPSULES BY MOUTH EVERY DAY (Patient not taking: Reported on 03/11/2019) 270 capsule 1  . tacrolimus (PROGRAF) 1 MG capsule Take 2 mg by mouth 2 (two) times daily.    . folic acid  (FOLVITE) 1 MG tablet Take 1 tablet (1 mg total) daily by mouth. (Patient not taking: Reported on 03/11/2019) 30 tablet 2   No facility-administered medications prior to visit.    Allergies  Allergen Reactions  . Nsaids Other (See Comments)    Kidney disease  . Morphine And Related Other (See Comments)    "Real bad headaches"     ROS Review of Systems  Constitutional: Negative for activity change and appetite change.  HENT: Negative.  Respiratory: Negative.   Cardiovascular: Negative.   Gastrointestinal: Negative.   Endocrine: Negative.   Genitourinary: Negative.   Musculoskeletal: Positive for arthralgias and back pain.  Skin: Negative.   Allergic/Immunologic: Negative.   Neurological: Negative.   Hematological: Negative.   Psychiatric/Behavioral: Negative.       Objective:    Physical Exam  Constitutional: He is oriented to person, place, and time. He appears well-developed.  HENT:  Head: Normocephalic and atraumatic.  Eyes: Pupils are equal, round, and reactive to light.  Cardiovascular: Normal rate and regular rhythm.  Pulmonary/Chest: Effort normal and breath sounds normal.  Abdominal: Soft. Bowel sounds are normal.  Musculoskeletal:        General: Normal range of motion.     Cervical back: Normal range of motion.  Neurological: He is alert and oriented to person, place, and time.  Skin: Skin is warm and dry.  Psychiatric: He has a normal mood and affect. His behavior is normal. Judgment and thought content normal.    BP 118/67 (BP Location: Right Arm, Patient Position: Sitting, Cuff Size: Normal)   Pulse (!) 110   Temp 98.9 F (37.2 C) (Oral)   Resp 16   Ht 5\' 3"  (1.6 m)   Wt 132 lb (59.9 kg)   SpO2 98%   BMI 23.38 kg/m  Wt Readings from Last 3 Encounters:  03/11/19 132 lb (59.9 kg)  03/05/19 128 lb (58.1 kg)  01/28/19 128 lb (58.1 kg)     There are no preventive care reminders to display for this patient.  There are no preventive care  reminders to display for this patient.  Lab Results  Component Value Date   TSH 2.860 05/11/2017   Lab Results  Component Value Date   WBC 11.1 (H) 01/28/2019   HGB 8.0 (L) 01/28/2019   HCT 23.4 (L) 01/28/2019   MCV 95 01/28/2019   PLT 224 01/28/2019   Lab Results  Component Value Date   NA 133 (L) 01/25/2019   K 4.3 01/25/2019   CO2 15 (L) 01/25/2019   GLUCOSE 100 (H) 01/25/2019   BUN 30 (H) 01/25/2019   CREATININE 2.20 (H) 01/25/2019   BILITOT 3.1 (H) 01/25/2019   ALKPHOS 239 (H) 01/25/2019   AST 75 (H) 01/25/2019   ALT 60 (H) 01/25/2019   PROT 6.1 (L) 01/25/2019   ALBUMIN 3.0 (L) 01/25/2019   CALCIUM 8.7 (L) 01/25/2019   ANIONGAP 9 01/25/2019   Lab Results  Component Value Date   CHOL 145 03/07/2018   Lab Results  Component Value Date   HDL 65 03/07/2018   Lab Results  Component Value Date   LDLCALC 30 03/07/2018   Lab Results  Component Value Date   TRIG 251 (H) 03/07/2018   Lab Results  Component Value Date   CHOLHDL 2.2 03/07/2018   Lab Results  Component Value Date   HGBA1C <4.2 (L) 03/07/2018      Assessment & Plan:   Problem List Items Addressed This Visit      Other   Vitamin D deficiency   Relevant Orders   Vitamin D, 25-hydroxy   Chronic pain syndrome   Relevant Medications   oxyCODONE (OXYCONTIN) 15 mg 12 hr tablet   Oxycodone HCl 10 MG TABS   Other Relevant Orders   [id number] 11+Oxyco+Alc+Crt-Bund    Other Visit Diagnoses    Hb-SS disease without crisis ([place])    -  Primary   Relevant Medications   oxyCODONE (OXYCONTIN) 15 mg 12 hr tablet  Oxycodone HCl 10 MG TABS   folic acid (FOLVITE) 1 MG tablet   Other Relevant Orders   Urinalysis Dipstick (Completed)   Basic Metabolic Panel   CBC with Differential      Meds ordered this encounter  Medications  . oxyCODONE (OXYCONTIN) 15 mg 12 hr tablet    Sig: Take 1 tablet (15 mg total) by mouth every 12 (twelve) hours.    Dispense:  60 tablet    Refill:  0    Order Specific  Question:   Supervising Provider    Answer:   [name] [id number]  . Oxycodone HCl 10 MG TABS    Sig: Take 1 tablet (10 mg total) by mouth every 6 (six) hours as needed.    Dispense:  60 tablet    Refill:  0    Order Specific Question:   Supervising Provider    Answer:   [name] [id number]  . folic acid (FOLVITE) 1 MG tablet    Sig: Take 1 tablet (1 mg total) by mouth daily.    Dispense:  30 tablet    Refill:  12    Order Specific Question:   Supervising Provider    Answer:   [name] [id number]    Hb-SS disease without crisis ([place]) Sickle cell disease - Continue Hydrea. We discussed the need for good hydration, monitoring of hydration status, avoidance of heat, cold, stress, and infection triggers. We discussed the risks and benefits of Hydrea, including bone marrow suppression, the possibility of GI upset, skin ulcers, hair thinning, and teratogenicity. The patient was reminded of the need to seek medical attention of any symptoms of bleeding, anemia, or infection. Continue folic acid 1 mg daily to prevent aplastic bone marrow crises.   Pulmonary evaluation - Patient denies severe recurrent wheezes, shortness of breath with exercise, or persistent cough. If these symptoms develop, pulmonary function tests with spirometry will be ordered, and if abnormal, plan on referral to Pulmonology for further evaluation.  Cardiac - Routine screening for pulmonary hypertension is not recommended.  Eye - High risk of proliferative retinopathy. Annual eye exam with retinal exam recommended to patient.    Immunization status - Discussed receiving COVID vaccination at length   Acute and chronic painful episodes -  Controlled substance agreement signed previously. We reminded [place] that all patients receiving Schedule II narcotics must be seen for follow within one month of prescription being requested. We reviewed the terms of our pain agreement, including the need to  keep medicines in a safe locked location away from children or pets, and the need to report excess sedation or constipation, measures to avoid constipation, and policies related to early refills and stolen prescriptions. According to the [place] [facility] program, we have reviewed details related to analgesia, adverse effects, aberrant behaviors.     - Urinalysis Dipstick - Basic Metabolic Panel - oxyCODONE (OXYCONTIN) 15 mg 12 hr tablet; Take 1 tablet (15 mg total) by mouth every 12 (twelve) hours.  Dispense: 60 tablet; Refill: 0 - Oxycodone HCl 10 MG TABS; Take 1 tablet (10 mg total) by mouth every 6 (six) hours as needed.  Dispense: 60 tablet; Refill: 0 - folic acid (FOLVITE) 1 MG tablet; Take 1 tablet (1 mg total) by mouth daily.  Dispense: 30 tablet; Refill: 12 - CBC with Differential  2. Chronic pain syndrome Reviewed PDMP substance reporting system prior to prescribing opiate medications. No inconsistencies noted.  [id number] 11+Oxyco+Alc+Crt-Bund - oxyCODONE (OXYCONTIN) 15  mg 12 hr tablet; Take 1 tablet (15 mg total) by mouth every 12 (twelve) hours.  Dispense: 60 tablet; Refill: 0  3. Vitamin D deficiency - Vitamin D, 25-hydroxy   Follow-up: Return in about 2 months (around 05/09/2019) for sickle cell anemia.    [name]  APRN, MSN, FNP-C Patient [facility] [address] [place], [place] [address] [phone]

## 2019-03-11 NOTE — Patient Instructions (Signed)
COVID-19 Vaccine Information can be found at: [email or web address] For questions related to vaccine distribution or appointments, please email [email or web address] or call [phone].    

## 2019-03-12 LAB — BASIC METABOLIC PANEL
BUN/Creatinine Ratio: 9 (ref 9–20)
BUN: 26 mg/dL — ABNORMAL HIGH (ref 6–20)
CO2: 16 mmol/L — ABNORMAL LOW (ref 20–29)
Calcium: 8.8 mg/dL (ref 8.7–10.2)
Chloride: 103 mmol/L (ref 96–106)
Creatinine, Ser: 2.82 mg/dL — ABNORMAL HIGH (ref 0.76–1.27)
GFR calc Af Amer: 32 mL/min/{1.73_m2} — ABNORMAL LOW (ref >59)
GFR calc non Af Amer: 28 mL/min/{1.73_m2} — ABNORMAL LOW (ref >59)
Glucose: 79 mg/dL (ref 65–99)
Potassium: 4.7 mmol/L (ref 3.5–5.2)
Sodium: 134 mmol/L (ref 134–144)

## 2019-03-12 LAB — CBC WITH DIFFERENTIAL/PLATELET
Basophils Absolute: 0.1 10*3/uL (ref 0.0–0.2)
Basos: 0 %
EOS (ABSOLUTE): 0.4 10*3/uL (ref 0.0–0.4)
Eos: 2 %
Hematocrit: 19.3 % — ABNORMAL LOW (ref 37.5–51.0)
Hemoglobin: 6.6 g/dL — CL (ref 13.0–17.7)
Immature Grans (Abs): 0.2 10*3/uL — ABNORMAL HIGH (ref 0.0–0.1)
Immature Granulocytes: 1 %
Lymphocytes Absolute: 6.2 10*3/uL — ABNORMAL HIGH (ref 0.7–3.1)
Lymphs: 31 %
MCH: 32.7 pg (ref 26.6–33.0)
MCHC: 34.2 g/dL (ref 31.5–35.7)
MCV: 96 fL (ref 79–97)
Monocytes Absolute: 2 10*3/uL — ABNORMAL HIGH (ref 0.1–0.9)
Monocytes: 10 %
NRBC: 2 % — ABNORMAL HIGH (ref 0–0)
Neutrophils Absolute: 10.9 10*3/uL — ABNORMAL HIGH (ref 1.4–7.0)
Neutrophils: 56 %
Platelets: 235 10*3/uL (ref 150–450)
RBC: 2.02 x10E6/uL — CL (ref 4.14–5.80)
RDW: 27.5 % — ABNORMAL HIGH (ref 11.6–15.4)
WBC: 19.8 10*3/uL — ABNORMAL HIGH (ref 3.4–10.8)

## 2019-03-12 LAB — VITAMIN D 25 HYDROXY (VIT D DEFICIENCY, FRACTURES): Vit D, 25-Hydroxy: 15.6 ng/mL — ABNORMAL LOW (ref 30.0–100.0)

## 2019-03-13 ENCOUNTER — Other Ambulatory Visit: Payer: Self-pay | Admitting: Family Medicine

## 2019-03-13 ENCOUNTER — Telehealth: Payer: Self-pay

## 2019-03-13 NOTE — Telephone Encounter (Signed)
Called no answer. Left a message labs are stable and to call if any questions. Thanks!

## 2019-03-13 NOTE — Telephone Encounter (Signed)
-----   Message from [name], [place] sent at 03/13/2019  8:41 AM EST ----- Regarding: lab results Please inform patient that labs are consistent with his baseline. Kidney function appears to be stable, follow up with nephrologist as scheduled. Please fax labs to nephrology.   [name]  APRN, MSN, FNP-C Patient [facility] [address] [place], [place] [address] [phone]

## 2019-03-14 DIAGNOSIS — D631 Anemia in chronic kidney disease: Secondary | ICD-10-CM | POA: Diagnosis not present

## 2019-03-14 DIAGNOSIS — E872 Acidosis: Secondary | ICD-10-CM | POA: Diagnosis not present

## 2019-03-14 DIAGNOSIS — E559 Vitamin D deficiency, unspecified: Secondary | ICD-10-CM | POA: Diagnosis not present

## 2019-03-14 DIAGNOSIS — N049 Nephrotic syndrome with unspecified morphologic changes: Secondary | ICD-10-CM | POA: Diagnosis not present

## 2019-03-14 DIAGNOSIS — N1832 Chronic kidney disease, stage 3b: Secondary | ICD-10-CM | POA: Diagnosis not present

## 2019-03-14 DIAGNOSIS — D5700 Hb-SS disease with crisis, unspecified: Secondary | ICD-10-CM | POA: Diagnosis not present

## 2019-03-15 LAB — DRUG SCREEN 764883 11+OXYCO+ALC+CRT-BUND
Amphetamines, Urine: NEGATIVE ng/mL
BENZODIAZ UR QL: NEGATIVE ng/mL
Barbiturate: NEGATIVE ng/mL
Cocaine (Metabolite): NEGATIVE ng/mL
Creatinine: 43.1 mg/dL (ref 20.0–300.0)
Ethanol: NEGATIVE %
Meperidine: NEGATIVE ng/mL
Methadone Screen, Urine: NEGATIVE ng/mL
OPIATE SCREEN URINE: NEGATIVE ng/mL
Oxycodone/Oxymorphone, Urine: NEGATIVE ng/mL
Phencyclidine: NEGATIVE ng/mL
Propoxyphene: NEGATIVE ng/mL
Tramadol: NEGATIVE ng/mL
pH, Urine: 6.8 (ref 4.5–8.9)

## 2019-03-15 LAB — CANNABINOID CONFIRMATION, UR
CANNABINOIDS: POSITIVE — AB
Carboxy THC GC/MS Conf: 197 ng/mL

## 2019-03-18 DIAGNOSIS — Z5181 Encounter for therapeutic drug level monitoring: Secondary | ICD-10-CM | POA: Diagnosis not present

## 2019-03-18 DIAGNOSIS — M25474 Effusion, right foot: Secondary | ICD-10-CM | POA: Diagnosis not present

## 2019-03-18 DIAGNOSIS — E790 Hyperuricemia without signs of inflammatory arthritis and tophaceous disease: Secondary | ICD-10-CM | POA: Diagnosis not present

## 2019-03-18 DIAGNOSIS — D571 Sickle-cell disease without crisis: Secondary | ICD-10-CM | POA: Diagnosis not present

## 2019-03-18 DIAGNOSIS — M109 Gout, unspecified: Secondary | ICD-10-CM | POA: Diagnosis not present

## 2019-03-18 DIAGNOSIS — R7989 Other specified abnormal findings of blood chemistry: Secondary | ICD-10-CM | POA: Diagnosis not present

## 2019-03-19 ENCOUNTER — Telehealth (HOSPITAL_COMMUNITY): Payer: Self-pay

## 2019-03-19 NOTE — Telephone Encounter (Signed)
[name] [name] called report sickle cell pain to legs and arms; rate pain 7/10.  Patient also state he had an appointment 03/18/19  at [facility] and lab results were low. Last medication taken was 15 mg of OxyContin last night at 7 pm. Also reported fatigue and shortness of breath. Did not report fever, chest pain, diarrhea, abdominal pain, priapism. Did report one episode of emesis last evening. Chart reviewed with provider and advise to go to ED to be cleared before being seen by day hospital. Patient gave push back.  Second phone call to explain that he requires a higher level of care and we cannot offer him that in the day hospital at this time. Patient verbalized understanding.

## 2019-03-21 ENCOUNTER — Encounter (HOSPITAL_COMMUNITY): Payer: Self-pay

## 2019-03-21 ENCOUNTER — Inpatient Hospital Stay (HOSPITAL_COMMUNITY)
Admission: EM | Admit: 2019-03-21 | Discharge: 2019-03-22 | DRG: 812 | Discharge status: Against Medical Advice | Payer: Medicare Other | Attending: Internal Medicine | Admitting: Internal Medicine

## 2019-03-21 ENCOUNTER — Other Ambulatory Visit: Payer: Self-pay

## 2019-03-21 DIAGNOSIS — Z79899 Other long term (current) drug therapy: Secondary | ICD-10-CM

## 2019-03-21 DIAGNOSIS — M109 Gout, unspecified: Secondary | ICD-10-CM | POA: Diagnosis present

## 2019-03-21 DIAGNOSIS — Z7952 Long term (current) use of systemic steroids: Secondary | ICD-10-CM

## 2019-03-21 DIAGNOSIS — N08 Glomerular disorders in diseases classified elsewhere: Secondary | ICD-10-CM | POA: Diagnosis present

## 2019-03-21 DIAGNOSIS — D649 Anemia, unspecified: Secondary | ICD-10-CM

## 2019-03-21 DIAGNOSIS — Z5329 Procedure and treatment not carried out because of patient's decision for other reasons: Secondary | ICD-10-CM | POA: Diagnosis not present

## 2019-03-21 DIAGNOSIS — G894 Chronic pain syndrome: Secondary | ICD-10-CM | POA: Diagnosis present

## 2019-03-21 DIAGNOSIS — K219 Gastro-esophageal reflux disease without esophagitis: Secondary | ICD-10-CM | POA: Diagnosis present

## 2019-03-21 DIAGNOSIS — Z9049 Acquired absence of other specified parts of digestive tract: Secondary | ICD-10-CM | POA: Diagnosis not present

## 2019-03-21 DIAGNOSIS — Z888 Allergy status to other drugs, medicaments and biological substances status: Secondary | ICD-10-CM | POA: Diagnosis not present

## 2019-03-21 DIAGNOSIS — N184 Chronic kidney disease, stage 4 (severe): Secondary | ICD-10-CM | POA: Diagnosis present

## 2019-03-21 DIAGNOSIS — N058 Unspecified nephritic syndrome with other morphologic changes: Secondary | ICD-10-CM | POA: Diagnosis not present

## 2019-03-21 DIAGNOSIS — D5700 Hb-SS disease with crisis, unspecified: Principal | ICD-10-CM | POA: Diagnosis present

## 2019-03-21 DIAGNOSIS — D631 Anemia in chronic kidney disease: Secondary | ICD-10-CM | POA: Diagnosis present

## 2019-03-21 DIAGNOSIS — R011 Cardiac murmur, unspecified: Secondary | ICD-10-CM | POA: Diagnosis not present

## 2019-03-21 DIAGNOSIS — Z03818 Encounter for observation for suspected exposure to other biological agents ruled out: Secondary | ICD-10-CM | POA: Diagnosis not present

## 2019-03-21 DIAGNOSIS — N059 Unspecified nephritic syndrome with unspecified morphologic changes: Secondary | ICD-10-CM | POA: Diagnosis present

## 2019-03-21 DIAGNOSIS — D72829 Elevated white blood cell count, unspecified: Secondary | ICD-10-CM | POA: Diagnosis not present

## 2019-03-21 DIAGNOSIS — Z79891 Long term (current) use of opiate analgesic: Secondary | ICD-10-CM | POA: Diagnosis not present

## 2019-03-21 DIAGNOSIS — Z87448 Personal history of other diseases of urinary system: Secondary | ICD-10-CM

## 2019-03-21 DIAGNOSIS — F17200 Nicotine dependence, unspecified, uncomplicated: Secondary | ICD-10-CM | POA: Diagnosis not present

## 2019-03-21 DIAGNOSIS — F17210 Nicotine dependence, cigarettes, uncomplicated: Secondary | ICD-10-CM | POA: Diagnosis present

## 2019-03-21 DIAGNOSIS — M79672 Pain in left foot: Secondary | ICD-10-CM | POA: Diagnosis not present

## 2019-03-21 DIAGNOSIS — Z20822 Contact with and (suspected) exposure to covid-19: Secondary | ICD-10-CM | POA: Diagnosis present

## 2019-03-21 DIAGNOSIS — Z885 Allergy status to narcotic agent status: Secondary | ICD-10-CM

## 2019-03-21 DIAGNOSIS — F1290 Cannabis use, unspecified, uncomplicated: Secondary | ICD-10-CM | POA: Diagnosis present

## 2019-03-21 DIAGNOSIS — D638 Anemia in other chronic diseases classified elsewhere: Secondary | ICD-10-CM

## 2019-03-21 LAB — CBC WITH DIFFERENTIAL/PLATELET
Abs Immature Granulocytes: 0.24 10*3/uL — ABNORMAL HIGH (ref 0.00–0.07)
Basophils Absolute: 0 10*3/uL (ref 0.0–0.1)
Basophils Relative: 0 %
Eosinophils Absolute: 0 10*3/uL (ref 0.0–0.5)
Eosinophils Relative: 0 %
HCT: 15.7 % — ABNORMAL LOW (ref 39.0–52.0)
Hemoglobin: 5.4 g/dL — CL (ref 13.0–17.0)
Immature Granulocytes: 1 %
Lymphocytes Relative: 33 %
Lymphs Abs: 7.5 10*3/uL — ABNORMAL HIGH (ref 0.7–4.0)
MCH: 34 pg (ref 26.0–34.0)
MCHC: 34.4 g/dL (ref 30.0–36.0)
MCV: 98.7 fL (ref 80.0–100.0)
Monocytes Absolute: 1.3 10*3/uL — ABNORMAL HIGH (ref 0.1–1.0)
Monocytes Relative: 6 %
Neutro Abs: 13.5 10*3/uL — ABNORMAL HIGH (ref 1.7–7.7)
Neutrophils Relative %: 60 %
Platelets: 186 10*3/uL (ref 150–400)
RBC: 1.59 MIL/uL — ABNORMAL LOW (ref 4.22–5.81)
RDW: 31.1 % — ABNORMAL HIGH (ref 11.5–15.5)
WBC: 22.5 10*3/uL — ABNORMAL HIGH (ref 4.0–10.5)
nRBC: 6.4 % — ABNORMAL HIGH (ref 0.0–0.2)

## 2019-03-21 LAB — COMPREHENSIVE METABOLIC PANEL
ALT: 34 U/L (ref 0–44)
AST: 54 U/L — ABNORMAL HIGH (ref 15–41)
Albumin: 3.2 g/dL — ABNORMAL LOW (ref 3.5–5.0)
Alkaline Phosphatase: 232 U/L — ABNORMAL HIGH (ref 38–126)
Anion gap: 10 (ref 5–15)
BUN: 33 mg/dL — ABNORMAL HIGH (ref 6–20)
CO2: 22 mmol/L (ref 22–32)
Calcium: 8.8 mg/dL — ABNORMAL LOW (ref 8.9–10.3)
Chloride: 104 mmol/L (ref 98–111)
Creatinine, Ser: 2.64 mg/dL — ABNORMAL HIGH (ref 0.61–1.24)
GFR calc Af Amer: 35 mL/min — ABNORMAL LOW (ref >60)
GFR calc non Af Amer: 30 mL/min — ABNORMAL LOW (ref >60)
Glucose, Bld: 128 mg/dL — ABNORMAL HIGH (ref 70–99)
Potassium: 4 mmol/L (ref 3.5–5.1)
Sodium: 136 mmol/L (ref 135–145)
Total Bilirubin: 4 mg/dL — ABNORMAL HIGH (ref 0.3–1.2)
Total Protein: 6.9 g/dL (ref 6.5–8.1)

## 2019-03-21 LAB — RAPID URINE DRUG SCREEN, HOSP PERFORMED
Amphetamines: NOT DETECTED
Barbiturates: NOT DETECTED
Benzodiazepines: NOT DETECTED
Cocaine: POSITIVE — AB
Opiates: POSITIVE — AB
Tetrahydrocannabinol: POSITIVE — AB

## 2019-03-21 LAB — RETICULOCYTES
Immature Retic Fract: 26.6 % — ABNORMAL HIGH (ref 2.3–15.9)
RBC.: 1.61 MIL/uL — ABNORMAL LOW (ref 4.22–5.81)
Retic Count, Absolute: 209.5 10*3/uL — ABNORMAL HIGH (ref 19.0–186.0)
Retic Ct Pct: 13 % — ABNORMAL HIGH (ref 0.4–3.1)

## 2019-03-21 LAB — RESPIRATORY PANEL BY RT PCR (FLU A&B, COVID)
Influenza A by PCR: NEGATIVE
Influenza B by PCR: NEGATIVE
SARS Coronavirus 2 by RT PCR: NEGATIVE

## 2019-03-21 LAB — URINALYSIS, COMPLETE (UACMP) WITH MICROSCOPIC
Bacteria, UA: NONE SEEN
Bilirubin Urine: NEGATIVE
Glucose, UA: 150 mg/dL — AB
Ketones, ur: NEGATIVE mg/dL
Leukocytes,Ua: NEGATIVE
Nitrite: NEGATIVE
Protein, ur: >=300 mg/dL — AB
Specific Gravity, Urine: 1.009 (ref 1.005–1.030)
pH: 8 (ref 5.0–8.0)

## 2019-03-21 LAB — PREPARE RBC (CROSSMATCH)

## 2019-03-21 MED ORDER — POLYETHYLENE GLYCOL 3350 17 G PO PACK: 17.0000 g | PACK | Freq: Every day | ORAL | Status: DC | PRN

## 2019-03-21 MED ORDER — HYDROMORPHONE HCL 2 MG/ML IJ SOLN
2.0000 mg | INTRAMUSCULAR | Status: AC
  Administered 2019-03-21: 10:00:00 2 mg via INTRAVENOUS
  Filled 2019-03-21: qty 1

## 2019-03-21 MED ORDER — SENNOSIDES-DOCUSATE SODIUM 8.6-50 MG PO TABS: 1.0000 | ORAL_TABLET | Freq: Two times a day (BID) | ORAL | Status: DC

## 2019-03-21 MED ORDER — DIPHENHYDRAMINE HCL 25 MG PO CAPS
25.0000 mg | ORAL_CAPSULE | ORAL | Status: DC | PRN
  Administered 2019-03-21: 25 mg via ORAL
  Filled 2019-03-21 (×2): qty 1

## 2019-03-21 MED ORDER — ENOXAPARIN SODIUM 30 MG/0.3ML ~~LOC~~ SOLN
30.0000 mg | SUBCUTANEOUS | Status: DC
  Filled 2019-03-21: qty 0.3

## 2019-03-21 MED ORDER — SODIUM CHLORIDE 0.9% FLUSH: 9.0000 mL | INTRAVENOUS | Status: DC | PRN

## 2019-03-21 MED ORDER — HYDROMORPHONE HCL 2 MG/ML IJ SOLN
2.0000 mg | INTRAMUSCULAR | Status: AC
  Administered 2019-03-21: 08:00:00 2 mg via INTRAVENOUS
  Filled 2019-03-21: qty 1

## 2019-03-21 MED ORDER — HYDROMORPHONE 1 MG/ML IV SOLN
INTRAVENOUS | Status: DC
  Administered 2019-03-21: 3 mg via INTRAVENOUS
  Administered 2019-03-22: 2 mg via INTRAVENOUS
  Administered 2019-03-22: 2.5 mg via INTRAVENOUS
  Administered 2019-03-22: 5.5 mg via INTRAVENOUS
  Administered 2019-03-22: 3.5 mg via INTRAVENOUS
  Filled 2019-03-21: qty 30

## 2019-03-21 MED ORDER — DIPHENHYDRAMINE HCL 25 MG PO CAPS
25.0000 mg | ORAL_CAPSULE | ORAL | Status: DC | PRN
  Administered 2019-03-21: 25 mg via ORAL
  Filled 2019-03-21: qty 1

## 2019-03-21 MED ORDER — OXYCODONE HCL ER 15 MG PO T12A
15.0000 mg | EXTENDED_RELEASE_TABLET | Freq: Two times a day (BID) | ORAL | Status: DC
  Administered 2019-03-21 – 2019-03-22 (×3): 15 mg via ORAL
  Filled 2019-03-21 (×3): qty 1

## 2019-03-21 MED ORDER — HYDROMORPHONE HCL 1 MG/ML IJ SOLN
2.0000 mg | Freq: Once | INTRAMUSCULAR | Status: AC
  Administered 2019-03-21: 16:00:00 2 mg via SUBCUTANEOUS
  Filled 2019-03-21: qty 2

## 2019-03-21 MED ORDER — FOLIC ACID 1 MG PO TABS
1.0000 mg | ORAL_TABLET | Freq: Every day | ORAL | Status: DC
  Administered 2019-03-22: 09:00:00 1 mg via ORAL
  Filled 2019-03-21 (×2): qty 1

## 2019-03-21 MED ORDER — SODIUM BICARBONATE 650 MG PO TABS
650.0000 mg | ORAL_TABLET | Freq: Two times a day (BID) | ORAL | Status: DC
  Administered 2019-03-21 – 2019-03-22 (×2): 650 mg via ORAL
  Filled 2019-03-21 (×3): qty 1

## 2019-03-21 MED ORDER — ALLOPURINOL 100 MG PO TABS
100.0000 mg | ORAL_TABLET | Freq: Every day | ORAL | Status: DC
  Administered 2019-03-22: 100 mg via ORAL
  Filled 2019-03-21 (×3): qty 1

## 2019-03-21 MED ORDER — DEXTROSE-NACL 5-0.45 % IV SOLN: INTRAVENOUS | Status: DC

## 2019-03-21 MED ORDER — PREDNISONE 1 MG PO TABS: 0.5000 mg | ORAL_TABLET | Freq: Every day | ORAL | Status: DC

## 2019-03-21 MED ORDER — NALOXONE HCL 0.4 MG/ML IJ SOLN: 0.4000 mg | INTRAMUSCULAR | Status: DC | PRN

## 2019-03-21 MED ORDER — HYDROMORPHONE HCL 2 MG/ML IJ SOLN
2.0000 mg | INTRAMUSCULAR | Status: AC
  Administered 2019-03-21: 09:00:00 2 mg via INTRAVENOUS
  Filled 2019-03-21: qty 1

## 2019-03-21 MED ORDER — NORTRIPTYLINE HCL 25 MG PO CAPS
25.0000 mg | ORAL_CAPSULE | Freq: Every evening | ORAL | Status: DC | PRN
  Filled 2019-03-21: qty 1

## 2019-03-21 NOTE — Progress Notes (Signed)
Pt refusing to use Incentive spirometer, cough and deep breath. Education provided, pt stated that these things will not help me and I don't want to use it. Encouraged multiple times, pt still refusing.

## 2019-03-21 NOTE — ED Triage Notes (Signed)
Pt presents with c/o sickle cell pain. Pt reports the pain is in his legs and arms. Pt reports he also has arthritis and it is flaring up in conjunction with his sickle cell.

## 2019-03-21 NOTE — Progress Notes (Signed)
Lab was called in and requested to add Rapid urine drug screen along with UA & CS. Specimen was collected and send to the lab earlier.

## 2019-03-21 NOTE — Progress Notes (Addendum)
Pt requesting IV pain medication, pain level is 10/10, MD is paged.

## 2019-03-21 NOTE — H&P (Signed)
H&P  Patient Demographics:  [name] [name], is a 39 y.o. male  MRN: [id number]   DOB - Oct 27, 1980  Admit Date - 03/21/2019  Outpatient Primary MD for the patient is [name], MD  Chief Complaint  Patient presents with  . Sickle Cell Pain Crisis      HPI:   [name] [name]  is a 39 y.o. male with a medical history significant for sickle cell disease, chronic pain syndrome, opiate dependence and tolerance, history of polysubstance abuse, history of anemia of chronic disease, immune complex glomerulonephritis, chronic marijuana use, and tobacco dependence presented to the ER with complaints of pain to bilateral lower extremities, especially in left foot.  Patient also endorses weakness and dizziness with standing and walking short distances.  Patient attributes current pain crisis to changes in weather.  His pain intensity is 9/10 characterized as constant and throbbing.  Patient states that he was recently evaluated at [facility] and was given a course of prednisone for increased left foot pain.  Also, patient's uric acid was elevated and he was started on allopurinol at that time.  Patient denies any chest pain, shortness of breath, paresthesias, urinary symptoms, nausea, vomiting, or diarrhea.  ER course: Hemoglobin 5.4, decreased from patient's baseline.  Patient transfused 2 units of PRBCs starting in the emergency department.  WBCs 22.5.  Creatinine is 2.64, which is consistent with patient's baseline.  BP 114/76, pulse 106, oxygen saturation 97%.  Patient afebrile.  Pain persists despite IV Dilaudid, IV fluids admit to telemetry for management of sickle cell pain crisis and symptomatic anemia.  Review of systems:  In addition to the HPI above, patient reports Review of Systems  HENT: Negative.   Eyes: Negative.   Respiratory: Negative.   Cardiovascular: Negative for chest pain and leg swelling.  Gastrointestinal: Negative.   Genitourinary: Negative.   Musculoskeletal: Positive  for joint pain.  Skin: Negative.   Neurological: Positive for dizziness.  Psychiatric/Behavioral: Negative.      A full 10 point Review of Systems was done, except as stated above, all other Review of Systems were negative.  With Past History of the following :   Past Medical History:  Diagnosis Date  . Abscess of right iliac fossa 09/24/2018   required Perc Drain 09/24/2018  . Arachnoid Cyst of brain bilaterally    "2 really small ones in the back of my head; inside; saw them w/MRI" (09/25/2012)  . Bacterial pneumonia ~ 2012   "caught it here in the hospital" (09/25/2012)  . Chronic kidney disease    "from my sickle cell" (09/25/2012)  . CKD (chronic kidney disease), stage II   . Colitis 04/19/2017   CT scan abd/ pelvis  . GERD (gastroesophageal reflux disease)    "after I eat alot of spicey foods" (09/25/2012)  . Gynecomastia, male 07/10/2012  . History of blood transfusion    "always related to sickle cell crisis" (09/25/2012)  . 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   Noted in noted from Hematology notes at [facility]  . Migraines    "take RX qd to prevent them" (09/25/2012)  . Opioid dependence with withdrawal ([place]) 08/30/2012  . Renal insufficiency   . Sickle cell anemia (HCC)   . Sickle cell crisis ([place]) 09/25/2012  . Sickle cell nephropathy ([place]) 07/10/2012  . Sinus tachycardia   . Tachycardia with heart rate 121-140 beats per minute with ambulation 08/04/2016      Past Surgical History:  Procedure Laterality Date  . ABCESS DRAINAGE    .  CHOLECYSTECTOMY  ~ 2012  . COLONOSCOPY N/A 04/23/2017   Procedure: COLONOSCOPY;  Surgeon: Irene Shipper, MD;  Location: [facility];  Service: Endoscopy;  Laterality: N/A;  . IR FLUORO GUIDE CV LINE RIGHT  12/17/2016  . IR RADIOLOGIST EVAL & MGMT  10/02/2018  . IR RADIOLOGIST EVAL & MGMT  10/15/2018  . IR REMOVAL TUN CV CATH W/O FL  12/21/2016  . IR US GUIDE VASC ACCESS RIGHT  12/17/2016  . spleenectomy       Social History:    Social History   Tobacco Use  . Smoking status: Current Some Day Smoker    Packs/day: 0.10    Types: Cigarettes  . Smokeless tobacco: Never Used  . Tobacco comment: 09/25/2012 "I don't buy cigarettes; bum one from friends q now and then"  Substance Use Topics  . Alcohol use: Yes    Alcohol/week: 0.0 standard drinks    Comment: Once a month     Lives - At home   Family History :   Family History  Problem Relation Age of Onset  . Breast cancer Mother      Home Medications:   Prior to Admission medications   Medication Sig Start Date End Date Taking? Authorizing Provider  allopurinol (ZYLOPRIM) 100 MG tablet Take 100 mg by mouth daily. 03/18/19 03/17/20 Yes [provider]  cholecalciferol (VITAMIN D3) 25 MCG (1000 UT) tablet Take 2,000 Units by mouth daily.   Yes [provider]  folic acid (FOLVITE) 1 MG tablet Take 1 tablet (1 mg total) by mouth daily. 03/11/19  Yes [name], FNP  hydroxyurea (HYDREA) 500 MG capsule TAKE 3 CAPSULES BY MOUTH EVERY DAY Patient taking differently: Take 1,500 mg by mouth daily.  12/24/18  Yes [name], FNP  nortriptyline (PAMELOR) 25 MG capsule TAKE 1 CAPSULE (25 MG TOTAL) BY MOUTH AT BEDTIME AS NEEDED FOR SLEEP. 03/14/19  Yes Donnamae Jude, MD  oxyCODONE (OXYCONTIN) 15 mg 12 hr tablet Take 1 tablet (15 mg total) by mouth every 12 (twelve) hours. 03/11/19  Yes [name], FNP  Oxycodone HCl 10 MG TABS Take 1 tablet (10 mg total) by mouth every 6 (six) hours as needed. 03/11/19  Yes [name], FNP  predniSONE (DELTASONE) 10 MG tablet Take 0.5 mg by mouth daily. Take 3 tabs day 1 Take 2 tabs day 2 Take 1 tab day 3 Take 0.5 tab 03/18/19  Yes [provider]  sodium bicarbonate 650 MG tablet Take 650 mg by mouth 2 (two) times daily. 11/07/18  Yes [provider]     Allergies:   Allergies  Allergen Reactions  . Nsaids Other (See Comments)    Kidney disease  . Morphine And Related  Other (See Comments)    "Real bad headaches"      Physical Exam:   Vitals:   Vitals:   03/21/19 2002 03/22/19 0002  BP: 115/85 127/85  Pulse: 70 67  Resp: 14 16  Temp: (!) 97.3 F (36.3 C) 97.9 F (36.6 C)  SpO2: 98% 95%    Physical Exam: Constitutional: Patient appears well-developed and well-nourished. Not in obvious distress. HENT: Normocephalic, atraumatic, External right and left ear normal. Oropharynx is clear and moist.  Eyes: Conjunctivae and EOM are normal. PERRLA, no scleral icterus. Neck: Normal ROM. Neck supple. No JVD. No tracheal deviation. No thyromegaly. CVS: RRR, S1/S2 +, no murmurs, no gallops, no carotid bruit.  Pulmonary: Effort and breath sounds normal, no stridor, rhonchi,  wheezes, rales.  Abdominal: Soft. BS +, no distension, tenderness, rebound or guarding.  Musculoskeletal: Normal range of motion. No edema and no tenderness.  Lymphadenopathy: No lymphadenopathy noted, cervical, inguinal or axillary Neuro: Alert. Normal reflexes, muscle tone coordination. No cranial nerve deficit. Skin: Skin is warm and dry. No rash noted. Not diaphoretic. No erythema. No pallor. Psychiatric: Normal mood and affect. Behavior, judgment, thought content normal.   Data Review:   CBC Recent Labs  Lab 03/21/19 0811  WBC 22.5*  HGB 5.4*  HCT 15.7*  PLT 186  MCV 98.7  MCH 34.0  MCHC 34.4  RDW 31.1*  LYMPHSABS 7.5*  MONOABS 1.3*  EOSABS 0.0  BASOSABS 0.0   ------------------------------------------------------------------------------------------------------------------  Chemistries  Recent Labs  Lab 03/21/19 0811  NA 136  K 4.0  CL 104  CO2 22  GLUCOSE 128*  BUN 33*  CREATININE 2.64*  CALCIUM 8.8*  AST 54*  ALT 34  ALKPHOS 232*  BILITOT 4.0*   ------------------------------------------------------------------------------------------------------------------ estimated creatinine clearance is 31.1 mL/min (A) (by C-G formula based on SCr of 2.64  mg/dL (H)). ------------------------------------------------------------------------------------------------------------------ No results for input(s): TSH, T4TOTAL, T3FREE, THYROIDAB in the last 72 hours.  Invalid input(s): FREET3  Coagulation profile No results for input(s): INR, PROTIME in the last 168 hours. ------------------------------------------------------------------------------------------------------------------- No results for input(s): DDIMER in the last 72 hours. -------------------------------------------------------------------------------------------------------------------  Cardiac Enzymes No results for input(s): CKMB, TROPONINI, MYOGLOBIN in the last 168 hours.  Invalid input(s): CK ------------------------------------------------------------------------------------------------------------------ No results found for: BNP  ---------------------------------------------------------------------------------------------------------------  Urinalysis    Component Value Date/Time   COLORURINE YELLOW 03/21/2019 [place] 03/21/2019 1420   LABSPEC 1.009 03/21/2019 1420   PHURINE 8.0 03/21/2019 1420   GLUCOSEU 150 (A) 03/21/2019 1420   HGBUR LARGE (A) 03/21/2019 1420   HGBUR small 03/10/2010 1445   BILIRUBINUR NEGATIVE 03/21/2019 1420   BILIRUBINUR neg 03/11/2019 0904   KETONESUR NEGATIVE 03/21/2019 1420   PROTEINUR >=300 (A) 03/21/2019 1420   UROBILINOGEN 0.2 03/11/2019 0904   UROBILINOGEN 0.2 05/11/2017 1335   NITRITE NEGATIVE 03/21/2019 1420   LEUKOCYTESUR NEGATIVE 03/21/2019 1420    ----------------------------------------------------------------------------------------------------------------   Imaging Results:    No results found.    Assessment & Plan:  Principal Problem:   Sickle cell pain crisis (HCC) Active Problems:   TOBACCO ABUSE   Systolic murmur   Leukocytosis   Symptomatic anemia   Immune-complex glomerulonephritis    Pain in left foot  Sickle cell disease with pain crisis: Admit to telemetry.  Continue gentle hydration with 0.45% saline at 50 mL/h. IV Dilaudid via PCA with settings of 0.5 mg, 10-minute lockout, and 3 mg/h. Hold IV Toradol due to history of glomerulonephritis Continue to monitor closely, reevaluate vital signs regularly.  Supplemental oxygen as reviewed. Pain intensity will be reevaluated in context of functioning in relationship to baseline as patient's care progresses  Chronic pain syndrome: Continue OxyContin.  Hold oxycodone, use PCA Dilaudid as substitute.  Sickle cell anemia: Hemoglobin 5.4, decreased from patient's baseline 7.0-8.0 g/dL.  Transfuse 2 units of packed red blood cells.  Follow CBC in a.m. Continue folic acid and hold hydroxyurea due to decreased hemoglobin.  Consider restarting Hydrea if hemoglobin improved  History of polysubstance abuse: Urine drug screen pending.  Patient states that he is not used cocaine in greater than 1 month  Chronic kidney disease stage IV: Patient has a history of chronic kidney disease and is followed by outpatient nephrology.  Creatinine is 2.64 and GFR is 33.  These findings  are consistent with patient's baseline and have improved from previous.  Left foot pain: Patient has a history of left foot pain.  Previous MRI of left foot shows multifocal bone infarcts throughout the tarsal bones and degenerative changes. No further imaging warranted.  Continue pain management.  Leukocytosis: WBCs 22.5.  Patient is afebrile.  No signs of infection or inflammation.  Patient was recently started on prednisone which may be attributing to increased WBCs.  Continue to follow CBC   Tobacco dependence: Discussed the dangers of smoking at length.  Patient expressed understanding.  He is not ready to quit at this time.  Patient declines nicotine patches.  DVT Prophylaxis: Subcut Lovenox   AM Labs Ordered, also please review Full Orders  Family  Communication: Admission, patient's condition and plan of care including tests being ordered have been discussed with the patient who indicate understanding and agree with the plan and Code Status.  Code Status: Full Code  Consults called: None    Admission status: Inpatient    Time spent in minutes : 50 minutes  Rinard, MSN, FNP-C Patient [facility] [address] [place], [place] [address] [phone]  03/22/2019 at 12:17 AM
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
## 2019-03-21 NOTE — ED Notes (Signed)
Blood bank has blood ready on this patient, notified Lynnsey,RN.
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
## 2019-03-22 ENCOUNTER — Other Ambulatory Visit: Payer: Self-pay | Admitting: Internal Medicine

## 2019-03-22 DIAGNOSIS — D5700 Hb-SS disease with crisis, unspecified: Principal | ICD-10-CM

## 2019-03-22 LAB — TYPE AND SCREEN
ABO/RH(D): O POS
Antibody Screen: NEGATIVE
Donor AG Type: NEGATIVE
Donor AG Type: NEGATIVE
Unit division: 0
Unit division: 0

## 2019-03-22 LAB — BASIC METABOLIC PANEL
Anion gap: 14 (ref 5–15)
BUN: 27 mg/dL — ABNORMAL HIGH (ref 6–20)
CO2: 18 mmol/L — ABNORMAL LOW (ref 22–32)
Calcium: 8.4 mg/dL — ABNORMAL LOW (ref 8.9–10.3)
Chloride: 105 mmol/L (ref 98–111)
Creatinine, Ser: 2.35 mg/dL — ABNORMAL HIGH (ref 0.61–1.24)
GFR calc Af Amer: 40 mL/min — ABNORMAL LOW (ref >60)
GFR calc non Af Amer: 34 mL/min — ABNORMAL LOW (ref >60)
Glucose, Bld: 90 mg/dL (ref 70–99)
Potassium: 4.5 mmol/L (ref 3.5–5.1)
Sodium: 137 mmol/L (ref 135–145)

## 2019-03-22 LAB — CBC
HCT: 25.3 % — ABNORMAL LOW (ref 39.0–52.0)
Hemoglobin: 8.6 g/dL — ABNORMAL LOW (ref 13.0–17.0)
MCH: 29.3 pg (ref 26.0–34.0)
MCHC: 34 g/dL (ref 30.0–36.0)
MCV: 86.1 fL (ref 80.0–100.0)
Platelets: 184 10*3/uL (ref 150–400)
RBC: 2.94 MIL/uL — ABNORMAL LOW (ref 4.22–5.81)
RDW: 26.4 % — ABNORMAL HIGH (ref 11.5–15.5)
WBC: 16.7 10*3/uL — ABNORMAL HIGH (ref 4.0–10.5)
nRBC: 11.6 % — ABNORMAL HIGH (ref 0.0–0.2)

## 2019-03-22 LAB — BPAM RBC
Blood Product Expiration Date: 202103092359
Blood Product Expiration Date: 202103102359
ISSUE DATE / TIME: 202102051040
ISSUE DATE / TIME: 202102051407
Unit Type and Rh: 5100
Unit Type and Rh: 5100

## 2019-03-22 LAB — URINE CULTURE: Culture: NO GROWTH

## 2019-03-22 NOTE — Progress Notes (Signed)
This nurse called Dr. Doreene Burke to confirm that patient's PCA had been d/c.  Dr. Doreene Burke confirmed that because patient was positive for cocaine, that the Dilaudid PCA had to be stopped. He also stated he was coming to see patient. Patient made aware. PCA stopped and removed from room.  Patient became very upset at the time.  MD notified RN to take him AMA paperwork.  Patient refused to sign.   IV removed.  2 RNs signed [place] paperwork.  Patient left hospital, escorted to the exit.  Virginia Rochester, RN

## 2019-03-26 ENCOUNTER — Telehealth: Payer: Self-pay | Admitting: Family Medicine

## 2019-03-26 ENCOUNTER — Other Ambulatory Visit: Payer: Self-pay | Admitting: Family Medicine

## 2019-03-26 DIAGNOSIS — G894 Chronic pain syndrome: Secondary | ICD-10-CM

## 2019-03-26 MED ORDER — GABAPENTIN 300 MG PO CAPS: 300.0000 mg | ORAL_CAPSULE | Freq: Two times a day (BID) | ORAL | 1 refills | Status: DC | PRN

## 2019-03-26 MED ORDER — ACETAMINOPHEN 500 MG PO TABS: 500.0000 mg | ORAL_TABLET | Freq: Four times a day (QID) | ORAL | 0 refills | Status: DC | PRN

## 2019-03-26 NOTE — Progress Notes (Unsigned)
Arshan Villareal, a 39 year old patient with a history of sickle cell disease, chronic pain syndrome, opiate dependence and tolerance, history of anemia of chronic disease, and cocaine use has requested opiate medication prescriptions. Urine drug screen was positive for both cocaine and marijuana on 03/21/2019. This provider will no longer be able to prescribe opiate medications for this patient. I have sent the following medications and referral for this patient:   Meds ordered this encounter  Medications  . gabapentin (NEURONTIN) 300 MG capsule    Sig: Take 1 capsule (300 mg total) by mouth 2 (two) times daily as needed.    Dispense:  60 capsule    Refill:  1    Order Specific Question:   Supervising Provider    Answer:   [name] [id number]  . acetaminophen (TYLENOL) 500 MG tablet    Sig: Take 1 tablet (500 mg total) by mouth every 6 (six) hours as needed.    Dispense:  30 tablet    Refill:  0    Order Specific Question:   Supervising Provider    Answer:   [name] [id number]   Orders Placed This Encounter  Procedures  . Ambulatory referral to Pain Clinic    Referral Priority:   Routine    Referral Type:   Consultation    Referral Reason:   Specialty Services Required    Requested Specialty:   Pain Medicine    Number of Visits Requested:   [place], MSN, FNP-C Patient [facility] [address] [place], [place] [address] [phone]

## 2019-03-26 NOTE — Progress Notes (Signed)
I called and spoke with patient. I advised we would not be prescribing opiate medications due to last drug screen showed positive for cocaine and marijuana. I advised him gabapentin and tylenol has been sent to pharmacy and we will refer him to pain management. He says there is no way that he had cocaine in his system and he is requesting a call back from provider. He said he does not want to be sent to pain management. Can you please call him when you get a chance?

## 2019-03-26 NOTE — Telephone Encounter (Signed)
Returned patient's phone call, no answer. Left message.   [name]  APRN, MSN, FNP-C Patient [name] [address] [name], [name] [address] [phone]

## 2019-03-27 NOTE — Discharge Summary (Signed)
Physician Discharge Summary  [name] [name]. ID:[id number] DOB: [birth year] DOA: 03/21/2019  PCP: [name], MD  Admit date: 03/21/2019  Discharge date: Left AMA on 03/22/2019  Discharge Diagnoses:  Principal Problem:   Sickle cell pain crisis ([place]) Active Problems:   TOBACCO ABUSE   Systolic murmur   Leukocytosis   Symptomatic anemia   Immune-complex glomerulonephritis   Pain in left foot  Discharge Condition: Stable  Disposition:  Follow-up [facility] Follow up.   Specialty: Internal Medicine Contact information: [place] [phone]         Diet: Regular Wt Readings from Last 3 Encounters:  03/21/19 59 kg  03/11/19 59.9 kg  03/05/19 58.1 kg   History of present illness:  [name] [name]  is a 39 y.o. male with a medical history significant for sickle cell disease, chronic pain syndrome, opiate dependence and tolerance, history of polysubstance abuse, history of anemia of chronic disease, immune complex glomerulonephritis, chronic marijuana use, and tobacco dependence presented to the ER with complaints of pain to bilateral lower extremities, especially in left foot.  Patient also endorses weakness and dizziness with standing and walking short distances.  Patient attributes current pain crisis to changes in weather.  His pain intensity is 9/10 characterized as constant and throbbing.  Patient states that he was recently evaluated at [facility] and was given a course of prednisone for increased left foot pain.  Also, patient's uric acid was elevated and he was started on allopurinol at that time.  Patient denies any chest pain, shortness of breath, paresthesias, urinary symptoms, nausea, vomiting, or diarrhea.  ER course: Hemoglobin 5.4, decreased from patient's baseline.  Patient transfused 2 units of PRBCs starting in the emergency department.  WBCs 22.5.  Creatinine is 2.64, which  is consistent with patient's baseline.  BP 114/76, pulse 106, oxygen saturation 97%.  Patient afebrile.  Pain persists despite IV Dilaudid, IV fluids admit to telemetry for management of sickle cell pain crisis and symptomatic anemia.  Hospital Course:  Patient was admitted for sickle cell pain crisis and managed appropriately initially with IVF, IV Dilaudid via PCA as well as other adjunct therapies per sickle cell pain management protocols. IV Toradol is contraindicated due to CKD. He was transfused with 2 units of PRBC for a low Hb of 5.4 on admission. Unfortunately, patient's urine drug screen was positive again for cocaine and per policy, his IV Dilaudid was discontinued. He was however continued on his regular po opiates for pain. Patient decided to leave the hospital against medical advice because his dilaudid was discontinued.   Discharge Exam: Vitals:   03/22/19 1217 03/22/19 1237  BP:  129/85  Pulse:  75  Resp: 11   Temp:  97.6 F (36.4 C)  SpO2: 99% 96%   Vitals:   03/22/19 0758 03/22/19 0839 03/22/19 1217 03/22/19 1237  BP: (!) 122/91   129/85  Pulse: 86   75  Resp: 12 13 11    Temp: (!) 97.5 F (36.4 C)   97.6 F (36.4 C)  TempSrc: Oral   Oral  SpO2: 96% 97% 99% 96%  Weight:      Height:       Discharge Instructions  Left AMA  Allergies as of 03/22/2019      Reactions   Nsaids Other (See Comments)   Kidney disease   Morphine And Related Other (See Comments)   "Real bad headaches"  Medication List    ASK your doctor about these medications   allopurinol 100 MG tablet Commonly known as: ZYLOPRIM Take 100 mg by mouth daily.   cholecalciferol 25 MCG (1000 UNIT) tablet Commonly known as: VITAMIN D3 Take 2,000 Units by mouth daily.   folic acid 1 MG tablet Commonly known as: FOLVITE Take 1 tablet (1 mg total) by mouth daily.   hydroxyurea 500 MG capsule Commonly known as: HYDREA TAKE 3 CAPSULES BY MOUTH EVERY DAY   nortriptyline 25 MG  capsule Commonly known as: PAMELOR TAKE 1 CAPSULE (25 MG TOTAL) BY MOUTH AT BEDTIME AS NEEDED FOR SLEEP.   oxyCODONE 15 mg 12 hr tablet Commonly known as: OxyCONTIN Take 1 tablet (15 mg total) by mouth every 12 (twelve) hours.   Oxycodone HCl 10 MG Tabs Take 1 tablet (10 mg total) by mouth every 6 (six) hours as needed.   predniSONE 10 MG tablet Commonly known as: DELTASONE Take 0.5 mg by mouth daily. Take 3 tabs day 1 Take 2 tabs day 2 Take 1 tab day 3 Take 0.5 tab   sodium bicarbonate 650 MG tablet Take 650 mg by mouth 2 (two) times daily.       The results of significant diagnostics from this hospitalization (including imaging, microbiology, ancillary and laboratory) are listed below for reference.    Significant Diagnostic Studies: No results found.  Microbiology: Recent Results (from the past 240 hour(s))  Respiratory Panel by RT PCR (Flu A&B, Covid) - Nasopharyngeal Swab     Status: None   Collection Time: 03/21/19  8:57 AM   Specimen: Nasopharyngeal Swab  Result Value Ref Range Status   SARS Coronavirus 2 by RT PCR NEGATIVE NEGATIVE Final    Comment: (NOTE) SARS-CoV-2 target nucleic acids are NOT DETECTED. The SARS-CoV-2 RNA is generally detectable in upper respiratoy specimens during the acute phase of infection. The lowest concentration of SARS-CoV-2 viral copies this assay can detect is 131 copies/mL. A negative result does not preclude SARS-Cov-2 infection and should not be used as the sole basis for treatment or other patient management decisions. A negative result may occur with  improper specimen collection/handling, submission of specimen other than nasopharyngeal swab, presence of viral mutation(s) within the areas targeted by this assay, and inadequate number of viral copies (<131 copies/mL). A negative result must be combined with clinical observations, patient history, and epidemiological information. The expected result is Negative. Fact Sheet for  Patients:  PinkCheek.be Fact Sheet for Healthcare Providers:  GravelBags.it This test is not yet ap proved or cleared by the [place] FDA and  has been authorized for detection and/or diagnosis of SARS-CoV-2 by FDA under an Emergency Use Authorization (EUA). This EUA will remain  in effect (meaning this test can be used) for the duration of the COVID-19 declaration under Section 564(b)(1) of the Act, 21 U.S.C. section 360bbb-3(b)(1), unless the authorization is terminated or revoked sooner.    Influenza A by PCR NEGATIVE NEGATIVE Final   Influenza B by PCR NEGATIVE NEGATIVE Final    Comment: (NOTE) The Xpert Xpress SARS-CoV-2/FLU/RSV assay is intended as an aid in  the diagnosis of influenza from Nasopharyngeal swab specimens and  should not be used as a sole basis for treatment. Nasal washings and  aspirates are unacceptable for Xpert Xpress SARS-CoV-2/FLU/RSV  testing. Fact Sheet for Patients: PinkCheek.be Fact Sheet for Healthcare Providers: GravelBags.it This test is not yet approved or cleared by the [place] FDA and  has been authorized for detection  and/or diagnosis of SARS-CoV-2 by  FDA under an Emergency Use Authorization (EUA). This EUA will remain  in effect (meaning this test can be used) for the duration of the  Covid-19 declaration under Section 564(b)(1) of the Act, 21  U.S.C. section 360bbb-3(b)(1), unless the authorization is  terminated or revoked. Performed at [facility], [place] [address] [place], [place] [address]   Urine culture     Status: None   Collection Time: 03/21/19  2:20 PM   Specimen: Urine, Clean Catch  Result Value Ref Range Status   Specimen Description   Final    URINE, CLEAN CATCH Performed at [facility], [place] [address] [place], [place] [address]    Special Requests   Final     NONE Performed at [facility], [place] [address] [place], [place] [address]    Culture   Final    NO GROWTH Performed at [facility], [place] [address], [place], [place] [address]    Report Status 03/22/2019 FINAL  Final     Labs: Basic Metabolic Panel: Recent Labs  Lab 03/21/19 0811 03/22/19 0556  NA 136 137  K 4.0 4.5  CL 104 105  CO2 22 18*  GLUCOSE 128* 90  BUN 33* 27*  CREATININE 2.64* 2.35*  CALCIUM 8.8* 8.4*   Liver Function Tests: Recent Labs  Lab 03/21/19 0811  AST 54*  ALT 34  ALKPHOS 232*  BILITOT 4.0*  PROT 6.9  ALBUMIN 3.2*   No results for input(s): LIPASE, AMYLASE in the last 168 hours. No results for input(s): AMMONIA in the last 168 hours. CBC: Recent Labs  Lab 03/21/19 0811 03/22/19 0556  WBC 22.5* 16.7*  NEUTROABS 13.5*  --   HGB 5.4* 8.6*  HCT 15.7* 25.3*  MCV 98.7 86.1  PLT 186 184   Cardiac Enzymes: No results for input(s): CKTOTAL, CKMB, CKMBINDEX, TROPONINI in the last 168 hours. BNP: Invalid input(s): POCBNP CBG: No results for input(s): GLUCAP in the last 168 hours.  Time coordinating discharge: 50 minutes  Signed:  [facility] 03/27/2019, 3:15 PM

## 2019-03-27 NOTE — Telephone Encounter (Signed)
Sent to NP 

## 2019-03-30 IMAGING — US US RENAL
1 series · 14 of 25 positions shown · non-contrast
Comparison: 03/24/2014

CLINICAL DATA: Acute renal failure

EXAM:
RENAL / URINARY TRACT ULTRASOUND COMPLETE

[Series 1: us renal · 0.26mm/px · 14 of 31 slices shown]
[im 1/31]
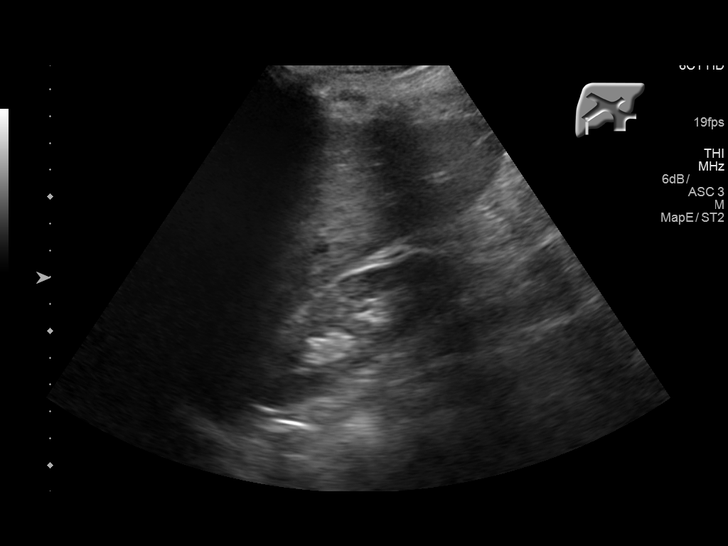
[im 3/31]
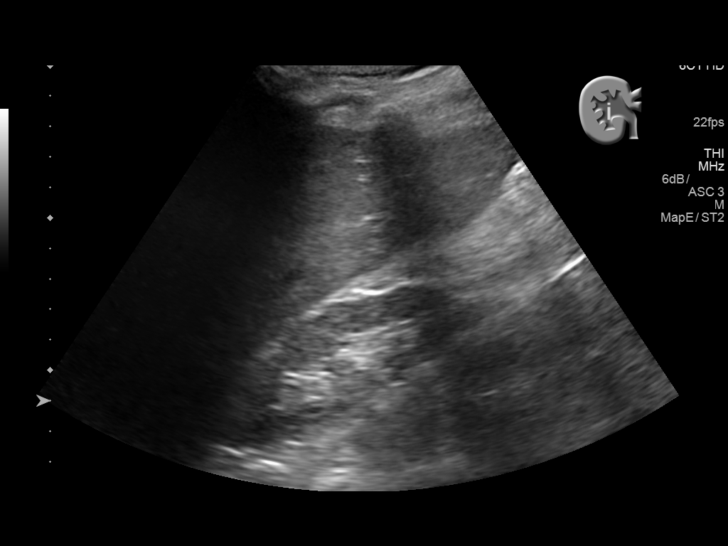
[im 6/31]
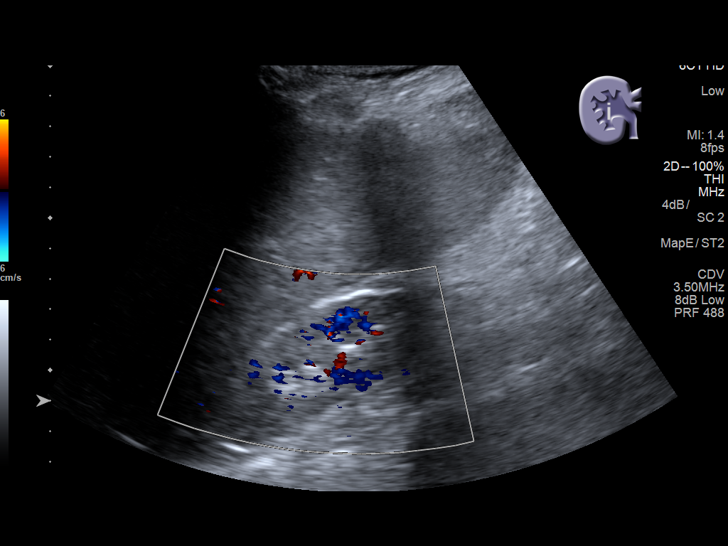
[im 8/31]
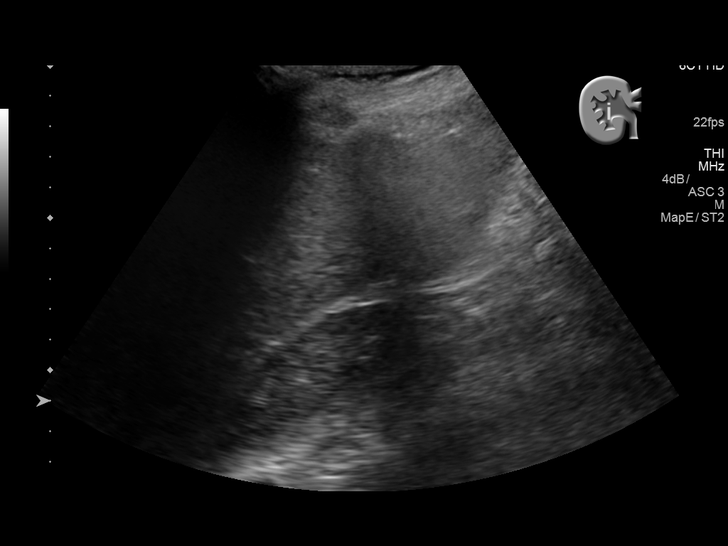
[im 11/31]
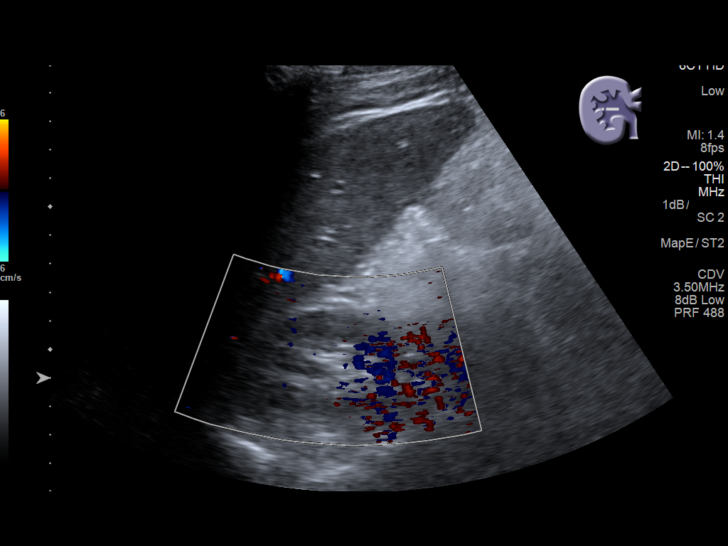
[im 12/31]
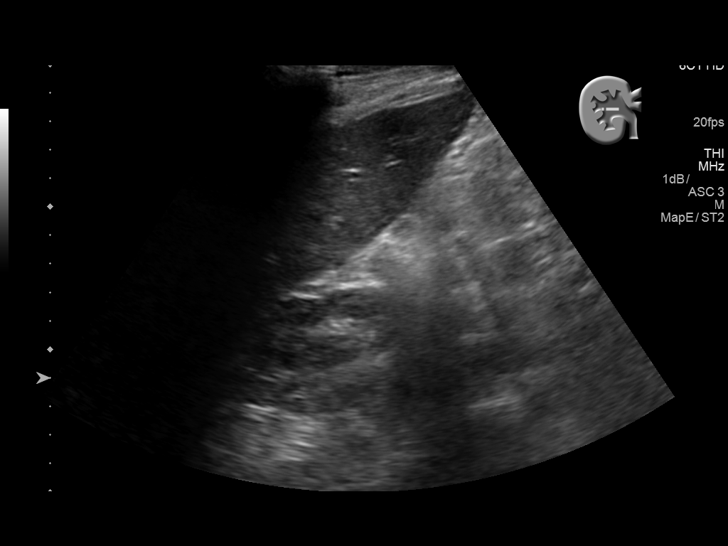
[im 14/31]
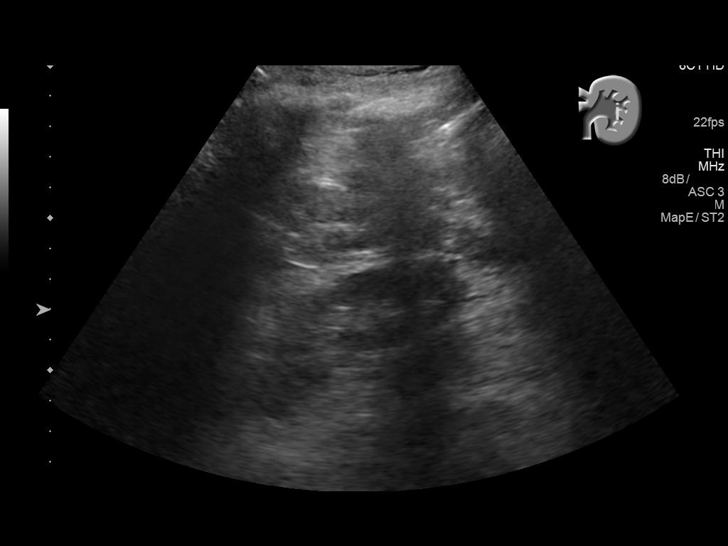
[im 17/31]
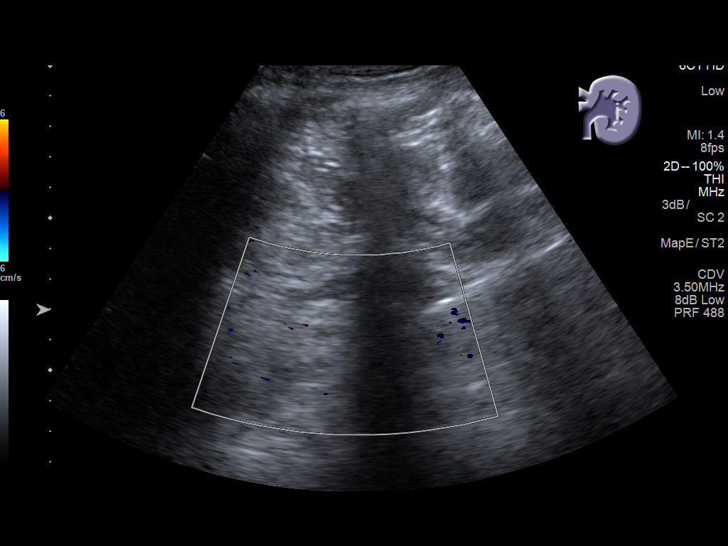
[im 19/31]
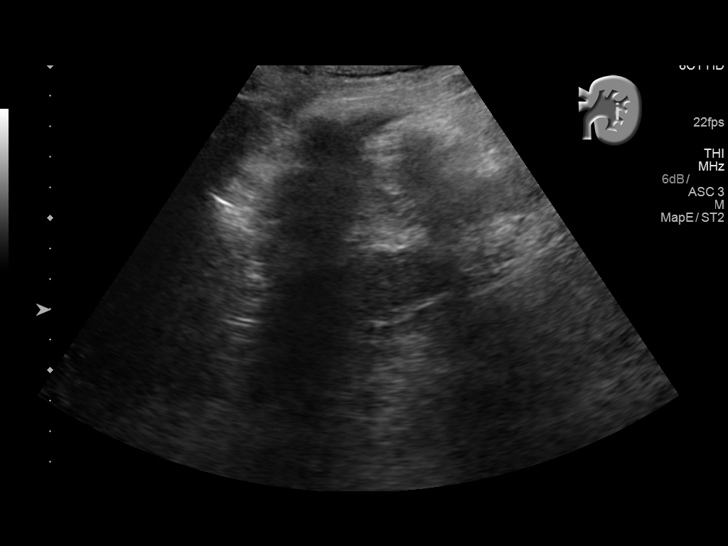
[im 21/31]
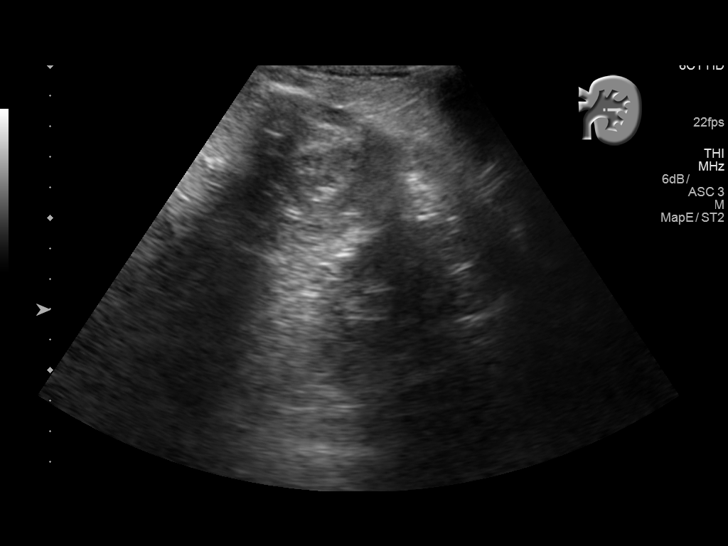
[im 23/31]
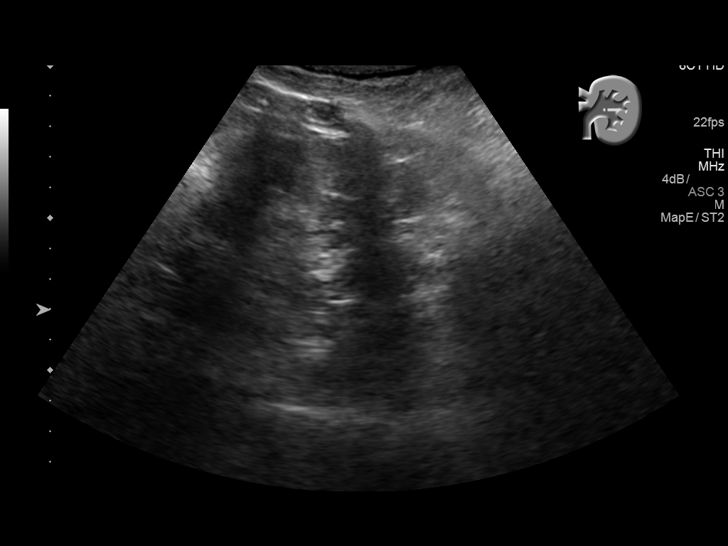
[im 26/31]
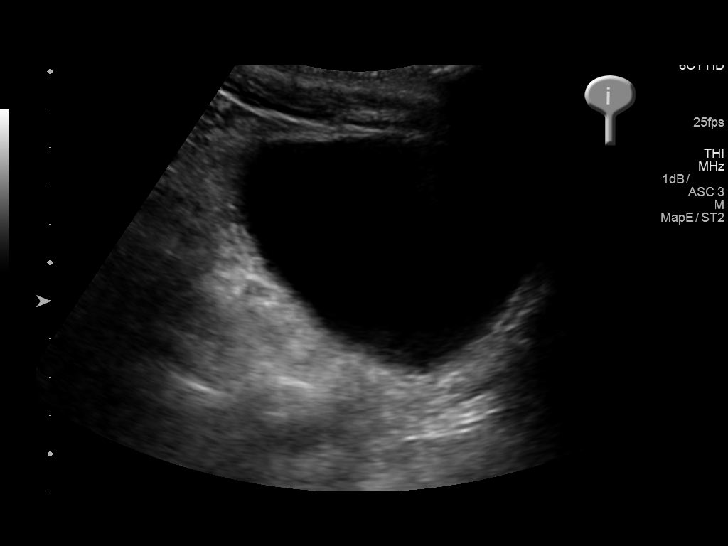
[im 28/31]
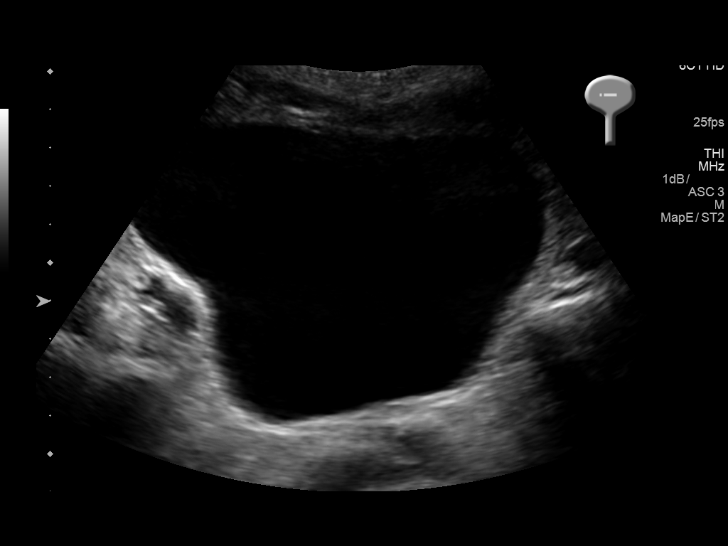
[im 31/31]
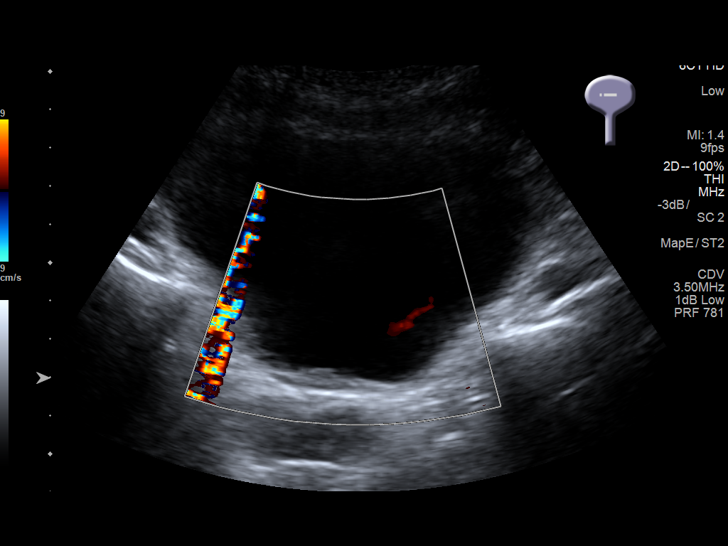

[14 of 25 positions shown; findings below may reference images not displayed]

FINDINGS: Right Kidney:

Length: 9.2 cm. Isoechoic compared to the adjacent liver. No focal
lesion. No hydronephrosis.

Left Kidney:

Length: 8.6 cm. Mild diffuse atrophy. No focal lesion or
hydronephrosis.

Bladder:

Appears normal for degree of bladder distention. A right ureteral
jet was demonstrated.
IMPRESSION: 1. No hydronephrosis.
2. Small bilateral kidneys without focal lesion.

## 2019-04-02 DIAGNOSIS — M8589 Other specified disorders of bone density and structure, multiple sites: Secondary | ICD-10-CM | POA: Diagnosis not present

## 2019-04-02 DIAGNOSIS — M79605 Pain in left leg: Secondary | ICD-10-CM | POA: Diagnosis not present

## 2019-04-02 DIAGNOSIS — M79604 Pain in right leg: Secondary | ICD-10-CM | POA: Diagnosis not present

## 2019-04-02 DIAGNOSIS — D5700 Hb-SS disease with crisis, unspecified: Secondary | ICD-10-CM | POA: Diagnosis not present

## 2019-04-03 DIAGNOSIS — D57219 Sickle-cell/Hb-C disease with crisis, unspecified: Secondary | ICD-10-CM | POA: Diagnosis not present

## 2019-04-03 DIAGNOSIS — M79604 Pain in right leg: Secondary | ICD-10-CM | POA: Diagnosis not present

## 2019-04-03 DIAGNOSIS — M79605 Pain in left leg: Secondary | ICD-10-CM | POA: Diagnosis not present

## 2019-04-04 DIAGNOSIS — D5700 Hb-SS disease with crisis, unspecified: Secondary | ICD-10-CM | POA: Diagnosis not present

## 2019-04-04 DIAGNOSIS — G894 Chronic pain syndrome: Secondary | ICD-10-CM | POA: Diagnosis not present

## 2019-04-04 DIAGNOSIS — N183 Chronic kidney disease, stage 3 unspecified: Secondary | ICD-10-CM | POA: Diagnosis not present

## 2019-04-04 DIAGNOSIS — M79604 Pain in right leg: Secondary | ICD-10-CM | POA: Diagnosis not present

## 2019-04-04 DIAGNOSIS — M79605 Pain in left leg: Secondary | ICD-10-CM | POA: Diagnosis not present

## 2019-04-04 DIAGNOSIS — D571 Sickle-cell disease without crisis: Secondary | ICD-10-CM | POA: Diagnosis not present

## 2019-04-04 DIAGNOSIS — N08 Glomerular disorders in diseases classified elsewhere: Secondary | ICD-10-CM | POA: Diagnosis not present

## 2019-04-05 DIAGNOSIS — D5700 Hb-SS disease with crisis, unspecified: Secondary | ICD-10-CM | POA: Diagnosis not present

## 2019-04-05 DIAGNOSIS — N08 Glomerular disorders in diseases classified elsewhere: Secondary | ICD-10-CM | POA: Diagnosis not present

## 2019-04-05 DIAGNOSIS — D571 Sickle-cell disease without crisis: Secondary | ICD-10-CM | POA: Diagnosis not present

## 2019-04-05 DIAGNOSIS — G894 Chronic pain syndrome: Secondary | ICD-10-CM | POA: Diagnosis not present

## 2019-04-05 DIAGNOSIS — N183 Chronic kidney disease, stage 3 unspecified: Secondary | ICD-10-CM | POA: Diagnosis not present

## 2019-04-05 DIAGNOSIS — M79604 Pain in right leg: Secondary | ICD-10-CM | POA: Diagnosis not present

## 2019-04-05 DIAGNOSIS — M79605 Pain in left leg: Secondary | ICD-10-CM | POA: Diagnosis not present

## 2019-04-06 DIAGNOSIS — D571 Sickle-cell disease without crisis: Secondary | ICD-10-CM | POA: Diagnosis not present

## 2019-04-06 DIAGNOSIS — G894 Chronic pain syndrome: Secondary | ICD-10-CM | POA: Diagnosis not present

## 2019-04-06 DIAGNOSIS — N183 Chronic kidney disease, stage 3 unspecified: Secondary | ICD-10-CM | POA: Diagnosis not present

## 2019-04-06 DIAGNOSIS — M79604 Pain in right leg: Secondary | ICD-10-CM | POA: Diagnosis not present

## 2019-04-06 DIAGNOSIS — N08 Glomerular disorders in diseases classified elsewhere: Secondary | ICD-10-CM | POA: Diagnosis not present

## 2019-04-06 DIAGNOSIS — D5700 Hb-SS disease with crisis, unspecified: Secondary | ICD-10-CM | POA: Diagnosis not present

## 2019-04-06 DIAGNOSIS — M79605 Pain in left leg: Secondary | ICD-10-CM | POA: Diagnosis not present

## 2019-04-07 DIAGNOSIS — M79604 Pain in right leg: Secondary | ICD-10-CM | POA: Diagnosis not present

## 2019-04-07 DIAGNOSIS — G894 Chronic pain syndrome: Secondary | ICD-10-CM | POA: Diagnosis not present

## 2019-04-07 DIAGNOSIS — N183 Chronic kidney disease, stage 3 unspecified: Secondary | ICD-10-CM | POA: Diagnosis not present

## 2019-04-07 DIAGNOSIS — M79605 Pain in left leg: Secondary | ICD-10-CM | POA: Diagnosis not present

## 2019-04-07 DIAGNOSIS — N08 Glomerular disorders in diseases classified elsewhere: Secondary | ICD-10-CM | POA: Diagnosis not present

## 2019-04-07 DIAGNOSIS — D571 Sickle-cell disease without crisis: Secondary | ICD-10-CM | POA: Diagnosis not present

## 2019-04-07 DIAGNOSIS — D5700 Hb-SS disease with crisis, unspecified: Secondary | ICD-10-CM | POA: Diagnosis not present

## 2019-04-08 DIAGNOSIS — D5700 Hb-SS disease with crisis, unspecified: Secondary | ICD-10-CM | POA: Diagnosis not present

## 2019-04-08 DIAGNOSIS — N183 Chronic kidney disease, stage 3 unspecified: Secondary | ICD-10-CM | POA: Diagnosis not present

## 2019-04-08 DIAGNOSIS — G894 Chronic pain syndrome: Secondary | ICD-10-CM | POA: Diagnosis not present

## 2019-04-08 DIAGNOSIS — M79605 Pain in left leg: Secondary | ICD-10-CM | POA: Diagnosis not present

## 2019-04-08 DIAGNOSIS — M79604 Pain in right leg: Secondary | ICD-10-CM | POA: Diagnosis not present

## 2019-04-08 DIAGNOSIS — D571 Sickle-cell disease without crisis: Secondary | ICD-10-CM | POA: Diagnosis not present

## 2019-04-08 DIAGNOSIS — N08 Glomerular disorders in diseases classified elsewhere: Secondary | ICD-10-CM | POA: Diagnosis not present

## 2019-04-08 MED ORDER — CHOLECALCIFEROL 25 MCG (1000 UT) PO TABS
2000.00 | ORAL_TABLET | ORAL | Status: DC
Start: 2019-04-08 — End: 2019-04-08

## 2019-04-08 MED ORDER — SODIUM CHLORIDE FLUSH 0.9 % IV SOLN
5.00 | INTRAVENOUS | Status: DC
Start: 2019-04-08 — End: 2019-04-08

## 2019-04-08 MED ORDER — NALOXONE HCL 0.4 MG/ML IJ SOLN
0.40 | INTRAMUSCULAR | Status: DC
Start: ? — End: 2019-04-08

## 2019-04-08 MED ORDER — ACETAMINOPHEN 325 MG PO TABS
650.00 | ORAL_TABLET | ORAL | Status: DC
Start: ? — End: 2019-04-08

## 2019-04-08 MED ORDER — LIDOCAINE HCL 1 % IJ SOLN
0.50 | INTRAMUSCULAR | Status: DC
Start: ? — End: 2019-04-08

## 2019-04-08 MED ORDER — HYDROXYUREA 500 MG PO CAPS
1500.00 | ORAL_CAPSULE | ORAL | Status: DC
Start: 2019-04-09 — End: 2019-04-08

## 2019-04-08 MED ORDER — SODIUM CHLORIDE 0.9 % IV SOLN
INTRAVENOUS | Status: DC
Start: ? — End: 2019-04-08

## 2019-04-08 MED ORDER — HYDROMORPHONE HCL-NACL 30-0.9 MG/30ML-% IJ SOSY
0.80 | PREFILLED_SYRINGE | INTRAMUSCULAR | Status: DC
Start: ? — End: 2019-04-08

## 2019-04-08 MED ORDER — ALLOPURINOL 100 MG PO TABS
100.00 | ORAL_TABLET | ORAL | Status: DC
Start: 2019-04-09 — End: 2019-04-08

## 2019-04-08 MED ORDER — ONDANSETRON HCL 4 MG/2ML IJ SOLN
4.00 | INTRAMUSCULAR | Status: DC
Start: ? — End: 2019-04-08

## 2019-04-08 MED ORDER — FOLIC ACID 1 MG PO TABS
1.00 | ORAL_TABLET | ORAL | Status: DC
Start: 2019-04-09 — End: 2019-04-08

## 2019-04-08 MED ORDER — GENERIC EXTERNAL MEDICATION
Status: DC
Start: ? — End: 2019-04-08

## 2019-04-08 MED ORDER — NORTRIPTYLINE HCL 25 MG PO CAPS
25.00 | ORAL_CAPSULE | ORAL | Status: DC
Start: 2019-04-08 — End: 2019-04-08

## 2019-04-08 MED ORDER — ENOXAPARIN SODIUM 30 MG/0.3ML ~~LOC~~ SOLN
30.00 | SUBCUTANEOUS | Status: DC
Start: 2019-04-08 — End: 2019-04-08

## 2019-04-08 MED ORDER — OXYCODONE HCL ER 20 MG PO T12A
20.00 | EXTENDED_RELEASE_TABLET | ORAL | Status: DC
Start: 2019-04-08 — End: 2019-04-08

## 2019-04-08 MED ORDER — CYANOCOBALAMIN 500 MCG PO TABS
2000.00 | ORAL_TABLET | ORAL | Status: DC
Start: 2019-04-09 — End: 2019-04-08

## 2019-04-08 MED ORDER — POLYETHYLENE GLYCOL 3350 17 GM/SCOOP PO POWD
17.00 | ORAL | Status: DC
Start: 2019-04-09 — End: 2019-04-08

## 2019-04-14 ENCOUNTER — Telehealth: Payer: Self-pay | Admitting: Family Medicine

## 2019-04-14 NOTE — Telephone Encounter (Signed)
Pt called and reminded of appointment 

## 2019-04-15 ENCOUNTER — Encounter: Payer: Self-pay | Admitting: Family Medicine

## 2019-04-15 ENCOUNTER — Ambulatory Visit (INDEPENDENT_AMBULATORY_CARE_PROVIDER_SITE_OTHER): Payer: Medicare Other | Admitting: Family Medicine

## 2019-04-15 ENCOUNTER — Other Ambulatory Visit: Payer: Self-pay

## 2019-04-15 VITALS — BP 113/69 | HR 95 | Temp 98.5°F | Resp 14 | Ht 63.0 in | Wt 122.0 lb

## 2019-04-15 DIAGNOSIS — Z8669 Personal history of other diseases of the nervous system and sense organs: Secondary | ICD-10-CM | POA: Diagnosis not present

## 2019-04-15 DIAGNOSIS — D571 Sickle-cell disease without crisis: Secondary | ICD-10-CM

## 2019-04-15 DIAGNOSIS — Z87898 Personal history of other specified conditions: Secondary | ICD-10-CM

## 2019-04-15 DIAGNOSIS — G894 Chronic pain syndrome: Secondary | ICD-10-CM

## 2019-04-15 DIAGNOSIS — F1491 Cocaine use, unspecified, in remission: Secondary | ICD-10-CM

## 2019-04-15 LAB — POCT URINALYSIS DIPSTICK
Bilirubin, UA: NEGATIVE
Blood, UA: NEGATIVE
Glucose, UA: NEGATIVE
Ketones, UA: NEGATIVE
Leukocytes, UA: NEGATIVE
Nitrite, UA: NEGATIVE
Protein, UA: POSITIVE — AB
Spec Grav, UA: 1.02 (ref 1.010–1.025)
Urobilinogen, UA: 0.2 E.U./dL
pH, UA: 7 (ref 5.0–8.0)

## 2019-04-15 MED ORDER — OXYCODONE HCL 10 MG PO TABS: 10.0000 mg | ORAL_TABLET | Freq: Four times a day (QID) | ORAL | 0 refills | Status: DC | PRN

## 2019-04-15 MED ORDER — NORTRIPTYLINE HCL 25 MG PO CAPS: 25.0000 mg | ORAL_CAPSULE | Freq: Two times a day (BID) | ORAL | 3 refills | Status: DC

## 2019-04-15 NOTE — Patient Instructions (Signed)
Sickle Cell Anemia, Adult  Sickle cell anemia is a condition where your red blood cells are shaped like sickles. Red blood cells carry oxygen through the body. Sickle-shaped cells do not live as long as normal red blood cells. They also clump together and block blood from flowing through the blood vessels. This prevents the body from getting enough oxygen. Sickle cell anemia causes organ damage and pain. It also increases the risk of infection. Follow these instructions at home: Medicines  Take over-the-counter and prescription medicines only as told by your doctor.  If you were prescribed an antibiotic medicine, take it as told by your doctor. Do not stop taking the antibiotic even if you start to feel better.  If you develop a fever, do not take medicines to lower the fever right away. Tell your doctor about the fever. Managing pain, stiffness, and swelling  Try these methods to help with pain: ? Use a heating pad. ? Take a warm bath. ? Distract yourself, such as by watching TV. Eating and drinking  Drink enough fluid to keep your pee (urine) clear or pale yellow. Drink more in hot weather and during exercise.  Limit or avoid alcohol.  Eat a healthy diet. Eat plenty of fruits, vegetables, whole grains, and lean protein.  Take vitamins and supplements as told by your doctor. Traveling  When traveling, keep these with you: ? Your medical information. ? The names of your doctors. ? Your medicines.  If you need to take an airplane, talk to your doctor first. Activity  Rest often.  Avoid exercises that make your heart beat much faster, such as jogging. General instructions  Do not use products that have nicotine or tobacco, such as cigarettes and e-cigarettes. If you need help quitting, ask your doctor.  Consider wearing a medical alert bracelet.  Avoid being in high places (high altitudes), such as mountains.  Avoid very hot or cold temperatures.  Avoid places where the  temperature changes a lot.  Keep all follow-up visits as told by your doctor. This is important. Contact a doctor if:  A joint hurts.  Your feet or hands hurt or swell.  You feel tired (fatigued). Get help right away if:  You have symptoms of infection. These include: ? Fever. ? Chills. ? Being very tired. ? Irritability. ? Poor eating. ? Throwing up (vomiting).  You feel dizzy or faint.  You have new stomach pain, especially on the left side.  You have a an erection (priapism) that lasts more than 4 hours.  You have numbness in your arms or legs.  You have a hard time moving your arms or legs.  You have trouble talking.  You have pain that does not go away when you take medicine.  You are short of breath.  You are breathing fast.  You have a long-term cough.  You have pain in your chest.  You have a bad headache.  You have a stiff neck.  Your stomach looks bloated even though you did not eat much.  Your skin is pale.  You suddenly cannot see well. Summary  Sickle cell anemia is a condition where your red blood cells are shaped like sickles.  Follow your doctor's advice on ways to manage pain, food to eat, activities to do, and steps to take for safe travel.  Get medical help right away if you have any signs of infection, such as a fever. This information is not intended to replace advice given to you by   your health care provider. Make sure you discuss any questions you have with your health care provider. Document Revised: 05/24/2018 Document Reviewed: 03/07/2016 Elsevier Patient Education  2020 [employer] Inc.  

## 2019-04-15 NOTE — Progress Notes (Signed)
Patient [facility]   Established Patient Office Visit  Subjective:  Patient ID: [name] Katzman., male    DOB: 1980-06-13  Age: 39 y.o. MRN: [id number]  CC:  Chief Complaint  Patient presents with  . Sickle Cell Anemia  . Medication Management    wants medication for headache and discuss pain medication     HPI  [name] Petrick., a 39 year old male with a medical history significant for sickle cell disease type SS, chronic pain syndrome, opiate dependence and tolerance, chronic kidney disease stage III, and history of anemia of chronic disease presents of for a follow up of chronic conditions.  Patient is complaining of generalized pain on today. Primarily to low back, arms, and lower extremities. Pain intensity is 7/10 characterized as constant and aching. Patient attributes pain crisis to being out of opiate medications. He has not gotten a prescription for chronic pain in greater than 1 month. Patient was found to have cocaine present in UDS on 03/21/2019 and PCP will not write opiate prescriptions. Patient says that he has not had any cocaine use since that time. Patient admits to smoking marijuana. He says that he typically uses illicit drugs for pain control.  Patient endorses migraine headaches that have been controlled with nortriptyline. He says that he is out of medication and is requesting a refill. He endorses fatigue and intolerance to cold. He denies chest pain, shortness of breath, cough, fever, chills, urinary symptoms, nausea, vomiting, or diarrhea.   Patient has a history of chronic kidney disease immune complex glomerulonephritis. Baseline creatinine is 2.0-2.8. He is followed by nephrology closely and consistently. He denies hematuria.   Past Medical History:  Diagnosis Date  . Abscess of right iliac fossa 09/24/2018   required Perc Drain 09/24/2018  . Arachnoid Cyst of brain bilaterally    "2 really small ones in the  back of my head; inside; saw them w/MRI" (09/25/2012)  . Bacterial pneumonia ~ 2012   "caught it here in the hospital" (09/25/2012)  . Chronic kidney disease    "from my sickle cell" (09/25/2012)  . CKD (chronic kidney disease), stage II   . Colitis 04/19/2017   CT scan abd/ pelvis  . GERD (gastroesophageal reflux disease)    "after I eat alot of spicey foods" (09/25/2012)  . Gynecomastia, male 07/10/2012  . History of blood transfusion    "always related to sickle cell crisis" (09/25/2012)  . 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   Noted in noted from Hematology notes at [facility]  . Migraines    "take RX qd to prevent them" (09/25/2012)  . Opioid dependence with withdrawal ([place]) 08/30/2012  . Renal insufficiency   . Sickle cell anemia (HCC)   . Sickle cell crisis ([place]) 09/25/2012  . Sickle cell nephropathy ([place]) 07/10/2012  . Sinus tachycardia   . Tachycardia with heart rate 121-140 beats per minute with ambulation 08/04/2016    Past Surgical History:  Procedure Laterality Date  . ABCESS DRAINAGE    . CHOLECYSTECTOMY  ~ 2012  . COLONOSCOPY N/A 04/23/2017   Procedure: COLONOSCOPY;  Surgeon: Irene Shipper, MD;  Location: [facility];  Service: Endoscopy;  Laterality: N/A;  . IR FLUORO GUIDE CV LINE RIGHT  12/17/2016  . IR RADIOLOGIST EVAL & MGMT  10/02/2018  . IR RADIOLOGIST EVAL & MGMT  10/15/2018  . IR REMOVAL TUN CV CATH W/O FL  12/21/2016  . IR US GUIDE VASC ACCESS RIGHT  12/17/2016  . spleenectomy      Family History  Problem Relation Age of Onset  . Breast cancer Mother     Social History   Socioeconomic History  . Marital status: Single    Spouse name: Not on file  . Number of children: Not on file  . Years of education: Not on file  . Highest education level: Not on file  Occupational History  . Not on file  Tobacco Use  . Smoking status: Current Some Day Smoker    Packs/day: 0.10    Types: Cigarettes  . Smokeless tobacco: Never Used  . Tobacco  comment: 09/25/2012 "I don't buy cigarettes; bum one from friends q now and then"  Substance and Sexual Activity  . Alcohol use: Yes    Alcohol/week: 0.0 standard drinks    Comment: Once a month  . Drug use: Not Currently    Types: Marijuana    Comment: Every 2-3 weeks   . Sexual activity: Yes  Other Topics Concern  . Not on file  Social History Narrative    Lives alone in [place].   Back at school, studying Public relations account executive. Unemployed.    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    Social Determinants of Health   Financial Resource Strain:   . Difficulty of Paying Living Expenses: Not on file  Food Insecurity:   . Worried About Charity fundraiser in the Last Year: Not on file  . Ran Out of Food in the Last Year: Not on file  Transportation Needs:   . Lack of Transportation (Medical): Not on file  . Lack of Transportation (Non-Medical): Not on file  Physical Activity:   . Days of Exercise per Week: Not on file  . Minutes of Exercise per Session: Not on file  Stress:   . Feeling of Stress : Not on file  Social Connections:   . Frequency of Communication with Friends and Family: Not on file  . Frequency of Social Gatherings with Friends and Family: Not on file  . Attends Religious Services: Not on file  . Active Member of Clubs or Organizations: Not on file  . Attends Archivist Meetings: Not on file  . Marital Status: Not on file  Intimate Partner Violence:   . Fear of Current or Ex-Partner: Not on file  . Emotionally Abused: Not on file  . Physically Abused: Not on file  . Sexually Abused: Not on file    Outpatient Medications Prior to Visit  Medication Sig Dispense Refill  . acetaminophen (TYLENOL) 500 MG tablet Take 1 tablet (500 mg total) by mouth every 6 (six) hours as needed. 30 tablet 0  . allopurinol  (ZYLOPRIM) 100 MG tablet Take 100 mg by mouth daily.    . cholecalciferol (VITAMIN D3) 25 MCG (1000 UT) tablet Take 2,000 Units by mouth daily.    . folic acid (FOLVITE) 1 MG tablet Take 1 tablet (1 mg total) by mouth daily. 30 tablet 12  . gabapentin (NEURONTIN) 300 MG capsule Take 1 capsule (300 mg total) by mouth 2 (two) times daily as needed. 60 capsule 1  . hydroxyurea (HYDREA) 500 MG capsule TAKE 3 CAPSULES BY MOUTH EVERY DAY (Patient taking differently: Take 1,500 mg by mouth daily. ) 270 capsule 1  . nortriptyline (PAMELOR) 25 MG capsule TAKE 1 CAPSULE (  25 MG TOTAL) BY MOUTH AT BEDTIME AS NEEDED FOR SLEEP. 90 capsule 3  . oxyCODONE (OXYCONTIN) 15 mg 12 hr tablet Take 1 tablet (15 mg total) by mouth every 12 (twelve) hours. 60 tablet 0  . Oxycodone HCl 10 MG TABS Take 1 tablet (10 mg total) by mouth every 6 (six) hours as needed. 60 tablet 0  . sodium bicarbonate 650 MG tablet Take 650 mg by mouth 2 (two) times daily.    . predniSONE (DELTASONE) 10 MG tablet Take 0.5 mg by mouth daily. Take 3 tabs day 1 Take 2 tabs day 2 Take 1 tab day 3 Take 0.5 tab     No facility-administered medications prior to visit.    Allergies  Allergen Reactions  . Nsaids Other (See Comments)    Kidney disease  . Morphine And Related Other (See Comments)    "Real bad headaches"     ROS Review of Systems  Constitutional: Negative.   HENT: Negative.   Eyes: Positive for photophobia.  Respiratory: Negative.   Cardiovascular: Negative.   Endocrine: Negative.   Genitourinary: Negative.   Musculoskeletal: Positive for back pain and gait problem.  Skin: Negative.   Neurological: Positive for weakness, numbness and headaches.  Psychiatric/Behavioral: Negative.       Objective:    Physical Exam  Constitutional: He is oriented to person, place, and time. He appears well-developed.  HENT:  Head: Normocephalic.  Eyes: Pupils are equal, round, and reactive to light.  Cardiovascular: Normal rate,  regular rhythm and normal pulses.  Murmur heard. Pulmonary/Chest: Effort normal and breath sounds normal.  Abdominal: Soft. Bowel sounds are normal.  Musculoskeletal:        General: Normal range of motion.     Cervical back: Normal range of motion.  Neurological: He is alert and oriented to person, place, and time.  Skin: Skin is warm and dry.  Psychiatric: He has a normal mood and affect. His behavior is normal. Judgment and thought content normal.    BP 113/69 (BP Location: Right Arm, Patient Position: Sitting, Cuff Size: Normal)   Pulse 95   Temp 98.5 F (36.9 C) (Oral)   Resp 14   Ht 5\' 3"  (1.6 m)   Wt 122 lb (55.3 kg)   SpO2 100%   BMI 21.61 kg/m  Wt Readings from Last 3 Encounters:  04/15/19 122 lb (55.3 kg)  03/21/19 130 lb (59 kg)  03/11/19 132 lb (59.9 kg)     There are no preventive care reminders to display for this patient.  There are no preventive care reminders to display for this patient.  Lab Results  Component Value Date   TSH 2.860 05/11/2017   Lab Results  Component Value Date   WBC 16.7 (H) 03/22/2019   HGB 8.6 (L) 03/22/2019   HCT 25.3 (L) 03/22/2019   MCV 86.1 03/22/2019   PLT 184 03/22/2019   Lab Results  Component Value Date   NA 137 03/22/2019   K 4.5 03/22/2019   CO2 18 (L) 03/22/2019   GLUCOSE 90 03/22/2019   BUN 27 (H) 03/22/2019   CREATININE 2.35 (H) 03/22/2019   BILITOT 4.0 (H) 03/21/2019   ALKPHOS 232 (H) 03/21/2019   AST 54 (H) 03/21/2019   ALT 34 03/21/2019   PROT 6.9 03/21/2019   ALBUMIN 3.2 (L) 03/21/2019   CALCIUM 8.4 (L) 03/22/2019   ANIONGAP 14 03/22/2019   Lab Results  Component Value Date   CHOL 145 03/07/2018   Lab Results  Component Value Date   HDL 65 03/07/2018   Lab Results  Component Value Date   LDLCALC 30 03/07/2018   Lab Results  Component Value Date   TRIG 251 (H) 03/07/2018   Lab Results  Component Value Date   CHOLHDL 2.2 03/07/2018   Lab Results  Component Value Date   HGBA1C <4.2  (L) 03/07/2018      Assessment & Plan:   Problem List Items Addressed This Visit      Other   Chronic pain syndrome   Relevant Orders   [id number] 11+Oxyco+Alc+Crt-Bund    Other Visit Diagnoses    Hb-SS disease without crisis ([place])    -  Primary   Relevant Orders   Urinalysis Dipstick (Completed)      Hb-SS disease without crisis ([place])  Continue folic acid 1 mg daily to prevent aplastic bone marrow crises.    Acute and chronic painful episodes - Will provide a short course of Oxycodone to prevent chronic pain exacerbation. Discussed the importance of refraining from illicit drug use. . We discussed that pt is to receive his Schedule II prescriptions only from [place]. Pt is also aware that the prescription history is available to [place] online through the [facility] PDMP.  Controlled substance agreement signed previously. We reminded [place]  that all patients receiving Schedule II narcotics must be seen for follow within one month of prescription being requested. We reviewed the terms of our pain agreement, including the need to keep medicines in a safe locked location away from children or pets, and the need to report excess sedation or constipation, measures to avoid constipation, and policies related to early refills and stolen prescriptions. According to the [place] [facility] program, we have reviewed details related to analgesia, adverse effects, aberrant behaviors.   - Urinalysis Dipstick - Oxycodone HCl 10 MG TABS; Take 1 tablet (10 mg total) by mouth every 6 (six) hours as needed.  Dispense: 30 tablet; Refill: 0 - CBC with Differential - CMP and Liver - Reticulocytes  Chronic pain syndrome Patient will warrant close medication management.  He has been referred to pain management in the past, but was dismissed due to non adherence.  [id number] 11+Oxyco+Alc+Crt-Bund - nortriptyline (PAMELOR) 25 MG capsule; Take 1 capsule (25 mg total) by mouth 2 (two) times daily.  Dispense: 90 capsule;  Refill: 3   History of migraine headaches - nortriptyline (PAMELOR) 25 MG capsule; Take 1 capsule (25 mg total) by mouth 2 (two) times daily.  Dispense: 90 capsule; Refill: 3  History of cocaine use:  Counseled at length. Patient declined drug rehabilitation at this time. Given information for ADS.    [facility] [address] [place]     Follow-up: Return in about 3 months (around 07/16/2019).    [name]  APRN, MSN, FNP-C Patient [facility] [address] [place], [place] [address] [phone]

## 2019-04-16 ENCOUNTER — Telehealth: Payer: Self-pay

## 2019-04-16 ENCOUNTER — Telehealth: Payer: Self-pay | Admitting: Internal Medicine

## 2019-04-16 DIAGNOSIS — Z09 Encounter for follow-up examination after completed treatment for conditions other than malignant neoplasm: Secondary | ICD-10-CM | POA: Diagnosis not present

## 2019-04-16 DIAGNOSIS — D571 Sickle-cell disease without crisis: Secondary | ICD-10-CM | POA: Diagnosis not present

## 2019-04-16 DIAGNOSIS — G894 Chronic pain syndrome: Secondary | ICD-10-CM | POA: Diagnosis not present

## 2019-04-16 DIAGNOSIS — Z79899 Other long term (current) drug therapy: Secondary | ICD-10-CM | POA: Diagnosis not present

## 2019-04-16 LAB — CBC WITH DIFFERENTIAL/PLATELET
Basophils Absolute: 0 10*3/uL (ref 0.0–0.2)
Basos: 0 %
EOS (ABSOLUTE): 0 10*3/uL (ref 0.0–0.4)
Eos: 1 %
Hematocrit: 20.8 % — ABNORMAL LOW (ref 37.5–51.0)
Hemoglobin: 6.8 g/dL — CL (ref 13.0–17.7)
Immature Grans (Abs): 0 10*3/uL (ref 0.0–0.1)
Immature Granulocytes: 0 %
Lymphocytes Absolute: 3 10*3/uL (ref 0.7–3.1)
Lymphs: 43 %
MCH: 27.9 pg (ref 26.6–33.0)
MCHC: 32.7 g/dL (ref 31.5–35.7)
MCV: 85 fL (ref 79–97)
Monocytes Absolute: 0.1 10*3/uL (ref 0.1–0.9)
Monocytes: 1 %
Neutrophils Absolute: 3.9 10*3/uL (ref 1.4–7.0)
Neutrophils: 55 %
Platelets: 82 10*3/uL — CL (ref 150–450)
RBC: 2.44 x10E6/uL — CL (ref 4.14–5.80)
RDW: 19.7 % — ABNORMAL HIGH (ref 11.6–15.4)
WBC: 7 10*3/uL (ref 3.4–10.8)

## 2019-04-16 LAB — RETICULOCYTES: Retic Ct Pct: 0.5 % — ABNORMAL LOW (ref 0.6–2.6)

## 2019-04-16 LAB — CMP AND LIVER
ALT: 31 IU/L (ref 0–44)
AST: 38 IU/L (ref 0–40)
Albumin: 3.7 g/dL — ABNORMAL LOW (ref 4.0–5.0)
Alkaline Phosphatase: 374 IU/L — ABNORMAL HIGH (ref 39–117)
BUN: 32 mg/dL — ABNORMAL HIGH (ref 6–20)
Bilirubin Total: 0.6 mg/dL (ref 0.0–1.2)
Bilirubin, Direct: 0.19 mg/dL (ref 0.00–0.40)
CO2: 13 mmol/L — ABNORMAL LOW (ref 20–29)
Calcium: 8.6 mg/dL — ABNORMAL LOW (ref 8.7–10.2)
Chloride: 112 mmol/L — ABNORMAL HIGH (ref 96–106)
Creatinine, Ser: 2.13 mg/dL — ABNORMAL HIGH (ref 0.76–1.27)
GFR calc Af Amer: 45 mL/min/{1.73_m2} — ABNORMAL LOW (ref >59)
GFR calc non Af Amer: 39 mL/min/{1.73_m2} — ABNORMAL LOW (ref >59)
Glucose: 94 mg/dL (ref 65–99)
Potassium: 5.4 mmol/L — ABNORMAL HIGH (ref 3.5–5.2)
Sodium: 140 mmol/L (ref 134–144)
Total Protein: 6.7 g/dL (ref 6.0–8.5)

## 2019-04-16 NOTE — Telephone Encounter (Signed)
Called and spoke with patient. Advised that hemoglobin is 6.8 and is consistent with previous. Asked that he come in tomorrow for repeat hemoglobin and type and screen, advised if hemoglobin has dropped or he is having symptoms then he may receive a blood transfusion in the day hospital.

## 2019-04-16 NOTE — Telephone Encounter (Signed)
-----   Message from [name], [place] sent at 04/16/2019  2:36 PM EST ----- Regarding: lab results Please inform patient that hemoglobin is 6.8, which is consistent with previous. He will need to return to clinic on 04/17/2019 to repeat hemoglobin and type and screen. If hemoglobin has dropped further and/or patient is symptomatic will transfuse in the day clinic.    [name]  APRN, MSN, FNP-C Patient [facility] [address] [place], [place] [address] [phone]

## 2019-04-16 NOTE — Telephone Encounter (Signed)
[place], Please advise on labs. Thanks!

## 2019-04-16 NOTE — Telephone Encounter (Signed)
Pt wants results of blood tests. Please call pt back.

## 2019-04-17 ENCOUNTER — Other Ambulatory Visit: Payer: Self-pay

## 2019-04-17 ENCOUNTER — Ambulatory Visit (HOSPITAL_COMMUNITY)
Admission: RE | Admit: 2019-04-17 | Discharge: 2019-04-17 | Disposition: A | Discharge status: Home / Self Care | Payer: Medicare Other | Source: Ambulatory Visit | Attending: Internal Medicine | Admitting: Internal Medicine

## 2019-04-17 ENCOUNTER — Non-Acute Institutional Stay (HOSPITAL_COMMUNITY)
Admission: AD | Admit: 2019-04-17 | Discharge: 2019-04-17 | Disposition: A | Discharge status: Home / Self Care | Payer: Medicare Other | Source: Ambulatory Visit | Attending: Internal Medicine | Admitting: Internal Medicine

## 2019-04-17 DIAGNOSIS — N058 Unspecified nephritic syndrome with other morphologic changes: Secondary | ICD-10-CM | POA: Diagnosis not present

## 2019-04-17 DIAGNOSIS — D638 Anemia in other chronic diseases classified elsewhere: Secondary | ICD-10-CM | POA: Diagnosis not present

## 2019-04-17 DIAGNOSIS — D5700 Hb-SS disease with crisis, unspecified: Secondary | ICD-10-CM | POA: Insufficient documentation

## 2019-04-17 DIAGNOSIS — Z01812 Encounter for preprocedural laboratory examination: Secondary | ICD-10-CM | POA: Diagnosis not present

## 2019-04-17 DIAGNOSIS — N182 Chronic kidney disease, stage 2 (mild): Secondary | ICD-10-CM | POA: Insufficient documentation

## 2019-04-17 DIAGNOSIS — G894 Chronic pain syndrome: Secondary | ICD-10-CM | POA: Insufficient documentation

## 2019-04-17 DIAGNOSIS — F17210 Nicotine dependence, cigarettes, uncomplicated: Secondary | ICD-10-CM | POA: Insufficient documentation

## 2019-04-17 DIAGNOSIS — F1120 Opioid dependence, uncomplicated: Secondary | ICD-10-CM | POA: Insufficient documentation

## 2019-04-17 DIAGNOSIS — Z79899 Other long term (current) drug therapy: Secondary | ICD-10-CM | POA: Insufficient documentation

## 2019-04-17 DIAGNOSIS — D649 Anemia, unspecified: Secondary | ICD-10-CM | POA: Diagnosis present

## 2019-04-17 LAB — CBC
HCT: 18.2 % — ABNORMAL LOW (ref 39.0–52.0)
HCT: 22.8 % — ABNORMAL LOW (ref 39.0–52.0)
Hemoglobin: 5.8 g/dL — CL (ref 13.0–17.0)
Hemoglobin: 7.3 g/dL — ABNORMAL LOW (ref 13.0–17.0)
MCH: 27.9 pg (ref 26.0–34.0)
MCH: 28.7 pg (ref 26.0–34.0)
MCHC: 31.9 g/dL (ref 30.0–36.0)
MCHC: 32 g/dL (ref 30.0–36.0)
MCV: 87.5 fL (ref 80.0–100.0)
MCV: 89.8 fL (ref 80.0–100.0)
Platelets: 61 10*3/uL — ABNORMAL LOW (ref 150–400)
Platelets: 47 10*3/uL — ABNORMAL LOW (ref 150–400)
RBC: 2.08 MIL/uL — ABNORMAL LOW (ref 4.22–5.81)
RBC: 2.54 MIL/uL — ABNORMAL LOW (ref 4.22–5.81)
RDW: 23.9 % — ABNORMAL HIGH (ref 11.5–15.5)
RDW: 21.9 % — ABNORMAL HIGH (ref 11.5–15.5)
WBC: 5.9 10*3/uL (ref 4.0–10.5)
WBC: 5.1 10*3/uL (ref 4.0–10.5)
nRBC: 0.3 % — ABNORMAL HIGH (ref 0.0–0.2)
nRBC: 0 % (ref 0.0–0.2)

## 2019-04-17 LAB — PREPARE RBC (CROSSMATCH)

## 2019-04-17 MED ORDER — SODIUM CHLORIDE 0.45 % IV SOLN: INTRAVENOUS | Status: DC

## 2019-04-17 MED ORDER — OXYCODONE HCL ER 15 MG PO T12A
15.0000 mg | EXTENDED_RELEASE_TABLET | Freq: Once | ORAL | Status: AC
  Administered 2019-04-17: 15 mg via ORAL

## 2019-04-17 MED ORDER — OXYCODONE HCL 5 MG PO TABS
10.0000 mg | ORAL_TABLET | ORAL | Status: DC | PRN
  Administered 2019-04-17: 10 mg via ORAL
  Filled 2019-04-17: qty 2

## 2019-04-17 MED ORDER — DIPHENHYDRAMINE HCL 50 MG/ML IJ SOLN
25.0000 mg | Freq: Once | INTRAMUSCULAR | Status: AC
  Administered 2019-04-17: 25 mg via INTRAVENOUS
  Filled 2019-04-17: qty 1

## 2019-04-17 MED ORDER — ACETAMINOPHEN 500 MG PO TABS
1000.0000 mg | ORAL_TABLET | Freq: Once | ORAL | Status: AC
  Administered 2019-04-17: 1000 mg via ORAL
  Filled 2019-04-17: qty 2

## 2019-04-17 MED ORDER — SODIUM CHLORIDE 0.9% IV SOLUTION: Freq: Once | INTRAVENOUS | Status: AC

## 2019-04-17 MED ORDER — HYDROMORPHONE HCL 1 MG/ML IJ SOLN
1.0000 mg | Freq: Once | INTRAMUSCULAR | Status: AC
  Administered 2019-04-17: 1 mg via INTRAVENOUS
  Filled 2019-04-17: qty 1

## 2019-04-17 NOTE — Discharge Summary (Signed)
[name] Discharge Summary   Patient ID: [name] [name]. MRN: [id number] DOB/AGE: Oct 02, 1980 39 y.o.  Admit date: 04/17/2019 Discharge date: 04/17/2019  Primary Care Physician:  [name], MD  Admission Diagnoses:  Principal Problem:   Sickle cell pain crisis [facility]) Active Problems:   Symptomatic anemia   Discharge Medications:  Allergies as of 04/17/2019      Reactions   Nsaids Other (See Comments)   Kidney disease   Morphine And Related Other (See Comments)   "Real bad headaches"       Medication List    TAKE these medications   acetaminophen 500 MG tablet Commonly known as: TYLENOL Take 1 tablet (500 mg total) by mouth every 6 (six) hours as needed.   allopurinol 100 MG tablet Commonly known as: ZYLOPRIM Take 100 mg by mouth daily.   cholecalciferol 25 MCG (1000 UNIT) tablet Commonly known as: VITAMIN D3 Take 2,000 Units by mouth daily.   folic acid 1 MG tablet Commonly known as: FOLVITE Take 1 tablet (1 mg total) by mouth daily.   gabapentin 300 MG capsule Commonly known as: NEURONTIN Take 1 capsule (300 mg total) by mouth 2 (two) times daily as needed.   hydroxyurea 500 MG capsule Commonly known as: HYDREA TAKE 3 CAPSULES BY MOUTH EVERY DAY What changed:   how much to take  how to take this  when to take this  additional instructions   nortriptyline 25 MG capsule Commonly known as: PAMELOR Take 1 capsule (25 mg total) by mouth 2 (two) times daily.   oxyCODONE 15 mg 12 hr tablet Commonly known as: OxyCONTIN Take 1 tablet (15 mg total) by mouth every 12 (twelve) hours.   Oxycodone HCl 10 MG Tabs Take 1 tablet (10 mg total) by mouth every 6 (six) hours as needed.   sodium bicarbonate 650 MG tablet Take 650 mg by mouth 2 (two) times daily.        Consults:  None  Significant Diagnostic Studies:  No results found.  History of present illness:  [name] [name], a 39 year old male with a medical  history significant for sickle cell disease type SS, chronic pain syndrome, opiate dependence and tolerance, history of cocaine use, history of anemia of chronic disease, immune complex glomerulonephritis, chronic marijuana use, and tobacco dependence presents complaining of pain to bilateral lower extremities.  Pain is consistent with previous sickle cell crisis.  Patient was evaluated in primary care on 04/15/2019 and was found to have a hemoglobin of 6.8.  Patient says that he has been fatigued over the last several days, which is been unchanged with rest.  Patient is also complaining of increased pain to bilateral lower extremities characterized as constant and aching.  He last had medication last night without sustained relief.  He denies headache, shortness of breath, dizziness, chest pain, fever, chills, urinary symptoms, nausea, vomiting, or diarrhea.  Patient has no sick contacts, he has not traveled recently, or had any exposure with someone positive for COVID-19 infection.    [facility] Course: Sickle cell pain crisis: Patient was admitted to sickle cell day infusion center for pain crisis. Pain managed with IV Dilaudid 1 mg x 1 dose OxyContin 15 mg x 1 dose Oxycodone 10 mg x 1 dose IV fluids, 0.45% saline at 50 mL/h Tylenol 1000 mg by mouth x1 dose Pain intensity decreased to 6/10.  Patient alert, oriented, and ambulating without assistance.  Symptomatic anemia:  Patient's hemoglobin decreased  to 5.8, which is below his baseline.  Baseline is 6.0-7.0 g/dL. Patient transfused 1 unit PRBCs without complication.  Posttransfusion CBC pending. Patient will follow up in primary care in 2 weeks.  Will repeat CBC at that time Patient discharged in a hemodynamically stable condition. Discharge instructions:  Resume all home medications.   Follow up with PCP as previously  scheduled.   Discussed the importance of drinking 64 ounces of water daily, dehydration of red blood  cells may lead further sickling.   Avoid all stressors that precipitate sickle cell pain crisis.     The patient was given clear instructions to go to ER or return to medical center if symptoms do not improve, worsen or new problems develop.     Physical Exam at Discharge:  BP (!) 102/57   Pulse 78   Temp 98.3 F (36.8 C) (Temporal)   Resp 18   SpO2 98%  Physical Exam HENT:     Mouth/Throat:     Mouth: Mucous membranes are moist.  Eyes:     Pupils: Pupils are equal, round, and reactive to light.  Cardiovascular:     Rate and Rhythm: Normal rate.     Pulses: Normal pulses.     Heart sounds: Murmur present.  Pulmonary:     Effort: Pulmonary effort is normal.  Abdominal:     General: Abdomen is flat. Bowel sounds are normal.  Musculoskeletal:        General: Normal range of motion.  Skin:    General: Skin is warm.  Neurological:     General: No focal deficit present.     Mental Status: He is alert. Mental status is at baseline.  Psychiatric:        Mood and Affect: Mood normal.        Thought Content: Thought content normal.        Judgment: Judgment normal.       Disposition at Discharge: Discharge disposition: 01-Home or Self Care       Discharge Orders:   Condition at Discharge:   Stable  Time spent on Discharge:  Greater than 30 minutes.  Signed: [name]  APRN, MSN, FNP-C Patient [name] [address] [place], [place] [address] [phone]  04/17/2019, 4:15 PM

## 2019-04-17 NOTE — Discharge Instructions (Signed)
Sickle Cell Anemia, Adult  Sickle cell anemia is a condition where your red blood cells are shaped like sickles. Red blood cells carry oxygen through the body. Sickle-shaped cells do not live as long as normal red blood cells. They also clump together and block blood from flowing through the blood vessels. This prevents the body from getting enough oxygen. Sickle cell anemia causes organ damage and pain. It also increases the risk of infection. Follow these instructions at home: Medicines  Take over-the-counter and prescription medicines only as told by your doctor.  If you were prescribed an antibiotic medicine, take it as told by your doctor. Do not stop taking the antibiotic even if you start to feel better.  If you develop a fever, do not take medicines to lower the fever right away. Tell your doctor about the fever. Managing pain, stiffness, and swelling  Try these methods to help with pain: ? Use a heating pad. ? Take a warm bath. ? Distract yourself, such as by watching TV. Eating and drinking  Drink enough fluid to keep your pee (urine) clear or pale yellow. Drink more in hot weather and during exercise.  Limit or avoid alcohol.  Eat a healthy diet. Eat plenty of fruits, vegetables, whole grains, and lean protein.  Take vitamins and supplements as told by your doctor. Traveling  When traveling, keep these with you: ? Your medical information. ? The names of your doctors. ? Your medicines.  If you need to take an airplane, talk to your doctor first. Activity  Rest often.  Avoid exercises that make your heart beat much faster, such as jogging. General instructions  Do not use products that have nicotine or tobacco, such as cigarettes and e-cigarettes. If you need help quitting, ask your doctor.  Consider wearing a medical alert bracelet.  Avoid being in high places (high altitudes), such as mountains.  Avoid very hot or cold temperatures.  Avoid places where the  temperature changes a lot.  Keep all follow-up visits as told by your doctor. This is important. Contact a doctor if:  A joint hurts.  Your feet or hands hurt or swell.  You feel tired (fatigued). Get help right away if:  You have symptoms of infection. These include: ? Fever. ? Chills. ? Being very tired. ? Irritability. ? Poor eating. ? Throwing up (vomiting).  You feel dizzy or faint.  You have new stomach pain, especially on the left side.  You have a an erection (priapism) that lasts more than 4 hours.  You have numbness in your arms or legs.  You have a hard time moving your arms or legs.  You have trouble talking.  You have pain that does not go away when you take medicine.  You are short of breath.  You are breathing fast.  You have a long-term cough.  You have pain in your chest.  You have a bad headache.  You have a stiff neck.  Your stomach looks bloated even though you did not eat much.  Your skin is pale.  You suddenly cannot see well. Summary  Sickle cell anemia is a condition where your red blood cells are shaped like sickles.  Follow your doctor's advice on ways to manage pain, food to eat, activities to do, and steps to take for safe travel.  Get medical help right away if you have any signs of infection, such as a fever. This information is not intended to replace advice given to you by   your health care provider. Make sure you discuss any questions you have with your health care provider. Document Revised: 05/24/2018 Document Reviewed: 03/07/2016 Elsevier Patient Education  2020 [employer]  

## 2019-04-17 NOTE — H&P (Signed)
[facility] History and Physical   Date: 04/17/2019  Patient name: [name] [name]. Medical record number: [id number] Date of birth: [birth year] Age: 39 y.o. Gender: male PCP: [name], MD  Attending physician: [name], MD  Chief Complaint: Sickle cell pain  History of Present Illness: [name] [name], a 39 year old male with a medical history significant for sickle cell disease type SS, chronic pain syndrome, opiate dependence and tolerance, history of cocaine use, history of anemia of chronic disease, immune complex glomerulonephritis, chronic marijuana use, and tobacco dependence presents complaining of pain to bilateral lower extremities.  Pain is consistent with previous sickle cell crisis.  Patient was evaluated in primary care on 04/15/2019 and was found to have a hemoglobin of 6.8.  Patient says that he has been fatigued over the last several days, which is been unchanged with rest.  Patient is also complaining of increased pain to bilateral lower extremities characterized as constant and aching.  He last had medication last night without sustained relief.  He denies headache, shortness of breath, dizziness, chest pain, fever, chills, urinary symptoms, nausea, vomiting, or diarrhea.  Patient has no sick contacts, he has not traveled recently, or had any exposure with someone positive for COVID-19 infection.  Meds: Medications Prior to Admission  Medication Sig Dispense Refill Last Dose  . acetaminophen (TYLENOL) 500 MG tablet Take 1 tablet (500 mg total) by mouth every 6 (six) hours as needed. 30 tablet 0   . allopurinol (ZYLOPRIM) 100 MG tablet Take 100 mg by mouth daily.     . cholecalciferol (VITAMIN D3) 25 MCG (1000 UT) tablet Take 2,000 Units by mouth daily.     . folic acid (FOLVITE) 1 MG tablet Take 1 tablet (1 mg total) by mouth daily. 30 tablet 12   . gabapentin (NEURONTIN) 300 MG capsule Take 1 capsule (300 mg total) by mouth 2  (two) times daily as needed. 60 capsule 1   . hydroxyurea (HYDREA) 500 MG capsule TAKE 3 CAPSULES BY MOUTH EVERY DAY (Patient taking differently: Take 1,500 mg by mouth daily. ) 270 capsule 1   . nortriptyline (PAMELOR) 25 MG capsule Take 1 capsule (25 mg total) by mouth 2 (two) times daily. 90 capsule 3   . oxyCODONE (OXYCONTIN) 15 mg 12 hr tablet Take 1 tablet (15 mg total) by mouth every 12 (twelve) hours. 60 tablet 0   . Oxycodone HCl 10 MG TABS Take 1 tablet (10 mg total) by mouth every 6 (six) hours as needed. 30 tablet 0   . sodium bicarbonate 650 MG tablet Take 650 mg by mouth 2 (two) times daily.       Allergies: Nsaids and Morphine and related Past Medical History:  Diagnosis Date  . Abscess of right iliac fossa 09/24/2018   required Perc Drain 09/24/2018  . Arachnoid Cyst of brain bilaterally    "2 really small ones in the back of my head; inside; saw them w/MRI" (09/25/2012)  . Bacterial pneumonia ~ 2012   "caught it here in the hospital" (09/25/2012)  . Chronic kidney disease    "from my sickle cell" (09/25/2012)  . CKD (chronic kidney disease), stage II   . Colitis 04/19/2017   CT scan abd/ pelvis  . GERD (gastroesophageal reflux disease)    "after I eat alot of spicey foods" (09/25/2012)  . Gynecomastia, male 07/10/2012  . History of blood transfusion    "always related to sickle cell crisis" (09/25/2012)  .  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   Noted in noted from Hematology notes at [facility]  . Migraines    "take RX qd to prevent them" (09/25/2012)  . Opioid dependence with withdrawal ([facility]) 08/30/2012  . Renal insufficiency   . Sickle cell anemia ([facility])   . Sickle cell crisis ([facility]) 09/25/2012  . Sickle cell nephropathy ([facility]) 07/10/2012  . Sinus tachycardia   . Tachycardia with heart rate 121-140 beats per minute with ambulation 08/04/2016   Past Surgical History:  Procedure Laterality Date  . ABCESS DRAINAGE    . CHOLECYSTECTOMY  ~ 2012  . COLONOSCOPY  N/A 04/23/2017   Procedure: COLONOSCOPY;  Surgeon: Irene Shipper, MD;  Location: [facility];  Service: Endoscopy;  Laterality: N/A;  . IR FLUORO GUIDE CV LINE RIGHT  12/17/2016  . IR RADIOLOGIST EVAL & MGMT  10/02/2018  . IR RADIOLOGIST EVAL & MGMT  10/15/2018  . IR REMOVAL TUN CV CATH W/O FL  12/21/2016  . IR US GUIDE VASC ACCESS RIGHT  12/17/2016  . spleenectomy     Family History  Problem Relation Age of Onset  . Breast cancer Mother    Social History   Socioeconomic History  . Marital status: Single    Spouse name: Not on file  . Number of children: Not on file  . Years of education: Not on file  . Highest education level: Not on file  Occupational History  . Not on file  Tobacco Use  . Smoking status: Current Some Day Smoker    Packs/day: 0.10    Types: Cigarettes  . Smokeless tobacco: Never Used  . Tobacco comment: 09/25/2012 "I don't buy cigarettes; bum one from friends q now and then"  Substance and Sexual Activity  . Alcohol use: Yes    Alcohol/week: 0.0 standard drinks    Comment: Once a month  . Drug use: Not Currently    Types: Marijuana    Comment: Every 2-3 weeks   . Sexual activity: Yes  Other Topics Concern  . Not on file  Social History Narrative    Lives alone in [place].   Back at school, studying Public relations account executive. Unemployed.    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    Social Determinants of Health   Financial Resource Strain:   . Difficulty of Paying Living Expenses: Not on file  Food Insecurity:   . Worried About Charity fundraiser in the Last Year: Not on file  . Ran Out of Food in the Last Year: Not on file  Transportation Needs:   . Lack of Transportation (Medical): Not on file  . Lack of Transportation (Non-Medical): Not on file  Physical Activity:   . Days of Exercise per Week: Not on file   . Minutes of Exercise per Session: Not on file  Stress:   . Feeling of Stress : Not on file  Social Connections:   . Frequency of Communication with Friends and Family: Not on file  . Frequency of Social Gatherings with Friends and Family: Not on file  . Attends Religious Services: Not on file  . Active Member of Clubs or Organizations: Not on file  . Attends Archivist Meetings: Not on file  . Marital Status: Not on file  Intimate Partner Violence:   . Fear of Current or Ex-Partner: Not on file  .  Emotionally Abused: Not on file  . Physically Abused: Not on file  . Sexually Abused: Not on file   Review of Systems  Constitutional: Negative.   HENT: Negative.   Respiratory: Negative.  Negative for shortness of breath.   Cardiovascular: Negative.   Gastrointestinal: Negative for abdominal pain, diarrhea, nausea and vomiting.  Genitourinary: Negative for dysuria.  Musculoskeletal: Positive for joint pain.  Skin: Negative.   Neurological: Negative.   Psychiatric/Behavioral: Negative.     Physical Exam: There were no vitals taken for this visit. Physical Exam Constitutional:      Appearance: Normal appearance.  HENT:     Mouth/Throat:     Mouth: Mucous membranes are moist.     Pharynx: Oropharynx is clear.  Eyes:     Pupils: Pupils are equal, round, and reactive to light.  Cardiovascular:     Rate and Rhythm: Normal rate and regular rhythm.     Heart sounds: Murmur present.  Abdominal:     General: Abdomen is flat. Bowel sounds are normal.  Musculoskeletal:        General: Normal range of motion.  Neurological:     General: No focal deficit present.     Mental Status: He is alert. Mental status is at baseline.  Psychiatric:        Mood and Affect: Mood normal.        Thought Content: Thought content normal.        Judgment: Judgment normal.     Lab results: Results for orders placed or performed during the hospital encounter of 04/17/19 (from the past  24 hour(s))  Prepare RBC     Status: None   Collection Time: 04/17/19 11:50 AM  Result Value Ref Range   Order Confirmation      ORDER PROCESSED BY BLOOD BANK Performed at [facility], [place] [address], [place], [place] [address]     Imaging results:  No results found.   Assessment & Plan: Patient admitted to sickle cell day infusion center for management of pain crisis.  Also admitted for symptomatic anemia. Repeated CBC with differential.  Hemoglobin has decreased to 5.8, transfuse 1 unit PRBCs. Pain managed with IV Dilaudid 1 mg x 1 dose Oxycodone 10 mg by mouth x1 dose OxyContin 15 mg x 1 dose IV fluids, 0.45% saline at 50 mL/h Pain will be reevaluated in context of functioning in relationship to baseline as his care progresses If pain intensity does not improve or there is a change in hemodynamic stability consider transitioning for inpatient care    [name]  APRN, MSN, FNP-C Patient [place] [address] [place], [place] [address] [phone] 04/17/2019, 11:01 AM

## 2019-04-17 NOTE — Progress Notes (Signed)
Patient admitted to the day hospital for treatment of sickle cell pain crisis. Lab draws - CBC and type and cross were done. Patient transfused with 1 unit of packed red blood cells. Post transfusion CBC was drawn.  Patient reported pain rated 7/10 in the legs. Patient hydrated with IV fluids, given IV Dilaudid, IV Benadryl, PO Oxycodone, PO Oxycontin and PO Tylenol.  At discharge patient reported  pain at 6/10. Discharge instructions given to the patient. Alert, oriented and ambulatory at discharge.

## 2019-04-17 NOTE — Progress Notes (Signed)
CRITICAL VALUE ALERT  Critical Value:  Hemoglobin 5.8  Date & Time Notied:  04/17/2019; 11:05 am  Provider Notified: Cammie Sickle FNP  Orders Received/Actions taken: To be infused with 1 unit of packed red blood cells.

## 2019-04-18 LAB — BPAM RBC
Blood Product Expiration Date: 202103232359
ISSUE DATE / TIME: 202103041228
Unit Type and Rh: 9500

## 2019-04-18 LAB — TYPE AND SCREEN
ABO/RH(D): O POS
Antibody Screen: NEGATIVE
Donor AG Type: NEGATIVE
Unit division: 0

## 2019-04-19 DIAGNOSIS — Z8669 Personal history of other diseases of the nervous system and sense organs: Secondary | ICD-10-CM | POA: Insufficient documentation

## 2019-04-19 DIAGNOSIS — F1491 Cocaine use, unspecified, in remission: Secondary | ICD-10-CM | POA: Insufficient documentation

## 2019-04-19 DIAGNOSIS — Z87898 Personal history of other specified conditions: Secondary | ICD-10-CM | POA: Insufficient documentation

## 2019-04-21 LAB — DRUG SCREEN 764883 11+OXYCO+ALC+CRT-BUND
Amphetamines, Urine: NEGATIVE ng/mL
BENZODIAZ UR QL: NEGATIVE ng/mL
Barbiturate: NEGATIVE ng/mL
Cocaine (Metabolite): NEGATIVE ng/mL
Creatinine: 46.8 mg/dL (ref 20.0–300.0)
Ethanol: NEGATIVE %
Meperidine: NEGATIVE ng/mL
Methadone Screen, Urine: NEGATIVE ng/mL
OPIATE SCREEN URINE: NEGATIVE ng/mL
Phencyclidine: NEGATIVE ng/mL
Propoxyphene: NEGATIVE ng/mL
Tramadol: NEGATIVE ng/mL
pH, Urine: 6.3 (ref 4.5–8.9)

## 2019-04-21 LAB — CANNABINOID CONFIRMATION, UR
CANNABINOIDS: POSITIVE — AB
Carboxy THC GC/MS Conf: >300 ng/mL

## 2019-04-21 LAB — OXYCODONE/OXYMORPHONE, CONFIRM: OXYCODONE/OXYMORPH: NEGATIVE

## 2019-04-25 ENCOUNTER — Telehealth: Payer: Self-pay | Admitting: Internal Medicine

## 2019-04-25 ENCOUNTER — Telehealth: Payer: Self-pay | Admitting: Family Medicine

## 2019-04-25 ENCOUNTER — Other Ambulatory Visit: Payer: Self-pay | Admitting: Family Medicine

## 2019-04-25 NOTE — Telephone Encounter (Signed)
Pt wants refill on oxycotin 15 mg. Pt out of oxycodone 10mg . Please call pt back. Pt requested a call.

## 2019-04-25 NOTE — Progress Notes (Signed)
[name] [name], a 39 year old male with a medical history significant for sickle cell disease type SS, chronic pain syndrome, opiate dependence and tolerance, history of cocaine abuse, history of chronic kidney disease stage IV, and history of anemia of chronic disease called requesting prescriptions for oxycodone and OxyContin. Discussed pain management agreement with patient. Patient is to report on 05/02/2019 for repeat UDS. Will discuss pain medication regimen further going forward. No prescriptions will be sent to pharmacy on today. [name] and I also had this conversation on 04/17/2019. Care management plan has not changed.    [name]  APRN, MSN, FNP-C Patient [place] [address] [place], [place] [address] [phone]

## 2019-04-25 NOTE — Telephone Encounter (Signed)
[name] [name], a 39 year old male with a medical history significant for sickle cell disease type SS, chronic pain syndrome, opiate dependence and tolerance, history of cocaine abuse, history of chronic kidney disease stage IV, and history of anemia of chronic disease called requesting prescriptions for oxycodone and OxyContin. Discussed pain management agreement with patient. Patient is to report on 05/02/2019 for repeat UDS. Will discuss pain medication regimen further going forward. No prescriptions will be sent to pharmacy on today. Mr. Oki and I also had this conversation on 04/17/2019. Care management plan has not changed.   [name]  APRN, MSN, FNP-C Patient [place] [address] [place], [place] [address] [phone]

## 2019-04-30 ENCOUNTER — Ambulatory Visit: Payer: Medicare Other | Admitting: Nurse Practitioner

## 2019-05-02 ENCOUNTER — Ambulatory Visit: Payer: Medicare Other

## 2019-05-02 ENCOUNTER — Other Ambulatory Visit: Payer: Self-pay

## 2019-05-02 DIAGNOSIS — F1190 Opioid use, unspecified, uncomplicated: Secondary | ICD-10-CM

## 2019-05-02 DIAGNOSIS — G894 Chronic pain syndrome: Secondary | ICD-10-CM

## 2019-05-06 ENCOUNTER — Inpatient Hospital Stay (HOSPITAL_COMMUNITY)
Admission: EM | Admit: 2019-05-06 | Discharge: 2019-05-08 | DRG: 812 | Disposition: A | Discharge status: Home / Self Care | Payer: Medicare Other | Attending: Internal Medicine | Admitting: Internal Medicine

## 2019-05-06 ENCOUNTER — Other Ambulatory Visit: Payer: Self-pay

## 2019-05-06 ENCOUNTER — Encounter (HOSPITAL_COMMUNITY): Payer: Self-pay

## 2019-05-06 ENCOUNTER — Other Ambulatory Visit: Payer: Medicare Other

## 2019-05-06 ENCOUNTER — Emergency Department (HOSPITAL_COMMUNITY): Payer: Medicare Other

## 2019-05-06 ENCOUNTER — Telehealth: Payer: Self-pay | Admitting: Family Medicine

## 2019-05-06 DIAGNOSIS — Z885 Allergy status to narcotic agent status: Secondary | ICD-10-CM | POA: Diagnosis not present

## 2019-05-06 DIAGNOSIS — D571 Sickle-cell disease without crisis: Secondary | ICD-10-CM

## 2019-05-06 DIAGNOSIS — F17210 Nicotine dependence, cigarettes, uncomplicated: Secondary | ICD-10-CM | POA: Diagnosis present

## 2019-05-06 DIAGNOSIS — Z9049 Acquired absence of other specified parts of digestive tract: Secondary | ICD-10-CM | POA: Diagnosis not present

## 2019-05-06 DIAGNOSIS — Z886 Allergy status to analgesic agent status: Secondary | ICD-10-CM | POA: Diagnosis not present

## 2019-05-06 DIAGNOSIS — Z20822 Contact with and (suspected) exposure to covid-19: Secondary | ICD-10-CM | POA: Diagnosis present

## 2019-05-06 DIAGNOSIS — D61818 Other pancytopenia: Secondary | ICD-10-CM

## 2019-05-06 DIAGNOSIS — G894 Chronic pain syndrome: Secondary | ICD-10-CM | POA: Diagnosis not present

## 2019-05-06 DIAGNOSIS — N058 Unspecified nephritic syndrome with other morphologic changes: Secondary | ICD-10-CM | POA: Diagnosis present

## 2019-05-06 DIAGNOSIS — D649 Anemia, unspecified: Secondary | ICD-10-CM

## 2019-05-06 DIAGNOSIS — R079 Chest pain, unspecified: Secondary | ICD-10-CM | POA: Diagnosis not present

## 2019-05-06 DIAGNOSIS — Z79899 Other long term (current) drug therapy: Secondary | ICD-10-CM | POA: Diagnosis not present

## 2019-05-06 DIAGNOSIS — N08 Glomerular disorders in diseases classified elsewhere: Secondary | ICD-10-CM | POA: Diagnosis present

## 2019-05-06 DIAGNOSIS — Z8701 Personal history of pneumonia (recurrent): Secondary | ICD-10-CM

## 2019-05-06 DIAGNOSIS — G43909 Migraine, unspecified, not intractable, without status migrainosus: Secondary | ICD-10-CM | POA: Diagnosis present

## 2019-05-06 DIAGNOSIS — N184 Chronic kidney disease, stage 4 (severe): Secondary | ICD-10-CM | POA: Diagnosis present

## 2019-05-06 DIAGNOSIS — Z87898 Personal history of other specified conditions: Secondary | ICD-10-CM

## 2019-05-06 DIAGNOSIS — N179 Acute kidney failure, unspecified: Secondary | ICD-10-CM | POA: Diagnosis present

## 2019-05-06 DIAGNOSIS — F1123 Opioid dependence with withdrawal: Secondary | ICD-10-CM | POA: Diagnosis present

## 2019-05-06 DIAGNOSIS — F1491 Cocaine use, unspecified, in remission: Secondary | ICD-10-CM

## 2019-05-06 DIAGNOSIS — G930 Cerebral cysts: Secondary | ICD-10-CM | POA: Diagnosis present

## 2019-05-06 DIAGNOSIS — R0602 Shortness of breath: Secondary | ICD-10-CM | POA: Diagnosis not present

## 2019-05-06 DIAGNOSIS — D5700 Hb-SS disease with crisis, unspecified: Principal | ICD-10-CM | POA: Diagnosis present

## 2019-05-06 DIAGNOSIS — K219 Gastro-esophageal reflux disease without esophagitis: Secondary | ICD-10-CM | POA: Diagnosis present

## 2019-05-06 DIAGNOSIS — N189 Chronic kidney disease, unspecified: Secondary | ICD-10-CM | POA: Diagnosis not present

## 2019-05-06 DIAGNOSIS — Z8669 Personal history of other diseases of the nervous system and sense organs: Secondary | ICD-10-CM

## 2019-05-06 LAB — COMPREHENSIVE METABOLIC PANEL
ALT: 21 U/L (ref 0–44)
AST: 21 U/L (ref 15–41)
Albumin: 3 g/dL — ABNORMAL LOW (ref 3.5–5.0)
Alkaline Phosphatase: 269 U/L — ABNORMAL HIGH (ref 38–126)
Anion gap: 10 (ref 5–15)
BUN: 37 mg/dL — ABNORMAL HIGH (ref 6–20)
CO2: 19 mmol/L — ABNORMAL LOW (ref 22–32)
Calcium: 8.6 mg/dL — ABNORMAL LOW (ref 8.9–10.3)
Chloride: 106 mmol/L (ref 98–111)
Creatinine, Ser: 2.6 mg/dL — ABNORMAL HIGH (ref 0.61–1.24)
GFR calc Af Amer: 35 mL/min — ABNORMAL LOW (ref >60)
GFR calc non Af Amer: 30 mL/min — ABNORMAL LOW (ref >60)
Glucose, Bld: 107 mg/dL — ABNORMAL HIGH (ref 70–99)
Potassium: 4.3 mmol/L (ref 3.5–5.1)
Sodium: 135 mmol/L (ref 135–145)
Total Bilirubin: 0.8 mg/dL (ref 0.3–1.2)
Total Protein: 7.1 g/dL (ref 6.5–8.1)

## 2019-05-06 LAB — CBC
HCT: 14 % — ABNORMAL LOW (ref 39.0–52.0)
Hemoglobin: 4.4 g/dL — CL (ref 13.0–17.0)
MCH: 28.2 pg (ref 26.0–34.0)
MCHC: 31.4 g/dL (ref 30.0–36.0)
MCV: 89.7 fL (ref 80.0–100.0)
Platelets: 52 10*3/uL — ABNORMAL LOW (ref 150–400)
RBC: 1.56 MIL/uL — ABNORMAL LOW (ref 4.22–5.81)
RDW: 22.4 % — ABNORMAL HIGH (ref 11.5–15.5)
WBC: 3.8 10*3/uL — ABNORMAL LOW (ref 4.0–10.5)
nRBC: 0 % (ref 0.0–0.2)

## 2019-05-06 LAB — HIV ANTIBODY (ROUTINE TESTING W REFLEX): HIV Screen 4th Generation wRfx: NONREACTIVE

## 2019-05-06 LAB — PREPARE RBC (CROSSMATCH)

## 2019-05-06 LAB — SARS CORONAVIRUS 2 (TAT 6-24 HRS): SARS Coronavirus 2: NEGATIVE

## 2019-05-06 LAB — URINALYSIS, COMPLETE (UACMP) WITH MICROSCOPIC
Bacteria, UA: NONE SEEN
Bilirubin Urine: NEGATIVE
Glucose, UA: 50 mg/dL — AB
Hgb urine dipstick: NEGATIVE
Ketones, ur: NEGATIVE mg/dL
Leukocytes,Ua: NEGATIVE
Nitrite: NEGATIVE
Protein, ur: >=300 mg/dL — AB
Specific Gravity, Urine: 1.008 (ref 1.005–1.030)
pH: 7 (ref 5.0–8.0)

## 2019-05-06 LAB — RAPID URINE DRUG SCREEN, HOSP PERFORMED
Amphetamines: NOT DETECTED
Barbiturates: NOT DETECTED
Benzodiazepines: NOT DETECTED
Cocaine: NOT DETECTED
Opiates: POSITIVE — AB
Tetrahydrocannabinol: POSITIVE — AB

## 2019-05-06 LAB — RETICULOCYTES
Immature Retic Fract: 4.8 % (ref 2.3–15.9)
RBC.: 1.55 MIL/uL — ABNORMAL LOW (ref 4.22–5.81)
Retic Count, Absolute: 4.5 10*3/uL — ABNORMAL LOW (ref 19.0–186.0)
Retic Ct Pct: 0.3 % — ABNORMAL LOW (ref 0.4–3.1)

## 2019-05-06 LAB — TROPONIN I (HIGH SENSITIVITY): Troponin I (High Sensitivity): 16 ng/L (ref <18)

## 2019-05-06 MED ORDER — HYDROMORPHONE 1 MG/ML IV SOLN
INTRAVENOUS | Status: DC
  Administered 2019-05-06: 2.5 mg via INTRAVENOUS
  Administered 2019-05-06: 30 mg via INTRAVENOUS
  Administered 2019-05-06 – 2019-05-07 (×2): 2.5 mg via INTRAVENOUS
  Administered 2019-05-07: 4 mg via INTRAVENOUS
  Administered 2019-05-07: 3.5 mg via INTRAVENOUS
  Filled 2019-05-06: qty 30

## 2019-05-06 MED ORDER — GABAPENTIN 300 MG PO CAPS: 300.0000 mg | ORAL_CAPSULE | Freq: Two times a day (BID) | ORAL | Status: DC | PRN

## 2019-05-06 MED ORDER — ACETAMINOPHEN 325 MG PO TABS
650.0000 mg | ORAL_TABLET | Freq: Once | ORAL | Status: AC
  Administered 2019-05-06: 650 mg via ORAL
  Filled 2019-05-06: qty 2

## 2019-05-06 MED ORDER — HYDROMORPHONE HCL 2 MG/ML IJ SOLN
2.0000 mg | INTRAMUSCULAR | Status: AC
  Administered 2019-05-06: 2 mg via INTRAVENOUS
  Filled 2019-05-06: qty 1

## 2019-05-06 MED ORDER — DIPHENHYDRAMINE HCL 50 MG/ML IJ SOLN
25.0000 mg | Freq: Once | INTRAMUSCULAR | Status: AC
  Administered 2019-05-06: 25 mg via INTRAVENOUS
  Filled 2019-05-06: qty 1

## 2019-05-06 MED ORDER — DEXTROSE-NACL 5-0.45 % IV SOLN: INTRAVENOUS | Status: DC

## 2019-05-06 MED ORDER — SODIUM CHLORIDE 0.9% FLUSH
3.0000 mL | Freq: Once | INTRAVENOUS | Status: AC
  Administered 2019-05-06: 3 mL via INTRAVENOUS

## 2019-05-06 MED ORDER — SENNOSIDES-DOCUSATE SODIUM 8.6-50 MG PO TABS
1.0000 | ORAL_TABLET | Freq: Two times a day (BID) | ORAL | Status: DC
  Filled 2019-05-06 (×3): qty 1

## 2019-05-06 MED ORDER — NALOXONE HCL 0.4 MG/ML IJ SOLN: 0.4000 mg | INTRAMUSCULAR | Status: DC | PRN

## 2019-05-06 MED ORDER — SODIUM CHLORIDE 0.9% FLUSH: 9.0000 mL | INTRAVENOUS | Status: DC | PRN

## 2019-05-06 MED ORDER — ALLOPURINOL 100 MG PO TABS
100.0000 mg | ORAL_TABLET | Freq: Every day | ORAL | Status: DC
  Administered 2019-05-06 – 2019-05-08 (×3): 100 mg via ORAL
  Filled 2019-05-06 (×3): qty 1

## 2019-05-06 MED ORDER — ONDANSETRON HCL 4 MG/2ML IJ SOLN
4.0000 mg | INTRAMUSCULAR | Status: DC | PRN
  Administered 2019-05-06 – 2019-05-07 (×2): 4 mg via INTRAVENOUS
  Filled 2019-05-06 (×2): qty 2

## 2019-05-06 MED ORDER — POLYETHYLENE GLYCOL 3350 17 G PO PACK: 17.0000 g | PACK | Freq: Every day | ORAL | Status: DC | PRN

## 2019-05-06 MED ORDER — FOLIC ACID 1 MG PO TABS
1.0000 mg | ORAL_TABLET | Freq: Every day | ORAL | Status: DC
  Administered 2019-05-06 – 2019-05-08 (×3): 1 mg via ORAL
  Filled 2019-05-06 (×3): qty 1

## 2019-05-06 MED ORDER — SODIUM CHLORIDE 0.9% IV SOLUTION: Freq: Once | INTRAVENOUS | Status: AC

## 2019-05-06 MED ORDER — OXYCODONE HCL ER 15 MG PO T12A
15.0000 mg | EXTENDED_RELEASE_TABLET | Freq: Two times a day (BID) | ORAL | Status: DC
  Administered 2019-05-06 – 2019-05-08 (×4): 15 mg via ORAL
  Filled 2019-05-06 (×5): qty 1
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
## 2019-05-06 NOTE — H&P (Signed)
H&P  Patient Demographics:  [name] [name], is a 39 y.o. male  MRN: [id number]   DOB - Jun 30, 1980  Admit Date - 05/06/2019  Outpatient Primary MD for the patient is [name], MD  Chief Complaint  Patient presents with  . Chest Pain  . Shortness of Breath  . Sickle Cell Pain Crisis      HPI:   [name] [name]  is a 39 y.o. 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  Pain is consistent with patient's previous sickle cell pain crises.  Patient attributes pain crisis to being out pain medications over the past several days.  Pain intensity is 10/10 characterized as constant and throbbing.  Patient also endorses shortness of breath and dizziness.  He denies headache, paresthesias, urinary symptoms, nausea, vomiting, or diarrhea.  Patient has no sick contacts, recent travel, or exposure to COVID-19 infection.  ER course: Patient found to have temperature of 98.7 F, BP 115/71, P106, respirations 16, oxygen saturation 99% on RA.  Today, patient has pancytopenia.  WBCs 3.8, hemoglobin 4.0, and platelets 52.  Patient's creatinine is 2.6, which is increased from baseline of 2.1-2.2.  Patient's pain persists despite IV Dilaudid and IV fluids.  Patient admitted to telemetry for management of sickle cell pain crisis and symptomatic anemia.   Review of systems:  In addition to the HPI above, patient reports No fever or chills No Headache, No changes with vision or hearing No problems swallowing food or liquids No chest pain, cough or shortness of breath No Abdominal pain, No Nausea or Vomiting, Bowel movements are regular No  blood in stool or urine No dysuria No new skin rashes or bruises No new joints pains-aches No new weakness, tingling, numbness in any extremity No recent weight gain or loss No polyuria, polydypsia or polyphagia No significant Mental Stressors  A full 10 point Review of Systems was done, except as stated above, all other Review of Systems were negative.  With Past History of the following :   Past Medical History:  Diagnosis Date  . Abscess of right iliac fossa 09/24/2018   required Perc Drain 09/24/2018  . Arachnoid Cyst of brain bilaterally    "2 really small ones in the back of my head; inside; saw them w/MRI" (09/25/2012)  . Bacterial pneumonia ~ 2012   "caught it here in the hospital" (09/25/2012)  . Chronic kidney disease    "from my sickle cell" (09/25/2012)  . CKD (chronic kidney disease), stage II   . Colitis 04/19/2017   CT scan abd/ pelvis  . GERD (gastroesophageal reflux disease)    "after I eat alot of spicey foods" (09/25/2012)  . Gynecomastia, male 07/10/2012  . History of blood transfusion    "always related to sickle cell crisis" (09/25/2012)  . 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   Noted in noted from H[facility] at [facility]  . Migraines    "take RX qd to prevent them" (09/25/2012)  . Opioid dependence with withdrawal ([place]) 08/30/2012  . Renal insufficiency   . Sickle cell anemia (HCC)   . Sickle cell crisis ([place]) 09/25/2012  . Sickle cell nephropathy ([place]) 07/10/2012  . Sinus tachycardia   .  Tachycardia with heart rate 121-140 beats per minute with ambulation 08/04/2016      Past Surgical History:  Procedure Laterality Date  . ABCESS DRAINAGE    . CHOLECYSTECTOMY  ~ 2012  . COLONOSCOPY N/A 04/23/2017   Procedure: COLONOSCOPY;  Surgeon: Irene Shipper, MD;  Location: [facility];  Service: Endoscopy;  Laterality: N/A;  . IR FLUORO GUIDE CV LINE RIGHT  12/17/2016  . IR RADIOLOGIST EVAL & MGMT  10/02/2018  . IR RADIOLOGIST EVAL & MGMT   10/15/2018  . IR REMOVAL TUN CV CATH W/O FL  12/21/2016  . IR US GUIDE VASC ACCESS RIGHT  12/17/2016  . spleenectomy       Social History:   Social History   Tobacco Use  . Smoking status: Current Some Day Smoker    Packs/day: 0.10    Types: Cigarettes  . Smokeless tobacco: Never Used  . Tobacco comment: 09/25/2012 "I don't buy cigarettes; bum one from friends q now and then"  Substance Use Topics  . Alcohol use: Yes    Alcohol/week: 0.0 standard drinks    Comment: Once a month     Lives - At home   Family History :   Family History  Problem Relation Age of Onset  . Breast cancer Mother      Home Medications:   Prior to Admission medications   Medication Sig Start Date End Date Taking? Authorizing Provider  acetaminophen (TYLENOL) 500 MG tablet Take 1 tablet (500 mg total) by mouth every 6 (six) hours as needed. 03/26/19   [name], FNP  allopurinol (ZYLOPRIM) 100 MG tablet Take 100 mg by mouth daily. 03/18/19 03/17/20  [provider]  cholecalciferol (VITAMIN D3) 25 MCG (1000 UT) tablet Take 2,000 Units by mouth daily.    [provider]  Cholecalciferol (VITAMIN D3) 50 MCG (2000 UT) TABS TAKE 1 TABLET (2,000 UNITS TOTAL) BY MOUTH 2 (TWO) TIMES DAILY 03/18/19   [provider]  folic acid (FOLVITE) 1 MG tablet Take 1 tablet (1 mg total) by mouth daily. 03/11/19   [name], FNP  gabapentin (NEURONTIN) 300 MG capsule Take 1 capsule (300 mg total) by mouth 2 (two) times daily as needed. 03/26/19   [name], FNP  hydroxyurea (HYDREA) 500 MG capsule TAKE 3 CAPSULES BY MOUTH EVERY DAY Patient taking differently: Take 1,500 mg by mouth daily.  12/24/18   [name], FNP  nortriptyline (PAMELOR) 25 MG capsule Take 1 capsule (25 mg total) by mouth 2 (two) times daily. 04/15/19   [name], FNP  oxyCODONE (OXYCONTIN) 15 mg 12 hr tablet Take 1 tablet (15 mg total) by mouth every 12 (twelve) hours. 03/11/19   [name], FNP   Oxycodone HCl 10 MG TABS Take 1 tablet (10 mg total) by mouth every 6 (six) hours as needed. 04/15/19   [name], FNP  sodium bicarbonate 650 MG tablet Take 650 mg by mouth 2 (two) times daily. 11/07/18   [provider]     Allergies:   Allergies  Allergen Reactions  . Nsaids Other (See Comments)    Kidney disease  . Morphine And Related Other (See Comments)    "Real bad headaches"      Physical Exam:   Vitals:   Vitals:   05/06/19 1501 05/06/19 1530  BP: 115/79 105/79  Pulse: (!) 115 (!) 124  Resp: 18 18  Temp:    SpO2: 98% 94%   Physical Exam  Constitutional:      General: He is not in acute distress. HENT:     Head: Normocephalic.     Mouth/Throat:     Mouth: Mucous membranes are moist.  Eyes:     General: Scleral icterus present.     Pupils: Pupils are equal, round, and reactive to light.  Cardiovascular:     Rate and Rhythm: Regular rhythm. Tachycardia present.     Heart sounds: Murmur present. Diastolic murmur present.  Pulmonary:     Effort: Pulmonary effort is normal.     Breath sounds: Normal breath sounds.  Abdominal:     Palpations: Abdomen is soft.  Musculoskeletal:        General: Normal range of motion.  Skin:    General: Skin is warm.  Neurological:     General: No focal deficit present.     Mental Status: He is disoriented.  Psychiatric:        Mood and Affect: Mood normal.        Behavior: Behavior normal.       Data Review:   CBC Recent Labs  Lab 05/06/19 1355  WBC 3.8*  HGB 4.4*  HCT 14.0*  PLT 52*  MCV 89.7  MCH 28.2  MCHC 31.4  RDW 22.4*   ------------------------------------------------------------------------------------------------------------------  Chemistries  Recent Labs  Lab 05/06/19 1355  NA 135  K 4.3  CL 106  CO2 19*  GLUCOSE 107*  BUN 37*  CREATININE 2.60*  CALCIUM 8.6*  AST 21  ALT 21  ALKPHOS 269*  BILITOT 0.8    ------------------------------------------------------------------------------------------------------------------ estimated creatinine clearance is 31.5 mL/min (A) (by C-G formula based on SCr of 2.6 mg/dL (H)). ------------------------------------------------------------------------------------------------------------------ No results for input(s): TSH, T4TOTAL, T3FREE, THYROIDAB in the last 72 hours.  Invalid input(s): FREET3  Coagulation profile No results for input(s): INR, PROTIME in the last 168 hours. ------------------------------------------------------------------------------------------------------------------- No results for input(s): DDIMER in the last 72 hours. -------------------------------------------------------------------------------------------------------------------  Cardiac Enzymes No results for input(s): CKMB, TROPONINI, MYOGLOBIN in the last 168 hours.  Invalid input(s): CK ------------------------------------------------------------------------------------------------------------------ No results found for: BNP  ---------------------------------------------------------------------------------------------------------------  Urinalysis    Component Value Date/Time   COLORURINE YELLOW 03/21/2019 [place] 03/21/2019 1420   LABSPEC 1.009 03/21/2019 1420   PHURINE 8.0 03/21/2019 1420   GLUCOSEU 150 (A) 03/21/2019 1420   HGBUR LARGE (A) 03/21/2019 1420   HGBUR small 03/10/2010 1445   BILIRUBINUR neg 04/15/2019 1418   KETONESUR NEGATIVE 03/21/2019 1420   PROTEINUR Positive (A) 04/15/2019 1418   PROTEINUR >=300 (A) 03/21/2019 1420   UROBILINOGEN 0.2 04/15/2019 1418   UROBILINOGEN 0.2 05/11/2017 1335   NITRITE neg 04/15/2019 1418   NITRITE NEGATIVE 03/21/2019 1420   LEUKOCYTESUR Negative 04/15/2019 1418   LEUKOCYTESUR NEGATIVE 03/21/2019 1420     ----------------------------------------------------------------------------------------------------------------   Imaging Results:    DG Chest 2 View  Result Date: 05/06/2019 CLINICAL DATA:  Chest pain, history of sickle cell EXAM: CHEST - 2 VIEW COMPARISON:  01/24/2019 FINDINGS: Check shadow is stable. The lungs are well aerated bilaterally. No focal infiltrate or sizable effusion is seen. No bony abnormality is noted. Scattered calcifications are noted within the lungs stable from the previous exam. No new focal abnormality is noted. Bony structures show no changes of avascular necrosis in the proximal right humerus. IMPRESSION: Chronic changes without acute abnormality. Electronically Signed   By: Inez Catalina M.D.   On: 05/06/2019 12:58      Assessment & Plan:  Principal Problem:   Sickle cell  pain crisis ([place]) Active Problems:   CKD (chronic kidney disease), stage IV (HCC)   Acute kidney injury superimposed on chronic kidney disease (HCC)   Chronic pain syndrome   History of cocaine use  Sickle cell disease with pain crisis: Admit.  Continue [facility] at [facility]. Weight-based Dilaudid PCA with settings of 0.5 mg, 10-minute lockout, and 3 mg/h. Hold IV Toradol due to stage IV chronic kidney disease. Continue home pain medications Monitor vital signs very closely.  Reevaluate pain scale regularly.  Patient will be reevaluated for pain in context of functioning in relationship to baseline as his care progresses.  Symptomatic anemia: Hemoglobin 4.4.  Transfuse 2 units PRBCs over 3 hours each.  Follow CBC in a.m.  Supplemental oxygen as needed.  Pancytopenia: Continue to monitor closely.  Repeat CBC in a.m.  Hold hydroxyurea  Stage IV chronic kidney disease: Creatinine 2.6.  Gentle hydration.  Avoid all nephrotoxic medications.  BMP in a.m.  Chronic pain syndrome: Continue home medications  History of cocaine abuse: Patient denies current illicit drug use.  Urine drug screen  pending.  Tobacco dependence: Patient counseled at length on the dangers of smoking.  Nicotine patches.  DVT Prophylaxis: SCDs  AM Labs Ordered, also please review Full Orders  Family Communication: Admission, patient's condition and plan of care including tests being ordered have been discussed with the patient who indicate understanding and agree with the plan and Code Status.  Code Status: Full Code  Consults called: None    Admission status: Inpatient    Time spent in minutes : 50 minutes  [place], MSN, FNP-C [facility] [address] [place], [place] [address] [phone]  05/06/2019 at 3:57 PM

## 2019-05-06 NOTE — ED Triage Notes (Signed)
Patient c/o mid chest pain and SOB since 0100 today. Patient also c/o sickle cell pain bilateral lower extremities.

## 2019-05-06 NOTE — ED Notes (Signed)
Patient requesting lab draw with an IV start.

## 2019-05-07 ENCOUNTER — Other Ambulatory Visit: Payer: Self-pay | Admitting: Family Medicine

## 2019-05-07 DIAGNOSIS — D61818 Other pancytopenia: Secondary | ICD-10-CM

## 2019-05-07 DIAGNOSIS — Z8669 Personal history of other diseases of the nervous system and sense organs: Secondary | ICD-10-CM

## 2019-05-07 DIAGNOSIS — G894 Chronic pain syndrome: Secondary | ICD-10-CM

## 2019-05-07 LAB — COMPREHENSIVE METABOLIC PANEL
ALT: 20 IU/L (ref 0–44)
AST: 23 IU/L (ref 0–40)
Albumin/Globulin Ratio: 1.3 (ref 1.2–2.2)
Albumin: 3.6 g/dL — ABNORMAL LOW (ref 4.0–5.0)
Alkaline Phosphatase: 357 IU/L — ABNORMAL HIGH (ref 39–117)
BUN/Creatinine Ratio: 13 (ref 9–20)
BUN: 33 mg/dL — ABNORMAL HIGH (ref 6–20)
Bilirubin Total: 0.6 mg/dL (ref 0.0–1.2)
CO2: 13 mmol/L — ABNORMAL LOW (ref 20–29)
Calcium: 8.7 mg/dL (ref 8.7–10.2)
Chloride: 105 mmol/L (ref 96–106)
Creatinine, Ser: 2.46 mg/dL — ABNORMAL HIGH (ref 0.76–1.27)
GFR calc Af Amer: 38 mL/min/{1.73_m2} — ABNORMAL LOW (ref >59)
GFR calc non Af Amer: 32 mL/min/{1.73_m2} — ABNORMAL LOW (ref >59)
Globulin, Total: 2.8 g/dL (ref 1.5–4.5)
Glucose: 142 mg/dL — ABNORMAL HIGH (ref 65–99)
Potassium: 4.2 mmol/L (ref 3.5–5.2)
Sodium: 136 mmol/L (ref 134–144)
Total Protein: 6.4 g/dL (ref 6.0–8.5)

## 2019-05-07 LAB — DRUG SCREEN 764883 11+OXYCO+ALC+CRT-BUND
Amphetamines, Urine: NEGATIVE ng/mL
BENZODIAZ UR QL: NEGATIVE ng/mL
Barbiturate: NEGATIVE ng/mL
Cocaine (Metabolite): NEGATIVE ng/mL
Creatinine: 52.2 mg/dL (ref 20.0–300.0)
Meperidine: NEGATIVE ng/mL
Methadone Screen, Urine: NEGATIVE ng/mL
OPIATE SCREEN URINE: NEGATIVE ng/mL
Oxycodone/Oxymorphone, Urine: NEGATIVE ng/mL
Phencyclidine: NEGATIVE ng/mL
Propoxyphene: NEGATIVE ng/mL
Tramadol: NEGATIVE ng/mL
pH, Urine: 5.9 (ref 4.5–8.9)

## 2019-05-07 LAB — CBC
HCT: 22.6 % — ABNORMAL LOW (ref 39.0–52.0)
Hemoglobin: 7.5 g/dL — ABNORMAL LOW (ref 13.0–17.0)
MCH: 28.7 pg (ref 26.0–34.0)
MCHC: 33.2 g/dL (ref 30.0–36.0)
MCV: 86.6 fL (ref 80.0–100.0)
Platelets: 53 10*3/uL — ABNORMAL LOW (ref 150–400)
RBC: 2.61 MIL/uL — ABNORMAL LOW (ref 4.22–5.81)
RDW: 19.5 % — ABNORMAL HIGH (ref 11.5–15.5)
WBC: 4.3 10*3/uL (ref 4.0–10.5)
nRBC: 0 % (ref 0.0–0.2)

## 2019-05-07 LAB — CBC WITH DIFFERENTIAL/PLATELET
Basophils Absolute: 0 10*3/uL (ref 0.0–0.2)
Basos: 0 %
EOS (ABSOLUTE): 0 10*3/uL (ref 0.0–0.4)
Eos: 1 %
Hematocrit: 14.3 % — CL (ref 37.5–51.0)
Hemoglobin: 4.8 g/dL — CL (ref 13.0–17.7)
Immature Grans (Abs): 0 10*3/uL (ref 0.0–0.1)
Immature Granulocytes: 0 %
Lymphocytes Absolute: 2.7 10*3/uL (ref 0.7–3.1)
Lymphs: 82 %
MCH: 27.7 pg (ref 26.6–33.0)
MCHC: 33.6 g/dL (ref 31.5–35.7)
MCV: 83 fL (ref 79–97)
Monocytes Absolute: 0.1 10*3/uL (ref 0.1–0.9)
Monocytes: 4 %
Neutrophils Absolute: 0.4 10*3/uL — CL (ref 1.4–7.0)
Neutrophils: 13 %
Platelets: 50 10*3/uL — CL (ref 150–450)
RBC: 1.73 x10E6/uL — CL (ref 4.14–5.80)
RDW: 18.6 % — ABNORMAL HIGH (ref 11.6–15.4)
WBC: 3.3 10*3/uL — ABNORMAL LOW (ref 3.4–10.8)

## 2019-05-07 LAB — CANNABINOID CONFIRMATION, UR
CANNABINOIDS: POSITIVE — AB
Carboxy THC GC/MS Conf: 215 ng/mL

## 2019-05-07 LAB — BASIC METABOLIC PANEL
Anion gap: 6 (ref 5–15)
BUN: 34 mg/dL — ABNORMAL HIGH (ref 6–20)
CO2: 19 mmol/L — ABNORMAL LOW (ref 22–32)
Calcium: 8.8 mg/dL — ABNORMAL LOW (ref 8.9–10.3)
Chloride: 108 mmol/L (ref 98–111)
Creatinine, Ser: 2.41 mg/dL — ABNORMAL HIGH (ref 0.61–1.24)
GFR calc Af Amer: 39 mL/min — ABNORMAL LOW (ref >60)
GFR calc non Af Amer: 33 mL/min — ABNORMAL LOW (ref >60)
Glucose, Bld: 97 mg/dL (ref 70–99)
Potassium: 4.6 mmol/L (ref 3.5–5.1)
Sodium: 133 mmol/L — ABNORMAL LOW (ref 135–145)

## 2019-05-07 LAB — PANEL 764016: Ethanol: 0.032 %

## 2019-05-07 MED ORDER — HYDROMORPHONE 1 MG/ML IV SOLN
INTRAVENOUS | Status: DC
  Administered 2019-05-07: 2.5 mg via INTRAVENOUS
  Administered 2019-05-07: 30 mg via INTRAVENOUS
  Administered 2019-05-08: 4.5 mg via INTRAVENOUS
  Filled 2019-05-07: qty 30

## 2019-05-07 MED ORDER — OXYCODONE HCL 5 MG PO TABS
10.0000 mg | ORAL_TABLET | ORAL | Status: DC | PRN
  Administered 2019-05-07 – 2019-05-08 (×5): 10 mg via ORAL
  Filled 2019-05-07 (×5): qty 2

## 2019-05-07 MED ORDER — ADULT MULTIVITAMIN W/MINERALS CH
1.0000 | ORAL_TABLET | Freq: Every day | ORAL | Status: DC
  Administered 2019-05-07 – 2019-05-08 (×2): 1 via ORAL
  Filled 2019-05-07 (×2): qty 1

## 2019-05-07 MED ORDER — DIPHENHYDRAMINE HCL 25 MG PO CAPS
25.0000 mg | ORAL_CAPSULE | ORAL | Status: DC | PRN
  Administered 2019-05-07 (×2): 25 mg via ORAL
  Filled 2019-05-07 (×2): qty 1

## 2019-05-07 NOTE — Progress Notes (Signed)
Initial Nutrition Assessment  DOCUMENTATION CODES:   Not applicable  INTERVENTION:  - will order Magic cup TID with meals, each supplement provides 290 kcal and 9 grams of protein - will order daily multivitamin with minerals.    NUTRITION DIAGNOSIS:   Increased nutrient needs related to acute illness as evidenced by estimated needs.  GOAL:   Patient will meet greater than or equal to 90% of their needs  MONITOR:   PO intake, Supplement acceptance, Labs, Weight trends  REASON FOR ASSESSMENT:   Malnutrition Screening Tool  ASSESSMENT:   39 y.o. male with a medical history of sickle cell disease, chronic pain syndrome, opiate dependence and tolerance, hx of cocaine abuse, stage 4 CKD due to immune complex glomerulonephritis, and hx of tobacco dependence. He presented to ED complaining of pain primarily to mid-chest and BLEs that has been worsening over the past several days. Pain is consistent with previous sickle cell pain crises. He attributed pain crisis to being out pain medications over the past several days.  Pain intensity is 10/10 characterized as constant and throbbing.  Patient also endorsed SOB and dizziness.  No intakes documented since admission. Patient denies any experience of headache with the dizziness he has been experiencing. He also denies any N/V related to 10/10 pain. He states that appetite is usually good but that he has a lack of desire to eat when pain is so severe/when he is having a sickle cell crisis.   He feels that he has lost weight in the past 1-2 weeks but is unsure of how much weight he may have lost. He weighed 119 lb yesterday and on 04/15/19 he weighed 122 lb. This indicates 3 lb weight loss (2.4% body weight) in the past 3 weeks; not significant for time frame. Weight was stable from 12/26/18-03/21/19. Weight on 03/21/19 was 130 lb. This indicates 11 lb weight loss (8.5% body weight) in the past ~2 months which is significant for time frame.   Per  notes: - sickle cell disease with pain crisis - symptomatic anemia - pancytopenia   Labs reviewed; Na: 133 mmol/l, BUN: 34 mg/dl, creatinine: 2.41 mg/dl, GFR: 39 ml/min. Medications reviewed; 1 mg folvite/day, 1 tablet senokot BID.  IVF; D5-1/2 NS @ 10 ml/hr (41 kcal).      NUTRITION - FOCUSED PHYSICAL EXAM:  completed; no muscle or fat wasting.   Diet Order:   Diet Order            Diet regular Room service appropriate? Yes; Fluid consistency: Thin  Diet effective now              EDUCATION NEEDS:   No education needs have been identified at this time  Skin:  Skin Assessment: Reviewed RN Assessment  Last BM:  PTA/unknown  Height:   Ht Readings from Last 1 Encounters:  05/06/19 5\' 3"  (1.6 m)    Weight:   Wt Readings from Last 1 Encounters:  05/06/19 54.1 kg    Ideal Body Weight:  56.4 kg  BMI:  Body mass index is 21.13 kg/m.  Estimated Nutritional Needs:   Kcal:  1650-1900 kcal  Protein:  80-90 grams  Fluid:  >/= 2 L/day     Jarome Matin, MS, RD, LDN, CNSC Inpatient Clinical Dietitian RD pager # available in [facility]  After hours/weekend pager # available in [facility]

## 2019-05-07 NOTE — Telephone Encounter (Signed)
Request for a 90 supply Rx Nortriptyline HCL 25 MG

## 2019-05-07 NOTE — Progress Notes (Signed)
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  On yesterday, patient's hemoglobin was 4.4.  He is s/p 2 units PRBCs overnight.  Patient states that shortness of breath and heart palpitations have resolved.  Hemoglobin has returned to baseline at 7.5.  Patient says that pain intensity is 7/10 primarily to lower extremities.  He currently denies headache, chest pain, shortness of breath, urinary symptoms, nausea, vomiting, or diarrhea.  Objective:  Vital signs in last 24 hours:  Vitals:   05/07/19 0211 05/07/19 0408 05/07/19 0452 05/07/19 0800  BP: 114/73  110/77   Pulse: 97  85   Resp: 11 13 14 12   Temp: (!) 97.5 F (36.4 C)  97.8 F (36.6 C)   TempSrc: Axillary  Oral   SpO2: 100% 100% 100% 100%  Weight:      Height:        Intake/Output from previous day:   Intake/Output Summary (Last 24 hours) at 05/07/2019 1203 Last data filed at 05/07/2019 0700 Gross per 24 hour  Intake 1286.17 ml  Output 990 ml  Net 296.17 ml    Physical Exam Constitutional:      Appearance: He is well-developed.  HENT:     Head: Normocephalic.  Eyes:     General: Scleral icterus present.     Pupils: Pupils are equal, round, and reactive to light.  Cardiovascular:     Rate and Rhythm: Normal rate and regular rhythm.     Heart sounds: Murmur present.  Pulmonary:     Effort: Pulmonary effort is normal.     Breath sounds: Normal breath sounds.  Abdominal:     General: Bowel sounds are normal.  Skin:    General: Skin is warm.  Neurological:     General: No focal deficit present.     Mental Status: He is alert.  Psychiatric:        Mood and Affect: Mood normal.        Behavior: Behavior normal.        Thought Content:  Thought content normal.        Judgment: Judgment normal.     Lab Results:  Basic Metabolic Panel:    Component Value Date/Time   NA 133 (L) 05/07/2019 0708   NA 136 05/06/2019 0953   K 4.6 05/07/2019 0708   CL 108 05/07/2019 0708   CO2 19 (L) 05/07/2019 0708   BUN 34 (H) 05/07/2019 0708   BUN 33 (H) 05/06/2019 0953   CREATININE 2.41 (H) 05/07/2019 0708   CREATININE 1.45 (H) 05/12/2014 1430   GLUCOSE 97 05/07/2019 0708   CALCIUM 8.8 (L) 05/07/2019 0708   CBC:    Component Value Date/Time   WBC 4.3 05/07/2019 0708   HGB 7.5 (L) 05/07/2019 0708   HGB 4.8 (LL) 05/06/2019 0953   HCT 22.6 (L) 05/07/2019 0708   HCT 14.3 (LL) 05/06/2019 0953   PLT 53 (L) 05/07/2019 0708   PLT 50 (LL) 05/06/2019 0953   MCV 86.6 05/07/2019 0708   MCV 83 05/06/2019 0953   NEUTROABS 0.4 (LL) 05/06/2019 0953   LYMPHSABS 2.7 05/06/2019 0953   MONOABS 1.3 (H) 03/21/2019 0811   EOSABS 0.0 05/06/2019 0953   BASOSABS 0.0 05/06/2019 0953    Recent Results (from the past 240 hour(s))  SARS CORONAVIRUS 2 (TAT 6-24 HRS) Nasopharyngeal Nasopharyngeal Swab     Status: None   Collection Time: 05/06/19  2:58 PM   Specimen: Nasopharyngeal Swab  Result Value Ref Range Status   SARS Coronavirus 2 NEGATIVE NEGATIVE Final    Comment: (NOTE) SARS-CoV-2 target nucleic acids are NOT DETECTED. The SARS-CoV-2 RNA is generally detectable in upper and lower respiratory specimens during the acute phase of infection. Negative results do not preclude SARS-CoV-2 infection, do not rule out co-infections with other pathogens, and should not be used as the sole basis for treatment or other patient management decisions. Negative results must be combined with clinical observations, patient history, and epidemiological information. The expected result is Negative. Fact Sheet for Patients: SugarRoll.be Fact Sheet for Healthcare Providers: https://www.woods-mathews.com/ This test  is not yet approved or cleared by the [place] FDA and  has been authorized for detection and/or diagnosis of SARS-CoV-2 by FDA under an Emergency Use Authorization (EUA). This EUA will remain  in effect (meaning this test can be used) for the duration of the COVID-19 declaration under Section 56 4(b)(1) of the Act, 21 U.S.C. section 360bbb-3(b)(1), unless the authorization is terminated or revoked sooner. Performed at [facility], [place] [address] [place], [place] [address]     Studies/Results: DG Chest 2 View  Result Date: 05/06/2019 CLINICAL DATA:  Chest pain, history of sickle cell EXAM: CHEST - 2 VIEW COMPARISON:  01/24/2019 FINDINGS: Check shadow is stable. The lungs are well aerated bilaterally. No focal infiltrate or sizable effusion is seen. No bony abnormality is noted. Scattered calcifications are noted within the lungs stable from the previous exam. No new focal abnormality is noted. Bony structures show no changes of avascular necrosis in the proximal right humerus. IMPRESSION: Chronic changes without acute abnormality. Electronically Signed   By: Inez Catalina M.D.   On: 05/06/2019 12:58    Medications: Scheduled Meds: . allopurinol  100 mg Oral Daily  . folic acid  1 mg Oral Daily  . HYDROmorphone   Intravenous Q4H  . oxyCODONE  15 mg Oral Q12H  . senna-docusate  1 tablet Oral BID   Continuous Infusions: . dextrose 5 % and 0.45% NaCl 10 mL/hr at 05/06/19 1726   PRN Meds:.diphenhydrAMINE, gabapentin, naloxone **AND** sodium chloride flush, ondansetron, oxyCODONE, polyethylene glycol  Consultants:  None  Procedures:  None  Antibiotics:  None  Assessment/Plan: Principal Problem:   Sickle cell pain crisis ([place]) Active Problems:   CKD (chronic kidney disease), stage IV (HCC)   Acute kidney injury superimposed on chronic kidney disease (HCC)   Chronic pain syndrome   History of cocaine use  Sickle cell disease with pain crisis: Continue IV  Dilaudid PCA, no changes in settings on today. Hold IV Toradol due to stage IV chronic kidney disease Continue home pain medications. Monitor vital signs closely, reevaluate pain scale regularly, supplemental oxygen as needed.  Symptomatic anemia: Patient states that shortness of breath, dizziness, and heart palpitations have resolved.  Hemoglobin has returned to baseline at 7.5.  Continue to follow closely.  CBC in a.m.  Pancytopenia: WBCs improved on today.  Platelets roughly the same.  Continue to follow closely.  Continue to hold hydroxyurea. Follow labs in am.   Stage 4 chronic kidney disease: Creatinine improved to 2.4 today.  Continue to avoid all nephrotoxic medications.  Gentle hydration.  BMP in a.m.  History of cocaine abuse: Reviewed urine drug screen, negative for cocaine.  Tobacco dependence: Patient counseled at length on  the dangers of tobacco use.  Code Status: Full Code Family Communication: N/A Disposition Plan: Not yet ready for discharge  [place], MSN, FNP-C [facility] [address] [place], [place] [address] [phone]  If 5PM-8AM, please contact night-coverage.  05/07/2019, 12:03 PM  LOS: 1 day

## 2019-05-08 DIAGNOSIS — D571 Sickle-cell disease without crisis: Secondary | ICD-10-CM

## 2019-05-08 LAB — CBC
HCT: 23.3 % — ABNORMAL LOW (ref 39.0–52.0)
Hemoglobin: 7.6 g/dL — ABNORMAL LOW (ref 13.0–17.0)
MCH: 28.6 pg (ref 26.0–34.0)
MCHC: 32.6 g/dL (ref 30.0–36.0)
MCV: 87.6 fL (ref 80.0–100.0)
Platelets: 68 10*3/uL — ABNORMAL LOW (ref 150–400)
RBC: 2.66 MIL/uL — ABNORMAL LOW (ref 4.22–5.81)
RDW: 19.9 % — ABNORMAL HIGH (ref 11.5–15.5)
WBC: 3.8 10*3/uL — ABNORMAL LOW (ref 4.0–10.5)
nRBC: 0 % (ref 0.0–0.2)

## 2019-05-08 LAB — TYPE AND SCREEN
ABO/RH(D): O POS
Antibody Screen: NEGATIVE
Donor AG Type: NEGATIVE
Donor AG Type: NEGATIVE
Unit division: 0
Unit division: 0

## 2019-05-08 LAB — BPAM RBC
Blood Product Expiration Date: 202104052359
Blood Product Expiration Date: 202104052359
ISSUE DATE / TIME: 202103232234
ISSUE DATE / TIME: 202103240144
Unit Type and Rh: 9500
Unit Type and Rh: 9500

## 2019-05-08 LAB — LACTATE DEHYDROGENASE: LDH: 86 U/L — ABNORMAL LOW (ref 98–192)

## 2019-05-08 MED ORDER — OXYCODONE HCL 10 MG PO TABS: 10.0000 mg | ORAL_TABLET | Freq: Four times a day (QID) | ORAL | 0 refills | Status: DC | PRN

## 2019-05-08 MED ORDER — GABAPENTIN 300 MG PO CAPS: 300.0000 mg | ORAL_CAPSULE | Freq: Two times a day (BID) | ORAL | 1 refills | Status: DC | PRN

## 2019-05-08 MED ORDER — OXYCODONE HCL ER 15 MG PO T12A: 15.0000 mg | EXTENDED_RELEASE_TABLET | Freq: Two times a day (BID) | ORAL | 0 refills | Status: DC

## 2019-05-08 NOTE — Discharge Summary (Signed)
Physician Discharge Summary  [name] [name]. POE:[id number] DOB: 04-05-1980 DOA: 05/06/2019  PCP: [name], MD  Admit date: 05/06/2019  Discharge date: 05/08/2019  Discharge Diagnoses:  Principal Problem:   Sickle cell pain crisis ([place]) Active Problems:   CKD (chronic kidney disease), stage IV (HCC)   Acute kidney injury superimposed on chronic kidney disease (HCC)   Chronic pain syndrome   History of cocaine use   Pancytopenia, acquired ([place])   Discharge Condition: Stable  Disposition:  Follow-up Information    [name], MD Follow up in 1 week(s).   Specialty: Internal Medicine Why: Lab appointment  Contact information: [place] [address] [phone]        Buford Dresser, MD .   Specialty: Cardiology Contact information: [address] [place] [address] [phone]          Pt is discharged home in good condition and is to follow up with [name], MD this week to have labs evaluated. [name] A Martinezlopez Jr. 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  Pain is consistent with patient's previous sickle cell pain crises.  Patient attributes pain crisis to being out of pain medications over the past several days.   Pain intensity is 10/10 characterized as constant and throbbing.  Patient also endorses shortness of breath and dizziness.  He denies headache, paresthesias, urinary symptoms, nausea, vomiting, or diarrhea.  Patient has no sick contacts, recent travel, or exposure to COVID-19 infection  ER course: Patient found to have temperature of 98.7 F, BP 115/71, P106, RR 16, oxygen saturation 99% on RA.  Today, patient has pancytopenia WBCs 3.8, hemoglobin 4.4, and platelets 52.  Patient's creatinine is 2.6, which is increased from baseline of 2.1-2.2.  Patient's pain persists despite IV Dilaudid and IV fluids.  Patient admitted to telemetry for management of sickle cell pain crisis and symptomatic anemia.  Hospital Course:  Sickle cell anemia with pain crisis: Patient admitted with uncontrolled pain that was consistent with his previous sickle cell crisis.  He was treated with IV Dilaudid PCA, which was weaned appropriately.  Toradol he will, contraindicated due to chronic kidney disease stage IV.  OxyContin continued without interruption.  Patient transition to oxycodone every 4 hours will resume medication regimen upon returning home.  OxyContin 15 mg every 12 hours #60 and oxycodone 10 mg every 6 hours #60 sent to patient's pharmacy.  PDMP reviewed prior to prescribing pain medication, no inconsistencies noted.  Patient underwent urine drug screen, positive for marijuana, no cocaine present.  Patient counseled at length.  He says that he is continuing to refrain from cocaine use.  Patient declined rehabilitation. Patient continues to smoke tobacco several times a day.  Counseled on the dangers of smoking with sickle cell disease.  Patient expressed understanding.  He is not ready to quit at this time.  On admission patient was found to have a hemoglobin of 4.4 which is below his baseline of 7.0-8.0 g/dL.  Patient transfused 2 units PRBCs, hemoglobin returned to baseline at 7.6.  Patient advised to follow-up  next week to repeat labs.  Also, platelets improved to around 68,000.  Patient asked to continue to hold hydroxyurea for the time being.  Will recheck platelets in 1 week if platelets are above 80,000, will resume hydroxyurea.  Chronic kidney disease stage IV due to immune complex glomerulonephritis.  Creatinine improved to baseline.  He is followed by nephrology, patient advised to follow-up as scheduled.  Patient's pain intensity is 5/10.  He says that he can manage at home on current medication regimen and is requesting discharge.  He is alert, oriented, and ambulating without assistance.  Patient was discharged home today in a hemodynamically stable condition.   Discharge Exam: Vitals:   05/08/19 1221 05/08/19 1223  BP:  127/78  Pulse:  96  Resp: 13 18  Temp:  98.5 F (36.9 C)  SpO2: 98% 97%   Vitals:   05/08/19 0429 05/08/19 0713 05/08/19 1221 05/08/19 1223  BP: 117/80   127/78  Pulse: 93   96  Resp: 13 11 13 18   Temp:    98.5 F (36.9 C)  TempSrc:    Oral  SpO2: 99% 96% 98% 97%  Weight:      Height:       Physical Exam Constitutional:      Appearance: Normal appearance. He is well-developed.  HENT:     Mouth/Throat:     Mouth: Mucous membranes are moist.     Pharynx: Oropharynx is clear.  Eyes:     Pupils: Pupils are equal, round, and reactive to light.  Cardiovascular:     Rate and Rhythm: Normal rate and regular rhythm.     Pulses: Normal pulses.     Heart sounds: Murmur present.  Pulmonary:     Effort: Pulmonary effort is normal.     Breath sounds: No decreased breath sounds.  Abdominal:     General: Bowel sounds are normal.     Palpations: Abdomen is soft.  Musculoskeletal:        General: Normal range of motion.  Skin:    General: Skin is warm.  Neurological:     Mental Status: He is alert. He is disoriented.  Psychiatric:        Mood and Affect: Mood normal.    Discharge Instructions  Discharge Instructions    Discharge patient   Complete by:  As directed    Discharge disposition: 01-Home or Self Care   Discharge patient date: 05/08/2019     Allergies as of 05/08/2019      Reactions   Nsaids Other (See Comments)   Kidney disease   Morphine And Related Other (See Comments)   "Real bad headaches"       Medication List    TAKE these medications   acetaminophen 500 MG tablet Commonly known as: TYLENOL Take 1 tablet (500 mg total) by mouth every 6 (six) hours as needed. What changed: reasons to take this   folic acid 1 MG tablet Commonly known as: FOLVITE Take 1 tablet (1 mg total) by mouth daily. Notes to patient: 05/09/2019    gabapentin 300 MG capsule Commonly known as: NEURONTIN Take 1 capsule (300 mg total) by mouth 2 (two) times daily as needed.   hydroxyurea 500 MG  capsule Commonly known as: HYDREA TAKE 3 CAPSULES BY MOUTH EVERY DAY What changed:   how much to take  how to take this  when to take this  additional instructions Notes to patient: 05/08/2019    ibuprofen 200 MG tablet Commonly known as: ADVIL Take 200 mg by mouth every 6 (six) hours as needed for moderate pain.   nortriptyline 25 MG capsule Commonly known as: PAMELOR Take 1 capsule (25 mg total) by mouth 2 (two) times daily. Notes to patient: 05/08/2019    oxyCODONE 15 mg 12 hr tablet Commonly known as: OxyCONTIN Take 1 tablet (15 mg total) by mouth every 12 (twelve) hours.   Oxycodone HCl 10 MG Tabs Take 1 tablet (10 mg total) by mouth every 6 (six) hours as needed. Notes to patient: Around 6 pm.    sodium bicarbonate 650 MG tablet Take 650 mg by mouth 2 (two) times daily. Notes to patient: 05/08/2019 evening    Vitamin D3 50 MCG (2000 UT) Tabs Take 2 tablets by mouth daily. Notes to patient: 05/08/2019        The results of significant diagnostics from this hospitalization (including imaging, microbiology, ancillary and laboratory) are listed below for reference.    Significant Diagnostic Studies: DG Chest 2  View  Result Date: 05/06/2019 CLINICAL DATA:  Chest pain, history of sickle cell EXAM: CHEST - 2 VIEW COMPARISON:  01/24/2019 FINDINGS: Check shadow is stable. The lungs are well aerated bilaterally. No focal infiltrate or sizable effusion is seen. No bony abnormality is noted. Scattered calcifications are noted within the lungs stable from the previous exam. No new focal abnormality is noted. Bony structures show no changes of avascular necrosis in the proximal right humerus. IMPRESSION: Chronic changes without acute abnormality. Electronically Signed   By: Inez Catalina M.D.   On: 05/06/2019 12:58    Microbiology: Recent Results (from the past 240 hour(s))  SARS CORONAVIRUS 2 (TAT 6-24 HRS) Nasopharyngeal Nasopharyngeal Swab     Status: None   Collection Time: 05/06/19  2:58 PM   Specimen: Nasopharyngeal Swab  Result Value Ref Range Status   SARS Coronavirus 2 NEGATIVE NEGATIVE Final    Comment: (NOTE) SARS-CoV-2 target nucleic acids are NOT DETECTED. The SARS-CoV-2 RNA is generally detectable in upper and lower respiratory specimens during the acute phase of infection. Negative results do not preclude SARS-CoV-2 infection, do not rule out co-infections with other pathogens, and should not be used as the sole basis for treatment or other patient management decisions. Negative results must be combined with clinical observations, patient history, and epidemiological information. The expected result is Negative. Fact Sheet for Patients: SugarRoll.be Fact Sheet for Healthcare Providers: https://www.woods-mathews.com/ This test is not yet approved or cleared by the [place] FDA and  has been authorized for detection and/or diagnosis of SARS-CoV-2 by FDA under an Emergency Use Authorization (EUA). This EUA will remain  in effect (meaning this test can be used) for the duration of the COVID-19 declaration under Section 56 4(b)(1) of the Act, 21  U.S.C. section 360bbb-3(b)(1), unless the authorization is terminated or revoked sooner. Performed at [facility], [place] [address], [place], [place] [address]      Labs: Basic Metabolic Panel: Recent Labs  Lab 05/06/19 [phone] 05/06/19 0953 05/06/19 1355 05/07/19 0708  NA 136  --  135 133*  K 4.2   < > 4.3 4.6  CL 105  --  106 108  CO2 13*  --  19* 19*  GLUCOSE  142*  --  107* 97  BUN 33*  --  37* 34*  CREATININE 2.46*  --  2.60* 2.41*  CALCIUM 8.7  --  8.6* 8.8*   < > = values in this interval not displayed.   Liver Function Tests: Recent Labs  Lab 05/06/19 0953 05/06/19 1355  AST 23 21  ALT 20 21  ALKPHOS 357* 269*  BILITOT 0.6 0.8  PROT 6.4 7.1  ALBUMIN 3.6* 3.0*   No results for input(s): LIPASE, AMYLASE in the last 168 hours. No results for input(s): AMMONIA in the last 168 hours. CBC: Recent Labs  Lab 05/06/19 0953 05/06/19 1355 05/07/19 0708 05/08/19 0457  WBC 3.3* 3.8* 4.3 3.8*  NEUTROABS 0.4*  --   --   --   HGB 4.8* 4.4* 7.5* 7.6*  HCT 14.3* 14.0* 22.6* 23.3*  MCV 83 89.7 86.6 87.6  PLT 50* 52* 53* 68*   Cardiac Enzymes: No results for input(s): CKTOTAL, CKMB, CKMBINDEX, TROPONINI in the last 168 hours. BNP: Invalid input(s): POCBNP CBG: No results for input(s): GLUCAP in the last 168 hours.  Time coordinating discharge: 40 minutes  Signed:  [name]  APRN, MSN, FNP-C [facility] [address] [place], [place] [address] [phone]' [facility] 05/08/2019, 11:30 PM

## 2019-05-08 NOTE — Plan of Care (Signed)
Discharge instructions reviewed with patient, questions answered, verbalized understanding.  Patient transported to main entrance via wheelchair to be picked up by friend.

## 2019-05-08 NOTE — Discharge Instructions (Signed)
Sickle Cell Anemia, Adult  Sickle cell anemia is a condition where your red blood cells are shaped like sickles. Red blood cells carry oxygen through the body. Sickle-shaped cells do not live as long as normal red blood cells. They also clump together and block blood from flowing through the blood vessels. This prevents the body from getting enough oxygen. Sickle cell anemia causes organ damage and pain. It also increases the risk of infection. Follow these instructions at home: Medicines  Take over-the-counter and prescription medicines only as told by your doctor.  If you were prescribed an antibiotic medicine, take it as told by your doctor. Do not stop taking the antibiotic even if you start to feel better.  If you develop a fever, do not take medicines to lower the fever right away. Tell your doctor about the fever. Managing pain, stiffness, and swelling  Try these methods to help with pain: ? Use a heating pad. ? Take a warm bath. ? Distract yourself, such as by watching TV. Eating and drinking  Drink enough fluid to keep your pee (urine) clear or pale yellow. Drink more in hot weather and during exercise.  Limit or avoid alcohol.  Eat a healthy diet. Eat plenty of fruits, vegetables, whole grains, and lean protein.  Take vitamins and supplements as told by your doctor. Traveling  When traveling, keep these with you: ? Your medical information. ? The names of your doctors. ? Your medicines.  If you need to take an airplane, talk to your doctor first. Activity  Rest often.  Avoid exercises that make your heart beat much faster, such as jogging. General instructions  Do not use products that have nicotine or tobacco, such as cigarettes and e-cigarettes. If you need help quitting, ask your doctor.  Consider wearing a medical alert bracelet.  Avoid being in high places (high altitudes), such as mountains.  Avoid very hot or cold temperatures.  Avoid places where the  temperature changes a lot.  Keep all follow-up visits as told by your doctor. This is important. Contact a doctor if:  A joint hurts.  Your feet or hands hurt or swell.  You feel tired (fatigued). Get help right away if:  You have symptoms of infection. These include: ? Fever. ? Chills. ? Being very tired. ? Irritability. ? Poor eating. ? Throwing up (vomiting).  You feel dizzy or faint.  You have new stomach pain, especially on the left side.  You have a an erection (priapism) that lasts more than 4 hours.  You have numbness in your arms or legs.  You have a hard time moving your arms or legs.  You have trouble talking.  You have pain that does not go away when you take medicine.  You are short of breath.  You are breathing fast.  You have a long-term cough.  You have pain in your chest.  You have a bad headache.  You have a stiff neck.  Your stomach looks bloated even though you did not eat much.  Your skin is pale.  You suddenly cannot see well. Summary  Sickle cell anemia is a condition where your red blood cells are shaped like sickles.  Follow your doctor's advice on ways to manage pain, food to eat, activities to do, and steps to take for safe travel.  Get medical help right away if you have any signs of infection, such as a fever. This information is not intended to replace advice given to you by   your health care provider. Make sure you discuss any questions you have with your health care provider. Document Revised: 05/24/2018 Document Reviewed: 03/07/2016 Elsevier Patient Education  2020 [employer]  

## 2019-05-08 NOTE — Telephone Encounter (Signed)
Done

## 2019-05-11 ENCOUNTER — Other Ambulatory Visit: Payer: Self-pay | Admitting: Family Medicine

## 2019-05-13 ENCOUNTER — Ambulatory Visit (HOSPITAL_COMMUNITY)
Admission: RE | Admit: 2019-05-13 | Discharge: 2019-05-13 | Disposition: A | Discharge status: Home / Self Care | Payer: Medicare Other | Source: Ambulatory Visit | Attending: Internal Medicine | Admitting: Internal Medicine

## 2019-05-13 ENCOUNTER — Ambulatory Visit: Payer: Medicare Other | Admitting: Family Medicine

## 2019-05-13 ENCOUNTER — Other Ambulatory Visit: Payer: Self-pay

## 2019-05-13 DIAGNOSIS — D5700 Hb-SS disease with crisis, unspecified: Secondary | ICD-10-CM | POA: Diagnosis not present

## 2019-05-13 DIAGNOSIS — Z01812 Encounter for preprocedural laboratory examination: Secondary | ICD-10-CM | POA: Diagnosis not present

## 2019-05-13 LAB — CBC WITH DIFFERENTIAL/PLATELET
Abs Immature Granulocytes: 0.02 10*3/uL (ref 0.00–0.07)
Basophils Absolute: 0 10*3/uL (ref 0.0–0.1)
Basophils Relative: 0 %
Eosinophils Absolute: 0 10*3/uL (ref 0.0–0.5)
Eosinophils Relative: 0 %
HCT: 23.4 % — ABNORMAL LOW (ref 39.0–52.0)
Hemoglobin: 7.2 g/dL — ABNORMAL LOW (ref 13.0–17.0)
Immature Granulocytes: 0 %
Lymphocytes Relative: 50 %
Lymphs Abs: 2.9 10*3/uL (ref 0.7–4.0)
MCH: 27.9 pg (ref 26.0–34.0)
MCHC: 30.8 g/dL (ref 30.0–36.0)
MCV: 90.7 fL (ref 80.0–100.0)
Monocytes Absolute: 0.4 10*3/uL (ref 0.1–1.0)
Monocytes Relative: 7 %
Neutro Abs: 2.5 10*3/uL (ref 1.7–7.7)
Neutrophils Relative %: 43 %
Platelets: 247 10*3/uL (ref 150–400)
RBC: 2.58 MIL/uL — ABNORMAL LOW (ref 4.22–5.81)
RDW: 19.7 % — ABNORMAL HIGH (ref 11.5–15.5)
WBC: 5.8 10*3/uL (ref 4.0–10.5)
nRBC: 0.3 % — ABNORMAL HIGH (ref 0.0–0.2)

## 2019-05-13 LAB — TYPE AND SCREEN
ABO/RH(D): O POS
Antibody Screen: NEGATIVE

## 2019-05-13 LAB — HUMAN PARVOVIRUS DNA DETECTION BY PCR: Parvovirus B19, PCR: NEGATIVE

## 2019-05-13 NOTE — Progress Notes (Signed)
PATIENT CARE CENTER NOTE  Diagnosis: Sickle Cell Anemia    Provider: [place], [place], FNP   Procedure: Lab check (CBC and Type & Screen)   Note: Patient came for lab recheck after receiving blood transfusion last week. CBC and Type & Screen drawn. Patient's Hemoglobin 7.2. Provider on call, Dr. Doreene Burke, notified. No further interventions needed at this time. Discharge instructions given to patient. Patient alert, oriented and ambulatory at discharge.

## 2019-05-13 NOTE — Discharge Instructions (Signed)
Follow up labs. Hemoglobin 7.2.

## 2019-05-20 DIAGNOSIS — D649 Anemia, unspecified: Secondary | ICD-10-CM | POA: Diagnosis not present

## 2019-05-20 DIAGNOSIS — D571 Sickle-cell disease without crisis: Secondary | ICD-10-CM | POA: Diagnosis not present

## 2019-05-20 DIAGNOSIS — Z79899 Other long term (current) drug therapy: Secondary | ICD-10-CM | POA: Diagnosis not present

## 2019-05-20 DIAGNOSIS — E538 Deficiency of other specified B group vitamins: Secondary | ICD-10-CM | POA: Diagnosis not present

## 2019-05-20 DIAGNOSIS — R701 Abnormal plasma viscosity: Secondary | ICD-10-CM | POA: Diagnosis not present

## 2019-05-20 DIAGNOSIS — N08 Glomerular disorders in diseases classified elsewhere: Secondary | ICD-10-CM | POA: Diagnosis not present

## 2019-05-29 DIAGNOSIS — Z23 Encounter for immunization: Secondary | ICD-10-CM | POA: Diagnosis not present

## 2019-06-03 ENCOUNTER — Other Ambulatory Visit: Payer: Self-pay

## 2019-06-03 ENCOUNTER — Encounter: Payer: Self-pay | Admitting: Family Medicine

## 2019-06-03 ENCOUNTER — Ambulatory Visit (INDEPENDENT_AMBULATORY_CARE_PROVIDER_SITE_OTHER): Payer: Medicare Other | Admitting: Family Medicine

## 2019-06-03 VITALS — BP 123/76 | HR 124 | Temp 98.9°F | Resp 16 | Ht 63.0 in | Wt 122.0 lb

## 2019-06-03 DIAGNOSIS — D571 Sickle-cell disease without crisis: Secondary | ICD-10-CM

## 2019-06-03 DIAGNOSIS — G894 Chronic pain syndrome: Secondary | ICD-10-CM | POA: Diagnosis not present

## 2019-06-03 LAB — POCT URINALYSIS DIPSTICK
Bilirubin, UA: NEGATIVE
Blood, UA: NEGATIVE
Glucose, UA: POSITIVE — AB
Ketones, UA: NEGATIVE
Leukocytes, UA: NEGATIVE
Nitrite, UA: NEGATIVE
Protein, UA: POSITIVE — AB
Spec Grav, UA: 1.02 (ref 1.010–1.025)
Urobilinogen, UA: 0.2 E.U./dL
pH, UA: 7 (ref 5.0–8.0)

## 2019-06-03 MED ORDER — OXYCODONE HCL 10 MG PO TABS: 10.0000 mg | ORAL_TABLET | Freq: Four times a day (QID) | ORAL | 0 refills | Status: DC | PRN

## 2019-06-03 MED ORDER — OXYCODONE HCL ER 15 MG PO T12A: 15.0000 mg | EXTENDED_RELEASE_TABLET | Freq: Two times a day (BID) | ORAL | 0 refills | Status: DC

## 2019-06-03 NOTE — Patient Instructions (Addendum)
Please contact [facility] fax number, [phone]  Epoetin Alfa injection What is this medicine? EPOETIN ALFA (e POE e tin AL fa) helps your body make more red blood cells. This medicine is used to treat anemia caused by chronic kidney disease, cancer chemotherapy, or HIV-therapy. It may also be used before surgery if you have anemia. This medicine may be used for other purposes; ask your health care provider or pharmacist if you have questions. COMMON BRAND NAME(S): Epogen, Procrit, Retacrit What should I tell my health care provider before I take this medicine? They need to know if you have any of these conditions:  cancer  heart disease  high blood pressure  history of blood clots  history of stroke  low levels of folate, iron, or vitamin B12 in the blood  seizures  an unusual or allergic reaction to erythropoietin, albumin, benzyl alcohol, hamster proteins, other medicines, foods, dyes, or preservatives  pregnant or trying to get pregnant  breast-feeding How should I use this medicine? This medicine is for injection into a vein or under the skin. It is usually given by a health care professional in a hospital or clinic setting. If you get this medicine at home, you will be taught how to prepare and give this medicine. Use exactly as directed. Take your medicine at regular intervals. Do not take your medicine more often than directed. It is important that you put your used needles and syringes in a special sharps container. Do not put them in a trash can. If you do not have a sharps container, call your pharmacist or healthcare provider to get one. A special MedGuide will be given to you by the pharmacist with each prescription and refill. Be sure to read this information carefully each time. Talk to your pediatrician regarding the use of this medicine in children. While this drug may be prescribed for selected conditions, precautions do apply. Overdosage: If you  think you have taken too much of this medicine contact a poison control center or emergency room at once. NOTE: This medicine is only for you. Do not share this medicine with others. What if I miss a dose? If you miss a dose, take it as soon as you can. If it is almost time for your next dose, take only that dose. Do not take double or extra doses. What may interact with this medicine? Interactions have not been studied. This list may not describe all possible interactions. Give your health care provider a list of all the medicines, herbs, non-prescription drugs, or dietary supplements you use. Also tell them if you smoke, drink alcohol, or use illegal drugs. Some items may interact with your medicine. What should I watch for while using this medicine? Your condition will be monitored carefully while you are receiving this medicine. You may need blood work done while you are taking this medicine. This medicine may cause a decrease in vitamin B6. You should make sure that you get enough vitamin B6 while you are taking this medicine. Discuss the foods you eat and the vitamins you take with your health care professional. What side effects may I notice from receiving this medicine? Side effects that you should report to your doctor or health care professional as soon as possible:  allergic reactions like skin rash, itching or hives, swelling of the face, lips, or tongue  seizures  signs and symptoms of a blood clot such as breathing problems; changes in vision; chest pain; severe, sudden headache; pain,  swelling, warmth in the leg; trouble speaking; sudden numbness or weakness of the face, arm or leg  signs and symptoms of a stroke like changes in vision; confusion; trouble speaking or understanding; severe headaches; sudden numbness or weakness of the face, arm or leg; trouble walking; dizziness; loss of balance or coordination Side effects that usually do not require medical attention (report to  your doctor or health care professional if they continue or are bothersome):  chills  cough  dizziness  fever  headaches  joint pain  muscle cramps  muscle pain  nausea, vomiting  pain, redness, or irritation at site where injected This list may not describe all possible side effects. Call your doctor for medical advice about side effects. You may report side effects to FDA at 1-800-FDA-1088. Where should I keep my medicine? Keep out of the reach of children. Store in a refrigerator between 2 and 8 degrees C (36 and 46 degrees F). Do not freeze or shake. Throw away any unused portion if using a single-dose vial. Multi-dose vials can be kept in the refrigerator for up to 21 days after the initial dose. Throw away unused medicine. NOTE: This sheet is a summary. It may not cover all possible information. If you have questions about this medicine, talk to your doctor, pharmacist, or health care provider.  2020 Elsevier/Gold Standard (2016-09-08 08:35:19)

## 2019-06-03 NOTE — Progress Notes (Signed)
[facility]  Established Patient Office Visit  Subjective:  Patient ID: [name] [name]., male    DOB: 06-19-1980  Age: 39 y.o. MRN: [id number]  CC:  Chief Complaint  Patient presents with  . Sickle Cell Anemia    HPI [name] [name]. a 39 year old male with a medical history significant for sickle cell disease, chronic pain syndrome, opiate dependence and tolerance, chronic kidney disease, history of polysubstance abuse, and history of anemia of chronic disease presents for follow-up of chronic conditions and medication management. Patient says that he has been doing well and is without complaint.  His pain is controlled on current medication regimen.  Pain is primarily to lower extremities.  He last had oxycodone this a.m. with maximum relief.  Patient states that he is followed by [facility] hematology.  He says that hydroxyurea was recently placed on hold due to starting darbepoetin injections.  He says that pharmacy was unable to fill prescription for darbepoetin.  He says that he is attempted to contact his hematology provider.  He says that he restarted hydroxyurea being that he could not get that prescription. Patient denies headache, chest pain, shortness of breath, urinary symptoms, nausea, vomiting, or diarrhea.  Past Medical History:  Diagnosis Date  . Abscess of right iliac fossa 09/24/2018   required Perc Drain 09/24/2018  . Arachnoid Cyst of brain bilaterally    "2 really small ones in the back of my head; inside; saw them w/MRI" (09/25/2012)  . Bacterial pneumonia ~ 2012   "caught it here in the hospital" (09/25/2012)  . Chronic kidney disease    "from my sickle cell" (09/25/2012)  . CKD (chronic kidney disease), stage II   . Colitis 04/19/2017   CT scan abd/ pelvis  . GERD (gastroesophageal reflux disease)    "after I eat alot of spicey foods" (09/25/2012)  . Gynecomastia, male 07/10/2012  . History of blood  transfusion    "always related to sickle cell crisis" (09/25/2012)  . 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   Noted in noted from Hematology [facility] at [facility]  . Migraines    "take RX qd to prevent them" (09/25/2012)  . Opioid dependence with withdrawal ([place]) 08/30/2012  . Renal insufficiency   . Sickle cell anemia (HCC)   . Sickle cell crisis ([place]) 09/25/2012  . Sickle cell nephropathy ([place]) 07/10/2012  . Sinus tachycardia   . Tachycardia with heart rate 121-140 beats per minute with ambulation 08/04/2016    Past Surgical History:  Procedure Laterality Date  . ABCESS DRAINAGE    . CHOLECYSTECTOMY  ~ 2012  . COLONOSCOPY N/A 04/23/2017   Procedure: COLONOSCOPY;  Surgeon: Irene Shipper, MD;  Location: [facility];  Service: Endoscopy;  Laterality: N/A;  . IR FLUORO GUIDE CV LINE RIGHT  12/17/2016  . IR RADIOLOGIST EVAL & MGMT  10/02/2018  . IR RADIOLOGIST EVAL & MGMT  10/15/2018  . IR REMOVAL TUN CV CATH W/O FL  12/21/2016  . IR US GUIDE VASC ACCESS RIGHT  12/17/2016  . spleenectomy      Family History  Problem Relation Age of Onset  . Breast cancer Mother     Social History   Socioeconomic History  . Marital status: Single    Spouse name: Not on file  . Number of children: Not on file  . Years of education: Not on file  . Highest education level: Not on file  Occupational History  . Not  on file  Tobacco Use  . Smoking status: Current Some Day Smoker    Packs/day: 0.10    Types: Cigarettes  . Smokeless tobacco: Never Used  . Tobacco comment: 09/25/2012 "I don't buy cigarettes; bum one from friends q now and then"  Substance and Sexual Activity  . Alcohol use: Yes    Alcohol/week: 0.0 standard drinks    Comment: Once a month  . Drug use: Yes    Types: Marijuana    Comment: Every 2-3 weeks   . Sexual activity: Yes  Other Topics Concern  . Not on file  Social History Narrative    Lives alone in [place].   Back at school, studying Public relations account executive.  Unemployed.    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    Social Determinants of Health   Financial Resource Strain:   . Difficulty of Paying Living Expenses:   Food Insecurity:   . Worried About Charity fundraiser in the Last Year:   . Arboriculturist in the Last Year:   Transportation Needs:   . Film/video editor (Medical):   Marland Kitchen Lack of Transportation (Non-Medical):   Physical Activity:   . Days of Exercise per Week:   . Minutes of Exercise per Session:   Stress:   . Feeling of Stress :   Social Connections:   . Frequency of Communication with Friends and Family:   . Frequency of Social Gatherings with Friends and Family:   . Attends Religious Services:   . Active Member of Clubs or Organizations:   . Attends Archivist Meetings:   Marland Kitchen Marital Status:   Intimate Partner Violence:   . Fear of Current or Ex-Partner:   . Emotionally Abused:   Marland Kitchen Physically Abused:   . Sexually Abused:     Outpatient Medications Prior to Visit  Medication Sig Dispense Refill  . acetaminophen (TYLENOL) 500 MG tablet Take 1 tablet (500 mg total) by mouth every 6 (six) hours as needed. (Patient taking differently: Take 500 mg by mouth every 6 (six) hours as needed for moderate pain. ) 30 tablet 0  . Cholecalciferol (VITAMIN D3) 50 MCG (2000 UT) TABS Take 2 tablets by mouth daily.     . folic acid (FOLVITE) 1 MG tablet Take 1 tablet (1 mg total) by mouth daily. 30 tablet 12  . gabapentin (NEURONTIN) 300 MG capsule Take 1 capsule (300 mg total) by mouth 2 (two) times daily as needed. 60 capsule 1  . hydroxyurea (HYDREA) 500 MG capsule TAKE 3 CAPSULES BY MOUTH EVERY DAY (Patient taking differently: Take 1,500 mg by mouth daily. ) 270 capsule 1  . ibuprofen (ADVIL) 200 MG tablet Take 200 mg by mouth every 6 (six) hours as needed for moderate pain.    .  nortriptyline (PAMELOR) 25 MG capsule Take 1 capsule (25 mg total) by mouth 2 (two) times daily. 90 capsule 3  . oxyCODONE (OXYCONTIN) 15 mg 12 hr tablet Take 1 tablet (15 mg total) by mouth every 12 (twelve) hours. 60 tablet 0  . Oxycodone HCl 10 MG TABS Take 1 tablet (10 mg total) by mouth every 6 (six) hours as needed. 60 tablet 0  . sodium bicarbonate 650 MG tablet Take 650 mg by mouth 2 (two) times daily.     No facility-administered medications prior to  visit.    Allergies  Allergen Reactions  . Nsaids Other (See Comments)    Kidney disease  . Morphine And Related Other (See Comments)    "Real bad headaches"     ROS Review of Systems  Constitutional: Negative.  Negative for fatigue and fever.  HENT: Negative.   Respiratory: Negative.   Cardiovascular: Negative.   Gastrointestinal: Negative.   Endocrine: Negative.   Genitourinary: Negative.   Musculoskeletal: Positive for arthralgias and back pain.  Skin: Negative.       Objective:    Physical Exam  Constitutional: He is oriented to person, place, and time. He appears well-developed and well-nourished.  HENT:  Head: Normocephalic.  Cardiovascular: Regular rhythm. Tachycardia present.  Murmur heard. Pulmonary/Chest: Effort normal and breath sounds normal.  Abdominal: Soft. Bowel sounds are normal.  Musculoskeletal:     Cervical back: Normal range of motion.  Neurological: He is alert and oriented to person, place, and time.  Skin: Skin is warm.  Psychiatric: He has a normal mood and affect. His behavior is normal. Judgment and thought content normal.    BP 123/76 (BP Location: Left Arm, Patient Position: Sitting, Cuff Size: Normal)   Pulse (!) 124   Temp 98.9 F (37.2 C) (Oral)   Resp 16   Ht 5\' 3"  (1.6 m)   Wt 122 lb (55.3 kg)   SpO2 100%   BMI 21.61 kg/m  Wt Readings from Last 3 Encounters:  06/03/19 122 lb (55.3 kg)  05/06/19 119 lb 4.3 oz (54.1 kg)  04/15/19 122 lb (55.3 kg)     There are no  preventive care reminders to display for this patient.  There are no preventive care reminders to display for this patient.  Lab Results  Component Value Date   TSH 2.860 05/11/2017   Lab Results  Component Value Date   WBC 5.8 05/13/2019   HGB 7.2 (L) 05/13/2019   HCT 23.4 (L) 05/13/2019   MCV 90.7 05/13/2019   PLT 247 05/13/2019   Lab Results  Component Value Date   NA 133 (L) 05/07/2019   K 4.6 05/07/2019   CO2 19 (L) 05/07/2019   GLUCOSE 97 05/07/2019   BUN 34 (H) 05/07/2019   CREATININE 2.41 (H) 05/07/2019   BILITOT 0.8 05/06/2019   ALKPHOS 269 (H) 05/06/2019   AST 21 05/06/2019   ALT 21 05/06/2019   PROT 7.1 05/06/2019   ALBUMIN 3.0 (L) 05/06/2019   CALCIUM 8.8 (L) 05/07/2019   ANIONGAP 6 05/07/2019   Lab Results  Component Value Date   CHOL 145 03/07/2018   Lab Results  Component Value Date   HDL 65 03/07/2018   Lab Results  Component Value Date   LDLCALC 30 03/07/2018   Lab Results  Component Value Date   TRIG 251 (H) 03/07/2018   Lab Results  Component Value Date   CHOLHDL 2.2 03/07/2018   Lab Results  Component Value Date   HGBA1C <4.2 (L) 03/07/2018      Assessment & Plan:   Problem List Items Addressed This Visit      Other   Hb-SS disease without crisis ([place])   Relevant Orders   Comprehensive metabolic panel   CBC with Differential   Chronic pain syndrome - Primary   Relevant Orders   [id number] 11+Oxyco+Alc+Crt-Bund     Chronic pain syndrome Reviewed PDMP substance reporting system prior to prescribing opiate medications. No inconsistencies noted.    - [id number] 11+Oxyco+Alc+Crt-Bund - oxyCODONE (OXYCONTIN) 15 mg 12 hr  tablet; Take 1 tablet (15 mg total) by mouth every 12 (twelve) hours.  Dispense: 60 tablet; Refill: 0  Hb-SS disease without crisis [name]) Patient advised to follow-up with hematology provider concerning darbepoetin injections.  Recommend that provider fax this order to sickle cell center and he can receive  injections as often as prescribed at this center. Patient is unable to have frequent appointments with [facility] hematology due to transportation constraints. Patient advised to continue to hold hydroxyurea, reviewed previous labs from hematology visit, platelet count decreased.  Will review current laboratory values as results become available.  - Comprehensive metabolic panel - CBC with Differential - Urinalysis Dipstick - Oxycodone HCl 10 MG TABS; Take 1 tablet (10 mg total) by mouth every 6 (six) hours as needed.  Dispense: 60 tablet; Refill: 0 - oxyCODONE (OXYCONTIN) 15 mg 12 hr tablet; Take 1 tablet (15 mg total) by mouth every 12 (twelve) hours.  Dispense: 60 tablet; Refill: 0   Follow-up: Follow up in 3 months for medication management       [name]  APRN, MSN, FNP-C [facility] [address] [place], [place] [address] [phone]

## 2019-06-04 ENCOUNTER — Encounter (HOSPITAL_COMMUNITY): Payer: Self-pay

## 2019-06-04 ENCOUNTER — Telehealth: Payer: Self-pay

## 2019-06-04 ENCOUNTER — Encounter: Payer: Self-pay | Admitting: Family Medicine

## 2019-06-04 ENCOUNTER — Non-Acute Institutional Stay (HOSPITAL_COMMUNITY)
Admission: RE | Admit: 2019-06-04 | Discharge: 2019-06-04 | Disposition: A | Discharge status: Home / Self Care | Payer: Medicare Other | Source: Ambulatory Visit | Attending: Internal Medicine | Admitting: Internal Medicine

## 2019-06-04 ENCOUNTER — Other Ambulatory Visit: Payer: Self-pay | Admitting: Family Medicine

## 2019-06-04 VITALS — BP 99/72 | HR 85 | Temp 98.3°F | Resp 15

## 2019-06-04 DIAGNOSIS — F17210 Nicotine dependence, cigarettes, uncomplicated: Secondary | ICD-10-CM | POA: Diagnosis not present

## 2019-06-04 DIAGNOSIS — K219 Gastro-esophageal reflux disease without esophagitis: Secondary | ICD-10-CM | POA: Insufficient documentation

## 2019-06-04 DIAGNOSIS — Z79899 Other long term (current) drug therapy: Secondary | ICD-10-CM | POA: Insufficient documentation

## 2019-06-04 DIAGNOSIS — N184 Chronic kidney disease, stage 4 (severe): Secondary | ICD-10-CM | POA: Diagnosis present

## 2019-06-04 DIAGNOSIS — D638 Anemia in other chronic diseases classified elsewhere: Secondary | ICD-10-CM | POA: Diagnosis present

## 2019-06-04 DIAGNOSIS — F1120 Opioid dependence, uncomplicated: Secondary | ICD-10-CM | POA: Insufficient documentation

## 2019-06-04 DIAGNOSIS — D649 Anemia, unspecified: Secondary | ICD-10-CM | POA: Diagnosis not present

## 2019-06-04 DIAGNOSIS — Z886 Allergy status to analgesic agent status: Secondary | ICD-10-CM | POA: Diagnosis not present

## 2019-06-04 DIAGNOSIS — M79606 Pain in leg, unspecified: Secondary | ICD-10-CM | POA: Diagnosis not present

## 2019-06-04 DIAGNOSIS — M545 Low back pain: Secondary | ICD-10-CM | POA: Diagnosis not present

## 2019-06-04 DIAGNOSIS — D5700 Hb-SS disease with crisis, unspecified: Secondary | ICD-10-CM | POA: Diagnosis not present

## 2019-06-04 DIAGNOSIS — Z885 Allergy status to narcotic agent status: Secondary | ICD-10-CM | POA: Insufficient documentation

## 2019-06-04 DIAGNOSIS — G894 Chronic pain syndrome: Secondary | ICD-10-CM | POA: Diagnosis not present

## 2019-06-04 LAB — CBC WITH DIFFERENTIAL/PLATELET
Basophils Absolute: 0 10*3/uL (ref 0.0–0.2)
Basos: 0 %
EOS (ABSOLUTE): 0 10*3/uL (ref 0.0–0.4)
Eos: 1 %
Hematocrit: 17.4 % — CL (ref 37.5–51.0)
Hemoglobin: 5.5 g/dL — CL (ref 13.0–17.7)
Immature Grans (Abs): 0 10*3/uL (ref 0.0–0.1)
Immature Granulocytes: 0 %
Lymphocytes Absolute: 4.1 10*3/uL — ABNORMAL HIGH (ref 0.7–3.1)
Lymphs: 63 %
MCH: 28.9 pg (ref 26.6–33.0)
MCHC: 31.6 g/dL (ref 31.5–35.7)
MCV: 92 fL (ref 79–97)
Monocytes Absolute: 0.1 10*3/uL (ref 0.1–0.9)
Monocytes: 2 %
Neutrophils Absolute: 2.2 10*3/uL (ref 1.4–7.0)
Neutrophils: 34 %
RBC: 1.9 x10E6/uL — CL (ref 4.14–5.80)
RDW: 16.8 % — ABNORMAL HIGH (ref 11.6–15.4)
WBC: 6.5 10*3/uL (ref 3.4–10.8)

## 2019-06-04 LAB — CBC
HCT: 17.8 % — ABNORMAL LOW (ref 39.0–52.0)
HCT: 22.4 % — ABNORMAL LOW (ref 39.0–52.0)
Hemoglobin: 5.6 g/dL — CL (ref 13.0–17.0)
Hemoglobin: 7.1 g/dL — ABNORMAL LOW (ref 13.0–17.0)
MCH: 29.9 pg (ref 26.0–34.0)
MCH: 29.8 pg (ref 26.0–34.0)
MCHC: 31.5 g/dL (ref 30.0–36.0)
MCHC: 31.7 g/dL (ref 30.0–36.0)
MCV: 95.2 fL (ref 80.0–100.0)
MCV: 94.1 fL (ref 80.0–100.0)
Platelets: 11 10*3/uL — CL (ref 150–400)
Platelets: 10 10*3/uL — CL (ref 150–400)
RBC: 1.87 MIL/uL — ABNORMAL LOW (ref 4.22–5.81)
RBC: 2.38 MIL/uL — ABNORMAL LOW (ref 4.22–5.81)
RDW: 21.1 % — ABNORMAL HIGH (ref 11.5–15.5)
RDW: 17.7 % — ABNORMAL HIGH (ref 11.5–15.5)
WBC: 8.3 10*3/uL (ref 4.0–10.5)
WBC: 6.6 10*3/uL (ref 4.0–10.5)
nRBC: 0 % (ref 0.0–0.2)
nRBC: 0 % (ref 0.0–0.2)

## 2019-06-04 LAB — COMPREHENSIVE METABOLIC PANEL
ALT: 23 IU/L (ref 0–44)
AST: 35 IU/L (ref 0–40)
Albumin/Globulin Ratio: 1.1 — ABNORMAL LOW (ref 1.2–2.2)
Albumin: 3.4 g/dL — ABNORMAL LOW (ref 4.0–5.0)
Alkaline Phosphatase: 272 IU/L — ABNORMAL HIGH (ref 39–117)
BUN/Creatinine Ratio: 15 (ref 9–20)
BUN: 31 mg/dL — ABNORMAL HIGH (ref 6–20)
Bilirubin Total: 0.3 mg/dL (ref 0.0–1.2)
CO2: 16 mmol/L — ABNORMAL LOW (ref 20–29)
Calcium: 7.7 mg/dL — ABNORMAL LOW (ref 8.7–10.2)
Chloride: 109 mmol/L — ABNORMAL HIGH (ref 96–106)
Creatinine, Ser: 2.11 mg/dL — ABNORMAL HIGH (ref 0.76–1.27)
GFR calc Af Amer: 45 mL/min/{1.73_m2} — ABNORMAL LOW (ref >59)
GFR calc non Af Amer: 39 mL/min/{1.73_m2} — ABNORMAL LOW (ref >59)
Globulin, Total: 3 g/dL (ref 1.5–4.5)
Glucose: 116 mg/dL — ABNORMAL HIGH (ref 65–99)
Potassium: 4.5 mmol/L (ref 3.5–5.2)
Sodium: 138 mmol/L (ref 134–144)
Total Protein: 6.4 g/dL (ref 6.0–8.5)

## 2019-06-04 LAB — DIFFERENTIAL
Abs Immature Granulocytes: 0.04 10*3/uL (ref 0.00–0.07)
Basophils Absolute: 0 10*3/uL (ref 0.0–0.1)
Basophils Relative: 0 %
Eosinophils Absolute: 0.1 10*3/uL (ref 0.0–0.5)
Eosinophils Relative: 1 %
Immature Granulocytes: 1 %
Lymphocytes Relative: 51 %
Lymphs Abs: 4.3 10*3/uL — ABNORMAL HIGH (ref 0.7–4.0)
Monocytes Absolute: 0.2 10*3/uL (ref 0.1–1.0)
Monocytes Relative: 2 %
Neutro Abs: 3.8 10*3/uL (ref 1.7–7.7)
Neutrophils Relative %: 45 %

## 2019-06-04 LAB — PREPARE RBC (CROSSMATCH)

## 2019-06-04 MED ORDER — HYDROMORPHONE HCL 2 MG/ML IJ SOLN
2.0000 mg | Freq: Once | INTRAMUSCULAR | Status: AC
  Administered 2019-06-04: 13:00:00 2 mg via INTRAVENOUS
  Filled 2019-06-04: qty 1

## 2019-06-04 MED ORDER — SODIUM CHLORIDE 0.9 % IV SOLN
INTRAVENOUS | Status: DC | PRN
  Administered 2019-06-04: 13:00:00 250 mL via INTRAVENOUS

## 2019-06-04 MED ORDER — SODIUM CHLORIDE 0.9% IV SOLUTION: Freq: Once | INTRAVENOUS | Status: AC

## 2019-06-04 MED ORDER — DIPHENHYDRAMINE HCL 50 MG/ML IJ SOLN
25.0000 mg | Freq: Once | INTRAMUSCULAR | Status: AC
  Administered 2019-06-04: 13:00:00 25 mg via INTRAVENOUS
  Filled 2019-06-04: qty 1

## 2019-06-04 MED ORDER — OXYCODONE HCL 5 MG PO TABS: 10.0000 mg | ORAL_TABLET | ORAL | Status: DC | PRN

## 2019-06-04 MED ORDER — ACETAMINOPHEN 325 MG PO TABS
650.0000 mg | ORAL_TABLET | Freq: Once | ORAL | Status: AC
  Administered 2019-06-04: 650 mg via ORAL
  Filled 2019-06-04: qty 2

## 2019-06-04 NOTE — Progress Notes (Signed)
CRITICAL VALUE ALERT  Critical Value:  Hemoglobin 5.6; platelets less than "11"  Date & Time Notified:  06/04/19; 12:26 pm  Provider Notified: Cammie Sickle FNP  Orders Received/Actions taken: 1 unit of blood to be transfused

## 2019-06-04 NOTE — Discharge Instructions (Signed)
Sickle Cell Anemia, Adult  Sickle cell anemia is a condition where your red blood cells are shaped like sickles. Red blood cells carry oxygen through the body. Sickle-shaped cells do not live as long as normal red blood cells. They also clump together and block blood from flowing through the blood vessels. This prevents the body from getting enough oxygen. Sickle cell anemia causes organ damage and pain. It also increases the risk of infection. Follow these instructions at home: Medicines  Take over-the-counter and prescription medicines only as told by your doctor.  If you were prescribed an antibiotic medicine, take it as told by your doctor. Do not stop taking the antibiotic even if you start to feel better.  If you develop a fever, do not take medicines to lower the fever right away. Tell your doctor about the fever. Managing pain, stiffness, and swelling  Try these methods to help with pain: ? Use a heating pad. ? Take a warm bath. ? Distract yourself, such as by watching TV. Eating and drinking  Drink enough fluid to keep your pee (urine) clear or pale yellow. Drink more in hot weather and during exercise.  Limit or avoid alcohol.  Eat a healthy diet. Eat plenty of fruits, vegetables, whole grains, and lean protein.  Take vitamins and supplements as told by your doctor. Traveling  When traveling, keep these with you: ? Your medical information. ? The names of your doctors. ? Your medicines.  If you need to take an airplane, talk to your doctor first. Activity  Rest often.  Avoid exercises that make your heart beat much faster, such as jogging. General instructions  Do not use products that have nicotine or tobacco, such as cigarettes and e-cigarettes. If you need help quitting, ask your doctor.  Consider wearing a medical alert bracelet.  Avoid being in high places (high altitudes), such as mountains.  Avoid very hot or cold temperatures.  Avoid places where the  temperature changes a lot.  Keep all follow-up visits as told by your doctor. This is important. Contact a doctor if:  A joint hurts.  Your feet or hands hurt or swell.  You feel tired (fatigued). Get help right away if:  You have symptoms of infection. These include: ? Fever. ? Chills. ? Being very tired. ? Irritability. ? Poor eating. ? Throwing up (vomiting).  You feel dizzy or faint.  You have new stomach pain, especially on the left side.  You have a an erection (priapism) that lasts more than 4 hours.  You have numbness in your arms or legs.  You have a hard time moving your arms or legs.  You have trouble talking.  You have pain that does not go away when you take medicine.  You are short of breath.  You are breathing fast.  You have a long-term cough.  You have pain in your chest.  You have a bad headache.  You have a stiff neck.  Your stomach looks bloated even though you did not eat much.  Your skin is pale.  You suddenly cannot see well. Summary  Sickle cell anemia is a condition where your red blood cells are shaped like sickles.  Follow your doctor's advice on ways to manage pain, food to eat, activities to do, and steps to take for safe travel.  Get medical help right away if you have any signs of infection, such as a fever. This information is not intended to replace advice given to you by   your health care provider. Make sure you discuss any questions you have with your health care provider. Document Revised: 05/24/2018 Document Reviewed: 03/07/2016 Elsevier Patient Education  2020 [employer]  

## 2019-06-04 NOTE — Telephone Encounter (Signed)
[name] [name] with [facility] called to report:   Critical labs Hemoglobin 5.5 & Hematocrit 17.4.   (Per [name] David NP, [name] Sickle FNP already aware of results).

## 2019-06-04 NOTE — Progress Notes (Signed)
  [facility]   Platelet count critically low. Fragmented platelets and clumps noted. Will repeat CBC following transfusion of PRBCs.    [name]  APRN, MSN, FNP-C Patient [facility] [address] [place], [place] [address] [phone]

## 2019-06-04 NOTE — Progress Notes (Addendum)
  72 Pt arrived at the [facility] for a blood transfusion. Pt arrived A&Ox4, ambulatory. Pt requesting IV team for blood work and IV. Consult placed.   1130 Blood sent to lab.  1200 IV team here to place IV. NS running at [facility].   1300 Blood picked up from the blood bank. Blood checked and verified. Second RN checked off.   3668 Blood infusion started. Pt resting in recliner, RN next to patient monitoring for S/S of reaction. WCTM.   1400 Pt tolerating well, resting in recliner. WCTM.   1500 Pt tolerating well, WCTM.  1555 Blood infusion done. Post infusion vital signs stable, WCTM.   1625 CBC drawn and sent to lab. Pt discharged home, RN reviewed discharge instructions with pt. Pt understands without assistance. IV removed without difficulty. Pt A&Ox4 & ambulatory at time of discharge.

## 2019-06-04 NOTE — Progress Notes (Signed)
[name]  APRN, MSN, FNP-C [facility] [address] [place], [place] [address] [phone]  [name] [name], a 40 year old male with a medical history significant for sickle cell disease type SS has a critical hemoglobin of 5.5 per oncall provider. Will transfuse 1 unit of PRBCs in sickle cell day infusion center.   [name]  APRN, MSN, FNP-C [facility] [address] [place], [place] [address] [phone]

## 2019-06-04 NOTE — H&P (Signed)
[facility] History and Physical   Date: 06/04/2019  Patient name: [name] [name]. Medical record number: [id number] Date of birth: 05-12-1980 Age: 39 y.o. Gender: male PCP: [name], MD  Attending physician: [name], MD  Chief Complaint: Sickle cell pain  History of Present Illness: [name] [name], a 39 year old male with a medical history significant for sickle cell disease, chronic kidney disease stage III, chronic pain syndrome, opiate dependence and tolerance, history of anemia of chronic disease, history of polysubstance abuse, and tobacco dependence presents complaining of low back and lower extremity pain that is consistent with his previous sickle cell crisis.  Patient was notified earlier of critical hemoglobin, which is 5.5.  Hemoglobin is below patient's baseline of 6.0-7.0 g/dL.  Upon arrival to clinic, patient complained of increased pain.  He says that he did not take prescribed home medications this morning.  Pain intensity is 7/10 characterized as constant and throbbing.  Patient denies fever, chills, chest pain, shortness of breath, nausea, vomiting, or diarrhea.  He endorses fatigue.  He states that he felt extremely tired upon awakening this morning.  He denies sick contacts, recent travel, or exposure to COVID-19.  Patient denies any signs of bleeding.  Also, he has not had any recent injury.  Mr. Schrieber is followed by [facility] hematology, he states that he was scheduled to receive Aranesp injections and hydroxyurea has been held.  Patient says that his pharmacy does not carry medication so he did not receive second dose 1 week ago  Meds: Medications Prior to Admission  Medication Sig Dispense Refill Last Dose  . acetaminophen (TYLENOL) 500 MG tablet Take 1 tablet (500 mg total) by mouth every 6 (six) hours as needed. (Patient taking differently: Take 500 mg by mouth every 6 (six) hours as needed for moderate pain. ) 30 tablet 0    . Cholecalciferol (VITAMIN D3) 50 MCG (2000 UT) TABS Take 2 tablets by mouth daily.      . folic acid (FOLVITE) 1 MG tablet Take 1 tablet (1 mg total) by mouth daily. 30 tablet 12   . gabapentin (NEURONTIN) 300 MG capsule Take 1 capsule (300 mg total) by mouth 2 (two) times daily as needed. 60 capsule 1   . hydroxyurea (HYDREA) 500 MG capsule TAKE 3 CAPSULES BY MOUTH EVERY DAY (Patient taking differently: Take 1,500 mg by mouth daily. ) 270 capsule 1   . ibuprofen (ADVIL) 200 MG tablet Take 200 mg by mouth every 6 (six) hours as needed for moderate pain.     . nortriptyline (PAMELOR) 25 MG capsule Take 1 capsule (25 mg total) by mouth 2 (two) times daily. 90 capsule 3   . [START ON 06/08/2019] oxyCODONE (OXYCONTIN) 15 mg 12 hr tablet Take 1 tablet (15 mg total) by mouth every 12 (twelve) hours. 60 tablet 0   . Oxycodone HCl 10 MG TABS Take 1 tablet (10 mg total) by mouth every 6 (six) hours as needed. 60 tablet 0   . sodium bicarbonate 650 MG tablet Take 650 mg by mouth 2 (two) times daily.       Allergies: Nsaids and Morphine and related Past Medical History:  Diagnosis Date  . Abscess of right iliac fossa 09/24/2018   required Perc Drain 09/24/2018  . Arachnoid Cyst of brain bilaterally    "2 really small ones in the back of my head; inside; saw them w/MRI" (09/25/2012)  . Bacterial pneumonia ~ 2012   "caught it here in  the hospital" (09/25/2012)  . Chronic kidney disease    "from my sickle cell" (09/25/2012)  . CKD (chronic kidney disease), stage II   . Colitis 04/19/2017   CT scan abd/ pelvis  . GERD (gastroesophageal reflux disease)    "after I eat alot of spicey foods" (09/25/2012)  . Gynecomastia, male 07/10/2012  . History of blood transfusion    "always related to sickle cell crisis" (09/25/2012)  . 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   Noted in noted from Hematology notes at [facility]  . Migraines    "take RX qd to prevent them" (09/25/2012)  . Opioid dependence  with withdrawal ([place]) 08/30/2012  . Renal insufficiency   . Sickle cell anemia (HCC)   . Sickle cell crisis ([place]) 09/25/2012  . Sickle cell nephropathy ([place]) 07/10/2012  . Sinus tachycardia   . Tachycardia with heart rate 121-140 beats per minute with ambulation 08/04/2016   Past Surgical History:  Procedure Laterality Date  . ABCESS DRAINAGE    . CHOLECYSTECTOMY  ~ 2012  . COLONOSCOPY N/A 04/23/2017   Procedure: COLONOSCOPY;  Surgeon: Irene Shipper, MD;  Location: [facility];  Service: Endoscopy;  Laterality: N/A;  . IR FLUORO GUIDE CV LINE RIGHT  12/17/2016  . IR RADIOLOGIST EVAL & MGMT  10/02/2018  . IR RADIOLOGIST EVAL & MGMT  10/15/2018  . IR REMOVAL TUN CV CATH W/O FL  12/21/2016  . IR US GUIDE VASC ACCESS RIGHT  12/17/2016  . spleenectomy     Family History  Problem Relation Age of Onset  . Breast cancer Mother    Social History   Socioeconomic History  . Marital status: Single    Spouse name: Not on file  . Number of children: Not on file  . Years of education: Not on file  . Highest education level: Not on file  Occupational History  . Not on file  Tobacco Use  . Smoking status: Current Some Day Smoker    Packs/day: 0.10    Types: Cigarettes  . Smokeless tobacco: Never Used  . Tobacco comment: 09/25/2012 "I don't buy cigarettes; bum one from friends q now and then"  Substance and Sexual Activity  . Alcohol use: Yes    Alcohol/week: 0.0 standard drinks    Comment: Once a month  . Drug use: Yes    Types: Marijuana    Comment: Every 2-3 weeks   . Sexual activity: Yes  Other Topics Concern  . Not on file  Social History Narrative    Lives alone in [place].   Back at school, studying Public relations account executive. Unemployed.    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    Social Determinants of Health   Financial Resource Strain:    . Difficulty of Paying Living Expenses:   Food Insecurity:   . Worried About Charity fundraiser in the Last Year:   . Arboriculturist in the Last Year:   Transportation Needs:   . Film/video editor (Medical):   Marland Kitchen Lack of Transportation (Non-Medical):   Physical Activity:   . Days of Exercise per Week:   . Minutes of Exercise per Session:   Stress:   . Feeling of Stress :   Social Connections:   . Frequency of Communication with Friends and Family:   . Frequency of Social Gatherings with Friends  and Family:   . Attends Religious Services:   . Active Member of Clubs or Organizations:   . Attends Archivist Meetings:   Marland Kitchen Marital Status:   Intimate Partner Violence:   . Fear of Current or Ex-Partner:   . Emotionally Abused:   Marland Kitchen Physically Abused:   . Sexually Abused:    Review of Systems  Constitutional: Positive for malaise/fatigue. Negative for chills and fever.  Eyes: Negative.   Respiratory: Negative.   Cardiovascular: Negative.  Negative for chest pain.  Genitourinary: Negative.  Negative for dysuria and hematuria.  Musculoskeletal: Positive for back pain and joint pain.  Skin: Negative.   Neurological: Negative.   Psychiatric/Behavioral: Negative.  The patient is not nervous/anxious and does not have insomnia.    Physical Exam: Blood pressure 107/63, pulse 95, temperature 98.7 F (37.1 C), temperature source Temporal, resp. rate 16, SpO2 100 %.  Physical Exam Constitutional:      Appearance: Normal appearance.  HENT:     Head: Normocephalic.     Mouth/Throat:     Mouth: Mucous membranes are moist.     Pharynx: Oropharynx is clear.  Eyes:     Pupils: Pupils are equal, round, and reactive to light.  Cardiovascular:     Rate and Rhythm: Normal rate and regular rhythm.     Heart sounds: Murmur present.  Pulmonary:     Effort: Pulmonary effort is normal.  Abdominal:     General: Abdomen is flat. Bowel sounds are normal.  Musculoskeletal:         General: Normal range of motion.  Skin:    General: Skin is warm.  Neurological:     General: No focal deficit present.     Mental Status: He is alert. Mental status is at baseline.  Psychiatric:        Mood and Affect: Mood normal.        Behavior: Behavior normal.        Thought Content: Thought content normal.        Judgment: Judgment normal.    Lab results: Results for orders placed or performed during the hospital encounter of 06/04/19 (from the past 24 hour(s))  Type and screen     Status: None (Preliminary result)   Collection Time: 06/04/19 11:33 AM  Result Value Ref Range   ABO/RH(D) O POS    Antibody Screen NEG    Sample Expiration 06/07/2019,2359    Unit Number [id number]    Blood Component Type RED CELLS,LR    Unit division 00    Status of Unit ISSUED    Donor AG Type NEGATIVE FOR KELL ANTIGEN NEGATIVE FOR C ANTIGEN    Transfusion Status OK TO TRANSFUSE    Crossmatch Result      Compatible Performed at [facility], [place] [address], [place], [place] [address]   Prepare RBC (crossmatch)     Status: None   Collection Time: 06/04/19 11:33 AM  Result Value Ref Range   Order Confirmation      ORDER PROCESSED BY BLOOD BANK Performed at [facility], [place] [address] [place], [place] [address]   CBC     Status: Abnormal   Collection Time: 06/04/19 11:35 AM  Result Value Ref Range   WBC 8.3 4.0 - 10.5 K/uL   RBC 1.87 (L) 4.22 - 5.81 MIL/uL   Hemoglobin 5.6 (LL) 13.0 - 17.0 g/dL   HCT 17.8 (L) 39.0 - 52.0 %   MCV 95.2 80.0 -  100.0 fL   MCH 29.9 26.0 - 34.0 pg   MCHC 31.5 30.0 - 36.0 g/dL   RDW 21.1 (H) 11.5 - 15.5 %   Platelets 11 (LL) 150 - 400 K/uL   nRBC 0.0 0.0 - 0.2 %  Differential     Status: Abnormal   Collection Time: 06/04/19 11:35 AM  Result Value Ref Range   Neutrophils Relative % 45 %   Neutro Abs 3.8 1.7 - 7.7 K/uL   Lymphocytes Relative 51 %   Lymphs Abs 4.3 (H) 0.7 - 4.0 K/uL   Monocytes Relative 2 %    Monocytes Absolute 0.2 0.1 - 1.0 K/uL   Eosinophils Relative 1 %   Eosinophils Absolute 0.1 0.0 - 0.5 K/uL   Basophils Relative 0 %   Basophils Absolute 0.0 0.0 - 0.1 K/uL   Immature Granulocytes 1 %   Abs Immature Granulocytes 0.04 0.00 - 0.07 K/uL   Polychromasia PRESENT    Sickle Cells PRESENT    Target Cells PRESENT     Imaging results:  No results found.   Assessment & Plan: Sickle cell disease with pain crisis: Patient admitted to sickle cell day infusion center for management of sickle cell pain crisis. He is opiate tolerant. Dilaudid 1 mg IV every 3 hours as needed Oxycodone 10 mg every 3 hours as needed for severe breakthrough. Hold IV Toradol due to stage III chronic kidney disease Pain will be reevaluated in context of functioning in relationship to baseline as his care progresses The pain intensity remains elevated or hemodynamic stability changes, consider inpatient admission.  Symptomatic anemia: Patient's hemoglobin is 5.5.  He endorses fatigue.  Will transfuse 1 unit of PRBCs on today.  It is noted that patient had platelet clumps on smear, inadequate platelet count, will repeat. Patient will receive Tylenol 650 mg x 1 and Benadryl 12.5 mg IV x1 prior to transfusion.  Stage III chronic kidney disease: Creatinine improved from previous.  2.11.  Patient is followed by outpatient nephrology.  [name]  APRN, MSN, FNP-C [facility] [address] [place], [place] [address] [phone]  06/04/2019, 1:48 PM

## 2019-06-05 ENCOUNTER — Other Ambulatory Visit: Payer: Self-pay | Admitting: Family Medicine

## 2019-06-05 LAB — BPAM RBC
Blood Product Expiration Date: 202105082359
ISSUE DATE / TIME: 202104211259
Unit Type and Rh: 9500

## 2019-06-05 LAB — TYPE AND SCREEN
ABO/RH(D): O POS
Antibody Screen: NEGATIVE
Donor AG Type: NEGATIVE
Unit division: 0

## 2019-06-05 NOTE — Discharge Summary (Signed)
[facility] Discharge Summary   Patient ID: [name] [name]. MRN: [id number] DOB/AGE: October 21, 1980 39 y.o.  Admit date: 06/04/2019 Discharge date: 06/05/2019  Primary Care Physician:  [name], MD  Admission Diagnoses:  Principal Problem:   Sickle cell pain crisis [facility]) Active Problems:   CKD (chronic kidney disease), stage IV (HCC)   Symptomatic anemia   Discharge Medications:  Allergies as of 06/04/2019      Reactions   Nsaids Other (See Comments)   Kidney disease   Morphine And Related Other (See Comments)   "Real bad headaches"       Medication List    TAKE these medications   acetaminophen 500 MG tablet Commonly known as: TYLENOL Take 1 tablet (500 mg total) by mouth every 6 (six) hours as needed. What changed: reasons to take this   folic acid 1 MG tablet Commonly known as: FOLVITE Take 1 tablet (1 mg total) by mouth daily.   gabapentin 300 MG capsule Commonly known as: NEURONTIN Take 1 capsule (300 mg total) by mouth 2 (two) times daily as needed.   hydroxyurea 500 MG capsule Commonly known as: HYDREA TAKE 3 CAPSULES BY MOUTH EVERY DAY What changed:   how much to take  how to take this  when to take this  additional instructions   ibuprofen 200 MG tablet Commonly known as: ADVIL Take 200 mg by mouth every 6 (six) hours as needed for moderate pain.   nortriptyline 25 MG capsule Commonly known as: PAMELOR Take 1 capsule (25 mg total) by mouth 2 (two) times daily.   Oxycodone HCl 10 MG Tabs Take 1 tablet (10 mg total) by mouth every 6 (six) hours as needed.   oxyCODONE 15 mg 12 hr tablet Commonly known as: OxyCONTIN Take 1 tablet (15 mg total) by mouth every 12 (twelve) hours. Start taking on: June 08, 2019   sodium bicarbonate 650 MG tablet Take 650 mg by mouth 2 (two) times daily.   Vitamin D3 50 MCG (2000 UT) Tabs Take 2 tablets by mouth daily.        Consults:  None  Significant Diagnostic  Studies:  No results found.  History of present illness:   [name] [name], a 39 year old male with a medical history significant for sickle cell disease, chronic kidney disease stage III, chronic pain syndrome, opiate dependence and tolerance, history of anemia of chronic disease, history of polysubstance abuse, and tobacco dependence presents complaining of low back and lower extremity pain that is consistent with his previous sickle cell crisis.  Patient was notified earlier of critical hemoglobin, which is 5.5.  Hemoglobin is below patient's baseline of 6.0-7.0 g/dL.  Upon arrival to clinic, patient complained of increased pain.  He says that he did not take prescribed home medications this morning.  Pain intensity is 7/10 characterized as constant and throbbing.  Patient denies fever, chills, chest pain, shortness of breath, nausea, vomiting, or diarrhea.  He endorses fatigue.  He states that he felt extremely tired upon awakening this morning.  He denies sick contacts, recent travel, or exposure to COVID-19.  Patient denies any signs of bleeding.  Also, he has not had any recent injury.  [name] is followed by [facility] hematology, he states that he was scheduled to receive Aranesp injections and hydroxyurea has been held.  Patient says that his pharmacy does not carry medication so he did not receive second dose 1 week ago [facility] Course: Sickle cell pain  crisis:  Patient admitted to sickle cell day infusion center for symptomatic anemia in the setting of sickle cell pain crisis.  Pain managed with IV Dilauidid 1 mg IV every 3 hours as needed.  Held IV Toradol due to stage 3 chronic kidney disease Oxycodone 10 mg every 3 hours as needed for severe breakthrough pain  Pain intensity decreased to 5/10, patient does not warrant admission at this time.   Symptomatic anemia:  Patient's hemoglobin was 5.5 on admission. Patient received 1 unit of PRBCs without complication.   Hemoglobin increased to 7.1, which is consistent with patient's baseline.  Patient's platelet count is falsely decreased due to platelet clumps. Lab unable to perform accurate platelet count due to increased platelet clumps. Patient advised to hold hydroxyurea.  Follow up in clinic for labs on Tuesday.   Chronic kidney disease:  Follow up with nephrology as scheduled.   Physical Exam at Discharge:  BP 99/72   Pulse 85   Temp 98.3 F (36.8 C) (Temporal)   Resp 15   SpO2 100%   Physical Exam Constitutional:      Appearance: Normal appearance.  HENT:     Nose: Nose normal.  Eyes:     Pupils: Pupils are equal, round, and reactive to light.  Cardiovascular:     Rate and Rhythm: Normal rate and regular rhythm.     Heart sounds: Murmur present.  Abdominal:     General: Abdomen is flat. Bowel sounds are normal.  Musculoskeletal:        General: Normal range of motion.  Skin:    General: Skin is warm.  Neurological:     General: No focal deficit present.     Mental Status: He is alert. Mental status is at baseline.  Psychiatric:        Mood and Affect: Mood normal.        Behavior: Behavior normal.        Thought Content: Thought content normal.        Judgment: Judgment normal.     Disposition at Discharge: Discharge disposition: 01-Home or Self Care       Discharge Orders: Discharge Instructions    Discharge patient   Complete by: As directed    Discharge disposition: 01-Home or Self Care   Discharge patient date: 06/04/2019      Condition at Discharge:   Stable  Time spent on Discharge:  Greater than 30 minutes.  Signed: [name]  APRN, MSN, FNP-C Patient [facility] [address] [place], [place] [address] [phone]  06/05/2019, 4:25 PM

## 2019-06-07 LAB — DRUG SCREEN 764883 11+OXYCO+ALC+CRT-BUND
Amphetamines, Urine: NEGATIVE ng/mL
BENZODIAZ UR QL: NEGATIVE ng/mL
Barbiturate: NEGATIVE ng/mL
Cocaine (Metabolite): NEGATIVE ng/mL
Creatinine: 48.9 mg/dL (ref 20.0–300.0)
Ethanol: NEGATIVE %
Meperidine: NEGATIVE ng/mL
Methadone Screen, Urine: NEGATIVE ng/mL
OPIATE SCREEN URINE: NEGATIVE ng/mL
Oxycodone/Oxymorphone, Urine: NEGATIVE ng/mL
Phencyclidine: NEGATIVE ng/mL
Propoxyphene: NEGATIVE ng/mL
Tramadol: NEGATIVE ng/mL
pH, Urine: 6.5 (ref 4.5–8.9)

## 2019-06-07 LAB — CANNABINOID CONFIRMATION, UR
CANNABINOIDS: POSITIVE — AB
Carboxy THC GC/MS Conf: 204 ng/mL

## 2019-06-11 ENCOUNTER — Other Ambulatory Visit: Payer: Self-pay | Admitting: Family Medicine

## 2019-06-11 DIAGNOSIS — D571 Sickle-cell disease without crisis: Secondary | ICD-10-CM

## 2019-06-11 NOTE — Progress Notes (Signed)
[name] [name], a 39 year old male with a medical history significant for sickle cell disease, chronic pain syndrome, opiate dependence and tolerance, anemia of chronic disease, and CKDIV was evaluated by hematology team who recommend discontinuing hydroxyurea and starting weekly procrit 20,000 SQ at [place]. Hold Procrit if hemoglobin is less than 8.0 g/dL. Discussed plan with Levora Angel, NP at length.    [name]  APRN, MSN, FNP-C Patient [facility] [address] [place], [place] [address] [phone]

## 2019-06-12 ENCOUNTER — Encounter (HOSPITAL_COMMUNITY): Payer: Self-pay | Admitting: Emergency Medicine

## 2019-06-12 ENCOUNTER — Other Ambulatory Visit: Payer: Self-pay

## 2019-06-12 ENCOUNTER — Telehealth: Payer: Self-pay | Admitting: Internal Medicine

## 2019-06-12 ENCOUNTER — Emergency Department (HOSPITAL_COMMUNITY): Payer: Medicare Other

## 2019-06-12 ENCOUNTER — Observation Stay (HOSPITAL_COMMUNITY)
Admission: EM | Admit: 2019-06-12 | Discharge: 2019-06-13 | Disposition: A | Discharge status: Home / Self Care | Payer: Medicare Other | Attending: Internal Medicine | Admitting: Internal Medicine

## 2019-06-12 DIAGNOSIS — D649 Anemia, unspecified: Secondary | ICD-10-CM

## 2019-06-12 DIAGNOSIS — D638 Anemia in other chronic diseases classified elsewhere: Secondary | ICD-10-CM | POA: Insufficient documentation

## 2019-06-12 DIAGNOSIS — N1832 Chronic kidney disease, stage 3b: Secondary | ICD-10-CM | POA: Diagnosis not present

## 2019-06-12 DIAGNOSIS — E872 Acidosis: Secondary | ICD-10-CM | POA: Diagnosis not present

## 2019-06-12 DIAGNOSIS — R222 Localized swelling, mass and lump, trunk: Secondary | ICD-10-CM | POA: Diagnosis not present

## 2019-06-12 DIAGNOSIS — Z886 Allergy status to analgesic agent status: Secondary | ICD-10-CM | POA: Insufficient documentation

## 2019-06-12 DIAGNOSIS — Z20822 Contact with and (suspected) exposure to covid-19: Secondary | ICD-10-CM | POA: Insufficient documentation

## 2019-06-12 DIAGNOSIS — N183 Chronic kidney disease, stage 3 unspecified: Secondary | ICD-10-CM | POA: Insufficient documentation

## 2019-06-12 DIAGNOSIS — Z79899 Other long term (current) drug therapy: Secondary | ICD-10-CM | POA: Insufficient documentation

## 2019-06-12 DIAGNOSIS — F17210 Nicotine dependence, cigarettes, uncomplicated: Secondary | ICD-10-CM | POA: Insufficient documentation

## 2019-06-12 DIAGNOSIS — R0789 Other chest pain: Secondary | ICD-10-CM | POA: Diagnosis not present

## 2019-06-12 DIAGNOSIS — M25462 Effusion, left knee: Secondary | ICD-10-CM | POA: Diagnosis not present

## 2019-06-12 DIAGNOSIS — E559 Vitamin D deficiency, unspecified: Secondary | ICD-10-CM | POA: Diagnosis not present

## 2019-06-12 DIAGNOSIS — Z885 Allergy status to narcotic agent status: Secondary | ICD-10-CM | POA: Diagnosis not present

## 2019-06-12 DIAGNOSIS — G894 Chronic pain syndrome: Secondary | ICD-10-CM | POA: Insufficient documentation

## 2019-06-12 DIAGNOSIS — D571 Sickle-cell disease without crisis: Secondary | ICD-10-CM

## 2019-06-12 DIAGNOSIS — D5700 Hb-SS disease with crisis, unspecified: Secondary | ICD-10-CM | POA: Diagnosis not present

## 2019-06-12 DIAGNOSIS — Z03818 Encounter for observation for suspected exposure to other biological agents ruled out: Secondary | ICD-10-CM | POA: Diagnosis not present

## 2019-06-12 DIAGNOSIS — D631 Anemia in chronic kidney disease: Secondary | ICD-10-CM | POA: Diagnosis not present

## 2019-06-12 DIAGNOSIS — N189 Chronic kidney disease, unspecified: Secondary | ICD-10-CM | POA: Diagnosis not present

## 2019-06-12 DIAGNOSIS — F1120 Opioid dependence, uncomplicated: Secondary | ICD-10-CM | POA: Insufficient documentation

## 2019-06-12 DIAGNOSIS — N049 Nephrotic syndrome with unspecified morphologic changes: Secondary | ICD-10-CM | POA: Diagnosis not present

## 2019-06-12 LAB — RETICULOCYTES
Immature Retic Fract: 9.5 % (ref 2.3–15.9)
RBC.: 1.97 MIL/uL — ABNORMAL LOW (ref 4.22–5.81)
Retic Count, Absolute: 18.1 10*3/uL — ABNORMAL LOW (ref 19.0–186.0)
Retic Ct Pct: 0.9 % (ref 0.4–3.1)

## 2019-06-12 LAB — CBC WITH DIFFERENTIAL/PLATELET
Abs Immature Granulocytes: 0.02 10*3/uL (ref 0.00–0.07)
Basophils Absolute: 0 10*3/uL (ref 0.0–0.1)
Basophils Relative: 0 %
Eosinophils Absolute: 0.1 10*3/uL (ref 0.0–0.5)
Eosinophils Relative: 2 %
HCT: 18.2 % — ABNORMAL LOW (ref 39.0–52.0)
Hemoglobin: 5.9 g/dL — CL (ref 13.0–17.0)
Immature Granulocytes: 1 %
Lymphocytes Relative: 66 %
Lymphs Abs: 2.9 10*3/uL (ref 0.7–4.0)
MCH: 30.6 pg (ref 26.0–34.0)
MCHC: 32.4 g/dL (ref 30.0–36.0)
MCV: 94.3 fL (ref 80.0–100.0)
Monocytes Absolute: 0.1 10*3/uL (ref 0.1–1.0)
Monocytes Relative: 3 %
Neutro Abs: 1.2 10*3/uL — ABNORMAL LOW (ref 1.7–7.7)
Neutrophils Relative %: 28 %
Platelets: 34 10*3/uL — ABNORMAL LOW (ref 150–400)
RBC: 1.93 MIL/uL — ABNORMAL LOW (ref 4.22–5.81)
RDW: 18.7 % — ABNORMAL HIGH (ref 11.5–15.5)
WBC: 4.3 10*3/uL (ref 4.0–10.5)
nRBC: 0 % (ref 0.0–0.2)

## 2019-06-12 LAB — COMPREHENSIVE METABOLIC PANEL
ALT: 33 U/L (ref 0–44)
AST: 37 U/L (ref 15–41)
Albumin: 2.9 g/dL — ABNORMAL LOW (ref 3.5–5.0)
Alkaline Phosphatase: 282 U/L — ABNORMAL HIGH (ref 38–126)
Anion gap: 6 (ref 5–15)
BUN: 31 mg/dL — ABNORMAL HIGH (ref 6–20)
CO2: 23 mmol/L (ref 22–32)
Calcium: 8.7 mg/dL — ABNORMAL LOW (ref 8.9–10.3)
Chloride: 110 mmol/L (ref 98–111)
Creatinine, Ser: 2.14 mg/dL — ABNORMAL HIGH (ref 0.61–1.24)
GFR calc Af Amer: 45 mL/min — ABNORMAL LOW (ref >60)
GFR calc non Af Amer: 38 mL/min — ABNORMAL LOW (ref >60)
Glucose, Bld: 100 mg/dL — ABNORMAL HIGH (ref 70–99)
Potassium: 4.4 mmol/L (ref 3.5–5.1)
Sodium: 139 mmol/L (ref 135–145)
Total Bilirubin: 0.4 mg/dL (ref 0.3–1.2)
Total Protein: 6.8 g/dL (ref 6.5–8.1)

## 2019-06-12 LAB — PREPARE RBC (CROSSMATCH)

## 2019-06-12 MED ORDER — HYDROMORPHONE HCL 2 MG/ML IJ SOLN
2.0000 mg | Freq: Once | INTRAMUSCULAR | Status: AC
  Administered 2019-06-12: 2 mg via INTRAVENOUS
  Filled 2019-06-12: qty 1

## 2019-06-12 MED ORDER — SODIUM CHLORIDE 0.9 % IV SOLN
10.0000 mL/h | Freq: Once | INTRAVENOUS | Status: AC
  Administered 2019-06-13: 08:00:00 10 mL/h via INTRAVENOUS

## 2019-06-12 MED ORDER — DIPHENHYDRAMINE HCL 25 MG PO CAPS
25.0000 mg | ORAL_CAPSULE | Freq: Once | ORAL | Status: AC
  Administered 2019-06-12: 20:00:00 25 mg via ORAL
  Filled 2019-06-12: qty 1

## 2019-06-12 MED ORDER — HYDROMORPHONE HCL 2 MG/ML IJ SOLN
2.0000 mg | Freq: Once | INTRAMUSCULAR | Status: AC
  Administered 2019-06-12: 20:00:00 2 mg via INTRAVENOUS
  Filled 2019-06-12: qty 1

## 2019-06-12 MED ORDER — SODIUM CHLORIDE 0.45 % IV SOLN: INTRAVENOUS | Status: DC

## 2019-06-12 MED ORDER — HYDROMORPHONE HCL 1 MG/ML IJ SOLN
1.0000 mg | INTRAMUSCULAR | Status: DC | PRN
  Filled 2019-06-12: qty 1

## 2019-06-12 MED ORDER — POLYETHYLENE GLYCOL 3350 17 G PO PACK: 17.0000 g | PACK | Freq: Every day | ORAL | Status: DC | PRN

## 2019-06-12 MED ORDER — ADULT MULTIVITAMIN W/MINERALS CH
1.0000 | ORAL_TABLET | Freq: Every day | ORAL | Status: DC
  Administered 2019-06-13: 1 via ORAL
  Filled 2019-06-12: qty 1

## 2019-06-12 MED ORDER — FOLIC ACID 1 MG PO TABS
1.0000 mg | ORAL_TABLET | Freq: Every day | ORAL | Status: DC
  Administered 2019-06-13: 09:00:00 1 mg via ORAL
  Filled 2019-06-12: qty 1

## 2019-06-12 MED ORDER — SENNOSIDES-DOCUSATE SODIUM 8.6-50 MG PO TABS
1.0000 | ORAL_TABLET | Freq: Two times a day (BID) | ORAL | Status: DC
  Administered 2019-06-13: 04:00:00 1 via ORAL
  Filled 2019-06-12 (×2): qty 1

## 2019-06-12 NOTE — Telephone Encounter (Signed)
Pt wants to know if he needs to get a transfusion. Pt states kidney doctor indicated he may need it. Can you please follow up asap.
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
## 2019-06-12 NOTE — ED Triage Notes (Addendum)
Patient c/o leg pain since last night. Reports seen at nephrologist today and told hemoglobin was 5.5. Hx SCC.  Patient requesting blood draw at time of IV start.

## 2019-06-12 NOTE — ED Notes (Signed)
x1 unsuccessful IV attempt. Pt reports they normally have to use [place] to get an IV.

## 2019-06-12 NOTE — ED Notes (Signed)
Date and time results received: 06/12/19 9:03 PM  Test: Hemoglobin Critical Value: 5.9  Name of Provider Notified: Dr. Rex Kras  Orders Received? Or Actions Taken?: Awaiting further orders

## 2019-06-13 ENCOUNTER — Encounter (HOSPITAL_COMMUNITY): Payer: Self-pay | Admitting: Internal Medicine

## 2019-06-13 DIAGNOSIS — D649 Anemia, unspecified: Secondary | ICD-10-CM | POA: Diagnosis not present

## 2019-06-13 DIAGNOSIS — D5700 Hb-SS disease with crisis, unspecified: Secondary | ICD-10-CM | POA: Diagnosis not present

## 2019-06-13 LAB — CBC WITH DIFFERENTIAL/PLATELET
Abs Immature Granulocytes: 0 10*3/uL (ref 0.00–0.07)
Basophils Absolute: 0 10*3/uL (ref 0.0–0.1)
Basophils Relative: 0 %
Eosinophils Absolute: 0.1 10*3/uL (ref 0.0–0.5)
Eosinophils Relative: 2 %
HCT: 21 % — ABNORMAL LOW (ref 39.0–52.0)
Hemoglobin: 6.7 g/dL — CL (ref 13.0–17.0)
Immature Granulocytes: 0 %
Lymphocytes Relative: 72 %
Lymphs Abs: 3 10*3/uL (ref 0.7–4.0)
MCH: 29.4 pg (ref 26.0–34.0)
MCHC: 31.9 g/dL (ref 30.0–36.0)
MCV: 92.1 fL (ref 80.0–100.0)
Monocytes Absolute: 0.2 10*3/uL (ref 0.1–1.0)
Monocytes Relative: 6 %
Neutro Abs: 0.8 10*3/uL — ABNORMAL LOW (ref 1.7–7.7)
Neutrophils Relative %: 20 %
Platelets: 35 10*3/uL — ABNORMAL LOW (ref 150–400)
RBC: 2.28 MIL/uL — ABNORMAL LOW (ref 4.22–5.81)
RDW: 17.3 % — ABNORMAL HIGH (ref 11.5–15.5)
WBC: 4.1 10*3/uL (ref 4.0–10.5)
nRBC: 0 % (ref 0.0–0.2)

## 2019-06-13 LAB — RAPID URINE DRUG SCREEN, HOSP PERFORMED
Amphetamines: NOT DETECTED
Barbiturates: NOT DETECTED
Benzodiazepines: NOT DETECTED
Cocaine: NOT DETECTED
Opiates: POSITIVE — AB
Tetrahydrocannabinol: POSITIVE — AB

## 2019-06-13 LAB — IMMATURE PLATELET FRACTION: Immature Platelet Fraction: 13.9 % — ABNORMAL HIGH (ref 1.2–8.6)

## 2019-06-13 LAB — PATHOLOGIST SMEAR REVIEW

## 2019-06-13 LAB — HEMOGLOBIN AND HEMATOCRIT, BLOOD
HCT: 28.6 % — ABNORMAL LOW (ref 39.0–52.0)
Hemoglobin: 9.4 g/dL — ABNORMAL LOW (ref 13.0–17.0)

## 2019-06-13 LAB — RESPIRATORY PANEL BY RT PCR (FLU A&B, COVID)
Influenza A by PCR: NEGATIVE
Influenza B by PCR: NEGATIVE
SARS Coronavirus 2 by RT PCR: NEGATIVE

## 2019-06-13 LAB — PREPARE RBC (CROSSMATCH)

## 2019-06-13 MED ORDER — SENNOSIDES-DOCUSATE SODIUM 8.6-50 MG PO TABS: 1.0000 | ORAL_TABLET | Freq: Two times a day (BID) | ORAL | Status: DC

## 2019-06-13 MED ORDER — GABAPENTIN 300 MG PO CAPS: 300.0000 mg | ORAL_CAPSULE | Freq: Two times a day (BID) | ORAL | Status: DC

## 2019-06-13 MED ORDER — NORTRIPTYLINE HCL 25 MG PO CAPS
25.0000 mg | ORAL_CAPSULE | Freq: Two times a day (BID) | ORAL | Status: DC
  Administered 2019-06-13: 04:00:00 25 mg via ORAL
  Filled 2019-06-13 (×2): qty 1

## 2019-06-13 MED ORDER — SODIUM BICARBONATE 650 MG PO TABS
650.0000 mg | ORAL_TABLET | Freq: Two times a day (BID) | ORAL | Status: DC
  Administered 2019-06-13 (×2): 650 mg via ORAL
  Filled 2019-06-13 (×2): qty 1

## 2019-06-13 MED ORDER — ACETAMINOPHEN 325 MG PO TABS
650.0000 mg | ORAL_TABLET | Freq: Once | ORAL | Status: AC
  Administered 2019-06-13: 650 mg via ORAL
  Filled 2019-06-13: qty 2

## 2019-06-13 MED ORDER — DARBEPOETIN ALFA 25 MCG/0.42ML IJ SOSY: 25.0000 ug | PREFILLED_SYRINGE | INTRAMUSCULAR | Status: DC

## 2019-06-13 MED ORDER — HYDROMORPHONE HCL 1 MG/ML IJ SOLN
1.0000 mg | INTRAMUSCULAR | Status: DC | PRN
  Administered 2019-06-13 (×3): 1 mg via INTRAVENOUS
  Filled 2019-06-13 (×3): qty 1

## 2019-06-13 MED ORDER — POLYETHYLENE GLYCOL 3350 17 G PO PACK: 17.0000 g | PACK | Freq: Every day | ORAL | Status: DC | PRN

## 2019-06-13 MED ORDER — OXYCODONE HCL 5 MG PO TABS
10.0000 mg | ORAL_TABLET | ORAL | Status: DC | PRN
  Administered 2019-06-13: 10 mg via ORAL
  Filled 2019-06-13: qty 2

## 2019-06-13 MED ORDER — OXYCODONE HCL ER 15 MG PO T12A
15.0000 mg | EXTENDED_RELEASE_TABLET | Freq: Two times a day (BID) | ORAL | Status: DC
  Administered 2019-06-13 (×2): 15 mg via ORAL
  Filled 2019-06-13 (×2): qty 1

## 2019-06-13 MED ORDER — VITAMIN D3 25 MCG (1000 UNIT) PO TABS
4000.0000 [IU] | ORAL_TABLET | Freq: Every day | ORAL | Status: DC
  Administered 2019-06-13: 09:00:00 4000 [IU] via ORAL
  Filled 2019-06-13 (×2): qty 4

## 2019-06-13 MED ORDER — DIPHENHYDRAMINE HCL 25 MG PO CAPS
25.0000 mg | ORAL_CAPSULE | Freq: Once | ORAL | Status: AC
  Administered 2019-06-13: 25 mg via ORAL
  Filled 2019-06-13: qty 1

## 2019-06-13 MED ORDER — SODIUM CHLORIDE 0.9% IV SOLUTION
Freq: Once | INTRAVENOUS | Status: AC
  Administered 2019-06-13: 10 mL/h via INTRAVENOUS

## 2019-06-13 NOTE — ED Notes (Signed)
After patient given pain medication, patient continues to be verbally aggressive with this RN. Patient states he is going to leave after getting pain medication and blood. Patient threatening this RN stating that if he does not get another nurse that I will not work another day.

## 2019-06-13 NOTE — Discharge Instructions (Signed)
Sickle Cell Anemia, Adult  Sickle cell anemia is a condition where your red blood cells are shaped like sickles. Red blood cells carry oxygen through the body. Sickle-shaped cells do not live as long as normal red blood cells. They also clump together and block blood from flowing through the blood vessels. This prevents the body from getting enough oxygen. Sickle cell anemia causes organ damage and pain. It also increases the risk of infection. Follow these instructions at home: Medicines  Take over-the-counter and prescription medicines only as told by your doctor.  If you were prescribed an antibiotic medicine, take it as told by your doctor. Do not stop taking the antibiotic even if you start to feel better.  If you develop a fever, do not take medicines to lower the fever right away. Tell your doctor about the fever. Managing pain, stiffness, and swelling  Try these methods to help with pain: ? Use a heating pad. ? Take a warm bath. ? Distract yourself, such as by watching TV. Eating and drinking  Drink enough fluid to keep your pee (urine) clear or pale yellow. Drink more in hot weather and during exercise.  Limit or avoid alcohol.  Eat a healthy diet. Eat plenty of fruits, vegetables, whole grains, and lean protein.  Take vitamins and supplements as told by your doctor. Traveling  When traveling, keep these with you: ? Your medical information. ? The names of your doctors. ? Your medicines.  If you need to take an airplane, talk to your doctor first. Activity  Rest often.  Avoid exercises that make your heart beat much faster, such as jogging. General instructions  Do not use products that have nicotine or tobacco, such as cigarettes and e-cigarettes. If you need help quitting, ask your doctor.  Consider wearing a medical alert bracelet.  Avoid being in high places (high altitudes), such as mountains.  Avoid very hot or cold temperatures.  Avoid places where the  temperature changes a lot.  Keep all follow-up visits as told by your doctor. This is important. Contact a doctor if:  A joint hurts.  Your feet or hands hurt or swell.  You feel tired (fatigued). Get help right away if:  You have symptoms of infection. These include: ? Fever. ? Chills. ? Being very tired. ? Irritability. ? Poor eating. ? Throwing up (vomiting).  You feel dizzy or faint.  You have new stomach pain, especially on the left side.  You have a an erection (priapism) that lasts more than 4 hours.  You have numbness in your arms or legs.  You have a hard time moving your arms or legs.  You have trouble talking.  You have pain that does not go away when you take medicine.  You are short of breath.  You are breathing fast.  You have a long-term cough.  You have pain in your chest.  You have a bad headache.  You have a stiff neck.  Your stomach looks bloated even though you did not eat much.  Your skin is pale.  You suddenly cannot see well. Summary  Sickle cell anemia is a condition where your red blood cells are shaped like sickles.  Follow your doctor's advice on ways to manage pain, food to eat, activities to do, and steps to take for safe travel.  Get medical help right away if you have any signs of infection, such as a fever. This information is not intended to replace advice given to you by   your health care provider. Make sure you discuss any questions you have with your health care provider. Document Revised: 05/24/2018 Document Reviewed: 03/07/2016 Elsevier Patient Education  2020 [employer]  

## 2019-06-13 NOTE — Care Management CC44 (Addendum)
Late entry 06/13/2019 2:45 pm CSW [place] in the room explained Code 59, pt was upset, verbally abusive to [place] and ready to leave. TOC CSW contacted CM to come and discuss form with patient. TOC CM spoke to pt at room. Pt agreeable to TOC CM signing form after explanation given by TOC CM on purpose of Medicare Code 44. Code 44 given to pt prior to dc from hospital. [place] ED TOC CM [phone]         Condition Code 44 Documentation Completed  Patient Details  Name: [name] [name]. MRN: [id number] Date of Birth: 1981-02-11   Condition Code 44 given:  Yes Patient signature on Condition Code 44 notice:  Yes Documentation of 2 MD's agreement:  Yes Code 44 added to claim:  Yes    Erenest Rasher, RN 06/13/2019, 2:59 pm

## 2019-06-13 NOTE — Care Management Obs Status (Addendum)
Late entry 06/13/2019 2:45 pm CSW [place] in the room explained Code 61, pt was upset, verbally abusive to [name] and ready to leave. TOC CSW contacted CM to come and discuss form with patient. TOC CM spoke to pt at room. Pt agreeable to TOC CM signing form after explanation given by TOC CM on purpose of Medicare Code 44. Code 44 given to pt prior to dc from hospital. Jonnie Finner RN CCM, [place] ED TOC CM [place]   Patient Details  Name: [name] [name]. MRN: [id number] Date of Birth: Mar 08, 1980   Medicare Observation Status Notification Given:  Yes    Erenest Rasher, RN 06/13/2019, 2:59 pm

## 2019-06-13 NOTE — Care Management Obs Status (Addendum)
Late entry 06/13/2019 2:45 pm CSW [place] in the room explained Code 70, pt was upset, verbally abusive to [place] and ready to leave. TOC CSW contacted CM to come and discuss form with patient. TOC CM spoke to pt at room. Pt agreeable to TOC CM signing form after explanation given by TOC CM on purpose of Medicare Code 44. Code 44 given to pt prior to dc from hospital. Jonnie Finner RN CCM, [place] ED TOC CM [place]   Patient Details  Name: [name] [name]. MRN: [id number] Date of Birth: [birth year]   Medicare Observation Status Notification Given:  Yes    Erenest Rasher, RN 06/13/2019, 2:59 pm

## 2019-06-13 NOTE — H&P (Signed)
History and Physical    [name] DUK:[id number] DOB: December 14, 1980 DOA: 06/12/2019  PCP: [name], MD   Patient coming from: Home  I have personally briefly reviewed patient's old medical records in [place]  Chief Complaint: Leg pain which is more on the left side compared to the right side, generalized body aches  HPI: [name] [name]. is a 39 y.o. male with medical history significant of sickle cell disease, chronic pain syndrome, opiate dependence and tolerance, CKD stage III, history of polysubstance abuse, anemia of chronic disease.  Patient presents to the ER with complaints of generalized body aches, pain in his bilateral lower extremities which is more on the left side.  He states that his left knee is hurting and slightly swollen.  He mentions that this happens when he has a sickle cell crisis.  He denies any falls or any injury to the left knee.  He states that the pain is 7/10 in severity in both the legs but he feels that the pain is more in the left knee and the left leg compared to the right side.  He denies any fevers, chills.  He denies any abdominal pain, chest pain, shortness of breath, coughing, sputum production.  Patient is on OxyContin and oxycodone at home.  He does mention that he was taken off his hydroxyurea recently and he has been placed on a epopoetin and injections.  He went to his nephrologist today and his hemoglobin was found to be 5.5 so he was referred to the ER for further evaluation.  Here in the ER his hemoglobin was 5.9.  His baseline hemoglobin is around 7-8.  He was last seen here on 06/04/2019 with a hemoglobin of 5.5 and he was transfused.  Patient complains of generalized weakness.  He denies any other symptoms.  ED Course: Patient was treated with IV fluids, pain medications, ordered 1 unit of PRBC in the ER.  Hemoglobin was 5.9.  His platelets are 34,000.  Absolute reticulocyte count was 18.1, immature reticulocyte  fraction was 9.5.  Review of Systems: Ten point review of systems reviewed  in detail and negative except as mentioned above in the HPI.   Past Medical History:  Diagnosis Date  . Abscess of right iliac fossa 09/24/2018   required Perc Drain 09/24/2018  . Arachnoid Cyst of brain bilaterally    "2 really small ones in the back of my head; inside; saw them w/MRI" (09/25/2012)  . Bacterial pneumonia ~ 2012   "caught it here in the hospital" (09/25/2012)  . Chronic kidney disease    "from my sickle cell" (09/25/2012)  . CKD (chronic kidney disease), stage II   . Colitis 04/19/2017   CT scan abd/ pelvis  . GERD (gastroesophageal reflux disease)    "after I eat alot of spicey foods" (09/25/2012)  . Gynecomastia, male 07/10/2012  . History of blood transfusion    "always related to sickle cell crisis" (09/25/2012)  . 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   Noted in noted from [facility] at [facility]  . Migraines    "take RX qd to prevent them" (09/25/2012)  . Opioid dependence with withdrawal ([place]) 08/30/2012  . Renal insufficiency   . Sickle cell anemia (HCC)   . Sickle cell crisis ([place]) 09/25/2012  . Sickle cell nephropathy ([place]) 07/10/2012  . Sinus tachycardia   . Tachycardia with heart rate 121-140 beats per minute with ambulation 08/04/2016    Past Surgical History:  Procedure  Laterality Date  . ABCESS DRAINAGE    . CHOLECYSTECTOMY  ~ 2012  . COLONOSCOPY N/A 04/23/2017   Procedure: COLONOSCOPY;  Surgeon: Irene Shipper, MD;  Location: [facility];  Service: Endoscopy;  Laterality: N/A;  . IR FLUORO GUIDE CV LINE RIGHT  12/17/2016  . IR RADIOLOGIST EVAL & MGMT  10/02/2018  . IR RADIOLOGIST EVAL & MGMT  10/15/2018  . IR REMOVAL TUN CV CATH W/O FL  12/21/2016  . IR US GUIDE VASC ACCESS RIGHT  12/17/2016  . spleenectomy      Social History  reports that he has been smoking cigarettes. He has been smoking about 0.10 packs per day. He has never used smokeless tobacco. He  reports current alcohol use. He reports current drug use. Drug: Marijuana.  Allergies  Allergen Reactions  . Nsaids Other (See Comments)    Kidney disease  . Morphine And Related Other (See Comments)    "Real bad headaches"     Family History  Problem Relation Age of Onset  . Breast cancer Mother       Prior to Admission medications   Medication Sig Start Date End Date Taking? Authorizing Provider  Cholecalciferol (VITAMIN D3) 50 MCG (2000 UT) TABS Take 2 tablets by mouth daily.  03/18/19  Yes [provider]  folic acid (FOLVITE) 1 MG tablet Take 1 tablet (1 mg total) by mouth daily. 03/11/19  Yes [name], FNP  ibuprofen (ADVIL) 200 MG tablet Take 200 mg by mouth every 6 (six) hours as needed for moderate pain.   Yes [provider]  nortriptyline (PAMELOR) 25 MG capsule Take 1 capsule (25 mg total) by mouth 2 (two) times daily. 04/15/19  Yes [name], FNP  oxyCODONE (OXYCONTIN) 15 mg 12 hr tablet Take 1 tablet (15 mg total) by mouth every 12 (twelve) hours. 06/08/19  Yes [name], FNP  Oxycodone HCl 10 MG TABS Take 1 tablet (10 mg total) by mouth every 6 (six) hours as needed. Patient taking differently: Take 10 mg by mouth every 6 (six) hours as needed (pain).  06/03/19  Yes [name], FNP  sodium bicarbonate 650 MG tablet Take 650 mg by mouth 2 (two) times daily. 11/07/18  Yes [provider]  acetaminophen (TYLENOL) 500 MG tablet Take 1 tablet (500 mg total) by mouth every 6 (six) hours as needed. Patient not taking: Reported on 06/12/2019 03/26/19   [name], FNP  gabapentin (NEURONTIN) 300 MG capsule Take 1 capsule (300 mg total) by mouth 2 (two) times daily as needed. Patient not taking: Reported on 06/12/2019 05/08/19   [name], FNP  hydroxyurea (HYDREA) 500 MG capsule TAKE 3 CAPSULES BY MOUTH EVERY DAY Patient not taking: Reported on 06/12/2019 12/24/18   [name], FNP  PROCRIT 32202 UNIT/ML  injection Inject 20,000 Units into the vein 3 (three) times a week.  05/27/19   [provider]    Physical Exam: Vitals:   06/13/19 0130 06/13/19 0145 06/13/19 0200 06/13/19 0230  BP: 104/72 (!) 103/53 109/72 102/68  Pulse: 86 74 84 81  Resp: 13 15 16 13   Temp:      TempSrc:      SpO2: 100% 100% 100% 99%    Constitutional: Patient lying in the bed in no acute distress, he is calm and comfortable Vitals:   06/13/19 0130 06/13/19 0145 06/13/19 0200 06/13/19 0230  BP: 104/72 (!) 103/53 109/72 102/68  Pulse: 86 74 84 81  Resp: 13 15 16 13   Temp:      TempSrc:      SpO2: 100% 100% 100% 99%   Eyes: PERRL, lids normal, No pallor, No icterus ENMT: Mucous membranes are moist. Posterior pharynx clear of any exudate or lesions.Normal dentition.  Neck: normal, supple, no masses, no thyromegaly Respiratory: clear to auscultation bilaterally, no wheezing, no crackles. Normal respiratory effort. No accessory muscle use.  Cardiovascular: S1 S2 Heard, rate and rhythm regular. No extremity edema. 2+ pedal pulses. No carotid bruits.  Abdomen: Soft, no tenderness, non distended, no masses palpated. Bowel sounds positive.  Musculoskeletal: no clubbing / cyanosis. No joint deformity upper and lower extremities. Good ROM, no contractures. Normal muscle tone.  The left knee is slightly swollen, not warm to touch, patient is able to bend his knee but he does say that it hurts with range of motion. Skin: warm and dry. no rashes noted on limited skin examination. Neurologic: CN 2-12 grossly intact. Sensation intact, DTR normal. Strength 5/5 in all 4.  Psychiatric: Normal judgment and insight. Alert and oriented x 3. Normal mood.    Labs on Admission: I have personally reviewed following labs and imaging studies  CBC: Recent Labs  Lab 06/12/19 1953  WBC 4.3  NEUTROABS 1.2*  HGB 5.9*  HCT 18.2*  MCV 94.3  PLT 34*    Basic Metabolic Panel: Recent Labs  Lab 06/12/19 1953  NA 139  K  4.4  CL 110  CO2 23  GLUCOSE 100*  BUN 31*  CREATININE 2.14*  CALCIUM 8.7*    GFR: Estimated Creatinine Clearance: 37.3 mL/min (A) (by C-G formula based on SCr of 2.14 mg/dL (H)).  Liver Function Tests: Recent Labs  Lab 06/12/19 1953  AST 37  ALT 33  ALKPHOS 282*  BILITOT 0.4  PROT 6.8  ALBUMIN 2.9*    Urine analysis:    Component Value Date/Time   COLORURINE YELLOW 05/06/2019 2043   APPEARANCEUR CLEAR 05/06/2019 2043   LABSPEC 1.008 05/06/2019 2043   PHURINE 7.0 05/06/2019 2043   GLUCOSEU 50 (A) 05/06/2019 2043   HGBUR NEGATIVE 05/06/2019 2043   HGBUR small 03/10/2010 1445   BILIRUBINUR neg 06/03/2019 1419   KETONESUR NEGATIVE 05/06/2019 2043   PROTEINUR Positive (A) 06/03/2019 1419   PROTEINUR >=300 (A) 05/06/2019 2043   UROBILINOGEN 0.2 06/03/2019 1419   UROBILINOGEN 0.2 05/11/2017 1335   NITRITE neg 06/03/2019 1419   NITRITE NEGATIVE 05/06/2019 2043   LEUKOCYTESUR Negative 06/03/2019 1419   LEUKOCYTESUR NEGATIVE 05/06/2019 2043    Radiological Exams on Admission: DG Chest 2 View  Result Date: 06/12/2019 CLINICAL DATA:  Swelling and discomfort EXAM: CHEST - 2 VIEW COMPARISON:  05/06/2019 FINDINGS: No acute consolidation or effusion. Small calcified lung nodules. Normal cardiomediastinal silhouette. No pneumothorax. Avascular necrosis of right humeral head as before. IMPRESSION: No active cardiopulmonary disease. Electronically Signed   By: Donavan Foil M.D.   On: 06/12/2019 19:17   DG Knee Complete 4 Views Left  Result Date: 06/12/2019 CLINICAL DATA:  Swelling and discomfort EXAM: LEFT KNEE - COMPLETE 4+ VIEW COMPARISON:  None. FINDINGS: No acute displaced fracture or malalignment. Medial joint space narrowing. Small moderate knee effusion. Subarticular sclerosis and lucency within the lateral femoral condyle. IMPRESSION: 1. Positive for knee effusion. 2. No acute fracture 3. Subarticular sclerosis and lucency at the lateral femoral condyle, possibly  representing an area of osteonecrosis. Electronically Signed   By: Donavan Foil M.D.   On: 06/12/2019 19:15    EKG: Independently  reviewed.  EKG was reviewed by me, shows sinus rhythm, no acute ST elevations or ST depressions as read by me.  Assessment/Plan Active Problems:   Sickle cell crisis (HCC)   Sickle cell disease with pain crisis, POA -Admit the patient to the medical floor -Aggressive IV fluid hydration with half-normal saline at 150 cc/hr -Dilaudid 1 mg IV every 3 hours as needed for breakthrough pain -OxyContin as long-acting pain medication -As per the patient his hydroxyurea was stopped in the outpatient setting -Continue multivitamins and folic acid  Acute on chronic anemia secondary to sickle cell crisis, POA -Hemoglobin of 5.9 -We will transfuse 1 unit of PRBC, recheck hemoglobin and if still less than 7 then he might need 1 more unit of PRBC  Left knee pain and swelling, likely due to sickle cell crisis, POA -Continue to monitor him closely -If gets worse might consider imaging the left knee -Pain medications as needed as mentioned above  Stage III kidney disease, chronic, POA -Stable, avoid any nephrotoxic medications  DVT prophylaxis: SCDs  Code Status: Full code Family Communication: No family at the bedside Disposition Plan: Home Consults called: None Admission status: Inpatient, MedSurg  Severity of Illness: The appropriate patient status for this patient is INPATIENT. Inpatient status is judged to be reasonable and necessary in order to provide the required intensity of service to ensure the patient's safety. The patient's presenting symptoms, physical exam findings, and initial radiographic and laboratory data in the context of their chronic comorbidities is felt to place them at high risk for further clinical deterioration. Furthermore, it is not anticipated that the patient will be medically stable for discharge from the hospital within 2 midnights of  admission. The following factors support the patient status of inpatient.   " The patient's presenting symptoms include body aches, sickle cell crisis, anemia. " The worrisome physical exam findings include left knee pain and swelling " The initial radiographic and laboratory data are worrisome because of hemoglobin of 5.9, increased absolute reticulocyte count " The chronic co-morbidities include sickle cell disease   * I certify that at the point of admission it is my clinical judgment that the patient will require inpatient hospital care spanning beyond 2 midnights from the point of admission due to high intensity of service, high risk for further deterioration and high frequency of surveillance required.Jenkins Rouge MD [facility]  How to contact the [facility] Attending or Consulting provider [place] or covering provider during after hours [place], for this patient?   1. Check the care team in [facility] and look for a) attending/consulting [facility] provider listed and b) the [facility] team listed 2. Log into [email or web address] and use [place]'s universal password to access. If you do not have the password, please contact the hospital operator. 3. Locate the [facility] provider you are looking for under [facility] and page to a number that you can be directly reached. 4. If you still have difficulty reaching the provider, please page the [facility] (Director on Call) for the Hospitalists listed on amion for assistance.  06/13/2019, 2:56 AM

## 2019-06-13 NOTE — ED Notes (Signed)
Patient continuously aggressive and threatening with this RN. Patient stating "If you don't get my pain medicine I will have your job" and "this is going to be your last day of work you stupid bitch." Patient continuously telling RN to "shut the fuck up" while this RN attempts to obtain pain score.

## 2019-06-13 NOTE — ED Notes (Signed)
This RN attempted to start blood transfusion and obtain pre-transfusion vital signs. Upon entering the room, patient became increasingly aggressive with RN and continuously yelled, "Fuck some vitals, I'm not doing anything until I get my pain medicine." This RN attempted to de-escalate patient and explain that blood products needed to be done and then I will obtain pain medications. Charge RN called to room to speak with patient.

## 2019-06-13 NOTE — Discharge Summary (Addendum)
Physician Discharge Summary  [name]. UQJ:[id number] DOB: 1981-01-29 DOA: 06/12/2019  PCP: [name], MD  Admit date: 06/12/2019  Discharge date: 06/13/2019  Discharge Diagnoses:  Active Problems:   Sickle cell crisis [facility])   Discharge Condition: Stable  Disposition:  Follow-up Information    [name], FNP Follow up.   Specialty: Family Medicine Why: Tuesday May 4th at 8:00 for procrit injection Contact information: [address] [address] [phone]          Pt is discharged home in good condition and is to follow up with [name], MD this week to have labs evaluated. Solan A Abee Jr. is instructed to increase activity slowly and balance with rest for the next few days, and use prescribed medication to complete treatment of pain  Diet: Regular Wt Readings from Last 3 Encounters:  06/13/19 55.3 kg  06/03/19 55.3 kg  05/06/19 54.1 kg    History of present illness:  Chadrick Sprinkle, a 39 year old male with a medical history significant for sickle cell disease, chronic pain syndrome, opiate dependence and tolerance, CKD stage III, history of polysubstance abuse, and history of anemia of chronic disease presented to the ER with complaints of generalized body aches, pain in his bilateral lower extremities, which is greater on the left side.  He states that his left knee is hurting and is slightly swollen.  He mentions that this happens when he has a sickle cell crisis.  He denies any falls or injuries to the left knee.  He says the pain intensity is 7/10 in severity in both legs, but he feels like the pain is primarily in the left knee when compared to the right side.  He denies any fevers, chills.  He also denies abdominal pain, chest pain, shortness of breath, coughing, or sputum production.  Patient is on OxyContin and oxycodone at home.  He does mention that he was taken off of his hydroxyurea recently  and has been placed on a darbepoetin injections.  He went to his nephrologist today and his hemoglobin was found to be 5.5.  Patient was referred to the ER for further evaluation.  Here in the ER, hemoglobin was 5.9.  His baseline hemoglobin is around 7-8.  He was seen here on 06/04/2019 with a hemoglobin of 5.5 and at that time he was transfused.  Patient complains of some weakness.  He denies any other symptoms.   ER course: Patient was treated with IV fluids, pain medications, and ordered 1 unit of PRBCs.  Hemoglobin was 5.9.  Platelets are around 34,000.  Absolute reticulocyte count count was 18.1, immature reticulocyte fraction was 9.5.    Hospital Course:  Sickle cell pain crisis:  Patient was admitted for symptomatic anemia in the setting of sickle cell pain crisis.  Pain intensity was found to be 7/10 despite home medications.  Patient is requesting discharge, he states that pain intensity has improved significantly.  He rates pain as 4-5.  Patient was treated appropriately with IV fluids, IV Dilaudid, OxyContin, and oxycodone.  Patient advised to resume all home medications.  He will follow-up with this provider on Tuesday, May 4th at 8 AM.  Patient advised to discontinue hydroxyurea per hematologist.  Patient is scheduled to receive darbepoetin injections weekly and will receive that injection at upcoming appointment.  Symptomatic anemia:  In the ER, patient's hemoglobin was 5.9 which was below his baseline.  Patient is followed by hematology  and hemoglobin has been consistently decreased over the past several months.  Hydroxyurea was discontinued.  Patient to receive weekly darbepoetin injections.  In the ER, patient endorsed fatigue and was transfused 2 units PRBCs.  Hemoglobin 9.4 prior to discharge.  During admission, patient found to have a platelet count of around 34,000.  A fractionated platelet analysis was obtained which showed an elevated IPF, which is indicative of elevated platelet  production. Discontinue hydroxyurea. Patient advised to follow-up with hematologist monthly as scheduled.  Patient is alert, oriented, and ambulating without assistance.  Oxygen saturations 100% on RA.  He is aware of all upcoming appointments.  Also, patient advised to continue to avoid nephrotoxic medications.  He is to resume his home medication regimen. Patient will discharge home in a hemodynamically stable condition. Discharge Exam: Vitals:   06/13/19 1330 06/13/19 1400  BP:  104/66  Pulse: 73 67  Resp: 11 15  Temp:    SpO2: 100% 100%   Vitals:   06/13/19 1230 06/13/19 1300 06/13/19 1330 06/13/19 1400  BP: 98/70   104/66  Pulse: 82 74 73 67  Resp: 14 20 11 15   Temp:      TempSrc:      SpO2: 100% 100% 100% 100%  Weight:      Height:       Physical Exam Constitutional:      Appearance: Normal appearance.  HENT:     Head: Normocephalic.  Eyes:     Pupils: Pupils are equal, round, and reactive to light.  Cardiovascular:     Rate and Rhythm: Normal rate and regular rhythm.     Heart sounds: Murmur present.  Pulmonary:     Effort: Pulmonary effort is normal.  Abdominal:     General: Abdomen is flat. Bowel sounds are normal.  Musculoskeletal:        General: Normal range of motion.  Skin:    General: Skin is warm.  Neurological:     General: No focal deficit present.     Mental Status: He is alert. Mental status is at baseline.  Psychiatric:        Mood and Affect: Mood normal.        Thought Content: Thought content normal.        Judgment: Judgment normal.     Discharge Instructions  Discharge Instructions    Discharge patient   Complete by: As directed    Discharge disposition: 01-Home or Self Care   Discharge patient date: 06/13/2019     Allergies as of 06/13/2019      Reactions   Nsaids Other (See Comments)   Kidney disease   Morphine And Related Other (See Comments)   "Real bad headaches"       Medication List    STOP taking these medications    hydroxyurea 500 MG capsule Commonly known as: HYDREA   ibuprofen 200 MG tablet Commonly known as: ADVIL     TAKE these medications   acetaminophen 500 MG tablet Commonly known as: TYLENOL Take 1 tablet (500 mg total) by mouth every 6 (six) hours as needed.   folic acid 1 MG tablet Commonly known as: FOLVITE Take 1 tablet (1 mg total) by mouth daily.   gabapentin 300 MG capsule Commonly known as: NEURONTIN Take 1 capsule (300 mg total) by mouth 2 (two) times daily as needed.   nortriptyline 25 MG capsule Commonly known as: PAMELOR Take 1 capsule (25 mg total) by mouth 2 (two) times daily.  Oxycodone HCl 10 MG Tabs Take 1 tablet (10 mg total) by mouth every 6 (six) hours as needed. What changed: reasons to take this   oxyCODONE 15 mg 12 hr tablet Commonly known as: OxyCONTIN Take 1 tablet (15 mg total) by mouth every 12 (twelve) hours. What changed: Another medication with the same name was changed. Make sure you understand how and when to take each.   Procrit 20000 UNIT/ML injection Generic drug: epoetin alfa Inject 20,000 Units into the vein 3 (three) times a week.   sodium bicarbonate 650 MG tablet Take 650 mg by mouth 2 (two) times daily.   Vitamin D3 50 MCG (2000 UT) Tabs Take 2 tablets by mouth daily.       The results of significant diagnostics from this hospitalization (including imaging, microbiology, ancillary and laboratory) are listed below for reference.    Significant Diagnostic Studies: DG Chest 2 View  Result Date: 06/12/2019 CLINICAL DATA:  Swelling and discomfort EXAM: CHEST - 2 VIEW COMPARISON:  05/06/2019 FINDINGS: No acute consolidation or effusion. Small calcified lung nodules. Normal cardiomediastinal silhouette. No pneumothorax. Avascular necrosis of right humeral head as before. IMPRESSION: No active cardiopulmonary disease. Electronically Signed   By: Donavan Foil M.D.   On: 06/12/2019 19:17   DG Knee Complete 4 Views Left  Result  Date: 06/12/2019 CLINICAL DATA:  Swelling and discomfort EXAM: LEFT KNEE - COMPLETE 4+ VIEW COMPARISON:  None. FINDINGS: No acute displaced fracture or malalignment. Medial joint space narrowing. Small moderate knee effusion. Subarticular sclerosis and lucency within the lateral femoral condyle. IMPRESSION: 1. Positive for knee effusion. 2. No acute fracture 3. Subarticular sclerosis and lucency at the lateral femoral condyle, possibly representing an area of osteonecrosis. Electronically Signed   By: Donavan Foil M.D.   On: 06/12/2019 19:15    Microbiology: Recent Results (from the past 240 hour(s))  Respiratory Panel by RT PCR (Flu A&B, Covid) - Nasopharyngeal Swab     Status: None   Collection Time: 06/12/19 11:00 PM   Specimen: Nasopharyngeal Swab  Result Value Ref Range Status   SARS Coronavirus 2 by RT PCR NEGATIVE NEGATIVE Final    Comment: (NOTE) SARS-CoV-2 target nucleic acids are NOT DETECTED. The SARS-CoV-2 RNA is generally detectable in upper respiratoy specimens during the acute phase of infection. The lowest concentration of SARS-CoV-2 viral copies this assay can detect is 131 copies/mL. A negative result does not preclude SARS-Cov-2 infection and should not be used as the sole basis for treatment or other patient management decisions. A negative result may occur with  improper specimen collection/handling, submission of specimen other than nasopharyngeal swab, presence of viral mutation(s) within the areas targeted by this assay, and inadequate number of viral copies (<131 copies/mL). A negative result must be combined with clinical observations, patient history, and epidemiological information. The expected result is Negative. Fact Sheet for Patients:  PinkCheek.be Fact Sheet for Healthcare Providers:  GravelBags.it This test is not yet ap proved or cleared by the [place] FDA and  has been authorized for  detection and/or diagnosis of SARS-CoV-2 by FDA under an Emergency Use Authorization (EUA). This EUA will remain  in effect (meaning this test can be used) for the duration of the COVID-19 declaration under Section 564(b)(1) of the Act, 21 U.S.C. section 360bbb-3(b)(1), unless the authorization is terminated or revoked sooner.    Influenza A by PCR NEGATIVE NEGATIVE Final   Influenza B by PCR NEGATIVE NEGATIVE Final    Comment: (NOTE) The Xpert Xpress  SARS-CoV-2/FLU/RSV assay is intended as an aid in  the diagnosis of influenza from Nasopharyngeal swab specimens and  should not be used as a sole basis for treatment. Nasal washings and  aspirates are unacceptable for Xpert Xpress SARS-CoV-2/FLU/RSV  testing. Fact Sheet for Patients: PinkCheek.be Fact Sheet for Healthcare Providers: GravelBags.it This test is not yet approved or cleared by the [place] FDA and  has been authorized for detection and/or diagnosis of SARS-CoV-2 by  FDA under an Emergency Use Authorization (EUA). This EUA will remain  in effect (meaning this test can be used) for the duration of the  Covid-19 declaration under Section 564(b)(1) of the Act, 21  U.S.C. section 360bbb-3(b)(1), unless the authorization is  terminated or revoked. Performed at [facility], [place] [address], [place], [place] [address]      Labs: Basic Metabolic Panel: Recent Labs  Lab 06/12/19 1953  NA 139  K 4.4  CL 110  CO2 23  GLUCOSE 100*  BUN 31*  CREATININE 2.14*  CALCIUM 8.7*   Liver Function Tests: Recent Labs  Lab 06/12/19 1953  AST 37  ALT 33  ALKPHOS 282*  BILITOT 0.4  PROT 6.8  ALBUMIN 2.9*   No results for input(s): LIPASE, AMYLASE in the last 168 hours. No results for input(s): AMMONIA in the last 168 hours. CBC: Recent Labs  Lab 06/12/19 1953 06/13/19 0629 06/13/19 1257  WBC 4.3 4.1  --   NEUTROABS 1.2* 0.8*  --   HGB  5.9* 6.7* 9.4*  HCT 18.2* 21.0* 28.6*  MCV 94.3 92.1  --   PLT 34* 35*  --    Cardiac Enzymes: No results for input(s): CKTOTAL, CKMB, CKMBINDEX, TROPONINI in the last 168 hours. BNP: Invalid input(s): POCBNP CBG: No results for input(s): GLUCAP in the last 168 hours.  Time coordinating discharge: 30 minutes  Signed:  [name]  APRN, MSN, FNP-C [facility] [address] [place], [place] [address] [phone]  [facility] 06/13/2019, 2:42 PM

## 2019-06-14 LAB — TYPE AND SCREEN
ABO/RH(D): O POS
Antibody Screen: NEGATIVE
Donor AG Type: NEGATIVE
Unit division: 0
Unit division: 0

## 2019-06-14 LAB — BPAM RBC
Blood Product Expiration Date: 202105222359
Blood Product Expiration Date: 202105252359
ISSUE DATE / TIME: 202104300051
ISSUE DATE / TIME: 202104300737
Unit Type and Rh: 9500
Unit Type and Rh: 9500

## 2019-06-17 ENCOUNTER — Other Ambulatory Visit: Payer: Self-pay

## 2019-06-17 ENCOUNTER — Other Ambulatory Visit: Payer: Self-pay | Admitting: Family Medicine

## 2019-06-17 ENCOUNTER — Ambulatory Visit (HOSPITAL_COMMUNITY)
Admission: RE | Admit: 2019-06-17 | Discharge: 2019-06-17 | Disposition: A | Discharge status: Home / Self Care | Payer: Medicare Other | Source: Ambulatory Visit | Attending: Internal Medicine | Admitting: Internal Medicine

## 2019-06-17 DIAGNOSIS — D571 Sickle-cell disease without crisis: Secondary | ICD-10-CM | POA: Diagnosis not present

## 2019-06-17 LAB — CBC WITH DIFFERENTIAL/PLATELET
Abs Immature Granulocytes: 0.01 10*3/uL (ref 0.00–0.07)
Basophils Absolute: 0 10*3/uL (ref 0.0–0.1)
Basophils Relative: 0 %
Eosinophils Absolute: 0.1 10*3/uL (ref 0.0–0.5)
Eosinophils Relative: 1 %
HCT: 29.7 % — ABNORMAL LOW (ref 39.0–52.0)
Hemoglobin: 9.5 g/dL — ABNORMAL LOW (ref 13.0–17.0)
Immature Granulocytes: 0 %
Lymphocytes Relative: 67 %
Lymphs Abs: 3.1 10*3/uL (ref 0.7–4.0)
MCH: 30.4 pg (ref 26.0–34.0)
MCHC: 32 g/dL (ref 30.0–36.0)
MCV: 94.9 fL (ref 80.0–100.0)
Monocytes Absolute: 0.4 10*3/uL (ref 0.1–1.0)
Monocytes Relative: 8 %
Neutro Abs: 1.1 10*3/uL — ABNORMAL LOW (ref 1.7–7.7)
Neutrophils Relative %: 24 %
Platelets: 115 10*3/uL — ABNORMAL LOW (ref 150–400)
RBC: 3.13 MIL/uL — ABNORMAL LOW (ref 4.22–5.81)
RDW: 17.9 % — ABNORMAL HIGH (ref 11.5–15.5)
WBC: 4.6 10*3/uL (ref 4.0–10.5)
nRBC: 0.7 % — ABNORMAL HIGH (ref 0.0–0.2)

## 2019-06-17 MED ORDER — EPOETIN ALFA 20000 UNIT/ML IJ SOLN
20000.0000 [IU] | Freq: Once | INTRAMUSCULAR | Status: DC
  Filled 2019-06-17: qty 1

## 2019-06-17 NOTE — Progress Notes (Signed)
PATIENT CARE CENTER NOTE  Diagnosis: Sickle Cell Anemia    Provider: Holl[place], [place], FNP   Procedure: Lab draw (CBC w/diff)   Note: Patient's labs drawn for possible Epogen injection. Per provider,Epogen injection is to be given for Hemoglobin > 8. Patient's Hemoglobin 9.5 so patient did not receive injection.  Alert, oriented and ambulatory at discharge.

## 2019-06-25 ENCOUNTER — Other Ambulatory Visit: Payer: Self-pay | Admitting: Family Medicine

## 2019-06-25 DIAGNOSIS — G894 Chronic pain syndrome: Secondary | ICD-10-CM

## 2019-06-25 DIAGNOSIS — Z8669 Personal history of other diseases of the nervous system and sense organs: Secondary | ICD-10-CM

## 2019-06-26 DIAGNOSIS — Z23 Encounter for immunization: Secondary | ICD-10-CM | POA: Diagnosis not present

## 2019-06-27 ENCOUNTER — Emergency Department (HOSPITAL_COMMUNITY): Payer: Medicare Other

## 2019-06-27 ENCOUNTER — Inpatient Hospital Stay (HOSPITAL_COMMUNITY)
Admission: EM | Admit: 2019-06-27 | Discharge: 2019-06-30 | DRG: 812 | Disposition: A | Discharge status: Home / Self Care | Payer: Medicare Other | Attending: Internal Medicine | Admitting: Internal Medicine

## 2019-06-27 ENCOUNTER — Encounter (HOSPITAL_COMMUNITY): Payer: Self-pay | Admitting: Emergency Medicine

## 2019-06-27 DIAGNOSIS — G894 Chronic pain syndrome: Secondary | ICD-10-CM | POA: Diagnosis present

## 2019-06-27 DIAGNOSIS — D5700 Hb-SS disease with crisis, unspecified: Principal | ICD-10-CM | POA: Diagnosis present

## 2019-06-27 DIAGNOSIS — N184 Chronic kidney disease, stage 4 (severe): Secondary | ICD-10-CM | POA: Diagnosis present

## 2019-06-27 DIAGNOSIS — F17210 Nicotine dependence, cigarettes, uncomplicated: Secondary | ICD-10-CM | POA: Diagnosis present

## 2019-06-27 DIAGNOSIS — Z9049 Acquired absence of other specified parts of digestive tract: Secondary | ICD-10-CM | POA: Diagnosis not present

## 2019-06-27 DIAGNOSIS — Z79891 Long term (current) use of opiate analgesic: Secondary | ICD-10-CM | POA: Diagnosis not present

## 2019-06-27 DIAGNOSIS — D638 Anemia in other chronic diseases classified elsewhere: Secondary | ICD-10-CM | POA: Diagnosis present

## 2019-06-27 DIAGNOSIS — D571 Sickle-cell disease without crisis: Secondary | ICD-10-CM

## 2019-06-27 DIAGNOSIS — R000 Tachycardia, unspecified: Secondary | ICD-10-CM | POA: Diagnosis not present

## 2019-06-27 DIAGNOSIS — Z20822 Contact with and (suspected) exposure to covid-19: Secondary | ICD-10-CM | POA: Diagnosis present

## 2019-06-27 DIAGNOSIS — K219 Gastro-esophageal reflux disease without esophagitis: Secondary | ICD-10-CM | POA: Diagnosis present

## 2019-06-27 DIAGNOSIS — R079 Chest pain, unspecified: Secondary | ICD-10-CM | POA: Diagnosis not present

## 2019-06-27 LAB — CBC WITH DIFFERENTIAL/PLATELET
Abs Immature Granulocytes: 0.07 10*3/uL (ref 0.00–0.07)
Basophils Absolute: 0 10*3/uL (ref 0.0–0.1)
Basophils Relative: 0 %
Eosinophils Absolute: 0 10*3/uL (ref 0.0–0.5)
Eosinophils Relative: 0 %
HCT: 32.5 % — ABNORMAL LOW (ref 39.0–52.0)
Hemoglobin: 10.3 g/dL — ABNORMAL LOW (ref 13.0–17.0)
Immature Granulocytes: 1 %
Lymphocytes Relative: 23 %
Lymphs Abs: 2.4 10*3/uL (ref 0.7–4.0)
MCH: 31.4 pg (ref 26.0–34.0)
MCHC: 31.7 g/dL (ref 30.0–36.0)
MCV: 99.1 fL (ref 80.0–100.0)
Monocytes Absolute: 1.1 10*3/uL — ABNORMAL HIGH (ref 0.1–1.0)
Monocytes Relative: 10 %
Neutro Abs: 7.1 10*3/uL (ref 1.7–7.7)
Neutrophils Relative %: 66 %
Platelets: 465 10*3/uL — ABNORMAL HIGH (ref 150–400)
RBC: 3.28 MIL/uL — ABNORMAL LOW (ref 4.22–5.81)
RDW: 21.2 % — ABNORMAL HIGH (ref 11.5–15.5)
WBC: 10.7 10*3/uL — ABNORMAL HIGH (ref 4.0–10.5)
nRBC: 0.2 % (ref 0.0–0.2)

## 2019-06-27 LAB — LACTIC ACID, PLASMA: Lactic Acid, Venous: 0.9 mmol/L (ref 0.5–1.9)

## 2019-06-27 LAB — COMPREHENSIVE METABOLIC PANEL
ALT: 25 U/L (ref 0–44)
AST: 38 U/L (ref 15–41)
Albumin: 3.2 g/dL — ABNORMAL LOW (ref 3.5–5.0)
Alkaline Phosphatase: 289 U/L — ABNORMAL HIGH (ref 38–126)
Anion gap: 8 (ref 5–15)
BUN: 18 mg/dL (ref 6–20)
CO2: 19 mmol/L — ABNORMAL LOW (ref 22–32)
Calcium: 8.7 mg/dL — ABNORMAL LOW (ref 8.9–10.3)
Chloride: 107 mmol/L (ref 98–111)
Creatinine, Ser: 2.28 mg/dL — ABNORMAL HIGH (ref 0.61–1.24)
GFR calc Af Amer: 41 mL/min — ABNORMAL LOW (ref >60)
GFR calc non Af Amer: 36 mL/min — ABNORMAL LOW (ref >60)
Glucose, Bld: 100 mg/dL — ABNORMAL HIGH (ref 70–99)
Potassium: 4.4 mmol/L (ref 3.5–5.1)
Sodium: 134 mmol/L — ABNORMAL LOW (ref 135–145)
Total Bilirubin: 0.5 mg/dL (ref 0.3–1.2)
Total Protein: 7.1 g/dL (ref 6.5–8.1)

## 2019-06-27 LAB — SARS CORONAVIRUS 2 BY RT PCR (HOSPITAL ORDER, PERFORMED IN ~~LOC~~ HOSPITAL LAB): SARS Coronavirus 2: NEGATIVE

## 2019-06-27 LAB — RETICULOCYTES
Immature Retic Fract: 30.4 % — ABNORMAL HIGH (ref 2.3–15.9)
RBC.: 3.3 MIL/uL — ABNORMAL LOW (ref 4.22–5.81)
Retic Count, Absolute: 127.1 10*3/uL (ref 19.0–186.0)
Retic Ct Pct: 3.9 % — ABNORMAL HIGH (ref 0.4–3.1)

## 2019-06-27 MED ORDER — HYDROMORPHONE HCL 1 MG/ML IJ SOLN
1.0000 mg | Freq: Once | INTRAMUSCULAR | Status: AC
  Administered 2019-06-27: 1 mg via INTRAVENOUS
  Filled 2019-06-27: qty 1

## 2019-06-27 MED ORDER — SENNOSIDES-DOCUSATE SODIUM 8.6-50 MG PO TABS
1.0000 | ORAL_TABLET | Freq: Two times a day (BID) | ORAL | Status: DC
  Filled 2019-06-27 (×3): qty 1

## 2019-06-27 MED ORDER — SODIUM CHLORIDE 0.9% FLUSH: 9.0000 mL | INTRAVENOUS | Status: DC | PRN

## 2019-06-27 MED ORDER — DEXTROSE-NACL 5-0.45 % IV SOLN: INTRAVENOUS | Status: AC

## 2019-06-27 MED ORDER — ENOXAPARIN SODIUM 40 MG/0.4ML ~~LOC~~ SOLN
40.0000 mg | Freq: Every day | SUBCUTANEOUS | Status: DC
  Administered 2019-06-27 – 2019-06-28 (×2): 40 mg via SUBCUTANEOUS
  Filled 2019-06-27 (×3): qty 0.4

## 2019-06-27 MED ORDER — HYDROMORPHONE 1 MG/ML IV SOLN
INTRAVENOUS | Status: DC
  Administered 2019-06-28: 30 mg via INTRAVENOUS
  Filled 2019-06-27: qty 30

## 2019-06-27 MED ORDER — NALOXONE HCL 0.4 MG/ML IJ SOLN: 0.4000 mg | INTRAMUSCULAR | Status: DC | PRN

## 2019-06-27 MED ORDER — POLYETHYLENE GLYCOL 3350 17 G PO PACK: 17.0000 g | PACK | Freq: Every day | ORAL | Status: DC | PRN

## 2019-06-27 MED ORDER — HYDROMORPHONE HCL 1 MG/ML IJ SOLN
1.0000 mg | INTRAMUSCULAR | Status: AC | PRN
  Administered 2019-06-27 – 2019-06-28 (×4): 1 mg via INTRAVENOUS
  Filled 2019-06-27 (×4): qty 1

## 2019-06-27 MED ORDER — HYDROMORPHONE HCL 2 MG/ML IJ SOLN: 2.0000 mg | Freq: Once | INTRAMUSCULAR | Status: DC

## 2019-06-27 NOTE — H&P (Signed)
History and Physical    Nesanel A [employer]. MGQ:[id number] DOB: [birth year] DOA: 06/27/2019  PCP: [name], MD  Patient coming from: Home.  Chief Complaint: Pain.  HPI: German Manke. is a 39 y.o. male with history of sickle cell anemia and chronic kidney disease stage III presents to the ER with complaints of having generalized body ache over the last 24 hours.  Denies any productive cough chest pain nausea vomiting or diarrhea.  Denies any headache or visual symptoms.  Patient received a second dose of COVID-19 vaccination 48 hours ago.  ED Course: In the ER patient was febrile with temperature of 100.4 heart rate of 118/min and patient had blood cultures drawn and chest x-ray done which did not show the acute will order UA.  No definite signs of any definite infection.  Patient was not hypoxic.  Covid test was negative.  Lab work showed WBC of 10.7 creatinine 2.2 LFTs were normal.  Lactic acid was normal.  Patient admitted for sickle cell pain crisis.  Review of Systems: As per HPI, rest all negative.   Past Medical History:  Diagnosis Date  . Abscess of right iliac fossa 09/24/2018   required Perc Drain 09/24/2018  . Arachnoid Cyst of brain bilaterally    "2 really small ones in the back of my head; inside; saw them w/MRI" (09/25/2012)  . Bacterial pneumonia ~ 2012   "caught it here in the hospital" (09/25/2012)  . Chronic kidney disease    "from my sickle cell" (09/25/2012)  . CKD (chronic kidney disease), stage II   . Colitis 04/19/2017   CT scan abd/ pelvis  . GERD (gastroesophageal reflux disease)    "after I eat alot of spicey foods" (09/25/2012)  . Gynecomastia, male 07/10/2012  . History of blood transfusion    "always related to sickle cell crisis" (09/25/2012)  . 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 Migraines    "take RX qd to prevent them" (09/25/2012)  . Opioid dependence with  withdrawal ([place]) 08/30/2012  . Renal insufficiency   . Sickle cell anemia (H[facility])   . Sickle cell crisis ([place]) 09/25/2012  . Sickle cell nephropathy ([place]) 07/10/2012  . Sinus tachycardia   . Tachycardia with heart rate 121-140 beats per minute with ambulation 08/04/2016    Past Surgical History:  Procedure Laterality Date  . ABCESS DRAINAGE    . CHOLECYSTECTOMY  ~ 2012  . COLONOSCOPY N/A 04/23/2017   Procedure: COLONOSCOPY;  Surgeon: Irene Shipper, MD;  Location: [facility];  Service: Endoscopy;  Laterality: N/A;  . IR FLUORO GUIDE CV LINE RIGHT  12/17/2016  . IR RADIOLOGIST EVAL & MGMT  10/02/2018  . IR RADIOLOGIST EVAL & MGMT  10/15/2018  . IR REMOVAL TUN CV CATH W/O FL  12/21/2016  . IR US GUIDE VASC ACCESS RIGHT  12/17/2016  . spleenectomy       reports that he has been smoking cigarettes. He has been smoking about 0.10 packs per day. He has never used smokeless tobacco. He reports current alcohol use. He reports current drug use. Drug: Marijuana.  Allergies  Allergen Reactions  . Nsaids Other (See Comments)    Kidney disease  . Morphine And Related Other (See Comments)    "Real bad headaches"     Family History  Problem Relation Age of Onset  . Breast cancer Mother     Prior to Admission medications  Medication Sig Start Date End Date Taking? Authorizing Provider  Cholecalciferol (VITAMIN D3) 50 MCG (2000 UT) TABS Take 2 tablets by mouth daily.  03/18/19  Yes [provider]  folic acid (FOLVITE) 1 MG tablet Take 1 tablet (1 mg total) by mouth daily. 03/11/19  Yes [name], FNP  nortriptyline (PAMELOR) 25 MG capsule Take 1 capsule (25 mg total) by mouth 2 (two) times daily. 04/15/19  Yes [name], FNP  oxyCODONE (OXYCONTIN) 15 mg 12 hr tablet Take 1 tablet (15 mg total) by mouth every 12 (twelve) hours. 06/08/19  Yes [name], FNP  Oxycodone HCl 10 MG TABS Take 1 tablet (10 mg total) by mouth every 6 (six) hours as needed. Patient taking  differently: Take 10 mg by mouth every 6 (six) hours as needed (pain).  06/03/19  Yes [name], FNP  sodium bicarbonate 650 MG tablet Take 650 mg by mouth 2 (two) times daily. 11/07/18  Yes [provider]  acetaminophen (TYLENOL) 500 MG tablet Take 1 tablet (500 mg total) by mouth every 6 (six) hours as needed. Patient not taking: Reported on 06/12/2019 03/26/19   [name], FNP  gabapentin (NEURONTIN) 300 MG capsule Take 1 capsule (300 mg total) by mouth 2 (two) times daily as needed. Patient not taking: Reported on 06/12/2019 05/08/19   [name], FNP    Physical Exam: Constitutional: Moderately built and nourished. Vitals:   06/27/19 2030 06/27/19 2128 06/27/19 2130 06/27/19 2200  BP: 122/88 118/83 116/86 115/75  Pulse: 99 (!) 104 94 (!) 105  Resp: 17 (!) 23 20 15   Temp:      TempSrc:      SpO2: 100% 100% 100% 100%   Eyes: Anicteric no pallor. ENMT: No discharge from the ears eyes nose or mouth. Neck: No mass felt.  No neck rigidity. Respiratory: No rhonchi or crepitations. Cardiovascular: S1-S2 heard. Abdomen: Soft nontender bowel sounds present. Musculoskeletal: No edema. Skin: No rash. Neurologic: Alert awake oriented time place and person.  Moves all extremities. Psychiatric: Appears normal per normal affect.   Labs on Admission: I have personally reviewed following labs and imaging studies  CBC: Recent Labs  Lab 06/27/19 1632  WBC 10.7*  NEUTROABS 7.1  HGB 10.3*  HCT 32.5*  MCV 99.1  PLT 466*   Basic Metabolic Panel: Recent Labs  Lab 06/27/19 1632  NA 134*  K 4.4  CL 107  CO2 19*  GLUCOSE 100*  BUN 18  CREATININE 2.28*  CALCIUM 8.7*   GFR: CrCl cannot be calculated (Unknown ideal weight.). Liver Function Tests: Recent Labs  Lab 06/27/19 1632  AST 38  ALT 25  ALKPHOS 289*  BILITOT 0.5  PROT 7.1  ALBUMIN 3.2*   No results for input(s): LIPASE, AMYLASE in the last 168 hours. No results for input(s): AMMONIA in  the last 168 hours. Coagulation Profile: No results for input(s): INR, PROTIME in the last 168 hours. Cardiac Enzymes: No results for input(s): CKTOTAL, CKMB, CKMBINDEX, TROPONINI in the last 168 hours. BNP (last 3 results) No results for input(s): PROBNP in the last 8760 hours. HbA1C: No results for input(s): HGBA1C in the last 72 hours. CBG: No results for input(s): GLUCAP in the last 168 hours. Lipid Profile: No results for input(s): CHOL, HDL, LDLCALC, TRIG, CHOLHDL, LDLDIRECT in the last 72 hours. Thyroid Function Tests: No results for input(s): TSH, T4TOTAL, FREET4, T3FREE, THYROIDAB in the last 72 hours. Anemia Panel: Recent Labs  06/27/19 1632  RETICCTPCT 3.9*   Urine analysis:    Component Value Date/Time   COLORURINE YELLOW 05/06/2019 2043   APPEARANCEUR CLEAR 05/06/2019 2043   LABSPEC 1.008 05/06/2019 2043   PHURINE 7.0 05/06/2019 2043   GLUCOSEU 50 (A) 05/06/2019 2043   HGBUR NEGATIVE 05/06/2019 2043   HGBUR small 03/10/2010 1445   BILIRUBINUR neg 06/03/2019 1419   KETONESUR NEGATIVE 05/06/2019 2043   PROTEINUR Positive (A) 06/03/2019 1419   PROTEINUR >=300 (A) 05/06/2019 2043   UROBILINOGEN 0.2 06/03/2019 1419   UROBILINOGEN 0.2 05/11/2017 1335   NITRITE neg 06/03/2019 1419   NITRITE NEGATIVE 05/06/2019 2043   LEUKOCYTESUR Negative 06/03/2019 1419   LEUKOCYTESUR NEGATIVE 05/06/2019 2043   Sepsis Labs: @LABRCNTIP (procalcitonin:4,lacticidven:4) ) Recent Results (from the past 240 hour(s))  SARS Coronavirus 2 by RT PCR (hospital order, performed in [place] hospital lab) Nasopharyngeal Nasopharyngeal Swab     Status: None   Collection Time: 06/27/19  7:50 PM   Specimen: Nasopharyngeal Swab  Result Value Ref Range Status   SARS Coronavirus 2 NEGATIVE NEGATIVE Final    Comment: (NOTE) SARS-CoV-2 target nucleic acids are NOT DETECTED. The SARS-CoV-2 RNA is generally detectable in upper and lower respiratory specimens during the acute phase of  infection. The lowest concentration of SARS-CoV-2 viral copies this assay can detect is 250 copies / mL. A negative result does not preclude SARS-CoV-2 infection and should not be used as the sole basis for treatment or other patient management decisions.  A negative result may occur with improper specimen collection / handling, submission of specimen other than nasopharyngeal swab, presence of viral mutation(s) within the areas targeted by this assay, and inadequate number of viral copies (<250 copies / mL). A negative result must be combined with clinical observations, patient history, and epidemiological information. Fact Sheet for Patients:   StrictlyIdeas.no Fact Sheet for Healthcare Providers: BankingDealers.co.za This test is not yet approved or cleared  by the [place] FDA and has been authorized for detection and/or diagnosis of SARS-CoV-2 by FDA under an Emergency Use Authorization (EUA).  This EUA will remain in effect (meaning this test can be used) for the duration of the COVID-19 declaration under Section 564(b)(1) of the Act, 21 U.S.C. section 360bbb-3(b)(1), unless the authorization is terminated or revoked sooner. Performed at [facility], [place] [address] [place], [place] [address]      Radiological Exams on Admission: DG Chest 2 View  Result Date: 06/27/2019 CLINICAL DATA:  History of sickle cell with recent COVID vaccination and pain, initial encounter EXAM: CHEST - 2 VIEW COMPARISON:  06/12/2019 FINDINGS: Cardiac shadow is within normal limits. Lungs are well aerated bilaterally without focal confluent infiltrate. Stable calcified granulomas are again noted. No focal infiltrate or sizable effusion is seen. Scattered sclerotic foci within the bony structures are noted consistent with the given clinical history. IMPRESSION: No acute abnormality noted. Electronically Signed   By: Inez Catalina M.D.    On: 06/27/2019 17:41    EKG: Independently reviewed.  Sinus tachycardia.  Assessment/Plan Principal Problem:   Sickle cell crisis ([place]) Active Problems:   CKD (chronic kidney disease), stage IV ([place])    1. Sickle cell pain crisis for which we will continue patient's long-acting OxyContin and will keep patient on Dilaudid PCA.  IV fluids. 2. Fever with SIRS-like picture.  Could be that patient received vaccination.  Could be from that.  If fever persists I will start patient on antibiotics.  Blood cultures have been already obtained.  UA is pending. 3. Chronic kidney disease stage III creatinine appears to be at baseline. 4. Recent admission showed thrombocytopenia which is resolved at this time.  Closely follow CBC.  Given that patient has persistent pain with some fever will need close monitoring for any deterioration in inpatient status.   DVT prophylaxis: Lovenox. Code Status: Full code. Family Communication: Discussed with patient. Disposition Plan: Home. Consults called: None. Admission status: Inpatient.   [name] MD [facility] Pager [phone].  If 7PM-7AM, please contact night-coverage [email or web address] Password [facility]  06/27/2019, 10:21 PM
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
## 2019-06-27 NOTE — ED Notes (Signed)
Pt sitting up in bed watching t.v. NAD noted. Warm blanket given. Pt denies any other needs. Will continue to monitor.

## 2019-06-27 NOTE — ED Triage Notes (Signed)
Patient c/o pain all over with dizziness. Hx sickle cell. States got first dose Moderna vaccine yesterday.  Patient requesting blood draw at time of IV start.

## 2019-06-28 ENCOUNTER — Other Ambulatory Visit: Payer: Self-pay

## 2019-06-28 LAB — CBC WITH DIFFERENTIAL/PLATELET
Abs Immature Granulocytes: 0.08 10*3/uL — ABNORMAL HIGH (ref 0.00–0.07)
Basophils Absolute: 0 10*3/uL (ref 0.0–0.1)
Basophils Relative: 0 %
Eosinophils Absolute: 0 10*3/uL (ref 0.0–0.5)
Eosinophils Relative: 0 %
HCT: 29.2 % — ABNORMAL LOW (ref 39.0–52.0)
Hemoglobin: 9 g/dL — ABNORMAL LOW (ref 13.0–17.0)
Immature Granulocytes: 1 %
Lymphocytes Relative: 46 %
Lymphs Abs: 5.2 10*3/uL — ABNORMAL HIGH (ref 0.7–4.0)
MCH: 31.1 pg (ref 26.0–34.0)
MCHC: 30.8 g/dL (ref 30.0–36.0)
MCV: 101 fL — ABNORMAL HIGH (ref 80.0–100.0)
Monocytes Absolute: 1.3 10*3/uL — ABNORMAL HIGH (ref 0.1–1.0)
Monocytes Relative: 11 %
Neutro Abs: 4.8 10*3/uL (ref 1.7–7.7)
Neutrophils Relative %: 42 %
Platelets: 483 10*3/uL — ABNORMAL HIGH (ref 150–400)
RBC: 2.89 MIL/uL — ABNORMAL LOW (ref 4.22–5.81)
RDW: 21.2 % — ABNORMAL HIGH (ref 11.5–15.5)
WBC: 11.5 10*3/uL — ABNORMAL HIGH (ref 4.0–10.5)
nRBC: 0 % (ref 0.0–0.2)

## 2019-06-28 LAB — COMPREHENSIVE METABOLIC PANEL
ALT: 33 U/L (ref 0–44)
AST: 66 U/L — ABNORMAL HIGH (ref 15–41)
Albumin: 2.8 g/dL — ABNORMAL LOW (ref 3.5–5.0)
Alkaline Phosphatase: 259 U/L — ABNORMAL HIGH (ref 38–126)
Anion gap: 6 (ref 5–15)
BUN: 17 mg/dL (ref 6–20)
CO2: 18 mmol/L — ABNORMAL LOW (ref 22–32)
Calcium: 8.5 mg/dL — ABNORMAL LOW (ref 8.9–10.3)
Chloride: 107 mmol/L (ref 98–111)
Creatinine, Ser: 2.23 mg/dL — ABNORMAL HIGH (ref 0.61–1.24)
GFR calc Af Amer: 42 mL/min — ABNORMAL LOW (ref >60)
GFR calc non Af Amer: 37 mL/min — ABNORMAL LOW (ref >60)
Glucose, Bld: 85 mg/dL (ref 70–99)
Potassium: 4.5 mmol/L (ref 3.5–5.1)
Sodium: 131 mmol/L — ABNORMAL LOW (ref 135–145)
Total Bilirubin: 0.7 mg/dL (ref 0.3–1.2)
Total Protein: 6.5 g/dL (ref 6.5–8.1)

## 2019-06-28 LAB — URINALYSIS, ROUTINE W REFLEX MICROSCOPIC
Bacteria, UA: NONE SEEN
Bilirubin Urine: NEGATIVE
Glucose, UA: 50 mg/dL — AB
Hgb urine dipstick: NEGATIVE
Ketones, ur: NEGATIVE mg/dL
Leukocytes,Ua: NEGATIVE
Nitrite: NEGATIVE
Protein, ur: >=300 mg/dL — AB
Specific Gravity, Urine: 1.009 (ref 1.005–1.030)
pH: 7 (ref 5.0–8.0)

## 2019-06-28 LAB — PROCALCITONIN: Procalcitonin: 0.85 ng/mL

## 2019-06-28 MED ORDER — HYDROMORPHONE HCL 1 MG/ML IJ SOLN
1.0000 mg | INTRAMUSCULAR | Status: DC | PRN
  Administered 2019-06-28 (×3): 1 mg via INTRAVENOUS
  Filled 2019-06-28 (×3): qty 1

## 2019-06-28 MED ORDER — ACETAMINOPHEN 500 MG PO TABS: 500.0000 mg | ORAL_TABLET | Freq: Four times a day (QID) | ORAL | Status: DC | PRN

## 2019-06-28 MED ORDER — NALOXONE HCL 0.4 MG/ML IJ SOLN: 0.4000 mg | INTRAMUSCULAR | Status: DC | PRN

## 2019-06-28 MED ORDER — FOLIC ACID 1 MG PO TABS
1.0000 mg | ORAL_TABLET | Freq: Every day | ORAL | Status: DC
  Administered 2019-06-29 – 2019-06-30 (×2): 1 mg via ORAL
  Filled 2019-06-28 (×2): qty 1

## 2019-06-28 MED ORDER — ONDANSETRON HCL 4 MG/2ML IJ SOLN
4.0000 mg | Freq: Four times a day (QID) | INTRAMUSCULAR | Status: DC | PRN
  Administered 2019-06-29: 4 mg via INTRAVENOUS
  Filled 2019-06-28: qty 2

## 2019-06-28 MED ORDER — SODIUM CHLORIDE 0.9 % IV SOLN
25.0000 mg | INTRAVENOUS | Status: DC | PRN
  Filled 2019-06-28: qty 0.5

## 2019-06-28 MED ORDER — OXYCODONE HCL ER 15 MG PO T12A
15.0000 mg | EXTENDED_RELEASE_TABLET | Freq: Two times a day (BID) | ORAL | Status: DC
  Administered 2019-06-28 – 2019-06-30 (×4): 15 mg via ORAL
  Filled 2019-06-28 (×4): qty 1

## 2019-06-28 MED ORDER — SODIUM CHLORIDE 0.9% FLUSH: 9.0000 mL | INTRAVENOUS | Status: DC | PRN

## 2019-06-28 MED ORDER — DIPHENHYDRAMINE HCL 25 MG PO CAPS: 25.0000 mg | ORAL_CAPSULE | ORAL | Status: DC | PRN

## 2019-06-28 MED ORDER — GABAPENTIN 300 MG PO CAPS
300.0000 mg | ORAL_CAPSULE | Freq: Three times a day (TID) | ORAL | Status: DC
  Filled 2019-06-28 (×2): qty 1

## 2019-06-28 MED ORDER — HYDROMORPHONE 1 MG/ML IV SOLN
INTRAVENOUS | Status: DC
  Administered 2019-06-28: 5.5 mg via INTRAVENOUS
  Administered 2019-06-28: 3.5 mg via INTRAVENOUS
  Administered 2019-06-29: 4 mg via INTRAVENOUS
  Administered 2019-06-29: 3 mg via INTRAVENOUS
  Administered 2019-06-29: 3.5 mg via INTRAVENOUS
  Administered 2019-06-29: 30 mg via INTRAVENOUS
  Administered 2019-06-29: 5.5 mg via INTRAVENOUS
  Administered 2019-06-29: 3.5 mg via INTRAVENOUS
  Administered 2019-06-30: 30 mg via INTRAVENOUS
  Filled 2019-06-28 (×2): qty 30

## 2019-06-28 NOTE — ED Notes (Signed)
Pt lying in bed awake. Pt asking for more pain meds. NAD noted. Will continue to monitor.

## 2019-06-28 NOTE — ED Notes (Signed)
Pt lying in bed watching t.v. NAD noted. Will continue to monitor.

## 2019-06-28 NOTE — ED Notes (Signed)
Attempted to call report to floor, no answer at this time

## 2019-06-28 NOTE — ED Notes (Signed)
Pt called out for hurting IV line. Line is no longer patent. IV team consulted for hard stick. Will continue to monitor pt.

## 2019-06-28 NOTE — ED Notes (Signed)
Patient moved to hospital bed with assistance

## 2019-06-28 NOTE — ED Notes (Signed)
Urine culture sent down to lab with urinalysis. 

## 2019-06-29 DIAGNOSIS — D5700 Hb-SS disease with crisis, unspecified: Principal | ICD-10-CM

## 2019-06-29 DIAGNOSIS — N184 Chronic kidney disease, stage 4 (severe): Secondary | ICD-10-CM

## 2019-06-29 LAB — RAPID URINE DRUG SCREEN, HOSP PERFORMED
Amphetamines: NOT DETECTED
Barbiturates: NOT DETECTED
Benzodiazepines: NOT DETECTED
Cocaine: NOT DETECTED
Opiates: POSITIVE — AB
Tetrahydrocannabinol: POSITIVE — AB

## 2019-06-29 MED ORDER — SODIUM CHLORIDE 0.45 % IV SOLN: INTRAVENOUS | Status: DC

## 2019-06-29 MED ORDER — ENOXAPARIN SODIUM 30 MG/0.3ML ~~LOC~~ SOLN
30.0000 mg | Freq: Every day | SUBCUTANEOUS | Status: DC
  Administered 2019-06-29: 30 mg via SUBCUTANEOUS
  Filled 2019-06-29: qty 0.3

## 2019-06-29 NOTE — Progress Notes (Signed)
Patient ID: [name] [name]., male   DOB: Aug 20, 1980, 39 y.o.   MRN: [id number] Subjective: [name] [name] is a 39 year old male with a medical history significant for sickle cell disease, chronic kidney disease stage III, chronic pain syndrome, opiate dependence and tolerance, history of anemia of chronic disease, history of polysubstance abuse, and history of tobacco dependence admitted for sickle cell pain crisis.  Patient feels much better today, pain is improved, now at 5/10. His goal is 3/10 or less. No other complaint. He denies any fever, cough, chest pain, SOB, nausea, vomiting or diarrhea.   Objective:  Vital signs in last 24 hours:  Vitals:   06/29/19 0828 06/29/19 1028 06/29/19 1146 06/29/19 1150  BP:    111/81  Pulse:  (!) 117  (!) 106  Resp: 11 16 11 12   Temp:    98.2 F (36.8 C)  TempSrc:    Oral  SpO2: 96%  97% 97%  Weight:      Height:        Intake/Output from previous day:   Intake/Output Summary (Last 24 hours) at 06/29/2019 1321 Last data filed at 06/29/2019 0858 Gross per 24 hour  Intake 1805.65 ml  Output 1425 ml  Net 380.65 ml    Physical Exam: General: Alert, awake, oriented x3, in no acute distress.  HEENT: [place]/AT PEERL, EOMI Neck: Trachea midline,  no masses, no thyromegal,y no JVD, no carotid bruit OROPHARYNX:  Moist, No exudate/ erythema/lesions.  Heart: Regular rate and rhythm, without murmurs, rubs, gallops, PMI non-displaced, no heaves or thrills on palpation.  Lungs: Clear to auscultation, no wheezing or rhonchi noted. No increased vocal fremitus resonant to percussion  Abdomen: Soft, nontender, nondistended, positive bowel sounds, no masses no hepatosplenomegaly noted..  Neuro: No focal neurological deficits noted cranial nerves II through XII grossly intact. DTRs 2+ bilaterally upper and lower extremities. Strength 5 out of 5 in bilateral upper and lower extremities. Musculoskeletal: No warm swelling or erythema around joints, no  spinal tenderness noted. Psychiatric: Patient alert and oriented x3, good insight and cognition, good recent to remote recall. Lymph node survey: No cervical axillary or inguinal lymphadenopathy noted.  Lab Results:  Basic Metabolic Panel:    Component Value Date/Time   NA 131 (L) 06/28/2019 0700   NA 138 06/03/2019 1423   K 4.5 06/28/2019 0700   CL 107 06/28/2019 0700   CO2 18 (L) 06/28/2019 0700   BUN 17 06/28/2019 0700   BUN 31 (H) 06/03/2019 1423   CREATININE 2.23 (H) 06/28/2019 0700   CREATININE 1.45 (H) 05/12/2014 1430   GLUCOSE 85 06/28/2019 0700   CALCIUM 8.5 (L) 06/28/2019 0700   CBC:    Component Value Date/Time   WBC 11.5 (H) 06/28/2019 0700   HGB 9.0 (L) 06/28/2019 0700   HGB 5.5 (LL) 06/03/2019 1423   HCT 29.2 (L) 06/28/2019 0700   HCT 17.4 (LL) 06/03/2019 1423   PLT 483 (H) 06/28/2019 0700   PLT Comment 06/03/2019 1423   MCV 101.0 (H) 06/28/2019 0700   MCV 92 06/03/2019 1423   NEUTROABS 4.8 06/28/2019 0700   NEUTROABS 2.2 06/03/2019 1423   LYMPHSABS 5.2 (H) 06/28/2019 0700   LYMPHSABS 4.1 (H) 06/03/2019 1423   MONOABS 1.3 (H) 06/28/2019 0700   EOSABS 0.0 06/28/2019 0700   EOSABS 0.0 06/03/2019 1423   BASOSABS 0.0 06/28/2019 0700   BASOSABS 0.0 06/03/2019 1423    Recent Results (from the past 240 hour(s))  SARS Coronavirus 2 by RT PCR (hospital order,  performed in [facility] hospital lab) Nasopharyngeal Nasopharyngeal Swab     Status: None   Collection Time: 06/27/19  7:50 PM   Specimen: Nasopharyngeal Swab  Result Value Ref Range Status   SARS Coronavirus 2 NEGATIVE NEGATIVE Final    Comment: (NOTE) SARS-CoV-2 target nucleic acids are NOT DETECTED. The SARS-CoV-2 RNA is generally detectable in upper and lower respiratory specimens during the acute phase of infection. The lowest concentration of SARS-CoV-2 viral copies this assay can detect is 250 copies / mL. A negative result does not preclude SARS-CoV-2 infection and should not be used as the  sole basis for treatment or other patient management decisions.  A negative result may occur with improper specimen collection / handling, submission of specimen other than nasopharyngeal swab, presence of viral mutation(s) within the areas targeted by this assay, and inadequate number of viral copies (<250 copies / mL). A negative result must be combined with clinical observations, patient history, and epidemiological information. Fact Sheet for Patients:   BoilerBrush.com.cy Fact Sheet for Healthcare Providers: https://pope.com/ This test is not yet approved or cleared  by the [place] FDA and has been authorized for detection and/or diagnosis of SARS-CoV-2 by FDA under an Emergency Use Authorization (EUA).  This EUA will remain in effect (meaning this test can be used) for the duration of the COVID-19 declaration under Section 564(b)(1) of the Act, 21 U.S.C. section 360bbb-3(b)(1), unless the authorization is terminated or revoked sooner. Performed at [facility], [address], [place], [place] [address]   Culture, blood (routine x 2)     Status: None (Preliminary result)   Collection Time: 06/27/19  7:50 PM   Specimen: BLOOD  Result Value Ref Range Status   Specimen Description   Final    BLOOD LEFT ANTECUBITAL Performed at [facility], [address] [place], [place] [address]    Special Requests   Final    BOTTLES DRAWN AEROBIC AND ANAEROBIC Blood Culture results may not be optimal due to an excessive volume of blood received in culture bottles Performed at [facility], [address], [place], [place] [address]    Culture   Final    NO GROWTH 2 DAYS Performed at [facility], [address] [place], [place] [address]    Report Status PENDING  Incomplete  Culture, blood (routine x 2)     Status: None (Preliminary result)   Collection Time: 06/27/19   9:09 PM   Specimen: BLOOD  Result Value Ref Range Status   Specimen Description   Final    BLOOD RIGHT HAND Performed at [facility], [address], [place], [place] [address]    Special Requests   Final    BOTTLES DRAWN AEROBIC ONLY Blood Culture adequate volume Performed at [facility], [address] [place], [place] [address]    Culture   Final    NO GROWTH 2 DAYS Performed at [facility], [address], [place], [place] [address]    Report Status PENDING  Incomplete    Studies/Results: DG Chest 2 View  Result Date: 06/27/2019 CLINICAL DATA:  History of sickle cell with recent COVID vaccination and pain, initial encounter EXAM: CHEST - 2 VIEW COMPARISON:  06/12/2019 FINDINGS: Cardiac shadow is within normal limits. Lungs are well aerated bilaterally without focal confluent infiltrate. Stable calcified granulomas are again noted. No focal infiltrate or sizable effusion is seen. Scattered sclerotic foci within the  bony structures are noted consistent with the given clinical history. IMPRESSION: No acute abnormality noted. Electronically Signed   By: Alcide Clever M.D.   On: 06/27/2019 17:41    Medications: Scheduled Meds: . enoxaparin (LOVENOX) injection  30 mg Subcutaneous QHS  . folic acid  1 mg Oral Daily  . gabapentin  300 mg Oral TID  . HYDROmorphone   Intravenous Q4H  . oxyCODONE  15 mg Oral Q12H  . senna-docusate  1 tablet Oral BID   Continuous Infusions: . sodium chloride    . diphenhydrAMINE     PRN Meds:.acetaminophen, diphenhydrAMINE **OR** diphenhydrAMINE, naloxone **AND** sodium chloride flush, ondansetron (ZOFRAN) IV, polyethylene glycol  Consultants:  None  Procedures:  None  Antibiotics:  None  Assessment/Plan: Principal Problem:   Sickle cell crisis (HCC) Active Problems:   CKD (chronic kidney disease), stage IV (HCC)  1. Hb Sickle Cell Disease with crisis: Reduce IVF .45% Saline to 75  mls/hour, continue weight based Dilaudid PCA, restart and continue oral home pain medications, IV Toradol is contraindicated due to CKD IV, Monitor vitals very closely, Re-evaluate pain scale regularly, 2 L of Oxygen by [place]. 2. Sickle Cell Anemia: Hb is stable at baseline, no clinical indication for blood transfusion today. 3. Chronic pain Syndrome: Restart and continue home pain medications. Patient counseled extensively about other nonpharmacologic means of pain management. 4. CKD Stage IV: Serum creatinine at baseline, avoid all nephrotoxins, patient is followed by Nephrologist. 5. Tobacco Use Disorder: [place] was counseled on the dangers of tobacco use, and was advised to quit. Reviewed strategies to maximize success, including removing cigarettes and smoking materials from environment, stress management and support of family/friends. 6. Fever: Resolved. Most likely reaction to recent COVID-19 vaccination. All cultures are negative so far. Will continue to monitor. No clinical indication for antibiotics at the moment.  Code Status: Full Code Family Communication: N/A Disposition Plan: Not yet ready for discharge  Riannah Stagner  If 7PM-7AM, please contact night-coverage.  06/29/2019, 1:21 PM  LOS: 2 days

## 2019-06-29 NOTE — Progress Notes (Signed)
Patient removed his tele leads and box. He stated he does not want to it anymore. MD, and charge nurse notified.  Patient refused neurontin, docusate and lovenox as well. Thyra Breed RN MSN

## 2019-06-30 LAB — CBC WITH DIFFERENTIAL/PLATELET
Abs Immature Granulocytes: 0.05 10*3/uL (ref 0.00–0.07)
Basophils Absolute: 0.1 10*3/uL (ref 0.0–0.1)
Basophils Relative: 0 %
Eosinophils Absolute: 0 10*3/uL (ref 0.0–0.5)
Eosinophils Relative: 0 %
HCT: 31.6 % — ABNORMAL LOW (ref 39.0–52.0)
Hemoglobin: 9.9 g/dL — ABNORMAL LOW (ref 13.0–17.0)
Immature Granulocytes: 0 %
Lymphocytes Relative: 47 %
Lymphs Abs: 6.5 10*3/uL — ABNORMAL HIGH (ref 0.7–4.0)
MCH: 30.7 pg (ref 26.0–34.0)
MCHC: 31.3 g/dL (ref 30.0–36.0)
MCV: 98.1 fL (ref 80.0–100.0)
Monocytes Absolute: 2.1 10*3/uL — ABNORMAL HIGH (ref 0.1–1.0)
Monocytes Relative: 15 %
Neutro Abs: 5.3 10*3/uL (ref 1.7–7.7)
Neutrophils Relative %: 38 %
Platelets: 440 10*3/uL — ABNORMAL HIGH (ref 150–400)
RBC: 3.22 MIL/uL — ABNORMAL LOW (ref 4.22–5.81)
RDW: 20.2 % — ABNORMAL HIGH (ref 11.5–15.5)
WBC: 14 10*3/uL — ABNORMAL HIGH (ref 4.0–10.5)
nRBC: 0.1 % (ref 0.0–0.2)

## 2019-06-30 LAB — COMPREHENSIVE METABOLIC PANEL
ALT: 36 U/L (ref 0–44)
AST: 50 U/L — ABNORMAL HIGH (ref 15–41)
Albumin: 3.1 g/dL — ABNORMAL LOW (ref 3.5–5.0)
Alkaline Phosphatase: 308 U/L — ABNORMAL HIGH (ref 38–126)
Anion gap: 10 (ref 5–15)
BUN: 19 mg/dL (ref 6–20)
CO2: 18 mmol/L — ABNORMAL LOW (ref 22–32)
Calcium: 8.7 mg/dL — ABNORMAL LOW (ref 8.9–10.3)
Chloride: 105 mmol/L (ref 98–111)
Creatinine, Ser: 2.34 mg/dL — ABNORMAL HIGH (ref 0.61–1.24)
GFR calc Af Amer: 40 mL/min — ABNORMAL LOW (ref >60)
GFR calc non Af Amer: 34 mL/min — ABNORMAL LOW (ref >60)
Glucose, Bld: 101 mg/dL — ABNORMAL HIGH (ref 70–99)
Potassium: 4.5 mmol/L (ref 3.5–5.1)
Sodium: 133 mmol/L — ABNORMAL LOW (ref 135–145)
Total Bilirubin: 1.2 mg/dL (ref 0.3–1.2)
Total Protein: 6.8 g/dL (ref 6.5–8.1)

## 2019-06-30 MED ORDER — OXYCODONE HCL 10 MG PO TABS: 10.0000 mg | ORAL_TABLET | Freq: Four times a day (QID) | ORAL | 0 refills | Status: DC | PRN

## 2019-06-30 MED ORDER — HYDROMORPHONE HCL 1 MG/ML IJ SOLN
1.0000 mg | INTRAMUSCULAR | Status: DC | PRN
  Administered 2019-06-30: 1 mg via INTRAVENOUS
  Filled 2019-06-30: qty 1

## 2019-06-30 NOTE — Progress Notes (Signed)
Patient was verbally abusive and belligerant to Probation officer, another RN and the [facility] when he was asked to keep his SpO2 probe on. He has been non compliant with the PCA through out the whole shift even after writer had explainned several times the importance of complying. Patient was found to turned the endtidal channel off, and the probe tied around his bed. Probe was reapplied and patient educated.   At 0535, the [facility] went in to reassess the situation and found patient still not compliant with the PCA. Patient was verbally aggressive to both the [facility], Probation officer, and another Therapist, sports. PCA was discontinued per AC's order. MD on call notified, and a request for another route to administer pain meds made.  Discontinued IV HYROmorphone (Dilaudid) 1mg  / ml (79ml) remaining wasted by Probation officer, and witnessed by Jeanella Craze RN.

## 2019-06-30 NOTE — Discharge Summary (Signed)
Physician Discharge Summary  [name] [name]. ALP:[id number] DOB: 10-25-1980 DOA: 06/27/2019  PCP: [name], MD  Admit date: 06/27/2019  Discharge date: 06/30/2019  Discharge Diagnoses:  Principal Problem:   Sickle cell crisis ([place]) Active Problems:   CKD (chronic kidney disease), stage IV ([place])   Discharge Condition: Stable  Disposition:  Pt is discharged home in good condition and is to follow up with [name], MD in 2 weeks to have labs evaluated. Celvin A Tetro Jr. is instructed to increase activity slowly and balance with rest for the next few days, and use prescribed medication to complete treatment of pain  Diet: Regular Wt Readings from Last 3 Encounters:  06/28/19 49 kg  06/13/19 55.3 kg  06/03/19 55.3 kg    History of present illness:  [name] [name] is a 39 year old male with a medical history significant for sickle cell disease, chronic kidney disease stage III, chronic pain syndrome, opiate dependence and tolerance, history of anemia of chronic disease, history of polysubstance abuse, and history of tobacco dependence presented to the emergency department with complaints of generalized body pain over the past 24 hours.  Patient denied any productive cough, chest pain, nausea, vomiting, or diarrhea.  Denies headache or visual symptoms.  Patient attributes crisis to receiving second dose of COVID-19 vaccination around 48 hours ago.  ER course: In the ER, patient was febrile with a temperature of 100.4, heart rate 118/min, and patient had blood cultures drawn.  Chest x-ray shows no acute cardiopulmonary process.  UA pending.  No definite signs of infection.  Patient was not hypoxic.  COVID-19 test negative.  Lab work shows WBCs at 10.7, creatinine 2.2, LFTs were normal.  Lactic acid was normal.  Patient admitted for sickle cell pain crisis.  Hospital Course:  Sickle cell disease with pain crisis: Patient was admitted to [facility] for  sickle cell pain crisis and managed appropriately with IV fluids, IV Dilaudid PCA, and other adjunct therapies per sickle cell pain management protocol. IV Toradol was held due to history of chronic kidney disease stage III. Patient has a history of chronic pain and OxyContin was continued throughout admission without interruption. Pain intensity decreased and patient was weaned and transitioned to oral home medications.  Patient's pain intensity is 5/10, he says that he can manage at home on current opiate medication regimen and is requesting discharge. Patient advised to follow-up with sickle cell day infusion clinic in 1 week for CBC with differential.  Patient is to receive darbepoetin injections every other week if hemoglobin is less than 8.0 g/dL.  Patient is followed by [facility] hematology and will follow-up as scheduled.  He is alert, oriented, and ambulating without assistance.  Patient will discharge home in a hemodynamically stable condition.    Discharge Exam: Vitals:   06/30/19 0315 06/30/19 0721  BP: 125/87 (!) 126/99  Pulse: (!) 110 (!) 106  Resp: 14 16  Temp: 98.5 F (36.9 C)   SpO2: 100%    Vitals:   06/29/19 2347 06/30/19 0303 06/30/19 0315 06/30/19 0721  BP:   125/87 (!) 126/99  Pulse:   (!) 110 (!) 106  Resp: 16 14 14 16   Temp:   98.5 F (36.9 C)   TempSrc:   Oral   SpO2: 97% 99% 100%   Weight:      Height:        General appearance : Awake, alert, not in any distress. Speech Clear. Not toxic looking HEENT: Atraumatic and Normocephalic, pupils  equally reactive to light and accomodation Neck: Supple, no JVD. No cervical lymphadenopathy.  Chest: Good air entry bilaterally, no added sounds  CVS: S1 S2 regular, no murmurs.  Abdomen: Bowel sounds present, Non tender and not distended with no gaurding, rigidity or rebound. Extremities: B/L Lower Ext shows no edema, both legs are warm to touch Neurology: Awake alert, and oriented X 3, CN II-XII intact, Non  focal Skin: No Rash  Discharge Instructions  Discharge Instructions    Discharge patient   Complete by: As directed    Discharge disposition: 01-Home or Self Care   Discharge patient date: 06/30/2019     Allergies as of 06/30/2019      Reactions   Nsaids Other (See Comments)   Kidney disease   Morphine And Related Other (See Comments)   "Real bad headaches"       Medication List    TAKE these medications   acetaminophen 500 MG tablet Commonly known as: TYLENOL Take 1 tablet (500 mg total) by mouth every 6 (six) hours as needed.   folic acid 1 MG tablet Commonly known as: FOLVITE Take 1 tablet (1 mg total) by mouth daily.   gabapentin 300 MG capsule Commonly known as: NEURONTIN Take 1 capsule (300 mg total) by mouth 2 (two) times daily as needed.   nortriptyline 25 MG capsule Commonly known as: PAMELOR Take 1 capsule (25 mg total) by mouth 2 (two) times daily.   Oxycodone HCl 10 MG Tabs Take 1 tablet (10 mg total) by mouth every 6 (six) hours as needed. What changed: reasons to take this   oxyCODONE 15 mg 12 hr tablet Commonly known as: OxyCONTIN Take 1 tablet (15 mg total) by mouth every 12 (twelve) hours. What changed: Another medication with the same name was changed. Make sure you understand how and when to take each.   sodium bicarbonate 650 MG tablet Take 650 mg by mouth 2 (two) times daily.   Vitamin D3 50 MCG (2000 UT) Tabs Take 2 tablets by mouth daily.       The results of significant diagnostics from this hospitalization (including imaging, microbiology, ancillary and laboratory) are listed below for reference.    Significant Diagnostic Studies: DG Chest 2 View  Result Date: 06/27/2019 CLINICAL DATA:  History of sickle cell with recent COVID vaccination and pain, initial encounter EXAM: CHEST - 2 VIEW COMPARISON:  06/12/2019 FINDINGS: Cardiac shadow is within normal limits. Lungs are well aerated bilaterally without focal confluent infiltrate.  Stable calcified granulomas are again noted. No focal infiltrate or sizable effusion is seen. Scattered sclerotic foci within the bony structures are noted consistent with the given clinical history. IMPRESSION: No acute abnormality noted. Electronically Signed   By: Inez Catalina M.D.   On: 06/27/2019 17:41   DG Chest 2 View  Result Date: 06/12/2019 CLINICAL DATA:  Swelling and discomfort EXAM: CHEST - 2 VIEW COMPARISON:  05/06/2019 FINDINGS: No acute consolidation or effusion. Small calcified lung nodules. Normal cardiomediastinal silhouette. No pneumothorax. Avascular necrosis of right humeral head as before. IMPRESSION: No active cardiopulmonary disease. Electronically Signed   By: Donavan Foil M.D.   On: 06/12/2019 19:17   DG Knee Complete 4 Views Left  Result Date: 06/12/2019 CLINICAL DATA:  Swelling and discomfort EXAM: LEFT KNEE - COMPLETE 4+ VIEW COMPARISON:  None. FINDINGS: No acute displaced fracture or malalignment. Medial joint space narrowing. Small moderate knee effusion. Subarticular sclerosis and lucency within the lateral femoral condyle. IMPRESSION: 1. Positive for knee effusion.  2. No acute fracture 3. Subarticular sclerosis and lucency at the lateral femoral condyle, possibly representing an area of osteonecrosis. Electronically Signed   By: Donavan Foil M.D.   On: 06/12/2019 19:15    Microbiology: Recent Results (from the past 240 hour(s))  SARS Coronavirus 2 by RT PCR (hospital order, performed in [facility] hospital lab) Nasopharyngeal Nasopharyngeal Swab     Status: None   Collection Time: 06/27/19  7:50 PM   Specimen: Nasopharyngeal Swab  Result Value Ref Range Status   SARS Coronavirus 2 NEGATIVE NEGATIVE Final    Comment: (NOTE) SARS-CoV-2 target nucleic acids are NOT DETECTED. The SARS-CoV-2 RNA is generally detectable in upper and lower respiratory specimens during the acute phase of infection. The lowest concentration of SARS-CoV-2 viral copies this assay can  detect is 250 copies / mL. A negative result does not preclude SARS-CoV-2 infection and should not be used as the sole basis for treatment or other patient management decisions.  A negative result may occur with improper specimen collection / handling, submission of specimen other than nasopharyngeal swab, presence of viral mutation(s) within the areas targeted by this assay, and inadequate number of viral copies (<250 copies / mL). A negative result must be combined with clinical observations, patient history, and epidemiological information. Fact Sheet for Patients:   StrictlyIdeas.no Fact Sheet for Healthcare Providers: BankingDealers.co.za This test is not yet approved or cleared  by the [place] FDA and has been authorized for detection and/or diagnosis of SARS-CoV-2 by FDA under an Emergency Use Authorization (EUA).  This EUA will remain in effect (meaning this test can be used) for the duration of the COVID-19 declaration under Section 564(b)(1) of the Act, 21 U.S.C. section 360bbb-3(b)(1), unless the authorization is terminated or revoked sooner. Performed at [facility], [place] [address], [place], [place] [address]   Culture, blood (routine x 2)     Status: None (Preliminary result)   Collection Time: 06/27/19  7:50 PM   Specimen: BLOOD  Result Value Ref Range Status   Specimen Description   Final    BLOOD LEFT ANTECUBITAL Performed at [facility] [address] [place], [place] [address]    Special Requests   Final    BOTTLES DRAWN AEROBIC AND ANAEROBIC Blood Culture results may not be optimal due to an excessive volume of blood received in culture bottles Performed at [facility] [address] [place], [place] [address]    Culture   Final    NO GROWTH 3 DAYS Performed at [facility], [place] [address], [place], [place] [address]    Report Status  PENDING  Incomplete  Culture, blood (routine x 2)     Status: None (Preliminary result)   Collection Time: 06/27/19  9:09 PM   Specimen: BLOOD  Result Value Ref Range Status   Specimen Description   Final    BLOOD RIGHT HAND Performed at [facility] [address] [place], [place] [address]    Special Requests   Final    BOTTLES DRAWN AEROBIC ONLY Blood Culture adequate volume Performed at [facility] [address] [place], [place] [address]    Culture   Final    NO GROWTH 3 DAYS Performed at [facility], [place] [address], [place],  [address]    Report Status PENDING  Incomplete     Labs: Basic Metabolic Panel: Recent Labs  Lab 06/27/19 1632 06/27/19 1632  06/28/19 0700 06/30/19 0324  NA 134*  --  131* 133*  K 4.4   < > 4.5 4.5  CL 107  --  107 105  CO2 19*  --  18* 18*  GLUCOSE 100*  --  85 101*  BUN 18  --  17 19  CREATININE 2.28*  --  2.23* 2.34*  CALCIUM 8.7*  --  8.5* 8.7*   < > = values in this interval not displayed.   Liver Function Tests: Recent Labs  Lab 06/27/19 1632 06/28/19 0700 06/30/19 0324  AST 38 66* 50*  ALT 25 33 36  ALKPHOS 289* 259* 308*  BILITOT 0.5 0.7 1.2  PROT 7.1 6.5 6.8  ALBUMIN 3.2* 2.8* 3.1*   No results for input(s): LIPASE, AMYLASE in the last 168 hours. No results for input(s): AMMONIA in the last 168 hours. CBC: Recent Labs  Lab 06/27/19 1632 06/28/19 0700 06/30/19 0324  WBC 10.7* 11.5* 14.0*  NEUTROABS 7.1 4.8 5.3  HGB 10.3* 9.0* 9.9*  HCT 32.5* 29.2* 31.6*  MCV 99.1 101.0* 98.1  PLT 465* 483* 440*   Cardiac Enzymes: No results for input(s): CKTOTAL, CKMB, CKMBINDEX, TROPONINI in the last 168 hours. BNP: Invalid input(s): POCBNP CBG: No results for input(s): GLUCAP in the last 168 hours.  Time coordinating discharge: 40 minutes  Signed:  [name]  APRN, MSN, FNP-C Patient [facility] [address] [place], [place] [address] [phone] [facility] 06/30/2019, 12:23 PM

## 2019-06-30 NOTE — Progress Notes (Signed)
Pt left unit immediately after provider left room and informed him of discharge.  RN had removed PIV prior, catheter was intact and clotting occurred within expected timeframe.

## 2019-06-30 NOTE — Care Management Important Message (Signed)
Important Message  Patient Details IM Letter given to Marney Doctor RN Case Manager to present to the Patient Name: [name] [name]. MRN: [id number] Date of Birth: 1980/05/07   Medicare Important Message Given:  Yes     Kerin Salen 06/30/2019, 10:25 AM

## 2019-06-30 NOTE — Discharge Instructions (Signed)
Sickle Cell Anemia, Adult  Sickle cell anemia is a condition where your red blood cells are shaped like sickles. Red blood cells carry oxygen through the body. Sickle-shaped cells do not live as long as normal red blood cells. They also clump together and block blood from flowing through the blood vessels. This prevents the body from getting enough oxygen. Sickle cell anemia causes organ damage and pain. It also increases the risk of infection. Follow these instructions at home: Medicines  Take over-the-counter and prescription medicines only as told by your doctor.  If you were prescribed an antibiotic medicine, take it as told by your doctor. Do not stop taking the antibiotic even if you start to feel better.  If you develop a fever, do not take medicines to lower the fever right away. Tell your doctor about the fever. Managing pain, stiffness, and swelling  Try these methods to help with pain: ? Use a heating pad. ? Take a warm bath. ? Distract yourself, such as by watching TV. Eating and drinking  Drink enough fluid to keep your pee (urine) clear or pale yellow. Drink more in hot weather and during exercise.  Limit or avoid alcohol.  Eat a healthy diet. Eat plenty of fruits, vegetables, whole grains, and lean protein.  Take vitamins and supplements as told by your doctor. Traveling  When traveling, keep these with you: ? Your medical information. ? The names of your doctors. ? Your medicines.  If you need to take an airplane, talk to your doctor first. Activity  Rest often.  Avoid exercises that make your heart beat much faster, such as jogging. General instructions  Do not use products that have nicotine or tobacco, such as cigarettes and e-cigarettes. If you need help quitting, ask your doctor.  Consider wearing a medical alert bracelet.  Avoid being in high places (high altitudes), such as mountains.  Avoid very hot or cold temperatures.  Avoid places where the  temperature changes a lot.  Keep all follow-up visits as told by your doctor. This is important. Contact a doctor if:  A joint hurts.  Your feet or hands hurt or swell.  You feel tired (fatigued). Get help right away if:  You have symptoms of infection. These include: ? Fever. ? Chills. ? Being very tired. ? Irritability. ? Poor eating. ? Throwing up (vomiting).  You feel dizzy or faint.  You have new stomach pain, especially on the left side.  You have a an erection (priapism) that lasts more than 4 hours.  You have numbness in your arms or legs.  You have a hard time moving your arms or legs.  You have trouble talking.  You have pain that does not go away when you take medicine.  You are short of breath.  You are breathing fast.  You have a long-term cough.  You have pain in your chest.  You have a bad headache.  You have a stiff neck.  Your stomach looks bloated even though you did not eat much.  Your skin is pale.  You suddenly cannot see well. Summary  Sickle cell anemia is a condition where your red blood cells are shaped like sickles.  Follow your doctor's advice on ways to manage pain, food to eat, activities to do, and steps to take for safe travel.  Get medical help right away if you have any signs of infection, such as a fever. This information is not intended to replace advice given to you by   your health care provider. Make sure you discuss any questions you have with your health care provider. Document Revised: 05/24/2018 Document Reviewed: 03/07/2016 Elsevier Patient Education  2020 [employer]  

## 2019-06-30 NOTE — Progress Notes (Signed)
HYDROmorphone ( Dilaudid) 1 mg/ml PCA injection completed, wasted 5 ml of remaining med into steri cycle per pharmacist, Amy. Same done and witnessed by Jeanella Craze RN.

## 2019-06-30 NOTE — Progress Notes (Signed)
New order for Dilaudid 1mg  Inj every 3 hours PRN ordered. Patient notified of new order.

## 2019-06-30 NOTE — Plan of Care (Signed)
Pt VS WNL.  States that he is "ready to go home" at this time.  Problem: Education: Goal: Knowledge of General Education information will improve Description: Including pain rating scale, medication(s)/side effects and non-pharmacologic comfort measures Outcome: Progressing   Problem: Health Behavior/Discharge Planning: Goal: Ability to manage health-related needs will improve Outcome: Progressing   Problem: Clinical Measurements: Goal: Ability to maintain clinical measurements within normal limits will improve Outcome: Progressing Goal: Will remain free from infection Outcome: Progressing Goal: Diagnostic test results will improve Outcome: Progressing Goal: Respiratory complications will improve Outcome: Progressing Goal: Cardiovascular complication will be avoided Outcome: Progressing   Problem: Activity: Goal: Risk for activity intolerance will decrease Outcome: Progressing   Problem: Nutrition: Goal: Adequate nutrition will be maintained Outcome: Progressing   Problem: Coping: Goal: Level of anxiety will decrease Outcome: Progressing   Problem: Elimination: Goal: Will not experience complications related to bowel motility Outcome: Progressing Goal: Will not experience complications related to urinary retention Outcome: Progressing   Problem: Pain Managment: Goal: General experience of comfort will improve Outcome: Progressing   Problem: Safety: Goal: Ability to remain free from injury will improve Outcome: Progressing   Problem: Skin Integrity: Goal: Risk for impaired skin integrity will decrease Outcome: Progressing

## 2019-07-02 LAB — CULTURE, BLOOD (ROUTINE X 2)
Culture: NO GROWTH
Culture: NO GROWTH
Special Requests: ADEQUATE

## 2019-07-07 ENCOUNTER — Telehealth: Payer: Self-pay | Admitting: Internal Medicine

## 2019-07-07 ENCOUNTER — Other Ambulatory Visit: Payer: Self-pay | Admitting: Family Medicine

## 2019-07-07 DIAGNOSIS — G894 Chronic pain syndrome: Secondary | ICD-10-CM

## 2019-07-07 DIAGNOSIS — D571 Sickle-cell disease without crisis: Secondary | ICD-10-CM

## 2019-07-07 MED ORDER — OXYCODONE HCL ER 15 MG PO T12A: 15.0000 mg | EXTENDED_RELEASE_TABLET | Freq: Two times a day (BID) | ORAL | 0 refills | Status: DC

## 2019-07-07 NOTE — Progress Notes (Signed)
Meds ordered this encounter  Medications  . oxyCODONE (OXYCONTIN) 15 mg 12 hr tablet    Sig: Take 1 tablet (15 mg total) by mouth every 12 (twelve) hours.    Dispense:  60 tablet    Refill:  0    Order Specific Question:   Supervising Provider    Answer:   [name] [[id number]]    Reviewed PDMP substance reporting system prior to prescribing opiate medications. No inconsistencies noted.   [name]  APRN, MSN, FNP-C Patient [name] [address] [place], [place] [address] [phone]

## 2019-07-07 NOTE — Telephone Encounter (Signed)
Pt wants a refill on oxycotin 15mg . Pt states it should be due today or tomorrow

## 2019-08-01 ENCOUNTER — Ambulatory Visit (HOSPITAL_COMMUNITY)
Admission: RE | Admit: 2019-08-01 | Discharge: 2019-08-01 | Disposition: A | Discharge status: Home / Self Care | Payer: Medicare Other | Source: Ambulatory Visit | Attending: Internal Medicine | Admitting: Internal Medicine

## 2019-08-01 ENCOUNTER — Inpatient Hospital Stay (HOSPITAL_COMMUNITY)
Admission: EM | Admit: 2019-08-01 | Discharge: 2019-08-04 | DRG: 812 | Disposition: A | Discharge status: Home / Self Care | Payer: Medicare Other | Attending: Internal Medicine | Admitting: Internal Medicine

## 2019-08-01 ENCOUNTER — Other Ambulatory Visit: Payer: Self-pay | Admitting: Family Medicine

## 2019-08-01 ENCOUNTER — Other Ambulatory Visit: Payer: Self-pay

## 2019-08-01 ENCOUNTER — Encounter (HOSPITAL_COMMUNITY): Payer: Self-pay | Admitting: *Deleted

## 2019-08-01 DIAGNOSIS — Z803 Family history of malignant neoplasm of breast: Secondary | ICD-10-CM

## 2019-08-01 DIAGNOSIS — K219 Gastro-esophageal reflux disease without esophagitis: Secondary | ICD-10-CM | POA: Diagnosis present

## 2019-08-01 DIAGNOSIS — D631 Anemia in chronic kidney disease: Secondary | ICD-10-CM | POA: Diagnosis present

## 2019-08-01 DIAGNOSIS — G894 Chronic pain syndrome: Secondary | ICD-10-CM | POA: Diagnosis present

## 2019-08-01 DIAGNOSIS — Z20822 Contact with and (suspected) exposure to covid-19: Secondary | ICD-10-CM | POA: Diagnosis present

## 2019-08-01 DIAGNOSIS — F17210 Nicotine dependence, cigarettes, uncomplicated: Secondary | ICD-10-CM | POA: Diagnosis present

## 2019-08-01 DIAGNOSIS — D5700 Hb-SS disease with crisis, unspecified: Secondary | ICD-10-CM

## 2019-08-01 DIAGNOSIS — R42 Dizziness and giddiness: Secondary | ICD-10-CM | POA: Diagnosis not present

## 2019-08-01 DIAGNOSIS — L0201 Cutaneous abscess of face: Secondary | ICD-10-CM

## 2019-08-01 DIAGNOSIS — D72829 Elevated white blood cell count, unspecified: Secondary | ICD-10-CM | POA: Diagnosis present

## 2019-08-01 DIAGNOSIS — E875 Hyperkalemia: Secondary | ICD-10-CM | POA: Diagnosis not present

## 2019-08-01 DIAGNOSIS — N179 Acute kidney failure, unspecified: Secondary | ICD-10-CM | POA: Diagnosis not present

## 2019-08-01 DIAGNOSIS — F17200 Nicotine dependence, unspecified, uncomplicated: Secondary | ICD-10-CM | POA: Diagnosis present

## 2019-08-01 DIAGNOSIS — N184 Chronic kidney disease, stage 4 (severe): Secondary | ICD-10-CM | POA: Diagnosis present

## 2019-08-01 DIAGNOSIS — E871 Hypo-osmolality and hyponatremia: Secondary | ICD-10-CM | POA: Diagnosis present

## 2019-08-01 LAB — CBC WITH DIFFERENTIAL/PLATELET
Abs Immature Granulocytes: 0.11 10*3/uL — ABNORMAL HIGH (ref 0.00–0.07)
Basophils Absolute: 0.1 10*3/uL (ref 0.0–0.1)
Basophils Relative: 0 %
Eosinophils Absolute: 0.5 10*3/uL (ref 0.0–0.5)
Eosinophils Relative: 3 %
HCT: 26.5 % — ABNORMAL LOW (ref 39.0–52.0)
Hemoglobin: 8.8 g/dL — ABNORMAL LOW (ref 13.0–17.0)
Immature Granulocytes: 1 %
Lymphocytes Relative: 26 %
Lymphs Abs: 5.6 10*3/uL — ABNORMAL HIGH (ref 0.7–4.0)
MCH: 31.2 pg (ref 26.0–34.0)
MCHC: 33.2 g/dL (ref 30.0–36.0)
MCV: 94 fL (ref 80.0–100.0)
Monocytes Absolute: 1.7 10*3/uL — ABNORMAL HIGH (ref 0.1–1.0)
Monocytes Relative: 8 %
Neutro Abs: 13.2 10*3/uL — ABNORMAL HIGH (ref 1.7–7.7)
Neutrophils Relative %: 62 %
Platelets: 285 10*3/uL (ref 150–400)
RBC: 2.82 MIL/uL — ABNORMAL LOW (ref 4.22–5.81)
RDW: 22.6 % — ABNORMAL HIGH (ref 11.5–15.5)
WBC: 21.1 10*3/uL — ABNORMAL HIGH (ref 4.0–10.5)
nRBC: 0.2 % (ref 0.0–0.2)

## 2019-08-01 LAB — TYPE AND SCREEN
ABO/RH(D): O POS
Antibody Screen: NEGATIVE

## 2019-08-01 MED ORDER — DIPHENHYDRAMINE HCL 25 MG PO CAPS
25.0000 mg | ORAL_CAPSULE | Freq: Once | ORAL | Status: AC
  Administered 2019-08-02: 25 mg via ORAL
  Filled 2019-08-01: qty 1

## 2019-08-01 MED ORDER — MECLIZINE HCL 25 MG PO TABS
50.0000 mg | ORAL_TABLET | Freq: Once | ORAL | Status: AC
  Administered 2019-08-02: 50 mg via ORAL
  Filled 2019-08-01: qty 2

## 2019-08-01 MED ORDER — HYDROMORPHONE HCL 2 MG/ML IJ SOLN
2.0000 mg | Freq: Once | INTRAMUSCULAR | Status: AC
  Administered 2019-08-02: 2 mg via SUBCUTANEOUS
  Filled 2019-08-01: qty 1

## 2019-08-01 MED ORDER — SODIUM CHLORIDE 0.9% FLUSH
3.0000 mL | Freq: Once | INTRAVENOUS | Status: AC
  Administered 2019-08-02: 3 mL via INTRAVENOUS

## 2019-08-01 MED ORDER — SODIUM CHLORIDE 0.9 % IV BOLUS
1000.0000 mL | Freq: Once | INTRAVENOUS | Status: AC
  Administered 2019-08-02: 1000 mL via INTRAVENOUS

## 2019-08-01 NOTE — ED Provider Notes (Addendum)
[facility] Emergency Department Provider Note MRN:  [id number]  Arrival date & time: 08/02/19     Chief Complaint   Sickle Cell Pain Crisis   History of Present Illness   [name] [name]. is a 39 y.o. year-old male with a history of sickle cell disease presenting to the ED with chief complaint of sickle cell pain.  Patient explains that he was feeling dizzy this morning and was also experiencing pain to his arms, legs, lower back consistent with his sickle cell pain crises.  Went to infusion center, was sent home but without any other supportive medications.  Here with continued pain and dizziness.  Dizziness is described as a lightheadedness but also as a room spinning sensation that is worse with certain head positions.  Denies any numbness or weakness to the arms or legs.  Pain is moderate to severe, constant, worse with motion or palpation.  Denies trauma, no fever, no cough, no dysuria, no chest pain, no shortness of breath, no abdominal pain.  Review of Systems  A complete 10 system review of systems was obtained and all systems are negative except as noted in the HPI and PMH.   Patient's Health History    Past Medical History:  Diagnosis Date  . Abscess of right iliac fossa 09/24/2018   required Perc Drain 09/24/2018  . Arachnoid Cyst of brain bilaterally    "2 really small ones in the back of my head; inside; saw them w/MRI" (09/25/2012)  . Bacterial pneumonia ~ 2012   "caught it here in the hospital" (09/25/2012)  . Chronic kidney disease    "from my sickle cell" (09/25/2012)  . CKD (chronic kidney disease), stage II   . Colitis 04/19/2017   CT scan abd/ pelvis  . GERD (gastroesophageal reflux disease)    "after I eat alot of spicey foods" (09/25/2012)  . Gynecomastia, male 07/10/2012  . History of blood transfusion    "always related to sickle cell crisis" (09/25/2012)  . 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   Noted in noted from  Hematology notes at [facility]  . Migraines    "take RX qd to prevent them" (09/25/2012)  . Opioid dependence with withdrawal ([place]) 08/30/2012  . Renal insufficiency   . Sickle cell anemia ([facility])   . Sickle cell crisis ([place]) 09/25/2012  . Sickle cell nephropathy ([place]) 07/10/2012  . Sinus tachycardia   . Tachycardia with heart rate 121-140 beats per minute with ambulation 08/04/2016    Past Surgical History:  Procedure Laterality Date  . ABCESS DRAINAGE    . CHOLECYSTECTOMY  ~ 2012  . COLONOSCOPY N/A 04/23/2017   Procedure: COLONOSCOPY;  Surgeon: Irene Shipper, MD;  Location: [facility];  Service: Endoscopy;  Laterality: N/A;  . IR FLUORO GUIDE CV LINE RIGHT  12/17/2016  . IR RADIOLOGIST EVAL & MGMT  10/02/2018  . IR RADIOLOGIST EVAL & MGMT  10/15/2018  . IR REMOVAL TUN CV CATH W/O FL  12/21/2016  . IR US GUIDE VASC ACCESS RIGHT  12/17/2016  . spleenectomy      Family History  Problem Relation Age of Onset  . Breast cancer Mother     Social History   Socioeconomic History  . Marital status: Single    Spouse name: Not on file  . Number of children: Not on file  . Years of education: Not on file  . Highest education level: Not on file  Occupational History  . Not on file  Tobacco Use  .  Smoking status: Current Some Day Smoker    Packs/day: 0.10    Types: Cigarettes  . Smokeless tobacco: Never Used  . Tobacco comment: 09/25/2012 "I don't buy cigarettes; bum one from friends q now and then"  Vaping Use  . Vaping Use: Never used  Substance and Sexual Activity  . Alcohol use: Yes    Alcohol/week: 0.0 standard drinks    Comment: Once a month  . Drug use: Yes    Types: Marijuana    Comment: Every 2-3 weeks   . Sexual activity: Yes  Other Topics Concern  . Not on file  Social History Narrative    Lives alone in [place].   Back at school, studying Public relations account executive. Unemployed.    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    Social Determinants of Health   Financial Resource Strain:   . Difficulty of Paying Living Expenses:   Food Insecurity:   . Worried About Charity fundraiser in the Last Year:   . Arboriculturist in the Last Year:   Transportation Needs:   . Film/video editor (Medical):   Marland Kitchen Lack of Transportation (Non-Medical):   Physical Activity:   . Days of Exercise per Week:   . Minutes of Exercise per Session:   Stress:   . Feeling of Stress :   Social Connections:   . Frequency of Communication with Friends and Family:   . Frequency of Social Gatherings with Friends and Family:   . Attends Religious Services:   . Active Member of Clubs or Organizations:   . Attends Archivist Meetings:   Marland Kitchen Marital Status:   Intimate Partner Violence:   . Fear of Current or Ex-Partner:   . Emotionally Abused:   Marland Kitchen Physically Abused:   . Sexually Abused:      Physical Exam   Vitals:   08/02/19 0100 08/02/19 0400  BP: (!) 106/92 108/86  Pulse: 97 85  Resp: 16 16  Temp:    SpO2: 100% 98%    CONSTITUTIONAL: Well-appearing, NAD NEURO:  Alert and oriented x 3, normal and symmetric strength and sensation, normal coordination, normal speech, no nystagmus EYES:  eyes equal and reactive ENT/NECK:  no LAD, no JVD CARDIO: Regular rate, well-perfused, normal S1 and S2 PULM:  CTAB no wheezing or rhonchi GI/GU:  normal bowel sounds, non-distended, non-tender MSK/SPINE:  No gross deformities, no edema SKIN:  no rash, atraumatic PSYCH:  Appropriate speech and behavior  *Additional and/or pertinent findings included in MDM below  Diagnostic and Interventional Summary    EKG Interpretation  Date/Time:    Ventricular Rate:    PR Interval:    QRS Duration:   QT Interval:    QTC Calculation:   R Axis:     Text Interpretation:        Labs Reviewed  COMPREHENSIVE METABOLIC PANEL -  Abnormal; Notable for the following components:      Result Value   Chloride 112 (*)    CO2 17 (*)    Glucose, Bld 101 (*)    BUN 30 (*)    Creatinine, Ser 2.28 (*)    Alkaline Phosphatase 233 (*)    Total Bilirubin 1.3 (*)    GFR calc non Af Amer 36 (*)    GFR calc Af Amer 41 (*)  All other components within normal limits  CBC WITH DIFFERENTIAL/PLATELET - Abnormal; Notable for the following components:   WBC 24.8 (*)    RBC 2.61 (*)    Hemoglobin 8.4 (*)    HCT 24.8 (*)    RDW 22.9 (*)    Neutro Abs 14.7 (*)    Lymphs Abs 7.1 (*)    Monocytes Absolute 2.1 (*)    Eosinophils Absolute 0.7 (*)    Abs Immature Granulocytes 0.17 (*)    All other components within normal limits  RETICULOCYTES - Abnormal; Notable for the following components:   Retic Ct Pct 7.2 (*)    RBC. 2.67 (*)    Retic Count, Absolute 191.2 (*)    Immature Retic Fract 41.4 (*)    All other components within normal limits    No orders to display    Medications  sodium chloride flush (NS) 0.9 % injection 10-40 mL (has no administration in time range)  sodium chloride flush (NS) 0.9 % injection 10-40 mL (has no administration in time range)  sodium chloride flush (NS) 0.9 % injection 3 mL (3 mLs Intravenous Given 08/02/19 0036)  sodium chloride 0.9 % bolus 1,000 mL (0 mLs Intravenous Stopped 08/02/19 0409)  meclizine (ANTIVERT) tablet 50 mg (50 mg Oral Given 08/02/19 0029)  HYDROmorphone (DILAUDID) injection 2 mg (2 mg Subcutaneous Given 08/02/19 0037)  diphenhydrAMINE (BENADRYL) capsule 25 mg (25 mg Oral Given 08/02/19 0029)  HYDROmorphone (DILAUDID) injection 1 mg (1 mg Intravenous Given 08/02/19 0208)  HYDROmorphone (DILAUDID) injection 1 mg (1 mg Intravenous Given 08/02/19 0370)     Procedures  /  Critical Care Procedures  ED Course and Medical Decision Making  I have reviewed the triage vital signs, the nursing notes, and pertinent available records from the EMR.  Listed above are laboratory and imaging  tests that I personally ordered, reviewed, and interpreted and then considered in my medical decision making (see below for details).      Sickle cell pain crisis, no evidence of septic joint, preserved range of motion of joints on exam.  Patient is mildly tachycardic on arrival but afebrile, in no acute distress.  Seems to also be having either some lightheadedness related to dehydration or vertigo.  My neurological exam is reassuring and the vertigo does seem to be triggered by certain head positions, making this much more likely to be peripheral in etiology.  Providing fluids, meclizine, pain medicine, will reassess.  Work-up revealing leukocytosis, otherwise unremarkable.  Patient denies any infectious symptoms, no fever here.  Unable to control patient's pain, will admit to hospital service for further care of sickle cell pain crisis.  Accepted for admission by Dr. Jonelle Sidle.  Barth Kirks. [place]onia Small, [place] [email or web address]  Final Clinical Impressions(s) / ED Diagnoses     ICD-10-CM   1. Sickle cell pain crisis [facility])  D57.00     ED Discharge Orders    None       Discharge Instructions Discussed with and Provided to Patient:   Discharge Instructions   None       Maudie Flakes, MD 08/02/19 4888    Maudie Flakes, MD 08/02/19 0730

## 2019-08-01 NOTE — Progress Notes (Signed)
Both CBC and type and screen labs were drawn. Since hemoglobin was 8.8, per provider, patient can be discharged. Patient alert, oriented and ambulatory on discharge.

## 2019-08-01 NOTE — ED Triage Notes (Signed)
Pt states he left doctor and his pain in legs and back has increased.

## 2019-08-02 DIAGNOSIS — D72829 Elevated white blood cell count, unspecified: Secondary | ICD-10-CM | POA: Diagnosis present

## 2019-08-02 DIAGNOSIS — D5700 Hb-SS disease with crisis, unspecified: Secondary | ICD-10-CM | POA: Diagnosis present

## 2019-08-02 DIAGNOSIS — D631 Anemia in chronic kidney disease: Secondary | ICD-10-CM | POA: Diagnosis present

## 2019-08-02 DIAGNOSIS — F17210 Nicotine dependence, cigarettes, uncomplicated: Secondary | ICD-10-CM | POA: Diagnosis present

## 2019-08-02 DIAGNOSIS — N184 Chronic kidney disease, stage 4 (severe): Secondary | ICD-10-CM | POA: Diagnosis present

## 2019-08-02 DIAGNOSIS — Z20822 Contact with and (suspected) exposure to covid-19: Secondary | ICD-10-CM | POA: Diagnosis present

## 2019-08-02 DIAGNOSIS — E871 Hypo-osmolality and hyponatremia: Secondary | ICD-10-CM | POA: Diagnosis present

## 2019-08-02 DIAGNOSIS — K219 Gastro-esophageal reflux disease without esophagitis: Secondary | ICD-10-CM | POA: Diagnosis present

## 2019-08-02 DIAGNOSIS — Z803 Family history of malignant neoplasm of breast: Secondary | ICD-10-CM | POA: Diagnosis not present

## 2019-08-02 DIAGNOSIS — G894 Chronic pain syndrome: Secondary | ICD-10-CM | POA: Diagnosis present

## 2019-08-02 DIAGNOSIS — L0201 Cutaneous abscess of face: Secondary | ICD-10-CM | POA: Diagnosis present

## 2019-08-02 LAB — COMPREHENSIVE METABOLIC PANEL
ALT: 21 U/L (ref 0–44)
AST: 30 U/L (ref 15–41)
Albumin: 3.8 g/dL (ref 3.5–5.0)
Alkaline Phosphatase: 233 U/L — ABNORMAL HIGH (ref 38–126)
Anion gap: 8 (ref 5–15)
BUN: 30 mg/dL — ABNORMAL HIGH (ref 6–20)
CO2: 17 mmol/L — ABNORMAL LOW (ref 22–32)
Calcium: 8.9 mg/dL (ref 8.9–10.3)
Chloride: 112 mmol/L — ABNORMAL HIGH (ref 98–111)
Creatinine, Ser: 2.28 mg/dL — ABNORMAL HIGH (ref 0.61–1.24)
GFR calc Af Amer: 41 mL/min — ABNORMAL LOW (ref >60)
GFR calc non Af Amer: 36 mL/min — ABNORMAL LOW (ref >60)
Glucose, Bld: 101 mg/dL — ABNORMAL HIGH (ref 70–99)
Potassium: 4.5 mmol/L (ref 3.5–5.1)
Sodium: 137 mmol/L (ref 135–145)
Total Bilirubin: 1.3 mg/dL — ABNORMAL HIGH (ref 0.3–1.2)
Total Protein: 7.6 g/dL (ref 6.5–8.1)

## 2019-08-02 LAB — CBC WITH DIFFERENTIAL/PLATELET
Abs Immature Granulocytes: 0.17 10*3/uL — ABNORMAL HIGH (ref 0.00–0.07)
Basophils Absolute: 0.1 10*3/uL (ref 0.0–0.1)
Basophils Relative: 0 %
Eosinophils Absolute: 0.7 10*3/uL — ABNORMAL HIGH (ref 0.0–0.5)
Eosinophils Relative: 3 %
HCT: 24.8 % — ABNORMAL LOW (ref 39.0–52.0)
Hemoglobin: 8.4 g/dL — ABNORMAL LOW (ref 13.0–17.0)
Immature Granulocytes: 1 %
Lymphocytes Relative: 29 %
Lymphs Abs: 7.1 10*3/uL — ABNORMAL HIGH (ref 0.7–4.0)
MCH: 32.2 pg (ref 26.0–34.0)
MCHC: 33.9 g/dL (ref 30.0–36.0)
MCV: 95 fL (ref 80.0–100.0)
Monocytes Absolute: 2.1 10*3/uL — ABNORMAL HIGH (ref 0.1–1.0)
Monocytes Relative: 8 %
Neutro Abs: 14.7 10*3/uL — ABNORMAL HIGH (ref 1.7–7.7)
Neutrophils Relative %: 59 %
Platelets: 292 10*3/uL (ref 150–400)
RBC: 2.61 MIL/uL — ABNORMAL LOW (ref 4.22–5.81)
RDW: 22.9 % — ABNORMAL HIGH (ref 11.5–15.5)
WBC: 24.8 10*3/uL — ABNORMAL HIGH (ref 4.0–10.5)
nRBC: 0.2 % (ref 0.0–0.2)

## 2019-08-02 LAB — CBC
HCT: 21.8 % — ABNORMAL LOW (ref 39.0–52.0)
Hemoglobin: 7.2 g/dL — ABNORMAL LOW (ref 13.0–17.0)
MCH: 31.3 pg (ref 26.0–34.0)
MCHC: 33 g/dL (ref 30.0–36.0)
MCV: 94.8 fL (ref 80.0–100.0)
Platelets: 230 10*3/uL (ref 150–400)
RBC: 2.3 MIL/uL — ABNORMAL LOW (ref 4.22–5.81)
RDW: 22.6 % — ABNORMAL HIGH (ref 11.5–15.5)
WBC: 20.6 10*3/uL — ABNORMAL HIGH (ref 4.0–10.5)
nRBC: 0.1 % (ref 0.0–0.2)

## 2019-08-02 LAB — CREATININE, SERUM
Creatinine, Ser: 2.1 mg/dL — ABNORMAL HIGH (ref 0.61–1.24)
GFR calc Af Amer: 46 mL/min — ABNORMAL LOW (ref >60)
GFR calc non Af Amer: 39 mL/min — ABNORMAL LOW (ref >60)

## 2019-08-02 LAB — RETICULOCYTES
Immature Retic Fract: 41.4 % — ABNORMAL HIGH (ref 2.3–15.9)
RBC.: 2.67 MIL/uL — ABNORMAL LOW (ref 4.22–5.81)
Retic Count, Absolute: 191.2 10*3/uL — ABNORMAL HIGH (ref 19.0–186.0)
Retic Ct Pct: 7.2 % — ABNORMAL HIGH (ref 0.4–3.1)

## 2019-08-02 LAB — SARS CORONAVIRUS 2 BY RT PCR (HOSPITAL ORDER, PERFORMED IN ~~LOC~~ HOSPITAL LAB): SARS Coronavirus 2: NEGATIVE

## 2019-08-02 MED ORDER — OXYCODONE HCL ER 15 MG PO T12A
15.0000 mg | EXTENDED_RELEASE_TABLET | Freq: Two times a day (BID) | ORAL | Status: DC
  Administered 2019-08-02 – 2019-08-04 (×5): 15 mg via ORAL
  Filled 2019-08-02 (×5): qty 1

## 2019-08-02 MED ORDER — HYDROMORPHONE HCL 1 MG/ML IJ SOLN
1.0000 mg | Freq: Once | INTRAMUSCULAR | Status: AC
  Administered 2019-08-02: 1 mg via INTRAVENOUS
  Filled 2019-08-02: qty 1

## 2019-08-02 MED ORDER — POLYETHYLENE GLYCOL 3350 17 G PO PACK: 17.0000 g | PACK | Freq: Every day | ORAL | Status: DC | PRN

## 2019-08-02 MED ORDER — SODIUM BICARBONATE 650 MG PO TABS
650.0000 mg | ORAL_TABLET | Freq: Two times a day (BID) | ORAL | Status: DC
  Administered 2019-08-02 – 2019-08-04 (×5): 650 mg via ORAL
  Filled 2019-08-02 (×5): qty 1

## 2019-08-02 MED ORDER — NORTRIPTYLINE HCL 25 MG PO CAPS
25.0000 mg | ORAL_CAPSULE | Freq: Two times a day (BID) | ORAL | Status: DC
  Administered 2019-08-02 – 2019-08-04 (×5): 25 mg via ORAL
  Filled 2019-08-02 (×5): qty 1

## 2019-08-02 MED ORDER — SODIUM CHLORIDE 0.9% FLUSH: 9.0000 mL | INTRAVENOUS | Status: DC | PRN

## 2019-08-02 MED ORDER — FOLIC ACID 1 MG PO TABS
1.0000 mg | ORAL_TABLET | Freq: Every day | ORAL | Status: DC
  Administered 2019-08-02 – 2019-08-04 (×3): 1 mg via ORAL
  Filled 2019-08-02 (×3): qty 1

## 2019-08-02 MED ORDER — ALLOPURINOL 100 MG PO TABS
100.0000 mg | ORAL_TABLET | Freq: Every day | ORAL | Status: DC
  Administered 2019-08-02 – 2019-08-04 (×3): 100 mg via ORAL
  Filled 2019-08-02 (×3): qty 1

## 2019-08-02 MED ORDER — ACETAMINOPHEN 500 MG PO TABS
500.0000 mg | ORAL_TABLET | Freq: Four times a day (QID) | ORAL | Status: DC | PRN
  Administered 2019-08-03: 500 mg via ORAL
  Filled 2019-08-02: qty 1

## 2019-08-02 MED ORDER — SODIUM CHLORIDE 0.9% FLUSH
10.0000 mL | Freq: Two times a day (BID) | INTRAVENOUS | Status: DC
  Administered 2019-08-02 – 2019-08-03 (×3): 10 mL

## 2019-08-02 MED ORDER — VITAMIN D3 25 MCG (1000 UNIT) PO TABS
2000.0000 [IU] | ORAL_TABLET | Freq: Every day | ORAL | Status: DC
  Administered 2019-08-02 – 2019-08-04 (×3): 2000 [IU] via ORAL
  Filled 2019-08-02 (×4): qty 2

## 2019-08-02 MED ORDER — ENOXAPARIN SODIUM 40 MG/0.4ML ~~LOC~~ SOLN
40.0000 mg | SUBCUTANEOUS | Status: DC
  Administered 2019-08-02 – 2019-08-03 (×2): 40 mg via SUBCUTANEOUS
  Filled 2019-08-02 (×2): qty 0.4

## 2019-08-02 MED ORDER — ENOXAPARIN SODIUM 40 MG/0.4ML ~~LOC~~ SOLN: 40.0000 mg | SUBCUTANEOUS | Status: DC

## 2019-08-02 MED ORDER — SODIUM CHLORIDE 0.9% FLUSH: 10.0000 mL | INTRAVENOUS | Status: DC | PRN

## 2019-08-02 MED ORDER — HYDROMORPHONE 1 MG/ML IV SOLN
INTRAVENOUS | Status: DC
  Administered 2019-08-02: 30 mg via INTRAVENOUS
  Administered 2019-08-02: 5 mg via INTRAVENOUS
  Administered 2019-08-03: 4 mg via INTRAVENOUS
  Administered 2019-08-03: 7.5 mg via INTRAVENOUS
  Administered 2019-08-03: 1.5 mg via INTRAVENOUS
  Administered 2019-08-03: 30 mg via INTRAVENOUS
  Administered 2019-08-03: 3.5 mg via INTRAVENOUS
  Administered 2019-08-03: 10.5 mg via INTRAVENOUS
  Administered 2019-08-04 (×2): 4 mg via INTRAVENOUS
  Filled 2019-08-02 (×2): qty 30

## 2019-08-02 MED ORDER — NALOXONE HCL 0.4 MG/ML IJ SOLN: 0.4000 mg | INTRAMUSCULAR | Status: DC | PRN

## 2019-08-02 MED ORDER — HYDROMORPHONE HCL 2 MG/ML IJ SOLN
2.0000 mg | Freq: Once | INTRAMUSCULAR | Status: AC
  Administered 2019-08-02: 2 mg via SUBCUTANEOUS
  Filled 2019-08-02: qty 1

## 2019-08-02 MED ORDER — SENNOSIDES-DOCUSATE SODIUM 8.6-50 MG PO TABS
1.0000 | ORAL_TABLET | Freq: Two times a day (BID) | ORAL | Status: DC
  Filled 2019-08-02 (×3): qty 1

## 2019-08-02 NOTE — ED Notes (Signed)
Meal given to patient 

## 2019-08-02 NOTE — H&P (Signed)
[name] Man. is an 39 y.o. male.    Chief Complaint: Pain in legs and back for 2 days  HPI: Patient is a 39 year old gentleman with known history of sickle cell disease who has had recurrent sickle cell crisis.  Patient came into the ER today with onset of pain in his lower leg back and arms.  Pain is consistent with his typical sickle cell disease crisis.  He has taken his home regimen without any relief.  Patient has NSAIDs allergies and could only take his oxycodone.  In the ER he has had multiple doses of Dilaudid with no relief.  He has had some partial relief but the pain is still persistent.  He is being admitted to the hospital for ongoing treatment.  Denied any fever or chills no nausea vomiting or diarrhea.  Past Medical History:  Diagnosis Date  . Abscess of right iliac fossa 09/24/2018   required Perc Drain 09/24/2018  . Arachnoid Cyst of brain bilaterally    "2 really small ones in the back of my head; inside; saw them w/MRI" (09/25/2012)  . Bacterial pneumonia ~ 2012   "caught it here in the hospital" (09/25/2012)  . Chronic kidney disease    "from my sickle cell" (09/25/2012)  . CKD (chronic kidney disease), stage II   . Colitis 04/19/2017   CT scan abd/ pelvis  . GERD (gastroesophageal reflux disease)    "after I eat alot of spicey foods" (09/25/2012)  . Gynecomastia, male 07/10/2012  . History of blood transfusion    "always related to sickle cell crisis" (09/25/2012)  . 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   Noted in noted from Hematology notes at [facility]  . Migraines    "take RX qd to prevent them" (09/25/2012)  . Opioid dependence with withdrawal ([place]) 08/30/2012  . Renal insufficiency   . Sickle cell anemia (HCC)   . Sickle cell crisis ([place]) 09/25/2012  . Sickle cell nephropathy ([place]) 07/10/2012  . Sinus tachycardia   . Tachycardia with heart rate 121-140 beats per minute with ambulation 08/04/2016    Past Surgical History:  Procedure  Laterality Date  . ABCESS DRAINAGE    . CHOLECYSTECTOMY  ~ 2012  . COLONOSCOPY N/A 04/23/2017   Procedure: COLONOSCOPY;  Surgeon: Irene Shipper, MD;  Location: [facility];  Service: Endoscopy;  Laterality: N/A;  . IR FLUORO GUIDE CV LINE RIGHT  12/17/2016  . IR RADIOLOGIST EVAL & MGMT  10/02/2018  . IR RADIOLOGIST EVAL & MGMT  10/15/2018  . IR REMOVAL TUN CV CATH W/O FL  12/21/2016  . IR US GUIDE VASC ACCESS RIGHT  12/17/2016  . spleenectomy      Family History  Problem Relation Age of Onset  . Breast cancer Mother    Social History:  reports that he has been smoking cigarettes. He has been smoking about 0.10 packs per day. He has never used smokeless tobacco. He reports current alcohol use. He reports current drug use. Drug: Marijuana.  Allergies:  Allergies  Allergen Reactions  . Nsaids Other (See Comments)    Kidney disease  . Morphine And Related Other (See Comments)    "Real bad headaches"     (Not in a hospital admission)   Results for orders placed or performed during the hospital encounter of 08/01/19 (from the past 48 hour(s))  Comprehensive metabolic panel     Status: Abnormal   Collection Time: 08/02/19 12:19 AM  Result Value Ref Range   Sodium 137 135 -  145 mmol/L   Potassium 4.5 3.5 - 5.1 mmol/L   Chloride 112 (H) 98 - 111 mmol/L   CO2 17 (L) 22 - 32 mmol/L   Glucose, Bld 101 (H) 70 - 99 mg/dL    Comment: Glucose reference range applies only to samples taken after fasting for at least 8 hours.   BUN 30 (H) 6 - 20 mg/dL   Creatinine, Ser 2.28 (H) 0.61 - 1.24 mg/dL   Calcium 8.9 8.9 - 10.3 mg/dL   Total Protein 7.6 6.5 - 8.1 g/dL   Albumin 3.8 3.5 - 5.0 g/dL   AST 30 15 - 41 U/L   ALT 21 0 - 44 U/L   Alkaline Phosphatase 233 (H) 38 - 126 U/L   Total Bilirubin 1.3 (H) 0.3 - 1.2 mg/dL   GFR calc non Af Amer 36 (L) >60 mL/min   GFR calc Af Amer 41 (L) >60 mL/min   Anion gap 8 5 - 15    Comment: Performed at [facility], [place] [address]  [place], [place] [address]  CBC with Differential     Status: Abnormal   Collection Time: 08/02/19 12:19 AM  Result Value Ref Range   WBC 24.8 (H) 4.0 - 10.5 K/uL   RBC 2.61 (L) 4.22 - 5.81 MIL/uL   Hemoglobin 8.4 (L) 13.0 - 17.0 g/dL   HCT 24.8 (L) 39 - 52 %   MCV 95.0 80.0 - 100.0 fL   MCH 32.2 26.0 - 34.0 pg   MCHC 33.9 30.0 - 36.0 g/dL   RDW 22.9 (H) 11.5 - 15.5 %   Platelets 292 150 - 400 K/uL   nRBC 0.2 0.0 - 0.2 %   Neutrophils Relative % 59 %   Neutro Abs 14.7 (H) 1.7 - 7.7 K/uL   Lymphocytes Relative 29 %   Lymphs Abs 7.1 (H) 0.7 - 4.0 K/uL   Monocytes Relative 8 %   Monocytes Absolute 2.1 (H) 0 - 1 K/uL   Eosinophils Relative 3 %   Eosinophils Absolute 0.7 (H) 0 - 0 K/uL   Basophils Relative 0 %   Basophils Absolute 0.1 0 - 0 K/uL   Immature Granulocytes 1 %   Abs Immature Granulocytes 0.17 (H) 0.00 - 0.07 K/uL   Pappenheimer Bodies PRESENT    Tammy Sours Bodies PRESENT    Polychromasia PRESENT    Sickle Cells PRESENT    Target Cells PRESENT     Comment: Performed at [facility], [place] [address] [place], [place] [address]  Reticulocytes     Status: Abnormal   Collection Time: 08/02/19 12:19 AM  Result Value Ref Range   Retic Ct Pct 7.2 (H) 0.4 - 3.1 %   RBC. 2.67 (L) 4.22 - 5.81 MIL/uL   Retic Count, Absolute 191.2 (H) 19.0 - 186.0 K/uL   Immature Retic Fract 41.4 (H) 2.3 - 15.9 %    Comment: Performed at [facility], [place] [address], [place], [place] [address]   No results found.  Review of Systems  Constitutional: Positive for fatigue.  HENT: Negative.   Eyes: Negative.   Respiratory: Negative.   Cardiovascular: Negative.   Gastrointestinal: Negative.   Endocrine: Negative.   Genitourinary: Negative.   Musculoskeletal: Positive for arthralgias, joint swelling and myalgias.  Skin: Negative.   Allergic/Immunologic: Negative.   Neurological: Negative.   Hematological: Negative.   Psychiatric/Behavioral: Negative.      Blood pressure 106/74, pulse 85, temperature 98.1 F (36.7 C), temperature  source Oral, resp. rate 16, SpO2 100 %. Physical Exam  Constitutional: He appears ill.  HENT:  Head: Normocephalic and atraumatic.  Right Ear: Tympanic membrane normal.  Left Ear: Tympanic membrane normal.  Nose: Nose normal.  Mouth/Throat: Mucous membranes are moist.  Eyes: Pupils are equal, round, and reactive to light.  Cardiovascular: Normal rate, regular rhythm, normal heart sounds and normal pulses.  Respiratory: Effort normal and breath sounds normal.  GI: Soft. Normal appearance and bowel sounds are normal.  Musculoskeletal:        General: Normal range of motion.     Cervical back: Normal range of motion and neck supple.  Neurological: He is alert.  Skin: Skin is warm and dry.  Psychiatric: Mood normal.     Assessment/Plan 39 year old here with sickle cell painful crisis.  #1 sickle cell painful crisis: Patient will be admitted for pain control and management.  Hemodynamically stable H&H stable.  Initiate D5 half-normal saline at 125/min.  IV Dilaudid PCA.  No Toradol.  Continue with his long-acting pain medications.  Occasional Dilaudid as needed if needed.  #2 anemia of chronic disease: H&H is stable at baseline.  Continue treatment  #3 tobacco abuse: Patient offered nicotine patch.  Tobacco cessation counseling given.  #4 leukocytosis: Chronic leukocytosis.  Secondary to vaso-occlusive disease.  Continue to monitor  #5 chronic kidney disease stage IV: Patient has advanced renal disease.  Well-known.  BUN/creatinine still at baseline.  Continue treatment  Jimmy Plessinger,LAWAL, MD 08/02/2019, 9:12 AM

## 2019-08-02 NOTE — Progress Notes (Signed)
Report received from ED 

## 2019-08-03 LAB — CBC WITH DIFFERENTIAL/PLATELET
Abs Immature Granulocytes: 0.13 10*3/uL — ABNORMAL HIGH (ref 0.00–0.07)
Basophils Absolute: 0.1 10*3/uL (ref 0.0–0.1)
Basophils Relative: 0 %
Eosinophils Absolute: 0.7 10*3/uL — ABNORMAL HIGH (ref 0.0–0.5)
Eosinophils Relative: 3 %
HCT: 22.1 % — ABNORMAL LOW (ref 39.0–52.0)
Hemoglobin: 7.2 g/dL — ABNORMAL LOW (ref 13.0–17.0)
Immature Granulocytes: 1 %
Lymphocytes Relative: 36 %
Lymphs Abs: 8.5 10*3/uL — ABNORMAL HIGH (ref 0.7–4.0)
MCH: 31.2 pg (ref 26.0–34.0)
MCHC: 32.6 g/dL (ref 30.0–36.0)
MCV: 95.7 fL (ref 80.0–100.0)
Monocytes Absolute: 2.4 10*3/uL — ABNORMAL HIGH (ref 0.1–1.0)
Monocytes Relative: 10 %
Neutro Abs: 12 10*3/uL — ABNORMAL HIGH (ref 1.7–7.7)
Neutrophils Relative %: 50 %
Platelets: 241 10*3/uL (ref 150–400)
RBC: 2.31 MIL/uL — ABNORMAL LOW (ref 4.22–5.81)
RDW: 22.3 % — ABNORMAL HIGH (ref 11.5–15.5)
WBC: 23.8 10*3/uL — ABNORMAL HIGH (ref 4.0–10.5)
nRBC: 0.1 % (ref 0.0–0.2)

## 2019-08-03 LAB — COMPREHENSIVE METABOLIC PANEL
ALT: 23 U/L (ref 0–44)
AST: 38 U/L (ref 15–41)
Albumin: 3.4 g/dL — ABNORMAL LOW (ref 3.5–5.0)
Alkaline Phosphatase: 219 U/L — ABNORMAL HIGH (ref 38–126)
Anion gap: 4 — ABNORMAL LOW (ref 5–15)
BUN: 26 mg/dL — ABNORMAL HIGH (ref 6–20)
CO2: 17 mmol/L — ABNORMAL LOW (ref 22–32)
Calcium: 8.6 mg/dL — ABNORMAL LOW (ref 8.9–10.3)
Chloride: 110 mmol/L (ref 98–111)
Creatinine, Ser: 2.05 mg/dL — ABNORMAL HIGH (ref 0.61–1.24)
GFR calc Af Amer: 47 mL/min — ABNORMAL LOW (ref >60)
GFR calc non Af Amer: 40 mL/min — ABNORMAL LOW (ref >60)
Glucose, Bld: 80 mg/dL (ref 70–99)
Potassium: 4.1 mmol/L (ref 3.5–5.1)
Sodium: 131 mmol/L — ABNORMAL LOW (ref 135–145)
Total Bilirubin: 1.9 mg/dL — ABNORMAL HIGH (ref 0.3–1.2)
Total Protein: 6.7 g/dL (ref 6.5–8.1)

## 2019-08-03 MED ORDER — HYDROMORPHONE HCL 2 MG/ML IJ SOLN
2.0000 mg | Freq: Once | INTRAMUSCULAR | Status: AC
  Administered 2019-08-03: 2 mg via INTRAVENOUS

## 2019-08-03 NOTE — Progress Notes (Signed)
Patient states he will not wear the SCD's, educated the importance of them.

## 2019-08-03 NOTE — Progress Notes (Signed)
Subjective: [name] admitted with sickle cell painful crisis.  Patient is still complaining of 7 out of 10 pain.  His pain is much better.  Also complaining of swelling on his left cheekbone.  Otherwise stable.  Using Dilaudid PCA as prescribed.  Objective: Vital signs in last 24 hours: Temp:  [97.9 F (36.6 C)-98.5 F (36.9 C)] 98.5 F (36.9 C) (06/20 0935) Pulse Rate:  [88-110] 110 (06/20 0935) Resp:  [14-18] 17 (06/20 0532) BP: (92-120)/(76-96) 114/96 (06/20 0935) SpO2:  [96 %-100 %] 100 % (06/20 0935) Weight:  [54.5 kg-56.7 kg] 54.5 kg (06/20 0532) Weight change:  Last BM Date: 08/01/19  Intake/Output from previous day: 06/19 0701 - 06/20 0700 In: -  Out: 900 [Urine:900] Intake/Output this shift: Total I/O In: -  Out: 400 [Urine:400]  General appearance: alert, cooperative, appears stated age and no distress Neck: no adenopathy, no carotid bruit, no JVD, supple, symmetrical, trachea midline and thyroid not enlarged, symmetric, no tenderness/mass/nodules Back: symmetric, no curvature. ROM normal. No CVA tenderness. Resp: clear to auscultation bilaterally Cardio: regular rate and rhythm, S1, S2 normal, no murmur, click, rub or gallop GI: soft, non-tender; bowel sounds normal; no masses,  no organomegaly Extremities: extremities normal, atraumatic, no cyanosis or edema Pulses: 2+ and symmetric Skin: Skin color, texture, turgor normal. No rashes or lesions Neurologic: Grossly normal  Lab Results: Recent Labs    08/02/19 1002 08/03/19 0500  WBC 20.6* 23.8*  HGB 7.2* 7.2*  HCT 21.8* 22.1*  PLT 230 241   BMET Recent Labs    08/02/19 0019 08/02/19 0019 08/02/19 1002 08/03/19 0500  NA 137  --   --  131*  K 4.5  --   --  4.1  CL 112*  --   --  110  CO2 17*  --   --  17*  GLUCOSE 101*  --   --  80  BUN 30*  --   --  26*  CREATININE 2.28*   < > 2.10* 2.05*  CALCIUM 8.9  --   --  8.6*   < > = values in this interval not displayed.     Studies/Results: No results found.  Medications: I have reviewed the patient's current medications.  Assessment/Plan: [name] admitted with sickle cell painful crisis.  #1 sickle cell painful crisis: Patient is doing better with current regimen.  Continue Dilaudid PCA no Toradol due to renal function defect.  He is long-acting pain medication.  May start his short acting pain medicine in the morning.  Continue IV fluids.  #2 anemia of chronic disease: Hemoglobin has remained stable at 7.2.  Continue monitoring.  #3 leukocytosis: Due to chronic vaso-occlusive crisis.  White count has gone up to 23.8.  Monitor.  No evidence of infection.  #4 chronic kidney disease stage IV: BUN/creatinine close to baseline.  Monitor closely.  #5 hyponatremia: Monitor sodium level.  #6 tobacco abuse: Nicotine patch.  LOS: 1 day   Gratia Disla,LAWAL 08/03/2019, 9:41 AM

## 2019-08-04 DIAGNOSIS — N184 Chronic kidney disease, stage 4 (severe): Secondary | ICD-10-CM

## 2019-08-04 DIAGNOSIS — L0201 Cutaneous abscess of face: Secondary | ICD-10-CM

## 2019-08-04 DIAGNOSIS — D5700 Hb-SS disease with crisis, unspecified: Principal | ICD-10-CM

## 2019-08-04 LAB — BASIC METABOLIC PANEL
Anion gap: 11 (ref 5–15)
BUN: 27 mg/dL — ABNORMAL HIGH (ref 6–20)
CO2: 15 mmol/L — ABNORMAL LOW (ref 22–32)
Calcium: 9.2 mg/dL (ref 8.9–10.3)
Chloride: 111 mmol/L (ref 98–111)
Creatinine, Ser: 2.32 mg/dL — ABNORMAL HIGH (ref 0.61–1.24)
GFR calc Af Amer: 40 mL/min — ABNORMAL LOW (ref >60)
GFR calc non Af Amer: 35 mL/min — ABNORMAL LOW (ref >60)
Glucose, Bld: 88 mg/dL (ref 70–99)
Potassium: 4.8 mmol/L (ref 3.5–5.1)
Sodium: 137 mmol/L (ref 135–145)

## 2019-08-04 LAB — CBC
HCT: 22.1 % — ABNORMAL LOW (ref 39.0–52.0)
Hemoglobin: 7.1 g/dL — ABNORMAL LOW (ref 13.0–17.0)
MCH: 30.7 pg (ref 26.0–34.0)
MCHC: 32.1 g/dL (ref 30.0–36.0)
MCV: 95.7 fL (ref 80.0–100.0)
Platelets: 247 10*3/uL (ref 150–400)
RBC: 2.31 MIL/uL — ABNORMAL LOW (ref 4.22–5.81)
RDW: 22.2 % — ABNORMAL HIGH (ref 11.5–15.5)
WBC: 26.3 10*3/uL — ABNORMAL HIGH (ref 4.0–10.5)
nRBC: 0.2 % (ref 0.0–0.2)

## 2019-08-04 MED ORDER — DOXYCYCLINE HYCLATE 100 MG PO TABS
100.0000 mg | ORAL_TABLET | Freq: Two times a day (BID) | ORAL | Status: DC
  Administered 2019-08-04: 100 mg via ORAL
  Filled 2019-08-04: qty 1

## 2019-08-04 MED ORDER — DOXYCYCLINE HYCLATE 100 MG PO TABS: 100.0000 mg | ORAL_TABLET | Freq: Two times a day (BID) | ORAL | 0 refills | Status: AC

## 2019-08-04 NOTE — Discharge Summary (Signed)
Physician Discharge Summary  [name] [name]. XNA:[id number] DOB: February 18, 1980 DOA: 08/01/2019  PCP: [name] Garter, MD  Admit date: 08/01/2019  Discharge date: 08/04/2019  Discharge Diagnoses:  Principal Problem:   Sickle cell anemia with crisis ([place]) Active Problems:   TOBACCO ABUSE   CKD (chronic kidney disease), stage IV (HCC)   GERD (gastroesophageal reflux disease)   Facial abscess   Discharge Condition: Stable  Disposition:   Follow-up Information    [name] Dresser, MD .   Specialty: Cardiology Contact information: [address] [place] [address] [phone]        [name] Dew, FNP Follow up.   Specialty: Family Medicine Why: Follow up as scheduled Contact information: [address] [place] [address] [phone]              Pt is discharged home in good condition and is to follow up with [name] Garter, MD this week to have labs evaluated. [name] A Koffman Jr. is instructed to increase activity slowly and balance with rest for the next few days, and use prescribed medication to complete treatment of pain  Diet: Regular Wt Readings from Last 3 Encounters:  08/04/19 53 kg  06/28/19 49 kg  06/13/19 55.3 kg    History of present illness:  [name] [name] is a 39 year old male with a medical history significant for sickle cell disease type SS, chronic pain syndrome, opiate dependence and tolerance, history of polysubstance abuse, tobacco dependence, history of anemia of chronic disease, and history of stage III chronic kidney disease presented to ER with onset of pain in low back, lower extremities, and upper extremities.  Pain is consistent with typical sickle cell pain crisis.  He has taken home medication regimen without any relief.  Patient has allergies to NSAIDs and could only take oxycodone.  In the ER, patient had multiple doses of IV Dilaudid without relief.  He has had some partial  relief, but pain is still fairly persistent.  He is being admitted to hospital for ongoing treatment.  He denies any fever, chills, nausea, vomiting, or diarrhea.  ER course: Vital signs found to be blood pressure 104/67, temperature 98.1 F, heart rate 106, respirations 16, and oxygen saturation 94% on RA.  WBCs 23.8 and hemoglobin is 7.2.  Sodium 131, creatinine 2.05, and total bilirubin 1.9.  Pain persists despite several doses of IV Dilaudid and IV fluids.  Patient admitted to [facility] for management of pain crisis.   Hospital Course:  Sickle cell anemia with pain crisis: Patient was admitted with sickle cell pain crisis and managed appropriately with IV fluids, IV Dilaudid via PCA, as well as other adjunct therapies per sickle cell management protocols. Patient states that pain is mostly controlled with IV Dilaudid PCA and is requesting discharge home.  Pain intensity is 3/10 primarily to low back and lower extremities.  Patient states that he can manage pain at home on current medication regimen.  He will follow-up with PCP in clinic in 1 week for medication management.  Sickle cell anemia: Hemoglobin is 7.2, which is consistent with patient's baseline.  There was no clinical indication for blood transfusion during this admission.  Left facial abscess: Simple I&D at bedside today.  Will follow wound culture.  Purulent drainage, moderate, some induration, tender to palpation.  Patient will discharge with doxycycline 100 mg twice daily for a total of 7 days. Patient will follow up with PCP on tomorrow for this problem.  Chronic kidney disease stage III:  [name] [name] currently has a creatinine of 2.05, which is consistent with his baseline.  Patient is followed by [facility] monthly.  Patient advised to follow-up as scheduled.  Leukocytosis: Patient has chronic leukocytosis.  WBCs elevated to 21.1.  Patient remained afebrile.  Left facial abscess with purulent drainage.   Doxycycline initiated at discharge.  Will follow wound culture.  Patient will follow-up with PCP to repeat CBC in 1 week.  Patient is alert, oriented, and ambulating without assistance.  He will discharge home in a hemodynamically stable condition.  Discharge Exam: Vitals:   08/04/19 0959 08/04/19 1103  BP: 109/73   Pulse: 100   Resp: 16 13  Temp: 98 F (36.7 C)   SpO2: 100% 99%   Vitals:   08/04/19 0406 08/04/19 0615 08/04/19 0959 08/04/19 1103  BP:  116/77 109/73   Pulse:  (!) 101 100   Resp: 14 18 16 13   Temp:  98.4 F (36.9 C) 98 F (36.7 C)   TempSrc:  Oral Oral   SpO2: 100% 98% 100% 99%  Weight:  53 kg    Height:        General appearance : Awake, alert, not in any distress. Speech Clear. Not toxic looking HEENT: Atraumatic and Normocephalic, pupils equally reactive to light and accomodation Neck: Supple, no JVD. No cervical lymphadenopathy.  Chest: Good air entry bilaterally, no added sounds  CVS: S1 S2 regular, no murmurs.  Abdomen: Bowel sounds present, Non tender and not distended with no gaurding, rigidity or rebound. Extremities: B/L Lower Ext shows no edema, both legs are warm to touch Neurology: Awake alert, and oriented X 3, CN II-XII intact, Non focal Skin: No Rash  Discharge Instructions  Discharge Instructions    Discharge patient   Complete by: As directed    Discharge disposition: 01-Home or Self Care   Discharge patient date: 08/04/2019     Allergies as of 08/04/2019      Reactions   Nsaids Other (See Comments)   Kidney disease   Morphine And Related Other (See Comments)   "Real bad headaches"       Medication List    TAKE these medications   acetaminophen 500 MG tablet Commonly known as: TYLENOL Take 1 tablet (500 mg total) by mouth every 6 (six) hours as needed.   ALLOPURINOL PO Take 1 tablet by mouth daily.   doxycycline 100 MG tablet Commonly known as: VIBRA-TABS Take 1 tablet (100 mg total) by mouth 2 (two) times daily for 7  days.   folic acid 1 MG tablet Commonly known as: FOLVITE Take 1 tablet (1 mg total) by mouth daily.   gabapentin 300 MG capsule Commonly known as: NEURONTIN Take 1 capsule (300 mg total) by mouth 2 (two) times daily as needed.   nortriptyline 25 MG capsule Commonly known as: PAMELOR Take 1 capsule (25 mg total) by mouth 2 (two) times daily.   Oxycodone HCl 10 MG Tabs Take 1 tablet (10 mg total) by mouth every 6 (six) hours as needed.   oxyCODONE 15 mg 12 hr tablet Commonly known as: OxyCONTIN Take 1 tablet (15 mg total) by mouth every 12 (twelve) hours.   sodium bicarbonate 650 MG tablet Take 650 mg by mouth 2 (two) times daily.   Vitamin D3 50 MCG (2000 UT) Tabs Take 2 tablets by mouth daily.       The results of significant diagnostics from this hospitalization (including imaging, microbiology,  ancillary and laboratory) are listed below for reference.    Significant Diagnostic Studies: No results found.  Microbiology: Recent Results (from the past 240 hour(s))  SARS Coronavirus 2 by RT PCR (hospital order, performed in [facility] hospital lab) Nasopharyngeal Nasopharyngeal Swab     Status: None   Collection Time: 08/02/19  3:42 PM   Specimen: Nasopharyngeal Swab  Result Value Ref Range Status   SARS Coronavirus 2 NEGATIVE NEGATIVE Final    Comment: (NOTE) SARS-CoV-2 target nucleic acids are NOT DETECTED.  The SARS-CoV-2 RNA is generally detectable in upper and lower respiratory specimens during the acute phase of infection. The lowest concentration of SARS-CoV-2 viral copies this assay can detect is 250 copies / mL. A negative result does not preclude SARS-CoV-2 infection and should not be used as the sole basis for treatment or other patient management decisions.  A negative result may occur with improper specimen collection / handling, submission of specimen other than nasopharyngeal swab, presence of viral mutation(s) within the areas targeted by this  assay, and inadequate number of viral copies (<250 copies / mL). A negative result must be combined with clinical observations, patient history, and epidemiological information.  Fact Sheet for Patients:   StrictlyIdeas.no  Fact Sheet for Healthcare Providers: BankingDealers.co.za  This test is not yet approved or  cleared by the [place] FDA and has been authorized for detection and/or diagnosis of SARS-CoV-2 by FDA under an Emergency Use Authorization (EUA).  This EUA will remain in effect (meaning this test can be used) for the duration of the COVID-19 declaration under Section 564(b)(1) of the Act, 21 U.S.C. section 360bbb-3(b)(1), unless the authorization is terminated or revoked sooner.  Performed at [facility], [place] [address] [place], [place] [address]   Aerobic Culture (superficial specimen)     Status: None (Preliminary result)   Collection Time: 08/04/19 11:56 AM   Specimen: Abscess; Wound  Result Value Ref Range Status   Specimen Description   Final    ABSCESS FACE Performed at [facility] [address] [place], [place] [address]    Special Requests   Final    Normal Performed at [facility], [place] [address], [place], [place] [address]    Gram Stain   Final    FEW WBC PRESENT, PREDOMINANTLY PMN FEW GRAM POSITIVE COCCI IN CLUSTERS Performed at [facility], [place] [address] [place], [place] [address]    Culture PENDING  Incomplete   Report Status PENDING  Incomplete     Labs: Basic Metabolic Panel: Recent Labs  Lab 08/02/19 0019 08/02/19 0019 08/02/19 1002 08/03/19 0500 08/04/19 1100  NA 137  --   --  131* 137  K 4.5   < >  --  4.1 4.8  CL 112*  --   --  110 111  CO2 17*  --   --  17* 15*  GLUCOSE 101*  --   --  80 88  BUN 30*  --   --  26* 27*  CREATININE 2.28*  --  2.10* 2.05* 2.32*  CALCIUM 8.9  --   --  8.6* 9.2   < > =  values in this interval not displayed.   Liver Function Tests: Recent Labs  Lab 08/02/19 0019 08/03/19 0500  AST 30 38  ALT 21 23  ALKPHOS 233* 219*  BILITOT 1.3* 1.9*  PROT 7.6 6.7  ALBUMIN 3.8 3.4*   No results for input(s): LIPASE, AMYLASE  in the last 168 hours. No results for input(s): AMMONIA in the last 168 hours. CBC: Recent Labs  Lab 08/01/19 1148 08/02/19 0019 08/02/19 1002 08/03/19 0500 08/04/19 1100  WBC 21.1* 24.8* 20.6* 23.8* 26.3*  NEUTROABS 13.2* 14.7*  --  12.0*  --   HGB 8.8* 8.4* 7.2* 7.2* 7.1*  HCT 26.5* 24.8* 21.8* 22.1* 22.1*  MCV 94.0 95.0 94.8 95.7 95.7  PLT 285 292 230 241 247   Cardiac Enzymes: No results for input(s): CKTOTAL, CKMB, CKMBINDEX, TROPONINI in the last 168 hours. BNP: Invalid input(s): POCBNP CBG: No results for input(s): GLUCAP in the last 168 hours.  Time coordinating discharge: 50 minutes  Signed: [name]  APRN, MSN, FNP-C [facility] [address] [place], [place] [address] [phone]   [facility] 08/04/2019, 4:59 PM

## 2019-08-04 NOTE — Discharge Instructions (Signed)
Doxycycline 100 mg twice daily for a total of 7 days. Follow up in clinic as scheduled.   Skin Abscess  A skin abscess is an infected area on or under your skin that contains a collection of pus and other material. An abscess may also be called a furuncle, carbuncle, or boil. An abscess can occur in or on almost any part of your body. Some abscesses break open (rupture) on their own. Most continue to get worse unless they are treated. The infection can spread deeper into the body and eventually into your blood, which can make you feel ill. Treatment usually involves draining the abscess. What are the causes? An abscess occurs when germs, like bacteria, pass through your skin and cause an infection. This may be caused by:  A scrape or cut on your skin.  A puncture wound through your skin, including a needle injection or insect bite.  Blocked oil or sweat glands.  Blocked and infected hair follicles.  A cyst that forms beneath your skin (sebaceous cyst) and becomes infected. What increases the risk? This condition is more likely to develop in people who:  Have a weak body defense system (immune system).  Have diabetes.  Have dry and irritated skin.  Get frequent injections or use illegal IV drugs.  Have a foreign body in a wound, such as a splinter.  Have problems with their lymph system or veins. What are the signs or symptoms? Symptoms of this condition include:  A painful, firm bump under the skin.  A bump with pus at the top. This may break through the skin and drain. Other symptoms include:  Redness surrounding the abscess site.  Warmth.  Swelling of the lymph nodes (glands) near the abscess.  Tenderness.  A sore on the skin. How is this diagnosed? This condition may be diagnosed based on:  A physical exam.  Your medical history.  A sample of pus. This may be used to find out what is causing the infection.  Blood tests.  Imaging tests, such as an  ultrasound, CT scan, or MRI. How is this treated? A small abscess that drains on its own may not need treatment. Treatment for larger abscesses may include:  Moist heat or heat pack applied to the area several times a day.  A procedure to drain the abscess (incision and drainage).  Antibiotic medicines. For a severe abscess, you may first get antibiotics through an IV and then change to antibiotics by mouth. Follow these instructions at home: Medicines   Take over-the-counter and prescription medicines only as told by your health care provider.  If you were prescribed an antibiotic medicine, take it as told by your health care provider. Do not stop taking the antibiotic even if you start to feel better. Abscess care   If you have an abscess that has not drained, apply heat to the affected area. Use the heat source that your health care provider recommends, such as a moist heat pack or a heating pad. ? Place a towel between your skin and the heat source. ? Leave the heat on for 20-30 minutes. ? Remove the heat if your skin turns bright red. This is especially important if you are unable to feel pain, heat, or cold. You may have a greater risk of getting burned.  Follow instructions from your health care provider about how to take care of your abscess. Make sure you: ? Cover the abscess with a bandage (dressing). ? Change your dressing or gauze  as told by your health care provider. ? Wash your hands with soap and water before you change the dressing or gauze. If soap and water are not available, use hand sanitizer.  Check your abscess every day for signs of a worsening infection. Check for: ? More redness, swelling, or pain. ? More fluid or blood. ? Warmth. ? More pus or a bad smell. General instructions  To avoid spreading the infection: ? Do not share personal care items, towels, or hot tubs with others. ? Avoid making skin contact with other people.  Keep all follow-up visits  as told by your health care provider. This is important. Contact a health care provider if you have:  More redness, swelling, or pain around your abscess.  More fluid or blood coming from your abscess.  Warm skin around your abscess.  More pus or a bad smell coming from your abscess.  A fever.  Muscle aches.  Chills or a general ill feeling. Get help right away if you:  Have severe pain.  See red streaks on your skin spreading away from the abscess. Summary  A skin abscess is an infected area on or under your skin that contains a collection of pus and other material.  A small abscess that drains on its own may not need treatment.  Treatment for larger abscesses may include having a procedure to drain the abscess and taking an antibiotic. This information is not intended to replace advice given to you by your health care provider. Make sure you discuss any questions you have with your health care provider. Document Revised: 05/23/2018 Document Reviewed: 03/15/2017 Elsevier Patient Education  2020 [employer].

## 2019-08-04 NOTE — Progress Notes (Signed)
Discharge instructions explained to patient and the importance of taking all of his antibiotics that have been ordered. Midline Discontinued. [place] in to drain abcess on patient's Left cheek area. Dressing applied. He was instructed not to squeeze abcess. Started on Doxycycline before leaving the hospital, prescription called in to patient's pharmacy. Tim refused wheelchair at discharge, he wanted to walk out. Friend to pick him up and take him home.

## 2019-08-05 ENCOUNTER — Encounter: Payer: Self-pay | Admitting: Family Medicine

## 2019-08-05 ENCOUNTER — Other Ambulatory Visit: Payer: Self-pay

## 2019-08-05 ENCOUNTER — Ambulatory Visit (INDEPENDENT_AMBULATORY_CARE_PROVIDER_SITE_OTHER): Payer: Medicare Other | Admitting: Family Medicine

## 2019-08-05 VITALS — BP 98/67 | HR 113 | Temp 98.8°F | Ht 63.0 in | Wt 115.2 lb

## 2019-08-05 DIAGNOSIS — D5700 Hb-SS disease with crisis, unspecified: Secondary | ICD-10-CM

## 2019-08-05 DIAGNOSIS — G894 Chronic pain syndrome: Secondary | ICD-10-CM

## 2019-08-05 DIAGNOSIS — L0201 Cutaneous abscess of face: Secondary | ICD-10-CM

## 2019-08-05 DIAGNOSIS — D571 Sickle-cell disease without crisis: Secondary | ICD-10-CM

## 2019-08-05 MED ORDER — OXYCODONE HCL ER 15 MG PO T12A: 15.0000 mg | EXTENDED_RELEASE_TABLET | Freq: Two times a day (BID) | ORAL | 0 refills | Status: DC

## 2019-08-05 MED ORDER — OXYCODONE HCL 10 MG PO TABS: 10.0000 mg | ORAL_TABLET | Freq: Four times a day (QID) | ORAL | 0 refills | Status: DC | PRN

## 2019-08-06 ENCOUNTER — Telehealth: Payer: Self-pay

## 2019-08-06 LAB — AEROBIC CULTURE W GRAM STAIN (SUPERFICIAL SPECIMEN): Special Requests: NORMAL

## 2019-08-06 LAB — CBC
Hematocrit: 24 % — ABNORMAL LOW (ref 37.5–51.0)
Hemoglobin: 8.3 g/dL — ABNORMAL LOW (ref 13.0–17.7)
MCH: 31.3 pg (ref 26.6–33.0)
MCHC: 34.6 g/dL (ref 31.5–35.7)
MCV: 91 fL (ref 79–97)
Platelets: 291 10*3/uL (ref 150–450)
RBC: 2.65 x10E6/uL — CL (ref 4.14–5.80)
RDW: 20 % — ABNORMAL HIGH (ref 11.6–15.4)
WBC: 23.4 10*3/uL (ref 3.4–10.8)

## 2019-08-06 NOTE — Telephone Encounter (Signed)
-----   Message from [name], [place] sent at 08/06/2019  5:54 AM EDT ----- Regarding: lab results Please inform patient that WBC count continues to be elevated. Hemoglobin has improved to 8.3. Will repeat labs in 1 week as scheduled. Remind patient of the importance of completing antibiotics.   [name]  APRN, MSN, FNP-C Patient [facility] [address] [place], [place] [address] [phone]

## 2019-08-06 NOTE — Telephone Encounter (Signed)
-----   Message from [name], [place] sent at 08/06/2019  5:54 AM EDT ----- Regarding: lab results Please inform patient that WBC count continues to be elevated. Hemoglobin has improved to 8.3. Will repeat labs in 1 week as scheduled. Remind patient of the importance of completing antibiotics.   [name]  APRN, MSN, FNP-C Patient [facility] [address] [place], [place] [address] [phone]

## 2019-08-06 NOTE — Telephone Encounter (Signed)
Called and notified patient of his results informed of wbc count, and continue to take his antibiotic and follow up in 1 week to repeat labs bmp . Pt understood results, w/o any concerns.

## 2019-08-12 ENCOUNTER — Other Ambulatory Visit: Payer: Medicare Other

## 2019-08-12 ENCOUNTER — Other Ambulatory Visit: Payer: Self-pay

## 2019-08-12 DIAGNOSIS — D5700 Hb-SS disease with crisis, unspecified: Secondary | ICD-10-CM | POA: Diagnosis not present

## 2019-08-12 DIAGNOSIS — D72829 Elevated white blood cell count, unspecified: Secondary | ICD-10-CM

## 2019-08-13 LAB — CBC WITH DIFFERENTIAL/PLATELET
Basophils Absolute: 0 10*3/uL (ref 0.0–0.2)
Basos: 0 %
EOS (ABSOLUTE): 0.4 10*3/uL (ref 0.0–0.4)
Eos: 2 %
Hematocrit: 18.2 % — ABNORMAL LOW (ref 37.5–51.0)
Hemoglobin: 6.1 g/dL — CL (ref 13.0–17.7)
Immature Grans (Abs): 0.1 10*3/uL (ref 0.0–0.1)
Immature Granulocytes: 1 %
Lymphocytes Absolute: 3.8 10*3/uL — ABNORMAL HIGH (ref 0.7–3.1)
Lymphs: 20 %
MCH: 30.7 pg (ref 26.6–33.0)
MCHC: 33.5 g/dL (ref 31.5–35.7)
MCV: 92 fL (ref 79–97)
Monocytes Absolute: 1.3 10*3/uL — ABNORMAL HIGH (ref 0.1–0.9)
Monocytes: 7 %
Neutrophils Absolute: 13.8 10*3/uL — ABNORMAL HIGH (ref 1.4–7.0)
Neutrophils: 70 %
Platelets: 331 10*3/uL (ref 150–450)
RBC: 1.99 x10E6/uL — CL (ref 4.14–5.80)
RDW: 18.8 % — ABNORMAL HIGH (ref 11.6–15.4)
WBC: 19.3 10*3/uL — ABNORMAL HIGH (ref 3.4–10.8)

## 2019-08-13 IMAGING — US US ABDOMEN LIMITED
1 series · 14 of 25 positions shown · non-contrast
Comparison: None.

CLINICAL DATA: Elevated liver function tests, prior cholecystectomy

EXAM:
ULTRASOUND ABDOMEN LIMITED RIGHT UPPER QUADRANT

[Series 1: us abdomen limited · 0.25mm/px · 14 of 42 slices shown]
[im 1/42]
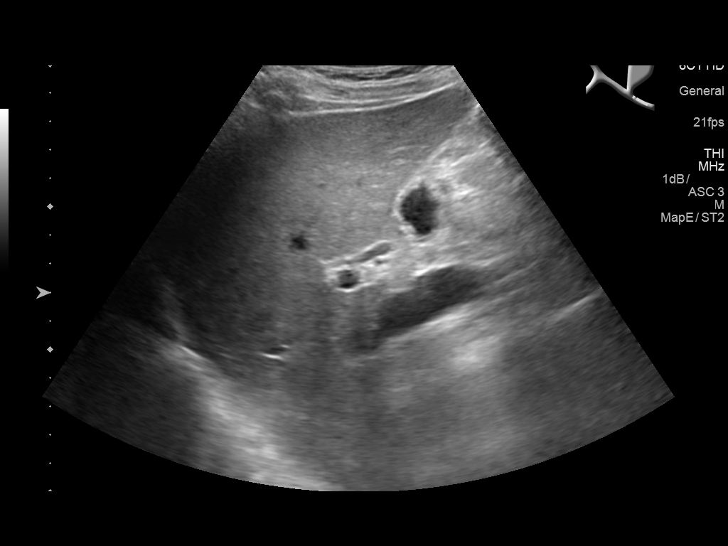
[im 4/42]
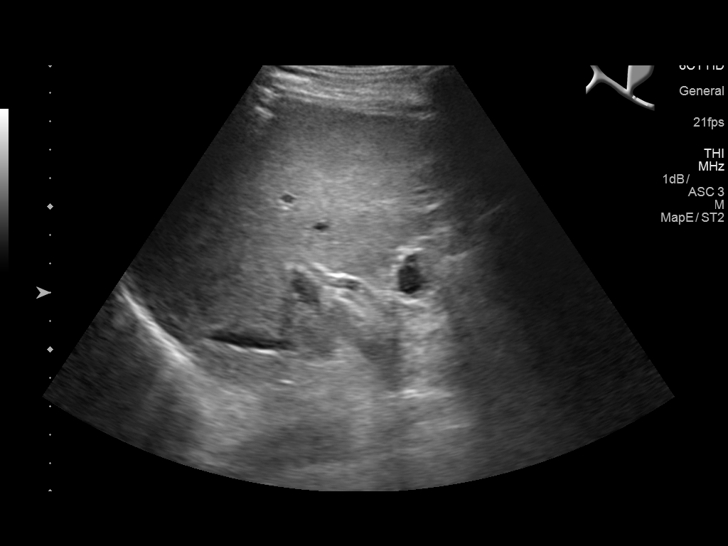
[im 7/42]
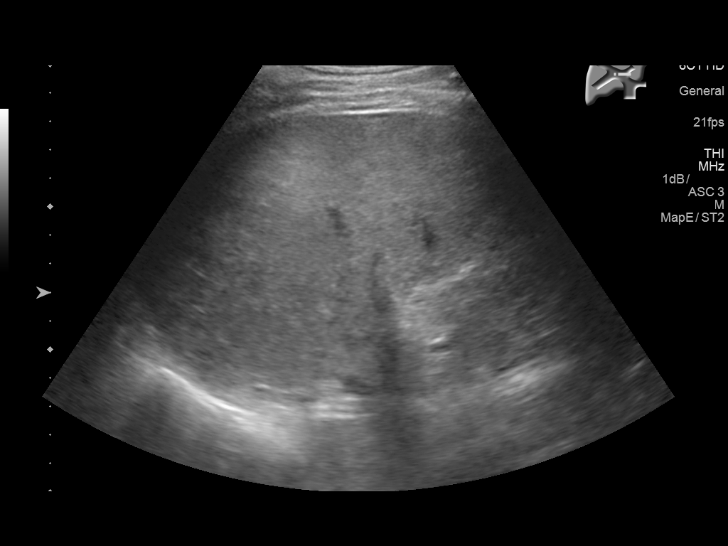
[im 11/42]
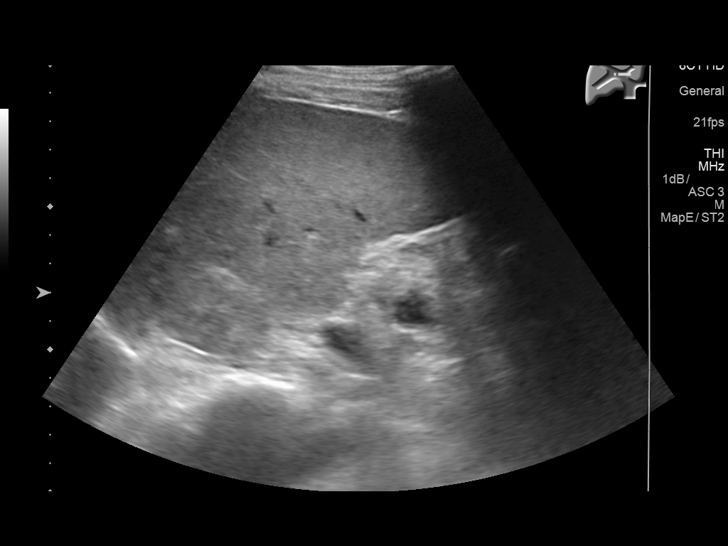
[im 14/42]
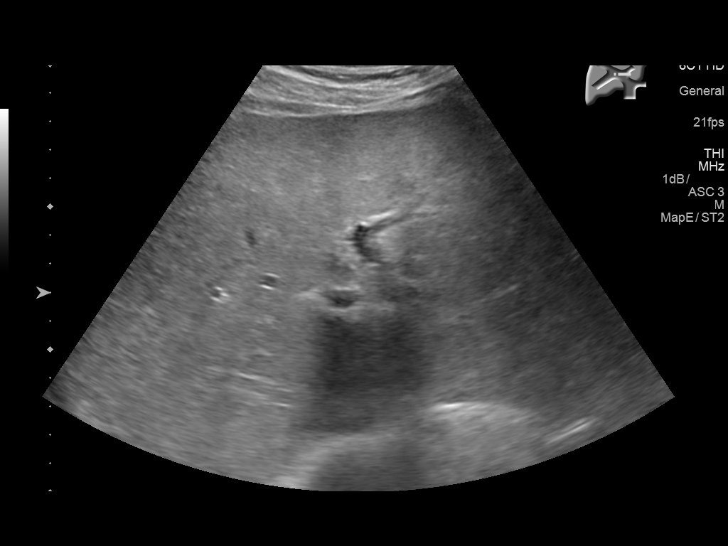
[im 16/42]
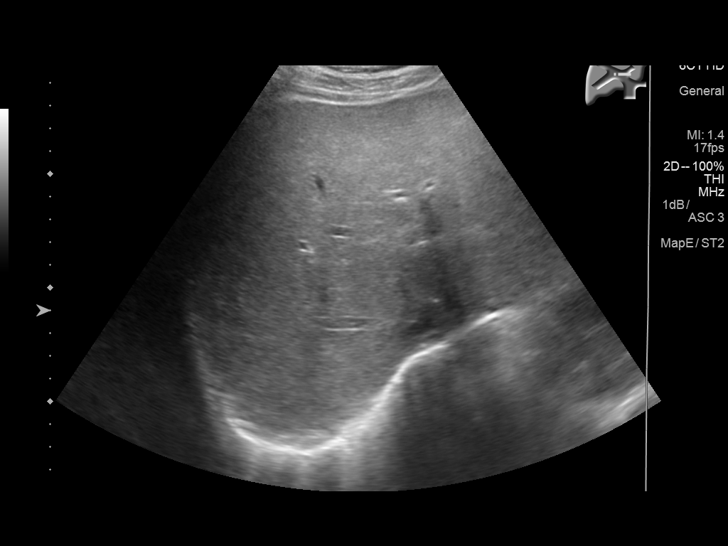
[im 19/42]
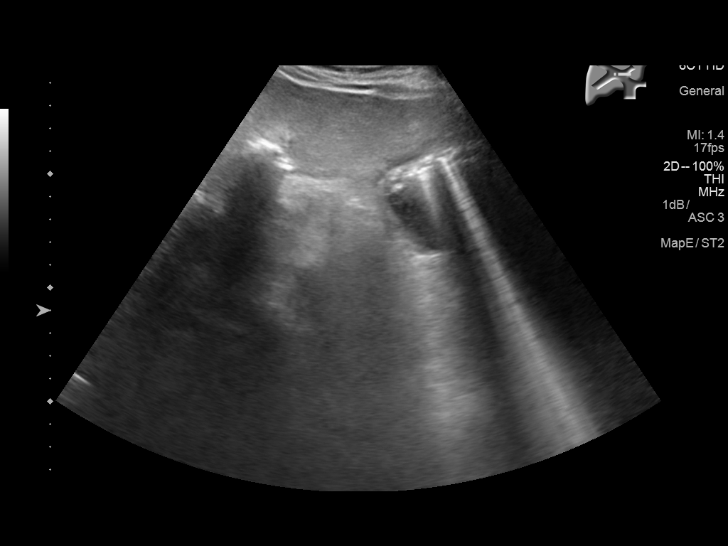
[im 23/42]
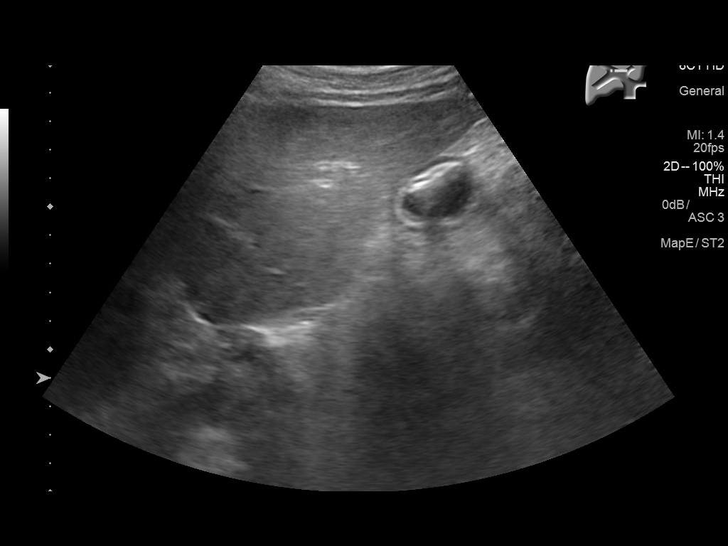
[im 26/42]
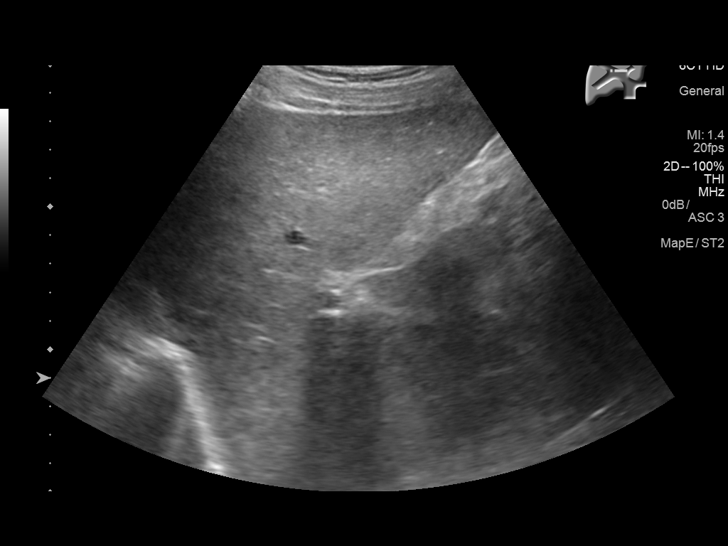
[im 28/42]
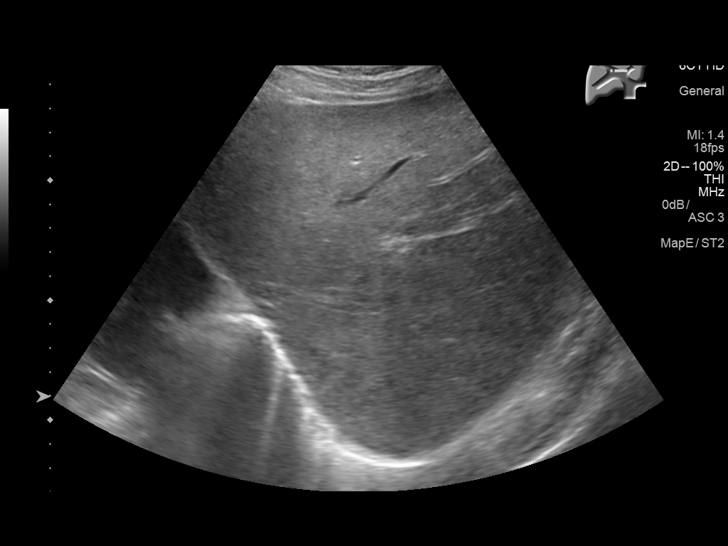
[im 31/42]
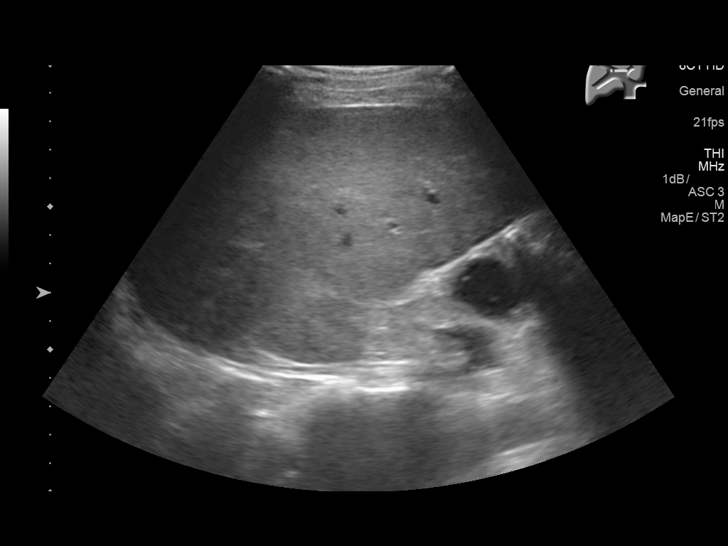
[im 35/42]
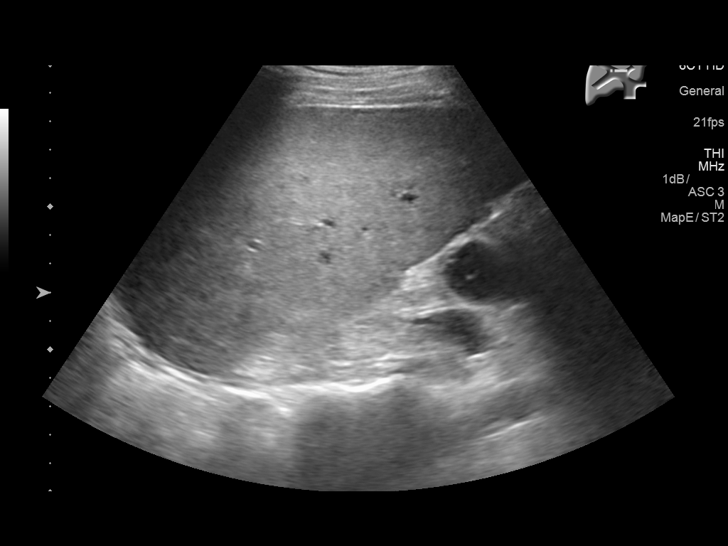
[im 38/42]
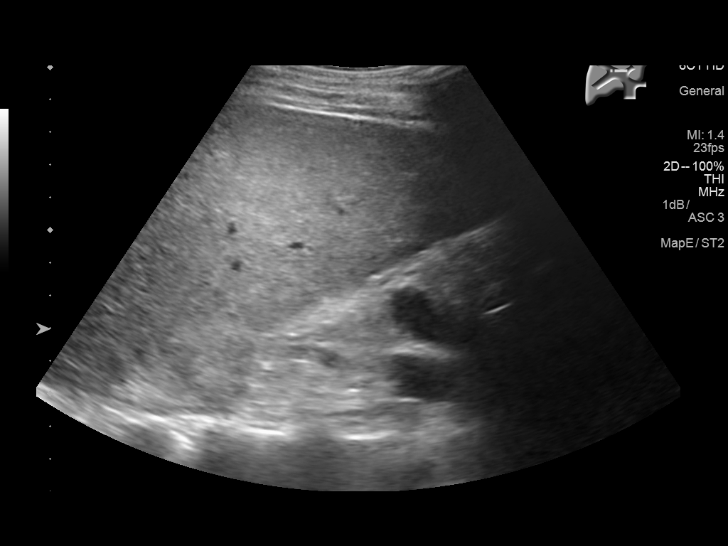
[im 42/42]
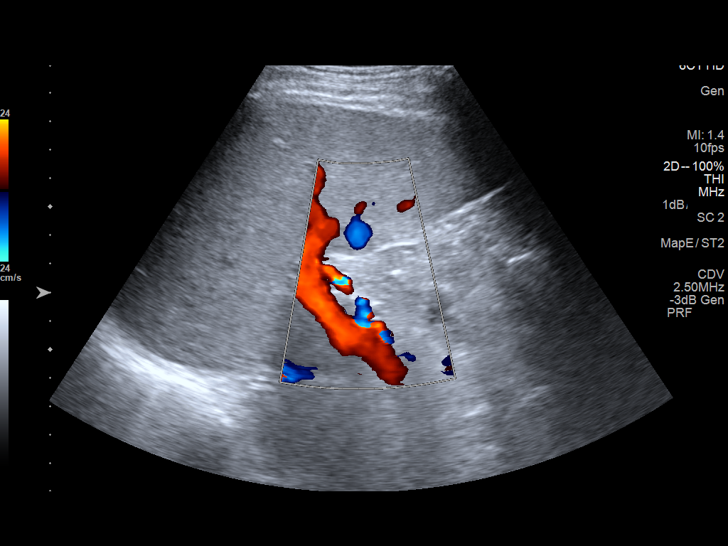

[14 of 25 positions shown; findings below may reference images not displayed]

FINDINGS: Gallbladder:

The gallbladder has previously been resected.

Common bile duct:

Diameter: The common bile duct is normal measuring 3.6 mm in
diameter.

Liver:

The liver parenchyma is somewhat echogenic and inhomogeneous
consistent with fatty infiltration. No focal hepatic abnormality is
seen. Portal vein is patent on color Doppler imaging with normal
direction of blood flow towards the liver.
IMPRESSION: 1. Inhomogeneous and echogenic liver parenchyma consistent with
diffuse fatty infiltration. No focal abnormality.
2. Prior cholecystectomy.

## 2019-08-14 ENCOUNTER — Telehealth: Payer: Self-pay | Admitting: Family Medicine

## 2019-08-14 ENCOUNTER — Other Ambulatory Visit: Payer: Self-pay | Admitting: Family Medicine

## 2019-08-14 LAB — OXYCODONE/OXYMORPHONE, CONFIRM
OXYCODONE/OXYMORPH: POSITIVE — AB
OXYCODONE: 762 ng/mL
OXYCODONE: POSITIVE — AB
OXYMORPHONE (GC/MS): 1768 ng/mL
OXYMORPHONE: POSITIVE — AB

## 2019-08-14 LAB — OPIATES CONFIRMATION, URINE
Codeine: NEGATIVE
Hydrocodone: NEGATIVE
Hydromorphone Confirm: >3000 ng/mL
Hydromorphone: POSITIVE — AB
Morphine: NEGATIVE
Opiates: POSITIVE ng/mL — AB

## 2019-08-14 LAB — CANNABINOID CONFIRMATION, UR
CANNABINOIDS: POSITIVE — AB
Carboxy THC GC/MS Conf: 246 ng/mL

## 2019-08-14 LAB — DRUG SCREEN 764883 11+OXYCO+ALC+CRT-BUND
Amphetamines, Urine: NEGATIVE ng/mL
Barbiturate: NEGATIVE ng/mL
Cocaine (Metabolite): NEGATIVE ng/mL
Creatinine: 182.2 mg/dL (ref 20.0–300.0)
Ethanol: NEGATIVE %
Meperidine: NEGATIVE ng/mL
Methadone Screen, Urine: NEGATIVE ng/mL
Phencyclidine: NEGATIVE ng/mL
Propoxyphene: NEGATIVE ng/mL
Tramadol: NEGATIVE ng/mL
pH, Urine: 5.5 (ref 4.5–8.9)

## 2019-08-14 LAB — BENZODIAZEPINES CONFIRM, URINE: Benzodiazepines: NEGATIVE ng/mL

## 2019-08-14 NOTE — Telephone Encounter (Signed)
[name] [name] is a 39 year old male with a medical history that is significant for sickle cell anemia. Patient has a critical hemoglobin of 6.1. Have made several calls to patient, unable to leave a message. Patient's hemoglobin is below his baseline of 7-8. Patient warrants blood transfusion. Also, patient missed last Aranesp injection.   Will continue to try to reach patient due to critical hemoglobin.    [name]  APRN, MSN, FNP-C Patient [facility] [address] [place], [place] [address] [phone]

## 2019-08-15 ENCOUNTER — Other Ambulatory Visit: Payer: Self-pay

## 2019-08-15 ENCOUNTER — Non-Acute Institutional Stay (HOSPITAL_COMMUNITY)
Admission: AD | Admit: 2019-08-15 | Discharge: 2019-08-15 | Disposition: A | Discharge status: Home / Self Care | Payer: Medicare Other | Source: Ambulatory Visit | Attending: Internal Medicine | Admitting: Internal Medicine

## 2019-08-15 ENCOUNTER — Ambulatory Visit (HOSPITAL_COMMUNITY)
Admission: RE | Admit: 2019-08-15 | Discharge: 2019-08-15 | Disposition: A | Discharge status: Home / Self Care | Payer: Medicare Other | Source: Ambulatory Visit | Attending: Internal Medicine | Admitting: Internal Medicine

## 2019-08-15 ENCOUNTER — Encounter (HOSPITAL_COMMUNITY): Payer: Self-pay

## 2019-08-15 DIAGNOSIS — K219 Gastro-esophageal reflux disease without esophagitis: Secondary | ICD-10-CM | POA: Insufficient documentation

## 2019-08-15 DIAGNOSIS — D571 Sickle-cell disease without crisis: Secondary | ICD-10-CM | POA: Insufficient documentation

## 2019-08-15 DIAGNOSIS — D638 Anemia in other chronic diseases classified elsewhere: Secondary | ICD-10-CM

## 2019-08-15 DIAGNOSIS — F17210 Nicotine dependence, cigarettes, uncomplicated: Secondary | ICD-10-CM | POA: Diagnosis not present

## 2019-08-15 DIAGNOSIS — Z79899 Other long term (current) drug therapy: Secondary | ICD-10-CM | POA: Insufficient documentation

## 2019-08-15 DIAGNOSIS — Z9049 Acquired absence of other specified parts of digestive tract: Secondary | ICD-10-CM | POA: Diagnosis not present

## 2019-08-15 DIAGNOSIS — D631 Anemia in chronic kidney disease: Secondary | ICD-10-CM | POA: Diagnosis not present

## 2019-08-15 DIAGNOSIS — F1120 Opioid dependence, uncomplicated: Secondary | ICD-10-CM | POA: Diagnosis not present

## 2019-08-15 DIAGNOSIS — Z885 Allergy status to narcotic agent status: Secondary | ICD-10-CM | POA: Diagnosis not present

## 2019-08-15 DIAGNOSIS — G894 Chronic pain syndrome: Secondary | ICD-10-CM | POA: Insufficient documentation

## 2019-08-15 DIAGNOSIS — Z886 Allergy status to analgesic agent status: Secondary | ICD-10-CM | POA: Insufficient documentation

## 2019-08-15 DIAGNOSIS — D649 Anemia, unspecified: Secondary | ICD-10-CM

## 2019-08-15 DIAGNOSIS — Z803 Family history of malignant neoplasm of breast: Secondary | ICD-10-CM | POA: Insufficient documentation

## 2019-08-15 DIAGNOSIS — N184 Chronic kidney disease, stage 4 (severe): Secondary | ICD-10-CM | POA: Diagnosis not present

## 2019-08-15 DIAGNOSIS — D5700 Hb-SS disease with crisis, unspecified: Secondary | ICD-10-CM | POA: Diagnosis not present

## 2019-08-15 DIAGNOSIS — N1832 Chronic kidney disease, stage 3b: Secondary | ICD-10-CM | POA: Insufficient documentation

## 2019-08-15 LAB — CBC WITH DIFFERENTIAL/PLATELET
Abs Immature Granulocytes: 0.09 10*3/uL — ABNORMAL HIGH (ref 0.00–0.07)
Basophils Absolute: 0.1 10*3/uL (ref 0.0–0.1)
Basophils Relative: 0 %
Eosinophils Absolute: 0.4 10*3/uL (ref 0.0–0.5)
Eosinophils Relative: 2 %
HCT: 21 % — ABNORMAL LOW (ref 39.0–52.0)
Hemoglobin: 6.5 g/dL — CL (ref 13.0–17.0)
Immature Granulocytes: 0 %
Lymphocytes Relative: 36 %
Lymphs Abs: 7.3 10*3/uL — ABNORMAL HIGH (ref 0.7–4.0)
MCH: 30.6 pg (ref 26.0–34.0)
MCHC: 32 g/dL (ref 30.0–36.0)
MCV: 95.7 fL (ref 80.0–100.0)
Monocytes Absolute: 1.1 10*3/uL — ABNORMAL HIGH (ref 0.1–1.0)
Monocytes Relative: 5 %
Neutro Abs: 11.4 10*3/uL — ABNORMAL HIGH (ref 1.7–7.7)
Neutrophils Relative %: 57 %
Platelets: 405 10*3/uL — ABNORMAL HIGH (ref 150–400)
RBC: 2.15 MIL/uL — ABNORMAL LOW (ref 4.22–5.81)
RDW: 21.5 % — ABNORMAL HIGH (ref 11.5–15.5)
WBC: 23 10*3/uL — ABNORMAL HIGH (ref 4.0–10.5)
nRBC: 0.1 % (ref 0.0–0.2)

## 2019-08-15 LAB — RETICULOCYTES
Immature Retic Fract: 36.6 % — ABNORMAL HIGH (ref 2.3–15.9)
RBC.: 2.15 MIL/uL — ABNORMAL LOW (ref 4.22–5.81)
Retic Count, Absolute: 184.5 10*3/uL (ref 19.0–186.0)
Retic Ct Pct: 8.6 % — ABNORMAL HIGH (ref 0.4–3.1)

## 2019-08-15 MED ORDER — OXYCODONE HCL 5 MG PO TABS
10.0000 mg | ORAL_TABLET | Freq: Once | ORAL | Status: AC
  Administered 2019-08-15: 10 mg via ORAL
  Filled 2019-08-15: qty 2

## 2019-08-15 MED ORDER — DARBEPOETIN ALFA 150 MCG/0.3ML IJ SOSY: 150.0000 ug | PREFILLED_SYRINGE | Freq: Once | INTRAMUSCULAR | Status: DC

## 2019-08-15 MED ORDER — HYDROMORPHONE HCL 1 MG/ML IJ SOLN
1.0000 mg | INTRAMUSCULAR | Status: AC
  Administered 2019-08-15 (×3): 1 mg via INTRAVENOUS
  Filled 2019-08-15 (×3): qty 1

## 2019-08-15 MED ORDER — DARBEPOETIN ALFA 150 MCG/0.3ML IJ SOSY
150.0000 ug | PREFILLED_SYRINGE | Freq: Once | INTRAMUSCULAR | Status: AC
  Administered 2019-08-15: 150 ug via SUBCUTANEOUS
  Filled 2019-08-15: qty 0.3

## 2019-08-15 MED ORDER — DARBEPOETIN ALFA 150 MCG/0.3ML IJ SOSY
150.0000 ug | PREFILLED_SYRINGE | Freq: Once | INTRAMUSCULAR | Status: DC
  Filled 2019-08-15: qty 0.3

## 2019-08-15 MED ORDER — SODIUM CHLORIDE 0.45 % IV SOLN: INTRAVENOUS | Status: DC

## 2019-08-15 MED ORDER — ACETAMINOPHEN 500 MG PO TABS
1000.0000 mg | ORAL_TABLET | Freq: Once | ORAL | Status: AC
  Administered 2019-08-15: 1000 mg via ORAL
  Filled 2019-08-15: qty 2

## 2019-08-15 NOTE — Progress Notes (Signed)
Patient came to the day hospital for Aranesp injection. Upon arrival, patient reported sickle cell pain in bilateral legs rated 10/10. Patient reported taking Oxycodone and Oxycontin 1 and 1/2 hours before arrival. COVID-19 screening done and patient denies all symptoms and exposures. Denies fever, chest pain, nausea, vomiting, diarrhea and abdominal pain. [place], [place] notified. Patient can come to the day hospital for pain management. Patient advised.

## 2019-08-15 NOTE — Discharge Instructions (Signed)
Sickle Cell Anemia, Adult  Sickle cell anemia is a condition where your red blood cells are shaped like sickles. Red blood cells carry oxygen through the body. Sickle-shaped cells do not live as long as normal red blood cells. They also clump together and block blood from flowing through the blood vessels. This prevents the body from getting enough oxygen. Sickle cell anemia causes organ damage and pain. It also increases the risk of infection. Follow these instructions at home: Medicines  Take over-the-counter and prescription medicines only as told by your doctor.  If you were prescribed an antibiotic medicine, take it as told by your doctor. Do not stop taking the antibiotic even if you start to feel better.  If you develop a fever, do not take medicines to lower the fever right away. Tell your doctor about the fever. Managing pain, stiffness, and swelling  Try these methods to help with pain: ? Use a heating pad. ? Take a warm bath. ? Distract yourself, such as by watching TV. Eating and drinking  Drink enough fluid to keep your pee (urine) clear or pale yellow. Drink more in hot weather and during exercise.  Limit or avoid alcohol.  Eat a healthy diet. Eat plenty of fruits, vegetables, whole grains, and lean protein.  Take vitamins and supplements as told by your doctor. Traveling  When traveling, keep these with you: ? Your medical information. ? The names of your doctors. ? Your medicines.  If you need to take an airplane, talk to your doctor first. Activity  Rest often.  Avoid exercises that make your heart beat much faster, such as jogging. General instructions  Do not use products that have nicotine or tobacco, such as cigarettes and e-cigarettes. If you need help quitting, ask your doctor.  Consider wearing a medical alert bracelet.  Avoid being in high places (high altitudes), such as mountains.  Avoid very hot or cold temperatures.  Avoid places where the  temperature changes a lot.  Keep all follow-up visits as told by your doctor. This is important. Contact a doctor if:  A joint hurts.  Your feet or hands hurt or swell.  You feel tired (fatigued). Get help right away if:  You have symptoms of infection. These include: ? Fever. ? Chills. ? Being very tired. ? Irritability. ? Poor eating. ? Throwing up (vomiting).  You feel dizzy or faint.  You have new stomach pain, especially on the left side.  You have a an erection (priapism) that lasts more than 4 hours.  You have numbness in your arms or legs.  You have a hard time moving your arms or legs.  You have trouble talking.  You have pain that does not go away when you take medicine.  You are short of breath.  You are breathing fast.  You have a long-term cough.  You have pain in your chest.  You have a bad headache.  You have a stiff neck.  Your stomach looks bloated even though you did not eat much.  Your skin is pale.  You suddenly cannot see well. Summary  Sickle cell anemia is a condition where your red blood cells are shaped like sickles.  Follow your doctor's advice on ways to manage pain, food to eat, activities to do, and steps to take for safe travel.  Get medical help right away if you have any signs of infection, such as a fever. This information is not intended to replace advice given to you by   your health care provider. Make sure you discuss any questions you have with your health care provider. Document Revised: 05/24/2018 Document Reviewed: 03/07/2016 Elsevier Patient Education  2020 [employer]  

## 2019-08-15 NOTE — H&P (Signed)
[facility] History and Physical   Date: 08/17/2019  Patient name: [name] [name]. Medical record number: [id number] Date of birth: 1980-07-08 Age: 39 y.o. Gender: male PCP: [name], MD  Attending physician: No att. providers found  Chief Complaint: Sickle cell pain  History of Present Illness: [name] [name] is a 39 year old male with a medical history significant for sickle cell disease, chronic kidney disease stage III, chronic pain syndrome, opiate dependence and tolerance, history of anemia of chronic disease, history of anemia of chronic disease, history of polysubstance abuse, and history of tobacco dependence presented with complaints of bilateral lower extremity pain.  On 08/12/2019, patient was found to have a hemoglobin of 6.1, which is below his baseline.  Patient was last transfused 1 unit of PRBCs on 08/04/2019 during hospital admission.  Patient scheduled to receive Aranesp injections every 2 weeks per hematology.  2 weeks prior, patient did not receive Aranesp injection being that his hemoglobin was greater than 8.0 g/dL.  Patient was scheduled to receive Aranesp injection on today, however he has complaints of bilateral lower extremity pain.  Pain intensity is 8/10 characterized as constant and throbbing.  Patient last had oxycodone and OxyContin this a.m. without sustained relief.  He denies headache, chest pain, urinary symptoms, nausea, vomiting, or diarrhea.  He has no sick contacts, recent travel, or exposure to COVID-19.   Meds: No medications prior to admission.    Allergies: Nsaids and Morphine and related Past Medical History:  Diagnosis Date  . Abscess of right iliac fossa 09/24/2018   required Perc Drain 09/24/2018  . Arachnoid Cyst of brain bilaterally    "2 really small ones in the back of my head; inside; saw them w/MRI" (09/25/2012)  . Bacterial pneumonia ~ 2012   "caught it here in the hospital" (09/25/2012)  . Chronic  kidney disease    "from my sickle cell" (09/25/2012)  . CKD (chronic kidney disease), stage II   . Colitis 04/19/2017   CT scan abd/ pelvis  . GERD (gastroesophageal reflux disease)    "after I eat alot of spicey foods" (09/25/2012)  . Gynecomastia, male 07/10/2012  . History of blood transfusion    "always related to sickle cell crisis" (09/25/2012)  . 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   Noted in noted from Hematology notes at [facility]  . Migraines    "take RX qd to prevent them" (09/25/2012)  . Opioid dependence with withdrawal ([facility]) 08/30/2012  . Renal insufficiency   . Sickle cell anemia (HCC)   . Sickle cell crisis ([facility]) 09/25/2012  . Sickle cell nephropathy ([facility]) 07/10/2012  . Sinus tachycardia   . Tachycardia with heart rate 121-140 beats per minute with ambulation 08/04/2016   Past Surgical History:  Procedure Laterality Date  . ABCESS DRAINAGE    . CHOLECYSTECTOMY  ~ 2012  . COLONOSCOPY N/A 04/23/2017   Procedure: COLONOSCOPY;  Surgeon: Irene Shipper, MD;  Location: [facility];  Service: Endoscopy;  Laterality: N/A;  . IR FLUORO GUIDE CV LINE RIGHT  12/17/2016  . IR RADIOLOGIST EVAL & MGMT  10/02/2018  . IR RADIOLOGIST EVAL & MGMT  10/15/2018  . IR REMOVAL TUN CV CATH W/O FL  12/21/2016  . IR US GUIDE VASC ACCESS RIGHT  12/17/2016  . spleenectomy     Family History  Problem Relation Age of Onset  . Breast cancer Mother    Social History   Socioeconomic History  . Marital status: Single  Spouse name: Not on file  . Number of children: Not on file  . Years of education: Not on file  . Highest education level: Not on file  Occupational History  . Not on file  Tobacco Use  . Smoking status: Current Some Day Smoker    Packs/day: 0.10    Types: Cigarettes  . Smokeless tobacco: Never Used  . Tobacco comment: 09/25/2012 "I don't buy cigarettes; bum one from friends q now and then"  Vaping Use  . Vaping Use: Never used  Substance and Sexual Activity  .  Alcohol use: Yes    Alcohol/week: 0.0 standard drinks    Comment: Once a month  . Drug use: Yes    Types: Marijuana    Comment: Every 2-3 weeks   . Sexual activity: Yes  Other Topics Concern  . Not on file  Social History Narrative    Lives alone in [place].   Back at school, studying Public relations account executive. Unemployed.    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    Social Determinants of Health   Financial Resource Strain:   . Difficulty of Paying Living Expenses:   Food Insecurity:   . Worried About Charity fundraiser in the Last Year:   . Arboriculturist in the Last Year:   Transportation Needs:   . Film/video editor (Medical):   Marland Kitchen Lack of Transportation (Non-Medical):   Physical Activity:   . Days of Exercise per Week:   . Minutes of Exercise per Session:   Stress:   . Feeling of Stress :   Social Connections:   . Frequency of Communication with Friends and Family:   . Frequency of Social Gatherings with Friends and Family:   . Attends Religious Services:   . Active Member of Clubs or Organizations:   . Attends Archivist Meetings:   Marland Kitchen Marital Status:   Intimate Partner Violence:   . Fear of Current or Ex-Partner:   . Emotionally Abused:   Marland Kitchen Physically Abused:   . Sexually Abused:    Review of Systems  Constitutional: Negative.   HENT: Negative.   Eyes: Negative.   Respiratory: Negative.   Cardiovascular: Negative.   Gastrointestinal: Negative.   Genitourinary: Negative.   Musculoskeletal: Positive for back pain and joint pain.  Skin: Negative.   Neurological: Negative.   Psychiatric/Behavioral: Negative.     Physical Exam: Blood pressure 123/81, pulse 72, temperature (!) 97.4 F (36.3 C), temperature source Oral, resp. rate 16, SpO2 98 %. Physical Exam Constitutional:      Appearance: Normal  appearance.  HENT:     Mouth/Throat:     Mouth: Mucous membranes are moist.     Pharynx: Oropharynx is clear.  Eyes:     Pupils: Pupils are equal, round, and reactive to light.  Cardiovascular:     Rate and Rhythm: Normal rate and regular rhythm.     Heart sounds: Murmur heard.   Pulmonary:     Effort: Pulmonary effort is normal.  Abdominal:     General: Abdomen is flat. Bowel sounds are normal.  Musculoskeletal:        General: Normal range of motion.  Skin:    General: Skin is warm.  Neurological:     General: No focal deficit present.     Mental Status: He is  alert and oriented to person, place, and time. Mental status is at baseline.  Psychiatric:        Mood and Affect: Mood normal.        Behavior: Behavior normal.        Thought Content: Thought content normal.        Judgment: Judgment normal.      Lab results: No results found for this or any previous visit (from the past 24 hour(s)).  Imaging results:  No results found.   Assessment & Plan: Patient admitted to sickle cell day infusion center for management of pain crisis He is opiate tolerant IV Dilaudid 1 mg x 3 doses Oxycodone 20 mg x 1 Tylenol 1000 mg x 1 IV fluids, 0.45% saline at 50 mL/h Repeat CBC with differential, complete metabolic panel, and reticulocytes.  Will review results as they become available. Pain will be reevaluated in context of function and relationship to baseline as care progresses. If pain intensity worsens or hemodynamic stability changes, consider inpatient admission for higher level of care.   Anemia of chronic disease: On 08/12/2019, patient's hemoglobin was 6.1 which is below his baseline.  Patient is scheduled to receive Aranesp every 2 weeks.  Patient did not receive 2 weeks prior due to hemoglobin being greater than 8.0 g/dL. Administer Aranesp 150 mcg x 1    [name]  APRN, MSN, [facility] Patient [facility] [address] [place], [place] [address] [phone]  08/17/2019, 2:07 PM

## 2019-08-15 NOTE — Progress Notes (Signed)
Patient admitted to the day hospital for treatment of sickle cell pain crisis. Patient reported pain rated 10/10 in the legs. Patient given IV Dilaudid, PO tylenol, PO Oxycodone and hydrated with IV fluids. Hemoglobin was 6.5. Sub Q Aranesp 150 mcg was given. At discharge, patient reported  pain at 5/10. Discharge instructions given to the patient. Patient alert, oriented and ambulatory at discharge.

## 2019-08-15 NOTE — Progress Notes (Signed)
CRITICAL VALUE ALERT  Critical Value:  Hemoglobin 6.5  Date & Time Notied:  08/15/2019, 09:22 am  Provider Notified: Cammie Sickle FNP  Orders Received/Actions taken: Patient to receive Aranesp

## 2019-08-17 NOTE — Discharge Summary (Signed)
[facility] Discharge Summary   Patient ID: [name] [name]. MRN: [id number] DOB/AGE: 1980-06-11 39 y.o.  Admit date: 08/15/2019 Discharge date: 08/17/2019  Primary Care Physician:  [name], MD  Admission Diagnoses:  Active Problems:   Sickle cell pain crisis [facility])   Discharge Medications:  Allergies as of 08/15/2019      Reactions   Nsaids Other (See Comments)   Kidney disease   Morphine And Related Other (See Comments)   "Real bad headaches"       Medication List    TAKE these medications   acetaminophen 500 MG tablet Commonly known as: TYLENOL Take 1 tablet (500 mg total) by mouth every 6 (six) hours as needed.   ALLOPURINOL PO Take 1 tablet by mouth daily.   folic acid 1 MG tablet Commonly known as: FOLVITE Take 1 tablet (1 mg total) by mouth daily.   gabapentin 300 MG capsule Commonly known as: NEURONTIN Take 1 capsule (300 mg total) by mouth 2 (two) times daily as needed.   nortriptyline 25 MG capsule Commonly known as: PAMELOR Take 1 capsule (25 mg total) by mouth 2 (two) times daily.   Oxycodone HCl 10 MG Tabs Take 1 tablet (10 mg total) by mouth every 6 (six) hours as needed.   oxyCODONE 15 mg 12 hr tablet Commonly known as: OxyCONTIN Take 1 tablet (15 mg total) by mouth every 12 (twelve) hours.   sodium bicarbonate 650 MG tablet Take 650 mg by mouth 2 (two) times daily.   Vitamin D3 50 MCG (2000 UT) Tabs Take 2 tablets by mouth daily.        Consults:  None  Significant Diagnostic Studies:  No results found.  History of present illness:  [name] [name] is a 39 year old male with a medical history significant for sickle cell disease, chronic kidney disease stage III, chronic pain syndrome, opiate dependence and tolerance, history of anemia of chronic disease, history of anemia of chronic disease, history of polysubstance abuse, and history of tobacco dependence presented with complaints of bilateral lower  extremity pain.  On 08/12/2019, patient was found to have a hemoglobin of 6.1, which is below his baseline.  Patient was last transfused 1 unit of PRBCs on 08/04/2019 during hospital admission.  Patient scheduled to receive Aranesp injections every 2 weeks per hematology.  2 weeks prior, patient did not receive Aranesp injection being that his hemoglobin was greater than 8.0 g/dL.  Patient was scheduled to receive Aranesp injection on today, however he has complaints of bilateral lower extremity pain.  Pain intensity is 8/10 characterized as constant and throbbing.  Patient last had oxycodone and OxyContin this a.m. without sustained relief.  He denies headache, chest pain, urinary symptoms, nausea, vomiting, or diarrhea.  He has no sick contacts, recent travel, or exposure to COVID-19. [facility] Course: Sickle cell disease with pain crisis: Patient was admitted to sickle cell day infusion center for management of pain crisis.  All laboratory values reviewed, patient found to have hemoglobin of 6.5. Pain managed with Dilaudid 1 mg IV x3 doses Hold IV Toradol due to history of chronic kidney disease stage III IV fluids, 0.45% saline at 50 mL/h Tylenol 1000 mg by mouth x1 Oxycodone 20 mg x 1 Pain intensity decreased to 5/10.  Patient does not warrant admission at this time.  Advised to resume home meds.  Patient will follow-up in 2 weeks.  Anemia of chronic disease: Patient was found to have  a hemoglobin of 6.1 on 08/12/2019.  Hemoglobin was below patient's baseline.  Patient scheduled to receive Aranesp injections every 2 weeks.  Received Aranesp 150 mcg x 1 without complication.  Patient will follow-up in 2 weeks to repeat Aranesp injection if hemoglobin is less than 8.0 g/dL.  Patient advised to follow-up with hematology as scheduled.  Discharge instructions: Resume all home medications.   Follow up with PCP as previously  scheduled.   Discussed the importance of drinking 64 ounces  of water daily, dehydration of red blood cells may lead further sickling.   Avoid all stressors that precipitate sickle cell pain crisis.     The patient was given clear instructions to go to ER or return to medical center if symptoms do not improve, worsen or new problems develop.    Physical Exam at Discharge:  BP 123/81 (BP Location: Right Arm)   Pulse 72   Temp (!) 97.4 F (36.3 C) (Oral)   Resp 16   SpO2 98%   Physical Exam Constitutional:      Appearance: Normal appearance.  HENT:     Head: Normocephalic.  Eyes:     Pupils: Pupils are equal, round, and reactive to light.  Cardiovascular:     Rate and Rhythm: Normal rate.     Pulses: Normal pulses.     Heart sounds: Murmur heard.   Pulmonary:     Effort: Pulmonary effort is normal.  Abdominal:     General: Abdomen is flat. Bowel sounds are normal.  Skin:    General: Skin is warm.  Neurological:     General: No focal deficit present.     Mental Status: He is alert and oriented to person, place, and time. Mental status is at baseline.  Psychiatric:        Mood and Affect: Mood normal.        Behavior: Behavior normal.        Thought Content: Thought content normal.        Judgment: Judgment normal.       Disposition at Discharge: Discharge disposition: 01-Home or Self Care       Discharge Orders: Discharge Instructions    Discharge patient   Complete by: As directed    Discharge disposition: 01-Home or Self Care   Discharge patient date: 08/15/2019      Condition at Discharge:   Stable  Time spent on Discharge:  Greater than 30 minutes.  Signed: [name]  APRN, MSN, FNP-C Patient [facility] [address] [place], [place] [address] [phone]  08/17/2019, 1:35 PM

## 2019-08-24 NOTE — Progress Notes (Signed)
Patient [name]   Established Patient Office Visit  Subjective:  Patient ID: [name] [name]., male    DOB: 1980-09-06  Age: 39 y.o. MRN: [id number]  CC:  Chief Complaint  Patient presents with  . Follow-up    pt was in the hospital over the weekend for crisis    HPI [name] [name]. is a 39 year old male with a medical history significant for sickle cell disease type SS, history of anemia of chronic disease, chronic kidney disease stage III, chronic pain syndrome, opiate dependence and tolerance, and history of polysubstance abuse presents for follow-up of chronic conditions and medication management.  Also, patient was hospitalized recently for sickle cell pain crisis.  [name] [name] was admitted to inpatient services on 08/02/2019 for management of sickle cell pain crisis.  Patient states that the onset of pain was in his lower legs, back, and arms.  Pain was consistent with his previous sickle cell disease crisis.  Patient was taking home medication regimen consistently without relief.  Patient has a history of chronic kidney disease and is unable to take NSAIDs.  Patient's pain crisis resolved on IV pain medications, and IV fluids. During admission, patient also had abscess to left cheek.  Simple I&D was performed at bedside.  Moderate drainage, purulent.  Patient was discharged home on doxycycline and has been taking antibiotic consistently since discharge. Today, patient states the pain intensity is 4/10.  He last had oxycodone and OxyContin this a.m. with moderate relief.  He denies any headache, chest pain, shortness of breath, urinary symptoms, nausea, vomiting, or diarrhea.  Past Medical History:  Diagnosis Date  . Abscess of right iliac fossa 09/24/2018   required Perc Drain 09/24/2018  . Arachnoid Cyst of brain bilaterally    "2 really small ones in the back of my head; inside; saw them w/MRI" (09/25/2012)  . Bacterial  pneumonia ~ 2012   "caught it here in the hospital" (09/25/2012)  . Chronic kidney disease    "from my sickle cell" (09/25/2012)  . CKD (chronic kidney disease), stage II   . Colitis 04/19/2017   CT scan abd/ pelvis  . GERD (gastroesophageal reflux disease)    "after I eat alot of spicey foods" (09/25/2012)  . Gynecomastia, male 07/10/2012  . History of blood transfusion    "always related to sickle cell crisis" (09/25/2012)  . 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   Noted in noted from Hematology [facility] at [facility]  . Migraines    "take RX qd to prevent them" (09/25/2012)  . Opioid dependence with withdrawal ([place]) 08/30/2012  . Renal insufficiency   . Sickle cell anemia (HCC)   . Sickle cell crisis ([place]) 09/25/2012  . Sickle cell nephropathy ([place]) 07/10/2012  . Sinus tachycardia   . Tachycardia with heart rate 121-140 beats per minute with ambulation 08/04/2016    Past Surgical History:  Procedure Laterality Date  . ABCESS DRAINAGE    . CHOLECYSTECTOMY  ~ 2012  . COLONOSCOPY N/A 04/23/2017   Procedure: COLONOSCOPY;  Surgeon: Irene Shipper, MD;  Location: [facility];  Service: Endoscopy;  Laterality: N/A;  . IR FLUORO GUIDE CV LINE RIGHT  12/17/2016  . IR RADIOLOGIST EVAL & MGMT  10/02/2018  . IR RADIOLOGIST EVAL & MGMT  10/15/2018  . IR REMOVAL TUN CV CATH W/O FL  12/21/2016  . IR US GUIDE VASC ACCESS RIGHT  12/17/2016  . spleenectomy      Family History  Problem Relation Age of Onset  . Breast cancer Mother     Social History   Socioeconomic History  . Marital status: Single    Spouse name: Not on file  . Number of children: Not on file  . Years of education: Not on file  . Highest education level: Not on file  Occupational History  . Not on file  Tobacco Use  . Smoking status: Current Some Day Smoker    Packs/day: 0.10    Types: Cigarettes  . Smokeless tobacco: Never Used  . Tobacco comment: 09/25/2012 "I don't buy cigarettes; bum one from friends q now and  then"  Vaping Use  . Vaping Use: Never used  Substance and Sexual Activity  . Alcohol use: Yes    Alcohol/week: 0.0 standard drinks    Comment: Once a month  . Drug use: Yes    Types: Marijuana    Comment: Every 2-3 weeks   . Sexual activity: Yes  Other Topics Concern  . Not on file  Social History Narrative    Lives alone in [place].   Back at school, studying Public relations account executive. Unemployed.    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    Social Determinants of Health   Financial Resource Strain:   . Difficulty of Paying Living Expenses:   Food Insecurity:   . Worried About Charity fundraiser in the Last Year:   . Arboriculturist in the Last Year:   Transportation Needs:   . Film/video editor (Medical):   Marland Kitchen Lack of Transportation (Non-Medical):   Physical Activity:   . Days of Exercise per Week:   . Minutes of Exercise per Session:   Stress:   . Feeling of Stress :   Social Connections:   . Frequency of Communication with Friends and Family:   . Frequency of Social Gatherings with Friends and Family:   . Attends Religious Services:   . Active Member of Clubs or Organizations:   . Attends Archivist Meetings:   Marland Kitchen Marital Status:   Intimate Partner Violence:   . Fear of Current or Ex-Partner:   . Emotionally Abused:   Marland Kitchen Physically Abused:   . Sexually Abused:     Outpatient Medications Prior to Visit  Medication Sig Dispense Refill  . acetaminophen (TYLENOL) 500 MG tablet Take 1 tablet (500 mg total) by mouth every 6 (six) hours as needed. 30 tablet 0  . ALLOPURINOL PO Take 1 tablet by mouth daily.    . Cholecalciferol (VITAMIN D3) 50 MCG (2000 UT) TABS Take 2 tablets by mouth daily.     Marland Kitchen doxycycline (VIBRA-TABS) 100 MG tablet Take 1 tablet (100 mg total) by mouth 2 (two) times daily for 7 days. 14 tablet  0  . folic acid (FOLVITE) 1 MG tablet Take 1 tablet (1 mg total) by mouth daily. 30 tablet 12  . nortriptyline (PAMELOR) 25 MG capsule Take 1 capsule (25 mg total) by mouth 2 (two) times daily. 90 capsule 3  . sodium bicarbonate 650 MG tablet Take 650 mg by mouth 2 (two) times daily.    . Oxycodone HCl 10 MG TABS Take 1 tablet (10 mg total) by mouth every 6 (six) hours as needed. 60 tablet 0  . gabapentin (NEURONTIN) 300 MG capsule Take 1 capsule (300 mg total) by  mouth 2 (two) times daily as needed. (Patient not taking: Reported on 06/12/2019) 60 capsule 1  . oxyCODONE (OXYCONTIN) 15 mg 12 hr tablet Take 1 tablet (15 mg total) by mouth every 12 (twelve) hours. 60 tablet 0   No facility-administered medications prior to visit.    Allergies  Allergen Reactions  . Nsaids Other (See Comments)    Kidney disease  . Morphine And Related Other (See Comments)    "Real bad headaches"     ROS Review of Systems  Constitutional: Negative for fatigue and fever.  HENT: Negative.   Eyes: Negative.   Respiratory: Negative.   Cardiovascular: Negative.   Gastrointestinal: Negative.   Endocrine: Negative for polydipsia, polyphagia and polyuria.  Genitourinary: Negative.   Musculoskeletal: Positive for arthralgias and back pain.  Skin:       Tenderness to left cheek, no drainage  Allergic/Immunologic: Negative.   Neurological: Negative.   Hematological: Negative.   Psychiatric/Behavioral: Negative.       Objective:    Physical Exam Constitutional:      Appearance: Normal appearance.  Eyes:     Pupils: Pupils are equal, round, and reactive to light.  Cardiovascular:     Rate and Rhythm: Normal rate and regular rhythm.     Heart sounds: Murmur heard.   Pulmonary:     Effort: Pulmonary effort is normal.  Abdominal:     General: Bowel sounds are normal.  Skin:         Comments: Left cheek slightly tender to touch, no erythema or edema.  No drainage.  Neurological:     General: No  focal deficit present.     Mental Status: He is alert. Mental status is at baseline.  Psychiatric:        Mood and Affect: Mood normal.        Behavior: Behavior normal.        Thought Content: Thought content normal.        Judgment: Judgment normal.     BP 98/67 (BP Location: Left Arm, Patient Position: Sitting)   Pulse (!) 113   Temp 98.8 F (37.1 C) (Temporal)   Ht 5\' 3"  (1.6 m)   Wt 115 lb 3.2 oz (52.3 kg)   SpO2 100%   BMI 20.41 kg/m  Wt Readings from Last 3 Encounters:  08/05/19 115 lb 3.2 oz (52.3 kg)  08/04/19 116 lb 13.5 oz (53 kg)  06/28/19 108 lb 0.4 oz (49 kg)     Health Maintenance Due  Topic Date Due  . COVID-19 Vaccine (2 - Moderna 2-dose series) 06/26/2019    There are no preventive care reminders to display for this patient.  Lab Results  Component Value Date   TSH 2.860 05/11/2017   Lab Results  Component Value Date   WBC 23.0 (H) 08/15/2019   HGB 6.5 (LL) 08/15/2019   HCT 21.0 (L) 08/15/2019   MCV 95.7 08/15/2019   PLT 405 (H) 08/15/2019   Lab Results  Component Value Date   NA 137 08/04/2019   K 4.8 08/04/2019   CO2 15 (L) 08/04/2019   GLUCOSE 88 08/04/2019   BUN 27 (H) 08/04/2019   CREATININE 2.32 (H) 08/04/2019   BILITOT 1.9 (H) 08/03/2019   ALKPHOS 219 (H) 08/03/2019   AST 38 08/03/2019   ALT 23 08/03/2019   PROT 6.7 08/03/2019   ALBUMIN 3.4 (L) 08/03/2019   CALCIUM 9.2 08/04/2019   ANIONGAP 11 08/04/2019   Lab Results  Component Value Date  CHOL 145 03/07/2018   Lab Results  Component Value Date   HDL 65 03/07/2018   Lab Results  Component Value Date   LDLCALC 30 03/07/2018   Lab Results  Component Value Date   TRIG 251 (H) 03/07/2018   Lab Results  Component Value Date   CHOLHDL 2.2 03/07/2018   Lab Results  Component Value Date   HGBA1C <4.2 (L) 03/07/2018      Assessment & Plan:   Problem List Items Addressed This Visit      Other   Sickle-cell disease with pain ([place])   Relevant Orders   CBC  (Completed)   Urinalysis Dipstick   Hb-SS disease without crisis ([place])   Relevant Medications   oxyCODONE (OXYCONTIN) 15 mg 12 hr tablet   Oxycodone HCl 10 MG TABS   Chronic pain syndrome - Primary   Relevant Medications   oxyCODONE (OXYCONTIN) 15 mg 12 hr tablet   Oxycodone HCl 10 MG TABS   Other Relevant Orders   [id number] 11+Oxyco+Alc+Crt-Bund (Completed)      Meds ordered this encounter  Medications  . oxyCODONE (OXYCONTIN) 15 mg 12 hr tablet    Sig: Take 1 tablet (15 mg total) by mouth every 12 (twelve) hours.    Dispense:  60 tablet    Refill:  0    Order Specific Question:   Supervising Provider    Answer:   [name] [id number]  . Oxycodone HCl 10 MG TABS    Sig: Take 1 tablet (10 mg total) by mouth every 6 (six) hours as needed.    Dispense:  60 tablet    Refill:  0    Order Specific Question:   Supervising Provider    Answer:   [name] [[id number]]   Chronic pain syndrome Reviewed PDMP substance reporting system prior to prescribing opiate medications. No inconsistencies noted.   - [id number] 11+Oxyco+Alc+Crt-Bund - oxyCODONE (OXYCONTIN) 15 mg 12 hr tablet; Take 1 tablet (15 mg total) by mouth every 12 (twelve) hours.  Dispense: 60 tablet; Refill: 0 - Benzodiazepines Confirm, Urine - Cannabinoid Conf, Ur - Opiates Confirmation, Urine - Oxycodone/Oxymorphone, Confirm  Sickle-cell disease with pain (HCC) - CBC - Urinalysis Dipstick  Hb-SS disease without crisis [facility]) Reminded patient to follow-up with hematology as scheduled.  Patient will return to clinic in 1 week for CBC.  If hemoglobin is less than 8.0 g/dL, continue Aranesp injections. - oxyCODONE (OXYCONTIN) 15 mg 12 hr tablet; Take 1 tablet (15 mg total) by mouth every 12 (twelve) hours.  Dispense: 60 tablet; Refill: 0 - Oxycodone HCl 10 MG TABS; Take 1 tablet (10 mg total) by mouth every 6 (six) hours as needed.  Dispense: 60 tablet; Refill: 0   Abscess of left external cheek Left cheek  abscess markedly improved.  Continue doxycycline.  Discussed the importance of completing oral antibiotics.  Apply warm moist compresses as needed.  Keep area clean and dry.    Follow-up: Return in about 2 months (around 10/05/2019).    [name]  APRN, MSN, FNP-C [facility] [address] [place], [place] [address] [phone]

## 2019-08-25 ENCOUNTER — Other Ambulatory Visit: Payer: Self-pay

## 2019-08-25 ENCOUNTER — Encounter (HOSPITAL_COMMUNITY): Payer: Self-pay

## 2019-08-25 ENCOUNTER — Emergency Department (HOSPITAL_COMMUNITY): Payer: Medicare Other

## 2019-08-25 ENCOUNTER — Inpatient Hospital Stay (HOSPITAL_COMMUNITY)
Admission: EM | Admit: 2019-08-25 | Discharge: 2019-08-30 | DRG: 812 | Disposition: A | Discharge status: Home / Self Care | Payer: Medicare Other | Attending: Internal Medicine | Admitting: Internal Medicine

## 2019-08-25 DIAGNOSIS — Z79899 Other long term (current) drug therapy: Secondary | ICD-10-CM

## 2019-08-25 DIAGNOSIS — Z803 Family history of malignant neoplasm of breast: Secondary | ICD-10-CM

## 2019-08-25 DIAGNOSIS — N184 Chronic kidney disease, stage 4 (severe): Secondary | ICD-10-CM | POA: Diagnosis present

## 2019-08-25 DIAGNOSIS — N179 Acute kidney failure, unspecified: Secondary | ICD-10-CM | POA: Diagnosis present

## 2019-08-25 DIAGNOSIS — D5700 Hb-SS disease with crisis, unspecified: Secondary | ICD-10-CM | POA: Diagnosis not present

## 2019-08-25 DIAGNOSIS — Z886 Allergy status to analgesic agent status: Secondary | ICD-10-CM

## 2019-08-25 DIAGNOSIS — E559 Vitamin D deficiency, unspecified: Secondary | ICD-10-CM | POA: Diagnosis present

## 2019-08-25 DIAGNOSIS — Z885 Allergy status to narcotic agent status: Secondary | ICD-10-CM | POA: Diagnosis not present

## 2019-08-25 DIAGNOSIS — K219 Gastro-esophageal reflux disease without esophagitis: Secondary | ICD-10-CM | POA: Diagnosis present

## 2019-08-25 DIAGNOSIS — N289 Disorder of kidney and ureter, unspecified: Secondary | ICD-10-CM

## 2019-08-25 DIAGNOSIS — G43909 Migraine, unspecified, not intractable, without status migrainosus: Secondary | ICD-10-CM | POA: Diagnosis present

## 2019-08-25 DIAGNOSIS — E875 Hyperkalemia: Secondary | ICD-10-CM

## 2019-08-25 DIAGNOSIS — D57219 Sickle-cell/Hb-C disease with crisis, unspecified: Secondary | ICD-10-CM | POA: Diagnosis not present

## 2019-08-25 DIAGNOSIS — Z79891 Long term (current) use of opiate analgesic: Secondary | ICD-10-CM

## 2019-08-25 DIAGNOSIS — Z20822 Contact with and (suspected) exposure to covid-19: Secondary | ICD-10-CM | POA: Diagnosis present

## 2019-08-25 DIAGNOSIS — G894 Chronic pain syndrome: Secondary | ICD-10-CM | POA: Diagnosis present

## 2019-08-25 DIAGNOSIS — R011 Cardiac murmur, unspecified: Secondary | ICD-10-CM | POA: Diagnosis present

## 2019-08-25 DIAGNOSIS — F17210 Nicotine dependence, cigarettes, uncomplicated: Secondary | ICD-10-CM | POA: Diagnosis present

## 2019-08-25 DIAGNOSIS — D72829 Elevated white blood cell count, unspecified: Secondary | ICD-10-CM | POA: Diagnosis present

## 2019-08-25 DIAGNOSIS — N189 Chronic kidney disease, unspecified: Secondary | ICD-10-CM | POA: Diagnosis present

## 2019-08-25 LAB — CBC WITH DIFFERENTIAL/PLATELET
Abs Immature Granulocytes: 0.12 10*3/uL — ABNORMAL HIGH (ref 0.00–0.07)
Basophils Absolute: 0 10*3/uL (ref 0.0–0.1)
Basophils Relative: 0 %
Eosinophils Absolute: 0.2 10*3/uL (ref 0.0–0.5)
Eosinophils Relative: 1 %
HCT: 24.5 % — ABNORMAL LOW (ref 39.0–52.0)
Hemoglobin: 8.1 g/dL — ABNORMAL LOW (ref 13.0–17.0)
Immature Granulocytes: 1 %
Lymphocytes Relative: 25 %
Lymphs Abs: 4.9 10*3/uL — ABNORMAL HIGH (ref 0.7–4.0)
MCH: 33.5 pg (ref 26.0–34.0)
MCHC: 33.1 g/dL (ref 30.0–36.0)
MCV: 101.2 fL — ABNORMAL HIGH (ref 80.0–100.0)
Monocytes Absolute: 1.4 10*3/uL — ABNORMAL HIGH (ref 0.1–1.0)
Monocytes Relative: 7 %
Neutro Abs: 12.8 10*3/uL — ABNORMAL HIGH (ref 1.7–7.7)
Neutrophils Relative %: 66 %
Platelets: 296 10*3/uL (ref 150–400)
RBC: 2.42 MIL/uL — ABNORMAL LOW (ref 4.22–5.81)
RDW: 23.6 % — ABNORMAL HIGH (ref 11.5–15.5)
WBC: 19.5 10*3/uL — ABNORMAL HIGH (ref 4.0–10.5)
nRBC: 0.6 % — ABNORMAL HIGH (ref 0.0–0.2)

## 2019-08-25 LAB — COMPREHENSIVE METABOLIC PANEL
ALT: 20 U/L (ref 0–44)
AST: 40 U/L (ref 15–41)
Albumin: 3.4 g/dL — ABNORMAL LOW (ref 3.5–5.0)
Alkaline Phosphatase: 225 U/L — ABNORMAL HIGH (ref 38–126)
Anion gap: 8 (ref 5–15)
BUN: 44 mg/dL — ABNORMAL HIGH (ref 6–20)
CO2: 14 mmol/L — ABNORMAL LOW (ref 22–32)
Calcium: 8.8 mg/dL — ABNORMAL LOW (ref 8.9–10.3)
Chloride: 114 mmol/L — ABNORMAL HIGH (ref 98–111)
Creatinine, Ser: 3.19 mg/dL — ABNORMAL HIGH (ref 0.61–1.24)
GFR calc Af Amer: 27 mL/min — ABNORMAL LOW (ref >60)
GFR calc non Af Amer: 24 mL/min — ABNORMAL LOW (ref >60)
Glucose, Bld: 90 mg/dL (ref 70–99)
Potassium: 7.1 mmol/L (ref 3.5–5.1)
Sodium: 136 mmol/L (ref 135–145)
Total Bilirubin: 1.6 mg/dL — ABNORMAL HIGH (ref 0.3–1.2)
Total Protein: 7.6 g/dL (ref 6.5–8.1)

## 2019-08-25 LAB — BASIC METABOLIC PANEL
Anion gap: 11 (ref 5–15)
BUN: 41 mg/dL — ABNORMAL HIGH (ref 6–20)
CO2: 18 mmol/L — ABNORMAL LOW (ref 22–32)
Calcium: 8.5 mg/dL — ABNORMAL LOW (ref 8.9–10.3)
Chloride: 108 mmol/L (ref 98–111)
Creatinine, Ser: 3.04 mg/dL — ABNORMAL HIGH (ref 0.61–1.24)
GFR calc Af Amer: 29 mL/min — ABNORMAL LOW (ref >60)
GFR calc non Af Amer: 25 mL/min — ABNORMAL LOW (ref >60)
Glucose, Bld: 154 mg/dL — ABNORMAL HIGH (ref 70–99)
Potassium: 5.3 mmol/L — ABNORMAL HIGH (ref 3.5–5.1)
Sodium: 137 mmol/L (ref 135–145)

## 2019-08-25 LAB — RAPID URINE DRUG SCREEN, HOSP PERFORMED
Amphetamines: NOT DETECTED
Barbiturates: NOT DETECTED
Benzodiazepines: NOT DETECTED
Cocaine: NOT DETECTED
Opiates: POSITIVE — AB
Tetrahydrocannabinol: POSITIVE — AB

## 2019-08-25 LAB — RETICULOCYTES
Immature Retic Fract: 15.9 % (ref 2.3–15.9)
RBC.: 2.43 MIL/uL — ABNORMAL LOW (ref 4.22–5.81)
Retic Count, Absolute: 399.5 10*3/uL — ABNORMAL HIGH (ref 19.0–186.0)
Retic Ct Pct: 16.3 % — ABNORMAL HIGH (ref 0.4–3.1)

## 2019-08-25 LAB — GLUCOSE, CAPILLARY: Glucose-Capillary: 79 mg/dL (ref 70–99)

## 2019-08-25 LAB — CBG MONITORING, ED: Glucose-Capillary: 93 mg/dL (ref 70–99)

## 2019-08-25 LAB — SARS CORONAVIRUS 2 BY RT PCR (HOSPITAL ORDER, PERFORMED IN ~~LOC~~ HOSPITAL LAB): SARS Coronavirus 2: NEGATIVE

## 2019-08-25 MED ORDER — INSULIN ASPART 100 UNIT/ML IV SOLN
5.0000 [IU] | Freq: Once | INTRAVENOUS | Status: AC
  Administered 2019-08-25: 5 [IU] via INTRAVENOUS
  Filled 2019-08-25: qty 0.05

## 2019-08-25 MED ORDER — HYDROMORPHONE HCL 2 MG/ML IJ SOLN
2.0000 mg | INTRAMUSCULAR | Status: AC
  Administered 2019-08-25: 2 mg via INTRAVENOUS
  Filled 2019-08-25: qty 1

## 2019-08-25 MED ORDER — SODIUM BICARBONATE 8.4 % IV SOLN
Freq: Once | INTRAVENOUS | Status: AC
  Filled 2019-08-25: qty 100

## 2019-08-25 MED ORDER — OXYCODONE HCL ER 15 MG PO T12A
15.0000 mg | EXTENDED_RELEASE_TABLET | Freq: Two times a day (BID) | ORAL | Status: DC
  Administered 2019-08-25 – 2019-08-30 (×10): 15 mg via ORAL
  Filled 2019-08-25 (×10): qty 1

## 2019-08-25 MED ORDER — POLYETHYLENE GLYCOL 3350 17 G PO PACK: 17.0000 g | PACK | Freq: Every day | ORAL | Status: DC | PRN

## 2019-08-25 MED ORDER — DEXTROSE 50 % IV SOLN
1.0000 | Freq: Once | INTRAVENOUS | Status: AC
  Administered 2019-08-25: 50 mL via INTRAVENOUS
  Filled 2019-08-25: qty 50

## 2019-08-25 MED ORDER — NORTRIPTYLINE HCL 25 MG PO CAPS
25.0000 mg | ORAL_CAPSULE | Freq: Two times a day (BID) | ORAL | Status: DC
  Administered 2019-08-25 – 2019-08-30 (×10): 25 mg via ORAL
  Filled 2019-08-25 (×11): qty 1

## 2019-08-25 MED ORDER — SENNOSIDES-DOCUSATE SODIUM 8.6-50 MG PO TABS
1.0000 | ORAL_TABLET | Freq: Two times a day (BID) | ORAL | Status: DC
  Administered 2019-08-27: 1 via ORAL
  Filled 2019-08-25 (×10): qty 1

## 2019-08-25 MED ORDER — HYDROMORPHONE HCL 1 MG/ML IJ SOLN
1.0000 mg | INTRAMUSCULAR | Status: DC | PRN
  Administered 2019-08-25: 1 mg via SUBCUTANEOUS
  Filled 2019-08-25: qty 1

## 2019-08-25 MED ORDER — NALOXONE HCL 0.4 MG/ML IJ SOLN: 0.4000 mg | INTRAMUSCULAR | Status: DC | PRN

## 2019-08-25 MED ORDER — SODIUM CHLORIDE 0.9% FLUSH: 3.0000 mL | Freq: Once | INTRAVENOUS | Status: DC

## 2019-08-25 MED ORDER — SODIUM CHLORIDE 0.9% FLUSH: 9.0000 mL | INTRAVENOUS | Status: DC | PRN

## 2019-08-25 MED ORDER — SODIUM CHLORIDE 0.45 % IV SOLN: INTRAVENOUS | Status: DC

## 2019-08-25 MED ORDER — OXYCODONE HCL 5 MG PO TABS
10.0000 mg | ORAL_TABLET | Freq: Four times a day (QID) | ORAL | Status: DC | PRN
  Administered 2019-08-26: 10 mg via ORAL
  Filled 2019-08-25: qty 2

## 2019-08-25 MED ORDER — HYDROMORPHONE 1 MG/ML IV SOLN
INTRAVENOUS | Status: DC
  Administered 2019-08-25: 30 mg via INTRAVENOUS
  Administered 2019-08-25: 5.5 mg via INTRAVENOUS
  Administered 2019-08-25: 1.5 mg via INTRAVENOUS
  Administered 2019-08-26 (×2): 3 mg via INTRAVENOUS
  Administered 2019-08-26: 30 mg via INTRAVENOUS
  Administered 2019-08-26 (×2): 4 mg via INTRAVENOUS
  Administered 2019-08-26: 2 mg via INTRAVENOUS
  Administered 2019-08-27: 1.5 mg via INTRAVENOUS
  Administered 2019-08-27: 4 mg via INTRAVENOUS
  Administered 2019-08-27: 3 mg via INTRAVENOUS
  Administered 2019-08-27: 1.5 mg via INTRAVENOUS
  Administered 2019-08-27: 1 mg via INTRAVENOUS
  Administered 2019-08-27: 3 mg via INTRAVENOUS
  Administered 2019-08-27: 4 mg via INTRAVENOUS
  Administered 2019-08-28: 2.5 mg via INTRAVENOUS
  Administered 2019-08-28: 30 mg via INTRAVENOUS
  Administered 2019-08-28: 3 mg via INTRAVENOUS
  Administered 2019-08-28: 1.5 mg via INTRAVENOUS
  Administered 2019-08-29: 1 mg via INTRAVENOUS
  Administered 2019-08-29: 2 mg via INTRAVENOUS
  Administered 2019-08-29 (×3): 1.5 mg via INTRAVENOUS
  Administered 2019-08-29: 0.5 mg via INTRAVENOUS
  Administered 2019-08-30: 4.5 mg via INTRAVENOUS
  Administered 2019-08-30: 1 mg via INTRAVENOUS
  Administered 2019-08-30: 1.5 mg via INTRAVENOUS
  Administered 2019-08-30: 2.5 mg via INTRAVENOUS
  Filled 2019-08-25 (×4): qty 30

## 2019-08-25 MED ORDER — VITAMIN D3 25 MCG (1000 UNIT) PO TABS
4000.0000 [IU] | ORAL_TABLET | Freq: Every day | ORAL | Status: DC
  Administered 2019-08-26 – 2019-08-30 (×5): 4000 [IU] via ORAL
  Filled 2019-08-25 (×5): qty 4

## 2019-08-25 MED ORDER — DIPHENHYDRAMINE HCL 50 MG/ML IJ SOLN
25.0000 mg | Freq: Once | INTRAMUSCULAR | Status: AC
  Administered 2019-08-25: 25 mg via INTRAVENOUS
  Filled 2019-08-25: qty 1

## 2019-08-25 MED ORDER — SODIUM BICARBONATE 650 MG PO TABS
650.0000 mg | ORAL_TABLET | Freq: Two times a day (BID) | ORAL | Status: DC
  Administered 2019-08-25 – 2019-08-30 (×11): 650 mg via ORAL
  Filled 2019-08-25 (×11): qty 1

## 2019-08-25 MED ORDER — FOLIC ACID 1 MG PO TABS
1.0000 mg | ORAL_TABLET | Freq: Every day | ORAL | Status: DC
  Administered 2019-08-26 – 2019-08-30 (×5): 1 mg via ORAL
  Filled 2019-08-25 (×5): qty 1

## 2019-08-25 NOTE — ED Triage Notes (Signed)
Patient c/o sickle cell pain in his bilateral legs x 2 days. Patient states, "I have been taking so much of my pain medication that I am totally out now."

## 2019-08-25 NOTE — ED Notes (Signed)
IV team at bedside 

## 2019-08-25 NOTE — ED Notes (Signed)
CRITICAL VALUE ALERT  Critical Value:  Potassium 7.70mmol/L  Date & Time Notied:  08/21/2019 2081  Provider Notified: Francia Greaves MD  Orders Received/Actions taken: Provider aware.

## 2019-08-25 NOTE — ED Notes (Signed)
Called to speak with Inpatient RN. Concerns about the need for a telemetry bed. [facility] FNP messaged about the need of a telemetry bed verses a Med-surg bed. Awaiting reply at this time.

## 2019-08-25 NOTE — H&P (Signed)
H&P  Patient Demographics:  [name] [name], is a 39 y.o. male  MRN: [id number]   DOB - 1980-07-15  Admit Date - 08/25/2019  Outpatient Primary MD for the patient is [name], MD  Chief Complaint  Patient presents with   Sickle Cell Pain Crisis      HPI:   [name] [name]  is a 39 y.o. male with a medical history significant for sickle cell disease, chronic pain syndrome, opiate dependence and tolerance, history of anemia of chronic disease, history of polysubstance abuse, and history of CKD stage IV presented to ER with a 3-day history of bilateral upper and lower extremity pain.  He attributes current pain crisis to being out of pain medication that he routinely uses for pain control at home.  Home medications include OxyContin and oxycodone.  Patient has been attempting to hydrate as well.  He says that pain has been worsening over the past 24 hours and it has been difficult to ambulate.  Pain intensity is 9/10.  He denies any sick contacts, recent travel, or exposure to COVID-19.  No fever, chills, upper respiratory symptoms, urinary symptoms, nausea, vomiting, or diarrhea.  ER course: Patient found to have WBCs of 19.5, hemoglobin 8.1, and platelets 296.  Potassium 7.1, decreased to 5.3 following hydration.  Creatinine 3.19, which is above patient's baseline of 2.0-2.3.  COVID-19 test negative. Vital signs found to be BP 102/69    Pulse 88    Temp 98 F (36.7 C) (Oral)    Resp 16    Ht 5\' 3"  (1.6 m)    Wt 56.7 kg    SpO2 98%    BMI 22.14 kg/m Patient's pain persists despite IV fluids, bicarbonate, and IV Dilaudid.  Patient admitted to [place] and observation for further management of sickle cell pain crisis.    Review of systems:  In addition to the HPI above, patient reports No fever or chills No Headache, No changes with vision or hearing No problems swallowing food or liquids No chest pain, cough or shortness of breath No Abdominal pain, No Nausea or Vomiting, Bowel  movements are regular No blood in stool or urine No dysuria No new skin rashes or bruises No new joints pains-aches No new weakness, tingling, numbness in any extremity No recent weight gain or loss No polyuria, polydypsia or polyphagia No significant Mental Stressors  A full 10 point Review of Systems was done, except as stated above, all other Review of Systems were negative.  With Past History of the following :   Past Medical History:  Diagnosis Date   Abscess of right iliac fossa 09/24/2018   required Perc Drain 09/24/2018   Arachnoid Cyst of brain bilaterally    "2 really small ones in the back of my head; inside; saw them w/MRI" (09/25/2012)   Bacterial pneumonia ~ 2012   "caught it here in the hospital" (09/25/2012)   Chronic kidney disease    "from my sickle cell" (09/25/2012)   CKD (chronic kidney disease), stage II    Colitis 04/19/2017   CT scan abd/ pelvis   GERD (gastroesophageal reflux disease)    "after I eat alot of spicey foods" (09/25/2012)   Gynecomastia, male 07/10/2012   History of blood transfusion    "always related to sickle cell crisis" (09/25/2012)   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   Noted in noted from Hematology notes at [place] [facility]    "take RX qd to prevent them" (09/25/2012)   Opioid  dependence with withdrawal ([place]) 08/30/2012   Renal insufficiency    Sickle cell anemia (HCC)    Sickle cell crisis ([place]) 09/25/2012   Sickle cell nephropathy ([place]) 07/10/2012   Sinus tachycardia    Tachycardia with heart rate 121-140 beats per minute with ambulation 08/04/2016      Past Surgical History:  Procedure Laterality Date   ABCESS DRAINAGE     CHOLECYSTECTOMY  ~ 2012   COLONOSCOPY N/A 04/23/2017   Procedure: COLONOSCOPY;  Surgeon: Irene Shipper, MD;  Location: [facility];  Service: Endoscopy;  Laterality: N/A;   IR FLUORO GUIDE CV LINE RIGHT  12/17/2016   IR RADIOLOGIST EVAL & MGMT  10/02/2018   IR  RADIOLOGIST EVAL & MGMT  10/15/2018   IR REMOVAL TUN CV CATH W/O FL  12/21/2016   IR US GUIDE VASC ACCESS RIGHT  12/17/2016   spleenectomy       Social History:   Social History   Tobacco Use   Smoking status: Current Some Day Smoker    Packs/day: 0.10    Types: Cigarettes   Smokeless tobacco: Never Used   Tobacco comment: 09/25/2012 "I don't buy cigarettes; bum one from friends q now and then"  Substance Use Topics   Alcohol use: Yes    Alcohol/week: 0.0 standard drinks    Comment: Once a month     Lives - At home   Family History :   Family History  Problem Relation Age of Onset   Breast cancer Mother      Home Medications:   Prior to Admission medications   Medication Sig Start Date End Date Taking? Authorizing Provider  acetaminophen (TYLENOL) 500 MG tablet Take 1 tablet (500 mg total) by mouth every 6 (six) hours as needed. Patient taking differently: Take 500 mg by mouth every 6 (six) hours as needed for mild pain.  03/26/19  Yes [name], FNP  Cholecalciferol (VITAMIN D3) 50 MCG (2000 UT) TABS Take 100 mg by mouth daily.  03/18/19  Yes [provider]  folic acid (FOLVITE) 1 MG tablet Take 1 tablet (1 mg total) by mouth daily. 03/11/19  Yes [name], FNP  nortriptyline (PAMELOR) 25 MG capsule Take 1 capsule (25 mg total) by mouth 2 (two) times daily. 04/15/19  Yes [name], FNP  oxyCODONE (OXYCONTIN) 15 mg 12 hr tablet Take 1 tablet (15 mg total) by mouth every 12 (twelve) hours. 08/07/19  Yes [name], FNP  Oxycodone HCl 10 MG TABS Take 1 tablet (10 mg total) by mouth every 6 (six) hours as needed. Patient taking differently: Take 10 mg by mouth every 6 (six) hours as needed (pain).  08/05/19  Yes [name], FNP  sodium bicarbonate 650 MG tablet Take 650 mg by mouth 2 (two) times daily. 11/07/18  Yes [provider]  gabapentin (NEURONTIN) 300 MG capsule Take 1 capsule (300 mg total) by mouth 2 (two) times  daily as needed. Patient not taking: Reported on 06/12/2019 05/08/19   [name], FNP     Allergies:   Allergies  Allergen Reactions   Nsaids Other (See Comments)    Kidney disease   Morphine And Related Other (See Comments)    "Real bad headaches"      Physical Exam:   Vitals:   Vitals:   08/25/19 1405 08/25/19 1700  BP:  102/69  Pulse:  88  Resp: 19 16  Temp:  98 F (36.7 C)  SpO2: 100%  98%    Physical Exam: Constitutional: Patient appears well-developed and well-nourished. Not in obvious distress. HENT: Normocephalic, atraumatic, External right and left ear normal. Oropharynx is clear and moist.  Eyes: Conjunctivae and EOM are normal. PERRLA.  Scleral icterus Neck: Normal ROM. Neck supple. No JVD. No tracheal deviation. No thyromegaly. CVS: RRR, S1/S2 +, murmur present, no gallops, no carotid bruit.  Pulmonary: Effort and breath sounds normal, no stridor, rhonchi, wheezes, rales.  Abdominal: Soft. BS +, no distension, tenderness, rebound or guarding.  Musculoskeletal: Normal range of motion. No edema and no tenderness.  Lymphadenopathy: No lymphadenopathy noted, cervical, inguinal or axillary Neuro: Alert. Normal reflexes, muscle tone coordination. No cranial nerve deficit. Skin: Skin is warm and dry. No rash noted. Not diaphoretic. No erythema. No pallor. Psychiatric: Normal mood and affect. Behavior, judgment, thought content normal.   Data Review:   CBC Recent Labs  Lab 08/25/19 0853  WBC 19.5*  HGB 8.1*  HCT 24.5*  PLT 296  MCV 101.2*  MCH 33.5  MCHC 33.1  RDW 23.6*  LYMPHSABS 4.9*  MONOABS 1.4*  EOSABS 0.2  BASOSABS 0.0   ------------------------------------------------------------------------------------------------------------------  Chemistries  Recent Labs  Lab 08/25/19 0853 08/25/19 1212  NA 136 137  K 7.1* 5.3*  CL 114* 108  CO2 14* 18*  GLUCOSE 90 154*  BUN 44* 41*  CREATININE 3.19* 3.04*  CALCIUM 8.8* 8.5*  AST 40  --    ALT 20  --   ALKPHOS 225*  --   BILITOT 1.6*  --    ------------------------------------------------------------------------------------------------------------------ estimated creatinine clearance is 26.9 mL/min (A) (by C-G formula based on SCr of 3.04 mg/dL (H)). ------------------------------------------------------------------------------------------------------------------ No results for input(s): TSH, T4TOTAL, T3FREE, THYROIDAB in the last 72 hours.  Invalid input(s): FREET3  Coagulation profile No results for input(s): INR, PROTIME in the last 168 hours. ------------------------------------------------------------------------------------------------------------------- No results for input(s): DDIMER in the last 72 hours. -------------------------------------------------------------------------------------------------------------------  Cardiac Enzymes No results for input(s): CKMB, TROPONINI, MYOGLOBIN in the last 168 hours.  Invalid input(s): CK ------------------------------------------------------------------------------------------------------------------ No results found for: BNP  ---------------------------------------------------------------------------------------------------------------  Urinalysis    Component Value Date/Time   COLORURINE YELLOW 06/28/2019 [place] 06/28/2019 0738   LABSPEC 1.009 06/28/2019 0738   PHURINE 7.0 06/28/2019 0738   GLUCOSEU 50 (A) 06/28/2019 0738   HGBUR NEGATIVE 06/28/2019 0738   HGBUR small 03/10/2010 1445   BILIRUBINUR NEGATIVE 06/28/2019 0738   BILIRUBINUR neg 06/03/2019 1419   KETONESUR NEGATIVE 06/28/2019 0738   PROTEINUR >=300 (A) 06/28/2019 0738   UROBILINOGEN 0.2 06/03/2019 1419   UROBILINOGEN 0.2 05/11/2017 1335   NITRITE NEGATIVE 06/28/2019 0738   LEUKOCYTESUR NEGATIVE 06/28/2019 0738     ----------------------------------------------------------------------------------------------------------------   Imaging Results:    DG Chest Port 1 View  Result Date: 08/25/2019 CLINICAL DATA:  Sickle cell crisis. EXAM: PORTABLE CHEST 1 VIEW COMPARISON:  06/27/2019 and CT chest 02/27/2017. FINDINGS: Trachea is midline. Heart size stable. Lungs are clear. No pleural fluid. Changes of avascular necrosis are seen in the right humeral head. Suspect mild/early changes of avascular necrosis in the left humeral head. IMPRESSION: 1. No acute findings. 2. 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 Suspect early/mild AVN in the left humeral head. Electronically Signed   By: Lorin Picket M.D.   On: 08/25/2019 09:09      Assessment & Plan:  Principal Problem:   Sickle cell pain crisis ([place]) Active Problems:   CKD (chronic kidney disease), stage IV (HCC)   Systolic murmur   Acute-on-chronic kidney  injury ([place])   Leukocytosis   Chronic pain syndrome   Renal insufficiency   Sickle cell disease with pain crisis: Admit to [place].  Initiate IV Dilaudid PCA with settings of 0.5 mg, 10-minute lockout, and 3 mg/h. IV fluids, 0.45% saline at 100 mL/h Oxycodone 10 mg every 6 hours as needed for severe breakthrough pain Continue OxyContin per home dose Monitor vital signs closely, reevaluate pain scale regularly, and supplemental oxygen as needed. Pain will be reevaluated in context of function and relationship to baseline as care progresses  Chronic pain syndrome: Continue home medications  Sickle cell anemia: Hemoglobin 8.1, which is consistent with patient's baseline.  There is no clinical indication for blood transfusion at this time.  Continue to monitor closely.  Repeat CBC in a.m.  Acute superimposed on chronic kidney disease: Creatinine elevated at 3.1, which is above patient's baseline of 2.2-2.3.  Gentle hydration.  Avoid all nephrotoxins.  He is followed by outpatient  nephrology.  Leukocytosis: Mild leukocytosis.  Suspected to be reactive.  Patient is afebrile without any signs of infection or inflammation.  Continue to follow closely without antibiotics.  Repeat CBC in a.m.  History of polysubstance abuse: Urine drug screen pending.   DVT Prophylaxis: SCDs  AM Labs Ordered, also please review Full Orders  Family Communication: Admission, patient's condition and plan of care including tests being ordered have been discussed with the patient who indicate understanding and agree with the plan and Code Status.  Code Status: Full Code  Consults called: None    Admission status: Inpatient    Time spent in minutes : 50 minutes  [place], MSN, FNP-C Patient [facility] [address] [place],  [address] [phone]  08/25/2019 at 5:18 PM

## 2019-08-25 NOTE — ED Notes (Signed)
Per Smith Robert FNP: Pt can be transported to Med surg bed at this time and can be upgraded if needed. Report given to [place]
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
## 2019-08-25 NOTE — ED Notes (Signed)
Pt provided with [place] sandwich and water at this time.

## 2019-08-25 NOTE — ED Notes (Signed)
Awaiting Sodium Bicarb from pharmacy

## 2019-08-25 NOTE — ED Notes (Signed)
Pt requesting pain medication. Hollis FNP made aware at this time.

## 2019-08-25 NOTE — ED Notes (Signed)
[facility] FNP, made aware that the bed request needs to be changed to a telemetry bed. Awaiting for bed change

## 2019-08-26 DIAGNOSIS — N179 Acute kidney failure, unspecified: Secondary | ICD-10-CM | POA: Diagnosis present

## 2019-08-26 DIAGNOSIS — N184 Chronic kidney disease, stage 4 (severe): Secondary | ICD-10-CM | POA: Diagnosis present

## 2019-08-26 DIAGNOSIS — G43909 Migraine, unspecified, not intractable, without status migrainosus: Secondary | ICD-10-CM | POA: Diagnosis present

## 2019-08-26 DIAGNOSIS — K219 Gastro-esophageal reflux disease without esophagitis: Secondary | ICD-10-CM | POA: Diagnosis present

## 2019-08-26 DIAGNOSIS — Z803 Family history of malignant neoplasm of breast: Secondary | ICD-10-CM | POA: Diagnosis not present

## 2019-08-26 DIAGNOSIS — Z20822 Contact with and (suspected) exposure to covid-19: Secondary | ICD-10-CM | POA: Diagnosis present

## 2019-08-26 DIAGNOSIS — F17210 Nicotine dependence, cigarettes, uncomplicated: Secondary | ICD-10-CM | POA: Diagnosis present

## 2019-08-26 DIAGNOSIS — E875 Hyperkalemia: Secondary | ICD-10-CM | POA: Diagnosis present

## 2019-08-26 DIAGNOSIS — Z885 Allergy status to narcotic agent status: Secondary | ICD-10-CM | POA: Diagnosis not present

## 2019-08-26 DIAGNOSIS — Z79899 Other long term (current) drug therapy: Secondary | ICD-10-CM | POA: Diagnosis not present

## 2019-08-26 DIAGNOSIS — Z886 Allergy status to analgesic agent status: Secondary | ICD-10-CM | POA: Diagnosis not present

## 2019-08-26 DIAGNOSIS — E559 Vitamin D deficiency, unspecified: Secondary | ICD-10-CM | POA: Diagnosis present

## 2019-08-26 DIAGNOSIS — G894 Chronic pain syndrome: Secondary | ICD-10-CM | POA: Diagnosis present

## 2019-08-26 DIAGNOSIS — Z79891 Long term (current) use of opiate analgesic: Secondary | ICD-10-CM | POA: Diagnosis not present

## 2019-08-26 DIAGNOSIS — R011 Cardiac murmur, unspecified: Secondary | ICD-10-CM | POA: Diagnosis present

## 2019-08-26 DIAGNOSIS — D72829 Elevated white blood cell count, unspecified: Secondary | ICD-10-CM | POA: Diagnosis present

## 2019-08-26 DIAGNOSIS — D5700 Hb-SS disease with crisis, unspecified: Secondary | ICD-10-CM | POA: Diagnosis present

## 2019-08-26 LAB — CBC
HCT: 20.7 % — ABNORMAL LOW (ref 39.0–52.0)
Hemoglobin: 6.8 g/dL — CL (ref 13.0–17.0)
MCH: 33 pg (ref 26.0–34.0)
MCHC: 32.9 g/dL (ref 30.0–36.0)
MCV: 100.5 fL — ABNORMAL HIGH (ref 80.0–100.0)
Platelets: 287 10*3/uL (ref 150–400)
RBC: 2.06 MIL/uL — ABNORMAL LOW (ref 4.22–5.81)
RDW: 23.3 % — ABNORMAL HIGH (ref 11.5–15.5)
WBC: 21.9 10*3/uL — ABNORMAL HIGH (ref 4.0–10.5)
nRBC: 0.4 % — ABNORMAL HIGH (ref 0.0–0.2)

## 2019-08-26 LAB — BASIC METABOLIC PANEL
Anion gap: 9 (ref 5–15)
BUN: 38 mg/dL — ABNORMAL HIGH (ref 6–20)
CO2: 16 mmol/L — ABNORMAL LOW (ref 22–32)
Calcium: 8.8 mg/dL — ABNORMAL LOW (ref 8.9–10.3)
Chloride: 109 mmol/L (ref 98–111)
Creatinine, Ser: 2.57 mg/dL — ABNORMAL HIGH (ref 0.61–1.24)
GFR calc Af Amer: 36 mL/min — ABNORMAL LOW (ref >60)
GFR calc non Af Amer: 31 mL/min — ABNORMAL LOW (ref >60)
Glucose, Bld: 94 mg/dL (ref 70–99)
Potassium: 5.2 mmol/L — ABNORMAL HIGH (ref 3.5–5.1)
Sodium: 134 mmol/L — ABNORMAL LOW (ref 135–145)

## 2019-08-26 MED ORDER — DARBEPOETIN ALFA 150 MCG/0.3ML IJ SOSY
150.0000 ug | PREFILLED_SYRINGE | Freq: Once | INTRAMUSCULAR | Status: AC
  Administered 2019-08-26: 150 ug via SUBCUTANEOUS
  Filled 2019-08-26: qty 0.3

## 2019-08-26 MED ORDER — OXYCODONE HCL 5 MG PO TABS
10.0000 mg | ORAL_TABLET | ORAL | Status: DC | PRN
  Administered 2019-08-26 – 2019-08-27 (×5): 10 mg via ORAL
  Filled 2019-08-26 (×5): qty 2

## 2019-08-26 NOTE — Progress Notes (Signed)
[name] Hollis NP notified of patient hemoglobin 6.8. Orders to follow.

## 2019-08-26 NOTE — Progress Notes (Addendum)
Subjective: [name] [name] is a 39 year old male with a medical history significant for sickle cell disease, chronic pain syndrome, opiate dependence and tolerance, history of anemia of chronic disease, history of polysubstance abuse, and history of CKD stage IV was admitted for sickle cell pain crisis.  Patient continues to complain of significant pain primarily to bilateral lower extremities.  Pain intensity is 8/10 characterized as constant and throbbing.  Patient also endorses some swelling to right ankle.  Patient says that it has been difficult to ambulate without assistance. He denies headache, chest pain, urinary symptoms, nausea, vomiting, or diarrhea.  Objective:  Vital signs in last 24 hours:  Vitals:   08/26/19 0721 08/26/19 0949 08/26/19 1146 08/26/19 1517  BP:  104/67  108/67  Pulse:  90  92  Resp: 14 16 14 14   Temp:  97.8 F (36.6 C)  98 F (36.7 C)  TempSrc:  Oral  Oral  SpO2: 96% 100% 98% 98%  Weight:      Height:        Intake/Output from previous day:   Intake/Output Summary (Last 24 hours) at 08/26/2019 1646 Last data filed at 08/26/2019 1300 Gross per 24 hour  Intake 130.2 ml  Output 1950 ml  Net -1819.8 ml    Physical Exam: General: Alert, awake, oriented x3, in no acute distress.  HEENT: [place]/AT PEERL, EOMI Neck: Trachea midline,  no masses, no thyromegal,y no JVD, no carotid bruit OROPHARYNX:  Moist, No exudate/ erythema/lesions.  Heart: Regular rate and rhythm, without murmurs, rubs, gallops, PMI non-displaced, no heaves or thrills on palpation.  Lungs: Clear to auscultation, no wheezing or rhonchi noted. No increased vocal fremitus resonant to percussion  Abdomen: Soft, nontender, nondistended, positive bowel sounds, no masses no hepatosplenomegaly noted..  Neuro: No focal neurological deficits noted cranial nerves II through XII grossly intact. DTRs 2+ bilaterally upper and lower extremities. Strength 5 out of 5 in bilateral upper and lower  extremities. Musculoskeletal: No warm swelling or erythema around joints, no spinal tenderness noted. Psychiatric: Patient alert and oriented x3, good insight and cognition, good recent to remote recall. Lymph node survey: No cervical axillary or inguinal lymphadenopathy noted.  Lab Results:  Basic Metabolic Panel:    Component Value Date/Time   NA 134 (L) 08/26/2019 0632   NA 138 06/03/2019 1423   K 5.2 (H) 08/26/2019 0632   CL 109 08/26/2019 0632   CO2 16 (L) 08/26/2019 0632   BUN 38 (H) 08/26/2019 0632   BUN 31 (H) 06/03/2019 1423   CREATININE 2.57 (H) 08/26/2019 0632   CREATININE 1.45 (H) 05/12/2014 1430   GLUCOSE 94 08/26/2019 0632   CALCIUM 8.8 (L) 08/26/2019 0632   CBC:    Component Value Date/Time   WBC 21.9 (H) 08/26/2019 0632   HGB 6.8 (LL) 08/26/2019 0632   HGB 6.1 (LL) 08/12/2019 0912   HCT 20.7 (L) 08/26/2019 0632   HCT 18.2 (L) 08/12/2019 0912   PLT 287 08/26/2019 0632   PLT 331 08/12/2019 0912   MCV 100.5 (H) 08/26/2019 0632   MCV 92 08/12/2019 0912   NEUTROABS 12.8 (H) 08/25/2019 0853   NEUTROABS 13.8 (H) 08/12/2019 0912   LYMPHSABS 4.9 (H) 08/25/2019 0853   LYMPHSABS 3.8 (H) 08/12/2019 0912   MONOABS 1.4 (H) 08/25/2019 0853   EOSABS 0.2 08/25/2019 0853   EOSABS 0.4 08/12/2019 0912   BASOSABS 0.0 08/25/2019 0853   BASOSABS 0.0 08/12/2019 0912    Recent Results (from the past 240 hour(s))  SARS Coronavirus 2  by RT PCR (hospital order, performed in [facility] hospital lab) Nasopharyngeal Nasopharyngeal Swab     Status: None   Collection Time: 08/25/19 11:37 AM   Specimen: Nasopharyngeal Swab  Result Value Ref Range Status   SARS Coronavirus 2 NEGATIVE NEGATIVE Final    Comment: (NOTE) SARS-CoV-2 target nucleic acids are NOT DETECTED.  The SARS-CoV-2 RNA is generally detectable in upper and lower respiratory specimens during the acute phase of infection. The lowest concentration of SARS-CoV-2 viral copies this assay can detect is 250 copies / mL.  A negative result does not preclude SARS-CoV-2 infection and should not be used as the sole basis for treatment or other patient management decisions.  A negative result may occur with improper specimen collection / handling, submission of specimen other than nasopharyngeal swab, presence of viral mutation(s) within the areas targeted by this assay, and inadequate number of viral copies (<250 copies / mL). A negative result must be combined with clinical observations, patient history, and epidemiological information.  Fact Sheet for Patients:   StrictlyIdeas.no  Fact Sheet for Healthcare Providers: BankingDealers.co.za  This test is not yet approved or  cleared by the [place] FDA and has been authorized for detection and/or diagnosis of SARS-CoV-2 by FDA under an Emergency Use Authorization (EUA).  This EUA will remain in effect (meaning this test can be used) for the duration of the COVID-19 declaration under Section 564(b)(1) of the Act, 21 U.S.C. section 360bbb-3(b)(1), unless the authorization is terminated or revoked sooner.  Performed at [facility], [place] [address], [place], [place] [address]     Studies/Results: DG Chest Port 1 View  Result Date: 08/25/2019 CLINICAL DATA:  Sickle cell crisis. EXAM: PORTABLE CHEST 1 VIEW COMPARISON:  06/27/2019 and CT chest 02/27/2017. FINDINGS: Trachea is midline. Heart size stable. Lungs are clear. No pleural fluid. Changes of avascular necrosis are seen in the right humeral head. Suspect mild/early changes of avascular necrosis in the left humeral head. IMPRESSION: 1. No acute findings. 2. 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 Suspect early/mild AVN in the left humeral head. Electronically Signed   By: Lorin Picket M.D.   On: 08/25/2019 09:09    Medications: Scheduled Meds: . cholecalciferol  4,000 Units Oral Daily  . folic acid  1 mg Oral Daily  .  HYDROmorphone   Intravenous Q4H  . nortriptyline  25 mg Oral BID  . oxyCODONE  15 mg Oral Q12H  . senna-docusate  1 tablet Oral BID  . sodium bicarbonate  650 mg Oral BID   Continuous Infusions: PRN Meds:.naloxone **AND** sodium chloride flush, oxyCODONE, polyethylene glycol  Consultants:  None  Procedures:  None  Antibiotics:  None  Assessment/Plan: Principal Problem:   Sickle cell pain crisis ([place]) Active Problems:   CKD (chronic kidney disease), stage IV (HCC)   Systolic murmur   Acute-on-chronic kidney injury (HCC)   Leukocytosis   Chronic pain syndrome   Renal insufficiency   Sickle cell disease with pain crisis: Continue IV Dilaudid PCA, no changes in settings on today. Decrease IV fluids to [facility] Monitor vital signs closely, reevaluate pain scale regularly, and supplemental oxygen as needed.  Chronic pain syndrome: Continue home medications  Sickle cell anemia: Hemoglobin has decreased to 6.8, which is below patient's baseline.  Suspected to be hemodilution.  Decrease IV fluids.  Continue to monitor closely and repeat CBC in a.m.  Acute kidney injury superimposed on CKD: Creatinine has improved to 2.5 from 3.1 yesterday.  Continue gentle  hydration.  Avoid all nephrotoxins.  He is followed by outpatient nephrology.  Leukocytosis: Stable.  Suspected to be reactive.  Patient is afebrile without any signs of infection or inflammation.  Continue to follow closely without antibiotics.  Repeat CBC in a.m.  History of polysubstance abuse: Urine drug screen negative for cocaine  Hyperkalemia: 5.3 from 7.1 on yesterday.  Continue hydration.  Monitor closely.  Follow BMP in a.m.   Code Status: Full Code Family Communication: N/A Disposition Plan: Not yet ready for discharge   [place], MSN, FNP-C [facility] [address] [place], [place] [address] [phone]  If 5PM-8AM, please contact  night-coverage.  08/26/2019, 4:46 PM  LOS: 0 days

## 2019-08-27 LAB — CBC
HCT: 19.7 % — ABNORMAL LOW (ref 39.0–52.0)
Hemoglobin: 6.7 g/dL — CL (ref 13.0–17.0)
MCH: 32.8 pg (ref 26.0–34.0)
MCHC: 34 g/dL (ref 30.0–36.0)
MCV: 96.6 fL (ref 80.0–100.0)
Platelets: 304 10*3/uL (ref 150–400)
RBC: 2.04 MIL/uL — ABNORMAL LOW (ref 4.22–5.81)
RDW: 23.8 % — ABNORMAL HIGH (ref 11.5–15.5)
WBC: 18.4 10*3/uL — ABNORMAL HIGH (ref 4.0–10.5)
nRBC: 0.4 % — ABNORMAL HIGH (ref 0.0–0.2)

## 2019-08-27 MED ORDER — OXYCODONE HCL 5 MG PO TABS
15.0000 mg | ORAL_TABLET | ORAL | Status: DC | PRN
  Administered 2019-08-27 – 2019-08-29 (×9): 15 mg via ORAL
  Filled 2019-08-27 (×10): qty 3

## 2019-08-27 NOTE — Progress Notes (Signed)
Subjective: [name] [name] is a 39 year old male with a medical history significant for sickle cell disease, chronic pain syndrome, opiate dependence and tolerance, history of anemia of chronic disease, history of polysubstance abuse and history of CKD stage IV was admitted for sickle cell pain crisis.  Patient continues to complain of significant pain primarily to bilateral lower extremities.  He says the pain intensity is 7/10, constant, and throbbing.  Patient is requesting an increase in pain medications.  He says that it is been difficult to apply weight to lower extremities.  He denies headache, chest pain, urinary symptoms, nausea, vomiting, or diarrhea.  Objective:  Vital signs in last 24 hours:  Vitals:   08/27/19 0635 08/27/19 0831 08/27/19 0959 08/27/19 1119  BP: (!) 99/56  113/66   Pulse: 73  (!) 106   Resp: 14 14 18 17   Temp: 98.2 F (36.8 C)  98.8 F (37.1 C)   TempSrc: Oral  Oral   SpO2: 93% 93% 97% 96%  Weight:      Height:        Intake/Output from previous day:   Intake/Output Summary (Last 24 hours) at 08/27/2019 1340 Last data filed at 08/27/2019 0636 Gross per 24 hour  Intake --  Output 1415 ml  Net -1415 ml    Physical Exam: General: Alert, awake, oriented x3, in no acute distress.  HEENT: [place]/AT [place]ERL, EOMI Neck: Trachea midline,  no masses, no thyromegal,y no JVD, no carotid bruit OROPHARYNX:  Moist, No exudate/ erythema/lesions.  Heart: Regular rate and rhythm, without murmurs, rubs, gallops, PMI non-displaced, no heaves or thrills on palpation.  Lungs: Clear to auscultation, no wheezing or rhonchi noted. No increased vocal fremitus resonant to percussion  Abdomen: Soft, nontender, nondistended, positive bowel sounds, no masses no hepatosplenomegaly noted..  Neuro: No focal neurological deficits noted cranial nerves II through XII grossly intact. DTRs 2+ bilaterally upper and lower extremities. Strength 5 out of 5 in bilateral upper and lower  extremities. Musculoskeletal: No warm swelling or erythema around joints, no spinal tenderness noted. Psychiatric: Patient alert and oriented x3, good insight and cognition, good recent to remote recall. Lymph node survey: No cervical axillary or inguinal lymphadenopathy noted.  Lab Results:  Basic Metabolic Panel:    Component Value Date/Time   NA 134 (L) 08/26/2019 0632   NA 138 06/03/2019 1423   K 5.2 (H) 08/26/2019 0632   CL 109 08/26/2019 0632   CO2 16 (L) 08/26/2019 0632   BUN 38 (H) 08/26/2019 0632   BUN 31 (H) 06/03/2019 1423   CREATININE 2.57 (H) 08/26/2019 0632   CREATININE 1.45 (H) 05/12/2014 1430   GLUCOSE 94 08/26/2019 0632   CALCIUM 8.8 (L) 08/26/2019 0632   CBC:    Component Value Date/Time   WBC 18.4 (H) 08/27/2019 0704   HGB 6.7 (LL) 08/27/2019 0704   HGB 6.1 (LL) 08/12/2019 0912   HCT 19.7 (L) 08/27/2019 0704   HCT 18.2 (L) 08/12/2019 0912   PLT 304 08/27/2019 0704   PLT 331 08/12/2019 0912   MCV 96.6 08/27/2019 0704   MCV 92 08/12/2019 0912   NEUTROABS 12.8 (H) 08/25/2019 0853   NEUTROABS 13.8 (H) 08/12/2019 0912   LYMPHSABS 4.9 (H) 08/25/2019 0853   LYMPHSABS 3.8 (H) 08/12/2019 0912   MONOABS 1.4 (H) 08/25/2019 0853   EOSABS 0.2 08/25/2019 0853   EOSABS 0.4 08/12/2019 0912   BASOSABS 0.0 08/25/2019 0853   BASOSABS 0.0 08/12/2019 0912    Recent Results (from the past 240 hour(s))  SARS Coronavirus 2 by RT PCR (hospital order, performed in [facility] hospital lab) Nasopharyngeal Nasopharyngeal Swab     Status: None   Collection Time: 08/25/19 11:37 AM   Specimen: Nasopharyngeal Swab  Result Value Ref Range Status   SARS Coronavirus 2 NEGATIVE NEGATIVE Final    Comment: (NOTE) SARS-CoV-2 target nucleic acids are NOT DETECTED.  The SARS-CoV-2 RNA is generally detectable in upper and lower respiratory specimens during the acute phase of infection. The lowest concentration of SARS-CoV-2 viral copies this assay can detect is 250 copies / mL. A  negative result does not preclude SARS-CoV-2 infection and should not be used as the sole basis for treatment or other patient management decisions.  A negative result may occur with improper specimen collection / handling, submission of specimen other than nasopharyngeal swab, presence of viral mutation(s) within the areas targeted by this assay, and inadequate number of viral copies (<250 copies / mL). A negative result must be combined with clinical observations, patient history, and epidemiological information.  Fact Sheet for Patients:   StrictlyIdeas.no  Fact Sheet for Healthcare Providers: BankingDealers.co.za  This test is not yet approved or  cleared by the [place] FDA and has been authorized for detection and/or diagnosis of SARS-CoV-2 by FDA under an Emergency Use Authorization (EUA).  This EUA will remain in effect (meaning this test can be used) for the duration of the COVID-19 declaration under Section 564(b)(1) of the Act, 21 U.S.C. section 360bbb-3(b)(1), unless the authorization is terminated or revoked sooner.  Performed at [facility], [place] [address], [place], [place] [address]     Studies/Results: No results found.  Medications: Scheduled Meds: . cholecalciferol  4,000 Units Oral Daily  . folic acid  1 mg Oral Daily  . HYDROmorphone   Intravenous Q4H  . nortriptyline  25 mg Oral BID  . oxyCODONE  15 mg Oral Q12H  . senna-docusate  1 tablet Oral BID  . sodium bicarbonate  650 mg Oral BID   Continuous Infusions: PRN Meds:.naloxone **AND** sodium chloride flush, oxyCODONE, polyethylene glycol  Consultants:  None  Procedures:  None  Antibiotics:  None  Assessment/Plan: Principal Problem:   Sickle cell pain crisis ([place]) Active Problems:   CKD (chronic kidney disease), stage IV (HCC)   Systolic murmur   Acute-on-chronic kidney injury (HCC)   Hyperkalemia    Leukocytosis   Chronic pain syndrome   Renal insufficiency  Sickle cell disease with pain crisis: Continue IV Dilaudid PCA without any changes in settings on today continue IV fluids at [facility] oxycodone 15 mg every 4 hours as needed for severe breakthrough pain monitor vital signs closely, reevaluate pain scale regularly, and supplemental oxygen as needed  chronic pain syndrome: Continue home medications  sickle cell anemia: Hemoglobin stable at 6.7, slightly below patient's baseline.  Patient is s/p Aranesp injection.  Repeat CBC in a.m. if hemoglobin is less than 6.0 g/dL, transfuse 1 unit PRBCs.  Acute kidney injury superimposed on CKD: Creatinine improved to 2.5.  Continue gentle hydration.  Avoid all nephrotoxins.  Repeat BMP in a.m.  Patient to follow-up with nephrology as an outpatient.  Leukocytosis: Stable.  Suspected to be reactive.  Patient continues to be afebrile without any signs of infection or inflammation.  Continue to follow closely without antibiotics.  Follow CBC.  Hyperkalemia: Stable.  Continue to monitor closely.  BMP in a.m.    Code Status: Full Code Family Communication: N/A Disposition Plan: Not yet ready for discharge  [place]  Al Decant  APRN, MSN, FNP-C Patient [facility] [address] [place], [place] [address] [phone]  If 5PM-8AM, please contact night-coverage.  08/27/2019, 1:40 PM  LOS: 1 day

## 2019-08-28 LAB — BASIC METABOLIC PANEL
Anion gap: 9 (ref 5–15)
BUN: 34 mg/dL — ABNORMAL HIGH (ref 6–20)
CO2: 16 mmol/L — ABNORMAL LOW (ref 22–32)
Calcium: 9.1 mg/dL (ref 8.9–10.3)
Chloride: 108 mmol/L (ref 98–111)
Creatinine, Ser: 2.43 mg/dL — ABNORMAL HIGH (ref 0.61–1.24)
GFR calc Af Amer: 38 mL/min — ABNORMAL LOW (ref >60)
GFR calc non Af Amer: 33 mL/min — ABNORMAL LOW (ref >60)
Glucose, Bld: 86 mg/dL (ref 70–99)
Potassium: 5.2 mmol/L — ABNORMAL HIGH (ref 3.5–5.1)
Sodium: 133 mmol/L — ABNORMAL LOW (ref 135–145)

## 2019-08-28 LAB — CBC
HCT: 21.2 % — ABNORMAL LOW (ref 39.0–52.0)
Hemoglobin: 7.2 g/dL — ABNORMAL LOW (ref 13.0–17.0)
MCH: 32.7 pg (ref 26.0–34.0)
MCHC: 34 g/dL (ref 30.0–36.0)
MCV: 96.4 fL (ref 80.0–100.0)
Platelets: 194 10*3/uL (ref 150–400)
RBC: 2.2 MIL/uL — ABNORMAL LOW (ref 4.22–5.81)
RDW: 24.1 % — ABNORMAL HIGH (ref 11.5–15.5)
WBC: 14 10*3/uL — ABNORMAL HIGH (ref 4.0–10.5)
nRBC: 0.5 % — ABNORMAL HIGH (ref 0.0–0.2)

## 2019-08-28 MED ORDER — SODIUM CHLORIDE 0.45 % IV SOLN: INTRAVENOUS | Status: AC

## 2019-08-28 MED ORDER — HYDROMORPHONE HCL 1 MG/ML IJ SOLN
1.0000 mg | Freq: Once | INTRAMUSCULAR | Status: AC
  Administered 2019-08-28: 1 mg via INTRAVENOUS

## 2019-08-28 NOTE — Progress Notes (Signed)
Pt ambulated in hall from his room to the window at the end of the hall and back, slow but steady. C/o pain 8/10 mostly in his Rt knee and thigh area. Hortencia Conradi RN

## 2019-08-28 NOTE — Progress Notes (Signed)
Subjective: [name] [name] is a 39 year old male with a medical history significant for sickle cell disease, chronic pain syndrome, opiate dependence and tolerance, history of anemia of chronic disease, history of polysubstance abuse, and history of CKD stage IV was admitted for sickle cell pain crisis.  Patient continues to complain of pain primarily to bilateral lower extremities.  He has no new complaints on today.  He rates pain as 7/10.  He denies any headache, chest pain, urinary symptoms, nausea, vomiting, or diarrhea.  Objective:  Vital signs in last 24 hours:  Vitals:   08/28/19 0314 08/28/19 0532 08/28/19 0759 08/28/19 0943  BP:  108/67  104/61  Pulse:  94  99  Resp: 17 16 14 16   Temp:  97.7 F (36.5 C)  98.4 F (36.9 C)  TempSrc:  Oral  Oral  SpO2: 93% 94% 98% 96%  Weight:      Height:        Intake/Output from previous day:   Intake/Output Summary (Last 24 hours) at 08/28/2019 1157 Last data filed at 08/28/2019 0534 Gross per 24 hour  Intake --  Output 850 ml  Net -850 ml    Physical Exam: General: Alert, awake, oriented x3, in no acute distress.  HEENT: [place]/AT [place], EOMI Neck: Trachea midline,  no masses, no thyromegal,y no JVD, no carotid bruit OROPHARYNX:  Moist, No exudate/ erythema/lesions.  Heart: Regular rate and rhythm, Murmur present, rubs, gallops, PMI non-displaced, no heaves or thrills on palpation.  Lungs: Clear to auscultation, no wheezing or rhonchi noted. No increased vocal fremitus resonant to percussion  Abdomen: Soft, nontender, nondistended, positive bowel sounds, no masses no hepatosplenomegaly noted..  Neuro: No focal neurological deficits noted cranial nerves II through XII grossly intact. DTRs 2+ bilaterally upper and lower extremities. Strength 5 out of 5 in bilateral upper and lower extremities. Musculoskeletal: No warm swelling or erythema around joints, no spinal tenderness noted. Psychiatric: Patient alert and oriented x3, good  insight and cognition, good recent to remote recall. Lymph node survey: No cervical axillary or inguinal lymphadenopathy noted.  Lab Results:  Basic Metabolic Panel:    Component Value Date/Time   NA 133 (L) 08/28/2019 0557   NA 138 06/03/2019 1423   K 5.2 (H) 08/28/2019 0557   CL 108 08/28/2019 0557   CO2 16 (L) 08/28/2019 0557   BUN 34 (H) 08/28/2019 0557   BUN 31 (H) 06/03/2019 1423   CREATININE 2.43 (H) 08/28/2019 0557   CREATININE 1.45 (H) 05/12/2014 1430   GLUCOSE 86 08/28/2019 0557   CALCIUM 9.1 08/28/2019 0557   CBC:    Component Value Date/Time   WBC 14.0 (H) 08/28/2019 0557   HGB 7.2 (L) 08/28/2019 0557   HGB 6.1 (LL) 08/12/2019 0912   HCT 21.2 (L) 08/28/2019 0557   HCT 18.2 (L) 08/12/2019 0912   PLT 194 08/28/2019 0557   PLT 331 08/12/2019 0912   MCV 96.4 08/28/2019 0557   MCV 92 08/12/2019 0912   NEUTROABS 12.8 (H) 08/25/2019 0853   NEUTROABS 13.8 (H) 08/12/2019 0912   LYMPHSABS 4.9 (H) 08/25/2019 0853   LYMPHSABS 3.8 (H) 08/12/2019 0912   MONOABS 1.4 (H) 08/25/2019 0853   EOSABS 0.2 08/25/2019 0853   EOSABS 0.4 08/12/2019 0912   BASOSABS 0.0 08/25/2019 0853   BASOSABS 0.0 08/12/2019 0912    Recent Results (from the past 240 hour(s))  SARS Coronavirus 2 by RT PCR (hospital order, performed in [place] hospital lab) Nasopharyngeal Nasopharyngeal Swab     Status:  None   Collection Time: 08/25/19 11:37 AM   Specimen: Nasopharyngeal Swab  Result Value Ref Range Status   SARS Coronavirus 2 NEGATIVE NEGATIVE Final    Comment: (NOTE) SARS-CoV-2 target nucleic acids are NOT DETECTED.  The SARS-CoV-2 RNA is generally detectable in upper and lower respiratory specimens during the acute phase of infection. The lowest concentration of SARS-CoV-2 viral copies this assay can detect is 250 copies / mL. A negative result does not preclude SARS-CoV-2 infection and should not be used as the sole basis for treatment or other patient management decisions.  A  negative result may occur with improper specimen collection / handling, submission of specimen other than nasopharyngeal swab, presence of viral mutation(s) within the areas targeted by this assay, and inadequate number of viral copies (<250 copies / mL). A negative result must be combined with clinical observations, patient history, and epidemiological information.  Fact Sheet for Patients:   StrictlyIdeas.no  Fact Sheet for Healthcare Providers: BankingDealers.co.za  This test is not yet approved or  cleared by the [place] FDA and has been authorized for detection and/or diagnosis of SARS-CoV-2 by FDA under an Emergency Use Authorization (EUA).  This EUA will remain in effect (meaning this test can be used) for the duration of the COVID-19 declaration under Section 564(b)(1) of the Act, 21 U.S.C. section 360bbb-3(b)(1), unless the authorization is terminated or revoked sooner.  Performed at [facility], [place] [address], [place], [place] [address]     Studies/Results: No results found.  Medications: Scheduled Meds: . cholecalciferol  4,000 Units Oral Daily  . folic acid  1 mg Oral Daily  . HYDROmorphone   Intravenous Q4H  . nortriptyline  25 mg Oral BID  . oxyCODONE  15 mg Oral Q12H  . senna-docusate  1 tablet Oral BID  . sodium bicarbonate  650 mg Oral BID   Continuous Infusions: . sodium chloride     PRN Meds:.naloxone **AND** sodium chloride flush, oxyCODONE, polyethylene glycol  Consultants:  None  Procedures:  None  Antibiotics:  None  Assessment/Plan: Principal Problem:   Sickle cell pain crisis ([place]) Active Problems:   CKD (chronic kidney disease), stage IV (HCC)   Systolic murmur   Acute-on-chronic kidney injury (HCC)   Hyperkalemia   Leukocytosis   Chronic pain syndrome   Renal insufficiency  Sickle cell disease with pain crisis: Continue IV Dilaudid PCA with settings  of 0.5 mg, 10-minute lockout, and 2 mg/h Oxycodone 15 mg every 4 hours as needed for severe breakthrough pain Monitor vital signs closely, reevaluate pain scale regularly, and supplemental oxygen as needed  Chronic pain syndrome: Continue home medications  Sickle cell anemia: Hemoglobin stable at 7.2.  Patient is s/p Aranesp injection.  Repeat CBC in a.m.  Acute kidney injury superimposed on CKD: Creatinine has improved to 2.4.  Continue gentle hydration.  Avoid all nephrotoxins.  Repeat BMP in a.m.  Patient is to follow-up with nephrology as an outpatient.  Leukocytosis: Stable.  Trending down.  Suspected to be reactive.  Patient continues to be afebrile without any signs of infection or inflammation.  Continue to follow closely.  Hyperkalemia: Potassium 5.2.  Continue gentle hydration.  BMP in a.m.  Code Status: Full Code Family Communication: N/A Disposition Plan: Not yet ready for discharge  [place], MSN, FNP-C Patient [facility] [address] [place], [place] [address] [phone]  If 5PM-8AM, please contact night-coverage.  08/28/2019, 11:57 AM  LOS:  2 days

## 2019-08-29 LAB — CBC
HCT: 20.8 % — ABNORMAL LOW (ref 39.0–52.0)
Hemoglobin: 7 g/dL — ABNORMAL LOW (ref 13.0–17.0)
MCH: 32 pg (ref 26.0–34.0)
MCHC: 33.7 g/dL (ref 30.0–36.0)
MCV: 95 fL (ref 80.0–100.0)
Platelets: 254 10*3/uL (ref 150–400)
RBC: 2.19 MIL/uL — ABNORMAL LOW (ref 4.22–5.81)
RDW: 24 % — ABNORMAL HIGH (ref 11.5–15.5)
WBC: 15.5 10*3/uL — ABNORMAL HIGH (ref 4.0–10.5)
nRBC: 0.6 % — ABNORMAL HIGH (ref 0.0–0.2)

## 2019-08-29 LAB — CREATININE, SERUM
Creatinine, Ser: 2.51 mg/dL — ABNORMAL HIGH (ref 0.61–1.24)
GFR calc Af Amer: 37 mL/min — ABNORMAL LOW (ref >60)
GFR calc non Af Amer: 32 mL/min — ABNORMAL LOW (ref >60)

## 2019-08-29 LAB — BASIC METABOLIC PANEL
Anion gap: 12 (ref 5–15)
BUN: 41 mg/dL — ABNORMAL HIGH (ref 6–20)
CO2: 16 mmol/L — ABNORMAL LOW (ref 22–32)
Calcium: 9.2 mg/dL (ref 8.9–10.3)
Chloride: 108 mmol/L (ref 98–111)
Creatinine, Ser: 2.5 mg/dL — ABNORMAL HIGH (ref 0.61–1.24)
GFR calc Af Amer: 37 mL/min — ABNORMAL LOW (ref >60)
GFR calc non Af Amer: 32 mL/min — ABNORMAL LOW (ref >60)
Glucose, Bld: 98 mg/dL (ref 70–99)
Potassium: 5.3 mmol/L — ABNORMAL HIGH (ref 3.5–5.1)
Sodium: 136 mmol/L (ref 135–145)

## 2019-08-29 LAB — POTASSIUM: Potassium: 6 mmol/L — ABNORMAL HIGH (ref 3.5–5.1)

## 2019-08-29 MED ORDER — SODIUM CHLORIDE 0.45 % IV SOLN: INTRAVENOUS | Status: DC

## 2019-08-29 NOTE — Care Management Important Message (Signed)
Important Message  Patient Details  IM Letter given given to Marney Doctor RN Case Manager to present to the Patient Name: [name] [name]. MRN: [id number] Date of Birth: 1980/09/30   Medicare Important Message Given:  Yes     Kerin Salen 08/29/2019, 11:18 AM

## 2019-08-29 NOTE — Progress Notes (Addendum)
Subjective: [name] [name] is a 39 year old male with a medical history significant for sickle cell disease, chronic pain syndrome, opiate dependence and tolerance, history of anemia of chronic disease, history of polysubstance abuse, and history of CKD stage IV was admitted for sickle cell pain crisis.  Patient says that pain has improved some overnight.  He has no new complaints today.  He rates pain as 6/10.  He denies any headache, chest pain, urinary symptoms, nausea, vomiting, or diarrhea.  Objective:  Vital signs in last 24 hours:  Vitals:   08/29/19 1035 08/29/19 1130 08/29/19 1418 08/29/19 1533  BP: (!) 92/52  109/71   Pulse: 96  (!) 108   Resp:  18 16 17   Temp:   98.3 F (36.8 C)   TempSrc:   Oral   SpO2:   92%   Weight:      Height:        Intake/Output from previous day:   Intake/Output Summary (Last 24 hours) at 08/29/2019 1710 Last data filed at 08/29/2019 0600 Gross per 24 hour  Intake 240 ml  Output 900 ml  Net -660 ml    Physical Exam: General: Alert, awake, oriented x3, in no acute distress.  HEENT: [place]/AT PEERL, EOMI Neck: Trachea midline,  no masses, no thyromegal,y no JVD, no carotid bruit OROPHARYNX:  Moist, No exudate/ erythema/lesions.  Heart: Regular rate and rhythm, without murmurs, rubs, gallops, PMI non-displaced, no heaves or thrills on palpation.  Lungs: Clear to auscultation, no wheezing or rhonchi noted. No increased vocal fremitus resonant to percussion  Abdomen: Soft, nontender, nondistended, positive bowel sounds, no masses no hepatosplenomegaly noted..  Neuro: No focal neurological deficits noted cranial nerves II through XII grossly intact. DTRs 2+ bilaterally upper and lower extremities. Strength 5 out of 5 in bilateral upper and lower extremities. Musculoskeletal: No warm swelling or erythema around joints, no spinal tenderness noted. Psychiatric: Patient alert and oriented x3, good insight and cognition, good recent to remote  recall. Lymph node survey: No cervical axillary or inguinal lymphadenopathy noted.  Lab Results:  Basic Metabolic Panel:    Component Value Date/Time   NA 136 08/29/2019 1414   NA 138 06/03/2019 1423   K 5.3 (H) 08/29/2019 1414   CL 108 08/29/2019 1414   CO2 16 (L) 08/29/2019 1414   BUN 41 (H) 08/29/2019 1414   BUN 31 (H) 06/03/2019 1423   CREATININE 2.50 (H) 08/29/2019 1414   CREATININE 1.45 (H) 05/12/2014 1430   GLUCOSE 98 08/29/2019 1414   CALCIUM 9.2 08/29/2019 1414   CBC:    Component Value Date/Time   WBC 15.5 (H) 08/29/2019 0546   HGB 7.0 (L) 08/29/2019 0546   HGB 6.1 (LL) 08/12/2019 0912   HCT 20.8 (L) 08/29/2019 0546   HCT 18.2 (L) 08/12/2019 0912   PLT 254 08/29/2019 0546   PLT 331 08/12/2019 0912   MCV 95.0 08/29/2019 0546   MCV 92 08/12/2019 0912   NEUTROABS 12.8 (H) 08/25/2019 0853   NEUTROABS 13.8 (H) 08/12/2019 0912   LYMPHSABS 4.9 (H) 08/25/2019 0853   LYMPHSABS 3.8 (H) 08/12/2019 0912   MONOABS 1.4 (H) 08/25/2019 0853   EOSABS 0.2 08/25/2019 0853   EOSABS 0.4 08/12/2019 0912   BASOSABS 0.0 08/25/2019 0853   BASOSABS 0.0 08/12/2019 0912    Recent Results (from the past 240 hour(s))  SARS Coronavirus 2 by RT PCR (hospital order, performed in [place] hospital lab) Nasopharyngeal Nasopharyngeal Swab     Status: None   Collection Time:  08/25/19 11:37 AM   Specimen: Nasopharyngeal Swab  Result Value Ref Range Status   SARS Coronavirus 2 NEGATIVE NEGATIVE Final    Comment: (NOTE) SARS-CoV-2 target nucleic acids are NOT DETECTED.  The SARS-CoV-2 RNA is generally detectable in upper and lower respiratory specimens during the acute phase of infection. The lowest concentration of SARS-CoV-2 viral copies this assay can detect is 250 copies / mL. A negative result does not preclude SARS-CoV-2 infection and should not be used as the sole basis for treatment or other patient management decisions.  A negative result may occur with improper specimen  collection / handling, submission of specimen other than nasopharyngeal swab, presence of viral mutation(s) within the areas targeted by this assay, and inadequate number of viral copies (<250 copies / mL). A negative result must be combined with clinical observations, patient history, and epidemiological information.  Fact Sheet for Patients:   StrictlyIdeas.no  Fact Sheet for Healthcare Providers: BankingDealers.co.za  This test is not yet approved or  cleared by the [place] FDA and has been authorized for detection and/or diagnosis of SARS-CoV-2 by FDA under an Emergency Use Authorization (EUA).  This EUA will remain in effect (meaning this test can be used) for the duration of the COVID-19 declaration under Section 564(b)(1) of the Act, 21 U.S.C. section 360bbb-3(b)(1), unless the authorization is terminated or revoked sooner.  Performed at [facility], [place] [address] [place], [place] [address]     Studies/Results: No results found.  Medications: Scheduled Meds: . cholecalciferol  4,000 Units Oral Daily  . folic acid  1 mg Oral Daily  . HYDROmorphone   Intravenous Q4H  . nortriptyline  25 mg Oral BID  . oxyCODONE  15 mg Oral Q12H  . senna-docusate  1 tablet Oral BID  . sodium bicarbonate  650 mg Oral BID   Continuous Infusions: . sodium chloride 150 mL/hr at 08/29/19 1353   PRN Meds:.naloxone **AND** sodium chloride flush, oxyCODONE, polyethylene glycol  Consultants:  None  Procedures:  None  Antibiotics:  None  Assessment/Plan: Principal Problem:   Sickle cell pain crisis ([place]) Active Problems:   CKD (chronic kidney disease), stage IV (HCC)   Systolic murmur   Acute-on-chronic kidney injury (HCC)   Hyperkalemia   Leukocytosis   Chronic pain syndrome   Renal insufficiency  Sickle cell disease with pain crisis: Continue to wean IV Dilaudid PCA IV fluids, 0.45% saline at 125  mL/h Oxycodone 15 mg every 4 hours as needed for severe breakthrough pain Monitor vital signs closely, reevaluate pain scale regularly, and supplemental oxygen as needed  Chronic pain syndrome: Continue home medications  Sickle cell anemia: Hemoglobin stable at 7.0.  Patient is s/p Aranesp injection.  He receives Aranesp 150 mcg every 2 weeks per hematology.  Repeat CBC in a.m.  Will continue to transfuse conservatively.  Transfuse 1 unit PRBCs if hemoglobin is less than 6.0 g/dL.   Acute kidney injury superimposed on chronic kidney disease stage IV: Creatinine stable at 2.5.  Continue hydration.  Avoid all nephrotoxins.  Repeat BMP in a.m.  Patient to follow-up with nephrology as an outpatient for worsening CKD.  Leukocytosis: Stable.  Trended down.  Patient continues to be afebrile without any signs of infection or inflammation.  Continue to monitor closely.  Hyperkalemia: Potassium elevated at 6.0 on today.  Continue hydration.  BMP in a.m.   Code Status: Full Code Family Communication: N/A Disposition Plan: Not yet ready for discharge  [place]  APRN,  MSN, FNP-C Patient [facility] [place], [place] [id number] [phone]   If 5PM-7AM, please contact night-coverage.  08/29/2019, 5:10 PM  LOS: 3 days

## 2019-08-30 LAB — CBC
HCT: 17.1 % — ABNORMAL LOW (ref 39.0–52.0)
Hemoglobin: 5.8 g/dL — CL (ref 13.0–17.0)
MCH: 32.8 pg (ref 26.0–34.0)
MCHC: 33.9 g/dL (ref 30.0–36.0)
MCV: 96.6 fL (ref 80.0–100.0)
Platelets: 320 10*3/uL (ref 150–400)
RBC: 1.77 MIL/uL — ABNORMAL LOW (ref 4.22–5.81)
RDW: 24 % — ABNORMAL HIGH (ref 11.5–15.5)
WBC: 15.5 10*3/uL — ABNORMAL HIGH (ref 4.0–10.5)
nRBC: 0.8 % — ABNORMAL HIGH (ref 0.0–0.2)

## 2019-08-30 LAB — HEMOGLOBIN AND HEMATOCRIT, BLOOD
HCT: 23.8 % — ABNORMAL LOW (ref 39.0–52.0)
Hemoglobin: 8 g/dL — ABNORMAL LOW (ref 13.0–17.0)

## 2019-08-30 LAB — BASIC METABOLIC PANEL
Anion gap: 10 (ref 5–15)
BUN: 34 mg/dL — ABNORMAL HIGH (ref 6–20)
CO2: 16 mmol/L — ABNORMAL LOW (ref 22–32)
Calcium: 9.1 mg/dL (ref 8.9–10.3)
Chloride: 107 mmol/L (ref 98–111)
Creatinine, Ser: 2.21 mg/dL — ABNORMAL HIGH (ref 0.61–1.24)
GFR calc Af Amer: 43 mL/min — ABNORMAL LOW (ref >60)
GFR calc non Af Amer: 37 mL/min — ABNORMAL LOW (ref >60)
Glucose, Bld: 98 mg/dL (ref 70–99)
Potassium: 4.7 mmol/L (ref 3.5–5.1)
Sodium: 133 mmol/L — ABNORMAL LOW (ref 135–145)

## 2019-08-30 LAB — PREPARE RBC (CROSSMATCH)

## 2019-08-30 MED ORDER — SODIUM CHLORIDE 0.9% IV SOLUTION: Freq: Once | INTRAVENOUS | Status: AC

## 2019-08-30 NOTE — Discharge Summary (Signed)
Physician Discharge Summary  Patient ID: [name] [name]. MRN: [id number] DOB/AGE: [birth year] 39 y.o.  Admit date: 08/25/2019 Discharge date: 08/30/2019  Admission Diagnoses:  Discharge Diagnoses:  Principal Problem:   Sickle cell pain crisis ([place]) Active Problems:   CKD (chronic kidney disease), stage IV (HCC)   Systolic murmur   Acute-on-chronic kidney injury ([place])   Hyperkalemia   Leukocytosis   Chronic pain syndrome   Renal insufficiency   Discharged Condition: good  Hospital Course: Patient admitted with sickle cell painful crisis.  Also has significant sickle cell anemia with drop in hemoglobin down to 5.8.  He was treated with Dilaudid PCA, IV fluids and his home regimen.  He has acute on chronic kidney disease stage IV with some hyperkalemia and leukocytosis.  No Toradol was given due to that.  Patient also was treated with gentle IV fluids.  He was transfused 1 unit of packed red blood cells.  Hemoglobin went to above 7.  He felt better doing better and decided to be discharged home to follow-up with PCP.  Consults: None  Significant Diagnostic Studies: labs: Serial CBCs and CMP checked.  Required transfusion of 1 unit packed red blood cells.  Treatments: IV hydration, analgesia: acetaminophen and Dilaudid and blood transfusion 1 unit packed red blood cells  Discharge Exam: Blood pressure 115/75, pulse (!) 101, temperature 98.5 F (36.9 C), temperature source Oral, resp. rate 15, height 5\' 3"  (1.6 m), weight 56.7 kg, SpO2 96 %. General appearance: alert, cooperative, appears stated age and no distress Head: Normocephalic, without obvious abnormality, atraumatic Neck: no adenopathy, no carotid bruit, no JVD, supple, symmetrical, trachea midline and thyroid not enlarged, symmetric, no tenderness/mass/nodules Back: symmetric, no curvature. ROM normal. No CVA tenderness. Resp: clear to auscultation bilaterally Cardio: regular rate and rhythm, S1, S2 normal, no  murmur, click, rub or gallop GI: soft, non-tender; bowel sounds normal; no masses,  no organomegaly Extremities: extremities normal, atraumatic, no cyanosis or edema Pulses: 2+ and symmetric Skin: Skin color, texture, turgor normal. No rashes or lesions Neurologic: Grossly normal  Disposition: Discharge disposition: 01-Home or Self Care       Discharge Instructions    Diet - low sodium heart healthy   Complete by: As directed    Increase activity slowly   Complete by: As directed      Allergies as of 08/30/2019      Reactions   Nsaids Other (See Comments)   Kidney disease   Morphine And Related Other (See Comments)   "Real bad headaches"       Medication List    TAKE these medications   acetaminophen 500 MG tablet Commonly known as: TYLENOL Take 1 tablet (500 mg total) by mouth every 6 (six) hours as needed. What changed: reasons to take this   folic acid 1 MG tablet Commonly known as: FOLVITE Take 1 tablet (1 mg total) by mouth daily.   gabapentin 300 MG capsule Commonly known as: NEURONTIN Take 1 capsule (300 mg total) by mouth 2 (two) times daily as needed.   nortriptyline 25 MG capsule Commonly known as: PAMELOR Take 1 capsule (25 mg total) by mouth 2 (two) times daily.   Oxycodone HCl 10 MG Tabs Take 1 tablet (10 mg total) by mouth every 6 (six) hours as needed. What changed: reasons to take this   oxyCODONE 15 mg 12 hr tablet Commonly known as: OxyCONTIN Take 1 tablet (15 mg total) by mouth every 12 (twelve) hours. What changed: Another medication with  the same name was changed. Make sure you understand how and when to take each.   sodium bicarbonate 650 MG tablet Take 650 mg by mouth 2 (two) times daily.   Vitamin D3 50 MCG (2000 UT) Tabs Take 100 mg by mouth daily.        SignedBarbette Merino 08/30/2019, 7:56 PM   Time 39 minutes

## 2019-08-30 NOTE — Progress Notes (Signed)
[name] [name]  Discharge home. Discharge and medications instructions provided to patient with a returned understanding.

## 2019-08-30 NOTE — Progress Notes (Signed)
CRITICAL VALUE ALERT  Critical Value: Hgb 5.8  Date & Time Notied:  08/30/19 at 0913  Provider Notified: Jonelle Sidle, MD  Orders Received/Actions taken: awaiting orders

## 2019-09-01 ENCOUNTER — Telehealth: Payer: Self-pay | Admitting: Nurse Practitioner

## 2019-09-01 ENCOUNTER — Telehealth (HOSPITAL_COMMUNITY): Payer: Self-pay | Admitting: General Practice

## 2019-09-01 LAB — BPAM RBC
Blood Product Expiration Date: 202108182359
ISSUE DATE / TIME: 202107171223
Unit Type and Rh: 5100

## 2019-09-01 LAB — TYPE AND SCREEN
ABO/RH(D): O POS
Antibody Screen: NEGATIVE
Unit division: 0

## 2019-09-01 NOTE — Telephone Encounter (Signed)
Patient called, wanted to know when he can come to the [facility] for lab work. Per provider, patient should come in on 09/05/2019. Patient notified, verbalized understanding.

## 2019-09-02 ENCOUNTER — Other Ambulatory Visit: Payer: Self-pay | Admitting: Family Medicine

## 2019-09-02 DIAGNOSIS — D571 Sickle-cell disease without crisis: Secondary | ICD-10-CM

## 2019-09-02 MED ORDER — OXYCODONE HCL 10 MG PO TABS: 10.0000 mg | ORAL_TABLET | Freq: Four times a day (QID) | ORAL | 0 refills | Status: DC | PRN

## 2019-09-02 NOTE — Progress Notes (Signed)
Reviewed PDMP substance reporting system prior to prescribing opiate medications. No inconsistencies noted.  Meds ordered this encounter  Medications   Oxycodone HCl 10 MG TABS    Sig: Take 1 tablet (10 mg total) by mouth every 6 (six) hours as needed.    Dispense:  60 tablet    Refill:  0    Order Specific Question:   Supervising Provider    Answer:   [name], [name] [[id number]]   [name] [name] [name]  APRN, MSN, FNP-C [facility] [address]  [place], [place] [address] [phone]  

## 2019-09-03 ENCOUNTER — Telehealth: Payer: Self-pay | Admitting: Family Medicine

## 2019-09-03 ENCOUNTER — Other Ambulatory Visit: Payer: Self-pay | Admitting: Family Medicine

## 2019-09-03 DIAGNOSIS — G894 Chronic pain syndrome: Secondary | ICD-10-CM

## 2019-09-03 DIAGNOSIS — D571 Sickle-cell disease without crisis: Secondary | ICD-10-CM

## 2019-09-03 MED ORDER — OXYCODONE HCL ER 15 MG PO T12A: 15.0000 mg | EXTENDED_RELEASE_TABLET | Freq: Two times a day (BID) | ORAL | 0 refills | Status: DC

## 2019-09-03 NOTE — Progress Notes (Unsigned)
Reviewed PDMP substance reporting system prior to prescribing opiate medications. No inconsistencies noted.  Meds ordered this encounter  Medications   oxyCODONE (OXYCONTIN) 15 mg 12 hr tablet    Sig: Take 1 tablet (15 mg total) by mouth every 12 (twelve) hours.    Dispense:  60 tablet    Refill:  0    Order Specific Question:   Supervising Provider    Answer:   [name], [name] [[id number]]   [name] Verville Moore Elma Shands  APRN, MSN, FNP-C [facility] [address]  [place], [place] [address] [phone]  

## 2019-09-03 NOTE — Telephone Encounter (Signed)
Done

## 2019-09-04 ENCOUNTER — Telehealth: Payer: Self-pay | Admitting: Family Medicine

## 2019-09-04 ENCOUNTER — Other Ambulatory Visit: Payer: Self-pay | Admitting: Family Medicine

## 2019-09-04 DIAGNOSIS — D571 Sickle-cell disease without crisis: Secondary | ICD-10-CM

## 2019-09-04 DIAGNOSIS — N1832 Chronic kidney disease, stage 3b: Secondary | ICD-10-CM

## 2019-09-04 MED ORDER — OXYCODONE HCL 10 MG PO TABS: 10.0000 mg | ORAL_TABLET | Freq: Four times a day (QID) | ORAL | 0 refills | Status: DC | PRN

## 2019-09-04 NOTE — Progress Notes (Signed)
Called and spoke with Ebony Hail at [facility] and cancelled oxycodone 10mg . Thanks!

## 2019-09-04 NOTE — Progress Notes (Signed)
Medication on back order at [facility] [place], resent to [employer].   Meds ordered this encounter  Medications  . Oxycodone HCl 10 MG TABS    Sig: Take 1 tablet (10 mg total) by mouth every 6 (six) hours as needed.    Dispense:  60 tablet    Refill:  0    Order Specific Question:   Supervising Provider    Answer:   [name] [[id number]]    [name]  APRN, MSN, FNP-C [facility] [address] [place], [place] [address] [phone]

## 2019-09-05 ENCOUNTER — Ambulatory Visit (HOSPITAL_COMMUNITY)
Admission: RE | Admit: 2019-09-05 | Discharge: 2019-09-05 | Disposition: A | Discharge status: Home / Self Care | Payer: Medicare Other | Source: Ambulatory Visit | Attending: Internal Medicine | Admitting: Internal Medicine

## 2019-09-05 ENCOUNTER — Other Ambulatory Visit: Payer: Self-pay

## 2019-09-05 DIAGNOSIS — N1832 Chronic kidney disease, stage 3b: Secondary | ICD-10-CM | POA: Diagnosis not present

## 2019-09-05 DIAGNOSIS — D571 Sickle-cell disease without crisis: Secondary | ICD-10-CM | POA: Diagnosis not present

## 2019-09-05 LAB — COMPREHENSIVE METABOLIC PANEL
ALT: 29 U/L (ref 0–44)
AST: 44 U/L — ABNORMAL HIGH (ref 15–41)
Albumin: 3.7 g/dL (ref 3.5–5.0)
Alkaline Phosphatase: 213 U/L — ABNORMAL HIGH (ref 38–126)
Anion gap: 10 (ref 5–15)
BUN: 35 mg/dL — ABNORMAL HIGH (ref 6–20)
CO2: 18 mmol/L — ABNORMAL LOW (ref 22–32)
Calcium: 8.6 mg/dL — ABNORMAL LOW (ref 8.9–10.3)
Chloride: 110 mmol/L (ref 98–111)
Creatinine, Ser: 2.47 mg/dL — ABNORMAL HIGH (ref 0.61–1.24)
GFR calc Af Amer: 37 mL/min — ABNORMAL LOW (ref >60)
GFR calc non Af Amer: 32 mL/min — ABNORMAL LOW (ref >60)
Glucose, Bld: 157 mg/dL — ABNORMAL HIGH (ref 70–99)
Potassium: 4.2 mmol/L (ref 3.5–5.1)
Sodium: 138 mmol/L (ref 135–145)
Total Bilirubin: 2.1 mg/dL — ABNORMAL HIGH (ref 0.3–1.2)
Total Protein: 7.3 g/dL (ref 6.5–8.1)

## 2019-09-05 LAB — CBC WITH DIFFERENTIAL/PLATELET
Abs Immature Granulocytes: 0.03 10*3/uL (ref 0.00–0.07)
Basophils Absolute: 0.1 10*3/uL (ref 0.0–0.1)
Basophils Relative: 0 %
Eosinophils Absolute: 0.3 10*3/uL (ref 0.0–0.5)
Eosinophils Relative: 2 %
HCT: 25.3 % — ABNORMAL LOW (ref 39.0–52.0)
Hemoglobin: 8.2 g/dL — ABNORMAL LOW (ref 13.0–17.0)
Immature Granulocytes: 0 %
Lymphocytes Relative: 45 %
Lymphs Abs: 6.5 10*3/uL — ABNORMAL HIGH (ref 0.7–4.0)
MCH: 30.5 pg (ref 26.0–34.0)
MCHC: 32.4 g/dL (ref 30.0–36.0)
MCV: 94.1 fL (ref 80.0–100.0)
Monocytes Absolute: 1.1 10*3/uL — ABNORMAL HIGH (ref 0.1–1.0)
Monocytes Relative: 8 %
Neutro Abs: 6.5 10*3/uL (ref 1.7–7.7)
Neutrophils Relative %: 45 %
Platelets: 294 10*3/uL (ref 150–400)
RBC: 2.69 MIL/uL — ABNORMAL LOW (ref 4.22–5.81)
RDW: 23.6 % — ABNORMAL HIGH (ref 11.5–15.5)
WBC: 14.5 10*3/uL — ABNORMAL HIGH (ref 4.0–10.5)
nRBC: 0.3 % — ABNORMAL HIGH (ref 0.0–0.2)

## 2019-09-05 NOTE — Progress Notes (Signed)
CBC and CMP labs were drawn. Tolerated well, vitals taken, discharge instructions given, verbalized understanding. Patient alert, oriented and ambulatory at the time of discharge.

## 2019-09-05 NOTE — Telephone Encounter (Signed)
Done

## 2019-09-15 DIAGNOSIS — N049 Nephrotic syndrome with unspecified morphologic changes: Secondary | ICD-10-CM | POA: Diagnosis not present

## 2019-09-15 DIAGNOSIS — N1832 Chronic kidney disease, stage 3b: Secondary | ICD-10-CM | POA: Diagnosis not present

## 2019-09-15 DIAGNOSIS — E559 Vitamin D deficiency, unspecified: Secondary | ICD-10-CM | POA: Diagnosis not present

## 2019-09-15 DIAGNOSIS — E872 Acidosis: Secondary | ICD-10-CM | POA: Diagnosis not present

## 2019-09-15 DIAGNOSIS — D5700 Hb-SS disease with crisis, unspecified: Secondary | ICD-10-CM | POA: Diagnosis not present

## 2019-09-30 ENCOUNTER — Emergency Department (HOSPITAL_COMMUNITY): Payer: Medicare Other

## 2019-09-30 ENCOUNTER — Encounter (HOSPITAL_COMMUNITY): Payer: Self-pay | Admitting: Emergency Medicine

## 2019-09-30 ENCOUNTER — Other Ambulatory Visit: Payer: Self-pay

## 2019-09-30 ENCOUNTER — Telehealth (HOSPITAL_COMMUNITY): Payer: Self-pay | Admitting: General Practice

## 2019-09-30 DIAGNOSIS — Z20822 Contact with and (suspected) exposure to covid-19: Secondary | ICD-10-CM | POA: Diagnosis present

## 2019-09-30 DIAGNOSIS — D5700 Hb-SS disease with crisis, unspecified: Secondary | ICD-10-CM | POA: Diagnosis present

## 2019-09-30 DIAGNOSIS — R0602 Shortness of breath: Secondary | ICD-10-CM | POA: Diagnosis not present

## 2019-09-30 DIAGNOSIS — N184 Chronic kidney disease, stage 4 (severe): Secondary | ICD-10-CM | POA: Diagnosis present

## 2019-09-30 DIAGNOSIS — F17210 Nicotine dependence, cigarettes, uncomplicated: Secondary | ICD-10-CM | POA: Diagnosis present

## 2019-09-30 DIAGNOSIS — D631 Anemia in chronic kidney disease: Secondary | ICD-10-CM | POA: Diagnosis present

## 2019-09-30 DIAGNOSIS — F1120 Opioid dependence, uncomplicated: Secondary | ICD-10-CM | POA: Diagnosis present

## 2019-09-30 DIAGNOSIS — G43909 Migraine, unspecified, not intractable, without status migrainosus: Secondary | ICD-10-CM | POA: Diagnosis present

## 2019-09-30 DIAGNOSIS — J90 Pleural effusion, not elsewhere classified: Secondary | ICD-10-CM | POA: Diagnosis not present

## 2019-09-30 DIAGNOSIS — E875 Hyperkalemia: Secondary | ICD-10-CM | POA: Diagnosis not present

## 2019-09-30 DIAGNOSIS — Z803 Family history of malignant neoplasm of breast: Secondary | ICD-10-CM

## 2019-09-30 DIAGNOSIS — D638 Anemia in other chronic diseases classified elsewhere: Secondary | ICD-10-CM | POA: Diagnosis present

## 2019-09-30 DIAGNOSIS — G894 Chronic pain syndrome: Secondary | ICD-10-CM | POA: Diagnosis present

## 2019-09-30 DIAGNOSIS — E872 Acidosis: Secondary | ICD-10-CM | POA: Diagnosis present

## 2019-09-30 DIAGNOSIS — D649 Anemia, unspecified: Secondary | ICD-10-CM | POA: Diagnosis not present

## 2019-09-30 DIAGNOSIS — Z79899 Other long term (current) drug therapy: Secondary | ICD-10-CM

## 2019-09-30 DIAGNOSIS — N179 Acute kidney failure, unspecified: Principal | ICD-10-CM | POA: Diagnosis present

## 2019-09-30 DIAGNOSIS — D72829 Elevated white blood cell count, unspecified: Secondary | ICD-10-CM | POA: Diagnosis present

## 2019-09-30 MED ORDER — HYDROMORPHONE HCL 1 MG/ML IJ SOLN
0.5000 mg | Freq: Once | INTRAMUSCULAR | Status: AC
  Administered 2019-09-30: 0.5 mg via SUBCUTANEOUS
  Filled 2019-09-30: qty 1

## 2019-09-30 NOTE — ED Notes (Signed)
Pt declined vitals at 1459.

## 2019-09-30 NOTE — ED Notes (Signed)
Patient request lab draw with an IV start.

## 2019-09-30 NOTE — ED Notes (Signed)
Refused vital signs

## 2019-09-30 NOTE — Telephone Encounter (Signed)
Patient called from [facility] requesting to come to the day hospital for pain control. Patient was informed the [facility] does not have the capacity to treat the patient today. The day hospital is full. Patient verbalized understanding, will be remaining at the ER.

## 2019-10-01 ENCOUNTER — Encounter (HOSPITAL_COMMUNITY): Payer: Self-pay | Admitting: Internal Medicine

## 2019-10-01 ENCOUNTER — Inpatient Hospital Stay (HOSPITAL_COMMUNITY)
Admission: EM | Admit: 2019-10-01 | Discharge: 2019-10-04 | DRG: 682 | Disposition: A | Discharge status: Home / Self Care | Payer: Medicare Other | Attending: Internal Medicine | Admitting: Internal Medicine

## 2019-10-01 DIAGNOSIS — F17210 Nicotine dependence, cigarettes, uncomplicated: Secondary | ICD-10-CM | POA: Diagnosis present

## 2019-10-01 DIAGNOSIS — Z803 Family history of malignant neoplasm of breast: Secondary | ICD-10-CM | POA: Diagnosis not present

## 2019-10-01 DIAGNOSIS — N184 Chronic kidney disease, stage 4 (severe): Secondary | ICD-10-CM | POA: Diagnosis present

## 2019-10-01 DIAGNOSIS — D638 Anemia in other chronic diseases classified elsewhere: Secondary | ICD-10-CM | POA: Diagnosis present

## 2019-10-01 DIAGNOSIS — D631 Anemia in chronic kidney disease: Secondary | ICD-10-CM | POA: Diagnosis present

## 2019-10-01 DIAGNOSIS — N179 Acute kidney failure, unspecified: Secondary | ICD-10-CM

## 2019-10-01 DIAGNOSIS — F1120 Opioid dependence, uncomplicated: Secondary | ICD-10-CM | POA: Diagnosis present

## 2019-10-01 DIAGNOSIS — E872 Acidosis, unspecified: Secondary | ICD-10-CM

## 2019-10-01 DIAGNOSIS — N189 Chronic kidney disease, unspecified: Secondary | ICD-10-CM | POA: Diagnosis present

## 2019-10-01 DIAGNOSIS — D72829 Elevated white blood cell count, unspecified: Secondary | ICD-10-CM | POA: Diagnosis present

## 2019-10-01 DIAGNOSIS — G43909 Migraine, unspecified, not intractable, without status migrainosus: Secondary | ICD-10-CM | POA: Diagnosis present

## 2019-10-01 DIAGNOSIS — D5700 Hb-SS disease with crisis, unspecified: Secondary | ICD-10-CM | POA: Diagnosis present

## 2019-10-01 DIAGNOSIS — E875 Hyperkalemia: Secondary | ICD-10-CM | POA: Diagnosis not present

## 2019-10-01 DIAGNOSIS — Z79899 Other long term (current) drug therapy: Secondary | ICD-10-CM | POA: Diagnosis not present

## 2019-10-01 DIAGNOSIS — Z20822 Contact with and (suspected) exposure to covid-19: Secondary | ICD-10-CM | POA: Diagnosis present

## 2019-10-01 DIAGNOSIS — G894 Chronic pain syndrome: Secondary | ICD-10-CM | POA: Diagnosis present

## 2019-10-01 LAB — RETICULOCYTES
Immature Retic Fract: 22.9 % — ABNORMAL HIGH (ref 2.3–15.9)
RBC.: 2.15 MIL/uL — ABNORMAL LOW (ref 4.22–5.81)
Retic Count, Absolute: 407.5 10*3/uL — ABNORMAL HIGH (ref 19.0–186.0)
Retic Ct Pct: 19 % — ABNORMAL HIGH (ref 0.4–3.1)

## 2019-10-01 LAB — CBC WITH DIFFERENTIAL/PLATELET
Abs Immature Granulocytes: 0.14 10*3/uL — ABNORMAL HIGH (ref 0.00–0.07)
Basophils Absolute: 0 10*3/uL (ref 0.0–0.1)
Basophils Relative: 0 %
Eosinophils Absolute: 0.1 10*3/uL (ref 0.0–0.5)
Eosinophils Relative: 1 %
HCT: 19.9 % — ABNORMAL LOW (ref 39.0–52.0)
Hemoglobin: 6.7 g/dL — CL (ref 13.0–17.0)
Immature Granulocytes: 1 %
Lymphocytes Relative: 40 %
Lymphs Abs: 9.1 10*3/uL — ABNORMAL HIGH (ref 0.7–4.0)
MCH: 33 pg (ref 26.0–34.0)
MCHC: 33.7 g/dL (ref 30.0–36.0)
MCV: 98 fL (ref 80.0–100.0)
Monocytes Absolute: 1.7 10*3/uL — ABNORMAL HIGH (ref 0.1–1.0)
Monocytes Relative: 8 %
Neutro Abs: 11.8 10*3/uL — ABNORMAL HIGH (ref 1.7–7.7)
Neutrophils Relative %: 50 %
Platelets: 166 10*3/uL (ref 150–400)
RBC: 2.03 MIL/uL — ABNORMAL LOW (ref 4.22–5.81)
RDW: 26.3 % — ABNORMAL HIGH (ref 11.5–15.5)
WBC: 22.9 10*3/uL — ABNORMAL HIGH (ref 4.0–10.5)
nRBC: 0.3 % — ABNORMAL HIGH (ref 0.0–0.2)

## 2019-10-01 LAB — COMPREHENSIVE METABOLIC PANEL
ALT: 43 U/L (ref 0–44)
AST: 74 U/L — ABNORMAL HIGH (ref 15–41)
Albumin: 4.4 g/dL (ref 3.5–5.0)
Alkaline Phosphatase: 290 U/L — ABNORMAL HIGH (ref 38–126)
Anion gap: 14 (ref 5–15)
BUN: 48 mg/dL — ABNORMAL HIGH (ref 6–20)
CO2: 12 mmol/L — ABNORMAL LOW (ref 22–32)
Calcium: 9.4 mg/dL (ref 8.9–10.3)
Chloride: 108 mmol/L (ref 98–111)
Creatinine, Ser: 3.21 mg/dL — ABNORMAL HIGH (ref 0.61–1.24)
GFR calc Af Amer: 27 mL/min — ABNORMAL LOW (ref >60)
GFR calc non Af Amer: 24 mL/min — ABNORMAL LOW (ref >60)
Glucose, Bld: 82 mg/dL (ref 70–99)
Potassium: 5.3 mmol/L — ABNORMAL HIGH (ref 3.5–5.1)
Sodium: 134 mmol/L — ABNORMAL LOW (ref 135–145)
Total Bilirubin: 3 mg/dL — ABNORMAL HIGH (ref 0.3–1.2)
Total Protein: 8.4 g/dL — ABNORMAL HIGH (ref 6.5–8.1)

## 2019-10-01 LAB — BASIC METABOLIC PANEL
Anion gap: 14 (ref 5–15)
BUN: 47 mg/dL — ABNORMAL HIGH (ref 6–20)
CO2: 17 mmol/L — ABNORMAL LOW (ref 22–32)
Calcium: 9.1 mg/dL (ref 8.9–10.3)
Chloride: 105 mmol/L (ref 98–111)
Creatinine, Ser: 3.13 mg/dL — ABNORMAL HIGH (ref 0.61–1.24)
GFR calc Af Amer: 28 mL/min — ABNORMAL LOW (ref >60)
GFR calc non Af Amer: 24 mL/min — ABNORMAL LOW (ref >60)
Glucose, Bld: 99 mg/dL (ref 70–99)
Potassium: 5.5 mmol/L — ABNORMAL HIGH (ref 3.5–5.1)
Sodium: 136 mmol/L (ref 135–145)

## 2019-10-01 LAB — RAPID URINE DRUG SCREEN, HOSP PERFORMED
Amphetamines: NOT DETECTED
Barbiturates: NOT DETECTED
Benzodiazepines: NOT DETECTED
Cocaine: NOT DETECTED
Opiates: POSITIVE — AB
Tetrahydrocannabinol: POSITIVE — AB

## 2019-10-01 LAB — SARS CORONAVIRUS 2 BY RT PCR (HOSPITAL ORDER, PERFORMED IN ~~LOC~~ HOSPITAL LAB): SARS Coronavirus 2: NEGATIVE

## 2019-10-01 MED ORDER — STERILE WATER FOR INJECTION IV SOLN
INTRAVENOUS | Status: DC
  Filled 2019-10-01: qty 150
  Filled 2019-10-01: qty 850

## 2019-10-01 MED ORDER — GABAPENTIN 300 MG PO CAPS
300.0000 mg | ORAL_CAPSULE | Freq: Two times a day (BID) | ORAL | Status: DC
  Administered 2019-10-01: 300 mg via ORAL
  Filled 2019-10-01 (×7): qty 1

## 2019-10-01 MED ORDER — ACETAMINOPHEN 325 MG PO TABS: 650.0000 mg | ORAL_TABLET | Freq: Four times a day (QID) | ORAL | Status: DC | PRN

## 2019-10-01 MED ORDER — HYDROMORPHONE HCL 2 MG/ML IJ SOLN
2.0000 mg | INTRAMUSCULAR | Status: AC
  Administered 2019-10-01: 2 mg via INTRAVENOUS
  Filled 2019-10-01: qty 1

## 2019-10-01 MED ORDER — ACETAMINOPHEN 500 MG PO TABS: 500.0000 mg | ORAL_TABLET | Freq: Four times a day (QID) | ORAL | Status: DC | PRN

## 2019-10-01 MED ORDER — SODIUM BICARBONATE 650 MG PO TABS
650.0000 mg | ORAL_TABLET | Freq: Two times a day (BID) | ORAL | Status: DC
  Administered 2019-10-01 – 2019-10-04 (×7): 650 mg via ORAL
  Filled 2019-10-01 (×8): qty 1

## 2019-10-01 MED ORDER — SENNOSIDES-DOCUSATE SODIUM 8.6-50 MG PO TABS
1.0000 | ORAL_TABLET | Freq: Two times a day (BID) | ORAL | Status: DC
  Administered 2019-10-01 – 2019-10-02 (×2): 1 via ORAL
  Filled 2019-10-01 (×6): qty 1

## 2019-10-01 MED ORDER — POLYETHYLENE GLYCOL 3350 17 G PO PACK: 17.0000 g | PACK | Freq: Every day | ORAL | Status: DC | PRN

## 2019-10-01 MED ORDER — SODIUM CHLORIDE 0.9% FLUSH: 9.0000 mL | INTRAVENOUS | Status: DC | PRN

## 2019-10-01 MED ORDER — HYDROMORPHONE HCL 1 MG/ML IJ SOLN
0.5000 mg | INTRAMUSCULAR | Status: DC | PRN
  Administered 2019-10-01 (×3): 0.5 mg via INTRAVENOUS
  Filled 2019-10-01: qty 1
  Filled 2019-10-01: qty 0.5
  Filled 2019-10-01: qty 1

## 2019-10-01 MED ORDER — OXYCODONE HCL ER 15 MG PO T12A
15.0000 mg | EXTENDED_RELEASE_TABLET | Freq: Two times a day (BID) | ORAL | Status: DC
  Administered 2019-10-01 – 2019-10-04 (×7): 15 mg via ORAL
  Filled 2019-10-01 (×7): qty 1

## 2019-10-01 MED ORDER — HEPARIN SODIUM (PORCINE) 5000 UNIT/ML IJ SOLN
5000.0000 [IU] | Freq: Three times a day (TID) | INTRAMUSCULAR | Status: DC
  Administered 2019-10-02 – 2019-10-04 (×6): 5000 [IU] via SUBCUTANEOUS
  Filled 2019-10-01 (×8): qty 1

## 2019-10-01 MED ORDER — OXYCODONE HCL 5 MG PO TABS
10.0000 mg | ORAL_TABLET | Freq: Four times a day (QID) | ORAL | Status: DC | PRN
  Administered 2019-10-02 – 2019-10-03 (×2): 10 mg via ORAL
  Filled 2019-10-01 (×2): qty 2

## 2019-10-01 MED ORDER — HYDROMORPHONE 1 MG/ML IV SOLN
INTRAVENOUS | Status: DC
  Administered 2019-10-01: 30 mg via INTRAVENOUS
  Administered 2019-10-01: 3 mg via INTRAVENOUS
  Administered 2019-10-01: 2.7 mg via INTRAVENOUS
  Administered 2019-10-02: 1 mg via INTRAVENOUS
  Administered 2019-10-02 (×2): 1.5 mg via INTRAVENOUS
  Administered 2019-10-02: 3 mg via INTRAVENOUS
  Administered 2019-10-02 (×2): 4 mg via INTRAVENOUS
  Administered 2019-10-03: 3.5 mg via INTRAVENOUS
  Administered 2019-10-03: 0.5 mg via INTRAVENOUS
  Administered 2019-10-03: 2.5 mg via INTRAVENOUS
  Administered 2019-10-03: 30 mg via INTRAVENOUS
  Administered 2019-10-03: 1.5 mg via INTRAVENOUS
  Filled 2019-10-01 (×2): qty 30

## 2019-10-01 MED ORDER — DIPHENHYDRAMINE HCL 25 MG PO CAPS
25.0000 mg | ORAL_CAPSULE | ORAL | Status: DC | PRN
  Administered 2019-10-01: 25 mg via ORAL
  Administered 2019-10-03: 50 mg via ORAL
  Filled 2019-10-01: qty 1
  Filled 2019-10-01: qty 2

## 2019-10-01 MED ORDER — FOLIC ACID 1 MG PO TABS
1.0000 mg | ORAL_TABLET | Freq: Every day | ORAL | Status: DC
  Administered 2019-10-01 – 2019-10-04 (×4): 1 mg via ORAL
  Filled 2019-10-01 (×4): qty 1

## 2019-10-01 MED ORDER — NALOXONE HCL 0.4 MG/ML IJ SOLN: 0.4000 mg | INTRAMUSCULAR | Status: DC | PRN

## 2019-10-01 MED ORDER — NORTRIPTYLINE HCL 25 MG PO CAPS
25.0000 mg | ORAL_CAPSULE | Freq: Two times a day (BID) | ORAL | Status: DC
  Administered 2019-10-01 – 2019-10-04 (×7): 25 mg via ORAL
  Filled 2019-10-01 (×8): qty 1

## 2019-10-01 MED ORDER — SODIUM CHLORIDE 0.45 % IV SOLN: INTRAVENOUS | Status: DC
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
## 2019-10-01 NOTE — ED Notes (Signed)
I have just given report to Piedra Aguza, RN on [address]. We will transport shortly.

## 2019-10-01 NOTE — H&P (Signed)
History and Physical    [name] A [employer] ZOX:[id number] DOB: 03-Feb-1981 DOA: 10/01/2019  PCP: Quentin Angst, MD  Patient coming from: Home  I have personally briefly reviewed patient's old medical records in [facility]  Chief Complaint: Sickle cell pain crisis  HPI: [name] [name]. is a 39 y.o. male with medical history significant of CKD stage 4, HGB SS dz.  Pt presents to ED with 1 day h/o BUE and BLE pain due to sickle cell crisis.  Is seeing nephrology, knows that renal function has been going down hill.   ED Course: Bicarb 12 today.  Creat 3.2 up from 2.4 last month.  HGB 6.7 with sickle cells noted on smear.  Has been taking PO bicarb every day he says, as prescribed.   Review of Systems: As per HPI, otherwise all review of systems negative.  Past Medical History:  Diagnosis Date  . Abscess of right iliac fossa 09/24/2018   required Perc Drain 09/24/2018  . Arachnoid Cyst of brain bilaterally    "2 really small ones in the back of my head; inside; saw them w/MRI" (09/25/2012)  . Bacterial pneumonia ~ 2012   "caught it here in the hospital" (09/25/2012)  . Chronic kidney disease    "from my sickle cell" (09/25/2012)  . CKD (chronic kidney disease), stage II   . Colitis 04/19/2017   CT scan abd/ pelvis  . GERD (gastroesophageal reflux disease)    "after I eat alot of spicey foods" (09/25/2012)  . Gynecomastia, male 07/10/2012  . History of blood transfusion    "always related to sickle cell crisis" (09/25/2012)  . 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   Noted in noted from H[facility]ology notes at [facility]  . Migraines    "take RX qd to prevent them" (09/25/2012)  . Opioid dependence with withdrawal (HCC) 08/30/2012  . Renal insufficiency   . Sickle cell anemia (HCC)   . Sickle cell crisis (HCC) 09/25/2012  . Sickle cell nephropathy (HCC) 07/10/2012  . Sinus tachycardia   . Tachycardia with heart rate 121-140 beats per minute with  ambulation 08/04/2016    Past Surgical History:  Procedure Laterality Date  . ABCESS DRAINAGE    . CHOLECYSTECTOMY  ~ 2012  . COLONOSCOPY N/A 04/23/2017   Procedure: COLONOSCOPY;  Surgeon: Hilarie Fredrickson, MD;  Location: [facility];  Service: Endoscopy;  Laterality: N/A;  . IR FLUORO GUIDE CV LINE RIGHT  12/17/2016  . IR RADIOLOGIST EVAL & MGMT  10/02/2018  . IR RADIOLOGIST EVAL & MGMT  10/15/2018  . IR REMOVAL TUN CV CATH W/O FL  12/21/2016  . IR US GUIDE VASC ACCESS RIGHT  12/17/2016  . spleenectomy       reports that he has been smoking cigarettes. He has been smoking about 0.10 packs per day. He has never used smokeless tobacco. He reports current alcohol use. He reports current drug use. Drug: Marijuana.  Allergies  Allergen Reactions  . Nsaids Other (See Comments)    Kidney disease  . Morphine And Related Other (See Comments)    "Real bad headaches"     Family History  Problem Relation Age of Onset  . Breast cancer Mother      Prior to Admission medications   Medication Sig Start Date End Date Taking? Authorizing Provider  acetaminophen (TYLENOL) 500 MG tablet Take 1 tablet (500 mg total) by mouth every 6 (six) hours as needed. Patient taking differently: Take 500 mg by mouth every 6 (  six) hours as needed for mild pain.  03/26/19  Yes Massie Maroon, FNP  Cholecalciferol (VITAMIN D3) 50 MCG (2000 UT) TABS Take 100 mg by mouth daily.  03/18/19  Yes [provider]  folic acid (FOLVITE) 1 MG tablet Take 1 tablet (1 mg total) by mouth daily. 03/11/19  Yes Massie Maroon, FNP  nortriptyline (PAMELOR) 25 MG capsule Take 1 capsule (25 mg total) by mouth 2 (two) times daily. 04/15/19  Yes Massie Maroon, FNP  oxyCODONE (OXYCONTIN) 15 mg 12 hr tablet Take 1 tablet (15 mg total) by mouth every 12 (twelve) hours. 09/06/19  Yes Massie Maroon, FNP  Oxycodone HCl 10 MG TABS Take 1 tablet (10 mg total) by mouth every 6 (six) hours as needed. Patient taking differently: Take  10 mg by mouth every 6 (six) hours as needed (breakthrough pain).  09/04/19  Yes Massie Maroon, FNP  sodium bicarbonate 650 MG tablet Take 650 mg by mouth 2 (two) times daily. 11/07/18  Yes [provider]  gabapentin (NEURONTIN) 300 MG capsule Take 1 capsule (300 mg total) by mouth 2 (two) times daily as needed. Patient not taking: Reported on 06/12/2019 05/08/19   Massie Maroon, FNP    Physical Exam: Vitals:   10/01/19 0205 10/01/19 0230 10/01/19 0300 10/01/19 0330  BP: 134/88 134/80 (!) 130/91 126/85  Pulse: 85 88 (!) 103 (!) 101  Resp: 20 19 18 15   Temp:      TempSrc:      SpO2: 100% 99% 100% 100%    Constitutional: NAD, calm, comfortable Eyes: PERRL, lids and conjunctivae normal ENMT: Mucous membranes are moist. Posterior pharynx clear of any exudate or lesions.Normal dentition.  Neck: normal, supple, no masses, no thyromegaly Respiratory: clear to auscultation bilaterally, no wheezing, no crackles. Normal respiratory effort. No accessory muscle use.  Cardiovascular: Regular rate and rhythm, no murmurs / rubs / gallops. No extremity edema. 2+ pedal pulses. No carotid bruits.  Abdomen: no tenderness, no masses palpated. No hepatosplenomegaly. Bowel sounds positive.  Musculoskeletal: no clubbing / cyanosis. No joint deformity upper and lower extremities. Good ROM, no contractures. Normal muscle tone.  Skin: no rashes, lesions, ulcers. No induration Neurologic: CN 2-12 grossly intact. Sensation intact, DTR normal. Strength 5/5 in all 4.  Psychiatric: Normal judgment and insight. Alert and oriented x 3. Normal mood.    Labs on Admission: I have personally reviewed following labs and imaging studies  CBC: Recent Labs  Lab 10/01/19 0222  WBC 22.9*  NEUTROABS 11.8*  HGB 6.7*  HCT 19.9*  MCV 98.0  PLT 166   Basic Metabolic Panel: Recent Labs  Lab 10/01/19 0222  NA 134*  K 5.3*  CL 108  CO2 12*  GLUCOSE 82  BUN 48*  CREATININE 3.21*  CALCIUM 9.4    GFR: CrCl cannot be calculated (Unknown ideal weight.). Liver Function Tests: Recent Labs  Lab 10/01/19 0222  AST 74*  ALT 43  ALKPHOS 290*  BILITOT 3.0*  PROT 8.4*  ALBUMIN 4.4   No results for input(s): LIPASE, AMYLASE in the last 168 hours. No results for input(s): AMMONIA in the last 168 hours. Coagulation Profile: No results for input(s): INR, PROTIME in the last 168 hours. Cardiac Enzymes: No results for input(s): CKTOTAL, CKMB, CKMBINDEX, TROPONINI in the last 168 hours. BNP (last 3 results) No results for input(s): PROBNP in the last 8760 hours. HbA1C: No results for input(s): HGBA1C in the last 72 hours. CBG: No results for input(s):  GLUCAP in the last 168 hours. Lipid Profile: No results for input(s): CHOL, HDL, LDLCALC, TRIG, CHOLHDL, LDLDIRECT in the last 72 hours. Thyroid Function Tests: No results for input(s): TSH, T4TOTAL, FREET4, T3FREE, THYROIDAB in the last 72 hours. Anemia Panel: Recent Labs    10/01/19 0222  RETICCTPCT 19.0*   Urine analysis:    Component Value Date/Time   COLORURINE YELLOW 06/28/2019 0738   APPEARANCEUR CLEAR 06/28/2019 0738   LABSPEC 1.009 06/28/2019 0738   PHURINE 7.0 06/28/2019 0738   GLUCOSEU 50 (A) 06/28/2019 0738   HGBUR NEGATIVE 06/28/2019 0738   HGBUR small 03/10/2010 1445   BILIRUBINUR NEGATIVE 06/28/2019 0738   BILIRUBINUR neg 06/03/2019 1419   KETONESUR NEGATIVE 06/28/2019 0738   PROTEINUR >=300 (A) 06/28/2019 0738   UROBILINOGEN 0.2 06/03/2019 1419   UROBILINOGEN 0.2 05/11/2017 1335   NITRITE NEGATIVE 06/28/2019 0738   LEUKOCYTESUR NEGATIVE 06/28/2019 0738    Radiological Exams on Admission: DG Chest 2 View  Result Date: 09/30/2019 CLINICAL DATA:  Short of breath EXAM: CHEST - 2 VIEW COMPARISON:  08/25/2019 FINDINGS: Cardiac and mediastinal contours normal. Vascularity normal. Lungs clear without infiltrate effusion Skeletal changes of sickle cell anemia with avascular necrosis right humeral head.  IMPRESSION: No active cardiopulmonary disease. Electronically Signed   By: Marlan Palau M.D.   On: 09/30/2019 11:19    EKG: Independently reviewed.  Assessment/Plan Principal Problem:   Acute kidney injury superimposed on chronic kidney disease (HCC) Active Problems:   CKD (chronic kidney disease), stage IV (HCC)   Sickle cell pain crisis (HCC)   Metabolic acidosis, NAG, failure of bicarbonate regeneration   Sickle-cell disease with pain (HCC)    1. AKI on CKD 4 - 1. Deteriorating kidney function 2. Likely will want to get nephro consult 2. Sickle cell pain crisis - 1. With NAG metabolic acidosis, no surprise at all to me that he is in crisis (shift of oxygen disassociation curve due to acidosis going to favor unloading of oxygen from hemoglobin = more deoxy-hemoglobin which in turn is going to increase rate of polymerization). 2. Sickle cell pathway 3. No toradol due to CKD 4. No beds available and they wont run PCA in ED 5. So ordering his PCA as a PRN dilaudid order 6. Cont home oxycontin 3. NAG metabolic acidosis - 1. Cont home PO bicarb 2. Looks like this will need to be adjusted up 3. In the mean time putting him on IV isotonic bicarb at 100cc/hr 4. Repeat BMP ordered for noon  DVT prophylaxis: Heparin Troup Code Status: Full Family Communication: No family in room Disposition Plan: Home after admit Consults called: None Admission status: Admit to inpatient  Severity of Illness: The appropriate patient status for this patient is INPATIENT. Inpatient status is judged to be reasonable and necessary in order to provide the required intensity of service to ensure the patient's safety. The patient's presenting symptoms, physical exam findings, and initial radiographic and laboratory data in the context of their chronic comorbidities is felt to place them at high risk for further clinical deterioration. Furthermore, it is not anticipated that the patient will be medically stable  for discharge from the hospital within 2 midnights of admission. The following factors support the patient status of inpatient.   IP status for treatment of sickle cell pain crisis.   * I certify that at the point of admission it is my clinical judgment that the patient will require inpatient hospital care spanning beyond 2 midnights from the point of admission  due to high intensity of service, high risk for further deterioration and high frequency of surveillance required.*    Avigail Pilling M. DO [facility]  How to contact the [facility] Attending or Consulting provider 7A - 7P or covering provider during after hours 7P -7A, for this patient?  1. Check the care team in [facility] and look for a) attending/consulting [facility] provider listed and b) the [facility] team listed 2. Log into [email or web address]  Amion Physician Scheduling and messaging for groups and whole hospitals  On call and physician scheduling software for group practices, residents, hospitalists and other medical providers for call, clinic, rotation and shift schedules. OnCall [employer] is a hospital-wide system for scheduling doctors and paging doctors on call. EasyPlot is for scientific plotting and data analysis.  [email or web address]  and use [facility]'s universal password to access. If you do not have the password, please contact the hospital operator.  3. Locate the [facility] provider you are looking for under [facility] and page to a number that you can be directly reached. 4. If you still have difficulty reaching the provider, please page the [facility] (Director on Call) for the Hospitalists listed on amion for assistance.  10/01/2019, 4:19 AM

## 2019-10-01 NOTE — ED Notes (Signed)
Date and time results received: 10/01/19 2:46 AM  (use smartphrase ".now" to insert current time)  Test: hemoglobin Critical Value: 6.7  Name of Provider Notified: Christy Gentles, MD  Orders Received? Or Actions Taken?:

## 2019-10-02 DIAGNOSIS — N179 Acute kidney failure, unspecified: Principal | ICD-10-CM

## 2019-10-02 DIAGNOSIS — N189 Chronic kidney disease, unspecified: Secondary | ICD-10-CM

## 2019-10-02 DIAGNOSIS — N184 Chronic kidney disease, stage 4 (severe): Secondary | ICD-10-CM

## 2019-10-02 DIAGNOSIS — D5700 Hb-SS disease with crisis, unspecified: Secondary | ICD-10-CM

## 2019-10-02 DIAGNOSIS — E872 Acidosis: Secondary | ICD-10-CM

## 2019-10-02 LAB — COMPREHENSIVE METABOLIC PANEL
ALT: 36 U/L (ref 0–44)
AST: 63 U/L — ABNORMAL HIGH (ref 15–41)
Albumin: 3.5 g/dL (ref 3.5–5.0)
Alkaline Phosphatase: 242 U/L — ABNORMAL HIGH (ref 38–126)
Anion gap: 13 (ref 5–15)
BUN: 38 mg/dL — ABNORMAL HIGH (ref 6–20)
CO2: 17 mmol/L — ABNORMAL LOW (ref 22–32)
Calcium: 8.7 mg/dL — ABNORMAL LOW (ref 8.9–10.3)
Chloride: 103 mmol/L (ref 98–111)
Creatinine, Ser: 2.56 mg/dL — ABNORMAL HIGH (ref 0.61–1.24)
GFR calc Af Amer: 36 mL/min — ABNORMAL LOW (ref >60)
GFR calc non Af Amer: 31 mL/min — ABNORMAL LOW (ref >60)
Glucose, Bld: 97 mg/dL (ref 70–99)
Potassium: 5 mmol/L (ref 3.5–5.1)
Sodium: 133 mmol/L — ABNORMAL LOW (ref 135–145)
Total Bilirubin: 3.2 mg/dL — ABNORMAL HIGH (ref 0.3–1.2)
Total Protein: 6.8 g/dL (ref 6.5–8.1)

## 2019-10-02 LAB — CBC WITH DIFFERENTIAL/PLATELET
Abs Immature Granulocytes: 0.11 10*3/uL — ABNORMAL HIGH (ref 0.00–0.07)
Basophils Absolute: 0 10*3/uL (ref 0.0–0.1)
Basophils Relative: 0 %
Eosinophils Absolute: 0.3 10*3/uL (ref 0.0–0.5)
Eosinophils Relative: 1 %
HCT: 15.3 % — ABNORMAL LOW (ref 39.0–52.0)
Hemoglobin: 5.3 g/dL — CL (ref 13.0–17.0)
Immature Granulocytes: 1 %
Lymphocytes Relative: 39 %
Lymphs Abs: 8 10*3/uL — ABNORMAL HIGH (ref 0.7–4.0)
MCH: 31.9 pg (ref 26.0–34.0)
MCHC: 34.6 g/dL (ref 30.0–36.0)
MCV: 92.2 fL (ref 80.0–100.0)
Monocytes Absolute: 1.8 10*3/uL — ABNORMAL HIGH (ref 0.1–1.0)
Monocytes Relative: 9 %
Neutro Abs: 10.1 10*3/uL — ABNORMAL HIGH (ref 1.7–7.7)
Neutrophils Relative %: 50 %
Platelets: 151 10*3/uL (ref 150–400)
RBC: 1.66 MIL/uL — ABNORMAL LOW (ref 4.22–5.81)
RDW: 25.2 % — ABNORMAL HIGH (ref 11.5–15.5)
WBC: 20.4 10*3/uL — ABNORMAL HIGH (ref 4.0–10.5)
nRBC: 0.3 % — ABNORMAL HIGH (ref 0.0–0.2)

## 2019-10-02 LAB — PREPARE RBC (CROSSMATCH)

## 2019-10-02 MED ORDER — SODIUM CHLORIDE 0.9% IV SOLUTION: Freq: Once | INTRAVENOUS | Status: AC

## 2019-10-02 MED ORDER — ONDANSETRON HCL 4 MG/2ML IJ SOLN
4.0000 mg | Freq: Four times a day (QID) | INTRAMUSCULAR | Status: DC | PRN
  Administered 2019-10-02: 4 mg via INTRAVENOUS
  Filled 2019-10-02: qty 2

## 2019-10-02 NOTE — Progress Notes (Signed)
Patient ID: [name] Pick., male   DOB: December 27, 1980, 39 y.o.   MRN: [id number] Subjective: [name] [name] is a 39 year old male with a medical history significant for sickle cell disease, chronic pain syndrome, opiate dependence and tolerance, history of anemia of chronic disease, history of polysubstance abuse, and history of CKD stage IV was admitted for sickle cell pain crisis.  Patient claims he is still in much pain, he rates his pain at 8/10 this morning, localized to his lower extremities bilaterally but worse on the right thigh and knee joint.  He thinks his right knee joint is swollen, although objectively no swelling noticed.  There is no redness.  He denies any fever, headache, cough, chest pain, or urinary symptoms.  Patient complains of nausea but no vomiting, diarrhea or constipation.  Objective:  Vital signs in last 24 hours:  Vitals:   10/02/19 0415 10/02/19 0954 10/02/19 0957 10/02/19 1215  BP: 102/62 111/71    Pulse: 96 (!) 109    Resp: 13 15 15 15   Temp: 98.2 F (36.8 C) 98.1 F (36.7 C)    TempSrc: Oral Oral    SpO2: 95% 94% 94% 95%  Weight:      Height:        Intake/Output from previous day:   Intake/Output Summary (Last 24 hours) at 10/02/2019 1449 Last data filed at 10/02/2019 1000 Gross per 24 hour  Intake 400 ml  Output 1550 ml  Net -1150 ml    Physical Exam: General: Alert, awake, oriented x3, in no acute distress.  HEENT: [place]/AT PEERL, EOMI Neck: Trachea midline,  no masses, no thyromegal,y no JVD, no carotid bruit OROPHARYNX:  Moist, No exudate/ erythema/lesions.  Heart: Regular rate and rhythm, without murmurs, rubs, gallops, PMI non-displaced, no heaves or thrills on palpation.  Lungs: Clear to auscultation, no wheezing or rhonchi noted. No increased vocal fremitus resonant to percussion  Abdomen: Soft, nontender, nondistended, positive bowel sounds, no masses no hepatosplenomegaly noted..  Neuro: No focal neurological deficits noted  cranial nerves II through XII grossly intact. DTRs 2+ bilaterally upper and lower extremities. Strength 5 out of 5 in bilateral upper and lower extremities. Musculoskeletal: No warm swelling or erythema around joints, no spinal tenderness noted. Psychiatric: Patient alert and oriented x3, good insight and cognition, good recent to remote recall. Lymph node survey: No cervical axillary or inguinal lymphadenopathy noted.  Lab Results:  Basic Metabolic Panel:    Component Value Date/Time   NA 133 (L) 10/02/2019 1126   NA 138 06/03/2019 1423   K 5.0 10/02/2019 1126   CL 103 10/02/2019 1126   CO2 17 (L) 10/02/2019 1126   BUN 38 (H) 10/02/2019 1126   BUN 31 (H) 06/03/2019 1423   CREATININE 2.56 (H) 10/02/2019 1126   CREATININE 1.45 (H) 05/12/2014 1430   GLUCOSE 97 10/02/2019 1126   CALCIUM 8.7 (L) 10/02/2019 1126   CBC:    Component Value Date/Time   WBC 20.4 (H) 10/02/2019 1326   HGB 5.3 (LL) 10/02/2019 1326   HGB 6.1 (LL) 08/12/2019 0912   HCT 15.3 (L) 10/02/2019 1326   HCT 18.2 (L) 08/12/2019 0912   PLT 151 10/02/2019 1326   PLT 331 08/12/2019 0912   MCV 92.2 10/02/2019 1326   MCV 92 08/12/2019 0912   NEUTROABS 10.1 (H) 10/02/2019 1326   NEUTROABS 13.8 (H) 08/12/2019 0912   LYMPHSABS 8.0 (H) 10/02/2019 1326   LYMPHSABS 3.8 (H) 08/12/2019 0912   MONOABS 1.8 (H) 10/02/2019 1326   EOSABS  0.3 10/02/2019 1326   EOSABS 0.4 08/12/2019 0912   BASOSABS 0.0 10/02/2019 1326   BASOSABS 0.0 08/12/2019 0912    Recent Results (from the past 240 hour(s))  SARS Coronavirus 2 by RT PCR (hospital order, performed in [facility] hospital lab) Nasopharyngeal Nasopharyngeal Swab     Status: None   Collection Time: 10/01/19  3:35 AM   Specimen: Nasopharyngeal Swab  Result Value Ref Range Status   SARS Coronavirus 2 NEGATIVE NEGATIVE Final    Comment: (NOTE) SARS-CoV-2 target nucleic acids are NOT DETECTED.  The SARS-CoV-2 RNA is generally detectable in upper and lower respiratory  specimens during the acute phase of infection. The lowest concentration of SARS-CoV-2 viral copies this assay can detect is 250 copies / mL. A negative result does not preclude SARS-CoV-2 infection and should not be used as the sole basis for treatment or other patient management decisions.  A negative result may occur with improper specimen collection / handling, submission of specimen other than nasopharyngeal swab, presence of viral mutation(s) within the areas targeted by this assay, and inadequate number of viral copies (<250 copies / mL). A negative result must be combined with clinical observations, patient history, and epidemiological information.  Fact Sheet for Patients:   BoilerBrush.com.cy  Fact Sheet for Healthcare Providers: https://pope.com/  This test is not yet approved or  cleared by the [place] FDA and has been authorized for detection and/or diagnosis of SARS-CoV-2 by FDA under an Emergency Use Authorization (EUA).  This EUA will remain in effect (meaning this test can be used) for the duration of the COVID-19 declaration under Section 564(b)(1) of the Act, 21 U.S.C. section 360bbb-3(b)(1), unless the authorization is terminated or revoked sooner.  Performed at [facility], [address], [place], [place] [address]     Studies/Results: No results found.  Medications: Scheduled Meds: . sodium chloride   Intravenous Once  . folic acid  1 mg Oral Daily  . gabapentin  300 mg Oral BID  . heparin  5,000 Units Subcutaneous Q8H  . HYDROmorphone   Intravenous Q4H  . nortriptyline  25 mg Oral BID  . oxyCODONE  15 mg Oral Q12H  . senna-docusate  1 tablet Oral BID  . sodium bicarbonate  650 mg Oral BID   Continuous Infusions: . sodium chloride 75 mL/hr at 10/01/19 1619   PRN Meds:.acetaminophen, diphenhydrAMINE, naloxone **AND** sodium chloride flush, ondansetron (ZOFRAN) IV, oxyCODONE,  polyethylene glycol  Consultants:  None  Procedures:  None  Antibiotics:  None  Assessment/Plan: Principal Problem:   Acute kidney injury superimposed on chronic kidney disease (HCC) Active Problems:   CKD (chronic kidney disease), stage IV (HCC)   Sickle cell pain crisis (HCC)   Metabolic acidosis, NAG, failure of bicarbonate regeneration   Sickle-cell disease with pain (HCC)  1. Hb Sickle Cell Disease with crisis: IVF 0.45% Saline @ 75 mls/hour for gentle hydration, continue weight based Dilaudid PCA, IV Toradol is contraindicated due to CKD, restart and continue home oral pain medication.  Monitor vitals very closely, Re-evaluate pain scale regularly, 2 L of Oxygen by [place]. 2. Sickle Cell Anemia: Hemoglobin has dropped below baseline to 5.3 today, will transfuse patient with 2 units of packed red blood cell.  Repeat labs in a.m.  We will give patient his routine Aranesp injection if indicated during this admission. 3. Chronic pain Syndrome: Restart and continue home pain medications. 4. Acute kidney injury superimposed on chronic kidney disease stage IV: Serum creatinine baseline is  around 2.5, but increased to above 3 on admission.  Gentle hydration, avoid all nephrotoxins, repeat labs in a.m. 5. Leukocytosis: This is chronic, could be multifactorial including dehydration.  Patient has no clinical indication for infection or inflammation, will monitor closely without antibiotics. Generally gentle hydration provides some improvement in this situation. 6. Hyperkalemia: Serum potassium is above 5.5 on admission patient clinically stable.  Will rehydrate and repeat labs in a.m.  Code Status: Full Code Family Communication: N/A Disposition Plan: Not yet ready for discharge  [name] [name]  If 7PM-7AM, please contact night-coverage.  10/02/2019, 2:49 PM  LOS: 1 day

## 2019-10-03 LAB — BPAM RBC
Blood Product Expiration Date: 202109182359
Blood Product Expiration Date: 202109182359
ISSUE DATE / TIME: 202108191754
ISSUE DATE / TIME: 202108192039
Unit Type and Rh: 5100
Unit Type and Rh: 5100

## 2019-10-03 LAB — CBC WITH DIFFERENTIAL/PLATELET
Abs Immature Granulocytes: 0.08 10*3/uL — ABNORMAL HIGH (ref 0.00–0.07)
Basophils Absolute: 0 10*3/uL (ref 0.0–0.1)
Basophils Relative: 0 %
Eosinophils Absolute: 0.3 10*3/uL (ref 0.0–0.5)
Eosinophils Relative: 2 %
HCT: 22.5 % — ABNORMAL LOW (ref 39.0–52.0)
Hemoglobin: 7.8 g/dL — ABNORMAL LOW (ref 13.0–17.0)
Immature Granulocytes: 1 %
Lymphocytes Relative: 49 %
Lymphs Abs: 8.1 10*3/uL — ABNORMAL HIGH (ref 0.7–4.0)
MCH: 31.6 pg (ref 26.0–34.0)
MCHC: 34.7 g/dL (ref 30.0–36.0)
MCV: 91.1 fL (ref 80.0–100.0)
Monocytes Absolute: 1.2 10*3/uL — ABNORMAL HIGH (ref 0.1–1.0)
Monocytes Relative: 7 %
Neutro Abs: 6.8 10*3/uL (ref 1.7–7.7)
Neutrophils Relative %: 41 %
Platelets: 173 10*3/uL (ref 150–400)
RBC: 2.47 MIL/uL — ABNORMAL LOW (ref 4.22–5.81)
RDW: 22.1 % — ABNORMAL HIGH (ref 11.5–15.5)
WBC: 16.5 10*3/uL — ABNORMAL HIGH (ref 4.0–10.5)
nRBC: 0.5 % — ABNORMAL HIGH (ref 0.0–0.2)

## 2019-10-03 LAB — BASIC METABOLIC PANEL
Anion gap: 12 (ref 5–15)
BUN: 33 mg/dL — ABNORMAL HIGH (ref 6–20)
CO2: 16 mmol/L — ABNORMAL LOW (ref 22–32)
Calcium: 8.9 mg/dL (ref 8.9–10.3)
Chloride: 105 mmol/L (ref 98–111)
Creatinine, Ser: 2.46 mg/dL — ABNORMAL HIGH (ref 0.61–1.24)
GFR calc Af Amer: 38 mL/min — ABNORMAL LOW (ref >60)
GFR calc non Af Amer: 32 mL/min — ABNORMAL LOW (ref >60)
Glucose, Bld: 95 mg/dL (ref 70–99)
Potassium: 4.9 mmol/L (ref 3.5–5.1)
Sodium: 133 mmol/L — ABNORMAL LOW (ref 135–145)

## 2019-10-03 LAB — TYPE AND SCREEN
ABO/RH(D): O POS
Antibody Screen: NEGATIVE
Donor AG Type: NEGATIVE
Donor AG Type: NEGATIVE
Unit division: 0
Unit division: 0

## 2019-10-03 NOTE — Plan of Care (Signed)
  Problem: Fluid Volume: Goal: Ability to maintain a balanced intake and output will improve Outcome: Progressing   Problem: Sensory: Goal: Pain level will decrease with appropriate interventions Outcome: Progressing

## 2019-10-03 NOTE — Care Management Important Message (Signed)
Important Message  Patient Details IM Letter given to the Patient Name: [name] [name]. MRN: [id number] Date of Birth: 1980/06/05   Medicare Important Message Given:  Yes     Kerin Salen 10/03/2019, 9:47 AM

## 2019-10-03 NOTE — Progress Notes (Signed)
   10/02/19 2130  Vitals  Temp 98.1 F (36.7 C)  Temp Source Oral  BP (!) 108/37  BP Location Right Arm  BP Method Automatic  Patient Position (if appropriate) Lying  Pulse Rate (!) 110  Pulse Rate Source Monitor  Resp 12  MEWS COLOR  MEWS Score Color Yellow  MEWS Score  MEWS Temp 0  MEWS Systolic 0  MEWS Pulse 1  MEWS RR 1  MEWS LOC 0  MEWS Score 2

## 2019-10-03 NOTE — Progress Notes (Signed)
   10/02/19 2147  Vitals  Pulse Rate 95  Pulse Rate Source Monitor  MEWS COLOR  MEWS Score Color Green  Oxygen Therapy  SpO2 92 %  MEWS Score  MEWS Temp 0  MEWS Systolic 0  MEWS Pulse 0  MEWS RR 1  MEWS LOC 0  MEWS Score 1

## 2019-10-03 NOTE — Progress Notes (Signed)
Patient ID: [name] Pick., male   DOB: [birth year], 39 y.o.   MRN: [id number] Subjective: [name] [name] is a 39 year old male with a medical history significant for sickle cell disease, chronic pain syndrome, opiate dependence and tolerance, history of anemia of chronic disease, history of polysubstance abuse, and history of CKD stage IV was admitted for sickle cell pain crisis.  Patient feels much better today but still having significant pain in his right knee joint, he claims is slightly swollen but is not substantiated on examination.  He is able to bear weight but with pain.  No redness.  He denies any fever, cough, chest pain, shortness of breath, nausea, vomiting or diarrhea.  Objective:  Vital signs in last 24 hours:  Vitals:   10/03/19 0240 10/03/19 0407 10/03/19 0920 10/03/19 1150  BP:  113/67 115/70   Pulse:  (!) 101 (!) 101   Resp: 12 14 18 17   Temp:  98.1 F (36.7 C) 98.4 F (36.9 C)   TempSrc:  Oral Oral   SpO2:  92% 94% 94%  Weight:      Height:       Intake/Output from previous day:   Intake/Output Summary (Last 24 hours) at 10/03/2019 1247 Last data filed at 10/03/2019 1013 Gross per 24 hour  Intake 3306.06 ml  Output 1790 ml  Net 1516.06 ml   Physical Exam: General: Alert, awake, oriented x3, in no acute distress.  HEENT: [place]/AT PEERL, EOMI Neck: Trachea midline,  no masses, no thyromegal,y no JVD, no carotid bruit OROPHARYNX:  Moist, No exudate/ erythema/lesions.  Heart: Regular rate and rhythm, without murmurs, rubs, gallops, PMI non-displaced, no heaves or thrills on palpation.  Lungs: Clear to auscultation, no wheezing or rhonchi noted. No increased vocal fremitus resonant to percussion  Abdomen: Soft, nontender, nondistended, positive bowel sounds, no masses no hepatosplenomegaly noted..  Neuro: No focal neurological deficits noted cranial nerves II through XII grossly intact. DTRs 2+ bilaterally upper and lower extremities. Strength 5 out of 5  in bilateral upper and lower extremities. Musculoskeletal: No warm swelling or erythema around joints, no spinal tenderness noted. Psychiatric: Patient alert and oriented x3, good insight and cognition, good recent to remote recall. Lymph node survey: No cervical axillary or inguinal lymphadenopathy noted.  Lab Results:  Basic Metabolic Panel:    Component Value Date/Time   NA 133 (L) 10/03/2019 0533   NA 138 06/03/2019 1423   K 4.9 10/03/2019 0533   CL 105 10/03/2019 0533   CO2 16 (L) 10/03/2019 0533   BUN 33 (H) 10/03/2019 0533   BUN 31 (H) 06/03/2019 1423   CREATININE 2.46 (H) 10/03/2019 0533   CREATININE 1.45 (H) 05/12/2014 1430   GLUCOSE 95 10/03/2019 0533   CALCIUM 8.9 10/03/2019 0533   CBC:    Component Value Date/Time   WBC 16.5 (H) 10/03/2019 0533   HGB 7.8 (L) 10/03/2019 0533   HGB 6.1 (LL) 08/12/2019 0912   HCT 22.5 (L) 10/03/2019 0533   HCT 18.2 (L) 08/12/2019 0912   PLT 173 10/03/2019 0533   PLT 331 08/12/2019 0912   MCV 91.1 10/03/2019 0533   MCV 92 08/12/2019 0912   NEUTROABS 6.8 10/03/2019 0533   NEUTROABS 13.8 (H) 08/12/2019 0912   LYMPHSABS 8.1 (H) 10/03/2019 0533   LYMPHSABS 3.8 (H) 08/12/2019 0912   MONOABS 1.2 (H) 10/03/2019 0533   EOSABS 0.3 10/03/2019 0533   EOSABS 0.4 08/12/2019 0912   BASOSABS 0.0 10/03/2019 0533   BASOSABS 0.0 08/12/2019 0912  Recent Results (from the past 240 hour(s))  SARS Coronavirus 2 by RT PCR (hospital order, performed in [facility] hospital lab) Nasopharyngeal Nasopharyngeal Swab     Status: None   Collection Time: 10/01/19  3:35 AM   Specimen: Nasopharyngeal Swab  Result Value Ref Range Status   SARS Coronavirus 2 NEGATIVE NEGATIVE Final    Comment: (NOTE) SARS-CoV-2 target nucleic acids are NOT DETECTED.  The SARS-CoV-2 RNA is generally detectable in upper and lower respiratory specimens during the acute phase of infection. The lowest concentration of SARS-CoV-2 viral copies this assay can detect is  250 copies / mL. A negative result does not preclude SARS-CoV-2 infection and should not be used as the sole basis for treatment or other patient management decisions.  A negative result may occur with improper specimen collection / handling, submission of specimen other than nasopharyngeal swab, presence of viral mutation(s) within the areas targeted by this assay, and inadequate number of viral copies (<250 copies / mL). A negative result must be combined with clinical observations, patient history, and epidemiological information.  Fact Sheet for Patients:   BoilerBrush.com.cy  Fact Sheet for Healthcare Providers: https://pope.com/  This test is not yet approved or  cleared by the [place] FDA and has been authorized for detection and/or diagnosis of SARS-CoV-2 by FDA under an Emergency Use Authorization (EUA).  This EUA will remain in effect (meaning this test can be used) for the duration of the COVID-19 declaration under Section 564(b)(1) of the Act, 21 U.S.C. section 360bbb-3(b)(1), unless the authorization is terminated or revoked sooner.  Performed at [facility], [address] [place], [place] [address]     Studies/Results: No results found.  Medications: Scheduled Meds: . folic acid  1 mg Oral Daily  . gabapentin  300 mg Oral BID  . heparin  5,000 Units Subcutaneous Q8H  . HYDROmorphone   Intravenous Q4H  . nortriptyline  25 mg Oral BID  . oxyCODONE  15 mg Oral Q12H  . senna-docusate  1 tablet Oral BID  . sodium bicarbonate  650 mg Oral BID   Continuous Infusions: . sodium chloride 10 mL/hr at 10/03/19 1202   PRN Meds:.acetaminophen, diphenhydrAMINE, naloxone **AND** sodium chloride flush, ondansetron (ZOFRAN) IV, oxyCODONE, polyethylene glycol  Consultants:  None  Procedures:  None  Antibiotics:  None  Assessment/Plan: Principal Problem:   Acute kidney injury  superimposed on chronic kidney disease (HCC) Active Problems:   CKD (chronic kidney disease), stage IV (HCC)   Sickle cell pain crisis (HCC)   Metabolic acidosis   Sickle-cell disease with pain (HCC)  1. Hb Sickle Cell Disease with crisis: Decrease IVF 0.45% Saline to [facility], continue weight based Dilaudid PCA, IV Toradol is contraindicated due to CKD, continue home oral pain medication as ordered.  Monitor vitals very closely, Re-evaluate pain scale regularly, 2 L of Oxygen by [place]. 2. Sickle Cell Anemia: Hemoglobin dropped below baseline to 5.3 yesterday, patient is status post transfusion of 2 units of packed red blood cell with good response in his hemoglobin concentration today, back to baseline of 7.8. Patient is hemodynamically stable. 3. Chronic pain Syndrome: Continue oral home pain medications. 4. Acute kidney injury superimposed on chronic kidney disease stage IV: Improved with gentle hydration, serum creatinine now at baseline of 2.46, continue to avoid all nephrotoxins. Patient will follow up with his nephrologist as scheduled. 5. Leukocytosis: Improved. Patient has no clinical indication for infection or inflammation, will continue to monitor closely without antibiotics. 6. Hyperkalemia:  Resolved. Serum potassium is now within normal limit at 4.9, patient clinically and hemodynamically stable.  Code Status: Full Code Family Communication: N/A Disposition Plan: Not yet ready for discharge  [name] [name]  If 7PM-7AM, please contact night-coverage.  10/03/2019, 12:47 PM  LOS: 2 days

## 2019-10-04 ENCOUNTER — Other Ambulatory Visit: Payer: Self-pay | Admitting: Internal Medicine

## 2019-10-04 DIAGNOSIS — G894 Chronic pain syndrome: Secondary | ICD-10-CM

## 2019-10-04 DIAGNOSIS — D571 Sickle-cell disease without crisis: Secondary | ICD-10-CM

## 2019-10-04 MED ORDER — OXYCODONE HCL 10 MG PO TABS: 10.0000 mg | ORAL_TABLET | Freq: Four times a day (QID) | ORAL | 0 refills | Status: DC | PRN

## 2019-10-04 MED ORDER — OXYCODONE HCL ER 15 MG PO T12A: 15.0000 mg | EXTENDED_RELEASE_TABLET | Freq: Two times a day (BID) | ORAL | 0 refills | Status: DC

## 2019-10-04 NOTE — Discharge Instructions (Signed)
Sickle Cell Anemia, Adult  Sickle cell anemia is a condition where your red blood cells are shaped like sickles. Red blood cells carry oxygen through the body. Sickle-shaped cells do not live as long as normal red blood cells. They also clump together and block blood from flowing through the blood vessels. This prevents the body from getting enough oxygen. Sickle cell anemia causes organ damage and pain. It also increases the risk of infection. Follow these instructions at home: Medicines  Take over-the-counter and prescription medicines only as told by your doctor.  If you were prescribed an antibiotic medicine, take it as told by your doctor. Do not stop taking the antibiotic even if you start to feel better.  If you develop a fever, do not take medicines to lower the fever right away. Tell your doctor about the fever. Managing pain, stiffness, and swelling  Try these methods to help with pain: ? Use a heating pad. ? Take a warm bath. ? Distract yourself, such as by watching TV. Eating and drinking  Drink enough fluid to keep your pee (urine) clear or pale yellow. Drink more in hot weather and during exercise.  Limit or avoid alcohol.  Eat a healthy diet. Eat plenty of fruits, vegetables, whole grains, and lean protein.  Take vitamins and supplements as told by your doctor. Traveling  When traveling, keep these with you: ? Your medical information. ? The names of your doctors. ? Your medicines.  If you need to take an airplane, talk to your doctor first. Activity  Rest often.  Avoid exercises that make your heart beat much faster, such as jogging. General instructions  Do not use products that have nicotine or tobacco, such as cigarettes and e-cigarettes. If you need help quitting, ask your doctor.  Consider wearing a medical alert bracelet.  Avoid being in high places (high altitudes), such as mountains.  Avoid very hot or cold temperatures.  Avoid places where  the temperature changes a lot.  Keep all follow-up visits as told by your doctor. This is important. Contact a doctor if:  A joint hurts.  Your feet or hands hurt or swell.  You feel tired (fatigued). Get help right away if:  You have symptoms of infection. These include: ? Fever. ? Chills. ? Being very tired. ? Irritability. ? Poor eating. ? Throwing up (vomiting).  You feel dizzy or faint.  You have new stomach pain, especially on the left side.  You have a an erection (priapism) that lasts more than 4 hours.  You have numbness in your arms or legs.  You have a hard time moving your arms or legs.  You have trouble talking.  You have pain that does not go away when you take medicine.  You are short of breath.  You are breathing fast.  You have a long-term cough.  You have pain in your chest.  You have a bad headache.  You have a stiff neck.  Your stomach looks bloated even though you did not eat much.  Your skin is pale.  You suddenly cannot see well. Summary  Sickle cell anemia is a condition where your red blood cells are shaped like sickles.  Follow your doctor's advice on ways to manage pain, food to eat, activities to do, and steps to take for safe travel.  Get medical help right away if you have any signs of infection, such as a fever. This information is not intended to replace advice given to you  by your health care provider. Make sure you discuss any questions you have with your health care provider. Document Revised: 05/24/2018 Document Reviewed: 03/07/2016 Elsevier Patient Education  [place]. Chronic Kidney Disease, Adult Chronic kidney disease (CKD) happens when the kidneys are damaged over a long period of time. The kidneys are two organs that help with:  Getting rid of waste and extra fluid from the blood.  Making hormones that maintain the amount of fluid in your tissues and blood vessels.  Making sure that the body has  the right amount of fluids and chemicals. Most of the time, CKD does not go away, but it can usually be controlled. Steps must be taken to slow down the kidney damage or to stop it from getting worse. If this is not done, the kidneys may stop working. Follow these instructions at home: Medicines  Take over-the-counter and prescription medicines only as told by your doctor. You may need to change the amount of medicines you take.  Do not take any new medicines unless your doctor says it is okay. Many medicines can make your kidney damage worse.  Do not take any vitamin and supplements unless your doctor says it is okay. Many vitamins and supplements can make your kidney damage worse. General instructions  Follow a diet as told by your doctor. You may need to stay away from: ? Alcohol. ? Salty foods. ? Foods that are high in:  Potassium.  Calcium.  Protein.  Do not use any products that contain nicotine or tobacco, such as cigarettes and e-cigarettes. If you need help quitting, ask your doctor.  Keep track of your blood pressure at home. Tell your doctor about any changes.  If you have diabetes, keep track of your blood sugar as told by your doctor.  Try to stay at a healthy weight. If you need help, ask your doctor.  Exercise at least 30 minutes a day, 5 days a week.  Stay up-to-date with your shots (immunizations) as told by your doctor.  Keep all follow-up visits as told by your doctor. This is important. Contact a doctor if:  Your symptoms get worse.  You have new symptoms. Get help right away if:  You have symptoms of end-stage kidney disease. These may include: ? Headaches. ? Numbness in your hands or feet. ? Easy bruising. ? Having hiccups often. ? Chest pain. ? Shortness of breath. ? Stopping of menstrual periods in women.  You have a fever.  You have very little pee (urine).  You have pain or bleeding when you pee. Summary  Chronic kidney disease  (CKD) happens when the kidneys are damaged over a long period of time.  Most of the time, this condition does not go away, but it can usually be controlled. Steps must be taken to slow down the kidney damage or to stop it from getting worse.  Treatment may include a combination of medicines and lifestyle changes. This information is not intended to replace advice given to you by your health care provider. Make sure you discuss any questions you have with your health care provider. Document Revised: 01/12/2017 Document Reviewed: 03/06/2016 Elsevier Patient Education  2020 [employer].

## 2019-10-04 NOTE — Discharge Summary (Signed)
Physician Discharge Summary  [name] [name]. YSA:[id number] DOB: 1980-08-14 DOA: 10/01/2019  PCP: [name], MD  Admit date: 10/01/2019  Discharge date: 10/04/2019  Discharge Diagnoses:  Principal Problem:   Acute kidney injury superimposed on chronic kidney disease ([place]) Active Problems:   CKD (chronic kidney disease), stage IV (HCC)   Sickle cell pain crisis ([place])   Metabolic acidosis   Sickle-cell disease with pain ([place])   Discharge Condition: Stable  Disposition:   Follow-up Information    [name], MD. Go in 10 day(s).   Specialty: Internal Medicine Contact information: [place]  [address] [phone]        Buford Dresser, MD .   Specialty: Cardiology Contact information: [address] [place] [address] [phone]              Pt is discharged home in good condition and is to follow up with [name], MD this week to have labs evaluated. [name] is instructed to increase activity slowly and balance with rest for the next few days, and use prescribed medication to complete treatment of pain  Diet: Regular Wt Readings from Last 3 Encounters:  10/01/19 50.8 kg  08/25/19 56.7 kg  08/05/19 52.3 kg    History of present illness:  [name] [name]. is a 39 y.o. male with medical history significant of CKD stage 4, HGB SS dz.  Pt presents to ED with 1 day h/o BUE and BLE pain due to sickle cell crisis.  Is seeing nephrology, knows that renal function has been going down hill.  ED Course: Bicarb 12 today.  Creat 3.2 up from 2.4 last month.  HGB 6.7 with sickle cells noted on smear.  Has been taking PO bicarb every day he says, as prescribed.  Hospital Course:  Patient was admitted for sickle cell pain crisis and acute kidney injury superimposed on CKD stage IV, and he was managed appropriately with gentle hydration with IVF, IV Dilaudid via PCA  and his home pain medications, as well as other adjunct therapies per sickle cell pain management protocols.  Toradol is contraindicated due to CKD.  Hemoglobin dropped to a nadir of 5.4 during this admission for which he received transfusion of 2 units of packed red blood cell with significant improvement to hemoglobin concentration to his baseline of 7.8. Patient's pain slowly improved to the extent that as at today, patient is able to ambulate well with no significant pain, able to bear weight on his lower extremities.  He is tolerating p.o. intake with no restrictions. Patient was therefore discharged home today in a hemodynamically stable condition.   [place] will follow-up with PCP within 1 week of this discharge. [place] was counseled extensively about nonpharmacologic means of pain management, patient verbalized understanding and was appreciative of  the care received during this admission.   We discussed the need for good hydration, monitoring of hydration status, avoidance of heat, cold, stress, and infection triggers. We discussed the need to be adherent with taking his home medications. Patient was reminded of the need to seek medical attention immediately if any symptom of bleeding, anemia, or infection occurs.  Discharge Exam: Vitals:   10/04/19 0650 10/04/19 1107  BP:  106/66  Pulse:  (!) 101  Resp: 13 15  Temp:  98.5 F (36.9 C)  SpO2: 97% 93%   Vitals:   10/04/19 0127 10/04/19 0633 10/04/19 0650 10/04/19 1107  BP:  111/78  106/66  Pulse:  75  (!) 101  Resp: 12 (!) 9 13 15   Temp:  97.7 F (36.5 C)  98.5 F (36.9 C)  TempSrc:  Oral  Oral  SpO2: 93% 95% 97% 93%  Weight:      Height:       General appearance : Awake, alert, not in any distress. Speech Clear. Not toxic looking HEENT: Atraumatic and Normocephalic, pupils equally reactive to light and accomodation Neck: Supple, no JVD. No cervical lymphadenopathy.  Chest: Good air entry bilaterally, no added sounds  CVS: S1  S2 regular, no murmurs.  Abdomen: Bowel sounds present, Non tender and not distended with no gaurding, rigidity or rebound. Extremities: B/L Lower Ext shows no edema, both legs are warm to touch Neurology: Awake alert, and oriented X 3, CN II-XII intact, Non focal Skin: No Rash  Discharge Instructions  Discharge Instructions    Diet - low sodium heart healthy   Complete by: As directed    Increase activity slowly   Complete by: As directed      Allergies as of 10/04/2019      Reactions   Nsaids Other (See Comments)   Kidney disease   Morphine And Related Other (See Comments)   "Real bad headaches"       Medication List    TAKE these medications   acetaminophen 500 MG tablet Commonly known as: TYLENOL Take 1 tablet (500 mg total) by mouth every 6 (six) hours as needed. What changed: reasons to take this   folic acid 1 MG tablet Commonly known as: FOLVITE Take 1 tablet (1 mg total) by mouth daily.   gabapentin 300 MG capsule Commonly known as: NEURONTIN Take 1 capsule (300 mg total) by mouth 2 (two) times daily as needed.   nortriptyline 25 MG capsule Commonly known as: PAMELOR Take 1 capsule (25 mg total) by mouth 2 (two) times daily.   Oxycodone HCl 10 MG Tabs Take 1 tablet (10 mg total) by mouth every 6 (six) hours as needed. What changed: reasons to take this   oxyCODONE 15 mg 12 hr tablet Commonly known as: OxyCONTIN Take 1 tablet (15 mg total) by mouth every 12 (twelve) hours. What changed: Another medication with the same name was changed. Make sure you understand how and when to take each.   sodium bicarbonate 650 MG tablet Take 650 mg by mouth 2 (two) times daily.   Vitamin D3 50 MCG (2000 UT) Tabs Take 100 mg by mouth daily.       The results of significant diagnostics from this hospitalization (including imaging, microbiology, ancillary and laboratory) are listed below for reference.    Significant Diagnostic Studies: DG Chest 2 View  Result  Date: 09/30/2019 CLINICAL DATA:  Short of breath EXAM: CHEST - 2 VIEW COMPARISON:  08/25/2019 FINDINGS: Cardiac and mediastinal contours normal. Vascularity normal. Lungs clear without infiltrate effusion Skeletal changes of sickle cell anemia with avascular necrosis right humeral head. IMPRESSION: No active cardiopulmonary disease. Electronically Signed   By: Franchot Gallo M.D.   On: 09/30/2019 11:19    Microbiology: Recent Results (from the past 240 hour(s))  SARS Coronavirus 2 by RT PCR (hospital order, performed in [facility] hospital lab) Nasopharyngeal Nasopharyngeal Swab     Status: None   Collection Time: 10/01/19  3:35 AM   Specimen: Nasopharyngeal Swab  Result Value Ref Range Status   SARS Coronavirus 2 NEGATIVE NEGATIVE Final    Comment: (NOTE) SARS-CoV-2 target nucleic acids  are NOT DETECTED.  The SARS-CoV-2 RNA is generally detectable in upper and lower respiratory specimens during the acute phase of infection. The lowest concentration of SARS-CoV-2 viral copies this assay can detect is 250 copies / mL. A negative result does not preclude SARS-CoV-2 infection and should not be used as the sole basis for treatment or other patient management decisions.  A negative result may occur with improper specimen collection / handling, submission of specimen other than nasopharyngeal swab, presence of viral mutation(s) within the areas targeted by this assay, and inadequate number of viral copies (<250 copies / mL). A negative result must be combined with clinical observations, patient history, and epidemiological information.  Fact Sheet for Patients:   StrictlyIdeas.no  Fact Sheet for Healthcare Providers: BankingDealers.co.za  This test is not yet approved or  cleared by the [place] FDA and has been authorized for detection and/or diagnosis of SARS-CoV-2 by FDA under an Emergency Use Authorization (EUA).  This EUA will  remain in effect (meaning this test can be used) for the duration of the COVID-19 declaration under Section 564(b)(1) of the Act, 21 U.S.C. section 360bbb-3(b)(1), unless the authorization is terminated or revoked sooner.  Performed at [facility], [place] [address], [place], [place] [address]      Labs: Basic Metabolic Panel: Recent Labs  Lab 10/01/19 0222 10/01/19 0222 10/01/19 1200 10/01/19 1200 10/02/19 1126 10/03/19 0533  NA 134*  --  136  --  133* 133*  K 5.3*   < > 5.5*   < > 5.0 4.9  CL 108  --  105  --  103 105  CO2 12*  --  17*  --  17* 16*  GLUCOSE 82  --  99  --  97 95  BUN 48*  --  47*  --  38* 33*  CREATININE 3.21*  --  3.13*  --  2.56* 2.46*  CALCIUM 9.4  --  9.1  --  8.7* 8.9   < > = values in this interval not displayed.   Liver Function Tests: Recent Labs  Lab 10/01/19 0222 10/02/19 1126  AST 74* 63*  ALT 43 36  ALKPHOS 290* 242*  BILITOT 3.0* 3.2*  PROT 8.4* 6.8  ALBUMIN 4.4 3.5   No results for input(s): LIPASE, AMYLASE in the last 168 hours. No results for input(s): AMMONIA in the last 168 hours. CBC: Recent Labs  Lab 10/01/19 0222 10/02/19 1326 10/03/19 0533  WBC 22.9* 20.4* 16.5*  NEUTROABS 11.8* 10.1* 6.8  HGB 6.7* 5.3* 7.8*  HCT 19.9* 15.3* 22.5*  MCV 98.0 92.2 91.1  PLT 166 151 173   Cardiac Enzymes: No results for input(s): CKTOTAL, CKMB, CKMBINDEX, TROPONINI in the last 168 hours. BNP: Invalid input(s): POCBNP CBG: No results for input(s): GLUCAP in the last 168 hours.  Time coordinating discharge: 50 minutes  Signed:  [facility] 10/04/2019, 12:37 PM

## 2019-10-08 ENCOUNTER — Telehealth: Payer: Self-pay | Admitting: Family Medicine

## 2019-10-08 ENCOUNTER — Other Ambulatory Visit: Payer: Self-pay | Admitting: Family Medicine

## 2019-10-08 DIAGNOSIS — D571 Sickle-cell disease without crisis: Secondary | ICD-10-CM

## 2019-10-08 IMAGING — US US ABDOMEN COMPLETE
1 series · 13 of 25 positions shown · non-contrast
Comparison: CT abdomen pelvis dated April 19, 2017.

CLINICAL DATA: Abdominal pain with ascites.

EXAM:
ABDOMEN ULTRASOUND COMPLETE
DUPLEX LIVER ULTRASOUND
TECHNIQUE: Grayscale and color ultrasound of the abdomen was performed. Color
and duplex Doppler ultrasound was also performed to evaluate the
hepatic in-flow and out-flow vessels.

[Series 1: us abdomen complete · 0.23mm/px · 13 of 99 slices shown]
[im 1/99]
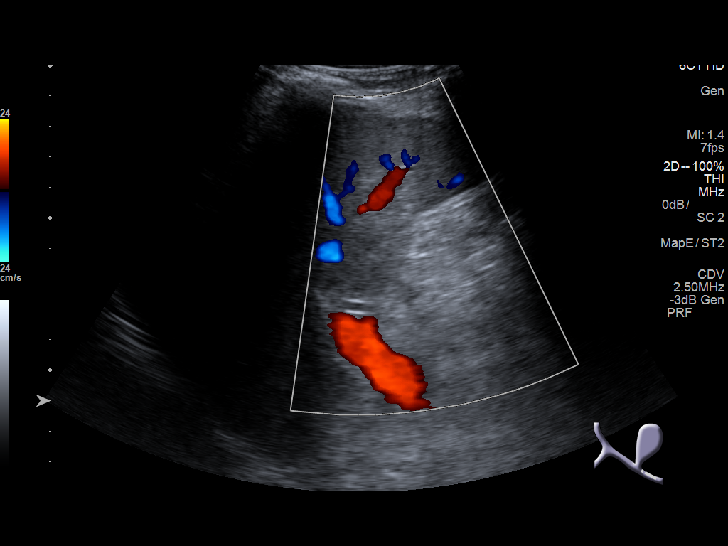
[im 9/99]
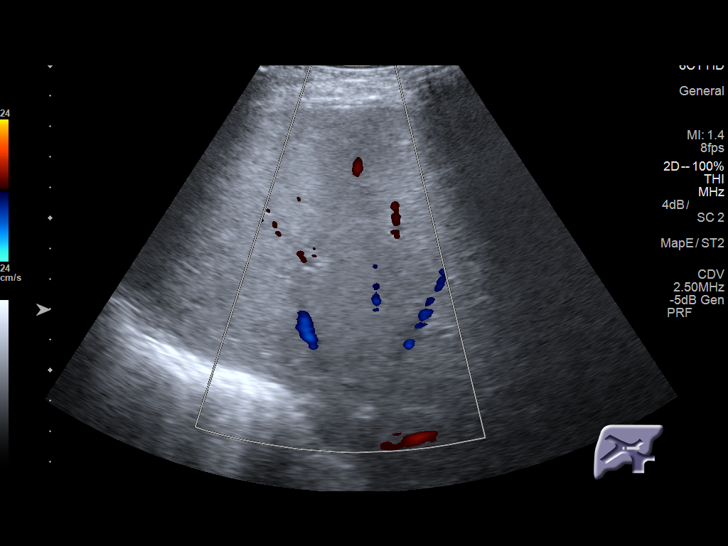
[im 17/99]
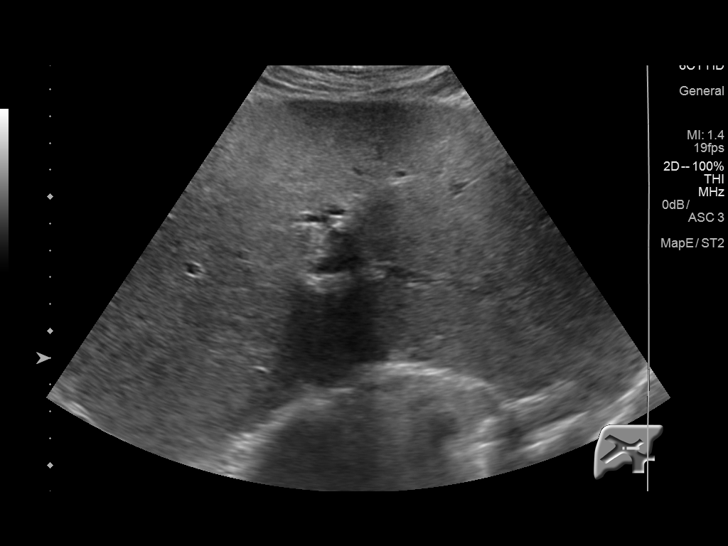
[im 25/99]
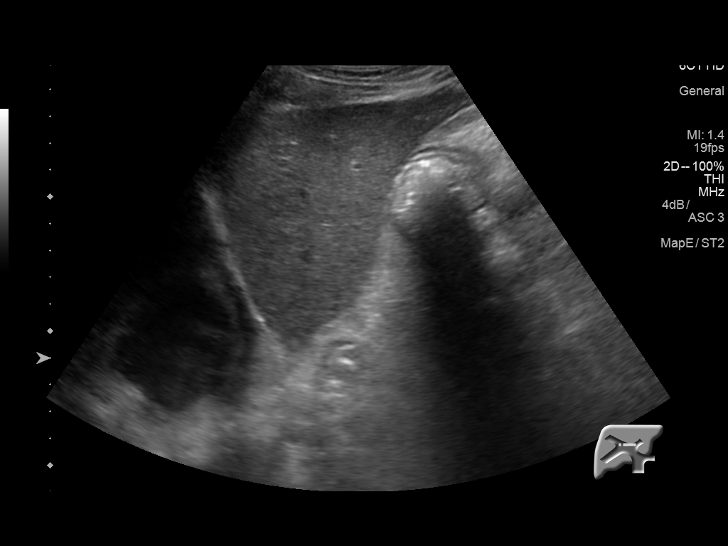
[im 33/99]
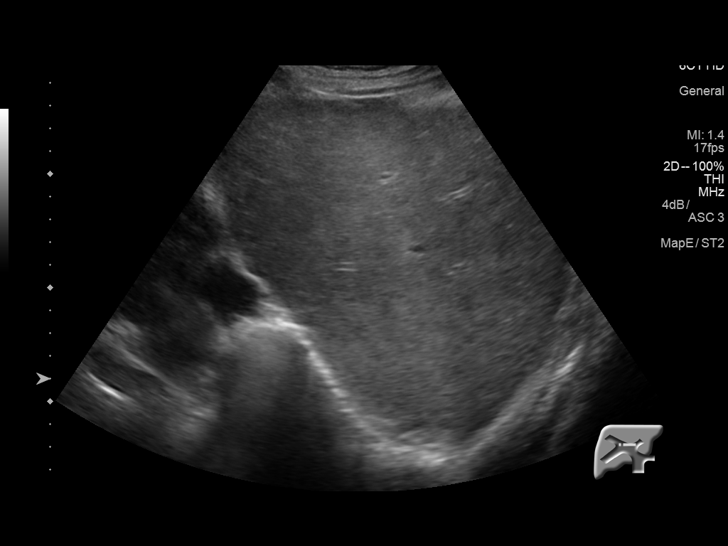
[im 41/99]
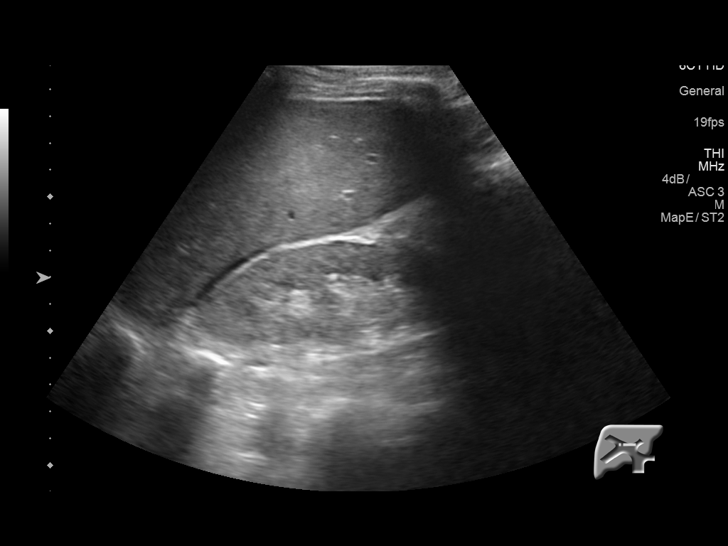
[im 50/99]
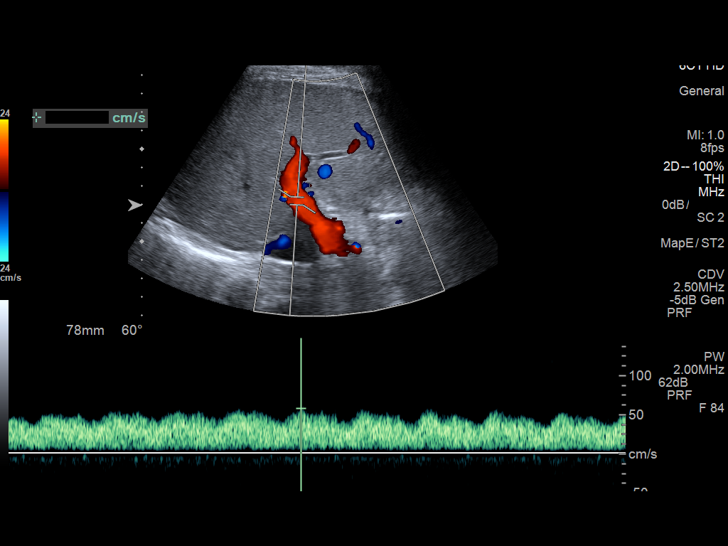
[im 58/99]
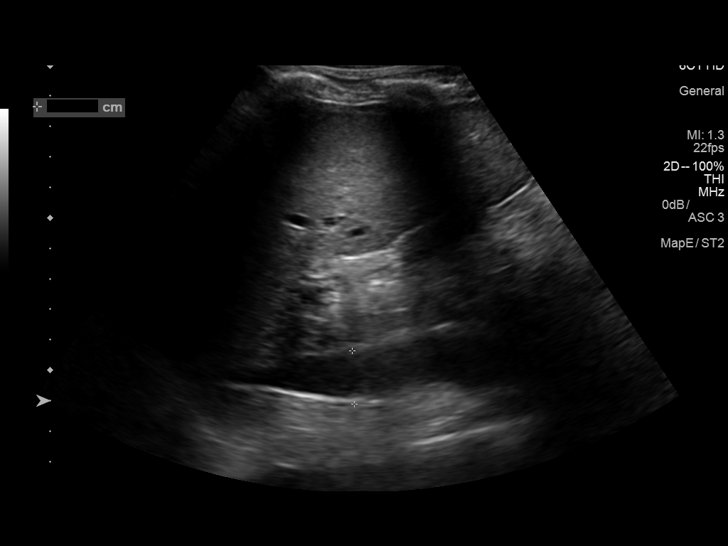
[im 66/99]
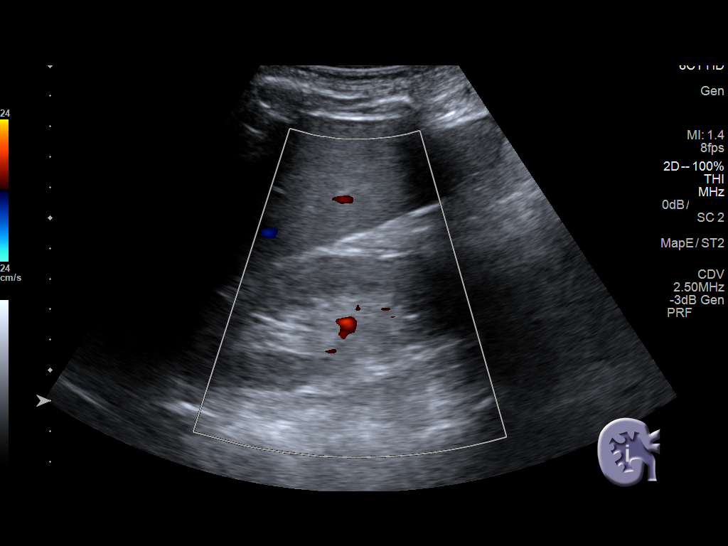
[im 74/99]
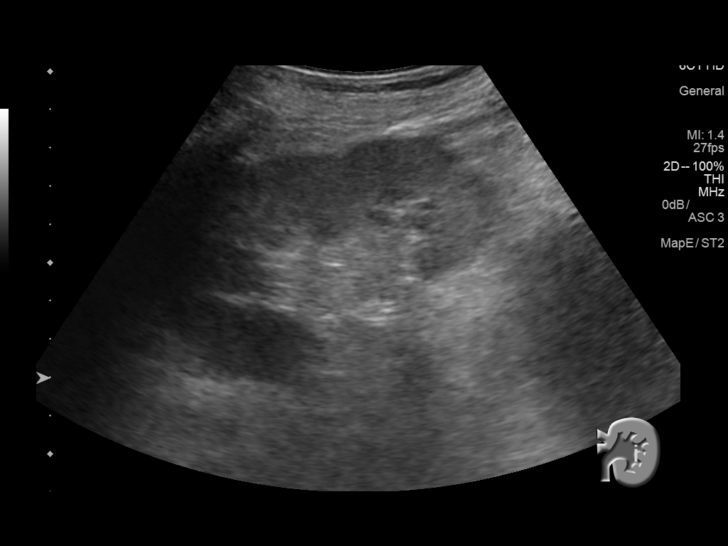
[im 82/99]
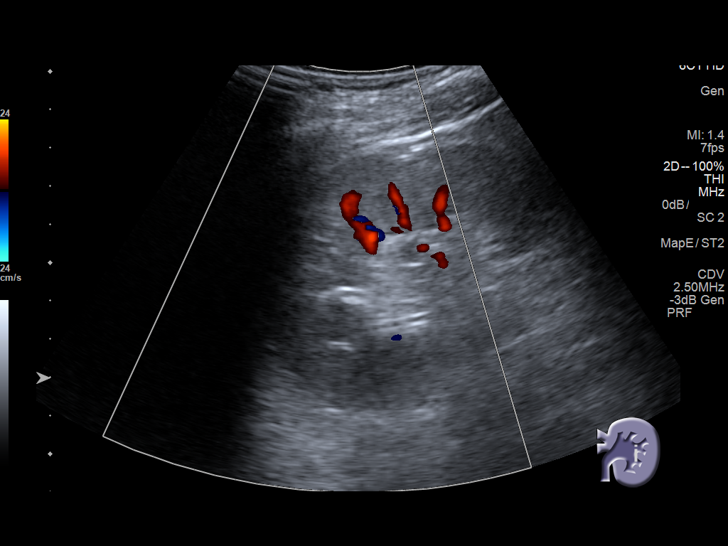
[im 90/99]
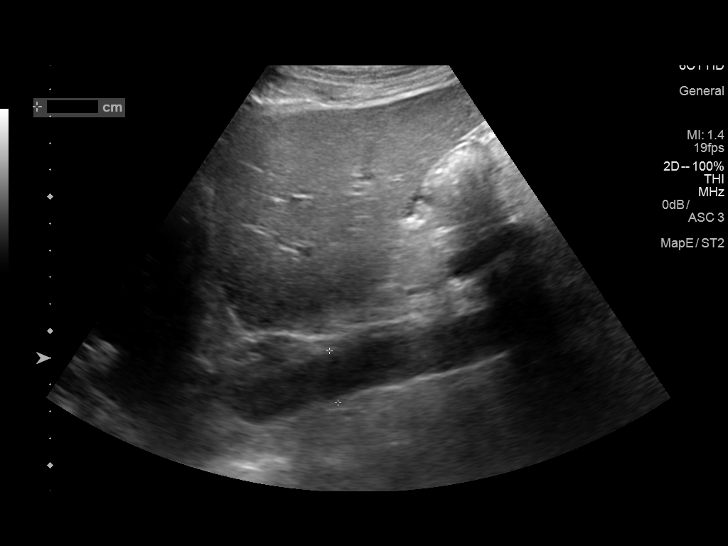
[im 99/99]
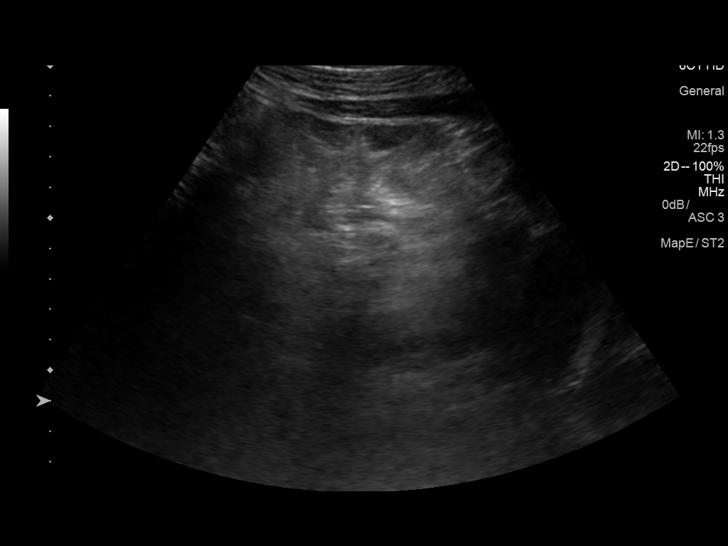

[13 of 25 positions shown; findings below may reference images not displayed]

FINDINGS: Gallbladder: Surgically absent.

Common bile duct: Diameter: 3 mm, normal

Liver: No focal lesion identified. Increased in parenchymal
echogenicity. Portal vein is patent on color Doppler imaging with
normal direction of blood flow towards the liver.

IVC: No abnormality visualized.

Pancreas: Visualized portion unremarkable.

Spleen: Surgically absent.

Right Kidney: Length: 10.4 cm. Echogenicity within normal limits. No
mass or hydronephrosis visualized.

Left Kidney: Length: 9.2 cm. Echogenicity within normal limits. No
mass or hydronephrosis visualized.

Abdominal aorta: No aneurysm visualized.

Other findings: None.

Portal Vein Velocities

Main: 58.2 cm/sec, hepatopetal

Right: 16.4 cm/sec, hepatopetal

Left: 25.5 cm/sec, hepatopetal

Hepatic Vein Velocities

Right: 26.5 cm/sec, hepatofugal

Middle: 52.3 cm/sec, hepatofugal

Left: 36.5 cm/sec, hepatofugal

Hepatic Artery Velocity: 50.4 cm/sec

Varices: Absent.

Ascites: Trace perihepatic ascites.
IMPRESSION: 1. Increased hepatic parenchymal echogenicity as can be seen with
liver disease. No focal abnormality. Trace perihepatic ascites.
2. Normal velocities and direction of flow within the hepatic
vessels.

## 2019-10-08 MED ORDER — OXYCODONE HCL 10 MG PO TABS: 10.0000 mg | ORAL_TABLET | Freq: Four times a day (QID) | ORAL | 0 refills | Status: DC | PRN

## 2019-10-08 NOTE — Telephone Encounter (Signed)
[name] [name] 

## 2019-10-08 NOTE — Progress Notes (Signed)
Reviewed PDMP substance reporting system prior to prescribing opiate medications. No inconsistencies noted.   Meds ordered this encounter  Medications  . Oxycodone HCl 10 MG TABS    Sig: Take 1 tablet (10 mg total) by mouth every 6 (six) hours as needed for up to 15 days.    Dispense:  60 tablet    Refill:  0    Order Specific Question:   Supervising Provider    Answer:   [name] [[id number]]     [name]  APRN, MSN, FNP-C Patient [place] [facility] [place] [address] [place], [place] [address] [phone]

## 2019-10-10 IMAGING — CR CHEST - 2 VIEW
2 series · 2 of 2 positions shown · non-contrast
Comparison: 03/06/2018

CLINICAL DATA: Sickle cell crisis.  Pain.

EXAM:
CHEST - 2 VIEW

[w chest lat]
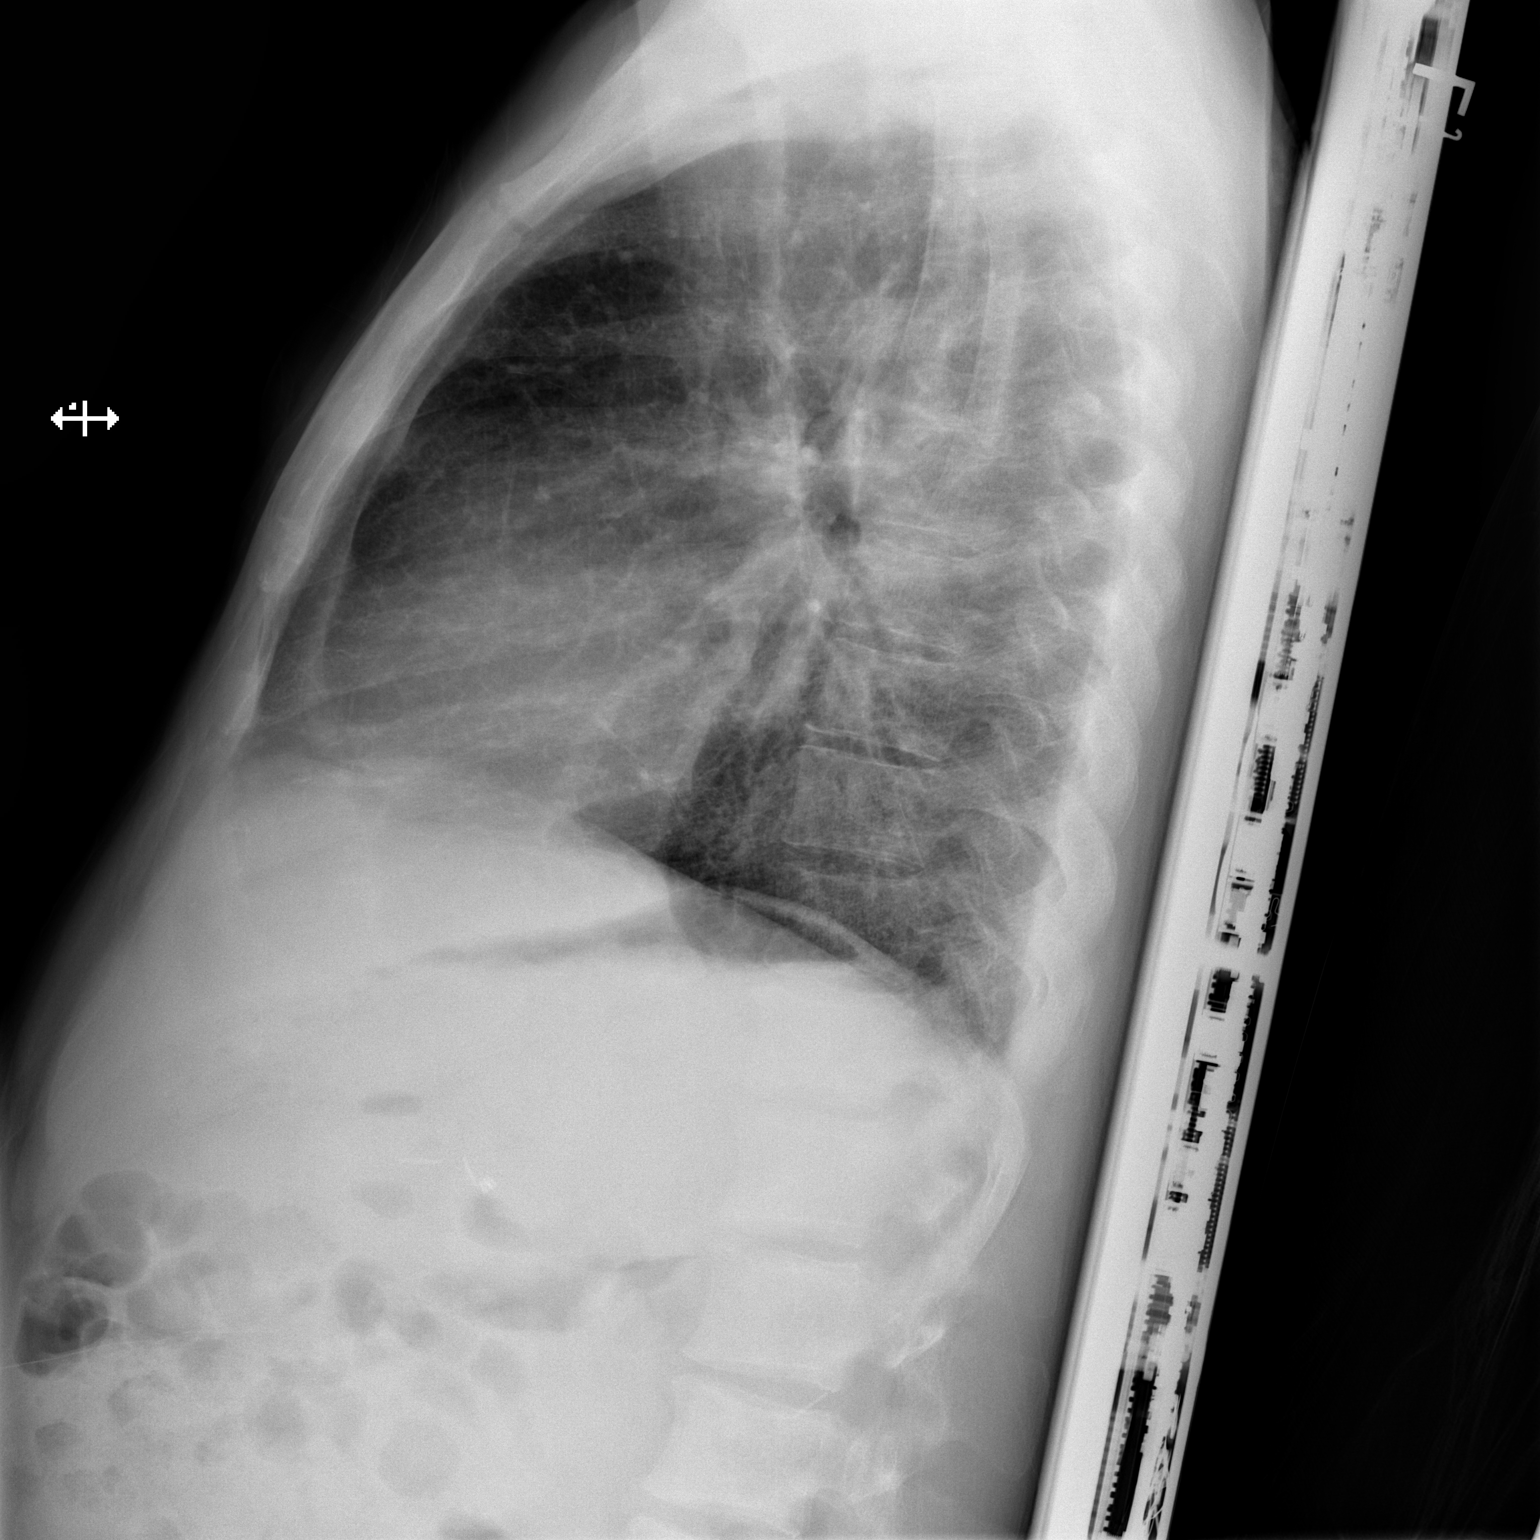

[x chest ap]
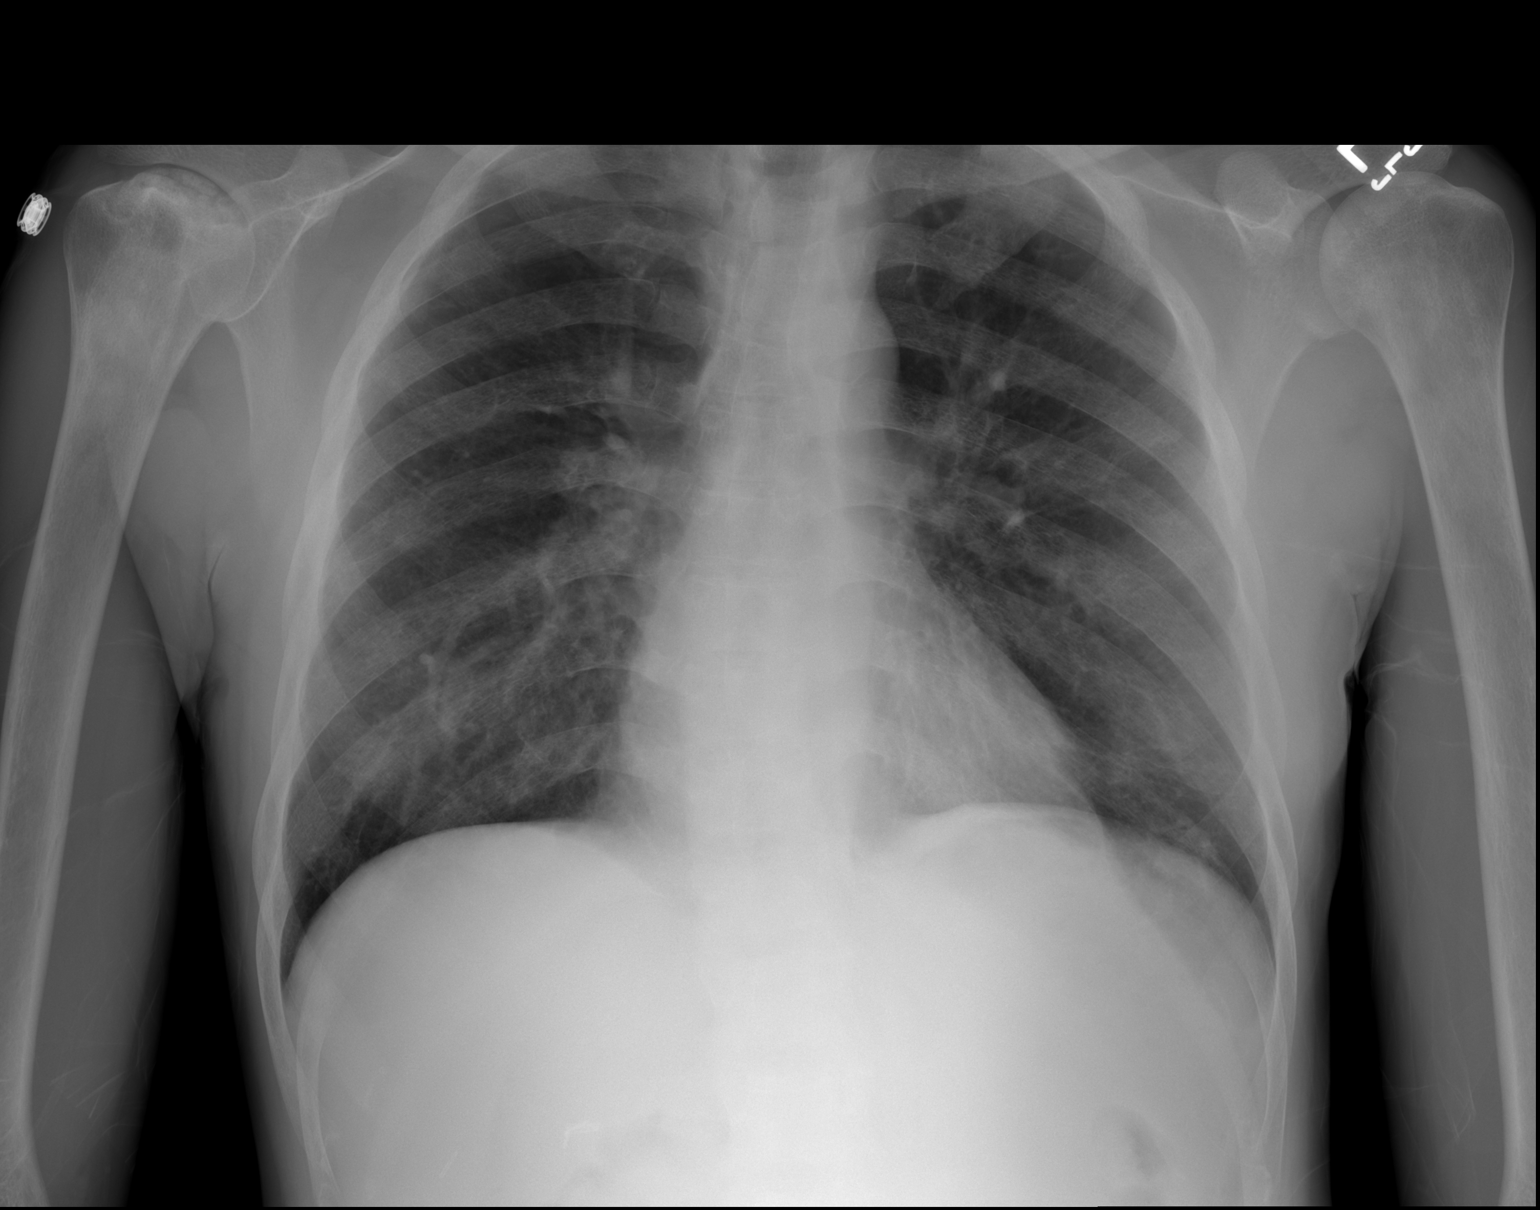

[2 of 2 positions shown; findings below may reference images not displayed]

FINDINGS: The lungs are clear without focal pneumonia, edema, pneumothorax or
pleural effusion. The cardiopericardial silhouette is within normal
limits for size. Bony changes compatible with history of sickle cell
disease, including avascular necrosis of the right humeral head.
IMPRESSION: No active cardiopulmonary disease.

## 2019-10-14 ENCOUNTER — Other Ambulatory Visit: Payer: Self-pay

## 2019-10-14 ENCOUNTER — Encounter: Payer: Self-pay | Admitting: Family Medicine

## 2019-10-14 ENCOUNTER — Ambulatory Visit (INDEPENDENT_AMBULATORY_CARE_PROVIDER_SITE_OTHER): Payer: Medicare Other | Admitting: Family Medicine

## 2019-10-14 VITALS — BP 120/76 | HR 92 | Temp 98.1°F | Ht 63.0 in | Wt 118.0 lb

## 2019-10-14 DIAGNOSIS — E559 Vitamin D deficiency, unspecified: Secondary | ICD-10-CM | POA: Diagnosis not present

## 2019-10-14 DIAGNOSIS — N184 Chronic kidney disease, stage 4 (severe): Secondary | ICD-10-CM | POA: Diagnosis not present

## 2019-10-14 DIAGNOSIS — D571 Sickle-cell disease without crisis: Secondary | ICD-10-CM | POA: Diagnosis not present

## 2019-10-14 LAB — POCT URINALYSIS DIPSTICK
Bilirubin, UA: NEGATIVE
Glucose, UA: POSITIVE — AB
Ketones, UA: NEGATIVE
Leukocytes, UA: NEGATIVE
Nitrite, UA: NEGATIVE
Protein, UA: POSITIVE — AB
Spec Grav, UA: 1.025 (ref 1.010–1.025)
Urobilinogen, UA: 0.2 E.U./dL
pH, UA: 7 (ref 5.0–8.0)

## 2019-10-14 MED ORDER — OXYCODONE HCL 10 MG PO TABS: 10.0000 mg | ORAL_TABLET | Freq: Four times a day (QID) | ORAL | 0 refills | Status: AC | PRN

## 2019-10-14 MED FILL — oxyCODONE HCL 10 MG TABS: 10 | 15 days supply | Qty: 60 | Fill #0

## 2019-10-14 NOTE — Progress Notes (Signed)
[facility]   Established Patient Office Visit  Subjective:  Patient ID: [name] Linch., male    DOB: 1980/12/19  Age: 40 y.o. MRN: [id number]  CC:  Chief Complaint  Patient presents with   Follow-up    having bilateral leg pain due to [facility] crisis last week    HPI [name] [name]. 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  Chronic pain syndrome, opiate dependence and tolerance, and history of anemia of chronic disease presents for medication management and follow-up of sickle cell disease.  Patient is status post hospitalization, he was discharged home 1 week ago.  Patient continues to have pain primarily to lower extremities.  Pain is worsened with ambulation.  He rates pain as 7/10 characterized as intermittent and aching.  Patient has not taken oxycodone today.  He last had OxyContin last night with some relief.  He denies any headache, chest pain, urinary symptoms,  Past Medical History:  Diagnosis Date   Abscess of right iliac fossa 09/24/2018   required Perc Drain 09/24/2018   Arachnoid Cyst of brain bilaterally    "2 really small ones in the back of my head; inside; saw them w/MRI" (09/25/2012)   Bacterial pneumonia ~ 2012   "caught it here in the hospital" (09/25/2012)   Chronic kidney disease    "from my sickle cell" (09/25/2012)   CKD (chronic kidney disease), stage II    Colitis 04/19/2017   CT scan abd/ pelvis   GERD (gastroesophageal reflux disease)    "after I eat alot of spicey foods" (09/25/2012)   Gynecomastia, male 07/10/2012   History of blood transfusion    "always related to sickle cell crisis" (09/25/2012)   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   Noted in noted from Hematology notes at [place]    "take RX qd to prevent them" (09/25/2012)    Opioid dependence with withdrawal ([place]) 08/30/2012   Renal insufficiency    Sickle cell anemia (HCC)    Sickle cell crisis ([place]) 09/25/2012   Sickle cell nephropathy ([place]) 07/10/2012   Sinus tachycardia    Tachycardia with heart rate 121-140 beats per minute with ambulation 08/04/2016    Past Surgical History:  Procedure Laterality Date   ABCESS DRAINAGE     CHOLECYSTECTOMY  ~ 2012   COLONOSCOPY N/A 04/23/2017   Procedure: COLONOSCOPY;  Surgeon: Irene Shipper, MD;  Location: [facility];  Service: Endoscopy;  Laterality: N/A;   IR FLUORO GUIDE CV LINE RIGHT  12/17/2016   IR RADIOLOGIST EVAL & MGMT  10/02/2018   IR RADIOLOGIST EVAL & MGMT  10/15/2018   IR REMOVAL TUN CV CATH W/O FL  12/21/2016   IR US GUIDE VASC ACCESS RIGHT  12/17/2016   spleenectomy      Family History  Problem Relation Age of Onset   Breast cancer Mother     Social History   Socioeconomic History   Marital status: Single    Spouse name: Not on file   Number of children: Not on file   Years of education: Not on file   Highest education level: Not on file  Occupational History   Not on file  Tobacco Use   Smoking status: Current Some Day Smoker    Packs/day: 0.10    Types: Cigarettes   Smokeless tobacco: Never Used   Tobacco  comment: 09/25/2012 "I don't buy cigarettes; bum one from friends q now and then"  Vaping Use   Vaping Use: Never used  Substance and Sexual Activity   Alcohol use: Yes    Alcohol/week: 0.0 standard drinks    Comment: Once a month   Drug use: Yes    Types: Marijuana    Comment: Every 2-3 weeks    Sexual activity: Yes  Other Topics Concern   Not on file  Social History Narrative    Lives alone in [place].   Back at school, studying Public relations account executive. Unemployed.    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     Social Determinants of Health   Financial Resource Strain:    Difficulty of Paying Living Expenses: Not on file  Food Insecurity:    Worried About Charity fundraiser in the Last Year: Not on file   [employer] of Food in the Last Year: Not on file  Transportation Needs:    Lack of Transportation (Medical): Not on file   Lack of Transportation (Non-Medical): Not on file  Physical Activity:    Days of Exercise per Week: Not on file   Minutes of Exercise per Session: Not on file  Stress:    Feeling of Stress : Not on file  Social Connections:    Frequency of Communication with Friends and Family: Not on file   Frequency of Social Gatherings with Friends and Family: Not on file   Attends Religious Services: Not on file   Active Member of Clubs or Organizations: Not on file   Attends Archivist Meetings: Not on file   Marital Status: Not on file  Intimate Partner Violence:    Fear of Current or Ex-Partner: Not on file   Emotionally Abused: Not on file   Physically Abused: Not on file   Sexually Abused: Not on file    Outpatient Medications Prior to Visit  Medication Sig Dispense Refill   acetaminophen (TYLENOL) 500 MG tablet Take 1 tablet (500 mg total) by mouth every 6 (six) hours as needed. (Patient taking differently: Take 500 mg by mouth every 6 (six) hours as needed for mild pain. ) 30 tablet 0   Cholecalciferol (VITAMIN D3) 50 MCG (2000 UT) TABS Take 100 mg by mouth daily.      folic acid (FOLVITE) 1 MG tablet Take 1 tablet (1 mg total) by mouth daily. 30 tablet 12   nortriptyline (PAMELOR) 25 MG capsule Take 1 capsule (25 mg total) by mouth 2 (two) times daily. 90 capsule 3   oxyCODONE (OXYCONTIN) 15 mg 12 hr tablet Take 1 tablet (15 mg total) by mouth every 12 (twelve) hours. 60 tablet 0   sodium bicarbonate 650 MG tablet Take 650 mg by mouth 2 (two) times daily.     gabapentin (NEURONTIN) 300 MG capsule Take 1 capsule (300 mg total) by  mouth 2 (two) times daily as needed. (Patient not taking: Reported on 06/12/2019) 60 capsule 1   Oxycodone HCl 10 MG TABS Take 1 tablet (10 mg total) by mouth every 6 (six) hours as needed for up to 15 days. (Patient not taking: Reported on 10/14/2019) 60 tablet 0   No facility-administered medications prior to visit.    Allergies  Allergen Reactions   Nsaids Other (See Comments)    Kidney disease  Morphine And Related Other (See Comments)    "Real bad headaches"     ROS Review of Systems  Constitutional: Negative for fatigue.  HENT: Negative.   Eyes: Negative.   Respiratory: Negative.   Cardiovascular: Negative.   Genitourinary: Negative.   Musculoskeletal: Positive for arthralgias and back pain.  Skin: Negative.   Neurological: Negative.   Psychiatric/Behavioral: Negative.       Objective:    Physical Exam Constitutional:      Appearance: Normal appearance.  Eyes:     Pupils: Pupils are equal, round, and reactive to light.  Cardiovascular:     Rate and Rhythm: Normal rate and regular rhythm.     Pulses: Normal pulses.     Heart sounds: Murmur heard.   Pulmonary:     Effort: Pulmonary effort is normal.  Abdominal:     General: Abdomen is flat. Bowel sounds are normal.  Musculoskeletal:        General: Normal range of motion.  Skin:    General: Skin is warm.  Neurological:     General: No focal deficit present.     Mental Status: He is alert. Mental status is at baseline.  Psychiatric:        Mood and Affect: Mood normal.        Behavior: Behavior normal.        Thought Content: Thought content normal.        Judgment: Judgment normal.     BP 120/76    Pulse 92    Temp 98.1 F (36.7 C) (Temporal)    Ht 5\' 3"  (1.6 m)    Wt 118 lb (53.5 kg)    SpO2 100%    BMI 20.90 kg/m  Wt Readings from Last 3 Encounters:  10/14/19 118 lb (53.5 kg)  10/01/19 111 lb 15.9 oz (50.8 kg)  08/25/19 125 lb (56.7 kg)     Health Maintenance Due  Topic Date Due    COVID-19 Vaccine (2 - Moderna 2-dose series) 06/26/2019   INFLUENZA VACCINE  09/14/2019    There are no preventive care reminders to display for this patient.  Lab Results  Component Value Date   TSH 2.860 05/11/2017   Lab Results  Component Value Date   WBC 16.5 (H) 10/03/2019   HGB 7.8 (L) 10/03/2019   HCT 22.5 (L) 10/03/2019   MCV 91.1 10/03/2019   PLT 173 10/03/2019   Lab Results  Component Value Date   NA 133 (L) 10/03/2019   K 4.9 10/03/2019   CO2 16 (L) 10/03/2019   GLUCOSE 95 10/03/2019   BUN 33 (H) 10/03/2019   CREATININE 2.46 (H) 10/03/2019   BILITOT 3.2 (H) 10/02/2019   ALKPHOS 242 (H) 10/02/2019   AST 63 (H) 10/02/2019   ALT 36 10/02/2019   PROT 6.8 10/02/2019   ALBUMIN 3.5 10/02/2019   CALCIUM 8.9 10/03/2019   ANIONGAP 12 10/03/2019   Lab Results  Component Value Date   CHOL 145 03/07/2018   Lab Results  Component Value Date   HDL 65 03/07/2018   Lab Results  Component Value Date   LDLCALC 30 03/07/2018   Lab Results  Component Value Date   TRIG 251 (H) 03/07/2018   Lab Results  Component Value Date   CHOLHDL 2.2 03/07/2018   Lab Results  Component Value Date   HGBA1C <4.2 (L) 03/07/2018      Assessment & Plan:   Problem List Items Addressed This Visit      Other  Hb-SS disease without crisis ([place])   Relevant Medications   Oxycodone HCl 10 MG TABS    Other Visit Diagnoses    Stage 3b chronic kidney disease    -  Primary   Relevant Orders   POCT urinalysis dipstick (Completed)      Meds ordered this encounter  Medications   Oxycodone HCl 10 MG TABS    Sig: Take 1 tablet (10 mg total) by mouth every 6 (six) hours as needed for up to 15 days.    Dispense:  60 tablet    Refill:  0    Order Specific Question:   Supervising Provider    Answer:   [name] [id number]   Hb-SS disease without crisis ([place]) Reviewed PDMP substance reporting system prior to prescribing opiate medications. No inconsistencies noted.   Will resume Aranesp injections - Oxycodone HCl 10 MG TABS; Take 1 tablet (10 mg total) by mouth every 6 (six) hours as needed for up to 15 days.  Dispense: 60 tablet; Refill: 0 - Sickle Cell Panel  Vitamin D deficiency - Sickle Cell Panel  CKD (chronic kidney disease), stage IV ([place]) Patient to follow up with nephrology as scheduled. - POCT urinalysis dipstick - Sickle Cell Panel  Follow-up: Return in about 3 months (around 01/13/2020).    [name]  APRN, MSN, FNP-C Patient [facility] [address] [place], [place] [address] [phone]

## 2019-10-14 NOTE — Patient Instructions (Signed)
Sickle Cell Anemia, Adult  Sickle cell anemia is a condition where your red blood cells are shaped like sickles. Red blood cells carry oxygen through the body. Sickle-shaped cells do not live as long as normal red blood cells. They also clump together and block blood from flowing through the blood vessels. This prevents the body from getting enough oxygen. Sickle cell anemia causes organ damage and pain. It also increases the risk of infection. Follow these instructions at home: Medicines  Take over-the-counter and prescription medicines only as told by your doctor.  If you were prescribed an antibiotic medicine, take it as told by your doctor. Do not stop taking the antibiotic even if you start to feel better.  If you develop a fever, do not take medicines to lower the fever right away. Tell your doctor about the fever. Managing pain, stiffness, and swelling  Try these methods to help with pain: ? Use a heating pad. ? Take a warm bath. ? Distract yourself, such as by watching TV. Eating and drinking  Drink enough fluid to keep your pee (urine) clear or pale yellow. Drink more in hot weather and during exercise.  Limit or avoid alcohol.  Eat a healthy diet. Eat plenty of fruits, vegetables, whole grains, and lean protein.  Take vitamins and supplements as told by your doctor. Traveling  When traveling, keep these with you: ? Your medical information. ? The names of your doctors. ? Your medicines.  If you need to take an airplane, talk to your doctor first. Activity  Rest often.  Avoid exercises that make your heart beat much faster, such as jogging. General instructions  Do not use products that have nicotine or tobacco, such as cigarettes and e-cigarettes. If you need help quitting, ask your doctor.  Consider wearing a medical alert bracelet.  Avoid being in high places (high altitudes), such as mountains.  Avoid very hot or cold temperatures.  Avoid places where the  temperature changes a lot.  Keep all follow-up visits as told by your doctor. This is important. Contact a doctor if:  A joint hurts.  Your feet or hands hurt or swell.  You feel tired (fatigued). Get help right away if:  You have symptoms of infection. These include: ? Fever. ? Chills. ? Being very tired. ? Irritability. ? Poor eating. ? Throwing up (vomiting).  You feel dizzy or faint.  You have new stomach pain, especially on the left side.  You have a an erection (priapism) that lasts more than 4 hours.  You have numbness in your arms or legs.  You have a hard time moving your arms or legs.  You have trouble talking.  You have pain that does not go away when you take medicine.  You are short of breath.  You are breathing fast.  You have a long-term cough.  You have pain in your chest.  You have a bad headache.  You have a stiff neck.  Your stomach looks bloated even though you did not eat much.  Your skin is pale.  You suddenly cannot see well. Summary  Sickle cell anemia is a condition where your red blood cells are shaped like sickles.  Follow your doctor's advice on ways to manage pain, food to eat, activities to do, and steps to take for safe travel.  Get medical help right away if you have any signs of infection, such as a fever. This information is not intended to replace advice given to you by   your health care provider. Make sure you discuss any questions you have with your health care provider. Document Revised: 05/24/2018 Document Reviewed: 03/07/2016 Elsevier Patient Education  2020 [employer] Inc.  

## 2019-10-15 ENCOUNTER — Telehealth: Payer: Self-pay | Admitting: Family Medicine

## 2019-10-15 LAB — CMP14+CBC/D/PLT+FER+RETIC+V...
ALT: 28 IU/L (ref 0–44)
AST: 46 IU/L — ABNORMAL HIGH (ref 0–40)
Albumin/Globulin Ratio: 1.4 (ref 1.2–2.2)
Albumin: 3.9 g/dL — ABNORMAL LOW (ref 4.0–5.0)
Alkaline Phosphatase: 341 IU/L — ABNORMAL HIGH (ref 48–121)
BUN/Creatinine Ratio: 16 (ref 9–20)
BUN: 38 mg/dL — ABNORMAL HIGH (ref 6–20)
Basophils Absolute: 0 10*3/uL (ref 0.0–0.2)
Basos: 0 %
Bilirubin Total: 2 mg/dL — ABNORMAL HIGH (ref 0.0–1.2)
CO2: 18 mmol/L — ABNORMAL LOW (ref 20–29)
Calcium: 9.1 mg/dL (ref 8.7–10.2)
Chloride: 105 mmol/L (ref 96–106)
Creatinine, Ser: 2.42 mg/dL — ABNORMAL HIGH (ref 0.76–1.27)
EOS (ABSOLUTE): 0.4 10*3/uL (ref 0.0–0.4)
Eos: 2 %
Ferritin: 5804 ng/mL — ABNORMAL HIGH (ref 30–400)
GFR calc Af Amer: 38 mL/min/{1.73_m2} — ABNORMAL LOW (ref >59)
GFR calc non Af Amer: 33 mL/min/{1.73_m2} — ABNORMAL LOW (ref >59)
Globulin, Total: 2.8 g/dL (ref 1.5–4.5)
Glucose: 91 mg/dL (ref 65–99)
Hematocrit: 20.1 % — ABNORMAL LOW (ref 37.5–51.0)
Hemoglobin: 6.7 g/dL — CL (ref 13.0–17.7)
Immature Grans (Abs): 0 10*3/uL (ref 0.0–0.1)
Immature Granulocytes: 0 %
Lymphocytes Absolute: 6.9 10*3/uL — ABNORMAL HIGH (ref 0.7–3.1)
Lymphs: 44 %
MCH: 30.2 pg (ref 26.6–33.0)
MCHC: 33.3 g/dL (ref 31.5–35.7)
MCV: 91 fL (ref 79–97)
Monocytes Absolute: 1.4 10*3/uL — ABNORMAL HIGH (ref 0.1–0.9)
Monocytes: 9 %
Neutrophils Absolute: 7.1 10*3/uL — ABNORMAL HIGH (ref 1.4–7.0)
Neutrophils: 45 %
Platelets: 317 10*3/uL (ref 150–450)
Potassium: 5.2 mmol/L (ref 3.5–5.2)
RBC: 2.22 x10E6/uL — CL (ref 4.14–5.80)
RDW: 22.8 % — ABNORMAL HIGH (ref 11.6–15.4)
Retic Ct Pct: 6.3 % — ABNORMAL HIGH (ref 0.6–2.6)
Sodium: 136 mmol/L (ref 134–144)
Total Protein: 6.7 g/dL (ref 6.0–8.5)
Vit D, 25-Hydroxy: 46.1 ng/mL (ref 30.0–100.0)
WBC: 15.7 10*3/uL — ABNORMAL HIGH (ref 3.4–10.8)

## 2019-10-15 IMAGING — US US ABDOMEN LIMITED
1 series · 14 of 15 positions shown · non-contrast
Comparison: None.

CLINICAL DATA: Left lower quadrant pain.  Sickle cell disease.

EXAM:
ULTRASOUND ABDOMEN LIMITED

[Series 1: us abdomen limited · 0.09mm/px · 14 of 15 slices shown]
[im 1/15]
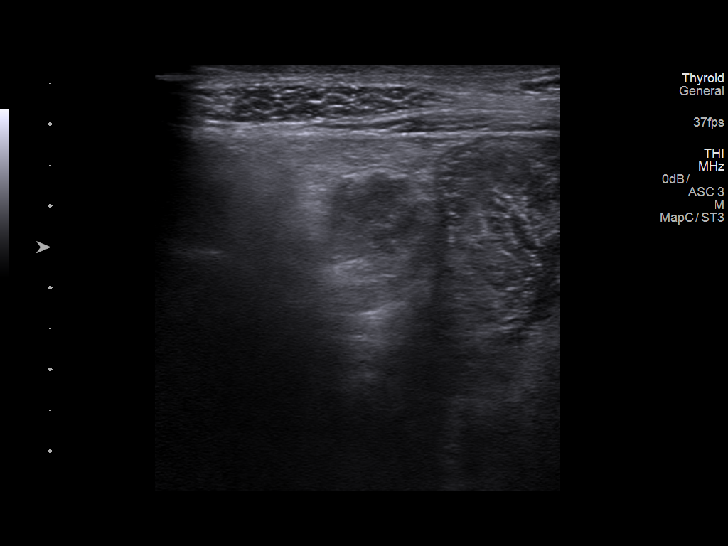
[im 2/15]
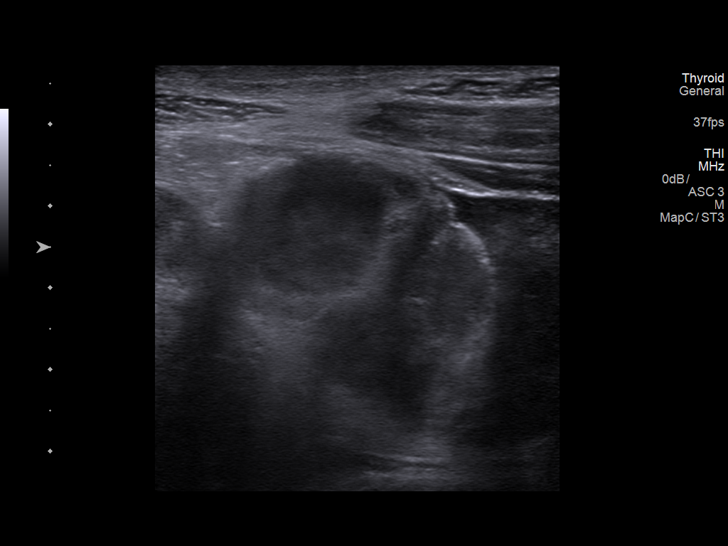
[im 3/15]
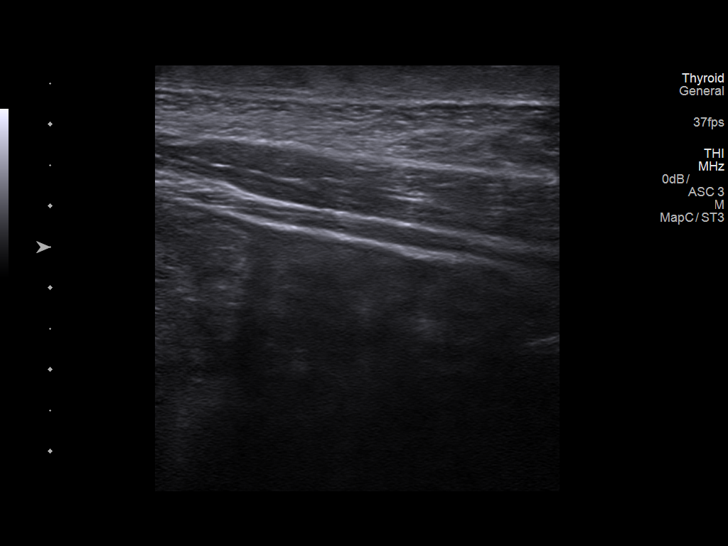
[im 4/15]
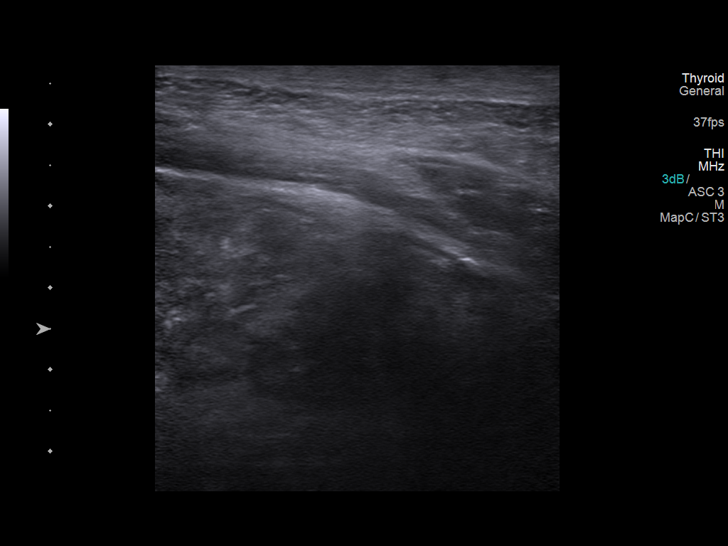
[im 5/15]
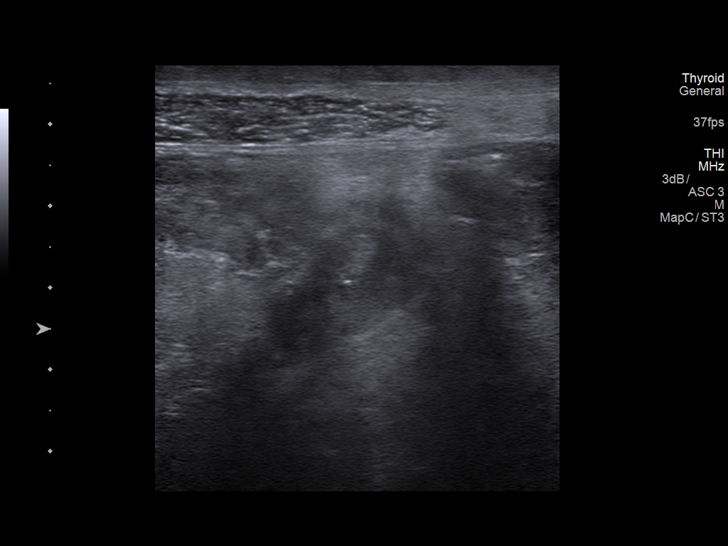
[im 6/15]
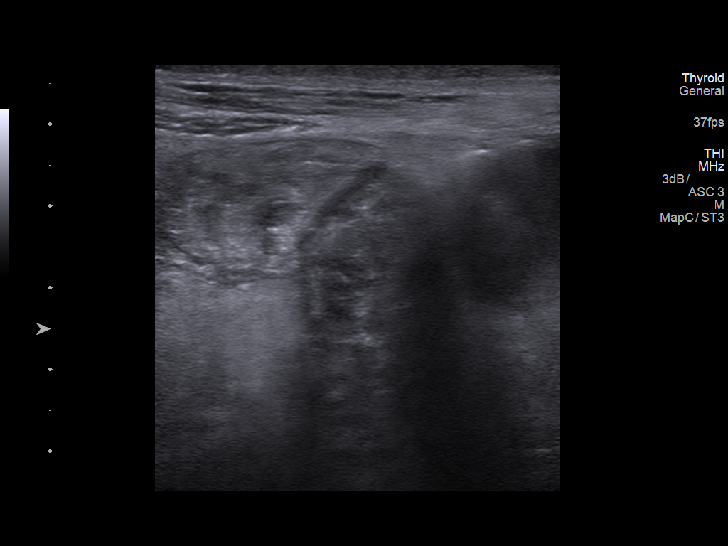
[im 7/15]
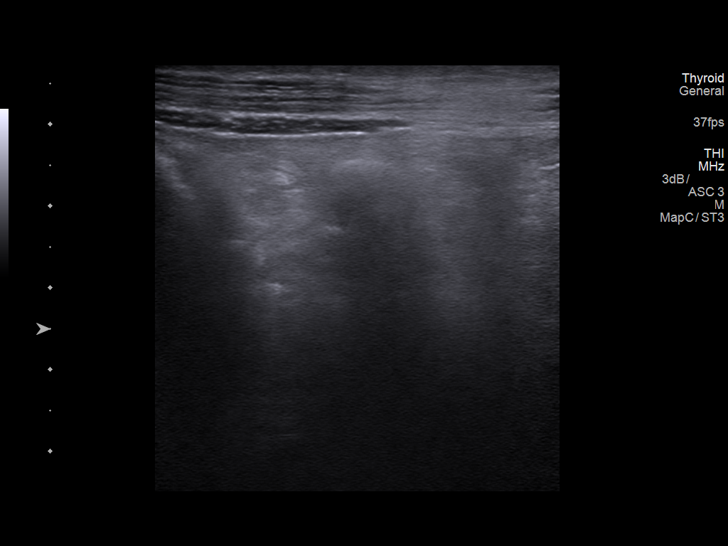
[im 9/15]
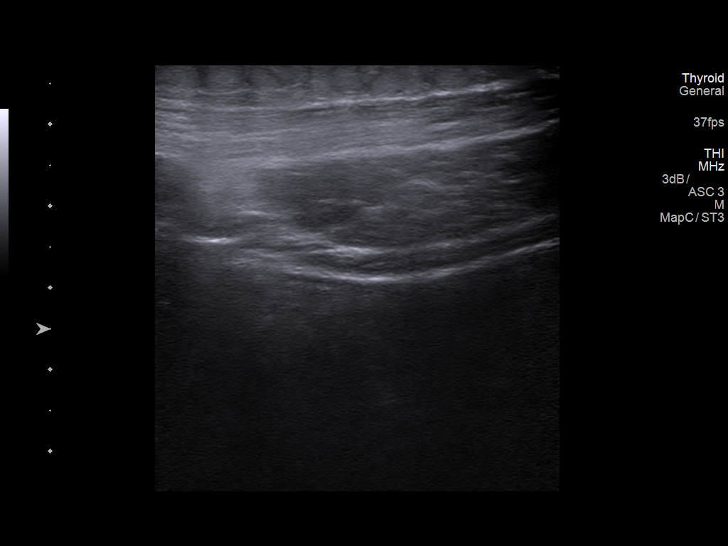
[im 10/15]
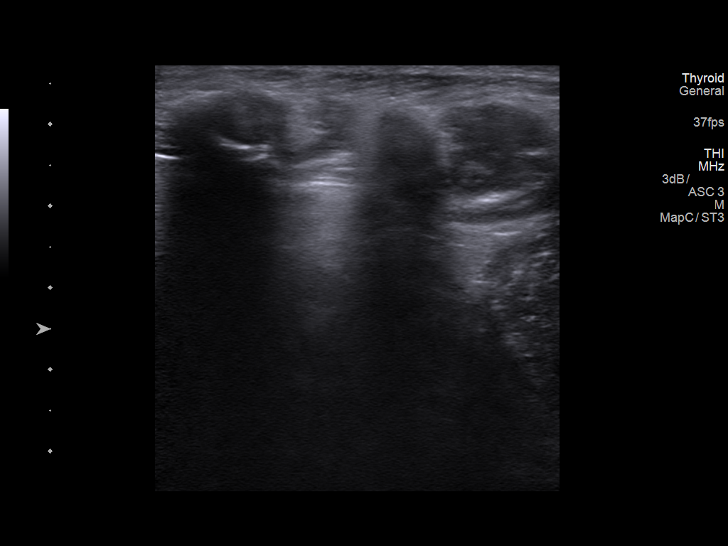
[im 11/15]
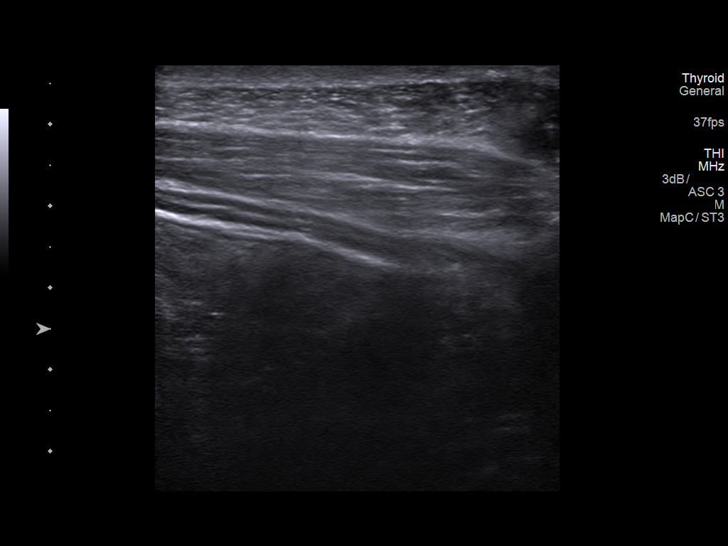
[im 12/15]
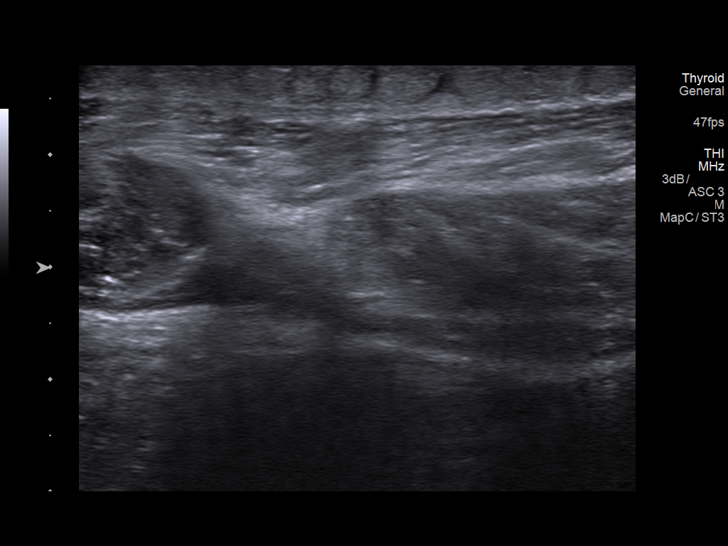
[im 13/15]
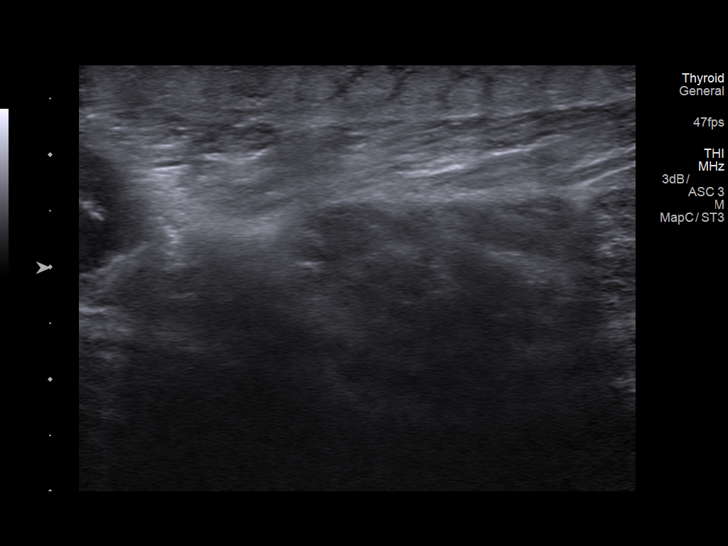
[im 14/15]
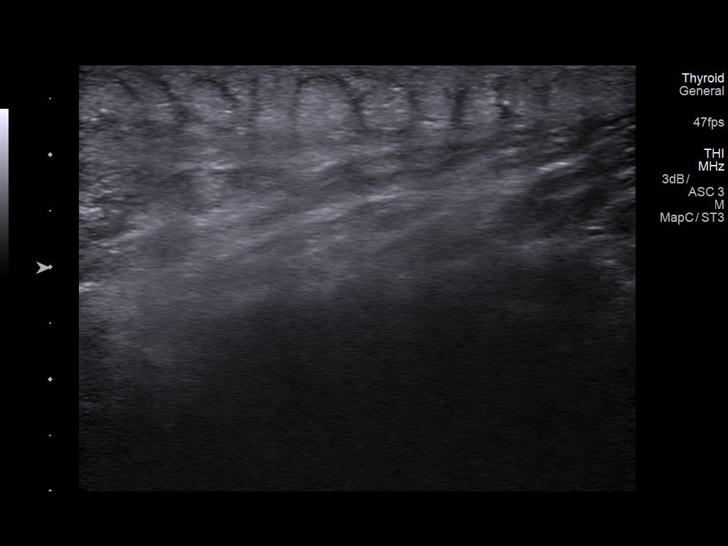
[im 15/15]
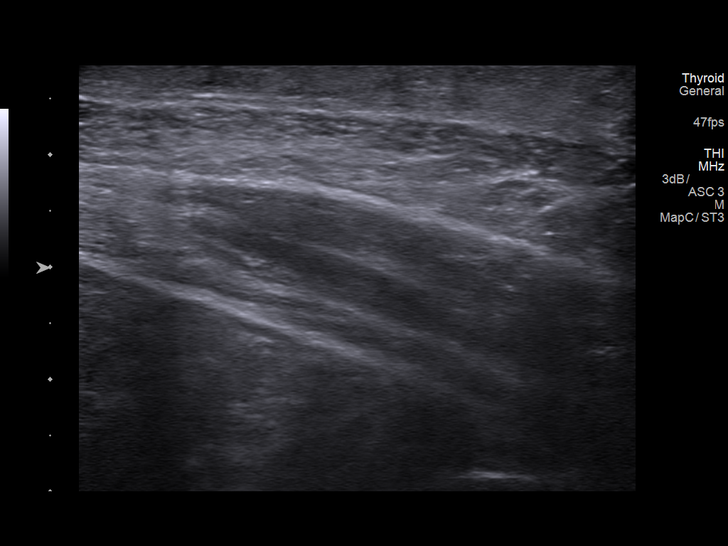

[14 of 15 positions shown; findings below may reference images not displayed]

FINDINGS: Specific evaluation of the left lower quadrant was requested. There
is no evidence of mass or focal fluid collection in the region. One
could question mild edema the superficial subcutaneous tissues which
is nonspecific.
IMPRESSION: No left lower quadrant abnormality identified internally. One could
question mild nonspecific edema the left lower quadrant subcutaneous
fat.

## 2019-10-15 NOTE — Telephone Encounter (Signed)
[name] [name] is a 39 year old male with a medical history significant for sickle cell disease, stage 4 chronic kidney disease, chronic pain syndrome, opiate dependence and tolerance found to have a hemoglobin of 6.7, which is consistent with his baseline.  However, patient was scheduled to receive Aranesp injections every other week, but has been lost to follow-up.  Will schedule patient for Aranesp on 10/16/2019.  Patient advised to follow-up with hematologist as scheduled.  Creatinine remains elevated and consistent with patient's baseline.  He is followed closely by nephrology.  Advised to continue to avoid all nephrotoxic medications.  [name]  APRN, MSN, FNP-C Patient [facility] [address] [place], [place] [address] [phone]

## 2019-10-16 ENCOUNTER — Telehealth: Payer: Self-pay

## 2019-10-16 NOTE — Telephone Encounter (Signed)
Called and spoke with patient, informed about injection. He stated that he will call back an get the appointment scheduled

## 2019-10-16 NOTE — Telephone Encounter (Addendum)
Appointment scheduled on 10-17-2019 @8am 

## 2019-10-17 ENCOUNTER — Other Ambulatory Visit: Payer: Self-pay | Admitting: Family Medicine

## 2019-10-17 ENCOUNTER — Other Ambulatory Visit: Payer: Self-pay

## 2019-10-17 ENCOUNTER — Ambulatory Visit (HOSPITAL_COMMUNITY)
Admission: RE | Admit: 2019-10-17 | Discharge: 2019-10-17 | Disposition: A | Discharge status: Home / Self Care | Payer: Medicare Other | Attending: Internal Medicine | Admitting: Internal Medicine

## 2019-10-17 DIAGNOSIS — D571 Sickle-cell disease without crisis: Secondary | ICD-10-CM | POA: Insufficient documentation

## 2019-10-17 LAB — CBC
HCT: 20.2 % — ABNORMAL LOW (ref 39.0–52.0)
Hemoglobin: 6.5 g/dL — CL (ref 13.0–17.0)
MCH: 30.2 pg (ref 26.0–34.0)
MCHC: 32.2 g/dL (ref 30.0–36.0)
MCV: 94 fL (ref 80.0–100.0)
Platelets: 295 10*3/uL (ref 150–400)
RBC: 2.15 MIL/uL — ABNORMAL LOW (ref 4.22–5.81)
RDW: 24.1 % — ABNORMAL HIGH (ref 11.5–15.5)
WBC: 17.1 10*3/uL — ABNORMAL HIGH (ref 4.0–10.5)
nRBC: 0.1 % (ref 0.0–0.2)

## 2019-10-17 MED ORDER — DARBEPOETIN ALFA 150 MCG/0.3ML IJ SOSY
150.0000 ug | PREFILLED_SYRINGE | Freq: Once | INTRAMUSCULAR | Status: AC
  Administered 2019-10-17: 150 ug via SUBCUTANEOUS
  Filled 2019-10-17: qty 0.3

## 2019-10-17 NOTE — Progress Notes (Signed)
CRITICAL VALUE ALERT  Critical Value:  Hemoglobin 6.5  Date & Time Notied:  10/17/19 at 9:15 am  Provider Notified: Hollis, [place], [place]  Orders Received/Actions taken: Patient to receive Aranesp injection

## 2019-10-17 NOTE — Progress Notes (Signed)
Mateusz Neilan is a 39 year old male with a medical history significant for sickle cell disease type SS, chronic pain syndrome, opiate dependence and tolerance, and stage IV chronic kidney disease presents to sickle cell day clinic for darbepoetin injection.  Patient is scheduled to receive injections every 2 weeks per hematology.  He has been lost to follow-up over the past several weeks.  Will restart today.  Will hold injection if hemoglobin is greater than 8.0 g/dL.  [name]  APRN, MSN, FNP-C Patient [facility] [address] [place], [place] [address] [phone]

## 2019-10-17 NOTE — Discharge Instructions (Signed)
Darbepoetin Alfa injection What is this medicine? DARBEPOETIN ALFA (dar be POE e tin AL fa) helps your body make more red blood cells. It is used to treat anemia caused by chronic kidney failure and chemotherapy. This medicine may be used for other purposes; ask your health care provider or pharmacist if you have questions. COMMON BRAND NAME(S): Aranesp What should I tell my health care provider before I take this medicine? They need to know if you have any of these conditions:  blood clotting disorders or history of blood clots  cancer patient not on chemotherapy  cystic fibrosis  heart disease, such as angina, heart failure, or a history of a heart attack  hemoglobin level of 12 g/dL or greater  high blood pressure  low levels of folate, iron, or vitamin B12  seizures  an unusual or allergic reaction to darbepoetin, erythropoietin, albumin, hamster proteins, latex, other medicines, foods, dyes, or preservatives  pregnant or trying to get pregnant  breast-feeding How should I use this medicine? This medicine is for injection into a vein or under the skin. It is usually given by a health care professional in a hospital or clinic setting. If you get this medicine at home, you will be taught how to prepare and give this medicine. Use exactly as directed. Take your medicine at regular intervals. Do not take your medicine more often than directed. It is important that you put your used needles and syringes in a special sharps container. Do not put them in a trash can. If you do not have a sharps container, call your pharmacist or healthcare provider to get one. A special MedGuide will be given to you by the pharmacist with each prescription and refill. Be sure to read this information carefully each time. Talk to your pediatrician regarding the use of this medicine in children. While this medicine may be used in children as young as 1 month of age for selected conditions, precautions do  apply. Overdosage: If you think you have taken too much of this medicine contact a poison control center or emergency room at once. NOTE: This medicine is only for you. Do not share this medicine with others. What if I miss a dose? If you miss a dose, take it as soon as you can. If it is almost time for your next dose, take only that dose. Do not take double or extra doses. What may interact with this medicine? Do not take this medicine with any of the following medications:  epoetin alfa This list may not describe all possible interactions. Give your health care provider a list of all the medicines, herbs, non-prescription drugs, or dietary supplements you use. Also tell them if you smoke, drink alcohol, or use illegal drugs. Some items may interact with your medicine. What should I watch for while using this medicine? Your condition will be monitored carefully while you are receiving this medicine. You may need blood work done while you are taking this medicine. This medicine may cause a decrease in vitamin B6. You should make sure that you get enough vitamin B6 while you are taking this medicine. Discuss the foods you eat and the vitamins you take with your health care professional. What side effects may I notice from receiving this medicine? Side effects that you should report to your doctor or health care professional as soon as possible:  allergic reactions like skin rash, itching or hives, swelling of the face, lips, or tongue  breathing problems  changes in   vision  chest pain  confusion, trouble speaking or understanding  feeling faint or lightheaded, falls  high blood pressure  muscle aches or pains  pain, swelling, warmth in the leg  rapid weight gain  severe headaches  sudden numbness or weakness of the face, arm or leg  trouble walking, dizziness, loss of balance or coordination  seizures (convulsions)  swelling of the ankles, feet, hands  unusually weak or  tired Side effects that usually do not require medical attention (report to your doctor or health care professional if they continue or are bothersome):  diarrhea  fever, chills (flu-like symptoms)  headaches  nausea, vomiting  redness, stinging, or swelling at site where injected This list may not describe all possible side effects. Call your doctor for medical advice about side effects. You may report side effects to FDA at 1-800-FDA-1088. Where should I keep my medicine? Keep out of the reach of children. Store in a refrigerator between 2 and 8 degrees C (36 and 46 degrees F). Do not freeze. Do not shake. Throw away any unused portion if using a single-dose vial. Throw away any unused medicine after the expiration date. NOTE: This sheet is a summary. It may not cover all possible information. If you have questions about this medicine, talk to your doctor, pharmacist, or health care provider.  2020 Elsevier/Gold Standard (2017-02-14 16:44:20)  

## 2019-10-17 NOTE — Progress Notes (Signed)
PATIENT CARE CENTER NOTE  Diagnosis: Sickle Cell Anemia    Provider: Hollis, [place], FNP   Procedure: Aranesp 150 mcg injection   Note: Patient received Aranesp injection in left lower abdomen. Pre-injection Hemoglobin 6.5. Patient tolerated injection well with no adverse reaction. Vital signs stable. Patient refused AVS. Patient alert, oriented and ambulatory with cane at discharge.

## 2019-10-22 ENCOUNTER — Other Ambulatory Visit: Payer: Self-pay | Admitting: Family Medicine

## 2019-10-22 ENCOUNTER — Telehealth: Payer: Self-pay | Admitting: Internal Medicine

## 2019-10-22 MED ORDER — VITAMIN D3 50 MCG (2000 UT) PO TABS: 100.0000 mg | ORAL_TABLET | Freq: Every day | ORAL | 11 refills | Status: DC

## 2019-10-22 MED ORDER — VITAMIN D3 50 MCG (2000 UT) PO TABS: 1.0000 | ORAL_TABLET | Freq: Every day | ORAL | 11 refills | Status: DC

## 2019-10-22 NOTE — Telephone Encounter (Signed)
rx refill vitamin D3 send to [facility] on [address]

## 2019-10-22 NOTE — Progress Notes (Signed)
Meds ordered this encounter  Medications  . Cholecalciferol (VITAMIN D3) 50 MCG (2000 UT) TABS    Sig: Take 100 mg by mouth daily.    Dispense:  30 tablet    Refill:  11    Order Specific Question:   Supervising Provider    Answer:   [name] [[id number]]

## 2019-10-29 ENCOUNTER — Telehealth: Payer: Self-pay | Admitting: Family Medicine

## 2019-10-30 ENCOUNTER — Telehealth: Payer: Self-pay | Admitting: Family Medicine

## 2019-10-30 ENCOUNTER — Other Ambulatory Visit: Payer: Self-pay | Admitting: Family Medicine

## 2019-10-30 DIAGNOSIS — G894 Chronic pain syndrome: Secondary | ICD-10-CM

## 2019-10-30 MED ORDER — OXYCODONE HCL 10 MG PO TABS: 10.0000 mg | ORAL_TABLET | Freq: Four times a day (QID) | ORAL | 0 refills | Status: DC | PRN

## 2019-10-30 MED FILL — oxyCODONE HCL 10 MG TABS: 10 | 15 days supply | Qty: 60 | Fill #0

## 2019-10-30 NOTE — Telephone Encounter (Signed)
Patient would like a call from [name] [name] ASAP regarding oxycodone refill. [phone]

## 2019-10-30 NOTE — Progress Notes (Signed)
Reviewed PDMP substance reporting system prior to prescribing opiate medications. No inconsistencies noted.  Meds ordered this encounter  Medications   Oxycodone HCl 10 MG TABS    Sig: Take 1 tablet (10 mg total) by mouth every 6 (six) hours as needed.    Dispense:  60 tablet    Refill:  0    Order Specific Question:   Supervising Provider    Answer:   [name], [name] [[id number]]   [name] Kohles Moore Rory Montel  APRN, MSN, FNP-C [facility] [address]  [place], [place] [address] [phone]  

## 2019-10-31 ENCOUNTER — Other Ambulatory Visit: Payer: Self-pay | Admitting: Family Medicine

## 2019-10-31 ENCOUNTER — Telehealth: Payer: Self-pay | Admitting: Family Medicine

## 2019-10-31 DIAGNOSIS — G894 Chronic pain syndrome: Secondary | ICD-10-CM

## 2019-10-31 DIAGNOSIS — D571 Sickle-cell disease without crisis: Secondary | ICD-10-CM

## 2019-10-31 MED ORDER — OXYCODONE HCL ER 15 MG PO T12A: 15.0000 mg | EXTENDED_RELEASE_TABLET | Freq: Two times a day (BID) | ORAL | 0 refills | Status: DC

## 2019-10-31 MED FILL — OxyCONTIN 15 MG T12A: 15 | 30 days supply | Qty: 60 | Fill #0

## 2019-10-31 NOTE — Telephone Encounter (Signed)
holl 

## 2019-10-31 NOTE — Telephone Encounter (Signed)
Done

## 2019-10-31 NOTE — Progress Notes (Signed)
Reviewed PDMP substance reporting system prior to prescribing opiate medications. No inconsistencies noted.  Meds ordered this encounter  Medications   oxyCODONE (OXYCONTIN) 15 mg 12 hr tablet    Sig: Take 1 tablet (15 mg total) by mouth every 12 (twelve) hours.    Dispense:  60 tablet    Refill:  0    Order Specific Question:   Supervising Provider    Answer:   [name], [name] [[id number]]   [name] Kampa Moore Ching Rabideau  APRN, MSN, FNP-C [facility] [address]  [place], [place] [address] [phone]  

## 2019-11-04 ENCOUNTER — Telehealth: Payer: Self-pay

## 2019-11-04 NOTE — Telephone Encounter (Signed)
Patient is aware 

## 2019-11-06 ENCOUNTER — Other Ambulatory Visit: Payer: Self-pay | Admitting: Family Medicine

## 2019-11-06 DIAGNOSIS — Z8669 Personal history of other diseases of the nervous system and sense organs: Secondary | ICD-10-CM

## 2019-11-06 DIAGNOSIS — G894 Chronic pain syndrome: Secondary | ICD-10-CM

## 2019-11-11 DIAGNOSIS — E790 Hyperuricemia without signs of inflammatory arthritis and tophaceous disease: Secondary | ICD-10-CM | POA: Diagnosis not present

## 2019-11-11 DIAGNOSIS — G8929 Other chronic pain: Secondary | ICD-10-CM | POA: Diagnosis not present

## 2019-11-11 DIAGNOSIS — R7989 Other specified abnormal findings of blood chemistry: Secondary | ICD-10-CM | POA: Diagnosis not present

## 2019-11-11 DIAGNOSIS — Z7689 Persons encountering health services in other specified circumstances: Secondary | ICD-10-CM | POA: Diagnosis not present

## 2019-11-11 DIAGNOSIS — D649 Anemia, unspecified: Secondary | ICD-10-CM | POA: Diagnosis not present

## 2019-11-11 DIAGNOSIS — M25474 Effusion, right foot: Secondary | ICD-10-CM | POA: Diagnosis not present

## 2019-11-11 DIAGNOSIS — M25561 Pain in right knee: Secondary | ICD-10-CM | POA: Diagnosis not present

## 2019-11-11 DIAGNOSIS — Z79899 Other long term (current) drug therapy: Secondary | ICD-10-CM | POA: Diagnosis not present

## 2019-11-11 DIAGNOSIS — N183 Chronic kidney disease, stage 3 unspecified: Secondary | ICD-10-CM | POA: Diagnosis not present

## 2019-11-11 DIAGNOSIS — D571 Sickle-cell disease without crisis: Secondary | ICD-10-CM | POA: Diagnosis not present

## 2019-11-18 ENCOUNTER — Other Ambulatory Visit: Payer: Self-pay

## 2019-11-18 DIAGNOSIS — G894 Chronic pain syndrome: Secondary | ICD-10-CM

## 2019-11-18 DIAGNOSIS — D571 Sickle-cell disease without crisis: Secondary | ICD-10-CM

## 2019-11-19 DIAGNOSIS — E872 Acidosis: Secondary | ICD-10-CM | POA: Diagnosis not present

## 2019-11-19 DIAGNOSIS — E559 Vitamin D deficiency, unspecified: Secondary | ICD-10-CM | POA: Diagnosis not present

## 2019-11-19 DIAGNOSIS — N051 Unspecified nephritic syndrome with focal and segmental glomerular lesions: Secondary | ICD-10-CM | POA: Diagnosis not present

## 2019-11-19 DIAGNOSIS — D631 Anemia in chronic kidney disease: Secondary | ICD-10-CM | POA: Diagnosis not present

## 2019-11-19 DIAGNOSIS — N1832 Chronic kidney disease, stage 3b: Secondary | ICD-10-CM | POA: Diagnosis not present

## 2019-11-20 DIAGNOSIS — M87059 Idiopathic aseptic necrosis of unspecified femur: Secondary | ICD-10-CM | POA: Diagnosis not present

## 2019-11-24 ENCOUNTER — Other Ambulatory Visit: Payer: Self-pay | Admitting: Family Medicine

## 2019-11-24 DIAGNOSIS — D571 Sickle-cell disease without crisis: Secondary | ICD-10-CM

## 2019-11-24 DIAGNOSIS — G894 Chronic pain syndrome: Secondary | ICD-10-CM

## 2019-11-24 MED ORDER — OXYCODONE HCL ER 15 MG PO T12A: 15.0000 mg | EXTENDED_RELEASE_TABLET | Freq: Two times a day (BID) | ORAL | 0 refills | Status: DC

## 2019-11-24 MED ORDER — OXYCODONE HCL 10 MG PO TABS: 10.0000 mg | ORAL_TABLET | Freq: Four times a day (QID) | ORAL | 0 refills | Status: DC | PRN

## 2019-11-24 NOTE — Progress Notes (Signed)
Reviewed PDMP substance reporting system prior to prescribing opiate medications. No inconsistencies noted.   Meds ordered this encounter  Medications  . Oxycodone HCl 10 MG TABS    Sig: Take 1 tablet (10 mg total) by mouth every 6 (six) hours as needed.    Dispense:  60 tablet    Refill:  0    Order Specific Question:   Supervising Provider    Answer:   [name] [id number]  . oxyCODONE (OXYCONTIN) 15 mg 12 hr tablet    Sig: Take 1 tablet (15 mg total) by mouth every 12 (twelve) hours.    Dispense:  60 tablet    Refill:  0    Order Specific Question:   Supervising Provider    Answer:   [name] [[id number]]     [name]  APRN, MSN, FNP-C Patient [facility] [address] [place], [place] [address] [phone]

## 2019-11-25 ENCOUNTER — Other Ambulatory Visit: Payer: Self-pay | Admitting: Family Medicine

## 2019-11-25 ENCOUNTER — Telehealth: Payer: Self-pay | Admitting: Family Medicine

## 2019-11-26 ENCOUNTER — Other Ambulatory Visit: Payer: Medicare Other

## 2019-11-26 NOTE — Telephone Encounter (Signed)
Done

## 2019-12-01 ENCOUNTER — Other Ambulatory Visit: Payer: Self-pay | Admitting: Family Medicine

## 2019-12-01 ENCOUNTER — Observation Stay (HOSPITAL_COMMUNITY)
Admission: AD | Admit: 2019-12-01 | Discharge: 2019-12-02 | Disposition: A | Discharge status: Home / Self Care | Payer: Medicare Other | Source: Ambulatory Visit | Attending: Internal Medicine | Admitting: Internal Medicine

## 2019-12-01 ENCOUNTER — Other Ambulatory Visit: Payer: Self-pay

## 2019-12-01 ENCOUNTER — Ambulatory Visit (HOSPITAL_COMMUNITY)
Admission: RE | Admit: 2019-12-01 | Discharge: 2019-12-01 | Disposition: A | Discharge status: Home / Self Care | Payer: Medicare Other | Attending: Internal Medicine | Admitting: Internal Medicine

## 2019-12-01 ENCOUNTER — Other Ambulatory Visit: Payer: Medicare Other

## 2019-12-01 ENCOUNTER — Encounter (HOSPITAL_COMMUNITY): Payer: Self-pay | Admitting: Family Medicine

## 2019-12-01 DIAGNOSIS — F17210 Nicotine dependence, cigarettes, uncomplicated: Secondary | ICD-10-CM | POA: Diagnosis not present

## 2019-12-01 DIAGNOSIS — D649 Anemia, unspecified: Secondary | ICD-10-CM | POA: Diagnosis not present

## 2019-12-01 DIAGNOSIS — Z20822 Contact with and (suspected) exposure to covid-19: Secondary | ICD-10-CM | POA: Insufficient documentation

## 2019-12-01 DIAGNOSIS — D5700 Hb-SS disease with crisis, unspecified: Principal | ICD-10-CM | POA: Insufficient documentation

## 2019-12-01 DIAGNOSIS — N184 Chronic kidney disease, stage 4 (severe): Secondary | ICD-10-CM | POA: Diagnosis present

## 2019-12-01 DIAGNOSIS — Z79899 Other long term (current) drug therapy: Secondary | ICD-10-CM | POA: Diagnosis not present

## 2019-12-01 DIAGNOSIS — D571 Sickle-cell disease without crisis: Secondary | ICD-10-CM | POA: Diagnosis not present

## 2019-12-01 DIAGNOSIS — D72829 Elevated white blood cell count, unspecified: Secondary | ICD-10-CM | POA: Diagnosis not present

## 2019-12-01 DIAGNOSIS — D638 Anemia in other chronic diseases classified elsewhere: Secondary | ICD-10-CM

## 2019-12-01 DIAGNOSIS — G894 Chronic pain syndrome: Secondary | ICD-10-CM | POA: Diagnosis not present

## 2019-12-01 LAB — CBC
HCT: 16.1 % — ABNORMAL LOW (ref 39.0–52.0)
Hemoglobin: 5.5 g/dL — CL (ref 13.0–17.0)
MCH: 35 pg — ABNORMAL HIGH (ref 26.0–34.0)
MCHC: 34.2 g/dL (ref 30.0–36.0)
MCV: 102.5 fL — ABNORMAL HIGH (ref 80.0–100.0)
Platelets: 223 10*3/uL (ref 150–400)
RBC: 1.57 MIL/uL — ABNORMAL LOW (ref 4.22–5.81)
RDW: 28.5 % — ABNORMAL HIGH (ref 11.5–15.5)
WBC: 20.7 10*3/uL — ABNORMAL HIGH (ref 4.0–10.5)
nRBC: 0.1 % (ref 0.0–0.2)

## 2019-12-01 LAB — URINALYSIS, ROUTINE W REFLEX MICROSCOPIC
Bilirubin Urine: NEGATIVE
Glucose, UA: 50 mg/dL — AB
Hgb urine dipstick: NEGATIVE
Ketones, ur: NEGATIVE mg/dL
Leukocytes,Ua: NEGATIVE
Nitrite: NEGATIVE
Protein, ur: >=300 mg/dL — AB
Specific Gravity, Urine: 1.008 (ref 1.005–1.030)
pH: 7 (ref 5.0–8.0)

## 2019-12-01 LAB — RAPID URINE DRUG SCREEN, HOSP PERFORMED
Amphetamines: NOT DETECTED
Barbiturates: NOT DETECTED
Benzodiazepines: NOT DETECTED
Cocaine: NOT DETECTED
Opiates: POSITIVE — AB
Tetrahydrocannabinol: POSITIVE — AB

## 2019-12-01 LAB — RESP PANEL BY RT PCR (RSV, FLU A&B, COVID)
Influenza A by PCR: NEGATIVE
Influenza B by PCR: NEGATIVE
Respiratory Syncytial Virus by PCR: NEGATIVE
SARS Coronavirus 2 by RT PCR: NEGATIVE

## 2019-12-01 LAB — PREPARE RBC (CROSSMATCH)

## 2019-12-01 MED ORDER — VITAMIN D3 25 MCG (1000 UNIT) PO TABS
2000.0000 [IU] | ORAL_TABLET | Freq: Every day | ORAL | Status: DC
  Administered 2019-12-02: 2000 [IU] via ORAL
  Filled 2019-12-01 (×2): qty 2

## 2019-12-01 MED ORDER — SODIUM BICARBONATE 650 MG PO TABS
650.0000 mg | ORAL_TABLET | Freq: Two times a day (BID) | ORAL | Status: DC
  Administered 2019-12-01 – 2019-12-02 (×2): 650 mg via ORAL
  Filled 2019-12-01 (×2): qty 1

## 2019-12-01 MED ORDER — FOLIC ACID 1 MG PO TABS
1.0000 mg | ORAL_TABLET | Freq: Every day | ORAL | Status: DC
  Administered 2019-12-02: 1 mg via ORAL
  Filled 2019-12-01 (×2): qty 1

## 2019-12-01 MED ORDER — DARBEPOETIN ALFA 100 MCG/0.5ML IJ SOSY
100.0000 ug | PREFILLED_SYRINGE | Freq: Once | INTRAMUSCULAR | Status: AC
  Administered 2019-12-01: 100 ug via SUBCUTANEOUS

## 2019-12-01 MED ORDER — ONDANSETRON HCL 4 MG/2ML IJ SOLN: 4.0000 mg | Freq: Four times a day (QID) | INTRAMUSCULAR | Status: DC | PRN

## 2019-12-01 MED ORDER — DIPHENHYDRAMINE HCL 25 MG PO CAPS
25.0000 mg | ORAL_CAPSULE | Freq: Once | ORAL | Status: AC
  Administered 2019-12-01: 25 mg via ORAL
  Filled 2019-12-01: qty 1

## 2019-12-01 MED ORDER — DARBEPOETIN ALFA 100 MCG/0.5ML IJ SOSY
100.0000 ug | PREFILLED_SYRINGE | Freq: Once | INTRAMUSCULAR | Status: DC
  Filled 2019-12-01: qty 0.5

## 2019-12-01 MED ORDER — SENNOSIDES-DOCUSATE SODIUM 8.6-50 MG PO TABS
1.0000 | ORAL_TABLET | Freq: Two times a day (BID) | ORAL | Status: DC
  Administered 2019-12-02: 1 via ORAL
  Filled 2019-12-01 (×2): qty 1

## 2019-12-01 MED ORDER — HYDROMORPHONE HCL 2 MG PO TABS
2.0000 mg | ORAL_TABLET | Freq: Four times a day (QID) | ORAL | Status: AC | PRN
  Administered 2019-12-01: 2 mg via ORAL
  Filled 2019-12-01: qty 1

## 2019-12-01 MED ORDER — SODIUM CHLORIDE 0.45 % IV SOLN: INTRAVENOUS | Status: DC

## 2019-12-01 MED ORDER — NORTRIPTYLINE HCL 25 MG PO CAPS
25.0000 mg | ORAL_CAPSULE | Freq: Two times a day (BID) | ORAL | Status: DC
  Administered 2019-12-01 – 2019-12-02 (×2): 25 mg via ORAL
  Filled 2019-12-01 (×2): qty 1

## 2019-12-01 MED ORDER — HYDROMORPHONE 1 MG/ML IV SOLN
INTRAVENOUS | Status: DC
  Administered 2019-12-01: 0 mg via INTRAVENOUS
  Administered 2019-12-01: 5.5 mg via INTRAVENOUS
  Administered 2019-12-01: 30 mg via INTRAVENOUS
  Administered 2019-12-02: 1.5 mg via INTRAVENOUS
  Administered 2019-12-02: 1 mg via INTRAVENOUS
  Filled 2019-12-01: qty 30

## 2019-12-01 MED ORDER — SODIUM CHLORIDE 0.9% FLUSH: 9.0000 mL | INTRAVENOUS | Status: DC | PRN

## 2019-12-01 MED ORDER — OXYCODONE HCL ER 15 MG PO T12A
15.0000 mg | EXTENDED_RELEASE_TABLET | Freq: Two times a day (BID) | ORAL | Status: DC
  Administered 2019-12-01 – 2019-12-02 (×2): 15 mg via ORAL
  Filled 2019-12-01 (×2): qty 1

## 2019-12-01 MED ORDER — SODIUM CHLORIDE 0.9% IV SOLUTION: Freq: Once | INTRAVENOUS | Status: AC

## 2019-12-01 MED ORDER — NALOXONE HCL 0.4 MG/ML IJ SOLN: 0.4000 mg | INTRAMUSCULAR | Status: DC | PRN

## 2019-12-01 MED ORDER — POLYETHYLENE GLYCOL 3350 17 G PO PACK: 17.0000 g | PACK | Freq: Every day | ORAL | Status: DC | PRN

## 2019-12-01 NOTE — Discharge Instructions (Signed)
Sickle Cell Anemia, Adult  Sickle cell anemia is a condition where your red blood cells are shaped like sickles. Red blood cells carry oxygen through the body. Sickle-shaped cells do not live as long as normal red blood cells. They also clump together and block blood from flowing through the blood vessels. This prevents the body from getting enough oxygen. Sickle cell anemia causes organ damage and pain. It also increases the risk of infection. Follow these instructions at home: Medicines  Take over-the-counter and prescription medicines only as told by your doctor.  If you were prescribed an antibiotic medicine, take it as told by your doctor. Do not stop taking the antibiotic even if you start to feel better.  If you develop a fever, do not take medicines to lower the fever right away. Tell your doctor about the fever. Managing pain, stiffness, and swelling  Try these methods to help with pain: ? Use a heating pad. ? Take a warm bath. ? Distract yourself, such as by watching TV. Eating and drinking  Drink enough fluid to keep your pee (urine) clear or pale yellow. Drink more in hot weather and during exercise.  Limit or avoid alcohol.  Eat a healthy diet. Eat plenty of fruits, vegetables, whole grains, and lean protein.  Take vitamins and supplements as told by your doctor. Traveling  When traveling, keep these with you: ? Your medical information. ? The names of your doctors. ? Your medicines.  If you need to take an airplane, talk to your doctor first. Activity  Rest often.  Avoid exercises that make your heart beat much faster, such as jogging. General instructions  Do not use products that have nicotine or tobacco, such as cigarettes and e-cigarettes. If you need help quitting, ask your doctor.  Consider wearing a medical alert bracelet.  Avoid being in high places (high altitudes), such as mountains.  Avoid very hot or cold temperatures.  Avoid places where the  temperature changes a lot.  Keep all follow-up visits as told by your doctor. This is important. Contact a doctor if:  A joint hurts.  Your feet or hands hurt or swell.  You feel tired (fatigued). Get help right away if:  You have symptoms of infection. These include: ? Fever. ? Chills. ? Being very tired. ? Irritability. ? Poor eating. ? Throwing up (vomiting).  You feel dizzy or faint.  You have new stomach pain, especially on the left side.  You have a an erection (priapism) that lasts more than 4 hours.  You have numbness in your arms or legs.  You have a hard time moving your arms or legs.  You have trouble talking.  You have pain that does not go away when you take medicine.  You are short of breath.  You are breathing fast.  You have a long-term cough.  You have pain in your chest.  You have a bad headache.  You have a stiff neck.  Your stomach looks bloated even though you did not eat much.  Your skin is pale.  You suddenly cannot see well. Summary  Sickle cell anemia is a condition where your red blood cells are shaped like sickles.  Follow your doctor's advice on ways to manage pain, food to eat, activities to do, and steps to take for safe travel.  Get medical help right away if you have any signs of infection, such as a fever. This information is not intended to replace advice given to you by   your health care provider. Make sure you discuss any questions you have with your health care provider. Document Revised: 05/24/2018 Document Reviewed: 03/07/2016 Elsevier Patient Education  2020 [employer]  

## 2019-12-01 NOTE — Progress Notes (Signed)
Patient admitted to the day hospital for treatment of sickle cell pain crisis. Patient reported pain rated 8/10 in the legs . Placed on Dilaudid PCA, given PO Benadryl and hydrated with IV fluids. Patient also received sub q Aranesp. Hemoglobin today was 5.5. Patient transferred to [place] room 13. Report was given to [facility]. Pain on transfer was  7/10. Patient alert, oriented and transported in a wheelchair.

## 2019-12-01 NOTE — Progress Notes (Signed)
[name] [name] is a 39 year old male with a medical history significant for sickle cell disease type SS, chronic pain syndrome, CKD stage IV, and history of anemia of chronic disease presents for biweekly darbepoetin injection.  Patient is followed by [facility] hematology.  He is scheduled every 2 weeks for darbepoetin 100 mcg.  If hemoglobin is greater than 8.0 g/dL, hold darbepoetin per hematology.  [name]  APRN, MSN, FNP-C Patient [place] [address] [place], [place] [address] [phone]

## 2019-12-01 NOTE — H&P (Signed)
H&P  Patient Demographics:  [name] [name], is a 39 y.o. male  MRN: [id number]   DOB - [birth year]  Admit Date - 12/01/2019  Outpatient Primary MD for the patient is [name], MD      HPI:   [name] [name] is a 39 year old male with a medical history significant for sickle cell anemia type SS, chronic kidney disease stage IV, history of symptomatic anemia, opiate dependence and tolerance, and history of polysubstance abuse presents complaining of bilateral lower extremity pain that is consistent with previous sickle cell pain crisis.  Patient presented this a.m. for routine darbepoetin injection.  Patient complained of increased pain, shortness of breath, and fatigue over the past 2 days.  Pain has not improved on home medication.  He last had oxycodone earlier this a.m. without sustained relief.  Pain intensity is 8/10 characterized as constant and throbbing. Patient's hemoglobin is 5.5, below his baseline of 6-7 g/dL.  He denies headache, chest pain, dizziness, urinary symptoms, nausea, vomiting, or diarrhea.  No sick contacts, recent travel, or exposure to COVID-19.  Of note, patient fully vaccinated.    Sickle cell day infusion course:  Vital signs show: Temperature 98.8, BP 138/67, Pulse 107, HR 89, and oxygen saturation 95% on RA. Hemoglobin 5.5, which is below patient's baseline of 6-7 g/dL, WBCs 14.7, potassium 5.2, and creatinine 2.42.  Pain persists despite IV fluids and IV Dilaudid.  Patient admitted for management of symptomatic anemia in the setting of sickle cell pain crisis.  Review of systems:  In addition to the HPI above, patient reports  Review of Systems  Constitutional: Negative.   HENT: Negative.   Eyes: Negative.   Respiratory: Negative.   Cardiovascular: Negative.   Gastrointestinal: Negative.   Genitourinary: Negative.  Negative for frequency and hematuria.  Musculoskeletal: Positive for back pain and joint pain.  Skin: Negative.   Neurological:  Negative.   Psychiatric/Behavioral: Negative.  Negative for hallucinations and substance abuse.     A full 10 point Review of Systems was done, except as stated above, all other Review of Systems were negative.  With Past History of the following :   Past Medical History:  Diagnosis Date  . Abscess of right iliac fossa 09/24/2018   required Perc Drain 09/24/2018  . Arachnoid Cyst of brain bilaterally    "2 really small ones in the back of my head; inside; saw them w/MRI" (09/25/2012)  . Bacterial pneumonia ~ 2012   "caught it here in the hospital" (09/25/2012)  . Chronic kidney disease    "from my sickle cell" (09/25/2012)  . CKD (chronic kidney disease), stage II   . Colitis 04/19/2017   CT scan abd/ pelvis  . GERD (gastroesophageal reflux disease)    "after I eat alot of spicey foods" (09/25/2012)  . Gynecomastia, male 07/10/2012  . History of blood transfusion    "always related to sickle cell crisis" (09/25/2012)  . 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   Noted in noted from H[facility] at [facility]  . Migraines    "take RX qd to prevent them" (09/25/2012)  . Opioid dependence with withdrawal ([place]) 08/30/2012  . Renal insufficiency   . Sickle cell anemia (HCC)   . Sickle cell crisis ([place]) 09/25/2012  . Sickle cell nephropathy ([place]) 07/10/2012  . Sinus tachycardia   . Tachycardia with heart rate 121-140 beats per minute with ambulation 08/04/2016      Past Surgical History:  Procedure Laterality Date  . ABCESS DRAINAGE    .  CHOLECYSTECTOMY  ~ 2012  . COLONOSCOPY N/A 04/23/2017   Procedure: COLONOSCOPY;  Surgeon: Irene Shipper, MD;  Location: [facility];  Service: Endoscopy;  Laterality: N/A;  . IR FLUORO GUIDE CV LINE RIGHT  12/17/2016  . IR RADIOLOGIST EVAL & MGMT  10/02/2018  . IR RADIOLOGIST EVAL & MGMT  10/15/2018  . IR REMOVAL TUN CV CATH W/O FL  12/21/2016  . IR US GUIDE VASC ACCESS RIGHT  12/17/2016  . spleenectomy       Social History:   Social  History   Tobacco Use  . Smoking status: Current Some Day Smoker    Packs/day: 0.10    Types: Cigarettes  . Smokeless tobacco: Never Used  . Tobacco comment: 09/25/2012 "I don't buy cigarettes; bum one from friends q now and then"  Substance Use Topics  . Alcohol use: Yes    Alcohol/week: 0.0 standard drinks    Comment: Once a month     Lives - At home   Family History :   Family History  Problem Relation Age of Onset  . Breast cancer Mother      Home Medications:   Prior to Admission medications   Medication Sig Start Date End Date Taking? Authorizing Provider  acetaminophen (TYLENOL) 500 MG tablet Take 1 tablet (500 mg total) by mouth every 6 (six) hours as needed. Patient taking differently: Take 500 mg by mouth every 6 (six) hours as needed for mild pain.  03/26/19  Yes [name], FNP  Cholecalciferol (VITAMIN D3) 50 MCG (2000 UT) TABS Take 1 each by mouth daily. 10/22/19  Yes [name], FNP  folic acid (FOLVITE) 1 MG tablet Take 1 tablet (1 mg total) by mouth daily. 03/11/19  Yes [name], FNP  LOKELMA 10 g PACK packet Take 1 packet by mouth daily. 11/24/19  Yes [provider]  nortriptyline (PAMELOR) 25 MG capsule TAKE 1 CAPSULE (25 MG TOTAL) BY MOUTH 2 (TWO) TIMES DAILY. 11/07/19  Yes [name], FNP  oxyCODONE (OXYCONTIN) 15 mg 12 hr tablet Take 1 tablet (15 mg total) by mouth every 12 (twelve) hours. 11/30/19 12/30/19 Yes [name], FNP  Oxycodone HCl 10 MG TABS Take 1 tablet (10 mg total) by mouth every 6 (six) hours as needed. Patient taking differently: Take 10 mg by mouth every 6 (six) hours as needed (pain).  11/24/19  Yes [name], FNP  sodium bicarbonate 650 MG tablet Take 650 mg by mouth 2 (two) times daily. 11/07/18  Yes [provider]     Allergies:   Allergies  Allergen Reactions  . Nsaids Other (See Comments)    Kidney disease  . Morphine And Related Other (See Comments)    "Real bad  headaches"      Physical Exam:   Vitals:   Vitals:   12/02/19 0800 12/02/19 1010  BP:  112/79  Pulse:  79  Resp: 16 13  Temp:  98.2 F (36.8 C)  SpO2: 97% 96%    Physical Exam: Constitutional: Patient appears well-developed and well-nourished. Not in obvious distress. HENT: Normocephalic, atraumatic, External right and left ear normal. Oropharynx is clear and moist.  Eyes: Conjunctivae and EOM are normal. PERRLA, no scleral icterus. Neck: Normal ROM. Neck supple. No JVD. No tracheal deviation. No thyromegaly. CVS: RRR, S1/S2 +, no murmurs, no gallops, no carotid bruit.  Pulmonary: Effort and breath sounds normal, no stridor, rhonchi, wheezes, rales.  Abdominal: Soft. BS +, no distension, tenderness,  rebound or guarding.  Musculoskeletal: Normal range of motion. No edema and no tenderness.  Lymphadenopathy: No lymphadenopathy noted, cervical, inguinal or axillary Neuro: Alert. Normal reflexes, muscle tone coordination. No cranial nerve deficit. Skin: Skin is warm and dry. No rash noted. Not diaphoretic. No erythema. No pallor. Psychiatric: Normal mood and affect. Behavior, judgment, thought content normal.   Data Review:   CBC Recent Labs  Lab 12/01/19 0958 12/02/19 0622  WBC 20.7* 14.7*  HGB 5.5* 8.5*  HCT 16.1* 23.9*  PLT 223 229  MCV 102.5* 91.6  MCH 35.0* 32.6  MCHC 34.2 35.6  RDW 28.5* 24.1*   ------------------------------------------------------------------------------------------------------------------  Chemistries  Recent Labs  Lab 12/02/19 0622  NA 136  K 5.2*  CL 107  CO2 18*  GLUCOSE 99  BUN 39*  CREATININE 2.38*  CALCIUM 9.0   ------------------------------------------------------------------------------------------------------------------ estimated creatinine clearance is 31.1 mL/min (A) (by C-G formula based on SCr of 2.38 mg/dL  (H)). ------------------------------------------------------------------------------------------------------------------ No results for input(s): TSH, T4TOTAL, T3FREE, THYROIDAB in the last 72 hours.  Invalid input(s): FREET3  Coagulation profile No results for input(s): INR, PROTIME in the last 168 hours. ------------------------------------------------------------------------------------------------------------------- No results for input(s): DDIMER in the last 72 hours. -------------------------------------------------------------------------------------------------------------------  Cardiac Enzymes No results for input(s): CKMB, TROPONINI, MYOGLOBIN in the last 168 hours.  Invalid input(s): CK ------------------------------------------------------------------------------------------------------------------ No results found for: BNP  ---------------------------------------------------------------------------------------------------------------  Urinalysis    Component Value Date/Time   COLORURINE YELLOW 12/01/2019 1859   APPEARANCEUR CLEAR 12/01/2019 1859   LABSPEC 1.008 12/01/2019 1859   PHURINE 7.0 12/01/2019 1859   GLUCOSEU 50 (A) 12/01/2019 1859   HGBUR NEGATIVE 12/01/2019 1859   HGBUR small 03/10/2010 [place] 12/01/2019 1859   BILIRUBINUR negative 10/14/2019 1030   KETONESUR NEGATIVE 12/01/2019 1859   PROTEINUR >=300 (A) 12/01/2019 1859   UROBILINOGEN 0.2 10/14/2019 1030   UROBILINOGEN 0.2 05/11/2017 1335   NITRITE NEGATIVE 12/01/2019 1859   LEUKOCYTESUR NEGATIVE 12/01/2019 1859    ----------------------------------------------------------------------------------------------------------------   Imaging Results:    No results found.   Assessment & Plan:  Principal Problem:   Sickle cell pain crisis (HCC) Active Problems:   CKD (chronic kidney disease), stage IV (HCC)   Leukocytosis   Symptomatic anemia   Chronic pain syndrome  Sickle  cell disease with pain crisis:  Admit patient, continue IV fluids 0.45% saline at 50 mL/h. Initiate IV Dilaudid PCA per weight-based protocol with settings of 0.5 mg, 10-minute lockout, and 3 mg/h. Continue OxyContin 20 mg every 12 hours.  Hold oxycodone, use PCA Dilaudid Hold Toradol due to history of CKD stage IV Monitor vital signs very closely, reevaluate pain scale regularly, and supplemental oxygen as needed.  Symptomatic anemia:  Hemoglobin is 5.5, which is below patient's baseline.  Transfuse 2 units PRBCs.  Follow labs in AM.  Leukocytosis:  WBCs elevated at 14.7.  More than likely secondary to vaso-occlusive pain crisis.  Patient is afebrile without any signs of infection or inflammation.  Continue to follow closely.  Chronic pain syndrome:  Continue home medications   Acute kidney injury superimposed on CKD IV:  Creatinine elevated at 2.4. Patient's baseline is 1.9-2.2. He is followed by nephrology as an outpatient.    DVT Prophylaxis: Subcut Lovenox   AM Labs Ordered, also please review Full Orders  Family Communication: Admission, patient's condition and plan of care including tests being ordered have been discussed with the patient who indicate understanding and agree with the plan and Code Status.  Code Status: Full Code  Consults called: None    Admission status: Inpatient    Time spent in minutes : 35 minutes  [place], MSN, FNP-C Patient [facility] [address] [place], [place] [address] [phone]  12/02/2019 at 6:04 PM

## 2019-12-01 NOTE — Progress Notes (Signed)
Attempted to finish up nursing admission history that was not completed. Pt states these "are stupid questions." States he does not want to answer them. Pt's rn notified. Lucius Conn BSN, RN-BC Admissions RN 12/01/2019 5:30 PM

## 2019-12-01 NOTE — Progress Notes (Signed)
Arrived to room for second IV site. Pt and RN report he is OK with midline for now. RN will re-enter consult if necessary.

## 2019-12-01 NOTE — Progress Notes (Signed)
CRITICAL VALUE ALERT  Critical Value:  Hemoglobin 5.5 Date & Time Notied:  12/01/19; 10:50 am  Provider Notified: Cammie Sickle FNP  Orders Received/Actions taken: packed red blood cells will be transfused.

## 2019-12-01 NOTE — H&P (Signed)
[facility] History and Physical   Date: 12/01/2019  Patient name: [name] [name]. Medical record number: [id number] Date of birth: 02-10-1981 Age: 39 y.o. Gender: male PCP: [name], MD  Attending physician: [name], MD  Chief Complaint: Sickle cell pain  History of Present Illness: [name] [name] is a 39 year old male with a medical history significant for sickle cell anemia type SS, chronic kidney disease stage IV, history of symptomatic anemia, opiate dependence and tolerance, and history of polysubstance abuse presents complaining of bilateral lower extremity pain that is consistent with previous sickle cell pain crisis.  Patient presented this a.m. for routine darbepoetin injection.  Patient complained of increased pain, shortness of breath, and fatigue over the past 2 days.  Pain has not improved on home medication.  He last had oxycodone earlier this a.m. without sustained relief.  Patient's hemoglobin is 5.5, below his baseline of 6-7 g/dL.  He denies headache, chest pain, dizziness, urinary symptoms, nausea, vomiting, or diarrhea.  No sick contacts, recent travel, or exposure to COVID-19.  Of note, patient fully vaccinated.  Patient admitted to sickle cell day infusion clinic for management of symptomatic anemia in the setting of sickle cell pain crisis.  Meds: Medications Prior to Admission  Medication Sig Dispense Refill Last Dose  . acetaminophen (TYLENOL) 500 MG tablet Take 1 tablet (500 mg total) by mouth every 6 (six) hours as needed. (Patient taking differently: Take 500 mg by mouth every 6 (six) hours as needed for mild pain. ) 30 tablet 0   . Cholecalciferol (VITAMIN D3) 50 MCG (2000 UT) TABS Take 1 each by mouth daily. 30 tablet 11   . folic acid (FOLVITE) 1 MG tablet Take 1 tablet (1 mg total) by mouth daily. 30 tablet 12   . gabapentin (NEURONTIN) 300 MG capsule Take 1 capsule (300 mg total) by mouth 2 (two) times daily as  needed. (Patient not taking: Reported on 06/12/2019) 60 capsule 1   . nortriptyline (PAMELOR) 25 MG capsule TAKE 1 CAPSULE (25 MG TOTAL) BY MOUTH 2 (TWO) TIMES DAILY. 60 capsule 1   . oxyCODONE (OXYCONTIN) 15 mg 12 hr tablet Take 1 tablet (15 mg total) by mouth every 12 (twelve) hours. 60 tablet 0   . Oxycodone HCl 10 MG TABS Take 1 tablet (10 mg total) by mouth every 6 (six) hours as needed. 60 tablet 0   . sodium bicarbonate 650 MG tablet Take 650 mg by mouth 2 (two) times daily.       Allergies: Nsaids and Morphine and related Past Medical History:  Diagnosis Date  . Abscess of right iliac fossa 09/24/2018   required Perc Drain 09/24/2018  . Arachnoid Cyst of brain bilaterally    "2 really small ones in the back of my head; inside; saw them w/MRI" (09/25/2012)  . Bacterial pneumonia ~ 2012   "caught it here in the hospital" (09/25/2012)  . Chronic kidney disease    "from my sickle cell" (09/25/2012)  . CKD (chronic kidney disease), stage II   . Colitis 04/19/2017   CT scan abd/ pelvis  . GERD (gastroesophageal reflux disease)    "after I eat alot of spicey foods" (09/25/2012)  . Gynecomastia, male 07/10/2012  . History of blood transfusion    "always related to sickle cell crisis" (09/25/2012)  . 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   Noted in noted from Hematology [facility] at [facility]  . Migraines    "take RX qd to prevent them" (  09/25/2012)  . Opioid dependence with withdrawal ([place]) 08/30/2012  . Renal insufficiency   . Sickle cell anemia (HCC)   . Sickle cell crisis ([place]) 09/25/2012  . Sickle cell nephropathy ([place]) 07/10/2012  . Sinus tachycardia   . Tachycardia with heart rate 121-140 beats per minute with ambulation 08/04/2016   Past Surgical History:  Procedure Laterality Date  . ABCESS DRAINAGE    . CHOLECYSTECTOMY  ~ 2012  . COLONOSCOPY N/A 04/23/2017   Procedure: COLONOSCOPY;  Surgeon: Irene Shipper, MD;  Location: [facility];  Service: Endoscopy;  Laterality:  N/A;  . IR FLUORO GUIDE CV LINE RIGHT  12/17/2016  . IR RADIOLOGIST EVAL & MGMT  10/02/2018  . IR RADIOLOGIST EVAL & MGMT  10/15/2018  . IR REMOVAL TUN CV CATH W/O FL  12/21/2016  . IR US GUIDE VASC ACCESS RIGHT  12/17/2016  . spleenectomy     Family History  Problem Relation Age of Onset  . Breast cancer Mother    Social History   Socioeconomic History  . Marital status: Single    Spouse name: Not on file  . Number of children: Not on file  . Years of education: Not on file  . Highest education level: Not on file  Occupational History  . Not on file  Tobacco Use  . Smoking status: Current Some Day Smoker    Packs/day: 0.10    Types: Cigarettes  . Smokeless tobacco: Never Used  . Tobacco comment: 09/25/2012 "I don't buy cigarettes; bum one from friends q now and then"  Vaping Use  . Vaping Use: Never used  Substance and Sexual Activity  . Alcohol use: Yes    Alcohol/week: 0.0 standard drinks    Comment: Once a month  . Drug use: Yes    Types: Marijuana    Comment: Every 2-3 weeks   . Sexual activity: Yes  Other Topics Concern  . Not on file  Social History Narrative    Lives alone in [place].   Back at school, studying Public relations account executive. Unemployed.    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    Social Determinants of Health   Financial Resource Strain:   . Difficulty of Paying Living Expenses: Not on file  Food Insecurity:   . Worried About Charity fundraiser in the Last Year: Not on file  . Ran Out of Food in the Last Year: Not on file  Transportation Needs:   . Lack of Transportation (Medical): Not on file  . Lack of Transportation (Non-Medical): Not on file  Physical Activity:   . Days of Exercise per Week: Not on file  . Minutes of Exercise per Session: Not on file  Stress:   . Feeling of Stress : Not on file   Social Connections:   . Frequency of Communication with Friends and Family: Not on file  . Frequency of Social Gatherings with Friends and Family: Not on file  . Attends Religious Services: Not on file  . Active Member of Clubs or Organizations: Not on file  . Attends Archivist Meetings: Not on file  . Marital Status: Not on file  Intimate Partner Violence:   . Fear of Current or Ex-Partner: Not on file  . Emotionally Abused: Not on file  . Physically Abused: Not on file  . Sexually Abused: Not  on file   Review of Systems  Constitutional: Positive for malaise/fatigue. Negative for chills and fever.  HENT: Negative.   Eyes: Negative.   Respiratory: Positive for shortness of breath.   Cardiovascular: Negative.  Negative for chest pain.  Gastrointestinal: Negative.   Genitourinary: Negative.   Musculoskeletal: Positive for back pain and joint pain.  Skin: Negative.   Neurological: Negative.  Negative for dizziness and headaches.  Endo/Heme/Allergies: Negative.   Psychiatric/Behavioral: Negative.     Physical Exam: Blood pressure 120/67, pulse 96, temperature 98.2 F (36.8 C), temperature source Temporal, resp. rate 16, SpO2 99 %. Physical Exam Constitutional:      Appearance: Normal appearance.  HENT:     Mouth/Throat:     Mouth: Mucous membranes are moist.     Pharynx: Oropharynx is clear.  Eyes:     Pupils: Pupils are equal, round, and reactive to light.  Cardiovascular:     Rate and Rhythm: Normal rate and regular rhythm.     Pulses: Normal pulses.     Heart sounds: Murmur heard.   Pulmonary:     Effort: Pulmonary effort is normal.     Breath sounds: Normal breath sounds.  Abdominal:     General: Abdomen is flat. Bowel sounds are normal.  Musculoskeletal:        General: Normal range of motion.  Skin:    General: Skin is warm.  Neurological:     General: No focal deficit present.     Mental Status: He is alert. Mental status is at baseline.   Psychiatric:        Mood and Affect: Mood normal.        Behavior: Behavior normal.        Thought Content: Thought content normal.        Judgment: Judgment normal.    Lab results: Results for orders placed or performed during the hospital encounter of 12/01/19 (from the past 24 hour(s))  CBC     Status: Abnormal   Collection Time: 12/01/19  9:58 AM  Result Value Ref Range   WBC 20.7 (H) 4.0 - 10.5 K/uL   RBC 1.57 (L) 4.22 - 5.81 MIL/uL   Hemoglobin 5.5 (LL) 13.0 - 17.0 g/dL   HCT 16.1 (L) 39 - 52 %   MCV 102.5 (H) 80.0 - 100.0 fL   MCH 35.0 (H) 26.0 - 34.0 pg   MCHC 34.2 30.0 - 36.0 g/dL   RDW 28.5 (H) 11.5 - 15.5 %   Platelets 223 150 - 400 K/uL   nRBC 0.1 0.0 - 0.2 %    Imaging results:  No results found.   Assessment & Plan:  Sickle cell disease with pain crisis: Admit to sickle cell day infusion clinic for pain management and extended observation Pain managed with IV Dilaudid via PCA with settings of 0.5 mg, 10-minute lockout, and 3 mg/h. IV fluids at [place] Toradol due to history of stage IV chronic kidney disease Review CMP and reticulocytes as results become available. If pain intensity remains elevated, and/or hemodynamic stability changes, consider transitioning to inpatient services for higher level of care.  Symptomatic anemia: Patient endorses fatigue and shortness of breath.  Will transfuse 2 units PRBCs.  Baseline hemoglobin is 6-7 g/dL.  Patient received darbepoetin injection without complication.  Leukocytosis: WBCs elevated at 20.7.  Appear to be chronically elevated.  Patient is afebrile without any signs of infection or inflammation.  Continue to follow closely without antibiotics at this time.  Chronic kidney  disease stage IV: Baseline creatinine 2.4-2.5, review CMP as results become available.  Follow-up by nephrology as an outpatient.  Avoid all nephrotoxins.    [name]  APRN, MSN, FNP-C Patient [facility] [address] [place], [place] [address] [phone]  12/01/2019, 12:13 PM

## 2019-12-02 DIAGNOSIS — D5700 Hb-SS disease with crisis, unspecified: Secondary | ICD-10-CM | POA: Diagnosis not present

## 2019-12-02 DIAGNOSIS — F17210 Nicotine dependence, cigarettes, uncomplicated: Secondary | ICD-10-CM | POA: Diagnosis not present

## 2019-12-02 DIAGNOSIS — D649 Anemia, unspecified: Secondary | ICD-10-CM

## 2019-12-02 DIAGNOSIS — N184 Chronic kidney disease, stage 4 (severe): Secondary | ICD-10-CM | POA: Diagnosis not present

## 2019-12-02 DIAGNOSIS — Z79899 Other long term (current) drug therapy: Secondary | ICD-10-CM | POA: Diagnosis not present

## 2019-12-02 DIAGNOSIS — Z20822 Contact with and (suspected) exposure to covid-19: Secondary | ICD-10-CM | POA: Diagnosis not present

## 2019-12-02 LAB — BPAM RBC
Blood Product Expiration Date: 202111012359
Blood Product Expiration Date: 202111022359
ISSUE DATE / TIME: 202110182006
ISSUE DATE / TIME: 202110182259
Unit Type and Rh: 9500
Unit Type and Rh: 9500

## 2019-12-02 LAB — TYPE AND SCREEN
ABO/RH(D): O POS
Antibody Screen: NEGATIVE
Donor AG Type: NEGATIVE
Donor AG Type: NEGATIVE
Unit division: 0
Unit division: 0

## 2019-12-02 LAB — BASIC METABOLIC PANEL
Anion gap: 11 (ref 5–15)
BUN: 39 mg/dL — ABNORMAL HIGH (ref 6–20)
CO2: 18 mmol/L — ABNORMAL LOW (ref 22–32)
Calcium: 9 mg/dL (ref 8.9–10.3)
Chloride: 107 mmol/L (ref 98–111)
Creatinine, Ser: 2.38 mg/dL — ABNORMAL HIGH (ref 0.61–1.24)
GFR, Estimated: 34 mL/min — ABNORMAL LOW (ref >60)
Glucose, Bld: 99 mg/dL (ref 70–99)
Potassium: 5.2 mmol/L — ABNORMAL HIGH (ref 3.5–5.1)
Sodium: 136 mmol/L (ref 135–145)

## 2019-12-02 LAB — CBC
HCT: 23.9 % — ABNORMAL LOW (ref 39.0–52.0)
Hemoglobin: 8.5 g/dL — ABNORMAL LOW (ref 13.0–17.0)
MCH: 32.6 pg (ref 26.0–34.0)
MCHC: 35.6 g/dL (ref 30.0–36.0)
MCV: 91.6 fL (ref 80.0–100.0)
Platelets: 229 10*3/uL (ref 150–400)
RBC: 2.61 MIL/uL — ABNORMAL LOW (ref 4.22–5.81)
RDW: 24.1 % — ABNORMAL HIGH (ref 11.5–15.5)
WBC: 14.7 10*3/uL — ABNORMAL HIGH (ref 4.0–10.5)
nRBC: 0.2 % (ref 0.0–0.2)

## 2019-12-02 LAB — RETICULOCYTES
Immature Retic Fract: 23.8 % — ABNORMAL HIGH (ref 2.3–15.9)
RBC.: 2.6 MIL/uL — ABNORMAL LOW (ref 4.22–5.81)
Retic Count, Absolute: 239.5 10*3/uL — ABNORMAL HIGH (ref 19.0–186.0)
Retic Ct Pct: 9.2 % — ABNORMAL HIGH (ref 0.4–3.1)

## 2019-12-02 MED ORDER — HEPARIN SOD (PORK) LOCK FLUSH 100 UNIT/ML IV SOLN: 500.0000 [IU] | Freq: Once | INTRAVENOUS | Status: DC

## 2019-12-02 NOTE — Discharge Summary (Signed)
Physician Discharge Summary  [name] [name]. HQI:[id number] DOB: 30-Sep-1980 DOA: 12/01/2019  PCP: Quentin Angst, MD  Admit date: 12/01/2019  Discharge date: 12/02/2019  Discharge Diagnoses:  Active Problems:   Sickle cell pain crisis [facility])   Discharge Condition: Stable  Disposition:  Pt is discharged home in good condition and is to follow up with [place] Kenia Teagarden, FNP this week to have labs evaluated. [name] A Gulick Jr. is instructed to increase activity slowly and balance with rest for the next few days, and use prescribed medication to complete treatment of pain  Diet: Regular Wt Readings from Last 3 Encounters:  12/01/19 51.3 kg  10/14/19 53.5 kg  10/01/19 50.8 kg    History of present illness:  [name] [name] is a 39 year old male with a medical history significant for sickle cell anemia type SS, chronic kidney disease stage IV, history of symptomatic anemia, opiate dependence and tolerance, and history of polysubstance abuse presents complaining of bilateral lower extremity pain that is consistent with previous sickle cell pain crisis.  Patient presented this a.m. for routine darbepoetin injection.  Patient complained of increased pain, shortness of breath, and fatigue over the past 2 days.  Pain has not improved on home medication.  He last had oxycodone earlier this a.m. without sustained relief.  Pain intensity is 8/10 characterized as constant and throbbing. Patient's hemoglobin is 5.5, below his baseline of 6-7 g/dL.  He denies headache, chest pain, dizziness, urinary symptoms, nausea, vomiting, or diarrhea.  No sick contacts, recent travel, or exposure to COVID-19.  Of note, patient fully vaccinated.    Sickle cell day infusion course:  Vital signs show: Temperature 98.8, BP 138/67, Pulse 107, HR 89, and oxygen saturation 95% on RA. Hemoglobin 5.5, which is below patient's baseline of 6-7 g/dL, WBCs 13.2, potassium 5.2, and creatinine 2.42.  Pain persists  despite IV fluids and IV Dilaudid.  Patient admitted for management of symptomatic anemia in the setting of sickle cell pain crisis.   Hospital Course:  Sickle cell disease with pain crisis: Patient was admitted for sickle cell pain crisis and managed appropriately with IVF, IV Dilaudid via PCA, as well as other adjunct therapies per sickle cell pain management protocols.  Pain intensity decreased to 4/10.  Patient is requesting discharge home.  Symptomatic anemia: On admission, patient endorsed fatigue, and some shortness of breath with exertion.  Hemoglobin was found to be 5.5 g/dL, which was below patient's baseline.  He is status post 2 units PRBCs.  Hemoglobin has improved to 8.5.  Patient is also s/p darbepoetin 100 mcg.  He is scheduled to follow-up with hematology.  Also, patient will follow-up on 12/09/2019 to repeat labs.  He is scheduled to receive darbepoetin injections every 2 weeks.  Chronic kidney disease stage IV: Patient has a long history of chronic kidney disease stage IV. Creatinine at discharge is 2.3, slightly above patient's baseline.  Patient is followed by nephrology monthly as an outpatient.  Advised to continue following as scheduled.  Patient was therefore discharged home today in a hemodynamically stable condition.   [name] [name] will follow-up with PCP within 1 week of this discharge. [name] [name] was counseled extensively about nonpharmacologic means of pain management, patient verbalized understanding and was appreciative of  the care received during this admission.   We discussed the need for good hydration, monitoring of hydration status, avoidance of heat, cold, stress, and infection triggers. We discussed the need to be adherent with taking Hydrea and other home medications. Patient was  reminded of the need to seek medical attention immediately if any symptom of bleeding, anemia, or infection occurs.  Discharge Exam: Vitals:   12/02/19 0430 12/02/19 0800  BP: 117/69    Pulse: 92   Resp: 14 16  Temp: 98.5 F (36.9 C)   SpO2: 98% 97%   Vitals:   12/02/19 0137 12/02/19 0325 12/02/19 0430 12/02/19 0800  BP: 122/76  117/69   Pulse: 94  92   Resp: 14 15 14 16   Temp: 98.9 F (37.2 C)  98.5 F (36.9 C)   TempSrc: Oral  Oral   SpO2: 97% 97% 98% 97%  Weight:      Height:        General appearance : Awake, alert, not in any distress. Speech Clear. Not toxic looking HEENT: Atraumatic and Normocephalic, pupils equally reactive to light and accomodation Neck: Supple, no JVD. No cervical lymphadenopathy.  Chest: Good air entry bilaterally, no added sounds  CVS: S1 S2 regular, no murmurs.  Abdomen: Bowel sounds present, Non tender and not distended with no gaurding, rigidity or rebound. Extremities: B/L Lower Ext shows no edema, both legs are warm to touch Neurology: Awake alert, and oriented X 3, CN II-XII intact, Non focal Skin: No Rash  Discharge Instructions  Discharge Instructions    Discharge patient   Complete by: As directed    Discharge disposition: 01-Home or Self Care   Discharge patient date: 12/02/2019     Allergies as of 12/02/2019      Reactions   Nsaids Other (See Comments)   Kidney disease   Morphine And Related Other (See Comments)   "Real bad headaches"       Medication List    TAKE these medications   acetaminophen 500 MG tablet Commonly known as: TYLENOL Take 1 tablet (500 mg total) by mouth every 6 (six) hours as needed. What changed: reasons to take this   folic acid 1 MG tablet Commonly known as: FOLVITE Take 1 tablet (1 mg total) by mouth daily.   Lokelma 10 g Pack packet Generic drug: sodium zirconium cyclosilicate Take 1 packet by mouth daily.   nortriptyline 25 MG capsule Commonly known as: PAMELOR TAKE 1 CAPSULE (25 MG TOTAL) BY MOUTH 2 (TWO) TIMES DAILY.   Oxycodone HCl 10 MG Tabs Take 1 tablet (10 mg total) by mouth every 6 (six) hours as needed. What changed: reasons to take this   oxyCODONE  15 mg 12 hr tablet Commonly known as: OxyCONTIN Take 1 tablet (15 mg total) by mouth every 12 (twelve) hours. What changed: Another medication with the same name was changed. Make sure you understand how and when to take each.   sodium bicarbonate 650 MG tablet Take 650 mg by mouth 2 (two) times daily.   Vitamin D3 50 MCG (2000 UT) Tabs Take 1 each by mouth daily.       The results of significant diagnostics from this hospitalization (including imaging, microbiology, ancillary and laboratory) are listed below for reference.    Significant Diagnostic Studies: No results found.  Microbiology: Recent Results (from the past 240 hour(s))  Resp Panel by RT PCR (RSV, Flu A&B, Covid) - Nasopharyngeal Swab     Status: None   Collection Time: 12/01/19  4:22 PM   Specimen: Nasopharyngeal Swab  Result Value Ref Range Status   SARS Coronavirus 2 by RT PCR NEGATIVE NEGATIVE Final    Comment: (NOTE) SARS-CoV-2 target nucleic acids are NOT DETECTED.  The SARS-CoV-2 RNA is generally  detectable in upper respiratoy specimens during the acute phase of infection. The lowest concentration of SARS-CoV-2 viral copies this assay can detect is 131 copies/mL. A negative result does not preclude SARS-Cov-2 infection and should not be used as the sole basis for treatment or other patient management decisions. A negative result may occur with  improper specimen collection/handling, submission of specimen other than nasopharyngeal swab, presence of viral mutation(s) within the areas targeted by this assay, and inadequate number of viral copies (<131 copies/mL). A negative result must be combined with clinical observations, patient history, and epidemiological information. The expected result is Negative.  Fact Sheet for Patients:  https://www.moore.com/  Fact Sheet for Healthcare Providers:  https://www.young.biz/  This test is no t yet approved or cleared by  the [place] FDA and  has been authorized for detection and/or diagnosis of SARS-CoV-2 by FDA under an Emergency Use Authorization (EUA). This EUA will remain  in effect (meaning this test can be used) for the duration of the COVID-19 declaration under Section 564(b)(1) of the Act, 21 U.S.C. section 360bbb-3(b)(1), unless the authorization is terminated or revoked sooner.     Influenza A by PCR NEGATIVE NEGATIVE Final   Influenza B by PCR NEGATIVE NEGATIVE Final    Comment: (NOTE) The Xpert Xpress SARS-CoV-2/FLU/RSV assay is intended as an aid in  the diagnosis of influenza from Nasopharyngeal swab specimens and  should not be used as a sole basis for treatment. Nasal washings and  aspirates are unacceptable for Xpert Xpress SARS-CoV-2/FLU/RSV  testing.  Fact Sheet for Patients: https://www.moore.com/  Fact Sheet for Healthcare Providers: https://www.young.biz/  This test is not yet approved or cleared by the [place] FDA and  has been authorized for detection and/or diagnosis of SARS-CoV-2 by  FDA under an Emergency Use Authorization (EUA). This EUA will remain  in effect (meaning this test can be used) for the duration of the  Covid-19 declaration under Section 564(b)(1) of the Act, 21  U.S.C. section 360bbb-3(b)(1), unless the authorization is  terminated or revoked.    Respiratory Syncytial Virus by PCR NEGATIVE NEGATIVE Final    Comment: (NOTE) Fact Sheet for Patients: https://www.moore.com/  Fact Sheet for Healthcare Providers: https://www.young.biz/  This test is not yet approved or cleared by the [place] FDA and  has been authorized for detection and/or diagnosis of SARS-CoV-2 by  FDA under an Emergency Use Authorization (EUA). This EUA will remain  in effect (meaning this test can be used) for the duration of the  COVID-19 declaration under Section 564(b)(1) of the Act, 21  U.S.C.  section 360bbb-3(b)(1), unless the authorization is terminated or  revoked. Performed at [facility], [address], [place], [place] [address]      Labs: Basic Metabolic Panel: Recent Labs  Lab 12/02/19 0622  NA 136  K 5.2*  CL 107  CO2 18*  GLUCOSE 99  BUN 39*  CREATININE 2.38*  CALCIUM 9.0   Liver Function Tests: No results for input(s): AST, ALT, ALKPHOS, BILITOT, PROT, ALBUMIN in the last 168 hours. No results for input(s): LIPASE, AMYLASE in the last 168 hours. No results for input(s): AMMONIA in the last 168 hours. CBC: Recent Labs  Lab 12/01/19 0958 12/02/19 0622  WBC 20.7* 14.7*  HGB 5.5* 8.5*  HCT 16.1* 23.9*  MCV 102.5* 91.6  PLT 223 229   Cardiac Enzymes: No results for input(s): CKTOTAL, CKMB, CKMBINDEX, TROPONINI in the last 168 hours. BNP: Invalid input(s): POCBNP CBG: No results for input(s):  GLUCAP in the last 168 hours.  Time coordinating discharge: 35 minutes  Signed: [name]  APRN, MSN, FNP-C [facility] [address] [name], [place] [address] [phone]   [facility] 12/02/2019, 9:31 AM

## 2019-12-03 LAB — URINE CULTURE: Culture: <10000 — AB

## 2019-12-08 IMAGING — DX PORTABLE CHEST - 1 VIEW
1 series · 1 of 1 positions shown · non-contrast
Comparison: Chest x-ray dated 05/05/2018

CLINICAL DATA: Chest pain

EXAM:
PORTABLE CHEST 1 VIEW

[chest ap]
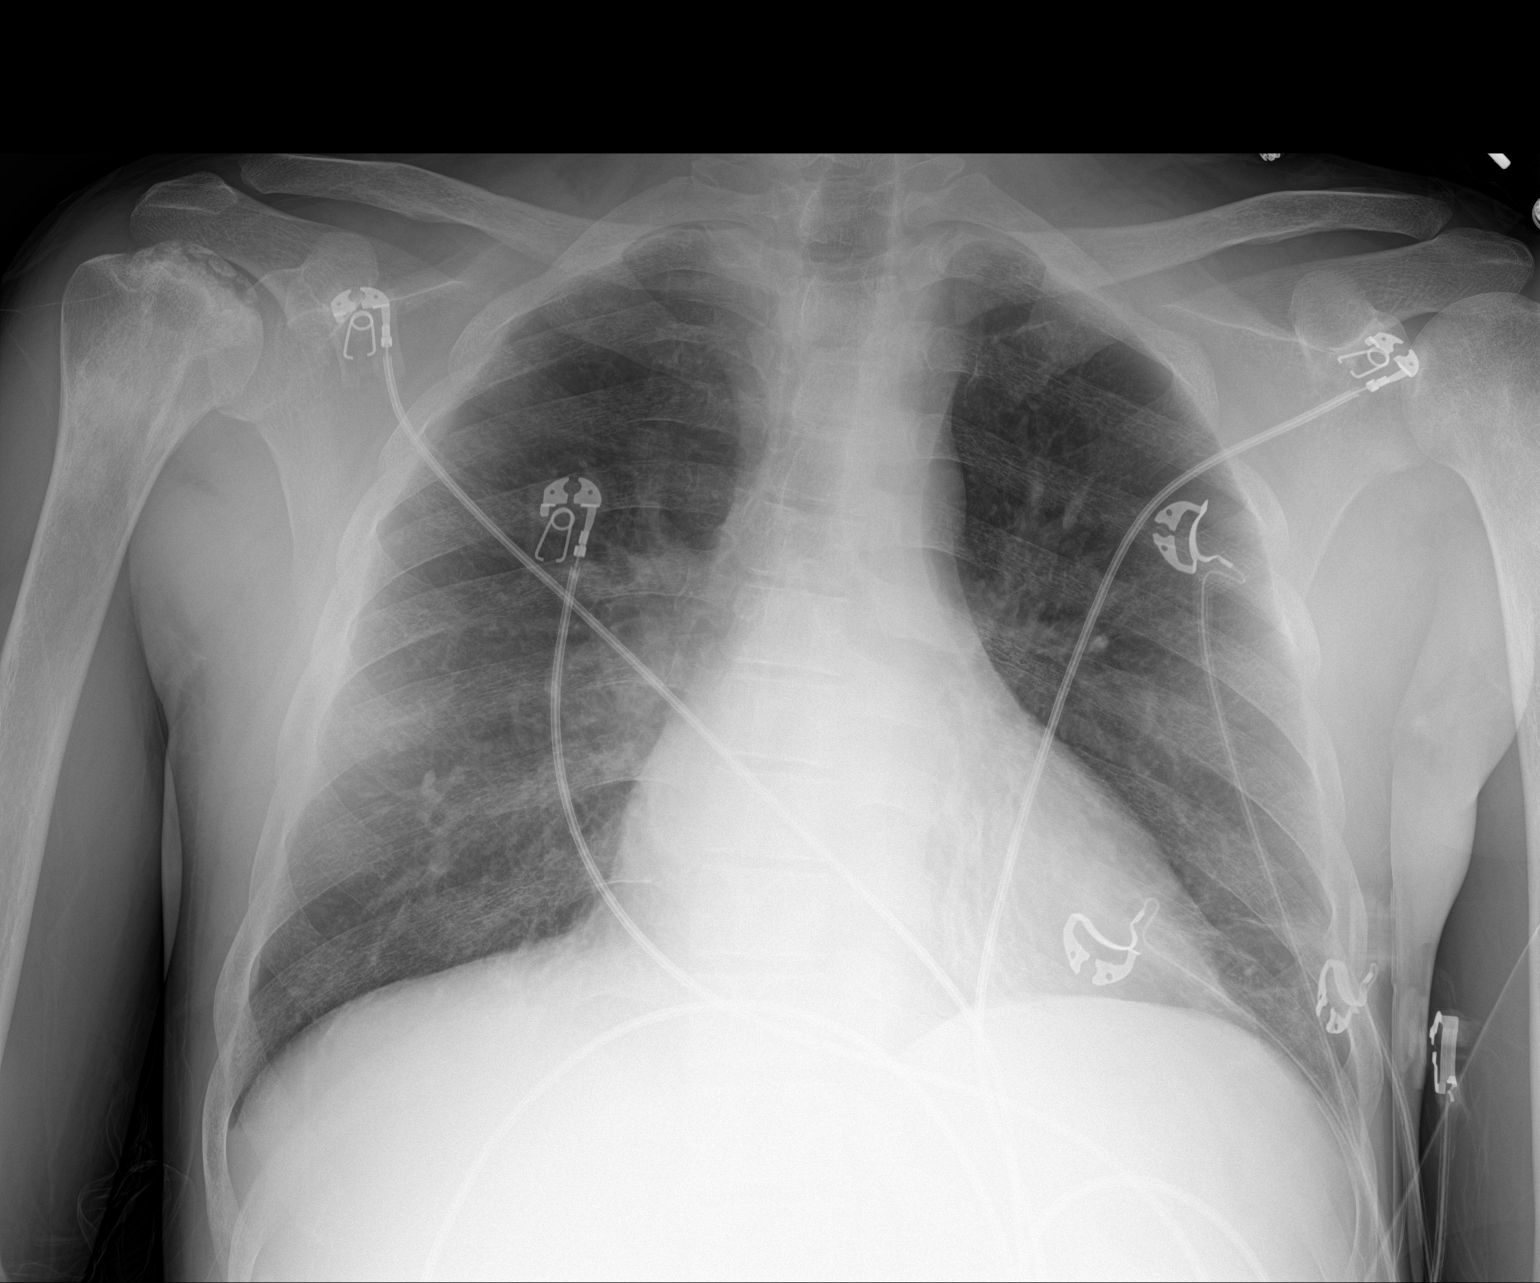

[1 of 1 positions shown; findings below may reference images not displayed]

FINDINGS: Heart size is stable. There are hazy airspace opacities bilaterally.
No pneumothorax. No large pleural effusion. Avascular necrosis is
noted of the right humeral head. There are sclerotic areas in the
proximal right humerus.
IMPRESSION: 1. Hazy airspace opacities bilaterally which are nonspecific but can
be seen in patients with a sickle cell crisis.
2. Avascular necrosis of the right humeral head.

## 2019-12-09 ENCOUNTER — Ambulatory Visit (HOSPITAL_COMMUNITY)
Admission: RE | Admit: 2019-12-09 | Discharge: 2019-12-09 | Disposition: A | Discharge status: Home / Self Care | Payer: Medicare Other | Source: Other Acute Inpatient Hospital | Attending: Internal Medicine | Admitting: Internal Medicine

## 2019-12-09 ENCOUNTER — Other Ambulatory Visit: Payer: Self-pay

## 2019-12-09 DIAGNOSIS — D571 Sickle-cell disease without crisis: Secondary | ICD-10-CM | POA: Diagnosis not present

## 2019-12-09 LAB — CBC
HCT: 29.3 % — ABNORMAL LOW (ref 39.0–52.0)
Hemoglobin: 9.4 g/dL — ABNORMAL LOW (ref 13.0–17.0)
MCH: 32.5 pg (ref 26.0–34.0)
MCHC: 32.1 g/dL (ref 30.0–36.0)
MCV: 101.4 fL — ABNORMAL HIGH (ref 80.0–100.0)
Platelets: 288 10*3/uL (ref 150–400)
RBC: 2.89 MIL/uL — ABNORMAL LOW (ref 4.22–5.81)
RDW: 23.9 % — ABNORMAL HIGH (ref 11.5–15.5)
WBC: 11.6 10*3/uL — ABNORMAL HIGH (ref 4.0–10.5)
nRBC: 0.3 % — ABNORMAL HIGH (ref 0.0–0.2)

## 2019-12-09 LAB — COMPREHENSIVE METABOLIC PANEL
ALT: 33 U/L (ref 0–44)
AST: 46 U/L — ABNORMAL HIGH (ref 15–41)
Albumin: 3.4 g/dL — ABNORMAL LOW (ref 3.5–5.0)
Alkaline Phosphatase: 240 U/L — ABNORMAL HIGH (ref 38–126)
Anion gap: 9 (ref 5–15)
BUN: 30 mg/dL — ABNORMAL HIGH (ref 6–20)
CO2: 22 mmol/L (ref 22–32)
Calcium: 8.6 mg/dL — ABNORMAL LOW (ref 8.9–10.3)
Chloride: 104 mmol/L (ref 98–111)
Creatinine, Ser: 2.33 mg/dL — ABNORMAL HIGH (ref 0.61–1.24)
GFR, Estimated: 36 mL/min — ABNORMAL LOW (ref >60)
Glucose, Bld: 103 mg/dL — ABNORMAL HIGH (ref 70–99)
Potassium: 4 mmol/L (ref 3.5–5.1)
Sodium: 135 mmol/L (ref 135–145)
Total Bilirubin: 1.8 mg/dL — ABNORMAL HIGH (ref 0.3–1.2)
Total Protein: 7 g/dL (ref 6.5–8.1)

## 2019-12-09 NOTE — Progress Notes (Signed)
Patient arrived for lab draw. CBC and BMP drawn by phlebotomist. Patient tolerated well. Provider will follow-up with patient concerning lab results. Patient alert, oriented and ambulatory at discharge.

## 2019-12-17 DIAGNOSIS — D582 Other hemoglobinopathies: Secondary | ICD-10-CM | POA: Diagnosis not present

## 2019-12-17 DIAGNOSIS — M25552 Pain in left hip: Secondary | ICD-10-CM | POA: Diagnosis not present

## 2019-12-17 DIAGNOSIS — M90569 Osteonecrosis in diseases classified elsewhere, unspecified lower leg: Secondary | ICD-10-CM | POA: Diagnosis not present

## 2019-12-17 DIAGNOSIS — D5700 Hb-SS disease with crisis, unspecified: Secondary | ICD-10-CM | POA: Diagnosis not present

## 2019-12-17 DIAGNOSIS — M9059 Osteonecrosis in diseases classified elsewhere, multiple sites: Secondary | ICD-10-CM | POA: Diagnosis not present

## 2019-12-17 DIAGNOSIS — M25551 Pain in right hip: Secondary | ICD-10-CM | POA: Diagnosis not present

## 2019-12-18 DIAGNOSIS — D582 Other hemoglobinopathies: Secondary | ICD-10-CM | POA: Insufficient documentation

## 2019-12-25 ENCOUNTER — Other Ambulatory Visit: Payer: Self-pay

## 2019-12-25 ENCOUNTER — Other Ambulatory Visit: Payer: Self-pay | Admitting: Family Medicine

## 2019-12-25 ENCOUNTER — Telehealth: Payer: Self-pay | Admitting: Family Medicine

## 2019-12-25 DIAGNOSIS — G894 Chronic pain syndrome: Secondary | ICD-10-CM

## 2019-12-25 DIAGNOSIS — D571 Sickle-cell disease without crisis: Secondary | ICD-10-CM

## 2019-12-25 MED ORDER — OXYCODONE HCL 10 MG PO TABS: 10.0000 mg | ORAL_TABLET | Freq: Four times a day (QID) | ORAL | 0 refills | Status: DC | PRN

## 2019-12-25 NOTE — Telephone Encounter (Signed)
Please see patient's request for refill

## 2019-12-25 NOTE — Progress Notes (Signed)
Reviewed PDMP substance reporting system prior to prescribing opiate medications. No inconsistencies noted.  Meds ordered this encounter  Medications   Oxycodone HCl 10 MG TABS    Sig: Take 1 tablet (10 mg total) by mouth every 6 (six) hours as needed.    Dispense:  60 tablet    Refill:  0    Order Specific Question:   Supervising Provider    Answer:   [name], [name] [[id number]]   [name] Sandner Moore Ileigh Mettler  APRN, MSN, FNP-C [facility] [address]  [place], [place] [address] [phone]  

## 2019-12-26 IMAGING — CR CHEST - 2 VIEW
2 series · 2 of 2 positions shown · non-contrast
Comparison: July 03, 2018

CLINICAL DATA: Chest pain.  Sickle cell disease.

EXAM:
CHEST - 2 VIEW

[w chest pa]
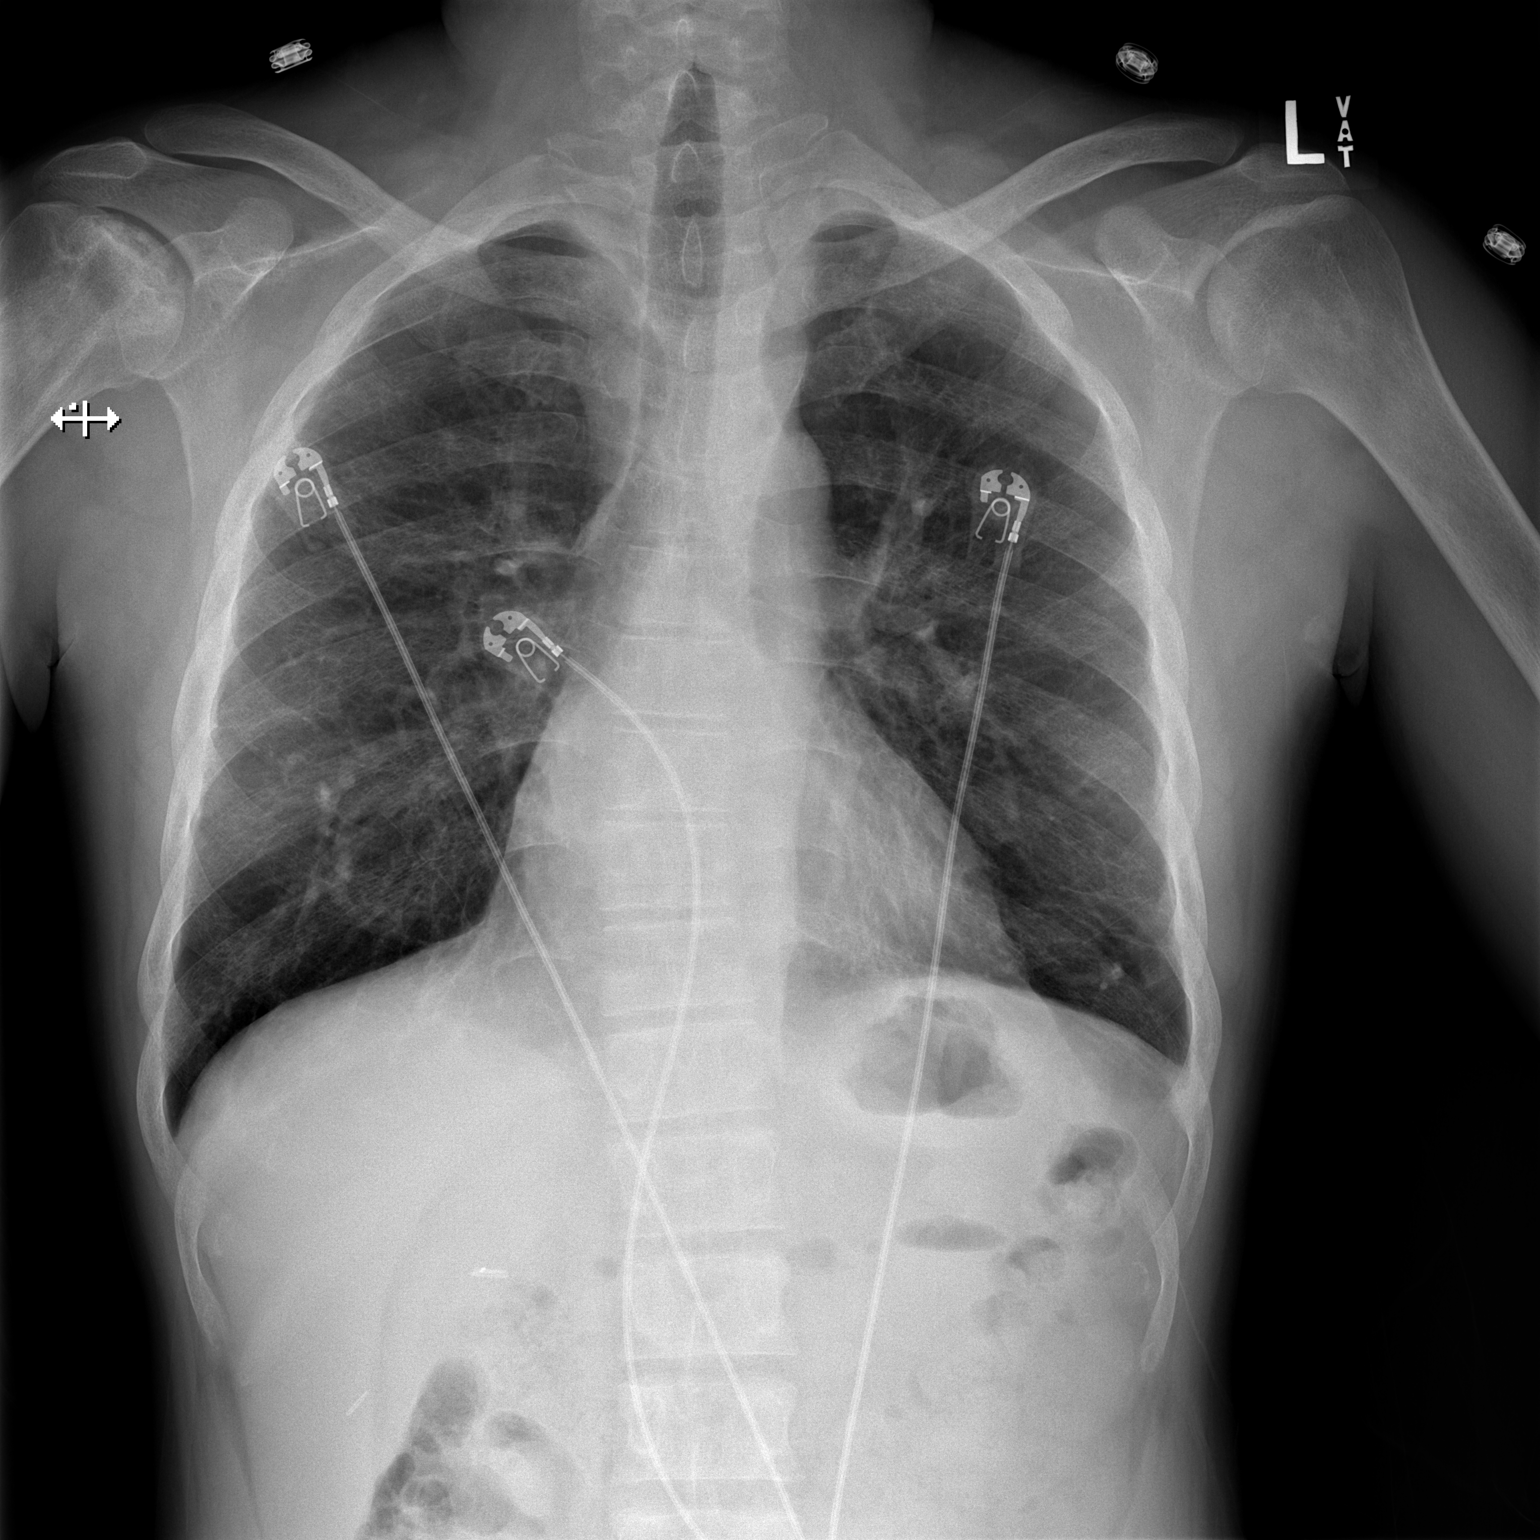

[w chest lat]
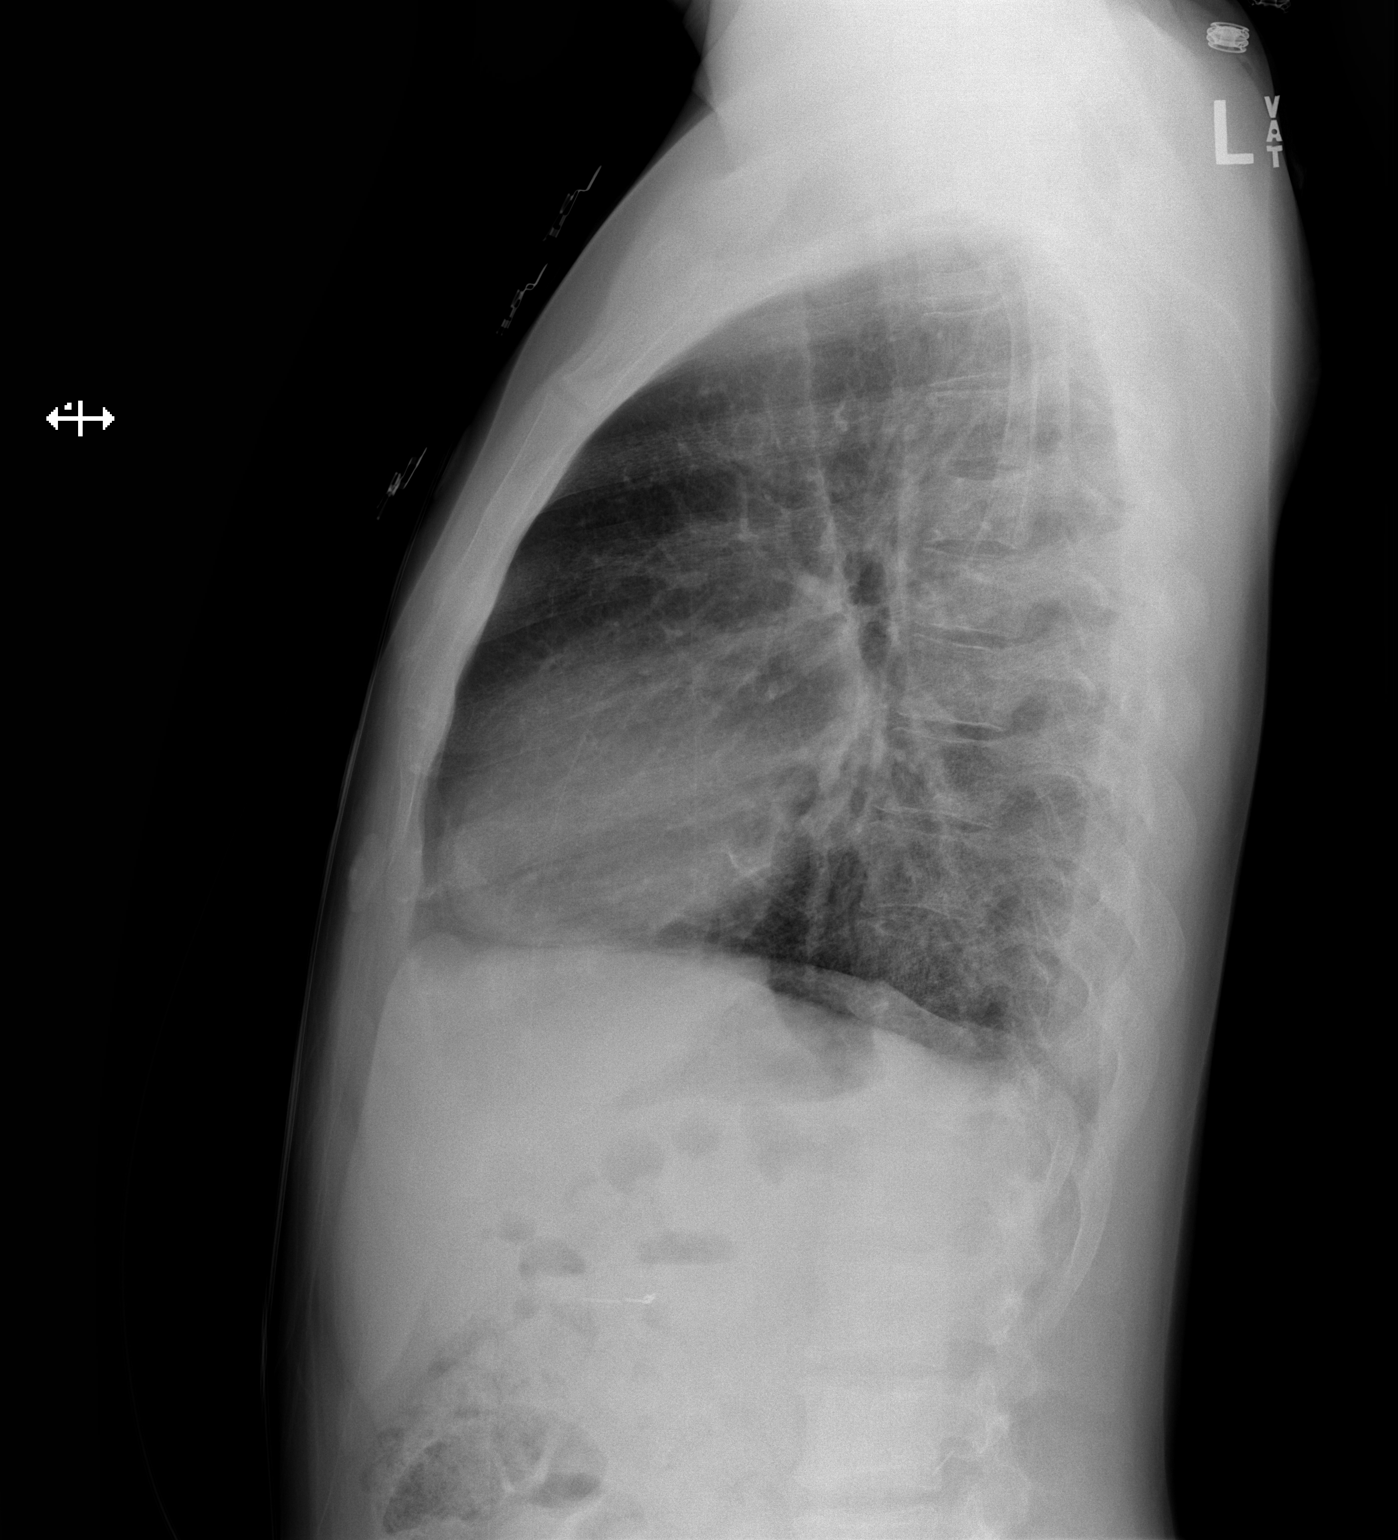

[2 of 2 positions shown; findings below may reference images not displayed]

FINDINGS: No edema or consolidation. Heart size and pulmonary vascularity are
normal. No adenopathy. No pneumothorax.

There is avascular necrosis in the right femoral head. There are
endplate infarcts in several vertebral bodies.
IMPRESSION: Lungs clear. No adenopathy. Bony changes indicative of known sickle
cell disease.

## 2019-12-27 IMAGING — DX CHEST - 2 VIEW
2 series · 2 of 2 positions shown · non-contrast
Comparison: July 21, 2018

CLINICAL DATA: Sickle cell crisis.  Pain

EXAM:
CHEST - 2 VIEW

[chest pa]
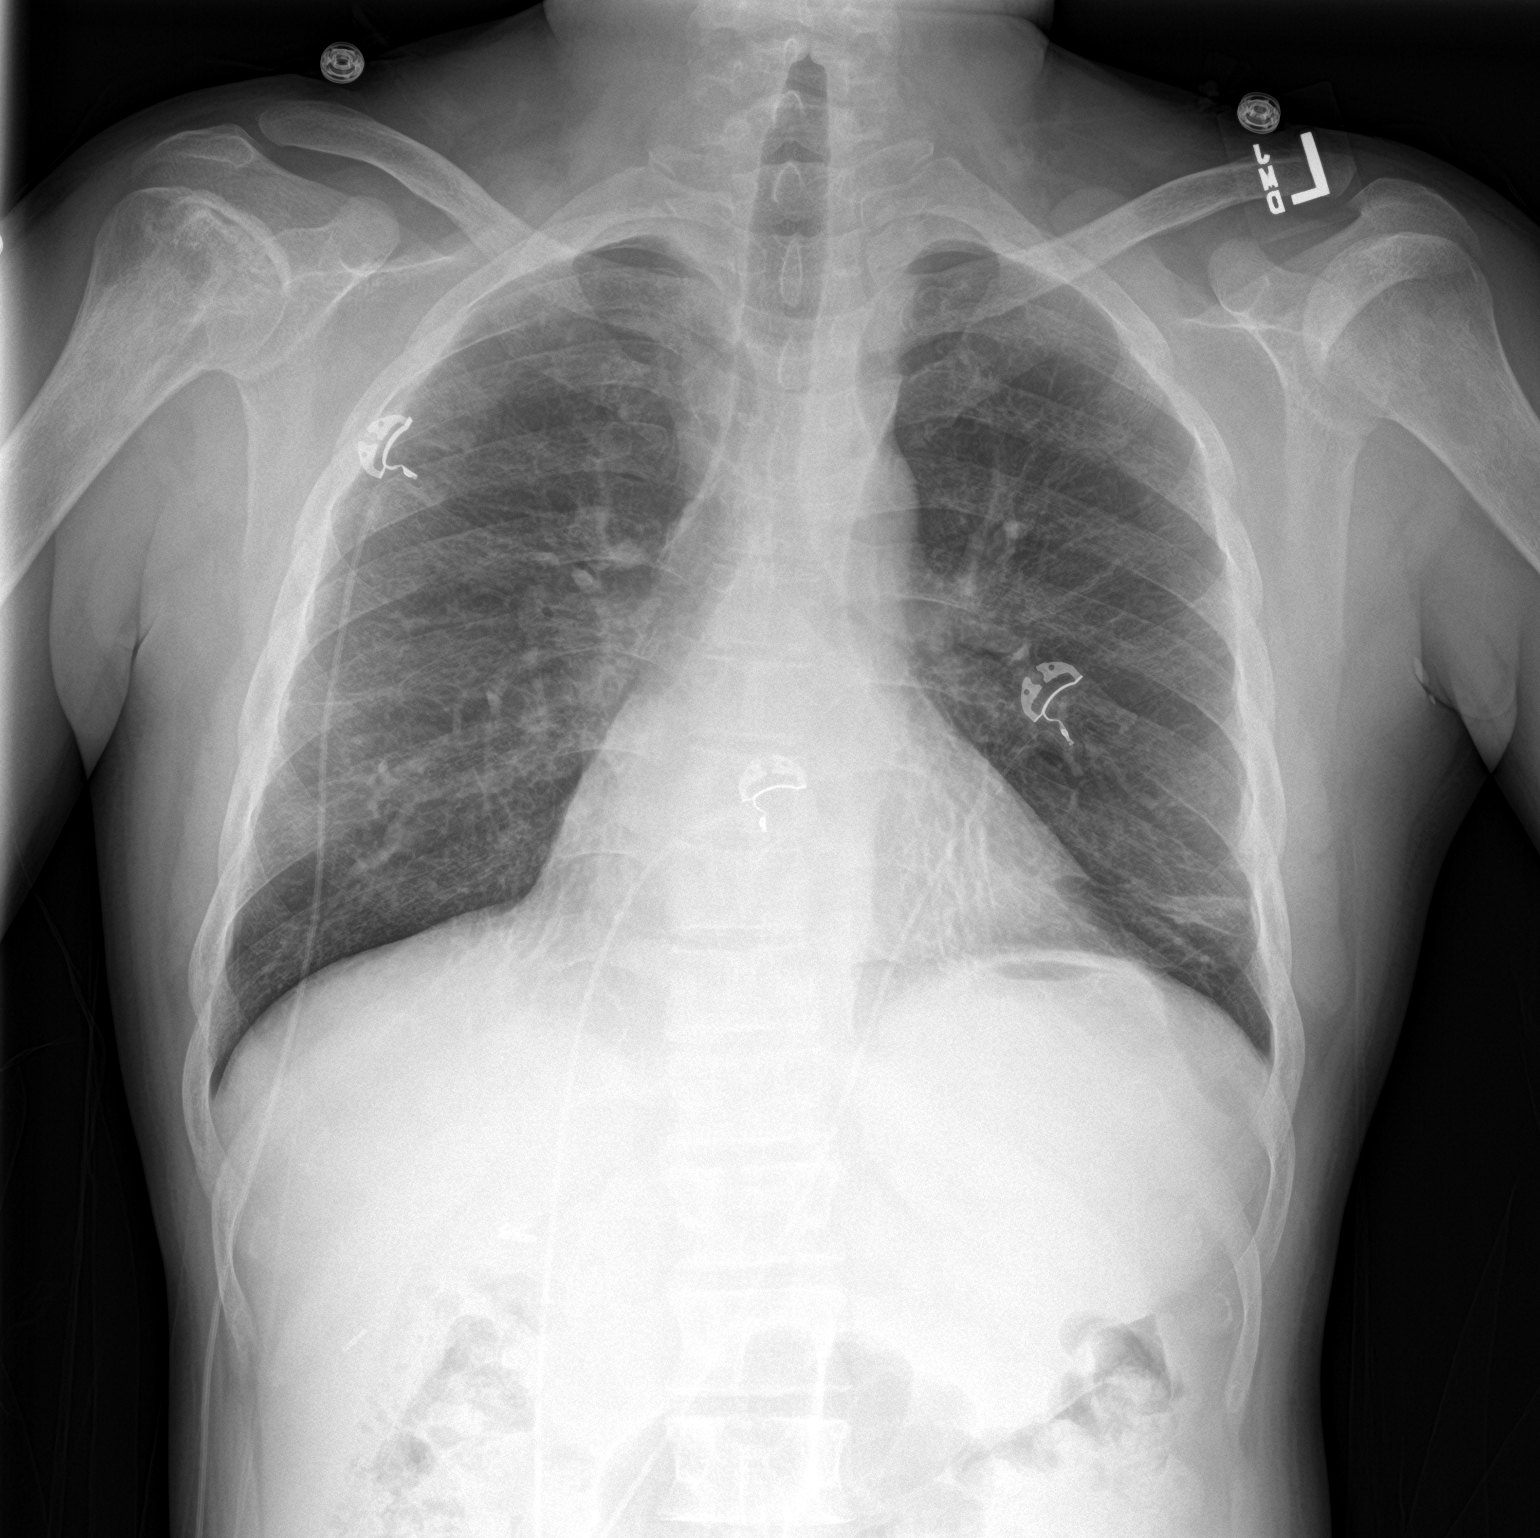

[chest lat]
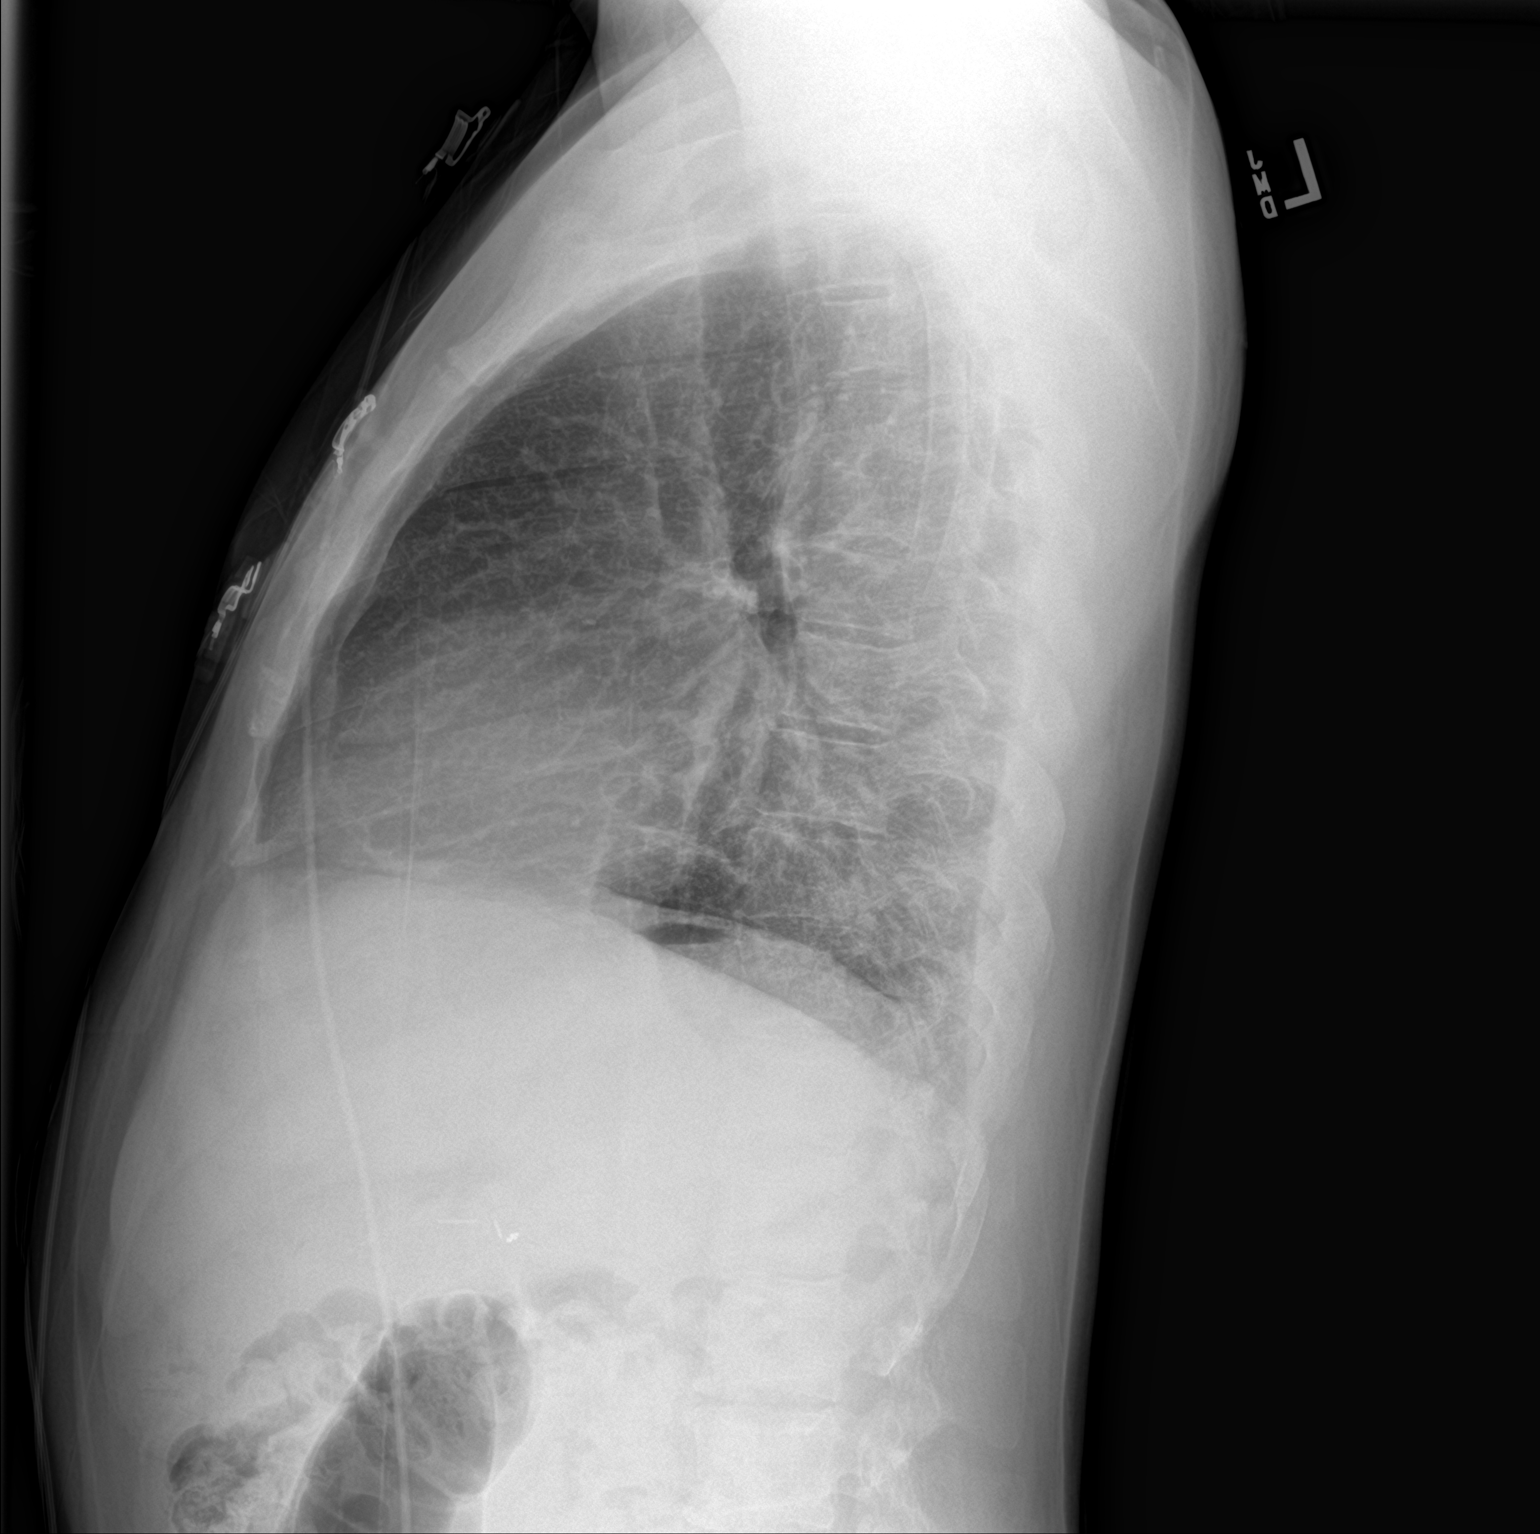

[2 of 2 positions shown; findings below may reference images not displayed]

FINDINGS: There is atelectatic change in the left base. The lungs elsewhere
are clear. Heart size and pulmonary vascularity are normal. No
adenopathy. There is a vascular necrosis in the right humeral head.
Several thoracic and lumbar spine endplate infarcts are again noted.
IMPRESSION: New left base atelectasis. Lungs elsewhere clear. Stable cardiac
silhouette. No adenopathy. Bony changes indicative of sickle cell
disease as noted.

## 2019-12-29 NOTE — Telephone Encounter (Signed)
Done

## 2019-12-30 ENCOUNTER — Other Ambulatory Visit: Payer: Self-pay | Admitting: Family Medicine

## 2019-12-30 ENCOUNTER — Telehealth: Payer: Self-pay | Admitting: Family Medicine

## 2019-12-30 DIAGNOSIS — D571 Sickle-cell disease without crisis: Secondary | ICD-10-CM

## 2019-12-30 DIAGNOSIS — G894 Chronic pain syndrome: Secondary | ICD-10-CM

## 2019-12-30 MED ORDER — OXYCODONE HCL ER 15 MG PO T12A: 15.0000 mg | EXTENDED_RELEASE_TABLET | Freq: Two times a day (BID) | ORAL | 0 refills | Status: DC

## 2019-12-30 NOTE — Progress Notes (Signed)
Reviewed PDMP substance reporting system prior to prescribing opiate medications. No inconsistencies noted.  Meds ordered this encounter  Medications   oxyCODONE (OXYCONTIN) 15 mg 12 hr tablet    Sig: Take 1 tablet (15 mg total) by mouth every 12 (twelve) hours.    Dispense:  60 tablet    Refill:  0    Order Specific Question:   Supervising Provider    Answer:   [name], [name] [[id number]]   Glenn Gullickson Moore Jorja Empie  APRN, MSN, FNP-C [facility] [address]  [place], [place] [address] [phone]  

## 2019-12-31 NOTE — Telephone Encounter (Signed)
Done

## 2020-01-06 ENCOUNTER — Encounter (HOSPITAL_COMMUNITY): Payer: Self-pay | Admitting: Internal Medicine

## 2020-01-06 ENCOUNTER — Observation Stay (HOSPITAL_COMMUNITY)
Admission: AD | Admit: 2020-01-06 | Discharge: 2020-01-07 | Disposition: A | Discharge status: Home / Self Care | Payer: Medicare Other | Source: Ambulatory Visit | Attending: Internal Medicine | Admitting: Internal Medicine

## 2020-01-06 ENCOUNTER — Telehealth (HOSPITAL_COMMUNITY): Payer: Self-pay | Admitting: General Practice

## 2020-01-06 DIAGNOSIS — G894 Chronic pain syndrome: Secondary | ICD-10-CM | POA: Diagnosis present

## 2020-01-06 DIAGNOSIS — F17210 Nicotine dependence, cigarettes, uncomplicated: Secondary | ICD-10-CM | POA: Insufficient documentation

## 2020-01-06 DIAGNOSIS — Z20822 Contact with and (suspected) exposure to covid-19: Secondary | ICD-10-CM | POA: Diagnosis not present

## 2020-01-06 DIAGNOSIS — N184 Chronic kidney disease, stage 4 (severe): Secondary | ICD-10-CM | POA: Diagnosis present

## 2020-01-06 DIAGNOSIS — F17200 Nicotine dependence, unspecified, uncomplicated: Secondary | ICD-10-CM | POA: Diagnosis present

## 2020-01-06 DIAGNOSIS — D5700 Hb-SS disease with crisis, unspecified: Secondary | ICD-10-CM | POA: Diagnosis not present

## 2020-01-06 DIAGNOSIS — N179 Acute kidney failure, unspecified: Secondary | ICD-10-CM | POA: Diagnosis present

## 2020-01-06 DIAGNOSIS — F1910 Other psychoactive substance abuse, uncomplicated: Secondary | ICD-10-CM | POA: Diagnosis not present

## 2020-01-06 DIAGNOSIS — N189 Chronic kidney disease, unspecified: Secondary | ICD-10-CM | POA: Diagnosis present

## 2020-01-06 DIAGNOSIS — D72829 Elevated white blood cell count, unspecified: Secondary | ICD-10-CM | POA: Diagnosis present

## 2020-01-06 DIAGNOSIS — Z79899 Other long term (current) drug therapy: Secondary | ICD-10-CM | POA: Diagnosis not present

## 2020-01-06 DIAGNOSIS — M25561 Pain in right knee: Secondary | ICD-10-CM | POA: Diagnosis present

## 2020-01-06 LAB — CBC WITH DIFFERENTIAL/PLATELET
Abs Immature Granulocytes: 0.06 10*3/uL (ref 0.00–0.07)
Basophils Absolute: 0 10*3/uL (ref 0.0–0.1)
Basophils Relative: 0 %
Eosinophils Absolute: 0.2 10*3/uL (ref 0.0–0.5)
Eosinophils Relative: 1 %
HCT: 21.8 % — ABNORMAL LOW (ref 39.0–52.0)
Hemoglobin: 7.2 g/dL — ABNORMAL LOW (ref 13.0–17.0)
Immature Granulocytes: 0 %
Lymphocytes Relative: 36 %
Lymphs Abs: 5.2 10*3/uL — ABNORMAL HIGH (ref 0.7–4.0)
MCH: 33.6 pg (ref 26.0–34.0)
MCHC: 33 g/dL (ref 30.0–36.0)
MCV: 101.9 fL — ABNORMAL HIGH (ref 80.0–100.0)
Monocytes Absolute: 1.3 10*3/uL — ABNORMAL HIGH (ref 0.1–1.0)
Monocytes Relative: 9 %
Neutro Abs: 7.8 10*3/uL — ABNORMAL HIGH (ref 1.7–7.7)
Neutrophils Relative %: 54 %
Platelets: 265 10*3/uL (ref 150–400)
RBC: 2.14 MIL/uL — ABNORMAL LOW (ref 4.22–5.81)
RDW: 24.7 % — ABNORMAL HIGH (ref 11.5–15.5)
WBC: 14.6 10*3/uL — ABNORMAL HIGH (ref 4.0–10.5)
nRBC: 0.1 % (ref 0.0–0.2)

## 2020-01-06 LAB — URINALYSIS, ROUTINE W REFLEX MICROSCOPIC
Bilirubin Urine: NEGATIVE
Glucose, UA: 50 mg/dL — AB
Hgb urine dipstick: NEGATIVE
Ketones, ur: NEGATIVE mg/dL
Leukocytes,Ua: NEGATIVE
Nitrite: NEGATIVE
Protein, ur: 100 mg/dL — AB
Specific Gravity, Urine: 1.01 (ref 1.005–1.030)
pH: 8 (ref 5.0–8.0)

## 2020-01-06 LAB — RAPID URINE DRUG SCREEN, HOSP PERFORMED
Amphetamines: NOT DETECTED
Barbiturates: NOT DETECTED
Benzodiazepines: NOT DETECTED
Cocaine: NOT DETECTED
Opiates: POSITIVE — AB
Tetrahydrocannabinol: POSITIVE — AB

## 2020-01-06 LAB — COMPREHENSIVE METABOLIC PANEL
ALT: 43 U/L (ref 0–44)
AST: 65 U/L — ABNORMAL HIGH (ref 15–41)
Albumin: 3.7 g/dL (ref 3.5–5.0)
Alkaline Phosphatase: 320 U/L — ABNORMAL HIGH (ref 38–126)
Anion gap: 9 (ref 5–15)
BUN: 31 mg/dL — ABNORMAL HIGH (ref 6–20)
CO2: 25 mmol/L (ref 22–32)
Calcium: 8.6 mg/dL — ABNORMAL LOW (ref 8.9–10.3)
Chloride: 104 mmol/L (ref 98–111)
Creatinine, Ser: 2.62 mg/dL — ABNORMAL HIGH (ref 0.61–1.24)
GFR, Estimated: 31 mL/min — ABNORMAL LOW (ref >60)
Glucose, Bld: 102 mg/dL — ABNORMAL HIGH (ref 70–99)
Potassium: 5.4 mmol/L — ABNORMAL HIGH (ref 3.5–5.1)
Sodium: 138 mmol/L (ref 135–145)
Total Bilirubin: 1.4 mg/dL — ABNORMAL HIGH (ref 0.3–1.2)
Total Protein: 7.2 g/dL (ref 6.5–8.1)

## 2020-01-06 LAB — RETICULOCYTES
Immature Retic Fract: 29.1 % — ABNORMAL HIGH (ref 2.3–15.9)
RBC.: 2.14 MIL/uL — ABNORMAL LOW (ref 4.22–5.81)
Retic Count, Absolute: 136.7 10*3/uL (ref 19.0–186.0)
Retic Ct Pct: 6.4 % — ABNORMAL HIGH (ref 0.4–3.1)

## 2020-01-06 LAB — LACTATE DEHYDROGENASE: LDH: 194 U/L — ABNORMAL HIGH (ref 98–192)

## 2020-01-06 MED ORDER — SODIUM CHLORIDE 0.9% FLUSH: 10.0000 mL | INTRAVENOUS | Status: DC | PRN

## 2020-01-06 MED ORDER — ACETAMINOPHEN 500 MG PO TABS: 500.0000 mg | ORAL_TABLET | Freq: Four times a day (QID) | ORAL | Status: DC | PRN

## 2020-01-06 MED ORDER — ACETAMINOPHEN 500 MG PO TABS
1000.0000 mg | ORAL_TABLET | Freq: Once | ORAL | Status: AC
  Administered 2020-01-06: 1000 mg via ORAL
  Filled 2020-01-06: qty 2

## 2020-01-06 MED ORDER — POLYETHYLENE GLYCOL 3350 17 G PO PACK: 17.0000 g | PACK | Freq: Every day | ORAL | Status: DC | PRN

## 2020-01-06 MED ORDER — HYDROMORPHONE HCL 1 MG/ML IJ SOLN
1.0000 mg | Freq: Once | INTRAMUSCULAR | Status: AC
  Administered 2020-01-06: 1 mg via SUBCUTANEOUS
  Filled 2020-01-06 (×2): qty 1

## 2020-01-06 MED ORDER — DARBEPOETIN ALFA 200 MCG/0.4ML IJ SOSY
200.0000 ug | PREFILLED_SYRINGE | Freq: Once | INTRAMUSCULAR | Status: AC
  Administered 2020-01-06: 200 ug via SUBCUTANEOUS
  Filled 2020-01-06: qty 0.4

## 2020-01-06 MED ORDER — SODIUM CHLORIDE 0.9% FLUSH: 9.0000 mL | INTRAVENOUS | Status: DC | PRN

## 2020-01-06 MED ORDER — DIPHENHYDRAMINE HCL 25 MG PO CAPS
25.0000 mg | ORAL_CAPSULE | Freq: Once | ORAL | Status: AC
  Administered 2020-01-06: 25 mg via ORAL
  Filled 2020-01-06: qty 1

## 2020-01-06 MED ORDER — NALOXONE HCL 0.4 MG/ML IJ SOLN: 0.4000 mg | INTRAMUSCULAR | Status: DC | PRN

## 2020-01-06 MED ORDER — SODIUM BICARBONATE 650 MG PO TABS
650.0000 mg | ORAL_TABLET | Freq: Two times a day (BID) | ORAL | Status: DC
  Administered 2020-01-06 – 2020-01-07 (×2): 650 mg via ORAL
  Filled 2020-01-06 (×2): qty 1

## 2020-01-06 MED ORDER — FOLIC ACID 1 MG PO TABS
1.0000 mg | ORAL_TABLET | Freq: Every day | ORAL | Status: DC
  Administered 2020-01-07: 1 mg via ORAL
  Filled 2020-01-06: qty 1

## 2020-01-06 MED ORDER — SENNOSIDES-DOCUSATE SODIUM 8.6-50 MG PO TABS
1.0000 | ORAL_TABLET | Freq: Two times a day (BID) | ORAL | Status: DC
  Filled 2020-01-06 (×2): qty 1

## 2020-01-06 MED ORDER — SODIUM CHLORIDE 0.9% FLUSH: 10.0000 mL | Freq: Two times a day (BID) | INTRAVENOUS | Status: DC

## 2020-01-06 MED ORDER — OXYCODONE HCL ER 15 MG PO T12A
15.0000 mg | EXTENDED_RELEASE_TABLET | Freq: Two times a day (BID) | ORAL | Status: DC
  Administered 2020-01-06 – 2020-01-07 (×2): 15 mg via ORAL
  Filled 2020-01-06 (×2): qty 1

## 2020-01-06 MED ORDER — SODIUM CHLORIDE 0.45 % IV SOLN: INTRAVENOUS | Status: DC

## 2020-01-06 MED ORDER — HYDROMORPHONE 1 MG/ML IV SOLN
INTRAVENOUS | Status: DC
  Administered 2020-01-06: 11 mg via INTRAVENOUS
  Administered 2020-01-06: 1.54 mg via INTRAVENOUS
  Administered 2020-01-07: 3 mg via INTRAVENOUS
  Administered 2020-01-07: 4.9 mg via INTRAVENOUS
  Administered 2020-01-07: 2.5 mg via INTRAVENOUS
  Administered 2020-01-07: 4 mg via INTRAVENOUS
  Filled 2020-01-06: qty 25
  Filled 2020-01-06: qty 30

## 2020-01-06 MED ORDER — SODIUM ZIRCONIUM CYCLOSILICATE 10 G PO PACK
10.0000 g | PACK | Freq: Every day | ORAL | Status: DC
  Administered 2020-01-06: 10 g via ORAL
  Filled 2020-01-06 (×2): qty 1

## 2020-01-06 MED ORDER — ENOXAPARIN SODIUM 40 MG/0.4ML ~~LOC~~ SOLN
40.0000 mg | SUBCUTANEOUS | Status: DC
  Filled 2020-01-06: qty 0.4

## 2020-01-06 MED ORDER — HYDROMORPHONE HCL 1 MG/ML IJ SOLN
1.0000 mg | Freq: Once | INTRAMUSCULAR | Status: DC
  Filled 2020-01-06: qty 1

## 2020-01-06 NOTE — Telephone Encounter (Signed)
Patient called, requesting to come to the day hospital due to pain in the legs and swollen knees. The pain is rated at 10/10. Denied chest pain, fever, diarrhea, abdominal pain, nausea/vomitting. Screened negative for Covid-19 symptoms. Admitted to having means of transportation without driving self after treatment. Last took 10 mg of oxycontin and 15 mg of oxycodone at 22:00 pm yesterday. Per provider, patient can come to the day hospital for treatment. Patient notified, verbalized understanding.

## 2020-01-06 NOTE — Discharge Instructions (Signed)
Sickle Cell Anemia, Adult  Sickle cell anemia is a condition where your red blood cells are shaped like sickles. Red blood cells carry oxygen through the body. Sickle-shaped cells do not live as long as normal red blood cells. They also clump together and block blood from flowing through the blood vessels. This prevents the body from getting enough oxygen. Sickle cell anemia causes organ damage and pain. It also increases the risk of infection. Follow these instructions at home: Medicines  Take over-the-counter and prescription medicines only as told by your doctor.  If you were prescribed an antibiotic medicine, take it as told by your doctor. Do not stop taking the antibiotic even if you start to feel better.  If you develop a fever, do not take medicines to lower the fever right away. Tell your doctor about the fever. Managing pain, stiffness, and swelling  Try these methods to help with pain: ? Use a heating pad. ? Take a warm bath. ? Distract yourself, such as by watching TV. Eating and drinking  Drink enough fluid to keep your pee (urine) clear or pale yellow. Drink more in hot weather and during exercise.  Limit or avoid alcohol.  Eat a healthy diet. Eat plenty of fruits, vegetables, whole grains, and lean protein.  Take vitamins and supplements as told by your doctor. Traveling  When traveling, keep these with you: ? Your medical information. ? The names of your doctors. ? Your medicines.  If you need to take an airplane, talk to your doctor first. Activity  Rest often.  Avoid exercises that make your heart beat much faster, such as jogging. General instructions  Do not use products that have nicotine or tobacco, such as cigarettes and e-cigarettes. If you need help quitting, ask your doctor.  Consider wearing a medical alert bracelet.  Avoid being in high places (high altitudes), such as mountains.  Avoid very hot or cold temperatures.  Avoid places where the  temperature changes a lot.  Keep all follow-up visits as told by your doctor. This is important. Contact a doctor if:  A joint hurts.  Your feet or hands hurt or swell.  You feel tired (fatigued). Get help right away if:  You have symptoms of infection. These include: ? Fever. ? Chills. ? Being very tired. ? Irritability. ? Poor eating. ? Throwing up (vomiting).  You feel dizzy or faint.  You have new stomach pain, especially on the left side.  You have a an erection (priapism) that lasts more than 4 hours.  You have numbness in your arms or legs.  You have a hard time moving your arms or legs.  You have trouble talking.  You have pain that does not go away when you take medicine.  You are short of breath.  You are breathing fast.  You have a long-term cough.  You have pain in your chest.  You have a bad headache.  You have a stiff neck.  Your stomach looks bloated even though you did not eat much.  Your skin is pale.  You suddenly cannot see well. Summary  Sickle cell anemia is a condition where your red blood cells are shaped like sickles.  Follow your doctor's advice on ways to manage pain, food to eat, activities to do, and steps to take for safe travel.  Get medical help right away if you have any signs of infection, such as a fever. This information is not intended to replace advice given to you by   your health care provider. Make sure you discuss any questions you have with your health care provider. Document Revised: 05/24/2018 Document Reviewed: 03/07/2016 Elsevier Patient Education  2020 [employer]  

## 2020-01-06 NOTE — Progress Notes (Signed)
Patient admitted to the day hospital for treatment of sickle cell pain crisis. Patient reported pain rated 10/10 in the legs. Also, endorsed swollen knees. Patient placed on Dilaudid PCA, given PO Benadryl, PO Tylenol, sub Q Aranesp and hydrated with IV fluids.Transferred to [facility] room 08. Report was given to [facility]. Pain on transfer was 9/10.  Patient alert, oriented and transported in a wheelchair.

## 2020-01-07 ENCOUNTER — Other Ambulatory Visit: Payer: Self-pay

## 2020-01-07 DIAGNOSIS — N184 Chronic kidney disease, stage 4 (severe): Secondary | ICD-10-CM | POA: Diagnosis not present

## 2020-01-07 DIAGNOSIS — D72829 Elevated white blood cell count, unspecified: Secondary | ICD-10-CM

## 2020-01-07 DIAGNOSIS — N179 Acute kidney failure, unspecified: Secondary | ICD-10-CM

## 2020-01-07 DIAGNOSIS — D5700 Hb-SS disease with crisis, unspecified: Secondary | ICD-10-CM | POA: Diagnosis not present

## 2020-01-07 DIAGNOSIS — F17200 Nicotine dependence, unspecified, uncomplicated: Secondary | ICD-10-CM

## 2020-01-07 DIAGNOSIS — G894 Chronic pain syndrome: Secondary | ICD-10-CM | POA: Diagnosis not present

## 2020-01-07 LAB — CBC
HCT: 20.5 % — ABNORMAL LOW (ref 39.0–52.0)
HCT: 24.2 % — ABNORMAL LOW (ref 39.0–52.0)
Hemoglobin: 6.5 g/dL — CL (ref 13.0–17.0)
Hemoglobin: 8.3 g/dL — ABNORMAL LOW (ref 13.0–17.0)
MCH: 33.2 pg (ref 26.0–34.0)
MCH: 31.8 pg (ref 26.0–34.0)
MCHC: 31.7 g/dL (ref 30.0–36.0)
MCHC: 34.3 g/dL (ref 30.0–36.0)
MCV: 104.6 fL — ABNORMAL HIGH (ref 80.0–100.0)
MCV: 92.7 fL (ref 80.0–100.0)
Platelets: 231 10*3/uL (ref 150–400)
Platelets: 244 10*3/uL (ref 150–400)
RBC: 1.96 MIL/uL — ABNORMAL LOW (ref 4.22–5.81)
RBC: 2.61 MIL/uL — ABNORMAL LOW (ref 4.22–5.81)
RDW: 23.9 % — ABNORMAL HIGH (ref 11.5–15.5)
RDW: 22.7 % — ABNORMAL HIGH (ref 11.5–15.5)
WBC: 16.9 10*3/uL — ABNORMAL HIGH (ref 4.0–10.5)
WBC: 17.5 10*3/uL — ABNORMAL HIGH (ref 4.0–10.5)
nRBC: 0.1 % (ref 0.0–0.2)
nRBC: 0.1 % (ref 0.0–0.2)

## 2020-01-07 LAB — BASIC METABOLIC PANEL
Anion gap: 9 (ref 5–15)
BUN: 35 mg/dL — ABNORMAL HIGH (ref 6–20)
CO2: 22 mmol/L (ref 22–32)
Calcium: 8.6 mg/dL — ABNORMAL LOW (ref 8.9–10.3)
Chloride: 104 mmol/L (ref 98–111)
Creatinine, Ser: 2.45 mg/dL — ABNORMAL HIGH (ref 0.61–1.24)
GFR, Estimated: 34 mL/min — ABNORMAL LOW (ref >60)
Glucose, Bld: 95 mg/dL (ref 70–99)
Potassium: 4.8 mmol/L (ref 3.5–5.1)
Sodium: 135 mmol/L (ref 135–145)

## 2020-01-07 LAB — URINE CULTURE: Culture: NO GROWTH

## 2020-01-07 LAB — RESP PANEL BY RT-PCR (FLU A&B, COVID) ARPGX2
Influenza A by PCR: NEGATIVE
Influenza B by PCR: NEGATIVE
SARS Coronavirus 2 by RT PCR: NEGATIVE

## 2020-01-07 LAB — PREPARE RBC (CROSSMATCH)

## 2020-01-07 MED ORDER — ADULT MULTIVITAMIN W/MINERALS CH
1.0000 | ORAL_TABLET | Freq: Every day | ORAL | Status: DC
  Administered 2020-01-07: 1 via ORAL
  Filled 2020-01-07: qty 1

## 2020-01-07 MED ORDER — SODIUM CHLORIDE 0.9% IV SOLUTION: Freq: Once | INTRAVENOUS | Status: AC

## 2020-01-07 MED ORDER — SODIUM CHLORIDE 0.45 % IV SOLN: INTRAVENOUS | Status: DC

## 2020-01-07 MED ORDER — DIPHENHYDRAMINE HCL 50 MG/ML IJ SOLN
25.0000 mg | Freq: Once | INTRAMUSCULAR | Status: AC
  Administered 2020-01-07: 25 mg via INTRAVENOUS
  Filled 2020-01-07: qty 1

## 2020-01-07 MED ORDER — ACETAMINOPHEN 325 MG PO TABS
650.0000 mg | ORAL_TABLET | Freq: Once | ORAL | Status: AC
  Administered 2020-01-07: 650 mg via ORAL
  Filled 2020-01-07: qty 2

## 2020-01-07 NOTE — H&P (Signed)
H&P  Patient Demographics:  [name] [name], is a 39 y.o. male  MRN: [id number]   DOB - 1980/12/17  Admit Date - 01/06/2020  Outpatient Primary MD for the patient is [name], MD      HPI:   [name] [name]  is a 39 y.o. male with a medical history significant for sickle cell anemia type SS, chronic kidney disease stage IV, history of symptomatic anemia, opiate dependence and tolerance, and history of polysubstance abuse presents complaining of bilateral knee pain that is consistent with previous sickle cell pain crisis.  Patient says that pain intensity has been increased over the past 3 days and primarily to bilateral knees.  He endorses some swelling.  He attributes pain crisis to changes in weather.  He has been taking oxycodone and OxyContin consistently for this problem last taken this a.m. without sustained relief.  Pain intensity is 9/10 characterized as constant and throbbing.  Patient states that it has been difficult to transition from sitting to standing and bear weight.  Patient denies any injury or falls.  No subjective fever, chills, rigors.  No shortness of breath, headache, urinary symptoms, nausea, vomiting, or diarrhea.  Patient endorses fatigue.  No sick contacts, recent travel, or exposure to COVID-19.  Of note, patient has been vaccinated against COVID-19.  Sickle cell day infusion center course: Vital signs show temperature 97.7 F, blood pressure 107/60, heart rate 81, respirations 13, and oxygen saturation 100% on RA.  CBC is significant for: WBCs 14.6.  Patient's hemoglobin is 7.2, which is consistent with his baseline.  Patient has been receiving darbepoetin injections around every 3 weeks.  Potassium 5.4, sodium 138, chloride 104, CO2 25, glucose 102, BUN 31.  Creatinine elevated at 2.64, slightly above patient's baseline of 2.2-2.3.  Patient received darbepoetin 200 mcg today.  COVID-19 test pending.  Pain persists despite IV Dilaudid PCA, IV fluids, and  Tylenol.  Patient admitted to [facility] for further management of sickle cell pain crisis.   Review of systems:  Review of Systems  Constitutional: Positive for malaise/fatigue. Negative for chills and fever.  HENT: Negative.   Eyes: Negative for discharge and redness.  Cardiovascular: Negative.   Gastrointestinal: Negative for nausea and vomiting.  Genitourinary: Negative for dysuria, frequency, hematuria and urgency.  Musculoskeletal: Positive for back pain and joint pain (Bilateral knee pain).  Skin: Negative.   Neurological: Negative.   Endo/Heme/Allergies: Negative.   Psychiatric/Behavioral: Negative.     With Past History of the following :   Past Medical History:  Diagnosis Date  . Abscess of right iliac fossa 09/24/2018   required Perc Drain 09/24/2018  . Arachnoid Cyst of brain bilaterally    "2 really small ones in the back of my head; inside; saw them w/MRI" (09/25/2012)  . Bacterial pneumonia ~ 2012   "caught it here in the hospital" (09/25/2012)  . Chronic kidney disease    "from my sickle cell" (09/25/2012)  . CKD (chronic kidney disease), stage II   . Colitis 04/19/2017   CT scan abd/ pelvis  . GERD (gastroesophageal reflux disease)    "after I eat alot of spicey foods" (09/25/2012)  . Gynecomastia, male 07/10/2012  . History of blood transfusion    "always related to sickle cell crisis" (09/25/2012)  . 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   Noted in noted from H[facility] at [facility]  . Migraines    "take RX qd to prevent them" (09/25/2012)  . Opioid dependence with withdrawal ([place]) 08/30/2012  .  Renal insufficiency   . Sickle cell anemia (HCC)   . Sickle cell crisis ([place]) 09/25/2012  . Sickle cell nephropathy ([place]) 07/10/2012  . Sinus tachycardia   . Tachycardia with heart rate 121-140 beats per minute with ambulation 08/04/2016      Past Surgical History:  Procedure Laterality Date  . ABCESS DRAINAGE    . CHOLECYSTECTOMY  ~ 2012  .  COLONOSCOPY N/A 04/23/2017   Procedure: COLONOSCOPY;  Surgeon: Irene Shipper, MD;  Location: [facility];  Service: Endoscopy;  Laterality: N/A;  . IR FLUORO GUIDE CV LINE RIGHT  12/17/2016  . IR RADIOLOGIST EVAL & MGMT  10/02/2018  . IR RADIOLOGIST EVAL & MGMT  10/15/2018  . IR REMOVAL TUN CV CATH W/O FL  12/21/2016  . IR US GUIDE VASC ACCESS RIGHT  12/17/2016  . spleenectomy       Social History:   Social History   Tobacco Use  . Smoking status: Current Some Day Smoker    Packs/day: 0.10    Types: Cigarettes  . Smokeless tobacco: Never Used  . Tobacco comment: 09/25/2012 "I don't buy cigarettes; bum one from friends q now and then"  Substance Use Topics  . Alcohol use: Yes    Alcohol/week: 0.0 standard drinks    Comment: Once a month     Lives - At home   Family History :   Family History  Problem Relation Age of Onset  . Breast cancer Mother      Home Medications:   Prior to Admission medications   Medication Sig Start Date End Date Taking? Authorizing Provider  acetaminophen (TYLENOL) 500 MG tablet Take 1 tablet (500 mg total) by mouth every 6 (six) hours as needed. Patient taking differently: Take 500 mg by mouth every 6 (six) hours as needed for mild pain.  03/26/19  Yes [name], FNP  Cholecalciferol (VITAMIN D3) 50 MCG (2000 UT) TABS Take 1 each by mouth daily. 10/22/19  Yes [name], FNP  folic acid (FOLVITE) 1 MG tablet Take 1 tablet (1 mg total) by mouth daily. 03/11/19  Yes [name], FNP  nortriptyline (PAMELOR) 25 MG capsule TAKE 1 CAPSULE (25 MG TOTAL) BY MOUTH 2 (TWO) TIMES DAILY. Patient taking differently: Take 25 mg by mouth daily.  11/07/19  Yes [name], FNP  oxyCODONE (OXYCONTIN) 15 mg 12 hr tablet Take 1 tablet (15 mg total) by mouth every 12 (twelve) hours. 12/31/19 01/30/20 Yes [name], FNP  Oxycodone HCl 10 MG TABS Take 1 tablet (10 mg total) by mouth every 6 (six) hours as needed. 12/25/19  Yes [name], FNP  sodium bicarbonate 650 MG tablet Take 650 mg by mouth 2 (two) times daily. 11/07/18  Yes [provider]     Allergies:   Allergies  Allergen Reactions  . Nsaids Other (See Comments)    Kidney disease  . Morphine And Related Other (See Comments)    "Real bad headaches"      Physical Exam:   Vitals:   Vitals:   01/07/20 0444 01/07/20 0612  BP:  121/74  Pulse:  90  Resp: 14 14  Temp:  98.3 F (36.8 C)  SpO2: 98% 100%   Physical Exam Constitutional:      Appearance: Normal appearance.  HENT:     Head: Normocephalic.     Mouth/Throat:     Mouth: Mucous membranes are moist.     Pharynx: Oropharynx is clear.  Eyes:  General: Scleral icterus present.     Pupils: Pupils are equal, round, and reactive to light.  Cardiovascular:     Pulses: Normal pulses.  Pulmonary:     Effort: Pulmonary effort is normal.     Breath sounds: Normal breath sounds.  Abdominal:     General: Bowel sounds are normal.  Musculoskeletal:     Right knee: Swelling and bony tenderness present. No erythema. Decreased range of motion.     Left knee: Swelling and bony tenderness present. No erythema. Decreased range of motion.  Neurological:     General: No focal deficit present.     Mental Status: He is alert. Mental status is at baseline.  Psychiatric:        Mood and Affect: Mood normal.        Behavior: Behavior normal.        Thought Content: Thought content normal.        Judgment: Judgment normal.      Data Review:   CBC Recent Labs  Lab 01/06/20 0940  WBC 14.6*  HGB 7.2*  HCT 21.8*  PLT 265  MCV 101.9*  MCH 33.6  MCHC 33.0  RDW 24.7*  LYMPHSABS 5.2*  MONOABS 1.3*  EOSABS 0.2  BASOSABS 0.0   ------------------------------------------------------------------------------------------------------------------  Chemistries  Recent Labs  Lab 01/06/20 0940  NA 138  K 5.4*  CL 104  CO2 25  GLUCOSE 102*  BUN 31*  CREATININE 2.62*  CALCIUM 8.6*  AST  65*  ALT 43  ALKPHOS 320*  BILITOT 1.4*   ------------------------------------------------------------------------------------------------------------------ CrCl cannot be calculated (Unknown ideal weight.). ------------------------------------------------------------------------------------------------------------------ No results for input(s): TSH, T4TOTAL, T3FREE, THYROIDAB in the last 72 hours.  Invalid input(s): FREET3  Coagulation profile No results for input(s): INR, PROTIME in the last 168 hours. ------------------------------------------------------------------------------------------------------------------- No results for input(s): DDIMER in the last 72 hours. -------------------------------------------------------------------------------------------------------------------  Cardiac Enzymes No results for input(s): CKMB, TROPONINI, MYOGLOBIN in the last 168 hours.  Invalid input(s): CK ------------------------------------------------------------------------------------------------------------------ No results found for: BNP  ---------------------------------------------------------------------------------------------------------------  Urinalysis    Component Value Date/Time   COLORURINE YELLOW 01/06/2020 1030   APPEARANCEUR CLEAR 01/06/2020 1030   LABSPEC 1.010 01/06/2020 1030   PHURINE 8.0 01/06/2020 1030   GLUCOSEU 50 (A) 01/06/2020 1030   HGBUR NEGATIVE 01/06/2020 1030   HGBUR small 03/10/2010 1445   BILIRUBINUR NEGATIVE 01/06/2020 1030   BILIRUBINUR negative 10/14/2019 1030   KETONESUR NEGATIVE 01/06/2020 1030   PROTEINUR 100 (A) 01/06/2020 1030   UROBILINOGEN 0.2 10/14/2019 1030   UROBILINOGEN 0.2 05/11/2017 1335   NITRITE NEGATIVE 01/06/2020 1030   LEUKOCYTESUR NEGATIVE 01/06/2020 1030    ----------------------------------------------------------------------------------------------------------------   Imaging Results:    No results found.    Assessment & Plan:  Principal Problem:   Sickle cell pain crisis ([place]) Active Problems:   TOBACCO ABUSE   CKD (chronic kidney disease), stage IV (HCC)   Acute-on-chronic kidney injury (HCC)   Leukocytosis   Chronic pain syndrome  Sickle cell disease with pain crisis: Admit to [facility] for further management of sickle cell pain crisis.  Continue IV fluids, 0.45% saline at 50 mL/h. Continue IV Dilaudid PCA with settings of 0.5 mg, 10-minute lockout, and 3 mg/h. Hold IV Toradol due to history of AKI superimposed on CKD stage IV Tylenol 650 mg every 6 hours as needed Continue home medications including OxyContin 15 mg every 12 hours.  Hold oxycodone, use PCA Dilaudid. Monitor vital signs very closely, reevaluate pain scale regularly, and supplemental oxygen as needed.  Sickle cell  anemia: Hemoglobin 7.2, consistent with patient's baseline.  Received darbepoetin 200 mcg today.  No further indication for blood transfusion at this time.  Continue to follow closely.  Patient typically transfuse conservatively.  Hold transfusion unless hemoglobin is less than 6.0 g/dL. Continue folic acid daily  Acute kidney injury superimposed on chronic kidney disease stage IV: Continue Lokelma 10 g daily.  Gentle hydration.  Avoid all nephrotoxic medications.  Patient is followed by nephrology monthly as an outpatient.  Chronic pain syndrome: Continue home medications  Leukocytosis: WBCs 14.6.  Patient is afebrile without any signs of infection or inflammation.  More than likely secondary to vaso-occlusive pain crisis.  Continue to follow closely without antibiotics.  CBC in a.m.  Tobacco dependence: Smoking cessation instruction/counseling given:  counseled patient on the dangers of tobacco use, advised patient to stop smoking, and reviewed strategies to maximize success DVT Prophylaxis: Subcut Lovenox   AM Labs Ordered, also please review Full Orders  Family Communication: Admission, patient's  condition and plan of care including tests being ordered have been discussed with the patient who indicate understanding and agree with the plan and Code Status.  Code Status: Full Code  Consults called: None    Admission status: Inpatient    Time spent in minutes : 35 minutes  [place], MSN, FNP-C [facility] [address] [place], [place] [address] [phone] 01/07/2020 at 6:33 AM

## 2020-01-07 NOTE — Care Management CC44 (Signed)
Condition Code 44 Documentation Completed  Patient Details  Name: [name] [name]. MRN: [id number] Date of Birth: [birth year]   Condition Code 44 given:  Yes Patient signature on Condition Code 44 notice:  Yes Documentation of 2 MD's agreement:  Yes Code 44 added to claim:  Yes    Lynnell Catalan, RN 01/07/2020, 3:40 PM

## 2020-01-07 NOTE — Discharge Summary (Signed)
Physician Discharge Summary  [name] [name]. OMV:[id number] DOB: 1980/09/10 DOA: 01/06/2020  PCP: [name], MD  Admit date: 01/06/2020  Discharge date: 01/07/2020  Discharge Diagnoses:  Principal Problem:   Sickle cell pain crisis ([place]) Active Problems:   TOBACCO ABUSE   CKD (chronic kidney disease), stage IV (HCC)   Acute-on-chronic kidney injury ([place])   Leukocytosis   Sickle cell crisis (HCC)   Chronic pain syndrome   Discharge Condition: Stable  Disposition:   Follow-up Information    [name], MD. Schedule an appointment as soon as possible for a visit in 1 week(s).   Specialty: Internal Medicine Contact information: [place] [address] [phone]        Buford Dresser, MD .   Specialty: Cardiology Contact information: [address] [place] [address] [phone]              Pt is discharged home in good condition and is to follow up with [name], MD this week to have labs evaluated. [name] A Oliveira Jr. is instructed to increase activity slowly and balance with rest for the next few days, and use prescribed medication to complete treatment of pain  Diet: Regular Wt Readings from Last 3 Encounters:  01/07/20 54 kg  12/01/19 51.3 kg  10/14/19 53.5 kg    History of present illness:  [name] [name]  is a 39 y.o. male with a medical history significant for sickle cell anemia type SS, chronic kidney disease stage IV, history of symptomatic anemia, opiate dependence and tolerance, and history of polysubstance abuse presents complaining of bilateral knee pain that is consistent with previous sickle cell pain crisis.  Patient says that pain intensity has been increased over the past 3 days and primarily to bilateral knees.  He endorses some swelling.  He attributes pain crisis to changes in weather.  He has been taking oxycodone and OxyContin consistently for this  problem last taken this a.m. without sustained relief.  Pain intensity is 9/10 characterized as constant and throbbing.  Patient states that it has been difficult to transition from sitting to standing and bear weight.  Patient denies any injury or falls.  No subjective fever, chills, rigors.  No shortness of breath, headache, urinary symptoms, nausea, vomiting, or diarrhea.  Patient endorses fatigue.  No sick contacts, recent travel, or exposure to COVID-19.  Of note, patient has been vaccinated against COVID-19.  Sickle cell day infusion center course: Vital signs show temperature 97.7 F, blood pressure 107/60, heart rate 81, respirations 13, and oxygen saturation 100% on RA.  CBC is significant for: WBCs 14.6.  Patient's hemoglobin is 7.2, which is consistent with his baseline.  Patient has been receiving darbepoetin injections around every 3 weeks.  Potassium 5.4, sodium 138, chloride 104, CO2 25, glucose 102, BUN 31.  Creatinine elevated at 2.64, slightly above patient's baseline of 2.2-2.3.  Patient received darbepoetin 200 mcg today.  COVID-19 test pending.  Pain persists despite IV Dilaudid PCA, IV fluids, and Tylenol.  Patient admitted to [facility] for further management of sickle cell pain crisis  Hospital Course:  Patient was admitted for observation for sickle cell pain crisis and managed appropriately with IVF, IV Dilaudid via PCA as well as other adjunct therapies per sickle cell pain management protocols. IV Toradol is contraindicated due to CKD.  Patient's hemoglobin dropped to 6.5 for which he was transfused with 1 unit of packed red blood cell.  Patient did  really well, pain quickly returned to baseline, and he requested to be discharged to be home for Thanksgiving.  He was ambulating well with no significant pain, tolerating p.o. intake with no restrictions.  He remained hemodynamically stable throughout this admission. Patient was therefore discharged home today in a hemodynamically stable  condition.   [name] [name] will follow-up with PCP within 1 week of this discharge as well as follow-up with nephrologist as scheduled. [name] [name] was counseled extensively about nonpharmacologic means of pain management, patient verbalized understanding and was appreciative of  the care received during this admission.   We discussed the need for good hydration, monitoring of hydration status, avoidance of heat, cold, stress, and infection triggers. We discussed the need to be adherent with taking Hydrea and other home medications. Patient was reminded of the need to seek medical attention immediately if any symptom of bleeding, anemia, or infection occurs.  Discharge Exam: Vitals:   01/07/20 1346 01/07/20 1352  BP: 108/84   Pulse: 80   Resp:  18  Temp: 98.5 F (36.9 C)   SpO2: 98% 99%   Vitals:   01/07/20 1100 01/07/20 1145 01/07/20 1346 01/07/20 1352  BP: 109/75 109/77 108/84   Pulse: 94 86 80   Resp: 14 14  18   Temp: 98.1 F (36.7 C) 98.2 F (36.8 C) 98.5 F (36.9 C)   TempSrc:  Oral Oral   SpO2: 97% 99% 98% 99%  Weight:      Height:       General appearance : Awake, alert, not in any distress. Speech Clear. Not toxic looking HEENT: Atraumatic and Normocephalic, pupils equally reactive to light and accomodation Neck: Supple, no JVD. No cervical lymphadenopathy.  Chest: Good air entry bilaterally, no added sounds  CVS: S1 S2 regular, no murmurs.  Abdomen: Bowel sounds present, Non tender and not distended with no gaurding, rigidity or rebound. Extremities: B/L Lower Ext shows no edema, both legs are warm to touch Neurology: Awake alert, and oriented X 3, CN II-XII intact, Non focal Skin: No Rash  Discharge Instructions  Discharge Instructions    Diet - low sodium heart healthy   Complete by: As directed    Increase activity slowly   Complete by: As directed      Allergies as of 01/07/2020      Reactions   Nsaids Other (See Comments)   Kidney disease   Morphine And  Related Other (See Comments)   "Real bad headaches"       Medication List    TAKE these medications   acetaminophen 500 MG tablet Commonly known as: TYLENOL Take 1 tablet (500 mg total) by mouth every 6 (six) hours as needed. What changed: reasons to take this   folic acid 1 MG tablet Commonly known as: FOLVITE Take 1 tablet (1 mg total) by mouth daily.   nortriptyline 25 MG capsule Commonly known as: PAMELOR TAKE 1 CAPSULE (25 MG TOTAL) BY MOUTH 2 (TWO) TIMES DAILY. What changed: when to take this   Oxycodone HCl 10 MG Tabs Take 1 tablet (10 mg total) by mouth every 6 (six) hours as needed.   oxyCODONE 15 mg 12 hr tablet Commonly known as: OxyCONTIN Take 1 tablet (15 mg total) by mouth every 12 (twelve) hours.   sodium bicarbonate 650 MG tablet Take 650 mg by mouth 2 (two) times daily.   Vitamin D3 50 MCG (2000 UT) Tabs Take 1 each by mouth daily.       The results of significant  diagnostics from this hospitalization (including imaging, microbiology, ancillary and laboratory) are listed below for reference.    Significant Diagnostic Studies: No results found.  Microbiology: Recent Results (from the past 240 hour(s))  Urine Culture     Status: None   Collection Time: 01/06/20 10:30 AM   Specimen: Urine, Clean Catch  Result Value Ref Range Status   Specimen Description   Final    URINE, CLEAN CATCH Performed at [facility], [place] [address] [place], [place] [address]    Special Requests NONE  Final   Culture   Final    NO GROWTH Performed at [facility], [place] [address], [place], [place] [address]    Report Status 01/07/2020 FINAL  Final  Resp Panel by RT-PCR (Flu A&B, Covid) Nasopharyngeal Swab     Status: None   Collection Time: 01/07/20  8:28 AM   Specimen: Nasopharyngeal Swab; Nasopharyngeal(NP) swabs in vial transport medium  Result Value Ref Range Status   SARS Coronavirus 2 by RT PCR NEGATIVE NEGATIVE Final    Comment:  (NOTE) SARS-CoV-2 target nucleic acids are NOT DETECTED.  The SARS-CoV-2 RNA is generally detectable in upper respiratory specimens during the acute phase of infection. The lowest concentration of SARS-CoV-2 viral copies this assay can detect is 138 copies/mL. A negative result does not preclude SARS-Cov-2 infection and should not be used as the sole basis for treatment or other patient management decisions. A negative result may occur with  improper specimen collection/handling, submission of specimen other than nasopharyngeal swab, presence of viral mutation(s) within the areas targeted by this assay, and inadequate number of viral copies(<138 copies/mL). A negative result must be combined with clinical observations, patient history, and epidemiological information. The expected result is Negative.  Fact Sheet for Patients:  EntrepreneurPulse.com.au  Fact Sheet for Healthcare Providers:  IncredibleEmployment.be  This test is no t yet approved or cleared by the [place] FDA and  has been authorized for detection and/or diagnosis of SARS-CoV-2 by FDA under an Emergency Use Authorization (EUA). This EUA will remain  in effect (meaning this test can be used) for the duration of the COVID-19 declaration under Section 564(b)(1) of the Act, 21 U.S.C.section 360bbb-3(b)(1), unless the authorization is terminated  or revoked sooner.       Influenza A by PCR NEGATIVE NEGATIVE Final   Influenza B by PCR NEGATIVE NEGATIVE Final    Comment: (NOTE) The Xpert Xpress SARS-CoV-2/FLU/RSV plus assay is intended as an aid in the diagnosis of influenza from Nasopharyngeal swab specimens and should not be used as a sole basis for treatment. Nasal washings and aspirates are unacceptable for Xpert Xpress SARS-CoV-2/FLU/RSV testing.  Fact Sheet for Patients: EntrepreneurPulse.com.au  Fact Sheet for Healthcare  Providers: IncredibleEmployment.be  This test is not yet approved or cleared by the [place] FDA and has been authorized for detection and/or diagnosis of SARS-CoV-2 by FDA under an Emergency Use Authorization (EUA). This EUA will remain in effect (meaning this test can be used) for the duration of the COVID-19 declaration under Section 564(b)(1) of the Act, 21 U.S.C. section 360bbb-3(b)(1), unless the authorization is terminated or revoked.  Performed at [facility], [place] [address], [place], [place] [address]      Labs: Basic Metabolic Panel: Recent Labs  Lab 01/06/20 0940 01/07/20 0708  NA 138 135  K 5.4* 4.8  CL 104 104  CO2 25 22  GLUCOSE 102* 95  BUN 31* 35*  CREATININE 2.62* 2.45*  CALCIUM 8.6* 8.6*   Liver Function Tests: Recent Labs  Lab 01/06/20 0940  AST 65*  ALT 43  ALKPHOS 320*  BILITOT 1.4*  PROT 7.2  ALBUMIN 3.7   No results for input(s): LIPASE, AMYLASE in the last 168 hours. No results for input(s): AMMONIA in the last 168 hours. CBC: Recent Labs  Lab 01/06/20 0940 01/07/20 0708  WBC 14.6* 16.9*  NEUTROABS 7.8*  --   HGB 7.2* 6.5*  HCT 21.8* 20.5*  MCV 101.9* 104.6*  PLT 265 231   Cardiac Enzymes: No results for input(s): CKTOTAL, CKMB, CKMBINDEX, TROPONINI in the last 168 hours. BNP: Invalid input(s): POCBNP CBG: No results for input(s): GLUCAP in the last 168 hours.  Time coordinating discharge: 50 minutes  Signed:  [facility] 01/07/2020, 3:38 PM

## 2020-01-07 NOTE — Care Management Obs Status (Signed)
[facility] NOTIFICATION   Patient Details  Name: [name] [name]. MRN: [id number] Date of Birth: [birth year]   Medicare Observation Status Notification Given:  Yes    Lynnell Catalan, RN 01/07/2020, 3:39 PM

## 2020-01-07 NOTE — Progress Notes (Signed)
Initial Nutrition Assessment  DOCUMENTATION CODES:   Not applicable  INTERVENTION:   -Magic cup TID with meals, each supplement provides 290 kcal and 9 grams of protein -MVI with minerals daily  NUTRITION DIAGNOSIS:   Increased nutrient needs related to acute illness (sickle cell crisis) as evidenced by estimated needs.  GOAL:   Patient will meet greater than or equal to 90% of their needs  MONITOR:   PO intake, Supplement acceptance, Labs, Weight trends, Skin, I & O's  REASON FOR ASSESSMENT:   Malnutrition Screening Tool    ASSESSMENT:   [name] [name]  is a 39 y.o. male with a medical history significant for sickle cell anemia type SS, chronic kidney disease stage IV, history of symptomatic anemia, opiate dependence and tolerance, and history of polysubstance abuse presents complaining of bilateral knee pain that is consistent with previous sickle cell pain crisis.  Pt admitted with sickle cell disease with pain crisis.   Reviewed I/O's: -280 ml x 24 hours  UOP: 700 ml x 24 hours  Attempted to speak with pt via call to hospital room x 2, however, unable to reach.   No meal completion records currently available to assess. Per previous RD notes, pt typically with poor appetite when experiencing sickle cell crisis. He does not accept Ensure Enlive supplements well historically.   Reviewed wt hx; wt has been stable over the past 6 months.   Medications reviewed and include folic acid, dilaudid, senokot, and lokelma.   Labs reviewed.   Diet Order:   Diet Order            Diet regular Room service appropriate? Yes; Fluid consistency: Thin  Diet effective now                 EDUCATION NEEDS:   No education needs have been identified at this time  Skin:  Skin Assessment: Reviewed RN Assessment  Last BM:  1123/21  Height:   Ht Readings from Last 1 Encounters:  01/07/20 5\' 3"  (1.6 m)    Weight:   Wt Readings from Last 1 Encounters:  01/07/20 54 kg     Ideal Body Weight:  56.4 kg  BMI:  Body mass index is 21.09 kg/m.  Estimated Nutritional Needs:   Kcal:  1650-1850  Protein:  80-95 grams  Fluid:  > 1.6 L    Loistine Chance, RD, LDN, [facility] Registered Dietitian II Certified Diabetes Care and Education Specialist Please refer to [facility] for RD and/or RD on-call/weekend/after hours pager

## 2020-01-07 NOTE — Plan of Care (Signed)
  Problem: Respiratory: Goal: Pulmonary complications will be avoided or minimized Outcome: Progressing   Problem: Respiratory: Goal: Acute Chest Syndrome will be identified early to prevent complications Outcome: Progressing

## 2020-01-08 LAB — BPAM RBC
Blood Product Expiration Date: 202112282359
ISSUE DATE / TIME: 202111241114
Unit Type and Rh: 9500

## 2020-01-08 LAB — TYPE AND SCREEN
ABO/RH(D): O POS
Antibody Screen: NEGATIVE
Donor AG Type: NEGATIVE
Unit division: 0

## 2020-01-10 ENCOUNTER — Other Ambulatory Visit: Payer: Self-pay | Admitting: Family Medicine

## 2020-01-10 DIAGNOSIS — G894 Chronic pain syndrome: Secondary | ICD-10-CM

## 2020-01-10 DIAGNOSIS — Z8669 Personal history of other diseases of the nervous system and sense organs: Secondary | ICD-10-CM

## 2020-01-12 NOTE — Telephone Encounter (Signed)
Please see [name] [name]'s patient's refill request

## 2020-01-13 ENCOUNTER — Encounter: Payer: Self-pay | Admitting: Family Medicine

## 2020-01-13 ENCOUNTER — Ambulatory Visit (INDEPENDENT_AMBULATORY_CARE_PROVIDER_SITE_OTHER): Payer: Medicare Other | Admitting: Family Medicine

## 2020-01-13 VITALS — BP 122/83 | HR 84 | Temp 98.2°F | Resp 18 | Ht 63.0 in | Wt 128.6 lb

## 2020-01-13 DIAGNOSIS — E559 Vitamin D deficiency, unspecified: Secondary | ICD-10-CM

## 2020-01-13 DIAGNOSIS — D5700 Hb-SS disease with crisis, unspecified: Secondary | ICD-10-CM | POA: Diagnosis not present

## 2020-01-13 DIAGNOSIS — Z23 Encounter for immunization: Secondary | ICD-10-CM

## 2020-01-13 DIAGNOSIS — N184 Chronic kidney disease, stage 4 (severe): Secondary | ICD-10-CM | POA: Diagnosis not present

## 2020-01-13 LAB — POCT URINALYSIS DIP (CLINITEK)
Bilirubin, UA: NEGATIVE
Blood, UA: NEGATIVE
Glucose, UA: NEGATIVE mg/dL
Ketones, POC UA: NEGATIVE mg/dL
Leukocytes, UA: NEGATIVE
Nitrite, UA: NEGATIVE
POC PROTEIN,UA: >=300 — AB
Spec Grav, UA: 1.015 (ref 1.010–1.025)
Urobilinogen, UA: 0.2 E.U./dL
pH, UA: 6.5 (ref 5.0–8.0)

## 2020-01-13 MED ORDER — VITAMIN D3 50 MCG (2000 UT) PO TABS: 1.0000 | ORAL_TABLET | Freq: Every day | ORAL | 11 refills | Status: DC

## 2020-01-13 NOTE — Progress Notes (Signed)
[facility]   Established Patient Office Visit  Subjective:  Patient ID: [name] [name]., male    DOB: 11/16/1980  Age: 39 y.o. MRN: [id number]  CC:  Chief Complaint  Patient presents with  . Follow-up    HPI [name] [name]. is a 39 year old male with a medical history significant for sickle cell anemia type SS, chronic kidney disease stage IV, history of symptomatic anemia, opiate dependence and tolerance, history of polysubstance abuse presents for follow-up of chronic conditions.  Patient is complaining of bilateral lower extremity pain today.  Current pain intensity is 6/10.  Patient did not take opiate medications prior to arrival.  He attributes increased pain to changes in weather.  He last had oxycodone last night without sustained relief.  He denies any headache, chest pain, shortness of breath, urinary symptoms, nausea, vomiting, or diarrhea.  Patient does endorse some lower extremity swelling.  He states that it is been difficult to transition from sitting to standing and bear weight on lower extremities.  He denies any injuries or falls.   Past Medical History:  Diagnosis Date  . Abscess of right iliac fossa 09/24/2018   required Perc Drain 09/24/2018  . Arachnoid Cyst of brain bilaterally    "2 really small ones in the back of my head; inside; saw them w/MRI" (09/25/2012)  . Bacterial pneumonia ~ 2012   "caught it here in the hospital" (09/25/2012)  . Chronic kidney disease    "from my sickle cell" (09/25/2012)  . CKD (chronic kidney disease), stage II   . Colitis 04/19/2017   CT scan abd/ pelvis  . GERD (gastroesophageal reflux disease)    "after I eat alot of spicey foods" (09/25/2012)  . Gynecomastia, male 07/10/2012  . History of blood transfusion    "always related to sickle cell crisis" (09/25/2012)  . 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   Noted in noted from Hematology notes at [facility]   . Migraines    "take RX qd to prevent them" (09/25/2012)  . Opioid dependence with withdrawal (HCC) 08/30/2012  . Renal insufficiency   . Sickle cell anemia (HCC)   . Sickle cell crisis (HCC) 09/25/2012  . Sickle cell nephropathy (HCC) 07/10/2012  . Sinus tachycardia   . Tachycardia with heart rate 121-140 beats per minute with ambulation 08/04/2016    Past Surgical History:  Procedure Laterality Date  . ABCESS DRAINAGE    . CHOLECYSTECTOMY  ~ 2012  . COLONOSCOPY N/A 04/23/2017   Procedure: COLONOSCOPY;  Surgeon: Hilarie Fredrickson, MD;  Location: [facility];  Service: Endoscopy;  Laterality: N/A;  . IR FLUORO GUIDE CV LINE RIGHT  12/17/2016  . IR RADIOLOGIST EVAL & MGMT  10/02/2018  . IR RADIOLOGIST EVAL & MGMT  10/15/2018  . IR REMOVAL TUN CV CATH W/O FL  12/21/2016  . IR US GUIDE VASC ACCESS RIGHT  12/17/2016  . spleenectomy      Family History  Problem Relation Age of Onset  . Breast cancer Mother     Social History   Socioeconomic History  . Marital status: Single    Spouse name: Not on file  . Number of children: Not on file  . Years of education: Not on file  . Highest education level: Not on file  Occupational History  . Not on file  Tobacco Use  . Smoking status: Current Some Day Smoker    Packs/day: 0.10    Types: Cigarettes  .  Smokeless tobacco: Never Used  . Tobacco comment: 09/25/2012 "I don't buy cigarettes; bum one from friends q now and then"  Vaping Use  . Vaping Use: Never used  Substance and Sexual Activity  . Alcohol use: Yes    Alcohol/week: 0.0 standard drinks    Comment: Once a month  . Drug use: Yes    Types: Marijuana    Comment: Every 2-3 weeks   . Sexual activity: Yes  Other Topics Concern  . Not on file  Social History Narrative    Lives alone in [place].   Back at school, studying Patent attorney. Unemployed.    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    Social Determinants of Health   Financial Resource Strain:   . Difficulty of Paying Living Expenses: Not on file  Food Insecurity:   . Worried About Programme researcher, broadcasting/film/video in the Last Year: Not on file  . Ran Out of Food in the Last Year: Not on file  Transportation Needs:   . Lack of Transportation (Medical): Not on file  . Lack of Transportation (Non-Medical): Not on file  Physical Activity:   . Days of Exercise per Week: Not on file  . Minutes of Exercise per Session: Not on file  Stress:   . Feeling of Stress : Not on file  Social Connections:   . Frequency of Communication with Friends and Family: Not on file  . Frequency of Social Gatherings with Friends and Family: Not on file  . Attends Religious Services: Not on file  . Active Member of Clubs or Organizations: Not on file  . Attends Banker Meetings: Not on file  . Marital Status: Not on file  Intimate Partner Violence:   . Fear of Current or Ex-Partner: Not on file  . Emotionally Abused: Not on file  . Physically Abused: Not on file  . Sexually Abused: Not on file    Outpatient Medications Prior to Visit  Medication Sig Dispense Refill  . acetaminophen (TYLENOL) 500 MG tablet Take 1 tablet (500 mg total) by mouth every 6 (six) hours as needed. (Patient taking differently: Take 500 mg by mouth every 6 (six) hours as needed for mild pain. ) 30 tablet 0  . Cholecalciferol (VITAMIN D3) 50 MCG (2000 UT) TABS Take 1 each by mouth daily. 30 tablet 11  . folic acid (FOLVITE) 1 MG tablet Take 1 tablet (1 mg total) by mouth daily. 30 tablet 12  . nortriptyline (PAMELOR) 25 MG capsule TAKE 1 CAPSULE (25 MG TOTAL) BY MOUTH 2 (TWO) TIMES DAILY. 60 capsule 1  . oxyCODONE (OXYCONTIN) 15 mg 12 hr tablet Take 1 tablet (15 mg total) by mouth every 12 (twelve) hours. 60 tablet 0  . Oxycodone HCl 10 MG TABS Take 1 tablet (10 mg total) by mouth every 6 (six) hours as  needed. 60 tablet 0  . sodium bicarbonate 650 MG tablet Take 650 mg by mouth 2 (two) times daily.     No facility-administered medications prior to visit.    Allergies  Allergen Reactions  . Nsaids Other (See Comments)    Kidney disease  . Morphine And Related Other (See Comments)    "Real bad headaches"     ROS Review of Systems  Constitutional: Negative for activity change, appetite change and chills.  HENT: Negative.  Eyes: Negative.   Respiratory: Negative.   Cardiovascular: Negative.   Endocrine: Negative.   Genitourinary: Negative.   Musculoskeletal: Positive for arthralgias and back pain.  Neurological: Negative.   Hematological: Negative.   Psychiatric/Behavioral: Negative.       Objective:    Physical Exam Constitutional:      Appearance: Normal appearance.  Eyes:     Pupils: Pupils are equal, round, and reactive to light.  Cardiovascular:     Rate and Rhythm: Normal rate and regular rhythm.     Heart sounds: Murmur heard.    Pulmonary:     Effort: Pulmonary effort is normal.  Abdominal:     General: Abdomen is flat. Bowel sounds are normal.     Palpations: Abdomen is soft.  Musculoskeletal:        General: Normal range of motion.  Skin:    General: Skin is warm.  Neurological:     General: No focal deficit present.     Mental Status: He is alert. Mental status is at baseline.  Psychiatric:        Mood and Affect: Mood normal.        Behavior: Behavior normal.        Thought Content: Thought content normal.        Judgment: Judgment normal.     BP 122/83 (BP Location: Right Arm, Patient Position: Sitting, Cuff Size: Normal)   Pulse 84   Temp 98.2 F (36.8 C) (Temporal)   Resp 18   Ht 5\' 3"  (1.6 m)   Wt 128 lb 9.6 oz (58.3 kg)   SpO2 100%   BMI 22.78 kg/m  Wt Readings from Last 3 Encounters:  01/13/20 128 lb 9.6 oz (58.3 kg)  01/07/20 119 lb 0.8 oz (54 kg)  12/01/19 113 lb 1.5 oz (51.3 kg)     Health Maintenance Due  Topic Date  Due  . INFLUENZA VACCINE  09/14/2019    There are no preventive care reminders to display for this patient.  Lab Results  Component Value Date   TSH 2.860 05/11/2017   Lab Results  Component Value Date   WBC 17.5 (H) 01/07/2020   HGB 8.3 (L) 01/07/2020   HCT 24.2 (L) 01/07/2020   MCV 92.7 01/07/2020   PLT 244 01/07/2020   Lab Results  Component Value Date   NA 135 01/07/2020   K 4.8 01/07/2020   CO2 22 01/07/2020   GLUCOSE 95 01/07/2020   BUN 35 (H) 01/07/2020   CREATININE 2.45 (H) 01/07/2020   BILITOT 1.4 (H) 01/06/2020   ALKPHOS 320 (H) 01/06/2020   AST 65 (H) 01/06/2020   ALT 43 01/06/2020   PROT 7.2 01/06/2020   ALBUMIN 3.7 01/06/2020   CALCIUM 8.6 (L) 01/07/2020   ANIONGAP 9 01/07/2020   Lab Results  Component Value Date   CHOL 145 03/07/2018   Lab Results  Component Value Date   HDL 65 03/07/2018   Lab Results  Component Value Date   LDLCALC 30 03/07/2018   Lab Results  Component Value Date   TRIG 251 (H) 03/07/2018   Lab Results  Component Value Date   CHOLHDL 2.2 03/07/2018   Lab Results  Component Value Date   HGBA1C <4.2 (L) 03/07/2018      Assessment & Plan:   Problem List Items Addressed This Visit      Genitourinary   CKD (chronic kidney disease), stage IV (HCC)   Relevant Orders   POCT URINALYSIS DIP (CLINITEK)  Other   Sickle-cell disease with pain (HCC)   Relevant Orders   POCT URINALYSIS DIP (CLINITEK)    Other Visit Diagnoses    Flu vaccine need    -  Primary   Relevant Orders   Flu Vaccine QUAD 6+ mos PF IM (Fluarix Quad PF)    1. Flu vaccine need - Flu Vaccine QUAD 6+ mos PF IM (Fluarix Quad PF)  2. Sickle-cell disease with pain (HCC) 1. Sickle cell disease - Continue Hydrea.  We discussed the need for good hydration, monitoring of hydration status, avoidance of heat, cold, stress, and infection triggers. We discussed the risks and benefits of Hydrea, including bone marrow suppression, the possibility of GI  upset, skin ulcers, hair thinning, and teratogenicity. The patient was reminded of the need to seek medical attention of any symptoms of bleeding, anemia, or infection. Continue folic acid 1 mg daily to prevent aplastic bone marrow crises.   2. Pulmonary evaluation - Patient denies severe recurrent wheezes, shortness of breath with exercise, or persistent cough. If these symptoms develop, pulmonary function tests with spirometry will be ordered, and if abnormal, plan on referral to Pulmonology for further evaluation.  3. Cardiac - Routine screening for pulmonary hypertension is not recommended.  4. Eye - High risk of proliferative retinopathy. Annual eye exam with retinal exam recommended to patient.  5. Immunization status - Influenza vaccine received today.   6. Acute and chronic painful episodes -  Pain controlled on current medication regimen, no changes warranted.     Patient will follow up at sickle cell day clinic for Aranesp injection every 2 weeks.  To receive Aranesp injection if hemoglobin is less than 8.0 g/dL.  - POCT URINALYSIS DIP (CLINITEK)  3. CKD (chronic kidney disease), stage IV (HCC) - POCT URINALYSIS DIP (CLINITEK) - 469629 11+Oxyco+Alc+Crt-Bund  4. Vitamin D deficiency m.  Once that - Cholecalciferol (VITAMIN D3) 50 MCG (2000 UT) TABS; Take 1 each by mouth daily.  Dispense: 30 tablet; Refill: 11  Follow-up: 3 months for chronic conditions   Kwesi Sangha Rennis Petty  APRN, MSN, FNP-C [facility] [address] [place], [place] [address] [phone]

## 2020-01-13 NOTE — Progress Notes (Signed)
Needs RF on vitamin D   Flu vaccine today

## 2020-01-19 LAB — DRUG SCREEN 764883 11+OXYCO+ALC+CRT-BUND

## 2020-01-20 ENCOUNTER — Encounter (HOSPITAL_COMMUNITY): Payer: Medicare Other

## 2020-01-20 DIAGNOSIS — M90569 Osteonecrosis in diseases classified elsewhere, unspecified lower leg: Secondary | ICD-10-CM | POA: Diagnosis not present

## 2020-01-20 DIAGNOSIS — M25461 Effusion, right knee: Secondary | ICD-10-CM | POA: Diagnosis not present

## 2020-01-20 DIAGNOSIS — D582 Other hemoglobinopathies: Secondary | ICD-10-CM | POA: Diagnosis not present

## 2020-01-20 DIAGNOSIS — M65861 Other synovitis and tenosynovitis, right lower leg: Secondary | ICD-10-CM | POA: Diagnosis not present

## 2020-01-20 DIAGNOSIS — M8788 Other osteonecrosis, other site: Secondary | ICD-10-CM | POA: Diagnosis not present

## 2020-01-20 DIAGNOSIS — R936 Abnormal findings on diagnostic imaging of limbs: Secondary | ICD-10-CM | POA: Diagnosis not present

## 2020-01-22 ENCOUNTER — Telehealth: Payer: Self-pay | Admitting: Family Medicine

## 2020-01-22 ENCOUNTER — Other Ambulatory Visit: Payer: Self-pay

## 2020-01-22 ENCOUNTER — Other Ambulatory Visit: Payer: Self-pay | Admitting: Family Medicine

## 2020-01-22 ENCOUNTER — Ambulatory Visit (HOSPITAL_COMMUNITY)
Admission: RE | Admit: 2020-01-22 | Discharge: 2020-01-22 | Disposition: A | Discharge status: Home / Self Care | Payer: Medicare Other | Attending: Internal Medicine | Admitting: Internal Medicine

## 2020-01-22 DIAGNOSIS — D571 Sickle-cell disease without crisis: Secondary | ICD-10-CM

## 2020-01-22 DIAGNOSIS — G894 Chronic pain syndrome: Secondary | ICD-10-CM

## 2020-01-22 LAB — CBC WITH DIFFERENTIAL/PLATELET
Abs Immature Granulocytes: 0.07 10*3/uL (ref 0.00–0.07)
Basophils Absolute: 0 10*3/uL (ref 0.0–0.1)
Basophils Relative: 0 %
Eosinophils Absolute: 0.2 10*3/uL (ref 0.0–0.5)
Eosinophils Relative: 1 %
HCT: 30.2 % — ABNORMAL LOW (ref 39.0–52.0)
Hemoglobin: 10.2 g/dL — ABNORMAL LOW (ref 13.0–17.0)
Immature Granulocytes: 0 %
Lymphocytes Relative: 35 %
Lymphs Abs: 6 10*3/uL — ABNORMAL HIGH (ref 0.7–4.0)
MCH: 35.2 pg — ABNORMAL HIGH (ref 26.0–34.0)
MCHC: 33.8 g/dL (ref 30.0–36.0)
MCV: 104.1 fL — ABNORMAL HIGH (ref 80.0–100.0)
Monocytes Absolute: 1.7 10*3/uL — ABNORMAL HIGH (ref 0.1–1.0)
Monocytes Relative: 10 %
Neutro Abs: 9.4 10*3/uL — ABNORMAL HIGH (ref 1.7–7.7)
Neutrophils Relative %: 54 %
Platelets: 352 10*3/uL (ref 150–400)
RBC: 2.9 MIL/uL — ABNORMAL LOW (ref 4.22–5.81)
RDW: 25.3 % — ABNORMAL HIGH (ref 11.5–15.5)
WBC: 17.4 10*3/uL — ABNORMAL HIGH (ref 4.0–10.5)
nRBC: 0 % (ref 0.0–0.2)

## 2020-01-22 MED ORDER — DARBEPOETIN ALFA 200 MCG/0.4ML IJ SOSY
200.0000 ug | PREFILLED_SYRINGE | Freq: Once | INTRAMUSCULAR | Status: DC
  Filled 2020-01-22: qty 0.4

## 2020-01-22 MED ORDER — OXYCODONE HCL 10 MG PO TABS: 10.0000 mg | ORAL_TABLET | Freq: Four times a day (QID) | ORAL | 0 refills | Status: DC | PRN

## 2020-01-22 NOTE — Discharge Instructions (Signed)
Darbepoetin Alfa injection What is this medicine? DARBEPOETIN ALFA (dar be POE e tin AL fa) helps your body make more red blood cells. It is used to treat anemia caused by chronic kidney failure and chemotherapy. This medicine may be used for other purposes; ask your health care provider or pharmacist if you have questions. COMMON BRAND NAME(S): Aranesp What should I tell my health care provider before I take this medicine? They need to know if you have any of these conditions:  blood clotting disorders or history of blood clots  cancer patient not on chemotherapy  cystic fibrosis  heart disease, such as angina, heart failure, or a history of a heart attack  hemoglobin level of 12 g/dL or greater  high blood pressure  low levels of folate, iron, or vitamin B12  seizures  an unusual or allergic reaction to darbepoetin, erythropoietin, albumin, hamster proteins, latex, other medicines, foods, dyes, or preservatives  pregnant or trying to get pregnant  breast-feeding How should I use this medicine? This medicine is for injection into a vein or under the skin. It is usually given by a health care professional in a hospital or clinic setting. If you get this medicine at home, you will be taught how to prepare and give this medicine. Use exactly as directed. Take your medicine at regular intervals. Do not take your medicine more often than directed. It is important that you put your used needles and syringes in a special sharps container. Do not put them in a trash can. If you do not have a sharps container, call your pharmacist or healthcare provider to get one. A special MedGuide will be given to you by the pharmacist with each prescription and refill. Be sure to read this information carefully each time. Talk to your pediatrician regarding the use of this medicine in children. While this medicine may be used in children as young as 1 month of age for selected conditions, precautions do  apply. Overdosage: If you think you have taken too much of this medicine contact a poison control center or emergency room at once. NOTE: This medicine is only for you. Do not share this medicine with others. What if I miss a dose? If you miss a dose, take it as soon as you can. If it is almost time for your next dose, take only that dose. Do not take double or extra doses. What may interact with this medicine? Do not take this medicine with any of the following medications:  epoetin alfa This list may not describe all possible interactions. Give your health care provider a list of all the medicines, herbs, non-prescription drugs, or dietary supplements you use. Also tell them if you smoke, drink alcohol, or use illegal drugs. Some items may interact with your medicine. What should I watch for while using this medicine? Your condition will be monitored carefully while you are receiving this medicine. You may need blood work done while you are taking this medicine. This medicine may cause a decrease in vitamin B6. You should make sure that you get enough vitamin B6 while you are taking this medicine. Discuss the foods you eat and the vitamins you take with your health care professional. What side effects may I notice from receiving this medicine? Side effects that you should report to your doctor or health care professional as soon as possible:  allergic reactions like skin rash, itching or hives, swelling of the face, lips, or tongue  breathing problems  changes in   vision  chest pain  confusion, trouble speaking or understanding  feeling faint or lightheaded, falls  high blood pressure  muscle aches or pains  pain, swelling, warmth in the leg  rapid weight gain  severe headaches  sudden numbness or weakness of the face, arm or leg  trouble walking, dizziness, loss of balance or coordination  seizures (convulsions)  swelling of the ankles, feet, hands  unusually weak or  tired Side effects that usually do not require medical attention (report to your doctor or health care professional if they continue or are bothersome):  diarrhea  fever, chills (flu-like symptoms)  headaches  nausea, vomiting  redness, stinging, or swelling at site where injected This list may not describe all possible side effects. Call your doctor for medical advice about side effects. You may report side effects to FDA at 1-800-FDA-1088. Where should I keep my medicine? Keep out of the reach of children. Store in a refrigerator between 2 and 8 degrees C (36 and 46 degrees F). Do not freeze. Do not shake. Throw away any unused portion if using a single-dose vial. Throw away any unused medicine after the expiration date. NOTE: This sheet is a summary. It may not cover all possible information. If you have questions about this medicine, talk to your doctor, pharmacist, or health care provider.  2020 Elsevier/Gold Standard (2017-02-14 16:44:20)  

## 2020-01-22 NOTE — Progress Notes (Signed)
Reviewed PDMP substance reporting system prior to prescribing opiate medications. No inconsistencies noted.  Meds ordered this encounter  Medications   Oxycodone HCl 10 MG TABS    Sig: Take 1 tablet (10 mg total) by mouth every 6 (six) hours as needed.    Dispense:  60 tablet    Refill:  0    Order Specific Question:   Supervising Provider    Answer:   [name], [name] [[id number]]   [name] Plancarte Moore Sumie Remsen  APRN, MSN, FNP-C [facility] [address]  [place], [place] [address] [phone]  

## 2020-01-22 NOTE — Progress Notes (Signed)
Patient arrived to patient care center for labs and possible Aranesp injection. Patient's Hemoglobin wast 10.2. Patient to receive Aranesp for Hemoglobin < 8.  Injection not given today. Patient alert, oriented and ambulatory at discharge.

## 2020-01-25 IMAGING — DX LEFT FOOT - COMPLETE 3+ VIEW
3 series · 3 of 3 positions shown · non-contrast
Comparison: 12/03/2015

CLINICAL DATA: Left foot pain, denies injury

EXAM:
LEFT FOOT - COMPLETE 3+ VIEW

[foot ap]
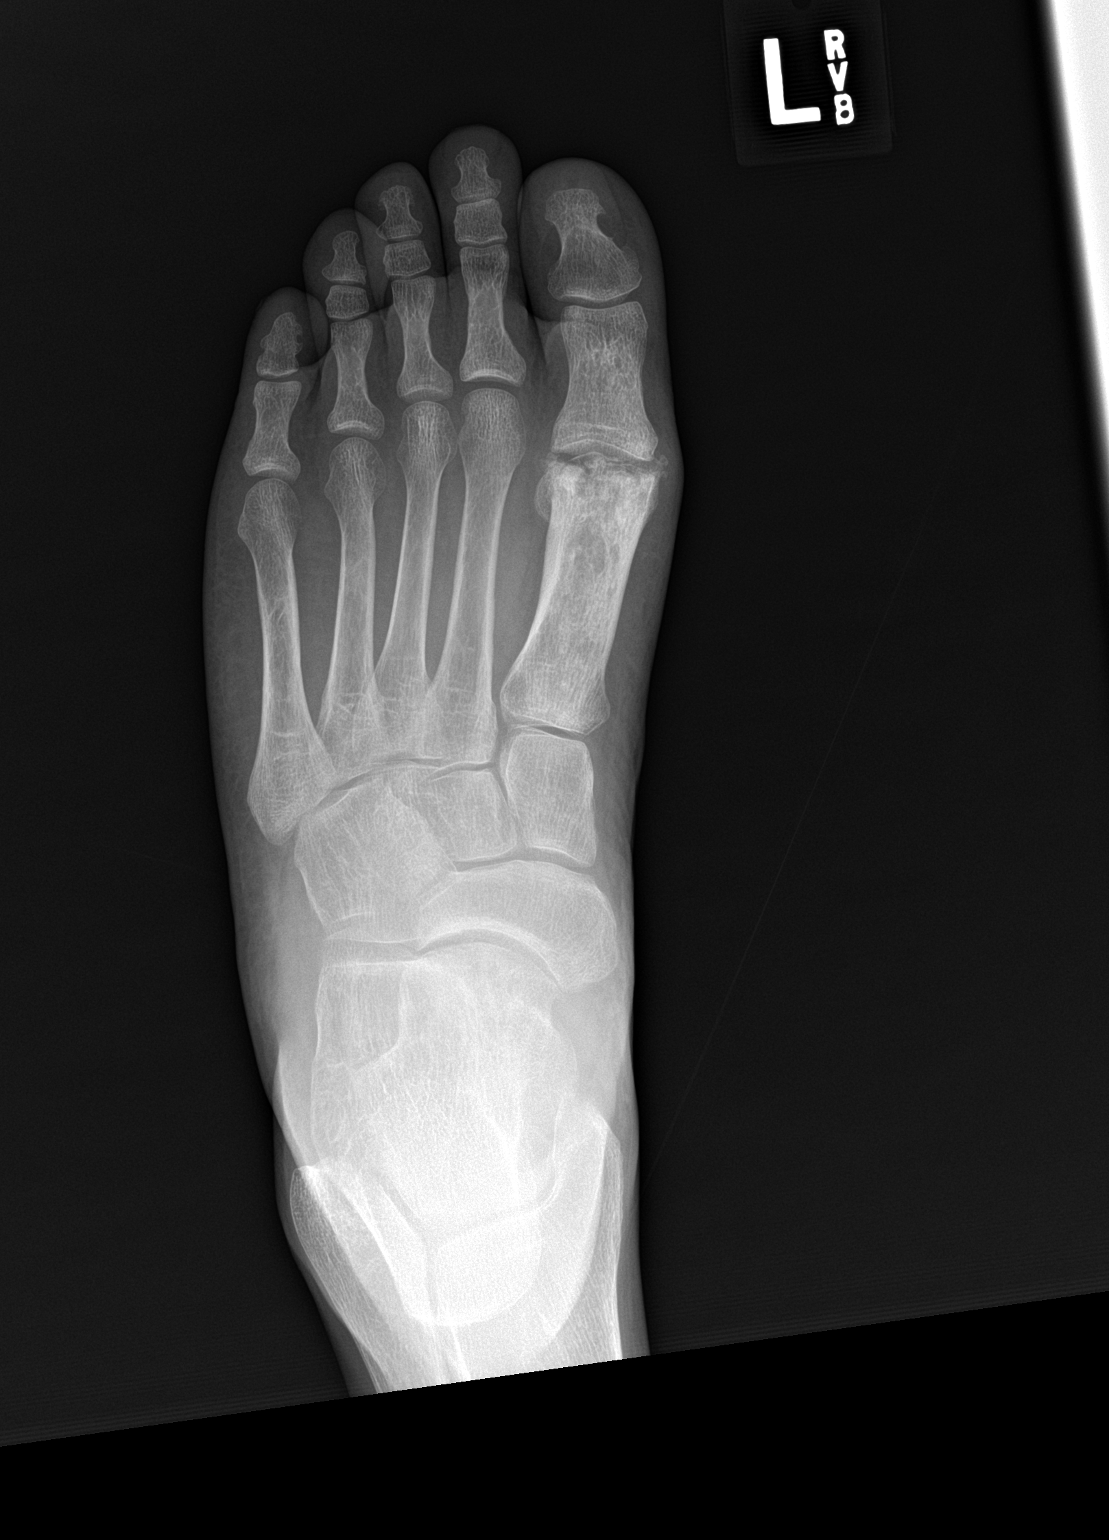

[foot obl]
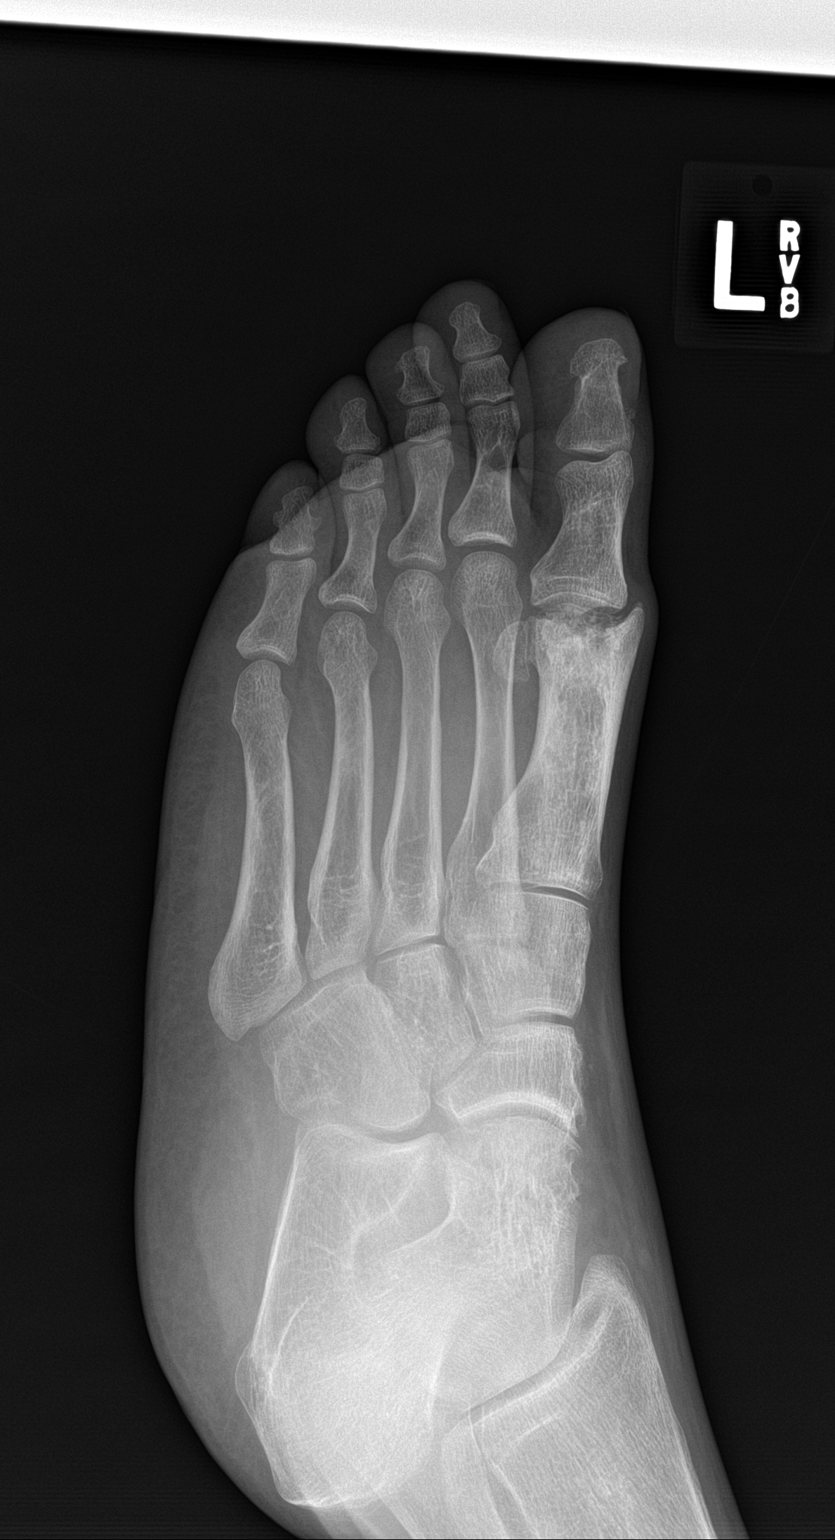

[foot lat]
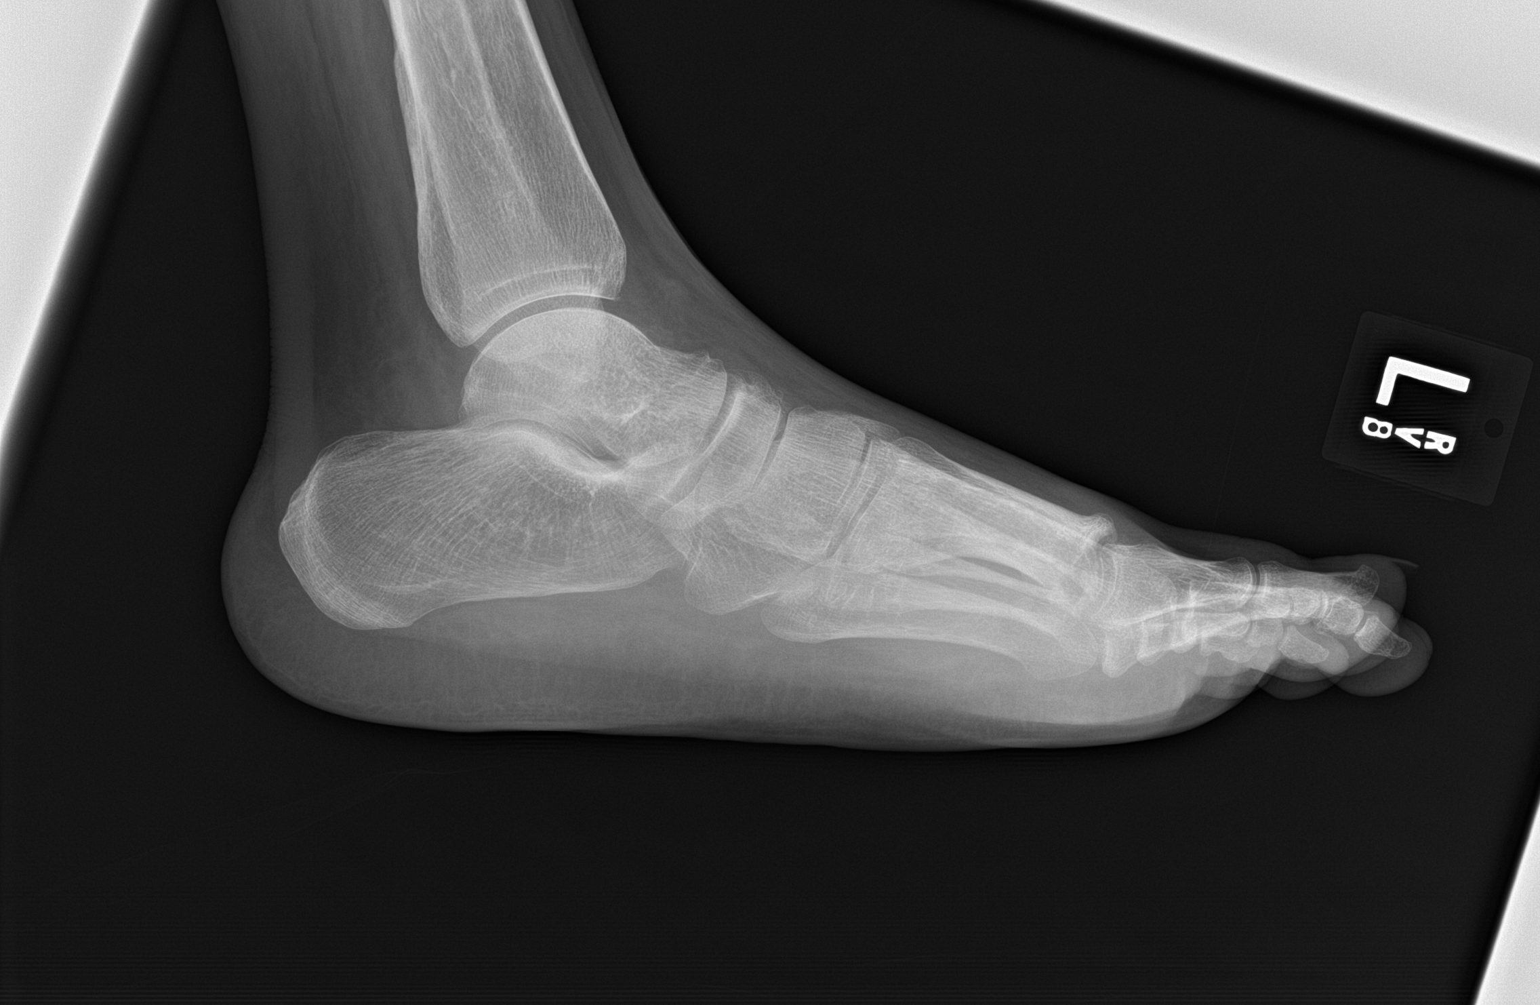

[3 of 3 positions shown; findings below may reference images not displayed]

FINDINGS: No acute fracture or dislocation of the left foot. There is
sclerosis and severe distal arthritic change of the left first
metatarsal at the metatarsophalangeal joint; the first proximal
phalanx is intact. These findings are substantially progressed in
comparison to radiographs dated 12/03/2015 and suggest
post-traumatic arthrosis or alternately advanced sequelae of
avascular necrosis. Joint spaces are otherwise well preserved.
IMPRESSION: No acute fracture or dislocation of the left foot. There is
sclerosis and severe distal arthritic change of the left first
metatarsal at the metatarsophalangeal joint; the first proximal
phalanx is intact. These findings are substantially progressed in
comparison to radiographs dated 12/03/2015 and suggest
post-traumatic arthrosis or alternately advanced sequelae of
avascular necrosis. Joint spaces are otherwise well preserved.

## 2020-01-26 LAB — CANNABINOID CONFIRMATION, UR
CANNABINOIDS: POSITIVE — AB
Carboxy THC GC/MS Conf: 406 ng/mL

## 2020-01-26 LAB — OXYCODONE/OXYMORPHONE, CONFIRM
OXYCODONE/OXYMORPH: POSITIVE — AB
OXYCODONE: 335 ng/mL
OXYCODONE: POSITIVE — AB
OXYMORPHONE (GC/MS): 1451 ng/mL
OXYMORPHONE: POSITIVE — AB

## 2020-01-26 LAB — DRUG SCREEN 764883 11+OXYCO+ALC+CRT-BUND
Amphetamines, Urine: NEGATIVE ng/mL
BENZODIAZ UR QL: NEGATIVE ng/mL
Barbiturate: NEGATIVE ng/mL
Cocaine (Metabolite): NEGATIVE ng/mL
Creatinine: 67.4 mg/dL (ref 20.0–300.0)
Ethanol: NEGATIVE %
Meperidine: NEGATIVE ng/mL
Methadone Screen, Urine: NEGATIVE ng/mL
OPIATE SCREEN URINE: NEGATIVE ng/mL
Phencyclidine: NEGATIVE ng/mL
Propoxyphene: NEGATIVE ng/mL
Tramadol: NEGATIVE ng/mL
pH, Urine: 6.4 (ref 4.5–8.9)

## 2020-01-26 NOTE — Telephone Encounter (Signed)
Done

## 2020-01-29 ENCOUNTER — Other Ambulatory Visit: Payer: Self-pay

## 2020-01-29 DIAGNOSIS — D571 Sickle-cell disease without crisis: Secondary | ICD-10-CM

## 2020-01-29 DIAGNOSIS — G894 Chronic pain syndrome: Secondary | ICD-10-CM

## 2020-01-30 ENCOUNTER — Telehealth: Payer: Self-pay | Admitting: Family Medicine

## 2020-01-30 ENCOUNTER — Other Ambulatory Visit: Payer: Self-pay | Admitting: Family Medicine

## 2020-01-30 DIAGNOSIS — G894 Chronic pain syndrome: Secondary | ICD-10-CM

## 2020-01-30 DIAGNOSIS — D571 Sickle-cell disease without crisis: Secondary | ICD-10-CM

## 2020-01-30 MED ORDER — OXYCODONE HCL ER 15 MG PO T12A: 15.0000 mg | EXTENDED_RELEASE_TABLET | Freq: Two times a day (BID) | ORAL | 0 refills | Status: AC

## 2020-01-30 NOTE — Telephone Encounter (Signed)
Pt is requesting a refill on this medication it this okay

## 2020-01-30 NOTE — Progress Notes (Signed)
Reviewed PDMP substance reporting system prior to prescribing opiate medications. No inconsistencies noted.  Meds ordered this encounter  Medications   oxyCODONE (OXYCONTIN) 15 mg 12 hr tablet    Sig: Take 1 tablet (15 mg total) by mouth every 12 (twelve) hours.    Dispense:  60 tablet    Refill:  0    Order Specific Question:   Supervising Provider    Answer:   [name], [name] [[id number]]   [name] Wrisley Moore [name]  APRN, MSN, FNP-C [facility] [address]  [place], [place] [address] [phone]  

## 2020-01-30 NOTE — Telephone Encounter (Signed)
.  h 

## 2020-02-02 NOTE — Telephone Encounter (Signed)
Sent the pharmacy

## 2020-02-03 ENCOUNTER — Other Ambulatory Visit: Payer: Self-pay | Admitting: Nurse Practitioner

## 2020-02-03 DIAGNOSIS — G894 Chronic pain syndrome: Secondary | ICD-10-CM

## 2020-02-03 DIAGNOSIS — Z8669 Personal history of other diseases of the nervous system and sense organs: Secondary | ICD-10-CM

## 2020-02-03 NOTE — Telephone Encounter (Signed)
Is this okay to refill? 

## 2020-02-20 ENCOUNTER — Telehealth: Payer: Self-pay | Admitting: Family Medicine

## 2020-02-20 ENCOUNTER — Other Ambulatory Visit (HOSPITAL_COMMUNITY): Payer: Self-pay | Admitting: Family Medicine

## 2020-02-20 DIAGNOSIS — G894 Chronic pain syndrome: Secondary | ICD-10-CM

## 2020-02-20 MED ORDER — OXYCODONE HCL 10 MG PO TABS: 10.0000 mg | ORAL_TABLET | Freq: Four times a day (QID) | ORAL | 0 refills | Status: DC | PRN

## 2020-02-20 NOTE — Progress Notes (Signed)
Reviewed PDMP substance reporting system prior to prescribing opiate medications. No inconsistencies noted.  Meds ordered this encounter  Medications   Oxycodone HCl 10 MG TABS    Sig: Take 1 tablet (10 mg total) by mouth every 6 (six) hours as needed.    Dispense:  60 tablet    Refill:  0    Order Specific Question:   Supervising Provider    Answer:   [name], [name] [[id number]]   [name] Taves Moore Kamelia Lampkins  APRN, MSN, FNP-C [facility] [address]  , [place] [address] [phone]  

## 2020-02-20 NOTE — Telephone Encounter (Signed)
error 

## 2020-02-23 NOTE — Telephone Encounter (Signed)
Done

## 2020-02-26 ENCOUNTER — Encounter (HOSPITAL_COMMUNITY): Payer: Self-pay | Admitting: Family Medicine

## 2020-02-26 ENCOUNTER — Other Ambulatory Visit: Payer: Self-pay

## 2020-02-26 ENCOUNTER — Telehealth (HOSPITAL_COMMUNITY): Payer: Self-pay | Admitting: General Practice

## 2020-02-26 ENCOUNTER — Inpatient Hospital Stay (HOSPITAL_COMMUNITY)
Admission: AD | Admit: 2020-02-26 | Discharge: 2020-02-28 | DRG: 812 | Disposition: A | Discharge status: Home / Self Care | Payer: 59 | Source: Ambulatory Visit | Attending: Internal Medicine | Admitting: Internal Medicine

## 2020-02-26 DIAGNOSIS — D5700 Hb-SS disease with crisis, unspecified: Secondary | ICD-10-CM | POA: Diagnosis not present

## 2020-02-26 DIAGNOSIS — Z20822 Contact with and (suspected) exposure to covid-19: Secondary | ICD-10-CM | POA: Diagnosis present

## 2020-02-26 DIAGNOSIS — F1120 Opioid dependence, uncomplicated: Secondary | ICD-10-CM | POA: Diagnosis present

## 2020-02-26 DIAGNOSIS — N179 Acute kidney failure, unspecified: Secondary | ICD-10-CM | POA: Diagnosis present

## 2020-02-26 DIAGNOSIS — Z803 Family history of malignant neoplasm of breast: Secondary | ICD-10-CM

## 2020-02-26 DIAGNOSIS — N184 Chronic kidney disease, stage 4 (severe): Secondary | ICD-10-CM | POA: Diagnosis present

## 2020-02-26 DIAGNOSIS — N189 Chronic kidney disease, unspecified: Secondary | ICD-10-CM | POA: Diagnosis present

## 2020-02-26 DIAGNOSIS — N08 Glomerular disorders in diseases classified elsewhere: Secondary | ICD-10-CM | POA: Diagnosis present

## 2020-02-26 DIAGNOSIS — F17210 Nicotine dependence, cigarettes, uncomplicated: Secondary | ICD-10-CM | POA: Diagnosis present

## 2020-02-26 DIAGNOSIS — D72829 Elevated white blood cell count, unspecified: Secondary | ICD-10-CM | POA: Diagnosis present

## 2020-02-26 DIAGNOSIS — Z885 Allergy status to narcotic agent status: Secondary | ICD-10-CM

## 2020-02-26 DIAGNOSIS — Z9049 Acquired absence of other specified parts of digestive tract: Secondary | ICD-10-CM

## 2020-02-26 DIAGNOSIS — Z79899 Other long term (current) drug therapy: Secondary | ICD-10-CM

## 2020-02-26 DIAGNOSIS — G894 Chronic pain syndrome: Secondary | ICD-10-CM | POA: Diagnosis present

## 2020-02-26 DIAGNOSIS — Z886 Allergy status to analgesic agent status: Secondary | ICD-10-CM

## 2020-02-26 LAB — CBC WITH DIFFERENTIAL/PLATELET
Abs Immature Granulocytes: 0.07 10*3/uL (ref 0.00–0.07)
Basophils Absolute: 0 10*3/uL (ref 0.0–0.1)
Basophils Relative: 0 %
Eosinophils Absolute: 0.3 10*3/uL (ref 0.0–0.5)
Eosinophils Relative: 2 %
HCT: 19.6 % — ABNORMAL LOW (ref 39.0–52.0)
Hemoglobin: 6.7 g/dL — CL (ref 13.0–17.0)
Immature Granulocytes: 1 %
Lymphocytes Relative: 39 %
Lymphs Abs: 5.5 10*3/uL — ABNORMAL HIGH (ref 0.7–4.0)
MCH: 36.4 pg — ABNORMAL HIGH (ref 26.0–34.0)
MCHC: 34.2 g/dL (ref 30.0–36.0)
MCV: 106.5 fL — ABNORMAL HIGH (ref 80.0–100.0)
Monocytes Absolute: 1.2 10*3/uL — ABNORMAL HIGH (ref 0.1–1.0)
Monocytes Relative: 9 %
Neutro Abs: 7 10*3/uL (ref 1.7–7.7)
Neutrophils Relative %: 49 %
Platelets: 258 10*3/uL (ref 150–400)
RBC: 1.84 MIL/uL — ABNORMAL LOW (ref 4.22–5.81)
WBC: 14.1 10*3/uL — ABNORMAL HIGH (ref 4.0–10.5)
nRBC: 0.2 % (ref 0.0–0.2)

## 2020-02-26 LAB — URINALYSIS, ROUTINE W REFLEX MICROSCOPIC
Bacteria, UA: NONE SEEN
Bilirubin Urine: NEGATIVE
Glucose, UA: 50 mg/dL — AB
Hgb urine dipstick: NEGATIVE
Ketones, ur: NEGATIVE mg/dL
Leukocytes,Ua: NEGATIVE
Nitrite: NEGATIVE
Protein, ur: 100 mg/dL — AB
Specific Gravity, Urine: 1.009 (ref 1.005–1.030)
pH: 8 (ref 5.0–8.0)

## 2020-02-26 LAB — COMPREHENSIVE METABOLIC PANEL
ALT: 30 U/L (ref 0–44)
AST: 40 U/L (ref 15–41)
Albumin: 3.4 g/dL — ABNORMAL LOW (ref 3.5–5.0)
Alkaline Phosphatase: 256 U/L — ABNORMAL HIGH (ref 38–126)
Anion gap: 13 (ref 5–15)
BUN: 43 mg/dL — ABNORMAL HIGH (ref 6–20)
CO2: 19 mmol/L — ABNORMAL LOW (ref 22–32)
Calcium: 8.3 mg/dL — ABNORMAL LOW (ref 8.9–10.3)
Chloride: 107 mmol/L (ref 98–111)
Creatinine, Ser: 3.07 mg/dL — ABNORMAL HIGH (ref 0.61–1.24)
GFR, Estimated: 26 mL/min — ABNORMAL LOW (ref >60)
Glucose, Bld: 95 mg/dL (ref 70–99)
Potassium: 5.1 mmol/L (ref 3.5–5.1)
Sodium: 139 mmol/L (ref 135–145)
Total Bilirubin: 2.1 mg/dL — ABNORMAL HIGH (ref 0.3–1.2)
Total Protein: 6.5 g/dL (ref 6.5–8.1)

## 2020-02-26 LAB — RETICULOCYTES
Immature Retic Fract: 32 % — ABNORMAL HIGH (ref 2.3–15.9)
RBC.: 1.87 MIL/uL — ABNORMAL LOW (ref 4.22–5.81)
Retic Count, Absolute: 192.8 10*3/uL — ABNORMAL HIGH (ref 19.0–186.0)
Retic Ct Pct: 10.3 % — ABNORMAL HIGH (ref 0.4–3.1)

## 2020-02-26 LAB — SARS CORONAVIRUS 2 (TAT 6-24 HRS): SARS Coronavirus 2: NEGATIVE

## 2020-02-26 MED ORDER — DARBEPOETIN ALFA 200 MCG/0.4ML IJ SOSY
200.0000 ug | PREFILLED_SYRINGE | Freq: Once | INTRAMUSCULAR | Status: AC
  Administered 2020-02-26: 200 ug via SUBCUTANEOUS
  Filled 2020-02-26: qty 0.4

## 2020-02-26 MED ORDER — NORTRIPTYLINE HCL 25 MG PO CAPS
25.0000 mg | ORAL_CAPSULE | Freq: Two times a day (BID) | ORAL | Status: DC
  Administered 2020-02-26 – 2020-02-28 (×4): 25 mg via ORAL
  Filled 2020-02-26 (×5): qty 1

## 2020-02-26 MED ORDER — OXYCODONE HCL ER 15 MG PO T12A
15.0000 mg | EXTENDED_RELEASE_TABLET | Freq: Two times a day (BID) | ORAL | Status: DC
  Administered 2020-02-26 – 2020-02-28 (×4): 15 mg via ORAL
  Filled 2020-02-26 (×4): qty 1

## 2020-02-26 MED ORDER — ACETAMINOPHEN 500 MG PO TABS
1000.0000 mg | ORAL_TABLET | Freq: Once | ORAL | Status: AC
  Administered 2020-02-26: 1000 mg via ORAL
  Filled 2020-02-26: qty 2

## 2020-02-26 MED ORDER — VITAMIN D 25 MCG (1000 UNIT) PO TABS
1000.0000 [IU] | ORAL_TABLET | Freq: Every day | ORAL | Status: DC
  Administered 2020-02-27 – 2020-02-28 (×2): 1000 [IU] via ORAL
  Filled 2020-02-26 (×2): qty 1

## 2020-02-26 MED ORDER — OXYCODONE HCL 5 MG PO TABS
10.0000 mg | ORAL_TABLET | Freq: Four times a day (QID) | ORAL | Status: DC | PRN
  Administered 2020-02-26 – 2020-02-27 (×2): 10 mg via ORAL
  Filled 2020-02-26 (×2): qty 2

## 2020-02-26 MED ORDER — ACETAMINOPHEN 500 MG PO TABS: 500.0000 mg | ORAL_TABLET | Freq: Four times a day (QID) | ORAL | Status: DC | PRN

## 2020-02-26 MED ORDER — SODIUM CHLORIDE 0.45 % IV SOLN: INTRAVENOUS | Status: DC

## 2020-02-26 MED ORDER — NALOXONE HCL 0.4 MG/ML IJ SOLN: 0.4000 mg | INTRAMUSCULAR | Status: DC | PRN

## 2020-02-26 MED ORDER — POLYETHYLENE GLYCOL 3350 17 G PO PACK: 17.0000 g | PACK | Freq: Every day | ORAL | Status: DC | PRN

## 2020-02-26 MED ORDER — SENNOSIDES-DOCUSATE SODIUM 8.6-50 MG PO TABS
1.0000 | ORAL_TABLET | Freq: Two times a day (BID) | ORAL | Status: DC
  Filled 2020-02-26 (×3): qty 1

## 2020-02-26 MED ORDER — SODIUM CHLORIDE 0.9% FLUSH: 9.0000 mL | INTRAVENOUS | Status: DC | PRN

## 2020-02-26 MED ORDER — SODIUM BICARBONATE 650 MG PO TABS
650.0000 mg | ORAL_TABLET | Freq: Two times a day (BID) | ORAL | Status: DC
  Administered 2020-02-26 – 2020-02-28 (×4): 650 mg via ORAL
  Filled 2020-02-26 (×4): qty 1

## 2020-02-26 MED ORDER — FOLIC ACID 1 MG PO TABS
1.0000 mg | ORAL_TABLET | Freq: Every day | ORAL | Status: DC
  Administered 2020-02-27 – 2020-02-28 (×2): 1 mg via ORAL
  Filled 2020-02-26 (×2): qty 1

## 2020-02-26 MED ORDER — ENOXAPARIN SODIUM 40 MG/0.4ML ~~LOC~~ SOLN
40.0000 mg | SUBCUTANEOUS | Status: DC
  Administered 2020-02-27: 40 mg via SUBCUTANEOUS
  Filled 2020-02-26 (×2): qty 0.4

## 2020-02-26 MED ORDER — HYDROMORPHONE 1 MG/ML IV SOLN
INTRAVENOUS | Status: DC
  Administered 2020-02-26: 5 mg via INTRAVENOUS
  Administered 2020-02-26 (×2): 1 mg via INTRAVENOUS
  Administered 2020-02-26: 30 mg via INTRAVENOUS
  Administered 2020-02-26: 6 mg via INTRAVENOUS
  Administered 2020-02-27: 30 mg via INTRAVENOUS
  Administered 2020-02-27: 2.2 mg via INTRAVENOUS
  Administered 2020-02-27: 0.5 mg via INTRAVENOUS
  Administered 2020-02-27: 4.5 mg via INTRAVENOUS
  Administered 2020-02-27 – 2020-02-28 (×3): 1 mg via INTRAVENOUS
  Administered 2020-02-28: 2 mg via INTRAVENOUS
  Filled 2020-02-26 (×2): qty 30

## 2020-02-26 NOTE — Telephone Encounter (Signed)
Patient called, requesting to come to the day hospital due to pain in the knees and swollen leg rated at 6/10. Denied chest pain, fever, diarrhea, abdominal pain, nausea/vomitting and priapism. Screened negative for Covid-19 symptoms. Admitted to having means of transportation without driving self after treatment. Last took 10 mg of oxycontin and tylenol at 7:00 am today. Per provider, patient can come to the day hospital for treatment. Patient notified, verbalized understanding.

## 2020-02-26 NOTE — Discharge Instructions (Signed)
Sickle Cell Anemia, Adult  Sickle cell anemia is a condition where your red blood cells are shaped like sickles. Red blood cells carry oxygen through the body. Sickle-shaped cells do not live as long as normal red blood cells. They also clump together and block blood from flowing through the blood vessels. This prevents the body from getting enough oxygen. Sickle cell anemia causes organ damage and pain. It also increases the risk of infection. Follow these instructions at home: Medicines  Take over-the-counter and prescription medicines only as told by your doctor.  If you were prescribed an antibiotic medicine, take it as told by your doctor. Do not stop taking the antibiotic even if you start to feel better.  If you develop a fever, do not take medicines to lower the fever right away. Tell your doctor about the fever. Managing pain, stiffness, and swelling  Try these methods to help with pain: ? Use a heating pad. ? Take a warm bath. ? Distract yourself, such as by watching TV. Eating and drinking  Drink enough fluid to keep your pee (urine) clear or pale yellow. Drink more in hot weather and during exercise.  Limit or avoid alcohol.  Eat a healthy diet. Eat plenty of fruits, vegetables, whole grains, and lean protein.  Take vitamins and supplements as told by your doctor. Traveling  When traveling, keep these with you: ? Your medical information. ? The names of your doctors. ? Your medicines.  If you need to take an airplane, talk to your doctor first. Activity  Rest often.  Avoid exercises that make your heart beat much faster, such as jogging. General instructions  Do not use products that have nicotine or tobacco, such as cigarettes and e-cigarettes. If you need help quitting, ask your doctor.  Consider wearing a medical alert bracelet.  Avoid being in high places (high altitudes), such as mountains.  Avoid very hot or cold temperatures.  Avoid places where the  temperature changes a lot.  Keep all follow-up visits as told by your doctor. This is important. Contact a doctor if:  A joint hurts.  Your feet or hands hurt or swell.  You feel tired (fatigued). Get help right away if:  You have symptoms of infection. These include: ? Fever. ? Chills. ? Being very tired. ? Irritability. ? Poor eating. ? Throwing up (vomiting).  You feel dizzy or faint.  You have new stomach pain, especially on the left side.  You have a an erection (priapism) that lasts more than 4 hours.  You have numbness in your arms or legs.  You have a hard time moving your arms or legs.  You have trouble talking.  You have pain that does not go away when you take medicine.  You are short of breath.  You are breathing fast.  You have a long-term cough.  You have pain in your chest.  You have a bad headache.  You have a stiff neck.  Your stomach looks bloated even though you did not eat much.  Your skin is pale.  You suddenly cannot see well. Summary  Sickle cell anemia is a condition where your red blood cells are shaped like sickles.  Follow your doctor's advice on ways to manage pain, food to eat, activities to do, and steps to take for safe travel.  Get medical help right away if you have any signs of infection, such as a fever. This information is not intended to replace advice given to you by   your health care provider. Make sure you discuss any questions you have with your health care provider. Document Revised: 06/26/2019 Document Reviewed: 06/26/2019 Elsevier Patient Education  2021 [employer]  

## 2020-02-26 NOTE — H&P (Signed)
[facility] History and Physical   Date: 02/26/2020  Patient name: [name] [name]. Medical record number: [id number] Date of birth: 1980-04-13 Age: 40 y.o. Gender: male PCP: [name], FNP  Attending physician: [name], MD  Chief Complaint: Sickle cell pain  History of Present Illness: [name] [name] is a 40 year old male with a medical history significant for sickle cell disease type SS, chronic kidney disease stage IV, history of symptomatic anemia, opiate dependence and tolerance, and history of polysubstance abuse presents complaining of bilateral knee pain that is consistent with his previous sickle cell pain crisis.  Patient states that he was in his usual state of health until 3 days prior when he awakened with increased pain to bilateral knees.  Patient also endorses some swelling at that time.  Pain intensity is 9/10 characterized as constant and throbbing.  Patient last had oxycodone, OxyContin, and Tylenol this a.m. without sustained relief.  He denies any previous injury or falls.  Patient denies current swelling or redness to lower extremities.  No subjective fever, chills, cough, chest pain, shortness of breath, urinary symptoms, nausea, vomiting, or diarrhea.  No recent travel, sick contacts, or known exposure to COVID-19 infection.  Of note, patient is vaccinated against COVID-19.  Meds: Medications Prior to Admission  Medication Sig Dispense Refill Last Dose  . acetaminophen (TYLENOL) 500 MG tablet Take 1 tablet (500 mg total) by mouth every 6 (six) hours as needed. (Patient taking differently: Take 500 mg by mouth every 6 (six) hours as needed for mild pain. ) 30 tablet 0   . Cholecalciferol (VITAMIN D3) 50 MCG (2000 UT) TABS Take 1 each by mouth daily. 30 tablet 11   . folic acid (FOLVITE) 1 MG tablet Take 1 tablet (1 mg total) by mouth daily. 30 tablet 12   . nortriptyline (PAMELOR) 25 MG capsule TAKE 1 CAPSULE BY MOUTH 2 TIMES  DAILY. 180 capsule 1   . oxyCODONE (OXYCONTIN) 15 mg 12 hr tablet Take 1 tablet (15 mg total) by mouth every 12 (twelve) hours. 60 tablet 0   . Oxycodone HCl 10 MG TABS Take 1 tablet (10 mg total) by mouth every 6 (six) hours as needed. 60 tablet 0   . sodium bicarbonate 650 MG tablet Take 650 mg by mouth 2 (two) times daily.       Allergies: Nsaids and Morphine and related Past Medical History:  Diagnosis Date  . Abscess of right iliac fossa 09/24/2018   required Perc Drain 09/24/2018  . Arachnoid Cyst of brain bilaterally    "2 really small ones in the back of my head; inside; saw them w/MRI" (09/25/2012)  . Bacterial pneumonia ~ 2012   "caught it here in the hospital" (09/25/2012)  . Chronic kidney disease    "from my sickle cell" (09/25/2012)  . CKD (chronic kidney disease), stage II   . Colitis 04/19/2017   CT scan abd/ pelvis  . GERD (gastroesophageal reflux disease)    "after I eat alot of spicey foods" (09/25/2012)  . Gynecomastia, male 07/10/2012  . History of blood transfusion    "always related to sickle cell crisis" (09/25/2012)  . 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   Noted in noted from [facility]ology [facility] at [facility]  . Migraines    "take RX qd to prevent them" (09/25/2012)  . Opioid dependence with withdrawal ([place]) 08/30/2012  . Renal insufficiency   . Sickle cell anemia (HCC)   . Sickle cell crisis ([place])  09/25/2012  . Sickle cell nephropathy ([place]) 07/10/2012  . Sinus tachycardia   . Tachycardia with heart rate 121-140 beats per minute with ambulation 08/04/2016   Past Surgical History:  Procedure Laterality Date  . ABCESS DRAINAGE    . CHOLECYSTECTOMY  ~ 2012  . COLONOSCOPY N/A 04/23/2017   Procedure: COLONOSCOPY;  Surgeon: Irene Shipper, MD;  Location: [facility];  Service: Endoscopy;  Laterality: N/A;  . IR FLUORO GUIDE CV LINE RIGHT  12/17/2016  . IR RADIOLOGIST EVAL & MGMT  10/02/2018  . IR RADIOLOGIST EVAL & MGMT  10/15/2018  . IR REMOVAL TUN CV  CATH W/O FL  12/21/2016  . IR US GUIDE VASC ACCESS RIGHT  12/17/2016  . spleenectomy     Family History  Problem Relation Age of Onset  . Breast cancer Mother    Social History   Socioeconomic History  . Marital status: Single    Spouse name: Not on file  . Number of children: Not on file  . Years of education: Not on file  . Highest education level: Not on file  Occupational History  . Not on file  Tobacco Use  . Smoking status: Current Some Day Smoker    Packs/day: 0.10    Types: Cigarettes  . Smokeless tobacco: Never Used  . Tobacco comment: 09/25/2012 "I don't buy cigarettes; bum one from friends q now and then"  Vaping Use  . Vaping Use: Never used  Substance and Sexual Activity  . Alcohol use: Yes    Alcohol/week: 0.0 standard drinks    Comment: Once a month  . Drug use: Yes    Types: Marijuana    Comment: Every 2-3 weeks   . Sexual activity: Yes  Other Topics Concern  . Not on file  Social History Narrative    Lives alone in [place].   Back at school, studying Public relations account executive. Unemployed.    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    Social Determinants of Health   Financial Resource Strain: Not on file  Food Insecurity: Not on file  Transportation Needs: Not on file  Physical Activity: Not on file  Stress: Not on file  Social Connections: Not on file  Intimate Partner Violence: Not on file   Review of Systems  Constitutional: Negative for chills and fever.  HENT: Negative.   Eyes: Negative.   Respiratory: Negative.   Cardiovascular: Negative.   Gastrointestinal: Negative.   Genitourinary: Negative.   Musculoskeletal: Positive for back pain and joint pain.  Neurological: Negative.   Psychiatric/Behavioral: Negative.     Physical Exam: Blood pressure 135/75, pulse (!) 101, temperature 98.8 F (37.1  C), temperature source Temporal, resp. rate 16, SpO2 98 %.  Physical Exam Constitutional:      Appearance: Normal appearance.  HENT:     Nose: Nose normal.  Eyes:     Pupils: Pupils are equal, round, and reactive to light.  Cardiovascular:     Rate and Rhythm: Normal rate and regular rhythm.     Pulses: Normal pulses.     Heart sounds: Murmur heard.    Pulmonary:     Effort: Pulmonary effort is normal.  Abdominal:     General: Abdomen is flat. Bowel sounds are normal.  Musculoskeletal:        General: Normal range of motion.  Skin:  General: Skin is warm.  Neurological:     General: No focal deficit present.     Mental Status: He is alert. Mental status is at baseline.  Psychiatric:        Mood and Affect: Mood normal.        Thought Content: Thought content normal.        Judgment: Judgment normal.     Lab results: No results found for this or any previous visit (from the past 24 hour(s)).  Imaging results:  No results found.   Assessment & Plan: Patient admitted to sickle cell day infusion center for management of pain crisis.  Patient is opiate tolerant Initiate IV dilaudid PCA. Settings of 0.5 mg, 10 minute lockout, and 3 mg/hr IV fluids, 0.45% saline at 100 ml/hr Tylenol 1000 mg by mouth times one dose Review CBC with differential, complete metabolic panel, and reticulocytes as results become available. Baseline hemoglobin is 6-7 g/dL Pain intensity will be reevaluated in context of functioning and relationship to baseline as care progresses If pain intensity remains elevated and/or sudden change in hemodynamic stability transition to inpatient services for higher level of care.   [name]  APRN, MSN, FNP-C [facility] [address] [place], [place] [address] [phone]    02/26/2020, 9:54 AM

## 2020-02-26 NOTE — Progress Notes (Addendum)
Pt admitted to the day hospital for treatment of sickle cell pain crisis. Pt reported pain to knees  rated a 7/10. Pt given PO Tylenol,placed on Dilaudid PCA, and hydrated with IV fluids. Critical lab value of Hgb 6.7 report by Lupita Raider. [place] FNP notified. Order given to administer Aranesp.  Pain not controlled on PCA, pt to be admitted to [place] [facility]. Report called to [facility]. Pt rates a pain 7/10 at hospital transfer. Taken by [facility] staff in wheelchair to hospital room. Pt stable, alert and oriented at discharge.

## 2020-02-26 NOTE — H&P (Signed)
H&P  Patient Demographics:  [name] [name], is a 40 y.o. male  MRN: [id number]   DOB - [birth year]  Admit Date - 02/26/2020  Outpatient Primary MD for the patient is [name], FNP      HPI:   [name] [name] is a 40 year old male with a medical history significant for sickle cell disease type SS, chronic kidney disease stage IV, history of symptomatic anemia, opiate dependence and tolerance, and history of polysubstance abuse presents complaining of bilateral knee pain that is consistent with his previous sickle cell pain crisis.  Patient states that he was in his usual state of health until 3 days prior when he awakened with increased pain to bilateral knees.  Patient also endorses some swelling at that time.  Pain intensity is 9/10 characterized as constant and throbbing.  Patient last had oxycodone, OxyContin, and Tylenol this a.m. without sustained relief.  He denies any previous injury or falls.  Patient denies current swelling or redness to lower extremities.  No subjective fever, chills, cough, chest pain, shortness of breath, urinary symptoms, nausea, vomiting, or diarrhea.  No recent travel, sick contacts, or known exposure to COVID-19 infection.  Of note, patient is vaccinated against COVID-19  Sickle cell day clinic course:  Vital signs show: BP 122/78 (BP Location: Left Arm)   Pulse 100   Temp 98.2 F (36.8 C) (Oral)   Resp 15   SpO2 99%  Complete blood count shows hemoglobin 6.7, WBCs 14.4, and platelets 258. Complete metabolic panel shows creatinine elevated at 3.07.  Above patient's baseline.  COVID-19 test pending.  Pain persists despite IV Dilaudid PCA, IV fluids, and Tylenol.  Patient admitted to [place] in observation for management of sickle cell pain crisis.   Review of systems:  Review of Systems  Constitutional: Negative for chills and fever.  HENT: Negative.   Eyes: Negative.   Respiratory: Negative.   Cardiovascular: Negative.  Negative for chest  pain.  Gastrointestinal: Negative.   Genitourinary: Negative.   Musculoskeletal: Positive for back pain and joint pain.  Skin: Negative.   Neurological: Negative.   Psychiatric/Behavioral: Negative.      With Past History of the following :   Past Medical History:  Diagnosis Date  . Abscess of right iliac fossa 09/24/2018   required Perc Drain 09/24/2018  . Arachnoid Cyst of brain bilaterally    "2 really small ones in the back of my head; inside; saw them w/MRI" (09/25/2012)  . Bacterial pneumonia ~ 2012   "caught it here in the hospital" (09/25/2012)  . Chronic kidney disease    "from my sickle cell" (09/25/2012)  . CKD (chronic kidney disease), stage II   . Colitis 04/19/2017   CT scan abd/ pelvis  . GERD (gastroesophageal reflux disease)    "after I eat alot of spicey foods" (09/25/2012)  . Gynecomastia, male 07/10/2012  . History of blood transfusion    "always related to sickle cell crisis" (09/25/2012)  . 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   Noted in noted from Hematology notes at [facility]  . Migraines    "take RX qd to prevent them" (09/25/2012)  . Opioid dependence with withdrawal ([place]) 08/30/2012  . Renal insufficiency   . Sickle cell anemia (HCC)   . Sickle cell crisis ([place]) 09/25/2012  . Sickle cell nephropathy ([place]) 07/10/2012  . Sinus tachycardia   . Tachycardia with heart rate 121-140 beats per minute with ambulation 08/04/2016      Past Surgical History:  Procedure  Laterality Date  . ABCESS DRAINAGE    . CHOLECYSTECTOMY  ~ 2012  . COLONOSCOPY N/A 04/23/2017   Procedure: COLONOSCOPY;  Surgeon: Irene Shipper, MD;  Location: [facility];  Service: Endoscopy;  Laterality: N/A;  . IR FLUORO GUIDE CV LINE RIGHT  12/17/2016  . IR RADIOLOGIST EVAL & MGMT  10/02/2018  . IR RADIOLOGIST EVAL & MGMT  10/15/2018  . IR REMOVAL TUN CV CATH W/O FL  12/21/2016  . IR US GUIDE VASC ACCESS RIGHT  12/17/2016  . spleenectomy       Social History:   Social History    Tobacco Use  . Smoking status: Current Some Day Smoker    Packs/day: 0.10    Types: Cigarettes  . Smokeless tobacco: Never Used  . Tobacco comment: 09/25/2012 "I don't buy cigarettes; bum one from friends q now and then"  Substance Use Topics  . Alcohol use: Yes    Alcohol/week: 0.0 standard drinks    Comment: Once a month     Lives - At home   Family History :   Family History  Problem Relation Age of Onset  . Breast cancer Mother      Home Medications:   Prior to Admission medications   Medication Sig Start Date End Date Taking? Authorizing Provider  acetaminophen (TYLENOL) 500 MG tablet Take 1 tablet (500 mg total) by mouth every 6 (six) hours as needed. Patient taking differently: Take 500 mg by mouth every 6 (six) hours as needed for mild pain. 03/26/19  Yes [name], FNP  allopurinol (ZYLOPRIM) 100 MG tablet Take 100 mg by mouth daily. 02/12/20  Yes [provider]  Cholecalciferol (VITAMIN D3) 50 MCG (2000 UT) TABS Take 1 each by mouth daily. Patient taking differently: Take 2,000 Units by mouth daily. 01/13/20  Yes [name], FNP  folic acid (FOLVITE) 1 MG tablet Take 1 tablet (1 mg total) by mouth daily. 03/11/19  Yes [name], FNP  nortriptyline (PAMELOR) 25 MG capsule TAKE 1 CAPSULE BY MOUTH 2 TIMES DAILY. Patient taking differently: Take 25 mg by mouth 2 (two) times daily. 02/03/20  Yes [name], FNP  oxyCODONE (OXYCONTIN) 15 mg 12 hr tablet Take 1 tablet (15 mg total) by mouth every 12 (twelve) hours. 01/31/20 03/01/20 Yes [name], FNP  Oxycodone HCl 10 MG TABS Take 1 tablet (10 mg total) by mouth every 6 (six) hours as needed. Patient taking differently: Take 10 mg by mouth every 6 (six) hours as needed (pain). 02/20/20  Yes [name], FNP  sodium bicarbonate 650 MG tablet Take 650 mg by mouth 2 (two) times daily. 11/07/18  Yes [provider]     Allergies:   Allergies  Allergen Reactions  .  Nsaids Other (See Comments)    Kidney disease  . Morphine And Related Other (See Comments)    "Real bad headaches"      Physical Exam:   Vitals:   Vitals:   02/26/20 1311 02/26/20 1355  BP: 131/77 122/78  Pulse: 83 100  Resp: 16 15  Temp: 98.3 F (36.8 C) 98.2 F (36.8 C)  SpO2: 98% 99%    Physical Exam: Constitutional: Patient appears well-developed and well-nourished. Not in obvious distress. HENT: Normocephalic, atraumatic, External right and left ear normal. Oropharynx is clear and moist.  Eyes: Conjunctivae and EOM are normal. PERRLA, no scleral icterus. Neck: Normal ROM. Neck supple. No JVD. No tracheal deviation. No thyromegaly. CVS: RRR, S1/S2 +,  no murmurs, no gallops, no carotid bruit.  Pulmonary: Effort and breath sounds normal, no stridor, rhonchi, wheezes, rales.  Abdominal: Soft. BS +, no distension, tenderness, rebound or guarding.  Musculoskeletal: Normal range of motion. No edema and no tenderness.  Lymphadenopathy: No lymphadenopathy noted, cervical, inguinal or axillary Neuro: Alert. Normal reflexes, muscle tone coordination. No cranial nerve deficit. Skin: Skin is warm and dry. No rash noted. Not diaphoretic. No erythema. No pallor. Psychiatric: Normal mood and affect. Behavior, judgment, thought content normal.   Data Review:   CBC Recent Labs  Lab 02/26/20 1053  WBC 14.1*  HGB 6.7*  HCT 19.6*  PLT 258  MCV 106.5*  MCH 36.4*  MCHC 34.2  RDW Not Measured  LYMPHSABS 5.5*  MONOABS 1.2*  EOSABS 0.3  BASOSABS 0.0   ------------------------------------------------------------------------------------------------------------------  Chemistries  Recent Labs  Lab 02/26/20 1053  NA 139  K 5.1  CL 107  CO2 19*  GLUCOSE 95  BUN 43*  CREATININE 3.07*  CALCIUM 8.3*  AST 40  ALT 30  ALKPHOS 256*  BILITOT 2.1*   ------------------------------------------------------------------------------------------------------------------ CrCl cannot be  calculated (Unknown ideal weight.). ------------------------------------------------------------------------------------------------------------------ No results for input(s): TSH, T4TOTAL, T3FREE, THYROIDAB in the last 72 hours.  Invalid input(s): FREET3  Coagulation profile No results for input(s): INR, PROTIME in the last 168 hours. ------------------------------------------------------------------------------------------------------------------- No results for input(s): DDIMER in the last 72 hours. -------------------------------------------------------------------------------------------------------------------  Cardiac Enzymes No results for input(s): CKMB, TROPONINI, MYOGLOBIN in the last 168 hours.  Invalid input(s): CK ------------------------------------------------------------------------------------------------------------------ No results found for: BNP  ---------------------------------------------------------------------------------------------------------------  Urinalysis    Component Value Date/Time   COLORURINE YELLOW 01/06/2020 1030   APPEARANCEUR CLEAR 01/06/2020 1030   LABSPEC 1.010 01/06/2020 1030   PHURINE 8.0 01/06/2020 1030   GLUCOSEU 50 (A) 01/06/2020 1030   HGBUR NEGATIVE 01/06/2020 1030   HGBUR small 03/10/2010 1445   BILIRUBINUR negative 01/13/2020 1200   BILIRUBINUR negative 10/14/2019 1030   KETONESUR negative 01/13/2020 1200   KETONESUR NEGATIVE 01/06/2020 1030   PROTEINUR 100 (A) 01/06/2020 1030   UROBILINOGEN 0.2 01/13/2020 1200   UROBILINOGEN 0.2 05/11/2017 1335   NITRITE Negative 01/13/2020 1200   NITRITE NEGATIVE 01/06/2020 1030   LEUKOCYTESUR Negative 01/13/2020 1200   LEUKOCYTESUR NEGATIVE 01/06/2020 1030    ----------------------------------------------------------------------------------------------------------------   Imaging Results:    No results found.   Assessment & Plan:  Principal Problem:   Sickle cell pain crisis  ([place]) Active Problems:   CKD (chronic kidney disease), stage IV (HCC)   Acute kidney injury superimposed on chronic kidney disease (HCC)   Chronic pain syndrome  Sickle cell disease with pain crisis: Admit to [facility] for further management of sickle cell pain crisis.  Continue IV Dilaudid PCA, no changes in settings at this time. Continue IV fluids, 0.45% saline at 100 mL/h Hold IV Toradol due to chronic kidney disease stage IV Initiate home medications oxycodone 10 mg every 6 hours as needed for severe breakthrough pain and OxyContin 15 mg every 12 hours. Monitor vital signs very closely, reevaluate pain scale regularly, and supplemental oxygen as needed.  Leukocytosis: WBCs elevated at 14.4.  More than likely secondary to sickle cell crisis.  Continue to follow closely without antibiotics.  Repeat CBC in a.m.  Chronic pain syndrome: Continue home medications  Sickle cell anemia: Hemoglobin 6.7, consistent with patient's baseline.  Patient was due for Aranesp injection, received 200 mcg on today without complication.  No blood transfusion warranted at this time.  Patient typically not transfused unless hemoglobin is less  than 6.0 g/dL.  Transfuse conservatively due to history of alloantibodies.  Repeat CBC in a.m.  Acute kidney injury superimposed on chronic kidney disease stage IV: Today, creatinine is elevated at 3.07, which is above patient's baseline.  Baseline is 2.5- 3.  Gentle hydration.  Avoid all nephrotoxic medications.  Follow labs closely.   DVT Prophylaxis: Subcut Lovenox and SCDs  AM Labs Ordered, also please review Full Orders  Family Communication: Admission, patient's condition and plan of care including tests being ordered have been discussed with the patient who indicate understanding and agree with the plan and Code Status.  Code Status: Full Code  Consults called: None    Admission status: Inpatient    Time spent in minutes : 35 minutes  [place], MSN, FNP-C [facility] [address] [place], [place] [address] [phone]  02/26/2020 at 3:30 PM

## 2020-02-27 ENCOUNTER — Encounter (HOSPITAL_COMMUNITY): Payer: Self-pay | Admitting: Family Medicine

## 2020-02-27 DIAGNOSIS — D72829 Elevated white blood cell count, unspecified: Secondary | ICD-10-CM | POA: Diagnosis present

## 2020-02-27 DIAGNOSIS — Z79899 Other long term (current) drug therapy: Secondary | ICD-10-CM | POA: Diagnosis not present

## 2020-02-27 DIAGNOSIS — N189 Chronic kidney disease, unspecified: Secondary | ICD-10-CM

## 2020-02-27 DIAGNOSIS — D5700 Hb-SS disease with crisis, unspecified: Secondary | ICD-10-CM | POA: Diagnosis present

## 2020-02-27 DIAGNOSIS — N179 Acute kidney failure, unspecified: Secondary | ICD-10-CM

## 2020-02-27 DIAGNOSIS — Z9049 Acquired absence of other specified parts of digestive tract: Secondary | ICD-10-CM | POA: Diagnosis not present

## 2020-02-27 DIAGNOSIS — N08 Glomerular disorders in diseases classified elsewhere: Secondary | ICD-10-CM | POA: Diagnosis present

## 2020-02-27 DIAGNOSIS — F17210 Nicotine dependence, cigarettes, uncomplicated: Secondary | ICD-10-CM | POA: Diagnosis present

## 2020-02-27 DIAGNOSIS — Z20822 Contact with and (suspected) exposure to covid-19: Secondary | ICD-10-CM | POA: Diagnosis present

## 2020-02-27 DIAGNOSIS — Z803 Family history of malignant neoplasm of breast: Secondary | ICD-10-CM | POA: Diagnosis not present

## 2020-02-27 DIAGNOSIS — F1120 Opioid dependence, uncomplicated: Secondary | ICD-10-CM | POA: Diagnosis present

## 2020-02-27 DIAGNOSIS — G894 Chronic pain syndrome: Secondary | ICD-10-CM | POA: Diagnosis present

## 2020-02-27 DIAGNOSIS — Z886 Allergy status to analgesic agent status: Secondary | ICD-10-CM | POA: Diagnosis not present

## 2020-02-27 DIAGNOSIS — N184 Chronic kidney disease, stage 4 (severe): Secondary | ICD-10-CM

## 2020-02-27 DIAGNOSIS — Z885 Allergy status to narcotic agent status: Secondary | ICD-10-CM | POA: Diagnosis not present

## 2020-02-27 LAB — CBC
HCT: 17.8 % — ABNORMAL LOW (ref 39.0–52.0)
Hemoglobin: 6 g/dL — CL (ref 13.0–17.0)
MCH: 35.3 pg — ABNORMAL HIGH (ref 26.0–34.0)
MCHC: 33.7 g/dL (ref 30.0–36.0)
MCV: 104.7 fL — ABNORMAL HIGH (ref 80.0–100.0)
Platelets: 239 10*3/uL (ref 150–400)
RBC: 1.7 MIL/uL — ABNORMAL LOW (ref 4.22–5.81)
WBC: 17.9 10*3/uL — ABNORMAL HIGH (ref 4.0–10.5)
nRBC: 0.3 % — ABNORMAL HIGH (ref 0.0–0.2)

## 2020-02-27 LAB — BASIC METABOLIC PANEL
Anion gap: 8 (ref 5–15)
BUN: 35 mg/dL — ABNORMAL HIGH (ref 6–20)
CO2: 21 mmol/L — ABNORMAL LOW (ref 22–32)
Calcium: 8.7 mg/dL — ABNORMAL LOW (ref 8.9–10.3)
Chloride: 109 mmol/L (ref 98–111)
Creatinine, Ser: 2.82 mg/dL — ABNORMAL HIGH (ref 0.61–1.24)
GFR, Estimated: 29 mL/min — ABNORMAL LOW (ref >60)
Glucose, Bld: 101 mg/dL — ABNORMAL HIGH (ref 70–99)
Potassium: 4.8 mmol/L (ref 3.5–5.1)
Sodium: 138 mmol/L (ref 135–145)

## 2020-02-27 MED ORDER — SODIUM CHLORIDE 0.45 % IV SOLN
INTRAVENOUS | Status: DC
  Administered 2020-02-27: 50 mL/h via INTRAVENOUS

## 2020-02-27 MED ORDER — SODIUM CHLORIDE 0.9% FLUSH: 10.0000 mL | INTRAVENOUS | Status: DC | PRN

## 2020-02-27 MED ORDER — HYDROMORPHONE HCL 1 MG/ML IJ SOLN
0.5000 mg | Freq: Once | INTRAMUSCULAR | Status: AC
Start: 2020-02-27 — End: 2020-02-27
  Administered 2020-02-27: 0.5 mg via INTRAVENOUS
  Filled 2020-02-27: qty 0.5

## 2020-02-27 MED ORDER — SODIUM CHLORIDE 0.9% FLUSH
10.0000 mL | Freq: Two times a day (BID) | INTRAVENOUS | Status: DC
  Administered 2020-02-27 – 2020-02-28 (×4): 10 mL

## 2020-02-27 NOTE — Progress Notes (Signed)
Subjective: [name] [name] is a 40 year old male with a medical history significant for sickle cell disease type SS, chronic kidney disease stage IV, history of symptomatic anemia, opiate dependence and tolerance, and history of polysubstance abuse was admitted for sickle cell pain crisis. Patient states that pain intensity has worsened overnight.  Pain is primarily to low back and lower extremities.  Pain persists despite IV Dilaudid PCA. He denies any headache, chest pain, shortness of breath, urinary symptoms, nausea, vomiting, or diarrhea.  Objective:  Vital signs in last 24 hours:  Vitals:   02/27/20 0738 02/27/20 1009 02/27/20 1158 02/27/20 1206  BP:  111/73    Pulse:  96    Resp: 18 15  16   Temp:  98.3 F (36.8 C)    TempSrc:  Oral    SpO2: 98% 94%  96%  Weight:   57.4 kg   Height:   5\' 3"  (1.6 m)     Intake/Output from previous day:   Intake/Output Summary (Last 24 hours) at 02/27/2020 1258 Last data filed at 02/27/2020 1157 Gross per 24 hour  Intake 1346 ml  Output 2700 ml  Net -1354 ml    Physical Exam: General: Alert, awake, oriented x3, in no acute distress.  HEENT: [place]/AT PEERL, EOMI Neck: Trachea midline,  no masses, no thyromegal,y no JVD, no carotid bruit OROPHARYNX:  Moist, No exudate/ erythema/lesions.  Heart: Regular rate and rhythm, without murmurs, rubs, gallops, PMI non-displaced, no heaves or thrills on palpation.  Lungs: Clear to auscultation, no wheezing or rhonchi noted. No increased vocal fremitus resonant to percussion  Abdomen: Soft, nontender, nondistended, positive bowel sounds, no masses no hepatosplenomegaly noted..  Neuro: No focal neurological deficits noted cranial nerves II through XII grossly intact. DTRs 2+ bilaterally upper and lower extremities. Strength 5 out of 5 in bilateral upper and lower extremities. Musculoskeletal: No warm swelling or erythema around joints, no spinal tenderness noted. Psychiatric: Patient alert and oriented  x3, good insight and cognition, good recent to remote recall. Lymph node survey: No cervical axillary or inguinal lymphadenopathy noted.  Lab Results:  Basic Metabolic Panel:    Component Value Date/Time   NA 138 02/27/2020 0623   NA 136 10/14/2019 1044   K 4.8 02/27/2020 0623   CL 109 02/27/2020 0623   CO2 21 (L) 02/27/2020 0623   BUN 35 (H) 02/27/2020 0623   BUN 38 (H) 10/14/2019 1044   CREATININE 2.82 (H) 02/27/2020 0623   CREATININE 1.45 (H) 05/12/2014 1430   GLUCOSE 101 (H) 02/27/2020 0623   CALCIUM 8.7 (L) 02/27/2020 0623   CBC:    Component Value Date/Time   WBC 17.9 (H) 02/27/2020 0623   HGB 6.0 (LL) 02/27/2020 0623   HGB 6.7 (LL) 10/14/2019 1044   HCT 17.8 (L) 02/27/2020 0623   HCT 20.1 (L) 10/14/2019 1044   PLT 239 02/27/2020 0623   PLT 317 10/14/2019 1044   MCV 104.7 (H) 02/27/2020 0623   MCV 91 10/14/2019 1044   NEUTROABS 7.0 02/26/2020 1053   NEUTROABS 7.1 (H) 10/14/2019 1044   LYMPHSABS 5.5 (H) 02/26/2020 1053   LYMPHSABS 6.9 (H) 10/14/2019 1044   MONOABS 1.2 (H) 02/26/2020 1053   EOSABS 0.3 02/26/2020 1053   EOSABS 0.4 10/14/2019 1044   BASOSABS 0.0 02/26/2020 1053   BASOSABS 0.0 10/14/2019 1044    Recent Results (from the past 240 hour(s))  SARS CORONAVIRUS 2 (TAT 6-24 HRS) Nasopharyngeal Nasopharyngeal Swab     Status: None   Collection Time: 02/26/20  2:31 PM   Specimen: Nasopharyngeal Swab  Result Value Ref Range Status   SARS Coronavirus 2 NEGATIVE NEGATIVE Final    Comment: (NOTE) SARS-CoV-2 target nucleic acids are NOT DETECTED.  The SARS-CoV-2 RNA is generally detectable in upper and lower respiratory specimens during the acute phase of infection. Negative results do not preclude SARS-CoV-2 infection, do not rule out co-infections with other pathogens, and should not be used as the sole basis for treatment or other patient management decisions. Negative results must be combined with clinical observations, patient history, and  epidemiological information. The expected result is Negative.  Fact Sheet for Patients: SugarRoll.be  Fact Sheet for Healthcare Providers: https://www.woods-mathews.com/  This test is not yet approved or cleared by the [place] FDA and  has been authorized for detection and/or diagnosis of SARS-CoV-2 by FDA under an Emergency Use Authorization (EUA). This EUA will remain  in effect (meaning this test can be used) for the duration of the COVID-19 declaration under Se ction 564(b)(1) of the Act, 21 U.S.C. section 360bbb-3(b)(1), unless the authorization is terminated or revoked sooner.  Performed at [facility], [place] [address] [place], [place] [address]     Studies/Results: No results found.  Medications: Scheduled Meds: . cholecalciferol  1,000 Units Oral Daily  . enoxaparin (LOVENOX) injection  40 mg Subcutaneous Q24H  . folic acid  1 mg Oral Daily  . HYDROmorphone   Intravenous Q4H  . nortriptyline  25 mg Oral BID  . oxyCODONE  15 mg Oral Q12H  . senna-docusate  1 tablet Oral BID  . sodium bicarbonate  650 mg Oral BID  . sodium chloride flush  10-40 mL Intracatheter Q12H   Continuous Infusions: . sodium chloride     PRN Meds:.acetaminophen, naloxone **AND** sodium chloride flush, oxyCODONE, polyethylene glycol, sodium chloride flush  Consultants:  None  Procedures:  None  Antibiotics:  None  Assessment/Plan: Principal Problem:   Sickle cell pain crisis ([place]) Active Problems:   CKD (chronic kidney disease), stage IV (HCC)   Acute kidney injury superimposed on chronic kidney disease (HCC)   Chronic pain syndrome  Sickle cell disease with pain crisis: Continue IV Dilaudid PCA without any changes in settings Decrease IV fluids to 0.45% saline at 50 mL/h Continue to hold IV Toradol due to chronic kidney disease stage IV Oxycodone 10 mg every 6 hours as needed for severe breakthrough pain Monitor vital  signs very closely, reevaluate pain scale regularly, and supplemental oxygen as needed  Leukocytosis: WBCs continue to be elevated.  More than likely secondary to vaso-occlusive pain crisis.  Patient is afebrile without any signs of infection or inflammation.  Continue to follow closely without antibiotics.  Follow labs in AM.  Chronic pain syndrome: Continue home medications  Sickle cell anemia: Hemoglobin decreased to 6.0 g/dL today, which is consistent with patient's baseline of 6.0-7.0 g/dL.  Patient is s/p Aranesp injection on yesterday.  Transfuse patient conservatively.  He is typically not transfused unless hemoglobin is less than 6.  Follow labs in a.m.  Acute kidney injury superimposed on chronic kidney disease stage IV: Creatinine has improved from 3.07<2.83.  Continue gentle hydration.  Continue to avoid all nephrotoxic medications.  Follow closely.   Code Status: Full Code Family Communication: N/A Disposition Plan: Not yet ready for discharge  [place], MSN, FNP-C [facility] [address] [place], [place] [address] [phone]  If 7PM-7AM, please contact night-coverage.  02/27/2020, 12:58  PM  LOS: 0 days

## 2020-02-28 LAB — CBC WITH DIFFERENTIAL/PLATELET
Abs Immature Granulocytes: 0.09 10*3/uL — ABNORMAL HIGH (ref 0.00–0.07)
Basophils Absolute: 0 10*3/uL (ref 0.0–0.1)
Basophils Relative: 0 %
Eosinophils Absolute: 0.3 10*3/uL (ref 0.0–0.5)
Eosinophils Relative: 2 %
HCT: 18.2 % — ABNORMAL LOW (ref 39.0–52.0)
Hemoglobin: 6.2 g/dL — CL (ref 13.0–17.0)
Immature Granulocytes: 1 %
Lymphocytes Relative: 48 %
Lymphs Abs: 7.9 10*3/uL — ABNORMAL HIGH (ref 0.7–4.0)
MCH: 35.8 pg — ABNORMAL HIGH (ref 26.0–34.0)
MCHC: 34.1 g/dL (ref 30.0–36.0)
MCV: 105.2 fL — ABNORMAL HIGH (ref 80.0–100.0)
Monocytes Absolute: 1.6 10*3/uL — ABNORMAL HIGH (ref 0.1–1.0)
Monocytes Relative: 10 %
Neutro Abs: 6.4 10*3/uL (ref 1.7–7.7)
Neutrophils Relative %: 39 %
Platelets: 239 10*3/uL (ref 150–400)
RBC: 1.73 MIL/uL — ABNORMAL LOW (ref 4.22–5.81)
WBC: 16.3 10*3/uL — ABNORMAL HIGH (ref 4.0–10.5)
nRBC: 0.4 % — ABNORMAL HIGH (ref 0.0–0.2)

## 2020-02-28 LAB — URINE CULTURE: Culture: <10000 — AB

## 2020-02-28 IMAGING — DX PORTABLE CHEST - 1 VIEW
1 series · 1 of 1 positions shown · non-contrast
Comparison: Multiple priors, most recently radiograph July 22, 2018

CLINICAL DATA: Chest pain, shortness of breath, history of sickle
cell

EXAM:
PORTABLE CHEST 1 VIEW

[chest ap]
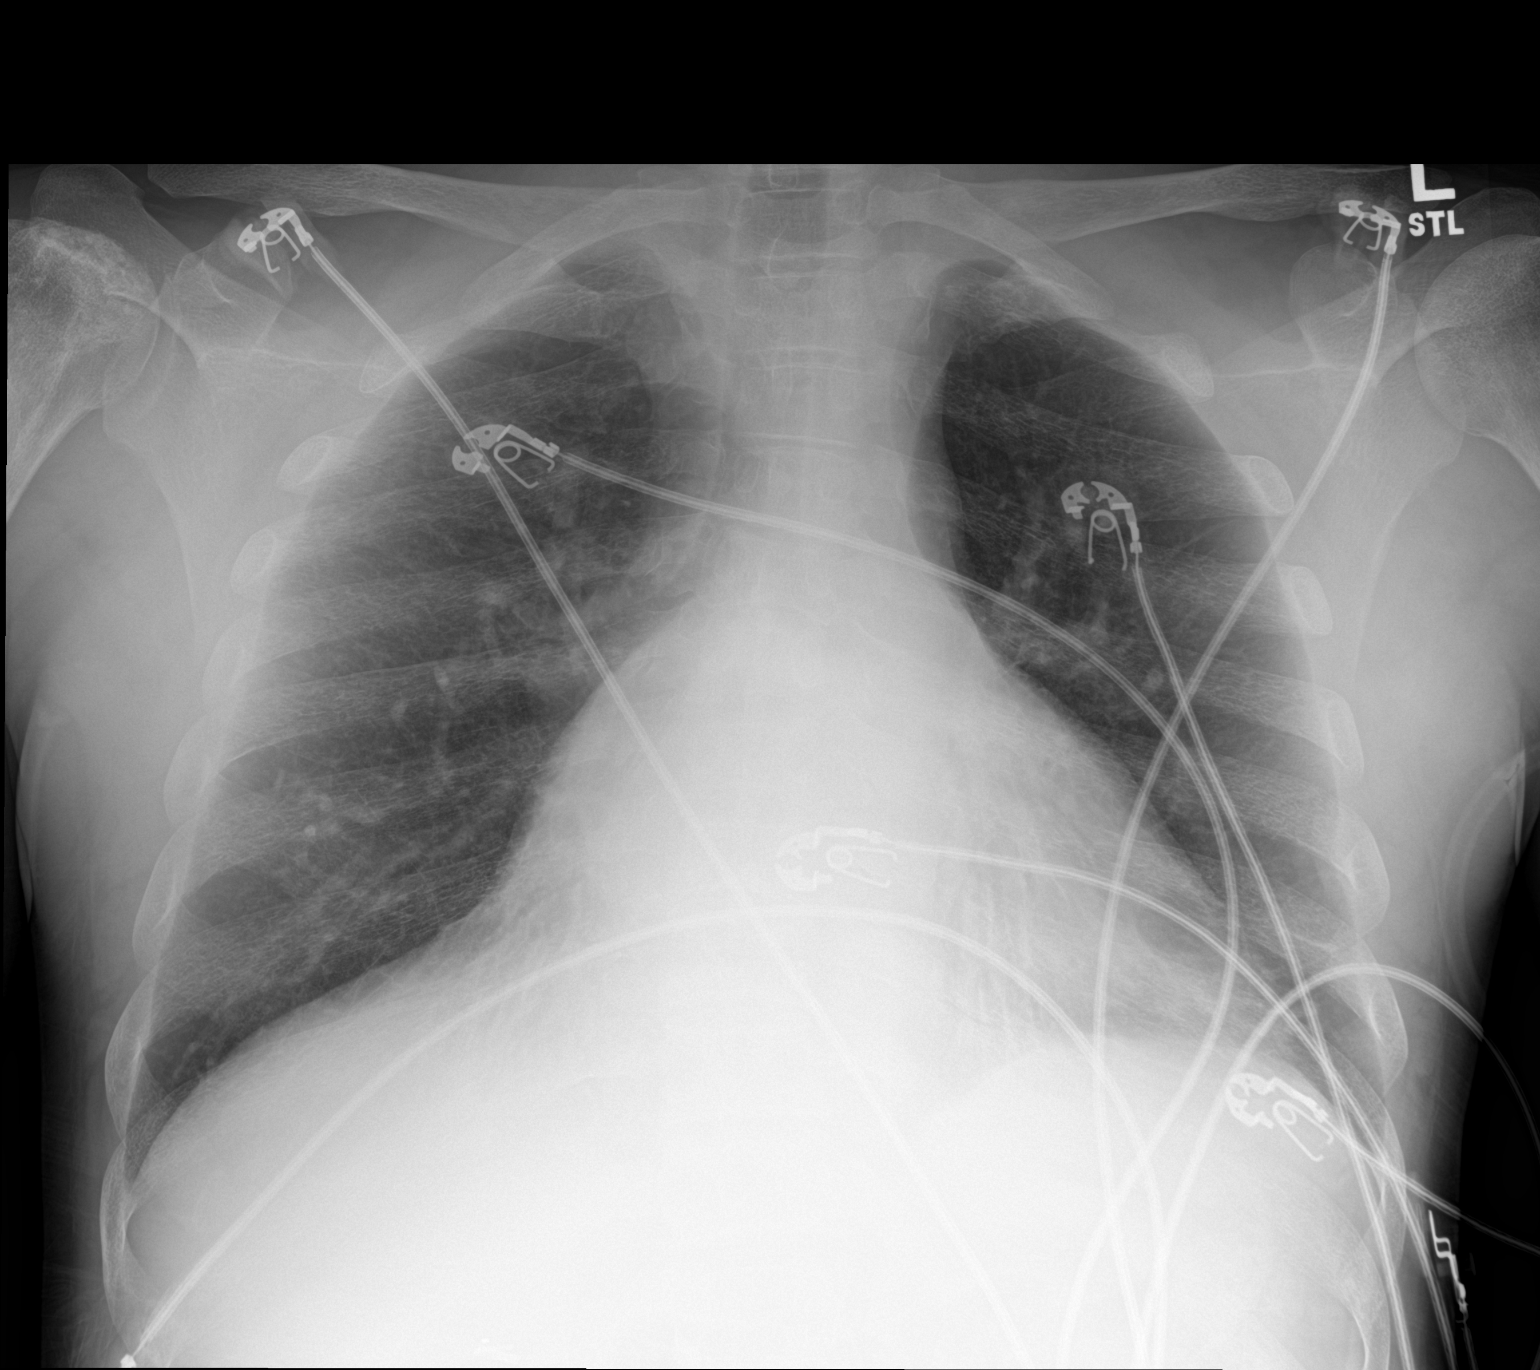

[1 of 1 positions shown; findings below may reference images not displayed]

FINDINGS: Cardiomegaly similar to prior studies. Basilar bronchiectatic
changes are present on prior studies. There is increasing opacity in
the mesial bases. No pneumothorax. No effusion. Osteonecrosis of the
right humeral head and sclerotic endplate changes of the vertebrae
similar to prior studies and compatible with patient's known sickle
cell disease.
IMPRESSION: Increasing opacity in the mesial bases, which could represent
developing pneumonia or atelectasis.

## 2020-02-28 IMAGING — CT CT RENAL STONE PROTOCOL
2 of 3 series · 11 of 46 positions shown, 12 images · non-contrast
Comparison: April 19, 2017
COMPARISON: April 19, 2017
COMPARISON: April 19, 2017

Addendum:
CLINICAL DATA: Right flank pain.  Sickle cell disease.

EXAM:
CT ABDOMEN AND PELVIS WITHOUT CONTRAST
TECHNIQUE: Multidetector CT imaging of the abdomen and pelvis was performed
following the standard protocol without oral or IV contrast.

[Series 4: lung bases · axial · 0.62mm/px · z∈[-122,-54]mm · 8 of 40 slices shown, 9 images]
[im 3/40  soft-tissue]
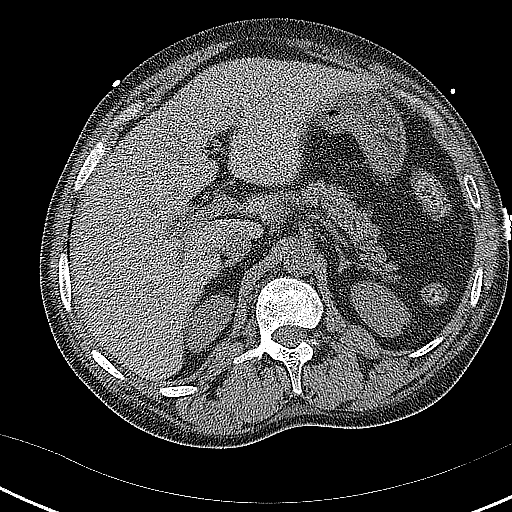
[im 3/40  bone]
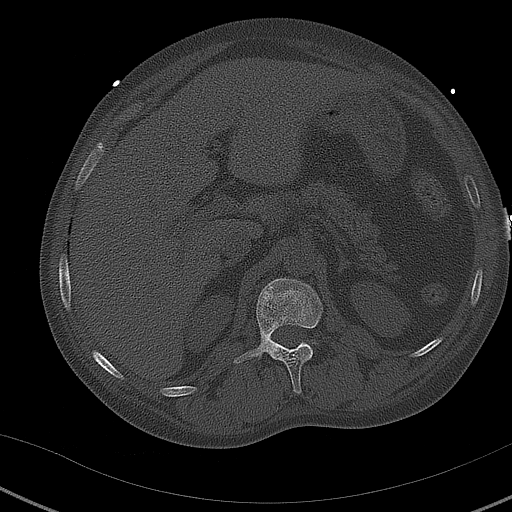
[im 8/40  soft-tissue]
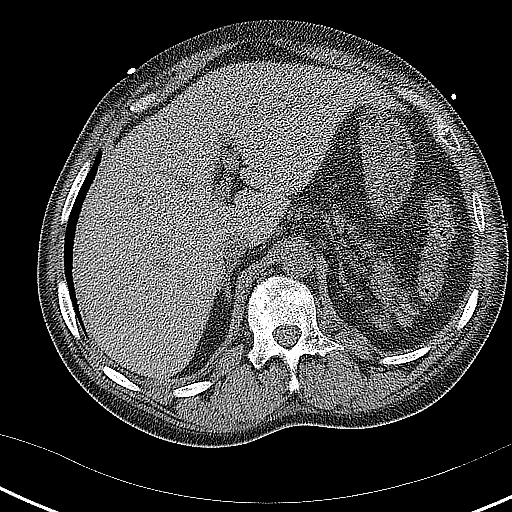
[im 13/40  soft-tissue]
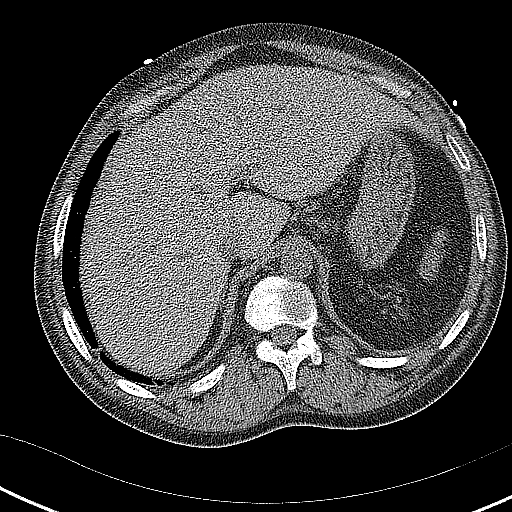
[im 18/40  soft-tissue]
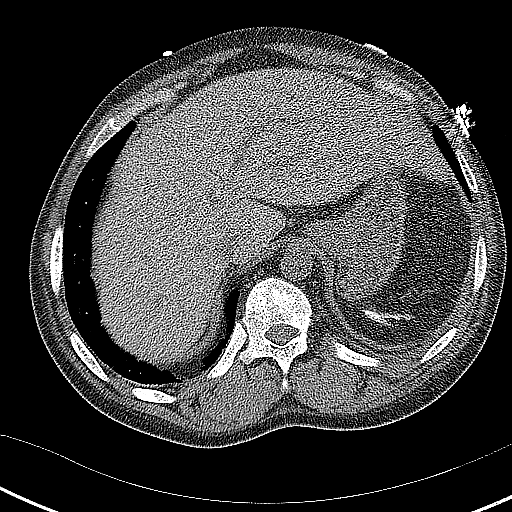
[im 22/40  soft-tissue]
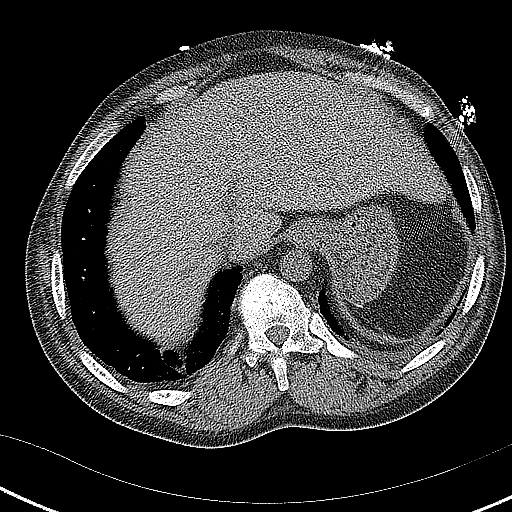
[im 27/40  soft-tissue]
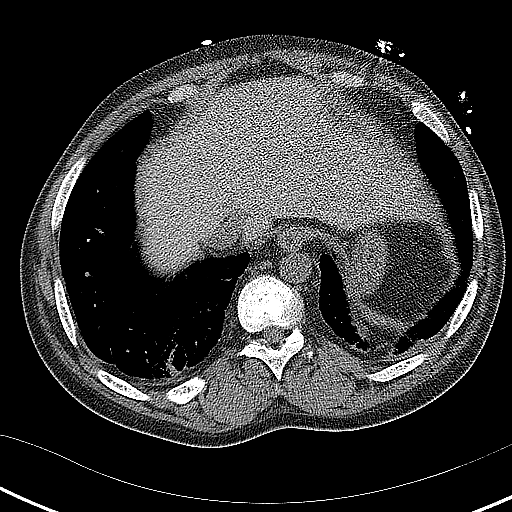
[im 32/40  soft-tissue]
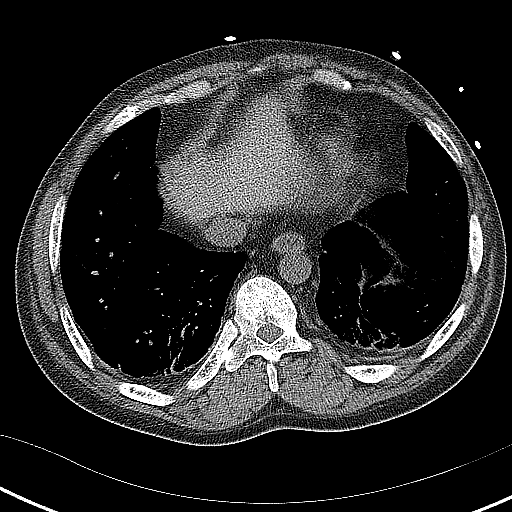
[im 37/40  soft-tissue]
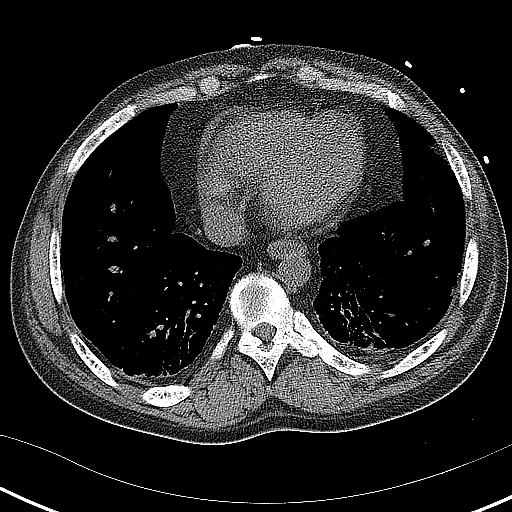

[Series 5: coronal · coronal · 0.60mm/px · 3 of 149 slices shown]
[im 50/149  soft-tissue]
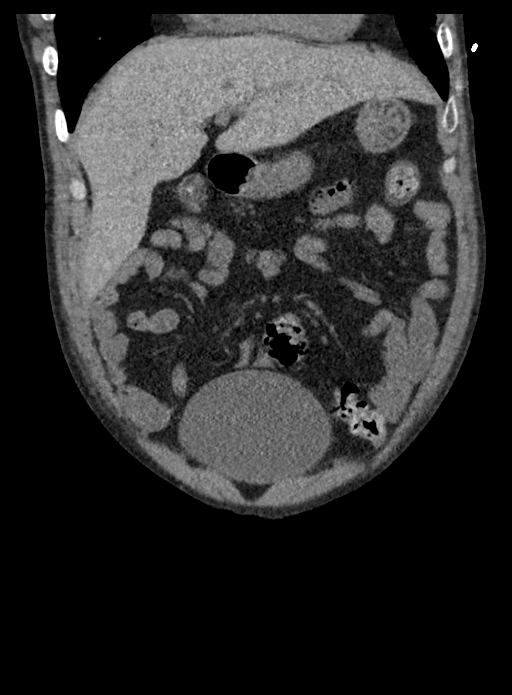
[im 66/149  soft-tissue]
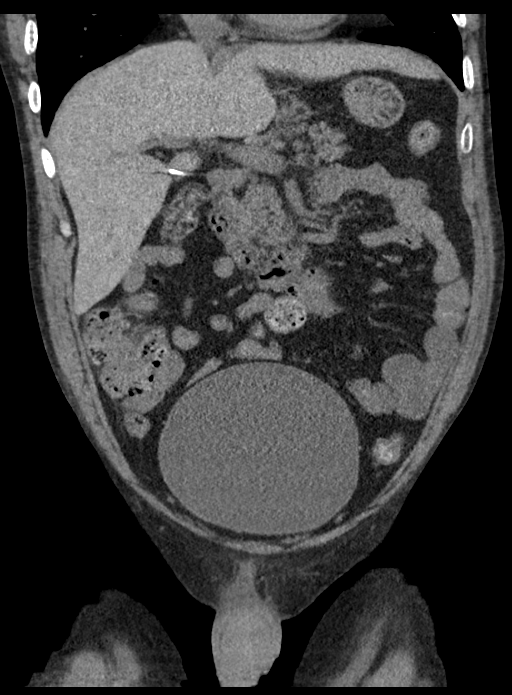
[im 83/149  soft-tissue]
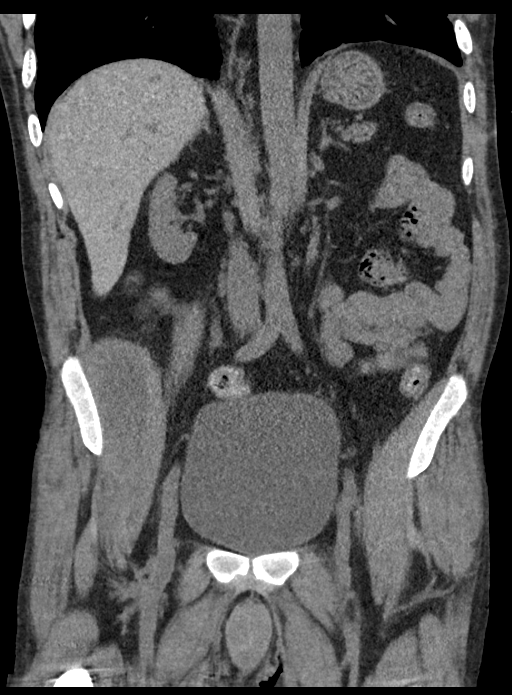

[11 of 46 positions shown; findings below may reference images not displayed]

FINDINGS: Lower chest: There is scarring with bronchiectatic change in the
lung bases. There is no lung base edema or consolidation.

Hepatobiliary: No focal liver lesions are evident on this
noncontrast enhanced study. Gallbladder is absent. There is no
appreciable biliary duct dilatation.

Pancreas: No pancreatic mass or inflammatory focus.

Spleen: Evidence of splenic autoinfarction. Minimal splenic tissue,
calcified.

Adrenals/Urinary Tract: Adrenals appear normal bilaterally. Kidneys
bilaterally show no evident mass or hydronephrosis. There is no
evident renal or ureteral calculus on either side. Urinary bladder
is distended. Urinary bladder wall thickness is within normal
limits.

Stomach/Bowel: There is no appreciable bowel wall or mesenteric
thickening. No evident bowel obstruction. The terminal ileum appears
unremarkable. There is no free air or portal venous air.

Vascular/Lymphatic: There is no abdominal aortic aneurysm. No
vascular lesions are evident on this noncontrast enhanced study. No
adenopathy is appreciable in the abdomen or pelvis.

Reproductive: Prostate and seminal vesicles are normal in size and
contour. No evident pelvic mass.

Other: Appendix appears normal. No abscess or ascites is evident in
the abdomen or pelvis.

Musculoskeletal: Bony structures show sclerosis indicative of known
sickle cell anemia. There is a degree of avascular necrosis in each
femoral head without flattening of the femoral heads. No
intramuscular or abdominal wall lesions. Scarring is noted in the
umbilical region.
IMPRESSION: 1. No evident renal or ureteral calculus. No hydronephrosis. Urinary
bladder is distended without wall thickening. This degree of
distention raises concern for a degree of potential urinary bladder
outlet obstruction.

2. No bowel wall thickening or bowel obstruction. No abscess in the
abdomen or pelvis. Appendix appears normal.

3.  Bony changes indicative of sickle cell disease.

4.  Evidence of prior splenic autoinfarction.

5.  Gallbladder absent.

6.  Scarring and bronchiectatic change in the lung bases.

7.  Periumbilical region scarring anteriorly.

ADDENDUM:
There were technical difficulties regarding the initial dictation.
The corrected remainder of the report is as follows:

There is an apparent iliacus muscle abscess at the level of the mid
right sacrum measuring 7.7 x 6.7 x 3.2 cm. There is bony reactive
change in this area.

There is apparent reflux of fluid into the distal esophagus.

ADDENDUM:
Critical Value/emergent results were called by telephone at the time
of interpretation on 09/23/2018 at [DATE] to Dr. SELAH TRELLES , who
verbally acknowledged these results.

*** End of Addendum ***
Addendum:
FINDINGS: Lower chest: There is scarring with bronchiectatic change in the
lung bases. There is no lung base edema or consolidation.

Hepatobiliary: No focal liver lesions are evident on this
noncontrast enhanced study. Gallbladder is absent. There is no
appreciable biliary duct dilatation.

Pancreas: No pancreatic mass or inflammatory focus.

Spleen: Evidence of splenic autoinfarction. Minimal splenic tissue,
calcified.

Adrenals/Urinary Tract: Adrenals appear normal bilaterally. Kidneys
bilaterally show no evident mass or hydronephrosis. There is no
evident renal or ureteral calculus on either side. Urinary bladder
is distended. Urinary bladder wall thickness is within normal
limits.

Stomach/Bowel: There is no appreciable bowel wall or mesenteric
thickening. No evident bowel obstruction. The terminal ileum appears
unremarkable. There is no free air or portal venous air.

Vascular/Lymphatic: There is no abdominal aortic aneurysm. No
vascular lesions are evident on this noncontrast enhanced study. No
adenopathy is appreciable in the abdomen or pelvis.

Reproductive: Prostate and seminal vesicles are normal in size and
contour. No evident pelvic mass.

Other: Appendix appears normal. No abscess or ascites is evident in
the abdomen or pelvis.

Musculoskeletal: Bony structures show sclerosis indicative of known
sickle cell anemia. There is a degree of avascular necrosis in each
femoral head without flattening of the femoral heads. No
intramuscular or abdominal wall lesions. Scarring is noted in the
umbilical region.
IMPRESSION: 1. No evident renal or ureteral calculus. No hydronephrosis. Urinary
bladder is distended without wall thickening. This degree of
distention raises concern for a degree of potential urinary bladder
outlet obstruction.

2. No bowel wall thickening or bowel obstruction. No abscess in the
abdomen or pelvis. Appendix appears normal.

3.  Bony changes indicative of sickle cell disease.

4.  Evidence of prior splenic autoinfarction.

5.  Gallbladder absent.

6.  Scarring and bronchiectatic change in the lung bases.

7.  Periumbilical region scarring anteriorly.

ADDENDUM:
There were technical difficulties regarding the initial dictation.
The corrected remainder of the report is as follows:

There is an apparent iliacus muscle abscess at the level of the mid
right sacrum measuring 7.7 x 6.7 x 3.2 cm. There is bony reactive
change in this area.

There is apparent reflux of fluid into the distal esophagus.

*** End of Addendum ***
FINDINGS: Lower chest: There is scarring with bronchiectatic change in the
lung bases. There is no lung base edema or consolidation.

Hepatobiliary: No focal liver lesions are evident on this
noncontrast enhanced study. Gallbladder is absent. There is no
appreciable biliary duct dilatation.

Pancreas: No pancreatic mass or inflammatory focus.

Spleen: Evidence of splenic autoinfarction. Minimal splenic tissue,
calcified.

Adrenals/Urinary Tract: Adrenals appear normal bilaterally. Kidneys
bilaterally show no evident mass or hydronephrosis. There is no
evident renal or ureteral calculus on either side. Urinary bladder
is distended. Urinary bladder wall thickness is within normal
limits.

Stomach/Bowel: There is no appreciable bowel wall or mesenteric
thickening. No evident bowel obstruction. The terminal ileum appears
unremarkable. There is no free air or portal venous air.

Vascular/Lymphatic: There is no abdominal aortic aneurysm. No
vascular lesions are evident on this noncontrast enhanced study. No
adenopathy is appreciable in the abdomen or pelvis.

Reproductive: Prostate and seminal vesicles are normal in size and
contour. No evident pelvic mass.

Other: Appendix appears normal. No abscess or ascites is evident in
the abdomen or pelvis.

Musculoskeletal: Bony structures show sclerosis indicative of known
sickle cell anemia. There is a degree of avascular necrosis in each
femoral head without flattening of the femoral heads. No
intramuscular or abdominal wall lesions. Scarring is noted in the
umbilical region.
IMPRESSION: 1. No evident renal or ureteral calculus. No hydronephrosis. Urinary
bladder is distended without wall thickening. This degree of
distention raises concern for a degree of potential urinary bladder
outlet obstruction.

2. No bowel wall thickening or bowel obstruction. No abscess in the
abdomen or pelvis. Appendix appears normal.

3.  Bony changes indicative of sickle cell disease.

4.  Evidence of prior splenic autoinfarction.

5.  Gallbladder absent.

6.  Scarring and bronchiectatic change in the lung bases.

7.  Periumbilical region scarring anteriorly.

## 2020-02-28 NOTE — Progress Notes (Signed)
CRITICAL VALUE ALERT  Critical Value:  hgb 6.2  Date & Time Notied:  02/28/20 @ 1055  Provider Notified: Dr. Jonelle Sidle  Orders Received/Actions taken: No additional orders

## 2020-02-28 NOTE — Discharge Summary (Signed)
Physician Discharge Summary  Patient ID: [name] [name]. MRN: [id number] DOB/AGE: 38/18/1982 39 y.o.  Admit date: 02/26/2020 Discharge date: 02/28/2020  Admission Diagnoses:  Discharge Diagnoses:  Principal Problem:   Sickle cell pain crisis (HCC) Active Problems:   CKD (chronic kidney disease), stage IV (HCC)   Acute kidney injury superimposed on chronic kidney disease (HCC)   Chronic pain syndrome   Discharged Condition: good  Hospital Course: Patient admitted with sickle cell painful crisis.  Has chronic anemia with known chronic kidney disease stage IV.  He had acute on chronic kidney failure with his chronic pain.  Treated with IV Dilaudid PCA.  No Toradol due to renal function.  Also as needed Dilaudid and home regimen.  IV hydration and close monitoring.  Hemoglobin was at 6.  Patient was not transfused because recheck hemoglobin was above 6.  He however continued to have significant pain until the end.  He subsequently improved to the point where he was able to go home on home regimen.  Consults: None  Significant Diagnostic Studies: labs: CBC and CMP checked, hemoglobin is 6.  No transfusion desired.  Treatments: IV hydration and analgesia: acetaminophen and Dilaudid  Discharge Exam: Blood pressure 118/73, pulse (!) 104, temperature 98.2 F (36.8 C), temperature source Oral, resp. rate 14, height 5\' 3"  (1.6 m), weight 57.4 kg, SpO2 97 %. General appearance: alert, cooperative, appears stated age and no distress Head: Normocephalic, without obvious abnormality, atraumatic Eyes: conjunctivae/corneas clear. PERRL, EOM's intact. Fundi benign. Back: symmetric, no curvature. ROM normal. No CVA tenderness. Resp: clear to auscultation bilaterally Cardio: regular rate and rhythm, S1, S2 normal, no murmur, click, rub or gallop GI: soft, non-tender; bowel sounds normal; no masses,  no organomegaly Extremities: extremities normal, atraumatic, no cyanosis or edema Pulses: 2+  and symmetric Skin: Skin color, texture, turgor normal. No rashes or lesions Neurologic: Grossly normal  Disposition: Discharge disposition: 01-Home or Self Care        Allergies as of 02/28/2020      Reactions   Nsaids Other (See Comments)   Kidney disease   Morphine And Related Other (See Comments)   "Real bad headaches"       Medication List    TAKE these medications   acetaminophen 500 MG tablet Commonly known as: TYLENOL Take 1 tablet (500 mg total) by mouth every 6 (six) hours as needed. What changed: reasons to take this   allopurinol 100 MG tablet Commonly known as: ZYLOPRIM Take 100 mg by mouth daily.   folic acid 1 MG tablet Commonly known as: FOLVITE Take 1 tablet (1 mg total) by mouth daily.   nortriptyline 25 MG capsule Commonly known as: PAMELOR TAKE 1 CAPSULE BY MOUTH 2 TIMES DAILY. What changed: See the new instructions.   oxyCODONE 15 mg 12 hr tablet Commonly known as: OxyCONTIN Take 1 tablet (15 mg total) by mouth every 12 (twelve) hours. What changed: Another medication with the same name was changed. Make sure you understand how and when to take each.   Oxycodone HCl 10 MG Tabs Take 1 tablet (10 mg total) by mouth every 6 (six) hours as needed. What changed: reasons to take this   sodium bicarbonate 650 MG tablet Take 650 mg by mouth 2 (two) times daily.   Vitamin D3 50 MCG (2000 UT) Tabs Take 1 each by mouth daily. What changed: how much to take        Signed: Kania Regnier,LAWAL 02/28/2020, 11:16 AM   Time spent 34  minutes

## 2020-02-29 IMAGING — CT CT IMAGE GUIDED DRAINAGE BY PERCUTANEOUS CATHETER
1 of 3 series · 14 of 32 positions shown, 19 images · non-contrast
Comparison: none

INDICATION: 37-year-old with fluid collection involving the right
iliacus/iliopsoas muscle. Concern for abscess.
TECHNIQUE: Informed written consent was obtained from the patient after a
thorough discussion of the procedural risks, benefits and
alternatives. All questions were addressed. A timeout was performed
prior to the initiation of the procedure.

[Series 2: i-spiral 5.0 b31f · axial · 0.77mm/px · z∈[+1037,+1142]mm · 14 of 35 slices shown, 19 images]
[im 3/35  soft-tissue]
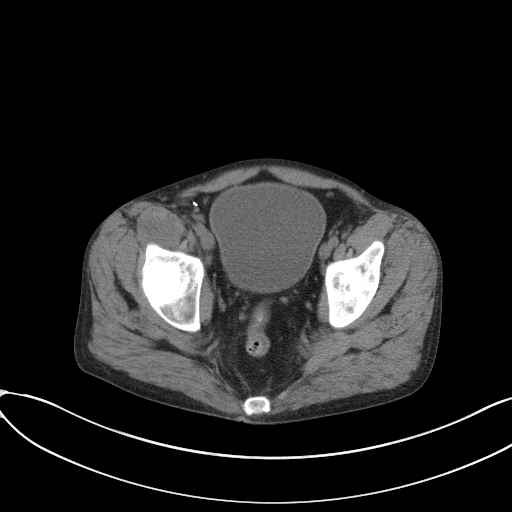
[im 3/35  bone]
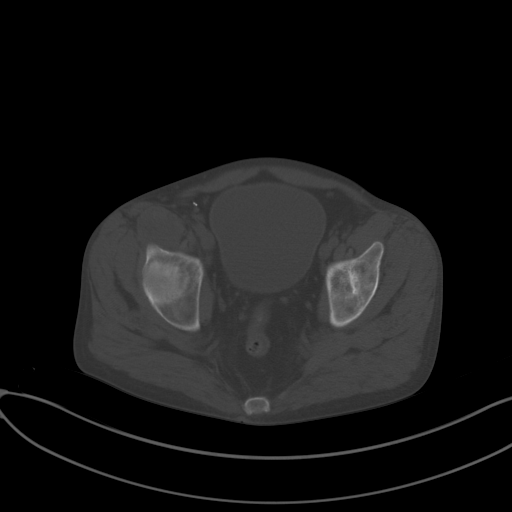
[im 5/35  soft-tissue]
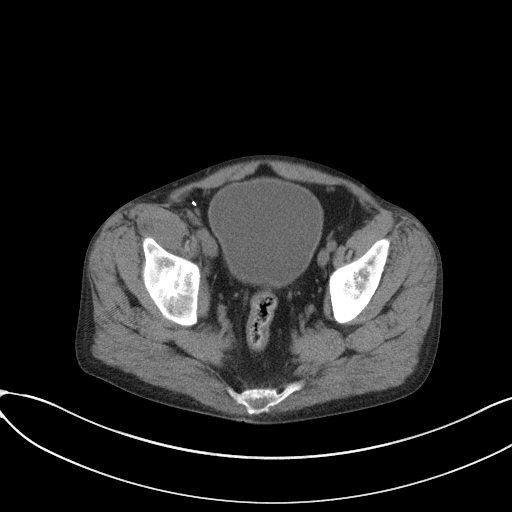
[im 9/35  soft-tissue]
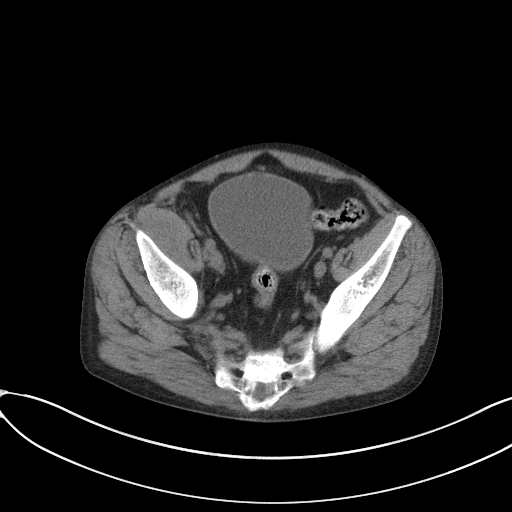
[im 11/35  soft-tissue]
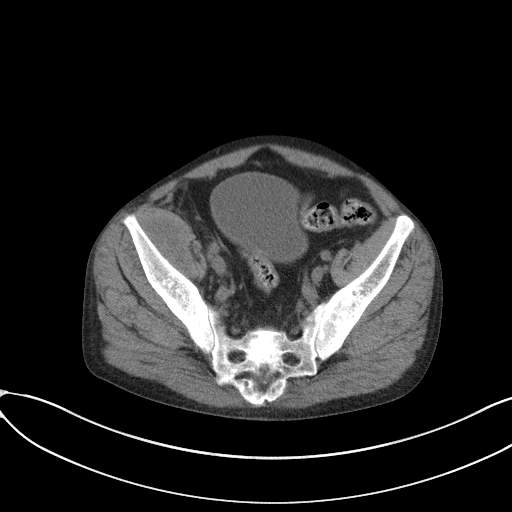
[im 13/35  soft-tissue]
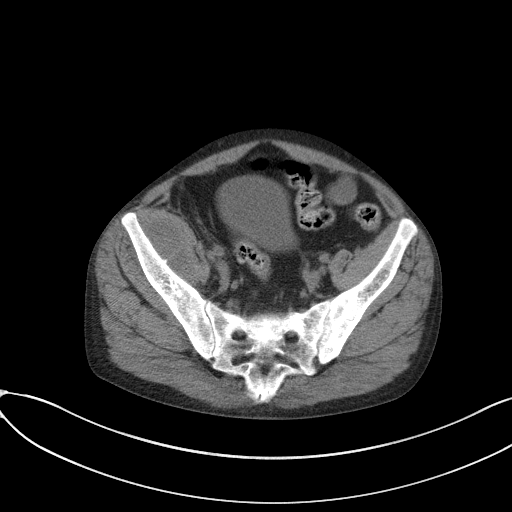
[im 15/35  soft-tissue]
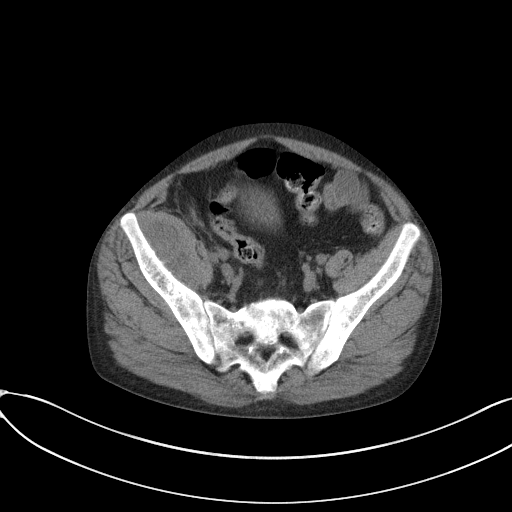
[im 19/35  soft-tissue]
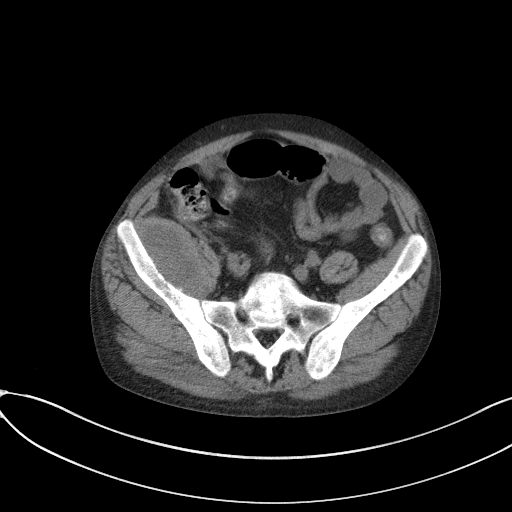
[im 21/35  soft-tissue]
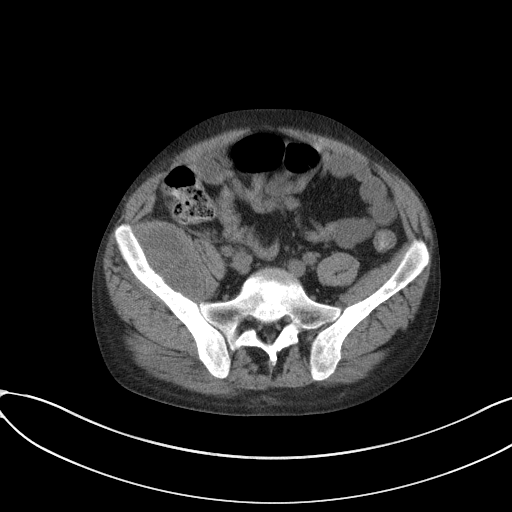
[im 23/35  soft-tissue]
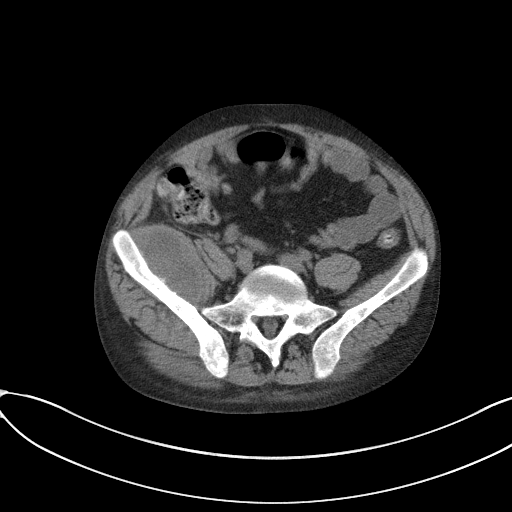
[im 23/35  bone]
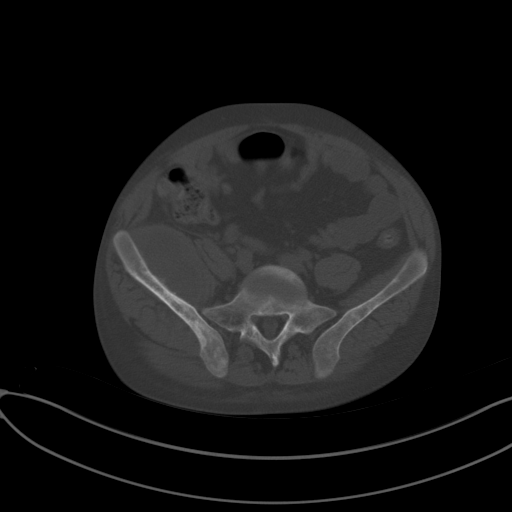
[im 25/35  soft-tissue]
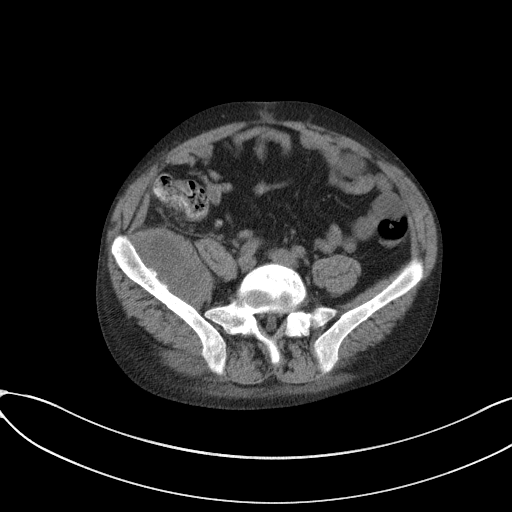
[im 27/35  soft-tissue]
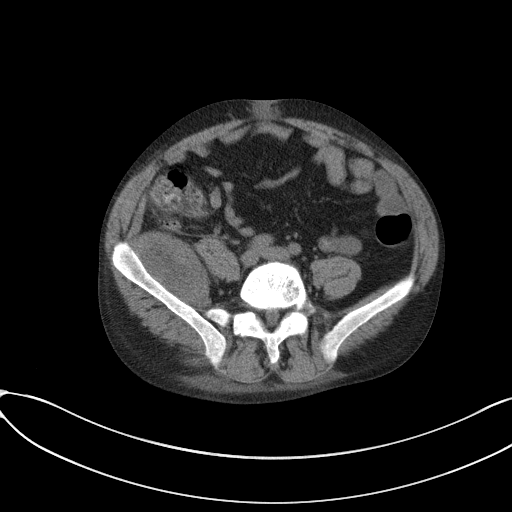
[im 27/35  lung]
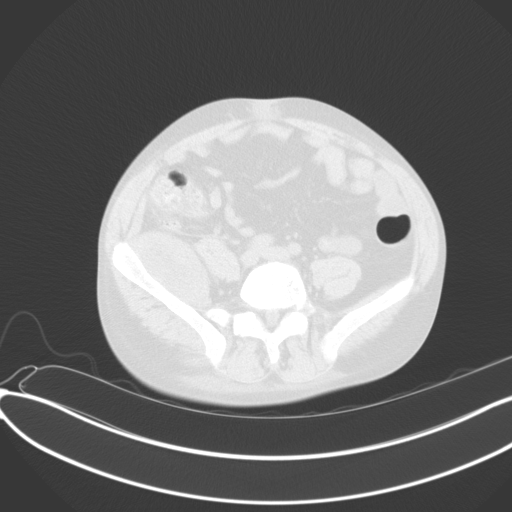
[im 29/35  lung]
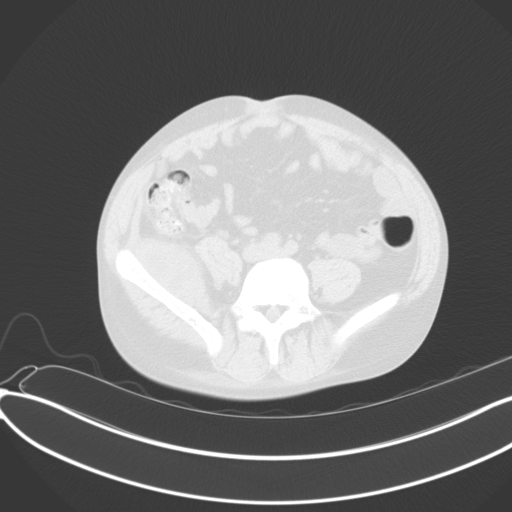
[im 31/35  soft-tissue]
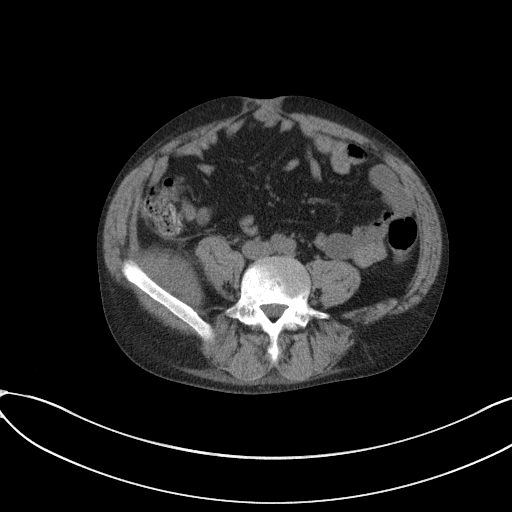
[im 31/35  lung]
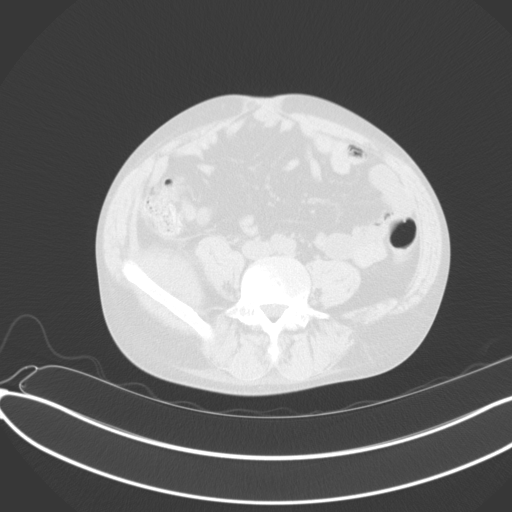
[im 33/35  soft-tissue]
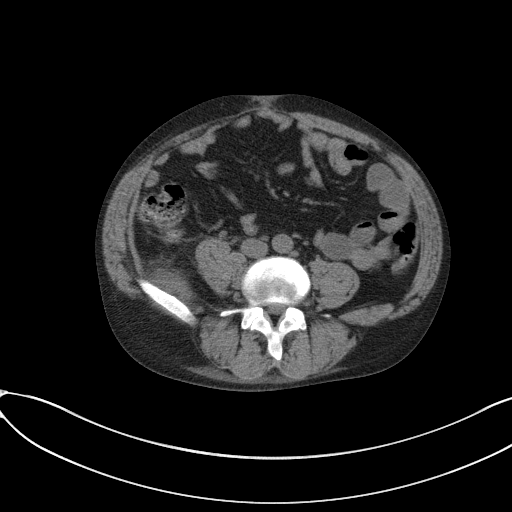
[im 33/35  lung]
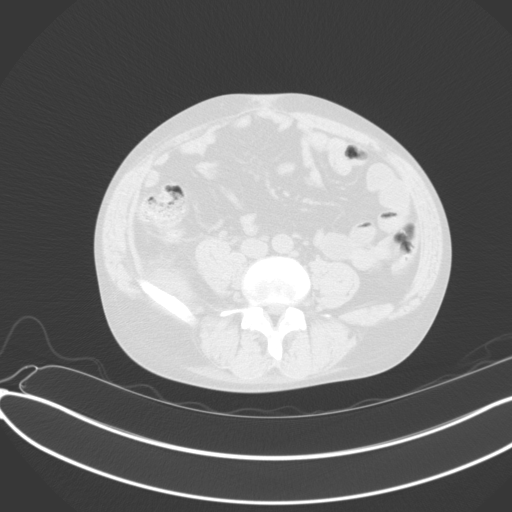

[14 of 32 positions shown; findings below may reference images not displayed]

EXAM:
CT GUIDED DRAINAGE OF RIGHT ILIOPSOAS ABSCESS

MEDICATIONS:
Moderate sedation

ANESTHESIA/SEDATION:
2.0 mg IV Versed 100 mcg IV Fentanyl

Moderate Sedation Time:  20 minutes

The patient was continuously monitored during the procedure by the
interventional radiology nurse under my direct supervision.

COMPLICATIONS:
None immediate.
PROCEDURE:
Supine images of the lower abdomen were obtained with CT. The fluid
collection involving the right iliacus and right iliopsoas were
targeted. Right side of the abdomen was prepped with chlorhexidine
and sterile field was created. Skin and soft tissues were
anesthetized with 1% lidocaine. Using CT guidance, 18 gauge trocar
needle was directed into the fluid collection. Yellow purulent fluid
was aspirated. Stiff Amplatz wire was advanced into the collection
and the tract was dilated to accommodate a 10.2 French drain. Fluid
was collected for culture. Catheter was attached to a suction bulb
and sutured to skin. Dressing was placed.
FINDINGS: Again noted is a low-density collection involving the right iliacus
and right iliopsoas muscle. Yellow purulent fluid was removed from
the collection and compatible with an abscess. Drain placed within
the iliopsoas abscess.
IMPRESSION: CT-guided placement of a drainage catheter within the right
iliopsoas abscess.

## 2020-03-02 ENCOUNTER — Telehealth: Payer: Self-pay | Admitting: Family Medicine

## 2020-03-02 ENCOUNTER — Other Ambulatory Visit (HOSPITAL_COMMUNITY): Payer: Self-pay | Admitting: Family Medicine

## 2020-03-02 DIAGNOSIS — G894 Chronic pain syndrome: Secondary | ICD-10-CM

## 2020-03-02 MED ORDER — OXYCODONE HCL ER 15 MG PO T12A: 15.0000 mg | EXTENDED_RELEASE_TABLET | Freq: Two times a day (BID) | ORAL | 0 refills | Status: DC

## 2020-03-02 MED FILL — OxyCONTIN 15 MG T12A: 15 | 30 days supply | Qty: 60 | Fill #0

## 2020-03-02 NOTE — Progress Notes (Signed)
Reviewed PDMP substance reporting system prior to prescribing opiate medications. No inconsistencies noted.  Meds ordered this encounter  Medications   oxyCODONE (OXYCONTIN) 15 mg 12 hr tablet    Sig: Take 1 tablet (15 mg total) by mouth every 12 (twelve) hours.    Dispense:  60 tablet    Refill:  0    Order Specific Question:   Supervising Provider    Answer:   [name], [name] [[id number]]   Lachina Moore Hollis  APRN, MSN, FNP-C [facility] [address]  [place], [place] [address] [phone]  

## 2020-03-03 NOTE — Telephone Encounter (Signed)
Done

## 2020-03-08 IMAGING — CT CT ABDOMEN AND PELVIS WITHOUT CONTRAST
2 series · 15 of 42 positions shown, 17 images · non-contrast
Comparison: 09/23/2018

CLINICAL DATA: Right iliacus abscess, status post percutaneous
drain, sickle cell disease

EXAM:
CT ABDOMEN AND PELVIS WITHOUT CONTRAST
TECHNIQUE: Multidetector CT imaging of the abdomen and pelvis was performed
following the standard protocol without IV contrast.

[Series 2: routine abdomen pelvis without 5.00 br40 s3 axial · axial · non-contrast · 0.58mm/px · z∈[+1342,+1682]mm · 12 of 80 slices shown, 14 images]
[im 6/80  soft-tissue]
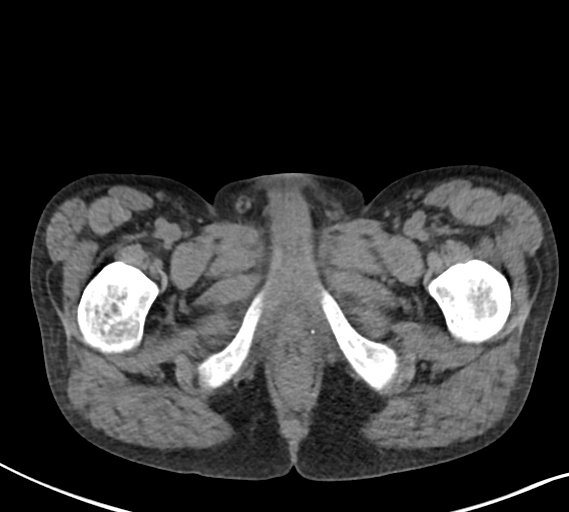
[im 6/80  bone]
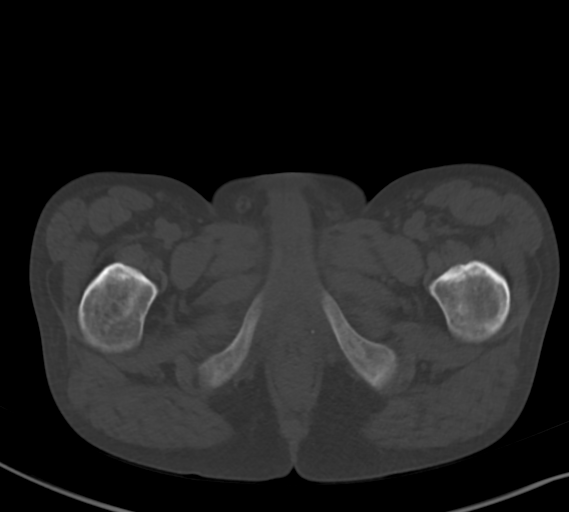
[im 11/80  soft-tissue]
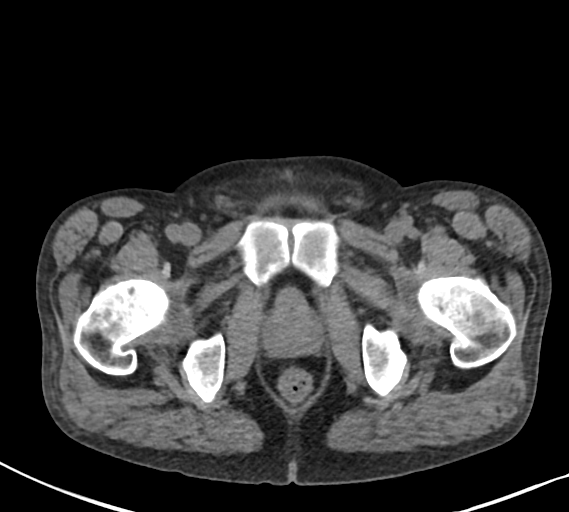
[im 18/80  soft-tissue]
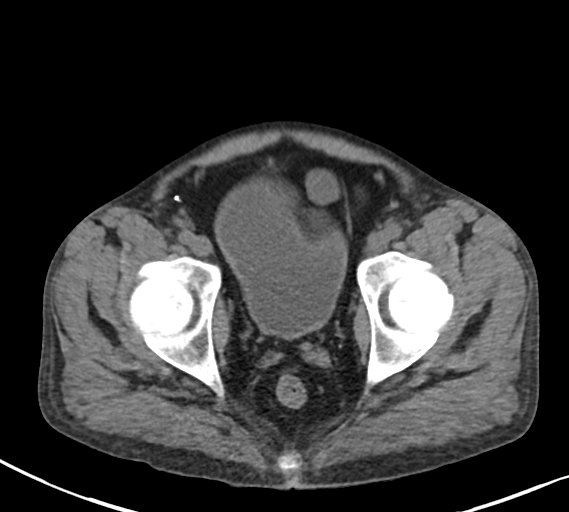
[im 23/80  soft-tissue]
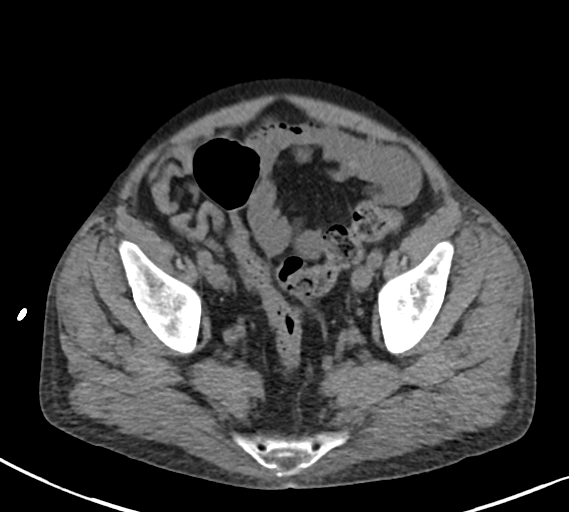
[im 31/80  soft-tissue]
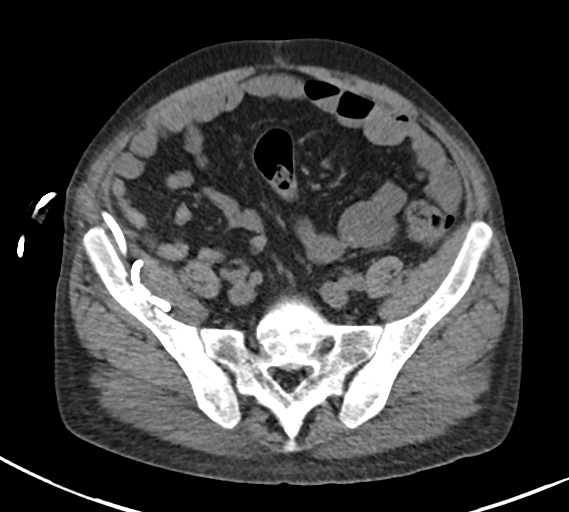
[im 36/80  soft-tissue]
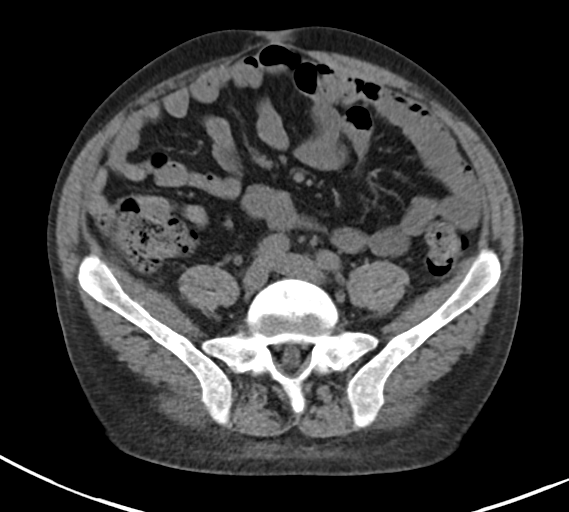
[im 44/80  soft-tissue]
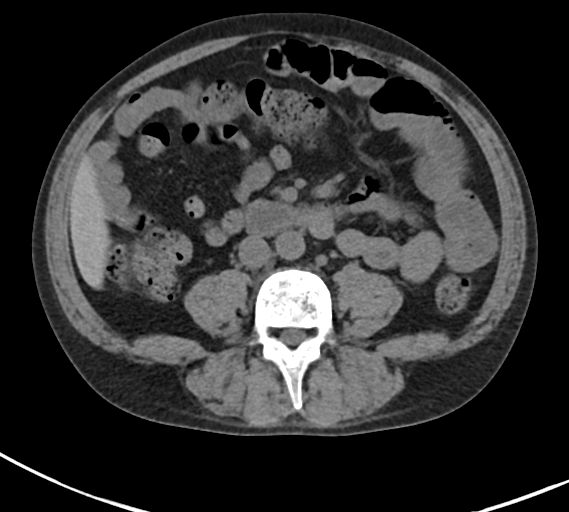
[im 49/80  soft-tissue]
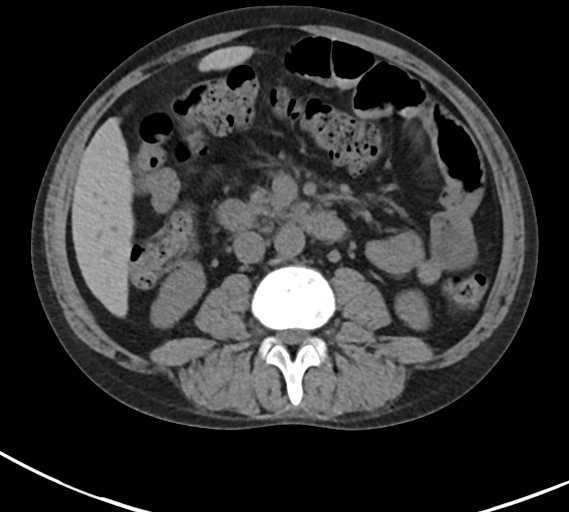
[im 57/80  soft-tissue]
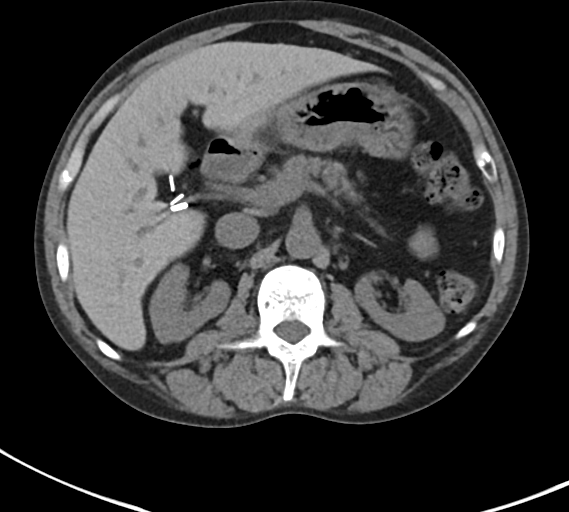
[im 57/80  bone]
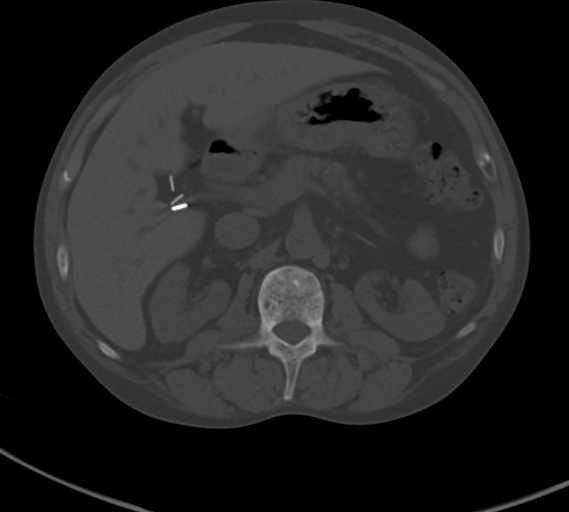
[im 62/80  soft-tissue]
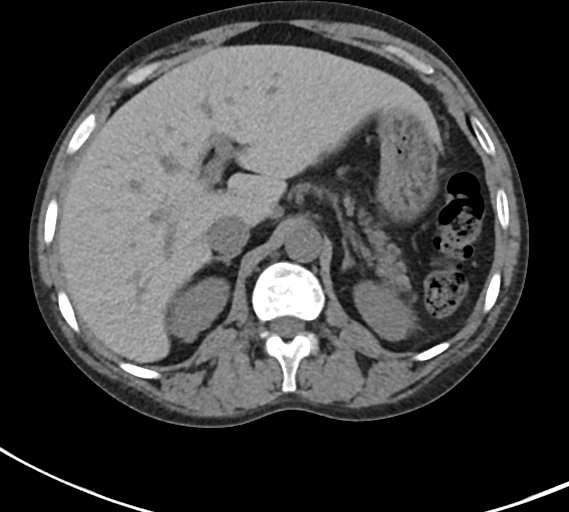
[im 69/80  soft-tissue]
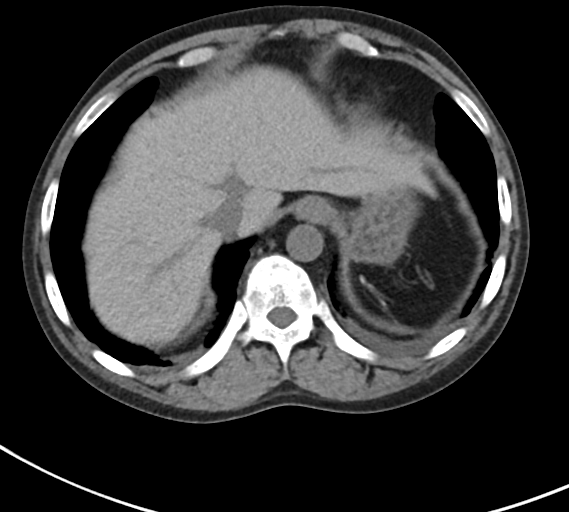
[im 74/80  soft-tissue]
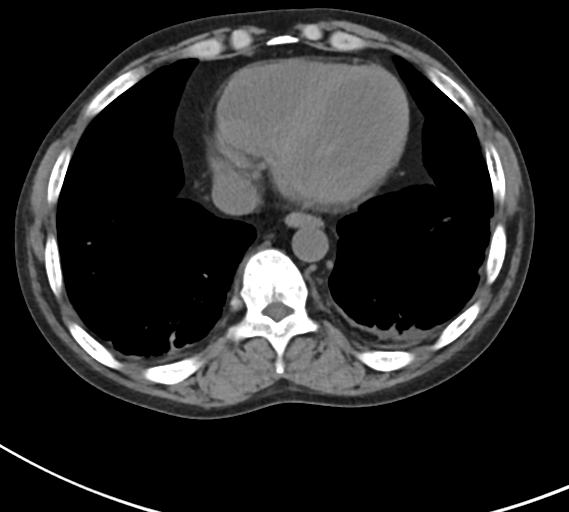

[Series 4: routine abdomen pelvis without 2.00 br40 s3 cor · coronal · non-contrast · 0.64mm/px · 3 of 148 slices shown]
[im 50/148  soft-tissue]
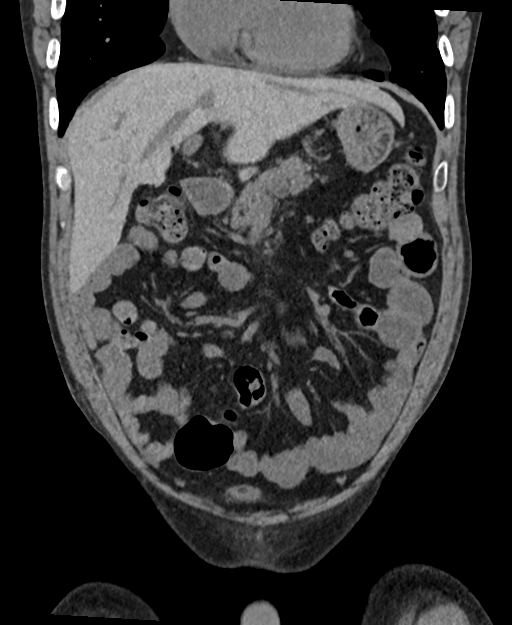
[im 66/148  soft-tissue]
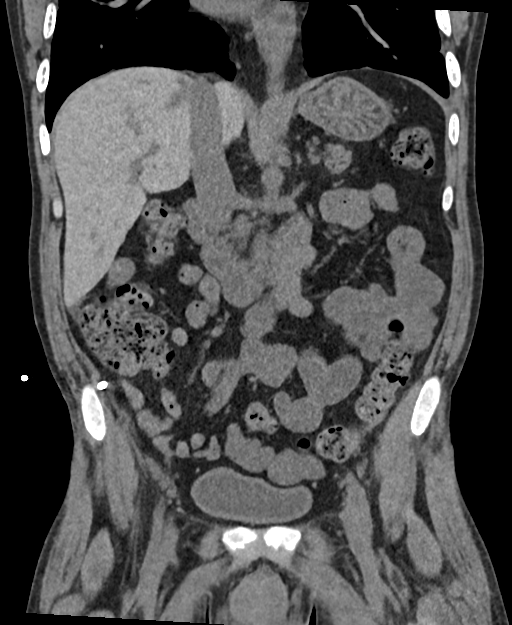
[im 82/148  soft-tissue]
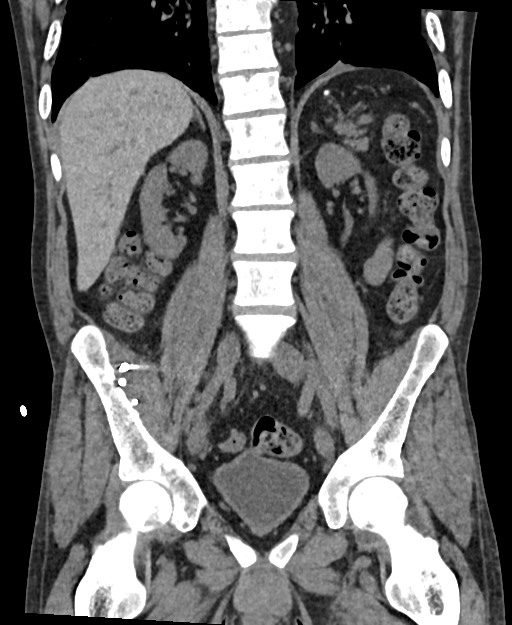

[15 of 42 positions shown; findings below may reference images not displayed]

FINDINGS: Lower chest: Chronic bibasilar atelectasis/scarring. No change in
basilar aeration. No large effusion. No pericardial effusion.

Hepatobiliary: No focal abnormality by noncontrast imaging. No
biliary dilatation. Remote cholecystectomy.

Pancreas: Unremarkable. No pancreatic ductal dilatation or
surrounding inflammatory changes.

Spleen: Chronic splenic artery infarction with calcification.

Adrenals/Urinary Tract: Adrenal glands are unremarkable. Kidneys are
normal, without renal calculi, focal lesion, or hydronephrosis.
Bladder is unremarkable.

Stomach/Bowel: Stomach is within normal limits. Appendix appears
normal. No evidence of bowel wall thickening, distention, or
inflammatory changes.

Vascular/Lymphatic: Negative for aneurysm. No significant
atherosclerotic process. No retroperitoneal hemorrhage or hematoma.
No bulky.

Reproductive: No significant finding by CT.

Other: Interval right iliacus abscess drain placement. Stable drain
catheter position. Trace residual adjacent fluid/muscular
thickening. No new collections.

Musculoskeletal: Chronic osseous changes sickle cell disease.
Bilateral chronic hip AVN changes. No acute osseous finding.
IMPRESSION: Nearly resolved right iliacus abscess following percutaneous drain.
No new collections. Stable CT without contrast.

## 2020-03-16 ENCOUNTER — Other Ambulatory Visit: Payer: Self-pay | Admitting: Family Medicine

## 2020-03-16 ENCOUNTER — Ambulatory Visit (INDEPENDENT_AMBULATORY_CARE_PROVIDER_SITE_OTHER): Payer: 59 | Admitting: Family Medicine

## 2020-03-16 ENCOUNTER — Other Ambulatory Visit: Payer: Self-pay

## 2020-03-16 ENCOUNTER — Encounter: Payer: Self-pay | Admitting: Family Medicine

## 2020-03-16 VITALS — BP 125/87 | HR 101 | Temp 98.3°F | Ht 63.0 in | Wt 128.0 lb

## 2020-03-16 DIAGNOSIS — G894 Chronic pain syndrome: Secondary | ICD-10-CM

## 2020-03-16 DIAGNOSIS — D571 Sickle-cell disease without crisis: Secondary | ICD-10-CM | POA: Diagnosis not present

## 2020-03-16 LAB — POCT URINALYSIS DIPSTICK
Bilirubin, UA: NEGATIVE
Glucose, UA: NEGATIVE
Ketones, UA: NEGATIVE
Leukocytes, UA: NEGATIVE
Nitrite, UA: NEGATIVE
Protein, UA: POSITIVE — AB
Spec Grav, UA: 1.025 (ref 1.010–1.025)
Urobilinogen, UA: 0.2 E.U./dL
pH, UA: 6 (ref 5.0–8.0)

## 2020-03-16 MED ORDER — OXYCODONE HCL 10 MG PO TABS
10.0000 mg | ORAL_TABLET | Freq: Four times a day (QID) | ORAL | 0 refills | Status: DC | PRN
Start: 2020-03-16 — End: 2020-04-01

## 2020-03-16 MED FILL — oxyCODONE HCL 10 MG TABS: 10 | 15 days supply | Qty: 60 | Fill #0

## 2020-03-16 NOTE — Progress Notes (Signed)
[name]    Established Patient Office Visit  Subjective:  Patient ID: [name] Raup., male    DOB: [birth year]  Age: 40 y.o. MRN: [id number]  CC:  Chief Complaint  Patient presents with  . Follow-up    2 month follow up , mild pain     HPI [name] [name]. is a 40 year old male with a medical history significant for sickle cell disease type SS, chronic kidney disease stage IV, history of symptomatic anemia, opiate dependence and tolerance, and history of polysubstance abuse presents for a follow-up of chronic conditions.  Patient says he is doing well and has minimal complaints on today.  He has a history of chronic pain.  Pain intensity is 4/10 primarily to bilateral lower extremities.  Patient says the pain is been moderately controlled on current medication regimen.  Patient is followed by nephrologist for chronic kidney disease stage IV.  Most recent appointment was on 03/15/2020 and patient is awaiting laboratory results. Patient is up-to-date with all vaccinations. He currently denies any subjective fever, chills, chest pain, persistent cough, urinary symptoms, nausea, vomiting, or diarrhea. He has had no sick contacts, recent travel, or known exposure to COVID-19 infection.  Past Medical History:  Diagnosis Date  . Abscess of right iliac fossa 09/24/2018   required Perc Drain 09/24/2018  . Arachnoid Cyst of brain bilaterally    "2 really small ones in the back of my head; inside; saw them w/MRI" (09/25/2012)  . Bacterial pneumonia ~ 2012   "caught it here in the hospital" (09/25/2012)  . Chronic kidney disease    "from my sickle cell" (09/25/2012)  . CKD (chronic kidney disease), stage II   . Colitis 04/19/2017   CT scan abd/ pelvis  . GERD (gastroesophageal reflux disease)    "after I eat alot of spicey foods" (09/25/2012)  . Gynecomastia, male 07/10/2012  . History of blood transfusion    "always related to  sickle cell crisis" (09/25/2012)  . 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 Migraines    "take RX qd to prevent them" (09/25/2012)  . Opioid dependence with withdrawal ([place]) 08/30/2012  . Renal insufficiency   . Sickle cell anemia ([facility])   . Sickle cell crisis ([place]) 09/25/2012  . Sickle cell nephropathy ([place]) 07/10/2012  . Sinus tachycardia   . Tachycardia with heart rate 121-140 beats per minute with ambulation 08/04/2016    Past Surgical History:  Procedure Laterality Date  . ABCESS DRAINAGE    . CHOLECYSTECTOMY  ~ 2012  . COLONOSCOPY N/A 04/23/2017   Procedure: COLONOSCOPY;  Surgeon: Irene Shipper, MD;  Location: [facility];  Service: Endoscopy;  Laterality: N/A;  . IR FLUORO GUIDE CV LINE RIGHT  12/17/2016  . IR RADIOLOGIST EVAL & MGMT  10/02/2018  . IR RADIOLOGIST EVAL & MGMT  10/15/2018  . IR REMOVAL TUN CV CATH W/O FL  12/21/2016  . IR US GUIDE VASC ACCESS RIGHT  12/17/2016  . spleenectomy      Family History  Problem Relation Age of Onset  . Breast cancer Mother     Social History   Socioeconomic History  . Marital status: Single    Spouse name: Not on file  . Number of children: Not on file  . Years of education: Not on file  . Highest education level: Not on file  Occupational History  . Not on file  Tobacco Use  . Smoking status: Current Some Day Smoker    Packs/day: 0.10    Types: Cigarettes  . Smokeless tobacco: Never Used  . Tobacco comment: 09/25/2012 "I don't buy cigarettes; bum one from friends q now and then"  Vaping Use  . Vaping Use: Never used  Substance and Sexual Activity  . Alcohol use: Yes    Alcohol/week: 0.0 standard drinks    Comment: Once a month  . Drug use: Yes    Types: Marijuana    Comment: Every 2-3 weeks   . Sexual activity: Yes  Other Topics Concern  . Not on file  Social History Narrative    Lives alone in [place].   Back at school, studying Media pl[employer]. Unemployed.    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    Social Determinants of Health   Financial Resource Strain: Not on file  Food Insecurity: Not on file  Transportation Needs: Not on file  Physical Activity: Not on file  Stress: Not on file  Social Connections: Not on file  Intimate Partner Violence: Not on file    Outpatient Medications Prior to Visit  Medication Sig Dispense Refill  . acetaminophen (TYLENOL) 500 MG tablet Take 1 tablet (500 mg total) by mouth every 6 (six) hours as needed. (Patient taking differently: Take 500 mg by mouth every 6 (six) hours as needed for mild pain.) 30 tablet 0  . allopurinol (ZYLOPRIM) 100 MG tablet Take 100 mg by mouth daily.    . Cholecalciferol (VITAMIN D3) 50 MCG (2000 UT) TABS Take 1 each by mouth daily. (Patient taking differently: Take 2,000 Units by mouth daily.) 30 tablet 11  . folic acid (FOLVITE) 1 MG tablet Take 1 tablet (1 mg total) by mouth daily. 30 tablet 12  . nortriptyline (PAMELOR) 25 MG capsule TAKE 1 CAPSULE BY MOUTH 2 TIMES DAILY. (Patient taking differently: Take 25 mg by mouth 2 (two) times daily.) 180 capsule 1  . oxyCODONE (OXYCONTIN) 15 mg 12 hr tablet Take 1 tablet (15 mg total) by mouth every 12 (twelve) hours. 60 tablet 0  . Oxycodone HCl 10 MG TABS Take 1 tablet (10 mg total) by mouth every 6 (six) hours as needed. (Patient taking differently: Take 10 mg by mouth every 6 (six) hours as needed (pain).) 60 tablet 0  . sodium bicarbonate 650 MG tablet Take 650 mg by mouth 2 (two) times daily.     No facility-administered medications prior to visit.    Allergies  Allergen Reactions  . Nsaids Other (See Comments)    Kidney disease  . Morphine And Related Other (See Comments)    "Real bad headaches"     ROS Review of Systems  Constitutional: Negative for  activity change and appetite change.  HENT: Negative.   Respiratory: Negative.   Cardiovascular: Negative.   Gastrointestinal: Negative.   Genitourinary: Negative.   Musculoskeletal: Positive for joint swelling and myalgias.  Skin: Negative.   Psychiatric/Behavioral: Negative.       Objective:    Physical Exam Constitutional:      Appearance: Normal appearance.  HENT:     Nose: Nose normal.     Mouth/Throat:     Mouth: Mucous membranes are moist.     Pharynx: Oropharynx is clear.  Eyes:     Pupils: Pupils are equal, round, and  reactive to light.  Cardiovascular:     Rate and Rhythm: Normal rate and regular rhythm.     Pulses: Normal pulses.  Pulmonary:     Effort: Pulmonary effort is normal.  Abdominal:     General: Bowel sounds are normal.  Skin:    General: Skin is warm.  Neurological:     General: No focal deficit present.     Mental Status: He is alert. Mental status is at baseline.  Psychiatric:        Mood and Affect: Mood normal.        Thought Content: Thought content normal.        Judgment: Judgment normal.     BP 125/87   Pulse (!) 101   Temp 98.3 F (36.8 C) (Temporal)   Ht '5\' 3"'$  (1.6 m)   Wt 128 lb (58.1 kg)   SpO2 95%   BMI 22.67 kg/m  Wt Readings from Last 3 Encounters:  03/16/20 128 lb (58.1 kg)  02/27/20 126 lb 8.7 oz (57.4 kg)  01/13/20 128 lb 9.6 oz (58.3 kg)     Health Maintenance Due  Topic Date Due  . COVID-19 Vaccine (3 - Booster for Moderna series) 12/27/2019    There are no preventive care reminders to display for this patient.  Lab Results  Component Value Date   TSH 2.860 05/11/2017   Lab Results  Component Value Date   WBC 16.3 (H) 02/28/2020   HGB 6.2 (LL) 02/28/2020   HCT 18.2 (L) 02/28/2020   MCV 105.2 (H) 02/28/2020   PLT 239 02/28/2020   Lab Results  Component Value Date   NA 138 02/27/2020   K 4.8 02/27/2020   CO2 21 (L) 02/27/2020   GLUCOSE 101 (H) 02/27/2020   BUN 35 (H) 02/27/2020   CREATININE  2.82 (H) 02/27/2020   BILITOT 2.1 (H) 02/26/2020   ALKPHOS 256 (H) 02/26/2020   AST 40 02/26/2020   ALT 30 02/26/2020   PROT 6.5 02/26/2020   ALBUMIN 3.4 (L) 02/26/2020   CALCIUM 8.7 (L) 02/27/2020   ANIONGAP 8 02/27/2020   Lab Results  Component Value Date   CHOL 145 03/07/2018   Lab Results  Component Value Date   HDL 65 03/07/2018   Lab Results  Component Value Date   LDLCALC 30 03/07/2018   Lab Results  Component Value Date   TRIG 251 (H) 03/07/2018   Lab Results  Component Value Date   CHOLHDL 2.2 03/07/2018   Lab Results  Component Value Date   HGBA1C <4.2 (L) 03/07/2018      Assessment & Plan:   Problem List Items Addressed This Visit      Other   Hb-SS disease without crisis ([place]) - Primary   Relevant Orders   POCT Urinalysis Dipstick (Completed)     1. Hb-SS disease without crisis ([place]) For sickle cell has been better controlled over the past several months.  Sickle cell crises are decreasing in frequency.  Patient was receiving Aranesp injections every 2 weeks, hemoglobin has remained consistent with his baseline. Patient is adherent to medication regimen including disease modifying agents. Advised to increase fluid intake Patient is up-to-date with routine eye exam and vaccinations including Covid  - POCT Urinalysis Dipstick - [id number] 11+Oxyco+Alc+Crt-Bund  2. Chronic pain syndrome Reviewed PDMP substance reporting system prior to prescribing opiate medications. No inconsistencies noted.    - Oxycodone HCl 10 MG TABS; Take 1 tablet (10 mg total) by mouth every 6 (six) hours as  needed.  Dispense: 60 tablet; Refill: 0 - [id number] 11+Oxyco+Alc+Crt-Bund No orders of the defined types were placed in this encounter.   Follow-up: Return in about 3 months (around 06/13/2020).    [name]  APRN, MSN, FNP-C [facility] [address] [place], [place] [address] [phone]

## 2020-03-16 NOTE — Patient Instructions (Signed)
Sickle Cell Anemia, Adult  Sickle cell anemia is a condition where your red blood cells are shaped like sickles. Red blood cells carry oxygen through the body. Sickle-shaped cells do not live as long as normal red blood cells. They also clump together and block blood from flowing through the blood vessels. This prevents the body from getting enough oxygen. Sickle cell anemia causes organ damage and pain. It also increases the risk of infection. Follow these instructions at home: Medicines  Take over-the-counter and prescription medicines only as told by your doctor.  If you were prescribed an antibiotic medicine, take it as told by your doctor. Do not stop taking the antibiotic even if you start to feel better.  If you develop a fever, do not take medicines to lower the fever right away. Tell your doctor about the fever. Managing pain, stiffness, and swelling  Try these methods to help with pain: ? Use a heating pad. ? Take a warm bath. ? Distract yourself, such as by watching TV. Eating and drinking  Drink enough fluid to keep your pee (urine) clear or pale yellow. Drink more in hot weather and during exercise.  Limit or avoid alcohol.  Eat a healthy diet. Eat plenty of fruits, vegetables, whole grains, and lean protein.  Take vitamins and supplements as told by your doctor. Traveling  When traveling, keep these with you: ? Your medical information. ? The names of your doctors. ? Your medicines.  If you need to take an airplane, talk to your doctor first. Activity  Rest often.  Avoid exercises that make your heart beat much faster, such as jogging. General instructions  Do not use products that have nicotine or tobacco, such as cigarettes and e-cigarettes. If you need help quitting, ask your doctor.  Consider wearing a medical alert bracelet.  Avoid being in high places (high altitudes), such as mountains.  Avoid very hot or cold temperatures.  Avoid places where the  temperature changes a lot.  Keep all follow-up visits as told by your doctor. This is important. Contact a doctor if:  A joint hurts.  Your feet or hands hurt or swell.  You feel tired (fatigued). Get help right away if:  You have symptoms of infection. These include: ? Fever. ? Chills. ? Being very tired. ? Irritability. ? Poor eating. ? Throwing up (vomiting).  You feel dizzy or faint.  You have new stomach pain, especially on the left side.  You have a an erection (priapism) that lasts more than 4 hours.  You have numbness in your arms or legs.  You have a hard time moving your arms or legs.  You have trouble talking.  You have pain that does not go away when you take medicine.  You are short of breath.  You are breathing fast.  You have a long-term cough.  You have pain in your chest.  You have a bad headache.  You have a stiff neck.  Your stomach looks bloated even though you did not eat much.  Your skin is pale.  You suddenly cannot see well. Summary  Sickle cell anemia is a condition where your red blood cells are shaped like sickles.  Follow your doctor's advice on ways to manage pain, food to eat, activities to do, and steps to take for safe travel.  Get medical help right away if you have any signs of infection, such as a fever. This information is not intended to replace advice given to you by   your health care provider. Make sure you discuss any questions you have with your health care provider. Document Revised: 06/26/2019 Document Reviewed: 06/26/2019 Elsevier Patient Education  2021 [employer].  

## 2020-03-22 ENCOUNTER — Telehealth (HOSPITAL_COMMUNITY): Payer: Self-pay | Admitting: General Practice

## 2020-03-22 ENCOUNTER — Other Ambulatory Visit: Payer: Self-pay | Admitting: Family Medicine

## 2020-03-22 ENCOUNTER — Inpatient Hospital Stay (HOSPITAL_COMMUNITY)
Admission: EM | Admit: 2020-03-22 | Discharge: 2020-03-25 | DRG: 812 | Disposition: A | Discharge status: Home / Self Care | Payer: 59 | Attending: Internal Medicine | Admitting: Internal Medicine

## 2020-03-22 ENCOUNTER — Encounter (HOSPITAL_COMMUNITY): Payer: Self-pay

## 2020-03-22 ENCOUNTER — Other Ambulatory Visit: Payer: Self-pay

## 2020-03-22 DIAGNOSIS — Z20822 Contact with and (suspected) exposure to covid-19: Secondary | ICD-10-CM | POA: Diagnosis present

## 2020-03-22 DIAGNOSIS — Z79899 Other long term (current) drug therapy: Secondary | ICD-10-CM | POA: Diagnosis not present

## 2020-03-22 DIAGNOSIS — K219 Gastro-esophageal reflux disease without esophagitis: Secondary | ICD-10-CM | POA: Diagnosis present

## 2020-03-22 DIAGNOSIS — G43909 Migraine, unspecified, not intractable, without status migrainosus: Secondary | ICD-10-CM | POA: Diagnosis present

## 2020-03-22 DIAGNOSIS — D5700 Hb-SS disease with crisis, unspecified: Secondary | ICD-10-CM | POA: Diagnosis present

## 2020-03-22 DIAGNOSIS — N179 Acute kidney failure, unspecified: Secondary | ICD-10-CM | POA: Diagnosis present

## 2020-03-22 DIAGNOSIS — Z9049 Acquired absence of other specified parts of digestive tract: Secondary | ICD-10-CM

## 2020-03-22 DIAGNOSIS — D849 Immunodeficiency, unspecified: Secondary | ICD-10-CM | POA: Diagnosis present

## 2020-03-22 DIAGNOSIS — F17210 Nicotine dependence, cigarettes, uncomplicated: Secondary | ICD-10-CM | POA: Diagnosis present

## 2020-03-22 DIAGNOSIS — J329 Chronic sinusitis, unspecified: Secondary | ICD-10-CM | POA: Diagnosis present

## 2020-03-22 DIAGNOSIS — G894 Chronic pain syndrome: Secondary | ICD-10-CM | POA: Diagnosis present

## 2020-03-22 DIAGNOSIS — N184 Chronic kidney disease, stage 4 (severe): Secondary | ICD-10-CM | POA: Diagnosis present

## 2020-03-22 DIAGNOSIS — D72829 Elevated white blood cell count, unspecified: Secondary | ICD-10-CM | POA: Diagnosis present

## 2020-03-22 DIAGNOSIS — J019 Acute sinusitis, unspecified: Secondary | ICD-10-CM | POA: Diagnosis present

## 2020-03-22 DIAGNOSIS — D571 Sickle-cell disease without crisis: Secondary | ICD-10-CM

## 2020-03-22 DIAGNOSIS — R519 Headache, unspecified: Secondary | ICD-10-CM

## 2020-03-22 DIAGNOSIS — D638 Anemia in other chronic diseases classified elsewhere: Secondary | ICD-10-CM | POA: Diagnosis present

## 2020-03-22 DIAGNOSIS — D649 Anemia, unspecified: Secondary | ICD-10-CM

## 2020-03-22 LAB — RETICULOCYTES
Immature Retic Fract: 33.6 % — ABNORMAL HIGH (ref 2.3–15.9)
RBC.: 1.49 MIL/uL — ABNORMAL LOW (ref 4.22–5.81)
Retic Count, Absolute: 153.3 10*3/uL (ref 19.0–186.0)
Retic Ct Pct: 10.3 % — ABNORMAL HIGH (ref 0.4–3.1)

## 2020-03-22 LAB — CBC WITH DIFFERENTIAL/PLATELET
Abs Immature Granulocytes: 0.07 10*3/uL (ref 0.00–0.07)
Basophils Absolute: 0 10*3/uL (ref 0.0–0.1)
Basophils Relative: 0 %
Eosinophils Absolute: 0.4 10*3/uL (ref 0.0–0.5)
Eosinophils Relative: 2 %
HCT: 17.4 % — ABNORMAL LOW (ref 39.0–52.0)
Hemoglobin: 6.4 g/dL — CL (ref 13.0–17.0)
Immature Granulocytes: 0 %
Lymphocytes Relative: 31 %
Lymphs Abs: 6 10*3/uL — ABNORMAL HIGH (ref 0.7–4.0)
MCH: 40.1 pg — ABNORMAL HIGH (ref 26.0–34.0)
MCHC: 36.2 g/dL — ABNORMAL HIGH (ref 30.0–36.0)
MCV: 110.8 fL — ABNORMAL HIGH (ref 80.0–100.0)
Monocytes Absolute: 1.7 10*3/uL — ABNORMAL HIGH (ref 0.1–1.0)
Monocytes Relative: 9 %
Neutro Abs: 11.1 10*3/uL — ABNORMAL HIGH (ref 1.7–7.7)
Neutrophils Relative %: 58 %
Platelets: 346 10*3/uL (ref 150–400)
RBC: 1.57 MIL/uL — ABNORMAL LOW (ref 4.22–5.81)
WBC: 19.2 10*3/uL — ABNORMAL HIGH (ref 4.0–10.5)
nRBC: 0.3 % — ABNORMAL HIGH (ref 0.0–0.2)

## 2020-03-22 LAB — COMPREHENSIVE METABOLIC PANEL
ALT: 21 U/L (ref 0–44)
AST: 35 U/L (ref 15–41)
Albumin: 3.3 g/dL — ABNORMAL LOW (ref 3.5–5.0)
Alkaline Phosphatase: 248 U/L — ABNORMAL HIGH (ref 38–126)
Anion gap: 10 (ref 5–15)
BUN: 44 mg/dL — ABNORMAL HIGH (ref 6–20)
CO2: 21 mmol/L — ABNORMAL LOW (ref 22–32)
Calcium: 8.6 mg/dL — ABNORMAL LOW (ref 8.9–10.3)
Chloride: 105 mmol/L (ref 98–111)
Creatinine, Ser: 3.01 mg/dL — ABNORMAL HIGH (ref 0.61–1.24)
GFR, Estimated: 26 mL/min — ABNORMAL LOW (ref >60)
Glucose, Bld: 100 mg/dL — ABNORMAL HIGH (ref 70–99)
Potassium: 4.3 mmol/L (ref 3.5–5.1)
Sodium: 136 mmol/L (ref 135–145)
Total Bilirubin: 1.8 mg/dL — ABNORMAL HIGH (ref 0.3–1.2)
Total Protein: 7.3 g/dL (ref 6.5–8.1)

## 2020-03-22 LAB — POC SARS CORONAVIRUS 2 AG -  ED: SARS Coronavirus 2 Ag: NEGATIVE

## 2020-03-22 LAB — PREPARE RBC (CROSSMATCH)

## 2020-03-22 MED ORDER — VITAMIN D3 25 MCG (1000 UNIT) PO TABS
2000.0000 [IU] | ORAL_TABLET | Freq: Every day | ORAL | Status: DC
  Administered 2020-03-23 – 2020-03-25 (×3): 2000 [IU] via ORAL
  Filled 2020-03-22 (×3): qty 2

## 2020-03-22 MED ORDER — SODIUM BICARBONATE 650 MG PO TABS
650.0000 mg | ORAL_TABLET | Freq: Two times a day (BID) | ORAL | Status: DC
  Administered 2020-03-22 – 2020-03-25 (×6): 650 mg via ORAL
  Filled 2020-03-22 (×6): qty 1

## 2020-03-22 MED ORDER — SODIUM CHLORIDE 0.9% FLUSH: 9.0000 mL | INTRAVENOUS | Status: DC | PRN

## 2020-03-22 MED ORDER — SODIUM CHLORIDE 0.9 % IV SOLN: 10.0000 mL/h | Freq: Once | INTRAVENOUS | Status: DC

## 2020-03-22 MED ORDER — HYDROMORPHONE HCL 2 MG/ML IJ SOLN
2.0000 mg | INTRAMUSCULAR | Status: AC | PRN
  Administered 2020-03-22 (×3): 2 mg via INTRAVENOUS
  Filled 2020-03-22 (×3): qty 1

## 2020-03-22 MED ORDER — FOLIC ACID 1 MG PO TABS
1.0000 mg | ORAL_TABLET | Freq: Every day | ORAL | Status: DC
  Administered 2020-03-23 – 2020-03-25 (×3): 1 mg via ORAL
  Filled 2020-03-22 (×3): qty 1

## 2020-03-22 MED ORDER — HYDROMORPHONE HCL 2 MG/ML IJ SOLN
2.0000 mg | INTRAMUSCULAR | Status: AC
  Administered 2020-03-22: 2 mg via INTRAVENOUS
  Filled 2020-03-22: qty 1

## 2020-03-22 MED ORDER — ALLOPURINOL 100 MG PO TABS
100.0000 mg | ORAL_TABLET | Freq: Every day | ORAL | Status: DC
  Administered 2020-03-23 – 2020-03-25 (×3): 100 mg via ORAL
  Filled 2020-03-22 (×3): qty 1

## 2020-03-22 MED ORDER — NORTRIPTYLINE HCL 25 MG PO CAPS
25.0000 mg | ORAL_CAPSULE | Freq: Two times a day (BID) | ORAL | Status: DC
  Administered 2020-03-22 – 2020-03-25 (×6): 25 mg via ORAL
  Filled 2020-03-22 (×7): qty 1

## 2020-03-22 MED ORDER — SENNOSIDES-DOCUSATE SODIUM 8.6-50 MG PO TABS
1.0000 | ORAL_TABLET | Freq: Two times a day (BID) | ORAL | Status: DC
  Filled 2020-03-22 (×6): qty 1

## 2020-03-22 MED ORDER — DEXTROSE-NACL 5-0.45 % IV SOLN: INTRAVENOUS | Status: DC

## 2020-03-22 MED ORDER — NALOXONE HCL 0.4 MG/ML IJ SOLN: 0.4000 mg | INTRAMUSCULAR | Status: DC | PRN

## 2020-03-22 MED ORDER — DIPHENHYDRAMINE HCL 50 MG/ML IJ SOLN
25.0000 mg | Freq: Four times a day (QID) | INTRAMUSCULAR | Status: DC | PRN
  Administered 2020-03-22: 25 mg via INTRAVENOUS
  Filled 2020-03-22: qty 1

## 2020-03-22 MED ORDER — HYDROMORPHONE 1 MG/ML IV SOLN
INTRAVENOUS | Status: DC
  Administered 2020-03-22: 30 mg via INTRAVENOUS
  Administered 2020-03-22 (×2): 3.5 mg via INTRAVENOUS
  Administered 2020-03-23: 3 mg via INTRAVENOUS
  Administered 2020-03-23: 3.5 mg via INTRAVENOUS
  Administered 2020-03-23: 4.5 mg via INTRAVENOUS
  Administered 2020-03-23: 30 mg via INTRAVENOUS
  Administered 2020-03-23: 2.5 mg via INTRAVENOUS
  Administered 2020-03-24: 6.5 mg via INTRAVENOUS
  Administered 2020-03-24: 1 mg via INTRAVENOUS
  Administered 2020-03-24: 8 mg via INTRAVENOUS
  Administered 2020-03-24: 6.5 mg via INTRAVENOUS
  Administered 2020-03-24: 30 mg via INTRAVENOUS
  Administered 2020-03-24: 2 mg via INTRAVENOUS
  Administered 2020-03-24: 0.5 mg via INTRAVENOUS
  Administered 2020-03-25: 2.5 mg via INTRAVENOUS
  Administered 2020-03-25: 4 mg via INTRAVENOUS
  Administered 2020-03-25: 4.5 mg via INTRAVENOUS
  Filled 2020-03-22 (×3): qty 30

## 2020-03-22 MED ORDER — POLYETHYLENE GLYCOL 3350 17 G PO PACK: 17.0000 g | PACK | Freq: Every day | ORAL | Status: DC | PRN

## 2020-03-22 MED ORDER — ONDANSETRON HCL 4 MG/2ML IJ SOLN
4.0000 mg | INTRAMUSCULAR | Status: DC | PRN
  Administered 2020-03-22 – 2020-03-24 (×2): 4 mg via INTRAVENOUS
  Filled 2020-03-22 (×2): qty 2

## 2020-03-22 MED ORDER — OXYCODONE HCL 5 MG PO TABS
10.0000 mg | ORAL_TABLET | Freq: Four times a day (QID) | ORAL | Status: DC | PRN
  Administered 2020-03-23 – 2020-03-25 (×5): 10 mg via ORAL
  Filled 2020-03-22 (×5): qty 2

## 2020-03-22 MED ORDER — OXYCODONE HCL ER 15 MG PO T12A
15.0000 mg | EXTENDED_RELEASE_TABLET | Freq: Two times a day (BID) | ORAL | Status: DC
  Administered 2020-03-22 – 2020-03-25 (×6): 15 mg via ORAL
  Filled 2020-03-22 (×6): qty 1

## 2020-03-22 NOTE — ED Notes (Signed)
Blood transfusion consent reviewed and signed by pt. Blood hung and RN Danielle signed off. This RN in pt room for 15 min after transfusion.

## 2020-03-22 NOTE — ED Notes (Signed)
Date and time results received: 03/22/20 9:47 AM    Test: Hgb Critical Value: 6.4  Name of Provider Notified: [place]  Orders Received? Or Actions Taken?: waiting for orders

## 2020-03-22 NOTE — Progress Notes (Signed)
When doing nursing admission history on patient, I asked him what belongings he brought with him and told him that the hospital is not responsible for them. Pt would not state what belongings he has in his bag. He said "Ain't nobody [place] mess with it." Visibile belongings that I can see in room are glasses, cell phone, and clothing. I asked pt if he has any medications with him and he states no. Lucius Conn BSN, RN-BC Admissions RN 03/22/2020 4:40 PM

## 2020-03-22 NOTE — H&P (Signed)
H&P  Patient Demographics:  [name] [name], is a 40 y.o. male  MRN: [id number]   DOB - 1980/07/05  Admit Date - 03/22/2020  Outpatient Primary MD for the patient is [name], FNP  Chief Complaint  Patient presents with  . Sickle Cell Pain Crisis  . Migraine      HPI:   [name] [name]  is a 40 y.o. male with a medical history significant for sickle cell anemia type SS, chronic kidney disease stage IV, history of symptomatic anemia, opiate dependence and tolerance, and history of polysubstance abuse presented to the emergency department complaining of generalized pain that is consistent with his typical pain crisis.  Also, patient reports that he has had a headache for the past 3 days that has been unrelieved by his home medications.  Patient has been taking oxycodone and OxyContin as prescribed without sustained relief.  Currently pain intensity is 8/10 primarily to low back, lower extremities.  Also, patient continues to complain a headache with some blurred vision and dizziness.  He denies any fever, chills, persistent cough, chest pain, shortness of breath, urinary symptoms, nausea, vomiting, or diarrhea.  Patient has had no sick contacts, recent travel, or exposure to COVID-19.  He has been fully vaccinated against COVID-19.  Emergency room course: While in the emergency room, patient found to have a hemoglobin of 6.4 which is consistent with his baseline.  However, patient was transfused 1 unit PRBCs.  WBCs elevated at 19.2.  Chest x-ray shows no acute cardiopulmonary process.  Creatinine 3.01, increased from patient's baseline 2.5-2.8.  Patient is afebrile.  Oxygen saturation is 100% on RA.  All other vital signs unremarkable.  Patient's pain persists despite IV Dilaudid and IV fluids.  Sickle cell team notified for admission.  Patient admitted for further management of sickle cell pain crisis.   Review of systems:  Review of Systems  Constitutional: Positive for  malaise/fatigue. Negative for chills and fever.  HENT: Negative.   Respiratory: Negative for cough, sputum production and shortness of breath.   Cardiovascular: Negative.   Gastrointestinal: Negative.  Negative for abdominal pain, nausea and vomiting.  Genitourinary: Negative.   Musculoskeletal: Positive for back pain and joint pain.  Neurological: Positive for dizziness and headaches. Negative for weakness.    With Past History of the following :   Past Medical History:  Diagnosis Date  . Abscess of right iliac fossa 09/24/2018   required Perc Drain 09/24/2018  . Arachnoid Cyst of brain bilaterally    "2 really small ones in the back of my head; inside; saw them w/MRI" (09/25/2012)  . Bacterial pneumonia ~ 2012   "caught it here in the hospital" (09/25/2012)  . Chronic kidney disease    "from my sickle cell" (09/25/2012)  . CKD (chronic kidney disease), stage II   . Colitis 04/19/2017   CT scan abd/ pelvis  . GERD (gastroesophageal reflux disease)    "after I eat alot of spicey foods" (09/25/2012)  . Gynecomastia, male 07/10/2012  . History of blood transfusion    "always related to sickle cell crisis" (09/25/2012)  . 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   Noted in noted from Hematology notes at [facility]  . Migraines    "take RX qd to prevent them" (09/25/2012)  . Opioid dependence with withdrawal ([facility]) 08/30/2012  . Renal insufficiency   . Sickle cell anemia (HCC)   . Sickle cell crisis ([place]) 09/25/2012  . Sickle cell nephropathy ([place]) 07/10/2012  . Sinus tachycardia   .  Tachycardia with heart rate 121-140 beats per minute with ambulation 08/04/2016      Past Surgical History:  Procedure Laterality Date  . ABCESS DRAINAGE    . CHOLECYSTECTOMY  ~ 2012  . COLONOSCOPY N/A 04/23/2017   Procedure: COLONOSCOPY;  Surgeon: Irene Shipper, MD;  Location: [facility];  Service: Endoscopy;  Laterality: N/A;  . IR FLUORO GUIDE CV LINE RIGHT  12/17/2016  . IR RADIOLOGIST EVAL  & MGMT  10/02/2018  . IR RADIOLOGIST EVAL & MGMT  10/15/2018  . IR REMOVAL TUN CV CATH W/O FL  12/21/2016  . IR US GUIDE VASC ACCESS RIGHT  12/17/2016  . spleenectomy       Social History:   Social History   Tobacco Use  . Smoking status: Current Some Day Smoker    Packs/day: 0.10    Types: Cigarettes  . Smokeless tobacco: Never Used  . Tobacco comment: 09/25/2012 "I don't buy cigarettes; bum one from friends q now and then"  Substance Use Topics  . Alcohol use: Yes    Alcohol/week: 0.0 standard drinks    Comment: Once a month     Lives - At home   Family History :   Family History  Problem Relation Age of Onset  . Breast cancer Mother      Home Medications:   Prior to Admission medications   Medication Sig Start Date End Date Taking? Authorizing Provider  acetaminophen (TYLENOL) 500 MG tablet Take 1 tablet (500 mg total) by mouth every 6 (six) hours as needed. Patient taking differently: Take 500 mg by mouth every 6 (six) hours as needed for mild pain. 03/26/19  Yes [name], FNP  allopurinol (ZYLOPRIM) 100 MG tablet Take 100 mg by mouth daily. 02/12/20  Yes [provider]  Cholecalciferol (VITAMIN D3) 50 MCG (2000 UT) TABS Take 1 each by mouth daily. Patient taking differently: Take 2,000 Units by mouth daily. 01/13/20  Yes [name], FNP  folic acid (FOLVITE) 1 MG tablet TAKE 1 TABLET BY MOUTH EVERY DAY Patient taking differently: Take 1 mg by mouth daily. 03/22/20  Yes [name], FNP  nortriptyline (PAMELOR) 25 MG capsule TAKE 1 CAPSULE BY MOUTH 2 TIMES DAILY. Patient taking differently: Take 25 mg by mouth 2 (two) times daily. 02/03/20  Yes [name], FNP  Oxycodone HCl 10 MG TABS Take 1 tablet (10 mg total) by mouth every 6 (six) hours as needed. 03/16/20  Yes [name], FNP  sodium bicarbonate 650 MG tablet Take 650 mg by mouth 2 (two) times daily. 11/07/18  Yes [provider]  oxyCODONE (OXYCONTIN) 15 mg 12 hr  tablet Take 1 tablet (15 mg total) by mouth every 12 (twelve) hours. 03/02/20   [name], FNP     Allergies:   Allergies  Allergen Reactions  . Nsaids Other (See Comments)    Kidney disease  . Morphine And Related Other (See Comments)    "Real bad headaches"      Physical Exam:   Vitals:   Vitals:   03/22/20 1400 03/22/20 1430  BP: 102/76 107/86  Pulse: 94 100  Resp: 18 15  Temp:    SpO2: 96% 99%    Physical Exam: Constitutional: Patient appears well-developed and well-nourished. Not in obvious distress. HENT: Normocephalic, atraumatic, External right and left ear normal. Oropharynx is clear and moist.  Eyes: Conjunctivae and EOM are normal. PERRLA, no scleral icterus. Neck: Normal ROM. Neck supple. No JVD. No  tracheal deviation. No thyromegaly. CVS: RRR, S1/S2 +, no murmurs, no gallops, no carotid bruit.  Pulmonary: Effort and breath sounds normal, no stridor, rhonchi, wheezes, rales.  Abdominal: Soft. BS +, no distension, tenderness, rebound or guarding.  Musculoskeletal: Normal range of motion. No edema and no tenderness.  Lymphadenopathy: No lymphadenopathy noted, cervical, inguinal or axillary Neuro: Alert. Normal reflexes, muscle tone coordination. No cranial nerve deficit. Skin: Skin is warm and dry. No rash noted. Not diaphoretic. No erythema. No pallor. Psychiatric: Normal mood and affect. Behavior, judgment, thought content normal.   Data Review:   CBC Recent Labs  Lab 03/22/20 0923  WBC 19.2*  HGB 6.4*  HCT 17.4*  PLT 346  MCV 110.8*  MCH 40.1*  MCHC 36.2*  RDW Not Measured  LYMPHSABS 6.0*  MONOABS 1.7*  EOSABS 0.4  BASOSABS 0.0   ------------------------------------------------------------------------------------------------------------------  Chemistries  Recent Labs  Lab 03/22/20 0923  NA 136  K 4.3  CL 105  CO2 21*  GLUCOSE 100*  BUN 44*  CREATININE 3.01*  CALCIUM 8.6*  AST 35  ALT 21  ALKPHOS 248*  BILITOT 1.8*    ------------------------------------------------------------------------------------------------------------------ estimated creatinine clearance is 27 mL/min (A) (by C-G formula based on SCr of 3.01 mg/dL (H)). ------------------------------------------------------------------------------------------------------------------ No results for input(s): TSH, T4TOTAL, T3FREE, THYROIDAB in the last 72 hours.  Invalid input(s): FREET3  Coagulation profile No results for input(s): INR, PROTIME in the last 168 hours. ------------------------------------------------------------------------------------------------------------------- No results for input(s): DDIMER in the last 72 hours. -------------------------------------------------------------------------------------------------------------------  Cardiac Enzymes No results for input(s): CKMB, TROPONINI, MYOGLOBIN in the last 168 hours.  Invalid input(s): CK ------------------------------------------------------------------------------------------------------------------ No results found for: BNP  ---------------------------------------------------------------------------------------------------------------  Urinalysis    Component Value Date/Time   COLORURINE YELLOW 02/26/2020 [place] 02/26/2020 1431   LABSPEC 1.009 02/26/2020 1431   PHURINE 8.0 02/26/2020 1431   GLUCOSEU 50 (A) 02/26/2020 1431   HGBUR NEGATIVE 02/26/2020 1431   HGBUR small 03/10/2010 1445   BILIRUBINUR neg 03/16/2020 0905   KETONESUR NEGATIVE 02/26/2020 1431   PROTEINUR Positive (A) 03/16/2020 0905   PROTEINUR 100 (A) 02/26/2020 1431   UROBILINOGEN 0.2 03/16/2020 0905   UROBILINOGEN 0.2 05/11/2017 1335   NITRITE neg 03/16/2020 0905   NITRITE NEGATIVE 02/26/2020 1431   LEUKOCYTESUR Negative 03/16/2020 0905   LEUKOCYTESUR NEGATIVE 02/26/2020 1431     ----------------------------------------------------------------------------------------------------------------   Imaging Results:    No results found.   Assessment & Plan:  Principal Problem:   Sickle cell pain crisis (HCC) Active Problems:   Migraine   CKD (chronic kidney disease), stage IV (HCC)   Leukocytosis   Symptomatic anemia   Chronic pain syndrome  Sickle cell pain crisis;  Admit to med surge, continue IV fluids D5.5% saline at 75 ml/hr.  Initiate IV dilaudid PCA with settings of 0.5 mg, 10 minute lockout, and 3 mg/hr Continue home medications including Oxycontin 15 mg every 12 hours.  Hold IV Toradol due to chronic kidney disease.  Monitor vital signs closely, reevaluate pain scale regularly, and supplemental oxygen as needed Patient will be reevaluated for pain in the context of function and relationship to baseline as care progresses.  Leukocytosis: WBCs 19.2. More than likely reactive. Patient is afebrile without any signs of infection or inflammation. Continue to monitor closely without antibiotics.   Chronic pain syndrome: Continue Oxycontin 15 mg every 12 hours Hold oxycodone, use PCA dilaudid  Acute kidney injury superimposed on chronic kidney disease stage IV:  Creatine elevated above baseline at 3.1.  Gentle hydration  and avoid all nephrotoxins.  Patient is followed by nephrology as an outpatient  Sickle cell anemia:  Hemoglobin 6.4. Patient received 1 unit PRBCs in ER. Will continue to follow closely. Repeat CBC in am  Chronic migraine headaches:  Continue Pamelor 25 mg twice daily Patient endorses some dizziness and blurred vision. Review head CT as results become available.   Tobacco dependence: Smoking cessation instruction/counseling given:  counseled patient on the dangers of tobacco use, advised patient to stop smoking, and reviewed strategies to maximize success  DVT Prophylaxis: SCDs  AM Labs Ordered, also please review Full  Orders  Family Communication: Admission, patient's condition and plan of care including tests being ordered have been discussed with the patient who indicate understanding and agree with the plan and Code Status.  Code Status: Full Code  Consults called: None    Admission status: Inpatient    Time spent in minutes : 35 minutes  [place], MSN, FNP-C [facility] [address] [place], [place] [address] [phone]  03/22/2020 at 3:39 PM

## 2020-03-22 NOTE — Telephone Encounter (Signed)
Patient called, requesting to come to the day hospital due to pain in the legs, hands, back and "headache since Friday" rated at 10/10. Denied chest pain, fever, diarrhea, abdominal pain, nausea/vomitting and priapism. Screened negative for Covid-19 symptoms. Admitted to having means of transportation without driving self after treatment. Last took 10 mg of oxycontin, 2 tablets of OTC Tylenol at 07:30 am today. Per provider, in view of the headache since Friday, patient told to go the ER to rule out other acute conditions. Patient notified, verbalized understanding.

## 2020-03-22 NOTE — ED Notes (Signed)
Pt IV pulled out by accident. This RN attempted to start another IV. Unable to do so. RN Ronalee Belts agreed to do ultrasounds IV once available.

## 2020-03-22 NOTE — ED Triage Notes (Signed)
Patient c/o migraine x 3-4 days. Patient states his "migraine medicine ain't working."  Patient c/o sickle cell pain of bilateral legs and wrist pain x 5 days.
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
## 2020-03-23 ENCOUNTER — Inpatient Hospital Stay (HOSPITAL_COMMUNITY): Payer: 59

## 2020-03-23 DIAGNOSIS — D5700 Hb-SS disease with crisis, unspecified: Secondary | ICD-10-CM | POA: Diagnosis not present

## 2020-03-23 LAB — BASIC METABOLIC PANEL
Anion gap: 10 (ref 5–15)
BUN: 33 mg/dL — ABNORMAL HIGH (ref 6–20)
CO2: 21 mmol/L — ABNORMAL LOW (ref 22–32)
Calcium: 8.7 mg/dL — ABNORMAL LOW (ref 8.9–10.3)
Chloride: 103 mmol/L (ref 98–111)
Creatinine, Ser: 2.54 mg/dL — ABNORMAL HIGH (ref 0.61–1.24)
GFR, Estimated: 32 mL/min — ABNORMAL LOW (ref >60)
Glucose, Bld: 109 mg/dL — ABNORMAL HIGH (ref 70–99)
Potassium: 4.8 mmol/L (ref 3.5–5.1)
Sodium: 134 mmol/L — ABNORMAL LOW (ref 135–145)

## 2020-03-23 LAB — TYPE AND SCREEN
ABO/RH(D): O POS
Antibody Screen: NEGATIVE
Donor AG Type: NEGATIVE
Unit division: 0

## 2020-03-23 LAB — CBC
HCT: 20 % — ABNORMAL LOW (ref 39.0–52.0)
Hemoglobin: 7.4 g/dL — ABNORMAL LOW (ref 13.0–17.0)
MCH: 38.3 pg — ABNORMAL HIGH (ref 26.0–34.0)
MCHC: 37 g/dL — ABNORMAL HIGH (ref 30.0–36.0)
MCV: 103.6 fL — ABNORMAL HIGH (ref 80.0–100.0)
Platelets: 315 10*3/uL (ref 150–400)
RBC: 1.93 MIL/uL — ABNORMAL LOW (ref 4.22–5.81)
WBC: 16.5 10*3/uL — ABNORMAL HIGH (ref 4.0–10.5)
nRBC: 0.3 % — ABNORMAL HIGH (ref 0.0–0.2)

## 2020-03-23 LAB — BPAM RBC
Blood Product Expiration Date: 202203122359
ISSUE DATE / TIME: 202202071243
Unit Type and Rh: 9500

## 2020-03-23 MED ORDER — AMOXICILLIN-POT CLAVULANATE 875-125 MG PO TABS
1.0000 | ORAL_TABLET | Freq: Two times a day (BID) | ORAL | Status: DC
  Administered 2020-03-23 – 2020-03-25 (×5): 1 via ORAL
  Filled 2020-03-23 (×5): qty 1

## 2020-03-23 MED ORDER — ACETAMINOPHEN 325 MG PO TABS
650.0000 mg | ORAL_TABLET | ORAL | Status: DC | PRN
  Administered 2020-03-23 – 2020-03-24 (×4): 650 mg via ORAL
  Filled 2020-03-23 (×4): qty 2

## 2020-03-23 MED ORDER — DARBEPOETIN ALFA 200 MCG/0.4ML IJ SOSY
200.0000 ug | PREFILLED_SYRINGE | Freq: Once | INTRAMUSCULAR | Status: AC
  Administered 2020-03-23: 200 ug via SUBCUTANEOUS
  Filled 2020-03-23: qty 0.4

## 2020-03-23 MED ORDER — FLUTICASONE PROPIONATE 50 MCG/ACT NA SUSP
2.0000 | Freq: Every day | NASAL | Status: DC
  Administered 2020-03-23 – 2020-03-25 (×3): 2 via NASAL
  Filled 2020-03-23: qty 16

## 2020-03-23 NOTE — Progress Notes (Signed)
Subjective: [name] [name] is a 40 year old male with a medical history significant for sickle cell anemia type SS, chronic kidney disease stage IV, history of symptomatic anemia, opiate dependence and tolerance, and history of polysubstance abuse was admitted for sickle cell pain crisis.  Today, patient continues to complain of frontal headache and nasal congestion. He rates headache as 9/10. Also, patient is having pain to low back and lower extremities that is consistent with his sickle cell pain crisis. Patient underwent CT of the head on last night which showed stable CFS collections in the temporal regions bilaterally, left larger than right some atrophy, no acute intracranial abnormalities, and diffuse acute on chronic sinusitis.  Patient denies blurred vision, dizziness, chest pain, shortness of breath, urinary symptoms, nausea, vomiting, or diarrhea.  Objective:  Vital signs in last 24 hours:  Vitals:   03/23/20 0447 03/23/20 0732 03/23/20 1050 03/23/20 1221  BP: 107/70  105/70   Pulse: 91  85   Resp: '16 14 15 18  '$ Temp: 98.1 F (36.7 C)  97.8 F (36.6 C)   TempSrc: Oral  Oral   SpO2: 100% 100% 99% 100%  Weight:      Height:        Intake/Output from previous day:   Intake/Output Summary (Last 24 hours) at 03/23/2020 1324 Last data filed at 03/23/2020 1053 Gross per 24 hour  Intake 1795.12 ml  Output 2200 ml  Net -404.88 ml    Physical Exam: General: Alert, awake, oriented x3, in no acute distress.  HEENT: [place]/AT PEERL, EOMI Neck: Trachea midline,  no masses, no thyromegal,y no JVD, no carotid bruit OROPHARYNX:  Moist, No exudate/ erythema/lesions.  Heart: Regular rate and rhythm, without murmurs, rubs, gallops, PMI non-displaced, no heaves or thrills on palpation.  Lungs: Clear to auscultation, no wheezing or rhonchi noted. No increased vocal fremitus resonant to percussion  Abdomen: Soft, nontender, nondistended, positive bowel sounds, no masses no hepatosplenomegaly  noted..  Neuro: No focal neurological deficits noted cranial nerves II through XII grossly intact. DTRs 2+ bilaterally upper and lower extremities. Strength 5 out of 5 in bilateral upper and lower extremities. Musculoskeletal: No warm swelling or erythema around joints, no spinal tenderness noted. Psychiatric: Patient alert and oriented x3, good insight and cognition, good recent to remote recall. Lymph node survey: No cervical axillary or inguinal lymphadenopathy noted.  Lab Results:  Basic Metabolic Panel:    Component Value Date/Time   NA 134 (L) 03/23/2020 0542   NA 136 10/14/2019 1044   K 4.8 03/23/2020 0542   CL 103 03/23/2020 0542   CO2 21 (L) 03/23/2020 0542   BUN 33 (H) 03/23/2020 0542   BUN 38 (H) 10/14/2019 1044   CREATININE 2.54 (H) 03/23/2020 0542   CREATININE 1.45 (H) 05/12/2014 1430   GLUCOSE 109 (H) 03/23/2020 0542   CALCIUM 8.7 (L) 03/23/2020 0542   CBC:    Component Value Date/Time   WBC 16.5 (H) 03/23/2020 0542   HGB 7.4 (L) 03/23/2020 0542   HGB 6.7 (LL) 10/14/2019 1044   HCT 20.0 (L) 03/23/2020 0542   HCT 20.1 (L) 10/14/2019 1044   PLT 315 03/23/2020 0542   PLT 317 10/14/2019 1044   MCV 103.6 (H) 03/23/2020 0542   MCV 91 10/14/2019 1044   NEUTROABS 11.1 (H) 03/22/2020 0923   NEUTROABS 7.1 (H) 10/14/2019 1044   LYMPHSABS 6.0 (H) 03/22/2020 0923   LYMPHSABS 6.9 (H) 10/14/2019 1044   MONOABS 1.7 (H) 03/22/2020 0923   EOSABS 0.4 03/22/2020 [id number]  EOSABS 0.4 10/14/2019 1044   BASOSABS 0.0 03/22/2020 0923   BASOSABS 0.0 10/14/2019 1044    No results found for this or any previous visit (from the past 240 hour(s)).  Studies/Results: CT HEAD WO CONTRAST  Result Date: 03/23/2020 CLINICAL DATA:  Headache EXAM: CT HEAD WITHOUT CONTRAST TECHNIQUE: Contiguous axial images were obtained from the base of the skull through the vertex without intravenous contrast. COMPARISON:  01/06/2019 FINDINGS: Brain: Large CSF collections seen in the temporal regions  bilaterally, left greater than right, stable since prior study. Diffuse cerebral atrophy, advanced for patient's age. No acute infarction, hemorrhage or hydrocephalus. Vascular: No hyperdense vessel or unexpected calcification. Skull: No acute calvarial abnormality. Sinuses/Orbits: Air-fluid levels throughout the paranasal sinuses diffusely. Mastoid air cells clear. Other: None IMPRESSION: Stable CSF collections in the temporal regions bilaterally, left larger than right. Age advanced atrophy. No acute intracranial abnormality. Diffuse acute on chronic sinusitis. Electronically Signed   By: Rolm Baptise M.D.   On: 03/23/2020 01:15    Medications: Scheduled Meds: . allopurinol  100 mg Oral Daily  . amoxicillin-clavulanate  1 tablet Oral Q12H  . cholecalciferol  2,000 Units Oral Daily  . darbepoetin (ARANESP) injection - NON-DIALYSIS  200 mcg Subcutaneous Once  . fluticasone  2 spray Each Nare Daily  . folic acid  1 mg Oral Daily  . HYDROmorphone   Intravenous Q4H  . nortriptyline  25 mg Oral BID  . oxyCODONE  15 mg Oral Q12H  . senna-docusate  1 tablet Oral BID  . sodium bicarbonate  650 mg Oral BID   Continuous Infusions: . sodium chloride Stopped (03/22/20 1713)  . dextrose 5 % and 0.45% NaCl 75 mL/hr at 03/23/20 0415   PRN Meds:.diphenhydrAMINE, naloxone **AND** sodium chloride flush, ondansetron, oxyCODONE, polyethylene glycol  Consultants:  None  Procedures:  None  Antibiotics:  None  Assessment/Plan: Principal Problem:   Sickle cell pain crisis ([place]) Active Problems:   Migraine   CKD (chronic kidney disease), stage IV (HCC)   Leukocytosis   Symptomatic anemia   Chronic pain syndrome  Sickle cell disease with pain crisis: Continue IV Dilaudid PCA without any changes in settings on today Decrease IV fluids to 50 mL/h, 0.45% saline Continue to hold IV Toradol due to chronic kidney disease Monitor vital signs very closely, reevaluate pain scale regularly, and  supplemental oxygen as needed  Acute sinusitis: Head CT showed diffuse acute on chronic sinusitis Augmentin 875-125 mg every 12 hours Tylenol 650 mg every 6 hours as needed for headache  Chronic pain syndrome: Continue home medications   Acute kidney injury superimposed on chronic kidney disease stage IV: Improving.  Creatinine 2.54 today.  Continue gentle hydration.  Aranesp 200 mcg today.  Follow labs in a.m.  Sickle cell anemia: Today hemoglobin 7.4.  Patient is s/p 1 unit PRBCs on 03/22/2020.  Aranesp 200 mcg today.  CBC in a.m.  Chronic migraine headaches: Continue Pamelor 25 mg twice daily  Tobacco dependence: Smoking cessation instruction/counseling given:  counseled patient on the dangers of tobacco use, advised patient to stop smoking, and reviewed strategies to maximize success   Code Status: Full Code Family Communication: N/A Disposition Plan: Not yet ready for discharge   [place], MSN, FNP-C Patient [place], [place] [address] [phone]  If 5PM-8AM, please contact night-coverage.  03/23/2020, 1:24 PM  LOS: 1 day

## 2020-03-24 DIAGNOSIS — D5700 Hb-SS disease with crisis, unspecified: Secondary | ICD-10-CM | POA: Diagnosis not present

## 2020-03-24 LAB — BASIC METABOLIC PANEL
Anion gap: 10 (ref 5–15)
BUN: 25 mg/dL — ABNORMAL HIGH (ref 6–20)
CO2: 19 mmol/L — ABNORMAL LOW (ref 22–32)
Calcium: 8.8 mg/dL — ABNORMAL LOW (ref 8.9–10.3)
Chloride: 107 mmol/L (ref 98–111)
Creatinine, Ser: 2.08 mg/dL — ABNORMAL HIGH (ref 0.61–1.24)
GFR, Estimated: 41 mL/min — ABNORMAL LOW (ref >60)
Glucose, Bld: 119 mg/dL — ABNORMAL HIGH (ref 70–99)
Potassium: 6.2 mmol/L — ABNORMAL HIGH (ref 3.5–5.1)
Sodium: 136 mmol/L (ref 135–145)

## 2020-03-24 LAB — CBC
HCT: 21.3 % — ABNORMAL LOW (ref 39.0–52.0)
Hemoglobin: 8.3 g/dL — ABNORMAL LOW (ref 13.0–17.0)
MCH: 41.3 pg — ABNORMAL HIGH (ref 26.0–34.0)
MCHC: 39 g/dL — ABNORMAL HIGH (ref 30.0–36.0)
MCV: 106 fL — ABNORMAL HIGH (ref 80.0–100.0)
Platelets: 318 10*3/uL (ref 150–400)
RBC: 2.01 MIL/uL — ABNORMAL LOW (ref 4.22–5.81)
RDW: 28.8 % — ABNORMAL HIGH (ref 11.5–15.5)
WBC: 13.8 10*3/uL — ABNORMAL HIGH (ref 4.0–10.5)
nRBC: 0.4 % — ABNORMAL HIGH (ref 0.0–0.2)

## 2020-03-24 MED ORDER — PROSOURCE PLUS PO LIQD
30.0000 mL | Freq: Two times a day (BID) | ORAL | Status: DC
  Administered 2020-03-25: 30 mL via ORAL
  Filled 2020-03-24: qty 30

## 2020-03-24 MED ORDER — ADULT MULTIVITAMIN W/MINERALS CH
1.0000 | ORAL_TABLET | Freq: Every day | ORAL | Status: DC
  Administered 2020-03-25: 1 via ORAL
  Filled 2020-03-24: qty 1

## 2020-03-24 NOTE — Progress Notes (Signed)
Subjective: [name] [name] is a 40 year old male with a medical history significant for sickle cell anemia type SS, chronic kidney disease stage IV, history of symptomatic anemia, opiate dependence and tolerance, and history of polysubstance abuse was admitted for sickle cell pain crisis.  Today, patient states that he feels much better than yesterday.  His headache is improved.  He continues to have low back and lower extremity pains rated at 6/10. He currently denies any blurred vision, dizziness, chest pain, shortness of breath, urinary symptoms, nausea, vomiting, or diarrhea.  Objective:  Vital signs in last 24 hours:  Vitals:   03/24/20 0808 03/24/20 1008 03/24/20 1453 03/24/20 1510  BP:  108/71  116/84  Pulse:  90  87  Resp: '16 16 11 16  '$ Temp:  98.5 F (36.9 C)  97.9 F (36.6 C)  TempSrc:  Oral  Oral  SpO2: 97% 99% 100% 100%  Weight:      Height:        Intake/Output from previous day:   Intake/Output Summary (Last 24 hours) at 03/24/2020 1703 Last data filed at 03/24/2020 1009 Gross per 24 hour  Intake 760.78 ml  Output 2600 ml  Net -1839.22 ml    Physical Exam: General: Alert, awake, oriented x3, in no acute distress.  HEENT: [place]/AT PEERL, EOMI Neck: Trachea midline,  no masses, no thyromegal,y no JVD, no carotid bruit OROPHARYNX:  Moist, No exudate/ erythema/lesions.  Heart: Regular rate and rhythm, without murmurs, rubs, gallops, PMI non-displaced, no heaves or thrills on palpation.  Lungs: Clear to auscultation, no wheezing or rhonchi noted. No increased vocal fremitus resonant to percussion  Abdomen: Soft, nontender, nondistended, positive bowel sounds, no masses no hepatosplenomegaly noted..  Neuro: No focal neurological deficits noted cranial nerves II through XII grossly intact. DTRs 2+ bilaterally upper and lower extremities. Strength 5 out of 5 in bilateral upper and lower extremities. Musculoskeletal: No warm swelling or erythema around joints, no spinal  tenderness noted. Psychiatric: Patient alert and oriented x3, good insight and cognition, good recent to remote recall. Lymph node survey: No cervical axillary or inguinal lymphadenopathy noted.  Lab Results:  Basic Metabolic Panel:    Component Value Date/Time   NA 136 03/24/2020 0605   NA 136 10/14/2019 1044   K 6.2 (H) 03/24/2020 0605   CL 107 03/24/2020 0605   CO2 19 (L) 03/24/2020 0605   BUN 25 (H) 03/24/2020 0605   BUN 38 (H) 10/14/2019 1044   CREATININE 2.08 (H) 03/24/2020 0605   CREATININE 1.45 (H) 05/12/2014 1430   GLUCOSE 119 (H) 03/24/2020 0605   CALCIUM 8.8 (L) 03/24/2020 0605   CBC:    Component Value Date/Time   WBC 13.8 (H) 03/24/2020 0605   HGB 8.3 (L) 03/24/2020 0605   HGB 6.7 (LL) 10/14/2019 1044   HCT 21.3 (L) 03/24/2020 0605   HCT 20.1 (L) 10/14/2019 1044   PLT 318 03/24/2020 0605   PLT 317 10/14/2019 1044   MCV 106.0 (H) 03/24/2020 0605   MCV 91 10/14/2019 1044   NEUTROABS 11.1 (H) 03/22/2020 0923   NEUTROABS 7.1 (H) 10/14/2019 1044   LYMPHSABS 6.0 (H) 03/22/2020 0923   LYMPHSABS 6.9 (H) 10/14/2019 1044   MONOABS 1.7 (H) 03/22/2020 0923   EOSABS 0.4 03/22/2020 0923   EOSABS 0.4 10/14/2019 1044   BASOSABS 0.0 03/22/2020 0923   BASOSABS 0.0 10/14/2019 1044    No results found for this or any previous visit (from the past 240 hour(s)).  Studies/Results: CT HEAD WO CONTRAST  Result Date: 03/23/2020 CLINICAL DATA:  Headache EXAM: CT HEAD WITHOUT CONTRAST TECHNIQUE: Contiguous axial images were obtained from the base of the skull through the vertex without intravenous contrast. COMPARISON:  01/06/2019 FINDINGS: Brain: Large CSF collections seen in the temporal regions bilaterally, left greater than right, stable since prior study. Diffuse cerebral atrophy, advanced for patient's age. No acute infarction, hemorrhage or hydrocephalus. Vascular: No hyperdense vessel or unexpected calcification. Skull: No acute calvarial abnormality. Sinuses/Orbits:  Air-fluid levels throughout the paranasal sinuses diffusely. Mastoid air cells clear. Other: None IMPRESSION: Stable CSF collections in the temporal regions bilaterally, left larger than right. Age advanced atrophy. No acute intracranial abnormality. Diffuse acute on chronic sinusitis. Electronically Signed   By: Rolm Baptise M.D.   On: 03/23/2020 01:15    Medications: Scheduled Meds: . allopurinol  100 mg Oral Daily  . amoxicillin-clavulanate  1 tablet Oral Q12H  . cholecalciferol  2,000 Units Oral Daily  . fluticasone  2 spray Each Nare Daily  . folic acid  1 mg Oral Daily  . HYDROmorphone   Intravenous Q4H  . nortriptyline  25 mg Oral BID  . oxyCODONE  15 mg Oral Q12H  . senna-docusate  1 tablet Oral BID  . sodium bicarbonate  650 mg Oral BID   Continuous Infusions: . sodium chloride Stopped (03/22/20 1713)  . dextrose 5 % and 0.45% NaCl 50 mL/hr at 03/24/20 1223   PRN Meds:.acetaminophen, diphenhydrAMINE, naloxone **AND** sodium chloride flush, ondansetron, oxyCODONE, polyethylene glycol  Consultants:  None  Procedures:  None  Antibiotics:  Augmentin  Assessment/Plan: Principal Problem:   Sickle cell pain crisis ([place]) Active Problems:   Migraine   CKD (chronic kidney disease), stage IV (HCC)   Leukocytosis   Symptomatic anemia   Chronic pain syndrome  Sickle cell disease with pain crisis: Continue IV Dilaudid PCA without any changes in settings today Continue IV fluids, 0.45% saline at 50 mL/h Oxycodone 10 mg every 6 hours as needed Monitor vital signs closely, reevaluate pain scale regularly and supplemental oxygen as needed.   Acute sinusitis: Continue Augmentin 875-125 mg every 12 hours for total of 10 days Tylenol 650 mg every 6 hours as needed for sinus headache  Chronic pain syndrome: Continue home medications  Acute kidney injury superimposed on chronic kidney disease stage IV: Continues to improve.  Creatinine decreased to 2.08 today.  Continue  gentle hydration.  Patient is s/p Aranesp on yesterday.  Follow labs closely.  Sickle cell anemia: Hemoglobin is improved and is slightly above baseline.  Patient is status post 1 unit PRBCs on 03/22/2020.  Follow CBC in a.m.  Chronic migraine headaches: Continue Pamelor 25 mg twice daily  Tobacco dependence: Smoking cessation instruction/counseling given:  counseled patient on the dangers of tobacco use, advised patient to stop smoking, and reviewed strategies to maximize success   Code Status: Full Code Family Communication: N/A Disposition Plan: Not yet ready for discharge  [place], MSN, FNP-C [facility] [place], [place] [address] [phone]  If 5PM-8AM, please contact night-coverage.  03/24/2020, 5:03 PM  LOS: 2 days

## 2020-03-24 NOTE — Progress Notes (Signed)
Initial Nutrition Assessment  DOCUMENTATION CODES:   Not applicable  INTERVENTION:   Magic cup TID with meals, each supplement provides 290 kcal and 9 grams of protein  66m Prosource Plus po BID, each supplement provides 100 kcals and 15 grams of protein  MVI with minerals daily   NUTRITION DIAGNOSIS:   Increased nutrient needs related to acute illness (sickle cell crisis) as evidenced by estimated needs.    GOAL:   Patient will meet greater than or equal to 90% of their needs    MONITOR:   PO intake,Supplement acceptance,Weight trends,Labs,I & O's  REASON FOR ASSESSMENT:   Malnutrition Screening Tool    ASSESSMENT:   Pt admitted with sickle cell pain crisis. PMH includes sickle cell anemia type SS, CKD stage IV, h/o symptomatic anemia, opiate dependence, and h/o polysubstance abuse   Pt denies N/V/D. No meal completion records currently available to assess. Per previous RD notes, pt typically with poor appetite when experiencing sickle cell crisis. He does not accept Ensure Enlive supplements well historically.   Reviewed weight history. No significant wt changes noted.   UOP: [id number] hours  Medications: vitamin D, folvite, senokot-s, sodium bicarbonate Labs: K+ 6.2 (H)    NUTRITION - FOCUSED PHYSICAL EXAM: Unable to perform at this time as RD is working remotely from another campus. Will attempt at follow-up.   Diet Order:   Diet Order            Diet regular Room service appropriate? Yes  Diet effective now                 EDUCATION NEEDS:   No education needs have been identified at this time  Skin:  Skin Assessment: Reviewed RN Assessment  Last BM:  2/6  Height:   Ht Readings from Last 1 Encounters:  03/22/20 '5\' 3"'$  (1.6 m)    Weight:   Wt Readings from Last 1 Encounters:  03/22/20 59 kg   BMI:  Body mass index is 23.03 kg/m.  Estimated Nutritional Needs:   Kcal:  1800-2000  Protein:  90-100 grams  Fluid:   >1.8L    AmLarkin InaMS, RD, LDN RD pager number and weekend/on-call pager number located in [facility]

## 2020-03-25 ENCOUNTER — Other Ambulatory Visit (HOSPITAL_COMMUNITY): Payer: Self-pay | Admitting: Family Medicine

## 2020-03-25 LAB — CBC
HCT: 22.5 % — ABNORMAL LOW (ref 39.0–52.0)
Hemoglobin: 7.7 g/dL — ABNORMAL LOW (ref 13.0–17.0)
MCH: 37.4 pg — ABNORMAL HIGH (ref 26.0–34.0)
MCHC: 34.2 g/dL (ref 30.0–36.0)
MCV: 109.2 fL — ABNORMAL HIGH (ref 80.0–100.0)
Platelets: 314 10*3/uL (ref 150–400)
RBC: 2.06 MIL/uL — ABNORMAL LOW (ref 4.22–5.81)
RDW: 27.8 % — ABNORMAL HIGH (ref 11.5–15.5)
WBC: 13 10*3/uL — ABNORMAL HIGH (ref 4.0–10.5)
nRBC: 0.4 % — ABNORMAL HIGH (ref 0.0–0.2)

## 2020-03-25 LAB — BASIC METABOLIC PANEL
Anion gap: 8 (ref 5–15)
BUN: 26 mg/dL — ABNORMAL HIGH (ref 6–20)
CO2: 22 mmol/L (ref 22–32)
Calcium: 8.7 mg/dL — ABNORMAL LOW (ref 8.9–10.3)
Chloride: 106 mmol/L (ref 98–111)
Creatinine, Ser: 2.35 mg/dL — ABNORMAL HIGH (ref 0.61–1.24)
GFR, Estimated: 36 mL/min — ABNORMAL LOW (ref >60)
Glucose, Bld: 97 mg/dL (ref 70–99)
Potassium: 5.9 mmol/L — ABNORMAL HIGH (ref 3.5–5.1)
Sodium: 136 mmol/L (ref 135–145)

## 2020-03-25 MED ORDER — AMOXICILLIN-POT CLAVULANATE 875-125 MG PO TABS: 1.0000 | ORAL_TABLET | Freq: Two times a day (BID) | ORAL | 0 refills | Status: DC

## 2020-03-25 MED FILL — AMOX TR-K CLV 875-125 MG TA: 875-125 | 8 days supply | Qty: 16 | Fill #0

## 2020-03-25 NOTE — Discharge Summary (Signed)
Physician Discharge Summary  [name] [name]. [id number] DOB: [birth year] DOA: 03/22/2020  PCP: [name], FNP  Admit date: 03/22/2020  Discharge date: 03/27/2020  Discharge Diagnoses:  Principal Problem:   Sickle cell pain crisis ([facility]) Active Problems:   Migraine   CKD (chronic kidney disease), stage IV (HCC)   Leukocytosis   Symptomatic anemia   Chronic pain syndrome   Discharge Condition: Stable  Disposition:  Pt is discharged home in good condition and is to follow up with [name], FNP this week to have labs evaluated. [name] A Arguijo Jr. is instructed to increase activity slowly and balance with rest for the next few days, and use prescribed medication to complete treatment of pain  Diet: Regular Wt Readings from Last 3 Encounters:  03/22/20 59 kg  03/16/20 58.1 kg  02/27/20 57.4 kg    History of present illness:   [name] [name]  is a 40 y.o. male with a medical history significant for sickle cell anemia type SS, chronic kidney disease stage IV, history of symptomatic anemia, opiate dependence and tolerance, and history of polysubstance abuse presented to the emergency department complaining of generalized pain that is consistent with his typical pain crisis.  Also, patient reports that he has had a headache for the past 3 days that has been unrelieved by his home medications.  Patient has been taking oxycodone and OxyContin as prescribed without sustained relief.  Currently pain intensity is 8/10 primarily to low back, lower extremities.  Also, patient continues to complain a headache with some blurred vision and dizziness.  He denies any fever, chills, persistent cough, chest pain, shortness of breath, urinary symptoms, nausea, vomiting, or diarrhea.  Patient has had no sick contacts, recent travel, or exposure to COVID-19.  He has been fully vaccinated against COVID-19.  Emergency room course: While in the emergency room, patient found to have  a hemoglobin of 6.4 which is consistent with his baseline.  However, patient was transfused 1 unit PRBCs.  WBCs elevated at 19.2.  Chest x-ray shows no acute cardiopulmonary process.  Creatinine 3.01, increased from patient's baseline 2.5-2.8.  Patient is afebrile.  Oxygen saturation is 100% on RA.  All other vital signs unremarkable.  Patient's pain persists despite IV Dilaudid and IV fluids.  Sickle cell team notified for admission.  Patient admitted for further management of sickle cell pain crisis Hospital Course:  Sickle cell disease with pain crisis: Patient was admitted for sickle cell pain crisis and managed appropriately with IVF, IV Dilaudid via PCA a as well as other adjunct therapies per sickle cell pain management protocols..  IV Dilaudid weaned appropriately and patient transition to home medications.  Patient will continue OxyContin and oxycodone as previously prescribed.  Pain intensity decreased to 4/10.  Patient is requesting discharge home.  Follow-up with PCP for medication management.  Sickle cell anemia: Hemoglobin 6.4 on admission, patient transfused 1 unit PRBCs.  Prior to discharge, hemoglobin increased to 7.7.  Patient will follow-up in 1 week for CBC and CMP.  Acute kidney injury superimposed on CKD stage IV: On admission, creatinine 3.01, which is above patient's baseline.  Gentle hydration throughout admission.  Also, Aranesp 200 mcg x 1.  Improved.  Creatinine 2.35 prior to discharge.  Patient advised to continue following up with outpatient nephrology as scheduled.  Acute sinusitis: Patient presented with severe headache.  He underwent head CT.  CT showed stable CSF collections in the temporal regions bilaterally, left larger than right.  Age  advanced atrophy.  No acute intracranial abnormality.  Also, diffuse acute on chronic sinusitis.  Augmentin 875-125 mg every 12 hours initiated.  Headache improved throughout admission.  Patient will complete treatment with Augmentin 8  75-125 mg every 12 hours for a total of 10 days, medication sent to patient's pharmacy.  Patient was therefore discharged home today in a hemodynamically stable condition.   [name] [name] will follow-up with PCP within 1 week of this discharge. [name] [name] was counseled extensively about nonpharmacologic means of pain management, patient verbalized understanding and was appreciative of  the care received during this admission.   We discussed the need for good hydration, monitoring of hydration status, avoidance of heat, cold, stress, and infection triggers. We discussed the need to be adherent with taking folic acid.  Patient was reminded of the need to seek medical attention immediately if any symptom of bleeding, anemia, or infection occurs.  Discharge Exam: Vitals:   03/25/20 0426 03/25/20 0500  BP:  123/73  Pulse:  90  Resp:  16  Temp:  98.3 F (36.8 C)  SpO2: 99% 100%   Vitals:   03/24/20 2229 03/25/20 0144 03/25/20 0426 03/25/20 0500  BP:  117/80  123/73  Pulse:  91  90  Resp: '16 16  16  '$ Temp:  97.8 F (36.6 C)  98.3 F (36.8 C)  TempSrc:  Oral  Oral  SpO2: 99% 96% 99% 100%  Weight:      Height:        General appearance : Awake, alert, not in any distress. Speech Clear. Not toxic looking HEENT: Atraumatic and Normocephalic, pupils equally reactive to light and accomodation Neck: Supple, no JVD. No cervical lymphadenopathy.  Chest: Good air entry bilaterally, no added sounds  CVS: S1 S2 regular, no murmurs.  Abdomen: Bowel sounds present, Non tender and not distended with no gaurding, rigidity or rebound. Extremities: B/L Lower Ext shows no edema, both legs are warm to touch Neurology: Awake alert, and oriented X 3, CN II-XII intact, Non focal Skin: No Rash  Discharge Instructions  Discharge Instructions    Discharge patient   Complete by: As directed    Discharge disposition: 01-Home or Self Care   Discharge patient date: 03/25/2020     Allergies as of 03/25/2020       Reactions   Nsaids Other (See Comments)   Kidney disease   Morphine And Related Other (See Comments)   "Real bad headaches"       Medication List    TAKE these medications   acetaminophen 500 MG tablet Commonly known as: TYLENOL Take 1 tablet (500 mg total) by mouth every 6 (six) hours as needed. What changed: reasons to take this   allopurinol 100 MG tablet Commonly known as: ZYLOPRIM Take 100 mg by mouth daily.   amoxicillin-clavulanate 875-125 MG tablet Commonly known as: AUGMENTIN Take 1 tablet by mouth every 12 (twelve) hours.   folic acid 1 MG tablet Commonly known as: FOLVITE TAKE 1 TABLET BY MOUTH EVERY DAY   nortriptyline 25 MG capsule Commonly known as: PAMELOR TAKE 1 CAPSULE BY MOUTH 2 TIMES DAILY. What changed: See the new instructions.   oxyCODONE 15 mg 12 hr tablet Commonly known as: OxyCONTIN Take 1 tablet (15 mg total) by mouth every 12 (twelve) hours.   Oxycodone HCl 10 MG Tabs Take 1 tablet (10 mg total) by mouth every 6 (six) hours as needed.   sodium bicarbonate 650 MG tablet Take 650 mg by mouth 2 (two) times  daily.   Vitamin D3 50 MCG (2000 UT) Tabs Take 1 each by mouth daily. What changed: how much to take       The results of significant diagnostics from this hospitalization (including imaging, microbiology, ancillary and laboratory) are listed below for reference.    Significant Diagnostic Studies: CT HEAD WO CONTRAST  Result Date: 03/23/2020 CLINICAL DATA:  Headache EXAM: CT HEAD WITHOUT CONTRAST TECHNIQUE: Contiguous axial images were obtained from the base of the skull through the vertex without intravenous contrast. COMPARISON:  01/06/2019 FINDINGS: Brain: Large CSF collections seen in the temporal regions bilaterally, left greater than right, stable since prior study. Diffuse cerebral atrophy, advanced for patient's age. No acute infarction, hemorrhage or hydrocephalus. Vascular: No hyperdense vessel or unexpected calcification.  Skull: No acute calvarial abnormality. Sinuses/Orbits: Air-fluid levels throughout the paranasal sinuses diffusely. Mastoid air cells clear. Other: None IMPRESSION: Stable CSF collections in the temporal regions bilaterally, left larger than right. Age advanced atrophy. No acute intracranial abnormality. Diffuse acute on chronic sinusitis. Electronically Signed   By: Rolm Baptise M.D.   On: 03/23/2020 01:15    Microbiology: No results found for this or any previous visit (from the past 240 hour(s)).   Labs: Basic Metabolic Panel: Recent Labs  Lab 03/22/20 0923 03/23/20 0542 03/24/20 0605 03/25/20 0614  NA 136 134* 136 136  K 4.3 4.8 6.2* 5.9*  CL 105 103 107 106  CO2 21* 21* 19* 22  GLUCOSE 100* 109* 119* 97  BUN 44* 33* 25* 26*  CREATININE 3.01* 2.54* 2.08* 2.35*  CALCIUM 8.6* 8.7* 8.8* 8.7*   Liver Function Tests: Recent Labs  Lab 03/22/20 0923  AST 35  ALT 21  ALKPHOS 248*  BILITOT 1.8*  PROT 7.3  ALBUMIN 3.3*   No results for input(s): LIPASE, AMYLASE in the last 168 hours. No results for input(s): AMMONIA in the last 168 hours. CBC: Recent Labs  Lab 03/22/20 0923 03/23/20 0542 03/24/20 0605 03/25/20 0614  WBC 19.2* 16.5* 13.8* 13.0*  NEUTROABS 11.1*  --   --   --   HGB 6.4* 7.4* 8.3* 7.7*  HCT 17.4* 20.0* 21.3* 22.5*  MCV 110.8* 103.6* 106.0* 109.2*  PLT 346 315 318 314   Cardiac Enzymes: No results for input(s): CKTOTAL, CKMB, CKMBINDEX, TROPONINI in the last 168 hours. BNP: Invalid input(s): POCBNP CBG: No results for input(s): GLUCAP in the last 168 hours.  Time coordinating discharge: 35 minutes  Signed:  [name]  APRN, MSN, FNP-C [facility] [place] [address] [place], [place] [address] [phone]  [facility] 03/27/2020, 5:27 AM

## 2020-03-25 NOTE — Care Management Important Message (Signed)
Important Message  Patient Details IM Letter given to the Patient. Name: [name] [name]. MRN: [id number] Date of Birth: [birth year]   Medicare Important Message Given:  Yes     Kerin Salen 03/25/2020, 9:52 AM

## 2020-03-25 NOTE — Progress Notes (Signed)
Patient discharged to home with family, discharge instructions reviewed with patient who verbalized understanding. 

## 2020-03-25 NOTE — Discharge Instructions (Signed)
Amoxicillin; Clavulanic Acid Tablets What is this medicine? AMOXICILLIN; CLAVULANIC ACID (a mox i SIL in; KLAV yoo lan ic AS id) is a penicillin antibiotic. It treats some infections caused by bacteria. It will not work for colds, the flu, or other viruses. This medicine may be used for other purposes; ask your health care provider or pharmacist if you have questions. COMMON BRAND NAME(S): Augmentin What should I tell my health care provider before I take this medicine? They need to know if you have any of these conditions:  kidney disease  liver disease  mononucleosis  stomach or intestine problems such as colitis  an unusual or allergic reaction to amoxicillin, other penicillin or cephalosporin antibiotics, clavulanic acid, other medicines, foods, dyes, or preservatives  pregnant or trying to get pregnant  breast-feeding How should I use this medicine? Take this drug by mouth. Take it as directed on the prescription label at the same time every day. Take it with food at the start of a meal or snack. Take all of this drug unless your health care provider tells you to stop it early. Keep taking it even if you think you are better. Talk to your health care provider about the use of this drug in children. While it may be prescribed for selected conditions, precautions do apply. Overdosage: If you think you have taken too much of this medicine contact a poison control center or emergency room at once. NOTE: This medicine is only for you. Do not share this medicine with others. What if I miss a dose? If you miss a dose, take it as soon as you can. If it is almost time for your next dose, take only that dose. Do not take double or extra doses. What may interact with this medicine?  allopurinol  anticoagulants  birth control pills  methotrexate  probenecid This list may not describe all possible interactions. Give your health care provider a list of all the medicines, herbs,  non-prescription drugs, or dietary supplements you use. Also tell them if you smoke, drink alcohol, or use illegal drugs. Some items may interact with your medicine. What should I watch for while using this medicine? Tell your health care provider if your symptoms do not start to get better or if they get worse. This medicine may cause serious skin reactions. They can happen weeks to months after starting the medicine. Contact your health care provider right away if you notice fevers or flu-like symptoms with a rash. The rash may be red or purple and then turn into blisters or peeling of the skin. Or, you might notice a red rash with swelling of the face, lips or lymph nodes in your neck or under your arms. Do not treat diarrhea with over the counter products. Contact your health care provider if you have diarrhea that lasts more than 2 days or if it is severe and watery. If you have diabetes, you may get a false-positive result for sugar in your urine. Check with your health care provider. Birth control may not work properly while you are taking this medicine. Talk to your health care provider about using an extra method of birth control. What side effects may I notice from receiving this medicine? Side effects that you should report to your doctor or health care provider as soon as possible:  allergic reactions (skin rash, itching or hives; swelling of the face, lips, or tongue)  bloody or watery diarrhea  dark urine  infection (fever, chills, cough, sore  throat, or pain)  kidney injury (trouble passing urine or change in the amount of urine)  redness, blistering, peeling, or loosening of the skin, including inside the mouth  seizures  thrush (white patches in the mouth or mouth sores)  trouble breathing  unusual bruising or bleeding  unusually weak or tired Side effects that usually do not require medical attention (report to your doctor or health care provider if they continue or  are bothersome):  diarrhea  dizziness  headache  nausea, vomiting  unusual vaginal discharge, itching, or odor  upset stomach This list may not describe all possible side effects. Call your doctor for medical advice about side effects. You may report side effects to FDA at 1-800-FDA-1088. Where should I keep my medicine? Keep out of the reach of children and pets. Store at room temperature between 20 and 25 degrees C (68 and 77 degrees F). Throw away any unused drug after the expiration date. NOTE: This sheet is a summary. It may not cover all possible information. If you have questions about this medicine, talk to your doctor, pharmacist, or health care provider.  2021 Elsevier/Gold Standard (2019-12-24 09:45:03)   Sickle Cell Anemia, Adult  Sickle cell anemia is a condition where your red blood cells are shaped like sickles. Red blood cells carry oxygen through the body. Sickle-shaped cells do not live as long as normal red blood cells. They also clump together and block blood from flowing through the blood vessels. This prevents the body from getting enough oxygen. Sickle cell anemia causes organ damage and pain. It also increases the risk of infection. Follow these instructions at home: Medicines  Take over-the-counter and prescription medicines only as told by your doctor.  If you were prescribed an antibiotic medicine, take it as told by your doctor. Do not stop taking the antibiotic even if you start to feel better.  If you develop a fever, do not take medicines to lower the fever right away. Tell your doctor about the fever. Managing pain, stiffness, and swelling  Try these methods to help with pain: ? Use a heating pad. ? Take a warm bath. ? Distract yourself, such as by watching TV. Eating and drinking  Drink enough fluid to keep your pee (urine) clear or pale yellow. Drink more in hot weather and during exercise.  Limit or avoid alcohol.  Eat a healthy diet.  Eat plenty of fruits, vegetables, whole grains, and lean protein.  Take vitamins and supplements as told by your doctor. Traveling  When traveling, keep these with you: ? Your medical information. ? The names of your doctors. ? Your medicines.  If you need to take an airplane, talk to your doctor first. Activity  Rest often.  Avoid exercises that make your heart beat much faster, such as jogging. General instructions  Do not use products that have nicotine or tobacco, such as cigarettes and e-cigarettes. If you need help quitting, ask your doctor.  Consider wearing a medical alert bracelet.  Avoid being in high places (high altitudes), such as mountains.  Avoid very hot or cold temperatures.  Avoid places where the temperature changes a lot.  Keep all follow-up visits as told by your doctor. This is important. Contact a doctor if:  A joint hurts.  Your feet or hands hurt or swell.  You feel tired (fatigued). Get help right away if:  You have symptoms of infection. These include: ? Fever. ? Chills. ? Being very tired. ? Irritability. ? Poor eating. ?  Throwing up (vomiting).  You feel dizzy or faint.  You have new stomach pain, especially on the left side.  You have a an erection (priapism) that lasts more than 4 hours.  You have numbness in your arms or legs.  You have a hard time moving your arms or legs.  You have trouble talking.  You have pain that does not go away when you take medicine.  You are short of breath.  You are breathing fast.  You have a long-term cough.  You have pain in your chest.  You have a bad headache.  You have a stiff neck.  Your stomach looks bloated even though you did not eat much.  Your skin is pale.  You suddenly cannot see well. Summary  Sickle cell anemia is a condition where your red blood cells are shaped like sickles.  Follow your doctor's advice on ways to manage pain, food to eat, activities to do,  and steps to take for safe travel.  Get medical help right away if you have any signs of infection, such as a fever. This information is not intended to replace advice given to you by your health care provider. Make sure you discuss any questions you have with your health care provider. Document Revised: 06/26/2019 Document Reviewed: 06/26/2019 Elsevier Patient Education  [place].

## 2020-03-29 ENCOUNTER — Non-Acute Institutional Stay (HOSPITAL_COMMUNITY)
Admit: 2020-03-29 | Discharge: 2020-03-29 | Disposition: A | Discharge status: Home / Self Care | Payer: 59 | Source: Ambulatory Visit | Attending: Internal Medicine | Admitting: Internal Medicine

## 2020-03-29 ENCOUNTER — Other Ambulatory Visit: Payer: Self-pay

## 2020-03-29 DIAGNOSIS — D5700 Hb-SS disease with crisis, unspecified: Secondary | ICD-10-CM | POA: Insufficient documentation

## 2020-03-29 LAB — CBC WITH DIFFERENTIAL/PLATELET
Abs Immature Granulocytes: 0.06 10*3/uL (ref 0.00–0.07)
Basophils Absolute: 0 10*3/uL (ref 0.0–0.1)
Basophils Relative: 0 %
Eosinophils Absolute: 0.3 10*3/uL (ref 0.0–0.5)
Eosinophils Relative: 1 %
HCT: 22.1 % — ABNORMAL LOW (ref 39.0–52.0)
Hemoglobin: 8.4 g/dL — ABNORMAL LOW (ref 13.0–17.0)
Immature Granulocytes: 0 %
Lymphocytes Relative: 31 %
Lymphs Abs: 5.5 10*3/uL — ABNORMAL HIGH (ref 0.7–4.0)
MCH: 41.4 pg — ABNORMAL HIGH (ref 26.0–34.0)
MCHC: 38 g/dL — ABNORMAL HIGH (ref 30.0–36.0)
MCV: 108.9 fL — ABNORMAL HIGH (ref 80.0–100.0)
Monocytes Absolute: 1.7 10*3/uL — ABNORMAL HIGH (ref 0.1–1.0)
Monocytes Relative: 10 %
Neutro Abs: 10.1 10*3/uL — ABNORMAL HIGH (ref 1.7–7.7)
Neutrophils Relative %: 58 %
Platelets: 416 10*3/uL — ABNORMAL HIGH (ref 150–400)
RBC: 2.03 MIL/uL — ABNORMAL LOW (ref 4.22–5.81)
RDW: 26.8 % — ABNORMAL HIGH (ref 11.5–15.5)
WBC: 17.6 10*3/uL — ABNORMAL HIGH (ref 4.0–10.5)
nRBC: 0.1 % (ref 0.0–0.2)

## 2020-03-29 LAB — COMPREHENSIVE METABOLIC PANEL
ALT: 20 U/L (ref 0–44)
AST: 31 U/L (ref 15–41)
Albumin: 3.2 g/dL — ABNORMAL LOW (ref 3.5–5.0)
Alkaline Phosphatase: 240 U/L — ABNORMAL HIGH (ref 38–126)
Anion gap: 12 (ref 5–15)
BUN: 30 mg/dL — ABNORMAL HIGH (ref 6–20)
CO2: 29 mmol/L (ref 22–32)
Calcium: 8.3 mg/dL — ABNORMAL LOW (ref 8.9–10.3)
Chloride: 97 mmol/L — ABNORMAL LOW (ref 98–111)
Creatinine, Ser: 2.95 mg/dL — ABNORMAL HIGH (ref 0.61–1.24)
GFR, Estimated: 27 mL/min — ABNORMAL LOW (ref >60)
Glucose, Bld: 88 mg/dL (ref 70–99)
Potassium: 3.9 mmol/L (ref 3.5–5.1)
Sodium: 138 mmol/L (ref 135–145)
Total Bilirubin: 1.2 mg/dL (ref 0.3–1.2)
Total Protein: 7.1 g/dL (ref 6.5–8.1)

## 2020-03-29 NOTE — Progress Notes (Signed)
Patient's labs drawn (CBC w/diff and CMP). Patient tolerated well. AVS offered but patient refused. Alert, oriented and ambulatory at discharge.

## 2020-03-31 ENCOUNTER — Telehealth: Payer: Self-pay | Admitting: Family Medicine

## 2020-03-31 LAB — DRUG SCREEN 764883 11+OXYCO+ALC+CRT-BUND
Amphetamines, Urine: NEGATIVE ng/mL
BENZODIAZ UR QL: NEGATIVE ng/mL
Barbiturate: NEGATIVE ng/mL
Cocaine (Metabolite): NEGATIVE ng/mL
Creatinine: 116.3 mg/dL (ref 20.0–300.0)
Ethanol: NEGATIVE %
Meperidine: NEGATIVE ng/mL
Methadone Screen, Urine: NEGATIVE ng/mL
OPIATE SCREEN URINE: NEGATIVE ng/mL
Phencyclidine: NEGATIVE ng/mL
Propoxyphene: NEGATIVE ng/mL
Tramadol: NEGATIVE ng/mL
pH, Urine: 5.8 (ref 4.5–8.9)

## 2020-03-31 LAB — OXYCODONE/OXYMORPHONE, CONFIRM
OXYCODONE/OXYMORPH: POSITIVE — AB
OXYCODONE: 348 ng/mL
OXYCODONE: POSITIVE — AB
OXYMORPHONE (GC/MS): 1372 ng/mL
OXYMORPHONE: POSITIVE — AB

## 2020-03-31 LAB — CANNABINOID CONFIRMATION, UR
CANNABINOIDS: POSITIVE — AB
Carboxy THC GC/MS Conf: 566 ng/mL

## 2020-03-31 NOTE — Telephone Encounter (Signed)
OXYCODONE 10 MG & OXYCODONE 15 MG.  PT [phone]

## 2020-03-31 NOTE — Progress Notes (Signed)
Reviewed all laboratory values, hemoglobin 8.4, consistent with patient's baseline.  Patient is status post Aranesp 1 week prior.  We will follow-up in 2 weeks for labs.  Serum creatinine, indicator of kidney functioning is elevated at 2.9.  Slightly above patient's baseline.  Continue to follow-up with nephrology as scheduled.  Forward labs to [facility].   Ensure that patient has follow-up lab appointment scheduled in 2 weeks.

## 2020-04-01 ENCOUNTER — Other Ambulatory Visit: Payer: Self-pay | Admitting: Family Medicine

## 2020-04-01 DIAGNOSIS — G894 Chronic pain syndrome: Secondary | ICD-10-CM

## 2020-04-01 MED ORDER — OXYCODONE HCL ER 15 MG PO T12A: 15.0000 mg | EXTENDED_RELEASE_TABLET | Freq: Two times a day (BID) | ORAL | 0 refills | Status: DC

## 2020-04-01 MED ORDER — OXYCODONE HCL 10 MG PO TABS: 10.0000 mg | ORAL_TABLET | Freq: Four times a day (QID) | ORAL | 0 refills | Status: DC | PRN

## 2020-04-01 MED FILL — oxyCODONE HCL 10 MG TABS: 10 | 15 days supply | Qty: 60 | Fill #0

## 2020-04-01 NOTE — Progress Notes (Signed)
Reviewed PDMP substance reporting system prior to prescribing opiate medications. No inconsistencies noted.   Meds ordered this encounter  Medications  . oxyCODONE (OXYCONTIN) 15 mg 12 hr tablet    Sig: Take 1 tablet (15 mg total) by mouth every 12 (twelve) hours.    Dispense:  60 tablet    Refill:  0    Order Specific Question:   Supervising Provider    Answer:   [name] [id number]  . Oxycodone HCl 10 MG TABS    Sig: Take 1 tablet (10 mg total) by mouth every 6 (six) hours as needed.    Dispense:  60 tablet    Refill:  0    Order Specific Question:   Supervising Provider    Answer:   [name] [id number]    [name]  APRN, MSN, FNP-C [facility] [address] [place], [place] [address] [phone]

## 2020-04-01 NOTE — Telephone Encounter (Signed)
Pt checking status Both oxycodone prescriptions. Confirm to sent to [facility]. Pt request urgently for pain.

## 2020-04-02 MED FILL — OxyCONTIN 15 MG T12A: 15 | 30 days supply | Qty: 60 | Fill #0

## 2020-04-05 ENCOUNTER — Telehealth (HOSPITAL_COMMUNITY): Payer: Self-pay

## 2020-04-05 NOTE — Telephone Encounter (Signed)
Patient called in. Complains of pain and swelling to legs bilaterally rates 10/10. Denied chest pain, abd pain, fever, diarrhea or priapism. Pt states he started a new medication Randie Heinz that is causing some intermittent NV for the last two days but he is still able to tolerate some food and liquids. Wants to come in for treatment. Last took Oxycodone 15 mg at 2am. Denies any recent visits to the ED, was discharged from the hospital around 03/25/20 per pt. Denies any exposure to anyone who is Covid positive in the last 2 weeks, has not been tested for covid within the last two weeks and denies flu like symptoms today. Pt states his mother is his transport. Dr. Doreene Burke notified, pt instructed to go to ED today. Pt made aware, verbalized understanding.

## 2020-04-08 IMAGING — CR DG FOOT COMPLETE 3+V*L*
3 series · 3 of 3 positions shown · non-contrast
Comparison: 08/20/2018

CLINICAL DATA: Pain, sickle crisis

EXAM:
LEFT FOOT - COMPLETE 3+ VIEW; LEFT ANKLE COMPLETE - 3+ VIEW

[x foot ap left]
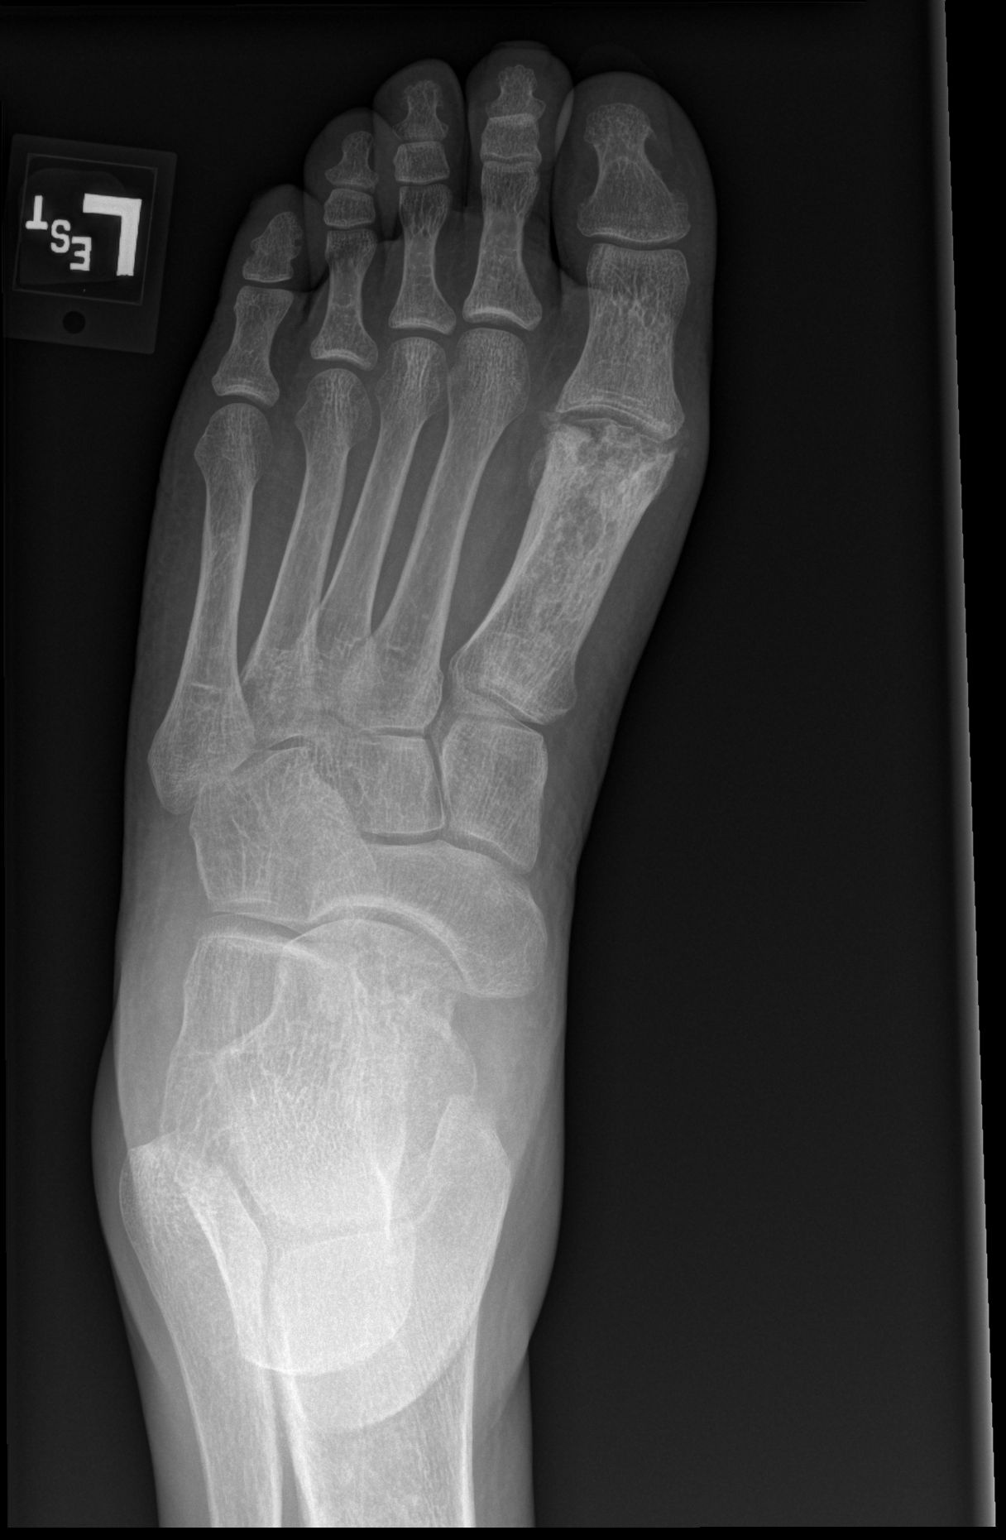

[x foot obl left]
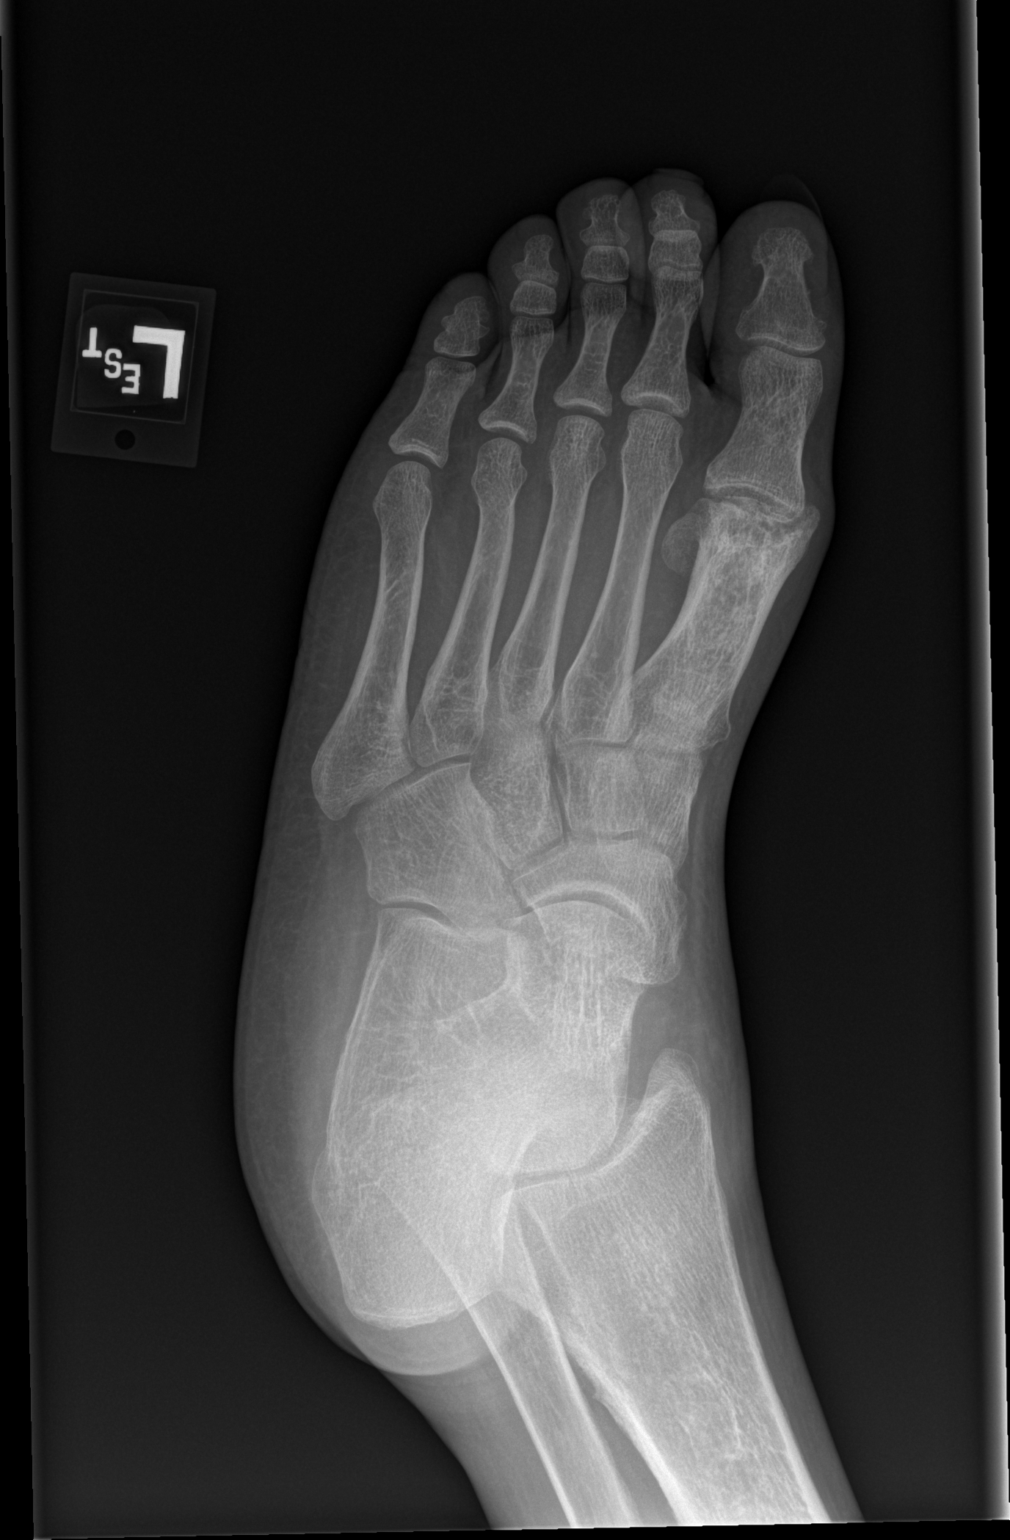

[x foot lat left]
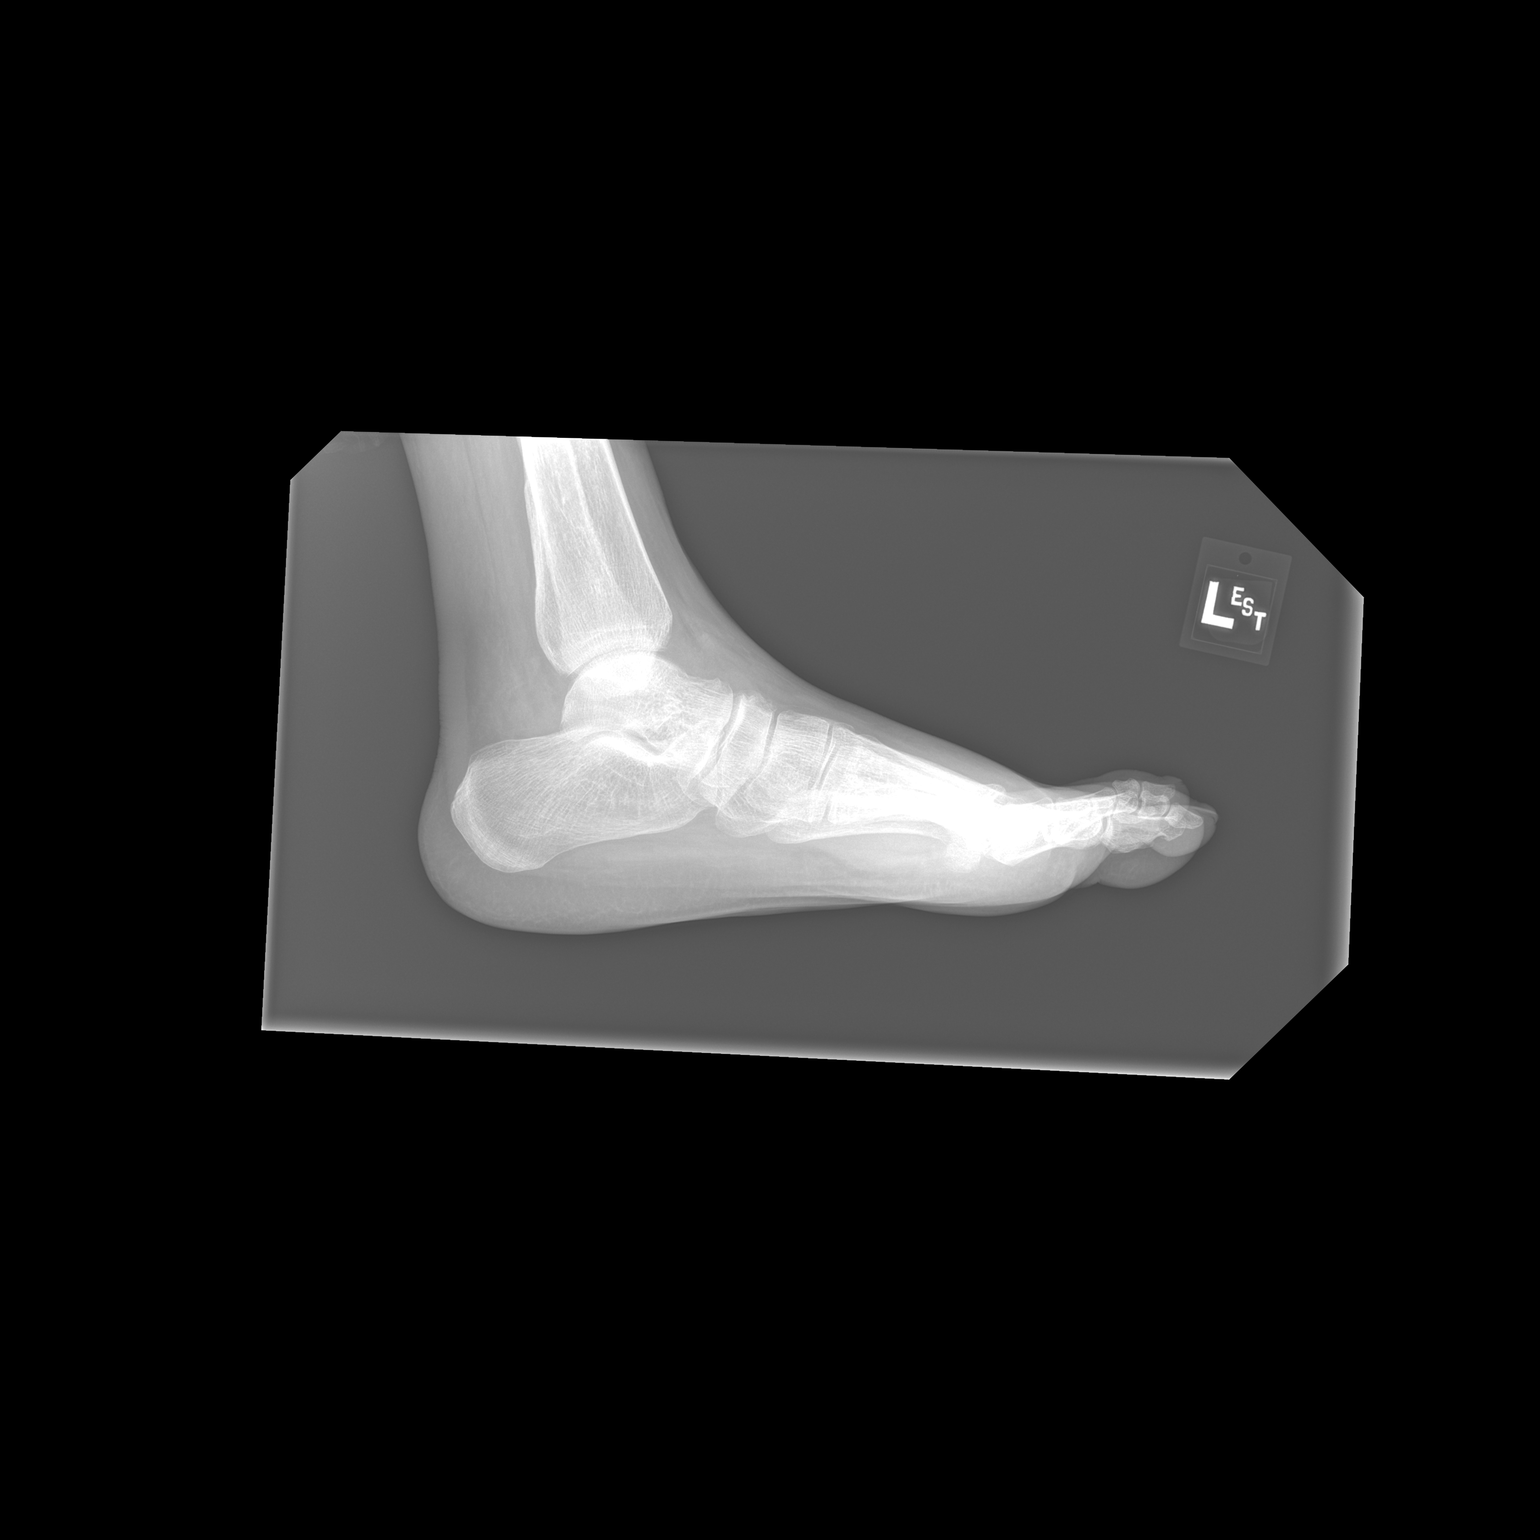

[3 of 3 positions shown; findings below may reference images not displayed]

FINDINGS: No fracture or dislocation of the left foot. There is
redemonstrated, sclerosis and severe distal arthritic change of the
left first metatarsal at the metatarsophalangeal joint. The
remaining osseous structures, including the left first proximal
phalanx, are intact. The joint spaces are preserved with the
exception of the first metatarsophalangeal joint. Soft tissues are
unremarkable.
IMPRESSION: No fracture or dislocation of the left foot. There is
redemonstrated, sclerosis and severe distal arthritic change of the
left first metatarsal at the metatarsophalangeal joint. The
remaining osseous structures, including the left first proximal
phalanx, are intact. Findings are unchanged compared to examination
dated 08/20/2018 and remain consistent with posttraumatic arthrosis
or advanced avascular necrosis.

## 2020-04-10 ENCOUNTER — Encounter (HOSPITAL_COMMUNITY): Payer: Self-pay

## 2020-04-10 ENCOUNTER — Emergency Department (HOSPITAL_COMMUNITY): Payer: 59

## 2020-04-10 ENCOUNTER — Inpatient Hospital Stay (HOSPITAL_COMMUNITY)
Admission: EM | Admit: 2020-04-10 | Discharge: 2020-04-13 | DRG: 812 | Disposition: A | Discharge status: Home / Self Care | Payer: 59 | Attending: Internal Medicine | Admitting: Internal Medicine

## 2020-04-10 ENCOUNTER — Other Ambulatory Visit: Payer: Self-pay

## 2020-04-10 DIAGNOSIS — M7989 Other specified soft tissue disorders: Secondary | ICD-10-CM

## 2020-04-10 DIAGNOSIS — N179 Acute kidney failure, unspecified: Secondary | ICD-10-CM | POA: Diagnosis present

## 2020-04-10 DIAGNOSIS — N184 Chronic kidney disease, stage 4 (severe): Secondary | ICD-10-CM | POA: Diagnosis present

## 2020-04-10 DIAGNOSIS — D5700 Hb-SS disease with crisis, unspecified: Secondary | ICD-10-CM | POA: Diagnosis present

## 2020-04-10 DIAGNOSIS — F1120 Opioid dependence, uncomplicated: Secondary | ICD-10-CM | POA: Diagnosis present

## 2020-04-10 DIAGNOSIS — Z885 Allergy status to narcotic agent status: Secondary | ICD-10-CM

## 2020-04-10 DIAGNOSIS — Z20822 Contact with and (suspected) exposure to covid-19: Secondary | ICD-10-CM | POA: Diagnosis present

## 2020-04-10 DIAGNOSIS — Z9049 Acquired absence of other specified parts of digestive tract: Secondary | ICD-10-CM | POA: Diagnosis not present

## 2020-04-10 DIAGNOSIS — Z886 Allergy status to analgesic agent status: Secondary | ICD-10-CM

## 2020-04-10 DIAGNOSIS — D75839 Thrombocytosis, unspecified: Secondary | ICD-10-CM | POA: Diagnosis present

## 2020-04-10 DIAGNOSIS — F17210 Nicotine dependence, cigarettes, uncomplicated: Secondary | ICD-10-CM | POA: Diagnosis present

## 2020-04-10 DIAGNOSIS — Z803 Family history of malignant neoplasm of breast: Secondary | ICD-10-CM

## 2020-04-10 DIAGNOSIS — G894 Chronic pain syndrome: Secondary | ICD-10-CM | POA: Diagnosis present

## 2020-04-10 DIAGNOSIS — D638 Anemia in other chronic diseases classified elsewhere: Secondary | ICD-10-CM | POA: Diagnosis present

## 2020-04-10 DIAGNOSIS — K219 Gastro-esophageal reflux disease without esophagitis: Secondary | ICD-10-CM | POA: Diagnosis present

## 2020-04-10 LAB — CBC WITH DIFFERENTIAL/PLATELET
Abs Immature Granulocytes: 0.04 10*3/uL (ref 0.00–0.07)
Basophils Absolute: 0 10*3/uL (ref 0.0–0.1)
Basophils Relative: 0 %
Eosinophils Absolute: 0.3 10*3/uL (ref 0.0–0.5)
Eosinophils Relative: 3 %
HCT: 26.1 % — ABNORMAL LOW (ref 39.0–52.0)
Hemoglobin: 9.2 g/dL — ABNORMAL LOW (ref 13.0–17.0)
Immature Granulocytes: 0 %
Lymphocytes Relative: 40 %
Lymphs Abs: 5 10*3/uL — ABNORMAL HIGH (ref 0.7–4.0)
MCH: 39 pg — ABNORMAL HIGH (ref 26.0–34.0)
MCHC: 35.2 g/dL (ref 30.0–36.0)
MCV: 110.6 fL — ABNORMAL HIGH (ref 80.0–100.0)
Monocytes Absolute: 1 10*3/uL (ref 0.1–1.0)
Monocytes Relative: 8 %
Neutro Abs: 6.1 10*3/uL (ref 1.7–7.7)
Neutrophils Relative %: 49 %
Platelets: 421 10*3/uL — ABNORMAL HIGH (ref 150–400)
RBC: 2.36 MIL/uL — ABNORMAL LOW (ref 4.22–5.81)
RDW: 22 % — ABNORMAL HIGH (ref 11.5–15.5)
WBC: 12.5 10*3/uL — ABNORMAL HIGH (ref 4.0–10.5)
nRBC: 0.2 % (ref 0.0–0.2)

## 2020-04-10 LAB — COMPREHENSIVE METABOLIC PANEL
ALT: 22 U/L (ref 0–44)
AST: 32 U/L (ref 15–41)
Albumin: 2.7 g/dL — ABNORMAL LOW (ref 3.5–5.0)
Alkaline Phosphatase: 208 U/L — ABNORMAL HIGH (ref 38–126)
Anion gap: 10 (ref 5–15)
BUN: 28 mg/dL — ABNORMAL HIGH (ref 6–20)
CO2: 25 mmol/L (ref 22–32)
Calcium: 8.1 mg/dL — ABNORMAL LOW (ref 8.9–10.3)
Chloride: 103 mmol/L (ref 98–111)
Creatinine, Ser: 2.77 mg/dL — ABNORMAL HIGH (ref 0.61–1.24)
GFR, Estimated: 29 mL/min — ABNORMAL LOW (ref >60)
Glucose, Bld: 132 mg/dL — ABNORMAL HIGH (ref 70–99)
Potassium: 4.2 mmol/L (ref 3.5–5.1)
Sodium: 138 mmol/L (ref 135–145)
Total Bilirubin: 1 mg/dL (ref 0.3–1.2)
Total Protein: 6.9 g/dL (ref 6.5–8.1)

## 2020-04-10 LAB — RETICULOCYTES
Immature Retic Fract: 20.1 % — ABNORMAL HIGH (ref 2.3–15.9)
RBC.: 2.37 MIL/uL — ABNORMAL LOW (ref 4.22–5.81)
Retic Count, Absolute: 63.3 10*3/uL (ref 19.0–186.0)
Retic Ct Pct: 2.7 % (ref 0.4–3.1)

## 2020-04-10 LAB — SARS CORONAVIRUS 2 (TAT 6-24 HRS): SARS Coronavirus 2: NEGATIVE

## 2020-04-10 MED ORDER — HYDROMORPHONE HCL 2 MG/ML IJ SOLN
2.0000 mg | Freq: Once | INTRAMUSCULAR | Status: AC
  Administered 2020-04-10: 2 mg via INTRAVENOUS
  Filled 2020-04-10: qty 1

## 2020-04-10 MED ORDER — HEPARIN SODIUM (PORCINE) 5000 UNIT/ML IJ SOLN
5000.0000 [IU] | Freq: Three times a day (TID) | INTRAMUSCULAR | Status: DC
  Administered 2020-04-12: 5000 [IU] via SUBCUTANEOUS
  Filled 2020-04-10 (×2): qty 1

## 2020-04-10 MED ORDER — HYDROMORPHONE HCL 2 MG/ML IJ SOLN
2.0000 mg | INTRAMUSCULAR | Status: AC
  Administered 2020-04-10: 2 mg via INTRAVENOUS
  Filled 2020-04-10: qty 1

## 2020-04-10 MED ORDER — POLYETHYLENE GLYCOL 3350 17 G PO PACK: 17.0000 g | PACK | Freq: Every day | ORAL | Status: DC | PRN

## 2020-04-10 MED ORDER — DEXTROSE-NACL 5-0.45 % IV SOLN: INTRAVENOUS | Status: DC

## 2020-04-10 MED ORDER — SODIUM CHLORIDE 0.9% FLUSH: 9.0000 mL | INTRAVENOUS | Status: DC | PRN

## 2020-04-10 MED ORDER — DIPHENHYDRAMINE HCL 25 MG PO CAPS
25.0000 mg | ORAL_CAPSULE | ORAL | Status: DC | PRN
  Administered 2020-04-10: 25 mg via ORAL
  Administered 2020-04-13: 50 mg via ORAL
  Filled 2020-04-10: qty 2
  Filled 2020-04-10: qty 1

## 2020-04-10 MED ORDER — HYDROMORPHONE HCL 1 MG/ML IJ SOLN
1.0000 mg | Freq: Once | INTRAMUSCULAR | Status: AC
  Administered 2020-04-10: 1 mg via INTRAVENOUS
  Filled 2020-04-10: qty 1

## 2020-04-10 MED ORDER — HYDROMORPHONE 1 MG/ML IV SOLN
INTRAVENOUS | Status: DC
  Administered 2020-04-10: 6.5 mg via INTRAVENOUS
  Administered 2020-04-10: 30 mg via INTRAVENOUS
  Administered 2020-04-11: 3.5 mg via INTRAVENOUS
  Administered 2020-04-11: 5.5 mg via INTRAVENOUS
  Administered 2020-04-11: 30 mg via INTRAVENOUS
  Administered 2020-04-11: 6.5 mg via INTRAVENOUS
  Filled 2020-04-10 (×2): qty 30

## 2020-04-10 MED ORDER — NALOXONE HCL 0.4 MG/ML IJ SOLN
0.4000 mg | INTRAMUSCULAR | Status: DC | PRN
Start: 2020-04-10 — End: 2020-04-13

## 2020-04-10 MED ORDER — SENNOSIDES-DOCUSATE SODIUM 8.6-50 MG PO TABS
1.0000 | ORAL_TABLET | Freq: Two times a day (BID) | ORAL | Status: DC
  Filled 2020-04-10 (×2): qty 1

## 2020-04-10 MED ORDER — ONDANSETRON HCL 4 MG/2ML IJ SOLN: 4.0000 mg | Freq: Four times a day (QID) | INTRAMUSCULAR | Status: DC | PRN
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
## 2020-04-10 NOTE — ED Triage Notes (Signed)
Pt arrived via walk in, c/o SCC. States pain and swelling throughout body, started last night.

## 2020-04-10 NOTE — ED Notes (Signed)
ED TO INPATIENT HANDOFF REPORT  Name/Age/Gender [name] [name]. 40 y.o. male  Code Status    Code Status Orders  (From admission, onward)         Start     Ordered   04/10/20 1419  Full code  Continuous        04/10/20 1418        Code Status History    Date Active Date Inactive Code Status Order ID Comments User Context   03/22/2020 1617 03/25/2020 1656 Full Code [id number]  [name], [place] Inpatient   02/26/2020 1319 02/28/2020 1718 Full Code [id number]  [name], [place] Inpatient   02/26/2020 0952 02/26/2020 1319 Full Code [id number]  [name], [place] Inpatient   01/06/2020 1822 01/07/2020 2158 Full Code [id number]  [name], [place] Inpatient   01/06/2020 0856 01/06/2020 1822 Full Code [id number]  [name], [place] Inpatient   12/01/2019 1623 12/02/2019 1612 Full Code [id number]  [name], [place] Inpatient   12/01/2019 1209 12/01/2019 1623 Full Code [id number]  [name], [place] Inpatient   10/01/2019 0403 10/04/2019 1802 Full Code [id number]  Etta Quill, DO ED   08/25/2019 1340 08/31/2019 0150 Full Code [id number]  [name], [place] Inpatient   08/15/2019 0857 08/15/2019 2019 Full Code [id number]  [name], [place] Inpatient   08/02/2019 1001 08/04/2019 1732 Full Code [id number]  Elwyn Reach, MD ED   06/27/2019 2221 06/30/2019 1746 Full Code [id number]  [name], MD ED   06/12/2019 2247 06/13/2019 1959 Full Code [id number]  Jenkins Rouge, MD ED   06/04/2019 1618 06/04/2019 2352 Full Code [id number]  [name], [place] Inpatient   05/06/2019 1648 05/08/2019 1925 Full Code [id number]  [name], [place] Inpatient   04/17/2019 1058 04/17/2019 2311 Full Code [id number]  [name], [place] Inpatient   03/21/2019 1233 03/22/2019 2053 Full Code [id number]  [name], [place] Inpatient   01/22/2019 1350 01/25/2019 1807 Full Code [id number]  [name], [place] Inpatient   01/22/2019 1021 01/22/2019 1349 Full Code [id number]   [name], [place] Inpatient   01/07/2019 0004 01/08/2019 1826 Full Code [id number]  [name], MD ED   12/01/2018 0004 12/03/2018 1359 Full Code [id number]  Quintella Baton, MD Inpatient   11/20/2018 2103 11/22/2018 1659 Full Code [id number]  Shela Leff, MD ED   11/05/2018 0934 11/05/2018 1951 Full Code [id number]  [name], [place] Inpatient   11/02/2018 2113 11/03/2018 1658 Full Code [id number]  Jani Gravel, MD ED   09/23/2018 2006 09/28/2018 2028 Full Code [id number]  Roney Jaffe, MD Inpatient   08/19/2018 0254 08/21/2018 1707 Full Code [id number]  [place] Eric J, DO ED   08/09/2018 1352 08/12/2018 1753 Full Code [id number]  [name], [place] ED   07/22/2018 0342 07/24/2018 1522 Full Code [id number]  [place], [place], DO Inpatient   07/03/2018 2120 07/06/2018 2032 Full Code [id number]  [name], MD ED   05/05/2018 2214 05/08/2018 1455 Full Code [id number]  [name], MD ED   03/07/2018 0043 03/11/2018 1656 Full Code [id number]  Ivor Costa, MD ED   01/21/2018 2008 01/23/2018 1834 Full Code [id number]  Jonetta Osgood, MD ED   09/19/2017 2214 09/22/2017 1633 Full Code [id number]  Bethena Roys, MD ED   07/23/2017 2234 07/26/2017 1527 Full Code [id number]  [place], [place], DO Inpatient   05/12/2017 1514 05/18/2017 1850 Full Code [id number]  [name], MD  Inpatient   04/20/2017 0205 05/03/2017 1855 Full Code [id number]  [name], MD ED   04/07/2017 1712 04/09/2017 1238 Full Code [id number]  Marene Lenz, MD ED   02/27/2017 2140 03/04/2017 1508 Full Code [id number]  Jani Gravel, MD ED   12/13/2016 1812 12/21/2016 1659 Full Code [id number]  Leana Gamer, MD ED   10/14/2016 1944 10/18/2016 1637 Full Code [id number]  Jani Gravel, MD ED   08/02/2016 0124 08/04/2016 1731 Full Code [id number]  Norval Morton, MD ED   02/26/2016 2148 02/27/2016 1750 Full Code [id number]  Etta Quill, DO ED   03/11/2015 1636 03/15/2015 1544 Full Code [id number]  Janora Norlander, DO ED   11/21/2014 0308 11/24/2014 2020 Full Code [id number]  [place], [place] Ram, MD Inpatient   08/18/2014 0148 08/20/2014 1820 Full Code [id number]  Archie Patten, MD Inpatient   03/22/2014 2315 03/24/2014 1826 Full Code [id number]  Toy Baker, MD ED   03/19/2014 2048 03/20/2014 2250 Full Code [id number]  Lorna Few, DO Inpatient   12/26/2013 0344 12/29/2013 2103 Full Code [id number]  Timmothy Euler, MD ED   07/28/2013 1833 07/31/2013 1729 Full Code [id number]  Coral Spikes, DO Inpatient   05/09/2013 1926 05/10/2013 1644 Full Code [id number]  Hilton Sinclair, MD ED   01/24/2013 0143 01/26/2013 1931 Full Code [id number]  Leone Haven, MD Inpatient   09/25/2012 0846 09/26/2012 2020 Full Code [id number]  Ma Hillock, DO ED   Advance Care Planning Activity      Home/SNF/Other Home  Chief Complaint Sickle cell anemia with crisis ([place]) [D57.00]  Level of Care/Admitting Diagnosis ED Disposition    ED Disposition Condition [facility]: [place] [[id number]]  Level of Care: Med-Surg [[id number]]  May admit patient to [facility] or [facility] if equivalent level of care is available:: No  Covid Evaluation: Asymptomatic Screening Protocol (No Symptoms)  Diagnosis: Sickle cell anemia with crisis [facility]) [id number]  Admitting Physician: Elwyn Reach [[id number]]  Attending Physician: Elwyn Reach [[id number]]  Estimated length of stay: past midnight tomorrow  Certification:: I certify this patient will need inpatient services for at least 2 midnights       Medical History Past Medical History:  Diagnosis Date  . Abscess of right iliac fossa 09/24/2018   required Perc Drain 09/24/2018  . Arachnoid Cyst of brain bilaterally    "2 really small ones in the back of my head; inside; saw them w/MRI" (09/25/2012)  . Bacterial pneumonia ~ 2012   "caught it here in the hospital" (09/25/2012)  . Chronic kidney disease    "from my sickle cell"  (09/25/2012)  . CKD (chronic kidney disease), stage II   . Colitis 04/19/2017   CT scan abd/ pelvis  . GERD (gastroesophageal reflux disease)    "after I eat alot of spicey foods" (09/25/2012)  . Gynecomastia, male 07/10/2012  . History of blood transfusion    "always related to sickle cell crisis" (09/25/2012)  . 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   Noted in noted from Hematology notes at [facility]  . Migraines    "take RX qd to prevent them" (09/25/2012)  . Opioid dependence with withdrawal ([place]) 08/30/2012  . Renal insufficiency   . Sickle cell anemia (H[facility])   . Sickle cell crisis ([place]) 09/25/2012  . Sickle cell nephropathy ([place]) 07/10/2012  . Sinus tachycardia   . Tachycardia with heart rate 121-140 beats per minute with ambulation  08/04/2016    Allergies Allergies  Allergen Reactions  . Nsaids Other (See Comments)    Kidney disease  . Morphine And Related Other (See Comments)    "Real bad headaches"     IV Location/Drains/Wounds Patient Lines/Drains/Airways Status    Active Line/Drains/Airways    Name Placement date Placement time Site Days   Peripheral IV 10/01/19 Anterior;Left Forearm 10/01/19  1316  Forearm  192   Peripheral IV 10/02/19 Right;Posterior Hand 10/02/19  1629  Hand  191   Peripheral IV 04/10/20 Anterior;Right;Upper Arm 04/10/20  1443  Arm  less than 1   Midline Single Lumen 02/26/20 Midline Left Brachial 8 cm 0 cm 02/26/20  2215  Brachial  44          Labs/Imaging Results for orders placed or performed during the hospital encounter of 04/10/20 (from the past 48 hour(s))  Comprehensive metabolic panel     Status: Abnormal   Collection Time: 04/10/20  8:57 AM  Result Value Ref Range   Sodium 138 135 - 145 mmol/L   Potassium 4.2 3.5 - 5.1 mmol/L   Chloride 103 98 - 111 mmol/L   CO2 25 22 - 32 mmol/L   Glucose, Bld 132 (H) 70 - 99 mg/dL    Comment: Glucose reference range applies only to samples taken after fasting for at least 8 hours.    BUN 28 (H) 6 - 20 mg/dL   Creatinine, Ser 2.77 (H) 0.61 - 1.24 mg/dL   Calcium 8.1 (L) 8.9 - 10.3 mg/dL   Total Protein 6.9 6.5 - 8.1 g/dL   Albumin 2.7 (L) 3.5 - 5.0 g/dL   AST 32 15 - 41 U/L   ALT 22 0 - 44 U/L   Alkaline Phosphatase 208 (H) 38 - 126 U/L   Total Bilirubin 1.0 0.3 - 1.2 mg/dL   GFR, Estimated 29 (L) >60 mL/min    Comment: (NOTE) Calculated using the CKD-EPI Creatinine Equation (2021)    Anion gap 10 5 - 15    Comment: Performed at [facility], [place] [address] [place], [place] [address]  Reticulocytes     Status: Abnormal   Collection Time: 04/10/20  8:57 AM  Result Value Ref Range   Retic Ct Pct 2.7 0.4 - 3.1 %   RBC. 2.37 (L) 4.22 - 5.81 MIL/uL   Retic Count, Absolute 63.3 19.0 - 186.0 K/uL   Immature Retic Fract 20.1 (H) 2.3 - 15.9 %    Comment: Performed at [facility], [place] [address] [place], [place] [address]  CBC WITH DIFFERENTIAL     Status: Abnormal   Collection Time: 04/10/20  8:57 AM  Result Value Ref Range   WBC 12.5 (H) 4.0 - 10.5 K/uL    Comment: WHITE COUNT CONFIRMED ON SMEAR   RBC 2.36 (L) 4.22 - 5.81 MIL/uL   Hemoglobin 9.2 (L) 13.0 - 17.0 g/dL   HCT 26.1 (L) 39.0 - 52.0 %   MCV 110.6 (H) 80.0 - 100.0 fL   MCH 39.0 (H) 26.0 - 34.0 pg   MCHC 35.2 30.0 - 36.0 g/dL   RDW 22.0 (H) 11.5 - 15.5 %   Platelets 421 (H) 150 - 400 K/uL   nRBC 0.2 0.0 - 0.2 %   Neutrophils Relative % 49 %   Neutro Abs 6.1 1.7 - 7.7 K/uL   Lymphocytes Relative 40 %   Lymphs Abs 5.0 (H) 0.7 - 4.0 K/uL   Monocytes Relative 8 %   Monocytes  Absolute 1.0 0.1 - 1.0 K/uL   Eosinophils Relative 3 %   Eosinophils Absolute 0.3 0.0 - 0.5 K/uL   Basophils Relative 0 %   Basophils Absolute 0.0 0.0 - 0.1 K/uL   Immature Granulocytes 0 %   Abs Immature Granulocytes 0.04 0.00 - 0.07 K/uL   Tear Drop Cells PRESENT    Polychromasia PRESENT    Target Cells PRESENT     Comment: Performed at [facility], [place] [address] [place], [place] [address]   VAS [place] LOWER EXTREMITY VENOUS (DVT) (ONLY [facility] & [facility] 7a-7p)  Result Date: 04/10/2020  Lower Venous DVT Study Indications: Swelling in bilateral knees.  Risk Factors: Sickle cell. Comparison Study: No prior studies. Performing Technologist: Darlin Coco RDMS, RVT  Examination Guidelines: A complete evaluation includes B-mode imaging, spectral Doppler, color Doppler, and power Doppler as needed of all accessible portions of each vessel. Bilateral testing is considered an integral part of a complete examination. Limited examinations for reoccurring indications may be performed as noted. The reflux portion of the exam is performed with the patient in reverse Trendelenburg.  +---------+---------------+---------+-----------+----------+--------------+ RIGHT    CompressibilityPhasicitySpontaneityPropertiesThrombus Aging +---------+---------------+---------+-----------+----------+--------------+ CFV      Full           Yes      Yes                                 +---------+---------------+---------+-----------+----------+--------------+ SFJ      Full                                                        +---------+---------------+---------+-----------+----------+--------------+ FV Prox  Full                                                        +---------+---------------+---------+-----------+----------+--------------+ FV Mid   Full                                                        +---------+---------------+---------+-----------+----------+--------------+ FV DistalFull                                                        +---------+---------------+---------+-----------+----------+--------------+ PFV      Full                                                        +---------+---------------+---------+-----------+----------+--------------+ POP      Full           Yes      Yes                                  +---------+---------------+---------+-----------+----------+--------------+  PTV      Full                                                        +---------+---------------+---------+-----------+----------+--------------+ PERO     Full                                                        +---------+---------------+---------+-----------+----------+--------------+   +---------+---------------+---------+-----------+----------+--------------+ LEFT     CompressibilityPhasicitySpontaneityPropertiesThrombus Aging +---------+---------------+---------+-----------+----------+--------------+ CFV      Full           Yes      Yes                                 +---------+---------------+---------+-----------+----------+--------------+ SFJ      Full                                                        +---------+---------------+---------+-----------+----------+--------------+ FV Prox  Full                                                        +---------+---------------+---------+-----------+----------+--------------+ FV Mid   Full                                                        +---------+---------------+---------+-----------+----------+--------------+ FV DistalFull                                                        +---------+---------------+---------+-----------+----------+--------------+ PFV      Full                                                        +---------+---------------+---------+-----------+----------+--------------+ POP      Full           Yes      Yes                                 +---------+---------------+---------+-----------+----------+--------------+ PTV      Full                                                        +---------+---------------+---------+-----------+----------+--------------+  PERO     Full                                                         +---------+---------------+---------+-----------+----------+--------------+     Summary: RIGHT: - There is no evidence of deep vein thrombosis in the lower extremity.  - A cystic structure is found in the popliteal fossa.  LEFT: - There is no evidence of deep vein thrombosis in the lower extremity.  - A cystic structure is found in the popliteal fossa. - Ultrasound characteristics of enlarged lymph nodes noted in the groin.  *See table(s) above for measurements and observations.    Preliminary    VAS [place] UPPER EXTREMITY VENOUS DUPLEX  Result Date: 04/10/2020 UPPER VENOUS STUDY  Indications: Swelling Risk Factors: Sickle Cell. Comparison Study: No prior studies. Performing Technologist: Darlin Coco RDMS, RVT  Examination Guidelines: A complete evaluation includes B-mode imaging, spectral Doppler, color Doppler, and power Doppler as needed of all accessible portions of each vessel. Bilateral testing is considered an integral part of a complete examination. Limited examinations for reoccurring indications may be performed as noted.  Left Findings: +----------+------------+---------+-----------+----------+--------------+ LEFT      CompressiblePhasicitySpontaneousProperties   Summary     +----------+------------+---------+-----------+----------+--------------+ IJV           Full       Yes       Yes                             +----------+------------+---------+-----------+----------+--------------+ Subclavian    Full       Yes       Yes                             +----------+------------+---------+-----------+----------+--------------+ Axillary      Full       Yes       Yes                             +----------+------------+---------+-----------+----------+--------------+ Brachial      Full                                                 +----------+------------+---------+-----------+----------+--------------+ Radial        Full                                                  +----------+------------+---------+-----------+----------+--------------+ Ulnar         Full                                                 +----------+------------+---------+-----------+----------+--------------+ Cephalic  Not visualized +----------+------------+---------+-----------+----------+--------------+ Basilic       Full                                                 +----------+------------+---------+-----------+----------+--------------+  Summary:  Left: No evidence of deep vein thrombosis in the upper extremity. No evidence of superficial vein thrombosis in the upper extremity. However, unable to visualize the cephalic vein.  *See table(s) above for measurements and observations.    Preliminary     Pending Labs Unresulted Labs (From admission, onward)          Start     Ordered   04/11/20 0500  CBC with Differential/Platelet  Tomorrow morning,   R        04/10/20 1418   04/11/20 0500  Comprehensive metabolic panel  Tomorrow morning,   R        04/10/20 1418   04/10/20 1419  CBC  (heparin)  Once,   STAT       Comments: Baseline for heparin therapy IF NOT ALREADY DRAWN.  Notify MD if PLT < 100 K.    04/10/20 1418   04/10/20 1419  Creatinine, serum  (heparin)  Once,   STAT       Comments: Baseline for heparin therapy IF NOT ALREADY DRAWN.    04/10/20 1418   04/10/20 1328  SARS CORONAVIRUS 2 (TAT 6-24 HRS) Nasopharyngeal Nasopharyngeal Swab  (Tier 3 - Symptomatic/asymptomatic with Precautions)  Once,   STAT       Question Answer Comment  Is this test for diagnosis or screening Screening   Symptomatic for COVID-19 as defined by CDC No   Hospitalized for COVID-19 No   Admitted to ICU for COVID-19 No   Previously tested for COVID-19 Yes   Resident in a congregate (group) care setting No   Employed in healthcare setting No   Has patient completed COVID vaccination(s) (2 doses of Pfizer/Moderna 1 dose of [employer]) Yes   Has patient completed COVID Booster / 3rd dose Unknown      04/10/20 1330          Vitals/Pain Today's Vitals   04/10/20 1302 04/10/20 1327 04/10/20 1409 04/10/20 1430  BP:  108/79  112/79  Pulse:  86  87  Resp:  17  16  Temp:      TempSrc:      SpO2:  99%  99%  PainSc: 9   9      Isolation Precautions Airborne and Contact precautions  Medications Medications  dextrose 5 %-0.45 % sodium chloride infusion ( Intravenous New Bag/Given 04/10/20 0906)  diphenhydrAMINE (BENADRYL) capsule 25-50 mg (25 mg Oral Given 04/10/20 0907)  senna-docusate (Senokot-S) tablet 1 tablet (has no administration in time range)  polyethylene glycol (MIRALAX / GLYCOLAX) packet 17 g (has no administration in time range)  naloxone (NARCAN) injection 0.4 mg (has no administration in time range)    And  sodium chloride flush (NS) 0.9 % injection 9 mL (has no administration in time range)  ondansetron (ZOFRAN) injection 4 mg (has no administration in time range)  heparin injection 5,000 Units (has no administration in time range)  dextrose 5 %-0.45 % sodium chloride infusion (has no administration in time range)  HYDROmorphone (DILAUDID) 1 mg/mL PCA injection (has no administration in time range)  HYDROmorphone (DILAUDID) injection 2 mg (2 mg  Intravenous Given 04/10/20 0907)  HYDROmorphone (DILAUDID) injection 2 mg (2 mg Intravenous Given 04/10/20 0941)  HYDROmorphone (DILAUDID) injection 2 mg (2 mg Intravenous Given 04/10/20 1043)  HYDROmorphone (DILAUDID) injection 1 mg (1 mg Intravenous Given 04/10/20 1302)    Mobility walks

## 2020-04-10 NOTE — H&P (Signed)
History and Physical   [name] [name]. [id number] DOB: 12/08/1980 DOA: 04/10/2020  Referring MD/NP/PA: Dr. Billy Fischer  PCP: [name], FNP   Outpatient Specialists: None  Patient coming from: Home  Chief Complaint: Persistent pain in the low back and legs  HPI: [name] [name]. is a 40 y.o. male with medical history significant of sickle cell disease, chronic kidney disease stage III, iliopsoas abscess, tobacco abuse, colitis, GERD, who presents to the ER with persistent pain 10 out of 10 in his back lower extremities in general. Symptoms consistent with his typical sickle cell crisis. Patient has tried home regimen but no relief. He continues to have the pain and unable to function well. Denied any fever or chills denied any nausea vomiting or diarrhea. Patient received initial treatment in the ER with multiple doses of Dilaudid but no relief. He is being admitted to the hospital for evaluation and treatment..  ED Course: Temperature 98.3 blood pressure 107/79 pulse 108 respirate of 18 oxygen sats 98% room air. Sodium 130 potassium 4.2 chloride 93 with CO2 25 glucose 133. Creatinine is 2.77 and calcium 8.1. White count 12.5 hemoglobin 9.2 and platelets 421. COVID-19 screen is negative. Venous Doppler ultrasound of both upper and lower extremities showed no acute findings no DVT. Patient being admitted with sickle cell crisis.  Review of Systems: As per HPI otherwise 10 point review of systems negative.    Past Medical History:  Diagnosis Date  . Abscess of right iliac fossa 09/24/2018   required Perc Drain 09/24/2018  . Arachnoid Cyst of brain bilaterally    "2 really small ones in the back of my head; inside; saw them w/MRI" (09/25/2012)  . Bacterial pneumonia ~ 2012   "caught it here in the hospital" (09/25/2012)  . Chronic kidney disease    "from my sickle cell" (09/25/2012)  . CKD (chronic kidney disease), stage II   . Colitis 04/19/2017   CT scan abd/  pelvis  . GERD (gastroesophageal reflux disease)    "after I eat alot of spicey foods" (09/25/2012)  . Gynecomastia, male 07/10/2012  . History of blood transfusion    "always related to sickle cell crisis" (09/25/2012)  . 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   Noted in noted from Hematology notes at [facility]  . Migraines    "take RX qd to prevent them" (09/25/2012)  . Opioid dependence with withdrawal ([place]) 08/30/2012  . Renal insufficiency   . Sickle cell anemia (HCC)   . Sickle cell crisis ([place]) 09/25/2012  . Sickle cell nephropathy ([place]) 07/10/2012  . Sinus tachycardia   . Tachycardia with heart rate 121-140 beats per minute with ambulation 08/04/2016    Past Surgical History:  Procedure Laterality Date  . ABCESS DRAINAGE    . CHOLECYSTECTOMY  ~ 2012  . COLONOSCOPY N/A 04/23/2017   Procedure: COLONOSCOPY;  Surgeon: Irene Shipper, MD;  Location: [facility];  Service: Endoscopy;  Laterality: N/A;  . IR FLUORO GUIDE CV LINE RIGHT  12/17/2016  . IR RADIOLOGIST EVAL & MGMT  10/02/2018  . IR RADIOLOGIST EVAL & MGMT  10/15/2018  . IR REMOVAL TUN CV CATH W/O FL  12/21/2016  . IR US GUIDE VASC ACCESS RIGHT  12/17/2016  . spleenectomy       reports that he has been smoking cigarettes. He has been smoking about 0.10 packs per day. He has never used smokeless tobacco. He reports current alcohol use. He reports current drug use. Drug: Marijuana.  Allergies  Allergen Reactions  . Nsaids Other (See Comments)    Kidney disease  . Morphine And Related Other (See Comments)    "Real bad headaches"     Family History  Problem Relation Age of Onset  . Breast cancer Mother      Prior to Admission medications   Medication Sig Start Date End Date Taking? Authorizing Provider  acetaminophen (TYLENOL) 500 MG tablet Take 1 tablet (500 mg total) by mouth every 6 (six) hours as needed. Patient taking differently: Take 500 mg by mouth every 6 (six) hours as needed for mild pain. 03/26/19    [name], FNP  allopurinol (ZYLOPRIM) 100 MG tablet Take 100 mg by mouth daily. 02/12/20   [provider]  amoxicillin-clavulanate (AUGMENTIN) 875-125 MG tablet Take 1 tablet by mouth every 12 (twelve) hours. 03/25/20   [name], FNP  Cholecalciferol (VITAMIN D3) 50 MCG (2000 UT) TABS Take 1 each by mouth daily. Patient taking differently: Take 2,000 Units by mouth daily. 01/13/20   [name], FNP  folic acid (FOLVITE) 1 MG tablet TAKE 1 TABLET BY MOUTH EVERY DAY Patient taking differently: Take 1 mg by mouth daily. 03/22/20   [name], FNP  nortriptyline (PAMELOR) 25 MG capsule TAKE 1 CAPSULE BY MOUTH 2 TIMES DAILY. Patient taking differently: Take 25 mg by mouth 2 (two) times daily. 02/03/20   [name], FNP  oxyCODONE (OXYCONTIN) 15 mg 12 hr tablet Take 1 tablet (15 mg total) by mouth every 12 (twelve) hours. 04/02/20   [name], FNP  Oxycodone HCl 10 MG TABS Take 1 tablet (10 mg total) by mouth every 6 (six) hours as needed. 04/01/20   [name], FNP  sodium bicarbonate 650 MG tablet Take 650 mg by mouth 2 (two) times daily. 11/07/18   [provider]    Physical Exam: Vitals:   04/10/20 0823 04/10/20 1024 04/10/20 1203 04/10/20 1259  BP: 114/83 107/79 107/81 108/79  Pulse: (!) 108 (!) 101 86 84  Resp: '18 18 17 17  '$ Temp: 98.3 F (36.8 C)     TempSrc: Oral     SpO2: 98% 98% 99% 99%      Constitutional: Acutely ill looking in mild distress due to pain Vitals:   04/10/20 0823 04/10/20 1024 04/10/20 1203 04/10/20 1259  BP: 114/83 107/79 107/81 108/79  Pulse: (!) 108 (!) 101 86 84  Resp: '18 18 17 17  '$ Temp: 98.3 F (36.8 C)     TempSrc: Oral     SpO2: 98% 98% 99% 99%   Eyes: PERRL, lids and conjunctivae normal ENMT: Mucous membranes are dry. Posterior pharynx clear of any exudate or lesions.Normal dentition.  Neck: normal, supple, no masses, no thyromegaly Respiratory: clear to auscultation bilaterally,  no wheezing, no crackles. Normal respiratory effort. No accessory muscle use.  Cardiovascular: Sinus tachycardia, no murmurs / rubs / gallops. No extremity edema. 2+ pedal pulses. No carotid bruits.  Abdomen: no tenderness, no masses palpated. No hepatosplenomegaly. Bowel sounds positive.  Musculoskeletal: no clubbing / cyanosis. No joint deformity upper and lower extremities. Good ROM, no contractures. Normal muscle tone.  Skin: no rashes, lesions, ulcers. No induration Neurologic: CN 2-12 grossly intact. Sensation intact, DTR normal. Strength 5/5 in all 4.  Psychiatric: Normal judgment and insight. Alert and oriented x 3. Normal mood.     Labs on Admission: I have personally reviewed following labs and imaging studies  CBC: Recent Labs  Lab 04/10/20 0857  WBC 12.5*  NEUTROABS 6.1  HGB 9.2*  HCT 26.1*  MCV 110.6*  PLT [id number]*   Basic Metabolic Panel: Recent Labs  Lab 04/10/20 0857  NA 138  K 4.2  CL 103  CO2 25  GLUCOSE 132*  BUN 28*  CREATININE 2.77*  CALCIUM 8.1*   GFR: CrCl cannot be calculated (Unknown ideal weight.). Liver Function Tests: Recent Labs  Lab 04/10/20 0857  AST 32  ALT 22  ALKPHOS 208*  BILITOT 1.0  PROT 6.9  ALBUMIN 2.7*   No results for input(s): LIPASE, AMYLASE in the last 168 hours. No results for input(s): AMMONIA in the last 168 hours. Coagulation Profile: No results for input(s): INR, PROTIME in the last 168 hours. Cardiac Enzymes: No results for input(s): CKTOTAL, CKMB, CKMBINDEX, TROPONINI in the last 168 hours. BNP (last 3 results) No results for input(s): PROBNP in the last 8760 hours. HbA1C: No results for input(s): HGBA1C in the last 72 hours. CBG: No results for input(s): GLUCAP in the last 168 hours. Lipid Profile: No results for input(s): CHOL, HDL, LDLCALC, TRIG, CHOLHDL, LDLDIRECT in the last 72 hours. Thyroid Function Tests: No results for input(s): TSH, T4TOTAL, FREET4, T3FREE, THYROIDAB in the last 72 hours. Anemia  Panel: Recent Labs    04/10/20 0857  RETICCTPCT 2.7   Urine analysis:    Component Value Date/Time   COLORURINE YELLOW 02/26/2020 1431   APPEARANCEUR CLEAR 02/26/2020 1431   LABSPEC 1.009 02/26/2020 1431   PHURINE 8.0 02/26/2020 1431   GLUCOSEU 50 (A) 02/26/2020 1431   HGBUR NEGATIVE 02/26/2020 1431   HGBUR small 03/10/2010 1445   BILIRUBINUR neg 03/16/2020 0905   KETONESUR NEGATIVE 02/26/2020 1431   PROTEINUR Positive (A) 03/16/2020 0905   PROTEINUR 100 (A) 02/26/2020 1431   UROBILINOGEN 0.2 03/16/2020 0905   UROBILINOGEN 0.2 05/11/2017 1335   NITRITE neg 03/16/2020 0905   NITRITE NEGATIVE 02/26/2020 1431   LEUKOCYTESUR Negative 03/16/2020 0905   LEUKOCYTESUR NEGATIVE 02/26/2020 1431   Sepsis Labs: '@LABRCNTIP'$ (procalcitonin:4,lacticidven:4) )No results found for this or any previous visit (from the past 240 hour(s)).   Radiological Exams on Admission: VAS [place] LOWER EXTREMITY VENOUS (DVT) (ONLY [facility] & [facility] 7a-7p)  Result Date: 04/10/2020  Lower Venous DVT Study Indications: Swelling in bilateral knees.  Risk Factors: Sickle cell. Comparison Study: No prior studies. Performing Technologist: Darlin Coco RDMS, RVT  Examination Guidelines: A complete evaluation includes B-mode imaging, spectral Doppler, color Doppler, and power Doppler as needed of all accessible portions of each vessel. Bilateral testing is considered an integral part of a complete examination. Limited examinations for reoccurring indications may be performed as noted. The reflux portion of the exam is performed with the patient in reverse Trendelenburg.  +---------+---------------+---------+-----------+----------+--------------+ RIGHT    CompressibilityPhasicitySpontaneityPropertiesThrombus Aging +---------+---------------+---------+-----------+----------+--------------+ CFV      Full           Yes      Yes                                  +---------+---------------+---------+-----------+----------+--------------+ SFJ      Full                                                        +---------+---------------+---------+-----------+----------+--------------+ FV Prox  Full                                                        +---------+---------------+---------+-----------+----------+--------------+  FV Mid   Full                                                        +---------+---------------+---------+-----------+----------+--------------+ FV DistalFull                                                        +---------+---------------+---------+-----------+----------+--------------+ PFV      Full                                                        +---------+---------------+---------+-----------+----------+--------------+ POP      Full           Yes      Yes                                 +---------+---------------+---------+-----------+----------+--------------+ PTV      Full                                                        +---------+---------------+---------+-----------+----------+--------------+ PERO     Full                                                        +---------+---------------+---------+-----------+----------+--------------+   +---------+---------------+---------+-----------+----------+--------------+ LEFT     CompressibilityPhasicitySpontaneityPropertiesThrombus Aging +---------+---------------+---------+-----------+----------+--------------+ CFV      Full           Yes      Yes                                 +---------+---------------+---------+-----------+----------+--------------+ SFJ      Full                                                        +---------+---------------+---------+-----------+----------+--------------+ FV Prox  Full                                                         +---------+---------------+---------+-----------+----------+--------------+ FV Mid   Full                                                        +---------+---------------+---------+-----------+----------+--------------+  FV DistalFull                                                        +---------+---------------+---------+-----------+----------+--------------+ PFV      Full                                                        +---------+---------------+---------+-----------+----------+--------------+ POP      Full           Yes      Yes                                 +---------+---------------+---------+-----------+----------+--------------+ PTV      Full                                                        +---------+---------------+---------+-----------+----------+--------------+ PERO     Full                                                        +---------+---------------+---------+-----------+----------+--------------+     Summary: RIGHT: - There is no evidence of deep vein thrombosis in the lower extremity.  - A cystic structure is found in the popliteal fossa.  LEFT: - There is no evidence of deep vein thrombosis in the lower extremity.  - A cystic structure is found in the popliteal fossa. - Ultrasound characteristics of enlarged lymph nodes noted in the groin.  *See table(s) above for measurements and observations.    Preliminary    VAS [place] UPPER EXTREMITY VENOUS DUPLEX  Result Date: 04/10/2020 UPPER VENOUS STUDY  Indications: Swelling Risk Factors: Sickle Cell. Comparison Study: No prior studies. Performing Technologist: Darlin Coco RDMS, RVT  Examination Guidelines: A complete evaluation includes B-mode imaging, spectral Doppler, color Doppler, and power Doppler as needed of all accessible portions of each vessel. Bilateral testing is considered an integral part of a complete examination. Limited examinations for reoccurring indications may be  performed as noted.  Left Findings: +----------+------------+---------+-----------+----------+--------------+ LEFT      CompressiblePhasicitySpontaneousProperties   Summary     +----------+------------+---------+-----------+----------+--------------+ IJV           Full       Yes       Yes                             +----------+------------+---------+-----------+----------+--------------+ Subclavian    Full       Yes       Yes                             +----------+------------+---------+-----------+----------+--------------+ Axillary      Full       Yes  Yes                             +----------+------------+---------+-----------+----------+--------------+ Brachial      Full                                                 +----------+------------+---------+-----------+----------+--------------+ Radial        Full                                                 +----------+------------+---------+-----------+----------+--------------+ Ulnar         Full                                                 +----------+------------+---------+-----------+----------+--------------+ Cephalic                                            Not visualized +----------+------------+---------+-----------+----------+--------------+ Basilic       Full                                                 +----------+------------+---------+-----------+----------+--------------+  Summary:  Left: No evidence of deep vein thrombosis in the upper extremity. No evidence of superficial vein thrombosis in the upper extremity. However, unable to visualize the cephalic vein.  *See table(s) above for measurements and observations.    Preliminary       Assessment/Plan Active Problems:   CKD (chronic kidney disease), stage IV (HCC)   Sickle cell anemia with crisis (HCC)   GERD (gastroesophageal reflux disease)   Chronic pain syndrome     #1 sickle cell painful crisis:  Patient will be admitted and initiated on IV fluids D5 half-normal, Dilaudid PCA, home regimen, no Toradol due to chronic kidney disease. Continue to monitor pain.  #2 anemia of chronic disease: Patient will be monitored closely.  #3 GERD: Continue PPI  #4 CKD3: Stable at Baseline. Continue to treatment  #5 Leucocytosis: Due to VOC. Continue to monitor.   DVT prophylaxis: Lovenox  Code Status: Full code Family Communication: No family at bedside Disposition Plan: Home Consults called: None Admission status: Inpatient  Severity of Illness: The appropriate patient status for this patient is INPATIENT. Inpatient status is judged to be reasonable and necessary in order to provide the required intensity of service to ensure the patient's safety. The patient's presenting symptoms, physical exam findings, and initial radiographic and laboratory data in the context of their chronic comorbidities is felt to place them at high risk for further clinical deterioration. Furthermore, it is not anticipated that the patient will be medically stable for discharge from the hospital within 2 midnights of admission. The following factors support the patient status of inpatient.   " The patient's presenting symptoms include pain in buttocks and legs. " The worrisome physical exam findings  include tenderness. " The initial radiographic and laboratory data are worrisome because of leukocytosis and renal failure. " The chronic co-morbidities include chronic kidney disease and sickle cell disease.   * I certify that at the point of admission it is my clinical judgment that the patient will require inpatient hospital care spanning beyond 2 midnights from the point of admission due to high intensity of service, high risk for further deterioration and high frequency of surveillance required.Barbette Merino MD [facility] Pager [phone]  If 7PM-7AM, please contact  night-coverage [email or web address] Password [email or web address]  04/10/2020, 1:05 PM

## 2020-04-10 NOTE — Progress Notes (Signed)
Lower extremity venous bilateral and upper extremity venous LT study completed.  Preliminary results relayed to Billy Fischer, MD.   See CV Proc for preliminary results report.   Darlin Coco, RDMS, RVT

## 2020-04-10 NOTE — Progress Notes (Signed)
Patient is refusing tele monitoring. MD messaged.

## 2020-04-11 LAB — CBC WITH DIFFERENTIAL/PLATELET
Abs Immature Granulocytes: 0.03 10*3/uL (ref 0.00–0.07)
Basophils Absolute: 0 10*3/uL (ref 0.0–0.1)
Basophils Relative: 0 %
Eosinophils Absolute: 0.3 10*3/uL (ref 0.0–0.5)
Eosinophils Relative: 2 %
HCT: 25.3 % — ABNORMAL LOW (ref 39.0–52.0)
Hemoglobin: 8.5 g/dL — ABNORMAL LOW (ref 13.0–17.0)
Immature Granulocytes: 0 %
Lymphocytes Relative: 56 %
Lymphs Abs: 7.2 10*3/uL — ABNORMAL HIGH (ref 0.7–4.0)
MCH: 37.4 pg — ABNORMAL HIGH (ref 26.0–34.0)
MCHC: 33.6 g/dL (ref 30.0–36.0)
MCV: 111.5 fL — ABNORMAL HIGH (ref 80.0–100.0)
Monocytes Absolute: 0.9 10*3/uL (ref 0.1–1.0)
Monocytes Relative: 7 %
Neutro Abs: 4.6 10*3/uL (ref 1.7–7.7)
Neutrophils Relative %: 35 %
Platelets: 396 10*3/uL (ref 150–400)
RBC: 2.27 MIL/uL — ABNORMAL LOW (ref 4.22–5.81)
RDW: 21.8 % — ABNORMAL HIGH (ref 11.5–15.5)
WBC: 13 10*3/uL — ABNORMAL HIGH (ref 4.0–10.5)
nRBC: 0 % (ref 0.0–0.2)

## 2020-04-11 LAB — COMPREHENSIVE METABOLIC PANEL
ALT: 18 U/L (ref 0–44)
AST: 24 U/L (ref 15–41)
Albumin: 2.6 g/dL — ABNORMAL LOW (ref 3.5–5.0)
Alkaline Phosphatase: 183 U/L — ABNORMAL HIGH (ref 38–126)
Anion gap: 7 (ref 5–15)
BUN: 23 mg/dL — ABNORMAL HIGH (ref 6–20)
CO2: 21 mmol/L — ABNORMAL LOW (ref 22–32)
Calcium: 8.3 mg/dL — ABNORMAL LOW (ref 8.9–10.3)
Chloride: 106 mmol/L (ref 98–111)
Creatinine, Ser: 2.43 mg/dL — ABNORMAL HIGH (ref 0.61–1.24)
GFR, Estimated: 34 mL/min — ABNORMAL LOW (ref >60)
Glucose, Bld: 102 mg/dL — ABNORMAL HIGH (ref 70–99)
Potassium: 4.4 mmol/L (ref 3.5–5.1)
Sodium: 134 mmol/L — ABNORMAL LOW (ref 135–145)
Total Bilirubin: 0.7 mg/dL (ref 0.3–1.2)
Total Protein: 6.3 g/dL — ABNORMAL LOW (ref 6.5–8.1)

## 2020-04-11 MED ORDER — HYDROMORPHONE 1 MG/ML IV SOLN
INTRAVENOUS | Status: DC
  Administered 2020-04-11: 14.4 mg via INTRAVENOUS
  Administered 2020-04-12: 30 mg via INTRAVENOUS
  Administered 2020-04-12: 6.6 mg via INTRAVENOUS
  Administered 2020-04-12: 4.8 mg via INTRAVENOUS
  Filled 2020-04-11: qty 30

## 2020-04-11 NOTE — Progress Notes (Signed)
Subjective: 40 year old admitted with sickle cell painful crisis.  Patient also has acute on chronic kidney disease, leukocytosis and thrombocytosis.  He is currently on Dilaudid PCA only with long-acting home regimen.  He is not on any Toradol or ibuprofen due to chronic kidney disease.  Patient is on 0.5 mg bolus dose and complains that pain is barely improved.  Currently at 8 out of 10.  No nausea vomiting or diarrhea.  Pain is localized to anterior chest and lower legs.  Objective: Vital signs in last 24 hours: Temp:  [97.3 F (36.3 C)-98.1 F (36.7 C)] 98.1 F (36.7 C) (02/27 0959) Pulse Rate:  [84-100] 86 (02/27 0959) Resp:  [14-18] 15 (02/27 0959) BP: (107-117)/(60-90) 110/72 (02/27 0959) SpO2:  [97 %-100 %] 99 % (02/27 0959) FiO2 (%):  [0 %] 0 % (02/26 1601) Weight:  [60.1 kg] 60.1 kg (02/27 0437) Weight change:  Last BM Date:  (PTA)  Intake/Output from previous day: 02/26 0701 - 02/27 0700 In: 1252.3 [P.O.:240; I.V.:1012.3] Out: 850 [Urine:850] Intake/Output this shift: No intake/output data recorded.  General appearance: alert, cooperative, appears stated age and no distress Neck: no adenopathy, no carotid bruit, no JVD, supple, symmetrical, trachea midline and thyroid not enlarged, symmetric, no tenderness/mass/nodules Back: symmetric, no curvature. ROM normal. No CVA tenderness. Resp: clear to auscultation bilaterally Cardio: regular rate and rhythm, S1, S2 normal, no murmur, click, rub or gallop GI: soft, non-tender; bowel sounds normal; no masses,  no organomegaly Extremities: extremities normal, atraumatic, no cyanosis or edema Pulses: 2+ and symmetric Skin: Skin color, texture, turgor normal. No rashes or lesions Neurologic: Grossly normal  Lab Results: Recent Labs    04/10/20 0857 04/11/20 0540  WBC 12.5* 13.0*  HGB 9.2* 8.5*  HCT 26.1* 25.3*  PLT 421* 396   BMET Recent Labs    04/10/20 0857 04/11/20 0540  NA 138 134*  K 4.2 4.4  CL 103 106  CO2  25 21*  GLUCOSE 132* 102*  BUN 28* 23*  CREATININE 2.77* 2.43*  CALCIUM 8.1* 8.3*    Studies/Results: VAS [place] LOWER EXTREMITY VENOUS (DVT) (ONLY [facility] & [facility] 7a-7p)  Result Date: 04/10/2020  Lower Venous DVT Study Indications: Swelling in bilateral knees.  Risk Factors: Sickle cell. Comparison Study: No prior studies. Performing Technologist: Darlin Coco RDMS, RVT  Examination Guidelines: A complete evaluation includes B-mode imaging, spectral Doppler, color Doppler, and power Doppler as needed of all accessible portions of each vessel. Bilateral testing is considered an integral part of a complete examination. Limited examinations for reoccurring indications may be performed as noted. The reflux portion of the exam is performed with the patient in reverse Trendelenburg.  +---------+---------------+---------+-----------+----------+--------------+ RIGHT    CompressibilityPhasicitySpontaneityPropertiesThrombus Aging +---------+---------------+---------+-----------+----------+--------------+ CFV      Full           Yes      Yes                                 +---------+---------------+---------+-----------+----------+--------------+ SFJ      Full                                                        +---------+---------------+---------+-----------+----------+--------------+ FV Prox  Full                                                        +---------+---------------+---------+-----------+----------+--------------+  FV Mid   Full                                                        +---------+---------------+---------+-----------+----------+--------------+ FV DistalFull                                                        +---------+---------------+---------+-----------+----------+--------------+ PFV      Full                                                        +---------+---------------+---------+-----------+----------+--------------+ POP      Full            Yes      Yes                                 +---------+---------------+---------+-----------+----------+--------------+ PTV      Full                                                        +---------+---------------+---------+-----------+----------+--------------+ PERO     Full                                                        +---------+---------------+---------+-----------+----------+--------------+   +---------+---------------+---------+-----------+----------+--------------+ LEFT     CompressibilityPhasicitySpontaneityPropertiesThrombus Aging +---------+---------------+---------+-----------+----------+--------------+ CFV      Full           Yes      Yes                                 +---------+---------------+---------+-----------+----------+--------------+ SFJ      Full                                                        +---------+---------------+---------+-----------+----------+--------------+ FV Prox  Full                                                        +---------+---------------+---------+-----------+----------+--------------+ FV Mid   Full                                                        +---------+---------------+---------+-----------+----------+--------------+  FV DistalFull                                                        +---------+---------------+---------+-----------+----------+--------------+ PFV      Full                                                        +---------+---------------+---------+-----------+----------+--------------+ POP      Full           Yes      Yes                                 +---------+---------------+---------+-----------+----------+--------------+ PTV      Full                                                        +---------+---------------+---------+-----------+----------+--------------+ PERO     Full                                                         +---------+---------------+---------+-----------+----------+--------------+     Summary: RIGHT: - There is no evidence of deep vein thrombosis in the lower extremity.  - A cystic structure is found in the popliteal fossa.  LEFT: - There is no evidence of deep vein thrombosis in the lower extremity.  - A cystic structure is found in the popliteal fossa. - Ultrasound characteristics of enlarged lymph nodes noted in the groin.  *See table(s) above for measurements and observations.    Preliminary    VAS [place] UPPER EXTREMITY VENOUS DUPLEX  Result Date: 04/10/2020 UPPER VENOUS STUDY  Indications: Swelling Risk Factors: Sickle Cell. Comparison Study: No prior studies. Performing Technologist: Darlin Coco RDMS, RVT  Examination Guidelines: A complete evaluation includes B-mode imaging, spectral Doppler, color Doppler, and power Doppler as needed of all accessible portions of each vessel. Bilateral testing is considered an integral part of a complete examination. Limited examinations for reoccurring indications may be performed as noted.  Left Findings: +----------+------------+---------+-----------+----------+--------------+ LEFT      CompressiblePhasicitySpontaneousProperties   Summary     +----------+------------+---------+-----------+----------+--------------+ IJV           Full       Yes       Yes                             +----------+------------+---------+-----------+----------+--------------+ Subclavian    Full       Yes       Yes                             +----------+------------+---------+-----------+----------+--------------+ Axillary      Full       Yes  Yes                             +----------+------------+---------+-----------+----------+--------------+ Brachial      Full                                                 +----------+------------+---------+-----------+----------+--------------+ Radial        Full                                                  +----------+------------+---------+-----------+----------+--------------+ Ulnar         Full                                                 +----------+------------+---------+-----------+----------+--------------+ Cephalic                                            Not visualized +----------+------------+---------+-----------+----------+--------------+ Basilic       Full                                                 +----------+------------+---------+-----------+----------+--------------+  Summary:  Left: No evidence of deep vein thrombosis in the upper extremity. No evidence of superficial vein thrombosis in the upper extremity. However, unable to visualize the cephalic vein.  *See table(s) above for measurements and observations.    Preliminary     Medications: I have reviewed the patient's current medications.  Assessment/Plan: 40 year old here with sickle cell painful crisis.  #1 sickle cell painful crisis: Patient will be maintained on D5 half-normal at 100 cc an hour.  Dilaudid PCA will be changed to bolus dose of 0.6 mg with a maximum 3.6 mg/h.  He is not on NSAIDs so he needs a little bit more.  If pain persists we may give him some bolus doses.  Continue to reassess his pain.  #2 anemia of chronic disease: H&H some how close to baseline.  Continue monitoring.  #3 chronic kidney disease stage IV: BUN/creatinine appears to be close to his baseline.  Continue monitoring.  #4 chronic pain syndrome: Continue home regimen.  #5 tobacco abuse: Counseling provided.  LOS: 1 day   GARBA,LAWAL 04/11/2020, 11:54 AM

## 2020-04-12 DIAGNOSIS — D5700 Hb-SS disease with crisis, unspecified: Principal | ICD-10-CM

## 2020-04-12 DIAGNOSIS — N184 Chronic kidney disease, stage 4 (severe): Secondary | ICD-10-CM

## 2020-04-12 DIAGNOSIS — G894 Chronic pain syndrome: Secondary | ICD-10-CM | POA: Diagnosis not present

## 2020-04-12 MED ORDER — OXYCODONE HCL ER 15 MG PO T12A
15.0000 mg | EXTENDED_RELEASE_TABLET | Freq: Two times a day (BID) | ORAL | Status: DC
  Administered 2020-04-12 – 2020-04-13 (×3): 15 mg via ORAL
  Filled 2020-04-12 (×3): qty 1

## 2020-04-12 MED ORDER — HYDROMORPHONE 1 MG/ML IV SOLN
INTRAVENOUS | Status: DC
Start: 2020-04-12 — End: 2020-04-13
  Administered 2020-04-12: 4.5 mg via INTRAVENOUS
  Administered 2020-04-13: 30 mg via INTRAVENOUS
  Administered 2020-04-13: 5 mg via INTRAVENOUS
  Administered 2020-04-13: 2.5 mg via INTRAVENOUS
  Administered 2020-04-13: 5 mg via INTRAVENOUS
  Filled 2020-04-12: qty 30

## 2020-04-12 MED ORDER — OXYCODONE HCL 5 MG PO TABS
10.0000 mg | ORAL_TABLET | Freq: Four times a day (QID) | ORAL | Status: DC | PRN
  Administered 2020-04-12 – 2020-04-13 (×4): 10 mg via ORAL
  Filled 2020-04-12 (×4): qty 2

## 2020-04-12 NOTE — Progress Notes (Signed)
Subjective: [name] [name] is a 40 year old male with a medical history significant for sickle cell anemia type SS, chronic kidney disease stage IV, history of symptomatic anemia, opiate dependence and tolerance, and history of polysubstance abuse was admitted for sickle cell pain crisis.  Patient says that he continues to have pain and right lower extremity, especially right knee. He rates pain as 7/10. He denies any headache, chest pain, urinary symptoms, nausea, vomiting, or diarrhea.  Objective:  Vital signs in last 24 hours:  Vitals:   04/12/20 0539 04/12/20 0807 04/12/20 0952 04/12/20 1123  BP: 100/70  101/65   Pulse: 92  85   Resp: '18 12 12 10  '$ Temp: 98.3 F (36.8 C)  98 F (36.7 C)   TempSrc: Oral  Oral   SpO2: 99% 99% 96% 99%  Weight:      Height:        Intake/Output from previous day:   Intake/Output Summary (Last 24 hours) at 04/12/2020 1320 Last data filed at 04/12/2020 1100 Gross per 24 hour  Intake 1375.8 ml  Output 3400 ml  Net -2024.2 ml    Physical Exam: General: Alert, awake, oriented x3, in no acute distress.  HEENT: [place]/AT PEERL, EOMI Neck: Trachea midline,  no masses, no thyromegal,y no JVD, no carotid bruit OROPHARYNX:  Moist, No exudate/ erythema/lesions.  Heart: Regular rate and rhythm, without murmurs, rubs, gallops, PMI non-displaced, no heaves or thrills on palpation.  Lungs: Clear to auscultation, no wheezing or rhonchi noted. No increased vocal fremitus resonant to percussion  Abdomen: Soft, nontender, nondistended, positive bowel sounds, no masses no hepatosplenomegaly noted..  Neuro: No focal neurological deficits noted cranial nerves II through XII grossly intact. DTRs 2+ bilaterally upper and lower extremities. Strength 5 out of 5 in bilateral upper and lower extremities. Musculoskeletal: No warm swelling or erythema around joints, no spinal tenderness noted. Psychiatric: Patient alert and oriented x3, good insight and cognition, good  recent to remote recall. Lymph node survey: No cervical axillary or inguinal lymphadenopathy noted.  Lab Results:  Basic Metabolic Panel:    Component Value Date/Time   NA 134 (L) 04/11/2020 0540   NA 136 10/14/2019 1044   K 4.4 04/11/2020 0540   CL 106 04/11/2020 0540   CO2 21 (L) 04/11/2020 0540   BUN 23 (H) 04/11/2020 0540   BUN 38 (H) 10/14/2019 1044   CREATININE 2.43 (H) 04/11/2020 0540   CREATININE 1.45 (H) 05/12/2014 1430   GLUCOSE 102 (H) 04/11/2020 0540   CALCIUM 8.3 (L) 04/11/2020 0540   CBC:    Component Value Date/Time   WBC 13.0 (H) 04/11/2020 0540   HGB 8.5 (L) 04/11/2020 0540   HGB 6.7 (LL) 10/14/2019 1044   HCT 25.3 (L) 04/11/2020 0540   HCT 20.1 (L) 10/14/2019 1044   PLT 396 04/11/2020 0540   PLT 317 10/14/2019 1044   MCV 111.5 (H) 04/11/2020 0540   MCV 91 10/14/2019 1044   NEUTROABS 4.6 04/11/2020 0540   NEUTROABS 7.1 (H) 10/14/2019 1044   LYMPHSABS 7.2 (H) 04/11/2020 0540   LYMPHSABS 6.9 (H) 10/14/2019 1044   MONOABS 0.9 04/11/2020 0540   EOSABS 0.3 04/11/2020 0540   EOSABS 0.4 10/14/2019 1044   BASOSABS 0.0 04/11/2020 0540   BASOSABS 0.0 10/14/2019 1044    Recent Results (from the past 240 hour(s))  SARS CORONAVIRUS 2 (TAT 6-24 HRS) Nasopharyngeal Nasopharyngeal Swab     Status: None   Collection Time: 04/10/20  2:09 PM   Specimen: Nasopharyngeal Swab  Result  Value Ref Range Status   SARS Coronavirus 2 NEGATIVE NEGATIVE Final    Comment: (NOTE) SARS-CoV-2 target nucleic acids are NOT DETECTED.  The SARS-CoV-2 RNA is generally detectable in upper and lower respiratory specimens during the acute phase of infection. Negative results do not preclude SARS-CoV-2 infection, do not rule out co-infections with other pathogens, and should not be used as the sole basis for treatment or other patient management decisions. Negative results must be combined with clinical observations, patient history, and epidemiological information. The  expected result is Negative.  Fact Sheet for Patients: SugarRoll.be  Fact Sheet for Healthcare Providers: https://www.woods-mathews.com/  This test is not yet approved or cleared by the [place] FDA and  has been authorized for detection and/or diagnosis of SARS-CoV-2 by FDA under an Emergency Use Authorization (EUA). This EUA will remain  in effect (meaning this test can be used) for the duration of the COVID-19 declaration under Se ction 564(b)(1) of the Act, 21 U.S.C. section 360bbb-3(b)(1), unless the authorization is terminated or revoked sooner.  Performed at [facility], [place] [address], [place], [place] [address]     Studies/Results: No results found.  Medications: Scheduled Meds: . heparin  5,000 Units Subcutaneous Q8H  . HYDROmorphone   Intravenous Q4H  . oxyCODONE  15 mg Oral Q12H  . senna-docusate  1 tablet Oral BID   Continuous Infusions: . dextrose 5 % and 0.45% NaCl 50 mL/hr at 04/12/20 1317   PRN Meds:.diphenhydrAMINE, naloxone **AND** sodium chloride flush, ondansetron (ZOFRAN) IV, oxyCODONE, polyethylene glycol  Consultants:  None  Procedures:  None  Antibiotics:  None  Assessment/Plan: Active Problems:   CKD (chronic kidney disease), stage IV (HCC)   Sickle cell anemia with crisis (HCC)   GERD (gastroesophageal reflux disease)   Chronic pain syndrome  Sickle cell disease with pain crisis: Decrease IV fluids, 0.45% saline at 50 mL/h Continue IV Dilaudid PCA, decreased settings today Oxycodone 10 mg every 6 hours as needed Hold IV Toradol due to history of stage IV chronic kidney disease Monitor vital signs very closely, reevaluate pain scale regularly, and supplemental oxygen as needed.  Chronic pain syndrome: OxyContin 15 mg every 12 hours Oxycodone 10 mg every 6 hours as needed for severe breakthrough pain  Acute kidney injury superimposed on chronic kidney disease stage  IV: Creatinine 2.43, consistent with patient's baseline. Continue gentle hydration. Avoid all nephrotoxins. He is followed by nephrology as an outpatient.  Sickle cell anemia: Hemoglobin is 8.5, above patient's baseline. He received Aranesp 2 weeks ago and was recently started on Oxbryta by hematology. Continue to monitor closely. No blood transfusion warranted at this time. CBC in a.m.  Tobacco dependence: Smoking cessation instruction/counseling given:  counseled patient on the dangers of tobacco use, advised patient to stop smoking, and reviewed strategies to maximize success  Code Status: Full Code Family Communication: N/A Disposition Plan: Not yet ready for discharge  [place], MSN, FNP-C Patient [place] [place], [place] [address] [phone]  If 5PM-7AM, please contact night-coverage.  04/12/2020, 1:20 PM  LOS: 2 days

## 2020-04-13 ENCOUNTER — Other Ambulatory Visit (HOSPITAL_COMMUNITY): Payer: Self-pay | Admitting: Family Medicine

## 2020-04-13 DIAGNOSIS — D5700 Hb-SS disease with crisis, unspecified: Secondary | ICD-10-CM | POA: Diagnosis not present

## 2020-04-13 LAB — BASIC METABOLIC PANEL
Anion gap: 9 (ref 5–15)
BUN: 23 mg/dL — ABNORMAL HIGH (ref 6–20)
CO2: 18 mmol/L — ABNORMAL LOW (ref 22–32)
Calcium: 8.9 mg/dL (ref 8.9–10.3)
Chloride: 108 mmol/L (ref 98–111)
Creatinine, Ser: 2.54 mg/dL — ABNORMAL HIGH (ref 0.61–1.24)
GFR, Estimated: 32 mL/min — ABNORMAL LOW (ref >60)
Glucose, Bld: 99 mg/dL (ref 70–99)
Potassium: 5.2 mmol/L — ABNORMAL HIGH (ref 3.5–5.1)
Sodium: 135 mmol/L (ref 135–145)

## 2020-04-13 LAB — CBC
HCT: 25.9 % — ABNORMAL LOW (ref 39.0–52.0)
Hemoglobin: 8.7 g/dL — ABNORMAL LOW (ref 13.0–17.0)
MCH: 36.6 pg — ABNORMAL HIGH (ref 26.0–34.0)
MCHC: 33.6 g/dL (ref 30.0–36.0)
MCV: 108.8 fL — ABNORMAL HIGH (ref 80.0–100.0)
Platelets: 352 10*3/uL (ref 150–400)
RBC: 2.38 MIL/uL — ABNORMAL LOW (ref 4.22–5.81)
RDW: 22.1 % — ABNORMAL HIGH (ref 11.5–15.5)
WBC: 14.7 10*3/uL — ABNORMAL HIGH (ref 4.0–10.5)
nRBC: 0 % (ref 0.0–0.2)

## 2020-04-13 MED ORDER — OXYCODONE HCL 10 MG PO TABS: 10.0000 mg | ORAL_TABLET | Freq: Four times a day (QID) | ORAL | 0 refills | Status: DC | PRN

## 2020-04-13 NOTE — Care Management Important Message (Signed)
Important Message  Patient Details IM Letter given to the Patient. Name: [name] [name]. MRN: [id number] Date of Birth: Jul 03, 1980   Medicare Important Message Given:  Yes     Kerin Salen 04/13/2020, 10:21 AM

## 2020-04-13 NOTE — Discharge Instructions (Signed)
Sickle Cell Anemia, Adult  Sickle cell anemia is a condition where your red blood cells are shaped like sickles. Red blood cells carry oxygen through the body. Sickle-shaped cells do not live as long as normal red blood cells. They also clump together and block blood from flowing through the blood vessels. This prevents the body from getting enough oxygen. Sickle cell anemia causes organ damage and pain. It also increases the risk of infection. Follow these instructions at home: Medicines  Take over-the-counter and prescription medicines only as told by your doctor.  If you were prescribed an antibiotic medicine, take it as told by your doctor. Do not stop taking the antibiotic even if you start to feel better.  If you develop a fever, do not take medicines to lower the fever right away. Tell your doctor about the fever. Managing pain, stiffness, and swelling  Try these methods to help with pain: ? Use a heating pad. ? Take a warm bath. ? Distract yourself, such as by watching TV. Eating and drinking  Drink enough fluid to keep your pee (urine) clear or pale yellow. Drink more in hot weather and during exercise.  Limit or avoid alcohol.  Eat a healthy diet. Eat plenty of fruits, vegetables, whole grains, and lean protein.  Take vitamins and supplements as told by your doctor. Traveling  When traveling, keep these with you: ? Your medical information. ? The names of your doctors. ? Your medicines.  If you need to take an airplane, talk to your doctor first. Activity  Rest often.  Avoid exercises that make your heart beat much faster, such as jogging. General instructions  Do not use products that have nicotine or tobacco, such as cigarettes and e-cigarettes. If you need help quitting, ask your doctor.  Consider wearing a medical alert bracelet.  Avoid being in high places (high altitudes), such as mountains.  Avoid very hot or cold temperatures.  Avoid places where the  temperature changes a lot.  Keep all follow-up visits as told by your doctor. This is important. Contact a doctor if:  A joint hurts.  Your feet or hands hurt or swell.  You feel tired (fatigued). Get help right away if:  You have symptoms of infection. These include: ? Fever. ? Chills. ? Being very tired. ? Irritability. ? Poor eating. ? Throwing up (vomiting).  You feel dizzy or faint.  You have new stomach pain, especially on the left side.  You have a an erection (priapism) that lasts more than 4 hours.  You have numbness in your arms or legs.  You have a hard time moving your arms or legs.  You have trouble talking.  You have pain that does not go away when you take medicine.  You are short of breath.  You are breathing fast.  You have a long-term cough.  You have pain in your chest.  You have a bad headache.  You have a stiff neck.  Your stomach looks bloated even though you did not eat much.  Your skin is pale.  You suddenly cannot see well. Summary  Sickle cell anemia is a condition where your red blood cells are shaped like sickles.  Follow your doctor's advice on ways to manage pain, food to eat, activities to do, and steps to take for safe travel.  Get medical help right away if you have any signs of infection, such as a fever. This information is not intended to replace advice given to you by   your health care provider. Make sure you discuss any questions you have with your health care provider. Document Revised: 06/26/2019 Document Reviewed: 06/26/2019 Elsevier Patient Education  2021 [employer]  

## 2020-04-14 NOTE — Discharge Summary (Signed)
Physician Discharge Summary  [name] [name]. ID:[id number] DOB: 1981/01/22 DOA: 04/10/2020  PCP: [name], FNP  Admit date: 04/10/2020  Discharge date: 04/14/2020  Discharge Diagnoses:  Principal Problem:   Sickle cell anemia with crisis ([place]) Active Problems:   CKD (chronic kidney disease), stage IV ([facility])   GERD (gastroesophageal reflux disease)   Chronic pain syndrome   Discharge Condition: Stable  Disposition:   Follow-up Information    [name], FNP Follow up in 1 week(s).   Specialty: Family Medicine Contact information: [address]. [place] [address] [phone]        Buford Dresser, MD .   Specialty: Cardiology Contact information: [address] [place] [address] [phone]              Pt is discharged home in good condition and is to follow up with [name], FNP this week to have labs evaluated. [name] A Solan Jr. is instructed to increase activity slowly and balance with rest for the next few days, and use prescribed medication to complete treatment of pain  Diet: Regular Wt Readings from Last 3 Encounters:  04/13/20 54.1 kg  03/22/20 59 kg  03/16/20 58.1 kg    History of present illness:  [name] [name] is a 40 year old male with a medical history significant for sickle cell anemia type SS, chronic kidney disease stage IV, history of symptomatic anemia, opiate dependence and tolerance, and history of polysubstance abuse presented to the emergency department with persistent pain 10/10 primarily to low back and lower extremities.  Symptoms were consistent with his typical sickle cell pain crisis.  Patient attempted home pain medication without sustained relief.  He continued to have uncontrolled pain and was unable to function.  He denies any subjective fever, chills, nausea, vomiting, or diarrhea.  Patient received initial treatment in the emergency department with multiple  doses of IV Dilaudid without relief.  He is being admitted to hospital for further evaluation and treatment of sickle cell pain crisis.  ER course: Temperature 98.3 F, blood pressure 107/79, pulse 108, respirations 18, oxygen saturation 98% on room air.  Sodium 130, potassium 4.2, chloride 93 with CO2 25.  Glucose 133.  Creatinine is 2.77 and calcium 8.1.  WBC count 12.5, hemoglobin 9.2, and platelets 421.  COVID-19 screen is negative.  Venous Dopplers of both upper and lower extremity showed no acute findings of DVT.  Patient admitted for further management of sickle cell pain crisis.  Hospital Course:  Sickle cell disease with pain crisis Patient was admitted for sickle cell pain crisis and managed appropriately with IVF, IV Dilaudid via PCA  as well as other adjunct therapies per sickle cell pain management protocols.  IV Toradol held due to history of chronic kidney disease stage IV.  Patient's pain intensity is 3/10 he is requesting discharge home.  He was advised to resume home pain medication including OxyContin and oxycodone.  Patient will follow-up in 2 weeks for medication management.  At that time repeat CBC with differential and CMP. Sickle cell anemia: Hemoglobin 8.7 prior to discharge.  Patient is status post Aranesp 2 weeks ago.  He was also started on Oxbryta per hematology.  Patient advised to follow-up with hematologist as scheduled. Stage IV chronic kidney disease: Patient followed as an outpatient with nephrology.  Patient is alert, oriented, and ambulating without assistance.  He is afebrile with oxygen saturation of 98% on RA.  Patient was therefore discharged home  today in a hemodynamically stable condition.   [name] [name] will follow-up with PCP within 1 week of this discharge. [name] [name] was counseled extensively about nonpharmacologic means of pain management, patient verbalized understanding and was appreciative of  the care received during this admission.   We discussed the  need for good hydration, monitoring of hydration status, avoidance of heat, cold, stress, and infection triggers. We discussed the need to be adherent with taking Hydrea and other home medications. Patient was reminded of the need to seek medical attention immediately if any symptom of bleeding, anemia, or infection occurs.  Discharge Exam: Vitals:   04/13/20 0832 04/13/20 1045  BP:  123/73  Pulse:  95  Resp: 14 16  Temp:    SpO2:  100%   Vitals:   04/13/20 0547 04/13/20 0831 04/13/20 0832 04/13/20 1045  BP: 121/82   123/73  Pulse: 88   95  Resp: '16 14 14 16  '$ Temp: 98.6 F (37 C)     TempSrc: Oral     SpO2: 93% 93%  100%  Weight: 54.1 kg     Height:        General appearance : Awake, alert, not in any distress. Speech Clear. Not toxic looking HEENT: Atraumatic and Normocephalic, pupils equally reactive to light and accomodation Neck: Supple, no JVD. No cervical lymphadenopathy.  Chest: Good air entry bilaterally, no added sounds  CVS: S1 S2 regular, no murmurs.  Abdomen: Bowel sounds present, Non tender and not distended with no gaurding, rigidity or rebound. Extremities: B/L Lower Ext shows no edema, both legs are warm to touch Neurology: Awake alert, and oriented X 3, CN II-XII intact, Non focal Skin: No Rash  Discharge Instructions  Discharge Instructions    Discharge patient   Complete by: As directed    Discharge disposition: 01-Home or Self Care   Discharge patient date: 04/13/2020     Allergies as of 04/13/2020      Reactions   Nsaids Other (See Comments)   Kidney disease   Morphine And Related Other (See Comments)   "Real bad headaches"       Medication List    TAKE these medications   acetaminophen 500 MG tablet Commonly known as: TYLENOL Take 1 tablet (500 mg total) by mouth every 6 (six) hours as needed. What changed: reasons to take this   allopurinol 100 MG tablet Commonly known as: ZYLOPRIM Take 100 mg by mouth daily.   amoxicillin-clavulanate  875-125 MG tablet Commonly known as: AUGMENTIN Take 1 tablet by mouth every 12 (twelve) hours. What changed: when to take this   folic acid 1 MG tablet Commonly known as: FOLVITE TAKE 1 TABLET BY MOUTH EVERY DAY   Lokelma 10 g Pack packet Generic drug: sodium zirconium cyclosilicate Take 10 g by mouth every other day.   nortriptyline 25 MG capsule Commonly known as: PAMELOR TAKE 1 CAPSULE BY MOUTH 2 TIMES DAILY.   Oxbryta 500 MG Tabs tablet Generic drug: voxelotor Take 500 mg by mouth daily.   oxyCODONE 15 mg 12 hr tablet Commonly known as: OxyCONTIN Take 1 tablet (15 mg total) by mouth every 12 (twelve) hours. What changed: Another medication with the same name was changed. Make sure you understand how and when to take each.   Oxycodone HCl 10 MG Tabs Take 1 tablet (10 mg total) by mouth every 6 (six) hours as needed. Start taking on: April 15, 2020 What changed: These instructions start on April 15, 2020. If you are unsure  what to do until then, ask your doctor or other care provider.   sodium bicarbonate 650 MG tablet Take 650 mg by mouth 2 (two) times daily.   Vitamin D3 50 MCG (2000 UT) Tabs Take 1 each by mouth daily. What changed: how much to take       The results of significant diagnostics from this hospitalization (including imaging, microbiology, ancillary and laboratory) are listed below for reference.    Significant Diagnostic Studies: CT HEAD WO CONTRAST  Result Date: 03/23/2020 CLINICAL DATA:  Headache EXAM: CT HEAD WITHOUT CONTRAST TECHNIQUE: Contiguous axial images were obtained from the base of the skull through the vertex without intravenous contrast. COMPARISON:  01/06/2019 FINDINGS: Brain: Large CSF collections seen in the temporal regions bilaterally, left greater than right, stable since prior study. Diffuse cerebral atrophy, advanced for patient's age. No acute infarction, hemorrhage or hydrocephalus. Vascular: No hyperdense vessel or unexpected  calcification. Skull: No acute calvarial abnormality. Sinuses/Orbits: Air-fluid levels throughout the paranasal sinuses diffusely. Mastoid air cells clear. Other: None IMPRESSION: Stable CSF collections in the temporal regions bilaterally, left larger than right. Age advanced atrophy. No acute intracranial abnormality. Diffuse acute on chronic sinusitis. Electronically Signed   By: Rolm Baptise M.D.   On: 03/23/2020 01:15   VAS [place] LOWER EXTREMITY VENOUS (DVT) (ONLY [facility] & [facility] 7a-7p)  Result Date: 04/12/2020  Lower Venous DVT Study Indications: Swelling in bilateral knees.  Risk Factors: Sickle cell. Comparison Study: No prior studies. Performing Technologist: Darlin Coco RDMS, RVT  Examination Guidelines: A complete evaluation includes B-mode imaging, spectral Doppler, color Doppler, and power Doppler as needed of all accessible portions of each vessel. Bilateral testing is considered an integral part of a complete examination. Limited examinations for reoccurring indications may be performed as noted. The reflux portion of the exam is performed with the patient in reverse Trendelenburg.  +---------+---------------+---------+-----------+----------+--------------+ RIGHT    CompressibilityPhasicitySpontaneityPropertiesThrombus Aging +---------+---------------+---------+-----------+----------+--------------+ CFV      Full           Yes      Yes                                 +---------+---------------+---------+-----------+----------+--------------+ SFJ      Full                                                        +---------+---------------+---------+-----------+----------+--------------+ FV Prox  Full                                                        +---------+---------------+---------+-----------+----------+--------------+ FV Mid   Full                                                        +---------+---------------+---------+-----------+----------+--------------+ FV  DistalFull                                                        +---------+---------------+---------+-----------+----------+--------------+  PFV      Full                                                        +---------+---------------+---------+-----------+----------+--------------+ POP      Full           Yes      Yes                                 +---------+---------------+---------+-----------+----------+--------------+ PTV      Full                                                        +---------+---------------+---------+-----------+----------+--------------+ PERO     Full                                                        +---------+---------------+---------+-----------+----------+--------------+   +---------+---------------+---------+-----------+----------+--------------+ LEFT     CompressibilityPhasicitySpontaneityPropertiesThrombus Aging +---------+---------------+---------+-----------+----------+--------------+ CFV      Full           Yes      Yes                                 +---------+---------------+---------+-----------+----------+--------------+ SFJ      Full                                                        +---------+---------------+---------+-----------+----------+--------------+ FV Prox  Full                                                        +---------+---------------+---------+-----------+----------+--------------+ FV Mid   Full                                                        +---------+---------------+---------+-----------+----------+--------------+ FV DistalFull                                                        +---------+---------------+---------+-----------+----------+--------------+ PFV      Full                                                        +---------+---------------+---------+-----------+----------+--------------+  POP      Full           Yes      Yes                                  +---------+---------------+---------+-----------+----------+--------------+ PTV      Full                                                        +---------+---------------+---------+-----------+----------+--------------+ PERO     Full                                                        +---------+---------------+---------+-----------+----------+--------------+     Summary: RIGHT: - There is no evidence of deep vein thrombosis in the lower extremity.  - A cystic structure is found in the popliteal fossa.  LEFT: - There is no evidence of deep vein thrombosis in the lower extremity.  - A cystic structure is found in the popliteal fossa. - Ultrasound characteristics of enlarged lymph nodes noted in the groin.  *See table(s) above for measurements and observations. Electronically signed by Servando Snare MD on 04/12/2020 at 6:02:15 PM.    Final    VAS [place] UPPER EXTREMITY VENOUS DUPLEX  Result Date: 04/12/2020 UPPER VENOUS STUDY  Indications: Swelling Risk Factors: Sickle Cell. Comparison Study: No prior studies. Performing Technologist: Darlin Coco RDMS, RVT  Examination Guidelines: A complete evaluation includes B-mode imaging, spectral Doppler, color Doppler, and power Doppler as needed of all accessible portions of each vessel. Bilateral testing is considered an integral part of a complete examination. Limited examinations for reoccurring indications may be performed as noted.  Left Findings: +----------+------------+---------+-----------+----------+--------------+ LEFT      CompressiblePhasicitySpontaneousProperties   Summary     +----------+------------+---------+-----------+----------+--------------+ IJV           Full       Yes       Yes                             +----------+------------+---------+-----------+----------+--------------+ Subclavian    Full       Yes       Yes                              +----------+------------+---------+-----------+----------+--------------+ Axillary      Full       Yes       Yes                             +----------+------------+---------+-----------+----------+--------------+ Brachial      Full                                                 +----------+------------+---------+-----------+----------+--------------+ Radial        Full                                                 +----------+------------+---------+-----------+----------+--------------+  Ulnar         Full                                                 +----------+------------+---------+-----------+----------+--------------+ Cephalic                                            Not visualized +----------+------------+---------+-----------+----------+--------------+ Basilic       Full                                                 +----------+------------+---------+-----------+----------+--------------+  Summary:  Left: No evidence of deep vein thrombosis in the upper extremity. No evidence of superficial vein thrombosis in the upper extremity. However, unable to visualize the cephalic vein.  *See table(s) above for measurements and observations.  Diagnosing physician: Servando Snare MD Electronically signed by Servando Snare MD on 04/12/2020 at 6:02:24 PM.    Final     Microbiology: Recent Results (from the past 240 hour(s))  SARS CORONAVIRUS 2 (TAT 6-24 HRS) Nasopharyngeal Nasopharyngeal Swab     Status: None   Collection Time: 04/10/20  2:09 PM   Specimen: Nasopharyngeal Swab  Result Value Ref Range Status   SARS Coronavirus 2 NEGATIVE NEGATIVE Final    Comment: (NOTE) SARS-CoV-2 target nucleic acids are NOT DETECTED.  The SARS-CoV-2 RNA is generally detectable in upper and lower respiratory specimens during the acute phase of infection. Negative results do not preclude SARS-CoV-2 infection, do not rule out co-infections with other pathogens, and should not  be used as the sole basis for treatment or other patient management decisions. Negative results must be combined with clinical observations, patient history, and epidemiological information. The expected result is Negative.  Fact Sheet for Patients: SugarRoll.be  Fact Sheet for Healthcare Providers: https://www.woods-mathews.com/  This test is not yet approved or cleared by the [place] FDA and  has been authorized for detection and/or diagnosis of SARS-CoV-2 by FDA under an Emergency Use Authorization (EUA). This EUA will remain  in effect (meaning this test can be used) for the duration of the COVID-19 declaration under Se ction 564(b)(1) of the Act, 21 U.S.C. section 360bbb-3(b)(1), unless the authorization is terminated or revoked sooner.  Performed at [facility], [place] [address], [place], [place] [address]      Labs: Basic Metabolic Panel: Recent Labs  Lab 04/10/20 0857 04/11/20 0540 04/13/20 0858  NA 138 134* 135  K 4.2 4.4 5.2*  CL 103 106 108  CO2 25 21* 18*  GLUCOSE 132* 102* 99  BUN 28* 23* 23*  CREATININE 2.77* 2.43* 2.54*  CALCIUM 8.1* 8.3* 8.9   Liver Function Tests: Recent Labs  Lab 04/10/20 0857 04/11/20 0540  AST 32 24  ALT 22 18  ALKPHOS 208* 183*  BILITOT 1.0 0.7  PROT 6.9 6.3*  ALBUMIN 2.7* 2.6*   No results for input(s): LIPASE, AMYLASE in the last 168 hours. No results for input(s): AMMONIA in the last 168 hours. CBC: Recent Labs  Lab 04/10/20 0857 04/11/20 0540 04/13/20 0858  WBC 12.5* 13.0* 14.7*  NEUTROABS 6.1 4.6  --  HGB 9.2* 8.5* 8.7*  HCT 26.1* 25.3* 25.9*  MCV 110.6* 111.5* 108.8*  PLT 421* 396 352   Cardiac Enzymes: No results for input(s): CKTOTAL, CKMB, CKMBINDEX, TROPONINI in the last 168 hours. BNP: Invalid input(s): POCBNP CBG: No results for input(s): GLUCAP in the last 168 hours.  Time coordinating discharge: 35 minutes  Signed:  [name]   APRN, MSN, FNP-C [facility] [place], [place] [address] [phone]  [facility] 04/14/2020, 4:53 PM

## 2020-04-15 MED FILL — oxyCODONE HCL 10 MG TABS: 10 | 15 days supply | Qty: 60 | Fill #0

## 2020-04-20 ENCOUNTER — Encounter: Payer: Self-pay | Admitting: Family Medicine

## 2020-04-20 ENCOUNTER — Telehealth (INDEPENDENT_AMBULATORY_CARE_PROVIDER_SITE_OTHER): Payer: 59 | Admitting: Family Medicine

## 2020-04-20 VITALS — Ht 63.0 in | Wt 119.0 lb

## 2020-04-20 DIAGNOSIS — E559 Vitamin D deficiency, unspecified: Secondary | ICD-10-CM | POA: Diagnosis not present

## 2020-04-20 DIAGNOSIS — N184 Chronic kidney disease, stage 4 (severe): Secondary | ICD-10-CM

## 2020-04-20 DIAGNOSIS — D5700 Hb-SS disease with crisis, unspecified: Secondary | ICD-10-CM | POA: Diagnosis not present

## 2020-04-20 DIAGNOSIS — F1190 Opioid use, unspecified, uncomplicated: Secondary | ICD-10-CM | POA: Diagnosis not present

## 2020-04-20 DIAGNOSIS — F1123 Opioid dependence with withdrawal: Secondary | ICD-10-CM

## 2020-04-20 NOTE — Progress Notes (Signed)
Virtual Visit via Telephone Note  I connected with [name] [name]. on 04/20/20 at  2:00 PM EST by telephone and verified that I am speaking with the correct person using two identifiers.  Location: Patient: Home Provider: [facility]   I discussed the limitations, risks, security and privacy concerns of performing an evaluation and management service by telephone and the availability of in person appointments. I also discussed with the patient that there may be a patient responsible charge related to this service. The patient expressed understanding and agreed to proceed.   History of Present Illness: [name] [name] is a very pleasant 40 year old male with a medical history significant for sickle cell disease type SS, chronic kidney disease stage IV, history of symptomatic anemia, opiate dependence and tolerance, and history of polysubstance abuse presents via telephone for a post hospital follow up. [name] [name] was admitted to inpatient services on 04/10/2020 for sickle cell pain crisis. Patient says that pain is much improved on today.  He says that pain has been controlled on current medication regimen.  He has been taking all disease modifying agents consistently and hydrating. Patient is followed closely by hematology.  He was recently started on Oxbryta.  He states that he is tolerating medication well.  Patient's hemoglobin prior to discharge was 8.7.  Patient was previously receiving Aranesp injections, but will discontinue being that he is started [place]. He denies any headache, chest pain, shortness of breath, dizziness, urinary symptoms, nausea, vomiting, or diarrhea. Past Medical History:  Diagnosis Date   Abscess of right iliac fossa 09/24/2018   required Perc Drain 09/24/2018   Arachnoid Cyst of brain bilaterally    "2 really small ones in the back of my head; inside; saw them w/MRI" (09/25/2012)   Bacterial pneumonia ~ 2012   "caught it here in the  hospital" (09/25/2012)   Chronic kidney disease    "from my sickle cell" (09/25/2012)   CKD (chronic kidney disease), stage II    Colitis 04/19/2017   CT scan abd/ pelvis   GERD (gastroesophageal reflux disease)    "after I eat alot of spicey foods" (09/25/2012)   Gynecomastia, male 07/10/2012   History of blood transfusion    "always related to sickle cell crisis" (09/25/2012)   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   Noted in noted from Hematology notes at [facility]    "take RX qd to prevent them" (09/25/2012)   Opioid dependence with withdrawal ([place]) 08/30/2012   Renal insufficiency    Sickle cell anemia (H[facility])    Sickle cell crisis ([place]) 09/25/2012   Sickle cell nephropathy ([place]) 07/10/2012   Sinus tachycardia    Tachycardia with heart rate 121-140 beats per minute with ambulation 08/04/2016   Social History   Socioeconomic History   Marital status: Single    Spouse name: Not on file   Number of children: Not on file   Years of education: Not on file   Highest education level: Not on file  Occupational History   Not on file  Tobacco Use   Smoking status: Current Some Day Smoker    Packs/day: 0.10    Types: Cigarettes   Smokeless tobacco: Never Used   Tobacco comment: 09/25/2012 "I don't buy cigarettes; bum one from friends q now and then"  Vaping Use   Vaping Use: Never used  Substance and Sexual Activity   Alcohol use: Yes    Alcohol/week: 0.0 standard drinks  Comment: Once a month   Drug use: Yes    Types: Marijuana    Comment: Every 2-3 weeks    Sexual activity: Yes  Other Topics Concern   Not on file  Social History Narrative    Lives alone in [place].   Back at school, studying Public relations account executive. Unemployed.    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    Social  Determinants of Health   Financial Resource Strain: Not on file  Food Insecurity: Not on file  Transportation Needs: Not on file  Physical Activity: Not on file  Stress: Not on file  Social Connections: Not on file  Intimate Partner Violence: Not on file   Immunization History  Administered Date(s) Administered   Influenza Split 12/08/2011   Influenza,inj,Quad PF,6+ Mos 10/11/2012, 12/13/2016, 11/15/2017, 01/28/2019, 01/13/2020   Influenza-Unspecified 10/11/2012, 12/13/2016   Moderna Sars-Covid-2 Vaccination 05/29/2019   Pneumococcal Conjugate-13 08/23/2017   Pneumococcal Polysaccharide-23 12/13/2016   Td 09/18/2004   Tdap 06/13/2017   Review of Systems  Constitutional: Negative for chills and fever.  HENT: Negative.   Eyes: Negative.   Respiratory: Negative.   Cardiovascular: Negative.   Gastrointestinal: Negative.   Genitourinary: Negative.   Musculoskeletal: Positive for joint pain.  Skin: Negative.   Psychiatric/Behavioral: Negative.     Assessment and Plan:  Sickle-cell disease with pain ([place]) Sickle cell disease - Continue Hydrea. We discussed the need for good hydration, monitoring of hydration status, avoidance of heat, cold, stress, and infection triggers. We discussed the risks and benefits of Hydrea, including bone marrow suppression, the possibility of GI upset, skin ulcers, hair thinning, and teratogenicity. The patient was reminded of the need to seek medical attention of any symptoms of bleeding, anemia, or infection.  Patient was recently started on Oxbryta.  Patient's most recent hemoglobin is 8.7 g/dL.  His baseline is currently 8-9 g/dL.  Continue folic acid 1 mg daily to prevent aplastic bone marrow crises.   Pulmonary evaluation - Patient denies severe recurrent wheezes, shortness of breath with exercise, or persistent cough. If these symptoms develop, pulmonary function tests with spirometry will be ordered, and if abnormal, plan on referral to  Pulmonology for further evaluation.  Cardiac - Routine screening for pulmonary hypertension is not recommended.  Eye - High risk of proliferative retinopathy. Annual eye exam with retinal exam recommended to patient.  Patient encouraged to schedule first available eye exam.  Immunization status -[name] [name] is up-to-date with all vaccinations  Acute and chronic painful episodes -no changes in pain medication regimen. 7. Iron overload from chronic transfusion.  Exjade (dose and frequency)  8. Vitamin D deficiency - Drisdol 50,000 units weekly, we encouraged her to take it.   The above recommendations are taken from the NIH Evidence-Based Management of Sickle Cell Disease: Expert Panel Report, 20149.   Chronic medical problems including diabetes, hypertension and COPD. We recommended she try to find a PCP in [place].   CKD (chronic kidney disease), stage IV ([place]) Patient will follow-up in clinic for labs in 1 week.  He will call to schedule this lab appointment. -Sickle cell panel  Chronic, continuous use of opioids No changes in opiate medication regimen.  Reviewed PDMP  Follow Up Instructions:    I discussed the assessment and treatment plan with the patient. The patient was provided an opportunity to ask questions and  all were answered. The patient agreed with the plan and demonstrated an understanding of the instructions.   The patient was advised to call back or seek an in-person evaluation if the symptoms worsen or if the condition fails to improve as anticipated.  I provided 10 minutes of non-face-to-face time during this encounter.  [name]  APRN, MSN, FNP-C [facility] [address] [place], [place] [address] [phone]

## 2020-04-22 ENCOUNTER — Telehealth: Payer: Self-pay

## 2020-04-22 NOTE — Telephone Encounter (Signed)
Please call pt cause he's in pain and needs his medication before the due date.

## 2020-04-23 ENCOUNTER — Telehealth: Payer: Self-pay

## 2020-04-23 NOTE — Telephone Encounter (Signed)
Can you please verify this patient. I don's see any information on him. Thanks

## 2020-04-23 NOTE — Telephone Encounter (Signed)
Med refill Oxycodone 15 mg

## 2020-04-26 ENCOUNTER — Other Ambulatory Visit: Payer: Self-pay

## 2020-04-26 DIAGNOSIS — G894 Chronic pain syndrome: Secondary | ICD-10-CM

## 2020-04-26 IMAGING — CR DG CHEST 2V
2 series · 2 of 2 positions shown · non-contrast
Comparison: 07/22/2018 and 10/16/2016

CLINICAL DATA: Chest pain which shortness-of-breath and dry cough
since yesterday.

EXAM:
CHEST - 2 VIEW

[w chest pa]
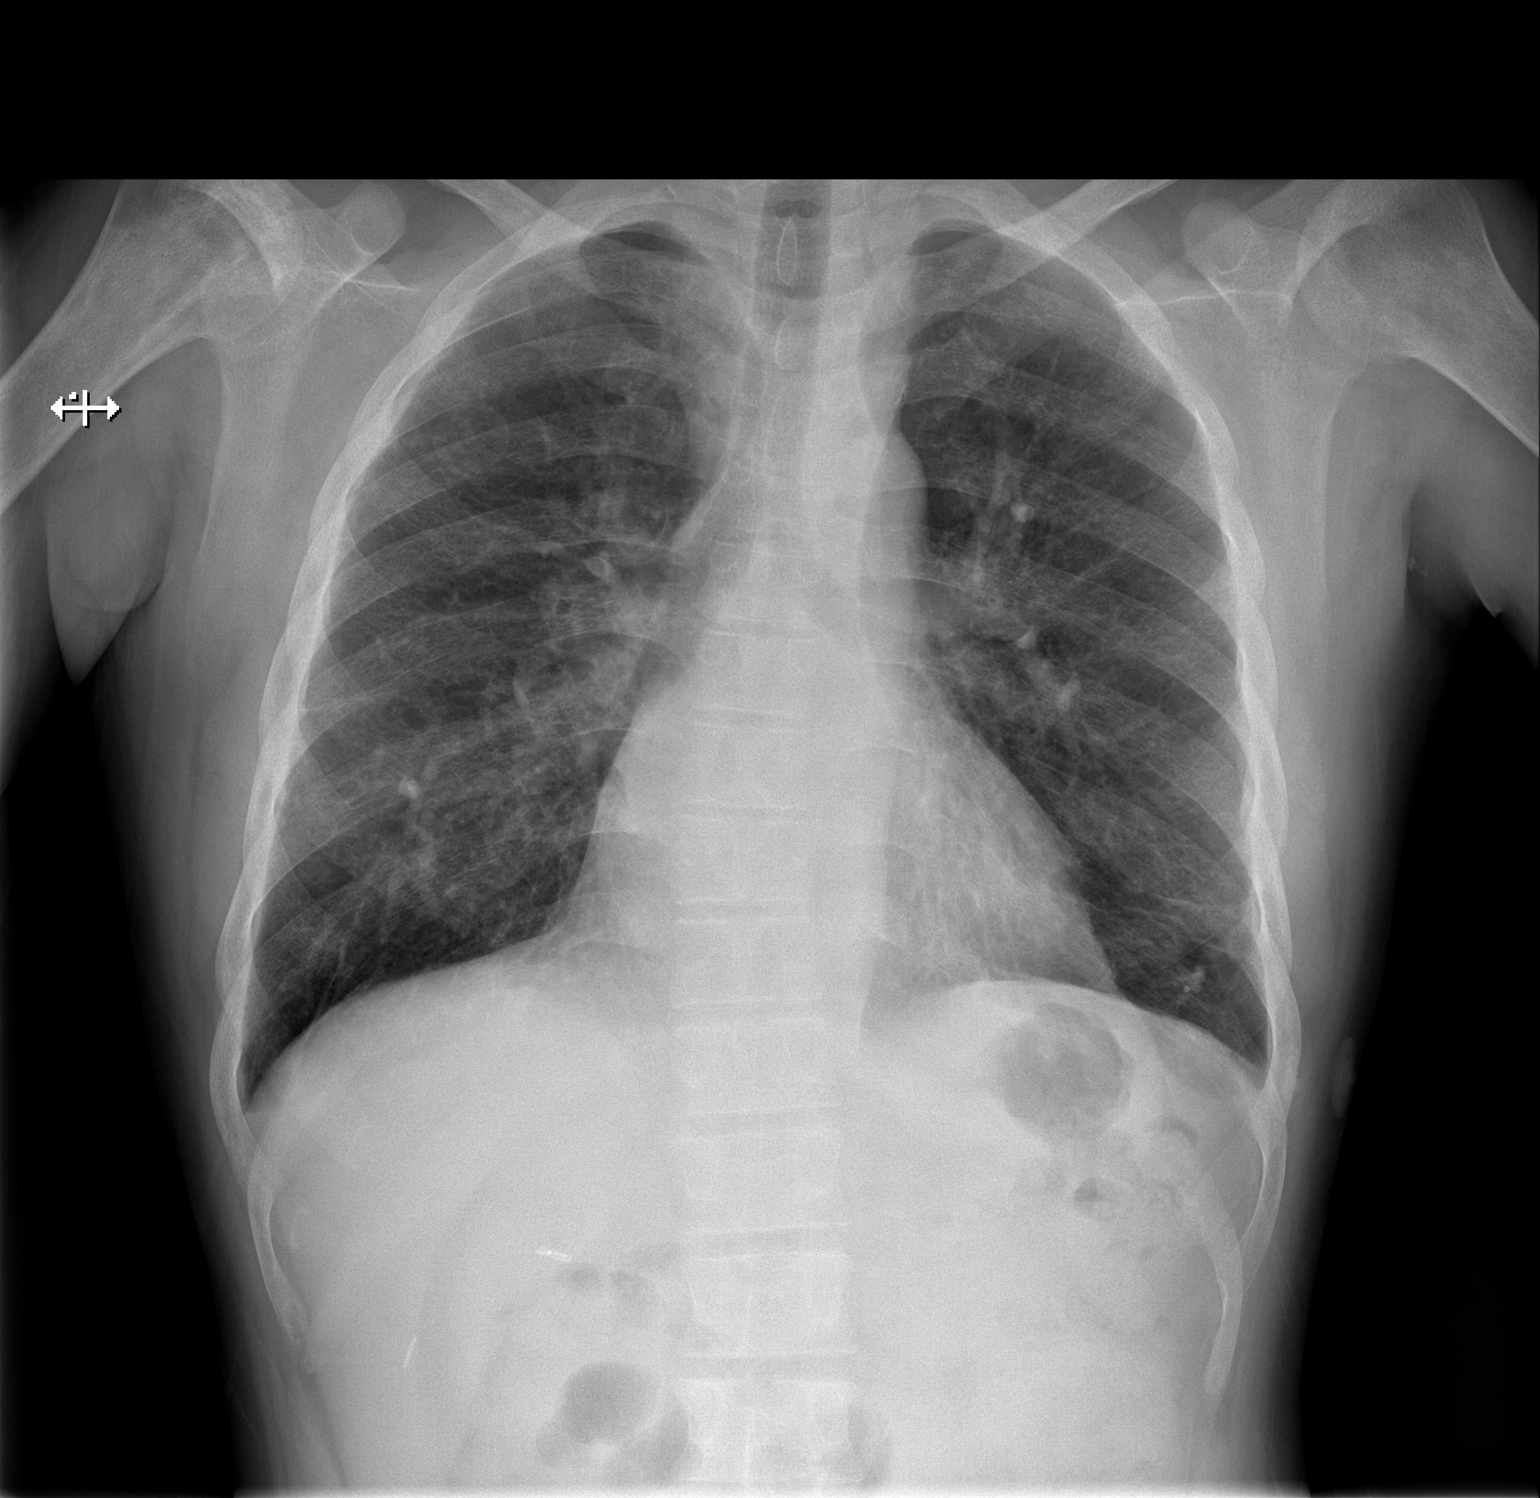

[w chest lat]
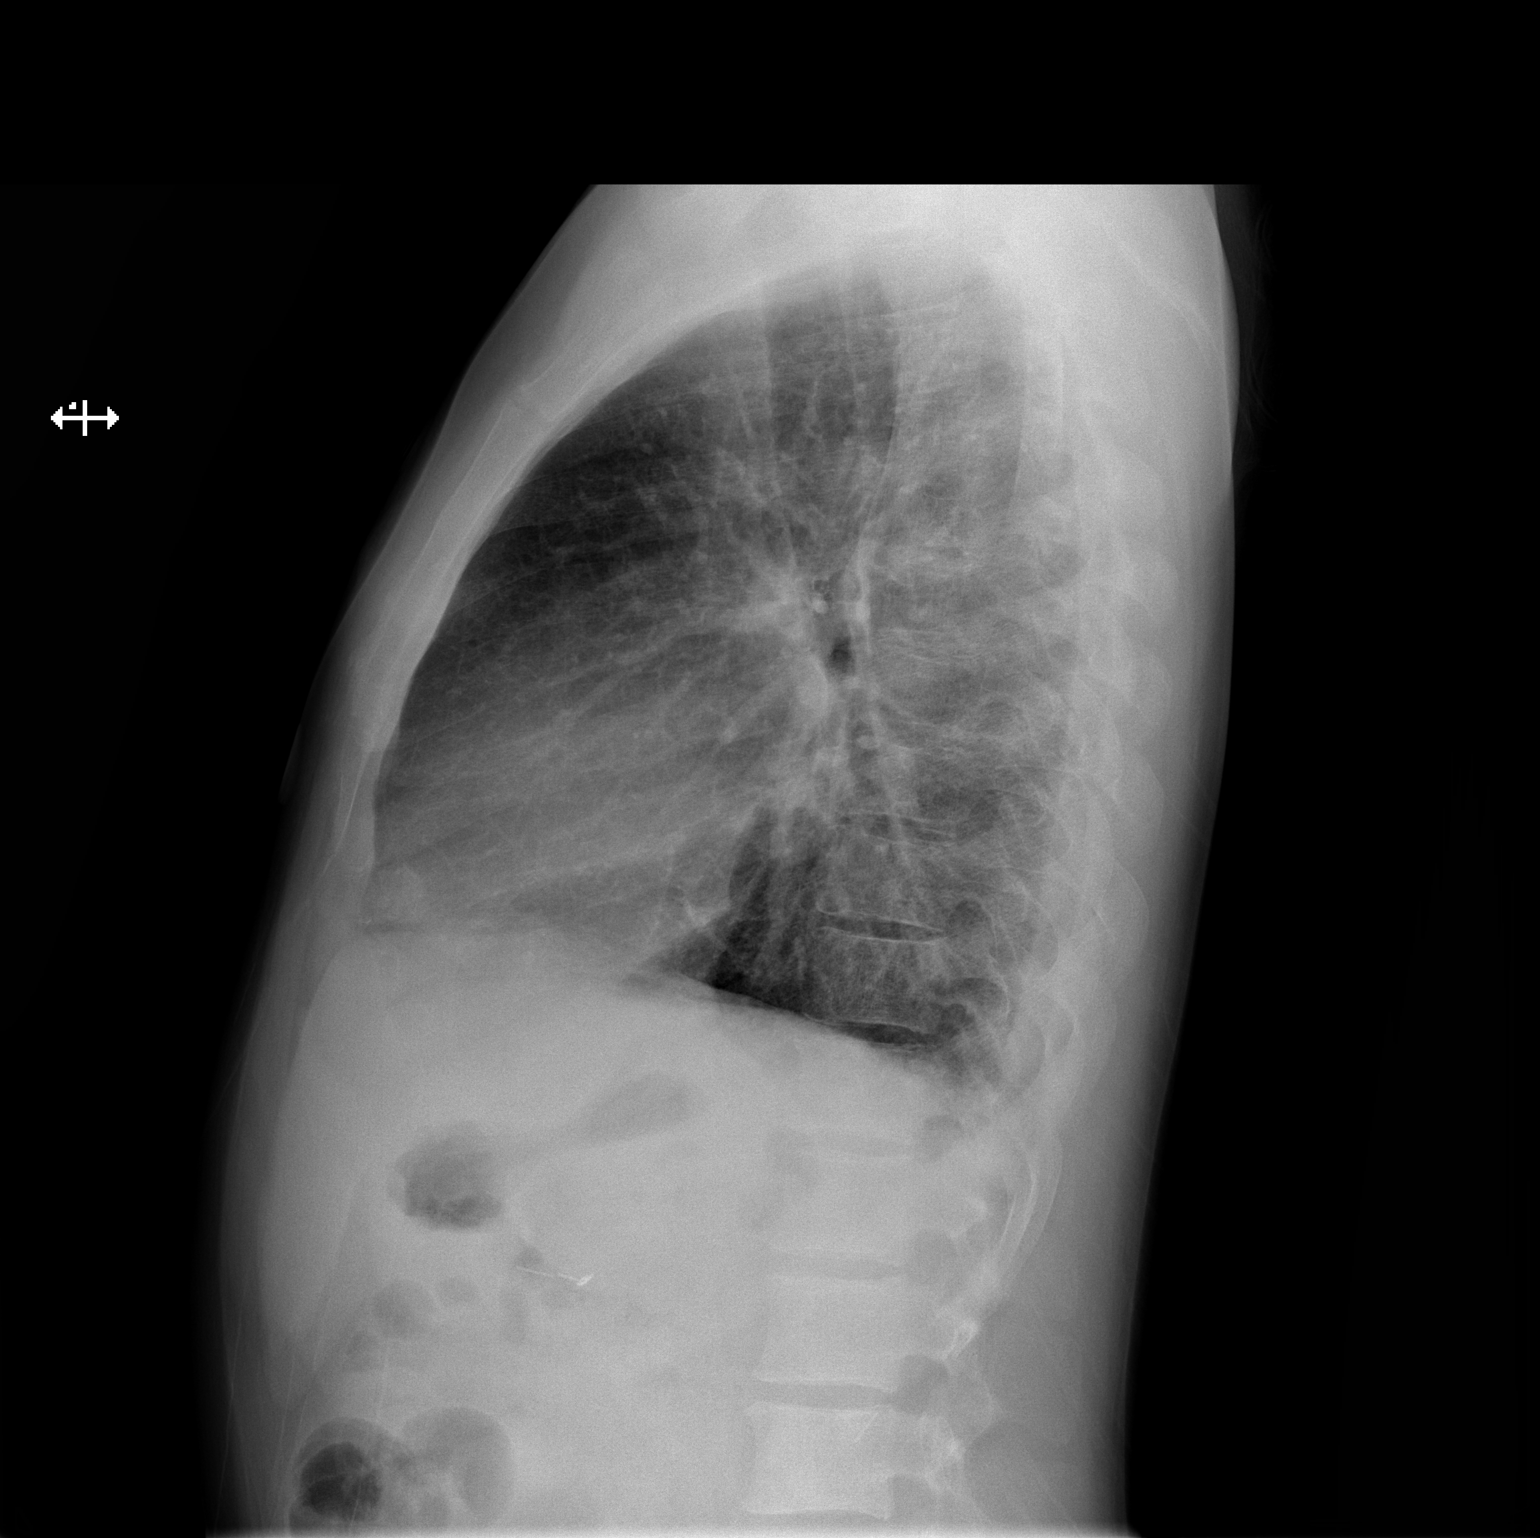

[2 of 2 positions shown; findings below may reference images not displayed]

FINDINGS: Lungs are adequately inflated with chronic stable hazy prominence of
the perihilar vessels. No lobar consolidation or effusion. Small
nodule opacity over the lateral left base unchanged. Subtle nodular
opacity over the anterior lung bases on the lateral film not well
localized on the frontal film. Flattening of the hemidiaphragms on
the lateral film. Cardiomediastinal silhouette and remainder of the
exam is unchanged.
IMPRESSION: No acute cardiopulmonary disease.

Subtle nodular density over the anterior lung bases on the lateral
film. Recommend follow-up chest radiograph 3-4 weeks versus
noncontrast chest CT for further evaluation.

## 2020-04-26 IMAGING — CR DG FOOT 2V*L*
2 series · 2 of 2 positions shown · non-contrast
Comparison: 11/02/2018

CLINICAL DATA: Possible sickle cell crisis

EXAM:
LEFT FOOT - 2 VIEW

[x foot ap left]
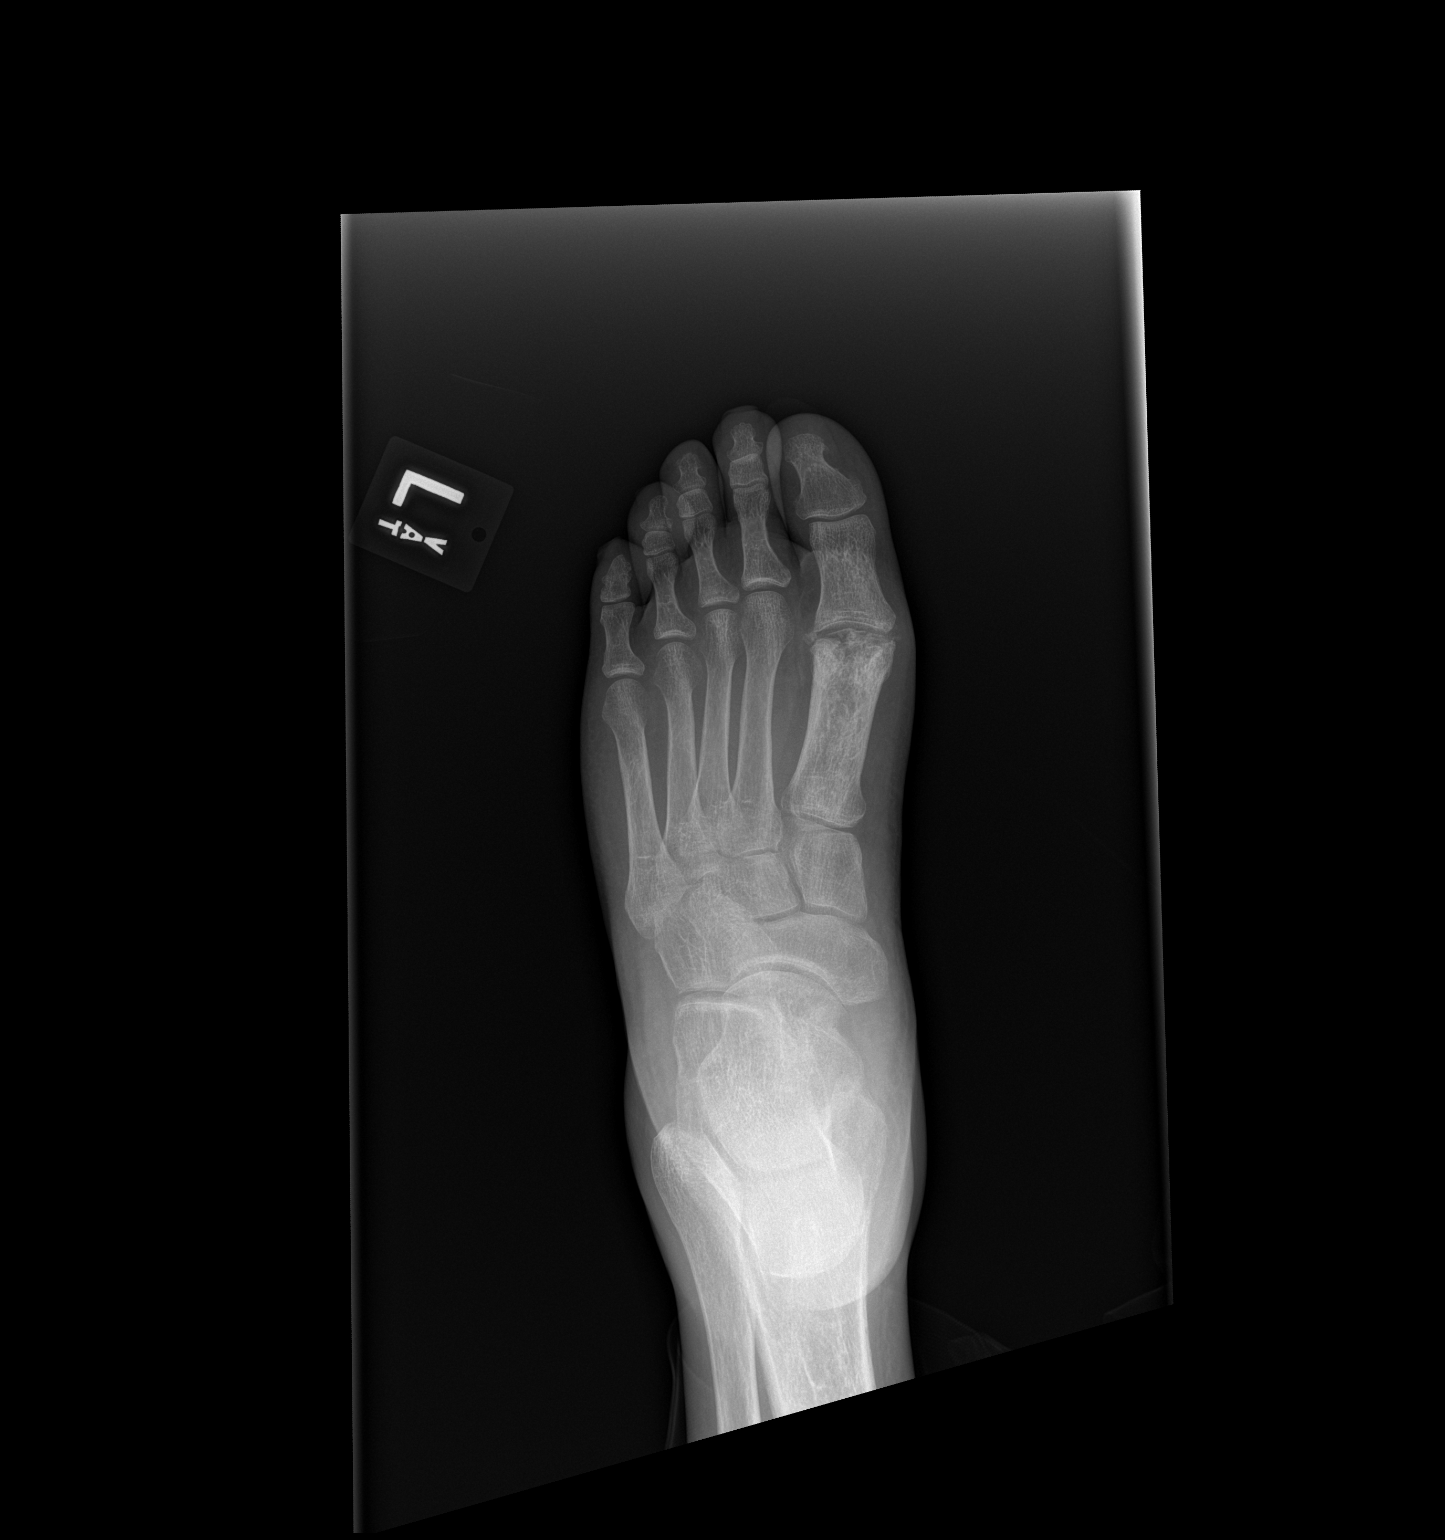

[x foot lat left]
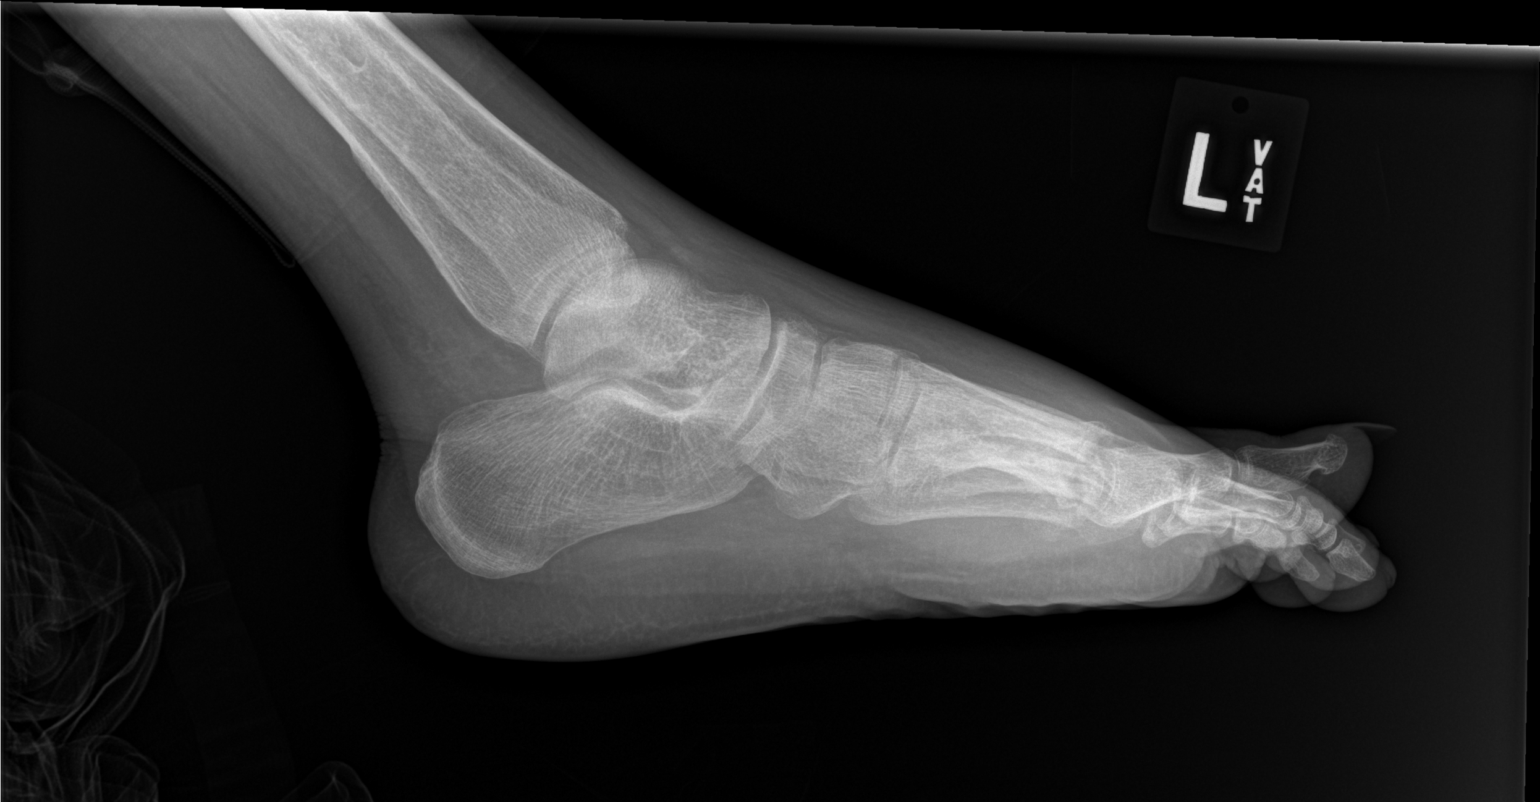

[2 of 2 positions shown; findings below may reference images not displayed]

FINDINGS: Increased sclerosis is again noted in the first metatarsal with
arthritic changes of the first MTP joint. No acute fracture or
dislocation is noted. No soft tissue abnormality is seen.
IMPRESSION: Chronic changes in the first metatarsal stable from the prior exam.
No acute abnormality noted.

## 2020-04-26 NOTE — Telephone Encounter (Signed)
Due to upgrade, this was a duplicate chart.  Corrected request sent via correct chart.

## 2020-04-27 ENCOUNTER — Other Ambulatory Visit: Payer: Self-pay | Admitting: Family Medicine

## 2020-04-27 ENCOUNTER — Telehealth: Payer: Self-pay

## 2020-04-27 ENCOUNTER — Other Ambulatory Visit: Payer: 59

## 2020-04-27 ENCOUNTER — Other Ambulatory Visit: Payer: Self-pay

## 2020-04-27 DIAGNOSIS — N184 Chronic kidney disease, stage 4 (severe): Secondary | ICD-10-CM

## 2020-04-27 DIAGNOSIS — G894 Chronic pain syndrome: Secondary | ICD-10-CM

## 2020-04-27 DIAGNOSIS — D5700 Hb-SS disease with crisis, unspecified: Secondary | ICD-10-CM

## 2020-04-27 DIAGNOSIS — E559 Vitamin D deficiency, unspecified: Secondary | ICD-10-CM

## 2020-04-27 MED ORDER — OXYCODONE HCL 10 MG PO TABS
10.0000 mg | ORAL_TABLET | Freq: Four times a day (QID) | ORAL | 0 refills | Status: DC | PRN
Start: 2020-04-29 — End: 2020-05-21

## 2020-04-27 MED ORDER — OXYCODONE HCL ER 15 MG PO T12A
15.0000 mg | EXTENDED_RELEASE_TABLET | Freq: Two times a day (BID) | ORAL | 0 refills | Status: DC
Start: 2020-04-30 — End: 2020-05-26

## 2020-04-27 NOTE — Telephone Encounter (Signed)
Med refill Oxycotinin 15 mg   Patient is in office now

## 2020-04-27 NOTE — Progress Notes (Signed)
Reviewed PDMP substance reporting system prior to prescribing opiate medications. No inconsistencies noted.  Meds ordered this encounter  Medications   Oxycodone HCl 10 MG TABS    Sig: Take 1 tablet (10 mg total) by mouth every 6 (six) hours as needed.    Dispense:  60 tablet    Refill:  0    Order Specific Question:   Supervising Provider    Answer:   [name], [name] [[id number]]   [name] Samad Moore Rutilio Yellowhair  APRN, MSN, FNP-C [facility] [address]  [place], [place] [address] [phone]  

## 2020-04-27 NOTE — Progress Notes (Signed)
Reviewed PDMP substance reporting system prior to prescribing opiate medications. No inconsistencies noted.  Meds ordered this encounter  Medications   oxyCODONE (OXYCONTIN) 15 mg 12 hr tablet    Sig: Take 1 tablet (15 mg total) by mouth every 12 (twelve) hours.    Dispense:  60 tablet    Refill:  0    Order Specific Question:   Supervising Provider    Answer:   [name], [name] [[id number]]   Safi Culotta Moore Eutimio Gharibian  APRN, MSN, FNP-C [facility] [address]  [place], [place] [address] [phone]  

## 2020-04-28 ENCOUNTER — Other Ambulatory Visit: Payer: Self-pay | Admitting: Family Medicine

## 2020-04-28 ENCOUNTER — Telehealth: Payer: Self-pay

## 2020-04-28 LAB — CMP14+CBC/D/PLT+FER+RETIC+V...
ALT: 27 IU/L (ref 0–44)
AST: 42 IU/L — ABNORMAL HIGH (ref 0–40)
Albumin/Globulin Ratio: 1.3 (ref 1.2–2.2)
Albumin: 3.6 g/dL — ABNORMAL LOW (ref 4.0–5.0)
Alkaline Phosphatase: 295 IU/L — ABNORMAL HIGH (ref 44–121)
BUN/Creatinine Ratio: 12 (ref 9–20)
BUN: 41 mg/dL — ABNORMAL HIGH (ref 6–20)
Basophils Absolute: 0.1 10*3/uL (ref 0.0–0.2)
Basos: 0 %
Bilirubin Total: 0.5 mg/dL (ref 0.0–1.2)
CO2: 16 mmol/L — ABNORMAL LOW (ref 20–29)
Calcium: 8.3 mg/dL — ABNORMAL LOW (ref 8.7–10.2)
Chloride: 102 mmol/L (ref 96–106)
Creatinine, Ser: 3.43 mg/dL — ABNORMAL HIGH (ref 0.76–1.27)
EOS (ABSOLUTE): 0.2 10*3/uL (ref 0.0–0.4)
Eos: 1 %
Ferritin: 5936 ng/mL — ABNORMAL HIGH (ref 30–400)
Globulin, Total: 2.8 g/dL (ref 1.5–4.5)
Glucose: 89 mg/dL (ref 65–99)
Hematocrit: 28.1 % — ABNORMAL LOW (ref 37.5–51.0)
Hemoglobin: 10.2 g/dL — ABNORMAL LOW (ref 13.0–17.7)
Immature Grans (Abs): 0.1 10*3/uL (ref 0.0–0.1)
Immature Granulocytes: 1 %
Lymphocytes Absolute: 6.6 10*3/uL — ABNORMAL HIGH (ref 0.7–3.1)
Lymphs: 42 %
MCH: 39.7 pg — ABNORMAL HIGH (ref 26.6–33.0)
MCHC: 36.3 g/dL — ABNORMAL HIGH (ref 31.5–35.7)
MCV: 109 fL — ABNORMAL HIGH (ref 79–97)
Monocytes Absolute: 0.8 10*3/uL (ref 0.1–0.9)
Monocytes: 5 %
Neutrophils Absolute: 8 10*3/uL — ABNORMAL HIGH (ref 1.4–7.0)
Neutrophils: 51 %
Platelets: 341 10*3/uL (ref 150–450)
Potassium: 5 mmol/L (ref 3.5–5.2)
RBC: 2.57 x10E6/uL — CL (ref 4.14–5.80)
RDW: 20.8 % — ABNORMAL HIGH (ref 11.6–15.4)
Retic Ct Pct: 2.8 % — ABNORMAL HIGH (ref 0.6–2.6)
Sodium: 135 mmol/L (ref 134–144)
Total Protein: 6.4 g/dL (ref 6.0–8.5)
Vit D, 25-Hydroxy: 18.8 ng/mL — ABNORMAL LOW (ref 30.0–100.0)
WBC: 15.8 10*3/uL — ABNORMAL HIGH (ref 3.4–10.8)
eGFR: 23 mL/min/{1.73_m2} — ABNORMAL LOW (ref >59)

## 2020-04-28 NOTE — Telephone Encounter (Signed)
Pt called @ pharmacy if you can OVER RIDE his OXYCOTIN to be refilled .

## 2020-04-28 NOTE — Telephone Encounter (Signed)
Oshen Serratore is a 40 year old male with a medical history significant for sickle cell disease type SS, chronic pain syndrome, opiate dependence and tolerance, stage IV CKD, and anemia of chronic disease called office with medication concerns.  Patient states that he has had to take more medication than prescribed due to increased pain.  He is requesting that his opiate medications be called in early.  Request denied.  Medications have been sent to pharmacy and no changes will be made at this time.  He will be able to pick up OxyContin from his pharmacy on 04/30/2020. Also, patient's labs were evaluated on 04/27/2020.  His serum creatinine was increased at 3.4, patient was advised to increase fluid intake.  He was also advised to follow-up at the sickle cell day clinic on 04/29/2020 for pain management and extended observation.[name]  APRN, MSN, FNP-C [facility] [address] [place],  [address] [phone]

## 2020-04-29 ENCOUNTER — Non-Acute Institutional Stay (HOSPITAL_COMMUNITY)
Admission: AD | Admit: 2020-04-29 | Discharge: 2020-04-29 | Disposition: A | Discharge status: Home / Self Care | Payer: 59 | Source: Ambulatory Visit | Attending: Internal Medicine | Admitting: Internal Medicine

## 2020-04-29 DIAGNOSIS — N184 Chronic kidney disease, stage 4 (severe): Secondary | ICD-10-CM | POA: Diagnosis not present

## 2020-04-29 DIAGNOSIS — D638 Anemia in other chronic diseases classified elsewhere: Secondary | ICD-10-CM | POA: Insufficient documentation

## 2020-04-29 DIAGNOSIS — F1120 Opioid dependence, uncomplicated: Secondary | ICD-10-CM | POA: Diagnosis not present

## 2020-04-29 DIAGNOSIS — N179 Acute kidney failure, unspecified: Secondary | ICD-10-CM | POA: Insufficient documentation

## 2020-04-29 DIAGNOSIS — Z79899 Other long term (current) drug therapy: Secondary | ICD-10-CM | POA: Insufficient documentation

## 2020-04-29 DIAGNOSIS — E860 Dehydration: Secondary | ICD-10-CM | POA: Diagnosis not present

## 2020-04-29 DIAGNOSIS — F17210 Nicotine dependence, cigarettes, uncomplicated: Secondary | ICD-10-CM | POA: Diagnosis not present

## 2020-04-29 DIAGNOSIS — D5700 Hb-SS disease with crisis, unspecified: Secondary | ICD-10-CM | POA: Insufficient documentation

## 2020-04-29 DIAGNOSIS — G894 Chronic pain syndrome: Secondary | ICD-10-CM | POA: Insufficient documentation

## 2020-04-29 DIAGNOSIS — F1410 Cocaine abuse, uncomplicated: Secondary | ICD-10-CM | POA: Diagnosis present

## 2020-04-29 LAB — BASIC METABOLIC PANEL
Anion gap: 6 (ref 5–15)
BUN: 44 mg/dL — ABNORMAL HIGH (ref 6–20)
CO2: 19 mmol/L — ABNORMAL LOW (ref 22–32)
Calcium: 8.3 mg/dL — ABNORMAL LOW (ref 8.9–10.3)
Chloride: 110 mmol/L (ref 98–111)
Creatinine, Ser: 2.88 mg/dL — ABNORMAL HIGH (ref 0.61–1.24)
GFR, Estimated: 28 mL/min — ABNORMAL LOW (ref >60)
Glucose, Bld: 97 mg/dL (ref 70–99)
Potassium: 4.8 mmol/L (ref 3.5–5.1)
Sodium: 135 mmol/L (ref 135–145)

## 2020-04-29 LAB — CBC WITH DIFFERENTIAL/PLATELET
Abs Immature Granulocytes: 0.05 10*3/uL (ref 0.00–0.07)
Basophils Absolute: 0 10*3/uL (ref 0.0–0.1)
Basophils Relative: 0 %
Eosinophils Absolute: 0.3 10*3/uL (ref 0.0–0.5)
Eosinophils Relative: 2 %
HCT: 26.1 % — ABNORMAL LOW (ref 39.0–52.0)
Hemoglobin: 9.5 g/dL — ABNORMAL LOW (ref 13.0–17.0)
Immature Granulocytes: 0 %
Lymphocytes Relative: 36 %
Lymphs Abs: 5.2 10*3/uL — ABNORMAL HIGH (ref 0.7–4.0)
MCH: 39.7 pg — ABNORMAL HIGH (ref 26.0–34.0)
MCHC: 36.4 g/dL — ABNORMAL HIGH (ref 30.0–36.0)
MCV: 109.2 fL — ABNORMAL HIGH (ref 80.0–100.0)
Monocytes Absolute: 0.8 10*3/uL (ref 0.1–1.0)
Monocytes Relative: 6 %
Neutro Abs: 7.9 10*3/uL — ABNORMAL HIGH (ref 1.7–7.7)
Neutrophils Relative %: 56 %
Platelets: 334 10*3/uL (ref 150–400)
RBC: 2.39 MIL/uL — ABNORMAL LOW (ref 4.22–5.81)
RDW: 20.1 % — ABNORMAL HIGH (ref 11.5–15.5)
WBC: 14.3 10*3/uL — ABNORMAL HIGH (ref 4.0–10.5)
nRBC: 0.1 % (ref 0.0–0.2)

## 2020-04-29 LAB — RAPID URINE DRUG SCREEN, HOSP PERFORMED
Amphetamines: NOT DETECTED
Barbiturates: NOT DETECTED
Benzodiazepines: NOT DETECTED
Cocaine: POSITIVE — AB
Opiates: POSITIVE — AB
Tetrahydrocannabinol: POSITIVE — AB

## 2020-04-29 LAB — COMPREHENSIVE METABOLIC PANEL
ALT: 33 U/L (ref 0–44)
AST: 47 U/L — ABNORMAL HIGH (ref 15–41)
Albumin: 3.3 g/dL — ABNORMAL LOW (ref 3.5–5.0)
Alkaline Phosphatase: 255 U/L — ABNORMAL HIGH (ref 38–126)
Anion gap: 11 (ref 5–15)
BUN: 50 mg/dL — ABNORMAL HIGH (ref 6–20)
CO2: 18 mmol/L — ABNORMAL LOW (ref 22–32)
Calcium: 8.4 mg/dL — ABNORMAL LOW (ref 8.9–10.3)
Chloride: 108 mmol/L (ref 98–111)
Creatinine, Ser: 3.32 mg/dL — ABNORMAL HIGH (ref 0.61–1.24)
GFR, Estimated: 24 mL/min — ABNORMAL LOW (ref >60)
Glucose, Bld: 90 mg/dL (ref 70–99)
Potassium: 4.5 mmol/L (ref 3.5–5.1)
Sodium: 137 mmol/L (ref 135–145)
Total Bilirubin: 1.3 mg/dL — ABNORMAL HIGH (ref 0.3–1.2)
Total Protein: 7 g/dL (ref 6.5–8.1)

## 2020-04-29 LAB — URINALYSIS, ROUTINE W REFLEX MICROSCOPIC
Bacteria, UA: NONE SEEN
Bilirubin Urine: NEGATIVE
Glucose, UA: 50 mg/dL — AB
Hgb urine dipstick: NEGATIVE
Ketones, ur: NEGATIVE mg/dL
Leukocytes,Ua: NEGATIVE
Nitrite: NEGATIVE
Protein, ur: >=300 mg/dL — AB
Specific Gravity, Urine: 1.008 (ref 1.005–1.030)
pH: 8 (ref 5.0–8.0)

## 2020-04-29 LAB — LACTATE DEHYDROGENASE: LDH: 189 U/L (ref 98–192)

## 2020-04-29 MED ORDER — SODIUM CHLORIDE 0.9% FLUSH: 9.0000 mL | INTRAVENOUS | Status: DC | PRN

## 2020-04-29 MED ORDER — HYDROMORPHONE 1 MG/ML IV SOLN
INTRAVENOUS | Status: DC
  Administered 2020-04-29: 11 mg via INTRAVENOUS
  Administered 2020-04-29: 30 mg via INTRAVENOUS
  Filled 2020-04-29: qty 30

## 2020-04-29 MED ORDER — NALOXONE HCL 0.4 MG/ML IJ SOLN: 0.4000 mg | INTRAMUSCULAR | Status: DC | PRN

## 2020-04-29 MED ORDER — SODIUM CHLORIDE 0.45 % IV SOLN: INTRAVENOUS | Status: DC

## 2020-04-29 NOTE — Discharge Summary (Addendum)
[facility] Discharge Summary   Patient ID: [name] [name]. MRN: [id number] DOB/AGE: July 22, 1980 40 y.o.  Admit date: 04/29/2020 Discharge date: 04/29/2020  Primary Care Physician:  [name], FNP  Admission Diagnoses:  Active Problems:   Sickle cell pain crisis [facility])  Discharge Medications:  Allergies as of 04/29/2020      Reactions   Nsaids Other (See Comments)   Kidney disease   Morphine And Related Other (See Comments)   "Real bad headaches"       Medication List    TAKE these medications   acetaminophen 500 MG tablet Commonly known as: TYLENOL Take 1 tablet (500 mg total) by mouth every 6 (six) hours as needed. What changed: reasons to take this   allopurinol 100 MG tablet Commonly known as: ZYLOPRIM Take 100 mg by mouth daily.   folic acid 1 MG tablet Commonly known as: FOLVITE TAKE 1 TABLET BY MOUTH EVERY DAY   Lokelma 10 g Pack packet Generic drug: sodium zirconium cyclosilicate Take 10 g by mouth every other day.   nortriptyline 25 MG capsule Commonly known as: PAMELOR TAKE 1 CAPSULE BY MOUTH 2 TIMES DAILY.   Oxbryta 500 MG Tabs tablet Generic drug: voxelotor Take 500 mg by mouth daily.   Oxycodone HCl 10 MG Tabs Take 1 tablet (10 mg total) by mouth every 6 (six) hours as needed.   oxyCODONE 15 mg 12 hr tablet Commonly known as: OxyCONTIN Take 1 tablet (15 mg total) by mouth every 12 (twelve) hours. Start taking on: April 30, 2020   sodium bicarbonate 650 MG tablet Take 650 mg by mouth 2 (two) times daily.   Vitamin D3 50 MCG (2000 UT) Tabs Take 1 each by mouth daily. What changed: how much to take        Consults:  None  Significant Diagnostic Studies:  VAS [place] LOWER EXTREMITY VENOUS (DVT) (ONLY [facility] & [facility] 7a-7p)  Result Date: 04/12/2020  Lower Venous DVT Study Indications: Swelling in bilateral knees.  Risk Factors: Sickle cell. Comparison Study: No prior studies. Performing Technologist: Darlin Coco RDMS,  RVT  Examination Guidelines: A complete evaluation includes B-mode imaging, spectral Doppler, color Doppler, and power Doppler as needed of all accessible portions of each vessel. Bilateral testing is considered an integral part of a complete examination. Limited examinations for reoccurring indications may be performed as noted. The reflux portion of the exam is performed with the patient in reverse Trendelenburg.  +---------+---------------+---------+-----------+----------+--------------+ RIGHT    CompressibilityPhasicitySpontaneityPropertiesThrombus Aging +---------+---------------+---------+-----------+----------+--------------+ CFV      Full           Yes      Yes                                 +---------+---------------+---------+-----------+----------+--------------+ SFJ      Full                                                        +---------+---------------+---------+-----------+----------+--------------+ FV Prox  Full                                                        +---------+---------------+---------+-----------+----------+--------------+  FV Mid   Full                                                        +---------+---------------+---------+-----------+----------+--------------+ FV DistalFull                                                        +---------+---------------+---------+-----------+----------+--------------+ PFV      Full                                                        +---------+---------------+---------+-----------+----------+--------------+ POP      Full           Yes      Yes                                 +---------+---------------+---------+-----------+----------+--------------+ PTV      Full                                                        +---------+---------------+---------+-----------+----------+--------------+ PERO     Full                                                         +---------+---------------+---------+-----------+----------+--------------+   +---------+---------------+---------+-----------+----------+--------------+ LEFT     CompressibilityPhasicitySpontaneityPropertiesThrombus Aging +---------+---------------+---------+-----------+----------+--------------+ CFV      Full           Yes      Yes                                 +---------+---------------+---------+-----------+----------+--------------+ SFJ      Full                                                        +---------+---------------+---------+-----------+----------+--------------+ FV Prox  Full                                                        +---------+---------------+---------+-----------+----------+--------------+ FV Mid   Full                                                        +---------+---------------+---------+-----------+----------+--------------+  FV DistalFull                                                        +---------+---------------+---------+-----------+----------+--------------+ PFV      Full                                                        +---------+---------------+---------+-----------+----------+--------------+ POP      Full           Yes      Yes                                 +---------+---------------+---------+-----------+----------+--------------+ PTV      Full                                                        +---------+---------------+---------+-----------+----------+--------------+ PERO     Full                                                        +---------+---------------+---------+-----------+----------+--------------+     Summary: RIGHT: - There is no evidence of deep vein thrombosis in the lower extremity.  - A cystic structure is found in the popliteal fossa.  LEFT: - There is no evidence of deep vein thrombosis in the lower extremity.  - A cystic structure is found in the popliteal  fossa. - Ultrasound characteristics of enlarged lymph nodes noted in the groin.  *See table(s) above for measurements and observations. Electronically signed by Servando Snare MD on 04/12/2020 at 6:02:15 PM.    Final    VAS [place] UPPER EXTREMITY VENOUS DUPLEX  Result Date: 04/12/2020 UPPER VENOUS STUDY  Indications: Swelling Risk Factors: Sickle Cell. Comparison Study: No prior studies. Performing Technologist: Darlin Coco RDMS, RVT  Examination Guidelines: A complete evaluation includes B-mode imaging, spectral Doppler, color Doppler, and power Doppler as needed of all accessible portions of each vessel. Bilateral testing is considered an integral part of a complete examination. Limited examinations for reoccurring indications may be performed as noted.  Left Findings: +----------+------------+---------+-----------+----------+--------------+ LEFT      CompressiblePhasicitySpontaneousProperties   Summary     +----------+------------+---------+-----------+----------+--------------+ IJV           Full       Yes       Yes                             +----------+------------+---------+-----------+----------+--------------+ Subclavian    Full       Yes       Yes                             +----------+------------+---------+-----------+----------+--------------+ Axillary  Full       Yes       Yes                             +----------+------------+---------+-----------+----------+--------------+ Brachial      Full                                                 +----------+------------+---------+-----------+----------+--------------+ Radial        Full                                                 +----------+------------+---------+-----------+----------+--------------+ Ulnar         Full                                                 +----------+------------+---------+-----------+----------+--------------+ Cephalic                                             Not visualized +----------+------------+---------+-----------+----------+--------------+ Basilic       Full                                                 +----------+------------+---------+-----------+----------+--------------+  Summary:  Left: No evidence of deep vein thrombosis in the upper extremity. No evidence of superficial vein thrombosis in the upper extremity. However, unable to visualize the cephalic vein.  *See table(s) above for measurements and observations.  Diagnosing physician: Servando Snare MD Electronically signed by Servando Snare MD on 04/12/2020 at 6:02:24 PM.    Final    :  History of Present Illness: [name] [name] is a 40 year old male with a medical history significant for sickle cell disease, chronic pain syndrome, opiate dependence and tolerance, history of polysubstance abuse, history of anemia of chronic disease, and history of CKD stage IV presents with complaints of generalized pain that is consistent with his typical pain crisis.  Patient states that pain intensity has been elevated over the past several days.  He admits that he has been taking more medication than prescribed and ran out of his home oxycodone.  He attributes current sickle cell crisis to running out of medications and changes in weather.  Patient states that he is mostly been taking Tylenol without any relief.  Patient was evaluated by PCP on 04/27/2020, labs showed creatinine elevated above baseline 3.73.  He is followed by nephrology as an outpatient.  Patient endorses some dehydration.  Currently rates pain as 10/10 characterized as constant and throbbing. He denies any headache, fever, chills, shortness of breath, urinary symptoms, nausea, vomiting, or diarrhea.  He has had no sick contacts, recent travel, or exposure to COVID-19.  Of note, he is fully vaccinated against COVID-19.   [facility] Course: Sickle cell pain crisis: Patient admitted to sickle cell day infusion  center for  management of pain crisis.  WBCs elevated at 14.3, patient is afebrile without any signs of infection or inflammation. Hemoglobin is 9.5, which is slightly above patient's baseline.  He is followed by hematology closely and recently started [place].  Patient's advised to continue medications and follow-up with hematology as scheduled.   Pain was managed with IV Dilaudid PCA nitiate IV dilaudid PCA. Settings of 0.5 mg, 10-minute lockout, and 3 mg/h IV fluids, 0.45% saline at 125 mL/h Tylenol 1000 mg by mouth times one dose Pain intensity decreased to 8/10.  Patient was offered admission, but declined.  Advised to return to clinic in a.m. if pain persists.  He will discharge home in a hemodynamically stable condition.  Acute kidney injury superimposed on chronic kidney disease stage IV: Labs obtained on 04/27/2020.  Creatinine level markedly improved with hydration 3.32<2.88.   More than likely increased due to above baseline due to dehydration.   Patient advised to avoid all nephrotoxins and follow up with nephrology as scheduled.   Cocaine abuse:  Reviewed urine drug screen, positive for cocaine.  Patient has long history of polysubstance abuse.  Discussed at length.  He adamantly denies regular use.  He states that he borrowed some pain medications from a friend and they must of been "laced" with cocaine  Patient declines any treatment at this time.  However, he was provided written information on community resources for drug addiction.  Also, patient is connected with Estanislado Emms, LCSW.   Discharge instructions:  Resume all home medications.   Follow up with PCP as previously  scheduled.   Discussed the importance of drinking 64 ounces of water daily, dehydration of red blood cells may lead further sickling.   Avoid all stressors that precipitate sickle cell pain crisis.     The patient was given clear instructions to go to ER or return to medical center if symptoms do not improve,  worsen or new problems develop.      Review of Systems  Constitutional: Negative for chills and fever.  HENT: Negative.   Eyes: Negative.   Respiratory: Negative.   Cardiovascular: Negative.   Gastrointestinal: Negative.   Genitourinary: Negative.   Musculoskeletal: Positive for back pain and joint pain.  Skin: Negative.   Neurological: Negative.   Psychiatric/Behavioral: Negative.     Physical Exam: Blood pressure 121/83, pulse (!) 101, temperature 98.8 F (37.1 C), temperature source Temporal, resp. rate 16, SpO2 100 %. Physical Exam Constitutional:      Appearance: Normal appearance.  Eyes:     General: Scleral icterus present.     Pupils: Pupils are equal, round, and reactive to light.  Cardiovascular:     Rate and Rhythm: Normal rate.     Pulses: Normal pulses.     Heart sounds: Murmur heard.    Pulmonary:     Effort: Pulmonary effort is normal.  Abdominal:     General: Bowel sounds are normal.  Musculoskeletal:        General: Normal range of motion.  Neurological:     General: No focal deficit present.     Mental Status: He is alert.  Psychiatric:        Mood and Affect: Mood normal.        Thought Content: Thought content normal.         [name]  APRN, MSN, FNP-C [facility] [address] [place], [place] [address] [phone]  04/29/2020, 4:31 PM

## 2020-04-29 NOTE — Progress Notes (Signed)
Patient admitted to the day infusion hospital for sickle cell pain and elevated creatinine. Initially, patient reported bilateral leg pain rated 10/10.Labs obtained upon arrival and Creatinine 3.32.  For pain management, patient placed on Dilaudid PCA and hydrated with IV fluids. At discharge, patient rated pain at 5/10. BMP redrawn and Creatinine 2.88. AVS offered but patient refused. Patient to have follow-up labs on Monday. Vital signs stable. Patient alert, oriented and ambulatory at discharge.

## 2020-04-29 NOTE — Discharge Instructions (Signed)
Finding Treatment for Addiction Addiction is a complex disease of the brain that causes an uncontrollable (compulsive) need for:  A substance. This includes alcohol, illegal drugs, or prescription medicines, such as painkillers.  An activity or behavior, such as gambling or shopping. Addiction changes the way your brain works. Because of this change:  The need for the medicine, drug, or activity can become so strong that you think about it all the time.  Getting more and more of your addiction becomes the most important thing to you.  You may find yourself leaving other activities and relationships to pursue your addiction.  You can become physically dependent on a substance.  Your health, behavior, emotions, and relationships can change for the worse. How do I know if I need treatment for addiction? Addiction is a progressive disease. Without treatment, addiction can get worse. Living with addiction puts you at higher risk for injury, poor health, loss of employment, loss of money, and even death. You might need treatment for addiction if:  You have tried to stop or cut down, but you have not succeeded.  You find it annoying that your friends and family are concerned about your use or behavior.  You feel guilty about your use or behavior.  You need a particular substance or activity to start your day or to calm down.  You are running out of money because of your addiction.  You have done something illegal to support your addiction.  Your addiction has caused you: ? Health problems. ? Trouble in school, work, home, or with the police. ? To devote all your time to your addiction, and not to other responsibilities. ? To tell lies in order to hide your problem. What types of treatment are available? There may be options for treatment programs and plans based on your addiction, condition, needs, and preferences. No single treatment is right for everyone.  Treatment programs  can be: ? Outpatient. You live at home and go to work or school, but you go to a clinic for treatment. ? Inpatient. You live and sleep at the program facility during treatment.  Programs may include: ? Medicine. You may need medicine to treat the addiction itself, or to treat anxiety or depression. ? Counseling and behavior therapy. This can help individuals and families behave in healthier ways and relate more effectively. ? Support groups. Confidential group therapy, such as a 12-step program, can help individuals and families during treatment and recovery. ? A combination of education, counseling, and a 12-step, spirituality-based approach.   What should I consider when selecting a treatment program? Think about your individual requirements when selecting a treatment program. Ask about:  The overall approach to treatment. ? Some programs are strictly 12-step programs. Some have a more flexible approach. ? Programs may differ in length of stay, setting, and size. ? Some programs include your family in your treatment plan. Support may be offered to them throughout the treatment process, as well as instructions for them when you are discharged. ? You may continue to receive support after you have left the program.  The types of medical services that are offered. Find out if the program: ? Offers specific treatment for your particular addiction. ? Meets all of your needs, including physical and cultural needs. ? Includes any medicines you might need. ? Offers mental health counseling as part of your treatment. ? Offers the 12-step meetings at the center, or if transport is available for patients to attend meetings at other  locations.  The cost and types of insurance that are accepted. ? Some programs are sponsored by the government. They support patients who do not have private insurance. ? If you do not have insurance, or if you choose to attend a program that does not accept your  insurance, call the treatment center. Tell them your financial needs and whether a payment plan can be set up. ? There are also organizations that will help you find the resources for treatment. You can find them online by searching "treatment for addiction."  If the program is certified by the appropriate government agency. Where to find support  Your health care provider can help you to find the right treatment. These discussions are confidential.  The [employer] on Alcoholism and Drug Dependence (NCADD). This group has information about treatment centers and programs for people who have an addiction and for family members. ? Call: 1-800-NCA-CALL (216-096-4841). ? Visit the website: https://www.ncadd.org/  The Substance Abuse and Mental Health Services Administration [facility]). This organization will help you find publicly funded treatment centers, help hotlines, and counseling services near you. ? Call: 1-800-662-HELP 239-691-1834). ? Visit the website: www.findtreatment.SamedayNews.com.cy  The National Problem Gambling Helpline. This is a 24-hour confidential helpline for gambling addiction. ? Call: (949) 568-1307 ? Visit the website: http://wiggins.com/ In countries outside of the [place] and [place], look in [employer] for contact information for services in your area. Follow these instructions at home:  Find supportive people who will help you stay away from your addiction and stay sober.  Do not use the substance or engage in the activity.  If you have been through treatment: ? Follow your plan. The plan is usually developed by you and your health care provider during treatment. ? Go to meetings with other people in recovery. ? Avoid people, situations, and things that lead you to do the things you are addicted to (triggers). Summary  Addiction changes the way your brain works. These changes cause a desire to repeat and increase the use of the a substance or  behavior.  Addiction is a progressive disease. Without treatment, addiction can get worse. Living with addiction puts you at higher risk for injury, poor health, loss of employment, loss of money, and even death.  There may be options for treatment programs and plans based on your addiction, condition, needs, and preferences. No single treatment is right for everyone.  Your health care provider can help you to find the right treatment. These discussions are confidential. This information is not intended to replace advice given to you by your health care provider. Make sure you discuss any questions you have with your health care provider. Document Revised: 10/23/2018 Document Reviewed: 02/28/2017 Elsevier Patient Education  2021 [place]. Sickle Cell Anemia, Adult  Sickle cell anemia is a condition where your red blood cells are shaped like sickles. Red blood cells carry oxygen through the body. Sickle-shaped cells do not live as long as normal red blood cells. They also clump together and block blood from flowing through the blood vessels. This prevents the body from getting enough oxygen. Sickle cell anemia causes organ damage and pain. It also increases the risk of infection. Follow these instructions at home: Medicines  Take over-the-counter and prescription medicines only as told by your doctor.  If you were prescribed an antibiotic medicine, take it as told by your doctor. Do not stop taking the antibiotic even if you start to feel better.  If you develop a fever, do not take  medicines to lower the fever right away. Tell your doctor about the fever. Managing pain, stiffness, and swelling  Try these methods to help with pain: ? Use a heating pad. ? Take a warm bath. ? Distract yourself, such as by watching TV. Eating and drinking  Drink enough fluid to keep your pee (urine) clear or pale yellow. Drink more in hot weather and during exercise.  Limit or avoid alcohol.  Eat a  healthy diet. Eat plenty of fruits, vegetables, whole grains, and lean protein.  Take vitamins and supplements as told by your doctor. Traveling  When traveling, keep these with you: ? Your medical information. ? The names of your doctors. ? Your medicines.  If you need to take an airplane, talk to your doctor first. Activity  Rest often.  Avoid exercises that make your heart beat much faster, such as jogging. General instructions  Do not use products that have nicotine or tobacco, such as cigarettes and e-cigarettes. If you need help quitting, ask your doctor.  Consider wearing a medical alert bracelet.  Avoid being in high places (high altitudes), such as mountains.  Avoid very hot or cold temperatures.  Avoid places where the temperature changes a lot.  Keep all follow-up visits as told by your doctor. This is important. Contact a doctor if:  A joint hurts.  Your feet or hands hurt or swell.  You feel tired (fatigued). Get help right away if:  You have symptoms of infection. These include: ? Fever. ? Chills. ? Being very tired. ? Irritability. ? Poor eating. ? Throwing up (vomiting).  You feel dizzy or faint.  You have new stomach pain, especially on the left side.  You have a an erection (priapism) that lasts more than 4 hours.  You have numbness in your arms or legs.  You have a hard time moving your arms or legs.  You have trouble talking.  You have pain that does not go away when you take medicine.  You are short of breath.  You are breathing fast.  You have a long-term cough.  You have pain in your chest.  You have a bad headache.  You have a stiff neck.  Your stomach looks bloated even though you did not eat much.  Your skin is pale.  You suddenly cannot see well. Summary  Sickle cell anemia is a condition where your red blood cells are shaped like sickles.  Follow your doctor's advice on ways to manage pain, food to eat,  activities to do, and steps to take for safe travel.  Get medical help right away if you have any signs of infection, such as a fever. This information is not intended to replace advice given to you by your health care provider. Make sure you discuss any questions you have with your health care provider. Document Revised: 06/26/2019 Document Reviewed: 06/26/2019 Elsevier Patient Education  [place].

## 2020-04-29 NOTE — H&P (Signed)
[facility] History and Physical   Date: 04/29/2020  Patient name: [name] [name]. Medical record number: [id number] Date of birth: 06-08-1980 Age: 40 y.o. Gender: male PCP: [name], FNP  Attending physician: [name], MD  Chief Complaint: Sickle cell pain  History of Present Illness: [name] [name] is a 40 year old male with a medical history significant for sickle cell disease, chronic pain syndrome, opiate dependence and tolerance, history of polysubstance abuse, history of anemia of chronic disease, and history of CKD stage IV presents with complaints of generalized pain that is consistent with his typical pain crisis.  Patient states that pain intensity has been elevated over the past several days.  He admits that he has been taking more medication than prescribed and ran out of his home oxycodone.  He attributes current sickle cell crisis to running out of medications and changes in weather.  Patient states that he is mostly been taking Tylenol without any relief.  Patient was evaluated by PCP on 04/27/2020, labs showed creatinine elevated above baseline 3.73.  He is followed by nephrology as an outpatient.  Patient endorses some dehydration.  Currently rates pain as 10/10 characterized as constant and throbbing. He denies any headache, fever, chills, shortness of breath, urinary symptoms, nausea, vomiting, or diarrhea.  He has had no sick contacts, recent travel, or exposure to COVID-19.  Of note, he is fully vaccinated against COVID-19  Meds: Medications Prior to Admission  Medication Sig Dispense Refill Last Dose  . acetaminophen (TYLENOL) 500 MG tablet Take 1 tablet (500 mg total) by mouth every 6 (six) hours as needed. (Patient taking differently: Take 500 mg by mouth every 6 (six) hours as needed for mild pain.) 30 tablet 0 04/28/2020 at Unknown time  . folic acid (FOLVITE) 1 MG tablet TAKE 1 TABLET BY MOUTH EVERY DAY (Patient taking  differently: Take 1 mg by mouth daily.) 90 tablet 4 04/29/2020 at Unknown time  . nortriptyline (PAMELOR) 25 MG capsule TAKE 1 CAPSULE BY MOUTH 2 TIMES DAILY. (Patient taking differently: Take 25 mg by mouth 2 (two) times daily.) 180 capsule 1 04/29/2020 at Unknown time  . OXBRYTA 500 MG TABS tablet Take 500 mg by mouth daily.   04/29/2020 at Unknown time  . [START ON 04/30/2020] oxyCODONE (OXYCONTIN) 15 mg 12 hr tablet Take 1 tablet (15 mg total) by mouth every 12 (twelve) hours. 60 tablet 0 Past Month at Unknown time  . Oxycodone HCl 10 MG TABS Take 1 tablet (10 mg total) by mouth every 6 (six) hours as needed. 60 tablet 0 04/29/2020 at Unknown time  . sodium bicarbonate 650 MG tablet Take 650 mg by mouth 2 (two) times daily.   04/29/2020 at Unknown time  . allopurinol (ZYLOPRIM) 100 MG tablet Take 100 mg by mouth daily.     . Cholecalciferol (VITAMIN D3) 50 MCG (2000 UT) TABS Take 1 each by mouth daily. (Patient taking differently: Take 2,000 Units by mouth daily.) 30 tablet 11 More than a month at Unknown time  . LOKELMA 10 g PACK packet Take 10 g by mouth every other day.       Allergies: Nsaids and Morphine and related Past Medical History:  Diagnosis Date  . Abscess of right iliac fossa 09/24/2018   required Perc Drain 09/24/2018  . Arachnoid Cyst of brain bilaterally    "2 really small ones in the back of my head; inside; saw them w/MRI" (09/25/2012)  . Bacterial pneumonia ~ 2012   "  caught it here in the hospital" (09/25/2012)  . Chronic kidney disease    "from my sickle cell" (09/25/2012)  . CKD (chronic kidney disease), stage II   . Colitis 04/19/2017   CT scan abd/ pelvis  . GERD (gastroesophageal reflux disease)    "after I eat alot of spicey foods" (09/25/2012)  . Gynecomastia, male 07/10/2012  . History of blood transfusion    "always related to sickle cell crisis" (09/25/2012)  . 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   Noted in noted from Hematology notes at [facility]   . Migraines    "take RX qd to prevent them" (09/25/2012)  . Opioid dependence with withdrawal ([place]) 08/30/2012  . Renal insufficiency   . Sickle cell anemia (H[facility])   . Sickle cell crisis ([place]) 09/25/2012  . Sickle cell nephropathy ([place]) 07/10/2012  . Sinus tachycardia   . Tachycardia with heart rate 121-140 beats per minute with ambulation 08/04/2016   Past Surgical History:  Procedure Laterality Date  . ABCESS DRAINAGE    . CHOLECYSTECTOMY  ~ 2012  . COLONOSCOPY N/A 04/23/2017   Procedure: COLONOSCOPY;  Surgeon: Irene Shipper, MD;  Location: [facility];  Service: Endoscopy;  Laterality: N/A;  . IR FLUORO GUIDE CV LINE RIGHT  12/17/2016  . IR RADIOLOGIST EVAL & MGMT  10/02/2018  . IR RADIOLOGIST EVAL & MGMT  10/15/2018  . IR REMOVAL TUN CV CATH W/O FL  12/21/2016  . IR US GUIDE VASC ACCESS RIGHT  12/17/2016  . spleenectomy     Family History  Problem Relation Age of Onset  . Breast cancer Mother    Social History   Socioeconomic History  . Marital status: Single    Spouse name: Not on file  . Number of children: Not on file  . Years of education: Not on file  . Highest education level: Not on file  Occupational History  . Not on file  Tobacco Use  . Smoking status: Current Some Day Smoker    Packs/day: 0.10    Types: Cigarettes  . Smokeless tobacco: Never Used  . Tobacco comment: 09/25/2012 "I don't buy cigarettes; bum one from friends q now and then"  Vaping Use  . Vaping Use: Never used  Substance and Sexual Activity  . Alcohol use: Yes    Alcohol/week: 0.0 standard drinks    Comment: Once a month  . Drug use: Yes    Types: Marijuana    Comment: Every 2-3 weeks   . Sexual activity: Yes  Other Topics Concern  . Not on file  Social History Narrative    Lives alone in [place].   Back at school, studying Public relations account executive. Unemployed.    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    Social Determinants of Health   Financial Resource Strain: Not on file  Food Insecurity: Not on file  Transportation Needs: Not on file  Physical Activity: Not on file  Stress: Not on file  Social Connections: Not on file  Intimate Partner Violence: Not on file   Review of Systems  Constitutional: Negative for chills and fever.  HENT: Negative.   Eyes: Negative.   Respiratory: Negative.   Cardiovascular: Negative.   Gastrointestinal: Negative.   Genitourinary: Negative.   Musculoskeletal: Positive for back pain and joint pain.  Skin: Negative.   Neurological: Negative.   Psychiatric/Behavioral:  Negative.     Physical Exam: Blood pressure 121/83, pulse (!) 101, temperature 98.8 F (37.1 C), temperature source Temporal, resp. rate 16, SpO2 100 %. Physical Exam Constitutional:      Appearance: Normal appearance.  Eyes:     General: Scleral icterus present.     Pupils: Pupils are equal, round, and reactive to light.  Cardiovascular:     Rate and Rhythm: Normal rate and regular rhythm.     Pulses: Normal pulses.     Heart sounds: Murmur heard.    Pulmonary:     Effort: Pulmonary effort is normal.  Abdominal:     General: Abdomen is flat. Bowel sounds are normal.  Musculoskeletal:        General: Normal range of motion.  Skin:    General: Skin is warm.  Neurological:     General: No focal deficit present.     Mental Status: He is alert. Mental status is at baseline.  Psychiatric:        Mood and Affect: Mood normal.        Thought Content: Thought content normal.        Judgment: Judgment normal.      Lab results: Results for orders placed or performed during the hospital encounter of 04/29/20 (from the past 24 hour(s))  Lactate dehydrogenase     Status: None   Collection Time: 04/29/20  9:15 AM  Result Value Ref Range   LDH 189 98 - 192 U/L  Comprehensive metabolic panel     Status: Abnormal    Collection Time: 04/29/20  9:15 AM  Result Value Ref Range   Sodium 137 135 - 145 mmol/L   Potassium 4.5 3.5 - 5.1 mmol/L   Chloride 108 98 - 111 mmol/L   CO2 18 (L) 22 - 32 mmol/L   Glucose, Bld 90 70 - 99 mg/dL   BUN 50 (H) 6 - 20 mg/dL   Creatinine, Ser 3.32 (H) 0.61 - 1.24 mg/dL   Calcium 8.4 (L) 8.9 - 10.3 mg/dL   Total Protein 7.0 6.5 - 8.1 g/dL   Albumin 3.3 (L) 3.5 - 5.0 g/dL   AST 47 (H) 15 - 41 U/L   ALT 33 0 - 44 U/L   Alkaline Phosphatase 255 (H) 38 - 126 U/L   Total Bilirubin 1.3 (H) 0.3 - 1.2 mg/dL   GFR, Estimated 24 (L) >60 mL/min   Anion gap 11 5 - 15  CBC WITH DIFFERENTIAL     Status: Abnormal   Collection Time: 04/29/20  9:15 AM  Result Value Ref Range   WBC 14.3 (H) 4.0 - 10.5 K/uL   RBC 2.39 (L) 4.22 - 5.81 MIL/uL   Hemoglobin 9.5 (L) 13.0 - 17.0 g/dL   HCT 26.1 (L) 39.0 - 52.0 %   MCV 109.2 (H) 80.0 - 100.0 fL   MCH 39.7 (H) 26.0 - 34.0 pg   MCHC 36.4 (H) 30.0 - 36.0 g/dL   RDW 20.1 (H) 11.5 - 15.5 %   Platelets 334 150 - 400 K/uL   nRBC 0.1 0.0 - 0.2 %   Neutrophils Relative % 56 %   Neutro Abs 7.9 (H) 1.7 - 7.7 K/uL   Lymphocytes Relative 36 %   Lymphs Abs 5.2 (H) 0.7 - 4.0 K/uL   Monocytes Relative 6 %   Monocytes Absolute 0.8 0.1 - 1.0 K/uL   Eosinophils Relative 2 %   Eosinophils Absolute 0.3 0.0 - 0.5 K/uL   Basophils Relative 0 %  Basophils Absolute 0.0 0.0 - 0.1 K/uL   Immature Granulocytes 0 %   Abs Immature Granulocytes 0.05 0.00 - 0.07 K/uL  Urinalysis, Routine w reflex microscopic     Status: Abnormal   Collection Time: 04/29/20 11:00 AM  Result Value Ref Range   Color, Urine YELLOW YELLOW   APPearance CLEAR CLEAR   Specific Gravity, Urine 1.008 1.005 - 1.030   pH 8.0 5.0 - 8.0   Glucose, UA 50 (A) NEGATIVE mg/dL   Hgb urine dipstick NEGATIVE NEGATIVE   Bilirubin Urine NEGATIVE NEGATIVE   Ketones, ur NEGATIVE NEGATIVE mg/dL   Protein, ur >=300 (A) NEGATIVE mg/dL   Nitrite NEGATIVE NEGATIVE   Leukocytes,Ua NEGATIVE NEGATIVE    RBC / HPF 0-5 0 - 5 RBC/hpf   WBC, UA 0-5 0 - 5 WBC/hpf   Bacteria, UA NONE SEEN NONE SEEN  Rapid urine drug screen (hospital performed)     Status: Abnormal   Collection Time: 04/29/20 11:00 AM  Result Value Ref Range   Opiates POSITIVE (A) NONE DETECTED   Cocaine POSITIVE (A) NONE DETECTED   Benzodiazepines NONE DETECTED NONE DETECTED   Amphetamines NONE DETECTED NONE DETECTED   Tetrahydrocannabinol POSITIVE (A) NONE DETECTED   Barbiturates NONE DETECTED NONE DETECTED  Basic metabolic panel     Status: Abnormal   Collection Time: 04/29/20  3:30 PM  Result Value Ref Range   Sodium 135 135 - 145 mmol/L   Potassium 4.8 3.5 - 5.1 mmol/L   Chloride 110 98 - 111 mmol/L   CO2 19 (L) 22 - 32 mmol/L   Glucose, Bld 97 70 - 99 mg/dL   BUN 44 (H) 6 - 20 mg/dL   Creatinine, Ser 2.88 (H) 0.61 - 1.24 mg/dL   Calcium 8.3 (L) 8.9 - 10.3 mg/dL   GFR, Estimated 28 (L) >60 mL/min   Anion gap 6 5 - 15    Imaging results:  No results found.   Assessment & Plan:  Sickle cell pain crisis: Patient admitted to sickle cell day infusion center for management of pain crisis.  Patient is opiate tolerant Initiate IV dilaudid PCA. Settings of 0.5 mg, 10-minute lockout, and 3 mg/h IV fluids, 0.45% saline at 100 mL/h Tylenol 1000 mg by mouth times one dose Review CBC with differential, complete metabolic panel, and reticulocytes as results become available.  Review urine drug screen and urinalysis.  Baseline hemoglobin is 7-8 g/dL Pain intensity will be reevaluated in context of functioning and relationship to baseline as care progresses If pain intensity remains elevated and/or sudden change in hemodynamic stability transition to inpatient services for higher level of care.   Acute kidney injury superimposed on chronic kidney disease stage IV: Labs obtained on 04/27/2020.  At that time, creatinine was 3.73 which is above patient's baseline.  Repeat BMP today.  Initiate gentle hydration.  Avoid all  nephrotoxins.  [name]  APRN, MSN, FNP-C [facility] [address] [place],  [address] [phone]  04/29/2020, 4:41 PM

## 2020-05-03 ENCOUNTER — Telehealth: Payer: Self-pay

## 2020-05-03 ENCOUNTER — Other Ambulatory Visit (INDEPENDENT_AMBULATORY_CARE_PROVIDER_SITE_OTHER): Payer: 59

## 2020-05-03 ENCOUNTER — Other Ambulatory Visit: Payer: Self-pay | Admitting: Family Medicine

## 2020-05-03 ENCOUNTER — Other Ambulatory Visit: Payer: Self-pay

## 2020-05-03 DIAGNOSIS — N184 Chronic kidney disease, stage 4 (severe): Secondary | ICD-10-CM

## 2020-05-03 DIAGNOSIS — D5700 Hb-SS disease with crisis, unspecified: Secondary | ICD-10-CM

## 2020-05-03 LAB — POCT URINALYSIS DIPSTICK
Bilirubin, UA: NEGATIVE
Blood, UA: POSITIVE
Glucose, UA: NEGATIVE
Ketones, UA: NEGATIVE
Leukocytes, UA: NEGATIVE
Nitrite, UA: NEGATIVE
Protein, UA: NEGATIVE
Spec Grav, UA: 1.02 (ref 1.010–1.025)
Urobilinogen, UA: 0.2 E.U./dL
pH, UA: 7 (ref 5.0–8.0)

## 2020-05-03 NOTE — Progress Notes (Signed)
Orders Placed This Encounter  Procedures  . CBC    Standing Status:   Future    Standing Expiration Date:   05/03/2021  . CMP and Liver    Standing Status:   Future    Standing Expiration Date:   05/03/2021

## 2020-05-03 NOTE — Telephone Encounter (Signed)
   Patient will need 2 consecutive negative drug screens due to positive urine drug screen.

## 2020-05-03 NOTE — Telephone Encounter (Signed)
No new opiate prescriptions are able to be written at this time. Patient will need to schedule an appointment in 2 weeks for UDS.    [name]  APRN, MSN, FNP-C [facility] [address] [place], [place] [address] [phone]

## 2020-05-03 NOTE — Telephone Encounter (Signed)
Prior auth for Oxycontin initiated. Insurance needs documentation that patient has tried and failed Xtampza ER, faxed to [phone]  [phone].  Can this be updated in his chart or can he be switched to [place]?

## 2020-05-04 ENCOUNTER — Telehealth: Payer: Self-pay | Admitting: Family Medicine

## 2020-05-04 NOTE — Telephone Encounter (Signed)
Patient requested Oxycontin and medicaid rejected prior auth. Patient and his mother state that they are dropping [facility] Medicaid today. Larene Beach noted that patient needed an appt to discuss the med change based on Medicaid denial, so he is scheduled for a virtual visit on 3/29. Patient prefers not to change meds if the only reason is because med is denied.  He requests a call to either him [phone]) or his mother [phone] ASAP.  Thanks!

## 2020-05-04 NOTE — Telephone Encounter (Signed)
Spoke with patient and mother who had concerns pertaining to prior authorization. All concerns addressed. Patient had a positive UDS. Will address medication changes at a later date. Patient will need to schedule lab appointment next week.   [name]  APRN, MSN, FNP-C [facility] [address] [place], [place] [address] [phone]

## 2020-05-06 IMAGING — CR DG CHEST 2V
2 series · 2 of 2 positions shown · non-contrast
Comparison: November 20, 2018

CLINICAL DATA: Chest pain and sickle cell.

EXAM:
CHEST - 2 VIEW

[w chest pa]
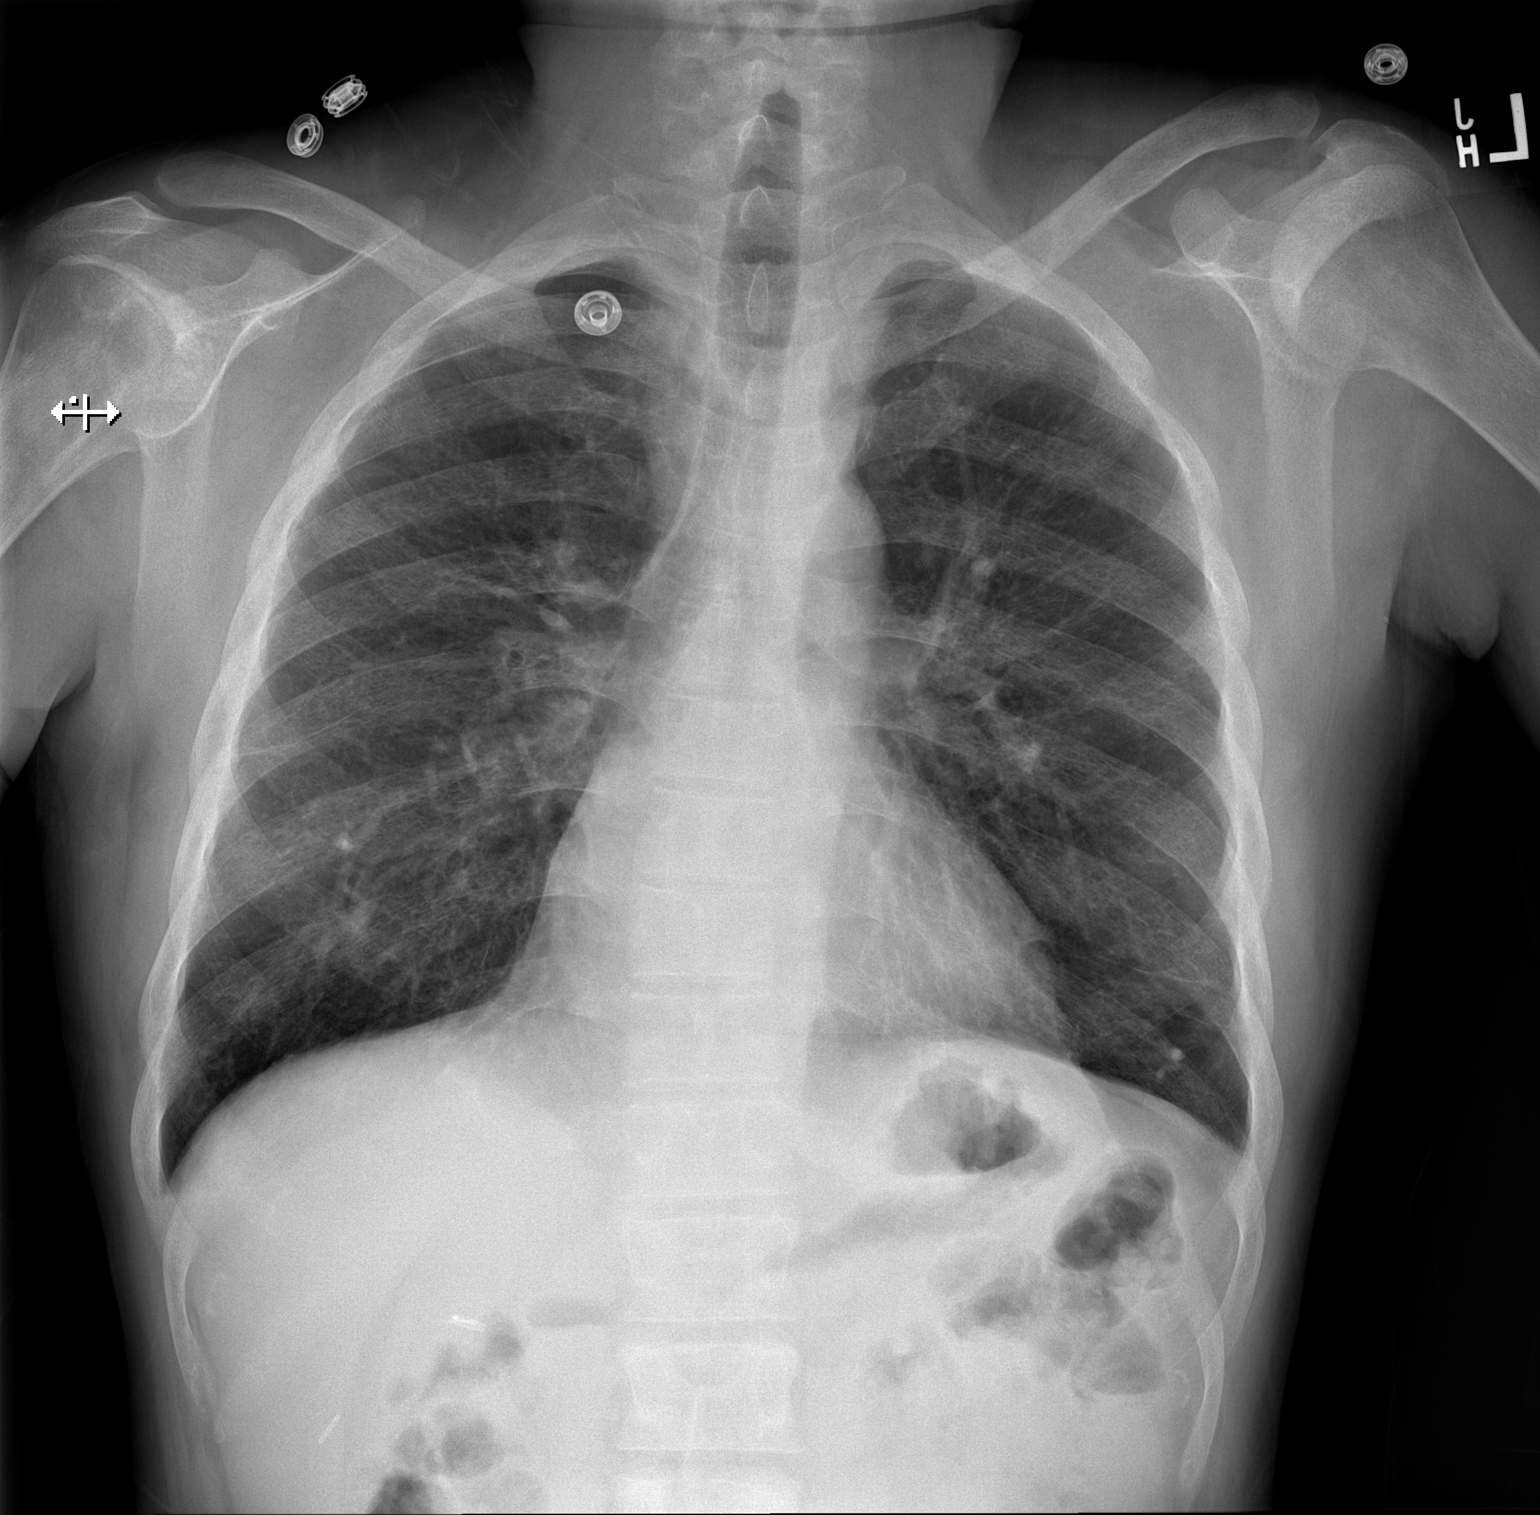

[w chest lat]
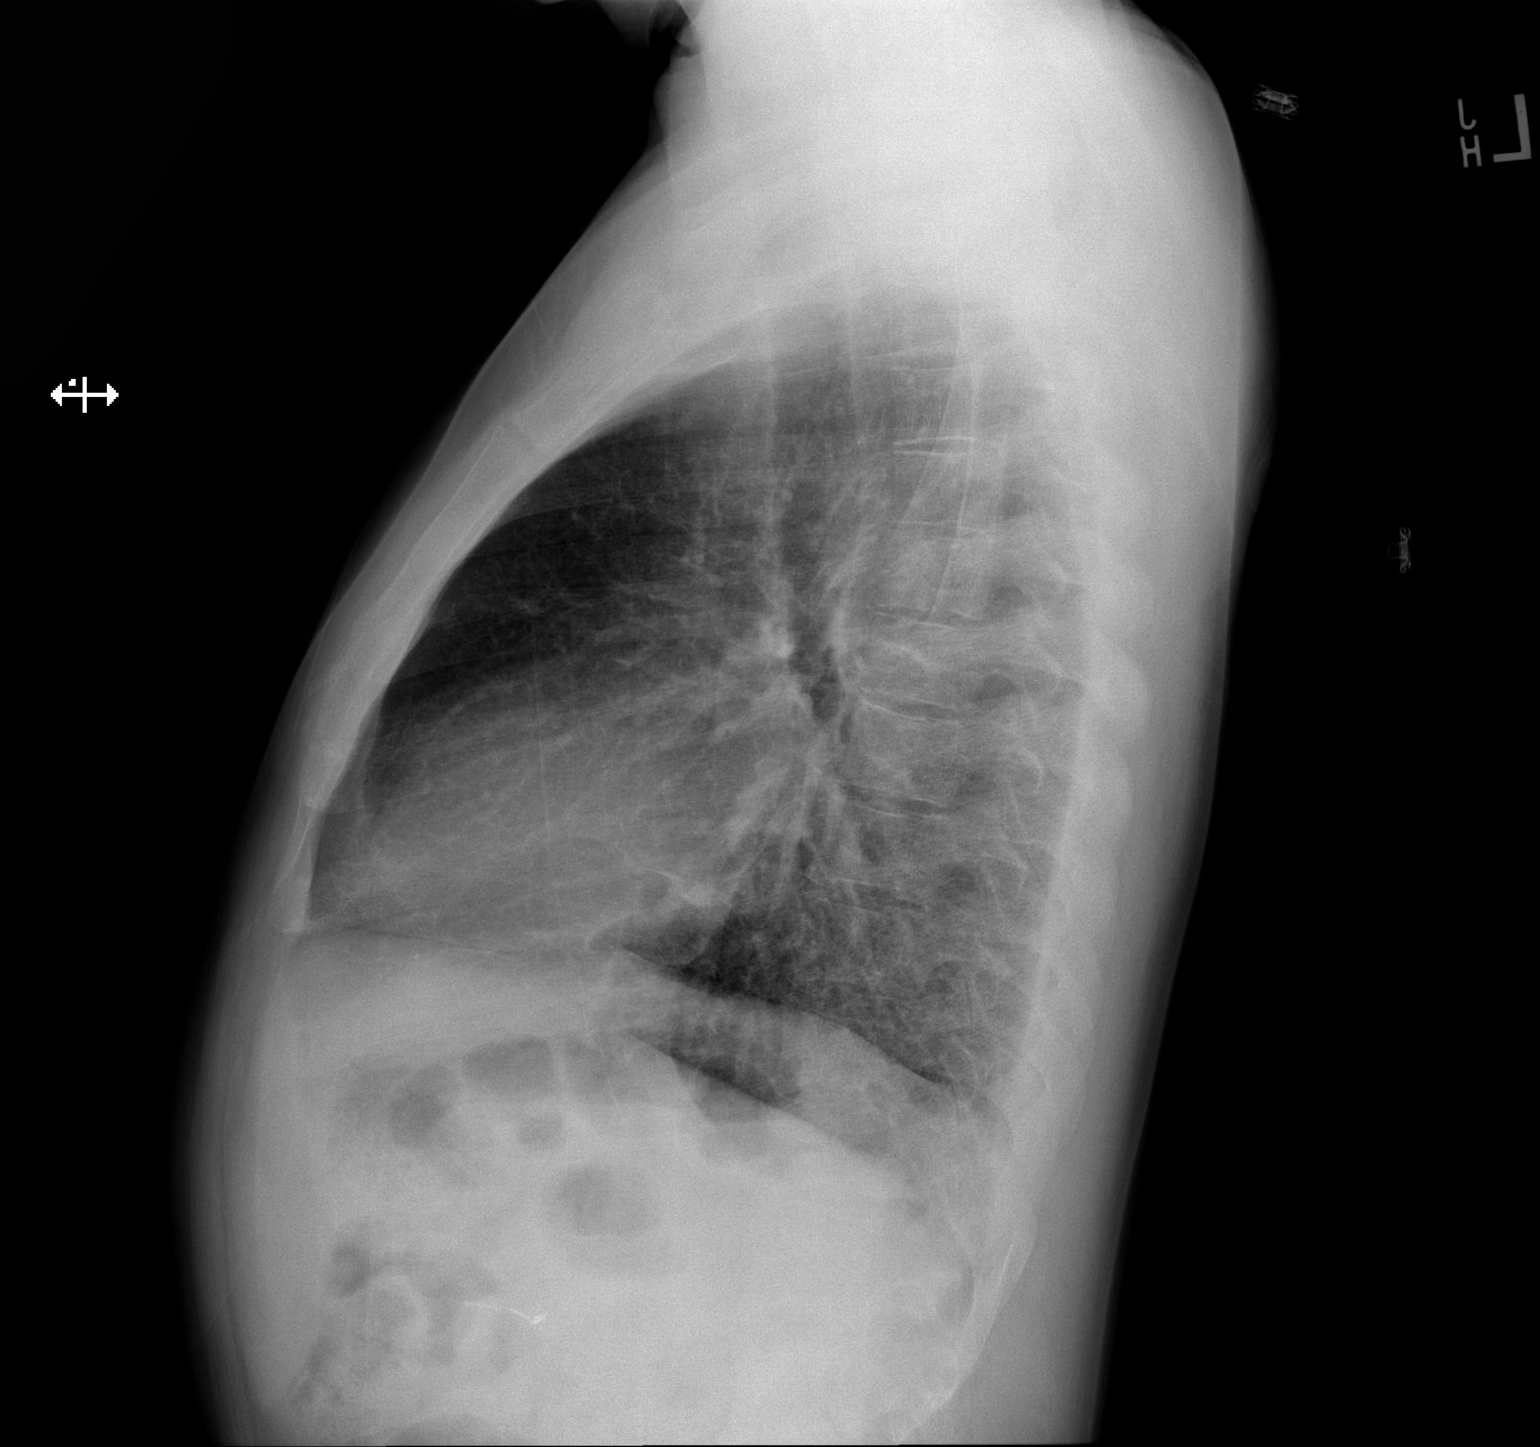

[2 of 2 positions shown; findings below may reference images not displayed]

FINDINGS: The cardiomediastinal silhouette is normal. No pneumothorax. No
focal infiltrates. The nodular density seen on the lateral view of
the previous study is no longer visualized. No acute abnormalities.
IMPRESSION: No active cardiopulmonary disease.

## 2020-05-10 ENCOUNTER — Other Ambulatory Visit: Payer: Self-pay

## 2020-05-10 ENCOUNTER — Other Ambulatory Visit: Payer: 59

## 2020-05-10 DIAGNOSIS — N184 Chronic kidney disease, stage 4 (severe): Secondary | ICD-10-CM

## 2020-05-11 ENCOUNTER — Telehealth: Payer: 59 | Admitting: Family Medicine

## 2020-05-11 LAB — CMP AND LIVER
ALT: 36 IU/L (ref 0–44)
AST: 50 IU/L — ABNORMAL HIGH (ref 0–40)
Albumin: 3.3 g/dL — ABNORMAL LOW (ref 4.0–5.0)
Alkaline Phosphatase: 318 IU/L — ABNORMAL HIGH (ref 44–121)
BUN: 23 mg/dL — ABNORMAL HIGH (ref 6–20)
Bilirubin Total: 0.7 mg/dL (ref 0.0–1.2)
Bilirubin, Direct: 0.24 mg/dL (ref 0.00–0.40)
CO2: 16 mmol/L — ABNORMAL LOW (ref 20–29)
Calcium: 7.8 mg/dL — ABNORMAL LOW (ref 8.7–10.2)
Chloride: 101 mmol/L (ref 96–106)
Creatinine, Ser: 2.75 mg/dL — ABNORMAL HIGH (ref 0.76–1.27)
Glucose: 107 mg/dL — ABNORMAL HIGH (ref 65–99)
Potassium: 4.1 mmol/L (ref 3.5–5.2)
Sodium: 136 mmol/L (ref 134–144)
Total Protein: 5.8 g/dL — ABNORMAL LOW (ref 6.0–8.5)
eGFR: 30 mL/min/{1.73_m2} — ABNORMAL LOW (ref >59)

## 2020-05-11 LAB — CBC
Hematocrit: 22.9 % — ABNORMAL LOW (ref 37.5–51.0)
Hemoglobin: 8.5 g/dL — ABNORMAL LOW (ref 13.0–17.7)
MCH: 39 pg — ABNORMAL HIGH (ref 26.6–33.0)
MCHC: 37.1 g/dL — ABNORMAL HIGH (ref 31.5–35.7)
MCV: 105 fL — ABNORMAL HIGH (ref 79–97)
Platelets: 364 10*3/uL (ref 150–450)
RBC: 2.18 x10E6/uL — CL (ref 4.14–5.80)
RDW: 19 % — ABNORMAL HIGH (ref 11.6–15.4)
WBC: 16.8 10*3/uL — ABNORMAL HIGH (ref 3.4–10.8)

## 2020-05-14 ENCOUNTER — Other Ambulatory Visit: Payer: Self-pay | Admitting: Family Medicine

## 2020-05-14 ENCOUNTER — Telehealth: Payer: Self-pay

## 2020-05-14 DIAGNOSIS — F1410 Cocaine abuse, uncomplicated: Secondary | ICD-10-CM

## 2020-05-14 DIAGNOSIS — Z79899 Other long term (current) drug therapy: Secondary | ICD-10-CM | POA: Diagnosis not present

## 2020-05-14 DIAGNOSIS — D571 Sickle-cell disease without crisis: Secondary | ICD-10-CM | POA: Diagnosis not present

## 2020-05-14 DIAGNOSIS — R7989 Other specified abnormal findings of blood chemistry: Secondary | ICD-10-CM | POA: Diagnosis not present

## 2020-05-14 DIAGNOSIS — G8929 Other chronic pain: Secondary | ICD-10-CM | POA: Diagnosis not present

## 2020-05-14 DIAGNOSIS — M25562 Pain in left knee: Secondary | ICD-10-CM | POA: Diagnosis not present

## 2020-05-14 NOTE — Progress Notes (Signed)
Patient will be getting a urine drug test only on 05/17/20. The order is in for release.

## 2020-05-14 NOTE — Progress Notes (Signed)
Orders Placed This Encounter  Procedures  . [id number] 11+Oxyco+Alc+Crt-Bund    Standing Status:   Future    Standing Expiration Date:   05/14/2021

## 2020-05-14 NOTE — Telephone Encounter (Signed)
Needs an order to get potassium checked Does he need an appt w/ you ? If so can I double book him in?

## 2020-05-17 ENCOUNTER — Other Ambulatory Visit: Payer: Self-pay

## 2020-05-17 ENCOUNTER — Other Ambulatory Visit: Payer: Medicare Other

## 2020-05-17 DIAGNOSIS — F1410 Cocaine abuse, uncomplicated: Secondary | ICD-10-CM

## 2020-05-21 ENCOUNTER — Inpatient Hospital Stay (HOSPITAL_COMMUNITY)
Admission: EM | Admit: 2020-05-21 | Discharge: 2020-05-26 | DRG: 812 | Disposition: A | Discharge status: Home / Self Care | Payer: Medicare Other | Attending: Internal Medicine | Admitting: Internal Medicine

## 2020-05-21 ENCOUNTER — Encounter (HOSPITAL_COMMUNITY): Payer: Self-pay

## 2020-05-21 ENCOUNTER — Other Ambulatory Visit: Payer: Self-pay

## 2020-05-21 DIAGNOSIS — G894 Chronic pain syndrome: Secondary | ICD-10-CM | POA: Diagnosis present

## 2020-05-21 DIAGNOSIS — Z885 Allergy status to narcotic agent status: Secondary | ICD-10-CM

## 2020-05-21 DIAGNOSIS — D72829 Elevated white blood cell count, unspecified: Secondary | ICD-10-CM | POA: Diagnosis not present

## 2020-05-21 DIAGNOSIS — K219 Gastro-esophageal reflux disease without esophagitis: Secondary | ICD-10-CM | POA: Diagnosis present

## 2020-05-21 DIAGNOSIS — F1120 Opioid dependence, uncomplicated: Secondary | ICD-10-CM | POA: Diagnosis present

## 2020-05-21 DIAGNOSIS — Z886 Allergy status to analgesic agent status: Secondary | ICD-10-CM

## 2020-05-21 DIAGNOSIS — Z803 Family history of malignant neoplasm of breast: Secondary | ICD-10-CM

## 2020-05-21 DIAGNOSIS — F1490 Cocaine use, unspecified, uncomplicated: Secondary | ICD-10-CM | POA: Diagnosis present

## 2020-05-21 DIAGNOSIS — N179 Acute kidney failure, unspecified: Secondary | ICD-10-CM | POA: Diagnosis present

## 2020-05-21 DIAGNOSIS — E875 Hyperkalemia: Secondary | ICD-10-CM | POA: Diagnosis not present

## 2020-05-21 DIAGNOSIS — D638 Anemia in other chronic diseases classified elsewhere: Secondary | ICD-10-CM | POA: Diagnosis present

## 2020-05-21 DIAGNOSIS — Z79899 Other long term (current) drug therapy: Secondary | ICD-10-CM

## 2020-05-21 DIAGNOSIS — Z20822 Contact with and (suspected) exposure to covid-19: Secondary | ICD-10-CM | POA: Diagnosis present

## 2020-05-21 DIAGNOSIS — F17210 Nicotine dependence, cigarettes, uncomplicated: Secondary | ICD-10-CM | POA: Diagnosis present

## 2020-05-21 DIAGNOSIS — N184 Chronic kidney disease, stage 4 (severe): Secondary | ICD-10-CM | POA: Diagnosis present

## 2020-05-21 DIAGNOSIS — D5700 Hb-SS disease with crisis, unspecified: Principal | ICD-10-CM | POA: Diagnosis present

## 2020-05-21 DIAGNOSIS — F1220 Cannabis dependence, uncomplicated: Secondary | ICD-10-CM | POA: Diagnosis present

## 2020-05-21 DIAGNOSIS — N289 Disorder of kidney and ureter, unspecified: Secondary | ICD-10-CM | POA: Diagnosis not present

## 2020-05-21 DIAGNOSIS — Z9049 Acquired absence of other specified parts of digestive tract: Secondary | ICD-10-CM

## 2020-05-21 DIAGNOSIS — Z87898 Personal history of other specified conditions: Secondary | ICD-10-CM | POA: Diagnosis not present

## 2020-05-21 DIAGNOSIS — F1491 Cocaine use, unspecified, in remission: Secondary | ICD-10-CM

## 2020-05-21 LAB — CBC WITH DIFFERENTIAL/PLATELET
Abs Immature Granulocytes: 0.46 10*3/uL — ABNORMAL HIGH (ref 0.00–0.07)
Basophils Absolute: 0 10*3/uL (ref 0.0–0.1)
Basophils Relative: 0 %
Eosinophils Absolute: 0.5 10*3/uL (ref 0.0–0.5)
Eosinophils Relative: 3 %
HCT: 21.2 % — ABNORMAL LOW (ref 39.0–52.0)
Hemoglobin: 7.4 g/dL — ABNORMAL LOW (ref 13.0–17.0)
Immature Granulocytes: 3 %
Lymphocytes Relative: 46 %
Lymphs Abs: 8.5 10*3/uL — ABNORMAL HIGH (ref 0.7–4.0)
MCH: 38.9 pg — ABNORMAL HIGH (ref 26.0–34.0)
MCHC: 34.9 g/dL (ref 30.0–36.0)
MCV: 111.6 fL — ABNORMAL HIGH (ref 80.0–100.0)
Monocytes Absolute: 1.3 10*3/uL — ABNORMAL HIGH (ref 0.1–1.0)
Monocytes Relative: 7 %
Neutro Abs: 7.5 10*3/uL (ref 1.7–7.7)
Neutrophils Relative %: 41 %
Platelets: 176 10*3/uL (ref 150–400)
RBC: 1.9 MIL/uL — ABNORMAL LOW (ref 4.22–5.81)
RDW: 23.9 % — ABNORMAL HIGH (ref 11.5–15.5)
WBC: 18.3 10*3/uL — ABNORMAL HIGH (ref 4.0–10.5)
nRBC: 0.5 % — ABNORMAL HIGH (ref 0.0–0.2)

## 2020-05-21 LAB — RETICULOCYTES
Immature Retic Fract: 36.6 % — ABNORMAL HIGH (ref 2.3–15.9)
RBC.: 1.92 MIL/uL — ABNORMAL LOW (ref 4.22–5.81)
Retic Count, Absolute: 161.3 10*3/uL (ref 19.0–186.0)
Retic Ct Pct: 8.4 % — ABNORMAL HIGH (ref 0.4–3.1)

## 2020-05-21 LAB — COMPREHENSIVE METABOLIC PANEL
ALT: 43 U/L (ref 0–44)
AST: 74 U/L — ABNORMAL HIGH (ref 15–41)
Albumin: 2.6 g/dL — ABNORMAL LOW (ref 3.5–5.0)
Alkaline Phosphatase: 259 U/L — ABNORMAL HIGH (ref 38–126)
Anion gap: 9 (ref 5–15)
BUN: 46 mg/dL — ABNORMAL HIGH (ref 6–20)
CO2: 17 mmol/L — ABNORMAL LOW (ref 22–32)
Calcium: 7.8 mg/dL — ABNORMAL LOW (ref 8.9–10.3)
Chloride: 113 mmol/L — ABNORMAL HIGH (ref 98–111)
Creatinine, Ser: 3.87 mg/dL — ABNORMAL HIGH (ref 0.61–1.24)
GFR, Estimated: 20 mL/min — ABNORMAL LOW (ref >60)
Glucose, Bld: 97 mg/dL (ref 70–99)
Potassium: 5.9 mmol/L — ABNORMAL HIGH (ref 3.5–5.1)
Sodium: 139 mmol/L (ref 135–145)
Total Bilirubin: 0.9 mg/dL (ref 0.3–1.2)
Total Protein: 6.2 g/dL — ABNORMAL LOW (ref 6.5–8.1)

## 2020-05-21 LAB — RAPID URINE DRUG SCREEN, HOSP PERFORMED
Amphetamines: NOT DETECTED
Barbiturates: NOT DETECTED
Benzodiazepines: NOT DETECTED
Cocaine: NOT DETECTED
Opiates: POSITIVE — AB
Tetrahydrocannabinol: POSITIVE — AB

## 2020-05-21 LAB — URINALYSIS, ROUTINE W REFLEX MICROSCOPIC
Bilirubin Urine: NEGATIVE
Glucose, UA: 50 mg/dL — AB
Ketones, ur: NEGATIVE mg/dL
Leukocytes,Ua: NEGATIVE
Nitrite: NEGATIVE
Protein, ur: >=300 mg/dL — AB
Specific Gravity, Urine: 1.01 (ref 1.005–1.030)
pH: 7 (ref 5.0–8.0)

## 2020-05-21 LAB — SARS CORONAVIRUS 2 (TAT 6-24 HRS): SARS Coronavirus 2: NEGATIVE

## 2020-05-21 MED ORDER — NALOXONE HCL 0.4 MG/ML IJ SOLN: 0.4000 mg | INTRAMUSCULAR | Status: DC | PRN

## 2020-05-21 MED ORDER — VOXELOTOR 500 MG PO TABS: 500.0000 mg | ORAL_TABLET | Freq: Every day | ORAL | Status: DC

## 2020-05-21 MED ORDER — DEXTROSE-NACL 5-0.45 % IV SOLN
INTRAVENOUS | Status: DC
Start: 2020-05-21 — End: 2020-05-21

## 2020-05-21 MED ORDER — NORTRIPTYLINE HCL 25 MG PO CAPS
25.0000 mg | ORAL_CAPSULE | Freq: Two times a day (BID) | ORAL | Status: DC
  Administered 2020-05-21 – 2020-05-26 (×10): 25 mg via ORAL
  Filled 2020-05-21 (×10): qty 1

## 2020-05-21 MED ORDER — HYDROMORPHONE HCL 2 MG/ML IJ SOLN
2.0000 mg | INTRAMUSCULAR | Status: AC
Start: 2020-05-21 — End: 2020-05-21
  Administered 2020-05-21: 2 mg via INTRAVENOUS
  Filled 2020-05-21: qty 1

## 2020-05-21 MED ORDER — POLYETHYLENE GLYCOL 3350 17 G PO PACK: 17.0000 g | PACK | Freq: Every day | ORAL | Status: DC | PRN

## 2020-05-21 MED ORDER — SODIUM CHLORIDE 0.9% FLUSH: 9.0000 mL | INTRAVENOUS | Status: DC | PRN

## 2020-05-21 MED ORDER — OXYCODONE HCL 5 MG PO TABS
15.0000 mg | ORAL_TABLET | Freq: Once | ORAL | Status: AC
  Administered 2020-05-21: 15 mg via ORAL
  Filled 2020-05-21: qty 3

## 2020-05-21 MED ORDER — SODIUM BICARBONATE 650 MG PO TABS
650.0000 mg | ORAL_TABLET | Freq: Two times a day (BID) | ORAL | Status: DC
  Administered 2020-05-22 – 2020-05-26 (×9): 650 mg via ORAL
  Filled 2020-05-21 (×11): qty 1

## 2020-05-21 MED ORDER — OXYCODONE HCL 5 MG PO TABS
10.0000 mg | ORAL_TABLET | Freq: Four times a day (QID) | ORAL | Status: DC | PRN
Start: 2020-05-21 — End: 2020-05-26
  Administered 2020-05-21 – 2020-05-22 (×4): 10 mg via ORAL
  Filled 2020-05-21 (×4): qty 2

## 2020-05-21 MED ORDER — DIPHENHYDRAMINE HCL 50 MG/ML IJ SOLN
12.5000 mg | Freq: Once | INTRAMUSCULAR | Status: AC
  Administered 2020-05-21: 12.5 mg via INTRAVENOUS
  Filled 2020-05-21: qty 1

## 2020-05-21 MED ORDER — ENOXAPARIN SODIUM 30 MG/0.3ML ~~LOC~~ SOLN
30.0000 mg | SUBCUTANEOUS | Status: DC
  Administered 2020-05-21 – 2020-05-23 (×3): 30 mg via SUBCUTANEOUS
  Filled 2020-05-21 (×4): qty 0.3

## 2020-05-21 MED ORDER — ONDANSETRON HCL 4 MG/2ML IJ SOLN
4.0000 mg | Freq: Once | INTRAMUSCULAR | Status: AC
  Administered 2020-05-21: 4 mg via INTRAVENOUS
  Filled 2020-05-21: qty 2

## 2020-05-21 MED ORDER — HYDROMORPHONE 1 MG/ML IV SOLN
INTRAVENOUS | Status: DC
  Administered 2020-05-21: 1 mg via INTRAVENOUS
  Administered 2020-05-21: 30 mg via INTRAVENOUS
  Administered 2020-05-21: 1 mg via INTRAVENOUS
  Administered 2020-05-22: 2.5 mg via INTRAVENOUS
  Administered 2020-05-22 (×2): 1 mg via INTRAVENOUS
  Filled 2020-05-21: qty 30

## 2020-05-21 MED ORDER — ALLOPURINOL 100 MG PO TABS
100.0000 mg | ORAL_TABLET | Freq: Every day | ORAL | Status: DC
  Administered 2020-05-21 – 2020-05-26 (×6): 100 mg via ORAL
  Filled 2020-05-21 (×6): qty 1

## 2020-05-21 MED ORDER — OXYCODONE HCL ER 15 MG PO T12A
15.0000 mg | EXTENDED_RELEASE_TABLET | Freq: Two times a day (BID) | ORAL | Status: DC
  Administered 2020-05-21 – 2020-05-26 (×10): 15 mg via ORAL
  Filled 2020-05-21 (×10): qty 1

## 2020-05-21 MED ORDER — VITAMIN D 25 MCG (1000 UNIT) PO TABS
2000.0000 [IU] | ORAL_TABLET | Freq: Every day | ORAL | Status: DC
  Administered 2020-05-21 – 2020-05-26 (×6): 2000 [IU] via ORAL
  Filled 2020-05-21 (×7): qty 2

## 2020-05-21 MED ORDER — SODIUM ZIRCONIUM CYCLOSILICATE 10 G PO PACK
10.0000 g | PACK | ORAL | Status: DC
  Administered 2020-05-21 – 2020-05-25 (×3): 10 g via ORAL
  Filled 2020-05-21 (×3): qty 1

## 2020-05-21 MED ORDER — FOLIC ACID 1 MG PO TABS
1.0000 mg | ORAL_TABLET | Freq: Every day | ORAL | Status: DC
  Administered 2020-05-21 – 2020-05-26 (×6): 1 mg via ORAL
  Filled 2020-05-21 (×6): qty 1

## 2020-05-21 MED ORDER — ENOXAPARIN SODIUM 40 MG/0.4ML ~~LOC~~ SOLN
40.0000 mg | SUBCUTANEOUS | Status: DC
  Filled 2020-05-21: qty 0.4

## 2020-05-21 MED ORDER — SENNOSIDES-DOCUSATE SODIUM 8.6-50 MG PO TABS
1.0000 | ORAL_TABLET | Freq: Two times a day (BID) | ORAL | Status: DC
  Administered 2020-05-21 – 2020-05-26 (×5): 1 via ORAL
  Filled 2020-05-21 (×10): qty 1

## 2020-05-21 NOTE — Progress Notes (Signed)
IVT: Seen pt as per consult by RN; stated line keeps "beeping". Assessed IV access, flushed well with good blood return. Spoke with pt,IV access maybe positional when arm is flexed. IVF infusing well when I left the room.
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
## 2020-05-21 NOTE — Discharge Instructions (Addendum)
Review of narcotic/opioid database reveals you filled 15 days worth of oxycodone on 04/29/2020. You should have had this medicine until 05/13/2020.   Please call sickle cell clinic to discuss future refills on your medicines  Resume all of your medicines as previously prescribed   Stay hydrated, drink 64 oz of water daily. Avoid dehydration. Avoid any stressors that could precipitate a sickle cell crisis  Your kidney function is abnormal and your potassium is high - this is not new, please follow up with nephrology (kidney doctor)  Return for chest pain, shortness of breath, severe headache, stroke symptoms

## 2020-05-21 NOTE — ED Triage Notes (Signed)
Pt to ED with c/o SCC. Pt woke up in extreme pain this morning. Pain is all over, however states it is worse in legs and back. Arrives in obvious pain, VS are elevated.

## 2020-05-21 NOTE — H&P (Addendum)
H&P  Patient Demographics:  [name] [name], is a 40 y.o. male  MRN: [id number]   DOB - 1980-12-06  Admit Date - 05/21/2020  Outpatient Primary MD for the patient is [name], FNP  Chief Complaint  Patient presents with  . Sickle Cell Pain Crisis      HPI:   [name] [name]  is a 40 y.o. male with a medical history significant for sickle cell disease, chronic pain syndrome, opiate dependence and tolerance, history of polysubstance abuse, history of anemia of chronic disease, and history of CKD stage IV presented to the emergency department with ongoing pain to low back and lower extremities that has been present over the past several days.  He also says that he has had increased pain on and off for 3 weeks due to the fact that he does not have home opiate medications.  Patient failed a recent drug test and has been unable to obtain prescriptions for his chronic pain medications.  He also attributes increased pain to changes in weather.  He characterizes his pain as constant and throbbing.  He denies any fever, chills, chest pain, or shortness of breath.  No fatigue, urinary symptoms, nausea, vomiting, or diarrhea.  He denies any recent travel, sick contacts, or exposure to COVID-19.  Patient denies any current illicit drug use. Of note, patient is very belligerently discussed history and pain medications.    Emergency room course: Vital signs recorded as:BP 127/90 (BP Location: Right Arm)   Pulse 98   Temp 98 F (36.7 C) (Oral)   Resp 18   Ht '5\' 3"'$  (1.6 m)   Wt 54 kg   SpO2 98%   BMI 21.09 kg/m .  Complete blood count shows WBCs 18.3, hemoglobin 7.4, and platelets 176.  Comprehensive metabolic panel shows potassium 5.9, chloride 113, CO2 17, creatinine 3.87.  AST 74, ALT 43, alkaline phos 259, and total bilirubin 0.9.  Absolute reticulocyte is 161.3.  COVID-19 test pending.  Patient's pain persists despite IV Dilaudid and IV fluids.  Sickle cell team notified for admission.   Patient admitted to [facility] for further management of sickle cell pain crisis.    Review of systems:  Review of Systems  Constitutional: Negative.   HENT: Negative.   Eyes: Negative.   Respiratory: Negative.   Cardiovascular: Negative.   Gastrointestinal: Negative.   Genitourinary: Negative.  Negative for frequency and hematuria.  Musculoskeletal: Positive for back pain and joint pain.  Skin: Negative.   Neurological: Negative.   Psychiatric/Behavioral: Negative.      With Past History of the following :   Past Medical History:  Diagnosis Date  . Abscess of right iliac fossa 09/24/2018   required Perc Drain 09/24/2018  . Arachnoid Cyst of brain bilaterally    "2 really small ones in the back of my head; inside; saw them w/MRI" (09/25/2012)  . Bacterial pneumonia ~ 2012   "caught it here in the hospital" (09/25/2012)  . Chronic kidney disease    "from my sickle cell" (09/25/2012)  . CKD (chronic kidney disease), stage II   . Colitis 04/19/2017   CT scan abd/ pelvis  . GERD (gastroesophageal reflux disease)    "after I eat alot of spicey foods" (09/25/2012)  . Gynecomastia, male 07/10/2012  . History of blood transfusion    "always related to sickle cell crisis" (09/25/2012)  . 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   Noted in noted from Hematology notes at [facility]  . Migraines    "take  RX qd to prevent them" (09/25/2012)  . Opioid dependence with withdrawal ([place]) 08/30/2012  . Renal insufficiency   . Sickle cell anemia (HCC)   . Sickle cell crisis ([place]) 09/25/2012  . Sickle cell nephropathy ([place]) 07/10/2012  . Sinus tachycardia   . Tachycardia with heart rate 121-140 beats per minute with ambulation 08/04/2016      Past Surgical History:  Procedure Laterality Date  . ABCESS DRAINAGE    . CHOLECYSTECTOMY  ~ 2012  . COLONOSCOPY N/A 04/23/2017   Procedure: COLONOSCOPY;  Surgeon: Irene Shipper, MD;  Location: [facility];  Service: Endoscopy;  Laterality: N/A;   . IR FLUORO GUIDE CV LINE RIGHT  12/17/2016  . IR RADIOLOGIST EVAL & MGMT  10/02/2018  . IR RADIOLOGIST EVAL & MGMT  10/15/2018  . IR REMOVAL TUN CV CATH W/O FL  12/21/2016  . IR US GUIDE VASC ACCESS RIGHT  12/17/2016  . spleenectomy       Social History:   Social History   Tobacco Use  . Smoking status: Current Some Day Smoker    Packs/day: 0.10    Types: Cigarettes  . Smokeless tobacco: Never Used  . Tobacco comment: 09/25/2012 "I don't buy cigarettes; bum one from friends q now and then"  Substance Use Topics  . Alcohol use: Yes    Alcohol/week: 0.0 standard drinks    Comment: Once a month     Lives - At home   Family History :   Family History  Problem Relation Age of Onset  . Breast cancer Mother      Home Medications:   Prior to Admission medications   Medication Sig Start Date End Date Taking? Authorizing Provider  acetaminophen (TYLENOL) 500 MG tablet Take 1 tablet (500 mg total) by mouth every 6 (six) hours as needed. Patient taking differently: Take 500 mg by mouth every 6 (six) hours as needed for mild pain. 03/26/19   [name], FNP  allopurinol (ZYLOPRIM) 100 MG tablet Take 100 mg by mouth daily. 02/12/20   [provider]  Cholecalciferol (VITAMIN D3) 50 MCG (2000 UT) TABS Take 1 each by mouth daily. Patient taking differently: Take 2,000 Units by mouth daily. 01/13/20   [name], FNP  folic acid (FOLVITE) 1 MG tablet TAKE 1 TABLET BY MOUTH EVERY DAY Patient taking differently: Take 1 mg by mouth daily. 03/22/20   [name], FNP  LOKELMA 10 g PACK packet Take 10 g by mouth every other day. 03/18/20   [provider]  nortriptyline (PAMELOR) 25 MG capsule TAKE 1 CAPSULE BY MOUTH 2 TIMES DAILY. Patient taking differently: Take 25 mg by mouth 2 (two) times daily. 02/03/20   [name], FNP  OXBRYTA 500 MG TABS tablet Take 500 mg by mouth daily. 03/26/20   [provider]  oxyCODONE (OXYCONTIN) 15 mg 12 hr  tablet Take 1 tablet (15 mg total) by mouth every 12 (twelve) hours. 04/30/20   [name], FNP  oxyCODONE (OXYCONTIN) 15 mg 12 hr tablet TAKE 1 TABLET BY MOUTH EVERY 12 HOURS. FILL AFTER 04/02/20 04/01/20 09/28/20  [name], FNP  oxyCODONE (OXYCONTIN) 15 mg 12 hr tablet TAKE 1 TABLET BY MOUTH EVERY 12 HOURS 04/27/20 10/24/20  [name], FNP  Oxycodone HCl 10 MG TABS Take 1 tablet (10 mg total) by mouth every 6 (six) hours as needed. 04/29/20   [name], FNP  Oxycodone HCl 10 MG TABS TAKE 1 TABLET BY  MOUTH EVERY 6 HOURS AS NEEDED 04/27/20 10/24/20  [name], FNP  Oxycodone HCl 10 MG TABS TAKE 1 TABLET BY MOUTH EVERY 6 HOURS AS NEEDED (04/15/20) 04/13/20 10/10/20  [name], FNP  sodium bicarbonate 650 MG tablet Take 650 mg by mouth 2 (two) times daily. 11/07/18   [provider]     Allergies:   Allergies  Allergen Reactions  . Nsaids Other (See Comments)    Kidney disease  . Morphine And Related Other (See Comments)    "Real bad headaches"      Physical Exam:   Vitals:   Vitals:   05/21/20 1127 05/21/20 1145  BP:  138/88  Pulse: 92 (!) 113  Resp: 10 16  Temp:    SpO2: 99% 98%    Physical Exam: Constitutional: Patient appears well-developed and well-nourished. Not in obvious distress. HENT: Normocephalic, atraumatic, External right and left ear normal. Oropharynx is clear and moist.  Eyes: Conjunctivae and EOM are normal. PERRLA, no scleral icterus. Neck: Normal ROM. Neck supple. No JVD. No tracheal deviation. No thyromegaly. CVS: RRR, S1/S2 +, Murmur present, no gallops, no carotid bruit.  Pulmonary: Effort and breath sounds normal, no stridor, rhonchi, wheezes, rales.  Abdominal: Soft. BS +, no distension, tenderness, rebound or guarding.  Musculoskeletal: Normal range of motion. No edema and no tenderness.  Lymphadenopathy: No lymphadenopathy noted, cervical, inguinal or axillary Neuro: Alert. Normal reflexes, muscle tone  coordination. No cranial nerve deficit. Skin: Skin is warm and dry. No rash noted. Not diaphoretic. No erythema. No pallor. Psychiatric: Normal mood and affect. Behavior, judgment, thought content normal.   Data Review:   CBC Recent Labs  Lab 05/21/20 0717  WBC 18.3*  HGB 7.4*  HCT 21.2*  PLT 176  MCV 111.6*  MCH 38.9*  MCHC 34.9  RDW 23.9*  LYMPHSABS 8.5*  MONOABS 1.3*  EOSABS 0.5  BASOSABS 0.0   ------------------------------------------------------------------------------------------------------------------  Chemistries  Recent Labs  Lab 05/21/20 0717  NA 139  K 5.9*  CL 113*  CO2 17*  GLUCOSE 97  BUN 46*  CREATININE 3.87*  CALCIUM 7.8*  AST 74*  ALT 43  ALKPHOS 259*  BILITOT 0.9   ------------------------------------------------------------------------------------------------------------------ estimated creatinine clearance is 20 mL/min (A) (by C-G formula based on SCr of 3.87 mg/dL (H)). ------------------------------------------------------------------------------------------------------------------ No results for input(s): TSH, T4TOTAL, T3FREE, THYROIDAB in the last 72 hours.  Invalid input(s): FREET3  Coagulation profile No results for input(s): INR, PROTIME in the last 168 hours. ------------------------------------------------------------------------------------------------------------------- No results for input(s): DDIMER in the last 72 hours. -------------------------------------------------------------------------------------------------------------------  Cardiac Enzymes No results for input(s): CKMB, TROPONINI, MYOGLOBIN in the last 168 hours.  Invalid input(s): CK ------------------------------------------------------------------------------------------------------------------ No results found for: BNP  ---------------------------------------------------------------------------------------------------------------  Urinalysis     Component Value Date/Time   COLORURINE YELLOW 04/29/2020 1100   APPEARANCEUR CLEAR 04/29/2020 1100   LABSPEC 1.008 04/29/2020 1100   PHURINE 8.0 04/29/2020 1100   GLUCOSEU 50 (A) 04/29/2020 1100   HGBUR NEGATIVE 04/29/2020 1100   HGBUR small 03/10/2010 1445   BILIRUBINUR neg 05/03/2020 0907   KETONESUR NEGATIVE 04/29/2020 1100   PROTEINUR Negative 05/03/2020 0907   PROTEINUR >=300 (A) 04/29/2020 1100   UROBILINOGEN 0.2 05/03/2020 0907   UROBILINOGEN 0.2 05/11/2017 1335   NITRITE neg 05/03/2020 0907   NITRITE NEGATIVE 04/29/2020 1100   LEUKOCYTESUR Negative 05/03/2020 0907   LEUKOCYTESUR NEGATIVE 04/29/2020 1100    ----------------------------------------------------------------------------------------------------------------   Imaging Results:    No results found.   Assessment & Plan:  Active Problems:  CKD (chronic kidney disease), stage IV (HCC)   Sickle cell pain crisis ([place])   Leukocytosis   Chronic pain syndrome   History of cocaine use  Sickle cell disease with pain crisis: Admit to [facility].  Initiate IV fluids, 0.45% saline at 100 mL/h Patient opiate tolerant, initiate IV Dilaudid PCA with settings of 0.5 mg, 10-minute lockout, and 3 mg/h. Initiate home medications, OxyContin 50 mg every 12 hours. Hold oxycodone, use PCA Dilaudid Monitor vital signs very closely, reevaluate pain scale regularly, and supplemental oxygen as needed.  Leukocytosis: WBCs 18.3.  Thought to be reactive secondary to sickle cell crisis.  COVID-19 test pending.  Urinalysis and urine culture pending.  Patient is afebrile without signs of active infection.  Follow closely without antibiotics.  Repeat CBC in AM.  Sickle cell anemia: Patient's hemoglobin is 7.4, which is consistent with his baseline.  He is followed by [facility] hematology who was recently prescribed Oxbryta.  Patient also on hydroxyurea.  Hemoglobin has remained above baseline over the past several months.  Continue to follow  closely.  Transfuse conservatively.  Patient is typically not transfused unless hemoglobin is less than 6.0 g/dL.  Acute kidney injury superimposed on stage IV chronic kidney disease: Patient's creatinine is 3.87, which is increased from 2.75 on 05/17/2020.  Patient is followed monthly by nephrology he says that he has been taking bicarbonate and Lokelma per nephrology over the past 3 weeks. Avoid nephrotoxins Gentle hydration Trend creatinine  Hyperkalemia Potassium currently 5.9.  Continue Lokelma every other day.  Follow labs closely.  History of cocaine use: Reviewed urine drug screen, negative for cocaine.          DVT Prophylaxis: Subcut Lovenox   AM Labs Ordered, also please review Full Orders  Family Communication: Admission, patient's condition and plan of care including tests being ordered have been discussed with the patient who indicate understanding and agree with the plan and Code Status.  Code Status: Full Code  Consults called: None    Admission status: Inpatient    Time spent in minutes : 50 minutes  [place], MSN, FNP-C [facility] [address] [place], [place] [address] [phone]  05/21/2020 at 11:57 AM

## 2020-05-22 MED ORDER — HYDROMORPHONE 1 MG/ML IV SOLN
INTRAVENOUS | Status: DC
  Administered 2020-05-22: 1.5 mg via INTRAVENOUS
  Administered 2020-05-23: 1 mg via INTRAVENOUS
  Administered 2020-05-23: 0.5 mg via INTRAVENOUS
  Administered 2020-05-23: 2.5 mg via INTRAVENOUS
  Administered 2020-05-24: 1.5 mg via INTRAVENOUS
  Administered 2020-05-24: 2 mg via INTRAVENOUS
  Administered 2020-05-24: 1.5 mg via INTRAVENOUS
  Administered 2020-05-24: 30 mg via INTRAVENOUS
  Administered 2020-05-24: 2.5 mg via INTRAVENOUS
  Administered 2020-05-25: 3 mg via INTRAVENOUS
  Administered 2020-05-25 (×2): 1.5 mg via INTRAVENOUS
  Filled 2020-05-22: qty 30

## 2020-05-22 MED ORDER — ONDANSETRON HCL 4 MG/2ML IJ SOLN
4.0000 mg | Freq: Four times a day (QID) | INTRAMUSCULAR | Status: DC | PRN
  Administered 2020-05-22 (×2): 4 mg via INTRAVENOUS
  Filled 2020-05-22 (×2): qty 2

## 2020-05-22 MED ORDER — HYDROMORPHONE HCL 1 MG/ML IJ SOLN: 1.0000 mg | INTRAMUSCULAR | Status: DC | PRN

## 2020-05-22 NOTE — Progress Notes (Signed)
PCA paused, IVA infiltrated, 2 attempts by IV nurse to regain same unsuccessful.

## 2020-05-22 NOTE — Plan of Care (Signed)
  Problem: Education: Goal: Knowledge of vaso-occlusive preventative measures will improve Outcome: Progressing   Problem: Tissue Perfusion: Goal: Complications related to inadequate tissue perfusion will be avoided or minimized Outcome: Progressing

## 2020-05-22 NOTE — Progress Notes (Signed)
Pt. Requested midline.  I explained that it would require order from nephrologist.  Pt. States his nephrologist is at [facility].  RN paged primary MD.  No return call.  Pt agreeable to 1.88 inch piv cath with ulrasound.

## 2020-05-22 NOTE — Progress Notes (Signed)
   05/22/20 1027  Assess: MEWS Score  Temp 98.6 F (37 C)  BP 134/84  Pulse Rate (!) 119  Resp 20  SpO2 92 %  O2 Device Room Air  Assess: MEWS Score  MEWS Temp 0  MEWS Systolic 0  MEWS Pulse 2  MEWS RR 0  MEWS LOC 0  MEWS Score 2  MEWS Score Color Yellow  Assess: if the MEWS score is Yellow or Red  Were vital signs taken at a resting state? Yes  Focused Assessment No change from prior assessment  Early Detection of Sepsis Score *See Row Information* Low  MEWS guidelines implemented *See Row Information* Yes  Take Vital Signs  Increase Vital Sign Frequency  Yellow: Q 2hr X 2 then Q 4hr X 2, if remains yellow, continue Q 4hrs  Escalate  MEWS: Escalate Yellow: discuss with charge nurse/RN and consider discussing with provider and RRT  Notify: Charge Nurse/RN  Name of Charge Nurse/RN Notified Kim, RN  Date Charge Nurse/RN Notified 05/22/20  Time Charge Nurse/RN Notified 1043    Patient is nauseous. Requested MD for nausea medication. MD ordered zofran '4mg'$  every 6 hours PRN.

## 2020-05-22 NOTE — Progress Notes (Signed)
IV infiltrated after iv dilaudid resumed..  Attempt another iv start in rua.  Pt  Stated arm and fingers numb.  Removed needle immediately.  Attempted once again in defferent spot. Pt. C/o same feeling.  Needle removed immediately. Pt refuses further attempts from this nurse.

## 2020-05-22 NOTE — Progress Notes (Signed)
Subjective: A 40 year old gentleman with history of sickle cell disease tobacco abuse and polysubstance abuse admitted yesterday with sickle cell painful crisis.  Patient is seen today [facility].  He apparently has been denied his home regimen due to positive cocaine back in February.  Patient said he has remained clean beyond this.Marland Kitchen  He is still on Dilaudid PCA in the hospital but more worried about discharge medications.  No fever or chills no nausea vomiting or diarrhea.  Objective: Vital signs in last 24 hours: Temp:  [98.6 F (37 C)-99.2 F (37.3 C)] 98.6 F (37 C) (04/09 1027) Pulse Rate:  [106-119] 119 (04/09 1027) Resp:  [15-20] 20 (04/09 1027) BP: (124-134)/(68-84) 134/84 (04/09 1027) SpO2:  [92 %-97 %] 92 % (04/09 1027) Weight change:  Last BM Date: 05/21/20  Intake/Output from previous day: 04/08 0701 - 04/09 0700 In: 1110 [P.O.:660; I.V.:450] Out: 600 [Urine:600] Intake/Output this shift: Total I/O In: -  Out: 400 [Urine:400]  General appearance: alert, cooperative, appears stated age and no distress Neck: no adenopathy, no carotid bruit, no JVD, supple, symmetrical, trachea midline and thyroid not enlarged, symmetric, no tenderness/mass/nodules Back: symmetric, no curvature. ROM normal. No CVA tenderness. Resp: clear to auscultation bilaterally Cardio: regular rate and rhythm, S1, S2 normal, no murmur, click, rub or gallop GI: soft, non-tender; bowel sounds normal; no masses,  no organomegaly Extremities: extremities normal, atraumatic, no cyanosis or edema Pulses: 2+ and symmetric Skin: Skin color, texture, turgor normal. No rashes or lesions Neurologic: Grossly normal  Lab Results: Recent Labs    05/21/20 0717  WBC 18.3*  HGB 7.4*  HCT 21.2*  PLT 176   BMET Recent Labs    05/21/20 0717  NA 139  K 5.9*  CL 113*  CO2 17*  GLUCOSE 97  BUN 46*  CREATININE 3.87*  CALCIUM 7.8*    Studies/Results: No results found.  Medications: I have reviewed the  patient's current medications.  Assessment/Plan: 40 year old gentleman admitted with sickle cell painful crisis.  #1 sickle cell painful crisis: Patient will continue Dilaudid PCA Toradol and IV fluids.  Restarted on long-acting home regimen.  We will restart his short acting prior to discharge.  Will reevaluate patient for possible prescriptions at time of discharge.  #2 acute on chronic kidney injury: Patient has chronic kidney disease stage IV.  Renal function slightly worse than baseline but so far remained stable.  Continue monitoring.  Creatinine is at 3.87.  #3 hyperkalemia: Patient on Lokelma every other day.  This has been chronic due to his chronic kidney disease.  #4 anemia of chronic disease: Hemoglobin down to 7.4.  This is close to his baseline.  Continue monitor  #5 polysubstance abuse: Urine drug screen yesterday is positive for only THC and opiates.  Counseling provided.  #6 leukocytosis: Secondary to vaso-occlusive crisis.  Continue monitoring  LOS: 1 day   Stefany Starace,LAWAL 05/22/2020, 2:37 PM

## 2020-05-23 ENCOUNTER — Other Ambulatory Visit: Payer: Self-pay | Admitting: Family Medicine

## 2020-05-23 DIAGNOSIS — D72829 Elevated white blood cell count, unspecified: Secondary | ICD-10-CM

## 2020-05-23 LAB — CBC
HCT: 19.7 % — ABNORMAL LOW (ref 39.0–52.0)
Hemoglobin: 7 g/dL — ABNORMAL LOW (ref 13.0–17.0)
MCH: 39.1 pg — ABNORMAL HIGH (ref 26.0–34.0)
MCHC: 35.5 g/dL (ref 30.0–36.0)
MCV: 110.1 fL — ABNORMAL HIGH (ref 80.0–100.0)
Platelets: 134 10*3/uL — ABNORMAL LOW (ref 150–400)
RBC: 1.79 MIL/uL — ABNORMAL LOW (ref 4.22–5.81)
RDW: 26.5 % — ABNORMAL HIGH (ref 11.5–15.5)
WBC: 19.1 10*3/uL — ABNORMAL HIGH (ref 4.0–10.5)
nRBC: 2 % — ABNORMAL HIGH (ref 0.0–0.2)

## 2020-05-23 LAB — URINE CULTURE: Culture: NO GROWTH

## 2020-05-23 NOTE — Progress Notes (Signed)
Patient has not used his PCA dilaudid during day shift with this RN.

## 2020-05-23 NOTE — Progress Notes (Signed)
Patient is refusing vitals to be taken. Will try again later.

## 2020-05-23 NOTE — Progress Notes (Signed)
Spoke with Lenna Sciara, RN for pt this shift. Order was placed for a  midline catheter, however, this pt is a renal patient, and not a candidate. Pt has very poor vasculature.Educated the RN regarding pt's current renal status, and instructed the RN to call the hospitalist if the need arises for further orders.  with verbal understanding.

## 2020-05-23 NOTE — Progress Notes (Signed)
Subjective: Patient still having pain he said at 9 out of 10.  They have been struggling to get an IV line in the patient.  He has been pulling out the existing lines.  He managed to have one peripheral line but tenuous.  Patient has been worried about outpatient medications at time of discharge.  He otherwise has no other significant findings.  No fever or chills no nausea vomiting or diarrhea.  Objective: Vital signs in last 24 hours: Temp:  [98.1 F (36.7 C)-98.6 F (37 C)] 98.5 F (36.9 C) (04/10 0505) Pulse Rate:  [63-119] 63 (04/10 0505) Resp:  [16-20] 17 (04/10 0505) BP: (118-134)/(70-85) 124/79 (04/10 0505) SpO2:  [90 %-97 %] 97 % (04/10 0505) FiO2 (%):  [0 %] 0 % (04/10 0420) Weight change:  Last BM Date: 05/21/20  Intake/Output from previous day: 04/09 0701 - 04/10 0700 In: 122.5 [P.O.:120; I.V.:2.5] Out: 1200 [Urine:1200] Intake/Output this shift: No intake/output data recorded.  General appearance: alert, cooperative, appears stated age and no distress Neck: no adenopathy, no carotid bruit, no JVD, supple, symmetrical, trachea midline and thyroid not enlarged, symmetric, no tenderness/mass/nodules Back: symmetric, no curvature. ROM normal. No CVA tenderness. Resp: clear to auscultation bilaterally Cardio: regular rate and rhythm, S1, S2 normal, no murmur, click, rub or gallop GI: soft, non-tender; bowel sounds normal; no masses,  no organomegaly Extremities: extremities normal, atraumatic, no cyanosis or edema Pulses: 2+ and symmetric Skin: Skin color, texture, turgor normal. No rashes or lesions Neurologic: Grossly normal  Lab Results: Recent Labs    05/21/20 0717 05/23/20 0652  WBC 18.3* 19.1*  HGB 7.4* 7.0*  HCT 21.2* 19.7*  PLT 176 134*   BMET Recent Labs    05/21/20 0717  NA 139  K 5.9*  CL 113*  CO2 17*  GLUCOSE 97  BUN 46*  CREATININE 3.87*  CALCIUM 7.8*    Studies/Results: No results found.  Medications: I have reviewed the patient's  current medications.  Assessment/Plan: 40 year old gentleman admitted with sickle cell painful crisis.  #1 sickle cell painful crisis: Patient is improving but not at goal.  Patient will continue Dilaudid PCA Toradol and IV fluids.  Restarted on long-acting home regimen.  We will restart his short acting prior to discharge.  Will reevaluate patient for possible prescriptions at time of discharge.  #2 acute on chronic kidney injury: Patient has chronic kidney disease stage IV.  Renal function slightly worse than baseline but so far remained stable.  Continue monitoring.  Creatinine is at 3.87.  #3 hyperkalemia: Patient on Lokelma every other day.  This has been chronic due to his chronic kidney disease.  #4 anemia of chronic disease: Hemoglobin down to 7.4.  This is close to his baseline.  Continue monitor  #5 polysubstance abuse: Urine drug screen yesterday is positive for only THC and opiates.  Counseling provided.  #6 leukocytosis: Secondary to vaso-occlusive crisis.  Continue monitoring  LOS: 2 days   [name] [name],LAWAL 05/23/2020, 9:04 AM

## 2020-05-24 DIAGNOSIS — N289 Disorder of kidney and ureter, unspecified: Secondary | ICD-10-CM | POA: Diagnosis not present

## 2020-05-24 DIAGNOSIS — D5700 Hb-SS disease with crisis, unspecified: Secondary | ICD-10-CM | POA: Diagnosis not present

## 2020-05-24 DIAGNOSIS — E875 Hyperkalemia: Secondary | ICD-10-CM | POA: Diagnosis not present

## 2020-05-24 DIAGNOSIS — G894 Chronic pain syndrome: Secondary | ICD-10-CM | POA: Diagnosis not present

## 2020-05-24 LAB — CBC
HCT: 18.2 % — ABNORMAL LOW (ref 39.0–52.0)
Hemoglobin: 6.4 g/dL — CL (ref 13.0–17.0)
MCH: 39 pg — ABNORMAL HIGH (ref 26.0–34.0)
MCHC: 35.2 g/dL (ref 30.0–36.0)
MCV: 111 fL — ABNORMAL HIGH (ref 80.0–100.0)
Platelets: 141 10*3/uL — ABNORMAL LOW (ref 150–400)
RBC: 1.64 MIL/uL — ABNORMAL LOW (ref 4.22–5.81)
RDW: 28.4 % — ABNORMAL HIGH (ref 11.5–15.5)
WBC: 18.1 10*3/uL — ABNORMAL HIGH (ref 4.0–10.5)
nRBC: 2.6 % — ABNORMAL HIGH (ref 0.0–0.2)

## 2020-05-24 LAB — COMPREHENSIVE METABOLIC PANEL
ALT: 42 U/L (ref 0–44)
AST: 65 U/L — ABNORMAL HIGH (ref 15–41)
Albumin: 2.7 g/dL — ABNORMAL LOW (ref 3.5–5.0)
Alkaline Phosphatase: 283 U/L — ABNORMAL HIGH (ref 38–126)
Anion gap: 9 (ref 5–15)
BUN: 46 mg/dL — ABNORMAL HIGH (ref 6–20)
CO2: 14 mmol/L — ABNORMAL LOW (ref 22–32)
Calcium: 8.5 mg/dL — ABNORMAL LOW (ref 8.9–10.3)
Chloride: 108 mmol/L (ref 98–111)
Creatinine, Ser: 3.65 mg/dL — ABNORMAL HIGH (ref 0.61–1.24)
GFR, Estimated: 21 mL/min — ABNORMAL LOW (ref >60)
Glucose, Bld: 101 mg/dL — ABNORMAL HIGH (ref 70–99)
Potassium: 5.8 mmol/L — ABNORMAL HIGH (ref 3.5–5.1)
Sodium: 131 mmol/L — ABNORMAL LOW (ref 135–145)
Total Bilirubin: 2.2 mg/dL — ABNORMAL HIGH (ref 0.3–1.2)
Total Protein: 6.6 g/dL (ref 6.5–8.1)

## 2020-05-24 LAB — PREPARE RBC (CROSSMATCH)

## 2020-05-24 LAB — LACTATE DEHYDROGENASE: LDH: 504 U/L — ABNORMAL HIGH (ref 98–192)

## 2020-05-24 MED ORDER — DIPHENHYDRAMINE HCL 50 MG/ML IJ SOLN
25.0000 mg | Freq: Once | INTRAMUSCULAR | Status: AC
  Administered 2020-05-24: 25 mg via INTRAVENOUS
  Filled 2020-05-24: qty 1

## 2020-05-24 MED ORDER — SODIUM ZIRCONIUM CYCLOSILICATE 10 G PO PACK
10.0000 g | PACK | Freq: Once | ORAL | Status: AC
  Administered 2020-05-24: 10 g via ORAL
  Filled 2020-05-24: qty 1

## 2020-05-24 MED ORDER — SODIUM CHLORIDE 0.9% IV SOLUTION: Freq: Once | INTRAVENOUS | Status: AC

## 2020-05-24 MED ORDER — ACETAMINOPHEN 325 MG PO TABS
650.0000 mg | ORAL_TABLET | Freq: Once | ORAL | Status: AC
  Administered 2020-05-24: 650 mg via ORAL
  Filled 2020-05-24: qty 2

## 2020-05-24 NOTE — Progress Notes (Signed)
MD, patient reports a "hole in his tooth". He forgot to mention this to rounding physician. This has been causing him quite a bit of pain.Roderick Pee

## 2020-05-24 NOTE — Progress Notes (Addendum)
Subjective: Patient continues to complain pain primarily to low back and lower extremities.  He rates pain as 8/10.  Today, patient's hemoglobin has decreased to 6.4, which is below his baseline.  He denies any headache, dizziness, shortness of breath, urinary symptoms, nausea, vomiting, or diarrhea. Objective:  Vital signs in last 24 hours:  Vitals:   05/24/20 0419 05/24/20 0423 05/24/20 0810 05/24/20 0929  BP:  (!) 135/93  122/88  Pulse:  (!) 114  (!) 104  Resp: '14 18 16 14  '$ Temp:  98.6 F (37 C)  98.3 F (36.8 C)  TempSrc:  Oral  Oral  SpO2: 92% 93% 93% 95%  Weight:      Height:        Intake/Output from previous day:   Intake/Output Summary (Last 24 hours) at 05/24/2020 1004 Last data filed at 05/24/2020 0900 Gross per 24 hour  Intake 343.5 ml  Output 1700 ml  Net -1356.5 ml    Physical Exam: General: Alert, awake, oriented x3, in no acute distress.  HEENT: [place]/AT PEERL, EOMI Neck: Trachea midline,  no masses, no thyromegal,y no JVD, no carotid bruit OROPHARYNX:  Moist, No exudate/ erythema/lesions.  Heart: Regular rate and rhythm, without murmurs, rubs, gallops, PMI non-displaced, no heaves or thrills on palpation.  Lungs: Clear to auscultation, no wheezing or rhonchi noted. No increased vocal fremitus resonant to percussion  Abdomen: Soft, nontender, nondistended, positive bowel sounds, no masses no hepatosplenomegaly noted..  Neuro: No focal neurological deficits noted cranial nerves II through XII grossly intact. DTRs 2+ bilaterally upper and lower extremities. Strength 5 out of 5 in bilateral upper and lower extremities. Musculoskeletal: No warm swelling or erythema around joints, no spinal tenderness noted. Psychiatric: Patient alert and oriented x3, good insight and cognition, good recent to remote recall. Lymph node survey: No cervical axillary or inguinal lymphadenopathy noted.  Lab Results:  Basic Metabolic Panel:    Component Value Date/Time   NA 131 (L)  05/24/2020 0844   NA 136 05/10/2020 1342   K 5.8 (H) 05/24/2020 0844   CL 108 05/24/2020 0844   CO2 14 (L) 05/24/2020 0844   BUN 46 (H) 05/24/2020 0844   BUN 23 (H) 05/10/2020 1342   CREATININE 3.65 (H) 05/24/2020 0844   CREATININE 1.45 (H) 05/12/2014 1430   GLUCOSE 101 (H) 05/24/2020 0844   CALCIUM 8.5 (L) 05/24/2020 0844   CBC:    Component Value Date/Time   WBC 18.1 (H) 05/24/2020 0844   HGB 6.4 (LL) 05/24/2020 0844   HGB 8.5 (L) 05/10/2020 1342   HCT 18.2 (L) 05/24/2020 0844   HCT 22.9 (L) 05/10/2020 1342   PLT 141 (L) 05/24/2020 0844   PLT 364 05/10/2020 1342   MCV 111.0 (H) 05/24/2020 0844   MCV 105 (H) 05/10/2020 1342   NEUTROABS 7.5 05/21/2020 0717   NEUTROABS 8.0 (H) 04/27/2020 1125   LYMPHSABS 8.5 (H) 05/21/2020 0717   LYMPHSABS 6.6 (H) 04/27/2020 1125   MONOABS 1.3 (H) 05/21/2020 0717   EOSABS 0.5 05/21/2020 0717   EOSABS 0.2 04/27/2020 1125   BASOSABS 0.0 05/21/2020 0717   BASOSABS 0.1 04/27/2020 1125    Recent Results (from the past 240 hour(s))  SARS CORONAVIRUS 2 (TAT 6-24 HRS) Nasopharyngeal Nasopharyngeal Swab     Status: None   Collection Time: 05/21/20 12:08 PM   Specimen: Nasopharyngeal Swab  Result Value Ref Range Status   SARS Coronavirus 2 NEGATIVE NEGATIVE Final    Comment: (NOTE) SARS-CoV-2 target nucleic acids are NOT DETECTED.  The SARS-CoV-2 RNA is generally detectable in upper and lower respiratory specimens during the acute phase of infection. Negative results do not preclude SARS-CoV-2 infection, do not rule out co-infections with other pathogens, and should not be used as the sole basis for treatment or other patient management decisions. Negative results must be combined with clinical observations, patient history, and epidemiological information. The expected result is Negative.  Fact Sheet for Patients: SugarRoll.be  Fact Sheet for Healthcare  Providers: https://www.woods-mathews.com/  This test is not yet approved or cleared by the [place] FDA and  has been authorized for detection and/or diagnosis of SARS-CoV-2 by FDA under an Emergency Use Authorization (EUA). This EUA will remain  in effect (meaning this test can be used) for the duration of the COVID-19 declaration under Se ction 564(b)(1) of the Act, 21 U.S.C. section 360bbb-3(b)(1), unless the authorization is terminated or revoked sooner.  Performed at [facility], [place] [address] [place], [place] [address]   Urine culture     Status: None   Collection Time: 05/21/20  5:30 PM   Specimen: Urine, Clean Catch  Result Value Ref Range Status   Specimen Description   Final    URINE, CLEAN CATCH Performed at [facility], [place] [address] [place], [place] [address]    Special Requests   Final    NONE Performed at [facility], [place] [address], [place], [place] [address]    Culture   Final    NO GROWTH Performed at [facility], [place] [address], [place], [place] [address]    Report Status 05/23/2020 FINAL  Final    Studies/Results: No results found.  Medications: Scheduled Meds: . sodium chloride   Intravenous Once  . acetaminophen  650 mg Oral Once  . allopurinol  100 mg Oral Daily  . cholecalciferol  2,000 Units Oral Daily  . diphenhydrAMINE  25 mg Intravenous Once  . enoxaparin (LOVENOX) injection  30 mg Subcutaneous Q24H  . folic acid  1 mg Oral Daily  . HYDROmorphone   Intravenous Q4H  . nortriptyline  25 mg Oral BID  . oxyCODONE  15 mg Oral Q12H  . senna-docusate  1 tablet Oral BID  . sodium bicarbonate  650 mg Oral BID  . sodium zirconium cyclosilicate  10 g Oral QODAY  . voxelotor  500 mg Oral Daily   Continuous Infusions: PRN Meds:.naloxone **AND** sodium chloride flush, ondansetron (ZOFRAN) IV, oxyCODONE, polyethylene  glycol  Consultants:  None  Procedures:  None  Antibiotics:  None  Assessment/Plan: Active Problems:   CKD (chronic kidney disease), stage IV (HCC)   Sickle cell pain crisis (HCC)   Leukocytosis   Chronic pain syndrome   Kidney insufficiency   History of cocaine use  Sickle cell pain crisis: Patient says that pain is worse on today.  No changes to IV Dilaudid PCA Encourage patient to request oxycodone 10 mg every 6 hours for severe breakthrough pain Hold Toradol due to chronic kidney disease Monitor vital signs very closely, reevaluate pain scale regularly, and supplemental oxygen as needed.  Acute superimposed on chronic kidney disease stage IV: Renal function continues to be worse than baseline.  Creatinine is 3.65 today.  However, stable today.  Continue to follow closely.  He is followed by nephrology as an outpatient.  Hyperkalemia: Potassium 5.8 today.  Patient is on Lokelma every other day.  Consider chronic due to stage IV CKD  History of anemia of chronic disease: Hemoglobin  is decreased to 6.4 today.  Transfuse 1 unit PRBCs.  Monitor closely.  Repeat CBC in AM.  History of polysubstance abuse: Reviewed urine drug screen, positive for THC and opiates.  Counseled on admission.  Leukocytosis: Stable.  Secondary to vaso-occlusive pain crisis.  Monitor closely without antibiotics.   Code Status: Full Code Family Communication: N/A Disposition Plan: Not yet ready for discharge  [place], MSN, FNP-C Patient [place] [address] [place], [place] [address] [phone]   If 5PM-8AM, please contact night-coverage.  05/24/2020, 10:04 AM  LOS: 3 days

## 2020-05-24 NOTE — Progress Notes (Signed)
Date and time results received: 05/24/20 0915 (use smartphrase ".now" to insert current time)  Test:  Critical Value: HGB 6.4  Name of Provider Notified: HOLLIS ,LACHINA.  NP  Orders Received? Or Actions Taken?: YES

## 2020-05-24 NOTE — Progress Notes (Signed)
   05/23/20 2224  Assess: MEWS Score  Temp 98.9 F (37.2 C)  BP (!) 115/94  Pulse Rate (!) 124  Resp 14  SpO2 (!) 89 %  O2 Device Room Air  Assess: MEWS Score  MEWS Temp 0  MEWS Systolic 0  MEWS Pulse 2  MEWS RR 0  MEWS LOC 0  MEWS Score 2  MEWS Score Color Yellow  Assess: if the MEWS score is Yellow or Red  Were vital signs taken at a resting state? Yes  Focused Assessment No change from prior assessment (patient's heartrate often fluctuates, MD still notified.)  Early Detection of Sepsis Score *See Row Information* Low  MEWS guidelines implemented *See Row Information* Yes  Treat  MEWS Interventions Administered scheduled meds/treatments  Pain Score 9  Take Vital Signs  Increase Vital Sign Frequency  Yellow: Q 2hr X 2 then Q 4hr X 2, if remains yellow, continue Q 4hrs  Escalate  MEWS: Escalate Yellow: discuss with charge nurse/RN and consider discussing with provider and RRT  Notify: Charge Nurse/RN  Name of Charge Nurse/RN Notified  (Discussed w/ 2nd [place].)  Date Charge Nurse/RN Notified 05/23/20  Time Charge Nurse/RN Notified 2358  Notify: Provider  Provider Name/Title [facility]  Date Provider Notified 05/23/20  Time Provider Notified 2350 (RN did not know until about an hour later; NT didnt notify.)  Notification Type Page  Notification Reason Other (Comment) (yellow MEWS)  Provider response No new orders  Date of Provider Response  (n/a)  Time of Provider Response  (n/a)  Notify: Rapid Response  Name of Rapid Response RN Notified n/a  Document  Patient Outcome Stabilized after interventions  Progress note created (see row info) Yes

## 2020-05-24 NOTE — Care Management Important Message (Signed)
Important Message  Patient Details IM Letter given to the Patient. Name: [name] [name]. MRN: [id number] Date of Birth: 1980-12-09   Medicare Important Message Given:  Yes     Kerin Salen 05/24/2020, 10:47 AM

## 2020-05-25 DIAGNOSIS — D5700 Hb-SS disease with crisis, unspecified: Secondary | ICD-10-CM | POA: Diagnosis not present

## 2020-05-25 LAB — CBC
HCT: 22.2 % — ABNORMAL LOW (ref 39.0–52.0)
Hemoglobin: 7.9 g/dL — ABNORMAL LOW (ref 13.0–17.0)
MCH: 36.9 pg — ABNORMAL HIGH (ref 26.0–34.0)
MCHC: 35.6 g/dL (ref 30.0–36.0)
MCV: 103.7 fL — ABNORMAL HIGH (ref 80.0–100.0)
Platelets: 203 10*3/uL (ref 150–400)
RBC: 2.14 MIL/uL — ABNORMAL LOW (ref 4.22–5.81)
RDW: 26.7 % — ABNORMAL HIGH (ref 11.5–15.5)
WBC: 16.1 10*3/uL — ABNORMAL HIGH (ref 4.0–10.5)
nRBC: 2.8 % — ABNORMAL HIGH (ref 0.0–0.2)

## 2020-05-25 LAB — BASIC METABOLIC PANEL
Anion gap: 10 (ref 5–15)
BUN: 43 mg/dL — ABNORMAL HIGH (ref 6–20)
CO2: 13 mmol/L — ABNORMAL LOW (ref 22–32)
Calcium: 8.7 mg/dL — ABNORMAL LOW (ref 8.9–10.3)
Chloride: 110 mmol/L (ref 98–111)
Creatinine, Ser: 3.35 mg/dL — ABNORMAL HIGH (ref 0.61–1.24)
GFR, Estimated: 23 mL/min — ABNORMAL LOW (ref >60)
Glucose, Bld: 102 mg/dL — ABNORMAL HIGH (ref 70–99)
Potassium: 4.4 mmol/L (ref 3.5–5.1)
Sodium: 133 mmol/L — ABNORMAL LOW (ref 135–145)

## 2020-05-25 LAB — BPAM RBC
Blood Product Expiration Date: 202204142359
ISSUE DATE / TIME: 202204111504
Unit Type and Rh: 5100

## 2020-05-25 LAB — TYPE AND SCREEN
ABO/RH(D): O POS
Antibody Screen: NEGATIVE
Donor AG Type: NEGATIVE
Unit division: 0

## 2020-05-25 MED ORDER — HYDROMORPHONE 1 MG/ML IV SOLN
INTRAVENOUS | Status: DC
Start: 2020-05-25 — End: 2020-05-26
  Administered 2020-05-25 – 2020-05-26 (×3): 1 mg via INTRAVENOUS

## 2020-05-25 NOTE — Progress Notes (Signed)
Patient called RN into room because PIV in left wrist was "burning" and painful. Site with minimal swelling and burning when flushed. Patient now has no IV access for PCA. Earlier on this shift, patient mentioned that he would like to go home tomorrow 4/12. Seeing as how patient has minimal vascular access, based off the evidence of multiple PIVs on this admission. This RN asked the patient if he thinks we would be able to manage his current pain on pill form pain medication, since we may run into a road block with finding a new IV site. Patient stated, "that wasn't the plan", saying that MD said he could stay on pain pump until his pain is controlled, and patient explained it's not better and that he's currently having pain in his lower back. RN educated patient a second time as to how difficult this situation is and patient stated "well y'all need to figure something out". IV team consult entered.

## 2020-05-25 NOTE — Progress Notes (Signed)
Subjective: Today, patient is complaining of low back and lower extremity pain.  He rates pain as 8/10 characterized as constant and aching.  Patient says that he is unable to find a position of comfort. Today, patient's hemoglobin has improved to 7.9.  He is s/p 1 unit of PRBCs on 05/24/2020. He denies any headache, dizziness, shortness of breath, urinary symptoms, nausea, vomiting, or diarrhea.  Objective:  Vital signs in last 24 hours:  Vitals:   05/25/20 0319 05/25/20 0556 05/25/20 0807 05/25/20 0938  BP:  124/84  119/79  Pulse:  100  98  Resp: '14 17 16 16  '$ Temp:  98.4 F (36.9 C)  98.5 F (36.9 C)  TempSrc:  Oral  Oral  SpO2: 96% 97% 92% 90%  Weight:      Height:        Intake/Output from previous day:   Intake/Output Summary (Last 24 hours) at 05/25/2020 1314 Last data filed at 05/25/2020 0204 Gross per 24 hour  Intake 1051.67 ml  Output 2175 ml  Net -1123.33 ml    Physical Exam: General: Alert, awake, oriented x3, in no acute distress.  HEENT: [place]/AT PEERL, EOMI Neck: Trachea midline,  no masses, no thyromegal,y no JVD, no carotid bruit OROPHARYNX:  Moist, No exudate/ erythema/lesions.  Heart: Regular rate and rhythm, Murmur present , rubs, gallops, PMI non-displaced, no heaves or thrills on palpation.  Lungs: Clear to auscultation, no wheezing or rhonchi noted. No increased vocal fremitus resonant to percussion  Abdomen: Soft, nontender, nondistended, positive bowel sounds, no masses no hepatosplenomegaly noted..  Neuro: No focal neurological deficits noted cranial nerves II through XII grossly intact. DTRs 2+ bilaterally upper and lower extremities. Strength 5 out of 5 in bilateral upper and lower extremities. Musculoskeletal: No warm swelling or erythema around joints, no spinal tenderness noted. Psychiatric: Patient alert and oriented x3, good insight and cognition, good recent to remote recall. Lymph node survey: No cervical axillary or inguinal lymphadenopathy  noted.  Lab Results:  Basic Metabolic Panel:    Component Value Date/Time   NA 133 (L) 05/25/2020 0542   NA 136 05/10/2020 1342   K 4.4 05/25/2020 0542   CL 110 05/25/2020 0542   CO2 13 (L) 05/25/2020 0542   BUN 43 (H) 05/25/2020 0542   BUN 23 (H) 05/10/2020 1342   CREATININE 3.35 (H) 05/25/2020 0542   CREATININE 1.45 (H) 05/12/2014 1430   GLUCOSE 102 (H) 05/25/2020 0542   CALCIUM 8.7 (L) 05/25/2020 0542   CBC:    Component Value Date/Time   WBC 16.1 (H) 05/25/2020 0542   HGB 7.9 (L) 05/25/2020 0542   HGB 8.5 (L) 05/10/2020 1342   HCT 22.2 (L) 05/25/2020 0542   HCT 22.9 (L) 05/10/2020 1342   PLT 203 05/25/2020 0542   PLT 364 05/10/2020 1342   MCV 103.7 (H) 05/25/2020 0542   MCV 105 (H) 05/10/2020 1342   NEUTROABS 7.5 05/21/2020 0717   NEUTROABS 8.0 (H) 04/27/2020 1125   LYMPHSABS 8.5 (H) 05/21/2020 0717   LYMPHSABS 6.6 (H) 04/27/2020 1125   MONOABS 1.3 (H) 05/21/2020 0717   EOSABS 0.5 05/21/2020 0717   EOSABS 0.2 04/27/2020 1125   BASOSABS 0.0 05/21/2020 0717   BASOSABS 0.1 04/27/2020 1125    Recent Results (from the past 240 hour(s))  SARS CORONAVIRUS 2 (TAT 6-24 HRS) Nasopharyngeal Nasopharyngeal Swab     Status: None   Collection Time: 05/21/20 12:08 PM   Specimen: Nasopharyngeal Swab  Result Value Ref Range Status   SARS  Coronavirus 2 NEGATIVE NEGATIVE Final    Comment: (NOTE) SARS-CoV-2 target nucleic acids are NOT DETECTED.  The SARS-CoV-2 RNA is generally detectable in upper and lower respiratory specimens during the acute phase of infection. Negative results do not preclude SARS-CoV-2 infection, do not rule out co-infections with other pathogens, and should not be used as the sole basis for treatment or other patient management decisions. Negative results must be combined with clinical observations, patient history, and epidemiological information. The expected result is Negative.  Fact Sheet for  Patients: SugarRoll.be  Fact Sheet for Healthcare Providers: https://www.woods-mathews.com/  This test is not yet approved or cleared by the [place] FDA and  has been authorized for detection and/or diagnosis of SARS-CoV-2 by FDA under an Emergency Use Authorization (EUA). This EUA will remain  in effect (meaning this test can be used) for the duration of the COVID-19 declaration under Se ction 564(b)(1) of the Act, 21 U.S.C. section 360bbb-3(b)(1), unless the authorization is terminated or revoked sooner.  Performed at [facility], [place] [address] [place], [place] [address]   Urine culture     Status: None   Collection Time: 05/21/20  5:30 PM   Specimen: Urine, Clean Catch  Result Value Ref Range Status   Specimen Description   Final    URINE, CLEAN CATCH Performed at [facility], [place] [address] [place], [place] [address]    Special Requests   Final    NONE Performed at [facility], [place] [address] [place], [place] [address]    Culture   Final    NO GROWTH Performed at [facility], [place] [address], [place], [place] [address]    Report Status 05/23/2020 FINAL  Final    Studies/Results: No results found.  Medications: Scheduled Meds: . allopurinol  100 mg Oral Daily  . cholecalciferol  2,000 Units Oral Daily  . enoxaparin (LOVENOX) injection  30 mg Subcutaneous Q24H  . folic acid  1 mg Oral Daily  . HYDROmorphone   Intravenous Q4H  . nortriptyline  25 mg Oral BID  . oxyCODONE  15 mg Oral Q12H  . senna-docusate  1 tablet Oral BID  . sodium bicarbonate  650 mg Oral BID  . sodium zirconium cyclosilicate  10 g Oral QODAY  . voxelotor  500 mg Oral Daily   Continuous Infusions: PRN Meds:.naloxone **AND** sodium chloride flush, ondansetron (ZOFRAN) IV, oxyCODONE, polyethylene  glycol  Consultants:  None  Procedures:  None  Antibiotics:  None  Assessment/Plan: Principal Problem:   Sickle cell pain crisis (HCC) Active Problems:   CKD (chronic kidney disease), stage IV (HCC)   Leukocytosis   Chronic pain syndrome   Kidney insufficiency   History of cocaine use   Sickle cell pain crisis: Pain persists despite IV Dilaudid PCA.  Patient has not requested oxycodone overnight for greater pain control.  Patient encouraged to request oxycodone 10 mg every 4 hours as needed for severe breakthrough pain Continue OxyContin 15 mg every 12 hours Continue to hold Toradol due to history of chronic kidney disease stage IV Monitor vital signs very closely, reevaluate pain scale regularly, and supplemental oxygen as needed.  Acute superimposed on chronic kidney disease stage IV: Improving.  Stable.  However, renal function continues to be worse than his baseline.  Continue to follow closely.  He is followed closely by nephrology as an outpatient.  Hyperkalemia: Improved.  Patient is on Lokelma every other day.  Considered to be chronic  due to stage IV CKD  History of anemia of chronic disease: Hemoglobin improved to 7.9 today.  Does not warrant any further transfusions at this time.  Continue to monitor closely.  CBC in AM.  History of polysubstance abuse: Reviewed UDS, positive for THC and opiates.  Counseled on admission.  Leukocytosis: Improving.  Secondary to vaso-occlusive pain crisis.  Monitor closely without antibiotics.  CBC in AM. Code Status: Full Code Family Communication: N/A Disposition Plan: Not yet ready for discharge.  Possible discharge on 05/26/2020  [name]  APRN, MSN, FNP-C Patient [place] [place], [place] [address] [phone]  If 5PM-8AM, please contact night-coverage.  05/25/2020, 1:14 PM  LOS: 4 days

## 2020-05-25 NOTE — Progress Notes (Signed)
IV team was successful with PIV placement and PCA was restarted at this time.

## 2020-05-25 NOTE — Progress Notes (Signed)
Chaplain engaged in an initial visit with [name] [name].  Chaplain checked in and offered support.  [name] [name] stated he would be discharging tomorrow.   Chaplain is available to follow-up.    05/25/20 1600  Clinical Encounter Type  Visited With Patient  Visit Type Initial

## 2020-05-26 ENCOUNTER — Other Ambulatory Visit (HOSPITAL_COMMUNITY): Payer: Self-pay

## 2020-05-26 ENCOUNTER — Telehealth: Payer: Self-pay

## 2020-05-26 DIAGNOSIS — D5700 Hb-SS disease with crisis, unspecified: Secondary | ICD-10-CM | POA: Diagnosis not present

## 2020-05-26 LAB — BASIC METABOLIC PANEL
Anion gap: 8 (ref 5–15)
BUN: 44 mg/dL — ABNORMAL HIGH (ref 6–20)
CO2: 14 mmol/L — ABNORMAL LOW (ref 22–32)
Calcium: 8.6 mg/dL — ABNORMAL LOW (ref 8.9–10.3)
Chloride: 110 mmol/L (ref 98–111)
Creatinine, Ser: 3.06 mg/dL — ABNORMAL HIGH (ref 0.61–1.24)
GFR, Estimated: 26 mL/min — ABNORMAL LOW (ref >60)
Glucose, Bld: 100 mg/dL — ABNORMAL HIGH (ref 70–99)
Potassium: 4.5 mmol/L (ref 3.5–5.1)
Sodium: 132 mmol/L — ABNORMAL LOW (ref 135–145)

## 2020-05-26 LAB — CBC
HCT: 22.2 % — ABNORMAL LOW (ref 39.0–52.0)
Hemoglobin: 7.9 g/dL — ABNORMAL LOW (ref 13.0–17.0)
MCH: 37.3 pg — ABNORMAL HIGH (ref 26.0–34.0)
MCHC: 35.6 g/dL (ref 30.0–36.0)
MCV: 104.7 fL — ABNORMAL HIGH (ref 80.0–100.0)
Platelets: 192 10*3/uL (ref 150–400)
RBC: 2.12 MIL/uL — ABNORMAL LOW (ref 4.22–5.81)
RDW: 27.7 % — ABNORMAL HIGH (ref 11.5–15.5)
WBC: 14.8 10*3/uL — ABNORMAL HIGH (ref 4.0–10.5)
nRBC: 2.2 % — ABNORMAL HIGH (ref 0.0–0.2)

## 2020-05-26 MED ORDER — OXYCODONE HCL ER 15 MG PO T12A
15.0000 mg | EXTENDED_RELEASE_TABLET | Freq: Two times a day (BID) | ORAL | 0 refills | Status: DC
  Filled 2020-05-26 – 2020-05-27 (×4): qty 60, 30d supply, fill #0

## 2020-05-26 MED ORDER — OXYCODONE HCL 10 MG PO TABS
10.0000 mg | ORAL_TABLET | Freq: Four times a day (QID) | ORAL | 0 refills | Status: DC | PRN
  Filled 2020-05-26 – 2020-05-27 (×4): qty 60, 15d supply, fill #0

## 2020-05-26 NOTE — Progress Notes (Signed)
Went to patient's room to document PCA, realized that the SPO2 machine was off. I turned it back on, have him put his oxygen on and explained to him the importance of having it on. Patient stated in a very insulting manner that he does not want to hear the sound and he will throw it out if it keeps beeping. I explained to him that the only way we can turn the machine off is if I get an order from the doctor to d/c the PCA pump. He kept on throwing insults, so I politely excuse myself and notify the charge nurse. Lolita Lenz

## 2020-05-26 NOTE — Telephone Encounter (Signed)
Inactive [facility].  Call back of member ID.  Patient informed.

## 2020-05-26 NOTE — Discharge Summary (Signed)
Physician Discharge Summary  [name] [name]. ID:[id number] DOB: Mar 26, 1980 DOA: 05/21/2020  PCP: [name], FNP  Admit date: 05/21/2020  Discharge date: 05/26/2020  Discharge Diagnoses:  Principal Problem:   Sickle cell pain crisis ([place]) Active Problems:   CKD (chronic kidney disease), stage IV (HCC)   Leukocytosis   Chronic pain syndrome   Kidney insufficiency   History of cocaine use   Discharge Condition: Stable  Disposition:   Follow-up Information    Call  [name], FNP.   Specialty: Family Medicine Why: call for future prescriptions for your medicines Contact information: [address] [address] [phone]              Pt is discharged home in good condition and is to follow up with [name], FNP this week to have labs evaluated. [name] A Delisa Jr. is instructed to increase activity slowly and balance with rest for the next few days, and use prescribed medication to complete treatment of pain  Diet: Regular Wt Readings from Last 3 Encounters:  05/24/20 52.6 kg  04/20/20 54 kg  04/13/20 54.1 kg    History of present illness:  [name] [name]  is a 40 y.o. male with a medical history significant for sickle cell disease, chronic pain syndrome, opiate dependence and tolerance, history of polysubstance abuse, history of anemia of chronic disease, and history of CKD stage IV presented to the emergency department with ongoing pain to low back and lower extremities that has been present over the past several days.  He also says that he has had increased pain on and off for 3 weeks due to the fact that he does not have home opiate medications.  Patient failed a recent drug test and has been unable to obtain prescriptions for his chronic pain medications.  He also attributes increased pain to changes in weather.  He characterizes his pain as constant and throbbing.  He denies any fever, chills, chest pain, or  shortness of breath.  No fatigue, urinary symptoms, nausea, vomiting, or diarrhea.  He denies any recent travel, sick contacts, or exposure to COVID-19.  Patient denies any current illicit drug use. Of note, patient is very belligerently discussed history and pain medications.    Emergency room course: Vital signs recorded as:BP 127/90 (BP Location: Right Arm)   Pulse 98   Temp 98 F (36.7 C) (Oral)   Resp 18   Ht '5\' 3"'$  (1.6 m)   Wt 54 kg   SpO2 98%   BMI 21.09 kg/m .  Complete blood count shows WBCs 18.3, hemoglobin 7.4, and platelets 176.  Comprehensive metabolic panel shows potassium 5.9, chloride 113, CO2 17, creatinine 3.87.  AST 74, ALT 43, alkaline phos 259, and total bilirubin 0.9.  Absolute reticulocyte is 161.3.  COVID-19 test pending.  Patient's pain persists despite IV Dilaudid and IV fluids.  Sickle cell team notified for admission.  Patient admitted to [facility] for further management of sickle cell pain crisis.  Hospital Course:  Sickle cell disease with pain crisis: Patient was admitted for sickle cell pain crisis and managed appropriately with IVF, IV Dilaudid via PCA and IV as well as other adjunct therapies per sickle cell pain management protocols.  IV Dilaudid PCA was weaned appropriately.  Patient transition to home medication.  OxyContin 15 mg every 12 hours was continued throughout admission. Over the past month, patient has been unable to get home medications from PCP due to positive  drug screen.  On admission, drug screen was negative for cocaine and positive for opiates and THC. Reviewed PDMP, no inconsistencies noted.  Will resume home medications and follow patient in primary care.  OxyContin 15 mg every 12 hours #60 and oxycodone 10 mg every 6 hours #60 was sent to patient's pharmacy. Patient counseled at length on refraining from illicit substances in order to continue opiate medications for pain related to sickle cell disease.  Acute kidney injury superimposed on  chronic kidney disease stage IV: Creatinine has remained above patient's baseline, however improved throughout admission.  Prior to discharge, creatinine 3.03.  Patient is followed by outpatient nephrology.  Patient has history of hyperkalemia related to CKD stage IV.  Lokelma is continued every other day per nephrology.  Patient advised to follow-up as scheduled.  Anemia of chronic disease: Patient's baseline hemoglobin is 7-8 g/dL.  Hemoglobin decreased to 6.4 and patient was transfused 1 unit PRBCs.  Prior to discharge, hemoglobin is 7.9.  Patient is followed by [facility] hematology.  He was started on Oxbryta several weeks ago and has been taking this medication consistently.  Patient is no longer on Aranesp injections.  He has also continued on hydroxyurea.  Patient advised to follow-up with [facility] hematology as scheduled.  Patient is aware of all upcoming appointments.  He will follow-up with PCP in 1 week to repeat CBC and CMP.  He is alert, oriented, and ambulating without assistance.  Patient will discharge home in a hemodynamically stable condition. Patient was therefore discharged home today in a hemodynamically stable condition.   [name] [name] will follow-up with PCP within 1 week of this discharge. [name] [name] was counseled extensively about nonpharmacologic means of pain management, patient verbalized understanding and was appreciative of  the care received during this admission.   We discussed the need for good hydration, monitoring of hydration status, avoidance of heat, cold, stress, and infection triggers. We discussed the need to be adherent with taking Hydrea and other home medications. Patient was reminded of the need to seek medical attention immediately if any symptom of bleeding, anemia, or infection occurs.  Discharge Exam: Vitals:   05/26/20 0800 05/26/20 0850  BP:  115/74  Pulse:  (!) 103  Resp: 12 16  Temp:  98.5 F (36.9 C)  SpO2: 100% 99%   Vitals:   05/26/20 0356 05/26/20 0400  05/26/20 0800 05/26/20 0850  BP:  114/79  115/74  Pulse:  (!) 107  (!) 103  Resp: '16 14 12 16  '$ Temp:  98.2 F (36.8 C)  98.5 F (36.9 C)  TempSrc:  Oral  Oral  SpO2: 100% 99% 100% 99%  Weight:      Height:        General appearance : Awake, alert, not in any distress. Speech Clear. Not toxic looking HEENT: Atraumatic and Normocephalic, pupils equally reactive to light and accomodation Neck: Supple, no JVD. No cervical lymphadenopathy.  Chest: Good air entry bilaterally, no added sounds  CVS: S1 S2 regular, no murmurs.  Abdomen: Bowel sounds present, Non tender and not distended with no gaurding, rigidity or rebound. Extremities: B/L Lower Ext shows no edema, both legs are warm to touch Neurology: Awake alert, and oriented X 3, CN II-XII intact, Non focal Skin: No Rash  Discharge Instructions  Discharge Instructions    Discharge patient   Complete by: As directed    Discharge disposition: 01-Home or Self Care   Discharge patient date: 05/26/2020     Allergies as of 05/26/2020  Reactions   Nsaids Other (See Comments)   Kidney disease   Morphine And Related Other (See Comments)   "Real bad headaches"       Medication List    TAKE these medications   acetaminophen 650 MG CR tablet Commonly known as: TYLENOL Take 650 mg by mouth every 8 (eight) hours as needed for pain.   acetaminophen 500 MG tablet Commonly known as: TYLENOL Take 1 tablet (500 mg total) by mouth every 6 (six) hours as needed.   allopurinol 100 MG tablet Commonly known as: ZYLOPRIM Take 100 mg by mouth daily.   folic acid 1 MG tablet Commonly known as: FOLVITE TAKE 1 TABLET BY MOUTH EVERY DAY   hydroxyurea 500 MG capsule Commonly known as: HYDREA Take 500 mg by mouth daily.   Lokelma 10 g Pack packet Generic drug: sodium zirconium cyclosilicate Take 10 g by mouth every other day.   nortriptyline 25 MG capsule Commonly known as: PAMELOR TAKE 1 CAPSULE BY MOUTH 2 TIMES DAILY.    Oxbryta 500 MG Tabs tablet Generic drug: voxelotor Take 500 mg by mouth daily.   oxyCODONE 15 mg 12 hr tablet Commonly known as: OxyCONTIN Take 1 tablet (15 mg total) by mouth every 12 (twelve) hours.   Oxycodone HCl 10 MG Tabs Take 1 tablet (10 mg total) by mouth every 6 (six) hours as needed (pain).   sodium bicarbonate 650 MG tablet Take 1,950 mg by mouth 3 (three) times daily.   Vitamin D3 50 MCG (2000 UT) Tabs Take 1 each by mouth daily. What changed: how much to take       The results of significant diagnostics from this hospitalization (including imaging, microbiology, ancillary and laboratory) are listed below for reference.    Significant Diagnostic Studies: No results found.  Microbiology: Recent Results (from the past 240 hour(s))  SARS CORONAVIRUS 2 (TAT 6-24 HRS) Nasopharyngeal Nasopharyngeal Swab     Status: None   Collection Time: 05/21/20 12:08 PM   Specimen: Nasopharyngeal Swab  Result Value Ref Range Status   SARS Coronavirus 2 NEGATIVE NEGATIVE Final    Comment: (NOTE) SARS-CoV-2 target nucleic acids are NOT DETECTED.  The SARS-CoV-2 RNA is generally detectable in upper and lower respiratory specimens during the acute phase of infection. Negative results do not preclude SARS-CoV-2 infection, do not rule out co-infections with other pathogens, and should not be used as the sole basis for treatment or other patient management decisions. Negative results must be combined with clinical observations, patient history, and epidemiological information. The expected result is Negative.  Fact Sheet for Patients: SugarRoll.be  Fact Sheet for Healthcare Providers: https://www.woods-mathews.com/  This test is not yet approved or cleared by the [place] FDA and  has been authorized for detection and/or diagnosis of SARS-CoV-2 by FDA under an Emergency Use Authorization (EUA). This EUA will remain  in effect  (meaning this test can be used) for the duration of the COVID-19 declaration under Se ction 564(b)(1) of the Act, 21 U.S.C. section 360bbb-3(b)(1), unless the authorization is terminated or revoked sooner.  Performed at [facility], [place] [address], [place], [place] [address]   Urine culture     Status: None   Collection Time: 05/21/20  5:30 PM   Specimen: Urine, Clean Catch  Result Value Ref Range Status   Specimen Description   Final    URINE, CLEAN CATCH Performed at [facility], [place] [address] [place], [place] [address]    Special Requests  Final    NONE Performed at [facility], [place] [address], [place], [place] [address]    Culture   Final    NO GROWTH Performed at [facility], [place] [address] [place], [place] [address]    Report Status 05/23/2020 FINAL  Final     Labs: Basic Metabolic Panel: Recent Labs  Lab 05/21/20 0717 05/24/20 0844 05/25/20 0542 05/26/20 0549  NA 139 131* 133* 132*  K 5.9* 5.8* 4.4 4.5  CL 113* 108 110 110  CO2 17* 14* 13* 14*  GLUCOSE 97 101* 102* 100*  BUN 46* 46* 43* 44*  CREATININE 3.87* 3.65* 3.35* 3.06*  CALCIUM 7.8* 8.5* 8.7* 8.6*   Liver Function Tests: Recent Labs  Lab 05/21/20 0717 05/24/20 0844  AST 74* 65*  ALT 43 42  ALKPHOS 259* 283*  BILITOT 0.9 2.2*  PROT 6.2* 6.6  ALBUMIN 2.6* 2.7*   No results for input(s): LIPASE, AMYLASE in the last 168 hours. No results for input(s): AMMONIA in the last 168 hours. CBC: Recent Labs  Lab 05/21/20 0717 05/23/20 0652 05/24/20 0844 05/25/20 0542 05/26/20 0549  WBC 18.3* 19.1* 18.1* 16.1* 14.8*  NEUTROABS 7.5  --   --   --   --   HGB 7.4* 7.0* 6.4* 7.9* 7.9*  HCT 21.2* 19.7* 18.2* 22.2* 22.2*  MCV 111.6* 110.1* 111.0* 103.7* 104.7*  PLT 176 134* 141* 203 192   Cardiac Enzymes: No results for input(s): CKTOTAL, CKMB, CKMBINDEX, TROPONINI in the last 168 hours. BNP: Invalid input(s): POCBNP CBG: No results for  input(s): GLUCAP in the last 168 hours.  Time coordinating discharge: 50 minutes  Signed:  [name]  APRN, MSN, FNP-C [facility] [place] [address] [place], [place] [address] [phone]  [facility] 05/26/2020, 10:16 AM

## 2020-05-26 NOTE — Progress Notes (Signed)
Discussed with patient discharge instructions, they verbalized agreement and understanding.  Patient to leave in private vehicle with all belongings.

## 2020-05-26 NOTE — Progress Notes (Addendum)
   05/25/20 2000  Assess: MEWS Score  Temp 97.7 F (36.5 C)  BP 129/88  Pulse Rate (!) 115  Resp 11  Level of Consciousness Alert  SpO2 95 %  O2 Device Room Air  Assess: MEWS Score  MEWS Temp 0  MEWS Systolic 0  MEWS Pulse 2  MEWS RR 1  MEWS LOC 0  MEWS Score 3  MEWS Score Color Yellow  Please see next note

## 2020-05-26 NOTE — Progress Notes (Addendum)
   05/25/20 2125  Assess: MEWS Score  Pulse Rate (!) 107  Assess: MEWS Score  MEWS Temp 0  MEWS Systolic 0  MEWS Pulse 1  MEWS RR 1  MEWS LOC 0  MEWS Score 2  MEWS Score Color Yellow  Assess: if the MEWS score is Yellow or Red  Were vital signs taken at a resting state? Yes  Focused Assessment No change from prior assessment  Early Detection of Sepsis Score *See Row Information* Low  MEWS guidelines implemented *See Row Information* Yes  Treat  MEWS Interventions Administered scheduled meds/treatments;Other (Comment) (patient is having pain at this moment; encouraged PCA & gave scheduled Oxycontin)  Pain Scale 0-10  Pain Score 8  Pain Type Acute pain  Pain Location Generalized  Pain Orientation Right;Left;Lower;Upper  Pain Descriptors / Indicators Aching;Discomfort  Pain Frequency Constant  Pain Onset On-going  Patients Stated Pain Goal 5  Pain Intervention(s) Medication (See eMAR);PCA encouraged;Relaxation;Rest  Take Vital Signs  Increase Vital Sign Frequency  Yellow: Q 2hr X 2 then Q 4hr X 2, if remains yellow, continue Q 4hrs  Escalate  MEWS: Escalate Yellow: discuss with charge nurse/RN and consider discussing with provider and RRT  Notify: Charge Nurse/RN  Name of Charge Nurse/RN Notified Colletta [place]  Date Charge Nurse/RN Notified 05/25/20  Time Charge Nurse/RN Notified 2133  Document  Patient Outcome Other (Comment) (Patient stable; laying in bed watching TV. Patient has been a yellow MEWS in the past for tachycardia. Will continue to monitor patient.)  Heart rate improving has decreased from the original yellow MEWs vitals from 2000.

## 2020-05-27 ENCOUNTER — Telehealth (HOSPITAL_COMMUNITY): Payer: Self-pay | Admitting: *Deleted

## 2020-05-27 ENCOUNTER — Other Ambulatory Visit (HOSPITAL_COMMUNITY): Payer: Self-pay

## 2020-05-27 NOTE — Telephone Encounter (Signed)
Patient called requesting to come to the day hospital for sickle cell pain. Patient reports lower back pain rated 9/10. Patient was discharged from the hospital yesterday and reports that he is unable to get his prescribed pain medications due to insurance issues. COVID-19 screening done and patient denies all symptoms and exposures. Denies fever, chest pain, nausea, vomiting, diarrhea, abdominal pain and priapism. Admits to having transportation without driving self. [place], [place] notified and advised that she will follow-up on patient's home medications. Patient has a new insurance and his medications may need to be adjusted in order for insurance to approve. Patient advised to stay home and provider will address insurance issue so he can get his home medications. Patient advised and expresses an understanding.

## 2020-05-30 LAB — DRUG SCREEN 764883 11+OXYCO+ALC+CRT-BUND
Amphetamines, Urine: NEGATIVE ng/mL
BENZODIAZ UR QL: NEGATIVE ng/mL
Barbiturate: NEGATIVE ng/mL
Cocaine (Metabolite): NEGATIVE ng/mL
Creatinine: 60.5 mg/dL (ref 20.0–300.0)
Ethanol: NEGATIVE %
Meperidine: NEGATIVE ng/mL
Methadone Screen, Urine: NEGATIVE ng/mL
OPIATE SCREEN URINE: NEGATIVE ng/mL
Oxycodone/Oxymorphone, Urine: NEGATIVE ng/mL
Phencyclidine: NEGATIVE ng/mL
Propoxyphene: NEGATIVE ng/mL
Tramadol: NEGATIVE ng/mL
pH, Urine: 6.5 (ref 4.5–8.9)

## 2020-05-30 LAB — CANNABINOID CONFIRMATION, UR
CANNABINOIDS: POSITIVE — AB
Carboxy THC GC/MS Conf: 230 ng/mL

## 2020-06-01 ENCOUNTER — Other Ambulatory Visit (HOSPITAL_COMMUNITY): Payer: Self-pay

## 2020-06-03 ENCOUNTER — Encounter (HOSPITAL_COMMUNITY): Payer: Self-pay | Admitting: Internal Medicine

## 2020-06-03 ENCOUNTER — Telehealth (HOSPITAL_COMMUNITY): Payer: Self-pay | Admitting: *Deleted

## 2020-06-03 ENCOUNTER — Non-Acute Institutional Stay (HOSPITAL_COMMUNITY)
Admission: AD | Admit: 2020-06-03 | Discharge: 2020-06-03 | Disposition: A | Discharge status: Home / Self Care | Payer: Medicare Other | Source: Ambulatory Visit | Attending: Internal Medicine | Admitting: Internal Medicine

## 2020-06-03 DIAGNOSIS — Z79891 Long term (current) use of opiate analgesic: Secondary | ICD-10-CM | POA: Diagnosis not present

## 2020-06-03 DIAGNOSIS — K219 Gastro-esophageal reflux disease without esophagitis: Secondary | ICD-10-CM | POA: Diagnosis not present

## 2020-06-03 DIAGNOSIS — Z885 Allergy status to narcotic agent status: Secondary | ICD-10-CM | POA: Insufficient documentation

## 2020-06-03 DIAGNOSIS — G894 Chronic pain syndrome: Secondary | ICD-10-CM | POA: Insufficient documentation

## 2020-06-03 DIAGNOSIS — Z79899 Other long term (current) drug therapy: Secondary | ICD-10-CM | POA: Diagnosis not present

## 2020-06-03 DIAGNOSIS — F17210 Nicotine dependence, cigarettes, uncomplicated: Secondary | ICD-10-CM | POA: Insufficient documentation

## 2020-06-03 DIAGNOSIS — N183 Chronic kidney disease, stage 3 unspecified: Secondary | ICD-10-CM | POA: Diagnosis not present

## 2020-06-03 DIAGNOSIS — Z886 Allergy status to analgesic agent status: Secondary | ICD-10-CM | POA: Diagnosis not present

## 2020-06-03 DIAGNOSIS — D5700 Hb-SS disease with crisis, unspecified: Secondary | ICD-10-CM | POA: Diagnosis not present

## 2020-06-03 LAB — COMPREHENSIVE METABOLIC PANEL
ALT: 31 U/L (ref 0–44)
AST: 59 U/L — ABNORMAL HIGH (ref 15–41)
Albumin: 2.2 g/dL — ABNORMAL LOW (ref 3.5–5.0)
Alkaline Phosphatase: 232 U/L — ABNORMAL HIGH (ref 38–126)
Anion gap: 9 (ref 5–15)
BUN: 27 mg/dL — ABNORMAL HIGH (ref 6–20)
CO2: 22 mmol/L (ref 22–32)
Calcium: 7.4 mg/dL — ABNORMAL LOW (ref 8.9–10.3)
Chloride: 106 mmol/L (ref 98–111)
Creatinine, Ser: 2.54 mg/dL — ABNORMAL HIGH (ref 0.61–1.24)
GFR, Estimated: 32 mL/min — ABNORMAL LOW (ref >60)
Glucose, Bld: 87 mg/dL (ref 70–99)
Potassium: 3.7 mmol/L (ref 3.5–5.1)
Sodium: 137 mmol/L (ref 135–145)
Total Bilirubin: 0.8 mg/dL (ref 0.3–1.2)
Total Protein: 5.5 g/dL — ABNORMAL LOW (ref 6.5–8.1)

## 2020-06-03 LAB — TYPE AND SCREEN
ABO/RH(D): O POS
Antibody Screen: NEGATIVE

## 2020-06-03 LAB — CBC WITH DIFFERENTIAL/PLATELET
Abs Immature Granulocytes: 0.09 10*3/uL — ABNORMAL HIGH (ref 0.00–0.07)
Basophils Absolute: 0 10*3/uL (ref 0.0–0.1)
Basophils Relative: 0 %
Eosinophils Absolute: 0.3 10*3/uL (ref 0.0–0.5)
Eosinophils Relative: 3 %
HCT: 19.6 % — ABNORMAL LOW (ref 39.0–52.0)
Hemoglobin: 6.9 g/dL — CL (ref 13.0–17.0)
Immature Granulocytes: 1 %
Lymphocytes Relative: 40 %
Lymphs Abs: 5.4 10*3/uL — ABNORMAL HIGH (ref 0.7–4.0)
MCH: 37.5 pg — ABNORMAL HIGH (ref 26.0–34.0)
MCHC: 35.2 g/dL (ref 30.0–36.0)
MCV: 106.5 fL — ABNORMAL HIGH (ref 80.0–100.0)
Monocytes Absolute: 0.9 10*3/uL (ref 0.1–1.0)
Monocytes Relative: 7 %
Neutro Abs: 6.9 10*3/uL (ref 1.7–7.7)
Neutrophils Relative %: 49 %
Platelets: 415 10*3/uL — ABNORMAL HIGH (ref 150–400)
RBC: 1.84 MIL/uL — ABNORMAL LOW (ref 4.22–5.81)
RDW: 23.9 % — ABNORMAL HIGH (ref 11.5–15.5)
WBC: 13.6 10*3/uL — ABNORMAL HIGH (ref 4.0–10.5)
nRBC: 0.4 % — ABNORMAL HIGH (ref 0.0–0.2)

## 2020-06-03 LAB — LACTATE DEHYDROGENASE: LDH: 393 U/L — ABNORMAL HIGH (ref 98–192)

## 2020-06-03 MED ORDER — NALOXONE HCL 0.4 MG/ML IJ SOLN: 0.4000 mg | INTRAMUSCULAR | Status: DC | PRN

## 2020-06-03 MED ORDER — SENNOSIDES-DOCUSATE SODIUM 8.6-50 MG PO TABS: 1.0000 | ORAL_TABLET | Freq: Two times a day (BID) | ORAL | Status: DC

## 2020-06-03 MED ORDER — POLYETHYLENE GLYCOL 3350 17 G PO PACK: 17.0000 g | PACK | Freq: Every day | ORAL | Status: DC | PRN

## 2020-06-03 MED ORDER — SODIUM CHLORIDE 0.9% FLUSH: 9.0000 mL | INTRAVENOUS | Status: DC | PRN

## 2020-06-03 MED ORDER — DEXTROSE-NACL 5-0.45 % IV SOLN: INTRAVENOUS | Status: DC

## 2020-06-03 MED ORDER — HYDROMORPHONE 1 MG/ML IV SOLN
INTRAVENOUS | Status: DC
  Administered 2020-06-03: 6 mg via INTRAVENOUS
  Administered 2020-06-03: 30 mg via INTRAVENOUS
  Filled 2020-06-03: qty 30

## 2020-06-03 NOTE — Telephone Encounter (Signed)
Patient called requesting to come to the day hospital for sickle cell pain. Patient reports left hand and bilateral knee pain rated 10/10. Reports taking Oxycodone and Oxycontin 1 hour ago. Patient was recently admitted and discharged from the hospital last week. COVID-19 screening done and patient denies all symptoms and exposures. Denies fever, chest pain, nausea, vomiting, diarrhea, abdominal pain and priapism. Admits to having transportation without driving self. Dr. Jonelle Sidle notified. Patient can come to the day hospital for pain management. Patient advised and expresses an understanding.

## 2020-06-03 NOTE — Progress Notes (Signed)
Patient admitted to the day hospital for treatment of sickle cell pain crisis. Patient reported pain rated 10/10 in the knees and left leg. Patient placed on Dilaudid PCA and hydrated with IV fluids. At discharge patient reported  pain at 5/10. Declined printed avs. Alert, oriented and ambulatory at discharge.

## 2020-06-03 NOTE — Progress Notes (Signed)
Critical Value: Hemoglobin 6.9  Date and Time Notified: 06/03/2020; 14:30 pm  Provider Notified: Harlow Ohms. MD  Orders received /action taken: No new orders at this time.

## 2020-06-03 NOTE — H&P (Signed)
[name] [name]. is an 40 y.o. male.   Chief Complaint: Pain in back and legs  HPI: Patient with sickle cell painful crisis being admitted for pain management.  He started having pain a few days ago.  Pain is persistent not responding to home regimen.  Has had no nausea vomiting diarrhea.  No fever.  Has chronic kidney disease stage III.  Also GERD.  Patient will be admitted for pain management.  Past Medical History:  Diagnosis Date  . Abscess of right iliac fossa 09/24/2018   required Perc Drain 09/24/2018  . Arachnoid Cyst of brain bilaterally    "2 really small ones in the back of my head; inside; saw them w/MRI" (09/25/2012)  . Bacterial pneumonia ~ 2012   "caught it here in the hospital" (09/25/2012)  . Chronic kidney disease    "from my sickle cell" (09/25/2012)  . CKD (chronic kidney disease), stage II   . Colitis 04/19/2017   CT scan abd/ pelvis  . GERD (gastroesophageal reflux disease)    "after I eat alot of spicey foods" (09/25/2012)  . Gynecomastia, male 07/10/2012  . History of blood transfusion    "always related to sickle cell crisis" (09/25/2012)  . 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   Noted in noted from Hematology notes at [facility]  . Migraines    "take RX qd to prevent them" (09/25/2012)  . Opioid dependence with withdrawal ([place]) 08/30/2012  . Renal insufficiency   . Sickle cell anemia (HCC)   . Sickle cell crisis ([place]) 09/25/2012  . Sickle cell nephropathy ([place]) 07/10/2012  . Sinus tachycardia   . Tachycardia with heart rate 121-140 beats per minute with ambulation 08/04/2016    Past Surgical History:  Procedure Laterality Date  . ABCESS DRAINAGE    . CHOLECYSTECTOMY  ~ 2012  . COLONOSCOPY N/A 04/23/2017   Procedure: COLONOSCOPY;  Surgeon: Irene Shipper, MD;  Location: [facility];  Service: Endoscopy;  Laterality: N/A;  . IR FLUORO GUIDE CV LINE RIGHT  12/17/2016  . IR RADIOLOGIST EVAL & MGMT  10/02/2018  . IR RADIOLOGIST EVAL & MGMT   10/15/2018  . IR REMOVAL TUN CV CATH W/O FL  12/21/2016  . IR US GUIDE VASC ACCESS RIGHT  12/17/2016  . spleenectomy      Family History  Problem Relation Age of Onset  . Breast cancer Mother    Social History:  reports that he has been smoking cigarettes. He has been smoking about 0.10 packs per day. He has never used smokeless tobacco. He reports current alcohol use. He reports current drug use. Drug: Marijuana.  Allergies:  Allergies  Allergen Reactions  . Nsaids Other (See Comments)    Kidney disease  . Morphine And Related Other (See Comments)    "Real bad headaches"     Medications Prior to Admission  Medication Sig Dispense Refill  . acetaminophen (TYLENOL) 500 MG tablet Take 1 tablet (500 mg total) by mouth every 6 (six) hours as needed. (Patient not taking: Reported on 05/21/2020) 30 tablet 0  . acetaminophen (TYLENOL) 650 MG CR tablet Take 650 mg by mouth every 8 (eight) hours as needed for pain.    Marland Kitchen allopurinol (ZYLOPRIM) 100 MG tablet Take 100 mg by mouth daily.    . Cholecalciferol (VITAMIN D3) 50 MCG (2000 UT) TABS Take 1 each by mouth daily. (Patient taking differently: Take 2,000 Units by mouth daily.) 30 tablet 11  . folic acid (FOLVITE) 1 MG tablet TAKE 1 TABLET  BY MOUTH EVERY DAY (Patient taking differently: Take 1 mg by mouth daily.) 90 tablet 4  . hydroxyurea (HYDREA) 500 MG capsule Take 500 mg by mouth daily.    Marland Kitchen LOKELMA 10 g PACK packet Take 10 g by mouth every other day.    . nortriptyline (PAMELOR) 25 MG capsule TAKE 1 CAPSULE BY MOUTH 2 TIMES DAILY. (Patient taking differently: Take 25 mg by mouth 2 (two) times daily.) 180 capsule 1  . OXBRYTA 500 MG TABS tablet Take 500 mg by mouth daily.    Marland Kitchen oxyCODONE (OXYCONTIN) 15 mg 12 hr tablet Take 1 tablet (15 mg total) by mouth every 12 (twelve) hours. 60 tablet 0  . Oxycodone HCl 10 MG TABS Take 1 tablet (10 mg total) by mouth every 6 (six) hours as needed (pain). 60 tablet 0  . sodium bicarbonate 650 MG tablet Take  1,950 mg by mouth 3 (three) times daily.      No results found for this or any previous visit (from the past 48 hour(s)). No results found.  Review of Systems  All other systems reviewed and are negative.   There were no vitals taken for this visit. Physical Exam Constitutional:      Appearance: He is ill-appearing.  HENT:     Head: Normocephalic and atraumatic.     Nose: Nose normal.     Mouth/Throat:     Mouth: Mucous membranes are dry.  Eyes:     Extraocular Movements: Extraocular movements intact.     Pupils: Pupils are equal, round, and reactive to light.  Cardiovascular:     Rate and Rhythm: Normal rate and regular rhythm.     Pulses: Normal pulses.     Heart sounds: Normal heart sounds.  Pulmonary:     Effort: Pulmonary effort is normal.     Breath sounds: Normal breath sounds.  Abdominal:     General: Bowel sounds are normal.     Palpations: Abdomen is soft.  Musculoskeletal:        General: Normal range of motion.     Cervical back: Normal range of motion and neck supple.  Skin:    General: Skin is warm and dry.  Neurological:     General: No focal deficit present.     Mental Status: He is alert and oriented to person, place, and time.  Psychiatric:        Mood and Affect: Mood normal.      Assessment/Plan Patient is a 40 year old gentleman with sickle cell painful crisis.  #1 sickle cell painful crisis: Patient will be admitted to the day hospital initiated on Dilaudid PCA Toradol and no IV fluids.  #2 chronic pain syndrome: Continue home regimen.  #3 tobacco abuse: Counseling provided  Timber Lucarelli,LAWAL, MD 06/03/2020, 9:04 AM

## 2020-06-03 NOTE — Discharge Instructions (Signed)
Sickle Cell Anemia, Adult  Sickle cell anemia is a condition where your red blood cells are shaped like sickles. Red blood cells carry oxygen through the body. Sickle-shaped cells do not live as long as normal red blood cells. They also clump together and block blood from flowing through the blood vessels. This prevents the body from getting enough oxygen. Sickle cell anemia causes organ damage and pain. It also increases the risk of infection. Follow these instructions at home: Medicines  Take over-the-counter and prescription medicines only as told by your doctor.  If you were prescribed an antibiotic medicine, take it as told by your doctor. Do not stop taking the antibiotic even if you start to feel better.  If you develop a fever, do not take medicines to lower the fever right away. Tell your doctor about the fever. Managing pain, stiffness, and swelling  Try these methods to help with pain: ? Use a heating pad. ? Take a warm bath. ? Distract yourself, such as by watching TV. Eating and drinking  Drink enough fluid to keep your pee (urine) clear or pale yellow. Drink more in hot weather and during exercise.  Limit or avoid alcohol.  Eat a healthy diet. Eat plenty of fruits, vegetables, whole grains, and lean protein.  Take vitamins and supplements as told by your doctor. Traveling  When traveling, keep these with you: ? Your medical information. ? The names of your doctors. ? Your medicines.  If you need to take an airplane, talk to your doctor first. Activity  Rest often.  Avoid exercises that make your heart beat much faster, such as jogging. General instructions  Do not use products that have nicotine or tobacco, such as cigarettes and e-cigarettes. If you need help quitting, ask your doctor.  Consider wearing a medical alert bracelet.  Avoid being in high places (high altitudes), such as mountains.  Avoid very hot or cold temperatures.  Avoid places where the  temperature changes a lot.  Keep all follow-up visits as told by your doctor. This is important. Contact a doctor if:  A joint hurts.  Your feet or hands hurt or swell.  You feel tired (fatigued). Get help right away if:  You have symptoms of infection. These include: ? Fever. ? Chills. ? Being very tired. ? Irritability. ? Poor eating. ? Throwing up (vomiting).  You feel dizzy or faint.  You have new stomach pain, especially on the left side.  You have a an erection (priapism) that lasts more than 4 hours.  You have numbness in your arms or legs.  You have a hard time moving your arms or legs.  You have trouble talking.  You have pain that does not go away when you take medicine.  You are short of breath.  You are breathing fast.  You have a long-term cough.  You have pain in your chest.  You have a bad headache.  You have a stiff neck.  Your stomach looks bloated even though you did not eat much.  Your skin is pale.  You suddenly cannot see well. Summary  Sickle cell anemia is a condition where your red blood cells are shaped like sickles.  Follow your doctor's advice on ways to manage pain, food to eat, activities to do, and steps to take for safe travel.  Get medical help right away if you have any signs of infection, such as a fever. This information is not intended to replace advice given to you by   your health care provider. Make sure you discuss any questions you have with your health care provider. Document Revised: 06/26/2019 Document Reviewed: 06/26/2019 Elsevier Patient Education  2021 [employer]  

## 2020-06-03 NOTE — Discharge Summary (Signed)
Physician Discharge Summary  Patient ID: [name] [name]. MRN: [id number] DOB/AGE: 06/25/1980 40 y.o.  Admit date: 06/03/2020 Discharge date: 06/03/2020  Admission Diagnoses:  Discharge Diagnoses:  Active Problems:   SICKLE CELL CRISIS   Discharged Condition: good  Hospital Course: Patient was admitted to the hospital with Dilaudid PCA, no Toradol but with IV fluids.  Pain dropped to 5 out of 10 at time of discharge.  Continue with home regimen.  Patient discharged home to follow-up with PCP and continue his home regimen.  Consults: None  Significant Diagnostic Studies: labs: CBC and CMP checked.  At baseline  Treatments: IV hydration and analgesia: Dilaudid  Discharge Exam: Blood pressure 105/60, pulse 99, temperature 97.8 F (36.6 C), temperature source Temporal, resp. rate 12, SpO2 99 %. General appearance: alert, cooperative, appears stated age and no distress Back: symmetric, no curvature. ROM normal. No CVA tenderness. Resp: clear to auscultation bilaterally Cardio: regular rate and rhythm, S1, S2 normal, no murmur, click, rub or gallop GI: soft, non-tender; bowel sounds normal; no masses,  no organomegaly Extremities: extremities normal, atraumatic, no cyanosis or edema Skin: Skin color, texture, turgor normal. No rashes or lesions Neurologic: Grossly normal  Disposition: Discharge disposition: 01-Home or Self Care       Discharge Instructions    Diet - low sodium heart healthy   Complete by: As directed    Increase activity slowly   Complete by: As directed      Allergies as of 06/03/2020      Reactions   Nsaids Other (See Comments)   Kidney disease   Morphine And Related Other (See Comments)   "Real bad headaches"       Medication List    TAKE these medications   acetaminophen 650 MG CR tablet Commonly known as: TYLENOL Take 650 mg by mouth every 8 (eight) hours as needed for pain. What changed: Another medication with the same name was  removed. Continue taking this medication, and follow the directions you see here.   allopurinol 100 MG tablet Commonly known as: ZYLOPRIM Take 100 mg by mouth daily.   folic acid 1 MG tablet Commonly known as: FOLVITE TAKE 1 TABLET BY MOUTH EVERY DAY   hydroxyurea 500 MG capsule Commonly known as: HYDREA Take 500 mg by mouth daily.   Lokelma 10 g Pack packet Generic drug: sodium zirconium cyclosilicate Take 10 g by mouth every other day.   nortriptyline 25 MG capsule Commonly known as: PAMELOR TAKE 1 CAPSULE BY MOUTH 2 TIMES DAILY.   Oxbryta 500 MG Tabs tablet Generic drug: voxelotor Take 500 mg by mouth daily.   OxyCONTIN 15 mg 12 hr tablet Generic drug: oxyCODONE Take 1 tablet (15 mg total) by mouth every 12 (twelve) hours.   Oxycodone HCl 10 MG Tabs Take 1 tablet (10 mg total) by mouth every 6 (six) hours as needed (pain).   sodium bicarbonate 650 MG tablet Take 1,950 mg by mouth 3 (three) times daily.   Vitamin D3 50 MCG (2000 UT) Tabs Take 1 each by mouth daily. What changed: how much to take        Signed: Naba Sneed,LAWAL 06/03/2020, 4:01 PM

## 2020-06-10 DIAGNOSIS — M25562 Pain in left knee: Secondary | ICD-10-CM | POA: Diagnosis not present

## 2020-06-10 DIAGNOSIS — M25462 Effusion, left knee: Secondary | ICD-10-CM | POA: Diagnosis not present

## 2020-06-10 DIAGNOSIS — D571 Sickle-cell disease without crisis: Secondary | ICD-10-CM | POA: Diagnosis not present

## 2020-06-10 DIAGNOSIS — G8929 Other chronic pain: Secondary | ICD-10-CM | POA: Diagnosis not present

## 2020-06-12 ENCOUNTER — Other Ambulatory Visit: Payer: Self-pay | Admitting: Family Medicine

## 2020-06-12 DIAGNOSIS — Z8669 Personal history of other diseases of the nervous system and sense organs: Secondary | ICD-10-CM

## 2020-06-12 DIAGNOSIS — G894 Chronic pain syndrome: Secondary | ICD-10-CM

## 2020-06-15 ENCOUNTER — Other Ambulatory Visit: Payer: Self-pay | Admitting: Family Medicine

## 2020-06-15 ENCOUNTER — Ambulatory Visit (INDEPENDENT_AMBULATORY_CARE_PROVIDER_SITE_OTHER): Payer: Medicare Other | Admitting: Family Medicine

## 2020-06-15 ENCOUNTER — Other Ambulatory Visit (HOSPITAL_COMMUNITY): Payer: Self-pay

## 2020-06-15 ENCOUNTER — Encounter: Payer: Self-pay | Admitting: Family Medicine

## 2020-06-15 ENCOUNTER — Other Ambulatory Visit: Payer: Self-pay

## 2020-06-15 VITALS — BP 117/82 | HR 82 | Temp 98.9°F | Ht 63.0 in | Wt 125.0 lb

## 2020-06-15 DIAGNOSIS — F1410 Cocaine abuse, uncomplicated: Secondary | ICD-10-CM

## 2020-06-15 DIAGNOSIS — F1123 Opioid dependence with withdrawal: Secondary | ICD-10-CM

## 2020-06-15 DIAGNOSIS — E559 Vitamin D deficiency, unspecified: Secondary | ICD-10-CM | POA: Diagnosis not present

## 2020-06-15 DIAGNOSIS — G894 Chronic pain syndrome: Secondary | ICD-10-CM

## 2020-06-15 DIAGNOSIS — D5700 Hb-SS disease with crisis, unspecified: Secondary | ICD-10-CM | POA: Diagnosis not present

## 2020-06-15 LAB — POCT URINALYSIS DIPSTICK
Bilirubin, UA: NEGATIVE
Glucose, UA: NEGATIVE
Ketones, UA: NEGATIVE
Leukocytes, UA: NEGATIVE
Nitrite, UA: NEGATIVE
Protein, UA: POSITIVE — AB
Spec Grav, UA: 1.02 (ref 1.010–1.025)
Urobilinogen, UA: 0.2 E.U./dL
pH, UA: 8.5 — AB (ref 5.0–8.0)

## 2020-06-15 MED ORDER — OXYCODONE HCL 10 MG PO TABS
10.0000 mg | ORAL_TABLET | Freq: Four times a day (QID) | ORAL | 0 refills | Status: DC | PRN
  Filled 2020-06-15: qty 60, 15d supply, fill #0

## 2020-06-15 NOTE — Progress Notes (Signed)
Patient [facility]     Subjective:  Patient ID: [name] [name]., male    DOB: 21-Aug-1980  Age: 40 y.o. MRN: [id number]  CC:  Chief Complaint  Patient presents with  . Follow-up    Having back and leg pain , sick cell pain     HPI [name] [name]. is a 40 year old male with a medical history significant for sickle cell disease type SS, chronic pain syndrome, opiate dependence and tolerance, history of cocaine abuse, chronic kidney disease stage IV, and history of anemia of chronic disease presents for follow-up of chronic conditions.  Patient continues to have significant pain primarily to low back and lower extremities.  He typically has pain daily.  Pain is mostly controlled on oxycodone and OxyContin.  He last had some oxycodone this a.m. with moderate improvement.  Patient is followed by hematology for sickle cell disease.  He was recently started on Oxbryta for sickle cell disease modification.  He has been taking medication consistently.  He has also continued with hydroxyurea.  Patient's baseline hemoglobin is 8-9 g/dL.  Patient has had frequent blood transfusions in the past due to symptomatic anemia. Patient has a history of chronic kidney disease stage IV.  He is followed closely by nephrology.  Currently he denies any headache, chest pain, shortness of breath, urinary symptoms, nausea, vomiting, or diarrhea.  Patient is up-to-date with eye exam and vaccinations.  Past Medical History:  Diagnosis Date  . Abscess of right iliac fossa 09/24/2018   required Perc Drain 09/24/2018  . Arachnoid Cyst of brain bilaterally    "2 really small ones in the back of my head; inside; saw them w/MRI" (09/25/2012)  . Bacterial pneumonia ~ 2012   "caught it here in the hospital" (09/25/2012)  . Chronic kidney disease    "from my sickle cell" (09/25/2012)  . CKD (chronic kidney disease), stage II   . Colitis 04/19/2017   CT scan abd/ pelvis  .  GERD (gastroesophageal reflux disease)    "after I eat alot of spicey foods" (09/25/2012)  . Gynecomastia, male 07/10/2012  . History of blood transfusion    "always related to sickle cell crisis" (09/25/2012)  . 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   Noted in noted from Hematology notes at [facility]  . Migraines    "take RX qd to prevent them" (09/25/2012)  . Opioid dependence with withdrawal ([place]) 08/30/2012  . Renal insufficiency   . Sickle cell anemia (HCC)   . Sickle cell crisis ([place]) 09/25/2012  . Sickle cell nephropathy ([place]) 07/10/2012  . Sinus tachycardia   . Tachycardia with heart rate 121-140 beats per minute with ambulation 08/04/2016    Past Surgical History:  Procedure Laterality Date  . ABCESS DRAINAGE    . CHOLECYSTECTOMY  ~ 2012  . COLONOSCOPY N/A 04/23/2017   Procedure: COLONOSCOPY;  Surgeon: Irene Shipper, MD;  Location: [facility];  Service: Endoscopy;  Laterality: N/A;  . IR FLUORO GUIDE CV LINE RIGHT  12/17/2016  . IR RADIOLOGIST EVAL & MGMT  10/02/2018  . IR RADIOLOGIST EVAL & MGMT  10/15/2018  . IR REMOVAL TUN CV CATH W/O FL  12/21/2016  . IR US GUIDE VASC ACCESS RIGHT  12/17/2016  . spleenectomy      Family History  Problem Relation Age of Onset  . Breast cancer Mother     Social History   Socioeconomic History  . Marital status: Single    Spouse  name: Not on file  . Number of children: Not on file  . Years of education: Not on file  . Highest education level: Not on file  Occupational History  . Not on file  Tobacco Use  . Smoking status: Current Some Day Smoker    Packs/day: 0.10    Types: Cigarettes  . Smokeless tobacco: Never Used  . Tobacco comment: 09/25/2012 "I don't buy cigarettes; bum one from friends q now and then"  Vaping Use  . Vaping Use: Never used  Substance and Sexual Activity  . Alcohol use: Yes    Alcohol/week: 0.0 standard drinks    Comment: Once a month  . Drug use: Yes    Types: Marijuana    Comment: Every  2-3 weeks   . Sexual activity: Yes  Other Topics Concern  . Not on file  Social History Narrative    Lives alone in [place].   Back at school, studying Public relations account executive. Unemployed.    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    Social Determinants of Health   Financial Resource Strain: Not on file  Food Insecurity: Not on file  Transportation Needs: Not on file  Physical Activity: Not on file  Stress: Not on file  Social Connections: Not on file  Intimate Partner Violence: Not on file    Outpatient Medications Prior to Visit  Medication Sig Dispense Refill  . acetaminophen (TYLENOL) 650 MG CR tablet Take 650 mg by mouth every 8 (eight) hours as needed for pain.    Marland Kitchen allopurinol (ZYLOPRIM) 100 MG tablet Take 100 mg by mouth daily.    . Cholecalciferol (VITAMIN D3) 50 MCG (2000 UT) TABS Take 1 each by mouth daily. (Patient taking differently: Take 2,000 Units by mouth daily.) 30 tablet 11  . folic acid (FOLVITE) 1 MG tablet TAKE 1 TABLET BY MOUTH EVERY DAY (Patient taking differently: Take 1 mg by mouth daily.) 90 tablet 4  . hydroxyurea (HYDREA) 500 MG capsule Take 500 mg by mouth daily.    Marland Kitchen LOKELMA 10 g PACK packet Take 10 g by mouth every other day.    . nortriptyline (PAMELOR) 25 MG capsule TAKE 1 CAPSULE BY MOUTH TWICE A DAY 180 capsule 1  . OXBRYTA 500 MG TABS tablet Take 500 mg by mouth daily.    Marland Kitchen oxyCODONE (OXYCONTIN) 15 mg 12 hr tablet Take 1 tablet (15 mg total) by mouth every 12 (twelve) hours. 60 tablet 0  . Oxycodone HCl 10 MG TABS Take 1 tablet (10 mg total) by mouth every 6 (six) hours as needed (pain). 60 tablet 0  . sodium bicarbonate 650 MG tablet Take 1,950 mg by mouth 3 (three) times daily.     No facility-administered medications prior to visit.    Allergies  Allergen Reactions  . Nsaids Other (See Comments)     Kidney disease  . Morphine And Related Other (See Comments)    "Real bad headaches"     ROS Review of Systems  Constitutional: Negative.   HENT: Negative.   Eyes: Negative.   Respiratory: Negative.   Cardiovascular: Negative.   Gastrointestinal: Negative.   Endocrine: Negative for polydipsia, polyphagia and polyuria.  Genitourinary: Negative.   Musculoskeletal: Positive for arthralgias and back pain.  Skin: Negative.   Neurological: Negative.   Psychiatric/Behavioral: Negative.  Objective:    Physical Exam Eyes:     Pupils: Pupils are equal, round, and reactive to light.  Cardiovascular:     Rate and Rhythm: Normal rate and regular rhythm.     Pulses: Normal pulses.  Pulmonary:     Effort: Pulmonary effort is normal.  Abdominal:     General: Bowel sounds are normal.  Neurological:     General: No focal deficit present.     Mental Status: He is alert. Mental status is at baseline.  Psychiatric:        Mood and Affect: Mood normal.        Behavior: Behavior normal.        Thought Content: Thought content normal.        Judgment: Judgment normal.     Ht '5\' 3"'  (1.6 m)   Wt 125 lb (56.7 kg)   BMI 22.14 kg/m  Wt Readings from Last 3 Encounters:  06/15/20 125 lb (56.7 kg)  05/24/20 115 lb 15.4 oz (52.6 kg)  04/20/20 119 lb (54 kg)     Health Maintenance Due  Topic Date Due  . COVID-19 Vaccine (3 - Booster for Moderna series) 12/27/2019    There are no preventive care reminders to display for this patient.  Lab Results  Component Value Date   TSH 2.860 05/11/2017   Lab Results  Component Value Date   WBC 13.6 (H) 06/03/2020   HGB 6.9 (LL) 06/03/2020   HCT 19.6 (L) 06/03/2020   MCV 106.5 (H) 06/03/2020   PLT 415 (H) 06/03/2020   Lab Results  Component Value Date   NA 137 06/03/2020   K 3.7 06/03/2020   CO2 22 06/03/2020   GLUCOSE 87 06/03/2020   BUN 27 (H) 06/03/2020   CREATININE 2.54 (H) 06/03/2020   BILITOT 0.8 06/03/2020   ALKPHOS  232 (H) 06/03/2020   AST 59 (H) 06/03/2020   ALT 31 06/03/2020   PROT 5.5 (L) 06/03/2020   ALBUMIN 2.2 (L) 06/03/2020   CALCIUM 7.4 (L) 06/03/2020   ANIONGAP 9 06/03/2020   EGFR 30 (L) 05/10/2020   Lab Results  Component Value Date   CHOL 145 03/07/2018   Lab Results  Component Value Date   HDL 65 03/07/2018   Lab Results  Component Value Date   LDLCALC 30 03/07/2018   Lab Results  Component Value Date   TRIG 251 (H) 03/07/2018   Lab Results  Component Value Date   CHOLHDL 2.2 03/07/2018   Lab Results  Component Value Date   HGBA1C <4.2 (L) 03/07/2018      Assessment & Plan:   Problem List Items Addressed This Visit      Other   Cocaine abuse ([place]) - Primary   Relevant Orders   267124 11+Oxyco+Alc+Crt-Bund   Chronic pain syndrome   Relevant Orders   580998 11+Oxyco+Alc+Crt-Bund     Sickle-cell disease with pain (HCC) - Sickle Cell Panel - Oxycodone HCl 10 MG TABS; Take 1 tablet (10 mg total) by mouth every 6 (six) hours as needed (pain).  Dispense: 60 tablet; Refill: 0 - POCT Urinalysis Dipstick  Cocaine abuse ([place]) - 338250 11+Oxyco+Alc+Crt-Bund  Chronic pain syndrome Reviewed PDMP substance reporting system prior to prescribing opiate medications. No inconsistencies noted.    - 539767 11+Oxyco+Alc+Crt-Bund - Oxycodone HCl 10 MG TABS; Take 1 tablet (10 mg total) by mouth every 6 (six) hours as needed (pain).  Dispense: 60 tablet; Refill: 0    Vitamin D deficiency - Sickle Cell  Panel   Follow-up: 3 month follow up    [place], MSN, FNP-C Patient [facility] [address] [place], [place] [address] [phone]

## 2020-06-15 NOTE — Patient Instructions (Signed)
Sickle Cell Anemia, Adult  Sickle cell anemia is a condition where your red blood cells are shaped like sickles. Red blood cells carry oxygen through the body. Sickle-shaped cells do not live as long as normal red blood cells. They also clump together and block blood from flowing through the blood vessels. This prevents the body from getting enough oxygen. Sickle cell anemia causes organ damage and pain. It also increases the risk of infection. Follow these instructions at home: Medicines  Take over-the-counter and prescription medicines only as told by your doctor.  If you were prescribed an antibiotic medicine, take it as told by your doctor. Do not stop taking the antibiotic even if you start to feel better.  If you develop a fever, do not take medicines to lower the fever right away. Tell your doctor about the fever. Managing pain, stiffness, and swelling  Try these methods to help with pain: ? Use a heating pad. ? Take a warm bath. ? Distract yourself, such as by watching TV. Eating and drinking  Drink enough fluid to keep your pee (urine) clear or pale yellow. Drink more in hot weather and during exercise.  Limit or avoid alcohol.  Eat a healthy diet. Eat plenty of fruits, vegetables, whole grains, and lean protein.  Take vitamins and supplements as told by your doctor. Traveling  When traveling, keep these with you: ? Your medical information. ? The names of your doctors. ? Your medicines.  If you need to take an airplane, talk to your doctor first. Activity  Rest often.  Avoid exercises that make your heart beat much faster, such as jogging. General instructions  Do not use products that have nicotine or tobacco, such as cigarettes and e-cigarettes. If you need help quitting, ask your doctor.  Consider wearing a medical alert bracelet.  Avoid being in high places (high altitudes), such as mountains.  Avoid very hot or cold temperatures.  Avoid places where the  temperature changes a lot.  Keep all follow-up visits as told by your doctor. This is important. Contact a doctor if:  A joint hurts.  Your feet or hands hurt or swell.  You feel tired (fatigued). Get help right away if:  You have symptoms of infection. These include: ? Fever. ? Chills. ? Being very tired. ? Irritability. ? Poor eating. ? Throwing up (vomiting).  You feel dizzy or faint.  You have new stomach pain, especially on the left side.  You have a an erection (priapism) that lasts more than 4 hours.  You have numbness in your arms or legs.  You have a hard time moving your arms or legs.  You have trouble talking.  You have pain that does not go away when you take medicine.  You are short of breath.  You are breathing fast.  You have a long-term cough.  You have pain in your chest.  You have a bad headache.  You have a stiff neck.  Your stomach looks bloated even though you did not eat much.  Your skin is pale.  You suddenly cannot see well. Summary  Sickle cell anemia is a condition where your red blood cells are shaped like sickles.  Follow your doctor's advice on ways to manage pain, food to eat, activities to do, and steps to take for safe travel.  Get medical help right away if you have any signs of infection, such as a fever. This information is not intended to replace advice given to you by   your health care provider. Make sure you discuss any questions you have with your health care provider. Document Revised: 06/26/2019 Document Reviewed: 06/26/2019 Elsevier Patient Education  2021 [employer]. Fatigue If you have fatigue, you feel tired all the time and have a lack of energy or a lack of motivation. Fatigue may make it difficult to start or complete tasks because of exhaustion. In general, occasional or mild fatigue is often a normal response to activity or life. However, long-lasting (chronic) or extreme fatigue may be a symptom of a  medical condition. Follow these instructions at home: General instructions  Watch your fatigue for any changes.  Go to bed and get up at the same time every day.  Avoid fatigue by pacing yourself during the day and getting enough sleep at night.  Maintain a healthy weight. Medicines  Take over-the-counter and prescription medicines only as told by your health care provider.  Take a multivitamin, if told by your health care provider.  Do not use herbal or dietary supplements unless they are approved by your health care provider. Activity  Exercise regularly, as told by your health care provider.  Use or practice techniques to help you relax, such as yoga, tai chi, meditation, or massage therapy.   Eating and drinking  Avoid heavy meals in the evening.  Eat a well-balanced diet, which includes lean proteins, whole grains, plenty of fruits and vegetables, and low-fat dairy products.  Avoid consuming too much caffeine.  Avoid the use of alcohol.  Drink enough fluid to keep your urine pale yellow.   Lifestyle  Change situations that cause you stress. Try to keep your work and personal schedule in balance.  Do not use any products that contain nicotine or tobacco, such as cigarettes and e-cigarettes. If you need help quitting, ask your health care provider.  Do not use drugs. Contact a health care provider if:  Your fatigue does not get better.  You have a fever.  You suddenly lose or gain weight.  You have headaches.  You have trouble falling asleep or sleeping through the night.  You feel angry, guilty, anxious, or sad.  You are unable to have a bowel movement (constipation).  Your skin is dry.  You have swelling in your legs or another part of your body. Get help right away if:  You feel confused.  Your vision is blurry.  You feel faint or you pass out.  You have a severe headache.  You have severe pain in your abdomen, your back, or the area  between your waist and hips (pelvis).  You have chest pain, shortness of breath, or an irregular or fast heartbeat.  You are unable to urinate, or you urinate less than normal.  You have abnormal bleeding, such as bleeding from the rectum, vagina, nose, lungs, or nipples.  You vomit blood.  You have thoughts about hurting yourself or others. If you ever feel like you may hurt yourself or others, or have thoughts about taking your own life, get help right away. You can go to your nearest emergency department or call:  Your local emergency services (911 in the [place]  A suicide crisis helpline, such as the [facility] at 606-721-8709. This is open 24 hours a day. Summary  If you have fatigue, you feel tired all the time and have a lack of energy or a lack of motivation.  Fatigue may make it difficult to start or complete tasks because of exhaustion.  Long-lasting (  chronic) or extreme fatigue may be a symptom of a medical condition.  Exercise regularly, as told by your health care provider.  Change situations that cause you stress. Try to keep your work and personal schedule in balance. This information is not intended to replace advice given to you by your health care provider. Make sure you discuss any questions you have with your health care provider. Document Revised: 08/21/2018 Document Reviewed: 10/25/2016 Elsevier Patient Education  2021 [employer].

## 2020-06-16 ENCOUNTER — Other Ambulatory Visit: Payer: Self-pay | Admitting: Family Medicine

## 2020-06-16 ENCOUNTER — Telehealth: Payer: Self-pay

## 2020-06-16 ENCOUNTER — Other Ambulatory Visit (HOSPITAL_COMMUNITY): Payer: Self-pay

## 2020-06-16 DIAGNOSIS — G894 Chronic pain syndrome: Secondary | ICD-10-CM

## 2020-06-16 LAB — CMP14+CBC/D/PLT+FER+RETIC+V...
ALT: 20 IU/L (ref 0–44)
AST: 31 IU/L (ref 0–40)
Albumin/Globulin Ratio: 0.9 — ABNORMAL LOW (ref 1.2–2.2)
Albumin: 2.6 g/dL — ABNORMAL LOW (ref 4.0–5.0)
Alkaline Phosphatase: 279 IU/L — ABNORMAL HIGH (ref 44–121)
BUN/Creatinine Ratio: 7 — ABNORMAL LOW (ref 9–20)
BUN: 17 mg/dL (ref 6–20)
Basophils Absolute: 0 10*3/uL (ref 0.0–0.2)
Basos: 0 %
Bilirubin Total: 0.5 mg/dL (ref 0.0–1.2)
CO2: 18 mmol/L — ABNORMAL LOW (ref 20–29)
Calcium: 7.7 mg/dL — ABNORMAL LOW (ref 8.7–10.2)
Chloride: 107 mmol/L — ABNORMAL HIGH (ref 96–106)
Creatinine, Ser: 2.43 mg/dL — ABNORMAL HIGH (ref 0.76–1.27)
EOS (ABSOLUTE): 0.2 10*3/uL (ref 0.0–0.4)
Eos: 1 %
Ferritin: 5379 ng/mL — ABNORMAL HIGH (ref 30–400)
Globulin, Total: 3 g/dL (ref 1.5–4.5)
Glucose: 91 mg/dL (ref 65–99)
Hematocrit: 24.5 % — ABNORMAL LOW (ref 37.5–51.0)
Hemoglobin: 9.3 g/dL — ABNORMAL LOW (ref 13.0–17.7)
Immature Grans (Abs): 0 10*3/uL (ref 0.0–0.1)
Immature Granulocytes: 0 %
Lymphocytes Absolute: 6 10*3/uL — ABNORMAL HIGH (ref 0.7–3.1)
Lymphs: 40 %
MCH: 40.4 pg — ABNORMAL HIGH (ref 26.6–33.0)
MCHC: 38 g/dL — ABNORMAL HIGH (ref 31.5–35.7)
MCV: 107 fL — ABNORMAL HIGH (ref 79–97)
Monocytes Absolute: 0.6 10*3/uL (ref 0.1–0.9)
Monocytes: 4 %
Neutrophils Absolute: 8.2 10*3/uL — ABNORMAL HIGH (ref 1.4–7.0)
Neutrophils: 55 %
Platelets: 506 10*3/uL — ABNORMAL HIGH (ref 150–450)
Potassium: 4 mmol/L (ref 3.5–5.2)
RBC: 2.3 x10E6/uL — CL (ref 4.14–5.80)
RDW: 19.7 % — ABNORMAL HIGH (ref 11.6–15.4)
Retic Ct Pct: 3.6 % — ABNORMAL HIGH (ref 0.6–2.6)
Sodium: 142 mmol/L (ref 134–144)
Total Protein: 5.6 g/dL — ABNORMAL LOW (ref 6.0–8.5)
Vit D, 25-Hydroxy: 18.9 ng/mL — ABNORMAL LOW (ref 30.0–100.0)
WBC: 15.1 10*3/uL — ABNORMAL HIGH (ref 3.4–10.8)
eGFR: 34 mL/min/{1.73_m2} — ABNORMAL LOW (ref >59)

## 2020-06-16 MED ORDER — OXYCODONE HCL ER 15 MG PO T12A
15.0000 mg | EXTENDED_RELEASE_TABLET | Freq: Two times a day (BID) | ORAL | 0 refills | Status: DC
  Filled 2020-06-26: qty 60, 30d supply, fill #0

## 2020-06-16 NOTE — Telephone Encounter (Signed)
oxycontin 15 mg

## 2020-06-16 NOTE — Progress Notes (Signed)
Reviewed PDMP substance reporting system prior to prescribing opiate medications. No inconsistencies noted.  Meds ordered this encounter  Medications   oxyCODONE (OXYCONTIN) 15 mg 12 hr tablet    Sig: Take 1 tablet (15 mg total) by mouth every 12 (twelve) hours.    Dispense:  60 tablet    Refill:  0    Order Specific Question:   Supervising Provider    Answer:   [name], [name] [[id number]]   [name] [name] [name] [name]  APRN, MSN, FNP-C [facility] [address]  [place], [place] [address] [phone]  

## 2020-06-17 ENCOUNTER — Other Ambulatory Visit (HOSPITAL_COMMUNITY): Payer: Self-pay

## 2020-06-17 ENCOUNTER — Telehealth: Payer: Self-pay

## 2020-06-17 ENCOUNTER — Other Ambulatory Visit: Payer: Self-pay | Admitting: Family Medicine

## 2020-06-17 NOTE — Telephone Encounter (Signed)
Pt asking on why his medication was denied   Oxycontin

## 2020-06-23 ENCOUNTER — Other Ambulatory Visit (HOSPITAL_COMMUNITY): Payer: Self-pay

## 2020-06-23 DIAGNOSIS — N1832 Chronic kidney disease, stage 3b: Secondary | ICD-10-CM | POA: Diagnosis not present

## 2020-06-23 DIAGNOSIS — E875 Hyperkalemia: Secondary | ICD-10-CM | POA: Diagnosis not present

## 2020-06-23 DIAGNOSIS — G894 Chronic pain syndrome: Secondary | ICD-10-CM | POA: Diagnosis not present

## 2020-06-23 DIAGNOSIS — D5700 Hb-SS disease with crisis, unspecified: Secondary | ICD-10-CM | POA: Diagnosis not present

## 2020-06-23 DIAGNOSIS — N2581 Secondary hyperparathyroidism of renal origin: Secondary | ICD-10-CM | POA: Diagnosis not present

## 2020-06-23 DIAGNOSIS — N051 Unspecified nephritic syndrome with focal and segmental glomerular lesions: Secondary | ICD-10-CM | POA: Diagnosis not present

## 2020-06-23 DIAGNOSIS — E872 Acidosis: Secondary | ICD-10-CM | POA: Diagnosis not present

## 2020-06-26 ENCOUNTER — Other Ambulatory Visit (HOSPITAL_COMMUNITY): Payer: Self-pay

## 2020-06-27 IMAGING — DX DG CHEST 1V PORT
1 series · 1 of 1 positions shown · non-contrast
Comparison: 11/30/2018

CLINICAL DATA: Sickle cell crisis and fevers

EXAM:
PORTABLE CHEST 1 VIEW

[chest ap]
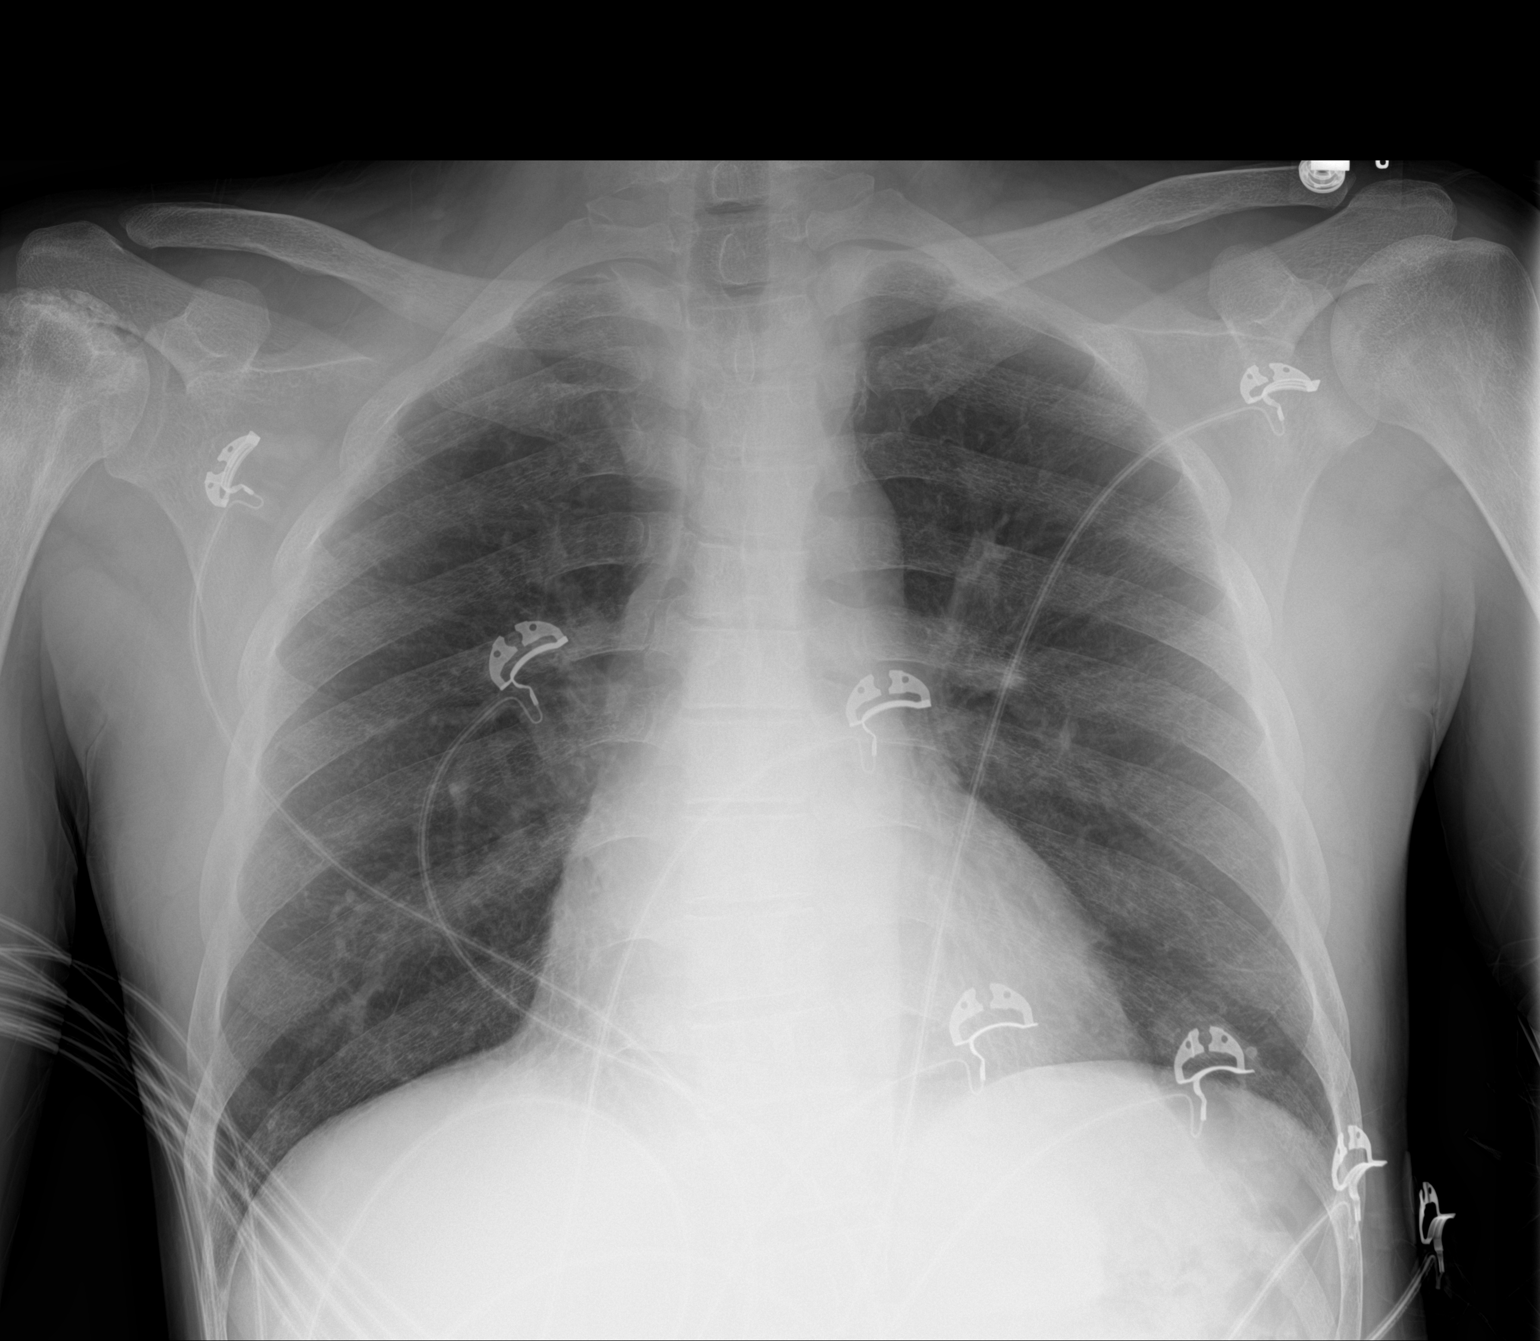

[1 of 1 positions shown; findings below may reference images not displayed]

FINDINGS: Cardiac shadows within normal limits. The lungs are well aerated
bilaterally. No focal infiltrate or sizable effusion is seen. No
acute bony abnormality is noted. Chronic changes the right humeral
head seen consistent with avascular necrosis.
IMPRESSION: No acute abnormality noted.

## 2020-06-30 DIAGNOSIS — N184 Chronic kidney disease, stage 4 (severe): Secondary | ICD-10-CM | POA: Diagnosis not present

## 2020-06-30 DIAGNOSIS — Z79899 Other long term (current) drug therapy: Secondary | ICD-10-CM | POA: Diagnosis not present

## 2020-06-30 DIAGNOSIS — E790 Hyperuricemia without signs of inflammatory arthritis and tophaceous disease: Secondary | ICD-10-CM | POA: Diagnosis not present

## 2020-06-30 DIAGNOSIS — D571 Sickle-cell disease without crisis: Secondary | ICD-10-CM | POA: Diagnosis not present

## 2020-06-30 IMAGING — DX DG CHEST 1V
1 series · 1 of 1 positions shown · non-contrast
Comparison: 01/21/2019

CLINICAL DATA: Diffuse anterior chest pain.

EXAM:
CHEST  1 VIEW

[chest ap]
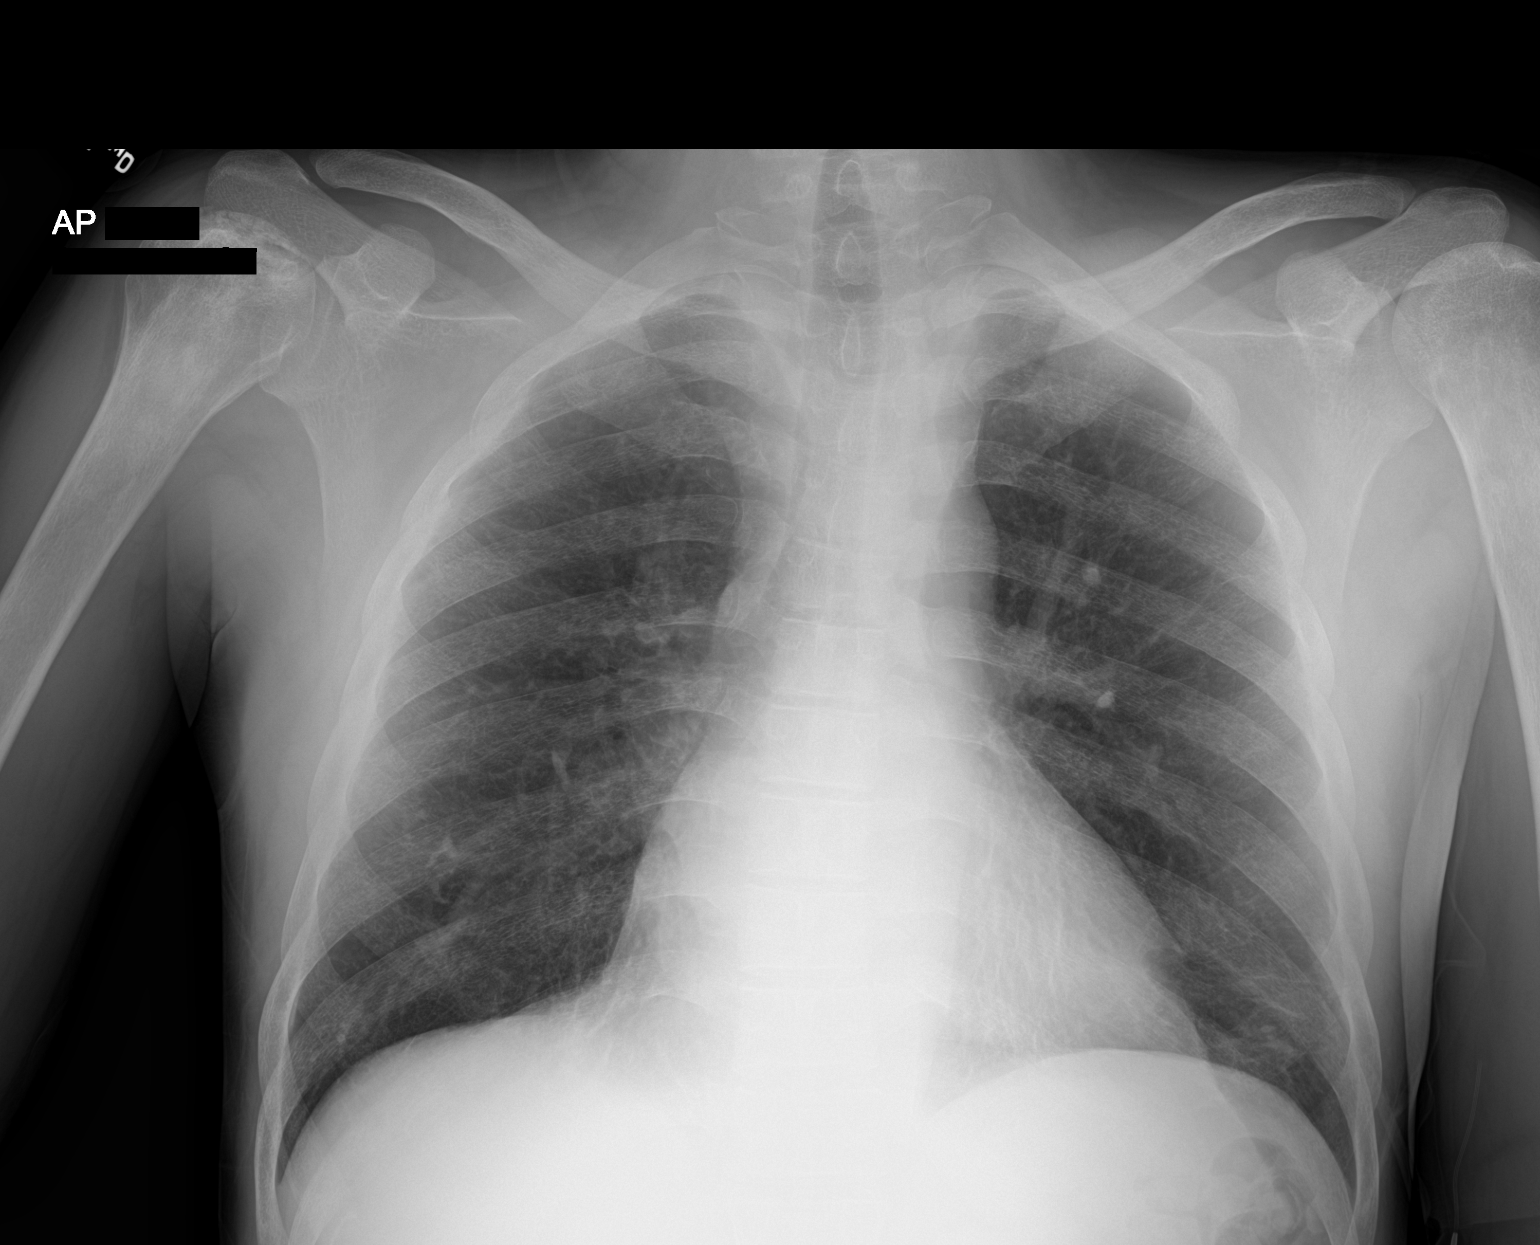

[1 of 1 positions shown; findings below may reference images not displayed]

FINDINGS: Borderline enlarged cardiac silhouette. Interval small amount of
linear atelectasis or scarring at the left lung base. Otherwise,
clear lungs. Stable sclerosis in the proximal right humerus,
including the humeral head, compatible with avascular necrosis.
IMPRESSION: Interval small amount of linear atelectasis or scarring at the left
lung base.

## 2020-07-01 ENCOUNTER — Non-Acute Institutional Stay (HOSPITAL_COMMUNITY)
Admission: AD | Admit: 2020-07-01 | Discharge: 2020-07-01 | Disposition: A | Discharge status: Home / Self Care | Payer: Medicare Other | Source: Ambulatory Visit | Attending: Internal Medicine | Admitting: Internal Medicine

## 2020-07-01 ENCOUNTER — Telehealth (HOSPITAL_COMMUNITY): Payer: Self-pay | Admitting: General Practice

## 2020-07-01 ENCOUNTER — Encounter (HOSPITAL_COMMUNITY): Payer: Self-pay | Admitting: Family Medicine

## 2020-07-01 DIAGNOSIS — Z886 Allergy status to analgesic agent status: Secondary | ICD-10-CM | POA: Diagnosis not present

## 2020-07-01 DIAGNOSIS — Z803 Family history of malignant neoplasm of breast: Secondary | ICD-10-CM | POA: Insufficient documentation

## 2020-07-01 DIAGNOSIS — F1410 Cocaine abuse, uncomplicated: Secondary | ICD-10-CM

## 2020-07-01 DIAGNOSIS — N184 Chronic kidney disease, stage 4 (severe): Secondary | ICD-10-CM | POA: Diagnosis not present

## 2020-07-01 DIAGNOSIS — F1910 Other psychoactive substance abuse, uncomplicated: Secondary | ICD-10-CM | POA: Diagnosis not present

## 2020-07-01 DIAGNOSIS — F17210 Nicotine dependence, cigarettes, uncomplicated: Secondary | ICD-10-CM | POA: Insufficient documentation

## 2020-07-01 DIAGNOSIS — K219 Gastro-esophageal reflux disease without esophagitis: Secondary | ICD-10-CM | POA: Insufficient documentation

## 2020-07-01 DIAGNOSIS — D5700 Hb-SS disease with crisis, unspecified: Secondary | ICD-10-CM | POA: Diagnosis present

## 2020-07-01 DIAGNOSIS — Z885 Allergy status to narcotic agent status: Secondary | ICD-10-CM | POA: Insufficient documentation

## 2020-07-01 DIAGNOSIS — F1120 Opioid dependence, uncomplicated: Secondary | ICD-10-CM | POA: Insufficient documentation

## 2020-07-01 DIAGNOSIS — D638 Anemia in other chronic diseases classified elsewhere: Secondary | ICD-10-CM | POA: Insufficient documentation

## 2020-07-01 LAB — BASIC METABOLIC PANEL
Anion gap: 6 (ref 5–15)
BUN: 26 mg/dL — ABNORMAL HIGH (ref 6–20)
CO2: 26 mmol/L (ref 22–32)
Calcium: 8 mg/dL — ABNORMAL LOW (ref 8.9–10.3)
Chloride: 101 mmol/L (ref 98–111)
Creatinine, Ser: 2.72 mg/dL — ABNORMAL HIGH (ref 0.61–1.24)
GFR, Estimated: 30 mL/min — ABNORMAL LOW (ref >60)
Glucose, Bld: 96 mg/dL (ref 70–99)
Potassium: 4 mmol/L (ref 3.5–5.1)
Sodium: 133 mmol/L — ABNORMAL LOW (ref 135–145)

## 2020-07-01 LAB — DRUG SCREEN 764883 11+OXYCO+ALC+CRT-BUND
Amphetamines, Urine: NEGATIVE ng/mL
BENZODIAZ UR QL: NEGATIVE ng/mL
Barbiturate: NEGATIVE ng/mL
Cocaine (Metabolite): NEGATIVE ng/mL
Creatinine: 86.8 mg/dL (ref 20.0–300.0)
Ethanol: NEGATIVE %
Meperidine: NEGATIVE ng/mL
Methadone Screen, Urine: NEGATIVE ng/mL
OPIATE SCREEN URINE: NEGATIVE ng/mL
Phencyclidine: NEGATIVE ng/mL
Propoxyphene: NEGATIVE ng/mL
Tramadol: NEGATIVE ng/mL
pH, Urine: 7.8 (ref 4.5–8.9)

## 2020-07-01 LAB — COMPREHENSIVE METABOLIC PANEL
ALT: 33 U/L (ref 0–44)
AST: 49 U/L — ABNORMAL HIGH (ref 15–41)
Albumin: 2.6 g/dL — ABNORMAL LOW (ref 3.5–5.0)
Alkaline Phosphatase: 302 U/L — ABNORMAL HIGH (ref 38–126)
Anion gap: 9 (ref 5–15)
BUN: 28 mg/dL — ABNORMAL HIGH (ref 6–20)
CO2: 25 mmol/L (ref 22–32)
Calcium: 8.1 mg/dL — ABNORMAL LOW (ref 8.9–10.3)
Chloride: 100 mmol/L (ref 98–111)
Creatinine, Ser: 3.01 mg/dL — ABNORMAL HIGH (ref 0.61–1.24)
GFR, Estimated: 26 mL/min — ABNORMAL LOW (ref >60)
Glucose, Bld: 84 mg/dL (ref 70–99)
Potassium: 3.7 mmol/L (ref 3.5–5.1)
Sodium: 134 mmol/L — ABNORMAL LOW (ref 135–145)
Total Bilirubin: 1.4 mg/dL — ABNORMAL HIGH (ref 0.3–1.2)
Total Protein: 6.4 g/dL — ABNORMAL LOW (ref 6.5–8.1)

## 2020-07-01 LAB — CBC WITH DIFFERENTIAL/PLATELET
Abs Immature Granulocytes: 0.06 10*3/uL (ref 0.00–0.07)
Basophils Absolute: 0 10*3/uL (ref 0.0–0.1)
Basophils Relative: 0 %
Eosinophils Absolute: 0.3 10*3/uL (ref 0.0–0.5)
Eosinophils Relative: 3 %
HCT: 20 % — ABNORMAL LOW (ref 39.0–52.0)
Hemoglobin: 7.2 g/dL — ABNORMAL LOW (ref 13.0–17.0)
Immature Granulocytes: 1 %
Lymphocytes Relative: 35 %
Lymphs Abs: 4.4 10*3/uL — ABNORMAL HIGH (ref 0.7–4.0)
MCH: 40.9 pg — ABNORMAL HIGH (ref 26.0–34.0)
MCHC: 36 g/dL (ref 30.0–36.0)
MCV: 113.6 fL — ABNORMAL HIGH (ref 80.0–100.0)
Monocytes Absolute: 1.3 10*3/uL — ABNORMAL HIGH (ref 0.1–1.0)
Monocytes Relative: 10 %
Neutro Abs: 6.5 10*3/uL (ref 1.7–7.7)
Neutrophils Relative %: 51 %
Platelets: 207 10*3/uL (ref 150–400)
RBC: 1.76 MIL/uL — ABNORMAL LOW (ref 4.22–5.81)
RDW: 29.1 % — ABNORMAL HIGH (ref 11.5–15.5)
WBC: 12.5 10*3/uL — ABNORMAL HIGH (ref 4.0–10.5)
nRBC: 0.6 % — ABNORMAL HIGH (ref 0.0–0.2)

## 2020-07-01 LAB — URINALYSIS, ROUTINE W REFLEX MICROSCOPIC
Bacteria, UA: NONE SEEN
Bilirubin Urine: NEGATIVE
Glucose, UA: 150 mg/dL — AB
Ketones, ur: NEGATIVE mg/dL
Leukocytes,Ua: NEGATIVE
Nitrite: NEGATIVE
Protein, ur: 100 mg/dL — AB
Specific Gravity, Urine: 1.008 (ref 1.005–1.030)
pH: 8 (ref 5.0–8.0)

## 2020-07-01 LAB — RAPID URINE DRUG SCREEN, HOSP PERFORMED
Amphetamines: NOT DETECTED
Barbiturates: NOT DETECTED
Benzodiazepines: POSITIVE — AB
Cocaine: POSITIVE — AB
Opiates: POSITIVE — AB
Tetrahydrocannabinol: POSITIVE — AB

## 2020-07-01 LAB — OXYCODONE/OXYMORPHONE, CONFIRM: OXYCODONE/OXYMORPH: NEGATIVE

## 2020-07-01 LAB — CANNABINOID CONFIRMATION, UR
CANNABINOIDS: POSITIVE — AB
Carboxy THC GC/MS Conf: >750 ng/mL

## 2020-07-01 LAB — RETICULOCYTES
Immature Retic Fract: 27.7 % — ABNORMAL HIGH (ref 2.3–15.9)
RBC.: 1.74 MIL/uL — ABNORMAL LOW (ref 4.22–5.81)
Retic Count, Absolute: 318.5 10*3/uL — ABNORMAL HIGH (ref 19.0–186.0)
Retic Ct Pct: 19.3 % — ABNORMAL HIGH (ref 0.4–3.1)

## 2020-07-01 LAB — TYPE AND SCREEN
ABO/RH(D): O POS
Antibody Screen: NEGATIVE

## 2020-07-01 LAB — LACTATE DEHYDROGENASE: LDH: 277 U/L — ABNORMAL HIGH (ref 98–192)

## 2020-07-01 MED ORDER — SODIUM CHLORIDE 0.45 % IV SOLN: INTRAVENOUS | Status: DC

## 2020-07-01 MED ORDER — SODIUM CHLORIDE 0.9% FLUSH: 9.0000 mL | INTRAVENOUS | Status: DC | PRN

## 2020-07-01 MED ORDER — HYDROMORPHONE 1 MG/ML IV SOLN
INTRAVENOUS | Status: DC
  Administered 2020-07-01: 30 mg via INTRAVENOUS
  Administered 2020-07-01: 9 mg via INTRAVENOUS
  Filled 2020-07-01: qty 30

## 2020-07-01 MED ORDER — NALOXONE HCL 0.4 MG/ML IJ SOLN: 0.4000 mg | INTRAMUSCULAR | Status: DC | PRN

## 2020-07-01 NOTE — Progress Notes (Signed)
Patient admitted to the day infusion hospital for sickle cell pain. Initially, patient reported bilateral knee pain rated 8/10. For pain management, patient placed on Dilaudid PCA and hydrated with IV fluids. At discharge, patient rated pain at 3/10. Vital signs stable. BMP drawn to reassess patient's kidney function.  Discharge instructions given. Patient alert, oriented and ambulatory at discharge.

## 2020-07-01 NOTE — Telephone Encounter (Signed)
Patient called, requesting to come to the day hospital due to pain in the knees and legs rated at 8/10. Denied chest pain, fever, diarrhea, abdominal pain, nausea/vomitting and priapism. Screened negative for Covid-19 symptoms. Admitted to having means of transportation without driving self after treatment. Last took 10 mg of oxycodone and 15 mg of oxycontin. Per provider, patient can come to the day hospital for treatment. Patient also told to follow up with his nephrologist with regards to kidney issues. Patient notified, verbalized understanding.

## 2020-07-01 NOTE — H&P (Signed)
[facility] History and Physical   Date: 07/02/2020  Patient name: [name] [name]. Medical record number: [id number] Date of birth: [birth year] Age: 40 y.o. Gender: male PCP: [name], FNP  Attending physician: No att. providers found  Chief Complaint: Sickle cell pain  History of Present Illness: [name] [name] is a 40 year old male with a medical history significant for sickle cell disease, chronic kidney disease stage IV, opiate dependence and tolerance, history of cocaine abuse, chronic marijuana use, and history of anemia of chronic disease presents with complaints of generalized pain that is consistent with his typical pain crisis.  Patient was evaluated by [facility] hematology on yesterday, who stated that renal failure was worsening and that he needed to contact his PCP.  He stated that creatinine was over 3 and he was advised to stay for hydration, but was unable to due to transportation constraints.  Patient is complaining of increased pain today that is unrelieved by his home medications.  He last had oxycodone and OxyContin this a.m. without sustained relief.  Patient endorses some fatigue.  He denies headache, chest pain, shortness of breath, urinary symptoms, nausea, vomiting, or diarrhea.  No sick contacts, recent travel, or exposure to COVID-19.  Meds: No medications prior to admission.    Allergies: Nsaids and Morphine and related Past Medical History:  Diagnosis Date  . Abscess of right iliac fossa 09/24/2018   required Perc Drain 09/24/2018  . Arachnoid Cyst of brain bilaterally    "2 really small ones in the back of my head; inside; saw them w/MRI" (09/25/2012)  . Bacterial pneumonia ~ 2012   "caught it here in the hospital" (09/25/2012)  . Chronic kidney disease    "from my sickle cell" (09/25/2012)  . CKD (chronic kidney disease), stage II   . Colitis 04/19/2017   CT scan abd/ pelvis  . GERD (gastroesophageal reflux disease)    "after  I eat alot of spicey foods" (09/25/2012)  . Gynecomastia, male 07/10/2012  . History of blood transfusion    "always related to sickle cell crisis" (09/25/2012)  . 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   Noted in noted from Hematology notes at [facility]  . Migraines    "take RX qd to prevent them" (09/25/2012)  . Opioid dependence with withdrawal ([place]) 08/30/2012  . Renal insufficiency   . Sickle cell anemia (HCC)   . Sickle cell crisis ([place]) 09/25/2012  . Sickle cell nephropathy ([place]) 07/10/2012  . Sinus tachycardia   . Tachycardia with heart rate 121-140 beats per minute with ambulation 08/04/2016   Past Surgical History:  Procedure Laterality Date  . ABCESS DRAINAGE    . CHOLECYSTECTOMY  ~ 2012  . COLONOSCOPY N/A 04/23/2017   Procedure: COLONOSCOPY;  Surgeon: Irene Shipper, MD;  Location: [facility];  Service: Endoscopy;  Laterality: N/A;  . IR FLUORO GUIDE CV LINE RIGHT  12/17/2016  . IR RADIOLOGIST EVAL & MGMT  10/02/2018  . IR RADIOLOGIST EVAL & MGMT  10/15/2018  . IR REMOVAL TUN CV CATH W/O FL  12/21/2016  . IR US GUIDE VASC ACCESS RIGHT  12/17/2016  . spleenectomy     Family History  Problem Relation Age of Onset  . Breast cancer Mother    Social History   Socioeconomic History  . Marital status: Single    Spouse name: Not on file  . Number of children: Not on file  . Years of education: Not on file  . Highest education level: Not  on file  Occupational History  . Not on file  Tobacco Use  . Smoking status: Current Some Day Smoker    Packs/day: 0.10    Types: Cigarettes  . Smokeless tobacco: Never Used  . Tobacco comment: 09/25/2012 "I don't buy cigarettes; bum one from friends q now and then"  Vaping Use  . Vaping Use: Never used  Substance and Sexual Activity  . Alcohol use: Yes    Alcohol/week: 0.0 standard drinks    Comment: Once a month  . Drug use: Yes    Types: Marijuana    Comment: Every 2-3 weeks   . Sexual activity: Yes  Other Topics Concern   . Not on file  Social History Narrative    Lives alone in [place].   Back at school, studying Public relations account executive. Unemployed.    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    Social Determinants of Health   Financial Resource Strain: Not on file  Food Insecurity: Not on file  Transportation Needs: Not on file  Physical Activity: Not on file  Stress: Not on file  Social Connections: Not on file  Intimate Partner Violence: Not on file   Review of Systems  Constitutional: Positive for malaise/fatigue. Negative for chills and fever.  HENT: Negative.   Eyes: Negative.   Respiratory: Negative.   Cardiovascular: Negative.   Gastrointestinal: Negative.   Genitourinary: Negative.   Musculoskeletal: Positive for back pain and joint pain.  Skin: Negative.   Neurological: Negative.   Endo/Heme/Allergies: Negative.   Psychiatric/Behavioral: Positive for substance abuse.   Physical Exam: Blood pressure 118/78, pulse 97, temperature 97.9 F (36.6 C), temperature source Oral, resp. rate 10, SpO2 92 %. Physical Exam Constitutional:      Appearance: Normal appearance.  Eyes:     Pupils: Pupils are equal, round, and reactive to light.  Cardiovascular:     Rate and Rhythm: Normal rate and regular rhythm.     Pulses: Normal pulses.     Heart sounds: Murmur heard.    Pulmonary:     Effort: Pulmonary effort is normal.  Abdominal:     General: Bowel sounds are normal.  Musculoskeletal:        General: Normal range of motion.  Skin:    General: Skin is warm.  Neurological:     General: No focal deficit present.     Mental Status: He is alert. Mental status is at baseline.  Psychiatric:        Mood and Affect: Mood normal.        Behavior: Behavior normal.        Thought Content: Thought content normal.        Judgment: Judgment normal.       Lab results: No results found for this or any previous visit (from the past 24 hour(s)).  Imaging results:  No results found.   Assessment & Plan: Patient admitted to sickle cell day infusion center for management of pain crisis.  Patient is opiate tolerant Initiate IV dilaudid PCA. Settings of 0.5 mg, 10 minute lockout and 3 mg/hr IV fluids,  0.45% saline at 100 ml/hr Tylenol 1000 mg by mouth times one dose Review CBC with differential, complete metabolic panel, and reticulocytes as results become available. Baseline hemoglobin is 7-8 g/dL Pain intensity will be reevaluated in context of functioning and relationship to  baseline as care progresses If pain intensity remains elevated and/or sudden change in hemodynamic stability transition to inpatient services for higher level of care.    [name]  APRN, MSN, FNP-C [facility] [address] [place], [place] [address] [phone]   07/02/2020, 8:20 PM

## 2020-07-01 NOTE — Discharge Instructions (Signed)
Sickle Cell Anemia, Adult  Sickle cell anemia is a condition where your red blood cells are shaped like sickles. Red blood cells carry oxygen through the body. Sickle-shaped cells do not live as long as normal red blood cells. They also clump together and block blood from flowing through the blood vessels. This prevents the body from getting enough oxygen. Sickle cell anemia causes organ damage and pain. It also increases the risk of infection. Follow these instructions at home: Medicines  Take over-the-counter and prescription medicines only as told by your doctor.  If you were prescribed an antibiotic medicine, take it as told by your doctor. Do not stop taking the antibiotic even if you start to feel better.  If you develop a fever, do not take medicines to lower the fever right away. Tell your doctor about the fever. Managing pain, stiffness, and swelling  Try these methods to help with pain: ? Use a heating pad. ? Take a warm bath. ? Distract yourself, such as by watching TV. Eating and drinking  Drink enough fluid to keep your pee (urine) clear or pale yellow. Drink more in hot weather and during exercise.  Limit or avoid alcohol.  Eat a healthy diet. Eat plenty of fruits, vegetables, whole grains, and lean protein.  Take vitamins and supplements as told by your doctor. Traveling  When traveling, keep these with you: ? Your medical information. ? The names of your doctors. ? Your medicines.  If you need to take an airplane, talk to your doctor first. Activity  Rest often.  Avoid exercises that make your heart beat much faster, such as jogging. General instructions  Do not use products that have nicotine or tobacco, such as cigarettes and e-cigarettes. If you need help quitting, ask your doctor.  Consider wearing a medical alert bracelet.  Avoid being in high places (high altitudes), such as mountains.  Avoid very hot or cold temperatures.  Avoid places where the  temperature changes a lot.  Keep all follow-up visits as told by your doctor. This is important. Contact a doctor if:  A joint hurts.  Your feet or hands hurt or swell.  You feel tired (fatigued). Get help right away if:  You have symptoms of infection. These include: ? Fever. ? Chills. ? Being very tired. ? Irritability. ? Poor eating. ? Throwing up (vomiting).  You feel dizzy or faint.  You have new stomach pain, especially on the left side.  You have a an erection (priapism) that lasts more than 4 hours.  You have numbness in your arms or legs.  You have a hard time moving your arms or legs.  You have trouble talking.  You have pain that does not go away when you take medicine.  You are short of breath.  You are breathing fast.  You have a long-term cough.  You have pain in your chest.  You have a bad headache.  You have a stiff neck.  Your stomach looks bloated even though you did not eat much.  Your skin is pale.  You suddenly cannot see well. Summary  Sickle cell anemia is a condition where your red blood cells are shaped like sickles.  Follow your doctor's advice on ways to manage pain, food to eat, activities to do, and steps to take for safe travel.  Get medical help right away if you have any signs of infection, such as a fever. This information is not intended to replace advice given to you by   your health care provider. Make sure you discuss any questions you have with your health care provider. Document Revised: 06/26/2019 Document Reviewed: 06/26/2019 Elsevier Patient Education  2021 [employer]  

## 2020-07-03 NOTE — Discharge Summary (Addendum)
[facility] Discharge Summary   Patient ID: [name] [name]. MRN: [id number] DOB/AGE: [birth year] 40 y.o.  Admit date: 07/01/2020 Discharge date: 07/01/2020  Primary Care Physician:  [name], FNP  Admission Diagnoses:  Principal Problem:   Sickle cell pain crisis [facility]) Active Problems:   Chronic kidney disease (CKD), stage IV (severe) ([place])   Cocaine abuse ([place])   Polysubstance abuse ([place])   Discharge Medications:  Allergies as of 07/01/2020      Reactions   Nsaids Other (See Comments)   Kidney disease   Morphine And Related Other (See Comments)   "Real bad headaches"       Medication List    ASK your doctor about these medications   acetaminophen 650 MG CR tablet Commonly known as: TYLENOL Take 650 mg by mouth every 8 (eight) hours as needed for pain.   allopurinol 100 MG tablet Commonly known as: ZYLOPRIM Take 100 mg by mouth daily.   folic acid 1 MG tablet Commonly known as: FOLVITE TAKE 1 TABLET BY MOUTH EVERY DAY   hydroxyurea 500 MG capsule Commonly known as: HYDREA Take 500 mg by mouth daily.   Lokelma 10 g Pack packet Generic drug: sodium zirconium cyclosilicate Take 10 g by mouth every other day.   nortriptyline 25 MG capsule Commonly known as: PAMELOR TAKE 1 CAPSULE BY MOUTH TWICE A DAY   Oxbryta 500 MG Tabs tablet Generic drug: voxelotor Take 500 mg by mouth daily.   Oxycodone HCl 10 MG Tabs Take 1 tablet (10 mg total) by mouth every 6 (six) hours as needed (pain).   OxyCONTIN 15 mg 12 hr tablet Generic drug: oxyCODONE Take 1 tablet (15 mg total) by mouth every 12 (twelve) hours.   sodium bicarbonate 650 MG tablet Take 1,950 mg by mouth 3 (three) times daily.   Vitamin D3 50 MCG (2000 UT) Tabs Take 1 each by mouth daily.        Consults:  None  Significant Diagnostic Studies:  No results found.  History of present illness:  [name] [name] is a 40 year old male with a medical history significant  for sickle cell disease, chronic kidney disease stage IV, opiate dependence and tolerance, history of cocaine abuse, chronic marijuana use, and history of anemia of chronic disease presents with complaints of generalized pain that is consistent with his typical pain crisis.  Patient was evaluated by [facility] hematology on yesterday, who stated that renal failure was worsening and that he needed to contact his PCP.  He stated that creatinine was over 3 and he was advised to stay for hydration, but was unable to due to transportation constraints.  Patient is complaining of increased pain today that is unrelieved by his home medications.  He last had oxycodone and OxyContin this a.m. without sustained relief.  Patient endorses some fatigue.  He denies headache, chest pain, shortness of breath, urinary symptoms, nausea, vomiting, or diarrhea.  No sick contacts, recent travel, or exposure to COVID-19.  [facility] Course:  Sickle cell disease with pain crisis: Patient admitted to sickle cell day infusion center for management of pain crisis. Pain managed with IV Dilaudid PCA IV fluids, 0.45% saline at 100 mL/h Pain intensity decreased to 3/10.  Patient does not warrant admission at this time.  He is alert, oriented, and ambulating without assistance.  Patient is hemodynamically stable.  Polysubstance abuse: Reviewed rapid urine drug screen.  Positive for cocaine, benzodiazepines, cannabinoids, and opiates.  Patient's urine was positive  for cocaine on 04/29/2020 as well. Patient left abruptly prior to counseling about illicit substances. At this juncture, patient warrants drug rehabilitation.  Will notify patient by phone with resources pertaining to drug rehabilitation.  No opiates will be prescribed by this clinic going forward.  Patient is aware of the dangers of cocaine abuse with chronic opiate use.  Chronic kidney disease stage IV: Patient was evaluated by his hematologist on 06/30/2020.  It  was found that creatinine level was elevated at 3.5.  Today, creatinine level was 3.0 on admission.  Creatinine level improved to 2.7 following hydration.  Patient advised to follow-up with nephrologist as scheduled.  It appears that renal functioning has been worsening over the past several months.  Patient has poorly controlled sickle cell disease which may be a contributing factor to this problem.  However, he will warrant close follow-up with his nephrology team.  Sickle cell anemia: Today, patient's hemoglobin is 7.2, which is slightly decreased from his baseline.  He did not warrant any blood transfusions on today.  He is typically not transfused unless his hemoglobin falls below 6.0 g/dL.  Patient is currently on both hydroxyurea and Oxbryta per his hematology team.  Will defer to his hematology team for any further work-up and evaluations.   Physical Exam at Discharge:  BP 118/78 (BP Location: Left Arm)   Pulse 97   Temp 97.9 F (36.6 C) (Oral)   Resp 10   SpO2 92%   Physical Exam Constitutional:      Appearance: Normal appearance.  Eyes:     Pupils: Pupils are equal, round, and reactive to light.  Cardiovascular:     Rate and Rhythm: Normal rate and regular rhythm.     Heart sounds: Murmur heard.    Pulmonary:     Effort: Pulmonary effort is normal.  Abdominal:     General: Abdomen is flat. Bowel sounds are normal.  Musculoskeletal:        General: Normal range of motion.  Skin:    General: Skin is warm.  Neurological:     General: No focal deficit present.     Mental Status: He is alert. Mental status is at baseline.  Psychiatric:        Mood and Affect: Mood normal.        Behavior: Behavior normal.        Thought Content: Thought content normal.        Judgment: Judgment normal.   Discharge summary: Resume all home medications.   Follow up with PCP as previously  scheduled.  We will continue to follow patient in primary care.  We will assist patient with  locating drug rehabilitation services.  No further opiates will be prescribed at this time.  Discussed the importance of drinking 64 ounces of water daily, dehydration of red blood cells may lead further sickling.   Avoid all stressors that precipitate sickle cell pain crisis.     The patient was given clear instructions to go to ER or return to medical center if symptoms do not improve, worsen or new problems develop.       Disposition at Discharge: Discharge disposition: 01-Home or Self Care       Discharge Orders:   Condition at Discharge:   Stable  Time spent on Discharge:  Greater than 30 minutes.  Signed: [name]  APRN, MSN, FNP-C [facility] [address] [place], [place] [address] [phone]  07/03/2020, 7:54 AM

## 2020-07-06 ENCOUNTER — Other Ambulatory Visit: Payer: Self-pay | Admitting: Family Medicine

## 2020-07-06 ENCOUNTER — Telehealth: Payer: Self-pay

## 2020-07-06 DIAGNOSIS — G894 Chronic pain syndrome: Secondary | ICD-10-CM

## 2020-07-06 DIAGNOSIS — N184 Chronic kidney disease, stage 4 (severe): Secondary | ICD-10-CM

## 2020-07-06 NOTE — Progress Notes (Addendum)
Patient coming in for urine drug screen and blood work (CBC, CMP)

## 2020-07-09 ENCOUNTER — Other Ambulatory Visit: Payer: Medicare Other

## 2020-07-13 ENCOUNTER — Encounter (HOSPITAL_COMMUNITY): Payer: Medicare Other

## 2020-07-14 ENCOUNTER — Other Ambulatory Visit: Payer: Self-pay | Admitting: Family Medicine

## 2020-07-14 ENCOUNTER — Other Ambulatory Visit: Payer: Self-pay

## 2020-07-14 ENCOUNTER — Telehealth: Payer: Self-pay

## 2020-07-14 ENCOUNTER — Other Ambulatory Visit: Payer: Self-pay | Admitting: Internal Medicine

## 2020-07-14 ENCOUNTER — Other Ambulatory Visit: Payer: Medicare Other

## 2020-07-14 ENCOUNTER — Other Ambulatory Visit (HOSPITAL_COMMUNITY): Payer: Self-pay

## 2020-07-14 DIAGNOSIS — G894 Chronic pain syndrome: Secondary | ICD-10-CM

## 2020-07-14 DIAGNOSIS — N184 Chronic kidney disease, stage 4 (severe): Secondary | ICD-10-CM

## 2020-07-14 DIAGNOSIS — D5700 Hb-SS disease with crisis, unspecified: Secondary | ICD-10-CM

## 2020-07-14 MED ORDER — OXYCODONE HCL 10 MG PO TABS
10.0000 mg | ORAL_TABLET | Freq: Four times a day (QID) | ORAL | 0 refills | Status: AC | PRN
  Filled 2020-07-14: qty 60, 15d supply, fill #0

## 2020-07-14 NOTE — Addendum Note (Signed)
Addended by: Thressa Sheller on: 07/14/2020 09:21 AM   Modules accepted: Orders

## 2020-07-14 NOTE — Telephone Encounter (Signed)
Oxycodone 10 Oxycodone 15

## 2020-07-14 NOTE — Addendum Note (Signed)
Addended by: Thressa Sheller on: 07/14/2020 09:55 AM   Modules accepted: Orders

## 2020-07-14 NOTE — Addendum Note (Signed)
Addended by: Thressa Sheller on: 07/14/2020 09:51 AM   Modules accepted: Orders

## 2020-07-15 ENCOUNTER — Other Ambulatory Visit (HOSPITAL_COMMUNITY): Payer: Self-pay

## 2020-07-15 LAB — COMPREHENSIVE METABOLIC PANEL
ALT: 25 IU/L (ref 0–44)
AST: 40 IU/L (ref 0–40)
Albumin/Globulin Ratio: 1.3 (ref 1.2–2.2)
Albumin: 3.5 g/dL — ABNORMAL LOW (ref 4.0–5.0)
Alkaline Phosphatase: 290 IU/L — ABNORMAL HIGH (ref 44–121)
BUN/Creatinine Ratio: 11 (ref 9–20)
BUN: 35 mg/dL — ABNORMAL HIGH (ref 6–20)
Bilirubin Total: 0.5 mg/dL (ref 0.0–1.2)
CO2: 16 mmol/L — ABNORMAL LOW (ref 20–29)
Calcium: 8 mg/dL — ABNORMAL LOW (ref 8.7–10.2)
Chloride: 104 mmol/L (ref 96–106)
Creatinine, Ser: 3.15 mg/dL — ABNORMAL HIGH (ref 0.76–1.27)
Globulin, Total: 2.6 g/dL (ref 1.5–4.5)
Glucose: 74 mg/dL (ref 65–99)
Potassium: 5.7 mmol/L — ABNORMAL HIGH (ref 3.5–5.2)
Sodium: 135 mmol/L (ref 134–144)
Total Protein: 6.1 g/dL (ref 6.0–8.5)
eGFR: 25 mL/min/{1.73_m2} — ABNORMAL LOW (ref >59)

## 2020-07-15 LAB — CBC WITH DIFFERENTIAL/PLATELET
Basophils Absolute: 0 10*3/uL (ref 0.0–0.2)
Basos: 0 %
EOS (ABSOLUTE): 0.3 10*3/uL (ref 0.0–0.4)
Eos: 2 %
Hematocrit: 23.6 % — ABNORMAL LOW (ref 37.5–51.0)
Hemoglobin: 8.6 g/dL — ABNORMAL LOW (ref 13.0–17.7)
Immature Grans (Abs): 0 10*3/uL (ref 0.0–0.1)
Immature Granulocytes: 0 %
Lymphocytes Absolute: 6.2 10*3/uL — ABNORMAL HIGH (ref 0.7–3.1)
Lymphs: 42 %
MCH: 42.6 pg — ABNORMAL HIGH (ref 26.6–33.0)
MCHC: 36.4 g/dL — ABNORMAL HIGH (ref 31.5–35.7)
MCV: 117 fL — ABNORMAL HIGH (ref 79–97)
Monocytes Absolute: 1.2 10*3/uL — ABNORMAL HIGH (ref 0.1–0.9)
Monocytes: 8 %
Neutrophils Absolute: 7 10*3/uL (ref 1.4–7.0)
Neutrophils: 48 %
Platelets: 339 10*3/uL (ref 150–450)
RBC: 2.02 x10E6/uL — CL (ref 4.14–5.80)
RDW: 19.5 % — ABNORMAL HIGH (ref 11.6–15.4)
WBC: 14.8 10*3/uL — ABNORMAL HIGH (ref 3.4–10.8)

## 2020-07-19 NOTE — Telephone Encounter (Signed)
error 

## 2020-07-22 DIAGNOSIS — G894 Chronic pain syndrome: Secondary | ICD-10-CM | POA: Diagnosis not present

## 2020-07-22 DIAGNOSIS — G8929 Other chronic pain: Secondary | ICD-10-CM | POA: Diagnosis not present

## 2020-07-22 DIAGNOSIS — M1711 Unilateral primary osteoarthritis, right knee: Secondary | ICD-10-CM | POA: Diagnosis not present

## 2020-07-22 DIAGNOSIS — M25561 Pain in right knee: Secondary | ICD-10-CM | POA: Diagnosis not present

## 2020-07-22 DIAGNOSIS — D571 Sickle-cell disease without crisis: Secondary | ICD-10-CM | POA: Diagnosis not present

## 2020-07-28 ENCOUNTER — Other Ambulatory Visit: Payer: Self-pay | Admitting: Family Medicine

## 2020-07-28 ENCOUNTER — Other Ambulatory Visit (HOSPITAL_COMMUNITY): Payer: Self-pay

## 2020-07-28 ENCOUNTER — Telehealth: Payer: Self-pay

## 2020-07-28 DIAGNOSIS — G894 Chronic pain syndrome: Secondary | ICD-10-CM

## 2020-07-28 NOTE — Telephone Encounter (Signed)
Oxycontin 15 mg

## 2020-07-29 ENCOUNTER — Telehealth: Payer: Self-pay

## 2020-07-29 ENCOUNTER — Other Ambulatory Visit: Payer: Self-pay | Admitting: Internal Medicine

## 2020-07-29 ENCOUNTER — Other Ambulatory Visit (HOSPITAL_COMMUNITY): Payer: Self-pay

## 2020-07-29 DIAGNOSIS — G894 Chronic pain syndrome: Secondary | ICD-10-CM

## 2020-07-29 DIAGNOSIS — D5700 Hb-SS disease with crisis, unspecified: Secondary | ICD-10-CM

## 2020-07-29 NOTE — Telephone Encounter (Signed)
Requested medication (s) are due for refill today - no  Requested medication (s) are on the active medication list -yes  Future visit scheduled -no  Last refill: 07/15/20  Notes to clinic: Duplicate request- refuse yesterday- no longer under prescriber care  Requested Prescriptions  Pending Prescriptions Disp Refills   Oxycodone HCl 10 MG TABS 60 tablet 0    Sig: Take 1 tablet (10 mg total) by mouth every 6 (six) hours as needed for up to 15 days for pain.      There is no refill protocol information for this order        Requested Prescriptions  Pending Prescriptions Disp Refills   Oxycodone HCl 10 MG TABS 60 tablet 0    Sig: Take 1 tablet (10 mg total) by mouth every 6 (six) hours as needed for up to 15 days for pain.      There is no refill protocol information for this order

## 2020-07-29 NOTE — Telephone Encounter (Signed)
Oxycodone 15 mg 

## 2020-07-30 ENCOUNTER — Inpatient Hospital Stay (HOSPITAL_COMMUNITY)
Admission: EM | Admit: 2020-07-30 | Discharge: 2020-08-03 | DRG: 812 | Disposition: A | Discharge status: Home Health | Payer: Medicare Other | Attending: Internal Medicine | Admitting: Internal Medicine

## 2020-07-30 ENCOUNTER — Other Ambulatory Visit: Payer: Self-pay | Admitting: Internal Medicine

## 2020-07-30 ENCOUNTER — Telehealth: Payer: Self-pay | Admitting: Family Medicine

## 2020-07-30 ENCOUNTER — Other Ambulatory Visit: Payer: Self-pay

## 2020-07-30 ENCOUNTER — Emergency Department (HOSPITAL_COMMUNITY): Payer: Medicare Other

## 2020-07-30 ENCOUNTER — Encounter (HOSPITAL_COMMUNITY): Payer: Self-pay

## 2020-07-30 ENCOUNTER — Other Ambulatory Visit (HOSPITAL_COMMUNITY): Payer: Self-pay

## 2020-07-30 DIAGNOSIS — E875 Hyperkalemia: Secondary | ICD-10-CM | POA: Diagnosis present

## 2020-07-30 DIAGNOSIS — D5700 Hb-SS disease with crisis, unspecified: Secondary | ICD-10-CM

## 2020-07-30 DIAGNOSIS — N179 Acute kidney failure, unspecified: Secondary | ICD-10-CM | POA: Diagnosis not present

## 2020-07-30 DIAGNOSIS — D72828 Other elevated white blood cell count: Secondary | ICD-10-CM | POA: Diagnosis present

## 2020-07-30 DIAGNOSIS — Z885 Allergy status to narcotic agent status: Secondary | ICD-10-CM

## 2020-07-30 DIAGNOSIS — Z79899 Other long term (current) drug therapy: Secondary | ICD-10-CM | POA: Diagnosis not present

## 2020-07-30 DIAGNOSIS — D649 Anemia, unspecified: Secondary | ICD-10-CM | POA: Diagnosis not present

## 2020-07-30 DIAGNOSIS — Z8701 Personal history of pneumonia (recurrent): Secondary | ICD-10-CM

## 2020-07-30 DIAGNOSIS — G894 Chronic pain syndrome: Secondary | ICD-10-CM | POA: Diagnosis present

## 2020-07-30 DIAGNOSIS — R918 Other nonspecific abnormal finding of lung field: Secondary | ICD-10-CM | POA: Diagnosis not present

## 2020-07-30 DIAGNOSIS — N184 Chronic kidney disease, stage 4 (severe): Secondary | ICD-10-CM | POA: Diagnosis present

## 2020-07-30 DIAGNOSIS — R079 Chest pain, unspecified: Secondary | ICD-10-CM | POA: Diagnosis not present

## 2020-07-30 DIAGNOSIS — N189 Chronic kidney disease, unspecified: Secondary | ICD-10-CM | POA: Diagnosis present

## 2020-07-30 DIAGNOSIS — E876 Hypokalemia: Secondary | ICD-10-CM | POA: Diagnosis present

## 2020-07-30 DIAGNOSIS — Z803 Family history of malignant neoplasm of breast: Secondary | ICD-10-CM

## 2020-07-30 DIAGNOSIS — K219 Gastro-esophageal reflux disease without esophagitis: Secondary | ICD-10-CM | POA: Diagnosis present

## 2020-07-30 DIAGNOSIS — Z9049 Acquired absence of other specified parts of digestive tract: Secondary | ICD-10-CM

## 2020-07-30 DIAGNOSIS — F17200 Nicotine dependence, unspecified, uncomplicated: Secondary | ICD-10-CM | POA: Diagnosis present

## 2020-07-30 DIAGNOSIS — F1410 Cocaine abuse, uncomplicated: Secondary | ICD-10-CM | POA: Diagnosis present

## 2020-07-30 DIAGNOSIS — D638 Anemia in other chronic diseases classified elsewhere: Secondary | ICD-10-CM | POA: Diagnosis present

## 2020-07-30 DIAGNOSIS — Z20822 Contact with and (suspected) exposure to covid-19: Secondary | ICD-10-CM | POA: Diagnosis present

## 2020-07-30 DIAGNOSIS — Z886 Allergy status to analgesic agent status: Secondary | ICD-10-CM | POA: Diagnosis not present

## 2020-07-30 DIAGNOSIS — Z87891 Personal history of nicotine dependence: Secondary | ICD-10-CM | POA: Diagnosis not present

## 2020-07-30 LAB — CBC WITH DIFFERENTIAL/PLATELET
Abs Immature Granulocytes: 0.11 10*3/uL — ABNORMAL HIGH (ref 0.00–0.07)
Basophils Absolute: 0 10*3/uL (ref 0.0–0.1)
Basophils Relative: 0 %
Eosinophils Absolute: 0.4 10*3/uL (ref 0.0–0.5)
Eosinophils Relative: 2 %
HCT: 17.3 % — ABNORMAL LOW (ref 39.0–52.0)
Hemoglobin: 6 g/dL — CL (ref 13.0–17.0)
Immature Granulocytes: 1 %
Lymphocytes Relative: 42 %
Lymphs Abs: 7 10*3/uL — ABNORMAL HIGH (ref 0.7–4.0)
MCH: 41.7 pg — ABNORMAL HIGH (ref 26.0–34.0)
MCHC: 34.7 g/dL (ref 30.0–36.0)
MCV: 120.1 fL — ABNORMAL HIGH (ref 80.0–100.0)
Monocytes Absolute: 1.3 10*3/uL — ABNORMAL HIGH (ref 0.1–1.0)
Monocytes Relative: 8 %
Neutro Abs: 7.8 10*3/uL — ABNORMAL HIGH (ref 1.7–7.7)
Neutrophils Relative %: 47 %
Platelets: 180 10*3/uL (ref 150–400)
RBC: 1.44 MIL/uL — ABNORMAL LOW (ref 4.22–5.81)
RDW: 25.2 % — ABNORMAL HIGH (ref 11.5–15.5)
WBC: 16.7 10*3/uL — ABNORMAL HIGH (ref 4.0–10.5)
nRBC: 0.7 % — ABNORMAL HIGH (ref 0.0–0.2)

## 2020-07-30 LAB — RESP PANEL BY RT-PCR (FLU A&B, COVID) ARPGX2
Influenza A by PCR: NEGATIVE
Influenza B by PCR: NEGATIVE
SARS Coronavirus 2 by RT PCR: NEGATIVE

## 2020-07-30 LAB — COMPREHENSIVE METABOLIC PANEL
ALT: 31 U/L (ref 0–44)
AST: 49 U/L — ABNORMAL HIGH (ref 15–41)
Albumin: 2.9 g/dL — ABNORMAL LOW (ref 3.5–5.0)
Alkaline Phosphatase: 309 U/L — ABNORMAL HIGH (ref 38–126)
Anion gap: 7 (ref 5–15)
BUN: 36 mg/dL — ABNORMAL HIGH (ref 6–20)
CO2: 20 mmol/L — ABNORMAL LOW (ref 22–32)
Calcium: 8.5 mg/dL — ABNORMAL LOW (ref 8.9–10.3)
Chloride: 109 mmol/L (ref 98–111)
Creatinine, Ser: 3.25 mg/dL — ABNORMAL HIGH (ref 0.61–1.24)
GFR, Estimated: 24 mL/min — ABNORMAL LOW (ref >60)
Glucose, Bld: 97 mg/dL (ref 70–99)
Potassium: 5.6 mmol/L — ABNORMAL HIGH (ref 3.5–5.1)
Sodium: 136 mmol/L (ref 135–145)
Total Bilirubin: 1.4 mg/dL — ABNORMAL HIGH (ref 0.3–1.2)
Total Protein: 6.9 g/dL (ref 6.5–8.1)

## 2020-07-30 LAB — DRUG SCREEN 764883 11+OXYCO+ALC+CRT-BUND
Amphetamines, Urine: NEGATIVE ng/mL
BENZODIAZ UR QL: NEGATIVE ng/mL
Barbiturate: NEGATIVE ng/mL
Cocaine (Metabolite): NEGATIVE ng/mL
Creatinine: 91.6 mg/dL (ref 20.0–300.0)
Ethanol: NEGATIVE %
Meperidine: NEGATIVE ng/mL
Methadone Screen, Urine: NEGATIVE ng/mL
OPIATE SCREEN URINE: NEGATIVE ng/mL
Phencyclidine: NEGATIVE ng/mL
Propoxyphene: NEGATIVE ng/mL
Tramadol: NEGATIVE ng/mL
pH, Urine: 6.6 (ref 4.5–8.9)

## 2020-07-30 LAB — RETICULOCYTES
Immature Retic Fract: 37.1 % — ABNORMAL HIGH (ref 2.3–15.9)
RBC.: 1.43 MIL/uL — ABNORMAL LOW (ref 4.22–5.81)
Retic Count, Absolute: 148.9 10*3/uL (ref 19.0–186.0)
Retic Ct Pct: 10.4 % — ABNORMAL HIGH (ref 0.4–3.1)

## 2020-07-30 LAB — PREPARE RBC (CROSSMATCH)

## 2020-07-30 LAB — CANNABINOID CONFIRMATION, UR
CANNABINOIDS: POSITIVE — AB
Carboxy THC GC/MS Conf: 233 ng/mL

## 2020-07-30 LAB — OXYCODONE/OXYMORPHONE, CONFIRM: OXYCODONE/OXYMORPH: NEGATIVE

## 2020-07-30 MED ORDER — SODIUM ZIRCONIUM CYCLOSILICATE 5 G PO PACK: 5.0000 g | PACK | Freq: Once | ORAL | Status: DC

## 2020-07-30 MED ORDER — ONDANSETRON HCL 4 MG/2ML IJ SOLN: 4.0000 mg | Freq: Four times a day (QID) | INTRAMUSCULAR | Status: DC | PRN

## 2020-07-30 MED ORDER — SODIUM CHLORIDE 0.9% FLUSH: 9.0000 mL | INTRAVENOUS | Status: DC | PRN

## 2020-07-30 MED ORDER — SODIUM ZIRCONIUM CYCLOSILICATE 10 G PO PACK
10.0000 g | PACK | ORAL | Status: DC
  Administered 2020-07-30 – 2020-08-01 (×2): 10 g via ORAL
  Filled 2020-07-30 (×2): qty 1

## 2020-07-30 MED ORDER — OXYCODONE HCL ER 15 MG PO T12A
15.0000 mg | EXTENDED_RELEASE_TABLET | Freq: Two times a day (BID) | ORAL | Status: DC
  Administered 2020-07-30 – 2020-08-03 (×8): 15 mg via ORAL
  Filled 2020-07-30 (×8): qty 1

## 2020-07-30 MED ORDER — OXYCODONE HCL 5 MG PO TABS
15.0000 mg | ORAL_TABLET | Freq: Once | ORAL | Status: AC
  Administered 2020-07-30: 15 mg via ORAL
  Filled 2020-07-30: qty 3

## 2020-07-30 MED ORDER — HEPARIN SODIUM (PORCINE) 5000 UNIT/ML IJ SOLN
5000.0000 [IU] | Freq: Three times a day (TID) | INTRAMUSCULAR | Status: DC
  Administered 2020-07-30 – 2020-07-31 (×4): 5000 [IU] via SUBCUTANEOUS
  Filled 2020-07-30 (×6): qty 1

## 2020-07-30 MED ORDER — SODIUM BICARBONATE 650 MG PO TABS
1950.0000 mg | ORAL_TABLET | Freq: Three times a day (TID) | ORAL | Status: DC
  Administered 2020-07-30 – 2020-08-03 (×11): 1950 mg via ORAL
  Filled 2020-07-30 (×11): qty 3

## 2020-07-30 MED ORDER — NORTRIPTYLINE HCL 25 MG PO CAPS
25.0000 mg | ORAL_CAPSULE | Freq: Every day | ORAL | Status: DC
  Administered 2020-07-30 – 2020-08-02 (×4): 25 mg via ORAL
  Filled 2020-07-30 (×4): qty 1

## 2020-07-30 MED ORDER — DEXTROSE-NACL 5-0.45 % IV SOLN: INTRAVENOUS | Status: DC

## 2020-07-30 MED ORDER — NALOXONE HCL 0.4 MG/ML IJ SOLN: 0.4000 mg | INTRAMUSCULAR | Status: DC | PRN

## 2020-07-30 MED ORDER — DIPHENHYDRAMINE HCL 25 MG PO CAPS: 25.0000 mg | ORAL_CAPSULE | ORAL | Status: DC | PRN

## 2020-07-30 MED ORDER — HYDROMORPHONE 1 MG/ML IV SOLN
INTRAVENOUS | Status: DC
  Administered 2020-07-30: 30 mg via INTRAVENOUS
  Administered 2020-07-31: 6 mg via INTRAVENOUS
  Administered 2020-07-31: 7 mg via INTRAVENOUS
  Administered 2020-07-31: 3.5 mg via INTRAVENOUS
  Administered 2020-07-31: 5 mg via INTRAVENOUS
  Administered 2020-07-31: 2.5 mg via INTRAVENOUS
  Administered 2020-07-31: 30 mg via INTRAVENOUS
  Administered 2020-07-31: 3 mg via INTRAVENOUS
  Administered 2020-08-01: 5 mg via INTRAVENOUS
  Administered 2020-08-01: 8 mg via INTRAVENOUS
  Administered 2020-08-01: 30 mg via INTRAVENOUS
  Administered 2020-08-01: 5.5 mg via INTRAVENOUS
  Administered 2020-08-01: 4 mg via INTRAVENOUS
  Administered 2020-08-02: 0.5 mg via INTRAVENOUS
  Administered 2020-08-02 (×2): 5.5 mg via INTRAVENOUS
  Administered 2020-08-02: 1 mg via INTRAVENOUS
  Filled 2020-07-30 (×3): qty 30

## 2020-07-30 MED ORDER — SODIUM CHLORIDE 0.9 % IV SOLN
25.0000 mg | INTRAVENOUS | Status: DC | PRN
  Administered 2020-07-31 – 2020-08-01 (×2): 25 mg via INTRAVENOUS
  Filled 2020-07-30: qty 0.5
  Filled 2020-07-30: qty 25
  Filled 2020-07-30: qty 0.5
  Filled 2020-07-30: qty 25

## 2020-07-30 MED ORDER — NICOTINE 21 MG/24HR TD PT24
21.0000 mg | MEDICATED_PATCH | Freq: Every day | TRANSDERMAL | Status: DC
  Administered 2020-07-30: 21 mg via TRANSDERMAL
  Filled 2020-07-30 (×2): qty 1

## 2020-07-30 MED ORDER — HYDROMORPHONE HCL 2 MG/ML IJ SOLN
2.0000 mg | INTRAMUSCULAR | Status: AC
  Administered 2020-07-30: 2 mg via SUBCUTANEOUS
  Filled 2020-07-30: qty 1

## 2020-07-30 MED ORDER — ONDANSETRON HCL 4 MG/2ML IJ SOLN
4.0000 mg | Freq: Once | INTRAMUSCULAR | Status: AC
  Administered 2020-07-30: 4 mg via INTRAVENOUS
  Filled 2020-07-30: qty 2

## 2020-07-30 MED ORDER — SODIUM CHLORIDE 0.45 % IV SOLN: INTRAVENOUS | Status: DC

## 2020-07-30 MED ORDER — DIPHENHYDRAMINE HCL 25 MG PO CAPS
25.0000 mg | ORAL_CAPSULE | ORAL | Status: DC | PRN
  Administered 2020-07-30 – 2020-08-01 (×3): 50 mg via ORAL
  Filled 2020-07-30: qty 1
  Filled 2020-07-30 (×3): qty 2

## 2020-07-30 MED ORDER — FOLIC ACID 1 MG PO TABS
1.0000 mg | ORAL_TABLET | Freq: Every day | ORAL | Status: DC
  Administered 2020-07-30 – 2020-08-03 (×5): 1 mg via ORAL
  Filled 2020-07-30 (×5): qty 1

## 2020-07-30 MED ORDER — ACETAMINOPHEN 325 MG PO TABS: 650.0000 mg | ORAL_TABLET | Freq: Three times a day (TID) | ORAL | Status: DC | PRN

## 2020-07-30 MED ORDER — VOXELOTOR 500 MG PO TABS: 1500.0000 mg | ORAL_TABLET | Freq: Every day | ORAL | Status: DC

## 2020-07-30 MED ORDER — SODIUM CHLORIDE 0.9% IV SOLUTION: Freq: Once | INTRAVENOUS | Status: DC

## 2020-07-30 MED ORDER — ONDANSETRON HCL 4 MG/2ML IJ SOLN: 4.0000 mg | Freq: Once | INTRAMUSCULAR | Status: DC

## 2020-07-30 MED ORDER — ALLOPURINOL 100 MG PO TABS
100.0000 mg | ORAL_TABLET | Freq: Every day | ORAL | Status: DC
  Administered 2020-07-31 – 2020-08-03 (×4): 100 mg via ORAL
  Filled 2020-07-30 (×4): qty 1

## 2020-07-30 MED ORDER — SENNOSIDES-DOCUSATE SODIUM 8.6-50 MG PO TABS
1.0000 | ORAL_TABLET | Freq: Two times a day (BID) | ORAL | Status: DC
  Administered 2020-07-30 – 2020-07-31 (×2): 1 via ORAL
  Filled 2020-07-30 (×4): qty 1

## 2020-07-30 MED ORDER — DEFERIPRONE (TWICE DAILY) 1000 MG PO TABS: 1000.0000 mg | ORAL_TABLET | Freq: Two times a day (BID) | ORAL | Status: DC

## 2020-07-30 MED ORDER — HYDROMORPHONE HCL 2 MG/ML IJ SOLN
2.0000 mg | INTRAMUSCULAR | Status: AC | PRN
Start: 2020-07-30 — End: 2020-08-01
  Administered 2020-08-01: 2 mg via SUBCUTANEOUS
  Filled 2020-07-30: qty 1

## 2020-07-30 MED ORDER — SODIUM CHLORIDE 0.9 % IV SOLN: 10.0000 mL/h | Freq: Once | INTRAVENOUS | Status: DC

## 2020-07-30 MED ORDER — POLYETHYLENE GLYCOL 3350 17 G PO PACK: 17.0000 g | PACK | Freq: Every day | ORAL | Status: DC | PRN

## 2020-07-30 MED ORDER — SODIUM CHLORIDE 0.9 % IV BOLUS
500.0000 mL | Freq: Once | INTRAVENOUS | Status: AC
  Administered 2020-07-30: 500 mL via INTRAVENOUS

## 2020-07-30 MED ORDER — HYDROXYUREA 500 MG PO CAPS
500.0000 mg | ORAL_CAPSULE | Freq: Every day | ORAL | Status: DC
  Administered 2020-07-31 – 2020-08-03 (×4): 500 mg via ORAL
  Filled 2020-07-30 (×4): qty 1
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
## 2020-07-30 NOTE — ED Notes (Signed)
Contacted Care team [place] from [facility] at [phone] for phone consult to patient at 7:12 pm

## 2020-07-30 NOTE — ED Notes (Signed)
[facility] 6.0. Eulis Foster, MD notified.

## 2020-07-30 NOTE — Telephone Encounter (Signed)
Patient states [place] has to send his Oxycodone '15mg'$  to the pharmacy because he is locked in with [place], not Dr. Doreene Burke. It is due to be filled today.

## 2020-07-30 NOTE — ED Triage Notes (Signed)
Pt arrived via walk in, c/o SCC since yesterday. States pain throughout entire body.

## 2020-07-30 NOTE — H&P (Signed)
History and Physical   [name] [name] [employer]. ID:[id number] DOB: 11/26/1980 DOA: 07/30/2020  Referring MD/NP/PA: Dr. Eulis Foster  PCP: [name], MD   Outpatient Specialists: None  Patient coming from: Home  Chief Complaint: Pain all over sickle cell type  HPI: [name] [name]. is [name] 40 y.o. male with medical history significant of sickle cell disease, cocaine abuse, chronic kidney disease stage IV, GERD, tobacco abuse, chronic osteomyelitis of the right knee with medication noncompliance who presents to the ER with sickle cell painful crisis.  Patient has been off his narcotics for almost 4 weeks except for the oxycodone which he ran out [name] few days ago.  He has consistently had cocaine in his system so he has been cut off these medications.  He came to the ER with [name] complaint of worsening pain in his back and legs.  He has been taking just Tylenol in the last few days.  Denied any fever or chills denied any nausea vomiting or diarrhea.  Patient came to the ER has been given Dilaudid 2 mg every 30 minutes x3 but still having significant pain.  Also noted to have hemoglobin drop to 6.0.  He usually has hemoglobin around 7.  For the reason patient is being admitted to the hospital with sickle cell painful and hemolytic crisis.  He is tachycardic otherwise no fever or chills..  ED Course: Temperature 97.7, blood pressure one 142/89, pulse 121 respirate of 29 oxygen sat 95% on room air.  White count 16.7 hemoglobin 6.0 and platelets 180.  Sodium 136 potassium 5.6 chloride 109 CO2 20 BUN 36 creatinine of 3.25 and calcium 8.5.  Glucose is 97.  Chest x-ray showed no acute findings.  Patient being admitted with acute on chronic kidney failure with sickle cell crisis.  Review of Systems: As per HPI otherwise 10 point review of systems negative.    Past Medical History:  Diagnosis Date   Abscess of right iliac fossa 09/24/2018   required Perc Drain 09/24/2018   Arachnoid Cyst of brain  bilaterally    "2 really small ones in the back of my head; inside; saw them w/MRI" (09/25/2012)   Bacterial pneumonia ~ 2012   "caught it here in the hospital" (09/25/2012)   Chronic kidney disease    "from my sickle cell" (09/25/2012)   CKD (chronic kidney disease), stage II    Colitis 04/19/2017   CT scan abd/ pelvis   GERD (gastroesophageal reflux disease)    "after I eat alot of spicey foods" (09/25/2012)   Gynecomastia, male 07/10/2012   History of blood transfusion    "always related to sickle cell crisis" (09/25/2012)   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   Noted in noted from Hematology notes at [facility]    "take RX qd to prevent them" (09/25/2012)   Opioid dependence with withdrawal ([place]) 08/30/2012   Renal insufficiency    Sickle cell anemia (HCC)    Sickle cell crisis ([place]) 09/25/2012   Sickle cell nephropathy ([place]) 07/10/2012   Sinus tachycardia    Tachycardia with heart rate 121-140 beats per minute with ambulation 08/04/2016    Past Surgical History:  Procedure Laterality Date   ABCESS DRAINAGE     CHOLECYSTECTOMY  ~ 2012   COLONOSCOPY N/[name] 04/23/2017   Procedure: COLONOSCOPY;  Surgeon: Irene Shipper, MD;  Location: [facility];  Service: Endoscopy;  Laterality: N/[name];   IR FLUORO GUIDE CV LINE RIGHT  12/17/2016   IR RADIOLOGIST  EVAL & MGMT  10/02/2018   IR RADIOLOGIST EVAL & MGMT  10/15/2018   IR REMOVAL TUN CV CATH W/O FL  12/21/2016   IR US GUIDE VASC ACCESS RIGHT  12/17/2016   spleenectomy       reports that he has been smoking cigarettes. He has been smoking an average of 0.10 packs per day. He has never used smokeless tobacco. He reports current alcohol use. He reports current drug use. Drug: Marijuana.  Allergies  Allergen Reactions   Nsaids Other (See Comments)    Kidney disease   Morphine And Related Other (See Comments)    "Real bad headaches"     Family History  Problem Relation Age of Onset   Breast cancer Mother      Prior  to Admission medications   Medication Sig Start Date End Date Taking? Authorizing Provider  acetaminophen (TYLENOL) 650 MG CR tablet Take 650 mg by mouth every 8 (eight) hours as needed for pain.   Yes [provider]  allopurinol (ZYLOPRIM) 100 MG tablet Take 100 mg by mouth daily. 02/12/20  Yes [provider]  Deferiprone, Twice Daily, (FERRIPROX TWICE-[name]-DAY) 1000 MG TABS Take 1,000 mg by mouth 2 (two) times daily.   Yes [provider]  folic acid (FOLVITE) 1 MG tablet TAKE 1 TABLET BY MOUTH EVERY DAY Patient taking differently: Take 1 mg by mouth daily. 03/22/20  Yes [name], FNP  hydroxyurea (HYDREA) 500 MG capsule Take 500 mg by mouth daily. 05/07/20  Yes [provider]  LOKELMA 10 g PACK packet Take 10 g by mouth every other day. 03/18/20  Yes [provider]  nortriptyline (PAMELOR) 25 MG capsule TAKE 1 CAPSULE BY MOUTH TWICE [name] DAY Patient taking differently: Take 25 mg by mouth daily. 06/14/20  Yes [name], FNP  OXBRYTA 500 MG TABS tablet Take 1,500 mg by mouth daily. 03/26/20  Yes [provider]  sodium bicarbonate 650 MG tablet Take 1,950 mg by mouth 3 (three) times daily. 11/07/18  Yes [provider]  Cholecalciferol (VITAMIN D3) 50 MCG (2000 UT) TABS Take 1 each by mouth daily. Patient not taking: Reported on 07/30/2020 01/13/20   [name], FNP  oxyCODONE (OXYCONTIN) 15 mg 12 hr tablet Take 1 tablet (15 mg total) by mouth every 12 (twelve) hours. 06/26/20   [name], FNP  Oxycodone HCl 10 MG TABS Take 10 mg by mouth 2 (two) times daily.    [provider]    Physical Exam: Vitals:   07/30/20 1713 07/30/20 1730 07/30/20 1800 07/30/20 1830  BP: (!) 138/94 (!) 136/96 (!) 133/93 (!) 119/105  Pulse: 97 97    Resp: '17 15 11 17  '$ Temp:      TempSrc:      SpO2: 97% 99%        Constitutional: Acutely ill looking in mild distress Vitals:   07/30/20 1713 07/30/20 1730 07/30/20  1800 07/30/20 1830  BP: (!) 138/94 (!) 136/96 (!) 133/93 (!) 119/105  Pulse: 97 97    Resp: '17 15 11 17  '$ Temp:      TempSrc:      SpO2: 97% 99%     Eyes: PERRL, lids and conjunctivae normal ENMT: Mucous membranes are dry. Posterior pharynx clear of any exudate or lesions.Normal dentition.  Neck: normal, supple, no masses, no thyromegaly Respiratory: clear to auscultation bilaterally, no wheezing, no crackles. Normal respiratory effort. No accessory muscle use.  Cardiovascular: Regular rate and  rhythm, no murmurs / rubs / gallops. No extremity edema. 2+ pedal pulses. No carotid bruits.  Abdomen: no tenderness, no masses palpated. No hepatosplenomegaly. Bowel sounds positive.  Musculoskeletal: no clubbing / cyanosis. No joint deformity upper and lower extremities. Good ROM, no contractures. Normal muscle tone.  Skin: no rashes, lesions, ulcers. No induration Neurologic: CN 2-12 grossly intact. Sensation intact, DTR normal. Strength 5/5 in all 4.  Psychiatric: Normal judgment and insight. Alert and oriented x 3. Normal mood.     Labs on Admission: I have personally reviewed following labs and imaging studies  CBC: Recent Labs  Lab 07/30/20 1427  WBC 16.7*  NEUTROABS 7.8*  HGB 6.0*  HCT 17.3*  MCV 120.1*  PLT 99991111   Basic Metabolic Panel: Recent Labs  Lab 07/30/20 1427  NA 136  K 5.6*  CL 109  CO2 20*  GLUCOSE 97  BUN 36*  CREATININE 3.25*  CALCIUM 8.5*   GFR: CrCl cannot be calculated (Unknown ideal weight.). Liver Function Tests: Recent Labs  Lab 07/30/20 1427  AST 49*  ALT 31  ALKPHOS 309*  BILITOT 1.4*  PROT 6.9  ALBUMIN 2.9*   No results for input(s): LIPASE, AMYLASE in the last 168 hours. No results for input(s): AMMONIA in the last 168 hours. Coagulation Profile: No results for input(s): INR, PROTIME in the last 168 hours. Cardiac Enzymes: No results for input(s): CKTOTAL, CKMB, CKMBINDEX, TROPONINI in the last 168 hours. BNP (last 3 results) No  results for input(s): PROBNP in the last 8760 hours. HbA1C: No results for input(s): HGBA1C in the last 72 hours. CBG: No results for input(s): GLUCAP in the last 168 hours. Lipid Profile: No results for input(s): CHOL, HDL, LDLCALC, TRIG, CHOLHDL, LDLDIRECT in the last 72 hours. Thyroid Function Tests: No results for input(s): TSH, T4TOTAL, FREET4, T3FREE, THYROIDAB in the last 72 hours. Anemia Panel: Recent Labs    07/30/20 1427  RETICCTPCT 10.4*   Urine analysis:    Component Value Date/Time   COLORURINE YELLOW 07/01/2020 1138   APPEARANCEUR CLEAR 07/01/2020 1138   LABSPEC 1.008 07/01/2020 1138   PHURINE 8.0 07/01/2020 1138   GLUCOSEU 150 ([name]) 07/01/2020 1138   HGBUR SMALL ([name]) 07/01/2020 1138   HGBUR small 03/10/2010 1445   BILIRUBINUR NEGATIVE 07/01/2020 1138   BILIRUBINUR neg 06/15/2020 1515   KETONESUR NEGATIVE 07/01/2020 1138   PROTEINUR 100 ([name]) 07/01/2020 1138   UROBILINOGEN 0.2 06/15/2020 1515   UROBILINOGEN 0.2 05/11/2017 1335   NITRITE NEGATIVE 07/01/2020 1138   LEUKOCYTESUR NEGATIVE 07/01/2020 1138   Sepsis Labs: '@LABRCNTIP'$ (procalcitonin:4,lacticidven:4) ) Recent Results (from the past 240 hour(s))  Resp Panel by RT-PCR (Flu [name]&B, Covid) Nasopharyngeal Swab     Status: None   Collection Time: 07/30/20  2:39 PM   Specimen: Nasopharyngeal Swab; Nasopharyngeal(NP) swabs in vial transport medium  Result Value Ref Range Status   SARS Coronavirus 2 by RT PCR NEGATIVE NEGATIVE Final    Comment: (NOTE) SARS-CoV-2 target nucleic acids are NOT DETECTED.  The SARS-CoV-2 RNA is generally detectable in upper respiratory specimens during the acute phase of infection. The lowest concentration of SARS-CoV-2 viral copies this assay can detect is 138 copies/mL. [name] negative result does not preclude SARS-Cov-2 infection and should not be used as the sole basis for treatment or other patient management decisions. [name] negative result may occur with  improper specimen  collection/handling, submission of specimen other than nasopharyngeal swab, presence of viral mutation(s) within the areas targeted by this assay, and inadequate number of viral  copies(<138 copies/mL). [name] negative result must be combined with clinical observations, patient history, and epidemiological information. The expected result is Negative.  Fact Sheet for Patients:  EntrepreneurPulse.com.au  Fact Sheet for Healthcare Providers:  IncredibleEmployment.be  This test is no t yet approved or cleared by the [place] FDA and  has been authorized for detection and/or diagnosis of SARS-CoV-2 by FDA under an Emergency Use Authorization (EUA). This EUA will remain  in effect (meaning this test can be used) for the duration of the COVID-19 declaration under Section 564(b)(1) of the Act, 21 U.S.C.section 360bbb-3(b)(1), unless the authorization is terminated  or revoked sooner.       Influenza [name] by PCR NEGATIVE NEGATIVE Final   Influenza B by PCR NEGATIVE NEGATIVE Final    Comment: (NOTE) The Xpert Xpress SARS-CoV-2/FLU/RSV plus assay is intended as an aid in the diagnosis of influenza from Nasopharyngeal swab specimens and should not be used as [name] sole basis for treatment. Nasal washings and aspirates are unacceptable for Xpert Xpress SARS-CoV-2/FLU/RSV testing.  Fact Sheet for Patients: EntrepreneurPulse.com.au  Fact Sheet for Healthcare Providers: IncredibleEmployment.be  This test is not yet approved or cleared by the [place] FDA and has been authorized for detection and/or diagnosis of SARS-CoV-2 by FDA under an Emergency Use Authorization (EUA). This EUA will remain in effect (meaning this test can be used) for the duration of the COVID-19 declaration under Section 564(b)(1) of the Act, 21 U.S.C. section 360bbb-3(b)(1), unless the authorization is terminated or revoked.  Performed at [facility], [place] [address], [place], [place] [address]      Radiological Exams on Admission: DG Chest 2 View  Result Date: 07/30/2020 CLINICAL DATA:  Sickle cell pain crisis EXAM: CHEST - 2 VIEW COMPARISON:  09/30/2019 FINDINGS: Stable cardiomediastinal contours. Mildly coarsened interstitial markings bilaterally. Streaky left basilar opacity. No pleural effusion or pneumothorax. 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 IMPRESSION: Streaky left basilar opacity may reflect atelectasis or developing infiltrate. Electronically Signed   By: Davina Poke D.O.   On: 07/30/2020 17:21    EKG: Independently reviewed.  Sinus tachycardia otherwise no significant findings  Assessment/Plan Principal Problem:   Sickle cell anemia with crisis (HCC) Active Problems:   TOBACCO ABUSE   Chronic kidney disease (CKD), stage IV (severe) (HCC)   Cocaine abuse (HCC)   Hyperkalemia   Acute kidney injury superimposed on chronic kidney disease (HCC)   GERD (gastroesophageal reflux disease)   Chronic pain syndrome     #1 sickle cell anemia with crisis: Patient will be admitted to monitored bed.  Initiate Dilaudid PCA.  No Toradol.  Has not been on long-acting oxycodone but will add it now with the Dilaudid PCA since he is in crisis.  Frequent pain assessment and adjustment of medications.  We will still give some D5 half-normal [place] point due to renal failure.  #2 symptomatic anemia: Secondary to hemolytic crisis.  Transfuse 1 unit of packed red blood cells  #3 acute on chronic kidney disease stage IV: Avoid nephrotoxic medications.  Gentle hydration  #4 cocaine abuse: Check urine drug screen.  Patient has been cut off from his chronic pain medicine due to cocaine abuse.  #5 GERD: Continue PPIs  #6 hypokalemia: Secondary to chronic kidney disease stage IV.  Patient has received Lokelma in the ER.  #7 chronic pain syndrome: Off  long-acting pain medications for [name] few weeks.  May need to be off of that  long-term if pain is controlled without it.  #8 tobacco abuse: Counseling provided.  Add nicotine patch.   DVT prophylaxis: Heparin subcutaneously Code Status: Full code Family Communication: No family at bedside Disposition Plan: Home Consults called: None Admission status: Inpatient  Severity of Illness: The appropriate patient status for this patient is INPATIENT. Inpatient status is judged to be reasonable and necessary in order to provide the required intensity of service to ensure the patient's safety. The patient's presenting symptoms, physical exam findings, and initial radiographic and laboratory data in the context of their chronic comorbidities is felt to place them at high risk for further clinical deterioration. Furthermore, it is not anticipated that the patient will be medically stable for discharge from the hospital within 2 midnights of admission. The following factors support the patient status of inpatient.   " The patient's presenting symptoms include pain in backs and legs. " The worrisome physical exam findings include dry mucous membrane. " The initial radiographic and laboratory data are worrisome because of hemoglobin 6.0. " The chronic co-morbidities include sickle cell disease.   * I certify that at the point of admission it is my clinical judgment that the patient will require inpatient hospital care spanning beyond 2 midnights from the point of admission due to high intensity of service, high risk for further deterioration and high frequency of surveillance required.Barbette Merino MD [facility] Pager [phone]  If 7PM-7AM, please contact night-coverage [email or web address] Password [facility]  07/30/2020, 7:27 PM

## 2020-07-30 NOTE — Telephone Encounter (Signed)
  Patient [name]   [name] is a 40 year old male with a medical history significant for sickle cell disease, chronic kidney disease stage IV, chronic pain syndrome, opiate dependence and tolerance, and history of polysubstance abuse notified office with concerns about pain medications.  Patient has made several requests to his pharmacy for oxycodone.  This provider is unable to write prescription opiates for this patient going forward.  On 07/01/2020 urine drug screen was reviewed, positive for cocaine, benzodiazepines, tetrahydrocannabinols, and opiates. Today, patient says that he does not know how those substances got into his system.Three months prior, UDS was also positive for illicit substances.  Three months prior, patient was counseled at length on the dangers of illicit drug use especially in the setting of sickle cell disease and stage IV chronic kidney disease.  Patient expressed understanding at that time.  Afterwards, he had 2 consecutive negative drug screens and we were able to restart [name] pain medications.  At that time, it was discussed with patient that if he tested positive for illicit substances again that this provider would no longer be able to write prescription opiates.  Patient expressed understanding.  Drug rehabilitation was offered, patient declined. No opiate medication prescriptions will be sent by this provider at this time.  He will need to follow up with medical director and Engineer, building services.     [name] Pounds  APRN, MSN, FNP-C [facility] [address] [place], [place] [address] [phone]

## 2020-07-31 DIAGNOSIS — D5700 Hb-SS disease with crisis, unspecified: Secondary | ICD-10-CM | POA: Diagnosis not present

## 2020-07-31 LAB — COMPREHENSIVE METABOLIC PANEL
ALT: 32 U/L (ref 0–44)
AST: 44 U/L — ABNORMAL HIGH (ref 15–41)
Albumin: 2.8 g/dL — ABNORMAL LOW (ref 3.5–5.0)
Alkaline Phosphatase: 283 U/L — ABNORMAL HIGH (ref 38–126)
Anion gap: 7 (ref 5–15)
BUN: 30 mg/dL — ABNORMAL HIGH (ref 6–20)
CO2: 18 mmol/L — ABNORMAL LOW (ref 22–32)
Calcium: 8.5 mg/dL — ABNORMAL LOW (ref 8.9–10.3)
Chloride: 107 mmol/L (ref 98–111)
Creatinine, Ser: 2.96 mg/dL — ABNORMAL HIGH (ref 0.61–1.24)
GFR, Estimated: 27 mL/min — ABNORMAL LOW (ref >60)
Glucose, Bld: 104 mg/dL — ABNORMAL HIGH (ref 70–99)
Potassium: 4.3 mmol/L (ref 3.5–5.1)
Sodium: 132 mmol/L — ABNORMAL LOW (ref 135–145)
Total Bilirubin: 1.1 mg/dL (ref 0.3–1.2)
Total Protein: 6.4 g/dL — ABNORMAL LOW (ref 6.5–8.1)

## 2020-07-31 LAB — CBC WITH DIFFERENTIAL/PLATELET
Abs Immature Granulocytes: 0.13 10*3/uL — ABNORMAL HIGH (ref 0.00–0.07)
Basophils Absolute: 0 10*3/uL (ref 0.0–0.1)
Basophils Relative: 0 %
Eosinophils Absolute: 0.4 10*3/uL (ref 0.0–0.5)
Eosinophils Relative: 2 %
HCT: 20.5 % — ABNORMAL LOW (ref 39.0–52.0)
Hemoglobin: 7.1 g/dL — ABNORMAL LOW (ref 13.0–17.0)
Immature Granulocytes: 1 %
Lymphocytes Relative: 47 %
Lymphs Abs: 8 10*3/uL — ABNORMAL HIGH (ref 0.7–4.0)
MCH: 36.8 pg — ABNORMAL HIGH (ref 26.0–34.0)
MCHC: 34.6 g/dL (ref 30.0–36.0)
MCV: 106.2 fL — ABNORMAL HIGH (ref 80.0–100.0)
Monocytes Absolute: 1.4 10*3/uL — ABNORMAL HIGH (ref 0.1–1.0)
Monocytes Relative: 8 %
Neutro Abs: 7.2 10*3/uL (ref 1.7–7.7)
Neutrophils Relative %: 42 %
Platelets: 172 10*3/uL (ref 150–400)
RBC: 1.93 MIL/uL — ABNORMAL LOW (ref 4.22–5.81)
RDW: 28.4 % — ABNORMAL HIGH (ref 11.5–15.5)
WBC: 17.1 10*3/uL — ABNORMAL HIGH (ref 4.0–10.5)
nRBC: 0.8 % — ABNORMAL HIGH (ref 0.0–0.2)

## 2020-07-31 IMAGING — MR MR FOOT*L* W/O CM
4 of 6 series · 19 of 40 positions shown · non-contrast
Comparison: Radiographs 11/20/2018 and 11/02/2018.

CLINICAL DATA: Chronic foot pain for 6-12 months. Occasional
swelling. No acute injury or prior relevant surgery. History of
sickle cell anemia.

EXAM:
MRI OF THE LEFT FOOT WITHOUT CONTRAST
TECHNIQUE: Multiplanar, multisequence MR imaging of the left forefoot was
performed. No intravenous contrast was administered.

[Series 4: T1 · coronal · 3.0mm · 0.20mm/px · 3 of 44 slices shown (1 of 2)]
[im 7/44]
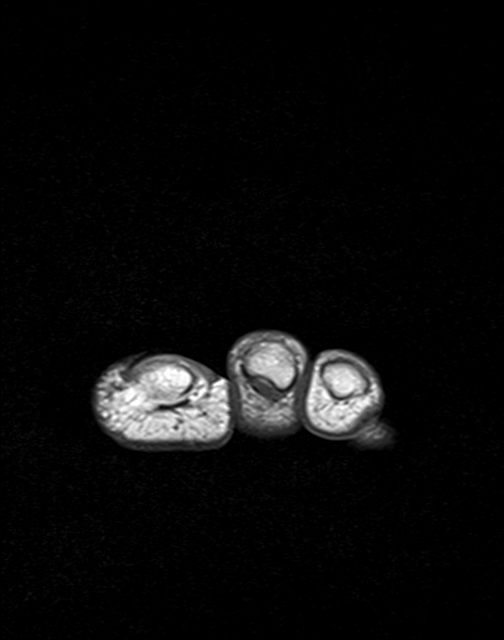
[im 25/44]
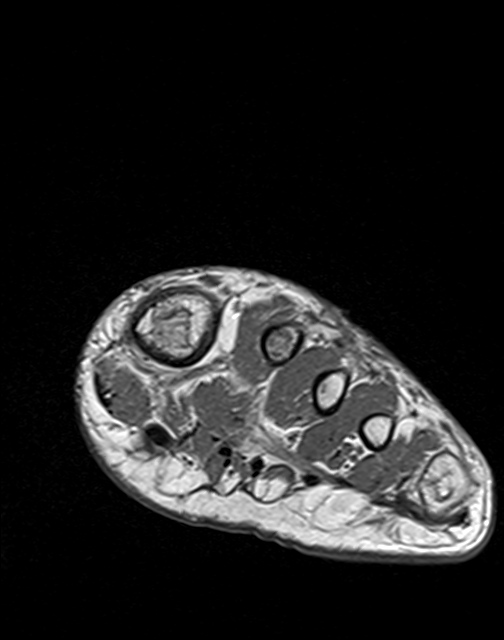
[im 37/44]
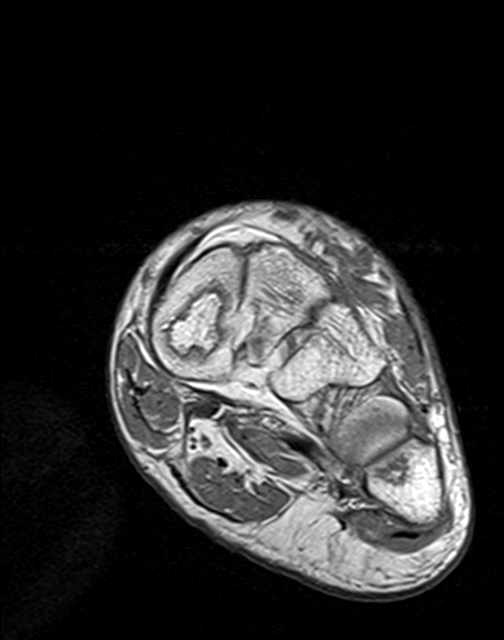

[Series 5: T2 fat-sat · coronal · 3.0mm · 0.20mm/px · 9 of 44 slices shown (1 of 2)]
[im 1/44]
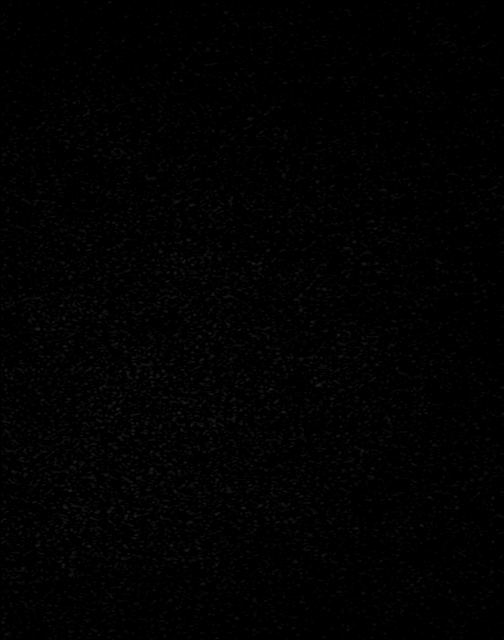
[im 6/44]
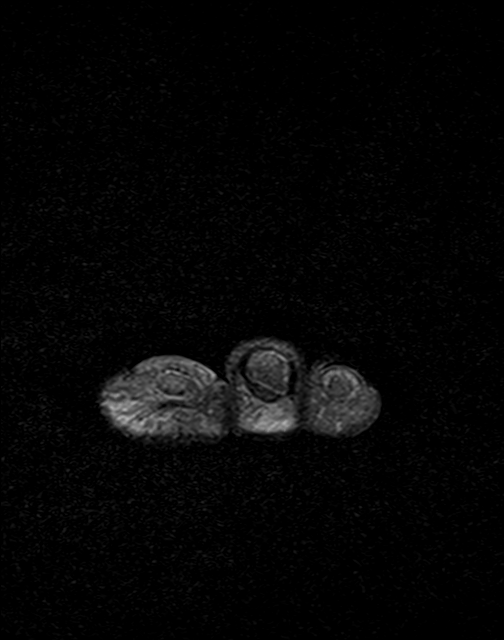
[im 11/44]
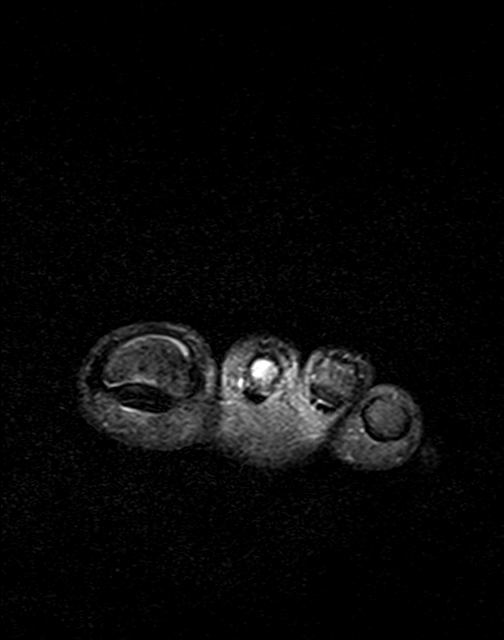
[im 17/44]
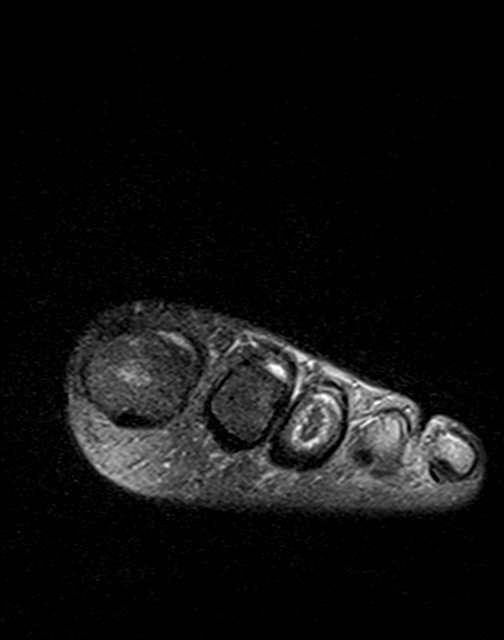
[im 22/44]
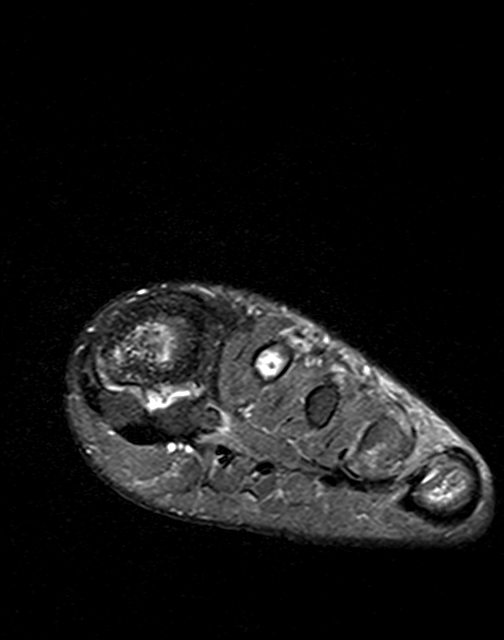
[im 27/44]
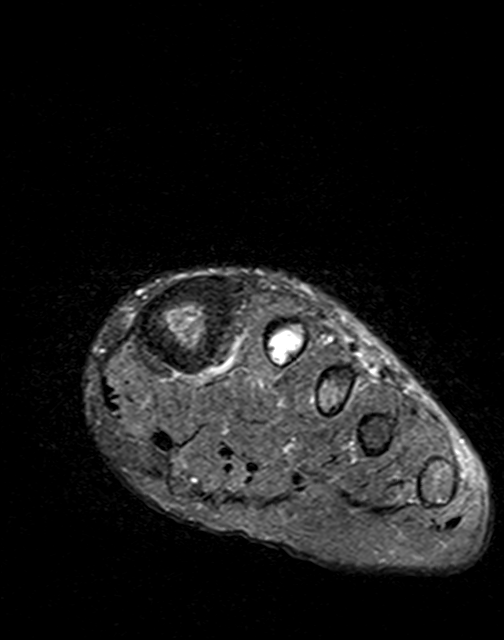
[im 33/44]
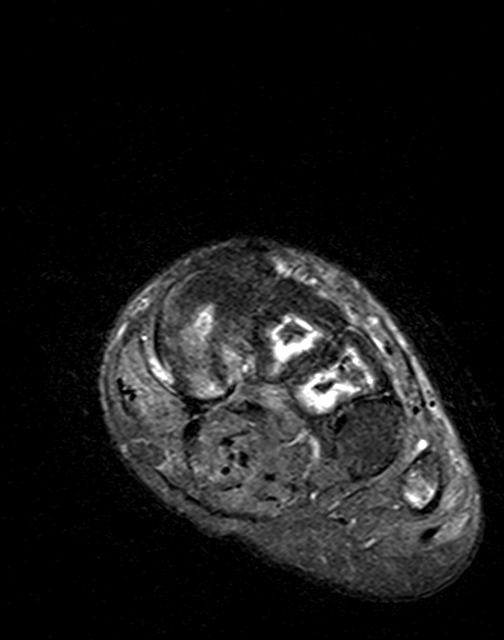
[im 38/44]
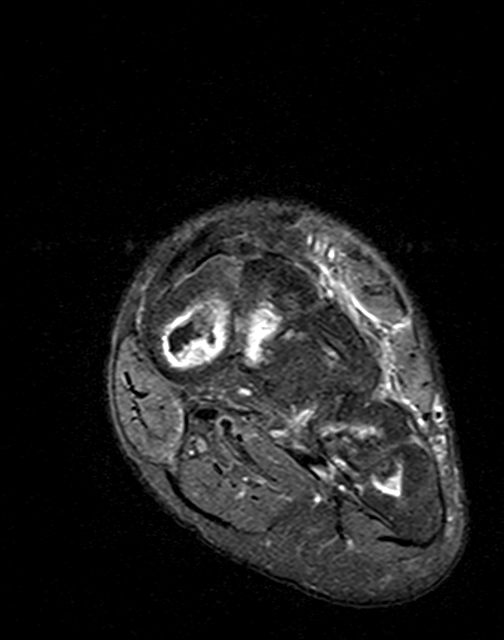
[im 44/44]
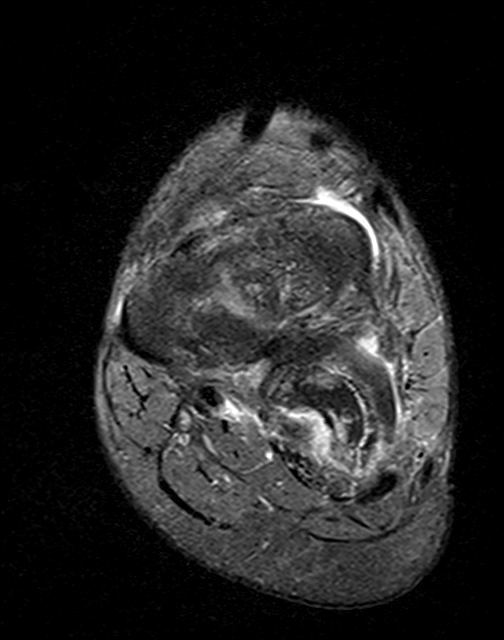

[Series 6: T2 fat-sat · axial · 3.0mm · 0.35mm/px · z∈[-69,+6]mm · 4 of 21 slices shown (2 of 2)]
[im 1/21]
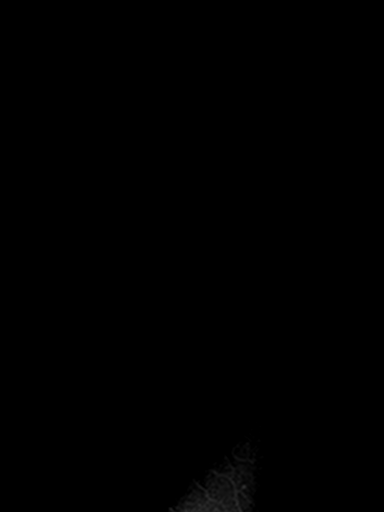
[im 7/21]
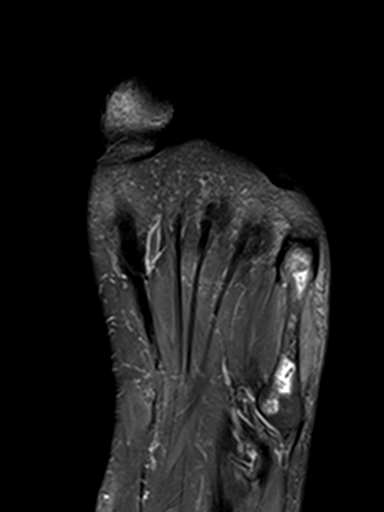
[im 14/21]
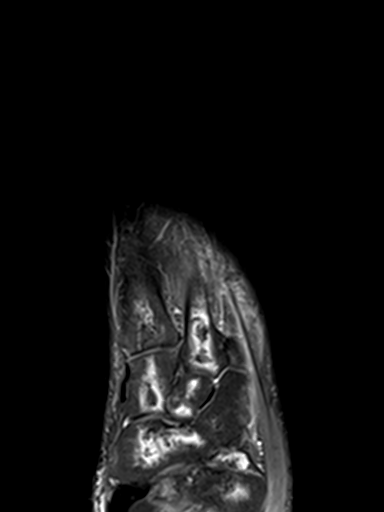
[im 21/21]
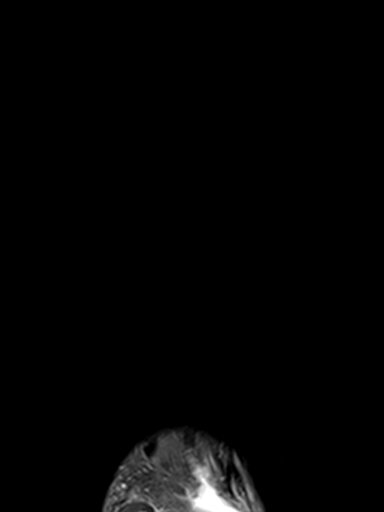

[Series 7: T1 · axial · 3.0mm · 0.35mm/px · z∈[-71,+7]mm · 3 of 22 slices shown (2 of 2)]
[im 1/22]
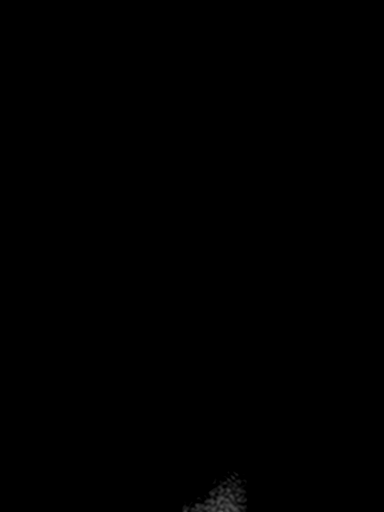
[im 15/22]
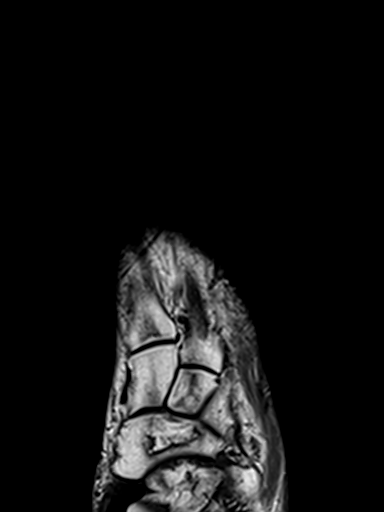
[im 22/22]
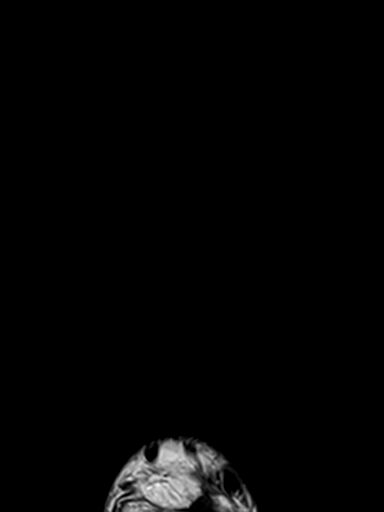

[19 of 40 positions shown; findings below may reference images not displayed]

FINDINGS: Bones/Joint/Cartilage

Numerous medullary bone infarcts are noted. As correlated with the
prior radiographs, there is chronic sclerosis and fragmentation of
the 1st metatarsal head with secondary degenerative changes at the
1st metatarsophalangeal joint. These findings do not appear
significantly progressive. The bone infarcts involve all of the
metatarsals, including the heads of the 3rd through 5th metatarsals.
There are bone infarcts in the digits of the 1st through 3rd toes.
Additionally, bone infarcts are present within all of the visualized
tarsal bones as well as the distal tibia. These infarcts are
associated with curvilinear T2 hyperintensity, as well as
surrounding bone marrow edema, especially in the shafts of the 2nd
and 3rd metatarsals.

Ligaments

Intact Lisfranc ligament.

Muscles and Tendons

The forefoot tendons appear intact without significant
tenosynovitis. No significant muscular findings.

Soft tissues

Allowing for incomplete fat saturation in the distal forefoot on the
T2 weighted images, no soft tissue edema or focal fluid collection.
IMPRESSION: 1. Multifocal bone infarcts throughout the visualized tarsal bones,
distal tibia, metatarsals and toes attributed to the patient's
sickle cell anemia. There is chronic subchondral collapse of the 1st
metatarsal head with secondary degenerative changes at the 1st MTP
joint.
2. No evidence of acute fracture, dislocation or osteomyelitis.
3. The forefoot tendons and ligaments appear normal.

## 2020-07-31 NOTE — Progress Notes (Signed)
Subjective: Patient admitted yesterday with sickle cell painful crisis.  He has had significant drop in hemoglobin to 6.0.  He is status post transfusion 1 unit of packed red blood cells.  Hemoglobin has gone to 7.1.  Pain in his back still persist.  Described as 8 out of 10.  Objective: Vital signs in last 24 hours: Temp:  [97.6 F (36.4 C)-98.1 F (36.7 C)] 98 F (36.7 C) (06/18 0315) Pulse Rate:  [57-121] 86 (06/18 0315) Resp:  [11-29] 18 (06/18 0422) BP: (119-142)/(76-105) 132/93 (06/18 0315) SpO2:  [93 %-100 %] 95 % (06/18 0422) Weight change:  Last BM Date: 07/29/20  Intake/Output from previous day: 06/17 0701 - 06/18 0700 In: 376 [Blood:376] Out: 1200 [Urine:1200] Intake/Output this shift: No intake/output data recorded.  General appearance: alert, cooperative, appears stated age, and no distress Neck: no adenopathy, no carotid bruit, no JVD, supple, symmetrical, trachea midline, and thyroid not enlarged, symmetric, no tenderness/mass/nodules Resp: clear to auscultation bilaterally Cardio: regular rate and rhythm, S1, S2 normal, no murmur, click, rub or gallop GI: soft, non-tender; bowel sounds normal; no masses,  no organomegaly Extremities: extremities normal, atraumatic, no cyanosis or edema Skin: Skin color, texture, turgor normal. No rashes or lesions Neurologic: Grossly normal  Lab Results: Recent Labs    07/30/20 1427 07/31/20 0600  WBC 16.7* 17.1*  HGB 6.0* 7.1*  HCT 17.3* 20.5*  PLT 180 172   BMET Recent Labs    07/30/20 1427  NA 136  K 5.6*  CL 109  CO2 20*  GLUCOSE 97  BUN 36*  CREATININE 3.25*  CALCIUM 8.5*    Studies/Results: DG Chest 2 View  Result Date: 07/30/2020 CLINICAL DATA:  Sickle cell pain crisis EXAM: CHEST - 2 VIEW COMPARISON:  09/30/2019 FINDINGS: Stable cardiomediastinal contours. Mildly coarsened interstitial markings bilaterally. Streaky left basilar opacity. No pleural effusion or pneumothorax. Chronic changes of avascular  necrosis of the bilateral humeral heads, right worse than left. IMPRESSION: Streaky left basilar opacity may reflect atelectasis or developing infiltrate. Electronically Signed   By: Davina Poke D.O.   On: 07/30/2020 17:21    Medications: I have reviewed the patient's current medications.  Assessment/Plan: A 40 year old gentleman admitted with sickle cell painful crisis.  #1 sickle cell painful crisis: Patient will be maintained on Dilaudid PCA, IV fluids, oral pain medications.  She cannot get Toradol due to renal function.  We will monitor closely.  Adjust medications.  #2 acute on chronic kidney disease stage IV: Renal function improving with hydration.  #3 hyperkalemia: Given some Lokelma.  Potassium bath to normal.  #4 leukocytosis: Secondary to vaso-occlusive disease.  Continue to monitor white count  #5 polysubstance abuse: Check urine drug screen.  Has been using cocaine in the past.   LOS: 1 day   [name] [name],[name] [name] 07/31/2020, 7:07 AM

## 2020-08-01 DIAGNOSIS — D5700 Hb-SS disease with crisis, unspecified: Secondary | ICD-10-CM | POA: Diagnosis not present

## 2020-08-01 LAB — BPAM RBC
Blood Product Expiration Date: 202207132359
ISSUE DATE / TIME: 202206172345
Unit Type and Rh: 5100

## 2020-08-01 LAB — TYPE AND SCREEN
ABO/RH(D): O POS
Antibody Screen: NEGATIVE
Unit division: 0

## 2020-08-01 LAB — CBC
HCT: 20.8 % — ABNORMAL LOW (ref 39.0–52.0)
Hemoglobin: 7.3 g/dL — ABNORMAL LOW (ref 13.0–17.0)
MCH: 36.9 pg — ABNORMAL HIGH (ref 26.0–34.0)
MCHC: 35.1 g/dL (ref 30.0–36.0)
MCV: 105.1 fL — ABNORMAL HIGH (ref 80.0–100.0)
Platelets: 162 10*3/uL (ref 150–400)
RBC: 1.98 MIL/uL — ABNORMAL LOW (ref 4.22–5.81)
RDW: 29.1 % — ABNORMAL HIGH (ref 11.5–15.5)
WBC: 16.6 10*3/uL — ABNORMAL HIGH (ref 4.0–10.5)
nRBC: 0.9 % — ABNORMAL HIGH (ref 0.0–0.2)

## 2020-08-01 NOTE — Progress Notes (Signed)
Subjective: Patient still complaining of 8 out of 10 pains in his legs.  He has been trying to walk but finding it difficult.  His hemoglobin has improved to 7.3.  No fever or chills no nausea vomiting or diarrhea  Objective: Vital signs in last 24 hours: Temp:  [97.8 F (36.6 C)-98.6 F (37 C)] 98.3 F (36.8 C) (06/19 2013) Pulse Rate:  [88-108] 101 (06/19 2013) Resp:  [10-18] 15 (06/19 2152) BP: (109-122)/(71-85) 122/71 (06/19 2013) SpO2:  [93 %-98 %] 97 % (06/19 2152) Weight:  [54.6 kg] 54.6 kg (06/19 1803) Weight change:  Last BM Date: 07/30/20  Intake/Output from previous day: 06/18 0701 - 06/19 0700 In: 2772.2 [P.O.:600; I.V.:2172.2] Out: 2450 [Urine:2450] Intake/Output this shift: No intake/output data recorded.  General appearance: alert, cooperative, appears stated age, and no distress Neck: no adenopathy, no carotid bruit, no JVD, supple, symmetrical, trachea midline, and thyroid not enlarged, symmetric, no tenderness/mass/nodules Resp: clear to auscultation bilaterally Cardio: regular rate and rhythm, S1, S2 normal, no murmur, click, rub or gallop GI: soft, non-tender; bowel sounds normal; no masses,  no organomegaly Extremities: extremities normal, atraumatic, no cyanosis or edema Skin: Skin color, texture, turgor normal. No rashes or lesions Neurologic: Grossly normal  Lab Results: Recent Labs    07/31/20 0600 08/01/20 0851  WBC 17.1* 16.6*  HGB 7.1* 7.3*  HCT 20.5* 20.8*  PLT 172 162    BMET Recent Labs    07/30/20 1427 07/31/20 0600  NA 136 132*  K 5.6* 4.3  CL 109 107  CO2 20* 18*  GLUCOSE 97 104*  BUN 36* 30*  CREATININE 3.25* 2.96*  CALCIUM 8.5* 8.5*     Studies/Results: No results found.  Medications: I have reviewed the patient's current medications.  Assessment/Plan: A 40 year old gentleman admitted with sickle cell painful crisis.  #1 sickle cell painful crisis: Patient has slowly improved so he will be will be maintained on  Dilaudid PCA, IV fluids, oral pain medications.  He cannot get Toradol due to renal function.  We will monitor closely.  Adjust medications.  #2 acute on chronic kidney disease stage IV: Renal function improving with hydration.  Continue to monitor his BUN/creatinine  #3 hyperkalemia: Resolved.  We will DC Lokelma   #4 leukocytosis: Secondary to vaso-occlusive disease.  Continue to monitor white count  #5 polysubstance abuse: Check urine drug screen.  This was ordered in the ER but still not done.  Has been using cocaine in the past.   LOS: 2 days   Nyaisha Simao,LAWAL 08/01/2020, 11:11 PM

## 2020-08-01 NOTE — Progress Notes (Signed)
Initial Nutrition Assessment RD working remotely.   DOCUMENTATION CODES:   Not applicable  INTERVENTION:  - weigh patient today.   NUTRITION DIAGNOSIS:   Inadequate oral intake related to acute illness as evidenced by per patient/family report.  GOAL:   Patient will meet greater than or equal to 90% of their needs  MONITOR:   PO intake, Labs, Weight trends  REASON FOR ASSESSMENT:   Malnutrition Screening Tool  ASSESSMENT:   40 y.o. male with medical history of sickle cell disease, cocaine use, stage IV CKD, GERD, tobacco abuse, chronic osteomyelitis of R knee, and medication noncompliance. He presented to the ED with sickle cell pain crisis.  He has been eating 100% at meals over the past 2 days. He has not been weighed since 06/15/20 at which time weight was up from weights over the previous 7 months. No information documented in the edema section of flow sheet.   Per notes: - sickle cell anemia with crisis - symptomatic anemia - acute on stage 4 CKD    Labs reviewed; Na: 132 mmol/l, BUN: 30 mg/dl, creatinine: 2.96 mg/dl, Ca: 8.5 mg/dl, Alk Phos elevated, GFR: 27 ml/min.  Medications reviewed; 1 mg folvite/day, 1 tablet senokot BID, 1950 mg sodium bicarb TID, 5 g lokelma x1 dose 6/17. IVF; D51/2 NS @ 100 ml/hr (408 kcal/24 hrs).    NUTRITION - FOCUSED PHYSICAL EXAM:  Unable to complete at this time.  Diet Order:   Diet Order             Diet regular Room service appropriate? Yes  Diet effective now                   EDUCATION NEEDS:   No education needs have been identified at this time  Skin:  Skin Assessment: Reviewed RN Assessment  Last BM:  6/17  Height:   Ht Readings from Last 1 Encounters:  06/15/20 '5\' 3"'  (1.6 m)    Weight:   Wt Readings from Last 1 Encounters:  06/15/20 56.7 kg     Estimated Nutritional Needs:  Kcal:  1600-1800 kcal Protein:  75-90 grams Fluid:  >/= 1.7 L/day       Jarome Matin, MS, RD, LDN,  CNSC Inpatient Clinical Dietitian RD pager # available in [facility]  After hours/weekend pager # available in [facility]
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
## 2020-08-02 DIAGNOSIS — D5700 Hb-SS disease with crisis, unspecified: Principal | ICD-10-CM

## 2020-08-02 LAB — CBC WITH DIFFERENTIAL/PLATELET
Abs Immature Granulocytes: 0.08 10*3/uL — ABNORMAL HIGH (ref 0.00–0.07)
Basophils Absolute: 0 10*3/uL (ref 0.0–0.1)
Basophils Relative: 0 %
Eosinophils Absolute: 0.5 10*3/uL (ref 0.0–0.5)
Eosinophils Relative: 4 %
HCT: 19.5 % — ABNORMAL LOW (ref 39.0–52.0)
Hemoglobin: 6.8 g/dL — CL (ref 13.0–17.0)
Immature Granulocytes: 1 %
Lymphocytes Relative: 46 %
Lymphs Abs: 5.9 10*3/uL — ABNORMAL HIGH (ref 0.7–4.0)
MCH: 36.8 pg — ABNORMAL HIGH (ref 26.0–34.0)
MCHC: 34.9 g/dL (ref 30.0–36.0)
MCV: 105.4 fL — ABNORMAL HIGH (ref 80.0–100.0)
Monocytes Absolute: 1 10*3/uL (ref 0.1–1.0)
Monocytes Relative: 8 %
Neutro Abs: 5.2 10*3/uL (ref 1.7–7.7)
Neutrophils Relative %: 41 %
Platelets: 163 10*3/uL (ref 150–400)
RBC: 1.85 MIL/uL — ABNORMAL LOW (ref 4.22–5.81)
RDW: 29.4 % — ABNORMAL HIGH (ref 11.5–15.5)
WBC: 12.7 10*3/uL — ABNORMAL HIGH (ref 4.0–10.5)
nRBC: 1.1 % — ABNORMAL HIGH (ref 0.0–0.2)

## 2020-08-02 LAB — COMPREHENSIVE METABOLIC PANEL
ALT: 28 U/L (ref 0–44)
AST: 44 U/L — ABNORMAL HIGH (ref 15–41)
Albumin: 2.5 g/dL — ABNORMAL LOW (ref 3.5–5.0)
Alkaline Phosphatase: 263 U/L — ABNORMAL HIGH (ref 38–126)
Anion gap: 6 (ref 5–15)
BUN: 22 mg/dL — ABNORMAL HIGH (ref 6–20)
CO2: 21 mmol/L — ABNORMAL LOW (ref 22–32)
Calcium: 8.1 mg/dL — ABNORMAL LOW (ref 8.9–10.3)
Chloride: 104 mmol/L (ref 98–111)
Creatinine, Ser: 2.47 mg/dL — ABNORMAL HIGH (ref 0.61–1.24)
GFR, Estimated: 34 mL/min — ABNORMAL LOW (ref >60)
Glucose, Bld: 87 mg/dL (ref 70–99)
Potassium: 4.4 mmol/L (ref 3.5–5.1)
Sodium: 131 mmol/L — ABNORMAL LOW (ref 135–145)
Total Bilirubin: 1.2 mg/dL (ref 0.3–1.2)
Total Protein: 5.8 g/dL — ABNORMAL LOW (ref 6.5–8.1)

## 2020-08-02 MED ORDER — HYDROMORPHONE 1 MG/ML IV SOLN
INTRAVENOUS | Status: DC
  Administered 2020-08-02: 3 mg via INTRAVENOUS
  Administered 2020-08-02: 3.5 mg via INTRAVENOUS
  Administered 2020-08-03 (×2): 0 mg via INTRAVENOUS
  Administered 2020-08-03: 0.5 mg via INTRAVENOUS
  Administered 2020-08-03: 1.5 mg via INTRAVENOUS

## 2020-08-02 MED ORDER — OXYCODONE HCL 5 MG PO TABS
10.0000 mg | ORAL_TABLET | Freq: Four times a day (QID) | ORAL | Status: DC | PRN
  Administered 2020-08-02: 10 mg via ORAL
  Filled 2020-08-02: qty 2

## 2020-08-02 MED ORDER — HYDROMORPHONE HCL 1 MG/ML IJ SOLN
1.0000 mg | INTRAMUSCULAR | Status: DC | PRN
  Administered 2020-08-02 – 2020-08-03 (×3): 1 mg via INTRAVENOUS
  Filled 2020-08-02 (×3): qty 1

## 2020-08-02 NOTE — Progress Notes (Signed)
Date and time results received: 08/02/20 0900   Test: hgb Critical Value: 6.8  Name of Provider Notified: [place] Hollis  Orders Received? Or Actions Taken?: Existing orders followed.

## 2020-08-02 NOTE — Progress Notes (Addendum)
Subjective: [name] [name] isa 40 year old male with a medical history significant for sickle cell disease type SS, chronic pain syndrome, opiate dependence and tolerance, stage IV chronic kidney disease, history of polysubstance abuse, and history of anemia of chronic disease that was admitted for sickle cell pain crisis. Patient is complaining of lower extremity pain on today.  He characterizes pain as constant and throbbing.  Pain persists despite IV Dilaudid PCA.  Patient's major concern is that he will more than likely discharge home without any pain medications due to past cocaine use.  He currently denies any headache, chest pain, shortness of breath, nausea, vomiting, or diarrhea.   Objective:  Vital signs in last 24 hours:  Vitals:   08/02/20 0939 08/02/20 0941 08/02/20 1017 08/02/20 1153  BP:   105/80   Pulse:   89   Resp: '14 15 15 15  '$ Temp:   98.3 F (36.8 C)   TempSrc:   Oral   SpO2: 97%  97% 100%  Weight:        Intake/Output from previous day:   Intake/Output Summary (Last 24 hours) at 08/02/2020 1253 Last data filed at 08/02/2020 0941 Gross per 24 hour  Intake 1553.04 ml  Output 3850 ml  Net -2296.96 ml    Physical Exam: General: Alert, awake, oriented x3, in no acute distress.  HEENT: [place]/AT PEERL, EOMI Neck: Trachea midline,  no masses, no thyromegal,y no JVD, no carotid bruit OROPHARYNX:  Moist, No exudate/ erythema/lesions.  Heart: Regular rate and rhythm, without murmurs, rubs, gallops, PMI non-displaced, no heaves or thrills on palpation.  Lungs: Clear to auscultation, no wheezing or rhonchi noted. No increased vocal fremitus resonant to percussion  Abdomen: Soft, nontender, nondistended, positive bowel sounds, no masses no hepatosplenomegaly noted..  Neuro: No focal neurological deficits noted cranial nerves II through XII grossly intact. DTRs 2+ bilaterally upper and lower extremities. Strength 5 out of 5 in bilateral upper and lower  extremities. Musculoskeletal: No warm swelling or erythema around joints, no spinal tenderness noted. Psychiatric: Patient alert and oriented x3, good insight and cognition, good recent to remote recall. Lymph node survey: No cervical axillary or inguinal lymphadenopathy noted.  Lab Results:  Basic Metabolic Panel:    Component Value Date/Time   NA 131 (L) 08/02/2020 0727   NA 135 07/14/2020 1005   K 4.4 08/02/2020 0727   CL 104 08/02/2020 0727   CO2 21 (L) 08/02/2020 0727   BUN 22 (H) 08/02/2020 0727   BUN 35 (H) 07/14/2020 1005   CREATININE 2.47 (H) 08/02/2020 0727   CREATININE 1.45 (H) 05/12/2014 1430   GLUCOSE 87 08/02/2020 0727   CALCIUM 8.1 (L) 08/02/2020 0727   CBC:    Component Value Date/Time   WBC 12.7 (H) 08/02/2020 0727   HGB 6.8 (LL) 08/02/2020 0727   HGB 8.6 (L) 07/14/2020 1005   HCT 19.5 (L) 08/02/2020 0727   HCT 23.6 (L) 07/14/2020 1005   PLT 163 08/02/2020 0727   PLT 339 07/14/2020 1005   MCV 105.4 (H) 08/02/2020 0727   MCV 117 (H) 07/14/2020 1005   NEUTROABS 5.2 08/02/2020 0727   NEUTROABS 7.0 07/14/2020 1005   LYMPHSABS 5.9 (H) 08/02/2020 0727   LYMPHSABS 6.2 (H) 07/14/2020 1005   MONOABS 1.0 08/02/2020 0727   EOSABS 0.5 08/02/2020 0727   EOSABS 0.3 07/14/2020 1005   BASOSABS 0.0 08/02/2020 0727   BASOSABS 0.0 07/14/2020 1005    Recent Results (from the past 240 hour(s))  Resp Panel by RT-PCR (Flu A&B,  Covid) Nasopharyngeal Swab     Status: None   Collection Time: 07/30/20  2:39 PM   Specimen: Nasopharyngeal Swab; Nasopharyngeal(NP) swabs in vial transport medium  Result Value Ref Range Status   SARS Coronavirus 2 by RT PCR NEGATIVE NEGATIVE Final    Comment: (NOTE) SARS-CoV-2 target nucleic acids are NOT DETECTED.  The SARS-CoV-2 RNA is generally detectable in upper respiratory specimens during the acute phase of infection. The lowest concentration of SARS-CoV-2 viral copies this assay can detect is 138 copies/mL. A negative result does not  preclude SARS-Cov-2 infection and should not be used as the sole basis for treatment or other patient management decisions. A negative result may occur with  improper specimen collection/handling, submission of specimen other than nasopharyngeal swab, presence of viral mutation(s) within the areas targeted by this assay, and inadequate number of viral copies(<138 copies/mL). A negative result must be combined with clinical observations, patient history, and epidemiological information. The expected result is Negative.  Fact Sheet for Patients:  EntrepreneurPulse.com.au  Fact Sheet for Healthcare Providers:  IncredibleEmployment.be  This test is no t yet approved or cleared by the [place] FDA and  has been authorized for detection and/or diagnosis of SARS-CoV-2 by FDA under an Emergency Use Authorization (EUA). This EUA will remain  in effect (meaning this test can be used) for the duration of the COVID-19 declaration under Section 564(b)(1) of the Act, 21 U.S.C.section 360bbb-3(b)(1), unless the authorization is terminated  or revoked sooner.       Influenza A by PCR NEGATIVE NEGATIVE Final   Influenza B by PCR NEGATIVE NEGATIVE Final    Comment: (NOTE) The Xpert Xpress SARS-CoV-2/FLU/RSV plus assay is intended as an aid in the diagnosis of influenza from Nasopharyngeal swab specimens and should not be used as a sole basis for treatment. Nasal washings and aspirates are unacceptable for Xpert Xpress SARS-CoV-2/FLU/RSV testing.  Fact Sheet for Patients: EntrepreneurPulse.com.au  Fact Sheet for Healthcare Providers: IncredibleEmployment.be  This test is not yet approved or cleared by the [place] FDA and has been authorized for detection and/or diagnosis of SARS-CoV-2 by FDA under an Emergency Use Authorization (EUA). This EUA will remain in effect (meaning this test can be used) for the  duration of the COVID-19 declaration under Section 564(b)(1) of the Act, 21 U.S.C. section 360bbb-3(b)(1), unless the authorization is terminated or revoked.  Performed at [facility], [place] [address] [place],  [address]     Studies/Results: No results found.  Medications: Scheduled Meds:  allopurinol  100 mg Oral Daily   Deferiprone (Twice Daily)  1,000 mg Oral BID   folic acid  1 mg Oral Daily   heparin  5,000 Units Subcutaneous Q8H   HYDROmorphone   Intravenous Q4H   hydroxyurea  500 mg Oral Daily   nicotine  21 mg Transdermal Daily   nortriptyline  25 mg Oral QHS   oxyCODONE  15 mg Oral Q12H   senna-docusate  1 tablet Oral BID   sodium bicarbonate  1,950 mg Oral TID   sodium zirconium cyclosilicate  5 g Oral Once   voxelotor  1,500 mg Oral Daily   Continuous Infusions:  diphenhydrAMINE Stopped (08/01/20 1754)   PRN Meds:.acetaminophen, diphenhydrAMINE **OR** diphenhydrAMINE, HYDROmorphone (DILAUDID) injection, naloxone **AND** sodium chloride flush, ondansetron (ZOFRAN) IV, oxyCODONE, polyethylene glycol  Consultants: None  Procedures: None   Antibiotics: None  Assessment/Plan: Principal Problem:   Sickle cell anemia with crisis ([place]) Active Problems:   TOBACCO ABUSE   Chronic  kidney disease (CKD), stage IV (severe) (HCC)   Cocaine abuse (HCC)   Hyperkalemia   Acute kidney injury superimposed on chronic kidney disease (HCC)   GERD (gastroesophageal reflux disease)   Chronic pain syndrome  Sickle cell disease with  pain crisis:  Decrease IV fluids to KVO Weaning IV Dilaudid PCA, settings decreased today Oxycodone 10 mg every 6 hours for severe breakthrough pain No Toradol due to chronic kidney disease Monitor vital signs very closely, reevaluate pain scale regularly, and supplemental oxygen as needed  Anemia of chronic disease: Today, hemoglobin is 6.8, below patient's baseline.  Patient is s/p 1 unit PRBCs.  Will  discontinue IV fluids, possibly hemodilution.  Review labs in AM.  Acute on chronic kidney disease stage IV: Renal functioning is continuing to improve.  Creatinine is 2.4 today, which is consistent with his baseline.  Monitor BUN and creatinine closely, repeat labs in AM.  Hyperkalemia: Resolved.  Lokelma discontinued on yesterday.  We will continue to monitor closely.  Leukocytosis: Improving.  Considered to be secondary to vaso-occlusive crisis.  Patient is afebrile without any signs of acute infection.  Continue to follow closely.  History of polysubstance abuse: Patient has past history of cocaine and marijuana use.  Urine drug screen pending.  Patient is concerned that he will be discharged home without prescriptions for his chronic opiates.  Discussed at length.  Offered drug rehabilitation, patient declined.  He denies current cocaine use.  Chronic pain syndrome: Continue his home medications.  Code Status: Full Code Family Communication: N/A Disposition Plan: Not yet ready for discharge  [place], MSN, FNP-C Patient [place] [address] [place],  [address] [phone]  If 5PM-8AM, please contact night-coverage.  08/02/2020, 12:53 PM  LOS: 3 days

## 2020-08-02 NOTE — Care Management Important Message (Signed)
Important Message  Patient Details IM Letter given to the Patient. Name: [name] [name]. MRN: [id number] Date of Birth: 09/07/1980   Medicare Important Message Given:  Yes     Kerin Salen 08/02/2020, 11:21 AM

## 2020-08-03 ENCOUNTER — Other Ambulatory Visit (HOSPITAL_COMMUNITY): Payer: Self-pay

## 2020-08-03 DIAGNOSIS — D5700 Hb-SS disease with crisis, unspecified: Secondary | ICD-10-CM | POA: Diagnosis not present

## 2020-08-03 MED ORDER — OXYCODONE HCL ER 15 MG PO T12A
15.0000 mg | EXTENDED_RELEASE_TABLET | Freq: Two times a day (BID) | ORAL | 0 refills | Status: DC
  Filled 2020-08-03: qty 14, 7d supply, fill #0

## 2020-08-03 MED ORDER — OXYCODONE HCL 10 MG PO TABS
10.0000 mg | ORAL_TABLET | Freq: Four times a day (QID) | ORAL | 0 refills | Status: DC
  Filled 2020-08-03: qty 30, 7d supply, fill #0

## 2020-08-03 NOTE — Discharge Summary (Signed)
Physician Discharge Summary  [name] [name]. ID:[id number] DOB: 1980/07/02 DOA: 07/30/2020  PCP: [name], MD  Admit date: 07/30/2020  Discharge date: 08/03/2020  Discharge Diagnoses:  Principal Problem:   Sickle cell anemia with crisis ([place]) Active Problems:   TOBACCO ABUSE   Chronic kidney disease (CKD), stage IV (severe) (HCC)   Cocaine abuse (HCC)   Hyperkalemia   Acute kidney injury superimposed on chronic kidney disease (HCC)   GERD (gastroesophageal reflux disease)   Chronic pain syndrome   Discharge Condition: Stable  Disposition:   Follow-up Information     [name], MD Follow up in 1 week(s).   Specialty: Internal Medicine Why: Schedule lab appt in 1 week at [facility] Contact information: [place] [address] [id number]         Buford Dresser, MD .   Specialty: Cardiology Contact information: [address] [place] [address] [phone]                Pt is discharged home in good condition and is to follow up with [name], MD this week to have labs evaluated. [name] A Whipkey Jr. is instructed to increase activity slowly and balance with rest for the next few days, and use prescribed medication to complete treatment of pain  Diet: Regular Wt Readings from Last 3 Encounters:  08/01/20 54.6 kg  06/15/20 56.7 kg  05/24/20 52.6 kg    History of present illness:  [name] [name] is a 40 year old male with a medical history significant for sickle cell disease, cocaine abuse, chronic kidney disease stage IV, GERD, tobacco use, chronic osteomyelitis of right knee with medication noncompliance, chronic pain syndrome, opiate dependence and tolerance who presents to the ER with the sickle cell pain crisis.  Patient has been off of his narcotics for almost 4 weeks except for oxycodone in which she ran out a few days ago.  He has consistently had cocaine in his system, so  he has been cut off from these medications.  He came to the ER with a complaint of worsening pain in his back and legs.  He has been taking just Tylenol in the last few days.  Denies any fever or chills, denied nausea, vomiting, or diarrhea.  Patient came to the ER and has been given Dilaudid 2 mg every 30 minutes x 3, but is still having significant pain.  Also, noted to have a hemoglobin decrease of 6.0 g/dL.  His baseline hemoglobin is usually around 7.0 g/dL.  For this reason, patient was admitted to the hospital with a sickle cell painful and hemolytic crisis.  He is tachycardic, otherwise no fever or chills.  ER course: Temperature 97.7, blood pressure 142/89, pulse 121, respirations of 29, oxygen saturation 95% on room air.  WBC count 16.7, hemoglobin 6.0 g/dL, and platelets 180,000.  Sodium 136, potassium 5.6, chloride 109, CO2 20, BUN 36, creatinine of 3.25, and calcium 8.5.  Glucose is 97.  Chest x-ray showed no acute findings.  Patient being admitted with acute on chronic kidney failure with sickle cell crisis.  Hospital Course:  Sickle cell disease with pain crisis: Patient admitted to [facility] for further management of his sickle cell pain crisis.  Pain managed with IV Dilaudid PCA, oxycodone, and OxyContin.  IV Dilaudid PCA was weaned appropriately and patient was transition to home medications.  Patient attributed his pain crisis to being out of his narcotic pain medications over the past several weeks.  On 07/01/2020, patient was found to have a positive cocaine.  Therefore, PCP was unable to prescribe opiate medications.  Will complete patient's treatment with oxycodone 10 mg every 6 hours #30 and OxyContin 15 mg every 12 hours #14.  Medications were sent to patient's pharmacy.  Prior to prescribing opiate medications, PDMP was reviewed and no inconsistencies were noted.  Patient's most recent drug screen was on 07/14/2020, which was not positive for cocaine.  Patient counseled at length on the  dangers of using illicit substances, especially in the setting of sickle cell disease.  Patient expressed understanding. Patient will undergo meeting with [facility] to discuss pain medication regimen going forward.  Patient will be contacted for meeting time.  Offered information about drug rehabilitation services, patient declined.  Acute on chronic kidney disease: Patient has a long history of chronic kidney disease stage IV.  He has had worsening creatinine over the past 6 months.  Prior to discharge, creatinine is 3.25, which is consistent with patient's baseline.  He is followed by nephrology as an outpatient.    Anemia of chronic disease: On admission, patient's hemoglobin was 6.0 g/dL.  His baseline is typically 7-8 g/dL.  Patient transfused 1 unit PRBCs without complication.  Patient's hemoglobin was 6.8 prior to discharge.  Patient will follow-up with PCP on 08/05/2020 to repeat complete blood count with differential and complete metabolic panel.  Patient expressed understanding.  Patient advised to resume [place] upon returning home.  Follow-up with hematology as scheduled.  Patient is aware of all upcoming appointments.  He is alert, oriented, and ambulating without assistance.  Patient was therefore discharged home today in a hemodynamically stable condition.   [name] [name] will follow-up with PCP within 1 week of this discharge. [name] [name] was counseled extensively about nonpharmacologic means of pain management, patient verbalized understanding and was appreciative of  the care received during this admission.   We discussed the need for good hydration, monitoring of hydration status, avoidance of heat, cold, stress, and infection triggers. We discussed the need to be adherent with taking Hydrea and other home medications. Patient was reminded of the need to seek medical attention immediately if any symptom of bleeding, anemia, or infection occurs.  Also, patient advised to  continue follow-up with hematology as scheduled.  Discharge Exam: Vitals:   08/03/20 1001 08/03/20 1110  BP: 111/74   Pulse: 90   Resp: 16 16  Temp: 98.7 F (37.1 C)   SpO2: 99% 98%   Vitals:   08/03/20 0455 08/03/20 0831 08/03/20 1001 08/03/20 1110  BP: 131/79  111/74   Pulse: (!) 101  90   Resp: '18 18 16 16  '$ Temp: 98.4 F (36.9 C)  98.7 F (37.1 C)   TempSrc: Oral  Oral   SpO2: 100% 98% 99% 98%  Weight:        General appearance : Awake, alert, not in any distress. Speech Clear. Not toxic looking HEENT: Atraumatic and Normocephalic, pupils equally reactive to light and accomodation Neck: Supple, no JVD. No cervical lymphadenopathy.  Chest: Good air entry bilaterally, no added sounds  CVS: S1 S2 regular, no murmurs.  Abdomen: Bowel sounds present, Non tender and not distended with no gaurding, rigidity or rebound. Extremities: B/L Lower Ext shows no edema, both legs are warm to touch Neurology: Awake alert, and oriented X 3, CN II-XII intact, Non focal Skin: No Rash  Discharge Instructions   Allergies as of 08/03/2020       Reactions  Nsaids Other (See Comments)   Kidney disease   Morphine And Related Other (See Comments)   "Real bad headaches"         Medication List     TAKE these medications    acetaminophen 650 MG CR tablet Commonly known as: TYLENOL Take 650 mg by mouth every 8 (eight) hours as needed for pain.   allopurinol 100 MG tablet Commonly known as: ZYLOPRIM Take 100 mg by mouth daily.   Ferriprox Twice-A-Day 1000 MG Tabs Generic drug: Deferiprone (Twice Daily) Take 1,000 mg by mouth 2 (two) times daily.   folic acid 1 MG tablet Commonly known as: FOLVITE TAKE 1 TABLET BY MOUTH EVERY DAY   hydroxyurea 500 MG capsule Commonly known as: HYDREA Take 500 mg by mouth daily.   Lokelma 10 g Pack packet Generic drug: sodium zirconium cyclosilicate Take 10 g by mouth every other day.   nortriptyline 25 MG capsule Commonly known as:  PAMELOR TAKE 1 CAPSULE BY MOUTH TWICE A DAY What changed: when to take this   Oxbryta 500 MG Tabs tablet Generic drug: voxelotor Take 1,500 mg by mouth daily.   OxyCONTIN 15 mg 12 hr tablet Generic drug: oxyCODONE Take 1 tablet (15 mg total) by mouth every 12 (twelve) hours. What changed: Another medication with the same name was changed. Make sure you understand how and when to take each.   Oxycodone HCl 10 MG Tabs Take 1 tablet (10 mg total) by mouth every 6 (six) hours. What changed: when to take this   sodium bicarbonate 650 MG tablet Take 1,950 mg by mouth 3 (three) times daily.       ASK your doctor about these medications    Vitamin D3 50 MCG (2000 UT) Tabs Take 1 each by mouth daily.        The results of significant diagnostics from this hospitalization (including imaging, microbiology, ancillary and laboratory) are listed below for reference.    Significant Diagnostic Studies: DG Chest 2 View  Result Date: 07/30/2020 CLINICAL DATA:  Sickle cell pain crisis EXAM: CHEST - 2 VIEW COMPARISON:  09/30/2019 FINDINGS: Stable cardiomediastinal contours. Mildly coarsened interstitial markings bilaterally. Streaky left basilar opacity. No pleural effusion or pneumothorax. 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 IMPRESSION: Streaky left basilar opacity may reflect atelectasis or developing infiltrate. Electronically Signed   By: Davina Poke D.O.   On: 07/30/2020 17:21    Microbiology: Recent Results (from the past 240 hour(s))  Resp Panel by RT-PCR (Flu A&B, Covid) Nasopharyngeal Swab     Status: None   Collection Time: 07/30/20  2:39 PM   Specimen: Nasopharyngeal Swab; Nasopharyngeal(NP) swabs in vial transport medium  Result Value Ref Range Status   SARS Coronavirus 2 by RT PCR NEGATIVE NEGATIVE Final    Comment: (NOTE) SARS-CoV-2 target nucleic acids are NOT DETECTED.  The SARS-CoV-2 RNA is generally  detectable in upper respiratory specimens during the acute phase of infection. The lowest concentration of SARS-CoV-2 viral copies this assay can detect is 138 copies/mL. A negative result does not preclude SARS-Cov-2 infection and should not be used as the sole basis for treatment or other patient management decisions. A negative result may occur with  improper specimen collection/handling, submission of specimen other than nasopharyngeal swab, presence of viral mutation(s) within the areas targeted by this assay, and inadequate number of viral copies(<138 copies/mL). A negative result must be combined with clinical observations, patient history, and epidemiological information. The  expected result is Negative.  Fact Sheet for Patients:  EntrepreneurPulse.com.au  Fact Sheet for Healthcare Providers:  IncredibleEmployment.be  This test is no t yet approved or cleared by the [place] FDA and  has been authorized for detection and/or diagnosis of SARS-CoV-2 by FDA under an Emergency Use Authorization (EUA). This EUA will remain  in effect (meaning this test can be used) for the duration of the COVID-19 declaration under Section 564(b)(1) of the Act, 21 U.S.C.section 360bbb-3(b)(1), unless the authorization is terminated  or revoked sooner.       Influenza A by PCR NEGATIVE NEGATIVE Final   Influenza B by PCR NEGATIVE NEGATIVE Final    Comment: (NOTE) The Xpert Xpress SARS-CoV-2/FLU/RSV plus assay is intended as an aid in the diagnosis of influenza from Nasopharyngeal swab specimens and should not be used as a sole basis for treatment. Nasal washings and aspirates are unacceptable for Xpert Xpress SARS-CoV-2/FLU/RSV testing.  Fact Sheet for Patients: EntrepreneurPulse.com.au  Fact Sheet for Healthcare Providers: IncredibleEmployment.be  This test is not yet approved or cleared by the [place] FDA  and has been authorized for detection and/or diagnosis of SARS-CoV-2 by FDA under an Emergency Use Authorization (EUA). This EUA will remain in effect (meaning this test can be used) for the duration of the COVID-19 declaration under Section 564(b)(1) of the Act, 21 U.S.C. section 360bbb-3(b)(1), unless the authorization is terminated or revoked.  Performed at [facility], [place] [address] [place], [place] [address]      Labs: Basic Metabolic Panel: Recent Labs  Lab 07/30/20 1427 07/31/20 0600 08/02/20 0727  NA 136 132* 131*  K 5.6* 4.3 4.4  CL 109 107 104  CO2 20* 18* 21*  GLUCOSE 97 104* 87  BUN 36* 30* 22*  CREATININE 3.25* 2.96* 2.47*  CALCIUM 8.5* 8.5* 8.1*   Liver Function Tests: Recent Labs  Lab 07/30/20 1427 07/31/20 0600 08/02/20 0727  AST 49* 44* 44*  ALT 31 32 28  ALKPHOS 309* 283* 263*  BILITOT 1.4* 1.1 1.2  PROT 6.9 6.4* 5.8*  ALBUMIN 2.9* 2.8* 2.5*   No results for input(s): LIPASE, AMYLASE in the last 168 hours. No results for input(s): AMMONIA in the last 168 hours. CBC: Recent Labs  Lab 07/30/20 1427 07/31/20 0600 08/01/20 0851 08/02/20 0727  WBC 16.7* 17.1* 16.6* 12.7*  NEUTROABS 7.8* 7.2  --  5.2  HGB 6.0* 7.1* 7.3* 6.8*  HCT 17.3* 20.5* 20.8* 19.5*  MCV 120.1* 106.2* 105.1* 105.4*  PLT 180 172 162 163   Cardiac Enzymes: No results for input(s): CKTOTAL, CKMB, CKMBINDEX, TROPONINI in the last 168 hours. BNP: Invalid input(s): POCBNP CBG: No results for input(s): GLUCAP in the last 168 hours.  Time coordinating discharge: 35 minutes  Signed: [name]  APRN, MSN, FNP-C Patient [place] [address] [place], [place] [address] [phone]  [facility] 08/04/2020, 9:30 PM

## 2020-08-10 ENCOUNTER — Other Ambulatory Visit: Payer: Medicare Other | Admitting: Internal Medicine

## 2020-08-10 ENCOUNTER — Other Ambulatory Visit (HOSPITAL_COMMUNITY): Payer: Self-pay

## 2020-08-10 ENCOUNTER — Other Ambulatory Visit: Payer: Self-pay

## 2020-08-10 ENCOUNTER — Telehealth: Payer: Self-pay

## 2020-08-10 NOTE — Telephone Encounter (Signed)
Oxycodone  Oxycontin

## 2020-08-11 ENCOUNTER — Other Ambulatory Visit: Payer: Self-pay | Admitting: Family Medicine

## 2020-08-11 ENCOUNTER — Other Ambulatory Visit (HOSPITAL_COMMUNITY): Payer: Self-pay

## 2020-08-11 ENCOUNTER — Non-Acute Institutional Stay (HOSPITAL_COMMUNITY)
Admission: RE | Admit: 2020-08-11 | Discharge: 2020-08-11 | Disposition: A | Discharge status: Home / Self Care | Payer: Medicare Other | Source: Ambulatory Visit | Attending: Internal Medicine | Admitting: Internal Medicine

## 2020-08-11 DIAGNOSIS — D571 Sickle-cell disease without crisis: Secondary | ICD-10-CM | POA: Diagnosis not present

## 2020-08-11 DIAGNOSIS — G894 Chronic pain syndrome: Secondary | ICD-10-CM

## 2020-08-11 DIAGNOSIS — D5700 Hb-SS disease with crisis, unspecified: Secondary | ICD-10-CM

## 2020-08-11 LAB — CBC WITH DIFFERENTIAL/PLATELET
Abs Immature Granulocytes: 0 10*3/uL (ref 0.00–0.07)
Basophils Absolute: 0 10*3/uL (ref 0.0–0.1)
Basophils Relative: 0 %
Eosinophils Absolute: 0.5 10*3/uL (ref 0.0–0.5)
Eosinophils Relative: 3 %
HCT: 20.1 % — ABNORMAL LOW (ref 39.0–52.0)
Hemoglobin: 6.7 g/dL — CL (ref 13.0–17.0)
Lymphocytes Relative: 41 %
Lymphs Abs: 6.8 10*3/uL — ABNORMAL HIGH (ref 0.7–4.0)
MCH: 36.4 pg — ABNORMAL HIGH (ref 26.0–34.0)
MCHC: 33.3 g/dL (ref 30.0–36.0)
MCV: 109.2 fL — ABNORMAL HIGH (ref 80.0–100.0)
Monocytes Absolute: 1 10*3/uL (ref 0.1–1.0)
Monocytes Relative: 6 %
Neutro Abs: 8.3 10*3/uL — ABNORMAL HIGH (ref 1.7–7.7)
Neutrophils Relative %: 50 %
Platelets: 312 10*3/uL (ref 150–400)
RBC: 1.84 MIL/uL — ABNORMAL LOW (ref 4.22–5.81)
RDW: 27 % — ABNORMAL HIGH (ref 11.5–15.5)
WBC: 16.6 10*3/uL — ABNORMAL HIGH (ref 4.0–10.5)
nRBC: 0.2 % (ref 0.0–0.2)

## 2020-08-11 LAB — RAPID URINE DRUG SCREEN, HOSP PERFORMED
Amphetamines: NOT DETECTED
Barbiturates: NOT DETECTED
Benzodiazepines: POSITIVE — AB
Cocaine: NOT DETECTED
Opiates: NOT DETECTED
Tetrahydrocannabinol: POSITIVE — AB

## 2020-08-11 LAB — COMPREHENSIVE METABOLIC PANEL
ALT: 34 U/L (ref 0–44)
AST: 47 U/L — ABNORMAL HIGH (ref 15–41)
Albumin: 2.8 g/dL — ABNORMAL LOW (ref 3.5–5.0)
Alkaline Phosphatase: 223 U/L — ABNORMAL HIGH (ref 38–126)
Anion gap: 8 (ref 5–15)
BUN: 45 mg/dL — ABNORMAL HIGH (ref 6–20)
CO2: 22 mmol/L (ref 22–32)
Calcium: 8 mg/dL — ABNORMAL LOW (ref 8.9–10.3)
Chloride: 107 mmol/L (ref 98–111)
Creatinine, Ser: 3.39 mg/dL — ABNORMAL HIGH (ref 0.61–1.24)
GFR, Estimated: 23 mL/min — ABNORMAL LOW (ref >60)
Glucose, Bld: 82 mg/dL (ref 70–99)
Potassium: 4.7 mmol/L (ref 3.5–5.1)
Sodium: 137 mmol/L (ref 135–145)
Total Bilirubin: 0.9 mg/dL (ref 0.3–1.2)
Total Protein: 6.3 g/dL — ABNORMAL LOW (ref 6.5–8.1)

## 2020-08-11 LAB — RETICULOCYTES
Immature Retic Fract: 32.9 % — ABNORMAL HIGH (ref 2.3–15.9)
RBC.: 1.82 MIL/uL — ABNORMAL LOW (ref 4.22–5.81)
Retic Count, Absolute: 143.4 10*3/uL (ref 19.0–186.0)
Retic Ct Pct: 7.9 % — ABNORMAL HIGH (ref 0.4–3.1)

## 2020-08-11 MED ORDER — OXYCODONE HCL 10 MG PO TABS
10.0000 mg | ORAL_TABLET | ORAL | 0 refills | Status: DC | PRN
  Filled 2020-08-11: qty 42, 7d supply, fill #0

## 2020-08-11 MED ORDER — OXYCODONE HCL ER 15 MG PO T12A
15.0000 mg | EXTENDED_RELEASE_TABLET | Freq: Two times a day (BID) | ORAL | 0 refills | Status: DC
  Filled 2020-08-11: qty 14, 7d supply, fill #0

## 2020-08-11 NOTE — Progress Notes (Signed)
Reviewed PDMP substance reporting system prior to prescribing opiate medications. No inconsistencies noted.    Meds ordered this encounter  Medications   oxyCODONE (OXYCONTIN) 15 mg 12 hr tablet    Sig: Take 1 tablet (15 mg total) by mouth every 12 (twelve) hours.    Dispense:  14 tablet    Refill:  0    Order Specific Question:   Supervising Provider    Answer:   [name] [id number]   Oxycodone HCl 10 MG TABS    Sig: Take 1 tablet (10 mg total) by mouth every 4 (four) hours as needed.    Dispense:  42 tablet    Refill:  0    Order Specific Question:   Supervising Provider    Answer:   [name] [id number]

## 2020-08-11 NOTE — Progress Notes (Addendum)
Patient's labs drawn (CBC w/diff; CMP and Retic) by phlebotomist and urine sample collected. Patient tolerated well. Alert, oriented and ambulatory at discharge.

## 2020-08-12 ENCOUNTER — Telehealth: Payer: Self-pay | Admitting: Family Medicine

## 2020-08-12 ENCOUNTER — Other Ambulatory Visit (HOSPITAL_COMMUNITY): Payer: Self-pay

## 2020-08-12 DIAGNOSIS — G4700 Insomnia, unspecified: Secondary | ICD-10-CM

## 2020-08-12 DIAGNOSIS — Z8669 Personal history of other diseases of the nervous system and sense organs: Secondary | ICD-10-CM

## 2020-08-12 MED ORDER — MIRTAZAPINE 7.5 MG PO TABS
7.5000 mg | ORAL_TABLET | Freq: Every day | ORAL | 1 refills | Status: DC
Start: 2020-08-12 — End: 2020-12-10
  Filled 2020-08-12: qty 15, 15d supply, fill #0

## 2020-08-12 NOTE — Telephone Encounter (Signed)
[name] [name] is a 40 year old male with a medical history consistent of sickle cell disease, history of migraine headaches, chronic kidney disease stage IV, and severe anemia of chronic disease presented today clinic for lab on 08/11/2020.  Patient's hemoglobin is 6.7 g/dL, his baseline is 7-8 g/dL.  No transfusion warranted at this time.  Patient will need to report to sickle cell day clinic on Tuesday morning for labs and possibility of blood transfusion on that day.  If hemoglobin is less than 6.0 g/dL, he will need to be transfused in clinic.  Also, to stay conservative as his weekly pill count.  Have him contact Estanislado Emms, LCSW, if transportation is warranted on that day.  If symptoms of fatigue, shortness of breath, dizziness, or severe pain occur, patient will need to report to the closest emergency department.  Please discussed the importance of taking Oxbryta and hydroxyurea as prescribed by his hematologist.  Patient complaining of ongoing migraine headaches, unrelieved by the nortriptyline.  Nortriptyline has been discontinued at this juncture.  Patient has been lost to follow-up with his neurologist.  He is unable to get to appointments consistently due to transportation constraints.  We will make a new referral to a local neurologist for further evaluation of migraine headaches in the setting of sickle cell disease.  Patient complained of insomnia.  He has a difficult time falling asleep and staying asleep.  We will start a trial of Remeron for 15 days.  Patient advised to refrain from alcohol or drug use while using Remeron.  Do not drive or operate machinery while taking this medication.  Advised to take medication 30 to 45 minutes prior to bedtime.   Meds ordered this encounter  Medications   mirtazapine (REMERON) 7.5 MG tablet    Sig: Take 1 tablet (7.5 mg total) by mouth at bedtime.    Dispense:  15 tablet    Refill:  1    Order Specific Question:   Supervising Provider     Answer:   [name] [id number]    Orders Placed This Encounter  Procedures   Ambulatory referral to Neurology    Referral Priority:   Routine    Referral Type:   Consultation    Referral Reason:   Specialty Services Required    Requested Specialty:   Neurology    Number of Visits Requested:   [place], MSN, [facility] [address] [place], [place] [address] [phone]

## 2020-08-13 LAB — URINE DRUGS OF ABUSE SCREEN W ALC, ROUTINE (REF LAB)
Amphetamines, Urine: NEGATIVE ng/mL
Barbiturate, Ur: NEGATIVE ng/mL
Benzodiazepine Quant, Ur: NEGATIVE ng/mL
Cocaine (Metab.): NEGATIVE ng/mL
Ethanol U, Quan: NEGATIVE %
Methadone Screen, Urine: NEGATIVE ng/mL
Phencyclidine, Ur: NEGATIVE ng/mL
Propoxyphene, Urine: NEGATIVE ng/mL

## 2020-08-13 LAB — OPIATES CONFIRMATION, URINE: OPIATES: NEGATIVE

## 2020-08-13 LAB — PANEL 799049
CARBOXY THC GC/MS CONF: 71 ng/mL
Cannabinoid GC/MS, Ur: POSITIVE — AB

## 2020-08-19 DIAGNOSIS — Z131 Encounter for screening for diabetes mellitus: Secondary | ICD-10-CM | POA: Diagnosis not present

## 2020-08-19 DIAGNOSIS — Z79899 Other long term (current) drug therapy: Secondary | ICD-10-CM | POA: Diagnosis not present

## 2020-08-19 DIAGNOSIS — R0602 Shortness of breath: Secondary | ICD-10-CM | POA: Diagnosis not present

## 2020-08-19 DIAGNOSIS — E790 Hyperuricemia without signs of inflammatory arthritis and tophaceous disease: Secondary | ICD-10-CM | POA: Diagnosis not present

## 2020-08-19 DIAGNOSIS — R7303 Prediabetes: Secondary | ICD-10-CM | POA: Diagnosis not present

## 2020-08-19 DIAGNOSIS — R899 Unspecified abnormal finding in specimens from other organs, systems and tissues: Secondary | ICD-10-CM | POA: Diagnosis not present

## 2020-08-19 DIAGNOSIS — R519 Headache, unspecified: Secondary | ICD-10-CM | POA: Diagnosis not present

## 2020-08-19 DIAGNOSIS — N183 Chronic kidney disease, stage 3 unspecified: Secondary | ICD-10-CM | POA: Diagnosis not present

## 2020-08-19 DIAGNOSIS — D571 Sickle-cell disease without crisis: Secondary | ICD-10-CM | POA: Diagnosis not present

## 2020-08-20 ENCOUNTER — Other Ambulatory Visit: Payer: Self-pay | Admitting: Family Medicine

## 2020-08-20 ENCOUNTER — Telehealth: Payer: Self-pay

## 2020-08-20 ENCOUNTER — Non-Acute Institutional Stay (HOSPITAL_COMMUNITY)
Admission: RE | Admit: 2020-08-20 | Discharge: 2020-08-20 | Disposition: A | Discharge status: Home / Self Care | Payer: Medicare Other | Source: Ambulatory Visit | Attending: Internal Medicine | Admitting: Internal Medicine

## 2020-08-20 ENCOUNTER — Other Ambulatory Visit: Payer: Self-pay

## 2020-08-20 ENCOUNTER — Ambulatory Visit: Payer: Medicare Other | Admitting: Family Medicine

## 2020-08-20 ENCOUNTER — Other Ambulatory Visit (HOSPITAL_COMMUNITY): Payer: Self-pay

## 2020-08-20 DIAGNOSIS — G894 Chronic pain syndrome: Secondary | ICD-10-CM

## 2020-08-20 DIAGNOSIS — F1410 Cocaine abuse, uncomplicated: Secondary | ICD-10-CM

## 2020-08-20 DIAGNOSIS — D5700 Hb-SS disease with crisis, unspecified: Secondary | ICD-10-CM

## 2020-08-20 LAB — RAPID URINE DRUG SCREEN, HOSP PERFORMED
Amphetamines: NOT DETECTED
Barbiturates: NOT DETECTED
Benzodiazepines: POSITIVE — AB
Cocaine: NOT DETECTED
Opiates: NOT DETECTED
Tetrahydrocannabinol: POSITIVE — AB

## 2020-08-20 MED ORDER — OXYCODONE HCL ER 15 MG PO T12A
15.0000 mg | EXTENDED_RELEASE_TABLET | Freq: Two times a day (BID) | ORAL | 0 refills | Status: DC
  Filled 2020-08-20: qty 14, 7d supply, fill #0

## 2020-08-20 MED ORDER — OXYCODONE HCL 10 MG PO TABS
10.0000 mg | ORAL_TABLET | ORAL | 0 refills | Status: DC | PRN
  Filled 2020-08-20: qty 42, 7d supply, fill #0

## 2020-08-20 NOTE — Progress Notes (Signed)
Reviewed PDMP substance reporting system prior to prescribing opiate medications. No inconsistencies noted.  Meds ordered this encounter  Medications   oxyCODONE (OXYCONTIN) 15 mg 12 hr tablet    Sig: Take 1 tablet (15 mg total) by mouth every 12 (twelve) hours.    Dispense:  14 tablet    Refill:  0    Order Specific Question:   Supervising Provider    Answer:   [name] [id number]   Oxycodone HCl 10 MG TABS    Sig: Take 1 tablet (10 mg total) by mouth every 4 (four) hours as needed.    Dispense:  42 tablet    Refill:  0    Order Specific Question:   Supervising Provider    Answer:   [name] [id number]      [name]  APRN, MSN, FNP-C Patient [place] [address] [place],  [address] [phone]

## 2020-08-20 NOTE — Progress Notes (Signed)
Patient in to give urine sample and pill count. Patient did not have any Oxycodone and OxyContin pills in medicine bottle. Last filled 07/22/2020

## 2020-08-20 NOTE — Telephone Encounter (Signed)
Pt asking to see if he needs to take a drug TODAY?  He was asking about pain med's too!!

## 2020-08-20 NOTE — Progress Notes (Addendum)
Patient provided sample for urine drug screen. Sample taken to [facility] lab for processing.

## 2020-08-23 ENCOUNTER — Ambulatory Visit (INDEPENDENT_AMBULATORY_CARE_PROVIDER_SITE_OTHER): Payer: Medicare Other | Admitting: Nurse Practitioner

## 2020-08-23 VITALS — BP 98/58 | HR 109 | Temp 97.9°F | Resp 16 | Ht 63.0 in | Wt 124.0 lb

## 2020-08-23 DIAGNOSIS — Z0000 Encounter for general adult medical examination without abnormal findings: Secondary | ICD-10-CM

## 2020-08-23 NOTE — Patient Instructions (Addendum)
Mr. [name] [name] , Thank you for taking time to come for your Medicare Wellness Visit. I appreciate your ongoing commitment to your health goals. Please review the following plan we discussed and let me know if I can assist you in the future.   These are the goals we discussed:  Goals     . Quit Smoking        This is a list of the screening recommended for you and due dates:  Health Maintenance  Topic Date Due  . COVID-19 Vaccine (3 - Moderna risk series) 07/24/2019  . Flu Shot  09/13/2020  . Pneumococcal Vaccination (3 - PPSV23 or PCV20) 12/13/2021  . Tetanus Vaccine  06/14/2027  . Hepatitis C Screening: USPSTF Recommendation to screen - Ages 84-79 yo.  Completed  . HIV Screening  Completed  . HPV Vaccine  Aged Out    Fall Prevention in the Home, Adult Falls can cause injuries and can happen to people of all ages. There are many things you can do to make your home safe and to help prevent falls. Ask forhelp when making these changes. What actions can I take to prevent falls? General Instructions Use good lighting in all rooms. Replace any light bulbs that burn out. Turn on the lights in dark areas. Use night-lights. Keep items that you use often in easy-to-reach places. Lower the shelves around your home if needed. Set up your furniture so you have a clear path. Avoid moving your furniture around. Do not have throw rugs or other things on the floor that can make you trip. Avoid walking on wet floors. If any of your floors are uneven, fix them. Add color or contrast paint or tape to clearly mark and help you see: Grab bars or handrails. First and last steps of staircases. Where the edge of each step is. If you use a stepladder: Make sure that it is fully opened. Do not climb a closed stepladder. Make sure the sides of the stepladder are locked in place. Ask someone to hold the stepladder while you use it. Know where your pets are when moving through your home. What can I do  in the bathroom?     Keep the floor dry. Clean up any water on the floor right away. Remove soap buildup in the tub or shower. Use nonskid mats or decals on the floor of the tub or shower. Attach bath mats securely with double-sided, nonslip rug tape. If you need to sit down in the shower, use a plastic, nonslip stool. Install grab bars by the toilet and in the tub and shower. Do not use towel bars as grab bars. What can I do in the bedroom? Make sure that you have a light by your bed that is easy to reach. Do not use any sheets or blankets for your bed that hang to the floor. Have a firm chair with side arms that you can use for support when you get dressed. What can I do in the kitchen? Clean up any spills right away. If you need to reach something above you, use a step stool with a grab bar. Keep electrical cords out of the way. Do not use floor polish or wax that makes floors slippery. What can I do with my stairs? Do not leave any items on the stairs. Make sure that you have a light switch at the top and the bottom of the stairs. Make sure that there are handrails on both sides of the stairs. Fix  handrails that are broken or loose. Install nonslip stair treads on all your stairs. Avoid having throw rugs at the top or bottom of the stairs. Choose a carpet that does not hide the edge of the steps on the stairs. Check carpeting to make sure that it is firmly attached to the stairs. Fix carpet that is loose or worn. What can I do on the outside of my home? Use bright outdoor lighting. Fix the edges of walkways and driveways and fix any cracks. Remove anything that might make you trip as you walk through a door, such as a raised step or threshold. Trim any bushes or trees on paths to your home. Check to see if handrails are loose or broken and that both sides of all steps have handrails. Install guardrails along the edges of any raised decks and porches. Clear paths of anything that  can make you trip, such as tools or rocks. Have leaves, snow, or ice cleared regularly. Use sand or salt on paths during winter. Clean up any spills in your garage right away. This includes grease or oil spills. What other actions can I take? Wear shoes that: Have a low heel. Do not wear high heels. Have rubber bottoms. Feel good on your feet and fit well. Are closed at the toe. Do not wear open-toe sandals. Use tools that help you move around if needed. These include: Canes. Walkers. Scooters. Crutches. Review your medicines with your doctor. Some medicines can make you feel dizzy. This can increase your chance of falling. Ask your doctor what else you can do to help prevent falls. Where to find more information Centers for Disease Control and Prevention, [employer]: http://www.wolf.info/ National Institute on Aging: http://kim-miller.com/ Contact a doctor if: You are afraid of falling at home. You feel weak, drowsy, or dizzy at home. You fall at home. Summary There are many simple things that you can do to make your home safe and to help prevent falls. Ways to make your home safe include removing things that can make you trip and installing grab bars in the bathroom. Ask for help when making these changes in your home. This information is not intended to replace advice given to you by your health care provider. Make sure you discuss any questions you have with your healthcare provider. Document Revised: 09/03/2019 Document Reviewed: 09/03/2019 Elsevier Patient Education  [place] Maintenance, Male Adopting a healthy lifestyle and getting preventive care are important in promoting health and wellness. Ask your health care provider about: The right schedule for you to have regular tests and exams. Things you can do on your own to prevent diseases and keep yourself healthy. What should I know about diet, weight, and exercise? Eat a healthy diet  Eat a diet that includes plenty of  vegetables, fruits, low-fat dairy products, and lean protein. Do not eat a lot of foods that are high in solid fats, added sugars, or sodium.  Maintain a healthy weight Body mass index (BMI) is a measurement that can be used to identify possible weight problems. It estimates body fat based on height and weight. Your health care provider can help determine your BMI and help you achieve or maintain ahealthy weight. Get regular exercise Get regular exercise. This is one of the most important things you can do for your health. Most adults should: Exercise for at least 150 minutes each week. The exercise should increase your heart rate and make you sweat (moderate-intensity exercise). Do strengthening exercises  at least twice a week. This is in addition to the moderate-intensity exercise. Spend less time sitting. Even light physical activity can be beneficial. Watch cholesterol and blood lipids Have your blood tested for lipids and cholesterol at 38 years of age, then havethis test every 5 years. You may need to have your cholesterol levels checked more often if: Your lipid or cholesterol levels are high. You are older than 38 years of age. You are at high risk for heart disease. What should I know about cancer screening? Many types of cancers can be detected early and may often be prevented. Depending on your health history and family history, you may need to have cancer screening at various ages. This may include screening for: Colorectal cancer. Prostate cancer. Skin cancer. Lung cancer. What should I know about heart disease, diabetes, and high blood pressure? Blood pressure and heart disease High blood pressure causes heart disease and increases the risk of stroke. This is more likely to develop in people who have high blood pressure readings, are of African descent, or are overweight. Talk with your health care provider about your target blood pressure readings. Have your blood pressure  checked: Every 3-5 years if you are 63-61 years of age. Every year if you are 62 years old or older. If you are between the ages of 38 and 73 and are a current or former smoker, ask your health care provider if you should have a one-time screening for abdominal aortic aneurysm (AAA). Diabetes Have regular diabetes screenings. This checks your fasting blood sugar level. Have the screening done: Once every three years after age 87 if you are at a normal weight and have a low risk for diabetes. More often and at a younger age if you are overweight or have a high risk for diabetes. What should I know about preventing infection? Hepatitis B If you have a higher risk for hepatitis B, you should be screened for this virus. Talk with your health care provider to find out if you are at risk forhepatitis B infection. Hepatitis C Blood testing is recommended for: Everyone born from [birth year] through 1963. Anyone with known risk factors for hepatitis C. Sexually transmitted infections (STIs) You should be screened each year for STIs, including gonorrhea and chlamydia, if: You are sexually active and are younger than 38 years of age. You are older than 38 years of age and your health care provider tells you that you are at risk for this type of infection. Your sexual activity has changed since you were last screened, and you are at increased risk for chlamydia or gonorrhea. Ask your health care provider if you are at risk. Ask your health care provider about whether you are at high risk for HIV. Your health care provider may recommend a prescription medicine to help prevent HIV infection. If you choose to take medicine to prevent HIV, you should first get tested for HIV. You should then be tested every 3 months for as long as you are taking the medicine. Follow these instructions at home: Lifestyle Do not use any products that contain nicotine or tobacco, such as cigarettes, e-cigarettes, and chewing tobacco.  If you need help quitting, ask your health care provider. Do not use street drugs. Do not share needles. Ask your health care provider for help if you need support or information about quitting drugs. Alcohol use Do not drink alcohol if your health care provider tells you not to drink. If you drink alcohol: Limit how  much you have to 0-2 drinks a day. Be aware of how much alcohol is in your drink. In the [place] one drink equals one 12 oz bottle of beer (355 mL), one 5 oz glass of wine (148 mL), or one 1 oz glass of hard liquor (44 mL). General instructions Schedule regular health, dental, and eye exams. Stay current with your vaccines. Tell your health care provider if: You often feel depressed. You have ever been abused or do not feel safe at home. Summary Adopting a healthy lifestyle and getting preventive care are important in promoting health and wellness. Follow your health care provider's instructions about healthy diet, exercising, and getting tested or screened for diseases. Follow your health care provider's instructions on monitoring your cholesterol and blood pressure. This information is not intended to replace advice given to you by your health care provider. Make sure you discuss any questions you have with your healthcare provider. Document Revised: 01/23/2018 Document Reviewed: 01/23/2018 Elsevier Patient Education  2022 [place].  Steps to Quit Smoking Smoking tobacco is the leading cause of preventable death. It can affect almost every organ in the body. Smoking puts you and people around you at risk for many serious, long-lasting (chronic) diseases. Quitting smoking can be hard, but it is one of the best things thatyou can do for your health. It is never too late to quit. How do I get ready to quit? When you decide to quit smoking, make a plan to help you succeed. Before you quit: Pick a date to quit. Set a date within the next 2 weeks to give you time to  prepare. Write down the reasons why you are quitting. Keep this list in places where you will see it often. Tell your family, friends, and co-workers that you are quitting. Their support is important. Talk with your doctor about the choices that may help you quit. Find out if your health insurance will pay for these treatments. Know the people, places, things, and activities that make you want to smoke (triggers). Avoid them. What first steps can I take to quit smoking? Throw away all cigarettes at home, at work, and in your car. Throw away the things that you use when you smoke, such as ashtrays and lighters. Clean your car. Make sure to empty the ashtray. Clean your home, including curtains and carpets. What can I do to help me quit smoking? Talk with your doctor about taking medicines and seeing a counselor at the same time. You are more likely to succeed when you do both. If you are pregnant or breastfeeding, talk with your doctor about counseling or other ways to quit smoking. Do not take medicine to help you quit smoking unless your doctor tells you to do so. To quit smoking: Quit right away Quit smoking totally, instead of slowly cutting back on how much you smoke over a period of time. Go to counseling. You are more likely to quit if you go to counseling sessions regularly. Take medicine You may take medicines to help you quit. Some medicines need a prescription, and some you can buy over-the-counter. Some medicines may contain a drug called nicotine to replace the nicotine in cigarettes. Medicines may: Help you to stop having the desire to smoke (cravings). Help to stop the problems that come when you stop smoking (withdrawal symptoms). Your doctor may ask you to use: Nicotine patches, gum, or lozenges. Nicotine inhalers or sprays. Non-nicotine medicine that is taken by mouth. Find resources Find resources  and other ways to help you quit smoking and remain smoke-free after you  quit. These resources are most helpful when you use them often. They include: Online chats with a Social worker. Phone quitlines. Printed Furniture conservator/restorer. Support groups or group counseling. Text messaging programs. Mobile phone apps. Use apps on your mobile phone or tablet that can help you stick to your quit plan. There are many free apps for mobile phones and tablets as well as websites. Examples include Quit Guide from the [employer] and smokefree.gov  What things can I do to make it easier to quit?  Talk to your family and friends. Ask them to support and encourage you. Call a phone quitline (1-800-QUIT-NOW), reach out to support groups, or work with a Social worker. Ask people who smoke to not smoke around you. Avoid places that make you want to smoke, such as: Bars. Parties. Smoke-break areas at work. Spend time with people who do not smoke. Lower the stress in your life. Stress can make you want to smoke. Try these things to help your stress: Getting regular exercise. Doing deep-breathing exercises. Doing yoga. Meditating. Doing a body scan. To do this, close your eyes, focus on one area of your body at a time from head to toe. Notice which parts of your body are tense. Try to relax the muscles in those areas. How will I feel when I quit smoking? Day 1 to 3 weeks Within the first 24 hours, you may start to have some problems that come from quitting tobacco. These problems are very bad 2-3 days after you quit, but they do not often last for more than 2-3 weeks. You may get these symptoms: Mood swings. Feeling restless, nervous, angry, or annoyed. Trouble concentrating. Dizziness. Strong desire for high-sugar foods and nicotine. Weight gain. Trouble pooping (constipation). Feeling like you may vomit (nausea). Coughing or a sore throat. Changes in how the medicines that you take for other issues work in your body. Depression. Trouble sleeping (insomnia). Week 3 and afterward After  the first 2-3 weeks of quitting, you may start to notice more positive results, such as: Better sense of smell and taste. Less coughing and sore throat. Slower heart rate. Lower blood pressure. Clearer skin. Better breathing. Fewer sick days. Quitting smoking can be hard. Do not give up if you fail the first time. Some people need to try a few times before they succeed. Do your best to stick to your quit plan, and talk with yourdoctor if you have any questions or concerns. Summary Smoking tobacco is the leading cause of preventable death. Quitting smoking can be hard, but it is one of the best things that you can do for your health. When you decide to quit smoking, make a plan to help you succeed. Quit smoking right away, not slowly over a period of time. When you start quitting, seek help from your doctor, family, or friends. This information is not intended to replace advice given to you by your health care provider. Make sure you discuss any questions you have with your healthcare provider. Document Revised: 10/25/2018 Document Reviewed: 04/20/2018 Elsevier Patient Education  [place].

## 2020-08-23 NOTE — Progress Notes (Signed)
Subjective:   [name] [name]. is a 40 y.o. male who presents for Medicare Annual/Subsequent preventive examination.  Review of Systems    Review of Systems  Constitutional: Negative.   HENT: Negative.    Eyes: Negative.   Respiratory: Negative.    Cardiovascular: Negative.   Gastrointestinal: Negative.   Genitourinary: Negative.   Musculoskeletal:  Positive for joint pain (chronic right knee pain).  Skin: Negative.   Neurological: Negative.   Endo/Heme/Allergies: Negative.   Psychiatric/Behavioral: Negative.     Cardiac Risk Factors include: sedentary lifestyle;smoking/ tobacco exposure;male gender     Objective:    Today's Vitals   08/23/20 1307  BP: (!) 98/58  Pulse: (!) 109  Resp: 16  Temp: 97.9 F (36.6 C)  SpO2: 100%  Weight: 124 lb (56.2 kg)  Height: '5\' 3"'$  (1.6 m)   Body mass index is 21.97 kg/m.  Advanced Directives 07/30/2020 07/01/2020 06/03/2020 05/21/2020 04/29/2020 04/10/2020 03/22/2020  Does Patient Have a Medical Advance Directive? No No No No No No No  Would patient like information on creating a medical advance directive? No - Patient declined No - Patient declined - No - Patient declined No - Patient declined No - Patient declined No - Patient declined  Pre-existing out of facility DNR order (yellow form or pink MOST form) - - - - - - -    Current Medications (verified) Outpatient Encounter Medications as of 08/23/2020  Medication Sig   acetaminophen (TYLENOL) 650 MG CR tablet Take 650 mg by mouth every 8 (eight) hours as needed for pain.   allopurinol (ZYLOPRIM) 100 MG tablet Take 100 mg by mouth daily.   Cholecalciferol (VITAMIN D3) 50 MCG (2000 UT) TABS Take 1 each by mouth daily. (Patient not taking: Reported on 07/30/2020)   Deferiprone, Twice Daily, (FERRIPROX TWICE-A-DAY) 1000 MG TABS Take 1,000 mg by mouth 2 (two) times daily.   folic acid (FOLVITE) 1 MG tablet TAKE 1 TABLET BY MOUTH EVERY DAY   hydroxyurea (HYDREA) 500 MG capsule Take 500  mg by mouth daily.   LOKELMA 10 g PACK packet Take 10 g by mouth every other day.   mirtazapine (REMERON) 7.5 MG tablet Take 1 tablet (7.5 mg total) by mouth at bedtime.   OXBRYTA 500 MG TABS tablet Take 1,500 mg by mouth daily.   oxyCODONE (OXYCONTIN) 15 mg 12 hr tablet Take 1 tablet (15 mg total) by mouth every 12 (twelve) hours.   Oxycodone HCl 10 MG TABS Take 1 tablet (10 mg total) by mouth every 4 (four) hours as needed.   sodium bicarbonate 650 MG tablet Take 1,950 mg by mouth 3 (three) times daily.   No facility-administered encounter medications on file as of 08/23/2020.    Allergies (verified) Nsaids and Morphine and related   History: Past Medical History:  Diagnosis Date   Abscess of right iliac fossa 09/24/2018   required Perc Drain 09/24/2018   Arachnoid Cyst of brain bilaterally    "2 really small ones in the back of my head; inside; saw them w/MRI" (09/25/2012)   Bacterial pneumonia ~ 2012   "caught it here in the hospital" (09/25/2012)   Chronic kidney disease    "from my sickle cell" (09/25/2012)   CKD (chronic kidney disease), stage II    Colitis 04/19/2017   CT scan abd/ pelvis   GERD (gastroesophageal reflux disease)    "after I eat alot of spicey foods" (09/25/2012)   Gynecomastia, male 07/10/2012   History of blood transfusion    "  always related to sickle cell crisis" (09/25/2012)   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   Noted in noted from Hematology notes at [facility]    "take RX qd to prevent them" (09/25/2012)   Opioid dependence with withdrawal ([place]) 08/30/2012   Renal insufficiency    Sickle cell anemia ([facility])    Sickle cell crisis ([place]) 09/25/2012   Sickle cell nephropathy ([place]) 07/10/2012   Sinus tachycardia    Tachycardia with heart rate 121-140 beats per minute with ambulation 08/04/2016   Past Surgical History:  Procedure Laterality Date   ABCESS DRAINAGE     CHOLECYSTECTOMY  ~ 2012   COLONOSCOPY N/A 04/23/2017    Procedure: COLONOSCOPY;  Surgeon: Irene Shipper, MD;  Location: [facility];  Service: Endoscopy;  Laterality: N/A;   IR FLUORO GUIDE CV LINE RIGHT  12/17/2016   IR RADIOLOGIST EVAL & MGMT  10/02/2018   IR RADIOLOGIST EVAL & MGMT  10/15/2018   IR REMOVAL TUN CV CATH W/O FL  12/21/2016   IR US GUIDE VASC ACCESS RIGHT  12/17/2016   spleenectomy     Family History  Problem Relation Age of Onset   Breast cancer Mother    Social History   Socioeconomic History   Marital status: Single    Spouse name: Not on file   Number of children: Not on file   Years of education: Not on file   Highest education level: Not on file  Occupational History   Not on file  Tobacco Use   Smoking status: Some Days    Packs/day: 0.10    Pack years: 0.00    Types: Cigarettes   Smokeless tobacco: Never   Tobacco comments:    09/25/2012 "I don't buy cigarettes; bum one from friends q now and then"  Vaping Use   Vaping Use: Never used  Substance and Sexual Activity   Alcohol use: Yes    Alcohol/week: 0.0 standard drinks    Comment: Once a month   Drug use: Yes    Types: Marijuana    Comment: Every 2-3 weeks    Sexual activity: Yes  Other Topics Concern   Not on file  Social History Narrative    Lives alone in [place].   Back at school, studying Public relations account executive. Unemployed.    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    Social Determinants of Health   Financial Resource Strain: Low Risk    Difficulty of Paying Living Expenses: Not hard at all  Food Insecurity: No Food Insecurity   Worried About Charity fundraiser in the Last Year: Never true   Arboriculturist in the Last Year: Never true  Transportation Needs: Unmet Transportation Needs   Lack of Transportation (Medical): Yes   Lack of Transportation (Non-Medical): Yes  Physical Activity: Inactive    Days of Exercise per Week: 0 days   Minutes of Exercise per Session: 0 min  Stress: Stress Concern Present   Feeling of Stress : To some extent  Social Connections: Socially Isolated   Frequency of Communication with Friends and Family: More than three times a week   Frequency of Social Gatherings with Friends and Family: More than three times a week   Attends Religious Services: Never   Marine scientist or Organizations: No   Attends Club or  Organization Meetings: Never   Marital Status: Divorced    Tobacco Counseling Ready to quit: Yes Counseling given: Yes Tobacco comments: 09/25/2012 "I don't buy cigarettes; bum one from friends q now and then"   Clinical Intake:  Pre-visit preparation completed: No  Pain : No/denies pain     BMI - recorded: 21.97 Nutritional Status: BMI of 19-24  Normal Nutritional Risks: None Diabetes: No  How often do you need to have someone help you when you read instructions, pamphlets, or other written materials from your doctor or pharmacy?: 1 - Never What is the last grade level you completed in school?: 12th  Diabetic? no  Interpreter Needed?: No      Activities of Daily Living In your present state of health, do you have any difficulty performing the following activities: 08/23/2020 07/30/2020  Hearing? N N  Vision? N N  Difficulty concentrating or making decisions? N N  Walking or climbing stairs? Y N  Dressing or bathing? N N  Doing errands, shopping? N N  Preparing Food and eating ? N -  Using the Toilet? N -  In the past six months, have you accidently leaked urine? N -  Do you have problems with loss of bowel control? N -  Managing your Medications? N -  Managing your Finances? N -  Housekeeping or managing your Housekeeping? N -  Some recent data might be hidden    Patient Care Team: [name], MD as PCP - General (Internal Medicine) Buford Dresser, MD as PCP - Cardiology (Cardiology) Neila Gear, NP as Nurse Practitioner (Internal Medicine)  Indicate any recent Medical Services you may have received from other than [facility] providers in the past year (date may be approximate).     Assessment:   This is a routine wellness examination for Rilyn.  Hearing/Vision screen No results found.  Dietary issues and exercise activities discussed: Current Exercise Habits: The patient does not participate in regular exercise at present, Exercise limited by: orthopedic condition(s)   Goals Addressed             This Visit's Progress    Quit Smoking         Depression Screen PHQ 2/9 Scores 08/23/2020 03/16/2020 01/13/2020 08/05/2019 06/03/2019 04/15/2019 03/11/2019  PHQ - 2 Score 1 0 0 0 0 1 0    Fall Risk Fall Risk  08/23/2020 03/16/2020 01/13/2020 08/05/2019 06/03/2019  Falls in the past year? 0 0 0 0 0  Number falls in past yr: 0 - 0 - -  Injury with Fall? 0 - 0 - -  Risk for fall due to : No Fall Risks - - - -  Follow up Education provided Falls evaluation completed - - -    FALL RISK PREVENTION PERTAINING TO THE HOME:  Any stairs in or around the home? No  If so, are there any without handrails?  na Home free of loose throw rugs in walkways, pet beds, electrical cords, etc? Yes  Adequate lighting in your home to reduce risk of falls? Yes   ASSISTIVE DEVICES UTILIZED TO PREVENT FALLS:  Life alert? No  Use of a cane, walker or w/c? Yes cane  Grab bars in the bathroom? No  Shower chair or bench in shower? No  Elevated toilet seat or a handicapped toilet? No   TIMED UP AND GO:  Was the test performed? Yes .  Length of time to ambulate 10 feet: 5 sec.   Gait steady and fast  without use of assistive device  Cognitive Function:        Immunizations Immunization History  Administered Date(s) Administered   Influenza Split 12/08/2011   Influenza,inj,Quad PF,6+ Mos 10/11/2012, 12/13/2016, 11/15/2017, 01/28/2019, 01/13/2020   Influenza-Unspecified 10/11/2012,  12/13/2016   Moderna Sars-Covid-2 Vaccination 05/29/2019   Pneumococcal Conjugate-13 08/23/2017   Pneumococcal Polysaccharide-23 12/13/2016   Td 09/18/2004   Tdap 06/13/2017    TDAP status: Up to date  Flu Vaccine status: Up to date  Pneumococcal vaccine status: Up to date  Covid-19 vaccine status: Completed vaccines  Qualifies for Shingles Vaccine? Yes   Zostavax completed No   Shingrix Completed?: No.    Education has been provided regarding the importance of this vaccine. Patient has been advised to call insurance company to determine out of pocket expense if they have not yet received this vaccine. Advised may also receive vaccine at local pharmacy or Health Dept. Verbalized acceptance and understanding.  Screening Tests Health Maintenance  Topic Date Due   COVID-19 Vaccine (3 - Moderna risk series) 07/24/2019   INFLUENZA VACCINE  09/13/2020   Pneumococcal Vaccine 30-32 Years old (3 - PPSV23 or PCV20) 12/13/2021   TETANUS/TDAP  06/14/2027   Hepatitis C Screening  Completed   HIV Screening  Completed   HPV VACCINES  Aged Out    Health Maintenance  Health Maintenance Due  Topic Date Due   COVID-19 Vaccine (3 - Moderna risk series) 07/24/2019    Colorectal cancer screening: not indicated due to age  Lung Cancer Screening: (Low Dose CT Chest recommended if Age 47-80 years, 30 pack-year currently smoking OR have quit w/in 15years.) does not qualify.   Lung Cancer Screening Referral: na  Additional Screening:  Hepatitis C Screening: does qualify; Completed   Vision Screening: Recommended annual ophthalmology exams for early detection of glaucoma and other disorders of the eye. Is the patient up to date with their annual eye exam?  Yes  Who is the provider or what is the name of the office in which the patient attends annual eye exams? [facility] If pt is not established with a provider, would they like to be referred to a provider to establish care? No .   Dental  Screening: Recommended annual dental exams for proper oral hygiene  Community Resource Referral / Chronic Care Management: CRR required this visit?  No   CCM required this visit?  No      Plan:     I have personally reviewed and noted the following in the patient's chart:   Medical and social history Use of alcohol, tobacco or illicit drugs  Current medications and supplements including opioid prescriptions. Patient is currently taking opioid prescriptions. Information provided to patient regarding non-opioid alternatives. Patient advised to discuss non-opioid treatment plan with their provider. Functional ability and status Nutritional status Physical activity Advanced directives List of other physicians Hospitalizations, surgeries, and ER visits in previous 12 months Vitals Screenings to include cognitive, depression, and falls Referrals and appointments  In addition, I have reviewed and discussed with patient certain preventive protocols, quality metrics, and best practice recommendations. A written personalized care plan for preventive services as well as general preventive health recommendations were provided to patient.     Fenton Foy, NP   08/23/2020

## 2020-08-24 ENCOUNTER — Telehealth (HOSPITAL_COMMUNITY): Payer: Self-pay

## 2020-08-24 ENCOUNTER — Encounter (HOSPITAL_COMMUNITY): Payer: Self-pay | Admitting: Family Medicine

## 2020-08-24 ENCOUNTER — Inpatient Hospital Stay (HOSPITAL_COMMUNITY)
Admission: AD | Admit: 2020-08-24 | Discharge: 2020-08-26 | DRG: 812 | Disposition: A | Discharge status: Home / Self Care | Payer: Medicare Other | Source: Ambulatory Visit | Attending: Internal Medicine | Admitting: Internal Medicine

## 2020-08-24 DIAGNOSIS — Z8701 Personal history of pneumonia (recurrent): Secondary | ICD-10-CM | POA: Diagnosis not present

## 2020-08-24 DIAGNOSIS — N184 Chronic kidney disease, stage 4 (severe): Secondary | ICD-10-CM | POA: Diagnosis not present

## 2020-08-24 DIAGNOSIS — Z560 Unemployment, unspecified: Secondary | ICD-10-CM | POA: Diagnosis not present

## 2020-08-24 DIAGNOSIS — N179 Acute kidney failure, unspecified: Secondary | ICD-10-CM | POA: Diagnosis present

## 2020-08-24 DIAGNOSIS — D72829 Elevated white blood cell count, unspecified: Secondary | ICD-10-CM | POA: Diagnosis present

## 2020-08-24 DIAGNOSIS — G894 Chronic pain syndrome: Secondary | ICD-10-CM | POA: Diagnosis present

## 2020-08-24 DIAGNOSIS — Z79899 Other long term (current) drug therapy: Secondary | ICD-10-CM | POA: Diagnosis not present

## 2020-08-24 DIAGNOSIS — F17290 Nicotine dependence, other tobacco product, uncomplicated: Secondary | ICD-10-CM | POA: Diagnosis present

## 2020-08-24 DIAGNOSIS — N189 Chronic kidney disease, unspecified: Secondary | ICD-10-CM | POA: Diagnosis present

## 2020-08-24 DIAGNOSIS — K219 Gastro-esophageal reflux disease without esophagitis: Secondary | ICD-10-CM | POA: Diagnosis present

## 2020-08-24 DIAGNOSIS — E860 Dehydration: Secondary | ICD-10-CM | POA: Diagnosis not present

## 2020-08-24 DIAGNOSIS — D5700 Hb-SS disease with crisis, unspecified: Secondary | ICD-10-CM | POA: Diagnosis not present

## 2020-08-24 DIAGNOSIS — F17200 Nicotine dependence, unspecified, uncomplicated: Secondary | ICD-10-CM | POA: Diagnosis present

## 2020-08-24 LAB — COMPREHENSIVE METABOLIC PANEL
ALT: 27 U/L (ref 0–44)
AST: 40 U/L (ref 15–41)
Albumin: 2.5 g/dL — ABNORMAL LOW (ref 3.5–5.0)
Alkaline Phosphatase: 227 U/L — ABNORMAL HIGH (ref 38–126)
Anion gap: 8 (ref 5–15)
BUN: 36 mg/dL — ABNORMAL HIGH (ref 6–20)
CO2: 24 mmol/L (ref 22–32)
Calcium: 8 mg/dL — ABNORMAL LOW (ref 8.9–10.3)
Chloride: 104 mmol/L (ref 98–111)
Creatinine, Ser: 3.97 mg/dL — ABNORMAL HIGH (ref 0.61–1.24)
GFR, Estimated: 19 mL/min — ABNORMAL LOW (ref >60)
Glucose, Bld: 82 mg/dL (ref 70–99)
Potassium: 4.9 mmol/L (ref 3.5–5.1)
Sodium: 136 mmol/L (ref 135–145)
Total Bilirubin: 1.1 mg/dL (ref 0.3–1.2)
Total Protein: 5.9 g/dL — ABNORMAL LOW (ref 6.5–8.1)

## 2020-08-24 LAB — CBC WITH DIFFERENTIAL/PLATELET
Abs Immature Granulocytes: 0.06 10*3/uL (ref 0.00–0.07)
Basophils Absolute: 0 10*3/uL (ref 0.0–0.1)
Basophils Relative: 0 %
Eosinophils Absolute: 0.3 10*3/uL (ref 0.0–0.5)
Eosinophils Relative: 2 %
HCT: 18.6 % — ABNORMAL LOW (ref 39.0–52.0)
Hemoglobin: 6.3 g/dL — CL (ref 13.0–17.0)
Immature Granulocytes: 0 %
Lymphocytes Relative: 44 %
Lymphs Abs: 6.2 10*3/uL — ABNORMAL HIGH (ref 0.7–4.0)
MCH: 38.2 pg — ABNORMAL HIGH (ref 26.0–34.0)
MCHC: 33.9 g/dL (ref 30.0–36.0)
MCV: 112.7 fL — ABNORMAL HIGH (ref 80.0–100.0)
Monocytes Absolute: 1.1 10*3/uL — ABNORMAL HIGH (ref 0.1–1.0)
Monocytes Relative: 8 %
Neutro Abs: 6.5 10*3/uL (ref 1.7–7.7)
Neutrophils Relative %: 46 %
Platelets: 346 10*3/uL (ref 150–400)
RBC: 1.65 MIL/uL — ABNORMAL LOW (ref 4.22–5.81)
WBC: 14.2 10*3/uL — ABNORMAL HIGH (ref 4.0–10.5)
nRBC: 0.1 % (ref 0.0–0.2)

## 2020-08-24 LAB — RETICULOCYTES
Immature Retic Fract: 22.7 % — ABNORMAL HIGH (ref 2.3–15.9)
RBC.: 1.65 MIL/uL — ABNORMAL LOW (ref 4.22–5.81)
Retic Count, Absolute: 146.5 10*3/uL (ref 19.0–186.0)
Retic Ct Pct: 8.9 % — ABNORMAL HIGH (ref 0.4–3.1)

## 2020-08-24 LAB — PREPARE RBC (CROSSMATCH)

## 2020-08-24 MED ORDER — ACETAMINOPHEN 500 MG PO TABS
1000.0000 mg | ORAL_TABLET | Freq: Once | ORAL | Status: AC
  Administered 2020-08-24: 1000 mg via ORAL
  Filled 2020-08-24: qty 2

## 2020-08-24 MED ORDER — HYDROMORPHONE 1 MG/ML IV SOLN
INTRAVENOUS | Status: DC
  Administered 2020-08-24: 2 mg via INTRAVENOUS
  Administered 2020-08-24 – 2020-08-25 (×2): 4 mg via INTRAVENOUS
  Administered 2020-08-25: 2.5 mg via INTRAVENOUS
  Administered 2020-08-25: 5 mg via INTRAVENOUS
  Administered 2020-08-25 (×2): 3 mg via INTRAVENOUS
  Administered 2020-08-25: 0.5 mg via INTRAVENOUS
  Administered 2020-08-26: 7 mg via INTRAVENOUS
  Administered 2020-08-26: 6 mg via INTRAVENOUS
  Administered 2020-08-26: 1 mg via INTRAVENOUS
  Filled 2020-08-24 (×2): qty 30

## 2020-08-24 MED ORDER — VITAMIN D3 50 MCG (2000 UT) PO TABS: 1.0000 | ORAL_TABLET | Freq: Every day | ORAL | Status: DC

## 2020-08-24 MED ORDER — DIPHENHYDRAMINE HCL 50 MG/ML IJ SOLN
25.0000 mg | Freq: Once | INTRAMUSCULAR | Status: AC
  Administered 2020-08-25: 25 mg via INTRAVENOUS
  Filled 2020-08-24: qty 1

## 2020-08-24 MED ORDER — POLYETHYLENE GLYCOL 3350 17 G PO PACK: 17.0000 g | PACK | Freq: Every day | ORAL | Status: DC | PRN

## 2020-08-24 MED ORDER — MIRTAZAPINE 15 MG PO TABS: 7.5000 mg | ORAL_TABLET | Freq: Every day | ORAL | Status: DC

## 2020-08-24 MED ORDER — ALLOPURINOL 100 MG PO TABS: 100.0000 mg | ORAL_TABLET | Freq: Every day | ORAL | Status: DC

## 2020-08-24 MED ORDER — OXYCODONE HCL 5 MG PO TABS
20.0000 mg | ORAL_TABLET | Freq: Once | ORAL | Status: AC
  Administered 2020-08-24: 20 mg via ORAL
  Filled 2020-08-24: qty 4

## 2020-08-24 MED ORDER — SODIUM BICARBONATE 650 MG PO TABS
1950.0000 mg | ORAL_TABLET | Freq: Three times a day (TID) | ORAL | Status: DC
  Administered 2020-08-25 – 2020-08-26 (×5): 1950 mg via ORAL
  Filled 2020-08-24 (×5): qty 3

## 2020-08-24 MED ORDER — SENNOSIDES-DOCUSATE SODIUM 8.6-50 MG PO TABS
1.0000 | ORAL_TABLET | Freq: Two times a day (BID) | ORAL | Status: DC
  Administered 2020-08-25: 1 via ORAL
  Filled 2020-08-24 (×3): qty 1

## 2020-08-24 MED ORDER — SODIUM CHLORIDE 0.9% FLUSH: 9.0000 mL | INTRAVENOUS | Status: DC | PRN

## 2020-08-24 MED ORDER — HYDROXYUREA 500 MG PO CAPS
500.0000 mg | ORAL_CAPSULE | Freq: Every day | ORAL | Status: DC
  Administered 2020-08-25 – 2020-08-26 (×2): 500 mg via ORAL
  Filled 2020-08-24 (×2): qty 1

## 2020-08-24 MED ORDER — SODIUM CHLORIDE 0.9% IV SOLUTION: Freq: Once | INTRAVENOUS | Status: DC

## 2020-08-24 MED ORDER — DEFERIPRONE (TWICE DAILY) 1000 MG PO TABS: 1000.0000 mg | ORAL_TABLET | Freq: Two times a day (BID) | ORAL | Status: DC

## 2020-08-24 MED ORDER — DIPHENHYDRAMINE HCL 25 MG PO CAPS
25.0000 mg | ORAL_CAPSULE | ORAL | Status: DC | PRN
  Administered 2020-08-24: 25 mg via ORAL
  Filled 2020-08-24: qty 1

## 2020-08-24 MED ORDER — VOXELOTOR 500 MG PO TABS: 1500.0000 mg | ORAL_TABLET | Freq: Every day | ORAL | Status: DC

## 2020-08-24 MED ORDER — ACETAMINOPHEN 325 MG PO TABS
650.0000 mg | ORAL_TABLET | Freq: Once | ORAL | Status: AC
  Administered 2020-08-25: 650 mg via ORAL
  Filled 2020-08-24: qty 2

## 2020-08-24 MED ORDER — SODIUM CHLORIDE 0.45 % IV SOLN: INTRAVENOUS | Status: DC

## 2020-08-24 MED ORDER — FOLIC ACID 1 MG PO TABS
1.0000 mg | ORAL_TABLET | Freq: Every day | ORAL | Status: DC
  Administered 2020-08-25 – 2020-08-26 (×2): 1 mg via ORAL
  Filled 2020-08-24 (×2): qty 1

## 2020-08-24 MED ORDER — SODIUM ZIRCONIUM CYCLOSILICATE 10 G PO PACK
10.0000 g | PACK | ORAL | Status: DC
  Administered 2020-08-26: 10 g via ORAL
  Filled 2020-08-24: qty 1

## 2020-08-24 MED ORDER — OXYCODONE HCL ER 15 MG PO T12A
15.0000 mg | EXTENDED_RELEASE_TABLET | Freq: Two times a day (BID) | ORAL | Status: DC
  Administered 2020-08-25 – 2020-08-26 (×4): 15 mg via ORAL
  Filled 2020-08-24 (×4): qty 1

## 2020-08-24 MED ORDER — HYDROMORPHONE HCL 2 MG/ML IJ SOLN
2.0000 mg | INTRAMUSCULAR | Status: AC
  Administered 2020-08-24 (×3): 2 mg via INTRAVENOUS
  Filled 2020-08-24 (×3): qty 1

## 2020-08-24 MED ORDER — NALOXONE HCL 0.4 MG/ML IJ SOLN: 0.4000 mg | INTRAMUSCULAR | Status: DC | PRN

## 2020-08-24 NOTE — Progress Notes (Signed)
Pt admitted to the day hospital for treatment of sickle cell pain crisis. Pt reported pain to bilateral legs 10/10. Pt given PO Benadryl and Tylenol and hydrated with IV fluids. Pt received Dilaudid IVP x3 with an hour in between doses. Pt received '20mg'$  Oxycodone. Critical lab value reported to [place] FNP , Hgb 6.3 and Creatinine 3.97. Pain not adequately controlled in day hospital, pt reports pain 7/10.  Orders placed for hospital admissions, pt made aware, verbalized understanding. Report called to [employer]. Pt taken by staff in wheelchair to [facility]. Pt declined AVS. Pt alert , oriented and VSS at hospital transfer.

## 2020-08-24 NOTE — Progress Notes (Signed)
Critical Value  Test result: Hemoglobin 6.3  Time and Date notified: 08/24/20 at 11:24 am  Provider notified: [place], [place], [facility]  Action taken:  No new orders

## 2020-08-24 NOTE — Telephone Encounter (Signed)
Patient called in. Complains of pain in legs bilateral  rates 10/10. Denied chest pain, abd pain, fever, N/V/D, or priapism. Wants to come in for treatment. Last took '10mg'$  Oyxcontin and '15mg'$  Oxycodone at 6:00am. Pt states he has not been in the ED recently. Denies exposure to anyone covid positive in last two weeks, denies any flu like symptoms today. Pt states his Mother is his transport today. [place] FNP made aware, approved for pt to be seen in day hospital today. Pt notified, verbalized understanding.

## 2020-08-24 NOTE — H&P (Signed)
H&P  Patient Demographics:  [name] [name], is a 40 y.o. male  MRN: [id number]   DOB - [birth year]  Admit Date - 08/24/2020  Outpatient Primary MD for the patient is [name], MD      HPI:   [name] [name]  is a 40 y.o. male with a medical history significant for sickle cell disease type SS, chronic pain syndrome, opiate dependence and tolerance, history of polysubstance abuse, immune complex glomerulonephritis, stage IV chronic kidney disease, and history of anemia of chronic disease presented to sickle cell day infusion center for bilateral lower extremity pain.  Patient is complaining of bilateral lower extremity pain, primarily around his knees over the past several days.  He says that pain has not been relieved by his home medications.  Patient has been taking OxyContin and oxycodone consistently over the past several days.  He has not identified any provocative factors concerning current crisis.  He denies headache, chest pain, shortness of breath, urinary symptoms, or fatigue.  No dizziness, paresthesias, nausea, vomiting, or diarrhea.  No sick contacts, recent travel, or exposure to COVID-19.  Sickle cell day infusion center course: Complete blood count shows: WBCs 14.2, hemoglobin 6.3, and platelets 346,000.  Absolute reticulocytes 146.5.  Complete blood count shows sodium 136, potassium 4.9, chloride 104, creatinine 3.97, ALP 227, and GFR 19. Vital signs show: BP (!) 96/54 (BP Location: Right Arm)   Pulse 80   Temp 97.7 F (36.5 C) (Temporal)   Resp 17   SpO2 97%     Review of systems:   Review of Systems  Constitutional:  Negative for chills, fever and malaise/fatigue.  HENT: Negative.    Eyes: Negative.   Respiratory: Negative.    Cardiovascular: Negative.   Gastrointestinal: Negative.   Genitourinary: Negative.  Negative for frequency.  Musculoskeletal:  Positive for back pain and joint pain.  Skin: Negative.   Neurological: Negative.  Negative for  weakness.  Psychiatric/Behavioral: Negative.      With Past History of the following :   Past Medical History:  Diagnosis Date   Abscess of right iliac fossa 09/24/2018   required Perc Drain 09/24/2018   Arachnoid Cyst of brain bilaterally    "2 really small ones in the back of my head; inside; saw them w/MRI" (09/25/2012)   Bacterial pneumonia ~ 2012   "caught it here in the hospital" (09/25/2012)   Chronic kidney disease    "from my sickle cell" (09/25/2012)   CKD (chronic kidney disease), stage II    Colitis 04/19/2017   CT scan abd/ pelvis   GERD (gastroesophageal reflux disease)    "after I eat alot of spicey foods" (09/25/2012)   Gynecomastia, male 07/10/2012   History of blood transfusion    "always related to sickle cell crisis" (09/25/2012)   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   Noted in noted from Hematology notes at [facility]    "take RX qd to prevent them" (09/25/2012)   Opioid dependence with withdrawal ([place]) 08/30/2012   Renal insufficiency    Sickle cell anemia (HCC)    Sickle cell crisis ([place]) 09/25/2012   Sickle cell nephropathy ([place]) 07/10/2012   Sinus tachycardia    Tachycardia with heart rate 121-140 beats per minute with ambulation 08/04/2016      Past Surgical History:  Procedure Laterality Date   ABCESS DRAINAGE     CHOLECYSTECTOMY  ~ 2012   COLONOSCOPY N/A 04/23/2017   Procedure: COLONOSCOPY;  Surgeon: Irene Shipper, MD;  Location: [facility];  Service: Endoscopy;  Laterality: N/A;   IR FLUORO GUIDE CV LINE RIGHT  12/17/2016   IR RADIOLOGIST EVAL & MGMT  10/02/2018   IR RADIOLOGIST EVAL & MGMT  10/15/2018   IR REMOVAL TUN CV CATH W/O FL  12/21/2016   IR US GUIDE VASC ACCESS RIGHT  12/17/2016   spleenectomy       Social History:   Social History   Tobacco Use   Smoking status: Some Days    Packs/day: 0.10    Pack years: 0.00    Types: Cigarettes   Smokeless tobacco: Never   Tobacco comments:    09/25/2012 "I don't buy  cigarettes; bum one from friends q now and then"  Substance Use Topics   Alcohol use: Yes    Alcohol/week: 0.0 standard drinks    Comment: Once a month     Lives - At home   Family History :   Family History  Problem Relation Age of Onset   Breast cancer Mother      Home Medications:   Prior to Admission medications   Medication Sig Start Date End Date Taking? Authorizing Provider  acetaminophen (TYLENOL) 650 MG CR tablet Take 650 mg by mouth every 8 (eight) hours as needed for pain.   Yes [provider]  Deferiprone, Twice Daily, (FERRIPROX TWICE-A-DAY) 1000 MG TABS Take 1,000 mg by mouth 2 (two) times daily.   Yes [provider]  folic acid (FOLVITE) 1 MG tablet TAKE 1 TABLET BY MOUTH EVERY DAY 03/22/20  Yes [name], FNP  hydroxyurea (HYDREA) 500 MG capsule Take 500 mg by mouth daily. 05/07/20  Yes [provider]  LOKELMA 10 g PACK packet Take 10 g by mouth every other day. 03/18/20  Yes [provider]  OXBRYTA 500 MG TABS tablet Take 1,500 mg by mouth daily. 03/26/20  Yes [provider]  oxyCODONE (OXYCONTIN) 15 mg 12 hr tablet Take 1 tablet (15 mg total) by mouth every 12 (twelve) hours. 08/20/20  Yes [name], FNP  Oxycodone HCl 10 MG TABS Take 1 tablet (10 mg total) by mouth every 4 (four) hours as needed. 08/20/20  Yes [name], FNP  sodium bicarbonate 650 MG tablet Take 1,950 mg by mouth 3 (three) times daily. 11/07/18  Yes [provider]  allopurinol (ZYLOPRIM) 100 MG tablet Take 100 mg by mouth daily. 02/12/20   [provider]  Cholecalciferol (VITAMIN D3) 50 MCG (2000 UT) TABS Take 1 each by mouth daily. Patient not taking: Reported on 07/30/2020 01/13/20   [name], FNP  mirtazapine (REMERON) 7.5 MG tablet Take 1 tablet (7.5 mg total) by mouth at bedtime. 08/12/20   [name], FNP     Allergies:   Allergies  Allergen Reactions   Nsaids Other (See Comments)     Kidney disease   Morphine And Related Other (See Comments)    "Real bad headaches"      Physical Exam:   Vitals:   Vitals:   08/24/20 1417 08/24/20 1450  BP:  (!) 96/54  Pulse:  80  Resp: 12 17  Temp:  97.7 F (36.5 C)  SpO2: 97% 97%    Physical Exam: Constitutional: Patient appears well-developed and well-nourished. Not in obvious distress. HENT: Normocephalic, atraumatic, External right and left ear normal. Oropharynx is clear and moist.  Eyes: Conjunctivae and EOM are normal. PERRLA.  Scleral icterus Neck: Normal ROM. Neck supple. No JVD. No tracheal  deviation. No thyromegaly. CVS: RRR, S1/S2 +, no murmurs, no gallops, no carotid bruit.  Pulmonary: Effort and breath sounds normal, no stridor, rhonchi, wheezes, rales.  Abdominal: Soft. BS +, no distension, tenderness, rebound or guarding.  Musculoskeletal: Normal range of motion. No edema and no tenderness.  Lymphadenopathy: No lymphadenopathy noted, cervical, inguinal or axillary Neuro: Alert. Normal reflexes, muscle tone coordination. No cranial nerve deficit. Skin: Skin is warm and dry. No rash noted. Not diaphoretic. No erythema. No pallor. Psychiatric: Normal mood and affect. Behavior, judgment, thought content normal.   Data Review:   CBC Recent Labs  Lab 08/24/20 1015  WBC 14.2*  HGB 6.3*  HCT 18.6*  PLT 346  MCV 112.7*  MCH 38.2*  MCHC 33.9  RDW Not Measured  LYMPHSABS 6.2*  MONOABS 1.1*  EOSABS 0.3  BASOSABS 0.0   ------------------------------------------------------------------------------------------------------------------  Chemistries  Recent Labs  Lab 08/24/20 1015  NA 136  K 4.9  CL 104  CO2 24  GLUCOSE 82  BUN 36*  CREATININE 3.97*  CALCIUM 8.0*  AST 40  ALT 27  ALKPHOS 227*  BILITOT 1.1   ------------------------------------------------------------------------------------------------------------------ estimated creatinine clearance is 20.3 mL/min (A) (by C-G formula based on  SCr of 3.97 mg/dL (H)). ------------------------------------------------------------------------------------------------------------------ No results for input(s): TSH, T4TOTAL, T3FREE, THYROIDAB in the last 72 hours.  Invalid input(s): FREET3  Coagulation profile No results for input(s): INR, PROTIME in the last 168 hours. ------------------------------------------------------------------------------------------------------------------- No results for input(s): DDIMER in the last 72 hours. -------------------------------------------------------------------------------------------------------------------  Cardiac Enzymes No results for input(s): CKMB, TROPONINI, MYOGLOBIN in the last 168 hours.  Invalid input(s): CK ------------------------------------------------------------------------------------------------------------------ No results found for: BNP  ---------------------------------------------------------------------------------------------------------------  Urinalysis    Component Value Date/Time   COLORURINE YELLOW 07/01/2020 [place] 07/01/2020 1138   LABSPEC 1.008 07/01/2020 1138   PHURINE 8.0 07/01/2020 1138   GLUCOSEU 150 (A) 07/01/2020 1138   HGBUR SMALL (A) 07/01/2020 1138   HGBUR small 03/10/2010 1445   BILIRUBINUR NEGATIVE 07/01/2020 1138   BILIRUBINUR neg 06/15/2020 1515   KETONESUR NEGATIVE 07/01/2020 1138   PROTEINUR 100 (A) 07/01/2020 1138   UROBILINOGEN 0.2 06/15/2020 1515   UROBILINOGEN 0.2 05/11/2017 1335   NITRITE NEGATIVE 07/01/2020 1138   LEUKOCYTESUR NEGATIVE 07/01/2020 1138    ----------------------------------------------------------------------------------------------------------------   Imaging Results:    No results found.   Assessment & Plan:  Principal Problem:   Sickle cell pain crisis (HCC) Active Problems:   TOBACCO ABUSE   Leukocytosis   Acute kidney injury superimposed on chronic kidney disease (HCC)    Chronic pain syndrome  Sickle cell disease with pain crisis:  Admit to MedSurg, continue IV fluid, 0.45% saline at 100 mL/h Initiate IV Dilaudid PCA with settings of 0.5 mg, 10-minute lockout, and 3 mg/h OxyContin 15 mg every 12 hours Oxycodone 10 mg every 6 hours as needed for severe breakthrough pain Hold Toradol, contraindicated Monitor vital signs very closely, reevaluate pain scale regularly, and supplemental oxygen as needed. Patient will be reevaluated for pain in the context of function and relationship to baseline as care progresses.  Acute kidney injury superimposed on CKD IV: There has been an upward trend in creatinine level. Today, creatinine is 3.97.  Continue gentile hydration. Avoid all nephrotoxins.  Continue home medications Initiate renal diet  Chronic pain syndrome: Continue home medications  Anemia of chronic disease: Today, patient's hemoglobin is 6.3 which is below his baseline.  Transfuse 1 unit PRBCs. Continue Oxbryta, hydroxyurea, and folic acid. Follow CBC in a.m.  Leukocytosis: WBCs  14.2.  Patient is afebrile without signs of acute infection.  COVID-19 test pending.  Urinalysis and urine culture pending.  Continue to follow closely without antibiotics.  Labs in AM.  Tobacco dependence: Patient counseled extensively on the dangers of smoking especially in the setting of sickle cell disease.  Patient states that he primarily smokes Black and mild cigars.  He declines nicotine patch on today     DVT Prophylaxis: SCDs  AM Labs Ordered, also please review Full Orders  Family Communication: Admission, patient's condition and plan of care including tests being ordered have been discussed with the patient who indicate understanding and agree with the plan and Code Status.  Code Status: Full Code  Consults called: None    Admission status: Inpatient    Time spent in minutes : 35 minutes  [place], MSN, FNP-C [facility] [address] [place], [place] [address] [phone]  08/24/2020 at 3:57 PM

## 2020-08-24 NOTE — H&P (Signed)
[facility] History and Physical   Date: 08/24/2020  Patient name: [name] [name]. Medical record number: [id number] Date of birth: 1981/01/17 Age: 40 y.o. Gender: male PCP: [name], MD  Attending physician: [name], MD  Chief Complaint: Sickle cell pain   History of Present Illness: [name] [name] is a 40 year old male with a medical history significant for sickle cell disease, chronic pain syndrome, opiate dependence and tolerance, history of polysubstance abuse, history of stage IV chronic kidney disease, and history of anemia of chronic disease presents with complaints of bilateral knee pain that is consistent with previous sickle cell pain crisis.  Patient states that knee pain has been worsening over the past 2 days.  Patient has chronic knee pain and is under the care of orthopedic specialist and received steroid injection to knees bilaterally 1 month ago.  He states that he typically has knee pain daily, however it has been worsening and it has been difficult to stand.  He rates pain as 10/10 characterized as constant and aching.  He has not identified any provocative factors concerning current crisis.  Patient last had OxyContin and oxycodone this a.m. without sustained relief.  He denies any fever, chills, or chest pain.  No sick contacts, recent travel, or exposure to COVID-19.  No shortness of breath, urinary symptoms, nausea, vomiting, or diarrhea.  Meds: Medications Prior to Admission  Medication Sig Dispense Refill Last Dose   acetaminophen (TYLENOL) 650 MG CR tablet Take 650 mg by mouth every 8 (eight) hours as needed for pain.   08/24/2020   Deferiprone, Twice Daily, (FERRIPROX TWICE-A-DAY) 1000 MG TABS Take 1,000 mg by mouth 2 (two) times daily.   [id number]   folic acid (FOLVITE) 1 MG tablet TAKE 1 TABLET BY MOUTH EVERY DAY 90 tablet 4 08/24/2020   hydroxyurea (HYDREA) 500 MG capsule Take 500 mg by mouth daily.   08/24/2020    LOKELMA 10 g PACK packet Take 10 g by mouth every other day.   08/24/2020   OXBRYTA 500 MG TABS tablet Take 1,500 mg by mouth daily.   08/24/2020   oxyCODONE (OXYCONTIN) 15 mg 12 hr tablet Take 1 tablet (15 mg total) by mouth every 12 (twelve) hours. 14 tablet 0 08/24/2020   Oxycodone HCl 10 MG TABS Take 1 tablet (10 mg total) by mouth every 4 (four) hours as needed. 42 tablet 0 08/24/2020   sodium bicarbonate 650 MG tablet Take 1,950 mg by mouth 3 (three) times daily.   08/24/2020   allopurinol (ZYLOPRIM) 100 MG tablet Take 100 mg by mouth daily.      Cholecalciferol (VITAMIN D3) 50 MCG (2000 UT) TABS Take 1 each by mouth daily. (Patient not taking: Reported on 07/30/2020) 30 tablet 11    mirtazapine (REMERON) 7.5 MG tablet Take 1 tablet (7.5 mg total) by mouth at bedtime. 15 tablet 1     Allergies: Nsaids and Morphine and related Past Medical History:  Diagnosis Date   Abscess of right iliac fossa 09/24/2018   required Perc Drain 09/24/2018   Arachnoid Cyst of brain bilaterally    "2 really small ones in the back of my head; inside; saw them w/MRI" (09/25/2012)   Bacterial pneumonia ~ 2012   "caught it here in the hospital" (09/25/2012)   Chronic kidney disease    "from my sickle cell" (09/25/2012)   CKD (chronic kidney disease), stage II    Colitis 04/19/2017   CT scan abd/ pelvis   GERD (  gastroesophageal reflux disease)    "after I eat alot of spicey foods" (09/25/2012)   Gynecomastia, male 07/10/2012   History of blood transfusion    "always related to sickle cell crisis" (09/25/2012)   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   Noted in noted from Hematology notes at [place]    "take RX qd to prevent them" (09/25/2012)   Opioid dependence with withdrawal ([place]) 08/30/2012   Renal insufficiency    Sickle cell anemia (HCC)    Sickle cell crisis ([place]) 09/25/2012   Sickle cell nephropathy ([place]) 07/10/2012   Sinus tachycardia    Tachycardia with heart rate 121-140  beats per minute with ambulation 08/04/2016   Past Surgical History:  Procedure Laterality Date   ABCESS DRAINAGE     CHOLECYSTECTOMY  ~ 2012   COLONOSCOPY N/A 04/23/2017   Procedure: COLONOSCOPY;  Surgeon: Irene Shipper, MD;  [facility];  Service: Endoscopy;  Laterality: N/A;   IR FLUORO GUIDE CV LINE RIGHT  12/17/2016   IR RADIOLOGIST EVAL & MGMT  10/02/2018   IR RADIOLOGIST EVAL & MGMT  10/15/2018   IR REMOVAL TUN CV CATH W/O FL  12/21/2016   IR US GUIDE VASC ACCESS RIGHT  12/17/2016   spleenectomy     Family History  Problem Relation Age of Onset   Breast cancer Mother    Social History   Socioeconomic History   Marital status: Single    Spouse name: Not on file   Number of children: Not on file   Years of education: Not on file   Highest education level: Not on file  Occupational History   Not on file  Tobacco Use   Smoking status: Some Days    Packs/day: 0.10    Pack years: 0.00    Types: Cigarettes   Smokeless tobacco: Never   Tobacco comments:    09/25/2012 "I don't buy cigarettes; bum one from friends q now and then"  Vaping Use   Vaping Use: Never used  Substance and Sexual Activity   Alcohol use: Yes    Alcohol/week: 0.0 standard drinks    Comment: Once a month   Drug use: Yes    Types: Marijuana    Comment: Every 2-3 weeks    Sexual activity: Yes  Other Topics Concern   Not on file  Social History Narrative    Lives alone in [place].   Back at school, studying Public relations account executive. Unemployed.    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    Social Determinants of Health   Financial Resource Strain: Low Risk    Difficulty of Paying Living Expenses: Not hard at all  Food Insecurity: No Food Insecurity   Worried About Charity fundraiser in the Last Year: Never true   Arboriculturist in the Last Year:  Never true  Transportation Needs: Unmet Transportation Needs   Lack of Transportation (Medical): Yes   Lack of Transportation (Non-Medical): Yes  Physical Activity: Inactive   Days of Exercise per Week: 0 days   Minutes of Exercise per Session: 0 min  Stress: Stress Concern Present   Feeling of Stress : To some extent  Social Connections: Socially Isolated   Frequency of Communication with Friends and Family: More than three times a week   Frequency of Social Gatherings with Friends  and Family: More than three times a week   Attends Religious Services: Never   Active Member of Clubs or Organizations: No   Attends Archivist Meetings: Never   Marital Status: Divorced  Human resources officer Violence: Not At Risk   Fear of Current or Ex-Partner: No   Emotionally Abused: No   Physically Abused: No   Sexually Abused: No   Review of Systems  Constitutional:  Negative for chills and fever.  HENT: Negative.    Eyes: Negative.   Respiratory: Negative.    Cardiovascular: Negative.   Genitourinary: Negative.   Musculoskeletal:  Positive for back pain and joint pain.  Skin: Negative.   Neurological: Negative.   Psychiatric/Behavioral: Negative.     Physical Exam: Blood pressure (!) 96/54, pulse 80, temperature 97.7 F (36.5 C), temperature source Temporal, resp. rate 17, SpO2 97 %. Physical Exam Constitutional:      Appearance: Normal appearance.  Eyes:     Pupils: Pupils are equal, round, and reactive to light.  Cardiovascular:     Rate and Rhythm: Normal rate and regular rhythm.     Pulses: Normal pulses.  Pulmonary:     Effort: Pulmonary effort is normal.  Abdominal:     General: Abdomen is flat. Bowel sounds are normal.  Musculoskeletal:        General: Normal range of motion.  Skin:    General: Skin is warm.  Neurological:     General: No focal deficit present.     Mental Status: He is alert. Mental status is at baseline.  Psychiatric:        Mood and Affect:  Mood normal.        Behavior: Behavior normal.        Thought Content: Thought content normal.        Judgment: Judgment normal.     Lab results: Results for orders placed or performed during the hospital encounter of 08/24/20 (from the past 24 hour(s))  CBC WITH DIFFERENTIAL     Status: Abnormal   Collection Time: 08/24/20 10:15 AM  Result Value Ref Range   WBC 14.2 (H) 4.0 - 10.5 K/uL   RBC 1.65 (L) 4.22 - 5.81 MIL/uL   Hemoglobin 6.3 (LL) 13.0 - 17.0 g/dL   HCT 18.6 (L) 39.0 - 52.0 %   MCV 112.7 (H) 80.0 - 100.0 fL   MCH 38.2 (H) 26.0 - 34.0 pg   MCHC 33.9 30.0 - 36.0 g/dL   RDW Not Measured 11.5 - 15.5 %   Platelets 346 150 - 400 K/uL   nRBC 0.1 0.0 - 0.2 %   Neutrophils Relative % 46 %   Neutro Abs 6.5 1.7 - 7.7 K/uL   Lymphocytes Relative 44 %   Lymphs Abs 6.2 (H) 0.7 - 4.0 K/uL   Monocytes Relative 8 %   Monocytes Absolute 1.1 (H) 0.1 - 1.0 K/uL   Eosinophils Relative 2 %   Eosinophils Absolute 0.3 0.0 - 0.5 K/uL   Basophils Relative 0 %   Basophils Absolute 0.0 0.0 - 0.1 K/uL   Immature Granulocytes 0 %   Abs Immature Granulocytes 0.06 0.00 - 0.07 K/uL   Reactive, Benign Lymphocytes PRESENT    Polychromasia PRESENT    Sickle Cells PRESENT   Reticulocytes     Status: Abnormal   Collection Time: 08/24/20 10:15 AM  Result Value Ref Range   Retic Ct Pct 8.9 (H) 0.4 - 3.1 %   RBC. 1.65 (L) 4.22 - 5.81 MIL/uL  Retic Count, Absolute 146.5 19.0 - 186.0 K/uL   Immature Retic Fract 22.7 (H) 2.3 - 15.9 %  Comprehensive metabolic panel     Status: Abnormal   Collection Time: 08/24/20 10:15 AM  Result Value Ref Range   Sodium 136 135 - 145 mmol/L   Potassium 4.9 3.5 - 5.1 mmol/L   Chloride 104 98 - 111 mmol/L   CO2 24 22 - 32 mmol/L   Glucose, Bld 82 70 - 99 mg/dL   BUN 36 (H) 6 - 20 mg/dL   Creatinine, Ser 3.97 (H) 0.61 - 1.24 mg/dL   Calcium 8.0 (L) 8.9 - 10.3 mg/dL   Total Protein 5.9 (L) 6.5 - 8.1 g/dL   Albumin 2.5 (L) 3.5 - 5.0 g/dL   AST 40 15 - 41 U/L    ALT 27 0 - 44 U/L   Alkaline Phosphatase 227 (H) 38 - 126 U/L   Total Bilirubin 1.1 0.3 - 1.2 mg/dL   GFR, Estimated 19 (L) >60 mL/min   Anion gap 8 5 - 15    Imaging results:  No results found.   Assessment & Plan: Patient admitted to sickle cell day infusion center for management of pain crisis.  Patient is opiate tolerant Initiate IV dilaudid 2 mg x 3 IV fluids, 0.45% saline at 150 mg/hr Tylenol 1000 mg by mouth times one dose Review CBC with differential, complete metabolic panel, and reticulocytes as results become available. Baseline hemoglobin is Pain intensity will be reevaluated in context of functioning and relationship to baseline as care progresses If pain intensity remains elevated and/or sudden change in hemodynamic stability transition to inpatient services for higher level of care.   [name]  APRN, MSN, FNP-C [facility] [address] [place], [place] [address] [phone]   08/24/2020, 3:47 PM

## 2020-08-25 ENCOUNTER — Other Ambulatory Visit: Payer: Self-pay

## 2020-08-25 LAB — BASIC METABOLIC PANEL
Anion gap: 6 (ref 5–15)
BUN: 31 mg/dL — ABNORMAL HIGH (ref 6–20)
CO2: 24 mmol/L (ref 22–32)
Calcium: 8.3 mg/dL — ABNORMAL LOW (ref 8.9–10.3)
Chloride: 106 mmol/L (ref 98–111)
Creatinine, Ser: 3.19 mg/dL — ABNORMAL HIGH (ref 0.61–1.24)
GFR, Estimated: 25 mL/min — ABNORMAL LOW (ref >60)
Glucose, Bld: 86 mg/dL (ref 70–99)
Potassium: 5.5 mmol/L — ABNORMAL HIGH (ref 3.5–5.1)
Sodium: 136 mmol/L (ref 135–145)

## 2020-08-25 LAB — CBC
HCT: 26.2 % — ABNORMAL LOW (ref 39.0–52.0)
Hemoglobin: 9.2 g/dL — ABNORMAL LOW (ref 13.0–17.0)
MCH: 34.7 pg — ABNORMAL HIGH (ref 26.0–34.0)
MCHC: 35.1 g/dL (ref 30.0–36.0)
MCV: 98.9 fL (ref 80.0–100.0)
Platelets: 203 10*3/uL (ref 150–400)
RBC: 2.65 MIL/uL — ABNORMAL LOW (ref 4.22–5.81)
WBC: 13.1 10*3/uL — ABNORMAL HIGH (ref 4.0–10.5)
nRBC: 0 % (ref 0.0–0.2)

## 2020-08-25 LAB — URINALYSIS, COMPLETE (UACMP) WITH MICROSCOPIC
Bacteria, UA: NONE SEEN
Bilirubin Urine: NEGATIVE
Glucose, UA: 50 mg/dL — AB
Hgb urine dipstick: NEGATIVE
Ketones, ur: NEGATIVE mg/dL
Leukocytes,Ua: NEGATIVE
Nitrite: NEGATIVE
Protein, ur: 100 mg/dL — AB
Specific Gravity, Urine: 1.006 (ref 1.005–1.030)
pH: 9 — ABNORMAL HIGH (ref 5.0–8.0)

## 2020-08-25 LAB — HIV ANTIBODY (ROUTINE TESTING W REFLEX): HIV Screen 4th Generation wRfx: NONREACTIVE

## 2020-08-25 MED ORDER — NICOTINE 21 MG/24HR TD PT24
21.0000 mg | MEDICATED_PATCH | Freq: Every day | TRANSDERMAL | Status: DC
  Administered 2020-08-25: 21 mg via TRANSDERMAL
  Filled 2020-08-25 (×2): qty 1

## 2020-08-25 NOTE — Progress Notes (Signed)
Patient stated he needed to get outside to smoke.  I offered patient to call his physician to see if they would be willing to order him a nicotine patch.  Patient stated "I don't need a nicotine patch, that's not going to help.  I'm going out to smoke."  I then educated patient that his is a smoke free campus and he could not leave the department without a physician's order and leaving with pump and smoking on campus could cause him to be sent home.  Patient verbalized understanding.

## 2020-08-25 NOTE — Progress Notes (Signed)
Pt previously triggered yellow MEWS protocol r/t decreased respirations. Pt currently maintaining RR at 15 breaths per minute. Will continue to monitor.     08/25/20 [id number]  Assess: MEWS Score  Resp 15  Level of Consciousness Alert  Assess: MEWS Score  MEWS Temp 0  MEWS Systolic 1  MEWS Pulse 0  MEWS RR 0  MEWS LOC 0  MEWS Score 1  MEWS Score Color Green  Assess: if the MEWS score is Yellow or Red  Were vital signs taken at a resting state? Yes  Focused Assessment No change from prior assessment  Does the patient meet 2 or more of the SIRS criteria? No  MEWS guidelines implemented *See Row Information* Yes  Treat  MEWS Interventions Administered scheduled meds/treatments  Pain Scale 0-10  Pain Score 7  Pain Type Acute pain  Pain Location Leg  Pain Orientation Right;Left  Pain Descriptors / Indicators Aching;Constant  Pain Frequency Constant  Pain Onset On-going  Patients Stated Pain Goal 4  Pain Intervention(s) Medication (See eMAR);PCA encouraged  Multiple Pain Sites No  Take Vital Signs  Increase Vital Sign Frequency  Yellow: Q 2hr X 2 then Q 4hr X 2, if remains yellow, continue Q 4hrs  Escalate  MEWS: Escalate Yellow: discuss with charge nurse/RN and consider discussing with provider and RRT  Notify: Charge Nurse/RN  Name of Charge Nurse/RN Notified Keiva RN  Date Charge Nurse/RN Notified 08/25/20  Time Charge Nurse/RN Notified [phone]  Document  Patient Outcome Stabilized after interventions  Progress note created (see row info) Yes  Assess: SIRS CRITERIA  SIRS Temperature  0  SIRS Pulse 0  SIRS Respirations  0  SIRS WBC 0  SIRS Score Sum  0  MEWS Guidelines - (patients age 53 and over)

## 2020-08-25 NOTE — Plan of Care (Signed)
Pt received one unit of blood; tolerated procedure well until new IV line (right forearm) started to badly hurt; PCA pump stopped and transfusion transferred to PCA IV line; NP Olena Heckle aware and agreed with the change; pt was able to comfortably sleep; no s/s of acute distress or pain reported or observed; call light within reach and bed at lowest position for safety.

## 2020-08-25 NOTE — Progress Notes (Addendum)
Subjective: [name] [name] is a 40 year old male with a medical history significant for sickle cell disease type S, chronic pain syndrome, opiate dependence and tolerance, history of polysubstance abuse, immune complex glomerulonephritis, stage IV chronic kidney disease, and history of anemia of chronic disease was admitted for sickle cell pain crisis.  Patient says that lower extremity pain has improved some overnight.  He continues to have pain primarily to bilateral knees.  He endorses some swelling.  He denies headache, chest pain, urinary symptoms, nausea, vomiting, or diarrhea.  Patient is s/p 1 unit PRBCs.    Objective:  Vital signs in last 24 hours:  Vitals:   08/25/20 1044 08/25/20 1045 08/25/20 1213 08/25/20 1509  BP: 109/73 109/73    Pulse: 84 77    Resp: '15 15 15   '$ Temp: 98.1 F (36.7 C) 98.1 F (36.7 C)    TempSrc: Oral Oral    SpO2: 96% 96% 96%   Weight:    56 kg  Height:    '5\' 3"'$  (1.6 m)    Intake/Output from previous day:   Intake/Output Summary (Last 24 hours) at 08/25/2020 1600 Last data filed at 08/25/2020 1021 Gross per 24 hour  Intake 2813.88 ml  Output 2650 ml  Net 163.88 ml    Physical Exam: General: Alert, awake, oriented x3, in no acute distress.  HEENT: [place]/AT PEERL, EOMI Neck: Trachea midline,  no masses, no thyromegal,y no JVD, no carotid bruit OROPHARYNX:  Moist, No exudate/ erythema/lesions.  Heart: Regular rate and rhythm, without murmurs, rubs, gallops, PMI non-displaced, no heaves or thrills on palpation.  Lungs: Clear to auscultation, no wheezing or rhonchi noted. No increased vocal fremitus resonant to percussion  Abdomen: Soft, nontender, nondistended, positive bowel sounds, no masses no hepatosplenomegaly noted..  Neuro: No focal neurological deficits noted cranial nerves II through XII grossly intact. DTRs 2+ bilaterally upper and lower extremities. Strength 5 out of 5 in bilateral upper and lower extremities. Musculoskeletal: No warm  swelling or erythema around joints, no spinal tenderness noted. Psychiatric: Patient alert and oriented x3, good insight and cognition, good recent to remote recall. Lymph node survey: No cervical axillary or inguinal lymphadenopathy noted.  Lab Results:  Basic Metabolic Panel:    Component Value Date/Time   NA 136 08/25/2020 1327   NA 135 07/14/2020 1005   K 5.5 (H) 08/25/2020 1327   CL 106 08/25/2020 1327   CO2 24 08/25/2020 1327   BUN 31 (H) 08/25/2020 1327   BUN 35 (H) 07/14/2020 1005   CREATININE 3.19 (H) 08/25/2020 1327   CREATININE 1.45 (H) 05/12/2014 1430   GLUCOSE 86 08/25/2020 1327   CALCIUM 8.3 (L) 08/25/2020 1327   CBC:    Component Value Date/Time   WBC 13.1 (H) 08/25/2020 0856   HGB 9.2 (L) 08/25/2020 0856   HGB 8.6 (L) 07/14/2020 1005   HCT 26.2 (L) 08/25/2020 0856   HCT 23.6 (L) 07/14/2020 1005   PLT 203 08/25/2020 0856   PLT 339 07/14/2020 1005   MCV 98.9 08/25/2020 0856   MCV 117 (H) 07/14/2020 1005   NEUTROABS 6.5 08/24/2020 1015   NEUTROABS 7.0 07/14/2020 1005   LYMPHSABS 6.2 (H) 08/24/2020 1015   LYMPHSABS 6.2 (H) 07/14/2020 1005   MONOABS 1.1 (H) 08/24/2020 1015   EOSABS 0.3 08/24/2020 1015   EOSABS 0.3 07/14/2020 1005   BASOSABS 0.0 08/24/2020 1015   BASOSABS 0.0 07/14/2020 1005    No results found for this or any previous visit (from the past 240 hour(s)).  Studies/Results: No results found.  Medications: Scheduled Meds:  sodium chloride   Intravenous Once   Deferiprone (Twice Daily)  1,000 mg Oral BID   folic acid  1 mg Oral Daily   HYDROmorphone   Intravenous Q4H   hydroxyurea  500 mg Oral Daily   oxyCODONE  15 mg Oral Q12H   senna-docusate  1 tablet Oral BID   sodium bicarbonate  1,950 mg Oral TID   [START ON 08/26/2020] sodium zirconium cyclosilicate  10 g Oral QODAY   voxelotor  1,500 mg Oral Daily   Continuous Infusions:  sodium chloride 100 mL/hr at 08/24/20 1759   PRN Meds:.naloxone **AND** sodium chloride flush,  polyethylene glycol  Consultants: None  Procedures: None  Antibiotics: None  Assessment/Plan: Principal Problem:   Sickle cell pain crisis ([place]) Active Problems:   TOBACCO ABUSE   Leukocytosis   Acute kidney injury superimposed on chronic kidney disease (HCC)   Chronic pain syndrome  Sickle cell disease with pain crisis: 0.45% saline at 75 mL/h Continue IV Dilaudid PCA, settings decreased Oxycodone 10 mg every 6 hours as needed for severe breakthrough pain Monitor vital signs very closely, reevaluate pain scale regularly, and supplemental oxygen as needed  Acute kidney injury superimposed on CKD stage IV: Creatinine level has improved some overnight.  Will continue gentle hydration.  Continue to avoid all nephrotoxins. Follow labs in AM.  Chronic pain syndrome: Continue home medications  Anemia of chronic disease: Patient's hemoglobin has improved to 9.3, he is s/p 1 unit PRBCs.  We will continue to follow closely.  Follow labs in AM.  Leukocytosis: Stable.  Patient continues to be afebrile without any signs of acute infection.  Continue to follow closely  Tobacco dependence: Patient counseled extensively on the dangers of smoking especially in the setting of sickle cell disease.  Code Status: Full Code Family Communication: N/A Disposition Plan: Not yet ready for discharge   [place], MSN, FNP-C Patient [facility] [address] [place], [place] [address] [phone]  If 5PM-8AM, please contact night-coverage.  08/25/2020, 4:00 PM  LOS: 1 day

## 2020-08-26 ENCOUNTER — Other Ambulatory Visit (HOSPITAL_COMMUNITY): Payer: Self-pay

## 2020-08-26 LAB — URINE CULTURE: Culture: NO GROWTH

## 2020-08-26 LAB — BASIC METABOLIC PANEL
Anion gap: 5 (ref 5–15)
BUN: 30 mg/dL — ABNORMAL HIGH (ref 6–20)
CO2: 21 mmol/L — ABNORMAL LOW (ref 22–32)
Calcium: 8.2 mg/dL — ABNORMAL LOW (ref 8.9–10.3)
Chloride: 109 mmol/L (ref 98–111)
Creatinine, Ser: 2.84 mg/dL — ABNORMAL HIGH (ref 0.61–1.24)
GFR, Estimated: 28 mL/min — ABNORMAL LOW (ref >60)
Glucose, Bld: 92 mg/dL (ref 70–99)
Potassium: 5 mmol/L (ref 3.5–5.1)
Sodium: 135 mmol/L (ref 135–145)

## 2020-08-26 LAB — CBC
HCT: 22.7 % — ABNORMAL LOW (ref 39.0–52.0)
Hemoglobin: 7.8 g/dL — ABNORMAL LOW (ref 13.0–17.0)
MCH: 35.1 pg — ABNORMAL HIGH (ref 26.0–34.0)
MCHC: 34.4 g/dL (ref 30.0–36.0)
MCV: 102.3 fL — ABNORMAL HIGH (ref 80.0–100.0)
Platelets: 275 10*3/uL (ref 150–400)
RBC: 2.22 MIL/uL — ABNORMAL LOW (ref 4.22–5.81)
WBC: 14.7 10*3/uL — ABNORMAL HIGH (ref 4.0–10.5)
nRBC: 0 % (ref 0.0–0.2)

## 2020-08-26 LAB — TYPE AND SCREEN
ABO/RH(D): O POS
Antibody Screen: NEGATIVE
Donor AG Type: NEGATIVE
Unit division: 0

## 2020-08-26 LAB — BPAM RBC
Blood Product Expiration Date: 202207282359
ISSUE DATE / TIME: 202207130207
Unit Type and Rh: 9500

## 2020-08-26 MED ORDER — OXYCODONE HCL 10 MG PO TABS
10.0000 mg | ORAL_TABLET | ORAL | 0 refills | Status: DC | PRN
  Filled 2020-08-26: qty 42, 7d supply, fill #0

## 2020-08-26 MED ORDER — OXYCODONE HCL ER 15 MG PO T12A
15.0000 mg | EXTENDED_RELEASE_TABLET | Freq: Two times a day (BID) | ORAL | 0 refills | Status: DC
  Filled 2020-08-26: qty 14, 7d supply, fill #0

## 2020-08-26 NOTE — Care Management Important Message (Signed)
Important Message  Patient Details IM Letter given to the Patient. Name: [name] [name]. MRN: [id number] Date of Birth: [birth year]   Medicare Important Message Given:  Yes     Kerin Salen 08/26/2020, 10:48 AM

## 2020-08-26 NOTE — Discharge Summary (Signed)
Physician Discharge Summary  [name] [name]. [id number] DOB: Jan 20, 1981 DOA: 08/24/2020  PCP: [name] Garter, MD  Admit date: 08/24/2020  Discharge date: 08/27/2020  Discharge Diagnoses:  Principal Problem:   Sickle cell pain crisis ([place]) Active Problems:   TOBACCO ABUSE   Leukocytosis   Acute kidney injury superimposed on chronic kidney disease (HCC)   Chronic pain syndrome   Discharge Condition: Stable  Disposition:   Follow-up Information     [name] Garter, MD Follow up.   Specialty: Internal Medicine Contact information: [place] [address] [phone]         [name] Dresser, MD .   Specialty: Cardiology Contact information: [address] [place] [address] [phone]                Pt is discharged home in good condition and is to follow up with [name] Garter, MD this week to have labs evaluated. [name] A Hagmann Jr. is instructed to increase activity slowly and balance with rest for the next few days, and use prescribed medication to complete treatment of pain  Diet: Regular Wt Readings from Last 3 Encounters:  08/25/20 56 kg  08/23/20 56.2 kg  08/01/20 54.6 kg    History of present illness:  [name] [name]  is a 40 y.o. male with a medical history significant for sickle cell disease type SS, chronic pain syndrome, opiate dependence and tolerance, history of polysubstance abuse, immune complex glomerulonephritis, stage IV chronic kidney disease, and history of anemia of chronic disease presented to sickle cell day infusion center for bilateral lower extremity pain.  Patient is complaining of bilateral lower extremity pain, primarily around his knees over the past several days.  He says that pain has not been relieved by his home medications.  Patient has been taking OxyContin and oxycodone consistently over the past several days.  He has not identified any provocative  factors concerning current crisis.  He denies headache, chest pain, shortness of breath, urinary symptoms, or fatigue.  No dizziness, paresthesias, nausea, vomiting, or diarrhea.  No sick contacts, recent travel, or exposure to COVID-19.  Sickle cell day infusion center course: Complete blood count shows: WBCs 14.2, hemoglobin 6.3, and platelets 346,000.  Absolute reticulocytes 146.5.  Complete blood count shows sodium 136, potassium 4.9, chloride 104, creatinine 3.97, ALP 227, and GFR 19. Vital signs show: BP (!) 96/54 (BP Location: Right Arm)   Pulse 80   Temp 97.7 F (36.5 C) (Temporal)   Resp 17   SpO2 97%  Patient transition to inpatient care for sickle cell pain crisis  Hospital Course:  Sickle cell disease with pain crisis: Patient was admitted for sickle cell pain crisis and managed appropriately with IVF, IV Dilaudid via PCA as well as other adjunct therapies per sickle cell pain management protocols.  IV Dilaudid PCA weaned appropriately.  Patient's pain decreased to 3/10 and he is requesting discharge home.  Discussed home medications at length.  Patient advised to resume home medications. Acute kidney injury superimposed on CKD stage IV: It was noted that creatinine has been upward trending over the past several months.  On admission, creatinine was 3.97, which is above his baseline.  He was slightly dehydrated.  Renal function returned to baseline following gentle hydration.  Patient advised to continue to avoid nephrotoxins.  Also, patient is followed closely by nephrology.  Symptomatic anemia: On admission, hemoglobin was 6.3, which is below patient's baseline.  He was transfused  1 unit PRBCs.  Prior to discharge, hemoglobin 7.8.  Patient is followed closely by his hematology team.  He was recently started on Oxbryta and has continued on hydroxyurea. Advised to schedule first available appointment with his hematology team. Patient's medication regimen discussed at length.  He will  return to clinic on 08/31/2020 to repeat CBC with differential and complete metabolic panel.  Patient is aware of all upcoming appointments.  He is alert, oriented, and ambulating without assistance.  Patient was therefore discharged home today in a hemodynamically stable condition.   [name] [name] will follow-up with PCP within 1 week of this discharge. [name] [name] was counseled extensively about nonpharmacologic means of pain management, patient verbalized understanding and was appreciative of  the care received during this admission.   We discussed the need for good hydration, monitoring of hydration status, avoidance of heat, cold, stress, and infection triggers. We discussed the need to be adherent with taking Hydrea and other home medications. Patient was reminded of the need to seek medical attention immediately if any symptom of bleeding, anemia, or infection occurs.  Discharge Exam: Vitals:   08/26/20 0836 08/26/20 0948  BP:  109/74  Pulse:  98  Resp: 14 14  Temp:  98.2 F (36.8 C)  SpO2: 96% 94%   Vitals:   08/26/20 0449 08/26/20 0519 08/26/20 0836 08/26/20 0948  BP: 99/64   109/74  Pulse: 83   98  Resp: '14 14 14 14  '$ Temp:    98.2 F (36.8 C)  TempSrc:    Oral  SpO2: 93% 96% 96% 94%  Weight:      Height:        General appearance : Awake, alert, not in any distress. Speech Clear. Not toxic looking HEENT: Atraumatic and Normocephalic, pupils equally reactive to light and accomodation Neck: Supple, no JVD. No cervical lymphadenopathy.  Chest: Good air entry bilaterally, no added sounds  CVS: S1 S2 regular, no murmurs.  Abdomen: Bowel sounds present, Non tender and not distended with no gaurding, rigidity or rebound. Extremities: B/L Lower Ext shows no edema, both legs are warm to touch Neurology: Awake alert, and oriented X 3, CN II-XII intact, Non focal Skin: No Rash  Discharge Instructions  Discharge Instructions     Discharge patient   Complete by: As directed     Discharge disposition: 01-Home or Self Care   Discharge patient date: 08/26/2020      Allergies as of 08/26/2020       Reactions   Nsaids Other (See Comments)   Kidney disease   Morphine And Related Other (See Comments)   "Real bad headaches"         Medication List     STOP taking these medications    nortriptyline 25 MG capsule Commonly known as: PAMELOR       TAKE these medications    acetaminophen 650 MG CR tablet Commonly known as: TYLENOL Take 650 mg by mouth every 8 (eight) hours as needed for pain.   Ferriprox Twice-A-Day 1000 MG Tabs Generic drug: Deferiprone (Twice Daily) Take 1,000 mg by mouth 2 (two) times daily.   hydroxyurea 500 MG capsule Commonly known as: HYDREA Take 500 mg by mouth daily.   Lokelma 10 g Pack packet Generic drug: sodium zirconium cyclosilicate Take 10 g by mouth every other day.   Oxbryta 500 MG Tabs tablet Generic drug: voxelotor Take 1,500 mg by mouth daily.   OxyCONTIN 15 mg 12 hr tablet Generic drug: oxyCODONE Take 1 tablet (  15 mg total) by mouth every 12 (twelve) hours. What changed: Another medication with the same name was changed. Make sure you understand how and when to take each.   Oxycodone HCl 10 MG Tabs Take 1 tablet (10 mg total) by mouth every 4 (four) hours as needed. What changed: reasons to take this   sodium bicarbonate 650 MG tablet Take 1,950 mg by mouth 3 (three) times daily.       ASK your doctor about these medications    folic acid 1 MG tablet Commonly known as: FOLVITE TAKE 1 TABLET BY MOUTH EVERY DAY   mirtazapine 7.5 MG tablet Commonly known as: REMERON Take 1 tablet (7.5 mg total) by mouth at bedtime.   Vitamin D3 50 MCG (2000 UT) Tabs Take 1 each by mouth daily.        The results of significant diagnostics from this hospitalization (including imaging, microbiology, ancillary and laboratory) are listed below for reference.    Significant Diagnostic Studies: DG Chest 2  View  Result Date: 07/30/2020 CLINICAL DATA:  Sickle cell pain crisis EXAM: CHEST - 2 VIEW COMPARISON:  09/30/2019 FINDINGS: Stable cardiomediastinal contours. Mildly coarsened interstitial markings bilaterally. Streaky left basilar opacity. No pleural effusion or pneumothorax. 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 IMPRESSION: Streaky left basilar opacity may reflect atelectasis or developing infiltrate. Electronically Signed   By: Davina Poke D.O.   On: 07/30/2020 17:21    Microbiology: Recent Results (from the past 240 hour(s))  Urine culture     Status: None   Collection Time: 08/25/20 12:00 AM   Specimen: Urine, Clean Catch  Result Value Ref Range Status   Specimen Description   Final    URINE, CLEAN CATCH Performed at [facility], [place] [address], [place], [place] [address]    Special Requests   Final    NONE Performed at [facility], [place] [address] [place], [place] [address]    Culture   Final    NO GROWTH Performed at [facility], [place] [address] [place], [place] [address]    Report Status 08/26/2020 FINAL  Final     Labs: Basic Metabolic Panel: Recent Labs  Lab 08/24/20 1015 08/25/20 1327 08/26/20 0524  NA 136 136 135  K 4.9 5.5* 5.0  CL 104 106 109  CO2 24 24 21*  GLUCOSE 82 86 92  BUN 36* 31* 30*  CREATININE 3.97* 3.19* 2.84*  CALCIUM 8.0* 8.3* 8.2*   Liver Function Tests: Recent Labs  Lab 08/24/20 1015  AST 40  ALT 27  ALKPHOS 227*  BILITOT 1.1  PROT 5.9*  ALBUMIN 2.5*   No results for input(s): LIPASE, AMYLASE in the last 168 hours. No results for input(s): AMMONIA in the last 168 hours. CBC: Recent Labs  Lab 08/24/20 1015 08/25/20 0856 08/26/20 0524  WBC 14.2* 13.1* 14.7*  NEUTROABS 6.5  --   --   HGB 6.3* 9.2* 7.8*  HCT 18.6* 26.2* 22.7*  MCV 112.7* 98.9 102.3*  PLT 346 203 275   Cardiac Enzymes: No results for input(s): CKTOTAL,  CKMB, CKMBINDEX, TROPONINI in the last 168 hours. BNP: Invalid input(s): POCBNP CBG: No results for input(s): GLUCAP in the last 168 hours.  Time coordinating discharge: 35 minutes  Signed:  [name]  APRN, MSN, FNP-C Patient [facility] [place], [place] [address] [phone]  [facility] 08/27/2020,  6:11 PM

## 2020-08-31 ENCOUNTER — Other Ambulatory Visit: Payer: Self-pay | Admitting: Family Medicine

## 2020-08-31 ENCOUNTER — Non-Acute Institutional Stay (HOSPITAL_COMMUNITY)
Admission: RE | Admit: 2020-08-31 | Discharge: 2020-08-31 | Disposition: A | Discharge status: Home / Self Care | Payer: Medicare Other | Source: Ambulatory Visit | Attending: Internal Medicine | Admitting: Internal Medicine

## 2020-08-31 ENCOUNTER — Ambulatory Visit: Payer: Medicare Other | Admitting: Family Medicine

## 2020-08-31 ENCOUNTER — Other Ambulatory Visit: Payer: Self-pay

## 2020-08-31 ENCOUNTER — Other Ambulatory Visit (HOSPITAL_COMMUNITY): Payer: Self-pay

## 2020-08-31 DIAGNOSIS — G894 Chronic pain syndrome: Secondary | ICD-10-CM | POA: Insufficient documentation

## 2020-08-31 DIAGNOSIS — D571 Sickle-cell disease without crisis: Secondary | ICD-10-CM | POA: Insufficient documentation

## 2020-08-31 LAB — RAPID URINE DRUG SCREEN, HOSP PERFORMED
Amphetamines: NOT DETECTED
Barbiturates: NOT DETECTED
Benzodiazepines: POSITIVE — AB
Cocaine: NOT DETECTED
Opiates: POSITIVE — AB
Tetrahydrocannabinol: POSITIVE — AB

## 2020-08-31 LAB — CBC WITH DIFFERENTIAL/PLATELET
Abs Immature Granulocytes: 0.05 10*3/uL (ref 0.00–0.07)
Basophils Absolute: 0 10*3/uL (ref 0.0–0.1)
Basophils Relative: 0 %
Eosinophils Absolute: 0.4 10*3/uL (ref 0.0–0.5)
Eosinophils Relative: 2 %
HCT: 23.5 % — ABNORMAL LOW (ref 39.0–52.0)
Hemoglobin: 8 g/dL — ABNORMAL LOW (ref 13.0–17.0)
Immature Granulocytes: 0 %
Lymphocytes Relative: 47 %
Lymphs Abs: 6.9 10*3/uL — ABNORMAL HIGH (ref 0.7–4.0)
MCH: 35.9 pg — ABNORMAL HIGH (ref 26.0–34.0)
MCHC: 34 g/dL (ref 30.0–36.0)
MCV: 105.4 fL — ABNORMAL HIGH (ref 80.0–100.0)
Monocytes Absolute: 1.1 10*3/uL — ABNORMAL HIGH (ref 0.1–1.0)
Monocytes Relative: 8 %
Neutro Abs: 6.2 10*3/uL (ref 1.7–7.7)
Neutrophils Relative %: 43 %
Platelets: 343 10*3/uL (ref 150–400)
RBC: 2.23 MIL/uL — ABNORMAL LOW (ref 4.22–5.81)
RDW: 25.9 % — ABNORMAL HIGH (ref 11.5–15.5)
WBC: 14.7 10*3/uL — ABNORMAL HIGH (ref 4.0–10.5)
nRBC: 0 % (ref 0.0–0.2)

## 2020-08-31 LAB — COMPREHENSIVE METABOLIC PANEL
ALT: 41 U/L (ref 0–44)
AST: 63 U/L — ABNORMAL HIGH (ref 15–41)
Albumin: 2.8 g/dL — ABNORMAL LOW (ref 3.5–5.0)
Alkaline Phosphatase: 255 U/L — ABNORMAL HIGH (ref 38–126)
Anion gap: 6 (ref 5–15)
BUN: 42 mg/dL — ABNORMAL HIGH (ref 6–20)
CO2: 23 mmol/L (ref 22–32)
Calcium: 8.1 mg/dL — ABNORMAL LOW (ref 8.9–10.3)
Chloride: 109 mmol/L (ref 98–111)
Creatinine, Ser: 3.28 mg/dL — ABNORMAL HIGH (ref 0.61–1.24)
GFR, Estimated: 24 mL/min — ABNORMAL LOW (ref >60)
Glucose, Bld: 100 mg/dL — ABNORMAL HIGH (ref 70–99)
Potassium: 4.4 mmol/L (ref 3.5–5.1)
Sodium: 138 mmol/L (ref 135–145)
Total Bilirubin: 1 mg/dL (ref 0.3–1.2)
Total Protein: 6.5 g/dL (ref 6.5–8.1)

## 2020-08-31 NOTE — Progress Notes (Addendum)
Duplicate note

## 2020-08-31 NOTE — Progress Notes (Signed)
Patient to day for pill count of Oxycodone and OxyContin and to leave urine sample.  Patient had 5 Oxycontin and 19 Oxycodone.

## 2020-08-31 NOTE — Progress Notes (Signed)
PATIENT CARE CENTER NOTE   Diagnosis: Sickle cell Anemia    Provider: Hollis, [place], FNP   Procedure: Lab draw   Note:  Patient's labs drawn (CBC w/diff and CMP). Patient tolerated well. Labs taken to [facility] Lab along with urine sample. Patient alert, oriented and ambulatory at discharge.

## 2020-09-01 ENCOUNTER — Other Ambulatory Visit (HOSPITAL_COMMUNITY): Payer: Self-pay

## 2020-09-01 ENCOUNTER — Telehealth: Payer: Self-pay

## 2020-09-01 ENCOUNTER — Other Ambulatory Visit: Payer: Self-pay | Admitting: Family Medicine

## 2020-09-01 ENCOUNTER — Telehealth: Payer: Self-pay | Admitting: Family Medicine

## 2020-09-01 DIAGNOSIS — G894 Chronic pain syndrome: Secondary | ICD-10-CM

## 2020-09-01 DIAGNOSIS — D5700 Hb-SS disease with crisis, unspecified: Secondary | ICD-10-CM

## 2020-09-01 MED ORDER — OXYCODONE HCL 10 MG PO TABS: 10.0000 mg | ORAL_TABLET | ORAL | 0 refills | Status: DC | PRN

## 2020-09-01 MED ORDER — OXYCODONE HCL ER 15 MG PO T12A: 15.0000 mg | EXTENDED_RELEASE_TABLET | Freq: Two times a day (BID) | ORAL | 0 refills | Status: DC

## 2020-09-01 NOTE — Progress Notes (Signed)
Reviewed PDMP substance reporting system prior to prescribing opiate medications. No inconsistencies noted.  Meds ordered this encounter  Medications   Oxycodone HCl 10 MG TABS    Sig: Take 1 tablet (10 mg total) by mouth every 4 (four) hours as needed.    Dispense:  60 tablet    Refill:  0    Order Specific Question:   Supervising Provider    Answer:   [name] [id number]   oxyCODONE (OXYCONTIN) 15 mg 12 hr tablet    Sig: Take 1 tablet (15 mg total) by mouth every 12 (twelve) hours.    Dispense:  60 tablet    Refill:  0    Order Specific Question:   Supervising Provider    Answer:   [name] [id number]   [name] Pounds  APRN, MSN, FNP-C Patient [name] [address] [place], [place] [address] [phone]

## 2020-09-01 NOTE — Telephone Encounter (Signed)
Pain med refill °

## 2020-09-01 NOTE — Telephone Encounter (Signed)
[name] [name]. is a 40 year old male with a medical history significant for sickle cell disease, chronic pain syndrome, opiate dependence and tolerance, history of anemia of chronic disease, and chronic kidney disease stage III that presented to sickle cell day hospital for labs on 08/31/2020.  Serum creatinine was elevated at 3.28, which is above patient's baseline.  Patient will return to clinic on 09/02/2020 for hydration.  All of the laboratory values reviewed consistent with patient's baseline.  Patient advised to follow-up with his hematologist, first available appointment.  [name]  APRN, MSN, FNP-C Patient [facility] [address] [place], [place] [address] [phone]

## 2020-09-02 ENCOUNTER — Other Ambulatory Visit: Payer: Self-pay

## 2020-09-02 ENCOUNTER — Other Ambulatory Visit: Payer: Self-pay | Admitting: Family Medicine

## 2020-09-02 ENCOUNTER — Non-Acute Institutional Stay (HOSPITAL_COMMUNITY)
Admission: RE | Admit: 2020-09-02 | Discharge: 2020-09-02 | Disposition: A | Discharge status: Home / Self Care | Payer: Medicare Other | Source: Ambulatory Visit | Attending: Internal Medicine | Admitting: Internal Medicine

## 2020-09-02 DIAGNOSIS — N183 Chronic kidney disease, stage 3 unspecified: Secondary | ICD-10-CM | POA: Insufficient documentation

## 2020-09-02 DIAGNOSIS — E860 Dehydration: Secondary | ICD-10-CM

## 2020-09-02 DIAGNOSIS — D5700 Hb-SS disease with crisis, unspecified: Secondary | ICD-10-CM

## 2020-09-02 LAB — BASIC METABOLIC PANEL
Anion gap: 6 (ref 5–15)
BUN: 40 mg/dL — ABNORMAL HIGH (ref 6–20)
CO2: 25 mmol/L (ref 22–32)
Calcium: 8 mg/dL — ABNORMAL LOW (ref 8.9–10.3)
Chloride: 107 mmol/L (ref 98–111)
Creatinine, Ser: 3.33 mg/dL — ABNORMAL HIGH (ref 0.61–1.24)
GFR, Estimated: 23 mL/min — ABNORMAL LOW (ref >60)
Glucose, Bld: 104 mg/dL — ABNORMAL HIGH (ref 70–99)
Potassium: 4.4 mmol/L (ref 3.5–5.1)
Sodium: 138 mmol/L (ref 135–145)

## 2020-09-02 MED ORDER — SODIUM CHLORIDE 0.45 % IV BOLUS
250.0000 mL | Freq: Once | INTRAVENOUS | Status: AC
  Administered 2020-09-02: 250 mL via INTRAVENOUS

## 2020-09-02 NOTE — Progress Notes (Signed)
PATIENT CARE CENTER NOTE   Diagnosis: Chronic Kidney Disease Stage III   Provider: Hollis, [place], FNP   Procedure: IV hydration and lab work   Note:  Patient arrived for fluid hydration to address elevated creatinine. Patient received a 500 cc bolus of 0.45% NS via PIV. BMP drawn post bolus and creatinine remained elevated at 3.33. Provider notified and advised to given an additional 500 cc bolus of 0.45% NS. Patient received additional fluid for a total of 1 liter.  Patient is to come back next Tuesday 09/07/20 for recheck of labs. Vital signs stable. AVS offered but patient refused. Patient alert, oriented and ambulatory at discharge.

## 2020-09-07 ENCOUNTER — Other Ambulatory Visit: Payer: Self-pay | Admitting: Family Medicine

## 2020-09-07 ENCOUNTER — Other Ambulatory Visit: Payer: Self-pay

## 2020-09-07 ENCOUNTER — Ambulatory Visit: Payer: Medicare Other | Admitting: Family Medicine

## 2020-09-07 ENCOUNTER — Non-Acute Institutional Stay (HOSPITAL_COMMUNITY)
Admission: RE | Admit: 2020-09-07 | Discharge: 2020-09-07 | Disposition: A | Discharge status: Home / Self Care | Payer: Medicare Other | Source: Ambulatory Visit | Attending: Internal Medicine | Admitting: Internal Medicine

## 2020-09-07 DIAGNOSIS — N184 Chronic kidney disease, stage 4 (severe): Secondary | ICD-10-CM | POA: Insufficient documentation

## 2020-09-07 DIAGNOSIS — D571 Sickle-cell disease without crisis: Secondary | ICD-10-CM | POA: Insufficient documentation

## 2020-09-07 DIAGNOSIS — G894 Chronic pain syndrome: Secondary | ICD-10-CM

## 2020-09-07 DIAGNOSIS — D5700 Hb-SS disease with crisis, unspecified: Secondary | ICD-10-CM

## 2020-09-07 LAB — CBC WITH DIFFERENTIAL/PLATELET
Abs Immature Granulocytes: 0.05 10*3/uL (ref 0.00–0.07)
Basophils Absolute: 0.1 10*3/uL (ref 0.0–0.1)
Basophils Relative: 0 %
Eosinophils Absolute: 0.4 10*3/uL (ref 0.0–0.5)
Eosinophils Relative: 2 %
HCT: 24.8 % — ABNORMAL LOW (ref 39.0–52.0)
Hemoglobin: 8.2 g/dL — ABNORMAL LOW (ref 13.0–17.0)
Immature Granulocytes: 0 %
Lymphocytes Relative: 40 %
Lymphs Abs: 6.6 10*3/uL — ABNORMAL HIGH (ref 0.7–4.0)
MCH: 35.3 pg — ABNORMAL HIGH (ref 26.0–34.0)
MCHC: 33.1 g/dL (ref 30.0–36.0)
MCV: 106.9 fL — ABNORMAL HIGH (ref 80.0–100.0)
Monocytes Absolute: 1.2 10*3/uL — ABNORMAL HIGH (ref 0.1–1.0)
Monocytes Relative: 7 %
Neutro Abs: 8.4 10*3/uL — ABNORMAL HIGH (ref 1.7–7.7)
Neutrophils Relative %: 51 %
Platelets: 345 10*3/uL (ref 150–400)
RBC: 2.32 MIL/uL — ABNORMAL LOW (ref 4.22–5.81)
RDW: 26.4 % — ABNORMAL HIGH (ref 11.5–15.5)
WBC: 16.7 10*3/uL — ABNORMAL HIGH (ref 4.0–10.5)
nRBC: 0.1 % (ref 0.0–0.2)

## 2020-09-07 LAB — COMPREHENSIVE METABOLIC PANEL
ALT: 32 U/L (ref 0–44)
AST: 43 U/L — ABNORMAL HIGH (ref 15–41)
Albumin: 3.1 g/dL — ABNORMAL LOW (ref 3.5–5.0)
Alkaline Phosphatase: 300 U/L — ABNORMAL HIGH (ref 38–126)
Anion gap: 11 (ref 5–15)
BUN: 42 mg/dL — ABNORMAL HIGH (ref 6–20)
CO2: 20 mmol/L — ABNORMAL LOW (ref 22–32)
Calcium: 8.6 mg/dL — ABNORMAL LOW (ref 8.9–10.3)
Chloride: 108 mmol/L (ref 98–111)
Creatinine, Ser: 3.81 mg/dL — ABNORMAL HIGH (ref 0.61–1.24)
GFR, Estimated: 20 mL/min — ABNORMAL LOW (ref >60)
Glucose, Bld: 97 mg/dL (ref 70–99)
Potassium: 5.4 mmol/L — ABNORMAL HIGH (ref 3.5–5.1)
Sodium: 139 mmol/L (ref 135–145)
Total Bilirubin: 1.2 mg/dL (ref 0.3–1.2)
Total Protein: 7.2 g/dL (ref 6.5–8.1)

## 2020-09-07 NOTE — Progress Notes (Signed)
Patient in for pill count and to give urine sample. Stated he was rushing to leave the house and get here on time, so he forgot his medication bottles at home.

## 2020-09-07 NOTE — Progress Notes (Signed)
Patient's labs drawn (CBC w/diff and CMP). Patient tolerated well. Alert,oriented and ambulatory at discharge.

## 2020-09-14 ENCOUNTER — Other Ambulatory Visit: Payer: Self-pay | Admitting: Family Medicine

## 2020-09-14 ENCOUNTER — Ambulatory Visit (INDEPENDENT_AMBULATORY_CARE_PROVIDER_SITE_OTHER): Payer: Medicare Other | Admitting: Internal Medicine

## 2020-09-14 ENCOUNTER — Other Ambulatory Visit: Payer: Self-pay

## 2020-09-14 ENCOUNTER — Non-Acute Institutional Stay (HOSPITAL_COMMUNITY)
Admission: RE | Admit: 2020-09-14 | Discharge: 2020-09-14 | Disposition: A | Discharge status: Home / Self Care | Payer: Medicare Other | Source: Ambulatory Visit | Attending: Internal Medicine | Admitting: Internal Medicine

## 2020-09-14 DIAGNOSIS — G894 Chronic pain syndrome: Secondary | ICD-10-CM | POA: Diagnosis not present

## 2020-09-14 DIAGNOSIS — D5700 Hb-SS disease with crisis, unspecified: Secondary | ICD-10-CM | POA: Diagnosis not present

## 2020-09-14 DIAGNOSIS — D571 Sickle-cell disease without crisis: Secondary | ICD-10-CM

## 2020-09-14 LAB — CBC WITH DIFFERENTIAL/PLATELET
Abs Immature Granulocytes: 0.07 10*3/uL (ref 0.00–0.07)
Basophils Absolute: 0 10*3/uL (ref 0.0–0.1)
Basophils Relative: 0 %
Eosinophils Absolute: 0.3 10*3/uL (ref 0.0–0.5)
Eosinophils Relative: 2 %
HCT: 23.5 % — ABNORMAL LOW (ref 39.0–52.0)
Hemoglobin: 7.9 g/dL — ABNORMAL LOW (ref 13.0–17.0)
Immature Granulocytes: 0 %
Lymphocytes Relative: 42 %
Lymphs Abs: 6.7 10*3/uL — ABNORMAL HIGH (ref 0.7–4.0)
MCH: 35.6 pg — ABNORMAL HIGH (ref 26.0–34.0)
MCHC: 33.6 g/dL (ref 30.0–36.0)
MCV: 105.9 fL — ABNORMAL HIGH (ref 80.0–100.0)
Monocytes Absolute: 1.2 10*3/uL — ABNORMAL HIGH (ref 0.1–1.0)
Monocytes Relative: 7 %
Neutro Abs: 7.6 10*3/uL (ref 1.7–7.7)
Neutrophils Relative %: 49 %
Platelets: 346 10*3/uL (ref 150–400)
RBC: 2.22 MIL/uL — ABNORMAL LOW (ref 4.22–5.81)
WBC: 15.9 10*3/uL — ABNORMAL HIGH (ref 4.0–10.5)
nRBC: 0.1 % (ref 0.0–0.2)

## 2020-09-14 LAB — COMPREHENSIVE METABOLIC PANEL
ALT: 39 U/L (ref 0–44)
AST: 55 U/L — ABNORMAL HIGH (ref 15–41)
Albumin: 3.2 g/dL — ABNORMAL LOW (ref 3.5–5.0)
Alkaline Phosphatase: 241 U/L — ABNORMAL HIGH (ref 38–126)
Anion gap: 12 (ref 5–15)
BUN: 46 mg/dL — ABNORMAL HIGH (ref 6–20)
CO2: 21 mmol/L — ABNORMAL LOW (ref 22–32)
Calcium: 8.5 mg/dL — ABNORMAL LOW (ref 8.9–10.3)
Chloride: 105 mmol/L (ref 98–111)
Creatinine, Ser: 3.9 mg/dL — ABNORMAL HIGH (ref 0.61–1.24)
GFR, Estimated: 19 mL/min — ABNORMAL LOW (ref >60)
Glucose, Bld: 111 mg/dL — ABNORMAL HIGH (ref 70–99)
Potassium: 4.8 mmol/L (ref 3.5–5.1)
Sodium: 138 mmol/L (ref 135–145)
Total Bilirubin: 1.1 mg/dL (ref 0.3–1.2)
Total Protein: 7.3 g/dL (ref 6.5–8.1)

## 2020-09-14 LAB — RAPID URINE DRUG SCREEN, HOSP PERFORMED
Amphetamines: NOT DETECTED
Barbiturates: NOT DETECTED
Benzodiazepines: POSITIVE — AB
Cocaine: NOT DETECTED
Opiates: POSITIVE — AB
Tetrahydrocannabinol: POSITIVE — AB

## 2020-09-14 NOTE — Progress Notes (Signed)
PATIENT CARE CENTER NOTE     Diagnosis: Sickle cell Anemia      Provider: Hollis, [place], FNP     Procedure: Lab draw     Note:  Patient's labs drawn (CBC w/diff and CMP). Patient tolerated well. Labs taken to [facility] Lab along with urine sample. Patient alert, oriented and ambulatory at discharge.

## 2020-09-14 NOTE — Progress Notes (Signed)
Patient in for pill count and to give urine. Will be getting labs on day hospital side.   OxyContin-16 Oxycodone-1

## 2020-09-15 ENCOUNTER — Other Ambulatory Visit (HOSPITAL_COMMUNITY): Payer: Self-pay

## 2020-09-15 ENCOUNTER — Telehealth: Payer: Self-pay

## 2020-09-15 NOTE — Telephone Encounter (Signed)
Oxycontin Oxycodone

## 2020-09-16 ENCOUNTER — Other Ambulatory Visit: Payer: Self-pay | Admitting: Family Medicine

## 2020-09-16 ENCOUNTER — Other Ambulatory Visit (HOSPITAL_COMMUNITY): Payer: Self-pay

## 2020-09-16 DIAGNOSIS — D5700 Hb-SS disease with crisis, unspecified: Secondary | ICD-10-CM

## 2020-09-16 DIAGNOSIS — G894 Chronic pain syndrome: Secondary | ICD-10-CM

## 2020-09-16 MED ORDER — OXYCODONE HCL 10 MG PO TABS
10.0000 mg | ORAL_TABLET | Freq: Four times a day (QID) | ORAL | 0 refills | Status: DC
  Filled 2020-09-16: qty 60, 15d supply, fill #0

## 2020-09-16 NOTE — Progress Notes (Signed)
Reviewed PDMP substance reporting system prior to prescribing opiate medications. No inconsistencies noted.  Meds ordered this encounter  Medications   Oxycodone HCl 10 MG TABS    Sig: Take 1 tablet (10 mg total) by mouth every 6 (six) hours.    Dispense:  60 tablet    Refill:  0    Order Specific Question:   Supervising Provider    Answer:   [name] [id number]     [name]  APRN, MSN, FNP-C Patient [place] [place] [address] [place], [place] [address] [phone]

## 2020-09-21 ENCOUNTER — Encounter (HOSPITAL_COMMUNITY): Payer: Self-pay | Admitting: Student

## 2020-09-21 ENCOUNTER — Other Ambulatory Visit: Payer: Self-pay

## 2020-09-21 ENCOUNTER — Inpatient Hospital Stay (HOSPITAL_COMMUNITY)
Admission: EM | Admit: 2020-09-21 | Discharge: 2020-09-25 | DRG: 812 | Disposition: A | Discharge status: Home / Self Care | Payer: Medicare Other | Attending: Internal Medicine | Admitting: Internal Medicine

## 2020-09-21 ENCOUNTER — Telehealth (HOSPITAL_COMMUNITY): Payer: Self-pay

## 2020-09-21 DIAGNOSIS — M79605 Pain in left leg: Secondary | ICD-10-CM | POA: Diagnosis present

## 2020-09-21 DIAGNOSIS — Z79899 Other long term (current) drug therapy: Secondary | ICD-10-CM

## 2020-09-21 DIAGNOSIS — Z20822 Contact with and (suspected) exposure to covid-19: Secondary | ICD-10-CM | POA: Diagnosis present

## 2020-09-21 DIAGNOSIS — F17210 Nicotine dependence, cigarettes, uncomplicated: Secondary | ICD-10-CM | POA: Diagnosis present

## 2020-09-21 DIAGNOSIS — F17200 Nicotine dependence, unspecified, uncomplicated: Secondary | ICD-10-CM | POA: Diagnosis present

## 2020-09-21 DIAGNOSIS — F1490 Cocaine use, unspecified, uncomplicated: Secondary | ICD-10-CM | POA: Diagnosis present

## 2020-09-21 DIAGNOSIS — E860 Dehydration: Secondary | ICD-10-CM | POA: Diagnosis present

## 2020-09-21 DIAGNOSIS — Z87898 Personal history of other specified conditions: Secondary | ICD-10-CM | POA: Diagnosis not present

## 2020-09-21 DIAGNOSIS — F1491 Cocaine use, unspecified, in remission: Secondary | ICD-10-CM

## 2020-09-21 DIAGNOSIS — M79604 Pain in right leg: Secondary | ICD-10-CM | POA: Diagnosis present

## 2020-09-21 DIAGNOSIS — K219 Gastro-esophageal reflux disease without esophagitis: Secondary | ICD-10-CM | POA: Diagnosis present

## 2020-09-21 DIAGNOSIS — G894 Chronic pain syndrome: Secondary | ICD-10-CM | POA: Diagnosis present

## 2020-09-21 DIAGNOSIS — Z885 Allergy status to narcotic agent status: Secondary | ICD-10-CM

## 2020-09-21 DIAGNOSIS — N184 Chronic kidney disease, stage 4 (severe): Secondary | ICD-10-CM | POA: Diagnosis not present

## 2020-09-21 DIAGNOSIS — Z9049 Acquired absence of other specified parts of digestive tract: Secondary | ICD-10-CM | POA: Diagnosis not present

## 2020-09-21 DIAGNOSIS — D638 Anemia in other chronic diseases classified elsewhere: Secondary | ICD-10-CM | POA: Diagnosis present

## 2020-09-21 DIAGNOSIS — D5700 Hb-SS disease with crisis, unspecified: Principal | ICD-10-CM | POA: Diagnosis present

## 2020-09-21 DIAGNOSIS — D72829 Elevated white blood cell count, unspecified: Secondary | ICD-10-CM | POA: Diagnosis present

## 2020-09-21 DIAGNOSIS — Z803 Family history of malignant neoplasm of breast: Secondary | ICD-10-CM | POA: Diagnosis not present

## 2020-09-21 DIAGNOSIS — E559 Vitamin D deficiency, unspecified: Secondary | ICD-10-CM | POA: Diagnosis present

## 2020-09-21 DIAGNOSIS — F1290 Cannabis use, unspecified, uncomplicated: Secondary | ICD-10-CM | POA: Diagnosis present

## 2020-09-21 DIAGNOSIS — F1120 Opioid dependence, uncomplicated: Secondary | ICD-10-CM | POA: Diagnosis present

## 2020-09-21 DIAGNOSIS — Z886 Allergy status to analgesic agent status: Secondary | ICD-10-CM

## 2020-09-21 LAB — RETICULOCYTES
Immature Retic Fract: 20.5 % — ABNORMAL HIGH (ref 2.3–15.9)
RBC.: 2.15 MIL/uL — ABNORMAL LOW (ref 4.22–5.81)
Retic Count, Absolute: 135.5 10*3/uL (ref 19.0–186.0)
Retic Ct Pct: 6.3 % — ABNORMAL HIGH (ref 0.4–3.1)

## 2020-09-21 LAB — COMPREHENSIVE METABOLIC PANEL
ALT: 54 U/L — ABNORMAL HIGH (ref 0–44)
AST: 90 U/L — ABNORMAL HIGH (ref 15–41)
Albumin: 3 g/dL — ABNORMAL LOW (ref 3.5–5.0)
Alkaline Phosphatase: 259 U/L — ABNORMAL HIGH (ref 38–126)
Anion gap: 8 (ref 5–15)
BUN: 41 mg/dL — ABNORMAL HIGH (ref 6–20)
CO2: 19 mmol/L — ABNORMAL LOW (ref 22–32)
Calcium: 8.5 mg/dL — ABNORMAL LOW (ref 8.9–10.3)
Chloride: 110 mmol/L (ref 98–111)
Creatinine, Ser: 3.52 mg/dL — ABNORMAL HIGH (ref 0.61–1.24)
GFR, Estimated: 22 mL/min — ABNORMAL LOW (ref >60)
Glucose, Bld: 113 mg/dL — ABNORMAL HIGH (ref 70–99)
Potassium: 5 mmol/L (ref 3.5–5.1)
Sodium: 137 mmol/L (ref 135–145)
Total Bilirubin: 1.1 mg/dL (ref 0.3–1.2)
Total Protein: 7.2 g/dL (ref 6.5–8.1)

## 2020-09-21 LAB — CBC WITH DIFFERENTIAL/PLATELET
Abs Immature Granulocytes: 0 10*3/uL (ref 0.00–0.07)
Basophils Absolute: 0 10*3/uL (ref 0.0–0.1)
Basophils Relative: 0 %
Eosinophils Absolute: 0.5 10*3/uL (ref 0.0–0.5)
Eosinophils Relative: 3 %
HCT: 21.6 % — ABNORMAL LOW (ref 39.0–52.0)
Hemoglobin: 7.1 g/dL — ABNORMAL LOW (ref 13.0–17.0)
Lymphocytes Relative: 38 %
Lymphs Abs: 6.5 10*3/uL — ABNORMAL HIGH (ref 0.7–4.0)
MCH: 35.1 pg — ABNORMAL HIGH (ref 26.0–34.0)
MCHC: 32.9 g/dL (ref 30.0–36.0)
MCV: 106.9 fL — ABNORMAL HIGH (ref 80.0–100.0)
Monocytes Absolute: 1.4 10*3/uL — ABNORMAL HIGH (ref 0.1–1.0)
Monocytes Relative: 8 %
Neutro Abs: 8.7 10*3/uL — ABNORMAL HIGH (ref 1.7–7.7)
Neutrophils Relative %: 51 %
Platelets: 299 10*3/uL (ref 150–400)
RBC: 2.02 MIL/uL — ABNORMAL LOW (ref 4.22–5.81)
WBC: 17.1 10*3/uL — ABNORMAL HIGH (ref 4.0–10.5)
nRBC: 0.1 % (ref 0.0–0.2)

## 2020-09-21 LAB — MAGNESIUM: Magnesium: 2.2 mg/dL (ref 1.7–2.4)

## 2020-09-21 LAB — PHOSPHORUS: Phosphorus: 4.3 mg/dL (ref 2.5–4.6)

## 2020-09-21 LAB — RAPID URINE DRUG SCREEN, HOSP PERFORMED
Amphetamines: NOT DETECTED
Barbiturates: NOT DETECTED
Benzodiazepines: NOT DETECTED
Cocaine: NOT DETECTED
Opiates: POSITIVE — AB
Tetrahydrocannabinol: POSITIVE — AB

## 2020-09-21 LAB — LACTATE DEHYDROGENASE: LDH: 261 U/L — ABNORMAL HIGH (ref 98–192)

## 2020-09-21 LAB — RESP PANEL BY RT-PCR (FLU A&B, COVID) ARPGX2
Influenza A by PCR: NEGATIVE
Influenza B by PCR: NEGATIVE
SARS Coronavirus 2 by RT PCR: NEGATIVE

## 2020-09-21 MED ORDER — HYDROMORPHONE HCL 1 MG/ML IJ SOLN
1.0000 mg | Freq: Once | INTRAMUSCULAR | Status: AC
  Administered 2020-09-21: 1 mg via INTRAVENOUS
  Filled 2020-09-21: qty 1

## 2020-09-21 MED ORDER — OXYCODONE HCL ER 15 MG PO T12A
15.0000 mg | EXTENDED_RELEASE_TABLET | Freq: Two times a day (BID) | ORAL | Status: DC
  Administered 2020-09-21 – 2020-09-25 (×9): 15 mg via ORAL
  Filled 2020-09-21 (×9): qty 1

## 2020-09-21 MED ORDER — SODIUM CHLORIDE 0.9 % IV BOLUS
1000.0000 mL | Freq: Once | INTRAVENOUS | Status: AC
  Administered 2020-09-21: 1000 mL via INTRAVENOUS

## 2020-09-21 MED ORDER — ENOXAPARIN SODIUM 30 MG/0.3ML IJ SOSY
30.0000 mg | PREFILLED_SYRINGE | INTRAMUSCULAR | Status: DC
  Filled 2020-09-21: qty 0.3

## 2020-09-21 MED ORDER — SODIUM ZIRCONIUM CYCLOSILICATE 10 G PO PACK
10.0000 g | PACK | ORAL | Status: DC
  Administered 2020-09-22 – 2020-09-24 (×2): 10 g via ORAL
  Filled 2020-09-21 (×2): qty 1

## 2020-09-21 MED ORDER — OXYCODONE HCL 5 MG PO TABS
10.0000 mg | ORAL_TABLET | Freq: Four times a day (QID) | ORAL | Status: DC | PRN
  Administered 2020-09-21 – 2020-09-25 (×5): 10 mg via ORAL
  Filled 2020-09-21 (×5): qty 2

## 2020-09-21 MED ORDER — DEFERIPRONE (TWICE DAILY) 1000 MG PO TABS
1000.0000 mg | ORAL_TABLET | Freq: Two times a day (BID) | ORAL | Status: DC
  Administered 2020-09-23 – 2020-09-25 (×5): 1000 mg via ORAL

## 2020-09-21 MED ORDER — FOLIC ACID 1 MG PO TABS
1.0000 mg | ORAL_TABLET | Freq: Every day | ORAL | Status: DC
  Administered 2020-09-21 – 2020-09-25 (×5): 1 mg via ORAL
  Filled 2020-09-21 (×5): qty 1

## 2020-09-21 MED ORDER — VOXELOTOR 500 MG PO TABS
1500.0000 mg | ORAL_TABLET | Freq: Every day | ORAL | Status: DC
  Administered 2020-09-23 – 2020-09-25 (×3): 1500 mg via ORAL

## 2020-09-21 MED ORDER — HYDROMORPHONE 1 MG/ML IV SOLN
INTRAVENOUS | Status: DC
  Administered 2020-09-21 – 2020-09-22 (×3): 2.5 mg via INTRAVENOUS
  Administered 2020-09-22: 4 mg via INTRAVENOUS
  Administered 2020-09-22: 4.5 mg via INTRAVENOUS
  Administered 2020-09-22: 2.5 mg via INTRAVENOUS
  Administered 2020-09-22 – 2020-09-23 (×2): 1.5 mg via INTRAVENOUS
  Administered 2020-09-23: 4.5 mg via INTRAVENOUS
  Administered 2020-09-23: 3 mg via INTRAVENOUS
  Administered 2020-09-23: 1.5 mg via INTRAVENOUS
  Administered 2020-09-23: 3.5 mg via INTRAVENOUS
  Administered 2020-09-23: 10 mg via INTRAVENOUS
  Administered 2020-09-23: 5.1 mg via INTRAVENOUS
  Administered 2020-09-24: 4.5 mg via INTRAVENOUS
  Administered 2020-09-24: 1.5 mg via INTRAVENOUS
  Administered 2020-09-24: 5 mg via INTRAVENOUS
  Administered 2020-09-24: 4.5 mg via INTRAVENOUS
  Administered 2020-09-25: 8 mg via INTRAVENOUS
  Administered 2020-09-25 (×2): 2.5 mg via INTRAVENOUS
  Filled 2020-09-21 (×3): qty 30

## 2020-09-21 MED ORDER — SODIUM ZIRCONIUM CYCLOSILICATE 10 G PO PACK: 10.0000 g | PACK | ORAL | Status: DC

## 2020-09-21 MED ORDER — DIPHENHYDRAMINE HCL 25 MG PO CAPS
25.0000 mg | ORAL_CAPSULE | ORAL | Status: DC | PRN
  Administered 2020-09-21 – 2020-09-22 (×3): 25 mg via ORAL
  Filled 2020-09-21 (×4): qty 1

## 2020-09-21 MED ORDER — SODIUM CHLORIDE 0.9 % IV SOLN: 25.0000 mg | INTRAVENOUS | Status: DC | PRN

## 2020-09-21 MED ORDER — SODIUM CHLORIDE 0.45 % IV SOLN: INTRAVENOUS | Status: DC

## 2020-09-21 MED ORDER — NORTRIPTYLINE HCL 25 MG PO CAPS
25.0000 mg | ORAL_CAPSULE | Freq: Two times a day (BID) | ORAL | Status: DC
  Administered 2020-09-21 – 2020-09-25 (×8): 25 mg via ORAL
  Filled 2020-09-21 (×8): qty 1

## 2020-09-21 MED ORDER — HYDROMORPHONE HCL 1 MG/ML IJ SOLN
1.0000 mg | INTRAMUSCULAR | Status: AC | PRN
  Administered 2020-09-21 – 2020-09-23 (×3): 1 mg via INTRAVENOUS
  Filled 2020-09-21 (×3): qty 1

## 2020-09-21 MED ORDER — SODIUM CHLORIDE 0.9% FLUSH: 9.0000 mL | INTRAVENOUS | Status: DC | PRN

## 2020-09-21 MED ORDER — PROCHLORPERAZINE EDISYLATE 10 MG/2ML IJ SOLN
10.0000 mg | Freq: Once | INTRAMUSCULAR | Status: AC
  Administered 2020-09-21: 10 mg via INTRAVENOUS
  Filled 2020-09-21: qty 2

## 2020-09-21 MED ORDER — HYDROXYUREA 500 MG PO CAPS
500.0000 mg | ORAL_CAPSULE | Freq: Every day | ORAL | Status: DC
Start: 2020-09-21 — End: 2020-09-25
  Administered 2020-09-22 – 2020-09-25 (×4): 500 mg via ORAL
  Filled 2020-09-21 (×4): qty 1

## 2020-09-21 MED ORDER — SENNOSIDES-DOCUSATE SODIUM 8.6-50 MG PO TABS
1.0000 | ORAL_TABLET | Freq: Two times a day (BID) | ORAL | Status: DC
  Administered 2020-09-21: 1 via ORAL
  Filled 2020-09-21 (×7): qty 1

## 2020-09-21 MED ORDER — HYDROMORPHONE HCL 2 MG/ML IJ SOLN
2.0000 mg | Freq: Once | INTRAMUSCULAR | Status: AC
  Administered 2020-09-21: 2 mg via INTRAVENOUS
  Filled 2020-09-21: qty 1

## 2020-09-21 MED ORDER — NALOXONE HCL 0.4 MG/ML IJ SOLN: 0.4000 mg | INTRAMUSCULAR | Status: DC | PRN

## 2020-09-21 MED ORDER — POLYETHYLENE GLYCOL 3350 17 G PO PACK: 17.0000 g | PACK | Freq: Every day | ORAL | Status: DC | PRN

## 2020-09-21 NOTE — H&P (Signed)
H&P  Patient Demographics:  [name] [name], is a 40 y.o. male  MRN: [id number]   DOB - 04-08-1980  Admit Date - 09/21/2020  Outpatient Primary MD for the patient is [name], MD  Chief Complaint  Patient presents with   Sickle Cell Pain Crisis      HPI:   [name] [name]  is a 40 y.o. 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  Patient took his home OxyContin and oxycodone this morning with no sustained relief.  His pain is mostly in his lower extremities and lower back with no joint swelling or redness.  He denies any fever, cough, chest pain, shortness of breath, nausea, vomiting or diarrhea.  No urinary symptoms.  He denies any sick contact, recent travel, or exposure to COVID-19.  ED course: BP 109/76   Pulse 75   Temp 98.2 F (36.8 C) (Oral)   Resp 12   Ht '5\' 3"'$  (1.6 m)   Wt 54.4 kg   SpO2 100%   BMI 21.26 kg/m  He was found to be in pain, with above vital signs, his comprehensive metabolic panel showed serum creatinine at around his baseline of 3.5, slightly elevated AST and ALT as well as alkaline phosphatase, his white cell count is chronically elevated but today at 17.1, hemoglobin is at baseline of 7.1 with a reticulocyte count of 6.3%.  Patient received 3 doses of IV Dilaudid rapid dosing with no sustained relief, patient will be admitted to the hospital for further evaluation and management of sickle cell pain crisis.   Review of systems:  In addition to the HPI above, patient reports No fever or chills No Headache, No changes with vision or hearing No problems  swallowing food or liquids No chest pain, cough or shortness of breath No abdominal pain, No nausea or vomiting, Bowel movements are regular No blood in stool or urine No dysuria No new skin rashes or bruises No new joints pains-aches No new weakness, tingling, numbness in any extremity No recent weight gain or loss No polyuria, polydypsia or polyphagia No significant Mental Stressors  A full 10 point Review of Systems was done, except as stated above, all other Review of Systems were negative.  With Past History of the following :   Past Medical History:  Diagnosis Date   Abscess of right iliac fossa 09/24/2018   required Perc Drain 09/24/2018   Arachnoid Cyst of brain bilaterally    "2 really small ones in the back of my head; inside; saw them w/MRI" (09/25/2012)   Bacterial pneumonia ~ 2012   "caught it here in the hospital" (09/25/2012)   Chronic kidney disease    "from my sickle cell" (09/25/2012)   CKD (chronic kidney disease), stage II    Colitis 04/19/2017   CT scan abd/ pelvis   GERD (gastroesophageal reflux disease)    "after I eat alot of spicey foods" (09/25/2012)   Gynecomastia, male 07/10/2012   History of blood transfusion    "always related to sickle cell crisis" (09/25/2012)   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   Noted in noted from Hematology notes at [facility]    "  take RX qd to prevent them" (09/25/2012)   Opioid dependence with withdrawal ([place]) 08/30/2012   Renal insufficiency    Sickle cell anemia (HCC)    Sickle cell crisis ([place]) 09/25/2012   Sickle cell nephropathy ([place]) 07/10/2012   Sinus tachycardia    Tachycardia with heart rate 121-140 beats per minute with ambulation 08/04/2016      Past Surgical History:  Procedure Laterality Date   ABCESS DRAINAGE     CHOLECYSTECTOMY  ~ 2012   COLONOSCOPY N/A 04/23/2017   Procedure: COLONOSCOPY;  Surgeon: Irene Shipper, MD;  Location: [facility];  Service: Endoscopy;  Laterality: N/A;    IR FLUORO GUIDE CV LINE RIGHT  12/17/2016   IR RADIOLOGIST EVAL & MGMT  10/02/2018   IR RADIOLOGIST EVAL & MGMT  10/15/2018   IR REMOVAL TUN CV CATH W/O FL  12/21/2016   IR US GUIDE VASC ACCESS RIGHT  12/17/2016   spleenectomy       Social History:   Social History   Tobacco Use   Smoking status: Some Days    Packs/day: 0.10    Types: Cigarettes   Smokeless tobacco: Never   Tobacco comments:    09/25/2012 "I don't buy cigarettes; bum one from friends q now and then"  Substance Use Topics   Alcohol use: Yes    Alcohol/week: 0.0 standard drinks    Comment: Once a month     Lives - At home   Family History :   Family History  Problem Relation Age of Onset   Breast cancer Mother      Home Medications:   Prior to Admission medications   Medication Sig Start Date End Date Taking? Authorizing Provider  acetaminophen (TYLENOL) 650 MG CR tablet Take 650 mg by mouth every 8 (eight) hours as needed for pain.   Yes [provider]  Deferiprone, Twice Daily, (FERRIPROX TWICE-A-DAY) 1000 MG TABS Take 1,000 mg by mouth 2 (two) times daily.   Yes [provider]  folic acid (FOLVITE) 1 MG tablet TAKE 1 TABLET BY MOUTH EVERY DAY Patient taking differently: Take 1 mg by mouth daily. 03/22/20  Yes [name], FNP  hydroxyurea (HYDREA) 500 MG capsule Take 500 mg by mouth daily. 05/07/20  Yes [provider]  LOKELMA 10 g PACK packet Take 10 g by mouth every other day. 03/18/20  Yes [provider]  nortriptyline (PAMELOR) 25 MG capsule Take 25 mg by mouth 2 (two) times daily. 09/07/20  Yes [provider]  OXBRYTA 500 MG TABS tablet Take 1,500 mg by mouth daily. 03/26/20  Yes [provider]  oxyCODONE (OXYCONTIN) 15 mg 12 hr tablet Take 1 tablet (15 mg total) by mouth every 12 (twelve) hours. 09/01/20  Yes [name], FNP  sodium bicarbonate 650 MG tablet Take 1,950 mg by mouth 3 (three) times daily. 11/07/18  Yes [provider]  Cholecalciferol (VITAMIN D3) 50 MCG (2000 UT) TABS Take 1 each by mouth daily. Patient not taking: No sig reported 01/13/20   [name], FNP  mirtazapine (REMERON) 7.5 MG tablet Take 1 tablet (7.5 mg total) by mouth at bedtime. Patient not taking: No sig reported 08/12/20   [name], FNP  Oxycodone HCl 10 MG TABS Take 1 tablet (10 mg total) by mouth every 6 (six) hours. Patient taking differently: Take 10 mg by mouth every 6 (six) hours as needed (severe pain). 09/16/20   [name], FNP  Allergies:   Allergies  Allergen Reactions   Nsaids Other (See Comments)    Kidney disease   Morphine And Related Other (See Comments)    "Real bad headaches"      Physical Exam:   Vitals:   Vitals:   09/21/20 1230 09/21/20 1300  BP: 109/76 103/70  Pulse: 75 78  Resp: 12 10  Temp:    SpO2: 100% 98%    Physical Exam: Constitutional: Patient appears well-developed and well-nourished. Not in obvious distress. HENT: Normocephalic, atraumatic, External right and left ear normal. Oropharynx is clear and moist.  Eyes: Conjunctivae and EOM are normal. PERRLA, no scleral icterus. Neck: Normal ROM. Neck supple. No JVD. No tracheal deviation. No thyromegaly. CVS: RRR, S1/S2 +, no murmurs, no gallops, no carotid bruit.  Pulmonary: Effort and breath sounds normal, no stridor, rhonchi, wheezes, rales.  Abdominal: Soft. BS +, no distension, tenderness, rebound or guarding.  Musculoskeletal: Normal range of motion. No edema and no tenderness.  Lymphadenopathy: No lymphadenopathy noted, cervical, inguinal or axillary Neuro: Alert. Normal reflexes, muscle tone coordination. No cranial nerve deficit. Skin: Skin is warm and dry. No rash noted. Not diaphoretic. No erythema. No pallor. Psychiatric: Normal mood and affect. Behavior, judgment, thought content normal.   Data Review:   CBC Recent Labs  Lab 09/21/20 0942  WBC 17.1*  HGB 7.1*  HCT 21.6*  PLT 299   MCV 106.9*  MCH 35.1*  MCHC 32.9  RDW Not Measured  LYMPHSABS 6.5*  MONOABS 1.4*  EOSABS 0.5  BASOSABS 0.0   ------------------------------------------------------------------------------------------------------------------  Chemistries  Recent Labs  Lab 09/21/20 0942  NA 137  K 5.0  CL 110  CO2 19*  GLUCOSE 113*  BUN 41*  CREATININE 3.52*  CALCIUM 8.5*  AST 90*  ALT 54*  ALKPHOS 259*  BILITOT 1.1   ------------------------------------------------------------------------------------------------------------------ estimated creatinine clearance is 22.1 mL/min (A) (by C-G formula based on SCr of 3.52 mg/dL (H)). ------------------------------------------------------------------------------------------------------------------ No results for input(s): TSH, T4TOTAL, T3FREE, THYROIDAB in the last 72 hours.  Invalid input(s): FREET3  Coagulation profile No results for input(s): INR, PROTIME in the last 168 hours. ------------------------------------------------------------------------------------------------------------------- No results for input(s): DDIMER in the last 72 hours. -------------------------------------------------------------------------------------------------------------------  Cardiac Enzymes No results for input(s): CKMB, TROPONINI, MYOGLOBIN in the last 168 hours.  Invalid input(s): CK ------------------------------------------------------------------------------------------------------------------ No results found for: BNP  ---------------------------------------------------------------------------------------------------------------  Urinalysis    Component Value Date/Time   COLORURINE YELLOW 08/25/2020 0000   APPEARANCEUR CLEAR 08/25/2020 0000   LABSPEC 1.006 08/25/2020 0000   PHURINE 9.0 (H) 08/25/2020 0000   GLUCOSEU 50 (A) 08/25/2020 0000   HGBUR NEGATIVE 08/25/2020 0000   HGBUR small 03/10/2010 1445   BILIRUBINUR NEGATIVE 08/25/2020  0000   BILIRUBINUR neg 06/15/2020 1515   KETONESUR NEGATIVE 08/25/2020 0000   PROTEINUR 100 (A) 08/25/2020 0000   UROBILINOGEN 0.2 06/15/2020 1515   UROBILINOGEN 0.2 05/11/2017 1335   NITRITE NEGATIVE 08/25/2020 0000   LEUKOCYTESUR NEGATIVE 08/25/2020 0000    ----------------------------------------------------------------------------------------------------------------   Imaging Results:    No results found.   Assessment & Plan:  Principal Problem:   Sickle cell anemia with crisis (HCC) Active Problems:   Chronic kidney disease (CKD), stage IV (severe) (HCC)   Chronic pain syndrome   History of cocaine use  Hb Sickle Cell Disease with crisis: Admit patient, start IVF 0.45% Saline @ 75 mls/hour, start weight based Dilaudid PCA, Toradol is contraindicated due to CKD.  Restart oral home pain medications, Monitor vitals very closely, Re-evaluate pain scale regularly, 2  L of Oxygen by [place], Patient will be re-evaluated for pain in the context of function and relationship to baseline as care progresses. Leukocytosis: Most likely related to his sickle cell pain crisis, we will observe for now without antibiotics.  Patient has no fever or any other symptom of inflammation or infection. Sickle Cell Anemia: Hemoglobin is 7.1 which is close to patient's baseline.  We will hold off transfusion unless hemoglobin drops below 6.5.  We will continue Oxbryta, hydroxyurea and folic acid for now and monitor CBC closely. Chronic pain Syndrome: Restart and continue home pain medications. Tobacco dependence: Taten was counseled on the dangers of tobacco use, and was advised to quit. Reviewed strategies to maximize success, including removing cigarettes and smoking materials from environment, stress management and support of family/friends.  CKD stage IV: Serum creatinine is at baseline today. Gentle hydration. Patient declined renal diet.  We will continue to monitor.   DVT Prophylaxis: Subcut Lovenox    AM Labs Ordered, also please review Full Orders  Family Communication: Admission, patient's condition and plan of care including tests being ordered have been discussed with the patient who indicate understanding and agree with the plan and Code Status.  Code Status: Full Code  Consults called: None    Admission status: Inpatient    Time spent in minutes : 50 minutes  Angelica Chessman MD, MHA, CPE, FACP 09/21/2020 at 1:32 PM

## 2020-09-21 NOTE — ED Triage Notes (Signed)
Patient BIB POV c/o sickle cell crisis ongoing for a couple of days.  Patient called the clinic and was told to come here. Patient endorses pain in his back and BLE.  Patient also endorses vomiting and feels like his hemoglobin could be low.

## 2020-09-21 NOTE — Telephone Encounter (Signed)
Patient called in. Complains of pain in legs and back rates 10/10. Denied chest pain, abd pain, N/V/D, priapism. Pt states he has been having cold chills last night and today,denies fevers.  Wants to come in for treatment. Last took Oxycontin'10mg'$  at 6:00am, pt states he is out of his Oxycodone. Pt states his mother is his transportation today.  Pt says he feels like his Hgb might be low, and is unsure if he needs hospital admission today. Denies exposure to anyone covid positive in last two weeks, denies flu like symptoms today. Dr. Doreene Burke notified, pt not approved to come to day hospital today, pt instructed to go to ER. Pt made aware, verbalized understanding.
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
## 2020-09-22 DIAGNOSIS — G894 Chronic pain syndrome: Secondary | ICD-10-CM | POA: Diagnosis not present

## 2020-09-22 DIAGNOSIS — Z87898 Personal history of other specified conditions: Secondary | ICD-10-CM | POA: Diagnosis not present

## 2020-09-22 DIAGNOSIS — N184 Chronic kidney disease, stage 4 (severe): Secondary | ICD-10-CM | POA: Diagnosis not present

## 2020-09-22 DIAGNOSIS — D5700 Hb-SS disease with crisis, unspecified: Secondary | ICD-10-CM | POA: Diagnosis not present

## 2020-09-22 LAB — CBC WITH DIFFERENTIAL/PLATELET
Abs Immature Granulocytes: 0.11 10*3/uL — ABNORMAL HIGH (ref 0.00–0.07)
Basophils Absolute: 0.1 10*3/uL (ref 0.0–0.1)
Basophils Relative: 0 %
Eosinophils Absolute: 0.4 10*3/uL (ref 0.0–0.5)
Eosinophils Relative: 2 %
HCT: 21.6 % — ABNORMAL LOW (ref 39.0–52.0)
Hemoglobin: 7 g/dL — ABNORMAL LOW (ref 13.0–17.0)
Immature Granulocytes: 1 %
Lymphocytes Relative: 45 %
Lymphs Abs: 9 10*3/uL — ABNORMAL HIGH (ref 0.7–4.0)
MCH: 35.5 pg — ABNORMAL HIGH (ref 26.0–34.0)
MCHC: 32.4 g/dL (ref 30.0–36.0)
MCV: 109.6 fL — ABNORMAL HIGH (ref 80.0–100.0)
Monocytes Absolute: 1.3 10*3/uL — ABNORMAL HIGH (ref 0.1–1.0)
Monocytes Relative: 6 %
Neutro Abs: 9 10*3/uL — ABNORMAL HIGH (ref 1.7–7.7)
Neutrophils Relative %: 46 %
Platelets: 296 10*3/uL (ref 150–400)
RBC: 1.97 MIL/uL — ABNORMAL LOW (ref 4.22–5.81)
WBC: 19.8 10*3/uL — ABNORMAL HIGH (ref 4.0–10.5)
nRBC: 0.1 % (ref 0.0–0.2)

## 2020-09-22 LAB — COMPREHENSIVE METABOLIC PANEL
ALT: 57 U/L — ABNORMAL HIGH (ref 0–44)
AST: 90 U/L — ABNORMAL HIGH (ref 15–41)
Albumin: 2.8 g/dL — ABNORMAL LOW (ref 3.5–5.0)
Alkaline Phosphatase: 232 U/L — ABNORMAL HIGH (ref 38–126)
Anion gap: 3 — ABNORMAL LOW (ref 5–15)
BUN: 34 mg/dL — ABNORMAL HIGH (ref 6–20)
CO2: 20 mmol/L — ABNORMAL LOW (ref 22–32)
Calcium: 8.7 mg/dL — ABNORMAL LOW (ref 8.9–10.3)
Chloride: 113 mmol/L — ABNORMAL HIGH (ref 98–111)
Creatinine, Ser: 2.55 mg/dL — ABNORMAL HIGH (ref 0.61–1.24)
GFR, Estimated: 32 mL/min — ABNORMAL LOW (ref >60)
Glucose, Bld: 90 mg/dL (ref 70–99)
Potassium: 4.8 mmol/L (ref 3.5–5.1)
Sodium: 136 mmol/L (ref 135–145)
Total Bilirubin: 1 mg/dL (ref 0.3–1.2)
Total Protein: 6.4 g/dL — ABNORMAL LOW (ref 6.5–8.1)

## 2020-09-22 NOTE — Progress Notes (Signed)
Patient ID: [name] Pick., male   DOB: [birth year], 40 y.o.   MRN: [id number] Subjective: [name] [name] is a 40 year old male with a medical history significant for sickle cell disease type S, chronic pain syndrome, opiate dependence and tolerance, history of polysubstance abuse, immune complex glomerulonephritis, stage IV chronic kidney disease, and history of anemia of chronic disease was admitted for sickle cell pain crisis.  Patient is complaining of lower extremity pain and lower back pain, still at about 7/10 today.  His pain is mostly in his knee joints bilaterally, worse when he ambulates.  He denies any joint swelling or redness.  No fever, cough, chest pain, shortness of breath, nausea, vomiting or diarrhea.  Objective:  Vital signs in last 24 hours:  Vitals:   09/22/20 1138 09/22/20 1300 09/22/20 1534 09/22/20 1700  BP: 119/88  110/73   Pulse: 92  99   Resp: 15 15 16 16   Temp: 97.9 F (36.6 C)  98.3 F (36.8 C)   TempSrc: Oral  Oral   SpO2: 100% 100% 100% 100%  Weight:      Height:        Intake/Output from previous day:   Intake/Output Summary (Last 24 hours) at 09/22/2020 1720 Last data filed at 09/22/2020 1142 Gross per 24 hour  Intake 880 ml  Output 3000 ml  Net -2120 ml    Physical Exam: General: Alert, awake, oriented x3, in no acute distress.  HEENT: [place]/AT PEERL, EOMI Neck: Trachea midline,  no masses, no thyromegal,y no JVD, no carotid bruit OROPHARYNX:  Moist, No exudate/ erythema/lesions.  Heart: Regular rate and rhythm, without murmurs, rubs, gallops, PMI non-displaced, no heaves or thrills on palpation.  Lungs: Clear to auscultation, no wheezing or rhonchi noted. No increased vocal fremitus resonant to percussion  Abdomen: Soft, nontender, nondistended, positive bowel sounds, no masses no hepatosplenomegaly noted..  Neuro: No focal neurological deficits noted cranial nerves II through XII grossly intact. DTRs 2+ bilaterally upper and lower  extremities. Strength 5 out of 5 in bilateral upper and lower extremities. Musculoskeletal: No warm swelling or erythema around joints, no spinal tenderness noted. Psychiatric: Patient alert and oriented x3, good insight and cognition, good recent to remote recall. Lymph node survey: No cervical axillary or inguinal lymphadenopathy noted.  Lab Results:  Basic Metabolic Panel:    Component Value Date/Time   NA 136 09/22/2020 0546   NA 135 07/14/2020 1005   K 4.8 09/22/2020 0546   CL 113 (H) 09/22/2020 0546   CO2 20 (L) 09/22/2020 0546   BUN 34 (H) 09/22/2020 0546   BUN 35 (H) 07/14/2020 1005   CREATININE 2.55 (H) 09/22/2020 0546   CREATININE 1.45 (H) 05/12/2014 1430   GLUCOSE 90 09/22/2020 0546   CALCIUM 8.7 (L) 09/22/2020 0546   CBC:    Component Value Date/Time   WBC 19.8 (H) 09/22/2020 0546   HGB 7.0 (L) 09/22/2020 0546   HGB 8.6 (L) 07/14/2020 1005   HCT 21.6 (L) 09/22/2020 0546   HCT 23.6 (L) 07/14/2020 1005   PLT 296 09/22/2020 0546   PLT 339 07/14/2020 1005   MCV 109.6 (H) 09/22/2020 0546   MCV 117 (H) 07/14/2020 1005   NEUTROABS 9.0 (H) 09/22/2020 0546   NEUTROABS 7.0 07/14/2020 1005   LYMPHSABS 9.0 (H) 09/22/2020 0546   LYMPHSABS 6.2 (H) 07/14/2020 1005   MONOABS 1.3 (H) 09/22/2020 0546   EOSABS 0.4 09/22/2020 0546   EOSABS 0.3 07/14/2020 1005   BASOSABS 0.1 09/22/2020 0546  BASOSABS 0.0 07/14/2020 1005    Recent Results (from the past 240 hour(s))  Resp Panel by RT-PCR (Flu A&B, Covid) Nasopharyngeal Swab     Status: None   Collection Time: 09/21/20  2:05 PM   Specimen: Nasopharyngeal Swab; Nasopharyngeal(NP) swabs in vial transport medium  Result Value Ref Range Status   SARS Coronavirus 2 by RT PCR NEGATIVE NEGATIVE Final    Comment: (NOTE) SARS-CoV-2 target nucleic acids are NOT DETECTED.  The SARS-CoV-2 RNA is generally detectable in upper respiratory specimens during the acute phase of infection. The lowest concentration of SARS-CoV-2 viral  copies this assay can detect is 138 copies/mL. A negative result does not preclude SARS-Cov-2 infection and should not be used as the sole basis for treatment or other patient management decisions. A negative result may occur with  improper specimen collection/handling, submission of specimen other than nasopharyngeal swab, presence of viral mutation(s) within the areas targeted by this assay, and inadequate number of viral copies(<138 copies/mL). A negative result must be combined with clinical observations, patient history, and epidemiological information. The expected result is Negative.  Fact Sheet for Patients:  BloggerCourse.com  Fact Sheet for Healthcare Providers:  SeriousBroker.it  This test is no t yet approved or cleared by the [place] FDA and  has been authorized for detection and/or diagnosis of SARS-CoV-2 by FDA under an Emergency Use Authorization (EUA). This EUA will remain  in effect (meaning this test can be used) for the duration of the COVID-19 declaration under Section 564(b)(1) of the Act, 21 U.S.C.section 360bbb-3(b)(1), unless the authorization is terminated  or revoked sooner.       Influenza A by PCR NEGATIVE NEGATIVE Final   Influenza B by PCR NEGATIVE NEGATIVE Final    Comment: (NOTE) The Xpert Xpress SARS-CoV-2/FLU/RSV plus assay is intended as an aid in the diagnosis of influenza from Nasopharyngeal swab specimens and should not be used as a sole basis for treatment. Nasal washings and aspirates are unacceptable for Xpert Xpress SARS-CoV-2/FLU/RSV testing.  Fact Sheet for Patients: BloggerCourse.com  Fact Sheet for Healthcare Providers: SeriousBroker.it  This test is not yet approved or cleared by the [place] FDA and has been authorized for detection and/or diagnosis of SARS-CoV-2 by FDA under an Emergency Use Authorization (EUA). This  EUA will remain in effect (meaning this test can be used) for the duration of the COVID-19 declaration under Section 564(b)(1) of the Act, 21 U.S.C. section 360bbb-3(b)(1), unless the authorization is terminated or revoked.  Performed at [facility], [address] [place], [place] [address]     Studies/Results: No results found.  Medications: Scheduled Meds:  Deferiprone (Twice Daily)  1,000 mg Oral BID   enoxaparin (LOVENOX) injection  30 mg Subcutaneous Q24H   folic acid  1 mg Oral Daily   HYDROmorphone   Intravenous Q4H   hydroxyurea  500 mg Oral Daily   nortriptyline  25 mg Oral BID   oxyCODONE  15 mg Oral Q12H   senna-docusate  1 tablet Oral BID   sodium zirconium cyclosilicate  10 g Oral QODAY   voxelotor  1,500 mg Oral Daily   Continuous Infusions:  sodium chloride 75 mL/hr at 09/21/20 1516   diphenhydrAMINE     PRN Meds:.diphenhydrAMINE **OR** diphenhydrAMINE, HYDROmorphone (DILAUDID) injection, naloxone **AND** sodium chloride flush, oxyCODONE, polyethylene glycol  Consultants: None  Procedures: None  Antibiotics: None  Assessment/Plan: Principal Problem:   Sickle cell anemia with crisis (HCC) Active Problems:   Chronic kidney disease (CKD),  stage IV (severe) (HCC)   Chronic pain syndrome   History of cocaine use  Hb Sickle Cell Disease with crisis: Continue gentle hydration in view of CKD, continue weight based Dilaudid PCA, IV Toradol is contraindicated due to CKD.  Continue oral home medications as ordered.  Monitor vitals very closely, Re-evaluate pain scale. Sickle Cell Anemia: Hemoglobin is stable at baseline today.  There is no clinical indication for blood transfusion now.  We will continue to monitor closely and repeat labs in AM. Leukocytosis: Patient is afebrile, and no other signs suggestive of infection or inflammation.  We will monitor closely without antibiotics. Chronic pain Syndrome: Continue home medications. CKD  stage IV: Clinically stable.  Serum creatinine at baseline on admission.  Avoid all nephrotoxins, continue gentle hydration. Tobacco use disorder: [name] [name] was counseled on the dangers of tobacco use, and was advised to quit. Reviewed strategies to maximize success, including removing cigarettes and smoking materials from environment, stress management and support of family/friends.   Code Status: Full Code Family Communication: N/A Disposition Plan: Not yet ready for discharge  Meyer Arora  If 7PM-7AM, please contact night-coverage.  09/22/2020, 5:20 PM  LOS: 1 day

## 2020-09-23 DIAGNOSIS — Z87898 Personal history of other specified conditions: Secondary | ICD-10-CM | POA: Diagnosis not present

## 2020-09-23 DIAGNOSIS — G894 Chronic pain syndrome: Secondary | ICD-10-CM | POA: Diagnosis not present

## 2020-09-23 DIAGNOSIS — N184 Chronic kidney disease, stage 4 (severe): Secondary | ICD-10-CM | POA: Diagnosis not present

## 2020-09-23 DIAGNOSIS — D5700 Hb-SS disease with crisis, unspecified: Secondary | ICD-10-CM | POA: Diagnosis not present

## 2020-09-23 NOTE — Progress Notes (Signed)
Patient ID: [name] Pick., male   DOB: 1980-02-25, 40 y.o.   MRN: [id number] Subjective: [name] [name] is a 40 year old male with a medical history significant for sickle cell disease type S, chronic pain syndrome, opiate dependence and tolerance, history of polysubstance abuse, immune complex glomerulonephritis, stage IV chronic kidney disease, and history of anemia of chronic disease was admitted for sickle cell pain crisis.  Patient continues to complain of bilateral knee joint pains without swelling or redness.  Pain is worse on ambulation. No fall. No fever. Patient denies any cough, chest pain, headache, shortness of breath, nausea, vomiting or diarrhea.  Objective:  Vital signs in last 24 hours:  Vitals:   09/23/20 0558 09/23/20 0825 09/23/20 1152 09/23/20 1400  BP: 113/76  116/72 117/74  Pulse: 80  96   Resp: 14 15 14 16   Temp: 98.3 F (36.8 C)  98.7 F (37.1 C) 98.6 F (37 C)  TempSrc: Oral  Oral Oral  SpO2: 99% 95% 100% 100%  Weight:      Height:        Intake/Output from previous day:   Intake/Output Summary (Last 24 hours) at 09/23/2020 1659 Last data filed at 09/23/2020 0959 Gross per 24 hour  Intake 240 ml  Output 2850 ml  Net -2610 ml     Physical Exam: General: Alert, awake, oriented x3, in no acute distress.  HEENT: [place]/AT PEERL, EOMI Neck: Trachea midline,  no masses, no thyromegal,y no JVD, no carotid bruit OROPHARYNX:  Moist, No exudate/ erythema/lesions.  Heart: Regular rate and rhythm, without murmurs, rubs, gallops, PMI non-displaced, no heaves or thrills on palpation.  Lungs: Clear to auscultation, no wheezing or rhonchi noted. No increased vocal fremitus resonant to percussion  Abdomen: Soft, nontender, nondistended, positive bowel sounds, no masses no hepatosplenomegaly noted..  Neuro: No focal neurological deficits noted cranial nerves II through XII grossly intact. DTRs 2+ bilaterally upper and lower extremities. Strength 5 out of 5 in  bilateral upper and lower extremities. Musculoskeletal: No warm swelling or erythema around joints, no spinal tenderness noted. Psychiatric: Patient alert and oriented x3, good insight and cognition, good recent to remote recall. Lymph node survey: No cervical axillary or inguinal lymphadenopathy noted.  Lab Results:  Basic Metabolic Panel:    Component Value Date/Time   NA 136 09/22/2020 0546   NA 135 07/14/2020 1005   K 4.8 09/22/2020 0546   CL 113 (H) 09/22/2020 0546   CO2 20 (L) 09/22/2020 0546   BUN 34 (H) 09/22/2020 0546   BUN 35 (H) 07/14/2020 1005   CREATININE 2.55 (H) 09/22/2020 0546   CREATININE 1.45 (H) 05/12/2014 1430   GLUCOSE 90 09/22/2020 0546   CALCIUM 8.7 (L) 09/22/2020 0546   CBC:    Component Value Date/Time   WBC 19.8 (H) 09/22/2020 0546   HGB 7.0 (L) 09/22/2020 0546   HGB 8.6 (L) 07/14/2020 1005   HCT 21.6 (L) 09/22/2020 0546   HCT 23.6 (L) 07/14/2020 1005   PLT 296 09/22/2020 0546   PLT 339 07/14/2020 1005   MCV 109.6 (H) 09/22/2020 0546   MCV 117 (H) 07/14/2020 1005   NEUTROABS 9.0 (H) 09/22/2020 0546   NEUTROABS 7.0 07/14/2020 1005   LYMPHSABS 9.0 (H) 09/22/2020 0546   LYMPHSABS 6.2 (H) 07/14/2020 1005   MONOABS 1.3 (H) 09/22/2020 0546   EOSABS 0.4 09/22/2020 0546   EOSABS 0.3 07/14/2020 1005   BASOSABS 0.1 09/22/2020 0546   BASOSABS 0.0 07/14/2020 1005    Recent Results (from  the past 240 hour(s))  Resp Panel by RT-PCR (Flu A&B, Covid) Nasopharyngeal Swab     Status: None   Collection Time: 09/21/20  2:05 PM   Specimen: Nasopharyngeal Swab; Nasopharyngeal(NP) swabs in vial transport medium  Result Value Ref Range Status   SARS Coronavirus 2 by RT PCR NEGATIVE NEGATIVE Final    Comment: (NOTE) SARS-CoV-2 target nucleic acids are NOT DETECTED.  The SARS-CoV-2 RNA is generally detectable in upper respiratory specimens during the acute phase of infection. The lowest concentration of SARS-CoV-2 viral copies this assay can detect is 138  copies/mL. A negative result does not preclude SARS-Cov-2 infection and should not be used as the sole basis for treatment or other patient management decisions. A negative result may occur with  improper specimen collection/handling, submission of specimen other than nasopharyngeal swab, presence of viral mutation(s) within the areas targeted by this assay, and inadequate number of viral copies(<138 copies/mL). A negative result must be combined with clinical observations, patient history, and epidemiological information. The expected result is Negative.  Fact Sheet for Patients:  BloggerCourse.com  Fact Sheet for Healthcare Providers:  SeriousBroker.it  This test is no t yet approved or cleared by the [place] FDA and  has been authorized for detection and/or diagnosis of SARS-CoV-2 by FDA under an Emergency Use Authorization (EUA). This EUA will remain  in effect (meaning this test can be used) for the duration of the COVID-19 declaration under Section 564(b)(1) of the Act, 21 U.S.C.section 360bbb-3(b)(1), unless the authorization is terminated  or revoked sooner.       Influenza A by PCR NEGATIVE NEGATIVE Final   Influenza B by PCR NEGATIVE NEGATIVE Final    Comment: (NOTE) The Xpert Xpress SARS-CoV-2/FLU/RSV plus assay is intended as an aid in the diagnosis of influenza from Nasopharyngeal swab specimens and should not be used as a sole basis for treatment. Nasal washings and aspirates are unacceptable for Xpert Xpress SARS-CoV-2/FLU/RSV testing.  Fact Sheet for Patients: BloggerCourse.com  Fact Sheet for Healthcare Providers: SeriousBroker.it  This test is not yet approved or cleared by the [place] FDA and has been authorized for detection and/or diagnosis of SARS-CoV-2 by FDA under an Emergency Use Authorization (EUA). This EUA will remain in effect (meaning  this test can be used) for the duration of the COVID-19 declaration under Section 564(b)(1) of the Act, 21 U.S.C. section 360bbb-3(b)(1), unless the authorization is terminated or revoked.  Performed at [facility], [address], [place], [place] [address]     Studies/Results: No results found.  Medications: Scheduled Meds:  Deferiprone (Twice Daily)  1,000 mg Oral BID   enoxaparin (LOVENOX) injection  30 mg Subcutaneous Q24H   folic acid  1 mg Oral Daily   HYDROmorphone   Intravenous Q4H   hydroxyurea  500 mg Oral Daily   nortriptyline  25 mg Oral BID   oxyCODONE  15 mg Oral Q12H   senna-docusate  1 tablet Oral BID   sodium zirconium cyclosilicate  10 g Oral QODAY   voxelotor  1,500 mg Oral Daily   Continuous Infusions:  sodium chloride 75 mL/hr at 09/23/20 1153   diphenhydrAMINE     PRN Meds:.diphenhydrAMINE **OR** diphenhydrAMINE, naloxone **AND** sodium chloride flush, oxyCODONE, polyethylene glycol  Consultants: None  Procedures: None  Antibiotics: None  Assessment/Plan: Principal Problem:   Sickle cell anemia with crisis (HCC) Active Problems:   Chronic kidney disease (CKD), stage IV (severe) (HCC)   Chronic pain syndrome   History of  cocaine use  Hb Sickle Cell Disease with crisis: Reduce IV fluid to KVO.  Continue weight based Dilaudid PCA at current setting, will begin to wean tomorrow. IV Toradol is contraindicated due to CKD.  We will continue oral home medications as ordered.  Monitor vitals very closely, Re-evaluate pain scale. Sickle Cell Anemia: Hemoglobin remains stable at baseline. There is no clinical indication for blood transfusion now. We will continue to monitor closely and repeat labs in AM. Leukocytosis: Patient is afebrile, and no other signs suggestive of infection or inflammation.  We will monitor closely without antibiotics. Chronic pain Syndrome: Continue home medications. CKD stage IV: Clinically stable.  Serum  creatinine if no better than baseline today.  Continue to avoid all nephrotoxins. Tobacco use disorder: Kedrin was counseled on the dangers of tobacco use, and was advised to quit. Reviewed strategies to maximize success, including removing cigarettes and smoking materials from environment, stress management and support of family/friends.   Code Status: Full Code Family Communication: N/A Disposition Plan: Not yet ready for discharge  Tanaja Ganger  If 7PM-7AM, please contact night-coverage.  09/23/2020, 4:59 PM  LOS: 2 days

## 2020-09-24 LAB — CBC WITH DIFFERENTIAL/PLATELET
Abs Immature Granulocytes: 0.12 10*3/uL — ABNORMAL HIGH (ref 0.00–0.07)
Basophils Absolute: 0.1 10*3/uL (ref 0.0–0.1)
Basophils Relative: 0 %
Eosinophils Absolute: 0.4 10*3/uL (ref 0.0–0.5)
Eosinophils Relative: 2 %
HCT: 19 % — ABNORMAL LOW (ref 39.0–52.0)
Hemoglobin: 6.2 g/dL — CL (ref 13.0–17.0)
Immature Granulocytes: 1 %
Lymphocytes Relative: 48 %
Lymphs Abs: 7.8 10*3/uL — ABNORMAL HIGH (ref 0.7–4.0)
MCH: 34.4 pg — ABNORMAL HIGH (ref 26.0–34.0)
MCHC: 32.6 g/dL (ref 30.0–36.0)
MCV: 105.6 fL — ABNORMAL HIGH (ref 80.0–100.0)
Monocytes Absolute: 1.2 10*3/uL — ABNORMAL HIGH (ref 0.1–1.0)
Monocytes Relative: 7 %
Neutro Abs: 6.9 10*3/uL (ref 1.7–7.7)
Neutrophils Relative %: 42 %
Platelets: 272 10*3/uL (ref 150–400)
RBC: 1.8 MIL/uL — ABNORMAL LOW (ref 4.22–5.81)
RDW: 26.6 % — ABNORMAL HIGH (ref 11.5–15.5)
WBC: 16.4 10*3/uL — ABNORMAL HIGH (ref 4.0–10.5)
nRBC: 0 % (ref 0.0–0.2)

## 2020-09-24 LAB — COMPREHENSIVE METABOLIC PANEL
ALT: 47 U/L — ABNORMAL HIGH (ref 0–44)
AST: 59 U/L — ABNORMAL HIGH (ref 15–41)
Albumin: 2.7 g/dL — ABNORMAL LOW (ref 3.5–5.0)
Alkaline Phosphatase: 244 U/L — ABNORMAL HIGH (ref 38–126)
Anion gap: 6 (ref 5–15)
BUN: 34 mg/dL — ABNORMAL HIGH (ref 6–20)
CO2: 16 mmol/L — ABNORMAL LOW (ref 22–32)
Calcium: 8.6 mg/dL — ABNORMAL LOW (ref 8.9–10.3)
Chloride: 112 mmol/L — ABNORMAL HIGH (ref 98–111)
Creatinine, Ser: 3.02 mg/dL — ABNORMAL HIGH (ref 0.61–1.24)
GFR, Estimated: 26 mL/min — ABNORMAL LOW (ref >60)
Glucose, Bld: 88 mg/dL (ref 70–99)
Potassium: 5.6 mmol/L — ABNORMAL HIGH (ref 3.5–5.1)
Sodium: 134 mmol/L — ABNORMAL LOW (ref 135–145)
Total Bilirubin: 1 mg/dL (ref 0.3–1.2)
Total Protein: 6.2 g/dL — ABNORMAL LOW (ref 6.5–8.1)

## 2020-09-24 LAB — PREPARE RBC (CROSSMATCH)

## 2020-09-24 MED ORDER — SODIUM CHLORIDE 0.9% IV SOLUTION: Freq: Once | INTRAVENOUS | Status: DC

## 2020-09-24 NOTE — Care Management Important Message (Signed)
Important Message  Patient Details IM Letter given to the Patient. Name: [name] [name]. MRN: [id number] Date of Birth: 1980-07-24   Medicare Important Message Given:  Yes     Kerin Salen 09/24/2020, 9:55 AM

## 2020-09-24 NOTE — Progress Notes (Signed)
Date and time results received: 09/24/20  0849  Test: HGB Critical Value: 6.2  Name of Provider Notified: [name]  Orders Received? Or Actions Taken?: Orders Received - See Orders for details no orders placed

## 2020-09-25 ENCOUNTER — Other Ambulatory Visit (HOSPITAL_COMMUNITY): Payer: Self-pay

## 2020-09-25 LAB — CBC
HCT: 25.3 % — ABNORMAL LOW (ref 39.0–52.0)
Hemoglobin: 8.5 g/dL — ABNORMAL LOW (ref 13.0–17.0)
MCH: 33.7 pg (ref 26.0–34.0)
MCHC: 33.6 g/dL (ref 30.0–36.0)
MCV: 100.4 fL — ABNORMAL HIGH (ref 80.0–100.0)
Platelets: 239 10*3/uL (ref 150–400)
RBC: 2.52 MIL/uL — ABNORMAL LOW (ref 4.22–5.81)
RDW: 25.3 % — ABNORMAL HIGH (ref 11.5–15.5)
WBC: 14.6 10*3/uL — ABNORMAL HIGH (ref 4.0–10.5)
nRBC: 0 % (ref 0.0–0.2)

## 2020-09-25 LAB — TYPE AND SCREEN
ABO/RH(D): O POS
Antibody Screen: NEGATIVE
Donor AG Type: NEGATIVE
Donor AG Type: NEGATIVE
Unit division: 0
Unit division: 0

## 2020-09-25 LAB — BPAM RBC
Blood Product Expiration Date: 202208312359
Blood Product Expiration Date: 202209102359
ISSUE DATE / TIME: 202208121634
ISSUE DATE / TIME: 202208121937
Unit Type and Rh: 5100
Unit Type and Rh: 9500

## 2020-09-25 MED ORDER — OXYCODONE HCL ER 15 MG PO T12A
15.0000 mg | EXTENDED_RELEASE_TABLET | Freq: Two times a day (BID) | ORAL | 0 refills | Status: DC
  Filled 2020-09-25 – 2020-09-30 (×3): qty 60, 30d supply, fill #0
  Filled ????-??-??: fill #0

## 2020-09-25 MED ORDER — OXYCODONE HCL 10 MG PO TABS
10.0000 mg | ORAL_TABLET | Freq: Four times a day (QID) | ORAL | 0 refills | Status: DC | PRN
  Filled 2020-09-25 – 2020-09-30 (×3): qty 60, 15d supply, fill #0

## 2020-09-25 NOTE — Discharge Summary (Signed)
Physician Discharge Summary  Patient ID: [name] [name]. MRN: [id number] DOB/AGE: 1980/08/13 40 y.o.  Admit date: 09/21/2020 Discharge date: 09/25/2020  Admission Diagnoses:  Discharge Diagnoses:  Principal Problem:   Sickle cell anemia with crisis [facility]) Active Problems:   Chronic kidney disease (CKD), stage IV (severe) (HCC)   Chronic pain syndrome   History of cocaine use   Discharged Condition: good  Hospital Course: Patient is a 40 year old gentleman with history of sickle cell disease chronic kidney disease stage IV as well as chronic anemia admitted with sickle cell painful crisis.  Patient was treated with Dilaudid PCA.  No Toradol.  Some IV fluids.  Renal function was off at the time of admission but got better.  He also has significant dehydration.  Patient's hemoglobin dropped down to 6 and was transfused a unit of packed red blood cells.  His pain has improved he was doing much better.  Appears to be back to baseline.  Patient was therefore discharged home to follow-up with his PCP.  I have prescribed his home regimen being the weekend so he can have his pain medicines at home.  Consults: None  Significant Diagnostic Studies: labs: Serial CBCs and CMP is checked.  Hemoglobin dropped and patient had transfusion of packed red blood cells.  Treatments: IV hydration, analgesia: acetaminophen and Dilaudid, and blood transfusion  Discharge Exam: Blood pressure 93/62, pulse 80, temperature 98 F (36.7 C), temperature source Oral, resp. rate 16, height '5\' 3"'$  (1.6 m), weight 51.2 kg, SpO2 98 %. General appearance: alert, cooperative, appears stated age, and no distress Back: symmetric, no curvature. ROM normal. No CVA tenderness. Cardio: regular rate and rhythm, S1, S2 normal, no murmur, click, rub or gallop GI: soft, non-tender; bowel sounds normal; no masses,  no organomegaly Extremities: extremities normal, atraumatic, no cyanosis or edema Pulses: 2+ and symmetric Skin:  Skin color, texture, turgor normal. No rashes or lesions  Disposition: Discharge disposition: 01-Home or Self Care       Discharge Instructions     Diet - low sodium heart healthy   Complete by: As directed    Increase activity slowly   Complete by: As directed       Allergies as of 09/25/2020       Reactions   Nsaids Other (See Comments)   Kidney disease   Morphine And Related Other (See Comments)   "Real bad headaches"         Medication List     TAKE these medications    acetaminophen 650 MG CR tablet Commonly known as: TYLENOL Take 650 mg by mouth every 8 (eight) hours as needed for pain.   Ferriprox Twice-A-Day 1000 MG Tabs Generic drug: Deferiprone (Twice Daily) Take 1,000 mg by mouth 2 (two) times daily.   folic acid 1 MG tablet Commonly known as: FOLVITE TAKE 1 TABLET BY MOUTH EVERY DAY   hydroxyurea 500 MG capsule Commonly known as: HYDREA Take 500 mg by mouth daily.   Lokelma 10 g Pack packet Generic drug: sodium zirconium cyclosilicate Take 10 g by mouth every other day.   mirtazapine 7.5 MG tablet Commonly known as: REMERON Take 1 tablet (7.5 mg total) by mouth at bedtime.   nortriptyline 25 MG capsule Commonly known as: PAMELOR Take 25 mg by mouth 2 (two) times daily.   Oxbryta 500 MG Tabs tablet Generic drug: voxelotor Take 1,500 mg by mouth daily.   Oxycodone HCl 10 MG Tabs Take 1 tablet (10 mg total) by mouth  every 6 (six) hours as needed (severe pain).   oxyCODONE 15 mg 12 hr tablet Commonly known as: OxyCONTIN Take 1 tablet (15 mg total) by mouth every 12 (twelve) hours.   sodium bicarbonate 650 MG tablet Take 1,950 mg by mouth 3 (three) times daily.       ASK your doctor about these medications    Vitamin D3 50 MCG (2000 UT) Tabs Take 1 each by mouth daily.         SignedBarbette Merino 09/25/2020, 1:18 PM  Time spent 32 minutes

## 2020-09-28 ENCOUNTER — Ambulatory Visit: Payer: Medicare Other | Admitting: Family Medicine

## 2020-09-29 ENCOUNTER — Other Ambulatory Visit (HOSPITAL_COMMUNITY): Payer: Self-pay

## 2020-09-30 ENCOUNTER — Other Ambulatory Visit (HOSPITAL_COMMUNITY): Payer: Self-pay

## 2020-09-30 ENCOUNTER — Other Ambulatory Visit: Payer: Self-pay

## 2020-09-30 ENCOUNTER — Ambulatory Visit (INDEPENDENT_AMBULATORY_CARE_PROVIDER_SITE_OTHER): Payer: Medicare Other | Admitting: Family Medicine

## 2020-09-30 ENCOUNTER — Encounter: Payer: Self-pay | Admitting: Family Medicine

## 2020-09-30 VITALS — BP 134/82 | HR 92 | Temp 97.6°F | Ht 63.0 in | Wt 122.0 lb

## 2020-09-30 DIAGNOSIS — D571 Sickle-cell disease without crisis: Secondary | ICD-10-CM

## 2020-09-30 DIAGNOSIS — G894 Chronic pain syndrome: Secondary | ICD-10-CM

## 2020-09-30 DIAGNOSIS — R8290 Unspecified abnormal findings in urine: Secondary | ICD-10-CM | POA: Diagnosis not present

## 2020-09-30 LAB — POCT URINALYSIS DIPSTICK
Bilirubin, UA: NEGATIVE
Glucose, UA: NEGATIVE
Ketones, UA: NEGATIVE
Leukocytes, UA: NEGATIVE
Nitrite, UA: NEGATIVE
Protein, UA: POSITIVE — AB
Spec Grav, UA: 1.015 (ref 1.010–1.025)
Urobilinogen, UA: 0.2 E.U./dL
pH, UA: 7 (ref 5.0–8.0)

## 2020-09-30 NOTE — Patient Instructions (Signed)
Sickle Cell Anemia, Adult  Sickle cell anemia is a condition where your red blood cells are shaped like sickles. Red blood cells carry oxygen through the body. Sickle-shaped cells do not live as long as normal red blood cells. They also clump together and block blood from flowing through the blood vessels. This prevents the body from getting enough oxygen. Sickle cell anemia causes organ damage and pain. It alsoincreases the risk of infection. Follow these instructions at home: Medicines Take over-the-counter and prescription medicines only as told by your doctor. If you were prescribed an antibiotic medicine, take it as told by your doctor. Do not stop taking the antibiotic even if you start to feel better. If you develop a fever, do not take medicines to lower the fever right away. Tell your doctor about the fever. Managing pain, stiffness, and swelling Try these methods to help with pain: Use a heating pad. Take a warm bath. Distract yourself, such as by watching TV. Eating and drinking Drink enough fluid to keep your pee (urine) clear or pale yellow. Drink more in hot weather and during exercise. Limit or avoid alcohol. Eat a healthy diet. Eat plenty of fruits, vegetables, whole grains, and lean protein. Take vitamins and supplements as told by your doctor. Traveling When traveling, keep these with you: Your medical information. The names of your doctors. Your medicines. If you need to take an airplane, talk to your doctor first. Activity Rest often. Avoid exercises that make your heart beat much faster, such as jogging. General instructions Do not use products that have nicotine or tobacco, such as cigarettes and e-cigarettes. If you need help quitting, ask your doctor. Consider wearing a medical alert bracelet. Avoid being in high places (high altitudes), such as mountains. Avoid very hot or cold temperatures. Avoid places where the temperature changes a lot. Keep all follow-up  visits as told by your doctor. This is important. Contact a doctor if: A joint hurts. Your feet or hands hurt or swell. You feel tired (fatigued). Get help right away if: You have symptoms of infection. These include: Fever. Chills. Being very tired. Irritability. Poor eating. Throwing up (vomiting). You feel dizzy or faint. You have new stomach pain, especially on the left side. You have a an erection (priapism) that lasts more than 4 hours. You have numbness in your arms or legs. You have a hard time moving your arms or legs. You have trouble talking. You have pain that does not go away when you take medicine. You are short of breath. You are breathing fast. You have a long-term cough. You have pain in your chest. You have a bad headache. You have a stiff neck. Your stomach looks bloated even though you did not eat much. Your skin is pale. You suddenly cannot see well. Summary Sickle cell anemia is a condition where your red blood cells are shaped like sickles. Follow your doctor's advice on ways to manage pain, food to eat, activities to do, and steps to take for safe travel. Get medical help right away if you have any signs of infection, such as a fever. This information is not intended to replace advice given to you by your health care provider. Make sure you discuss any questions you have with your healthcare provider. Document Revised: 06/26/2019 Document Reviewed: 06/26/2019 Elsevier Patient Education  [place].

## 2020-09-30 NOTE — Progress Notes (Signed)
[name]   Established Patient Office Visit  Subjective:  Patient ID: [name] [name]., male    DOB: 02-06-1981  Age: 40 y.o. MRN: [id number]  CC:  Chief Complaint  Patient presents with   Follow-up    Has been out of medications since Saturday.     HPI [name] [name]. is a 40 year old male with a medical history significant for sickle cell disease, chronic pain syndrome, opiate dependence and tolerance, history of cocaine abuse, and history of stage IV chronic kidney disease presents for a post hospital follow-up and follow-up of chronic conditions.  Mr. Haddix was admitted to inpatient services on 09/21/2020 for acute sickle cell pain crisis and symptomatic anemia.  He was transfused 1 unit of PRBCs without complication.  Prior to discharge, patient's hemoglobin was 8.5 g/dL, which was slightly above his baseline.  Patient is followed consistently by [facility] hematology and has a follow-up appointment scheduled for 09/21/2020.  Patient says that he has been taking hydroxyurea and Oxbryta consistently and without interruption.  Patient reports a significant amount of pain on today primarily to bilateral knees.  His typical chronic pain is to lower extremities.  He rates his pain as 9/10.  He characterizes pain as constant and throbbing.  He attributes chronic pain exacerbation to running out of his oxycodone.  He states that pharmacy says that he will not be able to fill this medication until 10/01/2020.  Patient has a history of stage II chronic kidney disease.  He has a follow-up with nephrology this afternoon.  Creatinine and GFR have been worsening over the past 3 months.  Patient's baseline creatinine is 2-3 and creatinine has been consistently above 3 over the past several months.  Patient says that he has been taking all prescribed medications consistently.  He denies urinary retention, frequency, urgency, dysuria, lower extremity  edema.  Past Medical History:  Diagnosis Date   Abscess of right iliac fossa 09/24/2018   required Perc Drain 09/24/2018   Arachnoid Cyst of brain bilaterally    "2 really small ones in the back of my head; inside; saw them w/MRI" (09/25/2012)   Bacterial pneumonia ~ 2012   "caught it here in the hospital" (09/25/2012)   Chronic kidney disease    "from my sickle cell" (09/25/2012)   CKD (chronic kidney disease), stage II    Colitis 04/19/2017   CT scan abd/ pelvis   GERD (gastroesophageal reflux disease)    "after I eat alot of spicey foods" (09/25/2012)   Gynecomastia, male 07/10/2012   History of blood transfusion    "always related to sickle cell crisis" (09/25/2012)   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   Noted in noted from Hematology notes at [facility]    "take RX qd to prevent them" (09/25/2012)   Opioid dependence with withdrawal ([place]) 08/30/2012   Renal insufficiency    Sickle cell anemia (HCC)    Sickle cell crisis ([place]) 09/25/2012   Sickle cell nephropathy ([place]) 07/10/2012   Sinus tachycardia    Tachycardia with heart rate 121-140 beats per minute with ambulation 08/04/2016    Past Surgical History:  Procedure Laterality Date   ABCESS DRAINAGE     CHOLECYSTECTOMY  ~ 2012   COLONOSCOPY N/A 04/23/2017   Procedure: COLONOSCOPY;  Surgeon: Irene Shipper, MD;  Location: [facility];  Service: Endoscopy;  Laterality: N/A;   IR FLUORO GUIDE CV LINE RIGHT  12/17/2016   IR  RADIOLOGIST EVAL & MGMT  10/02/2018   IR RADIOLOGIST EVAL & MGMT  10/15/2018   IR REMOVAL TUN CV CATH W/O FL  12/21/2016   IR US GUIDE VASC ACCESS RIGHT  12/17/2016   spleenectomy      Family History  Problem Relation Age of Onset   Breast cancer Mother     Social History   Socioeconomic History   Marital status: Single    Spouse name: Not on file   Number of children: Not on file   Years of education: Not on file   Highest education level: Not on file  Occupational History    Not on file  Tobacco Use   Smoking status: Some Days    Packs/day: 0.10    Types: Cigarettes   Smokeless tobacco: Never   Tobacco comments:    09/25/2012 "I don't buy cigarettes; bum one from friends q now and then"  Vaping Use   Vaping Use: Never used  Substance and Sexual Activity   Alcohol use: Yes    Alcohol/week: 0.0 standard drinks    Comment: Once a month   Drug use: Yes    Types: Marijuana    Comment: Every 2-3 weeks    Sexual activity: Yes  Other Topics Concern   Not on file  Social History Narrative    Lives alone in [place].   Back at school, studying Public relations account executive. Unemployed.    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    Social Determinants of Health   Financial Resource Strain: Low Risk    Difficulty of Paying Living Expenses: Not hard at all  Food Insecurity: No Food Insecurity   Worried About Charity fundraiser in the Last Year: Never true   Arboriculturist in the Last Year: Never true  Transportation Needs: Unmet Transportation Needs   Lack of Transportation (Medical): Yes   Lack of Transportation (Non-Medical): Yes  Physical Activity: Inactive   Days of Exercise per Week: 0 days   Minutes of Exercise per Session: 0 min  Stress: Stress Concern Present   Feeling of Stress : To some extent  Social Connections: Socially Isolated   Frequency of Communication with Friends and Family: More than three times a week   Frequency of Social Gatherings with Friends and Family: More than three times a week   Attends Religious Services: Never   Marine scientist or Organizations: No   Attends Music therapist: Never   Marital Status: Divorced  Human resources officer Violence: Not At Risk   Fear of Current or Ex-Partner: No   Emotionally Abused: No   Physically Abused: No   Sexually Abused: No     Outpatient Medications Prior to Visit  Medication Sig Dispense Refill   acetaminophen (TYLENOL) 650 MG CR tablet Take 650 mg by mouth every 8 (eight) hours as needed for pain.     Deferiprone, Twice Daily, (FERRIPROX TWICE-A-DAY) 1000 MG TABS Take 1,000 mg by mouth 2 (two) times daily.     folic acid (FOLVITE) 1 MG tablet TAKE 1 TABLET BY MOUTH EVERY DAY (Patient taking differently: Take 1 mg by mouth daily.) 90 tablet 4   hydroxyurea (HYDREA) 500 MG capsule Take 500 mg by mouth daily.     LOKELMA 10 g PACK packet Take 10 g by mouth every other day.  mirtazapine (REMERON) 7.5 MG tablet Take 1 tablet (7.5 mg total) by mouth at bedtime. 15 tablet 1   nortriptyline (PAMELOR) 25 MG capsule Take 25 mg by mouth 2 (two) times daily.     OXBRYTA 500 MG TABS tablet Take 1,500 mg by mouth daily.     oxyCODONE (OXYCONTIN) 15 mg 12 hr tablet Take 1 tablet (15 mg total) by mouth every 12 (twelve) hours. 60 tablet 0   Oxycodone HCl 10 MG TABS Take 1 tablet (10 mg total) by mouth every 6 (six) hours as needed (severe pain). 60 tablet 0   sodium bicarbonate 650 MG tablet Take 1,950 mg by mouth 3 (three) times daily.     Cholecalciferol (VITAMIN D3) 50 MCG (2000 UT) TABS Take 1 each by mouth daily. (Patient not taking: No sig reported) 30 tablet 11   No facility-administered medications prior to visit.    Allergies  Allergen Reactions   Nsaids Other (See Comments)    Kidney disease   Morphine And Related Other (See Comments)    "Real bad headaches"     ROS Review of Systems  Constitutional:  Negative for activity change and appetite change.  HENT: Negative.    Eyes: Negative.   Respiratory: Negative.    Cardiovascular: Negative.   Gastrointestinal: Negative.   Genitourinary: Negative.   Musculoskeletal:  Positive for arthralgias and back pain.  Skin: Negative.   Neurological: Negative.   Hematological: Negative.   Psychiatric/Behavioral: Negative.       Objective:    Physical  Exam Constitutional:      Appearance: Normal appearance. He is obese.  HENT:     Mouth/Throat:     Pharynx: Oropharynx is clear.  Eyes:     Pupils: Pupils are equal, round, and reactive to light.  Cardiovascular:     Rate and Rhythm: Normal rate and regular rhythm.     Pulses: Normal pulses.  Pulmonary:     Effort: Pulmonary effort is normal.  Abdominal:     General: Bowel sounds are normal.  Skin:    General: Skin is warm.  Neurological:     General: No focal deficit present.     Mental Status: He is alert. Mental status is at baseline.  Psychiatric:        Mood and Affect: Mood normal.        Behavior: Behavior normal.        Thought Content: Thought content normal.        Judgment: Judgment normal.    BP 134/82 (BP Location: Left Arm, Patient Position: Sitting)   Pulse 92   Temp 97.6 F (36.4 C)   Ht '5\' 3"'  (1.6 m)   Wt 122 lb (55.3 kg)   SpO2 98%   BMI 21.61 kg/m  Wt Readings from Last 3 Encounters:  09/30/20 122 lb (55.3 kg)  09/25/20 112 lb 14 oz (51.2 kg)  08/25/20 123 lb 7.3 oz (56 kg)     Health Maintenance Due  Topic Date Due   COVID-19 Vaccine (3 - Moderna risk series) 07/24/2019   INFLUENZA VACCINE  09/13/2020    There are no preventive care reminders to display for this patient.  Lab Results  Component Value Date   TSH 2.860 05/11/2017   Lab Results  Component Value Date   WBC 14.6 (H) 09/25/2020   HGB 8.5 (L) 09/25/2020   HCT 25.3 (L) 09/25/2020   MCV 100.4 (H) 09/25/2020   PLT 239 09/25/2020   Lab Results  Component Value Date   NA 134 (L) 09/24/2020   K 5.6 (H) 09/24/2020   CO2 16 (L) 09/24/2020   GLUCOSE 88 09/24/2020   BUN 34 (H) 09/24/2020   CREATININE 3.02 (H) 09/24/2020   BILITOT 1.0 09/24/2020   ALKPHOS 244 (H) 09/24/2020   AST 59 (H) 09/24/2020   ALT 47 (H) 09/24/2020   PROT 6.2 (L) 09/24/2020   ALBUMIN 2.7 (L) 09/24/2020   CALCIUM 8.6 (L) 09/24/2020   ANIONGAP 6 09/24/2020   EGFR 25 (L) 07/14/2020   Lab Results   Component Value Date   CHOL 145 03/07/2018   Lab Results  Component Value Date   HDL 65 03/07/2018   Lab Results  Component Value Date   LDLCALC 30 03/07/2018   Lab Results  Component Value Date   TRIG 251 (H) 03/07/2018   Lab Results  Component Value Date   CHOLHDL 2.2 03/07/2018   Lab Results  Component Value Date   HGBA1C <4.2 (L) 03/07/2018      Assessment & Plan:   Problem List Items Addressed This Visit       Other   Hb-SS disease without crisis ([place])   Relevant Orders   Urinalysis Dipstick   Chronic pain syndrome - Primary   Relevant Orders   Urinalysis Dipstick   1. Chronic pain syndrome Reviewed PDMP substance reporting system prior to prescribing opiate medications. No inconsistencies noted.  Contacted pharmacy.  Oxycodone 10 mg every 6 hours number 60 can be picked up on today. - Urinalysis Dipstick - [id number] 11+Oxyco+Alc+Crt-Bund  2. Hb-SS disease without crisis [facility]) Patient has scheduled follow-up with hematology on 10/01/2020.  Will defer to hematology for labs.  Patient is refusing lab draw today. - Urinalysis Dipstick   Follow-up: Return in about 3 months (around 12/31/2020) for sickle cell anemia.    [name]  APRN, MSN, FNP-C Patient [place] [address] [place], [place] [address] [phone]

## 2020-10-01 ENCOUNTER — Other Ambulatory Visit (HOSPITAL_COMMUNITY): Payer: Self-pay

## 2020-10-01 DIAGNOSIS — Z79899 Other long term (current) drug therapy: Secondary | ICD-10-CM | POA: Diagnosis not present

## 2020-10-01 DIAGNOSIS — N183 Chronic kidney disease, stage 3 unspecified: Secondary | ICD-10-CM | POA: Diagnosis not present

## 2020-10-01 DIAGNOSIS — E790 Hyperuricemia without signs of inflammatory arthritis and tophaceous disease: Secondary | ICD-10-CM | POA: Diagnosis not present

## 2020-10-01 DIAGNOSIS — D571 Sickle-cell disease without crisis: Secondary | ICD-10-CM | POA: Diagnosis not present

## 2020-10-01 DIAGNOSIS — R7303 Prediabetes: Secondary | ICD-10-CM | POA: Diagnosis not present

## 2020-10-01 DIAGNOSIS — Z131 Encounter for screening for diabetes mellitus: Secondary | ICD-10-CM | POA: Diagnosis not present

## 2020-10-01 DIAGNOSIS — R899 Unspecified abnormal finding in specimens from other organs, systems and tissues: Secondary | ICD-10-CM | POA: Diagnosis not present

## 2020-10-02 LAB — URINE CULTURE

## 2020-10-04 ENCOUNTER — Other Ambulatory Visit (HOSPITAL_COMMUNITY): Payer: Self-pay

## 2020-10-06 LAB — DRUG SCREEN 764883 11+OXYCO+ALC+CRT-BUND
Amphetamines, Urine: NEGATIVE ng/mL
Barbiturate: NEGATIVE ng/mL
Cocaine (Metabolite): NEGATIVE ng/mL
Creatinine: 36.7 mg/dL (ref 20.0–300.0)
Ethanol: NEGATIVE %
Meperidine: NEGATIVE ng/mL
Methadone Screen, Urine: NEGATIVE ng/mL
OPIATE SCREEN URINE: NEGATIVE ng/mL
Oxycodone/Oxymorphone, Urine: NEGATIVE ng/mL
Phencyclidine: NEGATIVE ng/mL
Propoxyphene: NEGATIVE ng/mL
Tramadol: NEGATIVE ng/mL
pH, Urine: 7.4 (ref 4.5–8.9)

## 2020-10-06 LAB — CANNABINOID CONFIRMATION, UR
CANNABINOIDS: POSITIVE — AB
Carboxy THC GC/MS Conf: 223 ng/mL

## 2020-10-06 LAB — BENZODIAZEPINES CONFIRM, URINE: Benzodiazepines: NEGATIVE ng/mL

## 2020-10-08 DIAGNOSIS — E872 Acidosis: Secondary | ICD-10-CM | POA: Diagnosis not present

## 2020-10-08 DIAGNOSIS — E875 Hyperkalemia: Secondary | ICD-10-CM | POA: Diagnosis not present

## 2020-10-08 DIAGNOSIS — N2581 Secondary hyperparathyroidism of renal origin: Secondary | ICD-10-CM | POA: Diagnosis not present

## 2020-10-08 DIAGNOSIS — D5700 Hb-SS disease with crisis, unspecified: Secondary | ICD-10-CM | POA: Diagnosis not present

## 2020-10-08 DIAGNOSIS — N051 Unspecified nephritic syndrome with focal and segmental glomerular lesions: Secondary | ICD-10-CM | POA: Diagnosis not present

## 2020-10-08 DIAGNOSIS — N184 Chronic kidney disease, stage 4 (severe): Secondary | ICD-10-CM | POA: Diagnosis not present

## 2020-10-10 IMAGING — CR DG CHEST 2V
2 series · 2 of 2 positions shown · non-contrast
Comparison: 01/24/2019

CLINICAL DATA: Chest pain, history of sickle cell

EXAM:
CHEST - 2 VIEW

[w chest pa]
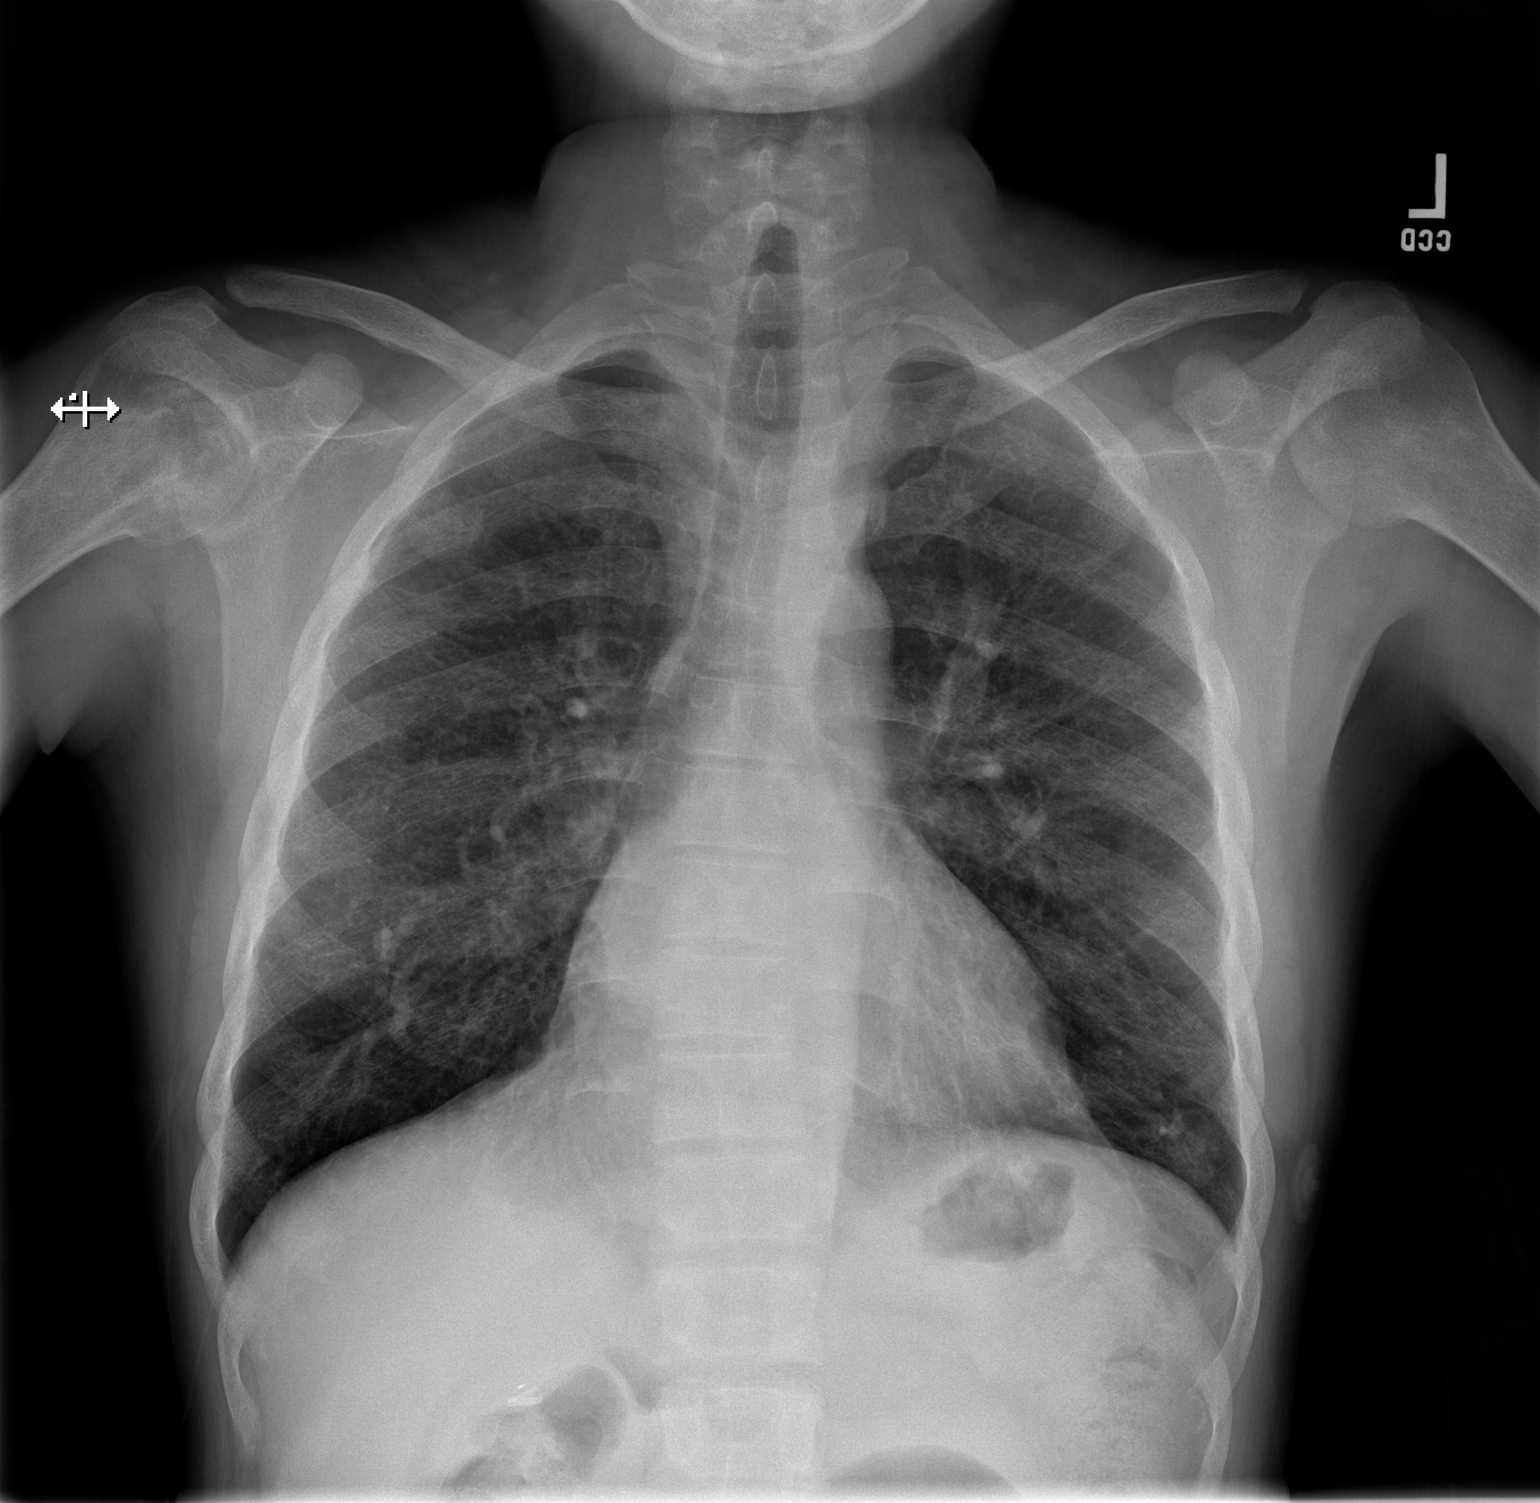

[w chest lat]
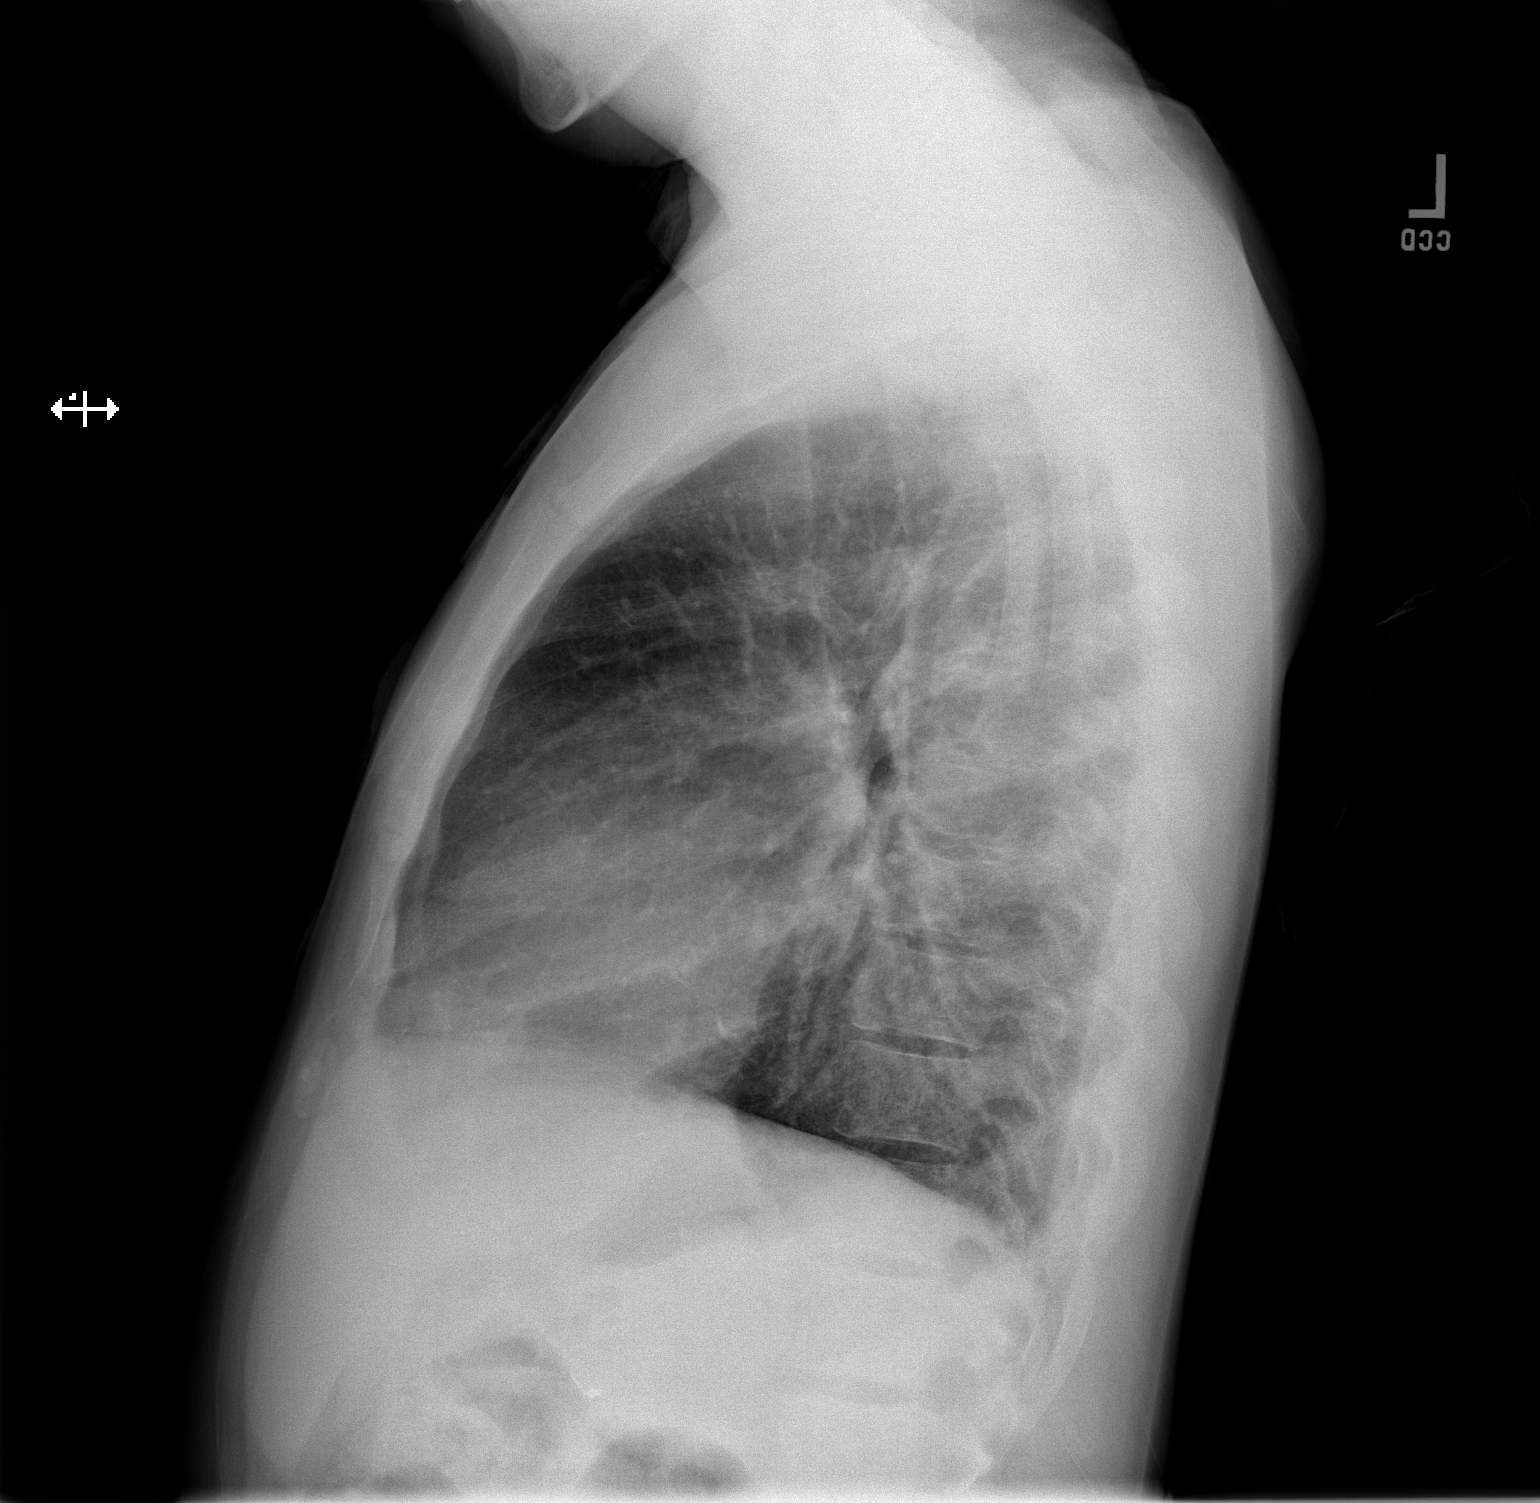

[2 of 2 positions shown; findings below may reference images not displayed]

FINDINGS: Check shadow is stable. The lungs are well aerated bilaterally. No
focal infiltrate or sizable effusion is seen. No bony abnormality is
noted. Scattered calcifications are noted within the lungs stable
from the previous exam. No new focal abnormality is noted. Bony
structures show no changes of avascular necrosis in the proximal
right humerus.
IMPRESSION: Chronic changes without acute abnormality.

## 2020-10-12 ENCOUNTER — Encounter (HOSPITAL_COMMUNITY): Payer: Medicare Other

## 2020-10-12 DIAGNOSIS — M25562 Pain in left knee: Secondary | ICD-10-CM | POA: Diagnosis not present

## 2020-10-12 DIAGNOSIS — M25561 Pain in right knee: Secondary | ICD-10-CM | POA: Diagnosis not present

## 2020-10-12 DIAGNOSIS — G894 Chronic pain syndrome: Secondary | ICD-10-CM | POA: Diagnosis not present

## 2020-10-12 DIAGNOSIS — G8929 Other chronic pain: Secondary | ICD-10-CM | POA: Diagnosis not present

## 2020-10-14 ENCOUNTER — Other Ambulatory Visit: Payer: Self-pay | Admitting: Family Medicine

## 2020-10-14 ENCOUNTER — Other Ambulatory Visit: Payer: Self-pay

## 2020-10-14 ENCOUNTER — Non-Acute Institutional Stay (HOSPITAL_COMMUNITY)
Admission: RE | Admit: 2020-10-14 | Discharge: 2020-10-14 | Disposition: A | Discharge status: Home / Self Care | Payer: Medicare Other | Source: Ambulatory Visit | Attending: Internal Medicine | Admitting: Internal Medicine

## 2020-10-14 DIAGNOSIS — F1410 Cocaine abuse, uncomplicated: Secondary | ICD-10-CM | POA: Diagnosis not present

## 2020-10-14 DIAGNOSIS — G894 Chronic pain syndrome: Secondary | ICD-10-CM | POA: Diagnosis not present

## 2020-10-14 DIAGNOSIS — D571 Sickle-cell disease without crisis: Secondary | ICD-10-CM | POA: Insufficient documentation

## 2020-10-14 LAB — RAPID URINE DRUG SCREEN, HOSP PERFORMED
Amphetamines: NOT DETECTED
Barbiturates: NOT DETECTED
Benzodiazepines: NOT DETECTED
Cocaine: NOT DETECTED
Opiates: POSITIVE — AB
Tetrahydrocannabinol: POSITIVE — AB

## 2020-10-14 LAB — CBC
HCT: 27 % — ABNORMAL LOW (ref 39.0–52.0)
Hemoglobin: 8.9 g/dL — ABNORMAL LOW (ref 13.0–17.0)
MCH: 34.9 pg — ABNORMAL HIGH (ref 26.0–34.0)
MCHC: 33 g/dL (ref 30.0–36.0)
MCV: 105.9 fL — ABNORMAL HIGH (ref 80.0–100.0)
Platelets: 428 10*3/uL — ABNORMAL HIGH (ref 150–400)
RBC: 2.55 MIL/uL — ABNORMAL LOW (ref 4.22–5.81)
RDW: 23.8 % — ABNORMAL HIGH (ref 11.5–15.5)
WBC: 16.7 10*3/uL — ABNORMAL HIGH (ref 4.0–10.5)
nRBC: 0.1 % (ref 0.0–0.2)

## 2020-10-14 MED ORDER — DARBEPOETIN ALFA 150 MCG/0.3ML IJ SOSY
150.0000 ug | PREFILLED_SYRINGE | INTRAMUSCULAR | Status: DC
  Filled 2020-10-14: qty 0.3

## 2020-10-14 NOTE — Progress Notes (Addendum)
PATIENT CARE CENTER NOTE    Diagnosis: Sickle Cell Anemia    Provider: Levora Angel, NP   Procedure: Lab draw   Note:  Patient arrived for lab draw and Aranesp injection. Per order, patient to received Aranesp for Hemoglobin less than 7.0. Patient's labs drawn and Hemoglobin was 8.9. Aranesp not given. Patient to come back in 2 weeks for lab draw and Aranesp injection if necessary. Patient alert, oriented and ambulatory at discharge.

## 2020-10-22 ENCOUNTER — Emergency Department (HOSPITAL_COMMUNITY): Payer: Medicare Other

## 2020-10-22 ENCOUNTER — Encounter (HOSPITAL_COMMUNITY): Payer: Self-pay | Admitting: Emergency Medicine

## 2020-10-22 ENCOUNTER — Other Ambulatory Visit: Payer: Self-pay

## 2020-10-22 ENCOUNTER — Inpatient Hospital Stay (HOSPITAL_COMMUNITY)
Admission: EM | Admit: 2020-10-22 | Discharge: 2020-10-24 | DRG: 812 | Disposition: A | Discharge status: Home / Self Care | Payer: Medicare Other | Attending: Internal Medicine | Admitting: Internal Medicine

## 2020-10-22 DIAGNOSIS — N184 Chronic kidney disease, stage 4 (severe): Secondary | ICD-10-CM | POA: Diagnosis present

## 2020-10-22 DIAGNOSIS — F1210 Cannabis abuse, uncomplicated: Secondary | ICD-10-CM | POA: Diagnosis present

## 2020-10-22 DIAGNOSIS — D72829 Elevated white blood cell count, unspecified: Secondary | ICD-10-CM | POA: Diagnosis not present

## 2020-10-22 DIAGNOSIS — Z9049 Acquired absence of other specified parts of digestive tract: Secondary | ICD-10-CM | POA: Diagnosis not present

## 2020-10-22 DIAGNOSIS — R0602 Shortness of breath: Secondary | ICD-10-CM | POA: Diagnosis not present

## 2020-10-22 DIAGNOSIS — Y93E1 Activity, personal bathing and showering: Secondary | ICD-10-CM

## 2020-10-22 DIAGNOSIS — R079 Chest pain, unspecified: Secondary | ICD-10-CM | POA: Diagnosis not present

## 2020-10-22 DIAGNOSIS — W19XXXA Unspecified fall, initial encounter: Secondary | ICD-10-CM | POA: Diagnosis not present

## 2020-10-22 DIAGNOSIS — G894 Chronic pain syndrome: Secondary | ICD-10-CM | POA: Diagnosis present

## 2020-10-22 DIAGNOSIS — W182XXA Fall in (into) shower or empty bathtub, initial encounter: Secondary | ICD-10-CM | POA: Diagnosis present

## 2020-10-22 DIAGNOSIS — F1290 Cannabis use, unspecified, uncomplicated: Secondary | ICD-10-CM | POA: Diagnosis not present

## 2020-10-22 DIAGNOSIS — F17210 Nicotine dependence, cigarettes, uncomplicated: Secondary | ICD-10-CM | POA: Diagnosis present

## 2020-10-22 DIAGNOSIS — Z803 Family history of malignant neoplasm of breast: Secondary | ICD-10-CM

## 2020-10-22 DIAGNOSIS — K219 Gastro-esophageal reflux disease without esophagitis: Secondary | ICD-10-CM | POA: Diagnosis present

## 2020-10-22 DIAGNOSIS — G43909 Migraine, unspecified, not intractable, without status migrainosus: Secondary | ICD-10-CM | POA: Diagnosis present

## 2020-10-22 DIAGNOSIS — S3992XA Unspecified injury of lower back, initial encounter: Secondary | ICD-10-CM | POA: Diagnosis present

## 2020-10-22 DIAGNOSIS — Y92009 Unspecified place in unspecified non-institutional (private) residence as the place of occurrence of the external cause: Secondary | ICD-10-CM | POA: Diagnosis not present

## 2020-10-22 DIAGNOSIS — Z043 Encounter for examination and observation following other accident: Secondary | ICD-10-CM | POA: Diagnosis not present

## 2020-10-22 DIAGNOSIS — D5700 Hb-SS disease with crisis, unspecified: Principal | ICD-10-CM | POA: Diagnosis present

## 2020-10-22 DIAGNOSIS — M90551 Osteonecrosis in diseases classified elsewhere, right thigh: Secondary | ICD-10-CM | POA: Diagnosis present

## 2020-10-22 DIAGNOSIS — M90552 Osteonecrosis in diseases classified elsewhere, left thigh: Secondary | ICD-10-CM | POA: Diagnosis present

## 2020-10-22 DIAGNOSIS — R000 Tachycardia, unspecified: Secondary | ICD-10-CM | POA: Diagnosis not present

## 2020-10-22 DIAGNOSIS — Z885 Allergy status to narcotic agent status: Secondary | ICD-10-CM | POA: Diagnosis not present

## 2020-10-22 DIAGNOSIS — Z886 Allergy status to analgesic agent status: Secondary | ICD-10-CM

## 2020-10-22 DIAGNOSIS — Z20822 Contact with and (suspected) exposure to covid-19: Secondary | ICD-10-CM | POA: Diagnosis present

## 2020-10-22 DIAGNOSIS — N08 Glomerular disorders in diseases classified elsewhere: Secondary | ICD-10-CM | POA: Diagnosis present

## 2020-10-22 LAB — RESP PANEL BY RT-PCR (FLU A&B, COVID) ARPGX2
Influenza A by PCR: NEGATIVE
Influenza B by PCR: NEGATIVE
SARS Coronavirus 2 by RT PCR: NEGATIVE

## 2020-10-22 LAB — CBC WITH DIFFERENTIAL/PLATELET
Abs Immature Granulocytes: 0.1 10*3/uL — ABNORMAL HIGH (ref 0.00–0.07)
Basophils Absolute: 0 10*3/uL (ref 0.0–0.1)
Basophils Relative: 0 %
Eosinophils Absolute: 0 10*3/uL (ref 0.0–0.5)
Eosinophils Relative: 0 %
HCT: 23.9 % — ABNORMAL LOW (ref 39.0–52.0)
Hemoglobin: 8 g/dL — ABNORMAL LOW (ref 13.0–17.0)
Immature Granulocytes: 1 %
Lymphocytes Relative: 26 %
Lymphs Abs: 4.6 10*3/uL — ABNORMAL HIGH (ref 0.7–4.0)
MCH: 35.7 pg — ABNORMAL HIGH (ref 26.0–34.0)
MCHC: 33.5 g/dL (ref 30.0–36.0)
MCV: 106.7 fL — ABNORMAL HIGH (ref 80.0–100.0)
Monocytes Absolute: 1 10*3/uL (ref 0.1–1.0)
Monocytes Relative: 6 %
Neutro Abs: 11.8 10*3/uL — ABNORMAL HIGH (ref 1.7–7.7)
Neutrophils Relative %: 67 %
Platelets: 351 10*3/uL (ref 150–400)
RBC: 2.24 MIL/uL — ABNORMAL LOW (ref 4.22–5.81)
RDW: 24.1 % — ABNORMAL HIGH (ref 11.5–15.5)
WBC: 17.6 10*3/uL — ABNORMAL HIGH (ref 4.0–10.5)
nRBC: 0 % (ref 0.0–0.2)

## 2020-10-22 LAB — COMPREHENSIVE METABOLIC PANEL
ALT: 81 U/L — ABNORMAL HIGH (ref 0–44)
AST: 91 U/L — ABNORMAL HIGH (ref 15–41)
Albumin: 3.5 g/dL (ref 3.5–5.0)
Alkaline Phosphatase: 289 U/L — ABNORMAL HIGH (ref 38–126)
Anion gap: 12 (ref 5–15)
BUN: 51 mg/dL — ABNORMAL HIGH (ref 6–20)
CO2: 19 mmol/L — ABNORMAL LOW (ref 22–32)
Calcium: 8.1 mg/dL — ABNORMAL LOW (ref 8.9–10.3)
Chloride: 110 mmol/L (ref 98–111)
Creatinine, Ser: 2.95 mg/dL — ABNORMAL HIGH (ref 0.61–1.24)
GFR, Estimated: 27 mL/min — ABNORMAL LOW (ref >60)
Glucose, Bld: 100 mg/dL — ABNORMAL HIGH (ref 70–99)
Potassium: 4.7 mmol/L (ref 3.5–5.1)
Sodium: 141 mmol/L (ref 135–145)
Total Bilirubin: 0.8 mg/dL (ref 0.3–1.2)
Total Protein: 7.2 g/dL (ref 6.5–8.1)

## 2020-10-22 LAB — URINALYSIS, ROUTINE W REFLEX MICROSCOPIC
Bilirubin Urine: NEGATIVE
Glucose, UA: 100 mg/dL — AB
Ketones, ur: NEGATIVE mg/dL
Leukocytes,Ua: NEGATIVE
Nitrite: NEGATIVE
Protein, ur: >300 mg/dL — AB
Specific Gravity, Urine: 1.015 (ref 1.005–1.030)
pH: 7 (ref 5.0–8.0)

## 2020-10-22 LAB — URINALYSIS, MICROSCOPIC (REFLEX): Bacteria, UA: NONE SEEN

## 2020-10-22 LAB — RETICULOCYTES
Immature Retic Fract: 30.3 % — ABNORMAL HIGH (ref 2.3–15.9)
RBC.: 2.24 MIL/uL — ABNORMAL LOW (ref 4.22–5.81)
Retic Count, Absolute: 130.6 10*3/uL (ref 19.0–186.0)
Retic Ct Pct: 5.8 % — ABNORMAL HIGH (ref 0.4–3.1)

## 2020-10-22 LAB — CK: Total CK: 57 U/L (ref 49–397)

## 2020-10-22 MED ORDER — ACETAMINOPHEN 325 MG PO TABS: 650.0000 mg | ORAL_TABLET | Freq: Four times a day (QID) | ORAL | Status: DC | PRN

## 2020-10-22 MED ORDER — NALOXONE HCL 0.4 MG/ML IJ SOLN: 0.4000 mg | INTRAMUSCULAR | Status: DC | PRN

## 2020-10-22 MED ORDER — HYDROMORPHONE 1 MG/ML IV SOLN: INTRAVENOUS | Status: DC

## 2020-10-22 MED ORDER — POLYETHYLENE GLYCOL 3350 17 G PO PACK: 17.0000 g | PACK | Freq: Every day | ORAL | Status: DC | PRN

## 2020-10-22 MED ORDER — SODIUM ZIRCONIUM CYCLOSILICATE 10 G PO PACK
10.0000 g | PACK | ORAL | Status: DC
  Administered 2020-10-24: 10 g via ORAL
  Filled 2020-10-22: qty 1

## 2020-10-22 MED ORDER — DIPHENHYDRAMINE HCL 25 MG PO CAPS: 25.0000 mg | ORAL_CAPSULE | ORAL | Status: DC | PRN

## 2020-10-22 MED ORDER — HYDROMORPHONE HCL 2 MG/ML IJ SOLN: 2.0000 mg | INTRAMUSCULAR | Status: DC

## 2020-10-22 MED ORDER — ONDANSETRON HCL 4 MG PO TABS: 4.0000 mg | ORAL_TABLET | Freq: Four times a day (QID) | ORAL | Status: DC | PRN

## 2020-10-22 MED ORDER — SODIUM CHLORIDE 0.9 % IV SOLN: 25.0000 mg | INTRAVENOUS | Status: DC | PRN

## 2020-10-22 MED ORDER — HYDROXYUREA 500 MG PO CAPS
500.0000 mg | ORAL_CAPSULE | Freq: Every day | ORAL | Status: DC
  Administered 2020-10-23 – 2020-10-24 (×2): 500 mg via ORAL
  Filled 2020-10-22 (×2): qty 1

## 2020-10-22 MED ORDER — ONDANSETRON HCL 4 MG/2ML IJ SOLN: 4.0000 mg | Freq: Four times a day (QID) | INTRAMUSCULAR | Status: DC | PRN

## 2020-10-22 MED ORDER — OXYCODONE HCL 5 MG PO TABS
15.0000 mg | ORAL_TABLET | Freq: Once | ORAL | Status: AC
Start: 2020-10-22 — End: 2020-10-22
  Administered 2020-10-22: 15 mg via ORAL
  Filled 2020-10-22: qty 3

## 2020-10-22 MED ORDER — HYDROMORPHONE HCL 2 MG/ML IJ SOLN
2.0000 mg | Freq: Once | INTRAMUSCULAR | Status: AC
  Administered 2020-10-22: 2 mg via INTRAVENOUS
  Filled 2020-10-22: qty 1

## 2020-10-22 MED ORDER — SODIUM BICARBONATE 650 MG PO TABS
1950.0000 mg | ORAL_TABLET | Freq: Three times a day (TID) | ORAL | Status: DC
  Administered 2020-10-22 – 2020-10-24 (×5): 1950 mg via ORAL
  Filled 2020-10-22 (×5): qty 3

## 2020-10-22 MED ORDER — ENOXAPARIN SODIUM 30 MG/0.3ML IJ SOSY
30.0000 mg | PREFILLED_SYRINGE | INTRAMUSCULAR | Status: DC
  Administered 2020-10-22: 30 mg via SUBCUTANEOUS
  Filled 2020-10-22 (×2): qty 0.3

## 2020-10-22 MED ORDER — DEFERIPRONE (TWICE DAILY) 1000 MG PO TABS
1000.0000 mg | ORAL_TABLET | Freq: Two times a day (BID) | ORAL | Status: DC
  Administered 2020-10-23 – 2020-10-24 (×2): 1000 mg via ORAL

## 2020-10-22 MED ORDER — HYDROMORPHONE HCL 2 MG/ML IJ SOLN
2.0000 mg | INTRAMUSCULAR | Status: AC
  Administered 2020-10-22: 2 mg via INTRAVENOUS

## 2020-10-22 MED ORDER — NORTRIPTYLINE HCL 25 MG PO CAPS
25.0000 mg | ORAL_CAPSULE | Freq: Two times a day (BID) | ORAL | Status: DC
  Administered 2020-10-22 – 2020-10-24 (×4): 25 mg via ORAL
  Filled 2020-10-22 (×4): qty 1

## 2020-10-22 MED ORDER — FOLIC ACID 1 MG PO TABS
1.0000 mg | ORAL_TABLET | Freq: Every day | ORAL | Status: DC
  Administered 2020-10-23 – 2020-10-24 (×2): 1 mg via ORAL
  Filled 2020-10-22 (×2): qty 1

## 2020-10-22 MED ORDER — ACETAMINOPHEN 650 MG RE SUPP: 650.0000 mg | Freq: Four times a day (QID) | RECTAL | Status: DC | PRN

## 2020-10-22 MED ORDER — SODIUM CHLORIDE 0.45 % IV SOLN: INTRAVENOUS | Status: DC

## 2020-10-22 MED ORDER — DIPHENHYDRAMINE HCL 50 MG/ML IJ SOLN
25.0000 mg | Freq: Once | INTRAMUSCULAR | Status: AC
  Administered 2020-10-22: 25 mg via INTRAVENOUS
  Filled 2020-10-22: qty 1

## 2020-10-22 MED ORDER — MIRTAZAPINE 15 MG PO TABS
7.5000 mg | ORAL_TABLET | Freq: Every day | ORAL | Status: DC
  Administered 2020-10-22 – 2020-10-23 (×2): 7.5 mg via ORAL
  Filled 2020-10-22 (×2): qty 1

## 2020-10-22 MED ORDER — HYDROMORPHONE HCL 2 MG/ML IJ SOLN
2.0000 mg | INTRAMUSCULAR | Status: DC
  Filled 2020-10-22: qty 1

## 2020-10-22 MED ORDER — ONDANSETRON HCL 4 MG/2ML IJ SOLN
4.0000 mg | INTRAMUSCULAR | Status: DC | PRN
  Administered 2020-10-22: 4 mg via INTRAVENOUS
  Filled 2020-10-22: qty 2

## 2020-10-22 MED ORDER — SODIUM CHLORIDE 0.9% FLUSH: 9.0000 mL | INTRAVENOUS | Status: DC | PRN

## 2020-10-22 MED ORDER — VOXELOTOR 500 MG PO TABS
1500.0000 mg | ORAL_TABLET | Freq: Every day | ORAL | Status: DC
  Administered 2020-10-24: 1500 mg via ORAL

## 2020-10-22 MED ORDER — HYDROMORPHONE HCL 1 MG/ML IJ SOLN
1.0000 mg | Freq: Once | INTRAMUSCULAR | Status: AC
  Administered 2020-10-22: 1 mg via INTRAVENOUS
  Filled 2020-10-22: qty 1
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
## 2020-10-22 NOTE — ED Triage Notes (Signed)
States he is in s sickle cell crisis since this morning, also slipped and fell in the shower and is having pain in his L lower back now. States 'my kidneys is failing.' States urine is tea colored, small amounts. One episode of emesis at home this AM.

## 2020-10-22 NOTE — ED Notes (Addendum)
Pt argumentative with this nurse before she attempted to start IV. Pt states he is a hard stick and that "they need to use the vein thing." Pt then stated "You act like you can't hear. Hurry up then come on come on do it do it!" This nurse exited the room and IV team consult has been placed. Provider notified and aware.

## 2020-10-22 NOTE — ED Provider Notes (Signed)
Emergency Medicine Provider Triage Evaluation Note  [name] [name]. , a 40 y.o. male  was evaluated in triage.  Pt complains of sickle cell pain in lower back and legs that began this morning. He also complains of nausea and vomiting. Pt states he feels "Dehydrated" as his urine is tea colored. Pt also states he things his hgb is low. He states he fell in the shower and landed directly onto his buttocks causing pain to same. He is out of his 10 mg oxycodone - ran out about 2-3 days ago.   Review of Systems  Positive: + sickle cell pain, coccyx pain, nausea, vomiting Negative: - fevers, chills  Physical Exam  BP 124/90 (BP Location: Left Arm)   Pulse 91   Temp 98.5 F (36.9 C) (Oral)   Resp 20   Ht '5\' 3"'$  (1.6 m)   Wt 56.7 kg   SpO2 95%   BMI 22.14 kg/m  Gen:   Awake, no distress   Resp:  Normal effort  MSK:   Moves extremities without difficulty  Other:  + TTP to coccyx  Medical Decision Making  Medically screening exam initiated at 2:35 PM.  Appropriate orders placed.  [name] [name]. was informed that the remainder of the evaluation will be completed by another provider, this initial triage assessment does not replace that evaluation, and the importance of remaining in the ED until their evaluation is complete.  Currently nauseated. Pt has hx of qtc prolongation. EKG ordered prior to zofran. Sickle cell crisis + pain to coccyx s/p fall today.   Eustaquio Maize, PA-C 10/22/20 1439    Blanchie Dessert, MD 10/25/20 [phone]

## 2020-10-22 NOTE — H&P (Signed)
History and Physical    Kendre A [employer]. 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  Of note, patient was hospitalized at [facility] hospital from 8/9 until 8/13 for suspected acute sickle cell crisis.  Patient received intravenous fluids and opiate-based analgesics as well as a 1 unit packed red blood cell transfusion during the hospitalization before being discharged on 8/13.  Patient explains that for approximately past 24 hours he has developed a rapidly progressive low back pain.  This pain is severe in intensity, sharp in quality and worse with movement.  Patient attempted to take his home regimen of oxycodone for pain control but was unsuccessful.    This morning, patient explains he also began to experience episodes of lightheadedness that he attributes to his severe pain.  Patient states that while he was in the shower he had an episode of lightheadedness and stumbled and fell without loss of consciousness.  Patient complains of severe buttock pain since this fall.  Patient also complains of associated dark urine in the past several days.  Due to severe low back pain and fall patient presents to [facility] emergency department for evaluation.  Upon evaluation in the emergency department patient is clinically felt to be suffering from an acute sickle cell crisis.  Patient was provided with several doses of intravenous Dilaudid without effect.  Patient was initiated on intravenous half-normal saline. Review of Systems:   ROS  Past  Medical History:  Diagnosis Date   Abscess of right iliac fossa 09/24/2018   required Perc Drain 09/24/2018   Arachnoid Cyst of brain bilaterally    "2 really small ones in the back of my head; inside; saw them w/MRI" (09/25/2012)   Bacterial pneumonia ~ 2012   "caught it here in the hospital" (09/25/2012)   Chronic kidney disease    "from my sickle cell" (09/25/2012)   CKD (chronic kidney disease), stage II    Colitis 04/19/2017   CT scan abd/ pelvis   GERD (gastroesophageal reflux disease)    "after I eat alot of spicey foods" (09/25/2012)   Gynecomastia, male 07/10/2012   History of blood transfusion    "always related to sickle cell crisis" (09/25/2012)   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   Noted in noted from Hematology notes at [place]    "take RX qd to prevent them" (09/25/2012)   Opioid dependence with withdrawal ([place]) 08/30/2012   Renal insufficiency    Sickle cell anemia (HCC)    Sickle cell crisis ([place]) 09/25/2012   Sickle cell nephropathy ([place]) 07/10/2012   Sinus tachycardia    Tachycardia with heart rate 121-140 beats per minute with ambulation 08/04/2016    Past Surgical History:  Procedure Laterality Date   ABCESS DRAINAGE     CHOLECYSTECTOMY  ~ 2012   COLONOSCOPY N/A 04/23/2017   Procedure: COLONOSCOPY;  Surgeon: Irene Shipper, MD;  Location: [facility];  Service: Endoscopy;  Laterality: N/A;   IR FLUORO GUIDE CV LINE RIGHT  12/17/2016   IR RADIOLOGIST EVAL & MGMT  10/02/2018  IR RADIOLOGIST EVAL & MGMT  10/15/2018   IR REMOVAL TUN CV CATH W/O FL  12/21/2016   IR US GUIDE VASC ACCESS RIGHT  12/17/2016   spleenectomy       reports that he has been smoking cigarettes. He has been smoking an average of .1 packs per day. He has never used smokeless tobacco. He reports current alcohol use. He reports current drug use. Drug: Marijuana.  Allergies  Allergen Reactions   Nsaids Other (See Comments)    Kidney disease   Morphine And Related  Other (See Comments)    "Real bad headaches"     Family History  Problem Relation Age of Onset   Breast cancer Mother      Prior to Admission medications   Medication Sig Start Date End Date Taking? Authorizing Provider  acetaminophen (TYLENOL) 650 MG CR tablet Take 650 mg by mouth every 8 (eight) hours as needed for pain.    [provider]  Cholecalciferol (VITAMIN D3) 50 MCG (2000 UT) TABS Take 1 each by mouth daily. Patient not taking: No sig reported 01/13/20   [name], FNP  Deferiprone, Twice Daily, (FERRIPROX TWICE-A-DAY) 1000 MG TABS Take 1,000 mg by mouth 2 (two) times daily.    [provider]  folic acid (FOLVITE) 1 MG tablet TAKE 1 TABLET BY MOUTH EVERY DAY Patient taking differently: Take 1 mg by mouth daily. 03/22/20   [name], FNP  hydroxyurea (HYDREA) 500 MG capsule Take 500 mg by mouth daily. 05/07/20   [provider]  LOKELMA 10 g PACK packet Take 10 g by mouth every other day. 03/18/20   [provider]  mirtazapine (REMERON) 7.5 MG tablet Take 1 tablet (7.5 mg total) by mouth at bedtime. 08/12/20   [name], FNP  nortriptyline (PAMELOR) 25 MG capsule Take 25 mg by mouth 2 (two) times daily. 09/07/20   [provider]  OXBRYTA 500 MG TABS tablet Take 1,500 mg by mouth daily. 03/26/20   [provider]  oxyCODONE (OXYCONTIN) 15 mg 12 hr tablet Take 1 tablet (15 mg total) by mouth every 12 (twelve) hours. 09/25/20   Elwyn Reach, MD  Oxycodone HCl 10 MG TABS Take 1 tablet (10 mg total) by mouth every 6 (six) hours as needed (severe pain). 09/25/20   Elwyn Reach, MD  sodium bicarbonate 650 MG tablet Take 1,950 mg by mouth 3 (three) times daily. 11/07/18   [provider]    Physical Exam: Vitals:   10/22/20 1718 10/22/20 1810 10/22/20 1900 10/22/20 1958  BP: 130/64 (!) 113/59 118/75 116/75  Pulse: 80 74 69 64  Resp: '16 14  13  '$ Temp:      TempSrc:      SpO2: 99% 100% 100%  100%  Weight:      Height:        Constitutional: Awake alert and oriented x3, patient is in distress due to pain. Skin: no rashes, no lesions, good skin turgor noted. Eyes: Pupils are equally reactive to light.  No evidence of scleral icterus or conjunctival pallor.  ENMT: Moist mucous membranes noted.  Posterior pharynx clear of any exudate or lesions.   Neck: normal, supple, no masses, no thyromegaly.  No evidence of jugular venous distension.   Respiratory: clear to auscultation bilaterally, no wheezing, no crackles. Normal respiratory effort. No accessory muscle use.  Cardiovascular: Regular rate and rhythm, no murmurs / rubs / gallops. No extremity edema. 2+ pedal pulses.  No carotid bruits.  Chest:   Nontender without crepitus or deformity.   Back:   Significant low back tenderness without evidence of crepitus or deformity. Abdomen: Abdomen is soft and nontender.  No evidence of intra-abdominal masses.  Positive bowel sounds noted in all quadrants.   Musculoskeletal: No joint deformity upper and lower extremities. Good ROM, no contractures. Normal muscle tone.  Neurologic: CN 2-12 grossly intact. Sensation intact.  Patient moving all 4 extremities spontaneously.  Patient is following all commands.  Patient is responsive to verbal stimuli.   Psychiatric: Patient exhibits normal mood with appropriate affect.  Patient seems to possess insight as to their current situation.     Labs on Admission: I have personally reviewed following labs and imaging studies -   CBC: Recent Labs  Lab 10/22/20 1706  WBC 17.6*  NEUTROABS 11.8*  HGB 8.0*  HCT 23.9*  MCV 106.7*  PLT [id number]   Basic Metabolic Panel: Recent Labs  Lab 10/22/20 1706  NA 141  K 4.7  CL 110  CO2 19*  GLUCOSE 100*  BUN 51*  CREATININE 2.95*  CALCIUM 8.1*   GFR: Estimated Creatinine Clearance: 27.5 mL/min (A) (by C-G formula based on SCr of 2.95 mg/dL (H)). Liver Function Tests: Recent Labs  Lab 10/22/20 1706   AST 91*  ALT 81*  ALKPHOS 289*  BILITOT 0.8  PROT 7.2  ALBUMIN 3.5   No results for input(s): LIPASE, AMYLASE in the last 168 hours. No results for input(s): AMMONIA in the last 168 hours. Coagulation Profile: No results for input(s): INR, PROTIME in the last 168 hours. Cardiac Enzymes: Recent Labs  Lab 10/22/20 1706  CKTOTAL 57   BNP (last 3 results) No results for input(s): PROBNP in the last 8760 hours. HbA1C: No results for input(s): HGBA1C in the last 72 hours. CBG: No results for input(s): GLUCAP in the last 168 hours. Lipid Profile: No results for input(s): CHOL, HDL, LDLCALC, TRIG, CHOLHDL, LDLDIRECT in the last 72 hours. Thyroid Function Tests: No results for input(s): TSH, T4TOTAL, FREET4, T3FREE, THYROIDAB in the last 72 hours. Anemia Panel: Recent Labs    10/22/20 1706  RETICCTPCT 5.8*   Urine analysis:    Component Value Date/Time   COLORURINE YELLOW 10/22/2020 1754   APPEARANCEUR CLEAR 10/22/2020 1754   LABSPEC 1.015 10/22/2020 1754   PHURINE 7.0 10/22/2020 1754   GLUCOSEU 100 (A) 10/22/2020 1754   HGBUR SMALL (A) 10/22/2020 1754   HGBUR small 03/10/2010 1445   BILIRUBINUR NEGATIVE 10/22/2020 1754   BILIRUBINUR neg 09/30/2020 1117   KETONESUR NEGATIVE 10/22/2020 1754   PROTEINUR >300 (A) 10/22/2020 1754   UROBILINOGEN 0.2 09/30/2020 1117   UROBILINOGEN 0.2 05/11/2017 1335   NITRITE NEGATIVE 10/22/2020 1754   LEUKOCYTESUR NEGATIVE 10/22/2020 1754    Radiological Exams on Admission - Personally Reviewed: DG Sacrum/Coccyx  Result Date: 10/22/2020 CLINICAL DATA:  Status post fall.  Sickle cell crisis. EXAM: SACRUM AND COCCYX - 2+ VIEW COMPARISON:  CT abdomen and pelvis 10/02/2018. FINDINGS: The bones are heterogeneous in slightly osteopenic which limits evaluation. There is no definite acute fracture or dislocation identified. There is heterogeneity and focal sclerosis within the bilateral femoral heads similar to the prior examination compatible  with chronic hip avascular necrosis changes. There is also some focal sclerosis measuring 4.1 x 1.2 cm in the right iliac bone which appears new from the prior examination. The joint spaces appear well maintained. There is some calcifications in the right abdomen measuring up to 9 mm. These  may be related to renal calcifications. IMPRESSION: 1. Examination is limited secondary to chronic sickle cell changes throughout the osseous structures. No definite acute fracture or dislocation identified. 2. Stable chronic changes of bilateral hip avascular necrosis. 3. Questionable right renal calcifications. 4. New focal area of sclerosis in the right iliac bone, indeterminate. Electronically Signed   By: Ronney Asters M.D.   On: 10/22/2020 15:30   DG Chest Portable 1 View  Result Date: 10/22/2020 CLINICAL DATA:  Chest pain, short of breath, history of sickle cell disease EXAM: PORTABLE CHEST 1 VIEW COMPARISON:  07/30/2020 FINDINGS: Frontal view of the chest demonstrates a stable cardiac silhouette. No acute airspace disease, effusion, or pneumothorax. Diffuse bony changes consistent with known history of sickle cell disease, stable. Chronic bilateral humeral head avascular necrosis, right greater than left. IMPRESSION: 1. No acute intrathoracic process. 2. Stable bony changes related to known sickle cell disease. Electronically Signed   By: Randa Ngo M.D.   On: 10/22/2020 15:48    EKG: Personally reviewed.  Rhythm is normal sinus rhythm with heart rate of 88 bpm.  No dynamic ST segment changes appreciated.  Assessment/Plan Principal Problem:   Acute sickle cell crisis [facility])  Patient presenting with severe low back pain that patient states is typical of an acute sickle cell crisis for him. Clinically, patient is suspected to be suffering from an acute sickle cell crisis. Hydrating patient with intravenous fluids Obtaining urinalysis, COVID-19 PCR testing, chest x-ray to evaluate for any evidence of  underlying infection. Patient historically is required extremely high doses of as needed opiate-based analgesics.   Case discussed with pharmacy and patient will be initiated on a PCA pump with dosing similar to what patient received during last hospitalization.  Active Problems:   CKD (chronic kidney disease), stage IV (HCC)  Longstanding known history of chronic kidney disease stage IV. Strict intake and output monitoring Creatinine near baseline Minimizing nephrotoxic agents as much as possible Serial chemistries to monitor renal function and electrolytes    Leukocytosis  Patient has known history of chronic leukocytosis Obtain urinalysis, chest x-ray, COVID-19 testing    Fall at home, initial encounter  Patient fell in shower at home morning of 9/9 that he claims happened due to feeling transiently lightheaded. X-ray of the sacrum reveals no evidence of fracture No focal neurologic deficits on examination    Marijuana use  Counseling on cessation  Code Status:  Full code  code status decision has been confirmed with: patient Family Communication: Deferred   Status is: Inpatient  Remains inpatient appropriate because:Ongoing active pain requiring inpatient pain management, IV treatments appropriate due to intensity of illness or inability to take PO, and Inpatient level of care appropriate due to severity of illness  Dispo: The patient is from: Home              Anticipated d/c is to: Home              Patient currently is not medically stable to d/c.   Difficult to place patient No        Vernelle Emerald MD [facility] Pager [phone]  If 7PM-7AM, please contact night-coverage [email or web address] Use universal  password for that web site. If you do not have the password, please call the hospital operator.  10/22/2020, 10:00 PM

## 2020-10-22 NOTE — ED Notes (Signed)
IV team at the bedside. 

## 2020-10-22 NOTE — ED Notes (Signed)
Pt refused to keep his gown on and be transferred on the tele monitor to room upstairs. He also wanted fluids stopped until he got upstairs. He stated "they start new shit upstairs anyways and they always let me keep my own stuff on"

## 2020-10-23 MED ORDER — HYDROMORPHONE 1 MG/ML IV SOLN
INTRAVENOUS | Status: DC
  Administered 2020-10-23: 3 mg via INTRAVENOUS
  Administered 2020-10-23: 1.5 mg via INTRAVENOUS
  Administered 2020-10-23: 3.5 mg via INTRAVENOUS
  Administered 2020-10-23: 30 mg via INTRAVENOUS
  Administered 2020-10-23: 3 mg via INTRAVENOUS
  Administered 2020-10-23: 2 mg via INTRAVENOUS
  Administered 2020-10-23 – 2020-10-24 (×2): 4 mg via INTRAVENOUS
  Filled 2020-10-23: qty 30

## 2020-10-23 NOTE — Progress Notes (Signed)
Subjective: 40 year old well-known to our service with history of sickle cell disease who was admitted with sickle cell painful crisis.  Patient also has chronic kidney disease stage III as well as chronic pain syndrome.  He is still having pain at 8 out of 10.  Denied any nausea vomiting or diarrhea.  He is hemodynamically stable however.  Patient was taking his medication without relief at home.  He is having severe back pain still.  Also some lightheadedness with.  Objective: Vital signs in last 24 hours: Temp:  [98.5 F (36.9 C)-98.6 F (37 C)] 98.5 F (36.9 C) (09/10 0457) Pulse Rate:  [64-91] 78 (09/10 0457) Resp:  [13-20] 14 (09/10 0457) BP: (98-130)/(59-96) 98/71 (09/10 0457) SpO2:  [95 %-100 %] 99 % (09/10 0457) FiO2 (%):  [3 %] 3 % (09/10 0358) Weight:  [51.4 kg-56.7 kg] 51.4 kg (09/10 0028) Weight change:     Intake/Output from previous day: 09/09 0701 - 09/10 0700 In: -  Out: 400 [Urine:400] Intake/Output this shift: No intake/output data recorded.  General appearance: alert, cooperative, appears stated age, and no distress Neck: no adenopathy, no carotid bruit, no JVD, supple, symmetrical, trachea midline, and thyroid not enlarged, symmetric, no tenderness/mass/nodules Back: symmetric, no curvature. ROM normal. No CVA tenderness. Resp: clear to auscultation bilaterally Cardio: regular rate and rhythm, S1, S2 normal, no murmur, click, rub or gallop GI: soft, non-tender; bowel sounds normal; no masses,  no organomegaly Extremities: extremities normal, atraumatic, no cyanosis or edema Neurologic: Grossly normal  Lab Results: Recent Labs    10/22/20 1706  WBC 17.6*  HGB 8.0*  HCT 23.9*  PLT 351   BMET Recent Labs    10/22/20 1706  NA 141  K 4.7  CL 110  CO2 19*  GLUCOSE 100*  BUN 51*  CREATININE 2.95*  CALCIUM 8.1*    Studies/Results: DG Sacrum/Coccyx  Result Date: 10/22/2020 CLINICAL DATA:  Status post fall.  Sickle cell crisis. EXAM: SACRUM AND  COCCYX - 2+ VIEW COMPARISON:  CT abdomen and pelvis 10/02/2018. FINDINGS: The bones are heterogeneous in slightly osteopenic which limits evaluation. There is no definite acute fracture or dislocation identified. There is heterogeneity and focal sclerosis within the bilateral femoral heads similar to the prior examination compatible with chronic hip avascular necrosis changes. There is also some focal sclerosis measuring 4.1 x 1.2 cm in the right iliac bone which appears new from the prior examination. The joint spaces appear well maintained. There is some calcifications in the right abdomen measuring up to 9 mm. These may be related to renal calcifications. IMPRESSION: 1. Examination is limited secondary to chronic sickle cell changes throughout the osseous structures. No definite acute fracture or dislocation identified. 2. Stable chronic changes of bilateral hip avascular necrosis. 3. Questionable right renal calcifications. 4. New focal area of sclerosis in the right iliac bone, indeterminate. Electronically Signed   By: Ronney Asters M.D.   On: 10/22/2020 15:30   DG Chest Portable 1 View  Result Date: 10/22/2020 CLINICAL DATA:  Chest pain, short of breath, history of sickle cell disease EXAM: PORTABLE CHEST 1 VIEW COMPARISON:  07/30/2020 FINDINGS: Frontal view of the chest demonstrates a stable cardiac silhouette. No acute airspace disease, effusion, or pneumothorax. Diffuse bony changes consistent with known history of sickle cell disease, stable. Chronic bilateral humeral head avascular necrosis, right greater than left. IMPRESSION: 1. No acute intrathoracic process. 2. Stable bony changes related to known sickle cell disease. Electronically Signed   By: Diana Eves.D.  On: 10/22/2020 15:48    Medications: I have reviewed the patient's current medications.  Assessment/Plan: 40 year old gentleman with sickle cell disease admitted with sickle cell pain.  #1 sickle cell painful crisis: Patient  will be maintained on Dilaudid PCA, no Toradol, IV fluids and supportive care.  Monitor response.  #2 chronic kidney disease stage IV: Appears to be at baseline.  Continue supportive care.  #3 fall at home: Patient said this triggered the pain in his back.  No fractures.  Continue supportive care.  May give muscle relaxants if pain persist.  #4 leukocytosis: Secondary to vaso-occlusive disease.  #5 tobacco and marijuana abuse: Counseling provided.   LOS: 1 day   Janissa Bertram,LAWAL 10/23/2020, 9:01 AM

## 2020-10-23 NOTE — Progress Notes (Signed)
Patient is refusing to have Deferiprone (home medication) bottle sent down to pharmacy. He was notified that this is a strict policy and that it is very important that we dispense his medication for his on health and safety. Patient understands, but is still refusing to have medication sent down to pharmacy. He reports he takes this medication at home and he knows how to take it while he is here. AC and pharmacy have been notified.Roderick Pee

## 2020-10-23 NOTE — Plan of Care (Signed)

## 2020-10-24 MED ORDER — OXYCODONE HCL ER 15 MG PO T12A
15.0000 mg | EXTENDED_RELEASE_TABLET | Freq: Two times a day (BID) | ORAL | Status: DC
  Administered 2020-10-24: 15 mg via ORAL
  Filled 2020-10-24: qty 1

## 2020-10-24 MED ORDER — OXYCODONE HCL 5 MG PO TABS
10.0000 mg | ORAL_TABLET | Freq: Once | ORAL | Status: AC
  Administered 2020-10-24: 10 mg via ORAL
  Filled 2020-10-24: qty 2

## 2020-10-24 NOTE — Discharge Summary (Signed)
Physician Discharge Summary  Patient ID: [name] [name]. MRN: [id number] DOB/AGE: 38-Dec-1982 38 y.o.  Admit date: 10/22/2020 Discharge date: 10/24/2020  Admission Diagnoses:  Discharge Diagnoses:  Principal Problem:   Acute sickle cell crisis ([place]) Active Problems:   CKD (chronic kidney disease), stage IV ([place])   Leukocytosis   Fall at home, initial encounter   Marijuana use   Discharged Condition: good  Hospital Course: Patient admitted after he had a fall with injury to his back.  He had a flareup of his sickle cell crisis.  Was treated with IV Dilaudid PCA, No Toradol but given IV fluids.  Patient also had chronic kidney disease stage IV and renal function was stable for the most part.  His pain improved with treatment overnight.  He was able to walk to the bathroom and outside his room.  He felt much better and requests discharge home.  Patient discharged home to resume his home regimen.  Counseling provided for marijuana.  Consults: None  Significant Diagnostic Studies: labs: Serial CBCs and CMP is checked.  Patient is at baseline renal function  Treatments: IV hydration and analgesia: acetaminophen and Dilaudid  Discharge Exam: Blood pressure 113/78, pulse (!) 101, temperature 98.7 F (37.1 C), temperature source Oral, resp. rate (!) 8, height '5\' 3"'$  (1.6 m), weight 51.4 kg, SpO2 100 %. General appearance: alert, cooperative, appears stated age, and no distress Neck: no adenopathy, no carotid bruit, no JVD, supple, symmetrical, trachea midline, and thyroid not enlarged, symmetric, no tenderness/mass/nodules Back: symmetric, no curvature. ROM normal. No CVA tenderness. Cardio: regular rate and rhythm, S1, S2 normal, no murmur, click, rub or gallop GI: soft, non-tender; bowel sounds normal; no masses,  no organomegaly Extremities: extremities normal, atraumatic, no cyanosis or edema Pulses: 2+ and symmetric  Disposition: Discharge disposition: 01-Home or Self  Care       Discharge Instructions     Diet - low sodium heart healthy   Complete by: As directed    Increase activity slowly   Complete by: As directed       Allergies as of 10/24/2020       Reactions   Nsaids Other (See Comments)   Kidney disease   Morphine And Related Other (See Comments)   "Real bad headaches"         Medication List     STOP taking these medications    Vitamin D3 50 MCG (2000 UT) Tabs       TAKE these medications    acetaminophen 650 MG CR tablet Commonly known as: TYLENOL Take 650 mg by mouth every 8 (eight) hours as needed for pain.   Ferriprox Twice-A-Day 1000 MG Tabs Generic drug: Deferiprone (Twice Daily) Take 1,000 mg by mouth 2 (two) times daily.   folic acid 1 MG tablet Commonly known as: FOLVITE TAKE 1 TABLET BY MOUTH EVERY DAY   hydroxyurea 500 MG capsule Commonly known as: HYDREA Take 500 mg by mouth daily.   Lokelma 10 g Pack packet Generic drug: sodium zirconium cyclosilicate Take 10 g by mouth every other day.   mirtazapine 7.5 MG tablet Commonly known as: REMERON Take 1 tablet (7.5 mg total) by mouth at bedtime.   nortriptyline 25 MG capsule Commonly known as: PAMELOR Take 25 mg by mouth 2 (two) times daily.   Oxbryta 500 MG Tabs tablet Generic drug: voxelotor Take 1,500 mg by mouth daily.   Oxycodone HCl 10 MG Tabs Take 1 tablet (10 mg total) by mouth every 6 (six)  hours as needed (severe pain).   OxyCONTIN 15 mg 12 hr tablet Generic drug: oxyCODONE Take 1 tablet (15 mg total) by mouth every 12 (twelve) hours.   sodium bicarbonate 650 MG tablet Take 1,950 mg by mouth 3 (three) times daily.         SignedBarbette Merino 10/24/2020, 12:13 PM

## 2020-10-24 NOTE — Progress Notes (Signed)
Pt requesting to be discharged today. Pt spoke with Dr Jonelle Sidle at bedside. Discharge orders placed.  Paperwork printed and reviewed with pt. PIV removed.  No needs at home.  Awaiting pt's ride.

## 2020-10-25 ENCOUNTER — Other Ambulatory Visit: Payer: Self-pay | Admitting: Internal Medicine

## 2020-10-25 ENCOUNTER — Other Ambulatory Visit (HOSPITAL_COMMUNITY): Payer: Self-pay

## 2020-10-25 ENCOUNTER — Telehealth: Payer: Self-pay

## 2020-10-25 DIAGNOSIS — G894 Chronic pain syndrome: Secondary | ICD-10-CM

## 2020-10-25 DIAGNOSIS — D5700 Hb-SS disease with crisis, unspecified: Secondary | ICD-10-CM

## 2020-10-25 NOTE — Telephone Encounter (Signed)
Transition Care Management Unsuccessful Follow-up Telephone Call  Date of discharge and from where:  10/24/20 from [facility]  Attempts:  1st Attempt  Reason for unsuccessful TCM follow-up call:  Per Patient, not a good time-Request follow up call tomorrow.  Thea Silversmith, RN, MSN, BSN, [place] Care Management Coordinator [phone]

## 2020-10-27 ENCOUNTER — Other Ambulatory Visit (HOSPITAL_COMMUNITY): Payer: Self-pay

## 2020-10-28 ENCOUNTER — Non-Acute Institutional Stay (HOSPITAL_COMMUNITY)
Admission: RE | Admit: 2020-10-28 | Discharge: 2020-10-28 | Disposition: A | Discharge status: Home / Self Care | Payer: Medicare Other | Source: Ambulatory Visit | Attending: Internal Medicine | Admitting: Internal Medicine

## 2020-10-28 ENCOUNTER — Other Ambulatory Visit: Payer: Self-pay

## 2020-10-28 DIAGNOSIS — D571 Sickle-cell disease without crisis: Secondary | ICD-10-CM | POA: Insufficient documentation

## 2020-10-28 LAB — CBC
HCT: 27.5 % — ABNORMAL LOW (ref 39.0–52.0)
Hemoglobin: 9.5 g/dL — ABNORMAL LOW (ref 13.0–17.0)
MCH: 36.7 pg — ABNORMAL HIGH (ref 26.0–34.0)
MCHC: 34.5 g/dL (ref 30.0–36.0)
MCV: 106.2 fL — ABNORMAL HIGH (ref 80.0–100.0)
Platelets: 321 10*3/uL (ref 150–400)
RBC: 2.59 MIL/uL — ABNORMAL LOW (ref 4.22–5.81)
RDW: 23.9 % — ABNORMAL HIGH (ref 11.5–15.5)
WBC: 14.9 10*3/uL — ABNORMAL HIGH (ref 4.0–10.5)
nRBC: 0 % (ref 0.0–0.2)

## 2020-10-28 MED ORDER — DARBEPOETIN ALFA 150 MCG/0.3ML IJ SOSY
150.0000 ug | PREFILLED_SYRINGE | INTRAMUSCULAR | Status: DC
  Filled 2020-10-28: qty 0.3

## 2020-10-28 NOTE — Progress Notes (Signed)
PATIENT CARE CENTER NOTE       Diagnosis: Sickle Cell Anemia      Provider: Levora Angel, NP     Procedure: Lab draw     Note:  Patient arrived for lab draw and Aranesp injection. Per order, patient to received Aranesp for Hemoglobin less than 7.0. Patient's labs drawn and Hemoglobin was 9.5. Aranesp not given. Patient to come back in 2 weeks for lab draw and Aranesp injection if necessary. Patient alert, oriented and ambulatory at discharge.

## 2020-10-29 ENCOUNTER — Telehealth (HOSPITAL_COMMUNITY): Payer: Self-pay | Admitting: Internal Medicine

## 2020-10-29 ENCOUNTER — Other Ambulatory Visit: Payer: Self-pay | Admitting: Family Medicine

## 2020-10-29 ENCOUNTER — Other Ambulatory Visit (HOSPITAL_COMMUNITY): Payer: Self-pay

## 2020-10-29 ENCOUNTER — Other Ambulatory Visit: Payer: Self-pay | Admitting: Internal Medicine

## 2020-10-29 DIAGNOSIS — G894 Chronic pain syndrome: Secondary | ICD-10-CM

## 2020-10-29 DIAGNOSIS — D5700 Hb-SS disease with crisis, unspecified: Secondary | ICD-10-CM

## 2020-10-29 MED ORDER — OXYCODONE HCL ER 15 MG PO T12A
15.0000 mg | EXTENDED_RELEASE_TABLET | Freq: Two times a day (BID) | ORAL | 0 refills | Status: DC
  Filled 2020-10-29 – 2020-11-03 (×2): qty 60, 30d supply, fill #0

## 2020-10-29 MED ORDER — OXYCODONE HCL 10 MG PO TABS
10.0000 mg | ORAL_TABLET | Freq: Four times a day (QID) | ORAL | 0 refills | Status: AC | PRN
  Filled 2020-10-29: qty 60, 15d supply, fill #0

## 2020-10-29 NOTE — Telephone Encounter (Signed)
Pt states he needs an urgent refill -highlights it must be TODAY:   Rx #: [id number]  Oxycodone HCl 10 MG TABS [id number]   Rx #: [id number]  oxyCODONE (OXYCONTIN) 15 mg 12 hr tablet [id number]   Pharmacy  [facility] [place], [place] [address]  Phone:  [phone]  Fax:  [phone]

## 2020-11-02 ENCOUNTER — Other Ambulatory Visit: Payer: Self-pay | Admitting: Internal Medicine

## 2020-11-02 ENCOUNTER — Telehealth: Payer: Self-pay

## 2020-11-02 NOTE — Telephone Encounter (Signed)
Oxycontin

## 2020-11-03 ENCOUNTER — Other Ambulatory Visit (HOSPITAL_COMMUNITY): Payer: Self-pay

## 2020-11-16 IMAGING — CR DG KNEE COMPLETE 4+V*L*
4 series · 4 of 4 positions shown · non-contrast
Comparison: None.

CLINICAL DATA: Swelling and discomfort

EXAM:
LEFT KNEE - COMPLETE 4+ VIEW

[x knee ap left (1 of 3)]
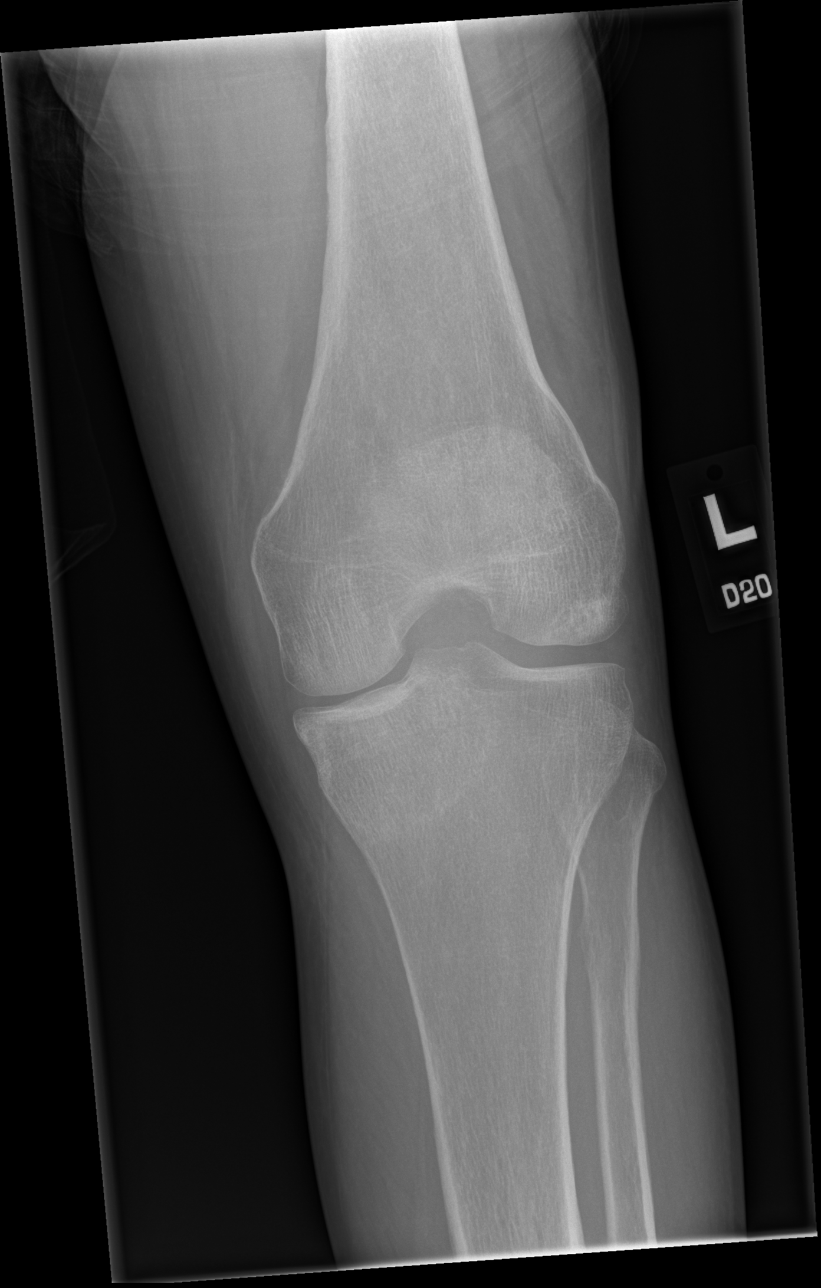

[x knee ap left (2 of 3)]
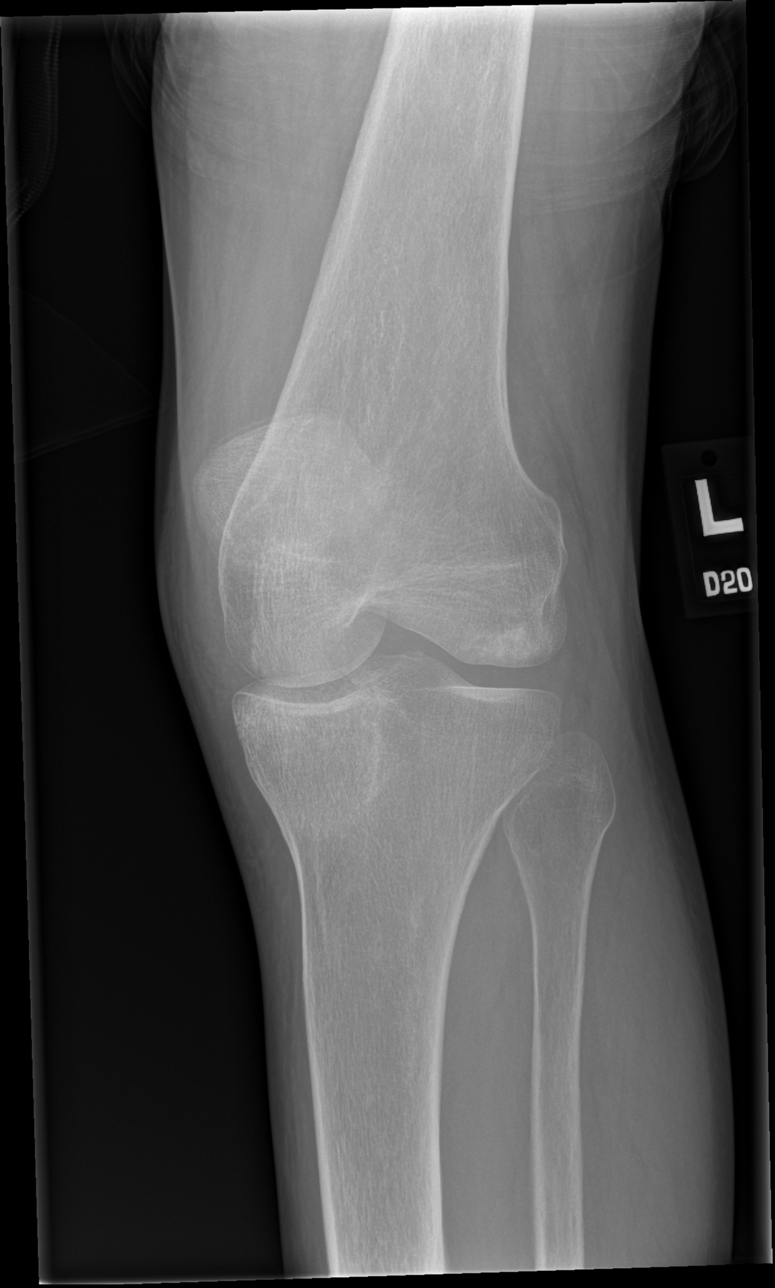

[x knee ap left (3 of 3)]
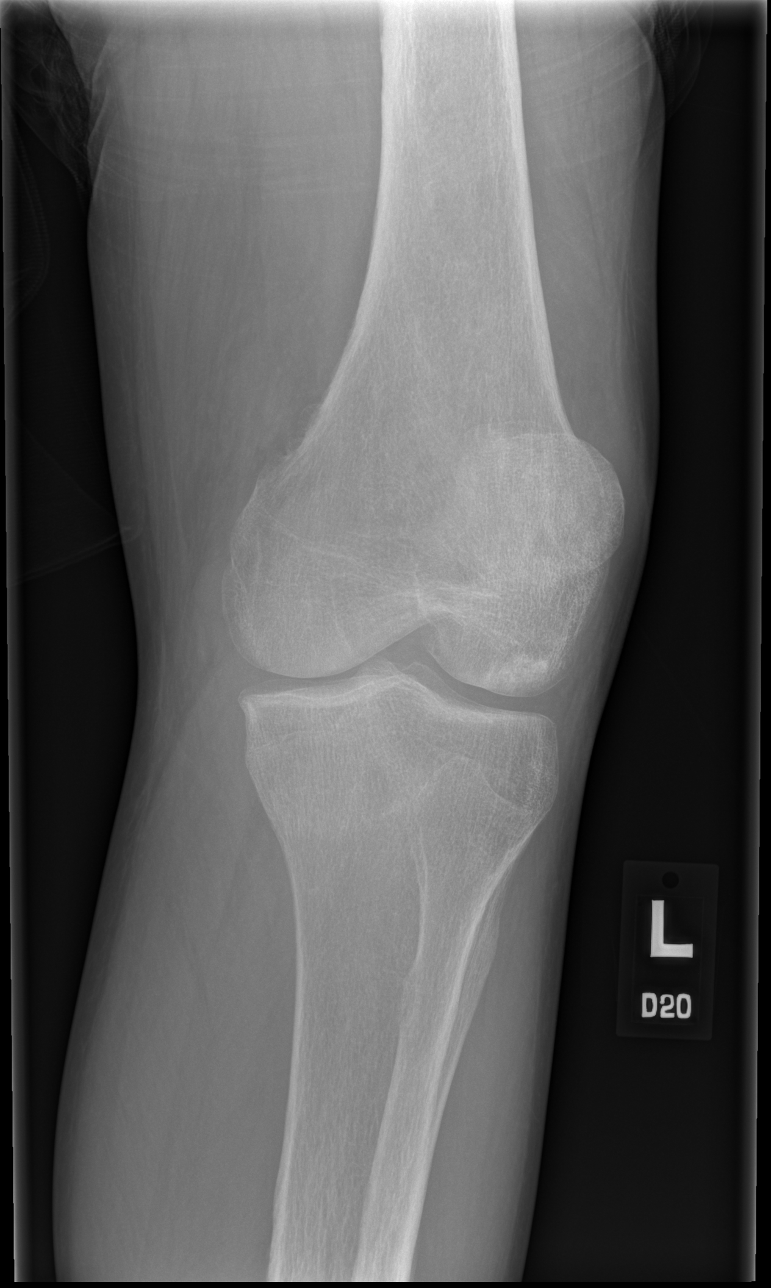

[x knee lat left]
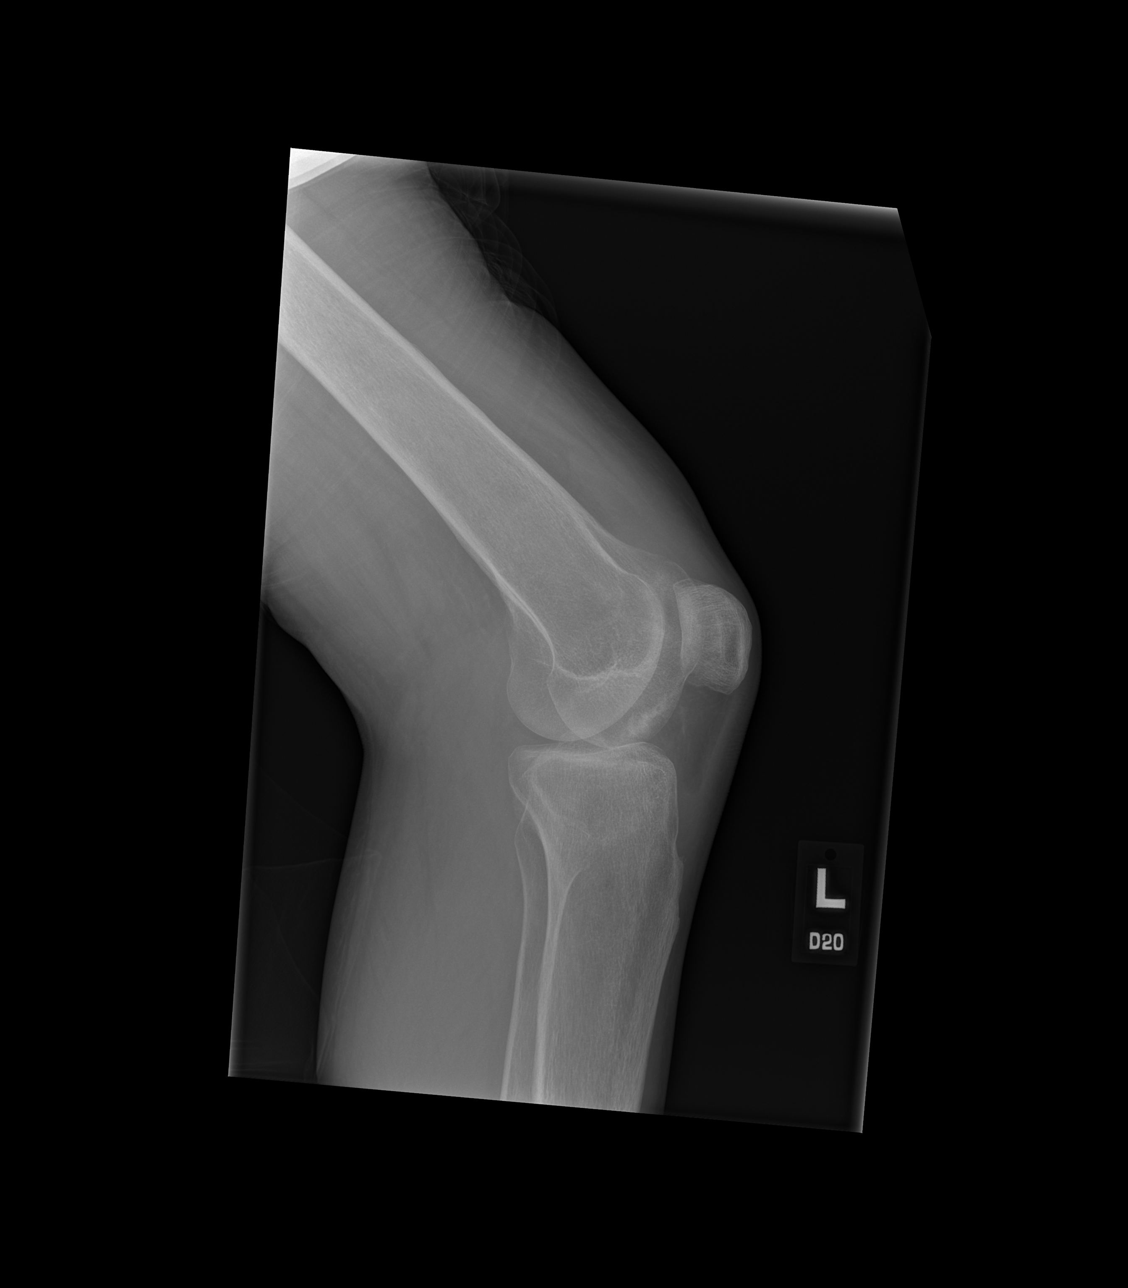

[4 of 4 positions shown; findings below may reference images not displayed]

FINDINGS: No acute displaced fracture or malalignment. Medial joint space
narrowing. Small moderate knee effusion. Subarticular sclerosis and
lucency within the lateral femoral condyle.
IMPRESSION: 1. Positive for knee effusion.
2. No acute fracture
3. Subarticular sclerosis and lucency at the lateral femoral
condyle, possibly representing an area of osteonecrosis.

## 2020-11-18 ENCOUNTER — Other Ambulatory Visit: Payer: Self-pay | Admitting: Family Medicine

## 2020-11-18 ENCOUNTER — Other Ambulatory Visit (HOSPITAL_COMMUNITY): Payer: Self-pay

## 2020-11-18 DIAGNOSIS — G894 Chronic pain syndrome: Secondary | ICD-10-CM

## 2020-11-18 MED ORDER — OXYCODONE HCL 10 MG PO TABS
10.0000 mg | ORAL_TABLET | Freq: Four times a day (QID) | ORAL | 0 refills | Status: DC | PRN
Start: 2020-11-18 — End: 2020-11-23
  Filled 2020-11-18: qty 60, 15d supply, fill #0

## 2020-11-18 NOTE — Progress Notes (Signed)
Reviewed PDMP substance reporting system prior to prescribing opiate medications. No inconsistencies noted.  Meds ordered this encounter  Medications   Oxycodone HCl 10 MG TABS    Sig: Take 1 tablet (10 mg total) by mouth every 6 (six) hours as needed.    Dispense:  60 tablet    Refill:  0    Order Specific Question:   Supervising Provider    Answer:   [name], [name] [[id number]]   [name] [name] Dyamon Sosinski  APRN, MSN, FNP-C [facility] [address]  , [place] [address] [phone]  

## 2020-11-23 ENCOUNTER — Other Ambulatory Visit: Payer: Self-pay | Admitting: Family Medicine

## 2020-11-23 ENCOUNTER — Other Ambulatory Visit (HOSPITAL_COMMUNITY): Payer: Self-pay

## 2020-11-23 ENCOUNTER — Telehealth: Payer: Self-pay

## 2020-11-23 DIAGNOSIS — G894 Chronic pain syndrome: Secondary | ICD-10-CM

## 2020-11-23 MED ORDER — OXYCODONE HCL 10 MG PO TABS: 10.0000 mg | ORAL_TABLET | Freq: Four times a day (QID) | ORAL | 0 refills | Status: DC | PRN

## 2020-11-23 NOTE — Progress Notes (Signed)
Reviewed PDMP substance reporting system prior to prescribing opiate medications. No inconsistencies noted.  Meds ordered this encounter  Medications   Oxycodone HCl 10 MG TABS    Sig: Take 1 tablet (10 mg total) by mouth every 6 (six) hours as needed.    Dispense:  60 tablet    Refill:  0    Order Specific Question:   Supervising Provider    Answer:   [name], [name] [[id number]]   Lorin Gawron Moore Zaydee Aina  APRN, MSN, FNP-C [facility] [address]  [place], [place] [address] [phone]  

## 2020-11-23 NOTE — Telephone Encounter (Signed)
Oxy contin '10mg'$   Per pt said that the pharmacy will be closed on Sunday on due date

## 2020-11-26 ENCOUNTER — Other Ambulatory Visit (HOSPITAL_COMMUNITY): Payer: Self-pay

## 2020-11-26 ENCOUNTER — Telehealth: Payer: Self-pay

## 2020-11-26 NOTE — Telephone Encounter (Signed)
Oxy contin

## 2020-11-27 ENCOUNTER — Other Ambulatory Visit: Payer: Self-pay | Admitting: Family Medicine

## 2020-11-27 ENCOUNTER — Other Ambulatory Visit (HOSPITAL_COMMUNITY): Payer: Self-pay

## 2020-11-27 DIAGNOSIS — D5700 Hb-SS disease with crisis, unspecified: Secondary | ICD-10-CM

## 2020-11-27 DIAGNOSIS — G894 Chronic pain syndrome: Secondary | ICD-10-CM

## 2020-11-27 MED ORDER — OXYCODONE HCL ER 15 MG PO T12A
15.0000 mg | EXTENDED_RELEASE_TABLET | Freq: Two times a day (BID) | ORAL | 0 refills | Status: DC
  Filled 2020-12-03: qty 60, 30d supply, fill #0

## 2020-11-27 NOTE — Progress Notes (Signed)
Reviewed PDMP substance reporting system prior to prescribing opiate medications. No inconsistencies noted.  Meds ordered this encounter  Medications   oxyCODONE (OXYCONTIN) 15 mg 12 hr tablet    Sig: Take 1 tablet (15 mg total) by mouth every 12 (twelve) hours.    Dispense:  60 tablet    Refill:  0    Order Specific Question:   Supervising Provider    Answer:   [name], [name] [[id number]]   [name] Clavin Moore Srinivas Lippman  APRN, MSN, FNP-C [facility] [address]  [place], [place] [address] [phone]  

## 2020-11-30 ENCOUNTER — Ambulatory Visit: Payer: Medicare Other | Admitting: Internal Medicine

## 2020-12-02 ENCOUNTER — Telehealth: Payer: Self-pay

## 2020-12-02 NOTE — Telephone Encounter (Signed)
Oxy Contin 

## 2020-12-03 ENCOUNTER — Other Ambulatory Visit: Payer: Self-pay | Admitting: Family Medicine

## 2020-12-03 ENCOUNTER — Other Ambulatory Visit (HOSPITAL_COMMUNITY): Payer: Self-pay

## 2020-12-03 DIAGNOSIS — G894 Chronic pain syndrome: Secondary | ICD-10-CM

## 2020-12-03 DIAGNOSIS — D5700 Hb-SS disease with crisis, unspecified: Secondary | ICD-10-CM

## 2020-12-03 MED ORDER — OXYCODONE HCL ER 15 MG PO T12A
15.0000 mg | EXTENDED_RELEASE_TABLET | Freq: Two times a day (BID) | ORAL | 0 refills | Status: DC
Start: 2020-12-03 — End: 2020-12-31
  Filled 2021-01-01: qty 60, 30d supply, fill #0

## 2020-12-03 NOTE — Progress Notes (Signed)
Reviewed PDMP substance reporting system prior to prescribing opiate medications. No inconsistencies noted.  Meds ordered this encounter  Medications   oxyCODONE (OXYCONTIN) 15 mg 12 hr tablet    Sig: Take 1 tablet (15 mg total) by mouth every 12 (twelve) hours.    Dispense:  60 tablet    Refill:  0    Order Specific Question:   Supervising Provider    Answer:   [name], [name] [[id number]]   Emmerich Cryer Moore Rasheda Ledger  APRN, MSN, FNP-C [facility] [address]  [place], [place] [address] [phone]  

## 2020-12-07 ENCOUNTER — Emergency Department (HOSPITAL_COMMUNITY): Payer: Medicare Other

## 2020-12-07 ENCOUNTER — Telehealth (HOSPITAL_COMMUNITY): Payer: Self-pay | Admitting: *Deleted

## 2020-12-07 ENCOUNTER — Other Ambulatory Visit: Payer: Self-pay

## 2020-12-07 ENCOUNTER — Inpatient Hospital Stay (HOSPITAL_COMMUNITY)
Admission: EM | Admit: 2020-12-07 | Discharge: 2020-12-10 | DRG: 812 | Disposition: A | Discharge status: Home / Self Care | Payer: Medicare Other | Attending: Internal Medicine | Admitting: Internal Medicine

## 2020-12-07 ENCOUNTER — Encounter (HOSPITAL_COMMUNITY): Payer: Self-pay

## 2020-12-07 ENCOUNTER — Ambulatory Visit: Payer: Medicare Other | Admitting: Family Medicine

## 2020-12-07 DIAGNOSIS — Z79899 Other long term (current) drug therapy: Secondary | ICD-10-CM | POA: Diagnosis not present

## 2020-12-07 DIAGNOSIS — G894 Chronic pain syndrome: Secondary | ICD-10-CM | POA: Diagnosis present

## 2020-12-07 DIAGNOSIS — Z885 Allergy status to narcotic agent status: Secondary | ICD-10-CM

## 2020-12-07 DIAGNOSIS — N08 Glomerular disorders in diseases classified elsewhere: Secondary | ICD-10-CM | POA: Diagnosis present

## 2020-12-07 DIAGNOSIS — N189 Chronic kidney disease, unspecified: Secondary | ICD-10-CM | POA: Diagnosis not present

## 2020-12-07 DIAGNOSIS — R000 Tachycardia, unspecified: Secondary | ICD-10-CM | POA: Diagnosis not present

## 2020-12-07 DIAGNOSIS — Z79891 Long term (current) use of opiate analgesic: Secondary | ICD-10-CM

## 2020-12-07 DIAGNOSIS — D638 Anemia in other chronic diseases classified elsewhere: Secondary | ICD-10-CM | POA: Diagnosis not present

## 2020-12-07 DIAGNOSIS — E875 Hyperkalemia: Secondary | ICD-10-CM | POA: Diagnosis not present

## 2020-12-07 DIAGNOSIS — N184 Chronic kidney disease, stage 4 (severe): Secondary | ICD-10-CM | POA: Diagnosis present

## 2020-12-07 DIAGNOSIS — N179 Acute kidney failure, unspecified: Secondary | ICD-10-CM | POA: Diagnosis present

## 2020-12-07 DIAGNOSIS — D5700 Hb-SS disease with crisis, unspecified: Secondary | ICD-10-CM | POA: Diagnosis not present

## 2020-12-07 DIAGNOSIS — R64 Cachexia: Secondary | ICD-10-CM | POA: Diagnosis not present

## 2020-12-07 DIAGNOSIS — Z681 Body mass index (BMI) 19 or less, adult: Secondary | ICD-10-CM | POA: Diagnosis not present

## 2020-12-07 DIAGNOSIS — D649 Anemia, unspecified: Secondary | ICD-10-CM

## 2020-12-07 DIAGNOSIS — Z8701 Personal history of pneumonia (recurrent): Secondary | ICD-10-CM

## 2020-12-07 DIAGNOSIS — R079 Chest pain, unspecified: Secondary | ICD-10-CM | POA: Diagnosis not present

## 2020-12-07 DIAGNOSIS — D72829 Elevated white blood cell count, unspecified: Secondary | ICD-10-CM | POA: Diagnosis not present

## 2020-12-07 DIAGNOSIS — Z9049 Acquired absence of other specified parts of digestive tract: Secondary | ICD-10-CM | POA: Diagnosis not present

## 2020-12-07 DIAGNOSIS — F17210 Nicotine dependence, cigarettes, uncomplicated: Secondary | ICD-10-CM | POA: Diagnosis not present

## 2020-12-07 DIAGNOSIS — Z20822 Contact with and (suspected) exposure to covid-19: Secondary | ICD-10-CM | POA: Diagnosis not present

## 2020-12-07 DIAGNOSIS — Z886 Allergy status to analgesic agent status: Secondary | ICD-10-CM

## 2020-12-07 DIAGNOSIS — K219 Gastro-esophageal reflux disease without esophagitis: Secondary | ICD-10-CM | POA: Diagnosis not present

## 2020-12-07 LAB — RAPID URINE DRUG SCREEN, HOSP PERFORMED
Amphetamines: NOT DETECTED
Barbiturates: NOT DETECTED
Benzodiazepines: NOT DETECTED
Cocaine: NOT DETECTED
Opiates: POSITIVE — AB
Tetrahydrocannabinol: POSITIVE — AB

## 2020-12-07 LAB — CBC WITH DIFFERENTIAL/PLATELET
Abs Immature Granulocytes: 0.09 10*3/uL — ABNORMAL HIGH (ref 0.00–0.07)
Basophils Absolute: 0.1 10*3/uL (ref 0.0–0.1)
Basophils Relative: 0 %
Eosinophils Absolute: 0 10*3/uL (ref 0.0–0.5)
Eosinophils Relative: 0 %
HCT: 19.4 % — ABNORMAL LOW (ref 39.0–52.0)
Hemoglobin: 6.9 g/dL — CL (ref 13.0–17.0)
Immature Granulocytes: 1 %
Lymphocytes Relative: 24 %
Lymphs Abs: 4.7 10*3/uL — ABNORMAL HIGH (ref 0.7–4.0)
MCH: 38.3 pg — ABNORMAL HIGH (ref 26.0–34.0)
MCHC: 35.6 g/dL (ref 30.0–36.0)
MCV: 107.8 fL — ABNORMAL HIGH (ref 80.0–100.0)
Monocytes Absolute: 1.9 10*3/uL — ABNORMAL HIGH (ref 0.1–1.0)
Monocytes Relative: 10 %
Neutro Abs: 13.1 10*3/uL — ABNORMAL HIGH (ref 1.7–7.7)
Neutrophils Relative %: 65 %
Platelets: 204 10*3/uL (ref 150–400)
RBC: 1.8 MIL/uL — ABNORMAL LOW (ref 4.22–5.81)
WBC: 19.8 10*3/uL — ABNORMAL HIGH (ref 4.0–10.5)
nRBC: 0.3 % — ABNORMAL HIGH (ref 0.0–0.2)

## 2020-12-07 LAB — URINALYSIS, COMPLETE (UACMP) WITH MICROSCOPIC
Bacteria, UA: NONE SEEN
Bilirubin Urine: NEGATIVE
Glucose, UA: 50 mg/dL — AB
Ketones, ur: NEGATIVE mg/dL
Leukocytes,Ua: NEGATIVE
Nitrite: NEGATIVE
Protein, ur: 100 mg/dL — AB
Specific Gravity, Urine: 1.015 (ref 1.005–1.030)
pH: 6 (ref 5.0–8.0)

## 2020-12-07 LAB — COMPREHENSIVE METABOLIC PANEL
ALT: 49 U/L — ABNORMAL HIGH (ref 0–44)
AST: 70 U/L — ABNORMAL HIGH (ref 15–41)
Albumin: 3.5 g/dL (ref 3.5–5.0)
Alkaline Phosphatase: 286 U/L — ABNORMAL HIGH (ref 38–126)
Anion gap: 12 (ref 5–15)
BUN: 51 mg/dL — ABNORMAL HIGH (ref 6–20)
CO2: 18 mmol/L — ABNORMAL LOW (ref 22–32)
Calcium: 8.7 mg/dL — ABNORMAL LOW (ref 8.9–10.3)
Chloride: 108 mmol/L (ref 98–111)
Creatinine, Ser: 3.2 mg/dL — ABNORMAL HIGH (ref 0.61–1.24)
GFR, Estimated: 25 mL/min — ABNORMAL LOW (ref >60)
Glucose, Bld: 126 mg/dL — ABNORMAL HIGH (ref 70–99)
Potassium: 4.9 mmol/L (ref 3.5–5.1)
Sodium: 138 mmol/L (ref 135–145)
Total Bilirubin: 1.6 mg/dL — ABNORMAL HIGH (ref 0.3–1.2)
Total Protein: 7.8 g/dL (ref 6.5–8.1)

## 2020-12-07 LAB — RESP PANEL BY RT-PCR (FLU A&B, COVID) ARPGX2
Influenza A by PCR: NEGATIVE
Influenza B by PCR: NEGATIVE
SARS Coronavirus 2 by RT PCR: NEGATIVE

## 2020-12-07 LAB — RETICULOCYTES
Immature Retic Fract: 28.3 % — ABNORMAL HIGH (ref 2.3–15.9)
RBC.: 1.84 MIL/uL — ABNORMAL LOW (ref 4.22–5.81)
Retic Count, Absolute: 234 10*3/uL — ABNORMAL HIGH (ref 19.0–186.0)
Retic Ct Pct: 12.7 % — ABNORMAL HIGH (ref 0.4–3.1)

## 2020-12-07 LAB — PREPARE RBC (CROSSMATCH)

## 2020-12-07 MED ORDER — ONDANSETRON HCL 4 MG PO TABS: 4.0000 mg | ORAL_TABLET | Freq: Four times a day (QID) | ORAL | Status: DC | PRN

## 2020-12-07 MED ORDER — ONDANSETRON HCL 4 MG/2ML IJ SOLN
4.0000 mg | INTRAMUSCULAR | Status: DC | PRN
Start: 2020-12-07 — End: 2020-12-07

## 2020-12-07 MED ORDER — ONDANSETRON 8 MG PO TBDP
8.0000 mg | ORAL_TABLET | Freq: Once | ORAL | Status: AC
  Administered 2020-12-07: 8 mg via ORAL
  Filled 2020-12-07: qty 1

## 2020-12-07 MED ORDER — SODIUM CHLORIDE 0.9 % IV BOLUS
500.0000 mL | Freq: Once | INTRAVENOUS | Status: AC
  Administered 2020-12-07: 500 mL via INTRAVENOUS

## 2020-12-07 MED ORDER — OXYCODONE HCL ER 15 MG PO T12A
15.0000 mg | EXTENDED_RELEASE_TABLET | Freq: Two times a day (BID) | ORAL | Status: DC
  Administered 2020-12-07 – 2020-12-10 (×6): 15 mg via ORAL
  Filled 2020-12-07 (×6): qty 1

## 2020-12-07 MED ORDER — ONDANSETRON HCL 4 MG/2ML IJ SOLN: 4.0000 mg | INTRAMUSCULAR | Status: DC | PRN

## 2020-12-07 MED ORDER — NORTRIPTYLINE HCL 25 MG PO CAPS
25.0000 mg | ORAL_CAPSULE | Freq: Two times a day (BID) | ORAL | Status: DC
  Administered 2020-12-07 – 2020-12-10 (×5): 25 mg via ORAL
  Filled 2020-12-07 (×7): qty 1

## 2020-12-07 MED ORDER — SODIUM CHLORIDE 0.9% FLUSH
10.0000 mL | Freq: Two times a day (BID) | INTRAVENOUS | Status: DC
Start: 2020-12-08 — End: 2020-12-10
  Administered 2020-12-08 – 2020-12-10 (×3): 10 mL

## 2020-12-07 MED ORDER — HYDROMORPHONE HCL 1 MG/ML IJ SOLN
0.5000 mg | Freq: Once | INTRAMUSCULAR | Status: AC
  Administered 2020-12-07: 0.5 mg via INTRAVENOUS
  Filled 2020-12-07: qty 0.5

## 2020-12-07 MED ORDER — HYDROMORPHONE HCL 2 MG/ML IJ SOLN
2.0000 mg | INTRAMUSCULAR | Status: AC
  Administered 2020-12-07: 2 mg via INTRAVENOUS
  Filled 2020-12-07: qty 1

## 2020-12-07 MED ORDER — ONDANSETRON HCL 4 MG/2ML IJ SOLN
4.0000 mg | Freq: Four times a day (QID) | INTRAMUSCULAR | Status: DC | PRN
  Administered 2020-12-08: 4 mg via INTRAVENOUS
  Filled 2020-12-07: qty 2

## 2020-12-07 MED ORDER — HYDROMORPHONE 1 MG/ML IV SOLN
INTRAVENOUS | Status: DC
  Administered 2020-12-07: 30 mg via INTRAVENOUS
  Administered 2020-12-08: 5.5 mg via INTRAVENOUS
  Administered 2020-12-08: 4.5 mg via INTRAVENOUS
  Filled 2020-12-07 (×2): qty 30

## 2020-12-07 MED ORDER — POLYETHYLENE GLYCOL 3350 17 G PO PACK
17.0000 g | PACK | Freq: Two times a day (BID) | ORAL | Status: DC
  Administered 2020-12-09: 17 g via ORAL
  Filled 2020-12-07 (×3): qty 1

## 2020-12-07 MED ORDER — HYDROMORPHONE HCL 2 MG/ML IJ SOLN: 2.0000 mg | INTRAMUSCULAR | Status: DC | PRN

## 2020-12-07 MED ORDER — SODIUM CHLORIDE 0.9% FLUSH: 9.0000 mL | INTRAVENOUS | Status: DC | PRN

## 2020-12-07 MED ORDER — NALOXONE HCL 0.4 MG/ML IJ SOLN: 0.4000 mg | INTRAMUSCULAR | Status: DC | PRN

## 2020-12-07 MED ORDER — FOLIC ACID 1 MG PO TABS
1.0000 mg | ORAL_TABLET | Freq: Every day | ORAL | Status: DC
  Administered 2020-12-08 – 2020-12-10 (×3): 1 mg via ORAL
  Filled 2020-12-07 (×3): qty 1

## 2020-12-07 MED ORDER — SODIUM CHLORIDE 0.45 % IV SOLN: INTRAVENOUS | Status: DC

## 2020-12-07 MED ORDER — SODIUM BICARBONATE 650 MG PO TABS
1950.0000 mg | ORAL_TABLET | Freq: Three times a day (TID) | ORAL | Status: DC
  Administered 2020-12-07 – 2020-12-10 (×8): 1950 mg via ORAL
  Filled 2020-12-07 (×8): qty 3

## 2020-12-07 MED ORDER — ONDANSETRON HCL 4 MG/2ML IJ SOLN: 4.0000 mg | Freq: Four times a day (QID) | INTRAMUSCULAR | Status: DC | PRN

## 2020-12-07 MED ORDER — SODIUM CHLORIDE 0.9% FLUSH: 10.0000 mL | INTRAVENOUS | Status: DC | PRN

## 2020-12-07 MED ORDER — SODIUM CHLORIDE 0.9 % IV SOLN
25.0000 mg | INTRAVENOUS | Status: DC | PRN
  Filled 2020-12-07: qty 0.5

## 2020-12-07 MED ORDER — HYDROXYUREA 500 MG PO CAPS
500.0000 mg | ORAL_CAPSULE | Freq: Every day | ORAL | Status: DC
  Administered 2020-12-08 – 2020-12-10 (×3): 500 mg via ORAL
  Filled 2020-12-07 (×3): qty 1

## 2020-12-07 MED ORDER — SODIUM CHLORIDE 0.9 % IV BOLUS: 1000.0000 mL | Freq: Once | INTRAVENOUS | Status: DC

## 2020-12-07 MED ORDER — VOXELOTOR 500 MG PO TABS
1500.0000 mg | ORAL_TABLET | Freq: Every day | ORAL | Status: DC
  Administered 2020-12-08 – 2020-12-09 (×2): 1500 mg via ORAL

## 2020-12-07 MED ORDER — ACETAMINOPHEN 325 MG PO TABS: 650.0000 mg | ORAL_TABLET | Freq: Four times a day (QID) | ORAL | Status: DC | PRN

## 2020-12-07 MED ORDER — ACETAMINOPHEN 650 MG RE SUPP: 650.0000 mg | Freq: Four times a day (QID) | RECTAL | Status: DC | PRN

## 2020-12-07 MED ORDER — SODIUM ZIRCONIUM CYCLOSILICATE 10 G PO PACK: 10.0000 g | PACK | ORAL | Status: DC

## 2020-12-07 MED ORDER — HYDROMORPHONE 1 MG/ML IV SOLN: INTRAVENOUS | Status: DC

## 2020-12-07 MED ORDER — DIPHENHYDRAMINE HCL 25 MG PO CAPS: 25.0000 mg | ORAL_CAPSULE | ORAL | Status: DC | PRN

## 2020-12-07 MED ORDER — DIPHENHYDRAMINE HCL 50 MG/ML IJ SOLN
25.0000 mg | Freq: Once | INTRAMUSCULAR | Status: AC
  Administered 2020-12-07: 25 mg via INTRAVENOUS
  Filled 2020-12-07: qty 1

## 2020-12-07 NOTE — ED Triage Notes (Signed)
Patient c/o sickle cell pain "all over and reports that he has been vomiting x 2 days.
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
## 2020-12-07 NOTE — H&P (Signed)
History and Physical    [name] [name] [employer] NWG:[id number] DOB: [birth year] DOA: 12/07/2020  PCP: Quentin Angst, MD   Chief Complaint: pain all over  HPI: [name] Pick. is [name] [name] 40 y.o. male with medical history significant of HbSS sickle cell anemia, avascular necrosis of bilateral hips, bilateral knees, and right shoulder, and CKD IV who presented to [facility] with pain c/w sickle cell pain crises.  He suspects pain related to change in weather.  Notes pain started last Friday, he was aching "everywhere".  When asked to specify further, he just states "everywhere" "all over".  Difficult to get to specify any further, he'd been using his home medicines without benefit.  His pain worsened over the weekend and he called requesting to come to the day hospital for sickle cell pain and vomiting.  He was directed to the ED.  He denies fevers, notes occasional chills, notes SOB (that he relates to the pain), notes mild chest pain (he says it's not bad), notes abdominal discomfort, nausea, and vomiting.       Review of Systems: ROS   As per HPI otherwise 10 point review of systems negative.   Allergies  Allergen Reactions   Nsaids Other (See Comments)    Kidney disease   Morphine And Related Other (See Comments)    "Real bad headaches"     Past Medical History:  Diagnosis Date   Abscess of right iliac fossa 09/24/2018   required Perc Drain 09/24/2018   Arachnoid Cyst of brain bilaterally    "2 really small ones in the back of my head; inside; saw them w/MRI" (09/25/2012)   Bacterial pneumonia ~ 2012   "caught it here in the hospital" (09/25/2012)   Chronic kidney disease    "from my sickle cell" (09/25/2012)   CKD (chronic kidney disease), stage II    Colitis 04/19/2017   CT scan abd/ pelvis   GERD (gastroesophageal reflux disease)    "after I eat alot of spicey foods" (09/25/2012)   Gynecomastia, male 07/10/2012   History of blood transfusion    "always related to sickle  cell crisis" (09/25/2012)   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   Noted in noted from H[facility] at [facility]   Migraines    "take RX qd to prevent them" (09/25/2012)   Opioid dependence with withdrawal (HCC) 08/30/2012   Renal insufficiency    Sickle cell anemia (HCC)    Sickle cell crisis (HCC) 09/25/2012   Sickle cell nephropathy (HCC) 07/10/2012   Sinus tachycardia    Tachycardia with heart rate 121-140 beats per minute with ambulation 08/04/2016    Past Surgical History:  Procedure Laterality Date   ABCESS DRAINAGE     CHOLECYSTECTOMY  ~ 2012   COLONOSCOPY N/Lenell Lama 04/23/2017   Procedure: COLONOSCOPY;  Surgeon: Hilarie Fredrickson, MD;  Location: [facility];  Service: Endoscopy;  Laterality: N/Andreu Drudge;   IR FLUORO GUIDE CV LINE RIGHT  12/17/2016   IR RADIOLOGIST EVAL & MGMT  10/02/2018   IR RADIOLOGIST EVAL & MGMT  10/15/2018   IR REMOVAL TUN CV CATH W/O FL  12/21/2016   IR US GUIDE VASC ACCESS RIGHT  12/17/2016   spleenectomy       reports that he has been smoking cigarettes. He has been smoking an average of .1 packs per day. He has never used smokeless tobacco. He reports current alcohol use. He reports current drug use. Drug: Marijuana.  Family History  Problem Relation Age of Onset  Breast cancer Mother     Prior to Admission medications   Medication Sig Start Date End Date Taking? Authorizing Provider  acetaminophen (TYLENOL) 650 MG CR tablet Take 650 mg by mouth every 8 (eight) hours as needed for pain.    [provider]  Deferiprone, Twice Daily, (FERRIPROX TWICE-Hildur Bayer-DAY) 1000 MG TABS Take 1,000 mg by mouth 2 (two) times daily.    [provider]  folic acid (FOLVITE) 1 MG tablet TAKE 1 TABLET BY MOUTH EVERY DAY Patient taking differently: Take 1 mg by mouth daily. 03/22/20   Massie Maroon, FNP  hydroxyurea (HYDREA) 500 MG capsule Take 500 mg by mouth daily. 05/07/20   [provider]  LOKELMA 10 g PACK packet Take 10 g by mouth every other  day. 03/18/20   [provider]  mirtazapine (REMERON) 7.5 MG tablet Take 1 tablet (7.5 mg total) by mouth at bedtime. 08/12/20   Massie Maroon, FNP  nortriptyline (PAMELOR) 25 MG capsule Take 25 mg by mouth 2 (two) times daily. 09/07/20   [provider]  OXBRYTA 500 MG TABS tablet Take 1,500 mg by mouth daily. 03/26/20   [provider]  oxyCODONE (OXYCONTIN) 15 mg 12 hr tablet Take 1 tablet (15 mg total) by mouth every 12 (twelve) hours. 12/03/20 01/02/21  Massie Maroon, FNP  Oxycodone HCl 10 MG TABS Take 1 tablet (10 mg total) by mouth every 6 (six) hours as needed. 11/23/20   Massie Maroon, FNP  sodium bicarbonate 650 MG tablet Take 1,950 mg by mouth 3 (three) times daily. 11/07/18   [provider]    Physical Exam: Vitals:   12/07/20 1645 12/07/20 1700 12/07/20 1715 12/07/20 1730  BP: 109/76 118/81 115/80 118/86  Pulse: 85 82 85 95  Resp: 13 11 11  (!) 23  Temp:      TempSrc:      SpO2: 97% 97% 97% 98%  Weight:      Height:       Physical Exam Constitutional:      Appearance: Normal appearance.     Comments: Appears uncomfortable, but not in acute distress  Eyes:     General: Scleral icterus present.  Cardiovascular:     Rate and Rhythm: Normal rate and regular rhythm.  Pulmonary:     Effort: Pulmonary effort is normal. No respiratory distress.     Breath sounds: Normal breath sounds. No wheezing or rales.  Abdominal:     General: Abdomen is flat.     Palpations: Abdomen is soft.     Tenderness: Tenderness: mildly tender.  Musculoskeletal:        General: Normal range of motion.     Cervical back: Normal range of motion.  Skin:    General: Skin is warm and dry.  Neurological:     General: No focal deficit present.     Mental Status: He is alert and oriented to person, place, and time.  Psychiatric:        Mood and Affect: Mood normal.        Behavior: Behavior normal.    EKG sinus rhythm, similar to priors   Labs on  Admission: I have personally reviewed the patients's labs and imaging studies.  Assessment/Plan Active Problems:   Sickle cell crisis (HCC)   Sickle Cell Pain Crises Pain started Friday and has progressed, describes as "all over", but does note some SOB "due to pain" (satting normally on RA) and mild chest discomfort (not out  of proportion from previous sickle cell pain crises) CXR without acute abnormality S/p dilaudid 2 mg x2 without significant relief IVF Apap prn Will start PCA, discussed with pharmacy regarding prior dose at last hospitalization Continue oxycontin, hold oxycodone Continue hydroxyurea, obryta Of note, he's not familiar with deferiprone and doesn't seem like he's taking this  Nausea  Vomiting Suspect related to pain crises Follow with treatment Consider further w/u if not improving Macrocytic Anemia  Anemia lower than baseline Transfusing 1 unit pRBC Hemolysis suggested by elevated bili (will fractionate)  Leukocytosis Chronic, follow, afebrile  Hyperbilirubinemia Mild scleral icterus, dark urine Suspect related to hemolysis LFT's appear stable compared to baseline  CKD IV  Hyperkalemia Creatinine not too far from baseline which appears to be ~3 Continue to monitor with IVF Continue sodium bicarbonate Every other day lokelma  Chronic Headaches Nortriptyline  Admission status: Inpatient Telemetry  Certification: The appropriate patient status for this patient is INPATIENT. Inpatient status is judged to be reasonable and necessary in order to provide the required intensity of service to ensure the patient's safety. The patient's presenting symptoms, physical exam findings, and initial radiographic and laboratory data in the context of their chronic comorbidities is felt to place them at high risk for further clinical deterioration. Furthermore, it is not anticipated that the patient will be medically stable for discharge from the hospital within 2  midnights of admission.   * I certify that at the point of admission it is my clinical judgment that the patient will require inpatient hospital care spanning beyond 2 midnights from the point of admission due to high intensity of service, high risk for further deterioration and high frequency of surveillance required.Lacretia Nicks MD [facility] If 7PM-7AM, please contact night-coverage [email or web address]  12/07/2020, 6:32 PM

## 2020-12-07 NOTE — Telephone Encounter (Signed)
Patient called requesting to come to the day hospital for sickle cell pain and vomiting. Patient reports generalized achy pain rated 7/10. Reports persistent vomiting and sob. Patient reports that he feels "achy and cold" as if his "hemoglobin is low." [place], [place] notified and advised that patient go to the ED for work-up. If he is cleared in the ED then he can transition to the day hospital. Patient advised and expresses an understanding.

## 2020-12-07 NOTE — ED Notes (Signed)
Pt. Requested to have the nurse to draw blood for ultrasound IV. NURSE AWARE.

## 2020-12-07 NOTE — ED Notes (Signed)
Pt refused me to have her vitals re-assessed.

## 2020-12-07 NOTE — ED Provider Notes (Signed)
Emergency Medicine Provider Triage Evaluation Note  [name] [name]. , a 40 y.o. male  was evaluated in triage.  Pt complains of sickle cell pain crisis.  Patient states that he has pain all over his body that has been occurring for about 2 days.  Not being controlled by home regimen.  He says this is typical for his normal sickle cell crises.  He normally needs to be admitted to get under pain control.  He is on a home regimen of oxycodone 10 and 15 mg along with hydroxyurea that he has been compliant with.  He does have some associated chest pain and difficulty breathing, which he says is typical for him.  He has associated nausea and vomiting.  Vomiting has been going on for 2 days.  Review of Systems  Positive: Chest pain, nausea, vomiting Negative: Abdominal pain, headache, shortness of breath  Physical Exam  BP 110/90 (BP Location: Left Arm)   Pulse (!) 54   Temp 98.7 F (37.1 C) (Oral)   Resp 18   Ht 5\' 3"  (1.6 m)   Wt 47.4 kg   SpO2 100%   BMI 18.51 kg/m  Gen:   Awake, no distress   Resp:  Normal effort  MSK:   Moves extremities without difficulty  Other:  Radial and pedal pulses 2+ in all extremities.  Be in no acute distress.  Normal respiratory effort no signs of distress.  Abdomen soft and nontender, nondistended.  Medical Decision Making  Medically screening exam initiated at 1:30 PM.  Appropriate orders placed.  [name] [name]. was informed that the remainder of the evaluation will be completed by another provider, this initial triage assessment does not replace that evaluation, and the importance of remaining in the ED until their evaluation is complete.  Sickle cell pain crisis.  Low suspicion for acute chest syndrome or blood clot.  Ordered cardiac work-up due to chest pain, however this is typical for his normal sickle cell exasperation.  Ordered initial dose of Zofran.  Patient requested to have an ultrasound-guided IV placed due to history of for IV  access. Patient will need IV fluids and pain medication when he gets a room.   Sheila Oats 12/07/20 1333    Carmin Muskrat, MD 12/07/20 [phone]

## 2020-12-08 LAB — RETICULOCYTES
Immature Retic Fract: 26.7 % — ABNORMAL HIGH (ref 2.3–15.9)
RBC.: 1.98 MIL/uL — ABNORMAL LOW (ref 4.22–5.81)
Retic Count, Absolute: 211.7 10*3/uL — ABNORMAL HIGH (ref 19.0–186.0)
Retic Ct Pct: 10.7 % — ABNORMAL HIGH (ref 0.4–3.1)

## 2020-12-08 LAB — CBC
HCT: 20.2 % — ABNORMAL LOW (ref 39.0–52.0)
Hemoglobin: 7.2 g/dL — ABNORMAL LOW (ref 13.0–17.0)
MCH: 36.5 pg — ABNORMAL HIGH (ref 26.0–34.0)
MCHC: 35.6 g/dL (ref 30.0–36.0)
MCV: 102.5 fL — ABNORMAL HIGH (ref 80.0–100.0)
Platelets: 205 10*3/uL (ref 150–400)
RBC: 1.97 MIL/uL — ABNORMAL LOW (ref 4.22–5.81)
RDW: 26.4 % — ABNORMAL HIGH (ref 11.5–15.5)
WBC: 20.2 10*3/uL — ABNORMAL HIGH (ref 4.0–10.5)
nRBC: 0.1 % (ref 0.0–0.2)

## 2020-12-08 LAB — COMPREHENSIVE METABOLIC PANEL
ALT: 36 U/L (ref 0–44)
AST: 51 U/L — ABNORMAL HIGH (ref 15–41)
Albumin: 3 g/dL — ABNORMAL LOW (ref 3.5–5.0)
Alkaline Phosphatase: 231 U/L — ABNORMAL HIGH (ref 38–126)
Anion gap: 8 (ref 5–15)
BUN: 43 mg/dL — ABNORMAL HIGH (ref 6–20)
CO2: 19 mmol/L — ABNORMAL LOW (ref 22–32)
Calcium: 7.8 mg/dL — ABNORMAL LOW (ref 8.9–10.3)
Chloride: 104 mmol/L (ref 98–111)
Creatinine, Ser: 2.62 mg/dL — ABNORMAL HIGH (ref 0.61–1.24)
GFR, Estimated: 31 mL/min — ABNORMAL LOW (ref >60)
Glucose, Bld: 91 mg/dL (ref 70–99)
Potassium: 4.7 mmol/L (ref 3.5–5.1)
Sodium: 131 mmol/L — ABNORMAL LOW (ref 135–145)
Total Bilirubin: 1.9 mg/dL — ABNORMAL HIGH (ref 0.3–1.2)
Total Protein: 6.5 g/dL (ref 6.5–8.1)

## 2020-12-08 MED ORDER — OXYCODONE HCL 5 MG PO TABS
10.0000 mg | ORAL_TABLET | Freq: Four times a day (QID) | ORAL | Status: DC | PRN
  Administered 2020-12-08 – 2020-12-09 (×4): 10 mg via ORAL
  Filled 2020-12-08 (×4): qty 2

## 2020-12-08 MED ORDER — DEFERIPRONE (TWICE DAILY) 1000 MG PO TABS
1000.0000 mg | ORAL_TABLET | Freq: Two times a day (BID) | ORAL | Status: DC
  Administered 2020-12-08 – 2020-12-09 (×3): 1000 mg via ORAL

## 2020-12-08 MED ORDER — HYDROMORPHONE 1 MG/ML IV SOLN
INTRAVENOUS | Status: DC
  Administered 2020-12-08: 2 mg via INTRAVENOUS
  Administered 2020-12-08: 4 mg via INTRAVENOUS
  Administered 2020-12-08: 3 mg via INTRAVENOUS
  Administered 2020-12-09: 30 mg via INTRAVENOUS
  Filled 2020-12-08: qty 30

## 2020-12-08 MED ORDER — CALCITRIOL 0.25 MCG PO CAPS
0.2500 ug | ORAL_CAPSULE | Freq: Every day | ORAL | Status: DC
  Administered 2020-12-08 – 2020-12-10 (×3): 0.25 ug via ORAL
  Filled 2020-12-08 (×3): qty 1

## 2020-12-08 MED ORDER — SODIUM ZIRCONIUM CYCLOSILICATE 10 G PO PACK
10.0000 g | PACK | ORAL | Status: DC
  Administered 2020-12-08: 10 g via ORAL
  Filled 2020-12-08 (×2): qty 1

## 2020-12-08 NOTE — Progress Notes (Signed)
Subjective: [name] [name] is a 40 year old male with a medical history significant for sickle cell disease, stage IV chronic kidney disease, chronic pain syndrome, opiate dependence and tolerance, history of polysubstance abuse, and history of anemia of chronic disease was admitted for sickle cell pain crisis. Today, patient states that pain has improved overnight.  He rates his pain as 8/10 primarily to lower extremities.  He denies any headache, chest pain, shortness of breath, urinary symptoms, nausea, vomiting, or diarrhea.  Patient endorses some dizziness that he has had periodically over the past week, but says that it is much improved.  Objective:  Vital signs in last 24 hours:  Vitals:   12/08/20 0400 12/08/20 0404 12/08/20 0830 12/08/20 0837  BP: 133/72  (!) 150/66   Pulse: 83  78   Resp: 18 16 12 12   Temp: 98.2 F (36.8 C)  98 F (36.7 C)   TempSrc: Oral  Oral   SpO2: 98% 98% 95%   Weight:      Height:        Intake/Output from previous day:   Intake/Output Summary (Last 24 hours) at 12/08/2020 1244 Last data filed at 12/08/2020 0840 Gross per 24 hour  Intake 889.17 ml  Output 1300 ml  Net -410.83 ml    Physical Exam: General: Alert, awake, oriented x3, in no acute distress.  HEENT: [place]/AT PEERL, EOMI Neck: Trachea midline,  no masses, no thyromegal,y no JVD, no carotid bruit OROPHARYNX:  Moist, No exudate/ erythema/lesions.  Heart: Regular rate and rhythm, without murmurs, rubs, gallops, PMI non-displaced, no heaves or thrills on palpation.  Lungs: Clear to auscultation, no wheezing or rhonchi noted. No increased vocal fremitus resonant to percussion  Abdomen: Soft, nontender, nondistended, positive bowel sounds, no masses no hepatosplenomegaly noted..  Neuro: No focal neurological deficits noted cranial nerves II through XII grossly intact. DTRs 2+ bilaterally upper and lower extremities. Strength 5 out of 5 in bilateral upper and lower  extremities. Musculoskeletal: No warm swelling or erythema around joints, no spinal tenderness noted. Psychiatric: Patient alert and oriented x3, good insight and cognition, good recent to remote recall. Lymph node survey: No cervical axillary or inguinal lymphadenopathy noted.  Lab Results:  Basic Metabolic Panel:    Component Value Date/Time   NA 131 (L) 12/08/2020 0521   NA 135 07/14/2020 1005   K 4.7 12/08/2020 0521   CL 104 12/08/2020 0521   CO2 19 (L) 12/08/2020 0521   BUN 43 (H) 12/08/2020 0521   BUN 35 (H) 07/14/2020 1005   CREATININE 2.62 (H) 12/08/2020 0521   CREATININE 1.45 (H) 05/12/2014 1430   GLUCOSE 91 12/08/2020 0521   CALCIUM 7.8 (L) 12/08/2020 0521   CBC:    Component Value Date/Time   WBC 20.2 (H) 12/08/2020 0521   HGB 7.2 (L) 12/08/2020 0521   HGB 8.6 (L) 07/14/2020 1005   HCT 20.2 (L) 12/08/2020 0521   HCT 23.6 (L) 07/14/2020 1005   PLT 205 12/08/2020 0521   PLT 339 07/14/2020 1005   MCV 102.5 (H) 12/08/2020 0521   MCV 117 (H) 07/14/2020 1005   NEUTROABS 13.1 (H) 12/07/2020 1539   NEUTROABS 7.0 07/14/2020 1005   LYMPHSABS 4.7 (H) 12/07/2020 1539   LYMPHSABS 6.2 (H) 07/14/2020 1005   MONOABS 1.9 (H) 12/07/2020 1539   EOSABS 0.0 12/07/2020 1539   EOSABS 0.3 07/14/2020 1005   BASOSABS 0.1 12/07/2020 1539   BASOSABS 0.0 07/14/2020 1005    Recent Results (from the past 240 hour(s))  Resp Panel  by RT-PCR (Flu A&B, Covid) Nasopharyngeal Swab     Status: None   Collection Time: 12/07/20  5:30 PM   Specimen: Nasopharyngeal Swab; Nasopharyngeal(NP) swabs in vial transport medium  Result Value Ref Range Status   SARS Coronavirus 2 by RT PCR NEGATIVE NEGATIVE Final    Comment: (NOTE) SARS-CoV-2 target nucleic acids are NOT DETECTED.  The SARS-CoV-2 RNA is generally detectable in upper respiratory specimens during the acute phase of infection. The lowest concentration of SARS-CoV-2 viral copies this assay can detect is 138 copies/mL. A negative result  does not preclude SARS-Cov-2 infection and should not be used as the sole basis for treatment or other patient management decisions. A negative result may occur with  improper specimen collection/handling, submission of specimen other than nasopharyngeal swab, presence of viral mutation(s) within the areas targeted by this assay, and inadequate number of viral copies(<138 copies/mL). A negative result must be combined with clinical observations, patient history, and epidemiological information. The expected result is Negative.  Fact Sheet for Patients:  EntrepreneurPulse.com.au  Fact Sheet for Healthcare Providers:  IncredibleEmployment.be  This test is no t yet approved or cleared by the [place] FDA and  has been authorized for detection and/or diagnosis of SARS-CoV-2 by FDA under an Emergency Use Authorization (EUA). This EUA will remain  in effect (meaning this test can be used) for the duration of the COVID-19 declaration under Section 564(b)(1) of the Act, 21 U.S.C.section 360bbb-3(b)(1), unless the authorization is terminated  or revoked sooner.       Influenza A by PCR NEGATIVE NEGATIVE Final   Influenza B by PCR NEGATIVE NEGATIVE Final    Comment: (NOTE) The Xpert Xpress SARS-CoV-2/FLU/RSV plus assay is intended as an aid in the diagnosis of influenza from Nasopharyngeal swab specimens and should not be used as a sole basis for treatment. Nasal washings and aspirates are unacceptable for Xpert Xpress SARS-CoV-2/FLU/RSV testing.  Fact Sheet for Patients: EntrepreneurPulse.com.au  Fact Sheet for Healthcare Providers: IncredibleEmployment.be  This test is not yet approved or cleared by the [place] FDA and has been authorized for detection and/or diagnosis of SARS-CoV-2 by FDA under an Emergency Use Authorization (EUA). This EUA will remain in effect (meaning this test can be used) for  the duration of the COVID-19 declaration under Section 564(b)(1) of the Act, 21 U.S.C. section 360bbb-3(b)(1), unless the authorization is terminated or revoked.  Performed at [facility], [place] [address] [place], [place] [address]     Studies/Results: DG Chest 2 View  Result Date: 12/07/2020 CLINICAL DATA:  Chest pain, history of sickle cell EXAM: CHEST - 2 VIEW COMPARISON:  Chest radiograph 10/22/2020 FINDINGS: The cardiomediastinal silhouette is normal. There is no focal consolidation or pulmonary edema. There is no pleural effusion or pneumothorax. Avascular necrosis of the right humeral head is again seen. There is no acute osseous abnormality. Cholecystectomy clips are noted. IMPRESSION: No radiographic evidence of acute cardiopulmonary process. Electronically Signed   By: Valetta Mole M.D.   On: 12/07/2020 13:59    Medications: Scheduled Meds:  calcitRIOL  0.25 mcg Oral Daily   Deferiprone (Twice Daily)  1,000 mg Oral BID   folic acid  1 mg Oral Daily   HYDROmorphone   Intravenous Q4H   hydroxyurea  500 mg Oral Daily   nortriptyline  25 mg Oral BID   oxyCODONE  15 mg Oral Q12H   polyethylene glycol  17 g Oral BID   sodium bicarbonate  1,950 mg Oral TID  sodium chloride flush  10-40 mL Intracatheter Q12H   sodium zirconium cyclosilicate  10 g Oral QODAY   voxelotor  1,500 mg Oral Daily   Continuous Infusions:  diphenhydrAMINE     PRN Meds:.acetaminophen **OR** acetaminophen, diphenhydrAMINE **OR** diphenhydrAMINE, naloxone **AND** sodium chloride flush, ondansetron (ZOFRAN) IV, ondansetron **OR** ondansetron (ZOFRAN) IV, oxyCODONE, sodium chloride flush  Consultants: None  Procedures: None  Antibiotics: None  Assessment/Plan: Principal Problem:   Sickle cell crisis (HCC) Active Problems:   CKD (chronic kidney disease), stage IV (HCC)   Acute kidney injury superimposed on chronic kidney disease (HCC)   Chronic pain syndrome  Sickle cell  disease with pain crisis: Pain intensity improved on today.  We will continue IV Dilaudid PCA, decrease settings. Start oxycodone 10 mg every 6 hours as needed for severe breakthrough pain Monitor vital signs very closely, reevaluate pain scale regularly, and supplemental oxygen as needed.  Nausea and vomiting: Resolved.  Upon admission, patient had increased nausea and vomiting.  Improved, patient has not vomited throughout admission.  Nausea is controlled.  He is tolerating diet well.  Anemia of chronic disease: Patient is s/p 1 unit PRBCs.  Hemoglobin improving.  Today, hemoglobin is 7.4 g/dL which is slightly below his baseline.  No further transfusion warranted at this time.  Patient is on Oxbryta at home, did not bring medication during admission.  Advised to have his family bring in [place].  We will continue hydroxyurea and folic acid.  Leukocytosis: Stable.  Appears to be chronic.  He is afebrile without any signs of infection.  Continue to follow closely.  Labs in AM.  Hyperbilirubinemia: Stable.  Secondary to hemolytic crisis.  LFTs stable.  Continue to follow closely.  CKD 4 hyperkalemia: Creatinine is stable and trending towards patient's baseline.  Improved today following IVF, will continue.  Patient is followed by nephrology as an outpatient.  Hyperkalemia: Continue Lokelma every other day   Code Status: Full Code Family Communication: N/A Disposition Plan: Not yet ready for discharge   [place], MSN, FNP-C Patient [place] [address] [place], [place] [address] [phone]  If 7PM-7AM, please contact night-coverage.  12/08/2020, 12:44 PM  LOS: 1 day

## 2020-12-09 DIAGNOSIS — D5700 Hb-SS disease with crisis, unspecified: Principal | ICD-10-CM

## 2020-12-09 LAB — BPAM RBC
Blood Product Expiration Date: 202211182359
ISSUE DATE / TIME: 202210260110
Unit Type and Rh: 5100

## 2020-12-09 LAB — TYPE AND SCREEN
ABO/RH(D): O POS
Antibody Screen: NEGATIVE
Unit division: 0

## 2020-12-09 LAB — BASIC METABOLIC PANEL
Anion gap: 8 (ref 5–15)
BUN: 34 mg/dL — ABNORMAL HIGH (ref 6–20)
CO2: 24 mmol/L (ref 22–32)
Calcium: 8.6 mg/dL — ABNORMAL LOW (ref 8.9–10.3)
Chloride: 105 mmol/L (ref 98–111)
Creatinine, Ser: 2.44 mg/dL — ABNORMAL HIGH (ref 0.61–1.24)
GFR, Estimated: 34 mL/min — ABNORMAL LOW (ref >60)
Glucose, Bld: 99 mg/dL (ref 70–99)
Potassium: 4.9 mmol/L (ref 3.5–5.1)
Sodium: 137 mmol/L (ref 135–145)

## 2020-12-09 LAB — CBC
HCT: 20.6 % — ABNORMAL LOW (ref 39.0–52.0)
Hemoglobin: 7.4 g/dL — ABNORMAL LOW (ref 13.0–17.0)
MCH: 36.3 pg — ABNORMAL HIGH (ref 26.0–34.0)
MCHC: 35.9 g/dL (ref 30.0–36.0)
MCV: 101 fL — ABNORMAL HIGH (ref 80.0–100.0)
Platelets: 231 10*3/uL (ref 150–400)
RBC: 2.04 MIL/uL — ABNORMAL LOW (ref 4.22–5.81)
RDW: 27.1 % — ABNORMAL HIGH (ref 11.5–15.5)
WBC: 17.3 10*3/uL — ABNORMAL HIGH (ref 4.0–10.5)
nRBC: 0.2 % (ref 0.0–0.2)

## 2020-12-09 MED ORDER — HYDROMORPHONE 1 MG/ML IV SOLN
INTRAVENOUS | Status: DC
  Administered 2020-12-09: 2.5 mg via INTRAVENOUS
  Administered 2020-12-09: 4 mg via INTRAVENOUS
  Administered 2020-12-10: 0 mg via INTRAVENOUS
  Administered 2020-12-10: 5.5 mg via INTRAVENOUS
  Administered 2020-12-10: 1 mg via INTRAVENOUS
  Administered 2020-12-10: 4.5 mg via INTRAVENOUS

## 2020-12-09 NOTE — Progress Notes (Signed)
Subjective: [name] [name] is a 40 year old male with a medical history significant for sickle cell disease, stage IV chronic kidney disease, chronic pain syndrome, opiate dependence and tolerance, history of polysubstance abuse, and history of anemia of chronic disease was admitted for sickle cell pain crisis.  Today, patient's hemoglobin has improved to 7.4 g/dL.  He denies any shortness of breath, chest pain, dizziness, nausea, vomiting, or diarrhea.  He currently rates the low back and lower extremity pain at 6/10, which is somewhat improved.    Objective:  Vital signs in last 24 hours:  Vitals:   12/09/20 0451 12/09/20 0454 12/09/20 0511 12/09/20 0630  BP: 138/72     Pulse: 78     Resp: 12  16 16   Temp:      TempSrc:      SpO2: 100%  96% 96%  Weight:  48.8 kg    Height:        Intake/Output from previous day:   Intake/Output Summary (Last 24 hours) at 12/09/2020 0915 Last data filed at 12/08/2020 2238 Gross per 24 hour  Intake 270 ml  Output 900 ml  Net -630 ml    Physical Exam: General: Alert, awake, oriented x3, in no acute distress.  HEENT: [place]/AT PEERL, EOMI Neck: Trachea midline,  no masses, no thyromegal,y no JVD, no carotid bruit OROPHARYNX:  Moist, No exudate/ erythema/lesions.  Heart: Regular rate and rhythm, murmur present, rubs, gallops, PMI non-displaced, no heaves or thrills on palpation.  Lungs: Clear to auscultation, no wheezing or rhonchi noted. No increased vocal fremitus resonant to percussion  Abdomen: Soft, nontender, nondistended, positive bowel sounds, no masses no hepatosplenomegaly noted..  Neuro: No focal neurological deficits noted cranial nerves II through XII grossly intact. DTRs 2+ bilaterally upper and lower extremities. Strength 5 out of 5 in bilateral upper and lower extremities. Musculoskeletal: No warm swelling or erythema around joints, no spinal tenderness noted. Psychiatric: Patient alert and oriented x3, good insight and cognition,  good recent to remote recall. Lymph node survey: No cervical axillary or inguinal lymphadenopathy noted.  Lab Results:  Basic Metabolic Panel:    Component Value Date/Time   NA 137 12/09/2020 0450   NA 135 07/14/2020 1005   K 4.9 12/09/2020 0450   CL 105 12/09/2020 0450   CO2 24 12/09/2020 0450   BUN 34 (H) 12/09/2020 0450   BUN 35 (H) 07/14/2020 1005   CREATININE 2.44 (H) 12/09/2020 0450   CREATININE 1.45 (H) 05/12/2014 1430   GLUCOSE 99 12/09/2020 0450   CALCIUM 8.6 (L) 12/09/2020 0450   CBC:    Component Value Date/Time   WBC 17.3 (H) 12/09/2020 0450   HGB 7.4 (L) 12/09/2020 0450   HGB 8.6 (L) 07/14/2020 1005   HCT 20.6 (L) 12/09/2020 0450   HCT 23.6 (L) 07/14/2020 1005   PLT 231 12/09/2020 0450   PLT 339 07/14/2020 1005   MCV 101.0 (H) 12/09/2020 0450   MCV 117 (H) 07/14/2020 1005   NEUTROABS 13.1 (H) 12/07/2020 1539   NEUTROABS 7.0 07/14/2020 1005   LYMPHSABS 4.7 (H) 12/07/2020 1539   LYMPHSABS 6.2 (H) 07/14/2020 1005   MONOABS 1.9 (H) 12/07/2020 1539   EOSABS 0.0 12/07/2020 1539   EOSABS 0.3 07/14/2020 1005   BASOSABS 0.1 12/07/2020 1539   BASOSABS 0.0 07/14/2020 1005    Recent Results (from the past 240 hour(s))  Resp Panel by RT-PCR (Flu A&B, Covid) Nasopharyngeal Swab     Status: None   Collection Time: 12/07/20  5:30  PM   Specimen: Nasopharyngeal Swab; Nasopharyngeal(NP) swabs in vial transport medium  Result Value Ref Range Status   SARS Coronavirus 2 by RT PCR NEGATIVE NEGATIVE Final    Comment: (NOTE) SARS-CoV-2 target nucleic acids are NOT DETECTED.  The SARS-CoV-2 RNA is generally detectable in upper respiratory specimens during the acute phase of infection. The lowest concentration of SARS-CoV-2 viral copies this assay can detect is 138 copies/mL. A negative result does not preclude SARS-Cov-2 infection and should not be used as the sole basis for treatment or other patient management decisions. A negative result may occur with  improper  specimen collection/handling, submission of specimen other than nasopharyngeal swab, presence of viral mutation(s) within the areas targeted by this assay, and inadequate number of viral copies(<138 copies/mL). A negative result must be combined with clinical observations, patient history, and epidemiological information. The expected result is Negative.  Fact Sheet for Patients:  EntrepreneurPulse.com.au  Fact Sheet for Healthcare Providers:  IncredibleEmployment.be  This test is no t yet approved or cleared by the [place] FDA and  has been authorized for detection and/or diagnosis of SARS-CoV-2 by FDA under an Emergency Use Authorization (EUA). This EUA will remain  in effect (meaning this test can be used) for the duration of the COVID-19 declaration under Section 564(b)(1) of the Act, 21 U.S.C.section 360bbb-3(b)(1), unless the authorization is terminated  or revoked sooner.       Influenza A by PCR NEGATIVE NEGATIVE Final   Influenza B by PCR NEGATIVE NEGATIVE Final    Comment: (NOTE) The Xpert Xpress SARS-CoV-2/FLU/RSV plus assay is intended as an aid in the diagnosis of influenza from Nasopharyngeal swab specimens and should not be used as a sole basis for treatment. Nasal washings and aspirates are unacceptable for Xpert Xpress SARS-CoV-2/FLU/RSV testing.  Fact Sheet for Patients: EntrepreneurPulse.com.au  Fact Sheet for Healthcare Providers: IncredibleEmployment.be  This test is not yet approved or cleared by the [place] FDA and has been authorized for detection and/or diagnosis of SARS-CoV-2 by FDA under an Emergency Use Authorization (EUA). This EUA will remain in effect (meaning this test can be used) for the duration of the COVID-19 declaration under Section 564(b)(1) of the Act, 21 U.S.C. section 360bbb-3(b)(1), unless the authorization is terminated or revoked.  Performed at  [facility], [place] [address] [place], [place] [address]     Studies/Results: DG Chest 2 View  Result Date: 12/07/2020 CLINICAL DATA:  Chest pain, history of sickle cell EXAM: CHEST - 2 VIEW COMPARISON:  Chest radiograph 10/22/2020 FINDINGS: The cardiomediastinal silhouette is normal. There is no focal consolidation or pulmonary edema. There is no pleural effusion or pneumothorax. Avascular necrosis of the right humeral head is again seen. There is no acute osseous abnormality. Cholecystectomy clips are noted. IMPRESSION: No radiographic evidence of acute cardiopulmonary process. Electronically Signed   By: Valetta Mole M.D.   On: 12/07/2020 13:59    Medications: Scheduled Meds:  calcitRIOL  0.25 mcg Oral Daily   Deferiprone (Twice Daily)  1,000 mg Oral BID   folic acid  1 mg Oral Daily   HYDROmorphone   Intravenous Q4H   hydroxyurea  500 mg Oral Daily   nortriptyline  25 mg Oral BID   oxyCODONE  15 mg Oral Q12H   polyethylene glycol  17 g Oral BID   sodium bicarbonate  1,950 mg Oral TID   sodium chloride flush  10-40 mL Intracatheter Q12H   sodium zirconium cyclosilicate  10 g Oral QODAY  voxelotor  1,500 mg Oral Daily   Continuous Infusions:  diphenhydrAMINE     PRN Meds:.acetaminophen **OR** acetaminophen, diphenhydrAMINE **OR** diphenhydrAMINE, naloxone **AND** sodium chloride flush, ondansetron (ZOFRAN) IV, ondansetron **OR** ondansetron (ZOFRAN) IV, oxyCODONE, sodium chloride flush  Consultants: None  Procedures: None  Antibiotics: None  Assessment/Plan: Principal Problem:   Sickle cell crisis (HCC) Active Problems:   CKD (chronic kidney disease), stage IV (HCC)   Acute kidney injury superimposed on chronic kidney disease (HCC)   Chronic pain syndrome  Sickle cell disease with pain crisis: Weaning IV Dilaudid PCA Continue oxycodone 10 mg every 6 hours as needed for severe breakthrough pain. Monitor vital signs very closely, reevaluate pain  scale regularly, and supplemental oxygen as needed Nausea and vomiting: Resolved.  Patient tolerating diet well.  Antiemetics as needed. Anemia of chronic disease: Today, patient's hemoglobin is 7.4.  No additional transfusion warranted at this time.  Continue to follow closely.  Leukocytosis: Stable.  Appears to be chronic.  Patient remains afebrile without signs of infection.  Continue to follow closely.  Hyperbilirubinemia: Stable.  Secondary to crisis.  LFTs have remained stable.  Continue to follow closely.  CKD stage IV: Stable.  Consistent with baseline.  Continue to monitor closely.  Hyperkalemia: Resolved.  Continue Lokelma every other day  Code Status: Full Code Family Communication: N/A Disposition Plan: Not yet ready for discharge.  Discharge planned for 12/10/2020   [name]  APRN, MSN, FNP-C Patient [place], [place] [address] [phone]  If 5PM-7AM, please contact night-coverage.  12/09/2020, 9:15 AM  LOS: 2 days

## 2020-12-10 ENCOUNTER — Other Ambulatory Visit (HOSPITAL_COMMUNITY): Payer: Self-pay

## 2020-12-10 DIAGNOSIS — N189 Chronic kidney disease, unspecified: Secondary | ICD-10-CM

## 2020-12-10 DIAGNOSIS — N179 Acute kidney failure, unspecified: Secondary | ICD-10-CM | POA: Diagnosis not present

## 2020-12-10 DIAGNOSIS — N184 Chronic kidney disease, stage 4 (severe): Secondary | ICD-10-CM

## 2020-12-10 DIAGNOSIS — G894 Chronic pain syndrome: Secondary | ICD-10-CM | POA: Diagnosis not present

## 2020-12-10 DIAGNOSIS — D5700 Hb-SS disease with crisis, unspecified: Secondary | ICD-10-CM | POA: Diagnosis not present

## 2020-12-10 DIAGNOSIS — D649 Anemia, unspecified: Secondary | ICD-10-CM

## 2020-12-10 LAB — CBC
HCT: 24.3 % — ABNORMAL LOW (ref 39.0–52.0)
Hemoglobin: 8.7 g/dL — ABNORMAL LOW (ref 13.0–17.0)
MCH: 35.8 pg — ABNORMAL HIGH (ref 26.0–34.0)
MCHC: 35.8 g/dL (ref 30.0–36.0)
MCV: 100 fL (ref 80.0–100.0)
Platelets: ADEQUATE 10*3/uL (ref 150–400)
RBC: 2.43 MIL/uL — ABNORMAL LOW (ref 4.22–5.81)
WBC: 14.9 10*3/uL — ABNORMAL HIGH (ref 4.0–10.5)
nRBC: 0.3 % — ABNORMAL HIGH (ref 0.0–0.2)

## 2020-12-10 MED ORDER — MELATONIN 3 MG PO TABS: 3.0000 mg | ORAL_TABLET | Freq: Every day | ORAL | 2 refills | Status: DC

## 2020-12-10 NOTE — Care Management Important Message (Signed)
Medicare IM printed for W/L Social Work to give to the patient. 

## 2020-12-10 NOTE — Plan of Care (Signed)

## 2020-12-10 NOTE — Discharge Summary (Signed)
Physician Discharge Summary  [name] [name]. ELF:[id number] DOB: 1980-04-22 DOA: 12/07/2020  PCP: [name], MD  Admit date: 12/07/2020  Discharge date: 12/10/2020  Discharge Diagnoses:  Principal Problem:   Sickle cell crisis ([place]) Active Problems:   CKD (chronic kidney disease), stage IV (HCC)   Acute kidney injury superimposed on chronic kidney disease ([place])   Chronic pain syndrome   Discharge Condition: Stable  Disposition:   Follow-up Information     [name] Dew, FNP. Schedule an appointment as soon as possible for a visit in 1 week(s).   Specialty: Family Medicine Contact information: [place]. [place] [address] [phone]                Pt is discharged home in good condition and is to follow up with [name], MD this week to have labs evaluated. [name] A Pedone Jr. is instructed to increase activity slowly and balance with rest for the next few days, and use prescribed medication to complete treatment of pain  Diet: Regular Wt Readings from Last 3 Encounters:  12/10/20 49 kg  10/23/20 51.4 kg  09/30/20 55.3 kg    History of present illness:  [name] [name]. is a 40 y.o. male with medical history significant of HbSS sickle cell anemia, avascular necrosis of bilateral hips, bilateral knees, and right shoulder, and CKD IV who presented to [facility] with pain c/w sickle cell pain crises.   He suspects pain related to change in weather.  Notes pain started last Friday, he was aching "everywhere".  When asked to specify further, he just states "everywhere" "all over".  Difficult to get to specify any further, he'd been using his home medicines without benefit.  His pain worsened over the weekend and he called requesting to come to the day hospital for sickle cell pain and vomiting.  He was directed to the ED.  He denies fevers, notes occasional chills, notes SOB (that he relates to the pain), notes mild  chest pain (he says it's not bad), notes abdominal discomfort, nausea, and vomiting.      Hospital Course:  Patient was admitted for symptomatic anemia in the setting of sickle cell pain crisis and managed appropriately with IVF, IV Dilaudid via PCA, as well as other adjunct therapies per sickle cell pain management protocols.  IV Toradol is contraindicated because of CKD.  He was transfused with 1 unit of packed red blood cell with great improvement.  Patient's symptoms slowly improved, his extreme fatigue improved, pain returned to baseline, hemoglobin concentration improved to 8.7 at the time of discharge.  As at today, patient is tolerating p.o. intake with no restrictions, ambulating well with no significant pain.  He requested to be discharged home as his pain is now manageable at home.  He has all his home medications including pain medications. Patient was therefore discharged home today in a hemodynamically stable condition.   Richie will follow-up with PCP within 1 week of this discharge. Abdulahi was counseled extensively about nonpharmacologic means of pain management, patient verbalized understanding and was appreciative of  the care received during this admission.   We discussed the need for good hydration, monitoring of hydration status, avoidance of heat, cold, stress, and infection triggers. We discussed the need to be adherent with taking Hydrea and other home medications. Patient was reminded of the need to seek medical attention immediately if any symptom of bleeding, anemia, or infection occurs.  Discharge Exam: Vitals:  12/10/20 0734 12/10/20 1116  BP:    Pulse:    Resp: 13 12  Temp:    SpO2: 95% 96%   Vitals:   12/10/20 0623 12/10/20 0648 12/10/20 0734 12/10/20 1116  BP:      Pulse:      Resp:  15 13 12   Temp:      TempSrc:      SpO2:  98% 95% 96%  Weight: 49 kg     Height:       General appearance : Awake, alert, not in any distress. Speech Clear. Not toxic  looking HEENT: Atraumatic and Normocephalic, pupils equally reactive to light and accomodation Neck: Supple, no JVD. No cervical lymphadenopathy.  Chest: Good air entry bilaterally, no added sounds  CVS: S1 S2 regular, no murmurs.  Abdomen: Bowel sounds present, Non tender and not distended with no gaurding, rigidity or rebound. Extremities: B/L Lower Ext shows no edema, both legs are warm to touch Neurology: Awake alert, and oriented X 3, CN II-XII intact, Non focal Skin: No Rash  Discharge Instructions  Discharge Instructions     Diet - low sodium heart healthy   Complete by: As directed    Increase activity slowly   Complete by: As directed       Allergies as of 12/10/2020       Reactions   Nsaids Other (See Comments)   Kidney disease   Morphine And Related Other (See Comments)   "Real bad headaches"         Medication List     STOP taking these medications    mirtazapine 7.5 MG tablet Commonly known as: REMERON       TAKE these medications    acetaminophen 650 MG CR tablet Commonly known as: TYLENOL Take 650 mg by mouth every 8 (eight) hours as needed for pain.   calcitRIOL 0.25 MCG capsule Commonly known as: ROCALTROL Take 0.25 mcg by mouth daily.   Ferriprox Twice-A-Day 1000 MG Tabs Generic drug: Deferiprone (Twice Daily) Take 1,000 mg by mouth 2 (two) times daily.   folic acid 1 MG tablet Commonly known as: FOLVITE TAKE 1 TABLET BY MOUTH EVERY DAY   hydroxyurea 500 MG capsule Commonly known as: HYDREA Take 500 mg by mouth daily.   Lokelma 10 g Pack packet Generic drug: sodium zirconium cyclosilicate Take 10 g by mouth every other day.   melatonin 3 MG Tabs tablet Take 1 tablet (3 mg total) by mouth at bedtime.   nortriptyline 25 MG capsule Commonly known as: PAMELOR Take 25 mg by mouth 2 (two) times daily.   Oxbryta 500 MG Tabs tablet Generic drug: voxelotor Take 1,500 mg by mouth daily.   Oxycodone HCl 10 MG Tabs Take 1 tablet  (10 mg total) by mouth every 6 (six) hours as needed.   oxyCODONE 15 mg 12 hr tablet Commonly known as: OxyCONTIN Take 1 tablet (15 mg total) by mouth every 12 (twelve) hours.   sodium bicarbonate 650 MG tablet Take 1,950 mg by mouth 3 (three) times daily.        The results of significant diagnostics from this hospitalization (including imaging, microbiology, ancillary and laboratory) are listed below for reference.    Significant Diagnostic Studies: DG Chest 2 View  Result Date: 12/07/2020 CLINICAL DATA:  Chest pain, history of sickle cell EXAM: CHEST - 2 VIEW COMPARISON:  Chest radiograph 10/22/2020 FINDINGS: The cardiomediastinal silhouette is normal. There is no focal consolidation or pulmonary edema. There is no pleural  effusion or pneumothorax. Avascular necrosis of the right humeral head is again seen. There is no acute osseous abnormality. Cholecystectomy clips are noted. IMPRESSION: No radiographic evidence of acute cardiopulmonary process. Electronically Signed   By: Valetta Mole M.D.   On: 12/07/2020 13:59    Microbiology: Recent Results (from the past 240 hour(s))  Resp Panel by RT-PCR (Flu A&B, Covid) Nasopharyngeal Swab     Status: None   Collection Time: 12/07/20  5:30 PM   Specimen: Nasopharyngeal Swab; Nasopharyngeal(NP) swabs in vial transport medium  Result Value Ref Range Status   SARS Coronavirus 2 by RT PCR NEGATIVE NEGATIVE Final    Comment: (NOTE) SARS-CoV-2 target nucleic acids are NOT DETECTED.  The SARS-CoV-2 RNA is generally detectable in upper respiratory specimens during the acute phase of infection. The lowest concentration of SARS-CoV-2 viral copies this assay can detect is 138 copies/mL. A negative result does not preclude SARS-Cov-2 infection and should not be used as the sole basis for treatment or other patient management decisions. A negative result may occur with  improper specimen collection/handling, submission of specimen other than  nasopharyngeal swab, presence of viral mutation(s) within the areas targeted by this assay, and inadequate number of viral copies(<138 copies/mL). A negative result must be combined with clinical observations, patient history, and epidemiological information. The expected result is Negative.  Fact Sheet for Patients:  EntrepreneurPulse.com.au  Fact Sheet for Healthcare Providers:  IncredibleEmployment.be  This test is no t yet approved or cleared by the [place] FDA and  has been authorized for detection and/or diagnosis of SARS-CoV-2 by FDA under an Emergency Use Authorization (EUA). This EUA will remain  in effect (meaning this test can be used) for the duration of the COVID-19 declaration under Section 564(b)(1) of the Act, 21 U.S.C.section 360bbb-3(b)(1), unless the authorization is terminated  or revoked sooner.       Influenza A by PCR NEGATIVE NEGATIVE Final   Influenza B by PCR NEGATIVE NEGATIVE Final    Comment: (NOTE) The Xpert Xpress SARS-CoV-2/FLU/RSV plus assay is intended as an aid in the diagnosis of influenza from Nasopharyngeal swab specimens and should not be used as a sole basis for treatment. Nasal washings and aspirates are unacceptable for Xpert Xpress SARS-CoV-2/FLU/RSV testing.  Fact Sheet for Patients: EntrepreneurPulse.com.au  Fact Sheet for Healthcare Providers: IncredibleEmployment.be  This test is not yet approved or cleared by the [place] FDA and has been authorized for detection and/or diagnosis of SARS-CoV-2 by FDA under an Emergency Use Authorization (EUA). This EUA will remain in effect (meaning this test can be used) for the duration of the COVID-19 declaration under Section 564(b)(1) of the Act, 21 U.S.C. section 360bbb-3(b)(1), unless the authorization is terminated or revoked.  Performed at [facility], [place] [address] [place], [place] [address]     Labs: Basic Metabolic Panel: Recent Labs  Lab 12/07/20 1539 12/08/20 0521 12/09/20 0450  NA 138 131* 137  K 4.9 4.7 4.9  CL 108 104 105  CO2 18* 19* 24  GLUCOSE 126* 91 99  BUN 51* 43* 34*  CREATININE 3.20* 2.62* 2.44*  CALCIUM 8.7* 7.8* 8.6*   Liver Function Tests: Recent Labs  Lab 12/07/20 1539 12/08/20 0521  AST 70* 51*  ALT 49* 36  ALKPHOS 286* 231*  BILITOT 1.6* 1.9*  PROT 7.8 6.5  ALBUMIN 3.5 3.0*   No results for input(s): LIPASE, AMYLASE in the last 168 hours. No results for input(s): AMMONIA in the last 168 hours.  CBC: Recent Labs  Lab 12/07/20 1539 12/08/20 0521 12/09/20 0450  WBC 19.8* 20.2* 17.3*  NEUTROABS 13.1*  --   --   HGB 6.9* 7.2* 7.4*  HCT 19.4* 20.2* 20.6*  MCV 107.8* 102.5* 101.0*  PLT 204 205 231   Time coordinating discharge: 50 minutes  Signed:  [facility] 12/10/2020, 2:15 PM

## 2020-12-14 ENCOUNTER — Telehealth: Payer: Self-pay

## 2020-12-14 NOTE — Telephone Encounter (Signed)
Transition Care Management Unsuccessful Follow-up Telephone Call  Date of discharge and from where:  12/10/2020 / [facility]  Attempts:  1st Attempt  Reason for unsuccessful TCM follow-up call:  Left voice message  Quinn Plowman RN,BSN,CCM RN Case Manager [place].   [phone]

## 2020-12-15 DIAGNOSIS — D571 Sickle-cell disease without crisis: Secondary | ICD-10-CM | POA: Diagnosis not present

## 2020-12-15 DIAGNOSIS — N183 Chronic kidney disease, stage 3 unspecified: Secondary | ICD-10-CM | POA: Diagnosis not present

## 2020-12-15 DIAGNOSIS — Z79899 Other long term (current) drug therapy: Secondary | ICD-10-CM | POA: Diagnosis not present

## 2020-12-15 DIAGNOSIS — Z23 Encounter for immunization: Secondary | ICD-10-CM | POA: Diagnosis not present

## 2020-12-15 DIAGNOSIS — Z7964 Long term (current) use of myelosuppressive agent: Secondary | ICD-10-CM | POA: Diagnosis not present

## 2020-12-17 ENCOUNTER — Telehealth: Payer: Self-pay

## 2020-12-17 NOTE — Telephone Encounter (Signed)
Transition Care Management Follow-up Telephone Call Date of discharge and from where: 12/10/2020  [facility] How have you been since you were released from the hospital? " Doing better" Any questions or concerns? No  Items Reviewed: Did the pt receive and understand the discharge instructions provided? Yes  Medications obtained and verified? Yes  Other? No  Any new allergies since your discharge? No  Dietary orders reviewed? Yes Do you have support at home? Yes   Home Care and Equipment/Supplies: Were home health services ordered? not applicable If so, what is the name of the agency?  Has the agency set up a time to come to the patient's home? not applicable Were any new equipment or medical supplies ordered?   What is the name of the medical supply agency?  Were you able to get the supplies/equipment? not applicable Do you have any questions related to the use of the equipment or supplies?   Functional Questionnaire: (I = Independent and D = Dependent) ADLs: I  Bathing/Dressing- I  Meal Prep- I  Eating- I  Maintaining continence- I  Transferring/Ambulation- I  Managing Meds- I  Follow up appointments reviewed:  PCP Hospital f/u appt confirmed? Yes   Reports in 1 month [facility] f/u appt confirmed? Yes   Reports in 1  month Are transportation arrangements needed? No  If their condition worsens, is the pt aware to call PCP or go to the Emergency Dept.? Yes Was the patient provided with contact information for the PCP's office or ED? Yes Was to pt encouraged to call back with questions or concerns? Yes   Tomasa Rand, RN, BSN, CEN [facility] [employer] [phone]

## 2020-12-21 ENCOUNTER — Telehealth: Payer: Self-pay

## 2020-12-21 ENCOUNTER — Other Ambulatory Visit: Payer: Self-pay | Admitting: Family Medicine

## 2020-12-21 ENCOUNTER — Other Ambulatory Visit (HOSPITAL_COMMUNITY): Payer: Self-pay

## 2020-12-21 DIAGNOSIS — G894 Chronic pain syndrome: Secondary | ICD-10-CM

## 2020-12-21 MED ORDER — OXYCODONE HCL 10 MG PO TABS
10.0000 mg | ORAL_TABLET | Freq: Four times a day (QID) | ORAL | 0 refills | Status: DC | PRN
Start: 2020-12-21 — End: 2020-12-31
  Filled 2020-12-21: qty 60, 15d supply, fill #0

## 2020-12-21 NOTE — Progress Notes (Signed)
Reviewed PDMP substance reporting system prior to prescribing opiate medications. No inconsistencies noted.  Meds ordered this encounter  Medications   Oxycodone HCl 10 MG TABS    Sig: Take 1 tablet (10 mg total) by mouth every 6 (six) hours as needed.    Dispense:  60 tablet    Refill:  0    Order Specific Question:   Supervising Provider    Answer:   [name], [name] [[id number]]   [name] Rocco Moore Shada Nienaber  APRN, MSN, FNP-C [facility] [address]  [place], [place] [address] [phone]  

## 2020-12-21 NOTE — Telephone Encounter (Signed)
Oxy Contin 

## 2020-12-23 ENCOUNTER — Non-Acute Institutional Stay (HOSPITAL_COMMUNITY)
Admission: RE | Admit: 2020-12-23 | Discharge: 2020-12-23 | Disposition: A | Discharge status: Home / Self Care | Payer: Medicare Other | Source: Ambulatory Visit | Attending: Internal Medicine | Admitting: Internal Medicine

## 2020-12-23 ENCOUNTER — Other Ambulatory Visit: Payer: Self-pay

## 2020-12-23 ENCOUNTER — Other Ambulatory Visit: Payer: Self-pay | Admitting: Family Medicine

## 2020-12-23 DIAGNOSIS — D571 Sickle-cell disease without crisis: Secondary | ICD-10-CM | POA: Insufficient documentation

## 2020-12-23 DIAGNOSIS — Z79891 Long term (current) use of opiate analgesic: Secondary | ICD-10-CM

## 2020-12-23 DIAGNOSIS — D5700 Hb-SS disease with crisis, unspecified: Secondary | ICD-10-CM

## 2020-12-23 LAB — COMPREHENSIVE METABOLIC PANEL
ALT: 38 U/L (ref 0–44)
AST: 49 U/L — ABNORMAL HIGH (ref 15–41)
Albumin: 3.2 g/dL — ABNORMAL LOW (ref 3.5–5.0)
Alkaline Phosphatase: 189 U/L — ABNORMAL HIGH (ref 38–126)
Anion gap: 8 (ref 5–15)
BUN: 38 mg/dL — ABNORMAL HIGH (ref 6–20)
CO2: 23 mmol/L (ref 22–32)
Calcium: 8.2 mg/dL — ABNORMAL LOW (ref 8.9–10.3)
Chloride: 105 mmol/L (ref 98–111)
Creatinine, Ser: 3.26 mg/dL — ABNORMAL HIGH (ref 0.61–1.24)
GFR, Estimated: 24 mL/min — ABNORMAL LOW (ref >60)
Glucose, Bld: 100 mg/dL — ABNORMAL HIGH (ref 70–99)
Potassium: 5.5 mmol/L — ABNORMAL HIGH (ref 3.5–5.1)
Sodium: 136 mmol/L (ref 135–145)
Total Bilirubin: 0.7 mg/dL (ref 0.3–1.2)
Total Protein: 6.7 g/dL (ref 6.5–8.1)

## 2020-12-23 LAB — CBC WITH DIFFERENTIAL/PLATELET
Abs Immature Granulocytes: 0.04 10*3/uL (ref 0.00–0.07)
Basophils Absolute: 0.1 10*3/uL (ref 0.0–0.1)
Basophils Relative: 0 %
Eosinophils Absolute: 0.3 10*3/uL (ref 0.0–0.5)
Eosinophils Relative: 2 %
HCT: 23.8 % — ABNORMAL LOW (ref 39.0–52.0)
Hemoglobin: 7.9 g/dL — ABNORMAL LOW (ref 13.0–17.0)
Immature Granulocytes: 0 %
Lymphocytes Relative: 56 %
Lymphs Abs: 9.3 10*3/uL — ABNORMAL HIGH (ref 0.7–4.0)
MCH: 37.4 pg — ABNORMAL HIGH (ref 26.0–34.0)
MCHC: 33.2 g/dL (ref 30.0–36.0)
MCV: 112.8 fL — ABNORMAL HIGH (ref 80.0–100.0)
Monocytes Absolute: 1.2 10*3/uL — ABNORMAL HIGH (ref 0.1–1.0)
Monocytes Relative: 7 %
Neutro Abs: 5.8 10*3/uL (ref 1.7–7.7)
Neutrophils Relative %: 35 %
Platelets: 325 10*3/uL (ref 150–400)
RBC: 2.11 MIL/uL — ABNORMAL LOW (ref 4.22–5.81)
RDW: 21.8 % — ABNORMAL HIGH (ref 11.5–15.5)
WBC: 16.7 10*3/uL — ABNORMAL HIGH (ref 4.0–10.5)
nRBC: 0 % (ref 0.0–0.2)

## 2020-12-23 LAB — RETICULOCYTES
Immature Retic Fract: 27.8 % — ABNORMAL HIGH (ref 2.3–15.9)
RBC.: 2.11 MIL/uL — ABNORMAL LOW (ref 4.22–5.81)
Retic Count, Absolute: 85.9 10*3/uL (ref 19.0–186.0)
Retic Ct Pct: 4.1 % — ABNORMAL HIGH (ref 0.4–3.1)

## 2020-12-23 LAB — RAPID URINE DRUG SCREEN, HOSP PERFORMED
Amphetamines: NOT DETECTED
Barbiturates: NOT DETECTED
Benzodiazepines: POSITIVE — AB
Cocaine: NOT DETECTED
Opiates: POSITIVE — AB
Tetrahydrocannabinol: POSITIVE — AB

## 2020-12-23 NOTE — Progress Notes (Signed)
Day hospital lab orders placed.   [name]  APRN, MSN, FNP-C Patient [place] [address] [place], [place] [address] [phone]

## 2020-12-23 NOTE — Progress Notes (Addendum)
Pt here for labs as ordered by [place] Hollis FNP. CBC w diff, Reticulocyte, CMP and UDS collected from patient without difficulty. Per [place] FNP, pt  made aware that he does not need to wait at the patient care center for lab results today. Pt declines AVS and states that he is to RTC on 11/29 , will discuss with PCP if he will need labwork at this time. Pt alert, oriented and ambulatory at discharge.

## 2020-12-27 DIAGNOSIS — M25562 Pain in left knee: Secondary | ICD-10-CM | POA: Diagnosis not present

## 2020-12-27 DIAGNOSIS — M25561 Pain in right knee: Secondary | ICD-10-CM | POA: Diagnosis not present

## 2020-12-27 DIAGNOSIS — G894 Chronic pain syndrome: Secondary | ICD-10-CM | POA: Diagnosis not present

## 2020-12-27 DIAGNOSIS — G8929 Other chronic pain: Secondary | ICD-10-CM | POA: Diagnosis not present

## 2020-12-29 ENCOUNTER — Telehealth: Payer: Self-pay

## 2020-12-29 NOTE — Telephone Encounter (Signed)
Oxycodone 15 mg  due on Friday!

## 2020-12-31 ENCOUNTER — Other Ambulatory Visit (HOSPITAL_COMMUNITY): Payer: Self-pay

## 2020-12-31 ENCOUNTER — Other Ambulatory Visit: Payer: Self-pay | Admitting: Family Medicine

## 2020-12-31 DIAGNOSIS — G894 Chronic pain syndrome: Secondary | ICD-10-CM

## 2020-12-31 DIAGNOSIS — D5700 Hb-SS disease with crisis, unspecified: Secondary | ICD-10-CM

## 2020-12-31 MED ORDER — OXYCODONE HCL 10 MG PO TABS
10.0000 mg | ORAL_TABLET | Freq: Four times a day (QID) | ORAL | 0 refills | Status: DC | PRN
Start: 2021-01-03 — End: 2021-02-02
  Filled 2021-01-03: qty 60, 15d supply, fill #0

## 2020-12-31 MED ORDER — OXYCODONE HCL ER 15 MG PO T12A
15.0000 mg | EXTENDED_RELEASE_TABLET | Freq: Two times a day (BID) | ORAL | 0 refills | Status: DC
Start: 2021-01-03 — End: 2021-01-14

## 2020-12-31 NOTE — Progress Notes (Signed)
Reviewed PDMP substance reporting system prior to prescribing opiate medications. No inconsistencies noted.  Meds ordered this encounter  Medications   Oxycodone HCl 10 MG TABS    Sig: Take 1 tablet (10 mg total) by mouth every 6 (six) hours as needed.    Dispense:  60 tablet    Refill:  0    Order Specific Question:   Supervising Provider    Answer:   [name] [[id number]]   oxyCODONE (OXYCONTIN) 15 mg 12 hr tablet    Sig: Take 1 tablet (15 mg total) by mouth every 12 (twelve) hours.    Dispense:  60 tablet    Refill:  0    Order Specific Question:   Supervising Provider    Answer:   [name] [[id number]]     [name]  APRN, MSN, FNP-C Patient [place] [address] [place], [place] [address] [phone]

## 2021-01-01 ENCOUNTER — Other Ambulatory Visit (HOSPITAL_COMMUNITY): Payer: Self-pay

## 2021-01-03 ENCOUNTER — Other Ambulatory Visit (HOSPITAL_COMMUNITY): Payer: Self-pay

## 2021-01-11 ENCOUNTER — Encounter: Payer: Self-pay | Admitting: Family Medicine

## 2021-01-11 ENCOUNTER — Other Ambulatory Visit: Payer: Self-pay

## 2021-01-11 ENCOUNTER — Ambulatory Visit (INDEPENDENT_AMBULATORY_CARE_PROVIDER_SITE_OTHER): Payer: Medicare Other | Admitting: Family Medicine

## 2021-01-11 VITALS — BP 116/74 | HR 95 | Temp 98.8°F | Ht 63.0 in | Wt 119.6 lb

## 2021-01-11 DIAGNOSIS — E559 Vitamin D deficiency, unspecified: Secondary | ICD-10-CM

## 2021-01-11 DIAGNOSIS — G894 Chronic pain syndrome: Secondary | ICD-10-CM | POA: Diagnosis not present

## 2021-01-11 DIAGNOSIS — F1290 Cannabis use, unspecified, uncomplicated: Secondary | ICD-10-CM | POA: Diagnosis not present

## 2021-01-11 DIAGNOSIS — N184 Chronic kidney disease, stage 4 (severe): Secondary | ICD-10-CM

## 2021-01-11 DIAGNOSIS — D5700 Hb-SS disease with crisis, unspecified: Secondary | ICD-10-CM

## 2021-01-11 LAB — POCT URINALYSIS DIP (CLINITEK)
Bilirubin, UA: NEGATIVE
Glucose, UA: 100 mg/dL — AB
Ketones, POC UA: NEGATIVE mg/dL
Leukocytes, UA: NEGATIVE
Nitrite, UA: NEGATIVE
POC PROTEIN,UA: >=300 — AB
Spec Grav, UA: 1.025 (ref 1.010–1.025)
Urobilinogen, UA: 0.2 E.U./dL
pH, UA: 7 (ref 5.0–8.0)

## 2021-01-11 NOTE — Progress Notes (Signed)
[name]   Established Patient Office Visit  Subjective:  Patient ID: Tiquan Bouch., male    DOB: 11/13/1980  Age: 40 y.o. MRN: [id number]  CC:  Chief Complaint  Patient presents with   Follow-up    Pt is here today for his follow up visit.  No concerns or issues today.    HPI Vitor Overbaugh. is a 40 year old male with a medical history significant for sickle cell disease, chronic pain syndrome, opiate dependence and tolerance, history of polysubstance abuse, history of stage IV chronic kidney disease, and history of anemia of chronic disease presents for follow-up of chronic conditions.  Patient states that he is doing well and is without complaint.  He says that he has been taking all prescribed medications consistently.  Patient is following closely with his hematology team at [facility].  Anemia seems to have been improving over the past several months.  Patient has been consistently taking hydroxyurea and Oxbryta.  He also states that he is hydrating consistently.  Patient is complaining of pain primarily to low back and lower extremities.  His chronic pain is moderately controlled on oxycodone and OxyContin.  His pain intensity today is 5/10.  He currently denies any headache, chest pain, shortness of breath, urinary symptoms, nausea, vomiting, or diarrhea. Past Medical History:  Diagnosis Date   Abscess of right iliac fossa 09/24/2018   required Perc Drain 09/24/2018   Arachnoid Cyst of brain bilaterally    "2 really small ones in the back of my head; inside; saw them w/MRI" (09/25/2012)   Bacterial pneumonia ~ 2012   "caught it here in the hospital" (09/25/2012)   Chronic kidney disease    "from my sickle cell" (09/25/2012)   CKD (chronic kidney disease), stage II    Colitis 04/19/2017   CT scan abd/ pelvis   GERD (gastroesophageal reflux disease)    "after I eat alot of spicey foods" (09/25/2012)   Gynecomastia, male  07/10/2012   History of blood transfusion    "always related to sickle cell crisis" (09/25/2012)   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   Noted in noted from Hematology notes at [place]    "take RX qd to prevent them" (09/25/2012)   Opioid dependence with withdrawal ([place]) 08/30/2012   Renal insufficiency    Sickle cell anemia (HCC)    Sickle cell crisis ([place]) 09/25/2012   Sickle cell nephropathy ([place]) 07/10/2012   Sinus tachycardia    Tachycardia with heart rate 121-140 beats per minute with ambulation 08/04/2016    Past Surgical History:  Procedure Laterality Date   ABCESS DRAINAGE     CHOLECYSTECTOMY  ~ 2012   COLONOSCOPY N/A 04/23/2017   Procedure: COLONOSCOPY;  Surgeon: Irene Shipper, MD;  Location: [facility];  Service: Endoscopy;  Laterality: N/A;   IR FLUORO GUIDE CV LINE RIGHT  12/17/2016   IR RADIOLOGIST EVAL & MGMT  10/02/2018   IR RADIOLOGIST EVAL & MGMT  10/15/2018   IR REMOVAL TUN CV CATH W/O FL  12/21/2016   IR US GUIDE VASC ACCESS RIGHT  12/17/2016   spleenectomy      Family History  Problem Relation Age of Onset   Breast cancer Mother     Social History   Socioeconomic History   Marital status: Single    Spouse name: Not on file   Number of children: Not on file   Years of education: Not on file  Highest education level: Not on file  Occupational History   Not on file  Tobacco Use   Smoking status: Some Days    Packs/day: 0.10    Types: Cigarettes   Smokeless tobacco: Never   Tobacco comments:    09/25/2012 "I don't buy cigarettes; bum one from friends q now and then"  Vaping Use   Vaping Use: Never used  Substance and Sexual Activity   Alcohol use: Yes    Alcohol/week: 0.0 standard drinks    Comment: Once a month   Drug use: Yes    Types: Marijuana    Comment: Every 2-3 weeks    Sexual activity: Yes    Birth control/protection: Condom  Other Topics Concern   Not on file  Social History Narrative    Lives alone in  [place].   Back at school, studying Public relations account executive. Unemployed.    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    Social Determinants of Health   Financial Resource Strain: Low Risk    Difficulty of Paying Living Expenses: Not hard at all  Food Insecurity: No Food Insecurity   Worried About Charity fundraiser in the Last Year: Never true   Arboriculturist in the Last Year: Never true  Transportation Needs: Unmet Transportation Needs   Lack of Transportation (Medical): Yes   Lack of Transportation (Non-Medical): Yes  Physical Activity: Inactive   Days of Exercise per Week: 0 days   Minutes of Exercise per Session: 0 min  Stress: Stress Concern Present   Feeling of Stress : To some extent  Social Connections: Socially Isolated   Frequency of Communication with Friends and Family: More than three times a week   Frequency of Social Gatherings with Friends and Family: More than three times a week   Attends Religious Services: Never   Marine scientist or Organizations: No   Attends Music therapist: Never   Marital Status: Divorced  Human resources officer Violence: Not At Risk   Fear of Current or Ex-Partner: No   Emotionally Abused: No   Physically Abused: No   Sexually Abused: No    Outpatient Medications Prior to Visit  Medication Sig Dispense Refill   acetaminophen (TYLENOL) 650 MG CR tablet Take 650 mg by mouth every 8 (eight) hours as needed for pain.     calcitRIOL (ROCALTROL) 0.25 MCG capsule Take 0.25 mcg by mouth daily.     Deferiprone, Twice Daily, (FERRIPROX TWICE-A-DAY) 1000 MG TABS Take 1,000 mg by mouth 2 (two) times daily.     folic acid (FOLVITE) 1 MG tablet TAKE 1 TABLET BY MOUTH EVERY DAY (Patient taking differently: Take 1 mg by mouth daily.) 90 tablet 4   hydroxyurea (HYDREA) 500 MG capsule Take 500 mg  by mouth daily.     LOKELMA 10 g PACK packet Take 10 g by mouth every other day.     melatonin 3 MG TABS tablet Take 1 tablet (3 mg total) by mouth at bedtime. 30 tablet 2   nortriptyline (PAMELOR) 25 MG capsule Take 25 mg by mouth 2 (two) times daily.     OXBRYTA 500 MG TABS tablet Take 1,500 mg by mouth daily.     oxyCODONE (OXYCONTIN) 15 mg 12 hr tablet Take 1 tablet (15 mg total) by mouth every 12 (twelve) hours.  **  01/03/2021 60 tablet 0   Oxycodone HCl 10 MG TABS Take 1 tablet (10 mg total) by mouth every 6 (six) hours as needed.  **01/03/2021 60 tablet 0   sodium bicarbonate 650 MG tablet Take 1,950 mg by mouth 3 (three) times daily.     No facility-administered medications prior to visit.    Allergies  Allergen Reactions   Nsaids Other (See Comments)    Kidney disease   Morphine And Related Other (See Comments)    "Real bad headaches"     ROS Review of Systems  Constitutional: Negative.   HENT: Negative.    Eyes: Negative.   Respiratory: Negative.    Gastrointestinal: Negative.   Endocrine: Negative.   Genitourinary: Negative.   Musculoskeletal: Negative.   Skin: Negative.   Psychiatric/Behavioral: Negative.       Objective:    Physical Exam Constitutional:      Appearance: Normal appearance.  Cardiovascular:     Rate and Rhythm: Normal rate.     Heart sounds: Murmur heard.  Skin:    General: Skin is warm.  Neurological:     General: No focal deficit present.     Mental Status: He is alert. Mental status is at baseline.  Psychiatric:        Mood and Affect: Mood normal.        Behavior: Behavior normal.        Thought Content: Thought content normal.        Judgment: Judgment normal.    BP 116/74   Pulse 95   Temp 98.8 F (37.1 C)   Ht _0  (1.6 m)   Wt 119 lb 9.6 oz (54.3 kg)   SpO2 100%   BMI 21.19 kg/m  Wt Readings from Last 3 Encounters:  01/11/21 119 lb 9.6 oz (54.3 kg)  12/10/20 108 lb 0.4 oz (49 kg)  10/23/20 113 lb 6.4 oz (51.4 kg)      Health Maintenance Due  Topic Date Due   COVID-19 Vaccine (3 - Moderna risk series) 07/24/2019    There are no preventive care reminders to display for this patient.  Lab Results  Component Value Date   TSH 2.860 05/11/2017   Lab Results  Component Value Date   WBC 16.7 (H) 12/23/2020   HGB 7.9 (L) 12/23/2020   HCT 23.8 (L) 12/23/2020   MCV 112.8 (H) 12/23/2020   PLT 325 12/23/2020   Lab Results  Component Value Date   NA 136 12/23/2020   K 5.5 (H) 12/23/2020   CO2 23 12/23/2020   GLUCOSE 100 (H) 12/23/2020   BUN 38 (H) 12/23/2020   CREATININE 3.26 (H) 12/23/2020   BILITOT 0.7 12/23/2020   ALKPHOS 189 (H) 12/23/2020   AST 49 (H) 12/23/2020   ALT 38 12/23/2020   PROT 6.7 12/23/2020   ALBUMIN 3.2 (L) 12/23/2020   CALCIUM 8.2 (L) 12/23/2020   ANIONGAP 8 12/23/2020   EGFR 25 (L) 07/14/2020   Lab Results  Component Value Date   CHOL 145 03/07/2018   Lab Results  Component Value Date   HDL 65 03/07/2018   Lab Results  Component Value Date   LDLCALC 30 03/07/2018   Lab Results  Component Value Date   TRIG 251 (H) 03/07/2018   Lab Results  Component Value Date   CHOLHDL 2.2 03/07/2018   Lab Results  Component Value Date   HGBA1C <4.2 (L) 03/07/2018      Assessment & Plan:   Problem List Items Addressed  This Visit       Other   Vitamin D deficiency   Relevant Orders   Sickle Cell Panel   Sickle-cell disease with pain ([place]) - Primary   Relevant Orders   POCT URINALYSIS DIP (CLINITEK) (Completed)   Sickle Cell Panel   Marijuana use   Chronic pain syndrome   Relevant Orders   [id number] 11+Oxyco+Alc+Crt-Bund   1. Sickle-cell disease with pain ([place]) Recommended patient continues close follow-up with his hematology team.  Overall, patient's hospitalizations have improved greatly. - POCT URINALYSIS DIP (CLINITEK) - Sickle Cell Panel  2. Chronic pain syndrome  - [id number] 11+Oxyco+Alc+Crt-Bund  3. Vitamin D deficiency  - Sickle Cell  Panel  4. Marijuana use Patient counseled extensively on the dangers of smoking especially in the setting of sickle cell disease  5.  Chronic kidney disease stage IV: Continue to follow closely with nephrology    Follow-up: Return in about 3 months (around 04/12/2021).    [name]  APRN, MSN, FNP-C Patient [facility] [address] [place], [place] [address] [phone]

## 2021-01-12 LAB — CMP14+CBC/D/PLT+FER+RETIC+V...
ALT: 101 IU/L — ABNORMAL HIGH (ref 0–44)
AST: 134 IU/L — ABNORMAL HIGH (ref 0–40)
Albumin/Globulin Ratio: 1.5 (ref 1.2–2.2)
Albumin: 3.9 g/dL — ABNORMAL LOW (ref 4.0–5.0)
Alkaline Phosphatase: 281 IU/L — ABNORMAL HIGH (ref 44–121)
BUN/Creatinine Ratio: 14 (ref 9–20)
BUN: 38 mg/dL — ABNORMAL HIGH (ref 6–20)
Basophils Absolute: 0 10*3/uL (ref 0.0–0.2)
Basos: 0 %
Bilirubin Total: 1 mg/dL (ref 0.0–1.2)
CO2: 18 mmol/L — ABNORMAL LOW (ref 20–29)
Calcium: 8.3 mg/dL — ABNORMAL LOW (ref 8.7–10.2)
Chloride: 108 mmol/L — ABNORMAL HIGH (ref 96–106)
Creatinine, Ser: 2.68 mg/dL — ABNORMAL HIGH (ref 0.76–1.27)
EOS (ABSOLUTE): 0.2 10*3/uL (ref 0.0–0.4)
Eos: 1 %
Ferritin: 8585 ng/mL — ABNORMAL HIGH (ref 30–400)
Globulin, Total: 2.6 g/dL (ref 1.5–4.5)
Glucose: 85 mg/dL (ref 70–99)
Hematocrit: 23.7 % — ABNORMAL LOW (ref 37.5–51.0)
Hemoglobin: 8.6 g/dL — ABNORMAL LOW (ref 13.0–17.7)
Immature Grans (Abs): 0 10*3/uL (ref 0.0–0.1)
Immature Granulocytes: 0 %
Lymphocytes Absolute: 6.4 10*3/uL — ABNORMAL HIGH (ref 0.7–3.1)
Lymphs: 43 %
MCH: 39.8 pg — ABNORMAL HIGH (ref 26.6–33.0)
MCHC: 36.3 g/dL — ABNORMAL HIGH (ref 31.5–35.7)
MCV: 110 fL — ABNORMAL HIGH (ref 79–97)
Monocytes Absolute: 1.3 10*3/uL — ABNORMAL HIGH (ref 0.1–0.9)
Monocytes: 9 %
Neutrophils Absolute: 7 10*3/uL (ref 1.4–7.0)
Neutrophils: 47 %
Platelets: 272 10*3/uL (ref 150–450)
Potassium: 5 mmol/L (ref 3.5–5.2)
RBC: 2.16 x10E6/uL — CL (ref 4.14–5.80)
RDW: 20.7 % — ABNORMAL HIGH (ref 11.6–15.4)
Retic Ct Pct: 8.5 % — ABNORMAL HIGH (ref 0.6–2.6)
Sodium: 144 mmol/L (ref 134–144)
Total Protein: 6.5 g/dL (ref 6.0–8.5)
Vit D, 25-Hydroxy: 17.4 ng/mL — ABNORMAL LOW (ref 30.0–100.0)
WBC: 14.9 10*3/uL — ABNORMAL HIGH (ref 3.4–10.8)
eGFR: 30 mL/min/{1.73_m2} — ABNORMAL LOW (ref >59)

## 2021-01-13 ENCOUNTER — Telehealth: Payer: Self-pay | Admitting: Family Medicine

## 2021-01-13 NOTE — Telephone Encounter (Signed)
Valdis Bevill is a 40 year old male with a medical history significant for sickle cell disease.  Patient was evaluated in clinic on 01/11/2021 for sickle cell disease and chronic pain syndrome.  All laboratory values were reviewed, ferritin level markedly elevated.  Continue Ferriprox per hematology.  Will defer to hematology for treatment of this problem. Also, vitamin D level continues to be low, remind patient of the importance of taking all medications consistently in order to achieve positive outcomes.  Recommend vitamin D fortified foods including milk, orange juice, yogurt, etc. follow-up in 3 months as scheduled.   [name]  APRN, MSN, FNP-C Patient [facility] [address] [place], [place] [address] [phone]

## 2021-01-14 ENCOUNTER — Other Ambulatory Visit: Payer: Self-pay | Admitting: Family Medicine

## 2021-01-14 ENCOUNTER — Telehealth: Payer: Self-pay

## 2021-01-14 ENCOUNTER — Other Ambulatory Visit (HOSPITAL_COMMUNITY): Payer: Self-pay

## 2021-01-14 DIAGNOSIS — D5700 Hb-SS disease with crisis, unspecified: Secondary | ICD-10-CM

## 2021-01-14 DIAGNOSIS — G894 Chronic pain syndrome: Secondary | ICD-10-CM

## 2021-01-14 MED ORDER — OXYCODONE HCL ER 15 MG PO T12A
15.0000 mg | EXTENDED_RELEASE_TABLET | Freq: Two times a day (BID) | ORAL | 0 refills | Status: AC
  Filled 2021-01-14 – 2021-01-18 (×2): qty 60, 30d supply, fill #0

## 2021-01-14 NOTE — Telephone Encounter (Signed)
Oxycontin 10mg 

## 2021-01-14 NOTE — Progress Notes (Signed)
Reviewed PDMP substance reporting system prior to prescribing opiate medications. No inconsistencies noted.  Meds ordered this encounter  Medications   oxyCODONE (OXYCONTIN) 15 mg 12 hr tablet    Sig: Take 1 tablet (15 mg total) by mouth every 12 (twelve) hours.  **01/03/2021    Dispense:  60 tablet    Refill:  0    Order Specific Question:   Supervising Provider    Answer:   [name] [[id number]]   [name]  APRN, MSN, FNP-C Patient [facility] [address] [place], [place] [address] [phone]

## 2021-01-17 ENCOUNTER — Other Ambulatory Visit (HOSPITAL_COMMUNITY): Payer: Self-pay

## 2021-01-18 ENCOUNTER — Other Ambulatory Visit: Payer: Self-pay | Admitting: Family Medicine

## 2021-01-18 ENCOUNTER — Other Ambulatory Visit (HOSPITAL_COMMUNITY): Payer: Self-pay

## 2021-01-18 ENCOUNTER — Telehealth: Payer: Self-pay

## 2021-01-18 NOTE — Telephone Encounter (Signed)
Oxy contin 10 mg

## 2021-01-21 LAB — DRUG SCREEN 764883 11+OXYCO+ALC+CRT-BUND
Amphetamines, Urine: NEGATIVE ng/mL
BENZODIAZ UR QL: NEGATIVE ng/mL
Barbiturate: NEGATIVE ng/mL
Cocaine (Metabolite): NEGATIVE ng/mL
Creatinine: 102.5 mg/dL (ref 20.0–300.0)
Ethanol: NEGATIVE %
Meperidine: NEGATIVE ng/mL
Methadone Screen, Urine: NEGATIVE ng/mL
OPIATE SCREEN URINE: NEGATIVE ng/mL
Phencyclidine: NEGATIVE ng/mL
Propoxyphene: NEGATIVE ng/mL
Tramadol: NEGATIVE ng/mL
pH, Urine: 6.7 (ref 4.5–8.9)

## 2021-01-21 LAB — OXYCODONE/OXYMORPHONE, CONFIRM
OXYCODONE/OXYMORPH: POSITIVE — AB
OXYCODONE: 1030 ng/mL
OXYCODONE: POSITIVE — AB
OXYMORPHONE (GC/MS): 1865 ng/mL
OXYMORPHONE: POSITIVE — AB

## 2021-01-21 LAB — CANNABINOID CONFIRMATION, UR
CANNABINOIDS: POSITIVE — AB
Carboxy THC GC/MS Conf: 377 ng/mL

## 2021-01-24 DIAGNOSIS — E875 Hyperkalemia: Secondary | ICD-10-CM | POA: Diagnosis not present

## 2021-01-24 DIAGNOSIS — E872 Acidosis, unspecified: Secondary | ICD-10-CM | POA: Diagnosis not present

## 2021-01-24 DIAGNOSIS — N184 Chronic kidney disease, stage 4 (severe): Secondary | ICD-10-CM | POA: Diagnosis not present

## 2021-01-24 DIAGNOSIS — D5700 Hb-SS disease with crisis, unspecified: Secondary | ICD-10-CM | POA: Diagnosis not present

## 2021-01-24 DIAGNOSIS — N2581 Secondary hyperparathyroidism of renal origin: Secondary | ICD-10-CM | POA: Diagnosis not present

## 2021-01-29 IMAGING — DX DG CHEST 1V PORT
1 series · 1 of 1 positions shown · non-contrast
Comparison: 06/27/2019 and CT chest 02/27/2017.

CLINICAL DATA: Sickle cell crisis.

EXAM:
PORTABLE CHEST 1 VIEW

[chest ap]
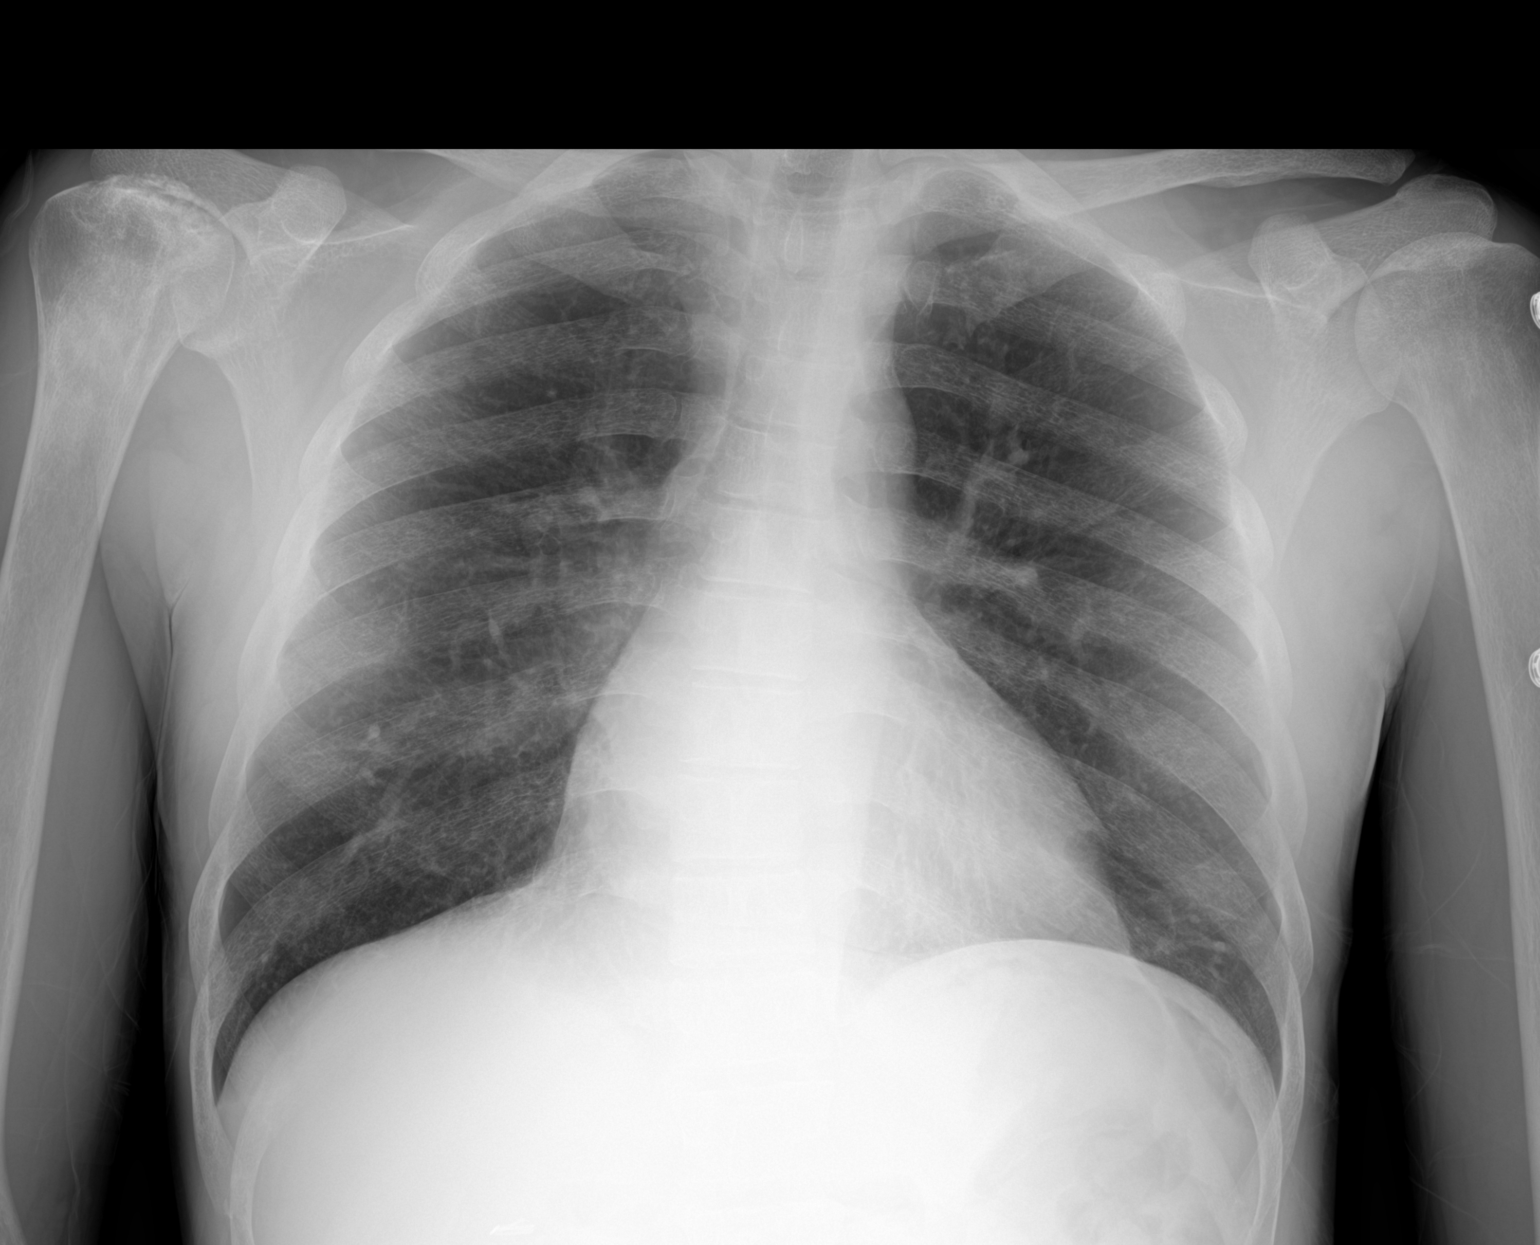

[1 of 1 positions shown; findings below may reference images not displayed]

FINDINGS: Trachea is midline. Heart size stable. Lungs are clear. No pleural
fluid. Changes of avascular necrosis are seen in the right humeral
head. Suspect mild/early changes of avascular necrosis in the left
humeral head.
IMPRESSION: 1. No acute findings.
2. Avascular necrosis in the right humeral head. Suspect early/mild
AVN in the left humeral head.

## 2021-02-01 ENCOUNTER — Telehealth: Payer: Self-pay

## 2021-02-01 NOTE — Telephone Encounter (Signed)
Oxycodone 15 mg 

## 2021-02-02 ENCOUNTER — Other Ambulatory Visit (HOSPITAL_COMMUNITY): Payer: Self-pay

## 2021-02-02 ENCOUNTER — Other Ambulatory Visit: Payer: Self-pay | Admitting: Internal Medicine

## 2021-02-02 DIAGNOSIS — G894 Chronic pain syndrome: Secondary | ICD-10-CM

## 2021-02-02 MED ORDER — OXYCODONE HCL 10 MG PO TABS
10.0000 mg | ORAL_TABLET | Freq: Four times a day (QID) | ORAL | 0 refills | Status: AC | PRN
  Filled 2021-02-02: qty 60, 15d supply, fill #0

## 2021-02-03 ENCOUNTER — Other Ambulatory Visit: Payer: Self-pay | Admitting: Internal Medicine

## 2021-02-06 ENCOUNTER — Encounter (HOSPITAL_COMMUNITY): Payer: Self-pay | Admitting: Emergency Medicine

## 2021-02-06 ENCOUNTER — Other Ambulatory Visit: Payer: Self-pay

## 2021-02-06 ENCOUNTER — Inpatient Hospital Stay (HOSPITAL_COMMUNITY)
Admission: EM | Admit: 2021-02-06 | Discharge: 2021-02-11 | DRG: 812 | Disposition: A | Discharge status: Home / Self Care | Payer: Medicare Other | Attending: Family Medicine | Admitting: Family Medicine

## 2021-02-06 ENCOUNTER — Emergency Department (HOSPITAL_COMMUNITY): Payer: Medicare Other

## 2021-02-06 DIAGNOSIS — R0602 Shortness of breath: Secondary | ICD-10-CM | POA: Diagnosis not present

## 2021-02-06 DIAGNOSIS — K219 Gastro-esophageal reflux disease without esophagitis: Secondary | ICD-10-CM | POA: Diagnosis present

## 2021-02-06 DIAGNOSIS — N179 Acute kidney failure, unspecified: Secondary | ICD-10-CM | POA: Diagnosis present

## 2021-02-06 DIAGNOSIS — Z803 Family history of malignant neoplasm of breast: Secondary | ICD-10-CM | POA: Diagnosis not present

## 2021-02-06 DIAGNOSIS — D5700 Hb-SS disease with crisis, unspecified: Principal | ICD-10-CM | POA: Diagnosis present

## 2021-02-06 DIAGNOSIS — F17200 Nicotine dependence, unspecified, uncomplicated: Secondary | ICD-10-CM | POA: Diagnosis present

## 2021-02-06 DIAGNOSIS — J42 Unspecified chronic bronchitis: Secondary | ICD-10-CM | POA: Diagnosis present

## 2021-02-06 DIAGNOSIS — Z20822 Contact with and (suspected) exposure to covid-19: Secondary | ICD-10-CM | POA: Diagnosis present

## 2021-02-06 DIAGNOSIS — G894 Chronic pain syndrome: Secondary | ICD-10-CM | POA: Diagnosis present

## 2021-02-06 DIAGNOSIS — N184 Chronic kidney disease, stage 4 (severe): Secondary | ICD-10-CM | POA: Diagnosis present

## 2021-02-06 DIAGNOSIS — D57219 Sickle-cell/Hb-C disease with crisis, unspecified: Secondary | ICD-10-CM | POA: Diagnosis not present

## 2021-02-06 DIAGNOSIS — Z452 Encounter for adjustment and management of vascular access device: Secondary | ICD-10-CM | POA: Diagnosis not present

## 2021-02-06 DIAGNOSIS — R7401 Elevation of levels of liver transaminase levels: Secondary | ICD-10-CM | POA: Diagnosis present

## 2021-02-06 DIAGNOSIS — N058 Unspecified nephritic syndrome with other morphologic changes: Secondary | ICD-10-CM | POA: Diagnosis present

## 2021-02-06 DIAGNOSIS — R000 Tachycardia, unspecified: Secondary | ICD-10-CM | POA: Diagnosis not present

## 2021-02-06 DIAGNOSIS — N08 Glomerular disorders in diseases classified elsewhere: Secondary | ICD-10-CM | POA: Diagnosis present

## 2021-02-06 DIAGNOSIS — N189 Chronic kidney disease, unspecified: Secondary | ICD-10-CM | POA: Diagnosis present

## 2021-02-06 DIAGNOSIS — G43909 Migraine, unspecified, not intractable, without status migrainosus: Secondary | ICD-10-CM | POA: Diagnosis present

## 2021-02-06 DIAGNOSIS — F17210 Nicotine dependence, cigarettes, uncomplicated: Secondary | ICD-10-CM | POA: Diagnosis present

## 2021-02-06 DIAGNOSIS — E869 Volume depletion, unspecified: Secondary | ICD-10-CM | POA: Diagnosis present

## 2021-02-06 DIAGNOSIS — D638 Anemia in other chronic diseases classified elsewhere: Secondary | ICD-10-CM | POA: Diagnosis present

## 2021-02-06 LAB — CBC WITH DIFFERENTIAL/PLATELET
Abs Immature Granulocytes: 0.08 10*3/uL — ABNORMAL HIGH (ref 0.00–0.07)
Basophils Absolute: 0.1 10*3/uL (ref 0.0–0.1)
Basophils Relative: 0 %
Eosinophils Absolute: 0.5 10*3/uL (ref 0.0–0.5)
Eosinophils Relative: 3 %
HCT: 20.3 % — ABNORMAL LOW (ref 39.0–52.0)
Hemoglobin: 7.1 g/dL — ABNORMAL LOW (ref 13.0–17.0)
Immature Granulocytes: 0 %
Lymphocytes Relative: 44 %
Lymphs Abs: 8.6 10*3/uL — ABNORMAL HIGH (ref 0.7–4.0)
MCH: 39 pg — ABNORMAL HIGH (ref 26.0–34.0)
MCHC: 35 g/dL (ref 30.0–36.0)
MCV: 111.5 fL — ABNORMAL HIGH (ref 80.0–100.0)
Monocytes Absolute: 1.7 10*3/uL — ABNORMAL HIGH (ref 0.1–1.0)
Monocytes Relative: 8 %
Neutro Abs: 8.8 10*3/uL — ABNORMAL HIGH (ref 1.7–7.7)
Neutrophils Relative %: 45 %
Platelets: 209 10*3/uL (ref 150–400)
RBC: 1.82 MIL/uL — ABNORMAL LOW (ref 4.22–5.81)
RDW: 23.7 % — ABNORMAL HIGH (ref 11.5–15.5)
WBC: 19.7 10*3/uL — ABNORMAL HIGH (ref 4.0–10.5)
nRBC: 0.2 % (ref 0.0–0.2)

## 2021-02-06 LAB — COMPREHENSIVE METABOLIC PANEL
ALT: 54 U/L — ABNORMAL HIGH (ref 0–44)
AST: 66 U/L — ABNORMAL HIGH (ref 15–41)
Albumin: 3.4 g/dL — ABNORMAL LOW (ref 3.5–5.0)
Alkaline Phosphatase: 250 U/L — ABNORMAL HIGH (ref 38–126)
Anion gap: 9 (ref 5–15)
BUN: 34 mg/dL — ABNORMAL HIGH (ref 6–20)
CO2: 25 mmol/L (ref 22–32)
Calcium: 8.8 mg/dL — ABNORMAL LOW (ref 8.9–10.3)
Chloride: 101 mmol/L (ref 98–111)
Creatinine, Ser: 3.81 mg/dL — ABNORMAL HIGH (ref 0.61–1.24)
GFR, Estimated: 20 mL/min — ABNORMAL LOW (ref >60)
Glucose, Bld: 148 mg/dL — ABNORMAL HIGH (ref 70–99)
Potassium: 4.3 mmol/L (ref 3.5–5.1)
Sodium: 135 mmol/L (ref 135–145)
Total Bilirubin: 2 mg/dL — ABNORMAL HIGH (ref 0.3–1.2)
Total Protein: 7.4 g/dL (ref 6.5–8.1)

## 2021-02-06 LAB — RETICULOCYTES
Immature Retic Fract: 31.6 % — ABNORMAL HIGH (ref 2.3–15.9)
RBC.: 1.77 MIL/uL — ABNORMAL LOW (ref 4.22–5.81)
Retic Count, Absolute: 194.9 10*3/uL — ABNORMAL HIGH (ref 19.0–186.0)
Retic Ct Pct: 11 % — ABNORMAL HIGH (ref 0.4–3.1)

## 2021-02-06 LAB — RESP PANEL BY RT-PCR (FLU A&B, COVID) ARPGX2
Influenza A by PCR: NEGATIVE
Influenza B by PCR: NEGATIVE
SARS Coronavirus 2 by RT PCR: NEGATIVE

## 2021-02-06 LAB — LIPASE, BLOOD: Lipase: 25 U/L (ref 11–51)

## 2021-02-06 MED ORDER — HYDROMORPHONE HCL 1 MG/ML IJ SOLN
1.0000 mg | Freq: Once | INTRAMUSCULAR | Status: AC
  Administered 2021-02-06: 16:00:00 1 mg via INTRAVENOUS
  Filled 2021-02-06: qty 1

## 2021-02-06 MED ORDER — ONDANSETRON HCL 4 MG PO TABS: 4.0000 mg | ORAL_TABLET | Freq: Four times a day (QID) | ORAL | Status: DC | PRN

## 2021-02-06 MED ORDER — ACETAMINOPHEN 325 MG PO TABS
650.0000 mg | ORAL_TABLET | Freq: Four times a day (QID) | ORAL | Status: DC | PRN
  Administered 2021-02-09: 19:00:00 650 mg via ORAL
  Filled 2021-02-06: qty 2

## 2021-02-06 MED ORDER — DIPHENHYDRAMINE HCL 25 MG PO CAPS
25.0000 mg | ORAL_CAPSULE | ORAL | Status: DC | PRN
  Administered 2021-02-11: 05:00:00 25 mg via ORAL
  Filled 2021-02-06: qty 1

## 2021-02-06 MED ORDER — HYDROMORPHONE 1 MG/ML IV SOLN
INTRAVENOUS | Status: DC
  Administered 2021-02-06: 30 mg via INTRAVENOUS
  Administered 2021-02-07: 0.8 mL via INTRAVENOUS
  Administered 2021-02-07: 2.2 mL via INTRAVENOUS
  Administered 2021-02-07: 1.2 mg via INTRAVENOUS
  Administered 2021-02-07 – 2021-02-08 (×2): 0.4 mg via INTRAVENOUS
  Administered 2021-02-08: 2.2 mg via INTRAVENOUS
  Administered 2021-02-09: 1.4 mg via INTRAVENOUS
  Administered 2021-02-09: 2 mg via INTRAVENOUS
  Administered 2021-02-09: 0.8 mg via INTRAVENOUS
  Administered 2021-02-10: 1 mg via INTRAVENOUS
  Administered 2021-02-10: 1.2 mg via INTRAVENOUS
  Filled 2021-02-06 (×2): qty 30

## 2021-02-06 MED ORDER — DEXTROSE-NACL 5-0.45 % IV SOLN: INTRAVENOUS | Status: DC

## 2021-02-06 MED ORDER — ONDANSETRON HCL 4 MG/2ML IJ SOLN
4.0000 mg | Freq: Once | INTRAMUSCULAR | Status: AC
  Administered 2021-02-06: 16:00:00 4 mg via INTRAVENOUS
  Filled 2021-02-06: qty 2

## 2021-02-06 MED ORDER — ENOXAPARIN SODIUM 40 MG/0.4ML IJ SOSY: 40.0000 mg | PREFILLED_SYRINGE | INTRAMUSCULAR | Status: DC

## 2021-02-06 MED ORDER — POLYETHYLENE GLYCOL 3350 17 G PO PACK: 17.0000 g | PACK | Freq: Every day | ORAL | Status: DC | PRN

## 2021-02-06 MED ORDER — VOXELOTOR 500 MG PO TABS
1500.0000 mg | ORAL_TABLET | Freq: Every day | ORAL | Status: DC
  Administered 2021-02-11: 10:00:00 1500 mg via ORAL

## 2021-02-06 MED ORDER — ACETAMINOPHEN 650 MG RE SUPP: 650.0000 mg | Freq: Four times a day (QID) | RECTAL | Status: DC | PRN

## 2021-02-06 MED ORDER — FOLIC ACID 1 MG PO TABS
1.0000 mg | ORAL_TABLET | Freq: Every day | ORAL | Status: DC
  Administered 2021-02-07 – 2021-02-11 (×5): 1 mg via ORAL
  Filled 2021-02-06 (×5): qty 1

## 2021-02-06 MED ORDER — HYDROXYUREA 500 MG PO CAPS
500.0000 mg | ORAL_CAPSULE | Freq: Every day | ORAL | Status: DC
  Administered 2021-02-07 – 2021-02-11 (×5): 500 mg via ORAL
  Filled 2021-02-06 (×5): qty 1

## 2021-02-06 MED ORDER — SODIUM BICARBONATE 650 MG PO TABS
1950.0000 mg | ORAL_TABLET | Freq: Three times a day (TID) | ORAL | Status: DC
  Administered 2021-02-06 – 2021-02-11 (×14): 1950 mg via ORAL
  Filled 2021-02-06 (×14): qty 3

## 2021-02-06 MED ORDER — NALOXONE HCL 0.4 MG/ML IJ SOLN: 0.4000 mg | INTRAMUSCULAR | Status: DC | PRN

## 2021-02-06 MED ORDER — SODIUM CHLORIDE 0.9% FLUSH: 9.0000 mL | INTRAVENOUS | Status: DC | PRN

## 2021-02-06 MED ORDER — HYDROMORPHONE HCL 1 MG/ML IJ SOLN
1.0000 mg | Freq: Once | INTRAMUSCULAR | Status: AC
  Administered 2021-02-06: 15:00:00 1 mg via INTRAVENOUS
  Filled 2021-02-06: qty 1

## 2021-02-06 MED ORDER — CALCITRIOL 0.25 MCG PO CAPS
0.2500 ug | ORAL_CAPSULE | Freq: Every day | ORAL | Status: DC
  Administered 2021-02-07 – 2021-02-11 (×5): 0.25 ug via ORAL
  Filled 2021-02-06 (×5): qty 1

## 2021-02-06 MED ORDER — ONDANSETRON HCL 4 MG/2ML IJ SOLN
4.0000 mg | INTRAMUSCULAR | Status: DC | PRN
  Administered 2021-02-07: 17:00:00 4 mg via INTRAVENOUS
  Filled 2021-02-06: qty 2

## 2021-02-06 MED ORDER — SODIUM CHLORIDE 0.45 % IV BOLUS
1000.0000 mL | Freq: Once | INTRAVENOUS | Status: AC
  Administered 2021-02-06: 18:00:00 1000 mL via INTRAVENOUS

## 2021-02-06 MED ORDER — ONDANSETRON HCL 4 MG/2ML IJ SOLN: 4.0000 mg | Freq: Four times a day (QID) | INTRAMUSCULAR | Status: DC | PRN

## 2021-02-06 MED ORDER — SODIUM ZIRCONIUM CYCLOSILICATE 10 G PO PACK
10.0000 g | PACK | ORAL | Status: DC
  Administered 2021-02-08 – 2021-02-10 (×2): 10 g via ORAL
  Filled 2021-02-06 (×2): qty 1

## 2021-02-06 MED ORDER — SODIUM CHLORIDE 0.9 % IV SOLN
25.0000 mg | INTRAVENOUS | Status: DC | PRN
  Filled 2021-02-06: qty 0.5

## 2021-02-06 MED ORDER — SENNOSIDES-DOCUSATE SODIUM 8.6-50 MG PO TABS
1.0000 | ORAL_TABLET | Freq: Two times a day (BID) | ORAL | Status: DC
  Administered 2021-02-08 – 2021-02-11 (×3): 1 via ORAL
  Filled 2021-02-06 (×8): qty 1

## 2021-02-06 MED ORDER — HYDROMORPHONE HCL 1 MG/ML IJ SOLN
1.0000 mg | INTRAMUSCULAR | Status: DC | PRN
  Administered 2021-02-06: 18:00:00 1 mg via INTRAVENOUS
  Filled 2021-02-06: qty 1

## 2021-02-06 MED ORDER — SODIUM CHLORIDE 0.9 % IV BOLUS
1000.0000 mL | Freq: Once | INTRAVENOUS | Status: AC
  Administered 2021-02-06: 15:00:00 1000 mL via INTRAVENOUS

## 2021-02-06 MED ORDER — DEFERIPRONE (TWICE DAILY) 1000 MG PO TABS
1000.0000 mg | ORAL_TABLET | Freq: Two times a day (BID) | ORAL | Status: DC
  Administered 2021-02-11: 10:00:00 1000 mg via ORAL

## 2021-02-06 MED ORDER — ONDANSETRON HCL 4 MG PO TABS
4.0000 mg | ORAL_TABLET | ORAL | Status: DC | PRN
  Administered 2021-02-07 – 2021-02-08 (×2): 4 mg via ORAL
  Filled 2021-02-06: qty 1

## 2021-02-06 MED ORDER — HYDROMORPHONE HCL 1 MG/ML IJ SOLN
1.0000 mg | Freq: Once | INTRAMUSCULAR | Status: AC
  Administered 2021-02-06: 17:00:00 1 mg via INTRAVENOUS
  Filled 2021-02-06: qty 1

## 2021-02-06 MED ORDER — ENOXAPARIN SODIUM 40 MG/0.4ML IJ SOSY
40.0000 mg | PREFILLED_SYRINGE | INTRAMUSCULAR | Status: DC
  Administered 2021-02-06: 21:00:00 40 mg via SUBCUTANEOUS
  Filled 2021-02-06: qty 0.4

## 2021-02-06 MED ORDER — ONDANSETRON HCL 4 MG/2ML IJ SOLN
4.0000 mg | Freq: Once | INTRAMUSCULAR | Status: AC
  Administered 2021-02-06: 15:00:00 4 mg via INTRAVENOUS
  Filled 2021-02-06: qty 2
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
## 2021-02-06 NOTE — ED Triage Notes (Signed)
Patient complains of sickle cell pain crisis, also states his kidney doctor called and told him his kidney function was bad, was unable to get ahold of his [facility] MD. Has been vomiting as well. Denies fevers.

## 2021-02-06 NOTE — H&P (Signed)
History and Physical    Marq A [employer]. OXB:[id number] DOB: [birth year] DOA: 02/06/2021  PCP: [name], FNP   Patient coming from: Home.  I have personally briefly reviewed patient's old medical records in [place]  Chief Complaint: Sickle cell pain crisis.  HPI: [name] [name]. is a 40 y.o. 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  No fever, chills, rhinorrhea, sore throat, wheezing or hemoptysis.  He feels mildly dyspneic.  No chest pain, palpitations, diaphoresis, PND, orthopnea or pitting edema of the lower extremities.  Denied abdominal pain, nausea, emesis, diarrhea, melena or hematochezia.  He sometimes gets constipated.  No dysuria, frequency or hematuria.  No polyuria, polydipsia, polyphagia or blurred vision.  ED Course: Initial vital signs were temperature 98.4 F, pulse 126, respiration 18, BP 157/91 mmHg and O2 sat 100% on room air.  The patient received a 1000 mL NS bolus, Zofran 4 mg IVP x2 and hydromorphone 1 mg IVP x2.  Lab work: CBC showed a white count of 19.7 with 45% neutrophils and 44% lymphocytes.  Hemoglobin was 7.1 g/dL and platelets 209.  Reticulocyte count was 11%.  CMP showed normal electrolytes when calcium is corrected to albumin.  Glucose 148, BUN 34, creatinine 3.81 and total bilirubin 2.0 mg/dL.  Total protein 7.4 and albumin 3.4 g/dL.  AST 66, ALT 54 and alkaline phosphatase 250 units/L.  Lipase was normal.  Influenza and coronavirus PCR was negative.  Blood cultures x2 were drawn.  Imaging: 2  view chest radiograph did not show any acute cardiopulmonary disease.  There was peribronchial thickening due to chronic bronchitis and/or smoking.  Please see images and full radiology report for further details.  Review of Systems: As per HPI otherwise all other systems reviewed and are negative.  Past Medical History:  Diagnosis Date   Abscess of right iliac fossa 09/24/2018   required Perc Drain 09/24/2018   Arachnoid Cyst of brain bilaterally    "2 really small ones in the back of my head; inside; saw them w/MRI" (09/25/2012)   Bacterial pneumonia ~ 2012   "caught it here in the hospital" (09/25/2012)   Chronic kidney disease    "from my sickle cell" (09/25/2012)   CKD (chronic kidney disease), stage II    Colitis 04/19/2017   CT scan abd/ pelvis   GERD (gastroesophageal reflux disease)    "after I eat alot of spicey foods" (09/25/2012)   Gynecomastia, male 07/10/2012   History of blood transfusion    "always related to sickle cell crisis" (09/25/2012)   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   Noted in noted from Hematology notes at [facility]    "take RX qd to prevent them" (09/25/2012)   Opioid dependence with withdrawal ([place]) 08/30/2012   Renal insufficiency    Sickle cell anemia (HCC)    Sickle cell crisis ([place]) 09/25/2012   Sickle cell nephropathy ([place]) 07/10/2012   Sinus tachycardia    Tachycardia with heart rate 121-140 beats per minute with ambulation 08/04/2016   Past Surgical History:  Procedure Laterality Date   ABCESS DRAINAGE     CHOLECYSTECTOMY  ~  2012   COLONOSCOPY N/A 04/23/2017   Procedure: COLONOSCOPY;  Surgeon: Irene Shipper, MD;  Location: [facility];  Service: Endoscopy;  Laterality: N/A;   IR FLUORO GUIDE CV LINE RIGHT  12/17/2016   IR RADIOLOGIST EVAL & MGMT  10/02/2018   IR RADIOLOGIST EVAL & MGMT  10/15/2018   IR REMOVAL TUN CV CATH W/O FL  12/21/2016   IR US GUIDE VASC ACCESS RIGHT  12/17/2016   spleenectomy     Social History   reports that he has been smoking cigarettes. He has been smoking an average of .1 packs per day. He has never used smokeless tobacco. He reports current alcohol use. He reports current drug use. Drug: Marijuana.  Allergies  Allergen Reactions   Nsaids Other (See Comments)    Kidney disease   Morphine And Related Other (See Comments)    "Real bad headaches"    Family History  Problem Relation Age of Onset   Breast cancer Mother    Prior to Admission medications   Medication Sig Start Date End Date Taking? Authorizing Provider  acetaminophen (TYLENOL) 650 MG CR tablet Take 650 mg by mouth every 8 (eight) hours as needed for pain.   Yes [provider]  calcitRIOL (ROCALTROL) 0.25 MCG capsule Take 0.25 mcg by mouth daily. 11/16/20  Yes [provider]  Deferiprone, Twice Daily, (FERRIPROX TWICE-A-DAY) 1000 MG TABS Take 1,000 mg by mouth 2 (two) times daily.   Yes [provider]  folic acid (FOLVITE) 1 MG tablet TAKE 1 TABLET BY MOUTH EVERY DAY Patient taking differently: Take 1 mg by mouth daily. 03/22/20  Yes [name], FNP  hydroxyurea (HYDREA) 500 MG capsule Take 500 mg by mouth daily. 05/07/20  Yes [provider]  LOKELMA 10 g PACK packet Take 10 g by mouth every other day. 03/18/20  Yes [provider]  nortriptyline (PAMELOR) 25 MG capsule Take 25 mg by mouth at bedtime as needed for sleep. 09/07/20  Yes [provider]  OXBRYTA 500 MG TABS tablet Take 1,500 mg by mouth daily. 03/26/20  Yes [provider]  oxyCODONE (OXYCONTIN) 15 mg 12 hr tablet Take 1 tablet (15 mg total) by mouth every 12 (twelve) hours. 01/14/21 02/17/21 Yes [name], FNP  Oxycodone HCl 10 MG TABS Take 1 tablet (10 mg total) by mouth every 6 (six) hours as needed for up to 15 days. 02/02/21 02/17/21 Yes [name], MD  sodium bicarbonate 650 MG tablet Take 1,950 mg by mouth 3 (three) times daily. 11/07/18  Yes [provider]   melatonin 3 MG TABS tablet Take 1 tablet (3 mg total) by mouth at bedtime. Patient not taking: Reported on 02/06/2021 12/10/20   [name], MD   Physical Exam: Vitals:   02/06/21 1500 02/06/21 1530 02/06/21 1600 02/06/21 1630  BP: 125/83 127/87 124/85 119/81  Pulse: (!) 108 (!) 105 99 96  Resp: 11 10 11 13   Temp:      TempSrc:      SpO2: 97% 96% 95% 95%   Constitutional: NAD, calm, comfortable Eyes: PERRL, lids and conjunctivae normal.  Positive scleral icterus. ENMT: Mucous membranes are mildly dry.  Posterior pharynx clear of any exudate or lesions. Neck: normal, supple, no masses, no thyromegaly Respiratory: clear to auscultation bilaterally, no wheezing, no crackles. Normal respiratory effort. No accessory muscle use.  Cardiovascular: Tachycardic in the low 100s, no murmurs / rubs / gallops. No extremity edema. 2+ pedal pulses. No  carotid bruits.  Abdomen: No distention.  Soft, no tenderness, no masses palpated. No hepatosplenomegaly. Bowel sounds positive.  Musculoskeletal: Mild generalized weakness.  No clubbing / cyanosis. Good ROM, no contractures. Normal muscle tone.  Skin: no acute rashes, lesions, ulcers on limited dermatological examination. Neurologic: CN 2-12 grossly intact. Sensation intact, DTR normal. Strength 5/5 in all 4.  Psychiatric: Normal judgment and insight. Alert and oriented x 3. Normal mood.   Labs on Admission: I have personally reviewed following labs and imaging studies  CBC: Recent Labs  Lab 02/06/21 1501  WBC 19.7*  NEUTROABS 8.8*  HGB 7.1*  HCT 20.3*  MCV 111.5*  PLT 628    Basic Metabolic Panel: Recent Labs  Lab 02/06/21 1501  NA 135  K 4.3  CL 101  CO2 25  GLUCOSE 148*  BUN 34*  CREATININE 3.81*  CALCIUM 8.8*    GFR: CrCl cannot be calculated (Unknown ideal weight.).  Liver Function Tests: Recent Labs  Lab 02/06/21 1501  AST 66*  ALT 54*  ALKPHOS 250*  BILITOT 2.0*  PROT 7.4  ALBUMIN 3.4*    Radiological Exams on Admission: DG Chest 2 View  Result Date: 02/06/2021 CLINICAL DATA:  Shortness of breath.  Sickle cell crisis. 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 Multiple infarcts involving bilateral ribs. Cholecystectomy. Midline trachea. Normal heart size and mediastinal contours. No pleural effusion or pneumothorax. Similar interstitial coarsening which is likely related to smoking/chronic bronchitis. No lobar consolidation. IMPRESSION: No acute cardiopulmonary disease. Peribronchial thickening which may relate to chronic bronchitis or smoking. Electronically Signed   By: Abigail Miyamoto M.D.   On: 02/06/2021 14:57    EKG: Independently reviewed.  Vent. rate 119 BPM PR interval 155 ms QRS duration 82 ms QT/QTcB 320/451 ms P-R-T axes 75 63 71 Sinus tachycardia  Assessment/Plan Principal Problem:   Sickle cell pain crisis (HCC) Observation/telemetry. Continue IV fluids. Analgesics as needed. Begin PCA once on sickle cell floor. Antiemetics as needed. Antihistamines as needed. Sickle cell pain will be following in the morning.  Active Problems:   Acute kidney injury superimposed    on stage IV chronic kidney disease ([place]) Continue IV hydration. Monitor intake and output. Avoid hypotension. Follow-up renal function electrolytes.    Transaminitis The setting of sickle cell pain crisis. Follow-up LFTs in AM.    GERD (gastroesophageal reflux disease) Protonix 40 mg p.o. daily.    TOBACCO ABUSE Nicotine replacement therapy as needed.    DVT prophylaxis: Lovenox SQ. Code Status:   Full code. Family Communication:   Disposition Plan:   Patient is from:  Home.  Anticipated DC to:  Home.  Anticipated DC date:  02/08/2021.  Anticipated DC barriers: Clinical status.  Consults called:   Admission status:  Observation/telemetry.   Severity of Illness: High severity in the setting of acute  sickle cell pain crisis with volume depletion and AKI.  Patient will be admitted for rehydration and pain control.  Reubin Milan MD [facility]  How to contact the [facility] Attending or Consulting provider [place] or covering provider during after hours [place], for this patient?   Check the care team in [facility] and look for a) attending/consulting [facility] provider listed and b) the [facility] Acute Care team listed Log into [email or web address] and use [place]'s universal password to access. If you do not have the password, please contact the hospital operator. Locate the [facility] provider you are looking for under [facility] and page to a number that you can be directly reached. If you still have difficulty reaching the provider, please page the [facility] (Director on Call) for the Hospitalists listed on amion for assistance.  02/06/2021, 5:10 PM   This document was prepared using Dragon voice recognition software and may contain some unintended transcription errors.

## 2021-02-07 ENCOUNTER — Inpatient Hospital Stay (HOSPITAL_COMMUNITY): Payer: Medicare Other

## 2021-02-07 DIAGNOSIS — F17210 Nicotine dependence, cigarettes, uncomplicated: Secondary | ICD-10-CM | POA: Diagnosis present

## 2021-02-07 DIAGNOSIS — D638 Anemia in other chronic diseases classified elsewhere: Secondary | ICD-10-CM | POA: Diagnosis present

## 2021-02-07 DIAGNOSIS — E869 Volume depletion, unspecified: Secondary | ICD-10-CM | POA: Diagnosis present

## 2021-02-07 DIAGNOSIS — N08 Glomerular disorders in diseases classified elsewhere: Secondary | ICD-10-CM | POA: Diagnosis present

## 2021-02-07 DIAGNOSIS — D5700 Hb-SS disease with crisis, unspecified: Secondary | ICD-10-CM | POA: Diagnosis present

## 2021-02-07 DIAGNOSIS — Z20822 Contact with and (suspected) exposure to covid-19: Secondary | ICD-10-CM | POA: Diagnosis present

## 2021-02-07 DIAGNOSIS — N184 Chronic kidney disease, stage 4 (severe): Secondary | ICD-10-CM | POA: Diagnosis present

## 2021-02-07 DIAGNOSIS — J42 Unspecified chronic bronchitis: Secondary | ICD-10-CM | POA: Diagnosis present

## 2021-02-07 DIAGNOSIS — N179 Acute kidney failure, unspecified: Secondary | ICD-10-CM | POA: Diagnosis present

## 2021-02-07 DIAGNOSIS — G43909 Migraine, unspecified, not intractable, without status migrainosus: Secondary | ICD-10-CM | POA: Diagnosis present

## 2021-02-07 DIAGNOSIS — G894 Chronic pain syndrome: Secondary | ICD-10-CM | POA: Diagnosis present

## 2021-02-07 DIAGNOSIS — Z803 Family history of malignant neoplasm of breast: Secondary | ICD-10-CM | POA: Diagnosis not present

## 2021-02-07 DIAGNOSIS — K219 Gastro-esophageal reflux disease without esophagitis: Secondary | ICD-10-CM | POA: Diagnosis present

## 2021-02-07 DIAGNOSIS — N058 Unspecified nephritic syndrome with other morphologic changes: Secondary | ICD-10-CM | POA: Diagnosis present

## 2021-02-07 HISTORY — PX: IR US GUIDE VASC ACCESS RIGHT: IMG2390

## 2021-02-07 HISTORY — PX: IR FLUORO GUIDE CV LINE RIGHT: IMG2283

## 2021-02-07 LAB — URINALYSIS, ROUTINE W REFLEX MICROSCOPIC
Bacteria, UA: NONE SEEN
Bilirubin Urine: NEGATIVE
Glucose, UA: NEGATIVE mg/dL
Ketones, ur: NEGATIVE mg/dL
Leukocytes,Ua: NEGATIVE
Nitrite: NEGATIVE
Protein, ur: 100 mg/dL — AB
Specific Gravity, Urine: 1.01 (ref 1.005–1.030)
pH: 8 (ref 5.0–8.0)

## 2021-02-07 LAB — RAPID URINE DRUG SCREEN, HOSP PERFORMED
Amphetamines: NOT DETECTED
Barbiturates: NOT DETECTED
Benzodiazepines: NOT DETECTED
Cocaine: NOT DETECTED
Opiates: POSITIVE — AB
Tetrahydrocannabinol: POSITIVE — AB

## 2021-02-07 MED ORDER — OXYCODONE HCL ER 15 MG PO T12A
15.0000 mg | EXTENDED_RELEASE_TABLET | Freq: Two times a day (BID) | ORAL | Status: DC
  Administered 2021-02-07 – 2021-02-11 (×9): 15 mg via ORAL
  Filled 2021-02-07 (×9): qty 1

## 2021-02-07 MED ORDER — HEPARIN SODIUM (PORCINE) 1000 UNIT/ML IJ SOLN
INTRAMUSCULAR | Status: AC
  Filled 2021-02-07: qty 10

## 2021-02-07 MED ORDER — ENOXAPARIN SODIUM 30 MG/0.3ML IJ SOSY
30.0000 mg | PREFILLED_SYRINGE | INTRAMUSCULAR | Status: DC
  Filled 2021-02-07 (×2): qty 0.3

## 2021-02-07 MED ORDER — OXYCODONE HCL 5 MG PO TABS
10.0000 mg | ORAL_TABLET | ORAL | Status: DC | PRN
  Administered 2021-02-07 – 2021-02-10 (×9): 10 mg via ORAL
  Filled 2021-02-07 (×9): qty 2

## 2021-02-07 MED ORDER — LIDOCAINE HCL 1 % IJ SOLN
INTRAMUSCULAR | Status: AC
  Administered 2021-02-07: 14:00:00 10 mL
  Filled 2021-02-07: qty 20

## 2021-02-07 NOTE — Procedures (Signed)
Interventional Radiology Procedure Note  Procedure: RT IJ TUNNELED SL POWER PICC    Complications: None  Estimated Blood Loss:  0  Findings: TIP SVCRA    Tamera Punt, MD

## 2021-02-07 NOTE — Progress Notes (Signed)
Subjective: [name] [name] is a 40 year old male with a medical history significant for sickle cell disease, chronic pain syndrome, opiate dependence and tolerance, history of stage IV chronic kidney disease, history of anemia of chronic disease, and history of polysubstance abuse was admitted for sickle cell pain crisis. Patient continues to have pain primarily to his lower extremities and low back.  He rates his pain as 10/10.  Patient currently does not have IV access, he has had very poor venous access over the past years.  Patient does not have a Port-A-Cath in place.  Also, patient has worsening renal insufficiency.  Creatinine is currently 3.8, which is slightly above his baseline.  Patient states that he was evaluated by his nephrologist on last week and his creatinine at that time was 3.5.  He was advised to follow-up with his PCP. He endorses fatigue and some dizziness when transitioning from sitting to standing.  He denies any headache, chest pain, shortness of breath, urinary symptoms, nausea, vomiting, or diarrhea.  Objective:  Vital signs in last 24 hours:  Vitals:   02/07/21 0400 02/07/21 0437 02/07/21 0800 02/07/21 0933  BP:  102/66  109/74  Pulse:  95  86  Resp: 16 16 12 16   Temp:  98.5 F (36.9 C)  97.9 F (36.6 C)  TempSrc:  Oral    SpO2: 100% 95% 95% 90%  Weight:      Height:        Intake/Output from previous day:   Intake/Output Summary (Last 24 hours) at 02/07/2021 1121 Last data filed at 02/07/2021 0948 Gross per 24 hour  Intake 2812.82 ml  Output 1500 ml  Net 1312.82 ml    Physical Exam: General: Alert, awake, oriented x3, in no acute distress.  HEENT: [place]/AT PEERL, EOMI Neck: Trachea midline,  no masses, no thyromegal,y no JVD, no carotid bruit OROPHARYNX:  Moist, No exudate/ erythema/lesions.  Heart: Regular rate and rhythm, without murmurs, rubs, gallops, PMI non-displaced, no heaves or thrills on palpation.  Lungs: Clear to auscultation, no  wheezing or rhonchi noted. No increased vocal fremitus resonant to percussion  Abdomen: Soft, nontender, nondistended, positive bowel sounds, no masses no hepatosplenomegaly noted..  Neuro: No focal neurological deficits noted cranial nerves II through XII grossly intact. DTRs 2+ bilaterally upper and lower extremities. Strength 5 out of 5 in bilateral upper and lower extremities. Musculoskeletal: No warm swelling or erythema around joints, no spinal tenderness noted. Psychiatric: Patient alert and oriented x3, good insight and cognition, good recent to remote recall. Lymph node survey: No cervical axillary or inguinal lymphadenopathy noted.  Lab Results:  Basic Metabolic Panel:    Component Value Date/Time   NA 135 02/06/2021 1501   NA 144 01/11/2021 1048   K 4.3 02/06/2021 1501   CL 101 02/06/2021 1501   CO2 25 02/06/2021 1501   BUN 34 (H) 02/06/2021 1501   BUN 38 (H) 01/11/2021 1048   CREATININE 3.81 (H) 02/06/2021 1501   CREATININE 1.45 (H) 05/12/2014 1430   GLUCOSE 148 (H) 02/06/2021 1501   CALCIUM 8.8 (L) 02/06/2021 1501   CBC:    Component Value Date/Time   WBC 19.7 (H) 02/06/2021 1501   HGB 7.1 (L) 02/06/2021 1501   HGB 8.6 (L) 01/11/2021 1048   HCT 20.3 (L) 02/06/2021 1501   HCT 23.7 (L) 01/11/2021 1048   PLT 209 02/06/2021 1501   PLT 272 01/11/2021 1048   MCV 111.5 (H) 02/06/2021 1501   MCV 110 (H) 01/11/2021 1048   NEUTROABS  8.8 (H) 02/06/2021 1501   NEUTROABS 7.0 01/11/2021 1048   LYMPHSABS 8.6 (H) 02/06/2021 1501   LYMPHSABS 6.4 (H) 01/11/2021 1048   MONOABS 1.7 (H) 02/06/2021 1501   EOSABS 0.5 02/06/2021 1501   EOSABS 0.2 01/11/2021 1048   BASOSABS 0.1 02/06/2021 1501   BASOSABS 0.0 01/11/2021 1048    Recent Results (from the past 240 hour(s))  Resp Panel by RT-PCR (Flu A&B, Covid) Nasopharyngeal Swab     Status: None   Collection Time: 02/06/21  3:15 PM   Specimen: Nasopharyngeal Swab; Nasopharyngeal(NP) swabs in vial transport medium  Result Value  Ref Range Status   SARS Coronavirus 2 by RT PCR NEGATIVE NEGATIVE Final    Comment: (NOTE) SARS-CoV-2 target nucleic acids are NOT DETECTED.  The SARS-CoV-2 RNA is generally detectable in upper respiratory specimens during the acute phase of infection. The lowest concentration of SARS-CoV-2 viral copies this assay can detect is 138 copies/mL. A negative result does not preclude SARS-Cov-2 infection and should not be used as the sole basis for treatment or other patient management decisions. A negative result may occur with  improper specimen collection/handling, submission of specimen other than nasopharyngeal swab, presence of viral mutation(s) within the areas targeted by this assay, and inadequate number of viral copies(<138 copies/mL). A negative result must be combined with clinical observations, patient history, and epidemiological information. The expected result is Negative.  Fact Sheet for Patients:  EntrepreneurPulse.com.au  Fact Sheet for Healthcare Providers:  IncredibleEmployment.be  This test is no t yet approved or cleared by the [place] FDA and  has been authorized for detection and/or diagnosis of SARS-CoV-2 by FDA under an Emergency Use Authorization (EUA). This EUA will remain  in effect (meaning this test can be used) for the duration of the COVID-19 declaration under Section 564(b)(1) of the Act, 21 U.S.C.section 360bbb-3(b)(1), unless the authorization is terminated  or revoked sooner.       Influenza A by PCR NEGATIVE NEGATIVE Final   Influenza B by PCR NEGATIVE NEGATIVE Final    Comment: (NOTE) The Xpert Xpress SARS-CoV-2/FLU/RSV plus assay is intended as an aid in the diagnosis of influenza from Nasopharyngeal swab specimens and should not be used as a sole basis for treatment. Nasal washings and aspirates are unacceptable for Xpert Xpress SARS-CoV-2/FLU/RSV testing.  Fact Sheet for  Patients: EntrepreneurPulse.com.au  Fact Sheet for Healthcare Providers: IncredibleEmployment.be  This test is not yet approved or cleared by the [place] FDA and has been authorized for detection and/or diagnosis of SARS-CoV-2 by FDA under an Emergency Use Authorization (EUA). This EUA will remain in effect (meaning this test can be used) for the duration of the COVID-19 declaration under Section 564(b)(1) of the Act, 21 U.S.C. section 360bbb-3(b)(1), unless the authorization is terminated or revoked.  Performed at [facility], [place] [address], [place], [place] [address]   Blood culture (routine x 2)     Status: None (Preliminary result)   Collection Time: 02/06/21  3:45 PM   Specimen: BLOOD  Result Value Ref Range Status   Specimen Description   Final    BLOOD SITE NOT SPECIFIED Performed at [facility] [address] [place], [place] [address]    Special Requests   Final    BOTTLES DRAWN AEROBIC AND ANAEROBIC Blood Culture adequate volume Performed at [facility] [address], [place], [place] [address]    Culture   Final    NO GROWTH < 12 HOURS Performed  at [facility], [place] [address] [place], [place] [address]    Report Status PENDING  Incomplete  Blood culture (routine x 2)     Status: None (Preliminary result)   Collection Time: 02/06/21  4:00 PM   Specimen: BLOOD  Result Value Ref Range Status   Specimen Description   Final    BLOOD RIGHT ANTECUBITAL Performed at [facility] [address] [place] [address] [place], [place] [address]    Special Requests   Final    BOTTLES DRAWN AEROBIC AND ANAEROBIC Blood Culture adequate volume Performed at [facility] [address] [place], [place] [address]    Culture   Final    NO GROWTH < 12 HOURS Performed at [facility] [address], [place], [place] [address]    Report  Status PENDING  Incomplete    Studies/Results: DG Chest 2 View  Result Date: 02/06/2021 CLINICAL DATA:  Shortness of breath.  Sickle cell crisis. 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 Multiple infarcts involving bilateral ribs. Cholecystectomy. Midline trachea. Normal heart size and mediastinal contours. No pleural effusion or pneumothorax. Similar interstitial coarsening which is likely related to smoking/chronic bronchitis. No lobar consolidation. IMPRESSION: No acute cardiopulmonary disease. Peribronchial thickening which may relate to chronic bronchitis or smoking. Electronically Signed   By: Abigail Miyamoto M.D.   On: 02/06/2021 14:57   [place] EKG SITE RITE  Result Date: 02/07/2021 If Site Rite image not attached, placement could not be confirmed due to current cardiac rhythm.   Medications: Scheduled Meds:  calcitRIOL  0.25 mcg Oral Daily   Deferiprone (Twice Daily)  1,000 mg Oral BID   enoxaparin (LOVENOX) injection  30 mg Subcutaneous R67E   folic acid  1 mg Oral Daily   HYDROmorphone   Intravenous Q4H   hydroxyurea  500 mg Oral Daily   oxyCODONE  15 mg Oral Q12H   senna-docusate  1 tablet Oral BID   sodium bicarbonate  1,950 mg Oral TID   [START ON 02/08/2021] sodium zirconium cyclosilicate  10 g Oral QODAY   voxelotor  1,500 mg Oral Daily   Continuous Infusions:  dextrose 5 % and 0.45% NaCl 100 mL/hr at 02/07/21 0130   diphenhydrAMINE     PRN Meds:.acetaminophen **OR** acetaminophen, diphenhydrAMINE **OR** diphenhydrAMINE, naloxone **AND** sodium chloride flush, ondansetron **OR** ondansetron (ZOFRAN) IV, oxyCODONE, polyethylene glycol  Consultants: Interventional radiology  Procedures: Tunneled catheter placement  Antibiotics: none  Assessment/Plan: Principal Problem:   Sickle cell pain crisis ([place]) Active Problems:   TOBACCO ABUSE   Acute kidney injury superimposed on chronic kidney disease  (HCC)   Transaminitis   GERD (gastroesophageal reflux disease)  Sickle cell disease with pain crisis: Patient is complaining of severe pain to low back and lower extremities.  He currently does not have any IV access.  IR has been consulted for tunnel catheter placement.  Will initiate IV Dilaudid PCA when IV access becomes available.  Start patient's home medication regimen.  Oxycodone 20 mg every 12 hours Oxycodone 10 mg every 4 hours as needed for severe breakthrough pain Toradol contraindicated in this patient due to CKD 4  Acute kidney injury superimposed on CKD 4: Yesterday, patient's creatinine was 3.8, which is above his baseline.  Patient's baseline is 2.8-3.0.  Patient is followed closely by nephrology.  Gentle hydration.  Avoid all nephrotoxins.  Follow labs in AM.  Transaminitis: Transaminitis more than likely secondary  to hemolytic crisis.  Continue IV fluids.  Hepatic function panel in a.m.  Anemia of chronic disease: Patient's hemoglobin 7.1 on admission.  His baseline is 7-8 g/dL.  Repeat CBC.  Transfuse 1 unit PRBCs if hemoglobin is less than 7 g/dL.  Patient is difficult to transfuse due to history of alloantibodies   Tobacco abuse: Patient counseled at length on the dangers of smoking especially in the setting of sickle cell disease, he expressed understanding.  Nicotine patches offered, declined.  Poor venous access: IV teen and patient's RN are unable to obtain access.  Due to patient's worsening renal insufficiency, IR has been consulted to place tunneled catheter.  Code Status: Full Code Family Communication: N/A Disposition Plan: Not yet ready for discharge  [place]angs, MSN, FNP-C [facility] [address] [place], [place] [address] [phone]  If 7PM-7AM, please contact night-coverage.  02/07/2021, 11:21 AM  LOS: 0 days

## 2021-02-07 NOTE — Plan of Care (Signed)
  Problem: Education: Goal: Knowledge of General Education information will improve Description: Including pain rating scale, medication(s)/side effects and non-pharmacologic comfort measures Outcome: Progressing   Problem: Activity: Goal: Risk for activity intolerance will decrease Outcome: Progressing   Problem: Pain Managment: Goal: General experience of comfort will improve Outcome: Progressing   

## 2021-02-08 ENCOUNTER — Observation Stay: Payer: Self-pay

## 2021-02-08 LAB — CBC WITH DIFFERENTIAL/PLATELET
Abs Immature Granulocytes: 0.09 10*3/uL — ABNORMAL HIGH (ref 0.00–0.07)
Basophils Absolute: 0.1 10*3/uL (ref 0.0–0.1)
Basophils Relative: 0 %
Eosinophils Absolute: 0.5 10*3/uL (ref 0.0–0.5)
Eosinophils Relative: 2 %
HCT: 17.3 % — ABNORMAL LOW (ref 39.0–52.0)
Hemoglobin: 6 g/dL — CL (ref 13.0–17.0)
Immature Granulocytes: 1 %
Lymphocytes Relative: 48 %
Lymphs Abs: 8.9 10*3/uL — ABNORMAL HIGH (ref 0.7–4.0)
MCH: 39.5 pg — ABNORMAL HIGH (ref 26.0–34.0)
MCHC: 34.7 g/dL (ref 30.0–36.0)
MCV: 113.8 fL — ABNORMAL HIGH (ref 80.0–100.0)
Monocytes Absolute: 1.7 10*3/uL — ABNORMAL HIGH (ref 0.1–1.0)
Monocytes Relative: 9 %
Neutro Abs: 7.5 10*3/uL (ref 1.7–7.7)
Neutrophils Relative %: 40 %
Platelets: 225 10*3/uL (ref 150–400)
RBC: 1.52 MIL/uL — ABNORMAL LOW (ref 4.22–5.81)
RDW: 24.1 % — ABNORMAL HIGH (ref 11.5–15.5)
WBC: 18.7 10*3/uL — ABNORMAL HIGH (ref 4.0–10.5)
nRBC: 0.1 % (ref 0.0–0.2)

## 2021-02-08 LAB — CBC
HCT: 28.9 % — ABNORMAL LOW (ref 39.0–52.0)
Hemoglobin: 10.2 g/dL — ABNORMAL LOW (ref 13.0–17.0)
MCH: 34 pg (ref 26.0–34.0)
MCHC: 35.3 g/dL (ref 30.0–36.0)
MCV: 96.3 fL (ref 80.0–100.0)
Platelets: 255 10*3/uL (ref 150–400)
RBC: 3 MIL/uL — ABNORMAL LOW (ref 4.22–5.81)
RDW: 26.8 % — ABNORMAL HIGH (ref 11.5–15.5)
WBC: 16 10*3/uL — ABNORMAL HIGH (ref 4.0–10.5)
nRBC: 0.2 % (ref 0.0–0.2)

## 2021-02-08 LAB — COMPREHENSIVE METABOLIC PANEL
ALT: 39 U/L (ref 0–44)
AST: 51 U/L — ABNORMAL HIGH (ref 15–41)
Albumin: 2.8 g/dL — ABNORMAL LOW (ref 3.5–5.0)
Alkaline Phosphatase: 202 U/L — ABNORMAL HIGH (ref 38–126)
Anion gap: 7 (ref 5–15)
BUN: 21 mg/dL — ABNORMAL HIGH (ref 6–20)
CO2: 25 mmol/L (ref 22–32)
Calcium: 8.1 mg/dL — ABNORMAL LOW (ref 8.9–10.3)
Chloride: 103 mmol/L (ref 98–111)
Creatinine, Ser: 2.83 mg/dL — ABNORMAL HIGH (ref 0.61–1.24)
GFR, Estimated: 28 mL/min — ABNORMAL LOW (ref >60)
Glucose, Bld: 103 mg/dL — ABNORMAL HIGH (ref 70–99)
Potassium: 4.3 mmol/L (ref 3.5–5.1)
Sodium: 135 mmol/L (ref 135–145)
Total Bilirubin: 1.3 mg/dL — ABNORMAL HIGH (ref 0.3–1.2)
Total Protein: 6.1 g/dL — ABNORMAL LOW (ref 6.5–8.1)

## 2021-02-08 LAB — RETICULOCYTES
Immature Retic Fract: 34.1 % — ABNORMAL HIGH (ref 2.3–15.9)
RBC.: 1.52 MIL/uL — ABNORMAL LOW (ref 4.22–5.81)
Retic Count, Absolute: 217.2 10*3/uL — ABNORMAL HIGH (ref 19.0–186.0)
Retic Ct Pct: 14.3 % — ABNORMAL HIGH (ref 0.4–3.1)

## 2021-02-08 LAB — PREPARE RBC (CROSSMATCH)

## 2021-02-08 MED ORDER — DIPHENHYDRAMINE HCL 50 MG/ML IJ SOLN
25.0000 mg | Freq: Once | INTRAMUSCULAR | Status: AC
  Administered 2021-02-08: 11:00:00 25 mg via INTRAVENOUS
  Filled 2021-02-08: qty 1

## 2021-02-08 MED ORDER — SODIUM CHLORIDE 0.9% IV SOLUTION: Freq: Once | INTRAVENOUS | Status: AC

## 2021-02-08 MED ORDER — ACETAMINOPHEN 325 MG PO TABS
650.0000 mg | ORAL_TABLET | Freq: Once | ORAL | Status: AC
  Administered 2021-02-08: 11:00:00 650 mg via ORAL
  Filled 2021-02-08: qty 2

## 2021-02-08 NOTE — Progress Notes (Signed)
Subjective: [name] [name] is a 40 year old male with a medical history significant for sickle cell disease, chronic pain syndrome, opiate dependence and tolerance, stage IV CKD, immune complex glomerular nephritis, sickle cell nephropathy, and history of polysubstance was admitted for sickle cell pain crisis.  Today, patient's hemoglobin has decreased to 6.0 g/dL.  His baseline is around 7-8 g/dL.  Patient currently denies dizziness, chest pain, shortness of breath, nausea, vomiting, or diarrhea.  His pain intensity is 8/10 primarily to low back and lower extremities.  Patient's creatinine is much improved on today after fluid resuscitation.  Objective:  Vital signs in last 24 hours:  Vitals:   02/08/21 1500 02/08/21 1525 02/08/21 1540 02/08/21 1542  BP: 103/66  123/81   Pulse: 85  95   Resp: 16 16 18 18   Temp: 98.5 F (36.9 C)  98.3 F (36.8 C)   TempSrc: Oral     SpO2: 97%   97%  Weight:      Height:        Intake/Output from previous day:   Intake/Output Summary (Last 24 hours) at 02/08/2021 1714 Last data filed at 02/08/2021 1633 Gross per 24 hour  Intake 1631 ml  Output 2875 ml  Net -1244 ml    Physical Exam: General: Alert, awake, oriented x3, in no acute distress.  HEENT: [place]/AT PEERL, EOMI Neck: Trachea midline,  no masses, no thyromegal,y no JVD, no carotid bruit OROPHARYNX:  Moist, No exudate/ erythema/lesions.  Heart: Regular rate and rhythm, without murmurs, rubs, gallops, PMI non-displaced, no heaves or thrills on palpation.  Lungs: Clear to auscultation, no wheezing or rhonchi noted. No increased vocal fremitus resonant to percussion  Abdomen: Soft, nontender, nondistended, positive bowel sounds, no masses no hepatosplenomegaly noted..  Neuro: No focal neurological deficits noted cranial nerves II through XII grossly intact. DTRs 2+ bilaterally upper and lower extremities. Strength 5 out of 5 in bilateral upper and lower extremities. Musculoskeletal: No warm  swelling or erythema around joints, no spinal tenderness noted. Psychiatric: Patient alert and oriented x3, good insight and cognition, good recent to remote recall. Lymph node survey: No cervical axillary or inguinal lymphadenopathy noted.  Lab Results:  Basic Metabolic Panel:    Component Value Date/Time   NA 135 02/08/2021 0505   NA 144 01/11/2021 1048   K 4.3 02/08/2021 0505   CL 103 02/08/2021 0505   CO2 25 02/08/2021 0505   BUN 21 (H) 02/08/2021 0505   BUN 38 (H) 01/11/2021 1048   CREATININE 2.83 (H) 02/08/2021 0505   CREATININE 1.45 (H) 05/12/2014 1430   GLUCOSE 103 (H) 02/08/2021 0505   CALCIUM 8.1 (L) 02/08/2021 0505   CBC:    Component Value Date/Time   WBC 18.7 (H) 02/08/2021 0640   HGB 6.0 (LL) 02/08/2021 0640   HGB 8.6 (L) 01/11/2021 1048   HCT 17.3 (L) 02/08/2021 0640   HCT 23.7 (L) 01/11/2021 1048   PLT 225 02/08/2021 0640   PLT 272 01/11/2021 1048   MCV 113.8 (H) 02/08/2021 0640   MCV 110 (H) 01/11/2021 1048   NEUTROABS 7.5 02/08/2021 0640   NEUTROABS 7.0 01/11/2021 1048   LYMPHSABS 8.9 (H) 02/08/2021 0640   LYMPHSABS 6.4 (H) 01/11/2021 1048   MONOABS 1.7 (H) 02/08/2021 0640   EOSABS 0.5 02/08/2021 0640   EOSABS 0.2 01/11/2021 1048   BASOSABS 0.1 02/08/2021 0640   BASOSABS 0.0 01/11/2021 1048    Recent Results (from the past 240 hour(s))  Resp Panel by RT-PCR (Flu A&B, Covid) Nasopharyngeal Swab  Status: None   Collection Time: 02/06/21  3:15 PM   Specimen: Nasopharyngeal Swab; Nasopharyngeal(NP) swabs in vial transport medium  Result Value Ref Range Status   SARS Coronavirus 2 by RT PCR NEGATIVE NEGATIVE Final    Comment: (NOTE) SARS-CoV-2 target nucleic acids are NOT DETECTED.  The SARS-CoV-2 RNA is generally detectable in upper respiratory specimens during the acute phase of infection. The lowest concentration of SARS-CoV-2 viral copies this assay can detect is 138 copies/mL. A negative result does not preclude SARS-Cov-2 infection and  should not be used as the sole basis for treatment or other patient management decisions. A negative result may occur with  improper specimen collection/handling, submission of specimen other than nasopharyngeal swab, presence of viral mutation(s) within the areas targeted by this assay, and inadequate number of viral copies(<138 copies/mL). A negative result must be combined with clinical observations, patient history, and epidemiological information. The expected result is Negative.  Fact Sheet for Patients:  EntrepreneurPulse.com.au  Fact Sheet for Healthcare Providers:  IncredibleEmployment.be  This test is no t yet approved or cleared by the [place] FDA and  has been authorized for detection and/or diagnosis of SARS-CoV-2 by FDA under an Emergency Use Authorization (EUA). This EUA will remain  in effect (meaning this test can be used) for the duration of the COVID-19 declaration under Section 564(b)(1) of the Act, 21 U.S.C.section 360bbb-3(b)(1), unless the authorization is terminated  or revoked sooner.       Influenza A by PCR NEGATIVE NEGATIVE Final   Influenza B by PCR NEGATIVE NEGATIVE Final    Comment: (NOTE) The Xpert Xpress SARS-CoV-2/FLU/RSV plus assay is intended as an aid in the diagnosis of influenza from Nasopharyngeal swab specimens and should not be used as a sole basis for treatment. Nasal washings and aspirates are unacceptable for Xpert Xpress SARS-CoV-2/FLU/RSV testing.  Fact Sheet for Patients: EntrepreneurPulse.com.au  Fact Sheet for Healthcare Providers: IncredibleEmployment.be  This test is not yet approved or cleared by the [place] FDA and has been authorized for detection and/or diagnosis of SARS-CoV-2 by FDA under an Emergency Use Authorization (EUA). This EUA will remain in effect (meaning this test can be used) for the duration of the COVID-19 declaration  under Section 564(b)(1) of the Act, 21 U.S.C. section 360bbb-3(b)(1), unless the authorization is terminated or revoked.  Performed at [facility], [place] [name], [place], [place] [address]   Blood culture (routine x 2)     Status: None (Preliminary result)   Collection Time: 02/06/21  3:45 PM   Specimen: BLOOD  Result Value Ref Range Status   Specimen Description   Final    BLOOD SITE NOT SPECIFIED Performed at [facility] [address] [place], [place] [address]    Special Requests   Final    BOTTLES DRAWN AEROBIC AND ANAEROBIC Blood Culture adequate volume Performed at [facility] [address] [place], [place] [address]    Culture   Final    NO GROWTH 2 DAYS Performed at [facility] [address], [place], [place] [address]    Report Status PENDING  Incomplete  Blood culture (routine x 2)     Status: None (Preliminary result)   Collection Time: 02/06/21  4:00 PM   Specimen: BLOOD  Result Value Ref Range Status   Specimen Description   Final    BLOOD RIGHT ANTECUBITAL Performed at [facility] [address] [place], [place] [address]  Special Requests   Final    BOTTLES DRAWN AEROBIC AND ANAEROBIC Blood Culture adequate volume Performed at [facility] [address], [place], [place] [address]    Culture   Final    NO GROWTH 2 DAYS Performed at [facility] [address], [place], [place] [address]    Report Status PENDING  Incomplete    Studies/Results: IR Fluoro Guide CV Line Right  Result Date: 02/07/2021 INDICATION: Sickle cell crisis, access for IV hydration and pain management EXAM: RIGHT IJ SINGLE-LUMEN TUNNELED POWER PICC LINE MEDICATIONS: 1% LIDOCAINE LOCAL ANESTHESIA/SEDATION: None. FLUOROSCOPY TIME:  Fluoroscopy Time: 0 minutes 12 seconds (0 mGy). COMPLICATIONS: None immediate. PROCEDURE: Informed written consent was obtained  from the patient after a thorough discussion of the procedural risks, benefits and alternatives. All questions were addressed. Maximal Sterile Barrier Technique was utilized including caps, mask, sterile gowns, sterile gloves, sterile drape, hand hygiene and skin antiseptic. A timeout was performed prior to the initiation of the procedure. Under sterile conditions and local anesthesia, ultrasound micropuncture access performed of the right internal jugular vein. Images obtained for documentation of the patent right internal jugular vein. Guidewire advanced centrally under fluoroscopy. Peel-away sheath inserted. Measurements obtained for the appropriate length. In the right infraclavicular chest, a subcutaneous tunnel was created under sterile conditions and local anesthesia. The cuff PICC line was tunneled subcutaneously to the entry site and advanced into the SVC RA junction through the peel-away sheath. Position confirmed with fluoroscopy. Images obtained for documentation. Blood aspirated easily followed by saline flushing. External caps applied. Catheter secured with Ethilon suture. Entry site closed with Dermabond the right IJ puncture. Sterile dressing applied. No immediate complication. Patient tolerated the procedure well. IMPRESSION: Successful right IJ single-lumen tunneled power PICC line. Tip SVC RA junction. Ready for use. Electronically Signed   By: Jerilynn Mages.  Shick M.D.   On: 02/07/2021 14:40   IR US Guide Vasc Access Right  Result Date: 02/07/2021 INDICATION: Sickle cell crisis, access for IV hydration and pain management EXAM: RIGHT IJ SINGLE-LUMEN TUNNELED POWER PICC LINE MEDICATIONS: 1% LIDOCAINE LOCAL ANESTHESIA/SEDATION: None. FLUOROSCOPY TIME:  Fluoroscopy Time: 0 minutes 12 seconds (0 mGy). COMPLICATIONS: None immediate. PROCEDURE: Informed written consent was obtained from the patient after a thorough discussion of the procedural risks, benefits and alternatives. All questions were addressed.  Maximal Sterile Barrier Technique was utilized including caps, mask, sterile gowns, sterile gloves, sterile drape, hand hygiene and skin antiseptic. A timeout was performed prior to the initiation of the procedure. Under sterile conditions and local anesthesia, ultrasound micropuncture access performed of the right internal jugular vein. Images obtained for documentation of the patent right internal jugular vein. Guidewire advanced centrally under fluoroscopy. Peel-away sheath inserted. Measurements obtained for the appropriate length. In the right infraclavicular chest, a subcutaneous tunnel was created under sterile conditions and local anesthesia. The cuff PICC line was tunneled subcutaneously to the entry site and advanced into the SVC RA junction through the peel-away sheath. Position confirmed with fluoroscopy. Images obtained for documentation. Blood aspirated easily followed by saline flushing. External caps applied. Catheter secured with Ethilon suture. Entry site closed with Dermabond the right IJ puncture. Sterile dressing applied. No immediate complication. Patient tolerated the procedure well. IMPRESSION: Successful right IJ single-lumen tunneled power PICC line. Tip SVC RA junction. Ready for use. Electronically Signed   By: Jerilynn Mages.  Shick M.D.   On: 02/07/2021 14:40   [place] EKG SITE RITE  Result Date: 02/07/2021 If Site Rite image not  attached, placement could not be confirmed due to current cardiac rhythm.   Medications: Scheduled Meds:  calcitRIOL  0.25 mcg Oral Daily   Deferiprone (Twice Daily)  1,000 mg Oral BID   enoxaparin (LOVENOX) injection  30 mg Subcutaneous K35W   folic acid  1 mg Oral Daily   HYDROmorphone   Intravenous Q4H   hydroxyurea  500 mg Oral Daily   oxyCODONE  15 mg Oral Q12H   senna-docusate  1 tablet Oral BID   sodium bicarbonate  1,950 mg Oral TID   sodium zirconium cyclosilicate  10 g Oral QODAY   voxelotor  1,500 mg Oral Daily   Continuous Infusions:  dextrose 5  % and 0.45% NaCl Stopped (02/08/21 1126)   diphenhydrAMINE     PRN Meds:.acetaminophen **OR** acetaminophen, diphenhydrAMINE **OR** diphenhydrAMINE, naloxone **AND** sodium chloride flush, ondansetron **OR** ondansetron (ZOFRAN) IV, oxyCODONE, polyethylene glycol  Consultants: none  Procedures: none  Antibiotics: none  Assessment/Plan: Principal Problem:   Sickle cell pain crisis ([place]) Active Problems:   TOBACCO ABUSE   Sickle cell crisis ([place])   Acute kidney injury superimposed on chronic kidney disease (HCC)   Transaminitis   GERD (gastroesophageal reflux disease)  Sickle cell disease with pain crisis: Continue IV Dilaudid PCA without any changes today Oxycodone 10 mg every 6 hours for severe breakthrough pain Toradol contraindicated due to CKD 4 IV fluids reduced to 0.45% saline at 50 mL/h  Acute kidney injury superimposed on CKD 4: On admission, patient's creatinine was 3.8.  Today, creatinine is 2.8, which is consistent with patient's baseline.  Continue gentle hydration and avoid all nephrotoxins.  Follow labs in AM.  Chronic pain syndrome: Continue home medications  Transaminitis: More than likely secondary to hemolytic crisis.  Continue IV fluids.  Follow hepatic function panel.  Anemia of chronic disease: Hemoglobin is 6.0 g/dL, which is below his baseline.  Transfused 2 units PRBCs.  Follow labs in AM.  Tobacco abuse: Patient counseled at length on the dangers of smoking especially in the setting of sickle cell disease, he expressed understanding.  Code Status: Full Code Family Communication: N/A Disposition Plan: Not yet ready for discharge   Christopher, MSN, FNP-C Patient [facility] [address] [place], [place] [address] [phone]  If 7PM-7AM, please contact night-coverage.  02/08/2021, 5:14 PM  LOS: 1 day

## 2021-02-09 LAB — CBC WITH DIFFERENTIAL/PLATELET
Abs Immature Granulocytes: 0.12 10*3/uL — ABNORMAL HIGH (ref 0.00–0.07)
Basophils Absolute: 0.1 10*3/uL (ref 0.0–0.1)
Basophils Relative: 0 %
Eosinophils Absolute: 0.5 10*3/uL (ref 0.0–0.5)
Eosinophils Relative: 3 %
HCT: 27.5 % — ABNORMAL LOW (ref 39.0–52.0)
Hemoglobin: 9.7 g/dL — ABNORMAL LOW (ref 13.0–17.0)
Immature Granulocytes: 1 %
Lymphocytes Relative: 37 %
Lymphs Abs: 5.9 10*3/uL — ABNORMAL HIGH (ref 0.7–4.0)
MCH: 34.5 pg — ABNORMAL HIGH (ref 26.0–34.0)
MCHC: 35.3 g/dL (ref 30.0–36.0)
MCV: 97.9 fL (ref 80.0–100.0)
Monocytes Absolute: 1.4 10*3/uL — ABNORMAL HIGH (ref 0.1–1.0)
Monocytes Relative: 9 %
Neutro Abs: 7.9 10*3/uL — ABNORMAL HIGH (ref 1.7–7.7)
Neutrophils Relative %: 50 %
Platelets: 272 10*3/uL (ref 150–400)
RBC: 2.81 MIL/uL — ABNORMAL LOW (ref 4.22–5.81)
RDW: 27.8 % — ABNORMAL HIGH (ref 11.5–15.5)
WBC: 15.7 10*3/uL — ABNORMAL HIGH (ref 4.0–10.5)
nRBC: 0.1 % (ref 0.0–0.2)

## 2021-02-09 LAB — RETICULOCYTES
Immature Retic Fract: 27.4 % — ABNORMAL HIGH (ref 2.3–15.9)
RBC.: 2.79 MIL/uL — ABNORMAL LOW (ref 4.22–5.81)
Retic Count, Absolute: 233 10*3/uL — ABNORMAL HIGH (ref 19.0–186.0)
Retic Ct Pct: 8.4 % — ABNORMAL HIGH (ref 0.4–3.1)

## 2021-02-09 LAB — COMPREHENSIVE METABOLIC PANEL
ALT: 40 U/L (ref 0–44)
AST: 56 U/L — ABNORMAL HIGH (ref 15–41)
Albumin: 2.8 g/dL — ABNORMAL LOW (ref 3.5–5.0)
Alkaline Phosphatase: 190 U/L — ABNORMAL HIGH (ref 38–126)
Anion gap: 3 — ABNORMAL LOW (ref 5–15)
BUN: 26 mg/dL — ABNORMAL HIGH (ref 6–20)
CO2: 24 mmol/L (ref 22–32)
Calcium: 8.4 mg/dL — ABNORMAL LOW (ref 8.9–10.3)
Chloride: 109 mmol/L (ref 98–111)
Creatinine, Ser: 3.13 mg/dL — ABNORMAL HIGH (ref 0.61–1.24)
GFR, Estimated: 25 mL/min — ABNORMAL LOW (ref >60)
Glucose, Bld: 98 mg/dL (ref 70–99)
Potassium: 4.9 mmol/L (ref 3.5–5.1)
Sodium: 136 mmol/L (ref 135–145)
Total Bilirubin: 1.7 mg/dL — ABNORMAL HIGH (ref 0.3–1.2)
Total Protein: 6.1 g/dL — ABNORMAL LOW (ref 6.5–8.1)

## 2021-02-09 LAB — BPAM RBC
Blood Product Expiration Date: 202301162359
Blood Product Expiration Date: 202301212359
ISSUE DATE / TIME: 202212271147
ISSUE DATE / TIME: 202212271518
Unit Type and Rh: 5100
Unit Type and Rh: 9500

## 2021-02-09 LAB — TYPE AND SCREEN
ABO/RH(D): O POS
Antibody Screen: NEGATIVE
Donor AG Type: NEGATIVE
Donor AG Type: NEGATIVE
Unit division: 0
Unit division: 0
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
## 2021-02-09 NOTE — Progress Notes (Signed)
Subjective: [name] [name] is a 40 year old male with a medical history significant for sickle cell disease, chronic pain syndrome, opiate dependence and tolerance, stage IV CKD, immune complex glomerular nephritis, sickle cell nephropathy, and history of polysubstance was admitted for sickle cell pain crisis. Patient continues to complain of bilateral lower extremity and back pain.  He rates his pain as 8/10.  Patient is status post 2 units PRBCs and hemoglobin has improved above baseline. He denies any headache, dizziness, chest pain, shortness of breath, urinary symptoms, nausea, vomiting, or diarrhea.   Objective:  Vital signs in last 24 hours:  Vitals:   02/09/21 0451 02/09/21 0754 02/09/21 0820 02/09/21 1254  BP: 97/60     Pulse: 79 82    Resp: 20 16 15 16   Temp: 98.7 F (37.1 C) 97.8 F (36.6 C)    TempSrc: Oral Oral    SpO2: 94% 93% 96% 96%  Weight:      Height:        Intake/Output from previous day:   Intake/Output Summary (Last 24 hours) at 02/09/2021 1633 Last data filed at 02/09/2021 1626 Gross per 24 hour  Intake 2585.56 ml  Output 1850 ml  Net 735.56 ml    Physical Exam: General: Alert, awake, oriented x3, in no acute distress.  HEENT: [place]/AT PEERL, EOMI Neck: Trachea midline,  no masses, no thyromegal,y no JVD, no carotid bruit OROPHARYNX:  Moist, No exudate/ erythema/lesions.  Heart: Regular rate and rhythm, without murmurs, rubs, gallops, PMI non-displaced, no heaves or thrills on palpation.  Lungs: Clear to auscultation, no wheezing or rhonchi noted. No increased vocal fremitus resonant to percussion  Abdomen: Soft, nontender, nondistended, positive bowel sounds, no masses no hepatosplenomegaly noted..  Neuro: No focal neurological deficits noted cranial nerves II through XII grossly intact. DTRs 2+ bilaterally upper and lower extremities. Strength 5 out of 5 in bilateral upper and lower extremities. Musculoskeletal: No warm swelling or erythema around  joints, no spinal tenderness noted. Psychiatric: Patient alert and oriented x3, good insight and cognition, good recent to remote recall. Lymph node survey: No cervical axillary or inguinal lymphadenopathy noted.  Lab Results:  Basic Metabolic Panel:    Component Value Date/Time   NA 136 02/09/2021 0618   NA 144 01/11/2021 1048   K 4.9 02/09/2021 0618   CL 109 02/09/2021 0618   CO2 24 02/09/2021 0618   BUN 26 (H) 02/09/2021 0618   BUN 38 (H) 01/11/2021 1048   CREATININE 3.13 (H) 02/09/2021 0618   CREATININE 1.45 (H) 05/12/2014 1430   GLUCOSE 98 02/09/2021 0618   CALCIUM 8.4 (L) 02/09/2021 0618   CBC:    Component Value Date/Time   WBC 15.7 (H) 02/09/2021 0618   HGB 9.7 (L) 02/09/2021 0618   HGB 8.6 (L) 01/11/2021 1048   HCT 27.5 (L) 02/09/2021 0618   HCT 23.7 (L) 01/11/2021 1048   PLT 272 02/09/2021 0618   PLT 272 01/11/2021 1048   MCV 97.9 02/09/2021 0618   MCV 110 (H) 01/11/2021 1048   NEUTROABS 7.9 (H) 02/09/2021 0618   NEUTROABS 7.0 01/11/2021 1048   LYMPHSABS 5.9 (H) 02/09/2021 0618   LYMPHSABS 6.4 (H) 01/11/2021 1048   MONOABS 1.4 (H) 02/09/2021 0618   EOSABS 0.5 02/09/2021 0618   EOSABS 0.2 01/11/2021 1048   BASOSABS 0.1 02/09/2021 0618   BASOSABS 0.0 01/11/2021 1048    Recent Results (from the past 240 hour(s))  Resp Panel by RT-PCR (Flu A&B, Covid) Nasopharyngeal Swab     Status: None  Collection Time: 02/06/21  3:15 PM   Specimen: Nasopharyngeal Swab; Nasopharyngeal(NP) swabs in vial transport medium  Result Value Ref Range Status   SARS Coronavirus 2 by RT PCR NEGATIVE NEGATIVE Final    Comment: (NOTE) SARS-CoV-2 target nucleic acids are NOT DETECTED.  The SARS-CoV-2 RNA is generally detectable in upper respiratory specimens during the acute phase of infection. The lowest concentration of SARS-CoV-2 viral copies this assay can detect is 138 copies/mL. A negative result does not preclude SARS-Cov-2 infection and should not be used as the sole basis  for treatment or other patient management decisions. A negative result may occur with  improper specimen collection/handling, submission of specimen other than nasopharyngeal swab, presence of viral mutation(s) within the areas targeted by this assay, and inadequate number of viral copies(<138 copies/mL). A negative result must be combined with clinical observations, patient history, and epidemiological information. The expected result is Negative.  Fact Sheet for Patients:  EntrepreneurPulse.com.au  Fact Sheet for Healthcare Providers:  IncredibleEmployment.be  This test is no t yet approved or cleared by the [place] FDA and  has been authorized for detection and/or diagnosis of SARS-CoV-2 by FDA under an Emergency Use Authorization (EUA). This EUA will remain  in effect (meaning this test can be used) for the duration of the COVID-19 declaration under Section 564(b)(1) of the Act, 21 U.S.C.section 360bbb-3(b)(1), unless the authorization is terminated  or revoked sooner.       Influenza A by PCR NEGATIVE NEGATIVE Final   Influenza B by PCR NEGATIVE NEGATIVE Final    Comment: (NOTE) The Xpert Xpress SARS-CoV-2/FLU/RSV plus assay is intended as an aid in the diagnosis of influenza from Nasopharyngeal swab specimens and should not be used as a sole basis for treatment. Nasal washings and aspirates are unacceptable for Xpert Xpress SARS-CoV-2/FLU/RSV testing.  Fact Sheet for Patients: EntrepreneurPulse.com.au  Fact Sheet for Healthcare Providers: IncredibleEmployment.be  This test is not yet approved or cleared by the [place] FDA and has been authorized for detection and/or diagnosis of SARS-CoV-2 by FDA under an Emergency Use Authorization (EUA). This EUA will remain in effect (meaning this test can be used) for the duration of the COVID-19 declaration under Section 564(b)(1) of the Act, 21  U.S.C. section 360bbb-3(b)(1), unless the authorization is terminated or revoked.  Performed at [facility], [place] [address], [place], [place] [address]   Blood culture (routine x 2)     Status: None (Preliminary result)   Collection Time: 02/06/21  3:45 PM   Specimen: BLOOD  Result Value Ref Range Status   Specimen Description   Final    BLOOD SITE NOT SPECIFIED Performed at [facility] [address] [place], [place] [address]    Special Requests   Final    BOTTLES DRAWN AEROBIC AND ANAEROBIC Blood Culture adequate volume Performed at [facility] [address] [place], [place] [address]    Culture   Final    NO GROWTH 3 DAYS Performed at [facility], [place] [address], [place], [place] [address]    Report Status PENDING  Incomplete  Blood culture (routine x 2)     Status: None (Preliminary result)   Collection Time: 02/06/21  4:00 PM   Specimen: BLOOD  Result Value Ref Range Status   Specimen Description   Final    BLOOD RIGHT ANTECUBITAL Performed at [facility] [address], [place], [place] [address]    Special Requests  Final    BOTTLES DRAWN AEROBIC AND ANAEROBIC Blood Culture adequate volume Performed at [facility] [address], [place], [place] [address]    Culture   Final    NO GROWTH 3 DAYS Performed at [facility], [place] [address], [place], [place] [address]    Report Status PENDING  Incomplete    Studies/Results: No results found.  Medications: Scheduled Meds:  calcitRIOL  0.25 mcg Oral Daily   Deferiprone (Twice Daily)  1,000 mg Oral BID   enoxaparin (LOVENOX) injection  30 mg Subcutaneous L37D   folic acid  1 mg Oral Daily   HYDROmorphone   Intravenous Q4H   hydroxyurea  500 mg Oral Daily   oxyCODONE  15 mg Oral Q12H   senna-docusate  1 tablet Oral BID   sodium bicarbonate  1,950 mg Oral TID   sodium zirconium cyclosilicate   10 g Oral QODAY   voxelotor  1,500 mg Oral Daily   Continuous Infusions:  dextrose 5 % and 0.45% NaCl 50 mL/hr at 02/09/21 0532   diphenhydrAMINE     PRN Meds:.acetaminophen **OR** acetaminophen, diphenhydrAMINE **OR** diphenhydrAMINE, naloxone **AND** sodium chloride flush, ondansetron **OR** ondansetron (ZOFRAN) IV, oxyCODONE, polyethylene glycol  Consultants: none  Procedures: none  Antibiotics: none  Assessment/Plan: Principal Problem:   Sickle cell pain crisis ([place]) Active Problems:   TOBACCO ABUSE   Sickle cell crisis ([place])   Acute kidney injury superimposed on chronic kidney disease (HCC)   Transaminitis   GERD (gastroesophageal reflux disease)  Sickle cell disease with pain crisis: Continue IV Dilaudid PCA without any changes today Oxycodone 10 mg every 6 hours for severe breakthrough pain Toradol contraindicated due to CKD 4 Continue 0.45% saline at 50 mL/h  Acute kidney injury superimposed on CKD 4: Stable.  Continue gentle hydration and avoid all nephrotoxins.  Follow labs in AM.  Chronic pain syndrome: Continue home medications  Transaminitis: More than likely secondary to hemolytic crisis.  Continue IV fluids.  Follow hepatic function panel.  Anemia of chronic disease: Hemoglobin has improved to 9.7 g/dL, which is above patient's baseline.  Patient is status post 2 units PRBCs on yesterday.  Continue to follow closely.  Labs in AM.  Tobacco abuse: Patient counseled at length on the dangers of smoking especially in the setting of sickle cell disease, he expressed understanding.  Code Status: Full Code Family Communication: N/A Disposition Plan: Not yet ready for discharge   Dennis [place], MSN, FNP-C Patient [facility] [address] [place],  [address] [phone]  If 7PM-7AM, please contact night-coverage.  02/09/2021, 4:33 PM  LOS: 2 days

## 2021-02-10 DIAGNOSIS — D5700 Hb-SS disease with crisis, unspecified: Secondary | ICD-10-CM | POA: Diagnosis not present

## 2021-02-10 LAB — CBC WITH DIFFERENTIAL/PLATELET
Abs Immature Granulocytes: 0.07 10*3/uL (ref 0.00–0.07)
Basophils Absolute: 0.1 10*3/uL (ref 0.0–0.1)
Basophils Relative: 0 %
Eosinophils Absolute: 0.4 10*3/uL (ref 0.0–0.5)
Eosinophils Relative: 4 %
HCT: 28.1 % — ABNORMAL LOW (ref 39.0–52.0)
Hemoglobin: 9.9 g/dL — ABNORMAL LOW (ref 13.0–17.0)
Immature Granulocytes: 1 %
Lymphocytes Relative: 41 %
Lymphs Abs: 5.2 10*3/uL — ABNORMAL HIGH (ref 0.7–4.0)
MCH: 34.7 pg — ABNORMAL HIGH (ref 26.0–34.0)
MCHC: 35.2 g/dL (ref 30.0–36.0)
MCV: 98.6 fL (ref 80.0–100.0)
Monocytes Absolute: 1.2 10*3/uL — ABNORMAL HIGH (ref 0.1–1.0)
Monocytes Relative: 10 %
Neutro Abs: 5.7 10*3/uL (ref 1.7–7.7)
Neutrophils Relative %: 44 %
Platelets: 265 10*3/uL (ref 150–400)
RBC: 2.85 MIL/uL — ABNORMAL LOW (ref 4.22–5.81)
RDW: 26.9 % — ABNORMAL HIGH (ref 11.5–15.5)
WBC: 12.7 10*3/uL — ABNORMAL HIGH (ref 4.0–10.5)
nRBC: 0.2 % (ref 0.0–0.2)

## 2021-02-10 LAB — RETICULOCYTES
Immature Retic Fract: 25 % — ABNORMAL HIGH (ref 2.3–15.9)
RBC.: 2.81 MIL/uL — ABNORMAL LOW (ref 4.22–5.81)
Retic Count, Absolute: 193.9 K/uL — ABNORMAL HIGH (ref 19.0–186.0)
Retic Ct Pct: 6.9 % — ABNORMAL HIGH (ref 0.4–3.1)

## 2021-02-10 LAB — COMPREHENSIVE METABOLIC PANEL WITH GFR
ALT: 41 U/L (ref 0–44)
AST: 54 U/L — ABNORMAL HIGH (ref 15–41)
Albumin: 2.7 g/dL — ABNORMAL LOW (ref 3.5–5.0)
Alkaline Phosphatase: 192 U/L — ABNORMAL HIGH (ref 38–126)
Anion gap: 9 (ref 5–15)
BUN: 28 mg/dL — ABNORMAL HIGH (ref 6–20)
CO2: 22 mmol/L (ref 22–32)
Calcium: 8.5 mg/dL — ABNORMAL LOW (ref 8.9–10.3)
Chloride: 106 mmol/L (ref 98–111)
Creatinine, Ser: 2.96 mg/dL — ABNORMAL HIGH (ref 0.61–1.24)
GFR, Estimated: 27 mL/min — ABNORMAL LOW
Glucose, Bld: 93 mg/dL (ref 70–99)
Potassium: 4.9 mmol/L (ref 3.5–5.1)
Sodium: 137 mmol/L (ref 135–145)
Total Bilirubin: 1.6 mg/dL — ABNORMAL HIGH (ref 0.3–1.2)
Total Protein: 6 g/dL — ABNORMAL LOW (ref 6.5–8.1)

## 2021-02-10 MED ORDER — HYDROMORPHONE 1 MG/ML IV SOLN
INTRAVENOUS | Status: DC
  Administered 2021-02-10: 30 mg via INTRAVENOUS
  Administered 2021-02-10: 4.5 mg via INTRAVENOUS
  Administered 2021-02-11: 3 mg via INTRAVENOUS
  Filled 2021-02-10: qty 30

## 2021-02-10 NOTE — Progress Notes (Signed)
Subjective: [name] [name] is a 40 year old male with a medical history significant for sickle cell disease, chronic pain syndrome, opiate dependence and tolerance, stage IV CKD, immune complex glomerular nephritis, sickle cell nephropathy, and history of polysubstance was admitted for sickle cell pain crisis.  Patient continues to complain of bilateral lower extremity and back pain.  He rates his pain as 6/10.  Patient says that he cannot manage at home at this time.  He denies any headache, dizziness, chest pain, shortness of breath, urinary symptoms, nausea, vomiting, or diarrhea.   Objective:  Vital signs in last 24 hours:  Vitals:   02/10/21 0803 02/10/21 0913 02/10/21 1134 02/10/21 1236  BP:  105/66  122/79  Pulse:  67  85  Resp: 14  17 17   Temp:  98.6 F (37 C)  98.1 F (36.7 C)  TempSrc:  Oral  Oral  SpO2: 96% 96% 98% 95%  Weight:      Height:        Intake/Output from previous day:   Intake/Output Summary (Last 24 hours) at 02/10/2021 1400 Last data filed at 02/10/2021 1236 Gross per 24 hour  Intake 1253.61 ml  Output 3125 ml  Net -1871.39 ml    Physical Exam: General: Alert, awake, oriented x3, in no acute distress.  HEENT: [place]/AT PEERL, EOMI Neck: Trachea midline,  no masses, no thyromegal,y no JVD, no carotid bruit OROPHARYNX:  Moist, No exudate/ erythema/lesions.  Heart: Regular rate and rhythm, without murmurs, rubs, gallops, PMI non-displaced, no heaves or thrills on palpation.  Lungs: Clear to auscultation, no wheezing or rhonchi noted. No increased vocal fremitus resonant to percussion  Abdomen: Soft, nontender, nondistended, positive bowel sounds, no masses no hepatosplenomegaly noted..  Neuro: No focal neurological deficits noted cranial nerves II through XII grossly intact. DTRs 2+ bilaterally upper and lower extremities. Strength 5 out of 5 in bilateral upper and lower extremities. Musculoskeletal: No warm swelling or erythema around joints, no spinal  tenderness noted. Psychiatric: Patient alert and oriented x3, good insight and cognition, good recent to remote recall. Lymph node survey: No cervical axillary or inguinal lymphadenopathy noted.  Lab Results:  Basic Metabolic Panel:    Component Value Date/Time   NA 137 02/10/2021 0500   NA 144 01/11/2021 1048   K 4.9 02/10/2021 0500   CL 106 02/10/2021 0500   CO2 22 02/10/2021 0500   BUN 28 (H) 02/10/2021 0500   BUN 38 (H) 01/11/2021 1048   CREATININE 2.96 (H) 02/10/2021 0500   CREATININE 1.45 (H) 05/12/2014 1430   GLUCOSE 93 02/10/2021 0500   CALCIUM 8.5 (L) 02/10/2021 0500   CBC:    Component Value Date/Time   WBC 12.7 (H) 02/10/2021 0500   HGB 9.9 (L) 02/10/2021 0500   HGB 8.6 (L) 01/11/2021 1048   HCT 28.1 (L) 02/10/2021 0500   HCT 23.7 (L) 01/11/2021 1048   PLT 265 02/10/2021 0500   PLT 272 01/11/2021 1048   MCV 98.6 02/10/2021 0500   MCV 110 (H) 01/11/2021 1048   NEUTROABS 5.7 02/10/2021 0500   NEUTROABS 7.0 01/11/2021 1048   LYMPHSABS 5.2 (H) 02/10/2021 0500   LYMPHSABS 6.4 (H) 01/11/2021 1048   MONOABS 1.2 (H) 02/10/2021 0500   EOSABS 0.4 02/10/2021 0500   EOSABS 0.2 01/11/2021 1048   BASOSABS 0.1 02/10/2021 0500   BASOSABS 0.0 01/11/2021 1048    Recent Results (from the past 240 hour(s))  Resp Panel by RT-PCR (Flu A&B, Covid) Nasopharyngeal Swab     Status: None  Collection Time: 02/06/21  3:15 PM   Specimen: Nasopharyngeal Swab; Nasopharyngeal(NP) swabs in vial transport medium  Result Value Ref Range Status   SARS Coronavirus 2 by RT PCR NEGATIVE NEGATIVE Final    Comment: (NOTE) SARS-CoV-2 target nucleic acids are NOT DETECTED.  The SARS-CoV-2 RNA is generally detectable in upper respiratory specimens during the acute phase of infection. The lowest concentration of SARS-CoV-2 viral copies this assay can detect is 138 copies/mL. A negative result does not preclude SARS-Cov-2 infection and should not be used as the sole basis for treatment  or other patient management decisions. A negative result may occur with  improper specimen collection/handling, submission of specimen other than nasopharyngeal swab, presence of viral mutation(s) within the areas targeted by this assay, and inadequate number of viral copies(<138 copies/mL). A negative result must be combined with clinical observations, patient history, and epidemiological information. The expected result is Negative.  Fact Sheet for Patients:  EntrepreneurPulse.com.au  Fact Sheet for Healthcare Providers:  IncredibleEmployment.be  This test is no t yet approved or cleared by the [place] FDA and  has been authorized for detection and/or diagnosis of SARS-CoV-2 by FDA under an Emergency Use Authorization (EUA). This EUA will remain  in effect (meaning this test can be used) for the duration of the COVID-19 declaration under Section 564(b)(1) of the Act, 21 U.S.C.section 360bbb-3(b)(1), unless the authorization is terminated  or revoked sooner.       Influenza A by PCR NEGATIVE NEGATIVE Final   Influenza B by PCR NEGATIVE NEGATIVE Final    Comment: (NOTE) The Xpert Xpress SARS-CoV-2/FLU/RSV plus assay is intended as an aid in the diagnosis of influenza from Nasopharyngeal swab specimens and should not be used as a sole basis for treatment. Nasal washings and aspirates are unacceptable for Xpert Xpress SARS-CoV-2/FLU/RSV testing.  Fact Sheet for Patients: EntrepreneurPulse.com.au  Fact Sheet for Healthcare Providers: IncredibleEmployment.be  This test is not yet approved or cleared by the [place] FDA and has been authorized for detection and/or diagnosis of SARS-CoV-2 by FDA under an Emergency Use Authorization (EUA). This EUA will remain in effect (meaning this test can be used) for the duration of the COVID-19 declaration under Section 564(b)(1) of the Act, 21 U.S.C. section  360bbb-3(b)(1), unless the authorization is terminated or revoked.  Performed at [facility], [place] [address], [place], [place] [address]   Blood culture (routine x 2)     Status: None (Preliminary result)   Collection Time: 02/06/21  3:45 PM   Specimen: BLOOD  Result Value Ref Range Status   Specimen Description   Final    BLOOD SITE NOT SPECIFIED Performed at [facility] [address], [place], [place] [address]    Special Requests   Final    BOTTLES DRAWN AEROBIC AND ANAEROBIC Blood Culture adequate volume Performed at [facility] [address], [place], [place] [address]    Culture   Final    NO GROWTH 4 DAYS Performed at [facility], [place] [address] [place], [place] [address]    Report Status PENDING  Incomplete  Blood culture (routine x 2)     Status: None (Preliminary result)   Collection Time: 02/06/21  4:00 PM   Specimen: BLOOD  Result Value Ref Range Status   Specimen Description   Final    BLOOD RIGHT ANTECUBITAL Performed at [facility] [address] [place],  [address]    Special Requests  Final    BOTTLES DRAWN AEROBIC AND ANAEROBIC Blood Culture adequate volume Performed at [facility] [address], [place], [place] [address]    Culture   Final    NO GROWTH 4 DAYS Performed at [facility], [place] [address], [place], [place] [address]    Report Status PENDING  Incomplete    Studies/Results: No results found.  Medications: Scheduled Meds:  calcitRIOL  0.25 mcg Oral Daily   Deferiprone (Twice Daily)  1,000 mg Oral BID   enoxaparin (LOVENOX) injection  30 mg Subcutaneous L45G   folic acid  1 mg Oral Daily   HYDROmorphone   Intravenous Q4H   hydroxyurea  500 mg Oral Daily   oxyCODONE  15 mg Oral Q12H   senna-docusate  1 tablet Oral BID   sodium bicarbonate  1,950 mg Oral TID   sodium zirconium cyclosilicate  10 g Oral QODAY    voxelotor  1,500 mg Oral Daily   Continuous Infusions:  dextrose 5 % and 0.45% NaCl 50 mL/hr at 02/10/21 0151   diphenhydrAMINE     PRN Meds:.acetaminophen **OR** acetaminophen, diphenhydrAMINE **OR** diphenhydrAMINE, naloxone **AND** sodium chloride flush, ondansetron **OR** ondansetron (ZOFRAN) IV, oxyCODONE, polyethylene glycol  Consultants: none  Procedures: none  Antibiotics: none  Assessment/Plan: Principal Problem:   Sickle cell pain crisis ([place]) Active Problems:   TOBACCO ABUSE   Sickle cell crisis ([place])   Acute kidney injury superimposed on chronic kidney disease (HCC)   Transaminitis   GERD (gastroesophageal reflux disease)  Sickle cell disease with pain crisis: Continue IV Dilaudid PCA, settings increased Oxycodone 10 mg every 6 hours for severe breakthrough pain Toradol contraindicated due to CKD 4 Continue 0.45% saline at 50 mL/h  Acute kidney injury superimposed on CKD 4: Stable.  Continue gentle hydration and avoid all nephrotoxins.  Follow labs in AM.  Chronic pain syndrome: Continue home medications  Transaminitis: More than likely secondary to hemolytic crisis.  Follow hepatic function panel.  Anemia of chronic disease: Hemoglobin has improved to 9.9 g/dL, which is above patient's baseline.  Patient is status post 2 units PRBCs on yesterday.  Continue to follow closely.  Labs in AM.  Tobacco abuse: Patient counseled at length on the dangers of smoking especially in the setting of sickle cell disease, he expressed understanding.  Code Status: Full Code Family Communication: N/A Disposition Plan: Not yet ready for discharge   [place], MSN, FNP-C Patient [facility] [address] [place], [place] [address] [phone]  If 7PM-7AM, please contact night-coverage.  02/10/2021, 2:00 PM  LOS: 3 days

## 2021-02-10 NOTE — Care Management Important Message (Signed)
Important Message  Patient Details IM Letter given to the Patient. Name: [name] [name]. MRN: [id number] Date of Birth: Jun 27, 1980   Medicare Important Message Given:  Yes     Kerin Salen 02/10/2021, 10:18 AM

## 2021-02-11 ENCOUNTER — Inpatient Hospital Stay (HOSPITAL_COMMUNITY): Payer: Medicare Other

## 2021-02-11 DIAGNOSIS — D5700 Hb-SS disease with crisis, unspecified: Secondary | ICD-10-CM | POA: Diagnosis not present

## 2021-02-11 HISTORY — PX: IR REMOVAL TUN CV CATH W/O FL: IMG2289

## 2021-02-11 LAB — CBC WITH DIFFERENTIAL/PLATELET
Abs Immature Granulocytes: 0.1 10*3/uL — ABNORMAL HIGH (ref 0.00–0.07)
Basophils Absolute: 0 10*3/uL (ref 0.0–0.1)
Basophils Relative: 0 %
Eosinophils Absolute: 0.3 10*3/uL (ref 0.0–0.5)
Eosinophils Relative: 2 %
HCT: 28.4 % — ABNORMAL LOW (ref 39.0–52.0)
Hemoglobin: 10 g/dL — ABNORMAL LOW (ref 13.0–17.0)
Immature Granulocytes: 1 %
Lymphocytes Relative: 40 %
Lymphs Abs: 5 10*3/uL — ABNORMAL HIGH (ref 0.7–4.0)
MCH: 35.2 pg — ABNORMAL HIGH (ref 26.0–34.0)
MCHC: 35.2 g/dL (ref 30.0–36.0)
MCV: 100 fL (ref 80.0–100.0)
Monocytes Absolute: 1.4 10*3/uL — ABNORMAL HIGH (ref 0.1–1.0)
Monocytes Relative: 11 %
Neutro Abs: 5.7 10*3/uL (ref 1.7–7.7)
Neutrophils Relative %: 46 %
Platelets: 247 10*3/uL (ref 150–400)
RBC: 2.84 MIL/uL — ABNORMAL LOW (ref 4.22–5.81)
WBC: 12.5 10*3/uL — ABNORMAL HIGH (ref 4.0–10.5)
nRBC: 0.2 % (ref 0.0–0.2)

## 2021-02-11 LAB — BASIC METABOLIC PANEL
Anion gap: 12 (ref 5–15)
BUN: 30 mg/dL — ABNORMAL HIGH (ref 6–20)
CO2: 24 mmol/L (ref 22–32)
Calcium: 8.7 mg/dL — ABNORMAL LOW (ref 8.9–10.3)
Chloride: 102 mmol/L (ref 98–111)
Creatinine, Ser: 2.79 mg/dL — ABNORMAL HIGH (ref 0.61–1.24)
GFR, Estimated: 29 mL/min — ABNORMAL LOW (ref >60)
Glucose, Bld: 115 mg/dL — ABNORMAL HIGH (ref 70–99)
Potassium: 4.5 mmol/L (ref 3.5–5.1)
Sodium: 138 mmol/L (ref 135–145)

## 2021-02-11 LAB — RETICULOCYTES
Immature Retic Fract: 24.7 % — ABNORMAL HIGH (ref 2.3–15.9)
RBC.: 2.83 MIL/uL — ABNORMAL LOW (ref 4.22–5.81)
Retic Count, Absolute: 149.7 10*3/uL (ref 19.0–186.0)
Retic Ct Pct: 5.3 % — ABNORMAL HIGH (ref 0.4–3.1)

## 2021-02-11 LAB — CULTURE, BLOOD (ROUTINE X 2)
Culture: NO GROWTH
Culture: NO GROWTH
Special Requests: ADEQUATE
Special Requests: ADEQUATE

## 2021-02-11 MED ORDER — LIDOCAINE HCL 1 % IJ SOLN
INTRAMUSCULAR | Status: DC | PRN
  Administered 2021-02-11: 10 mL

## 2021-02-11 MED ORDER — LIDOCAINE HCL 1 % IJ SOLN
INTRAMUSCULAR | Status: AC
  Filled 2021-02-11: qty 20

## 2021-02-11 MED ORDER — HEPARIN SOD (PORK) LOCK FLUSH 100 UNIT/ML IV SOLN: 500.0000 [IU] | INTRAVENOUS | Status: DC | PRN

## 2021-02-11 NOTE — Progress Notes (Signed)
Pt discharged home, 18 mL left in PCA Dilaudid syringe, witness waste with second RN, Holli Socin.

## 2021-02-11 NOTE — Discharge Summary (Signed)
Physician Discharge Summary  [name] [name]. SNK:[id number] DOB: 05/26/1980 DOA: 02/06/2021  PCP: [name], FNP  Admit date: 02/06/2021  Discharge date: 02/11/2021  Discharge Diagnoses:  Principal Problem:   Sickle cell pain crisis ([place]) Active Problems:   TOBACCO ABUSE   Sickle cell crisis ([place])   Acute kidney injury superimposed on chronic kidney disease ([place])   Transaminitis   GERD (gastroesophageal reflux disease)   Discharge Condition: Stable  Disposition:   Follow-up Information     [name], FNP Follow up.   Specialty: Family Medicine Contact information: [address] [place]. [place] [address] [phone]         Buford Dresser, MD .   Specialty: Cardiology Contact information: [address] [place]  [address] [phone]                Pt is discharged home in good condition and is to follow up with [name], FNP this week to have labs evaluated. [name] A Cafarella Jr. is instructed to increase activity slowly and balance with rest for the next few days, and use prescribed medication to complete treatment of pain  Diet: Regular Wt Readings from Last 3 Encounters:  02/06/21 50.7 kg  01/11/21 54.3 kg  12/10/20 49 kg    History of present illness:  [name] [name] is a 40 year old male with a medical history significant for right iliopsoas abscess, iron noisiness of the brain, bacterial pneumonia, stage IV CKD, GERD, male gynecomastia, immune complex glomerulonephritis, and/or sickle cell nephropathy, opiate dependence with withdrawal, migraine headaches, unspecified sinus tachycardia who comes in with body aches due to sickle cell pain crisis after being called by his nephrologist who has him come to the emergency department due to worsening renal functioning.  No fever, chills, rhinorrhea, sore throat, wheezing, or hemoptysis.  He feels mildly dyspneic.  No chest pain,  palpitations, diaphoresis, PND, orthopnea, or pitting edema lower extremities.  Denied abdominal pain, nausea, emesis, diarrhea, melena, or hematochezia.  He sometimes gets constipated.  No dysuria, frequency, or hematuria.  No polyuria, polydipsia, polyphagia, or blurry vision.  ED course: Initial vital signs were temperature 98.4 F, pulse 126, respirations 18, BP 157/90 , Oxygen saturation 100% on room air.  Patient received 1000 mL normal saline bolus, Zofran 4 mg IVP x2, hydromorphone 1 mg IVP x2.  Lab work: CBC showed a white count of 19.7 with 44% neutrophils and 44% lymphocytes.  Hemoglobin was 7.1 g/dL and platelets 209.  Reticulocyte count was 11%.  CMP showed normal electrolytes and calcium was corrected to albumin.  Glucose 148, BUN 34, creatinine 3.81, total bilirubin 2.0 mg/dL.  Total protein 7.4 and albumin 3.4 g/dL.  AST 66, ALT 54, and alkaline phosphatase 250 units/L.  Lipase was normal.  Influenza and coronavirus PCR was negative.  Blood cultures x2 were drawn.  Imaging: 2 view chest radiograph did not show any acute cardiopulmonary disease.  There was bronchial thickening due to chronic bronchitis and or smoking.  See imaging and for radiology for further details.  Hospital Course:  Sickle cell disease with pain crisis:  Patient was admitted for sickle cell pain crisis and managed appropriately with IVF, IV Dilaudid via PCA as well as other adjunct therapies per sickle cell pain management protocols.  Pain intensity decreased to 3/10.  Patient is requesting discharge home.  Patient advised to resume his home medications.  Also, he will place prescription refill request per office protocols.  Acute kidney  injury superimposed on chronic kidney disease stage IV: Improved with gentle hydration.  All nephrotoxins were avoided.  Serum creatinine trended down throughout admission.  Prior to discharge creatinine 2.8.  Patient will follow-up with nephrology team as scheduled.  He will  follow-up with PCP in 1 week to repeat labs including complete metabolic panel.  Anemia of chronic disease: Patient has severe anemia of chronic disease.  Hemoglobin decreased to 6.5 g/dL.  Patient was transfused 2 units PRBCs.  Prior to discharge, hemoglobin 10.0, which is above patient's baseline.  Patient follows up with his hematology team consistently and has been advised to resume all of his home medications.  Patient was therefore discharged home today in a hemodynamically stable condition.   [name] [name] will follow-up with PCP within 1 week of this discharge. [name] [name] was counseled extensively about nonpharmacologic means of pain management, patient verbalized understanding and was appreciative of  the care received during this admission.   We discussed the need for good hydration, monitoring of hydration status, avoidance of heat, cold, stress, and infection triggers. We discussed the need to be adherent with taking Hydrea and other home medications. Patient was reminded of the need to seek medical attention immediately if any symptom of bleeding, anemia, or infection occurs.  Discharge Exam: Vitals:   02/11/21 0435 02/11/21 0823  BP: 122/60   Pulse: 90   Resp: 18 11  Temp: 98.2 F (36.8 C)   SpO2: 95% 95%   Vitals:   02/11/21 0140 02/11/21 0415 02/11/21 0435 02/11/21 0823  BP: 126/87  122/60   Pulse: 70  90   Resp: 18 16 18 11   Temp: 98.7 F (37.1 C)  98.2 F (36.8 C)   TempSrc: Oral  Oral   SpO2: 96% 95% 95% 95%  Weight:      Height:        General appearance : Awake, alert, not in any distress. Speech Clear. Not toxic looking HEENT: Atraumatic and Normocephalic, pupils equally reactive to light and accomodation Neck: Supple, no JVD. No cervical lymphadenopathy.  Chest: Good air entry bilaterally, no added sounds  CVS: S1 S2 regular, no murmurs.  Abdomen: Bowel sounds present, Non tender and not distended with no gaurding, rigidity or rebound. Extremities: B/L Lower Ext  shows no edema, both legs are warm to touch Neurology: Awake alert, and oriented X 3, CN II-XII intact, Non focal Skin: No Rash  Discharge Instructions  Discharge Instructions     Discharge patient   Complete by: As directed    Discharge disposition: 01-Home or Self Care   Discharge patient date: 02/11/2021      Allergies as of 02/11/2021       Reactions   Nsaids Other (See Comments)   Kidney disease   Morphine And Related Other (See Comments)   "Real bad headaches"         Medication List     STOP taking these medications    melatonin 3 MG Tabs tablet       TAKE these medications    acetaminophen 650 MG CR tablet Commonly known as: TYLENOL Take 650 mg by mouth every 8 (eight) hours as needed for pain.   calcitRIOL 0.25 MCG capsule Commonly known as: ROCALTROL Take 0.25 mcg by mouth daily.   Ferriprox Twice-A-Day 1000 MG Tabs Generic drug: Deferiprone (Twice Daily) Take 1,000 mg by mouth 2 (two) times daily.   folic acid 1 MG tablet Commonly known as: FOLVITE TAKE 1 TABLET BY MOUTH EVERY DAY  hydroxyurea 500 MG capsule Commonly known as: HYDREA Take 500 mg by mouth daily.   Lokelma 10 g Pack packet Generic drug: sodium zirconium cyclosilicate Take 10 g by mouth every other day.   nortriptyline 25 MG capsule Commonly known as: PAMELOR Take 25 mg by mouth at bedtime as needed for sleep.   Oxbryta 500 MG Tabs tablet Generic drug: voxelotor Take 1,500 mg by mouth daily.   OxyCONTIN 15 mg 12 hr tablet Generic drug: oxyCODONE Take 1 tablet (15 mg total) by mouth every 12 (twelve) hours.   Oxycodone HCl 10 MG Tabs Take 1 tablet (10 mg total) by mouth every 6 (six) hours as needed for up to 15 days.   sodium bicarbonate 650 MG tablet Take 1,950 mg by mouth 3 (three) times daily.        The results of significant diagnostics from this hospitalization (including imaging, microbiology, ancillary and laboratory) are listed below for reference.     Significant Diagnostic Studies: DG Chest 2 View  Result Date: 02/06/2021 CLINICAL DATA:  Shortness of breath.  Sickle cell crisis. 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 Multiple infarcts involving bilateral ribs. Cholecystectomy. Midline trachea. Normal heart size and mediastinal contours. No pleural effusion or pneumothorax. Similar interstitial coarsening which is likely related to smoking/chronic bronchitis. No lobar consolidation. IMPRESSION: No acute cardiopulmonary disease. Peribronchial thickening which may relate to chronic bronchitis or smoking. Electronically Signed   By: Abigail Miyamoto M.D.   On: 02/06/2021 14:57   IR Fluoro Guide CV Line Right  Result Date: 02/07/2021 INDICATION: Sickle cell crisis, access for IV hydration and pain management EXAM: RIGHT IJ SINGLE-LUMEN TUNNELED POWER PICC LINE MEDICATIONS: 1% LIDOCAINE LOCAL ANESTHESIA/SEDATION: None. FLUOROSCOPY TIME:  Fluoroscopy Time: 0 minutes 12 seconds (0 mGy). COMPLICATIONS: None immediate. PROCEDURE: Informed written consent was obtained from the patient after a thorough discussion of the procedural risks, benefits and alternatives. All questions were addressed. Maximal Sterile Barrier Technique was utilized including caps, mask, sterile gowns, sterile gloves, sterile drape, hand hygiene and skin antiseptic. A timeout was performed prior to the initiation of the procedure. Under sterile conditions and local anesthesia, ultrasound micropuncture access performed of the right internal jugular vein. Images obtained for documentation of the patent right internal jugular vein. Guidewire advanced centrally under fluoroscopy. Peel-away sheath inserted. Measurements obtained for the appropriate length. In the right infraclavicular chest, a subcutaneous tunnel was created under sterile conditions and local anesthesia. The cuff PICC line was tunneled subcutaneously to the  entry site and advanced into the SVC RA junction through the peel-away sheath. Position confirmed with fluoroscopy. Images obtained for documentation. Blood aspirated easily followed by saline flushing. External caps applied. Catheter secured with Ethilon suture. Entry site closed with Dermabond the right IJ puncture. Sterile dressing applied. No immediate complication. Patient tolerated the procedure well. IMPRESSION: Successful right IJ single-lumen tunneled power PICC line. Tip SVC RA junction. Ready for use. Electronically Signed   By: Jerilynn Mages.  Shick M.D.   On: 02/07/2021 14:40   IR US Guide Vasc Access Right  Result Date: 02/07/2021 INDICATION: Sickle cell crisis, access for IV hydration and pain management EXAM: RIGHT IJ SINGLE-LUMEN TUNNELED POWER PICC LINE MEDICATIONS: 1% LIDOCAINE LOCAL ANESTHESIA/SEDATION: None. FLUOROSCOPY TIME:  Fluoroscopy Time: 0 minutes 12 seconds (0 mGy). COMPLICATIONS: None immediate. PROCEDURE: Informed written consent was obtained from the patient after a thorough discussion of the procedural risks, benefits and alternatives. All questions were addressed. Maximal  Sterile Barrier Technique was utilized including caps, mask, sterile gowns, sterile gloves, sterile drape, hand hygiene and skin antiseptic. A timeout was performed prior to the initiation of the procedure. Under sterile conditions and local anesthesia, ultrasound micropuncture access performed of the right internal jugular vein. Images obtained for documentation of the patent right internal jugular vein. Guidewire advanced centrally under fluoroscopy. Peel-away sheath inserted. Measurements obtained for the appropriate length. In the right infraclavicular chest, a subcutaneous tunnel was created under sterile conditions and local anesthesia. The cuff PICC line was tunneled subcutaneously to the entry site and advanced into the SVC RA junction through the peel-away sheath. Position confirmed with fluoroscopy. Images  obtained for documentation. Blood aspirated easily followed by saline flushing. External caps applied. Catheter secured with Ethilon suture. Entry site closed with Dermabond the right IJ puncture. Sterile dressing applied. No immediate complication. Patient tolerated the procedure well. IMPRESSION: Successful right IJ single-lumen tunneled power PICC line. Tip SVC RA junction. Ready for use. Electronically Signed   By: Jerilynn Mages.  Shick M.D.   On: 02/07/2021 14:40   [place] EKG SITE RITE  Result Date: 02/07/2021 If Site Rite image not attached, placement could not be confirmed due to current cardiac rhythm.   Microbiology: Recent Results (from the past 240 hour(s))  Resp Panel by RT-PCR (Flu A&B, Covid) Nasopharyngeal Swab     Status: None   Collection Time: 02/06/21  3:15 PM   Specimen: Nasopharyngeal Swab; Nasopharyngeal(NP) swabs in vial transport medium  Result Value Ref Range Status   SARS Coronavirus 2 by RT PCR NEGATIVE NEGATIVE Final    Comment: (NOTE) SARS-CoV-2 target nucleic acids are NOT DETECTED.  The SARS-CoV-2 RNA is generally detectable in upper respiratory specimens during the acute phase of infection. The lowest concentration of SARS-CoV-2 viral copies this assay can detect is 138 copies/mL. A negative result does not preclude SARS-Cov-2 infection and should not be used as the sole basis for treatment or other patient management decisions. A negative result may occur with  improper specimen collection/handling, submission of specimen other than nasopharyngeal swab, presence of viral mutation(s) within the areas targeted by this assay, and inadequate number of viral copies(<138 copies/mL). A negative result must be combined with clinical observations, patient history, and epidemiological information. The expected result is Negative.  Fact Sheet for Patients:  EntrepreneurPulse.com.au  Fact Sheet for Healthcare Providers:   IncredibleEmployment.be  This test is no t yet approved or cleared by the [place] FDA and  has been authorized for detection and/or diagnosis of SARS-CoV-2 by FDA under an Emergency Use Authorization (EUA). This EUA will remain  in effect (meaning this test can be used) for the duration of the COVID-19 declaration under Section 564(b)(1) of the Act, 21 U.S.C.section 360bbb-3(b)(1), unless the authorization is terminated  or revoked sooner.       Influenza A by PCR NEGATIVE NEGATIVE Final   Influenza B by PCR NEGATIVE NEGATIVE Final    Comment: (NOTE) The Xpert Xpress SARS-CoV-2/FLU/RSV plus assay is intended as an aid in the diagnosis of influenza from Nasopharyngeal swab specimens and should not be used as a sole basis for treatment. Nasal washings and aspirates are unacceptable for Xpert Xpress SARS-CoV-2/FLU/RSV testing.  Fact Sheet for Patients: EntrepreneurPulse.com.au  Fact Sheet for Healthcare Providers: IncredibleEmployment.be  This test is not yet approved or cleared by the [place] FDA and has been authorized for detection and/or diagnosis of SARS-CoV-2 by FDA under an Emergency Use Authorization (EUA). This EUA will remain in effect (  meaning this test can be used) for the duration of the COVID-19 declaration under Section 564(b)(1) of the Act, 21 U.S.C. section 360bbb-3(b)(1), unless the authorization is terminated or revoked.  Performed at [facility], [place] [address], [place], [place] [address]   Blood culture (routine x 2)     Status: None   Collection Time: 02/06/21  3:45 PM   Specimen: BLOOD  Result Value Ref Range Status   Specimen Description   Final    BLOOD SITE NOT SPECIFIED Performed at [facility] [address], [place], [place] [address]    Special Requests   Final    BOTTLES DRAWN AEROBIC AND ANAEROBIC Blood Culture adequate  volume Performed at [facility] [address], [place], [place] [address]    Culture   Final    NO GROWTH 5 DAYS Performed at [facility], [place] [address], [place], [place] [address]    Report Status 02/11/2021 FINAL  Final  Blood culture (routine x 2)     Status: None   Collection Time: 02/06/21  4:00 PM   Specimen: BLOOD  Result Value Ref Range Status   Specimen Description   Final    BLOOD RIGHT ANTECUBITAL Performed at [facility] [address] [place], [place] [address]    Special Requests   Final    BOTTLES DRAWN AEROBIC AND ANAEROBIC Blood Culture adequate volume Performed at [facility] [address], [place], [place] [address]    Culture   Final    NO GROWTH 5 DAYS Performed at [facility], [place] [address] [place], [place] [address]    Report Status 02/11/2021 FINAL  Final     Labs: Basic Metabolic Panel: Recent Labs  Lab 02/06/21 1501 02/08/21 0505 02/09/21 0618 02/10/21 0500 02/11/21 0450  NA 135 135 136 137 138  K 4.3 4.3 4.9 4.9 4.5  CL 101 103 109 106 102  CO2 25 25 24 22 24   GLUCOSE 148* 103* 98 93 115*  BUN 34* 21* 26* 28* 30*  CREATININE 3.81* 2.83* 3.13* 2.96* 2.79*  CALCIUM 8.8* 8.1* 8.4* 8.5* 8.7*   Liver Function Tests: Recent Labs  Lab 02/06/21 1501 02/08/21 0505 02/09/21 0618 02/10/21 0500  AST 66* 51* 56* 54*  ALT 54* 39 40 41  ALKPHOS 250* 202* 190* 192*  BILITOT 2.0* 1.3* 1.7* 1.6*  PROT 7.4 6.1* 6.1* 6.0*  ALBUMIN 3.4* 2.8* 2.8* 2.7*   Recent Labs  Lab 02/06/21 1501  LIPASE 25   No results for input(s): AMMONIA in the last 168 hours. CBC: Recent Labs  Lab 02/06/21 1501 02/08/21 0640 02/08/21 2035 02/09/21 0618 02/10/21 0500 02/11/21 0450  WBC 19.7* 18.7* 16.0* 15.7* 12.7* 12.5*  NEUTROABS 8.8* 7.5  --  7.9* 5.7 5.7  HGB 7.1* 6.0* 10.2* 9.7* 9.9* 10.0*  HCT 20.3* 17.3* 28.9* 27.5* 28.1* 28.4*  MCV 111.5* 113.8* 96.3 97.9 98.6 100.0   PLT 209 225 255 272 265 247   Cardiac Enzymes: No results for input(s): CKTOTAL, CKMB, CKMBINDEX, TROPONINI in the last 168 hours. BNP: Invalid input(s): POCBNP CBG: No results for input(s): GLUCAP in the last 168 hours.  Time coordinating discharge: 30 minutes  Signed:  [name]  APRN, MSN, FNP-C [facility] [address] [place], [place] [address] [phone]  [facility] 02/11/2021, 1:18 PM

## 2021-02-11 NOTE — Progress Notes (Signed)
Patient discharged following PICC removal, did not want to wait to be observed, discharged paperwork given to pt, refused to review it with RN.

## 2021-02-11 NOTE — Progress Notes (Signed)
Patient was brought down to IR for tunneled CVC removal.   Tunneled CVC removed successfully.  Please see dictation for full report.    [name] PA-C 02/11/21 1526 hrs

## 2021-02-11 NOTE — Progress Notes (Addendum)
Patient agitated with nursing assessment this morning, cussing at RN when asked orientation questions, refusing shift assessment. Charge RN made aware.   Pt on PCA pump, WNL, sleepy but arousable, pt refuses to wear pulse oximetry. Will continue to monitor.

## 2021-02-18 ENCOUNTER — Other Ambulatory Visit: Payer: Self-pay

## 2021-02-18 ENCOUNTER — Telehealth: Payer: Self-pay

## 2021-02-18 ENCOUNTER — Non-Acute Institutional Stay (HOSPITAL_COMMUNITY)
Admission: RE | Admit: 2021-02-18 | Discharge: 2021-02-18 | Disposition: A | Discharge status: Home / Self Care | Payer: Medicare HMO | Source: Ambulatory Visit | Attending: Internal Medicine | Admitting: Internal Medicine

## 2021-02-18 ENCOUNTER — Other Ambulatory Visit: Payer: Self-pay | Admitting: Family Medicine

## 2021-02-18 ENCOUNTER — Other Ambulatory Visit (HOSPITAL_COMMUNITY): Payer: Self-pay

## 2021-02-18 DIAGNOSIS — D571 Sickle-cell disease without crisis: Secondary | ICD-10-CM | POA: Diagnosis not present

## 2021-02-18 DIAGNOSIS — D5700 Hb-SS disease with crisis, unspecified: Secondary | ICD-10-CM

## 2021-02-18 LAB — CBC WITH DIFFERENTIAL/PLATELET
Abs Immature Granulocytes: 0.03 10*3/uL (ref 0.00–0.07)
Basophils Absolute: 0 10*3/uL (ref 0.0–0.1)
Basophils Relative: 0 %
Eosinophils Absolute: 0.3 10*3/uL (ref 0.0–0.5)
Eosinophils Relative: 2 %
HCT: 31.3 % — ABNORMAL LOW (ref 39.0–52.0)
Hemoglobin: 10.8 g/dL — ABNORMAL LOW (ref 13.0–17.0)
Immature Granulocytes: 0 %
Lymphocytes Relative: 44 %
Lymphs Abs: 6 10*3/uL — ABNORMAL HIGH (ref 0.7–4.0)
MCH: 35.9 pg — ABNORMAL HIGH (ref 26.0–34.0)
MCHC: 34.5 g/dL (ref 30.0–36.0)
MCV: 104 fL — ABNORMAL HIGH (ref 80.0–100.0)
Monocytes Absolute: 0.9 10*3/uL (ref 0.1–1.0)
Monocytes Relative: 7 %
Neutro Abs: 6.5 10*3/uL (ref 1.7–7.7)
Neutrophils Relative %: 47 %
Platelets: 305 10*3/uL (ref 150–400)
RBC: 3.01 MIL/uL — ABNORMAL LOW (ref 4.22–5.81)
RDW: 24.1 % — ABNORMAL HIGH (ref 11.5–15.5)
WBC: 13.8 10*3/uL — ABNORMAL HIGH (ref 4.0–10.5)
nRBC: 0 % (ref 0.0–0.2)

## 2021-02-18 LAB — COMPREHENSIVE METABOLIC PANEL
ALT: 28 U/L (ref 0–44)
AST: 34 U/L (ref 15–41)
Albumin: 3.3 g/dL — ABNORMAL LOW (ref 3.5–5.0)
Alkaline Phosphatase: 190 U/L — ABNORMAL HIGH (ref 38–126)
Anion gap: 8 (ref 5–15)
BUN: 38 mg/dL — ABNORMAL HIGH (ref 6–20)
CO2: 26 mmol/L (ref 22–32)
Calcium: 8.2 mg/dL — ABNORMAL LOW (ref 8.9–10.3)
Chloride: 105 mmol/L (ref 98–111)
Creatinine, Ser: 2.96 mg/dL — ABNORMAL HIGH (ref 0.61–1.24)
GFR, Estimated: 27 mL/min — ABNORMAL LOW (ref >60)
Glucose, Bld: 91 mg/dL (ref 70–99)
Potassium: 5.3 mmol/L — ABNORMAL HIGH (ref 3.5–5.1)
Sodium: 139 mmol/L (ref 135–145)
Total Bilirubin: 0.8 mg/dL (ref 0.3–1.2)
Total Protein: 6.9 g/dL (ref 6.5–8.1)

## 2021-02-18 LAB — RETICULOCYTES
Immature Retic Fract: 13.1 % (ref 2.3–15.9)
RBC.: 2.98 MIL/uL — ABNORMAL LOW (ref 4.22–5.81)
Retic Count, Absolute: 42.9 10*3/uL (ref 19.0–186.0)
Retic Ct Pct: 1.4 % (ref 0.4–3.1)

## 2021-02-18 NOTE — Telephone Encounter (Signed)
Oxycodone  °

## 2021-02-18 NOTE — Progress Notes (Signed)
PATIENT CARE CENTER NOTE   Diagnosis: Sickle Cell Anemia    Provider: Hollis, [place], FNP   Procedure: Lab work   Note:  Patient arrived for follow-up lab work after discharge from hospital. Labs drawn peripherally (CBC, CMP, Retic). Patient tolerated well. Alert, oriented and ambulatory at discharge.

## 2021-02-19 ENCOUNTER — Other Ambulatory Visit: Payer: Self-pay | Admitting: Family Medicine

## 2021-02-19 ENCOUNTER — Other Ambulatory Visit (HOSPITAL_COMMUNITY): Payer: Self-pay

## 2021-02-19 DIAGNOSIS — G894 Chronic pain syndrome: Secondary | ICD-10-CM

## 2021-02-19 MED ORDER — OXYCODONE HCL 10 MG PO TABS
10.0000 mg | ORAL_TABLET | Freq: Four times a day (QID) | ORAL | 0 refills | Status: DC | PRN
  Filled 2021-02-19: qty 60, 15d supply, fill #0

## 2021-02-19 NOTE — Progress Notes (Signed)
Reviewed PDMP substance reporting system prior to prescribing opiate medications. No inconsistencies noted.  Meds ordered this encounter  Medications   Oxycodone HCl 10 MG TABS    Sig: Take 1 tablet (10 mg total) by mouth every 6 (six) hours as needed.    Dispense:  60 tablet    Refill:  0    Order Specific Question:   Supervising Provider    Answer:   [name], [name] [[id number]]   Addis Tuohy Moore Simonne Boulos  APRN, MSN, FNP-C [facility] [address]  [place], [place] [address] [phone]  

## 2021-02-21 ENCOUNTER — Other Ambulatory Visit: Payer: Self-pay | Admitting: Family Medicine

## 2021-02-21 ENCOUNTER — Other Ambulatory Visit (HOSPITAL_COMMUNITY): Payer: Self-pay

## 2021-02-21 ENCOUNTER — Telehealth: Payer: Self-pay

## 2021-02-21 DIAGNOSIS — G894 Chronic pain syndrome: Secondary | ICD-10-CM

## 2021-02-21 MED ORDER — OXYCODONE HCL ER 15 MG PO T12A
15.0000 mg | EXTENDED_RELEASE_TABLET | Freq: Two times a day (BID) | ORAL | 0 refills | Status: DC
  Filled 2021-02-21 – 2021-02-24 (×2): qty 60, 30d supply, fill #0

## 2021-02-21 NOTE — Telephone Encounter (Signed)
Oxy contin

## 2021-02-21 NOTE — Progress Notes (Signed)
Reviewed PDMP substance reporting system prior to prescribing opiate medications. No inconsistencies noted.  Meds ordered this encounter  Medications   oxyCODONE (OXYCONTIN) 15 mg 12 hr tablet    Sig: Take 1 tablet (15 mg total) by mouth every 12 (twelve) hours.    Dispense:  60 tablet    Refill:  0    Order Specific Question:   Supervising Provider    Answer:   [name], [name] [[id number]]   [name] [name] Ermin Parisien  APRN, MSN, FNP-C [facility] [address]  [place], [place] [address] [phone]  

## 2021-02-22 ENCOUNTER — Other Ambulatory Visit (HOSPITAL_COMMUNITY): Payer: Self-pay

## 2021-02-24 ENCOUNTER — Other Ambulatory Visit (HOSPITAL_COMMUNITY): Payer: Self-pay

## 2021-02-25 DIAGNOSIS — M25562 Pain in left knee: Secondary | ICD-10-CM | POA: Diagnosis not present

## 2021-02-25 DIAGNOSIS — M25561 Pain in right knee: Secondary | ICD-10-CM | POA: Diagnosis not present

## 2021-02-25 DIAGNOSIS — M25569 Pain in unspecified knee: Secondary | ICD-10-CM | POA: Diagnosis not present

## 2021-02-25 DIAGNOSIS — F1190 Opioid use, unspecified, uncomplicated: Secondary | ICD-10-CM | POA: Diagnosis not present

## 2021-02-25 DIAGNOSIS — G894 Chronic pain syndrome: Secondary | ICD-10-CM | POA: Diagnosis not present

## 2021-02-25 DIAGNOSIS — G8929 Other chronic pain: Secondary | ICD-10-CM | POA: Diagnosis not present

## 2021-02-25 DIAGNOSIS — D571 Sickle-cell disease without crisis: Secondary | ICD-10-CM | POA: Diagnosis not present

## 2021-03-01 DIAGNOSIS — N183 Chronic kidney disease, stage 3 unspecified: Secondary | ICD-10-CM | POA: Diagnosis not present

## 2021-03-01 DIAGNOSIS — M25561 Pain in right knee: Secondary | ICD-10-CM | POA: Diagnosis not present

## 2021-03-01 DIAGNOSIS — G8929 Other chronic pain: Secondary | ICD-10-CM | POA: Diagnosis not present

## 2021-03-01 DIAGNOSIS — Z79899 Other long term (current) drug therapy: Secondary | ICD-10-CM | POA: Diagnosis not present

## 2021-03-01 DIAGNOSIS — D571 Sickle-cell disease without crisis: Secondary | ICD-10-CM | POA: Diagnosis not present

## 2021-03-01 DIAGNOSIS — N3000 Acute cystitis without hematuria: Secondary | ICD-10-CM | POA: Diagnosis not present

## 2021-03-01 DIAGNOSIS — R5383 Other fatigue: Secondary | ICD-10-CM | POA: Diagnosis not present

## 2021-03-02 ENCOUNTER — Telehealth: Payer: Self-pay

## 2021-03-02 NOTE — Telephone Encounter (Signed)
Transition Care Management Unsuccessful Follow-up Telephone Call  Date of discharge and from where:  02/11/21 from [facility]  Attempts:  1st Attempt  Reason for unsuccessful TCM follow-up call:  Reports not a good time to talk.  Thea Silversmith, RN, MSN, BSN, [place] Care Management Coordinator [phone]

## 2021-03-06 IMAGING — CR DG CHEST 2V
2 series · 2 of 2 positions shown · non-contrast
Comparison: 08/25/2019

CLINICAL DATA: Short of breath

EXAM:
CHEST - 2 VIEW

[x chest ap]
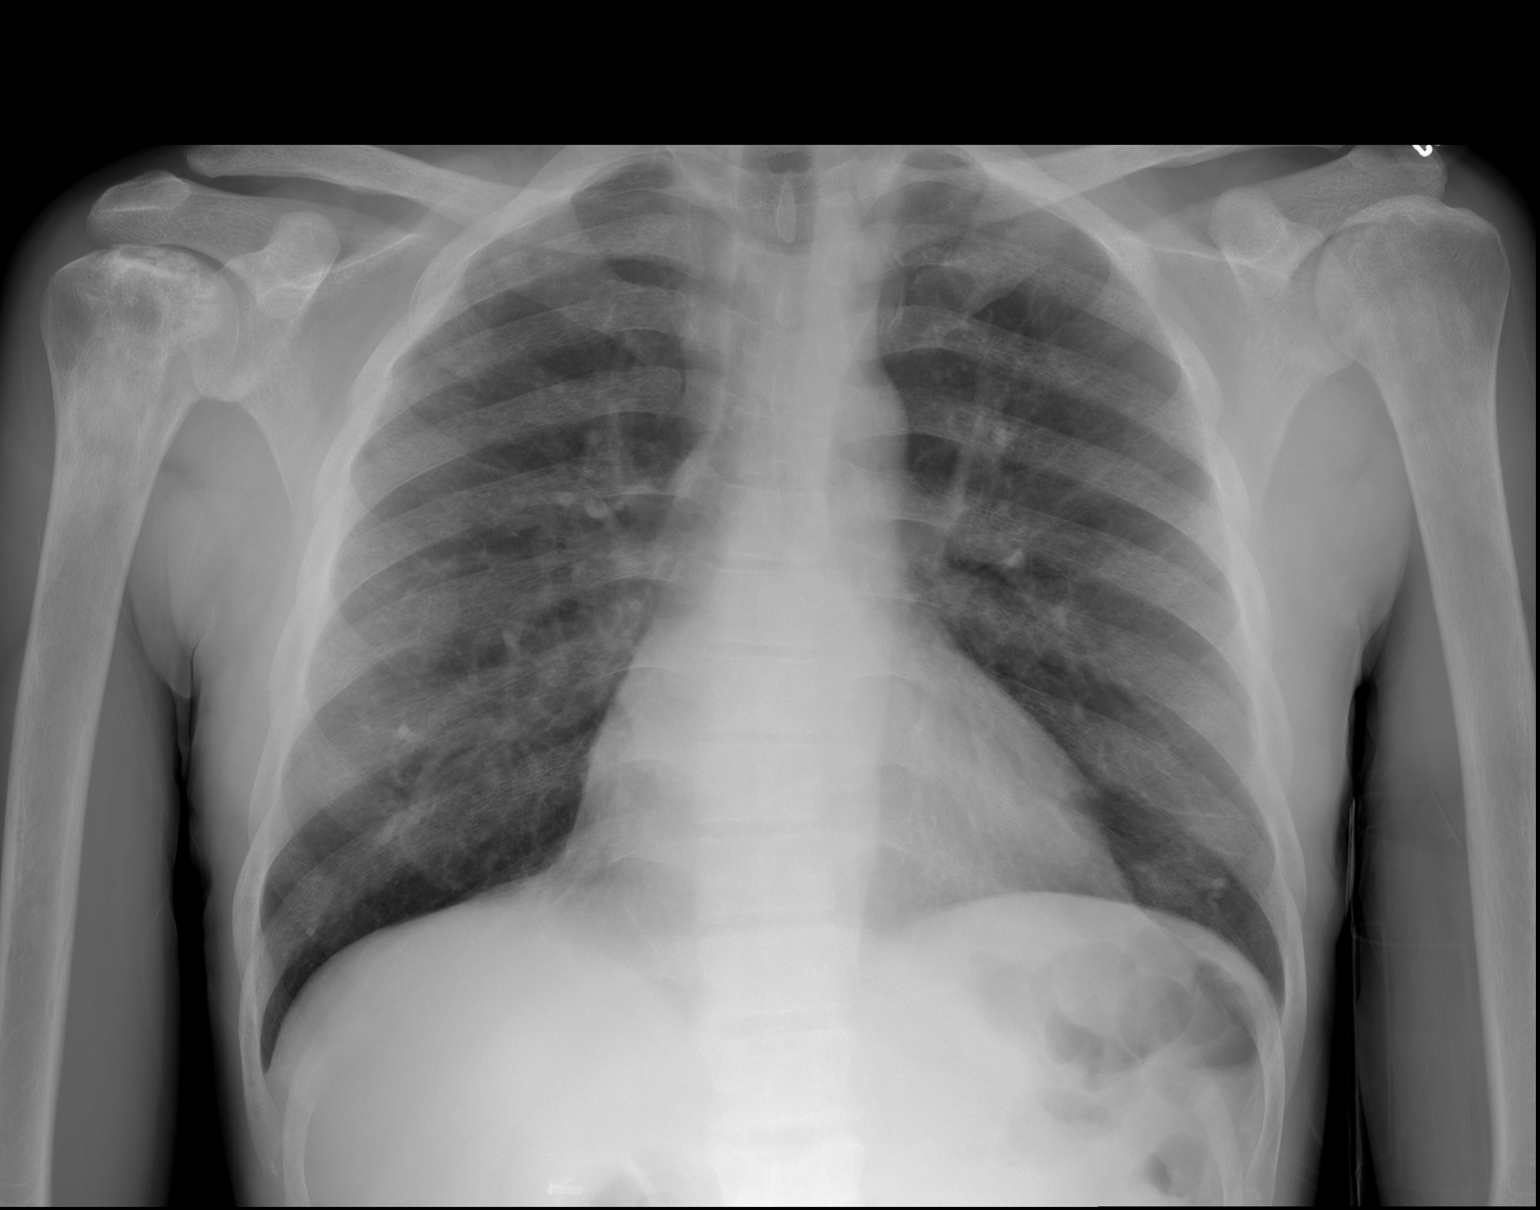

[w chest lat]
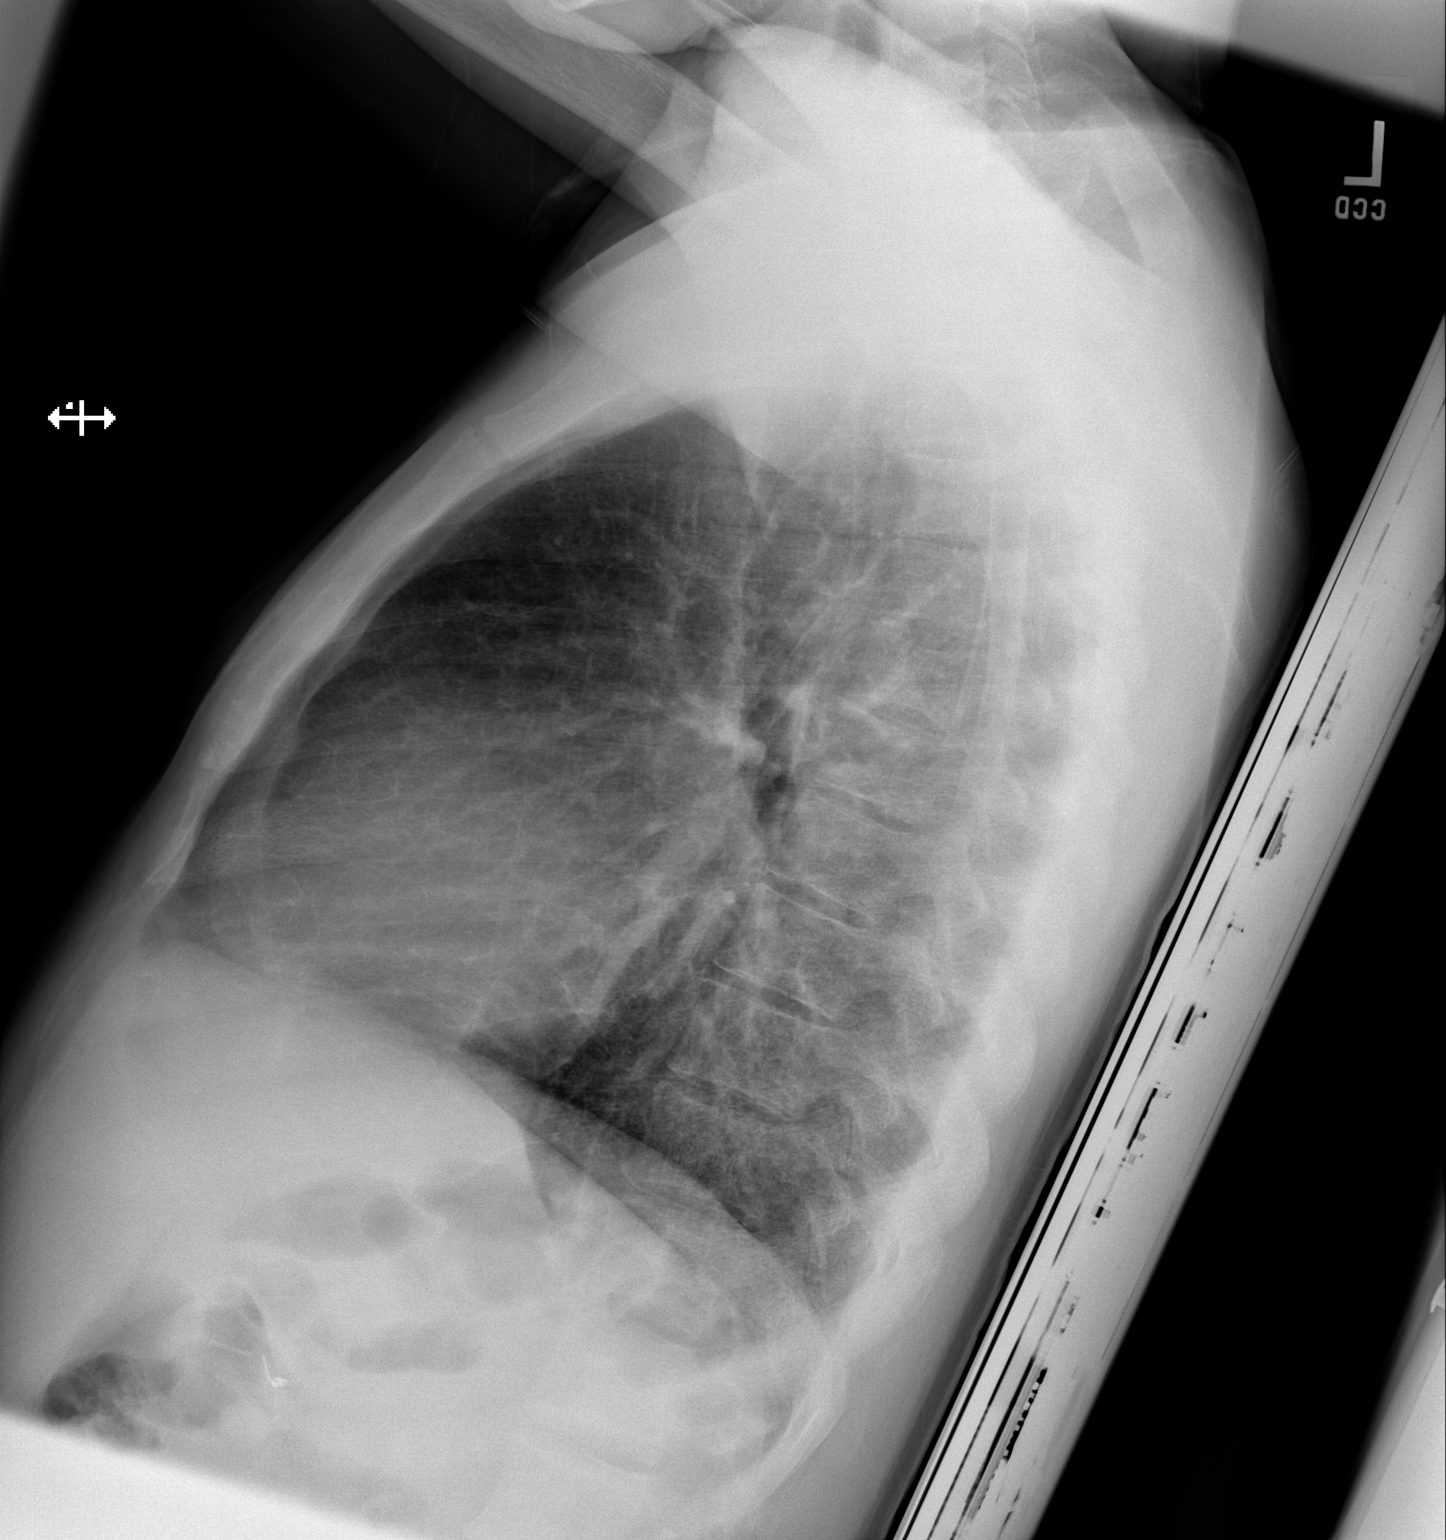

[2 of 2 positions shown; findings below may reference images not displayed]

FINDINGS: Cardiac and mediastinal contours normal. Vascularity normal. Lungs
clear without infiltrate effusion

Skeletal changes of sickle cell anemia with avascular necrosis right
humeral head.
IMPRESSION: No active cardiopulmonary disease.

## 2021-03-07 ENCOUNTER — Telehealth: Payer: Self-pay

## 2021-03-07 NOTE — Telephone Encounter (Signed)
Transition Care Management Follow-up Telephone Call Date of discharge and from where: 02/11/2021   [facility] How have you been since you were released from the hospital? Doing good Any questions or concerns? No  Items Reviewed: Did the pt receive and understand the discharge instructions provided? Yes  Medications obtained and verified? Yes  Other? No  Any new allergies since your discharge? No  Dietary orders reviewed? No Do you have support at home?  unknown  Home Care and Equipment/Supplies: Were home health services ordered? not applicable If so, what is the name of the agency?  Has the agency set up a time to come to the patient's home? not applicable Were any new equipment or medical supplies ordered?  No What is the name of the medical supply agency?  Were you able to get the supplies/equipment? not applicable Do you have any questions related to the use of the equipment or supplies?   Functional Questionnaire: (I = Independent and D = Dependent) ADLs: I  Bathing/Dressing- I  Meal Prep- I  Eating- I  Maintaining continence- I  Transferring/Ambulation- I  Managing Meds- I  Follow up appointments reviewed:  PCP Hospital f/u appt confirmed?  Scheduled to see Hollis on 04/19/2021 10. [facility] f/u appt confirmed? No   Are transportation arrangements needed? No  If their condition worsens, is the pt aware to call PCP or go to the Emergency Dept.? Yes Was the patient provided with contact information for the PCP's office or ED? Yes Was to pt encouraged to call back with questions or concerns? Yes  Tomasa Rand, RN, BSN, CEN [facility] [employer] [phone]

## 2021-03-08 DIAGNOSIS — G43009 Migraine without aura, not intractable, without status migrainosus: Secondary | ICD-10-CM | POA: Diagnosis not present

## 2021-03-08 DIAGNOSIS — H538 Other visual disturbances: Secondary | ICD-10-CM | POA: Diagnosis not present

## 2021-03-08 DIAGNOSIS — R519 Headache, unspecified: Secondary | ICD-10-CM | POA: Diagnosis not present

## 2021-03-15 ENCOUNTER — Encounter (HOSPITAL_COMMUNITY): Payer: Medicare HMO

## 2021-03-17 ENCOUNTER — Telehealth: Payer: Self-pay

## 2021-03-17 NOTE — Telephone Encounter (Signed)
Reminder per pt   Oxy contin Oxycodone

## 2021-03-18 ENCOUNTER — Other Ambulatory Visit (HOSPITAL_COMMUNITY): Payer: Self-pay

## 2021-03-18 ENCOUNTER — Other Ambulatory Visit: Payer: Self-pay | Admitting: Family Medicine

## 2021-03-18 ENCOUNTER — Other Ambulatory Visit: Payer: Self-pay

## 2021-03-18 ENCOUNTER — Non-Acute Institutional Stay (HOSPITAL_COMMUNITY)
Admission: RE | Admit: 2021-03-18 | Discharge: 2021-03-18 | Disposition: A | Discharge status: Home / Self Care | Payer: Medicare HMO | Source: Ambulatory Visit | Attending: Internal Medicine | Admitting: Internal Medicine

## 2021-03-18 DIAGNOSIS — D571 Sickle-cell disease without crisis: Secondary | ICD-10-CM | POA: Insufficient documentation

## 2021-03-18 DIAGNOSIS — G894 Chronic pain syndrome: Secondary | ICD-10-CM

## 2021-03-18 DIAGNOSIS — D5700 Hb-SS disease with crisis, unspecified: Secondary | ICD-10-CM

## 2021-03-18 LAB — CBC WITH DIFFERENTIAL/PLATELET
Abs Immature Granulocytes: 0.04 10*3/uL (ref 0.00–0.07)
Basophils Absolute: 0.1 10*3/uL (ref 0.0–0.1)
Basophils Relative: 0 %
Eosinophils Absolute: 0.5 10*3/uL (ref 0.0–0.5)
Eosinophils Relative: 3 %
HCT: 25.4 % — ABNORMAL LOW (ref 39.0–52.0)
Hemoglobin: 9.2 g/dL — ABNORMAL LOW (ref 13.0–17.0)
Immature Granulocytes: 0 %
Lymphocytes Relative: 45 %
Lymphs Abs: 7.7 10*3/uL — ABNORMAL HIGH (ref 0.7–4.0)
MCH: 36.7 pg — ABNORMAL HIGH (ref 26.0–34.0)
MCHC: 36.2 g/dL — ABNORMAL HIGH (ref 30.0–36.0)
MCV: 101.2 fL — ABNORMAL HIGH (ref 80.0–100.0)
Monocytes Absolute: 1.1 10*3/uL — ABNORMAL HIGH (ref 0.1–1.0)
Monocytes Relative: 7 %
Neutro Abs: 7.8 10*3/uL — ABNORMAL HIGH (ref 1.7–7.7)
Neutrophils Relative %: 45 %
Platelets: 298 10*3/uL (ref 150–400)
RBC: 2.51 MIL/uL — ABNORMAL LOW (ref 4.22–5.81)
RDW: 22.8 % — ABNORMAL HIGH (ref 11.5–15.5)
Smear Review: NORMAL
WBC: 17.2 10*3/uL — ABNORMAL HIGH (ref 4.0–10.5)
nRBC: 0 % (ref 0.0–0.2)

## 2021-03-18 LAB — COMPREHENSIVE METABOLIC PANEL
ALT: 40 U/L (ref 0–44)
AST: 40 U/L (ref 15–41)
Albumin: 3.6 g/dL (ref 3.5–5.0)
Alkaline Phosphatase: 198 U/L — ABNORMAL HIGH (ref 38–126)
Anion gap: 9 (ref 5–15)
BUN: 49 mg/dL — ABNORMAL HIGH (ref 6–20)
CO2: 17 mmol/L — ABNORMAL LOW (ref 22–32)
Calcium: 8.2 mg/dL — ABNORMAL LOW (ref 8.9–10.3)
Chloride: 108 mmol/L (ref 98–111)
Creatinine, Ser: 3.5 mg/dL — ABNORMAL HIGH (ref 0.61–1.24)
GFR, Estimated: 22 mL/min — ABNORMAL LOW (ref >60)
Glucose, Bld: 89 mg/dL (ref 70–99)
Potassium: 4.8 mmol/L (ref 3.5–5.1)
Sodium: 134 mmol/L — ABNORMAL LOW (ref 135–145)
Total Bilirubin: 0.9 mg/dL (ref 0.3–1.2)
Total Protein: 6.9 g/dL (ref 6.5–8.1)

## 2021-03-18 MED ORDER — OXYCODONE HCL 10 MG PO TABS
10.0000 mg | ORAL_TABLET | Freq: Four times a day (QID) | ORAL | 0 refills | Status: DC | PRN
  Filled 2021-03-18: qty 60, 15d supply, fill #0

## 2021-03-18 NOTE — Progress Notes (Signed)
Reviewed PDMP substance reporting system prior to prescribing opiate medications. No inconsistencies noted.  Meds ordered this encounter  Medications   Oxycodone HCl 10 MG TABS    Sig: Take 1 tablet (10 mg total) by mouth every 6 (six) hours as needed.    Dispense:  60 tablet    Refill:  0    Order Specific Question:   Supervising Provider    Answer:   [name], [name] [[id number]]   [name] [name] [name]  APRN, MSN, FNP-C [facility] [address]  [place], [place] [address] [phone]  

## 2021-03-18 NOTE — Progress Notes (Signed)
PATIENT CARE CENTER NOTE   Diagnosis: Sickle Cell Anemia    Provider: Hollis, [place], FNP   Procedure: Lab draw    Note: Patient's labs drawn (CBC and CMP) peripherally. Patient tolerated well. Alert, oriented and ambulatory at discharge.

## 2021-03-21 DIAGNOSIS — R519 Headache, unspecified: Secondary | ICD-10-CM | POA: Diagnosis not present

## 2021-03-21 DIAGNOSIS — R29818 Other symptoms and signs involving the nervous system: Secondary | ICD-10-CM | POA: Diagnosis not present

## 2021-03-22 ENCOUNTER — Other Ambulatory Visit (HOSPITAL_COMMUNITY): Payer: Self-pay

## 2021-03-22 ENCOUNTER — Other Ambulatory Visit: Payer: Self-pay | Admitting: Family Medicine

## 2021-03-22 DIAGNOSIS — G894 Chronic pain syndrome: Secondary | ICD-10-CM

## 2021-03-24 ENCOUNTER — Other Ambulatory Visit (HOSPITAL_COMMUNITY): Payer: Self-pay

## 2021-03-24 ENCOUNTER — Other Ambulatory Visit: Payer: Self-pay | Admitting: Family Medicine

## 2021-03-24 DIAGNOSIS — G894 Chronic pain syndrome: Secondary | ICD-10-CM

## 2021-03-24 MED ORDER — OXYCODONE HCL ER 15 MG PO T12A
15.0000 mg | EXTENDED_RELEASE_TABLET | Freq: Two times a day (BID) | ORAL | 0 refills | Status: DC
  Filled 2021-03-26: qty 60, 30d supply, fill #0

## 2021-03-24 NOTE — Progress Notes (Signed)
Reviewed PDMP substance reporting system prior to prescribing opiate medications. No inconsistencies noted.  Meds ordered this encounter  Medications   oxyCODONE (OXYCONTIN) 15 mg 12 hr tablet    Sig: Take 1 tablet (15 mg total) by mouth every 12 (twelve) hours.    Dispense:  60 tablet    Refill:  0    Order Specific Question:   Supervising Provider    Answer:   [name], [name] [[id number]]   Zyan Mirkin Moore Takima Encina  APRN, MSN, FNP-C [facility] [address]  [place], [place] [address] [phone]  

## 2021-03-25 ENCOUNTER — Other Ambulatory Visit: Payer: Self-pay | Admitting: Family Medicine

## 2021-03-25 ENCOUNTER — Other Ambulatory Visit (HOSPITAL_COMMUNITY): Payer: Self-pay

## 2021-03-26 ENCOUNTER — Other Ambulatory Visit (HOSPITAL_COMMUNITY): Payer: Self-pay

## 2021-03-28 ENCOUNTER — Other Ambulatory Visit (HOSPITAL_COMMUNITY): Payer: Self-pay

## 2021-03-29 ENCOUNTER — Other Ambulatory Visit (HOSPITAL_COMMUNITY): Payer: Self-pay

## 2021-03-29 ENCOUNTER — Other Ambulatory Visit: Payer: Self-pay | Admitting: Family Medicine

## 2021-03-29 DIAGNOSIS — G894 Chronic pain syndrome: Secondary | ICD-10-CM

## 2021-03-29 DIAGNOSIS — D571 Sickle-cell disease without crisis: Secondary | ICD-10-CM

## 2021-03-29 MED ORDER — XTAMPZA ER 18 MG PO C12A
18.0000 mg | EXTENDED_RELEASE_CAPSULE | Freq: Two times a day (BID) | ORAL | 0 refills | Status: DC
  Filled 2021-03-29: qty 60, 30d supply, fill #0

## 2021-03-29 NOTE — Progress Notes (Signed)
Reviewed PDMP substance reporting system prior to prescribing opiate medications. No inconsistencies noted.  °Meds ordered this encounter  °Medications  ° oxyCODONE ER (XTAMPZA ER) 18 MG C12A  °  Sig: Take 18 mg by mouth every 12 (twelve) hours.  °  Dispense:  60 capsule  °  Refill:  0  °  Order Specific Question:   Supervising Provider  °  Answer:   [name], [name] [[id number]]  ° Cyleigh Massaro Moore Guerino Caporale  APRN, MSN, FNP-C °[facility] °[facility] °[address]  °[place], [place] [address] [phone] ° °

## 2021-03-30 ENCOUNTER — Other Ambulatory Visit (HOSPITAL_COMMUNITY): Payer: Self-pay

## 2021-04-02 ENCOUNTER — Other Ambulatory Visit (HOSPITAL_COMMUNITY): Payer: Self-pay

## 2021-04-06 ENCOUNTER — Other Ambulatory Visit (HOSPITAL_COMMUNITY): Payer: Self-pay

## 2021-04-06 ENCOUNTER — Other Ambulatory Visit: Payer: Self-pay | Admitting: Internal Medicine

## 2021-04-06 DIAGNOSIS — D571 Sickle-cell disease without crisis: Secondary | ICD-10-CM

## 2021-04-06 DIAGNOSIS — G894 Chronic pain syndrome: Secondary | ICD-10-CM

## 2021-04-06 MED ORDER — XTAMPZA ER 18 MG PO C12A
18.0000 mg | EXTENDED_RELEASE_CAPSULE | Freq: Two times a day (BID) | ORAL | 0 refills | Status: DC
  Filled 2021-04-06: qty 60, 30d supply, fill #0

## 2021-04-06 MED ORDER — OXYCODONE HCL 10 MG PO TABS
10.0000 mg | ORAL_TABLET | Freq: Four times a day (QID) | ORAL | 0 refills | Status: AC | PRN
  Filled 2021-04-06: qty 60, 15d supply, fill #0

## 2021-04-08 ENCOUNTER — Other Ambulatory Visit: Payer: Self-pay

## 2021-04-08 ENCOUNTER — Emergency Department (HOSPITAL_COMMUNITY)
Admission: EM | Admit: 2021-04-08 | Discharge: 2021-04-08 | Disposition: A | Discharge status: Home / Self Care | Payer: Medicare HMO | Attending: Emergency Medicine | Admitting: Emergency Medicine

## 2021-04-08 ENCOUNTER — Encounter (HOSPITAL_COMMUNITY): Payer: Self-pay

## 2021-04-08 DIAGNOSIS — D5700 Hb-SS disease with crisis, unspecified: Secondary | ICD-10-CM | POA: Diagnosis not present

## 2021-04-08 DIAGNOSIS — R000 Tachycardia, unspecified: Secondary | ICD-10-CM | POA: Diagnosis not present

## 2021-04-08 DIAGNOSIS — M79604 Pain in right leg: Secondary | ICD-10-CM | POA: Diagnosis present

## 2021-04-08 LAB — CBC WITH DIFFERENTIAL/PLATELET
Abs Immature Granulocytes: 0.08 10*3/uL — ABNORMAL HIGH (ref 0.00–0.07)
Basophils Absolute: 0 10*3/uL (ref 0.0–0.1)
Basophils Relative: 0 %
Eosinophils Absolute: 0.4 10*3/uL (ref 0.0–0.5)
Eosinophils Relative: 2 %
HCT: 20.5 % — ABNORMAL LOW (ref 39.0–52.0)
Hemoglobin: 7.4 g/dL — ABNORMAL LOW (ref 13.0–17.0)
Immature Granulocytes: 0 %
Lymphocytes Relative: 48 %
Lymphs Abs: 10.1 10*3/uL — ABNORMAL HIGH (ref 0.7–4.0)
MCH: 37.4 pg — ABNORMAL HIGH (ref 26.0–34.0)
MCHC: 36.1 g/dL — ABNORMAL HIGH (ref 30.0–36.0)
MCV: 103.5 fL — ABNORMAL HIGH (ref 80.0–100.0)
Monocytes Absolute: 1.5 10*3/uL — ABNORMAL HIGH (ref 0.1–1.0)
Monocytes Relative: 7 %
Neutro Abs: 9.1 10*3/uL — ABNORMAL HIGH (ref 1.7–7.7)
Neutrophils Relative %: 43 %
Platelets: 269 10*3/uL (ref 150–400)
RBC: 1.98 MIL/uL — ABNORMAL LOW (ref 4.22–5.81)
RDW: 24.3 % — ABNORMAL HIGH (ref 11.5–15.5)
WBC: 21.2 10*3/uL — ABNORMAL HIGH (ref 4.0–10.5)
nRBC: 0 % (ref 0.0–0.2)

## 2021-04-08 LAB — COMPREHENSIVE METABOLIC PANEL
ALT: 32 U/L (ref 0–44)
AST: 38 U/L (ref 15–41)
Albumin: 3.5 g/dL (ref 3.5–5.0)
Alkaline Phosphatase: 182 U/L — ABNORMAL HIGH (ref 38–126)
Anion gap: 9 (ref 5–15)
BUN: 47 mg/dL — ABNORMAL HIGH (ref 6–20)
CO2: 24 mmol/L (ref 22–32)
Calcium: 8 mg/dL — ABNORMAL LOW (ref 8.9–10.3)
Chloride: 105 mmol/L (ref 98–111)
Creatinine, Ser: 3.75 mg/dL — ABNORMAL HIGH (ref 0.61–1.24)
GFR, Estimated: 20 mL/min — ABNORMAL LOW (ref >60)
Glucose, Bld: 106 mg/dL — ABNORMAL HIGH (ref 70–99)
Potassium: 3.9 mmol/L (ref 3.5–5.1)
Sodium: 138 mmol/L (ref 135–145)
Total Bilirubin: 1.7 mg/dL — ABNORMAL HIGH (ref 0.3–1.2)
Total Protein: 6.9 g/dL (ref 6.5–8.1)

## 2021-04-08 LAB — RETICULOCYTES
Immature Retic Fract: 23.5 % — ABNORMAL HIGH (ref 2.3–15.9)
RBC.: 1.94 MIL/uL — ABNORMAL LOW (ref 4.22–5.81)
Retic Count, Absolute: 173 10*3/uL (ref 19.0–186.0)
Retic Ct Pct: 8.9 % — ABNORMAL HIGH (ref 0.4–3.1)

## 2021-04-08 MED ORDER — SODIUM CHLORIDE 0.45 % IV SOLN: INTRAVENOUS | Status: DC

## 2021-04-08 MED ORDER — HYDROMORPHONE HCL 2 MG/ML IJ SOLN
2.0000 mg | INTRAMUSCULAR | Status: AC
  Administered 2021-04-08: 2 mg via INTRAVENOUS
  Filled 2021-04-08: qty 1

## 2021-04-08 MED ORDER — ONDANSETRON HCL 4 MG/2ML IJ SOLN
4.0000 mg | INTRAMUSCULAR | Status: DC | PRN
  Administered 2021-04-08: 4 mg via INTRAVENOUS
  Filled 2021-04-08: qty 2

## 2021-04-08 MED ORDER — DIPHENHYDRAMINE HCL 25 MG PO CAPS
25.0000 mg | ORAL_CAPSULE | ORAL | Status: DC | PRN
  Administered 2021-04-08: 25 mg via ORAL
  Filled 2021-04-08: qty 1

## 2021-04-08 NOTE — Discharge Instructions (Addendum)
You were seen today for a sickle cell crisis. Your pain improved with IV pain medication. You were offered consideration for admission but declined at this time. I recommend resuming your home medication regimen. You do have an increased white blood cell count, decreased hemoglobin, decreased red blood cell count. This could be due to your sickle cell disease or could be from other unknown causes. I recommend follow up with the sickle cell clinic. If you develop life threatening conditions such as chest pain, shortness of breath, or altered level of consciousness, you should return to the emergency department immediately.

## 2021-04-08 NOTE — ED Provider Notes (Signed)
[place] DEPT Provider Note   CSN: [id number] Arrival date & time: 04/08/21  1829     History  No chief complaint on file.   [name] [name]. is a 41 y.o. male. The patient presents to the emergency department complaining of pain in bilateral legs, bilateral arms, and lower back due to sickle cell disease. He states that the pain increased in severity yesterday afternoon. He has taken his home medication regimen including Xtampza ER 18mg  BID and Oxycodone 10mg  q6 with little relief. The patient denies chest pain, shortness of breath, nausea, vomiting. PMH significant for sickle cell crisis, CKD stage IV, cocaine abuse, chest pain with high risk of ACS, leukocytosis, tachycardia with heart rate 121-140 BPM with ambulation, chronic pain syndrome.  HPI     Home Medications Prior to Admission medications   Medication Sig Start Date End Date Taking? Authorizing Provider  acetaminophen (TYLENOL) 650 MG CR tablet Take 650 mg by mouth every 8 (eight) hours as needed for pain.    [provider]  calcitRIOL (ROCALTROL) 0.25 MCG capsule Take 0.25 mcg by mouth daily. 11/16/20   [provider]  Deferiprone, Twice Daily, (FERRIPROX TWICE-A-DAY) 1000 MG TABS Take 1,000 mg by mouth 2 (two) times daily.    [provider]  folic acid (FOLVITE) 1 MG tablet TAKE 1 TABLET BY MOUTH EVERY DAY Patient taking differently: Take 1 mg by mouth daily. 03/22/20   [name], FNP  hydroxyurea (HYDREA) 500 MG capsule Take 500 mg by mouth daily. 05/07/20   [provider]  LOKELMA 10 g PACK packet Take 10 g by mouth every other day. 03/18/20   [provider]  nortriptyline (PAMELOR) 25 MG capsule Take 25 mg by mouth at bedtime as needed for sleep. 09/07/20   [provider]  OXBRYTA 500 MG TABS tablet Take 1,500 mg by mouth daily. 03/26/20   [provider]  oxyCODONE ER (XTAMPZA ER) 18 MG C12A Take 1 capsule by  mouth every 12 (twelve) hours. 04/06/21 05/06/21  [name], MD  Oxycodone HCl 10 MG TABS Take 1 tablet by mouth every 6 (six) hours as needed for up to 15 days. 04/06/21 04/21/21  [name], MD  sodium bicarbonate 650 MG tablet Take 1,950 mg by mouth 3 (three) times daily. 11/07/18   [provider]      Allergies    Nsaids and Morphine and related    Review of Systems   Review of Systems  Eyes:  Negative for visual disturbance.  Respiratory:  Negative for shortness of breath.   Cardiovascular:  Negative for chest pain.  Gastrointestinal:  Negative for abdominal pain, nausea and vomiting.  Musculoskeletal:  Positive for arthralgias, back pain and myalgias.  Neurological:  Negative for headaches.   Physical Exam Updated Vital Signs BP 115/80    Pulse (!) 114    Temp 99.4 F (37.4 C) (Oral)    Resp 15    Ht 5\' 3"  (1.6 m)    SpO2 98%    BMI 19.80 kg/m  Physical Exam Constitutional:      General: He is not in acute distress. HENT:     Head: Normocephalic and atraumatic.  Eyes:     Conjunctiva/sclera: Conjunctivae normal.  Cardiovascular:     Rate and Rhythm: Regular rhythm. Tachycardia present.     Pulses: Normal pulses.     Heart sounds: Normal heart sounds.  Pulmonary:     Effort: Pulmonary  effort is normal. No respiratory distress.     Breath sounds: Normal breath sounds.  Musculoskeletal:        General: Tenderness present. Normal range of motion.  Skin:    General: Skin is warm and dry.  Neurological:     Mental Status: He is alert.    ED Results / Procedures / Treatments   Labs (all labs ordered are listed, but only abnormal results are displayed) Labs Reviewed  RETICULOCYTES - Abnormal; Notable for the following components:      Result Value   Retic Ct Pct 8.9 (*)    RBC. 1.94 (*)    Immature Retic Fract 23.5 (*)    All other components within normal limits  CBC WITH DIFFERENTIAL/PLATELET - Abnormal; Notable for the following  components:   WBC 21.2 (*)    RBC 1.98 (*)    Hemoglobin 7.4 (*)    HCT 20.5 (*)    MCV 103.5 (*)    MCH 37.4 (*)    MCHC 36.1 (*)    RDW 24.3 (*)    Neutro Abs 9.1 (*)    Lymphs Abs 10.1 (*)    Monocytes Absolute 1.5 (*)    Abs Immature Granulocytes 0.08 (*)    All other components within normal limits  COMPREHENSIVE METABOLIC PANEL - Abnormal; Notable for the following components:   Glucose, Bld 106 (*)    BUN 47 (*)    Creatinine, Ser 3.75 (*)    Calcium 8.0 (*)    Alkaline Phosphatase 182 (*)    Total Bilirubin 1.7 (*)    GFR, Estimated 20 (*)    All other components within normal limits  URINALYSIS, ROUTINE W REFLEX MICROSCOPIC    EKG None  Radiology No results found.  Procedures .1-3 Lead EKG Interpretation Performed by: Dorothyann Peng, PA Authorized by: Dorothyann Peng, PA     Interpretation: normal     ECG rate assessment: normal     Rhythm: sinus tachycardia     Ectopy: none     Conduction: normal      Medications Ordered in ED Medications  0.45 % sodium chloride infusion ( Intravenous New Bag/Given 04/08/21 0839)  diphenhydrAMINE (BENADRYL) capsule 25-50 mg (25 mg Oral Given 04/08/21 0842)  ondansetron (ZOFRAN) injection 4 mg (4 mg Intravenous Given 04/08/21 0842)  HYDROmorphone (DILAUDID) injection 2 mg (2 mg Intravenous Given 04/08/21 0838)  HYDROmorphone (DILAUDID) injection 2 mg (2 mg Intravenous Given 04/08/21 0914)  HYDROmorphone (DILAUDID) injection 2 mg (2 mg Intravenous Given 04/08/21 7741)    ED Course/ Medical Decision Making/ A&P                           Medical Decision Making Amount and/or Complexity of Data Reviewed Labs: ordered.  Risk Prescription drug management.   This patient presents to the ED for concern of sickle cell crisis, this involves an extensive number of treatment options, and is a complaint that carries with it a high risk of complications and morbidity.  The differential diagnosis includes but is not limited  to vaso-occlusive pain crisis, bony infarction, infection, and others   Co morbidities that complicate the patient evaluation  Polysubstance abuse history, chronic pain syndrome, sickle cell crisis history   Additional history obtained:  External records from outside source obtained and reviewed including EMR notes including ED note from 02/06/2021   Lab Tests:  I Ordered, and personally interpreted labs.  The pertinent results  include:  WBC 21.2, HgB 7.4, creatinine 3.75, retic 8.9, RBC 1.94   Cardiac Monitoring:  The patient was maintained on a cardiac monitor.  I personally viewed and interpreted the cardiac monitored which showed an underlying rhythm of: sinus tachycardia   Medicines ordered and prescription drug management:  I ordered medication including dilaudid for pain, benadryl, zofran  Reevaluation of the patient after these medicines showed that the patient improved I have reviewed the patients home medicines and have made adjustments as needed   Test Considered:  PT/INR    Consultations Obtained:  I requested consultation with [place] Hollis, NP, sickle cell center, and discussed lab findings as well as pertinent plan - they recommend: Dilaudid and clinic follow up Monday     Reevaluation:  After the interventions noted above, I reevaluated the patient and found that they have :improved   Dispostion:  After consideration of the diagnostic results and the patients response to treatment, I feel that the patent would benefit from discharge home. The patient adamantly stated upon arrival that he did not want to stay at the hospital. His pain improved after medication administration.  He does have worsening hemoglobin, elevated WBC, worsening reticulocyte count, worsening RBC. The patient is also mildly tachycardic but this is consistent with previous visits. I discussed consideration of possible admission but the patient has no interest. I advised him to  follow up with the sickle cell clinic on Monday as discussed with [place] Hollis, NP. The patient's worsening lab values could be sequelae of sickle cell or could be from other causes. The patient would like to discharge home. He will resume his home medication regimen. Strict return precautions provided.    Final Clinical Impression(s) / ED Diagnoses Final diagnoses:  Sickle cell disease with crisis [facility])    Rx / [place] Orders ED Discharge Orders     None         Dorothyann Peng, [place] 04/08/21 1051    Milton Ferguson, MD 04/08/21 [phone]

## 2021-04-08 NOTE — ED Triage Notes (Signed)
Patient states Sickle cell pain began yesterday. Patient states the weather triggered the sickle cell crisis. Patient states the pain is in his arm and lower back. Patient took oxycodone 10 mg without relief.

## 2021-04-11 ENCOUNTER — Encounter (HOSPITAL_COMMUNITY): Payer: Self-pay

## 2021-04-11 ENCOUNTER — Observation Stay (HOSPITAL_COMMUNITY): Payer: Medicare HMO

## 2021-04-11 ENCOUNTER — Emergency Department (HOSPITAL_COMMUNITY): Payer: Medicare HMO

## 2021-04-11 ENCOUNTER — Other Ambulatory Visit: Payer: Self-pay

## 2021-04-11 ENCOUNTER — Observation Stay (HOSPITAL_COMMUNITY)
Admission: EM | Admit: 2021-04-11 | Discharge: 2021-04-12 | Disposition: A | Discharge status: Home / Self Care | Payer: Medicare HMO | Attending: Internal Medicine | Admitting: Internal Medicine

## 2021-04-11 DIAGNOSIS — N184 Chronic kidney disease, stage 4 (severe): Secondary | ICD-10-CM | POA: Diagnosis not present

## 2021-04-11 DIAGNOSIS — M25561 Pain in right knee: Secondary | ICD-10-CM

## 2021-04-11 DIAGNOSIS — Z79899 Other long term (current) drug therapy: Secondary | ICD-10-CM | POA: Insufficient documentation

## 2021-04-11 DIAGNOSIS — F17200 Nicotine dependence, unspecified, uncomplicated: Secondary | ICD-10-CM | POA: Diagnosis present

## 2021-04-11 DIAGNOSIS — F17210 Nicotine dependence, cigarettes, uncomplicated: Secondary | ICD-10-CM | POA: Diagnosis not present

## 2021-04-11 DIAGNOSIS — Z20822 Contact with and (suspected) exposure to covid-19: Secondary | ICD-10-CM | POA: Insufficient documentation

## 2021-04-11 DIAGNOSIS — N179 Acute kidney failure, unspecified: Secondary | ICD-10-CM | POA: Diagnosis not present

## 2021-04-11 DIAGNOSIS — D649 Anemia, unspecified: Secondary | ICD-10-CM | POA: Diagnosis not present

## 2021-04-11 DIAGNOSIS — D72829 Elevated white blood cell count, unspecified: Secondary | ICD-10-CM | POA: Diagnosis not present

## 2021-04-11 DIAGNOSIS — D5700 Hb-SS disease with crisis, unspecified: Principal | ICD-10-CM | POA: Insufficient documentation

## 2021-04-11 DIAGNOSIS — R079 Chest pain, unspecified: Secondary | ICD-10-CM | POA: Diagnosis not present

## 2021-04-11 DIAGNOSIS — D638 Anemia in other chronic diseases classified elsewhere: Secondary | ICD-10-CM | POA: Diagnosis present

## 2021-04-11 LAB — RESP PANEL BY RT-PCR (FLU A&B, COVID) ARPGX2
Influenza A by PCR: NEGATIVE
Influenza B by PCR: NEGATIVE
SARS Coronavirus 2 by RT PCR: NEGATIVE

## 2021-04-11 LAB — CBC WITH DIFFERENTIAL/PLATELET
Abs Immature Granulocytes: 0.08 10*3/uL — ABNORMAL HIGH (ref 0.00–0.07)
Basophils Absolute: 0 10*3/uL (ref 0.0–0.1)
Basophils Relative: 0 %
Eosinophils Absolute: 0.5 10*3/uL (ref 0.0–0.5)
Eosinophils Relative: 3 %
HCT: 15.6 % — ABNORMAL LOW (ref 39.0–52.0)
Hemoglobin: 5.6 g/dL — CL (ref 13.0–17.0)
Immature Granulocytes: 1 %
Lymphocytes Relative: 40 %
Lymphs Abs: 6.8 10*3/uL — ABNORMAL HIGH (ref 0.7–4.0)
MCH: 37.1 pg — ABNORMAL HIGH (ref 26.0–34.0)
MCHC: 35.9 g/dL (ref 30.0–36.0)
MCV: 103.3 fL — ABNORMAL HIGH (ref 80.0–100.0)
Monocytes Absolute: 1.9 10*3/uL — ABNORMAL HIGH (ref 0.1–1.0)
Monocytes Relative: 11 %
Neutro Abs: 7.7 10*3/uL (ref 1.7–7.7)
Neutrophils Relative %: 45 %
Platelets: 310 10*3/uL (ref 150–400)
RBC: 1.51 MIL/uL — ABNORMAL LOW (ref 4.22–5.81)
WBC: 17 10*3/uL — ABNORMAL HIGH (ref 4.0–10.5)
nRBC: 0.1 % (ref 0.0–0.2)

## 2021-04-11 LAB — COMPREHENSIVE METABOLIC PANEL
ALT: 44 U/L (ref 0–44)
AST: 56 U/L — ABNORMAL HIGH (ref 15–41)
Albumin: 3.4 g/dL — ABNORMAL LOW (ref 3.5–5.0)
Alkaline Phosphatase: 216 U/L — ABNORMAL HIGH (ref 38–126)
Anion gap: 7 (ref 5–15)
BUN: 47 mg/dL — ABNORMAL HIGH (ref 6–20)
CO2: 21 mmol/L — ABNORMAL LOW (ref 22–32)
Calcium: 8.4 mg/dL — ABNORMAL LOW (ref 8.9–10.3)
Chloride: 106 mmol/L (ref 98–111)
Creatinine, Ser: 3.53 mg/dL — ABNORMAL HIGH (ref 0.61–1.24)
GFR, Estimated: 22 mL/min — ABNORMAL LOW (ref >60)
Glucose, Bld: 137 mg/dL — ABNORMAL HIGH (ref 70–99)
Potassium: 4.5 mmol/L (ref 3.5–5.1)
Sodium: 134 mmol/L — ABNORMAL LOW (ref 135–145)
Total Bilirubin: 2 mg/dL — ABNORMAL HIGH (ref 0.3–1.2)
Total Protein: 7 g/dL (ref 6.5–8.1)

## 2021-04-11 LAB — RETICULOCYTES
Immature Retic Fract: 24.1 % — ABNORMAL HIGH (ref 2.3–15.9)
RBC.: 1.55 MIL/uL — ABNORMAL LOW (ref 4.22–5.81)
Retic Count, Absolute: 251.5 10*3/uL — ABNORMAL HIGH (ref 19.0–186.0)
Retic Ct Pct: 16.2 % — ABNORMAL HIGH (ref 0.4–3.1)

## 2021-04-11 LAB — PREPARE RBC (CROSSMATCH)

## 2021-04-11 MED ORDER — SODIUM CHLORIDE 0.9% FLUSH: 9.0000 mL | INTRAVENOUS | Status: DC | PRN

## 2021-04-11 MED ORDER — HYDROMORPHONE 1 MG/ML IV SOLN
INTRAVENOUS | Status: DC
  Administered 2021-04-11: 30 mg via INTRAVENOUS
  Administered 2021-04-12: 3.5 mg via INTRAVENOUS
  Administered 2021-04-12: 2 mg via INTRAVENOUS
  Administered 2021-04-12: 2.5 mg via INTRAVENOUS
  Administered 2021-04-12: 3 mg via INTRAVENOUS
  Filled 2021-04-11: qty 30

## 2021-04-11 MED ORDER — SODIUM BICARBONATE 650 MG PO TABS
1950.0000 mg | ORAL_TABLET | Freq: Three times a day (TID) | ORAL | Status: DC
  Administered 2021-04-11 – 2021-04-12 (×3): 1950 mg via ORAL
  Filled 2021-04-11 (×3): qty 3

## 2021-04-11 MED ORDER — DIPHENHYDRAMINE HCL 25 MG PO CAPS: 25.0000 mg | ORAL_CAPSULE | ORAL | Status: DC | PRN

## 2021-04-11 MED ORDER — HYDROMORPHONE HCL 1 MG/ML IJ SOLN: 1.0000 mg | INTRAMUSCULAR | Status: DC | PRN

## 2021-04-11 MED ORDER — FOLIC ACID 1 MG PO TABS
1.0000 mg | ORAL_TABLET | Freq: Every day | ORAL | Status: DC
  Administered 2021-04-12: 1 mg via ORAL
  Filled 2021-04-11: qty 1

## 2021-04-11 MED ORDER — OXYCODONE HCL ER 20 MG PO T12A
20.0000 mg | EXTENDED_RELEASE_TABLET | Freq: Two times a day (BID) | ORAL | Status: DC
  Administered 2021-04-11 – 2021-04-12 (×3): 20 mg via ORAL
  Filled 2021-04-11 (×3): qty 1

## 2021-04-11 MED ORDER — HYDROMORPHONE HCL 2 MG/ML IJ SOLN
2.0000 mg | INTRAMUSCULAR | Status: AC
  Administered 2021-04-11: 2 mg via INTRAVENOUS
  Filled 2021-04-11: qty 1

## 2021-04-11 MED ORDER — ONDANSETRON HCL 4 MG/2ML IJ SOLN: 4.0000 mg | Freq: Four times a day (QID) | INTRAMUSCULAR | Status: DC | PRN

## 2021-04-11 MED ORDER — HYDROMORPHONE HCL 1 MG/ML IJ SOLN
2.0000 mg | INTRAMUSCULAR | Status: DC | PRN
  Administered 2021-04-11 – 2021-04-12 (×5): 2 mg via INTRAVENOUS
  Filled 2021-04-11 (×5): qty 2

## 2021-04-11 MED ORDER — SODIUM CHLORIDE 0.9 % IV SOLN: 10.0000 mL/h | Freq: Once | INTRAVENOUS | Status: DC

## 2021-04-11 MED ORDER — OXYCODONE HCL 5 MG PO TABS
10.0000 mg | ORAL_TABLET | ORAL | Status: DC | PRN
  Administered 2021-04-12: 10 mg via ORAL
  Filled 2021-04-11: qty 2

## 2021-04-11 MED ORDER — DEFERIPRONE (TWICE DAILY) 1000 MG PO TABS
1000.0000 mg | ORAL_TABLET | Freq: Two times a day (BID) | ORAL | Status: DC
  Administered 2021-04-12: 1000 mg via ORAL

## 2021-04-11 MED ORDER — VOXELOTOR 500 MG PO TABS
1500.0000 mg | ORAL_TABLET | Freq: Every day | ORAL | Status: DC
Start: 2021-04-11 — End: 2021-04-12
  Administered 2021-04-12: 1500 mg via ORAL

## 2021-04-11 MED ORDER — SODIUM ZIRCONIUM CYCLOSILICATE 10 G PO PACK
10.0000 g | PACK | ORAL | Status: DC
Start: 2021-04-11 — End: 2021-04-11

## 2021-04-11 MED ORDER — SENNOSIDES-DOCUSATE SODIUM 8.6-50 MG PO TABS: 1.0000 | ORAL_TABLET | Freq: Two times a day (BID) | ORAL | Status: DC

## 2021-04-11 MED ORDER — CALCITRIOL 0.25 MCG PO CAPS
0.2500 ug | ORAL_CAPSULE | Freq: Every day | ORAL | Status: DC
  Administered 2021-04-12: 0.25 ug via ORAL
  Filled 2021-04-11 (×2): qty 1

## 2021-04-11 MED ORDER — AMITRIPTYLINE HCL 25 MG PO TABS
25.0000 mg | ORAL_TABLET | Freq: Every day | ORAL | Status: DC
  Administered 2021-04-11: 50 mg via ORAL
  Filled 2021-04-11: qty 2

## 2021-04-11 MED ORDER — POLYETHYLENE GLYCOL 3350 17 G PO PACK: 17.0000 g | PACK | Freq: Every day | ORAL | Status: DC | PRN

## 2021-04-11 MED ORDER — SODIUM ZIRCONIUM CYCLOSILICATE 10 G PO PACK
10.0000 g | PACK | ORAL | Status: DC
  Administered 2021-04-12: 10 g via ORAL
  Filled 2021-04-11: qty 1

## 2021-04-11 MED ORDER — NALOXONE HCL 0.4 MG/ML IJ SOLN: 0.4000 mg | INTRAMUSCULAR | Status: DC | PRN

## 2021-04-11 MED ORDER — SODIUM CHLORIDE 0.45 % IV SOLN: INTRAVENOUS | Status: DC

## 2021-04-11 NOTE — ED Triage Notes (Signed)
Patient c/o sickle cell pain in bilateral legs x 3 days.

## 2021-04-11 NOTE — H&P (Addendum)
H&P  Patient Demographics:  [name] [name], is a 41 y.o. male  MRN: [id number]   DOB - 01/08/1981  Admit Date - 04/11/2021  Outpatient Primary MD for the patient is [name], FNP  Chief Complaint  Patient presents with   Sickle Cell Pain Crisis      HPI:   [name] [name]  is a 41 y.o. male with a medical history significant for sickle cell disease, chronic kidney disease stage IV, chronic pain syndrome, tobacco abuse, opiate dependence and tolerance, history of polysubstance abuse, and history of migraine headaches who presents to the emergency department with worsening bilateral knee pain that is consistent with his previous sickle cell crisis.  Patient states that pain intensity has been increasing over the past several days.  He has been taking home oxycodone and Xtampza as prescribed without any relief.  His pain intensity is 9/10 characterized as constant and throbbing.  Patient states that ambulation and transitioning from sitting to standing worsens knee pain.  Patient states that he previously received injections to his right knee at [facility] pain clinic, but has not followed up over the past 6 months or so.  He currently denies any headache, chest pain, urinary symptoms, nausea, vomiting, or diarrhea.  No sick contacts, recent travel, or known exposure to COVID-19.  Patient endorses fatigue.   ER course:  Vital signs show: BP 107/74    Pulse 100    Temp 98.9 F (37.2 C)    Resp 17    Ht 5\' 3"  (1.6 m)    Wt 50.7 kg    SpO2 99%    BMI 19.80 kg/m  While in the ER, patient found to have a hemoglobin of 5.6 g/dL, which is below his baseline.  WBCs 17.0, and platelets 310,000.  Complete metabolic panel shows sodium 134, chloride 106, potassium 4.5, glucose 137.  The patient has a history of stage IV kidney disease, creatinine is 3.53, which is above his baseline of 2-3.  Absolute reticulocytes 251.5.  Chest x-ray shows no acute cardiopulmonary process.  Blood transfusion initiated  in emergency department.  Patient admitted in observation for symptomatic anemia in the setting of sickle cell pain crisis.   Review of systems:  In addition to the HPI above, patient reports No fever or chills No Headache, No changes with vision or hearing No problems swallowing food or liquids No chest pain, cough or shortness of breath No abdominal pain, No nausea or vomiting, Bowel movements are regular No blood in stool or urine No dysuria No new skin rashes or bruises No new joints pains-aches No new weakness, tingling, numbness in any extremity No recent weight gain or loss No polyuria, polydypsia or polyphagia No significant Mental Stressors  With Past History of the following :   Past Medical History:  Diagnosis Date   Abscess of right iliac fossa 09/24/2018   required Perc Drain 09/24/2018   Arachnoid Cyst of brain bilaterally    "2 really small ones in the back of my head; inside; saw them w/MRI" (09/25/2012)   Bacterial pneumonia ~ 2012   "caught it here in the hospital" (09/25/2012)   Chronic kidney disease    "from my sickle cell" (09/25/2012)   CKD (chronic kidney disease), stage II    Colitis 04/19/2017   CT scan abd/ pelvis   GERD (gastroesophageal reflux disease)    "after I eat alot of spicey foods" (09/25/2012)   Gynecomastia, male 07/10/2012   History of blood transfusion    "  always related to sickle cell crisis" (09/25/2012)   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   Noted in noted from Hematology notes at [place]    "take RX qd to prevent them" (09/25/2012)   Opioid dependence with withdrawal ([place]) 08/30/2012   Renal insufficiency    Sickle cell anemia (H[facility])    Sickle cell crisis ([place]) 09/25/2012   Sickle cell nephropathy ([place]) 07/10/2012   Sinus tachycardia    Tachycardia with heart rate 121-140 beats per minute with ambulation 08/04/2016      Past Surgical History:  Procedure Laterality Date   ABCESS DRAINAGE      CHOLECYSTECTOMY  ~ 2012   COLONOSCOPY N/A 04/23/2017   Procedure: COLONOSCOPY;  Surgeon: Irene Shipper, MD;  Location: [facility];  Service: Endoscopy;  Laterality: N/A;   IR FLUORO GUIDE CV LINE RIGHT  12/17/2016   IR FLUORO GUIDE CV LINE RIGHT  02/07/2021   IR RADIOLOGIST EVAL & MGMT  10/02/2018   IR RADIOLOGIST EVAL & MGMT  10/15/2018   IR REMOVAL TUN CV CATH W/O FL  12/21/2016   IR REMOVAL TUN CV CATH W/O FL  02/11/2021   IR US GUIDE VASC ACCESS RIGHT  12/17/2016   IR US GUIDE VASC ACCESS RIGHT  02/07/2021   spleenectomy       Social History:   Social History   Tobacco Use   Smoking status: Some Days    Packs/day: 0.10    Types: Cigarettes   Smokeless tobacco: Never   Tobacco comments:    09/25/2012 "I don't buy cigarettes; bum one from friends q now and then"  Substance Use Topics   Alcohol use: Yes    Alcohol/week: 0.0 standard drinks    Comment: Once a month     Lives - At home   Family History :   Family History  Problem Relation Age of Onset   Breast cancer Mother      Home Medications:   Prior to Admission medications   Medication Sig Start Date End Date Taking? Authorizing Provider  acetaminophen (TYLENOL) 650 MG CR tablet Take 650 mg by mouth every 8 (eight) hours as needed for pain.   Yes [provider]  amitriptyline (ELAVIL) 25 MG tablet Take 25-50 mg by mouth at bedtime. For sleep 03/08/21  Yes [provider]  calcitRIOL (ROCALTROL) 0.25 MCG capsule Take 0.25 mcg by mouth daily. 11/16/20  Yes [provider]  Deferiprone, Twice Daily, (FERRIPROX TWICE-A-DAY) 1000 MG TABS Take 1,000 mg by mouth 2 (two) times daily.   Yes [provider]  folic acid (FOLVITE) 1 MG tablet TAKE 1 TABLET BY MOUTH EVERY DAY Patient taking differently: Take 1 mg by mouth daily. 03/22/20  Yes [name], FNP  hydroxyurea (HYDREA) 500 MG capsule Take 500 mg by mouth daily. 05/07/20  Yes [provider]  LOKELMA 10 g PACK packet Take  10 g by mouth every other day. 03/18/20  Yes [provider]  OXBRYTA 500 MG TABS tablet Take 1,500 mg by mouth daily. 03/26/20  Yes [provider]  oxyCODONE ER (XTAMPZA ER) 18 MG C12A Take 1 capsule by mouth every 12 (twelve) hours. 04/06/21 05/06/21 Yes [name], MD  Oxycodone HCl 10 MG TABS Take 1 tablet by mouth every 6 (six) hours as needed for up to 15 days. Patient taking differently: Take 10 mg by mouth every 6 (six) hours as needed (pain). 04/06/21 04/21/21 Yes [name], MD  sodium  bicarbonate 650 MG tablet Take 1,950 mg by mouth 3 (three) times daily. 11/07/18  Yes [provider]     Allergies:   Allergies  Allergen Reactions   Nsaids Other (See Comments)    Kidney disease   Morphine And Related Other (See Comments)    "Real bad headaches"      Physical Exam:   Vitals:   Vitals:   04/11/21 1130 04/11/21 1145  BP: 103/73 107/74  Pulse: (!) 101 100  Resp: 13 17  Temp:    SpO2: 94% 99%    Physical Exam: Constitutional: Patient appears well-developed and well-nourished. Not in obvious distress. HENT: Normocephalic, atraumatic, External right and left ear normal. Oropharynx is clear and moist.  Eyes: Conjunctivae and EOM are normal. PERRLA, no scleral icterus. Neck: Normal ROM. Neck supple. No JVD. No tracheal deviation. No thyromegaly. CVS: RRR, S1/S2 +, no murmurs, no gallops, no carotid bruit.  Pulmonary: Effort and breath sounds normal, no stridor, rhonchi, wheezes, rales.  Abdominal: Soft. BS +, no distension, tenderness, rebound or guarding.  Musculoskeletal: Normal range of motion. No edema and no tenderness.  Lymphadenopathy: No lymphadenopathy noted, cervical, inguinal or axillary Neuro: Alert. Normal reflexes, muscle tone coordination. No cranial nerve deficit. Skin: Skin is warm and dry. No rash noted. Not diaphoretic. No erythema. No pallor. Psychiatric: Normal mood and affect. Behavior, judgment, thought content  normal.   Data Review:   CBC Recent Labs  Lab 04/08/21 0837 04/11/21 0850  WBC 21.2* 17.0*  HGB 7.4* 5.6*  HCT 20.5* 15.6*  PLT 269 310  MCV 103.5* 103.3*  MCH 37.4* 37.1*  MCHC 36.1* 35.9  RDW 24.3* Not Measured  LYMPHSABS 10.1* 6.8*  MONOABS 1.5* 1.9*  EOSABS 0.4 0.5  BASOSABS 0.0 0.0   ------------------------------------------------------------------------------------------------------------------  Chemistries  Recent Labs  Lab 04/08/21 0837 04/11/21 0850  NA 138 134*  K 3.9 4.5  CL 105 106  CO2 24 21*  GLUCOSE 106* 137*  BUN 47* 47*  CREATININE 3.75* 3.53*  CALCIUM 8.0* 8.4*  AST 38 56*  ALT 32 44  ALKPHOS 182* 216*  BILITOT 1.7* 2.0*   ------------------------------------------------------------------------------------------------------------------ estimated creatinine clearance is 20.3 mL/min (A) (by C-G formula based on SCr of 3.53 mg/dL (H)). ------------------------------------------------------------------------------------------------------------------ No results for input(s): TSH, T4TOTAL, T3FREE, THYROIDAB in the last 72 hours.  Invalid input(s): FREET3  Coagulation profile No results for input(s): INR, PROTIME in the last 168 hours. ------------------------------------------------------------------------------------------------------------------- No results for input(s): DDIMER in the last 72 hours. -------------------------------------------------------------------------------------------------------------------  Cardiac Enzymes No results for input(s): CKMB, TROPONINI, MYOGLOBIN in the last 168 hours.  Invalid input(s): CK ------------------------------------------------------------------------------------------------------------------ No results found for: BNP  ---------------------------------------------------------------------------------------------------------------  Urinalysis    Component Value Date/Time   COLORURINE YELLOW  02/07/2021 [place] 02/07/2021 0519   LABSPEC 1.010 02/07/2021 0519   PHURINE 8.0 02/07/2021 0519   GLUCOSEU NEGATIVE 02/07/2021 0519   HGBUR LARGE (A) 02/07/2021 0519   HGBUR small 03/10/2010 [place] 02/07/2021 0519   BILIRUBINUR negative 01/11/2021 1024   BILIRUBINUR neg 09/30/2020 1117   KETONESUR NEGATIVE 02/07/2021 0519   PROTEINUR 100 (A) 02/07/2021 0519   UROBILINOGEN 0.2 01/11/2021 1024   UROBILINOGEN 0.2 05/11/2017 1335   NITRITE NEGATIVE 02/07/2021 0519   LEUKOCYTESUR NEGATIVE 02/07/2021 0519    ----------------------------------------------------------------------------------------------------------------   Imaging Results:    DG Chest Portable 1 View  Result Date: 04/11/2021 CLINICAL DATA:  Chest pain, sickle cell EXAM: PORTABLE CHEST 1 VIEW COMPARISON:  02/06/2021 FINDINGS: Chronic mild  interstitial changes. No new consolidation. No pleural effusion or pneumothorax. Stable cardiomediastinal contours. IMPRESSION: No acute process in the chest. Electronically Signed   By: Macy Mis M.D.   On: 04/11/2021 10:19     Assessment & Plan:  Active Problems:   Sickle cell pain crisis ([place])  Symptomatic anemia: Patient endorses fatigue on admission.  Transfused 2 units PRBCs. Patient is followed closely by his hematology team at [facility]  He was previously receiving Aranesp injections every other week, however his hemoglobin had previously been elevated so injections have been on hold.  He has been taking [place] consistently. Follow labs in AM.  Sickle cell disease with pain crisis: Continue IV fluids at [facility] Restart home medications, oxycodone 15 mg every 4 hours as needed for severe breakthrough pain OxyContin 15 mg every 12 hours Monitor vital signs very closely, reevaluate pain scale regularly, and supplemental oxygen as needed Patient will be reevaluated for pain in the context of function and relationship  to baseline as care progresses.  Leukocytosis: WBCs 17.  Patient afebrile.  Chest x-ray shows no acute cardiopulmonary process.  Urinalysis pending.  Follow labs in AM.  Right knee pain: Patient reports worsening right knee pain.  Review right knee x-ray as results become available.  Chronic pain syndrome: Continue home medications  Acute kidney injury superimposed on chronic kidney disease stage IV: Creatinine 3.53, above patient's baseline of 2-3.  Gentle hydration.  Avoid all nephrotoxins.  He is followed by nephrology as an outpatient.  Tobacco abuse: Patient counseled extensively on the dangers of smoking especially in the setting of sickle cell disease.  Nicotine patches declined.  DVT Prophylaxis: Subcut Lovenox   AM Labs Ordered, also please review Full Orders  Family Communication: Admission, patient's condition and plan of care including tests being ordered have been discussed with the patient who indicate understanding and agree with the plan and Code Status.  Code Status: Full Code  Consults called: None    Admission status: Inpatient    Time spent in minutes : 50 minutes [place], MSN, FNP-C [facility] [address] [place], [place] [address] [phone]  04/11/2021 at 11:55 AM

## 2021-04-11 NOTE — ED Provider Notes (Signed)
[facility] DEPT Provider Note   CSN: [id number] Arrival date & time: 04/11/21  2993     History  Chief Complaint  Patient presents with   Sickle Cell Pain Crisis    [name] [name]. is a 41 y.o. male.   Sickle Cell Pain Crisis Associated symptoms: no shortness of breath   Patient presents sickle cell pain.  Known sickle cell SS disease.  On oxycodone and OxyContin at home.  Get seen both at [facility] and at the sickle cell clinic here.  Pain for last few days.  Patient states it is his legs which is the typical spot for him.  States he has more swelling.  Recently seen in the ER for same.  States his creatinine gone up.  Has chronic kidney disease due to the sickle cell.  No difficulty breathing.  No cough.  States he has more swelling in his legs.  Patient states he hopes to not be admitted to the hospital and would rather stay here in the ER then go to the sickle cell clinic.    Home Medications Prior to Admission medications   Medication Sig Start Date End Date Taking? Authorizing Provider  acetaminophen (TYLENOL) 650 MG CR tablet Take 650 mg by mouth every 8 (eight) hours as needed for pain.   Yes [provider]  amitriptyline (ELAVIL) 25 MG tablet Take 25-50 mg by mouth at bedtime. For sleep 03/08/21  Yes [provider]  calcitRIOL (ROCALTROL) 0.25 MCG capsule Take 0.25 mcg by mouth daily. 11/16/20  Yes [provider]  Deferiprone, Twice Daily, (FERRIPROX TWICE-A-DAY) 1000 MG TABS Take 1,000 mg by mouth 2 (two) times daily.   Yes [provider]  folic acid (FOLVITE) 1 MG tablet TAKE 1 TABLET BY MOUTH EVERY DAY Patient taking differently: Take 1 mg by mouth daily. 03/22/20  Yes [name], FNP  hydroxyurea (HYDREA) 500 MG capsule Take 500 mg by mouth daily. 05/07/20  Yes [provider]  LOKELMA 10 g PACK packet Take 10 g by mouth every other day. 03/18/20  Yes [provider]  OXBRYTA 500  MG TABS tablet Take 1,500 mg by mouth daily. 03/26/20  Yes [provider]  oxyCODONE ER (XTAMPZA ER) 18 MG C12A Take 1 capsule by mouth every 12 (twelve) hours. 04/06/21 05/06/21 Yes [name], MD  Oxycodone HCl 10 MG TABS Take 1 tablet by mouth every 6 (six) hours as needed for up to 15 days. Patient taking differently: Take 10 mg by mouth every 6 (six) hours as needed (pain). 04/06/21 04/21/21 Yes [name], MD  sodium bicarbonate 650 MG tablet Take 1,950 mg by mouth 3 (three) times daily. 11/07/18  Yes [provider]      Allergies    Nsaids and Morphine and related    Review of Systems   Review of Systems  Constitutional:  Negative for appetite change.  Respiratory:  Negative for shortness of breath.   Cardiovascular:  Positive for leg swelling.  Gastrointestinal:  Negative for abdominal pain.  Neurological:  Negative for weakness.   Physical Exam Updated Vital Signs BP 95/61    Pulse (!) 101    Temp 98.1 F (36.7 C) (Oral)    Resp 12    Ht 5\' 3"  (1.6 m)    Wt 50.7 kg    SpO2 97%    BMI 19.80 kg/m  Physical Exam Vitals and nursing note reviewed.  Cardiovascular:  Rate and Rhythm: Tachycardia present.  Pulmonary:     Breath sounds: No wheezing or rhonchi.  Abdominal:     Tenderness: There is no abdominal tenderness.  Musculoskeletal:     Comments: Mild edema bilateral lower extremities.  Mild tenderness to bilateral knees.  Skin:    Capillary Refill: Capillary refill takes less than 2 seconds.  Neurological:     Mental Status: He is alert and oriented to person, place, and time.    ED Results / Procedures / Treatments   Labs (all labs ordered are listed, but only abnormal results are displayed) Labs Reviewed  CBC WITH DIFFERENTIAL/PLATELET - Abnormal; Notable for the following components:      Result Value   WBC 17.0 (*)    RBC 1.51 (*)    Hemoglobin 5.6 (*)    HCT 15.6 (*)    MCV 103.3 (*)    MCH 37.1 (*)    Lymphs Abs 6.8  (*)    Monocytes Absolute 1.9 (*)    Abs Immature Granulocytes 0.08 (*)    All other components within normal limits  COMPREHENSIVE METABOLIC PANEL - Abnormal; Notable for the following components:   Sodium 134 (*)    CO2 21 (*)    Glucose, Bld 137 (*)    BUN 47 (*)    Creatinine, Ser 3.53 (*)    Calcium 8.4 (*)    Albumin 3.4 (*)    AST 56 (*)    Alkaline Phosphatase 216 (*)    Total Bilirubin 2.0 (*)    GFR, Estimated 22 (*)    All other components within normal limits  RETICULOCYTES - Abnormal; Notable for the following components:   Retic Ct Pct 16.2 (*)    RBC. 1.55 (*)    Retic Count, Absolute 251.5 (*)    Immature Retic Fract 24.1 (*)    All other components within normal limits  RESP PANEL BY RT-PCR (FLU A&B, COVID) ARPGX2  URINALYSIS, COMPLETE (UACMP) WITH MICROSCOPIC  TYPE AND SCREEN  PREPARE RBC (CROSSMATCH)    EKG None  Radiology DG Chest Portable 1 View  Result Date: 04/11/2021 CLINICAL DATA:  Chest pain, sickle cell EXAM: PORTABLE CHEST 1 VIEW COMPARISON:  02/06/2021 FINDINGS: Chronic mild interstitial changes. No new consolidation. No pleural effusion or pneumothorax. Stable cardiomediastinal contours. IMPRESSION: No acute process in the chest. Electronically Signed   By: Macy Mis M.D.   On: 04/11/2021 10:19    Procedures Procedures    Medications Ordered in ED Medications  amitriptyline (ELAVIL) tablet 25-50 mg (has no administration in time range)  calcitRIOL (ROCALTROL) capsule 0.25 mcg (has no administration in time range)  sodium bicarbonate tablet 1,950 mg (has no administration in time range)  folic acid (FOLVITE) tablet 1 mg (has no administration in time range)  voxelotor (OXBRYTA) tablet 1,500 mg (has no administration in time range)  Deferiprone (Twice Daily) TABS 1,000 mg (has no administration in time range)  sodium zirconium cyclosilicate (LOKELMA) packet 10 g (has no administration in time range)  senna-docusate (Senokot-S) tablet  1 tablet (has no administration in time range)  polyethylene glycol (MIRALAX / GLYCOLAX) packet 17 g (has no administration in time range)  oxyCODONE (Oxy IR/ROXICODONE) immediate release tablet 10 mg (has no administration in time range)  oxyCODONE (OXYCONTIN) 12 hr tablet 20 mg (has no administration in time range)  HYDROmorphone (DILAUDID) injection 1 mg (has no administration in time range)  HYDROmorphone (DILAUDID) injection 2 mg (2 mg Intravenous Given 04/11/21 0904)  HYDROmorphone (DILAUDID) injection 2 mg (2 mg Intravenous Given 04/11/21 0940)  HYDROmorphone (DILAUDID) injection 2 mg (2 mg Intravenous Given 04/11/21 1010)    ED Course/ Medical Decision Making/ A&P                           Medical Decision Making Problems Addressed: Anemia, unspecified type: acute illness or injury Sickle cell pain crisis [facility]): acute illness or injury  Amount and/or Complexity of Data Reviewed External Data Reviewed: radiology. Labs: ordered. Radiology: ordered and independent interpretation performed. Decision-making details documented in ED Course.    Details: No CHF.  No pneumonia. ECG/medicine tests: independent interpretation performed.  Risk Prescription drug management. Decision regarding hospitalization.   Patient presents with sickle cell pain.  Recent visit to the ER for the same.  Also feeling somewhat fatigued.  History of hemoglobin SS.  On chronic pain medicines.  Reviewed hematology note from [facility].  Lab work reviewed and showed a worsening anemia.  History of anemia but this is worse.  No fevers.  Creatinine is elevated but it is where it has been recently.  Discussed with [place] Hollis from the sickle cell clinic.  Will admit for transfusion.  Does not have acute chest syndrome.  Admit for pain control and transfusion        Final Clinical Impression(s) / ED Diagnoses Final diagnoses:  Sickle cell pain crisis ([place])  Anemia, unspecified type    Rx / DC Orders ED  Discharge Orders     None         Davonna Belling, MD 04/11/21 1234

## 2021-04-12 ENCOUNTER — Other Ambulatory Visit: Payer: Self-pay | Admitting: Family Medicine

## 2021-04-12 DIAGNOSIS — G8929 Other chronic pain: Secondary | ICD-10-CM

## 2021-04-12 DIAGNOSIS — D5700 Hb-SS disease with crisis, unspecified: Secondary | ICD-10-CM | POA: Diagnosis not present

## 2021-04-12 LAB — RAPID URINE DRUG SCREEN, HOSP PERFORMED
Amphetamines: NOT DETECTED
Barbiturates: NOT DETECTED
Benzodiazepines: NOT DETECTED
Cocaine: NOT DETECTED
Opiates: POSITIVE — AB
Tetrahydrocannabinol: POSITIVE — AB

## 2021-04-12 LAB — TYPE AND SCREEN
ABO/RH(D): O POS
Antibody Screen: POSITIVE
DAT, IgG: POSITIVE
Unit division: 0
Unit division: 0

## 2021-04-12 LAB — BPAM RBC
Blood Product Expiration Date: 202303082359
Blood Product Expiration Date: 202303092359
ISSUE DATE / TIME: 202302271512
ISSUE DATE / TIME: 202302271759
Unit Type and Rh: 5100
Unit Type and Rh: 5100

## 2021-04-12 LAB — URINALYSIS, COMPLETE (UACMP) WITH MICROSCOPIC
Bacteria, UA: NONE SEEN
Bilirubin Urine: NEGATIVE
Glucose, UA: 50 mg/dL — AB
Ketones, ur: 5 mg/dL — AB
Leukocytes,Ua: NEGATIVE
Nitrite: NEGATIVE
Protein, ur: 100 mg/dL — AB
Specific Gravity, Urine: 1.009 (ref 1.005–1.030)
pH: 7 (ref 5.0–8.0)

## 2021-04-12 LAB — BASIC METABOLIC PANEL
Anion gap: 8 (ref 5–15)
BUN: 40 mg/dL — ABNORMAL HIGH (ref 6–20)
CO2: 20 mmol/L — ABNORMAL LOW (ref 22–32)
Calcium: 8.1 mg/dL — ABNORMAL LOW (ref 8.9–10.3)
Chloride: 105 mmol/L (ref 98–111)
Creatinine, Ser: 2.89 mg/dL — ABNORMAL HIGH (ref 0.61–1.24)
GFR, Estimated: 28 mL/min — ABNORMAL LOW (ref >60)
Glucose, Bld: 93 mg/dL (ref 70–99)
Potassium: 5 mmol/L (ref 3.5–5.1)
Sodium: 133 mmol/L — ABNORMAL LOW (ref 135–145)

## 2021-04-12 LAB — CBC
HCT: 25.4 % — ABNORMAL LOW (ref 39.0–52.0)
Hemoglobin: 8.7 g/dL — ABNORMAL LOW (ref 13.0–17.0)
MCH: 33.6 pg (ref 26.0–34.0)
MCHC: 34.3 g/dL (ref 30.0–36.0)
MCV: 98.1 fL (ref 80.0–100.0)
Platelets: 257 10*3/uL (ref 150–400)
RBC: 2.59 MIL/uL — ABNORMAL LOW (ref 4.22–5.81)
RDW: 24.7 % — ABNORMAL HIGH (ref 11.5–15.5)
WBC: 13.6 10*3/uL — ABNORMAL HIGH (ref 4.0–10.5)
nRBC: 0.3 % — ABNORMAL HIGH (ref 0.0–0.2)

## 2021-04-12 NOTE — Discharge Summary (Signed)
Physician Discharge Summary  [name] [name]. SAY:[id number] DOB: 08/02/1980 DOA: 04/11/2021  PCP: [name], FNP  Admit date: 04/11/2021  Discharge date: 04/12/2021  Discharge Diagnoses:  Principal Problem:   Sickle cell pain crisis [facility])   Discharge Condition: Stable  Disposition:  Pt is discharged home in good condition and is to follow up with [name], FNP this week to have labs evaluated. [name] A Ropp Jr. is instructed to increase activity slowly and balance with rest for the next few days, and use prescribed medication to complete treatment of pain  Diet: Regular Wt Readings from Last 3 Encounters:  04/11/21 50.7 kg  02/06/21 50.7 kg  01/11/21 54.3 kg    History of present illness:  [name] [name]  is a 41 y.o. male with a medical history significant for sickle cell disease, chronic kidney disease stage IV, chronic pain syndrome, tobacco abuse, opiate dependence and tolerance, history of polysubstance abuse, and history of migraine headaches who presents to the emergency department with worsening bilateral knee pain that is consistent with his previous sickle cell crisis.  Patient states that pain intensity has been increasing over the past several days.  He has been taking home oxycodone and Xtampza as prescribed without any relief.  His pain intensity is 9/10 characterized as constant and throbbing.  Patient states that ambulation and transitioning from sitting to standing worsens knee pain.  Patient states that he previously received injections to his right knee at [facility] pain clinic, but has not followed up over the past 6 months or so.  He currently denies any headache, chest pain, urinary symptoms, nausea, vomiting, or diarrhea.  No sick contacts, recent travel, or known exposure to COVID-19.  Patient endorses fatigue.   ER course:  Vital signs show: BP 107/74    Pulse 100    Temp 98.9 F (37.2 C)    Resp 17    Ht 5\' 3"  (1.6 m)    Wt 50.7 kg     SpO2 99%    BMI 19.80 kg/m  While in the ER, patient found to have a hemoglobin of 5.6 g/dL, which is below his baseline.  WBCs 17.0, and platelets 310,000.  Complete metabolic panel shows sodium 134, chloride 106, potassium 4.5, glucose 137.  The patient has a history of stage IV kidney disease, creatinine is 3.53, which is above his baseline of 2-3.  Absolute reticulocytes 251.5.  Chest x-ray shows no acute cardiopulmonary process.  Blood transfusion initiated in emergency department.  Patient admitted in observation for symptomatic anemia in the setting of sickle cell pain crisis.  Hospital Course:  Symptomatic anemia: Patient admitted in observation.  While in the emergency department, patient's hemoglobin was 5.6 g/dL, which is below his baseline.  He was transfused 2 units PRBCs without complication.  Prior to discharge, patient's hemoglobin has returned to baseline at 8.7 g/dL.  Patient is scheduled to follow-up with his hematology team in 2 weeks.  Advised to continue taking Oxbryta and folic acid consistently.  Sickle cell disease with pain crisis: patient was admitted for sickle cell pain crisis and managed appropriately with IVF, IV Dilaudid via PCA as well as other adjunct therapies per sickle cell pain management protocols.  Pain intensity decreased to 5/10 and patient is requesting discharge home.  He will resume his oxycodone and Xtampza.  Patient scheduled to follow-up with PCP on 04/18/2021 for medication management.  Chronic kidney disease stage IV: Creatinine improved to baseline at 2.8.  He will  follow-up with nephrologist at [facility].  Patient was therefore discharged home today in a hemodynamically stable condition.   [name] [name] will follow-up with PCP within 1 week of this discharge. [name] [name] was counseled extensively about nonpharmacologic means of pain management, patient verbalized understanding and was appreciative of  the care received during this admission.    We discussed the need for good hydration, monitoring of hydration status, avoidance of heat, cold, stress, and infection triggers. We discussed the need to be adherent with taking Hydrea and other home medications. Patient was reminded of the need to seek medical attention immediately if any symptom of bleeding, anemia, or infection occurs.  Discharge Exam: Vitals:   04/12/21 1013 04/12/21 1244  BP: 100/70   Pulse: 96   Resp: 14 11  Temp: 98.1 F (36.7 C)   SpO2: 91% 91%   Vitals:   04/12/21 0828 04/12/21 0840 04/12/21 1013 04/12/21 1244  BP:   100/70   Pulse:   96   Resp: 17  14 11   Temp:   98.1 F (36.7 C)   TempSrc:   Oral   SpO2: 95% 93% 91% 91%  Weight:      Height:        General appearance : Awake, alert, not in any distress. Speech Clear. Not toxic looking HEENT: Atraumatic and Normocephalic, pupils equally reactive to light and accomodation Neck: Supple, no JVD. No cervical lymphadenopathy.  Chest: Good air entry bilaterally, no added sounds  CVS: S1 S2 regular, no murmurs.  Abdomen: Bowel sounds present, Non tender and not distended with no gaurding, rigidity or rebound. Extremities: B/L Lower Ext shows no edema, both legs are warm to touch Neurology: Awake alert, and oriented X 3, CN II-XII intact, Non focal Skin: No Rash  Discharge Instructions  Discharge Instructions     Discharge patient   Complete by: As directed    Discharge disposition: 01-Home or Self Care   Discharge patient date: 04/12/2021      Allergies as of 04/12/2021       Reactions   Nsaids Other (See Comments)   Kidney disease   Morphine And Related Other (See Comments)   "Real bad headaches"         Medication List     TAKE these medications    acetaminophen 650 MG CR tablet Commonly known as: TYLENOL Take 650 mg by mouth every 8 (eight) hours as needed for pain.   amitriptyline 25 MG tablet Commonly known as: ELAVIL Take 25-50 mg by mouth at bedtime. For sleep    calcitRIOL 0.25 MCG capsule Commonly known as: ROCALTROL Take 0.25 mcg by mouth daily.   Ferriprox Twice-A-Day 1000 MG Tabs Generic drug: Deferiprone (Twice Daily) Take 1,000 mg by mouth 2 (two) times daily.   folic acid 1 MG tablet Commonly known as: FOLVITE TAKE 1 TABLET BY MOUTH EVERY DAY   hydroxyurea 500 MG capsule Commonly known as: HYDREA Take 500 mg by mouth daily.   Lokelma 10 g Pack packet Generic drug: sodium zirconium cyclosilicate Take 10 g by mouth every other day.   Oxbryta 500 MG Tabs tablet Generic drug: voxelotor Take 1,500 mg by mouth daily.   Oxycodone HCl 10 MG Tabs Take 1 tablet by mouth every 6 (six) hours as needed for up to 15 days. What changed: reasons to take this   sodium bicarbonate 650 MG tablet Take 1,950 mg by mouth 3 (three) times daily.   Xtampza ER 18 MG C12a Generic drug:  oxyCODONE ER Take 1 capsule by mouth every 12 (twelve) hours.        The results of significant diagnostics from this hospitalization (including imaging, microbiology, ancillary and laboratory) are listed below for reference.    Significant Diagnostic Studies: DG Knee 1-2 Views Right  Result Date: 04/11/2021 CLINICAL DATA:  Right knee pain EXAM: RIGHT KNEE - 1-2 VIEW COMPARISON:  None. FINDINGS: There are bone infarcts throughout the distal femur and proximal fibula likely related to sickle cell disease. Infarct extends to the chondral surface in the lateral femoral condyle where there is collapse and likely associated osteochondral lesion/abnormality. Small joint effusion. No subluxation or dislocation. IMPRESSION: Extensive bone infarcts with associated osteochondral lesion/abnormality in the lateral femoral condyle. New Electronically Signed   By: Rolm Baptise M.D.   On: 04/11/2021 18:37   DG Chest Portable 1 View  Result Date: 04/11/2021 CLINICAL DATA:  Chest pain, sickle cell EXAM: PORTABLE CHEST 1 VIEW COMPARISON:  02/06/2021 FINDINGS: Chronic mild  interstitial changes. No new consolidation. No pleural effusion or pneumothorax. Stable cardiomediastinal contours. IMPRESSION: No acute process in the chest. Electronically Signed   By: Macy Mis M.D.   On: 04/11/2021 10:19    Microbiology: Recent Results (from the past 240 hour(s))  Resp Panel by RT-PCR (Flu A&B, Covid) Nasopharyngeal Swab     Status: None   Collection Time: 04/11/21  9:36 AM   Specimen: Nasopharyngeal Swab; Nasopharyngeal(NP) swabs in vial transport medium  Result Value Ref Range Status   SARS Coronavirus 2 by RT PCR NEGATIVE NEGATIVE Final    Comment: (NOTE) SARS-CoV-2 target nucleic acids are NOT DETECTED.  The SARS-CoV-2 RNA is generally detectable in upper respiratory specimens during the acute phase of infection. The lowest concentration of SARS-CoV-2 viral copies this assay can detect is 138 copies/mL. A negative result does not preclude SARS-Cov-2 infection and should not be used as the sole basis for treatment or other patient management decisions. A negative result may occur with  improper specimen collection/handling, submission of specimen other than nasopharyngeal swab, presence of viral mutation(s) within the areas targeted by this assay, and inadequate number of viral copies(<138 copies/mL). A negative result must be combined with clinical observations, patient history, and epidemiological information. The expected result is Negative.  Fact Sheet for Patients:  EntrepreneurPulse.com.au  Fact Sheet for Healthcare Providers:  IncredibleEmployment.be  This test is no t yet approved or cleared by the [place] FDA and  has been authorized for detection and/or diagnosis of SARS-CoV-2 by FDA under an Emergency Use Authorization (EUA). This EUA will remain  in effect (meaning this test can be used) for the duration of the COVID-19 declaration under Section 564(b)(1) of the Act, 21 U.S.C.section 360bbb-3(b)(1),  unless the authorization is terminated  or revoked sooner.       Influenza A by PCR NEGATIVE NEGATIVE Final   Influenza B by PCR NEGATIVE NEGATIVE Final    Comment: (NOTE) The Xpert Xpress SARS-CoV-2/FLU/RSV plus assay is intended as an aid in the diagnosis of influenza from Nasopharyngeal swab specimens and should not be used as a sole basis for treatment. Nasal washings and aspirates are unacceptable for Xpert Xpress SARS-CoV-2/FLU/RSV testing.  Fact Sheet for Patients: EntrepreneurPulse.com.au  Fact Sheet for Healthcare Providers: IncredibleEmployment.be  This test is not yet approved or cleared by the [place] FDA and has been authorized for detection and/or diagnosis of SARS-CoV-2 by FDA under an Emergency Use Authorization (EUA). This EUA will remain in effect (meaning this test can  be used) for the duration of the COVID-19 declaration under Section 564(b)(1) of the Act, 21 U.S.C. section 360bbb-3(b)(1), unless the authorization is terminated or revoked.  Performed at [facility], [place] [address], [place], [place] [address]      Labs: Basic Metabolic Panel: Recent Labs  Lab 04/08/21 0837 04/11/21 0850 04/12/21 0352  NA 138 134* 133*  K 3.9 4.5 5.0  CL 105 106 105  CO2 24 21* 20*  GLUCOSE 106* 137* 93  BUN 47* 47* 40*  CREATININE 3.75* 3.53* 2.89*  CALCIUM 8.0* 8.4* 8.1*   Liver Function Tests: Recent Labs  Lab 04/08/21 0837 04/11/21 0850  AST 38 56*  ALT 32 44  ALKPHOS 182* 216*  BILITOT 1.7* 2.0*  PROT 6.9 7.0  ALBUMIN 3.5 3.4*   No results for input(s): LIPASE, AMYLASE in the last 168 hours. No results for input(s): AMMONIA in the last 168 hours. CBC: Recent Labs  Lab 04/08/21 0837 04/11/21 0850 04/12/21 0352  WBC 21.2* 17.0* 13.6*  NEUTROABS 9.1* 7.7  --   HGB 7.4* 5.6* 8.7*  HCT 20.5* 15.6* 25.4*  MCV 103.5* 103.3* 98.1  PLT 269 310 257   Cardiac Enzymes: No results for  input(s): CKTOTAL, CKMB, CKMBINDEX, TROPONINI in the last 168 hours. BNP: Invalid input(s): POCBNP CBG: No results for input(s): GLUCAP in the last 168 hours.  Time coordinating discharge: 30 minutes  Signed: [name]  APRN, MSN, FNP-C [name], [place] [id number] [phone]  [facility] 04/12/2021, 1:35 PM

## 2021-04-12 NOTE — Plan of Care (Signed)
Discharge instructions reviewed with patient, questions answered, verbalized understanding. Patient transported to main entrance to be taken home by family member.

## 2021-04-18 ENCOUNTER — Ambulatory Visit: Payer: Medicare HMO | Admitting: Orthopedic Surgery

## 2021-04-19 ENCOUNTER — Encounter: Payer: Self-pay | Admitting: Family Medicine

## 2021-04-19 ENCOUNTER — Other Ambulatory Visit: Payer: Self-pay

## 2021-04-19 ENCOUNTER — Ambulatory Visit (INDEPENDENT_AMBULATORY_CARE_PROVIDER_SITE_OTHER): Payer: Medicare HMO | Admitting: Family Medicine

## 2021-04-19 VITALS — BP 106/82 | HR 94 | Temp 98.2°F | Ht 63.0 in | Wt 125.2 lb

## 2021-04-19 DIAGNOSIS — G894 Chronic pain syndrome: Secondary | ICD-10-CM

## 2021-04-19 DIAGNOSIS — N184 Chronic kidney disease, stage 4 (severe): Secondary | ICD-10-CM | POA: Diagnosis not present

## 2021-04-19 DIAGNOSIS — E559 Vitamin D deficiency, unspecified: Secondary | ICD-10-CM

## 2021-04-19 DIAGNOSIS — D571 Sickle-cell disease without crisis: Secondary | ICD-10-CM

## 2021-04-19 LAB — POCT URINALYSIS DIP (CLINITEK)
Bilirubin, UA: NEGATIVE
Blood, UA: NEGATIVE
Glucose, UA: NEGATIVE mg/dL
Ketones, POC UA: NEGATIVE mg/dL
Leukocytes, UA: NEGATIVE
Nitrite, UA: NEGATIVE
POC PROTEIN,UA: >=300 — AB
Spec Grav, UA: 1.02 (ref 1.010–1.025)
Urobilinogen, UA: 0.2 E.U./dL
pH, UA: 7.5 (ref 5.0–8.0)

## 2021-04-19 NOTE — Patient Instructions (Signed)
Sickle Cell Anemia, Adult ?Sickle cell anemia is a condition where your red blood cells are shaped like sickles. Red blood cells carry oxygen through the body. Sickle-shaped cells do not live as long as normal red blood cells. They also clump together and block blood from flowing through the blood vessels. This prevents the body from getting enough oxygen. Sickle cell anemia causes organ damage and pain. It also increases the risk of infection. ?Follow these instructions at home: ?Medicines ?Take over-the-counter and prescription medicines only as told by your doctor. ?If you were prescribed an antibiotic medicine, take it as told by your doctor. Do not stop taking the antibiotic even if you start to feel better. ?If you develop a fever, do not take medicines to lower the fever right away. Tell your doctor about the fever. ?Managing pain, stiffness, and swelling ?Try these methods to help with pain: ?Use a heating pad. ?Take a warm bath. ?Distract yourself, such as by watching TV. ?Eating and drinking ?Drink enough fluid to keep your pee (urine) clear or pale yellow. Drink more in hot weather and during exercise. ?Limit or avoid alcohol. ?Eat a healthy diet. Eat plenty of fruits, vegetables, whole grains, and lean protein. ?Take vitamins and supplements as told by your doctor. ?Traveling ?When traveling, keep these with you: ?Your medical information. ?The names of your doctors. ?Your medicines. ?If you need to take an airplane, talk to your doctor first. ?Activity ?Rest often. ?Avoid exercises that make your heart beat much faster, such as jogging. ?General instructions ?Do not use products that have nicotine or tobacco, such as cigarettes and e-cigarettes. If you need help quitting, ask your doctor. ?Consider wearing a medical alert bracelet. ?Avoid being in high places (high altitudes), such as mountains. ?Avoid very hot or cold temperatures. ?Avoid places where the temperature changes a lot. ?Keep all follow-up  visits as told by your doctor. This is important. ?Contact a doctor if: ?A joint hurts. ?Your feet or hands hurt or swell. ?You feel tired (fatigued). ?Get help right away if: ?You have symptoms of infection. These include: ?Fever. ?Chills. ?Being very tired. ?Irritability. ?Poor eating. ?Throwing up (vomiting). ?You feel dizzy or faint. ?You have new stomach pain, especially on the left side. ?You have a an erection (priapism) that lasts more than 4 hours. ?You have numbness in your arms or legs. ?You have a hard time moving your arms or legs. ?You have trouble talking. ?You have pain that does not go away when you take medicine. ?You are short of breath. ?You are breathing fast. ?You have a long-term cough. ?You have pain in your chest. ?You have a bad headache. ?You have a stiff neck. ?Your stomach looks bloated even though you did not eat much. ?Your skin is pale. ?You suddenly cannot see well. ?Summary ?Sickle cell anemia is a condition where your red blood cells are shaped like sickles. ?Follow your doctor's advice on ways to manage pain, food to eat, activities to do, and steps to take for safe travel. ?Get medical help right away if you have any signs of infection, such as a fever. ?This information is not intended to replace advice given to you by your health care provider. Make sure you discuss any questions you have with your health care provider. ?Document Revised: 06/26/2019 Document Reviewed: 06/26/2019 ?Elsevier Patient Education ? [place]. ? ?

## 2021-04-19 NOTE — Progress Notes (Unsigned)
Patient [name]   Established Patient Office Visit  Subjective:  Patient ID: [name] Kilpatrick., male    DOB: 1980/10/28  Age: 41 y.o. MRN: [id number]  CC:  Chief Complaint  Patient presents with   Follow-up    Pt is here today for his 3 month follow up visit with no concerns or issues to discuss.    HPI [name] [name]. presents for ***  Past Medical History:  Diagnosis Date   Abscess of right iliac fossa 09/24/2018   required Perc Drain 09/24/2018   Arachnoid Cyst of brain bilaterally    "2 really small ones in the back of my head; inside; saw them w/MRI" (09/25/2012)   Bacterial pneumonia ~ 2012   "caught it here in the hospital" (09/25/2012)   Chronic kidney disease    "from my [name] cell" (09/25/2012)   CKD (chronic kidney disease), stage II    Colitis 04/19/2017   CT scan abd/ pelvis   GERD (gastroesophageal reflux disease)    "after I eat alot of spicey foods" (09/25/2012)   Gynecomastia, male 07/10/2012   History of blood transfusion    "always related to [name] cell crisis" (09/25/2012)   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   Noted in noted from Hematology notes at [facility]    "take RX qd to prevent them" (09/25/2012)   Opioid dependence with withdrawal ([place]) 08/30/2012   Renal insufficiency    [name] cell anemia (HCC)    [name] cell crisis ([place]) 09/25/2012   [name] cell nephropathy ([place]) 07/10/2012   Sinus tachycardia    Tachycardia with heart rate 121-140 beats per minute with ambulation 08/04/2016    Past Surgical History:  Procedure Laterality Date   ABCESS DRAINAGE     CHOLECYSTECTOMY  ~ 2012   COLONOSCOPY N/A 04/23/2017   Procedure: COLONOSCOPY;  Surgeon: Irene Shipper, MD;  Location: [facility];  Service: Endoscopy;  Laterality: N/A;   IR FLUORO GUIDE CV LINE RIGHT  12/17/2016   IR FLUORO GUIDE CV LINE RIGHT  02/07/2021   IR RADIOLOGIST EVAL & MGMT  10/02/2018   IR  RADIOLOGIST EVAL & MGMT  10/15/2018   IR REMOVAL TUN CV CATH W/O FL  12/21/2016   IR REMOVAL TUN CV CATH W/O FL  02/11/2021   IR US GUIDE VASC ACCESS RIGHT  12/17/2016   IR US GUIDE VASC ACCESS RIGHT  02/07/2021   spleenectomy      Family History  Problem Relation Age of Onset   Breast cancer Mother     Social History   Socioeconomic History   Marital status: Single    Spouse name: Not on file   Number of children: Not on file   Years of education: Not on file   Highest education level: Not on file  Occupational History   Not on file  Tobacco Use   Smoking status: Former    Packs/day: 0.10    Types: Cigarettes   Smokeless tobacco: Never   Tobacco comments:    09/25/2012 "I don't buy cigarettes; bum one from friends q now and then"  Vaping Use   Vaping Use: Never used  Substance and Sexual Activity   Alcohol use: Yes    Comment: Once a month   Drug use: Yes    Types: Marijuana    Comment: Every 2-3 weeks    Sexual activity: Yes    Birth control/protection: Condom  Other Topics Concern  Not on file  Social History Narrative    Lives alone in [place].   Back at school, studying Public relations account executive. Unemployed.    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    Social Determinants of Health   Financial Resource Strain: Low Risk    Difficulty of Paying Living Expenses: Not hard at all  Food Insecurity: No Food Insecurity   Worried About Charity fundraiser in the Last Year: Never true   Arboriculturist in the Last Year: Never true  Transportation Needs: Unmet Transportation Needs   Lack of Transportation (Medical): Yes   Lack of Transportation (Non-Medical): Yes  Physical Activity: Inactive   Days of Exercise per Week: 0 days   Minutes of Exercise per Session: 0 min  Stress: Stress Concern Present   Feeling of Stress : To  some extent  Social Connections: Socially Isolated   Frequency of Communication with Friends and Family: More than three times a week   Frequency of Social Gatherings with Friends and Family: More than three times a week   Attends Religious Services: Never   Marine scientist or Organizations: No   Attends Music therapist: Never   Marital Status: Divorced  Human resources officer Violence: Not At Risk   Fear of Current or Ex-Partner: No   Emotionally Abused: No   Physically Abused: No   Sexually Abused: No    Outpatient Medications Prior to Visit  Medication Sig Dispense Refill   acetaminophen (TYLENOL) 650 MG CR tablet Take 650 mg by mouth every 8 (eight) hours as needed for pain.     amitriptyline (ELAVIL) 25 MG tablet Take 25-50 mg by mouth at bedtime. For sleep     calcitRIOL (ROCALTROL) 0.25 MCG capsule Take 0.25 mcg by mouth daily.     Deferiprone, Twice Daily, (FERRIPROX TWICE-A-DAY) 1000 MG TABS Take 1,000 mg by mouth 2 (two) times daily.     folic acid (FOLVITE) 1 MG tablet TAKE 1 TABLET BY MOUTH EVERY DAY (Patient taking differently: Take 1 mg by mouth daily.) 90 tablet 4   hydroxyurea (HYDREA) 500 MG capsule Take 500 mg by mouth daily.     LOKELMA 10 g PACK packet Take 10 g by mouth every other day.     OXBRYTA 500 MG TABS tablet Take 1,500 mg by mouth daily.     oxyCODONE ER (XTAMPZA ER) 18 MG C12A Take 1 capsule by mouth every 12 (twelve) hours. 60 capsule 0   Oxycodone HCl 10 MG TABS Take 1 tablet by mouth every 6 (six) hours as needed for up to 15 days. (Patient taking differently: Take 10 mg by mouth every 6 (six) hours as needed (pain).) 60 tablet 0   sodium bicarbonate 650 MG tablet Take 1,950 mg by mouth 3 (three) times daily.     No facility-administered medications prior to visit.    Allergies  Allergen Reactions   Nsaids Other (See Comments)    Kidney disease   Morphine And Related Other (See Comments)    "Real bad headaches"     ROS Review  of Systems    Objective:    Physical Exam  BP 106/82    Pulse 94    Temp 98.2 F (36.8 C)    Ht '5\' 3"'  (1.6 m)    Wt 125 lb 3.2  oz (56.8 kg)    SpO2 99%    BMI 22.18 kg/m  Wt Readings from Last 3 Encounters:  04/19/21 125 lb 3.2 oz (56.8 kg)  04/11/21 111 lb 12.4 oz (50.7 kg)  02/06/21 111 lb 12.4 oz (50.7 kg)     Health Maintenance Due  Topic Date Due   COVID-19 Vaccine (3 - Moderna risk series) 07/24/2019    There are no preventive care reminders to display for this patient.  Lab Results  Component Value Date   TSH 2.860 05/11/2017   Lab Results  Component Value Date   WBC 13.6 (H) 04/12/2021   HGB 8.7 (L) 04/12/2021   HCT 25.4 (L) 04/12/2021   MCV 98.1 04/12/2021   PLT 257 04/12/2021   Lab Results  Component Value Date   NA 133 (L) 04/12/2021   K 5.0 04/12/2021   CO2 20 (L) 04/12/2021   GLUCOSE 93 04/12/2021   BUN 40 (H) 04/12/2021   CREATININE 2.89 (H) 04/12/2021   BILITOT 2.0 (H) 04/11/2021   ALKPHOS 216 (H) 04/11/2021   AST 56 (H) 04/11/2021   ALT 44 04/11/2021   PROT 7.0 04/11/2021   ALBUMIN 3.4 (L) 04/11/2021   CALCIUM 8.1 (L) 04/12/2021   ANIONGAP 8 04/12/2021   EGFR 30 (L) 01/11/2021   Lab Results  Component Value Date   CHOL 145 03/07/2018   Lab Results  Component Value Date   HDL 65 03/07/2018   Lab Results  Component Value Date   LDLCALC 30 03/07/2018   Lab Results  Component Value Date   TRIG 251 (H) 03/07/2018   Lab Results  Component Value Date   CHOLHDL 2.2 03/07/2018   Lab Results  Component Value Date   HGBA1C <4.2 (L) 03/07/2018      Assessment & Plan:   Problem List Items Addressed This Visit       Other   Hb-SS disease without crisis ([place]) - Primary   Relevant Orders   POCT URINALYSIS DIP (CLINITEK)    No orders of the defined types were placed in this encounter.   Follow-up: Return in about 3 months (around 07/20/2021).    [name] Sickle, FNP

## 2021-04-20 LAB — CMP14+CBC/D/PLT+FER+RETIC+V...
ALT: 27 IU/L (ref 0–44)
AST: 38 IU/L (ref 0–40)
Albumin/Globulin Ratio: 1.4 (ref 1.2–2.2)
Albumin: 3.6 g/dL — ABNORMAL LOW (ref 4.0–5.0)
Alkaline Phosphatase: 267 IU/L — ABNORMAL HIGH (ref 44–121)
BUN/Creatinine Ratio: 14 (ref 9–20)
BUN: 36 mg/dL — ABNORMAL HIGH (ref 6–20)
Basophils Absolute: 0.1 10*3/uL (ref 0.0–0.2)
Basos: 0 %
Bilirubin Total: 0.7 mg/dL (ref 0.0–1.2)
CO2: 22 mmol/L (ref 20–29)
Calcium: 8.3 mg/dL — ABNORMAL LOW (ref 8.7–10.2)
Chloride: 102 mmol/L (ref 96–106)
Creatinine, Ser: 2.6 mg/dL — ABNORMAL HIGH (ref 0.76–1.27)
EOS (ABSOLUTE): 0.3 10*3/uL (ref 0.0–0.4)
Eos: 2 %
Ferritin: 6019 ng/mL — ABNORMAL HIGH (ref 30–400)
Globulin, Total: 2.5 g/dL (ref 1.5–4.5)
Glucose: 89 mg/dL (ref 70–99)
Hematocrit: 27.8 % — ABNORMAL LOW (ref 37.5–51.0)
Hemoglobin: 10.1 g/dL — ABNORMAL LOW (ref 13.0–17.7)
Immature Grans (Abs): 0 10*3/uL (ref 0.0–0.1)
Immature Granulocytes: 0 %
Lymphocytes Absolute: 7 10*3/uL — ABNORMAL HIGH (ref 0.7–3.1)
Lymphs: 42 %
MCH: 36.6 pg — ABNORMAL HIGH (ref 26.6–33.0)
MCHC: 36.3 g/dL — ABNORMAL HIGH (ref 31.5–35.7)
MCV: 101 fL — ABNORMAL HIGH (ref 79–97)
Monocytes Absolute: 1.4 10*3/uL — ABNORMAL HIGH (ref 0.1–0.9)
Monocytes: 8 %
Neutrophils Absolute: 7.9 10*3/uL — ABNORMAL HIGH (ref 1.4–7.0)
Neutrophils: 48 %
Platelets: 353 10*3/uL (ref 150–450)
Potassium: 4.9 mmol/L (ref 3.5–5.2)
RBC: 2.76 x10E6/uL — ABNORMAL LOW (ref 4.14–5.80)
RDW: 21.1 % — ABNORMAL HIGH (ref 11.6–15.4)
Retic Ct Pct: 3.4 % — ABNORMAL HIGH (ref 0.6–2.6)
Sodium: 139 mmol/L (ref 134–144)
Total Protein: 6.1 g/dL (ref 6.0–8.5)
Vit D, 25-Hydroxy: 21.9 ng/mL — ABNORMAL LOW (ref 30.0–100.0)
WBC: 16.7 10*3/uL — ABNORMAL HIGH (ref 3.4–10.8)
eGFR: 31 mL/min/{1.73_m2} — ABNORMAL LOW (ref >59)

## 2021-04-25 LAB — DRUG SCREEN 764883 11+OXYCO+ALC+CRT-BUND

## 2021-04-26 ENCOUNTER — Telehealth: Payer: Self-pay

## 2021-04-26 NOTE — Telephone Encounter (Signed)
Oxycodone 10mg

## 2021-04-27 ENCOUNTER — Other Ambulatory Visit: Payer: Self-pay | Admitting: Family Medicine

## 2021-04-27 ENCOUNTER — Other Ambulatory Visit (HOSPITAL_COMMUNITY): Payer: Self-pay

## 2021-04-27 DIAGNOSIS — G894 Chronic pain syndrome: Secondary | ICD-10-CM

## 2021-04-27 DIAGNOSIS — D571 Sickle-cell disease without crisis: Secondary | ICD-10-CM

## 2021-04-27 MED ORDER — XTAMPZA ER 18 MG PO C12A
18.0000 mg | EXTENDED_RELEASE_CAPSULE | Freq: Two times a day (BID) | ORAL | 0 refills | Status: DC
  Filled 2021-05-04: qty 60, 30d supply, fill #0

## 2021-04-27 MED ORDER — OXYCODONE HCL 10 MG PO TABS
10.0000 mg | ORAL_TABLET | Freq: Four times a day (QID) | ORAL | 0 refills | Status: DC | PRN
  Filled 2021-04-27: qty 60, 15d supply, fill #0

## 2021-04-27 NOTE — Progress Notes (Signed)
Reviewed PDMP substance reporting system prior to prescribing opiate medications. No inconsistencies noted.  ?Meds ordered this encounter  ?Medications  ? oxyCODONE 10 MG TABS  ?  Sig: Take 1 tablet (10 mg total) by mouth every 6 (six) hours as needed for pain.  ?  Dispense:  60 tablet  ?  Refill:  0  ?  Order Specific Question:   Supervising Provider  ?  AnswerTresa Garter [[id number]]  ? oxyCODONE ER (XTAMPZA ER) 18 MG C12A  ?  Sig: Take 1 capsule by mouth every 12 (twelve) hours.  ?  Dispense:  60 capsule  ?  Refill:  0  ?  Order Specific Question:   Supervising Provider  ?  AnswerTresa Garter [[id number]]  ? [name]  APRN, MSN, FNP-C ?Patient [place] ?[facility] ?[address]  ?[place], [place] [address] [phone] a ? ?

## 2021-05-04 ENCOUNTER — Other Ambulatory Visit (HOSPITAL_COMMUNITY): Payer: Self-pay

## 2021-05-04 ENCOUNTER — Ambulatory Visit (INDEPENDENT_AMBULATORY_CARE_PROVIDER_SITE_OTHER): Payer: Medicare HMO

## 2021-05-04 ENCOUNTER — Ambulatory Visit (INDEPENDENT_AMBULATORY_CARE_PROVIDER_SITE_OTHER): Payer: Medicare HMO | Admitting: Orthopedic Surgery

## 2021-05-04 ENCOUNTER — Other Ambulatory Visit: Payer: Self-pay

## 2021-05-04 DIAGNOSIS — M175 Other unilateral secondary osteoarthritis of knee: Secondary | ICD-10-CM

## 2021-05-04 DIAGNOSIS — M25562 Pain in left knee: Secondary | ICD-10-CM

## 2021-05-05 ENCOUNTER — Other Ambulatory Visit (HOSPITAL_COMMUNITY): Payer: Self-pay

## 2021-05-07 ENCOUNTER — Encounter: Payer: Self-pay | Admitting: Orthopedic Surgery
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
## 2021-05-08 ENCOUNTER — Encounter: Payer: Self-pay | Admitting: Orthopedic Surgery

## 2021-05-11 DIAGNOSIS — D571 Sickle-cell disease without crisis: Secondary | ICD-10-CM | POA: Diagnosis not present

## 2021-05-11 DIAGNOSIS — N184 Chronic kidney disease, stage 4 (severe): Secondary | ICD-10-CM | POA: Diagnosis not present

## 2021-05-11 DIAGNOSIS — Z79899 Other long term (current) drug therapy: Secondary | ICD-10-CM | POA: Diagnosis not present

## 2021-05-17 ENCOUNTER — Ambulatory Visit: Admit: 2021-05-17 | Payer: Medicare HMO | Admitting: Orthopedic Surgery

## 2021-05-17 SURGERY — ARTHROPLASTY, KNEE, TOTAL
Anesthesia: Spinal | Site: Knee | Laterality: Right

## 2021-05-19 ENCOUNTER — Non-Acute Institutional Stay (HOSPITAL_COMMUNITY)
Admission: RE | Admit: 2021-05-19 | Discharge: 2021-05-19 | Disposition: A | Discharge status: Home / Self Care | Payer: Medicare Other | Source: Ambulatory Visit | Attending: Internal Medicine | Admitting: Internal Medicine

## 2021-05-19 ENCOUNTER — Other Ambulatory Visit: Payer: Self-pay | Admitting: Family Medicine

## 2021-05-19 ENCOUNTER — Telehealth: Payer: Self-pay | Admitting: Family Medicine

## 2021-05-19 ENCOUNTER — Other Ambulatory Visit (HOSPITAL_COMMUNITY): Payer: Self-pay

## 2021-05-19 DIAGNOSIS — G894 Chronic pain syndrome: Secondary | ICD-10-CM

## 2021-05-19 DIAGNOSIS — N184 Chronic kidney disease, stage 4 (severe): Secondary | ICD-10-CM

## 2021-05-19 DIAGNOSIS — D571 Sickle-cell disease without crisis: Secondary | ICD-10-CM | POA: Diagnosis not present

## 2021-05-19 LAB — COMPREHENSIVE METABOLIC PANEL
ALT: 53 U/L — ABNORMAL HIGH (ref 0–44)
AST: 57 U/L — ABNORMAL HIGH (ref 15–41)
Albumin: 3.7 g/dL (ref 3.5–5.0)
Alkaline Phosphatase: 218 U/L — ABNORMAL HIGH (ref 38–126)
Anion gap: 7 (ref 5–15)
BUN: 57 mg/dL — ABNORMAL HIGH (ref 6–20)
CO2: 22 mmol/L (ref 22–32)
Calcium: 8.6 mg/dL — ABNORMAL LOW (ref 8.9–10.3)
Chloride: 106 mmol/L (ref 98–111)
Creatinine, Ser: 4.34 mg/dL — ABNORMAL HIGH (ref 0.61–1.24)
GFR, Estimated: 17 mL/min — ABNORMAL LOW (ref >60)
Glucose, Bld: 125 mg/dL — ABNORMAL HIGH (ref 70–99)
Potassium: 5.3 mmol/L — ABNORMAL HIGH (ref 3.5–5.1)
Sodium: 135 mmol/L (ref 135–145)
Total Bilirubin: 0.7 mg/dL (ref 0.3–1.2)
Total Protein: 7.5 g/dL (ref 6.5–8.1)

## 2021-05-19 LAB — CBC WITH DIFFERENTIAL/PLATELET
Abs Immature Granulocytes: 0.09 10*3/uL — ABNORMAL HIGH (ref 0.00–0.07)
Basophils Absolute: 0.1 10*3/uL (ref 0.0–0.1)
Basophils Relative: 0 %
Eosinophils Absolute: 0.8 10*3/uL — ABNORMAL HIGH (ref 0.0–0.5)
Eosinophils Relative: 4 %
HCT: 28.8 % — ABNORMAL LOW (ref 39.0–52.0)
Hemoglobin: 10.3 g/dL — ABNORMAL LOW (ref 13.0–17.0)
Immature Granulocytes: 1 %
Lymphocytes Relative: 36 %
Lymphs Abs: 7.1 10*3/uL — ABNORMAL HIGH (ref 0.7–4.0)
MCH: 37.3 pg — ABNORMAL HIGH (ref 26.0–34.0)
MCHC: 35.8 g/dL (ref 30.0–36.0)
MCV: 104.3 fL — ABNORMAL HIGH (ref 80.0–100.0)
Monocytes Absolute: 1.5 10*3/uL — ABNORMAL HIGH (ref 0.1–1.0)
Monocytes Relative: 8 %
Neutro Abs: 10.2 10*3/uL — ABNORMAL HIGH (ref 1.7–7.7)
Neutrophils Relative %: 51 %
Platelets: 346 10*3/uL (ref 150–400)
RBC: 2.76 MIL/uL — ABNORMAL LOW (ref 4.22–5.81)
RDW: 17.8 % — ABNORMAL HIGH (ref 11.5–15.5)
WBC: 19.7 10*3/uL — ABNORMAL HIGH (ref 4.0–10.5)
nRBC: 0 % (ref 0.0–0.2)

## 2021-05-19 LAB — RAPID URINE DRUG SCREEN, HOSP PERFORMED
Amphetamines: NOT DETECTED
Barbiturates: NOT DETECTED
Benzodiazepines: NOT DETECTED
Cocaine: NOT DETECTED
Opiates: POSITIVE — AB
Tetrahydrocannabinol: POSITIVE — AB

## 2021-05-19 LAB — CREATININE, SERUM
Creatinine, Ser: 3.9 mg/dL — ABNORMAL HIGH (ref 0.61–1.24)
GFR, Estimated: 19 mL/min — ABNORMAL LOW (ref >60)

## 2021-05-19 MED ORDER — OXYCODONE HCL 10 MG PO TABS
10.0000 mg | ORAL_TABLET | Freq: Four times a day (QID) | ORAL | 0 refills | Status: DC | PRN
  Filled 2021-05-19: qty 60, 15d supply, fill #0

## 2021-05-19 MED ORDER — SODIUM CHLORIDE 0.45 % IV BOLUS
500.0000 mL | Freq: Once | INTRAVENOUS | Status: AC
  Administered 2021-05-19: 500 mL via INTRAVENOUS

## 2021-05-19 NOTE — Telephone Encounter (Signed)
[name] [name] is a 41 year old male with a medical history significant for sickle cell disease, chronic pain syndrome, opiate dependence and tolerance, history of anemia of chronic disease, and stage IV chronic kidney disease presented this a.m. for labs.  Creatinine markedly elevated above baseline at 4.3.  Patient will return to clinic for fluid resuscitation.  We will follow labs closely. ? ?[name]  APRN, MSN, FNP-C ?[facility] [name] ?[facility] ?[address]  ?[place], [place] [address] ?[phone] ? ?

## 2021-05-19 NOTE — Progress Notes (Signed)
PATIENT CARE CENTER NOTE ? ? ?Diagnosis: Sickle Cell Anemia  ? ? ?Provider: Hollis, [place], [place] ? ? ?Procedure: Lab work and UDS ? ? ?Note:  Patient's labs (CBC w/diff, CMP) drawn peripherally. Patient tolerated well. Patient also gave urine sample for UDS. Patient alert, oriented and ambulatory at discharge.   ?

## 2021-05-19 NOTE — Progress Notes (Signed)
Reviewed PDMP substance reporting system prior to prescribing opiate medications. No inconsistencies noted.  Meds ordered this encounter  Medications   Oxycodone HCl 10 MG TABS    Sig: Take 1 tablet (10 mg total) by mouth every 6 (six) hours as needed.    Dispense:  60 tablet    Refill:  0    Order Specific Question:   Supervising Provider    Answer:   [name], [name] [[id number]]   [name] Haapala Moore Erasto Sleight  APRN, MSN, FNP-C [facility] [address]  [place], [place] [address] [phone]  

## 2021-05-19 NOTE — Progress Notes (Signed)
PATIENT CARE CENTER NOTE ? ?Diagnosis: CKD (chronic kidney disease), stage IV (HCC) (N18.4) ? ? ?Provider: Hollis, [place], [place] ? ? ?Procedure: IV fluid bolus and lab draw ? ? ?Note:  Patient received 500 cc fluid bolus of 0.45% sodium chloride via PIV. Patient tolerated well. Labs drawn (Creatinine) post infusion. Vital signs stable. Discharge instructions given. Patient alert, oriented and ambulatory at discharge.   ?

## 2021-05-23 ENCOUNTER — Other Ambulatory Visit (HOSPITAL_COMMUNITY): Payer: Self-pay

## 2021-05-24 ENCOUNTER — Telehealth: Payer: Self-pay

## 2021-05-24 ENCOUNTER — Other Ambulatory Visit (HOSPITAL_COMMUNITY): Payer: Self-pay

## 2021-05-24 ENCOUNTER — Other Ambulatory Visit: Payer: Self-pay | Admitting: Internal Medicine

## 2021-05-24 NOTE — Telephone Encounter (Signed)
Oxy cotnin ? ?

## 2021-05-25 ENCOUNTER — Telehealth: Payer: Self-pay | Admitting: Family Medicine

## 2021-05-25 ENCOUNTER — Other Ambulatory Visit (HOSPITAL_COMMUNITY): Payer: Self-pay

## 2021-05-25 NOTE — Telephone Encounter (Signed)
Requesting oxycontin 15 mg refill request ?

## 2021-05-26 ENCOUNTER — Other Ambulatory Visit (HOSPITAL_COMMUNITY): Payer: Self-pay

## 2021-05-26 ENCOUNTER — Other Ambulatory Visit: Payer: Self-pay | Admitting: Internal Medicine

## 2021-05-26 DIAGNOSIS — G894 Chronic pain syndrome: Secondary | ICD-10-CM

## 2021-05-26 DIAGNOSIS — D571 Sickle-cell disease without crisis: Secondary | ICD-10-CM

## 2021-05-26 MED ORDER — XTAMPZA ER 18 MG PO C12A
18.0000 mg | EXTENDED_RELEASE_CAPSULE | Freq: Two times a day (BID) | ORAL | 0 refills | Status: DC
  Filled ????-??-??: fill #0

## 2021-05-27 ENCOUNTER — Telehealth: Payer: Self-pay | Admitting: Family Medicine

## 2021-05-27 ENCOUNTER — Other Ambulatory Visit (HOSPITAL_COMMUNITY): Payer: Self-pay

## 2021-06-01 ENCOUNTER — Encounter: Payer: Medicare HMO | Admitting: Orthopedic Surgery

## 2021-06-01 ENCOUNTER — Other Ambulatory Visit (HOSPITAL_COMMUNITY): Payer: Self-pay

## 2021-06-02 ENCOUNTER — Other Ambulatory Visit (HOSPITAL_COMMUNITY): Payer: Self-pay

## 2021-06-02 ENCOUNTER — Other Ambulatory Visit: Payer: Self-pay | Admitting: Family Medicine

## 2021-06-02 DIAGNOSIS — D571 Sickle-cell disease without crisis: Secondary | ICD-10-CM

## 2021-06-02 DIAGNOSIS — G894 Chronic pain syndrome: Secondary | ICD-10-CM

## 2021-06-02 MED ORDER — OXYCODONE HCL ER 15 MG PO T12A
15.0000 mg | EXTENDED_RELEASE_TABLET | Freq: Two times a day (BID) | ORAL | 0 refills | Status: DC
  Filled 2021-06-04: qty 14, 7d supply, fill #0

## 2021-06-02 NOTE — Telephone Encounter (Signed)
Xtampza ° °

## 2021-06-02 NOTE — Progress Notes (Signed)
Reviewed PDMP substance reporting system prior to prescribing opiate medications. No inconsistencies noted.  ?Meds ordered this encounter  ?Medications  ? oxyCODONE (OXYCONTIN) 15 mg 12 hr tablet  ?  Sig: Take 1 tablet (15 mg total) by mouth every 12 (twelve) hours.  ?  Dispense:  14 tablet  ?  Refill:  0  ?  Order Specific Question:   Supervising Provider  ?  AnswerTresa Garter [[id number]]  ? [name]  APRN, MSN, FNP-C ?Patient [place] ?[facility] ?[address]  ?[place], [place] [address] ?[phone] ? ?

## 2021-06-04 ENCOUNTER — Other Ambulatory Visit (HOSPITAL_COMMUNITY): Payer: Self-pay

## 2021-06-07 ENCOUNTER — Other Ambulatory Visit (HOSPITAL_COMMUNITY): Payer: Self-pay

## 2021-06-08 ENCOUNTER — Emergency Department (HOSPITAL_COMMUNITY)
Admission: EM | Admit: 2021-06-08 | Discharge: 2021-06-08 | Disposition: A | Discharge status: Home / Self Care | Payer: Medicare Other | Attending: Emergency Medicine | Admitting: Emergency Medicine

## 2021-06-08 ENCOUNTER — Other Ambulatory Visit (HOSPITAL_COMMUNITY): Payer: Self-pay

## 2021-06-08 ENCOUNTER — Encounter (HOSPITAL_COMMUNITY): Payer: Self-pay | Admitting: Emergency Medicine

## 2021-06-08 ENCOUNTER — Other Ambulatory Visit: Payer: Self-pay

## 2021-06-08 ENCOUNTER — Other Ambulatory Visit: Payer: Self-pay | Admitting: Family Medicine

## 2021-06-08 DIAGNOSIS — D5700 Hb-SS disease with crisis, unspecified: Secondary | ICD-10-CM | POA: Insufficient documentation

## 2021-06-08 DIAGNOSIS — M79669 Pain in unspecified lower leg: Secondary | ICD-10-CM | POA: Diagnosis present

## 2021-06-08 DIAGNOSIS — G894 Chronic pain syndrome: Secondary | ICD-10-CM

## 2021-06-08 DIAGNOSIS — D571 Sickle-cell disease without crisis: Secondary | ICD-10-CM

## 2021-06-08 LAB — CBC WITH DIFFERENTIAL/PLATELET
Abs Immature Granulocytes: 0.08 10*3/uL — ABNORMAL HIGH (ref 0.00–0.07)
Basophils Absolute: 0.1 10*3/uL (ref 0.0–0.1)
Basophils Relative: 0 %
Eosinophils Absolute: 0.7 10*3/uL — ABNORMAL HIGH (ref 0.0–0.5)
Eosinophils Relative: 4 %
HCT: 22.3 % — ABNORMAL LOW (ref 39.0–52.0)
Hemoglobin: 8.1 g/dL — ABNORMAL LOW (ref 13.0–17.0)
Immature Granulocytes: 0 %
Lymphocytes Relative: 40 %
Lymphs Abs: 7.9 10*3/uL — ABNORMAL HIGH (ref 0.7–4.0)
MCH: 37.7 pg — ABNORMAL HIGH (ref 26.0–34.0)
MCHC: 36.3 g/dL — ABNORMAL HIGH (ref 30.0–36.0)
MCV: 103.7 fL — ABNORMAL HIGH (ref 80.0–100.0)
Monocytes Absolute: 1.3 10*3/uL — ABNORMAL HIGH (ref 0.1–1.0)
Monocytes Relative: 6 %
Neutro Abs: 9.6 10*3/uL — ABNORMAL HIGH (ref 1.7–7.7)
Neutrophils Relative %: 50 %
Platelets: 258 10*3/uL (ref 150–400)
RBC: 2.15 MIL/uL — ABNORMAL LOW (ref 4.22–5.81)
RDW: 21.6 % — ABNORMAL HIGH (ref 11.5–15.5)
WBC: 19.6 10*3/uL — ABNORMAL HIGH (ref 4.0–10.5)
nRBC: 0.1 % (ref 0.0–0.2)

## 2021-06-08 LAB — COMPREHENSIVE METABOLIC PANEL
ALT: 37 U/L (ref 0–44)
AST: 39 U/L (ref 15–41)
Albumin: 3.8 g/dL (ref 3.5–5.0)
Alkaline Phosphatase: 182 U/L — ABNORMAL HIGH (ref 38–126)
Anion gap: 9 (ref 5–15)
BUN: 33 mg/dL — ABNORMAL HIGH (ref 6–20)
CO2: 31 mmol/L (ref 22–32)
Calcium: 8.5 mg/dL — ABNORMAL LOW (ref 8.9–10.3)
Chloride: 100 mmol/L (ref 98–111)
Creatinine, Ser: 3.49 mg/dL — ABNORMAL HIGH (ref 0.61–1.24)
GFR, Estimated: 22 mL/min — ABNORMAL LOW (ref >60)
Glucose, Bld: 103 mg/dL — ABNORMAL HIGH (ref 70–99)
Potassium: 4.4 mmol/L (ref 3.5–5.1)
Sodium: 140 mmol/L (ref 135–145)
Total Bilirubin: 2.3 mg/dL — ABNORMAL HIGH (ref 0.3–1.2)
Total Protein: 7.2 g/dL (ref 6.5–8.1)

## 2021-06-08 LAB — RETICULOCYTES
Immature Retic Fract: 32.1 % — ABNORMAL HIGH (ref 2.3–15.9)
RBC.: 2.18 MIL/uL — ABNORMAL LOW (ref 4.22–5.81)
Retic Count, Absolute: 239.1 10*3/uL — ABNORMAL HIGH (ref 19.0–186.0)
Retic Ct Pct: 11 % — ABNORMAL HIGH (ref 0.4–3.1)

## 2021-06-08 MED ORDER — SODIUM CHLORIDE 0.9 % IV BOLUS
1000.0000 mL | Freq: Once | INTRAVENOUS | Status: AC
  Administered 2021-06-08: 1000 mL via INTRAVENOUS

## 2021-06-08 MED ORDER — HYDROMORPHONE HCL 2 MG/ML IJ SOLN
2.0000 mg | Freq: Once | INTRAMUSCULAR | Status: AC
  Administered 2021-06-08: 2 mg via INTRAVENOUS
  Filled 2021-06-08: qty 1

## 2021-06-08 MED ORDER — HYDROMORPHONE HCL 2 MG/ML IJ SOLN
2.0000 mg | Freq: Once | INTRAMUSCULAR | Status: AC
Start: 2021-06-08 — End: 2021-06-08
  Administered 2021-06-08: 2 mg via INTRAVENOUS
  Filled 2021-06-08: qty 1

## 2021-06-08 MED ORDER — ONDANSETRON HCL 4 MG/2ML IJ SOLN
4.0000 mg | Freq: Once | INTRAMUSCULAR | Status: AC
  Administered 2021-06-08: 4 mg via INTRAVENOUS
  Filled 2021-06-08: qty 2

## 2021-06-08 MED ORDER — HYDROMORPHONE HCL 1 MG/ML IJ SOLN
1.0000 mg | Freq: Once | INTRAMUSCULAR | Status: AC
  Administered 2021-06-08: 1 mg via INTRAVENOUS
  Filled 2021-06-08: qty 1

## 2021-06-08 MED ORDER — OXYCODONE HCL ER 15 MG PO T12A
15.0000 mg | EXTENDED_RELEASE_TABLET | Freq: Two times a day (BID) | ORAL | 0 refills | Status: DC
  Filled 2021-06-08 – 2021-06-09 (×2): qty 46, 23d supply, fill #0

## 2021-06-08 NOTE — ED Triage Notes (Signed)
Pt reports sickle cell crisis in arms legs and back. Pt reports being out of pain meds.  ?

## 2021-06-08 NOTE — Progress Notes (Signed)
Reviewed PDMP substance reporting system prior to prescribing opiate medications. No inconsistencies noted.  ?Meds ordered this encounter  ?Medications  ? oxyCODONE (OXYCONTIN) 15 mg 12 hr tablet  ?  Sig: Take 1 tablet (15 mg total) by mouth every 12 (twelve) hours.  ?  Dispense:  46 tablet  ?  Refill:  0  ?  Order Specific Question:   Supervising Provider  ?  AnswerTresa Garter [[id number]]  ? [name]  APRN, MSN, FNP-C ?Patient [place] ?[facility] ?[address]  ?[place], [place] [address] ?[phone] ? ?

## 2021-06-08 NOTE — ED Notes (Signed)
Pt given turkey sandwich and water

## 2021-06-08 NOTE — Discharge Instructions (Signed)
The nurse practitioner Smith Robert has arranged for you to get your medicines at the pharmacy.  Go there today and get them Return if problems ?

## 2021-06-08 NOTE — ED Provider Notes (Signed)
?[facility] DEPT ?Provider Note ? ? ?CSN: [id number] ?Arrival date & time: 06/08/21  1042 ? ?  ? ?History ? ?Chief Complaint  ?Patient presents with  ? Sickle Cell Pain Crisis  ? ? ?[name] [name]. is a 41 y.o. male. ? ?The patient complains of upper extremity and lower extremity pain.  Patient has a history of sickle cell disease and feels like he is having a sickle crisis now.  No fevers no chills no cough ? ?The history is provided by the patient and medical records. No language interpreter was used.  ?Sickle Cell Pain Crisis ?Pain location: All extremities. ?Severity:  Moderate ?Onset quality:  Sudden ?Similar to previous crisis episodes: yes   ?Timing:  Constant ?Progression:  Worsening ?Chronicity:  Recurrent ?Sickle cell genotype:  SS ?History of pulmonary emboli: no   ?Context: not alcohol consumption   ?Relieved by:  Nothing ?Worsened by:  Nothing ?Ineffective treatments:  None tried ?Associated symptoms: no chest pain, no congestion, no cough, no fatigue and no headaches   ?Risk factors: no hx of stroke   ? ?  ? ?Home Medications ?Prior to Admission medications   ?Medication Sig Start Date End Date Taking? Authorizing Provider  ?acetaminophen (TYLENOL) 650 MG CR tablet Take 650 mg by mouth every 8 (eight) hours as needed for pain.    [provider]  ?amitriptyline (ELAVIL) 25 MG tablet Take 25-50 mg by mouth at bedtime. For sleep 03/08/21   [provider]  ?calcitRIOL (ROCALTROL) 0.25 MCG capsule Take 0.25 mcg by mouth daily. 11/16/20   [provider]  ?Deferiprone, Twice Daily, (FERRIPROX TWICE-A-DAY) 1000 MG TABS Take 1,000 mg by mouth 2 (two) times daily.    [provider]  ?folic acid (FOLVITE) 1 MG tablet TAKE 1 TABLET BY MOUTH EVERY DAY ?Patient taking differently: Take 1 mg by mouth daily. 03/22/20   [name], FNP  ?hydroxyurea (HYDREA) 500 MG capsule Take 500 mg by mouth daily. 05/07/20   [provider]   ?LOKELMA 10 g PACK packet Take 10 g by mouth every other day. 03/18/20   [provider]  ?OXBRYTA 500 MG TABS tablet Take 1,500 mg by mouth daily. 03/26/20   [provider]  ?oxyCODONE (OXYCONTIN) 15 mg 12 hr tablet Take 1 tablet (15 mg total) by mouth every 12 (twelve) hours. 06/08/21   [name], FNP  ?Oxycodone HCl 10 MG TABS Take 1 tablet (10 mg total) by mouth every 6 (six) hours as needed. 05/19/21   [name], FNP  ?sodium bicarbonate 650 MG tablet Take 1,950 mg by mouth 3 (three) times daily. 11/07/18   [provider]  ?   ? ?Allergies    ?Nsaids and Morphine and related   ? ?Review of Systems   ?Review of Systems  ?Constitutional:  Negative for appetite change and fatigue.  ?HENT:  Negative for congestion, ear discharge and sinus pressure.   ?Eyes:  Negative for discharge.  ?Respiratory:  Negative for cough.   ?Cardiovascular:  Negative for chest pain.  ?Gastrointestinal:  Negative for abdominal pain and diarrhea.  ?Genitourinary:  Negative for frequency and hematuria.  ?Musculoskeletal:  Negative for back pain.  ?     Pain in all extremities  ?Skin:  Negative for rash.  ?Neurological:  Negative for seizures and headaches.  ?Psychiatric/Behavioral:  Negative for hallucinations.   ? ?Physical Exam ?Updated Vital Signs ?BP 110/78   Pulse 76   Temp 98 ?F (  36.7 ?C) (Oral)   Resp 10   SpO2 96%  ?Physical Exam ?Constitutional:   ?   Appearance: He is well-developed. He is ill-appearing.  ?HENT:  ?   Head: Normocephalic.  ?Eyes:  ?   General: No scleral icterus. ?   Conjunctiva/sclera: Conjunctivae normal.  ?Neck:  ?   Thyroid: No thyromegaly.  ?Cardiovascular:  ?   Rate and Rhythm: Normal rate and regular rhythm.  ?   Heart sounds: No murmur heard. ?  No friction rub. No gallop.  ?Pulmonary:  ?   Breath sounds: No stridor. No wheezing or rales.  ?Chest:  ?   Chest wall: No tenderness.  ?Abdominal:  ?   General: There is no distension.  ?   Tenderness: There is no  abdominal tenderness. There is no rebound.  ?Musculoskeletal:     ?   General: Normal range of motion.  ?   Cervical back: Neck supple.  ?Lymphadenopathy:  ?   Cervical: No cervical adenopathy.  ?Skin: ?   Findings: No erythema or rash.  ?Neurological:  ?   Mental Status: He is oriented to person, place, and time.  ?   Motor: No abnormal muscle tone.  ?   Coordination: Coordination normal.  ?Psychiatric:     ?   Behavior: Behavior normal.  ? ? ?ED Results / Procedures / Treatments   ?Labs ?(all labs ordered are listed, but only abnormal results are displayed) ?Labs Reviewed  ?COMPREHENSIVE METABOLIC PANEL - Abnormal; Notable for the following components:  ?    Result Value  ? Glucose, Bld 103 (*)   ? BUN 33 (*)   ? Creatinine, Ser 3.49 (*)   ? Calcium 8.5 (*)   ? Alkaline Phosphatase 182 (*)   ? Total Bilirubin 2.3 (*)   ? GFR, Estimated 22 (*)   ? All other components within normal limits  ?CBC WITH DIFFERENTIAL/PLATELET - Abnormal; Notable for the following components:  ? WBC 19.6 (*)   ? RBC 2.15 (*)   ? Hemoglobin 8.1 (*)   ? HCT 22.3 (*)   ? MCV 103.7 (*)   ? MCH 37.7 (*)   ? MCHC 36.3 (*)   ? RDW 21.6 (*)   ? Neutro Abs 9.6 (*)   ? Lymphs Abs 7.9 (*)   ? Monocytes Absolute 1.3 (*)   ? Eosinophils Absolute 0.7 (*)   ? Abs Immature Granulocytes 0.08 (*)   ? All other components within normal limits  ?RETICULOCYTES - Abnormal; Notable for the following components:  ? Retic Ct Pct 11.0 (*)   ? RBC. 2.18 (*)   ? Retic Count, Absolute 239.1 (*)   ? Immature Retic Fract 32.1 (*)   ? All other components within normal limits  ? ? ?EKG ?None ? ?Radiology ?No results found. ? ?Procedures ?Procedures  ? ? ?Medications Ordered in ED ?Medications  ?sodium chloride 0.9 % bolus 1,000 mL (0 mLs Intravenous Stopped 06/08/21 1231)  ?HYDROmorphone (DILAUDID) injection 1 mg (1 mg Intravenous Given 06/08/21 1117)  ?ondansetron [facility]) injection 4 mg (4 mg Intravenous Given 06/08/21 1118)  ?HYDROmorphone (DILAUDID) injection 2 mg (2  mg Intravenous Given 06/08/21 1231)  ?HYDROmorphone (DILAUDID) injection 2 mg (2 mg Intravenous Given 06/08/21 1341)  ? ? ?ED Course/ Medical Decision Making/ A&P ? CRITICAL CARE ?Performed by: Milton Ferguson ?Total critical care time: 40 minutes ?Critical care time was exclusive of separately billable procedures and treating other patients. ?Critical care was necessary to treat or  prevent imminent or life-threatening deterioration. ?Critical care was time spent personally by me on the following activities: development of treatment plan with patient and/or surrogate as well as nursing, discussions with consultants, evaluation of patient's response to treatment, examination of patient, obtaining history from patient or surrogate, ordering and performing treatments and interventions, ordering and review of laboratory studies, ordering and review of radiographic studies, pulse oximetry and re-evaluation of patient's condition. ? ?                        ?Medical Decision Making ?Amount and/or Complexity of Data Reviewed ?Labs: ordered. ? ?Risk ?Prescription drug management. ? ?This patient presents to the ED for concern of sickle cell pain, this involves an extensive number of treatment options, and is a complaint that carries with it a high risk of complications and morbidity.  The differential diagnosis includes sickle cell pain ? ? ?Co morbidities that complicate the patient evaluation ? ?Sickle cell ? ? ?Additional history obtained: ? ?Additional history obtained from patient ?External records from outside source obtained and reviewed including hospital records ? ? ?Lab Tests: ? ?I Ordered, and personally interpreted labs.  The pertinent results include: Creatinine elevated 3.4 White count elevated at 19.6 hemoglobin low at 8.0 ? ? ?Imaging Studies ordered: ? ?No x-rays ? ?Cardiac Monitoring: / EKG: ? ?The patient was maintained on a cardiac monitor.  I personally viewed and interpreted the cardiac monitored which  showed an underlying rhythm of: Normal sinus rhythm ? ? ?Consultations Obtained: ? ?No consult ? ?Problem List / ED Course / Critical interventions / Medication management ? ?Sickle cell crisis ?I ordered medication inc

## 2021-06-09 ENCOUNTER — Other Ambulatory Visit (HOSPITAL_COMMUNITY): Payer: Self-pay

## 2021-06-10 ENCOUNTER — Other Ambulatory Visit (HOSPITAL_COMMUNITY): Payer: Self-pay

## 2021-06-14 ENCOUNTER — Telehealth: Payer: Self-pay

## 2021-06-14 ENCOUNTER — Other Ambulatory Visit (HOSPITAL_COMMUNITY): Payer: Self-pay

## 2021-06-14 ENCOUNTER — Other Ambulatory Visit: Payer: Self-pay | Admitting: Family Medicine

## 2021-06-14 DIAGNOSIS — D571 Sickle-cell disease without crisis: Secondary | ICD-10-CM

## 2021-06-14 DIAGNOSIS — G894 Chronic pain syndrome: Secondary | ICD-10-CM

## 2021-06-14 MED ORDER — OXYCODONE HCL 10 MG PO TABS
10.0000 mg | ORAL_TABLET | Freq: Four times a day (QID) | ORAL | 0 refills | Status: DC | PRN
  Filled 2021-06-14: qty 60, 15d supply, fill #0

## 2021-06-14 NOTE — Telephone Encounter (Signed)
Oxycodone 10mg

## 2021-06-14 NOTE — Progress Notes (Signed)
Reviewed PDMP substance reporting system prior to prescribing opiate medications. No inconsistencies noted.  Meds ordered this encounter  Medications   Oxycodone HCl 10 MG TABS    Sig: Take 1 tablet (10 mg total) by mouth every 6 (six) hours as needed.    Dispense:  60 tablet    Refill:  0    Order Specific Question:   Supervising Provider    Answer:   [name], [name] [[id number]]   [name] Therien Moore Dealva Lafoy  APRN, MSN, FNP-C [facility] [address]  [place], [place] [address] [phone]  

## 2021-06-17 ENCOUNTER — Other Ambulatory Visit (HOSPITAL_COMMUNITY): Payer: Self-pay

## 2021-06-23 ENCOUNTER — Encounter (HOSPITAL_COMMUNITY): Payer: Self-pay

## 2021-06-23 ENCOUNTER — Emergency Department (HOSPITAL_COMMUNITY): Payer: Medicare Other

## 2021-06-23 ENCOUNTER — Inpatient Hospital Stay (HOSPITAL_COMMUNITY)
Admission: EM | Admit: 2021-06-23 | Discharge: 2021-06-25 | DRG: 812 | Disposition: A | Discharge status: Home / Self Care | Payer: Medicare Other | Attending: Internal Medicine | Admitting: Internal Medicine

## 2021-06-23 ENCOUNTER — Other Ambulatory Visit: Payer: Self-pay

## 2021-06-23 DIAGNOSIS — N184 Chronic kidney disease, stage 4 (severe): Secondary | ICD-10-CM | POA: Diagnosis present

## 2021-06-23 DIAGNOSIS — R0602 Shortness of breath: Secondary | ICD-10-CM | POA: Diagnosis present

## 2021-06-23 DIAGNOSIS — Z79899 Other long term (current) drug therapy: Secondary | ICD-10-CM

## 2021-06-23 DIAGNOSIS — D5700 Hb-SS disease with crisis, unspecified: Principal | ICD-10-CM | POA: Diagnosis present

## 2021-06-23 DIAGNOSIS — D72829 Elevated white blood cell count, unspecified: Secondary | ICD-10-CM | POA: Diagnosis present

## 2021-06-23 DIAGNOSIS — Z886 Allergy status to analgesic agent status: Secondary | ICD-10-CM | POA: Diagnosis not present

## 2021-06-23 DIAGNOSIS — G894 Chronic pain syndrome: Secondary | ICD-10-CM | POA: Diagnosis present

## 2021-06-23 DIAGNOSIS — N179 Acute kidney failure, unspecified: Secondary | ICD-10-CM | POA: Diagnosis present

## 2021-06-23 DIAGNOSIS — Z885 Allergy status to narcotic agent status: Secondary | ICD-10-CM

## 2021-06-23 DIAGNOSIS — N189 Chronic kidney disease, unspecified: Secondary | ICD-10-CM | POA: Diagnosis not present

## 2021-06-23 DIAGNOSIS — E875 Hyperkalemia: Secondary | ICD-10-CM | POA: Diagnosis present

## 2021-06-23 DIAGNOSIS — Z87891 Personal history of nicotine dependence: Secondary | ICD-10-CM

## 2021-06-23 LAB — URINALYSIS, ROUTINE W REFLEX MICROSCOPIC
Bacteria, UA: NONE SEEN
Bilirubin Urine: NEGATIVE
Glucose, UA: 50 mg/dL — AB
Ketones, ur: NEGATIVE mg/dL
Leukocytes,Ua: NEGATIVE
Nitrite: NEGATIVE
Protein, ur: 100 mg/dL — AB
Specific Gravity, Urine: 1.01 (ref 1.005–1.030)
pH: 7 (ref 5.0–8.0)

## 2021-06-23 LAB — CBC WITH DIFFERENTIAL/PLATELET
Abs Immature Granulocytes: 0.07 10*3/uL (ref 0.00–0.07)
Basophils Absolute: 0 10*3/uL (ref 0.0–0.1)
Basophils Relative: 0 %
Eosinophils Absolute: 0.4 10*3/uL (ref 0.0–0.5)
Eosinophils Relative: 2 %
HCT: 24.2 % — ABNORMAL LOW (ref 39.0–52.0)
Hemoglobin: 8.7 g/dL — ABNORMAL LOW (ref 13.0–17.0)
Immature Granulocytes: 0 %
Lymphocytes Relative: 44 %
Lymphs Abs: 7.9 10*3/uL — ABNORMAL HIGH (ref 0.7–4.0)
MCH: 39.2 pg — ABNORMAL HIGH (ref 26.0–34.0)
MCHC: 36 g/dL (ref 30.0–36.0)
MCV: 109 fL — ABNORMAL HIGH (ref 80.0–100.0)
Monocytes Absolute: 1.4 10*3/uL — ABNORMAL HIGH (ref 0.1–1.0)
Monocytes Relative: 8 %
Neutro Abs: 8.1 10*3/uL — ABNORMAL HIGH (ref 1.7–7.7)
Neutrophils Relative %: 46 %
Platelets: 267 10*3/uL (ref 150–400)
RBC: 2.22 MIL/uL — ABNORMAL LOW (ref 4.22–5.81)
RDW: 23.7 % — ABNORMAL HIGH (ref 11.5–15.5)
WBC: 17.9 10*3/uL — ABNORMAL HIGH (ref 4.0–10.5)
nRBC: 0.2 % (ref 0.0–0.2)

## 2021-06-23 LAB — COMPREHENSIVE METABOLIC PANEL
ALT: 59 U/L — ABNORMAL HIGH (ref 0–44)
AST: 83 U/L — ABNORMAL HIGH (ref 15–41)
Albumin: 3.7 g/dL (ref 3.5–5.0)
Alkaline Phosphatase: 245 U/L — ABNORMAL HIGH (ref 38–126)
Anion gap: 11 (ref 5–15)
BUN: 57 mg/dL — ABNORMAL HIGH (ref 6–20)
CO2: 20 mmol/L — ABNORMAL LOW (ref 22–32)
Calcium: 8.7 mg/dL — ABNORMAL LOW (ref 8.9–10.3)
Chloride: 106 mmol/L (ref 98–111)
Creatinine, Ser: 4.28 mg/dL — ABNORMAL HIGH (ref 0.61–1.24)
GFR, Estimated: 17 mL/min — ABNORMAL LOW (ref >60)
Glucose, Bld: 110 mg/dL — ABNORMAL HIGH (ref 70–99)
Potassium: 5.6 mmol/L — ABNORMAL HIGH (ref 3.5–5.1)
Sodium: 137 mmol/L (ref 135–145)
Total Bilirubin: 1.5 mg/dL — ABNORMAL HIGH (ref 0.3–1.2)
Total Protein: 7.6 g/dL (ref 6.5–8.1)

## 2021-06-23 LAB — RETICULOCYTES
Immature Retic Fract: 24.4 % — ABNORMAL HIGH (ref 2.3–15.9)
RBC.: 2.2 MIL/uL — ABNORMAL LOW (ref 4.22–5.81)
Retic Count, Absolute: 195.1 10*3/uL — ABNORMAL HIGH (ref 19.0–186.0)
Retic Ct Pct: 8.9 % — ABNORMAL HIGH (ref 0.4–3.1)

## 2021-06-23 MED ORDER — VOXELOTOR 500 MG PO TABS: 1500.0000 mg | ORAL_TABLET | Freq: Every day | ORAL | Status: DC

## 2021-06-23 MED ORDER — ACETAMINOPHEN ER 650 MG PO TBCR: 650.0000 mg | EXTENDED_RELEASE_TABLET | Freq: Three times a day (TID) | ORAL | Status: DC | PRN

## 2021-06-23 MED ORDER — SODIUM CHLORIDE 0.9 % IV SOLN
12.5000 mg | Freq: Once | INTRAVENOUS | Status: AC
  Administered 2021-06-23: 12.5 mg via INTRAVENOUS
  Filled 2021-06-23: qty 12.5

## 2021-06-23 MED ORDER — SENNOSIDES-DOCUSATE SODIUM 8.6-50 MG PO TABS
1.0000 | ORAL_TABLET | Freq: Two times a day (BID) | ORAL | Status: DC
  Filled 2021-06-23 (×2): qty 1

## 2021-06-23 MED ORDER — HYDROXYUREA 500 MG PO CAPS: 500.0000 mg | ORAL_CAPSULE | Freq: Every day | ORAL | Status: DC

## 2021-06-23 MED ORDER — HYDROMORPHONE 1 MG/ML IV SOLN
INTRAVENOUS | Status: DC
  Administered 2021-06-23: 3.5 mg via INTRAVENOUS
  Administered 2021-06-23: 30 mg via INTRAVENOUS
  Administered 2021-06-24: 1 mg via INTRAVENOUS
  Administered 2021-06-24: 6 mg via INTRAVENOUS
  Administered 2021-06-24: 5.5 mg via INTRAVENOUS
  Filled 2021-06-23: qty 30

## 2021-06-23 MED ORDER — SODIUM CHLORIDE 0.45 % IV SOLN: INTRAVENOUS | Status: DC

## 2021-06-23 MED ORDER — SODIUM CHLORIDE 0.9% FLUSH: 9.0000 mL | INTRAVENOUS | Status: DC | PRN

## 2021-06-23 MED ORDER — HYDROMORPHONE HCL 2 MG/ML IJ SOLN
2.0000 mg | INTRAMUSCULAR | Status: AC
  Administered 2021-06-23: 2 mg via INTRAVENOUS
  Filled 2021-06-23: qty 1

## 2021-06-23 MED ORDER — HYDROMORPHONE HCL 1 MG/ML IJ SOLN: 0.5000 mg | INTRAMUSCULAR | Status: DC

## 2021-06-23 MED ORDER — ONDANSETRON HCL 4 MG/2ML IJ SOLN
4.0000 mg | INTRAMUSCULAR | Status: DC | PRN
  Administered 2021-06-23: 4 mg via INTRAVENOUS
  Filled 2021-06-23: qty 2

## 2021-06-23 MED ORDER — HYDROMORPHONE HCL 1 MG/ML IJ SOLN
0.5000 mg | INTRAMUSCULAR | Status: AC
  Administered 2021-06-23: 0.5 mg via SUBCUTANEOUS
  Filled 2021-06-23: qty 1

## 2021-06-23 MED ORDER — DEFERIPRONE (TWICE DAILY) 1000 MG PO TABS: 1000.0000 mg | ORAL_TABLET | Freq: Two times a day (BID) | ORAL | Status: DC

## 2021-06-23 MED ORDER — ACETAMINOPHEN 325 MG PO TABS
650.0000 mg | ORAL_TABLET | Freq: Three times a day (TID) | ORAL | Status: DC | PRN
  Administered 2021-06-25: 650 mg via ORAL
  Filled 2021-06-23: qty 2

## 2021-06-23 MED ORDER — SODIUM ZIRCONIUM CYCLOSILICATE 10 G PO PACK
10.0000 g | PACK | Freq: Once | ORAL | Status: AC
  Administered 2021-06-23: 10 g via ORAL
  Filled 2021-06-23: qty 1

## 2021-06-23 MED ORDER — SODIUM BICARBONATE 650 MG PO TABS
1950.0000 mg | ORAL_TABLET | Freq: Three times a day (TID) | ORAL | Status: DC
  Administered 2021-06-23 – 2021-06-24 (×4): 1950 mg via ORAL
  Filled 2021-06-23 (×4): qty 3

## 2021-06-23 MED ORDER — DIPHENHYDRAMINE HCL 25 MG PO CAPS
25.0000 mg | ORAL_CAPSULE | ORAL | Status: DC | PRN
  Administered 2021-06-25: 25 mg via ORAL
  Filled 2021-06-23: qty 1

## 2021-06-23 MED ORDER — OXYCODONE HCL ER 15 MG PO T12A
15.0000 mg | EXTENDED_RELEASE_TABLET | Freq: Two times a day (BID) | ORAL | Status: DC | PRN
  Administered 2021-06-23 – 2021-06-24 (×2): 15 mg via ORAL
  Filled 2021-06-23 (×3): qty 1

## 2021-06-23 MED ORDER — POLYETHYLENE GLYCOL 3350 17 G PO PACK: 17.0000 g | PACK | Freq: Every day | ORAL | Status: DC | PRN

## 2021-06-23 MED ORDER — PATIROMER SORBITEX CALCIUM 8.4 G PO PACK
1.0000 | PACK | Freq: Every day | ORAL | Status: DC
  Administered 2021-06-24: 1 via ORAL
  Filled 2021-06-23 (×3): qty 1

## 2021-06-23 MED ORDER — NALOXONE HCL 0.4 MG/ML IJ SOLN: 0.4000 mg | INTRAMUSCULAR | Status: DC | PRN

## 2021-06-23 MED ORDER — ONDANSETRON HCL 4 MG/2ML IJ SOLN: 4.0000 mg | Freq: Four times a day (QID) | INTRAMUSCULAR | Status: DC | PRN

## 2021-06-23 MED ORDER — FOLIC ACID 1 MG PO TABS
1.0000 mg | ORAL_TABLET | Freq: Every day | ORAL | Status: DC
  Administered 2021-06-24: 1 mg via ORAL
  Filled 2021-06-23: qty 1

## 2021-06-23 MED ORDER — OXYCODONE HCL 5 MG PO TABS
10.0000 mg | ORAL_TABLET | Freq: Four times a day (QID) | ORAL | Status: DC | PRN
  Administered 2021-06-23 – 2021-06-24 (×3): 10 mg via ORAL
  Filled 2021-06-23 (×3): qty 2

## 2021-06-23 MED ORDER — AMITRIPTYLINE HCL 25 MG PO TABS
50.0000 mg | ORAL_TABLET | Freq: Every day | ORAL | Status: DC
  Administered 2021-06-23 – 2021-06-24 (×2): 50 mg via ORAL
  Filled 2021-06-23 (×2): qty 2

## 2021-06-23 MED ORDER — CALCITRIOL 0.25 MCG PO CAPS
0.2500 ug | ORAL_CAPSULE | Freq: Every day | ORAL | Status: DC
  Administered 2021-06-24: 0.25 ug via ORAL
  Filled 2021-06-23 (×2): qty 1

## 2021-06-23 MED ORDER — HEPARIN SODIUM (PORCINE) 5000 UNIT/ML IJ SOLN
5000.0000 [IU] | Freq: Three times a day (TID) | INTRAMUSCULAR | Status: DC
  Filled 2021-06-23: qty 1

## 2021-06-23 NOTE — ED Provider Notes (Signed)
?[facility] [place] DEPT ?Provider Note ? ? ?CSN: [id number] ?Arrival date & time: 06/23/21  1057 ? ?  ? ?History ? ?Chief Complaint  ?Patient presents with  ? Sickle Cell Pain Crisis  ? Shortness of Breath  ? Emesis  ? ? ?[name] [name]. is a 41 y.o. male.  He has a history of sickle cell disease and CKD.  He is complaining of pain in his back and his legs along with vomiting and unable to keep his medications down starting today.  Typical of sickle cell crisis.  He does feel short of breath but no chest pain no fevers or chills no cough. ? ?The history is provided by the patient.  ?Sickle Cell Pain Crisis ?Location:  Back and lower extremity ?Severity:  Severe ?Onset quality:  Gradual ?Duration:  1 day ?Similar to previous crisis episodes: yes   ?Timing:  Constant ?Progression:  Unchanged ?Chronicity:  Recurrent ?Relieved by:  Nothing ?Worsened by:  Activity and movement ?Ineffective treatments:  Prescription drugs ?Associated symptoms: nausea and vomiting   ?Associated symptoms: no chest pain, no fever, no shortness of breath and no sore throat   ?Risk factors: frequent pain crises   ?Emesis ?Associated symptoms: myalgias   ?Associated symptoms: no abdominal pain, no fever and no sore throat   ? ?  ? ?Home Medications ?Prior to Admission medications   ?Medication Sig Start Date End Date Taking? Authorizing Provider  ?acetaminophen (TYLENOL) 650 MG CR tablet Take 650 mg by mouth every 8 (eight) hours as needed for pain.    [provider]  ?amitriptyline (ELAVIL) 25 MG tablet Take 25-50 mg by mouth at bedtime. For sleep 03/08/21   [provider]  ?calcitRIOL (ROCALTROL) 0.25 MCG capsule Take 0.25 mcg by mouth daily. 11/16/20   [provider]  ?Deferiprone, Twice Daily, (FERRIPROX TWICE-A-DAY) 1000 MG TABS Take 1,000 mg by mouth 2 (two) times daily.    [provider]  ?folic acid (FOLVITE) 1 MG tablet TAKE 1 TABLET BY MOUTH EVERY DAY ?Patient taking  differently: Take 1 mg by mouth daily. 03/22/20   [name], FNP  ?hydroxyurea (HYDREA) 500 MG capsule Take 500 mg by mouth daily. 05/07/20   [provider]  ?LOKELMA 10 g PACK packet Take 10 g by mouth every other day. 03/18/20   [provider]  ?OXBRYTA 500 MG TABS tablet Take 1,500 mg by mouth daily. 03/26/20   [provider]  ?oxyCODONE (OXYCONTIN) 15 mg 12 hr tablet Take 1 tablet (15 mg total) by mouth every 12 (twelve) hours. 06/08/21   [name], FNP  ?Oxycodone HCl 10 MG TABS Take 1 tablet (10 mg total) by mouth every 6 (six) hours as needed. 06/14/21   [name], FNP  ?sodium bicarbonate 650 MG tablet Take 1,950 mg by mouth 3 (three) times daily. 11/07/18   [provider]  ?VELTASSA 8.4 g packet Take 1 packet by mouth daily. 05/23/21   [provider]  ?   ? ?Allergies    ?Nsaids and Morphine and related   ? ?Review of Systems   ?Review of Systems  ?Constitutional:  Negative for fever.  ?HENT:  Negative for sore throat.   ?Eyes:  Negative for visual disturbance.  ?Respiratory:  Negative for shortness of breath.   ?Cardiovascular:  Negative for chest pain.  ?Gastrointestinal:  Positive for nausea and vomiting. Negative for abdominal pain.  ?Genitourinary:  Negative for dysuria.  ?Musculoskeletal:  Positive for  back pain and myalgias.  ?Skin:  Negative for rash.  ? ?Physical Exam ?Updated Vital Signs ?BP 111/75   Pulse 96   Temp 98.5 ?F (36.9 ?C) (Oral)   Resp 14   Ht 5\' 3"  (1.6 m)   Wt 56.7 kg   SpO2 95%   BMI 22.14 kg/m?  ?Physical Exam ?Vitals and nursing note reviewed.  ?Constitutional:   ?   General: He is not in acute distress. ?   Appearance: He is well-developed.  ?HENT:  ?   Head: Normocephalic and atraumatic.  ?Eyes:  ?   Conjunctiva/sclera: Conjunctivae normal.  ?Cardiovascular:  ?   Rate and Rhythm: Normal rate and regular rhythm.  ?   Heart sounds: No murmur heard. ?Pulmonary:  ?   Effort: Pulmonary effort is normal. No  respiratory distress.  ?   Breath sounds: Normal breath sounds.  ?Abdominal:  ?   Palpations: Abdomen is soft.  ?   Tenderness: There is no abdominal tenderness.  ?Musculoskeletal:     ?   General: No swelling. Normal range of motion.  ?   Cervical back: Neck supple.  ?Skin: ?   General: Skin is warm and dry.  ?   Capillary Refill: Capillary refill takes less than 2 seconds.  ?Neurological:  ?   General: No focal deficit present.  ?   Mental Status: He is alert.  ? ? ?ED Results / Procedures / Treatments   ?Labs ?(all labs ordered are listed, but only abnormal results are displayed) ?Labs Reviewed  ?COMPREHENSIVE METABOLIC PANEL - Abnormal; Notable for the following components:  ?    Result Value  ? Potassium 5.6 (*)   ? CO2 20 (*)   ? Glucose, Bld 110 (*)   ? BUN 57 (*)   ? Creatinine, Ser 4.28 (*)   ? Calcium 8.7 (*)   ? AST 83 (*)   ? ALT 59 (*)   ? Alkaline Phosphatase 245 (*)   ? Total Bilirubin 1.5 (*)   ? GFR, Estimated 17 (*)   ? All other components within normal limits  ?CBC WITH DIFFERENTIAL/PLATELET - Abnormal; Notable for the following components:  ? WBC 17.9 (*)   ? RBC 2.22 (*)   ? Hemoglobin 8.7 (*)   ? HCT 24.2 (*)   ? MCV 109.0 (*)   ? MCH 39.2 (*)   ? RDW 23.7 (*)   ? Neutro Abs 8.1 (*)   ? Lymphs Abs 7.9 (*)   ? Monocytes Absolute 1.4 (*)   ? All other components within normal limits  ?RETICULOCYTES - Abnormal; Notable for the following components:  ? Retic Ct Pct 8.9 (*)   ? RBC. 2.20 (*)   ? Retic Count, Absolute 195.1 (*)   ? Immature Retic Fract 24.4 (*)   ? All other components within normal limits  ?URINALYSIS, ROUTINE W REFLEX MICROSCOPIC  ? ? ?EKG ?None ? ?Radiology ?DG Chest 2 View ? ?Result Date: 06/23/2021 ?CLINICAL DATA:  Sickle cell disease. Complains of sickle cell pain and bilateral arms and shortness of breath. EXAM: CHEST - 2 VIEW COMPARISON:  02/06/2021 FINDINGS: Stable cardiomediastinal contours. No signs of pleural effusion or edema. Chronic scarring is identified within the  retrocardiac left lung base. No superimposed airspace consolidation. Bony stigmata of sickle cell disease is again noted with diffuse osseous sclerosis and avascular necrosis within both humeral heads. IMPRESSION: No acute cardiopulmonary abnormalities. Chronic changes of sickle cell disease as described above. Electronically Signed  By: Kerby Moors M.D.   On: 06/23/2021 12:24   ? ?Procedures ?Marland KitchenCritical Care ?Performed by: Hayden Rasmussen, MD ?Authorized by: Hayden Rasmussen, MD  ? ?Critical care provider statement:  ?  Critical care time (minutes):  45 ?  Critical care time was exclusive of:  Separately billable procedures and treating other patients ?  Critical care was necessary to treat or prevent imminent or life-threatening deterioration of the following conditions:  Renal failure and metabolic crisis ?  Critical care was time spent personally by me on the following activities:  Development of treatment plan with patient or surrogate, discussions with consultants, evaluation of patient's response to treatment, examination of patient, obtaining history from patient or surrogate, ordering and performing treatments and interventions, ordering and review of laboratory studies, ordering and review of radiographic studies, pulse oximetry, re-evaluation of patient's condition and review of old charts ?  I assumed direction of critical care for this patient from another provider in my specialty: no    ? ? ?Medications Ordered in ED ?Medications  ?HYDROmorphone (DILAUDID) injection 0.5 mg (has no administration in time range)  ?HYDROmorphone (DILAUDID) injection 0.5 mg (has no administration in time range)  ?0.45 % sodium chloride infusion (has no administration in time range)  ?HYDROmorphone (DILAUDID) injection 2 mg (has no administration in time range)  ?HYDROmorphone (DILAUDID) injection 2 mg (has no administration in time range)  ?HYDROmorphone (DILAUDID) injection 2 mg (has no administration in time range)   ?diphenhydrAMINE (BENADRYL) 12.5 mg in sodium chloride 0.9 % 50 mL IVPB (has no administration in time range)  ?ondansetron (ZOFRAN) injection 4 mg (has no administration in time range)  ? ? ?ED Course/ Medical Decis

## 2021-06-23 NOTE — ED Triage Notes (Signed)
Patient reports that he has been having sickle cell pain in bilateral arms, legs, and lower back pain x 2 days.. ? ?Patient also c/o SOB ? ?Patient states his urine is very dark in color. ?

## 2021-06-23 NOTE — H&P (Signed)
?History and Physical  ? ? ?[name] A Furtick Jr. JHE:[id number] DOB: [birth year] DOA: 06/23/2021 ? ?PCP: [name], FNP  ? ?Patient coming from: Home ? ?Chief Complaint: Back and extremity pain with nausea and vomiting ? ?HPI: [name] [name]. is a 41 y.o. male with medical history significant for sickle cell disease, CKD stage IV, chronic anemia related to sickle cell, chronic pain syndrome with opiate dependence and tolerance, history of polysubstance abuse, history of tobacco abuse, and history of migraine headaches who presented to the ED with pain in his low back and legs as well as his arms accompanied with nausea and vomiting that started approximately 1 day prior.  He has not been able to keep down his home medications and states that this pain and presentation is typical for her sickle cell crisis.  He does not feel short of breath nor does he have any chest pain, fevers, or chills. ?  ?ED Course: Patient has been given multiple rounds of Dilaudid in the ED and continues to have pain level 6/10.  He states that his nausea has improved some at this time.  Chest x-ray with no acute findings.  EKG with normal sinus rhythm and no changes to reflect hyperkalemia.  Creatinine is 4.28 and potassium is 5.6.  Hemoglobin is near baseline around 8 and he is noted to have some leukocytosis. ? ?Review of Systems: Reviewed as noted above, otherwise negative. ? ?Past Medical History:  ?Diagnosis Date  ? Abscess of right iliac fossa 09/24/2018  ? required Perc Drain 09/24/2018  ? Arachnoid Cyst of brain bilaterally   ? "2 really small ones in the back of my head; inside; saw them w/MRI" (09/25/2012)  ? Bacterial pneumonia ~ 2012  ? "caught it here in the hospital" (09/25/2012)  ? Chronic kidney disease   ? "from my sickle cell" (09/25/2012)  ? CKD (chronic kidney disease), stage II   ? Colitis 04/19/2017  ? CT scan abd/ pelvis  ? GERD (gastroesophageal reflux disease)   ? "after I eat alot of spicey foods"  (09/25/2012)  ? Gynecomastia, male 07/10/2012  ? History of blood transfusion   ? "always related to sickle cell crisis" (09/25/2012)  ? 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  ? Noted in noted from H[facility] at [facility]  ? Migraines   ? "take RX qd to prevent them" (09/25/2012)  ? Opioid dependence with withdrawal ([place]) 08/30/2012  ? Renal insufficiency   ? Sickle cell anemia (HCC)   ? Sickle cell crisis ([place]) 09/25/2012  ? Sickle cell nephropathy ([place]) 07/10/2012  ? Sinus tachycardia   ? Tachycardia with heart rate 121-140 beats per minute with ambulation 08/04/2016  ? ? ?Past Surgical History:  ?Procedure Laterality Date  ? ABCESS DRAINAGE    ? CHOLECYSTECTOMY  ~ 2012  ? COLONOSCOPY N/A 04/23/2017  ? Procedure: COLONOSCOPY;  Surgeon: Irene Shipper, MD;  Location: [facility];  Service: Endoscopy;  Laterality: N/A;  ? IR FLUORO GUIDE CV LINE RIGHT  12/17/2016  ? IR FLUORO GUIDE CV LINE RIGHT  02/07/2021  ? IR RADIOLOGIST EVAL & MGMT  10/02/2018  ? IR RADIOLOGIST EVAL & MGMT  10/15/2018  ? IR REMOVAL TUN CV CATH W/O FL  12/21/2016  ? IR REMOVAL TUN CV CATH W/O FL  02/11/2021  ? IR US GUIDE VASC ACCESS RIGHT  12/17/2016  ? IR US GUIDE VASC ACCESS RIGHT  02/07/2021  ? spleenectomy    ? ? ? reports that he has  quit smoking. His smoking use included cigarettes. He smoked an average of .1 packs per day. He has never used smokeless tobacco. He reports current alcohol use. He reports current drug use. Drug: Marijuana. ? ?Allergies  ?Allergen Reactions  ? Nsaids Other (See Comments)  ?  Kidney disease  ? Morphine And Related Other (See Comments)  ?  "Real bad headaches"   ? ? ?Family History  ?Problem Relation Age of Onset  ? Breast cancer Mother   ? ? ?Prior to Admission medications   ?Medication Sig Start Date End Date Taking? Authorizing Provider  ?acetaminophen (TYLENOL) 650 MG CR tablet Take 650 mg by mouth every 8 (eight) hours as needed for pain.   Yes [provider]  ?amitriptyline (ELAVIL) 25 MG  tablet Take 50 mg by mouth at bedtime. 03/08/21  Yes [provider]  ?calcitRIOL (ROCALTROL) 0.25 MCG capsule Take 0.25 mcg by mouth daily. 11/16/20  Yes [provider]  ?Deferiprone, Twice Daily, (FERRIPROX TWICE-A-DAY) 1000 MG TABS Take 1,000 mg by mouth 2 (two) times daily.   Yes [provider]  ?folic acid (FOLVITE) 1 MG tablet TAKE 1 TABLET BY MOUTH EVERY DAY ?Patient taking differently: Take 1 mg by mouth daily. 03/22/20  Yes [name], FNP  ?hydroxyurea (HYDREA) 500 MG capsule Take 500 mg by mouth daily. 05/07/20  Yes [provider]  ?OXBRYTA 500 MG TABS tablet Take 1,500 mg by mouth daily. 03/26/20  Yes [provider]  ?oxyCODONE (OXYCONTIN) 15 mg 12 hr tablet Take 1 tablet (15 mg total) by mouth every 12 (twelve) hours. ?Patient taking differently: Take 15 mg by mouth every 12 (twelve) hours as needed (pain). 06/08/21  Yes [name], FNP  ?Oxycodone HCl 10 MG TABS Take 1 tablet (10 mg total) by mouth every 6 (six) hours as needed. 06/14/21  Yes [name], FNP  ?sodium bicarbonate 650 MG tablet Take 1,950 mg by mouth 3 (three) times daily. 11/07/18  Yes [provider]  ?VELTASSA 8.4 g packet Take 1 packet by mouth daily. 05/23/21  Yes [provider]  ? ? ?Physical Exam: ?Vitals:  ? 06/23/21 1400 06/23/21 1430 06/23/21 1530 06/23/21 1600  ?BP: 110/80 120/80 119/81 115/81  ?Pulse: 87 85  89  ?Resp: 12 14 13 11   ?Temp:      ?TempSrc:      ?SpO2: 96% 95%  95%  ?Weight:      ?Height:      ? ? ?Constitutional: NAD, calm, comfortable ?Vitals:  ? 06/23/21 1400 06/23/21 1430 06/23/21 1530 06/23/21 1600  ?BP: 110/80 120/80 119/81 115/81  ?Pulse: 87 85  89  ?Resp: 12 14 13 11   ?Temp:      ?TempSrc:      ?SpO2: 96% 95%  95%  ?Weight:      ?Height:      ? ?Eyes: lids and conjunctivae normal ?Neck: normal, supple ?Respiratory: clear to auscultation bilaterally. Normal respiratory effort. No accessory muscle use.  ?Cardiovascular: Regular  rate and rhythm, no murmurs. ?Abdomen: no tenderness, no distention. Bowel sounds positive.  ?Musculoskeletal:  No edema. ?Skin: no rashes, lesions, ulcers.  ?Psychiatric: Flat affect ? ?Labs on Admission: I have personally reviewed following labs and imaging studies ? ?CBC: ?Recent Labs  ?Lab 06/23/21 ?1159  ?WBC 17.9*  ?NEUTROABS 8.1*  ?HGB 8.7*  ?HCT 24.2*  ?MCV 109.0*  ?PLT 267  ? ?Basic Metabolic Panel: ?Recent Labs  ?Lab 06/23/21 ?1159  ?NA 137  ?K 5.6*  ?  CL 106  ?CO2 20*  ?GLUCOSE 110*  ?BUN 57*  ?CREATININE 4.28*  ?CALCIUM 8.7*  ? ?GFR: ?Estimated Creatinine Clearance: 18.8 mL/min (A) (by C-G formula based on SCr of 4.28 mg/dL (H)). ?Liver Function Tests: ?Recent Labs  ?Lab 06/23/21 ?1159  ?AST 83*  ?ALT 59*  ?ALKPHOS 245*  ?BILITOT 1.5*  ?PROT 7.6  ?ALBUMIN 3.7  ? ?No results for input(s): LIPASE, AMYLASE in the last 168 hours. ?No results for input(s): AMMONIA in the last 168 hours. ?Coagulation Profile: ?No results for input(s): INR, PROTIME in the last 168 hours. ?Cardiac Enzymes: ?No results for input(s): CKTOTAL, CKMB, CKMBINDEX, TROPONINI in the last 168 hours. ?BNP (last 3 results) ?No results for input(s): PROBNP in the last 8760 hours. ?HbA1C: ?No results for input(s): HGBA1C in the last 72 hours. ?CBG: ?No results for input(s): GLUCAP in the last 168 hours. ?Lipid Profile: ?No results for input(s): CHOL, HDL, LDLCALC, TRIG, CHOLHDL, LDLDIRECT in the last 72 hours. ?Thyroid Function Tests: ?No results for input(s): TSH, T4TOTAL, FREET4, T3FREE, THYROIDAB in the last 72 hours. ?Anemia Panel: ?Recent Labs  ?  06/23/21 ?1159  ?RETICCTPCT 8.9*  ? ?Urine analysis: ?   ?Component Value Date/Time  ? [place] YELLOW 04/12/2021 0149  ? APPEARANCEUR CLEAR 04/12/2021 0149  ? LABSPEC 1.009 04/12/2021 0149  ? PHURINE 7.0 04/12/2021 0149  ? GLUCOSEU 50 (A) 04/12/2021 0149  ? HGBUR SMALL (A) 04/12/2021 0149  ? HGBUR small 03/10/2010 1445  ? BILIRUBINUR negative 04/19/2021 1217  ? BILIRUBINUR neg 09/30/2020 1117   ? KETONESUR negative 04/19/2021 1217  ? KETONESUR 5 (A) 04/12/2021 0149  ? PROTEINUR 100 (A) 04/12/2021 0149  ? UROBILINOGEN 0.2 04/19/2021 1217  ? UROBILINOGEN 0.2 05/11/2017 1335  ? NITRITE Negative 03/07/

## 2021-06-24 DIAGNOSIS — N179 Acute kidney failure, unspecified: Secondary | ICD-10-CM | POA: Diagnosis not present

## 2021-06-24 DIAGNOSIS — N189 Chronic kidney disease, unspecified: Secondary | ICD-10-CM | POA: Diagnosis not present

## 2021-06-24 LAB — BASIC METABOLIC PANEL
Anion gap: 8 (ref 5–15)
BUN: 45 mg/dL — ABNORMAL HIGH (ref 6–20)
CO2: 20 mmol/L — ABNORMAL LOW (ref 22–32)
Calcium: 8.5 mg/dL — ABNORMAL LOW (ref 8.9–10.3)
Chloride: 106 mmol/L (ref 98–111)
Creatinine, Ser: 3.65 mg/dL — ABNORMAL HIGH (ref 0.61–1.24)
GFR, Estimated: 21 mL/min — ABNORMAL LOW (ref >60)
Glucose, Bld: 86 mg/dL (ref 70–99)
Potassium: 4.7 mmol/L (ref 3.5–5.1)
Sodium: 134 mmol/L — ABNORMAL LOW (ref 135–145)

## 2021-06-24 LAB — CBC
HCT: 20.2 % — ABNORMAL LOW (ref 39.0–52.0)
Hemoglobin: 6.9 g/dL — CL (ref 13.0–17.0)
MCH: 38.1 pg — ABNORMAL HIGH (ref 26.0–34.0)
MCHC: 34.2 g/dL (ref 30.0–36.0)
MCV: 111.6 fL — ABNORMAL HIGH (ref 80.0–100.0)
Platelets: 212 10*3/uL (ref 150–400)
RBC: 1.81 MIL/uL — ABNORMAL LOW (ref 4.22–5.81)
RDW: 23.9 % — ABNORMAL HIGH (ref 11.5–15.5)
WBC: 21.8 10*3/uL — ABNORMAL HIGH (ref 4.0–10.5)
nRBC: 0.1 % (ref 0.0–0.2)

## 2021-06-24 LAB — PREPARE RBC (CROSSMATCH)

## 2021-06-24 MED ORDER — SODIUM CHLORIDE 0.9% IV SOLUTION
Freq: Once | INTRAVENOUS | Status: AC
Start: 2021-06-24 — End: 2021-06-25

## 2021-06-24 MED ORDER — HYDROMORPHONE 1 MG/ML IV SOLN
INTRAVENOUS | Status: DC
  Administered 2021-06-24: 30 mg via INTRAVENOUS
  Administered 2021-06-24: 5.4 mg via INTRAVENOUS
  Administered 2021-06-24: 4.8 mg via INTRAVENOUS
  Administered 2021-06-24: 6.6 mg via INTRAVENOUS
  Administered 2021-06-25: 2.4 mg via INTRAVENOUS
  Administered 2021-06-25: 8.4 mg via INTRAVENOUS
  Filled 2021-06-24: qty 30

## 2021-06-24 NOTE — Progress Notes (Signed)
?PROGRESS NOTE ? ? ? ?[name] A Salce Jr.  NWG:[id number] DOB: Jun 02, 1980 DOA: 06/23/2021 ?PCP: [name], FNP ? ? ?Brief Narrative:  ?  ? [name] Ishida. is a 41 y.o. male with medical history significant for sickle cell disease, CKD stage IV, chronic anemia related to sickle cell, chronic pain syndrome with opiate dependence and tolerance, history of polysubstance abuse, history of tobacco abuse, and history of migraine headaches who presented to the ED with pain in his low back and legs as well as his arms accompanied with nausea and vomiting that started approximately 1 day prior. He was admitted with sickle cell crisis along with AKI on CKD4 and associated hyperkalemia. He is now having worsening anemia that will require transfusion that is pending arrival from [facility] due to presence of antibodies. ? ?Assessment & Plan: ?  ?Active Problems: ?  CKD (chronic kidney disease), stage IV ([place]) ?  Hyperkalemia ?  Leukocytosis ?  Sickle cell crisis ([place]) ?  Acute kidney injury superimposed on chronic kidney disease ([employer]) ?  Chronic pain syndrome ? ?Assessment and Plan: ? ? ?Sickle cell crisis  ?-Dilaudid PCA increased to 0.6/10/4.2 due to high levels of pain ?-Continue IV fluid ?-Will require transfusion 1U pending ?  ?Chronic anemia ?-Hemoglobin has dropped and 1U transfusion ordered ?-Monitor amlabs ?  ?AKI on CKD stage IV-improving ?-Continue IV fluid ?-Monitor strict I's and O's-no oliguria noted ?-Avoid nephrotoxic agents ?-Repeat labs in a.m. ?  ?Hyperkalemia-resolved ?-Related to CKD ?-Lokelma given in ED with no pertinent EKG changes ?-Continue Veltassa ?-DC tele ?  ?Leukocytosis ?-Appears chronic ?-Increased and likely stress-induced ?-Continue to monitor with no signs of infection noted ?-Chest x-ray with no acute findings ?  ?Chronic pain syndrome ?-Continue home pain medications ?-Dilaudid PCA as noted above ? ?  ?DVT prophylaxis: Heparin ?Code Status: Full ?Family Communication: None at  bedside ?Disposition Plan:  ?Status is: Inpatient ?Remains inpatient appropriate because: PCA for pain control and need for transfusion. ? ?Consultants:  ?None ? ?Procedures:  ?None ? ?Antimicrobials:  ?None ? ? ?Subjective: ?Patient seen and evaluated today with ongoing pain to his lower extremities that appear to have worsened. He is urinating and his urine color is lighter. He denies and further nausea or vomiting and is tolerating diet.  ? ?Objective: ?Vitals:  ? 06/24/21 0602 06/24/21 0707 06/24/21 1020 06/24/21 1358  ?BP: 104/69  103/64 103/65  ?Pulse: 86  90 78  ?Resp: 12  20 20   ?Temp: 98.2 ?F (36.8 ?C)  98.5 ?F (36.9 ?C) 98.2 ?F (36.8 ?C)  ?TempSrc: Oral  Oral Oral  ?SpO2: 92% 95% 96% 97%  ?Weight:      ?Height:      ? ? ?Intake/Output Summary (Last 24 hours) at 06/24/2021 1523 ?Last data filed at 06/24/2021 0707 ?Gross per 24 hour  ?Intake 185.39 ml  ?Output 1650 ml  ?Net -1464.61 ml  ? ?Filed Weights  ? 06/23/21 1120  ?Weight: 56.7 kg  ? ? ?Examination: ? ?General exam: Appears calm and comfortable  ?Respiratory system: Clear to auscultation. Respiratory effort normal. ?Cardiovascular system: S1 & S2 heard, RRR.  ?Gastrointestinal system: Abdomen is soft ?Central nervous system: Alert and awake ?Extremities: No edema ?Skin: No significant lesions noted ?Psychiatry: Flat affect. ? ? ? ?Data Reviewed: I have personally reviewed following labs and imaging studies ? ?CBC: ?Recent Labs  ?Lab 06/23/21 ?1159 06/24/21 ?5784  ?WBC 17.9* 21.8*  ?NEUTROABS 8.1*  --   ?HGB 8.7* 6.9*  ?HCT 24.2*  20.2*  ?MCV 109.0* 111.6*  ?PLT 267 212  ? ?Basic Metabolic Panel: ?Recent Labs  ?Lab 06/23/21 ?1159 06/24/21 ?8850  ?NA 137 134*  ?K 5.6* 4.7  ?CL 106 106  ?CO2 20* 20*  ?GLUCOSE 110* 86  ?BUN 57* 45*  ?CREATININE 4.28* 3.65*  ?CALCIUM 8.7* 8.5*  ? ?GFR: ?Estimated Creatinine Clearance: 22 mL/min (A) (by C-G formula based on SCr of 3.65 mg/dL (H)). ?Liver Function Tests: ?Recent Labs  ?Lab 06/23/21 ?1159  ?AST 83*  ?ALT 59*   ?ALKPHOS 245*  ?BILITOT 1.5*  ?PROT 7.6  ?ALBUMIN 3.7  ? ?No results for input(s): LIPASE, AMYLASE in the last 168 hours. ?No results for input(s): AMMONIA in the last 168 hours. ?Coagulation Profile: ?No results for input(s): INR, PROTIME in the last 168 hours. ?Cardiac Enzymes: ?No results for input(s): CKTOTAL, CKMB, CKMBINDEX, TROPONINI in the last 168 hours. ?BNP (last 3 results) ?No results for input(s): PROBNP in the last 8760 hours. ?HbA1C: ?No results for input(s): HGBA1C in the last 72 hours. ?CBG: ?No results for input(s): GLUCAP in the last 168 hours. ?Lipid Profile: ?No results for input(s): CHOL, HDL, LDLCALC, TRIG, CHOLHDL, LDLDIRECT in the last 72 hours. ?Thyroid Function Tests: ?No results for input(s): TSH, T4TOTAL, FREET4, T3FREE, THYROIDAB in the last 72 hours. ?Anemia Panel: ?Recent Labs  ?  06/23/21 ?1159  ?RETICCTPCT 8.9*  ? ?Sepsis Labs: ?No results for input(s): PROCALCITON, LATICACIDVEN in the last 168 hours. ? ?No results found for this or any previous visit (from the past 240 hour(s)).  ? ? ? ? ? ?Radiology Studies: ?DG Chest 2 View ? ?Result Date: 06/23/2021 ?CLINICAL DATA:  Sickle cell disease. Complains of sickle cell pain and bilateral arms and shortness of breath. EXAM: CHEST - 2 VIEW COMPARISON:  02/06/2021 FINDINGS: Stable cardiomediastinal contours. No signs of pleural effusion or edema. Chronic scarring is identified within the retrocardiac left lung base. No superimposed airspace consolidation. Bony stigmata of sickle cell disease is again noted with diffuse osseous sclerosis and avascular necrosis within both humeral heads. IMPRESSION: No acute cardiopulmonary abnormalities. Chronic changes of sickle cell disease as described above. Electronically Signed   By: Kerby Moors M.D.   On: 06/23/2021 12:24   ? ? ? ? ? ?Scheduled Meds: ? sodium chloride   Intravenous Once  ? amitriptyline  50 mg Oral QHS  ? calcitRIOL  0.25 mcg Oral Daily  ? Deferiprone (Twice Daily)  1,000 mg Oral  BID  ? folic acid  1 mg Oral Daily  ? heparin  5,000 Units Subcutaneous Q8H  ? HYDROmorphone   Intravenous Q4H  ? patiromer  1 packet Oral Daily  ? senna-docusate  1 tablet Oral BID  ? sodium bicarbonate  1,950 mg Oral TID  ? voxelotor  1,500 mg Oral Daily  ? ?Continuous Infusions: ? sodium chloride 100 mL/hr at 06/23/21 1243  ? ? ? LOS: 1 day  ? ? ?Time spent: 35 minutes ? ? ? ?[place], DO ?[facility] ? ?If 7PM-7AM, please contact night-coverage ?[email or web address] ?06/24/2021, 3:23 PM  ? ?

## 2021-06-24 NOTE — Progress Notes (Signed)
Pt refusing Heparin shots ordered prophylactic.  Notified Dr and educated pt.   ?

## 2021-06-25 DIAGNOSIS — D5700 Hb-SS disease with crisis, unspecified: Secondary | ICD-10-CM | POA: Diagnosis not present

## 2021-06-25 DIAGNOSIS — N184 Chronic kidney disease, stage 4 (severe): Secondary | ICD-10-CM | POA: Diagnosis not present

## 2021-06-25 DIAGNOSIS — G894 Chronic pain syndrome: Secondary | ICD-10-CM | POA: Diagnosis not present

## 2021-06-25 DIAGNOSIS — D72829 Elevated white blood cell count, unspecified: Secondary | ICD-10-CM

## 2021-06-25 DIAGNOSIS — E875 Hyperkalemia: Secondary | ICD-10-CM

## 2021-06-25 DIAGNOSIS — N179 Acute kidney failure, unspecified: Secondary | ICD-10-CM | POA: Diagnosis not present

## 2021-06-25 LAB — CBC
HCT: 24.8 % — ABNORMAL LOW (ref 39.0–52.0)
Hemoglobin: 8.5 g/dL — ABNORMAL LOW (ref 13.0–17.0)
MCH: 36 pg — ABNORMAL HIGH (ref 26.0–34.0)
MCHC: 34.3 g/dL (ref 30.0–36.0)
MCV: 105.1 fL — ABNORMAL HIGH (ref 80.0–100.0)
Platelets: 225 10*3/uL (ref 150–400)
RBC: 2.36 MIL/uL — ABNORMAL LOW (ref 4.22–5.81)
RDW: 23.3 % — ABNORMAL HIGH (ref 11.5–15.5)
WBC: 18.5 10*3/uL — ABNORMAL HIGH (ref 4.0–10.5)
nRBC: 0.2 % (ref 0.0–0.2)

## 2021-06-25 LAB — BASIC METABOLIC PANEL
Anion gap: 8 (ref 5–15)
BUN: 37 mg/dL — ABNORMAL HIGH (ref 6–20)
CO2: 21 mmol/L — ABNORMAL LOW (ref 22–32)
Calcium: 8.6 mg/dL — ABNORMAL LOW (ref 8.9–10.3)
Chloride: 107 mmol/L (ref 98–111)
Creatinine, Ser: 2.75 mg/dL — ABNORMAL HIGH (ref 0.61–1.24)
GFR, Estimated: 29 mL/min — ABNORMAL LOW (ref >60)
Glucose, Bld: 92 mg/dL (ref 70–99)
Potassium: 4.4 mmol/L (ref 3.5–5.1)
Sodium: 136 mmol/L (ref 135–145)

## 2021-06-25 NOTE — Discharge Summary (Signed)
Physician Discharge Summary  ?[name] A Baranek Jr. IRW:[id number] DOB: 10-23-1980 DOA: 06/23/2021 ? ?PCP: [name], FNP ? ?Admit date: 06/23/2021 ? ?Discharge date: 06/25/2021 ? ?Discharge Diagnoses:  ?Active Problems: ?  CKD (chronic kidney disease), stage IV ([place]) ?  Hyperkalemia ?  Leukocytosis ?  Sickle cell crisis ([place]) ?  Acute kidney injury superimposed on chronic kidney disease ([place]) ?  Chronic pain syndrome ? ? ?Discharge Condition: Stable ? ?Disposition:  ? Follow-up Information   ? ? [name], FNP. Schedule an appointment as soon as possible for a visit in 1 week(s).   ?Specialty: Family Medicine ?Contact information: ?[address] ?[address] ?[place] [address] ?[phone] ? ? ?  ?  ? ? Buford Dresser, MD .   ?Specialty: Cardiology ?Contact information: ?[address] ?[address] ?[place] [address] ?[phone] ? ? ?  ?  ? ?  ?  ? ?  ? ?Pt is discharged home in good condition and is to follow up with [name], FNP this week to have labs evaluated. Rohin A Fitzgibbons Jr. is instructed to increase activity slowly and balance with rest for the next few days, and use prescribed medication to complete treatment of pain ? ?Diet: Regular ?Wt Readings from Last 3 Encounters:  ?06/23/21 56.7 kg  ?04/19/21 56.8 kg  ?04/11/21 50.7 kg  ? ? ?History of present illness:  ?[name] [name]. is a 41 y.o. male with medical history significant for sickle cell disease, CKD stage IV, chronic anemia related to sickle cell, chronic pain syndrome with opiate dependence and tolerance, history of polysubstance abuse, history of tobacco abuse, and history of migraine headaches who presented to the ED with pain in his low back and legs as well as his arms accompanied with nausea and vomiting that started approximately 1 day prior.  He has not been able to keep down his home medications and states that this pain and presentation is typical for her sickle cell crisis.  He does not feel  short of breath nor does he have any chest pain, fevers, or chills. ?  ?ED Course: Patient has been given multiple rounds of Dilaudid in the ED and continues to have pain level 6/10.  He states that his nausea has improved some at this time.  Chest x-ray with no acute findings.  EKG with normal sinus rhythm and no changes to reflect hyperkalemia.  Creatinine is 4.28 and potassium is 5.6.  Hemoglobin is near baseline around 8 and he is noted to have some leukocytosis. ? ?Hospital Course:  ?Patient was admitted for sickle cell pain crisis with intractable nausea and vomiting, AKI on CKD stage IV and hyperkalemia.  He was managed appropriately with IVF, IV Dilaudid via PCA and other adjunct therapies per sickle cell pain management protocols.  Toradol is contraindicated due to CKD.  Patient's hemoglobin dropped below baseline for which she was transfused with 1 unit of packed red blood cell with significant improvement in his symptoms and her hemoglobin concentration.  His chronic leukocytosis was observed without antibiotics during this admission, trending down at the time of discharge.  Serum creatinine also trended down in response to gentle hydration and was at baseline at the time of discharge.  Serum potassium also returned to normal.  All nephrotoxins were avoided.  Patient did well on above regimen and requested to be discharged home today because according to patient he is at his baseline and his pain is manageable at home at  this time. Patient was therefore discharged home today in a hemodynamically stable condition.  ? ?Aristides will follow-up with PCP within 1 week of this discharge. Tamar was counseled extensively about nonpharmacologic means of pain management, patient verbalized understanding and was appreciative of  the care received during this admission.  ? ?We discussed the need for good hydration, monitoring of hydration status, avoidance of heat, cold, stress, and infection triggers. We discussed  the need to be adherent with taking Hydrea and other home medications. Patient was reminded of the need to seek medical attention immediately if any symptom of bleeding, anemia, or infection occurs. ? ?Discharge Exam: ?Vitals:  ? 06/25/21 0929 06/25/21 1051  ?BP:  106/64  ?Pulse:  80  ?Resp: 12 15  ?Temp:  98.3 ?F (36.8 ?C)  ?SpO2: 100% 94%  ? ?Vitals:  ? 06/25/21 0316 06/25/21 0316 06/25/21 0929 06/25/21 1051  ?BP: 105/72 105/72  106/64  ?Pulse: 77 77  80  ?Resp: 17 17 12 15   ?Temp: 98.7 ?F (37.1 ?C) 98.7 ?F (37.1 ?C)  98.3 ?F (36.8 ?C)  ?TempSrc: Oral Oral  Oral  ?SpO2: 99% 99% 100% 94%  ?Weight:      ?Height:      ? ? ?General appearance : Awake, alert, not in any distress. Speech Clear. Not toxic looking ?HEENT: Atraumatic and Normocephalic, pupils equally reactive to light and accomodation ?Neck: Supple, no JVD. No cervical lymphadenopathy.  ?Chest: Good air entry bilaterally, no added sounds  ?CVS: S1 S2 regular, no murmurs.  ?Abdomen: Bowel sounds present, Non tender and not distended with no gaurding, rigidity or rebound. ?Extremities: B/L Lower Ext shows no edema, both legs are warm to touch ?Neurology: Awake alert, and oriented X 3, CN II-XII intact, Non focal ?Skin: No Rash ? ?Discharge Instructions ? ?Discharge Instructions   ? ? Diet - low sodium heart healthy   Complete by: As directed ?  ? Increase activity slowly   Complete by: As directed ?  ? ?  ? ?Allergies as of 06/25/2021   ? ?   Reactions  ? Nsaids Other (See Comments)  ? Kidney disease  ? Morphine And Related Other (See Comments)  ? "Real bad headaches"   ? ?  ? ?  ?Medication List  ?  ? ?TAKE these medications   ? ?acetaminophen 650 MG CR tablet ?Commonly known as: TYLENOL ?Take 650 mg by mouth every 8 (eight) hours as needed for pain. ?  ?amitriptyline 25 MG tablet ?Commonly known as: ELAVIL ?Take 50 mg by mouth at bedtime. ?  ?calcitRIOL 0.25 MCG capsule ?Commonly known as: ROCALTROL ?Take 0.25 mcg by mouth daily. ?  ?Ferriprox Twice-A-Day  1000 MG Tabs ?Generic drug: Deferiprone (Twice Daily) ?Take 1,000 mg by mouth 2 (two) times daily. ?  ?folic acid 1 MG tablet ?Commonly known as: FOLVITE ?TAKE 1 TABLET BY MOUTH EVERY DAY ?  ?hydroxyurea 500 MG capsule ?Commonly known as: HYDREA ?Take 500 mg by mouth daily. ?  ?Oxbryta 500 MG Tabs tablet ?Generic drug: voxelotor ?Take 1,500 mg by mouth daily. ?  ?OxyCONTIN 15 mg 12 hr tablet ?Generic drug: oxyCODONE ?Take 1 tablet (15 mg total) by mouth every 12 (twelve) hours. ?What changed:  ?when to take this ?reasons to take this ?  ?Oxycodone HCl 10 MG Tabs ?Take 1 tablet (10 mg total) by mouth every 6 (six) hours as needed. ?What changed: Another medication with the same name was changed. Make sure you understand how and when to take each. ?  ?  sodium bicarbonate 650 MG tablet ?Take 1,950 mg by mouth 3 (three) times daily. ?  ?Veltassa 8.4 g packet ?Generic drug: patiromer ?Take 1 packet by mouth daily. ?  ? ?  ? ? ?The results of significant diagnostics from this hospitalization (including imaging, microbiology, ancillary and laboratory) are listed below for reference.   ? ?Significant Diagnostic Studies: ?DG Chest 2 View ? ?Result Date: 06/23/2021 ?CLINICAL DATA:  Sickle cell disease. Complains of sickle cell pain and bilateral arms and shortness of breath. EXAM: CHEST - 2 VIEW COMPARISON:  02/06/2021 FINDINGS: Stable cardiomediastinal contours. No signs of pleural effusion or edema. Chronic scarring is identified within the retrocardiac left lung base. No superimposed airspace consolidation. Bony stigmata of sickle cell disease is again noted with diffuse osseous sclerosis and avascular necrosis within both humeral heads. IMPRESSION: No acute cardiopulmonary abnormalities. Chronic changes of sickle cell disease as described above. Electronically Signed   By: Kerby Moors M.D.   On: 06/23/2021 12:24   ? ?Microbiology: ?No results found for this or any previous visit (from the past 240 hour(s)).   ? ?Labs: ?Basic Metabolic Panel: ?Recent Labs  ?Lab 06/23/21 ?1159 06/24/21 ?7416 06/25/21 ?3845  ?NA 137 134* 136  ?K 5.6* 4.7 4.4  ?CL 106 106 107  ?CO2 20* 20* 21*  ?GLUCOSE 110* 86 92  ?BUN 57* 45* 37*  ?CREA

## 2021-06-27 LAB — TYPE AND SCREEN
ABO/RH(D): O POS
Antibody Screen: POSITIVE
DAT, IgG: POSITIVE
Unit division: 0

## 2021-06-27 LAB — BPAM RBC
Blood Product Expiration Date: 202306222359
ISSUE DATE / TIME: 202305130025
Unit Type and Rh: 5100

## 2021-06-28 ENCOUNTER — Other Ambulatory Visit (HOSPITAL_COMMUNITY): Payer: Self-pay

## 2021-06-28 ENCOUNTER — Other Ambulatory Visit: Payer: Self-pay | Admitting: Family Medicine

## 2021-06-28 ENCOUNTER — Telehealth: Payer: Self-pay

## 2021-06-28 DIAGNOSIS — G894 Chronic pain syndrome: Secondary | ICD-10-CM

## 2021-06-28 DIAGNOSIS — D571 Sickle-cell disease without crisis: Secondary | ICD-10-CM

## 2021-06-28 MED ORDER — OXYCODONE HCL 10 MG PO TABS
10.0000 mg | ORAL_TABLET | Freq: Four times a day (QID) | ORAL | 0 refills | Status: DC | PRN
  Filled 2021-06-28: qty 60, 15d supply, fill #0

## 2021-06-28 NOTE — Progress Notes (Signed)
Reviewed PDMP substance reporting system prior to prescribing opiate medications. No inconsistencies noted.  Meds ordered this encounter  Medications   Oxycodone HCl 10 MG TABS    Sig: Take 1 tablet (10 mg total) by mouth every 6 (six) hours as needed.    Dispense:  60 tablet    Refill:  0    Order Specific Question:   Supervising Provider    Answer:   [name], [name] [[id number]]   [name] Altice Moore Bexley Laubach  APRN, MSN, FNP-C [facility] [address]  [place], [place] [address] [phone]  

## 2021-06-28 NOTE — Telephone Encounter (Signed)
Pt called and said that YOU told him to call so he can get the  ?Oxy Contin ?Oxycodone  ? ? To make to his appt ?

## 2021-07-01 ENCOUNTER — Other Ambulatory Visit (HOSPITAL_COMMUNITY): Payer: Self-pay

## 2021-07-17 ENCOUNTER — Other Ambulatory Visit: Payer: Self-pay

## 2021-07-17 ENCOUNTER — Emergency Department (HOSPITAL_COMMUNITY): Payer: Medicare Other

## 2021-07-17 ENCOUNTER — Inpatient Hospital Stay (HOSPITAL_COMMUNITY)
Admission: EM | Admit: 2021-07-17 | Discharge: 2021-07-20 | DRG: 812 | Disposition: A | Discharge status: Home / Self Care | Payer: Medicare Other | Attending: Internal Medicine | Admitting: Internal Medicine

## 2021-07-17 DIAGNOSIS — N184 Chronic kidney disease, stage 4 (severe): Secondary | ICD-10-CM | POA: Diagnosis present

## 2021-07-17 DIAGNOSIS — D5700 Hb-SS disease with crisis, unspecified: Principal | ICD-10-CM | POA: Diagnosis present

## 2021-07-17 DIAGNOSIS — Z885 Allergy status to narcotic agent status: Secondary | ICD-10-CM

## 2021-07-17 DIAGNOSIS — G894 Chronic pain syndrome: Secondary | ICD-10-CM

## 2021-07-17 DIAGNOSIS — Z87891 Personal history of nicotine dependence: Secondary | ICD-10-CM

## 2021-07-17 DIAGNOSIS — Z79899 Other long term (current) drug therapy: Secondary | ICD-10-CM

## 2021-07-17 DIAGNOSIS — M87872 Other osteonecrosis, left ankle: Secondary | ICD-10-CM | POA: Diagnosis present

## 2021-07-17 DIAGNOSIS — N179 Acute kidney failure, unspecified: Secondary | ICD-10-CM | POA: Diagnosis present

## 2021-07-17 DIAGNOSIS — E875 Hyperkalemia: Secondary | ICD-10-CM | POA: Diagnosis present

## 2021-07-17 DIAGNOSIS — D638 Anemia in other chronic diseases classified elsewhere: Secondary | ICD-10-CM | POA: Diagnosis present

## 2021-07-17 DIAGNOSIS — N08 Glomerular disorders in diseases classified elsewhere: Secondary | ICD-10-CM | POA: Diagnosis present

## 2021-07-17 DIAGNOSIS — D571 Sickle-cell disease without crisis: Secondary | ICD-10-CM

## 2021-07-17 DIAGNOSIS — F1910 Other psychoactive substance abuse, uncomplicated: Secondary | ICD-10-CM | POA: Diagnosis not present

## 2021-07-17 DIAGNOSIS — K219 Gastro-esophageal reflux disease without esophagitis: Secondary | ICD-10-CM | POA: Diagnosis present

## 2021-07-17 DIAGNOSIS — D72829 Elevated white blood cell count, unspecified: Secondary | ICD-10-CM | POA: Diagnosis present

## 2021-07-17 DIAGNOSIS — Z886 Allergy status to analgesic agent status: Secondary | ICD-10-CM

## 2021-07-17 LAB — CBC WITH DIFFERENTIAL/PLATELET
Abs Immature Granulocytes: 0.09 10*3/uL — ABNORMAL HIGH (ref 0.00–0.07)
Basophils Absolute: 0.1 10*3/uL (ref 0.0–0.1)
Basophils Relative: 0 %
Eosinophils Absolute: 0.3 10*3/uL (ref 0.0–0.5)
Eosinophils Relative: 1 %
HCT: 28.9 % — ABNORMAL LOW (ref 39.0–52.0)
Hemoglobin: 10.2 g/dL — ABNORMAL LOW (ref 13.0–17.0)
Immature Granulocytes: 1 %
Lymphocytes Relative: 31 %
Lymphs Abs: 5.7 10*3/uL — ABNORMAL HIGH (ref 0.7–4.0)
MCH: 37.9 pg — ABNORMAL HIGH (ref 26.0–34.0)
MCHC: 35.3 g/dL (ref 30.0–36.0)
MCV: 107.4 fL — ABNORMAL HIGH (ref 80.0–100.0)
Monocytes Absolute: 1.3 10*3/uL — ABNORMAL HIGH (ref 0.1–1.0)
Monocytes Relative: 7 %
Neutro Abs: 11.2 10*3/uL — ABNORMAL HIGH (ref 1.7–7.7)
Neutrophils Relative %: 60 %
Platelets: 242 10*3/uL (ref 150–400)
RBC: 2.69 MIL/uL — ABNORMAL LOW (ref 4.22–5.81)
RDW: 19.3 % — ABNORMAL HIGH (ref 11.5–15.5)
WBC: 18.6 10*3/uL — ABNORMAL HIGH (ref 4.0–10.5)
nRBC: 0 % (ref 0.0–0.2)

## 2021-07-17 LAB — COMPREHENSIVE METABOLIC PANEL
ALT: 40 U/L (ref 0–44)
AST: 54 U/L — ABNORMAL HIGH (ref 15–41)
Albumin: 4 g/dL (ref 3.5–5.0)
Alkaline Phosphatase: 162 U/L — ABNORMAL HIGH (ref 38–126)
Anion gap: 11 (ref 5–15)
BUN: 54 mg/dL — ABNORMAL HIGH (ref 6–20)
CO2: 15 mmol/L — ABNORMAL LOW (ref 22–32)
Calcium: 8.4 mg/dL — ABNORMAL LOW (ref 8.9–10.3)
Chloride: 116 mmol/L — ABNORMAL HIGH (ref 98–111)
Creatinine, Ser: 3.84 mg/dL — ABNORMAL HIGH (ref 0.61–1.24)
GFR, Estimated: 20 mL/min — ABNORMAL LOW (ref >60)
Glucose, Bld: 87 mg/dL (ref 70–99)
Potassium: 6.3 mmol/L (ref 3.5–5.1)
Sodium: 142 mmol/L (ref 135–145)
Total Bilirubin: 1.2 mg/dL (ref 0.3–1.2)
Total Protein: 7.9 g/dL (ref 6.5–8.1)

## 2021-07-17 LAB — URINALYSIS, ROUTINE W REFLEX MICROSCOPIC
Bacteria, UA: NONE SEEN
Bilirubin Urine: NEGATIVE
Glucose, UA: 50 mg/dL — AB
Ketones, ur: NEGATIVE mg/dL
Leukocytes,Ua: NEGATIVE
Nitrite: NEGATIVE
Protein, ur: 100 mg/dL — AB
Specific Gravity, Urine: 1.01 (ref 1.005–1.030)
pH: 8 (ref 5.0–8.0)

## 2021-07-17 LAB — RETICULOCYTES
Immature Retic Fract: 14 % (ref 2.3–15.9)
RBC.: 2.69 MIL/uL — ABNORMAL LOW (ref 4.22–5.81)
Retic Count, Absolute: 129.4 10*3/uL (ref 19.0–186.0)
Retic Ct Pct: 4.8 % — ABNORMAL HIGH (ref 0.4–3.1)

## 2021-07-17 MED ORDER — OXYCODONE HCL 5 MG PO TABS
10.0000 mg | ORAL_TABLET | Freq: Once | ORAL | Status: AC
  Administered 2021-07-17: 10 mg via ORAL
  Filled 2021-07-17: qty 2

## 2021-07-17 MED ORDER — DEXTROSE-NACL 5-0.45 % IV SOLN: INTRAVENOUS | Status: DC

## 2021-07-17 MED ORDER — OXYCODONE HCL ER 15 MG PO T12A
15.0000 mg | EXTENDED_RELEASE_TABLET | Freq: Two times a day (BID) | ORAL | Status: DC | PRN
  Administered 2021-07-18 – 2021-07-19 (×3): 15 mg via ORAL
  Filled 2021-07-17 (×2): qty 1

## 2021-07-17 MED ORDER — SODIUM BICARBONATE 650 MG PO TABS
1950.0000 mg | ORAL_TABLET | Freq: Three times a day (TID) | ORAL | Status: DC
  Administered 2021-07-18 – 2021-07-20 (×8): 1950 mg via ORAL
  Filled 2021-07-17 (×9): qty 3

## 2021-07-17 MED ORDER — ONDANSETRON HCL 4 MG/2ML IJ SOLN
4.0000 mg | INTRAMUSCULAR | Status: DC | PRN
  Administered 2021-07-19: 4 mg via INTRAVENOUS
  Filled 2021-07-17: qty 2

## 2021-07-17 MED ORDER — SODIUM ZIRCONIUM CYCLOSILICATE 10 G PO PACK
10.0000 g | PACK | Freq: Three times a day (TID) | ORAL | Status: DC
  Administered 2021-07-18 (×2): 10 g via ORAL
  Filled 2021-07-17 (×2): qty 1

## 2021-07-17 MED ORDER — HYDROMORPHONE HCL 2 MG/ML IJ SOLN
2.0000 mg | INTRAMUSCULAR | Status: AC
  Administered 2021-07-18: 2 mg via INTRAVENOUS
  Filled 2021-07-17: qty 1

## 2021-07-17 MED ORDER — ENOXAPARIN SODIUM 30 MG/0.3ML IJ SOSY
30.0000 mg | PREFILLED_SYRINGE | INTRAMUSCULAR | Status: DC
  Administered 2021-07-18 – 2021-07-20 (×3): 30 mg via SUBCUTANEOUS
  Filled 2021-07-17 (×3): qty 0.3

## 2021-07-17 MED ORDER — HYDROMORPHONE HCL 2 MG/ML IJ SOLN
2.0000 mg | INTRAMUSCULAR | Status: AC
  Administered 2021-07-17: 2 mg via INTRAVENOUS
  Filled 2021-07-17: qty 1

## 2021-07-17 MED ORDER — SENNOSIDES-DOCUSATE SODIUM 8.6-50 MG PO TABS
1.0000 | ORAL_TABLET | Freq: Two times a day (BID) | ORAL | Status: DC
  Filled 2021-07-17 (×5): qty 1

## 2021-07-17 MED ORDER — FAMOTIDINE 20 MG PO TABS
20.0000 mg | ORAL_TABLET | Freq: Every day | ORAL | Status: DC
  Administered 2021-07-19 – 2021-07-20 (×2): 20 mg via ORAL
  Filled 2021-07-17 (×3): qty 1

## 2021-07-17 MED ORDER — FAMOTIDINE IN NACL 20-0.9 MG/50ML-% IV SOLN: 20.0000 mg | Freq: Every day | INTRAVENOUS | Status: DC

## 2021-07-17 MED ORDER — VOXELOTOR 500 MG PO TABS
1500.0000 mg | ORAL_TABLET | Freq: Every day | ORAL | Status: DC
  Administered 2021-07-18: 1500 mg via ORAL

## 2021-07-17 MED ORDER — ONDANSETRON HCL 4 MG PO TABS: 4.0000 mg | ORAL_TABLET | ORAL | Status: DC | PRN

## 2021-07-17 MED ORDER — CALCITRIOL 0.25 MCG PO CAPS
0.2500 ug | ORAL_CAPSULE | Freq: Every day | ORAL | Status: DC
  Administered 2021-07-18 – 2021-07-20 (×3): 0.25 ug via ORAL
  Filled 2021-07-17 (×3): qty 1

## 2021-07-17 MED ORDER — FOLIC ACID 1 MG PO TABS
1.0000 mg | ORAL_TABLET | Freq: Every day | ORAL | Status: DC
  Administered 2021-07-18 – 2021-07-20 (×3): 1 mg via ORAL
  Filled 2021-07-17 (×3): qty 1

## 2021-07-17 MED ORDER — ADULT MULTIVITAMIN W/MINERALS CH
1.0000 | ORAL_TABLET | Freq: Every day | ORAL | Status: DC
  Administered 2021-07-18 – 2021-07-20 (×3): 1 via ORAL
  Filled 2021-07-17 (×3): qty 1

## 2021-07-17 MED ORDER — POLYETHYLENE GLYCOL 3350 17 G PO PACK: 17.0000 g | PACK | Freq: Every day | ORAL | Status: DC | PRN

## 2021-07-17 MED ORDER — HYDROXYUREA 500 MG PO CAPS
500.0000 mg | ORAL_CAPSULE | Freq: Every day | ORAL | Status: DC
  Administered 2021-07-18 – 2021-07-20 (×3): 500 mg via ORAL
  Filled 2021-07-17 (×3): qty 1

## 2021-07-17 MED ORDER — ONDANSETRON HCL 4 MG/2ML IJ SOLN
4.0000 mg | INTRAMUSCULAR | Status: DC | PRN
  Administered 2021-07-17: 4 mg via INTRAVENOUS
  Filled 2021-07-17: qty 2

## 2021-07-17 MED ORDER — DEFERIPRONE (TWICE DAILY) 1000 MG PO TABS
1000.0000 mg | ORAL_TABLET | Freq: Two times a day (BID) | ORAL | Status: DC
  Administered 2021-07-18 – 2021-07-20 (×4): 1000 mg via ORAL

## 2021-07-17 NOTE — H&P (Signed)
History and Physical   Izaah A [employer]. KGY:[id number] DOB: 1980-11-29 DOA: 07/17/2021  PCP: [name], FNP   Patient coming from: Home  Chief Complaint: Pain in multiple sites  HPI: [name] [name]. is a 41 y.o. male with medical history significant of sickle cell disease, CKD 4, migraines, substance use, GERD, osteonecrosis who is presenting with pain in multiple locations including his left flank, leg, feet similar to previous sickle cell pain however he has pain that he reported is somewhat different in his left ankle and foot.  He states he ran out of his oxycodone 3 days ago and has tried Tylenol since the pain started 2 days ago without help.  He denies fevers, chills, chest pain, shortness of breath, constipation, diarrhea, nausea, vomiting.***  ED Course: Vital signs in the ED stable.  Lab work-up included CMP with potassium 6.3, chloride 116, bicarb 15, BUN 54, creatinine near baseline at 3.84, improved AST of 54 and improved alk phos of 162.  CBC showed stable hemoglobin 10.2 and stable leukocytosis of 18.6.  Reticulocyte count elevated at 4.8.  Patient was typed and screened in the ED.  Urinalysis showed hemoglobin and protein.  X-ray of the foot and ankle on the left showed changes secondary to bone infarctions in the past similar to MRI seen in 2021 and changes of avascular necrosis.  Patient received Dilaudid, oxycodone, Zofran, Lokelma, D5 half-normal in the ED.  Nephrologist consulted recommended IV fluids as ordered, Lokelma until potassium is less than 5, renal diet.  Review of Systems: As per HPI otherwise all other systems reviewed and are negative.  Past Medical History:  Diagnosis Date   Abscess of right iliac fossa 09/24/2018   required Perc Drain 09/24/2018   Arachnoid Cyst of brain bilaterally    "2 really small ones in the back of my head; inside; saw them w/MRI" (09/25/2012)   Bacterial pneumonia ~ 2012   "caught it here in the hospital"  (09/25/2012)   Chronic kidney disease    "from my sickle cell" (09/25/2012)   CKD (chronic kidney disease), stage II    Colitis 04/19/2017   CT scan abd/ pelvis   GERD (gastroesophageal reflux disease)    "after I eat alot of spicey foods" (09/25/2012)   Gynecomastia, male 07/10/2012   History of blood transfusion    "always related to sickle cell crisis" (09/25/2012)   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   Noted in noted from Hematology notes at [facility]    "take RX qd to prevent them" (09/25/2012)   Opioid dependence with withdrawal ([place]) 08/30/2012   Renal insufficiency    Sickle cell anemia (HCC)    Sickle cell crisis ([place]) 09/25/2012   Sickle cell nephropathy ([place]) 07/10/2012   Sinus tachycardia    Tachycardia with heart rate 121-140 beats per minute with ambulation 08/04/2016    Past Surgical History:  Procedure Laterality Date   ABCESS DRAINAGE     CHOLECYSTECTOMY  ~ 2012   COLONOSCOPY N/A 04/23/2017   Procedure: COLONOSCOPY;  Surgeon: Irene Shipper, MD;  Location: [facility];  Service: Endoscopy;  Laterality: N/A;   IR FLUORO GUIDE CV LINE RIGHT  12/17/2016   IR FLUORO GUIDE CV LINE RIGHT  02/07/2021   IR RADIOLOGIST EVAL & MGMT  10/02/2018   IR RADIOLOGIST EVAL & MGMT  10/15/2018   IR REMOVAL TUN CV CATH W/O FL  12/21/2016   IR REMOVAL TUN CV CATH W/O FL  02/11/2021  IR US GUIDE VASC ACCESS RIGHT  12/17/2016   IR US GUIDE VASC ACCESS RIGHT  02/07/2021   spleenectomy      Social History  reports that he has quit smoking. His smoking use included cigarettes. He smoked an average of .1 packs per day. He has never used smokeless tobacco. He reports current alcohol use. He reports current drug use. Drug: Marijuana.  Allergies  Allergen Reactions   Nsaids Other (See Comments)    Kidney disease   Morphine And Related Other (See Comments)    "Real bad headaches"     Family History  Problem Relation Age of Onset   Breast cancer Mother   Reviewed  on admission  Prior to Admission medications   Medication Sig Start Date End Date Taking? Authorizing Provider  acetaminophen (TYLENOL) 650 MG CR tablet Take 650 mg by mouth every 8 (eight) hours as needed for pain.   Yes [provider]  amitriptyline (ELAVIL) 25 MG tablet Take 50 mg by mouth at bedtime. 03/08/21  Yes [provider]  calcitRIOL (ROCALTROL) 0.25 MCG capsule Take 0.25 mcg by mouth daily. 11/16/20  Yes [provider]  Deferiprone, Twice Daily, (FERRIPROX TWICE-A-DAY) 1000 MG TABS Take 1,000 mg by mouth 2 (two) times daily.   Yes [provider]  folic acid (FOLVITE) 1 MG tablet TAKE 1 TABLET BY MOUTH EVERY DAY Patient taking differently: Take 1 mg by mouth daily. 03/22/20  Yes [name], FNP  hydroxyurea (HYDREA) 500 MG capsule Take 500 mg by mouth daily. 05/07/20  Yes [provider]  OXBRYTA 500 MG TABS tablet Take 1,500 mg by mouth daily. 03/26/20  Yes [provider]  oxyCODONE (OXYCONTIN) 15 mg 12 hr tablet Take 1 tablet (15 mg total) by mouth every 12 (twelve) hours. Patient taking differently: Take 15 mg by mouth every 12 (twelve) hours as needed (pain). 06/08/21  Yes [name], FNP  Oxycodone HCl 10 MG TABS Take 1 tablet (10 mg total) by mouth every 6 (six) hours as needed. Patient taking differently: Take 10 mg by mouth every 6 (six) hours as needed (PAIN). 06/28/21  Yes [name], FNP  sodium bicarbonate 650 MG tablet Take 1,950 mg by mouth 3 (three) times daily. 11/07/18  Yes [provider]  VELTASSA 8.4 g packet Take 1 packet by mouth daily. 05/23/21  Yes [provider]    Physical Exam: Vitals:   07/17/21 2145 07/17/21 2200 07/17/21 2215 07/17/21 2230  BP: 123/84 (!) 121/91 116/83 (!) 124/91  Pulse:    87  Resp: '13 12 12 13  ' Temp:      TempSrc:      SpO2:    100%  Weight:      Height:        Physical Exam Constitutional:      General: He is not in acute distress.     Appearance: Normal appearance.  HENT:     Head: Normocephalic and atraumatic.     Mouth/Throat:     Mouth: Mucous membranes are moist.     Pharynx: Oropharynx is clear.  Eyes:     Extraocular Movements: Extraocular movements intact.     Pupils: Pupils are equal, round, and reactive to light.  Cardiovascular:     Rate and Rhythm: Normal rate and regular rhythm.     Pulses: Normal pulses.     Heart sounds: Normal heart sounds.  Pulmonary:     Effort: Pulmonary effort is normal. No  respiratory distress.     Breath sounds: Normal breath sounds.  Abdominal:     General: Bowel sounds are normal. There is no distension.     Palpations: Abdomen is soft.     Tenderness: There is no abdominal tenderness.  Musculoskeletal:        General: No swelling or deformity.  Skin:    General: Skin is warm and dry.  Neurological:     General: No focal deficit present.     Mental Status: Mental status is at baseline.  ***  Labs on Admission: I have personally reviewed following labs and imaging studies  CBC: Recent Labs  Lab 07/17/21 2026  WBC 18.6*  NEUTROABS 11.2*  HGB 10.2*  HCT 28.9*  MCV 107.4*  PLT 416    Basic Metabolic Panel: Recent Labs  Lab 07/17/21 2026  NA 142  K 6.3*  CL 116*  CO2 15*  GLUCOSE 87  BUN 54*  CREATININE 3.84*  CALCIUM 8.4*    GFR: Estimated Creatinine Clearance: 20.9 mL/min (A) (by C-G formula based on SCr of 3.84 mg/dL (H)).  Liver Function Tests: Recent Labs  Lab 07/17/21 2026  AST 54*  ALT 40  ALKPHOS 162*  BILITOT 1.2  PROT 7.9  ALBUMIN 4.0    Urine analysis:    Component Value Date/Time   COLORURINE STRAW (A) 07/17/2021 2203   APPEARANCEUR CLEAR 07/17/2021 2203   LABSPEC 1.010 07/17/2021 2203   PHURINE 8.0 07/17/2021 2203   GLUCOSEU 50 (A) 07/17/2021 2203   HGBUR SMALL (A) 07/17/2021 2203   HGBUR small 03/10/2010 1445   BILIRUBINUR NEGATIVE 07/17/2021 2203   BILIRUBINUR negative 04/19/2021 1217   BILIRUBINUR neg 09/30/2020  1117   KETONESUR NEGATIVE 07/17/2021 2203   PROTEINUR 100 (A) 07/17/2021 2203   UROBILINOGEN 0.2 04/19/2021 1217   UROBILINOGEN 0.2 05/11/2017 1335   NITRITE NEGATIVE 07/17/2021 2203   LEUKOCYTESUR NEGATIVE 07/17/2021 2203    Radiological Exams on Admission: DG Ankle Complete Left  Result Date: 07/17/2021 CLINICAL DATA:  foot/ankle swelling, sickle cell EXAM: LEFT ANKLE COMPLETE - 3+ VIEW; LEFT FOOT - COMPLETE 3+ VIEW COMPARISON:  December 11, 2015. FINDINGS: Osteopenia. There is serpiginous sclerosis throughout the bones of the foot, most pronounced in the distal tibia and head of the first metatarsal. Severe joint space narrowing of the first MTP. Likely pes planus. No acute fracture or dislocation. Smooth cortical thickening of the posterior aspect of the tibia, likely due to chronic reactive stress changes, mildly increased in comparison to more remote prior. IMPRESSION: 1. No acute fracture or dislocation. 2. Multifocal serpiginous sclerosis throughout the ankle and foot consistent with sequela of multiple bone infarctions/sickle cell disease. 3. Progressive joint space narrowing of the first MTP, likely due to avascular necrosis and metatarsal head collapse. 4. Mild increase in smooth cortical thickening along the posterior aspect of the tibia since 2017. This likely is due to chronic repetitive stress changes. If further imaging is desired, recommend dedicated MRI. Electronically Signed   By: Valentino Saxon M.D.   On: 07/17/2021 21:01   DG Foot Complete Left  Result Date: 07/17/2021 CLINICAL DATA:  foot/ankle swelling, sickle cell EXAM: LEFT ANKLE COMPLETE - 3+ VIEW; LEFT FOOT - COMPLETE 3+ VIEW COMPARISON:  December 11, 2015. FINDINGS: Osteopenia. There is serpiginous sclerosis throughout the bones of the foot, most pronounced in the distal tibia and head of the first metatarsal. Severe joint space narrowing of the first MTP. Likely pes planus. No acute fracture or dislocation. Smooth  cortical thickening of the posterior aspect of the tibia, likely due to chronic reactive stress changes, mildly increased in comparison to more remote prior. IMPRESSION: 1. No acute fracture or dislocation. 2. Multifocal serpiginous sclerosis throughout the ankle and foot consistent with sequela of multiple bone infarctions/sickle cell disease. 3. Progressive joint space narrowing of the first MTP, likely due to avascular necrosis and metatarsal head collapse. 4. Mild increase in smooth cortical thickening along the posterior aspect of the tibia since 2017. This likely is due to chronic repetitive stress changes. If further imaging is desired, recommend dedicated MRI. Electronically Signed   By: Valentino Saxon M.D.   On: 07/17/2021 21:01    EKG: Independently reviewed.  Sinus rhythm at 95 bpm.  Some nonspecific T wave flattening.  Assessment/Plan Active Problems:   CKD (chronic kidney disease), stage IV (HCC)   Hyperkalemia   Polysubstance abuse (HCC)   GERD (gastroesophageal reflux disease)   Sickle cell crisis (HCC)   Hyperkalemia CKD 4 > Patient presenting with concerns for sickle cell pain as below noted to have significant hyperkalemia to 6.3. > Creatinine is somewhat higher than most recent creatinine however does seem to be within his baseline range and is consistent with CKD 4 at 3.84. > Likely secondary to degree of intravascular hemolysis hemolysis and his CKD in the setting of developing sickle cell pain crisis as below. > Nephrology consulted in the ED and left recommendations, but are not following. -Monitor on telemetry -Continue with Lokelma 3 times daily until potassium is less than 5 -Trend renal function and electrolytes -Renal diet -Continue home calcitriol and bicarb -Continue D5 half-normal at 75 cc an hour  Sickle cell pain crisis > Presenting initially concerns of sickle cell pain > Reported to be diffusely painful with some different/increased pain in his left  foot and leg. > Imaging of the left foot and ankle show changes similar to previous imaging with bone infarction and avascular necrosis. > Patient received Dilaudid and oxycodone in the ED.  Reports that he ran out of his oxycodone several days ago. > Hemoglobin stable but reticulocyte count is elevated, could represent degree of vascular hemolysis as above. - Continue with as needed Dilaudid while in the ED, start Dilaudid PCA when on the floor - Trend CBC - Continue as needed Zofran, Pepcid - Continue folate and multivitamin - Continue home hydroxyurea - Continue home Oxbryta - Continue home deferiprone - Continue D5 half-normal as above  History of polysubstance use -Noted  DVT prophylaxis: Lovenox Code Status:   Full Family Communication:  ***  Disposition Plan:   Patient is from:  Home  Anticipated DC to:  Home  Anticipated DC date:  1 to 3 days  Anticipated DC barriers: None  Consults called:  Nephrology consulted in the ED, have left recommendations, are not following. Admission status:  Observation, telemetry  Severity of Illness: The appropriate patient status for this patient is OBSERVATION. Observation status is judged to be reasonable and necessary in order to provide the required intensity of service to ensure the patient's safety. The patient's presenting symptoms, physical exam findings, and initial radiographic and laboratory data in the context of their medical condition is felt to place them at decreased risk for further clinical deterioration. Furthermore, it is anticipated that the patient will be medically stable for discharge from the hospital within 2 midnights of admission.    Marcelyn Bruins MD [facility]  How to contact the [facility] Attending or Consulting provider 7A -  7P or covering provider during after hours [place], for this patient?   Check the care team in [facility] and look for a) attending/consulting [facility] provider listed and b) the [facility] team  listed Log into [email or web address] and use [place]'s universal password to access. If you do not have the password, please contact the hospital operator. Locate the [facility] provider you are looking for under [facility] and page to a number that you can be directly reached. If you still have difficulty reaching the provider, please page the [facility] (Director on Call) for the Hospitalists listed on amion for assistance.  07/17/2021, 11:21 PM

## 2021-07-17 NOTE — ED Provider Notes (Signed)
[place] DEPT Provider Note   CSN: [id number] Arrival date & time: 07/17/21  1747     History  Chief Complaint  Patient presents with   Sickle Cell Pain Crisis    [name] Lamp. is a 41 y.o. male.  HPI 41 year old male presents with sickle cell pain crisis.  He is having pain diffusely throughout his bones.  This started in the last 2 days.  He is also developed left ankle and foot pain and swelling which is not typical for him.  He often has pain in the legs but not the foot.  He is having a hard time walking on it.  He denies fevers, chest pain, shortness of breath.  He has run out of his oxycodone about 3 days ago.  He has been taking Tylenol with no relief.  He also feels "sluggish" and wonders if his kidney function is worse.  No vomiting or diarrhea.  Home Medications Prior to Admission medications   Medication Sig Start Date End Date Taking? Authorizing Provider  acetaminophen (TYLENOL) 650 MG CR tablet Take 650 mg by mouth every 8 (eight) hours as needed for pain.   Yes [provider]  amitriptyline (ELAVIL) 25 MG tablet Take 50 mg by mouth at bedtime. 03/08/21  Yes [provider]  calcitRIOL (ROCALTROL) 0.25 MCG capsule Take 0.25 mcg by mouth daily. 11/16/20  Yes [provider]  Deferiprone, Twice Daily, (FERRIPROX TWICE-A-DAY) 1000 MG TABS Take 1,000 mg by mouth 2 (two) times daily.   Yes [provider]  folic acid (FOLVITE) 1 MG tablet TAKE 1 TABLET BY MOUTH EVERY DAY Patient taking differently: Take 1 mg by mouth daily. 03/22/20  Yes [name], FNP  hydroxyurea (HYDREA) 500 MG capsule Take 500 mg by mouth daily. 05/07/20  Yes [provider]  OXBRYTA 500 MG TABS tablet Take 1,500 mg by mouth daily. 03/26/20  Yes [provider]  oxyCODONE (OXYCONTIN) 15 mg 12 hr tablet Take 1 tablet (15 mg total) by mouth every 12 (twelve) hours. Patient taking differently: Take 15 mg by  mouth every 12 (twelve) hours as needed (pain). 06/08/21  Yes [name], FNP  Oxycodone HCl 10 MG TABS Take 1 tablet (10 mg total) by mouth every 6 (six) hours as needed. 06/28/21  Yes [name], FNP  sodium bicarbonate 650 MG tablet Take 1,950 mg by mouth 3 (three) times daily. 11/07/18  Yes [provider]  VELTASSA 8.4 g packet Take 1 packet by mouth daily. 05/23/21  Yes [provider]      Allergies    Nsaids and Morphine and related    Review of Systems   Review of Systems  Constitutional:  Negative for fever.  Respiratory:  Negative for shortness of breath.   Cardiovascular:  Negative for chest pain.  Gastrointestinal:  Negative for diarrhea and vomiting.  Musculoskeletal:  Positive for arthralgias, joint swelling and myalgias.   Physical Exam Updated Vital Signs BP (!) 124/91   Pulse 87   Temp 98.7 F (37.1 C) (Oral)   Resp 13   Ht 5\' 3"  (1.6 m)   Wt 56.7 kg   SpO2 100%   BMI 22.14 kg/m  Physical Exam Vitals and nursing note reviewed.  Constitutional:      Appearance: He is well-developed.  HENT:     Head: Normocephalic and atraumatic.  Cardiovascular:     Rate and Rhythm: Normal rate and regular rhythm.  Pulses:          Dorsalis pedis pulses are 2+ on the right side and 2+ on the left side.     Heart sounds: Normal heart sounds.  Pulmonary:     Effort: Pulmonary effort is normal.     Breath sounds: Normal breath sounds.  Abdominal:     Palpations: Abdomen is soft.     Tenderness: There is no abdominal tenderness.  Musculoskeletal:     Left ankle: Swelling present. Tenderness present.     Left foot: Swelling (proximal) and tenderness present.     Comments: Proximal foot and ankle swelling to the left foot.  No erythema or increased warmth.  It is painful to range his ankle but it's able to fully range.  Skin:    General: Skin is warm and dry.  Neurological:     Mental Status: He is alert.    ED Results / Procedures /  Treatments   Labs (all labs ordered are listed, but only abnormal results are displayed) Labs Reviewed  CBC WITH DIFFERENTIAL/PLATELET - Abnormal; Notable for the following components:      Result Value   WBC 18.6 (*)    RBC 2.69 (*)    Hemoglobin 10.2 (*)    HCT 28.9 (*)    MCV 107.4 (*)    MCH 37.9 (*)    RDW 19.3 (*)    Neutro Abs 11.2 (*)    Lymphs Abs 5.7 (*)    Monocytes Absolute 1.3 (*)    Abs Immature Granulocytes 0.09 (*)    All other components within normal limits  RETICULOCYTES - Abnormal; Notable for the following components:   Retic Ct Pct 4.8 (*)    RBC. 2.69 (*)    All other components within normal limits  COMPREHENSIVE METABOLIC PANEL - Abnormal; Notable for the following components:   Potassium 6.3 (*)    Chloride 116 (*)    CO2 15 (*)    BUN 54 (*)    Creatinine, Ser 3.84 (*)    Calcium 8.4 (*)    AST 54 (*)    Alkaline Phosphatase 162 (*)    GFR, Estimated 20 (*)    All other components within normal limits  URINALYSIS, ROUTINE W REFLEX MICROSCOPIC - Abnormal; Notable for the following components:   Color, Urine STRAW (*)    Glucose, UA 50 (*)    Hgb urine dipstick SMALL (*)    Protein, ur 100 (*)    All other components within normal limits  TYPE AND SCREEN    EKG EKG Interpretation  Date/Time:  Sunday July 17 2021 21:38:43 EDT Ventricular Rate:  95 PR Interval:  176 QRS Duration: 85 QT Interval:  351 QTC Calculation: 442 R Axis:   57 Text Interpretation: Sinus rhythm no acute ST/T changes Confirmed by Sherwood Gambler [phone]) on 07/17/2021 9:45:43 PM  Radiology DG Ankle Complete Left  Result Date: 07/17/2021 CLINICAL DATA:  foot/ankle swelling, sickle cell EXAM: LEFT ANKLE COMPLETE - 3+ VIEW; LEFT FOOT - COMPLETE 3+ VIEW COMPARISON:  December 11, 2015. FINDINGS: Osteopenia. There is serpiginous sclerosis throughout the bones of the foot, most pronounced in the distal tibia and head of the first metatarsal. Severe joint space narrowing of the  first MTP. Likely pes planus. No acute fracture or dislocation. Smooth cortical thickening of the posterior aspect of the tibia, likely due to chronic reactive stress changes, mildly increased in comparison to more remote prior. IMPRESSION: 1. No acute fracture or dislocation.  2. Multifocal serpiginous sclerosis throughout the ankle and foot consistent with sequela of multiple bone infarctions/sickle cell disease. 3. Progressive joint space narrowing of the first MTP, likely due to avascular necrosis and metatarsal head collapse. 4. Mild increase in smooth cortical thickening along the posterior aspect of the tibia since 2017. This likely is due to chronic repetitive stress changes. If further imaging is desired, recommend dedicated MRI. Electronically Signed   By: Valentino Saxon M.D.   On: 07/17/2021 21:01   DG Foot Complete Left  Result Date: 07/17/2021 CLINICAL DATA:  foot/ankle swelling, sickle cell EXAM: LEFT ANKLE COMPLETE - 3+ VIEW; LEFT FOOT - COMPLETE 3+ VIEW COMPARISON:  December 11, 2015. FINDINGS: Osteopenia. There is serpiginous sclerosis throughout the bones of the foot, most pronounced in the distal tibia and head of the first metatarsal. Severe joint space narrowing of the first MTP. Likely pes planus. No acute fracture or dislocation. Smooth cortical thickening of the posterior aspect of the tibia, likely due to chronic reactive stress changes, mildly increased in comparison to more remote prior. IMPRESSION: 1. No acute fracture or dislocation. 2. Multifocal serpiginous sclerosis throughout the ankle and foot consistent with sequela of multiple bone infarctions/sickle cell disease. 3. Progressive joint space narrowing of the first MTP, likely due to avascular necrosis and metatarsal head collapse. 4. Mild increase in smooth cortical thickening along the posterior aspect of the tibia since 2017. This likely is due to chronic repetitive stress changes. If further imaging is desired, recommend  dedicated MRI. Electronically Signed   By: Valentino Saxon M.D.   On: 07/17/2021 21:01    Procedures Procedures    Medications Ordered in ED Medications  dextrose 5 %-0.45 % sodium chloride infusion ( Intravenous New Bag/Given 07/17/21 2127)  ondansetron (ZOFRAN) injection 4 mg (4 mg Intravenous Given 07/17/21 2134)  HYDROmorphone (DILAUDID) injection 2 mg (has no administration in time range)  sodium zirconium cyclosilicate (LOKELMA) packet 10 g (has no administration in time range)  HYDROmorphone (DILAUDID) injection 2 mg (2 mg Intravenous Given 07/17/21 2126)  HYDROmorphone (DILAUDID) injection 2 mg (2 mg Intravenous Given 07/17/21 2231)  oxyCODONE (Oxy IR/ROXICODONE) immediate release tablet 10 mg (10 mg Oral Given 07/17/21 2022)    ED Course/ Medical Decision Making/ A&P                           Medical Decision Making Amount and/or Complexity of Data Reviewed External Data Reviewed: labs and notes. Labs: ordered. Radiology: ordered and independent interpretation performed. ECG/medicine tests: ordered and independent interpretation performed.  Risk Prescription drug management. Decision regarding hospitalization.   Patient's foot/ankle x-rays viewed/interpreted by myself and there is no fracture/dislocation.  Further chart review shows he has had some images of this foot and ankle since 2020 and so this probably is not as acute as patient is suggesting.  Otherwise, he was given IV Dilaudid per sickle cell protocol.  His lab work shows an acute on chronic kidney injury with a creatinine of 3.8.  He has a chronic leukocytosis and his potassium is 6.3.  He is on Veltassa which he states he has been taking for potassium.  He is not on any ARB's or ACE inhibitors.  His creatinine is elevated though similar to when [facility] evaluated labs on 5/31.  I did discuss with nephrology, Dr. Jonnie Finner.  There are no EKG changes on my interpretation.  Agrees with IV fluids at 75/hour and advises Lokelma 10  g 3  times daily until the potassium is less than 5.  Advises to also put patient on a renal diet.  Will admit to the hospitalist service.  Discussed with Dr. Trilby Drummer        Final Clinical Impression(s) / ED Diagnoses Final diagnoses:  Hyperkalemia    Rx / DC Orders ED Discharge Orders     None         Sherwood Gambler, MD 07/17/21 2311

## 2021-07-17 NOTE — ED Triage Notes (Signed)
Patient c/o sickle cell pain of the left flank, bilateral legs, and feet.

## 2021-07-18 ENCOUNTER — Encounter (HOSPITAL_COMMUNITY): Payer: Self-pay | Admitting: Internal Medicine

## 2021-07-18 DIAGNOSIS — D5700 Hb-SS disease with crisis, unspecified: Secondary | ICD-10-CM | POA: Diagnosis present

## 2021-07-18 DIAGNOSIS — Z885 Allergy status to narcotic agent status: Secondary | ICD-10-CM | POA: Diagnosis not present

## 2021-07-18 DIAGNOSIS — N179 Acute kidney failure, unspecified: Secondary | ICD-10-CM | POA: Diagnosis present

## 2021-07-18 DIAGNOSIS — D638 Anemia in other chronic diseases classified elsewhere: Secondary | ICD-10-CM | POA: Diagnosis present

## 2021-07-18 DIAGNOSIS — Z87891 Personal history of nicotine dependence: Secondary | ICD-10-CM | POA: Diagnosis not present

## 2021-07-18 DIAGNOSIS — N184 Chronic kidney disease, stage 4 (severe): Secondary | ICD-10-CM | POA: Diagnosis present

## 2021-07-18 DIAGNOSIS — N08 Glomerular disorders in diseases classified elsewhere: Secondary | ICD-10-CM | POA: Diagnosis present

## 2021-07-18 DIAGNOSIS — D72829 Elevated white blood cell count, unspecified: Secondary | ICD-10-CM | POA: Diagnosis present

## 2021-07-18 DIAGNOSIS — E875 Hyperkalemia: Secondary | ICD-10-CM | POA: Diagnosis present

## 2021-07-18 DIAGNOSIS — Z79899 Other long term (current) drug therapy: Secondary | ICD-10-CM | POA: Diagnosis not present

## 2021-07-18 DIAGNOSIS — M87872 Other osteonecrosis, left ankle: Secondary | ICD-10-CM | POA: Diagnosis present

## 2021-07-18 DIAGNOSIS — Z886 Allergy status to analgesic agent status: Secondary | ICD-10-CM | POA: Diagnosis not present

## 2021-07-18 DIAGNOSIS — K219 Gastro-esophageal reflux disease without esophagitis: Secondary | ICD-10-CM | POA: Diagnosis present

## 2021-07-18 LAB — BASIC METABOLIC PANEL
Anion gap: 9 (ref 5–15)
BUN: 48 mg/dL — ABNORMAL HIGH (ref 6–20)
CO2: 19 mmol/L — ABNORMAL LOW (ref 22–32)
Calcium: 8.3 mg/dL — ABNORMAL LOW (ref 8.9–10.3)
Chloride: 110 mmol/L (ref 98–111)
Creatinine, Ser: 3.19 mg/dL — ABNORMAL HIGH (ref 0.61–1.24)
GFR, Estimated: 25 mL/min — ABNORMAL LOW (ref >60)
Glucose, Bld: 123 mg/dL — ABNORMAL HIGH (ref 70–99)
Potassium: 4 mmol/L (ref 3.5–5.1)
Sodium: 138 mmol/L (ref 135–145)

## 2021-07-18 LAB — POTASSIUM
Potassium: 5.1 mmol/L (ref 3.5–5.1)
Potassium: 4.4 mmol/L (ref 3.5–5.1)
Potassium: 4.5 mmol/L (ref 3.5–5.1)
Potassium: 4.4 mmol/L (ref 3.5–5.1)

## 2021-07-18 LAB — RAPID URINE DRUG SCREEN, HOSP PERFORMED
Amphetamines: NOT DETECTED
Barbiturates: NOT DETECTED
Benzodiazepines: NOT DETECTED
Cocaine: NOT DETECTED
Opiates: POSITIVE — AB
Tetrahydrocannabinol: POSITIVE — AB

## 2021-07-18 LAB — CBC WITH DIFFERENTIAL/PLATELET
Abs Immature Granulocytes: 0.1 10*3/uL — ABNORMAL HIGH (ref 0.00–0.07)
Basophils Absolute: 0.1 10*3/uL (ref 0.0–0.1)
Basophils Relative: 0 %
Eosinophils Absolute: 0.4 10*3/uL (ref 0.0–0.5)
Eosinophils Relative: 2 %
HCT: 23.4 % — ABNORMAL LOW (ref 39.0–52.0)
Hemoglobin: 8.4 g/dL — ABNORMAL LOW (ref 13.0–17.0)
Immature Granulocytes: 1 %
Lymphocytes Relative: 54 %
Lymphs Abs: 11.1 10*3/uL — ABNORMAL HIGH (ref 0.7–4.0)
MCH: 38 pg — ABNORMAL HIGH (ref 26.0–34.0)
MCHC: 35.9 g/dL (ref 30.0–36.0)
MCV: 105.9 fL — ABNORMAL HIGH (ref 80.0–100.0)
Monocytes Absolute: 1.2 10*3/uL — ABNORMAL HIGH (ref 0.1–1.0)
Monocytes Relative: 6 %
Neutro Abs: 7.6 10*3/uL (ref 1.7–7.7)
Neutrophils Relative %: 37 %
Platelets: 179 10*3/uL (ref 150–400)
RBC: 2.21 MIL/uL — ABNORMAL LOW (ref 4.22–5.81)
RDW: 19.2 % — ABNORMAL HIGH (ref 11.5–15.5)
WBC: 20.4 10*3/uL — ABNORMAL HIGH (ref 4.0–10.5)
nRBC: 0 % (ref 0.0–0.2)

## 2021-07-18 LAB — LACTATE DEHYDROGENASE: LDH: 154 U/L (ref 98–192)

## 2021-07-18 MED ORDER — HYDROMORPHONE HCL 2 MG/ML IJ SOLN: 2.0000 mg | Freq: Once | INTRAMUSCULAR | Status: AC

## 2021-07-18 MED ORDER — SODIUM CHLORIDE 0.9% FLUSH: 9.0000 mL | INTRAVENOUS | Status: DC | PRN

## 2021-07-18 MED ORDER — HYDROMORPHONE 1 MG/ML IV SOLN: INTRAVENOUS | Status: DC

## 2021-07-18 MED ORDER — HYDROMORPHONE 1 MG/ML IV SOLN
INTRAVENOUS | Status: DC
  Administered 2021-07-18: 2.5 mg via INTRAVENOUS
  Administered 2021-07-18: 3 mg via INTRAVENOUS
  Administered 2021-07-18: 30 mg via INTRAVENOUS
  Administered 2021-07-18: 4 mg via INTRAVENOUS
  Administered 2021-07-19: 30 mg via INTRAVENOUS
  Administered 2021-07-19: 2.5 mg via INTRAVENOUS
  Administered 2021-07-19: 7.5 mg via INTRAVENOUS
  Administered 2021-07-19: 3 mg via INTRAVENOUS
  Administered 2021-07-19: 4.6 mg via INTRAVENOUS
  Administered 2021-07-19: 4 mg via INTRAVENOUS
  Administered 2021-07-19: 4.09 mg via INTRAVENOUS
  Administered 2021-07-20: 7.5 mg via INTRAVENOUS
  Administered 2021-07-20: 3 mg via INTRAVENOUS
  Administered 2021-07-20: 2 mg via INTRAVENOUS
  Filled 2021-07-18 (×2): qty 30

## 2021-07-18 MED ORDER — PATIROMER SORBITEX CALCIUM 8.4 G PO PACK
8.4000 g | PACK | Freq: Every day | ORAL | Status: DC
  Filled 2021-07-18 (×2): qty 1

## 2021-07-18 MED ORDER — HYDROMORPHONE HCL 2 MG/ML IJ SOLN
INTRAMUSCULAR | Status: AC
  Administered 2021-07-18: 2 mg via INTRAVENOUS
  Filled 2021-07-18: qty 1

## 2021-07-18 MED ORDER — NALOXONE HCL 0.4 MG/ML IJ SOLN: 0.4000 mg | INTRAMUSCULAR | Status: DC | PRN

## 2021-07-18 MED ORDER — DIPHENHYDRAMINE HCL 25 MG PO CAPS
25.0000 mg | ORAL_CAPSULE | ORAL | Status: DC | PRN
  Administered 2021-07-18 (×2): 25 mg via ORAL
  Filled 2021-07-18 (×3): qty 1

## 2021-07-18 MED ORDER — HYDROMORPHONE HCL 2 MG/ML IJ SOLN
2.0000 mg | INTRAMUSCULAR | Status: AC | PRN
  Administered 2021-07-18 (×3): 2 mg via INTRAVENOUS
  Filled 2021-07-18 (×3): qty 1

## 2021-07-18 MED ORDER — OXYCODONE HCL 5 MG PO TABS
10.0000 mg | ORAL_TABLET | ORAL | Status: DC | PRN
  Administered 2021-07-18 – 2021-07-20 (×7): 10 mg via ORAL
  Filled 2021-07-18 (×7): qty 2

## 2021-07-18 NOTE — Progress Notes (Signed)
Subjective: [name] [name] is a 41 year old male with a medical history significant for [name] cell disease type SS, chronic kidney disease stage IV, histories of migraine, history of polysubstance abuse, GERD, osteonecrosis, and anemia of chronic disease was admitted for [name] cell pain crisis. Today, patient is complaining of significant pain primarily to bilateral lower extremities.  Patient states that he has been unable to apply full weight to bilateral lower extremities over the past several days.  Patient has not identified any palliative or provocative factors concerning current crisis. Patient denies any headache, chest pain, dizziness, urinary symptoms, nausea, vomiting, or diarrhea  Objective:  Vital signs in last 24 hours:  Vitals:   07/18/21 0700 07/18/21 0800 07/18/21 1000 07/18/21 1100  BP: 109/81 100/82 122/88 113/78  Pulse: 93 90 96 91  Resp: 13 14 10 10   Temp:      TempSrc:      SpO2: 100% 100% 94% 94%  Weight:      Height:        Intake/Output from previous day:   Intake/Output Summary (Last 24 hours) at 07/18/2021 1112 Last data filed at 07/18/2021 1109 Gross per 24 hour  Intake 1345.05 ml  Output --  Net 1345.05 ml    Physical Exam: General: Alert, awake, oriented x3, in no acute distress.  HEENT: [place]/AT PEERL, EOMI Neck: Trachea midline,  no masses, no thyromegal,y no JVD, no carotid bruit OROPHARYNX:  Moist, No exudate/ erythema/lesions.  Heart: Regular rate and rhythm, without murmurs, rubs, gallops, PMI non-displaced, no heaves or thrills on palpation.  Lungs: Clear to auscultation, no wheezing or rhonchi noted. No increased vocal fremitus resonant to percussion  Abdomen: Soft, nontender, nondistended, positive bowel sounds, no masses no hepatosplenomegaly noted..  Neuro: No focal neurological deficits noted cranial nerves II through XII grossly intact. DTRs 2+ bilaterally upper and lower extremities. Strength 5 out of 5 in bilateral upper and lower  extremities. Musculoskeletal: No warm swelling or erythema around joints, no spinal tenderness noted. Psychiatric: Patient alert and oriented x3, good insight and cognition, good recent to remote recall. Lymph node survey: No cervical axillary or inguinal lymphadenopathy noted.  Lab Results:  Basic Metabolic Panel:    Component Value Date/Time   NA 142 07/17/2021 2026   NA 139 04/19/2021 1133   K 4.5 07/18/2021 1005   CL 116 (H) 07/17/2021 2026   CO2 15 (L) 07/17/2021 2026   BUN 54 (H) 07/17/2021 2026   BUN 36 (H) 04/19/2021 1133   CREATININE 3.84 (H) 07/17/2021 2026   CREATININE 1.45 (H) 05/12/2014 1430   GLUCOSE 87 07/17/2021 2026   CALCIUM 8.4 (L) 07/17/2021 2026   CBC:    Component Value Date/Time   WBC 18.6 (H) 07/17/2021 2026   HGB 10.2 (L) 07/17/2021 2026   HGB 10.1 (L) 04/19/2021 1133   HCT 28.9 (L) 07/17/2021 2026   HCT 27.8 (L) 04/19/2021 1133   PLT 242 07/17/2021 2026   PLT 353 04/19/2021 1133   MCV 107.4 (H) 07/17/2021 2026   MCV 101 (H) 04/19/2021 1133   NEUTROABS 11.2 (H) 07/17/2021 2026   NEUTROABS 7.9 (H) 04/19/2021 1133   LYMPHSABS 5.7 (H) 07/17/2021 2026   LYMPHSABS 7.0 (H) 04/19/2021 1133   MONOABS 1.3 (H) 07/17/2021 2026   EOSABS 0.3 07/17/2021 2026   EOSABS 0.3 04/19/2021 1133   BASOSABS 0.1 07/17/2021 2026   BASOSABS 0.1 04/19/2021 1133    No results found for this or any previous visit (from the past 240 hour(s)).  Studies/Results:  DG Ankle Complete Left  Result Date: 07/17/2021 CLINICAL DATA:  foot/ankle swelling, [name] cell EXAM: LEFT ANKLE COMPLETE - 3+ VIEW; LEFT FOOT - COMPLETE 3+ VIEW COMPARISON:  December 11, 2015. FINDINGS: Osteopenia. There is serpiginous sclerosis throughout the bones of the foot, most pronounced in the distal tibia and head of the first metatarsal. Severe joint space narrowing of the first MTP. Likely pes planus. No acute fracture or dislocation. Smooth cortical thickening of the posterior aspect of the tibia, likely  due to chronic reactive stress changes, mildly increased in comparison to more remote prior. IMPRESSION: 1. No acute fracture or dislocation. 2. Multifocal serpiginous sclerosis throughout the ankle and foot consistent with sequela of multiple bone infarctions/[name] cell disease. 3. Progressive joint space narrowing of the first MTP, likely due to avascular necrosis and metatarsal head collapse. 4. Mild increase in smooth cortical thickening along the posterior aspect of the tibia since 2017. This likely is due to chronic repetitive stress changes. If further imaging is desired, recommend dedicated MRI. Electronically Signed   By: Valentino Saxon M.D.   On: 07/17/2021 21:01   DG Foot Complete Left  Result Date: 07/17/2021 CLINICAL DATA:  foot/ankle swelling, [name] cell EXAM: LEFT ANKLE COMPLETE - 3+ VIEW; LEFT FOOT - COMPLETE 3+ VIEW COMPARISON:  December 11, 2015. FINDINGS: Osteopenia. There is serpiginous sclerosis throughout the bones of the foot, most pronounced in the distal tibia and head of the first metatarsal. Severe joint space narrowing of the first MTP. Likely pes planus. No acute fracture or dislocation. Smooth cortical thickening of the posterior aspect of the tibia, likely due to chronic reactive stress changes, mildly increased in comparison to more remote prior. IMPRESSION: 1. No acute fracture or dislocation. 2. Multifocal serpiginous sclerosis throughout the ankle and foot consistent with sequela of multiple bone infarctions/[name] cell disease. 3. Progressive joint space narrowing of the first MTP, likely due to avascular necrosis and metatarsal head collapse. 4. Mild increase in smooth cortical thickening along the posterior aspect of the tibia since 2017. This likely is due to chronic repetitive stress changes. If further imaging is desired, recommend dedicated MRI. Electronically Signed   By: Valentino Saxon M.D.   On: 07/17/2021 21:01    Medications: Scheduled Meds:  calcitRIOL   0.25 mcg Oral Daily   Deferiprone (Twice Daily)  1,000 mg Oral BID   enoxaparin (LOVENOX) injection  30 mg Subcutaneous Q24H   famotidine  20 mg Oral Daily   folic acid  1 mg Oral Daily   HYDROmorphone   Intravenous Q4H    HYDROmorphone (DILAUDID) injection  2 mg Intravenous Once   hydroxyurea  500 mg Oral Daily   multivitamin with minerals  1 tablet Oral Daily   [START ON 07/19/2021] patiromer  8.4 g Oral Daily   senna-docusate  1 tablet Oral BID   sodium bicarbonate  1,950 mg Oral TID   voxelotor  1,500 mg Oral Daily   Continuous Infusions:  dextrose 5 % and 0.45% NaCl 100 mL/hr at 07/18/21 1109   famotidine (PEPCID) IV     PRN Meds:.diphenhydrAMINE, naloxone **AND** sodium chloride flush, ondansetron **OR** ondansetron (ZOFRAN) IV, oxyCODONE, oxyCODONE, polyethylene glycol  Consultants: none  Procedures: none  Antibiotics: none  Assessment/Plan: Active Problems:   CKD (chronic kidney disease), stage IV (HCC)   Hyperkalemia   Polysubstance abuse (HCC)   GERD (gastroesophageal reflux disease)   [name] cell crisis (HCC)   [name] cell pain crisis ([place])  [name] cell disease with pain crisis: Continue  IV fluids, 0.45% saline at 100 mL/h IV Dilaudid PCA, no changes in settings Oxycodone 10 mg IV every 6 hours as needed OxyContin 15 mg every 12 hours Hold Toradol due to history of chronic kidney disease stage IV Monitor vital signs very closely, reevaluate pain scale regularly, and supplemental oxygen as needed  Anemia of chronic disease: Hemoglobin is stable and slightly above his baseline.  There is no clinical indication for blood transfusion at this time.  Continue home hydroxyurea and Oxbryta.  Continue to follow closely.  Hyperkalemia: Potassium 6.3 on admission, trending down following Lokelma.  Nephrology consulted, but not following patient.  Continue home calcitriol and bicarb will continue as needed. Follow labs in am.   Acute kidney injury superimposed on  stage IV chronic kidney disease: On admission, creatinine 3.8, which is greater than his baseline.  Patient's baseline is 2.5-3.0.  Continue gentle hydration.  Avoid all nephrotoxins.  [name] [name] is followed by nephrology as an outpatient.  Leukocytosis: WBCs 20.4, appear to be chronically elevated.  Patient is afebrile without signs of acute infection.  Continue to follow closely.  Labs in AM.  History of polysubstance abuse: Reviewed urine drug screen as results become available  Code Status: Full Code Family Communication: N/A Disposition Plan: Not yet ready for discharge  [name] [name]  If 7PM-7AM, please contact night-coverage.  07/18/2021, 11:12 AM  LOS: 0 days

## 2021-07-19 LAB — CBC
HCT: 22.4 % — ABNORMAL LOW (ref 39.0–52.0)
Hemoglobin: 7.8 g/dL — ABNORMAL LOW (ref 13.0–17.0)
MCH: 37 pg — ABNORMAL HIGH (ref 26.0–34.0)
MCHC: 34.8 g/dL (ref 30.0–36.0)
MCV: 106.2 fL — ABNORMAL HIGH (ref 80.0–100.0)
Platelets: 178 10*3/uL (ref 150–400)
RBC: 2.11 MIL/uL — ABNORMAL LOW (ref 4.22–5.81)
RDW: 21.3 % — ABNORMAL HIGH (ref 11.5–15.5)
WBC: 18.2 10*3/uL — ABNORMAL HIGH (ref 4.0–10.5)
nRBC: 0.1 % (ref 0.0–0.2)

## 2021-07-19 LAB — BASIC METABOLIC PANEL
Anion gap: 7 (ref 5–15)
BUN: 30 mg/dL — ABNORMAL HIGH (ref 6–20)
CO2: 21 mmol/L — ABNORMAL LOW (ref 22–32)
Calcium: 8.6 mg/dL — ABNORMAL LOW (ref 8.9–10.3)
Chloride: 108 mmol/L (ref 98–111)
Creatinine, Ser: 2.71 mg/dL — ABNORMAL HIGH (ref 0.61–1.24)
GFR, Estimated: 30 mL/min — ABNORMAL LOW (ref >60)
Glucose, Bld: 100 mg/dL — ABNORMAL HIGH (ref 70–99)
Potassium: 5.2 mmol/L — ABNORMAL HIGH (ref 3.5–5.1)
Sodium: 136 mmol/L (ref 135–145)

## 2021-07-19 LAB — LACTATE DEHYDROGENASE: LDH: 207 U/L — ABNORMAL HIGH (ref 98–192)

## 2021-07-19 MED ORDER — ACETAMINOPHEN 325 MG PO TABS
650.0000 mg | ORAL_TABLET | ORAL | Status: DC | PRN
  Filled 2021-07-19: qty 2

## 2021-07-19 MED ORDER — AMITRIPTYLINE HCL 25 MG PO TABS
50.0000 mg | ORAL_TABLET | Freq: Every day | ORAL | Status: DC | PRN
  Filled 2021-07-19: qty 2

## 2021-07-19 MED ORDER — AMITRIPTYLINE HCL 25 MG PO TABS
50.0000 mg | ORAL_TABLET | Freq: Every day | ORAL | Status: DC | PRN
  Administered 2021-07-19: 50 mg via ORAL

## 2021-07-19 NOTE — Progress Notes (Signed)
Subjective: [name] [name] is a 41 year old male with a medical history significant for sickle cell disease type SS, chronic kidney disease stage IV, histories of migraine, history of polysubstance abuse, GERD, osteonecrosis, and anemia of chronic disease was admitted for sickle cell pain crisis. Today, continues to complain about bilateral foot pain, primarily on right side.  Patient states that he has been unable to apply full weight to bilateral lower extremities over the past several days.  Patient denies any headache, chest pain, dizziness, urinary symptoms, nausea, vomiting, or diarrhea  Objective:  Vital signs in last 24 hours:  Vitals:   07/20/21 0543 07/20/21 0548 07/20/21 0722 07/20/21 1103  BP:  113/75  125/78  Pulse:  (!) 110  (!) 109  Resp: 16  16 16   Temp:  98.6 F (37 C)  98.5 F (36.9 C)  TempSrc:  Oral  Oral  SpO2: 96% 90% 90% 94%  Weight:      Height:        Intake/Output from previous day:   Intake/Output Summary (Last 24 hours) at 07/20/2021 1132 Last data filed at 07/20/2021 4944 Gross per 24 hour  Intake 4212.07 ml  Output 3350 ml  Net 862.07 ml    Physical Exam: General: Alert, awake, oriented x3, in no acute distress.  HEENT: [place]/AT PEERL, EOMI Neck: Trachea midline,  no masses, no thyromegal,y no JVD, no carotid bruit OROPHARYNX:  Moist, No exudate/ erythema/lesions.  Heart: Regular rate and rhythm, without murmurs, rubs, gallops, PMI non-displaced, no heaves or thrills on palpation.  Lungs: Clear to auscultation, no wheezing or rhonchi noted. No increased vocal fremitus resonant to percussion  Abdomen: Soft, nontender, nondistended, positive bowel sounds, no masses no hepatosplenomegaly noted..  Neuro: No focal neurological deficits noted cranial nerves II through XII grossly intact. DTRs 2+ bilaterally upper and lower extremities. Strength 5 out of 5 in bilateral upper and lower extremities. Musculoskeletal: No warm swelling or erythema around joints,  no spinal tenderness noted. Psychiatric: Patient alert and oriented x3, good insight and cognition, good recent to remote recall. Lymph node survey: No cervical axillary or inguinal lymphadenopathy noted.  Lab Results:  Basic Metabolic Panel:    Component Value Date/Time   NA 136 07/19/2021 2105   NA 139 04/19/2021 1133   K 5.2 (H) 07/19/2021 2105   CL 108 07/19/2021 2105   CO2 21 (L) 07/19/2021 2105   BUN 30 (H) 07/19/2021 2105   BUN 36 (H) 04/19/2021 1133   CREATININE 2.71 (H) 07/19/2021 2105   CREATININE 1.45 (H) 05/12/2014 1430   GLUCOSE 100 (H) 07/19/2021 2105   CALCIUM 8.6 (L) 07/19/2021 2105   CBC:    Component Value Date/Time   WBC 18.2 (H) 07/19/2021 2105   HGB 7.8 (L) 07/19/2021 2105   HGB 10.1 (L) 04/19/2021 1133   HCT 22.4 (L) 07/19/2021 2105   HCT 27.8 (L) 04/19/2021 1133   PLT 178 07/19/2021 2105   PLT 353 04/19/2021 1133   MCV 106.2 (H) 07/19/2021 2105   MCV 101 (H) 04/19/2021 1133   NEUTROABS 7.6 07/18/2021 1107   NEUTROABS 7.9 (H) 04/19/2021 1133   LYMPHSABS 11.1 (H) 07/18/2021 1107   LYMPHSABS 7.0 (H) 04/19/2021 1133   MONOABS 1.2 (H) 07/18/2021 1107   EOSABS 0.4 07/18/2021 1107   EOSABS 0.3 04/19/2021 1133   BASOSABS 0.1 07/18/2021 1107   BASOSABS 0.1 04/19/2021 1133    No results found for this or any previous visit (from the past 240 hour(s)).  Studies/Results: No results found.  Medications: Scheduled Meds:  calcitRIOL  0.25 mcg Oral Daily   Deferiprone (Twice Daily)  1,000 mg Oral BID   enoxaparin (LOVENOX) injection  30 mg Subcutaneous Q24H   famotidine  20 mg Oral Daily   folic acid  1 mg Oral Daily   HYDROmorphone   Intravenous Q4H   hydroxyurea  500 mg Oral Daily   multivitamin with minerals  1 tablet Oral Daily   patiromer  8.4 g Oral Daily   senna-docusate  1 tablet Oral BID   sodium bicarbonate  1,950 mg Oral TID   voxelotor  1,500 mg Oral Daily   Continuous Infusions:  dextrose 5 % and 0.45% NaCl 50 mL/hr at 07/20/21 3762    famotidine (PEPCID) IV     PRN Meds:.acetaminophen, amitriptyline, diphenhydrAMINE, naloxone **AND** sodium chloride flush, ondansetron **OR** ondansetron (ZOFRAN) IV, oxyCODONE, oxyCODONE, polyethylene glycol  Consultants: none  Procedures: none  Antibiotics: none  Assessment/Plan: Principal Problem:   Sickle cell pain crisis ([place]) Active Problems:   CKD (chronic kidney disease), stage IV (HCC)   Hyperkalemia   Polysubstance abuse (HCC)   GERD (gastroesophageal reflux disease)   Sickle cell crisis (HCC)  Sickle cell disease with pain crisis: Continue IV fluids at [facility] IV Dilaudid PCA, no changes in settings Oxycodone 10 mg IV every 6 hours as needed OxyContin 15 mg every 12 hours Hold Toradol due to history of chronic kidney disease stage IV Monitor vital signs very closely, reevaluate pain scale regularly, and supplemental oxygen as needed  Anemia of chronic disease: Hemoglobin is stable and slightly above his baseline.  There is no clinical indication for blood transfusion at this time.  Continue home hydroxyurea and Oxbryta.  Continue to follow closely.  Hyperkalemia: Potassium trending down following Lokelma.  Nephrology consulted, but not following patient.  Continue home calcitriol and bicarb will continue as needed. Follow labs in am.   Acute kidney injury superimposed on stage IV chronic kidney disease: On admission, creatinine 3.8, which is greater than his baseline.  Patient's baseline is 2.5-3.0.  Continue gentle hydration.  Avoid all nephrotoxins.  Sohail is followed by nephrology as an outpatient.  Leukocytosis: WBCs stable  Patient is afebrile without signs of acute infection.  Continue to follow closely.  Labs in AM.  History of polysubstance abuse: Reviewed urine drug screen as results become available  Code Status: Full Code Family Communication: N/A Disposition Plan: Not yet ready for discharge  [name], MSN, FNP-C Patient  [address], [place] [address] [phone]  If 7PM-7AM, please contact night-coverage.  07/20/2021, 11:32 AM  LOS: 2 days
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
## 2021-07-20 ENCOUNTER — Other Ambulatory Visit: Payer: Self-pay | Admitting: Family Medicine

## 2021-07-20 ENCOUNTER — Other Ambulatory Visit (HOSPITAL_COMMUNITY): Payer: Self-pay

## 2021-07-20 DIAGNOSIS — D5700 Hb-SS disease with crisis, unspecified: Secondary | ICD-10-CM | POA: Diagnosis not present

## 2021-07-20 LAB — COMPREHENSIVE METABOLIC PANEL
ALT: 34 U/L (ref 0–44)
AST: 49 U/L — ABNORMAL HIGH (ref 15–41)
Albumin: 3.5 g/dL (ref 3.5–5.0)
Alkaline Phosphatase: 165 U/L — ABNORMAL HIGH (ref 38–126)
Anion gap: 7 (ref 5–15)
BUN: 29 mg/dL — ABNORMAL HIGH (ref 6–20)
CO2: 20 mmol/L — ABNORMAL LOW (ref 22–32)
Calcium: 8.7 mg/dL — ABNORMAL LOW (ref 8.9–10.3)
Chloride: 109 mmol/L (ref 98–111)
Creatinine, Ser: 2.73 mg/dL — ABNORMAL HIGH (ref 0.61–1.24)
GFR, Estimated: 30 mL/min — ABNORMAL LOW (ref >60)
Glucose, Bld: 104 mg/dL — ABNORMAL HIGH (ref 70–99)
Potassium: 5.1 mmol/L (ref 3.5–5.1)
Sodium: 136 mmol/L (ref 135–145)
Total Bilirubin: 1.9 mg/dL — ABNORMAL HIGH (ref 0.3–1.2)
Total Protein: 7 g/dL (ref 6.5–8.1)

## 2021-07-20 LAB — CBC
HCT: 21.8 % — ABNORMAL LOW (ref 39.0–52.0)
Hemoglobin: 7.6 g/dL — ABNORMAL LOW (ref 13.0–17.0)
MCH: 37.1 pg — ABNORMAL HIGH (ref 26.0–34.0)
MCHC: 34.9 g/dL (ref 30.0–36.0)
MCV: 106.3 fL — ABNORMAL HIGH (ref 80.0–100.0)
Platelets: 179 10*3/uL (ref 150–400)
RBC: 2.05 MIL/uL — ABNORMAL LOW (ref 4.22–5.81)
RDW: 22.3 % — ABNORMAL HIGH (ref 11.5–15.5)
WBC: 20 10*3/uL — ABNORMAL HIGH (ref 4.0–10.5)
nRBC: 0.1 % (ref 0.0–0.2)

## 2021-07-20 LAB — TYPE AND SCREEN
ABO/RH(D): O POS
Antibody Screen: POSITIVE
DAT, IgG: NEGATIVE

## 2021-07-20 LAB — LACTATE DEHYDROGENASE: LDH: 252 U/L — ABNORMAL HIGH (ref 98–192)

## 2021-07-20 LAB — URIC ACID: Uric Acid, Serum: 6.9 mg/dL (ref 3.7–8.6)

## 2021-07-20 MED ORDER — OXYCODONE HCL ER 15 MG PO T12A
15.0000 mg | EXTENDED_RELEASE_TABLET | Freq: Two times a day (BID) | ORAL | 0 refills | Status: DC
  Filled 2021-07-20: qty 46, 23d supply, fill #0

## 2021-07-20 MED ORDER — OXYCODONE HCL 10 MG PO TABS
10.0000 mg | ORAL_TABLET | Freq: Four times a day (QID) | ORAL | 0 refills | Status: DC | PRN
  Filled 2021-07-20: qty 60, 15d supply, fill #0

## 2021-07-20 NOTE — Discharge Summary (Signed)
Physician Discharge Summary  [name] [name]. HYI:[id number] DOB: 04/02/1980 DOA: 07/17/2021  PCP: [name], FNP  Admit date: 07/17/2021  Discharge date: 07/20/2021  Discharge Diagnoses:  Principal Problem:   Sickle cell pain crisis ([place]) Active Problems:   CKD (chronic kidney disease), stage IV (HCC)   Hyperkalemia   Polysubstance abuse (HCC)   GERD (gastroesophageal reflux disease)   Sickle cell crisis [facility])   Discharge Condition: Stable  Disposition:   Follow-up Information     [name], FNP Follow up.   Specialty: Family Medicine Contact information: [place] [id number] [phone]         Buford Dresser, MD .   Specialty: Cardiology Contact information: [address] [place] [address] [phone]                Pt is discharged home in good condition and is to follow up with [name], FNP this week to have labs evaluated. [name] A Derflinger Jr. is instructed to increase activity slowly and balance with rest for the next few days, and use prescribed medication to complete treatment of pain  Diet: Regular Wt Readings from Last 3 Encounters:  07/17/21 56.7 kg  06/23/21 56.7 kg  04/19/21 56.8 kg    History of present illness:  [name] [name] is a 41 year old male with a medical history significant for sickle cell disease type SS, chronic kidney disease stage IV, history of migraine headaches, history of polysubstance use, current marijuana use, GERD, osteonecrosis, and anemia of chronic disease presented to the emergency department department with pain in multiple locations, including left flank, leg, and feet that similar to previous sickle cell pain.  However he has pain that he reported that is somewhat different in his left ankle and foot.  He states that he ran out of his oxycodone 3 days ago and has been attempting Tylenol since pain started without relief. He denies  fever, chills, chest pain, shortness of breath, constipation, diarrhea, nausea, or vomiting.  ED course: Vital signs in ED stable.  Lab work included CMP with potassium 6.3, chloride 116, bicarb 15, BUN 54, creatinine near baseline at 3.84, improved AST of 54 and improved alk phos of 162.  CBC shows stable hemoglobin 10.2, and stable leukocytosis of 18.6.  Reticulocyte count elevated at 4.8.  Patient was typed and screened in the ED.  Urinalysis show hemoglobin and protein.  X-ray of the foot and ankle on the left show changes secondary to bone infarctions in the past similar to MRI seen in 2021 and changes of avascular necrosis.  Patient received Dilaudid, oxycodone, Zofran, Lokelma, 0.45% saline in the emergency department.  Nephrologist consulted, recommended IV fluids as ordered, Lokelma until potassium is less than 5, and renal diet.  Hospital Course:  Sickle cell pain crisis: Patient was admitted for sickle cell pain crisis.  Initiated IV Dilaudid PCA and weaned appropriately.  Patient transition to his home medications that include OxyContin and oxycodone.  Patient will resume home opiate medication regimen.  He is out of oxycodone at home.  PDMP substance reporting system reviewed, no inconsistencies noted.  Oxycodone 10 mg every 6 hours #60 was sent to patient's pharmacy.  Patient will follow-up with PCP on 07/26/2021 for medication management.  Patient is aware of his upcoming appointments.  Chronic kidney disease stage IV/hyperkalemia: On admission, patient's potassium was 6.3.  He has a history of hyperkalemia.  He is followed as  an outpatient by nephrology.  Nephrology was consulted and recommended gentle hydration and Lokelma until potassium is less than 5.  Potassium trended to baseline and patient will follow-up with his nephrology team as scheduled.  Also, recommend that he resumes all medications prescribed by nephrology.  Anemia of chronic disease: Patient's baseline hemoglobin is 8-9  g/dL.  Prior to discharge, hemoglobin decreased at 7.6.  He did not warrant blood transfusion.  However, he is advised to follow-up with his hematology team, first available appointment.  Patient to continue hydroxyurea and Oxbryta.  He will follow-up in clinic on 07/26/2021, at that time we will repeat labs.  Patient is alert, oriented, and ambulating without assistance.  He is requesting discharge home.  His pain intensity is 5/10.  He is aware of all upcoming appointments.  Patient was therefore discharged home today in a hemodynamically stable condition.   Kartik will follow-up with PCP within 1 week of this discharge. Orland was counseled extensively about nonpharmacologic means of pain management, patient verbalized understanding and was appreciative of  the care received during this admission.   We discussed the need for good hydration, monitoring of hydration status, avoidance of heat, cold, stress, and infection triggers. We discussed the need to be adherent with taking Hydrea and other home medications. Patient was reminded of the need to seek medical attention immediately if any symptom of bleeding, anemia, or infection occurs.  Discharge Exam: Vitals:   07/20/21 1103 07/20/21 1221  BP: 125/78   Pulse: (!) 109   Resp: 16 16  Temp: 98.5 F (36.9 C)   SpO2: 94% 92%   Vitals:   07/20/21 0548 07/20/21 0722 07/20/21 1103 07/20/21 1221  BP: 113/75  125/78   Pulse: (!) 110  (!) 109   Resp:  '16 16 16  ' Temp: 98.6 F (37 C)  98.5 F (36.9 C)   TempSrc: Oral  Oral   SpO2: 90% 90% 94% 92%  Weight:      Height:        General appearance : Awake, alert, not in any distress. Speech Clear. Not toxic looking HEENT: Atraumatic and Normocephalic, pupils equally reactive to light and accomodation Neck: Supple, no JVD. No cervical lymphadenopathy.  Chest: Good air entry bilaterally, no added sounds  CVS: S1 S2 regular, no murmurs.  Abdomen: Bowel sounds present, Non tender and not  distended with no gaurding, rigidity or rebound. Extremities: B/L Lower Ext shows no edema, both legs are warm to touch Neurology: Awake alert, and oriented X 3, CN II-XII intact, Non focal Skin: No Rash  Discharge Instructions  Discharge Instructions     Discharge patient   Complete by: As directed    Discharge disposition: 01-Home or Self Care   Discharge patient date: 07/20/2021      Allergies as of 07/20/2021       Reactions   Nsaids Other (See Comments)   Kidney disease   Morphine And Related Other (See Comments)   "Real bad headaches"         Medication List     TAKE these medications    acetaminophen 650 MG CR tablet Commonly known as: TYLENOL Take 650 mg by mouth every 8 (eight) hours as needed for pain.   amitriptyline 25 MG tablet Commonly known as: ELAVIL Take 50 mg by mouth at bedtime.   calcitRIOL 0.25 MCG capsule Commonly known as: ROCALTROL Take 0.25 mcg by mouth daily.   Ferriprox Twice-A-Day 1000 MG Tabs Generic drug: Deferiprone (Twice Daily)  Take 1,000 mg by mouth 2 (two) times daily.   folic acid 1 MG tablet Commonly known as: FOLVITE TAKE 1 TABLET BY MOUTH EVERY DAY   hydroxyurea 500 MG capsule Commonly known as: HYDREA Take 500 mg by mouth daily.   Oxbryta 500 MG Tabs tablet Generic drug: voxelotor Take 1,500 mg by mouth daily.   oxyCODONE 15 mg 12 hr tablet Commonly known as: OXYCONTIN Take 1 tablet (15 mg total) by mouth every 12 (twelve) hours. What changed:  when to take this reasons to take this   Oxycodone HCl 10 MG Tabs Take 1 tablet (10 mg total) by mouth every 6 (six) hours as needed. What changed: reasons to take this   sodium bicarbonate 650 MG tablet Take 1,950 mg by mouth 3 (three) times daily.   Veltassa 8.4 g packet Generic drug: patiromer Take 1 packet by mouth daily.        The results of significant diagnostics from this hospitalization (including imaging, microbiology, ancillary and laboratory) are  listed below for reference.    Significant Diagnostic Studies: DG Chest 2 View  Result Date: 06/23/2021 CLINICAL DATA:  Sickle cell disease. Complains of sickle cell pain and bilateral arms and shortness of breath. EXAM: CHEST - 2 VIEW COMPARISON:  02/06/2021 FINDINGS: Stable cardiomediastinal contours. No signs of pleural effusion or edema. Chronic scarring is identified within the retrocardiac left lung base. No superimposed airspace consolidation. Bony stigmata of sickle cell disease is again noted with diffuse osseous sclerosis and avascular necrosis within both humeral heads. IMPRESSION: No acute cardiopulmonary abnormalities. Chronic changes of sickle cell disease as described above. Electronically Signed   By: Kerby Moors M.D.   On: 06/23/2021 12:24   DG Ankle Complete Left  Result Date: 07/17/2021 CLINICAL DATA:  foot/ankle swelling, sickle cell EXAM: LEFT ANKLE COMPLETE - 3+ VIEW; LEFT FOOT - COMPLETE 3+ VIEW COMPARISON:  December 11, 2015. FINDINGS: Osteopenia. There is serpiginous sclerosis throughout the bones of the foot, most pronounced in the distal tibia and head of the first metatarsal. Severe joint space narrowing of the first MTP. Likely pes planus. No acute fracture or dislocation. Smooth cortical thickening of the posterior aspect of the tibia, likely due to chronic reactive stress changes, mildly increased in comparison to more remote prior. IMPRESSION: 1. No acute fracture or dislocation. 2. Multifocal serpiginous sclerosis throughout the ankle and foot consistent with sequela of multiple bone infarctions/sickle cell disease. 3. Progressive joint space narrowing of the first MTP, likely due to avascular necrosis and metatarsal head collapse. 4. Mild increase in smooth cortical thickening along the posterior aspect of the tibia since 2017. This likely is due to chronic repetitive stress changes. If further imaging is desired, recommend dedicated MRI. Electronically Signed   By:  Valentino Saxon M.D.   On: 07/17/2021 21:01   DG Foot Complete Left  Result Date: 07/17/2021 CLINICAL DATA:  foot/ankle swelling, sickle cell EXAM: LEFT ANKLE COMPLETE - 3+ VIEW; LEFT FOOT - COMPLETE 3+ VIEW COMPARISON:  December 11, 2015. FINDINGS: Osteopenia. There is serpiginous sclerosis throughout the bones of the foot, most pronounced in the distal tibia and head of the first metatarsal. Severe joint space narrowing of the first MTP. Likely pes planus. No acute fracture or dislocation. Smooth cortical thickening of the posterior aspect of the tibia, likely due to chronic reactive stress changes, mildly increased in comparison to more remote prior. IMPRESSION: 1. No acute fracture or dislocation. 2. Multifocal serpiginous sclerosis throughout the ankle and foot consistent  with sequela of multiple bone infarctions/sickle cell disease. 3. Progressive joint space narrowing of the first MTP, likely due to avascular necrosis and metatarsal head collapse. 4. Mild increase in smooth cortical thickening along the posterior aspect of the tibia since 2017. This likely is due to chronic repetitive stress changes. If further imaging is desired, recommend dedicated MRI. Electronically Signed   By: Valentino Saxon M.D.   On: 07/17/2021 21:01    Microbiology: No results found for this or any previous visit (from the past 240 hour(s)).   Labs: Basic Metabolic Panel: Recent Labs  Lab 07/17/21 2026 07/18/21 0108 07/18/21 1107 07/18/21 1515 07/19/21 2105 07/20/21 1208  NA 142  --  138  --  136 136  K 6.3*   < > 4.0   < > 5.2* 5.1  CL 116*  --  110  --  108 109  CO2 15*  --  19*  --  21* 20*  GLUCOSE 87  --  123*  --  100* 104*  BUN 54*  --  48*  --  30* 29*  CREATININE 3.84*  --  3.19*  --  2.71* 2.73*  CALCIUM 8.4*  --  8.3*  --  8.6* 8.7*   < > = values in this interval not displayed.   Liver Function Tests: Recent Labs  Lab 07/17/21 2026 07/20/21 1208  AST 54* 49*  ALT 40 34  ALKPHOS  162* 165*  BILITOT 1.2 1.9*  PROT 7.9 7.0  ALBUMIN 4.0 3.5   No results for input(s): LIPASE, AMYLASE in the last 168 hours. No results for input(s): AMMONIA in the last 168 hours. CBC: Recent Labs  Lab 07/17/21 2026 07/18/21 1107 07/19/21 2105 07/20/21 1208  WBC 18.6* 20.4* 18.2* 20.0*  NEUTROABS 11.2* 7.6  --   --   HGB 10.2* 8.4* 7.8* 7.6*  HCT 28.9* 23.4* 22.4* 21.8*  MCV 107.4* 105.9* 106.2* 106.3*  PLT 242 179 178 179   Cardiac Enzymes: No results for input(s): CKTOTAL, CKMB, CKMBINDEX, TROPONINI in the last 168 hours. BNP: Invalid input(s): POCBNP CBG: No results for input(s): GLUCAP in the last 168 hours.  Time coordinating discharge: 30 minutes  Signed: [name]  APRN, MSN, FNP-C [facility] [address] [place],  [address] [phone]  [facility] 07/20/2021, 2:38 PM

## 2021-07-20 NOTE — Progress Notes (Signed)
Patient is being discharged home. Discharge papers reviewed. Patient verbalized understanding. Belongings returned, patients mom is his ride home.

## 2021-07-22 ENCOUNTER — Telehealth: Payer: Self-pay | Admitting: *Deleted

## 2021-07-22 NOTE — Telephone Encounter (Signed)
Transition Care Management Unsuccessful Follow-up Telephone Call  Date of discharge and from where:  07/20/21 [facility]  Attempts:  1st Attempt  Reason for unsuccessful TCM follow-up call:  Left voice message  Kelli Churn RN, CCM, [place] Management Coordinator  [phone]

## 2021-07-26 ENCOUNTER — Ambulatory Visit (INDEPENDENT_AMBULATORY_CARE_PROVIDER_SITE_OTHER): Payer: Medicare Other | Admitting: Family Medicine

## 2021-07-26 ENCOUNTER — Encounter: Payer: Self-pay | Admitting: Family Medicine

## 2021-07-26 VITALS — BP 113/87 | HR 104 | Temp 98.3°F | Ht 63.0 in | Wt 125.2 lb

## 2021-07-26 DIAGNOSIS — N184 Chronic kidney disease, stage 4 (severe): Secondary | ICD-10-CM | POA: Diagnosis not present

## 2021-07-26 DIAGNOSIS — D571 Sickle-cell disease without crisis: Secondary | ICD-10-CM

## 2021-07-26 DIAGNOSIS — E559 Vitamin D deficiency, unspecified: Secondary | ICD-10-CM | POA: Diagnosis not present

## 2021-07-26 DIAGNOSIS — G894 Chronic pain syndrome: Secondary | ICD-10-CM
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
## 2021-07-28 ENCOUNTER — Telehealth: Payer: Self-pay

## 2021-07-28 LAB — CMP14+CBC/D/PLT+FER+RETIC+V...
ALT: 54 IU/L — ABNORMAL HIGH (ref 0–44)
AST: 92 IU/L — ABNORMAL HIGH (ref 0–40)
Albumin/Globulin Ratio: 1.5 (ref 1.2–2.2)
Albumin: 4.2 g/dL (ref 4.0–5.0)
Alkaline Phosphatase: 260 IU/L — ABNORMAL HIGH (ref 44–121)
BUN/Creatinine Ratio: 16 (ref 9–20)
BUN: 57 mg/dL — ABNORMAL HIGH (ref 6–20)
Basophils Absolute: 0.1 10*3/uL (ref 0.0–0.2)
Basos: 0 %
Bilirubin Total: 1.5 mg/dL — ABNORMAL HIGH (ref 0.0–1.2)
CO2: 15 mmol/L — ABNORMAL LOW (ref 20–29)
Calcium: 8.4 mg/dL — ABNORMAL LOW (ref 8.7–10.2)
Chloride: 97 mmol/L (ref 96–106)
Creatinine, Ser: 3.66 mg/dL — ABNORMAL HIGH (ref 0.76–1.27)
EOS (ABSOLUTE): 0.5 10*3/uL — ABNORMAL HIGH (ref 0.0–0.4)
Eos: 2 %
Ferritin: 6969 ng/mL — ABNORMAL HIGH (ref 30–400)
Globulin, Total: 2.8 g/dL (ref 1.5–4.5)
Glucose: 88 mg/dL (ref 70–99)
Hematocrit: 18 % — CL (ref 37.5–51.0)
Hemoglobin: 6.6 g/dL — CL (ref 13.0–17.7)
Immature Grans (Abs): 0.1 10*3/uL (ref 0.0–0.1)
Immature Granulocytes: 1 %
Lymphocytes Absolute: 8.9 10*3/uL — ABNORMAL HIGH (ref 0.7–3.1)
Lymphs: 42 %
MCH: 36.7 pg — ABNORMAL HIGH (ref 26.6–33.0)
MCHC: 36.7 g/dL — ABNORMAL HIGH (ref 31.5–35.7)
MCV: 100 fL — ABNORMAL HIGH (ref 79–97)
Monocytes Absolute: 1.9 10*3/uL — ABNORMAL HIGH (ref 0.1–0.9)
Monocytes: 9 %
Neutrophils Absolute: 10.1 10*3/uL — ABNORMAL HIGH (ref 1.4–7.0)
Neutrophils: 46 %
Platelets: 244 10*3/uL (ref 150–450)
Potassium: 5 mmol/L (ref 3.5–5.2)
RBC: 1.8 x10E6/uL — CL (ref 4.14–5.80)
RDW: 22.3 % — ABNORMAL HIGH (ref 11.6–15.4)
Retic Ct Pct: 8.8 % — ABNORMAL HIGH (ref 0.6–2.6)
Sodium: 133 mmol/L — ABNORMAL LOW (ref 134–144)
Total Protein: 7 g/dL (ref 6.0–8.5)
Vit D, 25-Hydroxy: 21.7 ng/mL — ABNORMAL LOW (ref 30.0–100.0)
WBC: 21.5 10*3/uL (ref 3.4–10.8)
eGFR: 21 mL/min/{1.73_m2} — ABNORMAL LOW (ref >59)

## 2021-07-29 NOTE — Telephone Encounter (Signed)
No additional notes needed  

## 2021-07-30 ENCOUNTER — Emergency Department (HOSPITAL_COMMUNITY): Payer: Medicare Other

## 2021-07-30 ENCOUNTER — Inpatient Hospital Stay (HOSPITAL_COMMUNITY)
Admission: EM | Admit: 2021-07-30 | Discharge: 2021-08-02 | DRG: 812 | Disposition: A | Discharge status: Home / Self Care | Payer: Medicare Other | Attending: Internal Medicine | Admitting: Internal Medicine

## 2021-07-30 ENCOUNTER — Other Ambulatory Visit: Payer: Self-pay

## 2021-07-30 DIAGNOSIS — D631 Anemia in chronic kidney disease: Secondary | ICD-10-CM | POA: Diagnosis present

## 2021-07-30 DIAGNOSIS — N184 Chronic kidney disease, stage 4 (severe): Secondary | ICD-10-CM | POA: Diagnosis present

## 2021-07-30 DIAGNOSIS — D5700 Hb-SS disease with crisis, unspecified: Principal | ICD-10-CM | POA: Diagnosis present

## 2021-07-30 DIAGNOSIS — N08 Glomerular disorders in diseases classified elsewhere: Secondary | ICD-10-CM | POA: Diagnosis present

## 2021-07-30 DIAGNOSIS — N179 Acute kidney failure, unspecified: Secondary | ICD-10-CM | POA: Diagnosis present

## 2021-07-30 DIAGNOSIS — F1120 Opioid dependence, uncomplicated: Secondary | ICD-10-CM | POA: Diagnosis present

## 2021-07-30 DIAGNOSIS — R112 Nausea with vomiting, unspecified: Secondary | ICD-10-CM

## 2021-07-30 DIAGNOSIS — G894 Chronic pain syndrome: Secondary | ICD-10-CM | POA: Diagnosis present

## 2021-07-30 DIAGNOSIS — Z885 Allergy status to narcotic agent status: Secondary | ICD-10-CM | POA: Diagnosis not present

## 2021-07-30 DIAGNOSIS — Z87891 Personal history of nicotine dependence: Secondary | ICD-10-CM | POA: Diagnosis not present

## 2021-07-30 DIAGNOSIS — K219 Gastro-esophageal reflux disease without esophagitis: Secondary | ICD-10-CM | POA: Diagnosis present

## 2021-07-30 DIAGNOSIS — N189 Chronic kidney disease, unspecified: Secondary | ICD-10-CM | POA: Diagnosis present

## 2021-07-30 DIAGNOSIS — D72829 Elevated white blood cell count, unspecified: Secondary | ICD-10-CM | POA: Diagnosis present

## 2021-07-30 DIAGNOSIS — Z886 Allergy status to analgesic agent status: Secondary | ICD-10-CM

## 2021-07-30 DIAGNOSIS — Z79899 Other long term (current) drug therapy: Secondary | ICD-10-CM

## 2021-07-30 LAB — CBC WITH DIFFERENTIAL/PLATELET
Abs Immature Granulocytes: 0.1 10*3/uL — ABNORMAL HIGH (ref 0.00–0.07)
Basophils Absolute: 0.1 10*3/uL (ref 0.0–0.1)
Basophils Relative: 0 %
Eosinophils Absolute: 0.4 10*3/uL (ref 0.0–0.5)
Eosinophils Relative: 2 %
HCT: 23.3 % — ABNORMAL LOW (ref 39.0–52.0)
Hemoglobin: 8.2 g/dL — ABNORMAL LOW (ref 13.0–17.0)
Immature Granulocytes: 0 %
Lymphocytes Relative: 45 %
Lymphs Abs: 10 10*3/uL — ABNORMAL HIGH (ref 0.7–4.0)
MCH: 37.3 pg — ABNORMAL HIGH (ref 26.0–34.0)
MCHC: 35.2 g/dL (ref 30.0–36.0)
MCV: 105.9 fL — ABNORMAL HIGH (ref 80.0–100.0)
Monocytes Absolute: 1.5 10*3/uL — ABNORMAL HIGH (ref 0.1–1.0)
Monocytes Relative: 7 %
Neutro Abs: 10.4 10*3/uL — ABNORMAL HIGH (ref 1.7–7.7)
Neutrophils Relative %: 46 %
Platelets: 319 10*3/uL (ref 150–400)
RBC: 2.2 MIL/uL — ABNORMAL LOW (ref 4.22–5.81)
RDW: 25.7 % — ABNORMAL HIGH (ref 11.5–15.5)
WBC: 22.5 10*3/uL — ABNORMAL HIGH (ref 4.0–10.5)
nRBC: 0.3 % — ABNORMAL HIGH (ref 0.0–0.2)

## 2021-07-30 LAB — COMPREHENSIVE METABOLIC PANEL
ALT: 40 U/L (ref 0–44)
AST: 54 U/L — ABNORMAL HIGH (ref 15–41)
Albumin: 3.9 g/dL (ref 3.5–5.0)
Alkaline Phosphatase: 176 U/L — ABNORMAL HIGH (ref 38–126)
Anion gap: 11 (ref 5–15)
BUN: 44 mg/dL — ABNORMAL HIGH (ref 6–20)
CO2: 24 mmol/L (ref 22–32)
Calcium: 8.8 mg/dL — ABNORMAL LOW (ref 8.9–10.3)
Chloride: 102 mmol/L (ref 98–111)
Creatinine, Ser: 3.72 mg/dL — ABNORMAL HIGH (ref 0.61–1.24)
GFR, Estimated: 20 mL/min — ABNORMAL LOW (ref >60)
Glucose, Bld: 93 mg/dL (ref 70–99)
Potassium: 4 mmol/L (ref 3.5–5.1)
Sodium: 137 mmol/L (ref 135–145)
Total Bilirubin: 1.5 mg/dL — ABNORMAL HIGH (ref 0.3–1.2)
Total Protein: 7.9 g/dL (ref 6.5–8.1)

## 2021-07-30 LAB — URINALYSIS, ROUTINE W REFLEX MICROSCOPIC
Bacteria, UA: NONE SEEN
Bilirubin Urine: NEGATIVE
Glucose, UA: NEGATIVE mg/dL
Ketones, ur: NEGATIVE mg/dL
Leukocytes,Ua: NEGATIVE
Nitrite: NEGATIVE
Protein, ur: 100 mg/dL — AB
Specific Gravity, Urine: 1.013 (ref 1.005–1.030)
pH: 6 (ref 5.0–8.0)

## 2021-07-30 LAB — RAPID URINE DRUG SCREEN, HOSP PERFORMED
Amphetamines: NOT DETECTED
Barbiturates: NOT DETECTED
Benzodiazepines: NOT DETECTED
Cocaine: NOT DETECTED
Opiates: POSITIVE — AB
Tetrahydrocannabinol: POSITIVE — AB

## 2021-07-30 LAB — RETICULOCYTES
Immature Retic Fract: 26.1 % — ABNORMAL HIGH (ref 2.3–15.9)
RBC.: 1.98 MIL/uL — ABNORMAL LOW (ref 4.22–5.81)
Retic Count, Absolute: 341.1 10*3/uL — ABNORMAL HIGH (ref 19.0–186.0)
Retic Ct Pct: 17.2 % — ABNORMAL HIGH (ref 0.4–3.1)

## 2021-07-30 MED ORDER — DIPHENHYDRAMINE HCL 25 MG PO CAPS: 25.0000 mg | ORAL_CAPSULE | ORAL | Status: DC | PRN

## 2021-07-30 MED ORDER — AMITRIPTYLINE HCL 25 MG PO TABS
50.0000 mg | ORAL_TABLET | Freq: Every day | ORAL | Status: DC
  Administered 2021-07-30 – 2021-08-01 (×3): 50 mg via ORAL
  Filled 2021-07-30 (×3): qty 2

## 2021-07-30 MED ORDER — VOXELOTOR 500 MG PO TABS
1500.0000 mg | ORAL_TABLET | Freq: Every day | ORAL | Status: DC
  Administered 2021-07-31: 1500 mg via ORAL

## 2021-07-30 MED ORDER — OXYCODONE HCL ER 15 MG PO T12A
15.0000 mg | EXTENDED_RELEASE_TABLET | Freq: Two times a day (BID) | ORAL | Status: DC
  Administered 2021-07-30 – 2021-08-02 (×6): 15 mg via ORAL
  Filled 2021-07-30 (×6): qty 1

## 2021-07-30 MED ORDER — ONDANSETRON HCL 4 MG/2ML IJ SOLN
4.0000 mg | Freq: Four times a day (QID) | INTRAMUSCULAR | Status: DC | PRN
  Administered 2021-07-30: 4 mg via INTRAVENOUS
  Filled 2021-07-30: qty 2

## 2021-07-30 MED ORDER — HYDROMORPHONE HCL 2 MG/ML IJ SOLN
2.0000 mg | INTRAMUSCULAR | Status: AC
  Administered 2021-07-30: 2 mg via SUBCUTANEOUS
  Filled 2021-07-30: qty 1

## 2021-07-30 MED ORDER — DEXTROSE-NACL 5-0.45 % IV SOLN: INTRAVENOUS | Status: DC

## 2021-07-30 MED ORDER — ONDANSETRON HCL 4 MG/2ML IJ SOLN
4.0000 mg | INTRAMUSCULAR | Status: DC | PRN
  Administered 2021-07-31 – 2021-08-02 (×2): 4 mg via INTRAVENOUS
  Filled 2021-07-30: qty 2

## 2021-07-30 MED ORDER — POLYETHYLENE GLYCOL 3350 17 G PO PACK: 17.0000 g | PACK | Freq: Every day | ORAL | Status: DC | PRN

## 2021-07-30 MED ORDER — HYDROMORPHONE 1 MG/ML IV SOLN
INTRAVENOUS | Status: DC
  Administered 2021-07-30: 6 mg via INTRAVENOUS
  Administered 2021-07-30: 30 mg via INTRAVENOUS
  Administered 2021-07-31: 7 mg via INTRAVENOUS
  Administered 2021-07-31: 30 mg via INTRAVENOUS
  Administered 2021-07-31: 3 mg via INTRAVENOUS
  Administered 2021-07-31: 1 mg via INTRAVENOUS
  Administered 2021-07-31: 2.5 mg via INTRAVENOUS
  Administered 2021-07-31: 4 mg via INTRAVENOUS
  Administered 2021-07-31: 0.5 mg via INTRAVENOUS
  Administered 2021-07-31: 1 mg via INTRAVENOUS
  Administered 2021-08-01: 4.5 mg via INTRAVENOUS
  Administered 2021-08-01: 6 mg via INTRAVENOUS
  Administered 2021-08-01: 0.5 mg via INTRAVENOUS
  Filled 2021-07-30 (×2): qty 30

## 2021-07-30 MED ORDER — ONDANSETRON HCL 4 MG/2ML IJ SOLN
4.0000 mg | INTRAMUSCULAR | Status: DC | PRN
  Administered 2021-07-30: 4 mg via INTRAVENOUS
  Filled 2021-07-30 (×2): qty 2

## 2021-07-30 MED ORDER — ONDANSETRON HCL 4 MG PO TABS: 4.0000 mg | ORAL_TABLET | ORAL | Status: DC | PRN

## 2021-07-30 MED ORDER — FOLIC ACID 1 MG PO TABS
1.0000 mg | ORAL_TABLET | Freq: Every morning | ORAL | Status: DC
  Administered 2021-07-31 – 2021-08-02 (×3): 1 mg via ORAL
  Filled 2021-07-30 (×3): qty 1

## 2021-07-30 MED ORDER — SODIUM CHLORIDE 0.9% FLUSH: 9.0000 mL | INTRAVENOUS | Status: DC | PRN

## 2021-07-30 MED ORDER — SODIUM CHLORIDE 0.45 % IV SOLN: INTRAVENOUS | Status: DC

## 2021-07-30 MED ORDER — DEFERIPRONE (TWICE DAILY) 1000 MG PO TABS
1000.0000 mg | ORAL_TABLET | Freq: Two times a day (BID) | ORAL | Status: DC
  Administered 2021-07-31 – 2021-08-02 (×4): 1000 mg via ORAL

## 2021-07-30 MED ORDER — ACETAMINOPHEN 325 MG PO TABS
650.0000 mg | ORAL_TABLET | Freq: Four times a day (QID) | ORAL | Status: DC | PRN
Start: 2021-07-30 — End: 2021-08-02
  Administered 2021-07-30 – 2021-07-31 (×2): 650 mg via ORAL
  Filled 2021-07-30: qty 2

## 2021-07-30 MED ORDER — HYDROXYUREA 500 MG PO CAPS
1000.0000 mg | ORAL_CAPSULE | ORAL | Status: DC
  Administered 2021-08-02: 1000 mg via ORAL
  Filled 2021-07-30: qty 2

## 2021-07-30 MED ORDER — HYDROMORPHONE HCL 1 MG/ML IJ SOLN
1.0000 mg | INTRAMUSCULAR | Status: DC | PRN
  Administered 2021-07-30: 1 mg via INTRAVENOUS
  Filled 2021-07-30: qty 1

## 2021-07-30 MED ORDER — NALOXONE HCL 0.4 MG/ML IJ SOLN: 0.4000 mg | INTRAMUSCULAR | Status: DC | PRN

## 2021-07-30 MED ORDER — SODIUM BICARBONATE 650 MG PO TABS
1950.0000 mg | ORAL_TABLET | Freq: Three times a day (TID) | ORAL | Status: DC
  Administered 2021-07-30 – 2021-08-02 (×8): 1950 mg via ORAL
  Filled 2021-07-30 (×8): qty 3

## 2021-07-30 MED ORDER — PATIROMER SORBITEX CALCIUM 8.4 G PO PACK
8.4000 g | PACK | Freq: Every day | ORAL | Status: DC
  Administered 2021-07-30 – 2021-08-02 (×3): 8.4 g via ORAL
  Filled 2021-07-30 (×4): qty 1

## 2021-07-30 MED ORDER — ENOXAPARIN SODIUM 30 MG/0.3ML IJ SOSY
30.0000 mg | PREFILLED_SYRINGE | INTRAMUSCULAR | Status: DC
  Administered 2021-07-30: 30 mg via SUBCUTANEOUS
  Filled 2021-07-30: qty 0.3

## 2021-07-30 MED ORDER — DIPHENHYDRAMINE HCL 50 MG/ML IJ SOLN
12.5000 mg | Freq: Once | INTRAMUSCULAR | Status: AC
  Administered 2021-07-30: 12.5 mg via INTRAVENOUS
  Filled 2021-07-30: qty 12.5

## 2021-07-30 MED ORDER — SENNOSIDES-DOCUSATE SODIUM 8.6-50 MG PO TABS
1.0000 | ORAL_TABLET | Freq: Two times a day (BID) | ORAL | Status: DC
  Administered 2021-07-31: 1 via ORAL
  Filled 2021-07-30 (×7): qty 1

## 2021-07-30 MED ORDER — HYDROXYUREA 500 MG PO CAPS
500.0000 mg | ORAL_CAPSULE | ORAL | Status: DC
  Administered 2021-07-31 – 2021-08-01 (×2): 500 mg via ORAL
  Filled 2021-07-30 (×2): qty 1

## 2021-07-30 MED ORDER — SODIUM CHLORIDE 0.9 % IV BOLUS
500.0000 mL | Freq: Once | INTRAVENOUS | Status: AC
  Administered 2021-07-30: 500 mL via INTRAVENOUS

## 2021-07-30 MED ORDER — CALCITRIOL 0.25 MCG PO CAPS
0.2500 ug | ORAL_CAPSULE | Freq: Every day | ORAL | Status: DC
  Administered 2021-07-31 – 2021-08-02 (×3): 0.25 ug via ORAL
  Filled 2021-07-30 (×3): qty 1

## 2021-07-30 MED ORDER — ACETAMINOPHEN 325 MG PO TABS
ORAL_TABLET | ORAL | Status: AC
  Filled 2021-07-30: qty 2

## 2021-07-30 MED ORDER — HYDROMORPHONE HCL 1 MG/ML IJ SOLN
1.0000 mg | Freq: Once | INTRAMUSCULAR | Status: AC
  Administered 2021-07-30: 1 mg via SUBCUTANEOUS
  Filled 2021-07-30: qty 1

## 2021-07-30 MED ORDER — HYDROXYUREA 500 MG PO CAPS: 500.0000 mg | ORAL_CAPSULE | ORAL | Status: DC

## 2021-07-30 MED ORDER — PROCHLORPERAZINE EDISYLATE 10 MG/2ML IJ SOLN
10.0000 mg | Freq: Once | INTRAMUSCULAR | Status: AC
  Administered 2021-07-30: 10 mg via INTRAVENOUS
  Filled 2021-07-30: qty 2

## 2021-07-30 NOTE — ED Notes (Addendum)
Patient refusing subcutaneous dilaudid administration at this time stating that "it just doesn't work for me" and "it doesn't help my pain." Patient is frustrated that his dilaudid is not ordered for IV administration at this time and is requesting to speak with the doctor. MD aware.

## 2021-07-30 NOTE — ED Triage Notes (Signed)
Patient c/o sickle cell pain 8/10, all over body. Reports chest pain x2 days. Says he has black mold in apartment making him sick.

## 2021-07-30 NOTE — H&P (Signed)
History and Physical    Patient: [name] [name]. MVH:[id number] DOB: Nov 27, 1980 DOA: 07/30/2021 DOS: the patient was seen and examined on 07/30/2021 PCP: [name], FNP  Patient coming from: Home  Chief Complaint:  Chief Complaint  Patient presents with   Sickle Cell Pain Crisis   Chest Pain   HPI: [name] [name]. is a 41 y.o. male with medical history significant of sickle cell disease, anemia of chronic disease, polysubstance abuse, chronic kidney disease stage IV, who presents to the ER with complaint of pain in his back and legs.  Pain is consistent with his typical sickle cell crisis.  Patient has taken his home regimen but no relief.  He also complained of some members of his family have been sick.  He was seen in the ER and evaluated.  Received 6 mg of subcutaneous Dilaudid with no relief in his pain.  Patient is subsequently being admitted to the hospital for sickle cell pain management.  Patient is a frequent flyer with recurrent crisis.  Review of Systems: As mentioned in the history of present illness. All other systems reviewed and are negative. Past Medical History:  Diagnosis Date   Abscess of right iliac fossa 09/24/2018   required Perc Drain 09/24/2018   Arachnoid Cyst of brain bilaterally    "2 really small ones in the back of my head; inside; saw them w/MRI" (09/25/2012)   Bacterial pneumonia ~ 2012   "caught it here in the hospital" (09/25/2012)   Chronic kidney disease    "from my sickle cell" (09/25/2012)   CKD (chronic kidney disease), stage II    Colitis 04/19/2017   CT scan abd/ pelvis   GERD (gastroesophageal reflux disease)    "after I eat alot of spicey foods" (09/25/2012)   Gynecomastia, male 07/10/2012   History of blood transfusion    "always related to sickle cell crisis" (09/25/2012)   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   Noted in noted from Hematology notes at [facility]    "take RX qd to prevent them"  (09/25/2012)   Opioid dependence with withdrawal ([place]) 08/30/2012   Renal insufficiency    Sickle cell anemia (HCC)    Sickle cell crisis ([place]) 09/25/2012   Sickle cell nephropathy ([place]) 07/10/2012   Sinus tachycardia    Tachycardia with heart rate 121-140 beats per minute with ambulation 08/04/2016   Past Surgical History:  Procedure Laterality Date   ABCESS DRAINAGE     CHOLECYSTECTOMY  ~ 2012   COLONOSCOPY N/A 04/23/2017   Procedure: COLONOSCOPY;  Surgeon: Irene Shipper, MD;  [facility];  Service: Endoscopy;  Laterality: N/A;   IR FLUORO GUIDE CV LINE RIGHT  12/17/2016   IR FLUORO GUIDE CV LINE RIGHT  02/07/2021   IR RADIOLOGIST EVAL & MGMT  10/02/2018   IR RADIOLOGIST EVAL & MGMT  10/15/2018   IR REMOVAL TUN CV CATH W/O FL  12/21/2016   IR REMOVAL TUN CV CATH W/O FL  02/11/2021   IR US GUIDE VASC ACCESS RIGHT  12/17/2016   IR US GUIDE VASC ACCESS RIGHT  02/07/2021   spleenectomy     Social History:  reports that he has quit smoking. His smoking use included cigarettes. He smoked an average of .1 packs per day. He has never used smokeless tobacco. He reports current alcohol use. He reports current drug use. Drug: Marijuana.  Allergies  Allergen Reactions   Nsaids Other (See Comments)    Kidney disease  Morphine And Related Other (See Comments)    "Real bad headaches"     Family History  Problem Relation Age of Onset   Breast cancer Mother     Prior to Admission medications   Medication Sig Start Date End Date Taking? Authorizing Provider  amitriptyline (ELAVIL) 25 MG tablet Take 50 mg by mouth at bedtime. 03/08/21  Yes [provider]  calcitRIOL (ROCALTROL) 0.25 MCG capsule Take 0.25 mcg by mouth daily with breakfast. 11/16/20  Yes [provider]  Deferiprone, Twice Daily, (FERRIPROX TWICE-A-DAY) 1000 MG TABS Take 1,000 mg by mouth 2 (two) times daily.   Yes [provider]  folic acid (FOLVITE) 1 MG tablet TAKE 1 TABLET BY MOUTH EVERY  DAY Patient taking differently: Take 1 mg by mouth every morning. 03/22/20  Yes [name], FNP  hydroxyurea (HYDREA) 500 MG capsule Take 500-1,000 mg by mouth See admin instructions. Take 2 capsules (1000 mg) by mouth on Tuesday and Thursday mornings, take 1 capsule (500 mg) on all other mornings 05/07/20  Yes [provider]  oxyCODONE (OXYCONTIN) 15 mg 12 hr tablet Take 1 tablet (15 mg total) by mouth every 12 (twelve) hours. 07/20/21  Yes [name], FNP  patiromer (VELTASSA) 8.4 g packet Take 8.4 g by mouth daily.   Yes [provider]  sodium bicarbonate 650 MG tablet Take 1,950 mg by mouth 3 (three) times daily with meals. 11/07/18  Yes [provider]  voxelotor (OXBRYTA) 500 MG TABS tablet Take 1,500 mg by mouth daily with breakfast.   Yes [provider]  Oxycodone HCl 10 MG TABS Take 1 tablet (10 mg total) by mouth every 6 (six) hours as needed. Patient not taking: Reported on 07/30/2021 07/20/21   [name], FNP    Physical Exam: Vitals:   07/30/21 0830 07/30/21 0900 07/30/21 0930 07/30/21 1000  BP: 122/89 (!) 147/85 (!) 133/93 (!) 133/99  Pulse: (!) 103 (!) 109  (!) 114  Resp: 13 18 17 19   Temp:      TempSrc:      SpO2: 98% 97%  96%  Weight:      Height:       Generally stable, no distress HEENT: PERRL, EOMI no pallor jaundice Neck: Supple, no JVD no lymphadenopathy Respiratory: Good air entry bilaterally no wheeze rales or crackles Cardiovascular system: Regular rate and rhythm Abdomen: Soft, nontender with positive bowel sounds Extremities: No edema sinus or clubbing Skin exam: No ulcers or rashes Musculoskeletal: Obvious swelling  Data Reviewed:  There are no new results to review at this time.  Assessment and Plan:  #1 sickle cell pain crisis: Patient will be admitted.  Initiate Dilaudid PCA, no Toradol due to chronic kidney disease, IV fluids, long-acting home regimen and assess pain.  #2 anemia of chronic  disease: Monitor H&H and continue home regimen  #3 polysubstance abuse: Counseling provided  #4 chronic kidney disease stage III: Monitor renal function     Advance Care Planning:   Code Status: Prior full code  Consults: None  Family Communication: Mother at bedside  Severity of Illness: The appropriate patient status for this patient is INPATIENT. Inpatient status is judged to be reasonable and necessary in order to provide the required intensity of service to ensure the patient's safety. The patient's presenting symptoms, physical exam findings, and initial radiographic and laboratory data in the context of their chronic comorbidities is felt to place them at high risk for further clinical deterioration. Furthermore,  it is not anticipated that the patient will be medically stable for discharge from the hospital within 2 midnights of admission.   * I certify that at the point of admission it is my clinical judgment that the patient will require inpatient hospital care spanning beyond 2 midnights from the point of admission due to high intensity of service, high risk for further deterioration and high frequency of surveillance required.*  AuthorBarbette Merino, MD 07/30/2021 11:39 AM  For on call review [email or web address].

## 2021-07-30 NOTE — ED Provider Notes (Signed)
[facility] DEPT Provider Note   CSN: [id number] Arrival date & time: 07/30/21  0355     History  Chief Complaint  Patient presents with   Sickle Cell Pain Crisis   Chest Pain    [name] [name]. is a 41 y.o. male.  HPI  He presents for evaluation of general achiness for several days associated with vomiting and unable to keep down any of his medications.  He denies fever, chills, diarrhea, dysuria.  He states his urine is dark.  He has been coughing but not producing sputum.  He is worried about exposure to black mold which has been found in his apartment.  Apparently contractors are going to work on getting it removed.  He was seen by his provider who manages his sickle cell disease, for a general checkup, 4 days ago.  At that time he had laboratory evaluations, which showed ongoing abnormalities with somewhat worsening renal insufficiency, calcium low, bilirubin high, transaminases high, ferritin high, vitamin D low, white count high, hemoglobin low.  At that time no interventions were taken  Home Medications Prior to Admission medications   Medication Sig Start Date End Date Taking? Authorizing Provider  acetaminophen (TYLENOL) 650 MG CR tablet Take 650 mg by mouth every 8 (eight) hours as needed for pain.    [provider]  amitriptyline (ELAVIL) 25 MG tablet Take 50 mg by mouth at bedtime. 03/08/21   [provider]  calcitRIOL (ROCALTROL) 0.25 MCG capsule Take 0.25 mcg by mouth daily. 11/16/20   [provider]  Deferiprone, Twice Daily, (FERRIPROX TWICE-A-DAY) 1000 MG TABS Take 1,000 mg by mouth 2 (two) times daily.    [provider]  folic acid (FOLVITE) 1 MG tablet TAKE 1 TABLET BY MOUTH EVERY DAY Patient taking differently: Take 1 mg by mouth daily. 03/22/20   [name], FNP  hydroxyurea (HYDREA) 500 MG capsule Take 500 mg by mouth daily. 05/07/20   [provider]  OXBRYTA 500 MG TABS  tablet Take 1,500 mg by mouth daily. 03/26/20   [provider]  oxyCODONE (OXYCONTIN) 15 mg 12 hr tablet Take 1 tablet (15 mg total) by mouth every 12 (twelve) hours. 07/20/21   [name], FNP  Oxycodone HCl 10 MG TABS Take 1 tablet (10 mg total) by mouth every 6 (six) hours as needed. 07/20/21   [name], FNP  sodium bicarbonate 650 MG tablet Take 1,950 mg by mouth 3 (three) times daily. 11/07/18   [provider]  VELTASSA 8.4 g packet Take 1 packet by mouth daily. 05/23/21   [provider]      Allergies    Nsaids and Morphine and related    Review of Systems   Review of Systems  Physical Exam Updated Vital Signs BP (!) 147/85   Pulse (!) 109   Temp 98.8 F (37.1 C) (Oral)   Resp 18   Ht 5' 3" (1.6 m)   Wt 57.2 kg   SpO2 97%   BMI 22.32 kg/m  Physical Exam Vitals and nursing note reviewed.  Constitutional:      General: He is not in acute distress.    Appearance: He is well-developed. He is not ill-appearing, toxic-appearing or diaphoretic.  HENT:     Head: Normocephalic and atraumatic.     Right Ear: External ear normal.     Left Ear: External ear normal.  Eyes:     Conjunctiva/sclera: Conjunctivae normal.  Pupils: Pupils are equal, round, and reactive to light.  Neck:     Trachea: Phonation normal.  Cardiovascular:     Rate and Rhythm: Normal rate and regular rhythm.     Heart sounds: Normal heart sounds.  Pulmonary:     Effort: Pulmonary effort is normal. No respiratory distress.     Breath sounds: Normal breath sounds. No stridor.  Abdominal:     General: There is no distension.     Palpations: Abdomen is soft.     Tenderness: There is no abdominal tenderness.  Musculoskeletal:        General: Normal range of motion.     Cervical back: Normal range of motion and neck supple. No tenderness.  Skin:    General: Skin is warm and dry.  Neurological:     Mental Status: He is alert and oriented to person, place, and  time.     Cranial Nerves: No cranial nerve deficit.     Sensory: No sensory deficit.     Motor: No abnormal muscle tone.     Coordination: Coordination normal.  Psychiatric:        Mood and Affect: Mood normal.        Behavior: Behavior normal.        Thought Content: Thought content normal.        Judgment: Judgment normal.     ED Results / Procedures / Treatments   Labs (all labs ordered are listed, but only abnormal results are displayed) Labs Reviewed  CBC WITH DIFFERENTIAL/PLATELET - Abnormal; Notable for the following components:      Result Value   WBC 22.5 (*)    RBC 2.20 (*)    Hemoglobin 8.2 (*)    HCT 23.3 (*)    MCV 105.9 (*)    MCH 37.3 (*)    RDW 25.7 (*)    nRBC 0.3 (*)    Neutro Abs 10.4 (*)    Lymphs Abs 10.0 (*)    Monocytes Absolute 1.5 (*)    Abs Immature Granulocytes 0.10 (*)    All other components within normal limits  RETICULOCYTES - Abnormal; Notable for the following components:   Retic Ct Pct 17.2 (*)    RBC. 1.98 (*)    Retic Count, Absolute 341.1 (*)    Immature Retic Fract 26.1 (*)    All other components within normal limits  COMPREHENSIVE METABOLIC PANEL - Abnormal; Notable for the following components:   BUN 44 (*)    Creatinine, Ser 3.72 (*)    Calcium 8.8 (*)    AST 54 (*)    Alkaline Phosphatase 176 (*)    Total Bilirubin 1.5 (*)    GFR, Estimated 20 (*)    All other components within normal limits  URINALYSIS, ROUTINE W REFLEX MICROSCOPIC - Abnormal; Notable for the following components:   Color, Urine AMBER (*)    Hgb urine dipstick MODERATE (*)    Protein, ur 100 (*)    All other components within normal limits  RAPID URINE DRUG SCREEN, HOSP PERFORMED    EKG EKG Interpretation  Date/Time:  Saturday July 30 2021 07:32:53 EDT Ventricular Rate:  107 PR Interval:  164 QRS Duration: 82 QT Interval:  328 QTC Calculation: 438 R Axis:   63 Text Interpretation: Sinus tachycardia Probable left atrial enlargement Borderline  T wave abnormalities Since last tracing of earlier today No significant change was found Confirmed by ,  ([id number]) on 07/30/2021 7:52:20 AM  Radiology DG Chest   2 View  Result Date: 07/30/2021 CLINICAL DATA:  41 year old male with history of cough. Sickle cell disease. EXAM: CHEST - 2 VIEW COMPARISON:  Chest x-ray 06/23/2021. FINDINGS: Mild diffuse interstitial prominence most evident throughout the mid to lower lungs bilaterally, stable compared to prior studies, likely reflective of areas of chronic scarring in this patient with history of sickle cell disease. No acute consolidative airspace disease. No pleural effusions. No pneumothorax. No evidence of pulmonary edema. Heart size is normal. Upper mediastinal contours are within normal limits. Advanced avascular necrosis in the right humeral head, with multiple additional areas of bony sclerosis, presumably related to additional areas of avascular necrosis. IMPRESSION: 1. Chronic lung changes without radiographic evidence of acute cardiopulmonary disease, as above. 1. Electronically Signed   By: Vinnie Langton M.D.   On: 07/30/2021 09:09    Procedures Procedures    Medications Ordered in ED Medications  0.45 % sodium chloride infusion ( Intravenous New Bag/Given 07/30/21 0909)  HYDROmorphone (DILAUDID) injection 2 mg (has no administration in time range)  ondansetron (ZOFRAN) injection 4 mg (4 mg Intravenous Given 07/30/21 0824)  HYDROmorphone (DILAUDID) injection 2 mg (2 mg Subcutaneous Given 07/30/21 0903)  HYDROmorphone (DILAUDID) injection 2 mg (2 mg Subcutaneous Given 07/30/21 0928)  diphenhydrAMINE (BENADRYL) 12.5 mg in sodium chloride 0.9 % 50 mL IVPB (0 mg Intravenous Stopped 07/30/21 0905)  sodium chloride 0.9 % bolus 500 mL (0 mLs Intravenous Stopped 07/30/21 0900)    ED Course/ Medical Decision Making/ A&P Clinical Course as of 07/30/21 1010  Sat Jul 30, 2021  1006 Patient has not had any improvement with initial treatment.   He is requesting hospitalization for PCA pump treatment.  Mother with patient now is disappointed that he has not been receiving IV Dilaudid.  I explained that this was given by protocol.  We will proceed with admission at this time. [EW]    Clinical Course User Index [EW] Daleen Bo, MD                           Medical Decision Making Patient presenting for evaluation of generalized pain, with sickle cell anemia and like she is in crisis.  Recent laboratory test done as outpatient, indicate chronic renal insufficiency somewhat worse than baseline.  He has presenting complaint of vomiting and not tolerating medications.  He has a relatively high MME in the mid 71s.  No other significant infectious symptoms.  Problems Addressed: AKI (acute kidney injury) [facility]):    Details: Acute on chronic Nausea and vomiting, unspecified vomiting type: acute illness or injury    Details: Nonspecific etiology Sickle cell crisis [facility]): acute illness or injury    Details: Failed emergency department treatment for stabilization  Amount and/or Complexity of Data Reviewed Independent Historian:     Details: Independent historian, supplemented by mother at bedside Labs: ordered.    Details: CBC, metabolic panel, reticulocyte count, urinalysis, urine drug screen-normal except white count high, hemoglobin low, reticulocyte count high, BUN high, creatinine high, calcium low, AST high, alk phos stays high, total bilirubin high, GFR low, urine with blood but no other infectious signs. Radiology: ordered and independent interpretation performed.    Details: Chest x-ray-no infiltrate or edema ECG/medicine tests: ordered and independent interpretation performed.    Details: Cardiac monitor-normal sinus rhythm Discussion of management or test interpretation with external provider(s): Consult hospitalist for admission.  Risk Prescription drug management. Decision regarding hospitalization. Risk Details: Patient  with sickle cell anemia  presenting with vomiting, recently found to have acute on chronic renal insufficiency, presenting with generalized pain but no other localizing symptoms.  Patient was treated with IV fluids and subcutaneous Dilaudid.  He and his mother were disappointed about the method of administration of the narcotic.  He request hospitalization.  His pain has not been controlled but he did not vomit in the ED after treatment.  IV was obtained by nursing, to administer fluids and some medications.  Hospitalist consulted to arrange for admission for further treatment and stabilization.           Final Clinical Impression(s) / ED Diagnoses Final diagnoses:  Sickle cell crisis (HCC)  Nausea and vomiting, unspecified vomiting type  AKI (acute kidney injury) (HCC)    Rx / DC Orders ED Discharge Orders     None         , , MD 07/30/21 1035  

## 2021-07-31 ENCOUNTER — Encounter (HOSPITAL_COMMUNITY): Payer: Self-pay | Admitting: Internal Medicine

## 2021-07-31 DIAGNOSIS — D5700 Hb-SS disease with crisis, unspecified: Secondary | ICD-10-CM | POA: Diagnosis not present

## 2021-07-31 LAB — COMPREHENSIVE METABOLIC PANEL
ALT: 30 U/L (ref 0–44)
AST: 37 U/L (ref 15–41)
Albumin: 3.2 g/dL — ABNORMAL LOW (ref 3.5–5.0)
Alkaline Phosphatase: 168 U/L — ABNORMAL HIGH (ref 38–126)
Anion gap: 8 (ref 5–15)
BUN: 30 mg/dL — ABNORMAL HIGH (ref 6–20)
CO2: 26 mmol/L (ref 22–32)
Calcium: 8.6 mg/dL — ABNORMAL LOW (ref 8.9–10.3)
Chloride: 106 mmol/L (ref 98–111)
Creatinine, Ser: 3.16 mg/dL — ABNORMAL HIGH (ref 0.61–1.24)
GFR, Estimated: 25 mL/min — ABNORMAL LOW (ref >60)
Glucose, Bld: 107 mg/dL — ABNORMAL HIGH (ref 70–99)
Potassium: 4.2 mmol/L (ref 3.5–5.1)
Sodium: 140 mmol/L (ref 135–145)
Total Bilirubin: 1.2 mg/dL (ref 0.3–1.2)
Total Protein: 6.5 g/dL (ref 6.5–8.1)

## 2021-07-31 LAB — CBC WITH DIFFERENTIAL/PLATELET
Abs Immature Granulocytes: 0.11 10*3/uL — ABNORMAL HIGH (ref 0.00–0.07)
Basophils Absolute: 0.1 10*3/uL (ref 0.0–0.1)
Basophils Relative: 0 %
Eosinophils Absolute: 0.5 10*3/uL (ref 0.0–0.5)
Eosinophils Relative: 2 %
HCT: 19.4 % — ABNORMAL LOW (ref 39.0–52.0)
Hemoglobin: 6.5 g/dL — CL (ref 13.0–17.0)
Immature Granulocytes: 1 %
Lymphocytes Relative: 51 %
Lymphs Abs: 11.8 10*3/uL — ABNORMAL HIGH (ref 0.7–4.0)
MCH: 36.5 pg — ABNORMAL HIGH (ref 26.0–34.0)
MCHC: 33.5 g/dL (ref 30.0–36.0)
MCV: 109 fL — ABNORMAL HIGH (ref 80.0–100.0)
Monocytes Absolute: 1.5 10*3/uL — ABNORMAL HIGH (ref 0.1–1.0)
Monocytes Relative: 7 %
Neutro Abs: 8.8 10*3/uL — ABNORMAL HIGH (ref 1.7–7.7)
Neutrophils Relative %: 39 %
Platelets: 290 10*3/uL (ref 150–400)
RBC: 1.78 MIL/uL — ABNORMAL LOW (ref 4.22–5.81)
RDW: 25.6 % — ABNORMAL HIGH (ref 11.5–15.5)
WBC: 22.8 10*3/uL — ABNORMAL HIGH (ref 4.0–10.5)
nRBC: 0.1 % (ref 0.0–0.2)

## 2021-07-31 NOTE — Progress Notes (Signed)
SICKLE CELL SERVICE PROGRESS NOTE  [name] [name]. EQA:[id number] DOB: 1980-07-17 DOA: 07/30/2021 PCP: [name], FNP  Assessment/Plan: Principal Problem:   Sickle cell pain crisis ([place]) Active Problems:   CKD (chronic kidney disease), stage IV (HCC)   Acute renal failure superimposed on chronic kidney disease (HCC)   GERD (gastroesophageal reflux disease)   Chronic pain syndrome  Sickle cell pain crisis: Patient still complaining of 8 out of 10 ongoing pain.  Continues Dilaudid PCA as well as his OxyContin with no Toradol.  IV fluids and supportive care. Acute on chronic kidney disease stage IV: Creatinine is slightly improved coming down to 3.16 today.  Patient's baseline is 2.7.  Continue monitoring.  Recheck labs this morning. Leukocytosis: No improvement.  Monitor white count. Anemia of chronic disease: Acute on chronic anemia: Hemoglobin is down to 6.5 from 8.2.  His baseline is between 6.5-7.5.  It was 8.2 yesterday.  Patient may likely require transfusion of at least 1 unit of packed red blood cells.  We will recheck in the morning and if there is no improvement we will transfuse. Polysubstance abuse: Counseling given. Persistent nausea and vomiting: Patient complained of persistent vomiting.  No evidence of obstruction.  Not sure what caused the vomiting.  Currently on Compazine.  Also Zofran.  We will follow  Code Status: Full code Family Communication: No family at bedside Disposition Plan: Home when ready  [facility]  Pager [phone]. If 7PM-7AM, please contact night-coverage.  07/31/2021, 5:52 PM  LOS: 1 day   Brief narrative: [name] [name]. is a 41 y.o. male with medical history significant of sickle cell disease, anemia of chronic disease, polysubstance abuse, chronic kidney disease stage IV, who presents to the ER with complaint of pain in his back and legs.  Pain is consistent with his typical sickle cell crisis.  Patient has taken his home  regimen but no relief.  He also complained of some members of his family have been sick.  He was seen in the ER and evaluated.  Received 6 mg of subcutaneous Dilaudid with no relief in his pain.  Patient is subsequently being admitted to the hospital for sickle cell pain management.  Patient is a frequent flyer with recurrent crisis.  Consultants: None  Procedures: Chest x-ray  Antibiotics: None  HPI/Subjective: Patient is complaining of significant pain.  Still at 8 out of 10. Also persistent nausea and vomiting.  Some abdominal pain.    No fever no chills.    Objective: Vitals:   07/31/21 1202 07/31/21 1324 07/31/21 1548 07/31/21 1731  BP:  127/81  129/87  Pulse:  97  (!) 103  Resp: 13 14 13 14   Temp:  97.9 F (36.6 C)  98.2 F (36.8 C)  TempSrc:  Oral  Oral  SpO2: 96% 97% 93% 98%  Weight:      Height:       Weight change:   Intake/Output Summary (Last 24 hours) at 07/31/2021 1752 Last data filed at 07/31/2021 1557 Gross per 24 hour  Intake 2270.25 ml  Output 2301 ml  Net -30.75 ml     General: Alert, awake, oriented x3, in no acute distress.  HEENT: [place]/AT PEERL, EOMI Neck: Trachea midline,  no masses, no thyromegal,y no JVD, no carotid bruit OROPHARYNX:  Moist, No exudate/ erythema/lesions.  Heart: Regular rate and rhythm, without murmurs, rubs, gallops, PMI non-displaced, no heaves or thrills on palpation.  Lungs: Clear to auscultation, no wheezing or rhonchi noted. No  increased vocal fremitus resonant to percussion  Abdomen: Soft, nontender, nondistended, positive bowel sounds, no masses no hepatosplenomegaly noted..  Neuro: No focal neurological deficits noted cranial nerves II through XII grossly intact. DTRs 2+ bilaterally upper and lower extremities. Strength 5 out of 5 in bilateral upper and lower extremities. Musculoskeletal: No warm swelling or erythema around joints, no spinal tenderness noted. Psychiatric: Patient alert and oriented x3, good insight and  cognition, good recent to remote recall. Lymph node survey: No cervical axillary or inguinal lymphadenopathy noted.   Data Reviewed: Basic Metabolic Panel: Recent Labs  Lab 07/26/21 1047 07/30/21 0747 07/31/21 1037  NA 133* 137 140  K 5.0 4.0 4.2  CL 97 102 106  CO2 15* 24 26  GLUCOSE 88 93 107*  BUN 57* 44* 30*  CREATININE 3.66* 3.72* 3.16*  CALCIUM 8.4* 8.8* 8.6*    Liver Function Tests: Recent Labs  Lab 07/26/21 1047 07/30/21 0747 07/31/21 1037  AST 92* 54* 37  ALT 54* 40 30  ALKPHOS 260* 176* 168*  BILITOT 1.5* 1.5* 1.2  PROT 7.0 7.9 6.5  ALBUMIN 4.2 3.9 3.2*    No results for input(s): "LIPASE", "AMYLASE" in the last 168 hours. No results for input(s): "AMMONIA" in the last 168 hours. CBC: Recent Labs  Lab 07/26/21 1047 07/30/21 0747 07/31/21 1037  WBC 21.5* 22.5* 22.8*  NEUTROABS 10.1* 10.4* 8.8*  HGB 6.6* 8.2* 6.5*  HCT 18.0* 23.3* 19.4*  MCV 100* 105.9* 109.0*  PLT 244 319 290    Cardiac Enzymes: No results for input(s): "CKTOTAL", "CKMB", "CKMBINDEX", "TROPONINI" in the last 168 hours. BNP (last 3 results) No results for input(s): "BNP" in the last 8760 hours.  ProBNP (last 3 results) No results for input(s): "PROBNP" in the last 8760 hours.  CBG: No results for input(s): "GLUCAP" in the last 168 hours.  No results found for this or any previous visit (from the past 240 hour(s)).   Studies: DG Chest 2 View  Result Date: 07/30/2021 CLINICAL DATA:  41 year old male with history of cough. Sickle cell disease. EXAM: CHEST - 2 VIEW COMPARISON:  Chest x-ray 06/23/2021. FINDINGS: Mild diffuse interstitial prominence most evident throughout the mid to lower lungs bilaterally, stable compared to prior studies, likely reflective of areas of chronic scarring in this patient with history of sickle cell disease. No acute consolidative airspace disease. No pleural effusions. No pneumothorax. No evidence of pulmonary edema. Heart size is normal. Upper  mediastinal contours are within normal limits. Advanced avascular necrosis in the right humeral head, with multiple additional areas of bony sclerosis, presumably related to additional areas of avascular necrosis. IMPRESSION: 1. Chronic lung changes without radiographic evidence of acute cardiopulmonary disease, as above. 1. Electronically Signed   By: Vinnie Langton M.D.   On: 07/30/2021 09:09   DG Foot Complete Left  Result Date: 07/17/2021 CLINICAL DATA:  foot/ankle swelling, sickle cell EXAM: LEFT ANKLE COMPLETE - 3+ VIEW; LEFT FOOT - COMPLETE 3+ VIEW COMPARISON:  December 11, 2015. FINDINGS: Osteopenia. There is serpiginous sclerosis throughout the bones of the foot, most pronounced in the distal tibia and head of the first metatarsal. Severe joint space narrowing of the first MTP. Likely pes planus. No acute fracture or dislocation. Smooth cortical thickening of the posterior aspect of the tibia, likely due to chronic reactive stress changes, mildly increased in comparison to more remote prior. IMPRESSION: 1. No acute fracture or dislocation. 2. Multifocal serpiginous sclerosis throughout the ankle and foot consistent with sequela of multiple bone  infarctions/sickle cell disease. 3. Progressive joint space narrowing of the first MTP, likely due to avascular necrosis and metatarsal head collapse. 4. Mild increase in smooth cortical thickening along the posterior aspect of the tibia since 2017. This likely is due to chronic repetitive stress changes. If further imaging is desired, recommend dedicated MRI. Electronically Signed   By: Valentino Saxon M.D.   On: 07/17/2021 21:01   DG Ankle Complete Left  Result Date: 07/17/2021 CLINICAL DATA:  foot/ankle swelling, sickle cell EXAM: LEFT ANKLE COMPLETE - 3+ VIEW; LEFT FOOT - COMPLETE 3+ VIEW COMPARISON:  December 11, 2015. FINDINGS: Osteopenia. There is serpiginous sclerosis throughout the bones of the foot, most pronounced in the distal tibia and head of  the first metatarsal. Severe joint space narrowing of the first MTP. Likely pes planus. No acute fracture or dislocation. Smooth cortical thickening of the posterior aspect of the tibia, likely due to chronic reactive stress changes, mildly increased in comparison to more remote prior. IMPRESSION: 1. No acute fracture or dislocation. 2. Multifocal serpiginous sclerosis throughout the ankle and foot consistent with sequela of multiple bone infarctions/sickle cell disease. 3. Progressive joint space narrowing of the first MTP, likely due to avascular necrosis and metatarsal head collapse. 4. Mild increase in smooth cortical thickening along the posterior aspect of the tibia since 2017. This likely is due to chronic repetitive stress changes. If further imaging is desired, recommend dedicated MRI. Electronically Signed   By: Valentino Saxon M.D.   On: 07/17/2021 21:01    Scheduled Meds:  amitriptyline  50 mg Oral QHS   calcitRIOL  0.25 mcg Oral Q breakfast   Deferiprone (Twice Daily)  1,000 mg Oral BID   enoxaparin (LOVENOX) injection  30 mg Subcutaneous Q59D   folic acid  1 mg Oral q morning   HYDROmorphone   Intravenous Q4H   hydroxyurea  500 mg Oral Once per day on Sun Mon Wed Fri Sat   And   [START ON 08/02/2021] hydroxyurea  1,000 mg Oral Once per day on Tue Thu   oxyCODONE  15 mg Oral Q12H   patiromer  8.4 g Oral Daily   senna-docusate  1 tablet Oral BID   sodium bicarbonate  1,950 mg Oral TID WC   voxelotor  1,500 mg Oral Q breakfast   Continuous Infusions:  sodium chloride 150 mL/hr at 07/31/21 1557   dextrose 5 % and 0.45% NaCl 100 mL/hr at 07/31/21 1557    Principal Problem:   Sickle cell pain crisis ([place]) Active Problems:   CKD (chronic kidney disease), stage IV (HCC)   Acute renal failure superimposed on chronic kidney disease (HCC)   GERD (gastroesophageal reflux disease)   Chronic pain syndrome

## 2021-07-31 NOTE — Progress Notes (Signed)
Critical Lab Value HGB: 6.5  Time Critical Called: 11:56 am Dr. Ree Kida: Dr. Jonelle Sidle Time Notified: 11:58 Order Provided: None Given

## 2021-07-31 NOTE — Progress Notes (Signed)
SICKLE CELL SERVICE PROGRESS NOTE  [name] [name]. IWP:[id number] DOB: Nov 28, 1980 DOA: 07/30/2021 PCP: [name], FNP  Assessment/Plan: Principal Problem:   Sickle cell pain crisis ([place]) Active Problems:   CKD (chronic kidney disease), stage IV (HCC)   Acute renal failure superimposed on chronic kidney disease (HCC)   GERD (gastroesophageal reflux disease)   Chronic pain syndrome  Sickle cell pain crisis: Patient still complaining of ongoing pain.  Continues Dilaudid PCA.  No Toradol.  IV fluids and supportive care. Acute on chronic kidney disease stage IV: Creatinine is getting worse from 3.66-3.72.  Patient's baseline is 2.7.  Continue monitoring.  Recheck labs this morning. Leukocytosis: Monitor white count. Anemia of chronic disease: Recheck H&H today Polysubstance abuse: Counseling given.  Code Status: Full code Family Communication: No family at bedside Disposition Plan: Home when ready  [facility]  Pager [phone]. If 7PM-7AM, please contact night-coverage.  07/31/2021, 10:05 AM  LOS: 1 day   Brief narrative: [name] Franchini. is a 41 y.o. male with medical history significant of sickle cell disease, anemia of chronic disease, polysubstance abuse, chronic kidney disease stage IV, who presents to the ER with complaint of pain in his back and legs.  Pain is consistent with his typical sickle cell crisis.  Patient has taken his home regimen but no relief.  He also complained of some members of his family have been sick.  He was seen in the ER and evaluated.  Received 6 mg of subcutaneous Dilaudid with no relief in his pain.  Patient is subsequently being admitted to the hospital for sickle cell pain management.  Patient is a frequent flyer with recurrent crisis.  Consultants: None  Procedures: Chest x-ray  Antibiotics: None  HPI/Subjective: Patient is complaining of significant pain.  Still at 8 out of 10.  No fever no chills.  Awaiting labs for  this morning.  Objective: Vitals:   07/31/21 0040 07/31/21 0451 07/31/21 0725 07/31/21 0956  BP:    111/73  Pulse:    99  Resp: 16 16 13 14   Temp:    97.9 F (36.6 C)  TempSrc:    Oral  SpO2: 93% 94% 97% 97%  Weight:      Height:       Weight change:   Intake/Output Summary (Last 24 hours) at 07/31/2021 1005 Last data filed at 07/31/2021 1937 Gross per 24 hour  Intake 2492.84 ml  Output 2600 ml  Net -107.16 ml    General: Alert, awake, oriented x3, in no acute distress.  HEENT: [place]/AT PEERL, EOMI Neck: Trachea midline,  no masses, no thyromegal,y no JVD, no carotid bruit OROPHARYNX:  Moist, No exudate/ erythema/lesions.  Heart: Regular rate and rhythm, without murmurs, rubs, gallops, PMI non-displaced, no heaves or thrills on palpation.  Lungs: Clear to auscultation, no wheezing or rhonchi noted. No increased vocal fremitus resonant to percussion  Abdomen: Soft, nontender, nondistended, positive bowel sounds, no masses no hepatosplenomegaly noted..  Neuro: No focal neurological deficits noted cranial nerves II through XII grossly intact. DTRs 2+ bilaterally upper and lower extremities. Strength 5 out of 5 in bilateral upper and lower extremities. Musculoskeletal: No warm swelling or erythema around joints, no spinal tenderness noted. Psychiatric: Patient alert and oriented x3, good insight and cognition, good recent to remote recall. Lymph node survey: No cervical axillary or inguinal lymphadenopathy noted.   Data Reviewed: Basic Metabolic Panel: Recent Labs  Lab 07/26/21 1047 07/30/21 0747  NA 133* 137  K 5.0  4.0  CL 97 102  CO2 15* 24  GLUCOSE 88 93  BUN 57* 44*  CREATININE 3.66* 3.72*  CALCIUM 8.4* 8.8*   Liver Function Tests: Recent Labs  Lab 07/26/21 1047 07/30/21 0747  AST 92* 54*  ALT 54* 40  ALKPHOS 260* 176*  BILITOT 1.5* 1.5*  PROT 7.0 7.9  ALBUMIN 4.2 3.9   No results for input(s): "LIPASE", "AMYLASE" in the last 168 hours. No results for  input(s): "AMMONIA" in the last 168 hours. CBC: Recent Labs  Lab 07/26/21 1047 07/30/21 0747  WBC 21.5* 22.5*  NEUTROABS 10.1* 10.4*  HGB 6.6* 8.2*  HCT 18.0* 23.3*  MCV 100* 105.9*  PLT 244 319   Cardiac Enzymes: No results for input(s): "CKTOTAL", "CKMB", "CKMBINDEX", "TROPONINI" in the last 168 hours. BNP (last 3 results) No results for input(s): "BNP" in the last 8760 hours.  ProBNP (last 3 results) No results for input(s): "PROBNP" in the last 8760 hours.  CBG: No results for input(s): "GLUCAP" in the last 168 hours.  No results found for this or any previous visit (from the past 240 hour(s)).   Studies: DG Chest 2 View  Result Date: 07/30/2021 CLINICAL DATA:  41 year old male with history of cough. Sickle cell disease. EXAM: CHEST - 2 VIEW COMPARISON:  Chest x-ray 06/23/2021. FINDINGS: Mild diffuse interstitial prominence most evident throughout the mid to lower lungs bilaterally, stable compared to prior studies, likely reflective of areas of chronic scarring in this patient with history of sickle cell disease. No acute consolidative airspace disease. No pleural effusions. No pneumothorax. No evidence of pulmonary edema. Heart size is normal. Upper mediastinal contours are within normal limits. Advanced avascular necrosis in the right humeral head, with multiple additional areas of bony sclerosis, presumably related to additional areas of avascular necrosis. IMPRESSION: 1. Chronic lung changes without radiographic evidence of acute cardiopulmonary disease, as above. 1. Electronically Signed   By: Vinnie Langton M.D.   On: 07/30/2021 09:09   DG Foot Complete Left  Result Date: 07/17/2021 CLINICAL DATA:  foot/ankle swelling, sickle cell EXAM: LEFT ANKLE COMPLETE - 3+ VIEW; LEFT FOOT - COMPLETE 3+ VIEW COMPARISON:  December 11, 2015. FINDINGS: Osteopenia. There is serpiginous sclerosis throughout the bones of the foot, most pronounced in the distal tibia and head of the first  metatarsal. Severe joint space narrowing of the first MTP. Likely pes planus. No acute fracture or dislocation. Smooth cortical thickening of the posterior aspect of the tibia, likely due to chronic reactive stress changes, mildly increased in comparison to more remote prior. IMPRESSION: 1. No acute fracture or dislocation. 2. Multifocal serpiginous sclerosis throughout the ankle and foot consistent with sequela of multiple bone infarctions/sickle cell disease. 3. Progressive joint space narrowing of the first MTP, likely due to avascular necrosis and metatarsal head collapse. 4. Mild increase in smooth cortical thickening along the posterior aspect of the tibia since 2017. This likely is due to chronic repetitive stress changes. If further imaging is desired, recommend dedicated MRI. Electronically Signed   By: Valentino Saxon M.D.   On: 07/17/2021 21:01   DG Ankle Complete Left  Result Date: 07/17/2021 CLINICAL DATA:  foot/ankle swelling, sickle cell EXAM: LEFT ANKLE COMPLETE - 3+ VIEW; LEFT FOOT - COMPLETE 3+ VIEW COMPARISON:  December 11, 2015. FINDINGS: Osteopenia. There is serpiginous sclerosis throughout the bones of the foot, most pronounced in the distal tibia and head of the first metatarsal. Severe joint space narrowing of the first MTP. Likely pes planus. No  acute fracture or dislocation. Smooth cortical thickening of the posterior aspect of the tibia, likely due to chronic reactive stress changes, mildly increased in comparison to more remote prior. IMPRESSION: 1. No acute fracture or dislocation. 2. Multifocal serpiginous sclerosis throughout the ankle and foot consistent with sequela of multiple bone infarctions/sickle cell disease. 3. Progressive joint space narrowing of the first MTP, likely due to avascular necrosis and metatarsal head collapse. 4. Mild increase in smooth cortical thickening along the posterior aspect of the tibia since 2017. This likely is due to chronic repetitive stress  changes. If further imaging is desired, recommend dedicated MRI. Electronically Signed   By: Valentino Saxon M.D.   On: 07/17/2021 21:01    Scheduled Meds:  amitriptyline  50 mg Oral QHS   calcitRIOL  0.25 mcg Oral Q breakfast   Deferiprone (Twice Daily)  1,000 mg Oral BID   enoxaparin (LOVENOX) injection  30 mg Subcutaneous J49F   folic acid  1 mg Oral q morning   HYDROmorphone   Intravenous Q4H   hydroxyurea  500 mg Oral Once per day on Sun Mon Wed Fri Sat   And   [START ON 08/02/2021] hydroxyurea  1,000 mg Oral Once per day on Tue Thu   oxyCODONE  15 mg Oral Q12H   patiromer  8.4 g Oral Daily   senna-docusate  1 tablet Oral BID   sodium bicarbonate  1,950 mg Oral TID WC   voxelotor  1,500 mg Oral Q breakfast   Continuous Infusions:  sodium chloride 150 mL/hr at 07/31/21 0358   dextrose 5 % and 0.45% NaCl 100 mL/hr at 07/31/21 0358    Principal Problem:   Sickle cell pain crisis ([place]) Active Problems:   CKD (chronic kidney disease), stage IV (HCC)   Acute renal failure superimposed on chronic kidney disease (HCC)   GERD (gastroesophageal reflux disease)   Chronic pain syndrome

## 2021-08-01 DIAGNOSIS — D5700 Hb-SS disease with crisis, unspecified: Secondary | ICD-10-CM | POA: Diagnosis not present

## 2021-08-01 LAB — CBC
HCT: 19.8 % — ABNORMAL LOW (ref 39.0–52.0)
Hemoglobin: 7 g/dL — ABNORMAL LOW (ref 13.0–17.0)
MCH: 38.5 pg — ABNORMAL HIGH (ref 26.0–34.0)
MCHC: 35.4 g/dL (ref 30.0–36.0)
MCV: 108.8 fL — ABNORMAL HIGH (ref 80.0–100.0)
Platelets: 302 10*3/uL (ref 150–400)
RBC: 1.82 MIL/uL — ABNORMAL LOW (ref 4.22–5.81)
RDW: 25.5 % — ABNORMAL HIGH (ref 11.5–15.5)
WBC: 19.9 10*3/uL — ABNORMAL HIGH (ref 4.0–10.5)
nRBC: 0.2 % (ref 0.0–0.2)

## 2021-08-01 LAB — BASIC METABOLIC PANEL
Anion gap: 9 (ref 5–15)
BUN: 23 mg/dL — ABNORMAL HIGH (ref 6–20)
CO2: 23 mmol/L (ref 22–32)
Calcium: 8.4 mg/dL — ABNORMAL LOW (ref 8.9–10.3)
Chloride: 106 mmol/L (ref 98–111)
Creatinine, Ser: 2.61 mg/dL — ABNORMAL HIGH (ref 0.61–1.24)
GFR, Estimated: 31 mL/min — ABNORMAL LOW (ref >60)
Glucose, Bld: 94 mg/dL (ref 70–99)
Potassium: 4.7 mmol/L (ref 3.5–5.1)
Sodium: 138 mmol/L (ref 135–145)

## 2021-08-01 MED ORDER — OXYCODONE HCL 5 MG PO TABS: 10.0000 mg | ORAL_TABLET | ORAL | Status: DC | PRN

## 2021-08-01 MED ORDER — HYDROMORPHONE 1 MG/ML IV SOLN
INTRAVENOUS | Status: DC
  Administered 2021-08-01: 3 mg via INTRAVENOUS
  Administered 2021-08-02: 4.5 mg via INTRAVENOUS
  Administered 2021-08-02: 6.5 mg via INTRAVENOUS
  Administered 2021-08-02: 30 mg via INTRAVENOUS
  Administered 2021-08-02: 1 mg via INTRAVENOUS
  Administered 2021-08-02: 3.5 mg via INTRAVENOUS
  Filled 2021-08-01: qty 30

## 2021-08-01 NOTE — Progress Notes (Signed)
Pt has refused cardiac monitoring , taken all leads  off He states he does not need it . [place] Hollis ,NP notified
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
## 2021-08-01 NOTE — Progress Notes (Signed)
Subjective: [name] [name] is a 41 year old male with a medical history significant for sickle cell disease type SS, chronic pain syndrome, opiate dependence and tolerance, anemia of chronic disease, stage IV chronic kidney disease, and history of polysubstance abuse was admitted for sickle cell pain crisis. Today, patient continues to complain of low back pain.  He rates his pain as 6/10. He denies any headache, chest pain, urinary symptoms, nausea, vomiting, or diarrhea. Objective:  Vital signs in last 24 hours:  Vitals:   08/01/21 0812 08/01/21 0926 08/01/21 1200 08/01/21 1405  BP:  133/90  124/86  Pulse:  86  100  Resp: 14 13 16 12   Temp:  98.2 F (36.8 C)  98.2 F (36.8 C)  TempSrc:  Oral  Oral  SpO2: 97% 100% 98% 98%  Weight:      Height:        Intake/Output from previous day:   Intake/Output Summary (Last 24 hours) at 08/01/2021 1505 Last data filed at 08/01/2021 1409 Gross per 24 hour  Intake 2880.3 ml  Output 3450 ml  Net -569.7 ml    Physical Exam: General: Alert, awake, oriented x3, in no acute distress.  HEENT: [name] [name]/AT PEERL, EOMI Neck: Trachea midline,  no masses, no thyromegal,y no JVD, no carotid bruit OROPHARYNX:  Moist, No exudate/ erythema/lesions.  Heart: Regular rate and rhythm, without murmurs, rubs, gallops, PMI non-displaced, no heaves or thrills on palpation.  Lungs: Clear to auscultation, no wheezing or rhonchi noted. No increased vocal fremitus resonant to percussion  Abdomen: Soft, nontender, nondistended, positive bowel sounds, no masses no hepatosplenomegaly noted..  Neuro: No focal neurological deficits noted cranial nerves II through XII grossly intact. DTRs 2+ bilaterally upper and lower extremities. Strength 5 out of 5 in bilateral upper and lower extremities. Musculoskeletal: No warm swelling or erythema around joints, no spinal tenderness noted. Psychiatric: Patient alert and oriented x3, good insight and cognition, good recent to remote  recall. Lymph node survey: No cervical axillary or inguinal lymphadenopathy noted.  Lab Results:  Basic Metabolic Panel:    Component Value Date/Time   NA 138 08/01/2021 1219   NA 133 (L) 07/26/2021 1047   K 4.7 08/01/2021 1219   CL 106 08/01/2021 1219   CO2 23 08/01/2021 1219   BUN 23 (H) 08/01/2021 1219   BUN 57 (H) 07/26/2021 1047   CREATININE 2.61 (H) 08/01/2021 1219   CREATININE 1.45 (H) 05/12/2014 1430   GLUCOSE 94 08/01/2021 1219   CALCIUM 8.4 (L) 08/01/2021 1219   CBC:    Component Value Date/Time   WBC 19.9 (H) 08/01/2021 1219   HGB 7.0 (L) 08/01/2021 1219   HGB 6.6 (LL) 07/26/2021 1047   HCT 19.8 (L) 08/01/2021 1219   HCT 18.0 (LL) 07/26/2021 1047   PLT 302 08/01/2021 1219   PLT 244 07/26/2021 1047   MCV 108.8 (H) 08/01/2021 1219   MCV 100 (H) 07/26/2021 1047   NEUTROABS 8.8 (H) 07/31/2021 1037   NEUTROABS 10.1 (H) 07/26/2021 1047   LYMPHSABS 11.8 (H) 07/31/2021 1037   LYMPHSABS 8.9 (H) 07/26/2021 1047   MONOABS 1.5 (H) 07/31/2021 1037   EOSABS 0.5 07/31/2021 1037   EOSABS 0.5 (H) 07/26/2021 1047   BASOSABS 0.1 07/31/2021 1037   BASOSABS 0.1 07/26/2021 1047    No results found for this or any previous visit (from the past 240 hour(s)).  Studies/Results: No results found.  Medications: Scheduled Meds:  amitriptyline  50 mg Oral QHS   calcitRIOL  0.25 mcg Oral Q breakfast  Deferiprone (Twice Daily)  1,000 mg Oral BID   enoxaparin (LOVENOX) injection  30 mg Subcutaneous L39Q   folic acid  1 mg Oral q morning   HYDROmorphone   Intravenous Q4H   hydroxyurea  500 mg Oral Once per day on Sun Mon Wed Fri Sat   And   [START ON 08/02/2021] hydroxyurea  1,000 mg Oral Once per day on Tue Thu   oxyCODONE  15 mg Oral Q12H   patiromer  8.4 g Oral Daily   senna-docusate  1 tablet Oral BID   sodium bicarbonate  1,950 mg Oral TID WC   voxelotor  1,500 mg Oral Q breakfast   Continuous Infusions:  sodium chloride Stopped (07/31/21 1207)   PRN  Meds:.acetaminophen, diphenhydrAMINE, naloxone **AND** sodium chloride flush, ondansetron, ondansetron (ZOFRAN) IV, ondansetron **OR** ondansetron (ZOFRAN) IV, oxyCODONE, polyethylene glycol  Consultants: none  Procedures: none  Antibiotics: none  Assessment/Plan: Principal Problem:   Sickle cell pain crisis ([place]) Active Problems:   CKD (chronic kidney disease), stage IV (HCC)   Acute renal failure superimposed on chronic kidney disease (HCC)   GERD (gastroesophageal reflux disease)   Chronic pain syndrome  Sickle cell disease with pain crisis: Pain has improved some overnight.  Weaning IV Dilaudid PCA.  Restart oxycodone 10 mg every 4 hours as needed for severe breakthrough pain. Decrease IV fluids, 0.45% saline at 50 mL/h Monitor vital signs very closely, reevaluate pain scale regularly, and supplemental oxygen as needed Possible discharge on 08/02/2021  Acute on chronic kidney disease stage IV: Creatinine continues to trend down.  Baseline is 2.4-2.7.  Continue monitoring closely.  Labs in AM.  Leukocytosis: Improving.  Stable.  No signs of acute infection.  Monitor closely.  History of polysubstance abuse: Most recent UDS positive for marijuana.  Patient counseled on the dangers of smoking, especially in the setting of sickle cell disease.  Chronic pain syndrome: Continue home medications  Persistent nausea and vomiting: Resolved.  Advance diet today.  Continue antiemetics as needed.   Code Status: Full Code Family Communication: N/A Disposition Plan: Not yet ready for discharge.  Possible discharge on 08/02/2021 [name]  APRN, MSN, FNP-C [name] [place], [place] [address] [phone]  If 7PM-7AM, please contact night-coverage.  08/01/2021, 3:05 PM  LOS: 2 days

## 2021-08-02 ENCOUNTER — Other Ambulatory Visit (HOSPITAL_COMMUNITY): Payer: Self-pay

## 2021-08-02 ENCOUNTER — Other Ambulatory Visit: Payer: Self-pay | Admitting: Family Medicine

## 2021-08-02 DIAGNOSIS — D571 Sickle-cell disease without crisis: Secondary | ICD-10-CM

## 2021-08-02 DIAGNOSIS — G894 Chronic pain syndrome: Secondary | ICD-10-CM

## 2021-08-02 DIAGNOSIS — D5700 Hb-SS disease with crisis, unspecified: Secondary | ICD-10-CM | POA: Diagnosis not present

## 2021-08-02 MED ORDER — OXYCODONE HCL 10 MG PO TABS
10.0000 mg | ORAL_TABLET | Freq: Four times a day (QID) | ORAL | 0 refills | Status: DC | PRN
  Filled 2021-08-02: qty 60, 15d supply, fill #0

## 2021-08-02 NOTE — Progress Notes (Signed)
Reviewed PDMP substance reporting system prior to prescribing opiate medications. No inconsistencies noted.  Meds ordered this encounter  Medications   Oxycodone HCl 10 MG TABS    Sig: Take 1 tablet (10 mg total) by mouth every 6 (six) hours as needed.    Dispense:  60 tablet    Refill:  0    Order Specific Question:   Supervising Provider    Answer:   [name], [name] [[id number]]   [name] Hanks Moore Jamieson Hetland  APRN, MSN, FNP-C [facility] [address]  [place], [place] [address] [phone]  

## 2021-08-03 ENCOUNTER — Other Ambulatory Visit (HOSPITAL_COMMUNITY): Payer: Self-pay

## 2021-08-08 ENCOUNTER — Telehealth: Payer: Self-pay | Admitting: Family Medicine

## 2021-08-08 ENCOUNTER — Other Ambulatory Visit: Payer: Self-pay | Admitting: Family Medicine

## 2021-08-08 ENCOUNTER — Other Ambulatory Visit (HOSPITAL_COMMUNITY): Payer: Self-pay

## 2021-08-08 DIAGNOSIS — G894 Chronic pain syndrome: Secondary | ICD-10-CM

## 2021-08-08 DIAGNOSIS — D571 Sickle-cell disease without crisis: Secondary | ICD-10-CM

## 2021-08-08 MED ORDER — OXYCODONE HCL ER 15 MG PO T12A
15.0000 mg | EXTENDED_RELEASE_TABLET | Freq: Two times a day (BID) | ORAL | 0 refills | Status: DC
  Filled 2021-08-11: qty 60, 30d supply, fill #0

## 2021-08-08 NOTE — Progress Notes (Signed)
Reviewed PDMP substance reporting system prior to prescribing opiate medications. No inconsistencies noted.  Meds ordered this encounter  Medications   oxyCODONE (OXYCONTIN) 15 mg 12 hr tablet    Sig: Take 1 tablet (15 mg total) by mouth every 12 (twelve) hours.    Dispense:  60 tablet    Refill:  0    Order Specific Question:   Supervising Provider    Answer:   [name], [name] [[id number]]   [name] Kampa Moore Ching Rabideau  APRN, MSN, FNP-C [facility] [address]  [place], [place] [address] [phone]  

## 2021-08-09 ENCOUNTER — Other Ambulatory Visit (HOSPITAL_COMMUNITY): Payer: Self-pay

## 2021-08-11 ENCOUNTER — Other Ambulatory Visit (HOSPITAL_COMMUNITY): Payer: Self-pay

## 2021-08-15 ENCOUNTER — Non-Acute Institutional Stay (HOSPITAL_COMMUNITY)
Admission: RE | Admit: 2021-08-15 | Discharge: 2021-08-15 | Disposition: A | Discharge status: Home / Self Care | Payer: Medicare Other | Source: Ambulatory Visit | Attending: Internal Medicine | Admitting: Internal Medicine

## 2021-08-15 ENCOUNTER — Other Ambulatory Visit: Payer: Self-pay | Admitting: Family Medicine

## 2021-08-15 DIAGNOSIS — D571 Sickle-cell disease without crisis: Secondary | ICD-10-CM | POA: Diagnosis present

## 2021-08-15 LAB — COMPREHENSIVE METABOLIC PANEL
ALT: 22 U/L (ref 0–44)
AST: 29 U/L (ref 15–41)
Albumin: 3.4 g/dL — ABNORMAL LOW (ref 3.5–5.0)
Alkaline Phosphatase: 128 U/L — ABNORMAL HIGH (ref 38–126)
Anion gap: 9 (ref 5–15)
BUN: 37 mg/dL — ABNORMAL HIGH (ref 6–20)
CO2: 23 mmol/L (ref 22–32)
Calcium: 8.7 mg/dL — ABNORMAL LOW (ref 8.9–10.3)
Chloride: 110 mmol/L (ref 98–111)
Creatinine, Ser: 3.54 mg/dL — ABNORMAL HIGH (ref 0.61–1.24)
GFR, Estimated: 22 mL/min — ABNORMAL LOW (ref >60)
Glucose, Bld: 93 mg/dL (ref 70–99)
Potassium: 5.4 mmol/L — ABNORMAL HIGH (ref 3.5–5.1)
Sodium: 142 mmol/L (ref 135–145)
Total Bilirubin: 1.2 mg/dL (ref 0.3–1.2)
Total Protein: 7.3 g/dL (ref 6.5–8.1)

## 2021-08-15 LAB — CBC WITH DIFFERENTIAL/PLATELET
Abs Immature Granulocytes: 0.04 10*3/uL (ref 0.00–0.07)
Basophils Absolute: 0 10*3/uL (ref 0.0–0.1)
Basophils Relative: 0 %
Eosinophils Absolute: 0.3 10*3/uL (ref 0.0–0.5)
Eosinophils Relative: 2 %
HCT: 24.7 % — ABNORMAL LOW (ref 39.0–52.0)
Hemoglobin: 8.5 g/dL — ABNORMAL LOW (ref 13.0–17.0)
Immature Granulocytes: 0 %
Lymphocytes Relative: 44 %
Lymphs Abs: 5.5 10*3/uL — ABNORMAL HIGH (ref 0.7–4.0)
MCH: 40.5 pg — ABNORMAL HIGH (ref 26.0–34.0)
MCHC: 34.4 g/dL (ref 30.0–36.0)
MCV: 117.6 fL — ABNORMAL HIGH (ref 80.0–100.0)
Monocytes Absolute: 0.8 10*3/uL (ref 0.1–1.0)
Monocytes Relative: 6 %
Neutro Abs: 5.9 10*3/uL (ref 1.7–7.7)
Neutrophils Relative %: 48 %
Platelets: 405 10*3/uL — ABNORMAL HIGH (ref 150–400)
RBC: 2.1 MIL/uL — ABNORMAL LOW (ref 4.22–5.81)
RDW: 19.5 % — ABNORMAL HIGH (ref 11.5–15.5)
WBC: 12.6 10*3/uL — ABNORMAL HIGH (ref 4.0–10.5)
nRBC: 0 % (ref 0.0–0.2)

## 2021-08-15 LAB — RETICULOCYTES
Immature Retic Fract: 30.7 % — ABNORMAL HIGH (ref 2.3–15.9)
RBC.: 2.12 MIL/uL — ABNORMAL LOW (ref 4.22–5.81)
Retic Count, Absolute: 96.7 10*3/uL (ref 19.0–186.0)
Retic Ct Pct: 4.6 % — ABNORMAL HIGH (ref 0.4–3.1)

## 2021-08-15 LAB — LACTATE DEHYDROGENASE: LDH: 160 U/L (ref 98–192)

## 2021-08-15 MED ORDER — SODIUM CHLORIDE 0.45 % IV BOLUS
500.0000 mL | Freq: Once | INTRAVENOUS | Status: AC
  Administered 2021-08-15: 500 mL via INTRAVENOUS

## 2021-08-15 NOTE — Progress Notes (Signed)
PATIENT CARE CENTER NOTE   Diagnosis: Hb-SS disease without crisis [facility]) [D57.1]    Provider: Hollis, [place], FNP   Procedure: Lab work   Note:  Patient's labs drawn (CBC w/diff, CMP, Retic, LDH) peripherally. Patient tolerated well. Alert, oriented and ambulatory at discharge.

## 2021-08-15 NOTE — Progress Notes (Addendum)
PATIENT CARE CENTER NOTE   Diagnosis: Elevated creatinine    Provider: Hollis, [place], FNP   Procedure: IV fluids bolus   Note:  Patient received 500 ml bolus of 0.45% Sodium Chloride via PIV. Patient tolerated infusion well with no adverse reaction. Vital signs stable. AVS offered but patient refused.  Patient to come back later this week to recheck labs.  Patient alert, oriented and ambulatory at discharge.

## 2021-08-23 ENCOUNTER — Telehealth: Payer: Self-pay

## 2021-08-23 ENCOUNTER — Other Ambulatory Visit (HOSPITAL_COMMUNITY): Payer: Self-pay

## 2021-08-23 ENCOUNTER — Other Ambulatory Visit: Payer: Self-pay | Admitting: Family Medicine

## 2021-08-23 DIAGNOSIS — D571 Sickle-cell disease without crisis: Secondary | ICD-10-CM

## 2021-08-23 DIAGNOSIS — G894 Chronic pain syndrome: Secondary | ICD-10-CM

## 2021-08-23 MED ORDER — OXYCODONE HCL ER 15 MG PO T12A
15.0000 mg | EXTENDED_RELEASE_TABLET | Freq: Two times a day (BID) | ORAL | 0 refills | Status: DC
  Filled 2021-08-23: qty 60, 30d supply, fill #0

## 2021-08-23 NOTE — Progress Notes (Signed)
Reviewed PDMP substance reporting system prior to prescribing opiate medications. No inconsistencies noted.  Meds ordered this encounter  Medications   oxyCODONE (OXYCONTIN) 15 mg 12 hr tablet    Sig: Take 1 tablet (15 mg total) by mouth every 12 (twelve) hours.    Dispense:  60 tablet    Refill:  0    Order Specific Question:   Supervising Provider    Answer:   [name], [name] [[id number]]   Daryan Cagley Moore Garland Smouse  APRN, MSN, FNP-C [facility] [address]  [place], [place] [address] [phone]  

## 2021-08-23 NOTE — Telephone Encounter (Signed)
Oxy Contin 10 mg

## 2021-08-24 ENCOUNTER — Other Ambulatory Visit (HOSPITAL_COMMUNITY): Payer: Self-pay

## 2021-08-24 ENCOUNTER — Other Ambulatory Visit: Payer: Self-pay | Admitting: Family Medicine

## 2021-08-24 DIAGNOSIS — D571 Sickle-cell disease without crisis: Secondary | ICD-10-CM

## 2021-08-24 DIAGNOSIS — G894 Chronic pain syndrome: Secondary | ICD-10-CM

## 2021-08-26 ENCOUNTER — Other Ambulatory Visit: Payer: Self-pay | Admitting: Family Medicine

## 2021-08-26 ENCOUNTER — Telehealth: Payer: Self-pay

## 2021-08-26 ENCOUNTER — Non-Acute Institutional Stay (HOSPITAL_COMMUNITY)
Admission: RE | Admit: 2021-08-26 | Discharge: 2021-08-26 | Disposition: A | Discharge status: Home / Self Care | Payer: Medicare Other | Source: Ambulatory Visit | Attending: Internal Medicine | Admitting: Internal Medicine

## 2021-08-26 ENCOUNTER — Other Ambulatory Visit (HOSPITAL_COMMUNITY): Payer: Self-pay

## 2021-08-26 DIAGNOSIS — N184 Chronic kidney disease, stage 4 (severe): Secondary | ICD-10-CM | POA: Insufficient documentation

## 2021-08-26 DIAGNOSIS — D5700 Hb-SS disease with crisis, unspecified: Secondary | ICD-10-CM

## 2021-08-26 DIAGNOSIS — D571 Sickle-cell disease without crisis: Secondary | ICD-10-CM

## 2021-08-26 DIAGNOSIS — G894 Chronic pain syndrome: Secondary | ICD-10-CM

## 2021-08-26 LAB — COMPREHENSIVE METABOLIC PANEL
ALT: 104 U/L — ABNORMAL HIGH (ref 0–44)
AST: 116 U/L — ABNORMAL HIGH (ref 15–41)
Albumin: 3.5 g/dL (ref 3.5–5.0)
Alkaline Phosphatase: 178 U/L — ABNORMAL HIGH (ref 38–126)
Anion gap: 11 (ref 5–15)
BUN: 45 mg/dL — ABNORMAL HIGH (ref 6–20)
CO2: 19 mmol/L — ABNORMAL LOW (ref 22–32)
Calcium: 8.5 mg/dL — ABNORMAL LOW (ref 8.9–10.3)
Chloride: 106 mmol/L (ref 98–111)
Creatinine, Ser: 3.22 mg/dL — ABNORMAL HIGH (ref 0.61–1.24)
GFR, Estimated: 24 mL/min — ABNORMAL LOW (ref >60)
Glucose, Bld: 79 mg/dL (ref 70–99)
Potassium: 5.5 mmol/L — ABNORMAL HIGH (ref 3.5–5.1)
Sodium: 136 mmol/L (ref 135–145)
Total Bilirubin: 1 mg/dL (ref 0.3–1.2)
Total Protein: 7.2 g/dL (ref 6.5–8.1)

## 2021-08-26 LAB — CBC WITH DIFFERENTIAL/PLATELET
Abs Immature Granulocytes: 0.04 10*3/uL (ref 0.00–0.07)
Basophils Absolute: 0.1 10*3/uL (ref 0.0–0.1)
Basophils Relative: 0 %
Eosinophils Absolute: 0.4 10*3/uL (ref 0.0–0.5)
Eosinophils Relative: 2 %
HCT: 26.2 % — ABNORMAL LOW (ref 39.0–52.0)
Hemoglobin: 9.3 g/dL — ABNORMAL LOW (ref 13.0–17.0)
Immature Granulocytes: 0 %
Lymphocytes Relative: 51 %
Lymphs Abs: 8.2 10*3/uL — ABNORMAL HIGH (ref 0.7–4.0)
MCH: 40.8 pg — ABNORMAL HIGH (ref 26.0–34.0)
MCHC: 35.5 g/dL (ref 30.0–36.0)
MCV: 114.9 fL — ABNORMAL HIGH (ref 80.0–100.0)
Monocytes Absolute: 1 10*3/uL (ref 0.1–1.0)
Monocytes Relative: 6 %
Neutro Abs: 6.6 10*3/uL (ref 1.7–7.7)
Neutrophils Relative %: 41 %
Platelets: 292 10*3/uL (ref 150–400)
RBC: 2.28 MIL/uL — ABNORMAL LOW (ref 4.22–5.81)
RDW: 18 % — ABNORMAL HIGH (ref 11.5–15.5)
WBC: 16.2 10*3/uL — ABNORMAL HIGH (ref 4.0–10.5)
nRBC: 0.1 % (ref 0.0–0.2)

## 2021-08-26 MED ORDER — OXYCODONE HCL 10 MG PO TABS
10.0000 mg | ORAL_TABLET | Freq: Four times a day (QID) | ORAL | 0 refills | Status: DC | PRN
  Filled 2021-08-26: qty 60, 15d supply, fill #0

## 2021-08-26 NOTE — Progress Notes (Signed)
Reviewed PDMP substance reporting system prior to prescribing opiate medications. No inconsistencies noted.  Meds ordered this encounter  Medications   Oxycodone HCl 10 MG TABS    Sig: Take 1 tablet (10 mg total) by mouth every 6 (six) hours as needed.    Dispense:  60 tablet    Refill:  0    Order Specific Question:   Supervising Provider    Answer:   [name], [name] [[id number]]   [name] Sroka Moore Price Lachapelle  APRN, MSN, FNP-C [name] [address]  [place], [place] [address] [phone]  

## 2021-08-26 NOTE — Telephone Encounter (Signed)
Oxy Contin 

## 2021-08-26 NOTE — Progress Notes (Signed)
PATIENT CARE CENTER NOTE     Diagnosis:  Sickle-cell disease with pain ([place]) [D57.00]      Provider: Hollis, [place], FNP     Procedure: Lab work     Note:  Patient's labs drawn (CBC w/diff, CMP) peripherally. Patient tolerated well. Alert, oriented and ambulatory at discharge.

## 2021-08-28 IMAGING — CT CT HEAD W/O CM
3 of 4 series · 16 of 47 positions shown, 19 images · non-contrast
Comparison: 01/06/2019

CLINICAL DATA: Headache

EXAM:
CT HEAD WITHOUT CONTRAST
TECHNIQUE: Contiguous axial images were obtained from the base of the skull
through the vertex without intravenous contrast.

[Series 2: head wo · axial · 0.47mm/px · z∈[-105,+30]mm · 10 of 33 slices shown, 13 images]
[im 3/33  brain]
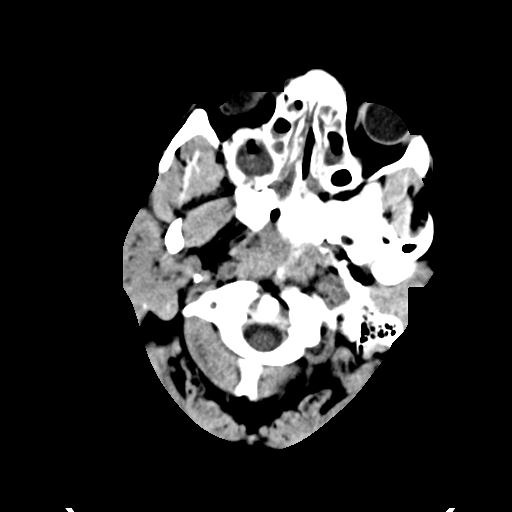
[im 3/33  bone]
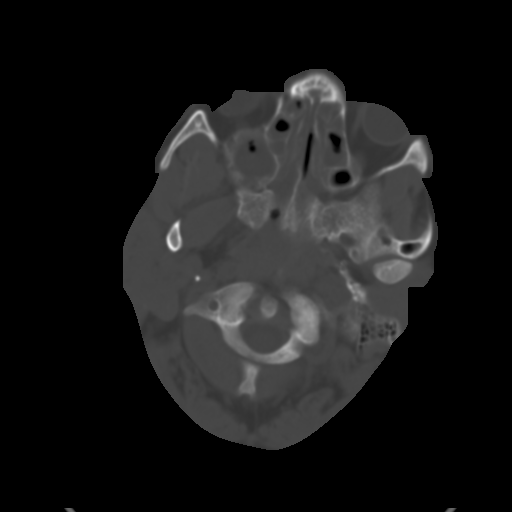
[im 5/33  brain]
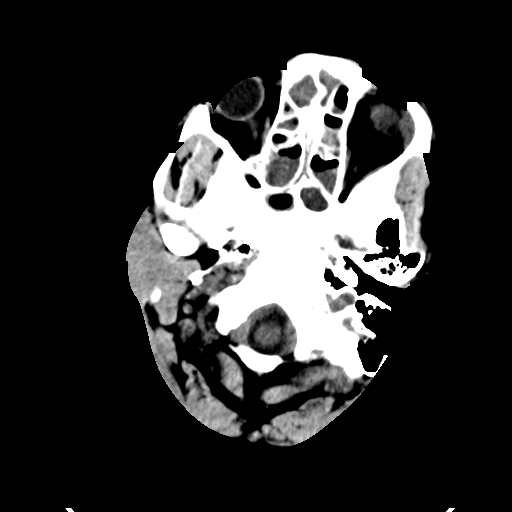
[im 10/33  brain]
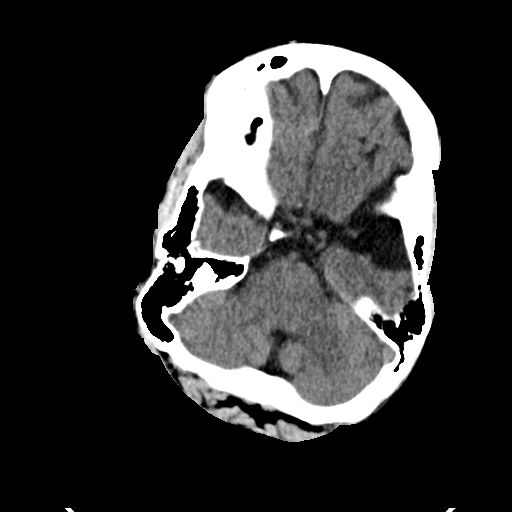
[im 12/33  brain]
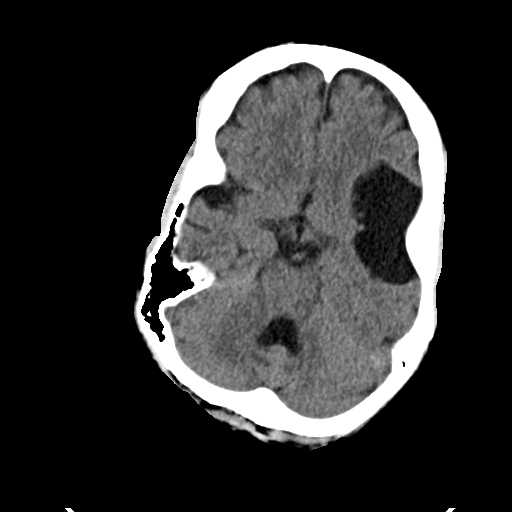
[im 14/33  brain]
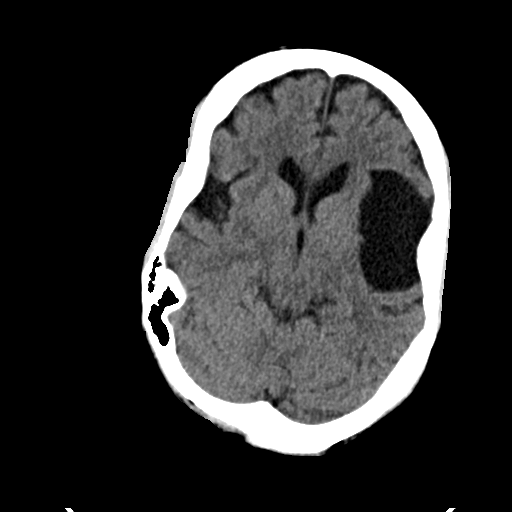
[im 14/33  bone]
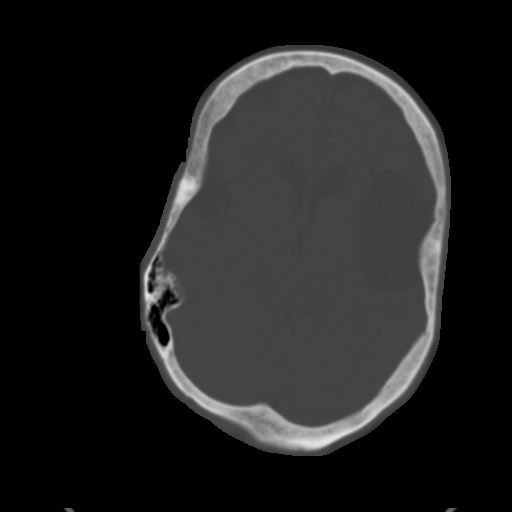
[im 19/33  brain]
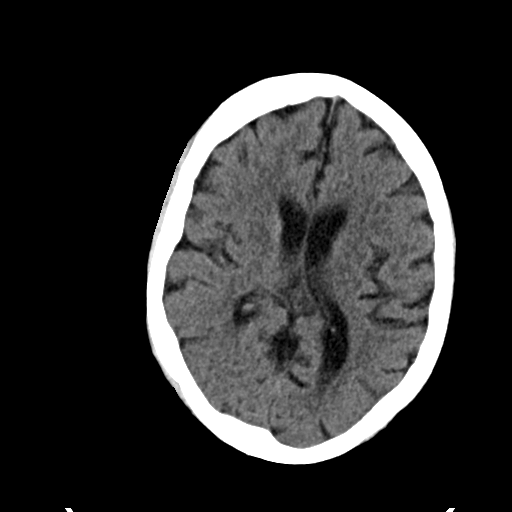
[im 21/33  brain]
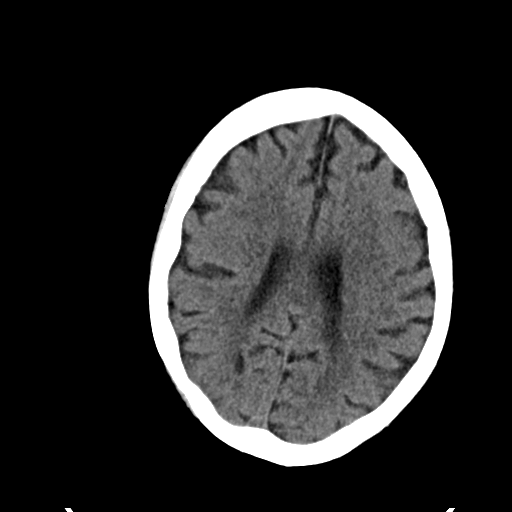
[im 23/33  brain]
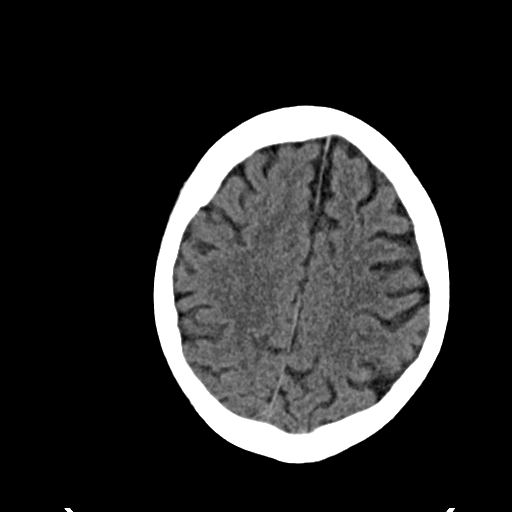
[im 28/33  brain]
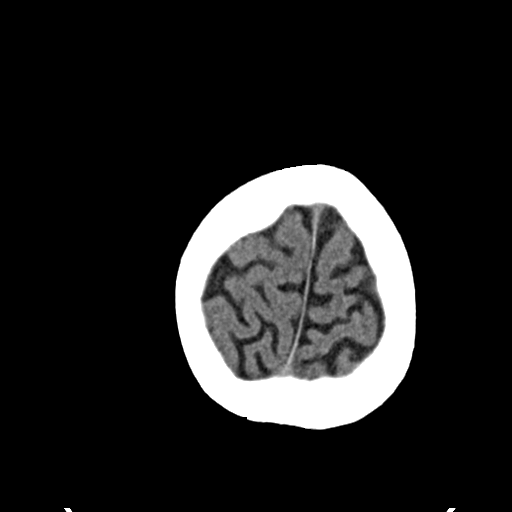
[im 28/33  bone]
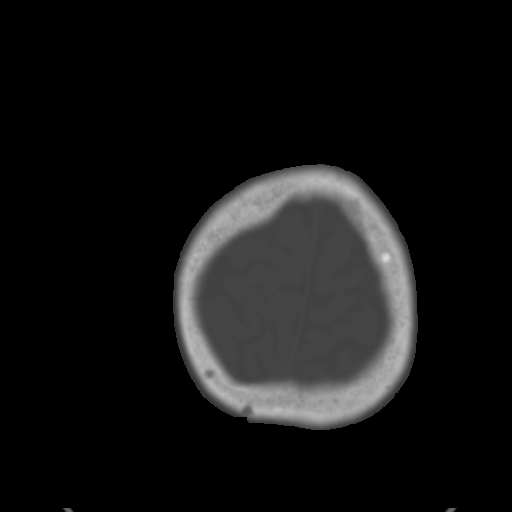
[im 30/33  brain]
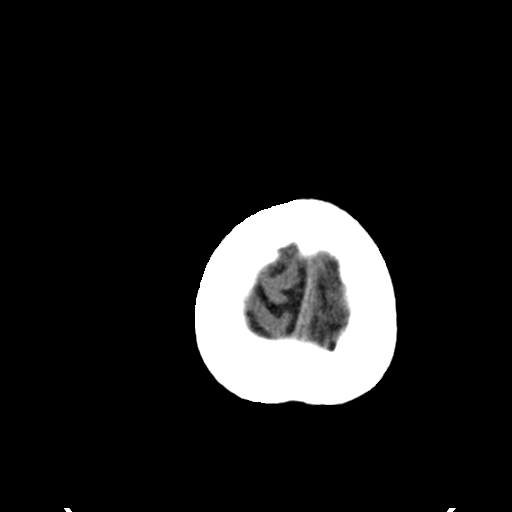

[Series 4: coronal soft tissue · coronal · 0.30mm/px · 3 of 67 slices shown]
[im 23/67  brain]
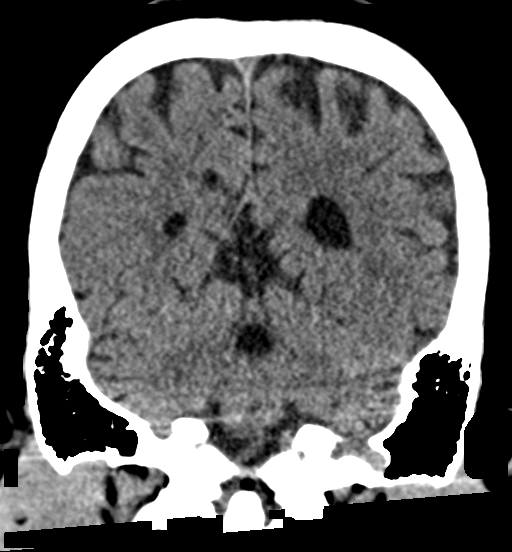
[im 30/67  brain]
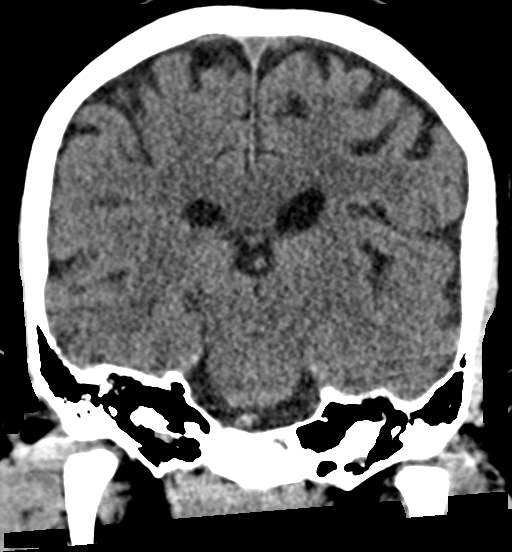
[im 37/67  brain]
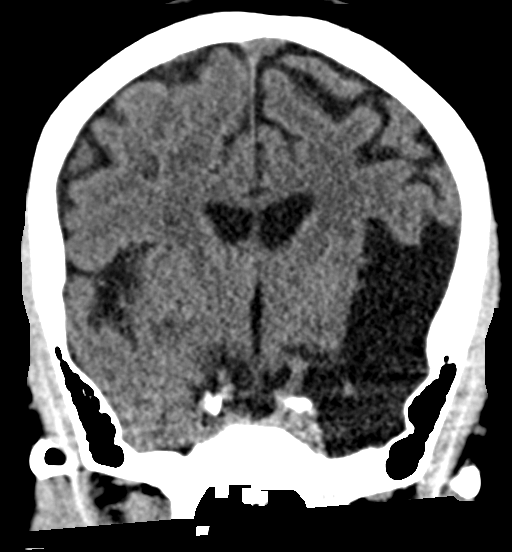

[Series 5: sagittal soft tissue · sagittal · 0.32mm/px · 3 of 51 slices shown]
[im 18/51  brain]
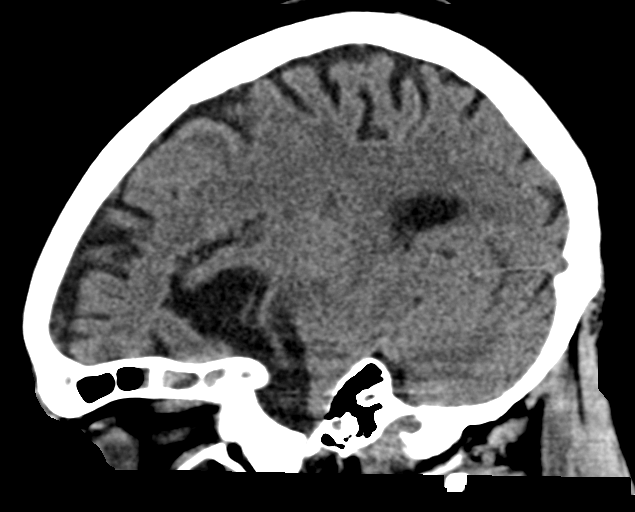
[im 26/51  brain]
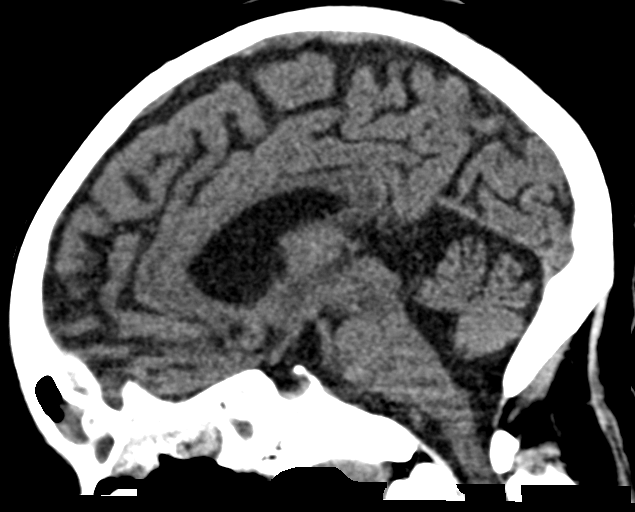
[im 33/51  brain]
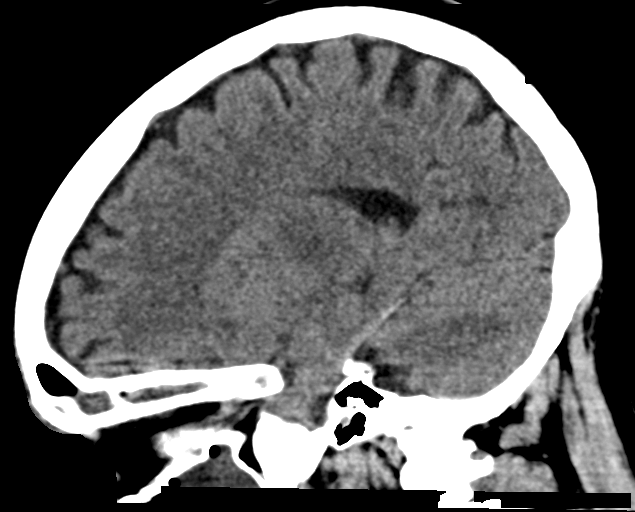

[16 of 47 positions shown; findings below may reference images not displayed]

FINDINGS: Brain: Large CSF collections seen in the temporal regions
bilaterally, left greater than right, stable since prior study.
Diffuse cerebral atrophy, advanced for patient's age. No acute
infarction, hemorrhage or hydrocephalus.

Vascular: No hyperdense vessel or unexpected calcification.

Skull: No acute calvarial abnormality.

Sinuses/Orbits: Air-fluid levels throughout the paranasal sinuses
diffusely. Mastoid air cells clear.

Other: None
IMPRESSION: Stable CSF collections in the temporal regions bilaterally, left
larger than right.

Age advanced atrophy.

No acute intracranial abnormality.

Diffuse acute on chronic sinusitis.

## 2021-09-01 ENCOUNTER — Telehealth: Payer: Self-pay

## 2021-09-01 ENCOUNTER — Other Ambulatory Visit (HOSPITAL_COMMUNITY): Payer: Self-pay

## 2021-09-01 ENCOUNTER — Other Ambulatory Visit: Payer: Self-pay | Admitting: Family Medicine

## 2021-09-01 DIAGNOSIS — G894 Chronic pain syndrome: Secondary | ICD-10-CM

## 2021-09-01 DIAGNOSIS — D571 Sickle-cell disease without crisis: Secondary | ICD-10-CM

## 2021-09-01 MED ORDER — OXYCODONE HCL ER 15 MG PO T12A
15.0000 mg | EXTENDED_RELEASE_TABLET | Freq: Two times a day (BID) | ORAL | 0 refills | Status: DC
  Filled 2021-09-10: qty 60, 30d supply, fill #0

## 2021-09-01 NOTE — Progress Notes (Signed)
Reviewed PDMP substance reporting system prior to prescribing opiate medications. No inconsistencies noted.  Meds ordered this encounter  Medications   oxyCODONE (OXYCONTIN) 15 mg 12 hr tablet    Sig: Take 1 tablet (15 mg total) by mouth every 12 (twelve) hours.    Dispense:  60 tablet    Refill:  0    Order Specific Question:   Supervising Provider    Answer:   [name], [name] [[id number]]   [name] [name] [name] Tieken  APRN, MSN, FNP-C [facility] [address]  [place], [place] [address] [phone]  

## 2021-09-01 NOTE — Telephone Encounter (Signed)
Oxy Contin 15 mg

## 2021-09-02 ENCOUNTER — Other Ambulatory Visit (HOSPITAL_COMMUNITY): Payer: Self-pay

## 2021-09-08 ENCOUNTER — Other Ambulatory Visit (HOSPITAL_COMMUNITY): Payer: Self-pay

## 2021-09-10 ENCOUNTER — Other Ambulatory Visit (HOSPITAL_COMMUNITY): Payer: Self-pay

## 2021-09-17 ENCOUNTER — Inpatient Hospital Stay (HOSPITAL_COMMUNITY)
Admission: EM | Admit: 2021-09-17 | Discharge: 2021-09-19 | DRG: 812 | Disposition: A | Discharge status: Home / Self Care | Payer: Medicare Other | Attending: Internal Medicine | Admitting: Internal Medicine

## 2021-09-17 ENCOUNTER — Emergency Department (HOSPITAL_COMMUNITY): Payer: Medicare Other

## 2021-09-17 ENCOUNTER — Encounter (HOSPITAL_COMMUNITY): Payer: Self-pay | Admitting: Emergency Medicine

## 2021-09-17 ENCOUNTER — Other Ambulatory Visit: Payer: Self-pay

## 2021-09-17 DIAGNOSIS — Z9049 Acquired absence of other specified parts of digestive tract: Secondary | ICD-10-CM | POA: Diagnosis not present

## 2021-09-17 DIAGNOSIS — Z87891 Personal history of nicotine dependence: Secondary | ICD-10-CM

## 2021-09-17 DIAGNOSIS — F1491 Cocaine use, unspecified, in remission: Secondary | ICD-10-CM | POA: Diagnosis not present

## 2021-09-17 DIAGNOSIS — Z886 Allergy status to analgesic agent status: Secondary | ICD-10-CM

## 2021-09-17 DIAGNOSIS — G43909 Migraine, unspecified, not intractable, without status migrainosus: Secondary | ICD-10-CM | POA: Diagnosis present

## 2021-09-17 DIAGNOSIS — G894 Chronic pain syndrome: Secondary | ICD-10-CM | POA: Diagnosis present

## 2021-09-17 DIAGNOSIS — Z885 Allergy status to narcotic agent status: Secondary | ICD-10-CM

## 2021-09-17 DIAGNOSIS — Z79891 Long term (current) use of opiate analgesic: Secondary | ICD-10-CM | POA: Diagnosis not present

## 2021-09-17 DIAGNOSIS — R945 Abnormal results of liver function studies: Secondary | ICD-10-CM | POA: Diagnosis not present

## 2021-09-17 DIAGNOSIS — N059 Unspecified nephritic syndrome with unspecified morphologic changes: Secondary | ICD-10-CM | POA: Diagnosis present

## 2021-09-17 DIAGNOSIS — N179 Acute kidney failure, unspecified: Secondary | ICD-10-CM

## 2021-09-17 DIAGNOSIS — M545 Low back pain, unspecified: Secondary | ICD-10-CM | POA: Diagnosis present

## 2021-09-17 DIAGNOSIS — N189 Chronic kidney disease, unspecified: Secondary | ICD-10-CM | POA: Diagnosis not present

## 2021-09-17 DIAGNOSIS — Z8701 Personal history of pneumonia (recurrent): Secondary | ICD-10-CM | POA: Diagnosis not present

## 2021-09-17 DIAGNOSIS — N184 Chronic kidney disease, stage 4 (severe): Secondary | ICD-10-CM | POA: Diagnosis present

## 2021-09-17 DIAGNOSIS — D571 Sickle-cell disease without crisis: Secondary | ICD-10-CM

## 2021-09-17 DIAGNOSIS — K219 Gastro-esophageal reflux disease without esophagitis: Secondary | ICD-10-CM | POA: Diagnosis present

## 2021-09-17 DIAGNOSIS — Z79899 Other long term (current) drug therapy: Secondary | ICD-10-CM

## 2021-09-17 DIAGNOSIS — D5700 Hb-SS disease with crisis, unspecified: Secondary | ICD-10-CM | POA: Diagnosis not present

## 2021-09-17 DIAGNOSIS — D72829 Elevated white blood cell count, unspecified: Secondary | ICD-10-CM | POA: Diagnosis present

## 2021-09-17 LAB — CBC WITH DIFFERENTIAL/PLATELET
Abs Immature Granulocytes: 0 10*3/uL (ref 0.00–0.07)
Basophils Absolute: 0 10*3/uL (ref 0.0–0.1)
Basophils Relative: 0 %
Eosinophils Absolute: 0.9 10*3/uL — ABNORMAL HIGH (ref 0.0–0.5)
Eosinophils Relative: 6 %
HCT: 19.6 % — ABNORMAL LOW (ref 39.0–52.0)
Hemoglobin: 7.3 g/dL — ABNORMAL LOW (ref 13.0–17.0)
Lymphocytes Relative: 37 %
Lymphs Abs: 5.8 10*3/uL — ABNORMAL HIGH (ref 0.7–4.0)
MCH: 40.8 pg — ABNORMAL HIGH (ref 26.0–34.0)
MCHC: 37.2 g/dL — ABNORMAL HIGH (ref 30.0–36.0)
MCV: 109.5 fL — ABNORMAL HIGH (ref 80.0–100.0)
Monocytes Absolute: 0.3 10*3/uL (ref 0.1–1.0)
Monocytes Relative: 2 %
Neutro Abs: 8.7 10*3/uL — ABNORMAL HIGH (ref 1.7–7.7)
Neutrophils Relative %: 55 %
Platelet Morphology: NORMAL
Platelets: 150 10*3/uL (ref 150–400)
RBC: 1.79 MIL/uL — ABNORMAL LOW (ref 4.22–5.81)
RDW: 21.9 % — ABNORMAL HIGH (ref 11.5–15.5)
WBC: 15.8 10*3/uL — ABNORMAL HIGH (ref 4.0–10.5)
nRBC: 0.3 % — ABNORMAL HIGH (ref 0.0–0.2)

## 2021-09-17 LAB — RAPID URINE DRUG SCREEN, HOSP PERFORMED
Amphetamines: NOT DETECTED
Barbiturates: NOT DETECTED
Benzodiazepines: NOT DETECTED
Cocaine: NOT DETECTED
Opiates: POSITIVE — AB
Tetrahydrocannabinol: POSITIVE — AB

## 2021-09-17 LAB — COMPREHENSIVE METABOLIC PANEL
ALT: 65 U/L — ABNORMAL HIGH (ref 0–44)
AST: 65 U/L — ABNORMAL HIGH (ref 15–41)
Albumin: 3.7 g/dL (ref 3.5–5.0)
Alkaline Phosphatase: 231 U/L — ABNORMAL HIGH (ref 38–126)
Anion gap: 10 (ref 5–15)
BUN: 80 mg/dL — ABNORMAL HIGH (ref 6–20)
CO2: 16 mmol/L — ABNORMAL LOW (ref 22–32)
Calcium: 9 mg/dL (ref 8.9–10.3)
Chloride: 112 mmol/L — ABNORMAL HIGH (ref 98–111)
Creatinine, Ser: 4.14 mg/dL — ABNORMAL HIGH (ref 0.61–1.24)
GFR, Estimated: 18 mL/min — ABNORMAL LOW (ref >60)
Glucose, Bld: 168 mg/dL — ABNORMAL HIGH (ref 70–99)
Potassium: 4.7 mmol/L (ref 3.5–5.1)
Sodium: 138 mmol/L (ref 135–145)
Total Bilirubin: 1.4 mg/dL — ABNORMAL HIGH (ref 0.3–1.2)
Total Protein: 7.9 g/dL (ref 6.5–8.1)

## 2021-09-17 LAB — URINALYSIS, ROUTINE W REFLEX MICROSCOPIC
Bacteria, UA: NONE SEEN
Bilirubin Urine: NEGATIVE
Glucose, UA: 50 mg/dL — AB
Ketones, ur: NEGATIVE mg/dL
Leukocytes,Ua: NEGATIVE
Nitrite: NEGATIVE
Protein, ur: 30 mg/dL — AB
Specific Gravity, Urine: 1.008 (ref 1.005–1.030)
pH: 6 (ref 5.0–8.0)

## 2021-09-17 LAB — CK: Total CK: 28 U/L — ABNORMAL LOW (ref 49–397)

## 2021-09-17 LAB — TROPONIN I (HIGH SENSITIVITY): Troponin I (High Sensitivity): 6 ng/L (ref <18)

## 2021-09-17 LAB — MAGNESIUM: Magnesium: 2.6 mg/dL — ABNORMAL HIGH (ref 1.7–2.4)

## 2021-09-17 LAB — RETICULOCYTES
Immature Retic Fract: 21.5 % — ABNORMAL HIGH (ref 2.3–15.9)
RBC.: 1.8 MIL/uL — ABNORMAL LOW (ref 4.22–5.81)
Retic Count, Absolute: 130.8 10*3/uL (ref 19.0–186.0)
Retic Ct Pct: 7.3 % — ABNORMAL HIGH (ref 0.4–3.1)

## 2021-09-17 MED ORDER — HYDROXYUREA 500 MG PO CAPS
500.0000 mg | ORAL_CAPSULE | ORAL | Status: DC
  Administered 2021-09-17 – 2021-09-18 (×2): 500 mg via ORAL
  Filled 2021-09-17 (×2): qty 1

## 2021-09-17 MED ORDER — HYDROMORPHONE HCL 2 MG/ML IJ SOLN
2.0000 mg | INTRAMUSCULAR | Status: AC
  Administered 2021-09-17: 2 mg via SUBCUTANEOUS
  Filled 2021-09-17: qty 1

## 2021-09-17 MED ORDER — HYDROMORPHONE HCL 2 MG/ML IJ SOLN: 2.0000 mg | INTRAMUSCULAR | Status: DC

## 2021-09-17 MED ORDER — FOLIC ACID 1 MG PO TABS
1.0000 mg | ORAL_TABLET | Freq: Every day | ORAL | Status: DC
  Administered 2021-09-18: 1 mg via ORAL
  Filled 2021-09-17 (×2): qty 1

## 2021-09-17 MED ORDER — DEFERIPRONE (TWICE DAILY) 1000 MG PO TABS
1000.0000 mg | ORAL_TABLET | Freq: Two times a day (BID) | ORAL | Status: DC
  Administered 2021-09-18 (×2): 1000 mg via ORAL

## 2021-09-17 MED ORDER — SODIUM CHLORIDE 0.9 % IV BOLUS
500.0000 mL | Freq: Once | INTRAVENOUS | Status: AC
  Administered 2021-09-17: 500 mL via INTRAVENOUS

## 2021-09-17 MED ORDER — SENNOSIDES-DOCUSATE SODIUM 8.6-50 MG PO TABS
1.0000 | ORAL_TABLET | Freq: Two times a day (BID) | ORAL | Status: DC
  Administered 2021-09-18: 1 via ORAL
  Filled 2021-09-17 (×3): qty 1

## 2021-09-17 MED ORDER — HYDROXYUREA 500 MG PO CAPS: 1000.0000 mg | ORAL_CAPSULE | ORAL | Status: DC

## 2021-09-17 MED ORDER — ONDANSETRON HCL 4 MG/2ML IJ SOLN: 4.0000 mg | INTRAMUSCULAR | Status: DC | PRN

## 2021-09-17 MED ORDER — ONDANSETRON HCL 4 MG/2ML IJ SOLN: 4.0000 mg | Freq: Four times a day (QID) | INTRAMUSCULAR | Status: DC | PRN

## 2021-09-17 MED ORDER — SODIUM CHLORIDE 0.45 % IV SOLN
INTRAVENOUS | Status: DC
Start: 2021-09-17 — End: 2021-09-19

## 2021-09-17 MED ORDER — DEXTROSE-NACL 5-0.45 % IV SOLN: INTRAVENOUS | Status: DC

## 2021-09-17 MED ORDER — DIPHENHYDRAMINE HCL 25 MG PO CAPS: 25.0000 mg | ORAL_CAPSULE | ORAL | Status: DC | PRN

## 2021-09-17 MED ORDER — HYDROMORPHONE 1 MG/ML IV SOLN
INTRAVENOUS | Status: DC
  Administered 2021-09-17: 30 mg via INTRAVENOUS
  Administered 2021-09-17 – 2021-09-18 (×2): 4 mg via INTRAVENOUS
  Administered 2021-09-18: 30 mg via INTRAVENOUS
  Administered 2021-09-18 (×2): 2.5 mg via INTRAVENOUS
  Administered 2021-09-18: 2 mg via INTRAVENOUS
  Administered 2021-09-18: 4.5 mg via INTRAVENOUS
  Administered 2021-09-19: 1 mg via INTRAVENOUS
  Filled 2021-09-17 (×2): qty 30

## 2021-09-17 MED ORDER — AMITRIPTYLINE HCL 25 MG PO TABS
50.0000 mg | ORAL_TABLET | Freq: Every day | ORAL | Status: DC
  Administered 2021-09-17 – 2021-09-18 (×2): 50 mg via ORAL
  Filled 2021-09-17 (×2): qty 2

## 2021-09-17 MED ORDER — OXYCODONE HCL ER 15 MG PO T12A
15.0000 mg | EXTENDED_RELEASE_TABLET | Freq: Two times a day (BID) | ORAL | Status: DC
  Administered 2021-09-17 – 2021-09-18 (×3): 15 mg via ORAL
  Filled 2021-09-17 (×3): qty 1

## 2021-09-17 MED ORDER — VOXELOTOR 500 MG PO TABS
1500.0000 mg | ORAL_TABLET | Freq: Every day | ORAL | Status: DC
  Administered 2021-09-18: 1500 mg via ORAL

## 2021-09-17 MED ORDER — OXYCODONE HCL 5 MG PO TABS
10.0000 mg | ORAL_TABLET | Freq: Four times a day (QID) | ORAL | Status: DC | PRN
  Administered 2021-09-18: 10 mg via ORAL
  Filled 2021-09-17: qty 2

## 2021-09-17 MED ORDER — NALOXONE HCL 0.4 MG/ML IJ SOLN: 0.4000 mg | INTRAMUSCULAR | Status: DC | PRN

## 2021-09-17 MED ORDER — POLYETHYLENE GLYCOL 3350 17 G PO PACK: 17.0000 g | PACK | Freq: Every day | ORAL | Status: DC | PRN

## 2021-09-17 MED ORDER — ONDANSETRON HCL 4 MG/2ML IJ SOLN
4.0000 mg | Freq: Once | INTRAMUSCULAR | Status: AC
  Administered 2021-09-17: 4 mg via INTRAVENOUS
  Filled 2021-09-17: qty 2

## 2021-09-17 MED ORDER — SODIUM CHLORIDE 0.9 % IV SOLN
12.5000 mg | Freq: Once | INTRAVENOUS | Status: AC
  Administered 2021-09-17: 12.5 mg via INTRAVENOUS
  Filled 2021-09-17: qty 12.5

## 2021-09-17 MED ORDER — PATIROMER SORBITEX CALCIUM 8.4 G PO PACK
8.4000 g | PACK | Freq: Every day | ORAL | Status: DC
Start: 2021-09-17 — End: 2021-09-19
  Administered 2021-09-17 – 2021-09-18 (×2): 8.4 g via ORAL
  Filled 2021-09-17 (×3): qty 1

## 2021-09-17 MED ORDER — ENOXAPARIN SODIUM 30 MG/0.3ML IJ SOSY
30.0000 mg | PREFILLED_SYRINGE | INTRAMUSCULAR | Status: DC
  Filled 2021-09-17 (×2): qty 0.3

## 2021-09-17 MED ORDER — HYDROMORPHONE HCL 1 MG/ML IJ SOLN
1.0000 mg | Freq: Once | INTRAMUSCULAR | Status: AC
  Administered 2021-09-17: 1 mg via INTRAVENOUS
  Filled 2021-09-17: qty 1

## 2021-09-17 MED ORDER — SODIUM CHLORIDE 0.9% FLUSH: 9.0000 mL | INTRAVENOUS | Status: DC | PRN

## 2021-09-17 MED ORDER — HYDROMORPHONE HCL 1 MG/ML IJ SOLN
1.0000 mg | INTRAMUSCULAR | Status: DC | PRN
  Administered 2021-09-17: 1 mg via INTRAVENOUS
  Filled 2021-09-17: qty 1

## 2021-09-17 MED ORDER — SODIUM BICARBONATE 650 MG PO TABS
1950.0000 mg | ORAL_TABLET | Freq: Three times a day (TID) | ORAL | Status: DC
  Administered 2021-09-18 (×3): 1950 mg via ORAL
  Filled 2021-09-17 (×5): qty 3

## 2021-09-17 NOTE — H&P (Signed)
H&P  Patient Demographics:  [name] [name], is a 41 y.o. male  MRN: [id number]   DOB - 03-Aug-1980  Admit Date - 09/17/2021  Outpatient Primary MD for the patient is [name], FNP  Chief Complaint  Patient presents with   Sickle Cell Pain Crisis      HPI:   [name] [name]  is a 41 y.o. male with a medical history significant for sickle cell disease, chronic kidney disease stage IV, chronic pain syndrome, tobacco abuse, opiate dependence and tolerance, history of polysubstance abuse, and history of migraine headaches who presented to the emergency room today with major complaints of significant pain in his lower back radiating to his legs bilaterally which is typical of his sickle cell pain crisis.  Patient claims the pain intensity is about 10/10 when he arrived at the emergency room, now about 9/10 after multiple doses of subcutaneous Dilaudid.  He characterized his pain as throbbing and achy and intense.  He took his home pain medications today with no sustained relief. He denies any history of trauma or fall.  No joint redness or swelling.  He denies any fever, cough, chest pain, shortness of breath, nausea, vomiting or diarrhea.  No urinary symptoms.  No sick contacts, recent travels or known exposure to COVID-19.  ED course: Vital signs showed: BP 107/79   Pulse 93   Temp (!) 97.4 F (36.3 C) (Oral)   Resp 11   Ht 5\' 3"  (1.6 m)   Wt 56.7 kg   SpO2 96%   BMI 22.14 kg/m.  Hemoglobin was 7.3 which is around patient's baseline, white cell count of 15.8, reticulocyte count of 7.3%, CMP showed worsening renal function with serum creatinine of 4.14 against baseline of 2.6 with a BUN of 80.  Bicarbonate of 16.  Patient received multiple doses of Dilaudid subcutaneous injection in the emergency room with no major relief in his pain.  Patient will be admitted to the hospital for further evaluation and management of sickle cell pain crisis.   Review of systems:  In addition to the  HPI above, patient reports No fever or chills No Headache, No changes with vision or hearing No problems swallowing food or liquids No chest pain, cough or shortness of breath No abdominal pain, No nausea or vomiting, Bowel movements are regular No blood in stool or urine No dysuria No new skin rashes or bruises No new joints pains-aches No new weakness, tingling, numbness in any extremity No recent weight gain or loss No polyuria, polydypsia or polyphagia No significant Mental Stressors  A full 10 point Review of Systems was done, except as stated above, all other Review of Systems were negative.  With Past History of the following :   Past Medical History:  Diagnosis Date   Abscess of right iliac fossa 09/24/2018   required Perc Drain 09/24/2018   Arachnoid Cyst of brain bilaterally    "2 really small ones in the back of my head; inside; saw them w/MRI" (09/25/2012)   Bacterial pneumonia ~ 2012   "caught it here in the hospital" (09/25/2012)   Chronic kidney disease    "from my sickle cell" (09/25/2012)   CKD (chronic kidney disease), stage II    Colitis 04/19/2017   CT scan abd/ pelvis   GERD (gastroesophageal reflux disease)    "after I eat alot of spicey foods" (09/25/2012)   Gynecomastia, male 07/10/2012   History of blood transfusion    "always related to sickle cell crisis" (09/25/2012)  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   Noted in noted from Hematology notes at [facility]    "take RX qd to prevent them" (09/25/2012)   Opioid dependence with withdrawal ([facility]) 08/30/2012   Renal insufficiency    Sickle cell anemia (HCC)    Sickle cell crisis ([place]) 09/25/2012   Sickle cell nephropathy ([place]) 07/10/2012   Sinus tachycardia    Tachycardia with heart rate 121-140 beats per minute with ambulation 08/04/2016      Past Surgical History:  Procedure Laterality Date   ABCESS DRAINAGE     CHOLECYSTECTOMY  ~ 2012   COLONOSCOPY N/A 04/23/2017    Procedure: COLONOSCOPY;  Surgeon: Irene Shipper, MD;  Location: [facility];  Service: Endoscopy;  Laterality: N/A;   IR FLUORO GUIDE CV LINE RIGHT  12/17/2016   IR FLUORO GUIDE CV LINE RIGHT  02/07/2021   IR RADIOLOGIST EVAL & MGMT  10/02/2018   IR RADIOLOGIST EVAL & MGMT  10/15/2018   IR REMOVAL TUN CV CATH W/O FL  12/21/2016   IR REMOVAL TUN CV CATH W/O FL  02/11/2021   IR US GUIDE VASC ACCESS RIGHT  12/17/2016   IR US GUIDE VASC ACCESS RIGHT  02/07/2021   spleenectomy       Social History:   Social History   Tobacco Use   Smoking status: Former    Packs/day: 0.10    Types: Cigarettes   Smokeless tobacco: Never  Substance Use Topics   Alcohol use: Yes    Comment: Once a month     Lives - At home   Family History :   Family History  Problem Relation Age of Onset   Breast cancer Mother      Home Medications:   Prior to Admission medications   Medication Sig Start Date End Date Taking? Authorizing Provider  amitriptyline (ELAVIL) 25 MG tablet Take 50 mg by mouth at bedtime. 03/08/21   [provider]  calcitRIOL (ROCALTROL) 0.25 MCG capsule Take 0.25 mcg by mouth daily with breakfast. 11/16/20   [provider]  Deferiprone, Twice Daily, (FERRIPROX TWICE-A-DAY) 1000 MG TABS Take 1,000 mg by mouth 2 (two) times daily.    [provider]  folic acid (FOLVITE) 1 MG tablet TAKE 1 TABLET BY MOUTH EVERY DAY Patient taking differently: Take 1 mg by mouth every morning. 03/22/20   [name], FNP  hydroxyurea (HYDREA) 500 MG capsule Take 500-1,000 mg by mouth See admin instructions. Take 2 capsules (1000 mg) by mouth on Tuesday and Thursday mornings, take 1 capsule (500 mg) on all other mornings 05/07/20   [provider]  oxyCODONE (OXYCONTIN) 15 mg 12 hr tablet Take 1 tablet (15 mg total) by mouth every 12 (twelve) hours. 09/10/21   [name], FNP  Oxycodone HCl 10 MG TABS Take 1 tablet by mouth every 6 hours as needed. 08/26/21   [name], FNP  patiromer (VELTASSA) 8.4 g packet Take 8.4 g by mouth daily.    [provider]  sodium bicarbonate 650 MG tablet Take 1,950 mg by mouth 3 (three) times daily with meals. 11/07/18   [provider]  voxelotor (OXBRYTA) 500 MG TABS tablet Take 1,500 mg by mouth daily with breakfast.    [provider]     Allergies:   Allergies  Allergen Reactions   Nsaids Other (See Comments)    Kidney disease   Morphine And Related Other (See Comments)    "Real bad  headaches"      Physical Exam:   Vitals:   Vitals:   09/17/21 1100 09/17/21 1126  BP: 102/79   Pulse: 98   Resp: (!) 9   Temp:  (!) 97.4 F (36.3 C)  SpO2: 97%     Physical Exam: Constitutional: Patient appears well-developed and well-nourished. Not in obvious distress. HENT: Normocephalic, atraumatic, External right and left ear normal. Oropharynx is clear and moist.  Eyes: Conjunctivae and EOM are normal. PERRLA, no scleral icterus. Neck: Normal ROM. Neck supple. No JVD. No tracheal deviation. No thyromegaly. CVS: RRR, S1/S2 +, no murmurs, no gallops, no carotid bruit.  Pulmonary: Effort and breath sounds normal, no stridor, rhonchi, wheezes, rales.  Abdominal: Soft. BS +, no distension, tenderness, rebound or guarding.  Musculoskeletal: Normal range of motion. No edema and no tenderness.  Lymphadenopathy: No lymphadenopathy noted, cervical, inguinal or axillary Neuro: Alert. Normal reflexes, muscle tone coordination. No cranial nerve deficit. Skin: Skin is warm and dry. No rash noted. Not diaphoretic. No erythema. No pallor. Psychiatric: Normal mood and affect. Behavior, judgment, thought content normal.   Data Review:   CBC Recent Labs  Lab 09/17/21 0806  WBC 15.8*  HGB 7.3*  HCT 19.6*  PLT 150  MCV 109.5*  MCH 40.8*  MCHC 37.2*  RDW 21.9*  LYMPHSABS 5.8*  MONOABS 0.3  EOSABS 0.9*  BASOSABS 0.0    ------------------------------------------------------------------------------------------------------------------  Chemistries  Recent Labs  Lab 09/17/21 0806 09/17/21 0825  NA 138  --   K 4.7  --   CL 112*  --   CO2 16*  --   GLUCOSE 168*  --   BUN 80*  --   CREATININE 4.14*  --   CALCIUM 9.0  --   MG  --  2.6*  AST 65*  --   ALT 65*  --   ALKPHOS 231*  --   BILITOT 1.4*  --    ------------------------------------------------------------------------------------------------------------------ estimated creatinine clearance is 19.4 mL/min (A) (by C-G formula based on SCr of 4.14 mg/dL (H)). ------------------------------------------------------------------------------------------------------------------ No results for input(s): "TSH", "T4TOTAL", "T3FREE", "THYROIDAB" in the last 72 hours.  Invalid input(s): "FREET3"  Coagulation profile No results for input(s): "INR", "PROTIME" in the last 168 hours. ------------------------------------------------------------------------------------------------------------------- No results for input(s): "DDIMER" in the last 72 hours. -------------------------------------------------------------------------------------------------------------------  Cardiac Enzymes No results for input(s): "CKMB", "TROPONINI", "MYOGLOBIN" in the last 168 hours.  Invalid input(s): "CK" ------------------------------------------------------------------------------------------------------------------ No results found for: "BNP"  ---------------------------------------------------------------------------------------------------------------  Urinalysis    Component Value Date/Time   COLORURINE AMBER (A) 07/30/2021 [place] 07/30/2021 0747   LABSPEC 1.013 07/30/2021 0747   PHURINE 6.0 07/30/2021 0747   GLUCOSEU NEGATIVE 07/30/2021 0747   HGBUR MODERATE (A) 07/30/2021 0747   HGBUR small 03/10/2010 1445   BILIRUBINUR NEGATIVE  07/30/2021 0747   BILIRUBINUR negative 04/19/2021 1217   BILIRUBINUR neg 09/30/2020 1117   KETONESUR NEGATIVE 07/30/2021 0747   PROTEINUR 100 (A) 07/30/2021 0747   UROBILINOGEN 0.2 04/19/2021 1217   UROBILINOGEN 0.2 05/11/2017 1335   NITRITE NEGATIVE 07/30/2021 0747   LEUKOCYTESUR NEGATIVE 07/30/2021 0747    ----------------------------------------------------------------------------------------------------------------   Imaging Results:    DG Lumbar Spine 2-3 Views  Result Date: 09/17/2021 CLINICAL DATA:  Pt endorses back pain and sickle cell crisis since yesterday. Reports nausea and emesis x2 yesterday. Unable to keep anything down for 24 hours. EXAM: LUMBAR SPINE - 2-3 VIEW COMPARISON:  01/21/2018 FINDINGS: No fracture, bone lesion or spondylolisthesis. Disc spaces relatively well maintained. Minor anterior endplate spurring  scratch minor anterior endplate spurring. Trabecular prominence diffusely consistent with the patient's sickle cell disease. 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 IMPRESSION: 1. No fracture or acute finding.  No malalignment. Electronically Signed   By: Lajean Manes M.D.   On: 09/17/2021 09:56   DG Chest Port 1 View  Result Date: 09/17/2021 CLINICAL DATA:  Sickle cell crisis.  Back pain. EXAM: PORTABLE CHEST 1 VIEW COMPARISON:  July 30, 2021 FINDINGS: Sickle cell changes to the bones with scattered sclerotic change The heart, hila, and mediastinum are normal. No pneumothorax. No nodules or masses. No focal infiltrates. IMPRESSION: Sickle cell changes of the bones with scattered sclerosis, stable. No acute abnormalities identified within the lungs. Electronically Signed   By: Dorise Bullion III M.D.   On: 09/17/2021 09:54     Assessment & Plan:  Principal Problem:   Sickle cell anemia with crisis [facility]) Active Problems:   Acute kidney injury superimposed on chronic kidney disease (HCC)   Abnormal liver function   Chronic pain syndrome   History of  cocaine use  Hb Sickle Cell Disease with crisis: Admit patient, start IVF 0.45% Saline @ 100 mls/hour, start weight based Dilaudid PCA, IV Toradol is contraindicated due to CKD, Restart oral home pain medications, Monitor vitals very closely, Re-evaluate pain scale regularly, 2 L of Oxygen by [place], Patient will be re-evaluated for pain in the context of function and relationship to baseline as care progresses. Leukocytosis: This is chronic.  Patient is afebrile and no other symptoms or sign of infection or inflammation.  This may be due to reaction to sickle cell crisis, will continue to monitor very closely.  For now, there is no clinical indication for antibiotics. Anemia of Chronic Disease: Hemoglobin is 7.3 today, there may be some hemoconcentration going on based on BUN of 80.  We will gently hydrate, recheck hemoglobin in a.m. and will transfuse if hemoglobin drops below 6. Chronic pain Syndrome: Restart and continue home pain medications. Acute kidney injury superimposed on CKD stage IV: Creatinine is 4.14 today, above his baseline of between 2 and 3.  Gentle hydration as ordered.  Avoid all nephrotoxins.  Patient encouraged to follow-up with nephrology when discharged.  DVT Prophylaxis: Subcut Lovenox   AM Labs Ordered, also please review Full Orders  Family Communication: Admission, patient's condition and plan of care including tests being ordered have been discussed with the patient who indicate understanding and agree with the plan and Code Status.  Code Status: Full Code  Consults called: None    Admission status: Inpatient    Time spent in minutes : 50 minutes  Angelica Chessman MD, MHA, CPE, FACP 09/17/2021 at 11:29 AM

## 2021-09-17 NOTE — ED Triage Notes (Signed)
Pt endorses back pain and sickle cell crisis since yesterday. Reports nausea and emesis x2 yesterday. Unable to keep anything down for 24 hours.

## 2021-09-17 NOTE — Plan of Care (Signed)
  Problem: Self-Care: Goal: Ability to incorporate actions that prevent/reduce pain crisis will improve Outcome: Progressing   Problem: Tissue Perfusion: Goal: Complications related to inadequate tissue perfusion will be avoided or minimized Outcome: Progressing   Problem: Education: Goal: Knowledge of General Education information will improve Description: Including pain rating scale, medication(s)/side effects and non-pharmacologic comfort measures Outcome: Progressing   Problem: Health Behavior: Goal: Postive changes in compliance with treatment and prescription regimens will improve Outcome: Progressing   Problem: Clinical Measurements: Goal: Cardiovascular complication will be avoided Outcome: Progressing   Problem: Clinical Measurements: Goal: Respiratory complications will improve Outcome: Progressing   Problem: Pain Managment: Goal: General experience of comfort will improve Outcome: Progressing

## 2021-09-17 NOTE — ED Provider Notes (Signed)
[facility] DEPT Provider Note   CSN: [id number] Arrival date & time: 09/17/21  0734     History  Chief Complaint  Patient presents with   Sickle Cell Pain Crisis    Madden Garron. is a 41 y.o. male.   Sickle Cell Pain Crisis Associated symptoms: nausea and vomiting    Patient presents for low back pain.  Medical history includes sickle cell anemia, CKD, polysubstance abuse.  His pain has been present and worsening since yesterday.  He states that it is typical for his sickle cell pain crises.  He was taking his home pain medications.  Since last night, he has had nausea, vomiting, and p.o. intolerance.  Because of this, he has not had any pain medicine since 8 PM.  He endorses continued pain, currently 10/10 in severity, as well as nausea.  He denies any shortness of breath or chest pain.      Home Medications Prior to Admission medications   Medication Sig Start Date End Date Taking? Authorizing Provider  amitriptyline (ELAVIL) 25 MG tablet Take 50 mg by mouth at bedtime. 03/08/21   [provider]  calcitRIOL (ROCALTROL) 0.25 MCG capsule Take 0.25 mcg by mouth daily with breakfast. 11/16/20   [provider]  Deferiprone, Twice Daily, (FERRIPROX TWICE-A-DAY) 1000 MG TABS Take 1,000 mg by mouth 2 (two) times daily.    [provider]  folic acid (FOLVITE) 1 MG tablet TAKE 1 TABLET BY MOUTH EVERY DAY Patient taking differently: Take 1 mg by mouth every morning. 03/22/20   [name], FNP  hydroxyurea (HYDREA) 500 MG capsule Take 500-1,000 mg by mouth See admin instructions. Take 2 capsules (1000 mg) by mouth on Tuesday and Thursday mornings, take 1 capsule (500 mg) on all other mornings 05/07/20   [provider]  oxyCODONE (OXYCONTIN) 15 mg 12 hr tablet Take 1 tablet (15 mg total) by mouth every 12 (twelve) hours. 09/10/21   [name], FNP  Oxycodone HCl 10 MG TABS Take 1 tablet by mouth every 6  hours as needed. 08/26/21   [name], FNP  patiromer (VELTASSA) 8.4 g packet Take 8.4 g by mouth daily.    [provider]  sodium bicarbonate 650 MG tablet Take 1,950 mg by mouth 3 (three) times daily with meals. 11/07/18   [provider]  voxelotor (OXBRYTA) 500 MG TABS tablet Take 1,500 mg by mouth daily with breakfast.    [provider]      Allergies    Nsaids and Morphine and related    Review of Systems   Review of Systems  Gastrointestinal:  Positive for nausea and vomiting.  Genitourinary:  Positive for decreased urine volume.  Musculoskeletal:  Positive for back pain.  All other systems reviewed and are negative.   Physical Exam Updated Vital Signs BP 107/79   Pulse 93   Temp (!) 97.4 F (36.3 C) (Oral)   Resp 11   Ht 5\' 3"  (1.6 m)   Wt 56.7 kg   SpO2 96%   BMI 22.14 kg/m  Physical Exam Vitals and nursing note reviewed.  Constitutional:      General: He is not in acute distress.    Appearance: Normal appearance. He is well-developed. He is not ill-appearing, toxic-appearing or diaphoretic.  HENT:     Head: Normocephalic and atraumatic.     Right Ear: External ear normal.     Left Ear: External ear normal.  Nose: Nose normal.     Mouth/Throat:     Mouth: Mucous membranes are moist.     Pharynx: Oropharynx is clear.  Eyes:     Extraocular Movements: Extraocular movements intact.     Conjunctiva/sclera: Conjunctivae normal.  Cardiovascular:     Rate and Rhythm: Normal rate and regular rhythm.  Pulmonary:     Effort: Pulmonary effort is normal. No respiratory distress.     Breath sounds: Normal breath sounds. No wheezing or rales.  Chest:     Chest wall: No tenderness.  Abdominal:     General: There is no distension.     Palpations: Abdomen is soft.     Tenderness: There is no abdominal tenderness.  Musculoskeletal:        General: Tenderness (Musculature of mid/lower back) present. No swelling. Normal range of  motion.     Cervical back: Normal range of motion and neck supple.  Skin:    General: Skin is warm and dry.     Coloration: Skin is not jaundiced or pale.  Neurological:     General: No focal deficit present.     Mental Status: He is alert and oriented to person, place, and time.     Cranial Nerves: No cranial nerve deficit.     Sensory: No sensory deficit.     Motor: No weakness.     Coordination: Coordination normal.  Psychiatric:        Mood and Affect: Mood normal.        Behavior: Behavior normal.        Thought Content: Thought content normal.        Judgment: Judgment normal.     ED Results / Procedures / Treatments   Labs (all labs ordered are listed, but only abnormal results are displayed) Labs Reviewed  COMPREHENSIVE METABOLIC PANEL - Abnormal; Notable for the following components:      Result Value   Chloride 112 (*)    CO2 16 (*)    Glucose, Bld 168 (*)    BUN 80 (*)    Creatinine, Ser 4.14 (*)    AST 65 (*)    ALT 65 (*)    Alkaline Phosphatase 231 (*)    Total Bilirubin 1.4 (*)    GFR, Estimated 18 (*)    All other components within normal limits  CBC WITH DIFFERENTIAL/PLATELET - Abnormal; Notable for the following components:   WBC 15.8 (*)    RBC 1.79 (*)    Hemoglobin 7.3 (*)    HCT 19.6 (*)    MCV 109.5 (*)    MCH 40.8 (*)    MCHC 37.2 (*)    RDW 21.9 (*)    nRBC 0.3 (*)    Neutro Abs 8.7 (*)    Lymphs Abs 5.8 (*)    Eosinophils Absolute 0.9 (*)    All other components within normal limits  RETICULOCYTES - Abnormal; Notable for the following components:   Retic Ct Pct 7.3 (*)    RBC. 1.80 (*)    Immature Retic Fract 21.5 (*)    All other components within normal limits  CK - Abnormal; Notable for the following components:   Total CK 28 (*)    All other components within normal limits  MAGNESIUM - Abnormal; Notable for the following components:   Magnesium 2.6 (*)    All other components within normal limits  URINALYSIS, ROUTINE W REFLEX  MICROSCOPIC  TROPONIN I (HIGH SENSITIVITY)  TROPONIN I (HIGH SENSITIVITY)  EKG None  Radiology DG Lumbar Spine 2-3 Views  Result Date: 09/17/2021 CLINICAL DATA:  Pt endorses back pain and sickle cell crisis since yesterday. Reports nausea and emesis x2 yesterday. Unable to keep anything down for 24 hours. EXAM: LUMBAR SPINE - 2-3 VIEW COMPARISON:  01/21/2018 FINDINGS: No fracture, bone lesion or spondylolisthesis. Disc spaces relatively well maintained. Minor anterior endplate spurring scratch minor anterior endplate spurring. Trabecular prominence diffusely consistent with the patient's sickle cell disease. 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 IMPRESSION: 1. No fracture or acute finding.  No malalignment. Electronically Signed   By: Lajean Manes M.D.   On: 09/17/2021 09:56   DG Chest Port 1 View  Result Date: 09/17/2021 CLINICAL DATA:  Sickle cell crisis.  Back pain. EXAM: PORTABLE CHEST 1 VIEW COMPARISON:  July 30, 2021 FINDINGS: Sickle cell changes to the bones with scattered sclerotic change The heart, hila, and mediastinum are normal. No pneumothorax. No nodules or masses. No focal infiltrates. IMPRESSION: Sickle cell changes of the bones with scattered sclerosis, stable. No acute abnormalities identified within the lungs. Electronically Signed   By: Dorise Bullion III M.D.   On: 09/17/2021 09:54    Procedures Procedures    Medications Ordered in ED Medications  dextrose 5 %-0.45 % sodium chloride infusion ( Intravenous New Bag/Given 09/17/21 0836)  diphenhydrAMINE (BENADRYL) capsule 25-50 mg (has no administration in time range)  ondansetron (ZOFRAN) injection 4 mg (has no administration in time range)  diphenhydrAMINE (BENADRYL) 12.5 mg in sodium chloride 0.9 % 50 mL IVPB (0 mg Intravenous Stopped 09/17/21 0956)  ondansetron (ZOFRAN) injection 4 mg (4 mg Intravenous Given 09/17/21 0833)  HYDROmorphone (DILAUDID) injection 2 mg (2 mg Subcutaneous Given 09/17/21 0841)   HYDROmorphone (DILAUDID) injection 2 mg (2 mg Subcutaneous Given 09/17/21 0926)  HYDROmorphone (DILAUDID) injection 2 mg (2 mg Subcutaneous Given 09/17/21 0957)  sodium chloride 0.9 % bolus 500 mL (0 mLs Intravenous Stopped 09/17/21 0955)  sodium chloride 0.9 % bolus 500 mL (500 mLs Intravenous New Bag/Given 09/17/21 1109)  HYDROmorphone (DILAUDID) injection 1 mg (1 mg Intravenous Given 09/17/21 1109)    ED Course/ Medical Decision Making/ A&P                           Medical Decision Making Amount and/or Complexity of Data Reviewed Labs: ordered. Radiology: ordered.  Risk Prescription drug management. Decision regarding hospitalization.   This patient presents to the ED for concern of back pain, this involves an extensive number of treatment options, and is a complaint that carries with it a high risk of complications and morbidity.  The differential diagnosis includes sickle cell pain crisis, chronic pain, opiate withdrawal, occult injury, pyelonephritis, myositis   Co morbidities that complicate the patient evaluation  sickle cell anemia, CKD, polysubstance abuse   Additional history obtained:  Additional history obtained from N/A External records from outside source obtained and reviewed including EMR   Lab Tests:  I Ordered, and personally interpreted labs.  The pertinent results include: Anemia and leukocytosis, increased reticulocytes.  Creatinine is increased from baseline with increased BUN suggestive of prerenal etiology Metabolic acidosis is present.  Magnesium is elevated with otherwise normal electrolytes   Imaging Studies ordered:  I ordered imaging studies including x-ray of chest and lumbar spine I independently visualized and interpreted imaging which showed no acute findings I agree with the radiologist interpretation   Cardiac Monitoring: / EKG:  The patient was maintained on a  cardiac monitor.  I personally viewed and interpreted the cardiac monitored  which showed an underlying rhythm of: Sinus rhythm   Problem List / ED Course / Critical interventions / Medication management  Patient is a 41 year old male presenting for low back pain consistent with prior episodes of sickle cell pain.  On arrival in the ED, he is afebrile with slightly elevated heart rate and normal blood pressure.  On initial assessment, patient is well-appearing.  His lungs are clear to auscultation.  He has no focal neurologic deficits.  He does have tenderness to palpation throughout his lower back.  Patient was given Zofran for nausea.  Given his recent vomiting and p.o. intolerance, bolus of IV fluids was ordered.  Patient was started on maintenance IVF.  Laboratory work-up was initiated.  Lab results notable for creatinine elevated from baseline with azotemia suggesting prerenal etiology.  Patient was given additional bolus of IV fluids.  During this time, patient received multiple doses of subcutaneous Dilaudid for pain control.  Following 3 doses, patient reports continued pain, currently 8-9/10 in severity.  Given his AKI and continued pain despite pain control in the ED, patient was admitted for further management. I ordered medication including IV fluids for dehydration; Zofran for nausea; Dilaudid for analgesia Reevaluation of the patient after these medicines showed that the patient improved I have reviewed the patients home medicines and have made adjustments as needed   Social Determinants of Health:  Followed by [facility] hematology for sickle cell disease         Final Clinical Impression(s) / ED Diagnoses Final diagnoses:  Sickle cell anemia with pain ([place])  AKI (acute kidney injury) [facility])    Rx / DC Orders ED Discharge Orders     None         Godfrey Pick, MD 09/17/21 1217

## 2021-09-17 NOTE — Progress Notes (Signed)
Pt stable on arrival to floor. No needs on arrival to floor. Assessment and vital signs performed. Rn will continue to monitor.

## 2021-09-17 NOTE — Progress Notes (Signed)
Rn had pharmacy verify pt [name] [name] and Ferriprox from pt home supply. Pt does not want meds stored in pharmacy and wants to keep them at the bedside. Pharmacy staff successfully verified these medications and they were returned to pt.

## 2021-09-18 DIAGNOSIS — R945 Abnormal results of liver function studies: Secondary | ICD-10-CM

## 2021-09-18 DIAGNOSIS — F1491 Cocaine use, unspecified, in remission: Secondary | ICD-10-CM

## 2021-09-18 DIAGNOSIS — G894 Chronic pain syndrome: Secondary | ICD-10-CM

## 2021-09-18 DIAGNOSIS — N179 Acute kidney failure, unspecified: Secondary | ICD-10-CM

## 2021-09-18 DIAGNOSIS — N189 Chronic kidney disease, unspecified: Secondary | ICD-10-CM

## 2021-09-18 LAB — BASIC METABOLIC PANEL
Anion gap: 6 (ref 5–15)
BUN: 58 mg/dL — ABNORMAL HIGH (ref 6–20)
CO2: 16 mmol/L — ABNORMAL LOW (ref 22–32)
Calcium: 8.6 mg/dL — ABNORMAL LOW (ref 8.9–10.3)
Chloride: 114 mmol/L — ABNORMAL HIGH (ref 98–111)
Creatinine, Ser: 3.05 mg/dL — ABNORMAL HIGH (ref 0.61–1.24)
GFR, Estimated: 26 mL/min — ABNORMAL LOW (ref >60)
Glucose, Bld: 97 mg/dL (ref 70–99)
Potassium: 4.9 mmol/L (ref 3.5–5.1)
Sodium: 136 mmol/L (ref 135–145)

## 2021-09-18 MED ORDER — ACETAMINOPHEN 325 MG PO TABS: 650.0000 mg | ORAL_TABLET | Freq: Four times a day (QID) | ORAL | Status: DC | PRN

## 2021-09-18 NOTE — Progress Notes (Signed)
Patient ID: [name] Pick., male   DOB: 1980-03-11, 41 y.o.   MRN: [id number] Subjective: [name] [name] is a 41 year old male with a medical history significant for sickle cell disease type SS, chronic pain syndrome, opiate dependence and tolerance, anemia of chronic disease, stage IV chronic kidney disease, and history of polysubstance abuse was admitted for sickle cell pain crisis.  Patient claims his pain is improving but has developed some headache this morning which is similar to his usual headache went on Dilaudid.  He said this headache usually responds to Tylenol.  He said his low back pain is less than yesterday, he now rates it at 6/10.  He denies any fever, chest pain, cough, shortness of breath, nausea, vomiting or diarrhea.  No urinary symptoms.  Objective:  Vital signs in last 24 hours:  Vitals:   09/18/21 0337 09/18/21 0845 09/18/21 1021 09/18/21 1316  BP: 101/74  99/72   Pulse: 89  99   Resp: 18 13 16    Temp: 98.6 F (37 C)  98.1 F (36.7 C)   TempSrc: Oral  Oral   SpO2: 100% 100% 99% 100%  Weight:      Height:        Intake/Output from previous day:   Intake/Output Summary (Last 24 hours) at 09/18/2021 1356 Last data filed at 09/18/2021 1021 Gross per 24 hour  Intake 3293.98 ml  Output 2800 ml  Net 493.98 ml    Physical Exam: General: Alert, awake, oriented x3, in no acute distress.  HEENT: [place]/AT PEERL, EOMI Neck: Trachea midline,  no masses, no thyromegal,y no JVD, no carotid bruit OROPHARYNX:  Moist, No exudate/ erythema/lesions.  Heart: Regular rate and rhythm, without murmurs, rubs, gallops, PMI non-displaced, no heaves or thrills on palpation.  Lungs: Clear to auscultation, no wheezing or rhonchi noted. No increased vocal fremitus resonant to percussion  Abdomen: Soft, nontender, nondistended, positive bowel sounds, no masses no hepatosplenomegaly noted..  Neuro: No focal neurological deficits noted cranial nerves II through XII grossly intact.  DTRs 2+ bilaterally upper and lower extremities. Strength 5 out of 5 in bilateral upper and lower extremities. Musculoskeletal: No warm swelling or erythema around joints, no spinal tenderness noted. Psychiatric: Patient alert and oriented x3, good insight and cognition, good recent to remote recall. Lymph node survey: No cervical axillary or inguinal lymphadenopathy noted.  Lab Results:  Basic Metabolic Panel:    Component Value Date/Time   NA 136 09/18/2021 0621   NA 133 (L) 07/26/2021 1047   K 4.9 09/18/2021 0621   CL 114 (H) 09/18/2021 0621   CO2 16 (L) 09/18/2021 0621   BUN 58 (H) 09/18/2021 0621   BUN 57 (H) 07/26/2021 1047   CREATININE 3.05 (H) 09/18/2021 0621   CREATININE 1.45 (H) 05/12/2014 1430   GLUCOSE 97 09/18/2021 0621   CALCIUM 8.6 (L) 09/18/2021 0621   CBC:    Component Value Date/Time   WBC 15.8 (H) 09/17/2021 0806   HGB 7.3 (L) 09/17/2021 0806   HGB 6.6 (LL) 07/26/2021 1047   HCT 19.6 (L) 09/17/2021 0806   HCT 18.0 (LL) 07/26/2021 1047   PLT 150 09/17/2021 0806   PLT 244 07/26/2021 1047   MCV 109.5 (H) 09/17/2021 0806   MCV 100 (H) 07/26/2021 1047   NEUTROABS 8.7 (H) 09/17/2021 0806   NEUTROABS 10.1 (H) 07/26/2021 1047   LYMPHSABS 5.8 (H) 09/17/2021 0806   LYMPHSABS 8.9 (H) 07/26/2021 1047   MONOABS 0.3 09/17/2021 0806   EOSABS 0.9 (H) 09/17/2021 5284  EOSABS 0.5 (H) 07/26/2021 1047   BASOSABS 0.0 09/17/2021 0806   BASOSABS 0.1 07/26/2021 1047    No results found for this or any previous visit (from the past 240 hour(s)).  Studies/Results: DG Lumbar Spine 2-3 Views  Result Date: 09/17/2021 CLINICAL DATA:  Pt endorses back pain and sickle cell crisis since yesterday. Reports nausea and emesis x2 yesterday. Unable to keep anything down for 24 hours. EXAM: LUMBAR SPINE - 2-3 VIEW COMPARISON:  01/21/2018 FINDINGS: No fracture, bone lesion or spondylolisthesis. Disc spaces relatively well maintained. Minor anterior endplate spurring scratch minor  anterior endplate spurring. Trabecular prominence diffusely consistent with the patient's sickle cell disease. 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 IMPRESSION: 1. No fracture or acute finding.  No malalignment. Electronically Signed   By: Amie Portland M.D.   On: 09/17/2021 09:56   DG Chest Port 1 View  Result Date: 09/17/2021 CLINICAL DATA:  Sickle cell crisis.  Back pain. EXAM: PORTABLE CHEST 1 VIEW COMPARISON:  July 30, 2021 FINDINGS: Sickle cell changes to the bones with scattered sclerotic change The heart, hila, and mediastinum are normal. No pneumothorax. No nodules or masses. No focal infiltrates. IMPRESSION: Sickle cell changes of the bones with scattered sclerosis, stable. No acute abnormalities identified within the lungs. Electronically Signed   By: Gerome Sam III M.D.   On: 09/17/2021 09:54    Medications: Scheduled Meds:  amitriptyline  50 mg Oral QHS   Deferiprone (Twice Daily)  1,000 mg Oral BID   enoxaparin (LOVENOX) injection  30 mg Subcutaneous Q24H   folic acid  1 mg Oral Daily   HYDROmorphone   Intravenous Q4H   [START ON 09/20/2021] hydroxyurea  1,000 mg Oral Once per day on Tue Thu   hydroxyurea  500 mg Oral Once per day on Sun Mon Wed Fri Sat   oxyCODONE  15 mg Oral Q12H   patiromer  8.4 g Oral Daily   senna-docusate  1 tablet Oral BID   sodium bicarbonate  1,950 mg Oral TID WC   voxelotor  1,500 mg Oral Q breakfast   Continuous Infusions:  sodium chloride 50 mL/hr at 09/18/21 1221   PRN Meds:.acetaminophen, diphenhydrAMINE, naloxone **AND** sodium chloride flush, ondansetron (ZOFRAN) IV, oxyCODONE, polyethylene glycol  Consultants: None  Procedures: None  Antibiotics: None  Assessment/Plan: Principal Problem:   Sickle cell anemia with crisis (HCC) Active Problems:   Acute kidney injury superimposed on chronic kidney disease (HCC)   Abnormal liver function   Chronic pain syndrome   History of cocaine use  Hb Sickle Cell Disease with  Pain crisis: Reduce IVF 0.45% Saline to 50 mls/hour, continue weight based Dilaudid PCA at current dose setting, continue oral home pain medications as ordered.  IV Toradol is contraindicated due to AKI on top of CKD.  Monitor vitals very closely, Re-evaluate pain scale regularly, 2 L of Oxygen by [place]. Leukocytosis: Improving.  No sign of infections or inflammations.  We will continue to monitor closely without antibiotics. Anemia of Chronic Disease: Hemoglobin remained stable at baseline.  There is no clinical indication for blood transfusion at this time.  We will continue to monitor very closely and will transfuse as appropriate. Chronic pain Syndrome: Continue home pain medications as ordered. AKI superimposed on CKD stage IV: Serum creatinine baseline is between 2.0 and 3.0.  Serum creatinine on admission was 4.14, today is 3.05 responding to gentle hydration.  We will continue to avoid all nephrotoxins.  Continue gentle hydration at 50 cc/h.  BUN now down to 58 from 80. History of polysubstance use disorder: UDS negative for cocaine or benzo but positive for tetrahydrocannabinol and opiates.  Patient counseled extensively.  We will continue to monitor closely.  Code Status: Full Code Family Communication: N/A Disposition Plan: Not yet ready for discharge  Shaquera Ansley  If 7PM-7AM, please contact night-coverage.  09/18/2021, 1:56 PM  LOS: 1 day

## 2021-09-19 ENCOUNTER — Other Ambulatory Visit (HOSPITAL_COMMUNITY): Payer: Self-pay

## 2021-09-19 DIAGNOSIS — D5700 Hb-SS disease with crisis, unspecified: Secondary | ICD-10-CM | POA: Diagnosis not present

## 2021-09-19 LAB — CBC WITH DIFFERENTIAL/PLATELET
Abs Immature Granulocytes: 0.21 10*3/uL — ABNORMAL HIGH (ref 0.00–0.07)
Basophils Absolute: 0.1 10*3/uL (ref 0.0–0.1)
Basophils Relative: 0 %
Eosinophils Absolute: 0.7 10*3/uL — ABNORMAL HIGH (ref 0.0–0.5)
Eosinophils Relative: 4 %
HCT: 19.3 % — ABNORMAL LOW (ref 39.0–52.0)
Hemoglobin: 7.2 g/dL — ABNORMAL LOW (ref 13.0–17.0)
Immature Granulocytes: 1 %
Lymphocytes Relative: 57 %
Lymphs Abs: 9.5 10*3/uL — ABNORMAL HIGH (ref 0.7–4.0)
MCH: 40.4 pg — ABNORMAL HIGH (ref 26.0–34.0)
MCHC: 37.3 g/dL — ABNORMAL HIGH (ref 30.0–36.0)
MCV: 108.4 fL — ABNORMAL HIGH (ref 80.0–100.0)
Monocytes Absolute: 0.7 10*3/uL (ref 0.1–1.0)
Monocytes Relative: 4 %
Neutro Abs: 5.7 10*3/uL (ref 1.7–7.7)
Neutrophils Relative %: 34 %
Platelets: 135 10*3/uL — ABNORMAL LOW (ref 150–400)
RBC: 1.78 MIL/uL — ABNORMAL LOW (ref 4.22–5.81)
RDW: 22 % — ABNORMAL HIGH (ref 11.5–15.5)
WBC: 16.9 10*3/uL — ABNORMAL HIGH (ref 4.0–10.5)
nRBC: 0.4 % — ABNORMAL HIGH (ref 0.0–0.2)

## 2021-09-19 LAB — BASIC METABOLIC PANEL
Anion gap: 7 (ref 5–15)
BUN: 46 mg/dL — ABNORMAL HIGH (ref 6–20)
CO2: 16 mmol/L — ABNORMAL LOW (ref 22–32)
Calcium: 8.5 mg/dL — ABNORMAL LOW (ref 8.9–10.3)
Chloride: 114 mmol/L — ABNORMAL HIGH (ref 98–111)
Creatinine, Ser: 2.81 mg/dL — ABNORMAL HIGH (ref 0.61–1.24)
GFR, Estimated: 29 mL/min — ABNORMAL LOW (ref >60)
Glucose, Bld: 91 mg/dL (ref 70–99)
Potassium: 4.8 mmol/L (ref 3.5–5.1)
Sodium: 137 mmol/L (ref 135–145)

## 2021-09-19 MED ORDER — OXYCODONE HCL 10 MG PO TABS
10.0000 mg | ORAL_TABLET | Freq: Four times a day (QID) | ORAL | 0 refills | Status: DC | PRN
  Filled 2021-09-19: qty 60, 15d supply, fill #0

## 2021-09-19 NOTE — Discharge Summary (Cosign Needed Addendum)
Physician Discharge Summary  [name] [name]. IOX:[id number] DOB: 05/30/1980 DOA: 09/17/2021  PCP: [name], FNP  Admit date: 09/17/2021  Discharge date: 09/19/2021  Discharge Diagnoses:  Principal Problem:   Sickle cell anemia with pain ([place]) Active Problems:   Acute kidney injury superimposed on chronic kidney disease ([place])   Abnormal liver function   Chronic pain syndrome   History of cocaine use   Discharge Condition: Stable  Disposition:   Follow-up Information     [name], FNP Follow up.   Specialty: Family Medicine Contact information: [place]. [place] [address] [phone]         Buford Dresser, MD .   Specialty: Cardiology Contact information: [address] [place] [address] [phone]                Pt is discharged home in good condition and is to follow up with [name], FNP this week to have labs evaluated. [name] A Bezio Jr. is instructed to increase activity slowly and balance with rest for the next few days, and use prescribed medication to complete treatment of pain  Diet: Regular Wt Readings from Last 3 Encounters:  09/17/21 56.7 kg  07/30/21 57.2 kg  07/26/21 56.8 kg    History of present illness:  [name] [name] is a 41 year old male with a medical history significant for sickle cell disease, chronic pain syndrome, opiate dependence and tolerance, chronic kidney disease stage IV, tobacco abuse, history of polysubstance abuse, and history of migraine headaches who presented to the emergency department today with major complaints of significant pain in his low back radiating to legs bilaterally, which is typical of his sickle cell pain crisis.  Patient claims the pain intensity is about 10/10 when he arrived to the emergency room,/10 after multiple doses of subcutaneous Dilaudid.  He characterizes pain as throbbing and achy and intense.  He took his home  medications today without any sustained relief.  He denies any history of trauma or falls.  No joint redness or swelling.  He denies any fever, cough, chest pain, shortness of breath, nausea, vomiting or diarrhea.  No urinary symptoms.  No sick contacts, recent travels, or known exposure to COVID-19.  ED Course:  Vital signs showed: BP 107/79, pulse 93, Temp 97.4, respirations, and oxygen saturation 96% on room air.  Hemoglobin was 7.3 g/dL, which is around patient's baseline.  White blood cell count 15.8, reticulocyte 7.3%, CMP showed worsening renal function with serum creatinine of 4.14 at his baseline is 2.6 with a BUN of 80.  Bicarbonate 16.  Patient received multiple doses of Dilaudid subcutaneous injection in the emergency room with no major relief.  Patient will be admitted to the hospital for further evaluation and management of sickle cell pain crisis.  Hospital Course:  Patient was admitted for sickle cell pain crisis and managed appropriately with IVF, IV Dilaudid via PCA as well as other adjunct therapies per sickle cell pain management protocols.  Increased IV Dilaudid PCA weaned appropriately and patient transition to his home medications.  Oxycodone 10 mg This exam was #60 sent to patient's pharmacy.  PDMP reviewed prior to prescribing opiate medications.  No inconsistencies noted.  Patient will continue to follow-up with his PCP for medication management.  His pain continues decreased to 4/10 and patient is requesting discharge home today.  Chronic kidney disease stage IV: On admission, patient's urine creatinine was 4.14.  Creatinine improved following fluid resuscitation.  Prior to discharge, creatinine is 2.9 which is slightly above his baseline.  Patient to continue to follow-up with his nephrology team at [facility].  Anemia of chronic disease: Today, patient's hemoglobin is 7.3 g/dL, consistent with his baseline.  No clinical indication for blood transfusion  during this admission.  Patient will continue to follow-up with his hematology team at [facility] hematology.  Patient is aware of all upcoming appointments.  He will follow-up with his PCP in 2 weeks.  Patient will discharge home in a hemodynamically stable condition.  [name] [name] will follow-up with PCP within 1 week of this discharge. [name] [name] was counseled extensively about nonpharmacologic means of pain management, patient verbalized understanding and was appreciative of  the care received during this admission.   We discussed the need for good hydration, monitoring of hydration status, avoidance of heat, cold, stress, and infection triggers. We discussed the need to be adherent with taking Hydrea and other home medications. Patient was reminded of the need to seek medical attention immediately if any symptom of bleeding, anemia, or infection occurs.  Discharge Exam: Vitals:   09/19/21 0552 09/19/21 0754  BP: 106/70   Pulse: 100   Resp: 16 14  Temp: 98.5 F (36.9 C)   SpO2: 99% 99%   Vitals:   09/19/21 0206 09/19/21 0420 09/19/21 0552 09/19/21 0754  BP: 109/70  106/70   Pulse: 93  100   Resp: 16 14 16 14   Temp: 98.7 F (37.1 C)  98.5 F (36.9 C)   TempSrc: Oral  Oral   SpO2: 100% 100% 99% 99%  Weight:      Height:        General appearance : Awake, alert, not in any distress. Speech Clear. Not toxic looking HEENT: Atraumatic and Normocephalic, pupils equally reactive to light and accomodation Neck: Supple, no JVD. No cervical lymphadenopathy.  Chest: Good air entry bilaterally, no added sounds  CVS: S1 S2 regular, no murmurs.  Abdomen: Bowel sounds present, Non tender and not distended with no gaurding, rigidity or rebound. Extremities: B/L Lower Ext shows no edema, both legs are warm to touch Neurology: Awake alert, and oriented X 3, CN II-XII intact, Non focal Skin: No Rash  Discharge Instructions  Discharge Instructions     Discharge patient   Complete by: As directed     Discharge disposition: 01-Home or Self Care   Discharge patient date: 09/19/2021      Allergies as of 09/19/2021       Reactions   Nsaids Other (See Comments)   Kidney disease   Morphine And Related Other (See Comments)   "Real bad headaches"         Medication List     TAKE these medications    amitriptyline 25 MG tablet Commonly known as: ELAVIL Take 50 mg by mouth at bedtime.   calcitRIOL 0.25 MCG capsule Commonly known as: ROCALTROL Take 0.25 mcg by mouth daily with breakfast.   Ferriprox Twice-A-Day 1000 MG Tabs Generic drug: Deferiprone (Twice Daily) Take 1,000 mg by mouth 2 (two) times daily.   folic acid 1 MG tablet Commonly known as: FOLVITE TAKE 1 TABLET BY MOUTH EVERY DAY What changed: when to take this   hydroxyurea 500 MG capsule Commonly known as: HYDREA Take 500-1,000 mg by mouth See admin instructions. Take 1000 mg by mouth on Tuesday and Thursday mornings, take 500 mg on all other mornings   Oxbryta 500 MG Tabs tablet Generic drug: voxelotor Take 1,500 mg by  mouth daily with breakfast.   OxyCONTIN 15 mg 12 hr tablet Generic drug: oxyCODONE Take 1 tablet (15 mg total) by mouth every 12 (twelve) hours.   Oxycodone HCl 10 MG Tabs Take 1 tablet (10 mg total) by mouth every 6 (six) hours as needed (pain).   sodium bicarbonate 650 MG tablet Take 1,950 mg by mouth 3 (three) times daily with meals.   Veltassa 8.4 g packet Generic drug: patiromer Take 8.4 g by mouth daily.        The results of significant diagnostics from this hospitalization (including imaging, microbiology, ancillary and laboratory) are listed below for reference.    Significant Diagnostic Studies: DG Lumbar Spine 2-3 Views  Result Date: 09/17/2021 CLINICAL DATA:  Pt endorses back pain and sickle cell crisis since yesterday. Reports nausea and emesis x2 yesterday. Unable to keep anything down for 24 hours. EXAM: LUMBAR SPINE - 2-3 VIEW COMPARISON:  01/21/2018 FINDINGS: No  fracture, bone lesion or spondylolisthesis. Disc spaces relatively well maintained. Minor anterior endplate spurring scratch minor anterior endplate spurring. Trabecular prominence diffusely consistent with the patient's sickle cell disease. 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 IMPRESSION: 1. No fracture or acute finding.  No malalignment. Electronically Signed   By: Lajean Manes M.D.   On: 09/17/2021 09:56   DG Chest Port 1 View  Result Date: 09/17/2021 CLINICAL DATA:  Sickle cell crisis.  Back pain. EXAM: PORTABLE CHEST 1 VIEW COMPARISON:  July 30, 2021 FINDINGS: Sickle cell changes to the bones with scattered sclerotic change The heart, hila, and mediastinum are normal. No pneumothorax. No nodules or masses. No focal infiltrates. IMPRESSION: Sickle cell changes of the bones with scattered sclerosis, stable. No acute abnormalities identified within the lungs. Electronically Signed   By: Dorise Bullion III M.D.   On: 09/17/2021 09:54    Microbiology: No results found for this or any previous visit (from the past 240 hour(s)).   Labs: Basic Metabolic Panel: Recent Labs  Lab 09/17/21 0806 09/17/21 0825 09/18/21 0621 09/19/21 0452  NA 138  --  136 137  K 4.7  --  4.9 4.8  CL 112*  --  114* 114*  CO2 16*  --  16* 16*  GLUCOSE 168*  --  97 91  BUN 80*  --  58* 46*  CREATININE 4.14*  --  3.05* 2.81*  CALCIUM 9.0  --  8.6* 8.5*  MG  --  2.6*  --   --    Liver Function Tests: Recent Labs  Lab 09/17/21 0806  AST 65*  ALT 65*  ALKPHOS 231*  BILITOT 1.4*  PROT 7.9  ALBUMIN 3.7   No results for input(s): "LIPASE", "AMYLASE" in the last 168 hours. No results for input(s): "AMMONIA" in the last 168 hours. CBC: Recent Labs  Lab 09/17/21 0806 09/19/21 0452  WBC 15.8* 16.9*  NEUTROABS 8.7* 5.7  HGB 7.3* 7.2*  HCT 19.6* 19.3*  MCV 109.5* 108.4*  PLT 150 135*   Cardiac Enzymes: Recent Labs  Lab 09/17/21 0825  CKTOTAL 28*   BNP: Invalid input(s):  "POCBNP" CBG: No results for input(s): "GLUCAP" in the last 168 hours.  Time coordinating discharge: 30 minutes  Signed:  [name]  APRN, MSN, FNP-C [facility] [address] [place], [place] [address] [phone]  [facility] 09/19/2021, 6:03 PM

## 2021-09-21 ENCOUNTER — Emergency Department (HOSPITAL_COMMUNITY): Payer: Medicare Other

## 2021-09-21 ENCOUNTER — Encounter (HOSPITAL_COMMUNITY): Payer: Self-pay

## 2021-09-21 ENCOUNTER — Inpatient Hospital Stay (HOSPITAL_COMMUNITY)
Admission: EM | Admit: 2021-09-21 | Discharge: 2021-09-25 | DRG: 812 | Disposition: A | Discharge status: Home / Self Care | Payer: Medicare Other | Attending: Internal Medicine | Admitting: Internal Medicine

## 2021-09-21 DIAGNOSIS — Z803 Family history of malignant neoplasm of breast: Secondary | ICD-10-CM | POA: Diagnosis not present

## 2021-09-21 DIAGNOSIS — K219 Gastro-esophageal reflux disease without esophagitis: Secondary | ICD-10-CM | POA: Diagnosis present

## 2021-09-21 DIAGNOSIS — Z886 Allergy status to analgesic agent status: Secondary | ICD-10-CM | POA: Diagnosis not present

## 2021-09-21 DIAGNOSIS — Z79899 Other long term (current) drug therapy: Secondary | ICD-10-CM

## 2021-09-21 DIAGNOSIS — D72829 Elevated white blood cell count, unspecified: Secondary | ICD-10-CM | POA: Diagnosis present

## 2021-09-21 DIAGNOSIS — N179 Acute kidney failure, unspecified: Secondary | ICD-10-CM | POA: Diagnosis present

## 2021-09-21 DIAGNOSIS — D5700 Hb-SS disease with crisis, unspecified: Secondary | ICD-10-CM | POA: Diagnosis not present

## 2021-09-21 DIAGNOSIS — N184 Chronic kidney disease, stage 4 (severe): Secondary | ICD-10-CM | POA: Diagnosis present

## 2021-09-21 DIAGNOSIS — F17210 Nicotine dependence, cigarettes, uncomplicated: Secondary | ICD-10-CM | POA: Diagnosis present

## 2021-09-21 DIAGNOSIS — F1123 Opioid dependence with withdrawal: Secondary | ICD-10-CM | POA: Diagnosis present

## 2021-09-21 DIAGNOSIS — G894 Chronic pain syndrome: Secondary | ICD-10-CM | POA: Diagnosis present

## 2021-09-21 DIAGNOSIS — G43909 Migraine, unspecified, not intractable, without status migrainosus: Secondary | ICD-10-CM | POA: Diagnosis present

## 2021-09-21 DIAGNOSIS — Z885 Allergy status to narcotic agent status: Secondary | ICD-10-CM

## 2021-09-21 DIAGNOSIS — E875 Hyperkalemia: Secondary | ICD-10-CM | POA: Diagnosis present

## 2021-09-21 DIAGNOSIS — R112 Nausea with vomiting, unspecified: Secondary | ICD-10-CM | POA: Diagnosis not present

## 2021-09-21 DIAGNOSIS — Z20822 Contact with and (suspected) exposure to covid-19: Secondary | ICD-10-CM | POA: Diagnosis present

## 2021-09-21 LAB — CBC WITH DIFFERENTIAL/PLATELET
Abs Immature Granulocytes: 0.13 10*3/uL — ABNORMAL HIGH (ref 0.00–0.07)
Basophils Absolute: 0 10*3/uL (ref 0.0–0.1)
Basophils Relative: 0 %
Eosinophils Absolute: 0.2 10*3/uL (ref 0.0–0.5)
Eosinophils Relative: 1 %
HCT: 17.9 % — ABNORMAL LOW (ref 39.0–52.0)
Hemoglobin: 6.4 g/dL — CL (ref 13.0–17.0)
Immature Granulocytes: 1 %
Lymphocytes Relative: 34 %
Lymphs Abs: 6.8 10*3/uL — ABNORMAL HIGH (ref 0.7–4.0)
MCH: 39.5 pg — ABNORMAL HIGH (ref 26.0–34.0)
MCHC: 35.8 g/dL (ref 30.0–36.0)
MCV: 110.5 fL — ABNORMAL HIGH (ref 80.0–100.0)
Monocytes Absolute: 1.5 10*3/uL — ABNORMAL HIGH (ref 0.1–1.0)
Monocytes Relative: 8 %
Neutro Abs: 11.3 10*3/uL — ABNORMAL HIGH (ref 1.7–7.7)
Neutrophils Relative %: 56 %
Platelets: 160 10*3/uL (ref 150–400)
RBC: 1.62 MIL/uL — ABNORMAL LOW (ref 4.22–5.81)
RDW: 24.5 % — ABNORMAL HIGH (ref 11.5–15.5)
WBC: 19.9 10*3/uL — ABNORMAL HIGH (ref 4.0–10.5)
nRBC: 0.5 % — ABNORMAL HIGH (ref 0.0–0.2)

## 2021-09-21 LAB — RETICULOCYTES
Immature Retic Fract: 30.4 % — ABNORMAL HIGH (ref 2.3–15.9)
RBC.: 1.6 MIL/uL — ABNORMAL LOW (ref 4.22–5.81)
Retic Count, Absolute: 145.3 10*3/uL (ref 19.0–186.0)
Retic Ct Pct: 9.1 % — ABNORMAL HIGH (ref 0.4–3.1)

## 2021-09-21 LAB — COMPREHENSIVE METABOLIC PANEL
ALT: 50 U/L — ABNORMAL HIGH (ref 0–44)
AST: 66 U/L — ABNORMAL HIGH (ref 15–41)
Albumin: 4.2 g/dL (ref 3.5–5.0)
Alkaline Phosphatase: 236 U/L — ABNORMAL HIGH (ref 38–126)
Anion gap: 10 (ref 5–15)
BUN: 56 mg/dL — ABNORMAL HIGH (ref 6–20)
CO2: 17 mmol/L — ABNORMAL LOW (ref 22–32)
Calcium: 9 mg/dL (ref 8.9–10.3)
Chloride: 105 mmol/L (ref 98–111)
Creatinine, Ser: 3.82 mg/dL — ABNORMAL HIGH (ref 0.61–1.24)
GFR, Estimated: 20 mL/min — ABNORMAL LOW (ref >60)
Glucose, Bld: 94 mg/dL (ref 70–99)
Potassium: 5.6 mmol/L — ABNORMAL HIGH (ref 3.5–5.1)
Sodium: 132 mmol/L — ABNORMAL LOW (ref 135–145)
Total Bilirubin: 1.8 mg/dL — ABNORMAL HIGH (ref 0.3–1.2)
Total Protein: 8.6 g/dL — ABNORMAL HIGH (ref 6.5–8.1)

## 2021-09-21 LAB — TROPONIN I (HIGH SENSITIVITY): Troponin I (High Sensitivity): 6 ng/L (ref <18)

## 2021-09-21 MED ORDER — SODIUM CHLORIDE 0.45 % IV SOLN
INTRAVENOUS | Status: DC
  Administered 2021-09-25: 1000 mL via INTRAVENOUS

## 2021-09-21 MED ORDER — ONDANSETRON HCL 4 MG/2ML IJ SOLN
4.0000 mg | INTRAMUSCULAR | Status: DC | PRN
Start: 2021-09-21 — End: 2021-09-22
  Administered 2021-09-21: 4 mg via INTRAVENOUS
  Filled 2021-09-21: qty 2

## 2021-09-21 MED ORDER — HYDROMORPHONE HCL 2 MG/ML IJ SOLN
2.0000 mg | INTRAMUSCULAR | Status: AC
  Filled 2021-09-21: qty 1

## 2021-09-21 MED ORDER — HYDROMORPHONE HCL 2 MG/ML IJ SOLN
2.0000 mg | INTRAMUSCULAR | Status: AC
  Administered 2021-09-21: 2 mg via INTRAVENOUS
  Filled 2021-09-21: qty 1

## 2021-09-21 MED ORDER — HYDROMORPHONE HCL 2 MG/ML IJ SOLN
2.0000 mg | INTRAMUSCULAR | Status: AC
  Administered 2021-09-21: 2 mg via INTRAVENOUS

## 2021-09-21 MED ORDER — HYDROMORPHONE HCL 2 MG/ML IJ SOLN
2.0000 mg | Freq: Once | INTRAMUSCULAR | Status: AC
  Administered 2021-09-21: 2 mg via INTRAVENOUS
  Filled 2021-09-21: qty 1

## 2021-09-21 NOTE — ED Provider Notes (Signed)
[place] DEPT Provider Note   CSN: [id number] Arrival date & time: 09/21/21  1624     History  Chief Complaint  Patient presents with   Sickle Cell Pain Crisis    [name] [name]. is a 41 y.o. male.   Sickle Cell Pain Crisis Patient presents with sickle cell pain shortness of breath and chest pain.  Recently in the hospital.  Had a 2-day admission and was discharged 2 days ago.  States he was feeling okay at discharge but now feeling worse.  States he does feel little short of breath.  No cough.  Some pain in the chest.  He sometimes gets pains like this with his sickle cell crisis.  States he is also been vomiting.  No fevers but states he does feel little cold.  No abdominal pain.  Does ache in his legs which is a typical spot of pain for him.    Past Medical History:  Diagnosis Date   Abscess of right iliac fossa 09/24/2018   required Perc Drain 09/24/2018   Arachnoid Cyst of brain bilaterally    "2 really small ones in the back of my head; inside; saw them w/MRI" (09/25/2012)   Bacterial pneumonia ~ 2012   "caught it here in the hospital" (09/25/2012)   Chronic kidney disease    "from my sickle cell" (09/25/2012)   CKD (chronic kidney disease), stage II    Colitis 04/19/2017   CT scan abd/ pelvis   GERD (gastroesophageal reflux disease)    "after I eat alot of spicey foods" (09/25/2012)   Gynecomastia, male 07/10/2012   History of blood transfusion    "always related to sickle cell crisis" (09/25/2012)   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 Authorizing Provider  amitriptyline (ELAVIL) 25 MG tablet Take 50 mg by mouth at bedtime. 03/08/21  Yes [provider]  calcitRIOL (ROCALTROL) 0.25 MCG capsule Take 0.25 mcg by mouth daily with breakfast. 11/16/20  Yes [provider]  Deferiprone, Twice Daily, (FERRIPROX TWICE-A-DAY) 1000 MG TABS Take 1,000 mg by mouth 2 (two) times daily.   Yes [provider]  folic acid (FOLVITE) 1 MG tablet TAKE 1 TABLET BY MOUTH EVERY DAY Patient taking differently: Take 1 mg by mouth every morning. 03/22/20  Yes [name], FNP  hydroxyurea (HYDREA) 500 MG capsule Take 500-1,000 mg by mouth See admin instructions. Take 1000 mg by mouth on Tuesday and Thursday mornings, take 500 mg on all other mornings 05/07/20  Yes [provider]  oxyCODONE (OXYCONTIN) 15 mg 12 hr tablet Take 1 tablet (15 mg total) by mouth every 12 (twelve) hours. 09/10/21  Yes [name], FNP  Oxycodone HCl 10 MG TABS Take 1 tablet (10 mg total) by mouth every 6 (six) hours as needed (pain). 09/19/21  Yes [name], FNP  patiromer (VELTASSA) 8.4 g packet Take 8.4 g by mouth daily.   Yes [provider]  sodium bicarbonate 650 MG tablet Take 1,950 mg by mouth 3 (three) times daily with meals. 11/07/18  Yes [provider]  voxelotor (OXBRYTA) 500 MG TABS tablet Take 1,500 mg by mouth daily with breakfast.   Yes [provider]      Allergies    Nsaids and Morphine and related    Review of Systems   Review of Systems  Physical Exam Updated Vital Signs BP 114/80   Pulse 89   Temp 98 F (36.7 C) (Oral)   Resp 16   Ht 5\' 3"  (1.6 m)   Wt 54.4 kg   SpO2 100%   BMI 21.26 kg/m  Physical Exam Vitals and nursing note reviewed.  Eyes:     Pupils: Pupils are equal, round, and reactive to light.  Cardiovascular:     Rate and Rhythm: Regular rhythm.   Abdominal:     Tenderness: There is no abdominal tenderness.  Musculoskeletal:        General: No tenderness.     Cervical back: Neck supple.  Skin:    General: Skin is warm.     Capillary Refill: Capillary refill takes less than 2 seconds.  Neurological:     Mental Status: He is alert and oriented to person, place, and time.     ED Results / Procedures / Treatments   Labs (all labs ordered are listed, but only abnormal results are displayed) Labs Reviewed  COMPREHENSIVE METABOLIC PANEL - Abnormal; Notable for the following components:      Result Value   Sodium 132 (*)    Potassium 5.6 (*)    CO2 17 (*)    BUN 56 (*)    Creatinine, Ser 3.82 (*)    Total Protein 8.6 (*)    AST 66 (*)    ALT 50 (*)    Alkaline Phosphatase 236 (*)    Total Bilirubin 1.8 (*)    GFR, Estimated 20 (*)    All other components within normal limits  CBC WITH DIFFERENTIAL/PLATELET - Abnormal; Notable for the following components:   WBC 19.9 (*)    RBC 1.62 (*)    Hemoglobin 6.4 (*)    HCT 17.9 (*)    MCV 110.5 (*)    MCH 39.5 (*)    RDW 24.5 (*)    nRBC 0.5 (*)    Neutro Abs 11.3 (*)    Lymphs Abs 6.8 (*)    Monocytes Absolute 1.5 (*)    Abs Immature Granulocytes 0.13 (*)    All other components within normal limits  RETICULOCYTES - Abnormal; Notable for the following components:   Retic Ct Pct 9.1 (*)    RBC. 1.60 (*)    Immature Retic Fract 30.4 (*)    All other components within normal limits  TROPONIN I (HIGH SENSITIVITY)  TROPONIN I (HIGH SENSITIVITY)    EKG None  Radiology DG Chest 2 View  Result Date: 09/21/2021 CLINICAL DATA:  Recently discharged for sickle cell crisis. Chest pain. Shortness of breath. Nausea and vomiting. EXAM: CHEST - 2 VIEW COMPARISON:  09/17/2021 FINDINGS: Midline trachea. Normal heart size and mediastinal contours. No pleural effusion or pneumothorax. Nonspecific chronic interstitial thickening, likely related to the clinical history of sickle cell. No  lobar consolidation. Cholecystectomy. Osseous findings of sickle cell, including humeral head infarcts. IMPRESSION: Sickle cell, without acute superimposed process. Electronically Signed   By: Abigail Miyamoto M.D.   On: 09/21/2021 17:28    Procedures Procedures    Medications Ordered  in ED Medications  0.45 % sodium chloride infusion ( Intravenous New Bag/Given 09/21/21 2055)  HYDROmorphone (DILAUDID) injection 2 mg (has no administration in time range)  [facility]) injection 4 mg (4 mg Intravenous Given 09/21/21 2052)  HYDROmorphone (DILAUDID) injection 2 mg (2 mg Intravenous Given 09/21/21 2136)  HYDROmorphone (DILAUDID) injection 2 mg (2 mg Intravenous Given 09/21/21 2052)  HYDROmorphone (DILAUDID) injection 2 mg (2 mg Intravenous Given 09/21/21 2235)    ED Course/ Medical Decision Making/ A&P                           Medical Decision Making Risk Prescription drug management.   Patient presents with shortness of breath and chest pain.  Differential diagnosis includes pneumonia, coronary disease, sickle cell pain crisis, acute chest.  Chest x-ray done and reassuring.  Shows no pneumonia or infiltrate.  Will get lab work and give fluids and pain control.  Has been vomiting.  History of sickle cell disease and states he flares up about once a month but usually does not have to flares in a row like this  Hemoglobin is decreased.  However does have elevated reticulocyte count.  Reviewed notes from [place] has been transfused at this level before but also sometimes will be Monitored and not transfused.  Also has worsening kidney function.  Creatinine up to 3.8 which is an increase from his discharge.  Fluids have been given.  As has pain medicines.  Will discuss with hospitalist for admission.        Final Clinical Impression(s) / ED Diagnoses Final diagnoses:  Sickle cell pain crisis ([place])  AKI (acute kidney injury) [facility])    Rx / [place] Orders ED Discharge Orders     None          Davonna Belling, MD 09/21/21 2305

## 2021-09-21 NOTE — ED Notes (Signed)
Pt states ultrasound is always used to start his IV. IV team consult ordered

## 2021-09-21 NOTE — ED Triage Notes (Signed)
Pt states he was discharged from the hospital on the 5th after being treated for a sickle cell crisis. Pt states he is still in a crisis. C/o chest pain, shortness of breath, nausea, and vomiting.

## 2021-09-21 NOTE — ED Provider Triage Note (Signed)
Emergency Medicine Provider Triage Evaluation Note  [name] [name]. , a 41 y.o. male  was evaluated in triage.  Pt complains of chest pain, shortness of breath, nausea, vomiting.  Patient states his chest pain and shortness of breath began the day after his prior discharge.  He notes typical sickle cell pain associated with lower back pain as well as extremity pain.  He states the symptoms are different from his normal sickle cell pain crisis.  He began to have nausea and vomiting a couple times yesterday.  Denies med emesis, fever, chills, night sweats, cough, abdominal pain, urinary symptoms, change in bowel habits..  Review of Systems  Positive: See above Negative:   Physical Exam  BP 113/82 (BP Location: Right Arm)   Pulse (!) 114   Temp 98.2 F (36.8 C) (Oral)   Resp 18   Ht 5\' 3"  (1.6 m)   Wt 54.4 kg   SpO2 95%   BMI 21.26 kg/m  Gen:   Awake, no distress   Resp:  Normal effort  MSK:   Moves extremities without difficulty  Other:  No tenderness to palpation of abdomen.  No tenderness to palpation of anterior chest wall.  Medical Decision Making  Medically screening exam initiated at 5:02 PM.  Appropriate orders placed.  [name] [name]. was informed that the remainder of the evaluation will be completed by another provider, this initial triage assessment does not replace that evaluation, and the importance of remaining in the ED until their evaluation is complete.     Wilnette Kales, [place] 09/21/21 [phone]

## 2021-09-22 ENCOUNTER — Other Ambulatory Visit: Payer: Self-pay

## 2021-09-22 ENCOUNTER — Inpatient Hospital Stay (HOSPITAL_COMMUNITY): Payer: Medicare Other

## 2021-09-22 DIAGNOSIS — N184 Chronic kidney disease, stage 4 (severe): Secondary | ICD-10-CM

## 2021-09-22 DIAGNOSIS — N179 Acute kidney failure, unspecified: Secondary | ICD-10-CM | POA: Diagnosis not present

## 2021-09-22 DIAGNOSIS — D5700 Hb-SS disease with crisis, unspecified: Secondary | ICD-10-CM | POA: Diagnosis not present

## 2021-09-22 DIAGNOSIS — R112 Nausea with vomiting, unspecified: Secondary | ICD-10-CM | POA: Diagnosis not present

## 2021-09-22 DIAGNOSIS — E875 Hyperkalemia: Secondary | ICD-10-CM | POA: Diagnosis not present

## 2021-09-22 DIAGNOSIS — F17210 Nicotine dependence, cigarettes, uncomplicated: Secondary | ICD-10-CM | POA: Diagnosis present

## 2021-09-22 LAB — COMPREHENSIVE METABOLIC PANEL
ALT: 39 U/L (ref 0–44)
AST: 55 U/L — ABNORMAL HIGH (ref 15–41)
Albumin: 3.5 g/dL (ref 3.5–5.0)
Alkaline Phosphatase: 185 U/L — ABNORMAL HIGH (ref 38–126)
Anion gap: 7 (ref 5–15)
BUN: 52 mg/dL — ABNORMAL HIGH (ref 6–20)
CO2: 17 mmol/L — ABNORMAL LOW (ref 22–32)
Calcium: 8.7 mg/dL — ABNORMAL LOW (ref 8.9–10.3)
Chloride: 111 mmol/L (ref 98–111)
Creatinine, Ser: 3.52 mg/dL — ABNORMAL HIGH (ref 0.61–1.24)
GFR, Estimated: 22 mL/min — ABNORMAL LOW (ref >60)
Glucose, Bld: 103 mg/dL — ABNORMAL HIGH (ref 70–99)
Potassium: 4.7 mmol/L (ref 3.5–5.1)
Sodium: 135 mmol/L (ref 135–145)
Total Bilirubin: 1.4 mg/dL — ABNORMAL HIGH (ref 0.3–1.2)
Total Protein: 6.8 g/dL (ref 6.5–8.1)

## 2021-09-22 LAB — URINALYSIS, ROUTINE W REFLEX MICROSCOPIC
Bacteria, UA: NONE SEEN
Bilirubin Urine: NEGATIVE
Glucose, UA: NEGATIVE mg/dL
Ketones, ur: NEGATIVE mg/dL
Leukocytes,Ua: NEGATIVE
Nitrite: NEGATIVE
Protein, ur: 30 mg/dL — AB
Specific Gravity, Urine: 1.004 — ABNORMAL LOW (ref 1.005–1.030)
pH: 6 (ref 5.0–8.0)

## 2021-09-22 LAB — CBC WITH DIFFERENTIAL/PLATELET
Abs Immature Granulocytes: 0.13 10*3/uL — ABNORMAL HIGH (ref 0.00–0.07)
Basophils Absolute: 0 10*3/uL (ref 0.0–0.1)
Basophils Relative: 0 %
Eosinophils Absolute: 0.6 10*3/uL — ABNORMAL HIGH (ref 0.0–0.5)
Eosinophils Relative: 3 %
HCT: 15.7 % — ABNORMAL LOW (ref 39.0–52.0)
Hemoglobin: 5.8 g/dL — CL (ref 13.0–17.0)
Immature Granulocytes: 1 %
Lymphocytes Relative: 47 %
Lymphs Abs: 10.1 10*3/uL — ABNORMAL HIGH (ref 0.7–4.0)
MCH: 40.6 pg — ABNORMAL HIGH (ref 26.0–34.0)
MCHC: 36.9 g/dL — ABNORMAL HIGH (ref 30.0–36.0)
MCV: 109.8 fL — ABNORMAL HIGH (ref 80.0–100.0)
Monocytes Absolute: 1.3 10*3/uL — ABNORMAL HIGH (ref 0.1–1.0)
Monocytes Relative: 6 %
Neutro Abs: 9.2 10*3/uL — ABNORMAL HIGH (ref 1.7–7.7)
Neutrophils Relative %: 43 %
Platelets: 172 10*3/uL (ref 150–400)
RBC: 1.43 MIL/uL — ABNORMAL LOW (ref 4.22–5.81)
RDW: 25.2 % — ABNORMAL HIGH (ref 11.5–15.5)
WBC: 21.4 10*3/uL — ABNORMAL HIGH (ref 4.0–10.5)
nRBC: 0.3 % — ABNORMAL HIGH (ref 0.0–0.2)

## 2021-09-22 LAB — MAGNESIUM: Magnesium: 2.3 mg/dL (ref 1.7–2.4)

## 2021-09-22 LAB — PREPARE RBC (CROSSMATCH)

## 2021-09-22 LAB — HIV ANTIBODY (ROUTINE TESTING W REFLEX): HIV Screen 4th Generation wRfx: NONREACTIVE

## 2021-09-22 LAB — LIPASE, BLOOD: Lipase: 22 U/L (ref 11–51)

## 2021-09-22 LAB — RETICULOCYTES
Immature Retic Fract: 27.1 % — ABNORMAL HIGH (ref 2.3–15.9)
RBC.: 1.42 MIL/uL — ABNORMAL LOW (ref 4.22–5.81)
Retic Count, Absolute: 150.1 10*3/uL (ref 19.0–186.0)
Retic Ct Pct: 10.6 % — ABNORMAL HIGH (ref 0.4–3.1)

## 2021-09-22 LAB — SODIUM, URINE, RANDOM: Sodium, Ur: 56 mmol/L

## 2021-09-22 LAB — CREATININE, URINE, RANDOM: Creatinine, Urine: 32 mg/dL

## 2021-09-22 MED ORDER — FOLIC ACID 1 MG PO TABS
1.0000 mg | ORAL_TABLET | Freq: Every morning | ORAL | Status: DC
  Administered 2021-09-22 – 2021-09-25 (×4): 1 mg via ORAL
  Filled 2021-09-22 (×4): qty 1

## 2021-09-22 MED ORDER — VOXELOTOR 500 MG PO TABS
1500.0000 mg | ORAL_TABLET | Freq: Every day | ORAL | Status: DC
  Administered 2021-09-22 – 2021-09-25 (×4): 1500 mg via ORAL

## 2021-09-22 MED ORDER — DEFERIPRONE (TWICE DAILY) 1000 MG PO TABS: 3000.0000 mg | ORAL_TABLET | ORAL | Status: DC

## 2021-09-22 MED ORDER — OXYCODONE HCL ER 15 MG PO T12A
15.0000 mg | EXTENDED_RELEASE_TABLET | Freq: Once | ORAL | Status: AC
  Administered 2021-09-22: 15 mg via ORAL
  Filled 2021-09-22: qty 1

## 2021-09-22 MED ORDER — DEFERIPRONE 1000 MG PO TABS: 2000.0000 mg | ORAL_TABLET | ORAL | Status: DC

## 2021-09-22 MED ORDER — NON FORMULARY: 1000.0000 mg | Freq: Two times a day (BID) | Status: DC

## 2021-09-22 MED ORDER — OXYCODONE HCL ER 15 MG PO T12A
15.0000 mg | EXTENDED_RELEASE_TABLET | Freq: Two times a day (BID) | ORAL | Status: DC
  Administered 2021-09-22 – 2021-09-25 (×6): 15 mg via ORAL
  Filled 2021-09-22 (×6): qty 1

## 2021-09-22 MED ORDER — ONDANSETRON HCL 4 MG PO TABS: 4.0000 mg | ORAL_TABLET | Freq: Four times a day (QID) | ORAL | Status: DC | PRN

## 2021-09-22 MED ORDER — HYDROMORPHONE HCL 2 MG/ML IJ SOLN
2.0000 mg | Freq: Once | INTRAMUSCULAR | Status: AC
  Administered 2021-09-22: 2 mg via INTRAVENOUS
  Filled 2021-09-22: qty 1

## 2021-09-22 MED ORDER — ACETAMINOPHEN 325 MG PO TABS: 650.0000 mg | ORAL_TABLET | Freq: Four times a day (QID) | ORAL | Status: DC | PRN

## 2021-09-22 MED ORDER — AMITRIPTYLINE HCL 25 MG PO TABS
50.0000 mg | ORAL_TABLET | Freq: Every day | ORAL | Status: DC
Start: 2021-09-22 — End: 2021-09-25
  Administered 2021-09-22 – 2021-09-24 (×3): 50 mg via ORAL
  Filled 2021-09-22 (×3): qty 2

## 2021-09-22 MED ORDER — ONDANSETRON HCL 4 MG/2ML IJ SOLN
4.0000 mg | Freq: Four times a day (QID) | INTRAMUSCULAR | Status: DC | PRN
  Administered 2021-09-23: 4 mg via INTRAVENOUS
  Filled 2021-09-22: qty 2

## 2021-09-22 MED ORDER — ACETAMINOPHEN 650 MG RE SUPP: 650.0000 mg | Freq: Four times a day (QID) | RECTAL | Status: DC | PRN

## 2021-09-22 MED ORDER — DEFERIPRONE 1000 MG PO TABS
2000.0000 mg | ORAL_TABLET | ORAL | Status: DC
  Administered 2021-09-23 – 2021-09-25 (×3): 2000 mg via ORAL

## 2021-09-22 MED ORDER — SODIUM CHLORIDE 0.9% FLUSH: 9.0000 mL | INTRAVENOUS | Status: DC | PRN

## 2021-09-22 MED ORDER — HYDROMORPHONE HCL 2 MG/ML IJ SOLN
2.0000 mg | Freq: Two times a day (BID) | INTRAMUSCULAR | Status: AC | PRN
  Administered 2021-09-22: 2 mg via INTRAVENOUS
  Filled 2021-09-22: qty 1

## 2021-09-22 MED ORDER — DEFERIPRONE 1000 MG PO TABS: 3000.0000 mg | ORAL_TABLET | ORAL | Status: DC

## 2021-09-22 MED ORDER — SODIUM CHLORIDE 0.9% IV SOLUTION: Freq: Once | INTRAVENOUS | Status: AC

## 2021-09-22 MED ORDER — HYDROXYUREA 500 MG PO CAPS: 500.0000 mg | ORAL_CAPSULE | ORAL | Status: DC

## 2021-09-22 MED ORDER — DIPHENHYDRAMINE HCL 25 MG PO CAPS: 25.0000 mg | ORAL_CAPSULE | ORAL | Status: DC | PRN

## 2021-09-22 MED ORDER — SODIUM BICARBONATE 650 MG PO TABS
1950.0000 mg | ORAL_TABLET | Freq: Three times a day (TID) | ORAL | Status: DC
  Administered 2021-09-22 – 2021-09-25 (×11): 1950 mg via ORAL
  Filled 2021-09-22 (×11): qty 3

## 2021-09-22 MED ORDER — ENOXAPARIN SODIUM 30 MG/0.3ML IJ SOSY
30.0000 mg | PREFILLED_SYRINGE | INTRAMUSCULAR | Status: DC
  Filled 2021-09-22 (×3): qty 0.3

## 2021-09-22 MED ORDER — HYDROMORPHONE 1 MG/ML IV SOLN
INTRAVENOUS | Status: DC
  Administered 2021-09-22: 30 mg via INTRAVENOUS
  Administered 2021-09-23: 3.5 mg via INTRAVENOUS
  Administered 2021-09-23: 5 mg via INTRAVENOUS
  Administered 2021-09-23: 8 mg via INTRAVENOUS
  Filled 2021-09-22: qty 30

## 2021-09-22 MED ORDER — NALOXONE HCL 0.4 MG/ML IJ SOLN: 0.4000 mg | INTRAMUSCULAR | Status: DC | PRN

## 2021-09-22 MED ORDER — HYDROXYUREA 500 MG PO CAPS
1000.0000 mg | ORAL_CAPSULE | ORAL | Status: DC
  Administered 2021-09-22: 1000 mg via ORAL
  Filled 2021-09-22: qty 2

## 2021-09-22 MED ORDER — CALCITRIOL 0.25 MCG PO CAPS
0.2500 ug | ORAL_CAPSULE | Freq: Every day | ORAL | Status: DC
  Administered 2021-09-22 – 2021-09-25 (×4): 0.25 ug via ORAL
  Filled 2021-09-22 (×4): qty 1

## 2021-09-22 MED ORDER — POLYETHYLENE GLYCOL 3350 17 G PO PACK: 17.0000 g | PACK | Freq: Every day | ORAL | Status: DC | PRN

## 2021-09-22 MED ORDER — PATIROMER SORBITEX CALCIUM 8.4 G PO PACK
8.4000 g | PACK | Freq: Every day | ORAL | Status: DC
  Administered 2021-09-22 – 2021-09-25 (×4): 8.4 g via ORAL
  Filled 2021-09-22 (×6): qty 1

## 2021-09-22 NOTE — Assessment & Plan Note (Signed)
   Mild hyperkalemia without evidence of ECG changes  Likely secondary to advanced kidney disease as patient is on chronic Veltassa therapy  Resuming home regimen of Veltassa, hydrating patient with intravenous fluids in anticipation that potassium will trend downwards  Monitoring patient on telemetry

## 2021-09-22 NOTE — Assessment & Plan Note (Signed)
   Symptoms of nausea and vomiting seem to have started since the patient's hospitalization and seem to already be improving  Abdominal exam is benign  Hepatic function panel, lipase, urinalysis unremarkable  Possible gastroenteritis that may have set off this episode of acute sickle cell crisis  Supportive care

## 2021-09-22 NOTE — Assessment & Plan Note (Signed)
.   Patient exhibiting evidence of acute kidney injury, possibly secondary to volume depletion due to ongoing nausea and vomiting . Creatinine is currently 3.82 an increase compared to baseline of 2.6-2.8 . Hydrating patient with intravenous  Fluids. . Obtaining urine electrolytes, obtaining urinalysis, obtaining renal ultrasound . Strict input and output monitoring . Monitoring renal function and electrolytes with serial chemistries . Avoiding nephrotoxic agents if at all possible

## 2021-09-22 NOTE — ED Notes (Signed)
Patient requesting more pain meds at this time, provider aware. Patient also stating that he is not hungry at this time and requested his lunch to be removed from his room. Lunch tray removed.

## 2021-09-22 NOTE — ED Notes (Signed)
Blood bank reports that blood will be delayed for this pt d/t pt's specific blood needs.

## 2021-09-22 NOTE — Progress Notes (Signed)
Hydrea Hold Policy - Pharmacy Note  Hydroxyurea (Hydrea) hold criteria: ANC < 2 Pltc < 80K in sickle-cell patients; < 100K in other patients Hgb <= 6 in sickle-cell patients; < 8 in other patients Reticulocytes < 80K when Hgb < 9  Labs: Hgb 5.8  A/P: Hgb currently < 6 therefore meets hold criteria for Hydroxyurea. Will continue to monitor labs while patient in hospital.  Adrian Saran, PharmD, BCPS Secure Chat if ?s 09/22/2021 11:19 AM

## 2021-09-22 NOTE — Assessment & Plan Note (Signed)
.   Patient is being counseled daily on smoking cessation. . Providing patient with nicotine replacement therapy during this hospitalization.   

## 2021-09-22 NOTE — Assessment & Plan Note (Addendum)
   Patient presenting with pain consistent with previous sickle cell pain crises  This episode seem to have started after patient experienced 1 to 2 days of frequent nausea and vomiting, possibly a gastroenteritis that seems to be self-limiting  No clinical evidence of acute chest syndrome  Initiating high-dose intravenous opiate-based analgesics via PCA pump upon arrival to the medical floor  Unable to provide Toradol due to renal disease  Patient will be initiated on intravenous resuscitation with half-normal saline  Obtaining urinalysis, chest x-ray and COVID-19 testing to evaluate for etiology of the onset of this crisis  Monitoring progression of disease with serial CBCs, serial reticulocyte counts, serial bilirubins  Will initiate blood transfusion if hemoglobin continues to downtrend substantially, patient develops worsening chest discomfort or hypoxia.

## 2021-09-22 NOTE — H&P (Addendum)
History and Physical    Patient: [name] [name]. MRN: [id number] DOA: 09/21/2021  Date of Service: the patient was seen and examined on 09/22/2021  Patient coming from: Home  Chief Complaint:  Chief Complaint  Patient presents with   Sickle Cell Pain Crisis    HPI:   41 year old male with past medical history of sickle cell disease, Gilbert's syndrome, chronic pain syndrome, chronic kidney disease stage IV (baseline Cr 2.6-2.8), nicotine dependence, history of polysubstance abuse, migraine headaches presenting to [facility] emergency department with complaints of chest pain, nausea and vomiting.  Of note, patient was recently hospitalized at [facility] long hospital from 8/5 until 8/7 for acute sickle cell crisis.  Hospital course was complicated by acute kidney injury with initial creatinine of 4.14.  Since patient clinically improved with creatinine 2.81 at time of discharge on 8/7.  Patient reports that since his recent hospitalization he has once again surgery for lobe nausea with frequent episodes of vomiting and poor oral intake.  Concurrently, patient does complain of multiple areas of intense pain including his chest and legs.  Pain is aching in quality, severe in intensity, worse with movement and improved with rest.  Patient denies dysuria, cough, fever, sick contacts, recent travel or contact with confirmed COVID-19 infection.  Patient's symptoms continue to worsen until eventually presented to [facility] emergency department for evaluation.  Upon evaluation in the emergency department patient received several doses of intravenous opiate-based analgesics without improvement in pain.  Chest imaging was performed revealing no evidence of infiltrates.  Patient was not found to be hypoxic.  Due to ongoing need for frequent doses of opiate Distalgesic's due to suspected acute sickle cell crisis the hospitalist group was then called to assess the patient for  admission to the hospital.  Review of Systems: Review of Systems  Cardiovascular:  Positive for chest pain.  Musculoskeletal:  Positive for joint pain and myalgias.  All other systems reviewed and are negative.    Past Medical History:  Diagnosis Date   Abscess of right iliac fossa 09/24/2018   required Perc Drain 09/24/2018   Arachnoid Cyst of brain bilaterally    "2 really small ones in the back of my head; inside; saw them w/MRI" (09/25/2012)   Bacterial pneumonia ~ 2012   "caught it here in the hospital" (09/25/2012)   Chronic kidney disease    "from my sickle cell" (09/25/2012)   CKD (chronic kidney disease), stage II    Colitis 04/19/2017   CT scan abd/ pelvis   GERD (gastroesophageal reflux disease)    "after I eat alot of spicey foods" (09/25/2012)   Gynecomastia, male 07/10/2012   History of blood transfusion    "always related to sickle cell crisis" (09/25/2012)   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   Noted in noted from Hematology notes at [facility]    "take RX qd to prevent them" (09/25/2012)   Opioid dependence with withdrawal ([place]) 08/30/2012   Renal insufficiency    Sickle cell anemia (HCC)    Sickle cell crisis ([place]) 09/25/2012   Sickle cell nephropathy ([place]) 07/10/2012   Sinus tachycardia    Tachycardia with heart rate 121-140 beats per minute with ambulation 08/04/2016    Past Surgical History:  Procedure Laterality Date   ABCESS DRAINAGE     CHOLECYSTECTOMY  ~ 2012   COLONOSCOPY N/A 04/23/2017   Procedure: COLONOSCOPY;  Surgeon: Irene Shipper, MD;  Location: [facility] ENDOSCOPY;  Service: Endoscopy;  Laterality: N/A;   IR FLUORO GUIDE CV LINE RIGHT  12/17/2016   IR FLUORO GUIDE CV LINE RIGHT  02/07/2021   IR RADIOLOGIST EVAL & MGMT  10/02/2018   IR RADIOLOGIST EVAL & MGMT  10/15/2018   IR REMOVAL TUN CV CATH W/O FL  12/21/2016   IR REMOVAL TUN CV CATH W/O FL  02/11/2021   IR US GUIDE VASC ACCESS RIGHT  12/17/2016   IR US GUIDE VASC ACCESS  RIGHT  02/07/2021   spleenectomy      Social History:  reports that he has quit smoking. His smoking use included cigarettes. He smoked an average of .1 packs per day. He has never used smokeless tobacco. He reports current alcohol use. He reports current drug use. Drug: Marijuana.  Allergies  Allergen Reactions   Nsaids Other (See Comments)    Kidney disease   Morphine And Related Other (See Comments)    "Real bad headaches"     Family History  Problem Relation Age of Onset   Breast cancer Mother     Prior to Admission medications   Medication Sig Start Date End Date Taking? Authorizing Provider  amitriptyline (ELAVIL) 25 MG tablet Take 50 mg by mouth at bedtime. 03/08/21  Yes [provider]  calcitRIOL (ROCALTROL) 0.25 MCG capsule Take 0.25 mcg by mouth daily with breakfast. 11/16/20  Yes [provider]  Deferiprone, Twice Daily, (FERRIPROX TWICE-A-DAY) 1000 MG TABS Take 1,000 mg by mouth 2 (two) times daily.   Yes [provider]  folic acid (FOLVITE) 1 MG tablet TAKE 1 TABLET BY MOUTH EVERY DAY Patient taking differently: Take 1 mg by mouth every morning. 03/22/20  Yes [name], FNP  hydroxyurea (HYDREA) 500 MG capsule Take 500-1,000 mg by mouth See admin instructions. Take 1000 mg by mouth on Tuesday and Thursday mornings, take 500 mg on all other mornings 05/07/20  Yes [provider]  oxyCODONE (OXYCONTIN) 15 mg 12 hr tablet Take 1 tablet (15 mg total) by mouth every 12 (twelve) hours. 09/10/21  Yes [name], FNP  Oxycodone HCl 10 MG TABS Take 1 tablet (10 mg total) by mouth every 6 (six) hours as needed (pain). 09/19/21  Yes [name], FNP  patiromer (VELTASSA) 8.4 g packet Take 8.4 g by mouth daily.   Yes [provider]  sodium bicarbonate 650 MG tablet Take 1,950 mg by mouth 3 (three) times daily with meals. 11/07/18  Yes [provider]  voxelotor (OXBRYTA) 500 MG TABS tablet Take 1,500 mg by mouth  daily with breakfast.   Yes [provider]    Physical Exam:  Vitals:   09/22/21 0430 09/22/21 0500 09/22/21 0620 09/22/21 0724  BP: 123/83 110/81  103/76  Pulse: 87 92  89  Resp: 11 10  10   Temp:   98 F (36.7 C) 97.8 F (36.6 C)  TempSrc:   Oral Oral  SpO2: 95% 96%  99%  Weight:      Height:        Constitutional: Awake alert and oriented x3, patient is in distress due to pain. Skin: no rashes, no lesions, poor skin turgor noted. Eyes: Pupils are equally reactive to light.  No evidence of scleral icterus or conjunctival pallor.  ENMT: Dry mucous membranes noted.  Posterior pharynx clear of any exudate or lesions.   Neck: normal, supple, no masses, no thyromegaly.  No evidence of jugular venous distension.   Respiratory: clear to auscultation bilaterally, no wheezing, no  crackles. Normal respiratory effort. No accessory muscle use.  Cardiovascular: Regular rate and rhythm, no murmurs / rubs / gallops. No extremity edema. 2+ pedal pulses. No carotid bruits.  Chest:   Nontender without crepitus or deformity.   Back:   Some tenderness of the lower back without crepitus or deformity. Abdomen: Abdomen is soft and nontender.  No evidence of intra-abdominal masses.  Positive bowel sounds noted in all quadrants.   Musculoskeletal: Diffuse tenderness of the bilateral lower extremities without associated deformity.  Good ROM, no contractures. Normal muscle tone.  Neurologic: CN 2-12 grossly intact. Sensation intact.  Patient moving all 4 extremities spontaneously.  Patient is following all commands.  Patient is responsive to verbal stimuli.   Psychiatric: Patient exhibits normal mood with appropriate affect.  Patient seems to possess insight as to their current situation.    Data Reviewed:  I have personally reviewed and interpreted labs, imaging.  Significant findings are:  Chemistry revealing sodium 132, potassium 5.6, chloride 105, bicarbonate 17, BUN 56, creatinine  3.82. CBC revealing white blood cell count of 19.9, hemoglobin of 6.4, hematocrit 17.9, platelet count of 160 Troponin 6 Chest x-ray personally reviewed revealing no evidence of acute cardiopulmonary disease.  EKG: Personally reviewed.  Rhythm is sinus tachycardia with heart rate of 114 bpm.  No dynamic ST segment changes appreciated.   Assessment and Plan: * Acute sickle cell crisis [facility]) Patient presenting with pain consistent with previous sickle cell pain crises This episode seem to have started after patient experienced 1 to 2 days of frequent nausea and vomiting, possibly a gastroenteritis that seems to be self-limiting No clinical evidence of acute chest syndrome Initiating high-dose intravenous opiate-based analgesics via PCA pump upon arrival to the medical floor Unable to provide Toradol due to renal disease Patient will be initiated on intravenous resuscitation with half-normal saline Obtaining urinalysis, chest x-ray and COVID-19 testing to evaluate for etiology of the onset of this crisis Monitoring progression of disease with serial CBCs, serial reticulocyte counts, serial bilirubins Will initiate blood transfusion if hemoglobin continues to downtrend substantially, patient develops worsening chest discomfort or hypoxia.   Nausea and vomiting Symptoms of nausea and vomiting seem to have started since the patient's hospitalization and seem to already be improving Abdominal exam is benign Hepatic function panel, lipase, urinalysis unremarkable Possible gastroenteritis that may have set off this episode of acute sickle cell crisis Supportive care  Acute renal failure superimposed on stage 4 chronic kidney disease ([place]) Patient exhibiting evidence of acute kidney injury, possibly secondary to volume depletion due to ongoing nausea and vomiting Creatinine is currently 3.82 an increase compared to baseline of 2.6-2.8 Hydrating patient with intravenous  Fluids. Obtaining urine  electrolytes, obtaining urinalysis, obtaining renal ultrasound Strict input and output monitoring Monitoring renal function and electrolytes with serial chemistries Avoiding nephrotoxic agents if at all possible   Hyperkalemia Mild hyperkalemia without evidence of ECG changes Likely secondary to advanced kidney disease as patient is on chronic Veltassa therapy Resuming home regimen of Veltassa, hydrating patient with intravenous fluids in anticipation that potassium will trend downwards Monitoring patient on telemetry   Nicotine dependence, cigarettes, uncomplicated Patient is being counseled daily on smoking cessation. Providing patient with nicotine replacement therapy during this hospitalization.         Code Status:  Full code  code status decision has been confirmed with: patient Family Communication: deferred   Consults: NOne  Severity of Illness:  The appropriate patient status for this patient is INPATIENT. Inpatient status  is judged to be reasonable and necessary in order to provide the required intensity of service to ensure the patient's safety. The patient's presenting symptoms, physical exam findings, and initial radiographic and laboratory data in the context of their chronic comorbidities is felt to place them at high risk for further clinical deterioration. Furthermore, it is not anticipated that the patient will be medically stable for discharge from the hospital within 2 midnights of admission.   * I certify that at the point of admission it is my clinical judgment that the patient will require inpatient hospital care spanning beyond 2 midnights from the point of admission due to high intensity of service, high risk for further deterioration and high frequency of surveillance required.*  Author:  Vernelle Emerald MD  09/22/2021 8:35 AM

## 2021-09-22 NOTE — Progress Notes (Signed)
[name] [name] is a 41 year old male with a medical history significant for sickle cell disease type SS, chronic pain syndrome, opiate dependence and tolerance, stage IV chronic kidney disease, history of polysubstance abuse, history of anemia of chronic disease, and chronic marijuana use was admitted overnight for sickle cell pain crisis. Patient states that he was in usual state of health until he developed increased lower extremity pain and shortness of breath with exertion.  Patient also states that nausea and vomiting began 1 day after discharge.  He denies any sick contacts or known exposure to COVID-19. On admission, patient was found to have elevated creatinine and hemoglobin decreased and below his baseline.  While in the emergency department, he was transfused 1 unit PRBCs.  Care plan, Will continue IV Dilaudid PCA Also, gentle hydration will be continued Repeat BMP and CBC in AM.  If hemoglobin is less than 6.0 g/dL, will transfuse an additional unit of PRBCs. Reassess patient in AM.   [name]  APRN, MSN, FNP-C [name] [address] [place], [place] [address] [phone]

## 2021-09-23 DIAGNOSIS — D5700 Hb-SS disease with crisis, unspecified: Secondary | ICD-10-CM | POA: Diagnosis not present

## 2021-09-23 LAB — CBC
HCT: 21.8 % — ABNORMAL LOW (ref 39.0–52.0)
Hemoglobin: 7.8 g/dL — ABNORMAL LOW (ref 13.0–17.0)
MCH: 34.8 pg — ABNORMAL HIGH (ref 26.0–34.0)
MCHC: 35.8 g/dL (ref 30.0–36.0)
MCV: 97.3 fL (ref 80.0–100.0)
Platelets: 188 10*3/uL (ref 150–400)
RBC: 2.24 MIL/uL — ABNORMAL LOW (ref 4.22–5.81)
RDW: 26.5 % — ABNORMAL HIGH (ref 11.5–15.5)
WBC: 16.1 10*3/uL — ABNORMAL HIGH (ref 4.0–10.5)
nRBC: 0.4 % — ABNORMAL HIGH (ref 0.0–0.2)

## 2021-09-23 LAB — BASIC METABOLIC PANEL
Anion gap: 8 (ref 5–15)
BUN: 41 mg/dL — ABNORMAL HIGH (ref 6–20)
CO2: 18 mmol/L — ABNORMAL LOW (ref 22–32)
Calcium: 9 mg/dL (ref 8.9–10.3)
Chloride: 112 mmol/L — ABNORMAL HIGH (ref 98–111)
Creatinine, Ser: 2.98 mg/dL — ABNORMAL HIGH (ref 0.61–1.24)
GFR, Estimated: 27 mL/min — ABNORMAL LOW (ref >60)
Glucose, Bld: 103 mg/dL — ABNORMAL HIGH (ref 70–99)
Potassium: 4.5 mmol/L (ref 3.5–5.1)
Sodium: 138 mmol/L (ref 135–145)

## 2021-09-23 LAB — TYPE AND SCREEN
ABO/RH(D): O POS
Antibody Screen: POSITIVE
DAT, IgG: NEGATIVE
Unit division: 0

## 2021-09-23 LAB — BPAM RBC
Blood Product Expiration Date: 202309162359
ISSUE DATE / TIME: 202308101523
Unit Type and Rh: 5100

## 2021-09-23 MED ORDER — OXYCODONE HCL 5 MG PO TABS
10.0000 mg | ORAL_TABLET | ORAL | Status: DC | PRN
  Administered 2021-09-23 – 2021-09-25 (×2): 10 mg via ORAL
  Filled 2021-09-23 (×2): qty 2

## 2021-09-23 MED ORDER — HYDROMORPHONE 1 MG/ML IV SOLN
INTRAVENOUS | Status: DC
  Administered 2021-09-23: 2 mg via INTRAVENOUS
  Administered 2021-09-23 (×2): 3 mg via INTRAVENOUS
  Administered 2021-09-24: 30 mg via INTRAVENOUS
  Administered 2021-09-24: 1.5 mg via INTRAVENOUS
  Administered 2021-09-24: 4.5 mg via INTRAVENOUS
  Administered 2021-09-25: 1.5 mg via INTRAVENOUS
  Administered 2021-09-25: 2.5 mg via INTRAVENOUS
  Administered 2021-09-25: 5 mg via INTRAVENOUS
  Administered 2021-09-25: 1 mg via INTRAVENOUS
  Administered 2021-09-25: 2.5 mg via INTRAVENOUS
  Filled 2021-09-23: qty 30

## 2021-09-23 NOTE — Progress Notes (Signed)
Subjective: [name] [name] is a 41 year old male with a medical history significant for sickle cell disease type SS, chronic pain syndrome, opiate dependence and tolerance, stage IV chronic kidney disease, history of polysubstance abuse, and history of chronic marijuana use was admitted for symptomatic anemia in the setting of sickle cell pain crisis. On admission, patient's hemoglobin was 5.8.  He is status post 1 unit PRBCs.  Today, patient's hemoglobin has improved.  He states that shortness of breath is resolved.  Patient has tolerated diet well. He also states that low back and lower extremity pain is improving.  He rates his pain as 5/10.  He denies headache, chest pain, urinary symptoms, nausea, vomiting, or diarrhea.  Objective:  Vital signs in last 24 hours:  Vitals:   09/23/21 0000 09/23/21 0013 09/23/21 0400 09/23/21 0406  BP:  113/71  119/80  Pulse:  81  87  Resp: 14 14 16 16   Temp:  98.2 F (36.8 C)  97.8 F (36.6 C)  TempSrc:  Oral  Oral  SpO2: 96% 96% 96% 95%  Weight:      Height:        Intake/Output from previous day:   Intake/Output Summary (Last 24 hours) at 09/23/2021 1023 Last data filed at 09/23/2021 0801 Gross per 24 hour  Intake 3090.07 ml  Output 1850 ml  Net 1240.07 ml    Physical Exam: General: Alert, awake, oriented x3, in no acute distress.  HEENT: [place]/AT PEERL, EOMI Neck: Trachea midline,  no masses, no thyromegal,y no JVD, no carotid bruit OROPHARYNX:  Moist, No exudate/ erythema/lesions.  Heart: Regular rate and rhythm, without murmurs, rubs, gallops, PMI non-displaced, no heaves or thrills on palpation.  Lungs: Clear to auscultation, no wheezing or rhonchi noted. No increased vocal fremitus resonant to percussion  Abdomen: Soft, nontender, nondistended, positive bowel sounds, no masses no hepatosplenomegaly noted..  Neuro: No focal neurological deficits noted cranial nerves II through XII grossly intact. DTRs 2+ bilaterally upper and lower  extremities. Strength 5 out of 5 in bilateral upper and lower extremities. Musculoskeletal: No warm swelling or erythema around joints, no spinal tenderness noted. Psychiatric: Patient alert and oriented x3, good insight and cognition, good recent to remote recall. Lymph node survey: No cervical axillary or inguinal lymphadenopathy noted.  Lab Results:  Basic Metabolic Panel:    Component Value Date/Time   NA 138 09/23/2021 0611   NA 133 (L) 07/26/2021 1047   K 4.5 09/23/2021 0611   CL 112 (H) 09/23/2021 0611   CO2 18 (L) 09/23/2021 0611   BUN 41 (H) 09/23/2021 0611   BUN 57 (H) 07/26/2021 1047   CREATININE 2.98 (H) 09/23/2021 0611   CREATININE 1.45 (H) 05/12/2014 1430   GLUCOSE 103 (H) 09/23/2021 0611   CALCIUM 9.0 09/23/2021 0611   CBC:    Component Value Date/Time   WBC 16.1 (H) 09/23/2021 0611   HGB 7.8 (L) 09/23/2021 0611   HGB 6.6 (LL) 07/26/2021 1047   HCT 21.8 (L) 09/23/2021 0611   HCT 18.0 (LL) 07/26/2021 1047   PLT 188 09/23/2021 0611   PLT 244 07/26/2021 1047   MCV 97.3 09/23/2021 0611   MCV 100 (H) 07/26/2021 1047   NEUTROABS 9.2 (H) 09/22/2021 0613   NEUTROABS 10.1 (H) 07/26/2021 1047   LYMPHSABS 10.1 (H) 09/22/2021 0613   LYMPHSABS 8.9 (H) 07/26/2021 1047   MONOABS 1.3 (H) 09/22/2021 0613   EOSABS 0.6 (H) 09/22/2021 0613   EOSABS 0.5 (H) 07/26/2021 1047   BASOSABS 0.0 09/22/2021 4097  BASOSABS 0.1 07/26/2021 1047    No results found for this or any previous visit (from the past 240 hour(s)).  Studies/Results: US RENAL  Result Date: 09/22/2021 CLINICAL DATA:  41 year old male with history of renal failure. EXAM: RENAL / URINARY TRACT ULTRASOUND COMPLETE COMPARISON:  No priors. FINDINGS: Right Kidney: Renal measurements: 7.8 x 3.8 x 4.7 cm = volume: 73.1 mL. Mild diffuse increased parenchymal echogenicity. No mass or hydronephrosis visualized. Left Kidney: Renal measurements: 8.0 x 4.2 x 3.3 cm = volume: 57.1 mL. Mild diffuse increased parenchymal  echogenicity. Mild hydronephrosis. No mass visualized. Bladder: Appears normal for degree of bladder distention. Other: None. IMPRESSION: 1. Mild left-sided hydronephrosis. 2. Increased renal parenchyma echogenicity bilaterally, suggesting underlying medical renal disease. Electronically Signed   By: Vinnie Langton M.D.   On: 09/22/2021 06:16   DG Chest 2 View  Result Date: 09/21/2021 CLINICAL DATA:  Recently discharged for sickle cell crisis. Chest pain. Shortness of breath. Nausea and vomiting. EXAM: CHEST - 2 VIEW COMPARISON:  09/17/2021 FINDINGS: Midline trachea. Normal heart size and mediastinal contours. No pleural effusion or pneumothorax. Nonspecific chronic interstitial thickening, likely related to the clinical history of sickle cell. No lobar consolidation. Cholecystectomy. Osseous findings of sickle cell, including humeral head infarcts. IMPRESSION: Sickle cell, without acute superimposed process. Electronically Signed   By: Abigail Miyamoto M.D.   On: 09/21/2021 17:28    Medications: Scheduled Meds:  amitriptyline  50 mg Oral QHS   calcitRIOL  0.25 mcg Oral Q breakfast   Deferiprone  2,000 mg Oral Once per day on Sun Mon Wed Fri Sat   [START ON 09/27/2021] Deferiprone  3,000 mg Oral Once per day on Tue Thu   enoxaparin (LOVENOX) injection  30 mg Subcutaneous M35T   folic acid  1 mg Oral q morning   HYDROmorphone   Intravenous Q4H   oxyCODONE  15 mg Oral Q12H   patiromer  8.4 g Oral Daily   sodium bicarbonate  1,950 mg Oral TID WC   voxelotor  1,500 mg Oral Q breakfast   Continuous Infusions:  sodium chloride 100 mL/hr at 09/22/21 1835   PRN Meds:.acetaminophen **OR** acetaminophen, diphenhydrAMINE, naloxone **AND** sodium chloride flush, ondansetron **OR** ondansetron (ZOFRAN) IV, polyethylene glycol  Consultants: None  Procedures: None  Antibiotics: None  Assessment/Plan: Principal Problem:   Acute sickle cell crisis ([place]) Active Problems:   Hyperkalemia   Acute renal  failure superimposed on stage 4 chronic kidney disease (HCC)   Nausea and vomiting   Nicotine dependence, cigarettes, uncomplicated  Sickle cell disease with pain crisis: Weaning IV Dilaudid PCA, settings decreased OxyContin 15 mg every 12 hours Oxycodone 10 mg every 6 hours as needed for severe breakthrough pain Toradol contraindicated due to history of CKD 4 Monitor vital signs very closely, reevaluate pain scale regularly, and supplemental oxygen as needed  Symptomatic anemia: Today, patient's hemoglobin is improved to 7.8 g/dL which is consistent with his baseline.  He is status post 1 unit PRBCs.  Continue to monitor closely.  Labs in AM.  Chronic pain syndrome: Continue patient's home medications  Acute kidney injury superimposed on CKD stage IV: Serum creatinine improved. 2.98<3.52.  Continue gentle hydration.  Avoid all nephrotoxins.  Labs in AM.  Chronic marijuana use: Patient counseled at length on the dangers of smoking, especially in the setting of sickle cell disease.  Code Status: Full Code Family Communication: N/A Disposition Plan: Not yet ready for discharge. Discharge planned for 09/24/2021   [name]  APRN,  MSN, FNP-C Patient [place], [place] [address] [phone]  If 7PM-7AM, please contact night-coverage.  09/23/2021, 10:23 AM  LOS: 2 days

## 2021-09-23 NOTE — TOC Progression Note (Signed)
Transition of Care (TOC) - Progression Note    Patient Details  Name: Markale Birdsell. MRN: [id number] Date of Birth: 01-Sep-1980  Transition of Care [facility]) CM/SW Contact  Servando Snare, [place] Phone Number: 09/23/2021, 10:14 AM  Clinical Narrative:      Transition of Care [facility]) Screening Note   Patient Details  Name: Yaron Grasse. Date of Birth: 01/21/1981   Transition of Care [facility]) CM/SW Contact:    Servando Snare, LCSW Phone Number: 09/23/2021, 10:14 AM    Transition of Care Department [facility]) has reviewed patient and no TOC needs have been identified at this time. We will continue to monitor patient advancement through interdisciplinary progression rounds. If new patient transition needs arise, please place a TOC consult.         Expected Discharge Plan and Services                                                 Social Determinants of Health (SDOH) Interventions    Readmission Risk Interventions    08/01/2021    2:02 PM 07/19/2021    3:53 PM 02/09/2021    1:39 PM  Readmission Risk Prevention Plan  Transportation Screening Complete Complete Complete  Medication Review [employer]) Complete Complete Complete  PCP or Specialist appointment within 3-5 days of discharge Complete Complete Complete  HRI or Home Care Consult Complete Complete Complete  SW Recovery Care/Counseling Consult Complete Complete Complete  Palliative Care Screening Not Applicable Not Applicable Not [place] Not Applicable Not Applicable Not Applicable

## 2021-09-24 DIAGNOSIS — D5700 Hb-SS disease with crisis, unspecified: Secondary | ICD-10-CM | POA: Diagnosis not present

## 2021-09-24 LAB — BASIC METABOLIC PANEL
Anion gap: 8 (ref 5–15)
BUN: 44 mg/dL — ABNORMAL HIGH (ref 6–20)
CO2: 17 mmol/L — ABNORMAL LOW (ref 22–32)
Calcium: 8.8 mg/dL — ABNORMAL LOW (ref 8.9–10.3)
Chloride: 111 mmol/L (ref 98–111)
Creatinine, Ser: 3.02 mg/dL — ABNORMAL HIGH (ref 0.61–1.24)
GFR, Estimated: 26 mL/min — ABNORMAL LOW (ref >60)
Glucose, Bld: 93 mg/dL (ref 70–99)
Potassium: 5.4 mmol/L — ABNORMAL HIGH (ref 3.5–5.1)
Sodium: 136 mmol/L (ref 135–145)

## 2021-09-24 LAB — CBC
HCT: 22.6 % — ABNORMAL LOW (ref 39.0–52.0)
Hemoglobin: 8 g/dL — ABNORMAL LOW (ref 13.0–17.0)
MCH: 34.9 pg — ABNORMAL HIGH (ref 26.0–34.0)
MCHC: 35.4 g/dL (ref 30.0–36.0)
MCV: 98.7 fL (ref 80.0–100.0)
Platelets: 199 10*3/uL (ref 150–400)
RBC: 2.29 MIL/uL — ABNORMAL LOW (ref 4.22–5.81)
RDW: 27.2 % — ABNORMAL HIGH (ref 11.5–15.5)
WBC: 15.8 10*3/uL — ABNORMAL HIGH (ref 4.0–10.5)
nRBC: 0.2 % (ref 0.0–0.2)

## 2021-09-24 NOTE — Progress Notes (Signed)
SICKLE CELL SERVICE PROGRESS NOTE  [name] [name]. JYN:[id number] DOB: 1980/06/19 DOA: 09/21/2021 PCP: [name], FNP  Assessment/Plan: Principal Problem:   Acute sickle cell crisis [facility]) Active Problems:   Acute renal failure superimposed on stage 4 chronic kidney disease (HCC)   Nausea and vomiting   Hyperkalemia   Nicotine dependence, cigarettes, uncomplicated  Sickle cell pain crisis: Patient has pain and hemolytic crisis.  Status posttransfusion of PRBC.  Pain is down to 5 out of 10 in legs and arms. Acute on chronic kidney disease: Patient's creatinine has gone from 2.98-3.02.  Appears to be his new baseline.  I will increase IV fluids to 100 cc an hour of half-normal saline.  Continue to monitor Acute on chronic anemia: Hemoglobin is up to 8.0 today.  Much better.  Continue to monitor Hyperkalemia: Potassium has gone up to 5.4.  Patient may need Kayexalate. Leukocytosis: White count is better down to 15.8.  Code Status: Full code Family Communication: Niece at bedside Disposition Plan: [place]  Pager [phone]. If 7PM-7AM, please contact night-coverage.  09/24/2021, 11:56 AM  LOS: 3 days   Brief narrative: [name] [name] is a 41 year old male with a medical history significant for sickle cell disease type SS, chronic pain syndrome, opiate dependence and tolerance, stage IV chronic kidney disease, history of polysubstance abuse, and history of chronic marijuana use was admitted for symptomatic anemia in the setting of sickle cell pain crisis. On admission, patient's hemoglobin was 5.8.  He is status post 1 unit PRBCs.  Today, patient's hemoglobin has improved.  He states that shortness of breath is resolved.  Patient has tolerated diet well.  Consultants: None  Procedures: None  Antibiotics: None  HPI/Subjective: Patient's pain is down to 6 out of 10.  He however has worsening renal function and potassium.  Objective: Vitals:   09/24/21  0203 09/24/21 0447 09/24/21 0855 09/24/21 1120  BP:  92/70  115/85  Pulse:  92  91  Resp: 16 18 18 15   Temp:  98.7 F (37.1 C)  98.2 F (36.8 C)  TempSrc:  Oral  Oral  SpO2: 99% 96% 98% 97%  Weight:      Height:       Weight change:   Intake/Output Summary (Last 24 hours) at 09/24/2021 1156 Last data filed at 09/24/2021 9528 Gross per 24 hour  Intake 1322.15 ml  Output 2100 ml  Net -777.85 ml    General: Alert, awake, oriented x3, in no acute distress.  HEENT: [place]/AT PEERL, EOMI Neck: Trachea midline,  no masses, no thyromegal,y no JVD, no carotid bruit OROPHARYNX:  Moist, No exudate/ erythema/lesions.  Heart: Regular rate and rhythm, without murmurs, rubs, gallops, PMI non-displaced, no heaves or thrills on palpation.  Lungs: Clear to auscultation, no wheezing or rhonchi noted. No increased vocal fremitus resonant to percussion  Abdomen: Soft, nontender, nondistended, positive bowel sounds, no masses no hepatosplenomegaly noted..  Neuro: No focal neurological deficits noted cranial nerves II through XII grossly intact. DTRs 2+ bilaterally upper and lower extremities. Strength 5 out of 5 in bilateral upper and lower extremities. Musculoskeletal: No warm swelling or erythema around joints, no spinal tenderness noted. Psychiatric: Patient alert and oriented x3, good insight and cognition, good recent to remote recall. Lymph node survey: No cervical axillary or inguinal lymphadenopathy noted.   Data Reviewed: Basic Metabolic Panel: Recent Labs  Lab 09/19/21 0452 09/21/21 2115 09/22/21 0613 09/23/21 0611 09/24/21 0837  NA 137 132* 135 138 136  K 4.8 5.6* 4.7 4.5 5.4*  CL 114* 105 111 112* 111  CO2 16* 17* 17* 18* 17*  GLUCOSE 91 94 103* 103* 93  BUN 46* 56* 52* 41* 44*  CREATININE 2.81* 3.82* 3.52* 2.98* 3.02*  CALCIUM 8.5* 9.0 8.7* 9.0 8.8*  MG  --   --  2.3  --   --    Liver Function Tests: Recent Labs  Lab 09/21/21 2115 09/22/21 0613  AST 66* 55*  ALT 50* 39   ALKPHOS 236* 185*  BILITOT 1.8* 1.4*  PROT 8.6* 6.8  ALBUMIN 4.2 3.5   Recent Labs  Lab 09/22/21 0613  LIPASE 22   No results for input(s): "AMMONIA" in the last 168 hours. CBC: Recent Labs  Lab 09/19/21 0452 09/21/21 2115 09/22/21 0613 09/23/21 0611 09/24/21 0837  WBC 16.9* 19.9* 21.4* 16.1* 15.8*  NEUTROABS 5.7 11.3* 9.2*  --   --   HGB 7.2* 6.4* 5.8* 7.8* 8.0*  HCT 19.3* 17.9* 15.7* 21.8* 22.6*  MCV 108.4* 110.5* 109.8* 97.3 98.7  PLT 135* 160 172 188 199   Cardiac Enzymes: No results for input(s): "CKTOTAL", "CKMB", "CKMBINDEX", "TROPONINI" in the last 168 hours. BNP (last 3 results) No results for input(s): "BNP" in the last 8760 hours.  ProBNP (last 3 results) No results for input(s): "PROBNP" in the last 8760 hours.  CBG: No results for input(s): "GLUCAP" in the last 168 hours.  No results found for this or any previous visit (from the past 240 hour(s)).   Studies: US RENAL  Result Date: 09/22/2021 CLINICAL DATA:  41 year old male with history of renal failure. EXAM: RENAL / URINARY TRACT ULTRASOUND COMPLETE COMPARISON:  No priors. FINDINGS: Right Kidney: Renal measurements: 7.8 x 3.8 x 4.7 cm = volume: 73.1 mL. Mild diffuse increased parenchymal echogenicity. No mass or hydronephrosis visualized. Left Kidney: Renal measurements: 8.0 x 4.2 x 3.3 cm = volume: 57.1 mL. Mild diffuse increased parenchymal echogenicity. Mild hydronephrosis. No mass visualized. Bladder: Appears normal for degree of bladder distention. Other: None. IMPRESSION: 1. Mild left-sided hydronephrosis. 2. Increased renal parenchyma echogenicity bilaterally, suggesting underlying medical renal disease. Electronically Signed   By: Vinnie Langton M.D.   On: 09/22/2021 06:16   DG Chest 2 View  Result Date: 09/21/2021 CLINICAL DATA:  Recently discharged for sickle cell crisis. Chest pain. Shortness of breath. Nausea and vomiting. EXAM: CHEST - 2 VIEW COMPARISON:  09/17/2021 FINDINGS: Midline  trachea. Normal heart size and mediastinal contours. No pleural effusion or pneumothorax. Nonspecific chronic interstitial thickening, likely related to the clinical history of sickle cell. No lobar consolidation. Cholecystectomy. Osseous findings of sickle cell, including humeral head infarcts. IMPRESSION: Sickle cell, without acute superimposed process. Electronically Signed   By: Abigail Miyamoto M.D.   On: 09/21/2021 17:28   DG Lumbar Spine 2-3 Views  Result Date: 09/17/2021 CLINICAL DATA:  Pt endorses back pain and sickle cell crisis since yesterday. Reports nausea and emesis x2 yesterday. Unable to keep anything down for 24 hours. EXAM: LUMBAR SPINE - 2-3 VIEW COMPARISON:  01/21/2018 FINDINGS: No fracture, bone lesion or spondylolisthesis. Disc spaces relatively well maintained. Minor anterior endplate spurring scratch minor anterior endplate spurring. Trabecular prominence diffusely consistent with the patient's sickle cell disease. 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 IMPRESSION: 1. No fracture or acute finding.  No malalignment. Electronically Signed   By: Lajean Manes M.D.   On: 09/17/2021 09:56   DG Chest Port 1 View  Result Date: 09/17/2021 CLINICAL DATA:  Sickle cell crisis.  Back pain. EXAM: PORTABLE CHEST 1 VIEW COMPARISON:  July 30, 2021 FINDINGS: Sickle cell changes to the bones with scattered sclerotic change The heart, hila, and mediastinum are normal. No pneumothorax. No nodules or masses. No focal infiltrates. IMPRESSION: Sickle cell changes of the bones with scattered sclerosis, stable. No acute abnormalities identified within the lungs. Electronically Signed   By: Dorise Bullion III M.D.   On: 09/17/2021 09:54    Scheduled Meds:  amitriptyline  50 mg Oral QHS   calcitRIOL  0.25 mcg Oral Q breakfast   Deferiprone  2,000 mg Oral Once per day on Sun Mon Wed Fri Sat   [START ON 09/27/2021] Deferiprone  3,000 mg Oral Once per day on Tue Thu   enoxaparin (LOVENOX) injection   30 mg Subcutaneous S31R   folic acid  1 mg Oral q morning   HYDROmorphone   Intravenous Q4H   oxyCODONE  15 mg Oral Q12H   patiromer  8.4 g Oral Daily   sodium bicarbonate  1,950 mg Oral TID WC   voxelotor  1,500 mg Oral Q breakfast   Continuous Infusions:  sodium chloride 50 mL/hr at 09/24/21 9458    Principal Problem:   Acute sickle cell crisis ([place]) Active Problems:   Acute renal failure superimposed on stage 4 chronic kidney disease (HCC)   Nausea and vomiting   Hyperkalemia   Nicotine dependence, cigarettes, uncomplicated

## 2021-09-25 DIAGNOSIS — D5700 Hb-SS disease with crisis, unspecified: Secondary | ICD-10-CM | POA: Diagnosis not present

## 2021-09-25 LAB — CBC WITH DIFFERENTIAL/PLATELET
Abs Immature Granulocytes: 0.06 10*3/uL (ref 0.00–0.07)
Basophils Absolute: 0 10*3/uL (ref 0.0–0.1)
Basophils Relative: 0 %
Eosinophils Absolute: 0.4 10*3/uL (ref 0.0–0.5)
Eosinophils Relative: 3 %
HCT: 21.9 % — ABNORMAL LOW (ref 39.0–52.0)
Hemoglobin: 7.8 g/dL — ABNORMAL LOW (ref 13.0–17.0)
Immature Granulocytes: 0 %
Lymphocytes Relative: 43 %
Lymphs Abs: 5.9 10*3/uL — ABNORMAL HIGH (ref 0.7–4.0)
MCH: 35.1 pg — ABNORMAL HIGH (ref 26.0–34.0)
MCHC: 35.6 g/dL (ref 30.0–36.0)
MCV: 98.6 fL (ref 80.0–100.0)
Monocytes Absolute: 1 10*3/uL (ref 0.1–1.0)
Monocytes Relative: 8 %
Neutro Abs: 6.3 10*3/uL (ref 1.7–7.7)
Neutrophils Relative %: 46 %
Platelets: 198 10*3/uL (ref 150–400)
RBC: 2.22 MIL/uL — ABNORMAL LOW (ref 4.22–5.81)
RDW: 26.9 % — ABNORMAL HIGH (ref 11.5–15.5)
WBC: 13.7 10*3/uL — ABNORMAL HIGH (ref 4.0–10.5)
nRBC: 0.2 % (ref 0.0–0.2)

## 2021-09-25 LAB — COMPREHENSIVE METABOLIC PANEL
ALT: 29 U/L (ref 0–44)
AST: 40 U/L (ref 15–41)
Albumin: 3 g/dL — ABNORMAL LOW (ref 3.5–5.0)
Alkaline Phosphatase: 171 U/L — ABNORMAL HIGH (ref 38–126)
Anion gap: 9 (ref 5–15)
BUN: 44 mg/dL — ABNORMAL HIGH (ref 6–20)
CO2: 18 mmol/L — ABNORMAL LOW (ref 22–32)
Calcium: 8.3 mg/dL — ABNORMAL LOW (ref 8.9–10.3)
Chloride: 110 mmol/L (ref 98–111)
Creatinine, Ser: 2.89 mg/dL — ABNORMAL HIGH (ref 0.61–1.24)
GFR, Estimated: 28 mL/min — ABNORMAL LOW (ref >60)
Glucose, Bld: 95 mg/dL (ref 70–99)
Potassium: 5 mmol/L (ref 3.5–5.1)
Sodium: 137 mmol/L (ref 135–145)
Total Bilirubin: 1.5 mg/dL — ABNORMAL HIGH (ref 0.3–1.2)
Total Protein: 6.4 g/dL — ABNORMAL LOW (ref 6.5–8.1)

## 2021-09-25 NOTE — Hospital Course (Signed)
[name] [name] is a 41 year old male with a medical history significant for sickle cell disease type SS, chronic pain syndrome, opiate dependence and tolerance, stage IV chronic kidney disease, history of polysubstance abuse, and history of chronic marijuana use was admitted for symptomatic anemia in the setting of sickle cell pain crisis. On admission, patient's hemoglobin was 5.8.  Patient was transfused 1 unit of packed red blood cells.  His hemoglobin improved to 8.2 and ended up around 7.8.  Renal function also worsened initially with creatinine as high as 3.4 but has improved now with hydration.  He has chronic kidney disease with baseline creatinine in the twos.  Now down to 2.8 at time of discharge.  He was overall feeling better and feeling better.  Patient subsequently being discharged home to follow-up with PCP.  Patient has all prescriptions at home at this point so does not require any prescription prior to discharge.

## 2021-09-25 NOTE — Discharge Summary (Signed)
Physician Discharge Summary   Patient: [name] [name]. MRN: [id number] DOB: [birth year]  Admit date:     09/21/2021  Discharge date: 09/25/21  Discharge Physician: Barbette Merino   PCP: [name], FNP   Recommendations at discharge:   Resume home regimen  Discharge Diagnoses: Principal Problem:   Acute sickle cell crisis [facility]) Active Problems:   Acute renal failure superimposed on stage 4 chronic kidney disease (HCC)   Nausea and vomiting   Hyperkalemia   Nicotine dependence, cigarettes, uncomplicated  Resolved Problems:   * No resolved hospital problems. *  Hospital Course: [name] [name] is a 41 year old male with a medical history significant for sickle cell disease type SS, chronic pain syndrome, opiate dependence and tolerance, stage IV chronic kidney disease, history of polysubstance abuse, and history of chronic marijuana use was admitted for symptomatic anemia in the setting of sickle cell pain crisis. On admission, patient's hemoglobin was 5.8.  Patient was transfused 1 unit of packed red blood cells.  His hemoglobin improved to 8.2 and ended up around 7.8.  Renal function also worsened initially with creatinine as high as 3.4 but has improved now with hydration.  He has chronic kidney disease with baseline creatinine in the twos.  Now down to 2.8 at time of discharge.  He was overall feeling better and feeling better.  Patient subsequently being discharged home to follow-up with PCP.  Patient has all prescriptions at home at this point so does not require any prescription prior to discharge.  Assessment and Plan: * Acute sickle cell crisis [facility]) Patient presenting with pain consistent with previous sickle cell pain crises This episode seem to have started after patient experienced 1 to 2 days of frequent nausea and vomiting, possibly a gastroenteritis that seems to be self-limiting No clinical evidence of acute chest syndrome Initiating high-dose intravenous  opiate-based analgesics via PCA pump upon arrival to the medical floor Unable to provide Toradol due to renal disease Patient will be initiated on intravenous resuscitation with half-normal saline Obtaining urinalysis, chest x-ray and COVID-19 testing to evaluate for etiology of the onset of this crisis Monitoring progression of disease with serial CBCs, serial reticulocyte counts, serial bilirubins Will initiate blood transfusion if hemoglobin continues to downtrend substantially, patient develops worsening chest discomfort or hypoxia.   Nausea and vomiting Symptoms of nausea and vomiting seem to have started since the patient's hospitalization and seem to already be improving Abdominal exam is benign Hepatic function panel, lipase, urinalysis unremarkable Possible gastroenteritis that may have set off this episode of acute sickle cell crisis Supportive care  Acute renal failure superimposed on stage 4 chronic kidney disease ([place]) Patient exhibiting evidence of acute kidney injury, possibly secondary to volume depletion due to ongoing nausea and vomiting Creatinine is currently 3.82 an increase compared to baseline of 2.6-2.8 Hydrating patient with intravenous  Fluids. Obtaining urine electrolytes, obtaining urinalysis, obtaining renal ultrasound Strict input and output monitoring Monitoring renal function and electrolytes with serial chemistries Avoiding nephrotoxic agents if at all possible   Hyperkalemia Mild hyperkalemia without evidence of ECG changes Likely secondary to advanced kidney disease as patient is on chronic Veltassa therapy Resuming home regimen of Veltassa, hydrating patient with intravenous fluids in anticipation that potassium will trend downwards Monitoring patient on telemetry   Nicotine dependence, cigarettes, uncomplicated Patient is being counseled daily on smoking cessation. Providing patient with nicotine replacement therapy during this hospitalization.          Pain control - [facility] Controlled Substance  Reporting System database was reviewed. and patient was instructed, not to drive, operate heavy machinery, perform activities at heights, swimming or participation in water activities or provide baby-sitting services while on Pain, Sleep and Anxiety Medications; until their outpatient Physician has advised to do so again. Also recommended to not to take more than prescribed Pain, Sleep and Anxiety Medications.  Consultants: None Procedures performed: None Blood transfusion Disposition: Home Diet recommendation:  Discharge Diet Orders (From admission, onward)     Start     Ordered   09/25/21 0000  Diet - low sodium heart healthy        09/25/21 1521           Renal diet DISCHARGE MEDICATION: Allergies as of 09/25/2021       Reactions   Nsaids Other (See Comments)   Kidney disease   Morphine And Related Other (See Comments)   "Real bad headaches"         Medication List     TAKE these medications    amitriptyline 25 MG tablet Commonly known as: ELAVIL Take 50 mg by mouth at bedtime.   calcitRIOL 0.25 MCG capsule Commonly known as: ROCALTROL Take 0.25 mcg by mouth daily with breakfast.   Ferriprox Twice-A-Day 1000 MG Tabs Generic drug: Deferiprone (Twice Daily) Take 2,000-3,000 mg by mouth daily. Takes 2 tablets (2000mg ) once daily every day except takes 3 tablets (3000mg ) on Tuesdays and Thursdays   folic acid 1 MG tablet Commonly known as: FOLVITE TAKE 1 TABLET BY MOUTH EVERY DAY What changed: when to take this   hydroxyurea 500 MG capsule Commonly known as: HYDREA Take 500-1,000 mg by mouth See admin instructions. Take 1000 mg by mouth on Tuesday and Thursday mornings, take 500 mg on all other mornings   Oxbryta 500 MG Tabs tablet Generic drug: voxelotor Take 1,500 mg by mouth daily with breakfast.   OxyCONTIN 15 mg 12 hr tablet Generic drug: oxyCODONE Take 1 tablet (15 mg total) by mouth  every 12 (twelve) hours.   Oxycodone HCl 10 MG Tabs Take 1 tablet (10 mg total) by mouth every 6 (six) hours as needed (pain).   sodium bicarbonate 650 MG tablet Take 1,950 mg by mouth 3 (three) times daily with meals.   Veltassa 8.4 g packet Generic drug: patiromer Take 8.4 g by mouth daily.        Discharge Exam: Filed Weights   09/21/21 1645 09/21/21 1839  Weight: 54.4 kg 54.4 kg   Generally: Stable no acute distress HEENT: PERRLA, EOMI no pallor or jaundice Neck: Supple, no JVD no lymphadenopathy Respiratory: Good air entry bilaterally no wheeze no rales or crackles Cardiovascular: regular rate and rhythm  Abdomen: Soft, nontender with positive bowel sounds Extremity: No edema cyanosis or clubbing  Condition at discharge: good  The results of significant diagnostics from this hospitalization (including imaging, microbiology, ancillary and laboratory) are listed below for reference.   Imaging Studies: US RENAL  Result Date: 09/22/2021 CLINICAL DATA:  41 year old male with history of renal failure. EXAM: RENAL / URINARY TRACT ULTRASOUND COMPLETE COMPARISON:  No priors. FINDINGS: Right Kidney: Renal measurements: 7.8 x 3.8 x 4.7 cm = volume: 73.1 mL. Mild diffuse increased parenchymal echogenicity. No mass or hydronephrosis visualized. Left Kidney: Renal measurements: 8.0 x 4.2 x 3.3 cm = volume: 57.1 mL. Mild diffuse increased parenchymal echogenicity. Mild hydronephrosis. No mass visualized. Bladder: Appears normal for degree of bladder distention. Other: None. IMPRESSION: 1. Mild left-sided hydronephrosis. 2. Increased renal parenchyma echogenicity bilaterally,  suggesting underlying medical renal disease. Electronically Signed   By: Vinnie Langton M.D.   On: 09/22/2021 06:16   DG Chest 2 View  Result Date: 09/21/2021 CLINICAL DATA:  Recently discharged for sickle cell crisis. Chest pain. Shortness of breath. Nausea and vomiting. EXAM: CHEST - 2 VIEW COMPARISON:   09/17/2021 FINDINGS: Midline trachea. Normal heart size and mediastinal contours. No pleural effusion or pneumothorax. Nonspecific chronic interstitial thickening, likely related to the clinical history of sickle cell. No lobar consolidation. Cholecystectomy. Osseous findings of sickle cell, including humeral head infarcts. IMPRESSION: Sickle cell, without acute superimposed process. Electronically Signed   By: Abigail Miyamoto M.D.   On: 09/21/2021 17:28   DG Lumbar Spine 2-3 Views  Result Date: 09/17/2021 CLINICAL DATA:  Pt endorses back pain and sickle cell crisis since yesterday. Reports nausea and emesis x2 yesterday. Unable to keep anything down for 24 hours. EXAM: LUMBAR SPINE - 2-3 VIEW COMPARISON:  01/21/2018 FINDINGS: No fracture, bone lesion or spondylolisthesis. Disc spaces relatively well maintained. Minor anterior endplate spurring scratch minor anterior endplate spurring. Trabecular prominence diffusely consistent with the patient's sickle cell disease. 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 IMPRESSION: 1. No fracture or acute finding.  No malalignment. Electronically Signed   By: Lajean Manes M.D.   On: 09/17/2021 09:56   DG Chest Port 1 View  Result Date: 09/17/2021 CLINICAL DATA:  Sickle cell crisis.  Back pain. EXAM: PORTABLE CHEST 1 VIEW COMPARISON:  July 30, 2021 FINDINGS: Sickle cell changes to the bones with scattered sclerotic change The heart, hila, and mediastinum are normal. No pneumothorax. No nodules or masses. No focal infiltrates. IMPRESSION: Sickle cell changes of the bones with scattered sclerosis, stable. No acute abnormalities identified within the lungs. Electronically Signed   By: Dorise Bullion III M.D.   On: 09/17/2021 09:54    Microbiology: Results for orders placed or performed during the hospital encounter of 04/11/21  Resp Panel by RT-PCR (Flu A&B, Covid) Nasopharyngeal Swab     Status: None   Collection Time: 04/11/21  9:36 AM   Specimen:  Nasopharyngeal Swab; Nasopharyngeal(NP) swabs in vial transport medium  Result Value Ref Range Status   SARS Coronavirus 2 by RT PCR NEGATIVE NEGATIVE Final    Comment: (NOTE) SARS-CoV-2 target nucleic acids are NOT DETECTED.  The SARS-CoV-2 RNA is generally detectable in upper respiratory specimens during the acute phase of infection. The lowest concentration of SARS-CoV-2 viral copies this assay can detect is 138 copies/mL. A negative result does not preclude SARS-Cov-2 infection and should not be used as the sole basis for treatment or other patient management decisions. A negative result may occur with  improper specimen collection/handling, submission of specimen other than nasopharyngeal swab, presence of viral mutation(s) within the areas targeted by this assay, and inadequate number of viral copies(<138 copies/mL). A negative result must be combined with clinical observations, patient history, and epidemiological information. The expected result is Negative.  Fact Sheet for Patients:  EntrepreneurPulse.com.au  Fact Sheet for Healthcare Providers:  IncredibleEmployment.be  This test is no t yet approved or cleared by the [place] FDA and  has been authorized for detection and/or diagnosis of SARS-CoV-2 by FDA under an Emergency Use Authorization (EUA). This EUA will remain  in effect (meaning this test can be used) for the duration of the COVID-19 declaration under Section 564(b)(1) of the Act, 21 U.S.C.section 360bbb-3(b)(1), unless the authorization is terminated  or revoked sooner.       Influenza A  by PCR NEGATIVE NEGATIVE Final   Influenza B by PCR NEGATIVE NEGATIVE Final    Comment: (NOTE) The Xpert Xpress SARS-CoV-2/FLU/RSV plus assay is intended as an aid in the diagnosis of influenza from Nasopharyngeal swab specimens and should not be used as a sole basis for treatment. Nasal washings and aspirates are unacceptable for  Xpert Xpress SARS-CoV-2/FLU/RSV testing.  Fact Sheet for Patients: EntrepreneurPulse.com.au  Fact Sheet for Healthcare Providers: IncredibleEmployment.be  This test is not yet approved or cleared by the [place] FDA and has been authorized for detection and/or diagnosis of SARS-CoV-2 by FDA under an Emergency Use Authorization (EUA). This EUA will remain in effect (meaning this test can be used) for the duration of the COVID-19 declaration under Section 564(b)(1) of the Act, 21 U.S.C. section 360bbb-3(b)(1), unless the authorization is terminated or revoked.  Performed at [facility], [address] [place], [place] [address]     Labs: CBC: Recent Labs  Lab 09/19/21 [id number] 09/21/21 2115 09/22/21 [id number] 09/23/21 0611 09/24/21 0837 09/25/21 1141  WBC 16.9* 19.9* 21.4* 16.1* 15.8* 13.7*  NEUTROABS 5.7 11.3* 9.2*  --   --  6.3  HGB 7.2* 6.4* 5.8* 7.8* 8.0* 7.8*  HCT 19.3* 17.9* 15.7* 21.8* 22.6* 21.9*  MCV 108.4* 110.5* 109.8* 97.3 98.7 98.6  PLT 135* 160 172 188 199 062   Basic Metabolic Panel: Recent Labs  Lab 09/21/21 2115 09/22/21 0613 09/23/21 0611 09/24/21 0837 09/25/21 1141  NA 132* 135 138 136 137  K 5.6* 4.7 4.5 5.4* 5.0  CL 105 111 112* 111 110  CO2 17* 17* 18* 17* 18*  GLUCOSE 94 103* 103* 93 95  BUN 56* 52* 41* 44* 44*  CREATININE 3.82* 3.52* 2.98* 3.02* 2.89*  CALCIUM 9.0 8.7* 9.0 8.8* 8.3*  MG  --  2.3  --   --   --    Liver Function Tests: Recent Labs  Lab 09/21/21 2115 09/22/21 0613 09/25/21 1141  AST 66* 55* 40  ALT 50* 39 29  ALKPHOS 236* 185* 171*  BILITOT 1.8* 1.4* 1.5*  PROT 8.6* 6.8 6.4*  ALBUMIN 4.2 3.5 3.0*   CBG: No results for input(s): "GLUCAP" in the last 168 hours.  Discharge time spent: greater than 30 minutes.  SignedBarbette Merino, MD [facility] 09/25/2021

## 2021-09-25 NOTE — Progress Notes (Signed)
Patient refused to go over his discharge instructions. He states he already knows what it says. He also refused to be discharged via wheelchair. Tim ambulated to the front door.

## 2021-09-27 ENCOUNTER — Telehealth: Payer: Self-pay

## 2021-09-27 ENCOUNTER — Other Ambulatory Visit: Payer: Self-pay | Admitting: Family Medicine

## 2021-09-27 ENCOUNTER — Other Ambulatory Visit (HOSPITAL_COMMUNITY): Payer: Self-pay

## 2021-09-27 DIAGNOSIS — D571 Sickle-cell disease without crisis: Secondary | ICD-10-CM

## 2021-09-27 DIAGNOSIS — G894 Chronic pain syndrome: Secondary | ICD-10-CM

## 2021-09-27 MED ORDER — OXYCODONE HCL 10 MG PO TABS
10.0000 mg | ORAL_TABLET | Freq: Four times a day (QID) | ORAL | 0 refills | Status: DC | PRN
  Filled 2021-10-03: qty 60, 15d supply, fill #0

## 2021-09-27 MED ORDER — OXYCODONE HCL ER 15 MG PO T12A: 15.0000 mg | EXTENDED_RELEASE_TABLET | Freq: Two times a day (BID) | ORAL | 0 refills | Status: DC

## 2021-09-27 NOTE — Progress Notes (Signed)
Reviewed PDMP substance reporting system prior to prescribing opiate medications. No inconsistencies noted.  Meds ordered this encounter  Medications   oxyCODONE (OXYCONTIN) 15 mg 12 hr tablet    Sig: Take 1 tablet (15 mg total) by mouth every 12 (twelve) hours.    Dispense:  60 tablet    Refill:  0    Order Specific Question:   Supervising Provider    Answer:   [name] [[id number]]   Oxycodone HCl 10 MG TABS    Sig: Take 1 tablet (10 mg total) by mouth every 6 (six) hours as needed (pain).    Dispense:  60 tablet    Refill:  0    Order Specific Question:   Supervising Provider    Answer:   [name] [[id number]]   [name] Pounds  APRN, MSN, [name] [name] [address] [place], [place] [address] [phone]

## 2021-09-27 NOTE — Telephone Encounter (Signed)
Oxycontin

## 2021-09-30 ENCOUNTER — Other Ambulatory Visit (HOSPITAL_COMMUNITY): Payer: Self-pay

## 2021-10-03 ENCOUNTER — Other Ambulatory Visit (HOSPITAL_COMMUNITY): Payer: Self-pay

## 2021-10-03 ENCOUNTER — Non-Acute Institutional Stay (HOSPITAL_COMMUNITY)
Admission: RE | Admit: 2021-10-03 | Discharge: 2021-10-03 | Disposition: A | Discharge status: Home / Self Care | Payer: Medicare Other | Source: Ambulatory Visit | Attending: Internal Medicine | Admitting: Internal Medicine

## 2021-10-03 DIAGNOSIS — D571 Sickle-cell disease without crisis: Secondary | ICD-10-CM

## 2021-10-03 DIAGNOSIS — D5700 Hb-SS disease with crisis, unspecified: Secondary | ICD-10-CM | POA: Insufficient documentation

## 2021-10-03 LAB — CBC WITH DIFFERENTIAL/PLATELET
Abs Immature Granulocytes: 0.04 10*3/uL (ref 0.00–0.07)
Basophils Absolute: 0 10*3/uL (ref 0.0–0.1)
Basophils Relative: 0 %
Eosinophils Absolute: 0.3 10*3/uL (ref 0.0–0.5)
Eosinophils Relative: 2 %
HCT: 23.7 % — ABNORMAL LOW (ref 39.0–52.0)
Hemoglobin: 8.1 g/dL — ABNORMAL LOW (ref 13.0–17.0)
Immature Granulocytes: 0 %
Lymphocytes Relative: 49 %
Lymphs Abs: 6.7 10*3/uL — ABNORMAL HIGH (ref 0.7–4.0)
MCH: 36.3 pg — ABNORMAL HIGH (ref 26.0–34.0)
MCHC: 34.2 g/dL (ref 30.0–36.0)
MCV: 106.3 fL — ABNORMAL HIGH (ref 80.0–100.0)
Monocytes Absolute: 0.6 10*3/uL (ref 0.1–1.0)
Monocytes Relative: 4 %
Neutro Abs: 6.2 10*3/uL (ref 1.7–7.7)
Neutrophils Relative %: 45 %
Platelets: 306 10*3/uL (ref 150–400)
RBC: 2.23 MIL/uL — ABNORMAL LOW (ref 4.22–5.81)
WBC: 14 10*3/uL — ABNORMAL HIGH (ref 4.0–10.5)
nRBC: 0 % (ref 0.0–0.2)

## 2021-10-03 LAB — COMPREHENSIVE METABOLIC PANEL
ALT: 26 U/L (ref 0–44)
AST: 36 U/L (ref 15–41)
Albumin: 3.2 g/dL — ABNORMAL LOW (ref 3.5–5.0)
Alkaline Phosphatase: 157 U/L — ABNORMAL HIGH (ref 38–126)
Anion gap: 8 (ref 5–15)
BUN: 31 mg/dL — ABNORMAL HIGH (ref 6–20)
CO2: 23 mmol/L (ref 22–32)
Calcium: 8.8 mg/dL — ABNORMAL LOW (ref 8.9–10.3)
Chloride: 107 mmol/L (ref 98–111)
Creatinine, Ser: 3.25 mg/dL — ABNORMAL HIGH (ref 0.61–1.24)
GFR, Estimated: 24 mL/min — ABNORMAL LOW (ref >60)
Glucose, Bld: 90 mg/dL (ref 70–99)
Potassium: 5.3 mmol/L — ABNORMAL HIGH (ref 3.5–5.1)
Sodium: 138 mmol/L (ref 135–145)
Total Bilirubin: 1 mg/dL (ref 0.3–1.2)
Total Protein: 6.8 g/dL (ref 6.5–8.1)

## 2021-10-03 NOTE — Progress Notes (Signed)
PATIENT CARE CENTER NOTE     Diagnosis:  Sickle-cell disease with pain ([place]) [D57.00]      Provider: Ned Clines, MD     Procedure: Lab work     Note:  Patient's labs drawn (CBC w/diff, CMP) peripherally. Patient tolerated well. Alert, oriented and ambulatory at discharge.

## 2021-10-06 LAB — UREA NITROGEN, URINE: Urea Nitrogen, Ur: 12 mg/dL

## 2021-10-11 ENCOUNTER — Emergency Department (HOSPITAL_COMMUNITY): Payer: Medicare Other

## 2021-10-11 ENCOUNTER — Inpatient Hospital Stay (HOSPITAL_COMMUNITY)
Admission: EM | Admit: 2021-10-11 | Discharge: 2021-10-13 | DRG: 812 | Disposition: A | Discharge status: Home / Self Care | Payer: Medicare Other | Attending: Internal Medicine | Admitting: Internal Medicine

## 2021-10-11 ENCOUNTER — Telehealth (HOSPITAL_COMMUNITY): Payer: Self-pay | Admitting: *Deleted

## 2021-10-11 ENCOUNTER — Other Ambulatory Visit: Payer: Self-pay

## 2021-10-11 ENCOUNTER — Encounter (HOSPITAL_COMMUNITY): Payer: Self-pay

## 2021-10-11 DIAGNOSIS — Z803 Family history of malignant neoplasm of breast: Secondary | ICD-10-CM

## 2021-10-11 DIAGNOSIS — D631 Anemia in chronic kidney disease: Secondary | ICD-10-CM | POA: Diagnosis present

## 2021-10-11 DIAGNOSIS — D5700 Hb-SS disease with crisis, unspecified: Secondary | ICD-10-CM | POA: Diagnosis not present

## 2021-10-11 DIAGNOSIS — N184 Chronic kidney disease, stage 4 (severe): Secondary | ICD-10-CM | POA: Diagnosis present

## 2021-10-11 DIAGNOSIS — F1120 Opioid dependence, uncomplicated: Secondary | ICD-10-CM | POA: Diagnosis present

## 2021-10-11 DIAGNOSIS — Z87891 Personal history of nicotine dependence: Secondary | ICD-10-CM | POA: Diagnosis not present

## 2021-10-11 DIAGNOSIS — N289 Disorder of kidney and ureter, unspecified: Secondary | ICD-10-CM

## 2021-10-11 DIAGNOSIS — N179 Acute kidney failure, unspecified: Secondary | ICD-10-CM | POA: Diagnosis present

## 2021-10-11 DIAGNOSIS — Z886 Allergy status to analgesic agent status: Secondary | ICD-10-CM

## 2021-10-11 DIAGNOSIS — Z885 Allergy status to narcotic agent status: Secondary | ICD-10-CM | POA: Diagnosis not present

## 2021-10-11 DIAGNOSIS — E872 Acidosis, unspecified: Secondary | ICD-10-CM | POA: Diagnosis present

## 2021-10-11 DIAGNOSIS — D72829 Elevated white blood cell count, unspecified: Secondary | ICD-10-CM | POA: Diagnosis present

## 2021-10-11 DIAGNOSIS — M879 Osteonecrosis, unspecified: Secondary | ICD-10-CM | POA: Diagnosis present

## 2021-10-11 DIAGNOSIS — Z79899 Other long term (current) drug therapy: Secondary | ICD-10-CM

## 2021-10-11 DIAGNOSIS — G894 Chronic pain syndrome: Secondary | ICD-10-CM | POA: Diagnosis present

## 2021-10-11 LAB — COMPREHENSIVE METABOLIC PANEL
ALT: 31 U/L (ref 0–44)
AST: 39 U/L (ref 15–41)
Albumin: 3.8 g/dL (ref 3.5–5.0)
Alkaline Phosphatase: 166 U/L — ABNORMAL HIGH (ref 38–126)
Anion gap: 8 (ref 5–15)
BUN: 52 mg/dL — ABNORMAL HIGH (ref 6–20)
CO2: 19 mmol/L — ABNORMAL LOW (ref 22–32)
Calcium: 8.8 mg/dL — ABNORMAL LOW (ref 8.9–10.3)
Chloride: 112 mmol/L — ABNORMAL HIGH (ref 98–111)
Creatinine, Ser: 3.72 mg/dL — ABNORMAL HIGH (ref 0.61–1.24)
GFR, Estimated: 20 mL/min — ABNORMAL LOW (ref >60)
Glucose, Bld: 133 mg/dL — ABNORMAL HIGH (ref 70–99)
Potassium: 5 mmol/L (ref 3.5–5.1)
Sodium: 139 mmol/L (ref 135–145)
Total Bilirubin: 1.1 mg/dL (ref 0.3–1.2)
Total Protein: 8 g/dL (ref 6.5–8.1)

## 2021-10-11 LAB — CBC WITH DIFFERENTIAL/PLATELET
Abs Immature Granulocytes: 0.03 10*3/uL (ref 0.00–0.07)
Basophils Absolute: 0.1 10*3/uL (ref 0.0–0.1)
Basophils Relative: 0 %
Eosinophils Absolute: 0.2 10*3/uL (ref 0.0–0.5)
Eosinophils Relative: 2 %
HCT: 27.8 % — ABNORMAL LOW (ref 39.0–52.0)
Hemoglobin: 9.7 g/dL — ABNORMAL LOW (ref 13.0–17.0)
Immature Granulocytes: 0 %
Lymphocytes Relative: 54 %
Lymphs Abs: 8.2 10*3/uL — ABNORMAL HIGH (ref 0.7–4.0)
MCH: 37.5 pg — ABNORMAL HIGH (ref 26.0–34.0)
MCHC: 34.9 g/dL (ref 30.0–36.0)
MCV: 107.3 fL — ABNORMAL HIGH (ref 80.0–100.0)
Monocytes Absolute: 0.8 10*3/uL (ref 0.1–1.0)
Monocytes Relative: 5 %
Neutro Abs: 5.8 10*3/uL (ref 1.7–7.7)
Neutrophils Relative %: 39 %
Platelets: 357 10*3/uL (ref 150–400)
RBC: 2.59 MIL/uL — ABNORMAL LOW (ref 4.22–5.81)
RDW: 24.8 % — ABNORMAL HIGH (ref 11.5–15.5)
WBC: 15 10*3/uL — ABNORMAL HIGH (ref 4.0–10.5)
nRBC: 0 % (ref 0.0–0.2)

## 2021-10-11 LAB — RETICULOCYTES
Immature Retic Fract: 19.8 % — ABNORMAL HIGH (ref 2.3–15.9)
RBC.: 2.58 MIL/uL — ABNORMAL LOW (ref 4.22–5.81)
Retic Count, Absolute: 69.1 10*3/uL (ref 19.0–186.0)
Retic Ct Pct: 2.7 % (ref 0.4–3.1)

## 2021-10-11 MED ORDER — ONDANSETRON HCL 4 MG/2ML IJ SOLN
4.0000 mg | Freq: Once | INTRAMUSCULAR | Status: AC
Start: 2021-10-11 — End: 2021-10-11
  Administered 2021-10-11: 4 mg via INTRAVENOUS
  Filled 2021-10-11: qty 2

## 2021-10-11 MED ORDER — SENNOSIDES-DOCUSATE SODIUM 8.6-50 MG PO TABS
2.0000 | ORAL_TABLET | Freq: Every day | ORAL | Status: DC
Start: 2021-10-11 — End: 2021-10-13
  Filled 2021-10-11 (×2): qty 2

## 2021-10-11 MED ORDER — HYDROMORPHONE HCL 2 MG/ML IJ SOLN
1.0000 mg | Freq: Once | INTRAMUSCULAR | Status: AC
  Administered 2021-10-11: 1 mg via INTRAVENOUS
  Filled 2021-10-11: qty 1

## 2021-10-11 MED ORDER — MELATONIN 3 MG PO TABS
3.0000 mg | ORAL_TABLET | Freq: Every day | ORAL | Status: DC
  Administered 2021-10-11 – 2021-10-12 (×2): 3 mg via ORAL
  Filled 2021-10-11 (×2): qty 1

## 2021-10-11 MED ORDER — SODIUM CHLORIDE 0.9% FLUSH: 9.0000 mL | INTRAVENOUS | Status: DC | PRN

## 2021-10-11 MED ORDER — HYDROMORPHONE HCL 2 MG/ML IJ SOLN: 1.0000 mg | INTRAMUSCULAR | Status: DC | PRN

## 2021-10-11 MED ORDER — NALOXONE HCL 0.4 MG/ML IJ SOLN: 0.4000 mg | INTRAMUSCULAR | Status: DC | PRN

## 2021-10-11 MED ORDER — ENOXAPARIN SODIUM 30 MG/0.3ML IJ SOSY
30.0000 mg | PREFILLED_SYRINGE | INTRAMUSCULAR | Status: DC
  Administered 2021-10-11 – 2021-10-12 (×2): 30 mg via SUBCUTANEOUS
  Filled 2021-10-11 (×2): qty 0.3

## 2021-10-11 MED ORDER — DEFERIPRONE (TWICE DAILY) 1000 MG PO TABS: 2000.0000 mg | ORAL_TABLET | Freq: Every day | ORAL | Status: DC

## 2021-10-11 MED ORDER — HYDROXYUREA 500 MG PO CAPS: 1000.0000 mg | ORAL_CAPSULE | ORAL | Status: DC

## 2021-10-11 MED ORDER — OXYCODONE HCL 5 MG PO TABS: 5.0000 mg | ORAL_TABLET | ORAL | Status: DC | PRN

## 2021-10-11 MED ORDER — HYDROXYUREA 500 MG PO CAPS
500.0000 mg | ORAL_CAPSULE | ORAL | Status: DC
  Filled 2021-10-11: qty 1

## 2021-10-11 MED ORDER — HYDROMORPHONE HCL 2 MG/ML IJ SOLN
1.0000 mg | INTRAMUSCULAR | Status: DC | PRN
  Administered 2021-10-11: 1 mg via INTRAVENOUS
  Filled 2021-10-11: qty 1

## 2021-10-11 MED ORDER — DIPHENHYDRAMINE HCL 25 MG PO CAPS
25.0000 mg | ORAL_CAPSULE | ORAL | Status: DC | PRN
  Administered 2021-10-13: 25 mg via ORAL
  Filled 2021-10-11: qty 1

## 2021-10-11 MED ORDER — HYDROMORPHONE 1 MG/ML IV SOLN
INTRAVENOUS | Status: DC
  Administered 2021-10-12: 30 mg via INTRAVENOUS
  Filled 2021-10-11: qty 30

## 2021-10-11 MED ORDER — DEFERIPRONE 1000 MG PO TABS: 3000.0000 mg | ORAL_TABLET | ORAL | Status: DC

## 2021-10-11 MED ORDER — OXYCODONE HCL ER 15 MG PO T12A
15.0000 mg | EXTENDED_RELEASE_TABLET | Freq: Two times a day (BID) | ORAL | Status: AC
  Administered 2021-10-11 – 2021-10-12 (×2): 15 mg via ORAL
  Filled 2021-10-11 (×2): qty 1

## 2021-10-11 MED ORDER — FOLIC ACID 1 MG PO TABS
1.0000 mg | ORAL_TABLET | Freq: Every morning | ORAL | Status: DC
  Administered 2021-10-12 – 2021-10-13 (×2): 1 mg via ORAL
  Filled 2021-10-11 (×2): qty 1

## 2021-10-11 MED ORDER — ONDANSETRON HCL 4 MG/2ML IJ SOLN
4.0000 mg | Freq: Four times a day (QID) | INTRAMUSCULAR | Status: DC | PRN
  Administered 2021-10-13: 4 mg via INTRAVENOUS
  Filled 2021-10-11: qty 2

## 2021-10-11 MED ORDER — POLYETHYLENE GLYCOL 3350 17 G PO PACK: 17.0000 g | PACK | Freq: Every day | ORAL | Status: DC | PRN

## 2021-10-11 MED ORDER — SODIUM BICARBONATE 650 MG PO TABS
1950.0000 mg | ORAL_TABLET | Freq: Three times a day (TID) | ORAL | Status: DC
  Administered 2021-10-12 – 2021-10-13 (×4): 1950 mg via ORAL
  Filled 2021-10-11 (×4): qty 3

## 2021-10-11 MED ORDER — ACETAMINOPHEN 325 MG PO TABS: 650.0000 mg | ORAL_TABLET | Freq: Four times a day (QID) | ORAL | Status: DC | PRN

## 2021-10-11 MED ORDER — SODIUM CHLORIDE 0.9 % IV SOLN: INTRAVENOUS | Status: DC

## 2021-10-11 MED ORDER — DEFERIPRONE 1000 MG PO TABS
2000.0000 mg | ORAL_TABLET | ORAL | Status: DC
  Administered 2021-10-12: 2000 mg via ORAL

## 2021-10-11 MED ORDER — SODIUM CHLORIDE 0.9 % IV SOLN
INTRAVENOUS | Status: DC
Start: 2021-10-11 — End: 2021-10-12

## 2021-10-11 MED ORDER — PROCHLORPERAZINE EDISYLATE 10 MG/2ML IJ SOLN
10.0000 mg | Freq: Four times a day (QID) | INTRAMUSCULAR | Status: DC | PRN
Start: 2021-10-11 — End: 2021-10-13

## 2021-10-11 MED ORDER — OXYCODONE HCL ER 15 MG PO T12A: 15.0000 mg | EXTENDED_RELEASE_TABLET | Freq: Two times a day (BID) | ORAL | Status: DC

## 2021-10-11 NOTE — ED Provider Triage Note (Signed)
Emergency Medicine Provider Triage Evaluation Note  [name] [name]. , a 41 y.o. male  was evaluated in triage.  Pt complains of sickle cell crisis.  Patient reports that sickle cell crisis began last night, progressively worsened overnight.  The patient states that his pain is in his bilateral legs as well as his hands which are swelling.  The patient reports that he was recently seen at [facility] by the kidney center for his hand swelling.  Patient denies any recent fevers, illnesses, nausea, vomiting, diarrhea, abdominal pain, chest pain.  Patient is endorsing slight shortness of breath.  The patient states his hemoglobin typically runs around 8-9, typically is transfused at 5-6.  The patient is currently seen at the sickle cell clinic, attempted to be seen this morning in the clinic however was redirected to the ED due to swelling.  Review of Systems  Positive:  Negative:   Physical Exam  BP 119/75 (BP Location: Right Arm)   Pulse 92   Temp 98.4 F (36.9 C) (Oral)   Resp 18   SpO2 92%  Gen:   Awake, no distress   Resp:  Normal effort  MSK:   Moves extremities without difficulty  Other:    Medical Decision Making  Medically screening exam initiated at 10:53 AM.  Appropriate orders placed.  [name] [name]. was informed that the remainder of the evaluation will be completed by another provider, this initial triage assessment does not replace that evaluation, and the importance of remaining in the ED until their evaluation is complete.     Azucena Cecil, PA-C 10/11/21 1054

## 2021-10-11 NOTE — ED Notes (Signed)
Pt has ready inpatient bed, waiting on inpatient RN to be assigned to pt so handoff report may be given

## 2021-10-11 NOTE — ED Notes (Signed)
Secure message sent to receiving RN Kathreen Devoid , awaiting response. Pt has an inpatient bed at this time

## 2021-10-11 NOTE — ED Triage Notes (Signed)
Pt reports SCC pain in bilateral legs and arms that began yesterday. Pt also endorses mild SHOB. Denies chest pain, N/V.

## 2021-10-11 NOTE — Telephone Encounter (Signed)
Patient called requesting to come to the day hospital for lab work and fluids. Patient reports swelling in bilateral hands. Patient reports that he thinks his kidney levels are elevated and wants to get them checked. [place], [place] notified and advised patient to go to the ED for evaluation due to swelling and history of kidney disease.  [place] can meet patient in the ED if necessary. Patient advised and expresses an understanding.

## 2021-10-11 NOTE — H&P (Signed)
History and Physical  [name] [name]. [name] [name] DOB: [birth year] DOA: 10/11/2021  Referring physician: Salomon Fick  PCP: [name], FNP  Outpatient Specialists: Sickle cell team. Patient coming from: Home.  Chief Complaint: Legs and hands pain from sickle cell crisis.  HPI: [name] [name]. is a 41 y.o. male with medical history significant for sickle cell disease, CKD 4, anemia of chronic disease, opiate dependence and tolerance, polysubstance abuse including THC, who presented to [facility] ED at the recommendation of his sickle cell clinic due to uncontrolled sickle cell pain.  Endorses legs pain bilaterally and hands pain right greater than left.  Symptoms started yesterday, similar to previous sickle cell pain crisis.  No relief with home opiates analgesics.  He called the sickle cell clinic today and he was advised to come to the ED for further evaluation and management.  No chest pain.  Recently admitted for the same and discharged on 09/25/21, after 3 days of hospitalization.    In the ED, work-up is concerning for sickle cell pain crisis and AKI.  The patient received IV fluid hydration and IV narcotics but with ongoing need for frequent doses of IV analgesics.  TRH, hospitalist service, was asked to admit.  ED Course: Tmax 98.5.  BP 111/78, pulse 74, respiratory 14, O2 saturation 100% on room air.  Lab studies significant for serum bicarb 19, glucose 133, BUN 52, creatinine 3.72 from baseline of 2.9.  GFR 20.  Alkaline phosphatase 166.  WBC 15, hemoglobin 9.7, platelet 357.  Review of Systems: Review of systems as noted in the HPI. All other systems reviewed and are negative.   Past Medical History:  Diagnosis Date   Abscess of right iliac fossa 09/24/2018   required Perc Drain 09/24/2018   Arachnoid Cyst of brain bilaterally    "2 really small ones in the back of my head; inside; saw them w/MRI" (09/25/2012)   Bacterial pneumonia ~ 2012   "caught it here in the  hospital" (09/25/2012)   Chronic kidney disease    "from my sickle cell" (09/25/2012)   CKD (chronic kidney disease), stage II    Colitis 04/19/2017   CT scan abd/ pelvis   GERD (gastroesophageal reflux disease)    "after I eat alot of spicey foods" (09/25/2012)   Gynecomastia, male 07/10/2012   History of blood transfusion    "always related to sickle cell crisis" (09/25/2012)   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   Noted in noted from Hematology notes at [facility]    "take RX qd to prevent them" (09/25/2012)   Opioid dependence with withdrawal ([place]) 08/30/2012   Renal insufficiency    Sickle cell anemia (HCC)    Sickle cell crisis ([place]) 09/25/2012   Sickle cell nephropathy ([place]) 07/10/2012   Sinus tachycardia    Tachycardia with heart rate 121-140 beats per minute with ambulation 08/04/2016   Past Surgical History:  Procedure Laterality Date   ABCESS DRAINAGE     CHOLECYSTECTOMY  ~ 2012   COLONOSCOPY N/A 04/23/2017   Procedure: COLONOSCOPY;  Surgeon: Irene Shipper, MD;  Location: [facility];  Service: Endoscopy;  Laterality: N/A;   IR FLUORO GUIDE CV LINE RIGHT  12/17/2016   IR FLUORO GUIDE CV LINE RIGHT  02/07/2021   IR RADIOLOGIST EVAL & MGMT  10/02/2018   IR RADIOLOGIST EVAL & MGMT  10/15/2018   IR REMOVAL TUN CV CATH W/O FL  12/21/2016   IR REMOVAL TUN CV CATH W/O FL  02/11/2021   IR US GUIDE VASC ACCESS RIGHT  12/17/2016   IR US GUIDE VASC ACCESS RIGHT  02/07/2021   spleenectomy      Social History:  reports that he has quit smoking. His smoking use included cigarettes. He smoked an average of .1 packs per day. He has never used smokeless tobacco. He reports current alcohol use. He reports current drug use. Drug: Marijuana.   Allergies  Allergen Reactions   Nsaids Other (See Comments)    Kidney disease   Morphine And Related Other (See Comments)    "Real bad headaches"     Family History  Problem Relation Age of Onset   Breast cancer Mother        Prior to Admission medications   Medication Sig Start Date End Date Taking? Authorizing Provider  amitriptyline (ELAVIL) 25 MG tablet Take 50 mg by mouth at bedtime. 03/08/21   [provider]  calcitRIOL (ROCALTROL) 0.25 MCG capsule Take 0.25 mcg by mouth daily with breakfast. 11/16/20   [provider]  Deferiprone, Twice Daily, (FERRIPROX TWICE-A-DAY) 1000 MG TABS Take 2,000-3,000 mg by mouth daily. Takes 2 tablets (2000mg ) once daily every day except takes 3 tablets (3000mg ) on Tuesdays and Thursdays    [provider]  folic acid (FOLVITE) 1 MG tablet TAKE 1 TABLET BY MOUTH EVERY DAY Patient taking differently: Take 1 mg by mouth every morning. 03/22/20   [name], FNP  hydroxyurea (HYDREA) 500 MG capsule Take 500-1,000 mg by mouth See admin instructions. Take 1000 mg by mouth on Tuesday and Thursday mornings, take 500 mg on all other mornings 05/07/20   [provider]  oxyCODONE (OXYCONTIN) 15 mg 12 hr tablet Take 1 tablet (15 mg total) by mouth every 12 (twelve) hours. 10/11/21   [name], FNP  Oxycodone HCl 10 MG TABS Take 1 tablet by mouth every 6 hours as needed (pain). 10/03/21   [name], FNP  patiromer (VELTASSA) 8.4 g packet Take 8.4 g by mouth daily.    [provider]  sodium bicarbonate 650 MG tablet Take 1,950 mg by mouth 3 (three) times daily with meals. 11/07/18   [provider]  voxelotor (OXBRYTA) 500 MG TABS tablet Take 1,500 mg by mouth daily with breakfast.    [provider]    Physical Exam: BP 111/78   Pulse 74   Temp 97.9 F (36.6 C) (Oral)   Resp 14   Ht 5\' 3"  (1.6 m)   Wt 54.4 kg   SpO2 100%   BMI 21.26 kg/m   General: 41 y.o. year-old male well developed well nourished in no acute distress.  Alert and oriented x3. Cardiovascular: Regular rate and rhythm with no rubs or gallops.  No thyromegaly or JVD noted.  No lower extremity edema. 2/4 pulses in all 4  extremities. Respiratory: Clear to auscultation with no wheezes or rales. Good inspiratory effort. Abdomen: Soft nontender nondistended with normal bowel sounds x4 quadrants. Muskuloskeletal: No cyanosis, clubbing or edema noted bilaterally Neuro: CN II-XII intact, strength, sensation, reflexes Skin: No ulcerative lesions noted or rashes Psychiatry: Judgement and insight appear normal. Mood is appropriate for condition and setting          Labs on Admission:  Basic Metabolic Panel: Recent Labs  Lab 10/11/21 1701  NA 139  K 5.0  CL 112*  CO2 19*  GLUCOSE 133*  BUN 52*  CREATININE 3.72*  CALCIUM 8.8*   Liver Function Tests: Recent Labs  Lab 10/11/21 1701  AST 39  ALT 31  ALKPHOS 166*  BILITOT 1.1  PROT 8.0  ALBUMIN 3.8   No results for input(s): "LIPASE", "AMYLASE" in the last 168 hours. No results for input(s): "AMMONIA" in the last 168 hours. CBC: Recent Labs  Lab 10/11/21 1701  WBC 15.0*  NEUTROABS 5.8  HGB 9.7*  HCT 27.8*  MCV 107.3*  PLT 357   Cardiac Enzymes: No results for input(s): "CKTOTAL", "CKMB", "CKMBINDEX", "TROPONINI" in the last 168 hours.  BNP (last 3 results) No results for input(s): "BNP" in the last 8760 hours.  ProBNP (last 3 results) No results for input(s): "PROBNP" in the last 8760 hours.  CBG: No results for input(s): "GLUCAP" in the last 168 hours.  Radiological Exams on Admission: DG Hand Complete Right  Result Date: 10/11/2021 CLINICAL DATA:  pain and swelling EXAM: RIGHT HAND - COMPLETE 3+ VIEW COMPARISON:  None Available. FINDINGS: There is subchondral sclerosis and mild collapse of the second metacarpal head at the MCP joint, compatible with osteonecrosis. Similar findings of the fifth meta carpal head to a lesser degree. There is sclerosis and articular surface irregularity of the lunate compatible with osteonecrosis. There is bony erosion and lucency along the proximal aspect of the capitate with articular surface collapse.  Negative ulnar variance. There is no evidence of acute fracture. Mild sclerosis and medullary expansion of the fifth metacarpal and sclerosis of the second metacarpal and 2nd-4th proximal phalanges. Mild sclerosis in the third and fourth middle phalanges. IMPRESSION: Osseous sequela of sickle disease, including osteonecrosis with articular surface collapse of the second metacarpal head at the MCP joint and to a lesser degree of the fifth metacarpal head at the fifth MCP joint. 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 Probable chronic bone infarcts in the 2nd-4th digits. Electronically Signed   By: Maurine Simmering M.D.   On: 10/11/2021 17:17   DG Chest 2 View  Result Date: 10/11/2021 CLINICAL DATA:  Sickle cell disease related pain. Shortness of breath, pain to bilateral legs and arms starting yesterday. EXAM: CHEST - 2 VIEW COMPARISON:  Chest x-rays dated 09/21/2021 and 06/23/2021. FINDINGS: Heart size and mediastinal contours are within normal limits. Chronic coarse interstitial markings are seen throughout both lungs, not significantly changed. No confluent opacity to suggest a superimposed pneumonia or overt alveolar pulmonary edema. No pleural effusion or pneumothorax is seen. Lucency at the RIGHT humeral head, compatible with avascular necrosis. Sclerotic changes of the bilateral ribs, likely related to earlier bone infarcts. Several H-shaped vertebrae within the thoracic spine. These findings are compatible with patient's history of sickle cell disease. No acute-appearing osseous abnormality. IMPRESSION: 1. No acute findings. No evidence of pneumonia or overt alveolar pulmonary edema. 2. Evidence of chronic interstitial lung disease of both lungs, likely related to patient's history of sickle cell disease. 3. Associated chronic osseous changes related to patient's history of sickle cell disease, as detailed above. Electronically Signed   By: Franki Cabot M.D.   On: 10/11/2021  10:18    EKG: I independently viewed the EKG done and my findings are as followed:    Assessment/Plan Present on Admission:  Sickle cell pain crisis ([place])  Active Problems:   Sickle cell pain crisis (HCC) Sickle cell pain crisis, unclear trigger Continue symptomatic management Continue IV fluid hydration NS at 100 cc/h x 2 days. O2 supplementation as needed, maintain O2 saturation above 92% IV Dilaudid as needed for severe pain Home OxyContin BID  As needed  oxycodone Closely monitor on telemetry while on narcotics with superimposed acute on chronic renal impairment. Narcan as needed for overdose.  AKI on CKD 4, suspect multifactorial Baseline creatinine appears to be 2.9 with GFR 27 Presented with creatinine of 3.72 with GFR of 20. Continue IV fluid hydration Monitor urine output with strict I's and O's Avoid nephrotoxic agents, dehydration and hypotension Repeat renal panel in the morning.  Non anion gap metabolic acidosis Serum bicarb 19 anion gap of 8 Gentle IV fluid hydration Resume home oral bicarb supplement Repeat renal panel in the morning.  Leukocytosis, suspect reactive in the setting of sickle cell pain crisis Presented with WBC of greater than 15,000 Afebrile, and not septic appearing. Monitor fever curve and WBC Repeat CBC in the morning  Anemia of chronic disease Hemoglobin 9.7, MCV 107. Resume home folic acid and iron supplement.  Polysubstance abuse including THC UDS positive for THC on 09/17/2021 Polysubstance cessation counseling TOC consulted to provide resources for abstinence.  Elevated alkaline phosphatase Nonspecific AST ALT T. bili normal.  Right hand pain X-ray of right hand revealed sequela of sickle disease, including osteonecrosis with articular surface collapse of the second metacarpal head at the MCP joint and to a lesser degree of the fifth metacarpal head of the fifth MCP joint. Osteonecrosis of the lunate and capitate with  articular surface collapse. Probable chronic bone infarct in the second and fourth digits.    DVT prophylaxis: Subcu Lovenox daily  Code Status: Full code  Family Communication: None at bedside  Disposition Plan: Admitted to telemetry unit  Consults called: None  Admission status: Inpatient status.   Status is: Inpatient The patient requires at least 2 midnights for further evaluation and treatment of present condition.   Kayleen Memos MD [facility] Pager [phone]  If 7PM-7AM, please contact night-coverage [email or web address] Password [facility]  10/11/2021, 9:25 PM

## 2021-10-11 NOTE — ED Provider Notes (Signed)
[facility] DEPT Provider Note   CSN: [id number] Arrival date & time: 10/11/21  0907     History  Chief Complaint  Patient presents with   Sickle Cell Pain Crisis   Shortness of Breath    [name] [name]. is a 41 y.o. male.  41 year old male with past medical history significant for sickle cell disease, chronic kidney disease stage IV, chronic pain syndrome, tobacco abuse, opiate dependence and tolerance, history of polysubstance abuse presents today for evaluation of bilateral lower extremity pain, right hand pain.  He states this is typical for his sickle cell pain crisis.  He also reports some swelling in his right hand.  He states when this happens his kidney function also gets worse.  Denies chest pain.  Endorses mild shortness of breath.  He took 2 of his 10 mg Oxy Contin last night without relief.  Otherwise reports compliance with home medications.  He reached out to his sickle cell clinic who advised him to come into the emergency room.  He denies cough, fever.  The history is provided by the patient. No language interpreter was used.       Home Medications Prior to Admission medications   Medication Sig Start Date End Date Taking? Authorizing Provider  amitriptyline (ELAVIL) 25 MG tablet Take 50 mg by mouth at bedtime. 03/08/21   [provider]  calcitRIOL (ROCALTROL) 0.25 MCG capsule Take 0.25 mcg by mouth daily with breakfast. 11/16/20   [provider]  Deferiprone, Twice Daily, (FERRIPROX TWICE-A-DAY) 1000 MG TABS Take 2,000-3,000 mg by mouth daily. Takes 2 tablets (2000mg ) once daily every day except takes 3 tablets (3000mg ) on Tuesdays and Thursdays    [provider]  folic acid (FOLVITE) 1 MG tablet TAKE 1 TABLET BY MOUTH EVERY DAY Patient taking differently: Take 1 mg by mouth every morning. 03/22/20   [name], FNP  hydroxyurea (HYDREA) 500 MG capsule Take 500-1,000 mg by mouth See admin  instructions. Take 1000 mg by mouth on Tuesday and Thursday mornings, take 500 mg on all other mornings 05/07/20   [provider]  oxyCODONE (OXYCONTIN) 15 mg 12 hr tablet Take 1 tablet (15 mg total) by mouth every 12 (twelve) hours. 10/11/21   [name], FNP  Oxycodone HCl 10 MG TABS Take 1 tablet by mouth every 6 hours as needed (pain). 10/03/21   [name], FNP  patiromer (VELTASSA) 8.4 g packet Take 8.4 g by mouth daily.    [provider]  sodium bicarbonate 650 MG tablet Take 1,950 mg by mouth 3 (three) times daily with meals. 11/07/18   [provider]  voxelotor (OXBRYTA) 500 MG TABS tablet Take 1,500 mg by mouth daily with breakfast.    [provider]      Allergies    Nsaids and Morphine and related    Review of Systems   Review of Systems  Constitutional:  Negative for chills and fever.  Respiratory:  Positive for shortness of breath. Negative for cough.   Cardiovascular:  Negative for chest pain and leg swelling.  Gastrointestinal:  Negative for abdominal pain, nausea and vomiting.  Musculoskeletal:  Positive for arthralgias.  Neurological:  Negative for weakness, light-headedness and headaches.  All other systems reviewed and are negative.   Physical Exam Updated Vital Signs BP 132/81 (BP Location: Right Arm)   Pulse 87   Temp 98.5 F (36.9 C) (Oral)   Resp 16   SpO2 96%  Physical Exam Vitals and nursing note reviewed.  Constitutional:      General: He is not in acute distress.    Appearance: Normal appearance. He is not ill-appearing.  HENT:     Head: Normocephalic and atraumatic.     Nose: Nose normal.  Eyes:     General: No scleral icterus.    Extraocular Movements: Extraocular movements intact.     Conjunctiva/sclera: Conjunctivae normal.  Cardiovascular:     Rate and Rhythm: Normal rate and regular rhythm.     Pulses: Normal pulses.     Heart sounds: Normal heart sounds.  Pulmonary:     Effort:  Pulmonary effort is normal. No respiratory distress.     Breath sounds: Normal breath sounds. No wheezing or rales.  Abdominal:     General: There is no distension.     Tenderness: There is no abdominal tenderness.  Musculoskeletal:        General: No deformity. Normal range of motion.     Cervical back: Normal range of motion.     Comments: Tenderness to palpation present over first MTP joint with minimal swelling.  Full range of motion in all digits of the right hand.  Full range of motion bilateral lower extremities with diffuse tenderness to palpation.  2+ right radial pulse present.  Skin:    General: Skin is warm and dry.     Findings: No rash.  Neurological:     General: No focal deficit present.     Mental Status: He is alert. Mental status is at baseline.     ED Results / Procedures / Treatments   Labs (all labs ordered are listed, but only abnormal results are displayed) Labs Reviewed  COMPREHENSIVE METABOLIC PANEL  CBC WITH DIFFERENTIAL/PLATELET  RETICULOCYTES    EKG None  Radiology DG Chest 2 View  Result Date: 10/11/2021 CLINICAL DATA:  Sickle cell disease related pain. Shortness of breath, pain to bilateral legs and arms starting yesterday. EXAM: CHEST - 2 VIEW COMPARISON:  Chest x-rays dated 09/21/2021 and 06/23/2021. FINDINGS: Heart size and mediastinal contours are within normal limits. Chronic coarse interstitial markings are seen throughout both lungs, not significantly changed. No confluent opacity to suggest a superimposed pneumonia or overt alveolar pulmonary edema. No pleural effusion or pneumothorax is seen. Lucency at the RIGHT humeral head, compatible with avascular necrosis. Sclerotic changes of the bilateral ribs, likely related to earlier bone infarcts. Several H-shaped vertebrae within the thoracic spine. These findings are compatible with patient's history of sickle cell disease. No acute-appearing osseous abnormality. IMPRESSION: 1. No acute findings.  No evidence of pneumonia or overt alveolar pulmonary edema. 2. Evidence of chronic interstitial lung disease of both lungs, likely related to patient's history of sickle cell disease. 3. Associated chronic osseous changes related to patient's history of sickle cell disease, as detailed above. Electronically Signed   By: Franki Cabot M.D.   On: 10/11/2021 10:18    Procedures Procedures    Medications Ordered in ED Medications  0.9 %  sodium chloride infusion (has no administration in time range)  HYDROmorphone (DILAUDID) injection 1 mg (has no administration in time range)  ondansetron (ZOFRAN) injection 4 mg (has no administration in time range)    ED Course/ Medical Decision Making/ A&P Clinical Course as of 10/11/21 1927  Tue Oct 11, 2021  1815 Patient still reports ongoing pain.  Will provide additional dose of Dilaudid.  Work-up reveals leukocytosis of 15,000.  This appears to be his baseline.  Hemoglobin  is improved from his baseline.  Potentially hemoconcentrated.  CMP shows worsening of his creatinine from 3.25 recently to 3.72 today.  Potassium of 5.  He is receiving IV infusion of normal saline.  Elevated reticulocyte count. [AA]    Clinical Course User Index [AA] Evlyn Courier, PA-C                           Medical Decision Making Amount and/or Complexity of Data Reviewed Labs: ordered. Radiology: ordered.  Risk Prescription drug management. Decision regarding hospitalization.   Medical Decision Making / ED Course   This patient presents to the ED for concern of right hand pain, bilateral lower extremity pain, this involves an extensive number of treatment options, and is a complaint that carries with it a high risk of complications and morbidity.  The differential diagnosis includes sickle cell pain crisis, fracture, acute chest syndrome  MDM: 41 year old male with past medical history significant for sickle cell disease, CKD stage IV, chronic pain syndrome presents  today for evaluation of right hand pain, bilateral lower extremity pain since yesterday.  No improvement with home pain medications.  Reports he has taken 2 doses of oxycodone last night as well as Tylenol without relief.  Will evaluate with blood work, right hand x-ray, chest x-ray.  He has also been struggling with renal insufficiency.  Reports adequate hydration. Renal insufficiency compared to recent labs noted.  Patient following 2 doses of pain medication reports no improvement in pain.  Will give 1 additional dose.  If no improvement will discuss with hospitalist for admission. Right hand x-ray does demonstrate osseous sequela of sickle cell disease including osteonecrosis at the second metacarpal head at the MCP joint.  As well as chronic bone infarcts of the second and fourth digits.  These are all chronic findings.  No acute changes noted. Gust with hospitalist will evaluate patient for admission.  Lab Tests: -I ordered, reviewed, and interpreted labs.   The pertinent results include:   Labs Reviewed  COMPREHENSIVE METABOLIC PANEL - Abnormal; Notable for the following components:      Result Value   Chloride 112 (*)    CO2 19 (*)    Glucose, Bld 133 (*)    BUN 52 (*)    Creatinine, Ser 3.72 (*)    Calcium 8.8 (*)    Alkaline Phosphatase 166 (*)    GFR, Estimated 20 (*)    All other components within normal limits  CBC WITH DIFFERENTIAL/PLATELET - Abnormal; Notable for the following components:   WBC 15.0 (*)    RBC 2.59 (*)    Hemoglobin 9.7 (*)    HCT 27.8 (*)    MCV 107.3 (*)    MCH 37.5 (*)    RDW 24.8 (*)    Lymphs Abs 8.2 (*)    All other components within normal limits  RETICULOCYTES - Abnormal; Notable for the following components:   RBC. 2.58 (*)    Immature Retic Fract 19.8 (*)    All other components within normal limits      EKG  EKG Interpretation  Date/Time:    Ventricular Rate:    PR Interval:    QRS Duration:   QT Interval:    QTC Calculation:    R Axis:     Text Interpretation:           Imaging Studies ordered: I ordered imaging studies including right hand x-ray, chest x-ray I independently visualized and  interpreted imaging. I agree with the radiologist interpretation   Medicines ordered and prescription drug management: Meds ordered this encounter  Medications   0.9 %  sodium chloride infusion   HYDROmorphone (DILAUDID) injection 1 mg   ondansetron (ZOFRAN) injection 4 mg   HYDROmorphone (DILAUDID) injection 1 mg    -I have reviewed the patients home medicines and have made adjustments as needed  Reevaluation: After the interventions noted above, I reevaluated the patient and found that they have :stayed the same  Co morbidities that complicate the patient evaluation  Past Medical History:  Diagnosis Date   Abscess of right iliac fossa 09/24/2018   required Perc Drain 09/24/2018   Arachnoid Cyst of brain bilaterally    "2 really small ones in the back of my head; inside; saw them w/MRI" (09/25/2012)   Bacterial pneumonia ~ 2012   "caught it here in the hospital" (09/25/2012)   Chronic kidney disease    "from my sickle cell" (09/25/2012)   CKD (chronic kidney disease), stage II    Colitis 04/19/2017   CT scan abd/ pelvis   GERD (gastroesophageal reflux disease)    "after I eat alot of spicey foods" (09/25/2012)   Gynecomastia, male 07/10/2012   History of blood transfusion    "always related to sickle cell crisis" (09/25/2012)   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   Noted in noted from Hematology notes at [place]    "take RX qd to prevent them" (09/25/2012)   Opioid dependence with withdrawal ([place]) 08/30/2012   Renal insufficiency    Sickle cell anemia (H[facility])    Sickle cell crisis ([place]) 09/25/2012   Sickle cell nephropathy ([place]) 07/10/2012   Sinus tachycardia    Tachycardia with heart rate 121-140 beats per minute with ambulation 08/04/2016      Dispostion: Patient discussed  with hospitalist will evaluate patient for admission.   Final Clinical Impression(s) / ED Diagnoses Final diagnoses:  Sickle cell pain crisis [facility])  Renal insufficiency    Rx / DC Orders ED Discharge Orders     None         Evlyn Courier, PA-C 10/11/21 2234    Carmin Muskrat, MD 10/11/21 2340

## 2021-10-11 NOTE — ED Notes (Signed)
Receiving RN Kathreen Devoid has agreed to accept [name] once pt has arrived to inpatient unit, all questions and concerns address.

## 2021-10-12 ENCOUNTER — Other Ambulatory Visit: Payer: Self-pay | Admitting: Family Medicine

## 2021-10-12 LAB — RENAL FUNCTION PANEL
Albumin: 2.9 g/dL — ABNORMAL LOW (ref 3.5–5.0)
Anion gap: 4 — ABNORMAL LOW (ref 5–15)
BUN: 46 mg/dL — ABNORMAL HIGH (ref 6–20)
CO2: 18 mmol/L — ABNORMAL LOW (ref 22–32)
Calcium: 8.1 mg/dL — ABNORMAL LOW (ref 8.9–10.3)
Chloride: 115 mmol/L — ABNORMAL HIGH (ref 98–111)
Creatinine, Ser: 3.34 mg/dL — ABNORMAL HIGH (ref 0.61–1.24)
GFR, Estimated: 23 mL/min — ABNORMAL LOW (ref >60)
Glucose, Bld: 92 mg/dL (ref 70–99)
Phosphorus: 4.9 mg/dL — ABNORMAL HIGH (ref 2.5–4.6)
Potassium: 5.3 mmol/L — ABNORMAL HIGH (ref 3.5–5.1)
Sodium: 137 mmol/L (ref 135–145)

## 2021-10-12 LAB — CBC WITH DIFFERENTIAL/PLATELET
Abs Immature Granulocytes: 0.07 10*3/uL (ref 0.00–0.07)
Basophils Absolute: 0 10*3/uL (ref 0.0–0.1)
Basophils Relative: 0 %
Eosinophils Absolute: 0.3 10*3/uL (ref 0.0–0.5)
Eosinophils Relative: 2 %
HCT: 23.5 % — ABNORMAL LOW (ref 39.0–52.0)
Hemoglobin: 8.2 g/dL — ABNORMAL LOW (ref 13.0–17.0)
Immature Granulocytes: 1 %
Lymphocytes Relative: 57 %
Lymphs Abs: 8.3 10*3/uL — ABNORMAL HIGH (ref 0.7–4.0)
MCH: 37.3 pg — ABNORMAL HIGH (ref 26.0–34.0)
MCHC: 34.9 g/dL (ref 30.0–36.0)
MCV: 106.8 fL — ABNORMAL HIGH (ref 80.0–100.0)
Monocytes Absolute: 0.7 10*3/uL (ref 0.1–1.0)
Monocytes Relative: 5 %
Neutro Abs: 5 10*3/uL (ref 1.7–7.7)
Neutrophils Relative %: 35 %
Platelets: 269 10*3/uL (ref 150–400)
RBC: 2.2 MIL/uL — ABNORMAL LOW (ref 4.22–5.81)
RDW: 24.7 % — ABNORMAL HIGH (ref 11.5–15.5)
WBC: 14.3 10*3/uL — ABNORMAL HIGH (ref 4.0–10.5)
nRBC: 0 % (ref 0.0–0.2)

## 2021-10-12 LAB — MAGNESIUM: Magnesium: 1.9 mg/dL (ref 1.7–2.4)

## 2021-10-12 MED ORDER — CALCITRIOL 0.25 MCG PO CAPS
0.2500 ug | ORAL_CAPSULE | Freq: Every day | ORAL | Status: DC
  Administered 2021-10-12 – 2021-10-13 (×2): 0.25 ug via ORAL
  Filled 2021-10-12 (×2): qty 1

## 2021-10-12 MED ORDER — OXYCODONE HCL ER 15 MG PO T12A
15.0000 mg | EXTENDED_RELEASE_TABLET | Freq: Two times a day (BID) | ORAL | Status: DC
  Administered 2021-10-12: 15 mg via ORAL
  Filled 2021-10-12: qty 1

## 2021-10-12 MED ORDER — HYDROMORPHONE 1 MG/ML IV SOLN
INTRAVENOUS | Status: DC
  Administered 2021-10-12: 3 mg via INTRAVENOUS
  Administered 2021-10-12: 2.29 mg via INTRAVENOUS
  Administered 2021-10-12: 3.5 mg via INTRAVENOUS
  Administered 2021-10-12: 4 mg via INTRAVENOUS
  Administered 2021-10-12: 5 mg via INTRAVENOUS
  Administered 2021-10-13: 5.5 mg via INTRAVENOUS
  Administered 2021-10-13: 30 mg via INTRAVENOUS
  Administered 2021-10-13: 4 mg via INTRAVENOUS
  Filled 2021-10-12: qty 30

## 2021-10-12 MED ORDER — PATIROMER SORBITEX CALCIUM 8.4 G PO PACK
8.4000 g | PACK | Freq: Every day | ORAL | Status: DC
  Administered 2021-10-12: 8.4 g via ORAL
  Filled 2021-10-12 (×2): qty 1

## 2021-10-12 MED ORDER — SODIUM CHLORIDE 0.45 % IV SOLN: INTRAVENOUS | Status: AC

## 2021-10-12 NOTE — Progress Notes (Signed)
PHARMACY BRIEF NOTE: HYDROXYUREA   By [facility] Health policy, hydroxyurea is automatically held when any of the following laboratory values occur:  ANC < 2 K  Pltc < 80K in sickle-cell patients; < 100K in other patients  Hgb <= 6 in sickle-cell patients; < 8 in other patients  Reticulocytes < 80K when Hgb < 9  8/30: hgb 8.2, last retic 69.1 on 10/11/21   Plan: - Hydroxyurea has been held (discontinued from profile) per policy.   Dia Sitter, PharmD, BCPS 10/12/2021 7:55 AM

## 2021-10-12 NOTE — TOC Transition Note (Signed)
Transition of Care [facility]) - CM/SW Discharge Note   Patient Details  Name: [name] [name]. MRN: [id number] Date of Birth: 1980/08/04  Transition of Care [facility]) CM/SW Contact:  Vassie Moselle, LCSW Phone Number: 10/12/2021, 10:43 AM   Clinical Narrative:    TOC consulted for substance abuse couneling/education for "THC abuse." Met with pt to discuss current use. Pt states he does use THC and does drink alcohol however, denies use causing issues in his daily life and functioning. Pt does not want resources for substance use and does not see use as an issue. CSW encouraged pt to reach out if changes mind. TOC signing off at this time; please consult should further needs arise.    Final next level of care: Home/Self Care Barriers to Discharge: No Barriers Identified   Patient Goals and CMS Choice Patient states their goals for this hospitalization and ongoing recovery are:: "To go home"   Choice offered to / list presented to : Patient  Discharge Placement                       Discharge Plan and Services In-house Referral: NA Discharge Planning Services: CM Consult Post Acute Care Choice: NA          DME Arranged: N/A DME Agency: NA                  Social Determinants of Health (SDOH) Interventions     Readmission Risk Interventions    10/12/2021   10:41 AM 08/01/2021    2:02 PM 07/19/2021    3:53 PM  Readmission Risk Prevention Plan  Transportation Screening Complete Complete Complete  Medication Review Press photographer) Complete Complete Complete  PCP or Specialist appointment within 3-5 days of discharge Complete Complete Complete  HRI or Home Care Consult Complete Complete Complete  SW Recovery Care/Counseling Consult Complete Complete Complete  Palliative Care Screening Not Applicable Not Applicable Not [place] Not Applicable Not Applicable Not Applicable

## 2021-10-12 NOTE — Progress Notes (Signed)
[name] [name] is a 41 year old male with a medical history significant for sickle cell disease, chronic pain syndrome, history of polysubstance abuse, anemia of chronic disease, and chronic kidney disease stage IIIb was admitted overnight for sickle cell pain crisis.  2 days prior, patient noticed that his hands were progressively swelling.  Also, patient had increased pain to upper and lower extremities that was consistent with his typical sickle cell crisis.  Patient's pain intensity is 7/10, he says that pain intensity has improved overnight.  Patient has worsening renal functioning, creatinine is above patient's baseline of two-point 5-3.  He is followed by his nephrology team at [facility] who saw him 1 week ago.  At that time, patient was told his creatinine was increased above baseline and he needed fluids.  He states that he was unable to get to the clinic on last week for fluids.  Care plan: We will start OxyContin 15 mg every 12 hours per patient's home dose Continue IV Dilaudid PCA without any changes Continue to monitor patient's labs closely.  His baseline hemoglobin is 8-9 g/dL.  We will transfuse patient if hemoglobin drops less than 6 g/dL.  Type and screen, CBC, and BMP in a.m.  Sickle cell team will reassess in a.m.  [name]  APRN, MSN, FNP-C [name] [address] [place], [place] [address] [phone]

## 2021-10-13 DIAGNOSIS — N289 Disorder of kidney and ureter, unspecified: Secondary | ICD-10-CM | POA: Diagnosis not present

## 2021-10-13 DIAGNOSIS — D5700 Hb-SS disease with crisis, unspecified: Secondary | ICD-10-CM | POA: Diagnosis not present

## 2021-10-13 LAB — TYPE AND SCREEN
ABO/RH(D): O POS
Antibody Screen: POSITIVE
DAT, IgG: POSITIVE

## 2021-10-13 LAB — RENAL FUNCTION PANEL
Albumin: 3.5 g/dL (ref 3.5–5.0)
Anion gap: 7 (ref 5–15)
BUN: 37 mg/dL — ABNORMAL HIGH (ref 6–20)
CO2: 19 mmol/L — ABNORMAL LOW (ref 22–32)
Calcium: 8.4 mg/dL — ABNORMAL LOW (ref 8.9–10.3)
Chloride: 111 mmol/L (ref 98–111)
Creatinine, Ser: 2.98 mg/dL — ABNORMAL HIGH (ref 0.61–1.24)
GFR, Estimated: 27 mL/min — ABNORMAL LOW (ref >60)
Glucose, Bld: 97 mg/dL (ref 70–99)
Phosphorus: 4.6 mg/dL (ref 2.5–4.6)
Potassium: 4.5 mmol/L (ref 3.5–5.1)
Sodium: 137 mmol/L (ref 135–145)

## 2021-10-13 LAB — CBC
HCT: 24.7 % — ABNORMAL LOW (ref 39.0–52.0)
Hemoglobin: 8.6 g/dL — ABNORMAL LOW (ref 13.0–17.0)
MCH: 37.6 pg — ABNORMAL HIGH (ref 26.0–34.0)
MCHC: 34.8 g/dL (ref 30.0–36.0)
MCV: 107.9 fL — ABNORMAL HIGH (ref 80.0–100.0)
Platelets: 284 10*3/uL (ref 150–400)
RBC: 2.29 MIL/uL — ABNORMAL LOW (ref 4.22–5.81)
RDW: 24.8 % — ABNORMAL HIGH (ref 11.5–15.5)
WBC: 13.6 10*3/uL — ABNORMAL HIGH (ref 4.0–10.5)
nRBC: 0.1 % (ref 0.0–0.2)

## 2021-10-13 MED ORDER — OXYCODONE HCL ER 15 MG PO T12A
15.0000 mg | EXTENDED_RELEASE_TABLET | Freq: Two times a day (BID) | ORAL | Status: DC
  Administered 2021-10-13: 15 mg via ORAL
  Filled 2021-10-13: qty 1

## 2021-10-13 MED ORDER — PROMETHAZINE HCL 25 MG PO TABS
25.0000 mg | ORAL_TABLET | Freq: Once | ORAL | Status: AC
  Administered 2021-10-13: 25 mg via ORAL
  Filled 2021-10-13: qty 1

## 2021-10-13 NOTE — Discharge Summary (Signed)
Physician Discharge Summary  [name] [name]. JQB:[id number] DOB: 05/21/1980 DOA: 10/11/2021  PCP: [name] Dew, FNP  Admit date: 10/11/2021  Discharge date: 10/13/2021  Discharge Diagnoses:  Active Problems:   Sickle cell pain crisis [facility])   Discharge Condition: Stable  Disposition:   Follow-up Information     [name] Dew, FNP Follow up.   Specialty: Family Medicine Why: Labs next Tuesday in day hospital. Will repeat labs every other week. Contact information: [address] [address] [phone]         [name] Dresser, MD .   Specialty: Cardiology Contact information: [address] [place] [address] [phone]                Pt is discharged home in good condition and is to follow up with [name] Dew, FNP this week to have labs evaluated. [name] A Taylor Jr. is instructed to increase activity slowly and balance with rest for the next few days, and use prescribed medication to complete treatment of pain  Diet: Regular Wt Readings from Last 3 Encounters:  10/13/21 55.2 kg  09/21/21 54.4 kg  09/17/21 56.7 kg    History of present illness:  [name] [name] is a 41 year old male with a medical history significant for sickle cell disease, CKD 4, anemia of chronic disease, opiate dependence and tolerance,, polysubstance abuse including THC, who presented to [facility] ED at the recommendation of his sickle cell clinic due to uncontrolled sickle cell pain.  Endorses leg pain bilaterally and hand pain right greater than left.  Symptoms started yesterday, similar to previous sickle cell pain crisis.  No relief with home opiates.  He called the sickle cell clinic today and was advised to come to the ED for further evaluation and management.  No chest pain.  Recently admitted for the same and discharged on 09/26/2018 3:23 days of hospitalization. In the ED, work-up is concerning for sickle cell pain crisis  and AKI.  Patient received IV fluid hydration and IV narcotics, but with ongoing need for frequent doses of IV analgesics [facility] hospitalist were asked to admit.  ED course: Tmax 98.5.  BP 111/78, pulse 74, respirations 14, oxygen saturation 100% on RA.  Lab studies significant for serum bicarb 19, glucose 133, BUN 52, creatinine 3.72 from baseline of 2.9.  GFR 20.  Alkaline phosphatase 166.  WBCs 15, hemoglobin 9.7, and platelets 357.   Hospital Course:  Sickle cell disease with pain crisis, unclear trigger: Patient was admitted for sickle cell pain crisis and pain was managed with IV Dilaudid PCA.  Patient's home medications were resumed during admission and PCA Dilaudid was weaned appropriately.  Patient states that he is not having any pain on today and is requesting discharge.  He continues to have some swelling to his right hand more than likely secondary to chronic kidney disease. Patient will resume home medications and follow up with PCP for medication management.  Acute kidney injury superimposed on CKD stage IV: Patient has had a long history of chronic kidney disease.  His baseline creatinine is around 2.9.  Prior to discharge, creatinine has improved to 3.98.  He is followed by nephrology team at [facility].  Patient will return to [facility] on Tuesday, 10/18/2020 to repeat serum creatinine.  He has expressed understanding.  Also, advised to make first available appointment with nephrologist.  Patient asked to maintain on renal diet and has been given written information in the  past concerning renal diet.  Resume all medications as prescribed by his nephrology team.  Anemia of chronic disease: Patient appears to have a baseline hemoglobin of 9-10 g/dL.  Prior to discharge, hemoglobin is slightly decreased below baseline at 8.6 g/dL.  He did not require any blood transfusions during his admission.  He is followed by [facility] hematology every 3 months.  Patient will follow-up at [facility] on Tuesday to repeat his labs.   We will consult with his hematology team at that time if necessary.  Patient was therefore discharged home today in a hemodynamically stable condition.   [name] [name] will follow-up with PCP within 1 week of this discharge. [name] [name] was counseled extensively about nonpharmacologic means of pain management, patient verbalized understanding and was appreciative of  the care received during this admission.   We discussed the need for good hydration, monitoring of hydration status, avoidance of heat, cold, stress, and infection triggers. We discussed the need to be adherent with taking Hydrea and other home medications. Patient was reminded of the need to seek medical attention immediately if any symptom of bleeding, anemia, or infection occurs.  Discharge Exam: Vitals:   10/13/21 0416 10/13/21 0837  BP: 111/74   Pulse: 97   Resp: 16 (!) 9  Temp: (!) 97.5 F (36.4 C)   SpO2: 98% 98%   Vitals:   10/13/21 0401 10/13/21 0416 10/13/21 0500 10/13/21 0837  BP:  111/74    Pulse:  97    Resp: 14 16  (!) 9  Temp:  (!) 97.5 F (36.4 C)    TempSrc:  Oral    SpO2: 100% 98%  98%  Weight:   55.2 kg   Height:        General appearance : Awake, alert, not in any distress. Speech Clear. Not toxic looking HEENT: Atraumatic and Normocephalic, pupils equally reactive to light and accomodation Neck: Supple, no JVD. No cervical lymphadenopathy.  Chest: Good air entry bilaterally, no added sounds  CVS: S1 S2 regular, murmur present Abdomen: Bowel sounds present, Non tender and not distended with no gaurding, rigidity or rebound. Extremities: B/L Lower Ext shows no edema, both legs are warm to touch Neurology: Awake alert, and oriented X 3, CN II-XII intact, Non focal Skin: No Rash  Discharge Instructions  Discharge Instructions     Discharge patient   Complete by: As directed    Discharge disposition: 01-Home or Self Care   Discharge patient date: 10/13/2021      Allergies as of 10/13/2021        Reactions   Nsaids Other (See Comments)   Kidney disease   Morphine And Related Other (See Comments)   "Real bad headaches"         Medication List     TAKE these medications    amitriptyline 25 MG tablet Commonly known as: ELAVIL Take 50 mg by mouth at bedtime.   calcitRIOL 0.25 MCG capsule Commonly known as: ROCALTROL Take 0.25 mcg by mouth daily with breakfast.   Ferriprox Twice-A-Day 1000 MG Tabs Generic drug: Deferiprone (Twice Daily) Take 2,000-3,000 mg by mouth daily. Takes 2 tablets (2000mg ) once daily every day except takes 3 tablets (3000mg ) on Tuesdays and Thursdays   folic acid 1 MG tablet Commonly known as: FOLVITE TAKE 1 TABLET BY MOUTH EVERY DAY What changed: when to take this   hydroxyurea 500 MG capsule Commonly known as: HYDREA Take 500-1,000 mg by mouth See admin instructions. Take 1000 mg by mouth on Tuesday and  Thursday mornings, take 500 mg on all other mornings   Oxbryta 500 MG Tabs tablet Generic drug: voxelotor Take 1,500 mg by mouth daily with breakfast.   Oxycodone HCl 10 MG Tabs Take 1 tablet by mouth every 6 hours as needed (pain).   oxyCODONE 15 mg 12 hr tablet Commonly known as: OXYCONTIN Take 1 tablet (15 mg total) by mouth every 12 (twelve) hours.   sodium bicarbonate 650 MG tablet Take 1,950 mg by mouth 3 (three) times daily with meals.   Veltassa 8.4 g packet Generic drug: patiromer Take 8.4 g by mouth daily.        The results of significant diagnostics from this hospitalization (including imaging, microbiology, ancillary and laboratory) are listed below for reference.    Significant Diagnostic Studies: DG Hand Complete Right  Result Date: 10/11/2021 CLINICAL DATA:  pain and swelling EXAM: RIGHT HAND - COMPLETE 3+ VIEW COMPARISON:  None Available. FINDINGS: There is subchondral sclerosis and mild collapse of the second metacarpal head at the MCP joint, compatible with osteonecrosis. Similar findings of the fifth meta  carpal head to a lesser degree. There is sclerosis and articular surface irregularity of the lunate compatible with osteonecrosis. There is bony erosion and lucency along the proximal aspect of the capitate with articular surface collapse. Negative ulnar variance. There is no evidence of acute fracture. Mild sclerosis and medullary expansion of the fifth metacarpal and sclerosis of the second metacarpal and 2nd-4th proximal phalanges. Mild sclerosis in the third and fourth middle phalanges. IMPRESSION: Osseous sequela of sickle disease, including osteonecrosis with articular surface collapse of the second metacarpal head at the MCP joint and to a lesser degree of the fifth metacarpal head at the fifth MCP joint. 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 Probable chronic bone infarcts in the 2nd-4th digits. Electronically Signed   By: Maurine Simmering M.D.   On: 10/11/2021 17:17   DG Chest 2 View  Result Date: 10/11/2021 CLINICAL DATA:  Sickle cell disease related pain. Shortness of breath, pain to bilateral legs and arms starting yesterday. EXAM: CHEST - 2 VIEW COMPARISON:  Chest x-rays dated 09/21/2021 and 06/23/2021. FINDINGS: Heart size and mediastinal contours are within normal limits. Chronic coarse interstitial markings are seen throughout both lungs, not significantly changed. No confluent opacity to suggest a superimposed pneumonia or overt alveolar pulmonary edema. No pleural effusion or pneumothorax is seen. Lucency at the RIGHT humeral head, compatible with avascular necrosis. Sclerotic changes of the bilateral ribs, likely related to earlier bone infarcts. Several H-shaped vertebrae within the thoracic spine. These findings are compatible with patient's history of sickle cell disease. No acute-appearing osseous abnormality. IMPRESSION: 1. No acute findings. No evidence of pneumonia or overt alveolar pulmonary edema. 2. Evidence of chronic interstitial lung disease of both  lungs, likely related to patient's history of sickle cell disease. 3. Associated chronic osseous changes related to patient's history of sickle cell disease, as detailed above. Electronically Signed   By: Franki Cabot M.D.   On: 10/11/2021 10:18   US RENAL  Result Date: 09/22/2021 CLINICAL DATA:  41 year old male with history of renal failure. EXAM: RENAL / URINARY TRACT ULTRASOUND COMPLETE COMPARISON:  No priors. FINDINGS: Right Kidney: Renal measurements: 7.8 x 3.8 x 4.7 cm = volume: 73.1 mL. Mild diffuse increased parenchymal echogenicity. No mass or hydronephrosis visualized. Left Kidney: Renal measurements: 8.0 x 4.2 x 3.3 cm = volume: 57.1 mL. Mild diffuse increased parenchymal echogenicity. Mild hydronephrosis. No mass visualized. Bladder: Appears  normal for degree of bladder distention. Other: None. IMPRESSION: 1. Mild left-sided hydronephrosis. 2. Increased renal parenchyma echogenicity bilaterally, suggesting underlying medical renal disease. Electronically Signed   By: Vinnie Langton M.D.   On: 09/22/2021 06:16   DG Chest 2 View  Result Date: 09/21/2021 CLINICAL DATA:  Recently discharged for sickle cell crisis. Chest pain. Shortness of breath. Nausea and vomiting. EXAM: CHEST - 2 VIEW COMPARISON:  09/17/2021 FINDINGS: Midline trachea. Normal heart size and mediastinal contours. No pleural effusion or pneumothorax. Nonspecific chronic interstitial thickening, likely related to the clinical history of sickle cell. No lobar consolidation. Cholecystectomy. Osseous findings of sickle cell, including humeral head infarcts. IMPRESSION: Sickle cell, without acute superimposed process. Electronically Signed   By: Abigail Miyamoto M.D.   On: 09/21/2021 17:28   DG Lumbar Spine 2-3 Views  Result Date: 09/17/2021 CLINICAL DATA:  Pt endorses back pain and sickle cell crisis since yesterday. Reports nausea and emesis x2 yesterday. Unable to keep anything down for 24 hours. EXAM: LUMBAR SPINE - 2-3 VIEW  COMPARISON:  01/21/2018 FINDINGS: No fracture, bone lesion or spondylolisthesis. Disc spaces relatively well maintained. Minor anterior endplate spurring scratch minor anterior endplate spurring. Trabecular prominence diffusely consistent with the patient's sickle cell disease. 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 IMPRESSION: 1. No fracture or acute finding.  No malalignment. Electronically Signed   By: Lajean Manes M.D.   On: 09/17/2021 09:56   DG Chest Port 1 View  Result Date: 09/17/2021 CLINICAL DATA:  Sickle cell crisis.  Back pain. EXAM: PORTABLE CHEST 1 VIEW COMPARISON:  July 30, 2021 FINDINGS: Sickle cell changes to the bones with scattered sclerotic change The heart, hila, and mediastinum are normal. No pneumothorax. No nodules or masses. No focal infiltrates. IMPRESSION: Sickle cell changes of the bones with scattered sclerosis, stable. No acute abnormalities identified within the lungs. Electronically Signed   By: Dorise Bullion III M.D.   On: 09/17/2021 09:54    Microbiology: No results found for this or any previous visit (from the past 240 hour(s)).   Labs: Basic Metabolic Panel: Recent Labs  Lab 10/11/21 1701 10/12/21 0538 10/13/21 0530  NA 139 137 137  K 5.0 5.3* 4.5  CL 112* 115* 111  CO2 19* 18* 19*  GLUCOSE 133* 92 97  BUN 52* 46* 37*  CREATININE 3.72* 3.34* 2.98*  CALCIUM 8.8* 8.1* 8.4*  MG  --  1.9  --   PHOS  --  4.9* 4.6   Liver Function Tests: Recent Labs  Lab 10/11/21 1701 10/12/21 0538 10/13/21 0530  AST 39  --   --   ALT 31  --   --   ALKPHOS 166*  --   --   BILITOT 1.1  --   --   PROT 8.0  --   --   ALBUMIN 3.8 2.9* 3.5   No results for input(s): "LIPASE", "AMYLASE" in the last 168 hours. No results for input(s): "AMMONIA" in the last 168 hours. CBC: Recent Labs  Lab 10/11/21 1701 10/12/21 0538 10/13/21 0530  WBC 15.0* 14.3* 13.6*  NEUTROABS 5.8 5.0  --   HGB 9.7* 8.2* 8.6*  HCT 27.8* 23.5* 24.7*  MCV 107.3* 106.8* 107.9*   PLT 357 269 284   Cardiac Enzymes: No results for input(s): "CKTOTAL", "CKMB", "CKMBINDEX", "TROPONINI" in the last 168 hours. BNP: Invalid input(s): "POCBNP" CBG: No results for input(s): "GLUCAP" in the last 168 hours.  Time coordinating discharge: 50 minutes  Signed:  [name]  APRN, MSN, FNP-C Patient [place] [address] [place], [place] [address] [phone]  [facility] 10/13/2021, 11:26 AM

## 2021-10-18 ENCOUNTER — Other Ambulatory Visit: Payer: Self-pay | Admitting: Internal Medicine

## 2021-10-18 ENCOUNTER — Other Ambulatory Visit (HOSPITAL_COMMUNITY): Payer: Self-pay

## 2021-10-18 ENCOUNTER — Non-Acute Institutional Stay (HOSPITAL_COMMUNITY)
Admission: RE | Admit: 2021-10-18 | Discharge: 2021-10-18 | Disposition: A | Discharge status: Home / Self Care | Payer: Medicare Other | Source: Ambulatory Visit | Attending: Internal Medicine | Admitting: Internal Medicine

## 2021-10-18 VITALS — BP 115/80 | HR 89 | Temp 98.4°F | Resp 16

## 2021-10-18 DIAGNOSIS — D5700 Hb-SS disease with crisis, unspecified: Secondary | ICD-10-CM | POA: Diagnosis present

## 2021-10-18 DIAGNOSIS — G894 Chronic pain syndrome: Secondary | ICD-10-CM | POA: Diagnosis not present

## 2021-10-18 DIAGNOSIS — D571 Sickle-cell disease without crisis: Secondary | ICD-10-CM

## 2021-10-18 DIAGNOSIS — N184 Chronic kidney disease, stage 4 (severe): Secondary | ICD-10-CM | POA: Insufficient documentation

## 2021-10-18 DIAGNOSIS — F1120 Opioid dependence, uncomplicated: Secondary | ICD-10-CM | POA: Insufficient documentation

## 2021-10-18 LAB — RENAL FUNCTION PANEL
Albumin: 3.7 g/dL (ref 3.5–5.0)
Anion gap: 9 (ref 5–15)
BUN: 40 mg/dL — ABNORMAL HIGH (ref 6–20)
CO2: 22 mmol/L (ref 22–32)
Calcium: 8.7 mg/dL — ABNORMAL LOW (ref 8.9–10.3)
Chloride: 108 mmol/L (ref 98–111)
Creatinine, Ser: 3.23 mg/dL — ABNORMAL HIGH (ref 0.61–1.24)
GFR, Estimated: 24 mL/min — ABNORMAL LOW (ref >60)
Glucose, Bld: 92 mg/dL (ref 70–99)
Phosphorus: 3.6 mg/dL (ref 2.5–4.6)
Potassium: 4.6 mmol/L (ref 3.5–5.1)
Sodium: 139 mmol/L (ref 135–145)

## 2021-10-18 MED ORDER — SODIUM CHLORIDE 0.45 % IV BOLUS
500.0000 mL | Freq: Once | INTRAVENOUS | Status: AC
  Administered 2021-10-18: 500 mL via INTRAVENOUS

## 2021-10-18 MED ORDER — OXYCODONE HCL ER 15 MG PO T12A
15.0000 mg | EXTENDED_RELEASE_TABLET | Freq: Two times a day (BID) | ORAL | 0 refills | Status: DC
  Filled 2021-10-18: qty 60, 30d supply, fill #0

## 2021-10-18 MED ORDER — OXYCODONE HCL 10 MG PO TABS
10.0000 mg | ORAL_TABLET | Freq: Four times a day (QID) | ORAL | 0 refills | Status: DC | PRN
  Filled 2021-10-18: qty 60, 15d supply, fill #0

## 2021-10-18 NOTE — Progress Notes (Signed)
PATIENT CARE CENTER NOTE   Diagnosis: CKD    Provider: [place], [place], FNP   Procedure: IV fluid bolus and labs    Note:  Patient's labs drawn (Renal Panel). Patient's Creatinine was 3.23. Provider notified and order placed for IV fluid bolus. Patient received 500 cc IV fluid bolus of 0.45% NS. Patient tolerated well. Patient to come back to infusion center on Friday for recheck of labs. Alert, oriented and ambulatory at discharge.

## 2021-10-19 NOTE — Progress Notes (Signed)
[name] [name] is a 41 year old male with a medical history significant for sickle cell disease, chronic pain syndrome, opiate dependence and tolerance, and history of stage IV chronic kidney disease.  The patient underwent lab drawl in the day hospital on 10/18/2021.  At that time, serum creatinine was above baseline at 3.23.  Patient's baseline ranges from 2.5-2.8.  Patient will need to schedule a follow-up lab appointment on Tuesday, 10/25/2021 to repeat serum creatinine.  Please discuss the importance of following medication regimen consistently and renal diet to ensure positive outcomes.  He will also need to follow-up with his nephrology team at [place], MSN, [place] [address] [place], [place] [address] [phone]

## 2021-10-21 ENCOUNTER — Encounter (HOSPITAL_COMMUNITY): Payer: Medicare Other

## 2021-10-25 ENCOUNTER — Ambulatory Visit (INDEPENDENT_AMBULATORY_CARE_PROVIDER_SITE_OTHER): Payer: Medicare Other | Admitting: Family Medicine

## 2021-10-25 ENCOUNTER — Encounter: Payer: Self-pay | Admitting: Family Medicine

## 2021-10-25 VITALS — BP 108/76 | HR 77 | Temp 98.3°F | Ht 63.0 in | Wt 125.0 lb

## 2021-10-25 DIAGNOSIS — N184 Chronic kidney disease, stage 4 (severe): Secondary | ICD-10-CM | POA: Diagnosis not present

## 2021-10-25 DIAGNOSIS — E559 Vitamin D deficiency, unspecified: Secondary | ICD-10-CM | POA: Diagnosis not present

## 2021-10-25 DIAGNOSIS — D571 Sickle-cell disease without crisis: Secondary | ICD-10-CM

## 2021-10-25 DIAGNOSIS — G894 Chronic pain syndrome: Secondary | ICD-10-CM
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
## 2021-10-26 ENCOUNTER — Inpatient Hospital Stay (HOSPITAL_COMMUNITY)
Admission: EM | Admit: 2021-10-26 | Discharge: 2021-10-29 | DRG: 812 | Disposition: A | Discharge status: Home / Self Care | Payer: Medicare Other | Attending: Internal Medicine | Admitting: Internal Medicine

## 2021-10-26 ENCOUNTER — Other Ambulatory Visit: Payer: Self-pay

## 2021-10-26 ENCOUNTER — Other Ambulatory Visit: Payer: Self-pay | Admitting: Family Medicine

## 2021-10-26 ENCOUNTER — Encounter (HOSPITAL_COMMUNITY): Payer: Self-pay | Admitting: Emergency Medicine

## 2021-10-26 ENCOUNTER — Non-Acute Institutional Stay (HOSPITAL_COMMUNITY)
Admission: RE | Admit: 2021-10-26 | Discharge: 2021-10-26 | Disposition: A | Discharge status: Home / Self Care | Payer: Medicare Other | Source: Ambulatory Visit | Attending: Internal Medicine | Admitting: Internal Medicine

## 2021-10-26 DIAGNOSIS — K219 Gastro-esophageal reflux disease without esophagitis: Secondary | ICD-10-CM | POA: Diagnosis present

## 2021-10-26 DIAGNOSIS — F1120 Opioid dependence, uncomplicated: Secondary | ICD-10-CM | POA: Diagnosis present

## 2021-10-26 DIAGNOSIS — M79642 Pain in left hand: Secondary | ICD-10-CM | POA: Diagnosis present

## 2021-10-26 DIAGNOSIS — N184 Chronic kidney disease, stage 4 (severe): Secondary | ICD-10-CM

## 2021-10-26 DIAGNOSIS — D72829 Elevated white blood cell count, unspecified: Secondary | ICD-10-CM | POA: Diagnosis present

## 2021-10-26 DIAGNOSIS — F1210 Cannabis abuse, uncomplicated: Secondary | ICD-10-CM | POA: Diagnosis present

## 2021-10-26 DIAGNOSIS — M109 Gout, unspecified: Secondary | ICD-10-CM | POA: Diagnosis present

## 2021-10-26 DIAGNOSIS — Z886 Allergy status to analgesic agent status: Secondary | ICD-10-CM

## 2021-10-26 DIAGNOSIS — D5700 Hb-SS disease with crisis, unspecified: Principal | ICD-10-CM | POA: Diagnosis present

## 2021-10-26 DIAGNOSIS — E875 Hyperkalemia: Secondary | ICD-10-CM | POA: Diagnosis present

## 2021-10-26 DIAGNOSIS — D638 Anemia in other chronic diseases classified elsewhere: Secondary | ICD-10-CM | POA: Diagnosis present

## 2021-10-26 DIAGNOSIS — Z87891 Personal history of nicotine dependence: Secondary | ICD-10-CM

## 2021-10-26 DIAGNOSIS — G894 Chronic pain syndrome: Secondary | ICD-10-CM | POA: Diagnosis present

## 2021-10-26 DIAGNOSIS — N08 Glomerular disorders in diseases classified elsewhere: Secondary | ICD-10-CM | POA: Diagnosis present

## 2021-10-26 DIAGNOSIS — M79641 Pain in right hand: Secondary | ICD-10-CM | POA: Diagnosis present

## 2021-10-26 DIAGNOSIS — Z885 Allergy status to narcotic agent status: Secondary | ICD-10-CM

## 2021-10-26 DIAGNOSIS — Z79899 Other long term (current) drug therapy: Secondary | ICD-10-CM

## 2021-10-26 LAB — CMP14+CBC/D/PLT+FER+RETIC+V...
ALT: 61 IU/L — ABNORMAL HIGH (ref 0–44)
AST: 75 IU/L — ABNORMAL HIGH (ref 0–40)
Albumin/Globulin Ratio: 1.6 (ref 1.2–2.2)
Albumin: 4.2 g/dL (ref 4.1–5.1)
Alkaline Phosphatase: 249 IU/L — ABNORMAL HIGH (ref 44–121)
BUN/Creatinine Ratio: 10 (ref 9–20)
BUN: 35 mg/dL — ABNORMAL HIGH (ref 6–20)
Basophils Absolute: 0.1 10*3/uL (ref 0.0–0.2)
Basos: 0 %
Bilirubin Total: 0.9 mg/dL (ref 0.0–1.2)
CO2: 24 mmol/L (ref 20–29)
Calcium: 9.3 mg/dL (ref 8.7–10.2)
Chloride: 100 mmol/L (ref 96–106)
Creatinine, Ser: 3.61 mg/dL — ABNORMAL HIGH (ref 0.76–1.27)
EOS (ABSOLUTE): 0.4 10*3/uL (ref 0.0–0.4)
Eos: 3 %
Ferritin: 5749 ng/mL — ABNORMAL HIGH (ref 30–400)
Globulin, Total: 2.6 g/dL (ref 1.5–4.5)
Glucose: 103 mg/dL — ABNORMAL HIGH (ref 70–99)
Hematocrit: 25.1 % — ABNORMAL LOW (ref 37.5–51.0)
Hemoglobin: 9.2 g/dL — ABNORMAL LOW (ref 13.0–17.7)
Immature Grans (Abs): 0 10*3/uL (ref 0.0–0.1)
Immature Granulocytes: 0 %
Lymphocytes Absolute: 5.7 10*3/uL — ABNORMAL HIGH (ref 0.7–3.1)
Lymphs: 42 %
MCH: 39.1 pg — ABNORMAL HIGH (ref 26.6–33.0)
MCHC: 36.7 g/dL — ABNORMAL HIGH (ref 31.5–35.7)
MCV: 107 fL — ABNORMAL HIGH (ref 79–97)
Monocytes Absolute: 0.8 10*3/uL (ref 0.1–0.9)
Monocytes: 6 %
Neutrophils Absolute: 6.6 10*3/uL (ref 1.4–7.0)
Neutrophils: 49 %
Platelets: 194 10*3/uL (ref 150–450)
Potassium: 5.1 mmol/L (ref 3.5–5.2)
RBC: 2.35 x10E6/uL — CL (ref 4.14–5.80)
Retic Ct Pct: 4 % — ABNORMAL HIGH (ref 0.6–2.6)
Sodium: 137 mmol/L (ref 134–144)
Total Protein: 6.8 g/dL (ref 6.0–8.5)
Vit D, 25-Hydroxy: 17.4 ng/mL — ABNORMAL LOW (ref 30.0–100.0)
WBC: 13.6 10*3/uL — ABNORMAL HIGH (ref 3.4–10.8)
eGFR: 21 mL/min/{1.73_m2} — ABNORMAL LOW (ref >59)

## 2021-10-26 LAB — COMPREHENSIVE METABOLIC PANEL
ALT: 51 U/L — ABNORMAL HIGH (ref 0–44)
AST: 49 U/L — ABNORMAL HIGH (ref 15–41)
Albumin: 3.3 g/dL — ABNORMAL LOW (ref 3.5–5.0)
Alkaline Phosphatase: 190 U/L — ABNORMAL HIGH (ref 38–126)
Anion gap: 7 (ref 5–15)
BUN: 40 mg/dL — ABNORMAL HIGH (ref 6–20)
CO2: 25 mmol/L (ref 22–32)
Calcium: 8.6 mg/dL — ABNORMAL LOW (ref 8.9–10.3)
Chloride: 104 mmol/L (ref 98–111)
Creatinine, Ser: 3.63 mg/dL — ABNORMAL HIGH (ref 0.61–1.24)
GFR, Estimated: 21 mL/min — ABNORMAL LOW (ref >60)
Glucose, Bld: 102 mg/dL — ABNORMAL HIGH (ref 70–99)
Potassium: 5.9 mmol/L — ABNORMAL HIGH (ref 3.5–5.1)
Sodium: 136 mmol/L (ref 135–145)
Total Bilirubin: 0.9 mg/dL (ref 0.3–1.2)
Total Protein: 6.9 g/dL (ref 6.5–8.1)

## 2021-10-26 LAB — BASIC METABOLIC PANEL
Anion gap: 10 (ref 5–15)
BUN: 37 mg/dL — ABNORMAL HIGH (ref 6–20)
CO2: 27 mmol/L (ref 22–32)
Calcium: 8.5 mg/dL — ABNORMAL LOW (ref 8.9–10.3)
Chloride: 103 mmol/L (ref 98–111)
Creatinine, Ser: 3.82 mg/dL — ABNORMAL HIGH (ref 0.61–1.24)
GFR, Estimated: 20 mL/min — ABNORMAL LOW (ref >60)
Glucose, Bld: 122 mg/dL — ABNORMAL HIGH (ref 70–99)
Potassium: 4.9 mmol/L (ref 3.5–5.1)
Sodium: 140 mmol/L (ref 135–145)

## 2021-10-26 LAB — RETICULOCYTES
Immature Retic Fract: 33.1 % — ABNORMAL HIGH (ref 2.3–15.9)
RBC.: 2.13 MIL/uL — ABNORMAL LOW (ref 4.22–5.81)
Retic Count, Absolute: 84.1 10*3/uL (ref 19.0–186.0)
Retic Ct Pct: 4 % — ABNORMAL HIGH (ref 0.4–3.1)

## 2021-10-26 MED ORDER — HYDROMORPHONE HCL 1 MG/ML IJ SOLN: 0.5000 mg | INTRAMUSCULAR | Status: DC

## 2021-10-26 MED ORDER — HYDROMORPHONE HCL 2 MG/ML IJ SOLN: 2.0000 mg | INTRAMUSCULAR | Status: DC

## 2021-10-26 MED ORDER — SODIUM CHLORIDE 0.45 % IV BOLUS
500.0000 mL | Freq: Once | INTRAVENOUS | Status: AC
  Administered 2021-10-26: 500 mL via INTRAVENOUS

## 2021-10-26 MED ORDER — SODIUM CHLORIDE 0.9 % IV SOLN
12.5000 mg | Freq: Once | INTRAVENOUS | Status: AC
  Administered 2021-10-26: 12.5 mg via INTRAVENOUS
  Filled 2021-10-26: qty 12.5

## 2021-10-26 MED ORDER — OXYCODONE HCL 5 MG PO TABS: 10.0000 mg | ORAL_TABLET | Freq: Once | ORAL | Status: DC

## 2021-10-26 MED ORDER — HYDROMORPHONE HCL 2 MG/ML IJ SOLN
2.0000 mg | INTRAMUSCULAR | Status: AC
  Administered 2021-10-27: 2 mg via INTRAVENOUS
  Filled 2021-10-26: qty 1

## 2021-10-26 MED ORDER — HYDROMORPHONE HCL 2 MG/ML IJ SOLN
2.0000 mg | INTRAMUSCULAR | Status: AC
  Administered 2021-10-26: 2 mg via INTRAVENOUS
  Filled 2021-10-26: qty 1

## 2021-10-26 MED ORDER — ONDANSETRON HCL 4 MG/2ML IJ SOLN: 4.0000 mg | INTRAMUSCULAR | Status: DC | PRN

## 2021-10-26 NOTE — ED Notes (Signed)
Pt requesting bloodwork to be drawn with ultrasound IV

## 2021-10-26 NOTE — ED Triage Notes (Signed)
SCC that began about an hour ago. Was seen at the [facility] today and given fluids he states d/t his lab results.  Feels his knees are swollen and is having extreme pain in arms and legs.

## 2021-10-26 NOTE — ED Notes (Signed)
Patient requesting ultrasound guided IV due to being a difficult stick.

## 2021-10-26 NOTE — Progress Notes (Signed)
PATIENT CARE CENTER NOTE:  Diagnosis: CKD  Provider: [place] Hollis FNP  Procedure:  IV fluids bolus + labs   Patient seen at infusion center for complaints of bilateral hand swelling, pt states he feels like he needs IV fluids. Patient's labs drawn ( BMP). Creatinine 3.82. Provider notified. Order placed for IV fluids bolus. Patient received fluid bolus of 500cc 1/2 NS given over 1 hour. Pt tolerated without incident. Pt declined AVS. Alert, oriented and ambulatory at discharge.

## 2021-10-26 NOTE — ED Provider Triage Note (Signed)
Emergency Medicine Provider Triage Evaluation Note  [name] [name]. , a 41 y.o. male  was evaluated in triage.  Pt complains of sickle cell pain. Report body aches, pain to his bones along both hands and both knees.  R>L since today.  Seen at sickle cell clinic earlier.  No fever, cp, sob  Review of Systems  Positive: As above Negative: As above  Physical Exam  BP 109/76 (BP Location: Left Arm)   Pulse (!) 107   Temp 99.2 F (37.3 C) (Oral)   Resp 16   Ht 5\' 3"  (1.6 m)   Wt 52.9 kg   SpO2 98%   BMI 20.65 kg/m  Gen:   Awake, no distress   Resp:  Normal effort  MSK:   Moves extremities without difficulty  Other:    Medical Decision Making  Medically screening exam initiated at 7:43 PM.  Appropriate orders placed.  [name] [name]. was informed that the remainder of the evaluation will be completed by another provider, this initial triage assessment does not replace that evaluation, and the importance of remaining in the ED until their evaluation is complete.     Domenic Moras, PA-C 10/26/21 1946

## 2021-10-26 NOTE — ED Provider Notes (Signed)
[facility] DEPT Provider Note   CSN: [id number] Arrival date & time: 10/26/21  1912     History  Chief Complaint  Patient presents with   Sickle Cell Pain Crisis    [name] [name]. is a 41 y.o. male with a hx of sickle cell anemia, polysubstance abuse, and prior sickle cell complications who presents to the ED with complaints of arthralgias today. Patient reports pain to the bilateral hands and knees, worse on the left side, with some swelling to these locations. Worse with movement. No alleviating factors. Taking his home medication without much change. Feels like prior sickle cell pain. No recent traumatic injuries. Denies fever, chills, chest pain, dyspnea, numbness, weakness, abdominal pain, nausea or vomiting. Received fluids @ the sickle cell clinic earlier today.   HPI     Home Medications Prior to Admission medications   Medication Sig Start Date End Date Taking? Authorizing Provider  amitriptyline (ELAVIL) 25 MG tablet Take 50 mg by mouth at bedtime. 03/08/21   [provider]  calcitRIOL (ROCALTROL) 0.25 MCG capsule Take 0.25 mcg by mouth daily with breakfast. 11/16/20   [provider]  Deferiprone, Twice Daily, (FERRIPROX TWICE-A-DAY) 1000 MG TABS Take 2,000-3,000 mg by mouth daily. Takes 2 tablets (2000mg ) once daily every day except takes 3 tablets (3000mg ) on Tuesdays and Thursdays    [provider]  folic acid (FOLVITE) 1 MG tablet TAKE 1 TABLET BY MOUTH EVERY DAY Patient taking differently: Take 1 mg by mouth every morning. 03/22/20   [name], FNP  hydroxyurea (HYDREA) 500 MG capsule Take 500-1,000 mg by mouth See admin instructions. Take 1000 mg by mouth on Tuesday and Thursday mornings, take 500 mg on all other mornings 05/07/20   [provider]  oxyCODONE (OXYCONTIN) 15 mg 12 hr tablet Take 1 tablet (15 mg total) by mouth every 12 (twelve) hours. 10/18/21   [name], MD   Oxycodone HCl 10 MG TABS Take 1 tablet by mouth every 6 hours as needed (pain). 10/18/21   [name], MD  patiromer (VELTASSA) 8.4 g packet Take 8.4 g by mouth daily.    [provider]  sodium bicarbonate 650 MG tablet Take 1,950 mg by mouth 3 (three) times daily with meals. 11/07/18   [provider]  voxelotor (OXBRYTA) 500 MG TABS tablet Take 1,500 mg by mouth daily with breakfast.    [provider]      Allergies    Nsaids and Morphine and related    Review of Systems   Review of Systems  Constitutional:  Negative for chills and fever.  Respiratory:  Negative for cough and shortness of breath.   Cardiovascular:  Negative for chest pain.  Gastrointestinal:  Negative for abdominal pain, diarrhea, nausea and vomiting.  Musculoskeletal:  Positive for arthralgias and joint swelling.  Neurological:  Negative for syncope, weakness and numbness.  All other systems reviewed and are negative.   Physical Exam Updated Vital Signs BP 109/76 (BP Location: Left Arm)   Pulse (!) 107   Temp 99.2 F (37.3 C) (Oral)   Resp 16   Ht 5\' 3"  (1.6 m)   Wt 52.9 kg   SpO2 98%   BMI 20.65 kg/m  Physical Exam Vitals and nursing note reviewed.  Constitutional:      General: He is not in acute distress.    Appearance: He is well-developed. He is not toxic-appearing.  HENT:     Head: Normocephalic  and atraumatic.  Eyes:     General:        Right eye: No discharge.        Left eye: No discharge.     Conjunctiva/sclera: Conjunctivae normal.  Cardiovascular:     Rate and Rhythm: Normal rate and regular rhythm.     Comments: 2+ symmetric radial and DP pulses bilaterally. Pulmonary:     Effort: No respiratory distress.     Breath sounds: Normal breath sounds. No wheezing or rales.  Abdominal:     General: There is no distension.     Palpations: Abdomen is soft.     Tenderness: There is no abdominal tenderness.  Musculoskeletal:     Cervical back: Neck  supple.     Comments: Upper extremities: Patient has some mild swelling at the right first MCP joint region.  No overlying erythema, warmth, or open wounds.  He has intact active range of motion throughout the elbows, wrist, and all digits.  Tender to palpation to the bilateral first digits as well as MCP region and the thenar eminence.  Otherwise nontender. Back: No midline tenderness Lower extremities: Swelling to bilateral knees, right greater than left, small joint effusion noted.  No overlying erythema, increased warmth, or open wounds.  Patient has intact active range of motion throughout the lower extremities with the exception of mild right knee flexion limitation, however is able to flex past 90 degrees.  Tender to palpation to the bilateral knees.  Otherwise nontender.  Compartments are soft.  Skin:    General: Skin is warm and dry.  Neurological:     Mental Status: He is alert.     Comments: Clear speech.  Sensation grossly intact bilateral upper and lower extremities.  5 out of 5 symmetric strength with plantar dorsiflexion bilaterally as well as grip strength.  Able to perform okay sign, thumbs up, cross second/third digits bilaterally.  Psychiatric:        Behavior: Behavior normal.     ED Results / Procedures / Treatments   Labs (all labs ordered are listed, but only abnormal results are displayed) Labs Reviewed  COMPREHENSIVE METABOLIC PANEL  CBC WITH DIFFERENTIAL/PLATELET  RETICULOCYTES    EKG None  Radiology No results found.  Procedures Procedures    Medications Ordered in ED Medications  HYDROmorphone (DILAUDID) injection 0.5 mg (has no administration in time range)  HYDROmorphone (DILAUDID) injection 0.5 mg (has no administration in time range)  oxyCODONE (Oxy IR/ROXICODONE) immediate release tablet 10 mg (has no administration in time range)    ED Course/ Medical Decision Making/ A&P                           Medical Decision Making Amount and/or  Complexity of Data Reviewed Labs: ordered.  Risk Prescription drug management. Decision regarding hospitalization.  Patient presents to the ED with complaints of pain to the hands and knees that he reports is consistent with his sickle cell pain crises, this involves an extensive number of treatment options, and is a complaint that carries with it a high risk of complications and morbidity. Nontoxic, vitals with initial tachycardia which is improved at this point..   Additional history obtained:  Chart/nursing notes reviewed.  External records viewed including: labs from earlier today  Lab Tests:  I viewed & interpreted labs including:  CBC: Fairly similar to prior, mildly worsened hemoglobin and leukocytosis compared to labs earlier today CMP: CKD, does have hyperkalemia no EKG  changes, favor hemolysis, will repeat. Reticulocytes: Mildly elevated.  ED Course:  Sickle cell pain crisis protocol initiated.  Pain to the hands and knees, does have some swelling present, however no overlying erythema increased warmth, or fever in the ED to raise concern for septic joint.  All limbs are well-perfused.  Neurovascular intact distally x4.  No chest pain, cough, dyspnea, fevers, or exam findings concerning for acute chest syndrome.  Patient given 3 doses of Dilaudid in the ED, he remains very uncomfortable, he does not feel that he can go home.  Will discuss with medicine for admission.  Discussed w/ hospitalist Dr. Alcario Drought- accepts admission.   Portions of this note were generated with [employer]. Dictation errors may occur despite best attempts at proofreading.   Final Clinical Impression(s) / ED Diagnoses Final diagnoses:  Sickle cell pain crisis [facility])    Rx / DC Orders ED Discharge Orders     None         Amaryllis Dyke, PA-C 10/27/21 0420    Blanchie Dessert, MD 11/03/21 [phone]

## 2021-10-27 ENCOUNTER — Inpatient Hospital Stay (HOSPITAL_COMMUNITY): Payer: Medicare Other

## 2021-10-27 DIAGNOSIS — M109 Gout, unspecified: Secondary | ICD-10-CM | POA: Diagnosis present

## 2021-10-27 DIAGNOSIS — Z87891 Personal history of nicotine dependence: Secondary | ICD-10-CM | POA: Diagnosis not present

## 2021-10-27 DIAGNOSIS — F1120 Opioid dependence, uncomplicated: Secondary | ICD-10-CM | POA: Diagnosis present

## 2021-10-27 DIAGNOSIS — K219 Gastro-esophageal reflux disease without esophagitis: Secondary | ICD-10-CM | POA: Diagnosis present

## 2021-10-27 DIAGNOSIS — Z885 Allergy status to narcotic agent status: Secondary | ICD-10-CM | POA: Diagnosis not present

## 2021-10-27 DIAGNOSIS — N184 Chronic kidney disease, stage 4 (severe): Secondary | ICD-10-CM | POA: Diagnosis present

## 2021-10-27 DIAGNOSIS — M79641 Pain in right hand: Secondary | ICD-10-CM | POA: Diagnosis present

## 2021-10-27 DIAGNOSIS — D5700 Hb-SS disease with crisis, unspecified: Secondary | ICD-10-CM | POA: Diagnosis present

## 2021-10-27 DIAGNOSIS — D638 Anemia in other chronic diseases classified elsewhere: Secondary | ICD-10-CM | POA: Diagnosis present

## 2021-10-27 DIAGNOSIS — N08 Glomerular disorders in diseases classified elsewhere: Secondary | ICD-10-CM | POA: Diagnosis present

## 2021-10-27 DIAGNOSIS — D72829 Elevated white blood cell count, unspecified: Secondary | ICD-10-CM | POA: Diagnosis present

## 2021-10-27 DIAGNOSIS — G894 Chronic pain syndrome: Secondary | ICD-10-CM | POA: Diagnosis present

## 2021-10-27 DIAGNOSIS — E875 Hyperkalemia: Secondary | ICD-10-CM | POA: Diagnosis present

## 2021-10-27 DIAGNOSIS — Z886 Allergy status to analgesic agent status: Secondary | ICD-10-CM | POA: Diagnosis not present

## 2021-10-27 DIAGNOSIS — Z79899 Other long term (current) drug therapy: Secondary | ICD-10-CM | POA: Diagnosis not present

## 2021-10-27 DIAGNOSIS — M79642 Pain in left hand: Secondary | ICD-10-CM

## 2021-10-27 DIAGNOSIS — F1210 Cannabis abuse, uncomplicated: Secondary | ICD-10-CM | POA: Diagnosis present

## 2021-10-27 LAB — CBC WITH DIFFERENTIAL/PLATELET
Abs Immature Granulocytes: 0.05 10*3/uL (ref 0.00–0.07)
Basophils Absolute: 0 10*3/uL (ref 0.0–0.1)
Basophils Relative: 0 %
Eosinophils Absolute: 0.4 10*3/uL (ref 0.0–0.5)
Eosinophils Relative: 3 %
HCT: 22.9 % — ABNORMAL LOW (ref 39.0–52.0)
Hemoglobin: 8.2 g/dL — ABNORMAL LOW (ref 13.0–17.0)
Immature Granulocytes: 0 %
Lymphocytes Relative: 34 %
Lymphs Abs: 5.5 10*3/uL — ABNORMAL HIGH (ref 0.7–4.0)
MCH: 38.5 pg — ABNORMAL HIGH (ref 26.0–34.0)
MCHC: 35.8 g/dL (ref 30.0–36.0)
MCV: 107.5 fL — ABNORMAL HIGH (ref 80.0–100.0)
Monocytes Absolute: 1.4 10*3/uL — ABNORMAL HIGH (ref 0.1–1.0)
Monocytes Relative: 9 %
Neutro Abs: 8.7 10*3/uL — ABNORMAL HIGH (ref 1.7–7.7)
Neutrophils Relative %: 54 %
Platelets: 177 10*3/uL (ref 150–400)
RBC: 2.13 MIL/uL — ABNORMAL LOW (ref 4.22–5.81)
WBC: 16 10*3/uL — ABNORMAL HIGH (ref 4.0–10.5)
nRBC: 0 % (ref 0.0–0.2)

## 2021-10-27 LAB — URIC ACID: Uric Acid, Serum: 7 mg/dL (ref 3.7–8.6)

## 2021-10-27 LAB — POTASSIUM: Potassium: 5.1 mmol/L (ref 3.5–5.1)

## 2021-10-27 MED ORDER — PATIROMER SORBITEX CALCIUM 8.4 G PO PACK
16.8000 g | PACK | Freq: Every day | ORAL | Status: DC
  Administered 2021-10-27 – 2021-10-29 (×3): 16.8 g via ORAL
  Filled 2021-10-27 (×3): qty 2

## 2021-10-27 MED ORDER — DIPHENHYDRAMINE HCL 25 MG PO CAPS: 25.0000 mg | ORAL_CAPSULE | ORAL | Status: DC | PRN

## 2021-10-27 MED ORDER — DEFERIPRONE (TWICE DAILY) 1000 MG PO TABS
2000.0000 mg | ORAL_TABLET | ORAL | Status: DC
  Administered 2021-10-29: 2000 mg via ORAL

## 2021-10-27 MED ORDER — FOLIC ACID 1 MG PO TABS
1.0000 mg | ORAL_TABLET | Freq: Every morning | ORAL | Status: DC
  Administered 2021-10-27 – 2021-10-29 (×3): 1 mg via ORAL
  Filled 2021-10-27 (×3): qty 1

## 2021-10-27 MED ORDER — HEPARIN SODIUM (PORCINE) 5000 UNIT/ML IJ SOLN
5000.0000 [IU] | Freq: Three times a day (TID) | INTRAMUSCULAR | Status: DC
  Filled 2021-10-27 (×3): qty 1

## 2021-10-27 MED ORDER — OXYCODONE HCL ER 15 MG PO T12A
15.0000 mg | EXTENDED_RELEASE_TABLET | Freq: Two times a day (BID) | ORAL | Status: DC
  Administered 2021-10-27 – 2021-10-29 (×5): 15 mg via ORAL
  Filled 2021-10-27 (×4): qty 1

## 2021-10-27 MED ORDER — SODIUM CHLORIDE 0.45 % IV SOLN: INTRAVENOUS | Status: DC

## 2021-10-27 MED ORDER — NALOXONE HCL 0.4 MG/ML IJ SOLN: 0.4000 mg | INTRAMUSCULAR | Status: DC | PRN

## 2021-10-27 MED ORDER — SODIUM CHLORIDE 0.9% FLUSH: 9.0000 mL | INTRAVENOUS | Status: DC | PRN

## 2021-10-27 MED ORDER — AMITRIPTYLINE HCL 25 MG PO TABS
50.0000 mg | ORAL_TABLET | Freq: Every day | ORAL | Status: DC
  Administered 2021-10-27 – 2021-10-28 (×2): 50 mg via ORAL
  Filled 2021-10-27 (×2): qty 2

## 2021-10-27 MED ORDER — HYDROXYUREA 500 MG PO CAPS
500.0000 mg | ORAL_CAPSULE | ORAL | Status: DC
  Administered 2021-10-28 – 2021-10-29 (×2): 500 mg via ORAL
  Filled 2021-10-27 (×2): qty 1

## 2021-10-27 MED ORDER — ONDANSETRON HCL 4 MG/2ML IJ SOLN: 4.0000 mg | Freq: Four times a day (QID) | INTRAMUSCULAR | Status: DC | PRN

## 2021-10-27 MED ORDER — SODIUM BICARBONATE 650 MG PO TABS
1950.0000 mg | ORAL_TABLET | Freq: Three times a day (TID) | ORAL | Status: DC
  Administered 2021-10-27 – 2021-10-29 (×6): 1950 mg via ORAL
  Filled 2021-10-27 (×7): qty 3

## 2021-10-27 MED ORDER — HYDROMORPHONE HCL 2 MG/ML IJ SOLN
2.0000 mg | Freq: Once | INTRAMUSCULAR | Status: AC
  Administered 2021-10-27: 2 mg via INTRAVENOUS
  Filled 2021-10-27: qty 1

## 2021-10-27 MED ORDER — POLYETHYLENE GLYCOL 3350 17 G PO PACK: 17.0000 g | PACK | Freq: Every day | ORAL | Status: DC | PRN

## 2021-10-27 MED ORDER — HYDROMORPHONE 1 MG/ML IV SOLN
INTRAVENOUS | Status: DC
  Administered 2021-10-27: 4 mg via INTRAVENOUS
  Administered 2021-10-27: 2.5 mg via INTRAVENOUS
  Administered 2021-10-28: 2 mg via INTRAVENOUS
  Administered 2021-10-28: 1.5 mg via INTRAVENOUS
  Filled 2021-10-27: qty 30

## 2021-10-27 MED ORDER — DEFERIPRONE (TWICE DAILY) 1000 MG PO TABS: 2000.0000 mg | ORAL_TABLET | Freq: Every day | ORAL | Status: DC

## 2021-10-27 MED ORDER — HYDROXYUREA 500 MG PO CAPS
1000.0000 mg | ORAL_CAPSULE | ORAL | Status: DC
  Administered 2021-10-27: 1000 mg via ORAL
  Filled 2021-10-27: qty 2

## 2021-10-27 MED ORDER — HYDROMORPHONE HCL 1 MG/ML IJ SOLN
1.0000 mg | INTRAMUSCULAR | Status: DC | PRN
  Administered 2021-10-27 (×2): 1 mg via INTRAVENOUS
  Filled 2021-10-27 (×2): qty 1

## 2021-10-27 MED ORDER — NON FORMULARY: 1000.0000 mg | Freq: Two times a day (BID) | Status: DC

## 2021-10-27 MED ORDER — SENNOSIDES-DOCUSATE SODIUM 8.6-50 MG PO TABS
1.0000 | ORAL_TABLET | Freq: Two times a day (BID) | ORAL | Status: DC
  Administered 2021-10-28: 1 via ORAL
  Filled 2021-10-27 (×4): qty 1

## 2021-10-27 MED ORDER — VOXELOTOR 500 MG PO TABS
1500.0000 mg | ORAL_TABLET | Freq: Every day | ORAL | Status: DC
  Administered 2021-10-27 – 2021-10-29 (×3): 1500 mg via ORAL

## 2021-10-27 MED ORDER — CALCITRIOL 0.25 MCG PO CAPS
0.2500 ug | ORAL_CAPSULE | Freq: Every day | ORAL | Status: DC
  Administered 2021-10-27 – 2021-10-29 (×3): 0.25 ug via ORAL
  Filled 2021-10-27 (×3): qty 1

## 2021-10-27 MED ORDER — DEFERIPRONE (TWICE DAILY) 1000 MG PO TABS
3000.0000 mg | ORAL_TABLET | ORAL | Status: DC
  Administered 2021-10-27: 3000 mg via ORAL

## 2021-10-27 NOTE — H&P (Signed)
History and Physical    Patient: [name] [name]. CMK:[id number] DOB: 11-05-1980 DOA: 10/26/2021 DOS: the patient was seen and examined on 10/27/2021 PCP: [name], FNP  Patient coming from: Home  Chief Complaint:  Chief Complaint  Patient presents with   Sickle Cell Pain Crisis   HPI: [name] [name]. is a 41 y.o. male with medical history significant of HGB SS dz, frequent admits for pain, polysubstance abuse including THC and h/o cocaine abuse, CKD 4.  Pt presents to ED with c/o sickle cell pain crisis.  Patient reports pain to the bilateral hands and knees, worse on the left side, with some swelling to these locations. Worse with movement. No alleviating factors. Taking his home medication without much change. Feels like prior sickle cell pain. No recent traumatic injuries.  Denies fever, chills, chest pain, dyspnea, numbness, weakness, abdominal pain, nausea or vomiting. Received fluids @ the sickle cell clinic earlier today.     Review of Systems: As mentioned in the history of present illness. All other systems reviewed and are negative. Past Medical History:  Diagnosis Date   Abscess of right iliac fossa 09/24/2018   required Perc Drain 09/24/2018   Arachnoid Cyst of brain bilaterally    "2 really small ones in the back of my head; inside; saw them w/MRI" (09/25/2012)   Bacterial pneumonia ~ 2012   "caught it here in the hospital" (09/25/2012)   Chronic kidney disease    "from my sickle cell" (09/25/2012)   CKD (chronic kidney disease), stage II    Colitis 04/19/2017   CT scan abd/ pelvis   GERD (gastroesophageal reflux disease)    "after I eat alot of spicey foods" (09/25/2012)   Gynecomastia, male 07/10/2012   History of blood transfusion    "always related to sickle cell crisis" (09/25/2012)   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   Noted in noted from Hematology notes at [facility]    "take RX qd to prevent them"  (09/25/2012)   Opioid dependence with withdrawal ([place]) 08/30/2012   Renal insufficiency    Sickle cell anemia (HCC)    Sickle cell crisis ([place]) 09/25/2012   Sickle cell nephropathy ([place]) 07/10/2012   Sinus tachycardia    Tachycardia with heart rate 121-140 beats per minute with ambulation 08/04/2016   Past Surgical History:  Procedure Laterality Date   ABCESS DRAINAGE     CHOLECYSTECTOMY  ~ 2012   COLONOSCOPY N/A 04/23/2017   Procedure: COLONOSCOPY;  Surgeon: Irene Shipper, MD;  Location: [facility];  Service: Endoscopy;  Laterality: N/A;   IR FLUORO GUIDE CV LINE RIGHT  12/17/2016   IR FLUORO GUIDE CV LINE RIGHT  02/07/2021   IR RADIOLOGIST EVAL & MGMT  10/02/2018   IR RADIOLOGIST EVAL & MGMT  10/15/2018   IR REMOVAL TUN CV CATH W/O FL  12/21/2016   IR REMOVAL TUN CV CATH W/O FL  02/11/2021   IR US GUIDE VASC ACCESS RIGHT  12/17/2016   IR US GUIDE VASC ACCESS RIGHT  02/07/2021   spleenectomy     Social History:  reports that he has quit smoking. His smoking use included cigarettes. He smoked an average of .1 packs per day. He has never used smokeless tobacco. He reports current alcohol use. He reports current drug use. Drug: Marijuana.  Allergies  Allergen Reactions   Nsaids Other (See Comments)    Kidney disease   Morphine And Related Other (See Comments)    "Real  bad headaches"     Family History  Problem Relation Age of Onset   Breast cancer Mother     Prior to Admission medications   Medication Sig Start Date End Date Taking? Authorizing Provider  amitriptyline (ELAVIL) 25 MG tablet Take 50 mg by mouth at bedtime. 03/08/21  Yes [provider]  calcitRIOL (ROCALTROL) 0.25 MCG capsule Take 0.25 mcg by mouth daily with breakfast. 11/16/20  Yes [provider]  Deferiprone, Twice Daily, (FERRIPROX TWICE-A-DAY) 1000 MG TABS Take 2,000-3,000 mg by mouth daily. Takes 2 tablets (2000mg ) once daily every day except takes 3 tablets (3000mg ) on Tuesdays and Thursdays    Yes [provider]  folic acid (FOLVITE) 1 MG tablet TAKE 1 TABLET BY MOUTH EVERY DAY Patient taking differently: Take 1 mg by mouth every morning. 03/22/20  Yes [name], FNP  hydroxyurea (HYDREA) 500 MG capsule Take 500-1,000 mg by mouth See admin instructions. Take 1000 mg by mouth on Tuesday and Thursday mornings, take 500 mg on all other mornings 05/07/20  Yes [provider]  oxyCODONE (OXYCONTIN) 15 mg 12 hr tablet Take 1 tablet (15 mg total) by mouth every 12 (twelve) hours. 10/18/21  Yes [name], MD  Oxycodone HCl 10 MG TABS Take 1 tablet by mouth every 6 hours as needed (pain). 10/18/21  Yes [name], MD  sodium bicarbonate 650 MG tablet Take 1,950 mg by mouth 3 (three) times daily with meals. 11/07/18  Yes [provider]  VELTASSA 16.8 g PACK Take 1 packet by mouth daily. 10/10/21  Yes [provider]  voxelotor (OXBRYTA) 500 MG TABS tablet Take 1,500 mg by mouth daily with breakfast.   Yes [provider]    Physical Exam: Vitals:   10/27/21 0100 10/27/21 0130 10/27/21 0200 10/27/21 0313  BP: 109/73 114/61 106/63 109/66  Pulse: 91 85 77 78  Resp: 16 20 10 10   Temp:      TempSrc:      SpO2: 98% 97% 99% 97%  Weight:      Height:       Constitutional: NAD, calm, comfortable Eyes: PERRL, lids and conjunctivae normal ENMT: Mucous membranes are moist. Posterior pharynx clear of any exudate or lesions.Normal dentition.  Neck: normal, supple, no masses, no thyromegaly Respiratory: clear to auscultation bilaterally, no wheezing, no crackles. Normal respiratory effort. No accessory muscle use.  Cardiovascular: Regular rate and rhythm, no murmurs / rubs / gallops. No extremity edema. 2+ pedal pulses. No carotid bruits.  Abdomen: no tenderness, no masses palpated. No hepatosplenomegaly. Bowel sounds positive.  Musculoskeletal: TTP over first and 2nd MCP joints B, thenar eminence B Skin: no rashes, lesions, ulcers.  No induration Neurologic: CN 2-12 grossly intact. Sensation intact, DTR normal. Strength 5/5 in all 4.  Psychiatric: Normal judgment and insight. Alert and oriented x 3. Normal mood.   Data Reviewed:    CBC    Component Value Date/Time   WBC 16.0 (H) 10/26/2021 2320   RBC 2.13 (L) 10/26/2021 2320   RBC 2.13 (L) 10/26/2021 2320   HGB 8.2 (L) 10/26/2021 2320   HGB 9.2 (L) 10/25/2021 1125   HCT 22.9 (L) 10/26/2021 2320   HCT 25.1 (L) 10/25/2021 1125   PLT 177 10/26/2021 2320   PLT 194 10/25/2021 1125   MCV 107.5 (H) 10/26/2021 2320   MCV 107 (H) 10/25/2021 1125   MCH 38.5 (H) 10/26/2021 2320   MCHC 35.8 10/26/2021 2320   RDW Not Measured 10/26/2021 2320  RDW 22.3 (H) 07/26/2021 1047   LYMPHSABS 5.5 (H) 10/26/2021 2320   LYMPHSABS 5.7 (H) 10/25/2021 1125   MONOABS 1.4 (H) 10/26/2021 2320   EOSABS 0.4 10/26/2021 2320   EOSABS 0.4 10/25/2021 1125   BASOSABS 0.0 10/26/2021 2320   BASOSABS 0.1 10/25/2021 1125      Latest Ref Rng & Units 10/26/2021   11:20 PM 10/26/2021   11:48 AM 10/25/2021   11:25 AM  CMP  Glucose 70 - 99 mg/dL 102  122  103   BUN 6 - 20 mg/dL 40  37  35   Creatinine 0.61 - 1.24 mg/dL 3.63  3.82  3.61   Sodium 135 - 145 mmol/L 136  140  137   Potassium 3.5 - 5.1 mmol/L 5.9  4.9  5.1   Chloride 98 - 111 mmol/L 104  103  100   CO2 22 - 32 mmol/L 25  27  24    Calcium 8.9 - 10.3 mg/dL 8.6  8.5  9.3   Total Protein 6.5 - 8.1 g/dL 6.9   6.8   Total Bilirubin 0.3 - 1.2 mg/dL 0.9   0.9   Alkaline Phos 38 - 126 U/L 190   249   AST 15 - 41 U/L 49   75   ALT 0 - 44 U/L 51   61      Assessment and Plan: * Acute sickle cell crisis (HCC)  Sickle cell pathway Cont Oxycontin Dilaudid PCA PRN dilaudid until we can get him on PCA No NSAIDS due to kidney fxn Cont hydrea Cont Oxbryta Half NS at 125  Bilateral hand pain And B knee pain. Pt reports sickle cell pain crisis.  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 Though EDP exam findings sound more suspicious for  undiagnosed rheumatoid arthritis?  Autoimmune dz certainly possible.  Looks like he already has warm autoantibodies (suggesting to me he may have a predisposition to autoimmune dz already). Gout also a possibility given renal fxn (though seems very bilateral and symmetric for this). Will order a work up for RA given the physical exam findings today. X ray B hands and B knees Rheumatoid factor ANA CCP Will also check a uric acid level given the renal fxn  Hyperkalemia No EKG changes. Suspect hemolysis responsible for the 5.9 today in ED since K was normal a mere 12h prior to this as well as yesterday on BMP. Repeat K pending Tele monitor  CKD (chronic kidney disease), stage IV (HCC) Baseline creat now ~2.9 3.6 yesterday and today. No NSAIDS. Cont home meds: bicarb + veltassa. IVF Daily BMP while in hospital      Advance Care Planning:   Code Status: Full Code  Consults: None  Family Communication: No family in room  Severity of Illness: The appropriate patient status for this patient is INPATIENT. Inpatient status is judged to be reasonable and necessary in order to provide the required intensity of service to ensure the patient's safety. The patient's presenting symptoms, physical exam findings, and initial radiographic and laboratory data in the context of their chronic comorbidities is felt to place them at high risk for further clinical deterioration. Furthermore, it is not anticipated that the patient will be medically stable for discharge from the hospital within 2 midnights of admission.   * I certify that at the point of admission it is my clinical judgment that the patient will require inpatient hospital care spanning beyond 2 midnights from the point of admission due to  high intensity of service, high risk for further deterioration and high frequency of surveillance required.*  Author: Etta Quill., DO 10/27/2021 3:16 AM  For on call review [email or web address].

## 2021-10-27 NOTE — Assessment & Plan Note (Addendum)
  1. Sickle cell pathway 2. Cont Oxycontin 3. Dilaudid PCA 1. PRN dilaudid until we can get him on PCA 4. No NSAIDS due to kidney fxn 5. Cont hydrea 6. Cont Oxbryta 7. Half NS at 125

## 2021-10-27 NOTE — Assessment & Plan Note (Signed)
Baseline creat now ~2.9 3.6 yesterday and today. 1. No NSAIDS. 2. Cont home meds: bicarb + veltassa. 3. IVF 4. Daily BMP while in hospital

## 2021-10-27 NOTE — Progress Notes (Signed)
[name] [name] is a 41 year old male with a medical history significant for sickle cell disease, chronic pain syndrome, opiate dependence and tolerance, history of polysubstance abuse, stage IV chronic kidney disease, and anemia of chronic disease was admitted overnight for sickle cell pain crisis.  Patient is complaining of swelling to bilateral hands and knees over the past 24 hours.  He is also complaining of pain that is consistent with his typical sickle cell pain.  Pain is primarily to knees and low back.  He rates his pain at 8/10.  He denies any headache, dizziness, shortness of breath, urinary symptoms, nausea, vomiting, or diarrhea.  Care plan: Continue medications as ordered We will restart oxycodone as pain intensity improves Monitor labs in a.m. Sickle cell team will reassess on 10/28/2021  [name]  APRN, MSN, FNP-C Patient [facility] [address] [place], [place] [address] [phone]

## 2021-10-27 NOTE — Assessment & Plan Note (Addendum)
No EKG changes. Suspect hemolysis responsible for the 5.9 today in ED since K was normal a mere 12h prior to this as well as yesterday on BMP. 1. Repeat K pending 2. Tele monitor

## 2021-10-27 NOTE — Assessment & Plan Note (Addendum)
And B knee pain. Pt reports sickle cell pain crisis.  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 Though EDP exam findings sound more suspicious for undiagnosed rheumatoid arthritis?  Autoimmune dz certainly possible.  Looks like he already has warm autoantibodies (suggesting to me he may have a predisposition to autoimmune dz already). Gout also a possibility given renal fxn (though seems very bilateral and symmetric for this). Will order a work up for RA given the physical exam findings today. 1. X ray B hands and B knees 2. Rheumatoid factor 3. ANA 4. CCP 5. Will also check a uric acid level given the renal fxn

## 2021-10-28 DIAGNOSIS — D5700 Hb-SS disease with crisis, unspecified: Secondary | ICD-10-CM | POA: Diagnosis not present

## 2021-10-28 LAB — BASIC METABOLIC PANEL
Anion gap: 7 (ref 5–15)
Anion gap: 9 (ref 5–15)
BUN: 41 mg/dL — ABNORMAL HIGH (ref 6–20)
BUN: 37 mg/dL — ABNORMAL HIGH (ref 6–20)
CO2: 16 mmol/L — ABNORMAL LOW (ref 22–32)
CO2: 20 mmol/L — ABNORMAL LOW (ref 22–32)
Calcium: 8.1 mg/dL — ABNORMAL LOW (ref 8.9–10.3)
Calcium: 8.9 mg/dL (ref 8.9–10.3)
Chloride: 111 mmol/L (ref 98–111)
Chloride: 112 mmol/L — ABNORMAL HIGH (ref 98–111)
Creatinine, Ser: 2.97 mg/dL — ABNORMAL HIGH (ref 0.61–1.24)
Creatinine, Ser: 3.06 mg/dL — ABNORMAL HIGH (ref 0.61–1.24)
GFR, Estimated: 27 mL/min — ABNORMAL LOW (ref >60)
GFR, Estimated: 26 mL/min — ABNORMAL LOW (ref >60)
Glucose, Bld: 82 mg/dL (ref 70–99)
Glucose, Bld: 99 mg/dL (ref 70–99)
Potassium: 6.4 mmol/L (ref 3.5–5.1)
Potassium: 5.8 mmol/L — ABNORMAL HIGH (ref 3.5–5.1)
Sodium: 134 mmol/L — ABNORMAL LOW (ref 135–145)
Sodium: 141 mmol/L (ref 135–145)

## 2021-10-28 LAB — CBC
HCT: 23.5 % — ABNORMAL LOW (ref 39.0–52.0)
Hemoglobin: 8.6 g/dL — ABNORMAL LOW (ref 13.0–17.0)
MCH: 39.4 pg — ABNORMAL HIGH (ref 26.0–34.0)
MCHC: 36.6 g/dL — ABNORMAL HIGH (ref 30.0–36.0)
MCV: 107.8 fL — ABNORMAL HIGH (ref 80.0–100.0)
Platelets: 164 10*3/uL (ref 150–400)
RBC: 2.18 MIL/uL — ABNORMAL LOW (ref 4.22–5.81)
WBC: 13.8 10*3/uL — ABNORMAL HIGH (ref 4.0–10.5)
nRBC: 0 % (ref 0.0–0.2)

## 2021-10-28 LAB — ANA W/REFLEX IF POSITIVE: Anti Nuclear Antibody (ANA): NEGATIVE

## 2021-10-28 LAB — RHEUMATOID FACTOR: Rheumatoid fact SerPl-aCnc: <10 IU/mL (ref <14.0)

## 2021-10-28 LAB — CYCLIC CITRUL PEPTIDE ANTIBODY, IGG/IGA: CCP Antibodies IgG/IgA: 4 units (ref 0–19)

## 2021-10-28 MED ORDER — OXYCODONE HCL 5 MG PO TABS: 10.0000 mg | ORAL_TABLET | ORAL | Status: DC | PRN

## 2021-10-28 MED ORDER — SODIUM POLYSTYRENE SULFONATE 15 GM/60ML PO SUSP
30.0000 g | Freq: Once | ORAL | Status: AC
  Administered 2021-10-28: 30 g via ORAL
  Filled 2021-10-28: qty 120

## 2021-10-28 MED ORDER — HYDROMORPHONE 1 MG/ML IV SOLN
INTRAVENOUS | Status: DC
  Administered 2021-10-28: 0.5 mg via INTRAVENOUS
  Administered 2021-10-28: 1.5 mg via INTRAVENOUS
  Administered 2021-10-29: 3.5 mg via INTRAVENOUS
  Administered 2021-10-29: 1 mg via INTRAVENOUS
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
## 2021-10-28 NOTE — Progress Notes (Signed)
Subjective: [name] [name] is a 41 year old male with a medical history significant for sickle cell disease type SS, chronic pain syndrome, stage IV chronic kidney disease, history of polysubstance abuse, opiate dependence and tolerance, and history of anemia of chronic disease was admitted for sickle cell pain crisis.  Also of note, patient has had worsening renal functioning over the past several weeks.  He establish care with [facility] nephrology 2 weeks prior.  Patient says that he has been taking all prescribed medications consistently without any interruptions.  Patient denies any illicit drug use at this time. He continues to have pain primarily to right knee and hands bilaterally.  He rates his pain as 6/10.  He denies any heart palpitations, shortness of breath, dizziness, urinary symptoms, chest pain, nausea, vomiting, or diarrhea.  Objective:  Vital signs in last 24 hours:  Vitals:   10/27/21 2321 10/28/21 0338 10/28/21 0748 10/28/21 0915  BP:  111/78  108/76  Pulse:  82  85  Resp: 16 16 14 16   Temp:    98.6 F (37 C)  TempSrc:  Oral  Oral  SpO2: 98% 97% 99% 98%  Weight:      Height:        Intake/Output from previous day:   Intake/Output Summary (Last 24 hours) at 10/28/2021 1159 Last data filed at 10/28/2021 0448 Gross per 24 hour  Intake 3519.75 ml  Output 1000 ml  Net 2519.75 ml    Physical Exam: General: Alert, awake, oriented x3, in no acute distress.  HEENT: [place]/AT PEERL, EOMI Neck: Trachea midline,  no masses, no thyromegal,y no JVD, no carotid bruit OROPHARYNX:  Moist, No exudate/ erythema/lesions.  Heart: Regular rate and rhythm, Murmur present, rubs, gallops, PMI non-displaced, no heaves or thrills on palpation.  Lungs: Clear to auscultation, no wheezing or rhonchi noted. No increased vocal fremitus resonant to percussion  Abdomen: Soft, nontender, nondistended, positive bowel sounds, no masses no hepatosplenomegaly noted..  Neuro: No focal neurological  deficits noted cranial nerves II through XII grossly intact. DTRs 2+ bilaterally upper and lower extremities. Strength 5 out of 5 in bilateral upper and lower extremities. Musculoskeletal: No warm swelling or erythema around joints, no spinal tenderness noted. Psychiatric: Patient alert and oriented x3, good insight and cognition, good recent to remote recall. Lymph node survey: No cervical axillary or inguinal lymphadenopathy noted.  Lab Results:  Basic Metabolic Panel:    Component Value Date/Time   NA 134 (L) 10/28/2021 0736   NA 137 10/25/2021 1125   K 6.4 (HH) 10/28/2021 0736   CL 111 10/28/2021 0736   CO2 16 (L) 10/28/2021 0736   BUN 41 (H) 10/28/2021 0736   BUN 35 (H) 10/25/2021 1125   CREATININE 2.97 (H) 10/28/2021 0736   CREATININE 1.45 (H) 05/12/2014 1430   GLUCOSE 82 10/28/2021 0736   CALCIUM 8.1 (L) 10/28/2021 0736   CBC:    Component Value Date/Time   WBC 13.8 (H) 10/28/2021 0911   HGB 8.6 (L) 10/28/2021 0911   HGB 9.2 (L) 10/25/2021 1125   HCT 23.5 (L) 10/28/2021 0911   HCT 25.1 (L) 10/25/2021 1125   PLT 164 10/28/2021 0911   PLT 194 10/25/2021 1125   MCV 107.8 (H) 10/28/2021 0911   MCV 107 (H) 10/25/2021 1125   NEUTROABS 8.7 (H) 10/26/2021 2320   NEUTROABS 6.6 10/25/2021 1125   LYMPHSABS 5.5 (H) 10/26/2021 2320   LYMPHSABS 5.7 (H) 10/25/2021 1125   MONOABS 1.4 (H) 10/26/2021 2320   EOSABS 0.4 10/26/2021 2320  EOSABS 0.4 10/25/2021 1125   BASOSABS 0.0 10/26/2021 2320   BASOSABS 0.1 10/25/2021 1125    No results found for this or any previous visit (from the past 240 hour(s)).  Studies/Results: DG Knee 1-2 Views Right  Result Date: 10/27/2021 CLINICAL DATA:  Sickle cell crisis, avian from sickle-cell versus gout, RA, or autoimmune arthritis. Swollen and painful joints. EXAM: RIGHT KNEE - 1-2 VIEW COMPARISON:  04/11/2021. FINDINGS: No acute fracture or dislocation. Extensive bone infarcts are noted in the medial and lateral femoral condyles with  subchondral collapse. An osteochondral defect is noted in the lateral femoral condyle. There is a new lucency in the medial femoral condyle suggesting developing osteochondral defect. A bone infarct is noted in the fibular head. A small suprapatellar joint effusion is noted. IMPRESSION: 1. Stable bone infarcts with subchondral collapse involving the medial and lateral femoral condyles. A stable osteochondral defect is noted in the lateral femoral condyle and a possible new osteochondral defect is seen in the medial femoral condyle. 2. Small bone infarct in the head of the fibula. 3. Small joint effusion. Electronically Signed   By: Brett Fairy M.D.   On: 10/27/2021 04:22   DG Knee 1-2 Views Left  Result Date: 10/27/2021 CLINICAL DATA:  Sickle cell crisis, AVN from sickle cell versus gout, undiagnosed scarring, or autoimmune arthritis. Joints are swollen and painful. EXAM: LEFT KNEE - 1-2 VIEW COMPARISON:  10/12/2019, 05/04/2021. FINDINGS: No acute fracture or dislocation. Sclerosis is present in the medial tibial plateau with subchondral collapse suggesting osteonecrosis, unchanged from most recent exam. Joint space narrowing is noted in the medial compartment. A stable area of sclerosis is noted in the lateral femoral condyle. A vague areas sclerosis is noted in the medial femoral condyle, unchanged. No joint effusion. The soft tissues are within normal limits. IMPRESSION: 1. Sclerosis in the medial tibial plateau with subchondral collapse suggesting osteonecrosis, not significantly changed from the prior exam. 2. Stable of sclerosis in the medial and lateral femoral condyles, possible bone infarcts. Electronically Signed   By: Brett Fairy M.D.   On: 10/27/2021 04:17   DG Hand 2 View Right  Result Date: 10/27/2021 CLINICAL DATA:  Sickle cell crisis, swollen painful joints, possible AVN from sickle cell crisis versus gout, RA, or autoimmune arthritis. EXAM: RIGHT HAND - 2 VIEW COMPARISON:  10/11/2021.  FINDINGS: No acute fracture or dislocation. There is subchondral sclerosis with cortical collapse involving the second and fifth metacarpal heads, suggesting osteonecrosis. Sclerosis, subchondral cysts, and irregularity of the articular surface of the lunate and scaphoid with articular collapse, unchanged. There is sclerosis with expansion of the second and fifth metacarpals, second through fifth proximal phalanges, and middle phalanx of the third and fourth digits. The soft tissues are within normal limits. IMPRESSION: Stable findings of osteonecrosis at the second and fifth metacarpals, lunate and capitate. Possible bone infarcts involving the second through fourth digits, not significantly changed from the prior exam. Electronically Signed   By: Brett Fairy M.D.   On: 10/27/2021 04:09   DG Hand 2 View Left  Result Date: 10/27/2021 CLINICAL DATA:  Sickle cell crisis; AVN from sickle cell versus gout versus undiagnosed rheumatoid arthritis or other autoimmune arthritis; joints are swollen and painful EXAM: LEFT HAND - 2 VIEW COMPARISON:  None Available. FINDINGS: No evidence of acute fracture or dislocation. Sclerosis of the lunate compatible with avascular necrosis. Subchondral collapse with central sclerosis about the head of the fifth metacarpal also suggestive of AVN. Pulse ox device obscures the  tip of the second finger. No significant soft tissue abnormality. IMPRESSION: No acute fracture or dislocation. 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 Electronically Signed   By: Placido Sou M.D.   On: 10/27/2021 04:02    Medications: Scheduled Meds:  amitriptyline  50 mg Oral QHS   calcitRIOL  0.25 mcg Oral Q breakfast   Deferiprone (Twice Daily)  2,000 mg Oral Once per day on Sun Mon Wed Fri Sat   Deferiprone (Twice Daily)  3,000 mg Oral Once per day on Tue Thu   folic acid  1 mg Oral q morning   heparin  5,000 Units Subcutaneous Q8H   HYDROmorphone    Intravenous Q4H   hydroxyurea  1,000 mg Oral Once per day on Tue Thu   hydroxyurea  500 mg Oral Once per day on Sun Mon Wed Fri Sat   oxyCODONE  15 mg Oral Q12H   patiromer  16.8 g Oral Daily   senna-docusate  1 tablet Oral BID   sodium bicarbonate  1,950 mg Oral TID WC   sodium polystyrene  30 g Oral Once   voxelotor  1,500 mg Oral Q breakfast   Continuous Infusions:  sodium chloride 125 mL/hr at 10/28/21 0747   PRN Meds:.diphenhydrAMINE, naloxone **AND** sodium chloride flush, ondansetron (ZOFRAN) IV, oxyCODONE, polyethylene glycol  Consultants: none  Procedures: none  Antibiotics: none  Assessment/Plan: Principal Problem:   Acute sickle cell crisis (HCC) Active Problems:   CKD (chronic kidney disease), stage IV (HCC)   Hyperkalemia   Sickle cell pain crisis (HCC)   Bilateral hand pain   Hyperkalemia: Today, patient's potassium is elevated at 6.7.  Patient has chronically elevated potassium secondary to chronic kidney disease stage IV.  Will administer Kayexalate 30 g x 1.  Recheck potassium level this afternoon.  Continue telemetry.  Sickle cell disease with pain crisis: Weaning IV Dilaudid PCA Restart patient's home medications.  Oxycodone 10 mg every 6 hours as needed for severe breakthrough pain OxyContin 15 mg every 12 hours Continue IV fluids, 0.45% saline milliliters per hour Monitor vital signs very closely, reevaluate pain scale regularly, and supplemental oxygen as needed  Chronic pain syndrome: Continue home medications  Chronic kidney disease stage IV: Continue home medications.  Gentle hydration.  Avoid all nephrotoxins.  Patient is followed by nephrology team at [facility]  Anemia of chronic disease: Today, patient's hemoglobin is 8.3 g/dL which is consistent with his baseline.  No clinical indication for blood transfusion at this time.  Follow closely.  Labs in AM.  Leukocytosis: Improving.  More than likely secondary to sickle cell crisis.  No signs  of acute infection.  Monitor closely without antibiotics.  Labs in AM.  Code Status: Full Code Family Communication: N/A Disposition Plan: Not yet ready for discharge   [place], MSN, FNP-C [address] [place], [place] [address] [phone]  If 7PM-7AM, please contact night-coverage.  10/28/2021, 11:59 AM  LOS: 1 day

## 2021-10-29 ENCOUNTER — Other Ambulatory Visit: Payer: Self-pay | Admitting: Internal Medicine

## 2021-10-29 DIAGNOSIS — E875 Hyperkalemia: Secondary | ICD-10-CM | POA: Diagnosis not present

## 2021-10-29 DIAGNOSIS — D5700 Hb-SS disease with crisis, unspecified: Secondary | ICD-10-CM | POA: Diagnosis not present

## 2021-10-29 DIAGNOSIS — N184 Chronic kidney disease, stage 4 (severe): Secondary | ICD-10-CM | POA: Diagnosis not present

## 2021-10-29 DIAGNOSIS — M79641 Pain in right hand: Secondary | ICD-10-CM | POA: Diagnosis not present

## 2021-10-29 LAB — BASIC METABOLIC PANEL
Anion gap: 6 (ref 5–15)
BUN: 31 mg/dL — ABNORMAL HIGH (ref 6–20)
CO2: 19 mmol/L — ABNORMAL LOW (ref 22–32)
Calcium: 8.5 mg/dL — ABNORMAL LOW (ref 8.9–10.3)
Chloride: 110 mmol/L (ref 98–111)
Creatinine, Ser: 2.81 mg/dL — ABNORMAL HIGH (ref 0.61–1.24)
GFR, Estimated: 29 mL/min — ABNORMAL LOW (ref >60)
Glucose, Bld: 87 mg/dL (ref 70–99)
Potassium: 4.9 mmol/L (ref 3.5–5.1)
Sodium: 135 mmol/L (ref 135–145)

## 2021-10-29 NOTE — Discharge Summary (Signed)
Physician Discharge Summary  [name] [name]. KGM:[id number] DOB: [birth year] DOA: 10/26/2021  PCP: [name], FNP  Admit date: 10/26/2021  Discharge date: 10/29/2021  Discharge Diagnoses:  Principal Problem:   Acute sickle cell crisis ([place]) Active Problems:   CKD (chronic kidney disease), stage IV (HCC)   Hyperkalemia   Sickle cell pain crisis ([place])   Bilateral hand pain   Discharge Condition: Stable  Disposition:   Follow-up Information     [name], FNP. Schedule an appointment as soon as possible for a visit in 1 week(s).   Specialty: Family Medicine Contact information: [place]. [place] [address] [phone]         Buford Dresser, MD .   Specialty: Cardiology Contact information: [address] [place] [address] [phone]                Pt is discharged home in good condition and is to follow up with [name], FNP this week to have labs evaluated. [name] A Graw Jr. is instructed to increase activity slowly and balance with rest for the next few days, and use prescribed medication to complete treatment of pain  Diet: Regular Wt Readings from Last 3 Encounters:  10/26/21 52.9 kg  10/25/21 56.7 kg  10/13/21 55.2 kg    History of present illness:  [name] Olden. is a 41 y.o. male with medical history significant of HGB SS dz, frequent admits for pain, polysubstance abuse including THC and h/o cocaine abuse, CKD 4.   Pt presents to ED with c/o sickle cell pain crisis.   Patient reports pain to the bilateral hands and knees, worse on the left side, with some swelling to these locations. Worse with movement. No alleviating factors. Taking his home medication without much change. Feels like prior sickle cell pain. No recent traumatic injuries.   Denies fever, chills, chest pain, dyspnea, numbness, weakness, abdominal pain, nausea or vomiting. Received fluids @ the  sickle cell clinic earlier today.   Hospital Course:  Patient was admitted for sickle cell pain crisis and managed appropriately with IVF, IV Dilaudid via PCA as well as other adjunct therapies per sickle cell pain management protocols.  Patient was worked up for autoimmune disease of the joint including rheumatoid factor but all came back negative.  IV Toradol is contraindicated due to CKD.  His CKD is now at stage IV with steady decline in his GFR.  Patient established care with [facility] about 2 weeks ago and has been taking his prescribed medication.  He has stopped using cocaine.  His pain slowly improved with above regimen and he was appropriately weaned off PCA and transitioned successfully to his oral home pain medications.  Patient requested to be discharged today saying his pain is manageable at home.  He remained hemodynamically stable throughout his admission.  He did not receive any blood transfusion during this admission.  Patient was therefore discharged home today in a hemodynamically stable condition.   [name] [name] will follow-up with PCP within 1 week of this discharge. [name] [name] was counseled extensively about nonpharmacologic means of pain management, patient verbalized understanding and was appreciative of  the care received during this admission.   We discussed the need for good hydration, monitoring of hydration status, avoidance of heat, cold, stress, and infection triggers. We discussed the need to be adherent with taking Hydrea and other home medications. Patient was reminded of the need to seek medical attention  immediately if any symptom of bleeding, anemia, or infection occurs.  Discharge Exam: Vitals:   10/29/21 0819 10/29/21 1033  BP:  103/75  Pulse:  90  Resp: 12 16  Temp:    SpO2: 98% 98%   Vitals:   10/29/21 0621 10/29/21 0806 10/29/21 0819 10/29/21 1033  BP: (!) 89/61 (!) 92/55  103/75  Pulse: 84 81  90  Resp: 18 16 12 16   Temp: 98.5 F (36.9 C) (!) 97.4  F (36.3 C)    TempSrc: Oral Oral    SpO2: 100% 97% 98% 98%  Weight:      Height:        General appearance : Awake, alert, not in any distress. Speech Clear. Not toxic looking HEENT: Atraumatic and Normocephalic, pupils equally reactive to light and accomodation Neck: Supple, no JVD. No cervical lymphadenopathy.  Chest: Good air entry bilaterally, no added sounds  CVS: S1 S2 regular, no murmurs.  Abdomen: Bowel sounds present, Non tender and not distended with no gaurding, rigidity or rebound. Extremities: B/L Lower Ext shows no edema, both legs are warm to touch Neurology: Awake alert, and oriented X 3, CN II-XII intact, Non focal Skin: No Rash  Discharge Instructions  Discharge Instructions     Diet - low sodium heart healthy   Complete by: As directed    Increase activity slowly   Complete by: As directed       Allergies as of 10/29/2021       Reactions   Nsaids Other (See Comments)   Kidney disease   Morphine And Related Other (See Comments)   "Real bad headaches"         Medication List     TAKE these medications    amitriptyline 25 MG tablet Commonly known as: ELAVIL Take 50 mg by mouth at bedtime.   calcitRIOL 0.25 MCG capsule Commonly known as: ROCALTROL Take 0.25 mcg by mouth daily with breakfast.   Ferriprox Twice-A-Day 1000 MG Tabs Generic drug: Deferiprone (Twice Daily) Take 2,000-3,000 mg by mouth daily. Takes 2 tablets (2000mg ) once daily every day except takes 3 tablets (3000mg ) on Tuesdays and Thursdays   folic acid 1 MG tablet Commonly known as: FOLVITE TAKE 1 TABLET BY MOUTH EVERY DAY What changed: when to take this   hydroxyurea 500 MG capsule Commonly known as: HYDREA Take 500-1,000 mg by mouth See admin instructions. Take 1000 mg by mouth on Tuesday and Thursday mornings, take 500 mg on all other mornings   Oxbryta 500 MG Tabs tablet Generic drug: voxelotor Take 1,500 mg by mouth daily with breakfast.   Oxycodone HCl 10 MG  Tabs Take 1 tablet by mouth every 6 hours as needed (pain).   OxyCONTIN 15 mg 12 hr tablet Generic drug: oxyCODONE Take 1 tablet (15 mg total) by mouth every 12 (twelve) hours.   sodium bicarbonate 650 MG tablet Take 1,950 mg by mouth 3 (three) times daily with meals.   Veltassa 16.8 g Pack Generic drug: Patiromer Sorbitex Calcium Take 1 packet by mouth daily.        The results of significant diagnostics from this hospitalization (including imaging, microbiology, ancillary and laboratory) are listed below for reference.    Significant Diagnostic Studies: DG Knee 1-2 Views Right  Result Date: 10/27/2021 CLINICAL DATA:  Sickle cell crisis, avian from sickle-cell versus gout, RA, or autoimmune arthritis. Swollen and painful joints. EXAM: RIGHT KNEE - 1-2 VIEW COMPARISON:  04/11/2021. FINDINGS: No acute fracture or dislocation. Extensive bone infarcts  are noted in the medial and lateral femoral condyles with subchondral collapse. An osteochondral defect is noted in the lateral femoral condyle. There is a new lucency in the medial femoral condyle suggesting developing osteochondral defect. A bone infarct is noted in the fibular head. A small suprapatellar joint effusion is noted. IMPRESSION: 1. Stable bone infarcts with subchondral collapse involving the medial and lateral femoral condyles. A stable osteochondral defect is noted in the lateral femoral condyle and a possible new osteochondral defect is seen in the medial femoral condyle. 2. Small bone infarct in the head of the fibula. 3. Small joint effusion. Electronically Signed   By: Brett Fairy M.D.   On: 10/27/2021 04:22   DG Knee 1-2 Views Left  Result Date: 10/27/2021 CLINICAL DATA:  Sickle cell crisis, AVN from sickle cell versus gout, undiagnosed scarring, or autoimmune arthritis. Joints are swollen and painful. EXAM: LEFT KNEE - 1-2 VIEW COMPARISON:  10/12/2019, 05/04/2021. FINDINGS: No acute fracture or dislocation. Sclerosis is  present in the medial tibial plateau with subchondral collapse suggesting osteonecrosis, unchanged from most recent exam. Joint space narrowing is noted in the medial compartment. A stable area of sclerosis is noted in the lateral femoral condyle. A vague areas sclerosis is noted in the medial femoral condyle, unchanged. No joint effusion. The soft tissues are within normal limits. IMPRESSION: 1. Sclerosis in the medial tibial plateau with subchondral collapse suggesting osteonecrosis, not significantly changed from the prior exam. 2. Stable of sclerosis in the medial and lateral femoral condyles, possible bone infarcts. Electronically Signed   By: Brett Fairy M.D.   On: 10/27/2021 04:17   DG Hand 2 View Right  Result Date: 10/27/2021 CLINICAL DATA:  Sickle cell crisis, swollen painful joints, possible AVN from sickle cell crisis versus gout, RA, or autoimmune arthritis. EXAM: RIGHT HAND - 2 VIEW COMPARISON:  10/11/2021. FINDINGS: No acute fracture or dislocation. There is subchondral sclerosis with cortical collapse involving the second and fifth metacarpal heads, suggesting osteonecrosis. Sclerosis, subchondral cysts, and irregularity of the articular surface of the lunate and scaphoid with articular collapse, unchanged. There is sclerosis with expansion of the second and fifth metacarpals, second through fifth proximal phalanges, and middle phalanx of the third and fourth digits. The soft tissues are within normal limits. IMPRESSION: Stable findings of osteonecrosis at the second and fifth metacarpals, lunate and capitate. Possible bone infarcts involving the second through fourth digits, not significantly changed from the prior exam. Electronically Signed   By: Brett Fairy M.D.   On: 10/27/2021 04:09   DG Hand 2 View Left  Result Date: 10/27/2021 CLINICAL DATA:  Sickle cell crisis; AVN from sickle cell versus gout versus undiagnosed rheumatoid arthritis or other autoimmune arthritis; joints are  swollen and painful EXAM: LEFT HAND - 2 VIEW COMPARISON:  None Available. FINDINGS: No evidence of acute fracture or dislocation. Sclerosis of the lunate compatible with avascular necrosis. Subchondral collapse with central sclerosis about the head of the fifth metacarpal also suggestive of AVN. Pulse ox device obscures the tip of the second finger. No significant soft tissue abnormality. IMPRESSION: No acute fracture or dislocation. 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 Electronically Signed   By: Placido Sou M.D.   On: 10/27/2021 04:02   DG Hand Complete Right  Result Date: 10/11/2021 CLINICAL DATA:  pain and swelling EXAM: RIGHT HAND - COMPLETE 3+ VIEW COMPARISON:  None Available. FINDINGS: There is subchondral sclerosis and mild collapse of  the second metacarpal head at the MCP joint, compatible with osteonecrosis. Similar findings of the fifth meta carpal head to a lesser degree. There is sclerosis and articular surface irregularity of the lunate compatible with osteonecrosis. There is bony erosion and lucency along the proximal aspect of the capitate with articular surface collapse. Negative ulnar variance. There is no evidence of acute fracture. Mild sclerosis and medullary expansion of the fifth metacarpal and sclerosis of the second metacarpal and 2nd-4th proximal phalanges. Mild sclerosis in the third and fourth middle phalanges. IMPRESSION: Osseous sequela of sickle disease, including osteonecrosis with articular surface collapse of the second metacarpal head at the MCP joint and to a lesser degree of the fifth metacarpal head at the fifth MCP joint. 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 Probable chronic bone infarcts in the 2nd-4th digits. Electronically Signed   By: Maurine Simmering M.D.   On: 10/11/2021 17:17   DG Chest 2 View  Result Date: 10/11/2021 CLINICAL DATA:  Sickle cell disease related pain. Shortness of  breath, pain to bilateral legs and arms starting yesterday. EXAM: CHEST - 2 VIEW COMPARISON:  Chest x-rays dated 09/21/2021 and 06/23/2021. FINDINGS: Heart size and mediastinal contours are within normal limits. Chronic coarse interstitial markings are seen throughout both lungs, not significantly changed. No confluent opacity to suggest a superimposed pneumonia or overt alveolar pulmonary edema. No pleural effusion or pneumothorax is seen. Lucency at the RIGHT humeral head, compatible with avascular necrosis. Sclerotic changes of the bilateral ribs, likely related to earlier bone infarcts. Several H-shaped vertebrae within the thoracic spine. These findings are compatible with patient's history of sickle cell disease. No acute-appearing osseous abnormality. IMPRESSION: 1. No acute findings. No evidence of pneumonia or overt alveolar pulmonary edema. 2. Evidence of chronic interstitial lung disease of both lungs, likely related to patient's history of sickle cell disease. 3. Associated chronic osseous changes related to patient's history of sickle cell disease, as detailed above. Electronically Signed   By: Franki Cabot M.D.   On: 10/11/2021 10:18    Microbiology: No results found for this or any previous visit (from the past 240 hour(s)).   Labs: Basic Metabolic Panel: Recent Labs  Lab 10/26/21 1148 10/26/21 2320 10/27/21 0359 10/28/21 0736 10/28/21 1642 10/29/21 0640  NA 140 136  --  134* 141 135  K 4.9 5.9*   < > 6.4* 5.8* 4.9  CL 103 104  --  111 112* 110  CO2 27 25  --  16* 20* 19*  GLUCOSE 122* 102*  --  82 99 87  BUN 37* 40*  --  41* 37* 31*  CREATININE 3.82* 3.63*  --  2.97* 3.06* 2.81*  CALCIUM 8.5* 8.6*  --  8.1* 8.9 8.5*   < > = values in this interval not displayed.   Liver Function Tests: Recent Labs  Lab 10/25/21 1125 10/26/21 2320  AST 75* 49*  ALT 61* 51*  ALKPHOS 249* 190*  BILITOT 0.9 0.9  PROT 6.8 6.9  ALBUMIN 4.2 3.3*   No results for input(s): "LIPASE",  "AMYLASE" in the last 168 hours. No results for input(s): "AMMONIA" in the last 168 hours. CBC: Recent Labs  Lab 10/25/21 1125 10/26/21 2320 10/28/21 0911  WBC 13.6* 16.0* 13.8*  NEUTROABS 6.6 8.7*  --   HGB 9.2* 8.2* 8.6*  HCT 25.1* 22.9* 23.5*  MCV 107* 107.5* 107.8*  PLT 194 177 164   Cardiac Enzymes: No results for input(s): "CKTOTAL", "CKMB", "CKMBINDEX", "TROPONINI" in  the last 168 hours. BNP: Invalid input(s): "POCBNP" CBG: No results for input(s): "GLUCAP" in the last 168 hours.  Time coordinating discharge: 50 minutes  Signed:  [facility] 10/29/2021, 11:33 AM

## 2021-10-29 NOTE — Progress Notes (Signed)
Patient was discharged to home, AVS reviewed

## 2021-10-31 ENCOUNTER — Telehealth: Payer: Self-pay

## 2021-10-31 NOTE — Telephone Encounter (Signed)
Oxycontin 15

## 2021-11-01 ENCOUNTER — Other Ambulatory Visit: Payer: Self-pay | Admitting: Family Medicine

## 2021-11-01 ENCOUNTER — Other Ambulatory Visit (HOSPITAL_COMMUNITY): Payer: Self-pay

## 2021-11-01 DIAGNOSIS — G894 Chronic pain syndrome: Secondary | ICD-10-CM

## 2021-11-01 DIAGNOSIS — D571 Sickle-cell disease without crisis: Secondary | ICD-10-CM

## 2021-11-01 LAB — CANNABINOID CONFIRMATION, UR
CANNABINOIDS: POSITIVE — AB
Carboxy THC GC/MS Conf: 314 ng/mL

## 2021-11-01 LAB — DRUG SCREEN 764883 11+OXYCO+ALC+CRT-BUND
Amphetamines, Urine: NEGATIVE ng/mL
BENZODIAZ UR QL: NEGATIVE ng/mL
Barbiturate: NEGATIVE ng/mL
Cocaine (Metabolite): NEGATIVE ng/mL
Creatinine: 80.4 mg/dL (ref 20.0–300.0)
Ethanol: NEGATIVE %
Meperidine: NEGATIVE ng/mL
Methadone Screen, Urine: NEGATIVE ng/mL
Phencyclidine: NEGATIVE ng/mL
Propoxyphene: NEGATIVE ng/mL
Tramadol: NEGATIVE ng/mL
pH, Urine: 7.9 (ref 4.5–8.9)

## 2021-11-01 LAB — OXYCODONE/OXYMORPHONE, CONFIRM
OXYCODONE/OXYMORPH: POSITIVE — AB
OXYCODONE: 681 ng/mL
OXYCODONE: POSITIVE — AB
OXYMORPHONE (GC/MS): >3000 ng/mL
OXYMORPHONE: POSITIVE — AB

## 2021-11-01 LAB — OPIATES CONFIRMATION, URINE: Opiates: NEGATIVE ng/mL

## 2021-11-01 MED ORDER — OXYCODONE HCL 10 MG PO TABS
10.0000 mg | ORAL_TABLET | Freq: Four times a day (QID) | ORAL | 0 refills | Status: DC | PRN
  Filled 2021-11-01: qty 60, 15d supply, fill #0

## 2021-11-01 NOTE — Progress Notes (Signed)
Reviewed PDMP substance reporting system prior to prescribing opiate medications. No inconsistencies noted.  Meds ordered this encounter  Medications   Oxycodone HCl 10 MG TABS    Sig: Take 1 tablet by mouth every 6 hours as needed (pain).    Dispense:  60 tablet    Refill:  0    Order Specific Question:   Supervising Provider    Answer:   [name], [name] [[id number]]   Angelize Ryce Moore Bell Cai  APRN, MSN, FNP-C [facility] [address]  [place], [place] [address] [phone]  

## 2021-11-08 ENCOUNTER — Encounter (HOSPITAL_COMMUNITY): Payer: Medicare Other

## 2021-11-09 ENCOUNTER — Other Ambulatory Visit: Payer: Self-pay | Admitting: Family Medicine

## 2021-11-09 ENCOUNTER — Telehealth: Payer: Self-pay

## 2021-11-09 DIAGNOSIS — G894 Chronic pain syndrome: Secondary | ICD-10-CM

## 2021-11-09 DIAGNOSIS — D571 Sickle-cell disease without crisis: Secondary | ICD-10-CM

## 2021-11-09 MED ORDER — OXYCODONE HCL ER 15 MG PO T12A
15.0000 mg | EXTENDED_RELEASE_TABLET | Freq: Two times a day (BID) | ORAL | 0 refills | Status: DC
  Filled 2021-11-17: qty 60, 30d supply, fill #0

## 2021-11-09 MED ORDER — OXYCODONE HCL 10 MG PO TABS
10.0000 mg | ORAL_TABLET | Freq: Four times a day (QID) | ORAL | 0 refills | Status: DC | PRN
  Filled 2021-11-15: qty 60, 15d supply, fill #0

## 2021-11-09 NOTE — Progress Notes (Signed)
Reviewed PDMP substance reporting system prior to prescribing opiate medications. No inconsistencies noted.    Meds ordered this encounter  Medications   oxyCODONE (OXYCONTIN) 15 mg 12 hr tablet    Sig: Take 1 tablet (15 mg total) by mouth every 12 (twelve) hours.    Dispense:  60 tablet    Refill:  0    Order Specific Question:   Supervising Provider    Answer:   [name] [id number]   Oxycodone HCl 10 MG TABS    Sig: Take 1 tablet by mouth every 6 hours as needed (pain).    Dispense:  60 tablet    Refill:  0    Order Specific Question:   Supervising Provider    Answer:   [name] [[id number]]     [name]  APRN, MSN, FNP-C [facility] [address] [place], [place] [address] [phone]

## 2021-11-09 NOTE — Telephone Encounter (Signed)
Oxycontin

## 2021-11-10 ENCOUNTER — Other Ambulatory Visit (HOSPITAL_COMMUNITY): Payer: Self-pay

## 2021-11-14 ENCOUNTER — Other Ambulatory Visit (HOSPITAL_COMMUNITY): Payer: Self-pay

## 2021-11-15 ENCOUNTER — Non-Acute Institutional Stay (HOSPITAL_COMMUNITY)
Admission: RE | Admit: 2021-11-15 | Discharge: 2021-11-15 | Disposition: A | Discharge status: Home / Self Care | Payer: Medicare Other | Source: Ambulatory Visit | Attending: Internal Medicine | Admitting: Internal Medicine

## 2021-11-15 ENCOUNTER — Other Ambulatory Visit (HOSPITAL_COMMUNITY): Payer: Self-pay

## 2021-11-15 DIAGNOSIS — N184 Chronic kidney disease, stage 4 (severe): Secondary | ICD-10-CM | POA: Insufficient documentation

## 2021-11-15 DIAGNOSIS — D571 Sickle-cell disease without crisis: Secondary | ICD-10-CM | POA: Insufficient documentation

## 2021-11-15 LAB — CBC WITH DIFFERENTIAL/PLATELET
Abs Immature Granulocytes: 0.03 10*3/uL (ref 0.00–0.07)
Basophils Absolute: 0 10*3/uL (ref 0.0–0.1)
Basophils Relative: 0 %
Eosinophils Absolute: 0.1 10*3/uL (ref 0.0–0.5)
Eosinophils Relative: 1 %
HCT: 23.6 % — ABNORMAL LOW (ref 39.0–52.0)
Hemoglobin: 8.2 g/dL — ABNORMAL LOW (ref 13.0–17.0)
Immature Granulocytes: 0 %
Lymphocytes Relative: 49 %
Lymphs Abs: 4.9 10*3/uL — ABNORMAL HIGH (ref 0.7–4.0)
MCH: 39.6 pg — ABNORMAL HIGH (ref 26.0–34.0)
MCHC: 34.7 g/dL (ref 30.0–36.0)
MCV: 114 fL — ABNORMAL HIGH (ref 80.0–100.0)
Monocytes Absolute: 0.4 10*3/uL (ref 0.1–1.0)
Monocytes Relative: 4 %
Neutro Abs: 4.6 10*3/uL (ref 1.7–7.7)
Neutrophils Relative %: 46 %
Platelets: 301 10*3/uL (ref 150–400)
RBC: 2.07 MIL/uL — ABNORMAL LOW (ref 4.22–5.81)
WBC: 10.1 10*3/uL (ref 4.0–10.5)
nRBC: 0 % (ref 0.0–0.2)

## 2021-11-15 LAB — COMPREHENSIVE METABOLIC PANEL
ALT: 34 U/L (ref 0–44)
AST: 42 U/L — ABNORMAL HIGH (ref 15–41)
Albumin: 3.2 g/dL — ABNORMAL LOW (ref 3.5–5.0)
Alkaline Phosphatase: 145 U/L — ABNORMAL HIGH (ref 38–126)
Anion gap: 9 (ref 5–15)
BUN: 47 mg/dL — ABNORMAL HIGH (ref 6–20)
CO2: 21 mmol/L — ABNORMAL LOW (ref 22–32)
Calcium: 8.2 mg/dL — ABNORMAL LOW (ref 8.9–10.3)
Chloride: 106 mmol/L (ref 98–111)
Creatinine, Ser: 3.52 mg/dL — ABNORMAL HIGH (ref 0.61–1.24)
GFR, Estimated: 22 mL/min — ABNORMAL LOW (ref >60)
Glucose, Bld: 80 mg/dL (ref 70–99)
Potassium: 6 mmol/L — ABNORMAL HIGH (ref 3.5–5.1)
Sodium: 136 mmol/L (ref 135–145)
Total Bilirubin: 1.1 mg/dL (ref 0.3–1.2)
Total Protein: 6.8 g/dL (ref 6.5–8.1)

## 2021-11-15 LAB — RETICULOCYTES
Immature Retic Fract: 22.9 % — ABNORMAL HIGH (ref 2.3–15.9)
RBC.: 1.85 MIL/uL — ABNORMAL LOW (ref 4.22–5.81)
Retic Count, Absolute: 83 10*3/uL (ref 19.0–186.0)
Retic Ct Pct: 4.5 % — ABNORMAL HIGH (ref 0.4–3.1)

## 2021-11-15 LAB — LACTATE DEHYDROGENASE: LDH: 158 U/L (ref 98–192)

## 2021-11-15 NOTE — Progress Notes (Signed)
PATIENT CARE CENTER :  Diagnosis :  SCD  Provider : [place] Hollis FNP  Procedure: lab draw ( cbc w diff, cmp, retic, LDH)  Pt seen at infusion center for peripheral lab draw performed by phlebotomist. Labs collected per orders without difficulty. Per [place] FNP, pt does not need to stay for results, will receive a phone call if follow up is needed. Pt made aware, verbalized understanding. Pt alert, oriented and ambulatory at discharge.

## 2021-11-16 ENCOUNTER — Non-Acute Institutional Stay (HOSPITAL_COMMUNITY)
Admission: AD | Admit: 2021-11-16 | Discharge: 2021-11-16 | Disposition: A | Discharge status: Home / Self Care | Payer: Medicare Other | Source: Ambulatory Visit | Attending: Internal Medicine | Admitting: Internal Medicine

## 2021-11-16 ENCOUNTER — Telehealth (HOSPITAL_COMMUNITY): Payer: Self-pay | Admitting: *Deleted

## 2021-11-16 ENCOUNTER — Encounter (HOSPITAL_COMMUNITY): Payer: Self-pay | Admitting: Family Medicine

## 2021-11-16 DIAGNOSIS — N184 Chronic kidney disease, stage 4 (severe): Secondary | ICD-10-CM | POA: Insufficient documentation

## 2021-11-16 DIAGNOSIS — Z87891 Personal history of nicotine dependence: Secondary | ICD-10-CM | POA: Diagnosis not present

## 2021-11-16 DIAGNOSIS — F1120 Opioid dependence, uncomplicated: Secondary | ICD-10-CM | POA: Insufficient documentation

## 2021-11-16 DIAGNOSIS — D5700 Hb-SS disease with crisis, unspecified: Secondary | ICD-10-CM | POA: Diagnosis present

## 2021-11-16 DIAGNOSIS — G894 Chronic pain syndrome: Secondary | ICD-10-CM | POA: Insufficient documentation

## 2021-11-16 LAB — BASIC METABOLIC PANEL
Anion gap: 8 (ref 5–15)
Anion gap: 3 — ABNORMAL LOW (ref 5–15)
BUN: 43 mg/dL — ABNORMAL HIGH (ref 6–20)
BUN: 39 mg/dL — ABNORMAL HIGH (ref 6–20)
CO2: 19 mmol/L — ABNORMAL LOW (ref 22–32)
CO2: 22 mmol/L (ref 22–32)
Calcium: 8.4 mg/dL — ABNORMAL LOW (ref 8.9–10.3)
Calcium: 8.2 mg/dL — ABNORMAL LOW (ref 8.9–10.3)
Chloride: 107 mmol/L (ref 98–111)
Chloride: 107 mmol/L (ref 98–111)
Creatinine, Ser: 3.44 mg/dL — ABNORMAL HIGH (ref 0.61–1.24)
Creatinine, Ser: 3.37 mg/dL — ABNORMAL HIGH (ref 0.61–1.24)
GFR, Estimated: 22 mL/min — ABNORMAL LOW (ref >60)
GFR, Estimated: 23 mL/min — ABNORMAL LOW (ref >60)
Glucose, Bld: 101 mg/dL — ABNORMAL HIGH (ref 70–99)
Glucose, Bld: 108 mg/dL — ABNORMAL HIGH (ref 70–99)
Potassium: 5.1 mmol/L (ref 3.5–5.1)
Potassium: 4.9 mmol/L (ref 3.5–5.1)
Sodium: 134 mmol/L — ABNORMAL LOW (ref 135–145)
Sodium: 132 mmol/L — ABNORMAL LOW (ref 135–145)

## 2021-11-16 MED ORDER — DIPHENHYDRAMINE HCL 25 MG PO CAPS: 25.0000 mg | ORAL_CAPSULE | ORAL | Status: DC | PRN

## 2021-11-16 MED ORDER — POLYETHYLENE GLYCOL 3350 17 G PO PACK: 17.0000 g | PACK | Freq: Every day | ORAL | Status: DC | PRN

## 2021-11-16 MED ORDER — HYDROMORPHONE 1 MG/ML IV SOLN
INTRAVENOUS | Status: DC
  Administered 2021-11-16: 8.5 mg via INTRAVENOUS
  Filled 2021-11-16: qty 30

## 2021-11-16 MED ORDER — ACETAMINOPHEN 500 MG PO TABS
1000.0000 mg | ORAL_TABLET | Freq: Once | ORAL | Status: AC
  Administered 2021-11-16: 1000 mg via ORAL
  Filled 2021-11-16: qty 2

## 2021-11-16 MED ORDER — SENNOSIDES-DOCUSATE SODIUM 8.6-50 MG PO TABS
1.0000 | ORAL_TABLET | Freq: Two times a day (BID) | ORAL | Status: DC
  Filled 2021-11-16: qty 1

## 2021-11-16 MED ORDER — SODIUM CHLORIDE 0.9% FLUSH: 9.0000 mL | INTRAVENOUS | Status: DC | PRN

## 2021-11-16 MED ORDER — SODIUM CHLORIDE 0.45 % IV SOLN: INTRAVENOUS | Status: DC

## 2021-11-16 MED ORDER — NALOXONE HCL 0.4 MG/ML IJ SOLN: 0.4000 mg | INTRAMUSCULAR | Status: DC | PRN

## 2021-11-16 MED ORDER — ONDANSETRON HCL 4 MG/2ML IJ SOLN: 4.0000 mg | Freq: Four times a day (QID) | INTRAMUSCULAR | Status: DC | PRN

## 2021-11-16 NOTE — Discharge Summary (Signed)
[facility] Discharge Summary   Patient ID: [name] [name]. MRN: [id number] DOB/AGE: 1980-11-06 41 y.o.  Admit date: 11/16/2021 Discharge date: 11/16/2021  Primary Care Physician:  [name], FNP  Admission Diagnoses:  Active Problems:   Sickle cell pain crisis [facility]) Discharge Medications:  Allergies as of 11/16/2021       Reactions   Nsaids Other (See Comments)   Kidney disease   Morphine And Related Other (See Comments)   "Real bad headaches"         Medication List     TAKE these medications    amitriptyline 25 MG tablet Commonly known as: ELAVIL Take 50 mg by mouth at bedtime.   calcitRIOL 0.25 MCG capsule Commonly known as: ROCALTROL Take 0.25 mcg by mouth daily with breakfast.   Ferriprox Twice-A-Day 1000 MG Tabs Generic drug: Deferiprone (Twice Daily) Take 2,000-3,000 mg by mouth daily. Takes 2 tablets (2000mg ) once daily every day except takes 3 tablets (3000mg ) on Tuesdays and Thursdays   folic acid 1 MG tablet Commonly known as: FOLVITE TAKE 1 TABLET BY MOUTH EVERY DAY What changed: when to take this   hydroxyurea 500 MG capsule Commonly known as: HYDREA Take 500-1,000 mg by mouth See admin instructions. Take 1000 mg by mouth on Tuesday and Thursday mornings, take 500 mg on all other mornings   Oxbryta 500 MG Tabs tablet Generic drug: voxelotor Take 1,500 mg by mouth daily with breakfast.   Oxycodone HCl 10 MG Tabs Take 1 tablet by mouth every 6 hours as needed (pain).   oxyCODONE 15 mg 12 hr tablet Commonly known as: OXYCONTIN Take 1 tablet (15 mg total) by mouth every 12 (twelve) hours. Start taking on: November 17, 2021   sodium bicarbonate 650 MG tablet Take 1,950 mg by mouth 3 (three) times daily with meals.   Veltassa 16.8 g Pack Generic drug: Patiromer Sorbitex Calcium Take 1 packet by mouth daily.         Consults:  None  Significant Diagnostic Studies:  DG Knee 1-2 Views Right  Result Date:  10/27/2021 CLINICAL DATA:  Sickle cell crisis, avian from sickle-cell versus gout, RA, or autoimmune arthritis. Swollen and painful joints. EXAM: RIGHT KNEE - 1-2 VIEW COMPARISON:  04/11/2021. FINDINGS: No acute fracture or dislocation. Extensive bone infarcts are noted in the medial and lateral femoral condyles with subchondral collapse. An osteochondral defect is noted in the lateral femoral condyle. There is a new lucency in the medial femoral condyle suggesting developing osteochondral defect. A bone infarct is noted in the fibular head. A small suprapatellar joint effusion is noted. IMPRESSION: 1. Stable bone infarcts with subchondral collapse involving the medial and lateral femoral condyles. A stable osteochondral defect is noted in the lateral femoral condyle and a possible new osteochondral defect is seen in the medial femoral condyle. 2. Small bone infarct in the head of the fibula. 3. Small joint effusion. Electronically Signed   By: Brett Fairy M.D.   On: 10/27/2021 04:22   DG Knee 1-2 Views Left  Result Date: 10/27/2021 CLINICAL DATA:  Sickle cell crisis, AVN from sickle cell versus gout, undiagnosed scarring, or autoimmune arthritis. Joints are swollen and painful. EXAM: LEFT KNEE - 1-2 VIEW COMPARISON:  10/12/2019, 05/04/2021. FINDINGS: No acute fracture or dislocation. Sclerosis is present in the medial tibial plateau with subchondral collapse suggesting osteonecrosis, unchanged from most recent exam. Joint space narrowing is noted in the medial compartment. A stable area of sclerosis is noted in  the lateral femoral condyle. A vague areas sclerosis is noted in the medial femoral condyle, unchanged. No joint effusion. The soft tissues are within normal limits. IMPRESSION: 1. Sclerosis in the medial tibial plateau with subchondral collapse suggesting osteonecrosis, not significantly changed from the prior exam. 2. Stable of sclerosis in the medial and lateral femoral condyles, possible bone  infarcts. Electronically Signed   By: Brett Fairy M.D.   On: 10/27/2021 04:17   DG Hand 2 View Right  Result Date: 10/27/2021 CLINICAL DATA:  Sickle cell crisis, swollen painful joints, possible AVN from sickle cell crisis versus gout, RA, or autoimmune arthritis. EXAM: RIGHT HAND - 2 VIEW COMPARISON:  10/11/2021. FINDINGS: No acute fracture or dislocation. There is subchondral sclerosis with cortical collapse involving the second and fifth metacarpal heads, suggesting osteonecrosis. Sclerosis, subchondral cysts, and irregularity of the articular surface of the lunate and scaphoid with articular collapse, unchanged. There is sclerosis with expansion of the second and fifth metacarpals, second through fifth proximal phalanges, and middle phalanx of the third and fourth digits. The soft tissues are within normal limits. IMPRESSION: Stable findings of osteonecrosis at the second and fifth metacarpals, lunate and capitate. Possible bone infarcts involving the second through fourth digits, not significantly changed from the prior exam. Electronically Signed   By: Brett Fairy M.D.   On: 10/27/2021 04:09   DG Hand 2 View Left  Result Date: 10/27/2021 CLINICAL DATA:  Sickle cell crisis; AVN from sickle cell versus gout versus undiagnosed rheumatoid arthritis or other autoimmune arthritis; joints are swollen and painful EXAM: LEFT HAND - 2 VIEW COMPARISON:  None Available. FINDINGS: No evidence of acute fracture or dislocation. Sclerosis of the lunate compatible with avascular necrosis. Subchondral collapse with central sclerosis about the head of the fifth metacarpal also suggestive of AVN. Pulse ox device obscures the tip of the second finger. No significant soft tissue abnormality. IMPRESSION: No acute fracture or dislocation. 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 Electronically Signed   By: Placido Sou M.D.   On: 10/27/2021 04:02     History of Present  Illness: [name] [name] is a 41 year old male with a medical history significant for sickle cell disease, chronic pain syndrome, opiate dependence and tolerance, stage IV chronic kidney disease, patient history of anemia of chronic disease presents with complaints of lower extremity pain that is consistent with previous sickle cell crisis.  Patient states that he was awakened in the middle of the night by intense lower extremity pain.  He characterizes pain as constant and sharp.  He attributes increased pain to leaving his air conditioner on all night.  He rates pain as 9/10.  He last had oxycodone and OxyContin this a.m. without sustained relief.  He denies any fever, chills, chest pain, or shortness of breath.  No urinary symptoms, nausea, vomiting, or diarrhea.  [facility] Course: Admitted patient to sickle cell day infusion clinic for management of pain crisis Reviewed laboratory values, serum creatinine elevated above baseline.  We will recheck prior to discharge and follow-up with patient by phone for laboratory results.  He was advised to follow-up with his nephrology team at [facility]. All other laboratory values were largely consistent with his baseline and did not warrant repeating. Pain managed with IV Dilaudid PCA Tylenol 1000 mg x 1 IV fluids, 0.45% saline at 150 mL/h Patient's pain decreased to 5/10 throughout admission.  He is requesting discharge home.  Patient is alert,  oriented, and ambulating without assistance.  He will discharge home in hemodynamically stable condition.  Discharge Instructions:  Resume all home medications.   Follow up with PCP as previously  scheduled.   Discussed the importance of drinking 64 ounces of water daily, dehydration of red blood cells may lead further sickling.   Avoid all stressors that precipitate sickle cell pain crisis.     The patient was given clear instructions to go to ER or return to medical center if symptoms do not  improve, worsen or new problems develop.   Physical Exam at Discharge:  BP 95/62 (BP Location: Right Arm)   Pulse 85   Temp 97.8 F (36.6 C) (Temporal)   Resp 16   SpO2 99%   Physical Exam Constitutional:      Appearance: Normal appearance.  Eyes:     Pupils: Pupils are equal, round, and reactive to light.  Cardiovascular:     Rate and Rhythm: Normal rate and regular rhythm.     Pulses: Normal pulses.     Heart sounds: Normal heart sounds.  Pulmonary:     Effort: Pulmonary effort is normal.  Abdominal:     General: Bowel sounds are normal.  Musculoskeletal:        General: Normal range of motion.  Skin:    General: Skin is warm.  Neurological:     General: No focal deficit present.     Mental Status: He is alert. Mental status is at baseline.  Psychiatric:        Mood and Affect: Mood normal.        Thought Content: Thought content normal.        Judgment: Judgment normal.      Disposition at Discharge: Discharge disposition: 01-Home or Self Care       Discharge Orders: Discharge Instructions     Discharge patient   Complete by: As directed    Discharge disposition: 01-Home or Self Care   Discharge patient date: 11/16/2021       Condition at Discharge:   Stable  Time spent on Discharge:  Greater than 30 minutes.  Signed: [name]  APRN, MSN, FNP-C [facility] [address] [place], [place] [address] [phone]  11/16/2021, 3:59 PM

## 2021-11-16 NOTE — H&P (Signed)
[facility] History and Physical   Date: 11/16/2021  Patient name: [name] [name]. Medical record number: [id number] Date of birth: [birth year] Age: 41 y.o. Gender: male PCP: [name], FNP  Attending physician: [name], MD  Chief Complaint: Sickle cell pain   History of Present Illness: [name] [name] is a 41 year old male with a medical history significant for sickle cell disease, chronic pain syndrome, opiate dependence and tolerance, stage IV chronic kidney disease, patient history of anemia of chronic disease presents with complaints of lower extremity pain that is consistent with previous sickle cell crisis.  Patient states that he was awakened in the middle of the night by intense lower extremity pain.  He characterizes pain as constant and sharp.  He attributes increased pain to leaving his air conditioner on all night.  He rates pain as 9/10.  He last had oxycodone and OxyContin this a.m. without sustained relief.  He denies any fever, chills, chest pain, or shortness of breath.  No urinary symptoms, nausea, vomiting, or diarrhea.  Meds: Medications Prior to Admission  Medication Sig Dispense Refill Last Dose   amitriptyline (ELAVIL) 25 MG tablet Take 50 mg by mouth at bedtime.      calcitRIOL (ROCALTROL) 0.25 MCG capsule Take 0.25 mcg by mouth daily with breakfast.      Deferiprone, Twice Daily, (FERRIPROX TWICE-A-DAY) 1000 MG TABS Take 2,000-3,000 mg by mouth daily. Takes 2 tablets (2000mg ) once daily every day except takes 3 tablets (3000mg ) on Tuesdays and Thursdays      folic acid (FOLVITE) 1 MG tablet TAKE 1 TABLET BY MOUTH EVERY DAY (Patient taking differently: Take 1 mg by mouth every morning.) 90 tablet 4    hydroxyurea (HYDREA) 500 MG capsule Take 500-1,000 mg by mouth See admin instructions. Take 1000 mg by mouth on Tuesday and Thursday mornings, take 500 mg on all other mornings      [START ON 11/17/2021] oxyCODONE (OXYCONTIN) 15  mg 12 hr tablet Take 1 tablet (15 mg total) by mouth every 12 (twelve) hours. 60 tablet 0    Oxycodone HCl 10 MG TABS Take 1 tablet by mouth every 6 hours as needed (pain). 60 tablet 0    sodium bicarbonate 650 MG tablet Take 1,950 mg by mouth 3 (three) times daily with meals.      VELTASSA 16.8 g PACK Take 1 packet by mouth daily.      voxelotor (OXBRYTA) 500 MG TABS tablet Take 1,500 mg by mouth daily with breakfast.       Allergies: Nsaids and Morphine and related Past Medical History:  Diagnosis Date   Abscess of right iliac fossa 09/24/2018   required Perc Drain 09/24/2018   Arachnoid Cyst of brain bilaterally    "2 really small ones in the back of my head; inside; saw them w/MRI" (09/25/2012)   Bacterial pneumonia ~ 2012   "caught it here in the hospital" (09/25/2012)   Chronic kidney disease    "from my sickle cell" (09/25/2012)   CKD (chronic kidney disease), stage II    Colitis 04/19/2017   CT scan abd/ pelvis   GERD (gastroesophageal reflux disease)    "after I eat alot of spicey foods" (09/25/2012)   Gynecomastia, male 07/10/2012   History of blood transfusion    "always related to sickle cell crisis" (09/25/2012)   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   Noted in noted from Hematology notes at [facility]    "take RX qd  to prevent them" (09/25/2012)   Opioid dependence with withdrawal ([place]) 08/30/2012   Renal insufficiency    Sickle cell anemia (HCC)    Sickle cell crisis ([place]) 09/25/2012   Sickle cell nephropathy ([place]) 07/10/2012   Sinus tachycardia    Tachycardia with heart rate 121-140 beats per minute with ambulation 08/04/2016   Past Surgical History:  Procedure Laterality Date   ABCESS DRAINAGE     CHOLECYSTECTOMY  ~ 2012   COLONOSCOPY N/A 04/23/2017   Procedure: COLONOSCOPY;  Surgeon: Irene Shipper, MD;  Location: [facility] ENDOSCOPY;  Service: Endoscopy;  Laterality: N/A;   IR FLUORO GUIDE CV LINE RIGHT  12/17/2016   IR FLUORO GUIDE CV LINE RIGHT   02/07/2021   IR RADIOLOGIST EVAL & MGMT  10/02/2018   IR RADIOLOGIST EVAL & MGMT  10/15/2018   IR REMOVAL TUN CV CATH W/O FL  12/21/2016   IR REMOVAL TUN CV CATH W/O FL  02/11/2021   IR US GUIDE VASC ACCESS RIGHT  12/17/2016   IR US GUIDE VASC ACCESS RIGHT  02/07/2021   spleenectomy     Family History  Problem Relation Age of Onset   Breast cancer Mother    Social History   Socioeconomic History   Marital status: Single    Spouse name: Not on file   Number of children: Not on file   Years of education: Not on file   Highest education level: Not on file  Occupational History   Not on file  Tobacco Use   Smoking status: Former    Packs/day: 0.10    Types: Cigarettes   Smokeless tobacco: Never  Vaping Use   Vaping Use: Never used  Substance and Sexual Activity   Alcohol use: Yes    Comment: Once a month   Drug use: Yes    Types: Marijuana    Comment: Every 2-3 weeks    Sexual activity: Yes    Birth control/protection: Condom  Other Topics Concern   Not on file  Social History Narrative    Lives alone in [place].   Back at school, studying Public relations account executive. Unemployed.    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    Social Determinants of Health   Financial Resource Strain: Low Risk  (08/23/2020)   Overall Financial Resource Strain (CARDIA)    Difficulty of Paying Living Expenses: Not hard at all  Food Insecurity: No Food Insecurity (08/23/2020)   Hunger Vital Sign    Worried About Running Out of Food in the Last Year: Never true    Ran Out of Food in the Last Year: Never true  Transportation Needs: No Transportation Needs (10/18/2021)   PRAPARE - Hydrologist (Medical): No    Lack of Transportation (Non-Medical): No  Physical Activity: Inactive (08/23/2020)   Exercise Vital Sign    Days of  Exercise per Week: 0 days    Minutes of Exercise per Session: 0 min  Stress: Stress Concern Present (08/23/2020)   [place]    Feeling of Stress : To some extent  Social Connections: Socially Isolated (08/23/2020)   Social Connection and Isolation Panel [NHANES]    Frequency of Communication with Friends and Family: More than three times a week    Frequency of Social Gatherings with Friends and  Family: More than three times a week    Attends Religious Services: Never    Active Member of Clubs or Organizations: No    Attends Archivist Meetings: Never    Marital Status: Divorced  Human resources officer Violence: Not At Risk (08/23/2020)   Humiliation, Afraid, Rape, and Kick questionnaire    Fear of Current or Ex-Partner: No    Emotionally Abused: No    Physically Abused: No    Sexually Abused: No   Review of Systems  Constitutional: Negative.   HENT: Negative.    Eyes: Negative.   Respiratory: Negative.    Cardiovascular: Negative.   Gastrointestinal: Negative.   Genitourinary: Negative.   Musculoskeletal:  Positive for back pain and joint pain.  Skin: Negative.   Neurological: Negative.   Psychiatric/Behavioral: Negative.      Physical Exam: There were no vitals taken for this visit. Physical Exam Constitutional:      Appearance: Normal appearance.  Eyes:     Pupils: Pupils are equal, round, and reactive to light.  Cardiovascular:     Rate and Rhythm: Normal rate and regular rhythm.     Pulses: Normal pulses.     Heart sounds: Murmur heard.  Pulmonary:     Effort: Pulmonary effort is normal.  Abdominal:     General: Bowel sounds are normal.  Neurological:     General: No focal deficit present.     Mental Status: He is alert. Mental status is at baseline.  Psychiatric:        Mood and Affect: Mood normal.        Thought Content: Thought content normal.        Judgment: Judgment normal.       Lab results: Results for orders placed or performed during the hospital encounter of 11/15/21 (from the past 24 hour(s))  CBC with Differential/Platelet     Status: Abnormal   Collection Time: 11/15/21 10:25 AM  Result Value Ref Range   WBC 10.1 4.0 - 10.5 K/uL   RBC 2.07 (L) 4.22 - 5.81 MIL/uL   Hemoglobin 8.2 (L) 13.0 - 17.0 g/dL   HCT 23.6 (L) 39.0 - 52.0 %   MCV 114.0 (H) 80.0 - 100.0 fL   MCH 39.6 (H) 26.0 - 34.0 pg   MCHC 34.7 30.0 - 36.0 g/dL   RDW Not Measured 11.5 - 15.5 %   Platelets 301 150 - 400 K/uL   nRBC 0.0 0.0 - 0.2 %   Neutrophils Relative % 46 %   Neutro Abs 4.6 1.7 - 7.7 K/uL   Lymphocytes Relative 49 %   Lymphs Abs 4.9 (H) 0.7 - 4.0 K/uL   Monocytes Relative 4 %   Monocytes Absolute 0.4 0.1 - 1.0 K/uL   Eosinophils Relative 1 %   Eosinophils Absolute 0.1 0.0 - 0.5 K/uL   Basophils Relative 0 %   Basophils Absolute 0.0 0.0 - 0.1 K/uL   Immature Granulocytes 0 %   Abs Immature Granulocytes 0.03 0.00 - 0.07 K/uL   Dimorphism PRESENT    Polychromasia PRESENT    Target Cells PRESENT   Comprehensive metabolic panel     Status: Abnormal   Collection Time: 11/15/21 10:25 AM  Result Value Ref Range   Sodium 136 135 - 145 mmol/L   Potassium 6.0 (H) 3.5 - 5.1 mmol/L   Chloride 106 98 - 111 mmol/L   CO2 21 (L) 22 - 32 mmol/L   Glucose, Bld 80 70 - 99 mg/dL  BUN 47 (H) 6 - 20 mg/dL   Creatinine, Ser 3.52 (H) 0.61 - 1.24 mg/dL   Calcium 8.2 (L) 8.9 - 10.3 mg/dL   Total Protein 6.8 6.5 - 8.1 g/dL   Albumin 3.2 (L) 3.5 - 5.0 g/dL   AST 42 (H) 15 - 41 U/L   ALT 34 0 - 44 U/L   Alkaline Phosphatase 145 (H) 38 - 126 U/L   Total Bilirubin 1.1 0.3 - 1.2 mg/dL   GFR, Estimated 22 (L) >60 mL/min   Anion gap 9 5 - 15  Lactate dehydrogenase     Status: None   Collection Time: 11/15/21 10:25 AM  Result Value Ref Range   LDH 158 98 - 192 U/L  Reticulocytes     Status: Abnormal   Collection Time: 11/15/21 10:25 AM  Result Value Ref Range   Retic Ct Pct 4.5  (H) 0.4 - 3.1 %   RBC. 1.85 (L) 4.22 - 5.81 MIL/uL   Retic Count, Absolute 83.0 19.0 - 186.0 K/uL   Immature Retic Fract 22.9 (H) 2.3 - 15.9 %    Imaging results:  No results found.   Assessment & Plan: Patient admitted to sickle cell day infusion center for management of pain crisis.  Patient is opiate tolerant Initiate IV dilaudid PCA. IV fluids, 0.45% saline at 150 mL/h Tylenol 1000 mg by mouth times one dose Review CBC with differential, complete metabolic panel, and reticulocytes as results become available. Pain intensity will be reevaluated in context of functioning and relationship to baseline as care progresses If pain intensity remains elevated and/or sudden change in hemodynamic stability transition to inpatient services for higher level of care.   [name]  APRN, MSN, FNP-C [facility] [address] [place], [place] [address] [phone]  11/16/2021, 10:11 AM

## 2021-11-16 NOTE — Progress Notes (Signed)
Patient admitted to the day infusion hospital for sickle cell pain. Initially, patient reported bilateral leg pain rated 10/10. For pain management, patient placed on Dilaudid PCA, given 1000 mg Tylenol and hydrated with IV fluids. At discharge, patient rated pain at 5/10. Vital signs stable. AVS offered but patient refused. Patient alert, oriented and ambulatory at discharge.

## 2021-11-16 NOTE — Telephone Encounter (Signed)
Patient called requesting to come to the day hospital for sickle cell pain. Patient reports bilateral leg pain rated 10/10. Reports taking home pain medication with little relief. Patient is also concerned that his Creatinine level is elevated and believes he needs fluids. [place], [place] notified. Patient can come to the day hospital for pain management and hydration. Patient advised and expresses an understanding.

## 2021-11-17 ENCOUNTER — Other Ambulatory Visit (HOSPITAL_COMMUNITY): Payer: Self-pay

## 2021-11-29 ENCOUNTER — Non-Acute Institutional Stay (HOSPITAL_COMMUNITY)
Admission: RE | Admit: 2021-11-29 | Discharge: 2021-11-29 | Disposition: A | Discharge status: Home / Self Care | Payer: Medicare Other | Source: Ambulatory Visit | Attending: Internal Medicine | Admitting: Internal Medicine

## 2021-11-29 ENCOUNTER — Telehealth: Payer: Self-pay

## 2021-11-29 ENCOUNTER — Other Ambulatory Visit: Payer: Self-pay | Admitting: Family Medicine

## 2021-11-29 DIAGNOSIS — D571 Sickle-cell disease without crisis: Secondary | ICD-10-CM

## 2021-11-29 DIAGNOSIS — D5700 Hb-SS disease with crisis, unspecified: Secondary | ICD-10-CM | POA: Diagnosis present

## 2021-11-29 DIAGNOSIS — G894 Chronic pain syndrome: Secondary | ICD-10-CM

## 2021-11-29 LAB — CBC WITH DIFFERENTIAL/PLATELET
Abs Immature Granulocytes: 0.02 10*3/uL (ref 0.00–0.07)
Basophils Absolute: 0 10*3/uL (ref 0.0–0.1)
Basophils Relative: 0 %
Eosinophils Absolute: 0.1 10*3/uL (ref 0.0–0.5)
Eosinophils Relative: 1 %
HCT: 23.3 % — ABNORMAL LOW (ref 39.0–52.0)
Hemoglobin: 8.4 g/dL — ABNORMAL LOW (ref 13.0–17.0)
Immature Granulocytes: 0 %
Lymphocytes Relative: 63 %
Lymphs Abs: 6.4 10*3/uL — ABNORMAL HIGH (ref 0.7–4.0)
MCH: 41.2 pg — ABNORMAL HIGH (ref 26.0–34.0)
MCHC: 36.1 g/dL — ABNORMAL HIGH (ref 30.0–36.0)
MCV: 114.2 fL — ABNORMAL HIGH (ref 80.0–100.0)
Monocytes Absolute: 0.5 10*3/uL (ref 0.1–1.0)
Monocytes Relative: 5 %
Neutro Abs: 3.3 10*3/uL (ref 1.7–7.7)
Neutrophils Relative %: 31 %
Platelets: 203 10*3/uL (ref 150–400)
RBC: 2.04 MIL/uL — ABNORMAL LOW (ref 4.22–5.81)
WBC: 10.4 10*3/uL (ref 4.0–10.5)
nRBC: 0 % (ref 0.0–0.2)

## 2021-11-29 LAB — RETICULOCYTES
Immature Retic Fract: 14.9 % (ref 2.3–15.9)
RBC.: 2.1 MIL/uL — ABNORMAL LOW (ref 4.22–5.81)
Retic Count, Absolute: 73 K/uL (ref 19.0–186.0)
Retic Ct Pct: 3.5 % — ABNORMAL HIGH (ref 0.4–3.1)

## 2021-11-29 LAB — COMPREHENSIVE METABOLIC PANEL
ALT: 30 U/L (ref 0–44)
AST: 40 U/L (ref 15–41)
Albumin: 3.4 g/dL — ABNORMAL LOW (ref 3.5–5.0)
Alkaline Phosphatase: 139 U/L — ABNORMAL HIGH (ref 38–126)
Anion gap: 9 (ref 5–15)
BUN: 44 mg/dL — ABNORMAL HIGH (ref 6–20)
CO2: 26 mmol/L (ref 22–32)
Calcium: 8.7 mg/dL — ABNORMAL LOW (ref 8.9–10.3)
Chloride: 105 mmol/L (ref 98–111)
Creatinine, Ser: 3.34 mg/dL — ABNORMAL HIGH (ref 0.61–1.24)
GFR, Estimated: 23 mL/min — ABNORMAL LOW (ref >60)
Glucose, Bld: 95 mg/dL (ref 70–99)
Potassium: 4.9 mmol/L (ref 3.5–5.1)
Sodium: 140 mmol/L (ref 135–145)
Total Bilirubin: 1 mg/dL (ref 0.3–1.2)
Total Protein: 7.4 g/dL (ref 6.5–8.1)

## 2021-11-29 LAB — LACTATE DEHYDROGENASE: LDH: 154 U/L (ref 98–192)

## 2021-11-29 MED ORDER — OXYCODONE HCL 10 MG PO TABS
10.0000 mg | ORAL_TABLET | Freq: Four times a day (QID) | ORAL | 0 refills | Status: DC | PRN
  Filled 2021-11-29: qty 60, 15d supply, fill #0

## 2021-11-29 NOTE — Progress Notes (Signed)
PATIENT CARE CENTER    Diagnosis :  Sickle cell anemia with pain [facility]) [D57.00]   Provider : [place] Hollis FNP   Procedure: lab draw ( cbc w diff, cmp, retic, LDH)   Patient seen at infusion center for peripheral lab draw performed by phlebotomist. Labs collected per orders without difficulty. Patient did not want to wait for results. Advised patient that we will follow-up with him by phone concerning lab results. Patient expresses an understanding. Alert, oriented and ambulatory at discharge.

## 2021-11-29 NOTE — Telephone Encounter (Signed)
Oxycontin 10 mg

## 2021-11-29 NOTE — Progress Notes (Signed)
Reviewed PDMP substance reporting system prior to prescribing opiate medications. No inconsistencies noted.  Meds ordered this encounter  Medications   Oxycodone HCl 10 MG TABS    Sig: Take 1 tablet by mouth every 6 hours as needed (pain).    Dispense:  60 tablet    Refill:  0    Order Specific Question:   Supervising Provider    Answer:   [name], [name] [[id number]]   [name] Weinert Moore Bryna Razavi  APRN, MSN, FNP-C [facility] [address]  [place], [place] [address] [phone]  

## 2021-11-30 ENCOUNTER — Other Ambulatory Visit (HOSPITAL_COMMUNITY): Payer: Self-pay

## 2021-12-01 ENCOUNTER — Other Ambulatory Visit (HOSPITAL_COMMUNITY): Payer: Self-pay

## 2021-12-06 ENCOUNTER — Other Ambulatory Visit: Payer: Self-pay | Admitting: Family Medicine

## 2021-12-06 ENCOUNTER — Telehealth: Payer: Self-pay

## 2021-12-06 NOTE — Telephone Encounter (Signed)
Oxy Contin 15

## 2021-12-09 ENCOUNTER — Other Ambulatory Visit (HOSPITAL_COMMUNITY): Payer: Self-pay

## 2021-12-13 ENCOUNTER — Other Ambulatory Visit (HOSPITAL_COMMUNITY): Payer: Self-pay

## 2021-12-13 ENCOUNTER — Non-Acute Institutional Stay (HOSPITAL_COMMUNITY)
Admission: RE | Admit: 2021-12-13 | Discharge: 2021-12-13 | Disposition: A | Discharge status: Home / Self Care | Payer: Medicare Other | Source: Ambulatory Visit | Attending: Internal Medicine | Admitting: Internal Medicine

## 2021-12-13 ENCOUNTER — Other Ambulatory Visit: Payer: Self-pay | Admitting: Family Medicine

## 2021-12-13 DIAGNOSIS — D571 Sickle-cell disease without crisis: Secondary | ICD-10-CM | POA: Diagnosis present

## 2021-12-13 DIAGNOSIS — N184 Chronic kidney disease, stage 4 (severe): Secondary | ICD-10-CM | POA: Diagnosis not present

## 2021-12-13 DIAGNOSIS — G894 Chronic pain syndrome: Secondary | ICD-10-CM

## 2021-12-13 LAB — CBC WITH DIFFERENTIAL/PLATELET
Abs Immature Granulocytes: 0.02 10*3/uL (ref 0.00–0.07)
Basophils Absolute: 0 10*3/uL (ref 0.0–0.1)
Basophils Relative: 0 %
Eosinophils Absolute: 0.2 10*3/uL (ref 0.0–0.5)
Eosinophils Relative: 2 %
HCT: 23.2 % — ABNORMAL LOW (ref 39.0–52.0)
Hemoglobin: 8.1 g/dL — ABNORMAL LOW (ref 13.0–17.0)
Immature Granulocytes: 0 %
Lymphocytes Relative: 47 %
Lymphs Abs: 4.4 10*3/uL — ABNORMAL HIGH (ref 0.7–4.0)
MCH: 43.1 pg — ABNORMAL HIGH (ref 26.0–34.0)
MCHC: 34.9 g/dL (ref 30.0–36.0)
MCV: 123.4 fL — ABNORMAL HIGH (ref 80.0–100.0)
Monocytes Absolute: 0.5 10*3/uL (ref 0.1–1.0)
Monocytes Relative: 5 %
Neutro Abs: 4.3 10*3/uL (ref 1.7–7.7)
Neutrophils Relative %: 46 %
Platelets: 202 10*3/uL (ref 150–400)
RBC: 1.88 MIL/uL — ABNORMAL LOW (ref 4.22–5.81)
WBC: 9.4 10*3/uL (ref 4.0–10.5)
nRBC: 0.3 % — ABNORMAL HIGH (ref 0.0–0.2)

## 2021-12-13 LAB — COMPREHENSIVE METABOLIC PANEL
ALT: 59 U/L — ABNORMAL HIGH (ref 0–44)
AST: 85 U/L — ABNORMAL HIGH (ref 15–41)
Albumin: 3.5 g/dL (ref 3.5–5.0)
Alkaline Phosphatase: 160 U/L — ABNORMAL HIGH (ref 38–126)
Anion gap: 11 (ref 5–15)
BUN: 45 mg/dL — ABNORMAL HIGH (ref 6–20)
CO2: 20 mmol/L — ABNORMAL LOW (ref 22–32)
Calcium: 8.7 mg/dL — ABNORMAL LOW (ref 8.9–10.3)
Chloride: 103 mmol/L (ref 98–111)
Creatinine, Ser: 3.72 mg/dL — ABNORMAL HIGH (ref 0.61–1.24)
GFR, Estimated: 20 mL/min — ABNORMAL LOW (ref >60)
Glucose, Bld: 193 mg/dL — ABNORMAL HIGH (ref 70–99)
Potassium: 4.6 mmol/L (ref 3.5–5.1)
Sodium: 134 mmol/L — ABNORMAL LOW (ref 135–145)
Total Bilirubin: 1.3 mg/dL — ABNORMAL HIGH (ref 0.3–1.2)
Total Protein: 7.3 g/dL (ref 6.5–8.1)

## 2021-12-13 LAB — LACTATE DEHYDROGENASE: LDH: 163 U/L (ref 98–192)

## 2021-12-13 NOTE — Progress Notes (Addendum)
PATIENT CARE CENTER    Diagnosis :  Hb-SS disease without crisis [facility]) [D57.1]    Provider : [place] Hollis FNP   Procedure: lab draw ( cbc w diff, cmp, LDH)   Patient seen at infusion center for peripheral lab draw performed by phlebotomist. Labs collected per orders without difficulty. Patient did not want to wait for results. Advised patient that we will follow-up with him by phone concerning lab results. Patient expresses an understanding. Alert, oriented and ambulatory at discharge.

## 2021-12-14 ENCOUNTER — Telehealth: Payer: Self-pay | Admitting: Family Medicine

## 2021-12-14 ENCOUNTER — Other Ambulatory Visit (HOSPITAL_COMMUNITY): Payer: Self-pay

## 2021-12-14 ENCOUNTER — Other Ambulatory Visit: Payer: Self-pay | Admitting: Family Medicine

## 2021-12-14 DIAGNOSIS — G894 Chronic pain syndrome: Secondary | ICD-10-CM

## 2021-12-14 DIAGNOSIS — D571 Sickle-cell disease without crisis: Secondary | ICD-10-CM

## 2021-12-14 MED ORDER — OXYCODONE HCL 10 MG PO TABS
10.0000 mg | ORAL_TABLET | Freq: Four times a day (QID) | ORAL | 0 refills | Status: DC | PRN
  Filled 2021-12-14: qty 60, 15d supply, fill #0

## 2021-12-14 NOTE — Telephone Encounter (Signed)
Oxycodone 10 mg refill request

## 2021-12-14 NOTE — Progress Notes (Signed)
Reviewed PDMP substance reporting system prior to prescribing opiate medications. No inconsistencies noted.  Meds ordered this encounter  Medications   Oxycodone HCl 10 MG TABS    Sig: Take 1 tablet by mouth every 6 hours as needed (pain).    Dispense:  60 tablet    Refill:  0    Order Specific Question:   Supervising Provider    Answer:   [name], [name] [[id number]]   [name] [name] Moore Jenese Mischke  APRN, MSN, FNP-C [facility] [address]  [place], [place] [address] [phone]  

## 2021-12-19 ENCOUNTER — Other Ambulatory Visit: Payer: Self-pay | Admitting: Family Medicine

## 2021-12-19 ENCOUNTER — Other Ambulatory Visit (HOSPITAL_COMMUNITY): Payer: Self-pay

## 2021-12-19 DIAGNOSIS — G894 Chronic pain syndrome: Secondary | ICD-10-CM

## 2021-12-19 DIAGNOSIS — D571 Sickle-cell disease without crisis: Secondary | ICD-10-CM

## 2021-12-19 NOTE — Telephone Encounter (Signed)
[facility] is requesting to fill pt oxycodone. Please advise KH  

## 2021-12-20 ENCOUNTER — Other Ambulatory Visit: Payer: Self-pay | Admitting: Family Medicine

## 2021-12-20 ENCOUNTER — Other Ambulatory Visit (HOSPITAL_COMMUNITY): Payer: Self-pay

## 2021-12-20 DIAGNOSIS — G894 Chronic pain syndrome: Secondary | ICD-10-CM

## 2021-12-20 DIAGNOSIS — D571 Sickle-cell disease without crisis: Secondary | ICD-10-CM

## 2021-12-20 MED ORDER — OXYCODONE HCL ER 15 MG PO T12A
15.0000 mg | EXTENDED_RELEASE_TABLET | Freq: Two times a day (BID) | ORAL | 0 refills | Status: DC
  Filled 2021-12-20: qty 60, 30d supply, fill #0

## 2021-12-20 NOTE — Progress Notes (Signed)
Reviewed PDMP substance reporting system prior to prescribing opiate medications. No inconsistencies noted.  Meds ordered this encounter  Medications   oxyCODONE (OXYCONTIN) 15 mg 12 hr tablet    Sig: Take 1 tablet (15 mg total) by mouth every 12 (twelve) hours.    Dispense:  60 tablet    Refill:  0    Order Specific Question:   Supervising Provider    Answer:   [name], [name] [[id number]]   Lot Medford Moore Rashan Rounsaville  APRN, MSN, FNP-C [facility] [address]  [place], [place] [address] [phone]  

## 2021-12-26 ENCOUNTER — Encounter (HOSPITAL_COMMUNITY): Payer: Self-pay | Admitting: Family Medicine

## 2021-12-26 ENCOUNTER — Other Ambulatory Visit: Payer: Self-pay

## 2021-12-26 ENCOUNTER — Emergency Department (HOSPITAL_COMMUNITY)
Admission: EM | Admit: 2021-12-26 | Discharge: 2021-12-26 | Disposition: A | Discharge status: Home / Self Care | Payer: Medicare Other | Source: Home / Self Care | Attending: Emergency Medicine | Admitting: Emergency Medicine

## 2021-12-26 ENCOUNTER — Inpatient Hospital Stay (HOSPITAL_COMMUNITY)
Admission: AD | Admit: 2021-12-26 | Discharge: 2021-12-28 | DRG: 812 | Disposition: A | Discharge status: Home / Self Care | Payer: Medicare Other | Source: Ambulatory Visit | Attending: Internal Medicine | Admitting: Internal Medicine

## 2021-12-26 ENCOUNTER — Emergency Department (HOSPITAL_COMMUNITY): Payer: Medicare Other

## 2021-12-26 ENCOUNTER — Encounter (HOSPITAL_COMMUNITY): Payer: Self-pay

## 2021-12-26 DIAGNOSIS — G894 Chronic pain syndrome: Secondary | ICD-10-CM | POA: Diagnosis present

## 2021-12-26 DIAGNOSIS — N184 Chronic kidney disease, stage 4 (severe): Secondary | ICD-10-CM | POA: Diagnosis present

## 2021-12-26 DIAGNOSIS — Z9049 Acquired absence of other specified parts of digestive tract: Secondary | ICD-10-CM | POA: Diagnosis not present

## 2021-12-26 DIAGNOSIS — Z886 Allergy status to analgesic agent status: Secondary | ICD-10-CM | POA: Diagnosis not present

## 2021-12-26 DIAGNOSIS — M25562 Pain in left knee: Secondary | ICD-10-CM | POA: Diagnosis present

## 2021-12-26 DIAGNOSIS — N08 Glomerular disorders in diseases classified elsewhere: Secondary | ICD-10-CM | POA: Diagnosis present

## 2021-12-26 DIAGNOSIS — Z885 Allergy status to narcotic agent status: Secondary | ICD-10-CM

## 2021-12-26 DIAGNOSIS — M25561 Pain in right knee: Secondary | ICD-10-CM | POA: Insufficient documentation

## 2021-12-26 DIAGNOSIS — D5700 Hb-SS disease with crisis, unspecified: Secondary | ICD-10-CM | POA: Insufficient documentation

## 2021-12-26 DIAGNOSIS — K219 Gastro-esophageal reflux disease without esophagitis: Secondary | ICD-10-CM | POA: Diagnosis present

## 2021-12-26 DIAGNOSIS — M25461 Effusion, right knee: Secondary | ICD-10-CM | POA: Insufficient documentation

## 2021-12-26 DIAGNOSIS — Z87891 Personal history of nicotine dependence: Secondary | ICD-10-CM

## 2021-12-26 DIAGNOSIS — D72829 Elevated white blood cell count, unspecified: Secondary | ICD-10-CM | POA: Diagnosis present

## 2021-12-26 DIAGNOSIS — Z79899 Other long term (current) drug therapy: Secondary | ICD-10-CM | POA: Diagnosis not present

## 2021-12-26 DIAGNOSIS — D638 Anemia in other chronic diseases classified elsewhere: Secondary | ICD-10-CM | POA: Diagnosis present

## 2021-12-26 DIAGNOSIS — Z79891 Long term (current) use of opiate analgesic: Secondary | ICD-10-CM

## 2021-12-26 LAB — CBC WITH DIFFERENTIAL/PLATELET
Abs Immature Granulocytes: 0.04 10*3/uL (ref 0.00–0.07)
Basophils Absolute: 0 10*3/uL (ref 0.0–0.1)
Basophils Relative: 0 %
Eosinophils Absolute: 0.2 10*3/uL (ref 0.0–0.5)
Eosinophils Relative: 1 %
HCT: 24.1 % — ABNORMAL LOW (ref 39.0–52.0)
Hemoglobin: 8.8 g/dL — ABNORMAL LOW (ref 13.0–17.0)
Immature Granulocytes: 0 %
Lymphocytes Relative: 60 %
Lymphs Abs: 7.5 10*3/uL — ABNORMAL HIGH (ref 0.7–4.0)
MCH: 44.7 pg — ABNORMAL HIGH (ref 26.0–34.0)
MCHC: 36.5 g/dL — ABNORMAL HIGH (ref 30.0–36.0)
MCV: 122.3 fL — ABNORMAL HIGH (ref 80.0–100.0)
Monocytes Absolute: 0.8 10*3/uL (ref 0.1–1.0)
Monocytes Relative: 6 %
Neutro Abs: 4.1 10*3/uL (ref 1.7–7.7)
Neutrophils Relative %: 33 %
Platelets: 189 10*3/uL (ref 150–400)
RBC: 1.97 MIL/uL — ABNORMAL LOW (ref 4.22–5.81)
RDW: 17.9 % — ABNORMAL HIGH (ref 11.5–15.5)
WBC: 12.6 10*3/uL — ABNORMAL HIGH (ref 4.0–10.5)
nRBC: 0.2 % (ref 0.0–0.2)

## 2021-12-26 LAB — RETICULOCYTES
Immature Retic Fract: 33.7 % — ABNORMAL HIGH (ref 2.3–15.9)
RBC.: 1.97 MIL/uL — ABNORMAL LOW (ref 4.22–5.81)
Retic Count, Absolute: 80.2 10*3/uL (ref 19.0–186.0)
Retic Ct Pct: 4.1 % — ABNORMAL HIGH (ref 0.4–3.1)

## 2021-12-26 LAB — URINALYSIS, ROUTINE W REFLEX MICROSCOPIC
Bilirubin Urine: NEGATIVE
Glucose, UA: NEGATIVE mg/dL
Hgb urine dipstick: NEGATIVE
Ketones, ur: NEGATIVE mg/dL
Leukocytes,Ua: NEGATIVE
Nitrite: NEGATIVE
Protein, ur: NEGATIVE mg/dL
Specific Gravity, Urine: 1.006 (ref 1.005–1.030)
pH: 8 (ref 5.0–8.0)

## 2021-12-26 LAB — COMPREHENSIVE METABOLIC PANEL
ALT: 37 U/L (ref 0–44)
AST: 52 U/L — ABNORMAL HIGH (ref 15–41)
Albumin: 3.4 g/dL — ABNORMAL LOW (ref 3.5–5.0)
Alkaline Phosphatase: 186 U/L — ABNORMAL HIGH (ref 38–126)
Anion gap: 13 (ref 5–15)
BUN: 51 mg/dL — ABNORMAL HIGH (ref 6–20)
CO2: 26 mmol/L (ref 22–32)
Calcium: 8.4 mg/dL — ABNORMAL LOW (ref 8.9–10.3)
Chloride: 97 mmol/L — ABNORMAL LOW (ref 98–111)
Creatinine, Ser: 3.49 mg/dL — ABNORMAL HIGH (ref 0.61–1.24)
GFR, Estimated: 22 mL/min — ABNORMAL LOW (ref >60)
Glucose, Bld: 88 mg/dL (ref 70–99)
Potassium: 4.9 mmol/L (ref 3.5–5.1)
Sodium: 136 mmol/L (ref 135–145)
Total Bilirubin: 1 mg/dL (ref 0.3–1.2)
Total Protein: 7.6 g/dL (ref 6.5–8.1)

## 2021-12-26 LAB — TROPONIN I (HIGH SENSITIVITY): Troponin I (High Sensitivity): 3 ng/L (ref <18)

## 2021-12-26 LAB — LACTATE DEHYDROGENASE: LDH: 257 U/L — ABNORMAL HIGH (ref 98–192)

## 2021-12-26 MED ORDER — POLYETHYLENE GLYCOL 3350 17 G PO PACK: 17.0000 g | PACK | Freq: Every day | ORAL | Status: DC | PRN

## 2021-12-26 MED ORDER — SODIUM CHLORIDE 0.45 % IV SOLN: INTRAVENOUS | Status: DC

## 2021-12-26 MED ORDER — DIPHENHYDRAMINE HCL 25 MG PO CAPS: 25.0000 mg | ORAL_CAPSULE | ORAL | Status: DC | PRN

## 2021-12-26 MED ORDER — CALCITRIOL 0.25 MCG PO CAPS
0.2500 ug | ORAL_CAPSULE | Freq: Every day | ORAL | Status: DC
  Administered 2021-12-27 – 2021-12-28 (×2): 0.25 ug via ORAL
  Filled 2021-12-26 (×2): qty 1

## 2021-12-26 MED ORDER — VOXELOTOR 500 MG PO TABS
1500.0000 mg | ORAL_TABLET | Freq: Every day | ORAL | Status: DC
  Administered 2021-12-27 – 2021-12-28 (×2): 1500 mg via ORAL

## 2021-12-26 MED ORDER — ENOXAPARIN SODIUM 30 MG/0.3ML IJ SOSY
30.0000 mg | PREFILLED_SYRINGE | INTRAMUSCULAR | Status: DC
  Filled 2021-12-26 (×2): qty 0.3

## 2021-12-26 MED ORDER — HYDROMORPHONE 1 MG/ML IV SOLN
INTRAVENOUS | Status: DC
  Administered 2021-12-26: 2 mg via INTRAVENOUS
  Administered 2021-12-26: 8.5 mg via INTRAVENOUS
  Administered 2021-12-27: 5 mg via INTRAVENOUS
  Administered 2021-12-27: 30 mg via INTRAVENOUS
  Filled 2021-12-26 (×2): qty 30

## 2021-12-26 MED ORDER — DEFERIPRONE (TWICE DAILY) 1000 MG PO TABS
2000.0000 mg | ORAL_TABLET | ORAL | Status: DC
  Administered 2021-12-28: 2000 mg via ORAL

## 2021-12-26 MED ORDER — AMITRIPTYLINE HCL 25 MG PO TABS
50.0000 mg | ORAL_TABLET | Freq: Every day | ORAL | Status: DC
  Administered 2021-12-26 – 2021-12-27 (×2): 50 mg via ORAL
  Filled 2021-12-26 (×2): qty 2

## 2021-12-26 MED ORDER — HYDROXYUREA 500 MG PO CAPS
1000.0000 mg | ORAL_CAPSULE | ORAL | Status: DC
  Administered 2021-12-27: 1000 mg via ORAL
  Filled 2021-12-26: qty 2

## 2021-12-26 MED ORDER — SENNOSIDES-DOCUSATE SODIUM 8.6-50 MG PO TABS
1.0000 | ORAL_TABLET | Freq: Two times a day (BID) | ORAL | Status: DC
  Administered 2021-12-27: 1 via ORAL
  Filled 2021-12-26 (×4): qty 1

## 2021-12-26 MED ORDER — HYDROXYUREA 500 MG PO CAPS
500.0000 mg | ORAL_CAPSULE | ORAL | Status: DC
  Administered 2021-12-28: 500 mg via ORAL
  Filled 2021-12-26: qty 1

## 2021-12-26 MED ORDER — DEFERIPRONE (TWICE DAILY) 1000 MG PO TABS
3000.0000 mg | ORAL_TABLET | ORAL | Status: DC
  Administered 2021-12-27: 3000 mg via ORAL

## 2021-12-26 MED ORDER — PATIROMER SORBITEX CALCIUM 8.4 G PO PACK
16.8000 g | PACK | Freq: Every day | ORAL | Status: DC
  Administered 2021-12-27 – 2021-12-28 (×2): 16.8 g via ORAL
  Filled 2021-12-26 (×2): qty 2

## 2021-12-26 MED ORDER — HYDROMORPHONE HCL 2 MG/ML IJ SOLN
2.0000 mg | Freq: Once | INTRAMUSCULAR | Status: DC
  Filled 2021-12-26: qty 1

## 2021-12-26 MED ORDER — HYDROMORPHONE HCL 1 MG/ML IJ SOLN: 0.5000 mg | INTRAMUSCULAR | Status: DC

## 2021-12-26 MED ORDER — SODIUM CHLORIDE 0.9% FLUSH: 9.0000 mL | INTRAVENOUS | Status: DC | PRN

## 2021-12-26 MED ORDER — ONDANSETRON HCL 4 MG/2ML IJ SOLN: 4.0000 mg | Freq: Four times a day (QID) | INTRAMUSCULAR | Status: DC | PRN

## 2021-12-26 MED ORDER — ACETAMINOPHEN 500 MG PO TABS
1000.0000 mg | ORAL_TABLET | Freq: Once | ORAL | Status: AC
  Administered 2021-12-26: 1000 mg via ORAL
  Filled 2021-12-26: qty 2

## 2021-12-26 MED ORDER — NALOXONE HCL 0.4 MG/ML IJ SOLN: 0.4000 mg | INTRAMUSCULAR | Status: DC | PRN

## 2021-12-26 MED ORDER — FOLIC ACID 1 MG PO TABS: 1.0000 mg | ORAL_TABLET | Freq: Every day | ORAL | Status: DC

## 2021-12-26 MED ORDER — METHYLPREDNISOLONE SODIUM SUCC 125 MG IJ SOLR
80.0000 mg | Freq: Two times a day (BID) | INTRAMUSCULAR | Status: AC
  Administered 2021-12-26 – 2021-12-27 (×2): 80 mg via INTRAVENOUS
  Filled 2021-12-26 (×2): qty 2

## 2021-12-26 MED ORDER — FOLIC ACID 1 MG PO TABS
1.0000 mg | ORAL_TABLET | Freq: Every day | ORAL | Status: DC
  Administered 2021-12-27 – 2021-12-28 (×2): 1 mg via ORAL
  Filled 2021-12-26 (×2): qty 1

## 2021-12-26 MED ORDER — SODIUM BICARBONATE 650 MG PO TABS
1950.0000 mg | ORAL_TABLET | Freq: Three times a day (TID) | ORAL | Status: DC
  Administered 2021-12-27 – 2021-12-28 (×4): 1950 mg via ORAL
  Filled 2021-12-26 (×4): qty 3

## 2021-12-26 MED ORDER — OXYCODONE HCL ER 15 MG PO T12A
15.0000 mg | EXTENDED_RELEASE_TABLET | Freq: Two times a day (BID) | ORAL | Status: DC
  Administered 2021-12-26 – 2021-12-28 (×4): 15 mg via ORAL
  Filled 2021-12-26 (×4): qty 1

## 2021-12-26 MED ORDER — OXYCODONE HCL 5 MG PO TABS
10.0000 mg | ORAL_TABLET | Freq: Once | ORAL | Status: AC
  Administered 2021-12-26: 10 mg via ORAL
  Filled 2021-12-26: qty 2

## 2021-12-26 NOTE — ED Provider Notes (Signed)
[name] DEPT Provider Note   CSN: [id number] Arrival date & time: 12/26/21  0710     History Chief Complaint  Patient presents with   Sickle Cell Pain Crisis    [name] [name]. is a 41 y.o. male hx of sickle cell anemia, polysubstance abuse, and prior sickle cell complications who presents to the ED with complaints bilateral knee pain and swelling for the past three days. Reports right worse than left. Denies any fever. Reports that this feels like his sickle cell pain. Reports mild SOB that he reports is typical for him, but denies any chest pain. He has tried his home medications without relief. Denies any fevers or recent illnesses. Denies any injuries, trauma, or falls.    Sickle Cell Pain Crisis Associated symptoms: no fever, no nausea and no vomiting        Home Medications Prior to Admission medications   Medication Sig Start Date End Date Taking? Authorizing Provider  amitriptyline (ELAVIL) 25 MG tablet Take 50 mg by mouth at bedtime. 03/08/21   [provider]  calcitRIOL (ROCALTROL) 0.25 MCG capsule Take 0.25 mcg by mouth daily with breakfast. 11/16/20   [provider]  Deferiprone, Twice Daily, (FERRIPROX TWICE-A-DAY) 1000 MG TABS Take 2,000-3,000 mg by mouth daily. Takes 2 tablets (2000mg ) once daily every day except takes 3 tablets (3000mg ) on Tuesdays and Thursdays    [provider]  folic acid (FOLVITE) 1 MG tablet TAKE 1 TABLET BY MOUTH EVERY DAY Patient taking differently: Take 1 mg by mouth every morning. 03/22/20   [name], FNP  hydroxyurea (HYDREA) 500 MG capsule Take 500-1,000 mg by mouth See admin instructions. Take 1000 mg by mouth on Tuesday and Thursday mornings, take 500 mg on all other mornings 05/07/20   [provider]  oxyCODONE (OXYCONTIN) 15 mg 12 hr tablet Take 1 tablet (15 mg total) by mouth every 12 (twelve) hours. 12/20/21   [name], FNP  Oxycodone HCl 10  MG TABS Take 1 tablet by mouth every 6 hours as needed (pain). 12/14/21   [name], FNP  sodium bicarbonate 650 MG tablet Take 1,950 mg by mouth 3 (three) times daily with meals. 11/07/18   [provider]  VELTASSA 16.8 g PACK Take 1 packet by mouth daily. 10/10/21   [provider]  voxelotor (OXBRYTA) 500 MG TABS tablet Take 1,500 mg by mouth daily with breakfast.    [provider]      Allergies    Nsaids and Morphine and related    Review of Systems   Review of Systems  Constitutional:  Negative for chills and fever.  Gastrointestinal:  Negative for abdominal pain, nausea and vomiting.  Musculoskeletal:  Positive for arthralgias.    Physical Exam Updated Vital Signs BP 118/81 (BP Location: Left Arm)   Pulse (!) 115   Temp 98.4 F (36.9 C) (Oral)   Resp 16   Ht 5\' 3"  (1.6 m)   Wt 54.4 kg   SpO2 100%   BMI 21.26 kg/m  Physical Exam Vitals and nursing note reviewed.  Constitutional:      General: He is not in acute distress.    Appearance: Normal appearance. He is not ill-appearing or toxic-appearing.  Eyes:     General: No scleral icterus. Cardiovascular:     Rate and Rhythm: Normal rate.  Pulmonary:     Effort: Pulmonary effort is normal. No respiratory distress.  Musculoskeletal:  Comments: Slight swelling noted to the right knee greater than the left. No overlying erythema, or overlying skin changes noted. Flexion and extension intact, but with pain. Compartments are soft. Palpable pulses. Patient is ambulatory.   Skin:    General: Skin is dry.     Findings: No rash.  Neurological:     General: No focal deficit present.     Mental Status: He is alert. Mental status is at baseline.  Psychiatric:        Mood and Affect: Mood normal.     ED Results / Procedures / Treatments   Labs (all labs ordered are listed, but only abnormal results are displayed) Labs Reviewed  COMPREHENSIVE METABOLIC PANEL  CBC WITH  DIFFERENTIAL/PLATELET  RETICULOCYTES  TROPONIN I (HIGH SENSITIVITY)  TROPONIN I (HIGH SENSITIVITY)    EKG EKG Interpretation  Date/Time:  Monday December 26 2021 07:34:59 EST Ventricular Rate:  102 PR Interval:  165 QRS Duration: 85 QT Interval:  319 QTC Calculation: 416 R Axis:   51 Text Interpretation: Sinus tachycardia No significant change since last tracing Confirmed by Blanchie Dessert [phone]) on 12/26/2021 9:42:30 AM  Radiology DG Chest 2 View  Result Date: 12/26/2021 CLINICAL DATA:  Sickle cell crisis, chest pain EXAM: CHEST - 2 VIEW COMPARISON:  10/11/2021 FINDINGS: Normal heart size, mediastinal contours, and pulmonary vascularity. Minimal peribronchial thickening and lingular scarring. Lungs otherwise clear. No acute infiltrate, pleural effusion, or pneumothorax. Chronic osseous changes of sickle cell disease with old bone infarct at RIGHT humeral head. IMPRESSION: No acute abnormalities. Electronically Signed   By: Lavonia Dana M.D.   On: 12/26/2021 08:08   DG Knee Complete 4 Views Left  Result Date: 12/26/2021 CLINICAL DATA:  Pain and swelling LEFT knee, sickle cell crisis for 3 days EXAM: LEFT KNEE - COMPLETE 4+ VIEW COMPARISON:  10/27/2021 FINDINGS: Osseous demineralization. Old bone infarct involving the lateral femoral condyle. Old bone infarct medial tibial plateau with collapse of the articular surface, little changed from previous exam when accounting for differences in positioning. No new fracture or dislocation. Small joint effusion. 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 No new abnormalities. Electronically Signed   By: Lavonia Dana M.D.   On: 12/26/2021 08:06   DG Knee Complete 4 Views Right  Result Date: 12/26/2021 CLINICAL DATA:  Pain and swelling for 3 days, sickle cell crisis EXAM: RIGHT KNEE - COMPLETE 4+ VIEW COMPARISON:  10/27/2021 FINDINGS:  Osseous demineralization. Narrowing of knee joint spaces. Old bone infarct fibular head. Old bone infarcts at the distal RIGHT femur with partial collapse and osteochondral fragments both femoral condyles. No new fracture or dislocation. Small associated joint effusion. IMPRESSION: Old bone infarcts at the distal RIGHT femur with partial collapse and osteochondral fragments at both femoral condyles, minimally progressive. Old bone infarct, RIGHT fibular head. No new abnormalities. Electronically Signed   By: Lavonia Dana M.D.   On: 12/26/2021 08:04    Procedures Procedures   Medications Ordered in ED Medications  HYDROmorphone (DILAUDID) injection 0.5 mg (has no administration in time range)  HYDROmorphone (DILAUDID) injection 0.5 mg (has no administration in time range)  oxyCODONE (Oxy IR/ROXICODONE) immediate release tablet 10 mg (10 mg Oral Given 12/26/21 0735)    ED Course/ Medical Decision Making/ A&P  Medical Decision Making Amount and/or Complexity of Data Reviewed Labs: ordered. Radiology: ordered.  Risk Prescription drug management.   41 year old male presents the emergency room for evaluation of sickle cell pain crisis with bilateral knee pain.  Differential diagnosis includes limited to septic arthritis versus sickle cell crisis versus MSK pain versus arthritis.  Vital signs are unremarkable.  No signs of sepsis.  Physical exam as noted above.  We will order labs and x-ray imaging.  I independently reviewed and interpreted the patient's labs.  Troponin resulted at 3.  Imaging shows Old bone infarcts at the distal RIGHT femur with partial collapse and osteochondral fragments at both femoral condyles, minimally progressive. Old bone infarct, RIGHT fibular head. No new abnormalities. ld bone infarcts at medial LEFT tibial plateau with chronic collapse of the articular surface and more subtly at the lateral femoral condyle. No new abnormalities. CXR shows  no acute abnormalities.   Offered joint aspiration to r/o septic arthritis in patient's sickle cell anemia with patient at increased risk. Patient declined, attending assessed at bedside.  Patient is awake, alert, and oriented. Discussed risk that this could be infectious, although less likely, can not rule out without joint tap. Patient refuses. Discussed that the patient could lose his limb/joint if this was infected. Patient replied with "I don't care, you aren't sticking a needle in my knee." Patient has capacity to make decisions.   I spoke to [place] Hollis, NP over at the sickle cell center.  She reports that she will continue the patient's work-up and pain management over in the clinic with the patient is amenable.  Patient is amenable to being transferred over to the sickle cell clinic day clinic.  Will discharge over to [facility] for labs and pain management.   I discussed this case with my attending physician who cosigned this note including patient's presenting symptoms, physical exam, and planned diagnostics and interventions. Attending physician stated agreement with plan or made changes to plan which were implemented.   Attending physician assessed patient at bedside.  Final Clinical Impression(s) / ED Diagnoses Final diagnoses:  Effusion of right knee  Right knee pain, unspecified chronicity  Sickle cell crisis [facility])    Rx / DC Orders ED Discharge Orders          Ordered    AMB referral to orthopedics        12/26/21 1117              Sherrell Puller, PA-C 12/26/21 1508    Elgie Congo, MD 12/26/21 [phone]

## 2021-12-26 NOTE — ED Triage Notes (Signed)
Patient reports sickle cell pain in both legs as well swelling x 2 days.

## 2021-12-26 NOTE — ED Notes (Signed)
Pt advised he will need ultrasound for blood draw

## 2021-12-26 NOTE — Progress Notes (Signed)
Patient admitted to the day infusion hospital from [facility] for sickle cell pain crisis. Patient initially reported bilateral knee pain rated 10/10. Swelling visible in right knee. For pain management, patient placed on Dilaudid PCA, given 1000 mg Tylenol and hydrated with IV fluids. Patient's pain remained elevated at 7/10. Provider assessed patient and placed orders for admission to inpatient unit to continue to manage pain and decrease swelling in patient's knee.  Report called to Lenna Sciara, RN on [facility]. Patient transferred to [facility] in wheelchair with PCA (setting 0.5/10/3 and verified with Marnette Burgess, RN prior to transfer). Vital signs wnl. Patient alert, oriented and stable at transfer.

## 2021-12-26 NOTE — ED Provider Triage Note (Signed)
Emergency Medicine Provider Triage Evaluation Note  [name] [name]. , a 41 y.o. male  was evaluated in triage.  Pt complains of bilateral knee pain and swelling for the past 3 days. The patient reports that he is having a sickle cell crisis. Denies any fevers. Reports some SOB without chest pain. Reports that this is typical with a sickle cell crisis.  Review of Systems  Positive:  Negative:   Physical Exam  BP 118/81 (BP Location: Left Arm)   Pulse (!) 115   Temp 98.4 F (36.9 C) (Oral)   Resp 16   Ht 5\' 3"  (1.6 m)   Wt 54.4 kg   SpO2 100%   BMI 21.26 kg/m  Gen:   Awake, no distress   Resp:  Normal effort  MSK:   Moves extremities without difficulty  Other:  Mild swelling to bilateral knees. Full ROM.  Medical Decision Making  Medically screening exam initiated at 7:21 AM.  Appropriate orders placed.  [name] [name]. was informed that the remainder of the evaluation will be completed by another provider, this initial triage assessment does not replace that evaluation, and the importance of remaining in the ED until their evaluation is complete.  Will order SOB and bilateral knee pain workup   Sherrell Puller, PA-C 12/26/21 2876

## 2021-12-26 NOTE — H&P (Signed)
[facility] History and Physical   Date: 12/26/2021  Patient name: [name] [name]. Medical record number: [id number] Date of birth: [birth year] Age: 41 y.o. Gender: male PCP: [name], FNP  Attending physician: [name], MD  Chief Complaint: Sickle cell pain  History of Present Illness: [name] [name] is a 41 year old male with a medical history significant for sickle cell disease, chronic pain syndrome, opiate dependence and tolerance, history of anemia of chronic disease, history of vascular necrosis, chronic kidney disease stage IV, and history of marijuana dependence presented to sickle cell center with complaints of bilateral lower extremity pain.  Pain is consistent with his previous sickle cell crisis.  Patient was evaluated in the emergency department, agreed with EDP that patient was appropriate to transition to sickle cell center.  Patient arrived in stable condition with swelling to bilateral lower extremities.  He also rates his pain as 10/10, constant, and sharp.  Patient has not identified any provocative factors concerning crisis.  He has been taking his home medications of oxycodone and OxyContin consistently and without sustained relief.  Patient has also been applying heat pack to knees without very much relief.  Patient says that it is difficult to bear full weight and ambulate.  He denies fever, chills, chest pain, or shortness of breath.  No urinary symptoms, nausea, vomiting, or diarrhea.  No sick contacts, recent travel, or known exposure to COVID-19.  Sickle cell day infusion center course: Patient transition from emergency department in stable condition.  Review labs.  CBC notable for WBCs 12.6, hemoglobin 8.8, and platelets 189,000.  Reticulocytes 4.1%.  LDH 257.  Troponin negative.  CMP shows creatinine 3.49, AST 52, and alkaline phosphatase 186.  Urinalysis pending.  Patient's pain persists despite IV Dilaudid PCA, Tylenol, and  IV fluids.  Patient will be admitted for further management of his sickle cell pain crisis.  Meds: Medications Prior to Admission  Medication Sig Dispense Refill Last Dose   amitriptyline (ELAVIL) 25 MG tablet Take 50 mg by mouth at bedtime.   12/26/2021   calcitRIOL (ROCALTROL) 0.25 MCG capsule Take 0.25 mcg by mouth daily with breakfast.   12/26/2021   Deferiprone, Twice Daily, (FERRIPROX TWICE-A-DAY) 1000 MG TABS Take 2,000-3,000 mg by mouth daily. Takes 2 tablets (2000mg ) once daily every day except takes 3 tablets (3000mg ) on Tuesdays and Thursdays   18/84/1660   folic acid (FOLVITE) 1 MG tablet TAKE 1 TABLET BY MOUTH EVERY DAY (Patient taking differently: Take 1 mg by mouth every morning.) 90 tablet 4 12/26/2021   hydroxyurea (HYDREA) 500 MG capsule Take 500-1,000 mg by mouth See admin instructions. Take 1000 mg by mouth on Tuesday and Thursday mornings, take 500 mg on all other mornings   12/26/2021   oxyCODONE (OXYCONTIN) 15 mg 12 hr tablet Take 1 tablet (15 mg total) by mouth every 12 (twelve) hours. 60 tablet 0 12/26/2021   Oxycodone HCl 10 MG TABS Take 1 tablet by mouth every 6 hours as needed (pain). 60 tablet 0 12/26/2021   voxelotor (OXBRYTA) 500 MG TABS tablet Take 1,500 mg by mouth daily with breakfast.   12/26/2021   sodium bicarbonate 650 MG tablet Take 1,950 mg by mouth 3 (three) times daily with meals.      VELTASSA 16.8 g PACK Take 1 packet by mouth daily.       Allergies: Nsaids and Morphine and related Past Medical History:  Diagnosis Date   Abscess of right iliac fossa 09/24/2018  required Perc Drain 09/24/2018   Arachnoid Cyst of brain bilaterally    "2 really small ones in the back of my head; inside; saw them w/MRI" (09/25/2012)   Bacterial pneumonia ~ 2012   "caught it here in the hospital" (09/25/2012)   Chronic kidney disease    "from my sickle cell" (09/25/2012)   CKD (chronic kidney disease), stage II    Colitis 04/19/2017   CT scan abd/ pelvis   GERD  (gastroesophageal reflux disease)    "after I eat alot of spicey foods" (09/25/2012)   Gynecomastia, male 07/10/2012   History of blood transfusion    "always related to sickle cell crisis" (09/25/2012)   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   Noted in noted from Hematology notes at [facility]    "take RX qd to prevent them" (09/25/2012)   Opioid dependence with withdrawal ([facility]) 08/30/2012   Renal insufficiency    Sickle cell anemia (HCC)    Sickle cell crisis ([place]) 09/25/2012   Sickle cell nephropathy ([place]) 07/10/2012   Sinus tachycardia    Tachycardia with heart rate 121-140 beats per minute with ambulation 08/04/2016   Past Surgical History:  Procedure Laterality Date   ABCESS DRAINAGE     CHOLECYSTECTOMY  ~ 2012   COLONOSCOPY N/A 04/23/2017   Procedure: COLONOSCOPY;  Surgeon: Irene Shipper, MD;  Location: [facility] ENDOSCOPY;  Service: Endoscopy;  Laterality: N/A;   IR FLUORO GUIDE CV LINE RIGHT  12/17/2016   IR FLUORO GUIDE CV LINE RIGHT  02/07/2021   IR RADIOLOGIST EVAL & MGMT  10/02/2018   IR RADIOLOGIST EVAL & MGMT  10/15/2018   IR REMOVAL TUN CV CATH W/O FL  12/21/2016   IR REMOVAL TUN CV CATH W/O FL  02/11/2021   IR US GUIDE VASC ACCESS RIGHT  12/17/2016   IR US GUIDE VASC ACCESS RIGHT  02/07/2021   spleenectomy     Family History  Problem Relation Age of Onset   Breast cancer Mother    Social History   Socioeconomic History   Marital status: Single    Spouse name: Not on file   Number of children: Not on file   Years of education: Not on file   Highest education level: Not on file  Occupational History   Not on file  Tobacco Use   Smoking status: Former    Packs/day: 0.10    Types: Cigarettes   Smokeless tobacco: Never  Vaping Use   Vaping Use: Never used  Substance and Sexual Activity   Alcohol use: Yes    Comment: Once a month   Drug use: Yes    Types: Marijuana    Comment: Every 2-3 weeks    Sexual activity: Yes    Birth  control/protection: Condom  Other Topics Concern   Not on file  Social History Narrative    Lives alone in [place].   Back at school, studying Public relations account executive. Unemployed.    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    Social Determinants of Health   Financial Resource Strain: Low Risk  (08/23/2020)   Overall Financial Resource Strain (CARDIA)    Difficulty of Paying Living Expenses: Not hard at all  Food Insecurity: No Food Insecurity (08/23/2020)   Hunger Vital Sign    Worried About Running Out of Food in the Last Year:  Never true    Ran Out of Food in the Last Year: Never true  Transportation Needs: Unmet Transportation Needs (11/29/2021)   PRAPARE - Transportation    Lack of Transportation (Medical): Yes    Lack of Transportation (Non-Medical): Yes  Physical Activity: Inactive (08/23/2020)   Exercise Vital Sign    Days of Exercise per Week: 0 days    Minutes of Exercise per Session: 0 min  Stress: Stress Concern Present (08/23/2020)   [place]    Feeling of Stress : To some extent  Social Connections: Socially Isolated (08/23/2020)   Social Connection and Isolation Panel [NHANES]    Frequency of Communication with Friends and Family: More than three times a week    Frequency of Social Gatherings with Friends and Family: More than three times a week    Attends Religious Services: Never    Marine scientist or Organizations: No    Attends Archivist Meetings: Never    Marital Status: Divorced  Human resources officer Violence: Not At Risk (08/23/2020)   Humiliation, Afraid, Rape, and Kick questionnaire    Fear of Current or Ex-Partner: No    Emotionally Abused: No    Physically Abused: No    Sexually Abused: No   Review of Systems  Constitutional:  Negative.   HENT: Negative.    Eyes: Negative.   Respiratory: Negative.    Cardiovascular: Negative.   Genitourinary: Negative.   Musculoskeletal:  Positive for joint pain.  Skin: Negative.   Neurological: Negative.   Psychiatric/Behavioral: Negative.      Physical Exam: Blood pressure 120/77, pulse 70, temperature 97.6 F (36.4 C), temperature source Temporal, resp. rate 16, SpO2 100 %. Physical Exam Constitutional:      Appearance: Normal appearance.  Eyes:     Pupils: Pupils are equal, round, and reactive to light.  Cardiovascular:     Rate and Rhythm: Normal rate and regular rhythm.  Pulmonary:     Effort: Pulmonary effort is normal.  Abdominal:     General: Abdomen is flat. Bowel sounds are normal.     Palpations: Abdomen is soft.  Neurological:     Mental Status: He is alert.      Lab results: No results found. However, due to the size of the patient record, not all encounters were searched. Please check Results Review for a complete set of results.  Imaging results:  DG Chest 2 View  Result Date: 12/26/2021 CLINICAL DATA:  Sickle cell crisis, chest pain EXAM: CHEST - 2 VIEW COMPARISON:  10/11/2021 FINDINGS: Normal heart size, mediastinal contours, and pulmonary vascularity. Minimal peribronchial thickening and lingular scarring. Lungs otherwise clear. No acute infiltrate, pleural effusion, or pneumothorax. Chronic osseous changes of sickle cell disease with old bone infarct at RIGHT humeral head. IMPRESSION: No acute abnormalities. Electronically Signed   By: Lavonia Dana M.D.   On: 12/26/2021 08:08   DG Knee Complete 4 Views Left  Result Date: 12/26/2021 CLINICAL DATA:  Pain and swelling LEFT knee, sickle cell crisis for 3 days EXAM: LEFT KNEE - COMPLETE 4+ VIEW COMPARISON:  10/27/2021 FINDINGS: Osseous demineralization. Old bone infarct involving the lateral femoral condyle. Old bone infarct medial tibial plateau with collapse of the articular surface, little  changed from previous exam when accounting for differences in positioning. No new fracture or dislocation. Small joint effusion. IMPRESSION: Old bone infarcts at medial LEFT tibial plateau with chronic collapse of the articular surface and more subtly  at the lateral femoral condyle. No new abnormalities. Electronically Signed   By: Lavonia Dana M.D.   On: 12/26/2021 08:06   DG Knee Complete 4 Views Right  Result Date: 12/26/2021 CLINICAL DATA:  Pain and swelling for 3 days, sickle cell crisis EXAM: RIGHT KNEE - COMPLETE 4+ VIEW COMPARISON:  10/27/2021 FINDINGS: Osseous demineralization. Narrowing of knee joint spaces. Old bone infarct fibular head. Old bone infarcts at the distal RIGHT femur with partial collapse and osteochondral fragments both femoral condyles. No new fracture or dislocation. Small associated joint effusion. IMPRESSION: Old bone infarcts at the distal RIGHT femur with partial collapse and osteochondral fragments at both femoral condyles, minimally progressive. Old bone infarct, RIGHT fibular head. No new abnormalities. Electronically Signed   By: Lavonia Dana M.D.   On: 12/26/2021 08:04     Assessment & Plan: Sickle cell disease with pain crisis: Bilateral lower extremity knee pain persists despite treatment.  Admit to MedSurg.  Continue IV Dilaudid PCA with settings of 0.5/10/3/h.  Hold Toradol due to history of CKD 4. OxyContin 15 mg every 12 hours Hold oxycodone, restart as pain intensity improves. We will continue to monitor vital signs very closely, reevaluate pain scale regularly, and supplemental oxygen as needed Patient will be reevaluated for pain in the context of function and relationship to baseline as care progresses  Chronic pain syndrome: Continue home medications.  Leukocytosis: Mild leukocytosis.  WBCs 12.6.  More than likely reactive, secondary to sickle cell disease.  No signs of acute infection.  Continue to monitor closely.  Anemia of chronic  disease: Hemoglobin 8.8 g/dL, which is consistent with patient's baseline.  There is no clinical indication for blood transfusion at this time.  Continue to monitor closely.  Labs in AM.  Chronic kidney disease stage IV: Patient's serum creatinine is 3.49.  Baseline is 3.2-3.3.  Patient is followed by nephrology.  Continue Veltassa and sodium bicarb.  Bilateral knee pain: Patient has significant swelling to bilateral knees right greater than left.  Right knee x-ray shows old bone infarcts in the distal right femur with partial collapse and osteochondral fragments at both femoral condyles, minimally progressive.  Old bone infarct, right fibular head.  No new abnormalities.  On left, essentially the same.  Old bone infarcts of medial left tibial plateau with chronic collapse of articular surface and more subtly at the lateral femoral condyle.  No new abnormalities.  We will continue with pain control.  Also, initiate IV steroid today.  He will follow-up with his orthopedic specialist as an outpatient.  [name]  APRN, MSN, FNP-C [facility] [address] [place], [place] [address] [phone]  12/26/2021, 12:28 PM

## 2021-12-26 NOTE — Discharge Instructions (Addendum)
You were seen here in the ER for evaluation of your sickle cell pain. We are sending you other to the sickle cell clinic for further workup. I have placed the information for orthopedics for you to follow up with if you were to have continued pain. If you have any concerns, new or worsening symptoms, please return to the nearest ER for re-evaluation.

## 2021-12-27 DIAGNOSIS — D5700 Hb-SS disease with crisis, unspecified: Secondary | ICD-10-CM | POA: Diagnosis not present

## 2021-12-27 LAB — CBC
HCT: 19.9 % — ABNORMAL LOW (ref 39.0–52.0)
Hemoglobin: 7.5 g/dL — ABNORMAL LOW (ref 13.0–17.0)
MCH: 44.6 pg — ABNORMAL HIGH (ref 26.0–34.0)
MCHC: 37.7 g/dL — ABNORMAL HIGH (ref 30.0–36.0)
MCV: 118.5 fL — ABNORMAL HIGH (ref 80.0–100.0)
Platelets: 183 10*3/uL (ref 150–400)
RBC: 1.68 MIL/uL — ABNORMAL LOW (ref 4.22–5.81)
RDW: 16.9 % — ABNORMAL HIGH (ref 11.5–15.5)
WBC: 6.2 10*3/uL (ref 4.0–10.5)
nRBC: 0 % (ref 0.0–0.2)

## 2021-12-27 LAB — BASIC METABOLIC PANEL
Anion gap: 9 (ref 5–15)
BUN: 44 mg/dL — ABNORMAL HIGH (ref 6–20)
CO2: 22 mmol/L (ref 22–32)
Calcium: 8.9 mg/dL (ref 8.9–10.3)
Chloride: 101 mmol/L (ref 98–111)
Creatinine, Ser: 3.17 mg/dL — ABNORMAL HIGH (ref 0.61–1.24)
GFR, Estimated: 25 mL/min — ABNORMAL LOW (ref >60)
Glucose, Bld: 146 mg/dL — ABNORMAL HIGH (ref 70–99)
Potassium: 6.4 mmol/L (ref 3.5–5.1)
Sodium: 132 mmol/L — ABNORMAL LOW (ref 135–145)

## 2021-12-27 LAB — PREPARE RBC (CROSSMATCH)

## 2021-12-27 MED ORDER — OXYCODONE HCL 5 MG PO TABS: 10.0000 mg | ORAL_TABLET | ORAL | Status: DC | PRN

## 2021-12-27 MED ORDER — MELATONIN 3 MG PO TABS
3.0000 mg | ORAL_TABLET | Freq: Once | ORAL | Status: DC
  Filled 2021-12-27: qty 1

## 2021-12-27 MED ORDER — DIPHENHYDRAMINE HCL 50 MG/ML IJ SOLN
25.0000 mg | Freq: Once | INTRAMUSCULAR | Status: AC
  Administered 2021-12-27: 25 mg via INTRAVENOUS
  Filled 2021-12-27: qty 1

## 2021-12-27 MED ORDER — SODIUM CHLORIDE 0.9% IV SOLUTION: Freq: Once | INTRAVENOUS | Status: AC

## 2021-12-27 MED ORDER — ACETAMINOPHEN 325 MG PO TABS
650.0000 mg | ORAL_TABLET | Freq: Once | ORAL | Status: AC
  Administered 2021-12-27: 650 mg via ORAL
  Filled 2021-12-27: qty 2

## 2021-12-27 MED ORDER — HYDROMORPHONE 1 MG/ML IV SOLN
INTRAVENOUS | Status: DC
  Administered 2021-12-27: 4.5 mg via INTRAVENOUS
  Administered 2021-12-28: 4 mg via INTRAVENOUS
  Administered 2021-12-28: 2 mg via INTRAVENOUS
  Administered 2021-12-28: 4 mg via INTRAVENOUS
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
## 2021-12-27 NOTE — Plan of Care (Signed)

## 2021-12-27 NOTE — Progress Notes (Addendum)
Subjective: [name] [name] is a 41 year old male with a medical history significant for sickle cell disease, chronic kidney disease stage IV, chronic pain syndrome, opiate dependence and tolerance, and history of anemia of chronic disease was admitted for sickle cell pain crisis. Today, patient's hemoglobin has decreased to 7.5 g/dL, which is below patient's baseline of 8-9 g/dL. He continues to have increased bilateral knee pain and endorses fatigue.   Patient denies any headache, chest pain, urinary symptoms, nausea, vomiting, or diarrhea.  Objective:  Vital signs in last 24 hours:  Vitals:   12/27/21 1139 12/27/21 1200 12/27/21 1521 12/27/21 1652  BP: 111/76  122/84   Pulse: 82  (!) 102   Resp: 18 18 16 16   Temp: 98.3 F (36.8 C)  98.1 F (36.7 C)   TempSrc: Oral  Oral   SpO2: 100% 98% 100% 99%    Intake/Output from previous day:   Intake/Output Summary (Last 24 hours) at 12/27/2021 1826 Last data filed at 12/27/2021 1652 Gross per 24 hour  Intake 2178.24 ml  Output 1800 ml  Net 378.24 ml    Physical Exam: General: Alert, awake, oriented x3, in no acute distress.  HEENT: [place]/AT PEERL, EOMI Neck: Trachea midline,  no masses, no thyromegal,y no JVD, no carotid bruit OROPHARYNX:  Moist, No exudate/ erythema/lesions.  Heart: Regular rate and rhythm, without murmurs, rubs, gallops, PMI non-displaced, no heaves or thrills on palpation.  Lungs: Clear to auscultation, no wheezing or rhonchi noted. No increased vocal fremitus resonant to percussion  Abdomen: Soft, nontender, nondistended, positive bowel sounds, no masses no hepatosplenomegaly noted..  Neuro: No focal neurological deficits noted cranial nerves II through XII grossly intact. DTRs 2+ bilaterally upper and lower extremities. Strength 5 out of 5 in bilateral upper and lower extremities. Musculoskeletal: No warm swelling or erythema around joints, no spinal tenderness noted. Psychiatric: Patient alert and oriented x3,  good insight and cognition, good recent to remote recall. Lymph node survey: No cervical axillary or inguinal lymphadenopathy noted.  Lab Results:  Basic Metabolic Panel:    Component Value Date/Time   NA 132 (L) 12/27/2021 0510   NA 137 10/25/2021 1125   K 6.4 (HH) 12/27/2021 0510   CL 101 12/27/2021 0510   CO2 22 12/27/2021 0510   BUN 44 (H) 12/27/2021 0510   BUN 35 (H) 10/25/2021 1125   CREATININE 3.17 (H) 12/27/2021 0510   CREATININE 1.45 (H) 05/12/2014 1430   GLUCOSE 146 (H) 12/27/2021 0510   CALCIUM 8.9 12/27/2021 0510   CBC:    Component Value Date/Time   WBC 6.2 12/27/2021 0510   HGB 7.5 (L) 12/27/2021 0510   HGB 9.2 (L) 10/25/2021 1125   HCT 19.9 (L) 12/27/2021 0510   HCT 25.1 (L) 10/25/2021 1125   PLT 183 12/27/2021 0510   PLT 194 10/25/2021 1125   MCV 118.5 (H) 12/27/2021 0510   MCV 107 (H) 10/25/2021 1125   NEUTROABS 4.1 12/26/2021 1315   NEUTROABS 6.6 10/25/2021 1125   LYMPHSABS 7.5 (H) 12/26/2021 1315   LYMPHSABS 5.7 (H) 10/25/2021 1125   MONOABS 0.8 12/26/2021 1315   EOSABS 0.2 12/26/2021 1315   EOSABS 0.4 10/25/2021 1125   BASOSABS 0.0 12/26/2021 1315   BASOSABS 0.1 10/25/2021 1125    No results found for this or any previous visit (from the past 240 hour(s)).  Studies/Results: DG Chest 2 View  Result Date: 12/26/2021 CLINICAL DATA:  Sickle cell crisis, chest pain EXAM: CHEST - 2 VIEW COMPARISON:  10/11/2021 FINDINGS: Normal  heart size, mediastinal contours, and pulmonary vascularity. Minimal peribronchial thickening and lingular scarring. Lungs otherwise clear. No acute infiltrate, pleural effusion, or pneumothorax. Chronic osseous changes of sickle cell disease with old bone infarct at RIGHT humeral head. IMPRESSION: No acute abnormalities. Electronically Signed   By: Lavonia Dana M.D.   On: 12/26/2021 08:08   DG Knee Complete 4 Views Left  Result Date: 12/26/2021 CLINICAL DATA:  Pain and swelling LEFT knee, sickle cell crisis for 3 days EXAM:  LEFT KNEE - COMPLETE 4+ VIEW COMPARISON:  10/27/2021 FINDINGS: Osseous demineralization. Old bone infarct involving the lateral femoral condyle. Old bone infarct medial tibial plateau with collapse of the articular surface, little changed from previous exam when accounting for differences in positioning. No new fracture or dislocation. Small joint effusion. 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 No new abnormalities. Electronically Signed   By: Lavonia Dana M.D.   On: 12/26/2021 08:06   DG Knee Complete 4 Views Right  Result Date: 12/26/2021 CLINICAL DATA:  Pain and swelling for 3 days, sickle cell crisis EXAM: RIGHT KNEE - COMPLETE 4+ VIEW COMPARISON:  10/27/2021 FINDINGS: Osseous demineralization. Narrowing of knee joint spaces. Old bone infarct fibular head. Old bone infarcts at the distal RIGHT femur with partial collapse and osteochondral fragments both femoral condyles. No new fracture or dislocation. Small associated joint effusion. IMPRESSION: Old bone infarcts at the distal RIGHT femur with partial collapse and osteochondral fragments at both femoral condyles, minimally progressive. Old bone infarct, RIGHT fibular head. No new abnormalities. Electronically Signed   By: Lavonia Dana M.D.   On: 12/26/2021 08:04    Medications: Scheduled Meds:  amitriptyline  50 mg Oral QHS   calcitRIOL  0.25 mcg Oral Q breakfast   [START ON 12/28/2021] Deferiprone (Twice Daily)  2,000 mg Oral Once per day on Sun Mon Wed Fri Sat   Deferiprone (Twice Daily)  3,000 mg Oral Once per day on Tue Thu   enoxaparin (LOVENOX) injection  30 mg Subcutaneous X21J   folic acid  1 mg Oral Daily   HYDROmorphone   Intravenous Q4H   hydroxyurea  1,000 mg Oral Once per day on Tue Thu   [START ON 12/28/2021] hydroxyurea  500 mg Oral Once per day on Sun Mon Wed Fri Sat   melatonin  3 mg Oral Once   oxyCODONE  15 mg  Oral Q12H   patiromer  16.8 g Oral Daily   senna-docusate  1 tablet Oral BID   sodium bicarbonate  1,950 mg Oral TID WC   voxelotor  1,500 mg Oral Q breakfast   Continuous Infusions:  sodium chloride 10 mL/hr at 12/27/21 0851   PRN Meds:.diphenhydrAMINE, naloxone **AND** sodium chloride flush, ondansetron (ZOFRAN) IV, polyethylene glycol  Consultants: none  Procedures: none  Antibiotics: none  Assessment/Plan: Principal Problem:   Sickle cell pain crisis ([place]) Active Problems:   CKD (chronic kidney disease), stage IV ([facility])   Leukocytosis   Sickle cell crisis ([facility])   Sickle cell disease with pain crisis: Continue IV Dilaudid PCA, settings decreased today Decrease IV fluids to KVO Hold Toradol due to history of CKD 4 Continue OxyContin 15 mg every 12 hours Continue to monitor vital signs closely, reevaluate pain scale regularly, and supplemental oxygen as needed  Chronic pain syndrome: Continue home medications  Leukocytosis: Resolved.  We will continue to monitor closely.  Labs in AM.  Anemia  of chronic disease: Patient's hemoglobin is below baseline.  Transfuse 1 unit PRBCs today.  Labs in AM.  Chronic kidney disease stage IV: Serum creatinine improving.  Consistent with his baseline.  Continue renal diet.  Also, continue home medications.  He is followed by nephrology as an outpatient.  Right knee pain: Swelling has improved in right knee following IV steroids.  We will continue to monitor closely.  Patient will follow-up with his orthopedic specialist upon discharge.  Code Status: Full Code Family Communication: N/A Disposition Plan: Not yet ready for discharge.  Discharge plan for 12/28/2021  [name]  APRN, MSN, FNP-C Patient [facility] [place], [place] [address] [phone]  If 7PM-7AM, please contact night-coverage.  12/27/2021, 6:26 PM  LOS: 1 day

## 2021-12-28 DIAGNOSIS — D5700 Hb-SS disease with crisis, unspecified: Secondary | ICD-10-CM | POA: Diagnosis not present

## 2021-12-28 LAB — TYPE AND SCREEN
ABO/RH(D): O POS
Antibody Screen: NEGATIVE
Donor AG Type: NEGATIVE
Unit division: 0

## 2021-12-28 LAB — CBC
HCT: 25.3 % — ABNORMAL LOW (ref 39.0–52.0)
Hemoglobin: 9.3 g/dL — ABNORMAL LOW (ref 13.0–17.0)
MCH: 41.2 pg — ABNORMAL HIGH (ref 26.0–34.0)
MCHC: 36.8 g/dL — ABNORMAL HIGH (ref 30.0–36.0)
MCV: 111.9 fL — ABNORMAL HIGH (ref 80.0–100.0)
Platelets: 108 10*3/uL — ABNORMAL LOW (ref 150–400)
RBC: 2.26 MIL/uL — ABNORMAL LOW (ref 4.22–5.81)
RDW: 25 % — ABNORMAL HIGH (ref 11.5–15.5)
WBC: 12.7 10*3/uL — ABNORMAL HIGH (ref 4.0–10.5)
nRBC: 0.2 % (ref 0.0–0.2)

## 2021-12-28 LAB — BASIC METABOLIC PANEL
Anion gap: 8 (ref 5–15)
BUN: 47 mg/dL — ABNORMAL HIGH (ref 6–20)
CO2: 18 mmol/L — ABNORMAL LOW (ref 22–32)
Calcium: 8.8 mg/dL — ABNORMAL LOW (ref 8.9–10.3)
Chloride: 109 mmol/L (ref 98–111)
Creatinine, Ser: 3.52 mg/dL — ABNORMAL HIGH (ref 0.61–1.24)
GFR, Estimated: 22 mL/min — ABNORMAL LOW (ref >60)
Glucose, Bld: 104 mg/dL — ABNORMAL HIGH (ref 70–99)
Potassium: 5.3 mmol/L — ABNORMAL HIGH (ref 3.5–5.1)
Sodium: 135 mmol/L (ref 135–145)

## 2021-12-28 LAB — BPAM RBC
Blood Product Expiration Date: 202312142359
ISSUE DATE / TIME: 202311141057
Unit Type and Rh: 5100

## 2021-12-28 NOTE — Discharge Summary (Signed)
Physician Discharge Summary  [name] [name]. LDJ:[id number] DOB: August 29, 1980 DOA: 12/26/2021  PCP: [name], FNP  Admit date: 12/26/2021  Discharge date: 12/28/2021  Discharge Diagnoses:  Principal Problem:   Sickle cell pain crisis ([place]) Active Problems:   CKD (chronic kidney disease), stage IV (HCC)   Leukocytosis   Sickle cell crisis [name] [name])   Discharge Condition: Stable  Disposition:   Follow-up Information     [name], FNP Follow up.   Specialty: Family Medicine Contact information: [place]. [place] [address] [phone]                Pt is discharged home in good condition and is to follow up with [name], FNP this week to have labs evaluated. [name] A Zaborowski Jr. is instructed to increase activity slowly and balance with rest for the next few days, and use prescribed medication to complete treatment of pain  Diet: Regular Wt Readings from Last 3 Encounters:  12/26/21 54.4 kg  10/26/21 52.9 kg  10/25/21 56.7 kg     History of Present Illness: [name] [name] is a 41 year old male with a medical history significant for sickle cell disease, chronic pain syndrome, opiate dependence and tolerance, history of anemia of chronic disease, history of vascular necrosis, chronic kidney disease stage IV, and history of marijuana dependence presented to sickle cell center with complaints of bilateral lower extremity pain.  Pain is consistent with his previous sickle cell crisis.  Patient was evaluated in the emergency department, agreed with EDP that patient was appropriate to transition to sickle cell center.  Patient arrived in stable condition with swelling to bilateral lower extremities.  He also rates his pain as 10/10, constant, and sharp.  Patient has not identified any provocative factors concerning crisis.  He has been taking his home medications of oxycodone and OxyContin consistently and without sustained  relief.  Patient has also been applying heat pack to knees without very much relief.  Patient says that it is difficult to bear full weight and ambulate.  He denies fever, chills, chest pain, or shortness of breath.  No urinary symptoms, nausea, vomiting, or diarrhea.  No sick contacts, recent travel, or known exposure to COVID-19.   Sickle cell day infusion center course: Patient transition from emergency department in stable condition.  Review labs.  CBC notable for WBCs 12.6, hemoglobin 8.8, and platelets 189,000.  Reticulocytes 4.1%.  LDH 257.  Troponin negative.  CMP shows creatinine 3.49, AST 52, and alkaline phosphatase 186.  Urinalysis pending.  Patient's pain persists despite IV Dilaudid PCA, Tylenol, and IV fluids.  Patient will be admitted for further management of his sickle cell pain crisis.  [name] Course:  Sickle cell pain crisis:  Patient was admitted for sickle cell pain crisis and managed appropriately with IVF, IV Dilaudid via PCA and IV Toradol, as well as other adjunct therapies per sickle cell pain management protocols. Patient says that pain intensity is 4-5 and is requesting discharge home.  His bilateral knee pain has subsided. Edema has improved in right knee. Patient to make first available follow up appt with orthopedic team at [facility].   Anemia of chronic disease:  Patient's hemoglobin was consistent with his baseline.  On admission, he endorsed fatigue and some shortness of breath with exertion.  Patient was transfused 1 unit of PRBCs without complication. Prior to discharge, hemoglobin 9.3 g/dL.  Patient will follow-up with his hematology team at [facility] for further management  of his sickle cell disease.  Patient will continue Oxbryta, hydroxyurea, and folic acid.  Chronic kidney disease stage IV: Patient's baseline is 3.4-3.6.  He is followed by [name] neph[facility].  He will continue his current medication regimen without any changes.  Patient was therefore discharged home  today in a hemodynamically stable condition.   [name] [name] will follow-up with PCP within 1 week of this discharge. [name] was counseled extensively about nonpharmacologic means of pain management, patient verbalized understanding and was appreciative of  the care received during this admission.   We discussed the need for good hydration, monitoring of hydration status, avoidance of heat, cold, stress, and infection triggers. We discussed the need to be adherent with taking Hydrea and other home medications. Patient was reminded of the need to seek medical attention immediately if any symptom of bleeding, anemia, or infection occurs.  Discharge Exam: Vitals:   12/28/21 0706 12/28/21 0900  BP: 93/63   Pulse: 97   Resp: 20 14  Temp: (!) 97.5 F (36.4 C)   SpO2: 100% 94%   Vitals:   12/28/21 0300 12/28/21 0427 12/28/21 0706 12/28/21 0900  BP: 100/74  93/63   Pulse: 82  97   Resp: 18 14 20 14   Temp: 98 F (36.7 C)  (!) 97.5 F (36.4 C)   TempSrc: Oral  Oral   SpO2: 94% 97% 100% 94%    General appearance : Awake, alert, not in any distress. Speech Clear. Not toxic looking HEENT: Atraumatic and Normocephalic, pupils equally reactive to light and accomodation Neck: Supple, no JVD. No cervical lymphadenopathy.  Chest: Good air entry bilaterally, no added sounds  CVS: S1 S2 regular, no murmurs.  Abdomen: Bowel sounds present, Non tender and not distended with no gaurding, rigidity or rebound. Extremities: B/L Lower Ext shows no edema, both legs are warm to touch Neurology: Awake alert, and oriented X 3, CN II-XII intact, Non focal Skin: No Rash  Discharge Instructions  Discharge Instructions     Discharge patient   Complete by: As directed    Discharge disposition: 01-Home or Self Care   Discharge patient date: 12/28/2021      Allergies as of 12/28/2021       Reactions   Nsaids Other (See Comments)   Kidney disease   Morphine And Related Other (See Comments)   "Real bad  headaches"         Medication List     TAKE these medications    amitriptyline 25 MG tablet Commonly known as: ELAVIL Take 50 mg by mouth at bedtime.   calcitRIOL 0.25 MCG capsule Commonly known as: ROCALTROL Take 0.25 mcg by mouth daily with breakfast.   Ferriprox Twice-A-Day 1000 MG Tabs Generic drug: Deferiprone (Twice Daily) Take 3,000 mg by mouth daily.   folic acid 1 MG tablet Commonly known as: FOLVITE TAKE 1 TABLET BY MOUTH EVERY DAY What changed: when to take this   hydroxyurea 500 MG capsule Commonly known as: HYDREA Take 500-1,000 mg by mouth See admin instructions. Take 500 mg by mouth in the morning on Sun/Tues/Wed/Thurs/Sat and 1,000 mg on Mon/Fri   Oxbryta 500 MG Tabs tablet Generic drug: voxelotor Take 1,500 mg by mouth daily with breakfast.   Oxycodone HCl 10 MG Tabs Take 1 tablet by mouth every 6 hours as needed (pain).   OxyCONTIN 15 mg 12 hr tablet Generic drug: oxyCODONE Take 1 tablet (15 mg total) by mouth every 12 (twelve) hours.   sodium bicarbonate 650 MG tablet Take  1,950 mg by mouth with breakfast, with lunch, and with evening meal.   Tylenol 8 Hour Arthritis Pain 650 MG CR tablet Generic drug: acetaminophen Take 650 mg by mouth every 8 (eight) hours as needed for pain.   Veltassa 16.8 g Pack Generic drug: Patiromer Sorbitex Calcium Take 16.8 g by mouth daily.        The results of significant diagnostics from this hospitalization (including imaging, microbiology, ancillary and laboratory) are listed below for reference.    Significant Diagnostic Studies: DG Chest 2 View  Result Date: 12/26/2021 CLINICAL DATA:  Sickle cell crisis, chest pain EXAM: CHEST - 2 VIEW COMPARISON:  10/11/2021 FINDINGS: Normal heart size, mediastinal contours, and pulmonary vascularity. Minimal peribronchial thickening and lingular scarring. Lungs otherwise clear. No acute infiltrate, pleural effusion, or pneumothorax. Chronic osseous changes of sickle  cell disease with old bone infarct at RIGHT humeral head. IMPRESSION: No acute abnormalities. Electronically Signed   By: Lavonia Dana M.D.   On: 12/26/2021 08:08   DG Knee Complete 4 Views Left  Result Date: 12/26/2021 CLINICAL DATA:  Pain and swelling LEFT knee, sickle cell crisis for 3 days EXAM: LEFT KNEE - COMPLETE 4+ VIEW COMPARISON:  10/27/2021 FINDINGS: Osseous demineralization. Old bone infarct involving the lateral femoral condyle. Old bone infarct medial tibial plateau with collapse of the articular surface, little changed from previous exam when accounting for differences in positioning. No new fracture or dislocation. Small joint effusion. 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 No new abnormalities. Electronically Signed   By: Lavonia Dana M.D.   On: 12/26/2021 08:06   DG Knee Complete 4 Views Right  Result Date: 12/26/2021 CLINICAL DATA:  Pain and swelling for 3 days, sickle cell crisis EXAM: RIGHT KNEE - COMPLETE 4+ VIEW COMPARISON:  10/27/2021 FINDINGS: Osseous demineralization. Narrowing of knee joint spaces. Old bone infarct fibular head. Old bone infarcts at the distal RIGHT femur with partial collapse and osteochondral fragments both femoral condyles. No new fracture or dislocation. Small associated joint effusion. IMPRESSION: Old bone infarcts at the distal RIGHT femur with partial collapse and osteochondral fragments at both femoral condyles, minimally progressive. Old bone infarct, RIGHT fibular head. No new abnormalities. Electronically Signed   By: Lavonia Dana M.D.   On: 12/26/2021 08:04    Microbiology: No results found for this or any previous visit (from the past 240 hour(s)).   Labs: Basic Metabolic Panel: Recent Labs  Lab 12/26/21 1315 12/27/21 0510 12/28/21 0508  NA 136 132* 135  K 4.9 6.4* 5.3*  CL 97* 101 109  CO2 26 22 18*  GLUCOSE 88 146* 104*   BUN 51* 44* 47*  CREATININE 3.49* 3.17* 3.52*  CALCIUM 8.4* 8.9 8.8*   Liver Function Tests: Recent Labs  Lab 12/26/21 1315  AST 52*  ALT 37  ALKPHOS 186*  BILITOT 1.0  PROT 7.6  ALBUMIN 3.4*   No results for input(s): "LIPASE", "AMYLASE" in the last 168 hours. No results for input(s): "AMMONIA" in the last 168 hours. CBC: Recent Labs  Lab 12/26/21 1315 12/27/21 0510 12/28/21 0603  WBC 12.6* 6.2 12.7*  NEUTROABS 4.1  --   --   HGB 8.8* 7.5* 9.3*  HCT 24.1* 19.9* 25.3*  MCV 122.3* 118.5* 111.9*  PLT 189 183 108*   Cardiac Enzymes: No results for input(s): "CKTOTAL", "CKMB", "CKMBINDEX", "TROPONINI" in the last 168 hours. BNP: Invalid input(s): "POCBNP"  CBG: No results for input(s): "GLUCAP" in the last 168 hours.  Time coordinating discharge: 30 minutes  Signed:  [name]  APRN, MSN, FNP-C [facility] [address] [place], [place] [address] [phone]  [facility] 12/28/2021, 10:55 AM

## 2022-01-03 ENCOUNTER — Encounter (HOSPITAL_COMMUNITY): Payer: Medicare Other

## 2022-01-06 ENCOUNTER — Other Ambulatory Visit (HOSPITAL_COMMUNITY): Payer: Self-pay

## 2022-01-06 ENCOUNTER — Other Ambulatory Visit: Payer: Self-pay | Admitting: Family Medicine

## 2022-01-06 DIAGNOSIS — G894 Chronic pain syndrome: Secondary | ICD-10-CM

## 2022-01-06 DIAGNOSIS — D571 Sickle-cell disease without crisis: Secondary | ICD-10-CM

## 2022-01-09 ENCOUNTER — Other Ambulatory Visit (HOSPITAL_COMMUNITY): Payer: Self-pay

## 2022-01-09 ENCOUNTER — Other Ambulatory Visit: Payer: Self-pay | Admitting: Family Medicine

## 2022-01-09 DIAGNOSIS — G894 Chronic pain syndrome: Secondary | ICD-10-CM

## 2022-01-09 DIAGNOSIS — D571 Sickle-cell disease without crisis: Secondary | ICD-10-CM

## 2022-01-09 MED ORDER — OXYCODONE HCL 10 MG PO TABS
10.0000 mg | ORAL_TABLET | Freq: Four times a day (QID) | ORAL | 0 refills | Status: DC | PRN
  Filled 2022-01-09: qty 60, 15d supply, fill #0

## 2022-01-09 MED ORDER — OXYCODONE HCL ER 15 MG PO T12A
15.0000 mg | EXTENDED_RELEASE_TABLET | Freq: Two times a day (BID) | ORAL | 0 refills | Status: DC
  Filled 2022-01-19: qty 60, 30d supply, fill #0

## 2022-01-09 NOTE — Progress Notes (Signed)
Reviewed PDMP substance reporting system prior to prescribing opiate medications. No inconsistencies noted.  Meds ordered this encounter  Medications   Oxycodone HCl 10 MG TABS    Sig: Take 1 tablet by mouth every 6 hours as needed (pain).    Dispense:  60 tablet    Refill:  0    Order Specific Question:   Supervising Provider    Answer:   [name] [[id number]]   oxyCODONE (OXYCONTIN) 15 mg 12 hr tablet    Sig: Take 1 tablet (15 mg total) by mouth every 12 (twelve) hours.    Dispense:  60 tablet    Refill:  0    Order Specific Question:   Supervising Provider    Answer:   [name] [[id number]]     [name] Pounds  APRN, MSN, FNP-C [facility] [address] [place], [place] [address] [phone]

## 2022-01-09 NOTE — Telephone Encounter (Signed)
[name] [name] long is requesting to fill pt oxycodone. Please advise

## 2022-01-10 ENCOUNTER — Non-Acute Institutional Stay (HOSPITAL_COMMUNITY)
Admission: RE | Admit: 2022-01-10 | Discharge: 2022-01-10 | Disposition: A | Discharge status: Home / Self Care | Payer: Medicare Other | Source: Ambulatory Visit | Attending: Internal Medicine | Admitting: Internal Medicine

## 2022-01-10 ENCOUNTER — Other Ambulatory Visit: Payer: Self-pay | Admitting: Family Medicine

## 2022-01-10 VITALS — BP 107/70 | HR 85 | Temp 98.0°F | Resp 14

## 2022-01-10 DIAGNOSIS — D5700 Hb-SS disease with crisis, unspecified: Secondary | ICD-10-CM | POA: Diagnosis present

## 2022-01-10 DIAGNOSIS — Z87891 Personal history of nicotine dependence: Secondary | ICD-10-CM | POA: Insufficient documentation

## 2022-01-10 DIAGNOSIS — F1120 Opioid dependence, uncomplicated: Secondary | ICD-10-CM | POA: Insufficient documentation

## 2022-01-10 DIAGNOSIS — G894 Chronic pain syndrome: Secondary | ICD-10-CM | POA: Diagnosis not present

## 2022-01-10 DIAGNOSIS — D571 Sickle-cell disease without crisis: Secondary | ICD-10-CM

## 2022-01-10 DIAGNOSIS — N184 Chronic kidney disease, stage 4 (severe): Secondary | ICD-10-CM | POA: Diagnosis not present

## 2022-01-10 DIAGNOSIS — K219 Gastro-esophageal reflux disease without esophagitis: Secondary | ICD-10-CM | POA: Diagnosis not present

## 2022-01-10 LAB — RETICULOCYTES
Immature Retic Fract: 22.1 % — ABNORMAL HIGH (ref 2.3–15.9)
RBC.: 2.24 MIL/uL — ABNORMAL LOW (ref 4.22–5.81)
Retic Count, Absolute: 54.7 10*3/uL (ref 19.0–186.0)
Retic Ct Pct: 2.4 % (ref 0.4–3.1)

## 2022-01-10 LAB — CBC WITH DIFFERENTIAL/PLATELET
Abs Immature Granulocytes: 0.04 10*3/uL (ref 0.00–0.07)
Basophils Absolute: 0 10*3/uL (ref 0.0–0.1)
Basophils Relative: 0 %
Eosinophils Absolute: 0.1 10*3/uL (ref 0.0–0.5)
Eosinophils Relative: 1 %
HCT: 25.5 % — ABNORMAL LOW (ref 39.0–52.0)
Hemoglobin: 9.3 g/dL — ABNORMAL LOW (ref 13.0–17.0)
Immature Granulocytes: 0 %
Lymphocytes Relative: 46 %
Lymphs Abs: 5.6 10*3/uL — ABNORMAL HIGH (ref 0.7–4.0)
MCH: 41.9 pg — ABNORMAL HIGH (ref 26.0–34.0)
MCHC: 36.5 g/dL — ABNORMAL HIGH (ref 30.0–36.0)
MCV: 114.9 fL — ABNORMAL HIGH (ref 80.0–100.0)
Monocytes Absolute: 0.6 10*3/uL (ref 0.1–1.0)
Monocytes Relative: 5 %
Neutro Abs: 5.7 10*3/uL (ref 1.7–7.7)
Neutrophils Relative %: 48 %
Platelets: 214 10*3/uL (ref 150–400)
RBC: 2.22 MIL/uL — ABNORMAL LOW (ref 4.22–5.81)
RDW: 18.7 % — ABNORMAL HIGH (ref 11.5–15.5)
WBC: 12.1 10*3/uL — ABNORMAL HIGH (ref 4.0–10.5)
nRBC: 0.2 % (ref 0.0–0.2)

## 2022-01-10 LAB — COMPREHENSIVE METABOLIC PANEL
ALT: 48 U/L — ABNORMAL HIGH (ref 0–44)
AST: 69 U/L — ABNORMAL HIGH (ref 15–41)
Albumin: 3.5 g/dL (ref 3.5–5.0)
Alkaline Phosphatase: 150 U/L — ABNORMAL HIGH (ref 38–126)
Anion gap: 10 (ref 5–15)
BUN: 41 mg/dL — ABNORMAL HIGH (ref 6–20)
CO2: 26 mmol/L (ref 22–32)
Calcium: 8.7 mg/dL — ABNORMAL LOW (ref 8.9–10.3)
Chloride: 101 mmol/L (ref 98–111)
Creatinine, Ser: 3.4 mg/dL — ABNORMAL HIGH (ref 0.61–1.24)
GFR, Estimated: 23 mL/min — ABNORMAL LOW (ref >60)
Glucose, Bld: 92 mg/dL (ref 70–99)
Potassium: 4.8 mmol/L (ref 3.5–5.1)
Sodium: 137 mmol/L (ref 135–145)
Total Bilirubin: 1.2 mg/dL (ref 0.3–1.2)
Total Protein: 6.5 g/dL (ref 6.5–8.1)

## 2022-01-10 LAB — LACTATE DEHYDROGENASE: LDH: 147 U/L (ref 98–192)

## 2022-01-10 MED ORDER — NALOXONE HCL 0.4 MG/ML IJ SOLN: 0.4000 mg | INTRAMUSCULAR | Status: DC | PRN

## 2022-01-10 MED ORDER — SODIUM CHLORIDE 0.9% FLUSH: 9.0000 mL | INTRAVENOUS | Status: DC | PRN

## 2022-01-10 MED ORDER — DIPHENHYDRAMINE HCL 25 MG PO CAPS: 25.0000 mg | ORAL_CAPSULE | ORAL | Status: DC | PRN

## 2022-01-10 MED ORDER — ACETAMINOPHEN 500 MG PO TABS
1000.0000 mg | ORAL_TABLET | Freq: Once | ORAL | Status: AC
  Administered 2022-01-10: 1000 mg via ORAL
  Filled 2022-01-10: qty 2

## 2022-01-10 MED ORDER — HYDROMORPHONE 1 MG/ML IV SOLN
INTRAVENOUS | Status: DC
  Administered 2022-01-10: 5.5 mg via INTRAVENOUS
  Administered 2022-01-10: 30 mg via INTRAVENOUS
  Filled 2022-01-10: qty 30

## 2022-01-10 MED ORDER — SODIUM CHLORIDE 0.45 % IV SOLN: INTRAVENOUS | Status: DC

## 2022-01-10 MED ORDER — ONDANSETRON HCL 4 MG/2ML IJ SOLN: 4.0000 mg | Freq: Four times a day (QID) | INTRAMUSCULAR | Status: DC | PRN

## 2022-01-10 NOTE — Progress Notes (Signed)
Pt came to [facility] today for labs. During visit, pt stated that he had Right knee swelling and pain and felt that he needed to receive fluids today. Provider, [place] Hollis FNP made aware and she advised that pt be admitted to day hospital for treatment today. Pt reported 10/10 pain to right knee. For pain, Pt received Dilaudid PCA and hydrated with IV fluids via PIV. Pt also received 1,000 mg PO Tylenol. At discharge, pt reported 5/10 pain. AVS offered, but pt declined. Pt awake, alert, and ambulatory at discharge.

## 2022-01-10 NOTE — H&P (Signed)
[facility] History and Physical   Date: 01/11/2022  Patient name: [name] [name]. Medical record number: [id number] Date of birth: 1980/06/02 Age: 41 y.o. Gender: male PCP: Massie Maroon, FNP  Attending physician: No att. providers found  Chief Complaint: Sickle cell pain   History of Present Illness: Turner Eckart is a 41 year old male with a medical history significant for sickle cell disease, chronic pain syndrome, opiate dependence and tolerance, stage IV chronic kidney disease, and anemia of chronic disease presents with complaints of right lower extremity pain that is consistent with his previous sickle cell pain crisis.  Patient states that right knee has been swollen and painful over the past 24 hours.  Pain has been unrelieved by his home medications.  He last had oxycodone this a.m. without very much relief.  He rates his pain as 8/10.  He states it is difficult to apply full weight.  He has not had any falls or injuries.  He denies headache, fever, chills, chest pain, or shortness of breath.  No urinary symptoms, nausea, vomiting, or diarrhea.  No sick contacts, recent travel, or known exposure to COVID-19.  Meds: No medications prior to admission.    Allergies: Nsaids and Morphine and related Past Medical History:  Diagnosis Date   Abscess of right iliac fossa 09/24/2018   required Perc Drain 09/24/2018   Arachnoid Cyst of brain bilaterally    "2 really small ones in the back of my head; inside; saw them w/MRI" (09/25/2012)   Bacterial pneumonia ~ 2012   "caught it here in the hospital" (09/25/2012)   Chronic kidney disease    "from my sickle cell" (09/25/2012)   CKD (chronic kidney disease), stage II    Colitis 04/19/2017   CT scan abd/ pelvis   GERD (gastroesophageal reflux disease)    "after I eat alot of spicey foods" (09/25/2012)   Gynecomastia, male 07/10/2012   History of blood transfusion    "always related to sickle cell crisis"  (09/25/2012)   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   Noted in noted from [facility]   Migraines    "take RX qd to prevent them" (09/25/2012)   Opioid dependence with withdrawal (HCC) 08/30/2012   Renal insufficiency    Sickle cell anemia (HCC)    Sickle cell crisis (HCC) 09/25/2012   Sickle cell nephropathy (HCC) 07/10/2012   Sinus tachycardia    Tachycardia with heart rate 121-140 beats per minute with ambulation 08/04/2016   Past Surgical History:  Procedure Laterality Date   ABCESS DRAINAGE     CHOLECYSTECTOMY  ~ 2012   COLONOSCOPY N/A 04/23/2017   Procedure: COLONOSCOPY;  Surgeon: Hilarie Fredrickson, MD;  Location: [facility];  Service: Endoscopy;  Laterality: N/A;   IR FLUORO GUIDE CV LINE RIGHT  12/17/2016   IR FLUORO GUIDE CV LINE RIGHT  02/07/2021   IR RADIOLOGIST EVAL & MGMT  10/02/2018   IR RADIOLOGIST EVAL & MGMT  10/15/2018   IR REMOVAL TUN CV CATH W/O FL  12/21/2016   IR REMOVAL TUN CV CATH W/O FL  02/11/2021   IR US GUIDE VASC ACCESS RIGHT  12/17/2016   IR US GUIDE VASC ACCESS RIGHT  02/07/2021   spleenectomy     Family History  Problem Relation Age of Onset   Breast cancer Mother    Social History   Socioeconomic History   Marital status: Single    Spouse name: Not on file   Number of  children: Not on file   Years of education: Not on file   Highest education level: Not on file  Occupational History   Not on file  Tobacco Use   Smoking status: Former    Packs/day: 0.10    Types: Cigarettes   Smokeless tobacco: Never  Vaping Use   Vaping Use: Never used  Substance and Sexual Activity   Alcohol use: Yes    Comment: Once a month   Drug use: Yes    Types: Marijuana    Comment: Every 2-3 weeks    Sexual activity: Yes    Birth control/protection: Condom  Other Topics Concern   Not on file  Social History Narrative    Lives alone in [place].   Back at school, studying Patent attorney. Unemployed.    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    Social Determinants of Health   Financial Resource Strain: Low Risk  (08/23/2020)   Overall Financial Resource Strain (CARDIA)    Difficulty of Paying Living Expenses: Not hard at all  Food Insecurity: No Food Insecurity (12/26/2021)   Hunger Vital Sign    Worried About Running Out of Food in the Last Year: Never true    Ran Out of Food in the Last Year: Never true  Transportation Needs: Unmet Transportation Needs (12/27/2021)   PRAPARE - Administrator, Civil Service (Medical): Yes    Lack of Transportation (Non-Medical): Yes  Physical Activity: Inactive (08/23/2020)   Exercise Vital Sign    Days of Exercise per Week: 0 days    Minutes of Exercise per Session: 0 min  Stress: Stress Concern Present (08/23/2020)   [employer] of Occupational Health - Occupational Stress Questionnaire    Feeling of Stress : To some extent  Social Connections: Socially Isolated (08/23/2020)   Social Connection and Isolation Panel [NHANES]    Frequency of Communication with Friends and Family: More than three times a week    Frequency of Social Gatherings with Friends and Family: More than three times a week    Attends Religious Services: Never    Database administrator or Organizations: No    Attends Banker Meetings: Never    Marital Status: Divorced  Catering manager Violence: Not At Risk (12/26/2021)   Humiliation, Afraid, Rape, and Kick questionnaire    Fear of Current or Ex-Partner: No    Emotionally Abused: No    Physically Abused: No    Sexually Abused: No   Review of Systems  Constitutional: Negative.   HENT: Negative.    Eyes: Negative.   Respiratory: Negative.    Cardiovascular: Negative.   Genitourinary: Negative.   Musculoskeletal:  Positive for back pain and joint pain.  Skin: Negative.    Neurological: Negative.   Psychiatric/Behavioral: Negative.       Physical Exam: Blood pressure 107/70, pulse 85, temperature 98 F (36.7 C), temperature source Temporal, resp. rate 14, SpO2 100 %. Physical Exam Constitutional:      Appearance: Normal appearance.  Eyes:     Pupils: Pupils are equal, round, and reactive to light.  Cardiovascular:     Rate and Rhythm: Normal rate and regular rhythm.     Pulses: Normal pulses.  Pulmonary:     Effort: Pulmonary effort is normal.  Abdominal:     General: Bowel sounds  are normal.  Musculoskeletal:        General: Normal range of motion.  Skin:    General: Skin is warm.  Neurological:     Mental Status: He is alert.  Psychiatric:        Mood and Affect: Mood normal.        Behavior: Behavior normal.        Thought Content: Thought content normal.        Judgment: Judgment normal.      Lab results: No results found. However, due to the size of the patient record, not all encounters were searched. Please check Results Review for a complete set of results.  Imaging results:  No results found.   Assessment & Plan: Patient admitted to sickle cell day infusion center for management of pain crisis.  Patient is opiate tolerant Initiate IV dilaudid PCA.  IV fluids, 0.45% saline at 100 ml/hr Toradol 15 mg IV times one dose Tylenol 1000 mg by mouth times one dose Review CBC with differential, complete metabolic panel, and reticulocytes as results become available. Pain intensity will be reevaluated in context of functioning and relationship to baseline as care progresses If pain intensity remains elevated and/or sudden change in hemodynamic stability transition to inpatient services for higher level of care.    [name]  APRN, MSN, FNP-C [facility] [address] [place], [place] [address] [phone]  01/11/2022, 2:52 PM

## 2022-01-10 NOTE — Progress Notes (Signed)
PATIENT CARE CENTER NOTE:  Provider: [place] Hollis FNP  Procedure: lab draw ( cbc w diff, cmp, reticulocyte, LDH)   Pt seen in the [facility] for lab draw. Labs collected peripherally by phlebotomist without incident. At this visit, pt stated his R knee is swollen and wants to speak with provider about receiving fluids today. [place] FNP made aware, pt admitted to day hospital for further treatment today.

## 2022-01-17 ENCOUNTER — Other Ambulatory Visit (HOSPITAL_COMMUNITY): Payer: Self-pay

## 2022-01-19 ENCOUNTER — Other Ambulatory Visit (HOSPITAL_COMMUNITY): Payer: Self-pay

## 2022-01-23 ENCOUNTER — Telehealth (HOSPITAL_COMMUNITY): Payer: Self-pay

## 2022-01-23 ENCOUNTER — Other Ambulatory Visit: Payer: Self-pay | Admitting: Family Medicine

## 2022-01-23 ENCOUNTER — Other Ambulatory Visit (HOSPITAL_COMMUNITY): Payer: Self-pay

## 2022-01-23 ENCOUNTER — Non-Acute Institutional Stay (HOSPITAL_COMMUNITY)
Admission: AD | Admit: 2022-01-23 | Discharge: 2022-01-23 | Disposition: A | Discharge status: Home / Self Care | Payer: Medicare Other | Source: Ambulatory Visit | Attending: Internal Medicine | Admitting: Internal Medicine

## 2022-01-23 DIAGNOSIS — F1120 Opioid dependence, uncomplicated: Secondary | ICD-10-CM | POA: Insufficient documentation

## 2022-01-23 DIAGNOSIS — D571 Sickle-cell disease without crisis: Secondary | ICD-10-CM

## 2022-01-23 DIAGNOSIS — M25461 Effusion, right knee: Secondary | ICD-10-CM | POA: Diagnosis not present

## 2022-01-23 DIAGNOSIS — N184 Chronic kidney disease, stage 4 (severe): Secondary | ICD-10-CM | POA: Diagnosis not present

## 2022-01-23 DIAGNOSIS — Z560 Unemployment, unspecified: Secondary | ICD-10-CM | POA: Diagnosis not present

## 2022-01-23 DIAGNOSIS — G894 Chronic pain syndrome: Secondary | ICD-10-CM

## 2022-01-23 DIAGNOSIS — Z87891 Personal history of nicotine dependence: Secondary | ICD-10-CM | POA: Insufficient documentation

## 2022-01-23 DIAGNOSIS — D5700 Hb-SS disease with crisis, unspecified: Secondary | ICD-10-CM | POA: Diagnosis present

## 2022-01-23 LAB — COMPREHENSIVE METABOLIC PANEL
ALT: 51 U/L — ABNORMAL HIGH (ref 0–44)
AST: 70 U/L — ABNORMAL HIGH (ref 15–41)
Albumin: 3.2 g/dL — ABNORMAL LOW (ref 3.5–5.0)
Alkaline Phosphatase: 159 U/L — ABNORMAL HIGH (ref 38–126)
Anion gap: 9 (ref 5–15)
BUN: 34 mg/dL — ABNORMAL HIGH (ref 6–20)
CO2: 23 mmol/L (ref 22–32)
Calcium: 8.9 mg/dL (ref 8.9–10.3)
Chloride: 109 mmol/L (ref 98–111)
Creatinine, Ser: 3.39 mg/dL — ABNORMAL HIGH (ref 0.61–1.24)
GFR, Estimated: 23 mL/min — ABNORMAL LOW (ref >60)
Glucose, Bld: 86 mg/dL (ref 70–99)
Potassium: 5 mmol/L (ref 3.5–5.1)
Sodium: 141 mmol/L (ref 135–145)
Total Bilirubin: 0.9 mg/dL (ref 0.3–1.2)
Total Protein: 6.5 g/dL (ref 6.5–8.1)

## 2022-01-23 LAB — CBC WITH DIFFERENTIAL/PLATELET
Abs Immature Granulocytes: 0.05 10*3/uL (ref 0.00–0.07)
Basophils Absolute: 0 10*3/uL (ref 0.0–0.1)
Basophils Relative: 0 %
Eosinophils Absolute: 0.1 10*3/uL (ref 0.0–0.5)
Eosinophils Relative: 2 %
HCT: 28.3 % — ABNORMAL LOW (ref 39.0–52.0)
Hemoglobin: 10.1 g/dL — ABNORMAL LOW (ref 13.0–17.0)
Immature Granulocytes: 1 %
Lymphocytes Relative: 51 %
Lymphs Abs: 4.1 10*3/uL — ABNORMAL HIGH (ref 0.7–4.0)
MCH: 42.3 pg — ABNORMAL HIGH (ref 26.0–34.0)
MCHC: 35.7 g/dL (ref 30.0–36.0)
MCV: 118.4 fL — ABNORMAL HIGH (ref 80.0–100.0)
Monocytes Absolute: 0.6 10*3/uL (ref 0.1–1.0)
Monocytes Relative: 8 %
Neutro Abs: 3 10*3/uL (ref 1.7–7.7)
Neutrophils Relative %: 38 %
Platelets: 142 10*3/uL — ABNORMAL LOW (ref 150–400)
RBC: 2.39 MIL/uL — ABNORMAL LOW (ref 4.22–5.81)
RDW: 18.4 % — ABNORMAL HIGH (ref 11.5–15.5)
WBC: 8 10*3/uL (ref 4.0–10.5)
nRBC: 0.3 % — ABNORMAL HIGH (ref 0.0–0.2)

## 2022-01-23 LAB — LACTATE DEHYDROGENASE: LDH: 149 U/L (ref 98–192)

## 2022-01-23 LAB — RETICULOCYTES
Immature Retic Fract: 21.6 % — ABNORMAL HIGH (ref 2.3–15.9)
RBC.: 2.41 MIL/uL — ABNORMAL LOW (ref 4.22–5.81)
Retic Count, Absolute: 69.4 10*3/uL (ref 19.0–186.0)
Retic Ct Pct: 2.9 % (ref 0.4–3.1)

## 2022-01-23 MED ORDER — DIPHENHYDRAMINE HCL 25 MG PO CAPS: 25.0000 mg | ORAL_CAPSULE | ORAL | Status: DC | PRN

## 2022-01-23 MED ORDER — HYDROMORPHONE 1 MG/ML IV SOLN
INTRAVENOUS | Status: DC
  Administered 2022-01-23: 30 mg via INTRAVENOUS
  Administered 2022-01-23: 5.5 mg via INTRAVENOUS
  Filled 2022-01-23: qty 30

## 2022-01-23 MED ORDER — ACETAMINOPHEN 500 MG PO TABS
1000.0000 mg | ORAL_TABLET | Freq: Once | ORAL | Status: AC
  Administered 2022-01-23: 1000 mg via ORAL
  Filled 2022-01-23: qty 2

## 2022-01-23 MED ORDER — SODIUM CHLORIDE 0.45 % IV SOLN: INTRAVENOUS | Status: DC

## 2022-01-23 MED ORDER — NALOXONE HCL 0.4 MG/ML IJ SOLN: 0.4000 mg | INTRAMUSCULAR | Status: DC | PRN

## 2022-01-23 MED ORDER — SODIUM CHLORIDE 0.9% FLUSH: 9.0000 mL | INTRAVENOUS | Status: DC | PRN

## 2022-01-23 MED ORDER — OXYCODONE HCL 10 MG PO TABS
10.0000 mg | ORAL_TABLET | Freq: Four times a day (QID) | ORAL | 0 refills | Status: DC | PRN
  Filled 2022-01-23: qty 60, 15d supply, fill #0

## 2022-01-23 MED ORDER — ONDANSETRON HCL 4 MG/2ML IJ SOLN: 4.0000 mg | Freq: Four times a day (QID) | INTRAMUSCULAR | Status: DC | PRN

## 2022-01-23 NOTE — Telephone Encounter (Signed)
Patient called the day hospital looking to come in for sickle cell pain treatment today. Pt reports 10/10 pain to bilateral legs. Pt reports taking 2 x 15 mg Oxycodone at 6 AM. Pt denies being seen in the Emergency Room. Pt screened for COVID and denies symptoms and exposure. Pt denies fever, chest pain, N/V/D, priapism, and abdominal pain. [place] Hollis, [place] notified and said that pt can come to day hospital today. Pt notified and verbalized understanding. Pt states that his mom will be his transportation to and from the day hospital today.

## 2022-01-23 NOTE — Progress Notes (Signed)
Pt came to day hospital today for sickle cell pain. On arrival, pt rated 10/10 pain to bilateral legs. Pt received Dilaudid PCA and hydrated with IV fluids via PIV. Pt also received 1,000 mg Tylenol. At discharge, pt rated 5/10 pain. AVS offered, but pt declined. Pt awake, alert, and ambulatory at discharge.

## 2022-01-23 NOTE — Discharge Summary (Signed)
[facility] Discharge Summary   Patient ID: [name] [name]. MRN: [id number] DOB/AGE: [birth year] 41 y.o.  Admit date: 01/23/2022 Discharge date: 01/23/2022  Primary Care Physician:  [name], FNP  Admission Diagnoses:  Active Problems:   Sickle cell pain crisis [facility])   Discharge Medications:  Allergies as of 01/23/2022       Reactions   Nsaids Other (See Comments)   Kidney disease   Morphine And Related Other (See Comments)   "Real bad headaches"         Medication List     TAKE these medications    amitriptyline 25 MG tablet Commonly known as: ELAVIL Take 50 mg by mouth at bedtime.   calcitRIOL 0.25 MCG capsule Commonly known as: ROCALTROL Take 0.25 mcg by mouth daily with breakfast.   Ferriprox Twice-A-Day 1000 MG Tabs Generic drug: Deferiprone (Twice Daily) Take 3,000 mg by mouth daily.   folic acid 1 MG tablet Commonly known as: FOLVITE TAKE 1 TABLET BY MOUTH EVERY DAY What changed: when to take this   hydroxyurea 500 MG capsule Commonly known as: HYDREA Take 500-1,000 mg by mouth See admin instructions. Take 500 mg by mouth in the morning on Sun/Tues/Wed/Thurs/Sat and 1,000 mg on Mon/Fri   Oxbryta 500 MG Tabs tablet Generic drug: voxelotor Take 1,500 mg by mouth daily with breakfast.   OxyCONTIN 15 mg 12 hr tablet Generic drug: oxyCODONE Take 1 tablet (15 mg total) by mouth every 12 (twelve) hours.   Oxycodone HCl 10 MG Tabs Take 1 tablet by mouth every 6 hours as needed (pain).   sodium bicarbonate 650 MG tablet Take 1,950 mg by mouth with breakfast, with lunch, and with evening meal.   Tylenol 8 Hour Arthritis Pain 650 MG CR tablet Generic drug: acetaminophen Take 650 mg by mouth every 8 (eight) hours as needed for pain.   Veltassa 16.8 g Pack Generic drug: Patiromer Sorbitex Calcium Take 16.8 g by mouth daily.         Consults:  None  Significant Diagnostic Studies:  DG Chest 2 View  Result  Date: 12/26/2021 CLINICAL DATA:  Sickle cell crisis, chest pain EXAM: CHEST - 2 VIEW COMPARISON:  10/11/2021 FINDINGS: Normal heart size, mediastinal contours, and pulmonary vascularity. Minimal peribronchial thickening and lingular scarring. Lungs otherwise clear. No acute infiltrate, pleural effusion, or pneumothorax. Chronic osseous changes of sickle cell disease with old bone infarct at RIGHT humeral head. IMPRESSION: No acute abnormalities. Electronically Signed   By: Lavonia Dana M.D.   On: 12/26/2021 08:08   DG Knee Complete 4 Views Left  Result Date: 12/26/2021 CLINICAL DATA:  Pain and swelling LEFT knee, sickle cell crisis for 3 days EXAM: LEFT KNEE - COMPLETE 4+ VIEW COMPARISON:  10/27/2021 FINDINGS: Osseous demineralization. Old bone infarct involving the lateral femoral condyle. Old bone infarct medial tibial plateau with collapse of the articular surface, little changed from previous exam when accounting for differences in positioning. No new fracture or dislocation. Small joint effusion. 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 No new abnormalities. Electronically Signed   By: Lavonia Dana M.D.   On: 12/26/2021 08:06   DG Knee Complete 4 Views Right  Result Date: 12/26/2021 CLINICAL DATA:  Pain and swelling for 3 days, sickle cell crisis EXAM: RIGHT KNEE - COMPLETE 4+ VIEW COMPARISON:  10/27/2021 FINDINGS: Osseous demineralization. Narrowing of knee joint spaces. Old  bone infarct fibular head. Old bone infarcts at the distal RIGHT femur with partial collapse and osteochondral fragments both femoral condyles. No new fracture or dislocation. Small associated joint effusion. IMPRESSION: Old bone infarcts at the distal RIGHT femur with partial collapse and osteochondral fragments at both femoral condyles, minimally progressive. Old bone infarct, RIGHT fibular head. No new  abnormalities. Electronically Signed   By: Lavonia Dana M.D.   On: 12/26/2021 08:04    History of present illness:  [name] [name] is a 41 year old male with a medical history significant for sickle cell disease, chronic pain syndrome, opiate dependence and tolerance, stage IV chronic kidney disease, anemia of chronic presents with complaints of right knee swelling and pain over the past 2 days.  Pain is consistent with previous sickle cell crisis.  He rates pain as 8/10, and throbbing.  Patient had both OxyContin and oxycodone this a.m. without sustained relief.  Patient has also been utilizing heat and cold packs to alleviate this problem without any success.  He denies any fever, chills, chest pain, or shortness of breath.  No urinary symptoms, nausea, vomiting, or diarrhea.  [facility] Course: Patient admitted for sickle cell pain crisis. Reviewed labs, creatinine 3.39, consistent with patient's baseline.  He is followed by [facility] nephrology.  All other laboratory values are consistent with patient's baseline.  Pain managed with IV Dilaudid PCA, IV fluids, and Tylenol. Pain intensity decreased to 5/10 and patient is requesting discharge home.  He is alert, oriented, and ambulating without assistance.  Patient will discharge home in a hemodynamically stable condition.  Discharge instructions: Resume all home medications.   Follow up with PCP as previously  scheduled.   Discussed the importance of drinking 64 ounces of water daily, dehydration of red blood cells may lead further sickling.   Avoid all stressors that precipitate sickle cell pain crisis.     The patient was given clear instructions to go to ER or return to medical center if symptoms do not improve, worsen or new problems develop.   Physical Exam at Discharge:  BP 110/71 (BP Location: Right Arm)   Pulse 84   Temp (!) 97.5 F (36.4 C) (Temporal)   Resp 15   SpO2 100%  Physical Exam Constitutional:       Appearance: Normal appearance.  HENT:     Mouth/Throat:     Mouth: Mucous membranes are moist.  Cardiovascular:     Rate and Rhythm: Normal rate and regular rhythm.     Pulses: Normal pulses.     Heart sounds: Murmur heard.  Pulmonary:     Effort: Pulmonary effort is normal.  Abdominal:     General: Bowel sounds are normal.  Musculoskeletal:        General: Normal range of motion.  Skin:    General: Skin is warm.  Neurological:     General: No focal deficit present.     Mental Status: He is alert and oriented to person, place, and time. Mental status is at baseline.      Disposition at Discharge: Discharge disposition: 01-Home or Self Care       Discharge Orders: Discharge Instructions     Discharge patient   Complete by: As directed    Discharge disposition: 01-Home or Self Care   Discharge patient date: 01/23/2022       Condition at Discharge:   Stable  Time spent on Discharge:  Greater than 30 minutes.  Signed: [name]  APRN, MSN,  FNP-C [facility] [address] [place], [place] [address] [phone]  01/23/2022, 3:37 PM

## 2022-01-23 NOTE — Progress Notes (Signed)
Reviewed PDMP substance reporting system prior to prescribing opiate medications. No inconsistencies noted.  Meds ordered this encounter  Medications   Oxycodone HCl 10 MG TABS    Sig: Take 1 tablet by mouth every 6 hours as needed (pain).    Dispense:  60 tablet    Refill:  0    Order Specific Question:   Supervising Provider    Answer:   [name], [name] [[id number]]   [name] Lacount Moore [name]  APRN, MSN, FNP-C [facility] [address]  [place], [place] [address] [phone]  

## 2022-01-23 NOTE — H&P (Signed)
[facility] History and Physical   Date: 01/23/2022  Patient name: [name] [name]. Medical record number: [id number] Date of birth: 11-11-1980 Age: 41 y.o. Gender: male PCP: [name], FNP  Attending physician: [name], MD  Chief Complaint: Sickle cell pain   History of present illness:  [name] [name] is a 41 year old male with a medical history significant for sickle cell disease, chronic pain syndrome, opiate dependence and tolerance, stage IV chronic kidney disease, anemia of chronic presents with complaints of right knee swelling and pain over the past 2 days.  Pain is consistent with previous sickle cell crisis.  He rates pain as 8/10, and throbbing.  Patient had both OxyContin and oxycodone this a.m. without sustained relief.  Patient has also been utilizing heat and cold packs to alleviate this problem without any success.  He denies any fever, chills, chest pain, or shortness of breath.  No urinary symptoms, nausea, vomiting, or diarrhea. Meds: Medications Prior to Admission  Medication Sig Dispense Refill Last Dose   amitriptyline (ELAVIL) 25 MG tablet Take 50 mg by mouth at bedtime.      calcitRIOL (ROCALTROL) 0.25 MCG capsule Take 0.25 mcg by mouth daily with breakfast.      Deferiprone, Twice Daily, (FERRIPROX TWICE-A-DAY) 1000 MG TABS Take 3,000 mg by mouth daily.      folic acid (FOLVITE) 1 MG tablet TAKE 1 TABLET BY MOUTH EVERY DAY (Patient taking differently: Take 1 mg by mouth every morning.) 90 tablet 4    hydroxyurea (HYDREA) 500 MG capsule Take 500-1,000 mg by mouth See admin instructions. Take 500 mg by mouth in the morning on Sun/Tues/Wed/Thurs/Sat and 1,000 mg on Mon/Fri      oxyCODONE (OXYCONTIN) 15 mg 12 hr tablet Take 1 tablet (15 mg total) by mouth every 12 (twelve) hours. 60 tablet 0    Oxycodone HCl 10 MG TABS Take 1 tablet by mouth every 6 hours as needed (pain). 60 tablet 0    sodium bicarbonate 650 MG tablet Take  1,950 mg by mouth with breakfast, with lunch, and with evening meal.      TYLENOL 8 HOUR ARTHRITIS PAIN 650 MG CR tablet Take 650 mg by mouth every 8 (eight) hours as needed for pain.      VELTASSA 16.8 g PACK Take 16.8 g by mouth daily.      voxelotor (OXBRYTA) 500 MG TABS tablet Take 1,500 mg by mouth daily with breakfast.       Allergies: Nsaids and Morphine and related Past Medical History:  Diagnosis Date   Abscess of right iliac fossa 09/24/2018   required Perc Drain 09/24/2018   Arachnoid Cyst of brain bilaterally    "2 really small ones in the back of my head; inside; saw them w/MRI" (09/25/2012)   Bacterial pneumonia ~ 2012   "caught it here in the hospital" (09/25/2012)   Chronic kidney disease    "from my sickle cell" (09/25/2012)   CKD (chronic kidney disease), stage II    Colitis 04/19/2017   CT scan abd/ pelvis   GERD (gastroesophageal reflux disease)    "after I eat alot of spicey foods" (09/25/2012)   Gynecomastia, male 07/10/2012   History of blood transfusion    "always related to sickle cell crisis" (09/25/2012)   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   Noted in noted from Hematology notes at [facility]    "take RX qd to prevent them" (09/25/2012)   Opioid dependence  with withdrawal ([place]) 08/30/2012   Renal insufficiency    Sickle cell anemia (HCC)    Sickle cell crisis ([place]) 09/25/2012   Sickle cell nephropathy ([place]) 07/10/2012   Sinus tachycardia    Tachycardia with heart rate 121-140 beats per minute with ambulation 08/04/2016   Past Surgical History:  Procedure Laterality Date   ABCESS DRAINAGE     CHOLECYSTECTOMY  ~ 2012   COLONOSCOPY N/A 04/23/2017   Procedure: COLONOSCOPY;  Surgeon: Irene Shipper, MD;  Location: [facility] ENDOSCOPY;  Service: Endoscopy;  Laterality: N/A;   IR FLUORO GUIDE CV LINE RIGHT  12/17/2016   IR FLUORO GUIDE CV LINE RIGHT  02/07/2021   IR RADIOLOGIST EVAL & MGMT  10/02/2018   IR RADIOLOGIST EVAL & MGMT  10/15/2018   IR  REMOVAL TUN CV CATH W/O FL  12/21/2016   IR REMOVAL TUN CV CATH W/O FL  02/11/2021   IR US GUIDE VASC ACCESS RIGHT  12/17/2016   IR US GUIDE VASC ACCESS RIGHT  02/07/2021   spleenectomy     Family History  Problem Relation Age of Onset   Breast cancer Mother    Social History   Socioeconomic History   Marital status: Single    Spouse name: Not on file   Number of children: Not on file   Years of education: Not on file   Highest education level: Not on file  Occupational History   Not on file  Tobacco Use   Smoking status: Former    Packs/day: 0.10    Types: Cigarettes   Smokeless tobacco: Never  Vaping Use   Vaping Use: Never used  Substance and Sexual Activity   Alcohol use: Yes    Comment: Once a month   Drug use: Yes    Types: Marijuana    Comment: Every 2-3 weeks    Sexual activity: Yes    Birth control/protection: Condom  Other Topics Concern   Not on file  Social History Narrative    Lives alone in [place].   Back at school, studying Public relations account executive. Unemployed.    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    Social Determinants of Health   Financial Resource Strain: Low Risk  (08/23/2020)   Overall Financial Resource Strain (CARDIA)    Difficulty of Paying Living Expenses: Not hard at all  Food Insecurity: No Food Insecurity (12/26/2021)   Hunger Vital Sign    Worried About Running Out of Food in the Last Year: Never true    Ran Out of Food in the Last Year: Never true  Transportation Needs: Unmet Transportation Needs (12/27/2021)   PRAPARE - Hydrologist (Medical): Yes    Lack of Transportation (Non-Medical): Yes  Physical Activity: Inactive (08/23/2020)   Exercise Vital Sign    Days of Exercise per Week: 0 days    Minutes of Exercise per Session: 0 min  Stress: Stress Concern  Present (08/23/2020)   [place]    Feeling of Stress : To some extent  Social Connections: Socially Isolated (08/23/2020)   Social Connection and Isolation Panel [NHANES]    Frequency of Communication with Friends and Family: More than three times a week    Frequency of Social Gatherings with Friends and Family: More than three times a week  Attends Religious Services: Never    Active Member of Clubs or Organizations: No    Attends Archivist Meetings: Never    Marital Status: Divorced  Human resources officer Violence: Not At Risk (12/26/2021)   Humiliation, Afraid, Rape, and Kick questionnaire    Fear of Current or Ex-Partner: No    Emotionally Abused: No    Physically Abused: No    Sexually Abused: No   Review of Systems  Constitutional:  Negative for chills and fever.  HENT: Negative.    Eyes: Negative.   Respiratory: Negative.    Cardiovascular: Negative.   Gastrointestinal: Negative.   Genitourinary: Negative.   Musculoskeletal:  Positive for back pain and joint pain.  Skin: Negative.   Neurological: Negative.   Psychiatric/Behavioral: Negative.      Physical Exam: Blood pressure 121/73, pulse 95, temperature (!) 97.5 F (36.4 C), temperature source Temporal, resp. rate 16, SpO2 100 %. Physical Exam Constitutional:      Appearance: Normal appearance.  Eyes:     Pupils: Pupils are equal, round, and reactive to light.  Cardiovascular:     Rate and Rhythm: Normal rate and regular rhythm.     Pulses: Normal pulses.     Heart sounds: Murmur heard.  Pulmonary:     Effort: Pulmonary effort is normal.  Abdominal:     General: Bowel sounds are normal.  Skin:    General: Skin is warm.  Neurological:     General: No focal deficit present.     Mental Status: He is alert. Mental status is at baseline.  Psychiatric:        Mood and Affect: Mood normal.        Thought Content: Thought content normal.         Judgment: Judgment normal.      Lab results: No results found. However, due to the size of the patient record, not all encounters were searched. Please check Results Review for a complete set of results.  Imaging results:  No results found.   Assessment & Plan: Patient admitted to sickle cell day infusion center for management of pain crisis.  Patient is opiate tolerant Initiate IV dilaudid PCA. IV fluids, 0.45% saline at 100 ml/hour Tylenol 1000 mg by mouth times one dose Review CBC with differential, complete metabolic panel, and reticulocytes as results become available. Pain intensity will be reevaluated in context of functioning and relationship to baseline as care progresses If pain intensity remains elevated and/or sudden change in hemodynamic stability transition to inpatient services for higher level of care.      [name]  APRN, MSN, FNP-C [facility] [address] [place], [place] [address] [phone]  01/23/2022, 9:21 AM

## 2022-01-24 ENCOUNTER — Ambulatory Visit (INDEPENDENT_AMBULATORY_CARE_PROVIDER_SITE_OTHER): Payer: Medicare Other | Admitting: Family Medicine

## 2022-01-24 VITALS — BP 99/72 | HR 98 | Temp 98.2°F | Ht 63.0 in | Wt 119.4 lb

## 2022-01-24 DIAGNOSIS — N184 Chronic kidney disease, stage 4 (severe): Secondary | ICD-10-CM

## 2022-01-24 DIAGNOSIS — Z13220 Encounter for screening for lipoid disorders: Secondary | ICD-10-CM | POA: Diagnosis not present

## 2022-01-24 DIAGNOSIS — Z23 Encounter for immunization: Secondary | ICD-10-CM

## 2022-01-24 DIAGNOSIS — G894 Chronic pain syndrome: Secondary | ICD-10-CM | POA: Diagnosis not present

## 2022-01-24 DIAGNOSIS — D571 Sickle-cell disease without crisis: Secondary | ICD-10-CM

## 2022-01-24 NOTE — Patient Instructions (Signed)
Living With Sickle Cell Disease Living with a long-term condition, such as sickle cell disease, can be a challenge. It can affect both your physical and mental health. You may not have total control over your condition. But proper care and treatment can help manage the effects of the disease so you can feel good and lead an active life. You can take steps to manage your condition and stay as healthy as possible. How does sickle cell disease affect me? Sickle cell disease can cause challenges that affect your quality of life. You may get sick more often as a result of organ damage and infections. Sometimes you may need to stay in the hospital. Learn how to recognize that you are not feeling well and that you may be getting sick. What actions can I take to manage my condition?  The goals of treatment are to control your symptoms and prevent and treat problems. Work with your health care provider to create a treatment plan that works for you. Taking an active role in managing your condition can help you feel more in control of your situation. Ask about possible side effects of medicines that your health care provider recommends. Discuss how you feel about having those side effects. Keeping a healthy lifestyle can help you manage your condition. This includes eating a healthy diet, getting enough sleep, and getting regular exercise. Sickle cell disease may affect your ability to take care of your basic needs. Tell your health care provider if you have concerns about any of these needs: Access to food. Housing. Safe drinking water and other utilities. Safety in your home and community. Work or school. Transportation. Paying for health care. Your health care provider may be able to connect you with community resources that can help you. How to manage stress  Living with sickle cell disease can be stressful. This disease can have a big impact on your mental health. Talk with your health care provider  about ways to reduce your stress or if you have concerns about your mental health.  To cope with stress, try: Keeping a stress diary. This can help you learn what causes your stress to start (figure out your triggers) and how to control your response to those triggers. Spending time doing things that you enjoy, such as: Hobbies. Being outdoors. Spending time with friends and people who make you laugh. Doing yoga, muscle relaxation, deep breathing, or mindfulness practices. Expressing yourself through journal writing, art, crafting, poetry, or playing music. Staying positive about your health. Try to accept that you cannot control your condition perfectly. Follow these instructions at home: Medicines Take over-the-counter and prescription medicines only as told by your health care provider. If you were prescribed antibiotics, take them as told by your health care provider. Do not stop taking them even if you start to feel better. If you develop a fever, do not take medicines to reduce the fever right away. This could cover up another problem. Contact your health care provider. Eating and drinking Drink enough fluid to keep your urine pale yellow. Drink more in hot weather and during exercise. Limit or avoid drinking alcohol. Eat a balanced and nutritious diet. Eat plenty of fruits, vegetables, whole grains, and lean protein. Take vitamins and supplements as told by your health care provider. Traveling When traveling, keep these with you: Your medical information. The names of your health care providers. Your medicines. If you have to travel by air, ask about precautions you should take. Managing pain Work with   your health care provider to create a pain management plan that works for you. The plan may include: Ways to reduce or manage your pain at home, such as: Using a heating pad. Taking a warm bath. Using healthy ways to distract you from the pain, such as hobbies or  reading. Practicing ways to relax, such as doing yoga or listening to music. Getting massages. Doing exercises or stretches as told by a physical therapist. Tracking how pain affects your daily life functions. When to seek help. Who to contact and what to do in case of a pain emergency. General instructions Do not use any products that contain nicotine or tobacco. These products include cigarettes, chewing tobacco, and vaping devices, such as e-cigarettes. These lower blood oxygen levels. If you need help quitting, ask your health care provider. Consider wearing a medical alert bracelet. Use an app or journal to track your symptoms, assess your level of pain and fatigue, and keep track of your medicines. Avoid the following: High altitudes. Very high or low temperatures and big changes in temperature. Activities that will lower your oxygen levels, such as mountain climbing or doing exercise that takes a lot of effort. Stay up to date on: Your treatment plan. Learn as much as you can about your condition. Health screenings. This will help prevent problems or catch them early on. Vaccines. This will help prevent infection. Wash your hands often with soap and water to help prevent infections. Wash them for at least 20 seconds each time. Keep all follow-up visits. Regular follow-up with your health care provider can help you better manage your condition. Where to find support You can find help and support through: Talking with a therapist or taking part in support groups. Sickle Cell Disease Foundation of [place]: www.sicklecelldisease.org Where to find more information Centers for Disease Control and Prevention: www.cdc.gov American Society of Hematology: www.hematology.org Contact a health care provider if: Your symptoms get worse. You have new symptoms. You have a fever. Get help right away if: You have a painful erection of the penis that lasts a long time (priapism). You become  short of breath or are having trouble breathing. You have pain that cannot be controlled with medicine. You have any signs of a stroke. "BE FAST" is an easy way to remember the main warning signs: B - Balance. Dizziness, sudden trouble walking, or loss of balance. E - Eyes. Trouble seeing or a change in how you see. F - Face. Sudden weakness or loss of feeling of the face. The face or eyelid may droop on one side. A - Arms. Weakness or loss of feeling in an arm. This happens all of a sudden and most often on one side of the body. S - Speech. Sudden trouble speaking, slurred speech, or trouble understanding what people say. T - Time. Time to call emergency services. Write down what time symptoms started. You have other signs of a stroke, such as: A sudden, very bad headache with no known cause. Feeling like you may vomit (nausea). Vomiting. Seizure. These symptoms may be an emergency. Get help right away. Call 911. Do not wait to see if the symptoms will go away. Do not drive yourself to the hospital. Also, get help right away if: You have strong feelings of sadness or loss of hope, or you have thoughts about hurting yourself or others. Take one of these steps if you feel like you may hurt yourself or others, or have thoughts about taking your own life:   Go to your nearest emergency room. Call 911. Call the National Suicide Prevention Lifeline at 1-800-273-8255 or 988. This is open 24 hours a day. Text the Crisis Text Line at 741741. Summary Proper care and treatment can help manage the effects of sickle cell disease so you can feel good and lead an active life. The goals of treatment are to control your symptoms and prevent and treat problems. Taking an active role in managing your condition can help you feel more in control of your situation. Work with your health care provider to create a pain management plan that works for you. Get medical help right away as told by your health care  provider. This information is not intended to replace advice given to you by your health care provider. Make sure you discuss any questions you have with your health care provider. Document Revised: 05/09/2021 Document Reviewed: 05/09/2021 Elsevier Patient Education  2023 [employer]  
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
## 2022-01-25 LAB — BASIC METABOLIC PANEL
BUN/Creatinine Ratio: 10 (ref 9–20)
BUN: 35 mg/dL — ABNORMAL HIGH (ref 6–20)
CO2: 16 mmol/L — ABNORMAL LOW (ref 20–29)
Calcium: 8.6 mg/dL — ABNORMAL LOW (ref 8.7–10.2)
Chloride: 104 mmol/L (ref 96–106)
Creatinine, Ser: 3.37 mg/dL — ABNORMAL HIGH (ref 0.76–1.27)
Glucose: 91 mg/dL (ref 70–99)
Potassium: 5.9 mmol/L — ABNORMAL HIGH (ref 3.5–5.2)
Sodium: 137 mmol/L (ref 134–144)
eGFR: 23 mL/min/{1.73_m2} — ABNORMAL LOW (ref >59)

## 2022-01-25 LAB — LIPID PANEL
Chol/HDL Ratio: 2.4 ratio (ref 0.0–5.0)
Cholesterol, Total: 137 mg/dL (ref 100–199)
HDL: 58 mg/dL (ref >39)
LDL Chol Calc (NIH): 62 mg/dL (ref 0–99)
Triglycerides: 91 mg/dL (ref 0–149)
VLDL Cholesterol Cal: 17 mg/dL (ref 5–40)

## 2022-01-27 ENCOUNTER — Other Ambulatory Visit: Payer: Self-pay | Admitting: Family Medicine

## 2022-01-27 MED ORDER — HYDROXYUREA 500 MG PO CAPS: 500.0000 mg | ORAL_CAPSULE | ORAL | 1 refills | Status: DC

## 2022-01-27 NOTE — Progress Notes (Signed)
Meds ordered this encounter  Medications   hydroxyurea (HYDREA) 500 MG capsule    Sig: Take 1-2 capsules (500-1,000 mg total) by mouth See admin instructions. Take 500 mg by mouth in the morning on Sun/Tues/Wed/Thurs/Sat and 1,000 mg on Mon/Fri    Dispense:  260 capsule    Refill:  1    Order Specific Question:   Supervising Provider    Answer:   [name] [[id number]]   [name]  APRN, MSN, FNP-C Patient [place] [address] [place], [place] [address] [phone]

## 2022-01-31 LAB — OXYCODONE/OXYMORPHONE, CONFIRM
OXYCODONE/OXYMORPH: POSITIVE — AB
OXYCODONE: 1269 ng/mL
OXYCODONE: POSITIVE — AB
OXYMORPHONE (GC/MS): >3000 ng/mL
OXYMORPHONE: POSITIVE — AB

## 2022-01-31 LAB — DRUG SCREEN 764883 11+OXYCO+ALC+CRT-BUND
Amphetamines, Urine: NEGATIVE ng/mL
BENZODIAZ UR QL: NEGATIVE ng/mL
Barbiturate: NEGATIVE ng/mL
Cocaine (Metabolite): NEGATIVE ng/mL
Creatinine: 73.8 mg/dL (ref 20.0–300.0)
Ethanol: NEGATIVE %
Meperidine: NEGATIVE ng/mL
Methadone Screen, Urine: NEGATIVE ng/mL
Phencyclidine: NEGATIVE ng/mL
Propoxyphene: NEGATIVE ng/mL
Tramadol: NEGATIVE ng/mL
pH, Urine: 7.1 (ref 4.5–8.9)

## 2022-01-31 LAB — OPIATES CONFIRMATION, URINE
Codeine: NEGATIVE
Hydrocodone: NEGATIVE
Hydromorphone Confirm: 611 ng/mL
Hydromorphone: POSITIVE — AB
Morphine: NEGATIVE
Opiates: POSITIVE ng/mL — AB

## 2022-01-31 LAB — CANNABINOID CONFIRMATION, UR
CANNABINOIDS: POSITIVE — AB
Carboxy THC GC/MS Conf: 72 ng/mL

## 2022-02-07 ENCOUNTER — Telehealth: Payer: Self-pay | Admitting: Internal Medicine

## 2022-02-07 NOTE — Telephone Encounter (Signed)
Caller & Relationship to patient:  MRN #  [id number]   Call Back Number:   Date of Last Office Visit: 01/24/2022     Date of Next Office Visit: 02/09/2022    Medication(s) to be Refilled: Oxycodone 10mg  and Oxycodone 15mg   Preferred Pharmacy:   ** Please notify patient to allow 48-72 hours to process** **Let patient know to contact pharmacy at the end of the day to make sure medication is ready. ** **If patient has not been seen in a year or longer, book an appointment **Advise to use MyChart for refill requests OR to contact their pharmacy

## 2022-02-08 ENCOUNTER — Other Ambulatory Visit: Payer: Self-pay | Admitting: Family Medicine

## 2022-02-08 DIAGNOSIS — G894 Chronic pain syndrome: Secondary | ICD-10-CM

## 2022-02-08 DIAGNOSIS — D571 Sickle-cell disease without crisis: Secondary | ICD-10-CM

## 2022-02-09 ENCOUNTER — Other Ambulatory Visit: Payer: Self-pay

## 2022-02-09 ENCOUNTER — Ambulatory Visit (INDEPENDENT_AMBULATORY_CARE_PROVIDER_SITE_OTHER): Payer: Medicare Other

## 2022-02-09 VITALS — Ht 63.0 in | Wt 121.0 lb

## 2022-02-09 DIAGNOSIS — G894 Chronic pain syndrome: Secondary | ICD-10-CM

## 2022-02-09 DIAGNOSIS — D571 Sickle-cell disease without crisis: Secondary | ICD-10-CM

## 2022-02-09 DIAGNOSIS — Z0000 Encounter for general adult medical examination without abnormal findings: Secondary | ICD-10-CM

## 2022-02-09 MED ORDER — OXYCODONE HCL ER 15 MG PO T12A: 15.0000 mg | EXTENDED_RELEASE_TABLET | Freq: Two times a day (BID) | ORAL | 0 refills | Status: DC

## 2022-02-09 MED ORDER — OXYCODONE HCL 10 MG PO TABS: 10.0000 mg | ORAL_TABLET | Freq: Four times a day (QID) | ORAL | 0 refills | Status: DC | PRN

## 2022-02-09 NOTE — Progress Notes (Addendum)
I connected with  [name] [name]. on 02/14/22 by a audio enabled telemedicine application and verified that I am speaking with the correct person using two identifiers.  Patient Location: Home  Provider Location: Home Office  I discussed the limitations of evaluation and management by telemedicine. The patient expressed understanding and agreed to proceed.  Subjective:   [name] [name]. is a 41 y.o. male who presents for Medicare Annual/Subsequent preventive examination.  Review of Systems    Defer to PCP Cardiac Risk Factors include: male gender     Objective:    Today's Vitals   02/09/22 0833  Weight: 121 lb (54.9 kg)  Height: 5\' 3"  (1.6 m)  PainSc: 0-No pain   Body mass index is 21.43 kg/m.     02/09/2022    8:39 AM 12/26/2021    7:21 AM 11/16/2021   12:50 PM 10/27/2021    2:20 PM 10/26/2021    7:32 PM 10/11/2021   10:02 AM 09/22/2021    4:38 PM  Advanced Directives  Does Patient Have a Medical Advance Directive? No No No  No No   Would patient like information on creating a medical advance directive? Yes (MAU/Ambulatory/Procedural Areas - Information given) No - Patient declined  No - Patient declined  No - Patient declined No - Patient declined    Current Medications (verified) Outpatient Encounter Medications as of 02/09/2022  Medication Sig   amitriptyline (ELAVIL) 25 MG tablet Take 50 mg by mouth at bedtime.   calcitRIOL (ROCALTROL) 0.25 MCG capsule Take 0.25 mcg by mouth daily with breakfast.   Deferiprone, Twice Daily, (FERRIPROX TWICE-A-DAY) 1000 MG TABS Take 3,000 mg by mouth daily.   folic acid (FOLVITE) 1 MG tablet TAKE 1 TABLET BY MOUTH EVERY DAY (Patient taking differently: Take 1 mg by mouth every morning.)   hydroxyurea (HYDREA) 500 MG capsule Take 1-2 capsules (500-1,000 mg total) by mouth See admin instructions. Take 500 mg by mouth in the morning on Sun/Tues/Wed/Thurs/Sat and 1,000 mg on Mon/Fri   oxyCODONE (OXYCONTIN) 15 mg 12 hr tablet  Take 1 tablet (15 mg total) by mouth every 12 (twelve) hours.   Oxycodone HCl 10 MG TABS Take 1 tablet by mouth every 6 hours as needed (pain).   sodium bicarbonate 650 MG tablet Take 1,950 mg by mouth with breakfast, with lunch, and with evening meal.   TYLENOL 8 HOUR ARTHRITIS PAIN 650 MG CR tablet Take 650 mg by mouth every 8 (eight) hours as needed for pain.   VELTASSA 16.8 g PACK Take 16.8 g by mouth daily.   voxelotor (OXBRYTA) 500 MG TABS tablet Take 1,500 mg by mouth daily with breakfast.   No facility-administered encounter medications on file as of 02/09/2022.    Allergies (verified) Nsaids and Morphine and related   History: Past Medical History:  Diagnosis Date   Abscess of right iliac fossa 09/24/2018   required Perc Drain 09/24/2018   Arachnoid Cyst of brain bilaterally    "2 really small ones in the back of my head; inside; saw them w/MRI" (09/25/2012)   Bacterial pneumonia ~ 2012   "caught it here in the hospital" (09/25/2012)   Chronic kidney disease    "from my sickle cell" (09/25/2012)   CKD (chronic kidney disease), stage II    Colitis 04/19/2017   CT scan abd/ pelvis   GERD (gastroesophageal reflux disease)    "after I eat alot of spicey foods" (09/25/2012)   Gynecomastia, male 07/10/2012   History of  blood transfusion    "always related to sickle cell crisis" (09/25/2012)   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   Noted in noted from Hematology notes at [place] [facility]    "take RX qd to prevent them" (09/25/2012)   Opioid dependence with withdrawal ([place]) 08/30/2012   Renal insufficiency    Sickle cell anemia ([facility])    Sickle cell crisis ([place]) 09/25/2012   Sickle cell nephropathy ([place]) 07/10/2012   Sinus tachycardia    Tachycardia with heart rate 121-140 beats per minute with ambulation 08/04/2016   Past Surgical History:  Procedure Laterality Date   ABCESS DRAINAGE     CHOLECYSTECTOMY  ~ 2012   COLONOSCOPY N/A 04/23/2017   Procedure:  COLONOSCOPY;  Surgeon: Irene Shipper, MD;  Location: [facility];  Service: Endoscopy;  Laterality: N/A;   IR FLUORO GUIDE CV LINE RIGHT  12/17/2016   IR FLUORO GUIDE CV LINE RIGHT  02/07/2021   IR RADIOLOGIST EVAL & MGMT  10/02/2018   IR RADIOLOGIST EVAL & MGMT  10/15/2018   IR REMOVAL TUN CV CATH W/O FL  12/21/2016   IR REMOVAL TUN CV CATH W/O FL  02/11/2021   IR US GUIDE VASC ACCESS RIGHT  12/17/2016   IR US GUIDE VASC ACCESS RIGHT  02/07/2021   spleenectomy     Family History  Problem Relation Age of Onset   Breast cancer Mother    Social History   Socioeconomic History   Marital status: Single    Spouse name: Not on file   Number of children: Not on file   Years of education: Not on file   Highest education level: Not on file  Occupational History   Not on file  Tobacco Use   Smoking status: Some Days    Packs/day: 0.10    Types: Cigarettes   Smokeless tobacco: Never  Vaping Use   Vaping Use: Never used  Substance and Sexual Activity   Alcohol use: Yes    Comment: Once a month   Drug use: Yes    Types: Marijuana    Comment: Every 2-3 weeks    Sexual activity: Yes    Birth control/protection: Condom  Other Topics Concern   Not on file  Social History Narrative    Lives alone in [place].   Back at school, studying Public relations account executive. Unemployed.    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    Social Determinants of Health   Financial Resource Strain: Low Risk  (02/09/2022)   Overall Financial Resource Strain (CARDIA)    Difficulty of Paying Living Expenses: Not hard at all  Food Insecurity: No Food Insecurity (02/09/2022)   Hunger Vital Sign    Worried About Running Out of Food in the Last Year: Never true    Ran Out of Food in the Last Year: Never true  Transportation Needs: No Transportation Needs (02/09/2022)    PRAPARE - Hydrologist (Medical): No    Lack of Transportation (Non-Medical): No  Recent Concern: Transportation Needs - Unmet Transportation Needs (12/27/2021)   PRAPARE - Transportation    Lack of Transportation (Medical): Yes    Lack of Transportation (Non-Medical): Yes  Physical Activity: Sufficiently Active (02/09/2022)   Exercise Vital Sign    Days of Exercise per Week: 7 days    Minutes of Exercise  per Session: 30 min  Stress: No Stress Concern Present (02/09/2022)   [place]    Feeling of Stress : Not at all  Social Connections: Socially Isolated (02/09/2022)   Social Connection and Isolation Panel [NHANES]    Frequency of Communication with Friends and Family: More than three times a week    Frequency of Social Gatherings with Friends and Family: Once a week    Attends Religious Services: Never    Marine scientist or Organizations: No    Attends Music therapist: Never    Marital Status: Divorced    Tobacco Counseling Ready to quit: Not Answered Counseling given: Not Answered   Clinical Intake:  Pre-visit preparation completed: Yes  Pain : No/denies pain Pain Score: 0-No pain     Nutritional Risks: None Diabetes: No  How often do you need to have someone help you when you read instructions, pamphlets, or other written materials from your doctor or pharmacy?: 1 - Never What is the last grade level you completed in school?: 12  Diabetic?no  Interpreter Needed?: No  Information entered by :: nikki m, cma   Activities of Daily Living    02/09/2022    8:40 AM 01/23/2022   11:18 AM  In your present state of health, do you have any difficulty performing the following activities:  Hearing? 0 0  Vision? 0 0  Difficulty concentrating or making decisions? 0 1  Walking or climbing stairs? 0 1  Dressing or bathing? 0 0  Doing errands, shopping? 0    Preparing Food and eating ? N   Using the Toilet? N   In the past six months, have you accidently leaked urine? N   Do you have problems with loss of bowel control? N   Managing your Medications? N   Managing your Finances? N   Housekeeping or managing your Housekeeping? N     Patient Care Team: [name], FNP as PCP - General (Family Medicine) Buford Dresser, MD as PCP - Cardiology (Cardiology) Kennon Holter, Lolita Cram, NP (Inactive) as Nurse Practitioner (Internal Medicine)  Indicate any recent Medical Services you may have received from other than [facility] providers in the past year (date may be approximate).     Assessment:   This is a routine wellness examination for Norm.  Hearing/Vision screen No results found.  Dietary issues and exercise activities discussed: Current Exercise Habits: Home exercise routine, Type of exercise: walking, Time (Minutes): 30, Frequency (Times/Week): 5, Weekly Exercise (Minutes/Week): 150, Intensity: Mild, Exercise limited by: None identified   Goals Addressed   None   Depression Screen    02/09/2022    8:38 AM 01/24/2022   11:17 AM 10/25/2021   10:51 AM 07/26/2021   10:26 AM 04/19/2021   10:44 AM 01/11/2021   10:13 AM 09/30/2020   10:32 AM  PHQ 2/9 Scores  PHQ - 2 Score 0 0 0 0 0 0 0  PHQ- 9 Score   0        Fall Risk    02/09/2022    8:39 AM 07/26/2021   10:26 AM 07/26/2021   10:25 AM 04/19/2021   10:43 AM 01/11/2021   10:12 AM  Fall Risk   Falls in the past year? 0 0 0 0 0  Number falls in past yr: 0 0 0 0 0  Injury with Fall? 0 0 0 0 0  Risk for fall due to : No Fall  Risks No Fall Risks     Follow up Falls evaluation completed;Education provided;Falls prevention discussed Falls evaluation completed Falls evaluation completed      FALL RISK PREVENTION PERTAINING TO THE HOME:  Any stairs in or around the home? No  If so, are there any without handrails? No  Home free of loose throw rugs in walkways, pet beds,  electrical cords, etc? Yes  Adequate lighting in your home to reduce risk of falls? Yes   ASSISTIVE DEVICES UTILIZED TO PREVENT FALLS:  Life alert? No  Use of a cane, walker or w/c? No  Grab bars in the bathroom? No  Shower chair or bench in shower? No  Elevated toilet seat or a handicapped toilet? No   TIMED UP AND GO:  Was the test performed? No .  Length of time to ambulate 10 feet: n/a sec.     Cognitive Function:        02/09/2022    8:40 AM  6CIT Screen  What Year? 0 points  What month? 0 points  What time? 0 points  Count back from 20 0 points  Repeat phrase 0 points    Immunizations Immunization History  Administered Date(s) Administered   Influenza Split 12/08/2011   Influenza,inj,Quad PF,6+ Mos 10/11/2012, 12/13/2016, 11/15/2017, 01/06/2019, 01/28/2019, 01/13/2020, 12/15/2020, 01/24/2022   Influenza-Unspecified 10/11/2012, 12/13/2016   Moderna Sars-Covid-2 Vaccination 05/29/2019, 06/26/2019   Pneumococcal Conjugate-13 08/23/2017   Pneumococcal Polysaccharide-23 12/13/2016   Td 09/18/2004   Tdap 06/13/2017    TDAP status: Up to date  Flu Vaccine status: Up to date  Pneumococcal vaccine status: Up to date  Covid-19 vaccine status: Information provided on how to obtain vaccines.   Qualifies for Shingles Vaccine? No   Zostavax completed No   Shingrix Completed?: No.    Education has been provided regarding the importance of this vaccine. Patient has been advised to call insurance company to determine out of pocket expense if they have not yet received this vaccine. Advised may also receive vaccine at local pharmacy or Health Dept. Verbalized acceptance and understanding.  Screening Tests Health Maintenance  Topic Date Due   COVID-19 Vaccine (3 - Moderna risk series) 02/10/2023 (Originally 07/24/2019)   Medicare Annual Wellness (AWV)  02/10/2023   DTaP/Tdap/Td (3 - Td or Tdap) 06/14/2027   INFLUENZA VACCINE  Completed   Hepatitis C Screening   Completed   HIV Screening  Completed   HPV VACCINES  Aged Out    Health Maintenance  There are no preventive care reminders to display for this patient.   Colorectal screening:Not required due to age  Lung Cancer Screening: (Low Dose CT Chest recommended if Age 68-80 years, 30 pack-year currently smoking OR have quit w/in 15years.) does not qualify.   Lung Cancer Screening Referral: n/a  Additional Screening:  Hepatitis C Screening: does qualify; Completed 04/23/2017  Vision Screening: Recommended annual ophthalmology exams for early detection of glaucoma and other disorders of the eye. Is the patient up to date with their annual eye exam?  Yes  Who is the provider or what is the name of the office in which the patient attends annual eye exams? [facility] If pt is not established with a provider, would they like to be referred to a provider to establish care? No .   Dental Screening: Recommended annual dental exams for proper oral hygiene  Community Resource Referral / Chronic Care Management: CRR required this visit?  No   CCM required this visit?  No  Plan:     I have personally reviewed and noted the following in the patient's chart:   Medical and social history Use of alcohol, tobacco or illicit drugs  Current medications and supplements including opioid prescriptions. Patient is currently taking opioid prescriptions. Information provided to patient regarding non-opioid alternatives. Patient advised to discuss non-opioid treatment plan with their provider. Functional ability and status Nutritional status Physical activity Advanced directives List of other physicians Hospitalizations, surgeries, and ER visits in previous 12 months Vitals Screenings to include cognitive, depression, and falls Referrals and appointments  In addition, I have reviewed and discussed with patient certain preventive protocols, quality metrics, and best practice  recommendations. A written personalized care plan for preventive services as well as general preventive health recommendations were provided to patient.     Angus Seller, CMA   02/09/2022   Nurse Notes: Non face to face minutes spent with patient 25. Patient is in need of refills. I will send request. He is asking for advanced directives to be sent so I will mail Monday.

## 2022-02-09 NOTE — Patient Instructions (Signed)
[name] [name] , Thank you for taking time to come for your Medicare Wellness Visit. I appreciate your ongoing commitment to your health goals. Please review the following plan we discussed and let me know if I can assist you in the future.   These are the goals we discussed:  Goals      Quit Smoking        This is a list of the screening recommended for you and due dates:  Health Maintenance  Topic Date Due   COVID-19 Vaccine (3 - Moderna risk series) 02/10/2023*   Medicare Annual Wellness Visit  02/10/2023   DTaP/Tdap/Td vaccine (3 - Td or Tdap) 06/14/2027   Flu Shot  Completed   Hepatitis C Screening: USPSTF Recommendation to screen - Ages 18-79 yo.  Completed   HIV Screening  Completed   HPV Vaccine  Aged Out  *Topic was postponed. The date shown is not the original due date.    Advanced directives: Will mail to patient. He will fill out have notarized and return to our office  Conditions/risks identified: Fall risk as he uses rugs, make sure to tape in place if using and be mindful of effects of pain medications.   Next appointment: Follow up in one year for your annual wellness visit 02/12/23 at [place] 19-12 Years Old, Male Preventive care refers to lifestyle choices and visits with your health care provider that can promote health and wellness. Preventive care visits are also called wellness exams. What can I expect for my preventive care visit? Counseling During your preventive care visit, your health care provider may ask about your: Medical history, including: Past medical problems. Family medical history. Current health, including: Emotional well-being. Home life and relationship well-being. Sexual activity. Lifestyle, including: Alcohol, nicotine or tobacco, and drug use. Access to firearms. Diet, exercise, and sleep habits. Safety issues such as seatbelt and bike helmet use. Sunscreen use. Work and work Statistician. Physical exam Your health  care provider may check your: Height and weight. These may be used to calculate your BMI (body mass index). BMI is a measurement that tells if you are at a healthy weight. Waist circumference. This measures the distance around your waistline. This measurement also tells if you are at a healthy weight and may help predict your risk of certain diseases, such as type 2 diabetes and high blood pressure. Heart rate and blood pressure. Body temperature. Skin for abnormal spots. What immunizations do I need? Vaccines are usually given at various ages, according to a schedule. Your health care provider will recommend vaccines for you based on your age, medical history, and lifestyle or other factors, such as travel or where you work. What tests do I need? Screening Your health care provider may recommend screening tests for certain conditions. This may include: Lipid and cholesterol levels. Diabetes screening. This is done by checking your blood sugar (glucose) after you have not eaten for a while (fasting). Hepatitis B test. Hepatitis C test. HIV (human immunodeficiency virus) test. STI (sexually transmitted infection) testing, if you are at risk. Talk with your health care provider about your test results, treatment options, and if necessary, the need for more tests. Follow these instructions at home: Eating and drinking  Eat a healthy diet that includes fresh fruits and vegetables, whole grains, lean protein, and low-fat dairy products. Drink enough fluid to keep your urine pale yellow. Take vitamin and mineral supplements as recommended by your health care provider. Do not drink alcohol  if your health care provider tells you not to drink. If you drink alcohol: Limit how much you have to 0-2 drinks a day. Know how much alcohol is in your drink. In the [place] one drink equals one 12 oz bottle of beer (355 mL), one 5 oz glass of wine (148 mL), or one 1 oz glass of hard liquor (44  mL). Lifestyle Brush your teeth every morning and night with fluoride toothpaste. Floss one time each day. Exercise for at least 30 minutes 5 or more days each week. Do not use any products that contain nicotine or tobacco. These products include cigarettes, chewing tobacco, and vaping devices, such as e-cigarettes. If you need help quitting, ask your health care provider. Do not use drugs. If you are sexually active, practice safe sex. Use a condom or other form of protection to prevent STIs. Find healthy ways to manage stress, such as: Meditation, yoga, or listening to music. Journaling. Talking to a trusted person. Spending time with friends and family. Minimize exposure to UV radiation to reduce your risk of skin cancer. Safety Always wear your seat belt while driving or riding in a vehicle. Do not drive: If you have been drinking alcohol. Do not ride with someone who has been drinking. If you have been using any mind-altering substances or drugs. While texting. When you are tired or distracted. Wear a helmet and other protective equipment during sports activities. If you have firearms in your house, make sure you follow all gun safety procedures. Seek help if you have been physically or sexually abused. What's next? Go to your health care provider once a year for an annual wellness visit. Ask your health care provider how often you should have your eyes and teeth checked. Stay up to date on all vaccines. This information is not intended to replace advice given to you by your health care provider. Make sure you discuss any questions you have with your health care provider. Document Revised: 07/28/2020 Document Reviewed: 07/28/2020 Elsevier Patient Education  [place].

## 2022-02-21 ENCOUNTER — Telehealth (HOSPITAL_COMMUNITY): Payer: Self-pay

## 2022-02-21 ENCOUNTER — Telehealth: Payer: Self-pay | Admitting: Family Medicine

## 2022-02-21 ENCOUNTER — Non-Acute Institutional Stay (HOSPITAL_COMMUNITY)
Admission: RE | Admit: 2022-02-21 | Discharge: 2022-02-21 | Disposition: A | Discharge status: Home / Self Care | Payer: Medicare Other | Source: Ambulatory Visit | Attending: Internal Medicine | Admitting: Internal Medicine

## 2022-02-21 ENCOUNTER — Other Ambulatory Visit: Payer: Self-pay | Admitting: Family Medicine

## 2022-02-21 ENCOUNTER — Other Ambulatory Visit (HOSPITAL_COMMUNITY): Payer: Self-pay

## 2022-02-21 DIAGNOSIS — D571 Sickle-cell disease without crisis: Secondary | ICD-10-CM | POA: Insufficient documentation

## 2022-02-21 DIAGNOSIS — G894 Chronic pain syndrome: Secondary | ICD-10-CM

## 2022-02-21 LAB — CBC WITH DIFFERENTIAL/PLATELET
Abs Immature Granulocytes: 0.02 10*3/uL (ref 0.00–0.07)
Basophils Absolute: 0 10*3/uL (ref 0.0–0.1)
Basophils Relative: 0 %
Eosinophils Absolute: 0.2 10*3/uL (ref 0.0–0.5)
Eosinophils Relative: 2 %
HCT: 25.8 % — ABNORMAL LOW (ref 39.0–52.0)
Hemoglobin: 9.3 g/dL — ABNORMAL LOW (ref 13.0–17.0)
Immature Granulocytes: 0 %
Lymphocytes Relative: 55 %
Lymphs Abs: 5.8 10*3/uL — ABNORMAL HIGH (ref 0.7–4.0)
MCH: 42.9 pg — ABNORMAL HIGH (ref 26.0–34.0)
MCHC: 36 g/dL (ref 30.0–36.0)
MCV: 118.9 fL — ABNORMAL HIGH (ref 80.0–100.0)
Monocytes Absolute: 0.7 10*3/uL (ref 0.1–1.0)
Monocytes Relative: 6 %
Neutro Abs: 3.9 10*3/uL (ref 1.7–7.7)
Neutrophils Relative %: 37 %
Platelets: 139 10*3/uL — ABNORMAL LOW (ref 150–400)
RBC: 2.17 MIL/uL — ABNORMAL LOW (ref 4.22–5.81)
RDW: 19.3 % — ABNORMAL HIGH (ref 11.5–15.5)
WBC: 10.6 10*3/uL — ABNORMAL HIGH (ref 4.0–10.5)
nRBC: 0 % (ref 0.0–0.2)

## 2022-02-21 LAB — COMPREHENSIVE METABOLIC PANEL
ALT: 53 U/L — ABNORMAL HIGH (ref 0–44)
AST: 50 U/L — ABNORMAL HIGH (ref 15–41)
Albumin: 3.5 g/dL (ref 3.5–5.0)
Alkaline Phosphatase: 162 U/L — ABNORMAL HIGH (ref 38–126)
Anion gap: 11 (ref 5–15)
BUN: 53 mg/dL — ABNORMAL HIGH (ref 6–20)
CO2: 23 mmol/L (ref 22–32)
Calcium: 8.6 mg/dL — ABNORMAL LOW (ref 8.9–10.3)
Chloride: 105 mmol/L (ref 98–111)
Creatinine, Ser: 3.73 mg/dL — ABNORMAL HIGH (ref 0.61–1.24)
GFR, Estimated: 20 mL/min — ABNORMAL LOW (ref >60)
Glucose, Bld: 133 mg/dL — ABNORMAL HIGH (ref 70–99)
Potassium: 5.4 mmol/L — ABNORMAL HIGH (ref 3.5–5.1)
Sodium: 139 mmol/L (ref 135–145)
Total Bilirubin: 0.9 mg/dL (ref 0.3–1.2)
Total Protein: 7.3 g/dL (ref 6.5–8.1)

## 2022-02-21 LAB — RETICULOCYTES
Immature Retic Fract: 22 % — ABNORMAL HIGH (ref 2.3–15.9)
RBC.: 2.16 MIL/uL — ABNORMAL LOW (ref 4.22–5.81)
Retic Count, Absolute: 92.2 10*3/uL (ref 19.0–186.0)
Retic Ct Pct: 4.3 % — ABNORMAL HIGH (ref 0.4–3.1)

## 2022-02-21 LAB — LACTATE DEHYDROGENASE: LDH: 145 U/L (ref 98–192)

## 2022-02-21 NOTE — Progress Notes (Addendum)
PATIENT CARE CENTER:  Provider:  [place] Hollis FNP   Procedure: lab draw ( CBC, CMP, LDH, Reticulocytes)   Pt seen in the [facility] for lab draw. Labs collected peripherally by phlebotomist per orders without incident. Pt states his nephrologist advised that pt have fluid removed and tested from knee due to frequent swelling, and he wants to speak with provider about a referral to have this done. [place] FNP notified, instructed pt to schedule a virtual PCP visit to discuss this issue, pt made aware and verbalized understanding. Pt alert, oriented ,and ambulatory at discharge.

## 2022-02-21 NOTE — Telephone Encounter (Signed)
Per [place] Hollis, FNP pt can come to day hospital tomorrow. RN called pt he can come to day hospital tomorrow at 8 AM. Pt verbalized understanding.

## 2022-02-22 ENCOUNTER — Non-Acute Institutional Stay (HOSPITAL_COMMUNITY)
Admission: AD | Admit: 2022-02-22 | Discharge: 2022-02-22 | Disposition: A | Discharge status: Home / Self Care | Payer: Medicare Other | Source: Ambulatory Visit | Attending: Internal Medicine | Admitting: Internal Medicine

## 2022-02-22 DIAGNOSIS — F1120 Opioid dependence, uncomplicated: Secondary | ICD-10-CM | POA: Diagnosis not present

## 2022-02-22 DIAGNOSIS — D5700 Hb-SS disease with crisis, unspecified: Secondary | ICD-10-CM

## 2022-02-22 DIAGNOSIS — D638 Anemia in other chronic diseases classified elsewhere: Secondary | ICD-10-CM | POA: Diagnosis not present

## 2022-02-22 DIAGNOSIS — G894 Chronic pain syndrome: Secondary | ICD-10-CM | POA: Diagnosis not present

## 2022-02-22 LAB — CBC
HCT: 24.6 % — ABNORMAL LOW (ref 39.0–52.0)
Hemoglobin: 8.7 g/dL — ABNORMAL LOW (ref 13.0–17.0)
MCH: 42 pg — ABNORMAL HIGH (ref 26.0–34.0)
MCHC: 35.4 g/dL (ref 30.0–36.0)
MCV: 118.8 fL — ABNORMAL HIGH (ref 80.0–100.0)
Platelets: 138 10*3/uL — ABNORMAL LOW (ref 150–400)
RBC: 2.07 MIL/uL — ABNORMAL LOW (ref 4.22–5.81)
RDW: 18.6 % — ABNORMAL HIGH (ref 11.5–15.5)
WBC: 10.1 10*3/uL (ref 4.0–10.5)
nRBC: 0 % (ref 0.0–0.2)

## 2022-02-22 LAB — BASIC METABOLIC PANEL
Anion gap: 9 (ref 5–15)
BUN: 48 mg/dL — ABNORMAL HIGH (ref 6–20)
CO2: 20 mmol/L — ABNORMAL LOW (ref 22–32)
Calcium: 8.6 mg/dL — ABNORMAL LOW (ref 8.9–10.3)
Chloride: 107 mmol/L (ref 98–111)
Creatinine, Ser: 3.47 mg/dL — ABNORMAL HIGH (ref 0.61–1.24)
GFR, Estimated: 22 mL/min — ABNORMAL LOW (ref >60)
Glucose, Bld: 150 mg/dL — ABNORMAL HIGH (ref 70–99)
Potassium: 4.5 mmol/L (ref 3.5–5.1)
Sodium: 136 mmol/L (ref 135–145)

## 2022-02-22 MED ORDER — POLYETHYLENE GLYCOL 3350 17 G PO PACK: 17.0000 g | PACK | Freq: Every day | ORAL | Status: DC | PRN

## 2022-02-22 MED ORDER — ONDANSETRON HCL 4 MG/2ML IJ SOLN: 4.0000 mg | Freq: Four times a day (QID) | INTRAMUSCULAR | Status: DC | PRN

## 2022-02-22 MED ORDER — SENNOSIDES-DOCUSATE SODIUM 8.6-50 MG PO TABS: 1.0000 | ORAL_TABLET | Freq: Two times a day (BID) | ORAL | Status: DC

## 2022-02-22 MED ORDER — ACETAMINOPHEN 500 MG PO TABS
1000.0000 mg | ORAL_TABLET | Freq: Once | ORAL | Status: AC
  Administered 2022-02-22: 1000 mg via ORAL
  Filled 2022-02-22: qty 2

## 2022-02-22 MED ORDER — HYDROMORPHONE 1 MG/ML IV SOLN
INTRAVENOUS | Status: DC
  Administered 2022-02-22: 6 mg via INTRAVENOUS
  Administered 2022-02-22: 30 mg via INTRAVENOUS
  Filled 2022-02-22: qty 30

## 2022-02-22 MED ORDER — SODIUM CHLORIDE 0.9% FLUSH: 9.0000 mL | INTRAVENOUS | Status: DC | PRN

## 2022-02-22 MED ORDER — SODIUM CHLORIDE 0.45 % IV SOLN: INTRAVENOUS | Status: DC

## 2022-02-22 MED ORDER — NALOXONE HCL 0.4 MG/ML IJ SOLN: 0.4000 mg | INTRAMUSCULAR | Status: DC | PRN

## 2022-02-22 MED ORDER — DIPHENHYDRAMINE HCL 25 MG PO CAPS
25.0000 mg | ORAL_CAPSULE | ORAL | Status: DC | PRN
  Administered 2022-02-22: 25 mg via ORAL
  Filled 2022-02-22: qty 1

## 2022-02-22 NOTE — H&P (Signed)
[facility] History and Physical   Date: 02/22/2022  Patient name: [name] [name]. Medical record number: [id number] Date of birth: [birth year] Age: 42 y.o. Gender: male PCP: [name], FNP  Attending physician: No att. providers found  Chief Complaint: Sickle cell pain    History of present illness:  [name] [name] is a 42 year old male with a medical history significant for sickle cell disease, chronic pain syndrome, opiate dependence and tolerance, stage IV chronic kidney disease, and anemia of chronic disease presents with complaints of bilateral knee pain. Patient reports periodic knee pain and swelling over the past several weeks. Patient was previously under the care of [facility], but has been lost to follow-up over the past 6 months.  Today, patient's pain intensity is 8/10, constant and aching. Patient endorses swelling. He denies any recent falls or injuries. He denies fever, chills, chest pain, shortness of breath, or dizziness. No nausea, vomiting, or diarrhea. No recent travel, sick contacts, or exposure to COVID-19.   Meds: No medications prior to admission.    Allergies: Nsaids and Morphine and related Past Medical History:  Diagnosis Date   Abscess of right iliac fossa 09/24/2018   required Perc Drain 09/24/2018   Arachnoid Cyst of brain bilaterally    "2 really small ones in the back of my head; inside; saw them w/MRI" (09/25/2012)   Bacterial pneumonia ~ 2012   "caught it here in the hospital" (09/25/2012)   Chronic kidney disease    "from my sickle cell" (09/25/2012)   CKD (chronic kidney disease), stage II    Colitis 04/19/2017   CT scan abd/ pelvis   GERD (gastroesophageal reflux disease)    "after I eat alot of spicey foods" (09/25/2012)   Gynecomastia, male 07/10/2012   History of blood transfusion    "always related to sickle cell crisis" (09/25/2012)   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   Noted in noted from  Hematology notes at [facility]    "take RX qd to prevent them" (09/25/2012)   Opioid dependence with withdrawal ([place]) 08/30/2012   Renal insufficiency    Sickle cell anemia (HCC)    Sickle cell crisis ([place]) 09/25/2012   Sickle cell nephropathy ([place]) 07/10/2012   Sinus tachycardia    Tachycardia with heart rate 121-140 beats per minute with ambulation 08/04/2016   Past Surgical History:  Procedure Laterality Date   ABCESS DRAINAGE     CHOLECYSTECTOMY  ~ 2012   COLONOSCOPY N/A 04/23/2017   Procedure: COLONOSCOPY;  Surgeon: Irene Shipper, MD;  Location: [facility];  Service: Endoscopy;  Laterality: N/A;   IR FLUORO GUIDE CV LINE RIGHT  12/17/2016   IR FLUORO GUIDE CV LINE RIGHT  02/07/2021   IR RADIOLOGIST EVAL & MGMT  10/02/2018   IR RADIOLOGIST EVAL & MGMT  10/15/2018   IR REMOVAL TUN CV CATH W/O FL  12/21/2016   IR REMOVAL TUN CV CATH W/O FL  02/11/2021   IR US GUIDE VASC ACCESS RIGHT  12/17/2016   IR US GUIDE VASC ACCESS RIGHT  02/07/2021   spleenectomy     Family History  Problem Relation Age of Onset   Breast cancer Mother    Social History   Socioeconomic History   Marital status: Single    Spouse name: Not on file   Number of children: Not on file   Years of education: Not on file   Highest education level: Not on file  Occupational History  Not on file  Tobacco Use   Smoking status: Some Days    Packs/day: 0.10    Types: Cigarettes   Smokeless tobacco: Never  Vaping Use   Vaping Use: Never used  Substance and Sexual Activity   Alcohol use: Yes    Comment: Once a month   Drug use: Yes    Types: Marijuana    Comment: Every 2-3 weeks    Sexual activity: Yes    Birth control/protection: Condom  Other Topics Concern   Not on file  Social History Narrative    Lives alone in [place].   Back at school, studying Public relations account executive. Unemployed.    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    Social Determinants of Health   Financial Resource Strain: Low Risk  (02/09/2022)   Overall Financial Resource Strain (CARDIA)    Difficulty of Paying Living Expenses: Not hard at all  Food Insecurity: No Food Insecurity (02/09/2022)   Hunger Vital Sign    Worried About Running Out of Food in the Last Year: Never true    Ran Out of Food in the Last Year: Never true  Transportation Needs: No Transportation Needs (02/09/2022)   PRAPARE - Hydrologist (Medical): No    Lack of Transportation (Non-Medical): No  Recent Concern: Transportation Needs - Unmet Transportation Needs (12/27/2021)   PRAPARE - Transportation    Lack of Transportation (Medical): Yes    Lack of Transportation (Non-Medical): Yes  Physical Activity: Sufficiently Active (02/09/2022)   Exercise Vital Sign    Days of Exercise per Week: 7 days    Minutes of Exercise per Session: 30 min  Stress: No Stress Concern Present (02/09/2022)   [place]    Feeling of Stress : Not at all  Social Connections: Socially Isolated (02/09/2022)   Social Connection and Isolation Panel [NHANES]    Frequency of Communication with Friends and Family: More than three times a week    Frequency of Social Gatherings with Friends and Family: Once a week    Attends Religious Services: Never    Marine scientist or Organizations: No    Attends Archivist Meetings: Never    Marital Status: Divorced  Human resources officer Violence: Not At Risk (02/09/2022)   Humiliation, Afraid, Rape, and Kick questionnaire    Fear of Current or Ex-Partner: No    Emotionally Abused: No    Physically Abused: No    Sexually Abused: No   Review of Systems  Constitutional: Negative.   HENT: Negative.    Eyes: Negative.   Respiratory: Negative.    Cardiovascular:  Negative.   Gastrointestinal: Negative.   Genitourinary: Negative.   Musculoskeletal:  Positive for back pain and joint pain.  Skin: Negative.   Neurological: Negative.   Endo/Heme/Allergies: Negative.   Psychiatric/Behavioral: Negative.      Physical Exam: Blood pressure 105/64, pulse 83, temperature 97.6 F (36.4 C), temperature source Temporal, resp. rate 12, SpO2 98 %. Physical Exam Constitutional:      Appearance: Normal appearance.  Eyes:     Pupils: Pupils are equal, round, and reactive to light.  Cardiovascular:     Rate and Rhythm: Normal rate and regular rhythm.     Pulses: Normal pulses.  Pulmonary:     Effort:  Pulmonary effort is normal.  Abdominal:     General: Bowel sounds are normal.  Musculoskeletal:     Right knee: Swelling and effusion present. Decreased range of motion.     Left knee: Swelling present. Decreased range of motion.  Skin:    General: Skin is warm.  Neurological:     General: No focal deficit present.     Mental Status: He is alert. Mental status is at baseline.      Lab results: Results for orders placed or performed during the hospital encounter of 02/22/22 (from the past 24 hour(s))  Basic metabolic panel     Status: Abnormal   Collection Time: 02/22/22 10:00 AM  Result Value Ref Range   Sodium 136 135 - 145 mmol/L   Potassium 4.5 3.5 - 5.1 mmol/L   Chloride 107 98 - 111 mmol/L   CO2 20 (L) 22 - 32 mmol/L   Glucose, Bld 150 (H) 70 - 99 mg/dL   BUN 48 (H) 6 - 20 mg/dL   Creatinine, Ser 3.47 (H) 0.61 - 1.24 mg/dL   Calcium 8.6 (L) 8.9 - 10.3 mg/dL   GFR, Estimated 22 (L) >60 mL/min   Anion gap 9 5 - 15  CBC     Status: Abnormal   Collection Time: 02/22/22 10:00 AM  Result Value Ref Range   WBC 10.1 4.0 - 10.5 K/uL   RBC 2.07 (L) 4.22 - 5.81 MIL/uL   Hemoglobin 8.7 (L) 13.0 - 17.0 g/dL   HCT 24.6 (L) 39.0 - 52.0 %   MCV 118.8 (H) 80.0 - 100.0 fL   MCH 42.0 (H) 26.0 - 34.0 pg   MCHC 35.4 30.0 - 36.0 g/dL   RDW 18.6 (H) 11.5  - 15.5 %   Platelets 138 (L) 150 - 400 K/uL   nRBC 0.0 0.0 - 0.2 %   *Note: Due to a large number of results and/or encounters for the requested time period, some results have not been displayed. A complete set of results can be found in Results Review.    Imaging results:  No results found.   Assessment & Plan: Patient admitted to sickle cell day infusion center for management of pain crisis.  Patient is opiate tolerant Initiate IV dilaudid PCA IV fluids, 0.45% saline at 100 ml/hr Tylenol 1000 mg by mouth times one dose Review CBC with differential, complete metabolic panel, and reticulocytes as results become available. Baseline hemoglobin is Pain intensity will be reevaluated in context of functioning and relationship to baseline as care progresses If pain intensity remains elevated and/or sudden change in hemodynamic stability transition to inpatient services for higher level of care.      [name]  APRN, MSN, FNP-C [facility] [address] [place], [place] [address] [phone]  02/22/2022, 4:22 PM

## 2022-02-22 NOTE — Progress Notes (Signed)
Pt admitted to day hospital today for sickle cell pain. On arrival, pt reports 10/10 pain to left knee. For pain pt received Dilaudid PCA and was hydrated with IV fluids via PIV. Pt also received 25 mg PO Benadryl and 1,000 mg PO Tylenol. At discharge, pt reports 5/10 pain. AVS given to pt. Pt is alert, oriented, and ambulatory at discharge.

## 2022-02-22 NOTE — Discharge Summary (Signed)
[name]. MRN: [id number] DOB/AGE: 40-Apr-1982 40 y.o.  Admit date: 02/22/2022 Discharge date: 02/22/2022  Primary Care Physician:  [name], FNP  Admission Diagnoses:  Active Problems:   Sickle cell pain crisis [facility])  Discharge Medications:  Allergies as of 02/22/2022       Reactions   Nsaids Other (See Comments)   Kidney disease   Morphine And Related Other (See Comments)   "Real bad headaches"         Medication List     TAKE these medications    amitriptyline 25 MG tablet Commonly known as: ELAVIL Take 50 mg by mouth at bedtime.   calcitRIOL 0.25 MCG capsule Commonly known as: ROCALTROL Take 0.25 mcg by mouth daily with breakfast.   Ferriprox Twice-A-Day 1000 MG Tabs Generic drug: Deferiprone (Twice Daily) Take 3,000 mg by mouth daily.   folic acid 1 MG tablet Commonly known as: FOLVITE TAKE 1 TABLET BY MOUTH EVERY DAY What changed: when to take this   hydroxyurea 500 MG capsule Commonly known as: HYDREA Take 1-2 capsules (500-1,000 mg total) by mouth See admin instructions. Take 500 mg by mouth in the morning on Sun/Tues/Wed/Thurs/Sat and 1,000 mg on Mon/Fri   Oxbryta 500 MG Tabs tablet Generic drug: voxelotor Take 1,500 mg by mouth daily with breakfast.   oxyCODONE 15 mg 12 hr tablet Commonly known as: OXYCONTIN Take 1 tablet (15 mg total) by mouth every 12 (twelve) hours.   Oxycodone HCl 10 MG Tabs Take 1 tablet by mouth every 6 hours as needed (pain).   sodium bicarbonate 650 MG tablet Take 1,950 mg by mouth with breakfast, with lunch, and with evening meal.   Tylenol 8 Hour Arthritis Pain 650 MG CR tablet Generic drug: acetaminophen Take 650 mg by mouth every 8 (eight) hours as needed for pain.   Veltassa 16.8 g Pack Generic drug: Patiromer Sorbitex Calcium Take 16.8 g by mouth daily.         Consults:  None  Significant Diagnostic Studies:  No  results found.  History of present illness:  [name] [name] is a 42 year old male with a medical history significant for sickle cell disease, chronic pain syndrome, opiate dependence and tolerance, stage IV chronic kidney disease, and anemia of chronic disease presents with complaints of bilateral knee pain. Patient reports periodic knee pain and swelling over the past several weeks. Patient was previously under the care of [facility], but has been lost to follow-up over the past 6 months.  Today, patient's pain intensity is 8/10, constant and aching. Patient endorses swelling. He denies any recent falls or injuries. He denies fever, chills, chest pain, shortness of breath, or dizziness. No nausea, vomiting, or diarrhea. No recent travel, sick contacts, or exposure to COVID-19.    [facility] Course: Patient admitted to sickle cell day infusion clinic for management of sickle cell pain crisis.  Patient's hemoglobin is stable and consistent with his baseline.  Patient's creatinine is 3.47, which is consistent with his baseline. He is followed by nephrology as an outpatient.  Pain managed with IV dilaudid PCA Tylenol 1000 mgx1 IV fluids, 0.45% saline at 100 ml/hr Pain intensity decreased to 5/10. Patient is requesting discharge home.  He is alert, oriented, and ambulating without assistance.  Patient will be discharged home in a hemodynamically stable condition.    Discharge instructions:  Resume all home medications.   Follow up with  PCP as previously  scheduled.   Discussed the importance of drinking 64 ounces of water daily, dehydration of red blood cells may lead further sickling.   Avoid all stressors that precipitate sickle cell pain crisis.     The patient was given clear instructions to go to ER or return to medical center if symptoms do not improve, worsen or new problems develop.   Physical Exam at Discharge:  BP 105/64 (BP Location: Right Arm)   Pulse  79   Temp 97.6 F (36.4 C) (Temporal)   Resp 16   SpO2 99%  Physical Exam Constitutional:      Appearance: Normal appearance.  Eyes:     Pupils: Pupils are equal, round, and reactive to light.  Cardiovascular:     Rate and Rhythm: Normal rate and regular rhythm.     Pulses: Normal pulses.  Pulmonary:     Effort: Pulmonary effort is normal.  Abdominal:     General: Bowel sounds are normal.  Musculoskeletal:     Right knee: Swelling and effusion present. No erythema. Decreased range of motion. Tenderness present.     Left knee: Swelling present. No erythema.  Skin:    General: Skin is warm.  Neurological:     General: No focal deficit present.     Mental Status: He is alert. Mental status is at baseline.  Psychiatric:        Mood and Affect: Mood normal.        Thought Content: Thought content normal.        Judgment: Judgment normal.     Disposition at Discharge: Discharge disposition: 01-Home or Self Care       Discharge Orders: Discharge Instructions     Discharge patient   Complete by: As directed    Discharge disposition: 01-Home or Self Care   Discharge patient date: 02/22/2022       Condition at Discharge:   Stable  Time spent on Discharge:  Greater than 30 minutes.  Signed: [name]  APRN, MSN, FNP-C [facility] [address] [place], [place] [address] [phone]  02/22/2022, 3:34 PM

## 2022-02-28 ENCOUNTER — Encounter (HOSPITAL_COMMUNITY): Payer: Medicare Other

## 2022-03-06 ENCOUNTER — Other Ambulatory Visit (HOSPITAL_COMMUNITY): Payer: Self-pay

## 2022-03-06 ENCOUNTER — Other Ambulatory Visit: Payer: Self-pay | Admitting: Family Medicine

## 2022-03-06 ENCOUNTER — Other Ambulatory Visit: Payer: Self-pay

## 2022-03-06 DIAGNOSIS — G894 Chronic pain syndrome: Secondary | ICD-10-CM

## 2022-03-06 DIAGNOSIS — D571 Sickle-cell disease without crisis: Secondary | ICD-10-CM

## 2022-03-06 MED ORDER — OXYCODONE HCL 10 MG PO TABS
10.0000 mg | ORAL_TABLET | Freq: Four times a day (QID) | ORAL | 0 refills | Status: DC | PRN
  Filled 2022-03-06: qty 60, 15d supply, fill #0

## 2022-03-06 NOTE — Progress Notes (Signed)
Reviewed PDMP substance reporting system prior to prescribing opiate medications. No inconsistencies noted.  Meds ordered this encounter  Medications   Oxycodone HCl 10 MG TABS    Sig: Take 1 tablet by mouth every 6 hours as needed (pain).    Dispense:  60 tablet    Refill:  0    Order Specific Question:   Supervising Provider    Answer:   [name] [[id number]]   Oxycontin cannot be filled prior to 2//07/2022  [name]  APRN, MSN, FNP-C [facility] [address] [place], [place] [address] [phone]

## 2022-03-06 NOTE — Telephone Encounter (Signed)
Caller & Relationship to patient:  MRN #  [id number]   Call Back Number:   Date of Last Office Visit: 01/23/2022     Date of Next Office Visit: 02/21/2022    Medication(s) to be Refilled: Oxy Contin  Preferred Pharmacy:   ** Please notify patient to allow 48-72 hours to process** **Let patient know to contact pharmacy at the end of the day to make sure medication is ready. ** **If patient has not been seen in a year or longer, book an appointment **Advise to use MyChart for refill requests OR to contact their pharmacy

## 2022-03-06 NOTE — Telephone Encounter (Signed)
From: [name]. To: Office of Angelica Chessman, MD Sent: 03/06/2022 12:21 PM EST Subject: Medication Renewal Request  Refills have been requested for the following medications:   oxyCODONE (OXYCONTIN) 15 mg 12 hr tablet [Olugbemiga [name]]   Oxycodone HCl 10 MG TABS [Olugbemiga [name]]  Preferred pharmacy: [facility] [place], [place] - [address] AT [address] Delivery method: Arlyss Gandy

## 2022-03-07 ENCOUNTER — Non-Acute Institutional Stay (HOSPITAL_COMMUNITY)
Admission: RE | Admit: 2022-03-07 | Discharge: 2022-03-07 | Disposition: A | Discharge status: Home / Self Care | Payer: Medicare Other | Source: Ambulatory Visit | Attending: Internal Medicine | Admitting: Internal Medicine

## 2022-03-07 ENCOUNTER — Other Ambulatory Visit: Payer: Self-pay | Admitting: Family Medicine

## 2022-03-07 DIAGNOSIS — D571 Sickle-cell disease without crisis: Secondary | ICD-10-CM

## 2022-03-07 LAB — COMPREHENSIVE METABOLIC PANEL
ALT: 44 U/L (ref 0–44)
AST: 63 U/L — ABNORMAL HIGH (ref 15–41)
Albumin: 3.4 g/dL — ABNORMAL LOW (ref 3.5–5.0)
Alkaline Phosphatase: 164 U/L — ABNORMAL HIGH (ref 38–126)
Anion gap: 14 (ref 5–15)
BUN: 33 mg/dL — ABNORMAL HIGH (ref 6–20)
CO2: 23 mmol/L (ref 22–32)
Calcium: 8.5 mg/dL — ABNORMAL LOW (ref 8.9–10.3)
Chloride: 102 mmol/L (ref 98–111)
Creatinine, Ser: 3.62 mg/dL — ABNORMAL HIGH (ref 0.61–1.24)
GFR, Estimated: 21 mL/min — ABNORMAL LOW (ref >60)
Glucose, Bld: 125 mg/dL — ABNORMAL HIGH (ref 70–99)
Potassium: 4.1 mmol/L (ref 3.5–5.1)
Sodium: 139 mmol/L (ref 135–145)
Total Bilirubin: 1 mg/dL (ref 0.3–1.2)
Total Protein: 7.2 g/dL (ref 6.5–8.1)

## 2022-03-07 LAB — CBC WITH DIFFERENTIAL/PLATELET
Abs Immature Granulocytes: 0.02 10*3/uL (ref 0.00–0.07)
Basophils Absolute: 0 10*3/uL (ref 0.0–0.1)
Basophils Relative: 0 %
Eosinophils Absolute: 0.1 10*3/uL (ref 0.0–0.5)
Eosinophils Relative: 1 %
HCT: 25.4 % — ABNORMAL LOW (ref 39.0–52.0)
Hemoglobin: 8.9 g/dL — ABNORMAL LOW (ref 13.0–17.0)
Immature Granulocytes: 0 %
Lymphocytes Relative: 41 %
Lymphs Abs: 3.8 10*3/uL (ref 0.7–4.0)
MCH: 42.8 pg — ABNORMAL HIGH (ref 26.0–34.0)
MCHC: 35 g/dL (ref 30.0–36.0)
MCV: 122.1 fL — ABNORMAL HIGH (ref 80.0–100.0)
Monocytes Absolute: 0.5 10*3/uL (ref 0.1–1.0)
Monocytes Relative: 6 %
Neutro Abs: 4.8 10*3/uL (ref 1.7–7.7)
Neutrophils Relative %: 52 %
Platelets: 290 10*3/uL (ref 150–400)
RBC: 2.08 MIL/uL — ABNORMAL LOW (ref 4.22–5.81)
RDW: 18.2 % — ABNORMAL HIGH (ref 11.5–15.5)
WBC: 9.2 10*3/uL (ref 4.0–10.5)
nRBC: 0 % (ref 0.0–0.2)

## 2022-03-07 LAB — RETICULOCYTES
Immature Retic Fract: 28.6 % — ABNORMAL HIGH (ref 2.3–15.9)
RBC.: 2.05 MIL/uL — ABNORMAL LOW (ref 4.22–5.81)
Retic Count, Absolute: 65.4 10*3/uL (ref 19.0–186.0)
Retic Ct Pct: 3.2 % — ABNORMAL HIGH (ref 0.4–3.1)

## 2022-03-07 NOTE — Progress Notes (Signed)
PATIENT CARE CENTER NOTE  Provider: [place] Hollis, FNP  Procedure: Lab draw (CBC, CMP, Reticulocytes)  Note: Pt seen in [facility] today for lab draw. Labs drawn peripherally by phlebotomist. Pt alert, oriented, and ambulatory at discharge.

## 2022-03-07 NOTE — Telephone Encounter (Signed)
Pt was advised [facility] 

## 2022-03-09 NOTE — Telephone Encounter (Signed)
Pt called requesting to fill his oxycontin 15 mg to [facility]. Please advise [facility]

## 2022-03-10 ENCOUNTER — Other Ambulatory Visit (HOSPITAL_COMMUNITY): Payer: Self-pay

## 2022-03-17 ENCOUNTER — Other Ambulatory Visit: Payer: Self-pay | Admitting: Family Medicine

## 2022-03-17 ENCOUNTER — Other Ambulatory Visit (HOSPITAL_COMMUNITY): Payer: Self-pay

## 2022-03-17 DIAGNOSIS — G894 Chronic pain syndrome: Secondary | ICD-10-CM

## 2022-03-17 DIAGNOSIS — D571 Sickle-cell disease without crisis: Secondary | ICD-10-CM

## 2022-03-17 MED ORDER — OXYCODONE HCL ER 15 MG PO T12A
15.0000 mg | EXTENDED_RELEASE_TABLET | Freq: Two times a day (BID) | ORAL | 0 refills | Status: DC
  Filled 2022-03-21: qty 60, 30d supply, fill #0

## 2022-03-17 NOTE — Progress Notes (Signed)
Reviewed PDMP substance reporting system prior to prescribing opiate medications. No inconsistencies noted.  Meds ordered this encounter  Medications   oxyCODONE (OXYCONTIN) 15 mg 12 hr tablet    Sig: Take 1 tablet (15 mg total) by mouth every 12 (twelve) hours.    Dispense:  60 tablet    Refill:  0    Order Specific Question:   Supervising Provider    Answer:   [name], [name] [[id number]]   [name] Teehan Moore Bryley Kovacevic  APRN, MSN, FNP-C [facility] [address]  [place], [place] [address] [phone]  

## 2022-03-17 NOTE — Telephone Encounter (Signed)
Caller & Relationship to patient:  MRN #  [id number]   Call Back Number:   Date of Last Office Visit: 02/09/2022     Date of Next Office Visit: 03/06/2022    Medication(s) to be Refilled: Oxycontin   Preferred Pharmacy: [facility]  ** Please notify patient to allow 48-72 hours to process** **Let patient know to contact pharmacy at the end of the day to make sure medication is ready. ** **If patient has not been seen in a year or longer, book an appointment **Advise to use MyChart for refill requests OR to contact their pharmacy

## 2022-03-21 ENCOUNTER — Other Ambulatory Visit: Payer: Self-pay | Admitting: Family Medicine

## 2022-03-21 ENCOUNTER — Other Ambulatory Visit: Payer: Self-pay

## 2022-03-21 ENCOUNTER — Other Ambulatory Visit (HOSPITAL_COMMUNITY): Payer: Self-pay

## 2022-03-21 ENCOUNTER — Non-Acute Institutional Stay (HOSPITAL_COMMUNITY)
Admission: RE | Admit: 2022-03-21 | Discharge: 2022-03-21 | Disposition: A | Discharge status: Home / Self Care | Payer: Medicare Other | Source: Ambulatory Visit | Attending: Internal Medicine | Admitting: Internal Medicine

## 2022-03-21 DIAGNOSIS — D571 Sickle-cell disease without crisis: Secondary | ICD-10-CM

## 2022-03-21 DIAGNOSIS — G894 Chronic pain syndrome: Secondary | ICD-10-CM

## 2022-03-21 LAB — RETICULOCYTES
Immature Retic Fract: 25.5 % — ABNORMAL HIGH (ref 2.3–15.9)
RBC.: 1.86 MIL/uL — ABNORMAL LOW (ref 4.22–5.81)
Retic Count, Absolute: 75.9 10*3/uL (ref 19.0–186.0)
Retic Ct Pct: 4.1 % — ABNORMAL HIGH (ref 0.4–3.1)

## 2022-03-21 LAB — COMPREHENSIVE METABOLIC PANEL
ALT: 49 U/L — ABNORMAL HIGH (ref 0–44)
AST: 54 U/L — ABNORMAL HIGH (ref 15–41)
Albumin: 3.3 g/dL — ABNORMAL LOW (ref 3.5–5.0)
Alkaline Phosphatase: 134 U/L — ABNORMAL HIGH (ref 38–126)
Anion gap: 14 (ref 5–15)
BUN: 47 mg/dL — ABNORMAL HIGH (ref 6–20)
CO2: 20 mmol/L — ABNORMAL LOW (ref 22–32)
Calcium: 8.6 mg/dL — ABNORMAL LOW (ref 8.9–10.3)
Chloride: 103 mmol/L (ref 98–111)
Creatinine, Ser: 3.12 mg/dL — ABNORMAL HIGH (ref 0.61–1.24)
GFR, Estimated: 25 mL/min — ABNORMAL LOW (ref >60)
Glucose, Bld: 80 mg/dL (ref 70–99)
Potassium: 4.5 mmol/L (ref 3.5–5.1)
Sodium: 137 mmol/L (ref 135–145)
Total Bilirubin: 1 mg/dL (ref 0.3–1.2)
Total Protein: 6.8 g/dL (ref 6.5–8.1)

## 2022-03-21 LAB — CBC WITH DIFFERENTIAL/PLATELET
Abs Immature Granulocytes: 0.04 10*3/uL (ref 0.00–0.07)
Basophils Absolute: 0 10*3/uL (ref 0.0–0.1)
Basophils Relative: 0 %
Eosinophils Absolute: 0.1 10*3/uL (ref 0.0–0.5)
Eosinophils Relative: 1 %
HCT: 22.8 % — ABNORMAL LOW (ref 39.0–52.0)
Hemoglobin: 8.1 g/dL — ABNORMAL LOW (ref 13.0–17.0)
Immature Granulocytes: 0 %
Lymphocytes Relative: 59 %
Lymphs Abs: 6.9 10*3/uL — ABNORMAL HIGH (ref 0.7–4.0)
MCH: 44 pg — ABNORMAL HIGH (ref 26.0–34.0)
MCHC: 35.5 g/dL (ref 30.0–36.0)
MCV: 123.9 fL — ABNORMAL HIGH (ref 80.0–100.0)
Monocytes Absolute: 0.8 10*3/uL (ref 0.1–1.0)
Monocytes Relative: 6 %
Neutro Abs: 4.1 10*3/uL (ref 1.7–7.7)
Neutrophils Relative %: 34 %
Platelets: 144 10*3/uL — ABNORMAL LOW (ref 150–400)
RBC: 1.84 MIL/uL — ABNORMAL LOW (ref 4.22–5.81)
RDW: 18.3 % — ABNORMAL HIGH (ref 11.5–15.5)
WBC: 11.9 10*3/uL — ABNORMAL HIGH (ref 4.0–10.5)
nRBC: 0.2 % (ref 0.0–0.2)

## 2022-03-21 MED ORDER — HYDROCODONE BITARTRATE ER 20 MG PO T24A
20.0000 mg | EXTENDED_RELEASE_TABLET | Freq: Two times a day (BID) | ORAL | 0 refills | Status: DC
  Filled 2022-03-21 (×2): qty 30, 15d supply, fill #0

## 2022-03-21 MED ORDER — OXYCODONE HCL 10 MG PO TABS: 10.0000 mg | ORAL_TABLET | Freq: Four times a day (QID) | ORAL | 0 refills | Status: DC | PRN

## 2022-03-21 MED ORDER — OXYCODONE HCL 10 MG PO TABS
10.0000 mg | ORAL_TABLET | Freq: Four times a day (QID) | ORAL | 0 refills | Status: DC | PRN
  Filled 2022-03-21: qty 60, 15d supply, fill #0

## 2022-03-21 NOTE — Progress Notes (Signed)
Reviewed PDMP substance reporting system prior to prescribing opiate medications. No inconsistencies noted.  Meds ordered this encounter  Medications   Oxycodone HCl 10 MG TABS    Sig: Take 1 tablet by mouth every 6 hours as needed (pain).    Dispense:  60 tablet    Refill:  0    Order Specific Question:   Supervising Provider    Answer:   [name] [[id number]]   HYDROcodone Bitartrate ER (HYSINGLA ER) 20 MG T24A    Sig: Take 20 mg by mouth every 12 (twelve) hours.    Dispense:  30 tablet    Refill:  0    Order Specific Question:   Supervising Provider    Answer:   [name] [[id number]]   [name]  APRN, MSN, FNP-C [facility] [address] [place], [place] [address] [phone]

## 2022-03-21 NOTE — Telephone Encounter (Signed)
Pt called and stated that he just spoke with [facility] and if you can send him the regular med's    OXY contin 10 & 15's

## 2022-03-21 NOTE — Progress Notes (Signed)
Changed pharmacy.   [name] Pounds  APRN, MSN, FNP-C [facility] [address] [place],  [address] [phone]

## 2022-03-21 NOTE — Progress Notes (Signed)
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 Patient tolerated well. Alert, oriented and ambulatory at discharge. 

## 2022-03-22 ENCOUNTER — Telehealth (HOSPITAL_COMMUNITY): Payer: Self-pay

## 2022-03-22 ENCOUNTER — Other Ambulatory Visit (HOSPITAL_COMMUNITY): Payer: Self-pay

## 2022-03-22 ENCOUNTER — Non-Acute Institutional Stay (HOSPITAL_COMMUNITY)
Admission: AD | Admit: 2022-03-22 | Discharge: 2022-03-22 | Disposition: A | Discharge status: Home / Self Care | Payer: Medicare Other | Attending: Internal Medicine | Admitting: Internal Medicine

## 2022-03-22 ENCOUNTER — Other Ambulatory Visit: Payer: Self-pay | Admitting: Family Medicine

## 2022-03-22 DIAGNOSIS — G894 Chronic pain syndrome: Secondary | ICD-10-CM | POA: Diagnosis not present

## 2022-03-22 DIAGNOSIS — D638 Anemia in other chronic diseases classified elsewhere: Secondary | ICD-10-CM | POA: Insufficient documentation

## 2022-03-22 DIAGNOSIS — N184 Chronic kidney disease, stage 4 (severe): Secondary | ICD-10-CM | POA: Insufficient documentation

## 2022-03-22 DIAGNOSIS — D5700 Hb-SS disease with crisis, unspecified: Secondary | ICD-10-CM | POA: Diagnosis present

## 2022-03-22 DIAGNOSIS — D571 Sickle-cell disease without crisis: Secondary | ICD-10-CM

## 2022-03-22 DIAGNOSIS — F1120 Opioid dependence, uncomplicated: Secondary | ICD-10-CM | POA: Diagnosis not present

## 2022-03-22 LAB — CBC WITH DIFFERENTIAL/PLATELET
Abs Immature Granulocytes: 0.07 10*3/uL (ref 0.00–0.07)
Basophils Absolute: 0 10*3/uL (ref 0.0–0.1)
Basophils Relative: 0 %
Eosinophils Absolute: 0.1 10*3/uL (ref 0.0–0.5)
Eosinophils Relative: 1 %
HCT: 22 % — ABNORMAL LOW (ref 39.0–52.0)
Hemoglobin: 8 g/dL — ABNORMAL LOW (ref 13.0–17.0)
Immature Granulocytes: 1 %
Lymphocytes Relative: 50 %
Lymphs Abs: 5.9 10*3/uL — ABNORMAL HIGH (ref 0.7–4.0)
MCH: 43.5 pg — ABNORMAL HIGH (ref 26.0–34.0)
MCHC: 36.4 g/dL — ABNORMAL HIGH (ref 30.0–36.0)
MCV: 119.6 fL — ABNORMAL HIGH (ref 80.0–100.0)
Monocytes Absolute: 0.7 10*3/uL (ref 0.1–1.0)
Monocytes Relative: 6 %
Neutro Abs: 4.8 10*3/uL (ref 1.7–7.7)
Neutrophils Relative %: 42 %
Platelets: 167 10*3/uL (ref 150–400)
RBC: 1.84 MIL/uL — ABNORMAL LOW (ref 4.22–5.81)
RDW: 17.8 % — ABNORMAL HIGH (ref 11.5–15.5)
WBC: 11.6 10*3/uL — ABNORMAL HIGH (ref 4.0–10.5)
nRBC: 0.2 % (ref 0.0–0.2)

## 2022-03-22 LAB — COMPREHENSIVE METABOLIC PANEL
ALT: 44 U/L (ref 0–44)
AST: 62 U/L — ABNORMAL HIGH (ref 15–41)
Albumin: 3.1 g/dL — ABNORMAL LOW (ref 3.5–5.0)
Alkaline Phosphatase: 114 U/L (ref 38–126)
Anion gap: 11 (ref 5–15)
BUN: 43 mg/dL — ABNORMAL HIGH (ref 6–20)
CO2: 26 mmol/L (ref 22–32)
Calcium: 8.1 mg/dL — ABNORMAL LOW (ref 8.9–10.3)
Chloride: 101 mmol/L (ref 98–111)
Creatinine, Ser: 3.17 mg/dL — ABNORMAL HIGH (ref 0.61–1.24)
GFR, Estimated: 25 mL/min — ABNORMAL LOW (ref >60)
Glucose, Bld: 84 mg/dL (ref 70–99)
Potassium: 4.6 mmol/L (ref 3.5–5.1)
Sodium: 138 mmol/L (ref 135–145)
Total Bilirubin: 1.2 mg/dL (ref 0.3–1.2)
Total Protein: 6.5 g/dL (ref 6.5–8.1)

## 2022-03-22 LAB — RETICULOCYTES
Immature Retic Fract: 33.8 % — ABNORMAL HIGH (ref 2.3–15.9)
RBC.: 1.85 MIL/uL — ABNORMAL LOW (ref 4.22–5.81)
Retic Count, Absolute: 74.4 10*3/uL (ref 19.0–186.0)
Retic Ct Pct: 4 % — ABNORMAL HIGH (ref 0.4–3.1)

## 2022-03-22 MED ORDER — SODIUM CHLORIDE 0.9% FLUSH: 9.0000 mL | INTRAVENOUS | Status: DC | PRN

## 2022-03-22 MED ORDER — HYDROMORPHONE 1 MG/ML IV SOLN
INTRAVENOUS | Status: DC
  Administered 2022-03-22: 30 mg via INTRAVENOUS
  Administered 2022-03-22: 8.5 mg via INTRAVENOUS
  Filled 2022-03-22: qty 30

## 2022-03-22 MED ORDER — OXYCODONE HCL ER 15 MG PO T12A
15.0000 mg | EXTENDED_RELEASE_TABLET | Freq: Two times a day (BID) | ORAL | 0 refills | Status: DC
  Filled 2022-03-22: qty 60, 30d supply, fill #0

## 2022-03-22 MED ORDER — ACETAMINOPHEN 500 MG PO TABS
1000.0000 mg | ORAL_TABLET | Freq: Once | ORAL | Status: AC
  Administered 2022-03-22: 1000 mg via ORAL
  Filled 2022-03-22: qty 2

## 2022-03-22 MED ORDER — SODIUM CHLORIDE 0.45 % IV SOLN: INTRAVENOUS | Status: DC

## 2022-03-22 MED ORDER — ONDANSETRON HCL 4 MG/2ML IJ SOLN: 4.0000 mg | Freq: Four times a day (QID) | INTRAMUSCULAR | Status: DC | PRN

## 2022-03-22 MED ORDER — NALOXONE HCL 0.4 MG/ML IJ SOLN: 0.4000 mg | INTRAMUSCULAR | Status: DC | PRN

## 2022-03-22 MED ORDER — DIPHENHYDRAMINE HCL 25 MG PO CAPS: 25.0000 mg | ORAL_CAPSULE | ORAL | Status: DC | PRN

## 2022-03-22 NOTE — Telephone Encounter (Signed)
Pt called the day hospital wanting to come in today for sickle cell pain treatment. Pt reports 10/10 pain to legs. Pt has not taken anything for pain as he is out of pain medication and hasn't been able to get from pharmacy. Pt denies COVID exposure and symptoms. Pt denies fever, chest pain, N/V/D, abdominal pain, and priapism. [place] hollis, [place] notified and she said pt can come to day hospital today. Pt notified and verbalized understanding. Pt said that his mom will be his transportation today.

## 2022-03-22 NOTE — Progress Notes (Signed)
Reviewed PDMP substance reporting system prior to prescribing opiate medications. No inconsistencies noted.  Meds ordered this encounter  Medications   oxyCODONE (OXYCONTIN) 15 mg 12 hr tablet    Sig: Take 1 tablet (15 mg total) by mouth every 12 (twelve) hours.    Dispense:  60 tablet    Refill:  0    Order Specific Question:   Supervising Provider    Answer:   [name], [name] [[id number]]   Arsema Tusing Moore Ndrew Creason  APRN, MSN, FNP-C [facility] [address]  [place], [place] [address] [phone]  

## 2022-03-22 NOTE — H&P (Addendum)
[facility] History and Physical   Date: 03/22/2022  Patient name: [name] [name]. Medical record number: [id number] Date of birth: 07-24-1980 Age: 42 y.o. Gender: male PCP: [name], FNP  Attending physician: [name], MD  Chief Complaint: Sickle cell pain   History of Present Illness: [name] [name] is a 42 year old male with a medical history significant for sickle cell disease, chronic pain syndrome, opiate dependence and tolerance, chronic kidney disease IV, and anemia of chronic disease presents with complaints of lower extremity pain that is consistent with his typical sickle cell pain crisis.  Patient states that pain intensity increased on yesterday and has been unrelieved by his home medications.  Patient says that he is out of long-acting OxyContin.  He has not been able to pick up this medication due to insurance constraints.  Patient endorses pain primarily to bilateral lower extremities.  Also, patient reports right knee swelling.  He has not had no recent falls or injuries.  Patient last had oxycodone this a.m. with some relief.  He rates pain as 10/10, constant, and throbbing.  Patient states that it is difficult to bear weight on right leg.  He is under the care of of orthopedic specialist.  He denies fever, chills, chest pain, or shortness of breath.  No urinary symptoms, nausea, vomiting, or diarrhea.  Meds: Medications Prior to Admission  Medication Sig Dispense Refill Last Dose   amitriptyline (ELAVIL) 25 MG tablet Take 50 mg by mouth at bedtime.      calcitRIOL (ROCALTROL) 0.25 MCG capsule Take 0.25 mcg by mouth daily with breakfast.      Deferiprone, Twice Daily, (FERRIPROX TWICE-A-DAY) 1000 MG TABS Take 3,000 mg by mouth daily.      folic acid (FOLVITE) 1 MG tablet TAKE 1 TABLET BY MOUTH EVERY DAY (Patient taking differently: Take 1 mg by mouth every morning.) 90 tablet 4    hydroxyurea (HYDREA) 500 MG capsule Take 1-2 capsules  (500-1,000 mg total) by mouth See admin instructions. Take 500 mg by mouth in the morning on Sun/Tues/Wed/Thurs/Sat and 1,000 mg on Mon/Fri 260 capsule 1    Oxycodone HCl 10 MG TABS Take 1 tablet by mouth every 6 hours as needed (pain). 60 tablet 0    sodium bicarbonate 650 MG tablet Take 1,950 mg by mouth with breakfast, with lunch, and with evening meal.      TYLENOL 8 HOUR ARTHRITIS PAIN 650 MG CR tablet Take 650 mg by mouth every 8 (eight) hours as needed for pain.      VELTASSA 16.8 g PACK Take 16.8 g by mouth daily.      voxelotor (OXBRYTA) 500 MG TABS tablet Take 1,500 mg by mouth daily with breakfast.       Allergies: Nsaids and Morphine and related Past Medical History:  Diagnosis Date   Abscess of right iliac fossa 09/24/2018   required Perc Drain 09/24/2018   Arachnoid Cyst of brain bilaterally    "2 really small ones in the back of my head; inside; saw them w/MRI" (09/25/2012)   Bacterial pneumonia ~ 2012   "caught it here in the hospital" (09/25/2012)   Chronic kidney disease    "from my sickle cell" (09/25/2012)   CKD (chronic kidney disease), stage II    Colitis 04/19/2017   CT scan abd/ pelvis   GERD (gastroesophageal reflux disease)    "after I eat alot of spicey foods" (09/25/2012)   Gynecomastia, male 07/10/2012   History  of blood transfusion    "always related to sickle cell crisis" (09/25/2012)   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   Noted in noted from Hematology notes at [place]    "take RX qd to prevent them" (09/25/2012)   Opioid dependence with withdrawal ([place]) 08/30/2012   Renal insufficiency    Sickle cell anemia (HCC)    Sickle cell crisis ([place]) 09/25/2012   Sickle cell nephropathy ([place]) 07/10/2012   Sinus tachycardia    Tachycardia with heart rate 121-140 beats per minute with ambulation 08/04/2016   Past Surgical History:  Procedure Laterality Date   ABCESS DRAINAGE     CHOLECYSTECTOMY  ~ 2012   COLONOSCOPY N/A 04/23/2017    Procedure: COLONOSCOPY;  Surgeon: Irene Shipper, MD;  Location: [facility];  Service: Endoscopy;  Laterality: N/A;   IR FLUORO GUIDE CV LINE RIGHT  12/17/2016   IR FLUORO GUIDE CV LINE RIGHT  02/07/2021   IR RADIOLOGIST EVAL & MGMT  10/02/2018   IR RADIOLOGIST EVAL & MGMT  10/15/2018   IR REMOVAL TUN CV CATH W/O FL  12/21/2016   IR REMOVAL TUN CV CATH W/O FL  02/11/2021   IR US GUIDE VASC ACCESS RIGHT  12/17/2016   IR US GUIDE VASC ACCESS RIGHT  02/07/2021   spleenectomy     Family History  Problem Relation Age of Onset   Breast cancer Mother    Social History   Socioeconomic History   Marital status: Single    Spouse name: Not on file   Number of children: Not on file   Years of education: Not on file   Highest education level: Not on file  Occupational History   Not on file  Tobacco Use   Smoking status: Some Days    Packs/day: 0.10    Types: Cigarettes   Smokeless tobacco: Never  Vaping Use   Vaping Use: Never used  Substance and Sexual Activity   Alcohol use: Yes    Comment: Once a month   Drug use: Yes    Types: Marijuana    Comment: Every 2-3 weeks    Sexual activity: Yes    Birth control/protection: Condom  Other Topics Concern   Not on file  Social History Narrative    Lives alone in [place].   Back at school, studying Public relations account executive. Unemployed.    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    Social Determinants of Health   Financial Resource Strain: Low Risk  (02/09/2022)   Overall Financial Resource Strain (CARDIA)    Difficulty of Paying Living Expenses: Not hard at all  Food Insecurity: No Food Insecurity (02/09/2022)   Hunger Vital Sign    Worried About Running Out of Food in the Last Year: Never true    Ran Out of Food in the Last Year: Never true  Transportation Needs: No Transportation Needs  (02/09/2022)   PRAPARE - Hydrologist (Medical): No    Lack of Transportation (Non-Medical): No  Recent Concern: Transportation Needs - Unmet Transportation Needs (12/27/2021)   PRAPARE - Transportation    Lack of Transportation (Medical): Yes    Lack of Transportation (Non-Medical): Yes  Physical Activity: Sufficiently Active (02/09/2022)   Exercise Vital Sign    Days of Exercise per Week: 7 days    Minutes of  Exercise per Session: 30 min  Stress: No Stress Concern Present (02/09/2022)   [place]    Feeling of Stress : Not at all  Social Connections: Socially Isolated (02/09/2022)   Social Connection and Isolation Panel [NHANES]    Frequency of Communication with Friends and Family: More than three times a week    Frequency of Social Gatherings with Friends and Family: Once a week    Attends Religious Services: Never    Marine scientist or Organizations: No    Attends Archivist Meetings: Never    Marital Status: Divorced  Human resources officer Violence: Not At Risk (02/09/2022)   Humiliation, Afraid, Rape, and Kick questionnaire    Fear of Current or Ex-Partner: No    Emotionally Abused: No    Physically Abused: No    Sexually Abused: No  Review of Systems  Constitutional:  Negative for chills and fever.  HENT: Negative.    Eyes: Negative.   Respiratory: Negative.    Cardiovascular: Negative.   Gastrointestinal: Negative.   Genitourinary: Negative.   Musculoskeletal:  Positive for back pain and joint pain.  Skin: Negative.   Neurological: Negative.   Psychiatric/Behavioral: Negative.      Physical Exam: Blood pressure 111/68, pulse 85, temperature 98 F (36.7 C), temperature source Temporal, resp. rate 16, SpO2 97 %. Physical Exam Constitutional:      Appearance: Normal appearance.  Eyes:     Pupils: Pupils are equal, round, and reactive to light.   Cardiovascular:     Rate and Rhythm: Normal rate and regular rhythm.     Pulses: Normal pulses.  Pulmonary:     Effort: Pulmonary effort is normal.  Abdominal:     General: Bowel sounds are normal.  Musculoskeletal:     Right knee: Swelling present. Decreased range of motion. Tenderness present.  Skin:    General: Skin is warm.  Neurological:     General: No focal deficit present.     Mental Status: He is alert. Mental status is at baseline.  Psychiatric:        Mood and Affect: Mood normal.        Behavior: Behavior normal.        Thought Content: Thought content normal.        Judgment: Judgment normal.      Lab results: Results for orders placed or performed during the hospital encounter of 03/21/22 (from the past 24 hour(s))  CBC with Differential/Platelet     Status: Abnormal   Collection Time: 03/21/22 11:12 AM  Result Value Ref Range   WBC 11.9 (H) 4.0 - 10.5 K/uL   RBC 1.84 (L) 4.22 - 5.81 MIL/uL   Hemoglobin 8.1 (L) 13.0 - 17.0 g/dL   HCT 22.8 (L) 39.0 - 52.0 %   MCV 123.9 (H) 80.0 - 100.0 fL   MCH 44.0 (H) 26.0 - 34.0 pg   MCHC 35.5 30.0 - 36.0 g/dL   RDW 18.3 (H) 11.5 - 15.5 %   Platelets 144 (L) 150 - 400 K/uL   nRBC 0.2 0.0 - 0.2 %   Neutrophils Relative % 34 %   Neutro Abs 4.1 1.7 - 7.7 K/uL   Lymphocytes Relative 59 %   Lymphs Abs 6.9 (H) 0.7 - 4.0 K/uL   Monocytes Relative 6 %   Monocytes Absolute 0.8 0.1 - 1.0 K/uL   Eosinophils Relative 1 %   Eosinophils Absolute 0.1 0.0 - 0.5 K/uL  Basophils Relative 0 %   Basophils Absolute 0.0 0.0 - 0.1 K/uL   Immature Granulocytes 0 %   Abs Immature Granulocytes 0.04 0.00 - 0.07 K/uL   Polychromasia PRESENT    Sickle Cells PRESENT    Target Cells PRESENT   Reticulocytes     Status: Abnormal   Collection Time: 03/21/22 11:12 AM  Result Value Ref Range   Retic Ct Pct 4.1 (H) 0.4 - 3.1 %   RBC. 1.86 (L) 4.22 - 5.81 MIL/uL   Retic Count, Absolute 75.9 19.0 - 186.0 K/uL   Immature Retic Fract 25.5 (H) 2.3  - 15.9 %  Comprehensive metabolic panel     Status: Abnormal   Collection Time: 03/21/22 11:12 AM  Result Value Ref Range   Sodium 137 135 - 145 mmol/L   Potassium 4.5 3.5 - 5.1 mmol/L   Chloride 103 98 - 111 mmol/L   CO2 20 (L) 22 - 32 mmol/L   Glucose, Bld 80 70 - 99 mg/dL   BUN 47 (H) 6 - 20 mg/dL   Creatinine, Ser 3.12 (H) 0.61 - 1.24 mg/dL   Calcium 8.6 (L) 8.9 - 10.3 mg/dL   Total Protein 6.8 6.5 - 8.1 g/dL   Albumin 3.3 (L) 3.5 - 5.0 g/dL   AST 54 (H) 15 - 41 U/L   ALT 49 (H) 0 - 44 U/L   Alkaline Phosphatase 134 (H) 38 - 126 U/L   Total Bilirubin 1.0 0.3 - 1.2 mg/dL   GFR, Estimated 25 (L) >60 mL/min   Anion gap 14 5 - 15   *Note: Due to a large number of results and/or encounters for the requested time period, some results have not been displayed. A complete set of results can be found in Results Review.    Imaging results:  No results found.   Assessment & Plan: Patient admitted to sickle cell day infusion center for management of pain crisis.  Patient is opiate tolerant Initiate IV dilaudid PCA.  IV fluids, 0.45% at 100 ml/hr Toradol 15 mg IV times one dose Tylenol 1000 mg by mouth times one dose Review CBC with differential, complete metabolic panel, and reticulocytes as results become available. Pain intensity will be reevaluated in context of functioning and relationship to baseline as care progresses If pain intensity remains elevated and/or sudden change in hemodynamic stability transition to inpatient services for higher level of care.     [name]  APRN, MSN, FNP-C [facility] [address] [place], [place] [address] [phone]  03/22/2022, 10:10 AM

## 2022-03-22 NOTE — Discharge Summary (Signed)
[facility] Discharge Summary   Patient ID: [name] [name]. MRN: [id number] DOB/AGE: February 10, 1981 42 y.o.  Admit date: 03/22/2022 Discharge date: 03/22/2022  Primary Care Physician:  [name], FNP  Admission Diagnoses:  Active Problems:   Sickle cell pain crisis [facility])   Discharge Medications:  Allergies as of 03/22/2022       Reactions   Nsaids Other (See Comments)   Kidney disease   Morphine And Related Other (See Comments)   "Real bad headaches"         Medication List     TAKE these medications    amitriptyline 25 MG tablet Commonly known as: ELAVIL Take 50 mg by mouth at bedtime.   calcitRIOL 0.25 MCG capsule Commonly known as: ROCALTROL Take 0.25 mcg by mouth daily with breakfast.   Ferriprox Twice-A-Day 1000 MG Tabs Generic drug: Deferiprone (Twice Daily) Take 3,000 mg by mouth daily.   folic acid 1 MG tablet Commonly known as: FOLVITE TAKE 1 TABLET BY MOUTH EVERY DAY What changed: when to take this   hydroxyurea 500 MG capsule Commonly known as: HYDREA Take 1-2 capsules (500-1,000 mg total) by mouth See admin instructions. Take 500 mg by mouth in the morning on Sun/Tues/Wed/Thurs/Sat and 1,000 mg on Mon/Fri   Oxbryta 500 MG Tabs tablet Generic drug: voxelotor Take 1,500 mg by mouth daily with breakfast.   Oxycodone HCl 10 MG Tabs Take 1 tablet by mouth every 6 hours as needed (pain). What changed: Another medication with the same name was added. Make sure you understand how and when to take each.   OxyCONTIN 15 mg 12 hr tablet Generic drug: oxyCODONE Take 1 tablet (15 mg total) by mouth every 12 (twelve) hours. What changed: You were already taking a medication with the same name, and this prescription was added. Make sure you understand how and when to take each.   sodium bicarbonate 650 MG tablet Take 1,950 mg by mouth with breakfast, with lunch, and with evening meal.   Tylenol 8 Hour Arthritis Pain 650 MG  CR tablet Generic drug: acetaminophen Take 650 mg by mouth every 8 (eight) hours as needed for pain.   Veltassa 16.8 g Pack Generic drug: Patiromer Sorbitex Calcium Take 16.8 g by mouth daily.         Consults:  None  Significant Diagnostic Studies:  No results found.   History of Present Illness: [name] [name] is a 42 year old male with a medical history significant for sickle cell disease, chronic pain syndrome, opiate dependence and tolerance, chronic kidney disease IV, and anemia of chronic disease presents with complaints of lower extremity pain that is consistent with his typical sickle cell pain crisis.  Patient states that pain intensity increased on yesterday and has been unrelieved by his home medications.  Patient says that he is out of long-acting OxyContin.  He has not been able to pick up this medication due to insurance constraints.  Patient endorses pain primarily to bilateral lower extremities.  Also, patient reports right knee swelling.  He has not had no recent falls or injuries.  Patient last had oxycodone this a.m. with some relief.  He rates pain as 10/10, constant, and throbbing.  Patient states that it is difficult to bear weight on right leg.  He is under the care of of orthopedic specialist.  He denies fever, chills, chest pain, or shortness of breath.  No urinary symptoms, nausea, vomiting, or diarrhea [facility]  Course: Patient admitted to sickle cell day infusion clinic for management of pain crisis.  Review laboratory values, no changes from yesterday and consistent with his baseline. Pain managed with IV Dilaudid PCA, IV fluids, and Tylenol. Pain intensity decreased to 3/10 and patient is requesting discharge home. He is alert, oriented, and ambulating without assistance.  He will discharge home in a hemodynamically stable condition. Erma advised to follow-up with his orthopedic specialist at [facility] for further management of right knee  swelling and pain.  Patient will continue to follow-up with [facility] hematology for sickle cell care   Discharge instructions: Resume all home medications.   Follow up with PCP as previously  scheduled.   Discussed the importance of drinking 64 ounces of water daily, dehydration of red blood cells may lead further sickling.   Avoid all stressors that precipitate sickle cell pain crisis.     The patient was given clear instructions to go to ER or return to medical center if symptoms do not improve, worsen or new problems develop.   Physical Exam at Discharge:  BP 112/72 (BP Location: Right Arm)   Pulse 78   Temp 97.6 F (36.4 C) (Temporal)   Resp 14   SpO2 98%  Physical Exam Constitutional:      Appearance: Normal appearance.  Eyes:     Pupils: Pupils are equal, round, and reactive to light.  Cardiovascular:     Rate and Rhythm: Normal rate.     Heart sounds: Murmur heard.  Pulmonary:     Effort: Pulmonary effort is normal.  Abdominal:     General: Bowel sounds are normal.  Musculoskeletal:        General: Normal range of motion.     Cervical back: Normal range of motion.     Right knee: Swelling present. Tenderness present.  Skin:    General: Skin is warm.  Neurological:     General: No focal deficit present.     Mental Status: He is alert. Mental status is at baseline.  Psychiatric:        Mood and Affect: Mood normal.        Behavior: Behavior normal.        Thought Content: Thought content normal.        Judgment: Judgment normal.      Disposition at Discharge: Discharge disposition: 01-Home or Self Care       Discharge Orders: Discharge Instructions     Discharge patient   Complete by: As directed    Discharge disposition: 01-Home or Self Care   Discharge patient date: 03/22/2022       Condition at Discharge:   Stable  Time spent on Discharge:  Greater than 30 minutes.  Signed: [name]  APRN, MSN, FNP-C [facility] [address] [place], [place] [address] [phone]  03/22/2022, 4:01 PM

## 2022-03-22 NOTE — Progress Notes (Signed)
Pt admitted to day hospital today for sickle cell pain treatment. On arrival, pt reports 10/10 pain to bilateral legs. Pt received Dilaudid PCA and hydrated with IV fluids via PIV. Pt also received 1,000 mg PO Tylenol. At discharge, pt rated 3/10 pain. AVS printed and given to patient. Pt alert, oriented and ambulatory at discharge.

## 2022-03-29 IMAGING — DX DG CHEST 1V PORT
1 series · 1 of 1 positions shown · non-contrast
Comparison: 07/30/2020

CLINICAL DATA: Chest pain, short of breath, history of sickle cell
disease

EXAM:
PORTABLE CHEST 1 VIEW

[chest ap]
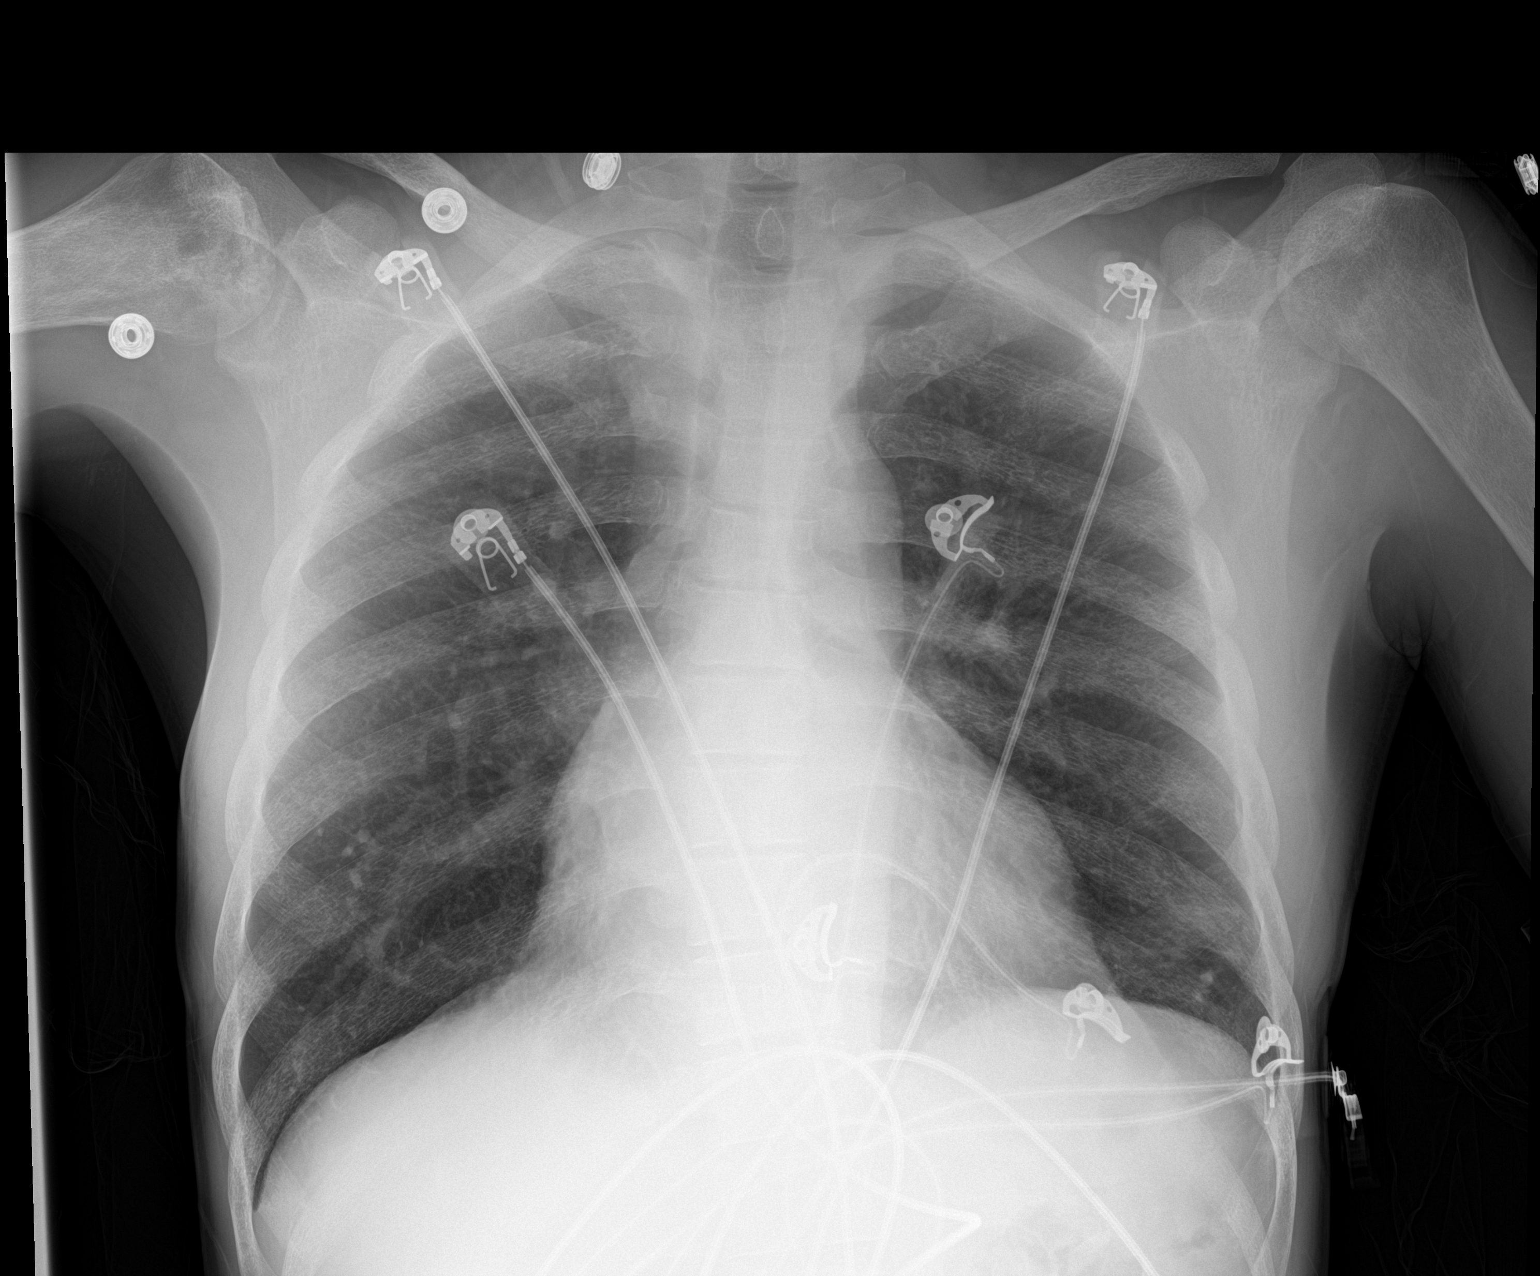

[1 of 1 positions shown; findings below may reference images not displayed]

FINDINGS: Frontal view of the chest demonstrates a stable cardiac silhouette.
No acute airspace disease, effusion, or pneumothorax. Diffuse bony
changes consistent with known history of sickle cell disease,
stable. Chronic bilateral humeral head avascular necrosis, right
greater than left.
IMPRESSION: 1. No acute intrathoracic process.
2. Stable bony changes related to known sickle cell disease.

## 2022-04-03 ENCOUNTER — Other Ambulatory Visit: Payer: Self-pay

## 2022-04-03 ENCOUNTER — Inpatient Hospital Stay (HOSPITAL_COMMUNITY)
Admission: EM | Admit: 2022-04-03 | Discharge: 2022-04-05 | DRG: 812 | Disposition: A | Discharge status: Home / Self Care | Payer: Medicare Other | Attending: Internal Medicine | Admitting: Internal Medicine

## 2022-04-03 ENCOUNTER — Encounter (HOSPITAL_COMMUNITY): Payer: Self-pay

## 2022-04-03 ENCOUNTER — Other Ambulatory Visit: Payer: Self-pay | Admitting: Family Medicine

## 2022-04-03 ENCOUNTER — Telehealth (HOSPITAL_COMMUNITY): Payer: Self-pay | Admitting: *Deleted

## 2022-04-03 DIAGNOSIS — F1120 Opioid dependence, uncomplicated: Secondary | ICD-10-CM | POA: Diagnosis present

## 2022-04-03 DIAGNOSIS — N08 Glomerular disorders in diseases classified elsewhere: Secondary | ICD-10-CM | POA: Diagnosis present

## 2022-04-03 DIAGNOSIS — F1290 Cannabis use, unspecified, uncomplicated: Secondary | ICD-10-CM | POA: Diagnosis present

## 2022-04-03 DIAGNOSIS — F17210 Nicotine dependence, cigarettes, uncomplicated: Secondary | ICD-10-CM | POA: Diagnosis present

## 2022-04-03 DIAGNOSIS — D638 Anemia in other chronic diseases classified elsewhere: Secondary | ICD-10-CM | POA: Diagnosis present

## 2022-04-03 DIAGNOSIS — N184 Chronic kidney disease, stage 4 (severe): Secondary | ICD-10-CM | POA: Diagnosis present

## 2022-04-03 DIAGNOSIS — Z886 Allergy status to analgesic agent status: Secondary | ICD-10-CM | POA: Diagnosis not present

## 2022-04-03 DIAGNOSIS — Z79899 Other long term (current) drug therapy: Secondary | ICD-10-CM

## 2022-04-03 DIAGNOSIS — D72829 Elevated white blood cell count, unspecified: Secondary | ICD-10-CM | POA: Diagnosis present

## 2022-04-03 DIAGNOSIS — Z885 Allergy status to narcotic agent status: Secondary | ICD-10-CM | POA: Diagnosis not present

## 2022-04-03 DIAGNOSIS — K219 Gastro-esophageal reflux disease without esophagitis: Secondary | ICD-10-CM | POA: Diagnosis present

## 2022-04-03 DIAGNOSIS — G894 Chronic pain syndrome: Secondary | ICD-10-CM | POA: Diagnosis present

## 2022-04-03 DIAGNOSIS — D5700 Hb-SS disease with crisis, unspecified: Secondary | ICD-10-CM | POA: Diagnosis not present

## 2022-04-03 DIAGNOSIS — N189 Chronic kidney disease, unspecified: Secondary | ICD-10-CM | POA: Diagnosis present

## 2022-04-03 DIAGNOSIS — N179 Acute kidney failure, unspecified: Secondary | ICD-10-CM | POA: Diagnosis present

## 2022-04-03 LAB — COMPREHENSIVE METABOLIC PANEL
ALT: 37 U/L (ref 0–44)
AST: 47 U/L — ABNORMAL HIGH (ref 15–41)
Albumin: 3.3 g/dL — ABNORMAL LOW (ref 3.5–5.0)
Alkaline Phosphatase: 145 U/L — ABNORMAL HIGH (ref 38–126)
Anion gap: 13 (ref 5–15)
BUN: 43 mg/dL — ABNORMAL HIGH (ref 6–20)
CO2: 26 mmol/L (ref 22–32)
Calcium: 8.5 mg/dL — ABNORMAL LOW (ref 8.9–10.3)
Chloride: 97 mmol/L — ABNORMAL LOW (ref 98–111)
Creatinine, Ser: 4.06 mg/dL — ABNORMAL HIGH (ref 0.61–1.24)
GFR, Estimated: 18 mL/min — ABNORMAL LOW (ref >60)
Glucose, Bld: 108 mg/dL — ABNORMAL HIGH (ref 70–99)
Potassium: 3.6 mmol/L (ref 3.5–5.1)
Sodium: 136 mmol/L (ref 135–145)
Total Bilirubin: 1.5 mg/dL — ABNORMAL HIGH (ref 0.3–1.2)
Total Protein: 7.4 g/dL (ref 6.5–8.1)

## 2022-04-03 LAB — CBC WITH DIFFERENTIAL/PLATELET
Abs Immature Granulocytes: 0.04 10*3/uL (ref 0.00–0.07)
Basophils Absolute: 0 10*3/uL (ref 0.0–0.1)
Basophils Relative: 0 %
Eosinophils Absolute: 0.2 10*3/uL (ref 0.0–0.5)
Eosinophils Relative: 1 %
HCT: 21.8 % — ABNORMAL LOW (ref 39.0–52.0)
Hemoglobin: 8 g/dL — ABNORMAL LOW (ref 13.0–17.0)
Immature Granulocytes: 0 %
Lymphocytes Relative: 36 %
Lymphs Abs: 4.8 10*3/uL — ABNORMAL HIGH (ref 0.7–4.0)
MCH: 44.4 pg — ABNORMAL HIGH (ref 26.0–34.0)
MCHC: 36.7 g/dL — ABNORMAL HIGH (ref 30.0–36.0)
MCV: 121.1 fL — ABNORMAL HIGH (ref 80.0–100.0)
Monocytes Absolute: 0.8 10*3/uL (ref 0.1–1.0)
Monocytes Relative: 6 %
Neutro Abs: 7.3 10*3/uL (ref 1.7–7.7)
Neutrophils Relative %: 57 %
Platelets: 159 10*3/uL (ref 150–400)
RBC: 1.8 MIL/uL — ABNORMAL LOW (ref 4.22–5.81)
RDW: 19.7 % — ABNORMAL HIGH (ref 11.5–15.5)
WBC: 13.1 10*3/uL — ABNORMAL HIGH (ref 4.0–10.5)
nRBC: 0.3 % — ABNORMAL HIGH (ref 0.0–0.2)

## 2022-04-03 LAB — RETICULOCYTES
Immature Retic Fract: 31.1 % — ABNORMAL HIGH (ref 2.3–15.9)
RBC.: 1.81 MIL/uL — ABNORMAL LOW (ref 4.22–5.81)
Retic Count, Absolute: 110.6 10*3/uL (ref 19.0–186.0)
Retic Ct Pct: 6.1 % — ABNORMAL HIGH (ref 0.4–3.1)

## 2022-04-03 MED ORDER — SODIUM CHLORIDE 0.45 % IV SOLN: INTRAVENOUS | Status: DC

## 2022-04-03 MED ORDER — ONDANSETRON HCL 4 MG/2ML IJ SOLN
4.0000 mg | INTRAMUSCULAR | Status: DC | PRN
  Administered 2022-04-03 (×2): 4 mg via INTRAVENOUS
  Filled 2022-04-03 (×2): qty 2

## 2022-04-03 MED ORDER — ONDANSETRON HCL 4 MG/2ML IJ SOLN: 4.0000 mg | Freq: Four times a day (QID) | INTRAMUSCULAR | Status: DC | PRN

## 2022-04-03 MED ORDER — HYDROMORPHONE HCL 2 MG/ML IJ SOLN
2.0000 mg | INTRAMUSCULAR | Status: AC
  Administered 2022-04-03: 2 mg via INTRAVENOUS
  Filled 2022-04-03: qty 1

## 2022-04-03 MED ORDER — SODIUM CHLORIDE 0.9 % IV SOLN: INTRAVENOUS | Status: DC

## 2022-04-03 MED ORDER — ENOXAPARIN SODIUM 30 MG/0.3ML IJ SOSY: 30.0000 mg | PREFILLED_SYRINGE | INTRAMUSCULAR | Status: DC

## 2022-04-03 MED ORDER — HYDROXYUREA 500 MG PO CAPS: 500.0000 mg | ORAL_CAPSULE | ORAL | Status: DC

## 2022-04-03 MED ORDER — PATIROMER SORBITEX CALCIUM 16.8 G PO PACK: 16.8000 g | PACK | Freq: Every day | ORAL | Status: DC

## 2022-04-03 MED ORDER — SODIUM CHLORIDE 0.9 % IV SOLN
12.5000 mg | Freq: Once | INTRAVENOUS | Status: AC
  Administered 2022-04-03: 12.5 mg via INTRAVENOUS
  Filled 2022-04-03: qty 12.5

## 2022-04-03 MED ORDER — DEFERIPRONE (TWICE DAILY) 1000 MG PO TABS
2000.0000 mg | ORAL_TABLET | Freq: Two times a day (BID) | ORAL | Status: DC
  Administered 2022-04-04: 2000 mg via ORAL

## 2022-04-03 MED ORDER — FOLIC ACID 1 MG PO TABS
1.0000 mg | ORAL_TABLET | Freq: Every day | ORAL | Status: DC
  Administered 2022-04-04 – 2022-04-05 (×2): 1 mg via ORAL
  Filled 2022-04-03 (×2): qty 1

## 2022-04-03 MED ORDER — HYDROXYUREA 500 MG PO CAPS: 1000.0000 mg | ORAL_CAPSULE | ORAL | Status: DC

## 2022-04-03 MED ORDER — SODIUM CHLORIDE 0.9% FLUSH: 9.0000 mL | INTRAVENOUS | Status: DC | PRN

## 2022-04-03 MED ORDER — AMITRIPTYLINE HCL 25 MG PO TABS
50.0000 mg | ORAL_TABLET | Freq: Every day | ORAL | Status: DC
  Administered 2022-04-03 – 2022-04-04 (×2): 50 mg via ORAL
  Filled 2022-04-03 (×2): qty 2

## 2022-04-03 MED ORDER — SODIUM CHLORIDE 0.9 % IV BOLUS
1000.0000 mL | Freq: Once | INTRAVENOUS | Status: AC
  Administered 2022-04-03: 1000 mL via INTRAVENOUS

## 2022-04-03 MED ORDER — NALOXONE HCL 0.4 MG/ML IJ SOLN: 0.4000 mg | INTRAMUSCULAR | Status: DC | PRN

## 2022-04-03 MED ORDER — SODIUM BICARBONATE 650 MG PO TABS
1950.0000 mg | ORAL_TABLET | Freq: Three times a day (TID) | ORAL | Status: DC
  Administered 2022-04-03 – 2022-04-05 (×5): 1950 mg via ORAL
  Filled 2022-04-03 (×5): qty 3

## 2022-04-03 MED ORDER — VOXELOTOR 500 MG PO TABS
1500.0000 mg | ORAL_TABLET | Freq: Every day | ORAL | Status: DC
  Administered 2022-04-04: 1500 mg via ORAL

## 2022-04-03 MED ORDER — HYDROMORPHONE HCL 2 MG/ML IJ SOLN: 2.0000 mg | INTRAMUSCULAR | Status: DC | PRN

## 2022-04-03 MED ORDER — DIPHENHYDRAMINE HCL 25 MG PO CAPS
25.0000 mg | ORAL_CAPSULE | ORAL | Status: DC | PRN
  Administered 2022-04-03 – 2022-04-04 (×2): 25 mg via ORAL
  Filled 2022-04-03 (×2): qty 1

## 2022-04-03 MED ORDER — HYDROXYUREA 500 MG PO CAPS
500.0000 mg | ORAL_CAPSULE | ORAL | Status: DC
  Administered 2022-04-04 – 2022-04-05 (×2): 500 mg via ORAL
  Filled 2022-04-03 (×2): qty 1

## 2022-04-03 MED ORDER — HYDROMORPHONE 1 MG/ML IV SOLN
INTRAVENOUS | Status: DC
  Administered 2022-04-03: 30 mg via INTRAVENOUS
  Administered 2022-04-04: 1.5 mg via INTRAVENOUS
  Administered 2022-04-04: 2.5 mg via INTRAVENOUS
  Filled 2022-04-03: qty 30

## 2022-04-03 MED ORDER — SENNOSIDES-DOCUSATE SODIUM 8.6-50 MG PO TABS
1.0000 | ORAL_TABLET | Freq: Two times a day (BID) | ORAL | Status: DC
  Administered 2022-04-05: 1 via ORAL
  Filled 2022-04-03 (×5): qty 1

## 2022-04-03 MED ORDER — OXYCODONE HCL ER 15 MG PO T12A
15.0000 mg | EXTENDED_RELEASE_TABLET | Freq: Two times a day (BID) | ORAL | Status: DC
  Administered 2022-04-03 – 2022-04-05 (×4): 15 mg via ORAL
  Filled 2022-04-03 (×4): qty 1

## 2022-04-03 MED ORDER — CALCITRIOL 0.25 MCG PO CAPS
0.2500 ug | ORAL_CAPSULE | Freq: Every day | ORAL | Status: DC
  Administered 2022-04-04 – 2022-04-05 (×2): 0.25 ug via ORAL
  Filled 2022-04-03 (×2): qty 1

## 2022-04-03 MED ORDER — POLYETHYLENE GLYCOL 3350 17 G PO PACK: 17.0000 g | PACK | Freq: Every day | ORAL | Status: DC | PRN

## 2022-04-03 NOTE — Progress Notes (Signed)
Patient refuses lovenox injections. Also refuses SCD's.  He also declines an Incentive spirometer.

## 2022-04-03 NOTE — ED Triage Notes (Addendum)
Patient has had a sickle cell crisis since Friday. Aching all over his body. Vomiting for 2 days. Took 25 mg oxycontin at 8:30am.

## 2022-04-03 NOTE — TOC Progression Note (Signed)
Transition of Care (TOC) - Progression Note    Patient Details  Name: Kelby Puente. MRN: [id number] Date of Birth: [birth year]  Transition of Care [facility]) CM/SW Contact  Angelita Ingles, RN Phone Number:[phone]  04/03/2022, 3:48 PM  Clinical Narrative:     Transition of Care [facility]) Screening Note   Patient Details  Name: Doreon Osada. Date of Birth: October 20, 1980   Transition of Care [facility]) CM/SW Contact:    Angelita Ingles, RN Phone Number: 04/03/2022, 3:48 PM    Transition of Care Department [facility]) has reviewed patient and no TOC needs have been identified at this time. We will continue to monitor patient advancement through interdisciplinary progression rounds. If new patient transition needs arise, please place a TOC consult.          Expected Discharge Plan and Services                                               Social Determinants of Health (SDOH) Interventions [place]: Food Insecurity Present (04/03/2022)  Housing: Low Risk  (04/03/2022)  Transportation Needs: No Transportation Needs (04/03/2022)  Utilities: Unknown (04/03/2022)  Alcohol Screen: Low Risk  (02/09/2022)  Depression (PHQ2-9): Low Risk  (02/09/2022)  Financial Resource Strain: Low Risk  (02/09/2022)  Physical Activity: Sufficiently Active (02/09/2022)  Social Connections: Socially Isolated (02/09/2022)  Stress: No Stress Concern Present (02/09/2022)  Tobacco Use: High Risk (04/03/2022)    Readmission Risk Interventions    12/27/2021   11:17 AM 10/12/2021   10:41 AM 08/01/2021    2:02 PM  Readmission Risk Prevention Plan  Transportation Screening Complete Complete Complete  Medication Review Press photographer) Complete Complete Complete  PCP or Specialist appointment within 3-5 days of discharge Complete Complete Complete  HRI or Home Care Consult Complete Complete Complete  SW Recovery Care/Counseling Consult Complete Complete Complete   Palliative Care Screening Not Applicable Not Applicable Not [place] Not Applicable Not Applicable Not Applicable

## 2022-04-03 NOTE — H&P (Signed)
H&P  Patient Demographics:  [name] [name], is a 42 y.o. male  MRN: [id number]   DOB - 1980-07-25  Admit Date - 04/03/2022  Outpatient Primary MD for the patient is [name], FNP  Chief Complaint  Patient presents with   Sickle Cell Pain Crisis      HPI:   [name] [name]  is a 42 y.o. male with a medical history significant for sickle cell disease type SS, chronic pain syndrome, opiate dependence and tolerance, chronic marijuana use, chronic kidney disease stage IV, and anemia of chronic disease presents with complaints of bilateral lower extremity and low back pain.  Patient also endorses some dizziness, nausea, and vomiting over the past several days.  Patient reports nausea and vomiting over the past several days.  He lives alone and denies any recent sick contacts.  Patient has not vomited over the past 24 hours, but continues to endorse nausea.  He is not been able to keep any fluids or food down.  Patient also endorses pain to low back and lower extremities that is consistent with his typical sickle cell crisis.  Patient last had oxycodone and OxyContin earlier today without very much relief.  He has not identified any provocative factors concerning crisis.  He denies headache, chest pain, paresthesias, fever, or chills.  No hemoptysis or hematochezia.  No urinary symptoms, constipation.  No recent falls or injury.  No abnormal gait.  No sick contacts, recent travel, or known exposure to COVID-19.   ER course:  While in the ER, patient's vital signs remained stable.BP 117/67 (BP Location: Left Arm)   Pulse 63   Temp 98 F (36.7 C) (Oral)   Resp 14   Ht 5' 3"$  (1.6 m)   Wt 50.8 kg   SpO2 91%   BMI 19.84 kg/m   Complete blood count shows WBCs 13.1, hemoglobin 8.0 g/dL, and platelets 159,000.  Complete metabolic panel shows sodium 136, potassium 3.6, chloride 97, BUN 43, creatinine 4.06, AST 47, ALT 37, and total bilirubin 1.5.  Patient's pain persists despite IV fluids,  and IV Dilaudid.  Patient will be admitted for further management of his sickle cell pain crisis.   Review of systems:  Review of Systems  Constitutional:  Negative for chills and fever.  HENT: Negative.    Eyes:  Negative for blurred vision, double vision and photophobia.  Respiratory:  Negative for sputum production and shortness of breath.   Cardiovascular: Negative.   Gastrointestinal: Negative.   Genitourinary: Negative.   Musculoskeletal:  Positive for back pain and joint pain.  Skin: Negative.   Neurological:  Positive for dizziness. Negative for headaches.  Psychiatric/Behavioral: Negative.       With Past History of the following :   Past Medical History:  Diagnosis Date   Abscess of right iliac fossa 09/24/2018   required Perc Drain 09/24/2018   Arachnoid Cyst of brain bilaterally    "2 really small ones in the back of my head; inside; saw them w/MRI" (09/25/2012)   Bacterial pneumonia ~ 2012   "caught it here in the hospital" (09/25/2012)   Chronic kidney disease    "from my sickle cell" (09/25/2012)   CKD (chronic kidney disease), stage II    Colitis 04/19/2017   CT scan abd/ pelvis   GERD (gastroesophageal reflux disease)    "after I eat alot of spicey foods" (09/25/2012)   Gynecomastia, male 07/10/2012   History of blood transfusion    "always related to sickle cell crisis" (  09/25/2012)   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   Noted in noted from Hematology notes at [facility]    "take RX qd to prevent them" (09/25/2012)   Opioid dependence with withdrawal ([place]) 08/30/2012   Renal insufficiency    Sickle cell anemia (HCC)    Sickle cell crisis ([place]) 09/25/2012   Sickle cell nephropathy ([place]) 07/10/2012   Sinus tachycardia    Tachycardia with heart rate 121-140 beats per minute with ambulation 08/04/2016      Past Surgical History:  Procedure Laterality Date   ABCESS DRAINAGE     CHOLECYSTECTOMY  ~ 2012   COLONOSCOPY N/A 04/23/2017    Procedure: COLONOSCOPY;  Surgeon: Irene Shipper, MD;  [facility];  Service: Endoscopy;  Laterality: N/A;   IR FLUORO GUIDE CV LINE RIGHT  12/17/2016   IR FLUORO GUIDE CV LINE RIGHT  02/07/2021   IR RADIOLOGIST EVAL & MGMT  10/02/2018   IR RADIOLOGIST EVAL & MGMT  10/15/2018   IR REMOVAL TUN CV CATH W/O FL  12/21/2016   IR REMOVAL TUN CV CATH W/O FL  02/11/2021   IR US GUIDE VASC ACCESS RIGHT  12/17/2016   IR US GUIDE VASC ACCESS RIGHT  02/07/2021   spleenectomy       Social History:   Social History   Tobacco Use   Smoking status: Some Days    Packs/day: 0.10    Types: Cigarettes   Smokeless tobacco: Never  Substance Use Topics   Alcohol use: Yes    Comment: Once a month     Lives - At home   Family History :   Family History  Problem Relation Age of Onset   Breast cancer Mother      Home Medications:   Prior to Admission medications   Medication Sig Start Date End Date Taking? Authorizing Provider  amitriptyline (ELAVIL) 25 MG tablet Take 50 mg by mouth at bedtime. 03/08/21   [provider]  calcitRIOL (ROCALTROL) 0.25 MCG capsule Take 0.25 mcg by mouth daily with breakfast. 11/16/20   [provider]  Deferiprone, Twice Daily, (FERRIPROX TWICE-A-DAY) 1000 MG TABS Take 3,000 mg by mouth daily.    [provider]  folic acid (FOLVITE) 1 MG tablet TAKE 1 TABLET BY MOUTH EVERY DAY Patient taking differently: Take 1 mg by mouth every morning. 03/22/20   [name], FNP  hydroxyurea (HYDREA) 500 MG capsule Take 1-2 capsules (500-1,000 mg total) by mouth See admin instructions. Take 500 mg by mouth in the morning on Sun/Tues/Wed/Thurs/Sat and 1,000 mg on Mon/Fri 01/27/22   [name], FNP  oxyCODONE (OXYCONTIN) 15 mg 12 hr tablet Take 1 tablet (15 mg total) by mouth every 12 (twelve) hours. 03/22/22   [name], FNP  Oxycodone HCl 10 MG TABS Take 1 tablet by mouth every 6 hours as needed (pain). 03/21/22   [name], FNP   sodium bicarbonate 650 MG tablet Take 1,950 mg by mouth with breakfast, with lunch, and with evening meal. 11/07/18   [provider]  TYLENOL 8 HOUR ARTHRITIS PAIN 650 MG CR tablet Take 650 mg by mouth every 8 (eight) hours as needed for pain.    [provider]  VELTASSA 16.8 g PACK Take 16.8 g by mouth daily. 10/10/21   [provider]  voxelotor (OXBRYTA) 500 MG TABS tablet Take 1,500 mg by mouth daily with breakfast.    [provider]     Allergies:  Allergies  Allergen Reactions   Nsaids Other (See Comments)    Kidney disease   Morphine And Related Other (See Comments)    "Real bad headaches"      Physical Exam:   Vitals:   Vitals:   04/03/22 1416 04/03/22 1455  BP: 117/67   Pulse: 63   Resp: 18 14  Temp: 98 F (36.7 C)   SpO2: 91%     Physical Exam: Constitutional: Patient appears well-developed and well-nourished. Not in obvious distress. HENT: Normocephalic, atraumatic, External right and left ear normal. Oropharynx is clear and moist.  Eyes: Conjunctivae and EOM are normal. PERRLA, no scleral icterus. Neck: Normal ROM. Neck supple. No JVD. No tracheal deviation. No thyromegaly. CVS: RRR, S1/S2 +, no murmurs, no gallops, no carotid bruit.  Pulmonary: Effort and breath sounds normal, no stridor, rhonchi, wheezes, rales.  Abdominal: Soft. BS +, no distension, tenderness, rebound or guarding.  Musculoskeletal: Normal range of motion. No edema and no tenderness.  Lymphadenopathy: No lymphadenopathy noted, cervical, inguinal or axillary Neuro: Alert. Normal reflexes, muscle tone coordination. No cranial nerve deficit. Skin: Skin is warm and dry. No rash noted. Not diaphoretic. No erythema. No pallor. Psychiatric: Normal mood and affect. Behavior, judgment, thought content normal.   Data Review:   CBC Recent Labs  Lab 04/03/22 1004  WBC 13.1*  HGB 8.0*  HCT 21.8*  PLT 159  MCV 121.1*  MCH 44.4*  MCHC 36.7*  RDW 19.7*   LYMPHSABS 4.8*  MONOABS 0.8  EOSABS 0.2  BASOSABS 0.0   ------------------------------------------------------------------------------------------------------------------  Chemistries  Recent Labs  Lab 04/03/22 1004  NA 136  K 3.6  CL 97*  CO2 26  GLUCOSE 108*  BUN 43*  CREATININE 4.06*  CALCIUM 8.5*  AST 47*  ALT 37  ALKPHOS 145*  BILITOT 1.5*   ------------------------------------------------------------------------------------------------------------------ estimated creatinine clearance is 17.6 mL/min (A) (by C-G formula based on SCr of 4.06 mg/dL (H)). ------------------------------------------------------------------------------------------------------------------ No results for input(s): "TSH", "T4TOTAL", "T3FREE", "THYROIDAB" in the last 72 hours.  Invalid input(s): "FREET3"  Coagulation profile No results for input(s): "INR", "PROTIME" in the last 168 hours. ------------------------------------------------------------------------------------------------------------------- No results for input(s): "DDIMER" in the last 72 hours. -------------------------------------------------------------------------------------------------------------------  Cardiac Enzymes No results for input(s): "CKMB", "TROPONINI", "MYOGLOBIN" in the last 168 hours.  Invalid input(s): "CK" ------------------------------------------------------------------------------------------------------------------ No results found for: "BNP"  ---------------------------------------------------------------------------------------------------------------  Urinalysis    Component Value Date/Time   COLORURINE YELLOW 12/26/2021 2158   APPEARANCEUR CLEAR 12/26/2021 2158   LABSPEC 1.006 12/26/2021 2158   PHURINE 8.0 12/26/2021 2158   GLUCOSEU NEGATIVE 12/26/2021 2158   HGBUR NEGATIVE 12/26/2021 2158   HGBUR small 03/10/2010 [place] 12/26/2021 2158   BILIRUBINUR negative  04/19/2021 1217   BILIRUBINUR neg 09/30/2020 [place] 12/26/2021 2158   PROTEINUR NEGATIVE 12/26/2021 2158   UROBILINOGEN 0.2 04/19/2021 1217   UROBILINOGEN 0.2 05/11/2017 1335   NITRITE NEGATIVE 12/26/2021 2158   LEUKOCYTESUR NEGATIVE 12/26/2021 2158    ----------------------------------------------------------------------------------------------------------------   Imaging Results:    No results found.   Assessment & Plan:  Principal Problem:   Sickle cell pain crisis ([place]) Active Problems:   CKD (chronic kidney disease), stage IV (HCC)   Acute-on-chronic kidney injury ([place])   Leukocytosis  Sickle cell disease with pain crisis: Admit patient.  Initiate IV Dilaudid PCA with settings of 0.5 mg, 10-minute lockout, and 3 mg/h. Will hold oxycodone and restart as pain intensity improves Continue OxyContin 15 mg every 12 hours No Toradol due to  chronic kidney disease IV fluids, 0.45% saline at 150 mL/h Monitor vital signs very closely, reevaluate pain scale regularly, and supplemental oxygen as needed.  Anemia of chronic disease: Hemoglobin is 8.0 g/dL, which is consistent with his baseline of 8-9 g/dL.  Patient is typically not transfused unless hemoglobin is less than 7.0 g/dL.  Will repeat CBC and type and screen in AM.  Please defer to sickle cell team for any nonemergent blood transfusions.  Acute on chronic kidney disease: Patient has a history of stage IV chronic kidney disease and is followed by nephrology as an outpatient.  Today, his creatinine is above his baseline at 4.06.  Patient's baseline is 3.2-3.5.  Will continue fluid resuscitation.  Avoid all nephrotoxins.  Labs in AM.  Leukocytosis: Mild leukocytosis.  More than likely secondary to sickle cell disease.  Will continue to monitor closely.  Nausea/vomiting: Advance diet as tolerated.  Continue antiemetics as needed  DVT Prophylaxis: Subcut Lovenox and SCDs  AM Labs Ordered, also please  review Full Orders  Family Communication: Admission, patient's condition and plan of care including tests being ordered have been discussed with the patient who indicate understanding and agree with the plan and Code Status.  Code Status: Full Code  Consults called: None    Admission status: Inpatient    Time spent in minutes : 50 minutes  Red Willow, MSN, FNP-C [facility] [address] [place], [place] [address] [phone]  04/03/2022 at 4:57 PM

## 2022-04-03 NOTE — ED Notes (Signed)
ED TO INPATIENT HANDOFF REPORT  ED Nurse Name and Phone #: Hebert Soho, RN [phone]  S Name/Age/Gender [name] [name]. 42 y.o. male Room/Bed: WA24/WA24  Code Status   Code Status: Full Code  Home/SNF/Other Home Patient oriented to: self, place, time, and situation Is this baseline? Yes   Triage Complete: Triage complete  Chief Complaint Sickle cell pain crisis [facility]) [D57.00]  Triage Note Patient has had a sickle cell crisis since Friday. Aching all over his body. Vomiting for 2 days. Took 25 mg oxycontin at 8:30am.   Allergies Allergies  Allergen Reactions   Nsaids Other (See Comments)    Kidney disease   Morphine And Related Other (See Comments)    "Real bad headaches"     Level of Care/Admitting Diagnosis ED Disposition     ED Disposition  Admit   Condition  --   Comment  Hospital Area: [facility] [[id number]]  Level of Care: Med-Surg [[id number]]  May admit patient to [facility] or [facility] if equivalent level of care is available:: No  Covid Evaluation: Asymptomatic - no recent exposure (last 10 days) testing not required  Diagnosis: Sickle cell pain crisis [facility]) [id number]  Admitting Physician: [name] [id number]  Attending Physician: [name] [id number]  Certification:: I certify this patient will need inpatient services for at least 2 midnights  Estimated Length of Stay: 2          B Medical/Surgery History Past Medical History:  Diagnosis Date   Abscess of right iliac fossa 09/24/2018   required Perc Drain 09/24/2018   Arachnoid Cyst of brain bilaterally    "2 really small ones in the back of my head; inside; saw them w/MRI" (09/25/2012)   Bacterial pneumonia ~ 2012   "caught it here in the hospital" (09/25/2012)   Chronic kidney disease    "from my sickle cell" (09/25/2012)   CKD (chronic kidney disease), stage II    Colitis 04/19/2017   CT scan abd/ pelvis   GERD (gastroesophageal reflux disease)    "after I  eat alot of spicey foods" (09/25/2012)   Gynecomastia, male 07/10/2012   History of blood transfusion    "always related to sickle cell crisis" (09/25/2012)   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   Noted in noted from Hematology notes at [facility]    "take RX qd to prevent them" (09/25/2012)   Opioid dependence with withdrawal ([place]) 08/30/2012   Renal insufficiency    Sickle cell anemia ([place])    Sickle cell crisis ([place]) 09/25/2012   Sickle cell nephropathy ([place]) 07/10/2012   Sinus tachycardia    Tachycardia with heart rate 121-140 beats per minute with ambulation 08/04/2016   Past Surgical History:  Procedure Laterality Date   ABCESS DRAINAGE     CHOLECYSTECTOMY  ~ 2012   COLONOSCOPY N/A 04/23/2017   Procedure: COLONOSCOPY;  Surgeon: Irene Shipper, MD;  [facility];  Service: Endoscopy;  Laterality: N/A;   IR FLUORO GUIDE CV LINE RIGHT  12/17/2016   IR FLUORO GUIDE CV LINE RIGHT  02/07/2021   IR RADIOLOGIST EVAL & MGMT  10/02/2018   IR RADIOLOGIST EVAL & MGMT  10/15/2018   IR REMOVAL TUN CV CATH W/O FL  12/21/2016   IR REMOVAL TUN CV CATH W/O FL  02/11/2021   IR US GUIDE VASC ACCESS RIGHT  12/17/2016   IR US GUIDE VASC ACCESS RIGHT  02/07/2021   spleenectomy  A IV Location/Drains/Wounds Patient Lines/Drains/Airways Status     Active Line/Drains/Airways     Name Placement date Placement time Site Days   Peripheral IV 04/03/22 22 G Left Hand 04/03/22  1007  Hand  less than 1            Intake/Output Last 24 hours  Intake/Output Summary (Last 24 hours) at 04/03/2022 1234 Last data filed at 04/03/2022 1226 Gross per 24 hour  Intake 1312.96 ml  Output --  Net 1312.96 ml    Labs/Imaging Results for orders placed or performed during the hospital encounter of 04/03/22 (from the past 48 hour(s))  Reticulocytes     Status: Abnormal   Collection Time: 04/03/22 10:04 AM  Result Value Ref Range   Retic Ct Pct 6.1 (H) 0.4 - 3.1 %   RBC. 1.81  (L) 4.22 - 5.81 MIL/uL   Retic Count, Absolute 110.6 19.0 - 186.0 K/uL   Immature Retic Fract 31.1 (H) 2.3 - 15.9 %    Comment: Performed at [facility], [place] [address], [place], [place] [address]  CBC WITH DIFFERENTIAL     Status: Abnormal   Collection Time: 04/03/22 10:04 AM  Result Value Ref Range   WBC 13.1 (H) 4.0 - 10.5 K/uL    Comment: WHITE COUNT CONFIRMED ON SMEAR   RBC 1.80 (L) 4.22 - 5.81 MIL/uL   Hemoglobin 8.0 (L) 13.0 - 17.0 g/dL   HCT 21.8 (L) 39.0 - 52.0 %   MCV 121.1 (H) 80.0 - 100.0 fL   MCH 44.4 (H) 26.0 - 34.0 pg   MCHC 36.7 (H) 30.0 - 36.0 g/dL   RDW 19.7 (H) 11.5 - 15.5 %   Platelets 159 150 - 400 K/uL   nRBC 0.3 (H) 0.0 - 0.2 %   Neutrophils Relative % 57 %   Neutro Abs 7.3 1.7 - 7.7 K/uL   Lymphocytes Relative 36 %   Lymphs Abs 4.8 (H) 0.7 - 4.0 K/uL   Monocytes Relative 6 %   Monocytes Absolute 0.8 0.1 - 1.0 K/uL   Eosinophils Relative 1 %   Eosinophils Absolute 0.2 0.0 - 0.5 K/uL   Basophils Relative 0 %   Basophils Absolute 0.0 0.0 - 0.1 K/uL   Immature Granulocytes 0 %   Abs Immature Granulocytes 0.04 0.00 - 0.07 K/uL   Tammy Sours Bodies PRESENT    Polychromasia PRESENT    Sickle Cells MARKED    Target Cells PRESENT     Comment: Performed at [facility], [place] [address], [place], [place] [address]  Comprehensive metabolic panel     Status: Abnormal   Collection Time: 04/03/22 10:04 AM  Result Value Ref Range   Sodium 136 135 - 145 mmol/L   Potassium 3.6 3.5 - 5.1 mmol/L   Chloride 97 (L) 98 - 111 mmol/L   CO2 26 22 - 32 mmol/L   Glucose, Bld 108 (H) 70 - 99 mg/dL    Comment: Glucose reference range applies only to samples taken after fasting for at least 8 hours.   BUN 43 (H) 6 - 20 mg/dL   Creatinine, Ser 4.06 (H) 0.61 - 1.24 mg/dL   Calcium 8.5 (L) 8.9 - 10.3 mg/dL   Total Protein 7.4 6.5 - 8.1 g/dL   Albumin 3.3 (L) 3.5 - 5.0 g/dL   AST 47 (H) 15 - 41 U/L   ALT 37 0 - 44 U/L   Alkaline  Phosphatase 145 (H) 38 - 126 U/L  Total Bilirubin 1.5 (H) 0.3 - 1.2 mg/dL   GFR, Estimated 18 (L) >60 mL/min    Comment: (NOTE) Calculated using the CKD-EPI Creatinine Equation (2021)    Anion gap 13 5 - 15    Comment: Performed at [facility], [place] [address], [place], [place] [address]   *Note: Due to a large number of results and/or encounters for the requested time period, some results have not been displayed. A complete set of results can be found in Results Review.   No results found.  Pending Labs [employer] (From admission, onward)     Start     Ordered   Signed and Corporate treasurer  Daily,   R      Signed and Held   Signed and Held  CBC  Daily,   R      Signed and Held   Signed and Held  Urinalysis, Routine w reflex microscopic -Urine, Clean Catch  Tomorrow morning,   R       Question:  Specimen Source  Answer:  Urine, Clean Catch   Signed and Held            Vitals/Pain Today's Vitals   04/03/22 1100 04/03/22 1108 04/03/22 1212 04/03/22 1213  BP:      Pulse:      Resp:      Temp:      TempSrc:      SpO2:      PainSc: 6  6  4  4     $ Isolation Precautions No active isolations  Medications Medications  ondansetron (ZOFRAN) injection 4 mg (4 mg Intravenous Given 04/03/22 1008)  0.45 % sodium chloride infusion (has no administration in time range)  HYDROmorphone (DILAUDID) injection 2 mg (has no administration in time range)  sodium chloride 0.9 % bolus 1,000 mL (0 mLs Intravenous Stopped 04/03/22 1139)  HYDROmorphone (DILAUDID) injection 2 mg (2 mg Intravenous Given 04/03/22 1009)  HYDROmorphone (DILAUDID) injection 2 mg (2 mg Intravenous Given 04/03/22 1043)  HYDROmorphone (DILAUDID) injection 2 mg (2 mg Intravenous Given 04/03/22 1139)  diphenhydrAMINE (BENADRYL) 12.5 mg in sodium chloride 0.9 % 50 mL IVPB (0 mg Intravenous Stopped 04/03/22 1100)    Mobility walks     Focused Assessments Neuro Assessment  Handoff:  Swallow screen pass?  N/A         Neuro Assessment:   Neuro Checks:      Has TPA been given? No If patient is a Neuro Trauma and patient is going to OR before floor call report to [place] nurse: [phone] or [phone]   R Recommendations: See Admitting Provider Note  Report given to:   Additional Notes:

## 2022-04-03 NOTE — Telephone Encounter (Signed)
Patient called requesting to come to the day hospital. Patient reports "very dark urine", feeling dizzy, light-headed and throwing up over the weekend. Patient also reports fast heart rate and chronic back pain. [place], [place] notified and advised that patient go to the ED for work-up. The day hospital is unable to work patient up for these symptoms. Patient advised and expresses an understanding.

## 2022-04-03 NOTE — ED Provider Notes (Signed)
[place] EMERGENCY DEPARTMENT AT [facility] Provider Note   CSN: [id number] Arrival date & time: 04/03/22  0930     History  Chief Complaint  Patient presents with   Sickle Cell Pain Crisis    [name] Normil. is a 42 y.o. male.  42 year old male with history of sickle disease presents with nausea vomiting and pain crisis.  Patient states that his emesis has been nonbilious or bloody.  With acute on his oral medications today.  No states that dark urine.  Concern for possible dehydration.  Denies any fever or chills.  No chest pain.  States that he has had his usual pain crisis pain for several days.  Called his doctor and told to come here       Home Medications Prior to Admission medications   Medication Sig Start Date End Date Taking? Authorizing Provider  amitriptyline (ELAVIL) 25 MG tablet Take 50 mg by mouth at bedtime. 03/08/21   [provider]  calcitRIOL (ROCALTROL) 0.25 MCG capsule Take 0.25 mcg by mouth daily with breakfast. 11/16/20   [provider]  Deferiprone, Twice Daily, (FERRIPROX TWICE-A-DAY) 1000 MG TABS Take 3,000 mg by mouth daily.    [provider]  folic acid (FOLVITE) 1 MG tablet TAKE 1 TABLET BY MOUTH EVERY DAY Patient taking differently: Take 1 mg by mouth every morning. 03/22/20   [name], FNP  hydroxyurea (HYDREA) 500 MG capsule Take 1-2 capsules (500-1,000 mg total) by mouth See admin instructions. Take 500 mg by mouth in the morning on Sun/Tues/Wed/Thurs/Sat and 1,000 mg on Mon/Fri 01/27/22   [name], FNP  oxyCODONE (OXYCONTIN) 15 mg 12 hr tablet Take 1 tablet (15 mg total) by mouth every 12 (twelve) hours. 03/22/22   [name], FNP  Oxycodone HCl 10 MG TABS Take 1 tablet by mouth every 6 hours as needed (pain). 03/21/22   [name], FNP  sodium bicarbonate 650 MG tablet Take 1,950 mg by mouth with breakfast, with lunch, and with evening meal. 11/07/18   [provider]  TYLENOL 8 HOUR ARTHRITIS PAIN 650 MG CR tablet Take 650 mg by mouth every 8 (eight) hours as needed for pain.    [provider]  VELTASSA 16.8 g PACK Take 16.8 g by mouth daily. 10/10/21   [provider]  voxelotor (OXBRYTA) 500 MG TABS tablet Take 1,500 mg by mouth daily with breakfast.    [provider]      Allergies    Nsaids and Morphine and related    Review of Systems   Review of Systems  All other systems reviewed and are negative.   Physical Exam Updated Vital Signs BP 109/77 (BP Location: Left Arm)   Pulse (!) 104   Temp 99 F (37.2 C) (Oral)   Resp 14   SpO2 95%  Physical Exam Vitals and nursing note reviewed.  Constitutional:      General: He is not in acute distress.    Appearance: Normal appearance. He is well-developed. He is not toxic-appearing.  HENT:     Head: Normocephalic and atraumatic.  Eyes:     General: Lids are normal.     Conjunctiva/sclera: Conjunctivae normal.     Pupils: Pupils are equal, round, and reactive to light.  Neck:     Thyroid: No thyroid mass.     Trachea: No tracheal deviation.  Cardiovascular:     Rate and Rhythm: Normal rate and regular rhythm.  Heart sounds: Normal heart sounds. No murmur heard.    No gallop.  Pulmonary:     Effort: Pulmonary effort is normal. No respiratory distress.     Breath sounds: Normal breath sounds. No stridor. No decreased breath sounds, wheezing, rhonchi or rales.  Abdominal:     General: There is no distension.     Palpations: Abdomen is soft.     Tenderness: There is no abdominal tenderness. There is no rebound.  Musculoskeletal:        General: No tenderness. Normal range of motion.     Cervical back: Normal range of motion and neck supple.  Skin:    General: Skin is warm and dry.     Findings: No abrasion or rash.  Neurological:     Mental Status: He is alert and oriented to person, place, and time. Mental status is at baseline.     GCS: GCS eye  subscore is 4. GCS verbal subscore is 5. GCS motor subscore is 6.     Cranial Nerves: No cranial nerve deficit.     Sensory: No sensory deficit.     Motor: Motor function is intact.  Psychiatric:        Attention and Perception: Attention normal.        Speech: Speech normal.        Behavior: Behavior normal.     ED Results / Procedures / Treatments   Labs (all labs ordered are listed, but only abnormal results are displayed) Labs Reviewed  RETICULOCYTES  CBC WITH DIFFERENTIAL/PLATELET  COMPREHENSIVE METABOLIC PANEL    EKG None  Radiology No results found.  Procedures Procedures    Medications Ordered in ED Medications  sodium chloride 0.9 % bolus 1,000 mL (has no administration in time range)  0.9 %  sodium chloride infusion (has no administration in time range)  HYDROmorphone (DILAUDID) injection 2 mg (has no administration in time range)  HYDROmorphone (DILAUDID) injection 2 mg (has no administration in time range)  HYDROmorphone (DILAUDID) injection 2 mg (has no administration in time range)  diphenhydrAMINE (BENADRYL) 12.5 mg in sodium chloride 0.9 % 50 mL IVPB (has no administration in time range)  ondansetron (ZOFRAN) injection 4 mg (has no administration in time range)    ED Course/ Medical Decision Making/ A&P                             Medical Decision Making Amount and/or Complexity of Data Reviewed Labs: ordered.  Risk Prescription drug management.   Patient given a rehydration here along with opiates and antiemetics.  Patient's labs reviewed and shows dehydration.  No concern for acute chest crisis.  Hemoglobin is stable at 8.  No concern for aplastic crisis.  His reticulocyte count is up at this time.  Patient continues to be uncomfortable despite multiple rounds of IV opiate medications.  Discussed with sickle cell team and they will admit patient  CRITICAL CARE Performed by: Leota Jacobsen Total critical care time: 50 minutes Critical care  time was exclusive of separately billable procedures and treating other patients. Critical care was necessary to treat or prevent imminent or life-threatening deterioration. Critical care was time spent personally by me on the following activities: development of treatment plan with patient and/or surrogate as well as nursing, discussions with consultants, evaluation of patient's response to treatment, examination of patient, obtaining history from patient or surrogate, ordering and performing treatments and interventions, ordering and review of  laboratory studies, ordering and review of radiographic studies, pulse oximetry and re-evaluation of patient's condition.         Final Clinical Impression(s) / ED Diagnoses Final diagnoses:  None    Rx / DC Orders ED Discharge Orders     None         Lacretia Leigh, MD 04/03/22 1215

## 2022-04-04 DIAGNOSIS — D5700 Hb-SS disease with crisis, unspecified: Secondary | ICD-10-CM | POA: Diagnosis not present

## 2022-04-04 LAB — URINALYSIS, ROUTINE W REFLEX MICROSCOPIC
Bacteria, UA: NONE SEEN
Bilirubin Urine: NEGATIVE
Glucose, UA: 50 mg/dL — AB
Hgb urine dipstick: NEGATIVE
Ketones, ur: NEGATIVE mg/dL
Leukocytes,Ua: NEGATIVE
Nitrite: NEGATIVE
Protein, ur: NEGATIVE mg/dL
Specific Gravity, Urine: 1.008 (ref 1.005–1.030)
pH: 8 (ref 5.0–8.0)

## 2022-04-04 LAB — BASIC METABOLIC PANEL
Anion gap: 9 (ref 5–15)
BUN: 32 mg/dL — ABNORMAL HIGH (ref 6–20)
CO2: 22 mmol/L (ref 22–32)
Calcium: 8.3 mg/dL — ABNORMAL LOW (ref 8.9–10.3)
Chloride: 103 mmol/L (ref 98–111)
Creatinine, Ser: 3.36 mg/dL — ABNORMAL HIGH (ref 0.61–1.24)
GFR, Estimated: 23 mL/min — ABNORMAL LOW (ref >60)
Glucose, Bld: 89 mg/dL (ref 70–99)
Potassium: 4.4 mmol/L (ref 3.5–5.1)
Sodium: 134 mmol/L — ABNORMAL LOW (ref 135–145)

## 2022-04-04 LAB — CBC
HCT: 18.6 % — ABNORMAL LOW (ref 39.0–52.0)
Hemoglobin: 6.6 g/dL — CL (ref 13.0–17.0)
MCH: 44.3 pg — ABNORMAL HIGH (ref 26.0–34.0)
MCHC: 35.5 g/dL (ref 30.0–36.0)
MCV: 124.8 fL — ABNORMAL HIGH (ref 80.0–100.0)
Platelets: 118 10*3/uL — ABNORMAL LOW (ref 150–400)
RBC: 1.49 MIL/uL — ABNORMAL LOW (ref 4.22–5.81)
RDW: 19.8 % — ABNORMAL HIGH (ref 11.5–15.5)
WBC: 14.4 10*3/uL — ABNORMAL HIGH (ref 4.0–10.5)
nRBC: 0.3 % — ABNORMAL HIGH (ref 0.0–0.2)

## 2022-04-04 LAB — LACTATE DEHYDROGENASE: LDH: 158 U/L (ref 98–192)

## 2022-04-04 LAB — PREPARE RBC (CROSSMATCH)

## 2022-04-04 MED ORDER — SODIUM CHLORIDE 0.9% IV SOLUTION: Freq: Once | INTRAVENOUS | Status: AC

## 2022-04-04 MED ORDER — ACETAMINOPHEN 325 MG PO TABS
650.0000 mg | ORAL_TABLET | Freq: Once | ORAL | Status: AC
  Administered 2022-04-04: 650 mg via ORAL
  Filled 2022-04-04: qty 2

## 2022-04-04 MED ORDER — HYDROMORPHONE 1 MG/ML IV SOLN
INTRAVENOUS | Status: DC
  Administered 2022-04-04: 30 mg via INTRAVENOUS
  Administered 2022-04-05: 4 mg via INTRAVENOUS
  Filled 2022-04-04: qty 30

## 2022-04-04 MED ORDER — OXYCODONE HCL 5 MG PO TABS
10.0000 mg | ORAL_TABLET | Freq: Four times a day (QID) | ORAL | Status: DC | PRN
  Administered 2022-04-04: 10 mg via ORAL
  Filled 2022-04-04 (×2): qty 2

## 2022-04-04 MED ORDER — DIPHENHYDRAMINE HCL 50 MG/ML IJ SOLN
25.0000 mg | Freq: Once | INTRAMUSCULAR | Status: AC
  Administered 2022-04-04: 25 mg via INTRAVENOUS
  Filled 2022-04-04: qty 1

## 2022-04-04 NOTE — Progress Notes (Signed)
Contacted [place] regarding the patient's refusal to send medications home or to get his mom to come pick up his medications.  [place] recommended that we encourage patient to have his mother to come pick up his medications, or to proceed with following the hospital policy.  Even after further education, patient getting loud with Probation officer and still refusing.  AC contacted and security to come to re-educate the patient.  Patient stated he would be agreeable if he saw the policy.  He has now agreed that his mom will come to pick up the medications within an hour to take them home.

## 2022-04-04 NOTE — Progress Notes (Signed)
Notified by primary RN that this patient brought home medications to hospital. I spoke with patient to educate him on the policy regarding home medications. I stated to patient that home medications are to be counted with him and an RN, to confirm count, and then sealed and walked down to the pharmacy for dispense and administration. Patient stated that he understood the policy but he would not be allowing staff to count his medications or walk them down to pharmacy. Patient also refused for anyone to come pick up his medications. Patient stated :these medicines ain't bothering nobody. They staying right here."  [facility], and attending NP have been notified.

## 2022-04-04 NOTE — Progress Notes (Signed)
   04/04/22 0830  Provider Notification  Provider Name/Title [place] Hollis NP  Date Provider Notified 04/04/22  Time Provider Notified 0801  Method of Notification Page  Notification Reason Critical Result (Hgb 6.6)  Provider response See new orders  Date of Provider Response 04/04/22  Time of Provider Response [phone]

## 2022-04-04 NOTE — Progress Notes (Signed)
Subjective: [name] [name] is a 42 year old male with a medical history significant for sickle cell disease, chronic pain syndrome, stage IV chronic kidney disease, opiate dependence and tolerance, and anemia of chronic disease was admitted for sickle cell pain crisis. Today, patient's hemoglobin decreased below baseline at 6.6 g/dL.  Patient denies any shortness of breath or dizziness.  But does report some fatigue. He states that pain intensity has improved some overnight.  He rates his pain as 7/10. Patient denies headache, chest pain, urinary symptoms, nausea, vomiting, or diarrhea.  Objective:  Vital signs in last 24 hours:  Vitals:   04/04/22 0113 04/04/22 0315 04/04/22 1007 04/04/22 1244  BP:  105/77 (!) 94/58   Pulse:  90 96   Resp: 15 18 18   $ Temp:  98.2 F (36.8 C) 98.5 F (36.9 C)   TempSrc:  Oral Oral   SpO2:  97% 96% 97%  Weight:      Height:        Intake/Output from previous day:   Intake/Output Summary (Last 24 hours) at 04/04/2022 1334 Last data filed at 04/04/2022 0840 Gross per 24 hour  Intake 60 ml  Output 1800 ml  Net -1740 ml    Physical Exam: General: Alert, awake, oriented x3, in no acute distress.  HEENT: [place]/AT PEERL, EOMI Neck: Trachea midline,  no masses, no thyromegal,y no JVD, no carotid bruit OROPHARYNX:  Moist, No exudate/ erythema/lesions.  Heart: Regular rate and rhythm, without murmurs, rubs, gallops, PMI non-displaced, no heaves or thrills on palpation.  Lungs: Clear to auscultation, no wheezing or rhonchi noted. No increased vocal fremitus resonant to percussion  Abdomen: Soft, nontender, nondistended, positive bowel sounds, no masses no hepatosplenomegaly noted..  Neuro: No focal neurological deficits noted cranial nerves II through XII grossly intact. DTRs 2+ bilaterally upper and lower extremities. Strength 5 out of 5 in bilateral upper and lower extremities. Musculoskeletal: No warm swelling or erythema around joints, no spinal  tenderness noted. Psychiatric: Patient alert and oriented x3, good insight and cognition, good recent to remote recall. Lymph node survey: No cervical axillary or inguinal lymphadenopathy noted.  Lab Results:  Basic Metabolic Panel:    Component Value Date/Time   NA 134 (L) 04/04/2022 0709   NA 137 01/24/2022 1224   K 4.4 04/04/2022 0709   CL 103 04/04/2022 0709   CO2 22 04/04/2022 0709   BUN 32 (H) 04/04/2022 0709   BUN 35 (H) 01/24/2022 1224   CREATININE 3.36 (H) 04/04/2022 0709   CREATININE 1.45 (H) 05/12/2014 1430   GLUCOSE 89 04/04/2022 0709   CALCIUM 8.3 (L) 04/04/2022 0709   CBC:    Component Value Date/Time   WBC 14.4 (H) 04/04/2022 0709   HGB 6.6 (LL) 04/04/2022 0709   HGB 9.2 (L) 10/25/2021 1125   HCT 18.6 (L) 04/04/2022 0709   HCT 25.1 (L) 10/25/2021 1125   PLT 118 (L) 04/04/2022 0709   PLT 194 10/25/2021 1125   MCV 124.8 (H) 04/04/2022 0709   MCV 107 (H) 10/25/2021 1125   NEUTROABS 7.3 04/03/2022 1004   NEUTROABS 6.6 10/25/2021 1125   LYMPHSABS 4.8 (H) 04/03/2022 1004   LYMPHSABS 5.7 (H) 10/25/2021 1125   MONOABS 0.8 04/03/2022 1004   EOSABS 0.2 04/03/2022 1004   EOSABS 0.4 10/25/2021 1125   BASOSABS 0.0 04/03/2022 1004   BASOSABS 0.1 10/25/2021 1125    No results found for this or any previous visit (from the past 240 hour(s)).  Studies/Results: No results found.  Medications: Scheduled Meds:  sodium chloride   Intravenous Once   acetaminophen  650 mg Oral Once   amitriptyline  50 mg Oral QHS   calcitRIOL  0.25 mcg Oral Q breakfast   Deferiprone (Twice Daily)  2,000 mg Oral BID   diphenhydrAMINE  25 mg Intravenous Once   enoxaparin (LOVENOX) injection  30 mg Subcutaneous [id number]   folic acid  1 mg Oral Daily   HYDROmorphone   Intravenous Q4H   hydroxyurea  500 mg Oral Once per day on Sun Tue Wed Thu Sat   And   [START ON 04/07/2022] hydroxyurea  1,000 mg Oral Once per day on Mon Fri   oxyCODONE  15 mg Oral Q12H   senna-docusate  1 tablet Oral  BID   sodium bicarbonate  1,950 mg Oral TID with meals   voxelotor  1,500 mg Oral Q breakfast   Continuous Infusions:  sodium chloride 50 mL/hr at 04/04/22 0833   PRN Meds:.diphenhydrAMINE, naloxone **AND** sodium chloride flush, ondansetron, ondansetron (ZOFRAN) IV, oxyCODONE, polyethylene glycol  Consultants: none  Procedures: none  Antibiotics: none  Assessment/Plan: Principal Problem:   Sickle cell pain crisis ([place]) Active Problems:   CKD (chronic kidney disease), stage IV (HCC)   Acute-on-chronic kidney injury ([place])   Leukocytosis  Sickle cell disease with pain crisis: Weaning IV Dilaudid PCA Restart home medications oxycodone 10 mg every 6 hours as needed for severe breakthrough pain OxyContin 15 mg every 12 hours No Toradol, stage IV chronic kidney disease Decrease IV fluids to 0.45% saline at 50 mL/h Monitor vital signs very closely, reevaluate pain scale regularly, and supplemental oxygen as needed  Acute on chronic kidney disease: Creatinine trending towards baseline.  Continue gentle hydration.  Avoid all nephrotoxins.  Anemia of chronic disease: Hemoglobin 6.6 g/dL.  Patient's baseline is 8-9 g/dL.  Transfuse 1 unit PRBCs today.  Monitor closely.  Follow labs in AM.  Leukocytosis: Stable.  Patient afebrile.  No signs of active infection.  Continue to monitor closely without antibiotics.  Labs in AM.  Nausea/vomiting: Resolved.  Continue to watch closely.   Code Status: Full Code Family Communication: N/A Disposition Plan: Not yet ready for discharge  [place], MSN, FNP-C Patient [place] [address] [place], [place] [address] [phone]  If 7PM-7AM, please contact night-coverage.  04/04/2022, 1:34 PM  LOS: 1 day

## 2022-04-05 DIAGNOSIS — D5700 Hb-SS disease with crisis, unspecified: Secondary | ICD-10-CM | POA: Diagnosis not present

## 2022-04-05 LAB — BASIC METABOLIC PANEL
Anion gap: 11 (ref 5–15)
BUN: 29 mg/dL — ABNORMAL HIGH (ref 6–20)
CO2: 21 mmol/L — ABNORMAL LOW (ref 22–32)
Calcium: 8.9 mg/dL (ref 8.9–10.3)
Chloride: 106 mmol/L (ref 98–111)
Creatinine, Ser: 3.16 mg/dL — ABNORMAL HIGH (ref 0.61–1.24)
GFR, Estimated: 25 mL/min — ABNORMAL LOW (ref >60)
Glucose, Bld: 91 mg/dL (ref 70–99)
Potassium: 4.5 mmol/L (ref 3.5–5.1)
Sodium: 138 mmol/L (ref 135–145)

## 2022-04-05 LAB — BPAM RBC
Blood Product Expiration Date: 202403082359
ISSUE DATE / TIME: 202402201605
Unit Type and Rh: 5100

## 2022-04-05 LAB — CBC
HCT: 26.7 % — ABNORMAL LOW (ref 39.0–52.0)
Hemoglobin: 9.6 g/dL — ABNORMAL LOW (ref 13.0–17.0)
MCH: 40.9 pg — ABNORMAL HIGH (ref 26.0–34.0)
MCHC: 36 g/dL (ref 30.0–36.0)
MCV: 113.6 fL — ABNORMAL HIGH (ref 80.0–100.0)
Platelets: 162 10*3/uL (ref 150–400)
RBC: 2.35 MIL/uL — ABNORMAL LOW (ref 4.22–5.81)
WBC: 12.7 10*3/uL — ABNORMAL HIGH (ref 4.0–10.5)
nRBC: 0.3 % — ABNORMAL HIGH (ref 0.0–0.2)

## 2022-04-05 LAB — TYPE AND SCREEN
ABO/RH(D): O POS
Antibody Screen: NEGATIVE
Donor AG Type: NEGATIVE
Unit division: 0

## 2022-04-05 MED ORDER — ACETAMINOPHEN 325 MG PO TABS
650.0000 mg | ORAL_TABLET | Freq: Once | ORAL | Status: AC
  Administered 2022-04-05: 650 mg via ORAL
  Filled 2022-04-05: qty 2

## 2022-04-05 NOTE — Discharge Summary (Signed)
Physician Discharge Summary  [name] [name]. [id number] DOB: [birth year] DOA: 04/03/2022  PCP: [name], FNP  Admit date: 04/03/2022  Discharge date: 04/05/2022  Discharge Diagnoses:  Principal Problem:   Sickle cell pain crisis ([place]) Active Problems:   CKD (chronic kidney disease), stage IV ([place])   Acute-on-chronic kidney injury ([place])   Leukocytosis   Discharge Condition: Stable  Disposition:   Follow-up Information     [name], FNP Follow up.   Specialty: Family Medicine Contact information: [place]. [place] [id number] [phone]                Pt is discharged home in good condition and is to follow up with [name], FNP this week to have labs evaluated. [name] is instructed to increase activity slowly and balance with rest for the next few days, and use prescribed medication to complete treatment of pain  Diet: Regular Wt Readings from Last 3 Encounters:  04/03/22 50.8 kg  02/09/22 54.9 kg  01/24/22 54.2 kg    History of present illness:   Grabiel [name]  is a 42 y.o. male with a medical history significant for sickle cell disease type SS, chronic pain syndrome, opiate dependence and tolerance, chronic marijuana use, chronic kidney disease stage IV, and anemia of chronic disease presents with complaints of bilateral lower extremity and low back pain.  Patient also endorses some dizziness, nausea, and vomiting over the past several days.  Patient reports nausea and vomiting over the past several days.  He lives alone and denies any recent sick contacts.  Patient has not vomited over the past 24 hours, but continues to endorse nausea.  He is not been able to keep any fluids or food down.  Patient also endorses pain to low back and lower extremities that is consistent with his typical sickle cell crisis.  Patient last had oxycodone and OxyContin earlier today without very much relief.  He has  not identified any provocative factors concerning crisis.  He denies headache, chest pain, paresthesias, fever, or chills.  No hemoptysis or hematochezia.  No urinary symptoms, constipation.  No recent falls or injury.  No abnormal gait.  No sick contacts, recent travel, or known exposure to COVID-19.     ER course:   While in the ER, patient's vital signs remained stable.BP 117/67 (BP Location: Left Arm)   Pulse 63   Temp 98 F (36.7 C) (Oral)   Resp 14   Ht 5' 3"$  (1.6 m)   Wt 50.8 kg   SpO2 91%   BMI 19.84 kg/m    Complete blood count shows WBCs 13.1, hemoglobin 8.0 g/dL, and platelets 159,000.  Complete metabolic panel shows sodium 136, potassium 3.6, chloride 97, BUN 43, creatinine 4.06, AST 47, ALT 37, and total bilirubin 1.5.  Patient's pain persists despite IV fluids, and IV Dilaudid.  Patient will be admitted for further management of his sickle cell pain crisis. Hospital Course:  Sickle cell disease with pain crisis: Patient was admitted for sickle cell pain crisis and managed appropriately with IVF, IV Dilaudid via PCA, as well as other adjunct therapies per sickle cell pain management protocols.  Today, patient's pain intensity is 2/10 and he is requesting discharge home.  He feels that he can manage at home with his home medications.  He will follow-up with PCP for medication management.  Anemia of chronic disease: Patient's hemoglobin decreased to 6.6 g/dL, which is below  his baseline.  He was transfused 1 unit PRBCs without complications.  Prior to discharge, patient's hemoglobin 9.6 g/dL.  Patient will follow-up in 1 week for CBC with differential. Also, patient advised to continue follow-up with his hematology team at [facility] as scheduled. Patient will resume both Oxbryta and hydroxyurea  Acute on chronic kidney disease stage IV: On admission, creatinine was above patient's baseline at 4.06, prior to discharge creatinine improved to 3.16.  Patient will continue to follow-up  with nephrology as scheduled.  Will return to clinic in 1 week to repeat CMP.  Patient is alert, oriented, and ambulating without assistance.  He denies any vomiting or diarrhea today.  Patient has resumed eating.  Patient was therefore discharged home today in a hemodynamically stable condition.   Juwan will follow-up with PCP within 1 week of this discharge. [place] was counseled extensively about nonpharmacologic means of pain management, patient verbalized understanding and was appreciative of  the care received during this admission.   We discussed the need for good hydration, monitoring of hydration status, avoidance of heat, cold, stress, and infection triggers. We discussed the need to be adherent with taking Hydrea and other home medications. Patient was reminded of the need to seek medical attention immediately if any symptom of bleeding, anemia, or infection occurs.  Discharge Exam: Vitals:   04/05/22 0645 04/05/22 0746  BP: 126/75   Pulse: 99   Resp: 18 16  Temp: 98.1 F (36.7 C)   SpO2: 100% 100%   Vitals:   04/05/22 0138 04/05/22 0408 04/05/22 0645 04/05/22 0746  BP: 114/76  126/75   Pulse: 93  99   Resp: 18 14 18 16  $ Temp: 98.3 F (36.8 C)  98.1 F (36.7 C)   TempSrc: Oral  Oral   SpO2: 96% 96% 100% 100%  Weight:      Height:        General appearance : Awake, alert, not in any distress. Speech Clear. Not toxic looking HEENT: Atraumatic and Normocephalic, pupils equally reactive to light and accomodation Neck: Supple, no JVD. No cervical lymphadenopathy.  Chest: Good air entry bilaterally, no added sounds  CVS: S1 S2 regular, no murmurs.  Abdomen: Bowel sounds present, Non tender and not distended with no gaurding, rigidity or rebound. Extremities: B/L Lower Ext shows no edema, both legs are warm to touch Neurology: Awake alert, and oriented X 3, CN II-XII intact, Non focal Skin: No Rash  Discharge Instructions  Discharge Instructions     Discharge  patient   Complete by: As directed    Discharge disposition: 01-Home or Self Care   Discharge patient date: 04/05/2022      Allergies as of 04/05/2022       Reactions   Nsaids Other (See Comments)   Kidney disease   Morphine And Related Other (See Comments)   "Real bad headaches"         Medication List     TAKE these medications    amitriptyline 25 MG tablet Commonly known as: ELAVIL Take 50 mg by mouth at bedtime.   calcitRIOL 0.25 MCG capsule Commonly known as: ROCALTROL Take 0.75 mcg by mouth daily with breakfast.   Ferriprox Twice-A-Day 1000 MG Tabs Generic drug: Deferiprone (Twice Daily) Take 3,000 mg by mouth daily.   Ferriprox Twice-A-Day 1000 MG Tabs Generic drug: Deferiprone (Twice Daily) Take 2,000 mg by mouth 2 (two) times daily.   folic acid 1 MG tablet Commonly known as: FOLVITE TAKE 1 TABLET BY MOUTH EVERY  DAY What changed: when to take this   hydroxyurea 500 MG capsule Commonly known as: HYDREA Take 1-2 capsules (500-1,000 mg total) by mouth See admin instructions. Take 500 mg by mouth in the morning on Sun/Tues/Wed/Thurs/Sat and 1,000 mg on Mon/Fri   Oxbryta 500 MG Tabs tablet Generic drug: voxelotor Take 1,500 mg by mouth daily with breakfast.   Oxycodone HCl 10 MG Tabs Take 1 tablet by mouth every 6 hours as needed (pain).   OxyCONTIN 15 mg 12 hr tablet Generic drug: oxyCODONE Take 1 tablet (15 mg total) by mouth every 12 (twelve) hours.   sodium bicarbonate 650 MG tablet Take 1,950 mg by mouth 2 (two) times daily.   Tylenol 8 Hour Arthritis Pain 650 MG CR tablet Generic drug: acetaminophen Take 650 mg by mouth every 8 (eight) hours as needed for pain.   Veltassa 16.8 g Pack Generic drug: Patiromer Sorbitex Calcium Take 16.8 g by mouth daily.        The results of significant diagnostics from this hospitalization (including imaging, microbiology, ancillary and laboratory) are listed below for reference.    Significant  Diagnostic Studies: No results found.  Microbiology: No results found for this or any previous visit (from the past 240 hour(s)).   Labs: Basic Metabolic Panel: Recent Labs  Lab 04/03/22 1004 04/04/22 0709 04/05/22 0804  NA 136 134* 138  K 3.6 4.4 4.5  CL 97* 103 106  CO2 26 22 21*  GLUCOSE 108* 89 91  BUN 43* 32* 29*  CREATININE 4.06* 3.36* 3.16*  CALCIUM 8.5* 8.3* 8.9   Liver Function Tests: Recent Labs  Lab 04/03/22 1004  AST 47*  ALT 37  ALKPHOS 145*  BILITOT 1.5*  PROT 7.4  ALBUMIN 3.3*   No results for input(s): "LIPASE", "AMYLASE" in the last 168 hours. No results for input(s): "AMMONIA" in the last 168 hours. CBC: Recent Labs  Lab 04/03/22 1004 04/04/22 0709 04/05/22 0804  WBC 13.1* 14.4* 12.7*  NEUTROABS 7.3  --   --   HGB 8.0* 6.6* 9.6*  HCT 21.8* 18.6* 26.7*  MCV 121.1* 124.8* 113.6*  PLT 159 118* 162   Cardiac Enzymes: No results for input(s): "CKTOTAL", "CKMB", "CKMBINDEX", "TROPONINI" in the last 168 hours. BNP: Invalid input(s): "POCBNP" CBG: No results for input(s): "GLUCAP" in the last 168 hours.  Time coordinating discharge: 30 minutes  Signed: [name]  APRN, MSN, FNP-C Patient [place] [address] [place], [place] [address] [phone]  [facility] 04/05/2022, 9:12 AM

## 2022-04-06 ENCOUNTER — Telehealth: Payer: Self-pay

## 2022-04-06 NOTE — Transitions of Care (Post Inpatient/ED Visit) (Signed)
   04/06/2022  Name: [name] [name]. MRN: [id number] DOB: 1980-07-04  Today's TOC FU Call Status: Today's TOC FU Call Status:: Successful TOC FU Call Competed TOC FU Call Complete Date: 04/06/22  Transition Care Management Follow-up Telephone Call Date of Discharge: 04/05/22 Discharge Facility: [facility]) Type of Discharge: Inpatient Admission Primary Inpatient Discharge Diagnosis:: Sickle cell pain crisis How have you been since you were released from the hospital?: Better Any questions or concerns?: No  Items Reviewed: Did you receive and understand the discharge instructions provided?: Yes Medications obtained and verified?: Yes (Medications Reviewed) Any new allergies since your discharge?: No Dietary orders reviewed?: NA Do you have support at home?: Yes  Home Care and Equipment/Supplies: [place] Ordered?: No Any new equipment or medical supplies ordered?: No  Functional Questionnaire: Do you need assistance with bathing/showering or dressing?: No Do you need assistance with meal preparation?: No Do you need assistance with eating?: No Do you have difficulty maintaining continence: No Do you need assistance with getting out of bed/getting out of a chair/moving?: No Do you have difficulty managing or taking your medications?: No  Folllow up appointments reviewed: PCP Follow-up appointment confirmed?: No (pt is calling to schedule fu appt with labs and OV on 04-11-22 with PCP) MD Provider Line Number:[phone] Given: Yes Follow-up Provider: Camm[facility] S[facility] Follow-up appointment confirmed?: No Do you need transportation to your follow-up appointment?: No Do you understand care options if your condition(s) worsen?: Yes-patient verbalized understanding    [place] LPN [place] [phone]

## 2022-04-10 ENCOUNTER — Other Ambulatory Visit: Payer: Self-pay | Admitting: Family Medicine

## 2022-04-10 ENCOUNTER — Other Ambulatory Visit (HOSPITAL_COMMUNITY): Payer: Self-pay

## 2022-04-10 DIAGNOSIS — G894 Chronic pain syndrome: Secondary | ICD-10-CM

## 2022-04-10 DIAGNOSIS — D571 Sickle-cell disease without crisis: Secondary | ICD-10-CM

## 2022-04-10 NOTE — Telephone Encounter (Signed)
Please advise K[facility] 

## 2022-04-11 ENCOUNTER — Non-Acute Institutional Stay (HOSPITAL_COMMUNITY)
Admission: RE | Admit: 2022-04-11 | Discharge: 2022-04-11 | Disposition: A | Discharge status: Home / Self Care | Payer: Medicare Other | Source: Ambulatory Visit | Attending: Internal Medicine | Admitting: Internal Medicine

## 2022-04-11 ENCOUNTER — Other Ambulatory Visit (HOSPITAL_COMMUNITY): Payer: Self-pay

## 2022-04-11 ENCOUNTER — Other Ambulatory Visit: Payer: Self-pay | Admitting: Family Medicine

## 2022-04-11 DIAGNOSIS — G894 Chronic pain syndrome: Secondary | ICD-10-CM

## 2022-04-11 DIAGNOSIS — D571 Sickle-cell disease without crisis: Secondary | ICD-10-CM

## 2022-04-11 DIAGNOSIS — D5700 Hb-SS disease with crisis, unspecified: Secondary | ICD-10-CM

## 2022-04-11 LAB — COMPREHENSIVE METABOLIC PANEL
ALT: 28 U/L (ref 0–44)
AST: 40 U/L (ref 15–41)
Albumin: 3.3 g/dL — ABNORMAL LOW (ref 3.5–5.0)
Alkaline Phosphatase: 104 U/L (ref 38–126)
Anion gap: 8 (ref 5–15)
BUN: 42 mg/dL — ABNORMAL HIGH (ref 6–20)
CO2: 23 mmol/L (ref 22–32)
Calcium: 8.6 mg/dL — ABNORMAL LOW (ref 8.9–10.3)
Chloride: 106 mmol/L (ref 98–111)
Creatinine, Ser: 3.24 mg/dL — ABNORMAL HIGH (ref 0.61–1.24)
GFR, Estimated: 24 mL/min — ABNORMAL LOW (ref >60)
Glucose, Bld: 91 mg/dL (ref 70–99)
Potassium: 5.3 mmol/L — ABNORMAL HIGH (ref 3.5–5.1)
Sodium: 137 mmol/L (ref 135–145)
Total Bilirubin: 0.9 mg/dL (ref 0.3–1.2)
Total Protein: 7.1 g/dL (ref 6.5–8.1)

## 2022-04-11 LAB — CBC WITH DIFFERENTIAL/PLATELET
Abs Immature Granulocytes: 0.02 10*3/uL (ref 0.00–0.07)
Basophils Absolute: 0 10*3/uL (ref 0.0–0.1)
Basophils Relative: 0 %
Eosinophils Absolute: 0.1 10*3/uL (ref 0.0–0.5)
Eosinophils Relative: 1 %
HCT: 25.9 % — ABNORMAL LOW (ref 39.0–52.0)
Hemoglobin: 9.1 g/dL — ABNORMAL LOW (ref 13.0–17.0)
Immature Granulocytes: 0 %
Lymphocytes Relative: 58 %
Lymphs Abs: 5.5 10*3/uL — ABNORMAL HIGH (ref 0.7–4.0)
MCH: 41.7 pg — ABNORMAL HIGH (ref 26.0–34.0)
MCHC: 35.1 g/dL (ref 30.0–36.0)
MCV: 118.8 fL — ABNORMAL HIGH (ref 80.0–100.0)
Monocytes Absolute: 0.5 10*3/uL (ref 0.1–1.0)
Monocytes Relative: 5 %
Neutro Abs: 3.4 10*3/uL (ref 1.7–7.7)
Neutrophils Relative %: 36 %
Platelets: 175 10*3/uL (ref 150–400)
RBC: 2.18 MIL/uL — ABNORMAL LOW (ref 4.22–5.81)
RDW: 21.8 % — ABNORMAL HIGH (ref 11.5–15.5)
WBC: 9.5 10*3/uL (ref 4.0–10.5)
nRBC: 0 % (ref 0.0–0.2)

## 2022-04-11 LAB — RETICULOCYTES
Immature Retic Fract: 25.1 % — ABNORMAL HIGH (ref 2.3–15.9)
RBC.: 2.19 MIL/uL — ABNORMAL LOW (ref 4.22–5.81)
Retic Count, Absolute: 54.8 10*3/uL (ref 19.0–186.0)
Retic Ct Pct: 2.5 % (ref 0.4–3.1)

## 2022-04-11 MED ORDER — OXYCODONE HCL 10 MG PO TABS
10.0000 mg | ORAL_TABLET | Freq: Four times a day (QID) | ORAL | 0 refills | Status: DC | PRN
  Filled 2022-04-11 (×2): qty 60, 15d supply, fill #0

## 2022-04-11 NOTE — Progress Notes (Signed)
Reviewed PDMP substance reporting system prior to prescribing opiate medications. No inconsistencies noted.  Meds ordered this encounter  Medications   Oxycodone HCl 10 MG TABS    Sig: Take 1 tablet by mouth every 6 hours as needed (pain).    Dispense:  60 tablet    Refill:  0    Order Specific Question:   Supervising Provider    Answer:   [name] [id number]   [name]  APRN, MSN, FNP-C Patient [facility] [address] [place], [place] [address] [phone]

## 2022-04-11 NOTE — Telephone Encounter (Signed)
Caller & Relationship to patient:  MRN #  [id number]   Call Back Number:   Date of Last Office Visit: 04/06/2022     Date of Next Office Visit: 05/09/2022    Medication(s) to be Refilled: Oxycontin's   Preferred Pharmacy:   ** Please notify patient to allow 48-72 hours to process** **Let patient know to contact pharmacy at the end of the day to make sure medication is ready. ** **If patient has not been seen in a year or longer, book an appointment **Advise to use MyChart for refill requests OR to contact their pharmacy

## 2022-04-11 NOTE — Progress Notes (Signed)
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 Patient tolerated well. Alert, oriented and ambulatory at discharge.

## 2022-04-12 ENCOUNTER — Other Ambulatory Visit (HOSPITAL_COMMUNITY): Payer: Self-pay

## 2022-04-13 ENCOUNTER — Other Ambulatory Visit (HOSPITAL_COMMUNITY): Payer: Self-pay

## 2022-04-18 ENCOUNTER — Other Ambulatory Visit: Payer: Self-pay | Admitting: Family Medicine

## 2022-04-18 ENCOUNTER — Other Ambulatory Visit (HOSPITAL_COMMUNITY): Payer: Self-pay

## 2022-04-18 ENCOUNTER — Telehealth: Payer: Self-pay | Admitting: Family Medicine

## 2022-04-18 DIAGNOSIS — D571 Sickle-cell disease without crisis: Secondary | ICD-10-CM

## 2022-04-18 MED ORDER — OXYCODONE HCL ER 15 MG PO T12A
15.0000 mg | EXTENDED_RELEASE_TABLET | Freq: Two times a day (BID) | ORAL | 0 refills | Status: DC
  Filled 2022-04-20: qty 60, 30d supply, fill #0

## 2022-04-18 NOTE — Telephone Encounter (Signed)
Caller & Relationship to patient:  MRN #  [id number]   Call Back Number:   Date of Last Office Visit: 04/10/2022     Date of Next Office Visit: 05/09/2022    Medication(s) to be Refilled: Oxy contin 15 mg  Preferred Pharmacy:   ** Please notify patient to allow 48-72 hours to process** **Let patient know to contact pharmacy at the end of the day to make sure medication is ready. ** **If patient has not been seen in a year or longer, book an appointment **Advise to use MyChart for refill requests OR to contact their pharmacy

## 2022-04-18 NOTE — Progress Notes (Signed)
Reviewed PDMP substance reporting system prior to prescribing opiate medications. No inconsistencies noted.  Meds ordered this encounter  Medications   oxyCODONE (OXYCONTIN) 15 mg 12 hr tablet    Sig: Take 1 tablet (15 mg total) by mouth every 12 (twelve) hours.    Dispense:  60 tablet    Refill:  0    Order Specific Question:   Supervising Provider    Answer:   [name] [id number]   [name]  APRN, MSN, FNP-C Patient [facility] [address] [place], [place] [address] [phone]

## 2022-04-20 ENCOUNTER — Other Ambulatory Visit (HOSPITAL_COMMUNITY): Payer: Self-pay

## 2022-04-25 ENCOUNTER — Ambulatory Visit: Payer: Self-pay | Admitting: Family Medicine

## 2022-04-25 ENCOUNTER — Other Ambulatory Visit: Payer: Self-pay | Admitting: Nurse Practitioner

## 2022-04-25 ENCOUNTER — Telehealth: Payer: Self-pay | Admitting: Family Medicine

## 2022-04-25 DIAGNOSIS — G894 Chronic pain syndrome: Secondary | ICD-10-CM

## 2022-04-25 DIAGNOSIS — D571 Sickle-cell disease without crisis: Secondary | ICD-10-CM

## 2022-04-25 MED ORDER — OXYCODONE HCL 10 MG PO TABS: 10.0000 mg | ORAL_TABLET | Freq: Four times a day (QID) | ORAL | 0 refills | Status: DC | PRN

## 2022-04-26 ENCOUNTER — Other Ambulatory Visit (HOSPITAL_COMMUNITY): Payer: Self-pay

## 2022-04-26 NOTE — Telephone Encounter (Signed)
Caller & Relationship to patient:  MRN #  [id number]   Call Back Number:   Date of Last Office Visit: 04/18/2022     Date of Next Office Visit: 05/09/2022    Medication(s) to be Refilled: Oxy Contin 10  Preferred Pharmacy:   ** Please notify patient to allow 48-72 hours to process** **Let patient know to contact pharmacy at the end of the day to make sure medication is ready. ** **If patient has not been seen in a year or longer, book an appointment **Advise to use MyChart for refill requests OR to contact their pharmacy

## 2022-04-30 ENCOUNTER — Encounter (HOSPITAL_COMMUNITY): Payer: Self-pay | Admitting: Internal Medicine

## 2022-04-30 ENCOUNTER — Inpatient Hospital Stay (HOSPITAL_COMMUNITY)
Admission: EM | Admit: 2022-04-30 | Discharge: 2022-05-01 | DRG: 812 | Disposition: A | Discharge status: Home / Self Care | Payer: Medicare Other | Attending: Internal Medicine | Admitting: Internal Medicine

## 2022-04-30 ENCOUNTER — Other Ambulatory Visit: Payer: Self-pay

## 2022-04-30 ENCOUNTER — Emergency Department (HOSPITAL_COMMUNITY): Payer: Medicare Other

## 2022-04-30 DIAGNOSIS — Z803 Family history of malignant neoplasm of breast: Secondary | ICD-10-CM

## 2022-04-30 DIAGNOSIS — D631 Anemia in chronic kidney disease: Secondary | ICD-10-CM | POA: Diagnosis present

## 2022-04-30 DIAGNOSIS — Z9081 Acquired absence of spleen: Secondary | ICD-10-CM

## 2022-04-30 DIAGNOSIS — Z79899 Other long term (current) drug therapy: Secondary | ICD-10-CM

## 2022-04-30 DIAGNOSIS — D5700 Hb-SS disease with crisis, unspecified: Secondary | ICD-10-CM | POA: Diagnosis not present

## 2022-04-30 DIAGNOSIS — F1491 Cocaine use, unspecified, in remission: Secondary | ICD-10-CM | POA: Diagnosis present

## 2022-04-30 DIAGNOSIS — F17210 Nicotine dependence, cigarettes, uncomplicated: Secondary | ICD-10-CM | POA: Diagnosis present

## 2022-04-30 DIAGNOSIS — G894 Chronic pain syndrome: Secondary | ICD-10-CM | POA: Diagnosis present

## 2022-04-30 DIAGNOSIS — Z885 Allergy status to narcotic agent status: Secondary | ICD-10-CM | POA: Diagnosis not present

## 2022-04-30 DIAGNOSIS — Z886 Allergy status to analgesic agent status: Secondary | ICD-10-CM | POA: Diagnosis not present

## 2022-04-30 DIAGNOSIS — D57219 Sickle-cell/Hb-C disease with crisis, unspecified: Principal | ICD-10-CM | POA: Diagnosis present

## 2022-04-30 DIAGNOSIS — Z79891 Long term (current) use of opiate analgesic: Secondary | ICD-10-CM | POA: Diagnosis not present

## 2022-04-30 DIAGNOSIS — N184 Chronic kidney disease, stage 4 (severe): Secondary | ICD-10-CM | POA: Diagnosis present

## 2022-04-30 DIAGNOSIS — Z8669 Personal history of other diseases of the nervous system and sense organs: Secondary | ICD-10-CM

## 2022-04-30 LAB — COMPREHENSIVE METABOLIC PANEL
ALT: 28 U/L (ref 0–44)
AST: 32 U/L (ref 15–41)
Albumin: 3.3 g/dL — ABNORMAL LOW (ref 3.5–5.0)
Alkaline Phosphatase: 124 U/L (ref 38–126)
Anion gap: 9 (ref 5–15)
BUN: 45 mg/dL — ABNORMAL HIGH (ref 6–20)
CO2: 27 mmol/L (ref 22–32)
Calcium: 9.2 mg/dL (ref 8.9–10.3)
Chloride: 103 mmol/L (ref 98–111)
Creatinine, Ser: 3.09 mg/dL — ABNORMAL HIGH (ref 0.61–1.24)
GFR, Estimated: 25 mL/min — ABNORMAL LOW (ref >60)
Glucose, Bld: 80 mg/dL (ref 70–99)
Potassium: 4.4 mmol/L (ref 3.5–5.1)
Sodium: 139 mmol/L (ref 135–145)
Total Bilirubin: 1 mg/dL (ref 0.3–1.2)
Total Protein: 6.9 g/dL (ref 6.5–8.1)

## 2022-04-30 LAB — CBC WITH DIFFERENTIAL/PLATELET
Abs Immature Granulocytes: 0.05 10*3/uL (ref 0.00–0.07)
Basophils Absolute: 0 10*3/uL (ref 0.0–0.1)
Basophils Relative: 0 %
Eosinophils Absolute: 0.1 10*3/uL (ref 0.0–0.5)
Eosinophils Relative: 1 %
HCT: 23.9 % — ABNORMAL LOW (ref 39.0–52.0)
Hemoglobin: 8.5 g/dL — ABNORMAL LOW (ref 13.0–17.0)
Immature Granulocytes: 0 %
Lymphocytes Relative: 47 %
Lymphs Abs: 6.1 10*3/uL — ABNORMAL HIGH (ref 0.7–4.0)
MCH: 41.3 pg — ABNORMAL HIGH (ref 26.0–34.0)
MCHC: 35.6 g/dL (ref 30.0–36.0)
MCV: 116 fL — ABNORMAL HIGH (ref 80.0–100.0)
Monocytes Absolute: 0.9 10*3/uL (ref 0.1–1.0)
Monocytes Relative: 7 %
Neutro Abs: 5.8 10*3/uL (ref 1.7–7.7)
Neutrophils Relative %: 45 %
Platelets: 295 10*3/uL (ref 150–400)
RBC: 2.06 MIL/uL — ABNORMAL LOW (ref 4.22–5.81)
RDW: 18 % — ABNORMAL HIGH (ref 11.5–15.5)
WBC: 13 10*3/uL — ABNORMAL HIGH (ref 4.0–10.5)
nRBC: 0.2 % (ref 0.0–0.2)

## 2022-04-30 LAB — RAPID URINE DRUG SCREEN, HOSP PERFORMED
Amphetamines: NOT DETECTED
Barbiturates: NOT DETECTED
Benzodiazepines: NOT DETECTED
Cocaine: NOT DETECTED
Opiates: POSITIVE — AB
Tetrahydrocannabinol: POSITIVE — AB

## 2022-04-30 LAB — RETICULOCYTES
Immature Retic Fract: 27.5 % — ABNORMAL HIGH (ref 2.3–15.9)
RBC.: 2.04 MIL/uL — ABNORMAL LOW (ref 4.22–5.81)
Retic Count, Absolute: 92.4 10*3/uL (ref 19.0–186.0)
Retic Ct Pct: 4.5 % — ABNORMAL HIGH (ref 0.4–3.1)

## 2022-04-30 MED ORDER — SODIUM CHLORIDE 0.45 % IV SOLN
INTRAVENOUS | Status: DC
  Administered 2022-04-30: 1000 mL via INTRAVENOUS

## 2022-04-30 MED ORDER — ONDANSETRON HCL 4 MG/2ML IJ SOLN
4.0000 mg | Freq: Once | INTRAMUSCULAR | Status: AC
  Administered 2022-04-30: 4 mg via INTRAVENOUS
  Filled 2022-04-30: qty 2

## 2022-04-30 MED ORDER — HYDROXYUREA 500 MG PO CAPS: 500.0000 mg | ORAL_CAPSULE | ORAL | Status: DC

## 2022-04-30 MED ORDER — AMITRIPTYLINE HCL 25 MG PO TABS
50.0000 mg | ORAL_TABLET | Freq: Every day | ORAL | Status: DC
  Administered 2022-04-30: 50 mg via ORAL
  Filled 2022-04-30: qty 2

## 2022-04-30 MED ORDER — DIPHENHYDRAMINE HCL 25 MG PO CAPS: 25.0000 mg | ORAL_CAPSULE | ORAL | Status: DC | PRN

## 2022-04-30 MED ORDER — SODIUM CHLORIDE 0.9% FLUSH: 9.0000 mL | INTRAVENOUS | Status: DC | PRN

## 2022-04-30 MED ORDER — HYDROXYUREA 500 MG PO CAPS
1000.0000 mg | ORAL_CAPSULE | ORAL | Status: DC
  Administered 2022-05-01: 1000 mg via ORAL
  Filled 2022-04-30: qty 2

## 2022-04-30 MED ORDER — VOXELOTOR 500 MG PO TABS: 1500.0000 mg | ORAL_TABLET | Freq: Every day | ORAL | Status: DC

## 2022-04-30 MED ORDER — ONDANSETRON HCL 4 MG/2ML IJ SOLN: 4.0000 mg | Freq: Four times a day (QID) | INTRAMUSCULAR | Status: DC | PRN

## 2022-04-30 MED ORDER — SODIUM CHLORIDE 0.9 % IV BOLUS
1000.0000 mL | Freq: Once | INTRAVENOUS | Status: AC
  Administered 2022-04-30: 1000 mL via INTRAVENOUS

## 2022-04-30 MED ORDER — HYDROMORPHONE HCL 2 MG/ML IJ SOLN
2.0000 mg | Freq: Once | INTRAMUSCULAR | Status: AC
  Administered 2022-04-30: 2 mg via INTRAVENOUS
  Filled 2022-04-30: qty 1

## 2022-04-30 MED ORDER — HYDROMORPHONE 1 MG/ML IV SOLN
INTRAVENOUS | Status: DC
  Administered 2022-04-30: 6 mg via INTRAVENOUS
  Administered 2022-04-30: 2.5 mg via INTRAVENOUS
  Administered 2022-04-30: 30 mg via INTRAVENOUS
  Administered 2022-05-01: 3.5 mg via INTRAVENOUS
  Administered 2022-05-01 (×2): 2.5 mg via INTRAVENOUS
  Filled 2022-04-30: qty 30

## 2022-04-30 MED ORDER — POLYETHYLENE GLYCOL 3350 17 G PO PACK: 17.0000 g | PACK | Freq: Every day | ORAL | Status: DC | PRN

## 2022-04-30 MED ORDER — DIPHENHYDRAMINE HCL 25 MG PO CAPS
25.0000 mg | ORAL_CAPSULE | Freq: Once | ORAL | Status: AC
  Administered 2022-04-30: 25 mg via ORAL
  Filled 2022-04-30: qty 1

## 2022-04-30 MED ORDER — HYDROMORPHONE HCL 2 MG/ML IJ SOLN
2.0000 mg | INTRAMUSCULAR | Status: AC
  Administered 2022-04-30: 2 mg via INTRAVENOUS
  Filled 2022-04-30: qty 1

## 2022-04-30 MED ORDER — DEFERIPRONE (TWICE DAILY) 1000 MG PO TABS: 3000.0000 mg | ORAL_TABLET | Freq: Every day | ORAL | Status: DC

## 2022-04-30 MED ORDER — OXYCODONE HCL 5 MG PO TABS: 10.0000 mg | ORAL_TABLET | Freq: Four times a day (QID) | ORAL | Status: DC | PRN

## 2022-04-30 MED ORDER — FOLIC ACID 1 MG PO TABS
1.0000 mg | ORAL_TABLET | Freq: Every day | ORAL | Status: DC
  Administered 2022-05-01: 1 mg via ORAL
  Filled 2022-04-30: qty 1

## 2022-04-30 MED ORDER — ENOXAPARIN SODIUM 30 MG/0.3ML IJ SOSY: 30.0000 mg | PREFILLED_SYRINGE | INTRAMUSCULAR | Status: DC

## 2022-04-30 MED ORDER — OXYCODONE HCL ER 15 MG PO T12A
15.0000 mg | EXTENDED_RELEASE_TABLET | Freq: Two times a day (BID) | ORAL | Status: DC
  Administered 2022-04-30 – 2022-05-01 (×2): 15 mg via ORAL
  Filled 2022-04-30 (×2): qty 1

## 2022-04-30 MED ORDER — NALOXONE HCL 0.4 MG/ML IJ SOLN: 0.4000 mg | INTRAMUSCULAR | Status: DC | PRN

## 2022-04-30 MED ORDER — SENNOSIDES-DOCUSATE SODIUM 8.6-50 MG PO TABS
1.0000 | ORAL_TABLET | Freq: Two times a day (BID) | ORAL | Status: DC
  Filled 2022-04-30: qty 1

## 2022-04-30 NOTE — ED Provider Notes (Signed)
[facility] Provider Note   CSN: [id number] Arrival date & time: 04/30/22  [id number]     History  Chief Complaint  Patient presents with   Sickle Cell Pain Crisis    [name] [name]. is a 42 y.o. male with a past medical history of sickle cell presents today for evaluation sickle cell pain.  Patient reports increased pain in his legs in the last 3 days.  He reports swelling on the right knee so he has to lean on the left side when walking which resulted in pain on the left foot.  He denies any chest pain.  He endorses shortness of breath mainly with walking due to pain on his leg.  Denies fever, nausea, vomiting.   Sickle Cell Pain Crisis     Past Medical History:  Diagnosis Date   Abscess of right iliac fossa 09/24/2018   required Perc Drain 09/24/2018   Arachnoid Cyst of brain bilaterally    "2 really small ones in the back of my head; inside; saw them w/MRI" (09/25/2012)   Bacterial pneumonia ~ 2012   "caught it here in the hospital" (09/25/2012)   Chronic kidney disease    "from my sickle cell" (09/25/2012)   CKD (chronic kidney disease), stage II    Colitis 04/19/2017   CT scan abd/ pelvis   GERD (gastroesophageal reflux disease)    "after I eat alot of spicey foods" (09/25/2012)   Gynecomastia, male 07/10/2012   History of blood transfusion    "always related to sickle cell crisis" (09/25/2012)   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   Noted in noted from Hematology notes at [facility]    "take RX qd to prevent them" (09/25/2012)   Opioid dependence with withdrawal ([place]) 08/30/2012   Renal insufficiency    Sickle cell anemia (HCC)    Sickle cell crisis ([place]) 09/25/2012   Sickle cell nephropathy ([place]) 07/10/2012   Sinus tachycardia    Tachycardia with heart rate 121-140 beats per minute with ambulation 08/04/2016   Past Surgical History:  Procedure Laterality Date   ABCESS DRAINAGE      CHOLECYSTECTOMY  ~ 2012   COLONOSCOPY N/A 04/23/2017   Procedure: COLONOSCOPY;  Surgeon: Irene Shipper, MD;  Location: [facility];  Service: Endoscopy;  Laterality: N/A;   IR FLUORO GUIDE CV LINE RIGHT  12/17/2016   IR FLUORO GUIDE CV LINE RIGHT  02/07/2021   IR RADIOLOGIST EVAL & MGMT  10/02/2018   IR RADIOLOGIST EVAL & MGMT  10/15/2018   IR REMOVAL TUN CV CATH W/O FL  12/21/2016   IR REMOVAL TUN CV CATH W/O FL  02/11/2021   IR US GUIDE VASC ACCESS RIGHT  12/17/2016   IR US GUIDE VASC ACCESS RIGHT  02/07/2021   spleenectomy       Home Medications Prior to Admission medications   Medication Sig Start Date End Date Taking? Authorizing Provider  amitriptyline (ELAVIL) 25 MG tablet Take 50 mg by mouth at bedtime. 03/08/21   [provider]  calcitRIOL (ROCALTROL) 0.25 MCG capsule Take 0.75 mcg by mouth daily with breakfast. 11/16/20   [provider]  Deferiprone, Twice Daily, (FERRIPROX TWICE-A-DAY) 1000 MG TABS Take 3,000 mg by mouth daily. Patient not taking: Reported on 04/03/2022    [provider]  Deferiprone, Twice Daily, (FERRIPROX TWICE-A-DAY) 1000 MG TABS Take 2,000 mg by mouth 2 (two) times daily. 02/24/22   [provider]  folic acid (  FOLVITE) 1 MG tablet TAKE 1 TABLET BY MOUTH EVERY DAY Patient taking differently: Take 1 mg by mouth every morning. 03/22/20   [name], FNP  hydroxyurea (HYDREA) 500 MG capsule Take 1-2 capsules (500-1,000 mg total) by mouth See admin instructions. Take 500 mg by mouth in the morning on Sun/Tues/Wed/Thurs/Sat and 1,000 mg on Mon/Fri 01/27/22   [name], FNP  oxyCODONE (OXYCONTIN) 15 mg 12 hr tablet Take 1 tablet (15 mg total) by mouth every 12 (twelve) hours. 04/20/22   [name], FNP  Oxycodone HCl 10 MG TABS Take 1 tablet (10 mg total) by mouth every 6 (six) hours as needed for up to 15 days (pain). 04/25/22 05/10/22  Renee Rival, FNP  sodium bicarbonate 650 MG tablet Take 1,950 mg by mouth  2 (two) times daily. 11/07/18   [provider]  TYLENOL 8 HOUR ARTHRITIS PAIN 650 MG CR tablet Take 650 mg by mouth every 8 (eight) hours as needed for pain.    [provider]  VELTASSA 16.8 g PACK Take 16.8 g by mouth daily. 10/10/21   [provider]  voxelotor (OXBRYTA) 500 MG TABS tablet Take 1,500 mg by mouth daily with breakfast.    [provider]      Allergies    Nsaids and Morphine and related    Review of Systems   Review of Systems Negative except as per HPI.  Physical Exam Updated Vital Signs BP 106/75 (BP Location: Left Arm)   Pulse 89   Temp 98.6 F (37 C) (Oral)   Resp 15   Ht 5\' 3"  (1.6 m)   Wt 54.7 kg   SpO2 100%   BMI 21.36 kg/m  Physical Exam Vitals and nursing note reviewed.  Constitutional:      Appearance: Normal appearance.  HENT:     Head: Normocephalic and atraumatic.     Mouth/Throat:     Mouth: Mucous membranes are moist.  Eyes:     General: No scleral icterus. Cardiovascular:     Rate and Rhythm: Normal rate and regular rhythm.     Pulses: Normal pulses.     Heart sounds: Normal heart sounds.  Pulmonary:     Effort: Pulmonary effort is normal.     Breath sounds: Normal breath sounds.  Abdominal:     General: Abdomen is flat.     Palpations: Abdomen is soft.     Tenderness: There is no abdominal tenderness.  Musculoskeletal:        General: No deformity.     Comments: Swelling and tenderness to palpation to right knee.  Mild swelling of the left foot.  Skin:    General: Skin is warm.     Findings: No rash.  Neurological:     General: No focal deficit present.     Mental Status: He is alert.     Comments: Cranial nerves II through XII intact. Intact sensation to light touch in all 4 extremities. 5/5 strength in all 4 extremities. Intact finger-to-nose and heel-to-shin of all 4 extremities. No visual field cuts. No neglect noted. No aphasia noted.   Psychiatric:        Mood and Affect: Mood normal.      ED Results / Procedures / Treatments   Labs (all labs ordered are listed, but only abnormal results are displayed) Labs Reviewed  RETICULOCYTES - Abnormal; Notable for the following components:      Result Value   Retic Ct Pct 4.5 (*)  RBC. 2.04 (*)    Immature Retic Fract 27.5 (*)    All other components within normal limits  CBC WITH DIFFERENTIAL/PLATELET - Abnormal; Notable for the following components:   WBC 13.0 (*)    RBC 2.06 (*)    Hemoglobin 8.5 (*)    HCT 23.9 (*)    MCV 116.0 (*)    MCH 41.3 (*)    RDW 18.0 (*)    Lymphs Abs 6.1 (*)    All other components within normal limits  COMPREHENSIVE METABOLIC PANEL - Abnormal; Notable for the following components:   BUN 45 (*)    Creatinine, Ser 3.09 (*)    Albumin 3.3 (*)    GFR, Estimated 25 (*)    All other components within normal limits  RAPID URINE DRUG SCREEN, HOSP PERFORMED  CBC    EKG None  Radiology DG Chest 2 View  Result Date: 04/30/2022 CLINICAL DATA:  Short of breath EXAM: CHEST - 2 VIEW COMPARISON:  Radiograph 12/27/2015 FINDINGS: Normal cardiac silhouette. There is patchy densities within the lung similar comparison exam. No focal consolidation. No pleural fluid. No acute osseous findings IMPRESSION: Patchy densities lung related to chronic sickle cell pattern. No clear evidence of acute infection or consolidation. Electronically Signed   By: Suzy Bouchard M.D.   On: 04/30/2022 11:50    Procedures Procedures    Medications Ordered in ED Medications  HYDROmorphone (DILAUDID) injection 2 mg (2 mg Intravenous Given 04/30/22 1244)  oxyCODONE (OXYCONTIN) 12 hr tablet 15 mg (15 mg Oral Not Given 04/30/22 1814)  Oxycodone HCl TABS 10 mg (has no administration in time range)  hydroxyurea (HYDREA) capsule 500-1,000 mg (has no administration in time range)  amitriptyline (ELAVIL) tablet 50 mg (has no administration in time range)  folic acid (FOLVITE) tablet 1 mg (1 mg Oral Not Given 04/30/22 1813)   voxelotor (OXBRYTA) tablet 1,500 mg (has no administration in time range)  Deferiprone (Twice Daily) TABS 3,000 mg (0 mg Oral Hold 04/30/22 1812)  senna-docusate (Senokot-S) tablet 1 tablet (has no administration in time range)  polyethylene glycol (MIRALAX / GLYCOLAX) packet 17 g (has no administration in time range)  naloxone (NARCAN) injection 0.4 mg (has no administration in time range)    And  sodium chloride flush (NS) 0.9 % injection 9 mL (has no administration in time range)  ondansetron (ZOFRAN) injection 4 mg (has no administration in time range)  diphenhydrAMINE (BENADRYL) capsule 25 mg (has no administration in time range)  enoxaparin (LOVENOX) injection 30 mg (has no administration in time range)  0.45 % sodium chloride infusion (1,000 mLs Intravenous New Bag/Given 04/30/22 1710)  HYDROmorphone (DILAUDID) 1 mg/mL PCA injection (30 mg Intravenous Set-up / Initial Syringe 04/30/22 1659)  HYDROmorphone (DILAUDID) injection 2 mg (2 mg Intravenous Given 04/30/22 1102)  sodium chloride 0.9 % bolus 1,000 mL (0 mLs Intravenous Stopped 04/30/22 1246)  HYDROmorphone (DILAUDID) injection 2 mg (2 mg Intravenous Given 04/30/22 1151)  diphenhydrAMINE (BENADRYL) capsule 25 mg (25 mg Oral Given 04/30/22 1243)  ondansetron (ZOFRAN) injection 4 mg (4 mg Intravenous Given 04/30/22 1243)    ED Course/ Medical Decision Making/ A&P                             Medical Decision Making Amount and/or Complexity of Data Reviewed Labs: ordered. Radiology: ordered.  Risk Prescription drug management. Decision regarding hospitalization.   This patient presents to the ED for sickle cell crisis,  this involves an extensive number of treatment options, and is a complaint that carries with a high risk of complications and morbidity.  The differential diagnosis includes sickle cell pain crisis, acute chest syndrome, splenic sequestration, MI.  This is not an exhaustive list.  Lab tests: I ordered and  personally interpreted labs.  The pertinent results include: WBC 13. Hbg 8.5. Platelets unremarkable. Electrolytes unremarkable. BUN, creatinine baseline.  Reticulocyte count 4.5 at baseline.  RBC 2.04.   Imaging studies: I ordered imaging studies. I personally reviewed, interpreted imaging and agree with the radiologist's interpretations. The results include: Chest x-ray showed no acute infection or abnormalities.  Problem list/ ED course/ Critical interventions/ Medical management: HPI: See above Vital signs within normal range and stable throughout visit. Laboratory/imaging studies significant for: See above. On physical examination, patient is afebrile and appears in no acute distress. This patient with known sickle cell disease presents with their classic pain syndrome for a vaso-occlusive crisis. Considered acute chest, stroke, splenic sequestration, and other emergent complications of sickle cell disease. Considered alternate etiologies of this patient's pain to include fracture, MSK pain, infection/abscess, and other ischemic etiologies (stroke, MI) but doubt these are likely. Despite multiple rounds of opioids patients pain was not controlled, so patient was admitted for pain control. I have reviewed the patient home medicines and have made adjustments as needed.  Cardiac monitoring/EKG: The patient was maintained on a cardiac monitor.  I personally reviewed and interpreted the cardiac monitor which showed an underlying rhythm of: sinus rhythm.  Additional history obtained: External records from outside source obtained and reviewed including: Chart review including previous notes, labs, imaging.  Consultations obtained: I requested consultation with Dr. Doreene Burke from sickle cell team, and discussed lab and imaging findings as well as pertinent plan.  Recommended admission.  Disposition Patient is admitted for pain control. This chart was dictated using voice recognition software.   Despite best efforts to proofread,  errors can occur which can change the documentation meaning.          Final Clinical Impression(s) / ED Diagnoses Final diagnoses:  Sickle cell pain crisis [facility])    Rx / DC Orders ED Discharge Orders     None         Rex Kras, [place] 04/30/22 [id number]    Dorie Rank, MD 05/01/22 [phone]

## 2022-04-30 NOTE — Plan of Care (Signed)
°  Problem: Clinical Measurements: °Goal: Ability to maintain clinical measurements within normal limits will improve °Outcome: Progressing °  °Problem: Activity: °Goal: Risk for activity intolerance will decrease °Outcome: Progressing °  °Problem: Coping: °Goal: Level of anxiety will decrease °Outcome: Progressing °  °Problem: Pain Managment: °Goal: General experience of comfort will improve °Outcome: Progressing °  °Problem: Safety: °Goal: Ability to remain free from injury will improve °Outcome: Progressing °  °Problem: Skin Integrity: °Goal: Risk for impaired skin integrity will decrease °Outcome: Progressing °  °

## 2022-04-30 NOTE — ED Triage Notes (Signed)
Pt arrived via POV. C/o SCC beginning yesterday. Pain in bilat LE. Swelling in R knee.  AOx4

## 2022-04-30 NOTE — ED Notes (Signed)
ED TO INPATIENT HANDOFF REPORT  ED Nurse Name and Phone #: Alroy Bailiff Name/Age/Gender [name] [name]. 42 y.o. male Room/Bed: WA02/WA02  Code Status   Code Status: Full Code  Home/SNF/Other Home Patient oriented to: self, place, time, and situation Is this baseline? Yes   Triage Complete: Triage complete  Chief Complaint Sickle cell anemia with pain ([place]) [D57.00]  Triage Note Pt arrived via POV. C/o SCC beginning yesterday. Pain in bilat LE. Swelling in R knee.  AOx4   Allergies Allergies  Allergen Reactions   Nsaids Other (See Comments)    Kidney disease   Morphine And Related Other (See Comments)    "Real bad headaches"     Level of Care/Admitting Diagnosis ED Disposition     ED Disposition  Admit   Condition  --   [place]: [place] [[id number]]  Level of Care: Med-Surg [16]  May admit patient to [facility] or [facility] if equivalent level of care is available:: No  Covid Evaluation: Asymptomatic - no recent exposure (last 10 days) testing not required  Diagnosis: Sickle cell anemia with pain [facility]) [id number]  Admitting Physician: [name] [id number]  Attending Physician: [name] [id number]  Bed request comments: 6E  Certification:: I certify this patient will need inpatient services for at least 2 midnights  Estimated Length of Stay: 3          B Medical/Surgery History Past Medical History:  Diagnosis Date   Abscess of right iliac fossa 09/24/2018   required Perc Drain 09/24/2018   Arachnoid Cyst of brain bilaterally    "2 really small ones in the back of my head; inside; saw them w/MRI" (09/25/2012)   Bacterial pneumonia ~ 2012   "caught it here in the hospital" (09/25/2012)   Chronic kidney disease    "from my sickle cell" (09/25/2012)   CKD (chronic kidney disease), stage II    Colitis 04/19/2017   CT scan abd/ pelvis   GERD (gastroesophageal reflux disease)    "after I eat  alot of spicey foods" (09/25/2012)   Gynecomastia, male 07/10/2012   History of blood transfusion    "always related to sickle cell crisis" (09/25/2012)   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   Noted in noted from Hematology notes at [place]    "take RX qd to prevent them" (09/25/2012)   Opioid dependence with withdrawal ([place]) 08/30/2012   Renal insufficiency    Sickle cell anemia ([place])    Sickle cell crisis ([place]) 09/25/2012   Sickle cell nephropathy ([place]) 07/10/2012   Sinus tachycardia    Tachycardia with heart rate 121-140 beats per minute with ambulation 08/04/2016   Past Surgical History:  Procedure Laterality Date   ABCESS DRAINAGE     CHOLECYSTECTOMY  ~ 2012   COLONOSCOPY N/A 04/23/2017   Procedure: COLONOSCOPY;  Surgeon: Irene Shipper, MD;  Location: [facility];  Service: Endoscopy;  Laterality: N/A;   IR FLUORO GUIDE CV LINE RIGHT  12/17/2016   IR FLUORO GUIDE CV LINE RIGHT  02/07/2021   IR RADIOLOGIST EVAL & MGMT  10/02/2018   IR RADIOLOGIST EVAL & MGMT  10/15/2018   IR REMOVAL TUN CV CATH W/O FL  12/21/2016   IR REMOVAL TUN CV CATH W/O FL  02/11/2021   IR US GUIDE VASC ACCESS RIGHT  12/17/2016   IR US GUIDE VASC ACCESS RIGHT  02/07/2021   spleenectomy  A IV Location/Drains/Wounds Patient Lines/Drains/Airways Status     Active Line/Drains/Airways     Name Placement date Placement time Site Days   Peripheral IV 04/30/22 20 G 1.88" Anterior;Right;Upper Arm 04/30/22  1058  Arm  less than 1            Intake/Output Last 24 hours  Intake/Output Summary (Last 24 hours) at 04/30/2022 1511 Last data filed at 04/30/2022 1246 Gross per 24 hour  Intake 1000 ml  Output --  Net 1000 ml    Labs/Imaging Results for orders placed or performed during the hospital encounter of 04/30/22 (from the past 48 hour(s))  Reticulocytes     Status: Abnormal   Collection Time: 04/30/22 11:03 AM  Result Value Ref Range   Retic Ct Pct 4.5 (H) 0.4 - 3.1 %    RBC. 2.04 (L) 4.22 - 5.81 MIL/uL   Retic Count, Absolute 92.4 19.0 - 186.0 K/uL   Immature Retic Fract 27.5 (H) 2.3 - 15.9 %    Comment: Performed at [facility], [place] [address], [place], [place] [address]  CBC WITH DIFFERENTIAL     Status: Abnormal   Collection Time: 04/30/22 11:03 AM  Result Value Ref Range   WBC 13.0 (H) 4.0 - 10.5 K/uL   RBC 2.06 (L) 4.22 - 5.81 MIL/uL   Hemoglobin 8.5 (L) 13.0 - 17.0 g/dL   HCT 23.9 (L) 39.0 - 52.0 %   MCV 116.0 (H) 80.0 - 100.0 fL   MCH 41.3 (H) 26.0 - 34.0 pg   MCHC 35.6 30.0 - 36.0 g/dL   RDW 18.0 (H) 11.5 - 15.5 %   Platelets 295 150 - 400 K/uL   nRBC 0.2 0.0 - 0.2 %   Neutrophils Relative % 45 %   Neutro Abs 5.8 1.7 - 7.7 K/uL   Lymphocytes Relative 47 %   Lymphs Abs 6.1 (H) 0.7 - 4.0 K/uL   Monocytes Relative 7 %   Monocytes Absolute 0.9 0.1 - 1.0 K/uL   Eosinophils Relative 1 %   Eosinophils Absolute 0.1 0.0 - 0.5 K/uL   Basophils Relative 0 %   Basophils Absolute 0.0 0.0 - 0.1 K/uL   Immature Granulocytes 0 %   Abs Immature Granulocytes 0.05 0.00 - 0.07 K/uL   Pappenheimer Bodies PRESENT    Sickle Cells PRESENT    Target Cells PRESENT    Stomatocytes PRESENT     Comment: Performed at [facility], [place] [address], [place],  [address]  Comprehensive metabolic panel     Status: Abnormal   Collection Time: 04/30/22 11:03 AM  Result Value Ref Range   Sodium 139 135 - 145 mmol/L   Potassium 4.4 3.5 - 5.1 mmol/L   Chloride 103 98 - 111 mmol/L   CO2 27 22 - 32 mmol/L   Glucose, Bld 80 70 - 99 mg/dL    Comment: Glucose reference range applies only to samples taken after fasting for at least 8 hours.   BUN 45 (H) 6 - 20 mg/dL   Creatinine, Ser 3.09 (H) 0.61 - 1.24 mg/dL   Calcium 9.2 8.9 - 10.3 mg/dL   Total Protein 6.9 6.5 - 8.1 g/dL   Albumin 3.3 (L) 3.5 - 5.0 g/dL   AST 32 15 - 41 U/L   ALT 28 0 - 44 U/L   Alkaline Phosphatase 124 38 - 126 U/L   Total Bilirubin 1.0 0.3 - 1.2 mg/dL    GFR, Estimated 25 (L) >60 mL/min  Comment: (NOTE) Calculated using the CKD-EPI Creatinine Equation (2021)    Anion gap 9 5 - 15    Comment: Performed at [facility], [place] [address] [place], [place] [address]   *Note: Due to a large number of results and/or encounters for the requested time period, some results have not been displayed. A complete set of results can be found in Results Review.   DG Chest 2 View  Result Date: 04/30/2022 CLINICAL DATA:  Short of breath EXAM: CHEST - 2 VIEW COMPARISON:  Radiograph 12/27/2015 FINDINGS: Normal cardiac silhouette. There is patchy densities within the lung similar comparison exam. No focal consolidation. No pleural fluid. No acute osseous findings IMPRESSION: Patchy densities lung related to chronic sickle cell pattern. No clear evidence of acute infection or consolidation. Electronically Signed   By: Suzy Bouchard M.D.   On: 04/30/2022 11:50    Pending Labs Unresulted Labs (From admission, onward)     Start     Ordered   04/30/22 1431  Rapid urine drug screen (hospital performed)  ONCE - STAT,   STAT        04/30/22 1430   Signed and Held  CBC  Daily,   R      Signed and Held            Vitals/Pain Today's Vitals   04/30/22 1300 04/30/22 1414 04/30/22 1414 04/30/22 1500  BP: 97/66     Pulse: 87     Resp: 16     Temp:  98.1 F (36.7 C)    TempSrc:  Oral    SpO2: 95%     Weight:    50.8 kg  Height:    5\' 3"  (1.6 m)  PainSc:   6      Isolation Precautions No active isolations  Medications Medications  HYDROmorphone (DILAUDID) injection 2 mg (2 mg Intravenous Given 04/30/22 1244)  HYDROmorphone (DILAUDID) injection 2 mg (2 mg Intravenous Given 04/30/22 1102)  sodium chloride 0.9 % bolus 1,000 mL (0 mLs Intravenous Stopped 04/30/22 1246)  HYDROmorphone (DILAUDID) injection 2 mg (2 mg Intravenous Given 04/30/22 1151)  diphenhydrAMINE (BENADRYL) capsule 25 mg (25 mg Oral Given 04/30/22 1243)  ondansetron  (ZOFRAN) injection 4 mg (4 mg Intravenous Given 04/30/22 1243)    Mobility walks     Focused Assessments    R Recommendations: See Admitting Provider Note  Report given to:   Additional Notes:

## 2022-04-30 NOTE — Progress Notes (Addendum)
Patient refuses the SCD's and Kpad.He is very irritable, refuses to let me check his skin below the waist. He has multiply tattoos on his upper body. Urine sent for rapid drug screen

## 2022-04-30 NOTE — H&P (Signed)
H&P  Patient Demographics:  [name] [name], is a 42 y.o. male  MRN: [id number]   DOB - 07-12-1980  Admit Date - 04/30/2022  Outpatient Primary MD for the patient is [name], FNP  Chief Complaint  Patient presents with   Sickle Cell Pain Crisis     HPI:   [name] [name]  is a 42 y.o. male with a medical history that is significant for sickle cell disease type SS, chronic pain syndrome, opiate dependence and tolerance, chronic marijuana use and sometimes polysubstance use disorder, chronic kidney disease stage IV, and anemia of chronic disease who presented to the emergency room today with major complaints of pain in his lower extremities bilaterally and lower back pain.  He also said his pain is worse on the right knee which he thinks is also slightly swollen but no redness.  He rated his pain at 10/10 in the emergency room and now about 8/10, characterized as throbbing and achy.  He does not remember any inciting factor, he took his home pain medications with no sustained relief.  This pain is consistent with his typical sickle cell pain crisis.  He denies any fever, headache, cough, chest pain, shortness of breath, nausea, vomiting or diarrhea.  No urinary symptoms or constipation.  No recent falls or injury, no sick contacts, recent travels or known exposure to COVID-19.  ER course: His vital signs in the emergency room revealed: BP 109/78 (BP Location: Right Arm)  Pulse (!) 113  Temp 98.4 F (36.9 C) (Oral)  Resp 16  SpO2 99%.  He was found to be in some pain, swelling and tenderness to the right knee on palpation.  Slight leukocytosis of 13.0, hemoglobin at baseline of 8.5, robust reticulocytosis with absolute reticulocyte count of 92.4.  Comprehensive metabolic panel was unremarkable with his serum creatinine level at baseline of about 3.  Chest x-ray shows no clear evidence of acute infection or consolidation.  Patient received multiple doses of IV Dilaudid in the emergency  room with no significant relief of his pain.  Patient is being admitted for further evaluation and management of sickle cell pain crisis.   Review of systems:  In addition to the HPI above, patient reports No fever or chills No Headache, No changes with vision or hearing No problems swallowing food or liquids No chest pain, cough or shortness of breath No abdominal pain, No nausea or vomiting, Bowel movements are regular No blood in stool or urine No dysuria No new skin rashes or bruises No new weakness, tingling, numbness in any extremity No recent weight gain or loss No polyuria, polydypsia or polyphagia No significant Mental Stressors  A full 10 point Review of Systems was done, except as stated above, all other Review of Systems were negative.  With Past History of the following :   Past Medical History:  Diagnosis Date   Abscess of right iliac fossa 09/24/2018   required Perc Drain 09/24/2018   Arachnoid Cyst of brain bilaterally    "2 really small ones in the back of my head; inside; saw them w/MRI" (09/25/2012)   Bacterial pneumonia ~ 2012   "caught it here in the hospital" (09/25/2012)   Chronic kidney disease    "from my sickle cell" (09/25/2012)   CKD (chronic kidney disease), stage II    Colitis 04/19/2017   CT scan abd/ pelvis   GERD (gastroesophageal reflux disease)    "after I eat alot of spicey foods" (09/25/2012)   Gynecomastia, male 07/10/2012  History of blood transfusion    "always related to sickle cell crisis" (09/25/2012)   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   Noted in noted from Hematology notes at [place]    "take RX qd to prevent them" (09/25/2012)   Opioid dependence with withdrawal ([place]) 08/30/2012   Renal insufficiency    Sickle cell anemia (HCC)    Sickle cell crisis ([place]) 09/25/2012   Sickle cell nephropathy ([place]) 07/10/2012   Sinus tachycardia    Tachycardia with heart rate 121-140 beats per minute with ambulation  08/04/2016      Past Surgical History:  Procedure Laterality Date   ABCESS DRAINAGE     CHOLECYSTECTOMY  ~ 2012   COLONOSCOPY N/A 04/23/2017   Procedure: COLONOSCOPY;  Surgeon: Irene Shipper, MD;  [facility];  Service: Endoscopy;  Laterality: N/A;   IR FLUORO GUIDE CV LINE RIGHT  12/17/2016   IR FLUORO GUIDE CV LINE RIGHT  02/07/2021   IR RADIOLOGIST EVAL & MGMT  10/02/2018   IR RADIOLOGIST EVAL & MGMT  10/15/2018   IR REMOVAL TUN CV CATH W/O FL  12/21/2016   IR REMOVAL TUN CV CATH W/O FL  02/11/2021   IR US GUIDE VASC ACCESS RIGHT  12/17/2016   IR US GUIDE VASC ACCESS RIGHT  02/07/2021   spleenectomy       Social History:   Social History   Tobacco Use   Smoking status: Some Days    Packs/day: .1    Types: Cigarettes   Smokeless tobacco: Never  Substance Use Topics   Alcohol use: Yes    Comment: Once a month     Lives - At home   Family History :   Family History  Problem Relation Age of Onset   Breast cancer Mother      Home Medications:   Prior to Admission medications   Medication Sig Start Date End Date Taking? Authorizing Provider  amitriptyline (ELAVIL) 25 MG tablet Take 50 mg by mouth at bedtime. 03/08/21   [provider]  calcitRIOL (ROCALTROL) 0.25 MCG capsule Take 0.75 mcg by mouth daily with breakfast. 11/16/20   [provider]  Deferiprone, Twice Daily, (FERRIPROX TWICE-A-DAY) 1000 MG TABS Take 3,000 mg by mouth daily. Patient not taking: Reported on 04/03/2022    [provider]  Deferiprone, Twice Daily, (FERRIPROX TWICE-A-DAY) 1000 MG TABS Take 2,000 mg by mouth 2 (two) times daily. 02/24/22   [provider]  folic acid (FOLVITE) 1 MG tablet TAKE 1 TABLET BY MOUTH EVERY DAY Patient taking differently: Take 1 mg by mouth every morning. 03/22/20   [name], FNP  hydroxyurea (HYDREA) 500 MG capsule Take 1-2 capsules (500-1,000 mg total) by mouth See admin instructions. Take 500 mg by mouth in the morning  on Sun/Tues/Wed/Thurs/Sat and 1,000 mg on Mon/Fri 01/27/22   [name], FNP  oxyCODONE (OXYCONTIN) 15 mg 12 hr tablet Take 1 tablet (15 mg total) by mouth every 12 (twelve) hours. 04/20/22   [name], FNP  Oxycodone HCl 10 MG TABS Take 1 tablet (10 mg total) by mouth every 6 (six) hours as needed for up to 15 days (pain). 04/25/22 05/10/22  Renee Rival, FNP  sodium bicarbonate 650 MG tablet Take 1,950 mg by mouth 2 (two) times daily. 11/07/18   [provider]  TYLENOL 8 HOUR ARTHRITIS PAIN 650 MG CR tablet Take 650 mg by mouth every 8 (eight) hours as needed for pain.  [provider]  VELTASSA 16.8 g PACK Take 16.8 g by mouth daily. 10/10/21   [provider]  voxelotor (OXBRYTA) 500 MG TABS tablet Take 1,500 mg by mouth daily with breakfast.    [provider]     Allergies:   Allergies  Allergen Reactions   Nsaids Other (See Comments)    Kidney disease   Morphine And Related Other (See Comments)    "Real bad headaches"      Physical Exam:   Vitals:   Vitals:   04/30/22 1300 04/30/22 1414  BP: 97/66   Pulse: 87   Resp: 16   Temp:  98.1 F (36.7 C)  SpO2: 95%    Physical Exam: Constitutional: Patient appears well-developed and well-nourished. Not in obvious distress. HENT: Normocephalic, atraumatic, External right and left ear normal. Oropharynx is clear and moist.  Eyes: Conjunctivae and EOM are normal. PERRLA, no scleral icterus. Neck: Normal ROM. Neck supple. No JVD. No tracheal deviation. No thyromegaly. CVS: RRR, S1/S2 +, no murmurs, no gallops, no carotid bruit.  Pulmonary: Effort and breath sounds normal, no stridor, rhonchi, wheezes, rales.  Abdominal: Soft. BS +, no distension, tenderness, rebound or guarding.  Musculoskeletal: Normal range of motion. No edema and no tenderness.  Lymphadenopathy: No lymphadenopathy noted, cervical, inguinal or axillary Neuro: Alert. Normal reflexes, muscle tone coordination.  No cranial nerve deficit. Skin: Skin is warm and dry. No rash noted. Not diaphoretic. No erythema. No pallor. Psychiatric: Normal mood and affect. Behavior, judgment, thought content normal.   Data Review:   CBC Recent Labs  Lab 04/30/22 1103  WBC 13.0*  HGB 8.5*  HCT 23.9*  PLT 295  MCV 116.0*  MCH 41.3*  MCHC 35.6  RDW 18.0*  LYMPHSABS 6.1*  MONOABS 0.9  EOSABS 0.1  BASOSABS 0.0   ------------------------------------------------------------------------------------------------------------------  Chemistries  Recent Labs  Lab 04/30/22 1103  NA 139  K 4.4  CL 103  CO2 27  GLUCOSE 80  BUN 45*  CREATININE 3.09*  CALCIUM 9.2  AST 32  ALT 28  ALKPHOS 124  BILITOT 1.0   ------------------------------------------------------------------------------------------------------------------ CrCl cannot be calculated (Unknown ideal weight.). ------------------------------------------------------------------------------------------------------------------ No results for input(s): "TSH", "T4TOTAL", "T3FREE", "THYROIDAB" in the last 72 hours.  Invalid input(s): "FREET3"  Coagulation profile No results for input(s): "INR", "PROTIME" in the last 168 hours. ------------------------------------------------------------------------------------------------------------------- No results for input(s): "DDIMER" in the last 72 hours. -------------------------------------------------------------------------------------------------------------------  Cardiac Enzymes No results for input(s): "CKMB", "TROPONINI", "MYOGLOBIN" in the last 168 hours.  Invalid input(s): "CK" ------------------------------------------------------------------------------------------------------------------ No results found for: "BNP"  ---------------------------------------------------------------------------------------------------------------  Urinalysis    Component Value Date/Time   COLORURINE YELLOW  04/03/2022 [place] 04/03/2022 1011   LABSPEC 1.008 04/03/2022 1011   PHURINE 8.0 04/03/2022 1011   GLUCOSEU 50 (A) 04/03/2022 1011   HGBUR NEGATIVE 04/03/2022 1011   HGBUR small 03/10/2010 [place] 04/03/2022 1011   BILIRUBINUR negative 04/19/2021 1217   BILIRUBINUR neg 09/30/2020 1117   KETONESUR NEGATIVE 04/03/2022 1011   PROTEINUR NEGATIVE 04/03/2022 1011   UROBILINOGEN 0.2 04/19/2021 1217   UROBILINOGEN 0.2 05/11/2017 1335   NITRITE NEGATIVE 04/03/2022 1011   LEUKOCYTESUR NEGATIVE 04/03/2022 1011    ----------------------------------------------------------------------------------------------------------------   Imaging Results:    DG Chest 2 View  Result Date: 04/30/2022 CLINICAL DATA:  Short of breath EXAM: CHEST - 2 VIEW COMPARISON:  Radiograph 12/27/2015 FINDINGS: Normal cardiac silhouette. There is patchy densities within the lung similar comparison exam. No focal consolidation. No pleural fluid. No  acute osseous findings IMPRESSION: Patchy densities lung related to chronic sickle cell pattern. No clear evidence of acute infection or consolidation. Electronically Signed   By: Suzy Bouchard M.D.   On: 04/30/2022 11:50     Assessment & Plan:  Principal Problem:   Sickle cell anemia with pain (HCC) Active Problems:   CKD (chronic kidney disease), stage IV (HCC)   Chronic pain syndrome   History of cocaine use   History of migraine headaches  Hb Sickle Cell Disease with crisis: Admit patient, start IVF 0.45% Saline @ 75 mls/hour for gentle hydration, start weight based Dilaudid PCA, IV Toradol is contraindicated due to CKD, Restart oral home pain medications, Monitor vitals very closely, Re-evaluate pain scale regularly, 2 L of Oxygen by [place], Patient will be re-evaluated for pain in the context of function and relationship to baseline as care progresses. Leukocytosis: Mild, most likely reactive.  There is no evidence of infection or  inflammation at this time.  Will continue to monitor without antibiotics. Anemia of Chronic Disease: Hemoglobin is 8.5 today which is consistent with his baseline of 8 to 9 g/dL.  We typically do not transfuse patient unless hemoglobin drops below 7.  Will continue to monitor closely and transfuse as appropriate.  Repeat labs in AM. Chronic kidney disease stage IV: Stable.  There is no acute component at this time.  We will rehydrate gently and monitor very closely.  Avoid all nephrotoxins at this time. Chronic pain Syndrome: Restart and continue home pain medications.  DVT Prophylaxis: Subcut Lovenox   AM Labs Ordered, also please review Full Orders  Family Communication: Admission, patient's condition and plan of care including tests being ordered have been discussed with the patient who indicate understanding and agree with the plan and Code Status.  Code Status: Full Code  Consults called: None    Admission status: Inpatient    Time spent in minutes : 50 minutes  Angelica Chessman MD, MHA, CPE, FACP 04/30/2022 at 2:30 PM

## 2022-05-01 DIAGNOSIS — D5700 Hb-SS disease with crisis, unspecified: Secondary | ICD-10-CM | POA: Diagnosis not present

## 2022-05-01 DIAGNOSIS — D57219 Sickle-cell/Hb-C disease with crisis, unspecified: Secondary | ICD-10-CM | POA: Diagnosis not present

## 2022-05-01 LAB — CBC
HCT: 22.9 % — ABNORMAL LOW (ref 39.0–52.0)
Hemoglobin: 8.2 g/dL — ABNORMAL LOW (ref 13.0–17.0)
MCH: 41.6 pg — ABNORMAL HIGH (ref 26.0–34.0)
MCHC: 35.8 g/dL (ref 30.0–36.0)
MCV: 116.2 fL — ABNORMAL HIGH (ref 80.0–100.0)
Platelets: 275 10*3/uL (ref 150–400)
RBC: 1.97 MIL/uL — ABNORMAL LOW (ref 4.22–5.81)
RDW: 17.8 % — ABNORMAL HIGH (ref 11.5–15.5)
WBC: 13.6 10*3/uL — ABNORMAL HIGH (ref 4.0–10.5)
nRBC: 0 % (ref 0.0–0.2)

## 2022-05-01 MED ORDER — DEFERIPRONE (TWICE DAILY) 1000 MG PO TABS: 3000.0000 mg | ORAL_TABLET | Freq: Two times a day (BID) | ORAL | Status: DC

## 2022-05-01 NOTE — Discharge Summary (Signed)
Physician Discharge Summary  [name] [name]. ID:[id number] DOB: 08/08/1980 DOA: 04/30/2022  PCP: [name], FNP  Admit date: 04/30/2022  Discharge date: 05/01/2022  Discharge Diagnoses:  Principal Problem:   Sickle cell anemia with pain ([place]) Active Problems:   CKD (chronic kidney disease), stage IV (HCC)   Chronic pain syndrome   History of cocaine use   History of migraine headaches   Sickle cell pain crisis [facility])   Discharge Condition: Stable  Disposition:  Pt is discharged home in good condition and is to follow up with [name], FNP this week to have labs evaluated. [name] A Stiger Jr. is instructed to increase activity slowly and balance with rest for the next few days, and use prescribed medication to complete treatment of pain  Diet: Regular Wt Readings from Last 3 Encounters:  04/30/22 54.7 kg  04/03/22 50.8 kg  02/09/22 54.9 kg    History of present illness:  [name] [name] is a 42 year old male with a medical history significant for sickle cell disease type SS, chronic pain syndrome, opiate dependence and tolerance, anemia of chronic disease, and chronic kidney disease stage IV presented to the emergency room today with major complaints of pain in his lower extremities bilaterally and lower back.  He also said that his pain is worse in the right knee, which he states is slightly swollen, but no redness.  He rated his pain as 10/10 in the ER and now about 8/10, characterized as throbbing and achy.  He does not remember any inciting factor, he took his home pain medications with no sustained relief.  This pain is consistent with his typical sickle cell pain crisis.  He denies any fever, headache, cough, chest pain, shortness of breath, nausea, vomiting, or diarrhea.  No urinary symptoms or constipation.  No recent falls or injury, no sick contacts, recent travel, or known exposure to COVID-19.  ER course: Vital signs in the emergency room  showed BP 109/78, pulse 113, temperature 98.4 F, respirations 16, and oxygen saturation 99% on room air.  Comprehensive metabolic panel was unremarkable with a serum creatinine level to baseline of about 3.  Chest x-ray shows no clear evidence of acute infection or consolidation.  Patient received multiple doses of IV Dilaudid in the ER with no significant relief of his pain.  Patient is being admitted for further evaluation and management of sickle cell pain crisis. Hospital Course:  Sickle cell disease with pain crisis: Patient was admitted for sickle cell pain crisis and managed appropriately with IVF, IV Dilaudid via PCA  as well as other adjunct therapies per sickle cell pain management protocols. Patient states that pain is well-controlled today and he is requesting discharge home.  His pain intensity is 3/10.  Anemia of chronic disease: Prior to discharge, patient's hemoglobin was 8.2 g/dL, which is consistent with his baseline.  There was no clinical indication for blood transfusion during this admission.  Patient will follow-up with his hematology team at [facility] on next week. Patient will also follow-up with PCP on 05/09/2022 and will repeat CBC with differential and CMP at that time.  Chronic kidney disease stage IV: Creatinine consistent with patient's baseline at discharge.  He is followed by nephrology at [facility] and will follow-up as scheduled.  Patient was therefore discharged home today in a hemodynamically stable condition.   [name] [name] will follow-up with PCP within 1 week of this discharge. [name] [name] was counseled extensively about nonpharmacologic means of pain management, patient verbalized  understanding and was appreciative of  the care received during this admission.   We discussed the need for good hydration, monitoring of hydration status, avoidance of heat, cold, stress, and infection triggers. We discussed the need to be adherent with taking Hydrea and other home medications.  Patient was reminded of the need to seek medical attention immediately if any symptom of bleeding, anemia, or infection occurs.  Discharge Exam: Vitals:   05/01/22 1201 05/01/22 1259  BP:  (!) 94/57  Pulse:  91  Resp: 16 16  Temp:  97.8 F (36.6 C)  SpO2: 99% 100%   Vitals:   05/01/22 0740 05/01/22 1025 05/01/22 1201 05/01/22 1259  BP:  (!) 86/61  (!) 94/57  Pulse:  96  91  Resp: 16 16 16 16   Temp:  98.3 F (36.8 C)  97.8 F (36.6 C)  TempSrc:  Oral    SpO2: 93% 99% 99% 100%  Weight:      Height:        General appearance : Awake, alert, not in any distress. Speech Clear. Not toxic looking HEENT: Atraumatic and Normocephalic, pupils equally reactive to light and accomodation Neck: Supple, no JVD. No cervical lymphadenopathy.  Chest: Good air entry bilaterally, no added sounds  CVS: S1 S2 regular, no murmurs.  Abdomen: Bowel sounds present, Non tender and not distended with no gaurding, rigidity or rebound. Extremities: B/L Lower Ext shows no edema, both legs are warm to touch Neurology: Awake alert, and oriented X 3, CN II-XII intact, Non focal Skin: No Rash  Discharge Instructions  Discharge Instructions     Discharge patient   Complete by: As directed    Discharge disposition: 01-Home or Self Care   Discharge patient date: 05/01/2022      Allergies as of 05/01/2022       Reactions   Nsaids Other (See Comments)   Kidney disease   Morphine And Related Other (See Comments)   "Real bad headaches"         Medication List     TAKE these medications    amitriptyline 25 MG tablet Commonly known as: ELAVIL Take 50 mg by mouth at bedtime.   calcitRIOL 0.25 MCG capsule Commonly known as: ROCALTROL Take 0.5 mcg by mouth daily with breakfast.   Ferriprox Twice-A-Day 1000 MG Tabs Generic drug: Deferiprone (Twice Daily) Take 3,000 mg by mouth 2 (two) times daily.   Ferriprox Twice-A-Day 1000 MG Tabs Generic drug: Deferiprone (Twice Daily) Take 3,000 mg by  mouth daily.   folic acid 1 MG tablet Commonly known as: FOLVITE TAKE 1 TABLET BY MOUTH EVERY DAY What changed: when to take this   hydroxyurea 500 MG capsule Commonly known as: HYDREA Take 1-2 capsules (500-1,000 mg total) by mouth See admin instructions. Take 500 mg by mouth in the morning on Sun/Tues/Wed/Thurs/Sat and 1,000 mg on Mon/Fri What changed: additional instructions   Oxbryta 500 MG Tabs tablet Generic drug: voxelotor Take 1,500 mg by mouth daily with breakfast.   OxyCONTIN 15 mg 12 hr tablet Generic drug: oxyCODONE Take 1 tablet (15 mg total) by mouth every 12 (twelve) hours.   Oxycodone HCl 10 MG Tabs Take 1 tablet (10 mg total) by mouth every 6 (six) hours as needed for up to 15 days (pain).   sodium bicarbonate 650 MG tablet Take 1,950 mg by mouth 2 (two) times daily.   Tylenol 8 Hour Arthritis Pain 650 MG CR tablet Generic drug: acetaminophen Take 650 mg by mouth every 8 (  eight) hours as needed for pain.   Veltassa 16.8 g Pack Generic drug: Patiromer Sorbitex Calcium Take 16.8 g by mouth daily.        The results of significant diagnostics from this hospitalization (including imaging, microbiology, ancillary and laboratory) are listed below for reference.    Significant Diagnostic Studies: DG Chest 2 View  Result Date: 04/30/2022 CLINICAL DATA:  Short of breath EXAM: CHEST - 2 VIEW COMPARISON:  Radiograph 12/27/2015 FINDINGS: Normal cardiac silhouette. There is patchy densities within the lung similar comparison exam. No focal consolidation. No pleural fluid. No acute osseous findings IMPRESSION: Patchy densities lung related to chronic sickle cell pattern. No clear evidence of acute infection or consolidation. Electronically Signed   By: Suzy Bouchard M.D.   On: 04/30/2022 11:50    Microbiology: No results found for this or any previous visit (from the past 240 hour(s)).   Labs: Basic Metabolic Panel: Recent Labs  Lab 04/30/22 1103  NA 139  K  4.4  CL 103  CO2 27  GLUCOSE 80  BUN 45*  CREATININE 3.09*  CALCIUM 9.2   Liver Function Tests: Recent Labs  Lab 04/30/22 1103  AST 32  ALT 28  ALKPHOS 124  BILITOT 1.0  PROT 6.9  ALBUMIN 3.3*   No results for input(s): "LIPASE", "AMYLASE" in the last 168 hours. No results for input(s): "AMMONIA" in the last 168 hours. CBC: Recent Labs  Lab 04/30/22 1103 05/01/22 0612  WBC 13.0* 13.6*  NEUTROABS 5.8  --   HGB 8.5* 8.2*  HCT 23.9* 22.9*  MCV 116.0* 116.2*  PLT 295 275   Cardiac Enzymes: No results for input(s): "CKTOTAL", "CKMB", "CKMBINDEX", "TROPONINI" in the last 168 hours. BNP: Invalid input(s): "POCBNP" CBG: No results for input(s): "GLUCAP" in the last 168 hours.  Time coordinating discharge: 30 minutes  Signed:  [name]  APRN, MSN, FNP-C Patient [facility] [address] [place], [place] [address] [phone]  [facility] 05/01/2022, 2:22 PM

## 2022-05-02 ENCOUNTER — Telehealth: Payer: Self-pay

## 2022-05-02 NOTE — Transitions of Care (Post Inpatient/ED Visit) (Unsigned)
   05/02/2022  Name: [name] [name]. MRN: [id number] DOB: Jun 28, 1980  Today's TOC FU Call Status: Today's TOC FU Call Status:: Unsuccessul Call (1st Attempt) Unsuccessful Call (1st Attempt) Date: 05/02/22  Attempted to reach the patient regarding the most recent Inpatient/ED visit.  Follow Up Plan: Additional outreach attempts will be made to reach the patient to complete the Transitions of Care (Post Inpatient/ED visit) call.   [place] LPN [place] Advisor Direct Dial [phone]

## 2022-05-03 NOTE — Transitions of Care (Post Inpatient/ED Visit) (Signed)
   05/03/2022  Name: [name] [name]. MRN: [id number] DOB: 08-Dec-1980  Today's TOC FU Call Status: Today's TOC FU Call Status:: Successful TOC FU Call Competed Unsuccessful Call (1st Attempt) Date: 05/02/22 [facility] FU Call Complete Date: 05/03/22  Transition Care Management Follow-up Telephone Call Date of Discharge: 05/01/22 Discharge Facility: [facility]) Type of Discharge: Inpatient Admission Primary Inpatient Discharge Diagnosis:: Sickle cell pain crisis How have you been since you were released from the hospital?: Better Any questions or concerns?: No  Items Reviewed: Did you receive and understand the discharge instructions provided?: Yes Medications obtained and verified?: Yes (Medications Reviewed) Any new allergies since your discharge?: No Dietary orders reviewed?: Yes Do you have support at home?: Yes  Home Care and Equipment/Supplies: [place] Ordered?: No Any new equipment or medical supplies ordered?: No  Functional Questionnaire: Do you need assistance with bathing/showering or dressing?: No Do you need assistance with meal preparation?: No Do you need assistance with eating?: No Do you have difficulty maintaining continence: No Do you need assistance with getting out of bed/getting out of a chair/moving?: No Do you have difficulty managing or taking your medications?: No  Follow up appointments reviewed: PCP Follow-up appointment confirmed?: Yes MD Provider Line Number:[phone] Given: Yes Date of PCP follow-up appointment?: 05/09/22 Follow-up Provider: Cammie Sickle [facility] Follow-up appointment confirmed?: Yes Date of Specialist follow-up appointment?: 05/05/22 Follow-Up Specialty Provider:: sickle cell clinic in [facility] Do you need transportation to your follow-up appointment?: No Do you understand care options if your condition(s) worsen?: Yes-patient verbalized understanding    [place]  LPN [place] Direct Dial [phone]

## 2022-05-05 ENCOUNTER — Other Ambulatory Visit (HOSPITAL_COMMUNITY): Payer: Self-pay

## 2022-05-05 ENCOUNTER — Other Ambulatory Visit: Payer: Self-pay

## 2022-05-05 MED ORDER — HYDROXYUREA 500 MG PO CAPS
500.0000 mg | ORAL_CAPSULE | ORAL | 1 refills | Status: DC
  Filled 2022-05-05: qty 112, 30d supply, fill #0

## 2022-05-05 NOTE — Telephone Encounter (Signed)
Please advise K[facility] 

## 2022-05-08 ENCOUNTER — Other Ambulatory Visit: Payer: Self-pay | Admitting: Family Medicine

## 2022-05-08 ENCOUNTER — Telehealth: Payer: Self-pay | Admitting: Family Medicine

## 2022-05-08 ENCOUNTER — Other Ambulatory Visit (HOSPITAL_COMMUNITY): Payer: Self-pay

## 2022-05-08 DIAGNOSIS — G894 Chronic pain syndrome: Secondary | ICD-10-CM

## 2022-05-08 DIAGNOSIS — D571 Sickle-cell disease without crisis: Secondary | ICD-10-CM

## 2022-05-08 MED ORDER — OXYCODONE HCL 10 MG PO TABS
10.0000 mg | ORAL_TABLET | Freq: Four times a day (QID) | ORAL | 0 refills | Status: DC | PRN
  Filled 2022-05-08: qty 60, 15d supply, fill #0

## 2022-05-08 NOTE — Progress Notes (Signed)
Reviewed PDMP substance reporting system prior to prescribing opiate medications. No inconsistencies noted.  Meds ordered this encounter  Medications   Oxycodone HCl 10 MG TABS    Sig: Take 1 tablet (10 mg total) by mouth every 6 (six) hours as needed for up to 15 days (pain).    Dispense:  60 tablet    Refill:  0    Order Specific Question:   Supervising Provider    Answer:   [name] [id number]   [name]  APRN, MSN, FNP-C Patient [place] [address] [place], [place] [address] [phone]

## 2022-05-08 NOTE — Telephone Encounter (Signed)
Kolin Dubs is a 42 year old male with a medical history significant for sickle cell disease, chronic pain syndrome, opiate dependence and tolerance, and anemia of chronic disease has requested prescription refill for OxyContin.  Patient last filled OxyContin on 04/20/2022.  He will not be able to fill this prescription prior to 05/21/2022.  He will need to make a new prescription request closer to that time.  No exceptions.   [name]  APRN, MSN, FNP-C Patient [facility] [address] [place], [place] [address] [phone]

## 2022-05-08 NOTE — Telephone Encounter (Signed)
Please advise K[facility] 

## 2022-05-09 ENCOUNTER — Encounter (HOSPITAL_COMMUNITY): Payer: Self-pay | Admitting: Family Medicine

## 2022-05-09 ENCOUNTER — Telehealth (HOSPITAL_COMMUNITY): Payer: Self-pay

## 2022-05-09 ENCOUNTER — Non-Acute Institutional Stay (HOSPITAL_COMMUNITY)
Admission: AD | Admit: 2022-05-09 | Discharge: 2022-05-09 | Disposition: A | Discharge status: Home / Self Care | Payer: Medicare Other | Source: Ambulatory Visit | Attending: Internal Medicine | Admitting: Internal Medicine

## 2022-05-09 ENCOUNTER — Other Ambulatory Visit: Payer: Self-pay | Admitting: Family Medicine

## 2022-05-09 ENCOUNTER — Ambulatory Visit: Payer: Self-pay | Admitting: Family Medicine

## 2022-05-09 DIAGNOSIS — K219 Gastro-esophageal reflux disease without esophagitis: Secondary | ICD-10-CM | POA: Diagnosis not present

## 2022-05-09 DIAGNOSIS — Z79899 Other long term (current) drug therapy: Secondary | ICD-10-CM | POA: Insufficient documentation

## 2022-05-09 DIAGNOSIS — N184 Chronic kidney disease, stage 4 (severe): Secondary | ICD-10-CM | POA: Diagnosis not present

## 2022-05-09 DIAGNOSIS — G894 Chronic pain syndrome: Secondary | ICD-10-CM | POA: Insufficient documentation

## 2022-05-09 DIAGNOSIS — F17210 Nicotine dependence, cigarettes, uncomplicated: Secondary | ICD-10-CM | POA: Diagnosis not present

## 2022-05-09 DIAGNOSIS — F1120 Opioid dependence, uncomplicated: Secondary | ICD-10-CM | POA: Insufficient documentation

## 2022-05-09 DIAGNOSIS — F1290 Cannabis use, unspecified, uncomplicated: Secondary | ICD-10-CM | POA: Diagnosis not present

## 2022-05-09 DIAGNOSIS — D5700 Hb-SS disease with crisis, unspecified: Secondary | ICD-10-CM | POA: Diagnosis not present

## 2022-05-09 DIAGNOSIS — D638 Anemia in other chronic diseases classified elsewhere: Secondary | ICD-10-CM | POA: Insufficient documentation

## 2022-05-09 LAB — CBC WITH DIFFERENTIAL/PLATELET
Abs Immature Granulocytes: 0.05 10*3/uL (ref 0.00–0.07)
Basophils Absolute: 0 10*3/uL (ref 0.0–0.1)
Basophils Relative: 0 %
Eosinophils Absolute: 0.3 10*3/uL (ref 0.0–0.5)
Eosinophils Relative: 2 %
HCT: 24.1 % — ABNORMAL LOW (ref 39.0–52.0)
Hemoglobin: 8.6 g/dL — ABNORMAL LOW (ref 13.0–17.0)
Immature Granulocytes: 0 %
Lymphocytes Relative: 41 %
Lymphs Abs: 5.8 10*3/uL — ABNORMAL HIGH (ref 0.7–4.0)
MCH: 41.5 pg — ABNORMAL HIGH (ref 26.0–34.0)
MCHC: 35.7 g/dL (ref 30.0–36.0)
MCV: 116.4 fL — ABNORMAL HIGH (ref 80.0–100.0)
Monocytes Absolute: 1.4 10*3/uL — ABNORMAL HIGH (ref 0.1–1.0)
Monocytes Relative: 10 %
Neutro Abs: 6.5 10*3/uL (ref 1.7–7.7)
Neutrophils Relative %: 47 %
Platelets: 270 10*3/uL (ref 150–400)
RBC: 2.07 MIL/uL — ABNORMAL LOW (ref 4.22–5.81)
RDW: 17.2 % — ABNORMAL HIGH (ref 11.5–15.5)
WBC: 14 10*3/uL — ABNORMAL HIGH (ref 4.0–10.5)
nRBC: 0 % (ref 0.0–0.2)

## 2022-05-09 LAB — COMPREHENSIVE METABOLIC PANEL
ALT: 42 U/L (ref 0–44)
AST: 48 U/L — ABNORMAL HIGH (ref 15–41)
Albumin: 3.5 g/dL (ref 3.5–5.0)
Alkaline Phosphatase: 147 U/L — ABNORMAL HIGH (ref 38–126)
Anion gap: 12 (ref 5–15)
BUN: 59 mg/dL — ABNORMAL HIGH (ref 6–20)
CO2: 22 mmol/L (ref 22–32)
Calcium: 8.6 mg/dL — ABNORMAL LOW (ref 8.9–10.3)
Chloride: 100 mmol/L (ref 98–111)
Creatinine, Ser: 3.34 mg/dL — ABNORMAL HIGH (ref 0.61–1.24)
GFR, Estimated: 23 mL/min — ABNORMAL LOW (ref >60)
Glucose, Bld: 91 mg/dL (ref 70–99)
Potassium: 4.5 mmol/L (ref 3.5–5.1)
Sodium: 134 mmol/L — ABNORMAL LOW (ref 135–145)
Total Bilirubin: 1 mg/dL (ref 0.3–1.2)
Total Protein: 7.2 g/dL (ref 6.5–8.1)

## 2022-05-09 LAB — RETICULOCYTES
Immature Retic Fract: 29.1 % — ABNORMAL HIGH (ref 2.3–15.9)
RBC.: 2.06 MIL/uL — ABNORMAL LOW (ref 4.22–5.81)
Retic Count, Absolute: 66.3 10*3/uL (ref 19.0–186.0)
Retic Ct Pct: 3.2 % — ABNORMAL HIGH (ref 0.4–3.1)

## 2022-05-09 LAB — LACTATE DEHYDROGENASE: LDH: 143 U/L (ref 98–192)

## 2022-05-09 MED ORDER — ONDANSETRON HCL 4 MG/2ML IJ SOLN: 4.0000 mg | Freq: Four times a day (QID) | INTRAMUSCULAR | Status: DC | PRN

## 2022-05-09 MED ORDER — SODIUM CHLORIDE 0.9% FLUSH: 9.0000 mL | INTRAVENOUS | Status: DC | PRN

## 2022-05-09 MED ORDER — DIPHENHYDRAMINE HCL 25 MG PO CAPS
25.0000 mg | ORAL_CAPSULE | ORAL | Status: DC | PRN
  Administered 2022-05-09: 25 mg via ORAL
  Filled 2022-05-09: qty 1

## 2022-05-09 MED ORDER — NALOXONE HCL 0.4 MG/ML IJ SOLN: 0.4000 mg | INTRAMUSCULAR | Status: DC | PRN

## 2022-05-09 MED ORDER — SODIUM CHLORIDE 0.45 % IV SOLN: INTRAVENOUS | Status: DC

## 2022-05-09 MED ORDER — HYDROMORPHONE 1 MG/ML IV SOLN
INTRAVENOUS | Status: DC
  Administered 2022-05-09: 30 mg via INTRAVENOUS
  Administered 2022-05-09: 10 mg via INTRAVENOUS
  Filled 2022-05-09: qty 30

## 2022-05-09 MED ORDER — METHYLPREDNISOLONE SODIUM SUCC 125 MG IJ SOLR
125.0000 mg | Freq: Once | INTRAMUSCULAR | Status: AC
  Administered 2022-05-09: 125 mg via INTRAVENOUS
  Filled 2022-05-09: qty 2

## 2022-05-09 MED ORDER — ACETAMINOPHEN 500 MG PO TABS
1000.0000 mg | ORAL_TABLET | Freq: Once | ORAL | Status: AC
  Administered 2022-05-09: 1000 mg via ORAL
  Filled 2022-05-09: qty 2

## 2022-05-09 NOTE — Progress Notes (Signed)
Pt admitted to day hospital today for sickle cell pain treatment. On arrival, pt reports 10/10 pain to bilateral legs. Pt received Dilaudid PCA, 125 mg Solu-medrol, and hydrated with IV fluids via PIV. Pt also received 1,000 mg PO Tylenol and 25 mg PO Benadryl. At discharge, pt rates pain 4/10. Pt is alert, oriented, and ambulatory at discharge.

## 2022-05-09 NOTE — H&P (Signed)
[facility] History and Physical   Date: 05/09/2022  Patient name: [name] [name]. Medical record number: [id number] Date of birth: 08/02/1980 Age: 42 y.o. Gender: male PCP: [name], FNP  Attending physician: [name], MD  Chief Complaint: Sickle cell pain   History of Present Illness: [name] [name] is a 42 year old male with a medical history significant for sickle cell disease, chronic pain syndrome, opiate dependence and tolerance, chronic marijuana use, chronic kidney disease stage IV, and anemia of chronic disease presents with complaints of bilateral knee pain and swelling over the past several days.  Pain and swelling is consistent with his typical sickle cell crisis.  Patient is followed by orthopedic specialist at [facility] and has received injections in the past for bilateral knee pain.  Patient states that he has an appointment scheduled on May 29, 2022 for this problem.  Overnight, patient states that bilateral knee swelling increased and it was difficult to bear weight.  He rates pain at 9/10.  He last had OxyContin and oxycodone this a.m. without sustained relief. Patient states that pain is constant and throbbing.  He denies any fever, chills, chest pain, or shortness of breath.  No urinary symptoms, nausea, vomiting, or diarrhea.  No sick contacts, recent travel, or known exposure to COVID-19.  Meds: Medications Prior to Admission  Medication Sig Dispense Refill Last Dose   amitriptyline (ELAVIL) 25 MG tablet Take 50 mg by mouth at bedtime.   05/08/2022   Deferiprone, Twice Daily, (FERRIPROX TWICE-A-DAY) 1000 MG TABS Take 3,000 mg by mouth daily. 180 tablet 0 [id number]   folic acid (FOLVITE) 1 MG tablet TAKE 1 TABLET BY MOUTH EVERY DAY (Patient taking differently: Take 1 mg by mouth every morning.) 90 tablet 4 05/09/2022   hydroxyurea (HYDREA) 500 MG capsule Take 1 capsule by mouth in the morning on Sun/Tues/Wed/Thurs/Sat and 2 capsules  on Mon/Fri 260 capsule 1 05/09/2022   oxyCODONE (OXYCONTIN) 15 mg 12 hr tablet Take 1 tablet (15 mg total) by mouth every 12 (twelve) hours. 60 tablet 0 05/09/2022   sodium bicarbonate 650 MG tablet Take 1,950 mg by mouth 2 (two) times daily.   05/09/2022   VELTASSA 16.8 g PACK Take 16.8 g by mouth daily.   05/09/2022   voxelotor (OXBRYTA) 500 MG TABS tablet Take 1,500 mg by mouth daily with breakfast.   05/09/2022   calcitRIOL (ROCALTROL) 0.25 MCG capsule Take 0.5 mcg by mouth daily with breakfast.      Oxycodone HCl 10 MG TABS Take 1 tablet (10 mg total) by mouth every 6 (six) hours as needed for up to 15 days (pain). 60 tablet 0    TYLENOL 8 HOUR ARTHRITIS PAIN 650 MG CR tablet Take 650 mg by mouth every 8 (eight) hours as needed for pain.       Allergies: Nsaids and Morphine and related Past Medical History:  Diagnosis Date   Abscess of right iliac fossa 09/24/2018   required Perc Drain 09/24/2018   Arachnoid Cyst of brain bilaterally    "2 really small ones in the back of my head; inside; saw them w/MRI" (09/25/2012)   Bacterial pneumonia ~ 2012   "caught it here in the hospital" (09/25/2012)   Chronic kidney disease    "from my sickle cell" (09/25/2012)   CKD (chronic kidney disease), stage II    Colitis 04/19/2017   CT scan abd/ pelvis   GERD (gastroesophageal reflux disease)    "after I eat  alot of spicey foods" (09/25/2012)   Gynecomastia, male 07/10/2012   History of blood transfusion    "always related to sickle cell crisis" (09/25/2012)   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   Noted in noted from Hematology notes at [facility]    "take RX qd to prevent them" (09/25/2012)   Opioid dependence with withdrawal ([place]) 08/30/2012   Renal insufficiency    Sickle cell anemia (HCC)    Sickle cell crisis ([place]) 09/25/2012   Sickle cell nephropathy ([place]) 07/10/2012   Sinus tachycardia    Tachycardia with heart rate 121-140 beats per minute with ambulation 08/04/2016    Past Surgical History:  Procedure Laterality Date   ABCESS DRAINAGE     CHOLECYSTECTOMY  ~ 2012   COLONOSCOPY N/A 04/23/2017   Procedure: COLONOSCOPY;  Surgeon: Irene Shipper, MD;  Location: [facility] ENDOSCOPY;  Service: Endoscopy;  Laterality: N/A;   IR FLUORO GUIDE CV LINE RIGHT  12/17/2016   IR FLUORO GUIDE CV LINE RIGHT  02/07/2021   IR RADIOLOGIST EVAL & MGMT  10/02/2018   IR RADIOLOGIST EVAL & MGMT  10/15/2018   IR REMOVAL TUN CV CATH W/O FL  12/21/2016   IR REMOVAL TUN CV CATH W/O FL  02/11/2021   IR US GUIDE VASC ACCESS RIGHT  12/17/2016   IR US GUIDE VASC ACCESS RIGHT  02/07/2021   spleenectomy     Family History  Problem Relation Age of Onset   Breast cancer Mother    Social History   Socioeconomic History   Marital status: Single    Spouse name: Not on file   Number of children: Not on file   Years of education: Not on file   Highest education level: Not on file  Occupational History   Not on file  Tobacco Use   Smoking status: Some Days    Packs/day: .1    Types: Cigarettes   Smokeless tobacco: Never  Vaping Use   Vaping Use: Never used  Substance and Sexual Activity   Alcohol use: Yes    Comment: Once a month   Drug use: Yes    Types: Marijuana    Comment: Every 2-3 weeks    Sexual activity: Yes    Birth control/protection: Condom  Other Topics Concern   Not on file  Social History Narrative    Lives alone in [place].   Back at school, studying Public relations account executive. Unemployed.    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    Social Determinants of Health   Financial Resource Strain: Low Risk  (02/09/2022)   Overall Financial Resource Strain (CARDIA)    Difficulty of Paying Living Expenses: Not hard at all  Food Insecurity: Food Insecurity Present (05/01/2022)   Hunger Vital Sign    Worried About Running Out of  Food in the Last Year: Sometimes true    Ran Out of Food in the Last Year: Sometimes true  Transportation Needs: No Transportation Needs (05/01/2022)   PRAPARE - Hydrologist (Medical): No    Lack of Transportation (Non-Medical): No  Physical Activity: Sufficiently Active (02/09/2022)   Exercise Vital Sign    Days of Exercise per Week: 7 days    Minutes of Exercise per Session: 30 min  Stress: No Stress Concern Present (02/09/2022)   [employer]  [employer] - Occupational Stress Questionnaire    Feeling of Stress : Not at all  Social Connections: Socially Isolated (02/09/2022)   Social Connection and Isolation Panel [NHANES]    Frequency of Communication with Friends and Family: More than three times a week    Frequency of Social Gatherings with Friends and Family: Once a week    Attends Religious Services: Never    Marine scientist or Organizations: No    Attends Archivist Meetings: Never    Marital Status: Divorced  Human resources officer Violence: Not At Risk (05/01/2022)   Humiliation, Afraid, Rape, and Kick questionnaire    Fear of Current or Ex-Partner: No    Emotionally Abused: No    Physically Abused: No    Sexually Abused: No   Review of Systems  Constitutional: Negative.   Eyes: Negative.   Respiratory: Negative.    Cardiovascular: Negative.   Gastrointestinal: Negative.   Genitourinary: Negative.   Musculoskeletal:  Positive for back pain and joint pain.  Skin: Negative.   Neurological: Negative.   Psychiatric/Behavioral: Negative.      Physical Exam: Blood pressure 113/71, pulse (!) 105, temperature 97.8 F (36.6 C), temperature source Temporal, SpO2 100 %. Physical Exam Constitutional:      Appearance: Normal appearance.  Eyes:     Pupils: Pupils are equal, round, and reactive to light.  Cardiovascular:     Rate and Rhythm: Normal rate and regular rhythm.  Pulmonary:     Effort: Pulmonary effort is  normal.  Abdominal:     General: Bowel sounds are normal.  Musculoskeletal:     Right knee: Swelling present. Decreased range of motion. Tenderness present.     Left knee: Swelling present. Decreased range of motion. Tenderness present.  Skin:    General: Skin is warm.  Neurological:     General: No focal deficit present.     Mental Status: He is alert. Mental status is at baseline.  Psychiatric:        Mood and Affect: Mood normal.        Behavior: Behavior normal.        Thought Content: Thought content normal.        Judgment: Judgment normal.      Lab results: Results for orders placed or performed during the hospital encounter of 05/09/22 (from the past 24 hour(s))  Comprehensive metabolic panel     Status: Abnormal   Collection Time: 05/09/22  9:03 AM  Result Value Ref Range   Sodium 134 (L) 135 - 145 mmol/L   Potassium 4.5 3.5 - 5.1 mmol/L   Chloride 100 98 - 111 mmol/L   CO2 22 22 - 32 mmol/L   Glucose, Bld 91 70 - 99 mg/dL   BUN 59 (H) 6 - 20 mg/dL   Creatinine, Ser 3.34 (H) 0.61 - 1.24 mg/dL   Calcium 8.6 (L) 8.9 - 10.3 mg/dL   Total Protein 7.2 6.5 - 8.1 g/dL   Albumin 3.5 3.5 - 5.0 g/dL   AST 48 (H) 15 - 41 U/L   ALT 42 0 - 44 U/L   Alkaline Phosphatase 147 (H) 38 - 126 U/L   Total Bilirubin 1.0 0.3 - 1.2 mg/dL   GFR, Estimated 23 (L) >60 mL/min   Anion gap 12 5 - 15  Lactate dehydrogenase     Status: None   Collection Time: 05/09/22  9:03 AM  Result Value Ref Range   LDH 143 98 - 192 U/L  CBC  with Differential     Status: Abnormal   Collection Time: 05/09/22  9:03 AM  Result Value Ref Range   WBC 14.0 (H) 4.0 - 10.5 K/uL   RBC 2.07 (L) 4.22 - 5.81 MIL/uL   Hemoglobin 8.6 (L) 13.0 - 17.0 g/dL   HCT 24.1 (L) 39.0 - 52.0 %   MCV 116.4 (H) 80.0 - 100.0 fL   MCH 41.5 (H) 26.0 - 34.0 pg   MCHC 35.7 30.0 - 36.0 g/dL   RDW 17.2 (H) 11.5 - 15.5 %   Platelets 270 150 - 400 K/uL   nRBC 0.0 0.0 - 0.2 %   Neutrophils Relative % 47 %   Neutro Abs 6.5 1.7 - 7.7  K/uL   Lymphocytes Relative 41 %   Lymphs Abs 5.8 (H) 0.7 - 4.0 K/uL   Monocytes Relative 10 %   Monocytes Absolute 1.4 (H) 0.1 - 1.0 K/uL   Eosinophils Relative 2 %   Eosinophils Absolute 0.3 0.0 - 0.5 K/uL   Basophils Relative 0 %   Basophils Absolute 0.0 0.0 - 0.1 K/uL   Immature Granulocytes 0 %   Abs Immature Granulocytes 0.05 0.00 - 0.07 K/uL   Polychromasia PRESENT    Sickle Cells PRESENT    Target Cells PRESENT   Reticulocytes     Status: Abnormal   Collection Time: 05/09/22  9:03 AM  Result Value Ref Range   Retic Ct Pct 3.2 (H) 0.4 - 3.1 %   RBC. 2.06 (L) 4.22 - 5.81 MIL/uL   Retic Count, Absolute 66.3 19.0 - 186.0 K/uL   Immature Retic Fract 29.1 (H) 2.3 - 15.9 %   *Note: Due to a large number of results and/or encounters for the requested time period, some results have not been displayed. A complete set of results can be found in Results Review.    Imaging results:  No results found.   Assessment & Plan: Patient admitted to sickle cell day infusion center for management of pain crisis.  Patient is opiate tolerant Initiate IV dilaudid PCA. IV fluids, 0.45% saline at 100 mL Tylenol 1000 mg by mouth times one dose Review CBC with differential, complete metabolic panel, and reticulocytes as results become available.  Pain intensity will be reevaluated in context of functioning and relationship to baseline as care progresses If pain intensity remains elevated and/or sudden change in hemodynamic stability transition to inpatient services for higher level of care.      [name]  APRN, MSN, FNP-C Patient [facility] [address] [place], [place] [address] [phone]  05/09/2022, 10:31 AM

## 2022-05-09 NOTE — Discharge Summary (Signed)
[facility] Discharge Summary   Patient ID: [name] [name]. MRN: [id number] DOB/AGE: [birth year] 42 y.o.  Admit date: 05/09/2022 Discharge date: 05/09/2022  Primary Care Physician:  [name], FNP  Admission Diagnoses:  Active Problems:   Sickle cell pain crisis [facility])   Discharge Medications:  Allergies as of 05/09/2022       Reactions   Nsaids Other (See Comments)   Kidney disease   Morphine And Related Other (See Comments)   "Real bad headaches"         Medication List     TAKE these medications    amitriptyline 25 MG tablet Commonly known as: ELAVIL Take 50 mg by mouth at bedtime.   calcitRIOL 0.25 MCG capsule Commonly known as: ROCALTROL Take 0.5 mcg by mouth daily with breakfast.   Ferriprox Twice-A-Day 1000 MG Tabs Generic drug: Deferiprone (Twice Daily) Take 3,000 mg by mouth daily.   folic acid 1 MG tablet Commonly known as: FOLVITE TAKE 1 TABLET BY MOUTH EVERY DAY What changed: when to take this   hydroxyurea 500 MG capsule Commonly known as: HYDREA Take 1 capsule by mouth in the morning on Sun/Tues/Wed/Thurs/Sat and 2 capsules on Mon/Fri   Oxbryta 500 MG Tabs tablet Generic drug: voxelotor Take 1,500 mg by mouth daily with breakfast.   OxyCONTIN 15 mg 12 hr tablet Generic drug: oxyCODONE Take 1 tablet (15 mg total) by mouth every 12 (twelve) hours.   Oxycodone HCl 10 MG Tabs Take 1 tablet (10 mg total) by mouth every 6 (six) hours as needed for up to 15 days (pain).   sodium bicarbonate 650 MG tablet Take 1,950 mg by mouth 2 (two) times daily.   Tylenol 8 Hour Arthritis Pain 650 MG CR tablet Generic drug: acetaminophen Take 650 mg by mouth every 8 (eight) hours as needed for pain.   Veltassa 16.8 g Pack Generic drug: Patiromer Sorbitex Calcium Take 16.8 g by mouth daily.         Consults:  None  Significant Diagnostic Studies:  DG Chest 2 View  Result Date: 04/30/2022 CLINICAL DATA:  Short of  breath EXAM: CHEST - 2 VIEW COMPARISON:  Radiograph 12/27/2015 FINDINGS: Normal cardiac silhouette. There is patchy densities within the lung similar comparison exam. No focal consolidation. No pleural fluid. No acute osseous findings IMPRESSION: Patchy densities lung related to chronic sickle cell pattern. No clear evidence of acute infection or consolidation. Electronically Signed   By: Suzy Bouchard M.D.   On: 04/30/2022 11:50   History of Present Illness: [name] [name] is a 42 year old male with a medical history significant for sickle cell disease, chronic pain syndrome, opiate dependence and tolerance, chronic marijuana use, chronic kidney disease stage IV, and anemia of chronic disease presents with complaints of bilateral knee pain and swelling over the past several days.  Pain and swelling is consistent with his typical sickle cell crisis.  Patient is followed by orthopedic specialist at [facility] and has received injections in the past for bilateral knee pain.  Patient states that he has an appointment scheduled on May 29, 2022 for this problem.  Overnight, patient states that bilateral knee swelling increased and it was difficult to bear weight.  He rates pain at 9/10.  He last had OxyContin and oxycodone this a.m. without sustained relief. Patient states that pain is constant and throbbing.  He denies any fever, chills, chest pain, or shortness of breath.  No urinary symptoms, nausea, vomiting, or diarrhea.  No sick contacts, recent travel, or known exposure to COVID-19.  [facility] Course: Seydou was admitted to sickle cell day infusion clinic for further management of his pain crisis.  Review laboratory values, creatinine 3.34, which appears to be consistent with his baseline.  All other laboratory values are consistent with this patient's baseline. Patient complaining of severe bilateral knee pain.  He has significant swelling, no erythema. Pain managed with Solu-Medrol 125  mg x 1 IV Dilaudid PCA Tylenol 1000 mg x 1 IV fluids, 0.45% saline at 100 mL/h.  Patient states that pain intensity decreased throughout admission and requesting discharge home.  He is alert, oriented, and ambulating with minimal assistance. Patient will discharge home in a hemodynamically stable condition.  Physical Exam at Discharge:  BP 106/63 (BP Location: Left Arm)   Pulse 84   Temp 97.8 F (36.6 C) (Temporal)   Resp 10   SpO2 98%   Physical Exam Constitutional:      Appearance: Normal appearance.  Eyes:     Pupils: Pupils are equal, round, and reactive to light.  Cardiovascular:     Rate and Rhythm: Normal rate and regular rhythm.     Pulses: Normal pulses.  Pulmonary:     Effort: Pulmonary effort is normal.  Abdominal:     General: Bowel sounds are normal.  Musculoskeletal:        General: Normal range of motion.     Right knee: Swelling present.     Left knee: Swelling present.  Skin:    General: Skin is warm.  Neurological:     General: No focal deficit present.     Mental Status: He is alert. Mental status is at baseline.  Psychiatric:        Mood and Affect: Mood normal.        Thought Content: Thought content normal.        Judgment: Judgment normal.       Disposition at Discharge: There are no questions and answers to display.        Discharge Orders:   Condition at Discharge:   Stable  Time spent on Discharge:  Greater than 30 minutes.  Signed: [name]  APRN, MSN, FNP-C [facility] [address] [place], [place] [address] [phone]  05/09/2022, 3:22 PM

## 2022-05-09 NOTE — Telephone Encounter (Signed)
Pt called day hospital wanting to come in today for sickle cell pain treatment. Pt reports 10/10 pain to both legs, and swelling of the left knee. Pt reports taking Oxycodone 10 mg at 0630 AM. Pt denies being in ER. Pt screened for COVID and denies symptoms and exposures. Pt denies fever, chest pain, N/V/D, abdominal pain, and priapism. [place], [place] notified and she said pt can come to day hospital today. Pt notified and verbalized understanding. Pt states that his mom will be his transportation at discharge today.

## 2022-05-14 IMAGING — CR DG CHEST 2V
2 series · 2 of 2 positions shown · non-contrast
Comparison: Chest radiograph 10/22/2020

CLINICAL DATA: Chest pain, history of sickle cell

EXAM:
CHEST - 2 VIEW

[w chest pa]
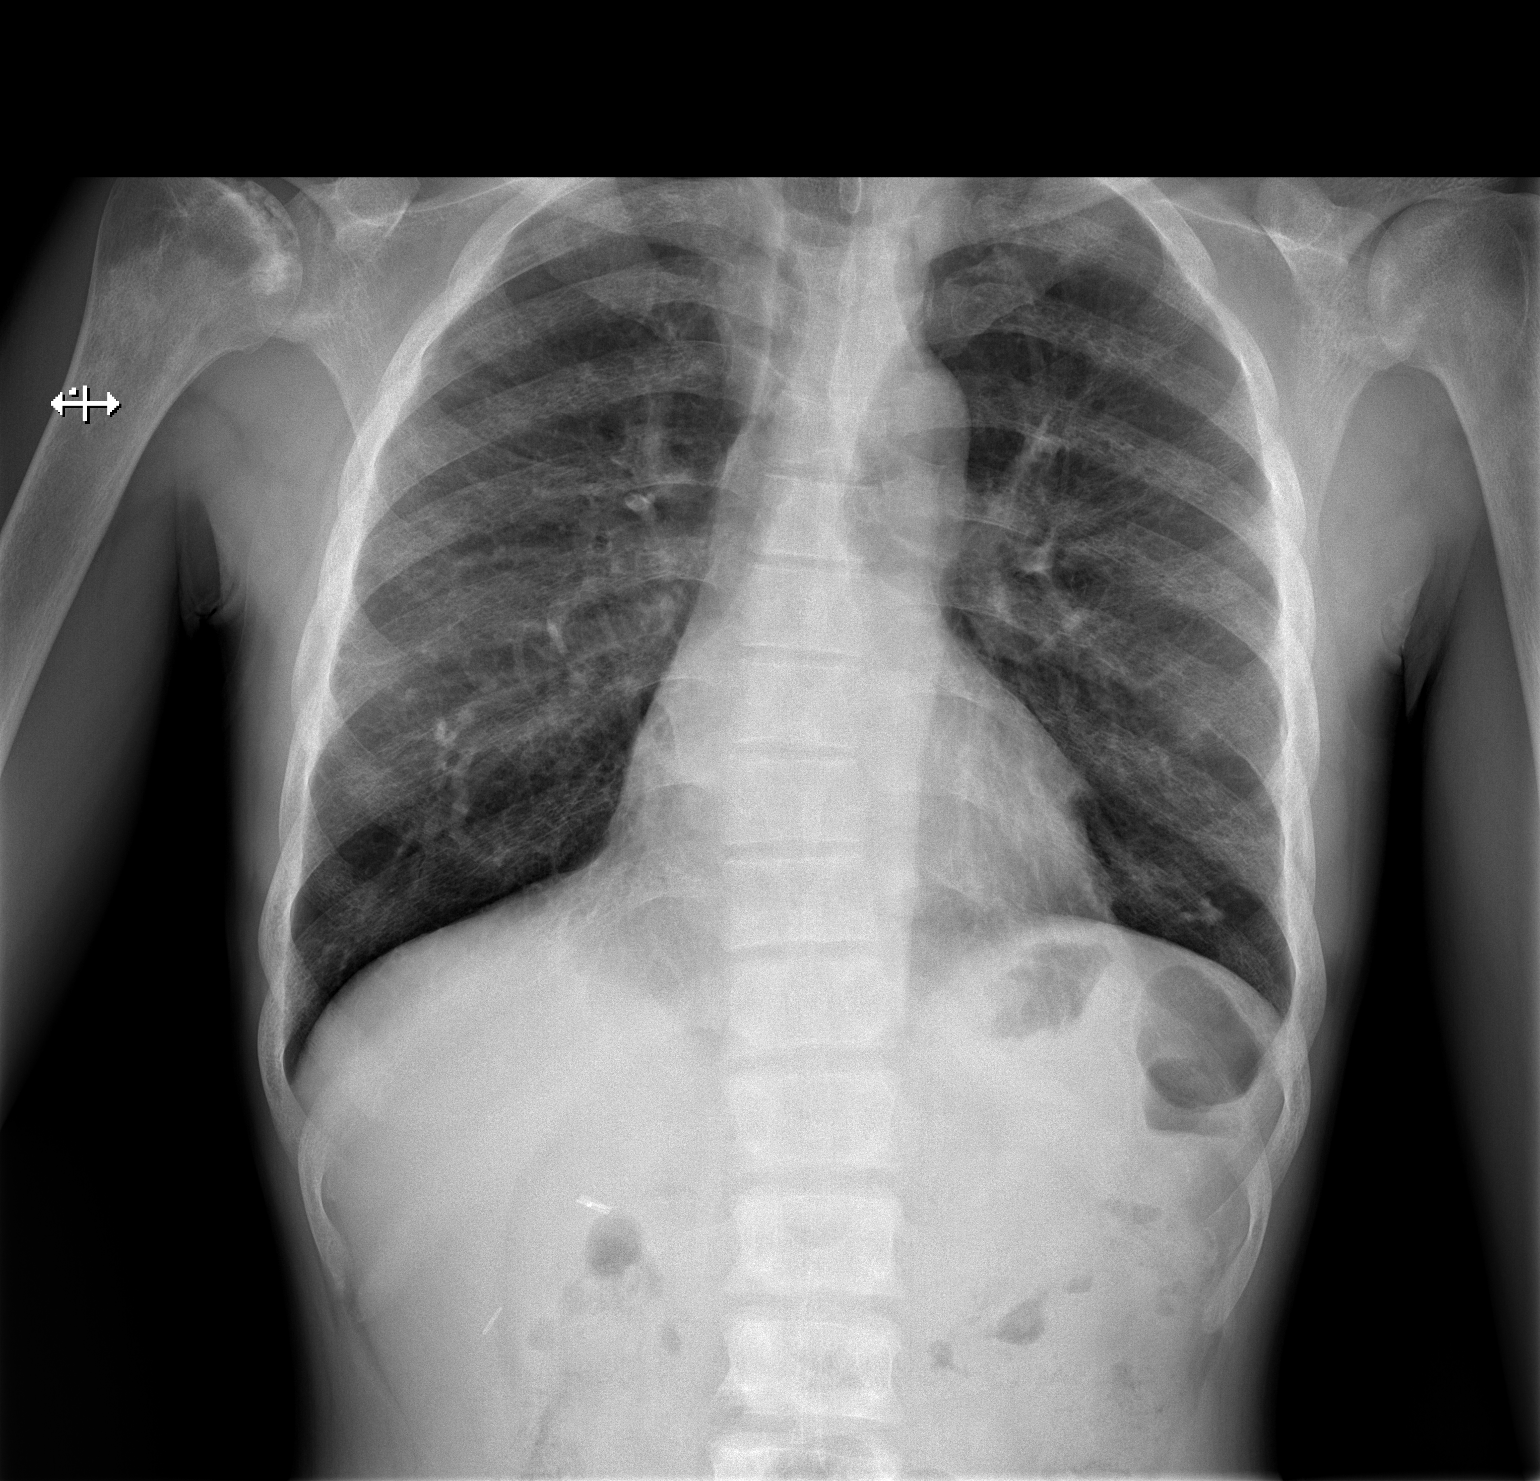

[w chest lat]
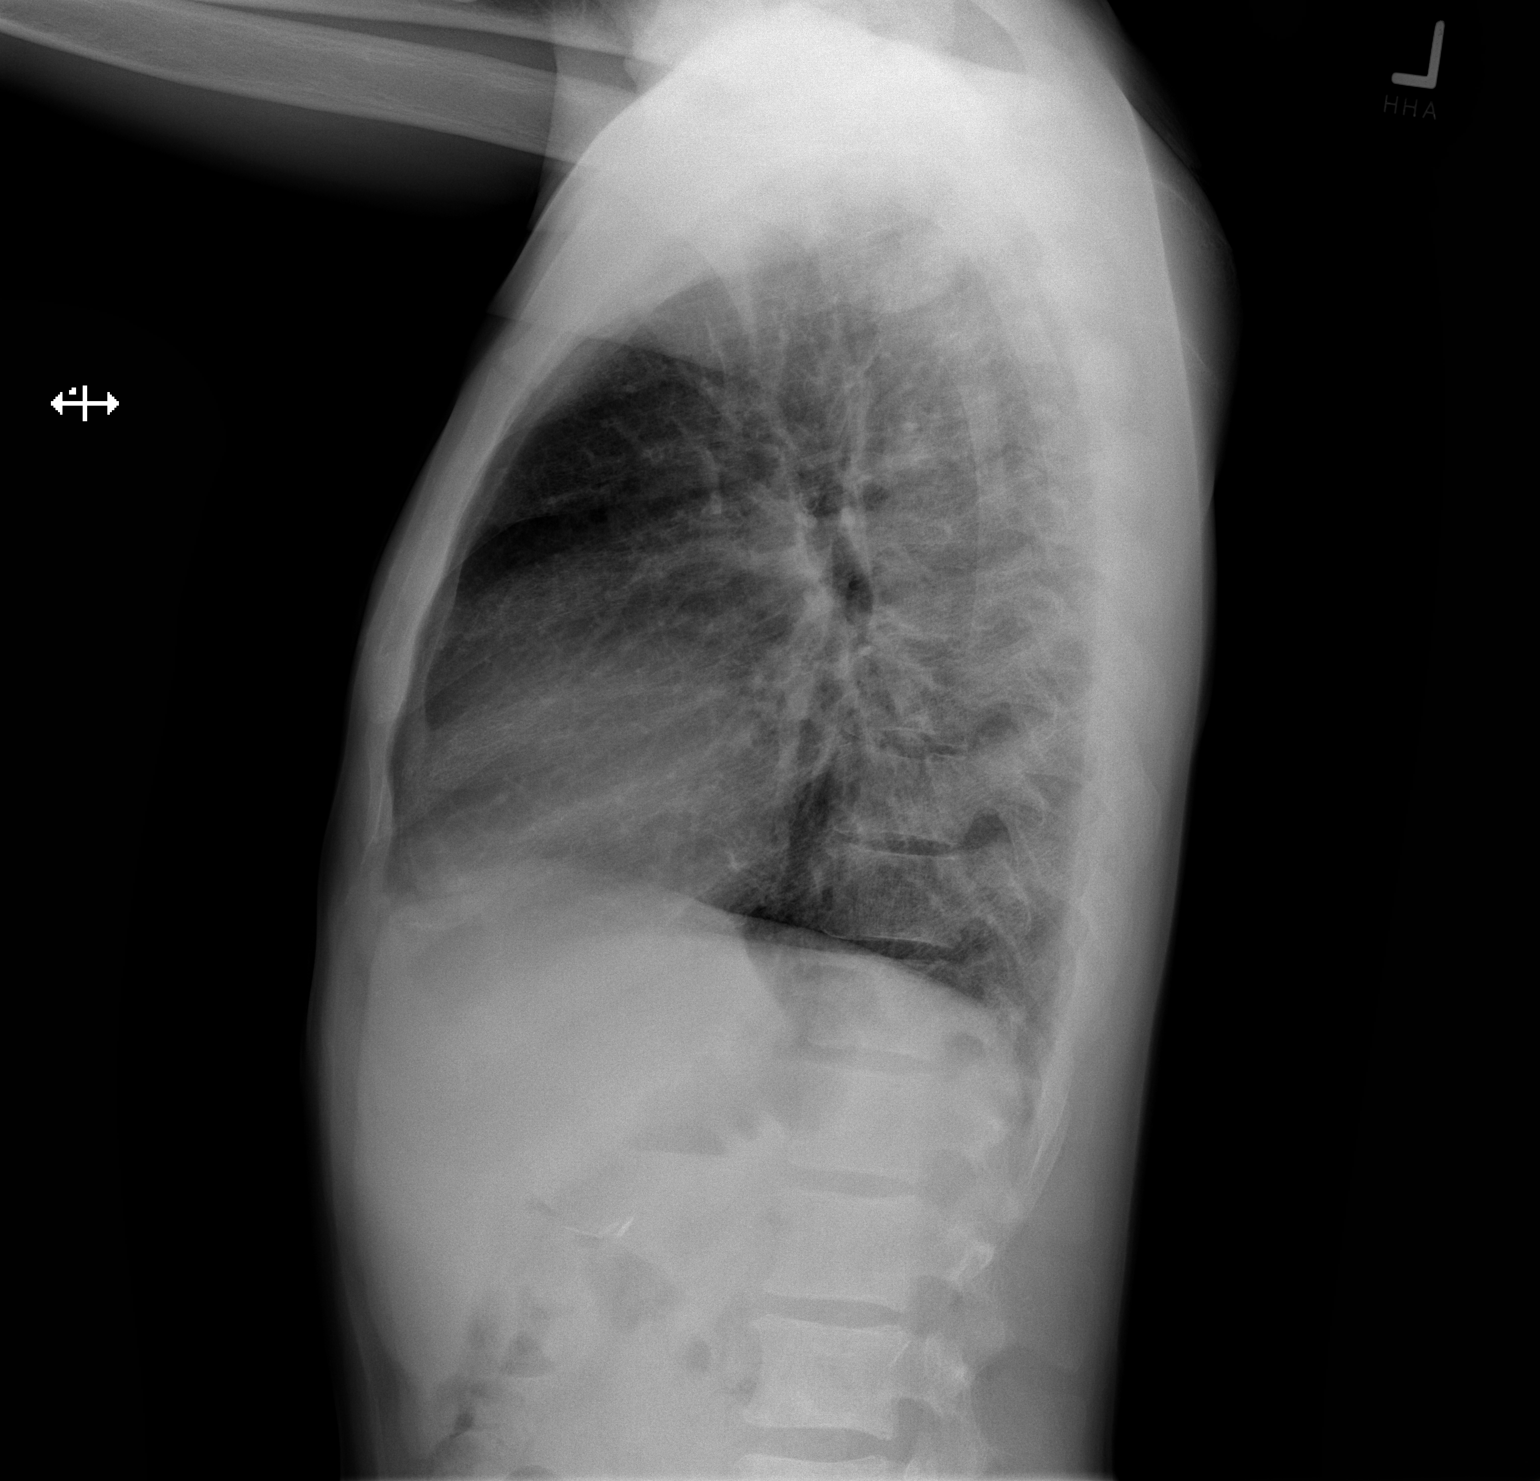

[2 of 2 positions shown; findings below may reference images not displayed]

FINDINGS: The cardiomediastinal silhouette is normal.

There is no focal consolidation or pulmonary edema. There is no
pleural effusion or pneumothorax.

Avascular necrosis of the right humeral head is again seen. There is
no acute osseous abnormality. Cholecystectomy clips are noted.
IMPRESSION: No radiographic evidence of acute cardiopulmonary process.

## 2022-05-15 ENCOUNTER — Other Ambulatory Visit (HOSPITAL_COMMUNITY): Payer: Self-pay

## 2022-05-21 ENCOUNTER — Other Ambulatory Visit: Payer: Self-pay | Admitting: Family Medicine

## 2022-05-21 DIAGNOSIS — G894 Chronic pain syndrome: Secondary | ICD-10-CM

## 2022-05-21 DIAGNOSIS — D571 Sickle-cell disease without crisis: Secondary | ICD-10-CM

## 2022-05-22 ENCOUNTER — Other Ambulatory Visit (HOSPITAL_COMMUNITY): Payer: Self-pay

## 2022-05-22 ENCOUNTER — Other Ambulatory Visit: Payer: Self-pay | Admitting: Family Medicine

## 2022-05-22 DIAGNOSIS — G894 Chronic pain syndrome: Secondary | ICD-10-CM

## 2022-05-22 DIAGNOSIS — D571 Sickle-cell disease without crisis: Secondary | ICD-10-CM

## 2022-05-22 MED ORDER — OXYCODONE HCL 10 MG PO TABS
10.0000 mg | ORAL_TABLET | Freq: Four times a day (QID) | ORAL | 0 refills | Status: DC | PRN
  Filled 2022-05-22: qty 60, 15d supply, fill #0

## 2022-05-22 MED ORDER — OXYCODONE HCL ER 15 MG PO T12A
15.0000 mg | EXTENDED_RELEASE_TABLET | Freq: Two times a day (BID) | ORAL | 0 refills | Status: DC
  Filled 2022-05-22: qty 60, 30d supply, fill #0

## 2022-05-22 NOTE — Progress Notes (Signed)
Reviewed PDMP substance reporting system prior to prescribing opiate medications. No inconsistencies noted.  Meds ordered this encounter  Medications   oxyCODONE (OXYCONTIN) 15 mg 12 hr tablet    Sig: Take 1 tablet (15 mg total) by mouth every 12 (twelve) hours.    Dispense:  60 tablet    Refill:  0    Order Specific Question:   Supervising Provider    Answer:   Quentin Angst [[id number]]   Oxycodone HCl 10 MG TABS    Sig: Take 1 tablet (10 mg total) by mouth every 6 (six) hours as needed for up to 15 days (pain).    Dispense:  60 tablet    Refill:  0    Order Specific Question:   Supervising Provider    Answer:   Quentin Angst [[id number]]   [name]  APRN, MSN, FNP-C Patient Care [facility] [address] [place], [place] [address] [phone]

## 2022-05-22 NOTE — Telephone Encounter (Signed)
Please advise Kh 

## 2022-05-23 ENCOUNTER — Other Ambulatory Visit (HOSPITAL_COMMUNITY): Payer: Self-pay

## 2022-05-30 ENCOUNTER — Non-Acute Institutional Stay (HOSPITAL_COMMUNITY)
Admission: RE | Admit: 2022-05-30 | Discharge: 2022-05-30 | Disposition: A | Discharge status: Home / Self Care | Payer: Medicare Other | Source: Ambulatory Visit | Attending: Internal Medicine | Admitting: Internal Medicine

## 2022-05-30 DIAGNOSIS — D5700 Hb-SS disease with crisis, unspecified: Secondary | ICD-10-CM | POA: Insufficient documentation

## 2022-05-30 LAB — CBC WITH DIFFERENTIAL/PLATELET
Abs Immature Granulocytes: 0.06 10*3/uL (ref 0.00–0.07)
Basophils Absolute: 0 10*3/uL (ref 0.0–0.1)
Basophils Relative: 0 %
Eosinophils Absolute: 0.2 10*3/uL (ref 0.0–0.5)
Eosinophils Relative: 2 %
HCT: 24.5 % — ABNORMAL LOW (ref 39.0–52.0)
Hemoglobin: 7.9 g/dL — ABNORMAL LOW (ref 13.0–17.0)
Immature Granulocytes: 1 %
Lymphocytes Relative: 46 %
Lymphs Abs: 4 10*3/uL (ref 0.7–4.0)
MCH: 42.9 pg — ABNORMAL HIGH (ref 26.0–34.0)
MCHC: 32.2 g/dL (ref 30.0–36.0)
MCV: 133.2 fL — ABNORMAL HIGH (ref 80.0–100.0)
Monocytes Absolute: 0.7 10*3/uL (ref 0.1–1.0)
Monocytes Relative: 8 %
Neutro Abs: 3.7 10*3/uL (ref 1.7–7.7)
Neutrophils Relative %: 43 %
Platelets: 160 10*3/uL (ref 150–400)
RBC: 1.84 MIL/uL — ABNORMAL LOW (ref 4.22–5.81)
RDW: 20.9 % — ABNORMAL HIGH (ref 11.5–15.5)
WBC: 8.6 10*3/uL (ref 4.0–10.5)
nRBC: 0.7 % — ABNORMAL HIGH (ref 0.0–0.2)

## 2022-05-30 LAB — COMPREHENSIVE METABOLIC PANEL
ALT: 54 U/L — ABNORMAL HIGH (ref 0–44)
AST: 52 U/L — ABNORMAL HIGH (ref 15–41)
Albumin: 3.3 g/dL — ABNORMAL LOW (ref 3.5–5.0)
Alkaline Phosphatase: 156 U/L — ABNORMAL HIGH (ref 38–126)
Anion gap: 8 (ref 5–15)
BUN: 56 mg/dL — ABNORMAL HIGH (ref 6–20)
CO2: 20 mmol/L — ABNORMAL LOW (ref 22–32)
Calcium: 8.4 mg/dL — ABNORMAL LOW (ref 8.9–10.3)
Chloride: 106 mmol/L (ref 98–111)
Creatinine, Ser: 3.49 mg/dL — ABNORMAL HIGH (ref 0.61–1.24)
GFR, Estimated: 22 mL/min — ABNORMAL LOW (ref >60)
Glucose, Bld: 96 mg/dL (ref 70–99)
Potassium: 4.6 mmol/L (ref 3.5–5.1)
Sodium: 134 mmol/L — ABNORMAL LOW (ref 135–145)
Total Bilirubin: 1.2 mg/dL (ref 0.3–1.2)
Total Protein: 7 g/dL (ref 6.5–8.1)

## 2022-05-30 NOTE — Progress Notes (Signed)
Patient arrived for lab draw. Labs (CBC w/ diff, CMP, Retic) drawn peripherally by phlebotomist. Patient tolerated well. Patient alert, oriented and ambulatory at discharge.

## 2022-06-05 ENCOUNTER — Other Ambulatory Visit: Payer: Self-pay | Admitting: Family Medicine

## 2022-06-05 ENCOUNTER — Other Ambulatory Visit (HOSPITAL_COMMUNITY): Payer: Self-pay

## 2022-06-05 DIAGNOSIS — D571 Sickle-cell disease without crisis: Secondary | ICD-10-CM

## 2022-06-05 DIAGNOSIS — G894 Chronic pain syndrome: Secondary | ICD-10-CM

## 2022-06-05 MED ORDER — OXYCODONE HCL 10 MG PO TABS
10.0000 mg | ORAL_TABLET | Freq: Four times a day (QID) | ORAL | 0 refills | Status: DC | PRN
  Filled 2022-06-07: qty 60, 15d supply, fill #0

## 2022-06-05 NOTE — Progress Notes (Signed)
Reviewed PDMP substance reporting system prior to prescribing opiate medications. No inconsistencies noted.  Meds ordered this encounter  Medications   Oxycodone HCl 10 MG TABS    Sig: Take 1 tablet (10 mg) by mouth every 6 hours as needed for pain.    Dispense:  60 tablet    Refill:  0    Order Specific Question:   Supervising Provider    Answer:   Quentin Angst [[id number]]   [name]  APRN, MSN, FNP-C Patient Care [facility] [address] [place], [place] [address] [phone]

## 2022-06-05 NOTE — Telephone Encounter (Signed)
Please advise K[facility] 

## 2022-06-06 ENCOUNTER — Other Ambulatory Visit (HOSPITAL_COMMUNITY): Payer: Self-pay

## 2022-06-07 ENCOUNTER — Other Ambulatory Visit (HOSPITAL_COMMUNITY): Payer: Self-pay

## 2022-06-19 ENCOUNTER — Other Ambulatory Visit: Payer: Self-pay | Admitting: Family Medicine

## 2022-06-19 ENCOUNTER — Other Ambulatory Visit (HOSPITAL_COMMUNITY): Payer: Self-pay

## 2022-06-19 ENCOUNTER — Telehealth: Payer: Self-pay | Admitting: Family Medicine

## 2022-06-19 DIAGNOSIS — G894 Chronic pain syndrome: Secondary | ICD-10-CM

## 2022-06-19 DIAGNOSIS — D571 Sickle-cell disease without crisis: Secondary | ICD-10-CM

## 2022-06-19 MED ORDER — OXYCODONE HCL ER 15 MG PO T12A
15.0000 mg | EXTENDED_RELEASE_TABLET | Freq: Two times a day (BID) | ORAL | 0 refills | Status: DC
Start: 2022-06-22 — End: 2022-07-17
  Filled 2022-06-22: qty 60, 30d supply, fill #0

## 2022-06-19 MED ORDER — OXYCODONE HCL 10 MG PO TABS
10.0000 mg | ORAL_TABLET | Freq: Four times a day (QID) | ORAL | 0 refills | Status: DC | PRN
Start: 2022-06-22 — End: 2022-07-05
  Filled 2022-06-22: qty 60, 15d supply, fill #0

## 2022-06-19 NOTE — Progress Notes (Signed)
Reviewed PDMP substance reporting system prior to prescribing opiate medications. No inconsistencies noted.  Meds ordered this encounter  Medications   oxyCODONE (OXYCONTIN) 15 mg 12 hr tablet    Sig: Take 1 tablet (15 mg) by mouth every 12 hours.    Dispense:  60 tablet    Refill:  0    Order Specific Question:   Supervising Provider    Answer:   Quentin Angst [[id number]]   Oxycodone HCl 10 MG TABS    Sig: Take 1 tablet (10 mg) by mouth every 6 hours as needed for pain.    Dispense:  60 tablet    Refill:  0    Order Specific Question:   Supervising Provider    Answer:   Quentin Angst [[id number]]   [name]  APRN, MSN, FNP-C Patient Care [facility] [address] [place], [place] [address] [phone]

## 2022-06-19 NOTE — Telephone Encounter (Signed)
Please advise Kh 

## 2022-06-20 ENCOUNTER — Other Ambulatory Visit (HOSPITAL_COMMUNITY): Payer: Self-pay

## 2022-06-20 NOTE — Telephone Encounter (Signed)
Caller & Relationship to patient:  MRN #  [id number]   Call Back Number:   Date of Last Office Visit: 06/05/2022     Date of Next Office Visit: 07/25/2022    Medication(s) to be Refilled: Oxy Contin 10,15  Preferred Pharmacy:   ** Please notify patient to allow 48-72 hours to process** **Let patient know to contact pharmacy at the end of the day to make sure medication is ready. ** **If patient has not been seen in a year or longer, book an appointment **Advise to use MyChart for refill requests OR to contact their pharmacy

## 2022-06-22 ENCOUNTER — Other Ambulatory Visit (HOSPITAL_COMMUNITY): Payer: Self-pay

## 2022-06-27 ENCOUNTER — Encounter (HOSPITAL_COMMUNITY): Payer: Self-pay | Admitting: Family Medicine

## 2022-06-27 ENCOUNTER — Inpatient Hospital Stay (HOSPITAL_COMMUNITY)
Admission: AD | Admit: 2022-06-27 | Discharge: 2022-07-01 | DRG: 812 | Disposition: A | Discharge status: Home / Self Care | Payer: Medicare Other | Source: Ambulatory Visit | Attending: Internal Medicine | Admitting: Internal Medicine

## 2022-06-27 ENCOUNTER — Telehealth (HOSPITAL_COMMUNITY): Payer: Self-pay | Admitting: General Practice

## 2022-06-27 ENCOUNTER — Other Ambulatory Visit: Payer: Self-pay

## 2022-06-27 DIAGNOSIS — F17210 Nicotine dependence, cigarettes, uncomplicated: Secondary | ICD-10-CM | POA: Diagnosis present

## 2022-06-27 DIAGNOSIS — G894 Chronic pain syndrome: Secondary | ICD-10-CM | POA: Diagnosis present

## 2022-06-27 DIAGNOSIS — Z79899 Other long term (current) drug therapy: Secondary | ICD-10-CM | POA: Diagnosis not present

## 2022-06-27 DIAGNOSIS — Z9049 Acquired absence of other specified parts of digestive tract: Secondary | ICD-10-CM

## 2022-06-27 DIAGNOSIS — D638 Anemia in other chronic diseases classified elsewhere: Secondary | ICD-10-CM | POA: Diagnosis present

## 2022-06-27 DIAGNOSIS — F1290 Cannabis use, unspecified, uncomplicated: Secondary | ICD-10-CM | POA: Diagnosis present

## 2022-06-27 DIAGNOSIS — Z9081 Acquired absence of spleen: Secondary | ICD-10-CM

## 2022-06-27 DIAGNOSIS — Z1152 Encounter for screening for COVID-19: Secondary | ICD-10-CM

## 2022-06-27 DIAGNOSIS — K219 Gastro-esophageal reflux disease without esophagitis: Secondary | ICD-10-CM | POA: Diagnosis present

## 2022-06-27 DIAGNOSIS — E875 Hyperkalemia: Secondary | ICD-10-CM | POA: Diagnosis present

## 2022-06-27 DIAGNOSIS — Z803 Family history of malignant neoplasm of breast: Secondary | ICD-10-CM | POA: Diagnosis not present

## 2022-06-27 DIAGNOSIS — F1210 Cannabis abuse, uncomplicated: Secondary | ICD-10-CM | POA: Diagnosis present

## 2022-06-27 DIAGNOSIS — R011 Cardiac murmur, unspecified: Secondary | ICD-10-CM | POA: Diagnosis present

## 2022-06-27 DIAGNOSIS — D5700 Hb-SS disease with crisis, unspecified: Principal | ICD-10-CM | POA: Diagnosis present

## 2022-06-27 DIAGNOSIS — N184 Chronic kidney disease, stage 4 (severe): Secondary | ICD-10-CM | POA: Diagnosis present

## 2022-06-27 LAB — CBC WITH DIFFERENTIAL/PLATELET
Abs Immature Granulocytes: 0.07 10*3/uL (ref 0.00–0.07)
Basophils Absolute: 0 10*3/uL (ref 0.0–0.1)
Basophils Relative: 0 %
Eosinophils Absolute: 0.2 10*3/uL (ref 0.0–0.5)
Eosinophils Relative: 1 %
HCT: 22.3 % — ABNORMAL LOW (ref 39.0–52.0)
Hemoglobin: 8.1 g/dL — ABNORMAL LOW (ref 13.0–17.0)
Immature Granulocytes: 0 %
Lymphocytes Relative: 48 %
Lymphs Abs: 7.4 10*3/uL — ABNORMAL HIGH (ref 0.7–4.0)
MCH: 42.6 pg — ABNORMAL HIGH (ref 26.0–34.0)
MCHC: 36.3 g/dL — ABNORMAL HIGH (ref 30.0–36.0)
MCV: 117.4 fL — ABNORMAL HIGH (ref 80.0–100.0)
Monocytes Absolute: 2.1 10*3/uL — ABNORMAL HIGH (ref 0.1–1.0)
Monocytes Relative: 13 %
Neutro Abs: 6.1 10*3/uL (ref 1.7–7.7)
Neutrophils Relative %: 38 %
Platelets: 164 10*3/uL (ref 150–400)
RBC: 1.9 MIL/uL — ABNORMAL LOW (ref 4.22–5.81)
RDW: 17.5 % — ABNORMAL HIGH (ref 11.5–15.5)
WBC: 15.8 10*3/uL — ABNORMAL HIGH (ref 4.0–10.5)
nRBC: 0.2 % (ref 0.0–0.2)

## 2022-06-27 LAB — RETICULOCYTES
Immature Retic Fract: 32.1 % — ABNORMAL HIGH (ref 2.3–15.9)
RBC.: 1.9 MIL/uL — ABNORMAL LOW (ref 4.22–5.81)
Retic Count, Absolute: 86.5 10*3/uL (ref 19.0–186.0)
Retic Ct Pct: 4.6 % — ABNORMAL HIGH (ref 0.4–3.1)

## 2022-06-27 LAB — COMPREHENSIVE METABOLIC PANEL
ALT: 66 U/L — ABNORMAL HIGH (ref 0–44)
AST: 83 U/L — ABNORMAL HIGH (ref 15–41)
Albumin: 3.5 g/dL (ref 3.5–5.0)
Alkaline Phosphatase: 167 U/L — ABNORMAL HIGH (ref 38–126)
Anion gap: 12 (ref 5–15)
BUN: 63 mg/dL — ABNORMAL HIGH (ref 6–20)
CO2: 18 mmol/L — ABNORMAL LOW (ref 22–32)
Calcium: 8.6 mg/dL — ABNORMAL LOW (ref 8.9–10.3)
Chloride: 104 mmol/L (ref 98–111)
Creatinine, Ser: 4.15 mg/dL — ABNORMAL HIGH (ref 0.61–1.24)
GFR, Estimated: 18 mL/min — ABNORMAL LOW (ref >60)
Glucose, Bld: 105 mg/dL — ABNORMAL HIGH (ref 70–99)
Potassium: 4.9 mmol/L (ref 3.5–5.1)
Sodium: 134 mmol/L — ABNORMAL LOW (ref 135–145)
Total Bilirubin: 1.5 mg/dL — ABNORMAL HIGH (ref 0.3–1.2)
Total Protein: 7.7 g/dL (ref 6.5–8.1)

## 2022-06-27 LAB — LACTATE DEHYDROGENASE: LDH: 216 U/L — ABNORMAL HIGH (ref 98–192)

## 2022-06-27 MED ORDER — DIPHENHYDRAMINE HCL 25 MG PO CAPS: 25.0000 mg | ORAL_CAPSULE | ORAL | Status: DC | PRN

## 2022-06-27 MED ORDER — FOLIC ACID 1 MG PO TABS
1.0000 mg | ORAL_TABLET | Freq: Every day | ORAL | Status: DC
  Administered 2022-06-28 – 2022-07-01 (×4): 1 mg via ORAL
  Filled 2022-06-27 (×4): qty 1

## 2022-06-27 MED ORDER — HYDROMORPHONE 1 MG/ML IV SOLN
INTRAVENOUS | Status: DC
  Administered 2022-06-27: 4.5 mg via INTRAVENOUS
  Administered 2022-06-27 (×2): 1 mg via INTRAVENOUS
  Administered 2022-06-27: 30 mg via INTRAVENOUS
  Administered 2022-06-28: 1 mg via INTRAVENOUS
  Administered 2022-06-28: 1.5 mg via INTRAVENOUS
  Administered 2022-06-28: 2.5 mg via INTRAVENOUS
  Filled 2022-06-27: qty 30

## 2022-06-27 MED ORDER — SODIUM BICARBONATE 650 MG PO TABS
1950.0000 mg | ORAL_TABLET | Freq: Two times a day (BID) | ORAL | Status: DC
  Administered 2022-06-27 – 2022-07-01 (×8): 1950 mg via ORAL
  Filled 2022-06-27 (×8): qty 3

## 2022-06-27 MED ORDER — ACETAMINOPHEN 500 MG PO TABS
1000.0000 mg | ORAL_TABLET | Freq: Once | ORAL | Status: AC
  Administered 2022-06-27: 1000 mg via ORAL
  Filled 2022-06-27: qty 2

## 2022-06-27 MED ORDER — ONDANSETRON HCL 4 MG/2ML IJ SOLN
4.0000 mg | Freq: Four times a day (QID) | INTRAMUSCULAR | Status: DC | PRN
  Administered 2022-06-27: 4 mg via INTRAVENOUS
  Filled 2022-06-27: qty 2

## 2022-06-27 MED ORDER — HYDROXYUREA 500 MG PO CAPS
500.0000 mg | ORAL_CAPSULE | ORAL | Status: DC
  Administered 2022-06-28 – 2022-07-01 (×3): 500 mg via ORAL
  Filled 2022-06-27 (×4): qty 1

## 2022-06-27 MED ORDER — SENNOSIDES-DOCUSATE SODIUM 8.6-50 MG PO TABS
1.0000 | ORAL_TABLET | Freq: Two times a day (BID) | ORAL | Status: DC
  Filled 2022-06-27 (×6): qty 1

## 2022-06-27 MED ORDER — SODIUM CHLORIDE 0.9% FLUSH: 9.0000 mL | INTRAVENOUS | Status: DC | PRN

## 2022-06-27 MED ORDER — DEFERIPRONE (TWICE DAILY) 1000 MG PO TABS: 3000.0000 mg | ORAL_TABLET | Freq: Every day | ORAL | Status: DC

## 2022-06-27 MED ORDER — PATIROMER SORBITEX CALCIUM 8.4 G PO PACK
16.8000 g | PACK | Freq: Every day | ORAL | Status: DC
  Administered 2022-06-28 – 2022-06-30 (×3): 16.8 g via ORAL
  Filled 2022-06-27 (×4): qty 2

## 2022-06-27 MED ORDER — ENOXAPARIN SODIUM 40 MG/0.4ML IJ SOSY: 40.0000 mg | PREFILLED_SYRINGE | INTRAMUSCULAR | Status: DC

## 2022-06-27 MED ORDER — POLYETHYLENE GLYCOL 3350 17 G PO PACK: 17.0000 g | PACK | Freq: Every day | ORAL | Status: DC | PRN

## 2022-06-27 MED ORDER — HYDROXYUREA 500 MG PO CAPS
1000.0000 mg | ORAL_CAPSULE | ORAL | Status: DC
  Administered 2022-06-30: 1000 mg via ORAL
  Filled 2022-06-27: qty 2

## 2022-06-27 MED ORDER — AMITRIPTYLINE HCL 25 MG PO TABS
50.0000 mg | ORAL_TABLET | Freq: Every day | ORAL | Status: DC
  Administered 2022-06-27 – 2022-06-30 (×4): 50 mg via ORAL
  Filled 2022-06-27 (×4): qty 2

## 2022-06-27 MED ORDER — HYDROMORPHONE HCL 1 MG/ML IJ SOLN
1.0000 mg | Freq: Once | INTRAMUSCULAR | Status: AC
  Administered 2022-06-27: 1 mg via INTRAVENOUS
  Filled 2022-06-27: qty 1

## 2022-06-27 MED ORDER — OXYCODONE HCL ER 15 MG PO T12A
15.0000 mg | EXTENDED_RELEASE_TABLET | Freq: Two times a day (BID) | ORAL | Status: DC
  Administered 2022-06-27 – 2022-07-01 (×8): 15 mg via ORAL
  Filled 2022-06-27 (×8): qty 1

## 2022-06-27 MED ORDER — NALOXONE HCL 0.4 MG/ML IJ SOLN: 0.4000 mg | INTRAMUSCULAR | Status: DC | PRN

## 2022-06-27 MED ORDER — VOXELOTOR 500 MG PO TABS: 1500.0000 mg | ORAL_TABLET | Freq: Every day | ORAL | Status: DC

## 2022-06-27 MED ORDER — CALCITRIOL 0.5 MCG PO CAPS
0.5000 ug | ORAL_CAPSULE | Freq: Every day | ORAL | Status: DC
  Administered 2022-06-28 – 2022-07-01 (×4): 0.5 ug via ORAL
  Filled 2022-06-27 (×4): qty 1

## 2022-06-27 MED ORDER — HEPARIN SODIUM (PORCINE) 5000 UNIT/ML IJ SOLN
5000.0000 [IU] | Freq: Three times a day (TID) | INTRAMUSCULAR | Status: DC
  Filled 2022-06-27: qty 1

## 2022-06-27 MED ORDER — SODIUM CHLORIDE 0.45 % IV SOLN: INTRAVENOUS | Status: DC

## 2022-06-27 NOTE — Telephone Encounter (Signed)
Patient called, requesting to come to the day hospital due to pain in the legs and feet  rated at 10/10. Denied chest pain, fever, diarrhea, abdominal pain, nausea/vomiting and priapism. Screened negative for Covid-19 symptoms. Admitted to having means of transportation without driving self after treatment. Last took 10 mg of oxycontin and 15 mg of oxycodone at 06:30 am today. Per provider, patient can come to the day hospital for treatment. Patient notified, verbalized understanding.

## 2022-06-27 NOTE — Progress Notes (Signed)
Patient admitted to the day infusion hospital for sickle cell pain. Initially, patient reported right foot pain rated 10/10. For pain management, patient placed on Sickle Cell Dose Dilaudid PCA, given 1000 mg Tylenol and hydrated with IV fluids. Patient's pain remained elevated at 9/10. Provider assessed patient and placed order for additional dose of Dilaudid 1 mg IV push and orders for admission to inpatient unit for continued pain control. Report called to Ellan Lambert, RN on [address]. Patient transferred to [address] in wheelchair on PCA (settings 0.5/10/3 verified with Louie Bun, RN prior to transfer).  Vital signs wnl. Patient alert, oriented and stable at transfer.

## 2022-06-27 NOTE — H&P (Signed)
[facility] History and Physical   Date: 06/27/2022  Patient name: [name] [name]. Medical record number: [id number] Date of birth: 24-Jul-1980 Age: 42 y.o. Gender: male PCP: Massie Maroon, FNP  Attending physician: Quentin Angst, MD  Chief Complaint: Sickle cell pain   History of Present Illness: [name] [name] is a 42 year old male with a medical history significant for sickle cell disease, stage IV chronic kidney disease, opiate dependence and tolerance, anemia of chronic disease, and chronic marijuana use presents to sickle cell day infusion clinic with bilateral foot pain that is consistent with previous pain crisis.  Patient complaining of bilateral foot pain, especially to right that is worsened with weightbearing patient states that pain intensity increased on yesterday and has been unrelieved by his home medications.  He has not identified any inciting factors concerning crisis.  He rates his pain as 10/10, constant, and throbbing.  He says that he has not been able to get any relief from his home medications that were last taken this a.m.  He denies any fever, chills, chest pain, dizziness, or shortness of breath.  No urinary symptoms, nausea, vomiting, or diarrhea.  Sickle cell day infusion center course: Patient's vital signs have remained stable throughout admission and are recorded as: BP 113/76 (BP Location: Right Arm)   Pulse 75   Temp 98 F (36.7 C) (Oral)   Resp 18   Ht 5\' 3"  (1.6 m)   Wt 54 kg   SpO2 95%   BMI 21.08 kg/m  Complete metabolic panel shows sodium 134, creatinine 4.15, alkaline phosphatase 167, AST 83, ALT 66, total bilirubin 1.5, and GFR 18.  LDH elevated at 216.  WBCs 15.8, hemoglobin 8.1, and platelets 164,000.  Reticulocyte percentage 4.6.  Patient's pain persists despite IV Dilaudid PCA and IV fluids.  Patient will be admitted for further management of sickle cell pain crisis.  Meds: Medications Prior to Admission   Medication Sig Dispense Refill Last Dose   amitriptyline (ELAVIL) 25 MG tablet Take 50 mg by mouth at bedtime.      calcitRIOL (ROCALTROL) 0.25 MCG capsule Take 0.5 mcg by mouth daily with breakfast.      Deferiprone, Twice Daily, (FERRIPROX TWICE-A-DAY) 1000 MG TABS Take 3,000 mg by mouth daily. 180 tablet 0    folic acid (FOLVITE) 1 MG tablet TAKE 1 TABLET BY MOUTH EVERY DAY (Patient taking differently: Take 1 mg by mouth every morning.) 90 tablet 4    hydroxyurea (HYDREA) 500 MG capsule Take 1 capsule by mouth in the morning on Sun/Tues/Wed/Thurs/Sat and 2 capsules on Mon/Fri 260 capsule 1    oxyCODONE (OXYCONTIN) 15 mg 12 hr tablet Take 1 tablet (15 mg) by mouth every 12 hours. 60 tablet 0    Oxycodone HCl 10 MG TABS Take 1 tablet (10 mg) by mouth every 6 hours as needed for pain. 60 tablet 0    sodium bicarbonate 650 MG tablet Take 1,950 mg by mouth 2 (two) times daily.      TYLENOL 8 HOUR ARTHRITIS PAIN 650 MG CR tablet Take 650 mg by mouth every 8 (eight) hours as needed for pain.      VELTASSA 16.8 g PACK Take 16.8 g by mouth daily.      voxelotor (OXBRYTA) 500 MG TABS tablet Take 1,500 mg by mouth daily with breakfast.       Allergies: Nsaids and Morphine and related Past Medical History:  Diagnosis Date   Abscess of right iliac fossa 09/24/2018  required Perc Drain 09/24/2018   Arachnoid Cyst of brain bilaterally    "2 really small ones in the back of my head; inside; saw them w/MRI" (09/25/2012)   Bacterial pneumonia ~ 2012   "caught it here in the hospital" (09/25/2012)   Chronic kidney disease    "from my sickle cell" (09/25/2012)   CKD (chronic kidney disease), stage II    Colitis 04/19/2017   CT scan abd/ pelvis   GERD (gastroesophageal reflux disease)    "after I eat alot of spicey foods" (09/25/2012)   Gynecomastia, male 07/10/2012   History of blood transfusion    "always related to sickle cell crisis" (09/25/2012)   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   Noted  in noted from Hematology notes at [facility]   Migraines    "take RX qd to prevent them" (09/25/2012)   Opioid dependence with withdrawal (HCC) 08/30/2012   Renal insufficiency    Sickle cell anemia (HCC)    Sickle cell crisis (HCC) 09/25/2012   Sickle cell nephropathy (HCC) 07/10/2012   Sinus tachycardia    Tachycardia with heart rate 121-140 beats per minute with ambulation 08/04/2016   Past Surgical History:  Procedure Laterality Date   ABCESS DRAINAGE     CHOLECYSTECTOMY  ~ 2012   COLONOSCOPY N/A 04/23/2017   Procedure: COLONOSCOPY;  Surgeon: Hilarie Fredrickson, MD;  Location: [facility] ENDOSCOPY;  Service: Endoscopy;  Laterality: N/A;   IR FLUORO GUIDE CV LINE RIGHT  12/17/2016   IR FLUORO GUIDE CV LINE RIGHT  02/07/2021   IR RADIOLOGIST EVAL & MGMT  10/02/2018   IR RADIOLOGIST EVAL & MGMT  10/15/2018   IR REMOVAL TUN CV CATH W/O FL  12/21/2016   IR REMOVAL TUN CV CATH W/O FL  02/11/2021   IR US GUIDE VASC ACCESS RIGHT  12/17/2016   IR US GUIDE VASC ACCESS RIGHT  02/07/2021   spleenectomy     Family History  Problem Relation Age of Onset   Breast cancer Mother    Social History   Socioeconomic History   Marital status: Single    Spouse name: Not on file   Number of children: Not on file   Years of education: Not on file   Highest education level: Not on file  Occupational History   Not on file  Tobacco Use   Smoking status: Some Days    Packs/day: .1    Types: Cigarettes   Smokeless tobacco: Never  Vaping Use   Vaping Use: Never used  Substance and Sexual Activity   Alcohol use: Yes    Comment: Once a month   Drug use: Yes    Types: Marijuana    Comment: Every 2-3 weeks    Sexual activity: Yes    Birth control/protection: Condom  Other Topics Concern   Not on file  Social History Narrative    Lives alone in [place].   Back at school, studying Patent attorney. Unemployed.    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    Social Determinants of Health   Financial Resource Strain: Low Risk  (02/09/2022)   Overall Financial Resource Strain (CARDIA)    Difficulty of Paying Living Expenses: Not hard at all  Food Insecurity: Food Insecurity Present (05/01/2022)   Hunger Vital Sign    Worried About Running Out of Food in the Last  Year: Sometimes true    Ran Out of Food in the Last Year: Sometimes true  Transportation Needs: No Transportation Needs (05/01/2022)   PRAPARE - Administrator, Civil Service (Medical): No    Lack of Transportation (Non-Medical): No  Physical Activity: Sufficiently Active (02/09/2022)   Exercise Vital Sign    Days of Exercise per Week: 7 days    Minutes of Exercise per Session: 30 min  Stress: No Stress Concern Present (02/09/2022)   [employer] of Occupational Health - Occupational Stress Questionnaire    Feeling of Stress : Not at all  Social Connections: Socially Isolated (02/09/2022)   Social Connection and Isolation Panel [NHANES]    Frequency of Communication with Friends and Family: More than three times a week    Frequency of Social Gatherings with Friends and Family: Once a week    Attends Religious Services: Never    Database administrator or Organizations: No    Attends Banker Meetings: Never    Marital Status: Divorced  Catering manager Violence: Not At Risk (05/01/2022)   Humiliation, Afraid, Rape, and Kick questionnaire    Fear of Current or Ex-Partner: No    Emotionally Abused: No    Physically Abused: No    Sexually Abused: No   Review of Systems  Constitutional:  Negative for chills and fever.  HENT: Negative.    Eyes: Negative.   Respiratory: Negative.    Cardiovascular: Negative.   Gastrointestinal: Negative.   Genitourinary: Negative.   Musculoskeletal:  Positive for back pain and joint pain.  Skin: Negative.    Neurological: Negative.   Endo/Heme/Allergies: Negative.   Psychiatric/Behavioral: Negative.      Physical Exam: Blood pressure 119/82, pulse 89, temperature 97.8 F (36.6 C), temperature source Temporal, resp. rate 16, SpO2 99 %. Physical Exam Constitutional:      Appearance: Normal appearance.  Eyes:     Pupils: Pupils are equal, round, and reactive to light.  Cardiovascular:     Rate and Rhythm: Normal rate and regular rhythm.     Heart sounds: Murmur heard.  Pulmonary:     Effort: Pulmonary effort is normal.  Abdominal:     General: Bowel sounds are normal.  Skin:    General: Skin is warm.  Neurological:     General: No focal deficit present.     Mental Status: He is alert. Mental status is at baseline.  Psychiatric:        Mood and Affect: Mood normal.        Thought Content: Thought content normal.        Judgment: Judgment normal.      Lab results: No results found. However, due to the size of the patient record, not all encounters were searched. Please check Results Review for a complete set of results.  Imaging results:  No results found.  Sickle cell disease with pain crisis: Admit.  Continue IV Dilaudid PCA without any changes in settings OxyContin 15 mg every 12 hours Hold oxycodone Hold IV Toradol due to chronic kidney disease stage IV. IV fluids, 0.45% saline at 100 mL/h. Monitor vital signs very closely, reevaluate pain scale regularly, and supplemental oxygen as needed. Patient will be reevaluated for pain in the context of function and relationship to baseline.  Chronic kidney disease stage IV: Creatinine elevated above baseline at 4.15, baseline is 3.5-3.6.  Patient is followed by nephrology as an outpatient.  Gentle hydration.  Avoid all nephrotoxins.  Anemia of  chronic disease: Patient's hemoglobin is 8.1 g/dL, consistent with his baseline.  There is no clinical indication for blood transfusion at this time.  Patient is typically not  transfused unless hemoglobin is less than 7.0 g/dL.  Follow labs in AM.  Leukocytosis: WBCs 15.8.  More than likely reactive.  No antibiotics at this time.  Follow closely.  Labs in AM.  Chronic pain syndrome: Continue home medications Marijuana abuse: Patient counseled at length on the dangers of smoking, especially in the setting of sickle cell disease.  VTE: Lovenox and SCDs   [name]  APRN, MSN, FNP-C Patient Care [facility] [address] [place], [place] [address] [phone]  06/27/2022, 9:21 AM

## 2022-06-28 DIAGNOSIS — D5700 Hb-SS disease with crisis, unspecified: Secondary | ICD-10-CM | POA: Diagnosis not present

## 2022-06-28 LAB — BASIC METABOLIC PANEL
Anion gap: 11 (ref 5–15)
BUN: 58 mg/dL — ABNORMAL HIGH (ref 6–20)
CO2: 18 mmol/L — ABNORMAL LOW (ref 22–32)
Calcium: 8.8 mg/dL — ABNORMAL LOW (ref 8.9–10.3)
Chloride: 106 mmol/L (ref 98–111)
Creatinine, Ser: 3.71 mg/dL — ABNORMAL HIGH (ref 0.61–1.24)
GFR, Estimated: 20 mL/min — ABNORMAL LOW (ref >60)
Glucose, Bld: 86 mg/dL (ref 70–99)
Potassium: 5.6 mmol/L — ABNORMAL HIGH (ref 3.5–5.1)
Sodium: 135 mmol/L (ref 135–145)

## 2022-06-28 LAB — CBC
HCT: 20.6 % — ABNORMAL LOW (ref 39.0–52.0)
Hemoglobin: 7.2 g/dL — ABNORMAL LOW (ref 13.0–17.0)
MCH: 41.6 pg — ABNORMAL HIGH (ref 26.0–34.0)
MCHC: 35 g/dL (ref 30.0–36.0)
MCV: 119.1 fL — ABNORMAL HIGH (ref 80.0–100.0)
Platelets: 134 10*3/uL — ABNORMAL LOW (ref 150–400)
RBC: 1.73 MIL/uL — ABNORMAL LOW (ref 4.22–5.81)
RDW: 19.3 % — ABNORMAL HIGH (ref 11.5–15.5)
WBC: 16.7 10*3/uL — ABNORMAL HIGH (ref 4.0–10.5)
nRBC: 0.1 % (ref 0.0–0.2)

## 2022-06-28 LAB — TYPE AND SCREEN: ABO/RH(D): O POS

## 2022-06-28 MED ORDER — ACETAMINOPHEN 325 MG PO TABS
650.0000 mg | ORAL_TABLET | Freq: Four times a day (QID) | ORAL | Status: DC | PRN
  Administered 2022-06-28: 650 mg via ORAL
  Filled 2022-06-28: qty 2

## 2022-06-28 MED ORDER — OXYCODONE HCL 5 MG PO TABS
10.0000 mg | ORAL_TABLET | ORAL | Status: DC | PRN
  Administered 2022-06-28: 10 mg via ORAL
  Filled 2022-06-28: qty 2

## 2022-06-28 MED ORDER — HYDROMORPHONE 1 MG/ML IV SOLN
INTRAVENOUS | Status: DC
  Administered 2022-06-28 (×2): 2 mg via INTRAVENOUS
  Administered 2022-06-28 – 2022-06-29 (×3): 1 mg via INTRAVENOUS
  Administered 2022-06-29: 30 mg via INTRAVENOUS
  Administered 2022-06-29: 2 mg via INTRAVENOUS
  Administered 2022-06-29: 0.5 mg via INTRAVENOUS
  Administered 2022-06-29: 2.5 mg via INTRAVENOUS
  Administered 2022-06-30 (×2): 0.01 mg via INTRAVENOUS
  Administered 2022-06-30: 1 mg via INTRAVENOUS
  Administered 2022-07-01: 2.5 mg via INTRAVENOUS
  Administered 2022-07-01: 3.5 mg via INTRAVENOUS
  Filled 2022-06-28: qty 30
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
## 2022-06-28 NOTE — Progress Notes (Signed)
Subjective: [name] [name] is a 42 year old male with a medical history significant for sickle cell disease, chronic pain syndrome, opiate dependence and tolerance, and history of anemia of chronic disease, stage IV chronic kidney disease, anemia of chronic disease, and marijuana abuse was admitted for sickle cell pain crisis. Patient continues to have pain primarily to right lower extremity, especially right foot.  Pain is intensified by bearing weight.  He rates pain as 8/10, constant, and throbbing. Denies headache, blurry vision, dizziness, urinary symptoms, nausea, vomiting, or diarrhea.  Objective:  Vital signs in last 24 hours:  Vitals:   06/28/22 0428 06/28/22 0528 06/28/22 0753 06/28/22 0938  BP:  102/63  116/82  Pulse:  91  99  Resp: (!) 9  16 17   Temp:    98.6 F (37 C)  TempSrc:    Oral  SpO2: 95%  95% 99%  Weight:      Height:        Intake/Output from previous day:   Intake/Output Summary (Last 24 hours) at 06/28/2022 1140 Last data filed at 06/28/2022 0942 Gross per 24 hour  Intake 1946.94 ml  Output 625 ml  Net 1321.94 ml    Physical Exam: General: Alert, awake, oriented x3, in no acute distress.  HEENT: [place]/AT PEERL, EOMI Neck: Trachea midline,  no masses, no thyromegal,y no JVD, no carotid bruit OROPHARYNX:  Moist, No exudate/ erythema/lesions.  Heart: Regular rate and rhythm, without murmurs, rubs, gallops, PMI non-displaced, no heaves or thrills on palpation.  Lungs: Clear to auscultation, no wheezing or rhonchi noted. No increased vocal fremitus resonant to percussion  Abdomen: Soft, nontender, nondistended, positive bowel sounds, no masses no hepatosplenomegaly noted..  Neuro: No focal neurological deficits noted cranial nerves II through XII grossly intact. DTRs 2+ bilaterally upper and lower extremities. Strength 5 out of 5 in bilateral upper and lower extremities. Musculoskeletal: No warm swelling or erythema around joints, no spinal tenderness  noted. Psychiatric: Patient alert and oriented x3, good insight and cognition, good recent to remote recall. Lymph node survey: No cervical axillary or inguinal lymphadenopathy noted.  Lab Results:  Basic Metabolic Panel:    Component Value Date/Time   NA 135 06/28/2022 0617   NA 137 01/24/2022 1224   K 5.6 (H) 06/28/2022 0617   CL 106 06/28/2022 0617   CO2 18 (L) 06/28/2022 0617   BUN 58 (H) 06/28/2022 0617   BUN 35 (H) 01/24/2022 1224   CREATININE 3.71 (H) 06/28/2022 0617   CREATININE 1.45 (H) 05/12/2014 1430   GLUCOSE 86 06/28/2022 0617   CALCIUM 8.8 (L) 06/28/2022 0617   CBC:    Component Value Date/Time   WBC 16.7 (H) 06/28/2022 0617   HGB 7.2 (L) 06/28/2022 0617   HGB 9.2 (L) 10/25/2021 1125   HCT 20.6 (L) 06/28/2022 0617   HCT 25.1 (L) 10/25/2021 1125   PLT 134 (L) 06/28/2022 0617   PLT 194 10/25/2021 1125   MCV 119.1 (H) 06/28/2022 0617   MCV 107 (H) 10/25/2021 1125   NEUTROABS 6.1 06/27/2022 0920   NEUTROABS 6.6 10/25/2021 1125   LYMPHSABS 7.4 (H) 06/27/2022 0920   LYMPHSABS 5.7 (H) 10/25/2021 1125   MONOABS 2.1 (H) 06/27/2022 0920   EOSABS 0.2 06/27/2022 0920   EOSABS 0.4 10/25/2021 1125   BASOSABS 0.0 06/27/2022 0920   BASOSABS 0.1 10/25/2021 1125    No results found for this or any previous visit (from the past 240 hour(s)).  Studies/Results: No results found.  Medications: Scheduled Meds:  amitriptyline  50 mg Oral QHS   calcitRIOL  0.5 mcg Oral Q breakfast   Deferiprone (Twice Daily)  3,000 mg Oral Daily   folic acid  1 mg Oral Daily   heparin injection (subcutaneous)  5,000 Units Subcutaneous Q8H   HYDROmorphone   Intravenous Q4H   [START ON 06/30/2022] hydroxyurea  1,000 mg Oral Once per day on Mon Fri   hydroxyurea  500 mg Oral Once per day on Sun Tue Wed Thu Sat   oxyCODONE  15 mg Oral Q12H   patiromer  16.8 g Oral Daily   senna-docusate  1 tablet Oral BID   sodium bicarbonate  1,950 mg Oral BID   voxelotor  1,500 mg Oral Q breakfast    Continuous Infusions:  sodium chloride 100 mL/hr at 06/28/22 0605   PRN Meds:.diphenhydrAMINE, naloxone **AND** sodium chloride flush, ondansetron (ZOFRAN) IV, oxyCODONE, polyethylene glycol  Consultants: none  Procedures: none  Antibiotics: none  Assessment/Plan: Principal Problem:   Sickle cell pain crisis (HCC) Active Problems:   CKD (chronic kidney disease), stage IV (HCC)   Systolic murmur   Marijuana use  Right foot pain: Patient states that pain intensity has been worsening to right foot with weightbearing.  No erythema or edema noted on exam.  Will consider imaging if pain intensity worsens overnight.  Continue pain management.  Sickle cell disease with pain crisis: Continue IV Dilaudid PCA without changes Decrease IV fluids, 0.45% saline at 50 mL/h OxyContin 15 mg every 12 hours Will hold oxycodone Monitor vital signs very closely, reevaluate pain scale regularly, and supplemental oxygen as needed  Stage IV chronic kidney disease: On admission, creatinine was elevated above baseline at 4.15.  Creatinine trending down following gentle hydration, will continue.  Avoid all nephrotoxins.  Follow labs in AM.  Anemia of chronic disease: Hemoglobin slightly below baseline.  Possible hemodilution, will decrease IV fluids.  Reassess in AM.  Patient is typically not transfused unless hemoglobin is less than 7.0 g/dL. Defer to sickle cell team for any nonemergent blood transfusions  Marijuana abuse: On admission, patient counseled on the dangers of smoking especially in the setting of sickle cell disease.  Expressed understanding.  Code Status: Full Code Family Communication: N/A Disposition Plan: Not yet ready for discharge  Julianne Handler  If 7PM-7AM, please contact night-coverage.  06/28/2022, 11:40 AM  LOS: 1 day

## 2022-06-29 ENCOUNTER — Inpatient Hospital Stay (HOSPITAL_COMMUNITY): Payer: Medicare Other

## 2022-06-29 LAB — BASIC METABOLIC PANEL
Anion gap: 10 (ref 5–15)
BUN: 57 mg/dL — ABNORMAL HIGH (ref 6–20)
CO2: 15 mmol/L — ABNORMAL LOW (ref 22–32)
Calcium: 8.9 mg/dL (ref 8.9–10.3)
Chloride: 108 mmol/L (ref 98–111)
Creatinine, Ser: 3.65 mg/dL — ABNORMAL HIGH (ref 0.61–1.24)
GFR, Estimated: 21 mL/min — ABNORMAL LOW (ref >60)
Glucose, Bld: 98 mg/dL (ref 70–99)
Potassium: 5.4 mmol/L — ABNORMAL HIGH (ref 3.5–5.1)
Sodium: 133 mmol/L — ABNORMAL LOW (ref 135–145)

## 2022-06-29 LAB — CBC
HCT: 21.3 % — ABNORMAL LOW (ref 39.0–52.0)
Hemoglobin: 7.5 g/dL — ABNORMAL LOW (ref 13.0–17.0)
MCH: 41.9 pg — ABNORMAL HIGH (ref 26.0–34.0)
MCHC: 35.2 g/dL (ref 30.0–36.0)
MCV: 119 fL — ABNORMAL HIGH (ref 80.0–100.0)
Platelets: 155 10*3/uL (ref 150–400)
RBC: 1.79 MIL/uL — ABNORMAL LOW (ref 4.22–5.81)
RDW: 19.9 % — ABNORMAL HIGH (ref 11.5–15.5)
WBC: 15.7 10*3/uL — ABNORMAL HIGH (ref 4.0–10.5)
nRBC: 0.1 % (ref 0.0–0.2)

## 2022-06-29 MED ORDER — METHYLPREDNISOLONE SODIUM SUCC 40 MG IJ SOLR
40.0000 mg | Freq: Once | INTRAMUSCULAR | Status: AC
  Administered 2022-06-29: 40 mg via INTRAVENOUS
  Filled 2022-06-29: qty 1

## 2022-06-29 NOTE — Progress Notes (Signed)
Subjective: [name] [name] is a 42 year old male with a medical history significant for sickle cell disease, chronic pain syndrome, opiate dependence and tolerance, and history of anemia of chronic disease, stage IV chronic kidney disease, anemia of chronic disease, and marijuana abuse was admitted for sickle cell pain crisis. Patient does not have any new complaints on today. Patient continues to have pain primarily to right lower extremity, especially right foot.  Pain is intensified by bearing weight.  He rates pain as 8/10, constant, and throbbing. Denies headache, blurry vision, dizziness, urinary symptoms, nausea, vomiting, or diarrhea.  Objective:  Vital signs in last 24 hours:  Vitals:   06/29/22 0414 06/29/22 0836 06/29/22 0937 06/29/22 1211  BP:   95/60   Pulse:   90   Resp: 16 12 18 14   Temp:   98.2 F (36.8 C)   TempSrc:   Oral   SpO2: 95% 94% 96% 95%  Weight:      Height:        Intake/Output from previous day:   Intake/Output Summary (Last 24 hours) at 06/29/2022 1512 Last data filed at 06/29/2022 0522 Gross per 24 hour  Intake 880.89 ml  Output 1300 ml  Net -419.11 ml    Physical Exam: General: Alert, awake, oriented x3, in no acute distress.  HEENT: /AT PEERL, EOMI Neck: Trachea midline,  no masses, no thyromegal,y no JVD, no carotid bruit OROPHARYNX:  Moist, No exudate/ erythema/lesions.  Heart: Regular rate and rhythm, without murmurs, rubs, gallops, PMI non-displaced, no heaves or thrills on palpation.  Lungs: Clear to auscultation, no wheezing or rhonchi noted. No increased vocal fremitus resonant to percussion  Abdomen: Soft, nontender, nondistended, positive bowel sounds, no masses no hepatosplenomegaly noted..  Neuro: No focal neurological deficits noted cranial nerves II through XII grossly intact. DTRs 2+ bilaterally upper and lower extremities. Strength 5 out of 5 in bilateral upper and lower extremities. Musculoskeletal: No warm swelling or  erythema around joints, no spinal tenderness noted. Psychiatric: Patient alert and oriented x3, good insight and cognition, good recent to remote recall. Lymph node survey: No cervical axillary or inguinal lymphadenopathy noted.  Lab Results:  Basic Metabolic Panel:    Component Value Date/Time   NA 133 (L) 06/29/2022 1135   NA 137 01/24/2022 1224   K 5.4 (H) 06/29/2022 1135   CL 108 06/29/2022 1135   CO2 15 (L) 06/29/2022 1135   BUN 57 (H) 06/29/2022 1135   BUN 35 (H) 01/24/2022 1224   CREATININE 3.65 (H) 06/29/2022 1135   CREATININE 1.45 (H) 05/12/2014 1430   GLUCOSE 98 06/29/2022 1135   CALCIUM 8.9 06/29/2022 1135   CBC:    Component Value Date/Time   WBC 15.7 (H) 06/29/2022 1135   HGB 7.5 (L) 06/29/2022 1135   HGB 9.2 (L) 10/25/2021 1125   HCT 21.3 (L) 06/29/2022 1135   HCT 25.1 (L) 10/25/2021 1125   PLT 155 06/29/2022 1135   PLT 194 10/25/2021 1125   MCV 119.0 (H) 06/29/2022 1135   MCV 107 (H) 10/25/2021 1125   NEUTROABS 6.1 06/27/2022 0920   NEUTROABS 6.6 10/25/2021 1125   LYMPHSABS 7.4 (H) 06/27/2022 0920   LYMPHSABS 5.7 (H) 10/25/2021 1125   MONOABS 2.1 (H) 06/27/2022 0920   EOSABS 0.2 06/27/2022 0920   EOSABS 0.4 10/25/2021 1125   BASOSABS 0.0 06/27/2022 0920   BASOSABS 0.1 10/25/2021 1125    No results found for this or any previous visit (from the past 240 hour(s)).  Studies/Results: DG  Foot Complete Right  Result Date: 06/29/2022 CLINICAL DATA:  Right foot pain. History of sickle cell anemia. 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 EXAM: RIGHT FOOT COMPLETE - 3+ VIEW COMPARISON:  Right foot radiographs 11/19/2015; correlation with MRI left forefoot 02/24/2019 and left foot radiographs 07/17/2021 FINDINGS: There is patchy sclerosis and lucency throughout the distal first metatarsal from the proximal metaphysis through the distal articular surface. This is similar to the radiographic appearance of the contralateral left foot on 07/17/2021  radiographs, shown to represent multiple bone infarcts on 02/24/2019 left foot MRI. There is increased mild cortical collapse within the distal great toe metatarsal head compared to right foot radiographs 11/19/2015. Mild peripheral degenerative spurring. Compared to 11/19/2015, there is again patchy sclerosis and lucency throughout the navicular and minimal subchondral flattening/collapse at the distal medial and lateral aspects of the navicular at the articulations with the intermediate and lateral cuneiforms, respectively. There is also patchy sclerosis and lucency within the posterior aspect of the talus and the posterior aspect of the cuboid, likely mildly increased from 11/19/2015. IMPRESSION: 1. Patchy sclerosis and lucency within the distal first metatarsal, navicular, talus, and cuboid, consistent with known bone infarcts. 2. Increased mild distal subcortical collapse within the distal great toe metatarsal head compared to right foot radiographs 11/19/2015. Electronically Signed   By: Neita Garnet M.D.   On: 06/29/2022 11:47    Medications: Scheduled Meds:  amitriptyline  50 mg Oral QHS   calcitRIOL  0.5 mcg Oral Q breakfast   Deferiprone (Twice Daily)  3,000 mg Oral Daily   folic acid  1 mg Oral Daily   heparin injection (subcutaneous)  5,000 Units Subcutaneous Q8H   HYDROmorphone   Intravenous Q4H   [START ON 06/30/2022] hydroxyurea  1,000 mg Oral Once per day on Mon Fri   hydroxyurea  500 mg Oral Once per day on Sun Tue Wed Thu Sat   methylPREDNISolone (SOLU-MEDROL) injection  40 mg Intravenous Once   oxyCODONE  15 mg Oral Q12H   patiromer  16.8 g Oral Daily   senna-docusate  1 tablet Oral BID   sodium bicarbonate  1,950 mg Oral BID   voxelotor  1,500 mg Oral Q breakfast   Continuous Infusions:  sodium chloride 10 mL/hr at 06/29/22 1208   PRN Meds:.acetaminophen, diphenhydrAMINE, naloxone **AND** sodium chloride flush, ondansetron (ZOFRAN) IV, oxyCODONE, polyethylene  glycol  Consultants: none  Procedures: none  Antibiotics: none  Assessment/Plan: Principal Problem:   Sickle cell pain crisis (HCC) Active Problems:   CKD (chronic kidney disease), stage IV (HCC)   Systolic murmur   Marijuana use  Right foot pain: Patient states that pain intensity has been worsening to right foot with weightbearing.  Will review right foot x-ray as results become available.   Continue pain management.  Sickle cell disease with pain crisis: Continue IV Dilaudid PCA, decrease settings in preparation for discharge on 06/30/2022. Decrease IV fluids to [facility] OxyContin 15 mg every 12 hours Restart oxycodone 10 mg every 6 hours as needed for severe breakthrough pain. Monitor vital signs very closely, reevaluate pain scale regularly, and supplemental oxygen as needed  Stage IV chronic kidney disease: Stable and consistent with patient's baseline.  Continue to avoid all nephrotoxins.  Follow labs in AM.  Anemia of chronic disease: Stable.  No transfusion warranted at this time.  Patient is typically not transfused unless hemoglobin is less than 7.0 g/dL. Defer to sickle cell team for any nonemergent blood transfusions  Marijuana abuse:  On admission, patient counseled on the dangers of smoking especially in the setting of sickle cell disease.  Expressed understanding.  Code Status: Full Code Family Communication: N/A Disposition Plan: Not yet ready for discharge  Julianne Handler  If 7PM-7AM, please contact night-coverage.  06/29/2022, 3:12 PM  LOS: 2 days

## 2022-06-29 NOTE — Plan of Care (Addendum)
Pt a/ox4. Flat affect. Refused heparin inj, morning vital signs and labs this morning. Lab will come by later this morning to try again.   Problem: Self-Care: Goal: Ability to incorporate actions that prevent/reduce pain crisis will improve Outcome: Progressing   Problem: Clinical Measurements: Goal: Ability to maintain clinical measurements within normal limits will improve Outcome: Progressing Goal: Will remain free from infection Outcome: Progressing Goal: Diagnostic test results will improve Outcome: Progressing Goal: Respiratory complications will improve Outcome: Progressing Goal: Cardiovascular complication will be avoided Outcome: Progressing   Problem: Activity: Goal: Risk for activity intolerance will decrease Outcome: Progressing   Problem: Nutrition: Goal: Adequate nutrition will be maintained Outcome: Progressing   Problem: Coping: Goal: Level of anxiety will decrease Outcome: Progressing   Problem: Elimination: Goal: Will not experience complications related to bowel motility Outcome: Progressing Goal: Will not experience complications related to urinary retention Outcome: Progressing   Problem: Pain Managment: Goal: General experience of comfort will improve Outcome: Progressing   Problem: Safety: Goal: Ability to remain free from injury will improve Outcome: Progressing   Problem: Skin Integrity: Goal: Risk for impaired skin integrity will decrease Outcome: Progressing

## 2022-06-30 DIAGNOSIS — D5700 Hb-SS disease with crisis, unspecified: Secondary | ICD-10-CM | POA: Diagnosis not present

## 2022-06-30 LAB — CBC
HCT: 20.1 % — ABNORMAL LOW (ref 39.0–52.0)
Hemoglobin: 7 g/dL — ABNORMAL LOW (ref 13.0–17.0)
MCH: 41.4 pg — ABNORMAL HIGH (ref 26.0–34.0)
MCHC: 34.8 g/dL (ref 30.0–36.0)
MCV: 118.9 fL — ABNORMAL HIGH (ref 80.0–100.0)
Platelets: 134 10*3/uL — ABNORMAL LOW (ref 150–400)
RBC: 1.69 MIL/uL — ABNORMAL LOW (ref 4.22–5.81)
RDW: 20.8 % — ABNORMAL HIGH (ref 11.5–15.5)
WBC: 10.7 10*3/uL — ABNORMAL HIGH (ref 4.0–10.5)
nRBC: 0 % (ref 0.0–0.2)

## 2022-06-30 LAB — COMPREHENSIVE METABOLIC PANEL
ALT: 56 U/L — ABNORMAL HIGH (ref 0–44)
AST: 57 U/L — ABNORMAL HIGH (ref 15–41)
Albumin: 3.6 g/dL (ref 3.5–5.0)
Alkaline Phosphatase: 148 U/L — ABNORMAL HIGH (ref 38–126)
Anion gap: 11 (ref 5–15)
BUN: 74 mg/dL — ABNORMAL HIGH (ref 6–20)
CO2: 13 mmol/L — ABNORMAL LOW (ref 22–32)
Calcium: 8.8 mg/dL — ABNORMAL LOW (ref 8.9–10.3)
Chloride: 107 mmol/L (ref 98–111)
Creatinine, Ser: 3.98 mg/dL — ABNORMAL HIGH (ref 0.61–1.24)
GFR, Estimated: 19 mL/min — ABNORMAL LOW (ref >60)
Glucose, Bld: 112 mg/dL — ABNORMAL HIGH (ref 70–99)
Potassium: 6.6 mmol/L (ref 3.5–5.1)
Sodium: 131 mmol/L — ABNORMAL LOW (ref 135–145)
Total Bilirubin: 1.5 mg/dL — ABNORMAL HIGH (ref 0.3–1.2)
Total Protein: 7.6 g/dL (ref 6.5–8.1)

## 2022-06-30 LAB — BASIC METABOLIC PANEL
Anion gap: 11 (ref 5–15)
BUN: 80 mg/dL — ABNORMAL HIGH (ref 6–20)
CO2: 17 mmol/L — ABNORMAL LOW (ref 22–32)
Calcium: 9.5 mg/dL (ref 8.9–10.3)
Chloride: 106 mmol/L (ref 98–111)
Creatinine, Ser: 4.17 mg/dL — ABNORMAL HIGH (ref 0.61–1.24)
GFR, Estimated: 18 mL/min — ABNORMAL LOW (ref >60)
Glucose, Bld: 108 mg/dL — ABNORMAL HIGH (ref 70–99)
Potassium: 4.7 mmol/L (ref 3.5–5.1)
Sodium: 134 mmol/L — ABNORMAL LOW (ref 135–145)

## 2022-06-30 LAB — TYPE AND SCREEN
Donor AG Type: NEGATIVE
Unit division: 0

## 2022-06-30 LAB — PREPARE RBC (CROSSMATCH)

## 2022-06-30 LAB — BPAM RBC: Unit Type and Rh: 5100

## 2022-06-30 LAB — URIC ACID: Uric Acid, Serum: 9.4 mg/dL — ABNORMAL HIGH (ref 3.7–8.6)

## 2022-06-30 MED ORDER — DIPHENHYDRAMINE HCL 50 MG/ML IJ SOLN
12.5000 mg | Freq: Once | INTRAMUSCULAR | Status: AC
  Administered 2022-06-30: 12.5 mg via INTRAVENOUS
  Filled 2022-06-30: qty 1

## 2022-06-30 MED ORDER — SODIUM CHLORIDE 0.9% IV SOLUTION: Freq: Once | INTRAVENOUS | Status: AC

## 2022-06-30 MED ORDER — SODIUM CHLORIDE 0.45 % IV SOLN: INTRAVENOUS | Status: AC

## 2022-06-30 MED ORDER — ACETAMINOPHEN 325 MG PO TABS
650.0000 mg | ORAL_TABLET | Freq: Once | ORAL | Status: AC
  Administered 2022-06-30: 650 mg via ORAL
  Filled 2022-06-30: qty 2

## 2022-06-30 MED ORDER — SODIUM ZIRCONIUM CYCLOSILICATE 10 G PO PACK
10.0000 g | PACK | Freq: Once | ORAL | Status: AC
  Administered 2022-06-30: 10 g via ORAL
  Filled 2022-06-30: qty 1

## 2022-06-30 NOTE — Progress Notes (Signed)
Subjective: [name] [name] is a 42 year old male with a medical history significant for sickle cell disease, chronic pain syndrome, opiate dependence and tolerance, and history of anemia of chronic disease, stage IV chronic kidney disease, anemia of chronic disease, and marijuana abuse was admitted for sickle cell pain crisis. Patient continues to complain of right foot pain.  Reviewed x-ray of right foot shows patchy sclerosis and lucency with the distal first metatarsal, navicular, talus, and cuboid, consistent with known bone infarcts.  Increased mild distal subcortical collapse with a distal great toe metatarsal head compared to right foot radiographs on 11/19/2015.  Right foot pain has improved slightly post steroid.  Patient rates pain as 7/10.  Patient continues to have pain primarily to right lower extremity, especially right foot.  Pain is intensified by bearing weight.   Denies headache, blurry vision, dizziness, urinary symptoms, nausea, vomiting, or diarrhea.  Objective:  Vital signs in last 24 hours:  Vitals:   06/30/22 0155 06/30/22 0534 06/30/22 0541 06/30/22 0842  BP: 102/60  (!) 95/54   Pulse: (!) 105  82   Resp: 14 14 15 16   Temp: 98.9 F (37.2 C)  98.4 F (36.9 C)   TempSrc: Oral  Oral   SpO2: 94%  96% 95%  Weight:      Height:        Intake/Output from previous day:   Intake/Output Summary (Last 24 hours) at 06/30/2022 1005 Last data filed at 06/30/2022 0538 Gross per 24 hour  Intake --  Output 200 ml  Net -200 ml    Physical Exam: General: Alert, awake, oriented x3, in no acute distress.  HEENT: [place]/AT PEERL, EOMI Neck: Trachea midline,  no masses, no thyromegal,y no JVD, no carotid bruit OROPHARYNX:  Moist, No exudate/ erythema/lesions.  Heart: Regular rate and rhythm, without murmurs, rubs, gallops, PMI non-displaced, no heaves or thrills on palpation.  Lungs: Clear to auscultation, no wheezing or rhonchi noted. No increased vocal fremitus resonant to  percussion  Abdomen: Soft, nontender, nondistended, positive bowel sounds, no masses no hepatosplenomegaly noted..  Neuro: No focal neurological deficits noted cranial nerves II through XII grossly intact. DTRs 2+ bilaterally upper and lower extremities. Strength 5 out of 5 in bilateral upper and lower extremities. Musculoskeletal: No warm swelling or erythema around joints, no spinal tenderness noted. Psychiatric: Patient alert and oriented x3, good insight and cognition, good recent to remote recall. Lymph node survey: No cervical axillary or inguinal lymphadenopathy noted.  Lab Results:  Basic Metabolic Panel:    Component Value Date/Time   NA 131 (L) 06/30/2022 0606   NA 137 01/24/2022 1224   K 6.6 (HH) 06/30/2022 0606   CL 107 06/30/2022 0606   CO2 13 (L) 06/30/2022 0606   BUN 74 (H) 06/30/2022 0606   BUN 35 (H) 01/24/2022 1224   CREATININE 3.98 (H) 06/30/2022 0606   CREATININE 1.45 (H) 05/12/2014 1430   GLUCOSE 112 (H) 06/30/2022 0606   CALCIUM 8.8 (L) 06/30/2022 0606   CBC:    Component Value Date/Time   WBC 10.7 (H) 06/30/2022 0606   HGB 7.0 (L) 06/30/2022 0606   HGB 9.2 (L) 10/25/2021 1125   HCT 20.1 (L) 06/30/2022 0606   HCT 25.1 (L) 10/25/2021 1125   PLT 134 (L) 06/30/2022 0606   PLT 194 10/25/2021 1125   MCV 118.9 (H) 06/30/2022 0606   MCV 107 (H) 10/25/2021 1125   NEUTROABS 6.1 06/27/2022 0920   NEUTROABS 6.6 10/25/2021 1125   LYMPHSABS 7.4 (H)  06/27/2022 0920   LYMPHSABS 5.7 (H) 10/25/2021 1125   MONOABS 2.1 (H) 06/27/2022 0920   EOSABS 0.2 06/27/2022 0920   EOSABS 0.4 10/25/2021 1125   BASOSABS 0.0 06/27/2022 0920   BASOSABS 0.1 10/25/2021 1125    No results found for this or any previous visit (from the past 240 hour(s)).  Studies/Results: DG Foot Complete Right  Result Date: 06/29/2022 CLINICAL DATA:  Right foot pain. History of sickle cell anemia. 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 EXAM: RIGHT FOOT COMPLETE - 3+ VIEW  COMPARISON:  Right foot radiographs 11/19/2015; correlation with MRI left forefoot 02/24/2019 and left foot radiographs 07/17/2021 FINDINGS: There is patchy sclerosis and lucency throughout the distal first metatarsal from the proximal metaphysis through the distal articular surface. This is similar to the radiographic appearance of the contralateral left foot on 07/17/2021 radiographs, shown to represent multiple bone infarcts on 02/24/2019 left foot MRI. There is increased mild cortical collapse within the distal great toe metatarsal head compared to right foot radiographs 11/19/2015. Mild peripheral degenerative spurring. Compared to 11/19/2015, there is again patchy sclerosis and lucency throughout the navicular and minimal subchondral flattening/collapse at the distal medial and lateral aspects of the navicular at the articulations with the intermediate and lateral cuneiforms, respectively. There is also patchy sclerosis and lucency within the posterior aspect of the talus and the posterior aspect of the cuboid, likely mildly increased from 11/19/2015. IMPRESSION: 1. Patchy sclerosis and lucency within the distal first metatarsal, navicular, talus, and cuboid, consistent with known bone infarcts. 2. Increased mild distal subcortical collapse within the distal great toe metatarsal head compared to right foot radiographs 11/19/2015. Electronically Signed   By: Neita Garnet M.D.   On: 06/29/2022 11:47    Medications: Scheduled Meds:  sodium chloride   Intravenous Once   acetaminophen  650 mg Oral Once   amitriptyline  50 mg Oral QHS   calcitRIOL  0.5 mcg Oral Q breakfast   Deferiprone (Twice Daily)  3,000 mg Oral Daily   diphenhydrAMINE  12.5 mg Intravenous Once   folic acid  1 mg Oral Daily   heparin injection (subcutaneous)  5,000 Units Subcutaneous Q8H   HYDROmorphone   Intravenous Q4H   hydroxyurea  1,000 mg Oral Once per day on Mon Fri   hydroxyurea  500 mg Oral Once per day on Sun Tue Wed Thu  Sat   oxyCODONE  15 mg Oral Q12H   patiromer  16.8 g Oral Daily   senna-docusate  1 tablet Oral BID   sodium bicarbonate  1,950 mg Oral BID   sodium zirconium cyclosilicate  10 g Oral Once   voxelotor  1,500 mg Oral Q breakfast   Continuous Infusions:  sodium chloride     PRN Meds:.acetaminophen, diphenhydrAMINE, naloxone **AND** sodium chloride flush, ondansetron (ZOFRAN) IV, oxyCODONE, polyethylene glycol  Consultants: none  Procedures: none  Antibiotics: none  Assessment/Plan: Principal Problem:   Sickle cell pain crisis (HCC) Active Problems:   CKD (chronic kidney disease), stage IV (HCC)   Systolic murmur   Marijuana use  Right foot pain:  Continue pain management.  Hyperkalemia: Potassium has increased above baseline at 6.0.  Patient did not bring in Veltassa from home.  Initiate Lokelma x 1 dose.  Recheck potassium this afternoon.  Sickle cell disease with pain crisis: Continue IV Dilaudid PCA, weaning.  Restart IV fluids, 0.45% saline at 100 mL/h OxyContin 15 mg every 12 hours Continue oxycodone 10 mg every 6 hours as needed  for severe breakthrough pain. Monitor vital signs very closely, reevaluate pain scale regularly, and supplemental oxygen as needed  Stage IV chronic kidney disease: Stable and consistent with patient's baseline.  Continue to avoid all nephrotoxins.  Follow labs in AM.  Anemia of chronic disease: Patient's hemoglobin has decreased to 7.0 g/dL.  Will transfuse 1 unit PRBCs today. Defer to sickle cell team for any nonemergent blood transfusions Labs in AM  Marijuana abuse: On admission, patient counseled on the dangers of smoking especially in the setting of sickle cell disease.  Expressed understanding.  Code Status: Full Code Family Communication: N/A Disposition Plan: Not yet ready for discharge  Venus Gilles Rennis Petty  APRN, MSN, FNP-C [facility] [address] [place], [place]  [address] [phone]  If 7PM-7AM, please contact night-coverage.  06/30/2022, 10:05 AM  LOS: 3 days

## 2022-07-01 ENCOUNTER — Other Ambulatory Visit (HOSPITAL_COMMUNITY): Payer: Self-pay

## 2022-07-01 DIAGNOSIS — D5700 Hb-SS disease with crisis, unspecified: Secondary | ICD-10-CM | POA: Diagnosis not present

## 2022-07-01 MED ORDER — COLCHICINE 0.6 MG PO TABS: 0.3000 mg | ORAL_TABLET | Freq: Every day | ORAL | 0 refills | Status: DC

## 2022-07-01 MED ORDER — COLCHICINE 0.3 MG HALF TABLET
0.3000 mg | ORAL_TABLET | Freq: Every day | ORAL | Status: DC
  Administered 2022-07-01: 0.3 mg via ORAL
  Filled 2022-07-01: qty 1

## 2022-07-01 MED ORDER — ALLOPURINOL 100 MG PO TABS: 50.0000 mg | ORAL_TABLET | Freq: Every day | ORAL | 0 refills | Status: DC

## 2022-07-01 MED ORDER — ALLOPURINOL 100 MG PO TABS
50.0000 mg | ORAL_TABLET | Freq: Every day | ORAL | Status: DC
  Administered 2022-07-01: 50 mg via ORAL
  Filled 2022-07-01: qty 1

## 2022-07-01 NOTE — Discharge Summary (Signed)
Physician Discharge Summary   Patient: [name] [name]. MRN: [id number] DOB: May 04, 1980  Admit date:     06/27/2022  Discharge date: 07/01/2022  Discharge Physician: Lonia Blood   PCP: Massie Maroon, FNP   Recommendations at discharge:   Patient discharged home on colchicine and allopurinol Dose adjusted medication she will continue Follow-up with PCP  Discharge Diagnoses: Principal Problem:   Sickle cell pain crisis (HCC) Active Problems:   CKD (chronic kidney disease), stage IV (HCC)   Systolic murmur   Marijuana use  Resolved Problems:   * No resolved hospital problems. *  Hospital Course: [name] [name] is a 42 year old male with a medical history significant for sickle cell disease, stage IV chronic kidney disease, opiate dependence and tolerance, anemia of chronic disease, and chronic marijuana use presents to sickle cell day infusion clinic with bilateral foot pain that is consistent with previous pain crisis.  Patient complaining of bilateral foot pain, especially to right that is worsened with weightbearing patient states that pain intensity increased on yesterday and has been unrelieved by his home medications.  He has not identified any inciting factors concerning crisis.  He rates his pain as 10/10, constant, and throbbing.  He says that he has not been able to get any relief from his home medications that were last taken this a.m.  He denies any fever, chills, chest pain, dizziness, or shortness of breath.  No urinary symptoms, nausea, vomiting, or diarrhea.   Patient was admitted and initiated on sickle cell pathway with IV Dilaudid, no Toradol due to renal function, IV fluids, other supportive care.  He responded to treatment.  Later pain was suspected to right lower extremity consistent with gouty arthritis.  Patient started on allopurinol and colchicine.  He has been able to put some weight on his leg.  Pain is down to 4 out of 10.  At this point patient is  being discharged home to follow-up with PCP.  Prescriptions given for the colchicine and allopurinol.  Also to continue his other pain medications.  Assessment and Plan: No notes have been filed under this hospital service. Service: Hospitalist        Consultants: None Procedures performed: Right foot x-ray Disposition: Home Diet recommendation:  Renal diet DISCHARGE MEDICATION: Allergies as of 07/01/2022       Reactions   Nsaids Other (See Comments)   Kidney disease   Morphine And Codeine Other (See Comments)   "Real bad headaches"         Medication List     TAKE these medications    allopurinol 100 MG tablet Commonly known as: ZYLOPRIM Take 0.5 tablets (50 mg total) by mouth daily.   amitriptyline 25 MG tablet Commonly known as: ELAVIL Take 50 mg by mouth at bedtime.   calcitRIOL 0.25 MCG capsule Commonly known as: ROCALTROL Take 0.25 mcg by mouth daily with breakfast.   colchicine 0.6 MG tablet Take 0.5 tablets (0.3 mg total) by mouth daily.   Ferriprox Twice-A-Day 1000 MG Tabs Generic drug: Deferiprone (Twice Daily) Take 3,000 mg by mouth daily.   folic acid 1 MG tablet Commonly known as: FOLVITE TAKE 1 TABLET BY MOUTH EVERY DAY What changed: when to take this   hydroxyurea 500 MG capsule Commonly known as: HYDREA Take 1 capsule by mouth in the morning on Sun/Tues/Wed/Thurs/Sat and 2 capsules on Mon/Fri What changed: additional instructions Notes to patient: AS SCHEDULED   Oxbryta 500 MG Tabs tablet Generic drug: voxelotor Take 1,500 mg by  mouth daily with breakfast.   OxyCONTIN 15 mg 12 hr tablet Generic drug: oxyCODONE Take 1 tablet (15 mg) by mouth every 12 hours.   Oxycodone HCl 10 MG Tabs Take 1 tablet (10 mg) by mouth every 6 hours as needed for pain.   sodium bicarbonate 650 MG tablet Take 1,950 mg by mouth daily.   Tylenol 8 Hour Arthritis Pain 650 MG CR tablet Generic drug: acetaminophen Take 650 mg by mouth every 8 (eight)  hours as needed for pain.   Veltassa 16.8 g Pack Generic drug: Patiromer Sorbitex Calcium Take 16.8 g by mouth daily.        Follow-up Information     [employer] [facility] [employer]. Call.   Contact information: [address] [place], [place] [address] [phone]        [employer] [facility] [employer] [facility]. Call.   Contact information: Located in: [facility] [address] [place], [place] [address] [phone]               Discharge Exam: Filed Weights   06/27/22 1436  Weight: 54 kg   Constitutional: NAD, calm, comfortable Eyes: PERRL, lids and conjunctivae normal ENMT: Mucous membranes are moist. Posterior pharynx clear of any exudate or lesions.Normal dentition.  Neck: normal, supple, no masses, no thyromegaly Respiratory: clear to auscultation bilaterally, no wheezing, no crackles. Normal respiratory effort. No accessory muscle use.  Cardiovascular: Regular rate and rhythm, no murmurs / rubs / gallops. No extremity edema. 2+ pedal pulses. No carotid bruits.  Abdomen: no tenderness, no masses palpated. No hepatosplenomegaly. Bowel sounds positive.  Musculoskeletal: Good range of motion, no joint swelling or tenderness, Skin: no rashes, lesions, ulcers. No induration Neurologic: CN 2-12 grossly intact. Sensation intact, DTR normal. Strength 5/5 in all 4.  Psychiatric: Normal judgment and insight. Alert and oriented x 3. Normal mood   Condition at discharge: good  The results of significant diagnostics from this hospitalization (including imaging, microbiology, ancillary and laboratory) are listed below for reference.   Imaging Studies: DG Foot Complete Right  Result Date: 06/29/2022 CLINICAL DATA:  Right foot pain. History of sickle cell anemia. 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 EXAM: RIGHT FOOT COMPLETE - 3+ VIEW COMPARISON:  Right foot radiographs 11/19/2015; correlation with MRI left forefoot 02/24/2019  and left foot radiographs 07/17/2021 FINDINGS: There is patchy sclerosis and lucency throughout the distal first metatarsal from the proximal metaphysis through the distal articular surface. This is similar to the radiographic appearance of the contralateral left foot on 07/17/2021 radiographs, shown to represent multiple bone infarcts on 02/24/2019 left foot MRI. There is increased mild cortical collapse within the distal great toe metatarsal head compared to right foot radiographs 11/19/2015. Mild peripheral degenerative spurring. Compared to 11/19/2015, there is again patchy sclerosis and lucency throughout the navicular and minimal subchondral flattening/collapse at the distal medial and lateral aspects of the navicular at the articulations with the intermediate and lateral cuneiforms, respectively. There is also patchy sclerosis and lucency within the posterior aspect of the talus and the posterior aspect of the cuboid, likely mildly increased from 11/19/2015. IMPRESSION: 1. Patchy sclerosis and lucency within the distal first metatarsal, navicular, talus, and cuboid, consistent with known bone infarcts. 2. Increased mild distal subcortical collapse within the distal great toe metatarsal head compared to right foot radiographs 11/19/2015. Electronically Signed   By: Neita Garnet M.D.   On: 06/29/2022 11:47    Microbiology: Results for orders  placed or performed during the hospital encounter of 04/11/21  Resp Panel by RT-PCR (Flu A&B, Covid) Nasopharyngeal Swab     Status: None   Collection Time: 04/11/21  9:36 AM   Specimen: Nasopharyngeal Swab; Nasopharyngeal(NP) swabs in vial transport medium  Result Value Ref Range Status   SARS Coronavirus 2 by RT PCR NEGATIVE NEGATIVE Final    Comment: (NOTE) SARS-CoV-2 target nucleic acids are NOT DETECTED.  The SARS-CoV-2 RNA is generally detectable in upper respiratory specimens during the acute phase of infection. The lowest concentration of SARS-CoV-2  viral copies this assay can detect is 138 copies/mL. A negative result does not preclude SARS-Cov-2 infection and should not be used as the sole basis for treatment or other patient management decisions. A negative result may occur with  improper specimen collection/handling, submission of specimen other than nasopharyngeal swab, presence of viral mutation(s) within the areas targeted by this assay, and inadequate number of viral copies(<138 copies/mL). A negative result must be combined with clinical observations, patient history, and epidemiological information. The expected result is Negative.  Fact Sheet for Patients:  BloggerCourse.com  Fact Sheet for Healthcare Providers:  SeriousBroker.it  This test is no t yet approved or cleared by the [place] FDA and  has been authorized for detection and/or diagnosis of SARS-CoV-2 by FDA under an Emergency Use Authorization (EUA). This EUA will remain  in effect (meaning this test can be used) for the duration of the COVID-19 declaration under Section 564(b)(1) of the Act, 21 U.S.C.section 360bbb-3(b)(1), unless the authorization is terminated  or revoked sooner.       Influenza A by PCR NEGATIVE NEGATIVE Final   Influenza B by PCR NEGATIVE NEGATIVE Final    Comment: (NOTE) The Xpert Xpress SARS-CoV-2/FLU/RSV plus assay is intended as an aid in the diagnosis of influenza from Nasopharyngeal swab specimens and should not be used as a sole basis for treatment. Nasal washings and aspirates are unacceptable for Xpert Xpress SARS-CoV-2/FLU/RSV testing.  Fact Sheet for Patients: BloggerCourse.com  Fact Sheet for Healthcare Providers: SeriousBroker.it  This test is not yet approved or cleared by the [place] FDA and has been authorized for detection and/or diagnosis of SARS-CoV-2 by FDA under an Emergency Use Authorization  (EUA). This EUA will remain in effect (meaning this test can be used) for the duration of the COVID-19 declaration under Section 564(b)(1) of the Act, 21 U.S.C. section 360bbb-3(b)(1), unless the authorization is terminated or revoked.  Performed at [facility], [address], [place], [place] [address]    *Note: Due to a large number of results and/or encounters for the requested time period, some results have not been displayed. A complete set of results can be found in Results Review.    Labs: CBC: Recent Labs  Lab 06/27/22 0920 06/28/22 0617 06/29/22 1135 06/30/22 0606  WBC 15.8* 16.7* 15.7* 10.7*  NEUTROABS 6.1  --   --   --   HGB 8.1* 7.2* 7.5* 7.0*  HCT 22.3* 20.6* 21.3* 20.1*  MCV 117.4* 119.1* 119.0* 118.9*  PLT 164 134* 155 134*   Basic Metabolic Panel: Recent Labs  Lab 06/27/22 0920 06/28/22 0617 06/29/22 1135 06/30/22 0606 06/30/22 2140  NA 134* 135 133* 131* 134*  K 4.9 5.6* 5.4* 6.6* 4.7  CL 104 106 108 107 106  CO2 18* 18* 15* 13* 17*  GLUCOSE 105* 86 98 112* 108*  BUN 63* 58* 57* 74* 80*  CREATININE 4.15* 3.71* 3.65* 3.98* 4.17*  CALCIUM 8.6*  8.8* 8.9 8.8* 9.5   Liver Function Tests: Recent Labs  Lab 06/27/22 0920 06/30/22 0606  AST 83* 57*  ALT 66* 56*  ALKPHOS 167* 148*  BILITOT 1.5* 1.5*  PROT 7.7 7.6  ALBUMIN 3.5 3.6   CBG: No results for input(s): "GLUCAP" in the last 168 hours.  Discharge time spent: greater than patient was admitted to the hospital 30 minutes.  SignedLonia Blood, MD [facility] 07/01/2022

## 2022-07-01 NOTE — Hospital Course (Signed)
[name] [name] is a 42 year old male with a medical history significant for sickle cell disease, stage IV chronic kidney disease, opiate dependence and tolerance, anemia of chronic disease, and chronic marijuana use presents to sickle cell day infusion clinic with bilateral foot pain that is consistent with previous pain crisis.  Patient complaining of bilateral foot pain, especially to right that is worsened with weightbearing patient states that pain intensity increased on yesterday and has been unrelieved by his home medications.  He has not identified any inciting factors concerning crisis.  He rates his pain as 10/10, constant, and throbbing.  He says that he has not been able to get any relief from his home medications that were last taken this a.m.  He denies any fever, chills, chest pain, dizziness, or shortness of breath.  No urinary symptoms, nausea, vomiting, or diarrhea.   Patient was admitted and initiated on sickle cell pathway with IV Dilaudid, no Toradol due to renal function, IV fluids, other supportive care.  He responded to treatment.  Later pain was suspected to right lower extremity consistent with gouty arthritis.  Patient started on allopurinol and colchicine.  He has been able to put some weight on his leg.  Pain is down to 4 out of 10.  At this point patient is being discharged home to follow-up with PCP.  Prescriptions given for the colchicine and allopurinol.  Also to continue his other pain medications.

## 2022-07-01 NOTE — TOC Transition Note (Signed)
Transition of Care [facility]) - CM/SW Discharge Note  Patient Details  Name: [name] [name]. MRN: [id number] Date of Birth: 12/28/1980  [facility] CM/SW Contact:  Ewing Schlein, LCSW Phone Number: 07/01/2022, 2:13 PM  Clinical Narrative: Food pantry information added to AVS per SDOH screening. TOC signing off.  Final next level of care: Home/Self Care Barriers to Discharge: No Barriers Identified  Patient Goals and CMS Choice Choice offered to / list presented to : NA  Discharge Plan and Services Additional resources added to the After Visit Summary for         DME Arranged: N/A DME Agency: NA  Social Determinants of Health (SDOH) Interventions SDOH Screenings   Food Insecurity: Food Insecurity Present (06/27/2022)  Housing: Low Risk  (06/27/2022)  Transportation Needs: No Transportation Needs (06/27/2022)  Utilities: Not At Risk (06/27/2022)  Alcohol Screen: Low Risk  (02/09/2022)  Depression (PHQ2-9): Low Risk  (02/09/2022)  Financial Resource Strain: Low Risk  (02/09/2022)  Physical Activity: Sufficiently Active (02/09/2022)  Social Connections: Socially Isolated (02/09/2022)  Stress: No Stress Concern Present (02/09/2022)  Tobacco Use: High Risk (06/27/2022)   Readmission Risk Interventions    12/27/2021   11:17 AM 10/12/2021   10:41 AM 08/01/2021    2:02 PM  Readmission Risk Prevention Plan  Transportation Screening Complete Complete Complete  Medication Review Oceanographer) Complete Complete Complete  PCP or Specialist appointment within 3-5 days of discharge Complete Complete Complete  HRI or Home Care Consult Complete Complete Complete  SW Recovery Care/Counseling Consult Complete Complete Complete  Palliative Care Screening Not Applicable Not Applicable Not Applicable  Skilled Nursing Facility Not Applicable Not Applicable Not Applicable

## 2022-07-02 ENCOUNTER — Other Ambulatory Visit: Payer: Self-pay | Admitting: Family Medicine

## 2022-07-02 DIAGNOSIS — D571 Sickle-cell disease without crisis: Secondary | ICD-10-CM

## 2022-07-02 DIAGNOSIS — G894 Chronic pain syndrome: Secondary | ICD-10-CM

## 2022-07-03 ENCOUNTER — Other Ambulatory Visit (HOSPITAL_COMMUNITY): Payer: Self-pay

## 2022-07-03 ENCOUNTER — Other Ambulatory Visit: Payer: Self-pay

## 2022-07-03 ENCOUNTER — Telehealth: Payer: Self-pay

## 2022-07-03 ENCOUNTER — Other Ambulatory Visit: Payer: Self-pay | Admitting: Family Medicine

## 2022-07-03 DIAGNOSIS — D571 Sickle-cell disease without crisis: Secondary | ICD-10-CM

## 2022-07-03 DIAGNOSIS — G894 Chronic pain syndrome: Secondary | ICD-10-CM

## 2022-07-03 LAB — TYPE AND SCREEN
Antibody Screen: NEGATIVE
Unit division: 0

## 2022-07-03 LAB — BPAM RBC
Blood Product Expiration Date: 202406112359
Blood Product Expiration Date: 202406202359
ISSUE DATE / TIME: 202405171112
ISSUE DATE / TIME: 202405171200
Unit Type and Rh: 5100

## 2022-07-03 NOTE — Transitions of Care (Post Inpatient/ED Visit) (Signed)
07/03/2022  Name: [name] [name]. MRN: [id number] DOB: [birth year]  Today's TOC FU Call Status: Today's TOC FU Call Status:: Successful TOC FU Call Competed TOC FU Call Complete Date: 07/03/22  Transition Care Management Follow-up Telephone Call Date of Discharge: 07/01/22 Discharge Facility: [facility]) Type of Discharge: Inpatient Admission Primary Inpatient Discharge Diagnosis:: Sickle cell pain crisis How have you been since you were released from the hospital?: Better Any questions or concerns?: No  Items Reviewed: Did you receive and understand the discharge instructions provided?: Yes Medications obtained,verified, and reconciled?: Yes (Medications Reviewed) Any new allergies since your discharge?: No Dietary orders reviewed?: NA Do you have support at home?: Yes People in Home: significant other  Medications Reviewed Today: Medications Reviewed Today     Reviewed by Leigh Aurora, CMA (Certified Medical Assistant) on 07/03/22 at 1516  Med List Status: <None>   Medication Order Taking? Sig Documenting Provider Last Dose Status Informant  allopurinol (ZYLOPRIM) 100 MG tablet [id number]  Take 0.5 tablets (50 mg total) by mouth daily. Rometta Emery, MD  Active   amitriptyline (ELAVIL) 25 MG tablet [id number] No Take 50 mg by mouth at bedtime. [provider] 06/25/2022 pm Active Self  calcitRIOL (ROCALTROL) 0.25 MCG capsule [id number] No Take 0.25 mcg by mouth daily with breakfast. [provider] 06/25/2022 Active Self  colchicine 0.6 MG tablet [id number]  Take 0.5 tablets (0.3 mg total) by mouth daily. Rometta Emery, MD  Active   Deferiprone, Twice Daily, (FERRIPROX TWICE-A-DAY) 1000 MG TABS [id number] No Take 3,000 mg by mouth daily. Massie Maroon, FNP 06/25/2022 Active Self  folic acid (FOLVITE) 1 MG tablet [id number] No TAKE 1 TABLET BY MOUTH EVERY DAY  Patient taking differently: Take 1 mg by mouth every morning.   Massie Maroon, FNP 06/25/2022 Active Self           Med Note (COFFELL, Marzella Schlein   Thu Jun 23, 2021 11:44 AM)    hydroxyurea (HYDREA) 500 MG capsule [id number] No Take 1 capsule by mouth in the morning on Sun/Tues/Wed/Thurs/Sat and 2 capsules on Mon/Fri  Patient taking differently: Take 500-1,000 mg by mouth See admin instructions. Take 500 mg by mouth in the morning on Sun/Tues/Wed/Thurs/Sat and 1,000 mg on Mon/Fri   Massie Maroon, FNP 06/25/2022 Active Self  oxyCODONE (OXYCONTIN) 15 mg 12 hr tablet [id number] No Take 1 tablet (15 mg) by mouth every 12 hours. Massie Maroon, FNP 06/27/2022 0630 Active Self  Oxycodone HCl 10 MG TABS [id number] No Take 1 tablet (10 mg) by mouth every 6 hours as needed for pain. Massie Maroon, FNP unk Active Self  sodium bicarbonate 650 MG tablet [id number] No Take 1,950 mg by mouth daily. [provider] 06/26/2022 Active Self           Med Note [name], [name]   Tue Jun 27, 2022  3:34 PM) The patient said he takes "3 at one time, once a day"- LF #270/30 days on 06/01/2022  TYLENOL 8 HOUR ARTHRITIS PAIN 650 MG CR tablet [id number] No Take 650 mg by mouth every 8 (eight) hours as needed for pain. [provider] unk Active Self           Med Note Star Age P   Mon Apr 03, 2022  1:32 PM)    VELTASSA 16.8 g PACK [id number] No Take 16.8 g by mouth daily. [provider] 06/25/2022 Active Self  Med Note Nolon Bussing   Mon Apr 03, 2022  1:32 PM)    voxelotor (OXBRYTA) 500 MG TABS tablet [id number] No Take 1,500 mg by mouth daily with breakfast. [provider] 06/26/2022 Active Self            Home Care and Equipment/Supplies: Were Home Health Services Ordered?: NA Any new equipment or medical supplies ordered?: NA  Functional Questionnaire: Do you need assistance with bathing/showering or dressing?: No Do you need assistance with meal preparation?: No Do you need assistance with eating?: No Do you have  difficulty maintaining continence: No Do you need assistance with getting out of bed/getting out of a chair/moving?: No Do you have difficulty managing or taking your medications?: No  Follow up appointments reviewed: PCP Follow-up appointment confirmed?: Yes Date of PCP follow-up appointment?: 07/11/22 (pt states he is coming in for follow up blood work, doesn't need an appointment) Specialist Hospital Follow-up appointment confirmed?: NA Do you need transportation to your follow-up appointment?: No Do you understand care options if your condition(s) worsen?: Yes-patient verbalized understanding    SIGNATURE Agnes Lawrence, CMA (AAMA)  [facility] [phone]

## 2022-07-03 NOTE — Telephone Encounter (Signed)
Caller & Relationship to patient:  MRN #  [id number]   Call Back Number:   Date of Last Office Visit: 07/02/2022     Date of Next Office Visit: 07/25/2022    Medication(s) to be Refilled: Oxy Contin 10 mg  Preferred Pharmacy:   ** Please notify patient to allow 48-72 hours to process** **Let patient know to contact pharmacy at the end of the day to make sure medication is ready. ** **If patient has not been seen in a year or longer, book an appointment **Advise to use MyChart for refill requests OR to contact their pharmacy

## 2022-07-04 ENCOUNTER — Other Ambulatory Visit (HOSPITAL_COMMUNITY): Payer: Self-pay

## 2022-07-05 ENCOUNTER — Other Ambulatory Visit (HOSPITAL_COMMUNITY): Payer: Self-pay

## 2022-07-05 ENCOUNTER — Other Ambulatory Visit: Payer: Self-pay | Admitting: Nurse Practitioner

## 2022-07-05 DIAGNOSIS — G894 Chronic pain syndrome: Secondary | ICD-10-CM

## 2022-07-05 DIAGNOSIS — D571 Sickle-cell disease without crisis: Secondary | ICD-10-CM

## 2022-07-05 MED ORDER — OXYCODONE HCL 10 MG PO TABS
10.0000 mg | ORAL_TABLET | Freq: Four times a day (QID) | ORAL | 0 refills | Status: DC | PRN
Start: 2022-07-07 — End: 2022-07-17

## 2022-07-05 NOTE — Telephone Encounter (Signed)
Please advise in [place] absence. KH

## 2022-07-05 NOTE — Telephone Encounter (Signed)
Please advise in [place] absence

## 2022-07-11 ENCOUNTER — Non-Acute Institutional Stay (HOSPITAL_COMMUNITY)
Admission: RE | Admit: 2022-07-11 | Discharge: 2022-07-11 | Disposition: A | Discharge status: Home / Self Care | Payer: Medicare Other | Source: Ambulatory Visit | Attending: Internal Medicine | Admitting: Internal Medicine

## 2022-07-11 DIAGNOSIS — D5700 Hb-SS disease with crisis, unspecified: Secondary | ICD-10-CM | POA: Insufficient documentation

## 2022-07-11 LAB — CBC WITH DIFFERENTIAL/PLATELET
Abs Immature Granulocytes: 0.05 10*3/uL (ref 0.00–0.07)
Basophils Absolute: 0 10*3/uL (ref 0.0–0.1)
Basophils Relative: 0 %
Eosinophils Absolute: 0.2 10*3/uL (ref 0.0–0.5)
Eosinophils Relative: 2 %
HCT: 26.3 % — ABNORMAL LOW (ref 39.0–52.0)
Hemoglobin: 9 g/dL — ABNORMAL LOW (ref 13.0–17.0)
Immature Granulocytes: 0 %
Lymphocytes Relative: 39 %
Lymphs Abs: 5.1 10*3/uL — ABNORMAL HIGH (ref 0.7–4.0)
MCH: 38.8 pg — ABNORMAL HIGH (ref 26.0–34.0)
MCHC: 34.2 g/dL (ref 30.0–36.0)
MCV: 113.4 fL — ABNORMAL HIGH (ref 80.0–100.0)
Monocytes Absolute: 0.5 10*3/uL (ref 0.1–1.0)
Monocytes Relative: 4 %
Neutro Abs: 7 10*3/uL (ref 1.7–7.7)
Neutrophils Relative %: 55 %
Platelets: 352 10*3/uL (ref 150–400)
RBC: 2.32 MIL/uL — ABNORMAL LOW (ref 4.22–5.81)
RDW: 20.1 % — ABNORMAL HIGH (ref 11.5–15.5)
WBC: 12.9 10*3/uL — ABNORMAL HIGH (ref 4.0–10.5)
nRBC: 0 % (ref 0.0–0.2)

## 2022-07-11 LAB — COMPREHENSIVE METABOLIC PANEL
ALT: 53 U/L — ABNORMAL HIGH (ref 0–44)
AST: 60 U/L — ABNORMAL HIGH (ref 15–41)
Albumin: 3.3 g/dL — ABNORMAL LOW (ref 3.5–5.0)
Alkaline Phosphatase: 138 U/L — ABNORMAL HIGH (ref 38–126)
Anion gap: 12 (ref 5–15)
BUN: 63 mg/dL — ABNORMAL HIGH (ref 6–20)
CO2: 18 mmol/L — ABNORMAL LOW (ref 22–32)
Calcium: 9 mg/dL (ref 8.9–10.3)
Chloride: 106 mmol/L (ref 98–111)
Creatinine, Ser: 3.87 mg/dL — ABNORMAL HIGH (ref 0.61–1.24)
GFR, Estimated: 19 mL/min — ABNORMAL LOW (ref >60)
Glucose, Bld: 90 mg/dL (ref 70–99)
Potassium: 5.4 mmol/L — ABNORMAL HIGH (ref 3.5–5.1)
Sodium: 136 mmol/L (ref 135–145)
Total Bilirubin: 1.3 mg/dL — ABNORMAL HIGH (ref 0.3–1.2)
Total Protein: 7.4 g/dL (ref 6.5–8.1)

## 2022-07-11 LAB — RETICULOCYTES
Immature Retic Fract: 21.9 % — ABNORMAL HIGH (ref 2.3–15.9)
RBC.: 2.35 MIL/uL — ABNORMAL LOW (ref 4.22–5.81)
Retic Count, Absolute: 122.2 10*3/uL (ref 19.0–186.0)
Retic Ct Pct: 5.2 % — ABNORMAL HIGH (ref 0.4–3.1)

## 2022-07-11 NOTE — Progress Notes (Signed)
PATIENT CARE CENTER NOTE   Provider: Hollis, [place], FNP  Procedure: Lab Draw (CBC with differential, Reticulocytes, CMP)  Note: Patient's labs drawn peripherally by phlebotomist. Pt tolerated well. Patient is alert, oriented, and ambulatory at discharge.

## 2022-07-14 IMAGING — CR DG CHEST 2V
2 series · 2 of 2 positions shown · non-contrast
Comparison: 12/07/2020

CLINICAL DATA: Shortness of breath.  Sickle cell crisis.

EXAM:
CHEST - 2 VIEW

[w chest pa]
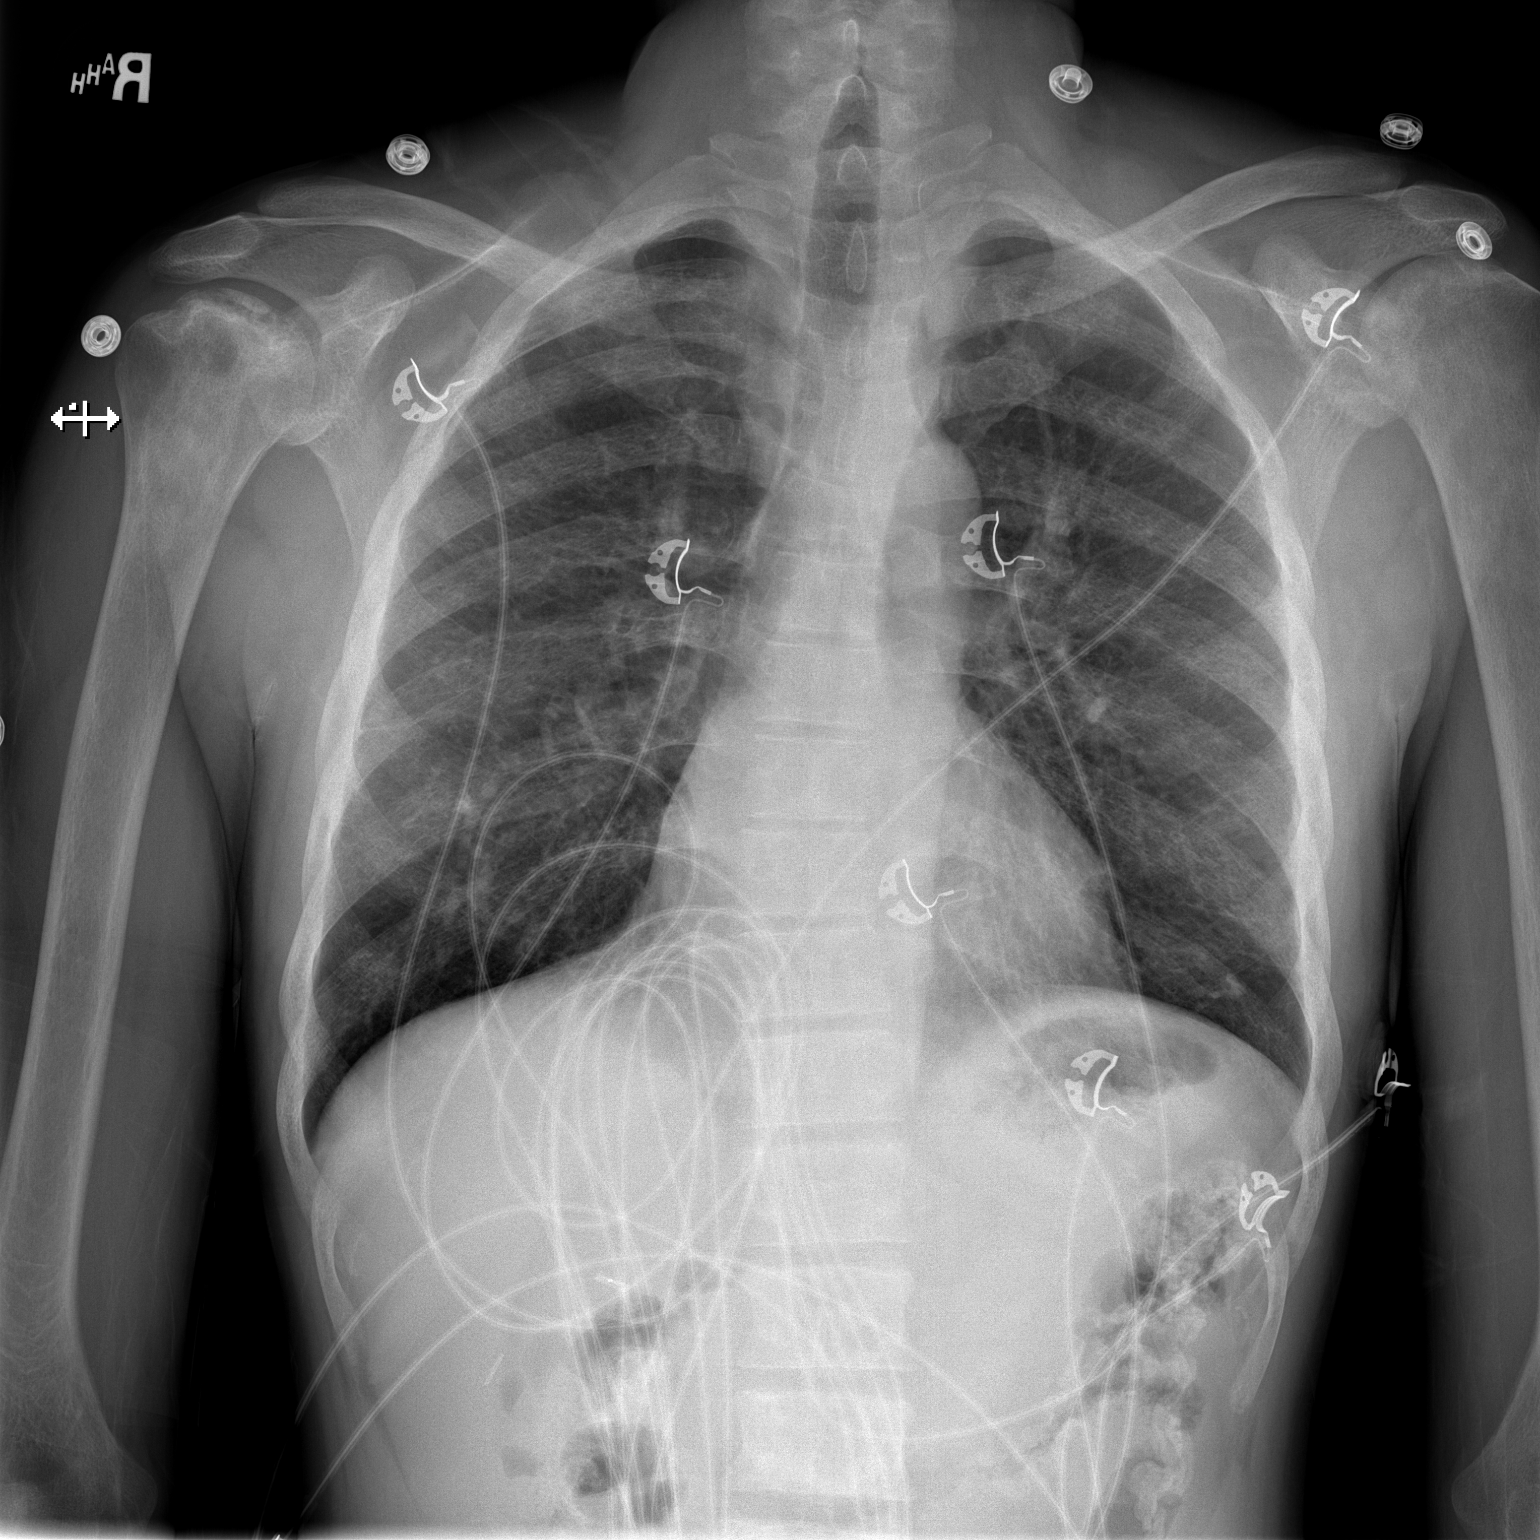

[w chest lat]
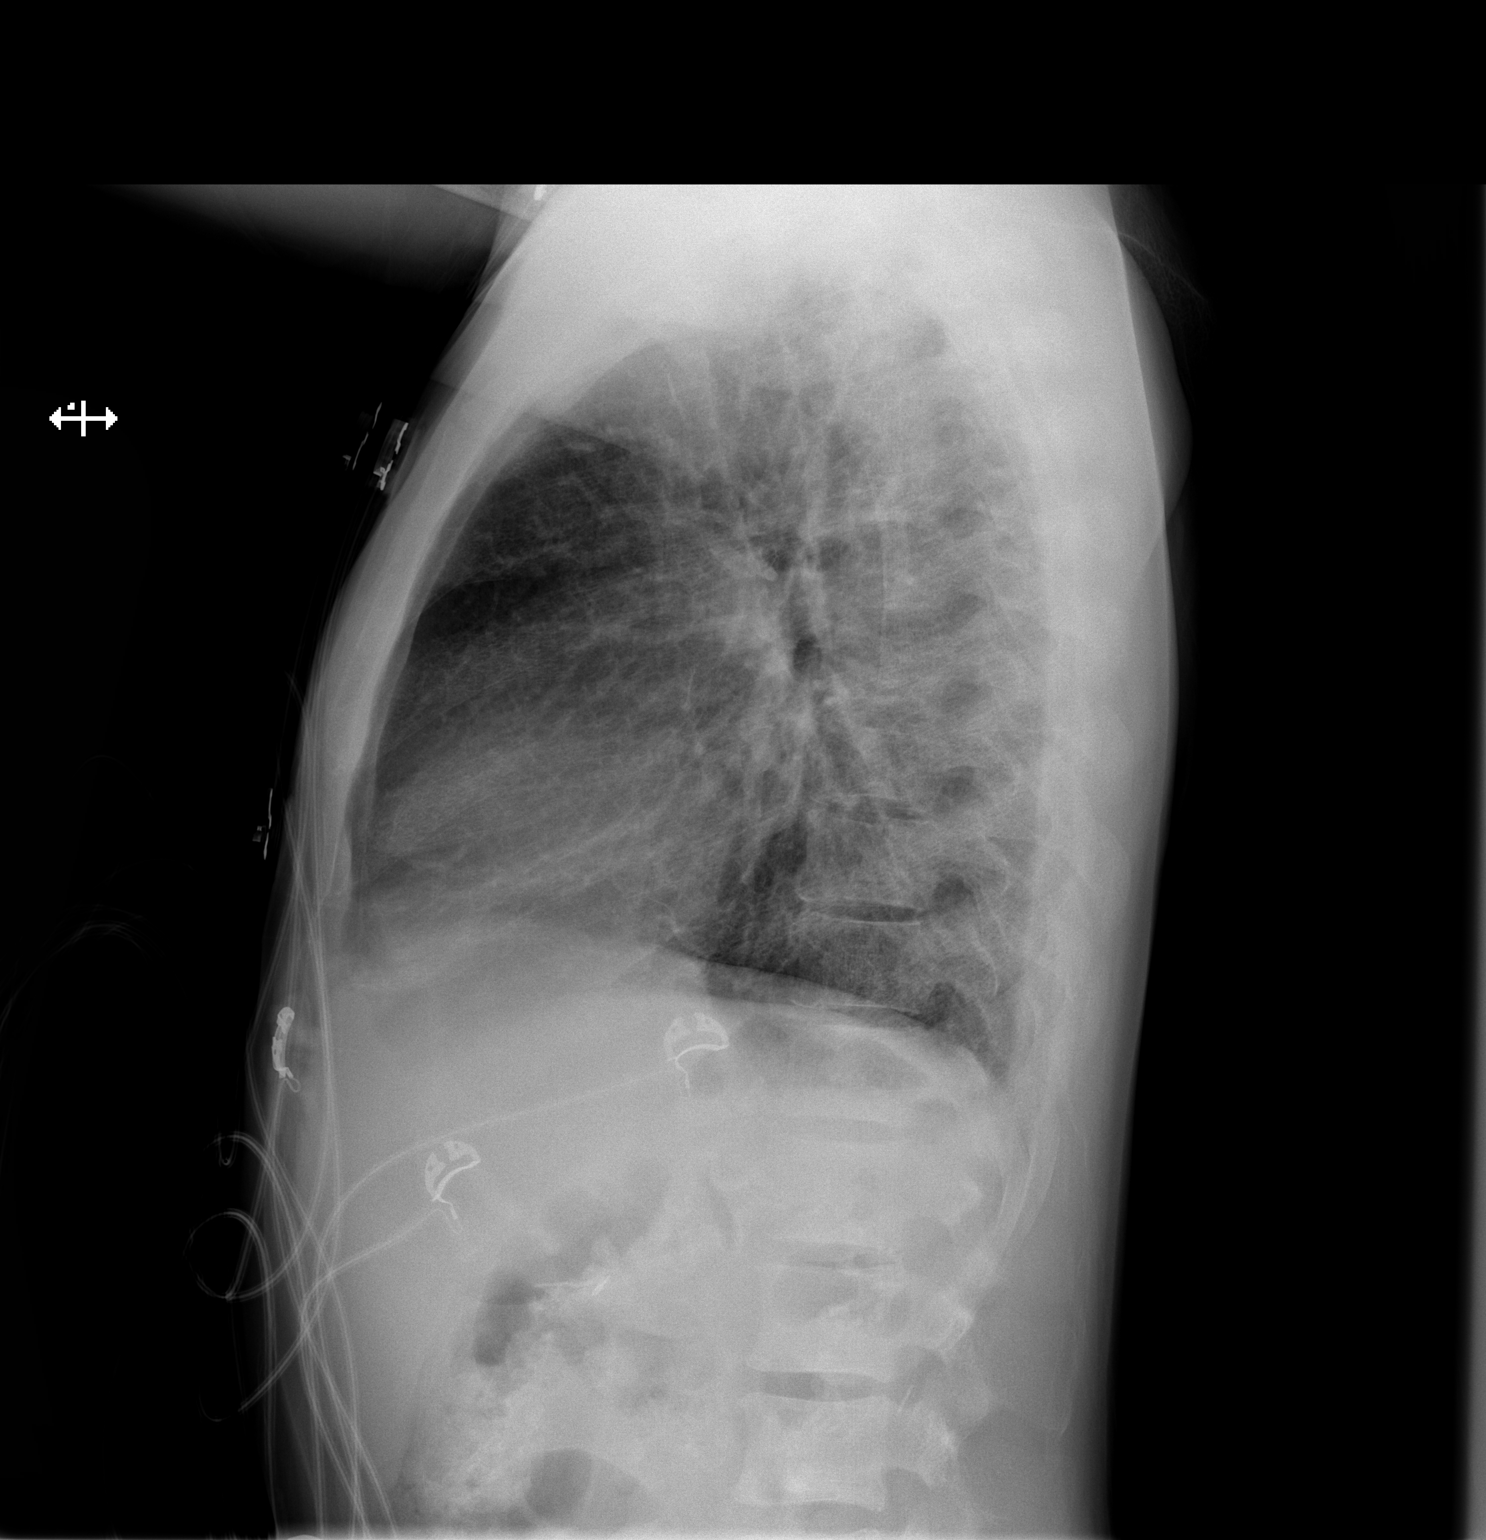

[2 of 2 positions shown; findings below may reference images not displayed]

FINDINGS: Right greater than left humeral head avascular necrosis. Multiple
infarcts involving bilateral ribs. Cholecystectomy. Midline trachea.
Normal heart size and mediastinal contours. No pleural effusion or
pneumothorax. Similar interstitial coarsening which is likely
related to smoking/chronic bronchitis. No lobar consolidation.
IMPRESSION: No acute cardiopulmonary disease.

Peribronchial thickening which may relate to chronic bronchitis or
smoking.

## 2022-07-15 IMAGING — XA IR FLUORO GUIDE CV LINE*R*
2 series · 5 of 5 positions shown · non-contrast
Comparison: none

INDICATION: Sickle cell crisis, access for IV hydration and pain management

[Series 1: ir rad eval and mgt. · 1 of 1 slices shown]
[im 1/1]
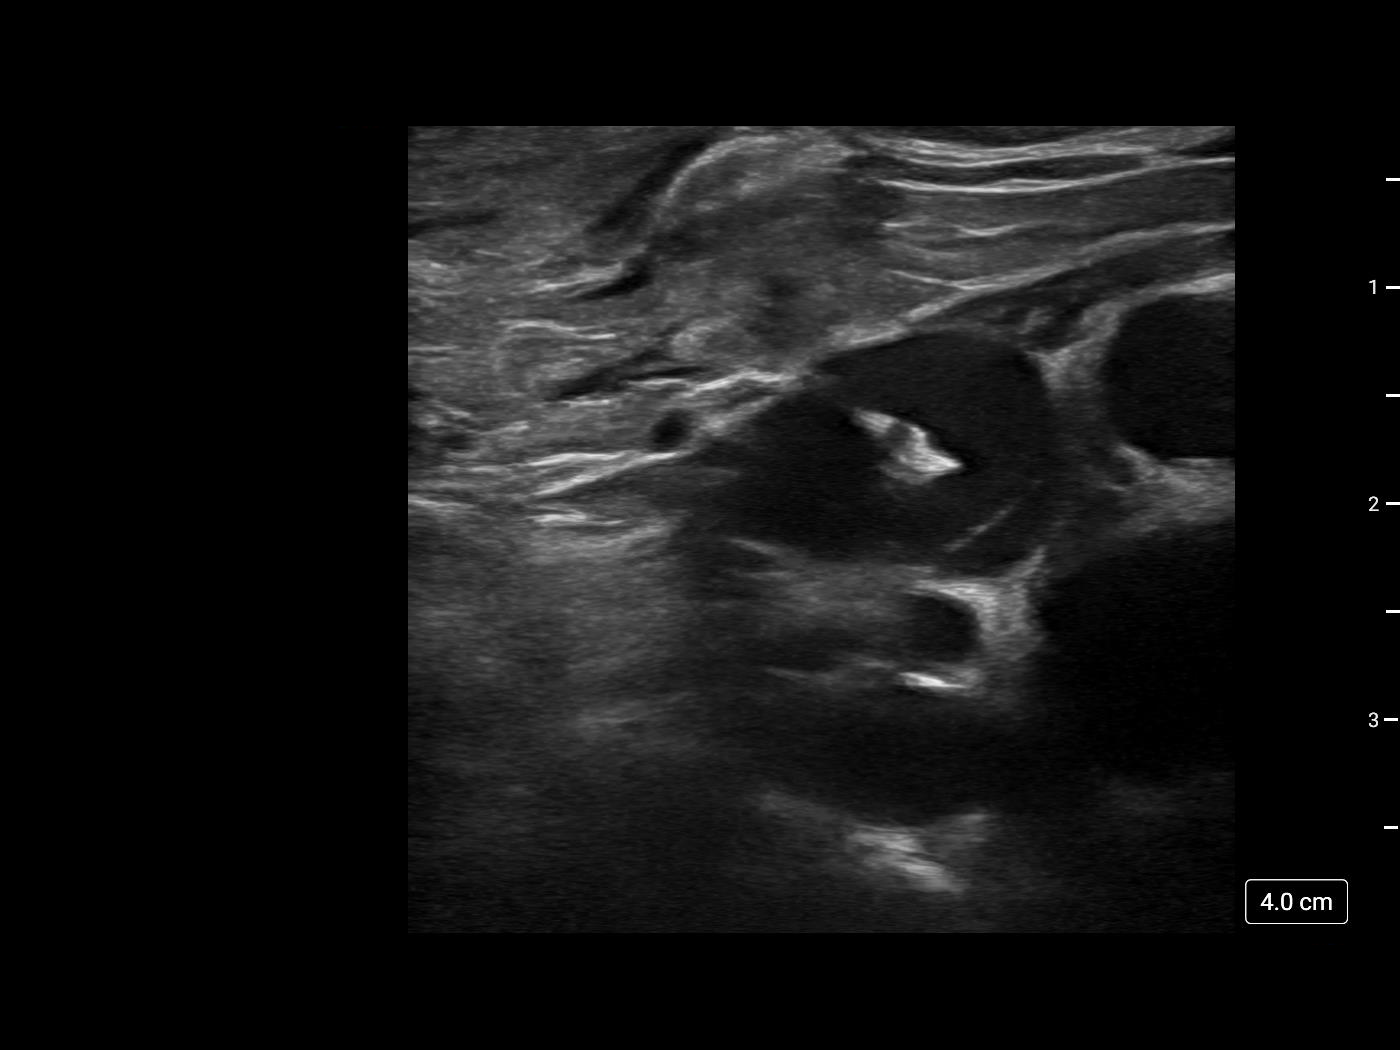

[Series 1: processed: ir radiologist eval & mgmt · 4 of 9 frames shown]
[frame 2/9]
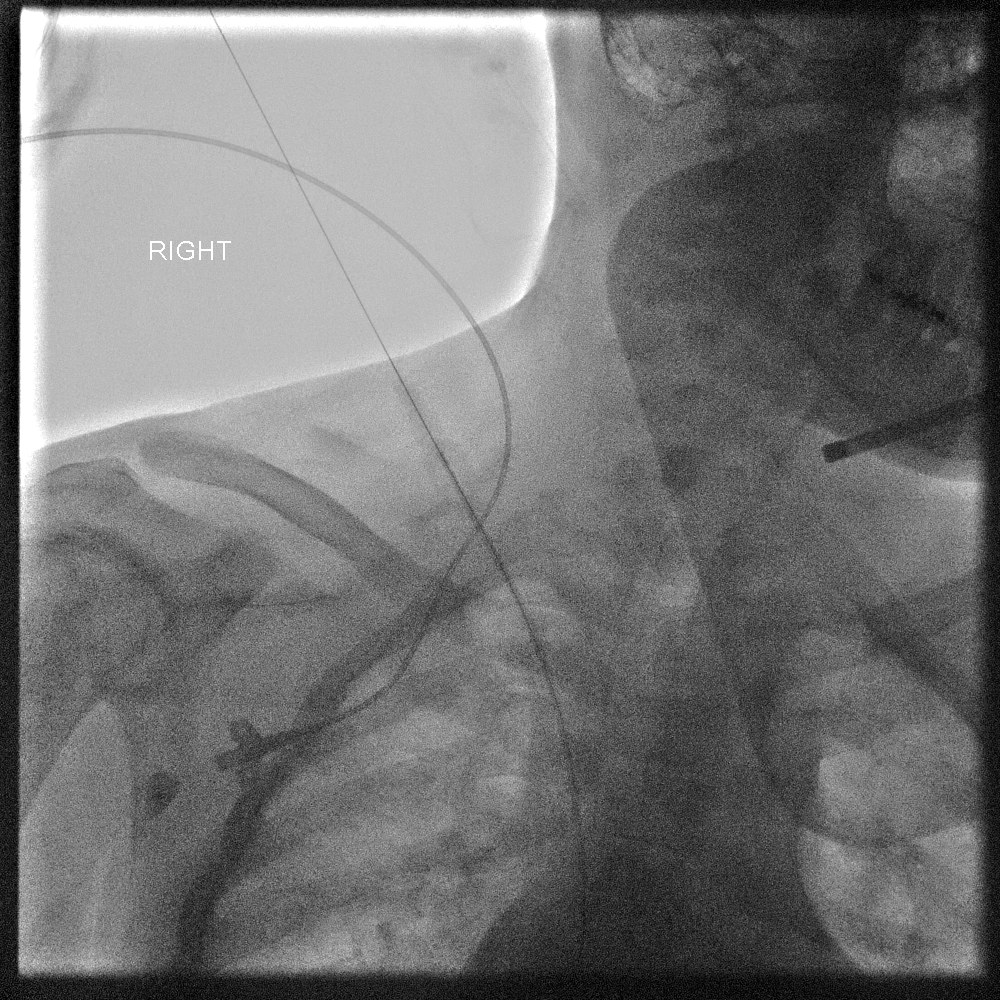
[frame 3/9]
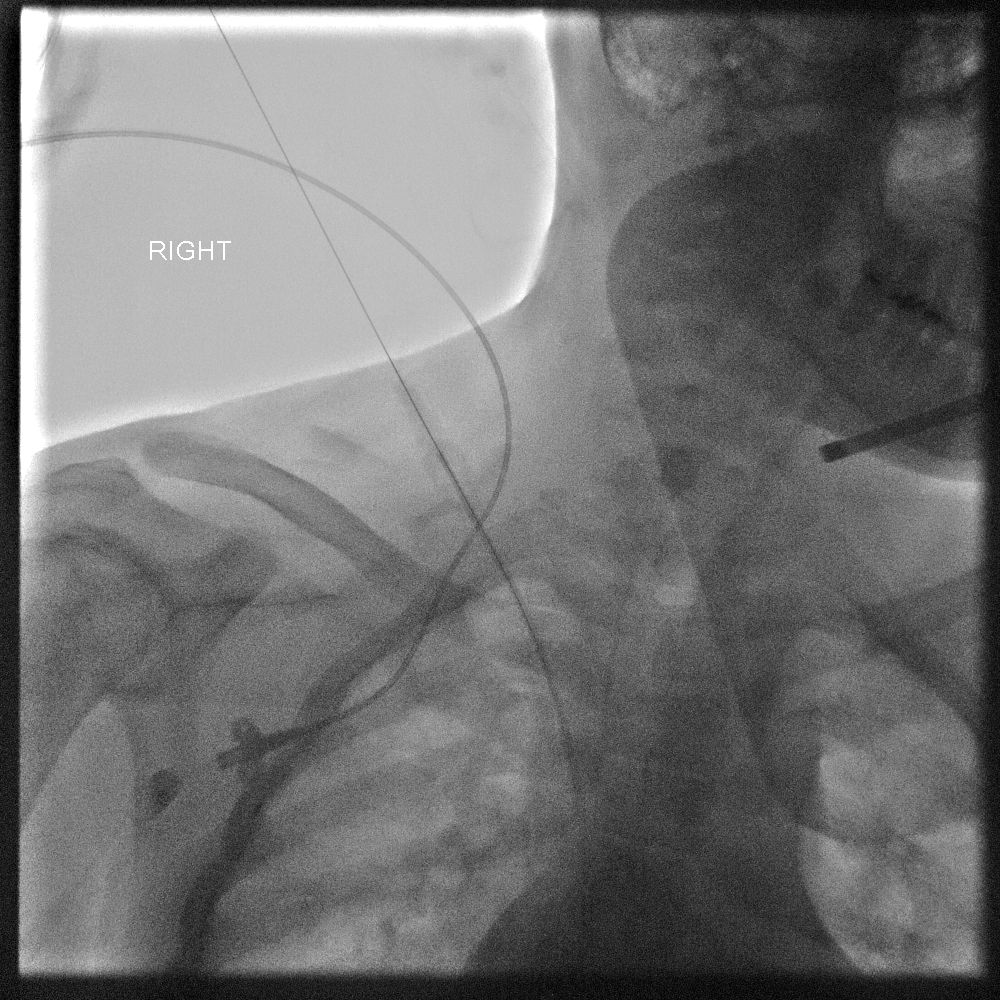
[frame 5/9]
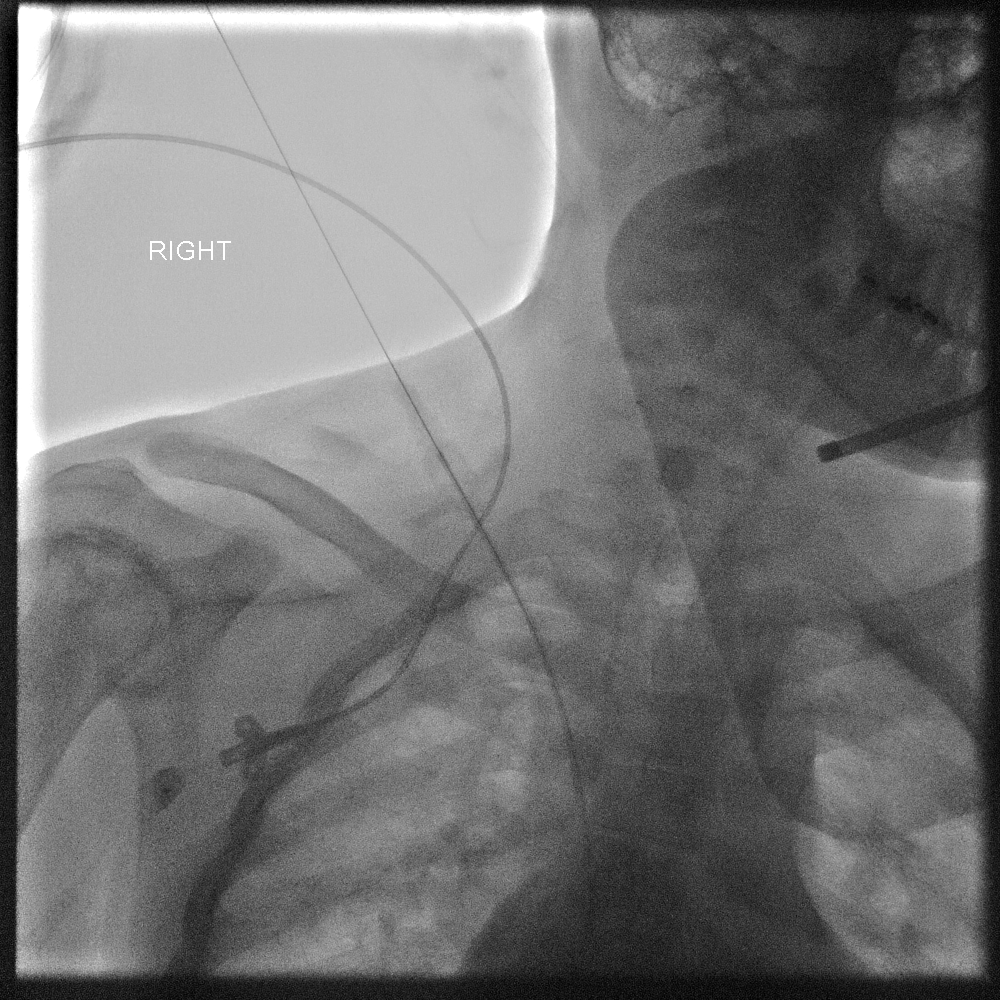
[frame 8/9]
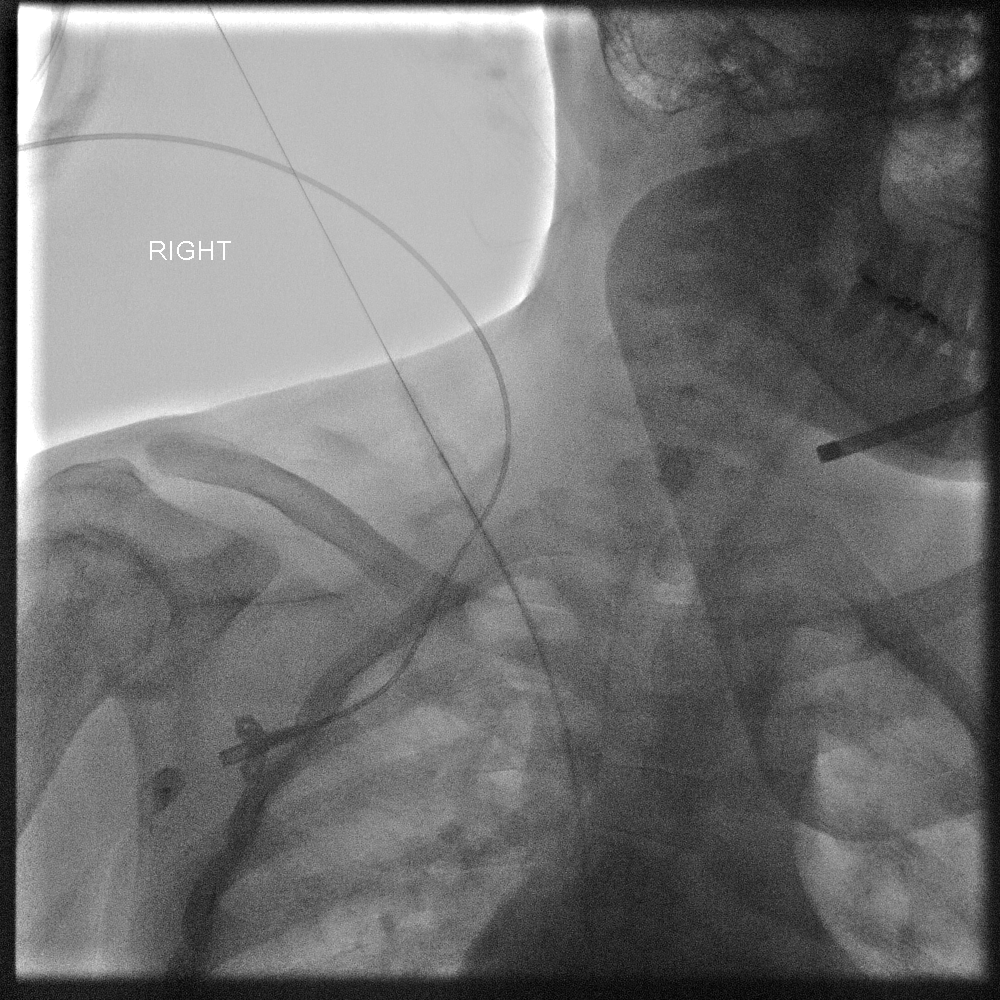

[5 of 5 positions shown; findings below may reference images not displayed]

EXAM:
RIGHT IJ SINGLE-LUMEN TUNNELED POWER PICC LINE

MEDICATIONS:
1% LIDOCAINE LOCAL

ANESTHESIA/SEDATION:
None.

FLUOROSCOPY TIME:  Fluoroscopy Time: 0 minutes 12 seconds (0 mGy).

COMPLICATIONS:
None immediate.

PROCEDURE:
Informed written consent was obtained from the patient after a
thorough discussion of the procedural risks, benefits and
alternatives. All questions were addressed. Maximal Sterile Barrier
Technique was utilized including caps, mask, sterile gowns, sterile
gloves, sterile drape, hand hygiene and skin antiseptic. A timeout
was performed prior to the initiation of the procedure.

Under sterile conditions and local anesthesia, ultrasound
micropuncture access performed of the right internal jugular vein.
Images obtained for documentation of the patent right internal
jugular vein. Guidewire advanced centrally under fluoroscopy.
Peel-away sheath inserted. Measurements obtained for the appropriate
length.

In the right infraclavicular chest, a subcutaneous tunnel was
created under sterile conditions and local anesthesia. The cuff PICC
line was tunneled subcutaneously to the entry site and advanced into
the SVC RA junction through the peel-away sheath. Position confirmed
with fluoroscopy. Images obtained for documentation. Blood aspirated
easily followed by saline flushing. External caps applied. Catheter
secured with Ethilon suture. Entry site closed with Dermabond the
right IJ puncture. Sterile dressing applied. No immediate
complication. Patient tolerated the procedure well.
IMPRESSION: Successful right IJ single-lumen tunneled power PICC line. Tip SVC
RA junction. Ready for use.

## 2022-07-17 ENCOUNTER — Other Ambulatory Visit: Payer: Self-pay | Admitting: Family Medicine

## 2022-07-17 ENCOUNTER — Telehealth: Payer: Self-pay | Admitting: Family Medicine

## 2022-07-17 DIAGNOSIS — D571 Sickle-cell disease without crisis: Secondary | ICD-10-CM

## 2022-07-17 DIAGNOSIS — G894 Chronic pain syndrome: Secondary | ICD-10-CM

## 2022-07-17 MED ORDER — OXYCODONE HCL ER 15 MG PO T12A
15.0000 mg | EXTENDED_RELEASE_TABLET | Freq: Two times a day (BID) | ORAL | 0 refills | Status: DC
Start: 2022-07-22 — End: 2022-08-22

## 2022-07-17 MED ORDER — OXYCODONE HCL 10 MG PO TABS
10.0000 mg | ORAL_TABLET | Freq: Four times a day (QID) | ORAL | 0 refills | Status: DC | PRN
Start: 2022-07-22 — End: 2022-08-05

## 2022-07-17 NOTE — Progress Notes (Signed)
Reviewed PDMP substance reporting system prior to prescribing opiate medications. No inconsistencies noted.  Meds ordered this encounter  Medications   oxyCODONE (OXYCONTIN) 15 mg 12 hr tablet    Sig: Take 1 tablet (15 mg) by mouth every 12 hours.    Dispense:  60 tablet    Refill:  0    Order Specific Question:   Supervising Provider    Answer:   [name], [name] [[id number]]   Oxycodone HCl 10 MG TABS    Sig: Take 1 tablet (10 mg) by mouth every 6 hours as needed for pain.    Dispense:  60 tablet    Refill:  0    Order Specific Question:   Supervising Provider    Answer:   [name], [name] [[id number]]   Justina Bertini Moore Addam Goeller  APRN, MSN, FNP-C [facility] [address]  [place], [place] [address] [phone]  

## 2022-07-19 ENCOUNTER — Other Ambulatory Visit (HOSPITAL_COMMUNITY): Payer: Self-pay

## 2022-07-21 NOTE — Telephone Encounter (Signed)
Caller & Relationship to patient:  MRN #  [id number]   Call Back Number:   Date of Last Office Visit: 07/03/2022     Date of Next Office Visit: 07/25/2022    Medication(s) to be Refilled: Oxy Conrtin  Preferred Pharmacy:   ** Please notify patient to allow 48-72 hours to process** **Let patient know to contact pharmacy at the end of the day to make sure medication is ready. ** **If patient has not been seen in a year or longer, book an appointment **Advise to use MyChart for refill requests OR to contact their pharmacy

## 2022-07-22 ENCOUNTER — Other Ambulatory Visit: Payer: Self-pay | Admitting: Family Medicine

## 2022-07-25 ENCOUNTER — Ambulatory Visit (INDEPENDENT_AMBULATORY_CARE_PROVIDER_SITE_OTHER): Payer: Medicare Other | Admitting: Family Medicine

## 2022-07-25 VITALS — BP 108/72 | HR 107 | Temp 97.1°F | Ht 63.0 in | Wt 123.6 lb

## 2022-07-25 DIAGNOSIS — G894 Chronic pain syndrome: Secondary | ICD-10-CM

## 2022-07-25 DIAGNOSIS — D571 Sickle-cell disease without crisis: Secondary | ICD-10-CM | POA: Diagnosis not present

## 2022-07-25 DIAGNOSIS — E559 Vitamin D deficiency, unspecified: Secondary | ICD-10-CM | POA: Diagnosis not present

## 2022-07-25 DIAGNOSIS — N184 Chronic kidney disease, stage 4 (severe): Secondary | ICD-10-CM | POA: Diagnosis not present
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
## 2022-07-25 NOTE — Progress Notes (Signed)
p 

## 2022-07-27 LAB — SICKLE CELL SCREEN: Sickle Cell Screen: POSITIVE — AB

## 2022-08-03 ENCOUNTER — Telehealth (HOSPITAL_COMMUNITY): Payer: Self-pay

## 2022-08-03 ENCOUNTER — Inpatient Hospital Stay (HOSPITAL_COMMUNITY)
Admission: AD | Admit: 2022-08-03 | Discharge: 2022-08-05 | DRG: 812 | Disposition: A | Discharge status: Home / Self Care | Payer: Medicare Other | Source: Ambulatory Visit | Attending: Internal Medicine | Admitting: Internal Medicine

## 2022-08-03 ENCOUNTER — Other Ambulatory Visit: Payer: Self-pay

## 2022-08-03 ENCOUNTER — Encounter (HOSPITAL_COMMUNITY): Payer: Self-pay | Admitting: Family Medicine

## 2022-08-03 DIAGNOSIS — Z5941 Food insecurity: Secondary | ICD-10-CM | POA: Diagnosis not present

## 2022-08-03 DIAGNOSIS — K219 Gastro-esophageal reflux disease without esophagitis: Secondary | ICD-10-CM | POA: Diagnosis present

## 2022-08-03 DIAGNOSIS — N08 Glomerular disorders in diseases classified elsewhere: Secondary | ICD-10-CM | POA: Diagnosis present

## 2022-08-03 DIAGNOSIS — G894 Chronic pain syndrome: Secondary | ICD-10-CM | POA: Diagnosis not present

## 2022-08-03 DIAGNOSIS — N184 Chronic kidney disease, stage 4 (severe): Secondary | ICD-10-CM | POA: Diagnosis present

## 2022-08-03 DIAGNOSIS — Z9081 Acquired absence of spleen: Secondary | ICD-10-CM | POA: Diagnosis not present

## 2022-08-03 DIAGNOSIS — Z885 Allergy status to narcotic agent status: Secondary | ICD-10-CM | POA: Diagnosis not present

## 2022-08-03 DIAGNOSIS — Z79899 Other long term (current) drug therapy: Secondary | ICD-10-CM | POA: Diagnosis not present

## 2022-08-03 DIAGNOSIS — D5700 Hb-SS disease with crisis, unspecified: Principal | ICD-10-CM | POA: Diagnosis present

## 2022-08-03 DIAGNOSIS — F17210 Nicotine dependence, cigarettes, uncomplicated: Secondary | ICD-10-CM | POA: Diagnosis present

## 2022-08-03 DIAGNOSIS — D571 Sickle-cell disease without crisis: Secondary | ICD-10-CM

## 2022-08-03 DIAGNOSIS — E876 Hypokalemia: Secondary | ICD-10-CM | POA: Diagnosis present

## 2022-08-03 DIAGNOSIS — D638 Anemia in other chronic diseases classified elsewhere: Secondary | ICD-10-CM | POA: Diagnosis present

## 2022-08-03 DIAGNOSIS — Z886 Allergy status to analgesic agent status: Secondary | ICD-10-CM | POA: Diagnosis not present

## 2022-08-03 DIAGNOSIS — Z803 Family history of malignant neoplasm of breast: Secondary | ICD-10-CM | POA: Diagnosis not present

## 2022-08-03 LAB — CBC WITH DIFFERENTIAL/PLATELET
Abs Immature Granulocytes: 0.03 10*3/uL (ref 0.00–0.07)
Basophils Absolute: 0 10*3/uL (ref 0.0–0.1)
Basophils Relative: 0 %
Eosinophils Absolute: 0.1 10*3/uL (ref 0.0–0.5)
Eosinophils Relative: 1 %
HCT: 24.5 % — ABNORMAL LOW (ref 39.0–52.0)
Hemoglobin: 8.7 g/dL — ABNORMAL LOW (ref 13.0–17.0)
Immature Granulocytes: 0 %
Lymphocytes Relative: 43 %
Lymphs Abs: 4.6 10*3/uL — ABNORMAL HIGH (ref 0.7–4.0)
MCH: 40.3 pg — ABNORMAL HIGH (ref 26.0–34.0)
MCHC: 35.5 g/dL (ref 30.0–36.0)
MCV: 113.4 fL — ABNORMAL HIGH (ref 80.0–100.0)
Monocytes Absolute: 0.5 10*3/uL (ref 0.1–1.0)
Monocytes Relative: 4 %
Neutro Abs: 5.4 10*3/uL (ref 1.7–7.7)
Neutrophils Relative %: 52 %
Platelets: 141 10*3/uL — ABNORMAL LOW (ref 150–400)
RBC: 2.16 MIL/uL — ABNORMAL LOW (ref 4.22–5.81)
RDW: 19.4 % — ABNORMAL HIGH (ref 11.5–15.5)
WBC: 10.6 10*3/uL — ABNORMAL HIGH (ref 4.0–10.5)
nRBC: 0.2 % (ref 0.0–0.2)

## 2022-08-03 LAB — COMPREHENSIVE METABOLIC PANEL
ALT: 49 U/L — ABNORMAL HIGH (ref 0–44)
AST: 55 U/L — ABNORMAL HIGH (ref 15–41)
Albumin: 3.5 g/dL (ref 3.5–5.0)
Alkaline Phosphatase: 136 U/L — ABNORMAL HIGH (ref 38–126)
Anion gap: 11 (ref 5–15)
BUN: 36 mg/dL — ABNORMAL HIGH (ref 6–20)
CO2: 25 mmol/L (ref 22–32)
Calcium: 8.7 mg/dL — ABNORMAL LOW (ref 8.9–10.3)
Chloride: 102 mmol/L (ref 98–111)
Creatinine, Ser: 3.16 mg/dL — ABNORMAL HIGH (ref 0.61–1.24)
GFR, Estimated: 25 mL/min — ABNORMAL LOW (ref >60)
Glucose, Bld: 93 mg/dL (ref 70–99)
Potassium: 3.3 mmol/L — ABNORMAL LOW (ref 3.5–5.1)
Sodium: 138 mmol/L (ref 135–145)
Total Bilirubin: 1.4 mg/dL — ABNORMAL HIGH (ref 0.3–1.2)
Total Protein: 7.2 g/dL (ref 6.5–8.1)

## 2022-08-03 LAB — TYPE AND SCREEN
ABO/RH(D): O POS
Antibody Screen: NEGATIVE

## 2022-08-03 LAB — LACTATE DEHYDROGENASE: LDH: 151 U/L (ref 98–192)

## 2022-08-03 MED ORDER — ALLOPURINOL 100 MG PO TABS: 50.0000 mg | ORAL_TABLET | Freq: Every day | ORAL | Status: DC

## 2022-08-03 MED ORDER — HYDROXYUREA 500 MG PO CAPS
500.0000 mg | ORAL_CAPSULE | ORAL | Status: DC
  Administered 2022-08-05: 500 mg via ORAL
  Filled 2022-08-03: qty 1

## 2022-08-03 MED ORDER — COLCHICINE 0.6 MG PO TABS: 0.6000 mg | ORAL_TABLET | Freq: Every day | ORAL | Status: DC

## 2022-08-03 MED ORDER — DEFERIPRONE (TWICE DAILY) 1000 MG PO TABS: 3000.0000 mg | ORAL_TABLET | Freq: Every day | ORAL | Status: DC

## 2022-08-03 MED ORDER — FOLIC ACID 1 MG PO TABS
1.0000 mg | ORAL_TABLET | Freq: Every day | ORAL | Status: DC
  Administered 2022-08-04 – 2022-08-05 (×2): 1 mg via ORAL
  Filled 2022-08-03 (×2): qty 1

## 2022-08-03 MED ORDER — HYDROXYUREA 500 MG PO CAPS
1000.0000 mg | ORAL_CAPSULE | ORAL | Status: DC
  Administered 2022-08-04: 1000 mg via ORAL
  Filled 2022-08-03: qty 2

## 2022-08-03 MED ORDER — HEPARIN SODIUM (PORCINE) 5000 UNIT/ML IJ SOLN
5000.0000 [IU] | Freq: Three times a day (TID) | INTRAMUSCULAR | Status: DC
  Administered 2022-08-03 – 2022-08-04 (×2): 5000 [IU] via SUBCUTANEOUS
  Filled 2022-08-03 (×4): qty 1

## 2022-08-03 MED ORDER — HYDROXYUREA 500 MG PO CAPS: 500.0000 mg | ORAL_CAPSULE | ORAL | Status: DC

## 2022-08-03 MED ORDER — POLYETHYLENE GLYCOL 3350 17 G PO PACK: 17.0000 g | PACK | Freq: Every day | ORAL | Status: DC | PRN

## 2022-08-03 MED ORDER — NALOXONE HCL 0.4 MG/ML IJ SOLN: 0.4000 mg | INTRAMUSCULAR | Status: DC | PRN

## 2022-08-03 MED ORDER — CALCITRIOL 0.25 MCG PO CAPS
0.2500 ug | ORAL_CAPSULE | Freq: Every day | ORAL | Status: DC
  Administered 2022-08-04 – 2022-08-05 (×2): 0.25 ug via ORAL
  Filled 2022-08-03 (×2): qty 1

## 2022-08-03 MED ORDER — VOXELOTOR 500 MG PO TABS: 1500.0000 mg | ORAL_TABLET | Freq: Every day | ORAL | Status: DC

## 2022-08-03 MED ORDER — AMITRIPTYLINE HCL 25 MG PO TABS
50.0000 mg | ORAL_TABLET | Freq: Every day | ORAL | Status: DC
  Administered 2022-08-03 – 2022-08-04 (×2): 50 mg via ORAL
  Filled 2022-08-03 (×2): qty 2

## 2022-08-03 MED ORDER — DIPHENHYDRAMINE HCL 25 MG PO CAPS
25.0000 mg | ORAL_CAPSULE | ORAL | Status: DC | PRN
  Administered 2022-08-03: 25 mg via ORAL
  Filled 2022-08-03: qty 1

## 2022-08-03 MED ORDER — ONDANSETRON HCL 4 MG/2ML IJ SOLN
4.0000 mg | Freq: Four times a day (QID) | INTRAMUSCULAR | Status: DC | PRN
  Administered 2022-08-03: 4 mg via INTRAVENOUS
  Filled 2022-08-03: qty 2

## 2022-08-03 MED ORDER — SODIUM BICARBONATE 650 MG PO TABS
1950.0000 mg | ORAL_TABLET | Freq: Every day | ORAL | Status: DC
  Administered 2022-08-04: 1950 mg via ORAL
  Filled 2022-08-03: qty 3

## 2022-08-03 MED ORDER — SENNOSIDES-DOCUSATE SODIUM 8.6-50 MG PO TABS
1.0000 | ORAL_TABLET | Freq: Two times a day (BID) | ORAL | Status: DC
  Administered 2022-08-03: 1 via ORAL
  Filled 2022-08-03 (×4): qty 1

## 2022-08-03 MED ORDER — HYDROMORPHONE 1 MG/ML IV SOLN
INTRAVENOUS | Status: DC
  Administered 2022-08-03: 6 mg via INTRAVENOUS
  Administered 2022-08-03: 5.5 mg via INTRAVENOUS
  Administered 2022-08-03: 30 mg via INTRAVENOUS
  Administered 2022-08-04: 3 mg via INTRAVENOUS
  Administered 2022-08-04: 2.5 mg via INTRAVENOUS
  Administered 2022-08-04: 30 mg via INTRAVENOUS
  Administered 2022-08-04: 2 mg via INTRAVENOUS
  Administered 2022-08-04: 1.5 mg via INTRAVENOUS
  Administered 2022-08-04: 3 mg via INTRAVENOUS
  Administered 2022-08-04: 5.5 mg via INTRAVENOUS
  Administered 2022-08-05: 2.5 mg via INTRAVENOUS
  Administered 2022-08-05: 1 mg via INTRAVENOUS
  Administered 2022-08-05: 3.5 mg via INTRAVENOUS
  Administered 2022-08-05: 3 mg via INTRAVENOUS
  Filled 2022-08-03 (×2): qty 30

## 2022-08-03 MED ORDER — SODIUM CHLORIDE 0.9% FLUSH: 9.0000 mL | INTRAVENOUS | Status: DC | PRN

## 2022-08-03 MED ORDER — SODIUM CHLORIDE 0.45 % IV SOLN: INTRAVENOUS | Status: DC

## 2022-08-03 MED ORDER — ACETAMINOPHEN 500 MG PO TABS
1000.0000 mg | ORAL_TABLET | Freq: Once | ORAL | Status: AC
  Administered 2022-08-03: 1000 mg via ORAL
  Filled 2022-08-03: qty 2

## 2022-08-03 MED ORDER — OXYCODONE HCL ER 15 MG PO T12A
15.0000 mg | EXTENDED_RELEASE_TABLET | Freq: Two times a day (BID) | ORAL | Status: DC
  Administered 2022-08-03 – 2022-08-05 (×4): 15 mg via ORAL
  Filled 2022-08-03 (×4): qty 1

## 2022-08-03 NOTE — Progress Notes (Signed)
Pt admitted to day hospital today for sickle cell pain treatment. On arrival, pt rates 10/10 generalized pain. Pt received Dilaudid PCA, IV Zofran, and hydrated with IV fluids via PIV. Pt also received PO Tylenol and Benadryl. Due to ongoing pain pt is being admitted to [address]  by [place] Hollis, FNP. AT time of transfer, pt rates pain 8/10. PCA settings verified with Danella Maiers, RN. Report given to [place], [place]. PIV remains C/D/I and infusing PCA and IV fluids at time of transfer. Pt is alert, oriented, and transferred to [address], bed18 in wheelchair by RN.

## 2022-08-03 NOTE — H&P (Signed)
[facility] History and Physical   Date: 08/03/2022  Patient name: [name] [name]. Medical record number: [id number] Date of birth: 1980/09/18 Age: 42 y.o. Gender: male PCP: Massie Maroon, FNP  Attending physician: Quentin Angst, MD  Chief Complaint: Sickle cell pain   History of Present Illness: Mustaf Foutch is a 42 year old male with a medical history significant for sickle cell disease, chronic pain syndrome, opiate dependence and tolerance, stage IV kidney disease, and anemia of chronic disease presents with complaints of pain to low back and lower extremities consistent with his typical sickle cell crisis.  Patient states the pain intensity increased 3 days ago and has been unrelieved by his home medications.  He also reports some vomiting.  Currently, pain intensity is 8/10.  Patient last had oxycodone and OxyContin this a.m. without very much relief.  He currently denies headache, dizziness, chest pain, or shortness of breath.  No urinary symptoms, nausea, vomiting, or diarrhea.  No sick contacts or recent travel.  Sickle cell center course: Vital signs show:BP 110/73 (BP Location: Right Arm)   Pulse 77   Temp 97.8 F (36.6 C) (Temporal)   Resp 14   SpO2 96%  Comprehensive metabolic panel notable for potassium 3.3, BUN 36, creatinine 3.16, alkaline phosphatase 136, AST 55, ALT 49, and total bilirubin 1.4.  Complete blood count shows WBCs 10.6, hemoglobin 8.7, and platelets 141,000. Pain persists despite IV fluids, Tylenol, and IV dilaudid PCA. Patient will be admitted to [facility] for further management of sickle cell pain crisis.  Meds: Medications Prior to Admission  Medication Sig Dispense Refill Last Dose   allopurinol (ZYLOPRIM) 100 MG tablet Take 0.5 tablets (50 mg total) by mouth daily. 30 tablet 0 Past Month   amitriptyline (ELAVIL) 25 MG tablet Take 50 mg by mouth at bedtime.   Past Month   Deferiprone, Twice Daily,  (FERRIPROX TWICE-A-DAY) 1000 MG TABS Take 3,000 mg by mouth daily. 180 tablet 0 08/03/2022   folic acid (FOLVITE) 1 MG tablet TAKE 1 TABLET BY MOUTH EVERY DAY (Patient taking differently: Take 1 mg by mouth every morning.) 90 tablet 4 08/03/2022   hydroxyurea (HYDREA) 500 MG capsule Take 1 capsule by mouth in the morning on Sun/Tues/Wed/Thurs/Sat and 2 capsules on Mon/Fri (Patient taking differently: Take 500-1,000 mg by mouth See admin instructions. Take 500 mg by mouth in the morning on Sun/Tues/Wed/Thurs/Sat and 1,000 mg on Mon/Fri) 260 capsule 1 08/03/2022   oxyCODONE (OXYCONTIN) 15 mg 12 hr tablet Take 1 tablet (15 mg) by mouth every 12 hours. 60 tablet 0 08/03/2022   Oxycodone HCl 10 MG TABS Take 1 tablet (10 mg) by mouth every 6 hours as needed for pain. 60 tablet 0 08/03/2022   sodium bicarbonate 650 MG tablet Take 1,950 mg by mouth daily.   08/03/2022   TYLENOL 8 HOUR ARTHRITIS PAIN 650 MG CR tablet Take 650 mg by mouth every 8 (eight) hours as needed for pain.   Past Month   VELTASSA 16.8 g PACK Take 16.8 g by mouth daily.   08/03/2022   voxelotor (OXBRYTA) 500 MG TABS tablet Take 1,500 mg by mouth daily with breakfast.   08/03/2022   calcitRIOL (ROCALTROL) 0.25 MCG capsule Take 0.25 mcg by mouth daily with breakfast.   More than a month   colchicine 0.6 MG tablet Take 0.5 tablets (0.3 mg total) by mouth daily. 15 tablet 0 More than a month    Allergies: Nsaids and Morphine  and codeine Past Medical History:  Diagnosis Date   Abscess of right iliac fossa 09/24/2018   required Perc Drain 09/24/2018   Arachnoid Cyst of brain bilaterally    "2 really small ones in the back of my head; inside; saw them w/MRI" (09/25/2012)   Bacterial pneumonia ~ 2012   "caught it here in the hospital" (09/25/2012)   Chronic kidney disease    "from my sickle cell" (09/25/2012)   CKD (chronic kidney disease), stage II    Colitis 04/19/2017   CT scan abd/ pelvis   GERD (gastroesophageal reflux disease)    "after I  eat alot of spicey foods" (09/25/2012)   Gynecomastia, male 07/10/2012   History of blood transfusion    "always related to sickle cell crisis" (09/25/2012)   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   Noted in noted from Hematology notes at [facility]   Migraines    "take RX qd to prevent them" (09/25/2012)   Opioid dependence with withdrawal (HCC) 08/30/2012   Renal insufficiency    Sickle cell anemia (HCC)    Sickle cell crisis (HCC) 09/25/2012   Sickle cell nephropathy (HCC) 07/10/2012   Sinus tachycardia    Tachycardia with heart rate 121-140 beats per minute with ambulation 08/04/2016   Past Surgical History:  Procedure Laterality Date   ABCESS DRAINAGE     CHOLECYSTECTOMY  ~ 2012   COLONOSCOPY N/A 04/23/2017   Procedure: COLONOSCOPY;  Surgeon: Hilarie Fredrickson, MD;  Location: [facility] ENDOSCOPY;  Service: Endoscopy;  Laterality: N/A;   IR FLUORO GUIDE CV LINE RIGHT  12/17/2016   IR FLUORO GUIDE CV LINE RIGHT  02/07/2021   IR RADIOLOGIST EVAL & MGMT  10/02/2018   IR RADIOLOGIST EVAL & MGMT  10/15/2018   IR REMOVAL TUN CV CATH W/O FL  12/21/2016   IR REMOVAL TUN CV CATH W/O FL  02/11/2021   IR US GUIDE VASC ACCESS RIGHT  12/17/2016   IR US GUIDE VASC ACCESS RIGHT  02/07/2021   spleenectomy     Family History  Problem Relation Age of Onset   Breast cancer Mother    Social History   Socioeconomic History   Marital status: Single    Spouse name: Not on file   Number of children: Not on file   Years of education: Not on file   Highest education level: Not on file  Occupational History   Not on file  Tobacco Use   Smoking status: Some Days    Packs/day: .1    Types: Cigarettes   Smokeless tobacco: Never  Vaping Use   Vaping Use: Never used  Substance and Sexual Activity   Alcohol use: Yes    Comment: Once a month   Drug use: Yes    Types: Marijuana    Comment: Every 2-3 weeks    Sexual activity: Yes    Birth control/protection: Condom  Other Topics Concern   Not on  file  Social History Narrative    Lives alone in [place].   Back at school, studying Patent attorney. Unemployed.    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    Social Determinants of Health   Financial Resource Strain: Low Risk  (02/09/2022)   Overall Financial Resource Strain (CARDIA)    Difficulty of Paying Living Expenses: Not hard at all  Food Insecurity: Food Insecurity Present (  06/27/2022)   Hunger Vital Sign    Worried About Running Out of Food in the Last Year: Sometimes true    Ran Out of Food in the Last Year: Sometimes true  Transportation Needs: No Transportation Needs (06/27/2022)   PRAPARE - Administrator, Civil Service (Medical): No    Lack of Transportation (Non-Medical): No  Physical Activity: Sufficiently Active (02/09/2022)   Exercise Vital Sign    Days of Exercise per Week: 7 days    Minutes of Exercise per Session: 30 min  Stress: No Stress Concern Present (02/09/2022)   [employer] of Occupational Health - Occupational Stress Questionnaire    Feeling of Stress : Not at all  Social Connections: Socially Isolated (02/09/2022)   Social Connection and Isolation Panel [NHANES]    Frequency of Communication with Friends and Family: More than three times a week    Frequency of Social Gatherings with Friends and Family: Once a week    Attends Religious Services: Never    Database administrator or Organizations: No    Attends Banker Meetings: Never    Marital Status: Divorced  Catering manager Violence: Not At Risk (06/27/2022)   Humiliation, Afraid, Rape, and Kick questionnaire    Fear of Current or Ex-Partner: No    Emotionally Abused: No    Physically Abused: No    Sexually Abused: No   Review of Systems  Constitutional: Negative.   HENT: Negative.    Respiratory: Negative.     Gastrointestinal: Negative.   Genitourinary: Negative.   Musculoskeletal:  Positive for back pain and joint pain.  Skin: Negative.   Neurological: Negative.   Psychiatric/Behavioral: Negative.      Physical Exam: Blood pressure 115/75, pulse (!) 101, temperature 97.8 F (36.6 C), temperature source Temporal, resp. rate 16, SpO2 100 %. Physical Exam Constitutional:      Appearance: Normal appearance.  Eyes:     Pupils: Pupils are equal, round, and reactive to light.  Cardiovascular:     Rate and Rhythm: Normal rate and regular rhythm.  Pulmonary:     Effort: Pulmonary effort is normal.  Abdominal:     General: Bowel sounds are normal.  Musculoskeletal:        General: Normal range of motion.  Skin:    General: Skin is warm.  Neurological:     General: No focal deficit present.     Mental Status: He is alert. Mental status is at baseline.  Psychiatric:        Mood and Affect: Mood normal.        Behavior: Behavior normal.        Thought Content: Thought content normal.        Judgment: Judgment normal.      Lab results: Results for orders placed or performed during the hospital encounter of 08/03/22 (from the past 24 hour(s))  CBC with Differential/Platelet     Status: Abnormal   Collection Time: 08/03/22  9:45 AM  Result Value Ref Range   WBC 10.6 (H) 4.0 - 10.5 K/uL   RBC 2.16 (L) 4.22 - 5.81 MIL/uL   Hemoglobin 8.7 (L) 13.0 - 17.0 g/dL   HCT 16.1 (L) 09.6 - 04.5 %   MCV 113.4 (H) 80.0 - 100.0 fL   MCH 40.3 (H) 26.0 - 34.0 pg   MCHC 35.5 30.0 - 36.0 g/dL   RDW 40.9 (H) 81.1 - 91.4 %   Platelets 141 (L) 150 - 400 K/uL  nRBC 0.2 0.0 - 0.2 %   Neutrophils Relative % 52 %   Neutro Abs 5.4 1.7 - 7.7 K/uL   Lymphocytes Relative 43 %   Lymphs Abs 4.6 (H) 0.7 - 4.0 K/uL   Monocytes Relative 4 %   Monocytes Absolute 0.5 0.1 - 1.0 K/uL   Eosinophils Relative 1 %   Eosinophils Absolute 0.1 0.0 - 0.5 K/uL   Basophils Relative 0 %   Basophils Absolute 0.0 0.0 - 0.1  K/uL   Immature Granulocytes 0 %   Abs Immature Granulocytes 0.03 0.00 - 0.07 K/uL   Carollee Massed Bodies PRESENT    Polychromasia PRESENT    Sickle Cells MARKED    Target Cells PRESENT   Lactate dehydrogenase     Status: None   Collection Time: 08/03/22  9:45 AM  Result Value Ref Range   LDH 151 98 - 192 U/L  Comprehensive metabolic panel     Status: Abnormal   Collection Time: 08/03/22  9:45 AM  Result Value Ref Range   Sodium 138 135 - 145 mmol/L   Potassium 3.3 (L) 3.5 - 5.1 mmol/L   Chloride 102 98 - 111 mmol/L   CO2 25 22 - 32 mmol/L   Glucose, Bld 93 70 - 99 mg/dL   BUN 36 (H) 6 - 20 mg/dL   Creatinine, Ser 1.61 (H) 0.61 - 1.24 mg/dL   Calcium 8.7 (L) 8.9 - 10.3 mg/dL   Total Protein 7.2 6.5 - 8.1 g/dL   Albumin 3.5 3.5 - 5.0 g/dL   AST 55 (H) 15 - 41 U/L   ALT 49 (H) 0 - 44 U/L   Alkaline Phosphatase 136 (H) 38 - 126 U/L   Total Bilirubin 1.4 (H) 0.3 - 1.2 mg/dL   GFR, Estimated 25 (L) >60 mL/min   Anion gap 11 5 - 15   *Note: Due to a large number of results and/or encounters for the requested time period, some results have not been displayed. A complete set of results can be found in Results Review.    Imaging results:  No results found.   Assessment & Plan: Sickle cell pain crisis:  Patient admitted to sickle cell day infusion center for management of pain crisis, pain intensity did not improve. He will be admitted to [facility] for further management of sickle cell pain crisis.  Patient is opiate tolerant, will continue IV dilaudid PCA.  Continue IV fluids,  0.45% saline at 100 ml/hr Monitor vital signs closely, reevaluate pain scale regularly, and supplemental oxygen as needed.   Anemia of chronic disease:  Hemoglobin is 8.7. There is no clinical indication for blood transfusion at this time. Repeat CBC in am. Will consider transfusing if hemoglobin is less than 7.   Chronic pain syndrome:  Continue home pain medication  Chronic kidney disease  stage IV:  Creatinine consistent with patient's baseline. Avoid nephrotoxins. Gentle hydration. Labs in AM.  Hypokalemia: Potassium slightly decreased.  Hold Veltassa. Continue to follow labs.    [name]  APRN, MSN, FNP-C Patient Care [facility] [address] [place], [place] [address] [phone]  08/03/2022, 10:58 AM

## 2022-08-03 NOTE — Telephone Encounter (Signed)
Patient called day hospital wanting to come in today for sickle cell pain treatment. Pt rates 10/10 generalized pain. Pt reports taking Oxycodone 10 mg and OxyContin 15 mg at 7 AM. Pt denies being to ER recently. Pt screened for COVID and denies symptoms and exposures. Pt denies fever, chest pain, N/V/D, abdominal pain, and priapism. [place] Hollis, FNP notified and she said that pt can come to day hospital today. Pt notified and verbalized understanding. Pt states that his mom will be his transportation today.

## 2022-08-04 DIAGNOSIS — D5700 Hb-SS disease with crisis, unspecified: Secondary | ICD-10-CM | POA: Diagnosis not present

## 2022-08-04 LAB — BASIC METABOLIC PANEL
Anion gap: 6 (ref 5–15)
BUN: 29 mg/dL — ABNORMAL HIGH (ref 6–20)
CO2: 21 mmol/L — ABNORMAL LOW (ref 22–32)
Calcium: 8.2 mg/dL — ABNORMAL LOW (ref 8.9–10.3)
Chloride: 105 mmol/L (ref 98–111)
Creatinine, Ser: 2.59 mg/dL — ABNORMAL HIGH (ref 0.61–1.24)
GFR, Estimated: 31 mL/min — ABNORMAL LOW (ref >60)
Glucose, Bld: 94 mg/dL (ref 70–99)
Potassium: 4.1 mmol/L (ref 3.5–5.1)
Sodium: 132 mmol/L — ABNORMAL LOW (ref 135–145)

## 2022-08-04 LAB — CBC
HCT: 23.7 % — ABNORMAL LOW (ref 39.0–52.0)
Hemoglobin: 8.1 g/dL — ABNORMAL LOW (ref 13.0–17.0)
MCH: 40.3 pg — ABNORMAL HIGH (ref 26.0–34.0)
MCHC: 34.2 g/dL (ref 30.0–36.0)
MCV: 117.9 fL — ABNORMAL HIGH (ref 80.0–100.0)
Platelets: 124 10*3/uL — ABNORMAL LOW (ref 150–400)
RBC: 2.01 MIL/uL — ABNORMAL LOW (ref 4.22–5.81)
RDW: 19.2 % — ABNORMAL HIGH (ref 11.5–15.5)
WBC: 11.5 10*3/uL — ABNORMAL HIGH (ref 4.0–10.5)
nRBC: 0 % (ref 0.0–0.2)

## 2022-08-04 LAB — RETICULOCYTES
Immature Retic Fract: 25.6 % — ABNORMAL HIGH (ref 2.3–15.9)
RBC.: 1.98 MIL/uL — ABNORMAL LOW (ref 4.22–5.81)
Retic Count, Absolute: 109.9 10*3/uL (ref 19.0–186.0)
Retic Ct Pct: 5.6 % — ABNORMAL HIGH (ref 0.4–3.1)

## 2022-08-04 MED ORDER — SODIUM BICARBONATE 650 MG PO TABS
1950.0000 mg | ORAL_TABLET | Freq: Three times a day (TID) | ORAL | Status: DC
  Administered 2022-08-04 – 2022-08-05 (×3): 1950 mg via ORAL
  Filled 2022-08-04 (×3): qty 3

## 2022-08-04 NOTE — Progress Notes (Signed)
Subjective: [name] [name] is a 42 year old male with a medical history significant for sickle cell disease, chronic pain syndrome, opiate dependence and tolerance, stage IV chronic kidney disease, and anemia of chronic disease was admitted for sickle cell crisis. Patient states that pain intensity has improved some overnight.  He rates his pain as 7/10 primarily to his lower extremities. Patient denies any shortness of breath, dizziness, headache, chest pain, urinary symptoms, nausea, vomiting, or diarrhea. Objective:  Vital signs in last 24 hours:  Vitals:   08/04/22 1005 08/04/22 1250 08/04/22 1423 08/04/22 1517  BP: 98/67     Pulse:      Resp:  14 14 14   Temp:      TempSrc:      SpO2:  98% 98% 98%  Weight:      Height:        Intake/Output from previous day:   Intake/Output Summary (Last 24 hours) at 08/04/2022 1536 Last data filed at 08/04/2022 0700 Gross per 24 hour  Intake 630.86 ml  Output 550 ml  Net 80.86 ml    Physical Exam: General: Alert, awake, oriented x3, in no acute distress.  HEENT: [place]/AT PEERL, EOMI Neck: Trachea midline,  no masses, no thyromegal,y no JVD, no carotid bruit OROPHARYNX:  Moist, No exudate/ erythema/lesions.  Heart: Regular rate and rhythm, without murmurs, rubs, gallops, PMI non-displaced, no heaves or thrills on palpation.  Lungs: Clear to auscultation, no wheezing or rhonchi noted. No increased vocal fremitus resonant to percussion  Abdomen: Soft, nontender, nondistended, positive bowel sounds, no masses no hepatosplenomegaly noted..  Neuro: No focal neurological deficits noted cranial nerves II through XII grossly intact. DTRs 2+ bilaterally upper and lower extremities. Strength 5 out of 5 in bilateral upper and lower extremities. Musculoskeletal: No warm swelling or erythema around joints, no spinal tenderness noted. Psychiatric: Patient alert and oriented x3, good insight and cognition, good recent to remote recall. Lymph node survey:  No cervical axillary or inguinal lymphadenopathy noted.  Lab Results:  Basic Metabolic Panel:    Component Value Date/Time   NA 132 (L) 08/04/2022 1026   NA 137 01/24/2022 1224   K 4.1 08/04/2022 1026   CL 105 08/04/2022 1026   CO2 21 (L) 08/04/2022 1026   BUN 29 (H) 08/04/2022 1026   BUN 35 (H) 01/24/2022 1224   CREATININE 2.59 (H) 08/04/2022 1026   CREATININE 1.45 (H) 05/12/2014 1430   GLUCOSE 94 08/04/2022 1026   CALCIUM 8.2 (L) 08/04/2022 1026   CBC:    Component Value Date/Time   WBC 11.5 (H) 08/04/2022 0624   HGB 8.1 (L) 08/04/2022 0624   HGB 9.2 (L) 10/25/2021 1125   HCT 23.7 (L) 08/04/2022 0624   HCT 25.1 (L) 10/25/2021 1125   PLT 124 (L) 08/04/2022 0624   PLT 194 10/25/2021 1125   MCV 117.9 (H) 08/04/2022 0624   MCV 107 (H) 10/25/2021 1125   NEUTROABS 5.4 08/03/2022 0945   NEUTROABS 6.6 10/25/2021 1125   LYMPHSABS 4.6 (H) 08/03/2022 0945   LYMPHSABS 5.7 (H) 10/25/2021 1125   MONOABS 0.5 08/03/2022 0945   EOSABS 0.1 08/03/2022 0945   EOSABS 0.4 10/25/2021 1125   BASOSABS 0.0 08/03/2022 0945   BASOSABS 0.1 10/25/2021 1125    No results found for this or any previous visit (from the past 240 hour(s)).  Studies/Results: No results found.  Medications: Scheduled Meds:  amitriptyline  50 mg Oral QHS   calcitRIOL  0.25 mcg Oral Q breakfast   Deferiprone (Twice Daily)  3,000 mg Oral Daily   folic acid  1 mg Oral Daily   heparin  5,000 Units Subcutaneous Q8H   HYDROmorphone   Intravenous Q4H   [START ON 08/05/2022] hydroxyurea  500 mg Oral Once per day on Sun Tue Wed Thu Sat   And   hydroxyurea  1,000 mg Oral Once per day on Mon Fri   oxyCODONE  15 mg Oral Q12H   senna-docusate  1 tablet Oral BID   sodium bicarbonate  1,950 mg Oral TID   voxelotor  1,500 mg Oral Q breakfast   Continuous Infusions:  sodium chloride 10 mL/hr at 08/04/22 1251   PRN Meds:.diphenhydrAMINE, naloxone **AND** sodium chloride flush, ondansetron (ZOFRAN) IV, polyethylene  glycol  Consultants: none  Procedures: none  Antibiotics: none  Assessment/Plan: Principal Problem:   Sickle cell pain crisis (HCC) Active Problems:   CKD (chronic kidney disease), stage IV (HCC)   Chronic pain syndrome   Sickle cell crisis (HCC)  Sickle cell disease with pain crisis: Weaning IV Dilaudid PCA Restart oxycodone 10 mg every 6 hours as needed for severe breakthrough pain OxyContin 15 mg every 12 hours Decrease IV fluids to [facility] Monitor vital signs very closely, reevaluate pain scale regularly, and supplemental oxygen as needed  Stage IV chronic kidney disease: Creatinine has improved from baseline.  Avoid all nephrotoxins.  Monitor closely.  He is followed by nephrology as an outpatient  Chronic pain syndrome: Continue home medications  Anemia of chronic disease: Patient's hemoglobin is stable and consistent with his baseline.  There is no clinical indication for blood transfusion at this time.  Monitor closely. Code Status: Full Code Family Communication: N/A Disposition Plan: Not yet ready for discharge  Zelpha Messing Rennis Petty  APRN, MSN, FNP-C Patient Care [facility] [address] [place], [place] [address] [phone]    If 7PM-7AM, please contact night-coverage.  08/04/2022, 3:36 PM  LOS: 1 day

## 2022-08-04 NOTE — Plan of Care (Signed)

## 2022-08-05 DIAGNOSIS — G894 Chronic pain syndrome: Secondary | ICD-10-CM

## 2022-08-05 DIAGNOSIS — D5700 Hb-SS disease with crisis, unspecified: Secondary | ICD-10-CM | POA: Diagnosis not present

## 2022-08-05 DIAGNOSIS — D571 Sickle-cell disease without crisis: Secondary | ICD-10-CM

## 2022-08-05 LAB — BASIC METABOLIC PANEL
Anion gap: 9 (ref 5–15)
BUN: 26 mg/dL — ABNORMAL HIGH (ref 6–20)
CO2: 22 mmol/L (ref 22–32)
Calcium: 8.4 mg/dL — ABNORMAL LOW (ref 8.9–10.3)
Chloride: 105 mmol/L (ref 98–111)
Creatinine, Ser: 2.44 mg/dL — ABNORMAL HIGH (ref 0.61–1.24)
GFR, Estimated: 34 mL/min — ABNORMAL LOW (ref >60)
Glucose, Bld: 87 mg/dL (ref 70–99)
Potassium: 4.1 mmol/L (ref 3.5–5.1)
Sodium: 136 mmol/L (ref 135–145)

## 2022-08-05 LAB — CBC
HCT: 26.1 % — ABNORMAL LOW (ref 39.0–52.0)
Hemoglobin: 8.9 g/dL — ABNORMAL LOW (ref 13.0–17.0)
MCH: 39.9 pg — ABNORMAL HIGH (ref 26.0–34.0)
MCHC: 34.1 g/dL (ref 30.0–36.0)
MCV: 117 fL — ABNORMAL HIGH (ref 80.0–100.0)
Platelets: 124 10*3/uL — ABNORMAL LOW (ref 150–400)
RBC: 2.23 MIL/uL — ABNORMAL LOW (ref 4.22–5.81)
RDW: 19.4 % — ABNORMAL HIGH (ref 11.5–15.5)
WBC: 9.3 10*3/uL (ref 4.0–10.5)
nRBC: 0 % (ref 0.0–0.2)

## 2022-08-05 MED ORDER — OXYCODONE HCL 10 MG PO TABS
10.0000 mg | ORAL_TABLET | Freq: Four times a day (QID) | ORAL | 0 refills | Status: AC | PRN
Start: 2022-08-05 — End: 2022-08-20

## 2022-08-05 NOTE — Discharge Summary (Signed)
Physician Discharge Summary  [name] [name]. ZOX:[id number] DOB: September 01, 1980 DOA: 08/03/2022  PCP: Massie Maroon, FNP  Admit date: 08/03/2022  Discharge date: 08/05/2022  Discharge Diagnoses:  Principal Problem:   Sickle cell pain crisis (HCC) Active Problems:   CKD (chronic kidney disease), stage IV (HCC)   Chronic pain syndrome   Sickle cell crisis [facility])   Discharge Condition: Stable  Disposition:   Follow-up Information     Massie Maroon, FNP. Schedule an appointment as soon as possible for a visit in 1 week(s).   Specialty: Family Medicine Contact information: [address] [place] [address] [phone]                Pt is discharged home in good condition and is to follow up with Massie Maroon, FNP this week to have labs evaluated. [name] A Rufer Jr. is instructed to increase activity slowly and balance with rest for the next few days, and use prescribed medication to complete treatment of pain  Diet: Regular Wt Readings from Last 3 Encounters:  08/03/22 53.8 kg  07/25/22 56.1 kg  06/27/22 54 kg    History of present illness:  [name] [name] is a 42 year old male with a medical history significant for sickle cell disease, chronic pain syndrome, opiate dependence and tolerance, stage IV kidney disease, and anemia of chronic disease presents with complaints of pain to low back and lower extremities consistent with his typical sickle cell crisis.  Patient states the pain intensity increased 3 days ago and has been unrelieved by his home medications.  He also reports some vomiting.  Currently, pain intensity is 8/10.  Patient last had oxycodone and OxyContin this a.m. without very much relief.  He currently denies headache, dizziness, chest pain, or shortness of breath.  No urinary symptoms, nausea, vomiting, or diarrhea.  No sick contacts or recent travel.   Sickle cell center course: Vital signs show:BP 110/73 (BP Location:  Right Arm)   Pulse 77   Temp 97.8 F (36.6 C) (Temporal)   Resp 14   SpO2 96%  Comprehensive metabolic panel notable for potassium 3.3, BUN 36, creatinine 3.16, alkaline phosphatase 136, AST 55, ALT 49, and total bilirubin 1.4.  Complete blood count shows WBCs 10.6, hemoglobin 8.7, and platelets 141,000. Pain persists despite IV fluids, Tylenol, and IV dilaudid PCA. Patient will be admitted to [facility] for further management of sickle cell pain crisis.   Hospital Course:  Patient was admitted for sickle cell pain crisis and managed appropriately with IVF, IV Dilaudid via PCA as well as other adjunct therapies per sickle cell pain management protocols.  IV Toradol is contraindicated due to CKD.  Patient responded to gentle hydration and management of pain with above regimen.  Hemoglobin remained stable at baseline, he did not require any blood transfusion during this admission.  His potassium was slightly below normal which was repleted.  He was continued his home pain medications while IV Dilaudid via PCA was weaned off successfully.  As at today, pain has returned to baseline, patient now tolerating p.o. intake with no restrictions and ambulating well with no significant pain.  Serum creatinine has returned to baseline as well.  Patient requested to be discharged home. Patient was therefore discharged home today in a hemodynamically stable condition.   [name] [name] will follow-up with PCP within 1 week of this discharge. [name] [name] was counseled extensively about nonpharmacologic means of pain management, patient verbalized understanding and was  appreciative of  the care received during this admission.   We discussed the need for good hydration, monitoring of hydration status, avoidance of heat, cold, stress, and infection triggers. We discussed the need to be adherent with taking Hydrea and other home medications. Patient was reminded of the need to seek medical attention immediately if any  symptom of bleeding, anemia, or infection occurs.  Discharge Exam: Vitals:   08/05/22 0622 08/05/22 1041  BP: 105/64 98/64  Pulse: 74 90  Resp: 18 20  Temp: 98.3 F (36.8 C) 98.7 F (37.1 C)  SpO2: 100% 100%   Vitals:   08/04/22 2333 08/05/22 0256 08/05/22 0622 08/05/22 1041  BP:  97/60 105/64 98/64  Pulse:  90 74 90  Resp:  18 18 20   Temp:  98.7 F (37.1 C) 98.3 F (36.8 C) 98.7 F (37.1 C)  TempSrc:  Oral Oral Oral  SpO2: 93% 100% 100% 100%  Weight:      Height:        General appearance : Awake, alert, not in any distress. Speech Clear. Not toxic looking HEENT: Atraumatic and Normocephalic, pupils equally reactive to light and accomodation Neck: Supple, no JVD. No cervical lymphadenopathy.  Chest: Good air entry bilaterally, no added sounds  CVS: S1 S2 regular, no murmurs.  Abdomen: Bowel sounds present, Non tender and not distended with no gaurding, rigidity or rebound. Extremities: B/L Lower Ext shows no edema, both legs are warm to touch Neurology: Awake alert, and oriented X 3, CN II-XII intact, Non focal Skin: No Rash  Discharge Instructions  Discharge Instructions     Diet - low sodium heart healthy   Complete by: As directed    Increase activity slowly   Complete by: As directed       Allergies as of 08/05/2022       Reactions   Nsaids Other (See Comments)   Kidney disease   Morphine And Codeine Other (See Comments)   "Real bad headaches"         Medication List     TAKE these medications    allopurinol 100 MG tablet Commonly known as: ZYLOPRIM Take 0.5 tablets (50 mg total) by mouth daily.   amitriptyline 25 MG tablet Commonly known as: ELAVIL Take 50 mg by mouth at bedtime.   calcitRIOL 0.25 MCG capsule Commonly known as: ROCALTROL Take 0.25 mcg by mouth daily with breakfast.   colchicine 0.6 MG tablet Take 0.5 tablets (0.3 mg total) by mouth daily.   Ferriprox Twice-A-Day 1000 MG Tabs Generic drug: Deferiprone (Twice  Daily) Take 3,000 mg by mouth daily.   folic acid 1 MG tablet Commonly known as: FOLVITE TAKE 1 TABLET BY MOUTH EVERY DAY What changed: when to take this   hydroxyurea 500 MG capsule Commonly known as: HYDREA Take 1 capsule by mouth in the morning on Sun/Tues/Wed/Thurs/Sat and 2 capsules on Mon/Fri What changed: additional instructions   Oxbryta 500 MG Tabs tablet Generic drug: voxelotor Take 1,500 mg by mouth daily with breakfast.   oxyCODONE 15 mg 12 hr tablet Commonly known as: OXYCONTIN Take 1 tablet (15 mg) by mouth every 12 hours.   Oxycodone HCl 10 MG Tabs Take 1 tablet (10 mg total) by mouth every 6 (six) hours as needed for up to 15 days (pain).   sodium bicarbonate 650 MG tablet Take 1,950 mg by mouth with breakfast, with lunch, and with evening meal.   Tylenol 8 Hour Arthritis Pain 650 MG CR tablet Generic drug: acetaminophen  Take 650 mg by mouth every 8 (eight) hours as needed for pain.   Veltassa 16.8 g Pack Generic drug: Patiromer Sorbitex Calcium Take 16.8 g by mouth daily.        The results of significant diagnostics from this hospitalization (including imaging, microbiology, ancillary and laboratory) are listed below for reference.    Significant Diagnostic Studies: No results found.  Microbiology: No results found for this or any previous visit (from the past 240 hour(s)).   Labs: Basic Metabolic Panel: Recent Labs  Lab 08/03/22 0945 08/04/22 1026 08/05/22 0742  NA 138 132* 136  K 3.3* 4.1 4.1  CL 102 105 105  CO2 25 21* 22  GLUCOSE 93 94 87  BUN 36* 29* 26*  CREATININE 3.16* 2.59* 2.44*  CALCIUM 8.7* 8.2* 8.4*   Liver Function Tests: Recent Labs  Lab 08/03/22 0945  AST 55*  ALT 49*  ALKPHOS 136*  BILITOT 1.4*  PROT 7.2  ALBUMIN 3.5   No results for input(s): "LIPASE", "AMYLASE" in the last 168 hours. No results for input(s): "AMMONIA" in the last 168 hours. CBC: Recent Labs  Lab 08/03/22 0945 08/04/22 0624  08/05/22 0742  WBC 10.6* 11.5* 9.3  NEUTROABS 5.4  --   --   HGB 8.7* 8.1* 8.9*  HCT 24.5* 23.7* 26.1*  MCV 113.4* 117.9* 117.0*  PLT 141* 124* 124*   Cardiac Enzymes: No results for input(s): "CKTOTAL", "CKMB", "CKMBINDEX", "TROPONINI" in the last 168 hours. BNP: Invalid input(s): "POCBNP" CBG: No results for input(s): "GLUCAP" in the last 168 hours.  Time coordinating discharge: 50 minutes  Signed:  Katye Valek  [facility] 08/05/2022, 11:34 AM

## 2022-08-07 ENCOUNTER — Telehealth: Payer: Self-pay

## 2022-08-07 NOTE — Transitions of Care (Post Inpatient/ED Visit) (Signed)
08/07/2022  Name: [name] [name]. MRN: [id number] DOB: [birth year]  Today's TOC FU Call Status: Today's TOC FU Call Status:: Successful TOC FU Call Competed TOC FU Call Complete Date: 08/07/22  Transition Care Management Follow-up Telephone Call Date of Discharge: 08/05/22 Discharge Facility: [facility]) Type of Discharge: Inpatient Admission Primary Inpatient Discharge Diagnosis:: Sickle cell pain crisis How have you been since you were released from the hospital?: Better Any questions or concerns?: No  Items Reviewed: Did you receive and understand the discharge instructions provided?: Yes Medications obtained,verified, and reconciled?: Yes (Medications Reviewed) Any new allergies since your discharge?: No Dietary orders reviewed?: Yes Do you have support at home?: Yes  Medications Reviewed Today: Medications Reviewed Today     Reviewed by [name], LPN (Licensed Practical Nurse) on 08/07/22 at 1102  Med List Status: <None>   Medication Order Taking? Sig Documenting Provider Last Dose Status Informant  allopurinol (ZYLOPRIM) 100 MG tablet [id number] Yes Take 0.5 tablets (50 mg total) by mouth daily. Rometta Emery, MD Taking Active Self  amitriptyline (ELAVIL) 25 MG tablet [id number] Yes Take 50 mg by mouth at bedtime. [provider] Taking Active Self           Med Note [name], [name]   Thu Aug 03, 2022  7:34 PM) PROVIDER: The patient has requested a new Rx for this, please  calcitRIOL (ROCALTROL) 0.25 MCG capsule [id number] Yes Take 0.25 mcg by mouth daily with breakfast. [provider] Taking Active Self  colchicine 0.6 MG tablet [id number] No Take 0.5 tablets (0.3 mg total) by mouth daily.  Patient not taking: Reported on 08/07/2022   Rometta Emery, MD Not Taking Active Self  Deferiprone, Twice Daily, (FERRIPROX TWICE-A-DAY) 1000 MG TABS [id number] Yes Take 3,000 mg by mouth daily. Massie Maroon, FNP Taking Active Self            Med Note [name], [name]   Thu Aug 03, 2022  7:31 PM) Patient said he takes "3,000 mg a day"  folic acid (FOLVITE) 1 MG tablet [id number] Yes TAKE 1 TABLET BY MOUTH EVERY DAY  Patient taking differently: Take 1 mg by mouth every morning.   Massie Maroon, FNP Taking Active Self           Med Note (COFFELL, Marzella Schlein   Thu Jun 23, 2021 11:44 AM)    hydroxyurea (HYDREA) 500 MG capsule [id number] Yes Take 1 capsule by mouth in the morning on Sun/Tues/Wed/Thurs/Sat and 2 capsules on Mon/Fri  Patient taking differently: Take 500-1,000 mg by mouth See admin instructions. Take 500 mg by mouth in the morning on Sun/Tues/Wed/Thurs/Sat and 1,000 mg on Mon/Fri   Massie Maroon, FNP Taking Active Self  oxyCODONE (OXYCONTIN) 15 mg 12 hr tablet [id number] Yes Take 1 tablet (15 mg) by mouth every 12 hours. Massie Maroon, FNP Taking Active Self  Oxycodone HCl 10 MG TABS [id number] Yes Take 1 tablet (10 mg total) by mouth every 6 (six) hours as needed for up to 15 days (pain). Quentin Angst, MD Taking Active   sodium bicarbonate 650 MG tablet [id number] Yes Take 1,950 mg by mouth with breakfast, with lunch, and with evening meal. [provider] Taking Active Self           Med Note [name], [name]   Thu Aug 03, 2022  7:28 PM)    TYLENOL 8 HOUR ARTHRITIS PAIN 650 MG CR tablet [id number] Yes Take 650  mg by mouth every 8 (eight) hours as needed for pain. [provider] Taking Active Self           Med Note Nolon Bussing   Mon Apr 03, 2022  1:32 PM)    VELTASSA 16.8 g PACK [id number] Yes Take 16.8 g by mouth daily. [provider] Taking Active Self           Med Note Nolon Bussing   Mon Apr 03, 2022  1:32 PM)    voxelotor (OXBRYTA) 500 MG TABS tablet [id number] Yes Take 1,500 mg by mouth daily with breakfast. [provider] Taking Active Self            Home Care and Equipment/Supplies: Were Home Health Services Ordered?: No Any new equipment or  medical supplies ordered?: No  Functional Questionnaire: Do you need assistance with bathing/showering or dressing?: No Do you need assistance with meal preparation?: No Do you need assistance with eating?: No Do you have difficulty maintaining continence: No Do you need assistance with getting out of bed/getting out of a chair/moving?: No Do you have difficulty managing or taking your medications?: No  Follow up appointments reviewed: PCP Follow-up appointment confirmed?: No (no appts available- will ask front desk to please call pt to schedule fu appt prior to 08-18-22) Follow-up Provider: Julianne Handler FNP Specialist [facility] Follow-up appointment confirmed?: No Do you need transportation to your follow-up appointment?: No Do you understand care options if your condition(s) worsen?: Yes-patient verbalized understanding    SIGNATURE  Woodfin Ganja LPN [facility] Nurse Health Advisor Direct Dial [phone]

## 2022-08-10 ENCOUNTER — Other Ambulatory Visit: Payer: Self-pay | Admitting: Family Medicine

## 2022-08-10 DIAGNOSIS — G4700 Insomnia, unspecified: Secondary | ICD-10-CM

## 2022-08-10 MED ORDER — AMITRIPTYLINE HCL 25 MG PO TABS
50.0000 mg | ORAL_TABLET | Freq: Every day | ORAL | 5 refills | Status: DC
Start: 2022-08-10 — End: 2023-01-25

## 2022-08-10 NOTE — Progress Notes (Signed)
Meds ordered this encounter  Medications   amitriptyline (ELAVIL) 25 MG tablet    Sig: Take 2 tablets (50 mg total) by mouth at bedtime.    Dispense:  60 tablet    Refill:  5    Order Specific Question:   Supervising Provider    Answer:   Quentin Angst [id number]

## 2022-08-10 NOTE — Telephone Encounter (Signed)
Caller & Relationship to patient:  MRN #  [id number]   Call Back Number:   Date of Last Office Visit: 07/25/2022     Date of Next Office Visit: 08/07/2022    Medication(s) to be Refilled: Amitripyline  Preferred Pharmacy:   ** Please notify patient to allow 48-72 hours to process** **Let patient know to contact pharmacy at the end of the day to make sure medication is ready. ** **If patient has not been seen in a year or longer, book an appointment **Advise to use MyChart for refill requests OR to contact their pharmacy

## 2022-08-15 ENCOUNTER — Non-Acute Institutional Stay (HOSPITAL_COMMUNITY)
Admission: RE | Admit: 2022-08-15 | Discharge: 2022-08-15 | Disposition: A | Discharge status: Home / Self Care | Payer: Medicare Other | Source: Ambulatory Visit | Attending: Internal Medicine | Admitting: Internal Medicine

## 2022-08-15 DIAGNOSIS — D5700 Hb-SS disease with crisis, unspecified: Secondary | ICD-10-CM | POA: Insufficient documentation

## 2022-08-15 LAB — COMPREHENSIVE METABOLIC PANEL
ALT: 27 U/L (ref 0–44)
AST: 29 U/L (ref 15–41)
Albumin: 3.1 g/dL — ABNORMAL LOW (ref 3.5–5.0)
Alkaline Phosphatase: 111 U/L (ref 38–126)
Anion gap: 13 (ref 5–15)
BUN: 37 mg/dL — ABNORMAL HIGH (ref 6–20)
CO2: 20 mmol/L — ABNORMAL LOW (ref 22–32)
Calcium: 8.3 mg/dL — ABNORMAL LOW (ref 8.9–10.3)
Chloride: 105 mmol/L (ref 98–111)
Creatinine, Ser: 3 mg/dL — ABNORMAL HIGH (ref 0.61–1.24)
GFR, Estimated: 26 mL/min — ABNORMAL LOW (ref >60)
Glucose, Bld: 84 mg/dL (ref 70–99)
Potassium: 4.4 mmol/L (ref 3.5–5.1)
Sodium: 138 mmol/L (ref 135–145)
Total Bilirubin: 1 mg/dL (ref 0.3–1.2)
Total Protein: 6.8 g/dL (ref 6.5–8.1)

## 2022-08-15 LAB — CBC WITH DIFFERENTIAL/PLATELET
Abs Immature Granulocytes: 0.02 K/uL (ref 0.00–0.07)
Basophils Absolute: 0 K/uL (ref 0.0–0.1)
Basophils Relative: 0 %
Eosinophils Absolute: 0 K/uL (ref 0.0–0.5)
Eosinophils Relative: 0 %
HCT: 24.7 % — ABNORMAL LOW (ref 39.0–52.0)
Hemoglobin: 8.6 g/dL — ABNORMAL LOW (ref 13.0–17.0)
Immature Granulocytes: 0 %
Lymphocytes Relative: 53 %
Lymphs Abs: 3.6 K/uL (ref 0.7–4.0)
MCH: 42 pg — ABNORMAL HIGH (ref 26.0–34.0)
MCHC: 34.8 g/dL (ref 30.0–36.0)
MCV: 120.5 fL — ABNORMAL HIGH (ref 80.0–100.0)
Monocytes Absolute: 0.3 K/uL (ref 0.1–1.0)
Monocytes Relative: 4 %
Neutro Abs: 3 K/uL (ref 1.7–7.7)
Neutrophils Relative %: 43 %
Platelets: 149 K/uL — ABNORMAL LOW (ref 150–400)
RBC: 2.05 MIL/uL — ABNORMAL LOW (ref 4.22–5.81)
RDW: 18.8 % — ABNORMAL HIGH (ref 11.5–15.5)
WBC: 6.9 K/uL (ref 4.0–10.5)
nRBC: 0.3 % — ABNORMAL HIGH (ref 0.0–0.2)

## 2022-08-15 LAB — RETICULOCYTES
Immature Retic Fract: 24.9 % — ABNORMAL HIGH (ref 2.3–15.9)
RBC.: 2.05 MIL/uL — ABNORMAL LOW (ref 4.22–5.81)
Retic Count, Absolute: 51 10*3/uL (ref 19.0–186.0)
Retic Ct Pct: 2.5 % (ref 0.4–3.1)

## 2022-08-15 NOTE — Progress Notes (Signed)
PATIENT CARE CENTER NOTE   Diagnosis: Sickle Cell Anemia    Provider: Hollis, [place], FNP   Procedure:Lab work (CBC w/diff, Reticu, CMP)   Note:  Patient's labs drawn peripherally by phlebotomist. Patient tolerated well. Patient alert, oriented and ambulatory at discharge.

## 2022-08-22 ENCOUNTER — Other Ambulatory Visit: Payer: Self-pay | Admitting: Family Medicine

## 2022-08-22 DIAGNOSIS — D571 Sickle-cell disease without crisis: Secondary | ICD-10-CM

## 2022-08-22 MED ORDER — OXYCODONE HCL ER 15 MG PO T12A
15.0000 mg | EXTENDED_RELEASE_TABLET | Freq: Two times a day (BID) | ORAL | 0 refills | Status: DC
Start: 2022-08-23 — End: 2022-09-21

## 2022-08-22 NOTE — Telephone Encounter (Signed)
Reviewed PDMP substance reporting system prior to prescribing opiate medications. No inconsistencies noted.  Meds ordered this encounter  Medications   oxyCODONE (OXYCONTIN) 15 mg 12 hr tablet    Sig: Take 1 tablet (15 mg) by mouth every 12 hours.    Dispense:  60 tablet    Refill:  0    Order Specific Question:   Supervising Provider    Answer:   Quentin Angst [[id number]]   [name]  APRN, MSN, FNP-C Patient Care [name] [address] [place], [place] [address] [phone]

## 2022-08-22 NOTE — Telephone Encounter (Signed)
From: [name] To: Office of Julianne Handler, [place] Sent: 08/21/2022 2:05 PM EDT Subject: Medication Renewal Request  Refills have been requested for the following medications:   oxyCODONE (OXYCONTIN) 15 mg 12 hr tablet [Ilynn Stauffer]  Preferred pharmacy: [id number] - [place], [place] - [address] AT [address] Delivery method: Daryll Drown

## 2022-08-22 NOTE — Telephone Encounter (Signed)
Caller & Relationship to patient:  MRN #  [id number]   Call Back Number:   Date of Last Office Visit: 07/25/2022     Date of Next Office Visit: 08/07/2022    Medication(s) to be Refilled: Oxycodone Oxy Contin  Preferred Pharmacy:   ** Please notify patient to allow 48-72 hours to process** **Let patient know to contact pharmacy at the end of the day to make sure medication is ready. ** **If patient has not been seen in a year or longer, book an appointment **Advise to use MyChart for refill requests OR to contact their pharmacy

## 2022-08-23 ENCOUNTER — Telehealth: Payer: Self-pay

## 2022-08-23 ENCOUNTER — Other Ambulatory Visit: Payer: Self-pay

## 2022-08-23 ENCOUNTER — Other Ambulatory Visit: Payer: Self-pay | Admitting: Family Medicine

## 2022-08-23 DIAGNOSIS — D571 Sickle-cell disease without crisis: Secondary | ICD-10-CM

## 2022-08-23 DIAGNOSIS — G894 Chronic pain syndrome: Secondary | ICD-10-CM

## 2022-08-23 MED ORDER — OXYCODONE HCL 10 MG PO TABS: 10.0000 mg | ORAL_TABLET | Freq: Four times a day (QID) | ORAL | 0 refills | Status: DC | PRN

## 2022-08-23 NOTE — Progress Notes (Signed)
Reviewed PDMP substance reporting system prior to prescribing opiate medications. No inconsistencies noted.  Meds ordered this encounter  Medications   Oxycodone HCl 10 MG TABS    Sig: Take 1 tablet (10 mg total) by mouth every 6 (six) hours as needed.    Dispense:  60 tablet    Refill:  0    Order Specific Question:   Supervising Provider    Answer:   [name], [name] [[id number]]   Riane Rung Moore Dardan Shelton  APRN, MSN, FNP-C Patient Care [facility] [address]  [place], [place] [address] [phone]  

## 2022-08-23 NOTE — Telephone Encounter (Signed)
A prior authorization request for Oxycontin has been submitted to insurance via CoverMyMeds Key: [id number]

## 2022-08-23 NOTE — Telephone Encounter (Signed)
Caller & Relationship to patient:  MRN #  [id number]   Call Back Number:   Date of Last Office Visit: 07/25/2022     Date of Next Office Visit: 08/07/2022    Medication(s) to be Refilled: Oxy contin 10  Preferred Pharmacy:   ** Please notify patient to allow 48-72 hours to process** **Let patient know to contact pharmacy at the end of the day to make sure medication is ready. ** **If patient has not been seen in a year or longer, book an appointment **Advise to use MyChart for refill requests OR to contact their pharmacy

## 2022-08-24 ENCOUNTER — Other Ambulatory Visit: Payer: Self-pay

## 2022-08-24 ENCOUNTER — Telehealth: Payer: Self-pay

## 2022-08-24 NOTE — Telephone Encounter (Signed)
A PA for Oxycontin has been approved by insurance until further notice.

## 2022-09-04 ENCOUNTER — Encounter (HOSPITAL_COMMUNITY): Payer: Self-pay | Admitting: Family Medicine

## 2022-09-04 ENCOUNTER — Telehealth (HOSPITAL_COMMUNITY): Payer: Self-pay | Admitting: *Deleted

## 2022-09-04 ENCOUNTER — Inpatient Hospital Stay (HOSPITAL_COMMUNITY)
Admission: AD | Admit: 2022-09-04 | Discharge: 2022-09-06 | DRG: 812 | Disposition: A | Discharge status: Home / Self Care | Payer: Medicare Other | Source: Ambulatory Visit | Attending: Internal Medicine | Admitting: Internal Medicine

## 2022-09-04 ENCOUNTER — Other Ambulatory Visit: Payer: Self-pay

## 2022-09-04 DIAGNOSIS — D571 Sickle-cell disease without crisis: Secondary | ICD-10-CM

## 2022-09-04 DIAGNOSIS — G43909 Migraine, unspecified, not intractable, without status migrainosus: Secondary | ICD-10-CM | POA: Diagnosis present

## 2022-09-04 DIAGNOSIS — D631 Anemia in chronic kidney disease: Secondary | ICD-10-CM | POA: Diagnosis present

## 2022-09-04 DIAGNOSIS — D638 Anemia in other chronic diseases classified elsewhere: Secondary | ICD-10-CM | POA: Diagnosis present

## 2022-09-04 DIAGNOSIS — Z9049 Acquired absence of other specified parts of digestive tract: Secondary | ICD-10-CM | POA: Diagnosis not present

## 2022-09-04 DIAGNOSIS — Z79899 Other long term (current) drug therapy: Secondary | ICD-10-CM | POA: Diagnosis not present

## 2022-09-04 DIAGNOSIS — D649 Anemia, unspecified: Secondary | ICD-10-CM | POA: Diagnosis present

## 2022-09-04 DIAGNOSIS — Z886 Allergy status to analgesic agent status: Secondary | ICD-10-CM | POA: Diagnosis not present

## 2022-09-04 DIAGNOSIS — Z885 Allergy status to narcotic agent status: Secondary | ICD-10-CM

## 2022-09-04 DIAGNOSIS — F17210 Nicotine dependence, cigarettes, uncomplicated: Secondary | ICD-10-CM | POA: Diagnosis present

## 2022-09-04 DIAGNOSIS — G894 Chronic pain syndrome: Secondary | ICD-10-CM

## 2022-09-04 DIAGNOSIS — Z803 Family history of malignant neoplasm of breast: Secondary | ICD-10-CM | POA: Diagnosis not present

## 2022-09-04 DIAGNOSIS — D72829 Elevated white blood cell count, unspecified: Secondary | ICD-10-CM | POA: Diagnosis present

## 2022-09-04 DIAGNOSIS — D5700 Hb-SS disease with crisis, unspecified: Principal | ICD-10-CM

## 2022-09-04 DIAGNOSIS — Z9081 Acquired absence of spleen: Secondary | ICD-10-CM | POA: Diagnosis not present

## 2022-09-04 DIAGNOSIS — N184 Chronic kidney disease, stage 4 (severe): Secondary | ICD-10-CM | POA: Diagnosis present

## 2022-09-04 LAB — CBC WITH DIFFERENTIAL/PLATELET
Abs Immature Granulocytes: 0.04 10*3/uL (ref 0.00–0.07)
Basophils Absolute: 0 10*3/uL (ref 0.0–0.1)
Basophils Relative: 0 %
Eosinophils Absolute: 0 10*3/uL (ref 0.0–0.5)
Eosinophils Relative: 0 %
HCT: 18.6 % — ABNORMAL LOW (ref 39.0–52.0)
Hemoglobin: 6.4 g/dL — CL (ref 13.0–17.0)
Immature Granulocytes: 0 %
Lymphocytes Relative: 31 %
Lymphs Abs: 3.5 10*3/uL (ref 0.7–4.0)
MCH: 41.8 pg — ABNORMAL HIGH (ref 26.0–34.0)
MCHC: 34.4 g/dL (ref 30.0–36.0)
MCV: 121.6 fL — ABNORMAL HIGH (ref 80.0–100.0)
Monocytes Absolute: 0.5 10*3/uL (ref 0.1–1.0)
Monocytes Relative: 4 %
Neutro Abs: 7.2 10*3/uL (ref 1.7–7.7)
Neutrophils Relative %: 65 %
Platelets: 187 10*3/uL (ref 150–400)
RBC: 1.53 MIL/uL — ABNORMAL LOW (ref 4.22–5.81)
RDW: 23.8 % — ABNORMAL HIGH (ref 11.5–15.5)
WBC: 11.2 10*3/uL — ABNORMAL HIGH (ref 4.0–10.5)
nRBC: 0.4 % — ABNORMAL HIGH (ref 0.0–0.2)

## 2022-09-04 LAB — TYPE AND SCREEN
Antibody Screen: NEGATIVE
Donor AG Type: NEGATIVE
Unit division: 0

## 2022-09-04 LAB — COMPREHENSIVE METABOLIC PANEL
ALT: 33 U/L (ref 0–44)
AST: 34 U/L (ref 15–41)
Albumin: 3.4 g/dL — ABNORMAL LOW (ref 3.5–5.0)
Alkaline Phosphatase: 151 U/L — ABNORMAL HIGH (ref 38–126)
Anion gap: 13 (ref 5–15)
BUN: 55 mg/dL — ABNORMAL HIGH (ref 6–20)
CO2: 23 mmol/L (ref 22–32)
Calcium: 8.5 mg/dL — ABNORMAL LOW (ref 8.9–10.3)
Chloride: 100 mmol/L (ref 98–111)
Creatinine, Ser: 3.96 mg/dL — ABNORMAL HIGH (ref 0.61–1.24)
GFR, Estimated: 19 mL/min — ABNORMAL LOW (ref >60)
Glucose, Bld: 96 mg/dL (ref 70–99)
Potassium: 4.3 mmol/L (ref 3.5–5.1)
Sodium: 136 mmol/L (ref 135–145)
Total Bilirubin: 1.3 mg/dL — ABNORMAL HIGH (ref 0.3–1.2)
Total Protein: 7.2 g/dL (ref 6.5–8.1)

## 2022-09-04 LAB — RETICULOCYTES
Immature Retic Fract: 41.4 % — ABNORMAL HIGH (ref 2.3–15.9)
RBC.: 1.54 MIL/uL — ABNORMAL LOW (ref 4.22–5.81)
Retic Count, Absolute: 84.4 10*3/uL (ref 19.0–186.0)
Retic Ct Pct: 5.5 % — ABNORMAL HIGH (ref 0.4–3.1)

## 2022-09-04 LAB — BPAM RBC
ISSUE DATE / TIME: 202407221619
Unit Type and Rh: 5100

## 2022-09-04 LAB — PREPARE RBC (CROSSMATCH)

## 2022-09-04 MED ORDER — AMITRIPTYLINE HCL 25 MG PO TABS
50.0000 mg | ORAL_TABLET | Freq: Every day | ORAL | Status: DC | PRN
  Administered 2022-09-04 – 2022-09-05 (×2): 50 mg via ORAL
  Filled 2022-09-04 (×2): qty 2

## 2022-09-04 MED ORDER — DIPHENHYDRAMINE HCL 50 MG/ML IJ SOLN
25.0000 mg | Freq: Once | INTRAMUSCULAR | Status: AC
  Administered 2022-09-04: 25 mg via INTRAVENOUS
  Filled 2022-09-04: qty 1

## 2022-09-04 MED ORDER — PATIROMER SORBITEX CALCIUM 8.4 G PO PACK
16.8000 g | PACK | Freq: Every day | ORAL | Status: DC
  Administered 2022-09-06: 16.8 g via ORAL
  Filled 2022-09-04 (×2): qty 2

## 2022-09-04 MED ORDER — SODIUM CHLORIDE 0.9% IV SOLUTION: Freq: Once | INTRAVENOUS | Status: AC

## 2022-09-04 MED ORDER — OXYCODONE HCL ER 15 MG PO T12A
15.0000 mg | EXTENDED_RELEASE_TABLET | Freq: Two times a day (BID) | ORAL | Status: DC
  Administered 2022-09-04 – 2022-09-06 (×5): 15 mg via ORAL
  Filled 2022-09-04 (×5): qty 1

## 2022-09-04 MED ORDER — ACETAMINOPHEN 500 MG PO TABS
1000.0000 mg | ORAL_TABLET | Freq: Once | ORAL | Status: AC
  Administered 2022-09-04: 1000 mg via ORAL
  Filled 2022-09-04: qty 2

## 2022-09-04 MED ORDER — DIPHENHYDRAMINE HCL 25 MG PO CAPS
25.0000 mg | ORAL_CAPSULE | ORAL | Status: DC | PRN
  Filled 2022-09-04: qty 1

## 2022-09-04 MED ORDER — NALOXONE HCL 0.4 MG/ML IJ SOLN: 0.4000 mg | INTRAMUSCULAR | Status: DC | PRN

## 2022-09-04 MED ORDER — SODIUM BICARBONATE 650 MG PO TABS
975.0000 mg | ORAL_TABLET | Freq: Three times a day (TID) | ORAL | Status: DC
  Administered 2022-09-04 – 2022-09-06 (×5): 975 mg via ORAL
  Filled 2022-09-04 (×2): qty 2
  Filled 2022-09-04: qty 1.5
  Filled 2022-09-04 (×3): qty 2

## 2022-09-04 MED ORDER — VOXELOTOR 500 MG PO TABS: 1500.0000 mg | ORAL_TABLET | Freq: Every day | ORAL | Status: DC

## 2022-09-04 MED ORDER — SODIUM CHLORIDE 0.9% FLUSH: 9.0000 mL | INTRAVENOUS | Status: DC | PRN

## 2022-09-04 MED ORDER — AMITRIPTYLINE HCL 25 MG PO TABS
50.0000 mg | ORAL_TABLET | Freq: Every day | ORAL | Status: DC
  Filled 2022-09-04: qty 1

## 2022-09-04 MED ORDER — SENNOSIDES-DOCUSATE SODIUM 8.6-50 MG PO TABS
1.0000 | ORAL_TABLET | Freq: Two times a day (BID) | ORAL | Status: DC
  Filled 2022-09-04 (×3): qty 1

## 2022-09-04 MED ORDER — HYDROMORPHONE 1 MG/ML IV SOLN
INTRAVENOUS | Status: DC
  Administered 2022-09-04: 3.29 mg via INTRAVENOUS
  Administered 2022-09-04: 4.5 mg via INTRAVENOUS
  Administered 2022-09-04: 4 mg via INTRAVENOUS
  Administered 2022-09-04: 30 mg via INTRAVENOUS
  Administered 2022-09-05: 0.5 mg via INTRAVENOUS
  Administered 2022-09-05: 4 mg via INTRAVENOUS
  Filled 2022-09-04: qty 30

## 2022-09-04 MED ORDER — FOLIC ACID 1 MG PO TABS
1.0000 mg | ORAL_TABLET | Freq: Every day | ORAL | Status: DC
  Administered 2022-09-05 – 2022-09-06 (×2): 1 mg via ORAL
  Filled 2022-09-04 (×3): qty 1

## 2022-09-04 MED ORDER — SODIUM CHLORIDE 0.45 % IV SOLN: INTRAVENOUS | Status: DC

## 2022-09-04 MED ORDER — ALLOPURINOL 100 MG PO TABS
50.0000 mg | ORAL_TABLET | Freq: Every day | ORAL | Status: DC
  Filled 2022-09-04: qty 0.5
  Filled 2022-09-04 (×2): qty 1

## 2022-09-04 MED ORDER — POLYETHYLENE GLYCOL 3350 17 G PO PACK: 17.0000 g | PACK | Freq: Every day | ORAL | Status: DC | PRN

## 2022-09-04 MED ORDER — SODIUM BICARBONATE 650 MG PO TABS: 1950.0000 mg | ORAL_TABLET | Freq: Three times a day (TID) | ORAL | Status: DC

## 2022-09-04 MED ORDER — ONDANSETRON HCL 4 MG/2ML IJ SOLN
4.0000 mg | Freq: Four times a day (QID) | INTRAMUSCULAR | Status: DC | PRN
  Administered 2022-09-04: 4 mg via INTRAVENOUS
  Filled 2022-09-04: qty 2

## 2022-09-04 MED ORDER — CALCITRIOL 0.25 MCG PO CAPS
0.2500 ug | ORAL_CAPSULE | Freq: Every day | ORAL | Status: DC
  Administered 2022-09-05 – 2022-09-06 (×2): 0.25 ug via ORAL
  Filled 2022-09-04 (×2): qty 1

## 2022-09-04 MED ORDER — DEFERIPRONE (TWICE DAILY) 1000 MG PO TABS: 3000.0000 mg | ORAL_TABLET | Freq: Every day | ORAL | Status: DC

## 2022-09-04 NOTE — Telephone Encounter (Signed)
Patient called requesting to come to the day hospital for sickle cell pain. Patient reports generalized pain rated 10/10. Also reports some dizziness and fatigue. Reports taking Oxycodone 10 mg and Oxycontin 15 mg around 1 hour ago. Patient is also concerned that his kidney function is abnormal due to dark color of his urine. Denies fever, chest pain, nausea, vomiting, diarrhea, abdominal pain and priapism. Admits to having transportation without driving self. [place], FNP notified. Patient can come to the day hospital for pain management. Patient advised and expresses an understanding.

## 2022-09-04 NOTE — Progress Notes (Signed)
  Critical Value: Hgb: 6.4  Time and Date notified: 09/04/2022 @11 :33 AM  Provider notified: [place] Hollis, FNP  Action Taken: Orders placed for patient to receive 1 unit PRBCS and admission orders placed for pt to be admitted to hospital.

## 2022-09-04 NOTE — Plan of Care (Signed)
  Problem: Education: Goal: Knowledge of vaso-occlusive preventative measures will improve Outcome: Progressing Goal: Awareness of signs and symptoms of anemia will improve Outcome: Progressing

## 2022-09-04 NOTE — Progress Notes (Signed)
Patient admitted to day hospital today for sickle cell pain treatment. On arrival, pt reports 10/10 generalized pain. Pt received Dilaudid PCA and hydrated with IV fluids via PIV. Pt also received PO Tylenol. Pts hgb resulted at 6.4. [place] Hollis, FNP notified. Due to pt needing blood transfusion and ongoin pain, pt is being admitted to hospital by [place] Hollis, FNP. Type and Screen sent, and pt is wearing blood bank bracelet on wrist. Report given to Alexander, Charity fundraiser. At time of transfer, pt rates pain 8/10. PCA settings verified with Danella Maiers, RN. PIV remains C/D/I and infusing fluids and PCA. Pt is alert, oriented, and ambulatory at transfer. Pt transferred in wheelchair to [address] [place], bed 42 by RN.

## 2022-09-04 NOTE — Progress Notes (Signed)
Patient educated; however, adamantly refused to turn in bedside medication into the pharmacy Ferriprox or [place]. Patient agreed to give medications to partner when she comes to visit this evening. Hart Rochester, NP and [place], [facility] notified.

## 2022-09-04 NOTE — H&P (Signed)
H&P  Patient Demographics:  [name] [name], is a 42 y.o. male  MRN: [id number]   DOB - 06-May-1980  Admit Date - 09/04/2022  Outpatient Primary MD for the patient is Massie Maroon, FNP  No chief complaint on file.     HPI:   [name] [name]  is a 42 y.o. male    Review of systems:  In addition to the HPI above, patient reports No fever or chills No Headache, No changes with vision or hearing No problems swallowing food or liquids No chest pain, cough or shortness of breath No abdominal pain, No nausea or vomiting, Bowel movements are regular No blood in stool or urine No dysuria No new skin rashes or bruises No new joints pains-aches No new weakness, tingling, numbness in any extremity No recent weight gain or loss No polyuria, polydypsia or polyphagia No significant Mental Stressors  A full 10 point Review of Systems was done, except as stated above, all other Review of Systems were negative.  With Past History of the following :   Past Medical History:  Diagnosis Date   Abscess of right iliac fossa 09/24/2018   required Perc Drain 09/24/2018   Arachnoid Cyst of brain bilaterally    "2 really small ones in the back of my head; inside; saw them w/MRI" (09/25/2012)   Bacterial pneumonia ~ 2012   "caught it here in the hospital" (09/25/2012)   Chronic kidney disease    "from my sickle cell" (09/25/2012)   CKD (chronic kidney disease), stage II    Colitis 04/19/2017   CT scan abd/ pelvis   GERD (gastroesophageal reflux disease)    "after I eat alot of spicey foods" (09/25/2012)   Gynecomastia, male 07/10/2012   History of blood transfusion    "always related to sickle cell crisis" (09/25/2012)   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   Noted in noted from Hematology notes at [facility]   Migraines    "take RX qd to prevent them" (09/25/2012)   Opioid dependence with withdrawal (HCC) 08/30/2012   Renal insufficiency    Sickle cell anemia (HCC)     Sickle cell crisis (HCC) 09/25/2012   Sickle cell nephropathy (HCC) 07/10/2012   Sinus tachycardia    Tachycardia with heart rate 121-140 beats per minute with ambulation 08/04/2016      Past Surgical History:  Procedure Laterality Date   ABCESS DRAINAGE     CHOLECYSTECTOMY  ~ 2012   COLONOSCOPY N/A 04/23/2017   Procedure: COLONOSCOPY;  Surgeon: Hilarie Fredrickson, MD;  Location: [facility];  Service: Endoscopy;  Laterality: N/A;   IR FLUORO GUIDE CV LINE RIGHT  12/17/2016   IR FLUORO GUIDE CV LINE RIGHT  02/07/2021   IR RADIOLOGIST EVAL & MGMT  10/02/2018   IR RADIOLOGIST EVAL & MGMT  10/15/2018   IR REMOVAL TUN CV CATH W/O FL  12/21/2016   IR REMOVAL TUN CV CATH W/O FL  02/11/2021   IR US GUIDE VASC ACCESS RIGHT  12/17/2016   IR US GUIDE VASC ACCESS RIGHT  02/07/2021   spleenectomy       Social History:   Social History   Tobacco Use   Smoking status: Some Days    Current packs/day: 0.10    Types: Cigarettes   Smokeless tobacco: Never  Substance Use Topics   Alcohol use: Yes    Comment: Once a month     Lives - At home   Family History :   Family History  Problem  Relation Age of Onset   Breast cancer Mother      Home Medications:   Prior to Admission medications   Medication Sig Start Date End Date Taking? Authorizing Provider  allopurinol (ZYLOPRIM) 100 MG tablet Take 0.5 tablets (50 mg total) by mouth daily. 07/01/22  Yes Rometta Emery, MD  amitriptyline (ELAVIL) 25 MG tablet Take 2 tablets (50 mg total) by mouth at bedtime. 08/10/22  Yes Massie Maroon, FNP  calcitRIOL (ROCALTROL) 0.25 MCG capsule Take 0.25 mcg by mouth daily with breakfast. 11/16/20  Yes [provider]  folic acid (FOLVITE) 1 MG tablet TAKE 1 TABLET BY MOUTH EVERY DAY Patient taking differently: Take 1 mg by mouth every morning. 03/22/20  Yes Massie Maroon, FNP  hydroxyurea (HYDREA) 500 MG capsule Take 1 capsule by mouth in the morning on Sun/Tues/Wed/Thurs/Sat and 2 capsules on  Mon/Fri Patient taking differently: Take 500-1,000 mg by mouth See admin instructions. Take 500 mg by mouth in the morning on Sun/Tues/Wed/Thurs/Sat and 1,000 mg on Mon/Fri 05/05/22  Yes Massie Maroon, FNP  oxyCODONE (OXYCONTIN) 15 mg 12 hr tablet Take 1 tablet (15 mg) by mouth every 12 hours. 08/23/22  Yes Massie Maroon, FNP  Oxycodone HCl 10 MG TABS Take 1 tablet (10 mg total) by mouth every 6 (six) hours as needed. 08/23/22  Yes Massie Maroon, FNP  voxelotor (OXBRYTA) 500 MG TABS tablet Take 1,500 mg by mouth daily with breakfast.   Yes [provider]  colchicine 0.6 MG tablet Take 0.5 tablets (0.3 mg total) by mouth daily. Patient not taking: Reported on 08/07/2022 07/01/22   Rometta Emery, MD  Deferiprone, Twice Daily, (FERRIPROX TWICE-A-DAY) 1000 MG TABS Take 3,000 mg by mouth daily. 05/01/22   Massie Maroon, FNP  sodium bicarbonate 650 MG tablet Take 1,950 mg by mouth with breakfast, with lunch, and with evening meal. 11/07/18   [provider]  TYLENOL 8 HOUR ARTHRITIS PAIN 650 MG CR tablet Take 650 mg by mouth every 8 (eight) hours as needed for pain.    [provider]  VELTASSA 16.8 g PACK Take 16.8 g by mouth daily. 10/10/21   [provider]     Allergies:   Allergies  Allergen Reactions   Nsaids Other (See Comments)    Kidney disease   Morphine And Codeine Other (See Comments)    "Real bad headaches"      Physical Exam:   Vitals:   Vitals:   09/04/22 1318 09/04/22 1335  BP:  125/74  Pulse:  97  Resp: 20 20  Temp:  98.2 F (36.8 C)  SpO2: 100% 100%    Physical Exam: Constitutional: Patient appears well-developed and well-nourished. Not in obvious distress. HENT: Normocephalic, atraumatic, External right and left ear normal. Oropharynx is clear and moist.  Eyes: Conjunctivae and EOM are normal. PERRLA, no scleral icterus. Neck: Normal ROM. Neck supple. No JVD. No tracheal deviation. No thyromegaly. CVS: RRR, S1/S2  +, no murmurs, no gallops, no carotid bruit.  Pulmonary: Effort and breath sounds normal, no stridor, rhonchi, wheezes, rales.  Abdominal: Soft. BS +, no distension, tenderness, rebound or guarding.  Musculoskeletal: Normal range of motion. No edema and no tenderness.  Lymphadenopathy: No lymphadenopathy noted, cervical, inguinal or axillary Neuro: Alert. Normal reflexes, muscle tone coordination. No cranial nerve deficit. Skin: Skin is warm and dry. No rash noted. Not diaphoretic. No erythema. No pallor. Psychiatric: Normal mood and affect. Behavior, judgment, thought content normal.  Data Review:   CBC Recent Labs  Lab 09/04/22 0952  WBC 11.2*  HGB 6.4*  HCT 18.6*  PLT 187  MCV 121.6*  MCH 41.8*  MCHC 34.4  RDW 23.8*  LYMPHSABS 3.5  MONOABS 0.5  EOSABS 0.0  BASOSABS 0.0   ------------------------------------------------------------------------------------------------------------------  Chemistries  Recent Labs  Lab 09/04/22 0952  NA 136  K 4.3  CL 100  CO2 23  GLUCOSE 96  BUN 55*  CREATININE 3.96*  CALCIUM 8.5*  AST 34  ALT 33  ALKPHOS 151*  BILITOT 1.3*   ------------------------------------------------------------------------------------------------------------------ estimated creatinine clearance is 17.5 mL/min (A) (by C-G formula based on SCr of 3.96 mg/dL (H)). ------------------------------------------------------------------------------------------------------------------ No results for input(s): "TSH", "T4TOTAL", "T3FREE", "THYROIDAB" in the last 72 hours.  Invalid input(s): "FREET3"  Coagulation profile No results for input(s): "INR", "PROTIME" in the last 168 hours. ------------------------------------------------------------------------------------------------------------------- No results for input(s): "DDIMER" in the last 72  hours. -------------------------------------------------------------------------------------------------------------------  Cardiac Enzymes No results for input(s): "CKMB", "TROPONINI", "MYOGLOBIN" in the last 168 hours.  Invalid input(s): "CK" ------------------------------------------------------------------------------------------------------------------ No results found for: "BNP"  ---------------------------------------------------------------------------------------------------------------  Urinalysis    Component Value Date/Time   COLORURINE YELLOW 04/03/2022 1011   APPEARANCEUR CLEAR 04/03/2022 1011   LABSPEC 1.008 04/03/2022 1011   PHURINE 8.0 04/03/2022 1011   GLUCOSEU 50 (A) 04/03/2022 1011   HGBUR NEGATIVE 04/03/2022 1011   HGBUR small 03/10/2010 1445   BILIRUBINUR NEGATIVE 04/03/2022 1011   BILIRUBINUR negative 04/19/2021 1217   BILIRUBINUR neg 09/30/2020 1117   KETONESUR NEGATIVE 04/03/2022 1011   PROTEINUR NEGATIVE 04/03/2022 1011   UROBILINOGEN 0.2 04/19/2021 1217   UROBILINOGEN 0.2 05/11/2017 1335   NITRITE NEGATIVE 04/03/2022 1011   LEUKOCYTESUR NEGATIVE 04/03/2022 1011    ----------------------------------------------------------------------------------------------------------------   Imaging Results:    No results found.   Assessment & Plan:  Principal Problem:   Sickle cell pain crisis (HCC) Active Problems:   CKD (chronic kidney disease), stage IV (HCC)   Leukocytosis   Symptomatic anemia   Chronic pain syndrome  Sickle cell disease with pain crisis: Admit.  Continue IV fluids, 0.45% saline at 100 mL/h. IV Dilaudid PCA with settings of 0.5 mg, 10-minute lockout, and 3 mg/h. Toradol contraindicated, stage IV chronic kidney disease   Hb Sickle Cell Disease with crisis: Admit patient, start IVF D5 .45% Saline @ 125 mls/hour, start weight based Dilaudid PCA, start IV Toradol 15 mg Q 6 H, Restart oral home pain medications, Monitor vitals very  closely, Re-evaluate pain scale regularly, 2 L of Oxygen by [place], Patient will be re-evaluated for pain in the context of function and relationship to baseline as care progresses. Leukocytosis:  Anemia of Chronic Disease:  Chronic pain Syndrome:    DVT Prophylaxis: Subcut Lovenox   AM Labs Ordered, also please review Full Orders  Family Communication: Admission, patient's condition and plan of care including tests being ordered have been discussed with the patient who indicate understanding and agree with the plan and Code Status.  Code Status: Full Code  Consults called: None    Admission status: Inpatient    Time spent in minutes : 50 minutes  [name]  APRN, MSN, FNP-C Patient Care [facility] [address] [place], [place] [address] [phone]  09/04/2022 at 2:19 PM

## 2022-09-04 NOTE — H&P (Signed)
[facility] History and Physical   Date: 09/04/2022  Patient name: [name] [name]. Medical record number: [id number] Date of birth: [birth year] Age: 42 y.o. Gender: male PCP: Massie Maroon, FNP  Attending physician: Quentin Angst, MD  Chief Complaint: Sickle cell pain   History of Present Illness: [name] [name] is a 42 year old male with a medical history significant for sickle cell disease, chronic pain syndrome, opiate dependence and tolerance, anemia of chronic disease, stage IV chronic kidney disease, and chronic marijuana use presents with complaints of generalized pain that is consistent with his previous crisis.  Patient states that pain intensity has been elevated over the past 4 days and unrelieved by his home medications.  He endorses some dizziness with standing and shortness of breath.  Patient rates pain as 9/10, constant, and throbbing.  Patient states that on last week, he had several episodes of vomiting.  He denies any sick contacts.  He attributes pain crisis to recent GI illness.  He denies any fever, chills, abdominal pain, nausea, or diarrhea.  No urinary symptoms, shortness of breath, or chest pain.  No recent travel or known exposure to COVID-19.  Meds: Medications Prior to Admission  Medication Sig Dispense Refill Last Dose   allopurinol (ZYLOPRIM) 100 MG tablet Take 0.5 tablets (50 mg total) by mouth daily. 30 tablet 0    amitriptyline (ELAVIL) 25 MG tablet Take 2 tablets (50 mg total) by mouth at bedtime. 60 tablet 5    calcitRIOL (ROCALTROL) 0.25 MCG capsule Take 0.25 mcg by mouth daily with breakfast.      colchicine 0.6 MG tablet Take 0.5 tablets (0.3 mg total) by mouth daily. (Patient not taking: Reported on 08/07/2022) 15 tablet 0    Deferiprone, Twice Daily, (FERRIPROX TWICE-A-DAY) 1000 MG TABS Take 3,000 mg by mouth daily. 180 tablet 0    folic acid (FOLVITE) 1 MG tablet TAKE 1 TABLET BY MOUTH EVERY DAY (Patient taking  differently: Take 1 mg by mouth every morning.) 90 tablet 4    hydroxyurea (HYDREA) 500 MG capsule Take 1 capsule by mouth in the morning on Sun/Tues/Wed/Thurs/Sat and 2 capsules on Mon/Fri (Patient taking differently: Take 500-1,000 mg by mouth See admin instructions. Take 500 mg by mouth in the morning on Sun/Tues/Wed/Thurs/Sat and 1,000 mg on Mon/Fri) 260 capsule 1    oxyCODONE (OXYCONTIN) 15 mg 12 hr tablet Take 1 tablet (15 mg) by mouth every 12 hours. 60 tablet 0    Oxycodone HCl 10 MG TABS Take 1 tablet (10 mg total) by mouth every 6 (six) hours as needed. 60 tablet 0    sodium bicarbonate 650 MG tablet Take 1,950 mg by mouth with breakfast, with lunch, and with evening meal.      TYLENOL 8 HOUR ARTHRITIS PAIN 650 MG CR tablet Take 650 mg by mouth every 8 (eight) hours as needed for pain.      VELTASSA 16.8 g PACK Take 16.8 g by mouth daily.      voxelotor (OXBRYTA) 500 MG TABS tablet Take 1,500 mg by mouth daily with breakfast.       Allergies: Nsaids and Morphine and codeine Past Medical History:  Diagnosis Date   Abscess of right iliac fossa 09/24/2018   required Perc Drain 09/24/2018   Arachnoid Cyst of brain bilaterally    "2 really small ones in the back of my head; inside; saw them w/MRI" (09/25/2012)   Bacterial pneumonia ~ 2012   "caught it here in  the hospital" (09/25/2012)   Chronic kidney disease    "from my sickle cell" (09/25/2012)   CKD (chronic kidney disease), stage II    Colitis 04/19/2017   CT scan abd/ pelvis   GERD (gastroesophageal reflux disease)    "after I eat alot of spicey foods" (09/25/2012)   Gynecomastia, male 07/10/2012   History of blood transfusion    "always related to sickle cell crisis" (09/25/2012)   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   Noted in noted from Hematology notes at [name]   Migraines    "take RX qd to prevent them" (09/25/2012)   Opioid dependence with withdrawal (HCC) 08/30/2012   Renal insufficiency    Sickle cell  anemia (HCC)    Sickle cell crisis (HCC) 09/25/2012   Sickle cell nephropathy (HCC) 07/10/2012   Sinus tachycardia    Tachycardia with heart rate 121-140 beats per minute with ambulation 08/04/2016   Past Surgical History:  Procedure Laterality Date   ABCESS DRAINAGE     CHOLECYSTECTOMY  ~ 2012   COLONOSCOPY N/A 04/23/2017   Procedure: COLONOSCOPY;  Surgeon: Hilarie Fredrickson, MD;  Location: [facility];  Service: Endoscopy;  Laterality: N/A;   IR FLUORO GUIDE CV LINE RIGHT  12/17/2016   IR FLUORO GUIDE CV LINE RIGHT  02/07/2021   IR RADIOLOGIST EVAL & MGMT  10/02/2018   IR RADIOLOGIST EVAL & MGMT  10/15/2018   IR REMOVAL TUN CV CATH W/O FL  12/21/2016   IR REMOVAL TUN CV CATH W/O FL  02/11/2021   IR US GUIDE VASC ACCESS RIGHT  12/17/2016   IR US GUIDE VASC ACCESS RIGHT  02/07/2021   spleenectomy     Family History  Problem Relation Age of Onset   Breast cancer Mother    Social History   Socioeconomic History   Marital status: Single    Spouse name: Not on file   Number of children: Not on file   Years of education: Not on file   Highest education level: Not on file  Occupational History   Not on file  Tobacco Use   Smoking status: Some Days    Current packs/day: 0.10    Types: Cigarettes   Smokeless tobacco: Never  Vaping Use   Vaping status: Never Used  Substance and Sexual Activity   Alcohol use: Yes    Comment: Once a month   Drug use: Yes    Types: Marijuana    Comment: Every 2-3 weeks    Sexual activity: Yes    Birth control/protection: Condom  Other Topics Concern   Not on file  Social History Narrative    Lives alone in [place].   Back at school, studying Patent attorney. Unemployed.    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    Social Determinants of Health   Financial Resource Strain: Low Risk  (02/09/2022)    Overall Financial Resource Strain (CARDIA)    Difficulty of Paying Living Expenses: Not hard at all  Food Insecurity: No Food Insecurity (08/03/2022)   Hunger Vital Sign    Worried About Running Out of Food in the Last Year: Never true    Ran Out of Food in the Last Year: Never true  Recent Concern: Food Insecurity - Food Insecurity Present (06/27/2022)   Hunger Vital Sign    Worried About Programme researcher, broadcasting/film/video  in the Last Year: Sometimes true    Ran Out of Food in the Last Year: Sometimes true  Transportation Needs: No Transportation Needs (08/03/2022)   PRAPARE - Administrator, Civil Service (Medical): No    Lack of Transportation (Non-Medical): No  Physical Activity: Sufficiently Active (02/09/2022)   Exercise Vital Sign    Days of Exercise per Week: 7 days    Minutes of Exercise per Session: 30 min  Stress: No Stress Concern Present (02/09/2022)   [employer] of Occupational Health - Occupational Stress Questionnaire    Feeling of Stress : Not at all  Social Connections: Socially Isolated (02/09/2022)   Social Connection and Isolation Panel [NHANES]    Frequency of Communication with Friends and Family: More than three times a week    Frequency of Social Gatherings with Friends and Family: Once a week    Attends Religious Services: Never    Database administrator or Organizations: No    Attends Banker Meetings: Never    Marital Status: Divorced  Catering manager Violence: Not At Risk (08/03/2022)   Humiliation, Afraid, Rape, and Kick questionnaire    Fear of Current or Ex-Partner: No    Emotionally Abused: No    Physically Abused: No    Sexually Abused: No   Review of Systems  Constitutional:  Positive for malaise/fatigue. Negative for chills, diaphoresis and fever.  HENT: Negative.    Eyes: Negative.   Respiratory:  Positive for shortness of breath.   Cardiovascular:  Negative for chest pain, claudication and leg swelling.  Gastrointestinal:   Negative for abdominal pain, blood in stool, diarrhea, melena, nausea and vomiting.  Genitourinary: Negative.  Negative for hematuria.  Musculoskeletal:  Positive for back pain, joint pain and myalgias.  Skin: Negative.   Neurological:  Positive for dizziness.  Endo/Heme/Allergies: Negative.   Psychiatric/Behavioral: Negative.      Physical Exam: Blood pressure 108/86, pulse (!) 120, temperature 98.2 F (36.8 C), temperature source Temporal, resp. rate 16, SpO2 98%. Physical Exam Eyes:     Pupils: Pupils are equal, round, and reactive to light.  Cardiovascular:     Rate and Rhythm: Normal rate and regular rhythm.     Pulses: Normal pulses.  Pulmonary:     Effort: Pulmonary effort is normal.  Abdominal:     General: Bowel sounds are normal.  Skin:    General: Skin is warm.  Neurological:     General: No focal deficit present.     Mental Status: Mental status is at baseline.  Psychiatric:        Mood and Affect: Mood normal.        Thought Content: Thought content normal.        Judgment: Judgment normal.      Lab results: No results found. However, due to the size of the patient record, not all encounters were searched. Please check Results Review for a complete set of results.  Imaging results:  No results found.   Assessment & Plan: Patient admitted to sickle cell day infusion center for management of pain crisis.  Patient is opiate tolerant Initiate IV dilaudid PCA.  IV fluids, 0.45% saline at 100 ml/hr Tylenol 1000 mg by mouth times one dose Review CBC with differential, complete metabolic panel, and reticulocytes as results become available.  Pain intensity will be reevaluated in context of functioning and relationship to baseline as care progresses If pain intensity remains elevated and/or sudden change in hemodynamic stability transition  to inpatient services for higher level of care.     [name]  APRN, MSN, FNP-C Patient Care [facility] [address] [place], [place] [address] [phone]  09/04/2022, 9:36 AM

## 2022-09-05 DIAGNOSIS — D5700 Hb-SS disease with crisis, unspecified: Secondary | ICD-10-CM | POA: Diagnosis not present

## 2022-09-05 LAB — TYPE AND SCREEN: ABO/RH(D): O POS

## 2022-09-05 LAB — CBC
HCT: 26.6 % — ABNORMAL LOW (ref 39.0–52.0)
Hemoglobin: 9.3 g/dL — ABNORMAL LOW (ref 13.0–17.0)
MCH: 38.3 pg — ABNORMAL HIGH (ref 26.0–34.0)
MCHC: 35 g/dL (ref 30.0–36.0)
MCV: 109.5 fL — ABNORMAL HIGH (ref 80.0–100.0)
Platelets: 201 10*3/uL (ref 150–400)
RBC: 2.43 MIL/uL — ABNORMAL LOW (ref 4.22–5.81)
WBC: 12.5 10*3/uL — ABNORMAL HIGH (ref 4.0–10.5)
nRBC: 0.3 % — ABNORMAL HIGH (ref 0.0–0.2)

## 2022-09-05 LAB — BPAM RBC: Blood Product Expiration Date: 202408162359

## 2022-09-05 LAB — BASIC METABOLIC PANEL
Anion gap: 9 (ref 5–15)
BUN: 42 mg/dL — ABNORMAL HIGH (ref 6–20)
CO2: 22 mmol/L (ref 22–32)
Calcium: 8.6 mg/dL — ABNORMAL LOW (ref 8.9–10.3)
Chloride: 104 mmol/L (ref 98–111)
Creatinine, Ser: 2.88 mg/dL — ABNORMAL HIGH (ref 0.61–1.24)
GFR, Estimated: 28 mL/min — ABNORMAL LOW (ref >60)
Glucose, Bld: 102 mg/dL — ABNORMAL HIGH (ref 70–99)
Potassium: 4.9 mmol/L (ref 3.5–5.1)
Sodium: 135 mmol/L (ref 135–145)

## 2022-09-05 MED ORDER — OXYCODONE HCL 5 MG PO TABS
10.0000 mg | ORAL_TABLET | Freq: Four times a day (QID) | ORAL | Status: DC | PRN
  Administered 2022-09-05 (×2): 10 mg via ORAL
  Filled 2022-09-05 (×2): qty 2

## 2022-09-05 MED ORDER — HYDROMORPHONE 1 MG/ML IV SOLN
INTRAVENOUS | Status: DC
  Administered 2022-09-05: 2.5 mg via INTRAVENOUS
  Administered 2022-09-06: 4 mg via INTRAVENOUS
  Administered 2022-09-06: 2 mg via INTRAVENOUS
  Administered 2022-09-06: 3.29 mg via INTRAVENOUS
  Filled 2022-09-05: qty 30

## 2022-09-05 NOTE — Plan of Care (Signed)

## 2022-09-05 NOTE — Progress Notes (Signed)
Subjective: [name] [name] is a 42 year old male with a medical history significant for sickle cell disease, chronic pain syndrome, opiate dependence and tolerance, stage IV chronic kidney disease, and anemia of chronic disease was admitted for symptomatic anemia in the setting of sickle cell pain crisis.  On admission, patient's hemoglobin was 6.4 g/dL, which is below patient's baseline.  He is status post 2 units PRBCs.  Today, hemoglobin has returned to baseline at 9.3 g/dL. Patient rates pain as 7/10 primarily to low back and lower extremities.  He denies any fever, chills, headache, dizziness, shortness of breath, or chest pain.  Objective:  Vital signs in last 24 hours:  Vitals:   09/05/22 0137 09/05/22 0407 09/05/22 0601 09/05/22 0908  BP: 107/81  105/74   Pulse: 81  82   Resp: 18 19  18   Temp: 97.6 F (36.4 C)  98 F (36.7 C)   TempSrc: Oral  Oral   SpO2: 97% 98% 99% 99%  Weight:      Height:        Intake/Output from previous day:   Intake/Output Summary (Last 24 hours) at 09/05/2022 1300 Last data filed at 09/05/2022 0600 Gross per 24 hour  Intake 2629.79 ml  Output 300 ml  Net 2329.79 ml    Physical Exam: General: Alert, awake, oriented x3, in no acute distress.  HEENT: [place]/AT PEERL, EOMI Neck: Trachea midline,  no masses, no thyromegal,y no JVD, no carotid bruit OROPHARYNX:  Moist, No exudate/ erythema/lesions.  Heart: Regular rate and rhythm, murmur present, rubs, gallops, PMI non-displaced, no heaves or thrills on palpation.  Lungs: Clear to auscultation, no wheezing or rhonchi noted. No increased vocal fremitus resonant to percussion  Abdomen: Soft, nontender, nondistended, positive bowel sounds, no masses no hepatosplenomegaly noted..  Neuro: No focal neurological deficits noted cranial nerves II through XII grossly intact. DTRs 2+ bilaterally upper and lower extremities. Strength 5 out of 5 in bilateral upper and lower extremities. Musculoskeletal: No warm  swelling or erythema around joints, no spinal tenderness noted. Psychiatric: Patient alert and oriented x3, good insight and cognition, good recent to remote recall. Lab Results:  Basic Metabolic Panel:    Component Value Date/Time   NA 135 09/05/2022 0757   NA 137 01/24/2022 1224   K 4.9 09/05/2022 0757   CL 104 09/05/2022 0757   CO2 22 09/05/2022 0757   BUN 42 (H) 09/05/2022 0757   BUN 35 (H) 01/24/2022 1224   CREATININE 2.88 (H) 09/05/2022 0757   CREATININE 1.45 (H) 05/12/2014 1430   GLUCOSE 102 (H) 09/05/2022 0757   CALCIUM 8.6 (L) 09/05/2022 0757   CBC:    Component Value Date/Time   WBC 12.5 (H) 09/05/2022 0757   HGB 9.3 (L) 09/05/2022 0757   HGB 9.2 (L) 10/25/2021 1125   HCT 26.6 (L) 09/05/2022 0757   HCT 25.1 (L) 10/25/2021 1125   PLT 201 09/05/2022 0757   PLT 194 10/25/2021 1125   MCV 109.5 (H) 09/05/2022 0757   MCV 107 (H) 10/25/2021 1125   NEUTROABS 7.2 09/04/2022 0952   NEUTROABS 6.6 10/25/2021 1125   LYMPHSABS 3.5 09/04/2022 0952   LYMPHSABS 5.7 (H) 10/25/2021 1125   MONOABS 0.5 09/04/2022 0952   EOSABS 0.0 09/04/2022 0952   EOSABS 0.4 10/25/2021 1125   BASOSABS 0.0 09/04/2022 0952   BASOSABS 0.1 10/25/2021 1125    No results found for this or any previous visit (from the past 240 hour(s)).  Studies/Results: No results found.  Medications: Scheduled Meds:  allopurinol  50 mg Oral Daily   calcitRIOL  0.25 mcg Oral Q breakfast   Deferiprone (Twice Daily)  3,000 mg Oral Daily   folic acid  1 mg Oral Daily   HYDROmorphone   Intravenous Q4H   oxyCODONE  15 mg Oral Q12H   patiromer  16.8 g Oral Daily   senna-docusate  1 tablet Oral BID   sodium bicarbonate  975 mg Oral TID with meals   voxelotor  1,500 mg Oral Q breakfast   Continuous Infusions:  sodium chloride 100 mL/hr at 09/05/22 0414   PRN Meds:.amitriptyline, diphenhydrAMINE, naloxone **AND** sodium chloride flush, ondansetron (ZOFRAN) IV, polyethylene  glycol  Consultants: none  Procedures: none  Antibiotics: none  Assessment/Plan: Principal Problem:   Sickle cell pain crisis (HCC) Active Problems:   CKD (chronic kidney disease), stage IV (HCC)   Leukocytosis   Symptomatic anemia   Chronic pain syndrome  Sickle cell disease with pain crisis: Weaning IV Dilaudid PCA, settings decreased to 0.5 mg, 10-minute lockout, and 2 mg/h. Restarted home medications, oxycodone 10 mg every 6 hours as needed for severe breakthrough pain. OxyContin 15 mg every 12 hours Toradol contraindicated due to stage IV chronic kidney disease. Decrease IV fluids to [facility] Monitor vital signs very closely, reevaluate pain scale regularly, and supplemental oxygen as needed.  Chronic pain syndrome: Continue home medications  Symptomatic anemia: Resolved.  Hemoglobin consistent with baseline at 9.3 g/dL.  Patient status post 2 units PRBCs on yesterday.  Continue to monitor closely.  Labs in AM.  Stage IV chronic kidney disease: Creatinine consistent with baseline.  Continue to avoid nephrotoxins.  Will follow-up with [facility] nephrology as an outpatient.    Code Status: Full Code Family Communication: N/A Disposition Plan: Not yet ready for discharge  Maclaine Ahola Rennis Petty  APRN, MSN, FNP-C [facility] [address] [place], [place] [address] [phone]  If 7PM-7AM, please contact night-coverage.  09/05/2022, 1:00 PM  LOS: 1 day

## 2022-09-05 NOTE — Progress Notes (Signed)
.   Transition of Care [facility]) - Inpatient Brief Assessment   Patient Details  Name: Jeriah Corkum. MRN: [id number] Date of Birth: Oct 23, 1980  Transition of Care [facility]) CM/SW Contact:    Larrie Kass, LCSW Phone Number: 09/05/2022, 11:23 AM   Clinical Narrative:  Transition of Care Department [facility]) has reviewed patient and no TOC needs have been identified at this time. We will continue to monitor patient advancement through interdisciplinary progression rounds. If new patient transition needs arise, please place a TOC consult.  Transition of Care Asessment: Insurance and Status: Insurance coverage has been reviewed Patient has primary care physician: Yes Home environment has been reviewed: yes Prior level of function:: independent Prior/Current Home Services: No current home services Social Determinants of Health Reivew: SDOH reviewed no interventions necessary Readmission risk has been reviewed: Yes Transition of care needs: no transition of care needs at this time

## 2022-09-06 DIAGNOSIS — D5700 Hb-SS disease with crisis, unspecified: Secondary | ICD-10-CM | POA: Diagnosis not present

## 2022-09-06 LAB — BASIC METABOLIC PANEL
Anion gap: 7 (ref 5–15)
BUN: 32 mg/dL — ABNORMAL HIGH (ref 6–20)
CO2: 23 mmol/L (ref 22–32)
Calcium: 8.4 mg/dL — ABNORMAL LOW (ref 8.9–10.3)
Chloride: 105 mmol/L (ref 98–111)
Creatinine, Ser: 2.39 mg/dL — ABNORMAL HIGH (ref 0.61–1.24)
GFR, Estimated: 35 mL/min — ABNORMAL LOW (ref >60)
Glucose, Bld: 93 mg/dL (ref 70–99)
Potassium: 4.5 mmol/L (ref 3.5–5.1)
Sodium: 135 mmol/L (ref 135–145)

## 2022-09-06 LAB — CBC
HCT: 23.1 % — ABNORMAL LOW (ref 39.0–52.0)
Hemoglobin: 8.4 g/dL — ABNORMAL LOW (ref 13.0–17.0)
MCH: 38.9 pg — ABNORMAL HIGH (ref 26.0–34.0)
MCHC: 36.4 g/dL — ABNORMAL HIGH (ref 30.0–36.0)
MCV: 106.9 fL — ABNORMAL HIGH (ref 80.0–100.0)
Platelets: 178 10*3/uL (ref 150–400)
RBC: 2.16 MIL/uL — ABNORMAL LOW (ref 4.22–5.81)
WBC: 10.3 10*3/uL (ref 4.0–10.5)
nRBC: 0.3 % — ABNORMAL HIGH (ref 0.0–0.2)

## 2022-09-06 MED ORDER — OXYCODONE HCL 10 MG PO TABS
10.0000 mg | ORAL_TABLET | ORAL | 0 refills | Status: DC
Start: 2022-09-06 — End: 2022-09-21

## 2022-09-06 NOTE — Progress Notes (Signed)
Patient educated on AVS and discharge instructions.  No questions regarding discharge at this time.  Patient refused to wait in room until ride has arrived and stated that he was walking out and would wait downstairs.  All belongings sent with patient at this time.

## 2022-09-06 NOTE — Discharge Summary (Signed)
Physician Discharge Summary   Patient: [name] [name]. MRN: [id number] DOB: 09-16-1980  Admit date:     09/04/2022  Discharge date: 09/06/2022  Discharge Physician: Lonia Blood   PCP: Massie Maroon, FNP   Recommendations at discharge:   Patient will follow up with PCP  Discharge Diagnoses: Principal Problem:   Sickle cell pain crisis (HCC) Active Problems:   CKD (chronic kidney disease), stage IV (HCC)   Leukocytosis   Symptomatic anemia   Chronic pain syndrome  Resolved Problems:   * No resolved hospital problems. *  Hospital Course: [name] [name] is a 42 year old male with a medical history significant for sickle cell disease, chronic pain syndrome, opiate dependence and tolerance, stage IV chronic kidney disease, and anemia of chronic disease was admitted for symptomatic anemia in the setting of sickle cell pain crisis.   On admission, patient's hemoglobin was 6.4 g/dL, which is below patient's baseline.  He is status post 2 units PRBCs.  Pain also improved from 10 out of 10 to 4 out of 10.  Patient's potassium was high and creatinine is high.  Hemoglobin was 8.4 at time of discharge.  Assessment and Plan: No notes have been filed under this hospital service. Service: Hospitalist        Consultants: None Procedures performed: Blood transfusion, Disposition: Home Diet recommendation:  Regular diet DISCHARGE MEDICATION: Allergies as of 09/06/2022       Reactions   Nsaids Other (See Comments)   Kidney disease   Morphine And Codeine Other (See Comments)   "Really bad headaches"         Medication List     STOP taking these medications    colchicine 0.6 MG tablet       TAKE these medications    allopurinol 100 MG tablet Commonly known as: ZYLOPRIM Take 0.5 tablets (50 mg total) by mouth daily.   amitriptyline 25 MG tablet Commonly known as: ELAVIL Take 2 tablets (50 mg total) by mouth at bedtime.   calcitRIOL 0.25 MCG  capsule Commonly known as: ROCALTROL Take 0.25 mcg by mouth daily with breakfast.   Ferriprox Twice-A-Day 1000 MG Tabs Generic drug: Deferiprone (Twice Daily) Take 3,000 mg by mouth daily.   folic acid 1 MG tablet Commonly known as: FOLVITE TAKE 1 TABLET BY MOUTH EVERY DAY What changed: when to take this   hydroxyurea 500 MG capsule Commonly known as: HYDREA Take 1 capsule by mouth in the morning on Sun/Tues/Wed/Thurs/Sat and 2 capsules on Mon/Fri What changed: additional instructions   Oxbryta 500 MG Tabs tablet Generic drug: voxelotor Take 1,500 mg by mouth daily with breakfast.   oxyCODONE 15 mg 12 hr tablet Commonly known as: OXYCONTIN Take 1 tablet (15 mg) by mouth every 12 hours.   Oxycodone HCl 10 MG Tabs Take 1 tablet (10 mg total) by mouth See admin instructions. Take 10 mg by mouth every 4-6 hours   sodium bicarbonate 650 MG tablet Take 1,950 mg by mouth with breakfast, with lunch, and with evening meal.   Tylenol 8 Hour Arthritis Pain 650 MG CR tablet Generic drug: acetaminophen Take 650 mg by mouth every 8 (eight) hours as needed for pain.   Veltassa 16.8 g Pack Generic drug: Patiromer Sorbitex Calcium Take 16.8 g by mouth daily.        Discharge Exam: Filed Weights   09/04/22 1352  Weight: 49.5 kg   Constitutional: NAD, calm, comfortable Eyes: PERRL, lids and conjunctivae normal ENMT: Mucous membranes are moist. Posterior pharynx  clear of any exudate or lesions.Normal dentition.  Neck: normal, supple, no masses, no thyromegaly Respiratory: clear to auscultation bilaterally, no wheezing, no crackles. Normal respiratory effort. No accessory muscle use.  Cardiovascular: Regular rate and rhythm, no murmurs / rubs / gallops. No extremity edema. 2+ pedal pulses. No carotid bruits.  Abdomen: no tenderness, no masses palpated. No hepatosplenomegaly. Bowel sounds positive.  Musculoskeletal: Good range of motion, no joint swelling or tenderness, Skin: no  rashes, lesions, ulcers. No induration Neurologic: CN 2-12 grossly intact. Sensation intact, DTR normal. Strength 5/5 in all 4.  Psychiatric: Normal judgment and insight. Alert and oriented x 3. Normal mood   Condition at discharge: good  The results of significant diagnostics from this hospitalization (including imaging, microbiology, ancillary and laboratory) are listed below for reference.   Imaging Studies: No results found.  Microbiology: Results for orders placed or performed during the hospital encounter of 04/11/21  Resp Panel by RT-PCR (Flu A&B, Covid) Nasopharyngeal Swab     Status: None   Collection Time: 04/11/21  9:36 AM   Specimen: Nasopharyngeal Swab; Nasopharyngeal(NP) swabs in vial transport medium  Result Value Ref Range Status   SARS Coronavirus 2 by RT PCR NEGATIVE NEGATIVE Final    Comment: (NOTE) SARS-CoV-2 target nucleic acids are NOT DETECTED.  The SARS-CoV-2 RNA is generally detectable in upper respiratory specimens during the acute phase of infection. The lowest concentration of SARS-CoV-2 viral copies this assay can detect is 138 copies/mL. A negative result does not preclude SARS-Cov-2 infection and should not be used as the sole basis for treatment or other patient management decisions. A negative result may occur with  improper specimen collection/handling, submission of specimen other than nasopharyngeal swab, presence of viral mutation(s) within the areas targeted by this assay, and inadequate number of viral copies(<138 copies/mL). A negative result must be combined with clinical observations, patient history, and epidemiological information. The expected result is Negative.  Fact Sheet for Patients:  BloggerCourse.com  Fact Sheet for Healthcare Providers:  SeriousBroker.it  This test is no t yet approved or cleared by the [place] FDA and  has been authorized for detection and/or  diagnosis of SARS-CoV-2 by FDA under an Emergency Use Authorization (EUA). This EUA will remain  in effect (meaning this test can be used) for the duration of the COVID-19 declaration under Section 564(b)(1) of the Act, 21 U.S.C.section 360bbb-3(b)(1), unless the authorization is terminated  or revoked sooner.       Influenza A by PCR NEGATIVE NEGATIVE Final   Influenza B by PCR NEGATIVE NEGATIVE Final    Comment: (NOTE) The Xpert Xpress SARS-CoV-2/FLU/RSV plus assay is intended as an aid in the diagnosis of influenza from Nasopharyngeal swab specimens and should not be used as a sole basis for treatment. Nasal washings and aspirates are unacceptable for Xpert Xpress SARS-CoV-2/FLU/RSV testing.  Fact Sheet for Patients: BloggerCourse.com  Fact Sheet for Healthcare Providers: SeriousBroker.it  This test is not yet approved or cleared by the [place] FDA and has been authorized for detection and/or diagnosis of SARS-CoV-2 by FDA under an Emergency Use Authorization (EUA). This EUA will remain in effect (meaning this test can be used) for the duration of the COVID-19 declaration under Section 564(b)(1) of the Act, 21 U.S.C. section 360bbb-3(b)(1), unless the authorization is terminated or revoked.  Performed at [facility], [address] [place], [place] [address]    *Note: Due to a large number of results and/or encounters for the requested time period,  some results have not been displayed. A complete set of results can be found in Results Review.    Labs: CBC: Recent Labs  Lab 09/04/22 0952 09/05/22 0757 09/06/22 0706  WBC 11.2* 12.5* 10.3  NEUTROABS 7.2  --   --   HGB 6.4* 9.3* 8.4*  HCT 18.6* 26.6* 23.1*  MCV 121.6* 109.5* 106.9*  PLT 187 201 178   Basic Metabolic Panel: Recent Labs  Lab 09/04/22 0952 09/05/22 0757 09/06/22 0706  NA 136 135 135  K 4.3 4.9 4.5  CL 100 104 105  CO2  23 22 23   GLUCOSE 96 102* 93  BUN 55* 42* 32*  CREATININE 3.96* 2.88* 2.39*  CALCIUM 8.5* 8.6* 8.4*   Liver Function Tests: Recent Labs  Lab 09/04/22 0952  AST 34  ALT 33  ALKPHOS 151*  BILITOT 1.3*  PROT 7.2  ALBUMIN 3.4*   CBG: No results for input(s): "GLUCAP" in the last 168 hours.  Discharge time spent: greater than 30 minutes.  SignedLonia Blood, MD [facility] 09/09/2022

## 2022-09-07 ENCOUNTER — Telehealth: Payer: Self-pay

## 2022-09-07 NOTE — Transitions of Care (Post Inpatient/ED Visit) (Signed)
   09/07/2022  Name: [name] [name]. MRN: [id number] DOB: 01-23-1981  Today's TOC FU Call Status: Today's TOC FU Call Status:: Unsuccessul Call (1st Attempt) Unsuccessful Call (1st Attempt) Date: 09/07/22  Attempted to reach the patient regarding the most recent Inpatient/ED visit.  Follow Up Plan: Additional outreach attempts will be made to reach the patient to complete the Transitions of Care (Post Inpatient/ED visit) call.   Signature   Woodfin Ganja LPN [facility] Nurse Health Advisor Direct Dial [phone]

## 2022-09-09 NOTE — Hospital Course (Signed)
[name] [name] is a 42 year old male with a medical history significant for sickle cell disease, chronic pain syndrome, opiate dependence and tolerance, stage IV chronic kidney disease, and anemia of chronic disease was admitted for symptomatic anemia in the setting of sickle cell pain crisis.   On admission, patient's hemoglobin was 6.4 g/dL, which is below patient's baseline.  He is status post 2 units PRBCs.  Pain also improved from 10 out of 10 to 4 out of 10.  Patient's potassium was high and creatinine is high.  Hemoglobin was 8.4 at time of discharge.

## 2022-09-11 NOTE — Transitions of Care (Post Inpatient/ED Visit) (Signed)
09/11/2022  Name: [name] [name]. MRN: [id number] DOB: 01/08/1981  Today's TOC FU Call Status: Today's TOC FU Call Status:: Successful TOC FU Call Competed Unsuccessful Call (1st Attempt) Date: 09/07/22 [facility] FU Call Complete Date: 09/11/22  Transition Care Management Follow-up Telephone Call Date of Discharge: 09/06/22 Discharge Facility: [facility]) Type of Discharge: Inpatient Admission Primary Inpatient Discharge Diagnosis:: Sickle cell pain crisis How have you been since you were released from the hospital?: Better Any questions or concerns?: No  Items Reviewed: Did you receive and understand the discharge instructions provided?: Yes Medications obtained,verified, and reconciled?: Yes (Medications Reviewed) Any new allergies since your discharge?: No Dietary orders reviewed?: Yes Do you have support at home?: Yes  Medications Reviewed Today: Medications Reviewed Today     Reviewed by [name], LPN (Licensed Practical Nurse) on 09/11/22 at [phone]  Med List Status: <None>   Medication Order Taking? Sig Documenting Provider Last Dose Status Informant  allopurinol (ZYLOPRIM) 100 MG tablet [id number] Yes Take 0.5 tablets (50 mg total) by mouth daily. Rometta Emery, MD Taking Active Self  amitriptyline (ELAVIL) 25 MG tablet [id number] Yes Take 2 tablets (50 mg total) by mouth at bedtime. Massie Maroon, FNP Taking Active Self  calcitRIOL (ROCALTROL) 0.25 MCG capsule [id number] Yes Take 0.25 mcg by mouth daily with breakfast. [provider] Taking Active Self  Deferiprone, Twice Daily, (FERRIPROX TWICE-A-DAY) 1000 MG TABS [id number] Yes Take 3,000 mg by mouth daily. Massie Maroon, FNP Taking Active Self           Med Note [name], [name]   Thu Aug 03, 2022  7:31 PM) Patient said he takes "3,000 mg a day"  folic acid (FOLVITE) 1 MG tablet [id number] Yes TAKE 1 TABLET BY MOUTH EVERY DAY  Patient taking differently: Take 1 mg by mouth every morning.    Massie Maroon, FNP Taking Active Self           Med Note (COFFELL, Marzella Schlein   Thu Jun 23, 2021 11:44 AM)    hydroxyurea (HYDREA) 500 MG capsule [id number] Yes Take 1 capsule by mouth in the morning on Sun/Tues/Wed/Thurs/Sat and 2 capsules on Mon/Fri  Patient taking differently: Take 500-1,000 mg by mouth See admin instructions. Take 500 mg by mouth in the morning on Sun/Tues/Wed/Fri/Sat and 1,000 mg on Mon/Thurs   Massie Maroon, FNP Taking Active Self  oxyCODONE (OXYCONTIN) 15 mg 12 hr tablet [id number] Yes Take 1 tablet (15 mg) by mouth every 12 hours. Massie Maroon, FNP Taking Active Self  Oxycodone HCl 10 MG TABS [id number] Yes Take 1 tablet (10 mg total) by mouth See admin instructions. Take 10 mg by mouth every 4-6 hours Rometta Emery, MD Taking Active   sodium bicarbonate 650 MG tablet [id number] Yes Take 1,950 mg by mouth with breakfast, with lunch, and with evening meal. [provider] Taking Active Self           Med Note [name], [name]   Mon Sep 04, 2022  6:57 PM) "1,950 mg three times a day," per the patient  TYLENOL 8 HOUR ARTHRITIS PAIN 650 MG CR tablet [id number] Yes Take 650 mg by mouth every 8 (eight) hours as needed for pain. [provider] Taking Active Self           Med Note Nolon Bussing   Mon Apr 03, 2022  1:32 PM)    VELTASSA 16.8 g PACK [id number] Yes Take 16.8  g by mouth daily. [provider] Taking Active Self           Med Note Nolon Bussing   Mon Apr 03, 2022  1:32 PM)    voxelotor (OXBRYTA) 500 MG TABS tablet [id number] Yes Take 1,500 mg by mouth daily with breakfast. [provider] Taking Active Self            Home Care and Equipment/Supplies: Were Home Health Services Ordered?: No Any new equipment or medical supplies ordered?: No  Functional Questionnaire: Do you need assistance with bathing/showering or dressing?: No Do you need assistance with meal preparation?: No Do you need assistance  with eating?: No Do you have difficulty maintaining continence: No Do you need assistance with getting out of bed/getting out of a chair/moving?: No Do you have difficulty managing or taking your medications?: No  Follow up appointments reviewed: PCP Follow-up appointment confirmed?: Yes Date of PCP follow-up appointment?: 09/14/22 Follow-up Provider: Julianne Handler FNP Specialist [facility] Follow-up appointment confirmed?: No Do you need transportation to your follow-up appointment?: No Do you understand care options if your condition(s) worsen?: Yes-patient verbalized understanding    SIGNATURE  Woodfin Ganja LPN [facility] Nurse Health Advisor Direct Dial [phone]

## 2022-09-14 ENCOUNTER — Telehealth (INDEPENDENT_AMBULATORY_CARE_PROVIDER_SITE_OTHER): Payer: Medicare Other | Admitting: Family Medicine

## 2022-09-14 ENCOUNTER — Encounter: Payer: Self-pay | Admitting: Family Medicine

## 2022-09-14 VITALS — Ht 63.0 in | Wt 113.0 lb

## 2022-09-14 DIAGNOSIS — D571 Sickle-cell disease without crisis: Secondary | ICD-10-CM | POA: Diagnosis not present

## 2022-09-14 DIAGNOSIS — G894 Chronic pain syndrome: Secondary | ICD-10-CM

## 2022-09-14 DIAGNOSIS — Z09 Encounter for follow-up examination after completed treatment for conditions other than malignant neoplasm: Secondary | ICD-10-CM

## 2022-09-14 NOTE — Progress Notes (Signed)
Virtual Visit via Video Note  I connected with [name] on 09/14/22 at  1:20 PM EDT by a video enabled telemedicine application and verified that I am speaking with the correct person using two identifiers.  Location: Patient: Home Provider: [place]   I discussed the limitations of evaluation and management by telemedicine and the availability of in person appointments. The patient expressed understanding and agreed to proceed.  History of Present Illness: [name] [name] is a 42 year old male with a medical history significant for sickle cell disease, stage IV chronic kidney disease, anemia of chronic disease, and opiate dependence and tolerance that presents via video for posthospital follow-up.  Patient was hospitalized from 09/04/2022 - 09/06/2022 for sickle cell pain crisis.  During admission, patient was transfused 2 units PRBCs.  Hemoglobin returned to baseline prior to discharge.  Also, patient's serum creatinine was consistent with his baseline at discharge. Patient followed up with his hematology team at [facility] earlier today.  He states that his labs continue to be consistent with his baseline and no medication changes were made.  Patient also followed up with nephrologist on 09/12/2022. Today, patient is having some lower extremity pain rated 5/10.  He last had pain medication this morning with moderate relief. Patient denies any headache, chest pain, shortness of breath, urinary symptoms, nausea, vomiting, or diarrhea.   Observations/Objective:   Assessment and Plan:  1. Hb-SS disease without crisis [facility]) Patient was evaluated by his hematology team at [facility] this a.m.  No medication changes warranted.  Patient will continue to follow-up as scheduled. He will return to this clinic in 2 weeks to repeat CBC and CMP.  2. Chronic pain syndrome Patient's pain is well-controlled on current medication regimen.  He will continue prescription opiate request per policy.  3.  Hospital discharge follow-up Patient has no further posthospital needs.  Patient will follow-up in 3 months for medication management.  Follow Up Instructions:    I discussed the assessment and treatment plan with the patient. The patient was provided an opportunity to ask questions and all were answered. The patient agreed with the plan and demonstrated an understanding of the instructions.   The patient was advised to call back or seek an in-person evaluation if the symptoms worsen or if the condition fails to improve as anticipated.  I provided 10 minutes of non-face-to-face time during this encounter.  [name]  APRN, MSN, FNP-C Patient Care [facility] [address] [place], [place] [address] [phone]

## 2022-09-16 IMAGING — DX DG KNEE 1-2V*R*
2 series · 3 of 3 positions shown · non-contrast
Comparison: None.

CLINICAL DATA: Right knee pain

EXAM:
RIGHT KNEE - 1-2 VIEW

[knee ap]
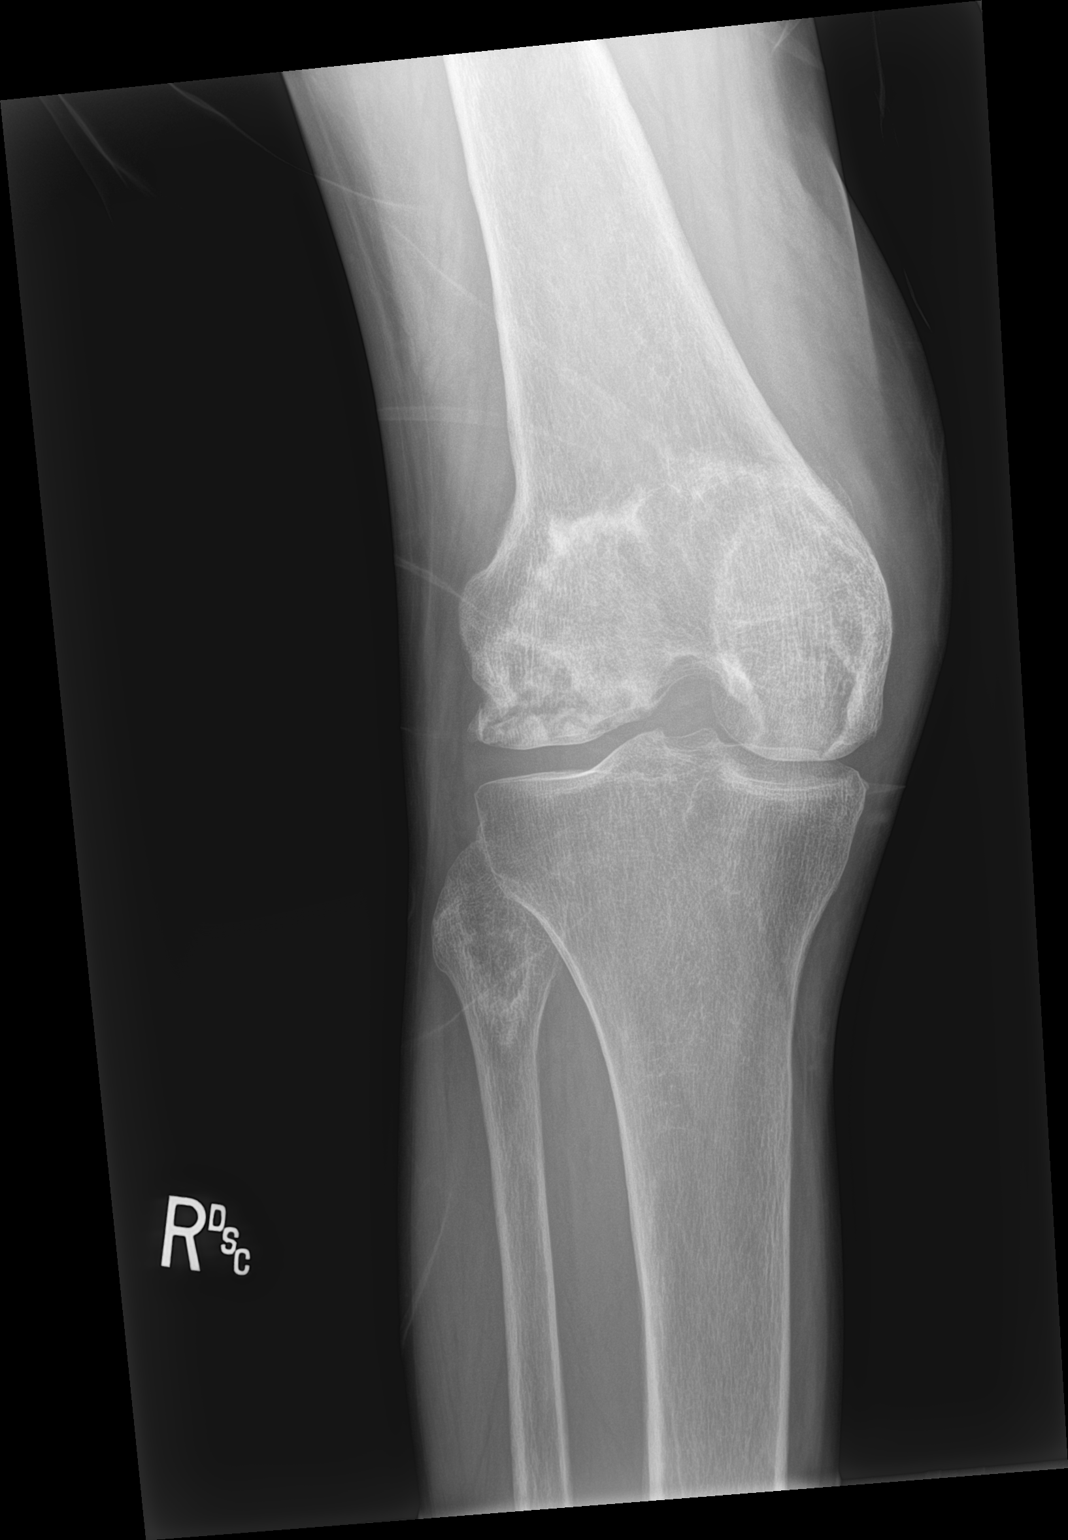

[Series 2: knee lat · 0.14mm/px · 2 of 2 slices shown]
[im 1/2]
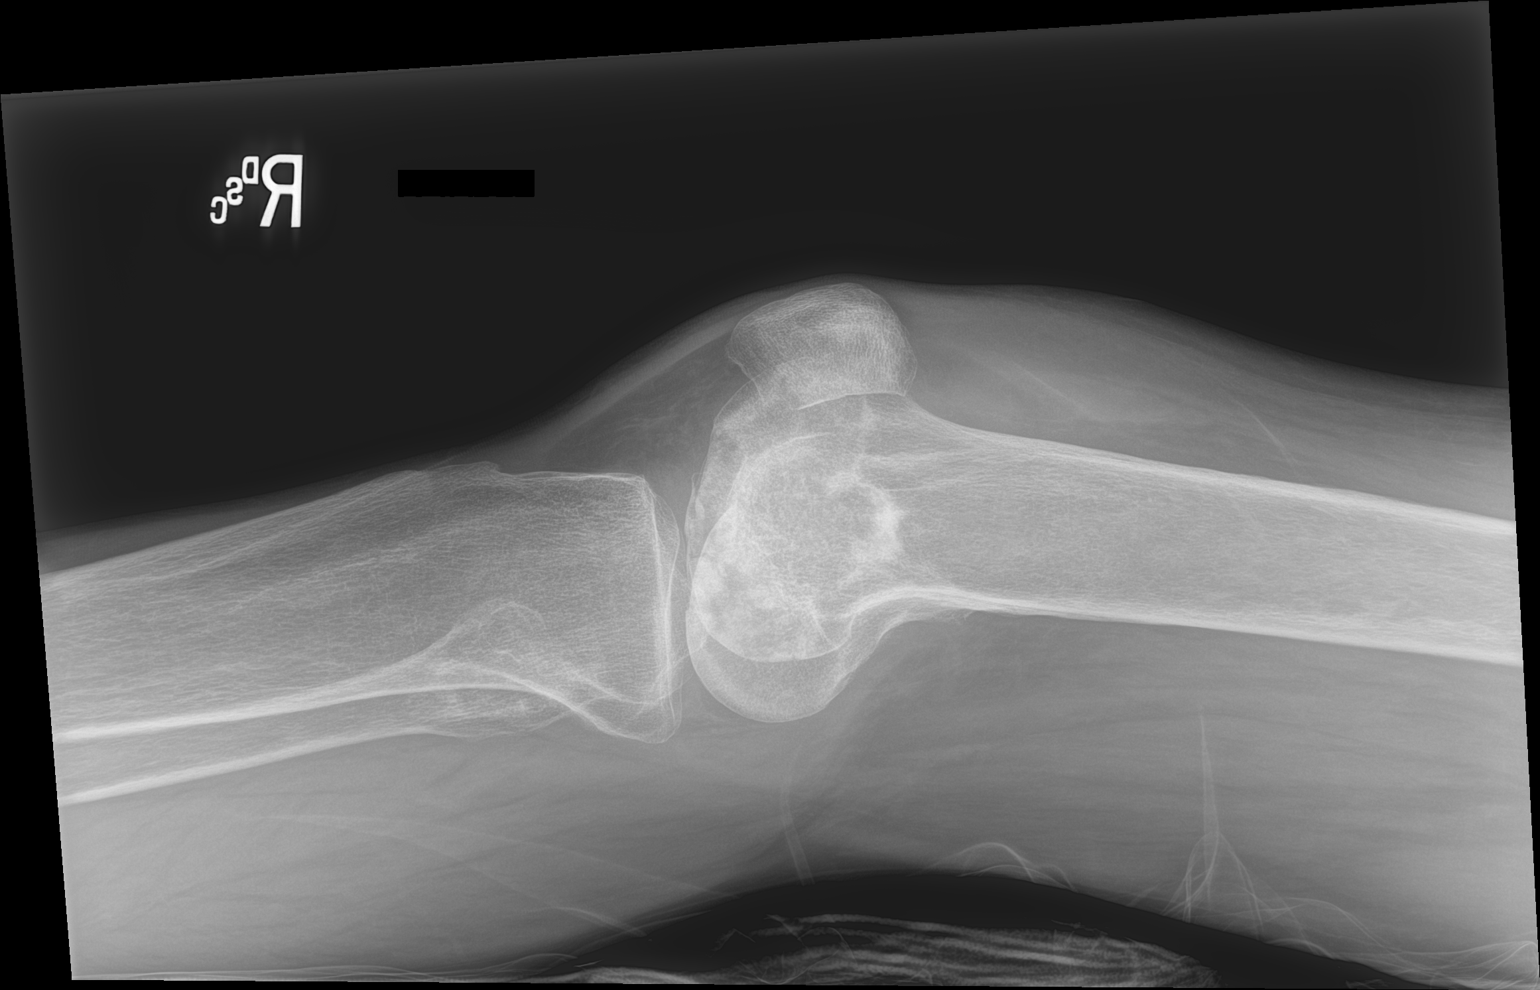
[im 2/2]
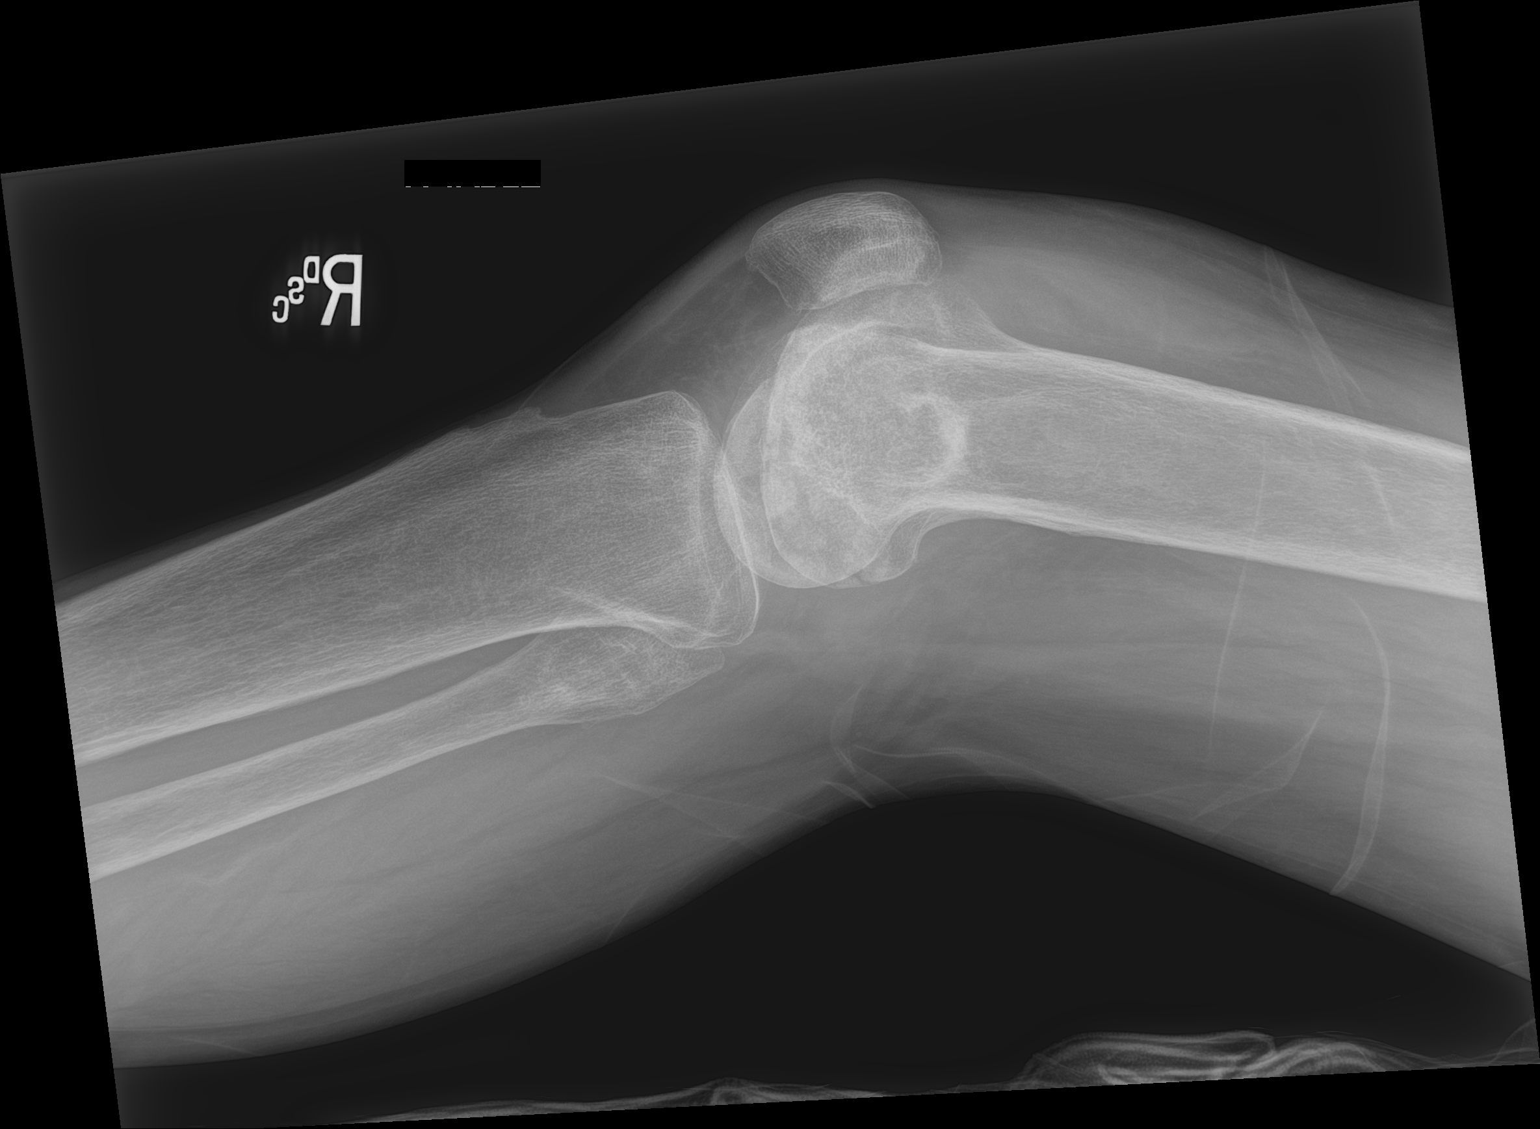

[3 of 3 positions shown; findings below may reference images not displayed]

FINDINGS: There are bone infarcts throughout the distal femur and proximal
fibula likely related to sickle cell disease. Infarct extends to the
chondral surface in the lateral femoral condyle where there is
collapse and likely associated osteochondral lesion/abnormality.
Small joint effusion. No subluxation or dislocation.
IMPRESSION: Extensive bone infarcts with associated osteochondral
lesion/abnormality in the lateral femoral condyle. New

## 2022-09-21 ENCOUNTER — Telehealth: Payer: Self-pay

## 2022-09-21 ENCOUNTER — Other Ambulatory Visit: Payer: Self-pay | Admitting: Family Medicine

## 2022-09-21 DIAGNOSIS — D571 Sickle-cell disease without crisis: Secondary | ICD-10-CM

## 2022-09-21 DIAGNOSIS — G894 Chronic pain syndrome: Secondary | ICD-10-CM

## 2022-09-21 MED ORDER — OXYCODONE HCL 10 MG PO TABS
10.0000 mg | ORAL_TABLET | ORAL | 0 refills | Status: DC
Start: 2022-09-23 — End: 2022-10-10

## 2022-09-21 MED ORDER — OXYCODONE HCL ER 15 MG PO T12A
15.0000 mg | EXTENDED_RELEASE_TABLET | Freq: Two times a day (BID) | ORAL | 0 refills | Status: DC
Start: 2022-09-23 — End: 2022-10-24

## 2022-09-21 NOTE — Progress Notes (Signed)
Reviewed PDMP substance reporting system prior to prescribing opiate medications. No inconsistencies noted.    Meds ordered this encounter  Medications   oxyCODONE (OXYCONTIN) 15 mg 12 hr tablet    Sig: Take 1 tablet (15 mg) by mouth every 12 hours.    Dispense:  60 tablet    Refill:  0    Order Specific Question:   Supervising Provider    Answer:   Quentin Angst [id number]   Oxycodone HCl 10 MG TABS    Sig: Take 1 tablet (10 mg total) by mouth See admin instructions. Take 10 mg by mouth every 4-6 hours    Dispense:  60 tablet    Refill:  0    Order Specific Question:   Supervising Provider    Answer:   Quentin Angst [[id number]]     [name]  APRN, MSN, FNP-C Patient Care [facility] [address] [place], [place] [address] [phone]

## 2022-09-21 NOTE — Telephone Encounter (Signed)
Please advise K[facility] 

## 2022-09-22 ENCOUNTER — Other Ambulatory Visit: Payer: Self-pay | Admitting: Family Medicine

## 2022-09-22 NOTE — Telephone Encounter (Signed)
Pt came into office and ask for some antibiotic for his GUMS  He stated you knew he had surgery

## 2022-09-22 NOTE — Telephone Encounter (Signed)
Caller & Relationship to patient:  MRN #  [id number]   Call Back Number:   Date of Last Office Visit: 09/07/2022     Date of Next Office Visit: 10/24/2022    Medication(s) to be Refilled: Oxycodone, Oxy Contin  Preferred Pharmacy:   ** Please notify patient to allow 48-72 hours to process** **Let patient know to contact pharmacy at the end of the day to make sure medication is ready. ** **If patient has not been seen in a year or longer, book an appointment **Advise to use MyChart for refill requests OR to contact their pharmacy

## 2022-09-26 ENCOUNTER — Ambulatory Visit (INDEPENDENT_AMBULATORY_CARE_PROVIDER_SITE_OTHER): Payer: Medicare Other | Admitting: Family Medicine

## 2022-09-26 ENCOUNTER — Encounter: Payer: Self-pay | Admitting: Family Medicine

## 2022-09-26 VITALS — BP 103/62 | HR 113 | Temp 97.2°F | Wt 115.4 lb

## 2022-09-26 DIAGNOSIS — H9202 Otalgia, left ear: Secondary | ICD-10-CM | POA: Diagnosis not present

## 2022-09-26 DIAGNOSIS — G8929 Other chronic pain: Secondary | ICD-10-CM | POA: Diagnosis not present

## 2022-09-26 DIAGNOSIS — K089 Disorder of teeth and supporting structures, unspecified: Secondary | ICD-10-CM

## 2022-09-26 DIAGNOSIS — K047 Periapical abscess without sinus: Secondary | ICD-10-CM | POA: Diagnosis not present

## 2022-09-26 MED ORDER — AMOXICILLIN 500 MG PO CAPS
500.0000 mg | ORAL_CAPSULE | Freq: Three times a day (TID) | ORAL | 0 refills | Status: AC
Start: 2022-09-26 — End: 2022-10-06
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
## 2022-10-04 ENCOUNTER — Non-Acute Institutional Stay (HOSPITAL_COMMUNITY)
Admission: RE | Admit: 2022-10-04 | Discharge: 2022-10-04 | Disposition: A | Discharge status: Home / Self Care | Payer: Medicare Other | Source: Ambulatory Visit | Attending: Internal Medicine | Admitting: Internal Medicine

## 2022-10-04 ENCOUNTER — Telehealth (HOSPITAL_COMMUNITY): Payer: Self-pay

## 2022-10-04 DIAGNOSIS — D571 Sickle-cell disease without crisis: Secondary | ICD-10-CM | POA: Diagnosis present

## 2022-10-04 DIAGNOSIS — D649 Anemia, unspecified: Secondary | ICD-10-CM

## 2022-10-04 LAB — COMPREHENSIVE METABOLIC PANEL
ALT: 76 U/L — ABNORMAL HIGH (ref 0–44)
AST: 76 U/L — ABNORMAL HIGH (ref 15–41)
Albumin: 3.7 g/dL (ref 3.5–5.0)
Alkaline Phosphatase: 192 U/L — ABNORMAL HIGH (ref 38–126)
Anion gap: 12 (ref 5–15)
BUN: 45 mg/dL — ABNORMAL HIGH (ref 6–20)
CO2: 16 mmol/L — ABNORMAL LOW (ref 22–32)
Calcium: 9.1 mg/dL (ref 8.9–10.3)
Chloride: 105 mmol/L (ref 98–111)
Creatinine, Ser: 2.75 mg/dL — ABNORMAL HIGH (ref 0.61–1.24)
GFR, Estimated: 29 mL/min — ABNORMAL LOW (ref >60)
Glucose, Bld: 96 mg/dL (ref 70–99)
Potassium: 5 mmol/L (ref 3.5–5.1)
Sodium: 133 mmol/L — ABNORMAL LOW (ref 135–145)
Total Bilirubin: 1.1 mg/dL (ref 0.3–1.2)
Total Protein: 7.9 g/dL (ref 6.5–8.1)

## 2022-10-04 LAB — CBC WITH DIFFERENTIAL/PLATELET
Abs Immature Granulocytes: 0.02 10*3/uL (ref 0.00–0.07)
Basophils Absolute: 0 10*3/uL (ref 0.0–0.1)
Basophils Relative: 0 %
Eosinophils Absolute: 0.1 10*3/uL (ref 0.0–0.5)
Eosinophils Relative: 1 %
HCT: 27 % — ABNORMAL LOW (ref 39.0–52.0)
Hemoglobin: 9.2 g/dL — ABNORMAL LOW (ref 13.0–17.0)
Immature Granulocytes: 0 %
Lymphocytes Relative: 53 %
Lymphs Abs: 4.4 10*3/uL — ABNORMAL HIGH (ref 0.7–4.0)
MCH: 40.7 pg — ABNORMAL HIGH (ref 26.0–34.0)
MCHC: 34.1 g/dL (ref 30.0–36.0)
MCV: 119.5 fL — ABNORMAL HIGH (ref 80.0–100.0)
Monocytes Absolute: 0.6 10*3/uL (ref 0.1–1.0)
Monocytes Relative: 7 %
Neutro Abs: 3.2 10*3/uL (ref 1.7–7.7)
Neutrophils Relative %: 39 %
Platelets: 223 10*3/uL (ref 150–400)
RBC: 2.26 MIL/uL — ABNORMAL LOW (ref 4.22–5.81)
RDW: 23.9 % — ABNORMAL HIGH (ref 11.5–15.5)
WBC: 8.2 10*3/uL (ref 4.0–10.5)
nRBC: 0.6 % — ABNORMAL HIGH (ref 0.0–0.2)

## 2022-10-04 LAB — RETICULOCYTES
Immature Retic Fract: 13.4 % (ref 2.3–15.9)
RBC.: 2.25 MIL/uL — ABNORMAL LOW (ref 4.22–5.81)
Retic Count, Absolute: 106.7 10*3/uL (ref 19.0–186.0)
Retic Ct Pct: 4.7 % — ABNORMAL HIGH (ref 0.4–3.1)

## 2022-10-04 NOTE — Progress Notes (Signed)
PATIENT CARE CENTER NOTE:  Diagnosis: sickle cell disease  Provider: Halina Andreas FNP  Procedure: lab draw ( CBC w diff, CMP, Reticulocyte)   Pt seen in the infusion center for lab draw. Pt states his urine is dark and he is feeling like his hemoglobin might be low. Pt denies being in sickle cell crisis. Labs collected peripherally by Phlebotomist [facility] without difficulty. Pt will be notified by phone about lab results and if any further action will be needed, verbalized understanding. Pt alert, oriented and ambulatory accompanied by support person at discharge.

## 2022-10-04 NOTE — Telephone Encounter (Signed)
Spoke with patient, reviewed today's labs with him and pt verbalized understanding.  Per Dr Hyman Hopes pt is to come to the [facility] tomorrow morning as a sickle cell admission, labs will be repeated and if creatinine is elevated , pt is to receive fluids. Pt instructed to call the day hospital tomorrow at 8am for triage, verbalized understanding.

## 2022-10-05 ENCOUNTER — Non-Acute Institutional Stay (HOSPITAL_COMMUNITY)
Admission: AD | Admit: 2022-10-05 | Discharge: 2022-10-05 | Disposition: A | Discharge status: Home / Self Care | Payer: Medicare Other | Source: Ambulatory Visit | Attending: Internal Medicine | Admitting: Internal Medicine

## 2022-10-05 ENCOUNTER — Telehealth (HOSPITAL_COMMUNITY): Payer: Self-pay

## 2022-10-05 DIAGNOSIS — F17210 Nicotine dependence, cigarettes, uncomplicated: Secondary | ICD-10-CM | POA: Diagnosis not present

## 2022-10-05 DIAGNOSIS — D631 Anemia in chronic kidney disease: Secondary | ICD-10-CM | POA: Insufficient documentation

## 2022-10-05 DIAGNOSIS — N183 Chronic kidney disease, stage 3 unspecified: Secondary | ICD-10-CM | POA: Insufficient documentation

## 2022-10-05 DIAGNOSIS — F1290 Cannabis use, unspecified, uncomplicated: Secondary | ICD-10-CM | POA: Diagnosis not present

## 2022-10-05 DIAGNOSIS — D5700 Hb-SS disease with crisis, unspecified: Secondary | ICD-10-CM | POA: Diagnosis present

## 2022-10-05 DIAGNOSIS — K219 Gastro-esophageal reflux disease without esophagitis: Secondary | ICD-10-CM | POA: Insufficient documentation

## 2022-10-05 LAB — LACTATE DEHYDROGENASE: LDH: 151 U/L (ref 98–192)

## 2022-10-05 MED ORDER — POLYETHYLENE GLYCOL 3350 17 G PO PACK: 17.0000 g | PACK | Freq: Every day | ORAL | Status: DC | PRN

## 2022-10-05 MED ORDER — SODIUM CHLORIDE 0.45 % IV SOLN: INTRAVENOUS | Status: DC

## 2022-10-05 MED ORDER — HYDROMORPHONE 1 MG/ML IV SOLN
INTRAVENOUS | Status: DC
  Administered 2022-10-05: 30 mg via INTRAVENOUS
  Administered 2022-10-05: 6.5 mg via INTRAVENOUS
  Filled 2022-10-05: qty 30

## 2022-10-05 MED ORDER — ONDANSETRON HCL 4 MG/2ML IJ SOLN
4.0000 mg | Freq: Four times a day (QID) | INTRAMUSCULAR | Status: DC | PRN
  Administered 2022-10-05: 4 mg via INTRAVENOUS
  Filled 2022-10-05: qty 2

## 2022-10-05 MED ORDER — SENNOSIDES-DOCUSATE SODIUM 8.6-50 MG PO TABS: 1.0000 | ORAL_TABLET | Freq: Two times a day (BID) | ORAL | Status: DC

## 2022-10-05 MED ORDER — NALOXONE HCL 0.4 MG/ML IJ SOLN: 0.4000 mg | INTRAMUSCULAR | Status: DC | PRN

## 2022-10-05 MED ORDER — SODIUM CHLORIDE 0.9% FLUSH: 9.0000 mL | INTRAVENOUS | Status: DC | PRN

## 2022-10-05 MED ORDER — DIPHENHYDRAMINE HCL 25 MG PO CAPS
25.0000 mg | ORAL_CAPSULE | ORAL | Status: DC | PRN
  Administered 2022-10-05: 25 mg via ORAL
  Filled 2022-10-05: qty 1

## 2022-10-05 NOTE — Progress Notes (Signed)
Pt admitted to day hospital today for sickle cell pain treatment. On arrival, pt rates pain 10/10 to bilateral hands and legs. Pt received Dilaudid PCA, IV Zofran, and hydrated with IV fluids via PIV. Pt also received PO Benadryl. At discharge, pt rates pain 5/10. AVS offered, but pt declined. Pt is alert, oriented, and ambulatory at discharge.

## 2022-10-05 NOTE — Telephone Encounter (Signed)
Pt called day hospital wanting to come in today for sickle cell pain treatment. Pt reports 10/10 pain to his hands and legs. Pt reports taking Oxycodone 10 mg and Oxyconting 15 mg at 7 AM. Pt denies being to the ER recently. Pt screened for COVID and denies symptoms and exposures. Pt denies fever, chest pain, N/V/D, abdominal pain, and priapism. Dr. Mikeal Hawthorne notified. Per provider pt can come to day hospital today. Pt notified and verbalized understanding. Pt states that he has transportation without driving himself today.

## 2022-10-05 NOTE — H&P (Incomplete)
History and Physical    Patient: [name] [name]. ZOX:[id number] DOB: [birth year] DOA: 10/05/2022 DOS: the patient was seen and examined on 10/05/2022 PCP: Massie Maroon, FNP  Patient coming from: Home  Chief Complaint: No chief complaint on file.  HPI: [name] Sathre. is a 42 y.o. male with medical history significant of sickle cell disease, chronic kidney disease stage III,  Review of Systems: {ROS_Text:26778} Past Medical History:  Diagnosis Date   Abscess of right iliac fossa 09/24/2018   required Perc Drain 09/24/2018   Arachnoid Cyst of brain bilaterally    "2 really small ones in the back of my head; inside; saw them w/MRI" (09/25/2012)   Bacterial pneumonia ~ 2012   "caught it here in the hospital" (09/25/2012)   Chronic kidney disease    "from my sickle cell" (09/25/2012)   CKD (chronic kidney disease), stage II    Colitis 04/19/2017   CT scan abd/ pelvis   GERD (gastroesophageal reflux disease)    "after I eat alot of spicey foods" (09/25/2012)   Gynecomastia, male 07/10/2012   History of blood transfusion    "always related to sickle cell crisis" (09/25/2012)   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   Noted in noted from Hematology [facility] at [facility]   Migraines    "take RX qd to prevent them" (09/25/2012)   Opioid dependence with withdrawal (HCC) 08/30/2012   Renal insufficiency    Sickle cell anemia (HCC)    Sickle cell crisis (HCC) 09/25/2012   Sickle cell nephropathy (HCC) 07/10/2012   Sinus tachycardia    Tachycardia with heart rate 121-140 beats per minute with ambulation 08/04/2016   Past Surgical History:  Procedure Laterality Date   ABCESS DRAINAGE     CHOLECYSTECTOMY  ~ 2012   COLONOSCOPY N/A 04/23/2017   Procedure: COLONOSCOPY;  Surgeon: Hilarie Fredrickson, MD;  Location: [facility];  Service: Endoscopy;  Laterality: N/A;   IR FLUORO GUIDE CV LINE RIGHT  12/17/2016   IR FLUORO GUIDE CV LINE RIGHT  02/07/2021   IR RADIOLOGIST EVAL &  MGMT  10/02/2018   IR RADIOLOGIST EVAL & MGMT  10/15/2018   IR REMOVAL TUN CV CATH W/O FL  12/21/2016   IR REMOVAL TUN CV CATH W/O FL  02/11/2021   IR US GUIDE VASC ACCESS RIGHT  12/17/2016   IR US GUIDE VASC ACCESS RIGHT  02/07/2021   spleenectomy     Social History:  reports that he has been smoking cigarettes. He has never used smokeless tobacco. He reports current alcohol use. He reports current drug use. Drug: Marijuana.  Allergies  Allergen Reactions   Nsaids Other (See Comments)    Kidney disease   Morphine And Codeine Other (See Comments)    "Really bad headaches"     Family History  Problem Relation Age of Onset   Breast cancer Mother     Prior to Admission medications   Medication Sig Start Date End Date Taking? Authorizing Provider  amitriptyline (ELAVIL) 25 MG tablet Take 2 tablets (50 mg total) by mouth at bedtime. 08/10/22  Yes Massie Maroon, FNP  amoxicillin (AMOXIL) 500 MG capsule Take 1 capsule (500 mg total) by mouth 3 (three) times daily for 10 days. 09/26/22 10/06/22 Yes Massie Maroon, FNP  calcitRIOL (ROCALTROL) 0.25 MCG capsule Take 0.25 mcg by mouth daily with breakfast. 11/16/20  Yes [provider]  Deferiprone, Twice Daily, (FERRIPROX TWICE-A-DAY) 1000 MG TABS Take 3,000 mg by mouth daily. 05/01/22  Yes Massie Maroon, FNP  folic acid (FOLVITE) 1 MG tablet TAKE 1 TABLET BY MOUTH EVERY DAY Patient taking differently: Take 1 mg by mouth every morning. 03/22/20  Yes Massie Maroon, FNP  hydroxyurea (HYDREA) 500 MG capsule Take 1 capsule by mouth in the morning on Sun/Tues/Wed/Thurs/Sat and 2 capsules on Mon/Fri Patient taking differently: Take 500-1,000 mg by mouth See admin instructions. Take 500 mg by mouth in the morning on Sun/Tues/Wed/Fri/Sat and 1,000 mg on Mon/Thurs 05/05/22  Yes Massie Maroon, FNP  oxyCODONE (OXYCONTIN) 15 mg 12 hr tablet Take 1 tablet (15 mg) by mouth every 12 hours. 09/23/22  Yes Massie Maroon, FNP  Oxycodone HCl 10  MG TABS Take 1 tablet (10 mg total) by mouth See admin instructions. Take 10 mg by mouth every 4-6 hours 09/23/22  Yes Massie Maroon, FNP  sodium bicarbonate 650 MG tablet Take 1,950 mg by mouth with breakfast, with lunch, and with evening meal. 11/07/18  Yes [provider]  TYLENOL 8 HOUR ARTHRITIS PAIN 650 MG CR tablet Take 650 mg by mouth every 8 (eight) hours as needed for pain.   Yes [provider]  VELTASSA 16.8 g PACK Take 16.8 g by mouth daily. 10/10/21  Yes [provider]  voxelotor (OXBRYTA) 500 MG TABS tablet Take 1,500 mg by mouth daily with breakfast.   Yes [provider]  allopurinol (ZYLOPRIM) 100 MG tablet Take 0.5 tablets (50 mg total) by mouth daily. Patient not taking: Reported on 09/14/2022 07/01/22   Rometta Emery, MD    Physical Exam: Vitals:   10/05/22 0905  BP: 110/67  Pulse: 96  Resp: 16  Temp: 98.2 F (36.8 C)  TempSrc: Temporal  SpO2: 100%   *** Data Reviewed: {Tip this will not be part of the note when signed- Document your independent interpretation of telemetry tracing, EKG, lab, Radiology test or any other diagnostic tests. Add any new diagnostic test ordered today. (Optional):26781} {Results:26384}  Assessment and Plan: No notes have been filed under this hospital service. Service: Hospitalist     Advance Care Planning:   Code Status: Full Code ***  Consults: ***  Family Communication: ***  Severity of Illness: {Observation/Inpatient:21159}  AuthorLonia Blood, MD 10/05/2022 9:10 AM  For on call review [email or web address].

## 2022-10-10 ENCOUNTER — Other Ambulatory Visit: Payer: Self-pay | Admitting: Family Medicine

## 2022-10-10 DIAGNOSIS — G894 Chronic pain syndrome: Secondary | ICD-10-CM

## 2022-10-10 DIAGNOSIS — D571 Sickle-cell disease without crisis: Secondary | ICD-10-CM

## 2022-10-10 MED ORDER — OXYCODONE HCL 10 MG PO TABS
10.0000 mg | ORAL_TABLET | ORAL | 0 refills | Status: DC
Start: 2022-10-10 — End: 2022-10-24

## 2022-10-10 NOTE — Progress Notes (Signed)
Reviewed PDMP substance reporting system prior to prescribing opiate medications. No inconsistencies noted.  Meds ordered this encounter  Medications   Oxycodone HCl 10 MG TABS    Sig: Take 1 tablet (10 mg total) by mouth See admin instructions. Take 10 mg by mouth every 6 hours    Dispense:  60 tablet    Refill:  0    Order Specific Question:   Supervising Provider    Answer:   Quentin Angst [[id number]]   [name]  APRN, MSN, FNP-C Patient Care [facility] [address] [place], [place] [address] [phone]

## 2022-10-19 ENCOUNTER — Other Ambulatory Visit: Payer: Self-pay

## 2022-10-19 NOTE — Telephone Encounter (Signed)
Please advise Kh 

## 2022-10-20 MED ORDER — HYDROXYUREA 500 MG PO CAPS: 500.0000 mg | ORAL_CAPSULE | ORAL | 1 refills | Status: DC

## 2022-10-21 NOTE — Hospital Course (Signed)
Patient was admitted to sickle cell day hospital.  Initiated on Dilaudid PCA.  No Toradol due to chronic kidney disease.  IV fluids and labs were checked no transfusion was required.  Patient overall did better.  Pain subsided down to 3 out of 10.  Patient felt he was back to baseline.  He was subsequently discharged home to follow-up with PCP.  Also to resume home regiment.

## 2022-10-21 NOTE — Discharge Summary (Signed)
Physician Discharge Summary   Patient: [name] [name]. MRN: [id number] DOB: 27-Dec-1980  Admit date:     10/05/2022  Discharge date: 10/05/2022  Discharge Physician: Lonia Blood   PCP: Massie Maroon, FNP   Recommendations at discharge:   Patient discharged home to continue home regimen.  Overall doing better.  Discharge Diagnoses: Active Problems:   Sickle cell pain crisis (HCC)  Resolved Problems:   * No resolved hospital problems. [facility] Course: Patient was admitted to sickle cell day hospital.  Initiated on Dilaudid PCA.  No Toradol due to chronic kidney disease.  IV fluids and labs were checked no transfusion was required.  Patient overall did better.  Pain subsided down to 3 out of 10.  Patient felt he was back to baseline.  He was subsequently discharged home to follow-up with PCP.  Also to resume home regiment.  Assessment and Plan: No notes have been filed under this hospital service. Service: Hospitalist        Consultants: None Procedures performed: None Disposition: Home Diet recommendation:  Regular diet DISCHARGE MEDICATION: Allergies as of 10/05/2022       Reactions   Nsaids Other (See Comments)   Kidney disease   Morphine And Codeine Other (See Comments)   "Really bad headaches"         Medication List     ASK your doctor about these medications    allopurinol 100 MG tablet Commonly known as: ZYLOPRIM Take 0.5 tablets (50 mg total) by mouth daily.   amitriptyline 25 MG tablet Commonly known as: ELAVIL Take 2 tablets (50 mg total) by mouth at bedtime.   amoxicillin 500 MG capsule Commonly known as: AMOXIL Take 1 capsule (500 mg total) by mouth 3 (three) times daily for 10 days. Ask about: Should I take this medication?   calcitRIOL 0.25 MCG capsule Commonly known as: ROCALTROL Take 0.25 mcg by mouth daily with breakfast.   Ferriprox Twice-A-Day 1000 MG Tabs Generic drug: Deferiprone (Twice Daily) Take 3,000 mg by  mouth daily.   folic acid 1 MG tablet Commonly known as: FOLVITE TAKE 1 TABLET BY MOUTH EVERY DAY   Oxbryta 500 MG Tabs tablet Generic drug: voxelotor Take 1,500 mg by mouth daily with breakfast.   oxyCODONE 15 mg 12 hr tablet Commonly known as: OXYCONTIN Take 1 tablet (15 mg) by mouth every 12 hours.   sodium bicarbonate 650 MG tablet Take 1,950 mg by mouth with breakfast, with lunch, and with evening meal.   Tylenol 8 Hour Arthritis Pain 650 MG CR tablet Generic drug: acetaminophen Take 650 mg by mouth every 8 (eight) hours as needed for pain.   Veltassa 16.8 g Pack Generic drug: Patiromer Sorbitex Calcium Take 16.8 g by mouth daily.        Discharge Exam: There were no vitals filed for this visit. Constitutional: NAD, calm, comfortable Eyes: PERRL, lids and conjunctivae normal ENMT: Mucous membranes are moist. Posterior pharynx clear of any exudate or lesions.Normal dentition.  Neck: normal, supple, no masses, no thyromegaly Respiratory: clear to auscultation bilaterally, no wheezing, no crackles. Normal respiratory effort. No accessory muscle use.  Cardiovascular: Regular rate and rhythm, no murmurs / rubs / gallops. No extremity edema. 2+ pedal pulses. No carotid bruits.  Abdomen: no tenderness, no masses palpated. No hepatosplenomegaly. Bowel sounds positive.  Musculoskeletal: Good range of motion, no joint swelling or tenderness, Skin: no rashes, lesions, ulcers. No induration Neurologic: CN 2-12 grossly intact. Sensation intact, DTR normal. Strength 5/5 in  all 4.  Psychiatric: Normal judgment and insight. Alert and oriented x 3. Normal mood   Condition at discharge: good  The results of significant diagnostics from this hospitalization (including imaging, microbiology, ancillary and laboratory) are listed below for reference.   Imaging Studies: No results found.  Microbiology: Results for orders placed or performed during the hospital encounter of 04/11/21   Resp Panel by RT-PCR (Flu A&B, Covid) Nasopharyngeal Swab     Status: None   Collection Time: 04/11/21  9:36 AM   Specimen: Nasopharyngeal Swab; Nasopharyngeal(NP) swabs in vial transport medium  Result Value Ref Range Status   SARS Coronavirus 2 by RT PCR NEGATIVE NEGATIVE Final    Comment: (NOTE) SARS-CoV-2 target nucleic acids are NOT DETECTED.  The SARS-CoV-2 RNA is generally detectable in upper respiratory specimens during the acute phase of infection. The lowest concentration of SARS-CoV-2 viral copies this assay can detect is 138 copies/mL. A negative result does not preclude SARS-Cov-2 infection and should not be used as the sole basis for treatment or other patient management decisions. A negative result may occur with  improper specimen collection/handling, submission of specimen other than nasopharyngeal swab, presence of viral mutation(s) within the areas targeted by this assay, and inadequate number of viral copies(<138 copies/mL). A negative result must be combined with clinical observations, patient history, and epidemiological information. The expected result is Negative.  Fact Sheet for Patients:  BloggerCourse.com  Fact Sheet for Healthcare Providers:  SeriousBroker.it  This test is no t yet approved or cleared by the [place] FDA and  has been authorized for detection and/or diagnosis of SARS-CoV-2 by FDA under an Emergency Use Authorization (EUA). This EUA will remain  in effect (meaning this test can be used) for the duration of the COVID-19 declaration under Section 564(b)(1) of the Act, 21 U.S.C.section 360bbb-3(b)(1), unless the authorization is terminated  or revoked sooner.       Influenza A by PCR NEGATIVE NEGATIVE Final   Influenza B by PCR NEGATIVE NEGATIVE Final    Comment: (NOTE) The Xpert Xpress SARS-CoV-2/FLU/RSV plus assay is intended as an aid in the diagnosis of influenza from  Nasopharyngeal swab specimens and should not be used as a sole basis for treatment. Nasal washings and aspirates are unacceptable for Xpert Xpress SARS-CoV-2/FLU/RSV testing.  Fact Sheet for Patients: BloggerCourse.com  Fact Sheet for Healthcare Providers: SeriousBroker.it  This test is not yet approved or cleared by the [place] FDA and has been authorized for detection and/or diagnosis of SARS-CoV-2 by FDA under an Emergency Use Authorization (EUA). This EUA will remain in effect (meaning this test can be used) for the duration of the COVID-19 declaration under Section 564(b)(1) of the Act, 21 U.S.C. section 360bbb-3(b)(1), unless the authorization is terminated or revoked.  Performed at [facility], [address], [place], [place] [address]    *Note: Due to a large number of results and/or encounters for the requested time period, some results have not been displayed. A complete set of results can be found in Results Review.    Labs: CBC: No results for input(s): "WBC", "NEUTROABS", "HGB", "HCT", "MCV", "PLT" in the last 168 hours. Basic Metabolic Panel: No results for input(s): "NA", "K", "CL", "CO2", "GLUCOSE", "BUN", "CREATININE", "CALCIUM", "MG", "PHOS" in the last 168 hours. Liver Function Tests: No results for input(s): "AST", "ALT", "ALKPHOS", "BILITOT", "PROT", "ALBUMIN" in the last 168 hours. CBG: No results for input(s): "GLUCAP" in the last 168 hours.  Discharge time spent: less than  30 minutes.  SignedLonia Blood, MD [facility] 10/21/2022

## 2022-10-21 NOTE — H&P (Signed)
History and Physical    Patient: [name] [name]. ZOX:[id number] DOB: September 06, 1980 DOA: 10/05/2022 DOS: the patient was seen and examined on 10/21/2022 PCP: Massie Maroon, FNP  Patient coming from: Home  Chief Complaint: No chief complaint on file.  HPI: [name] [name]. is a 42 y.o. male with medical history significant of sickle cell disease, GERD, chronic kidney disease stage III, anemia of chronic disease who presented to the day hospital with complaint of pain in his arms and legs.  Pain is consistent with his typical sickle cell crisis.  Patient has taken his home regimen this morning but no relief.  He came in for pain control and assessment.  Review of Systems: As mentioned in the history of present illness. All other systems reviewed and are negative. Past Medical History:  Diagnosis Date   Abscess of right iliac fossa 09/24/2018   required Perc Drain 09/24/2018   Arachnoid Cyst of brain bilaterally    "2 really small ones in the back of my head; inside; saw them w/MRI" (09/25/2012)   Bacterial pneumonia ~ 2012   "caught it here in the hospital" (09/25/2012)   Chronic kidney disease    "from my sickle cell" (09/25/2012)   CKD (chronic kidney disease), stage II    Colitis 04/19/2017   CT scan abd/ pelvis   GERD (gastroesophageal reflux disease)    "after I eat alot of spicey foods" (09/25/2012)   Gynecomastia, male 07/10/2012   History of blood transfusion    "always related to sickle cell crisis" (09/25/2012)   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   Noted in noted from H[facility] at [facility]   Migraines    "take RX qd to prevent them" (09/25/2012)   Opioid dependence with withdrawal (HCC) 08/30/2012   Renal insufficiency    Sickle cell anemia (HCC)    Sickle cell crisis (HCC) 09/25/2012   Sickle cell nephropathy (HCC) 07/10/2012   Sinus tachycardia    Tachycardia with heart rate 121-140 beats per minute with ambulation 08/04/2016   Past  Surgical History:  Procedure Laterality Date   ABCESS DRAINAGE     CHOLECYSTECTOMY  ~ 2012   COLONOSCOPY N/A 04/23/2017   Procedure: COLONOSCOPY;  Surgeon: Hilarie Fredrickson, MD;  Location: [facility] ENDOSCOPY;  Service: Endoscopy;  Laterality: N/A;   IR FLUORO GUIDE CV LINE RIGHT  12/17/2016   IR FLUORO GUIDE CV LINE RIGHT  02/07/2021   IR RADIOLOGIST EVAL & MGMT  10/02/2018   IR RADIOLOGIST EVAL & MGMT  10/15/2018   IR REMOVAL TUN CV CATH W/O FL  12/21/2016   IR REMOVAL TUN CV CATH W/O FL  02/11/2021   IR US GUIDE VASC ACCESS RIGHT  12/17/2016   IR US GUIDE VASC ACCESS RIGHT  02/07/2021   spleenectomy     Social History:  reports that he has been smoking cigarettes. He has never used smokeless tobacco. He reports current alcohol use. He reports current drug use. Drug: Marijuana.  Allergies  Allergen Reactions   Nsaids Other (See Comments)    Kidney disease   Morphine And Codeine Other (See Comments)    "Really bad headaches"     Family History  Problem Relation Age of Onset   Breast cancer Mother     Prior to Admission medications   Medication Sig Start Date End Date Taking? Authorizing Provider  amitriptyline (ELAVIL) 25 MG tablet Take 2 tablets (50 mg total) by mouth at bedtime. 08/10/22  Yes Massie Maroon, FNP  calcitRIOL (ROCALTROL) 0.25 MCG capsule Take 0.25 mcg by mouth daily with breakfast. 11/16/20  Yes [provider]  Deferiprone, Twice Daily, (FERRIPROX TWICE-A-DAY) 1000 MG TABS Take 3,000 mg by mouth daily. 05/01/22  Yes Massie Maroon, FNP  folic acid (FOLVITE) 1 MG tablet TAKE 1 TABLET BY MOUTH EVERY DAY Patient taking differently: Take 1 mg by mouth every morning. 03/22/20  Yes Massie Maroon, FNP  oxyCODONE (OXYCONTIN) 15 mg 12 hr tablet Take 1 tablet (15 mg) by mouth every 12 hours. 09/23/22  Yes Massie Maroon, FNP  sodium bicarbonate 650 MG tablet Take 1,950 mg by mouth with breakfast, with lunch, and with evening meal. 11/07/18  Yes [provider]   TYLENOL 8 HOUR ARTHRITIS PAIN 650 MG CR tablet Take 650 mg by mouth every 8 (eight) hours as needed for pain.   Yes [provider]  VELTASSA 16.8 g PACK Take 16.8 g by mouth daily. 10/10/21  Yes [provider]  voxelotor (OXBRYTA) 500 MG TABS tablet Take 1,500 mg by mouth daily with breakfast.   Yes [provider]  allopurinol (ZYLOPRIM) 100 MG tablet Take 0.5 tablets (50 mg total) by mouth daily. Patient not taking: Reported on 09/14/2022 07/01/22   Rometta Emery, MD  hydroxyurea (HYDREA) 500 MG capsule Take 1 capsule by mouth in the morning on Sun/Tues/Wed/Thurs/Sat and 2 capsules on Mon/Fri 10/20/22   Massie Maroon, FNP  Oxycodone HCl 10 MG TABS Take 1 tablet (10 mg total) by mouth See admin instructions. Take 10 mg by mouth every 6 hours 10/10/22   Massie Maroon, FNP    Physical Exam: Vitals:   10/05/22 0905 10/05/22 1138 10/05/22 1331 10/05/22 1528  BP: 110/67 93/62 101/67 118/74  Pulse: 96 91 91 99  Resp: 16 12 14 12   Temp: 98.2 F (36.8 C)     TempSrc: Temporal     SpO2: 100% 100% 100% 100%   Constitutional: NAD, calm, comfortable Eyes: PERRL, lids and conjunctivae normal ENMT: Mucous membranes are moist. Posterior pharynx clear of any exudate or lesions.Normal dentition.  Neck: normal, supple, no masses, no thyromegaly Respiratory: clear to auscultation bilaterally, no wheezing, no crackles. Normal respiratory effort. No accessory muscle use.  Cardiovascular: Regular rate and rhythm, no murmurs / rubs / gallops. No extremity edema. 2+ pedal pulses. No carotid bruits.  Abdomen: no tenderness, no masses palpated. No hepatosplenomegaly. Bowel sounds positive.  Musculoskeletal: Good range of motion, no joint swelling or tenderness, Skin: no rashes, lesions, ulcers. No induration Neurologic: CN 2-12 grossly intact. Sensation intact, DTR normal. Strength 5/5 in all 4.  Psychiatric: Normal judgment and insight. Alert and oriented x 3. Normal  mood  Data Reviewed:  There are no new results to review at this time.  Assessment and Plan:  #1 sickle cell pain crisis: Patient will be admitted to sickle cell day hospital.  Initiate Dilaudid PCA, IV fluids, check labs.  #2 Anemia of Chronic disease: H/H will be checked.    Advance Care Planning:   Code Status: Prior Full  Consults: None  Family Communication: No family at bedside  Severity of Illness: The appropriate patient status for this patient is OBSERVATION. Observation status is judged to be reasonable and necessary in order to provide the required intensity of service to ensure the patient's safety. The patient's presenting symptoms, physical exam findings, and initial radiographic and laboratory data in the context of their medical condition is felt to place them at decreased risk for  further clinical deterioration. Furthermore, it is anticipated that the patient will be medically stable for discharge from the hospital within 2 midnights of admission.   AuthorLonia Blood, MD 10/21/2022 11:49 PM  For on call review [email or web address].

## 2022-10-24 ENCOUNTER — Ambulatory Visit (INDEPENDENT_AMBULATORY_CARE_PROVIDER_SITE_OTHER): Payer: Medicare Other | Admitting: Family Medicine

## 2022-10-24 ENCOUNTER — Encounter: Payer: Self-pay | Admitting: Family Medicine

## 2022-10-24 VITALS — BP 93/66 | HR 106 | Temp 97.1°F | Wt 110.8 lb

## 2022-10-24 DIAGNOSIS — E559 Vitamin D deficiency, unspecified: Secondary | ICD-10-CM | POA: Diagnosis not present

## 2022-10-24 DIAGNOSIS — D571 Sickle-cell disease without crisis: Secondary | ICD-10-CM

## 2022-10-24 DIAGNOSIS — G894 Chronic pain syndrome: Secondary | ICD-10-CM

## 2022-10-24 DIAGNOSIS — N184 Chronic kidney disease, stage 4 (severe): Secondary | ICD-10-CM

## 2022-10-24 MED ORDER — OXYCODONE HCL 10 MG PO TABS: 10.0000 mg | ORAL_TABLET | ORAL | 0 refills | Status: DC

## 2022-10-24 MED ORDER — OXYCODONE HCL ER 15 MG PO T12A: 15.0000 mg | EXTENDED_RELEASE_TABLET | Freq: Two times a day (BID) | ORAL | 0 refills | Status: DC

## 2022-10-24 NOTE — Telephone Encounter (Signed)
Pt came into office ASKED if you can call [facility] to get his med's earlier than the 09/12
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
## 2022-10-28 LAB — DRUG SCREEN 764883 11+OXYCO+ALC+CRT-BUND
Amphetamines, Urine: NEGATIVE ng/mL
BENZODIAZ UR QL: NEGATIVE ng/mL
Barbiturate: NEGATIVE ng/mL
Cannabinoid Quant, Ur: NEGATIVE ng/mL
Cocaine (Metabolite): NEGATIVE ng/mL
Creatinine: 53.6 mg/dL (ref 20.0–300.0)
Ethanol: NEGATIVE %
Meperidine: NEGATIVE ng/mL
Methadone Screen, Urine: NEGATIVE ng/mL
OPIATE SCREEN URINE: NEGATIVE ng/mL
Phencyclidine: NEGATIVE ng/mL
Propoxyphene: NEGATIVE ng/mL
Tramadol: NEGATIVE ng/mL
pH, Urine: 6.1 (ref 4.5–8.9)

## 2022-10-28 LAB — OXYCODONE/OXYMORPHONE, CONFIRM
OXYCODONE/OXYMORPH: POSITIVE — AB
OXYCODONE: 311 ng/mL
OXYCODONE: POSITIVE — AB
OXYMORPHONE (GC/MS): 550 ng/mL
OXYMORPHONE: POSITIVE — AB

## 2022-11-01 LAB — CMP14+CBC/D/PLT+FER+RETIC+V...
ALT: 50 IU/L — ABNORMAL HIGH (ref 0–44)
AST: 60 IU/L — ABNORMAL HIGH (ref 0–40)
Albumin: 4.2 g/dL (ref 4.1–5.1)
Alkaline Phosphatase: 180 IU/L — ABNORMAL HIGH (ref 44–121)
BUN/Creatinine Ratio: 18 (ref 9–20)
BUN: 59 mg/dL — ABNORMAL HIGH (ref 6–20)
Basophils Absolute: 0 10*3/uL (ref 0.0–0.2)
Basos: 0 %
Bilirubin Total: 0.4 mg/dL (ref 0.0–1.2)
Calcium: 9.7 mg/dL (ref 8.7–10.2)
Chloride: 110 mmol/L — ABNORMAL HIGH (ref 96–106)
Creatinine, Ser: 3.23 mg/dL — ABNORMAL HIGH (ref 0.76–1.27)
EOS (ABSOLUTE): 0.1 10*3/uL (ref 0.0–0.4)
Eos: 1 %
Globulin, Total: 3.2 g/dL (ref 1.5–4.5)
Glucose: 109 mg/dL — ABNORMAL HIGH (ref 70–99)
Hematocrit: 34.7 % — ABNORMAL LOW (ref 37.5–51.0)
Hemoglobin: 12.8 g/dL — ABNORMAL LOW (ref 13.0–17.7)
Immature Grans (Abs): 0 10*3/uL (ref 0.0–0.1)
Immature Granulocytes: 0 %
Lymphocytes Absolute: 3.9 10*3/uL — ABNORMAL HIGH (ref 0.7–3.1)
Lymphs: 50 %
MCH: 38.8 pg — ABNORMAL HIGH (ref 26.6–33.0)
MCHC: 36.9 g/dL — ABNORMAL HIGH (ref 31.5–35.7)
MCV: 105 fL — ABNORMAL HIGH (ref 79–97)
Monocytes Absolute: 0.4 10*3/uL (ref 0.1–0.9)
Monocytes: 5 %
Neutrophils Absolute: 3.4 10*3/uL (ref 1.4–7.0)
Neutrophils: 44 %
Platelets: 107 10*3/uL — ABNORMAL LOW (ref 150–450)
Potassium: 6 mmol/L — ABNORMAL HIGH (ref 3.5–5.2)
RBC: 3.3 x10E6/uL — ABNORMAL LOW (ref 4.14–5.80)
RDW: 19.7 % — ABNORMAL HIGH (ref 11.6–15.4)
Retic Ct Pct: 2.9 % — ABNORMAL HIGH (ref 0.6–2.6)
Sodium: 139 mmol/L (ref 134–144)
Total Protein: 7.4 g/dL (ref 6.0–8.5)
Vit D, 25-Hydroxy: 19.3 ng/mL — ABNORMAL LOW (ref 30.0–100.0)
WBC: 7.7 10*3/uL (ref 3.4–10.8)
eGFR: 24 mL/min/{1.73_m2} — ABNORMAL LOW (ref >59)

## 2022-11-01 LAB — INTACT PTH (INCLUDES CALCIUM)
Calcium, Serum: 9.3 mg/dL
PTH (Intact Assay): <3 pg/mL — ABNORMAL LOW

## 2022-11-03 ENCOUNTER — Telehealth: Payer: Self-pay

## 2022-11-03 NOTE — Transitions of Care (Post Inpatient/ED Visit) (Signed)
11/03/2022  Name: [name] [name]. MRN: [id number] DOB: Feb 23, 1980  Today's TOC FU Call Status: Today's TOC FU Call Status:: Successful TOC FU Call Completed TOC FU Call Complete Date: 11/03/22 Patient's Name and Date of Birth confirmed.  Transition Care Management Follow-up Telephone Call Date of Discharge: 11/02/22 Discharge Facility: Other [facility]) Name of Other ([facility]) Discharge Facility: [facility] Type of Discharge: Inpatient Admission Primary Inpatient Discharge Diagnosis:: osteoarthritis R knee, underwent surgery TKA Right How have you been since you were released from the hospital?: Better Any questions or concerns?: No  Items Reviewed: Did you receive and understand the discharge instructions provided?: Yes Medications obtained,verified, and reconciled?: Partial Review Completed Reason for Partial Mediation Review: time constraints of brief call Any new allergies since your discharge?: No Dietary orders reviewed?: NA  Medications Reviewed Today: Medications Reviewed Today     Reviewed by Marcos Eke, RN (Registered Nurse) on 11/03/22 at 1027  Med List Status: <None>   Medication Order Taking? Sig Documenting Provider Last Dose Status Informant  allopurinol (ZYLOPRIM) 100 MG tablet [id number] No Take 0.5 tablets (50 mg total) by mouth daily.  Patient not taking: Reported on 09/14/2022   Rometta Emery, MD Not Taking Active Self  amitriptyline (ELAVIL) 25 MG tablet [id number] No Take 2 tablets (50 mg total) by mouth at bedtime. Massie Maroon, FNP Taking Active Self  calcitRIOL (ROCALTROL) 0.25 MCG capsule [id number] No Take 0.25 mcg by mouth daily with breakfast. [provider] Taking Active Self  Deferiprone, Twice Daily, (FERRIPROX TWICE-A-DAY) 1000 MG TABS [id number] No Take 3,000 mg by mouth daily. Massie Maroon, FNP Taking Active Self           Med Note [name], [name]   Thu Aug 03, 2022  7:31 PM) Patient said he takes  "3,000 mg a day"  folic acid (FOLVITE) 1 MG tablet [id number] No TAKE 1 TABLET BY MOUTH EVERY DAY  Patient taking differently: Take 1 mg by mouth every morning.   Massie Maroon, FNP Taking Active Self           Med Note (COFFELL, Marzella Schlein   Thu Jun 23, 2021 11:44 AM)    hydroxyurea (HYDREA) 500 MG capsule [id number] No Take 1 capsule by mouth in the morning on Sun/Tues/Wed/Thurs/Sat and 2 capsules on Mon/Fri Massie Maroon, FNP Taking Active   oxyCODONE (OXYCONTIN) 15 mg 12 hr tablet [id number]  Take 1 tablet (15 mg) by mouth every 12 hours. Massie Maroon, FNP  Active   Oxycodone HCl 10 MG TABS [id number]  Take 1 tablet (10 mg total) by mouth See admin instructions. Take 10 mg by mouth every 6 hours Massie Maroon, FNP  Active   sodium bicarbonate 650 MG tablet [id number] No Take 1,950 mg by mouth with breakfast, with lunch, and with evening meal. [provider] Taking Active Self           Med Note [name], [name]   Mon Sep 04, 2022  6:57 PM) "1,950 mg three times a day," per the patient  TYLENOL 8 HOUR ARTHRITIS PAIN 650 MG CR tablet [id number] No Take 650 mg by mouth every 8 (eight) hours as needed for pain. [provider] Taking Active Self           Med Note Star Age P   Mon Apr 03, 2022  1:32 PM)    VELTASSA 16.8 g PACK [id number] No Take 16.8 g by mouth daily. [provider] Taking Active Self           Med Note Nolon Bussing   Mon Apr 03, 2022  1:32 PM)    voxelotor (OXBRYTA) 500 MG TABS tablet [id number] No Take 1,500 mg by mouth daily with breakfast. [provider] Taking Active Self            Home Care and Equipment/Supplies: Were Home Health Services Ordered?: No Any new equipment or medical supplies ordered?: Yes Name of Medical supply agency?: [employer] for 3 in 1 bedside commode Were you able to get the equipment/medical supplies?: Yes Do you have any questions related to the use of the equipment/supplies?:  No  Functional Questionnaire: Do you need assistance with bathing/showering or dressing?: No Do you need assistance with meal preparation?: No Do you need assistance with eating?: No Do you have difficulty maintaining continence: No Do you need assistance with getting out of bed/getting out of a chair/moving?: No Do you have difficulty managing or taking your medications?: No  Follow up appointments reviewed: PCP Follow-up appointment confirmed?: No MD Provider Line Number:[phone] Given: No Follow-up Provider: Dr. Julianne Handler Specialist [facility] Follow-up appointment confirmed?: Yes Date of Specialist follow-up appointment?: 11/15/22 Follow-Up Specialty Provider:: First follow up is with Surgery - Soledad Gerlach PA, also has multiple other follow up appts with Specialists including hematology (SS) on 10/22 at [facility] w/ NP Ranitha Dep Do you need transportation to your follow-up appointment?: No Do you understand care options if your condition(s) worsen?: Yes-patient verbalized understanding   Alyse Low, RN, BA, [facility], CRRN [facility] Population Health Care Management Coordinator, Transition of Care Ph # [phone]

## 2022-11-07 ENCOUNTER — Other Ambulatory Visit: Payer: Self-pay | Admitting: Family Medicine

## 2022-11-07 DIAGNOSIS — D571 Sickle-cell disease without crisis: Secondary | ICD-10-CM

## 2022-11-07 DIAGNOSIS — G894 Chronic pain syndrome: Secondary | ICD-10-CM

## 2022-11-07 MED ORDER — OXYCODONE HCL 10 MG PO TABS
10.0000 mg | ORAL_TABLET | ORAL | 0 refills | Status: DC
Start: 2022-11-10 — End: 2022-11-21

## 2022-11-07 NOTE — Progress Notes (Signed)
Reviewed PDMP substance reporting system prior to prescribing opiate medications. No inconsistencies noted.  Meds ordered this encounter  Medications   Oxycodone HCl 10 MG TABS    Sig: Take 1 tablet (10 mg total) by mouth See admin instructions. Take 10 mg by mouth every 6 hours    Dispense:  60 tablet    Refill:  0    Order Specific Question:   Supervising Provider    Answer:   Quentin Angst [[id number]]   [name]  APRN, MSN, FNP-C Patient Care [facility] [address] [place], [place] [address] [phone]

## 2022-11-09 NOTE — Telephone Encounter (Signed)
Caller & Relationship to patient:  MRN #  [id number]   Call Back Number:   Date of Last Office Visit: 10/24/2022     Date of Next Office Visit: 01/23/2023    Medication(s) to be Refilled: Oxycodone   Preferred Pharmacy:   ** Please notify patient to allow 48-72 hours to process** **Let patient know to contact pharmacy at the end of the day to make sure medication is ready. ** **If patient has not been seen in a year or longer, book an appointment **Advise to use MyChart for refill requests OR to contact their pharmacy

## 2022-11-10 ENCOUNTER — Telehealth: Payer: Self-pay | Admitting: Family Medicine

## 2022-11-10 NOTE — Telephone Encounter (Signed)
Spoke to patient about medication Oxbryta. Patient states he is currently taking it as prescribed by [facility]. He will call [facility] on 11/13/22 to get guidance on how to proceed.   Pt requested mychart message with this information. He will follow up with Armenia Hollis after speaking to Hematology.

## 2022-11-13 ENCOUNTER — Institutional Professional Consult (permissible substitution) (INDEPENDENT_AMBULATORY_CARE_PROVIDER_SITE_OTHER): Payer: Medicare Other | Admitting: Otolaryngology

## 2022-11-16 ENCOUNTER — Institutional Professional Consult (permissible substitution) (INDEPENDENT_AMBULATORY_CARE_PROVIDER_SITE_OTHER): Payer: Medicare Other | Admitting: Otolaryngology

## 2022-11-21 ENCOUNTER — Other Ambulatory Visit: Payer: Self-pay | Admitting: Family Medicine

## 2022-11-21 ENCOUNTER — Other Ambulatory Visit: Payer: Self-pay

## 2022-11-21 DIAGNOSIS — D571 Sickle-cell disease without crisis: Secondary | ICD-10-CM

## 2022-11-21 DIAGNOSIS — G894 Chronic pain syndrome: Secondary | ICD-10-CM

## 2022-11-21 MED ORDER — OXYCODONE HCL 10 MG PO TABS: 10.0000 mg | ORAL_TABLET | ORAL | 0 refills | Status: DC

## 2022-11-21 MED ORDER — OXYCODONE HCL ER 15 MG PO T12A: 15.0000 mg | EXTENDED_RELEASE_TABLET | Freq: Two times a day (BID) | ORAL | 0 refills | Status: DC

## 2022-11-21 NOTE — Progress Notes (Signed)
Reviewed PDMP substance reporting system prior to prescribing opiate medications. No inconsistencies noted.  Meds ordered this encounter  Medications   Oxycodone HCl 10 MG TABS    Sig: Take 1 tablet (10 mg total) by mouth See admin instructions. Take 10 mg by mouth every 6 hours    Dispense:  60 tablet    Refill:  0    Order Specific Question:   Supervising Provider    Answer:   Quentin Angst [[id number]]   [name]  APRN, MSN, FNP-C Patient Care [facility] [address] [place], [place] [address] [phone]

## 2022-11-21 NOTE — Progress Notes (Signed)
Reviewed PDMP substance reporting system prior to prescribing opiate medications. No inconsistencies noted.  Meds ordered this encounter  Medications   oxyCODONE (OXYCONTIN) 15 mg 12 hr tablet    Sig: Take 1 tablet (15 mg) by mouth every 12 hours.    Dispense:  60 tablet    Refill:  0    Order Specific Question:   Supervising Provider    Answer:   Quentin Angst [[id number]]   [name]  APRN, MSN, FNP-C Patient Care [name] [address] [place], [place] [address] [phone]

## 2022-11-21 NOTE — Telephone Encounter (Signed)
Please advise K[facility] 

## 2022-11-22 NOTE — Telephone Encounter (Signed)
Caller & Relationship to patient:  MRN #  [id number]   Call Back Number:   Date of Last Office Visit: 11/21/2022     Date of Next Office Visit: 01/23/2023    Medication(s) to be Refilled: Oxycotin 15 DUE 10/11  Preferred Pharmacy:   ** Please notify patient to allow 48-72 hours to process** **Let patient know to contact pharmacy at the end of the day to make sure medication is ready. ** **If patient has not been seen in a year or longer, book an appointment **Advise to use MyChart for refill requests OR to contact their pharmacy

## 2022-11-23 ENCOUNTER — Other Ambulatory Visit: Payer: Self-pay | Admitting: Family Medicine

## 2022-11-28 ENCOUNTER — Ambulatory Visit (INDEPENDENT_AMBULATORY_CARE_PROVIDER_SITE_OTHER): Payer: Medicare Other | Admitting: Otolaryngology

## 2022-11-28 ENCOUNTER — Encounter (INDEPENDENT_AMBULATORY_CARE_PROVIDER_SITE_OTHER): Payer: Self-pay

## 2022-11-28 VITALS — Ht 63.0 in

## 2022-11-28 DIAGNOSIS — K089 Disorder of teeth and supporting structures, unspecified: Secondary | ICD-10-CM | POA: Diagnosis not present

## 2022-11-28 DIAGNOSIS — K1379 Other lesions of oral mucosa: Secondary | ICD-10-CM

## 2022-11-28 DIAGNOSIS — G8929 Other chronic pain: Secondary | ICD-10-CM

## 2022-11-28 IMAGING — CR DG CHEST 2V
2 series · 2 of 2 positions shown · non-contrast
Comparison: 02/06/2021

CLINICAL DATA: Sickle cell disease. Complains of sickle cell pain
and bilateral arms and shortness of breath.

EXAM:
CHEST - 2 VIEW

[w chest lat]
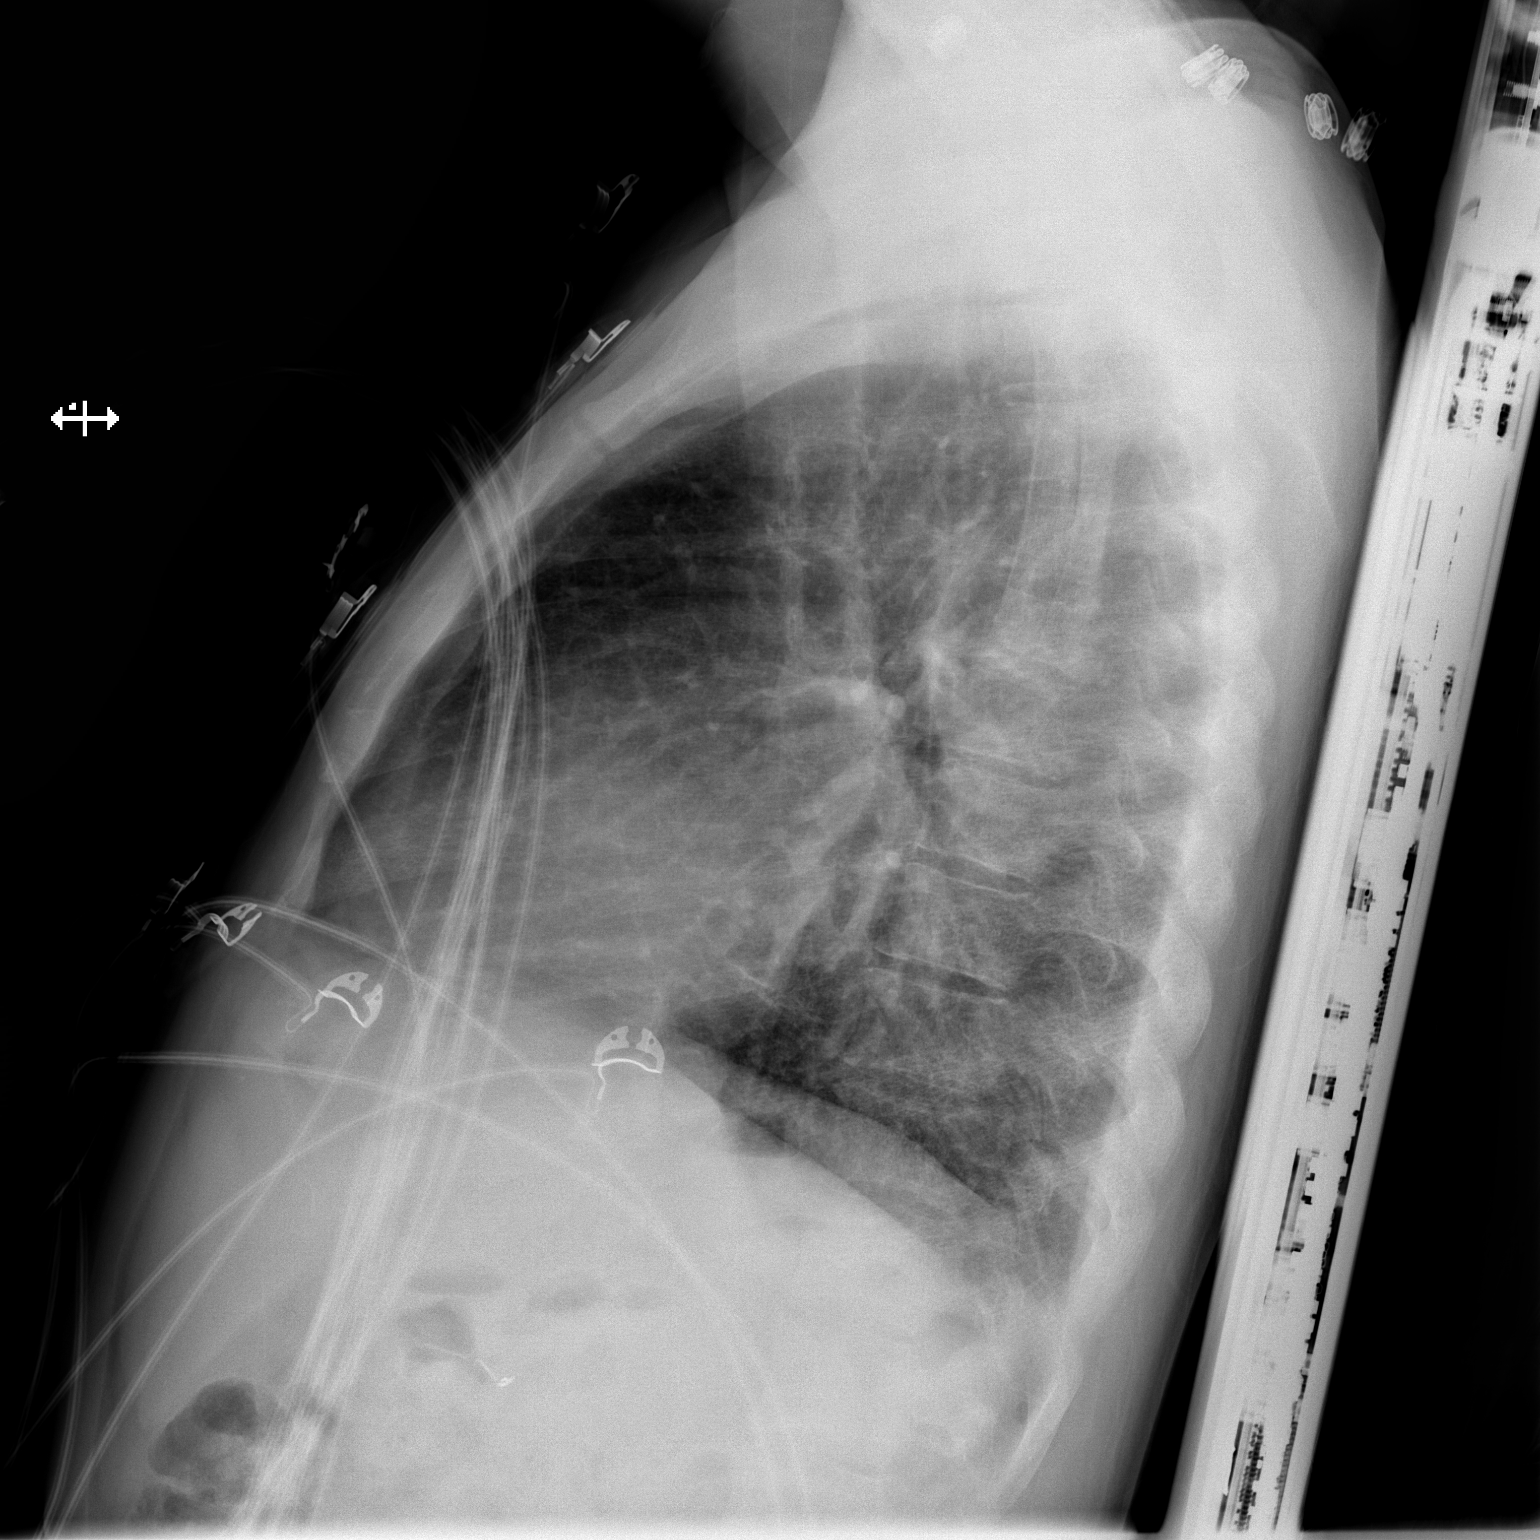

[x chest ap]
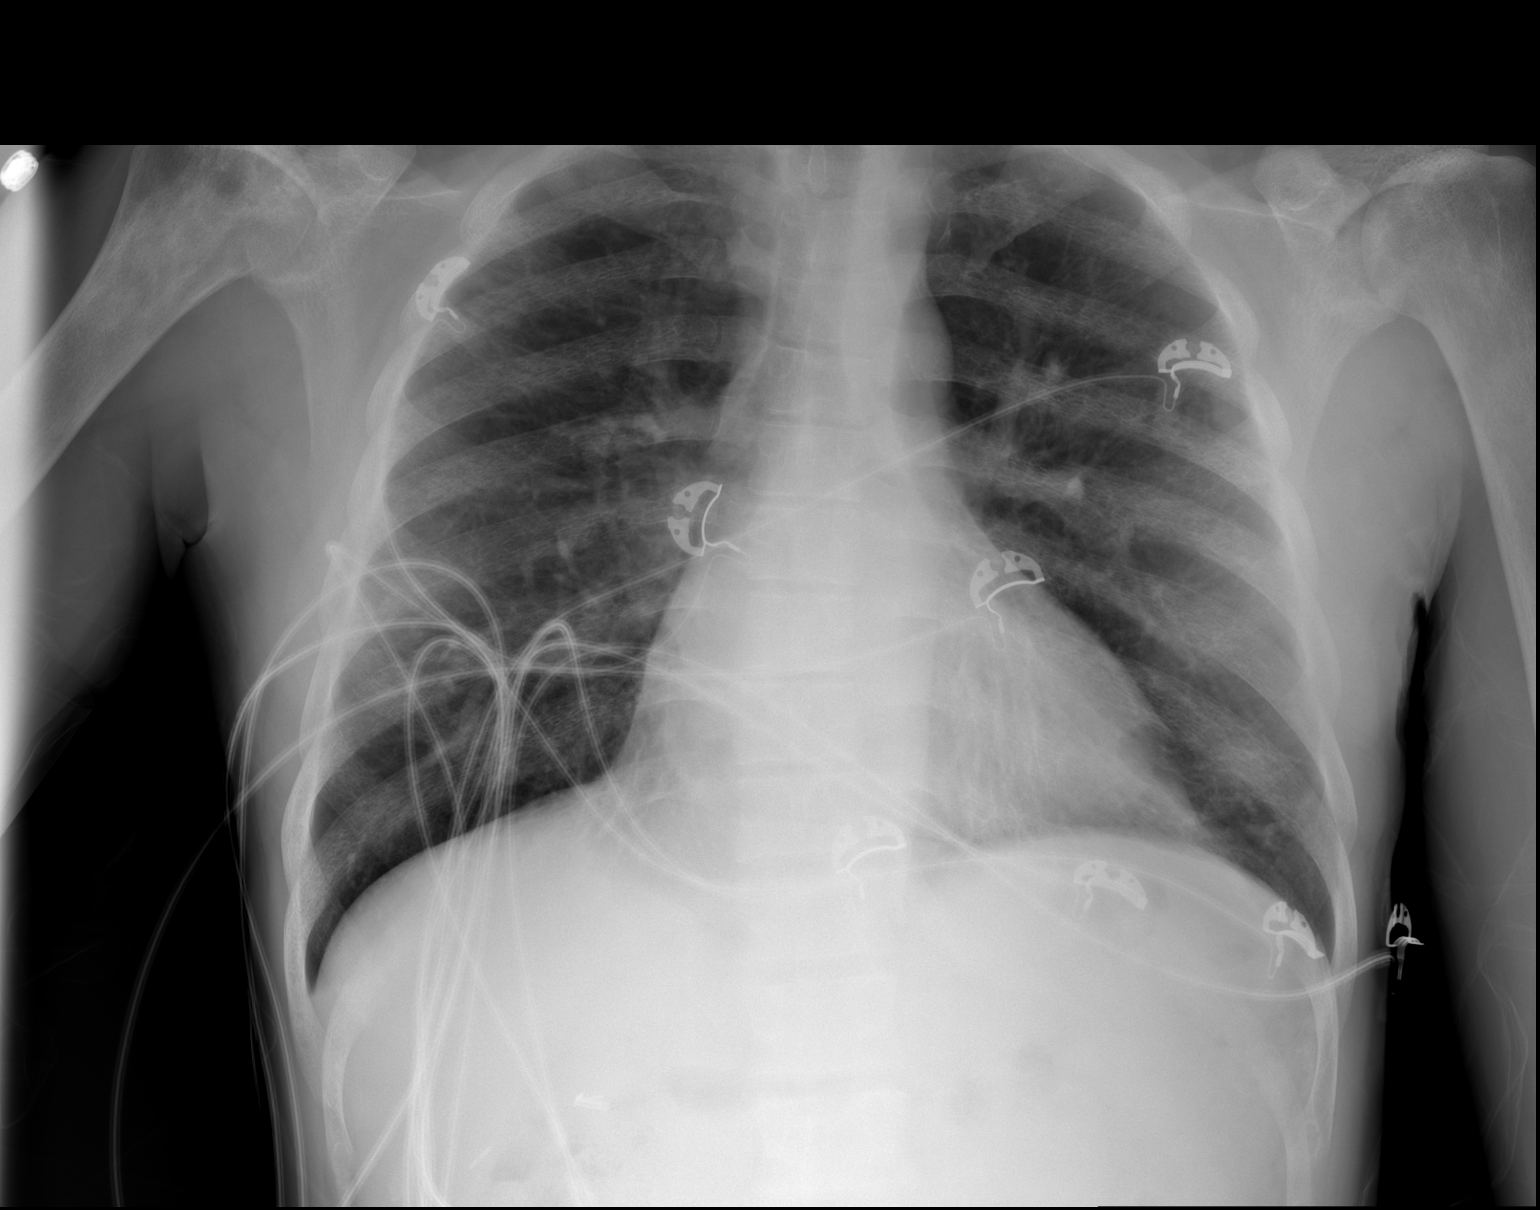

[2 of 2 positions shown; findings below may reference images not displayed]

FINDINGS: Stable cardiomediastinal contours. No signs of pleural effusion or
edema. Chronic scarring is identified within the retrocardiac left
lung base. No superimposed airspace consolidation. Bony stigmata of
sickle cell disease is again noted with diffuse osseous sclerosis
and avascular necrosis within both humeral heads.
IMPRESSION: No acute cardiopulmonary abnormalities.

Chronic changes of sickle cell disease as described above.

## 2022-11-28 NOTE — Progress Notes (Unsigned)
Dear Dr. Hart Rochester, Here is my assessment for our mutual patient, [name] [name]. Thank you for allowing me the opportunity to care for your patient. Please do not hesitate to contact me should you have any other questions. Sincerely, Dr. Jovita Kussmaul  Otolaryngology Clinic Note Referring provider: Dr. Hart Rochester HPI:  [name] [name] is a 42 y.o. male kindly referred by Dr. Hart Rochester for evaluation of left oral cavity pain.Marland Kitchen  PMHx: sickle cell disease, chronic pain syndrome, opiate dependence and tolerance, stage IV chronic kidney disease and anemia of chronic disease  Reports that he has had left dental pain and oral cavity pain for several months. Hurts to chew, otherwise denies any oral cavity masses, neck masses, odynophagia, dysphagia, hemoptysis, ear pain, facial swelling, or other issues. Of note, he saw Dentistry recently for this and they noted issues with #10; reported he needed dental work and attributed pain to it, but could not clean because of the bone loss on xray. No abscesses. Some referred ear pain but otherwise no otologic symptoms or history Was prescribed amoxicillin but did not help. He's not on a bisphosphonate    PMH/Meds/All/SocHx/FamHx/ROS:   Past Medical History:  Diagnosis Date   Abscess of right iliac fossa 09/24/2018   required Perc Drain 09/24/2018   Arachnoid Cyst of brain bilaterally    "2 really small ones in the back of my head; inside; saw them w/MRI" (09/25/2012)   Bacterial pneumonia ~ 2012   "caught it here in the hospital" (09/25/2012)   Chronic kidney disease    "from my sickle cell" (09/25/2012)   CKD (chronic kidney disease), stage II    Colitis 04/19/2017   CT scan abd/ pelvis   GERD (gastroesophageal reflux disease)    "after I eat alot of spicey foods" (09/25/2012)   Gynecomastia, male 07/10/2012   History of blood transfusion    "always related to sickle cell crisis" (09/25/2012)   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   Noted in noted from  H[facility]ology notes at [facility]   Migraines    "take RX qd to prevent them" (09/25/2012)   Opioid dependence with withdrawal (HCC) 08/30/2012   Renal insufficiency    Sickle cell anemia (HCC)    Sickle cell crisis (HCC) 09/25/2012   Sickle cell nephropathy (HCC) 07/10/2012   Sinus tachycardia    Tachycardia with heart rate 121-140 beats per minute with ambulation 08/04/2016    Past Surgical History:  Procedure Laterality Date   ABCESS DRAINAGE     CHOLECYSTECTOMY  ~ 2012   COLONOSCOPY N/A 04/23/2017   Procedure: COLONOSCOPY;  Surgeon: Hilarie Fredrickson, MD;  Location: [facility];  Service: Endoscopy;  Laterality: N/A;   IR FLUORO GUIDE CV LINE RIGHT  12/17/2016   IR FLUORO GUIDE CV LINE RIGHT  02/07/2021   IR RADIOLOGIST EVAL & MGMT  10/02/2018   IR RADIOLOGIST EVAL & MGMT  10/15/2018   IR REMOVAL TUN CV CATH W/O FL  12/21/2016   IR REMOVAL TUN CV CATH W/O FL  02/11/2021   IR US GUIDE VASC ACCESS RIGHT  12/17/2016   IR US GUIDE VASC ACCESS RIGHT  02/07/2021   spleenectomy     Denies history of head or neck surgery  Family History  Problem Relation Age of Onset   Breast cancer Mother    No family history of bleeding disorders or difficulty with anesthesia  Social Connections: Socially Isolated (02/09/2022)   Social Connection and Isolation Panel [NHANES]    Frequency of Communication with Friends and  Family: More than three times a week    Frequency of Social Gatherings with Friends and Family: Once a week    Attends Religious Services: Never    Database administrator or Organizations: No    Attends Banker Meetings: Never    Marital Status: Divorced      Current Outpatient Medications:    amitriptyline (ELAVIL) 25 MG tablet, Take 2 tablets (50 mg total) by mouth at bedtime., Disp: 60 tablet, Rfl: 5   calcitRIOL (ROCALTROL) 0.25 MCG capsule, Take 0.25 mcg by mouth daily with breakfast., Disp: , Rfl:    Deferiprone, Twice Daily, (FERRIPROX TWICE-A-DAY) 1000 MG  TABS, Take 3,000 mg by mouth daily., Disp: 180 tablet, Rfl: 0   folic acid (FOLVITE) 1 MG tablet, TAKE 1 TABLET BY MOUTH EVERY DAY (Patient taking differently: Take 1 mg by mouth every morning.), Disp: 90 tablet, Rfl: 4   hydroxyurea (HYDREA) 500 MG capsule, Take 1 capsule by mouth in the morning on Sun/Tues/Wed/Thurs/Sat and 2 capsules on Mon/Fri, Disp: 260 capsule, Rfl: 1   oxyCODONE (OXYCONTIN) 15 mg 12 hr tablet, Take 1 tablet (15 mg) by mouth every 12 hours., Disp: 60 tablet, Rfl: 0   Oxycodone HCl 10 MG TABS, Take 1 tablet (10 mg total) by mouth See admin instructions. Take 10 mg by mouth every 6 hours, Disp: 60 tablet, Rfl: 0   sodium bicarbonate 650 MG tablet, Take 1,950 mg by mouth with breakfast, with lunch, and with evening meal., Disp: , Rfl:    TYLENOL 8 HOUR ARTHRITIS PAIN 650 MG CR tablet, Take 650 mg by mouth every 8 (eight) hours as needed for pain., Disp: , Rfl:    VELTASSA 16.8 g PACK, Take 16.8 g by mouth daily., Disp: , Rfl:    voxelotor (OXBRYTA) 500 MG TABS tablet, Take 1,500 mg by mouth daily with breakfast., Disp: , Rfl:    allopurinol (ZYLOPRIM) 100 MG tablet, Take 0.5 tablets (50 mg total) by mouth daily. (Patient not taking: Reported on 09/14/2022), Disp: 30 tablet, Rfl: 0     Physical Exam:   Ht 5\' 3"  (1.6 m)   BMI 19.63 kg/m    Salient findings:  CN II-XII intact  Bilateral EAC clear and TM intact with well pneumatized middle ear spaces Anterior rhinoscopy: Septum relatively midline; no masses No lesions of oral cavity/oropharynx; dentition poor with multiple caries on left maxillary and mandibular teeth; exposed root left maxillary molar and tender to palpatoin; otherwise no fluctuance or oral cavity masses No obviously palpable neck masses/lymphadenopathy/thyromegaly No respiratory distress or stridor  Independent Review of Additional Tests or Records:  PCP notes and dentistry notes (which he brought with him) were reviewed and summarized  Procedures:  None  today  Impression & Plans:  [name] [name] is a 42 y.o. male with: Left oral cavity pain - suspect dental in origin. Do not think related to TMJ or other etiology based on benign exam besides dental exam. Do think ear pain is referred as he denies any other symptoms - I do think that he needs dentistry eval and treatment and did refer him; he will call to get appointment; happy to answer any questions as they arise   - f/u as needed; return precautions discussed   Thank you for allowing me the opportunity to care for your patient. Please do not hesitate to contact me should you have any other questions.  Sincerely, Jovita Kussmaul, MD Otolarynoglogist (ENT), [facility] ENT Specialist Phone: [phone] Fax: [phone]  11/30/2022, 9:42 AM

## 2022-11-30 ENCOUNTER — Encounter (INDEPENDENT_AMBULATORY_CARE_PROVIDER_SITE_OTHER): Payer: Self-pay | Admitting: Otolaryngology

## 2022-12-05 ENCOUNTER — Non-Acute Institutional Stay (HOSPITAL_COMMUNITY)
Admission: RE | Admit: 2022-12-05 | Discharge: 2022-12-05 | Disposition: A | Discharge status: Home / Self Care | Payer: Medicare Other | Source: Ambulatory Visit | Attending: Internal Medicine | Admitting: Internal Medicine

## 2022-12-12 ENCOUNTER — Other Ambulatory Visit: Payer: Self-pay

## 2022-12-12 DIAGNOSIS — D571 Sickle-cell disease without crisis: Secondary | ICD-10-CM

## 2022-12-12 DIAGNOSIS — G894 Chronic pain syndrome: Secondary | ICD-10-CM

## 2022-12-12 NOTE — Telephone Encounter (Signed)
Please advise K[facility] 

## 2022-12-12 NOTE — Telephone Encounter (Signed)
Caller & Relationship to patient:  MRN #  [id number]   Call Back Number:   Date of Last Office Visit: 11/21/2022     Date of Next Office Visit: 01/23/2023    Medication(s) to be Refilled: Oxycodone   Preferred Pharmacy:   ** Please notify patient to allow 48-72 hours to process** **Let patient know to contact pharmacy at the end of the day to make sure medication is ready. ** **If patient has not been seen in a year or longer, book an appointment **Advise to use MyChart for refill requests OR to contact their pharmacy

## 2022-12-13 ENCOUNTER — Other Ambulatory Visit: Payer: Self-pay

## 2022-12-13 ENCOUNTER — Other Ambulatory Visit: Payer: Self-pay | Admitting: Family Medicine

## 2022-12-13 ENCOUNTER — Non-Acute Institutional Stay (HOSPITAL_COMMUNITY)
Admission: RE | Admit: 2022-12-13 | Discharge: 2022-12-13 | Disposition: A | Discharge status: Home / Self Care | Payer: Medicare Other | Source: Ambulatory Visit | Attending: Internal Medicine | Admitting: Internal Medicine

## 2022-12-13 DIAGNOSIS — D5700 Hb-SS disease with crisis, unspecified: Secondary | ICD-10-CM | POA: Diagnosis present

## 2022-12-13 DIAGNOSIS — D571 Sickle-cell disease without crisis: Secondary | ICD-10-CM

## 2022-12-13 DIAGNOSIS — G894 Chronic pain syndrome: Secondary | ICD-10-CM

## 2022-12-13 LAB — CBC WITH DIFFERENTIAL/PLATELET
Abs Immature Granulocytes: 0.03 10*3/uL (ref 0.00–0.07)
Basophils Absolute: 0 10*3/uL (ref 0.0–0.1)
Basophils Relative: 0 %
Eosinophils Absolute: 0.1 10*3/uL (ref 0.0–0.5)
Eosinophils Relative: 1 %
HCT: 23.6 % — ABNORMAL LOW (ref 39.0–52.0)
Hemoglobin: 7.7 g/dL — ABNORMAL LOW (ref 13.0–17.0)
Immature Granulocytes: 0 %
Lymphocytes Relative: 41 %
Lymphs Abs: 4.1 10*3/uL — ABNORMAL HIGH (ref 0.7–4.0)
MCH: 34.5 pg — ABNORMAL HIGH (ref 26.0–34.0)
MCHC: 32.6 g/dL (ref 30.0–36.0)
MCV: 105.8 fL — ABNORMAL HIGH (ref 80.0–100.0)
Monocytes Absolute: 0.3 10*3/uL (ref 0.1–1.0)
Monocytes Relative: 3 %
Neutro Abs: 5.5 10*3/uL (ref 1.7–7.7)
Neutrophils Relative %: 55 %
Platelets: 296 10*3/uL (ref 150–400)
RBC: 2.23 MIL/uL — ABNORMAL LOW (ref 4.22–5.81)
RDW: 25.7 % — ABNORMAL HIGH (ref 11.5–15.5)
WBC: 9.9 10*3/uL (ref 4.0–10.5)
nRBC: 0 % (ref 0.0–0.2)

## 2022-12-13 LAB — COMPREHENSIVE METABOLIC PANEL
ALT: 45 U/L — ABNORMAL HIGH (ref 0–44)
AST: 43 U/L — ABNORMAL HIGH (ref 15–41)
Albumin: 3.3 g/dL — ABNORMAL LOW (ref 3.5–5.0)
Alkaline Phosphatase: 178 U/L — ABNORMAL HIGH (ref 38–126)
Anion gap: 9 (ref 5–15)
BUN: 40 mg/dL — ABNORMAL HIGH (ref 6–20)
CO2: 23 mmol/L (ref 22–32)
Calcium: 8.8 mg/dL — ABNORMAL LOW (ref 8.9–10.3)
Chloride: 103 mmol/L (ref 98–111)
Creatinine, Ser: 2.49 mg/dL — ABNORMAL HIGH (ref 0.61–1.24)
GFR, Estimated: 33 mL/min — ABNORMAL LOW (ref >60)
Glucose, Bld: 126 mg/dL — ABNORMAL HIGH (ref 70–99)
Potassium: 4.5 mmol/L (ref 3.5–5.1)
Sodium: 135 mmol/L (ref 135–145)
Total Bilirubin: 1.3 mg/dL — ABNORMAL HIGH (ref 0.3–1.2)
Total Protein: 7.2 g/dL (ref 6.5–8.1)

## 2022-12-13 LAB — RETICULOCYTES
Immature Retic Fract: 17.9 % — ABNORMAL HIGH (ref 2.3–15.9)
RBC.: 2.19 MIL/uL — ABNORMAL LOW (ref 4.22–5.81)
Retic Count, Absolute: 57.6 10*3/uL (ref 19.0–186.0)
Retic Ct Pct: 2.6 % (ref 0.4–3.1)

## 2022-12-13 MED ORDER — OXYCODONE HCL 10 MG PO TABS: 10.0000 mg | ORAL_TABLET | ORAL | 0 refills | Status: DC

## 2022-12-13 NOTE — Telephone Encounter (Signed)
2nd request. May have been done [facility]

## 2022-12-13 NOTE — Progress Notes (Signed)
Reviewed PDMP substance reporting system prior to prescribing opiate medications. No inconsistencies noted.  Meds ordered this encounter  Medications   Oxycodone HCl 10 MG TABS    Sig: Take 1 tablet (10 mg total) by mouth See admin instructions. Take 10 mg by mouth every 6 hours    Dispense:  60 tablet    Refill:  0    Order Specific Question:   Supervising Provider    Answer:   Quentin Angst [[id number]]   [name]  APRN, MSN, FNP-C Patient Care [facility] [address] [place], [place] [address] [phone]

## 2022-12-13 NOTE — Progress Notes (Signed)
PATIENT CARE CENTER NOTE:  Diagnosis: Sickle Cell Disease  Provider: Armenia Hollis FNP  Procedure: lab draw   Pt seen in infusion center for bloodwork. Labs ( CBC w diff, CMP, Reticulocytes) collected peripherally without incident by Phlebotomist Dei. Pt informed he will be notified by phone of results if needed, verbalized understanding. Pt alert, oriented and ambulatory at discharge.

## 2022-12-19 ENCOUNTER — Other Ambulatory Visit: Payer: Self-pay | Admitting: Family Medicine

## 2022-12-19 DIAGNOSIS — D571 Sickle-cell disease without crisis: Secondary | ICD-10-CM

## 2022-12-19 NOTE — Telephone Encounter (Signed)
Refill requested too early.   [name]  APRN, MSN, FNP-C Patient Care [facility] [address] [place], [place] [address] [phone]

## 2022-12-21 ENCOUNTER — Telehealth: Payer: Self-pay | Admitting: Family Medicine

## 2022-12-21 DIAGNOSIS — D571 Sickle-cell disease without crisis: Secondary | ICD-10-CM

## 2022-12-21 MED ORDER — OXYCODONE HCL ER 15 MG PO T12A: 15.0000 mg | EXTENDED_RELEASE_TABLET | Freq: Two times a day (BID) | ORAL | 0 refills | Status: DC

## 2022-12-21 NOTE — Telephone Encounter (Signed)
Reviewed PDMP substance reporting system prior to prescribing opiate medications. No inconsistencies noted.  Meds ordered this encounter  Medications   oxyCODONE (OXYCONTIN) 15 mg 12 hr tablet    Sig: Take 1 tablet (15 mg) by mouth every 12 hours.    Dispense:  60 tablet    Refill:  0    Order Specific Question:   Supervising Provider    Answer:   Quentin Angst [[id number]]   [name]  APRN, MSN, FNP-C Patient Care [name] [address] [place], [place] [address] [phone]

## 2022-12-21 NOTE — Telephone Encounter (Signed)
Caller & Relationship to patient:  MRN #  [id number]   Call Back Number:   Date of Last Office Visit: 12/19/2022     Date of Next Office Visit: 01/23/2023    Medication(s) to be Refilled: Oxycontin 15  Preferred Pharmacy:   ** Please notify patient to allow 48-72 hours to process** **Let patient know to contact pharmacy at the end of the day to make sure medication is ready. ** **If patient has not been seen in a year or longer, book an appointment **Advise to use MyChart for refill requests OR to contact their pharmacy

## 2022-12-22 IMAGING — DX DG ANKLE COMPLETE 3+V*L*
3 series · 3 of 3 positions shown · non-contrast
Comparison: December 11, 2015.

CLINICAL DATA: foot/ankle swelling, sickle cell

EXAM:
LEFT ANKLE COMPLETE - 3+ VIEW; LEFT FOOT - COMPLETE 3+ VIEW

[ankle ap]
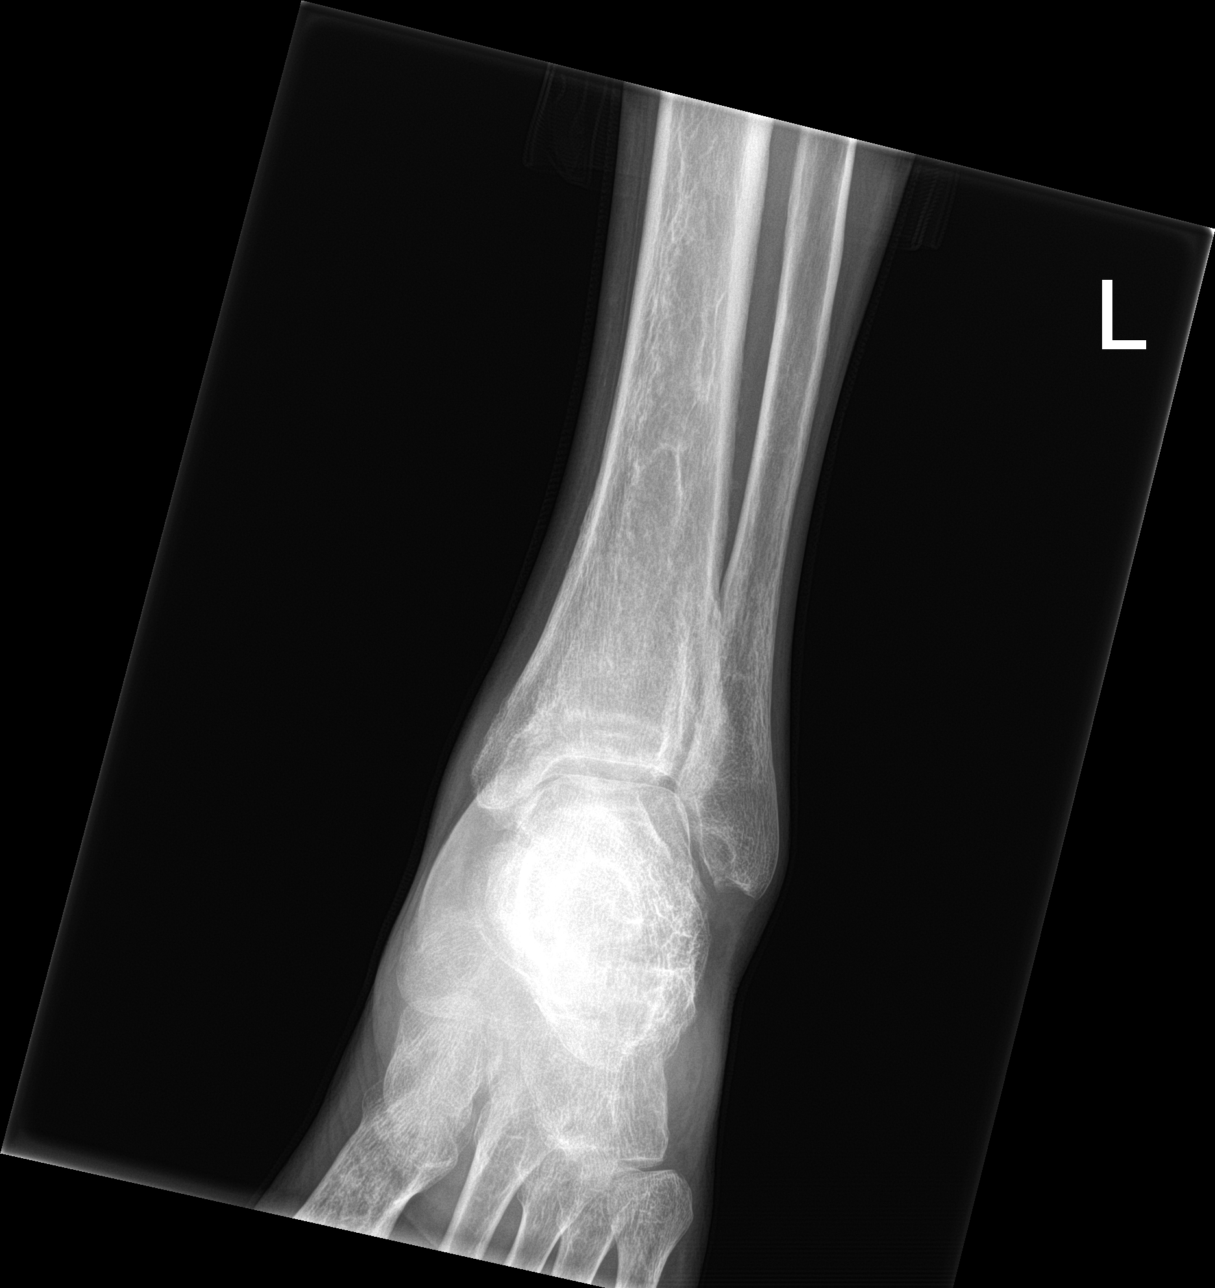

[ankle obl]
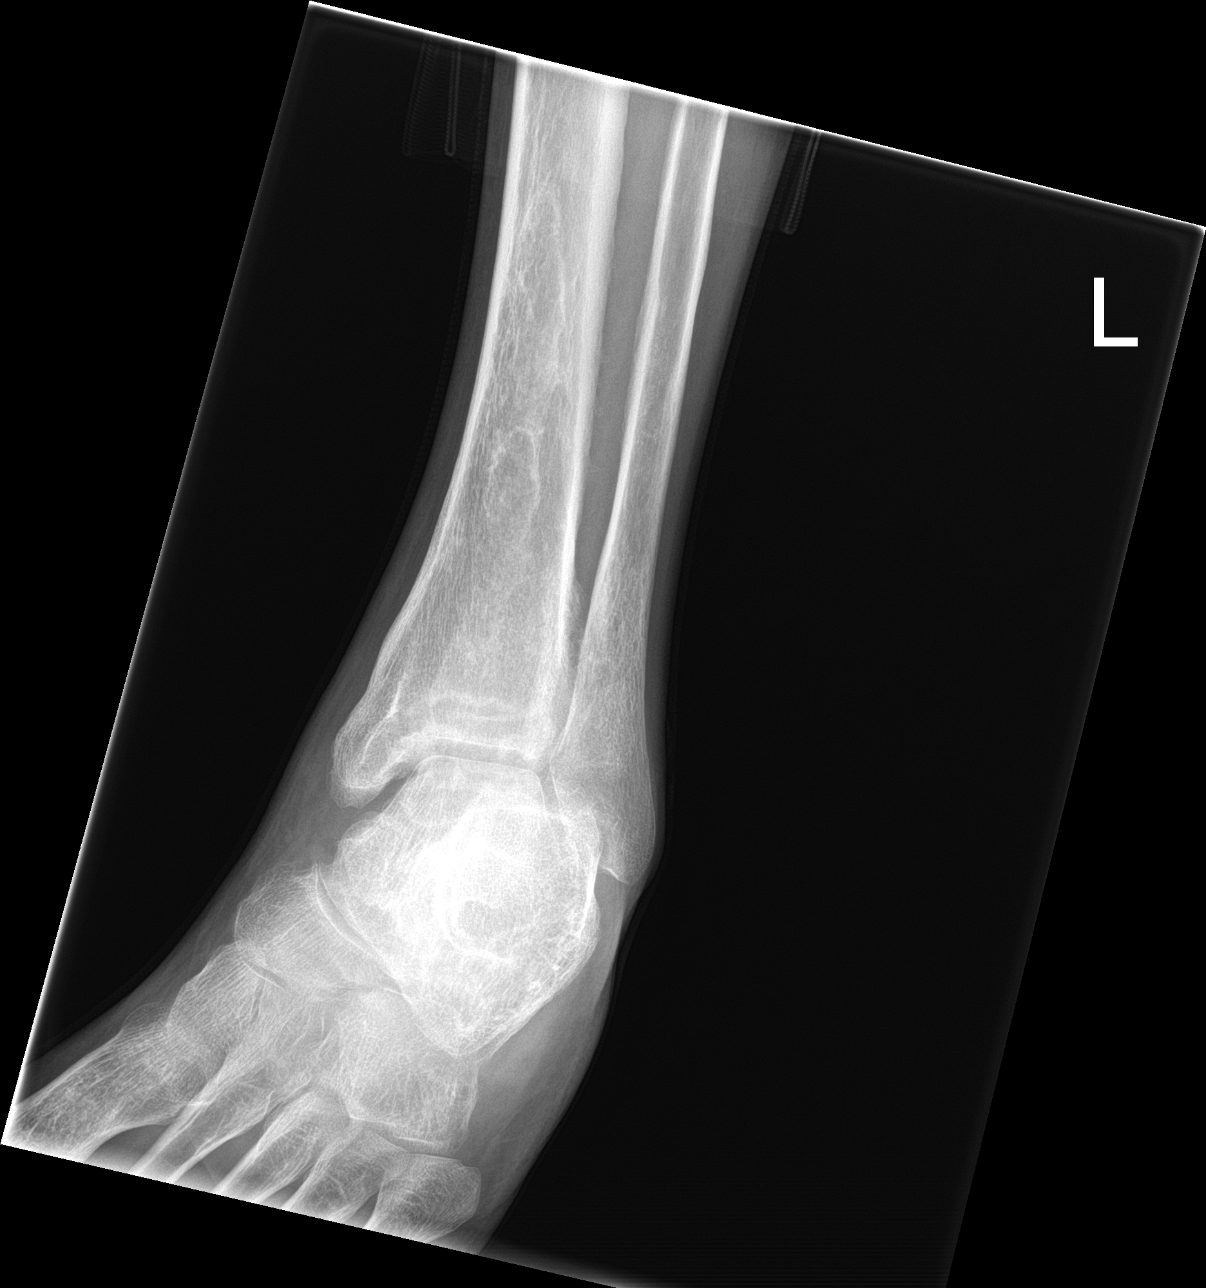

[ankle lat]
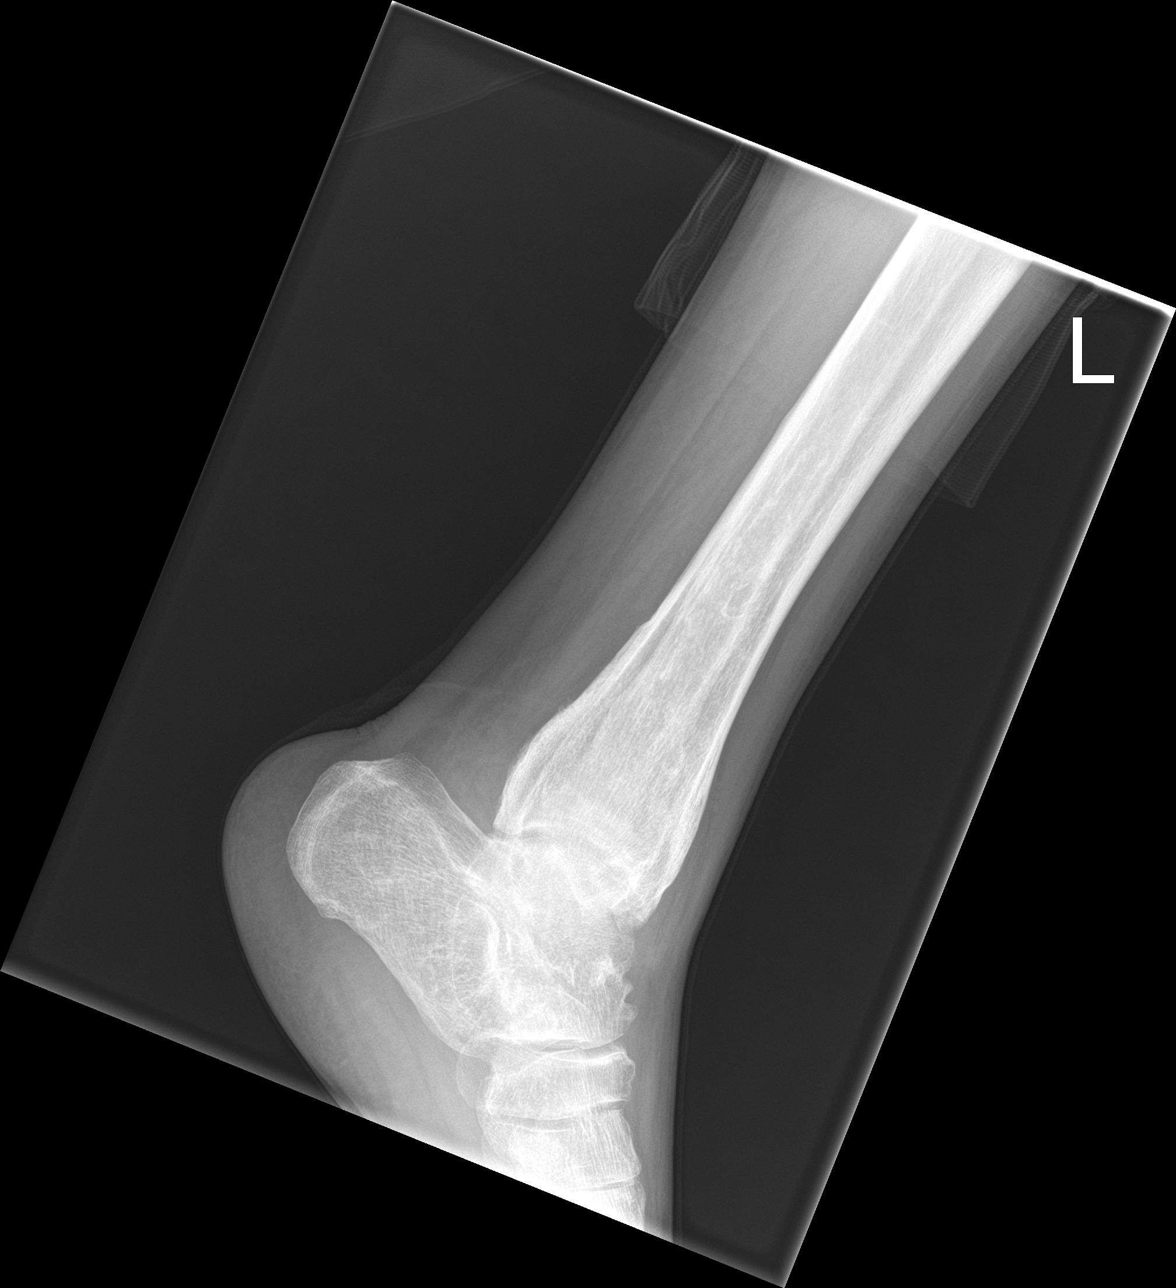

[3 of 3 positions shown; findings below may reference images not displayed]

FINDINGS: Osteopenia. There is serpiginous sclerosis throughout the bones of
the foot, most pronounced in the distal tibia and head of the first
metatarsal. Severe joint space narrowing of the first MTP. Likely
pes planus. No acute fracture or dislocation. Smooth cortical
thickening of the posterior aspect of the tibia, likely due to
chronic reactive stress changes, mildly increased in comparison to
more remote prior.
IMPRESSION: 1. No acute fracture or dislocation.
2. Multifocal serpiginous sclerosis throughout the ankle and foot
consistent with sequela of multiple bone infarctions/sickle cell
disease.
3. Progressive joint space narrowing of the first MTP, likely due to
avascular necrosis and metatarsal head collapse.
4. Mild increase in smooth cortical thickening along the posterior
aspect of the tibia since 2933. This likely is due to chronic
repetitive stress changes. If further imaging is desired, recommend
dedicated MRI.

## 2022-12-22 IMAGING — DX DG FOOT COMPLETE 3+V*L*
3 series · 3 of 3 positions shown · non-contrast
Comparison: December 11, 2015.

CLINICAL DATA: foot/ankle swelling, sickle cell

EXAM:
LEFT ANKLE COMPLETE - 3+ VIEW; LEFT FOOT - COMPLETE 3+ VIEW

[foot ap]
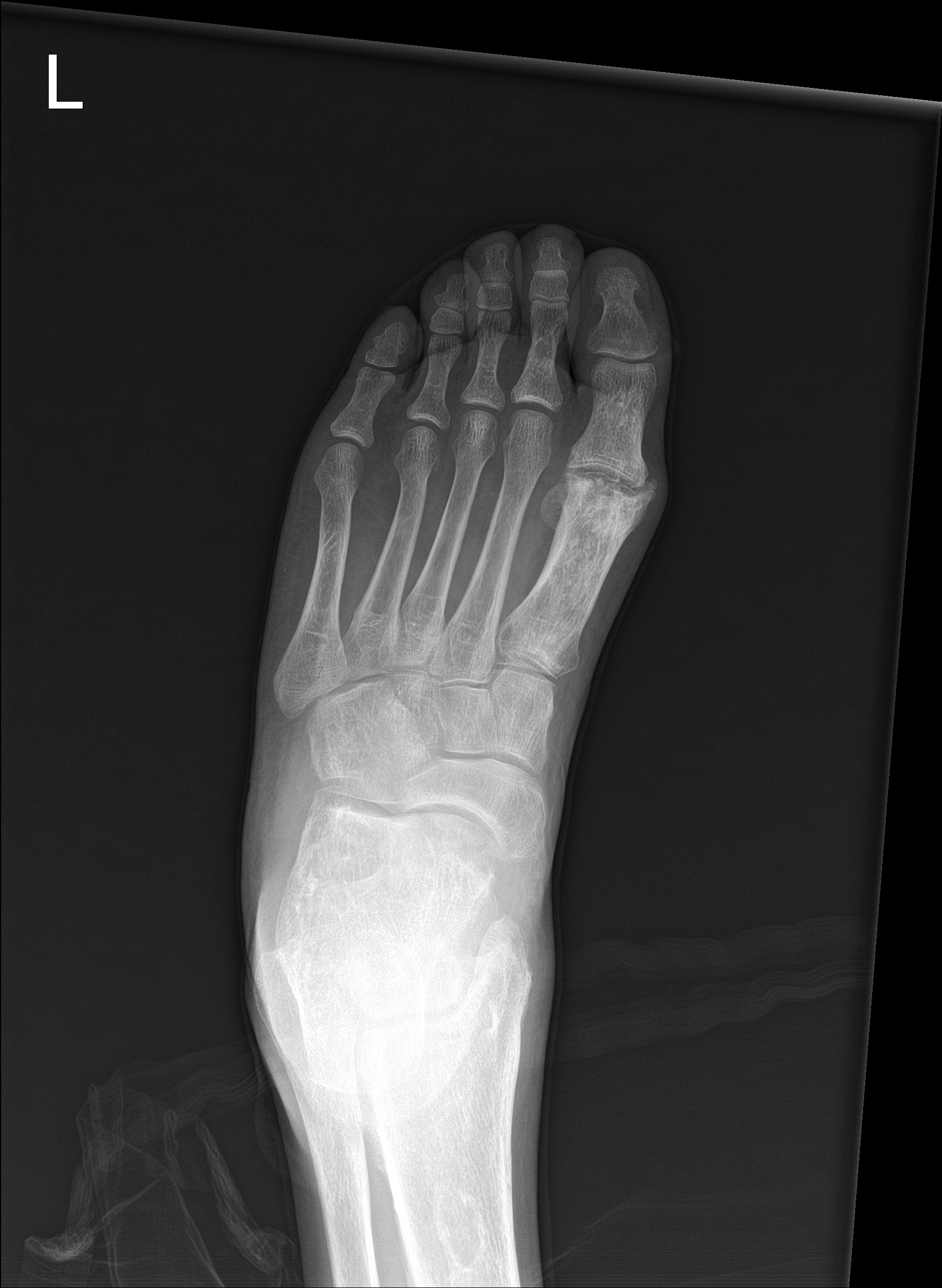

[foot obl]
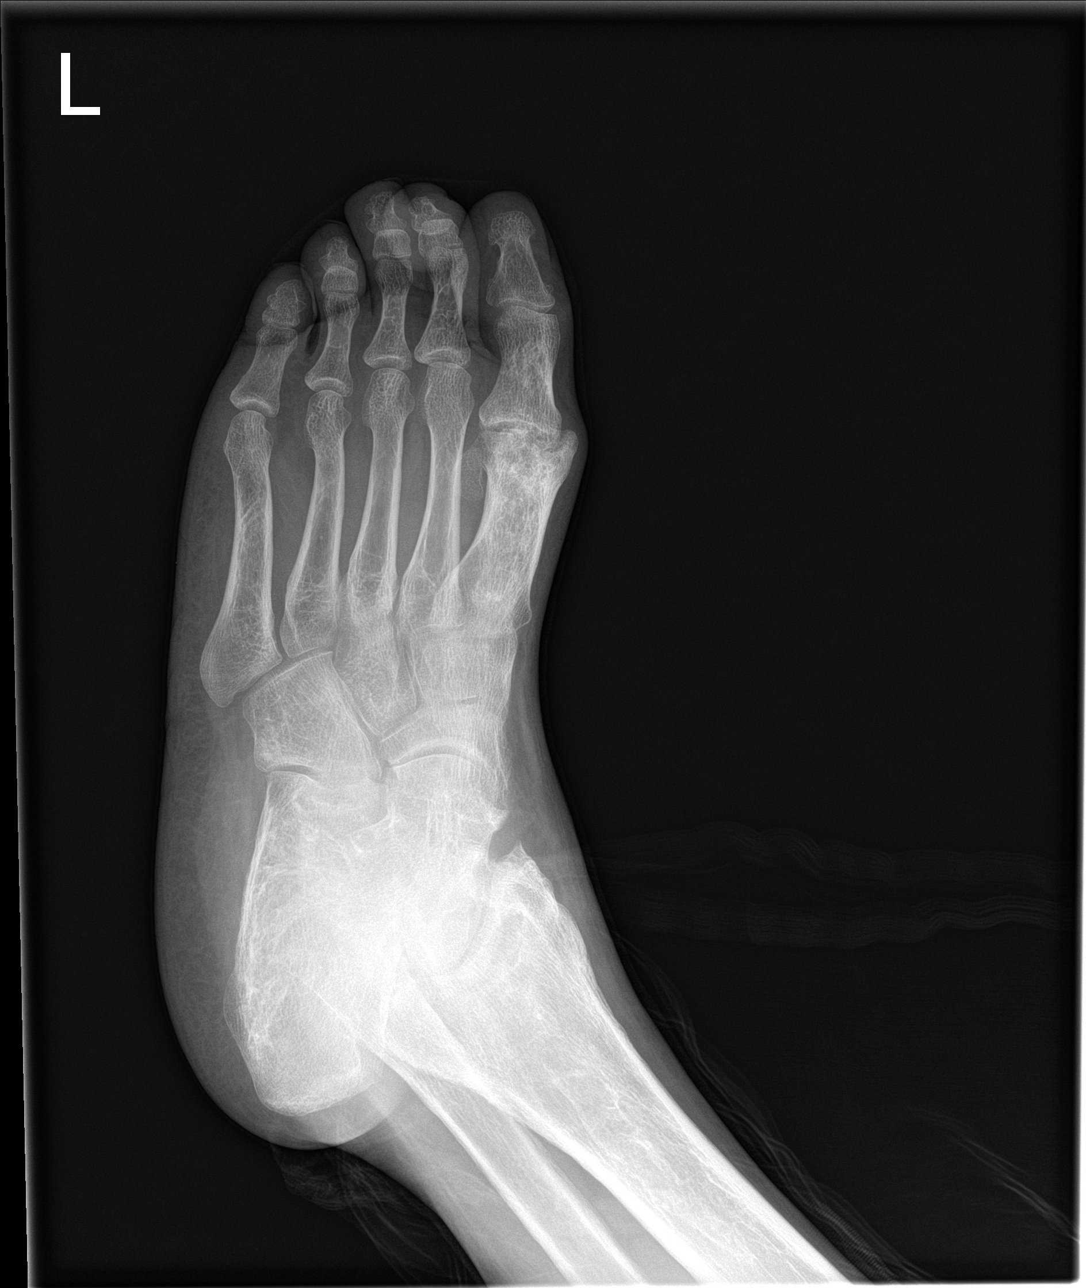

[foot lat]
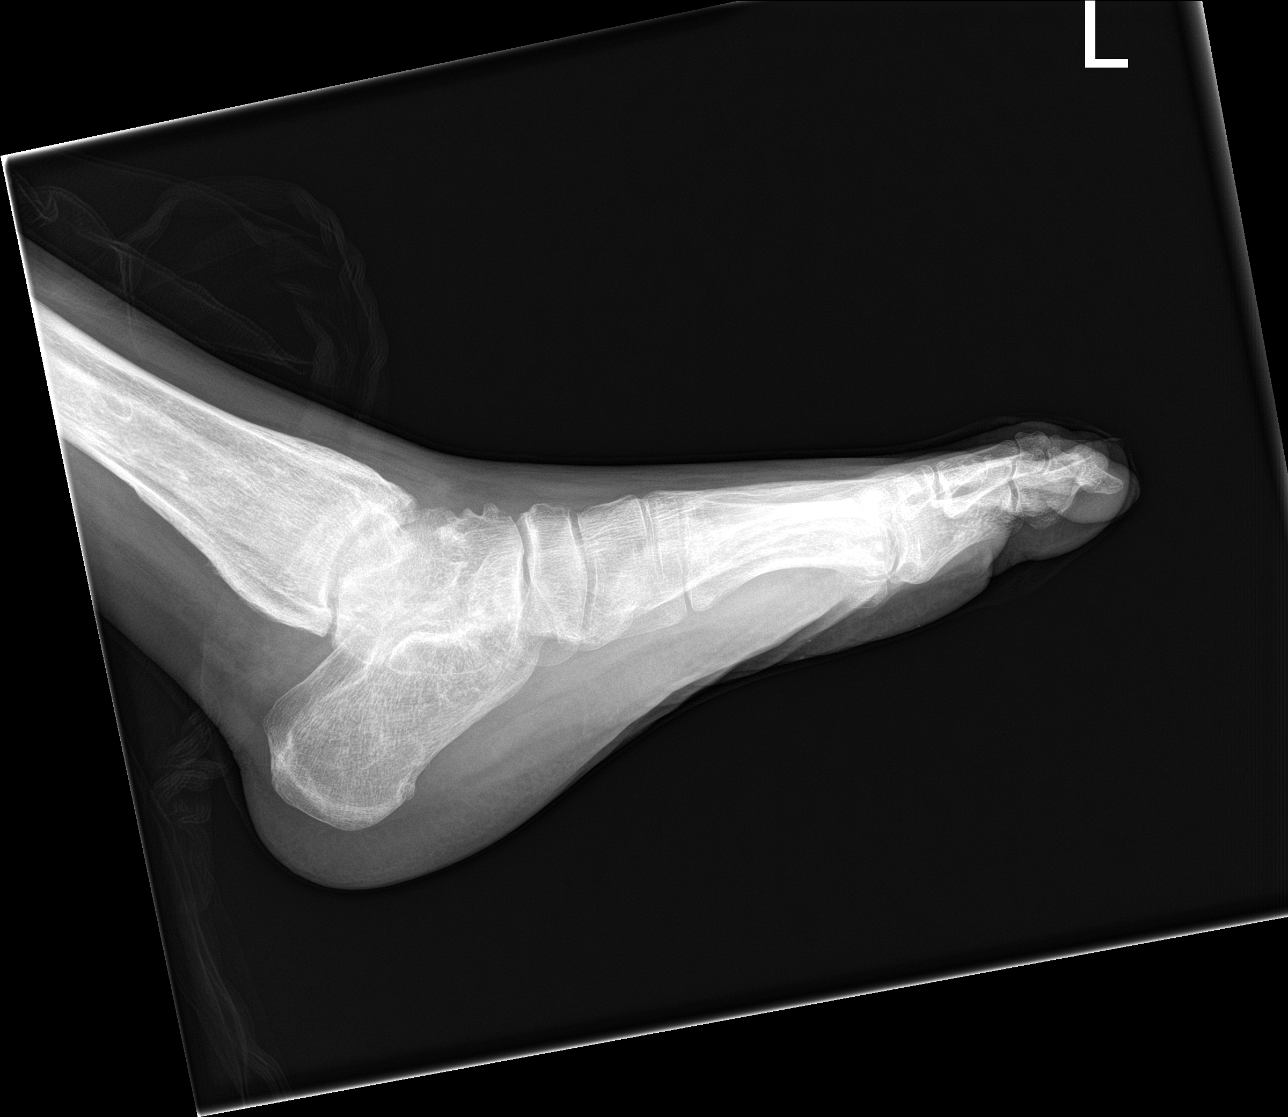

[3 of 3 positions shown; findings below may reference images not displayed]

FINDINGS: Osteopenia. There is serpiginous sclerosis throughout the bones of
the foot, most pronounced in the distal tibia and head of the first
metatarsal. Severe joint space narrowing of the first MTP. Likely
pes planus. No acute fracture or dislocation. Smooth cortical
thickening of the posterior aspect of the tibia, likely due to
chronic reactive stress changes, mildly increased in comparison to
more remote prior.
IMPRESSION: 1. No acute fracture or dislocation.
2. Multifocal serpiginous sclerosis throughout the ankle and foot
consistent with sequela of multiple bone infarctions/sickle cell
disease.
3. Progressive joint space narrowing of the first MTP, likely due to
avascular necrosis and metatarsal head collapse.
4. Mild increase in smooth cortical thickening along the posterior
aspect of the tibia since 2933. This likely is due to chronic
repetitive stress changes. If further imaging is desired, recommend
dedicated MRI.

## 2022-12-27 ENCOUNTER — Non-Acute Institutional Stay (HOSPITAL_COMMUNITY)
Admission: RE | Admit: 2022-12-27 | Discharge: 2022-12-27 | Disposition: A | Discharge status: Home / Self Care | Payer: Medicare Other | Source: Ambulatory Visit | Attending: Internal Medicine | Admitting: Internal Medicine

## 2022-12-27 ENCOUNTER — Telehealth (HOSPITAL_COMMUNITY): Payer: Self-pay

## 2022-12-27 ENCOUNTER — Encounter (HOSPITAL_COMMUNITY): Payer: Medicare Other

## 2022-12-27 DIAGNOSIS — D5700 Hb-SS disease with crisis, unspecified: Secondary | ICD-10-CM | POA: Insufficient documentation

## 2022-12-27 DIAGNOSIS — F1120 Opioid dependence, uncomplicated: Secondary | ICD-10-CM | POA: Insufficient documentation

## 2022-12-27 DIAGNOSIS — K219 Gastro-esophageal reflux disease without esophagitis: Secondary | ICD-10-CM | POA: Insufficient documentation

## 2022-12-27 DIAGNOSIS — D638 Anemia in other chronic diseases classified elsewhere: Secondary | ICD-10-CM | POA: Insufficient documentation

## 2022-12-27 DIAGNOSIS — G894 Chronic pain syndrome: Secondary | ICD-10-CM | POA: Insufficient documentation

## 2022-12-27 DIAGNOSIS — N184 Chronic kidney disease, stage 4 (severe): Secondary | ICD-10-CM | POA: Insufficient documentation

## 2022-12-27 DIAGNOSIS — G43909 Migraine, unspecified, not intractable, without status migrainosus: Secondary | ICD-10-CM | POA: Insufficient documentation

## 2022-12-27 DIAGNOSIS — F17210 Nicotine dependence, cigarettes, uncomplicated: Secondary | ICD-10-CM | POA: Insufficient documentation

## 2022-12-27 DIAGNOSIS — N179 Acute kidney failure, unspecified: Secondary | ICD-10-CM | POA: Insufficient documentation

## 2022-12-27 LAB — COMPREHENSIVE METABOLIC PANEL
ALT: 57 U/L — ABNORMAL HIGH (ref 0–44)
AST: 45 U/L — ABNORMAL HIGH (ref 15–41)
Albumin: 3.6 g/dL (ref 3.5–5.0)
Alkaline Phosphatase: 195 U/L — ABNORMAL HIGH (ref 38–126)
Anion gap: 10 (ref 5–15)
BUN: 59 mg/dL — ABNORMAL HIGH (ref 6–20)
CO2: 20 mmol/L — ABNORMAL LOW (ref 22–32)
Calcium: 9.6 mg/dL (ref 8.9–10.3)
Chloride: 105 mmol/L (ref 98–111)
Creatinine, Ser: 3.38 mg/dL — ABNORMAL HIGH (ref 0.61–1.24)
GFR, Estimated: 23 mL/min — ABNORMAL LOW (ref >60)
Glucose, Bld: 104 mg/dL — ABNORMAL HIGH (ref 70–99)
Potassium: 5.3 mmol/L — ABNORMAL HIGH (ref 3.5–5.1)
Sodium: 135 mmol/L (ref 135–145)
Total Bilirubin: 1.7 mg/dL — ABNORMAL HIGH (ref <1.2)
Total Protein: 7.6 g/dL (ref 6.5–8.1)

## 2022-12-27 LAB — CBC WITH DIFFERENTIAL/PLATELET
Abs Immature Granulocytes: 0.03 10*3/uL (ref 0.00–0.07)
Basophils Absolute: 0 10*3/uL (ref 0.0–0.1)
Basophils Relative: 0 %
Eosinophils Absolute: 0.1 10*3/uL (ref 0.0–0.5)
Eosinophils Relative: 1 %
HCT: 21.9 % — ABNORMAL LOW (ref 39.0–52.0)
Hemoglobin: 7.5 g/dL — ABNORMAL LOW (ref 13.0–17.0)
Immature Granulocytes: 0 %
Lymphocytes Relative: 47 %
Lymphs Abs: 5 10*3/uL — ABNORMAL HIGH (ref 0.7–4.0)
MCH: 36.4 pg — ABNORMAL HIGH (ref 26.0–34.0)
MCHC: 34.2 g/dL (ref 30.0–36.0)
MCV: 106.3 fL — ABNORMAL HIGH (ref 80.0–100.0)
Monocytes Absolute: 0.6 10*3/uL (ref 0.1–1.0)
Monocytes Relative: 5 %
Neutro Abs: 5 10*3/uL (ref 1.7–7.7)
Neutrophils Relative %: 47 %
Platelets: 169 10*3/uL (ref 150–400)
RBC: 2.06 MIL/uL — ABNORMAL LOW (ref 4.22–5.81)
WBC: 10.6 10*3/uL — ABNORMAL HIGH (ref 4.0–10.5)
nRBC: 0 % (ref 0.0–0.2)

## 2022-12-27 LAB — LACTATE DEHYDROGENASE: LDH: 125 U/L (ref 98–192)

## 2022-12-27 NOTE — Progress Notes (Signed)
PATIENT CARE CENTER NOTE:  Diagnosis: Sickle Cell Anemia   Provider: Armenia Hollis FNP  Procedure: lab draw  Pt arrived to [facility] for 2 week lab draw per pt. Labs ( CBC w diff, CMP, LDH)  collected peripherally without incident per orders. Pt states he would like to be called with results. Alert , oriented and ambulatory at discharge.

## 2022-12-27 NOTE — Telephone Encounter (Signed)
Spoke with pt to review labs results from today's bloodwork collection, pt verbalized understanding. Informed him that due to his creatinine level, pt can be scheduled for IV fluids tomorrow morning in the infusion center per provider. Pt states he also feels that he will needs pain treatment for his sickle cell crisis. Instructed pt to call the day hospital tomorrow morning at 8 am for triage , pt verbalized understanding, provider Armenia Hollis FNP made aware. Pt states he has a nephology visit already scheduled on 11/19.

## 2022-12-28 ENCOUNTER — Telehealth (HOSPITAL_COMMUNITY): Payer: Self-pay | Admitting: *Deleted

## 2022-12-28 ENCOUNTER — Non-Acute Institutional Stay (HOSPITAL_COMMUNITY)
Admission: AD | Admit: 2022-12-28 | Discharge: 2022-12-28 | Disposition: A | Discharge status: Home / Self Care | Payer: Medicare Other | Source: Ambulatory Visit | Attending: Internal Medicine | Admitting: Internal Medicine

## 2022-12-28 DIAGNOSIS — D5700 Hb-SS disease with crisis, unspecified: Secondary | ICD-10-CM | POA: Diagnosis not present

## 2022-12-28 DIAGNOSIS — F1120 Opioid dependence, uncomplicated: Secondary | ICD-10-CM | POA: Diagnosis not present

## 2022-12-28 DIAGNOSIS — D638 Anemia in other chronic diseases classified elsewhere: Secondary | ICD-10-CM | POA: Diagnosis not present

## 2022-12-28 DIAGNOSIS — K219 Gastro-esophageal reflux disease without esophagitis: Secondary | ICD-10-CM | POA: Diagnosis not present

## 2022-12-28 DIAGNOSIS — N184 Chronic kidney disease, stage 4 (severe): Secondary | ICD-10-CM | POA: Diagnosis not present

## 2022-12-28 DIAGNOSIS — F17210 Nicotine dependence, cigarettes, uncomplicated: Secondary | ICD-10-CM | POA: Diagnosis not present

## 2022-12-28 LAB — COMPREHENSIVE METABOLIC PANEL
ALT: 52 U/L — ABNORMAL HIGH (ref 0–44)
AST: 45 U/L — ABNORMAL HIGH (ref 15–41)
Albumin: 3.6 g/dL (ref 3.5–5.0)
Alkaline Phosphatase: 195 U/L — ABNORMAL HIGH (ref 38–126)
Anion gap: 10 (ref 5–15)
BUN: 58 mg/dL — ABNORMAL HIGH (ref 6–20)
CO2: 21 mmol/L — ABNORMAL LOW (ref 22–32)
Calcium: 8.9 mg/dL (ref 8.9–10.3)
Chloride: 105 mmol/L (ref 98–111)
Creatinine, Ser: 3.36 mg/dL — ABNORMAL HIGH (ref 0.61–1.24)
GFR, Estimated: 23 mL/min — ABNORMAL LOW (ref >60)
Glucose, Bld: 99 mg/dL (ref 70–99)
Potassium: 4.7 mmol/L (ref 3.5–5.1)
Sodium: 136 mmol/L (ref 135–145)
Total Bilirubin: 1.4 mg/dL — ABNORMAL HIGH (ref <1.2)
Total Protein: 7.4 g/dL (ref 6.5–8.1)

## 2022-12-28 LAB — CBC WITH DIFFERENTIAL/PLATELET
Abs Immature Granulocytes: 0.03 10*3/uL (ref 0.00–0.07)
Basophils Absolute: 0 10*3/uL (ref 0.0–0.1)
Basophils Relative: 0 %
Eosinophils Absolute: 0.1 10*3/uL (ref 0.0–0.5)
Eosinophils Relative: 1 %
HCT: 22.6 % — ABNORMAL LOW (ref 39.0–52.0)
Hemoglobin: 7.6 g/dL — ABNORMAL LOW (ref 13.0–17.0)
Immature Granulocytes: 0 %
Lymphocytes Relative: 45 %
Lymphs Abs: 4.6 10*3/uL — ABNORMAL HIGH (ref 0.7–4.0)
MCH: 36 pg — ABNORMAL HIGH (ref 26.0–34.0)
MCHC: 33.6 g/dL (ref 30.0–36.0)
MCV: 107.1 fL — ABNORMAL HIGH (ref 80.0–100.0)
Monocytes Absolute: 0.5 10*3/uL (ref 0.1–1.0)
Monocytes Relative: 5 %
Neutro Abs: 5 10*3/uL (ref 1.7–7.7)
Neutrophils Relative %: 49 %
Platelets: 191 10*3/uL (ref 150–400)
RBC: 2.11 MIL/uL — ABNORMAL LOW (ref 4.22–5.81)
WBC: 10.3 10*3/uL (ref 4.0–10.5)
nRBC: 0.2 % (ref 0.0–0.2)

## 2022-12-28 LAB — TYPE AND SCREEN
ABO/RH(D): O POS
Antibody Screen: NEGATIVE

## 2022-12-28 LAB — CREATININE, SERUM
Creatinine, Ser: 3.38 mg/dL — ABNORMAL HIGH (ref 0.61–1.24)
GFR, Estimated: 23 mL/min — ABNORMAL LOW (ref >60)

## 2022-12-28 MED ORDER — ACETAMINOPHEN 500 MG PO TABS
1000.0000 mg | ORAL_TABLET | Freq: Once | ORAL | Status: AC
  Administered 2022-12-28: 1000 mg via ORAL
  Filled 2022-12-28: qty 2

## 2022-12-28 MED ORDER — SENNOSIDES-DOCUSATE SODIUM 8.6-50 MG PO TABS: 1.0000 | ORAL_TABLET | Freq: Two times a day (BID) | ORAL | Status: DC

## 2022-12-28 MED ORDER — HYDROMORPHONE 1 MG/ML IV SOLN
INTRAVENOUS | Status: DC
  Administered 2022-12-28: 30 mg via INTRAVENOUS
  Administered 2022-12-28: 6.5 mg via INTRAVENOUS
  Filled 2022-12-28: qty 30

## 2022-12-28 MED ORDER — SODIUM CHLORIDE 0.45 % IV SOLN: INTRAVENOUS | Status: DC

## 2022-12-28 MED ORDER — POLYETHYLENE GLYCOL 3350 17 G PO PACK: 17.0000 g | PACK | Freq: Every day | ORAL | Status: DC | PRN

## 2022-12-28 MED ORDER — NALOXONE HCL 0.4 MG/ML IJ SOLN: 0.4000 mg | INTRAMUSCULAR | Status: DC | PRN

## 2022-12-28 MED ORDER — ONDANSETRON HCL 4 MG/2ML IJ SOLN
4.0000 mg | Freq: Four times a day (QID) | INTRAMUSCULAR | Status: DC | PRN
  Administered 2022-12-28: 4 mg via INTRAVENOUS
  Filled 2022-12-28: qty 2

## 2022-12-28 MED ORDER — DIPHENHYDRAMINE HCL 25 MG PO CAPS
25.0000 mg | ORAL_CAPSULE | ORAL | Status: DC | PRN
  Administered 2022-12-28: 25 mg via ORAL
  Filled 2022-12-28: qty 1

## 2022-12-28 MED ORDER — SODIUM CHLORIDE 0.9% FLUSH: 9.0000 mL | INTRAVENOUS | Status: DC | PRN

## 2022-12-28 NOTE — H&P (Signed)
[facility] History and Physical   Date: 12/28/2022  Patient name: [name] [name]. Medical record number: [id number] Date of birth: Jan 03, 1981 Age: 42 y.o. Gender: male PCP: Massie Maroon, FNP  Attending physician: Quentin Angst, MD  Chief Complaint: Sickle cell pain   History of Present Illness: [name] [name] is a 42 year old male with a medical history significant for sickle cell disease, chronic kidney disease stage IV, anemia of chronic disease, and opiate dependence and tolerance presents with complaints of generalized pain, fatigue, and "lightheadedness" over the past several days.  Patient states that he has generally "not felt well" over the past several days.  He attributes this to changes in weather.  Patient has been followed closely by his hematology and nephrology team at [facility].  Patient recently underwent surgery for right total knee replacement several weeks prior.  Patient states that he continues to have swelling but is following up with his surgeon regularly.  Patient rates his lower extremity pain as 8/10.  He last had oxycodone this a.m. with some relief.  He denies any fever, chills, chest pain, or shortness of breath.  No urinary symptoms, nausea, vomiting, or diarrhea.  Meds: Medications Prior to Admission  Medication Sig Dispense Refill Last Dose   amitriptyline (ELAVIL) 25 MG tablet Take 2 tablets (50 mg total) by mouth at bedtime. 60 tablet 5 12/27/2022   calcitRIOL (ROCALTROL) 0.25 MCG capsule Take 0.25 mcg by mouth daily with breakfast.   12/28/2022   Deferiprone, Twice Daily, (FERRIPROX TWICE-A-DAY) 1000 MG TABS Take 3,000 mg by mouth daily. 180 tablet 0 12/28/2022   folic acid (FOLVITE) 1 MG tablet TAKE 1 TABLET BY MOUTH EVERY DAY (Patient taking differently: Take 1 mg by mouth every morning.) 90 tablet 4 12/28/2022   hydroxyurea (HYDREA) 500 MG capsule Take 1 capsule by mouth in the morning on Sun/Tues/Wed/Thurs/Sat and 2  capsules on Mon/Fri 260 capsule 1 12/28/2022   oxyCODONE (OXYCONTIN) 15 mg 12 hr tablet Take 1 tablet (15 mg) by mouth every 12 hours. 60 tablet 0 12/28/2022   Oxycodone HCl 10 MG TABS Take 1 tablet (10 mg total) by mouth See admin instructions. Take 10 mg by mouth every 6 hours 60 tablet 0 12/28/2022   sodium bicarbonate 650 MG tablet Take 1,950 mg by mouth with breakfast, with lunch, and with evening meal.   12/28/2022   TYLENOL 8 HOUR ARTHRITIS PAIN 650 MG CR tablet Take 650 mg by mouth every 8 (eight) hours as needed for pain.   Past Week   VELTASSA 16.8 g PACK Take 16.8 g by mouth daily.   12/27/2022   allopurinol (ZYLOPRIM) 100 MG tablet Take 0.5 tablets (50 mg total) by mouth daily. (Patient not taking: Reported on 09/14/2022) 30 tablet 0 Unknown   voxelotor (OXBRYTA) 500 MG TABS tablet Take 1,500 mg by mouth daily with breakfast.       Allergies: Nsaids and Morphine and codeine Past Medical History:  Diagnosis Date   Abscess of right iliac fossa 09/24/2018   required Perc Drain 09/24/2018   Arachnoid Cyst of brain bilaterally    "2 really small ones in the back of my head; inside; saw them w/MRI" (09/25/2012)   Bacterial pneumonia ~ 2012   "caught it here in the hospital" (09/25/2012)   Chronic kidney disease    "from my sickle cell" (09/25/2012)   CKD (chronic kidney disease), stage II    Colitis 04/19/2017   CT scan abd/ pelvis  GERD (gastroesophageal reflux disease)    "after I eat alot of spicey foods" (09/25/2012)   Gynecomastia, male 07/10/2012   History of blood transfusion    "always related to sickle cell crisis" (09/25/2012)   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   Noted in noted from H[facility] at [facility]   Migraines    "take RX qd to prevent them" (09/25/2012)   Opioid dependence with withdrawal (HCC) 08/30/2012   Renal insufficiency    Sickle cell anemia (HCC)    Sickle cell crisis (HCC) 09/25/2012   Sickle cell nephropathy (HCC) 07/10/2012    Sinus tachycardia    Tachycardia with heart rate 121-140 beats per minute with ambulation 08/04/2016   Past Surgical History:  Procedure Laterality Date   ABCESS DRAINAGE     CHOLECYSTECTOMY  ~ 2012   COLONOSCOPY N/A 04/23/2017   Procedure: COLONOSCOPY;  Surgeon: Hilarie Fredrickson, MD;  Location: [facility];  Service: Endoscopy;  Laterality: N/A;   IR FLUORO GUIDE CV LINE RIGHT  12/17/2016   IR FLUORO GUIDE CV LINE RIGHT  02/07/2021   IR RADIOLOGIST EVAL & MGMT  10/02/2018   IR RADIOLOGIST EVAL & MGMT  10/15/2018   IR REMOVAL TUN CV CATH W/O FL  12/21/2016   IR REMOVAL TUN CV CATH W/O FL  02/11/2021   IR US GUIDE VASC ACCESS RIGHT  12/17/2016   IR US GUIDE VASC ACCESS RIGHT  02/07/2021   spleenectomy     Family History  Problem Relation Age of Onset   Breast cancer Mother    Social History   Socioeconomic History   Marital status: Single    Spouse name: Not on file   Number of children: Not on file   Years of education: Not on file   Highest education level: Not on file  Occupational History   Not on file  Tobacco Use   Smoking status: Some Days    Current packs/day: 0.10    Types: Cigarettes   Smokeless tobacco: Never  Vaping Use   Vaping status: Never Used  Substance and Sexual Activity   Alcohol use: Yes    Comment: Once a month   Drug use: Yes    Types: Marijuana    Comment: Every 2-3 weeks    Sexual activity: Yes    Birth control/protection: Condom  Other Topics Concern   Not on file  Social History Narrative    Lives alone in [place].   Back at school, studying Patent attorney. Unemployed.    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    Social Determinants of Health   Financial Resource Strain: Low Risk  (02/09/2022)   Overall Financial Resource Strain (CARDIA)    Difficulty of Paying Living Expenses: Not hard at  all  Food Insecurity: No Food Insecurity (11/03/2022)   Hunger Vital Sign    Worried About Running Out of Food in the Last Year: Never true    Ran Out of Food in the Last Year: Never true  Transportation Needs: No Transportation Needs (11/03/2022)   PRAPARE - Administrator, Civil Service (Medical): No    Lack of Transportation (Non-Medical): No  Physical Activity: Sufficiently Active (02/09/2022)   Exercise Vital Sign    Days of Exercise per Week: 7 days    Minutes of Exercise per Session: 30 min  Stress:  No Stress Concern Present (02/09/2022)   [employer] of Occupational Health - Occupational Stress Questionnaire    Feeling of Stress : Not at all  Social Connections: Socially Isolated (02/09/2022)   Social Connection and Isolation Panel [NHANES]    Frequency of Communication with Friends and Family: More than three times a week    Frequency of Social Gatherings with Friends and Family: Once a week    Attends Religious Services: Never    Database administrator or Organizations: No    Attends Banker Meetings: Never    Marital Status: Divorced  Catering manager Violence: Not At Risk (09/04/2022)   Humiliation, Afraid, Rape, and Kick questionnaire    Fear of Current or Ex-Partner: No    Emotionally Abused: No    Physically Abused: No    Sexually Abused: No    Review of Systems: Review of Systems  Constitutional:  Positive for malaise/fatigue. Negative for chills and fever.  HENT: Negative.    Respiratory: Negative.    Cardiovascular: Negative.   Gastrointestinal: Negative.   Genitourinary: Negative.   Musculoskeletal:  Positive for back pain and joint pain.  Skin: Negative.   Neurological:  Positive for dizziness. Negative for weakness.  Psychiatric/Behavioral: Negative.       Physical Exam: Blood pressure 116/71, pulse (!) 108, temperature 97.6 F (36.4 C), temperature source Temporal, resp. rate 16, SpO2 100%.Physical Exam Constitutional:       Appearance: Normal appearance.  Eyes:     Pupils: Pupils are equal, round, and reactive to light.  Cardiovascular:     Rate and Rhythm: Normal rate.     Heart sounds: Murmur heard.  Abdominal:     General: Bowel sounds are normal.  Musculoskeletal:        General: Normal range of motion.  Skin:    General: Skin is warm.  Neurological:     General: No focal deficit present.     Mental Status: He is alert.  Psychiatric:        Mood and Affect: Mood normal.        Thought Content: Thought content normal.      Lab results: Results for orders placed or performed during the hospital encounter of 12/27/22 (from the past 24 hour(s))  CBC with Differential/Platelet     Status: Abnormal   Collection Time: 12/27/22  1:29 PM  Result Value Ref Range   WBC 10.6 (H) 4.0 - 10.5 K/uL   RBC 2.06 (L) 4.22 - 5.81 MIL/uL   Hemoglobin 7.5 (L) 13.0 - 17.0 g/dL   HCT 25.9 (L) 56.3 - 87.5 %   MCV 106.3 (H) 80.0 - 100.0 fL   MCH 36.4 (H) 26.0 - 34.0 pg   MCHC 34.2 30.0 - 36.0 g/dL   RDW Not Measured 64.3 - 15.5 %   Platelets 169 150 - 400 K/uL   nRBC 0.0 0.0 - 0.2 %   Neutrophils Relative % 47 %   Neutro Abs 5.0 1.7 - 7.7 K/uL   Lymphocytes Relative 47 %   Lymphs Abs 5.0 (H) 0.7 - 4.0 K/uL   Monocytes Relative 5 %   Monocytes Absolute 0.6 0.1 - 1.0 K/uL   Eosinophils Relative 1 %   Eosinophils Absolute 0.1 0.0 - 0.5 K/uL   Basophils Relative 0 %   Basophils Absolute 0.0 0.0 - 0.1 K/uL   Immature Granulocytes 0 %   Abs Immature Granulocytes 0.03 0.00 - 0.07 K/uL   Schistocytes PRESENT    Pappenheimer  Bodies PRESENT    Carollee Massed Bodies PRESENT    Polychromasia PRESENT    Target Cells PRESENT    Ovalocytes PRESENT    Stomatocytes PRESENT   Comprehensive metabolic panel     Status: Abnormal   Collection Time: 12/27/22  1:29 PM  Result Value Ref Range   Sodium 135 135 - 145 mmol/L   Potassium 5.3 (H) 3.5 - 5.1 mmol/L   Chloride 105 98 - 111 mmol/L   CO2 20 (L) 22 - 32 mmol/L    Glucose, Bld 104 (H) 70 - 99 mg/dL   BUN 59 (H) 6 - 20 mg/dL   Creatinine, Ser 9.60 (H) 0.61 - 1.24 mg/dL   Calcium 9.6 8.9 - 45.4 mg/dL   Total Protein 7.6 6.5 - 8.1 g/dL   Albumin 3.6 3.5 - 5.0 g/dL   AST 45 (H) 15 - 41 U/L   ALT 57 (H) 0 - 44 U/L   Alkaline Phosphatase 195 (H) 38 - 126 U/L   Total Bilirubin 1.7 (H) <1.2 mg/dL   GFR, Estimated 23 (L) >60 mL/min   Anion gap 10 5 - 15  Lactate dehydrogenase     Status: None   Collection Time: 12/27/22  1:29 PM  Result Value Ref Range   LDH 125 98 - 192 U/L   *Note: Due to a large number of results and/or encounters for the requested time period, some results have not been displayed. A complete set of results can be found in Results Review.    Imaging results:  No results found.   Assessment & Plan: Patient admitted to sickle cell day infusion center for management of pain crisis.  Patient is opiate tolerant Initiate IV dilaudid PCA. Settings of  IV fluids, 0.45% saline at 150 mL/h Tylenol 1000 mg by mouth times one dose Review CBC with differential, complete metabolic panel, and reticulocytes as results become available. Pain intensity will be reevaluated in context of functioning and relationship to baseline as care progresses If pain intensity remains elevated and/or sudden change in hemodynamic stability transition to inpatient services for higher level of care.     [name]  APRN, MSN, FNP-C Patient Care [facility] [address] [place], [place] [address] [phone]  12/28/2022, 9:28 AM

## 2022-12-28 NOTE — Progress Notes (Signed)
Pt admitted to the day hospital today for sickle cell pain treatment. On arrival, pt rates pain 7/10 to bilateral legs. Pt received Dilaudid PCA, IV Zofran, and hydrated with IV fluids. Pt also received PO Tylenol and Benadryl. At discharge, pt rates pain 4/10. Per provider, pt can return to clinic in the morning. AVS offered, but pt declined. Pt is alert, oriented, and ambulatory at discharge.

## 2022-12-28 NOTE — Telephone Encounter (Signed)
Patient called requesting to come to the day hospital for sickle cell pain. Patient reports bilateral leg pain rated 7/10. Reports taking Oxycodone 15 mg and Oxycontin 10 mg around 1 hour ago. COVID-19 screening done and patient denies all symptoms and exposures. Denies fever, chest pain, nausea, vomiting, diarrhea, abdominal pain and priapism. Admits to having transportation without driving self. [place], FNP notified. Patient can come to the day hospital for pain management. Patient advised and expresses an understanding.

## 2022-12-29 ENCOUNTER — Telehealth (HOSPITAL_COMMUNITY): Payer: Self-pay

## 2022-12-29 ENCOUNTER — Inpatient Hospital Stay (HOSPITAL_COMMUNITY)
Admission: AD | Admit: 2022-12-29 | Discharge: 2022-12-31 | DRG: 812 | Disposition: A | Discharge status: Home / Self Care | Payer: Medicare Other | Attending: Internal Medicine | Admitting: Internal Medicine

## 2022-12-29 DIAGNOSIS — Z885 Allergy status to narcotic agent status: Secondary | ICD-10-CM

## 2022-12-29 DIAGNOSIS — N184 Chronic kidney disease, stage 4 (severe): Secondary | ICD-10-CM | POA: Diagnosis present

## 2022-12-29 DIAGNOSIS — N179 Acute kidney failure, unspecified: Secondary | ICD-10-CM | POA: Diagnosis present

## 2022-12-29 DIAGNOSIS — F17210 Nicotine dependence, cigarettes, uncomplicated: Secondary | ICD-10-CM | POA: Diagnosis present

## 2022-12-29 DIAGNOSIS — Z9049 Acquired absence of other specified parts of digestive tract: Secondary | ICD-10-CM

## 2022-12-29 DIAGNOSIS — Z9081 Acquired absence of spleen: Secondary | ICD-10-CM

## 2022-12-29 DIAGNOSIS — N189 Chronic kidney disease, unspecified: Secondary | ICD-10-CM | POA: Diagnosis present

## 2022-12-29 DIAGNOSIS — G894 Chronic pain syndrome: Secondary | ICD-10-CM | POA: Diagnosis present

## 2022-12-29 DIAGNOSIS — G43909 Migraine, unspecified, not intractable, without status migrainosus: Secondary | ICD-10-CM | POA: Diagnosis present

## 2022-12-29 DIAGNOSIS — Z888 Allergy status to other drugs, medicaments and biological substances status: Secondary | ICD-10-CM

## 2022-12-29 DIAGNOSIS — Z79899 Other long term (current) drug therapy: Secondary | ICD-10-CM

## 2022-12-29 DIAGNOSIS — Z8669 Personal history of other diseases of the nervous system and sense organs: Secondary | ICD-10-CM

## 2022-12-29 DIAGNOSIS — D5700 Hb-SS disease with crisis, unspecified: Principal | ICD-10-CM | POA: Diagnosis present

## 2022-12-29 DIAGNOSIS — Z803 Family history of malignant neoplasm of breast: Secondary | ICD-10-CM

## 2022-12-29 LAB — COMPREHENSIVE METABOLIC PANEL
ALT: 48 U/L — ABNORMAL HIGH (ref 0–44)
AST: 45 U/L — ABNORMAL HIGH (ref 15–41)
Albumin: 3.5 g/dL (ref 3.5–5.0)
Alkaline Phosphatase: 196 U/L — ABNORMAL HIGH (ref 38–126)
Anion gap: 9 (ref 5–15)
BUN: 54 mg/dL — ABNORMAL HIGH (ref 6–20)
CO2: 21 mmol/L — ABNORMAL LOW (ref 22–32)
Calcium: 8.9 mg/dL (ref 8.9–10.3)
Chloride: 105 mmol/L (ref 98–111)
Creatinine, Ser: 3.45 mg/dL — ABNORMAL HIGH (ref 0.61–1.24)
GFR, Estimated: 22 mL/min — ABNORMAL LOW (ref >60)
Glucose, Bld: 107 mg/dL — ABNORMAL HIGH (ref 70–99)
Potassium: 4.4 mmol/L (ref 3.5–5.1)
Sodium: 135 mmol/L (ref 135–145)
Total Bilirubin: 1.7 mg/dL — ABNORMAL HIGH (ref <1.2)
Total Protein: 6.9 g/dL (ref 6.5–8.1)

## 2022-12-29 LAB — CBC
HCT: 20.7 % — ABNORMAL LOW (ref 39.0–52.0)
Hemoglobin: 7 g/dL — ABNORMAL LOW (ref 13.0–17.0)
MCH: 36.1 pg — ABNORMAL HIGH (ref 26.0–34.0)
MCHC: 33.8 g/dL (ref 30.0–36.0)
MCV: 106.7 fL — ABNORMAL HIGH (ref 80.0–100.0)
Platelets: 143 10*3/uL — ABNORMAL LOW (ref 150–400)
RBC: 1.94 MIL/uL — ABNORMAL LOW (ref 4.22–5.81)
WBC: 10 10*3/uL (ref 4.0–10.5)
nRBC: 0.2 % (ref 0.0–0.2)

## 2022-12-29 MED ORDER — OXYCODONE HCL ER 15 MG PO T12A
15.0000 mg | EXTENDED_RELEASE_TABLET | Freq: Two times a day (BID) | ORAL | Status: DC
  Administered 2022-12-29 – 2022-12-31 (×4): 15 mg via ORAL
  Filled 2022-12-29 (×4): qty 1

## 2022-12-29 MED ORDER — PATIROMER SORBITEX CALCIUM 8.4 G PO PACK
16.8000 g | PACK | Freq: Every day | ORAL | Status: DC
  Administered 2022-12-30: 16.8 g via ORAL
  Filled 2022-12-29 (×2): qty 2

## 2022-12-29 MED ORDER — CALCITRIOL 0.25 MCG PO CAPS
0.2500 ug | ORAL_CAPSULE | Freq: Every day | ORAL | Status: DC
  Administered 2022-12-30 – 2022-12-31 (×2): 0.25 ug via ORAL
  Filled 2022-12-29 (×3): qty 1

## 2022-12-29 MED ORDER — DEFERIPRONE (TWICE DAILY) 1000 MG PO TABS: 3000.0000 mg | ORAL_TABLET | Freq: Every day | ORAL | Status: DC

## 2022-12-29 MED ORDER — HEPARIN SODIUM (PORCINE) 5000 UNIT/ML IJ SOLN
5000.0000 [IU] | Freq: Three times a day (TID) | INTRAMUSCULAR | Status: DC
Start: 2022-12-29 — End: 2022-12-30
  Administered 2022-12-29: 5000 [IU] via SUBCUTANEOUS
  Filled 2022-12-29 (×2): qty 1

## 2022-12-29 MED ORDER — SENNOSIDES-DOCUSATE SODIUM 8.6-50 MG PO TABS
1.0000 | ORAL_TABLET | Freq: Two times a day (BID) | ORAL | Status: DC
  Administered 2022-12-29: 1 via ORAL
  Filled 2022-12-29 (×3): qty 1

## 2022-12-29 MED ORDER — OXYCODONE HCL 5 MG PO TABS
10.0000 mg | ORAL_TABLET | Freq: Four times a day (QID) | ORAL | Status: DC | PRN
  Administered 2022-12-30: 10 mg via ORAL

## 2022-12-29 MED ORDER — ACETAMINOPHEN 500 MG PO TABS
1000.0000 mg | ORAL_TABLET | Freq: Once | ORAL | Status: AC
  Administered 2022-12-29: 1000 mg via ORAL
  Filled 2022-12-29: qty 2

## 2022-12-29 MED ORDER — AMITRIPTYLINE HCL 50 MG PO TABS: 50.0000 mg | ORAL_TABLET | Freq: Every day | ORAL | Status: DC

## 2022-12-29 MED ORDER — HYDROMORPHONE HCL 2 MG/ML IJ SOLN
2.0000 mg | INTRAMUSCULAR | Status: AC | PRN
  Administered 2022-12-29 (×3): 2 mg via INTRAVENOUS
  Filled 2022-12-29 (×3): qty 1

## 2022-12-29 MED ORDER — HYDROXYUREA 500 MG PO CAPS: 1000.0000 mg | ORAL_CAPSULE | ORAL | Status: DC

## 2022-12-29 MED ORDER — OXYCODONE HCL 5 MG PO TABS
10.0000 mg | ORAL_TABLET | ORAL | Status: DC | PRN
  Administered 2022-12-29 – 2022-12-31 (×3): 10 mg via ORAL
  Filled 2022-12-29 (×4): qty 2

## 2022-12-29 MED ORDER — AMITRIPTYLINE HCL 50 MG PO TABS
50.0000 mg | ORAL_TABLET | Freq: Every day | ORAL | Status: DC
  Administered 2022-12-29 – 2022-12-30 (×2): 50 mg via ORAL
  Filled 2022-12-29: qty 1
  Filled 2022-12-29: qty 2
  Filled 2022-12-29: qty 1
  Filled 2022-12-29: qty 2

## 2022-12-29 MED ORDER — SODIUM CHLORIDE 0.45 % IV SOLN: INTRAVENOUS | Status: AC

## 2022-12-29 MED ORDER — SODIUM BICARBONATE 650 MG PO TABS
1950.0000 mg | ORAL_TABLET | Freq: Three times a day (TID) | ORAL | Status: DC
  Administered 2022-12-29 – 2022-12-31 (×5): 1950 mg via ORAL
  Filled 2022-12-29 (×5): qty 3

## 2022-12-29 MED ORDER — HYDROXYUREA 500 MG PO CAPS
500.0000 mg | ORAL_CAPSULE | ORAL | Status: DC
  Administered 2022-12-30 – 2022-12-31 (×2): 500 mg via ORAL
  Filled 2022-12-29 (×2): qty 1

## 2022-12-29 MED ORDER — POLYETHYLENE GLYCOL 3350 17 G PO PACK: 17.0000 g | PACK | Freq: Every day | ORAL | Status: DC | PRN

## 2022-12-29 MED ORDER — FOLIC ACID 1 MG PO TABS
1.0000 mg | ORAL_TABLET | Freq: Every morning | ORAL | Status: DC
  Administered 2022-12-30 – 2022-12-31 (×2): 1 mg via ORAL
  Filled 2022-12-29 (×2): qty 1

## 2022-12-29 NOTE — Plan of Care (Signed)

## 2022-12-29 NOTE — Progress Notes (Signed)
Pt admitted to the day hospital today for sickle cell pain treatment. On arrival, pt rates pain 5/10 to bilateral legs. Pt received IV Dilaudid 2 mg as needed every 2 hours for 2 x doses, and hydrated with IV fluids via PIV. Pt also received PO Tylenol and Oxycodone.Pt reports feeling dizzy, and fatigue. EKG done. Provider assessed pt, and pt is being admitted to inpatient by [place] Hollis, FNP. Report given to Irving Burton, RN. PIV remains clean, dry, intact and is saline locked at transfer. At transfer, pt rates pain 3/10. Pt is alert, oriented, and ambulatory at discharge. Pt transferred to [address], bed 13 in wheelchair by RN.

## 2022-12-29 NOTE — Telephone Encounter (Signed)
Pt called day hospital wanting to come in today for sickle cell pain treatment. Pt was seen in the clinic yesterday. Pt reports 4/10 pain to bilateral legs. Pt reports taking his Oxycodone and Oxycontin at 6 AM. Pt screened for COVID and denies symptoms and exposures, Pt denies fever, chest pain, nausea, diarrhea, abdominal pain, and priapism. Pt states that he had vomiting last night, but that has since resolved. Pt reports feeling week, lightheaded, and fatigue. [place] Hollis, FNP notified and per provider pt can come to the day hospital today. Pt states that he has transportation to and from the clinic without driving himself.

## 2022-12-29 NOTE — H&P (Signed)
H&P  Patient Demographics:  [name] [name], is a 42 y.o. male  MRN: [id number]   DOB - 06-22-1980  Admit Date - 12/29/2022  Outpatient Primary MD for the patient is Massie Maroon, FNP  No chief complaint on file.     HPI:   [name] [name]  is a 42 y.o. male    Review of systems:  In addition to the HPI above, patient reports No fever or chills No Headache, No changes with vision or hearing No problems swallowing food or liquids No chest pain, cough or shortness of breath No abdominal pain, No nausea or vomiting, Bowel movements are regular No blood in stool or urine No dysuria No new skin rashes or bruises No new joints pains-aches No new weakness, tingling, numbness in any extremity No recent weight gain or loss No polyuria, polydypsia or polyphagia No significant Mental Stressors  A full 10 point Review of Systems was done, except as stated above, all other Review of Systems were negative.  With Past History of the following :   Past Medical History:  Diagnosis Date   Abscess of right iliac fossa 09/24/2018   required Perc Drain 09/24/2018   Arachnoid Cyst of brain bilaterally    "2 really small ones in the back of my head; inside; saw them w/MRI" (09/25/2012)   Bacterial pneumonia ~ 2012   "caught it here in the hospital" (09/25/2012)   Chronic kidney disease    "from my sickle cell" (09/25/2012)   CKD (chronic kidney disease), stage II    Colitis 04/19/2017   CT scan abd/ pelvis   GERD (gastroesophageal reflux disease)    "after I eat alot of spicey foods" (09/25/2012)   Gynecomastia, male 07/10/2012   History of blood transfusion    "always related to sickle cell crisis" (09/25/2012)   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   Noted in noted from Hematology notes at [facility]   Migraines    "take RX qd to prevent them" (09/25/2012)   Opioid dependence with withdrawal (HCC) 08/30/2012   Renal insufficiency    Sickle cell anemia (HCC)    Sickle  cell crisis (HCC) 09/25/2012   Sickle cell nephropathy (HCC) 07/10/2012   Sinus tachycardia    Tachycardia with heart rate 121-140 beats per minute with ambulation 08/04/2016      Past Surgical History:  Procedure Laterality Date   ABCESS DRAINAGE     CHOLECYSTECTOMY  ~ 2012   COLONOSCOPY N/A 04/23/2017   Procedure: COLONOSCOPY;  Surgeon: Hilarie Fredrickson, MD;  Location: [facility];  Service: Endoscopy;  Laterality: N/A;   IR FLUORO GUIDE CV LINE RIGHT  12/17/2016   IR FLUORO GUIDE CV LINE RIGHT  02/07/2021   IR RADIOLOGIST EVAL & MGMT  10/02/2018   IR RADIOLOGIST EVAL & MGMT  10/15/2018   IR REMOVAL TUN CV CATH W/O FL  12/21/2016   IR REMOVAL TUN CV CATH W/O FL  02/11/2021   IR US GUIDE VASC ACCESS RIGHT  12/17/2016   IR US GUIDE VASC ACCESS RIGHT  02/07/2021   spleenectomy       Social History:   Social History   Tobacco Use   Smoking status: Some Days    Current packs/day: 0.10    Types: Cigarettes   Smokeless tobacco: Never  Substance Use Topics   Alcohol use: Yes    Comment: Once a month     Lives - At home   Family History :   Family History  Problem  Relation Age of Onset   Breast cancer Mother      Home Medications:   Prior to Admission medications   Medication Sig Start Date End Date Taking? Authorizing Provider  amitriptyline (ELAVIL) 25 MG tablet Take 2 tablets (50 mg total) by mouth at bedtime. 08/10/22  Yes Massie Maroon, FNP  folic acid (FOLVITE) 1 MG tablet TAKE 1 TABLET BY MOUTH EVERY DAY Patient taking differently: Take 1 mg by mouth every morning. 03/22/20  Yes Massie Maroon, FNP  hydroxyurea (HYDREA) 500 MG capsule Take 1 capsule by mouth in the morning on Sun/Tues/Wed/Thurs/Sat and 2 capsules on Mon/Fri 10/20/22  Yes Massie Maroon, FNP  oxyCODONE (OXYCONTIN) 15 mg 12 hr tablet Take 1 tablet (15 mg) by mouth every 12 hours. 12/25/22  Yes Massie Maroon, FNP  Oxycodone HCl 10 MG TABS Take 1 tablet (10 mg total) by mouth See admin instructions.  Take 10 mg by mouth every 6 hours 12/13/22  Yes Massie Maroon, FNP  allopurinol (ZYLOPRIM) 100 MG tablet Take 0.5 tablets (50 mg total) by mouth daily. Patient not taking: Reported on 09/14/2022 07/01/22   Rometta Emery, MD  calcitRIOL (ROCALTROL) 0.25 MCG capsule Take 0.25 mcg by mouth daily with breakfast. 11/16/20   [provider]  Deferiprone, Twice Daily, (FERRIPROX TWICE-A-DAY) 1000 MG TABS Take 3,000 mg by mouth daily. 05/01/22   Massie Maroon, FNP  sodium bicarbonate 650 MG tablet Take 1,950 mg by mouth with breakfast, with lunch, and with evening meal. 11/07/18   [provider]  TYLENOL 8 HOUR ARTHRITIS PAIN 650 MG CR tablet Take 650 mg by mouth every 8 (eight) hours as needed for pain.    [provider]  VELTASSA 16.8 g PACK Take 16.8 g by mouth daily. 10/10/21   [provider]     Allergies:   Allergies  Allergen Reactions   Nsaids Other (See Comments)    Kidney disease   Morphine And Codeine Other (See Comments)    "Really bad headaches"      Physical Exam:   Vitals:   Vitals:   12/29/22 1321 12/29/22 1544  BP: 102/66 122/78  Pulse: 90 100  Resp: 16 16  Temp: 97.7 F (36.5 C) 97.6 F (36.4 C)  SpO2: 100% 100%    Physical Exam: Constitutional: Patient appears well-developed and well-nourished. Not in obvious distress. HENT: Normocephalic, atraumatic, External right and left ear normal. Oropharynx is clear and moist.  Eyes: Conjunctivae and EOM are normal. PERRLA, no scleral icterus. Neck: Normal ROM. Neck supple. No JVD. No tracheal deviation. No thyromegaly. CVS: RRR, S1/S2 +, no murmurs, no gallops, no carotid bruit.  Pulmonary: Effort and breath sounds normal, no stridor, rhonchi, wheezes, rales.  Abdominal: Soft. BS +, no distension, tenderness, rebound or guarding.  Musculoskeletal: Normal range of motion. No edema and no tenderness.  Lymphadenopathy: No lymphadenopathy noted, cervical, inguinal or  axillary Neuro: Alert. Normal reflexes, muscle tone coordination. No cranial nerve deficit. Skin: Skin is warm and dry. No rash noted. Not diaphoretic. No erythema. No pallor. Psychiatric: Normal mood and affect. Behavior, judgment, thought content normal.   Data Review:   CBC Recent Labs  Lab 12/27/22 1329 12/28/22 1120 12/29/22 1000  WBC 10.6* 10.3 10.0  HGB 7.5* 7.6* 7.0*  HCT 21.9* 22.6* 20.7*  PLT 169 191 143*  MCV 106.3* 107.1* 106.7*  MCH 36.4* 36.0* 36.1*  MCHC 34.2 33.6 33.8  RDW Not Measured Not Measured Not Measured  LYMPHSABS  5.0* 4.6*  --   MONOABS 0.6 0.5  --   EOSABS 0.1 0.1  --   BASOSABS 0.0 0.0  --    ------------------------------------------------------------------------------------------------------------------  Chemistries  Recent Labs  Lab 12/27/22 1329 12/28/22 1120 12/28/22 1605 12/29/22 1000  NA 135 136  --  135  K 5.3* 4.7  --  4.4  CL 105 105  --  105  CO2 20* 21*  --  21*  GLUCOSE 104* 99  --  107*  BUN 59* 58*  --  54*  CREATININE 3.38* 3.36* 3.38* 3.45*  CALCIUM 9.6 8.9  --  8.9  AST 45* 45*  --  45*  ALT 57* 52*  --  48*  ALKPHOS 195* 195*  --  196*  BILITOT 1.7* 1.4*  --  1.7*   ------------------------------------------------------------------------------------------------------------------ CrCl cannot be calculated (Unknown ideal weight.). ------------------------------------------------------------------------------------------------------------------ No results for input(s): "TSH", "T4TOTAL", "T3FREE", "THYROIDAB" in the last 72 hours.  Invalid input(s): "FREET3"  Coagulation profile No results for input(s): "INR", "PROTIME" in the last 168 hours. ------------------------------------------------------------------------------------------------------------------- No results for input(s): "DDIMER" in the last 72  hours. -------------------------------------------------------------------------------------------------------------------  Cardiac Enzymes No results for input(s): "CKMB", "TROPONINI", "MYOGLOBIN" in the last 168 hours.  Invalid input(s): "CK" ------------------------------------------------------------------------------------------------------------------ No results found for: "BNP"  ---------------------------------------------------------------------------------------------------------------  Urinalysis    Component Value Date/Time   COLORURINE YELLOW 04/03/2022 1011   APPEARANCEUR CLEAR 04/03/2022 1011   LABSPEC 1.008 04/03/2022 1011   PHURINE 8.0 04/03/2022 1011   GLUCOSEU 50 (A) 04/03/2022 1011   HGBUR NEGATIVE 04/03/2022 1011   HGBUR small 03/10/2010 1445   BILIRUBINUR NEGATIVE 04/03/2022 1011   BILIRUBINUR negative 04/19/2021 1217   BILIRUBINUR neg 09/30/2020 1117   KETONESUR NEGATIVE 04/03/2022 1011   PROTEINUR NEGATIVE 04/03/2022 1011   UROBILINOGEN 0.2 04/19/2021 1217   UROBILINOGEN 0.2 05/11/2017 1335   NITRITE NEGATIVE 04/03/2022 1011   LEUKOCYTESUR NEGATIVE 04/03/2022 1011    ----------------------------------------------------------------------------------------------------------------   Imaging Results:    No results found.   Assessment & Plan:  Active Problems:   Sickle cell pain crisis (HCC)   Hb Sickle Cell Disease with crisis: Admit patient, start IVF D5 .45% Saline @ 125 mls/hour, start weight based Dilaudid PCA, start IV Toradol 15 mg Q 6 H, Restart oral home pain medications, Monitor vitals very closely, Re-evaluate pain scale regularly, 2 L of Oxygen by [place], Patient will be re-evaluated for pain in the context of function and relationship to baseline as care progresses. Leukocytosis:  Anemia of Chronic Disease:  Chronic pain Syndrome:    DVT Prophylaxis: Subcut Lovenox   AM Labs Ordered, also please review Full Orders  Family  Communication: Admission, patient's condition and plan of care including tests being ordered have been discussed with the patient who indicate understanding and agree with the plan and Code Status.  Code Status: Full Code  Consults called: None    Admission status: Inpatient    Time spent in minutes : 30 minutes  [name]  APRN, MSN, FNP-C Patient Care [facility] [address] [place], [place] [address] [phone]  12/29/2022 at 4:13 PM

## 2022-12-30 ENCOUNTER — Encounter (HOSPITAL_COMMUNITY): Payer: Self-pay | Admitting: Family Medicine

## 2022-12-30 ENCOUNTER — Other Ambulatory Visit: Payer: Self-pay

## 2022-12-30 DIAGNOSIS — Z9049 Acquired absence of other specified parts of digestive tract: Secondary | ICD-10-CM | POA: Diagnosis not present

## 2022-12-30 DIAGNOSIS — G43909 Migraine, unspecified, not intractable, without status migrainosus: Secondary | ICD-10-CM | POA: Diagnosis present

## 2022-12-30 DIAGNOSIS — N184 Chronic kidney disease, stage 4 (severe): Secondary | ICD-10-CM | POA: Diagnosis present

## 2022-12-30 DIAGNOSIS — F17210 Nicotine dependence, cigarettes, uncomplicated: Secondary | ICD-10-CM | POA: Diagnosis present

## 2022-12-30 DIAGNOSIS — D5700 Hb-SS disease with crisis, unspecified: Secondary | ICD-10-CM | POA: Diagnosis present

## 2022-12-30 DIAGNOSIS — Z888 Allergy status to other drugs, medicaments and biological substances status: Secondary | ICD-10-CM | POA: Diagnosis not present

## 2022-12-30 DIAGNOSIS — Z885 Allergy status to narcotic agent status: Secondary | ICD-10-CM | POA: Diagnosis not present

## 2022-12-30 DIAGNOSIS — Z79899 Other long term (current) drug therapy: Secondary | ICD-10-CM | POA: Diagnosis not present

## 2022-12-30 DIAGNOSIS — Z9081 Acquired absence of spleen: Secondary | ICD-10-CM | POA: Diagnosis not present

## 2022-12-30 DIAGNOSIS — G894 Chronic pain syndrome: Secondary | ICD-10-CM | POA: Diagnosis present

## 2022-12-30 DIAGNOSIS — Z803 Family history of malignant neoplasm of breast: Secondary | ICD-10-CM | POA: Diagnosis not present

## 2022-12-30 DIAGNOSIS — N179 Acute kidney failure, unspecified: Secondary | ICD-10-CM | POA: Diagnosis present

## 2022-12-30 LAB — CBC
HCT: 18.4 % — ABNORMAL LOW (ref 39.0–52.0)
Hemoglobin: 6.4 g/dL — CL (ref 13.0–17.0)
MCH: 36.6 pg — ABNORMAL HIGH (ref 26.0–34.0)
MCHC: 34.8 g/dL (ref 30.0–36.0)
MCV: 105.1 fL — ABNORMAL HIGH (ref 80.0–100.0)
Platelets: 148 10*3/uL — ABNORMAL LOW (ref 150–400)
RBC: 1.75 MIL/uL — ABNORMAL LOW (ref 4.22–5.81)
RDW: 29.2 % — ABNORMAL HIGH (ref 11.5–15.5)
WBC: 10.8 10*3/uL — ABNORMAL HIGH (ref 4.0–10.5)
nRBC: 0.2 % (ref 0.0–0.2)

## 2022-12-30 LAB — BASIC METABOLIC PANEL
Anion gap: 8 (ref 5–15)
BUN: 49 mg/dL — ABNORMAL HIGH (ref 6–20)
CO2: 18 mmol/L — ABNORMAL LOW (ref 22–32)
Calcium: 8.4 mg/dL — ABNORMAL LOW (ref 8.9–10.3)
Chloride: 106 mmol/L (ref 98–111)
Creatinine, Ser: 3.11 mg/dL — ABNORMAL HIGH (ref 0.61–1.24)
GFR, Estimated: 25 mL/min — ABNORMAL LOW (ref >60)
Glucose, Bld: 105 mg/dL — ABNORMAL HIGH (ref 70–99)
Potassium: 4.6 mmol/L (ref 3.5–5.1)
Sodium: 132 mmol/L — ABNORMAL LOW (ref 135–145)

## 2022-12-30 LAB — DIFFERENTIAL
Abs Immature Granulocytes: 0.04 10*3/uL (ref 0.00–0.07)
Basophils Absolute: 0 10*3/uL (ref 0.0–0.1)
Basophils Relative: 0 %
Eosinophils Absolute: 0.1 10*3/uL (ref 0.0–0.5)
Eosinophils Relative: 1 %
Immature Granulocytes: 0 %
Lymphocytes Relative: 49 %
Lymphs Abs: 5.3 10*3/uL — ABNORMAL HIGH (ref 0.7–4.0)
Monocytes Absolute: 0.6 10*3/uL (ref 0.1–1.0)
Monocytes Relative: 6 %
Neutro Abs: 4.8 10*3/uL (ref 1.7–7.7)
Neutrophils Relative %: 44 %

## 2022-12-30 LAB — PREPARE RBC (CROSSMATCH)

## 2022-12-30 LAB — RETICULOCYTES
Immature Retic Fract: 19.8 % — ABNORMAL HIGH (ref 2.3–15.9)
RBC.: 1.75 MIL/uL — ABNORMAL LOW (ref 4.22–5.81)
Retic Count, Absolute: 121.1 10*3/uL (ref 19.0–186.0)
Retic Ct Pct: 6.9 % — ABNORMAL HIGH (ref 0.4–3.1)

## 2022-12-30 MED ORDER — HYDROMORPHONE HCL 2 MG/ML IJ SOLN: 2.0000 mg | INTRAMUSCULAR | Status: DC | PRN

## 2022-12-30 MED ORDER — SODIUM CHLORIDE 0.9% IV SOLUTION: Freq: Once | INTRAVENOUS | Status: AC

## 2022-12-30 MED ORDER — APIXABAN 5 MG PO TABS
5.0000 mg | ORAL_TABLET | Freq: Two times a day (BID) | ORAL | Status: DC
  Administered 2022-12-30 – 2022-12-31 (×3): 5 mg via ORAL
  Filled 2022-12-30 (×3): qty 1

## 2022-12-30 NOTE — Plan of Care (Signed)
Pt is A&O x 4. VSS, on room air. C/o pain to bilateral legs and prn pain medications given as ordered. Ambulating independently in the room.   1478: lab called for critical hgb level of 6.4. pt asymptomatic. Anthoney Harada, NP notified and per NP- Transfuse if <6 per MD notes.   Educated pt on falls and safety precautions, plan of care. Pt verbalizes understanding. Call bell in reach. Will continue to monitor.     Problem: Education: Goal: Knowledge of General Education information will improve Description: Including pain rating scale, medication(s)/side effects and non-pharmacologic comfort measures Outcome: Progressing   Problem: Health Behavior/Discharge Planning: Goal: Ability to manage health-related needs will improve Outcome: Progressing   Problem: Clinical Measurements: Goal: Ability to maintain clinical measurements within normal limits will improve Outcome: Progressing Goal: Will remain free from infection Outcome: Progressing Goal: Diagnostic test results will improve Outcome: Progressing Goal: Respiratory complications will improve Outcome: Progressing Goal: Cardiovascular complication will be avoided Outcome: Progressing   Problem: Activity: Goal: Risk for activity intolerance will decrease Outcome: Progressing   Problem: Nutrition: Goal: Adequate nutrition will be maintained Outcome: Progressing   Problem: Coping: Goal: Level of anxiety will decrease Outcome: Progressing   Problem: Elimination: Goal: Will not experience complications related to bowel motility Outcome: Progressing Goal: Will not experience complications related to urinary retention Outcome: Progressing   Problem: Pain Management: Goal: General experience of comfort will improve Outcome: Progressing   Problem: Safety: Goal: Ability to remain free from injury will improve Outcome: Progressing   Problem: Skin Integrity: Goal: Risk for impaired skin integrity will decrease Outcome:  Progressing   Problem: Education: Goal: Knowledge of vaso-occlusive preventative measures will improve Outcome: Progressing Goal: Awareness of infection prevention will improve Outcome: Progressing Goal: Awareness of signs and symptoms of anemia will improve Outcome: Progressing Goal: Long-term complications will improve Outcome: Progressing   Problem: Self-Care: Goal: Ability to incorporate actions that prevent/reduce pain crisis will improve Outcome: Progressing   Problem: Bowel/Gastric: Goal: Gut motility will be maintained Outcome: Progressing   Problem: Tissue Perfusion: Goal: Complications related to inadequate tissue perfusion will be avoided or minimized Outcome: Progressing   Problem: Respiratory: Goal: Pulmonary complications will be avoided or minimized Outcome: Progressing Goal: Acute Chest Syndrome will be identified early to prevent complications Outcome: Progressing   Problem: Fluid Volume: Goal: Ability to maintain a balanced intake and output will improve Outcome: Progressing   Problem: Sensory: Goal: Pain level will decrease with appropriate interventions Outcome: Progressing   Problem: Health Behavior: Goal: Postive changes in compliance with treatment and prescription regimens will improve Outcome: Progressing

## 2022-12-30 NOTE — Progress Notes (Signed)
SICKLE CELL SERVICE PROGRESS NOTE  [name] [name]. ZOX:[id number] DOB: Dec 18, 1980 DOA: 12/29/2022 PCP: Massie Maroon, FNP  Assessment/Plan: Principal Problem:   Sickle cell pain crisis (HCC) Active Problems:   CKD (chronic kidney disease), stage IV (HCC)   Acute kidney injury superimposed on chronic kidney disease (HCC)   Chronic pain syndrome   History of migraine headaches  Sickle cell pain crisis: Patient is improving on OxyContin and oxycodone.  Also with IV Dilaudid.  He has decreased pain to 3 out of 10 but hemoglobin dropped to 6.4 from 7.0 last night.  He is getting transfusion. Anemia of chronic disease: Hemoglobin 6.4.  Will transfuse 1 unit of packed red blood cells.  Once hemoglobin is above 7 patient will likely to be discharged.  His baseline is around 7.5 to 7.7 g. Chronic pain syndrome: Continue OxyContin. AKI on CKD 4: Creatinine has slightly improved down to 3.1.  Continue to monitor History of migraine headaches: No headaches at this time around.  Code Status: Full code Family Communication: No family at bedside Disposition Plan: Home  [facility]  Pager [phone]. If 7PM-7AM, please contact night-coverage.  12/30/2022, 12:45 PM  LOS: 0 days   Brief narrative: [name] [name]  is a 42 y.o. male with a medical history significant for sickle cell disease, chronic pain syndrome, opiate dependence and tolerance, and stage IV chronic kidney disease presented to sickle cell day infusion center with pain primarily to lower extremities.  Patient also endorses fatigue and some dizziness.  Patient was treated in sickle cell day infusion clinic on yesterday for pain crisis.  Pain intensity improved at discharge.  He states that he awakened with continued pain and lower extremities, dizziness, and fatigue.  Pain was moderately relieved with oxycodone and OxyContin.  He currently rates pain as 5/10.  Patient has been taking all prescribed medications consistently.   He is followed by hematology at [facility] and [facility] nephrology.  Patient reports that his creatinine levels have been steadily increasing since his [place]ia Phlegm was discontinued. He currently denies fever, chills, chest pain, or shortness of breath.  No urinary symptoms, nausea, vomiting, or diarrhea.  No headache or blurry vision.  No sick contacts or recent travel. Sickle cell day hospital course: Patient's most recent labs notable for creatinine 3.36, AST 45, ALT 48, total bilirubin 1.7, GFR 22.  CBC notable for hemoglobin 7.6, WBCs 10.3, and platelets 191,000.  Patient's pain and fatigue persists despite treatment in day hospital.  Patient transition to [facility] for further management of his sickle cell pain crisis  Consultants: None  Procedures: None  Antibiotics: None  HPI/Subjective: Patient is doing better.  Pain is down to 3 out of 10.  Hemoglobin dropped to 6.4.  No fever no chills no nausea vomiting or diarrhea  Objective: Vitals:   12/29/22 2121 12/30/22 0120 12/30/22 0543 12/30/22 0951  BP: 121/84 108/75 116/71 104/80  Pulse: (!) 102 97 (!) 105 100  Resp: 18 18 18 18   Temp: 98.2 F (36.8 C)   98 F (36.7 C)  TempSrc: Oral   Oral  SpO2: 99% 99% 98% 100%   Weight change:   Intake/Output Summary (Last 24 hours) at 12/30/2022 1245 Last data filed at 12/30/2022 0946 Gross per 24 hour  Intake 1000.15 ml  Output 950 ml  Net 50.15 ml    General: Alert, awake, oriented x3, in no acute distress.  HEENT: [place]/AT PEERL, EOMI Neck: Trachea midline,  no masses, no thyromegal,y no JVD, no  carotid bruit OROPHARYNX:  Moist, No exudate/ erythema/lesions.  Heart: Regular rate and rhythm, without murmurs, rubs, gallops, PMI non-displaced, no heaves or thrills on palpation.  Lungs: Clear to auscultation, no wheezing or rhonchi noted. No increased vocal fremitus resonant to percussion  Abdomen: Soft, nontender, nondistended, positive bowel sounds, no masses no hepatosplenomegaly noted..   Neuro: No focal neurological deficits noted cranial nerves II through XII grossly intact. DTRs 2+ bilaterally upper and lower extremities. Strength 5 out of 5 in bilateral upper and lower extremities. Musculoskeletal: No warm swelling or erythema around joints, no spinal tenderness noted. Psychiatric: Patient alert and oriented x3, good insight and cognition, good recent to remote recall. Lymph node survey: No cervical axillary or inguinal lymphadenopathy noted.   Data Reviewed: Basic Metabolic Panel: Recent Labs  Lab 12/27/22 1329 12/28/22 1120 12/28/22 1605 12/29/22 1000 12/30/22 0151  NA 135 136  --  135 132*  K 5.3* 4.7  --  4.4 4.6  CL 105 105  --  105 106  CO2 20* 21*  --  21* 18*  GLUCOSE 104* 99  --  107* 105*  BUN 59* 58*  --  54* 49*  CREATININE 3.38* 3.36* 3.38* 3.45* 3.11*  CALCIUM 9.6 8.9  --  8.9 8.4*   Liver Function Tests: Recent Labs  Lab 12/27/22 1329 12/28/22 1120 12/29/22 1000  AST 45* 45* 45*  ALT 57* 52* 48*  ALKPHOS 195* 195* 196*  BILITOT 1.7* 1.4* 1.7*  PROT 7.6 7.4 6.9  ALBUMIN 3.6 3.6 3.5   No results for input(s): "LIPASE", "AMYLASE" in the last 168 hours. No results for input(s): "AMMONIA" in the last 168 hours. CBC: Recent Labs  Lab 12/27/22 1329 12/28/22 1120 12/29/22 1000 12/30/22 0151  WBC 10.6* 10.3 10.0 10.8*  NEUTROABS 5.0 5.0  --  4.8  HGB 7.5* 7.6* 7.0* 6.4*  HCT 21.9* 22.6* 20.7* 18.4*  MCV 106.3* 107.1* 106.7* 105.1*  PLT 169 191 143* 148*   Cardiac Enzymes: No results for input(s): "CKTOTAL", "CKMB", "CKMBINDEX", "TROPONINI" in the last 168 hours. BNP (last 3 results) No results for input(s): "BNP" in the last 8760 hours.  ProBNP (last 3 results) No results for input(s): "PROBNP" in the last 8760 hours.  CBG: No results for input(s): "GLUCAP" in the last 168 hours.  No results found for this or any previous visit (from the past 240 hour(s)).   Studies: No results found.  Scheduled Meds:  sodium chloride    Intravenous Once   amitriptyline  50 mg Oral QHS   apixaban  5 mg Oral BID   calcitRIOL  0.25 mcg Oral Q breakfast   Deferiprone (Twice Daily)  3,000 mg Oral Q breakfast   folic acid  1 mg Oral q morning   [START ON 01/01/2023] hydroxyurea  1,000 mg Oral Once per day on Monday Friday   hydroxyurea  500 mg Oral Once per day on Sunday Tuesday Wednesday Thursday Saturday   oxyCODONE  15 mg Oral Q12H   patiromer  16.8 g Oral Daily   senna-docusate  1 tablet Oral BID   sodium bicarbonate  1,950 mg Oral TID with meals   Continuous Infusions:  Principal Problem:   Sickle cell pain crisis (HCC) Active Problems:   CKD (chronic kidney disease), stage IV (HCC)   Acute kidney injury superimposed on chronic kidney disease (HCC)   Chronic pain syndrome   History of migraine headaches

## 2022-12-31 DIAGNOSIS — D5700 Hb-SS disease with crisis, unspecified: Secondary | ICD-10-CM | POA: Diagnosis not present

## 2022-12-31 LAB — BASIC METABOLIC PANEL
Anion gap: 9 (ref 5–15)
BUN: 48 mg/dL — ABNORMAL HIGH (ref 6–20)
CO2: 19 mmol/L — ABNORMAL LOW (ref 22–32)
Calcium: 8.8 mg/dL — ABNORMAL LOW (ref 8.9–10.3)
Chloride: 109 mmol/L (ref 98–111)
Creatinine, Ser: 2.59 mg/dL — ABNORMAL HIGH (ref 0.61–1.24)
GFR, Estimated: 31 mL/min — ABNORMAL LOW (ref >60)
Glucose, Bld: 90 mg/dL (ref 70–99)
Potassium: 5.1 mmol/L (ref 3.5–5.1)
Sodium: 137 mmol/L (ref 135–145)

## 2022-12-31 LAB — CBC
HCT: 23.7 % — ABNORMAL LOW (ref 39.0–52.0)
Hemoglobin: 8.1 g/dL — ABNORMAL LOW (ref 13.0–17.0)
MCH: 33.9 pg (ref 26.0–34.0)
MCHC: 34.2 g/dL (ref 30.0–36.0)
MCV: 99.2 fL (ref 80.0–100.0)
Platelets: 146 10*3/uL — ABNORMAL LOW (ref 150–400)
RBC: 2.39 MIL/uL — ABNORMAL LOW (ref 4.22–5.81)
RDW: 28.4 % — ABNORMAL HIGH (ref 11.5–15.5)
WBC: 8.7 10*3/uL (ref 4.0–10.5)
nRBC: 0.2 % (ref 0.0–0.2)

## 2022-12-31 NOTE — Progress Notes (Signed)
1000 MEDS GIVEN, PIV removed, all belongings collected to patient satisfaction.PT ambulatory with RN to waiting area by main entrance of hospital. VSS, patient appropriate for discharge.

## 2022-12-31 NOTE — Hospital Course (Signed)
Patient was admitted with sickle cell pain crisis.  Also symptomatic anemia.  He has acute on chronic kidney failure.  Patient admitted and started on Dilaudid PCA, Toradol.  Hemoglobin dropped to 6.4.  He has known history of chronic anemia but hemoglobin typically is around 7.5-7.7.  He received 1 unit of packed red blood cells.  Pain has resolved.  Patient appears to be back to his baseline.  At this point is being discharged home with a hemoglobin of 7.2.  He will follow-up with PCP on continue home regimen.

## 2022-12-31 NOTE — Plan of Care (Signed)

## 2022-12-31 NOTE — Discharge Summary (Signed)
Physician Discharge Summary   Patient: [name] [name]. MRN: [id number] DOB: October 25, 1980  Admit date:     12/29/2022  Discharge date: 12/31/2022  Discharge Physician: Lonia Blood   PCP: Massie Maroon, FNP   Recommendations at discharge:   Follow-up with your PCP  Discharge Diagnoses: Principal Problem:   Sickle cell pain crisis (HCC) Active Problems:   CKD (chronic kidney disease), stage IV (HCC)   Acute kidney injury superimposed on chronic kidney disease (HCC)   Chronic pain syndrome   History of migraine headaches  Resolved Problems:   * No resolved hospital problems. [facility] Course: No notes on file  Assessment and Plan: No notes have been filed under this hospital service. Service: Hospitalist        Consultants: None Procedures performed: Blood transfusion Disposition: Home Diet recommendation:  Discharge Diet Orders (From admission, onward)     Start     Ordered   12/31/22 0000  Diet - low sodium heart healthy        12/31/22 0811           Regular diet DISCHARGE MEDICATION: Allergies as of 12/31/2022       Reactions   Nsaids Other (See Comments)   Kidney disease   Morphine And Codeine Other (See Comments)   "Really bad headaches"         Medication List     STOP taking these medications    allopurinol 100 MG tablet Commonly known as: ZYLOPRIM       TAKE these medications    amitriptyline 25 MG tablet Commonly known as: ELAVIL Take 2 tablets (50 mg total) by mouth at bedtime.   apixaban 5 MG Tabs tablet Commonly known as: ELIQUIS Take 5 mg by mouth 2 (two) times daily.   calcitRIOL 0.25 MCG capsule Commonly known as: ROCALTROL Take 0.25 mcg by mouth daily with breakfast.   Ferriprox Twice-A-Day 1000 MG Tabs Generic drug: Deferiprone (Twice Daily) Take 3,000 mg by mouth daily.   folic acid 1 MG tablet Commonly known as: FOLVITE TAKE 1 TABLET BY MOUTH EVERY DAY What changed: when to take this    hydroxyurea 500 MG capsule Commonly known as: HYDREA Take 1 capsule by mouth in the morning on Sun/Tues/Wed/Thurs/Sat and 2 capsules on Mon/Fri   Oxycodone HCl 10 MG Tabs Take 1 tablet (10 mg total) by mouth See admin instructions. Take 10 mg by mouth every 6 hours   oxyCODONE 15 mg 12 hr tablet Commonly known as: OXYCONTIN Take 1 tablet (15 mg) by mouth every 12 hours.   sodium bicarbonate 650 MG tablet Take 1,950 mg by mouth with breakfast, with lunch, and with evening meal.   Tylenol 8 Hour Arthritis Pain 650 MG CR tablet Generic drug: acetaminophen Take 650 mg by mouth every 8 (eight) hours as needed for pain.   Veltassa 16.8 g Pack Generic drug: Patiromer Sorbitex Calcium Take 16.8 g by mouth daily.        Discharge Exam: There were no vitals filed for this visit. Constitutional: NAD, calm, comfortable Eyes: PERRL, lids and conjunctivae normal ENMT: Mucous membranes are moist. Posterior pharynx clear of any exudate or lesions.Normal dentition.  Neck: normal, supple, no masses, no thyromegaly Respiratory: clear to auscultation bilaterally, no wheezing, no crackles. Normal respiratory effort. No accessory muscle use.  Cardiovascular: Regular rate and rhythm, no murmurs / rubs / gallops. No extremity edema. 2+ pedal pulses. No carotid bruits.  Abdomen: no tenderness, no masses palpated. No hepatosplenomegaly.  Bowel sounds positive.  Musculoskeletal: Good range of motion, no joint swelling or tenderness, Skin: no rashes, lesions, ulcers. No induration Neurologic: CN 2-12 grossly intact. Sensation intact, DTR normal. Strength 5/5 in all 4.  Psychiatric: Normal judgment and insight. Alert and oriented x 3. Normal mood   Condition at discharge: good  The results of significant diagnostics from this hospitalization (including imaging, microbiology, ancillary and laboratory) are listed below for reference.   Imaging Studies: No results found.  Microbiology: Results for  orders placed or performed during the hospital encounter of 04/11/21  Resp Panel by RT-PCR (Flu A&B, Covid) Nasopharyngeal Swab     Status: None   Collection Time: 04/11/21  9:36 AM   Specimen: Nasopharyngeal Swab; Nasopharyngeal(NP) swabs in vial transport medium  Result Value Ref Range Status   SARS Coronavirus 2 by RT PCR NEGATIVE NEGATIVE Final    Comment: (NOTE) SARS-CoV-2 target nucleic acids are NOT DETECTED.  The SARS-CoV-2 RNA is generally detectable in upper respiratory specimens during the acute phase of infection. The lowest concentration of SARS-CoV-2 viral copies this assay can detect is 138 copies/mL. A negative result does not preclude SARS-Cov-2 infection and should not be used as the sole basis for treatment or other patient management decisions. A negative result may occur with  improper specimen collection/handling, submission of specimen other than nasopharyngeal swab, presence of viral mutation(s) within the areas targeted by this assay, and inadequate number of viral copies(<138 copies/mL). A negative result must be combined with clinical observations, patient history, and epidemiological information. The expected result is Negative.  Fact Sheet for Patients:  BloggerCourse.com  Fact Sheet for Healthcare Providers:  SeriousBroker.it  This test is no t yet approved or cleared by the [place] FDA and  has been authorized for detection and/or diagnosis of SARS-CoV-2 by FDA under an Emergency Use Authorization (EUA). This EUA will remain  in effect (meaning this test can be used) for the duration of the COVID-19 declaration under Section 564(b)(1) of the Act, 21 U.S.C.section 360bbb-3(b)(1), unless the authorization is terminated  or revoked sooner.       Influenza A by PCR NEGATIVE NEGATIVE Final   Influenza B by PCR NEGATIVE NEGATIVE Final    Comment: (NOTE) The Xpert Xpress SARS-CoV-2/FLU/RSV plus  assay is intended as an aid in the diagnosis of influenza from Nasopharyngeal swab specimens and should not be used as a sole basis for treatment. Nasal washings and aspirates are unacceptable for Xpert Xpress SARS-CoV-2/FLU/RSV testing.  Fact Sheet for Patients: BloggerCourse.com  Fact Sheet for Healthcare Providers: SeriousBroker.it  This test is not yet approved or cleared by the [place] FDA and has been authorized for detection and/or diagnosis of SARS-CoV-2 by FDA under an Emergency Use Authorization (EUA). This EUA will remain in effect (meaning this test can be used) for the duration of the COVID-19 declaration under Section 564(b)(1) of the Act, 21 U.S.C. section 360bbb-3(b)(1), unless the authorization is terminated or revoked.  Performed at [facility], [address] [place], [place] [address]    *Note: Due to a large number of results and/or encounters for the requested time period, some results have not been displayed. A complete set of results can be found in Results Review.    Labs: CBC: Recent Labs  Lab 12/27/22 1329 12/28/22 1120 12/29/22 1000 12/30/22 0151 12/31/22 0622  WBC 10.6* 10.3 10.0 10.8* 8.7  NEUTROABS 5.0 5.0  --  4.8  --   HGB 7.5* 7.6* 7.0* 6.4* 8.1*  HCT 21.9* 22.6* 20.7* 18.4* 23.7*  MCV 106.3* 107.1* 106.7* 105.1* 99.2  PLT 169 191 143* 148* 146*   Basic Metabolic Panel: Recent Labs  Lab 12/27/22 1329 12/28/22 1120 12/28/22 1605 12/29/22 1000 12/30/22 0151  NA 135 136  --  135 132*  K 5.3* 4.7  --  4.4 4.6  CL 105 105  --  105 106  CO2 20* 21*  --  21* 18*  GLUCOSE 104* 99  --  107* 105*  BUN 59* 58*  --  54* 49*  CREATININE 3.38* 3.36* 3.38* 3.45* 3.11*  CALCIUM 9.6 8.9  --  8.9 8.4*   Liver Function Tests: Recent Labs  Lab 12/27/22 1329 12/28/22 1120 12/29/22 1000  AST 45* 45* 45*  ALT 57* 52* 48*  ALKPHOS 195* 195* 196*  BILITOT 1.7* 1.4*  1.7*  PROT 7.6 7.4 6.9  ALBUMIN 3.6 3.6 3.5   CBG: No results for input(s): "GLUCAP" in the last 168 hours.  Discharge time spent: greater than patient 30 minutes.  SignedLonia Blood, MD [facility] 12/31/2022

## 2023-01-01 LAB — TYPE AND SCREEN
ABO/RH(D): O POS
Antibody Screen: NEGATIVE
Donor AG Type: NEGATIVE
Unit division: 0

## 2023-01-01 LAB — BPAM RBC
Blood Product Expiration Date: 202412122359
ISSUE DATE / TIME: 202411161546
Unit Type and Rh: 5100

## 2023-01-02 ENCOUNTER — Other Ambulatory Visit: Payer: Self-pay | Admitting: Family Medicine

## 2023-01-02 DIAGNOSIS — G894 Chronic pain syndrome: Secondary | ICD-10-CM

## 2023-01-02 DIAGNOSIS — D571 Sickle-cell disease without crisis: Secondary | ICD-10-CM

## 2023-01-02 MED ORDER — OXYCODONE HCL 10 MG PO TABS: 10.0000 mg | ORAL_TABLET | ORAL | 0 refills | Status: DC

## 2023-01-02 NOTE — Telephone Encounter (Signed)
Caller & Relationship to patient:  MRN #  [id number]   Call Back Number:   Date of Last Office Visit: 12/21/2022     Date of Next Office Visit: 01/25/2023    Medication(s) to be Refilled: Oxycodone   Preferred Pharmacy:   ** Please notify patient to allow 48-72 hours to process** **Let patient know to contact pharmacy at the end of the day to make sure medication is ready. ** **If patient has not been seen in a year or longer, book an appointment **Advise to use MyChart for refill requests OR to contact their pharmacy

## 2023-01-02 NOTE — Discharge Summary (Signed)
[facility] Dis[facility]   Patient ID: Given Chidester. MRN: [id number] DOB/AGE: Apr 12, 1980 42 y.o.  Admit date: 12/28/2022 Discharge date: 12/28/2022  Primary Care Physician:  Massie Maroon, FNP  Admission Diagnoses:  Active Problems:   Sickle cell pain crisis [facility])   Discharge Medications:  Allergies as of 12/28/2022       Reactions   Nsaids Other (See Comments)   Kidney disease   Morphine And Codeine Other (See Comments)   "Really bad headaches"         Medication List     TAKE these medications    amitriptyline 25 MG tablet Commonly known as: ELAVIL Take 2 tablets (50 mg total) by mouth at bedtime.   calcitRIOL 0.25 MCG capsule Commonly known as: ROCALTROL Take 0.25 mcg by mouth daily with breakfast.   Ferriprox Twice-A-Day 1000 MG Tabs Generic drug: Deferiprone (Twice Daily) Take 3,000 mg by mouth daily.   folic acid 1 MG tablet Commonly known as: FOLVITE TAKE 1 TABLET BY MOUTH EVERY DAY What changed: when to take this   hydroxyurea 500 MG capsule Commonly known as: HYDREA Take 1 capsule by mouth in the morning on Sun/Tues/Wed/Thurs/Sat and 2 capsules on Mon/Fri   oxyCODONE 15 mg 12 hr tablet Commonly known as: OXYCONTIN Take 1 tablet (15 mg) by mouth every 12 hours.   sodium bicarbonate 650 MG tablet Take 1,950 mg by mouth with breakfast, with lunch, and with evening meal.   Tylenol 8 Hour Arthritis Pain 650 MG CR tablet Generic drug: acetaminophen Take 650 mg by mouth every 8 (eight) hours as needed for pain.   Veltassa 16.8 g Pack Generic drug: Patiromer Sorbitex Calcium Take 16.8 g by mouth daily.         Consults:  None  Significant Diagnostic Studies:  No results found.   History of Present Illness: [name] [name] is a 42 year old male with a medical history significant for sickle cell disease, chronic kidney disease stage IV, anemia of chronic disease, and opiate dependence and tolerance  presents with complaints of generalized pain, fatigue, and "lightheadedness" over the past several days.  Patient states that he has generally "not felt well" over the past several days.  He attributes this to changes in weather.  Patient has been followed closely by his hematology and nephrology team at [facility].  Patient recently underwent surgery for right total knee replacement several weeks prior.  Patient states that he continues to have swelling but is following up with his surgeon regularly.  Patient rates his lower extremity pain as 8/10.  He last had oxycodone this a.m. with some relief.  He denies any fever, chills, chest pain, or shortness of breath.  No urinary symptoms, nausea, vomiting, or diarrhea. [facility] Course: Patient admitted for sickle cell pain crisis. Reviewed all laboratory values, creatinine above patient's baseline at 3.38.  Patient typically ranges between 3.1-3.2.  All other labs consistent with his baseline. Pain managed with IV Dilaudid PCA, Tylenol, and IV fluids. Patient reports some improvement in overall symptoms.  He is requesting to discharge home.  Patient advised to follow-up in day clinic in a.m. if symptoms worsen overnight.  He expressed understanding.  He is alert, oriented, and ambulating without assistance.  Patient will discharge home in a hemodynamically stable condition. Physical Exam at Discharge:  BP 113/71 (BP Location: Left Arm)   Pulse 97   Temp 97.6 F (36.4 C) (Temporal)   Resp 10  SpO2 97%  Physical Exam Constitutional:      Appearance: Normal appearance.  Eyes:     Pupils: Pupils are equal, round, and reactive to light.  Cardiovascular:     Rate and Rhythm: Normal rate and regular rhythm.     Heart sounds: Murmur heard.  Pulmonary:     Effort: Pulmonary effort is normal.  Abdominal:     General: Bowel sounds are normal.  Musculoskeletal:        General: Normal range of motion.  Neurological:     General: No focal  deficit present.     Mental Status: He is alert. Mental status is at baseline.  Psychiatric:        Mood and Affect: Mood normal.        Behavior: Behavior normal.        Thought Content: Thought content normal.        Judgment: Judgment normal.     Disposition at Discharge: Discharge disposition: 01-Home or Self Care       Discharge Orders: Discharge Instructions     Discharge patient   Complete by: As directed    Discharge disposition: 01-Home or Self Care   Discharge patient date: 12/28/2022       Condition at Discharge:   Stable  Time spent on Discharge:  Greater than 30 minutes.  Signed: [name]  APRN, MSN, FNP-C [facility] [address] [place], [place] [address] [phone]  01/02/2023, 4:52 PM

## 2023-01-02 NOTE — Telephone Encounter (Signed)
Reviewed PDMP substance reporting system prior to prescribing opiate medications. No inconsistencies noted.  Meds ordered this encounter  Medications   Oxycodone HCl 10 MG TABS    Sig: Take 1 tablet (10 mg total) by mouth See admin instructions. Take 10 mg by mouth every 6 hours    Dispense:  60 tablet    Refill:  0    Order Specific Question:   Supervising Provider    Answer:   Quentin Angst [[id number]]   [name]  APRN, MSN, FNP-C [facility] [address] [place], [place] [address] [phone]

## 2023-01-04 IMAGING — CR DG CHEST 2V
3 series · 3 of 3 positions shown · non-contrast
Comparison: Chest x-ray 06/23/2021.

CLINICAL DATA: 40-year-old male with history of cough. Sickle cell
disease.

EXAM:
CHEST - 2 VIEW

[w chest lat (1 of 2)]
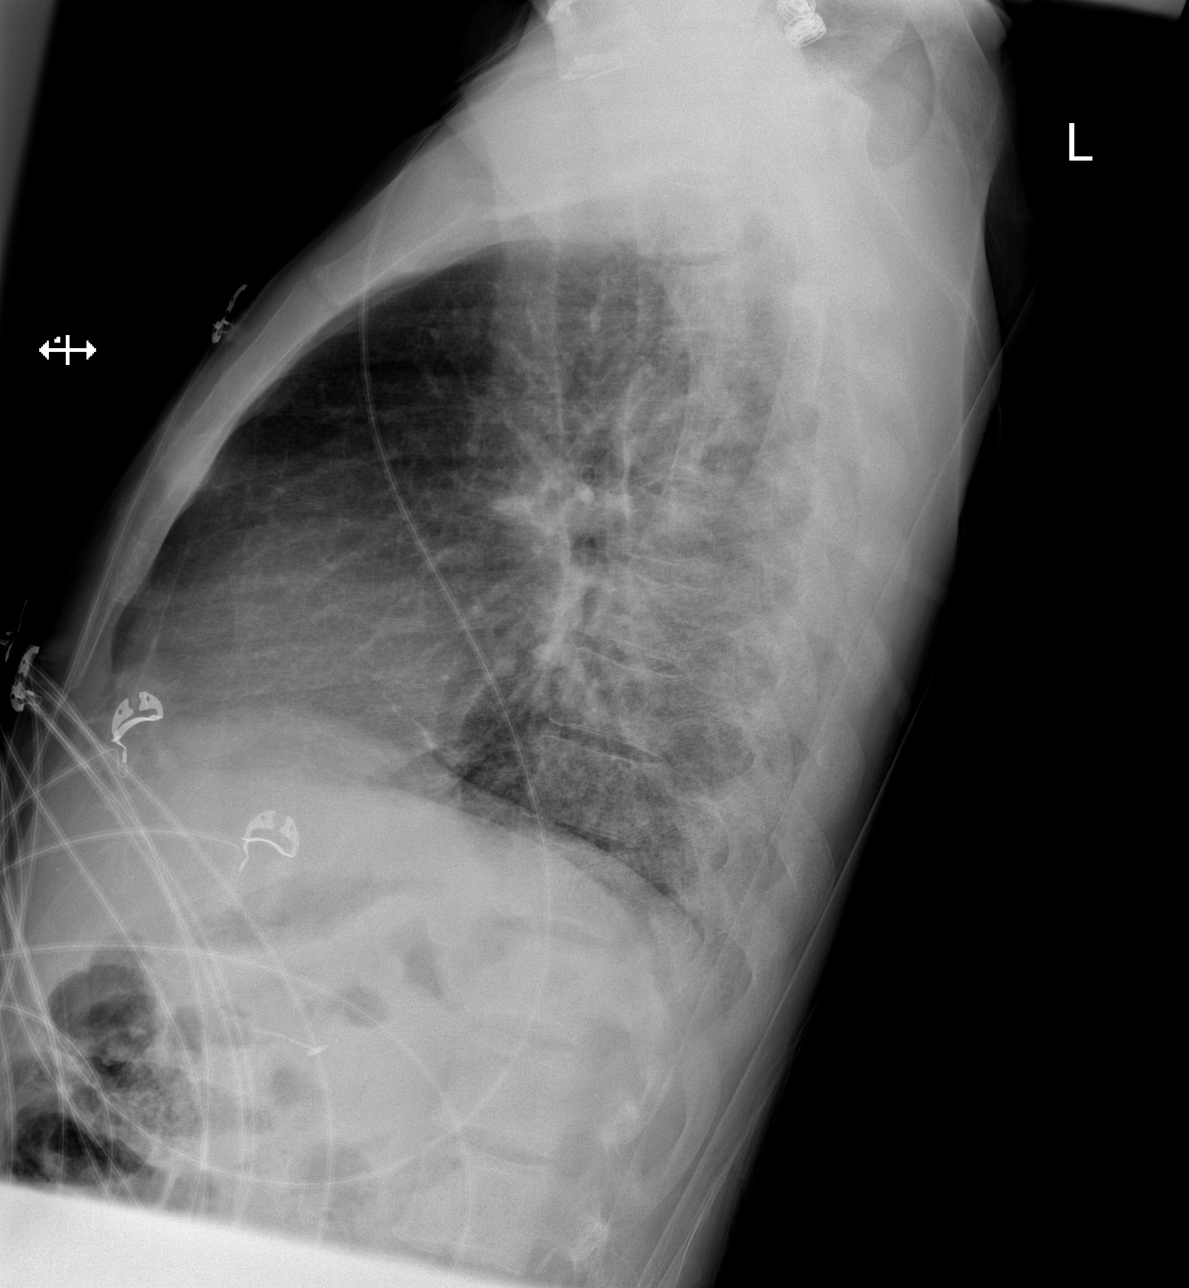

[w chest lat (2 of 2)]
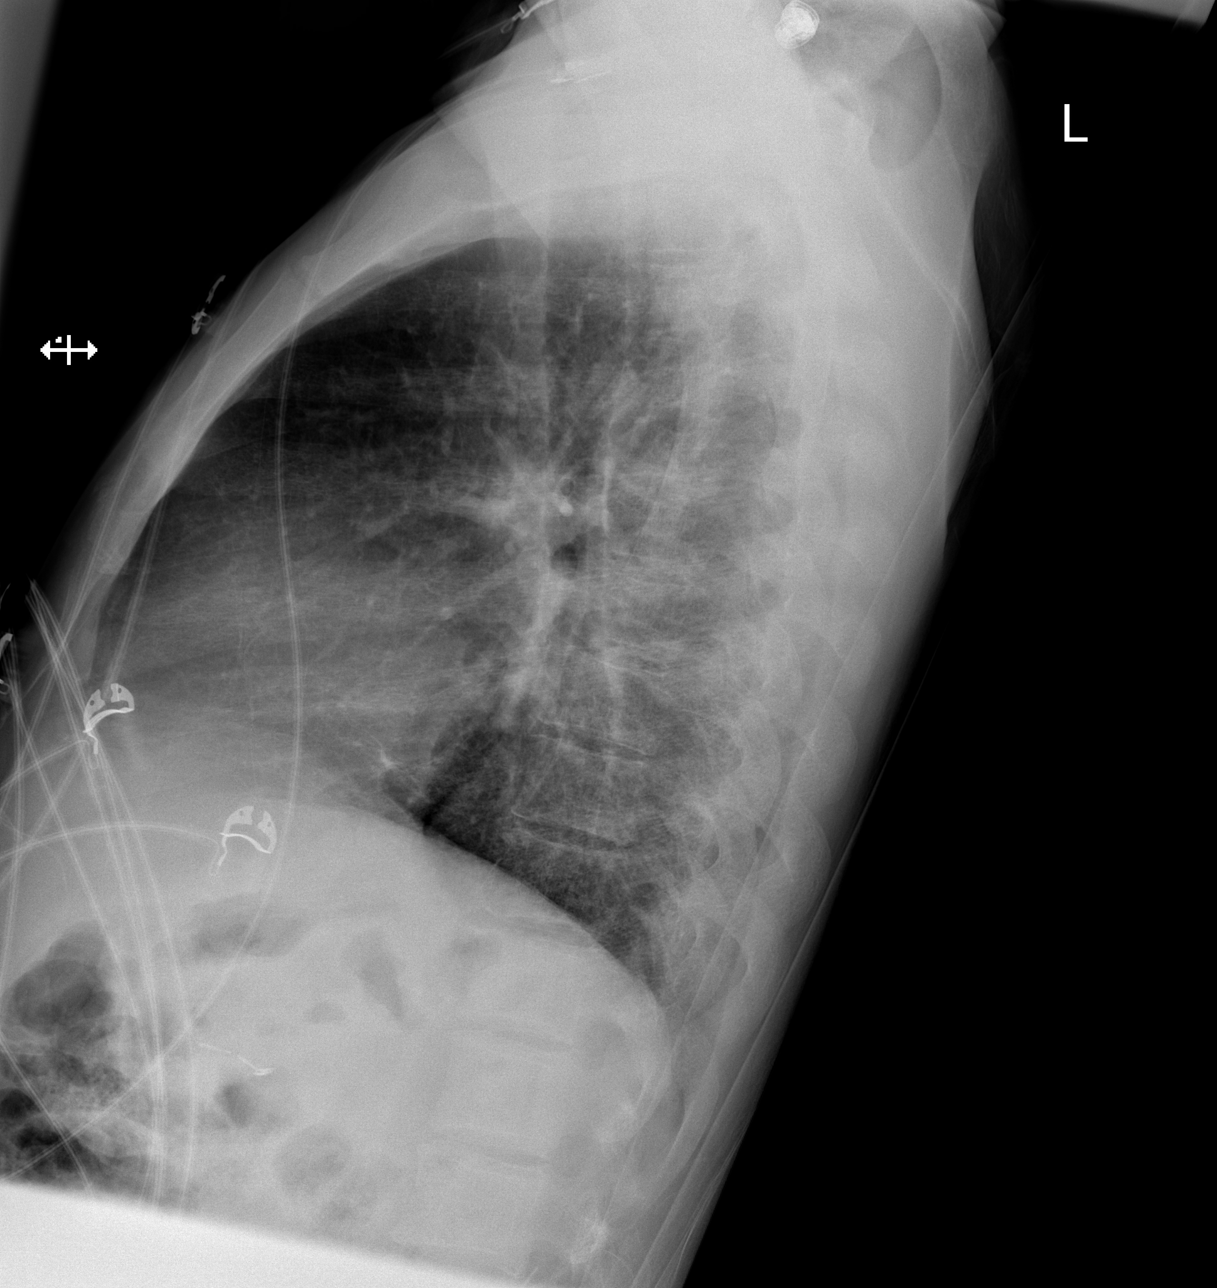

[x chest ap]
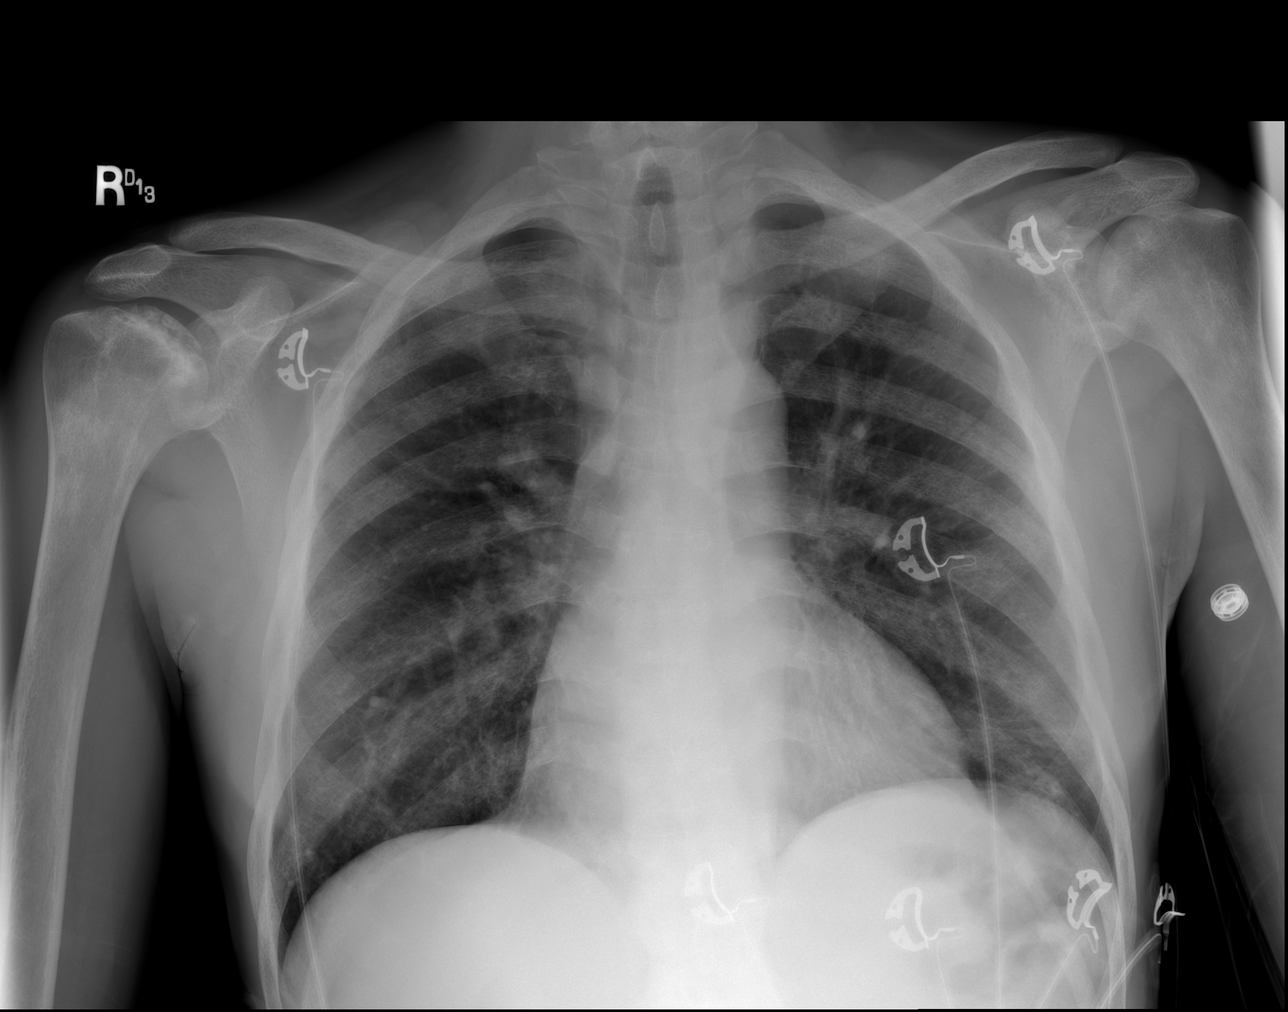

[3 of 3 positions shown; findings below may reference images not displayed]

FINDINGS: Mild diffuse interstitial prominence most evident throughout the mid
to lower lungs bilaterally, stable compared to prior studies, likely
reflective of areas of chronic scarring in this patient with history
of sickle cell disease. No acute consolidative airspace disease. No
pleural effusions. No pneumothorax. No evidence of pulmonary edema.
Heart size is normal. Upper mediastinal contours are within normal
limits. Advanced avascular necrosis in the right humeral head, with
multiple additional areas of bony sclerosis, presumably related to
additional areas of avascular necrosis.
IMPRESSION: 1. Chronic lung changes without radiographic evidence of acute
cardiopulmonary disease, as above.

1.

## 2023-01-10 ENCOUNTER — Non-Acute Institutional Stay (HOSPITAL_COMMUNITY)
Admission: RE | Admit: 2023-01-10 | Discharge: 2023-01-10 | Disposition: A | Discharge status: Home / Self Care | Payer: Medicare Other | Source: Ambulatory Visit | Attending: Internal Medicine | Admitting: Internal Medicine

## 2023-01-10 DIAGNOSIS — D5700 Hb-SS disease with crisis, unspecified: Secondary | ICD-10-CM | POA: Diagnosis present

## 2023-01-10 LAB — COMPREHENSIVE METABOLIC PANEL
ALT: 44 U/L (ref 0–44)
AST: 40 U/L (ref 15–41)
Albumin: 3.4 g/dL — ABNORMAL LOW (ref 3.5–5.0)
Alkaline Phosphatase: 212 U/L — ABNORMAL HIGH (ref 38–126)
Anion gap: 10 (ref 5–15)
BUN: 42 mg/dL — ABNORMAL HIGH (ref 6–20)
CO2: 25 mmol/L (ref 22–32)
Calcium: 9.2 mg/dL (ref 8.9–10.3)
Chloride: 104 mmol/L (ref 98–111)
Creatinine, Ser: 2.99 mg/dL — ABNORMAL HIGH (ref 0.61–1.24)
GFR, Estimated: 26 mL/min — ABNORMAL LOW (ref >60)
Glucose, Bld: 86 mg/dL (ref 70–99)
Potassium: 6.1 mmol/L — ABNORMAL HIGH (ref 3.5–5.1)
Sodium: 139 mmol/L (ref 135–145)
Total Bilirubin: 1.7 mg/dL — ABNORMAL HIGH (ref <1.2)
Total Protein: 7.6 g/dL (ref 6.5–8.1)

## 2023-01-10 LAB — CBC WITH DIFFERENTIAL/PLATELET
Abs Immature Granulocytes: 0.03 10*3/uL (ref 0.00–0.07)
Basophils Absolute: 0 10*3/uL (ref 0.0–0.1)
Basophils Relative: 0 %
Eosinophils Absolute: 0.2 10*3/uL (ref 0.0–0.5)
Eosinophils Relative: 2 %
HCT: 25 % — ABNORMAL LOW (ref 39.0–52.0)
Hemoglobin: 8.4 g/dL — ABNORMAL LOW (ref 13.0–17.0)
Immature Granulocytes: 0 %
Lymphocytes Relative: 42 %
Lymphs Abs: 4.5 10*3/uL — ABNORMAL HIGH (ref 0.7–4.0)
MCH: 35.6 pg — ABNORMAL HIGH (ref 26.0–34.0)
MCHC: 33.6 g/dL (ref 30.0–36.0)
MCV: 105.9 fL — ABNORMAL HIGH (ref 80.0–100.0)
Monocytes Absolute: 0.8 10*3/uL (ref 0.1–1.0)
Monocytes Relative: 7 %
Neutro Abs: 5.2 10*3/uL (ref 1.7–7.7)
Neutrophils Relative %: 49 %
Platelets: 372 10*3/uL (ref 150–400)
RBC: 2.36 MIL/uL — ABNORMAL LOW (ref 4.22–5.81)
WBC: 10.7 10*3/uL — ABNORMAL HIGH (ref 4.0–10.5)
nRBC: 0.4 % — ABNORMAL HIGH (ref 0.0–0.2)

## 2023-01-10 LAB — RETICULOCYTES
Immature Retic Fract: 21.7 % — ABNORMAL HIGH (ref 2.3–15.9)
RBC.: 2.5 MIL/uL — ABNORMAL LOW (ref 4.22–5.81)
Retic Count, Absolute: 145 10*3/uL (ref 19.0–186.0)
Retic Ct Pct: 5.8 % — ABNORMAL HIGH (ref 0.4–3.1)

## 2023-01-10 NOTE — Progress Notes (Signed)
PATIENT CARE CENTER NOTE   Provider: Hollis, [place], FNP   Procedure: Lab work   Note: Patient's labs (CBC w/diff, CMP, Retic) drawn peripherally by [employer]. Patient tolerated well. Patient alert, oriented and ambulatory at discharge.

## 2023-01-16 ENCOUNTER — Other Ambulatory Visit: Payer: Self-pay | Admitting: Family Medicine

## 2023-01-16 DIAGNOSIS — D571 Sickle-cell disease without crisis: Secondary | ICD-10-CM

## 2023-01-16 NOTE — Telephone Encounter (Signed)
Copied from CRM [id number]. Topic: Clinical - Medication Refill >> Jan 16, 2023  8:52 AM Deaijah H wrote: Most Recent Primary Care Visit:  Provider: Massie Maroon  Department: [facility]  Visit Type: OFFICE VISIT  Date: 10/24/2022  Medication: ***  Has the patient contacted their pharmacy?  (Agent: If no, request that the patient contact the pharmacy for the refill. If patient does not wish to contact the pharmacy document the reason why and proceed with request.) (Agent: If yes, when and what did the pharmacy advise?)  Is this the correct pharmacy for this prescription?  If no, delete pharmacy and type the correct one.  This is the patient's preferred pharmacy:  [id number] - [place], [place] - [address] AT [address] [place] [address] Phone: [phone] Fax: [phone]  [id number] [place], [place] - [address] AT [facility] OF [facility] & [address] [place] [address] Phone: [phone] Fax: [phone]   Has the prescription been filled recently?   Is the patient out of the medication?   Has the patient been seen for an appointment in the last year OR does the patient have an upcoming appointment?   Can we respond through MyChart?   Agent: Please be advised that Rx refills may take up to 3 business days. We ask that you follow-up with your pharmacy.

## 2023-01-18 ENCOUNTER — Other Ambulatory Visit: Payer: Self-pay | Admitting: Family Medicine

## 2023-01-18 DIAGNOSIS — G894 Chronic pain syndrome: Secondary | ICD-10-CM

## 2023-01-18 DIAGNOSIS — D571 Sickle-cell disease without crisis: Secondary | ICD-10-CM

## 2023-01-18 MED ORDER — OXYCODONE HCL 10 MG PO TABS: 10.0000 mg | ORAL_TABLET | ORAL | 0 refills | Status: DC

## 2023-01-18 NOTE — Progress Notes (Signed)
Reviewed PDMP substance reporting system prior to prescribing opiate medications. No inconsistencies noted.  Meds ordered this encounter  Medications   Oxycodone HCl 10 MG TABS    Sig: Take 1 tablet (10 mg total) by mouth See admin instructions. Take 10 mg by mouth every 6 hours    Dispense:  60 tablet    Refill:  0    Order Specific Question:   Supervising Provider    Answer:   Quentin Angst [[id number]]   [name]  APRN, MSN, FNP-C Patient Care [facility] [address] [place], [place] [address] [phone]

## 2023-01-23 ENCOUNTER — Encounter (INDEPENDENT_AMBULATORY_CARE_PROVIDER_SITE_OTHER): Payer: Self-pay

## 2023-01-23 ENCOUNTER — Ambulatory Visit: Payer: Self-pay | Admitting: Family Medicine

## 2023-01-25 ENCOUNTER — Other Ambulatory Visit: Payer: Self-pay | Admitting: Nurse Practitioner

## 2023-01-25 ENCOUNTER — Ambulatory Visit (INDEPENDENT_AMBULATORY_CARE_PROVIDER_SITE_OTHER): Payer: Medicare Other | Admitting: Nurse Practitioner

## 2023-01-25 ENCOUNTER — Encounter: Payer: Self-pay | Admitting: Nurse Practitioner

## 2023-01-25 VITALS — BP 113/79 | HR 100 | Temp 97.3°F | Wt 125.6 lb

## 2023-01-25 DIAGNOSIS — F1190 Opioid use, unspecified, uncomplicated: Secondary | ICD-10-CM

## 2023-01-25 DIAGNOSIS — D571 Sickle-cell disease without crisis: Secondary | ICD-10-CM | POA: Diagnosis not present

## 2023-01-25 DIAGNOSIS — G4700 Insomnia, unspecified: Secondary | ICD-10-CM

## 2023-01-25 MED ORDER — AMITRIPTYLINE HCL 25 MG PO TABS: 50.0000 mg | ORAL_TABLET | Freq: Every day | ORAL | 5 refills | Status: DC

## 2023-01-25 MED ORDER — HYDROXYUREA 500 MG PO CAPS: 500.0000 mg | ORAL_CAPSULE | ORAL | 1 refills | Status: AC

## 2023-01-25 MED ORDER — OXYCODONE HCL ER 15 MG PO T12A: 15.0000 mg | EXTENDED_RELEASE_TABLET | Freq: Two times a day (BID) | ORAL | 0 refills | Status: DC

## 2023-01-25 NOTE — Addendum Note (Signed)
Addended by: Ivonne Andrew on: 01/25/2023 11:06 AM   Modules accepted: Orders

## 2023-01-25 NOTE — Progress Notes (Signed)
Subjective   Patient ID: [name] Pick., male    DOB: July 21, 1980, 42 y.o.   MRN: [id number]  Chief Complaint  Patient presents with   Follow-up    Referring provider: Massie Maroon, FNP  [name] Pick. is a 42 y.o. male with Past Medical History: 09/24/2018: Abscess of right iliac fossa     Comment:  required Perc Drain 09/24/2018 No date: Arachnoid Cyst of brain bilaterally     Comment:  "2 really small ones in the back of my head; inside; saw              them w/MRI" (09/25/2012) ~ 2012: Bacterial pneumonia     Comment:  "caught it here in the hospital" (09/25/2012) No date: Chronic kidney disease     Comment:  "from my sickle cell" (09/25/2012) No date: CKD (chronic kidney disease), stage II 04/19/2017: Colitis     Comment:  CT scan abd/ pelvis No date: GERD (gastroesophageal reflux disease)     Comment:  "after I eat alot of spicey foods" (09/25/2012) 07/10/2012: Gynecomastia, male No date: History of blood transfusion     Comment:  "always related to sickle cell crisis" (09/25/2012) 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     Comment:  Noted in noted from Hematology notes at [facility] No date: Migraines     Comment:  "take RX qd to prevent them" (09/25/2012) 08/30/2012: Opioid dependence with withdrawal (HCC) No date: Renal insufficiency No date: Sickle cell anemia (HCC) 09/25/2012: Sickle cell crisis (HCC) 07/10/2012: Sickle cell nephropathy (HCC) No date: Sinus tachycardia 08/04/2016: Tachycardia with heart rate 121-140 beats per minute with  ambulation   HPI  Taryn Furia is a 42 year old male with a medical history significant for sickle cell disease, chronic pain syndrome, opiate dependence and tolerance, stage IV chronic kidney disease and anemia of chronic disease presents for follow-up of chronic conditions. Patient states that he has been doing well and is without complaint on today. His pain has been well-managed  with oxycodone and OxyContin. Patient denies any fever, chills, chest pain, or shortness of breath. No urinary symptoms, nausea, vomiting, or diarrhea. Sees sickle cell clinic in [place] and nephrology.      Allergies  Allergen Reactions   Nsaids Other (See Comments)    Kidney disease   Morphine And Codeine Other (See Comments)    "Really bad headaches"     Immunization History  Administered Date(s) Administered   Influenza Split 12/08/2011   Influenza,inj,Quad PF,6+ Mos 10/11/2012, 12/13/2016, 11/15/2017, 01/06/2019, 01/28/2019, 01/13/2020, 12/15/2020, 01/24/2022   Influenza-Unspecified 10/11/2012, 12/13/2016   Moderna Sars-Covid-2 Vaccination 05/29/2019, 06/26/2019   Pneumococcal Conjugate-13 08/23/2017   Pneumococcal Polysaccharide-23 12/13/2016   Td 09/18/2004   Tdap 06/13/2017    Tobacco History: Social History   Tobacco Use  Smoking Status Former   Current packs/day: 0.10   Types: Cigarettes  Smokeless Tobacco Never   Counseling given: Not Answered   Outpatient Encounter Medications as of 01/25/2023  Medication Sig   calcitRIOL (ROCALTROL) 0.25 MCG capsule Take 0.25 mcg by mouth daily with breakfast.   Deferiprone, Twice Daily, (FERRIPROX TWICE-A-DAY) 1000 MG TABS Take 3,000 mg by mouth daily.   folic acid (FOLVITE) 1 MG tablet TAKE 1 TABLET BY MOUTH EVERY DAY (Patient taking differently: Take 1 mg by mouth every morning.)   Oxycodone HCl 10 MG TABS Take 1 tablet (10 mg total) by mouth See admin  instructions. Take 10 mg by mouth every 6 hours   sodium bicarbonate 650 MG tablet Take 1,950 mg by mouth with breakfast, with lunch, and with evening meal.   TYLENOL 8 HOUR ARTHRITIS PAIN 650 MG CR tablet Take 650 mg by mouth every 8 (eight) hours as needed for pain.   VELTASSA 16.8 g PACK Take 16.8 g by mouth daily.   [DISCONTINUED] amitriptyline (ELAVIL) 25 MG tablet Take 2 tablets (50 mg total) by mouth at bedtime.   [DISCONTINUED] hydroxyurea (HYDREA) 500 MG capsule  Take 1 capsule by mouth in the morning on Sun/Tues/Wed/Thurs/Sat and 2 capsules on Mon/Fri   [DISCONTINUED] oxyCODONE (OXYCONTIN) 15 mg 12 hr tablet Take 1 tablet (15 mg) by mouth every 12 hours.   amitriptyline (ELAVIL) 25 MG tablet Take 2 tablets (50 mg total) by mouth at bedtime.   apixaban (ELIQUIS) 5 MG TABS tablet Take 5 mg by mouth 2 (two) times daily. (Patient not taking: Reported on 01/25/2023)   hydroxyurea (HYDREA) 500 MG capsule Take 1 capsule by mouth in the morning on Sun/Tues/Wed/Thurs/Sat and 2 capsules on Mon/Fri   oxyCODONE (OXYCONTIN) 15 mg 12 hr tablet Take 1 tablet (15 mg) by mouth every 12 hours.   No facility-administered encounter medications on file as of 01/25/2023.    Review of Systems  Review of Systems  Constitutional: Negative.   HENT: Negative.    Cardiovascular: Negative.   Gastrointestinal: Negative.   Allergic/Immunologic: Negative.   Neurological: Negative.   Psychiatric/Behavioral: Negative.       Objective:   BP 113/79   Pulse 100   Temp (!) 97.3 F (36.3 C)   Wt 125 lb 9.6 oz (57 kg)   SpO2 100%   BMI 22.25 kg/m   Wt Readings from Last 5 Encounters:  01/25/23 125 lb 9.6 oz (57 kg)  12/31/22 111 lb 15.9 oz (50.8 kg)  10/24/22 110 lb 12.8 oz (50.3 kg)  09/26/22 115 lb 6.4 oz (52.3 kg)  09/14/22 113 lb (51.3 kg)     Physical Exam Vitals and nursing note reviewed.  Constitutional:      General: He is not in acute distress.    Appearance: He is well-developed.  Cardiovascular:     Rate and Rhythm: Normal rate and regular rhythm.  Pulmonary:     Effort: Pulmonary effort is normal.     Breath sounds: Normal breath sounds.  Skin:    General: Skin is warm and dry.  Neurological:     Mental Status: He is alert and oriented to person, place, and time.       Assessment & Plan:   Hb-SS disease without crisis (HCC) -     Sickle Cell Panel -     oxyCODONE HCl ER; Take 1 tablet (15 mg) by mouth every 12 hours.  Dispense: 60  tablet; Refill: 0  Opioid use -     Drug Screen 11 w/Conf, Ser  Insomnia, unspecified type -     Amitriptyline HCl; Take 2 tablets (50 mg total) by mouth at bedtime.  Dispense: 60 tablet; Refill: 5  Other orders -     Hydroxyurea; Take 1 capsule by mouth in the morning on Sun/Tues/Wed/Thurs/Sat and 2 capsules on Mon/Fri  Dispense: 260 capsule; Refill: 1     Return in about 3 months (around 04/25/2023).   Ivonne Andrew, NP 01/25/2023

## 2023-01-25 NOTE — Patient Instructions (Signed)
1. Hb-SS disease without crisis (HCC) (Primary)  - Sickle Cell Panel - oxyCODONE (OXYCONTIN) 15 mg 12 hr tablet; Take 1 tablet (15 mg) by mouth every 12 hours.  Dispense: 60 tablet; Refill: 0  2. Opioid use  - Drug Screen 11 w/Conf, Ser  3. Insomnia, unspecified type  - amitriptyline (ELAVIL) 25 MG tablet; Take 2 tablets (50 mg total) by mouth at bedtime.  Dispense: 60 tablet; Refill: 5   Follow up:  Follow up in 3 months

## 2023-01-26 LAB — CMP14+CBC/D/PLT+FER+RETIC+V...
ALT: 41 [IU]/L (ref 0–44)
AST: 46 [IU]/L — ABNORMAL HIGH (ref 0–40)
Albumin: 4 g/dL — ABNORMAL LOW (ref 4.1–5.1)
Alkaline Phosphatase: 184 [IU]/L — ABNORMAL HIGH (ref 44–121)
BUN/Creatinine Ratio: 15 (ref 9–20)
BUN: 44 mg/dL — ABNORMAL HIGH (ref 6–24)
Basophils Absolute: 0 10*3/uL (ref 0.0–0.2)
Basos: 0 %
Bilirubin Total: 0.9 mg/dL (ref 0.0–1.2)
CO2: 22 mmol/L (ref 20–29)
Calcium: 9.2 mg/dL (ref 8.7–10.2)
Chloride: 101 mmol/L (ref 96–106)
Creatinine, Ser: 2.89 mg/dL — ABNORMAL HIGH (ref 0.76–1.27)
EOS (ABSOLUTE): 0.1 10*3/uL (ref 0.0–0.4)
Eos: 0 %
Ferritin: 6535 ng/mL — ABNORMAL HIGH (ref 30–400)
Globulin, Total: 2.7 g/dL (ref 1.5–4.5)
Glucose: 112 mg/dL — ABNORMAL HIGH (ref 70–99)
Hematocrit: 26.5 % — ABNORMAL LOW (ref 37.5–51.0)
Hemoglobin: 8.8 g/dL — ABNORMAL LOW (ref 13.0–17.7)
Immature Grans (Abs): 0.1 10*3/uL (ref 0.0–0.1)
Immature Granulocytes: 1 %
Lymphocytes Absolute: 4.6 10*3/uL — ABNORMAL HIGH (ref 0.7–3.1)
Lymphs: 28 %
MCH: 37 pg — ABNORMAL HIGH (ref 26.6–33.0)
MCHC: 33.2 g/dL (ref 31.5–35.7)
MCV: 111 fL — ABNORMAL HIGH (ref 79–97)
Monocytes Absolute: 1.5 10*3/uL — ABNORMAL HIGH (ref 0.1–0.9)
Monocytes: 9 %
Neutrophils Absolute: 10.2 10*3/uL — ABNORMAL HIGH (ref 1.4–7.0)
Neutrophils: 62 %
Platelets: 381 10*3/uL (ref 150–450)
Potassium: 4.8 mmol/L (ref 3.5–5.2)
RBC: 2.38 x10E6/uL — CL (ref 4.14–5.80)
Retic Ct Pct: 6.5 % — ABNORMAL HIGH (ref 0.6–2.6)
Sodium: 138 mmol/L (ref 134–144)
Total Protein: 6.7 g/dL (ref 6.0–8.5)
Vit D, 25-Hydroxy: 17.9 ng/mL — ABNORMAL LOW (ref 30.0–100.0)
WBC: 16.5 10*3/uL — ABNORMAL HIGH (ref 3.4–10.8)
eGFR: 27 mL/min/{1.73_m2} — ABNORMAL LOW (ref >59)

## 2023-01-26 NOTE — Progress Notes (Signed)
 My chart message was sent to pt after confirming last activity date.  KH

## 2023-01-27 ENCOUNTER — Emergency Department (HOSPITAL_COMMUNITY): Payer: Medicare Other

## 2023-01-27 ENCOUNTER — Inpatient Hospital Stay (HOSPITAL_COMMUNITY)
Admission: EM | Admit: 2023-01-27 | Discharge: 2023-01-30 | DRG: 812 | Disposition: A | Discharge status: Home / Self Care | Payer: Medicare Other | Attending: Internal Medicine | Admitting: Internal Medicine

## 2023-01-27 DIAGNOSIS — Z803 Family history of malignant neoplasm of breast: Secondary | ICD-10-CM

## 2023-01-27 DIAGNOSIS — Z9049 Acquired absence of other specified parts of digestive tract: Secondary | ICD-10-CM | POA: Diagnosis not present

## 2023-01-27 DIAGNOSIS — M7989 Other specified soft tissue disorders: Secondary | ICD-10-CM | POA: Diagnosis not present

## 2023-01-27 DIAGNOSIS — Z79899 Other long term (current) drug therapy: Secondary | ICD-10-CM | POA: Diagnosis not present

## 2023-01-27 DIAGNOSIS — I129 Hypertensive chronic kidney disease with stage 1 through stage 4 chronic kidney disease, or unspecified chronic kidney disease: Secondary | ICD-10-CM | POA: Diagnosis present

## 2023-01-27 DIAGNOSIS — G894 Chronic pain syndrome: Secondary | ICD-10-CM | POA: Diagnosis present

## 2023-01-27 DIAGNOSIS — D5700 Hb-SS disease with crisis, unspecified: Secondary | ICD-10-CM | POA: Diagnosis not present

## 2023-01-27 DIAGNOSIS — K219 Gastro-esophageal reflux disease without esophagitis: Secondary | ICD-10-CM | POA: Diagnosis present

## 2023-01-27 DIAGNOSIS — E875 Hyperkalemia: Secondary | ICD-10-CM | POA: Diagnosis present

## 2023-01-27 DIAGNOSIS — Z86718 Personal history of other venous thrombosis and embolism: Secondary | ICD-10-CM | POA: Diagnosis not present

## 2023-01-27 DIAGNOSIS — Z9081 Acquired absence of spleen: Secondary | ICD-10-CM | POA: Diagnosis not present

## 2023-01-27 DIAGNOSIS — Z885 Allergy status to narcotic agent status: Secondary | ICD-10-CM

## 2023-01-27 DIAGNOSIS — Z7901 Long term (current) use of anticoagulants: Secondary | ICD-10-CM

## 2023-01-27 DIAGNOSIS — F1910 Other psychoactive substance abuse, uncomplicated: Secondary | ICD-10-CM | POA: Diagnosis present

## 2023-01-27 DIAGNOSIS — F32A Depression, unspecified: Secondary | ICD-10-CM | POA: Diagnosis present

## 2023-01-27 DIAGNOSIS — D638 Anemia in other chronic diseases classified elsewhere: Secondary | ICD-10-CM | POA: Diagnosis present

## 2023-01-27 DIAGNOSIS — F17200 Nicotine dependence, unspecified, uncomplicated: Secondary | ICD-10-CM | POA: Diagnosis present

## 2023-01-27 DIAGNOSIS — N1832 Chronic kidney disease, stage 3b: Secondary | ICD-10-CM | POA: Diagnosis present

## 2023-01-27 DIAGNOSIS — Z79891 Long term (current) use of opiate analgesic: Secondary | ICD-10-CM | POA: Diagnosis not present

## 2023-01-27 DIAGNOSIS — Z87891 Personal history of nicotine dependence: Secondary | ICD-10-CM | POA: Diagnosis not present

## 2023-01-27 DIAGNOSIS — Z886 Allergy status to analgesic agent status: Secondary | ICD-10-CM | POA: Diagnosis not present

## 2023-01-27 DIAGNOSIS — D5701 Hb-SS disease with acute chest syndrome: Principal | ICD-10-CM

## 2023-01-27 DIAGNOSIS — N184 Chronic kidney disease, stage 4 (severe): Secondary | ICD-10-CM | POA: Diagnosis present

## 2023-01-27 LAB — CBC WITH DIFFERENTIAL/PLATELET
Abs Immature Granulocytes: 0.12 10*3/uL — ABNORMAL HIGH (ref 0.00–0.07)
Basophils Absolute: 0 10*3/uL (ref 0.0–0.1)
Basophils Relative: 0 %
Eosinophils Absolute: 0.1 10*3/uL (ref 0.0–0.5)
Eosinophils Relative: 1 %
HCT: 23 % — ABNORMAL LOW (ref 39.0–52.0)
Hemoglobin: 8 g/dL — ABNORMAL LOW (ref 13.0–17.0)
Immature Granulocytes: 1 %
Lymphocytes Relative: 37 %
Lymphs Abs: 6.3 10*3/uL — ABNORMAL HIGH (ref 0.7–4.0)
MCH: 38.5 pg — ABNORMAL HIGH (ref 26.0–34.0)
MCHC: 34.8 g/dL (ref 30.0–36.0)
MCV: 110.6 fL — ABNORMAL HIGH (ref 80.0–100.0)
Monocytes Absolute: 2 10*3/uL — ABNORMAL HIGH (ref 0.1–1.0)
Monocytes Relative: 12 %
Neutro Abs: 8.7 10*3/uL — ABNORMAL HIGH (ref 1.7–7.7)
Neutrophils Relative %: 49 %
Platelets: 265 10*3/uL (ref 150–400)
RBC: 2.08 MIL/uL — ABNORMAL LOW (ref 4.22–5.81)
WBC: 17.3 10*3/uL — ABNORMAL HIGH (ref 4.0–10.5)
nRBC: 0.3 % — ABNORMAL HIGH (ref 0.0–0.2)

## 2023-01-27 LAB — DRUG SCREEN 764883 11+OXYCO+ALC+CRT-BUND

## 2023-01-27 LAB — RETICULOCYTES
Immature Retic Fract: 36.6 % — ABNORMAL HIGH (ref 2.3–15.9)
RBC.: 2.12 MIL/uL — ABNORMAL LOW (ref 4.22–5.81)
Retic Count, Absolute: 161.8 10*3/uL (ref 19.0–186.0)
Retic Ct Pct: 7.6 % — ABNORMAL HIGH (ref 0.4–3.1)

## 2023-01-27 LAB — BASIC METABOLIC PANEL
Anion gap: 8 (ref 5–15)
BUN: 51 mg/dL — ABNORMAL HIGH (ref 6–20)
CO2: 27 mmol/L (ref 22–32)
Calcium: 8.6 mg/dL — ABNORMAL LOW (ref 8.9–10.3)
Chloride: 104 mmol/L (ref 98–111)
Creatinine, Ser: 2.62 mg/dL — ABNORMAL HIGH (ref 0.61–1.24)
GFR, Estimated: 31 mL/min — ABNORMAL LOW (ref >60)
Glucose, Bld: 98 mg/dL (ref 70–99)
Potassium: 4.6 mmol/L (ref 3.5–5.1)
Sodium: 139 mmol/L (ref 135–145)

## 2023-01-27 LAB — RAPID URINE DRUG SCREEN, HOSP PERFORMED
Amphetamines: NOT DETECTED
Barbiturates: NOT DETECTED
Benzodiazepines: NOT DETECTED
Cocaine: NOT DETECTED
Opiates: POSITIVE — AB
Tetrahydrocannabinol: POSITIVE — AB

## 2023-01-27 LAB — D-DIMER, QUANTITATIVE: D-Dimer, Quant: 2.74 ug{FEU}/mL — ABNORMAL HIGH (ref 0.00–0.50)

## 2023-01-27 LAB — TROPONIN I (HIGH SENSITIVITY)
Troponin I (High Sensitivity): 4 ng/L (ref <18)
Troponin I (High Sensitivity): 4 ng/L (ref <18)

## 2023-01-27 LAB — HIV ANTIBODY (ROUTINE TESTING W REFLEX): HIV Screen 4th Generation wRfx: NONREACTIVE

## 2023-01-27 MED ORDER — HYDROMORPHONE HCL 1 MG/ML IJ SOLN
1.0000 mg | Freq: Once | INTRAMUSCULAR | Status: AC
  Administered 2023-01-27: 1 mg via INTRAVENOUS
  Filled 2023-01-27: qty 1

## 2023-01-27 MED ORDER — NALOXONE HCL 0.4 MG/ML IJ SOLN: 0.4000 mg | INTRAMUSCULAR | Status: DC | PRN

## 2023-01-27 MED ORDER — HYDROMORPHONE HCL 1 MG/ML IJ SOLN
1.0000 mg | INTRAMUSCULAR | Status: DC | PRN
  Administered 2023-01-27: 1 mg via INTRAVENOUS
  Filled 2023-01-27: qty 1

## 2023-01-27 MED ORDER — FOLIC ACID 1 MG PO TABS
1.0000 mg | ORAL_TABLET | Freq: Every morning | ORAL | Status: DC
  Administered 2023-01-28 – 2023-01-30 (×3): 1 mg via ORAL
  Filled 2023-01-27 (×3): qty 1

## 2023-01-27 MED ORDER — SODIUM CHLORIDE 0.9 % IV BOLUS
1000.0000 mL | Freq: Once | INTRAVENOUS | Status: AC
  Administered 2023-01-27: 1000 mL via INTRAVENOUS

## 2023-01-27 MED ORDER — APIXABAN 5 MG PO TABS
5.0000 mg | ORAL_TABLET | Freq: Two times a day (BID) | ORAL | Status: DC
  Administered 2023-01-27 – 2023-01-30 (×6): 5 mg via ORAL
  Filled 2023-01-27 (×6): qty 1

## 2023-01-27 MED ORDER — HYDROMORPHONE 1 MG/ML IV SOLN
INTRAVENOUS | Status: DC
  Administered 2023-01-27 – 2023-01-28 (×2): 30 mg via INTRAVENOUS
  Administered 2023-01-28: 4.5 mg via INTRAVENOUS
  Administered 2023-01-28: 2.5 mg via INTRAVENOUS
  Administered 2023-01-28: 5 mg via INTRAVENOUS
  Administered 2023-01-29: 3 mg via INTRAVENOUS
  Administered 2023-01-29: 3.5 mg via INTRAVENOUS
  Administered 2023-01-29: 0.5 mg via INTRAVENOUS
  Administered 2023-01-29: 1 mg via INTRAVENOUS
  Administered 2023-01-29: 3 mg via INTRAVENOUS
  Administered 2023-01-30: 1 mg via INTRAVENOUS
  Administered 2023-01-30 (×2): 0.5 mg via INTRAVENOUS
  Filled 2023-01-27 (×2): qty 30

## 2023-01-27 MED ORDER — HYDROXYUREA 500 MG PO CAPS
500.0000 mg | ORAL_CAPSULE | ORAL | Status: DC
Start: 2023-01-27 — End: 2023-01-30
  Administered 2023-01-27 – 2023-01-30 (×3): 500 mg via ORAL
  Filled 2023-01-27 (×3): qty 1

## 2023-01-27 MED ORDER — SODIUM CHLORIDE 0.9% FLUSH
9.0000 mL | INTRAVENOUS | Status: DC | PRN
Start: 2023-01-27 — End: 2023-01-30

## 2023-01-27 MED ORDER — SENNOSIDES-DOCUSATE SODIUM 8.6-50 MG PO TABS
1.0000 | ORAL_TABLET | Freq: Two times a day (BID) | ORAL | Status: DC
  Administered 2023-01-28 (×2): 1 via ORAL
  Filled 2023-01-27 (×5): qty 1

## 2023-01-27 MED ORDER — SODIUM CHLORIDE 0.9 % IV SOLN
1.0000 g | Freq: Once | INTRAVENOUS | Status: AC
  Administered 2023-01-27: 1 g via INTRAVENOUS
  Filled 2023-01-27: qty 10

## 2023-01-27 MED ORDER — OXYCODONE HCL ER 15 MG PO T12A
15.0000 mg | EXTENDED_RELEASE_TABLET | Freq: Two times a day (BID) | ORAL | Status: DC
  Administered 2023-01-27 – 2023-01-30 (×6): 15 mg via ORAL
  Filled 2023-01-27 (×6): qty 1

## 2023-01-27 MED ORDER — HYDROXYUREA 500 MG PO CAPS: 500.0000 mg | ORAL_CAPSULE | ORAL | Status: DC

## 2023-01-27 MED ORDER — ONDANSETRON HCL 4 MG/2ML IJ SOLN
4.0000 mg | Freq: Once | INTRAMUSCULAR | Status: AC
Start: 2023-01-27 — End: 2023-01-27
  Administered 2023-01-27: 4 mg via INTRAVENOUS
  Filled 2023-01-27: qty 2

## 2023-01-27 MED ORDER — SODIUM BICARBONATE 650 MG PO TABS
1950.0000 mg | ORAL_TABLET | Freq: Three times a day (TID) | ORAL | Status: DC
  Administered 2023-01-27 – 2023-01-30 (×9): 1950 mg via ORAL
  Filled 2023-01-27 (×9): qty 3

## 2023-01-27 MED ORDER — HYDROXYUREA 500 MG PO CAPS
1000.0000 mg | ORAL_CAPSULE | ORAL | Status: DC
  Administered 2023-01-29: 1000 mg via ORAL
  Filled 2023-01-27: qty 2

## 2023-01-27 MED ORDER — DIPHENHYDRAMINE HCL 25 MG PO CAPS: 25.0000 mg | ORAL_CAPSULE | ORAL | Status: DC | PRN

## 2023-01-27 MED ORDER — SODIUM CHLORIDE 0.9 % IV SOLN
500.0000 mg | Freq: Once | INTRAVENOUS | Status: AC
Start: 2023-01-27 — End: 2023-01-27
  Administered 2023-01-27: 500 mg via INTRAVENOUS
  Filled 2023-01-27: qty 5

## 2023-01-27 MED ORDER — SODIUM CHLORIDE 0.45 % IV SOLN: INTRAVENOUS | Status: AC

## 2023-01-27 MED ORDER — POLYETHYLENE GLYCOL 3350 17 G PO PACK: 17.0000 g | PACK | Freq: Every day | ORAL | Status: DC | PRN

## 2023-01-27 MED ORDER — AMITRIPTYLINE HCL 25 MG PO TABS
75.0000 mg | ORAL_TABLET | Freq: Every day | ORAL | Status: DC
  Administered 2023-01-27 – 2023-01-29 (×3): 75 mg via ORAL
  Filled 2023-01-27 (×3): qty 3

## 2023-01-27 MED ORDER — ONDANSETRON HCL 4 MG/2ML IJ SOLN
4.0000 mg | Freq: Four times a day (QID) | INTRAMUSCULAR | Status: DC | PRN
Start: 2023-01-27 — End: 2023-01-30

## 2023-01-27 MED ORDER — CALCITRIOL 0.25 MCG PO CAPS
0.2500 ug | ORAL_CAPSULE | Freq: Every day | ORAL | Status: DC
Start: 2023-01-28 — End: 2023-01-30
  Administered 2023-01-28 – 2023-01-30 (×3): 0.25 ug via ORAL
  Filled 2023-01-27 (×3): qty 1

## 2023-01-27 NOTE — H&P (Signed)
History and Physical    Patient: [name] [name]. ZOX:[id number] DOB: 1980/08/19 DOA: 01/27/2023 DOS: the patient was seen and examined on 01/27/2023 PCP: Ivonne Andrew, NP  Patient coming from: Home  Chief Complaint:  Chief Complaint  Patient presents with   Sickle Cell Pain Crisis   HPI: [name] [name]. is a 42 y.o. male with medical history significant of sickle cell disease, chronic kidney disease stage IIIb, GERD, depression, chronic pain syndrome, who was seen in the ER today complaining of chest pain shortness of breath and generalized pain consistent with his typical sickle cell crisis.  Patient has been dealing with this for the last few days.  He was in the ER twice.  In the ER patient was evaluated.  Labs and numbers seem to be close to baseline however his chest x-ray is suggested possible segmental consolidation.  Worrisome about pneumonia versus PE.  Also possible acute chest syndrome.  He is therefore being admitted to the hospital for further evaluation and treatment.  Review of Systems: As mentioned in the history of present illness. All other systems reviewed and are negative. Past Medical History:  Diagnosis Date   Abscess of right iliac fossa 09/24/2018   required Perc Drain 09/24/2018   Arachnoid Cyst of brain bilaterally    "2 really small ones in the back of my head; inside; saw them w/MRI" (09/25/2012)   Bacterial pneumonia ~ 2012   "caught it here in the hospital" (09/25/2012)   Chronic kidney disease    "from my sickle cell" (09/25/2012)   CKD (chronic kidney disease), stage II    Colitis 04/19/2017   CT scan abd/ pelvis   GERD (gastroesophageal reflux disease)    "after I eat alot of spicey foods" (09/25/2012)   Gynecomastia, male 07/10/2012   History of blood transfusion    "always related to sickle cell crisis" (09/25/2012)   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   Noted in noted from Hematology notes at [facility]   Migraines     "take RX qd to prevent them" (09/25/2012)   Opioid dependence with withdrawal (HCC) 08/30/2012   Renal insufficiency    Sickle cell anemia (HCC)    Sickle cell crisis (HCC) 09/25/2012   Sickle cell nephropathy (HCC) 07/10/2012   Sinus tachycardia    Tachycardia with heart rate 121-140 beats per minute with ambulation 08/04/2016   Past Surgical History:  Procedure Laterality Date   ABCESS DRAINAGE     CHOLECYSTECTOMY  ~ 2012   COLONOSCOPY N/A 04/23/2017   Procedure: COLONOSCOPY;  Surgeon: Hilarie Fredrickson, MD;  Location: [facility];  Service: Endoscopy;  Laterality: N/A;   IR FLUORO GUIDE CV LINE RIGHT  12/17/2016   IR FLUORO GUIDE CV LINE RIGHT  02/07/2021   IR RADIOLOGIST EVAL & MGMT  10/02/2018   IR RADIOLOGIST EVAL & MGMT  10/15/2018   IR REMOVAL TUN CV CATH W/O FL  12/21/2016   IR REMOVAL TUN CV CATH W/O FL  02/11/2021   IR US GUIDE VASC ACCESS RIGHT  12/17/2016   IR US GUIDE VASC ACCESS RIGHT  02/07/2021   spleenectomy     Social History:  reports that he has quit smoking. His smoking use included cigarettes. He has never used smokeless tobacco. He reports current alcohol use. He reports current drug use. Drug: Marijuana.  Allergies  Allergen Reactions   Nsaids Other (See Comments)    Kidney disease   Morphine And Codeine Other (See Comments)    "  Really bad headaches"     Family History  Problem Relation Age of Onset   Breast cancer Mother     Prior to Admission medications   Medication Sig Start Date End Date Taking? Authorizing Provider  amitriptyline (ELAVIL) 25 MG tablet Take 2 tablets (50 mg total) by mouth at bedtime. Patient taking differently: Take 75 mg by mouth at bedtime. 01/25/23  Yes Ivonne Andrew, NP  calcitRIOL (ROCALTROL) 0.25 MCG capsule Take 0.25 mcg by mouth daily with breakfast. 11/16/20  Yes [provider]  Deferiprone, Twice Daily, (FERRIPROX TWICE-A-DAY) 1000 MG TABS Take 3,000 mg by mouth daily. 05/01/22  Yes Massie Maroon, FNP  folic  acid (FOLVITE) 1 MG tablet TAKE 1 TABLET BY MOUTH EVERY DAY Patient taking differently: Take 1 mg by mouth every morning. 03/22/20  Yes Massie Maroon, FNP  hydroxyurea (HYDREA) 500 MG capsule Take 1 capsule by mouth in the morning on Sun/Tues/Wed/Thurs/Sat and 2 capsules on Mon/Fri 01/25/23  Yes Ivonne Andrew, NP  oxyCODONE (OXYCONTIN) 15 mg 12 hr tablet Take 1 tablet (15 mg) by mouth every 12 hours. 01/25/23  Yes Ivonne Andrew, NP  Oxycodone HCl 10 MG TABS Take 1 tablet (10 mg total) by mouth See admin instructions. Take 10 mg by mouth every 6 hours Patient taking differently: Take 10 mg by mouth every 4 (four) hours as needed (pain). 01/18/23  Yes Massie Maroon, FNP  sodium bicarbonate 650 MG tablet Take 1,950 mg by mouth with breakfast, with lunch, and with evening meal. 11/07/18  Yes [provider]  TYLENOL 8 HOUR ARTHRITIS PAIN 650 MG CR tablet Take 650 mg by mouth every 8 (eight) hours as needed for pain.   Yes [provider]  VELTASSA 16.8 g PACK Take 16.8 g by mouth daily. 10/10/21  Yes [provider]  apixaban (ELIQUIS) 5 MG TABS tablet Take 5 mg by mouth 2 (two) times daily. Patient not taking: Reported on 01/25/2023 11/01/22 02/05/23  [provider]  methylPREDNISolone (MEDROL DOSEPAK) 4 MG TBPK tablet Take by mouth as directed. Patient not taking: Reported on 01/27/2023 01/19/23   [provider]    Physical Exam: Vitals:   01/27/23 0708 01/27/23 0826 01/27/23 1127  BP: 119/82 (!) 116/91 111/69  Pulse: (!) 121 (!) 102 (!) 107  Resp: 16 17 18   Temp: 98.1 F (36.7 C) 98.3 F (36.8 C) 98 F (36.7 C)  TempSrc: Oral  Oral  SpO2: 97% 98% 99%  Weight: 57.2 kg    Height: 5\' 3"  (1.6 m)     Constitutional: NAD, calm, comfortable Eyes: PERRL, lids and conjunctivae normal ENMT: Mucous membranes are moist. Posterior pharynx clear of any exudate or lesions.Normal dentition.  Neck: normal, supple, no masses, no  thyromegaly Respiratory: clear to auscultation bilaterally, no wheezing, no crackles. Normal respiratory effort. No accessory muscle use.  Cardiovascular: Sinus tachycardia, no murmurs / rubs / gallops. No extremity edema. 2+ pedal pulses. No carotid bruits.  Abdomen: no tenderness, no masses palpated. No hepatosplenomegaly. Bowel sounds positive.  Musculoskeletal: Good range of motion, no joint swelling or tenderness, Skin: no rashes, lesions, ulcers. No induration Neurologic: CN 2-12 grossly intact. Sensation intact, DTR normal. Strength 5/5 in all 4.  Psychiatric: Normal judgment and insight. Alert and oriented x 3. Normal mood  Data Reviewed:  BUN 50 and creatinine 2.62, calcium 8.6 white count 17.3, hemoglobin 8.1 platelet count of 265 MCV 110 D-dimer 2.74.  Urine drug screen is positive for THC  and opiates blood cultures negative.  Chest x-ray showed new ill-defined opacity in the periphery of the left lung base which could reflect acute chest syndrome  Assessment and Plan:  #1 sickle cell pain crisis: Patient will be admitted.  Initiate Dilaudid PCA, no Toradol due to chronic kidney disease, IV fluids, chronic home regimen.  Continue to monitor  #2 anemia of chronic disease: Hemoglobin 8.  Continue to monitor  #3 chronic kidney disease stage III: Monitor renal function.  #4 essential hypertension: Blood pressure controlled continue to monitor  #5 sinus tachycardia: Patient will be worked up for possible PE.  Continue to monitor    Advance Care Planning:   Code Status: Full Code   Consults: None  Family Communication: No family at bedside  Severity of Illness: The appropriate patient status for this patient is INPATIENT. Inpatient status is judged to be reasonable and necessary in order to provide the required intensity of service to ensure the patient's safety. The patient's presenting symptoms, physical exam findings, and initial radiographic and laboratory data in the  context of their chronic comorbidities is felt to place them at high risk for further clinical deterioration. Furthermore, it is not anticipated that the patient will be medically stable for discharge from the hospital within 2 midnights of admission.   * I certify that at the point of admission it is my clinical judgment that the patient will require inpatient hospital care spanning beyond 2 midnights from the point of admission due to high intensity of service, high risk for further deterioration and high frequency of surveillance required.*  AuthorLonia Blood, MD 01/27/2023 12:30 PM  For on call review [email or web address].

## 2023-01-27 NOTE — ED Notes (Signed)
ED TO INPATIENT HANDOFF REPORT  Name/Age/Gender [name] [name]. 42 y.o. male  Code Status    Code Status Orders  (From admission, onward)           Start     Ordered   01/27/23 1227  Full code  Continuous       Question:  By:  Answer:  Consent: discussion documented in EHR   01/27/23 1230           Code Status History     Date Active Date Inactive Code Status Order ID Comments User Context   12/29/2022 1602 12/31/2022 1519 Full Code [id number]  Massie Maroon, FNP Inpatient   12/29/2022 0950 12/29/2022 1602 Full Code [id number]  Massie Maroon, FNP Inpatient   12/28/2022 0926 12/28/2022 2225 Full Code [id number]  Massie Maroon, FNP Inpatient   10/05/2022 0910 10/05/2022 2148 Full Code [id number]  Rometta Emery, MD Inpatient   09/04/2022 1234 09/06/2022 1752 Full Code [id number]  Massie Maroon, FNP Inpatient   09/04/2022 0935 09/04/2022 1234 Full Code [id number]  Massie Maroon, FNP Inpatient   08/03/2022 1324 08/05/2022 1827 Full Code [id number]  Massie Maroon, FNP Inpatient   08/03/2022 1057 08/03/2022 1324 Full Code [id number]  Massie Maroon, FNP Inpatient   06/27/2022 1252 07/01/2022 1945 Full Code [id number]  Massie Maroon, FNP Inpatient   06/27/2022 0920 06/27/2022 1252 Full Code [id number]  Massie Maroon, FNP Inpatient   05/09/2022 0927 05/09/2022 2245 Full Code [id number]  Massie Maroon, FNP Inpatient   04/30/2022 1428 05/01/2022 1944 Full Code [id number]  Quentin Angst, MD ED   04/03/2022 1225 04/05/2022 1456 Full Code [id number]  Massie Maroon, FNP ED   03/22/2022 1009 03/22/2022 2130 Full Code [id number]  Massie Maroon, FNP Inpatient   02/22/2022 0926 02/22/2022 2127 Full Code [id number]  Massie Maroon, FNP Inpatient   01/23/2022 0920 01/23/2022 2203 Full Code [id number]  Massie Maroon, FNP Inpatient   01/10/2022 1109 01/10/2022 2035 Full Code [id number]  Massie Maroon, FNP Inpatient   12/26/2021 1927 12/28/2021 1620 Full Code  [id number]  Massie Maroon, FNP Inpatient   12/26/2021 1142 12/26/2021 1927 Full Code [id number]  Massie Maroon, FNP Inpatient   11/16/2021 1010 11/16/2021 2213 Full Code [id number]  Massie Maroon, FNP Inpatient   10/27/2021 0314 10/29/2021 1736 Full Code [id number]  Hillary Bow, DO ED   10/11/2021 2125 10/13/2021 1550 Full Code [id number]  Darlin Drop, DO ED   09/22/2021 0530 09/25/2021 2258 Full Code [id number]  Marinda Elk, MD ED   09/17/2021 1601 09/19/2021 1413 Full Code [id number]  Quentin Angst, MD Inpatient   07/30/2021 1157 08/02/2021 1949 Full Code [id number]  Rometta Emery, MD Inpatient   07/17/2021 2311 07/20/2021 2033 Full Code [id number]  Synetta Fail, MD ED   06/23/2021 1726 06/25/2021 1738 Full Code [id number]  Maurilio Lovely D, DO ED   04/11/2021 1200 04/12/2021 1931 Full Code [id number]  Massie Maroon, FNP ED   02/06/2021 1704 02/11/2021 1952 Full Code [id number]  Bobette Mo, MD ED   02/06/2021 1704 02/06/2021 1704 Full Code [id number]  Bobette Mo, MD ED   12/07/2020 1918 12/10/2020 2036 Full Code [id number]  Zigmund Daniel., MD ED   10/22/2020 2151 10/24/2020 1937 Full Code [id number]  Marinda Elk, MD ED   09/21/2020 1338 09/25/2020 1835 Full Code [id number]  Quentin Angst, MD ED  08/24/2020 1752 08/26/2020 1712 Full Code [id number]  Massie Maroon, FNP Inpatient   08/24/2020 0903 08/24/2020 1752 Full Code [id number]  Massie Maroon, FNP Inpatient   07/30/2020 2024 08/03/2020 1940 Full Code [id number]  Rometta Emery, MD Inpatient   07/01/2020 0858 07/01/2020 2127 Full Code [id number]  Massie Maroon, FNP Inpatient   06/03/2020 0904 06/03/2020 2155 Full Code [id number]  Rometta Emery, MD Inpatient   05/21/2020 1248 05/26/2020 1614 Full Code [id number]  Massie Maroon, FNP Inpatient   04/29/2020 7425 04/29/2020 2148 Full Code [id number]  Massie Maroon, FNP Inpatient   04/10/2020 1418 04/13/2020 1647 Full Code [id number]  Rometta Emery, MD ED   03/22/2020 1617 03/25/2020 1656 Full Code [id number]  Massie Maroon, FNP Inpatient   02/26/2020 1319 02/28/2020 1718 Full Code [id number]  Massie Maroon, FNP Inpatient   02/26/2020 0952 02/26/2020 1319 Full Code [id number]  Massie Maroon, FNP Inpatient   01/06/2020 1822 01/07/2020 2158 Full Code [id number]  Massie Maroon, FNP Inpatient   01/06/2020 0856 01/06/2020 1822 Full Code [id number]  Massie Maroon, FNP Inpatient   12/01/2019 1623 12/02/2019 1612 Full Code [id number]  Massie Maroon, FNP Inpatient   12/01/2019 1209 12/01/2019 1623 Full Code [id number]  Massie Maroon, FNP Inpatient   10/01/2019 0403 10/04/2019 1802 Full Code [id number]  Hillary Bow, DO ED   08/25/2019 1340 08/31/2019 0150 Full Code [id number]  Massie Maroon, FNP Inpatient   08/15/2019 0857 08/15/2019 2019 Full Code [id number]  Massie Maroon, FNP Inpatient   08/02/2019 1001 08/04/2019 1732 Full Code [id number]  Rometta Emery, MD ED   06/27/2019 2221 06/30/2019 1746 Full Code [id number]  Eduard Clos, MD ED   06/12/2019 2247 06/13/2019 1959 Full Code [id number]  Arelia Sneddon, MD ED   06/04/2019 1618 06/04/2019 2352 Full Code [id number]  Massie Maroon, FNP Inpatient   05/06/2019 1648 05/08/2019 1925 Full Code [id number]  Massie Maroon, FNP Inpatient   04/17/2019 1058 04/17/2019 2311 Full Code [id number]  Massie Maroon, FNP Inpatient   03/21/2019 1233 03/22/2019 2053 Full Code [id number]  Massie Maroon, FNP Inpatient   01/22/2019 1350 01/25/2019 1807 Full Code [id number]  Massie Maroon, FNP Inpatient   01/22/2019 1021 01/22/2019 1349 Full Code [id number]  Massie Maroon, FNP Inpatient   01/07/2019 0004 01/08/2019 1826 Full Code [id number]  Eduard Clos, MD ED   12/01/2018 0004 12/03/2018 1359 Full Code [id number]  Gery Pray, MD Inpatient   11/20/2018 2103 11/22/2018 1659 Full Code [id number]  John Giovanni, MD ED   11/05/2018 0934 11/05/2018 1951 Full Code [id number]  Massie Maroon, FNP Inpatient   11/02/2018 2113 11/03/2018 1658 Full Code [id number]  Pearson Grippe, MD ED   09/23/2018 2006 09/28/2018 2028 Full Code [id number]  Delano Metz, MD Inpatient   08/19/2018 0254 08/21/2018 1707 Full Code [id number]  [place], Eric J, DO ED   08/09/2018 1352 08/12/2018 1753 Full Code [id number]  Massie Maroon, FNP ED   07/22/2018 0342 07/24/2018 1522 Full Code [id number]  Hugelmeyer, Ralston, DO Inpatient   07/03/2018 2120 07/06/2018 2032 Full Code [id number]  Eduard Clos, MD ED   05/05/2018 2214 05/08/2018 1455 Full Code [id number]  Eduard Clos, MD ED   03/07/2018 0043 03/11/2018 1656 Full Code [id number]  Lorretta Harp, MD ED   01/21/2018 2008 01/23/2018 1834 Full Code [id number]  Maretta Bees, MD ED   09/19/2017 2214 09/22/2017 1633 Full  Code [id number]  Onnie Boer, MD ED   07/23/2017 2234 07/26/2017 1527 Full Code [id number]  Tonye Royalty, DO Inpatient   05/12/2017 1514 05/18/2017 1850 Full Code [id number]  Quentin Angst, MD Inpatient   04/20/2017 0205 05/03/2017 1855 Full Code [id number]  Eduard Clos, MD ED   04/07/2017 1712 04/09/2017 1238 Full Code [id number]  Meredith Leeds, MD ED   02/27/2017 2140 03/04/2017 1508 Full Code [id number]  Pearson Grippe, MD ED   12/13/2016 1812 12/21/2016 1659 Full Code [id number]  Altha Harm, MD ED   10/14/2016 1944 10/18/2016 1637 Full Code [id number]  Pearson Grippe, MD ED   08/02/2016 0124 08/04/2016 1731 Full Code [id number]  Clydie Braun, MD ED   02/26/2016 2148 02/27/2016 1750 Full Code [id number]  Hillary Bow, DO ED   03/11/2015 1636 03/15/2015 1544 Full Code [id number]  Raliegh Ip, DO ED   11/21/2014 0308 11/24/2014 2020 Full Code [id number]  Melancon, Hillery Hunter, MD Inpatient   08/18/2014 0148 08/20/2014 1820 Full Code [id number]  Joanna Puff, MD Inpatient   03/22/2014 2315 03/24/2014 1826 Full Code [id number]  Therisa Doyne, MD ED   03/19/2014 2048 03/20/2014 2250 Full Code [id number]  Araceli Bouche, DO  Inpatient   12/26/2013 0344 12/29/2013 2103 Full Code [id number]  Elenora Gamma, MD ED   07/28/2013 1833 07/31/2013 1729 Full Code [id number]  Tommie Sams, DO Inpatient   05/09/2013 1926 05/10/2013 1644 Full Code [id number]  Leona Singleton, MD ED   01/24/2013 0143 01/26/2013 1931 Full Code [id number]  Glori Luis, MD Inpatient   09/25/2012 0846 09/26/2012 2020 Full Code [id number]  Natalia Leatherwood, DO ED       Home/SNF/Other Home  Chief Complaint Sickle cell disease with crisis (HCC) [D57.00]  Level of Care/Admitting Diagnosis ED Disposition     ED Disposition  Admit   Condition  --   Comment  Hospital Area: [facility] [[id number]]  Level of Care: Telemetry [5]  Admit to tele based on following criteria: Complex arrhythmia (Bradycardia/Tachycardia)  May admit patient to [facility] or [facility] if equivalent level of care is available:: No  Covid Evaluation: Asymptomatic - no recent exposure (last 10 days) testing not required  Diagnosis: Sickle cell disease with crisis [facility]) [614431]  Admitting Physician: Rometta Emery [[id number]]  Attending Physician: Rometta Emery [[id number]]  Certification:: I certify this patient will need inpatient services for at least 2 midnights  Expected Medical Readiness: 01/30/2023          Medical History Past Medical History:  Diagnosis Date   Abscess of right iliac fossa 09/24/2018   required Perc Drain 09/24/2018   Arachnoid Cyst of brain bilaterally    "2 really small ones in the back of my head; inside; saw them w/MRI" (09/25/2012)   Bacterial pneumonia ~ 2012   "caught it here in the hospital" (09/25/2012)   Chronic kidney disease    "from my sickle cell" (09/25/2012)   CKD (chronic kidney disease), stage II    Colitis 04/19/2017   CT scan abd/ pelvis   GERD (gastroesophageal reflux disease)    "after I eat alot of spicey foods" (09/25/2012)   Gynecomastia, male 07/10/2012   History of blood transfusion     "always related to sickle cell crisis" (09/25/2012)   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   Noted in noted from Hematology notes at [facility]   Migraines    "take RX qd to  prevent them" (09/25/2012)   Opioid dependence with withdrawal (HCC) 08/30/2012   Renal insufficiency    Sickle cell anemia (HCC)    Sickle cell crisis (HCC) 09/25/2012   Sickle cell nephropathy (HCC) 07/10/2012   Sinus tachycardia    Tachycardia with heart rate 121-140 beats per minute with ambulation 08/04/2016    Allergies Allergies  Allergen Reactions   Nsaids Other (See Comments)    Kidney disease   Morphine And Codeine Other (See Comments)    "Really bad headaches"     IV Location/Drains/Wounds Patient Lines/Drains/Airways Status     Active Line/Drains/Airways     Name Placement date Placement time Site Days   Peripheral IV 01/27/23 18 G 2.5" Anterior;Right;Upper Arm 01/27/23  0803  Arm  less than 1            Labs/Imaging Results for orders placed or performed during the hospital encounter of 01/27/23 (from the past 48 hours)  CBC with Differential     Status: Abnormal   Collection Time: 01/27/23  8:07 AM  Result Value Ref Range   WBC 17.3 (H) 4.0 - 10.5 K/uL   RBC 2.08 (L) 4.22 - 5.81 MIL/uL   Hemoglobin 8.0 (L) 13.0 - 17.0 g/dL   HCT 86.5 (L) 78.4 - 69.6 %   MCV 110.6 (H) 80.0 - 100.0 fL   MCH 38.5 (H) 26.0 - 34.0 pg   MCHC 34.8 30.0 - 36.0 g/dL   RDW Not Measured 29.5 - 15.5 %   Platelets 265 150 - 400 K/uL   nRBC 0.3 (H) 0.0 - 0.2 %   Neutrophils Relative % 49 %   Neutro Abs 8.7 (H) 1.7 - 7.7 K/uL   Lymphocytes Relative 37 %   Lymphs Abs 6.3 (H) 0.7 - 4.0 K/uL   Monocytes Relative 12 %   Monocytes Absolute 2.0 (H) 0.1 - 1.0 K/uL   Eosinophils Relative 1 %   Eosinophils Absolute 0.1 0.0 - 0.5 K/uL   Basophils Relative 0 %   Basophils Absolute 0.0 0.0 - 0.1 K/uL   Immature Granulocytes 1 %   Abs Immature Granulocytes 0.12 (H) 0.00 - 0.07 K/uL   Pappenheimer Bodies  PRESENT    Carollee Massed Bodies PRESENT    Sickle Cells PRESENT    Target Cells PRESENT     Comment: Performed at [facility], [address], [place], [place] [address]  Basic metabolic panel     Status: Abnormal   Collection Time: 01/27/23  8:07 AM  Result Value Ref Range   Sodium 139 135 - 145 mmol/L   Potassium 4.6 3.5 - 5.1 mmol/L   Chloride 104 98 - 111 mmol/L   CO2 27 22 - 32 mmol/L   Glucose, Bld 98 70 - 99 mg/dL    Comment: Glucose reference range applies only to samples taken after fasting for at least 8 hours.   BUN 51 (H) 6 - 20 mg/dL   Creatinine, Ser 2.44 (H) 0.61 - 1.24 mg/dL   Calcium 8.6 (L) 8.9 - 10.3 mg/dL   GFR, Estimated 31 (L) >60 mL/min    Comment: (NOTE) Calculated using the CKD-EPI Creatinine Equation (2021)    Anion gap 8 5 - 15    Comment: Performed at [facility], [address], [place], [place] [address]  Reticulocytes     Status: Abnormal   Collection Time: 01/27/23  8:07 AM  Result Value Ref Range   Retic Ct Pct 7.6 (H) 0.4 - 3.1 %  RBC. 2.12 (L) 4.22 - 5.81 MIL/uL   Retic Count, Absolute 161.8 19.0 - 186.0 K/uL   Immature Retic Fract 36.6 (H) 2.3 - 15.9 %    Comment: Performed at [facility], [address], [place], [place] [address]  D-dimer, quantitative     Status: Abnormal   Collection Time: 01/27/23  8:07 AM  Result Value Ref Range   D-Dimer, Quant 2.74 (H) 0.00 - 0.50 ug/mL-FEU    Comment: (NOTE) At the manufacturer cut-off value of 0.5 g/mL FEU, this assay has a negative predictive value of 95-100%.This assay is intended for use in conjunction with a clinical pretest probability (PTP) assessment model to exclude pulmonary embolism (PE) and deep venous thrombosis (DVT) in outpatients suspected of PE or DVT. Results should be correlated with clinical presentation. Performed at [facility], [address] [place], [place] [address]   Troponin I (High  Sensitivity)     Status: None   Collection Time: 01/27/23  8:07 AM  Result Value Ref Range   Troponin I (High Sensitivity) 4 <18 ng/L    Comment: (NOTE) Elevated high sensitivity troponin I (hsTnI) values and significant  changes across serial measurements may suggest ACS but many other  chronic and acute conditions are known to elevate hsTnI results.  Refer to the "Links" section for chest pain algorithms and additional  guidance. Performed at [facility], [address] [place], [place] [address]   Troponin I (High Sensitivity)     Status: None   Collection Time: 01/27/23 10:35 AM  Result Value Ref Range   Troponin I (High Sensitivity) 4 <18 ng/L    Comment: (NOTE) Elevated high sensitivity troponin I (hsTnI) values and significant  changes across serial measurements may suggest ACS but many other  chronic and acute conditions are known to elevate hsTnI results.  Refer to the "Links" section for chest pain algorithms and additional  guidance. Performed at [facility], [address] [place], [place] [address]    *Note: Due to a large number of results and/or encounters for the requested time period, some results have not been displayed. A complete set of results can be found in Results Review.   DG Chest Portable 1 View Result Date: 01/27/2023 CLINICAL DATA:  42 year old male with history of sickle cell disease presenting with sickle cell crisis. EXAM: PORTABLE CHEST 1 VIEW COMPARISON:  Chest x-ray 04/30/2022. FINDINGS: Poorly defined opacity in the periphery of the left lung base. Widespread interstitial prominence and peribronchial cuffing, similar to prior studies, apparently chronic. No pleural effusions. No pneumothorax. No evidence of pulmonary edema. Heart size appears borderline enlarged. Upper mediastinal contours are within normal limits allowing for patient positioning. IMPRESSION: 1. New ill-defined opacity in the periphery of the  left lung base which could reflect acute chest syndrome. Electronically Signed   By: Trudie Reed M.D.   On: 01/27/2023 09:05    Pending Labs Unresulted Labs (From admission, onward)     Start     Ordered   01/27/23 1226  Rapid urine drug screen (hospital performed)  ONCE - STAT,   STAT        01/27/23 1225   01/27/23 0919  Blood culture (routine x 2)  BLOOD CULTURE X 2,   R (with STAT occurrences)      01/27/23 0918   Signed and Held  HIV Antibody (routine testing w rflx)  (HIV Antibody (Routine testing w reflex) panel)  Once,  R        Signed and Held   Signed and Held  Comprehensive metabolic panel  Tomorrow morning,   R        Signed and Held   Signed and Held  CBC with Differential/Platelet  Tomorrow morning,   R        Signed and Held            Vitals/Pain Today's Vitals   01/27/23 0708 01/27/23 0826 01/27/23 1127  BP: 119/82 (!) 116/91 111/69  Pulse: (!) 121 (!) 102 (!) 107  Resp: 16 17 18   Temp: 98.1 F (36.7 C) 98.3 F (36.8 C) 98 F (36.7 C)  TempSrc: Oral  Oral  SpO2: 97% 98% 99%  Weight: 57.2 kg    Height: 5\' 3"  (1.6 m)    PainSc: 10-Worst pain ever      Isolation Precautions No active isolations  Medications Medications  HYDROmorphone (DILAUDID) injection 1 mg (1 mg Intravenous Given 01/27/23 0804)  ondansetron (ZOFRAN) injection 4 mg (4 mg Intravenous Given 01/27/23 0803)  sodium chloride 0.9 % bolus 1,000 mL (0 mLs Intravenous Stopped 01/27/23 1012)  HYDROmorphone (DILAUDID) injection 1 mg (1 mg Intravenous Given 01/27/23 1010)  cefTRIAXone (ROCEPHIN) 1 g in sodium chloride 0.9 % 100 mL IVPB (0 g Intravenous Stopped 01/27/23 1111)  azithromycin (ZITHROMAX) 500 mg in sodium chloride 0.9 % 250 mL IVPB (500 mg Intravenous New Bag/Given 01/27/23 1111)  HYDROmorphone (DILAUDID) injection 1 mg (1 mg Intravenous Given 01/27/23 1057)    Mobility walks

## 2023-01-27 NOTE — ED Provider Notes (Signed)
[place] EMERGENCY DEPARTMENT AT [facility] Provider Note   CSN: [id number] Arrival date & time: 01/27/23  2595     History  Chief Complaint  Patient presents with   Sickle Cell Pain Crisis    [name] [name]. is a 42 y.o. male.  42 year old male with past medical history of sickle cell disease as well as DVT in the past presenting to the emergency department today with pain in his arms and legs.  The patient states this is similar to previous sickle cell pain crises.  The patient states he is also having some pain in his chest that is worse with deep breathing.  He reports a history of DVT a few months ago after a knee surgery.  He is no longer on anticoagulation.  The patient states that he does have chest pain from time to time when his sickle cell flares.  He denies any hemoptysis.  Denies any leg pain or swelling.  He came to the ER today due to these symptoms after his home medications were not working.   Sickle Cell Pain Crisis Associated symptoms: chest pain        Home Medications Prior to Admission medications   Medication Sig Start Date End Date Taking? Authorizing Provider  amitriptyline (ELAVIL) 25 MG tablet Take 2 tablets (50 mg total) by mouth at bedtime. Patient taking differently: Take 75 mg by mouth at bedtime. 01/25/23  Yes Ivonne Janifer Gieselman, NP  calcitRIOL (ROCALTROL) 0.25 MCG capsule Take 0.25 mcg by mouth daily with breakfast. 11/16/20  Yes [provider]  Deferiprone, Twice Daily, (FERRIPROX TWICE-A-DAY) 1000 MG TABS Take 3,000 mg by mouth daily. 05/01/22  Yes Massie Maroon, FNP  folic acid (FOLVITE) 1 MG tablet TAKE 1 TABLET BY MOUTH EVERY DAY Patient taking differently: Take 1 mg by mouth every morning. 03/22/20  Yes Massie Maroon, FNP  hydroxyurea (HYDREA) 500 MG capsule Take 1 capsule by mouth in the morning on Sun/Tues/Wed/Thurs/Sat and 2 capsules on Mon/Fri 01/25/23  Yes Ivonne Paola Flynt, NP  oxyCODONE (OXYCONTIN) 15  mg 12 hr tablet Take 1 tablet (15 mg) by mouth every 12 hours. 01/25/23  Yes Ivonne Minerva Bluett, NP  Oxycodone HCl 10 MG TABS Take 1 tablet (10 mg total) by mouth See admin instructions. Take 10 mg by mouth every 6 hours Patient taking differently: Take 10 mg by mouth every 4 (four) hours as needed (pain). 01/18/23  Yes Massie Maroon, FNP  sodium bicarbonate 650 MG tablet Take 1,950 mg by mouth with breakfast, with lunch, and with evening meal. 11/07/18  Yes [provider]  TYLENOL 8 HOUR ARTHRITIS PAIN 650 MG CR tablet Take 650 mg by mouth every 8 (eight) hours as needed for pain.   Yes [provider]  VELTASSA 16.8 g PACK Take 16.8 g by mouth daily. 10/10/21  Yes [provider]  apixaban (ELIQUIS) 5 MG TABS tablet Take 5 mg by mouth 2 (two) times daily. Patient not taking: Reported on 01/25/2023 11/01/22 02/05/23  [provider]  methylPREDNISolone (MEDROL DOSEPAK) 4 MG TBPK tablet Take by mouth as directed. Patient not taking: Reported on 01/27/2023 01/19/23   [provider]      Allergies    Nsaids and Morphine and codeine    Review of Systems   Review of Systems  Cardiovascular:  Positive for chest pain.  Musculoskeletal:  Positive for arthralgias.  All other systems reviewed and are negative.   Physical Exam  Updated Vital Signs BP (!) 116/91   Pulse (!) 102   Temp 98.3 F (36.8 C)   Resp 17   Ht 5\' 3"  (1.6 m)   Wt 57.2 kg   SpO2 98%   BMI 22.32 kg/m  Physical Exam Vitals and nursing note reviewed.   Gen: NAD Eyes: PERRL, EOMI HEENT: no oropharyngeal swelling Neck: trachea midline Resp: clear to auscultation bilaterally Card: Tachycardic, no murmurs, rubs, or gallops Abd: nontender, nondistended Extremities: no calf tenderness, no edema Vascular: 2+ radial pulses bilaterally, 2+ DP pulses bilaterally Skin: no rashes Psyc: acting appropriately   ED Results / Procedures / Treatments   Labs (all labs ordered are  listed, but only abnormal results are displayed) Labs Reviewed  CBC WITH DIFFERENTIAL/PLATELET - Abnormal; Notable for the following components:      Result Value   WBC 17.3 (*)    RBC 2.08 (*)    Hemoglobin 8.0 (*)    HCT 23.0 (*)    MCV 110.6 (*)    MCH 38.5 (*)    nRBC 0.3 (*)    Neutro Abs 8.7 (*)    Lymphs Abs 6.3 (*)    Monocytes Absolute 2.0 (*)    Abs Immature Granulocytes 0.12 (*)    All other components within normal limits  BASIC METABOLIC PANEL - Abnormal; Notable for the following components:   BUN 51 (*)    Creatinine, Ser 2.62 (*)    Calcium 8.6 (*)    GFR, Estimated 31 (*)    All other components within normal limits  RETICULOCYTES - Abnormal; Notable for the following components:   Retic Ct Pct 7.6 (*)    RBC. 2.12 (*)    Immature Retic Fract 36.6 (*)    All other components within normal limits  D-DIMER, QUANTITATIVE - Abnormal; Notable for the following components:   D-Dimer, Quant 2.74 (*)    All other components within normal limits  CULTURE, BLOOD (ROUTINE X 2)  CULTURE, BLOOD (ROUTINE X 2)  TROPONIN I (HIGH SENSITIVITY)  TROPONIN I (HIGH SENSITIVITY)    EKG EKG Interpretation Date/Time:  Saturday January 27 2023 07:46:16 EST Ventricular Rate:  101 PR Interval:  157 QRS Duration:  79 QT Interval:  314 QTC Calculation: 407 R Axis:   38  Text Interpretation: Sinus tachycardia Confirmed by Beckey Downing [phone]) on 01/27/2023 7:53:38 AM  Radiology DG Chest Portable 1 View Result Date: 01/27/2023 CLINICAL DATA:  42 year old male with history of sickle cell disease presenting with sickle cell crisis. EXAM: PORTABLE CHEST 1 VIEW COMPARISON:  Chest x-ray 04/30/2022. FINDINGS: Poorly defined opacity in the periphery of the left lung base. Widespread interstitial prominence and peribronchial cuffing, similar to prior studies, apparently chronic. No pleural effusions. No pneumothorax. No evidence of pulmonary edema. Heart size appears borderline enlarged.  Upper mediastinal contours are within normal limits allowing for patient positioning. IMPRESSION: 1. New ill-defined opacity in the periphery of the left lung base which could reflect acute chest syndrome. Electronically Signed   By: Trudie Reed M.D.   On: 01/27/2023 09:05    Procedures Procedures    Medications Ordered in ED Medications  HYDROmorphone (DILAUDID) injection 1 mg (has no administration in time range)  cefTRIAXone (ROCEPHIN) 1 g in sodium chloride 0.9 % 100 mL IVPB (has no administration in time range)  azithromycin (ZITHROMAX) 500 mg in sodium chloride 0.9 % 250 mL IVPB (has no administration in time range)  HYDROmorphone (DILAUDID) injection 1 mg (1 mg Intravenous Given 01/27/23  5784)  ondansetron [facility]) injection 4 mg (4 mg Intravenous Given 01/27/23 0803)  sodium chloride 0.9 % bolus 1,000 mL (1,000 mLs Intravenous New Bag/Given 01/27/23 0805)    ED Course/ Medical Decision Making/ A&P                                 Medical Decision Making 42 year old male with past medical history of sickle cell disease and DVT in the past presenting to the emergency department today with pain in his arms and legs consistent with previous sickle cell pain crises.  Patient is also complaining of chest pain.  Will further evaluate the patient here with basic labs Wels and EKG, chest x-ray, and troponin to evaluate for ACS, pulmonary edema, pulmonary infiltrates, or pneumothorax.  This also evaluate for acute chest syndrome.  Will obtain a D-dimer given the patient's history of DVT to evaluate for pulmonary embolism.  I give the patient Dilaudid and Zofran for symptom as well as IV fluids.  Will reevaluate for ultimate disposition.  The patient's x-ray does show a possible small infiltrate.  His D-dimer is elevated.  A VQ scan is ordered.  I did call and discussed this case with hematology.  They did not have any further recommendations other than admission for IV antibiotics.  The  patient is not requiring any oxygen.  Calls placed to hospitalist service for admission.    Amount and/or Complexity of Data Reviewed Labs: ordered. Radiology: ordered.  Risk Prescription drug management. Decision regarding hospitalization.           Final Clinical Impression(s) / ED Diagnoses Final diagnoses:  Acute chest syndrome [facility])    Rx / DC Orders ED Discharge Orders     None         Durwin Glaze, MD 01/27/23 [phone]

## 2023-01-27 NOTE — Progress Notes (Signed)
The patient verbalized understanding of the risks and benefits of not taking Eliquis, including an increased risk of blood clots, stroke, or other complications associated with their condition. They also acknowledged the potential benefits of avoiding bleeding risks associated with anticoagulant therapy. Despite this understanding, the patient has chosen to decline Eliquis at this time. The provider on call notified

## 2023-01-27 NOTE — ED Triage Notes (Signed)
Patient c/o sickle cell pain Flare started yesterday, worsened overnight Whole body, legs worse Pain rated 15/10 Home meds not working

## 2023-01-28 DIAGNOSIS — D5700 Hb-SS disease with crisis, unspecified: Secondary | ICD-10-CM | POA: Diagnosis not present

## 2023-01-28 LAB — CBC WITH DIFFERENTIAL/PLATELET
Abs Immature Granulocytes: 0.15 10*3/uL — ABNORMAL HIGH (ref 0.00–0.07)
Basophils Absolute: 0 10*3/uL (ref 0.0–0.1)
Basophils Relative: 0 %
Eosinophils Absolute: 0.2 10*3/uL (ref 0.0–0.5)
Eosinophils Relative: 1 %
HCT: 22.4 % — ABNORMAL LOW (ref 39.0–52.0)
Hemoglobin: 7.5 g/dL — ABNORMAL LOW (ref 13.0–17.0)
Immature Granulocytes: 1 %
Lymphocytes Relative: 55 %
Lymphs Abs: 9.1 10*3/uL — ABNORMAL HIGH (ref 0.7–4.0)
MCH: 38.1 pg — ABNORMAL HIGH (ref 26.0–34.0)
MCHC: 33.5 g/dL (ref 30.0–36.0)
MCV: 113.7 fL — ABNORMAL HIGH (ref 80.0–100.0)
Monocytes Absolute: 1.6 10*3/uL — ABNORMAL HIGH (ref 0.1–1.0)
Monocytes Relative: 10 %
Neutro Abs: 5.4 10*3/uL (ref 1.7–7.7)
Neutrophils Relative %: 33 %
Platelets: 245 10*3/uL (ref 150–400)
RBC: 1.97 MIL/uL — ABNORMAL LOW (ref 4.22–5.81)
WBC: 16.4 10*3/uL — ABNORMAL HIGH (ref 4.0–10.5)
nRBC: 0.3 % — ABNORMAL HIGH (ref 0.0–0.2)

## 2023-01-28 LAB — COMPREHENSIVE METABOLIC PANEL
ALT: 37 U/L (ref 0–44)
AST: 31 U/L (ref 15–41)
Albumin: 2.8 g/dL — ABNORMAL LOW (ref 3.5–5.0)
Alkaline Phosphatase: 119 U/L (ref 38–126)
Anion gap: 6 (ref 5–15)
BUN: 35 mg/dL — ABNORMAL HIGH (ref 6–20)
CO2: 23 mmol/L (ref 22–32)
Calcium: 8.5 mg/dL — ABNORMAL LOW (ref 8.9–10.3)
Chloride: 107 mmol/L (ref 98–111)
Creatinine, Ser: 2.02 mg/dL — ABNORMAL HIGH (ref 0.61–1.24)
GFR, Estimated: 42 mL/min — ABNORMAL LOW (ref >60)
Glucose, Bld: 90 mg/dL (ref 70–99)
Potassium: 5.2 mmol/L — ABNORMAL HIGH (ref 3.5–5.1)
Sodium: 136 mmol/L (ref 135–145)
Total Bilirubin: 1.3 mg/dL — ABNORMAL HIGH (ref <1.2)
Total Protein: 6 g/dL — ABNORMAL LOW (ref 6.5–8.1)

## 2023-01-28 MED ORDER — SODIUM ZIRCONIUM CYCLOSILICATE 5 G PO PACK
5.0000 g | PACK | Freq: Every day | ORAL | Status: DC
  Administered 2023-01-29 – 2023-01-30 (×2): 5 g via ORAL
  Filled 2023-01-28 (×3): qty 1

## 2023-01-28 NOTE — Plan of Care (Signed)
  Problem: Education: Goal: Knowledge of General Education information will improve Description: Including pain rating scale, medication(s)/side effects and non-pharmacologic comfort measures Outcome: Progressing   Problem: Health Behavior/Discharge Planning: Goal: Ability to manage health-related needs will improve Outcome: Progressing   Problem: Clinical Measurements: Goal: Ability to maintain clinical measurements within normal limits will improve Outcome: Progressing Goal: Will remain free from infection Outcome: Progressing Goal: Diagnostic test results will improve Outcome: Progressing Goal: Respiratory complications will improve Outcome: Progressing Goal: Cardiovascular complication will be avoided Outcome: Progressing   Problem: Nutrition: Goal: Adequate nutrition will be maintained Outcome: Progressing   Problem: Coping: Goal: Level of anxiety will decrease Outcome: Progressing   Problem: Elimination: Goal: Will not experience complications related to bowel motility Outcome: Progressing Goal: Will not experience complications related to urinary retention Outcome: Progressing   Problem: Pain Management: Goal: General experience of comfort will improve Outcome: Progressing   Problem: Education: Goal: Knowledge of vaso-occlusive preventative measures will improve Outcome: Progressing Goal: Awareness of infection prevention will improve Outcome: Progressing Goal: Awareness of signs and symptoms of anemia will improve Outcome: Progressing Goal: Long-term complications will improve Outcome: Progressing   Problem: Self-Care: Goal: Ability to incorporate actions that prevent/reduce pain crisis will improve Outcome: Progressing   Problem: Bowel/Gastric: Goal: Gut motility will be maintained Outcome: Progressing   Problem: Respiratory: Goal: Pulmonary complications will be avoided or minimized Outcome: Progressing Goal: Acute Chest Syndrome will be  identified early to prevent complications Outcome: Progressing   Problem: Tissue Perfusion: Goal: Complications related to inadequate tissue perfusion will be avoided or minimized Outcome: Progressing   Problem: Fluid Volume: Goal: Ability to maintain a balanced intake and output will improve Outcome: Progressing

## 2023-01-28 NOTE — Progress Notes (Signed)
SICKLE CELL SERVICE PROGRESS NOTE  [name] [name]. FIE:[id number] DOB: [birth year] DOA: 01/27/2023 PCP: [name] Andrew, NP  Assessment/Plan: Principal Problem:   Acute sickle cell crisis (HCC) Active Problems:   Hyperkalemia   CKD (chronic kidney disease), stage IV (HCC)   TOBACCO ABUSE   Polysubstance abuse (HCC)   Sickle cell disease with crisis (HCC)  Acute sickle cell crisis: Patient on Dilaudid PCA, Toradol, IV fluids.  Continue to titrate pain medications Possible acute chest syndrome versus PE versus pneumonia: Back on his anticoagulation.  Awaiting VQ scan.  Patient complained of left leg pain especially around the calf region.  I will get ultrasound to rule out DVT. Hyperkalemia: Lokelma ordered.  Patient was refusing it but now will take it.  He was on potassium supplementation that we will hold Anemia of chronic disease: H&H remained stable.  Continue to monitor Chronic kidney disease stage IV: Renal function is stable.  Continue to monitor Tobacco abuse: Tobacco cessation counseling with nicotine patch  Code Status: Full Family Communication: No family at bedside Disposition Plan: Home when ready  [facility]  Pager [phone]. If 7PM-7AM, please contact night-coverage.  01/28/2023, 9:16 AM  LOS: 1 day   Brief narrative: [name] [name]. is a 42 y.o. male with medical history significant of sickle cell disease, chronic kidney disease stage IIIb, GERD, depression, chronic pain syndrome, who was seen in the ER today complaining of chest pain shortness of breath and generalized pain consistent with his typical sickle cell crisis.  Patient has been dealing with this for the last few days.  He was in the ER twice.  In the ER patient was evaluated.  Labs and numbers seem to be close to baseline however his chest x-ray is suggested possible segmental consolidation.  Worrisome about pneumonia versus PE.  Also possible acute chest syndrome.  He is therefore being  admitted to the hospital for further evaluation and treatment.   Consultants: None  Procedures: Chest x-ray   Antibiotics: None  HPI/Subjective: Patient's pain is still a 7 out of 10.  Still awaiting VQ scan.  Potassium is noted to be 5.2.  Hemoglobin is stable.  Creatinine also stable  Objective: Vitals:   01/27/23 2334 01/28/23 0348 01/28/23 0523 01/28/23 0821  BP: 108/79  98/64   Pulse: (!) 103  100   Resp: 18 14 18 16   Temp: 97.8 F (36.6 C)  98.6 F (37 C)   TempSrc: Oral  Oral   SpO2: 99% 98% 94%   Weight:      Height:       Weight change:   Intake/Output Summary (Last 24 hours) at 01/28/2023 0916 Last data filed at 01/28/2023 0700 Gross per 24 hour  Intake 1590 ml  Output 1300 ml  Net 290 ml    General: Alert, awake, oriented x3, in no acute distress.  HEENT: [place]/AT PEERL, EOMI Neck: Trachea midline,  no masses, no thyromegal,y no JVD, no carotid bruit OROPHARYNX:  Moist, No exudate/ erythema/lesions.  Heart: Regular rate and rhythm, without murmurs, rubs, gallops, PMI non-displaced, no heaves or thrills on palpation.  Lungs: Clear to auscultation, no wheezing or rhonchi noted. No increased vocal fremitus resonant to percussion  Abdomen: Soft, nontender, nondistended, positive bowel sounds, no masses no hepatosplenomegaly noted..  Neuro: No focal neurological deficits noted cranial nerves II through XII grossly intact. DTRs 2+ bilaterally upper and lower extremities. Strength 5 out of 5 in bilateral upper and lower extremities. Musculoskeletal: No warm swelling  or erythema around joints, no spinal tenderness noted. Psychiatric: Patient alert and oriented x3, good insight and cognition, good recent to remote recall. Lymph node survey: No cervical axillary or inguinal lymphadenopathy noted.   Data Reviewed: Basic Metabolic Panel: Recent Labs  Lab 01/25/23 1108 01/27/23 0807 01/28/23 0702  NA 138 139 136  K 4.8 4.6 5.2*  CL 101 104 107  CO2 22 27 23    GLUCOSE 112* 98 90  BUN 44* 51* 35*  CREATININE 2.89* 2.62* 2.02*  CALCIUM 9.2 8.6* 8.5*   Liver Function Tests: Recent Labs  Lab 01/25/23 1108 01/28/23 0702  AST 46* 31  ALT 41 37  ALKPHOS 184* 119  BILITOT 0.9 1.3*  PROT 6.7 6.0*  ALBUMIN 4.0* 2.8*   No results for input(s): "LIPASE", "AMYLASE" in the last 168 hours. No results for input(s): "AMMONIA" in the last 168 hours. CBC: Recent Labs  Lab 01/25/23 1108 01/27/23 0807 01/28/23 0702  WBC 16.5* 17.3* 16.4*  NEUTROABS 10.2* 8.7* 5.4  HGB 8.8* 8.0* 7.5*  HCT 26.5* 23.0* 22.4*  MCV 111* 110.6* 113.7*  PLT 381 265 245   Cardiac Enzymes: No results for input(s): "CKTOTAL", "CKMB", "CKMBINDEX", "TROPONINI" in the last 168 hours. BNP (last 3 results) No results for input(s): "BNP" in the last 8760 hours.  ProBNP (last 3 results) No results for input(s): "PROBNP" in the last 8760 hours.  CBG: No results for input(s): "GLUCAP" in the last 168 hours.  No results found for this or any previous visit (from the past 240 hours).   Studies: DG Chest Portable 1 View Result Date: 01/27/2023 CLINICAL DATA:  42 year old male with history of sickle cell disease presenting with sickle cell crisis. EXAM: PORTABLE CHEST 1 VIEW COMPARISON:  Chest x-ray 04/30/2022. FINDINGS: Poorly defined opacity in the periphery of the left lung base. Widespread interstitial prominence and peribronchial cuffing, similar to prior studies, apparently chronic. No pleural effusions. No pneumothorax. No evidence of pulmonary edema. Heart size appears borderline enlarged. Upper mediastinal contours are within normal limits allowing for patient positioning. IMPRESSION: 1. New ill-defined opacity in the periphery of the left lung base which could reflect acute chest syndrome. Electronically Signed   By: Trudie Reed M.D.   On: 01/27/2023 09:05    Scheduled Meds:  amitriptyline  75 mg Oral QHS   apixaban  5 mg Oral BID   calcitRIOL  0.25 mcg Oral Q  breakfast   folic acid  1 mg Oral q morning   HYDROmorphone   Intravenous Q4H   hydroxyurea  500 mg Oral Once per day on Sunday Tuesday Wednesday Thursday Saturday   And   [START ON 01/29/2023] hydroxyurea  1,000 mg Oral Once per day on Monday Friday   oxyCODONE  15 mg Oral Q12H   senna-docusate  1 tablet Oral BID   sodium bicarbonate  1,950 mg Oral TID with meals   sodium zirconium cyclosilicate  5 g Oral Daily   Continuous Infusions:  sodium chloride 100 mL/hr at 01/28/23 0107    Principal Problem:   Acute sickle cell crisis (HCC) Active Problems:   Hyperkalemia   CKD (chronic kidney disease), stage IV (HCC)   TOBACCO ABUSE   Polysubstance abuse (HCC)   Sickle cell disease with crisis (HCC)

## 2023-01-29 ENCOUNTER — Inpatient Hospital Stay (HOSPITAL_COMMUNITY): Payer: Medicare Other

## 2023-01-29 ENCOUNTER — Other Ambulatory Visit: Payer: Self-pay

## 2023-01-29 ENCOUNTER — Encounter (HOSPITAL_COMMUNITY): Payer: Self-pay | Admitting: Internal Medicine

## 2023-01-29 DIAGNOSIS — M7989 Other specified soft tissue disorders: Secondary | ICD-10-CM

## 2023-01-29 LAB — BASIC METABOLIC PANEL
Anion gap: 10 (ref 5–15)
BUN: 47 mg/dL — ABNORMAL HIGH (ref 6–20)
CO2: 19 mmol/L — ABNORMAL LOW (ref 22–32)
Calcium: 8.9 mg/dL (ref 8.9–10.3)
Chloride: 112 mmol/L — ABNORMAL HIGH (ref 98–111)
Creatinine, Ser: 2.61 mg/dL — ABNORMAL HIGH (ref 0.61–1.24)
GFR, Estimated: 31 mL/min — ABNORMAL LOW (ref >60)
Glucose, Bld: 85 mg/dL (ref 70–99)
Potassium: 6.2 mmol/L — ABNORMAL HIGH (ref 3.5–5.1)
Sodium: 141 mmol/L (ref 135–145)

## 2023-01-29 LAB — CBC WITH DIFFERENTIAL/PLATELET
Abs Immature Granulocytes: 0.08 10*3/uL — ABNORMAL HIGH (ref 0.00–0.07)
Basophils Absolute: 0 10*3/uL (ref 0.0–0.1)
Basophils Relative: 0 %
Eosinophils Absolute: 0.4 10*3/uL (ref 0.0–0.5)
Eosinophils Relative: 2 %
HCT: 25.7 % — ABNORMAL LOW (ref 39.0–52.0)
Hemoglobin: 8 g/dL — ABNORMAL LOW (ref 13.0–17.0)
Immature Granulocytes: 1 %
Lymphocytes Relative: 46 %
Lymphs Abs: 7.1 10*3/uL — ABNORMAL HIGH (ref 0.7–4.0)
MCH: 37.4 pg — ABNORMAL HIGH (ref 26.0–34.0)
MCHC: 31.1 g/dL (ref 30.0–36.0)
MCV: 120.1 fL — ABNORMAL HIGH (ref 80.0–100.0)
Monocytes Absolute: 1.6 10*3/uL — ABNORMAL HIGH (ref 0.1–1.0)
Monocytes Relative: 11 %
Neutro Abs: 6 10*3/uL (ref 1.7–7.7)
Neutrophils Relative %: 40 %
Platelets: 222 10*3/uL (ref 150–400)
RBC: 2.14 MIL/uL — ABNORMAL LOW (ref 4.22–5.81)
WBC: 15.1 10*3/uL — ABNORMAL HIGH (ref 4.0–10.5)
nRBC: 0.3 % — ABNORMAL HIGH (ref 0.0–0.2)

## 2023-01-29 MED ORDER — TECHNETIUM TO 99M ALBUMIN AGGREGATED
4.4000 | Freq: Once | INTRAVENOUS | Status: AC
  Administered 2023-01-29: 4.4 via INTRAVENOUS

## 2023-01-29 MED ORDER — SODIUM CHLORIDE 0.45 % IV SOLN: INTRAVENOUS | Status: AC

## 2023-01-29 NOTE — Plan of Care (Signed)
  Problem: Education: Goal: Knowledge of General Education information will improve Description: Including pain rating scale, medication(s)/side effects and non-pharmacologic comfort measures Outcome: Progressing   Problem: Clinical Measurements: Goal: Ability to maintain clinical measurements within normal limits will improve Outcome: Progressing Goal: Will remain free from infection Outcome: Progressing   Problem: Activity: Goal: Risk for activity intolerance will decrease Outcome: Progressing   Problem: Nutrition: Goal: Adequate nutrition will be maintained Outcome: Progressing   Problem: Coping: Goal: Level of anxiety will decrease Outcome: Progressing   Problem: Elimination: Goal: Will not experience complications related to urinary retention Outcome: Progressing   Problem: Safety: Goal: Ability to remain free from injury will improve Outcome: Progressing

## 2023-01-29 NOTE — Plan of Care (Signed)
  Problem: Education: Goal: Knowledge of General Education information will improve Description: Including pain rating scale, medication(s)/side effects and non-pharmacologic comfort measures Outcome: Progressing   Problem: Health Behavior/Discharge Planning: Goal: Ability to manage health-related needs will improve Outcome: Progressing   Problem: Clinical Measurements: Goal: Ability to maintain clinical measurements within normal limits will improve Outcome: Progressing Goal: Will remain free from infection Outcome: Progressing Goal: Diagnostic test results will improve Outcome: Progressing Goal: Respiratory complications will improve Outcome: Progressing Goal: Cardiovascular complication will be avoided Outcome: Progressing   Problem: Activity: Goal: Risk for activity intolerance will decrease Outcome: Progressing   Problem: Nutrition: Goal: Adequate nutrition will be maintained Outcome: Progressing   Problem: Coping: Goal: Level of anxiety will decrease Outcome: Progressing   Problem: Elimination: Goal: Will not experience complications related to bowel motility Outcome: Progressing Goal: Will not experience complications related to urinary retention Outcome: Progressing   Problem: Pain Management: Goal: General experience of comfort will improve Outcome: Progressing   Problem: Safety: Goal: Ability to remain free from injury will improve Outcome: Progressing   Problem: Skin Integrity: Goal: Risk for impaired skin integrity will decrease Outcome: Progressing   Problem: Education: Goal: Knowledge of vaso-occlusive preventative measures will improve Outcome: Progressing Goal: Awareness of infection prevention will improve Outcome: Progressing Goal: Awareness of signs and symptoms of anemia will improve Outcome: Progressing Goal: Long-term complications will improve Outcome: Progressing   Problem: Self-Care: Goal: Ability to incorporate actions that  prevent/reduce pain crisis will improve Outcome: Progressing   Problem: Bowel/Gastric: Goal: Gut motility will be maintained Outcome: Progressing   Problem: Tissue Perfusion: Goal: Complications related to inadequate tissue perfusion will be avoided or minimized Outcome: Progressing   Problem: Fluid Volume: Goal: Ability to maintain a balanced intake and output will improve Outcome: Progressing   Problem: Sensory: Goal: Pain level will decrease with appropriate interventions Outcome: Progressing   Problem: Health Behavior: Goal: Postive changes in compliance with treatment and prescription regimens will improve Outcome: Progressing   Problem: Education: Goal: Knowledge of vaso-occlusive preventative measures will improve Outcome: Progressing Goal: Awareness of infection prevention will improve Outcome: Progressing Goal: Awareness of signs and symptoms of anemia will improve Outcome: Progressing Goal: Long-term complications will improve Outcome: Progressing   Problem: Respiratory: Goal: Pulmonary complications will be avoided or minimized Outcome: Progressing Goal: Acute Chest Syndrome will be identified early to prevent complications Outcome: Progressing

## 2023-01-29 NOTE — Progress Notes (Signed)
Left lower extremity venous duplex has been completed. Preliminary results can be found in CV Proc through chart review.   01/29/23 1:51 PM Olen Cordial RVT

## 2023-01-30 DIAGNOSIS — D5700 Hb-SS disease with crisis, unspecified: Secondary | ICD-10-CM | POA: Diagnosis not present

## 2023-01-30 LAB — CBC
HCT: 23.2 % — ABNORMAL LOW (ref 39.0–52.0)
Hemoglobin: 7.6 g/dL — ABNORMAL LOW (ref 13.0–17.0)
MCH: 37.6 pg — ABNORMAL HIGH (ref 26.0–34.0)
MCHC: 32.8 g/dL (ref 30.0–36.0)
MCV: 114.9 fL — ABNORMAL HIGH (ref 80.0–100.0)
Platelets: 190 10*3/uL (ref 150–400)
RBC: 2.02 MIL/uL — ABNORMAL LOW (ref 4.22–5.81)
WBC: 15.6 10*3/uL — ABNORMAL HIGH (ref 4.0–10.5)
nRBC: 0.3 % — ABNORMAL HIGH (ref 0.0–0.2)

## 2023-01-30 LAB — BASIC METABOLIC PANEL
Anion gap: 7 (ref 5–15)
BUN: 51 mg/dL — ABNORMAL HIGH (ref 6–20)
CO2: 24 mmol/L (ref 22–32)
Calcium: 8.9 mg/dL (ref 8.9–10.3)
Chloride: 109 mmol/L (ref 98–111)
Creatinine, Ser: 2.76 mg/dL — ABNORMAL HIGH (ref 0.61–1.24)
GFR, Estimated: 29 mL/min — ABNORMAL LOW (ref >60)
Glucose, Bld: 102 mg/dL — ABNORMAL HIGH (ref 70–99)
Potassium: 5.9 mmol/L — ABNORMAL HIGH (ref 3.5–5.1)
Sodium: 140 mmol/L (ref 135–145)

## 2023-01-30 MED ORDER — SODIUM ZIRCONIUM CYCLOSILICATE 10 G PO PACK
10.0000 g | PACK | Freq: Every day | ORAL | Status: DC
Start: 2023-01-31 — End: 2023-01-30

## 2023-01-30 MED ORDER — SODIUM ZIRCONIUM CYCLOSILICATE 5 G PO PACK
5.0000 g | PACK | ORAL | Status: AC
  Administered 2023-01-30: 5 g via ORAL
  Filled 2023-01-30: qty 1

## 2023-01-30 NOTE — Discharge Summary (Signed)
Physician Discharge Summary  [name] [name]. WUJ:[id number] DOB: September 08, 1980 DOA: 01/27/2023  PCP: Ivonne Andrew, NP  Admit date: 01/27/2023  Discharge date: 01/31/2023  Discharge Diagnoses:  Principal Problem:   Acute sickle cell crisis (HCC) Active Problems:   TOBACCO ABUSE   CKD (chronic kidney disease), stage IV (HCC)   Hyperkalemia   Polysubstance abuse (HCC)   Sickle cell disease with crisis [facility])   Discharge Condition: Stable  Disposition:   Follow-up Information     Ivonne Andrew, NP Follow up in 1 week(s).   Specialties: Pulmonary Disease, Endocrinology Contact information: [address] [place] [address] [phone]                Pt is discharged home in good condition and is to follow up with Ivonne Andrew, NP this week to have labs evaluated. [name] A [name] Jr. is instructed to increase activity slowly and balance with rest for the next few days, and use prescribed medication to complete treatment of pain  Diet: Regular Wt Readings from Last 3 Encounters:  01/27/23 57.2 kg  01/25/23 57 kg  12/31/22 50.8 kg    History of present illness:  [name] [name]. with a medical history significant for sickle cell disease, chronic kidney disease stage IIIb, GERD, depression, chronic pain syndrome, who was seen in the ER complaining of chest pain, shortness of breath, and generalized pain consistent with his typical sickle cell crisis.  Patient has been dealing with this over the past several days.  He was in the ER twice.  In the ER, patient was evaluated.  Labs seem to be close to baseline, however his chest x-ray is suggestive of possible segmental consolidation.  Worrisome for pneumonia versus PE.  Also, possible acute chest syndrome.  Patient therefore admitted to the hospital for further evaluation and treatment.  Hospital Course:  Sickle cell disease with pain crisis: Patient was admitted for sickle cell pain crisis  and managed appropriately with IVF, IV Dilaudid via PCA, as well as other adjunct therapies per sickle cell pain management protocols patient transition to his home pain medication regimen of OxyContin and oxycodone.  Patient will continue to follow-up with his PCP for medication management. Patient states that his pain intensity is 5/10 and he is requesting discharge home today. On admission, chest x-ray showed possible consolidation versus PE, VQ scan showed no pulmonary embolism.  Marland Kitchen Anemia of chronic disease: Patient's hemoglobin remained stable and consistent with his baseline.  He will continue to follow-up with [facility].  Chronic kidney disease stage IIIb: Prior to discharge, patient's serum creatinine 2.76, improved from his baseline.  Patient will continue to follow-up with [facility].  Hyperkalemia: Potassium improved throughout admission.  Chronically elevated.  Patient will resume Lokelma, which is managed by his nephrology team at [facility]. Patient was therefore discharged home today in a hemodynamically stable condition.   [name] will follow-up with PCP within 1 week of this discharge. [name] was counseled extensively about nonpharmacologic means of pain management, patient verbalized understanding and was appreciative of  the care received during this admission.   We discussed the need for good hydration, monitoring of hydration status, avoidance of heat, cold, stress, and infection triggers. We discussed the need to be adherent with taking Hydrea and other home medications. Patient was reminded of the need to seek medical attention immediately if any symptom of bleeding, anemia, or infection occurs.  Discharge Exam: Vitals:   01/30/23 1026 01/30/23  1155  BP: 112/72   Pulse: 100   Resp: 14 14  Temp:    SpO2: 97%    Vitals:   01/30/23 0523 01/30/23 0738 01/30/23 1026 01/30/23 1155  BP: 114/84  112/72   Pulse: 92  100   Resp: 16 16 14 14   Temp: 98 F (36.7 C)      TempSrc: Oral     SpO2: 96% 96% 97%   Weight:      Height:        General appearance : Awake, alert, not in any distress. Speech Clear. Not toxic looking HEENT: Atraumatic and Normocephalic, pupils equally reactive to light and accomodation Neck: Supple, no JVD. No cervical lymphadenopathy.  Chest: Good air entry bilaterally, no added sounds  CVS: S1 S2 regular, no murmurs.  Abdomen: Bowel sounds present, Non tender and not distended with no gaurding, rigidity or rebound. Extremities: B/L Lower Ext shows no edema, both legs are warm to touch Neurology: Awake alert, and oriented X 3, CN II-XII intact, Non focal Skin: No Rash  Discharge Instructions  Discharge Instructions     Discharge patient   Complete by: As directed    Discharge disposition: 01-Home or Self Care   Discharge patient date: 01/30/2023      Allergies as of 01/30/2023       Reactions   Nsaids Other (See Comments)   Kidney disease   Morphine And Codeine Other (See Comments)   "Really bad headaches"         Medication List     TAKE these medications    amitriptyline 25 MG tablet Commonly known as: ELAVIL Take 2 tablets (50 mg total) by mouth at bedtime. What changed: how much to take   apixaban 5 MG Tabs tablet Commonly known as: ELIQUIS Take 5 mg by mouth 2 (two) times daily.   calcitRIOL 0.25 MCG capsule Commonly known as: ROCALTROL Take 0.25 mcg by mouth daily with breakfast.   Ferriprox Twice-A-Day 1000 MG Tabs Generic drug: Deferiprone (Twice Daily) Take 3,000 mg by mouth daily.   folic acid 1 MG tablet Commonly known as: FOLVITE TAKE 1 TABLET BY MOUTH EVERY DAY What changed: when to take this   hydroxyurea 500 MG capsule Commonly known as: HYDREA Take 1 capsule by mouth in the morning on Sun/Tues/Wed/Thurs/Sat and 2 capsules on Mon/Fri   methylPREDNISolone 4 MG Tbpk tablet Commonly known as: MEDROL DOSEPAK Take by mouth as directed.   Oxycodone HCl 10 MG Tabs Take 1 tablet  (10 mg total) by mouth See admin instructions. Take 10 mg by mouth every 6 hours What changed:  when to take this reasons to take this additional instructions   oxyCODONE 15 mg 12 hr tablet Commonly known as: OXYCONTIN Take 1 tablet (15 mg) by mouth every 12 hours. What changed: Another medication with the same name was changed. Make sure you understand how and when to take each.   sodium bicarbonate 650 MG tablet Take 1,950 mg by mouth with breakfast, with lunch, and with evening meal.   Tylenol 8 Hour Arthritis Pain 650 MG CR tablet Generic drug: acetaminophen Take 650 mg by mouth every 8 (eight) hours as needed for pain.   Veltassa 16.8 g Pack Generic drug: Patiromer Sorbitex Calcium Take 16.8 g by mouth daily.        The results of significant diagnostics from this hospitalization (including imaging, microbiology, ancillary and laboratory) are listed below for reference.    Significant Diagnostic Studies: VAS [place] LOWER  EXTREMITY VENOUS (DVT) Result Date: 01/29/2023  Lower Venous DVT Study Patient Name:  [name] [name].  Date of Exam:   01/29/2023 Medical Rec #: [id number]               Accession #:    [id number] Date of Birth: 1980-11-05              Patient Gender: M Patient Age:   42 years Exam Location:  [facility] Procedure:      VAS [place] LOWER EXTREMITY VENOUS (DVT) Referring Phys: Earlie Lou --------------------------------------------------------------------------------  Indications: Pain.  Risk Factors: None identified. Comparison Study: No prior studies. Performing Technologist: Chanda Busing RVT  Examination Guidelines: A complete evaluation includes B-mode imaging, spectral Doppler, color Doppler, and power Doppler as needed of all accessible portions of each vessel. Bilateral testing is considered an integral part of a complete examination. Limited examinations for reoccurring indications may be performed as noted. The reflux portion of the exam is  performed with the patient in reverse Trendelenburg.  +---------+---------------+---------+-----------+----------+--------------+ RIGHT    CompressibilityPhasicitySpontaneityPropertiesThrombus Aging +---------+---------------+---------+-----------+----------+--------------+ CFV      Full           Yes      Yes                                 +---------+---------------+---------+-----------+----------+--------------+ SFJ      Full                                                        +---------+---------------+---------+-----------+----------+--------------+ FV Prox  Full                                                        +---------+---------------+---------+-----------+----------+--------------+ FV Mid   Full                                                        +---------+---------------+---------+-----------+----------+--------------+ FV DistalFull                                                        +---------+---------------+---------+-----------+----------+--------------+ PFV      Full                                                        +---------+---------------+---------+-----------+----------+--------------+ POP      Full           Yes      Yes                                 +---------+---------------+---------+-----------+----------+--------------+  PTV      Full                                                        +---------+---------------+---------+-----------+----------+--------------+ PERO     Full                                                        +---------+---------------+---------+-----------+----------+--------------+   +----+---------------+---------+-----------+----------+--------------+ LEFTCompressibilityPhasicitySpontaneityPropertiesThrombus Aging +----+---------------+---------+-----------+----------+--------------+ CFV Full           Yes      Yes                                  +----+---------------+---------+-----------+----------+--------------+     Summary: RIGHT: - There is no evidence of deep vein thrombosis in the lower extremity.  - No cystic structure found in the popliteal fossa.  LEFT: - No evidence of common femoral vein obstruction.   *See table(s) above for measurements and observations. Electronically signed by Gerarda Fraction on 01/29/2023 at 6:50:13 PM.    Final    DG Chest 2 View Result Date: 01/29/2023 CLINICAL DATA:  Sickle-cell pain crisis.  Shortness of breath. EXAM: CHEST - 2 VIEW COMPARISON:  01/27/2023; 04/30/2022; chest CT-02/27/2017 FINDINGS: Unchanged cardiac silhouette and mediastinal contours. Redemonstrated mild diffuse slightly nodular thickening of the pulmonary interstitium. Redemonstrated right infrahilar and left basilar linear heterogeneous opacities favored to represent atelectasis or scarring. No new discrete focal airspace opacities. No pleural effusion or pneumothorax. No evidence of edema. 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 Postcholecystectomy. IMPRESSION: 1. Stable examination without superimposed acute cardiopulmonary disease. Specifically, no discrete focal airspace opacities to suggest pneumonia and/or acute chest syndrome. 2. Redemonstrated osseous findings compatible with history of sickle cell disease. Electronically Signed   By: Simonne Come M.D.   On: 01/29/2023 14:58   NM Pulmonary Perfusion Result Date: 01/29/2023 CLINICAL DATA:  Positive D-dimer. Concern for pulmonary embolism. Sickle cell crisis. EXAM: NUCLEAR MEDICINE PERFUSION LUNG SCAN TECHNIQUE: Perfusion images were obtained in multiple projections after intravenous injection of radiopharmaceutical. RADIOPHARMACEUTICALS:  4.4 mCi Tc-21m MAA COMPARISON:  Chest radiograph  01/27/2023, 01/29/2023 FINDINGS: Uniform perfusion to the LEFT and RIGHT lung. No wedge-shaped peripheral perfusion defects to suggest acute pulmonary embolism. IMPRESSION: Normal perfusion scan. Electronically Signed   By: Genevive Bi M.D.   On: 01/29/2023 14:17   DG Chest Portable 1 View Result Date: 01/27/2023 CLINICAL DATA:  42 year old male with history of sickle cell disease presenting with sickle cell crisis. EXAM: PORTABLE CHEST 1 VIEW COMPARISON:  Chest x-ray 04/30/2022. FINDINGS: Poorly defined opacity in the periphery of the left lung base. Widespread interstitial prominence and peribronchial cuffing, similar to prior studies, apparently chronic. No pleural effusions. No pneumothorax. No evidence of pulmonary edema. Heart size appears borderline enlarged. Upper mediastinal contours are within normal limits allowing for patient positioning. IMPRESSION: 1. New ill-defined opacity in the periphery of the left lung base which could reflect acute chest syndrome. Electronically Signed  By: Trudie Reed M.D.   On: 01/27/2023 09:05    Microbiology: Recent Results (from the past 240 hours)  Blood culture (routine x 2)     Status: None (Preliminary result)   Collection Time: 01/27/23 10:35 AM   Specimen: BLOOD  Result Value Ref Range Status   Specimen Description   Final    BLOOD RIGHT ANTECUBITAL Performed at [facility], [address], [place], [place] [address]    Special Requests   Final    BOTTLES DRAWN AEROBIC AND ANAEROBIC Blood Culture adequate volume Performed at [facility], [address], [place], [place] [address]    Culture   Final    NO GROWTH 3 DAYS Performed at [facility], [address], [place], [place] [address]    Report Status PENDING  Incomplete  Blood culture (routine x 2)     Status: None (Preliminary result)   Collection Time: 01/27/23 10:35 AM   Specimen: BLOOD  Result Value Ref Range Status    Specimen Description   Final    BLOOD LEFT ANTECUBITAL Performed at [name], [address], [place], [place] [address]    Special Requests   Final    BOTTLES DRAWN AEROBIC AND ANAEROBIC Blood Culture adequate volume Performed at [facility], [address], [place], [place] [address]    Culture   Final    NO GROWTH 3 DAYS Performed at [facility], [address], [place], [place] [address]    Report Status PENDING  Incomplete     Labs: Basic Metabolic Panel: Recent Labs  Lab 01/25/23 1108 01/27/23 0807 01/28/23 0702 01/29/23 1600 01/30/23 0559  NA 138 139 136 141 140  K 4.8 4.6 5.2* 6.2* 5.9*  CL 101 104 107 112* 109  CO2 22 27 23  19* 24  GLUCOSE 112* 98 90 85 102*  BUN 44* 51* 35* 47* 51*  CREATININE 2.89* 2.62* 2.02* 2.61* 2.76*  CALCIUM 9.2 8.6* 8.5* 8.9 8.9   Liver Function Tests: Recent Labs  Lab 01/25/23 1108 01/28/23 0702  AST 46* 31  ALT 41 37  ALKPHOS 184* 119  BILITOT 0.9 1.3*  PROT 6.7 6.0*  ALBUMIN 4.0* 2.8*   No results for input(s): "LIPASE", "AMYLASE" in the last 168 hours. No results for input(s): "AMMONIA" in the last 168 hours. CBC: Recent Labs  Lab 01/25/23 1108 01/27/23 0807 01/28/23 0702 01/29/23 1600 01/30/23 0559  WBC 16.5* 17.3* 16.4* 15.1* 15.6*  NEUTROABS 10.2* 8.7* 5.4 6.0  --   HGB 8.8* 8.0* 7.5* 8.0* 7.6*  HCT 26.5* 23.0* 22.4* 25.7* 23.2*  MCV 111* 110.6* 113.7* 120.1* 114.9*  PLT 381 265 245 222 190   Cardiac Enzymes: No results for input(s): "CKTOTAL", "CKMB", "CKMBINDEX", "TROPONINI" in the last 168 hours. BNP: Invalid input(s): "POCBNP" CBG: No results for input(s): "GLUCAP" in the last 168 hours.  Time coordinating discharge: 50 minutes  Signed:  [name]  DNP, APRN, FNP-C Patient Care [facility] [address] [place], [place] [address] [phone]  [facility] 01/31/2023, 9:01 AM

## 2023-01-31 LAB — OPIATES CONFIRMATION, URINE: Opiates: NEGATIVE ng/mL

## 2023-01-31 LAB — DRUG SCREEN 764883 11+OXYCO+ALC+CRT-BUND
Hydromorphone Confirm: 129 mg/dL (ref 20.0–300.0)
pH, Urine: 6.1 (ref 4.5–8.9)

## 2023-01-31 LAB — OXYCODONE/OXYMORPHONE, CONFIRM
OXYCODONE/OXYMORPH: POSITIVE — AB
OXYCODONE: 1823 ng/mL
OXYCODONE: POSITIVE — AB
OXYMORPHONE (GC/MS): >3000 ng/mL
OXYMORPHONE: POSITIVE — AB

## 2023-02-01 LAB — CULTURE, BLOOD (ROUTINE X 2)
Culture: NO GROWTH
Culture: NO GROWTH
Special Requests: ADEQUATE
Special Requests: ADEQUATE

## 2023-02-06 ENCOUNTER — Other Ambulatory Visit: Payer: Self-pay | Admitting: Family Medicine

## 2023-02-06 ENCOUNTER — Other Ambulatory Visit: Payer: Self-pay | Admitting: Nurse Practitioner

## 2023-02-06 ENCOUNTER — Other Ambulatory Visit: Payer: Medicare Other

## 2023-02-06 DIAGNOSIS — D5700 Hb-SS disease with crisis, unspecified: Secondary | ICD-10-CM

## 2023-02-06 DIAGNOSIS — D571 Sickle-cell disease without crisis: Secondary | ICD-10-CM

## 2023-02-06 DIAGNOSIS — G894 Chronic pain syndrome: Secondary | ICD-10-CM

## 2023-02-06 MED ORDER — OXYCODONE HCL 10 MG PO TABS
10.0000 mg | ORAL_TABLET | ORAL | 0 refills | Status: DC
Start: 2023-02-06 — End: 2023-02-23

## 2023-02-06 NOTE — Telephone Encounter (Signed)
Copied from CRM [id number]. Topic: Clinical - Medication Refill >> Feb 06, 2023  8:46 AM Desma Mcgregor wrote: Most Recent Primary Care Visit:  Provider: Ivonne Andrew  Department: [facility]  Visit Type: OFFICE VISIT  Date: 01/25/2023  Medication: Oxycodone HCl 10 MG TABS  Has the patient contacted their pharmacy? Yes (Agent: If no, request that the patient contact the pharmacy for the refill. If patient does not wish to contact the pharmacy document the reason why and proceed with request.) (Agent: If yes, when and what did the pharmacy advise?) Out of refills. Gets it filled every 2 wks  Is this the correct pharmacy for this prescription? Yes If no, delete pharmacy and type the correct one.  This is the patient's preferred pharmacy:  [facility] [address], [place] - [address] [place] [address] Phone: [phone] Fax: [phone]  Has the prescription been filled recently? Yes  Is the patient out of the medication? Yes  Has the patient been seen for an appointment in the last year OR does the patient have an upcoming appointment? Yes  Can we respond through MyChart? Yes  Agent: Please be advised that Rx refills may take up to 3 business days. We ask that you follow-up with your pharmacy.

## 2023-02-06 NOTE — Progress Notes (Signed)
Orders Placed This Encounter  Procedures   Sickle Cell Panel    Standing Status:   Future    Expiration Date:   02/06/2024   [name]  DNP, APRN, FNP-C [facility] [address] [place], [place] [address] [phone]

## 2023-02-07 LAB — CMP14+CBC/D/PLT+FER+RETIC+V...
ALT: 108 [IU]/L — ABNORMAL HIGH (ref 0–44)
AST: 114 [IU]/L — ABNORMAL HIGH (ref 0–40)
Albumin: 4.4 g/dL (ref 4.1–5.1)
Alkaline Phosphatase: 348 [IU]/L — ABNORMAL HIGH (ref 44–121)
BUN/Creatinine Ratio: 18 (ref 9–20)
BUN: 60 mg/dL — ABNORMAL HIGH (ref 6–24)
Basophils Absolute: 0 10*3/uL (ref 0.0–0.2)
Basos: 0 %
Bilirubin Total: 1.4 mg/dL — ABNORMAL HIGH (ref 0.0–1.2)
Calcium: 9.2 mg/dL (ref 8.7–10.2)
Chloride: 105 mmol/L (ref 96–106)
Creatinine, Ser: 3.33 mg/dL — ABNORMAL HIGH (ref 0.76–1.27)
EOS (ABSOLUTE): 0.4 10*3/uL (ref 0.0–0.4)
Eos: 2 %
Ferritin: 7216 ng/mL — ABNORMAL HIGH (ref 30–400)
Globulin, Total: 2.8 g/dL (ref 1.5–4.5)
Glucose: 97 mg/dL (ref 70–99)
Hematocrit: 23.7 % — ABNORMAL LOW (ref 37.5–51.0)
Hemoglobin: 8.3 g/dL — ABNORMAL LOW (ref 13.0–17.7)
Immature Grans (Abs): 0 10*3/uL (ref 0.0–0.1)
Immature Granulocytes: 0 %
Lymphocytes Absolute: 5.3 10*3/uL — ABNORMAL HIGH (ref 0.7–3.1)
Lymphs: 32 %
MCH: 38.4 pg — ABNORMAL HIGH (ref 26.6–33.0)
MCHC: 35 g/dL (ref 31.5–35.7)
MCV: 110 fL — ABNORMAL HIGH (ref 79–97)
Monocytes Absolute: 1.8 10*3/uL — ABNORMAL HIGH (ref 0.1–0.9)
Monocytes: 11 %
Neutrophils Absolute: 8.9 10*3/uL — ABNORMAL HIGH (ref 1.4–7.0)
Neutrophils: 55 %
Platelets: 192 10*3/uL (ref 150–450)
Potassium: 5.1 mmol/L (ref 3.5–5.2)
RBC: 2.16 x10E6/uL — CL (ref 4.14–5.80)
Retic Ct Pct: 6.2 % — ABNORMAL HIGH (ref 0.6–2.6)
Sodium: 138 mmol/L (ref 134–144)
Total Protein: 7.2 g/dL (ref 6.0–8.5)
Vit D, 25-Hydroxy: 21.8 ng/mL — ABNORMAL LOW (ref 30.0–100.0)
WBC: 16.5 10*3/uL — ABNORMAL HIGH (ref 3.4–10.8)
eGFR: 23 mL/min/{1.73_m2} — ABNORMAL LOW (ref >59)

## 2023-02-20 ENCOUNTER — Other Ambulatory Visit: Payer: Self-pay | Admitting: Nurse Practitioner

## 2023-02-20 DIAGNOSIS — D571 Sickle-cell disease without crisis: Secondary | ICD-10-CM

## 2023-02-20 NOTE — Telephone Encounter (Signed)
 Copied from CRM [id number]. Topic: Clinical - Medication Refill >> Feb 20, 2023 10:23 AM Alfonso ORN wrote: Most Recent Primary Care Visit:  Provider: [facility]  Department: [facility]  Visit Type: LAB  Date: 02/06/2023  Medication:  oxyCODONE  (OXYCONTIN ) 15 mg 12 hr tablet and  Oxycodone  HCl 10 MG TABS   Has the patient contacted their pharmacy? No reason are narcotics medication have to call provider  (Agent: If no, request that the patient contact the pharmacy for the refill. If patient does not wish to contact the pharmacy document the reason why and proceed with request.) (Agent: If yes, when and what did the pharmacy advise?)  Is this the correct pharmacy for this prescription? Yes  If no, delete pharmacy and type the correct one.  This is the patient's preferred pharmacy:  [id number] - [place], [place] - [address] [place] [address] Phone: [phone] Fax: [phone]  [id number] [place], [place] - [address] AT [facility] OF [facility] [address] [place] [address] Phone: [phone] Fax: [phone]   Has the prescription been filled recently? No   Is the patient out of the medication? Yes   Has the patient been seen for an appointment in the last year OR does the patient have an upcoming appointment?   Can we respond through MyChart? Yes   Agent: Please be advised that Rx refills may take up to 3 business days. We ask that you follow-up with your pharmacy.

## 2023-02-22 ENCOUNTER — Ambulatory Visit (INDEPENDENT_AMBULATORY_CARE_PROVIDER_SITE_OTHER): Payer: Medicare Other

## 2023-02-22 VITALS — Ht 63.0 in | Wt 125.0 lb

## 2023-02-22 DIAGNOSIS — Z0000 Encounter for general adult medical examination without abnormal findings: Secondary | ICD-10-CM

## 2023-02-22 NOTE — Patient Instructions (Signed)
 Mr. [name] [name] , Thank you for taking time to come for your Medicare Wellness Visit. I appreciate your ongoing commitment to your health goals. Please review the following plan we discussed and let me know if I can assist you in the future.   Referrals/Orders/Follow-Ups/Clinician Recommendations:   Next Medicare Annual Wellness Visit: February 28, 2024 at 11:20am virtual visit  This is a list of the screening recommended for you and due dates:  Health Maintenance  Topic Date Due   COVID-19 Vaccine (3 - Moderna risk series) 07/24/2019   Medicare Annual Wellness Visit  02/22/2024   DTaP/Tdap/Td vaccine (3 - Td or Tdap) 06/14/2027   Flu Shot  Completed   Hepatitis C Screening  Completed   HIV Screening  Completed   HPV Vaccine  Aged Out    Advanced directives: (Declined) Advance directive discussed with you today. Even though you declined this today, please call our office should you change your mind, and we can give you the proper paperwork for you to fill out.  Next Medicare Annual Wellness Visit scheduled for next year: yes  Preventive Care 2-94 Years Old, Male Preventive care refers to lifestyle choices and visits with your health care provider that can promote health and wellness. Preventive care visits are also called wellness exams. What can I expect for my preventive care visit? Counseling During your preventive care visit, your health care provider may ask about your: Medical history, including: Past medical problems. Family medical history. Current health, including: Emotional well-being. Home life and relationship well-being. Sexual activity. Lifestyle, including: Alcohol , nicotine  or tobacco, and drug use. Access to firearms. Diet, exercise, and sleep habits. Safety issues such as seatbelt and bike helmet use. Sunscreen use. Work and work astronomer. Physical exam Your health care provider may check your: Height and weight. These may be used to calculate your BMI  (body mass index). BMI is a measurement that tells if you are at a healthy weight. Waist circumference. This measures the distance around your waistline. This measurement also tells if you are at a healthy weight and may help predict your risk of certain diseases, such as type 2 diabetes and high blood pressure. Heart rate and blood pressure. Body temperature. Skin for abnormal spots. What immunizations do I need?  Vaccines are usually given at various ages, according to a schedule. Your health care provider will recommend vaccines for you based on your age, medical history, and lifestyle or other factors, such as travel or where you work. What tests do I need? Screening Your health care provider may recommend screening tests for certain conditions. This may include: Lipid and cholesterol levels. Diabetes screening. This is done by checking your blood sugar (glucose) after you have not eaten for a while (fasting). Hepatitis B test. Hepatitis C test. HIV (human immunodeficiency virus) test. STI (sexually transmitted infection) testing, if you are at risk. Talk with your health care provider about your test results, treatment options, and if necessary, the need for more tests. Follow these instructions at home: Eating and drinking  Eat a healthy diet that includes fresh fruits and vegetables, whole grains, lean protein, and low-fat dairy products. Drink enough fluid to keep your urine pale yellow. Take vitamin and mineral supplements as recommended by your health care provider. Do not drink alcohol  if your health care provider tells you not to drink. If you drink alcohol : Limit how much you have to 0-2 drinks a day. Know how much alcohol  is in your drink. In the [place] one  drink equals one 12 oz bottle of beer (355 mL), one 5 oz glass of wine (148 mL), or one 1 oz glass of hard liquor (44 mL). Lifestyle Brush your teeth every morning and night with fluoride toothpaste. Floss one time each  day. Exercise for at least 30 minutes 5 or more days each week. Do not use any products that contain nicotine  or tobacco. These products include cigarettes, chewing tobacco, and vaping devices, such as e-cigarettes. If you need help quitting, ask your health care provider. Do not use drugs. If you are sexually active, practice safe sex. Use a condom or other form of protection to prevent STIs. Find healthy ways to manage stress, such as: Meditation, yoga, or listening to music. Journaling. Talking to a trusted person. Spending time with friends and family. Minimize exposure to UV radiation to reduce your risk of skin cancer. Safety Always wear your seat belt while driving or riding in a vehicle. Do not drive: If you have been drinking alcohol . Do not ride with someone who has been drinking. If you have been using any mind-altering substances or drugs. While texting. When you are tired or distracted. Wear a helmet and other protective equipment during sports activities. If you have firearms in your house, make sure you follow all gun safety procedures. Seek help if you have been physically or sexually abused. What's next? Go to your health care provider once a year for an annual wellness visit. Ask your health care provider how often you should have your eyes and teeth checked. Stay up to date on all vaccines. This information is not intended to replace advice given to you by your health care provider. Make sure you discuss any questions you have with your health care provider. Document Revised: 07/28/2020 Document Reviewed: 07/28/2020 Elsevier Patient Education  2024 [employer].    Understanding Your Risk for Falls Millions of people have serious injuries from falls each year. It is important to understand your risk of falling. Talk with your health care provider about your risk and what you can do to lower it. If you do have a serious fall, make sure to tell your provider.  Falling once raises your risk of falling again. How can falls affect me? Serious injuries from falls are common. These include: Broken bones, such as hip fractures. Head injuries, such as traumatic brain injuries (TBI) or concussions. A fear of falling can cause you to avoid activities and stay at home. This can make your muscles weaker and raise your risk for a fall. What can increase my risk? There are a number of risk factors that increase your risk for falling. The more risk factors you have, the higher your risk of falling. Serious injuries from a fall happen most often to people who are older than 41 years old. Teenagers and young adults ages 90-29 are also at higher risk. Common risk factors include: Weakness in the lower body. Being generally weak or confused due to long-term (chronic) illness. Dizziness or balance problems. Poor vision. Medicines that cause dizziness or drowsiness. These may include: Medicines for your blood pressure, heart, anxiety, insomnia, or swelling (edema). Pain medicines. Muscle relaxants. Other risk factors include: Drinking alcohol . Having had a fall in the past. Having foot pain or wearing improper footwear. Working at a dangerous job. Having any of the following in your home: Tripping hazards, such as floor clutter or loose rugs. Poor lighting. Pets. Having dementia or memory loss. What actions can I take to lower  my risk of falling?     Physical activity Stay physically fit. Do strength and balance exercises. Consider taking a regular class to build strength and balance. Yoga and tai chi are good options. Vision Have your eyes checked every year and your prescription for glasses or contacts updated as needed. Shoes and walking aids Wear non-skid shoes. Wear shoes that have rubber soles and low heels. Do not wear high heels. Do not walk around the house in socks or slippers. Use a cane or walker as told by your provider. Home  safety Attach secure railings on both sides of your stairs. Install grab bars for your bathtub, shower, and toilet. Use a non-skid mat in your bathtub or shower. Attach bath mats securely with double-sided, non-slip rug tape. Use good lighting in all rooms. Keep a flashlight near your bed. Make sure there is a clear path from your bed to the bathroom. Use night-lights. Do not use throw rugs. Make sure all carpeting is taped or tacked down securely. Remove all clutter from walkways and stairways, including extension cords. Repair uneven or broken steps and floors. Avoid walking on icy or slippery surfaces. Walk on the grass instead of on icy or slick sidewalks. Use ice melter to get rid of ice on walkways in the winter. Use a cordless phone. Questions to ask your health care provider Can you help me check my risk for a fall? Do any of my medicines make me more likely to fall? Should I take a vitamin D  supplement? What exercises can I do to improve my strength and balance? Should I make an appointment to have my vision checked? Do I need a bone density test to check for weak bones (osteoporosis)? Would it help to use a cane or a walker? Where to find more information Centers for Disease Control and Prevention, STEADI: tonerpromos.no Community-Based Fall Prevention Programs: tonerpromos.no [employer] on Aging: baseringtones.pl Contact a health care provider if: You fall at home. You are afraid of falling at home. You feel weak, drowsy, or dizzy. This information is not intended to replace advice given to you by your health care provider. Make sure you discuss any questions you have with your health care provider. Document Revised: 10/03/2021 Document Reviewed: 10/03/2021 Elsevier Patient Education  2024 [employer] Managing Pain Without Opioids Opioids are strong medicines used to treat moderate to severe pain. For some people, especially those who have long-term (chronic) pain, opioids may not be  the best choice for pain management due to: Side effects like nausea, constipation, and sleepiness. The risk of addiction (opioid use disorder). The longer you take opioids, the greater your risk of addiction. Pain that lasts for more than 3 months is called chronic pain. Managing chronic pain usually requires more than one approach and is often provided by a team of health care providers working together (multidisciplinary approach). Pain management may be done at a pain management center or pain clinic. How to manage pain without the use of opioids Use non-opioid medicines Non-opioid medicines for pain may include: Over-the-counter or prescription non-steroidal anti-inflammatory drugs (NSAIDs). These may be the first medicines used for pain. They work well for muscle and bone pain, and they reduce swelling. Acetaminophen . This over-the-counter medicine may work well for milder pain but not swelling. Antidepressants. These may be used to treat chronic pain. A certain type of antidepressant (tricyclics) is often used. These medicines are given in lower doses for pain than when used for depression. Anticonvulsants. These are usually  used to treat seizures but may also reduce nerve (neuropathic) pain. Muscle relaxants. These relieve pain caused by sudden muscle tightening (spasms). You may also use a pain medicine that is applied to the skin as a patch, cream, or gel (topical analgesic), such as a numbing medicine. These may cause fewer side effects than medicines taken by mouth. Do certain therapies as directed Some therapies can help with pain management. They include: Physical therapy. You will do exercises to gain strength and flexibility. A physical therapist may teach you exercises to move and stretch parts of your body that are weak, stiff, or painful. You can learn these exercises at physical therapy visits and practice them at home. Physical therapy may also involve: Massage. Heat wraps or  applying heat or cold to affected areas. Electrical signals that interrupt pain signals (transcutaneous electrical nerve stimulation, TENS). Weak lasers that reduce pain and swelling (low-level laser therapy). Signals from your body that help you learn to regulate pain (biofeedback). Occupational therapy. This helps you to learn ways to function at home and work with less pain. Recreational therapy. This involves trying new activities or hobbies, such as a physical activity or drawing. Mental health therapy, including: Cognitive behavioral therapy (CBT). This helps you learn coping skills for dealing with pain. Acceptance and commitment therapy (ACT) to change the way you think and react to pain. Relaxation therapies, including muscle relaxation exercises and mindfulness-based stress reduction. Pain management counseling. This may be individual, family, or group counseling.  Receive medical treatments Medical treatments for pain management include: Nerve block injections. These may include a pain blocker and anti-inflammatory medicines. You may have injections: Near the spine to relieve chronic back or neck pain. Into joints to relieve back or joint pain. Into nerve areas that supply a painful area to relieve body pain. Into muscles (trigger point injections) to relieve some painful muscle conditions. A medical device placed near your spine to help block pain signals and relieve nerve pain or chronic back pain (spinal cord stimulation device). Acupuncture. Follow these instructions at home Medicines Take over-the-counter and prescription medicines only as told by your health care provider. If you are taking pain medicine, ask your health care providers about possible side effects to watch out for. Do not drive or use heavy machinery while taking prescription opioid pain medicine. Lifestyle  Do not use drugs or alcohol  to reduce pain. If you drink alcohol , limit how much you have to: 0-1  drink a day for women who are not pregnant. 0-2 drinks a day for men. Know how much alcohol  is in a drink. In the [place] one drink equals one 12 oz bottle of beer (355 mL), one 5 oz glass of wine (148 mL), or one 1 oz glass of hard liquor (44 mL). Do not use any products that contain nicotine  or tobacco. These products include cigarettes, chewing tobacco, and vaping devices, such as e-cigarettes. If you need help quitting, ask your health care provider. Eat a healthy diet and maintain a healthy weight. Poor diet and excess weight may make pain worse. Eat foods that are high in fiber. These include fresh fruits and vegetables, whole grains, and beans. Limit foods that are high in fat and processed sugars, such as fried and sweet foods. Exercise regularly. Exercise lowers stress and may help relieve pain. Ask your health care provider what activities and exercises are safe for you. If your health care provider approves, join an exercise class that combines movement and stress reduction. Examples  include yoga and tai chi. Get enough sleep. Lack of sleep may make pain worse. Lower stress as much as possible. Practice stress reduction techniques as told by your therapist. General instructions Work with all your pain management providers to find the treatments that work best for you. You are an important member of your pain management team. There are many things you can do to reduce pain on your own. Consider joining an online or in-person support group for people who have chronic pain. Keep all follow-up visits. This is important. Where to find more information You can find more information about managing pain without opioids from: American Academy of Pain Medicine: painmed.org Institute for Chronic Pain: instituteforchronicpain.org American Chronic Pain Association: theacpa.org Contact a health care provider if: You have side effects from pain medicine. Your pain gets worse or does not get better  with treatments or home therapy. You are struggling with anxiety or depression. Summary Many types of pain can be managed without opioids. Chronic pain may respond better to pain management without opioids. Pain is best managed when you and a team of health care providers work together. Pain management without opioids may include non-opioid medicines, medical treatments, physical therapy, mental health therapy, and lifestyle changes. Tell your health care providers if your pain gets worse or is not being managed well enough. This information is not intended to replace advice given to you by your health care provider. Make sure you discuss any questions you have with your health care provider. Document Revised: 05/12/2020 Document Reviewed: 05/12/2020 Elsevier Patient Education  2024 [employer].

## 2023-02-22 NOTE — Progress Notes (Signed)
 Because this visit was a virtual/telehealth visit,  certain criteria was not obtained, such a blood pressure, CBG if applicable, and timed get up and go. Any medications not marked as taking were not mentioned during the medication reconciliation part of the visit. Any vitals not documented were not able to be obtained due to this being a telehealth visit or patient was unable to self-report a recent blood pressure reading due to a lack of equipment at home via telehealth. Vitals that have been documented are verbally provided by the patient.  Interactive audio and video telecommunications were attempted between this provider and patient, however failed, due to patient having technical difficulties OR patient did not have access to video capability.  We continued and completed visit with audio only.  Subjective:   [name] [name]. is a 43 y.o. male who presents for Medicare Annual/Subsequent preventive examination.  Visit Complete: Virtual I connected with  [name] [name]. on 02/22/23 by a audio enabled telemedicine application and verified that I am speaking with the correct person using two identifiers.  Patient Location: Home  Provider Location: Home Office  I discussed the limitations of evaluation and management by telemedicine. The patient expressed understanding and agreed to proceed.  Vital Signs: Because this visit was a virtual/telehealth visit, some criteria may be missing or patient reported. Any vitals not documented were not able to be obtained and vitals that have been documented are patient reported.  Patient Medicare AWV questionnaire was completed by the patient on na; I have confirmed that all information answered by patient is correct and no changes since this date.  Cardiac Risk Factors include: advanced age (>65men, >57 women);male gender;sedentary lifestyle;Other (see comment), Risk factor comments: sickle cell, opioid dependence, CKD     Objective:     Today's Vitals   02/22/23 1440 02/22/23 1441  Weight: 125 lb (56.7 kg)   Height: 5' 3 (1.6 m)   PainSc:  5    Body mass index is 22.14 kg/m.     02/22/2023    2:45 PM 01/29/2023   10:30 AM 01/27/2023    7:09 AM 12/29/2022   12:19 PM 09/04/2022   11:14 AM 08/03/2022    4:22 PM 06/27/2022   10:59 AM  Advanced Directives  Does Patient Have a Medical Advance Directive? No  No No No No No  Would patient like information on creating a medical advance directive? No - Patient declined No - Patient declined  No - Patient declined No - Patient declined No - Patient declined No - Patient declined    Current Medications (verified) Outpatient Encounter Medications as of 02/22/2023  Medication Sig   amitriptyline  (ELAVIL ) 25 MG tablet Take 2 tablets (50 mg total) by mouth at bedtime. (Patient taking differently: Take 75 mg by mouth at bedtime.)   calcitRIOL  (ROCALTROL ) 0.25 MCG capsule Take 0.25 mcg by mouth daily with breakfast.   Deferiprone , Twice Daily, (FERRIPROX  TWICE-A-DAY) 1000 MG TABS Take 3,000 mg by mouth daily.   folic acid  (FOLVITE ) 1 MG tablet TAKE 1 TABLET BY MOUTH EVERY DAY (Patient taking differently: Take 1 mg by mouth every morning.)   hydroxyurea  (HYDREA ) 500 MG capsule Take 1 capsule by mouth in the morning on Sun/Tues/Wed/Thurs/Sat and 2 capsules on Mon/Fri   oxyCODONE  (OXYCONTIN ) 15 mg 12 hr tablet Take 1 tablet (15 mg) by mouth every 12 hours.   Oxycodone  HCl 10 MG TABS Take 1 tablet (10 mg total) by mouth See admin instructions. Take 10 mg  by mouth every 6 hours   sodium bicarbonate  650 MG tablet Take 1,950 mg by mouth with breakfast, with lunch, and with evening meal.   TYLENOL  8 HOUR ARTHRITIS PAIN 650 MG CR tablet Take 650 mg by mouth every 8 (eight) hours as needed for pain.   VELTASSA  16.8 g PACK Take 16.8 g by mouth daily.   methylPREDNISolone  (MEDROL  DOSEPAK) 4 MG TBPK tablet Take by mouth as directed. (Patient not taking: Reported on 01/27/2023)   [DISCONTINUED]  apixaban  (ELIQUIS ) 5 MG TABS tablet Take 5 mg by mouth 2 (two) times daily. (Patient not taking: Reported on 01/25/2023)   No facility-administered encounter medications on file as of 02/22/2023.    Allergies (verified) Nsaids and Morphine  and codeine    History: Past Medical History:  Diagnosis Date   Abscess of right iliac fossa 09/24/2018   required Perc Drain 09/24/2018   Arachnoid Cyst of brain bilaterally    2 really small ones in the back of my head; inside; saw them w/MRI (09/25/2012)   Bacterial pneumonia ~ 2012   caught it here in the hospital (09/25/2012)   Chronic kidney disease    from my sickle cell (09/25/2012)   CKD (chronic kidney disease), stage II    Colitis 04/19/2017   CT scan abd/ pelvis   GERD (gastroesophageal reflux disease)    after I eat alot of spicey foods (09/25/2012)   Gynecomastia, male 07/10/2012   History of blood transfusion    always related to sickle cell crisis (09/25/2012)   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   Noted in noted from [facility] at [facility]   Migraines    take RX qd to prevent them (09/25/2012)   Opioid dependence with withdrawal (HCC) 08/30/2012   Renal insufficiency    Sickle cell anemia (HCC)    Sickle cell crisis (HCC) 09/25/2012   Sickle cell nephropathy (HCC) 07/10/2012   Sinus tachycardia    Tachycardia with heart rate 121-140 beats per minute with ambulation 08/04/2016   Past Surgical History:  Procedure Laterality Date   ABCESS DRAINAGE     CHOLECYSTECTOMY  ~ 2012   COLONOSCOPY N/A 04/23/2017   Procedure: COLONOSCOPY;  Surgeon: [name], MD;  Location: [facility];  Service: Endoscopy;  Laterality: N/A;   IR FLUORO GUIDE CV LINE RIGHT  12/17/2016   IR FLUORO GUIDE CV LINE RIGHT  02/07/2021   IR RADIOLOGIST EVAL & MGMT  10/02/2018   IR RADIOLOGIST EVAL & MGMT  10/15/2018   IR REMOVAL TUN CV CATH W/O FL  12/21/2016   IR REMOVAL TUN CV CATH W/O FL  02/11/2021   IR US  GUIDE VASC ACCESS  RIGHT  12/17/2016   IR US  GUIDE VASC ACCESS RIGHT  02/07/2021   spleenectomy     Family History  Problem Relation Age of Onset   Breast cancer Mother    Social History   Socioeconomic History   Marital status: Single    Spouse name: Not on file   Number of children: Not on file   Years of education: Not on file   Highest education level: Not on file  Occupational History   Not on file  Tobacco Use   Smoking status: Former    Current packs/day: 0.10    Types: Cigarettes   Smokeless tobacco: Never  Vaping Use   Vaping status: Never Used  Substance and Sexual Activity   Alcohol  use: Yes    Comment: Once a month   Drug use: Yes  Types: Marijuana    Comment: Every 2-3 weeks    Sexual activity: Yes    Birth control/protection: Condom  Other Topics Concern   Not on file  Social History Narrative    Lives alone in [place].   Back at school, studying patent attorney. Unemployed.    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    Social Drivers of Health   Financial Resource Strain: Medium Risk (02/22/2023)   Overall Financial Resource Strain (CARDIA)    Difficulty of Paying Living Expenses: Somewhat hard  Food Insecurity: No Food Insecurity (02/22/2023)   Hunger Vital Sign    Worried About Running Out of Food in the Last Year: Never true    Ran Out of Food in the Last Year: Never true  Transportation Needs: No Transportation Needs (02/22/2023)   PRAPARE - Administrator, Civil Service (Medical): No    Lack of Transportation (Non-Medical): No  Physical Activity: Insufficiently Active (02/22/2023)   Exercise Vital Sign    Days of Exercise per Week: 3 days    Minutes of Exercise per Session: 20 min  Stress: No Stress Concern Present (02/22/2023)   [employer] of Occupational Health - Occupational Stress Questionnaire     Feeling of Stress : Not at all  Social Connections: Moderately Isolated (02/22/2023)   Social Connection and Isolation Panel [NHANES]    Frequency of Communication with Friends and Family: More than three times a week    Frequency of Social Gatherings with Friends and Family: More than three times a week    Attends Religious Services: More than 4 times per year    Active Member of [employer] or Organizations: No    Attends Engineer, Structural: Never    Marital Status: Divorced    Tobacco Counseling Counseling given: Yes   Clinical Intake:  Pre-visit preparation completed: Yes  Pain : No/denies pain Pain Score: 5  Pain Location: Leg Pain Orientation: Right, Left     BMI - recorded: 22.14 Nutritional Risks: None Diabetes: No  How often do you need to have someone help you when you read instructions, pamphlets, or other written materials from your doctor or pharmacy?: 1 - Never  Interpreter Needed?: No  Information entered by :: Tniya Bowditch, CMA   Activities of Daily Living    02/22/2023    2:43 PM 01/29/2023   10:30 AM  In your present state of health, do you have any difficulty performing the following activities:  Hearing? 0 0  Vision? 0 0  Difficulty concentrating or making decisions? 0 0  Walking or climbing stairs? 1   Comment sometimes. due to pain in legs from sickle cell   Dressing or bathing? 0   Doing errands, shopping? 0   Preparing Food and eating ? N   Using the Toilet? N   In the past six months, have you accidently leaked urine? N   Do you have problems with loss of bowel control? N   Managing your Medications? N   Managing your Finances? N   Housekeeping or managing your Housekeeping? N     Patient Care Team: [name], NP as PCP - General (Pulmonary Disease)  Indicate any recent Medical Services you may have received from other than [facility] providers in the past year (date may be approximate).  Assessment:   This is a routine  wellness examination for Saif.  Hearing/Vision screen Hearing Screening - Comments:: Denies any hearing difficulties  Vision Screening - Comments:: Wears rx glasses - up to date with routine eye exams  [facility]   Goals Addressed             This Visit's Progress    Patient Stated       To have children       Depression Screen    02/22/2023    2:46 PM 10/24/2022   10:02 AM 07/25/2022    9:48 AM 02/09/2022    8:38 AM 01/24/2022   11:17 AM 10/25/2021   10:51 AM 07/26/2021   10:26 AM  PHQ 2/9 Scores  PHQ - 2 Score 0 0 0 0 0 0 0  PHQ- 9 Score 0 1    0     Fall Risk    02/22/2023    2:45 PM 02/09/2022    8:39 AM 07/26/2021   10:26 AM 07/26/2021   10:25 AM 04/19/2021   10:43 AM  Fall Risk   Falls in the past year? 0 0 0 0 0  Number falls in past yr: 0 0 0 0 0  Injury with Fall? 0 0 0 0 0  Risk for fall due to : No Fall Risks No Fall Risks No Fall Risks    Follow up Falls prevention discussed Falls evaluation completed;Education provided;Falls prevention discussed Falls evaluation completed Falls evaluation completed     MEDICARE RISK AT HOME: Medicare Risk at Home Any stairs in or around the home?: No If so, are there any without handrails?: No Home free of loose throw rugs in walkways, pet beds, electrical cords, etc?: Yes Adequate lighting in your home to reduce risk of falls?: Yes Life alert?: No Use of a cane, walker or w/c?: Yes Grab bars in the bathroom?: No Shower chair or bench in shower?: Yes Elevated toilet seat or a handicapped toilet?: No  TIMED UP AND GO:  Was the test performed?  No    Cognitive Function:        02/22/2023    2:46 PM 02/09/2022    8:40 AM  6CIT Screen  What Year? 0 points 0 points  What month? 0 points 0 points  What time? 0 points 0 points  Count back from 20 0 points 0 points  Months in reverse 0 points --  Repeat phrase 0 points 0 points  Total Score 0 points     Immunizations Immunization History   Administered Date(s) Administered   Influenza Split 12/08/2011   Influenza,inj,Quad PF,6+ Mos 10/11/2012, 12/13/2016, 11/15/2017, 01/06/2019, 01/28/2019, 01/13/2020, 12/15/2020, 01/24/2022   Influenza-Unspecified 10/11/2012, 12/13/2016   Moderna Sars-Covid-2 Vaccination 05/29/2019, 06/26/2019   Pneumococcal Conjugate-13 08/23/2017   Pneumococcal Polysaccharide-23 12/13/2016   Td 09/18/2004   Tdap 06/13/2017    TDAP status: Up to date  Flu Vaccine status: Up to date  Pneumococcal vaccine status: Not age appropriate for this patient.   Covid-19 vaccine status: Information provided on how to obtain vaccines.   Qualifies for Shingles Vaccine? No  Not age appropriate for this patient  Screening Tests Health Maintenance  Topic Date Due   COVID-19 Vaccine (3 - Moderna risk series) 07/24/2019   Medicare Annual Wellness (AWV)  02/10/2023   DTaP/Tdap/Td (3 - Td or Tdap) 06/14/2027   INFLUENZA VACCINE  Completed   Hepatitis C Screening  Completed   HIV Screening  Completed  HPV VACCINES  Aged Out    Health Maintenance  Health Maintenance Due  Topic Date Due   COVID-19 Vaccine (3 - Moderna risk series) 07/24/2019   Medicare Annual Wellness (AWV)  02/10/2023    Colorectal Cancer Screenings: Not age appropriate for this patient.   Lung Cancer Screening: (Low Dose CT Chest recommended if Age 41-80 years, 20 pack-year currently smoking OR have quit w/in 15years.) does not qualify.   Lung Cancer Screening Referral: na  Additional Screening:  Hepatitis C Screening: does not qualify; Completed   Vision Screening: Recommended annual ophthalmology exams for early detection of glaucoma and other disorders of the eye. Is the patient up to date with their annual eye exam?  Yes  Who is the provider or what is the name of the office in which the patient attends annual eye exams? [facility]   Dental Screening: Recommended annual dental exams for proper oral  hygiene  Diabetic Foot Exam: na  Community Resource Referral / Chronic Care Management: CRR required this visit?  No   CCM required this visit?  No     Plan:     I have personally reviewed and noted the following in the patient's chart:   Medical and social history Use of alcohol , tobacco or illicit drugs  Current medications and supplements including opioid prescriptions. Patient is currently taking opioid prescriptions. Information provided to patient regarding non-opioid alternatives. Patient advised to discuss non-opioid treatment plan with their provider. Functional ability and status Nutritional status Physical activity Advanced directives List of other physicians Hospitalizations, surgeries, and ER visits in previous 12 months Vitals Screenings to include cognitive, depression, and falls Referrals and appointments  In addition, I have reviewed and discussed with patient certain preventive protocols, quality metrics, and best practice recommendations. A written personalized care plan for preventive services as well as general preventive health recommendations were provided to patient.     [facility] LABOR Corlene Sabia, CMA   02/22/2023   After Visit Summary: (MyChart) Due to this being a telephonic visit, the after visit summary with patients personalized plan was offered to patient via MyChart

## 2023-02-23 ENCOUNTER — Other Ambulatory Visit: Payer: Self-pay

## 2023-02-23 DIAGNOSIS — D571 Sickle-cell disease without crisis: Secondary | ICD-10-CM

## 2023-02-23 DIAGNOSIS — G894 Chronic pain syndrome: Secondary | ICD-10-CM

## 2023-02-23 MED ORDER — OXYCODONE HCL ER 15 MG PO T12A: 15.0000 mg | EXTENDED_RELEASE_TABLET | Freq: Two times a day (BID) | ORAL | 0 refills | Status: DC

## 2023-02-23 MED ORDER — OXYCODONE HCL 10 MG PO TABS: 10.0000 mg | ORAL_TABLET | ORAL | 0 refills | Status: DC

## 2023-02-23 NOTE — Telephone Encounter (Signed)
 Please advise [facility]

## 2023-02-27 ENCOUNTER — Other Ambulatory Visit: Payer: Self-pay

## 2023-03-02 ENCOUNTER — Non-Acute Institutional Stay (HOSPITAL_COMMUNITY)
Admission: RE | Admit: 2023-03-02 | Discharge: 2023-03-02 | Disposition: A | Discharge status: Home / Self Care | Payer: Medicare Other | Source: Ambulatory Visit | Attending: Internal Medicine | Admitting: Internal Medicine

## 2023-03-02 ENCOUNTER — Telehealth (HOSPITAL_COMMUNITY): Payer: Self-pay | Admitting: *Deleted

## 2023-03-02 DIAGNOSIS — G894 Chronic pain syndrome: Secondary | ICD-10-CM | POA: Insufficient documentation

## 2023-03-02 DIAGNOSIS — N184 Chronic kidney disease, stage 4 (severe): Secondary | ICD-10-CM | POA: Diagnosis not present

## 2023-03-02 DIAGNOSIS — D631 Anemia in chronic kidney disease: Secondary | ICD-10-CM | POA: Insufficient documentation

## 2023-03-02 DIAGNOSIS — D5700 Hb-SS disease with crisis, unspecified: Secondary | ICD-10-CM | POA: Diagnosis present

## 2023-03-02 DIAGNOSIS — Z79891 Long term (current) use of opiate analgesic: Secondary | ICD-10-CM | POA: Diagnosis not present

## 2023-03-02 LAB — CBC WITH DIFFERENTIAL/PLATELET
Abs Immature Granulocytes: 0.04 10*3/uL (ref 0.00–0.07)
Basophils Absolute: 0 10*3/uL (ref 0.0–0.1)
Basophils Relative: 0 %
Eosinophils Absolute: 0.1 10*3/uL (ref 0.0–0.5)
Eosinophils Relative: 1 %
HCT: 24.3 % — ABNORMAL LOW (ref 39.0–52.0)
Hemoglobin: 8.3 g/dL — ABNORMAL LOW (ref 13.0–17.0)
Immature Granulocytes: 1 %
Lymphocytes Relative: 43 %
Lymphs Abs: 3.2 10*3/uL (ref 0.7–4.0)
MCH: 40.1 pg — ABNORMAL HIGH (ref 26.0–34.0)
MCHC: 34.2 g/dL (ref 30.0–36.0)
MCV: 117.4 fL — ABNORMAL HIGH (ref 80.0–100.0)
Monocytes Absolute: 0.5 10*3/uL (ref 0.1–1.0)
Monocytes Relative: 7 %
Neutro Abs: 3.6 10*3/uL (ref 1.7–7.7)
Neutrophils Relative %: 48 %
Platelet Morphology: NORMAL
Platelets: 206 10*3/uL (ref 150–400)
RBC: 2.07 MIL/uL — ABNORMAL LOW (ref 4.22–5.81)
RDW: 21.9 % — ABNORMAL HIGH (ref 11.5–15.5)
WBC: 7.4 10*3/uL (ref 4.0–10.5)
nRBC: 0.8 % — ABNORMAL HIGH (ref 0.0–0.2)

## 2023-03-02 LAB — COMPREHENSIVE METABOLIC PANEL
ALT: 89 U/L — ABNORMAL HIGH (ref 0–44)
AST: 102 U/L — ABNORMAL HIGH (ref 15–41)
Albumin: 3.5 g/dL (ref 3.5–5.0)
Alkaline Phosphatase: 167 U/L — ABNORMAL HIGH (ref 38–126)
Anion gap: 12 (ref 5–15)
BUN: 46 mg/dL — ABNORMAL HIGH (ref 6–20)
CO2: 27 mmol/L (ref 22–32)
Calcium: 9.1 mg/dL (ref 8.9–10.3)
Chloride: 98 mmol/L (ref 98–111)
Creatinine, Ser: 3.58 mg/dL — ABNORMAL HIGH (ref 0.61–1.24)
GFR, Estimated: 21 mL/min — ABNORMAL LOW (ref >60)
Glucose, Bld: 99 mg/dL (ref 70–99)
Potassium: 3.9 mmol/L (ref 3.5–5.1)
Sodium: 137 mmol/L (ref 135–145)
Total Bilirubin: 2.4 mg/dL — ABNORMAL HIGH (ref 0.0–1.2)
Total Protein: 6.9 g/dL (ref 6.5–8.1)

## 2023-03-02 LAB — RETICULOCYTES
Immature Retic Fract: 33 % — ABNORMAL HIGH (ref 2.3–15.9)
RBC.: 2.1 MIL/uL — ABNORMAL LOW (ref 4.22–5.81)
Retic Count, Absolute: 135 10*3/uL (ref 19.0–186.0)
Retic Ct Pct: 6.4 % — ABNORMAL HIGH (ref 0.4–3.1)

## 2023-03-02 LAB — LACTATE DEHYDROGENASE: LDH: 193 U/L — ABNORMAL HIGH (ref 98–192)

## 2023-03-02 MED ORDER — HYDROMORPHONE 1 MG/ML IV SOLN
INTRAVENOUS | Status: DC
  Administered 2023-03-02: 9 mg via INTRAVENOUS
  Administered 2023-03-02: 30 mg via INTRAVENOUS
  Filled 2023-03-02: qty 30

## 2023-03-02 MED ORDER — NALOXONE HCL 0.4 MG/ML IJ SOLN: 0.4000 mg | INTRAMUSCULAR | Status: DC | PRN

## 2023-03-02 MED ORDER — SENNOSIDES-DOCUSATE SODIUM 8.6-50 MG PO TABS: 1.0000 | ORAL_TABLET | Freq: Two times a day (BID) | ORAL | Status: DC

## 2023-03-02 MED ORDER — ACETAMINOPHEN 500 MG PO TABS
1000.0000 mg | ORAL_TABLET | Freq: Once | ORAL | Status: AC
  Administered 2023-03-02: 1000 mg via ORAL
  Filled 2023-03-02: qty 2

## 2023-03-02 MED ORDER — ONDANSETRON HCL 4 MG/2ML IJ SOLN
4.0000 mg | Freq: Four times a day (QID) | INTRAMUSCULAR | Status: DC | PRN
  Administered 2023-03-02: 4 mg via INTRAVENOUS
  Filled 2023-03-02: qty 2

## 2023-03-02 MED ORDER — POLYETHYLENE GLYCOL 3350 17 G PO PACK: 17.0000 g | PACK | Freq: Every day | ORAL | Status: DC | PRN

## 2023-03-02 MED ORDER — SODIUM CHLORIDE 0.9% FLUSH: 9.0000 mL | INTRAVENOUS | Status: DC | PRN

## 2023-03-02 MED ORDER — SODIUM CHLORIDE 0.45 % IV SOLN: INTRAVENOUS | Status: DC

## 2023-03-02 MED ORDER — DIPHENHYDRAMINE HCL 25 MG PO CAPS
25.0000 mg | ORAL_CAPSULE | ORAL | Status: DC | PRN
  Administered 2023-03-02: 25 mg via ORAL
  Filled 2023-03-02: qty 1

## 2023-03-02 NOTE — Progress Notes (Signed)
Patient admitted to the day hospital today for sickle cell pain treatment. On arrival, pt reports 9/10 pain to left leg. Pt received Dilaudid PCA, IV Zofran, and hydrated with IV fluids via PIV. Pt also received PO Tylenol and Benadryl. At discharge, pt rates pain 3/10. AVS offered, but patient declined. Pt is alert, oriented, and ambulatory at discharge.

## 2023-03-02 NOTE — Discharge Summary (Signed)
Physician Discharge Summary  [name] [name]. NGE:[id number] DOB: 04/24/1980 DOA: 03/02/2023  PCP: Ivonne Andrew, NP  Admit date: 03/02/2023  Discharge date: 03/02/2023  Time spent: 30 minutes  Discharge Diagnoses:  Principal Problem:   Sickle cell anemia with pain [facility])   Discharge Condition: Stable  Diet recommendation: Regular  History of present illness:  [name] [name]. is a 43 y.o. male with history of sickle cell disease chronic pain syndrome, opiate dependence and tolerance, anemia of chronic disease, and CKD stage IV who presented to the day hospital today with major complaints of generalized body pain but primarily in his lower extremities that is consistent with his typical sickle cell pain crisis.  Pain intensity has worsened over the past few days especially in his lower extremities.  He is rating his pain at 9/10 this morning, characterized as throbbing and achy.  He does not know any aggravating nor relieving factor.  He took his home pain medications with no sustained relief.  He denies any fever, cough, chest pain, shortness of breath, nausea, vomiting or diarrhea.  No urinary symptoms.  He denies any recent travels, sick contacts or known exposure to COVID-19.  Hospital Course:  [name] [name]. was admitted to the day hospital with sickle cell painful crisis. Patient was treated with IV fluid, weight based IV Dilaudid PCA, clinician assisted doses as deemed appropriate, and other adjunct therapies per sickle cell pain management protocol. [name] [name] showed significant improvement symptomatically, pain improved from 9/10 to 3/10 at the time of discharge. Patient was discharged home in a hemodynamically stable condition. [name] [name] will follow-up at the clinic as previously scheduled, continue with home medications as per prior to admission.  Discharge Instructions We discussed the need for good hydration, monitoring of hydration status, avoidance of heat,  cold, stress, and infection triggers. We discussed the need to be compliant with taking Hydrea and other home medications. [name] [name] was reminded of the need to seek medical attention immediately if any symptom of bleeding, anemia, or infection occurs.  Discharge Exam: Vitals:   03/02/23 1424 03/02/23 1520  BP: 104/69 119/81  Pulse: 94 100  Resp: 14 14  Temp:  98.4 F (36.9 C)  SpO2: 95% 95%    General appearance: alert, cooperative and no distress Eyes: conjunctivae/corneas clear. PERRL, EOM's intact. Fundi benign. Neck: no adenopathy, no carotid bruit, no JVD, supple, symmetrical, trachea midline and thyroid not enlarged, symmetric, no tenderness/mass/nodules Back: symmetric, no curvature. ROM normal. No CVA tenderness. Resp: clear to auscultation bilaterally Chest wall: no tenderness Cardio: regular rate and rhythm, S1, S2 normal, no murmur, click, rub or gallop GI: soft, non-tender; bowel sounds normal; no masses,  no organomegaly Extremities: extremities normal, atraumatic, no cyanosis or edema Pulses: 2+ and symmetric Skin: Skin color, texture, turgor normal. No rashes or lesions Neurologic: Grossly normal  Discharge Instructions     Diet - low sodium heart healthy   Complete by: As directed    Increase activity slowly   Complete by: As directed       Allergies as of 03/02/2023       Reactions   Nsaids Other (See Comments)   Kidney disease   Morphine And Codeine Other (See Comments)   "Really bad headaches"         Medication List     TAKE these medications    amitriptyline 25 MG tablet Commonly known as: ELAVIL Take 2 tablets (50 mg total) by mouth at bedtime. What changed: how  much to take   calcitRIOL 0.25 MCG capsule Commonly known as: ROCALTROL Take 0.25 mcg by mouth daily with breakfast.   Ferriprox Twice-A-Day 1000 MG Tabs Generic drug: Deferiprone (Twice Daily) Take 3,000 mg by mouth daily.   folic acid 1 MG tablet Commonly known as:  FOLVITE TAKE 1 TABLET BY MOUTH EVERY DAY What changed: when to take this   hydroxyurea 500 MG capsule Commonly known as: HYDREA Take 1 capsule by mouth in the morning on Sun/Tues/Wed/Thurs/Sat and 2 capsules on Mon/Fri   methylPREDNISolone 4 MG Tbpk tablet Commonly known as: MEDROL DOSEPAK Take by mouth as directed.   Oxycodone HCl 10 MG Tabs Take 1 tablet (10 mg total) by mouth See admin instructions. Take 10 mg by mouth every 6 hours   oxyCODONE 15 mg 12 hr tablet Commonly known as: OXYCONTIN Take 1 tablet (15 mg) by mouth every 12 hours.   sodium bicarbonate 650 MG tablet Take 1,950 mg by mouth with breakfast, with lunch, and with evening meal.   Tylenol 8 Hour Arthritis Pain 650 MG CR tablet Generic drug: acetaminophen Take 650 mg by mouth every 8 (eight) hours as needed for pain.   Veltassa 16.8 g Pack Generic drug: Patiromer Sorbitex Calcium Take 16.8 g by mouth daily.       Allergies  Allergen Reactions   Nsaids Other (See Comments)    Kidney disease   Morphine And Codeine Other (See Comments)    "Really bad headaches"      Significant Diagnostic Studies: No results found.  Signed:  Jeanann Lewandowsky MD, MHA, FACP, Constance Goltz, CPE   03/02/2023, 3:28 PM

## 2023-03-02 NOTE — Telephone Encounter (Signed)
Patient called requesting to come to the day hospital for sickle cell pain. Patient reports left leg pain rated 10/10. Also reports some swelling in the left leg. Reports taking Oxycodone 15 mg and Oxycontin 10 mg around 1 hour before calling. COVID-19 screening done and patient denies all symptoms and exposures. Denies fever, chest pain, nausea, vomiting, diarrhea, abdominal pain and priapism. Admits to having transportation without driving self. Dr. Hyman Hopes notified. Patient can come to the day hospital for pain management. Patient advised and expresses an understanding.

## 2023-03-02 NOTE — H&P (Signed)
[facility] History and Physical  [name] [name]. GMW:[id number] DOB: May 23, 1980 DOA: 03/02/2023  PCP: Ivonne Andrew, NP   Chief Complaint: Sickle cell pain   HPI: [name] Pick. is a 43 y.o. male with history of sickle cell disease chronic pain syndrome, opiate dependence and tolerance, anemia of chronic disease, and CKD stage IV who presented to the day hospital today with major complaints of generalized body pain but primarily in his lower extremities that is consistent with his typical sickle cell pain crisis.  Pain intensity has worsened over the past few days especially in his lower extremities.  He is rating his pain at 9/10 this morning, characterized as throbbing and achy.  He does not know any aggravating nor relieving factor.  He took his home pain medications with no sustained relief.  He denies any fever, cough, chest pain, shortness of breath, nausea, vomiting or diarrhea.  No urinary symptoms.  He denies any recent travels, sick contacts or known exposure to COVID-19.  Systemic Review: General: The patient denies anorexia, fever, weight loss Cardiac: Denies chest pain, syncope, palpitations, pedal edema  Respiratory: Denies cough, shortness of breath, wheezing GI: Denies severe indigestion/heartburn, abdominal pain, nausea, vomiting, diarrhea and constipation GU: Denies hematuria, incontinence, dysuria  Musculoskeletal: Denies arthritis  Skin: Denies suspicious skin lesions Neurologic: Denies focal weakness or numbness, change in vision  Past Medical History:  Diagnosis Date   Abscess of right iliac fossa 09/24/2018   required Perc Drain 09/24/2018   Arachnoid Cyst of brain bilaterally    "2 really small ones in the back of my head; inside; saw them w/MRI" (09/25/2012)   Bacterial pneumonia ~ 2012   "caught it here in the hospital" (09/25/2012)   Chronic kidney disease    "from my sickle cell" (09/25/2012)   CKD (chronic kidney disease), stage  II    Colitis 04/19/2017   CT scan abd/ pelvis   GERD (gastroesophageal reflux disease)    "after I eat alot of spicey foods" (09/25/2012)   Gynecomastia, male 07/10/2012   History of blood transfusion    "always related to sickle cell crisis" (09/25/2012)   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   Noted in noted from Hematology notes at [facility]   Migraines    "take RX qd to prevent them" (09/25/2012)   Opioid dependence with withdrawal (HCC) 08/30/2012   Renal insufficiency    Sickle cell anemia (HCC)    Sickle cell crisis (HCC) 09/25/2012   Sickle cell nephropathy (HCC) 07/10/2012   Sinus tachycardia    Tachycardia with heart rate 121-140 beats per minute with ambulation 08/04/2016    Past Surgical History:  Procedure Laterality Date   ABCESS DRAINAGE     CHOLECYSTECTOMY  ~ 2012   COLONOSCOPY N/A 04/23/2017   Procedure: COLONOSCOPY;  Surgeon: Hilarie Fredrickson, MD;  Location: [facility];  Service: Endoscopy;  Laterality: N/A;   IR FLUORO GUIDE CV LINE RIGHT  12/17/2016   IR FLUORO GUIDE CV LINE RIGHT  02/07/2021   IR RADIOLOGIST EVAL & MGMT  10/02/2018   IR RADIOLOGIST EVAL & MGMT  10/15/2018   IR REMOVAL TUN CV CATH W/O FL  12/21/2016   IR REMOVAL TUN CV CATH W/O FL  02/11/2021   IR US GUIDE VASC ACCESS RIGHT  12/17/2016   IR US GUIDE VASC ACCESS RIGHT  02/07/2021   spleenectomy      Allergies  Allergen Reactions   Nsaids Other (See Comments)  Kidney disease   Morphine And Codeine Other (See Comments)    "Really bad headaches"     Family History  Problem Relation Age of Onset   Breast cancer Mother       Prior to Admission medications   Medication Sig Start Date End Date Taking? Authorizing Provider  amitriptyline (ELAVIL) 25 MG tablet Take 2 tablets (50 mg total) by mouth at bedtime. Patient taking differently: Take 75 mg by mouth at bedtime. 01/25/23   Ivonne Andrew, NP  calcitRIOL (ROCALTROL) 0.25 MCG capsule Take 0.25 mcg by mouth daily with  breakfast. 11/16/20   [provider]  Deferiprone, Twice Daily, (FERRIPROX TWICE-A-DAY) 1000 MG TABS Take 3,000 mg by mouth daily. 05/01/22   Massie Maroon, FNP  folic acid (FOLVITE) 1 MG tablet TAKE 1 TABLET BY MOUTH EVERY DAY Patient taking differently: Take 1 mg by mouth every morning. 03/22/20   Massie Maroon, FNP  hydroxyurea (HYDREA) 500 MG capsule Take 1 capsule by mouth in the morning on Sun/Tues/Wed/Thurs/Sat and 2 capsules on Mon/Fri 01/25/23   Ivonne Andrew, NP  methylPREDNISolone (MEDROL DOSEPAK) 4 MG TBPK tablet Take by mouth as directed. Patient not taking: Reported on 01/27/2023 01/19/23   [provider]  oxyCODONE (OXYCONTIN) 15 mg 12 hr tablet Take 1 tablet (15 mg) by mouth every 12 hours. 02/26/23   Ivonne Andrew, NP  Oxycodone HCl 10 MG TABS Take 1 tablet (10 mg total) by mouth See admin instructions. Take 10 mg by mouth every 6 hours 02/23/23   Ivonne Andrew, NP  sodium bicarbonate 650 MG tablet Take 1,950 mg by mouth with breakfast, with lunch, and with evening meal. 11/07/18   [provider]  TYLENOL 8 HOUR ARTHRITIS PAIN 650 MG CR tablet Take 650 mg by mouth every 8 (eight) hours as needed for pain.    [provider]  VELTASSA 16.8 g PACK Take 16.8 g by mouth daily. 10/10/21   [provider]     Physical Exam: There were no vitals filed for this visit.  General: Alert, awake, afebrile, anicteric, not in obvious distress HEENT: Normocephalic and Atraumatic, Mucous membranes pink                PERRLA; EOM intact; No scleral icterus,                 Nares: Patent, Oropharynx: Clear, Fair Dentition                 Neck: FROM, no cervical lymphadenopathy, thyromegaly, carotid bruit or JVD;  CHEST WALL: No tenderness  CHEST: Normal respiration, clear to auscultation bilaterally  HEART: Regular rate and rhythm; no murmurs rubs or gallops  BACK: No kyphosis or scoliosis; no CVA tenderness  ABDOMEN: Positive Bowel  Sounds, soft, non-tender; no masses, no organomegaly EXTREMITIES: No cyanosis, clubbing, or edema SKIN:  no rash or ulceration  CNS: Alert and Oriented x 4, Nonfocal exam, CN 2-12 intact  Labs on Admission:  Basic Metabolic Panel: No results for input(s): "NA", "K", "CL", "CO2", "GLUCOSE", "BUN", "CREATININE", "CALCIUM", "MG", "PHOS" in the last 168 hours. Liver Function Tests: No results for input(s): "AST", "ALT", "ALKPHOS", "BILITOT", "PROT", "ALBUMIN" in the last 168 hours. No results for input(s): "LIPASE", "AMYLASE" in the last 168 hours. No results for input(s): "AMMONIA" in the last 168 hours. CBC: No results for input(s): "WBC", "NEUTROABS", "HGB", "HCT", "MCV", "PLT" in the last 168 hours. Cardiac Enzymes: No results for input(s): "CKTOTAL", "  CKMB", "CKMBINDEX", "TROPONINI" in the last 168 hours.  BNP (last 3 results) No results for input(s): "BNP" in the last 8760 hours.  ProBNP (last 3 results) No results for input(s): "PROBNP" in the last 8760 hours.  CBG: No results for input(s): "GLUCAP" in the last 168 hours.   Assessment/Plan Principal Problem:   Sickle cell anemia with pain (HCC)  Admits to the [facility] for extended observation IVF 0.45% Saline @ 125 mls/hour Weight based Dilaudid PCA started within 30 minutes of admission IV Toradol is contraindicated due to CKD. Acetaminophen 1000 mg x 1 dose Labs: CBCD, CMP, Retic Count and LDH Monitor vitals very closely, Re-evaluate pain scale every hour 2 L of Oxygen by Central Patient will be re-evaluated for pain in the context of function and relationship to baseline as care progresses. If no significant relieve from pain (remains above 5/10) will transfer patient to inpatient services for further evaluation and management  Code Status: Full  Family Communication: None  DVT Prophylaxis: Ambulate as tolerated   Time spent: 35 Minutes  Jeanann Lewandowsky, MD, MHA, FACP, FAAP, CPE  If 7PM-7AM, please contact  night-coverage [email or web address] 03/02/2023, 9:32 AM

## 2023-03-06 ENCOUNTER — Other Ambulatory Visit (HOSPITAL_COMMUNITY): Payer: Self-pay

## 2023-03-07 LAB — HEMOGLOBIN A1C: Hemoglobin A1C: 8.3

## 2023-03-13 ENCOUNTER — Other Ambulatory Visit: Payer: Self-pay | Admitting: Nurse Practitioner

## 2023-03-13 DIAGNOSIS — D571 Sickle-cell disease without crisis: Secondary | ICD-10-CM

## 2023-03-13 DIAGNOSIS — G894 Chronic pain syndrome: Secondary | ICD-10-CM

## 2023-03-13 MED ORDER — OXYCODONE HCL 10 MG PO TABS: 10.0000 mg | ORAL_TABLET | ORAL | 0 refills | Status: DC

## 2023-03-13 NOTE — Telephone Encounter (Signed)
Copied from CRM [id number]. Topic: Clinical - Medication Refill >> Mar 13, 2023 10:22 AM Dennison Nancy wrote: Most Recent Primary Care Visit:  Provider: Recardo Evangelist A  Department: [facility]  Visit Type: MEDICARE AWV, SEQUENTIAL  Date: 02/22/2023  Medication: Oxycodone HCl 10 MG TABS  Has the patient contacted their pharmacy? No , have to call in due to a narcotic (Agent: If no, request that the patient contact the pharmacy for the refill. If patient does not wish to contact the pharmacy document the reason why and proceed with request.) (Agent: If yes, when and what did the pharmacy advise?)  Is this the correct pharmacy for this prescription? Yes If no, delete pharmacy and type the correct one.  This is the patient's preferred pharmacy:  [facility] [address], [place] - [address] AT [address] [place] [address] Phone: [phone] Fax: [phone]   Has the prescription been filled recently? Yes  Is the patient out of the medication? Yes  Has the patient been seen for an appointment in the last year OR does the patient have an upcoming appointment? Yes  Can we respond through MyChart? Yes  Agent: Please be advised that Rx refills may take up to 3 business days. We ask that you follow-up with your pharmacy.

## 2023-03-21 ENCOUNTER — Telehealth (HOSPITAL_COMMUNITY): Payer: Self-pay

## 2023-03-21 NOTE — Telephone Encounter (Signed)
 Pt called the day hospital wanting to come in today for sickle cell pain treatment. RN advised pt that due to staffing the day hospital is closed today. RN advised pt that the clinic will be open tomorrow, pt verbalized understanding.

## 2023-03-22 ENCOUNTER — Encounter (HOSPITAL_COMMUNITY): Payer: Self-pay | Admitting: Internal Medicine

## 2023-03-22 ENCOUNTER — Inpatient Hospital Stay (HOSPITAL_COMMUNITY)
Admission: AD | Admit: 2023-03-22 | Discharge: 2023-03-25 | DRG: 812 | Disposition: A | Discharge status: Home / Self Care | Payer: Medicare Other | Source: Ambulatory Visit | Attending: Internal Medicine | Admitting: Internal Medicine

## 2023-03-22 ENCOUNTER — Telehealth (HOSPITAL_COMMUNITY): Payer: Self-pay | Admitting: *Deleted

## 2023-03-22 ENCOUNTER — Other Ambulatory Visit: Payer: Self-pay

## 2023-03-22 DIAGNOSIS — R519 Headache, unspecified: Secondary | ICD-10-CM | POA: Diagnosis present

## 2023-03-22 DIAGNOSIS — F1120 Opioid dependence, uncomplicated: Secondary | ICD-10-CM | POA: Diagnosis present

## 2023-03-22 DIAGNOSIS — N184 Chronic kidney disease, stage 4 (severe): Secondary | ICD-10-CM | POA: Diagnosis present

## 2023-03-22 DIAGNOSIS — Z803 Family history of malignant neoplasm of breast: Secondary | ICD-10-CM

## 2023-03-22 DIAGNOSIS — R7401 Elevation of levels of liver transaminase levels: Secondary | ICD-10-CM | POA: Diagnosis present

## 2023-03-22 DIAGNOSIS — E875 Hyperkalemia: Secondary | ICD-10-CM | POA: Diagnosis present

## 2023-03-22 DIAGNOSIS — S0512XA Contusion of eyeball and orbital tissues, left eye, initial encounter: Secondary | ICD-10-CM | POA: Diagnosis present

## 2023-03-22 DIAGNOSIS — D5700 Hb-SS disease with crisis, unspecified: Secondary | ICD-10-CM | POA: Diagnosis present

## 2023-03-22 DIAGNOSIS — D638 Anemia in other chronic diseases classified elsewhere: Secondary | ICD-10-CM | POA: Diagnosis present

## 2023-03-22 DIAGNOSIS — F17210 Nicotine dependence, cigarettes, uncomplicated: Secondary | ICD-10-CM | POA: Diagnosis present

## 2023-03-22 DIAGNOSIS — Z9049 Acquired absence of other specified parts of digestive tract: Secondary | ICD-10-CM | POA: Diagnosis not present

## 2023-03-22 DIAGNOSIS — Z886 Allergy status to analgesic agent status: Secondary | ICD-10-CM | POA: Diagnosis not present

## 2023-03-22 DIAGNOSIS — Z9081 Acquired absence of spleen: Secondary | ICD-10-CM

## 2023-03-22 DIAGNOSIS — N179 Acute kidney failure, unspecified: Secondary | ICD-10-CM | POA: Diagnosis present

## 2023-03-22 DIAGNOSIS — R945 Abnormal results of liver function studies: Secondary | ICD-10-CM | POA: Diagnosis present

## 2023-03-22 DIAGNOSIS — F1290 Cannabis use, unspecified, uncomplicated: Secondary | ICD-10-CM | POA: Diagnosis present

## 2023-03-22 DIAGNOSIS — Z79899 Other long term (current) drug therapy: Secondary | ICD-10-CM | POA: Diagnosis not present

## 2023-03-22 DIAGNOSIS — G894 Chronic pain syndrome: Secondary | ICD-10-CM | POA: Diagnosis present

## 2023-03-22 DIAGNOSIS — R296 Repeated falls: Secondary | ICD-10-CM | POA: Diagnosis present

## 2023-03-22 DIAGNOSIS — Z8701 Personal history of pneumonia (recurrent): Secondary | ICD-10-CM

## 2023-03-22 DIAGNOSIS — W19XXXA Unspecified fall, initial encounter: Secondary | ICD-10-CM | POA: Diagnosis present

## 2023-03-22 DIAGNOSIS — D72829 Elevated white blood cell count, unspecified: Secondary | ICD-10-CM | POA: Diagnosis present

## 2023-03-22 DIAGNOSIS — Z885 Allergy status to narcotic agent status: Secondary | ICD-10-CM

## 2023-03-22 LAB — CBC WITH DIFFERENTIAL/PLATELET
Abs Immature Granulocytes: 0.04 10*3/uL (ref 0.00–0.07)
Basophils Absolute: 0 10*3/uL (ref 0.0–0.1)
Basophils Relative: 0 %
Eosinophils Absolute: 0 10*3/uL (ref 0.0–0.5)
Eosinophils Relative: 0 %
HCT: 19.8 % — ABNORMAL LOW (ref 39.0–52.0)
Hemoglobin: 6.9 g/dL — CL (ref 13.0–17.0)
Immature Granulocytes: 1 %
Lymphocytes Relative: 32 %
Lymphs Abs: 1.9 10*3/uL (ref 0.7–4.0)
MCH: 41.8 pg — ABNORMAL HIGH (ref 26.0–34.0)
MCHC: 34.8 g/dL (ref 30.0–36.0)
MCV: 120 fL — ABNORMAL HIGH (ref 80.0–100.0)
Monocytes Absolute: 0.4 10*3/uL (ref 0.1–1.0)
Monocytes Relative: 6 %
Neutro Abs: 3.6 10*3/uL (ref 1.7–7.7)
Neutrophils Relative %: 61 %
Platelet Morphology: NORMAL
Platelets: 222 10*3/uL (ref 150–400)
RBC: 1.65 MIL/uL — ABNORMAL LOW (ref 4.22–5.81)
RDW: 19.2 % — ABNORMAL HIGH (ref 11.5–15.5)
WBC: 5.9 10*3/uL (ref 4.0–10.5)
nRBC: 2.5 % — ABNORMAL HIGH (ref 0.0–0.2)

## 2023-03-22 LAB — COMPREHENSIVE METABOLIC PANEL
ALT: 96 U/L — ABNORMAL HIGH (ref 0–44)
AST: 129 U/L — ABNORMAL HIGH (ref 15–41)
Albumin: 3.3 g/dL — ABNORMAL LOW (ref 3.5–5.0)
Alkaline Phosphatase: 202 U/L — ABNORMAL HIGH (ref 38–126)
Anion gap: 10 (ref 5–15)
BUN: 49 mg/dL — ABNORMAL HIGH (ref 6–20)
CO2: 20 mmol/L — ABNORMAL LOW (ref 22–32)
Calcium: 8.6 mg/dL — ABNORMAL LOW (ref 8.9–10.3)
Chloride: 102 mmol/L (ref 98–111)
Creatinine, Ser: 3.7 mg/dL — ABNORMAL HIGH (ref 0.61–1.24)
GFR, Estimated: 20 mL/min — ABNORMAL LOW (ref >60)
Glucose, Bld: 91 mg/dL (ref 70–99)
Potassium: 5.8 mmol/L — ABNORMAL HIGH (ref 3.5–5.1)
Sodium: 132 mmol/L — ABNORMAL LOW (ref 135–145)
Total Bilirubin: 1.7 mg/dL — ABNORMAL HIGH (ref 0.0–1.2)
Total Protein: 6.8 g/dL (ref 6.5–8.1)

## 2023-03-22 LAB — LACTATE DEHYDROGENASE: LDH: 158 U/L (ref 98–192)

## 2023-03-22 MED ORDER — HYDROXYUREA 500 MG PO CAPS
500.0000 mg | ORAL_CAPSULE | ORAL | Status: DC
Start: 2023-03-24 — End: 2023-03-25
  Administered 2023-03-24 – 2023-03-25 (×2): 500 mg via ORAL
  Filled 2023-03-22 (×2): qty 1

## 2023-03-22 MED ORDER — POLYETHYLENE GLYCOL 3350 17 G PO PACK: 17.0000 g | PACK | Freq: Every day | ORAL | Status: DC | PRN

## 2023-03-22 MED ORDER — PATIROMER SORBITEX CALCIUM 8.4 G PO PACK
16.8000 g | PACK | Freq: Every day | ORAL | Status: DC
  Administered 2023-03-22 – 2023-03-25 (×4): 16.8 g via ORAL
  Filled 2023-03-22 (×4): qty 2

## 2023-03-22 MED ORDER — ACETAMINOPHEN 500 MG PO TABS
1000.0000 mg | ORAL_TABLET | Freq: Once | ORAL | Status: AC
  Administered 2023-03-22: 1000 mg via ORAL
  Filled 2023-03-22: qty 2

## 2023-03-22 MED ORDER — SODIUM CHLORIDE 0.9% FLUSH: 9.0000 mL | INTRAVENOUS | Status: DC | PRN

## 2023-03-22 MED ORDER — SODIUM CHLORIDE 0.45 % IV SOLN
INTRAVENOUS | Status: AC
  Administered 2023-03-22: 100 mL/h via INTRAVENOUS

## 2023-03-22 MED ORDER — ENOXAPARIN SODIUM 30 MG/0.3ML IJ SOSY: 30.0000 mg | PREFILLED_SYRINGE | INTRAMUSCULAR | Status: DC

## 2023-03-22 MED ORDER — DIPHENHYDRAMINE HCL 25 MG PO CAPS
25.0000 mg | ORAL_CAPSULE | ORAL | Status: DC | PRN
  Administered 2023-03-22 – 2023-03-23 (×4): 25 mg via ORAL
  Filled 2023-03-22 (×4): qty 1

## 2023-03-22 MED ORDER — AMITRIPTYLINE HCL 25 MG PO TABS
50.0000 mg | ORAL_TABLET | Freq: Every day | ORAL | Status: DC
  Administered 2023-03-24: 50 mg via ORAL
  Filled 2023-03-22 (×2): qty 2

## 2023-03-22 MED ORDER — FOLIC ACID 1 MG PO TABS
1.0000 mg | ORAL_TABLET | Freq: Every day | ORAL | Status: DC
  Administered 2023-03-23 – 2023-03-25 (×3): 1 mg via ORAL
  Filled 2023-03-22 (×4): qty 1

## 2023-03-22 MED ORDER — HEPARIN SODIUM (PORCINE) 5000 UNIT/ML IJ SOLN
5000.0000 [IU] | Freq: Three times a day (TID) | INTRAMUSCULAR | Status: DC
Start: 2023-03-22 — End: 2023-03-25
  Administered 2023-03-22 – 2023-03-23 (×4): 5000 [IU] via SUBCUTANEOUS
  Filled 2023-03-22 (×6): qty 1

## 2023-03-22 MED ORDER — NALOXONE HCL 0.4 MG/ML IJ SOLN: 0.4000 mg | INTRAMUSCULAR | Status: DC | PRN

## 2023-03-22 MED ORDER — DEFERIPRONE (TWICE DAILY) 1000 MG PO TABS: 3000.0000 mg | ORAL_TABLET | Freq: Every day | ORAL | Status: DC

## 2023-03-22 MED ORDER — OXYCODONE HCL ER 15 MG PO T12A
15.0000 mg | EXTENDED_RELEASE_TABLET | Freq: Two times a day (BID) | ORAL | Status: DC
  Administered 2023-03-22 – 2023-03-25 (×6): 15 mg via ORAL
  Filled 2023-03-22 (×6): qty 1

## 2023-03-22 MED ORDER — SENNOSIDES-DOCUSATE SODIUM 8.6-50 MG PO TABS
1.0000 | ORAL_TABLET | Freq: Two times a day (BID) | ORAL | Status: DC
  Filled 2023-03-22 (×5): qty 1

## 2023-03-22 MED ORDER — HYDROMORPHONE 1 MG/ML IV SOLN
INTRAVENOUS | Status: DC
  Administered 2023-03-22: 5 mg via INTRAVENOUS
  Administered 2023-03-22: 1 mL via INTRAVENOUS
  Administered 2023-03-22: 30 mg via INTRAVENOUS
  Administered 2023-03-22: 1.5 mg via INTRAVENOUS
  Administered 2023-03-23: 3.5 mL via INTRAVENOUS
  Administered 2023-03-23: 3 mg via INTRAVENOUS
  Administered 2023-03-23: 2 mg via INTRAVENOUS
  Administered 2023-03-23: 2.5 mg via INTRAVENOUS
  Administered 2023-03-23 (×2): 1.5 mL via INTRAVENOUS
  Administered 2023-03-24: 30 mg via INTRAVENOUS
  Administered 2023-03-24: 2 mg via INTRAVENOUS
  Administered 2023-03-24: 1 mL via INTRAVENOUS
  Administered 2023-03-24: 2.5 mg via INTRAVENOUS
  Administered 2023-03-24: 3 mg via INTRAVENOUS
  Administered 2023-03-24: 2 mg via INTRAVENOUS
  Administered 2023-03-25: 1 mg via INTRAVENOUS
  Administered 2023-03-25: 3 mg via INTRAVENOUS
  Filled 2023-03-22 (×2): qty 30

## 2023-03-22 MED ORDER — HYDROXYUREA 500 MG PO CAPS: 500.0000 mg | ORAL_CAPSULE | ORAL | Status: DC

## 2023-03-22 MED ORDER — OXYCODONE HCL 5 MG PO TABS
10.0000 mg | ORAL_TABLET | Freq: Four times a day (QID) | ORAL | Status: DC | PRN
  Administered 2023-03-23: 10 mg via ORAL
  Filled 2023-03-22: qty 2

## 2023-03-22 MED ORDER — ONDANSETRON HCL 4 MG/2ML IJ SOLN
4.0000 mg | Freq: Four times a day (QID) | INTRAMUSCULAR | Status: DC | PRN
  Administered 2023-03-22: 4 mg via INTRAVENOUS
  Filled 2023-03-22: qty 2

## 2023-03-22 MED ORDER — SENNOSIDES-DOCUSATE SODIUM 8.6-50 MG PO TABS: 1.0000 | ORAL_TABLET | Freq: Two times a day (BID) | ORAL | Status: DC

## 2023-03-22 MED ORDER — HYDROXYUREA 500 MG PO CAPS
1000.0000 mg | ORAL_CAPSULE | ORAL | Status: DC
Start: 2023-03-23 — End: 2023-03-25
  Administered 2023-03-23: 1000 mg via ORAL
  Filled 2023-03-22: qty 2

## 2023-03-22 NOTE — Plan of Care (Signed)

## 2023-03-22 NOTE — H&P (Signed)
 H&P  Patient Demographics:  [name] [name], is a 43 y.o. male  MRN: [id number]   DOB - July 27, 1980  Admit Date - 03/22/2023  Outpatient Primary MD for the patient is [name], NP  Chief Complaint  Patient presents with   Sickle Cell Pain Crisis      HPI:   [name] [name]  is a 43 y.o. male with history of sickle cell disease, chronic pain syndrome, opiate dependence and tolerance, anemia of chronic disease, and CKD stage IV who presented to the day hospital today with major complaints of generalized body pain but primarily in his head and lower extremities.  Patient claims he has had 3 falls in the last couple of weeks, he sustained cuts and bruises on his head and his lower extremities during these falls.  He reported having shaking lower extremities before before without any inciting factors.  Now he is in severe pain, rated at 10/10, generalized but mostly in the injured areas as described above.  He describes this pain as constant and achy and throbbing.  He took his home medications with no sustained relief.  He denies any fever, dizziness, headache, cough, blurry vision, chest pain, shortness of breath, nausea, vomiting or diarrhea.  No urinary symptoms.  He denies any recent travels, sick contacts or known exposure to COVID-19.  Of note, patient did not go to the emergency room after any of the falls even when he sustained deep cuts on his frontal skull area.  Day Hospital course: Patient was admitted to the hospital for sickle cell pain treatment.  He received IV Dilaudid  via PCA, IV Zofran , and other adjunct therapies per sickle cell pain management protocol including Benadryl  and p.o. Tylenol .  Patient continues to report significant pain.  His labs showed normal LDH, CMP showed hyperkalemia of 5.8, sodium of 132, creatinine of 3.7, slightly above his baseline of 3.3, mildly elevated transaminases and a GFR of 20.  His hemoglobin is down to 6.9, not at the threshold of blood  transfusion, otherwise CBC at baseline.  Patient will be admitted to the hospital for further evaluation and continued management of sickle cell pain crisis triggered by multiple falls at home.   Review of systems:  In addition to the HPI above, patient reports No fever or chills No Headache, No changes with vision or hearing No problems swallowing food or liquids No chest pain, cough or shortness of breath No abdominal pain, No nausea or vomiting, Bowel movements are regular No blood in stool or urine No dysuria No new skin rashes or bruises No new joints pains-aches No new weakness, tingling, numbness in any extremity No recent weight gain or loss No polyuria, polydypsia or polyphagia No significant Mental Stressors  A full 10 point Review of Systems was done, except as stated above, all other Review of Systems were negative.  With Past History of the following :   Past Medical History:  Diagnosis Date   Abscess of right iliac fossa 09/24/2018   required Perc Drain 09/24/2018   Arachnoid Cyst of brain bilaterally    2 really small ones in the back of my head; inside; saw them w/MRI (09/25/2012)   Bacterial pneumonia ~ 2012   caught it here in the hospital (09/25/2012)   Chronic kidney disease    from my sickle cell (09/25/2012)   CKD (chronic kidney disease), stage II    Colitis 04/19/2017   CT scan abd/ pelvis   GERD (gastroesophageal reflux disease)  after I eat alot of spicey foods (09/25/2012)   Gynecomastia, male 07/10/2012   History of blood transfusion    always related to sickle cell crisis (09/25/2012)   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   Noted in noted from Hematology notes at [facility]   Migraines    take RX qd to prevent them (09/25/2012)   Opioid dependence with withdrawal (HCC) 08/30/2012   Renal insufficiency    Sickle cell anemia (HCC)    Sickle cell crisis (HCC) 09/25/2012   Sickle cell nephropathy (HCC) 07/10/2012   Sinus  tachycardia    Tachycardia with heart rate 121-140 beats per minute with ambulation 08/04/2016      Past Surgical History:  Procedure Laterality Date   ABCESS DRAINAGE     CHOLECYSTECTOMY  ~ 2012   COLONOSCOPY N/A 04/23/2017   Procedure: COLONOSCOPY;  Surgeon: [name], MD;  Location: [facility];  Service: Endoscopy;  Laterality: N/A;   IR FLUORO GUIDE CV LINE RIGHT  12/17/2016   IR FLUORO GUIDE CV LINE RIGHT  02/07/2021   IR RADIOLOGIST EVAL & MGMT  10/02/2018   IR RADIOLOGIST EVAL & MGMT  10/15/2018   IR REMOVAL TUN CV CATH W/O FL  12/21/2016   IR REMOVAL TUN CV CATH W/O FL  02/11/2021   IR US  GUIDE VASC ACCESS RIGHT  12/17/2016   IR US  GUIDE VASC ACCESS RIGHT  02/07/2021   spleenectomy       Social History:   Social History   Tobacco Use   Smoking status: Former    Current packs/day: 0.10    Types: Cigarettes   Smokeless tobacco: Never  Substance Use Topics   Alcohol  use: Yes    Comment: Once a month     Lives - At home   Family History :   Family History  Problem Relation Age of Onset   Breast cancer Mother      Home Medications:   Prior to Admission medications   Medication Sig Start Date End Date Taking? Authorizing Provider  amitriptyline  (ELAVIL ) 25 MG tablet Take 2 tablets (50 mg total) by mouth at bedtime. Patient taking differently: Take 75 mg by mouth at bedtime. 01/25/23  Yes [name], NP  calcitRIOL  (ROCALTROL ) 0.25 MCG capsule Take 0.25 mcg by mouth daily with breakfast. 11/16/20  Yes [provider]  Deferiprone , Twice Daily, (FERRIPROX  TWICE-A-DAY) 1000 MG TABS Take 3,000 mg by mouth daily. 05/01/22  Yes Tilford Bertram HERO, FNP  folic acid  (FOLVITE ) 1 MG tablet TAKE 1 TABLET BY MOUTH EVERY DAY Patient taking differently: Take 1 mg by mouth every morning. 03/22/20  Yes Tilford Bertram HERO, FNP  hydroxyurea  (HYDREA ) 500 MG capsule Take 1 capsule by mouth in the morning on Sun/Tues/Wed/Thurs/Sat and 2 capsules on Mon/Fri 01/25/23  Yes  [name], NP  methylPREDNISolone  (MEDROL  DOSEPAK) 4 MG TBPK tablet Take by mouth as directed. 01/19/23  Yes [provider]  oxyCODONE  (OXYCONTIN ) 15 mg 12 hr tablet Take 1 tablet (15 mg) by mouth every 12 hours. 02/26/23  Yes [name], NP  Oxycodone  HCl 10 MG TABS Take 1 tablet (10 mg total) by mouth See admin instructions. Take 10 mg by mouth every 6 hours 03/13/23  Yes [name], NP  TYLENOL  8 HOUR ARTHRITIS PAIN 650 MG CR tablet Take 650 mg by mouth every 8 (eight) hours as needed for pain.   Yes [provider]  VELTASSA  16.8 g PACK Take 16.8 g by mouth daily.  10/10/21  Yes [provider]  sodium bicarbonate  650 MG tablet Take 1,950 mg by mouth with breakfast, with lunch, and with evening meal. 11/07/18   [provider]     Allergies:   Allergies  Allergen Reactions   Nsaids Other (See Comments)    Kidney disease   Morphine  And Codeine  Other (See Comments)    Really bad headaches      Physical Exam:   Vitals:   Vitals:   03/22/23 1145 03/22/23 1233  BP:  (!) 90/56  Pulse:  97  Resp: 16 14  Temp:    SpO2:  96%    Physical Exam: Constitutional: Patient appears well-developed and well-nourished. Not in obvious distress. HENT: Laceration on left side of frontal skull near the left eye brow. No active bleeding, Also few bruised areas on left supraorbital area. External right and left ear normal. Oropharynx is clear and moist.  Eyes: Conjunctivae and EOM are normal. PERRLA, no scleral icterus. Neck: Normal ROM. Neck supple. No JVD. No tracheal deviation. No thyromegaly. CVS: RRR, S1/S2 +, no murmurs, no gallops, no carotid bruit.  Pulmonary: Effort and breath sounds normal, no stridor, rhonchi, wheezes, rales.  Abdominal: Soft. BS +, no distension, tenderness, rebound or guarding.  Musculoskeletal: Normal range of motion. No edema and no tenderness.  Lymphadenopathy: No lymphadenopathy noted, cervical, inguinal or  axillary Neuro: Alert. Normal reflexes, muscle tone coordination. No cranial nerve deficit. Skin: Skin is warm and dry. No rash noted. Not diaphoretic. No erythema. No pallor. Psychiatric: Normal mood and affect. Behavior, judgment, thought content normal.   Data Review:   CBC Recent Labs  Lab 03/22/23 1145  WBC 5.9  HGB 6.9*  HCT 19.8*  PLT 222  MCV 120.0*  MCH 41.8*  MCHC 34.8  RDW 19.2*  LYMPHSABS 1.9  MONOABS 0.4  EOSABS 0.0  BASOSABS 0.0   ------------------------------------------------------------------------------------------------------------------  Chemistries  Recent Labs  Lab 03/22/23 1145  NA 132*  K 5.8*  CL 102  CO2 20*  GLUCOSE 91  BUN 49*  CREATININE 3.70*  CALCIUM  8.6*  AST 129*  ALT 96*  ALKPHOS 202*  BILITOT 1.7*   ------------------------------------------------------------------------------------------------------------------ CrCl cannot be calculated (Unknown ideal weight.). ------------------------------------------------------------------------------------------------------------------ No results for input(s): TSH, T4TOTAL, T3FREE, THYROIDAB in the last 72 hours.  Invalid input(s): FREET3  Coagulation profile No results for input(s): INR, PROTIME in the last 168 hours. ------------------------------------------------------------------------------------------------------------------- No results for input(s): DDIMER in the last 72 hours. -------------------------------------------------------------------------------------------------------------------  Cardiac Enzymes No results for input(s): CKMB, TROPONINI, MYOGLOBIN in the last 168 hours.  Invalid input(s): CK ------------------------------------------------------------------------------------------------------------------ No results found for:  BNP  ---------------------------------------------------------------------------------------------------------------  Urinalysis    Component Value Date/Time   COLORURINE YELLOW 04/03/2022 1011   APPEARANCEUR CLEAR 04/03/2022 1011   LABSPEC 1.008 04/03/2022 1011   PHURINE 8.0 04/03/2022 1011   GLUCOSEU 50 (A) 04/03/2022 1011   HGBUR NEGATIVE 04/03/2022 1011   HGBUR small 03/10/2010 1445   BILIRUBINUR NEGATIVE 04/03/2022 1011   BILIRUBINUR negative 04/19/2021 1217   BILIRUBINUR neg 09/30/2020 1117   KETONESUR NEGATIVE 04/03/2022 1011   PROTEINUR NEGATIVE 04/03/2022 1011   UROBILINOGEN 0.2 04/19/2021 1217   UROBILINOGEN 0.2 05/11/2017 1335   NITRITE NEGATIVE 04/03/2022 1011   LEUKOCYTESUR NEGATIVE 04/03/2022 1011    ----------------------------------------------------------------------------------------------------------------   Imaging Results:    No results found.   Assessment & Plan:  Principal Problem:   Sickle cell anemia with pain (HCC) Active Problems:   Hyperkalemia   Leukocytosis   Abnormal liver function  Chronic pain syndrome   Marijuana use   Nicotine  dependence, cigarettes, uncomplicated   Sickle cell anemia with crisis (HCC)  Hb Sickle Cell Disease with crisis: Admit patient, start IVF 0.45% Saline @ 100 mls/hour, start weight based Dilaudid  PCA, IV Dilaudid  is contraindicated due to CKD, Restart oral home pain medications, Monitor vitals very closely, Re-evaluate pain scale regularly, 2 L of Oxygen  by [place], Patient will be re-evaluated for pain in the context of function and relationship to baseline as care progresses. Acute Kidney Injury superimposed on CKD Stage IV: Serum creatinine is slightly above baseline, will hydrate gently. Avoid all nephrotoxins.  Anemia of Chronic Disease: Hgb is 6.9, slightly below baseline but not at the threshold of blood transfusion. Will monitor closely and transfuse as appropriate. Repeat labs in am. Chronic pain Syndrome:  Restart and continue oral home pain medications. Tobacco use disorder: oh Leontae was counseled on the dangers of tobacco use, and was advised to quit. Reviewed strategies to maximize success, including removing cigarettes and smoking materials from environment, stress management and support of family/friends.  Hyperkalemia: Resume home medication per Nephrologist.  DVT Prophylaxis: Subcut heparin  in view of CKD  AM Labs Ordered, also please review Full Orders  Family Communication: Admission, patient's condition and plan of care including tests being ordered have been discussed with the patient who indicate understanding and agree with the plan and Code Status.  Code Status: Full Code  Consults called: None    Admission status: Inpatient    Time spent in minutes : 50 minutes  Precious Schatz MD, MHA, CPE, FACP 03/22/2023 at 2:19 PM

## 2023-03-22 NOTE — Progress Notes (Signed)
 Pt is admitted to the day hospital today for sickle cell pain treatment. On arrival, pt rates pain 10/10 to back and bilateral legs. Pt received Dilaudid  PCA, IV Zofran , and hydrated with fluids via PIV. Pt also received PO Tylenol  and Benadryl . Pt is being admitted to inpatient by Dr. [name]. Report given to [place], [place]. PCA settings verified with Rosina HERO., RN. PCA and IV fluids infusing via PIV at transfer. Pt rates pain 9/10 at transfer. Pt is alert, oriented, and ambulatory at transfer. Pt transferred to 6 east, bed 5 in wheelchair by RN.

## 2023-03-22 NOTE — Progress Notes (Signed)
  Critical Value: Hgb 6.9  Time and Date notified: 03/22/23 @ 1306  Provider notified: [name], Olu MD  Action Taken: No new orders at this time

## 2023-03-22 NOTE — Telephone Encounter (Signed)
 Patient called requesting to come to the day hospital for sickle cell pain. Patient reports lower back and bilateral leg pain rated 10/10. Reports taking Oxycodone  10 mg and Oxycontin  15 mg at 6:30 am. COVID-19 screening done and patient denies all symptoms and exposures. Denies fever, chest pain, nausea, vomiting, diarrhea, abdominal pain and priapism. Admits to having transportation without driving self. Dr. [name] notified and advised that patient can come to the day hospital for pain management. Patient advised and expresses an understanding.

## 2023-03-23 DIAGNOSIS — D5700 Hb-SS disease with crisis, unspecified: Secondary | ICD-10-CM | POA: Diagnosis not present

## 2023-03-23 LAB — CBC
HCT: 17.2 % — ABNORMAL LOW (ref 39.0–52.0)
Hemoglobin: 6.1 g/dL — CL (ref 13.0–17.0)
MCH: 41.5 pg — ABNORMAL HIGH (ref 26.0–34.0)
MCHC: 35.5 g/dL (ref 30.0–36.0)
MCV: 117 fL — ABNORMAL HIGH (ref 80.0–100.0)
Platelets: 214 10*3/uL (ref 150–400)
RBC: 1.47 MIL/uL — ABNORMAL LOW (ref 4.22–5.81)
RDW: 19.5 % — ABNORMAL HIGH (ref 11.5–15.5)
WBC: 10 10*3/uL (ref 4.0–10.5)
nRBC: 1.3 % — ABNORMAL HIGH (ref 0.0–0.2)

## 2023-03-23 LAB — COMPREHENSIVE METABOLIC PANEL
ALT: 70 U/L — ABNORMAL HIGH (ref 0–44)
AST: 81 U/L — ABNORMAL HIGH (ref 15–41)
Albumin: 3 g/dL — ABNORMAL LOW (ref 3.5–5.0)
Alkaline Phosphatase: 178 U/L — ABNORMAL HIGH (ref 38–126)
Anion gap: 9 (ref 5–15)
BUN: 46 mg/dL — ABNORMAL HIGH (ref 6–20)
CO2: 19 mmol/L — ABNORMAL LOW (ref 22–32)
Calcium: 8.5 mg/dL — ABNORMAL LOW (ref 8.9–10.3)
Chloride: 105 mmol/L (ref 98–111)
Creatinine, Ser: 3.52 mg/dL — ABNORMAL HIGH (ref 0.61–1.24)
GFR, Estimated: 22 mL/min — ABNORMAL LOW (ref >60)
Glucose, Bld: 102 mg/dL — ABNORMAL HIGH (ref 70–99)
Potassium: 4.6 mmol/L (ref 3.5–5.1)
Sodium: 133 mmol/L — ABNORMAL LOW (ref 135–145)
Total Bilirubin: 1.5 mg/dL — ABNORMAL HIGH (ref 0.0–1.2)
Total Protein: 6.2 g/dL — ABNORMAL LOW (ref 6.5–8.1)

## 2023-03-23 LAB — PREPARE RBC (CROSSMATCH)

## 2023-03-23 MED ORDER — SODIUM CHLORIDE 0.9% IV SOLUTION
Freq: Once | INTRAVENOUS | Status: AC
  Administered 2023-03-23: 75 mL/h via INTRAVENOUS

## 2023-03-23 MED ORDER — SODIUM CHLORIDE 0.9% IV SOLUTION: Freq: Once | INTRAVENOUS | Status: AC

## 2023-03-23 MED ORDER — TRIPLE ANTIBIOTIC 3.5-400-5000 EX OINT
1.0000 | TOPICAL_OINTMENT | Freq: Three times a day (TID) | CUTANEOUS | Status: DC
  Administered 2023-03-23 – 2023-03-25 (×7): 1 via CUTANEOUS
  Filled 2023-03-23 (×4): qty 1

## 2023-03-23 NOTE — Plan of Care (Signed)

## 2023-03-23 NOTE — Progress Notes (Signed)
 SICKLE CELL SERVICE PROGRESS NOTE  [name] [name]. FMW:[id number] DOB: 1980-10-21 DOA: 03/22/2023 PCP: [name], NP  Assessment/Plan: Principal Problem:   Sickle cell anemia with pain (HCC) Active Problems:   Hyperkalemia   Leukocytosis   Abnormal liver function   Chronic pain syndrome   Marijuana use   Nicotine  dependence, cigarettes, uncomplicated   Sickle cell anemia with crisis (HCC)  Sickle cell pain crisis: Patient continues to have significant pain.  Pain rated as 9 out of 10.  His hemoglobin has dropped further to less than 7 g.  He is otherwise hemodynamically stable.  Continue Dilaudid  PCA, no Toradol  due to chronic kidney disease, continue assessment. Anemia of chronic disease: Hemoglobin 6.1.  Will transfuse 1 more unit of packed red blood cells. Chronic kidney disease stage IV: Continue to monitor his renal function.  Creatinine is 3.52. Chronic pain syndrome: Continue chronic home regimen Tobacco abuse: Counseling provided  Code Status: Full code Family Communication: No family at bedside Disposition Plan: Home when ready  [facility]  Pager [phone]. If 7PM-7AM, please contact night-coverage.  03/23/2023, 8:17 AM  LOS: 1 day   Brief narrative: [name] [name]  is a 43 y.o. male with history of sickle cell disease, chronic pain syndrome, opiate dependence and tolerance, anemia of chronic disease, and CKD stage IV who presented to the day hospital today with major complaints of generalized body pain but primarily in his head and lower extremities.  Patient claims he has had 3 falls in the last couple of weeks, he sustained cuts and bruises on his head and his lower extremities during these falls.  He reported having shaking lower extremities before before without any inciting factors.  Now he is in severe pain, rated at 10/10, generalized but mostly in the injured areas as described above.  He describes this pain as constant and achy and throbbing.  He  took his home medications with no sustained relief.  He denies any fever, dizziness, headache, cough, blurry vision, chest pain, shortness of breath, nausea, vomiting or diarrhea.  No urinary symptoms.  He denies any recent travels, sick contacts or known exposure to COVID-19.  Of note, patient did not go to the emergency room after any of the falls even when he sustained deep cuts on his frontal skull area.   Consultants: None  Procedures: Blood transfusion  Antibiotics: None  HPI/Subjective: Patient is improving.  Pain is at 7 out of 10.  Mainly back and legs.  Hemoglobin down to 6.1.  Objective: Vitals:   03/22/23 2305 03/22/23 2350 03/23/23 0354 03/23/23 0729  BP: 98/71     Pulse: 94     Resp: 18 17 16 12   Temp: 98 F (36.7 C)     TempSrc: Oral     SpO2: 100% 100% 99% 93%  Weight:      Height:       Weight change:   Intake/Output Summary (Last 24 hours) at 03/23/2023 0817 Last data filed at 03/23/2023 0500 Gross per 24 hour  Intake 1927.71 ml  Output 1000 ml  Net 927.71 ml    General: Alert, awake, oriented x3, in no acute distress.  HEENT: [place]/AT PEERL, EOMI Neck: Trachea midline,  no masses, no thyromegal,y no JVD, no carotid bruit OROPHARYNX:  Moist, No exudate/ erythema/lesions.  Heart: Regular rate and rhythm, without murmurs, rubs, gallops, PMI non-displaced, no heaves or thrills on palpation.  Lungs: Clear to auscultation, no wheezing or rhonchi noted. No increased vocal fremitus resonant  to percussion  Abdomen: Soft, nontender, nondistended, positive bowel sounds, no masses no hepatosplenomegaly noted..  Neuro: No focal neurological deficits noted cranial nerves II through XII grossly intact. DTRs 2+ bilaterally upper and lower extremities. Strength 5 out of 5 in bilateral upper and lower extremities. Musculoskeletal: No warm swelling or erythema around joints, no spinal tenderness noted. Psychiatric: Patient alert and oriented x3, good insight and cognition, good  recent to remote recall. Lymph node survey: No cervical axillary or inguinal lymphadenopathy noted.   Data Reviewed: Basic Metabolic Panel: Recent Labs  Lab 03/22/23 1145  NA 132*  K 5.8*  CL 102  CO2 20*  GLUCOSE 91  BUN 49*  CREATININE 3.70*  CALCIUM  8.6*   Liver Function Tests: Recent Labs  Lab 03/22/23 1145  AST 129*  ALT 96*  ALKPHOS 202*  BILITOT 1.7*  PROT 6.8  ALBUMIN  3.3*   No results for input(s): LIPASE, AMYLASE in the last 168 hours. No results for input(s): AMMONIA in the last 168 hours. CBC: Recent Labs  Lab 03/22/23 1145 03/23/23 0630  WBC 5.9 10.0  NEUTROABS 3.6  --   HGB 6.9* 6.1*  HCT 19.8* 17.2*  MCV 120.0* 117.0*  PLT 222 214   Cardiac Enzymes: No results for input(s): CKTOTAL, CKMB, CKMBINDEX, TROPONINI in the last 168 hours. BNP (last 3 results) No results for input(s): BNP in the last 8760 hours.  ProBNP (last 3 results) No results for input(s): PROBNP in the last 8760 hours.  CBG: No results for input(s): GLUCAP in the last 168 hours.  No results found for this or any previous visit (from the past 240 hours).   Studies: No results found.  Scheduled Meds:  sodium chloride    Intravenous Once   amitriptyline   50 mg Oral QHS   Deferiprone  (Twice Daily)  3,000 mg Oral Daily   folic acid   1 mg Oral Daily   heparin  injection (subcutaneous)  5,000 Units Subcutaneous Q8H   HYDROmorphone    Intravenous Q4H   [START ON 03/24/2023] hydroxyurea   500 mg Oral Once per day on Sunday Tuesday Wednesday Thursday Saturday   And   hydroxyurea   1,000 mg Oral Once per day on Monday Friday   neomycin -bacitracin -polymyxin  1 Application Apply externally TID   oxyCODONE   15 mg Oral Q12H   patiromer   16.8 g Oral Daily   senna-docusate  1 tablet Oral BID   Continuous Infusions:  sodium chloride  100 mL/hr at 03/23/23 0300    Principal Problem:   Sickle cell anemia with pain (HCC) Active Problems:   Hyperkalemia    Leukocytosis   Abnormal liver function   Chronic pain syndrome   Marijuana use   Nicotine  dependence, cigarettes, uncomplicated   Sickle cell anemia with crisis (HCC)
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
## 2023-03-24 DIAGNOSIS — D5700 Hb-SS disease with crisis, unspecified: Secondary | ICD-10-CM | POA: Diagnosis not present

## 2023-03-24 LAB — COMPREHENSIVE METABOLIC PANEL
ALT: 49 U/L — ABNORMAL HIGH (ref 0–44)
AST: 47 U/L — ABNORMAL HIGH (ref 15–41)
Albumin: 3 g/dL — ABNORMAL LOW (ref 3.5–5.0)
Alkaline Phosphatase: 162 U/L — ABNORMAL HIGH (ref 38–126)
Anion gap: 6 (ref 5–15)
BUN: 34 mg/dL — ABNORMAL HIGH (ref 6–20)
CO2: 20 mmol/L — ABNORMAL LOW (ref 22–32)
Calcium: 8.5 mg/dL — ABNORMAL LOW (ref 8.9–10.3)
Chloride: 112 mmol/L — ABNORMAL HIGH (ref 98–111)
Creatinine, Ser: 2.61 mg/dL — ABNORMAL HIGH (ref 0.61–1.24)
GFR, Estimated: 31 mL/min — ABNORMAL LOW (ref >60)
Glucose, Bld: 109 mg/dL — ABNORMAL HIGH (ref 70–99)
Potassium: 5.3 mmol/L — ABNORMAL HIGH (ref 3.5–5.1)
Sodium: 138 mmol/L (ref 135–145)
Total Bilirubin: 1.4 mg/dL — ABNORMAL HIGH (ref 0.0–1.2)
Total Protein: 6.4 g/dL — ABNORMAL LOW (ref 6.5–8.1)

## 2023-03-24 MED ORDER — SODIUM CHLORIDE 0.9 % IV SOLN: INTRAVENOUS | Status: DC

## 2023-03-24 MED ORDER — PHENOL 1.4 % MT LIQD
1.0000 | OROMUCOSAL | Status: DC | PRN
  Administered 2023-03-24: 1 via OROMUCOSAL
  Filled 2023-03-24: qty 177

## 2023-03-24 NOTE — Plan of Care (Signed)

## 2023-03-24 NOTE — Progress Notes (Signed)
 SICKLE CELL SERVICE PROGRESS NOTE  [name] [name]. FMW:[id number] DOB: April 11, 1980 DOA: 03/22/2023 PCP: [name], NP  Assessment/Plan: Principal Problem:   Sickle cell anemia with pain (HCC) Active Problems:   Hyperkalemia   Leukocytosis   Abnormal liver function   Chronic pain syndrome   Marijuana use   Nicotine  dependence, cigarettes, uncomplicated   Sickle cell anemia with crisis (HCC)  Sickle cell pain crisis: Patient continues to have significant pain.  Pain rated as 6 out of 10.  He is being checked.  He is transfuse 1 more unit of packed red blood cells yesterday.  Continue Dilaudid  PCA, no Toradol  due to chronic kidney disease, continue assessment. Anemia of chronic disease: Hemoglobin was 6.1.  Patient was transfused a unit of packed red blood cells yesterday.  Will follow H&H.   Chronic kidney disease stage IV: Continue to monitor his renal function.  Creatinine is 2.61 Chronic pain syndrome: Continue chronic home regimen Tobacco abuse: Counseling provided  Code Status: Full code Family Communication: No family at bedside Disposition Plan: Home when ready  [facility]  Pager [phone]. If 7PM-7AM, please contact night-coverage.  03/24/2023, 8:26 PM  LOS: 2 days   Brief narrative: [name] [name]  is a 43 y.o. male with history of sickle cell disease, chronic pain syndrome, opiate dependence and tolerance, anemia of chronic disease, and CKD stage IV who presented to the day hospital today with major complaints of generalized body pain but primarily in his head and lower extremities.  Patient claims he has had 3 falls in the last couple of weeks, he sustained cuts and bruises on his head and his lower extremities during these falls.  He reported having shaking lower extremities before before without any inciting factors.  Now he is in severe pain, rated at 10/10, generalized but mostly in the injured areas as described above.  He describes this pain as constant  and achy and throbbing.  He took his home medications with no sustained relief.  He denies any fever, dizziness, headache, cough, blurry vision, chest pain, shortness of breath, nausea, vomiting or diarrhea.  No urinary symptoms.  He denies any recent travels, sick contacts or known exposure to COVID-19.  Of note, patient did not go to the emergency room after any of the falls even when he sustained deep cuts on his frontal skull area.   Consultants: None  Procedures: Blood transfusion  Antibiotics: None  HPI/Subjective: Patient is improving.  Pain is at 6 out of 10.  Mainly back and legs.  Renal function has improved.  Awaiting hemoglobin.  Objective: Vitals:   03/24/23 1154 03/24/23 1458 03/24/23 1604 03/24/23 2007  BP:  107/78    Pulse:  99    Resp: 17 15 13 18   Temp:  97.9 F (36.6 C)    TempSrc:  Oral    SpO2: 95% 100% 100%   Weight:      Height:       Weight change:   Intake/Output Summary (Last 24 hours) at 03/24/2023 2026 Last data filed at 03/24/2023 1600 Gross per 24 hour  Intake 1378.68 ml  Output 2700 ml  Net -1321.32 ml    General: Alert, awake, oriented x3, in no acute distress.  HEENT: [place]/AT PEERL, EOMI Neck: Trachea midline,  no masses, no thyromegal,y no JVD, no carotid bruit OROPHARYNX:  Moist, No exudate/ erythema/lesions.  Heart: Regular rate and rhythm, without murmurs, rubs, gallops, PMI non-displaced, no heaves or thrills on palpation.  Lungs: Clear  to auscultation, no wheezing or rhonchi noted. No increased vocal fremitus resonant to percussion  Abdomen: Soft, nontender, nondistended, positive bowel sounds, no masses no hepatosplenomegaly noted..  Neuro: No focal neurological deficits noted cranial nerves II through XII grossly intact. DTRs 2+ bilaterally upper and lower extremities. Strength 5 out of 5 in bilateral upper and lower extremities. Musculoskeletal: No warm swelling or erythema around joints, no spinal tenderness noted. Psychiatric:  Patient alert and oriented x3, good insight and cognition, good recent to remote recall. Lymph node survey: No cervical axillary or inguinal lymphadenopathy noted.   Data Reviewed: Basic Metabolic Panel: Recent Labs  Lab 03/22/23 1145 03/23/23 0630 03/24/23 1125  NA 132* 133* 138  K 5.8* 4.6 5.3*  CL 102 105 112*  CO2 20* 19* 20*  GLUCOSE 91 102* 109*  BUN 49* 46* 34*  CREATININE 3.70* 3.52* 2.61*  CALCIUM  8.6* 8.5* 8.5*   Liver Function Tests: Recent Labs  Lab 03/22/23 1145 03/23/23 0630 03/24/23 1125  AST 129* 81* 47*  ALT 96* 70* 49*  ALKPHOS 202* 178* 162*  BILITOT 1.7* 1.5* 1.4*  PROT 6.8 6.2* 6.4*  ALBUMIN  3.3* 3.0* 3.0*   No results for input(s): LIPASE, AMYLASE in the last 168 hours. No results for input(s): AMMONIA in the last 168 hours. CBC: Recent Labs  Lab 03/22/23 1145 03/23/23 0630  WBC 5.9 10.0  NEUTROABS 3.6  --   HGB 6.9* 6.1*  HCT 19.8* 17.2*  MCV 120.0* 117.0*  PLT 222 214   Cardiac Enzymes: No results for input(s): CKTOTAL, CKMB, CKMBINDEX, TROPONINI in the last 168 hours. BNP (last 3 results) No results for input(s): BNP in the last 8760 hours.  ProBNP (last 3 results) No results for input(s): PROBNP in the last 8760 hours.  CBG: No results for input(s): GLUCAP in the last 168 hours.  No results found for this or any previous visit (from the past 240 hours).   Studies: No results found.  Scheduled Meds:  amitriptyline   50 mg Oral QHS   Deferiprone  (Twice Daily)  3,000 mg Oral Daily   folic acid   1 mg Oral Daily   heparin  injection (subcutaneous)  5,000 Units Subcutaneous Q8H   HYDROmorphone    Intravenous Q4H   hydroxyurea   500 mg Oral Once per day on Sunday Tuesday Wednesday Thursday Saturday   And   hydroxyurea   1,000 mg Oral Once per day on Monday Friday   neomycin -bacitracin -polymyxin  1 Application Apply externally TID   oxyCODONE   15 mg Oral Q12H   patiromer   16.8 g Oral Daily   senna-docusate  1  tablet Oral BID   Continuous Infusions:  sodium chloride  75 mL/hr at 03/24/23 1546    Principal Problem:   Sickle cell anemia with pain (HCC) Active Problems:   Hyperkalemia   Leukocytosis   Abnormal liver function   Chronic pain syndrome   Marijuana use   Nicotine  dependence, cigarettes, uncomplicated   Sickle cell anemia with crisis (HCC)

## 2023-03-24 NOTE — Progress Notes (Signed)
 Took call from lab stating that blood specimen clotted and order needed to be replaced and recollected. Order placed again for CMP.

## 2023-03-24 NOTE — Plan of Care (Signed)

## 2023-03-25 DIAGNOSIS — D5700 Hb-SS disease with crisis, unspecified: Secondary | ICD-10-CM | POA: Diagnosis not present

## 2023-03-25 LAB — COMPREHENSIVE METABOLIC PANEL
ALT: 40 U/L (ref 0–44)
AST: 38 U/L (ref 15–41)
Albumin: 3.1 g/dL — ABNORMAL LOW (ref 3.5–5.0)
Alkaline Phosphatase: 166 U/L — ABNORMAL HIGH (ref 38–126)
Anion gap: 6 (ref 5–15)
BUN: 30 mg/dL — ABNORMAL HIGH (ref 6–20)
CO2: 18 mmol/L — ABNORMAL LOW (ref 22–32)
Calcium: 9 mg/dL (ref 8.9–10.3)
Chloride: 112 mmol/L — ABNORMAL HIGH (ref 98–111)
Creatinine, Ser: 2.16 mg/dL — ABNORMAL HIGH (ref 0.61–1.24)
GFR, Estimated: 39 mL/min — ABNORMAL LOW (ref >60)
Glucose, Bld: 98 mg/dL (ref 70–99)
Potassium: 5.3 mmol/L — ABNORMAL HIGH (ref 3.5–5.1)
Sodium: 136 mmol/L (ref 135–145)
Total Bilirubin: 1.2 mg/dL (ref 0.0–1.2)
Total Protein: 6.5 g/dL (ref 6.5–8.1)

## 2023-03-25 LAB — CBC
HCT: 23 % — ABNORMAL LOW (ref 39.0–52.0)
Hemoglobin: 7.4 g/dL — ABNORMAL LOW (ref 13.0–17.0)
MCH: 36.6 pg — ABNORMAL HIGH (ref 26.0–34.0)
MCHC: 32.2 g/dL (ref 30.0–36.0)
MCV: 113.9 fL — ABNORMAL HIGH (ref 80.0–100.0)
Platelets: 234 10*3/uL (ref 150–400)
RBC: 2.02 MIL/uL — ABNORMAL LOW (ref 4.22–5.81)
WBC: 10.5 10*3/uL (ref 4.0–10.5)
nRBC: 1 % — ABNORMAL HIGH (ref 0.0–0.2)

## 2023-03-25 MED ORDER — ALUM & MAG HYDROXIDE-SIMETH 200-200-20 MG/5ML PO SUSP
30.0000 mL | Freq: Four times a day (QID) | ORAL | Status: DC | PRN
  Filled 2023-03-25: qty 30

## 2023-03-25 NOTE — Hospital Course (Signed)
 Patient was admitted and treated for sickle cell pain crisis.  Was on Dilaudid  PCA and IV fluids.  Has chronic kidney disease stage IV so no Toradol .  Patient's hemoglobin continued to drop.  Ultimately was transfused 2 units of packed red blood cells total.  Hemoglobin went to 7.2.  At the time of discharge he was hemodynamically stable.  His renal function also got worse initially with creatinine above 3.  At time of discharge creatinine was down to 2.16.  Patient appears to be stable at and will follow-up with PCP.  He has all his prescriptions.

## 2023-03-25 NOTE — Plan of Care (Signed)
  Problem: Education: Goal: Knowledge of vaso-occlusive preventative measures will improve Outcome: Adequate for Discharge Goal: Awareness of infection prevention will improve Outcome: Adequate for Discharge Goal: Awareness of signs and symptoms of anemia will improve Outcome: Adequate for Discharge Goal: Long-term complications will improve Outcome: Adequate for Discharge   Problem: Self-Care: Goal: Ability to incorporate actions that prevent/reduce pain crisis will improve Outcome: Adequate for Discharge   Problem: Bowel/Gastric: Goal: Gut motility will be maintained Outcome: Adequate for Discharge   Problem: Tissue Perfusion: Goal: Complications related to inadequate tissue perfusion will be avoided or minimized Outcome: Adequate for Discharge   Problem: Respiratory: Goal: Pulmonary complications will be avoided or minimized Outcome: Adequate for Discharge Goal: Acute Chest Syndrome will be identified early to prevent complications Outcome: Adequate for Discharge   Problem: Fluid Volume: Goal: Ability to maintain a balanced intake and output will improve Outcome: Adequate for Discharge   Problem: Sensory: Goal: Pain level will decrease with appropriate interventions Outcome: Adequate for Discharge   Problem: Health Behavior: Goal: Postive changes in compliance with treatment and prescription regimens will improve Outcome: Adequate for Discharge   Problem: Education: Goal: Knowledge of General Education information will improve Description: Including pain rating scale, medication(s)/side effects and non-pharmacologic comfort measures Outcome: Adequate for Discharge   Problem: Health Behavior/Discharge Planning: Goal: Ability to manage health-related needs will improve Outcome: Adequate for Discharge   Problem: Clinical Measurements: Goal: Ability to maintain clinical measurements within normal limits will improve Outcome: Adequate for Discharge Goal: Will remain  free from infection Outcome: Adequate for Discharge Goal: Diagnostic test results will improve Outcome: Adequate for Discharge Goal: Respiratory complications will improve Outcome: Adequate for Discharge Goal: Cardiovascular complication will be avoided Outcome: Adequate for Discharge   Problem: Activity: Goal: Risk for activity intolerance will decrease Outcome: Adequate for Discharge   Problem: Nutrition: Goal: Adequate nutrition will be maintained Outcome: Adequate for Discharge   Problem: Coping: Goal: Level of anxiety will decrease Outcome: Adequate for Discharge   Problem: Elimination: Goal: Will not experience complications related to bowel motility Outcome: Adequate for Discharge Goal: Will not experience complications related to urinary retention Outcome: Adequate for Discharge   Problem: Pain Managment: Goal: General experience of comfort will improve and/or be controlled Outcome: Adequate for Discharge   Problem: Safety: Goal: Ability to remain free from injury will improve Outcome: Adequate for Discharge   Problem: Skin Integrity: Goal: Risk for impaired skin integrity will decrease Outcome: Adequate for Discharge

## 2023-03-25 NOTE — Progress Notes (Signed)
 Reviewed discharge paperwork, medication regimen, and follow up appointments. Advised no changes made to medications. Patient given the opportunity to ask questions. He was discharged via wheelchair by NT to main entrance.

## 2023-03-25 NOTE — Discharge Summary (Signed)
 Physician Discharge Summary   Patient: [name] [name]. MRN: [id number] DOB: 10-13-1980  Admit date:     03/22/2023  Discharge date: 03/25/2023  Discharge Physician: SIM KNOLL   PCP: [name], NP   Recommendations at discharge:    Follow up with PCP and resume care  Discharge Diagnoses: Principal Problem:   Sickle cell anemia with pain (HCC) Active Problems:   Hyperkalemia   Leukocytosis   Abnormal liver function   Chronic pain syndrome   Marijuana use   Nicotine  dependence, cigarettes, uncomplicated   Sickle cell anemia with crisis (HCC)  Resolved Problems:   * No resolved hospital problems. [facility] Course: Patient was admitted and treated for sickle cell pain crisis.  Was on Dilaudid  PCA and IV fluids.  Has chronic kidney disease stage IV so no Toradol .  Patient's hemoglobin continued to drop.  Ultimately was transfused 2 units of packed red blood cells total.  Hemoglobin went to 7.2.  At the time of discharge he was hemodynamically stable.  His renal function also got worse initially with creatinine above 3.  At time of discharge creatinine was down to 2.16.  Patient appears to be stable at and will follow-up with PCP.  He has all his prescriptions.  Assessment and Plan: No notes have been filed under this hospital service. Service: Hospitalist        Consultants: None Procedures performed: CXR,   Disposition: Home Diet recommendation:  Discharge Diet Orders (From admission, onward)     Start     Ordered   03/25/23 0000  Diet - low sodium heart healthy        03/25/23 1205           Regular diet DISCHARGE MEDICATION: Allergies as of 03/25/2023       Reactions   Nsaids Other (See Comments)   Kidney disease   Morphine  And Codeine  Other (See Comments)   Really bad headaches         Medication List     TAKE these medications    amitriptyline  25 MG tablet Commonly known as: ELAVIL  Take 2 tablets (50 mg total) by mouth at  bedtime. What changed: how much to take   calcitRIOL  0.25 MCG capsule Commonly known as: ROCALTROL  Take 0.25 mcg by mouth daily with breakfast.   Ferriprox  Twice-A-Day 1000 MG Tabs Generic drug: Deferiprone  (Twice Daily) Take 3,000 mg by mouth daily.   folic acid  1 MG tablet Commonly known as: FOLVITE  TAKE 1 TABLET BY MOUTH EVERY DAY What changed: when to take this   hydroxyurea  500 MG capsule Commonly known as: HYDREA  Take 1 capsule by mouth in the morning on Sun/Tues/Wed/Thurs/Sat and 2 capsules on Mon/Fri   methylPREDNISolone  4 MG Tbpk tablet Commonly known as: MEDROL  DOSEPAK Take by mouth as directed.   oxyCODONE  15 mg 12 hr tablet Commonly known as: OXYCONTIN  Take 1 tablet (15 mg) by mouth every 12 hours.   Oxycodone  HCl 10 MG Tabs Take 1 tablet (10 mg total) by mouth See admin instructions. Take 10 mg by mouth every 6 hours   sodium bicarbonate  650 MG tablet Take 1,950 mg by mouth with breakfast, with lunch, and with evening meal.   Tylenol  8 Hour Arthritis Pain 650 MG CR tablet Generic drug: acetaminophen  Take 650 mg by mouth every 8 (eight) hours as needed for pain.   Veltassa  16.8 g Pack Generic drug: Patiromer  Sorbitex Calcium  Take 16.8 g by mouth daily.  Discharge Exam: Filed Weights   03/22/23 2000  Weight: 52.2 kg   Constitutional: NAD, calm, comfortable Eyes: PERRL, lids and conjunctivae normal ENMT: Mucous membranes are moist. Posterior pharynx clear of any exudate or lesions.Normal dentition.  Neck: normal, supple, no masses, no thyromegaly Respiratory: clear to auscultation bilaterally, no wheezing, no crackles. Normal respiratory effort. No accessory muscle use.  Cardiovascular: Regular rate and rhythm, no murmurs / rubs / gallops. No extremity edema. 2+ pedal pulses. No carotid bruits.  Abdomen: no tenderness, no masses palpated. No hepatosplenomegaly. Bowel sounds positive.  Musculoskeletal: Good range of motion, no joint swelling or  tenderness, Skin: no rashes, lesions, ulcers. No induration Neurologic: CN 2-12 grossly intact. Sensation intact, DTR normal. Strength 5/5 in all 4.  Psychiatric: Normal judgment and insight. Alert and oriented x 3. Normal mood   Condition at discharge: good  The results of significant diagnostics from this hospitalization (including imaging, microbiology, ancillary and laboratory) are listed below for reference.   Imaging Studies: No results found.  Microbiology: Results for orders placed or performed during the hospital encounter of 01/27/23  Blood culture (routine x 2)     Status: None   Collection Time: 01/27/23 10:35 AM   Specimen: BLOOD  Result Value Ref Range Status   Specimen Description   Final    BLOOD RIGHT ANTECUBITAL Performed at [facility], [address], [place], [place] [address]    Special Requests   Final    BOTTLES DRAWN AEROBIC AND ANAEROBIC Blood Culture adequate volume Performed at [facility], [address] [place], [place] [address]    Culture   Final    NO GROWTH 5 DAYS Performed at [facility], [address], [place], [place] [address]    Report Status 02/01/2023 FINAL  Final  Blood culture (routine x 2)     Status: None   Collection Time: 01/27/23 10:35 AM   Specimen: BLOOD  Result Value Ref Range Status   Specimen Description   Final    BLOOD LEFT ANTECUBITAL Performed at [facility], [address], [place], [place] [address]    Special Requests   Final    BOTTLES DRAWN AEROBIC AND ANAEROBIC Blood Culture adequate volume Performed at [facility], [address], [place], [place] [address]    Culture   Final    NO GROWTH 5 DAYS Performed at [facility], [address], [place], [place] [address]    Report Status 02/01/2023 FINAL  Final   *Note: Due to a large number of results and/or encounters for the requested time period, some results have  not been displayed. A complete set of results can be found in Results Review.    Labs: CBC: Recent Labs  Lab 03/22/23 1145 03/23/23 0630 03/25/23 0805  WBC 5.9 10.0 10.5  NEUTROABS 3.6  --   --   HGB 6.9* 6.1* 7.4*  HCT 19.8* 17.2* 23.0*  MCV 120.0* 117.0* 113.9*  PLT 222 214 234   Basic Metabolic Panel: Recent Labs  Lab 03/22/23 1145 03/23/23 0630 03/24/23 1125 03/25/23 0805  NA 132* 133* 138 136  K 5.8* 4.6 5.3* 5.3*  CL 102 105 112* 112*  CO2 20* 19* 20* 18*  GLUCOSE 91 102* 109* 98  BUN 49* 46* 34* 30*  CREATININE 3.70* 3.52* 2.61* 2.16*  CALCIUM  8.6* 8.5* 8.5* 9.0   Liver Function Tests: Recent Labs  Lab 03/22/23 1145 03/23/23 0630 03/24/23 1125  03/25/23 0805  AST 129* 81* 47* 38  ALT 96* 70* 49* 40  ALKPHOS 202* 178* 162* 166*  BILITOT 1.7* 1.5* 1.4* 1.2  PROT 6.8 6.2* 6.4* 6.5  ALBUMIN  3.3* 3.0* 3.0* 3.1*   CBG: No results for input(s): GLUCAP in the last 168 hours.  Discharge time spent: greater than 30 minutes.  SignedBETHA SIM KNOLL, MD [facility] 03/25/2023

## 2023-03-26 LAB — BPAM RBC
Blood Product Expiration Date: 202503142359
ISSUE DATE / TIME: 202502071416
Unit Type and Rh: 5100

## 2023-03-26 LAB — TYPE AND SCREEN
ABO/RH(D): O POS
Antibody Screen: NEGATIVE
Unit division: 0

## 2023-03-27 ENCOUNTER — Other Ambulatory Visit: Payer: Self-pay | Admitting: Nurse Practitioner

## 2023-03-27 DIAGNOSIS — G894 Chronic pain syndrome: Secondary | ICD-10-CM

## 2023-03-27 DIAGNOSIS — D571 Sickle-cell disease without crisis: Secondary | ICD-10-CM

## 2023-03-27 NOTE — Telephone Encounter (Signed)
Copied from CRM [id number]. Topic: Clinical - Medication Refill >> Mar 27, 2023 10:22 AM Shelah Lewandowsky wrote: Most Recent Primary Care Visit:  Provider: Recardo Evangelist A  Department: [facility]  Visit Type: MEDICARE AWV, SEQUENTIAL  Date: 02/22/2023  Medication: oxyCODONE (OXYCONTIN) 15 mg 12 hr tablet Oxycodone HCl 10 MG TABS  Has the patient contacted their pharmacy? No (Agent: If no, request that the patient contact the pharmacy for the refill. If patient does not wish to contact the pharmacy document the reason why and proceed with request.) (Agent: If yes, when and what did the pharmacy advise?)  Is this the correct pharmacy for this prescription? Yes If no, delete pharmacy and type the correct one.  This is the patient's preferred pharmacy:  [facility] [address] [place] - [address] [place] [address] Phone: [phone] Fax: [phone]   Has the prescription been filled recently? Yes  Is the patient out of the medication? No  Has the patient been seen for an appointment in the last year OR does the patient have an upcoming appointment? Yes  Can we respond through MyChart? Yes  Agent: Please be advised that Rx refills may take up to 3 business days. We ask that you follow-up with your pharmacy.

## 2023-03-28 ENCOUNTER — Telehealth: Payer: Self-pay

## 2023-03-28 MED ORDER — OXYCODONE HCL ER 15 MG PO T12A
15.0000 mg | EXTENDED_RELEASE_TABLET | Freq: Two times a day (BID) | ORAL | 0 refills | Status: DC
Start: 2023-03-29 — End: 2023-04-24

## 2023-03-28 MED ORDER — OXYCODONE HCL 10 MG PO TABS
10.0000 mg | ORAL_TABLET | ORAL | 0 refills | Status: DC
Start: 2023-03-28 — End: 2023-04-17

## 2023-03-28 NOTE — Transitions of Care (Post Inpatient/ED Visit) (Signed)
   03/28/2023  Name: [name] MRN: [id number] DOB: 10-19-1980  Today's TOC FU Call Status: Today's TOC FU Call Status:: Unsuccessful Call (1st Attempt) Unsuccessful Call (1st Attempt) Date: 03/28/23  Attempted to reach the patient regarding the most recent Inpatient/ED visit.  Unable to reach patient, no answer, VM box not set up.   Follow Up Plan: Additional outreach attempts will be made to reach the patient to complete the Transitions of Care (Post Inpatient/ED visit) call.    Gabriel Cirri MSN, RN RN Case [employer] executive [facility]  [facility] Office Hours Wed/Thur  8:00 am-6:00 pm Direct Dial: [phone] Main Phone [phone]  Fax: [phone] [email or web address]

## 2023-04-17 ENCOUNTER — Other Ambulatory Visit: Payer: Self-pay | Admitting: Nurse Practitioner

## 2023-04-17 DIAGNOSIS — G894 Chronic pain syndrome: Secondary | ICD-10-CM

## 2023-04-17 DIAGNOSIS — D571 Sickle-cell disease without crisis: Secondary | ICD-10-CM

## 2023-04-17 NOTE — Telephone Encounter (Signed)
 Last Fill: 03/28/23 60 tabs/0 RF  Last OV: 01/25/23 Next OV: 04/26/23  Routing to provider for review/authorization.

## 2023-04-17 NOTE — Telephone Encounter (Signed)
 Copied from CRM [id number]. Topic: Clinical - Medication Refill >> Apr 17, 2023  8:57 AM Elle L wrote: Most Recent Primary Care Visit:  Provider: Recardo Evangelist A  Department: [facility]  Visit Type: MEDICARE AWV, SEQUENTIAL  Date: 02/22/2023  Medication: Oxycodone HCl 10 MG TABS  Has the patient contacted their pharmacy? Yes (Agent: If no, request that the patient contact the pharmacy for the refill. If patient does not wish to contact the pharmacy document the reason why and proceed with request.) (Agent: If yes, when and what did the pharmacy advise?)  Is this the correct pharmacy for this prescription? Yes  This is the patient's preferred pharmacy:  [facility] [address], [place] - [address] [place] [address] Phone: [phone] Fax: [phone]  Has the prescription been filled recently? No  Is the patient out of the medication? Yes  Has the patient been seen for an appointment in the last year OR does the patient have an upcoming appointment? Yes  Can we respond through MyChart? Yes  Agent: Please be advised that Rx refills may take up to 3 business days. We ask that you follow-up with your pharmacy.

## 2023-04-17 NOTE — Telephone Encounter (Signed)
 Please advise [facility]

## 2023-04-18 MED ORDER — OXYCODONE HCL 10 MG PO TABS
10.0000 mg | ORAL_TABLET | ORAL | 0 refills | Status: DC
Start: 2023-04-18 — End: 2023-05-08

## 2023-04-24 ENCOUNTER — Other Ambulatory Visit: Payer: Self-pay

## 2023-04-24 DIAGNOSIS — D571 Sickle-cell disease without crisis: Secondary | ICD-10-CM

## 2023-04-24 NOTE — Telephone Encounter (Signed)
 Patient called to follow up on request. Advised request routed to provider and awaiting approval. Patient verbalized understanding.

## 2023-04-24 NOTE — Telephone Encounter (Signed)
 Please advise [facility]

## 2023-04-25 MED ORDER — OXYCODONE HCL ER 15 MG PO T12A: 15.0000 mg | EXTENDED_RELEASE_TABLET | Freq: Two times a day (BID) | ORAL | 0 refills | Status: DC

## 2023-04-26 ENCOUNTER — Ambulatory Visit (INDEPENDENT_AMBULATORY_CARE_PROVIDER_SITE_OTHER): Payer: Self-pay | Admitting: Nurse Practitioner

## 2023-04-26 ENCOUNTER — Encounter: Payer: Self-pay | Admitting: Nurse Practitioner

## 2023-04-26 VITALS — BP 111/73 | HR 103 | Temp 97.6°F | Wt 124.6 lb

## 2023-04-26 DIAGNOSIS — D5700 Hb-SS disease with crisis, unspecified: Secondary | ICD-10-CM

## 2023-04-26 DIAGNOSIS — F1190 Opioid use, unspecified, uncomplicated: Secondary | ICD-10-CM

## 2023-04-26 NOTE — Patient Instructions (Signed)
 1. Sickle cell disease with crisis (HCC) (Primary)  - Sickle Cell Panel  2. Opioid use  - ToxAssure Flex 15, Ur

## 2023-04-26 NOTE — Progress Notes (Signed)
 Subjective   Patient ID: [name] Pick., male    DOB: 1980/06/15, 43 y.o.   MRN: [id number]  Chief Complaint  Patient presents with   Hospitalization Follow-up    Referring provider: Massie Maroon, FNP  [name] Pick. is a 43 y.o. male with Past Medical History: 09/24/2018: Abscess of right iliac fossa     Comment:  required Perc Drain 09/24/2018 No date: Arachnoid Cyst of brain bilaterally     Comment:  "2 really small ones in the back of my head; inside; saw              them w/MRI" (09/25/2012) ~ 2012: Bacterial pneumonia     Comment:  "caught it here in the hospital" (09/25/2012) No date: Chronic kidney disease     Comment:  "from my sickle cell" (09/25/2012) No date: CKD (chronic kidney disease), stage II 04/19/2017: Colitis     Comment:  CT scan abd/ pelvis No date: GERD (gastroesophageal reflux disease)     Comment:  "after I eat alot of spicey foods" (09/25/2012) 07/10/2012: Gynecomastia, male No date: History of blood transfusion     Comment:  "always related to sickle cell crisis" (09/25/2012) 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 States that he feels much better today. He does need follow up labs. Denies f/c/s, n/v/d, hemoptysis, PND, leg swelling Denies chest pain or edema    Allergies  Allergen Reactions   Nsaids Other (See Comments)    Kidney disease   Morphine And Codeine Other (See Comments)    "Really bad  headaches"     Immunization History  Administered Date(s) Administered   Influenza Split 12/08/2011   Influenza,inj,Quad PF,6+ Mos 10/11/2012, 12/13/2016, 11/15/2017, 01/06/2019, 01/28/2019, 01/13/2020, 12/15/2020, 01/24/2022   Influenza-Unspecified 10/11/2012, 12/13/2016   Moderna Sars-Covid-2 Vaccination 05/29/2019, 06/26/2019   Pneumococcal Conjugate-13 08/23/2017   Pneumococcal Polysaccharide-23 12/13/2016   Td 09/18/2004   Tdap 06/13/2017    Tobacco History: Social History   Tobacco Use  Smoking Status Former   Current packs/day: 0.10   Types: Cigarettes  Smokeless Tobacco Never   Counseling given: Not Answered   Outpatient Encounter Medications as of 04/26/2023  Medication Sig   amitriptyline (ELAVIL) 25 MG tablet Take 2 tablets (50 mg total) by mouth at bedtime. (Patient taking differently: Take 75 mg by mouth at bedtime.)   calcitRIOL (ROCALTROL) 0.25 MCG capsule Take 0.25 mcg by mouth daily with breakfast.   Deferiprone, Twice Daily, (FERRIPROX TWICE-A-DAY) 1000 MG TABS Take 3,000 mg by mouth daily.   folic acid (FOLVITE) 1 MG tablet TAKE 1 TABLET BY MOUTH EVERY DAY (Patient taking differently: Take 1 mg by mouth every morning.)   hydroxyurea (HYDREA) 500 MG capsule Take 1 capsule by mouth in the morning on Sun/Tues/Wed/Thurs/Sat and 2 capsules on Mon/Fri   methylPREDNISolone (MEDROL DOSEPAK) 4 MG TBPK tablet Take by mouth as directed.   oxyCODONE (OXYCONTIN) 15 mg  12 hr tablet Take 1 tablet (15 mg) by mouth every 12 hours.   Oxycodone HCl 10 MG TABS Take 1 tablet (10 mg total) by mouth See admin instructions. Take 10 mg by mouth every 6 hours   sodium bicarbonate 650 MG tablet Take 1,950 mg by mouth with breakfast, with lunch, and with evening meal.   TYLENOL 8 HOUR ARTHRITIS PAIN 650 MG CR tablet Take 650 mg by mouth every 8 (eight) hours as needed for pain.   VELTASSA 16.8 g PACK Take 16.8 g by mouth daily.   No facility-administered encounter medications on file  as of 04/26/2023.    Review of Systems  Review of Systems  Constitutional: Negative.   HENT: Negative.    Cardiovascular: Negative.   Gastrointestinal: Negative.   Allergic/Immunologic: Negative.   Neurological: Negative.   Psychiatric/Behavioral: Negative.       Objective:   BP 111/73   Pulse (!) 103   Temp 97.6 F (36.4 C) (Oral)   Wt 124 lb 9.6 oz (56.5 kg)   SpO2 100%   BMI 22.07 kg/m   Wt Readings from Last 5 Encounters:  04/26/23 124 lb 9.6 oz (56.5 kg)  03/22/23 115 lb 1.3 oz (52.2 kg)  02/22/23 125 lb (56.7 kg)  01/27/23 126 lb (57.2 kg)  01/25/23 125 lb 9.6 oz (57 kg)     Physical Exam Vitals and nursing note reviewed.  Constitutional:      General: He is not in acute distress.    Appearance: He is well-developed.  Cardiovascular:     Rate and Rhythm: Normal rate and regular rhythm.  Pulmonary:     Effort: Pulmonary effort is normal.     Breath sounds: Normal breath sounds.  Skin:    General: Skin is warm and dry.  Neurological:     Mental Status: He is alert and oriented to person, place, and time.       Assessment & Plan:   Sickle cell disease with crisis (HCC) -     Sickle Cell Panel  Opioid use -     ToxAssure Flex 15, Ur     Return in about 3 months (around 07/27/2023).   Ivonne Andrew, NP 04/26/2023

## 2023-04-27 ENCOUNTER — Telehealth: Payer: Self-pay

## 2023-04-27 ENCOUNTER — Observation Stay (HOSPITAL_COMMUNITY)
Admission: EM | Admit: 2023-04-27 | Discharge: 2023-04-28 | Disposition: A | Discharge status: Home / Self Care | Attending: Internal Medicine | Admitting: Internal Medicine

## 2023-04-27 ENCOUNTER — Encounter (HOSPITAL_COMMUNITY): Payer: Self-pay

## 2023-04-27 ENCOUNTER — Other Ambulatory Visit: Payer: Self-pay

## 2023-04-27 DIAGNOSIS — F1090 Alcohol use, unspecified, uncomplicated: Secondary | ICD-10-CM | POA: Insufficient documentation

## 2023-04-27 DIAGNOSIS — R0602 Shortness of breath: Secondary | ICD-10-CM | POA: Diagnosis present

## 2023-04-27 DIAGNOSIS — N184 Chronic kidney disease, stage 4 (severe): Secondary | ICD-10-CM | POA: Insufficient documentation

## 2023-04-27 DIAGNOSIS — F1290 Cannabis use, unspecified, uncomplicated: Secondary | ICD-10-CM | POA: Diagnosis not present

## 2023-04-27 DIAGNOSIS — Z87891 Personal history of nicotine dependence: Secondary | ICD-10-CM | POA: Diagnosis not present

## 2023-04-27 DIAGNOSIS — D5700 Hb-SS disease with crisis, unspecified: Principal | ICD-10-CM | POA: Diagnosis present

## 2023-04-27 DIAGNOSIS — G894 Chronic pain syndrome: Secondary | ICD-10-CM | POA: Diagnosis not present

## 2023-04-27 DIAGNOSIS — D631 Anemia in chronic kidney disease: Secondary | ICD-10-CM

## 2023-04-27 DIAGNOSIS — N179 Acute kidney failure, unspecified: Secondary | ICD-10-CM | POA: Insufficient documentation

## 2023-04-27 LAB — CBC WITH DIFFERENTIAL/PLATELET
Abs Immature Granulocytes: 0.03 10*3/uL (ref 0.00–0.07)
Basophils Absolute: 0 10*3/uL (ref 0.0–0.1)
Basophils Relative: 0 %
Eosinophils Absolute: 0.1 10*3/uL (ref 0.0–0.5)
Eosinophils Relative: 1 %
HCT: 19.3 % — ABNORMAL LOW (ref 39.0–52.0)
Hemoglobin: 6.9 g/dL — CL (ref 13.0–17.0)
Immature Granulocytes: 0 %
Lymphocytes Relative: 51 %
Lymphs Abs: 5.6 10*3/uL — ABNORMAL HIGH (ref 0.7–4.0)
MCH: 41.6 pg — ABNORMAL HIGH (ref 26.0–34.0)
MCHC: 35.8 g/dL (ref 30.0–36.0)
MCV: 116.3 fL — ABNORMAL HIGH (ref 80.0–100.0)
Monocytes Absolute: 0.6 10*3/uL (ref 0.1–1.0)
Monocytes Relative: 5 %
Neutro Abs: 4.7 10*3/uL (ref 1.7–7.7)
Neutrophils Relative %: 43 %
Platelets: 233 10*3/uL (ref 150–400)
RBC: 1.66 MIL/uL — ABNORMAL LOW (ref 4.22–5.81)
WBC: 11.1 10*3/uL — ABNORMAL HIGH (ref 4.0–10.5)
nRBC: 0.3 % — ABNORMAL HIGH (ref 0.0–0.2)

## 2023-04-27 LAB — CMP14+CBC/D/PLT+FER+RETIC+V...
ALT: 30 IU/L (ref 0–44)
AST: 40 IU/L (ref 0–40)
Albumin: 3.5 g/dL — ABNORMAL LOW (ref 4.1–5.1)
Alkaline Phosphatase: 240 IU/L — ABNORMAL HIGH (ref 44–121)
BUN/Creatinine Ratio: 13 (ref 9–20)
BUN: 41 mg/dL — ABNORMAL HIGH (ref 6–24)
Basophils Absolute: 0 10*3/uL (ref 0.0–0.2)
Basos: 0 %
Bilirubin Total: 0.9 mg/dL (ref 0.0–1.2)
CO2: 24 mmol/L (ref 20–29)
Calcium: 8.1 mg/dL — ABNORMAL LOW (ref 8.7–10.2)
Chloride: 101 mmol/L (ref 96–106)
Creatinine, Ser: 3.1 mg/dL — ABNORMAL HIGH (ref 0.76–1.27)
EOS (ABSOLUTE): 0.2 10*3/uL (ref 0.0–0.4)
Eos: 2 %
Ferritin: 3764 ng/mL — ABNORMAL HIGH (ref 30–400)
Globulin, Total: 2.6 g/dL (ref 1.5–4.5)
Glucose: 89 mg/dL (ref 70–99)
Hematocrit: 19.1 % — ABNORMAL LOW (ref 37.5–51.0)
Hemoglobin: 6.3 g/dL — CL (ref 13.0–17.7)
Immature Grans (Abs): 0 10*3/uL (ref 0.0–0.1)
Immature Granulocytes: 0 %
Lymphocytes Absolute: 6 10*3/uL — ABNORMAL HIGH (ref 0.7–3.1)
Lymphs: 58 %
MCH: 39.4 pg — ABNORMAL HIGH (ref 26.6–33.0)
MCHC: 33 g/dL (ref 31.5–35.7)
MCV: 119 fL — ABNORMAL HIGH (ref 79–97)
Monocytes Absolute: 0.6 10*3/uL (ref 0.1–0.9)
Monocytes: 6 %
Neutrophils Absolute: 3.5 10*3/uL (ref 1.4–7.0)
Neutrophils: 34 %
Platelets: 199 10*3/uL (ref 150–450)
Potassium: 3.9 mmol/L (ref 3.5–5.2)
RBC: 1.6 x10E6/uL — CL (ref 4.14–5.80)
Retic Ct Pct: 5.6 % — ABNORMAL HIGH (ref 0.6–2.6)
Sodium: 138 mmol/L (ref 134–144)
Total Protein: 6.1 g/dL (ref 6.0–8.5)
Vit D, 25-Hydroxy: 11.6 ng/mL — ABNORMAL LOW (ref 30.0–100.0)
WBC: 10.3 10*3/uL (ref 3.4–10.8)
eGFR: 25 mL/min/{1.73_m2} — ABNORMAL LOW (ref >59)

## 2023-04-27 LAB — COMPREHENSIVE METABOLIC PANEL
ALT: 30 U/L (ref 0–44)
AST: 40 U/L (ref 15–41)
Albumin: 3 g/dL — ABNORMAL LOW (ref 3.5–5.0)
Alkaline Phosphatase: 199 U/L — ABNORMAL HIGH (ref 38–126)
Anion gap: 11 (ref 5–15)
BUN: 43 mg/dL — ABNORMAL HIGH (ref 6–20)
CO2: 27 mmol/L (ref 22–32)
Calcium: 8.4 mg/dL — ABNORMAL LOW (ref 8.9–10.3)
Chloride: 99 mmol/L (ref 98–111)
Creatinine, Ser: 2.64 mg/dL — ABNORMAL HIGH (ref 0.61–1.24)
GFR, Estimated: 30 mL/min — ABNORMAL LOW (ref >60)
Glucose, Bld: 137 mg/dL — ABNORMAL HIGH (ref 70–99)
Potassium: 3.4 mmol/L — ABNORMAL LOW (ref 3.5–5.1)
Sodium: 137 mmol/L (ref 135–145)
Total Bilirubin: 1.4 mg/dL — ABNORMAL HIGH (ref 0.0–1.2)
Total Protein: 6.9 g/dL (ref 6.5–8.1)

## 2023-04-27 LAB — RETICULOCYTES
Immature Retic Fract: 34.6 % — ABNORMAL HIGH (ref 2.3–15.9)
RBC.: 1.66 MIL/uL — ABNORMAL LOW (ref 4.22–5.81)
Retic Count, Absolute: 93.5 10*3/uL (ref 19.0–186.0)
Retic Ct Pct: 5.6 % — ABNORMAL HIGH (ref 0.4–3.1)

## 2023-04-27 MED ORDER — OXYCODONE HCL 5 MG PO TABS: 10.0000 mg | ORAL_TABLET | Freq: Four times a day (QID) | ORAL | Status: DC | PRN

## 2023-04-27 MED ORDER — HYDROMORPHONE 1 MG/ML IV SOLN
INTRAVENOUS | Status: DC
  Administered 2023-04-28: 30 mg via INTRAVENOUS
  Filled 2023-04-27: qty 30

## 2023-04-27 MED ORDER — FOLIC ACID 1 MG PO TABS
1.0000 mg | ORAL_TABLET | Freq: Every day | ORAL | Status: DC
  Administered 2023-04-28: 1 mg via ORAL
  Filled 2023-04-27: qty 1

## 2023-04-27 MED ORDER — ENOXAPARIN SODIUM 30 MG/0.3ML IJ SOSY
30.0000 mg | PREFILLED_SYRINGE | INTRAMUSCULAR | Status: DC
  Filled 2023-04-27: qty 0.3

## 2023-04-27 MED ORDER — ONDANSETRON HCL 4 MG/2ML IJ SOLN: 4.0000 mg | Freq: Four times a day (QID) | INTRAMUSCULAR | Status: DC | PRN

## 2023-04-27 MED ORDER — OXYCODONE HCL ER 15 MG PO T12A
15.0000 mg | EXTENDED_RELEASE_TABLET | Freq: Two times a day (BID) | ORAL | Status: DC
  Administered 2023-04-27 – 2023-04-28 (×2): 15 mg via ORAL
  Filled 2023-04-27 (×2): qty 1

## 2023-04-27 MED ORDER — POLYETHYLENE GLYCOL 3350 17 G PO PACK: 17.0000 g | PACK | Freq: Every day | ORAL | Status: DC | PRN

## 2023-04-27 MED ORDER — HYDROXYUREA 500 MG PO CAPS
500.0000 mg | ORAL_CAPSULE | ORAL | Status: DC
  Administered 2023-04-28: 500 mg via ORAL
  Filled 2023-04-27: qty 1

## 2023-04-27 MED ORDER — ACETAMINOPHEN 325 MG PO TABS: 650.0000 mg | ORAL_TABLET | Freq: Three times a day (TID) | ORAL | Status: DC | PRN

## 2023-04-27 MED ORDER — ONDANSETRON HCL 4 MG/2ML IJ SOLN
4.0000 mg | INTRAMUSCULAR | Status: DC | PRN
  Administered 2023-04-27: 4 mg via INTRAVENOUS
  Filled 2023-04-27: qty 2

## 2023-04-27 MED ORDER — HYDROMORPHONE HCL 1 MG/ML IJ SOLN
1.0000 mg | Freq: Once | INTRAMUSCULAR | Status: AC
  Administered 2023-04-27: 1 mg via INTRAVENOUS
  Filled 2023-04-27: qty 1

## 2023-04-27 MED ORDER — DIPHENHYDRAMINE HCL 25 MG PO CAPS: 25.0000 mg | ORAL_CAPSULE | ORAL | Status: DC | PRN

## 2023-04-27 MED ORDER — SODIUM CHLORIDE 0.9% FLUSH: 9.0000 mL | INTRAVENOUS | Status: DC | PRN

## 2023-04-27 MED ORDER — HYDROMORPHONE HCL 1 MG/ML IJ SOLN
1.0000 mg | INTRAMUSCULAR | Status: AC
  Administered 2023-04-27: 1 mg via INTRAVENOUS
  Filled 2023-04-27: qty 1

## 2023-04-27 MED ORDER — HYDROMORPHONE HCL 1 MG/ML IJ SOLN: 1.0000 mg | INTRAMUSCULAR | Status: DC

## 2023-04-27 MED ORDER — SODIUM CHLORIDE 0.9 % IV SOLN
12.5000 mg | Freq: Once | INTRAVENOUS | Status: AC
  Administered 2023-04-27: 12.5 mg via INTRAVENOUS
  Filled 2023-04-27: qty 12.5

## 2023-04-27 MED ORDER — NALOXONE HCL 0.4 MG/ML IJ SOLN: 0.4000 mg | INTRAMUSCULAR | Status: DC | PRN

## 2023-04-27 MED ORDER — SODIUM BICARBONATE 650 MG PO TABS
1950.0000 mg | ORAL_TABLET | Freq: Three times a day (TID) | ORAL | Status: DC
  Administered 2023-04-28 (×3): 1950 mg via ORAL
  Filled 2023-04-27 (×4): qty 3

## 2023-04-27 MED ORDER — AMITRIPTYLINE HCL 25 MG PO TABS
75.0000 mg | ORAL_TABLET | Freq: Every day | ORAL | Status: DC
  Administered 2023-04-27: 75 mg via ORAL
  Filled 2023-04-27: qty 3

## 2023-04-27 MED ORDER — HYDROXYUREA 500 MG PO CAPS: 1000.0000 mg | ORAL_CAPSULE | ORAL | Status: DC

## 2023-04-27 MED ORDER — DEXTROSE-SODIUM CHLORIDE 5-0.45 % IV SOLN: INTRAVENOUS | Status: DC

## 2023-04-27 MED ORDER — SENNOSIDES-DOCUSATE SODIUM 8.6-50 MG PO TABS
1.0000 | ORAL_TABLET | Freq: Two times a day (BID) | ORAL | Status: DC
  Filled 2023-04-27 (×2): qty 1

## 2023-04-27 NOTE — ED Provider Triage Note (Signed)
 Emergency Medicine Provider Triage Evaluation Note  [name] , a 43 y.o. male  was evaluated in triage.  Pt complains of bilateral leg pain starting today, was called by PCP today and told to come to the ED due to Hgb of 6.7 w/ accompanying leg pain. Has taken 10mg  oxycodone today + tylenol.   Endorses fatigue, intermittent shortness of breath on exertion Denies fevers, abdominal pain, n/v/d  Review of Systems  Positive: N/a Negative: N/a  Physical Exam  BP 126/85   Pulse (!) 101   Temp 98.4 F (36.9 C) (Oral)   Resp 19   SpO2 97%  Gen:   Awake, no distress   Resp:  Normal effort  MSK:   Moves extremities without difficulty  Other:    Medical Decision Making  Medically screening exam initiated at 2:03 PM.  Appropriate orders placed.  [name] was informed that the remainder of the evaluation will be completed by another provider, this initial triage assessment does not replace that evaluation, and the importance of remaining in the ED until their evaluation is complete.     Lunette Stands, [place] 04/27/23 1406

## 2023-04-27 NOTE — ED Triage Notes (Signed)
 Pt went to [facility] clinic yesterday- was called today and told to come to the ED due to hemoglobin being critically low.

## 2023-04-27 NOTE — ED Provider Notes (Signed)
 [facility] Provider Note   CSN: [id number] Arrival date & time: 04/27/23  1342    History  Chief Complaint  Patient presents with   Sickle Cell Pain Crisis    [name] [name]. is a 43 y.o. male history of sickle cell here for evaluation of abnormal lab and lower extremity pain.  Patient states he was seen by his PCP yesterday and had routine blood work drawn.  He noted this morning when he got up he had pain to his bilateral lower extremities.  He states this feels consistent with his prior sickle cell pain.  With his home pain medicine.  He subsequently received a call from his PCP did say he had a critically low hemoglobin level.  He states he has needed transfusion previously.  No lightheadedness, dizziness, fever, chest pain, shortness of breath or abdominal pain.  No epistaxis, melena, blood per rectum, hemoptysis.  No anticoagulation  HPI     Home Medications Prior to Admission medications   Medication Sig Start Date End Date Taking? Authorizing Provider  amitriptyline (ELAVIL) 25 MG tablet Take 2 tablets (50 mg total) by mouth at bedtime. Patient taking differently: Take 75 mg by mouth at bedtime. 01/25/23  Yes Ivonne Andrew, NP  calcitRIOL (ROCALTROL) 0.25 MCG capsule Take 0.25 mcg by mouth daily with breakfast. 11/16/20  Yes [provider]  Deferiprone, Twice Daily, (FERRIPROX TWICE-A-DAY) 1000 MG TABS Take 3,000 mg by mouth daily. 05/01/22  Yes Massie Maroon, FNP  folic acid (FOLVITE) 1 MG tablet TAKE 1 TABLET BY MOUTH EVERY DAY Patient taking differently: Take 1 mg by mouth every morning. 03/22/20  Yes Massie Maroon, FNP  hydroxyurea (HYDREA) 500 MG capsule Take 1 capsule by mouth in the morning on Sun/Tues/Wed/Thurs/Sat and 2 capsules on Mon/Fri 01/25/23  Yes Ivonne Andrew, NP  oxyCODONE (OXYCONTIN) 15 mg 12 hr tablet Take 1 tablet (15 mg) by mouth every 12 hours. 04/26/23  Yes Ivonne Andrew, NP   Oxycodone HCl 10 MG TABS Take 1 tablet (10 mg total) by mouth See admin instructions. Take 10 mg by mouth every 6 hours 04/18/23  Yes Ivonne Andrew, NP  sodium bicarbonate 650 MG tablet Take 1,950 mg by mouth with breakfast, with lunch, and with evening meal. 11/07/18  Yes [provider]  sodium zirconium cyclosilicate (LOKELMA) 10 g PACK packet Take 10 g by mouth daily. 04/21/23 04/22/24 Yes [provider]  TYLENOL 8 HOUR ARTHRITIS PAIN 650 MG CR tablet Take 650 mg by mouth every 8 (eight) hours as needed for pain.   Yes [provider]      Allergies    Nsaids and Morphine and codeine    Review of Systems   Review of Systems  Constitutional: Negative.   HENT: Negative.    Respiratory: Negative.    Cardiovascular: Negative.   Gastrointestinal: Negative.   Genitourinary: Negative.   Musculoskeletal:  Negative for gait problem, neck pain and neck stiffness.       Lower extremity pain  Skin: Negative.   Neurological: Negative.   All other systems reviewed and are negative.  Physical Exam Updated Vital Signs BP 112/77 (BP Location: Left Arm)   Pulse 99   Temp 98.4 F (36.9 C) (Oral)   Resp 17   SpO2 97%  Physical Exam Vitals and nursing note reviewed.  Constitutional:      General: He is not in acute distress.  Appearance: He is well-developed. He is not ill-appearing or diaphoretic.  HENT:     Head: Atraumatic.     Nose: Nose normal.     Mouth/Throat:     Mouth: Mucous membranes are moist.  Eyes:     Pupils: Pupils are equal, round, and reactive to light.  Cardiovascular:     Rate and Rhythm: Normal rate and regular rhythm.     Pulses: Normal pulses.     Heart sounds: Normal heart sounds.  Pulmonary:     Effort: Pulmonary effort is normal. No respiratory distress.     Breath sounds: Normal breath sounds.  Abdominal:     General: Bowel sounds are normal. There is no distension.     Palpations: Abdomen is soft.  Musculoskeletal:         General: No swelling, tenderness, deformity or signs of injury. Normal range of motion.     Cervical back: Normal range of motion and neck supple.     Right lower leg: No edema.     Left lower leg: No edema.  Skin:    General: Skin is warm and dry.  Neurological:     General: No focal deficit present.     Mental Status: He is alert and oriented to person, place, and time.    ED Results / Procedures / Treatments   Labs (all labs ordered are listed, but only abnormal results are displayed) Labs Reviewed  CBC WITH DIFFERENTIAL/PLATELET - Abnormal; Notable for the following components:      Result Value   WBC 11.1 (*)    RBC 1.66 (*)    Hemoglobin 6.9 (*)    HCT 19.3 (*)    MCV 116.3 (*)    MCH 41.6 (*)    nRBC 0.3 (*)    Lymphs Abs 5.6 (*)    All other components within normal limits  RETICULOCYTES - Abnormal; Notable for the following components:   Retic Ct Pct 5.6 (*)    RBC. 1.66 (*)    Immature Retic Fract 34.6 (*)    All other components within normal limits  COMPREHENSIVE METABOLIC PANEL - Abnormal; Notable for the following components:   Potassium 3.4 (*)    Glucose, Bld 137 (*)    BUN 43 (*)    Creatinine, Ser 2.64 (*)    Calcium 8.4 (*)    Albumin 3.0 (*)    Alkaline Phosphatase 199 (*)    Total Bilirubin 1.4 (*)    GFR, Estimated 30 (*)    All other components within normal limits  CBC  CBC  LACTATE DEHYDROGENASE  HIGH SENSITIVITY CRP  TYPE AND SCREEN    EKG EKG Interpretation Date/Time:  Friday April 27 2023 14:57:54 EDT Ventricular Rate:  95 PR Interval:  173 QRS Duration:  83 QT Interval:  349 QTC Calculation: 439 R Axis:   60  Text Interpretation: Sinus rhythm No significant change since last tracing Confirmed by Richardean Canal [phone]) on 04/27/2023 6:40:38 PM  Radiology No results found.  Procedures Procedures    Medications Ordered in ED Medications  oxyCODONE (OXYCONTIN) 12 hr tablet 15 mg (has no administration in time range)   oxyCODONE (Oxy IR/ROXICODONE) immediate release tablet 10 mg (has no administration in time range)  acetaminophen (TYLENOL) tablet 650 mg (has no administration in time range)  amitriptyline (ELAVIL) tablet 75 mg (has no administration in time range)  hydroxyurea (HYDREA) capsule 500 mg (has no administration in time range)  sodium bicarbonate tablet 1,950  mg (has no administration in time range)  folic acid (FOLVITE) tablet 1 mg (has no administration in time range)  senna-docusate (Senokot-S) tablet 1 tablet (has no administration in time range)  polyethylene glycol (MIRALAX / GLYCOLAX) packet 17 g (has no administration in time range)  enoxaparin (LOVENOX) injection 30 mg (has no administration in time range)  dextrose 5 % and 0.45 % NaCl infusion (has no administration in time range)  naloxone (NARCAN) injection 0.4 mg (has no administration in time range)    And  sodium chloride flush (NS) 0.9 % injection 9 mL (has no administration in time range)  ondansetron (ZOFRAN) injection 4 mg (has no administration in time range)  diphenhydrAMINE (BENADRYL) capsule 25 mg (has no administration in time range)  HYDROmorphone (DILAUDID) 1 mg/mL PCA injection (has no administration in time range)  hydroxyurea (HYDREA) capsule 1,000 mg (has no administration in time range)  HYDROmorphone (DILAUDID) injection 1 mg (1 mg Intravenous Given 04/27/23 1739)  HYDROmorphone (DILAUDID) injection 1 mg (1 mg Intravenous Given 04/27/23 1831)  diphenhydrAMINE (BENADRYL) 12.5 mg in sodium chloride 0.9 % 50 mL IVPB (0 mg Intravenous Stopped 04/27/23 2037)  HYDROmorphone (DILAUDID) injection 1 mg (1 mg Intravenous Given 04/27/23 2044)   ED Course/ Medical Decision Making/ A&P Clinical Course as of 04/27/23 2235  Fri Apr 27, 2023  2153 Medicine to admit [BH]    Clinical Course User Index [BH] Danie Hannig A, PA-C   43 year old history of sickle cell, CKD here for evaluation of lower extremity pain and anemia.   History of similar.  Was feeling well up until this morning.  He has an generalized fatigue.  No chest pain, shortness of breath.  He has no clinical evidence of VTE.  His heart and lungs are clear.  He is afebrile.  I low suspicion for VTE, PE, acute chest syndrome. Will plan on pain medication for sickle cell crises as well as repeat labs to check for anemia.   Labs and imaging personally viewed and interpreted:  CBC leukocytosis 11.1, hemoglobin 6.9 EKG without ischemic changes   Metabolic panel clotted, will need recollection--Metabolic panel potassium 3.4, creatinine 2.64  Patient reassessed.  Pain improved however still has pain 8 out of 10.  We discussed his hemoglobin is 6.9.  On his prior admission when his hemoglobin was 6.9 they did not feel this met transfusion threshold and he was not transfused.  He does state he will need to be admitted for pain control.   Patient reassessed.  Still having pain.  Has gotten 3 doses of IV pain medicine here in the ED.  He feels he needs to be admitted for pain control.  Will discuss with medicine team.  Discussed with Dr. Edward Jolly with medicine team who evaluate patient for admission  The patient appears reasonably stabilized for admission considering the current resources, flow, and capabilities available in the ED at this time, and I doubt any other [facility] requiring further screening and/or treatment in the ED prior to admission.                                  Medical Decision Making Amount and/or Complexity of Data Reviewed External Data Reviewed: labs, radiology, ECG and notes. Labs: ordered. Decision-making details documented in ED Course. ECG/medicine tests: ordered and independent interpretation performed. Decision-making details documented in ED Course.  Risk OTC drugs. Prescription drug management. Parenteral controlled substances. Decision regarding hospitalization.  Diagnosis or treatment significantly limited by social  determinants of health.         Final Clinical Impression(s) / ED Diagnoses Final diagnoses:  Sickle cell pain crisis (HCC)  Anemia due to chronic kidney disease, unspecified CKD stage    Rx / DC Orders ED Discharge Orders     None         Mackensey Bolte A, PA-C 04/27/23 2235    Charlynne Pander, MD 04/27/23 2252

## 2023-04-27 NOTE — Telephone Encounter (Signed)
 See note

## 2023-04-27 NOTE — H&P (Signed)
 History and Physical    Patient: [name] [name]. AVW:[id number] DOB: 1980-10-19 DOA: 04/27/2023 DOS: the patient was seen and examined on 04/27/2023 PCP: Ivonne Andrew, NP  Patient coming from: Home  Chief Complaint:  Chief Complaint  Patient presents with   Sickle Cell Pain Crisis   HPI: [name] Barberi. is a 43 y.o. male with medical history significant of sickle cell disease, chronic pain syndrome, opioid dependent, CKD stage IV who presented to the hospital complaining increased on lower extremity pain.  He was seen by his PCP yesterday found to have hemoglobin of 6.3 and was advised to come to the emergency department for possible transfusion.  He has been taking his pain medication at home as prescribed, he reports pain was 10 out of 10.  He reports on weakness and mild difficulty breathing however he denies fevers, headaches, chest pain, palpitations, blurry vision, nausea, vomiting, abdominal pain and syncope.  No melena or hematochezia.  He denies any recent travel.  Upon ER evaluation found to be mild hypokalemic at 3.4, creatinine slightly above baseline at 2.64.  Hemoglobin 6.9.  Reticulocyte 5.6.  Patient was given total of 3 mg of IV Dilaudid and pain mildly improved.  Review of Systems: As mentioned in the history of present illness. All other systems reviewed and are negative.  Past Medical History:  Diagnosis Date   Abscess of right iliac fossa 09/24/2018   required Perc Drain 09/24/2018   Arachnoid Cyst of brain bilaterally    "2 really small ones in the back of my head; inside; saw them w/MRI" (09/25/2012)   Bacterial pneumonia ~ 2012   "caught it here in the hospital" (09/25/2012)   Chronic kidney disease    "from my sickle cell" (09/25/2012)   CKD (chronic kidney disease), stage II    Colitis 04/19/2017   CT scan abd/ pelvis   GERD (gastroesophageal reflux disease)    "after I eat alot of spicey foods" (09/25/2012)   Gynecomastia, male 07/10/2012    History of blood transfusion    "always related to sickle cell crisis" (09/25/2012)   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   Noted in noted from H[facility]ology [facility] at [facility]   Migraines    "take RX qd to prevent them" (09/25/2012)   Opioid dependence with withdrawal (HCC) 08/30/2012   Renal insufficiency    Sickle cell anemia (HCC)    Sickle cell crisis (HCC) 09/25/2012   Sickle cell nephropathy (HCC) 07/10/2012   Sinus tachycardia    Tachycardia with heart rate 121-140 beats per minute with ambulation 08/04/2016   Past Surgical History:  Procedure Laterality Date   ABCESS DRAINAGE     CHOLECYSTECTOMY  ~ 2012   COLONOSCOPY N/A 04/23/2017   Procedure: COLONOSCOPY;  Surgeon: Hilarie Fredrickson, MD;  Location: [facility] ENDOSCOPY;  Service: Endoscopy;  Laterality: N/A;   IR FLUORO GUIDE CV LINE RIGHT  12/17/2016   IR FLUORO GUIDE CV LINE RIGHT  02/07/2021   IR RADIOLOGIST EVAL & MGMT  10/02/2018   IR RADIOLOGIST EVAL & MGMT  10/15/2018   IR REMOVAL TUN CV CATH W/O FL  12/21/2016   IR REMOVAL TUN CV CATH W/O FL  02/11/2021   IR US GUIDE VASC ACCESS RIGHT  12/17/2016   IR US GUIDE VASC ACCESS RIGHT  02/07/2021   spleenectomy     Social History:  reports that he has quit smoking. His smoking use included cigarettes. He has never used smokeless tobacco. He reports current alcohol  use. He reports current drug use. Drug: Marijuana.  Allergies  Allergen Reactions   Nsaids Other (See Comments)    Kidney disease   Morphine And Codeine Other (See Comments)    "Really bad headaches"     Family History  Problem Relation Age of Onset   Breast cancer Mother     Prior to Admission medications   Medication Sig Start Date End Date Taking? Authorizing Provider  Deferiprone, Twice Daily, (FERRIPROX TWICE-A-DAY) 1000 MG TABS Take 1,000 mg by mouth 2 (two) times daily. 04/02/23  Yes [provider]  sodium zirconium cyclosilicate (LOKELMA) 10 g PACK packet Take 10 g by mouth daily. 04/21/23  04/22/24 Yes [provider]  amitriptyline (ELAVIL) 25 MG tablet Take 2 tablets (50 mg total) by mouth at bedtime. Patient taking differently: Take 75 mg by mouth at bedtime. 01/25/23   Ivonne Andrew, NP  calcitRIOL (ROCALTROL) 0.25 MCG capsule Take 0.25 mcg by mouth daily with breakfast. 11/16/20   [provider]  Deferiprone, Twice Daily, (FERRIPROX TWICE-A-DAY) 1000 MG TABS Take 3,000 mg by mouth daily. 05/01/22   Massie Maroon, FNP  folic acid (FOLVITE) 1 MG tablet TAKE 1 TABLET BY MOUTH EVERY DAY Patient taking differently: Take 1 mg by mouth every morning. 03/22/20   Massie Maroon, FNP  hydroxyurea (HYDREA) 500 MG capsule Take 1 capsule by mouth in the morning on Sun/Tues/Wed/Thurs/Sat and 2 capsules on Mon/Fri 01/25/23   Ivonne Andrew, NP  methylPREDNISolone (MEDROL DOSEPAK) 4 MG TBPK tablet Take by mouth as directed. 01/19/23   [provider]  oxyCODONE (OXYCONTIN) 15 mg 12 hr tablet Take 1 tablet (15 mg) by mouth every 12 hours. 04/26/23   Ivonne Andrew, NP  Oxycodone HCl 10 MG TABS Take 1 tablet (10 mg total) by mouth See admin instructions. Take 10 mg by mouth every 6 hours 04/18/23   Ivonne Andrew, NP  sodium bicarbonate 650 MG tablet Take 1,950 mg by mouth with breakfast, with lunch, and with evening meal. 11/07/18   [provider]  TYLENOL 8 HOUR ARTHRITIS PAIN 650 MG CR tablet Take 650 mg by mouth every 8 (eight) hours as needed for pain.    [provider]  VELTASSA 16.8 g PACK Take 16.8 g by mouth daily. 10/10/21   [provider]    Physical Exam: Vitals:   04/27/23 1445 04/27/23 1743 04/27/23 1749 04/27/23 2053  BP: 123/88  (!) 139/96 112/77  Pulse: (!) 101  92 99  Resp: 16  16 17   Temp:  98.7 F (37.1 C)  98.4 F (36.9 C)  TempSrc:  Oral  Oral  SpO2: 100%  97% 97%   Assessment and Plan: Service: Hospitalist  #1 Sickle Cell Disease with crisis Will place patient in observation, will start IV fluids  at 75 mL/L.  Start weight-based Dilaudid PCA.  Avoid IV Dilaudid due to CKD.  Restart oral home pain regimen.  Monitor vitals closely, reevaluate pain, O2 by [place] as needed.  #2  AKI on CKD stage IV SCr is slightly above baseline, IV fluid.  Avoid NSAIDs.  #3 Anemia of chronic disease Hemoglobin is 6.9, this is below baseline, however not at threshold for blood transfusion at this time.  Will check CBC closely, repeat tonight and in a.m.  #4 Chronic pain syndrome Resume home medications.  DVT prophylaxis: Lovenox Code Status: Full Code Family Communication: None at bedside Disposition Plan: Anticipate discharge to previous home environment.  Consults called:  None Severity of Illness: The appropriate patient status for this patient is OBSERVATION. Observation status is judged to be reasonable and necessary in order to provide the required intensity of service to ensure the patient's safety. The patient's presenting symptoms, physical exam findings, and initial radiographic and laboratory data in the context of their medical condition is felt to place them at decreased risk for further clinical deterioration. Furthermore, it is anticipated that the patient will be medically stable for discharge from the hospital within 2 midnights of admission.   Author: Latrelle Dodrill, MD 04/27/2023 9:44 PM  For on call review [email or web address].

## 2023-04-28 DIAGNOSIS — D631 Anemia in chronic kidney disease: Secondary | ICD-10-CM | POA: Diagnosis not present

## 2023-04-28 DIAGNOSIS — N189 Chronic kidney disease, unspecified: Secondary | ICD-10-CM

## 2023-04-28 DIAGNOSIS — D5700 Hb-SS disease with crisis, unspecified: Principal | ICD-10-CM

## 2023-04-28 LAB — CBC
HCT: 17.7 % — ABNORMAL LOW (ref 39.0–52.0)
Hemoglobin: 6 g/dL — CL (ref 13.0–17.0)
MCH: 40.3 pg — ABNORMAL HIGH (ref 26.0–34.0)
MCHC: 33.9 g/dL (ref 30.0–36.0)
MCV: 118.8 fL — ABNORMAL HIGH (ref 80.0–100.0)
Platelets: 177 10*3/uL (ref 150–400)
RBC: 1.49 MIL/uL — ABNORMAL LOW (ref 4.22–5.81)
WBC: 10 10*3/uL (ref 4.0–10.5)
nRBC: 0.3 % — ABNORMAL HIGH (ref 0.0–0.2)

## 2023-04-28 LAB — HEMOGLOBIN AND HEMATOCRIT, BLOOD
HCT: 21.8 % — ABNORMAL LOW (ref 39.0–52.0)
Hemoglobin: 7.3 g/dL — ABNORMAL LOW (ref 13.0–17.0)

## 2023-04-28 LAB — LACTATE DEHYDROGENASE: LDH: 126 U/L (ref 98–192)

## 2023-04-28 LAB — PREPARE RBC (CROSSMATCH)

## 2023-04-28 MED ORDER — HYDROMORPHONE 1 MG/ML IV SOLN
INTRAVENOUS | Status: DC
  Administered 2023-04-28: 4.5 mL via INTRAVENOUS

## 2023-04-28 MED ORDER — SODIUM CHLORIDE 0.9% IV SOLUTION: Freq: Once | INTRAVENOUS | Status: AC

## 2023-04-28 NOTE — Progress Notes (Deleted)
   04/28/23 1453  TOC Brief Assessment  Insurance and Status Reviewed  Patient has primary care physician Yes  Home environment has been reviewed Lives alone in an apartment  Prior level of function: Independent wiht ADLs at baseline  Prior/Current Home Services No current home services  Social Drivers of Health Review SDOH reviewed no interventions necessary  Readmission risk has been reviewed Yes  Transition of care needs no transition of care needs at this time

## 2023-04-28 NOTE — Progress Notes (Signed)
   04/28/23 1453  TOC Brief Assessment  Insurance and Status Reviewed  Patient has primary care physician Yes  Home environment has been reviewed Lives alone in an apartment  Prior level of function: Independent with ADLs at baseline  Prior/Current Home Services No current home services  Social Drivers of Health Review SDOH reviewed no interventions necessary  Readmission risk has been reviewed Yes  Transition of care needs no transition of care needs at this time

## 2023-04-28 NOTE — Plan of Care (Signed)
  Problem: Education: Goal: Knowledge of vaso-occlusive preventative measures will improve Outcome: Adequate for Discharge Goal: Awareness of infection prevention will improve Outcome: Adequate for Discharge Goal: Awareness of signs and symptoms of anemia will improve Outcome: Adequate for Discharge Goal: Long-term complications will improve Outcome: Adequate for Discharge   Problem: Self-Care: Goal: Ability to incorporate actions that prevent/reduce pain crisis will improve Outcome: Adequate for Discharge   Problem: Bowel/Gastric: Goal: Gut motility will be maintained Outcome: Adequate for Discharge   Problem: Tissue Perfusion: Goal: Complications related to inadequate tissue perfusion will be avoided or minimized Outcome: Adequate for Discharge   Problem: Respiratory: Goal: Pulmonary complications will be avoided or minimized Outcome: Adequate for Discharge Goal: Acute Chest Syndrome will be identified early to prevent complications Outcome: Adequate for Discharge   Problem: Fluid Volume: Goal: Ability to maintain a balanced intake and output will improve Outcome: Adequate for Discharge   Problem: Sensory: Goal: Pain level will decrease with appropriate interventions Outcome: Adequate for Discharge   Problem: Health Behavior: Goal: Postive changes in compliance with treatment and prescription regimens will improve Outcome: Adequate for Discharge   Problem: Education: Goal: Knowledge of General Education information will improve Description: Including pain rating scale, medication(s)/side effects and non-pharmacologic comfort measures Outcome: Adequate for Discharge   Problem: Health Behavior/Discharge Planning: Goal: Ability to manage health-related needs will improve Outcome: Adequate for Discharge   Problem: Clinical Measurements: Goal: Ability to maintain clinical measurements within normal limits will improve Outcome: Adequate for Discharge Goal: Will remain  free from infection Outcome: Adequate for Discharge Goal: Diagnostic test results will improve Outcome: Adequate for Discharge Goal: Respiratory complications will improve Outcome: Adequate for Discharge Goal: Cardiovascular complication will be avoided Outcome: Adequate for Discharge   Problem: Activity: Goal: Risk for activity intolerance will decrease Outcome: Adequate for Discharge   Problem: Nutrition: Goal: Adequate nutrition will be maintained Outcome: Adequate for Discharge   Problem: Coping: Goal: Level of anxiety will decrease Outcome: Adequate for Discharge   Problem: Elimination: Goal: Will not experience complications related to bowel motility Outcome: Adequate for Discharge Goal: Will not experience complications related to urinary retention Outcome: Adequate for Discharge   Problem: Pain Managment: Goal: General experience of comfort will improve and/or be controlled Outcome: Adequate for Discharge   Problem: Safety: Goal: Ability to remain free from injury will improve Outcome: Adequate for Discharge   Problem: Skin Integrity: Goal: Risk for impaired skin integrity will decrease Outcome: Adequate for Discharge

## 2023-04-28 NOTE — Discharge Summary (Signed)
 Physician Discharge Summary  [name] [name]. WUJ:[id number] DOB: [birth year] DOA: 04/27/2023  PCP: [name] Andrew, NP  Admit date: 04/27/2023  Discharge date: 04/28/2023  Discharge Diagnoses:  Active Problems:   Sickle cell crisis [facility])   Discharge Condition: Stable  Disposition:   Follow-up Information     [name] Andrew, NP. Call in 1 week(s).   Specialties: Pulmonary Disease, Endocrinology Contact information: [address] [place] [address] [phone]                Pt is discharged home in good condition and is to follow up with [name] Andrew, [name] is instructed to increase activity slowly and balance with rest for the next few days, and use prescribed medication to complete treatment of pain  Diet: Regular Wt Readings from Last 3 Encounters:  04/28/23 56.7 kg  04/26/23 56.5 kg  03/22/23 52.2 kg    History of present illness:  [name] Newmark. is a 43 y.o. male with medical history significant of sickle cell disease, chronic pain syndrome, opioid dependent, CKD stage IV who presented to the hospital complaining increased on lower extremity pain.  He was seen by his PCP yesterday found to have hemoglobin of 6.3 and was advised to come to the emergency department for possible transfusion.  He has been taking his pain medication at home as prescribed, he reports pain was 10 out of 10.  He reports on weakness and mild difficulty breathing however he denies fevers, headaches, chest pain, palpitations, blurry vision, nausea, vomiting, abdominal pain and syncope.  No melena or hematochezia.  He denies any recent travel.   Upon ER evaluation found to be mild hypokalemic at 3.4, creatinine slightly above baseline at 2.64.  Hemoglobin 6.9.  Reticulocyte 5.6.  Patient was given total of 3 mg of IV Dilaudid and pain mildly improved.  Hospital Course:  Patient was admitted for sickle cell  pain crisis and managed appropriately with IVF, IV Dilaudid via PCA as well as other adjunct therapies per sickle cell pain management protocols.  Toradol is contraindicated due to CKD stage IV.  Patient's hemoglobin dropped below baseline to 6.0 for which she was transfused 1 unit of packed red blood cells during this admission.  Hemoglobin returned to baseline.  Patient requested to be discharged home, saying his pain is at baseline and now that he has gotten blood transfusion, he feels good and energetic to go home.  He is tolerating p.o. intake with no restrictions and ambulating well with no significant pain.  He denies any headache, dizziness, cough, chest pain, shortness of breath, nausea, vomiting or diarrhea. Patient was therefore discharged home today in a hemodynamically stable condition.   [name] [name] will follow-up with PCP within 1 week of this discharge. [name] [name] was counseled extensively about nonpharmacologic means of pain management, patient verbalized understanding and was appreciative of  the care received during this admission.   We discussed the need for good hydration, monitoring of hydration status, avoidance of heat, cold, stress, and infection triggers. We discussed the need to be adherent with taking Hydrea and other home medications. Patient was reminded of the need to seek medical attention immediately if any symptom of bleeding, anemia, or infection occurs.  Discharge Exam: Vitals:   04/28/23 1631 04/28/23 1650  BP: 116/84   Pulse: 88   Resp: 16 14  Temp: 98.1 F (36.7 C)   SpO2: 97%  Vitals:   04/28/23 1345 04/28/23 1401 04/28/23 1631 04/28/23 1650  BP: 103/68 101/71 116/84   Pulse: 95 99 88   Resp: 13 14 16 14   Temp: 98 F (36.7 C) 98 F (36.7 C) 98.1 F (36.7 C)   TempSrc: Oral Oral Oral   SpO2: 96% 96% 97%   Weight:      Height:        General appearance : Awake, alert, not in any distress. Speech Clear. Not toxic looking HEENT: Atraumatic and  Normocephalic, pupils equally reactive to light and accomodation Neck: Supple, no JVD. No cervical lymphadenopathy.  Chest: Good air entry bilaterally, no added sounds  CVS: S1 S2 regular, no murmurs.  Abdomen: Bowel sounds present, Non tender and not distended with no gaurding, rigidity or rebound. Extremities: B/L Lower Ext shows no edema, both legs are warm to touch Neurology: Awake alert, and oriented X 3, CN II-XII intact, Non focal Skin: No Rash  Discharge Instructions  Discharge Instructions     Diet - low sodium heart healthy   Complete by: As directed    Increase activity slowly   Complete by: As directed       Allergies as of 04/28/2023       Reactions   Nsaids Other (See Comments)   Kidney disease   Morphine And Codeine Other (See Comments)   "Really bad headaches"         Medication List     TAKE these medications    amitriptyline 25 MG tablet Commonly known as: ELAVIL Take 2 tablets (50 mg total) by mouth at bedtime. What changed: how much to take   calcitRIOL 0.25 MCG capsule Commonly known as: ROCALTROL Take 0.25 mcg by mouth daily with breakfast.   Ferriprox Twice-A-Day 1000 MG Tabs Generic drug: Deferiprone (Twice Daily) Take 3,000 mg by mouth daily.   folic acid 1 MG tablet Commonly known as: FOLVITE TAKE 1 TABLET BY MOUTH EVERY DAY What changed: when to take this   hydroxyurea 500 MG capsule Commonly known as: HYDREA Take 1 capsule by mouth in the morning on Sun/Tues/Wed/Thurs/Sat and 2 capsules on Mon/Fri   Oxycodone HCl 10 MG Tabs Take 1 tablet (10 mg total) by mouth See admin instructions. Take 10 mg by mouth every 6 hours   oxyCODONE 15 mg 12 hr tablet Commonly known as: OXYCONTIN Take 1 tablet (15 mg) by mouth every 12 hours.   sodium bicarbonate 650 MG tablet Take 1,950 mg by mouth with breakfast, with lunch, and with evening meal.   sodium zirconium cyclosilicate 10 g Pack packet Commonly known as: LOKELMA Take 10 g by  mouth daily.   Tylenol 8 Hour Arthritis Pain 650 MG CR tablet Generic drug: acetaminophen Take 650 mg by mouth every 8 (eight) hours as needed for pain.        The results of significant diagnostics from this hospitalization (including imaging, microbiology, ancillary and laboratory) are listed below for reference.    Significant Diagnostic Studies: No results found.  Microbiology: No results found for this or any previous visit (from the past 240 hours).   Labs: Basic Metabolic Panel: Recent Labs  Lab 04/26/23 1032 04/27/23 2006  NA 138 137  K 3.9 3.4*  CL 101 99  CO2 24 27  GLUCOSE 89 137*  BUN 41* 43*  CREATININE 3.10* 2.64*  CALCIUM 8.1* 8.4*   Liver Function Tests: Recent Labs  Lab 04/26/23 1032 04/27/23 2006  AST 40 40  ALT 30 30  ALKPHOS 240* 199*  BILITOT 0.9 1.4*  PROT 6.1 6.9  ALBUMIN 3.5* 3.0*   No results for input(s): "LIPASE", "AMYLASE" in the last 168 hours. No results for input(s): "AMMONIA" in the last 168 hours. CBC: Recent Labs  Lab 04/26/23 1032 04/27/23 1745 04/28/23 0144 04/28/23 1901  WBC 10.3 11.1* 10.0  --   NEUTROABS 3.5 4.7  --   --   HGB 6.3* 6.9* 6.0* 7.3*  HCT 19.1* 19.3* 17.7* 21.8*  MCV 119* 116.3* 118.8*  --   PLT 199 233 177  --    Cardiac Enzymes: No results for input(s): "CKTOTAL", "CKMB", "CKMBINDEX", "TROPONINI" in the last 168 hours. BNP: Invalid input(s): "POCBNP" CBG: No results for input(s): "GLUCAP" in the last 168 hours.  Time coordinating discharge: 50 minutes  Signed:  Sharleen Szczesny  [facility] 04/28/2023, 7:29 PM

## 2023-04-28 NOTE — Progress Notes (Signed)
 Pt refusing renal diet. Stated that he usually has a regular diet during admission. Provider notified and stated that due to patient worsening kidney function pt would have to remain as a renal diet. Pt informed and stated that his diet order will need to change or the provider can discharge him.  Due to pt being noncompliant with continuous pulse ox monitoring with PCA, this nurse attempted to turn off pts PCA pump. Pt agreed to place continuous pulse ox on so his PCA wouldn't be turned off.  Pt hgb is 6.0 and provider is aware. Pt would like to receive blood products and leave AMA after. Provider aware. Rise and fall of chest observed, call light within reach, bed in the lowest position and care continues.

## 2023-04-28 NOTE — Plan of Care (Signed)

## 2023-04-28 NOTE — Progress Notes (Signed)
 This nurse attempted to place pt on continuous pulse ox. Pt refused. This nurse provided education and stated the purpose of continuous pulse ox is to monitor patients oxygen while receiving pain pump, to make sure hiss oxygen doesn't drop while receiving pain medication. Pt still refused and stated that "this isn't his first rodeo and that he doesn't need it put on his finger". Rise and fall of chest observed, call light within reach, bed in the lowest position and care continues.

## 2023-04-30 LAB — TYPE AND SCREEN
ABO/RH(D): O POS
Antibody Screen: NEGATIVE
Donor AG Type: NEGATIVE
Unit division: 0

## 2023-04-30 LAB — BPAM RBC
Blood Product Expiration Date: 202504162359
ISSUE DATE / TIME: 202503151335
Unit Type and Rh: 5100

## 2023-05-01 LAB — TOXASSURE FLEX 15, UR
6-ACETYLMORPHINE IA: NEGATIVE ng/mL
AMPHETAMINES IA: NEGATIVE ng/mL
BARBITURATES IA: NEGATIVE ng/mL
CANNABINOIDS IA: NEGATIVE ng/mL
Creatinine: 56 mg/dL
ETHYL ALCOHOL Enzymatic: NEGATIVE g/dL
METHADONE IA: NEGATIVE ng/mL
METHADONE MTB IA: NEGATIVE ng/mL
PHENCYCLIDINE IA: NEGATIVE ng/mL
TAPENTADOL, IA: NEGATIVE ng/mL
TRAMADOL IA: NEGATIVE ng/mL

## 2023-05-01 LAB — OPIATE CLASS, MS, UR RFX
Codeine: NOT DETECTED ng/mg{creat}
Dihydrocodeine: NOT DETECTED ng/mg{creat}
Hydrocodone: NOT DETECTED ng/mg{creat}
Hydromorphone: NOT DETECTED ng/mg{creat}
Morphine: NOT DETECTED ng/mg{creat}
Norcodeine: NOT DETECTED ng/mg{creat}
Norhydrocodone: NOT DETECTED ng/mg{creat}
Normorphine: NOT DETECTED ng/mg{creat}
Opiate Class Confirmation: NEGATIVE

## 2023-05-01 LAB — OXYCODONE CLASS, MS, UR RFX
Noroxycodone: 2191 ng/mg{creat}
Noroxymorphone: 1170 ng/mg{creat}
Oxycodone Class Confirmation: POSITIVE
Oxycodone: 988 ng/mg{creat}
Oxymorphone: 1725 ng/mg{creat}

## 2023-05-01 LAB — COCAINE AND MTB, MS, UR RFX
Benzoylecgonine: 2880 ng/mg{creat}
Cocaethylene: NOT DETECTED ng/mg{creat}
Cocaine Confirmation: POSITIVE
Cocaine: NOT DETECTED ng/mg{creat}

## 2023-05-01 LAB — HIGH SENSITIVITY CRP: CRP, High Sensitivity: 48.4 mg/L — ABNORMAL HIGH (ref 0.00–3.00)

## 2023-05-07 ENCOUNTER — Telehealth (HOSPITAL_COMMUNITY): Payer: Self-pay

## 2023-05-07 NOTE — Telephone Encounter (Signed)
 Pt called day hospital wanting to come in today for sickle cell pain treatment and to have labs checked. RN advised pt that the clinic is closed today due to staffing and the clinic closing early today. RN advised pt that the clinic should be open tomorrow. RN advised pt that she could transfer pts call to primary care side to see if he can get an appointment to have his labs checked. Per pt he has an appointment already on Friday. Per pt he will wait to call the day hospital again tomorrow.

## 2023-05-08 ENCOUNTER — Encounter (HOSPITAL_COMMUNITY): Payer: Self-pay | Admitting: Internal Medicine

## 2023-05-08 ENCOUNTER — Other Ambulatory Visit: Payer: Self-pay | Admitting: Nurse Practitioner

## 2023-05-08 ENCOUNTER — Observation Stay (HOSPITAL_COMMUNITY)
Admission: AD | Admit: 2023-05-08 | Discharge: 2023-05-09 | Disposition: A | Discharge status: Home / Self Care | Source: Ambulatory Visit | Attending: Internal Medicine | Admitting: Internal Medicine

## 2023-05-08 ENCOUNTER — Other Ambulatory Visit: Payer: Self-pay

## 2023-05-08 ENCOUNTER — Telehealth (HOSPITAL_COMMUNITY): Payer: Self-pay

## 2023-05-08 DIAGNOSIS — D571 Sickle-cell disease without crisis: Secondary | ICD-10-CM

## 2023-05-08 DIAGNOSIS — D57219 Sickle-cell/Hb-C disease with crisis, unspecified: Secondary | ICD-10-CM | POA: Diagnosis present

## 2023-05-08 DIAGNOSIS — R42 Dizziness and giddiness: Secondary | ICD-10-CM | POA: Diagnosis not present

## 2023-05-08 DIAGNOSIS — E876 Hypokalemia: Secondary | ICD-10-CM | POA: Diagnosis present

## 2023-05-08 DIAGNOSIS — D5700 Hb-SS disease with crisis, unspecified: Principal | ICD-10-CM | POA: Diagnosis present

## 2023-05-08 DIAGNOSIS — G894 Chronic pain syndrome: Secondary | ICD-10-CM

## 2023-05-08 DIAGNOSIS — N184 Chronic kidney disease, stage 4 (severe): Secondary | ICD-10-CM | POA: Diagnosis not present

## 2023-05-08 LAB — CBC WITH DIFFERENTIAL/PLATELET
Abs Immature Granulocytes: 0.06 10*3/uL (ref 0.00–0.07)
Basophils Absolute: 0 10*3/uL (ref 0.0–0.1)
Basophils Relative: 0 %
Eosinophils Absolute: 0.1 10*3/uL (ref 0.0–0.5)
Eosinophils Relative: 1 %
HCT: 20.1 % — ABNORMAL LOW (ref 39.0–52.0)
Hemoglobin: 6.7 g/dL — CL (ref 13.0–17.0)
Immature Granulocytes: 1 %
Lymphocytes Relative: 49 %
Lymphs Abs: 4.3 10*3/uL — ABNORMAL HIGH (ref 0.7–4.0)
MCH: 38.4 pg — ABNORMAL HIGH (ref 26.0–34.0)
MCHC: 32.8 g/dL (ref 30.0–36.0)
MCV: 116.9 fL — ABNORMAL HIGH (ref 80.0–100.0)
Monocytes Absolute: 0.4 10*3/uL (ref 0.1–1.0)
Monocytes Relative: 5 %
Neutro Abs: 3.8 10*3/uL (ref 1.7–7.7)
Neutrophils Relative %: 44 %
Platelets: 350 10*3/uL (ref 150–400)
RBC: 1.72 MIL/uL — ABNORMAL LOW (ref 4.22–5.81)
RDW: 27.9 % — ABNORMAL HIGH (ref 11.5–15.5)
WBC: 8.6 10*3/uL (ref 4.0–10.5)
nRBC: 0.6 % — ABNORMAL HIGH (ref 0.0–0.2)

## 2023-05-08 LAB — COMPREHENSIVE METABOLIC PANEL
ALT: 27 U/L (ref 0–44)
AST: 46 U/L — ABNORMAL HIGH (ref 15–41)
Albumin: 2.5 g/dL — ABNORMAL LOW (ref 3.5–5.0)
Alkaline Phosphatase: 185 U/L — ABNORMAL HIGH (ref 38–126)
Anion gap: 10 (ref 5–15)
BUN: 33 mg/dL — ABNORMAL HIGH (ref 6–20)
CO2: 31 mmol/L (ref 22–32)
Calcium: 8.3 mg/dL — ABNORMAL LOW (ref 8.9–10.3)
Chloride: 101 mmol/L (ref 98–111)
Creatinine, Ser: 2.85 mg/dL — ABNORMAL HIGH (ref 0.61–1.24)
GFR, Estimated: 28 mL/min — ABNORMAL LOW (ref >60)
Glucose, Bld: 86 mg/dL (ref 70–99)
Potassium: 3.3 mmol/L — ABNORMAL LOW (ref 3.5–5.1)
Sodium: 142 mmol/L (ref 135–145)
Total Bilirubin: 0.8 mg/dL (ref 0.0–1.2)
Total Protein: 6.4 g/dL — ABNORMAL LOW (ref 6.5–8.1)

## 2023-05-08 LAB — RETICULOCYTES
Immature Retic Fract: 29 % — ABNORMAL HIGH (ref 2.3–15.9)
RBC.: 1.68 MIL/uL — ABNORMAL LOW (ref 4.22–5.81)
Retic Count, Absolute: 107.4 10*3/uL (ref 19.0–186.0)
Retic Ct Pct: 6.4 % — ABNORMAL HIGH (ref 0.4–3.1)

## 2023-05-08 LAB — LACTATE DEHYDROGENASE: LDH: 264 U/L — ABNORMAL HIGH (ref 98–192)

## 2023-05-08 MED ORDER — SODIUM CHLORIDE 0.9% FLUSH: 9.0000 mL | INTRAVENOUS | Status: DC | PRN

## 2023-05-08 MED ORDER — HYDROMORPHONE 1 MG/ML IV SOLN
INTRAVENOUS | Status: DC
  Administered 2023-05-08: 10.5 mg via INTRAVENOUS
  Administered 2023-05-08: 30 mg via INTRAVENOUS
  Administered 2023-05-09: 4.5 mg via INTRAVENOUS
  Administered 2023-05-09: 5.5 mg via INTRAVENOUS
  Administered 2023-05-09: 30 mg via INTRAVENOUS
  Filled 2023-05-08 (×2): qty 30

## 2023-05-08 MED ORDER — NALOXONE HCL 0.4 MG/ML IJ SOLN: 0.4000 mg | INTRAMUSCULAR | Status: DC | PRN

## 2023-05-08 MED ORDER — ONDANSETRON HCL 4 MG PO TABS: 4.0000 mg | ORAL_TABLET | ORAL | Status: DC | PRN

## 2023-05-08 MED ORDER — ONDANSETRON HCL 4 MG/2ML IJ SOLN
4.0000 mg | INTRAMUSCULAR | Status: DC | PRN
  Administered 2023-05-08: 4 mg via INTRAVENOUS
  Filled 2023-05-08: qty 2

## 2023-05-08 MED ORDER — DIPHENHYDRAMINE HCL 25 MG PO CAPS
25.0000 mg | ORAL_CAPSULE | ORAL | Status: DC | PRN
Start: 2023-05-08 — End: 2023-05-10

## 2023-05-08 MED ORDER — OXYCODONE HCL 10 MG PO TABS: 10.0000 mg | ORAL_TABLET | ORAL | 0 refills | Status: DC

## 2023-05-08 MED ORDER — POLYETHYLENE GLYCOL 3350 17 G PO PACK: 17.0000 g | PACK | Freq: Every day | ORAL | Status: DC | PRN

## 2023-05-08 MED ORDER — DIPHENHYDRAMINE HCL 25 MG PO CAPS: 25.0000 mg | ORAL_CAPSULE | ORAL | Status: DC | PRN

## 2023-05-08 MED ORDER — SENNOSIDES-DOCUSATE SODIUM 8.6-50 MG PO TABS
1.0000 | ORAL_TABLET | Freq: Two times a day (BID) | ORAL | Status: DC
  Filled 2023-05-08: qty 1

## 2023-05-08 MED ORDER — ONDANSETRON HCL 4 MG/2ML IJ SOLN: 4.0000 mg | Freq: Four times a day (QID) | INTRAMUSCULAR | Status: DC | PRN

## 2023-05-08 MED ORDER — POTASSIUM CHLORIDE CRYS ER 20 MEQ PO TBCR
40.0000 meq | EXTENDED_RELEASE_TABLET | Freq: Every day | ORAL | Status: DC
  Administered 2023-05-08 – 2023-05-09 (×2): 40 meq via ORAL
  Filled 2023-05-08 (×2): qty 2

## 2023-05-08 MED ORDER — SODIUM CHLORIDE 0.45 % IV SOLN: INTRAVENOUS | Status: DC

## 2023-05-08 MED ORDER — ACETAMINOPHEN 500 MG PO TABS
1000.0000 mg | ORAL_TABLET | Freq: Once | ORAL | Status: AC
  Administered 2023-05-08: 1000 mg via ORAL
  Filled 2023-05-08: qty 2

## 2023-05-08 MED ORDER — DIPHENHYDRAMINE HCL 25 MG PO CAPS
25.0000 mg | ORAL_CAPSULE | ORAL | Status: DC | PRN
  Administered 2023-05-08: 25 mg via ORAL
  Filled 2023-05-08: qty 1

## 2023-05-08 NOTE — Telephone Encounter (Deleted)
 Pt is calling to refill his Oxycodone 10 mg   [employer]

## 2023-05-08 NOTE — H&P (Cosign Needed Addendum)
 [facility] History and Physical  [name] [name]. ZOX:[id number] DOB: [birth year] DOA: 05/08/2023  PCP: Ivonne Andrew, NP   Chief Complaint:  Chief Complaint  Patient presents with   Sickle Cell Pain Crisis    HPI: [name] [name]. is a 43 y.o. male with history of sickle cell disease, chronic pain syndrome, opiate dependence and tolerance, anemia of chronic disease, and CKD stage IV who presented to the day hospital today with major complaints of generalized body pain but primarily in his head and lower extremities. Patient claims he has had 1 fall in the last week, he sustained cuts and bruises on his right knee during these falls, no inciting factors. Now he is in severe pain, rated at 10/10, generalized but mostly in the right knee areas. Patients right arm is also swollen. He describes this pain as constant and achy and throbbing. He took his home medications with no sustained relief. He is also reporting dizziness. Worse when he sits up. He denies any fever, headache, cough, blurry vision, chest pain, shortness of breath, nausea, vomiting or diarrhea. No urinary symptoms. He denies any recent travels, sick contacts   Systemic Review: General: The patient denies anorexia, fever, weight loss Cardiac: Denies chest pain, syncope, palpitations, pedal edema  Respiratory: Denies cough, shortness of breath, wheezing GI: Denies severe indigestion/heartburn, abdominal pain, nausea, vomiting, diarrhea and constipation GU: Denies hematuria, incontinence, dysuria  Musculoskeletal: Denies arthritis  Skin: Denies suspicious skin lesions Neurologic: Denies focal weakness or numbness, change in vision  Past Medical History:  Diagnosis Date   Abscess of right iliac fossa 09/24/2018   required Perc Drain 09/24/2018   Arachnoid Cyst of brain bilaterally    "2 really small ones in the back of my head; inside; saw them w/MRI" (09/25/2012)   Bacterial pneumonia ~ 2012    "caught it here in the hospital" (09/25/2012)   Chronic kidney disease    "from my sickle cell" (09/25/2012)   CKD (chronic kidney disease), stage II    Colitis 04/19/2017   CT scan abd/ pelvis   GERD (gastroesophageal reflux disease)    "after I eat alot of spicey foods" (09/25/2012)   Gynecomastia, male 07/10/2012   History of blood transfusion    "always related to sickle cell crisis" (09/25/2012)   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   Noted in noted from H[facility] at [facility]   Migraines    "take RX qd to prevent them" (09/25/2012)   Opioid dependence with withdrawal (HCC) 08/30/2012   Renal insufficiency    Sickle cell anemia (HCC)    Sickle cell crisis (HCC) 09/25/2012   Sickle cell nephropathy (HCC) 07/10/2012   Sinus tachycardia    Tachycardia with heart rate 121-140 beats per minute with ambulation 08/04/2016    Past Surgical History:  Procedure Laterality Date   ABCESS DRAINAGE     CHOLECYSTECTOMY  ~ 2012   COLONOSCOPY N/A 04/23/2017   Procedure: COLONOSCOPY;  Surgeon: Hilarie Fredrickson, MD;  Location: [facility];  Service: Endoscopy;  Laterality: N/A;   IR FLUORO GUIDE CV LINE RIGHT  12/17/2016   IR FLUORO GUIDE CV LINE RIGHT  02/07/2021   IR RADIOLOGIST EVAL & MGMT  10/02/2018   IR RADIOLOGIST EVAL & MGMT  10/15/2018   IR REMOVAL TUN CV CATH W/O FL  12/21/2016   IR REMOVAL TUN CV CATH W/O FL  02/11/2021   IR US GUIDE VASC ACCESS RIGHT  12/17/2016   IR US GUIDE  VASC ACCESS RIGHT  02/07/2021   spleenectomy      Allergies  Allergen Reactions   Nsaids Other (See Comments)    Kidney disease   Morphine And Codeine Other (See Comments)    "Really bad headaches"     Family History  Problem Relation Age of Onset   Breast cancer Mother       Prior to Admission medications   Medication Sig Start Date End Date Taking? Authorizing Provider  amitriptyline (ELAVIL) 25 MG tablet Take 2 tablets (50 mg total) by mouth at bedtime. Patient taking differently:  Take 75 mg by mouth at bedtime. 01/25/23   Ivonne Andrew, NP  calcitRIOL (ROCALTROL) 0.25 MCG capsule Take 0.25 mcg by mouth daily with breakfast. 11/16/20   [provider]  Deferiprone, Twice Daily, (FERRIPROX TWICE-A-DAY) 1000 MG TABS Take 3,000 mg by mouth daily. 05/01/22   Massie Maroon, FNP  folic acid (FOLVITE) 1 MG tablet TAKE 1 TABLET BY MOUTH EVERY DAY Patient taking differently: Take 1 mg by mouth every morning. 03/22/20   Massie Maroon, FNP  hydroxyurea (HYDREA) 500 MG capsule Take 1 capsule by mouth in the morning on Sun/Tues/Wed/Thurs/Sat and 2 capsules on Mon/Fri 01/25/23   Ivonne Andrew, NP  oxyCODONE (OXYCONTIN) 15 mg 12 hr tablet Take 1 tablet (15 mg) by mouth every 12 hours. 04/26/23   Ivonne Andrew, NP  Oxycodone HCl 10 MG TABS Take 1 tablet (10 mg total) by mouth See admin instructions. Take 10 mg by mouth every 6 hours 04/18/23   Ivonne Andrew, NP  sodium bicarbonate 650 MG tablet Take 1,950 mg by mouth with breakfast, with lunch, and with evening meal. 11/07/18   [provider]  sodium zirconium cyclosilicate (LOKELMA) 10 g PACK packet Take 10 g by mouth daily. 04/21/23 04/22/24  [provider]  TYLENOL 8 HOUR ARTHRITIS PAIN 650 MG CR tablet Take 650 mg by mouth every 8 (eight) hours as needed for pain.    [provider]     Physical Exam: Vitals:   05/08/23 1153 05/08/23 1340 05/08/23 1508 05/08/23 1509  BP: 113/77 123/88 121/85   Pulse: 89 81 86   Resp: 11 12 14 14   Temp:  97.7 F (36.5 C) 98.2 F (36.8 C)   TempSrc:  Temporal Temporal   SpO2: 95% 99% 97%     General: Alert, awake, afebrile, anicteric, not in obvious distress HEENT: Normocephalic and Atraumatic, Mucous membranes pink                PERRLA; EOM intact; No scleral icterus,                 Nares: Patent, Oropharynx: Clear, Fair Dentition                 Neck: FROM, no cervical lymphadenopathy, thyromegaly, carotid bruit or JVD;  CHEST WALL: No  tenderness  CHEST: Normal respiration, clear to auscultation bilaterally  HEART: Regular rate and rhythm; no murmurs rubs or gallops  BACK: No kyphosis or scoliosis; no CVA tenderness  ABDOMEN: Positive Bowel Sounds, soft, non-tender; no masses, no organomegaly EXTREMITIES: No cyanosis, clubbing, or edema SKIN:  no rash or ulceration  CNS: Alert and Oriented x 4, Nonfocal exam, CN 2-12 intact  Labs on Admission:  Basic Metabolic Panel: Recent Labs  Lab 05/08/23 0951  NA 142  K 3.3*  CL 101  CO2 31  GLUCOSE 86  BUN 33*  CREATININE 2.85*  CALCIUM  8.3*   Liver Function Tests: Recent Labs  Lab 05/08/23 0951  AST 46*  ALT 27  ALKPHOS 185*  BILITOT 0.8  PROT 6.4*  ALBUMIN 2.5*   No results for input(s): "LIPASE", "AMYLASE" in the last 168 hours. No results for input(s): "AMMONIA" in the last 168 hours. CBC: Recent Labs  Lab 05/08/23 0951  WBC 8.6  NEUTROABS 3.8  HGB 6.7*  HCT 20.1*  MCV 116.9*  PLT 350   Cardiac Enzymes: No results for input(s): "CKTOTAL", "CKMB", "CKMBINDEX", "TROPONINI" in the last 168 hours.  BNP (last 3 results) No results for input(s): "BNP" in the last 8760 hours.  ProBNP (last 3 results) No results for input(s): "PROBNP" in the last 8760 hours.  CBG: No results for input(s): "GLUCAP" in the last 168 hours.   Assessment/Plan Principal Problem:   Sickle cell anemia with pain (HCC) Active Problems:   CKD (chronic kidney disease), stage IV (HCC)   Sickle cell pain crisis (HCC)   Dizzy   Hypokalemia  Admits to the Day Hospital for extended observation IVF 0.45% Saline @ 125 mls/hour Weight based Dilaudid PCA started within 30 minutes of admission Acetaminophen 1000 mg x 1 dose Labs: CBCD, CMP, Retic Count and LDH Monitor vitals very closely, Re-evaluate pain scale every hour 2 L of Oxygen by Coulter Patient will be re-evaluated for pain in the context of function and relationship to baseline as care progresses. If no significant  relieve from pain (remains above 5/10) will transfer patient to inpatient services for further evaluation and management  Code Status: Full  Family Communication: None  DVT Prophylaxis: Ambulate as tolerated   Time spent: 35 Minutes  Daryll Drown NP  If 7PM-7AM, please contact night-coverage [email or web address] 05/08/2023, 3:54 PM

## 2023-05-08 NOTE — Progress Notes (Signed)
 Pt admitted to the day hospital by Lynnell Chad FNP,  for treatment of sickle cell pain crisis. Pt reported pain to bilateral knees rated a 10/10. Pt states approximately 4 days ago he fell at home, he states he became dizzy, lost consciousness and fell, pt states he hit his head but did not go to the ED after his fall, abrasions noted to R hand and swelling to hands bilateral and face. Provider made aware. Pt given PO Benadryl and Tylenol, IV zofran, placed on Dilaudid PCA 0.5/10/3, and hydrated with 1/2 NS IV fluids. [name] notified of critical hemoglobin of 6.7 reported by [facility] in lab. Provider discussed admitting pt to hospital for observation, pt agreeable to plan. Admission orders placed.  At handoff, pt reported pain of   4/10, alert and oriented. Handoff report given to [facility].

## 2023-05-08 NOTE — Progress Notes (Signed)
 Pts vitals remain stable prior to admission. At transfer. pt remains alert, oriented, and ambulatory. At transfer, pt rates his pain 5/10. PCA settings verified by Jana Half, RN. Report called and was given to [facility] prior to transfer to 5W. IV remains clean dry and intact at transfer. Pt was transferred by wheelchair to 5W bed 23 with PCA and fluids running.

## 2023-05-08 NOTE — Telephone Encounter (Signed)
 Patient called in. Complains of pain in legs bilateral rates 8/10. Denied chest pain, abd pain, fever, N/V/D. Wants to come in for treatment. Pt endorses dizziness and lightheadedness, states he fell twice last Saturday, pt is concerned and wants labs checked today. Last took oxycontin 15mg  and oxycodone 10mg  this morning . Pt denies recent visits to the ED and denies flu like symptoms or covid exposure. Pt states his mother is his transportation to and from center today. [name] notified, approved for pt to be seen in the day hospital today. Pt made aware verbalized understanding.

## 2023-05-08 NOTE — Telephone Encounter (Signed)
 Medication Refill -  Most Recent Primary Care Visit:  Provider: Ivonne Andrew  Department: [facility]  Visit Type: OFFICE VISIT  Date: 04/26/2023  Medication: Oxycodone 10 mg  Has the patient contacted their pharmacy? No (Agent: If no, request that the patient contact the pharmacy for the refill. If patient does not wish to contact the pharmacy document the reason why and proceed with request.) (Agent: If yes, when and what did the pharmacy advise?)  Is this the correct pharmacy for this prescription? Yes If no, delete pharmacy and type the correct one.  This is the patient's preferred pharmacy:   [facility] [address]  [place]  Has the prescription been filled recently? Yes  Is the patient out of the medication? Yes  Has the patient been seen for an appointment in the last year OR does the patient have an upcoming appointment? Yes  Can we respond through MyChart? No  Agent: Please be advised that Rx refills may take up to 3 business days. We ask that you follow-up with your pharmacy.

## 2023-05-08 NOTE — Progress Notes (Unsigned)
 Reviewed PDMP substance reporting system prior to prescribing opiate medications. No inconsistencies noted.   1. Hb-SS disease without crisis (HCC)  - Oxycodone HCl 10 MG TABS; Take 1 tablet (10 mg total) by mouth See admin instructions. Take 10 mg by mouth every 6 hours  Dispense: 60 tablet; Refill: 0  2. Chronic pain syndrome  - Oxycodone HCl 10 MG TABS; Take 1 tablet (10 mg total) by mouth See admin instructions. Take 10 mg by mouth every 6 hours  Dispense: 60 tablet; Refill: 0

## 2023-05-09 ENCOUNTER — Inpatient Hospital Stay (HOSPITAL_COMMUNITY)

## 2023-05-09 ENCOUNTER — Encounter (HOSPITAL_COMMUNITY): Payer: Self-pay | Admitting: Internal Medicine

## 2023-05-09 DIAGNOSIS — D57219 Sickle-cell/Hb-C disease with crisis, unspecified: Secondary | ICD-10-CM | POA: Diagnosis not present

## 2023-05-09 DIAGNOSIS — D571 Sickle-cell disease without crisis: Secondary | ICD-10-CM | POA: Diagnosis present

## 2023-05-09 LAB — CBC WITH DIFFERENTIAL/PLATELET
Abs Immature Granulocytes: 0.03 10*3/uL (ref 0.00–0.07)
Basophils Absolute: 0 10*3/uL (ref 0.0–0.1)
Basophils Relative: 0 %
Eosinophils Absolute: 0.1 10*3/uL (ref 0.0–0.5)
Eosinophils Relative: 1 %
HCT: 19.4 % — ABNORMAL LOW (ref 39.0–52.0)
Hemoglobin: 6.6 g/dL — CL (ref 13.0–17.0)
Immature Granulocytes: 0 %
Lymphocytes Relative: 49 %
Lymphs Abs: 3.8 10*3/uL (ref 0.7–4.0)
MCH: 39.8 pg — ABNORMAL HIGH (ref 26.0–34.0)
MCHC: 34 g/dL (ref 30.0–36.0)
MCV: 116.9 fL — ABNORMAL HIGH (ref 80.0–100.0)
Monocytes Absolute: 0.4 10*3/uL (ref 0.1–1.0)
Monocytes Relative: 5 %
Neutro Abs: 3.5 10*3/uL (ref 1.7–7.7)
Neutrophils Relative %: 45 %
Platelets: 316 10*3/uL (ref 150–400)
RBC: 1.66 MIL/uL — ABNORMAL LOW (ref 4.22–5.81)
RDW: 26.9 % — ABNORMAL HIGH (ref 11.5–15.5)
WBC: 7.9 10*3/uL (ref 4.0–10.5)
nRBC: 0.6 % — ABNORMAL HIGH (ref 0.0–0.2)

## 2023-05-09 NOTE — Discharge Summary (Signed)
 Physician Discharge Summary  [name] [name]. ZOX:[id number] DOB: 08-Apr-1980 DOA: 05/08/2023  PCP: Ivonne Andrew, NP  Admit date: 05/08/2023  Discharge date: 05/09/2023  Discharge Diagnoses:  Principal Problem:   Sickle cell anemia with pain (HCC) Active Problems:   CKD (chronic kidney disease), stage IV (HCC)   Sickle cell pain crisis (HCC)   Dizzy   Hypokalemia   Discharge Condition: Stable  Disposition:  Pt is discharged home in good condition and is to follow up with Ivonne Andrew, NP this week to have labs evaluated. [name] A Sangiovanni Jr. is instructed to increase activity slowly and balance with rest for the next few days, and use prescribed medication to complete treatment of pain  Diet: Regular Wt Readings from Last 3 Encounters:  04/28/23 56.7 kg  04/26/23 56.5 kg  03/22/23 52.2 kg    History of present illness:  [name] [name]. is a 43 y.o. male with history of sickle cell disease, chronic pain syndrome, opiate dependence and tolerance, anemia of chronic disease, and CKD stage IV who presented to the day hospital today with major complaints of generalized body pain but primarily in his head and lower extremities. Patient claims he has had 1 fall in the last week, he sustained cuts and bruises on his right knee during these falls, no inciting factors. Now he is in severe pain, rated at 10/10, generalized but mostly in the right knee areas. Patients right arm is also swollen. He describes this pain as constant and achy and throbbing. He took his home medications with no sustained relief. He is also reporting dizziness. Worse when he sits up. He denies any fever, headache, cough, blurry vision, chest pain, shortness of breath, nausea, vomiting or diarrhea. No urinary symptoms. He denies any recent travels, sick contacts    Hospital Course:  Patient was admitted for sickle cell pain crisis and managed appropriately with IVF, IV Dilaudid via PCA as well as  other adjunct therapies per sickle cell pain management protocols.Patient completed CT scan to rule out cause of recent falls and increase dizziness. CT results showed no   1.No acute intracranial abnormality. 2. A tiny chronic appearing left cerebellar infarct is new since 2022. 3. Otherwise Stable non contrast CT appearance of the brain, including probable chronic/congenital arachnoid cyst at the left insula, operculum. Patient responded well to treatment and reports significant pain control back to his base line. HGB is low however not low enough for trans fusion. Patient will follow up on Friday for a repeat H&H. Patient denies dizziness this AM and reports that he is ready to go home.  Patient was therefore discharged home today in a hemodynamically stable condition.   Aniket will follow-up with PCP within 1 week of this discharge. Lot was counseled extensively about nonpharmacologic means of pain management, patient verbalized understanding and was appreciative of  the care received during this admission.   We discussed the need for good hydration, monitoring of hydration status, avoidance of heat, cold, stress, and infection triggers. We discussed the need to be adherent with taking Hydrea and other home medications. Patient was reminded of the need to seek medical attention immediately if any symptom of bleeding, anemia, or infection occurs.  Discharge Exam: Vitals:   05/09/23 1204 05/09/23 1228  BP:  (!) 128/90  Pulse:  86  Resp: 14 16  Temp:  98.2 F (36.8 C)  SpO2:  (!) 89%   Vitals:   05/09/23 0416 05/09/23 0806 05/09/23 1204  05/09/23 1228  BP:    (!) 128/90  Pulse:    86  Resp: 10 18 14 16   Temp:    98.2 F (36.8 C)  TempSrc:    Oral  SpO2: 99%   (!) 89%    General appearance : Awake, alert, not in any distress. Speech Clear. Not toxic looking HEENT: Atraumatic and Normocephalic, pupils equally reactive to light and accomodation Neck: Supple, no JVD. No cervical  lymphadenopathy.  Chest: Good air entry bilaterally, no added sounds  CVS: S1 S2 regular, no murmurs.  Abdomen: Bowel sounds present, Non tender and not distended with no gaurding, rigidity or rebound. Extremities: B/L Lower Ext shows no edema, both legs are warm to touch Neurology: Awake alert, and oriented X 3, CN II-XII intact, Non focal Skin: No Rash  Discharge Instructions  Discharge Instructions     Diet - low sodium heart healthy   Complete by: As directed    Increase activity slowly   Complete by: As directed    Increase activity slowly   Complete by: As directed       Allergies as of 05/09/2023       Reactions   Nsaids Other (See Comments)   Kidney disease   Morphine And Codeine Other (See Comments)   "Really bad headaches"         Medication List     TAKE these medications    amitriptyline 25 MG tablet Commonly known as: ELAVIL Take 2 tablets (50 mg total) by mouth at bedtime. What changed: how much to take   calcitRIOL 0.25 MCG capsule Commonly known as: ROCALTROL Take 0.25 mcg by mouth daily with breakfast.   Ferriprox Twice-A-Day 1000 MG Tabs Generic drug: Deferiprone (Twice Daily) Take 3,000 mg by mouth daily.   folic acid 1 MG tablet Commonly known as: FOLVITE TAKE 1 TABLET BY MOUTH EVERY DAY   hydroxyurea 500 MG capsule Commonly known as: HYDREA Take 1 capsule by mouth in the morning on Sun/Tues/Wed/Thurs/Sat and 2 capsules on Mon/Fri   oxyCODONE 15 mg 12 hr tablet Commonly known as: OXYCONTIN Take 1 tablet (15 mg) by mouth every 12 hours.   Oxycodone HCl 10 MG Tabs Take 1 tablet (10 mg total) by mouth See admin instructions. Take 10 mg by mouth every 6 hours   sodium bicarbonate 650 MG tablet Take 1,950 mg by mouth with breakfast, with lunch, and with evening meal.   sodium zirconium cyclosilicate 10 g Pack packet Commonly known as: LOKELMA Take 20 g by mouth daily.   Tylenol 8 Hour Arthritis Pain 650 MG CR tablet Generic drug:  acetaminophen Take 650 mg by mouth every 8 (eight) hours as needed for pain.        The results of significant diagnostics from this hospitalization (including imaging, microbiology, ancillary and laboratory) are listed below for reference.    Significant Diagnostic Studies: CT HEAD WO CONTRAST ( ) Result Date: 05/09/2023 CLINICAL DATA:  44 year old male with recurrent falls. Dizziness. Ataxia. Sickle cell disease. EXAM: CT HEAD WITHOUT CONTRAST TECHNIQUE: Contiguous axial images were obtained from the base of the skull through the vertex without intravenous contrast. RADIATION DOSE REDUCTION: This exam was performed according to the departmental dose-optimization program which includes automated exposure control, adjustment of the mA and/or kV according to patient size and/or use of iterative reconstruction technique. COMPARISON:  Brain MRI 02/22/2007.  Head CT 03/23/2020. FINDINGS: Brain: Left insula, sylvian fissure arachnoid cyst (favored) versus chronic encephalomalacia is unchanged since at least 2009.  Stable cerebral volume. No midline shift, ventriculomegaly, mass effect, evidence of mass lesion, intracranial hemorrhage or evidence of cortically based acute infarction. Tiny chronic left cerebellar lacunar infarct series 4 image 60, not apparent previously. Otherwise stable gray-white differentiation. Vascular: No suspicious intracranial vascular hyperdensity. Skull: Stable, intact. Sinuses/Orbits: Substantially improved paranasal sinus aeration since 2022. Minimal fluid or mucosal thickening in the left sphenoid. Hyperplastic mastoids, petrous and sphenoid wing air cells, tympanic cavities are clear. Other: Disconjugate gaze and motion artifact of the globes. Visualized scalp soft tissues are within normal limits. IMPRESSION: 1. No acute intracranial abnormality. 2. A tiny chronic appearing left cerebellar infarct is new since 2022. 3. Otherwise Stable non contrast CT appearance of the brain,  including probable chronic/congenital arachnoid cyst at the left insula, operculum. Electronically Signed   By: Odessa Fleming M.D.   On: 05/09/2023 11:00    Microbiology: No results found for this or any previous visit (from the past 240 hours).   Labs: Basic Metabolic Panel: Recent Labs  Lab 05/08/23 0951  NA 142  K 3.3*  CL 101  CO2 31  GLUCOSE 86  BUN 33*  CREATININE 2.85*  CALCIUM 8.3*   Liver Function Tests: Recent Labs  Lab 05/08/23 0951  AST 46*  ALT 27  ALKPHOS 185*  BILITOT 0.8  PROT 6.4*  ALBUMIN 2.5*   No results for input(s): "LIPASE", "AMYLASE" in the last 168 hours. No results for input(s): "AMMONIA" in the last 168 hours. CBC: Recent Labs  Lab 05/08/23 0951 05/09/23 0454  WBC 8.6 7.9  NEUTROABS 3.8 3.5  HGB 6.7* 6.6*  HCT 20.1* 19.4*  MCV 116.9* 116.9*  PLT 350 316   Cardiac Enzymes: No results for input(s): "CKTOTAL", "CKMB", "CKMBINDEX", "TROPONINI" in the last 168 hours. BNP: Invalid input(s): "POCBNP" CBG: No results for input(s): "GLUCAP" in the last 168 hours.  Time coordinating discharge: 50 minutes  Signed:  Daryll Drown NP   [facility] 05/09/2023, 2:04 PM

## 2023-05-09 NOTE — TOC CM/SW Note (Signed)
 Transition of Care [facility]) - Inpatient Brief Assessment   Patient Details  Name: [name] [name]. MRN: [id number] Date of Birth: 1980/09/20  Transition of Care [facility]) CM/SW Contact:    Jessie Foot, RN Phone Number: 05/09/2023, 8:17 AM   Clinical Narrative: No TOC needs identified.    Transition of Care Asessment: Insurance and Status: Insurance coverage has been reviewed Patient has primary care physician: Yes Home environment has been reviewed: Lives alone in an apartment Prior level of function:: Independent Prior/Current Home Services: No current home services Social Drivers of Health Review: SDOH reviewed no interventions necessary Readmission risk has been reviewed: Yes Transition of care needs: no transition of care needs at this time

## 2023-05-09 NOTE — Care Management CC44 (Signed)
 Condition Code 44 Documentation Completed  Patient Details  Name: [name] [name]. MRN: [id number] Date of Birth: Jun 27, 1980   Condition Code 44 given:  Yes Patient signature on Condition Code 44 notice:  Yes Documentation of 2 MD's agreement:  Yes Code 44 added to claim:  Yes    Jessie Foot, RN 05/09/2023, 2:55 PM

## 2023-05-09 NOTE — Telephone Encounter (Signed)
 Duplicate refill request for oxycodone. Oxycodone new prescription written yesterday by FNP [place]. Closing encounter.

## 2023-05-09 NOTE — Plan of Care (Signed)
  Problem: Education: Goal: Knowledge of vaso-occlusive preventative measures will improve Outcome: Adequate for Discharge Goal: Awareness of infection prevention will improve Outcome: Adequate for Discharge Goal: Awareness of signs and symptoms of anemia will improve Outcome: Adequate for Discharge Goal: Long-term complications will improve Outcome: Adequate for Discharge   Problem: Self-Care: Goal: Ability to incorporate actions that prevent/reduce pain crisis will improve Outcome: Adequate for Discharge   Problem: Bowel/Gastric: Goal: Gut motility will be maintained Outcome: Adequate for Discharge   Problem: Tissue Perfusion: Goal: Complications related to inadequate tissue perfusion will be avoided or minimized Outcome: Adequate for Discharge   Problem: Respiratory: Goal: Pulmonary complications will be avoided or minimized Outcome: Adequate for Discharge Goal: Acute Chest Syndrome will be identified early to prevent complications Outcome: Adequate for Discharge   Problem: Fluid Volume: Goal: Ability to maintain a balanced intake and output will improve Outcome: Adequate for Discharge   Problem: Sensory: Goal: Pain level will decrease with appropriate interventions Outcome: Adequate for Discharge   Problem: Health Behavior: Goal: Postive changes in compliance with treatment and prescription regimens will improve Outcome: Adequate for Discharge   Problem: Education: Goal: Knowledge of General Education information will improve Description: Including pain rating scale, medication(s)/side effects and non-pharmacologic comfort measures Outcome: Adequate for Discharge   Problem: Health Behavior/Discharge Planning: Goal: Ability to manage health-related needs will improve Outcome: Adequate for Discharge   Problem: Clinical Measurements: Goal: Ability to maintain clinical measurements within normal limits will improve Outcome: Adequate for Discharge Goal: Will remain  free from infection Outcome: Adequate for Discharge Goal: Diagnostic test results will improve Outcome: Adequate for Discharge Goal: Respiratory complications will improve Outcome: Adequate for Discharge Goal: Cardiovascular complication will be avoided Outcome: Adequate for Discharge   Problem: Activity: Goal: Risk for activity intolerance will decrease Outcome: Adequate for Discharge   Problem: Nutrition: Goal: Adequate nutrition will be maintained Outcome: Adequate for Discharge   Problem: Coping: Goal: Level of anxiety will decrease Outcome: Adequate for Discharge   Problem: Elimination: Goal: Will not experience complications related to bowel motility Outcome: Adequate for Discharge Goal: Will not experience complications related to urinary retention Outcome: Adequate for Discharge   Problem: Pain Managment: Goal: General experience of comfort will improve and/or be controlled Outcome: Adequate for Discharge   Problem: Safety: Goal: Ability to remain free from injury will improve Outcome: Adequate for Discharge   Problem: Skin Integrity: Goal: Risk for impaired skin integrity will decrease Outcome: Adequate for Discharge

## 2023-05-09 NOTE — TOC Progression Note (Signed)
 Transition of Care (TOC) - Progression Note    Patient Details  Name: [name] [name]. MRN: [id number] Date of Birth: [birth year]  Transition of Care [facility]) CM/SW Contact  Jessie Foot, RN Phone Number: 05/09/2023, 2:58 PM  Clinical Narrative:    CODE 44 completed        Expected Discharge Plan and Services         Expected Discharge Date: 05/09/23                                     Social Determinants of Health (SDOH) Interventions SDOH Screenings   Food Insecurity: No Food Insecurity (05/08/2023)  Housing: Low Risk  (05/08/2023)  Transportation Needs: No Transportation Needs (05/08/2023)  Utilities: Not At Risk (05/08/2023)  Alcohol Screen: Low Risk  (02/22/2023)  Depression (PHQ2-9): Low Risk  (02/22/2023)  Financial Resource Strain: Medium Risk (02/22/2023)  Physical Activity: Insufficiently Active (02/22/2023)  Social Connections: Moderately Isolated (02/22/2023)  Stress: No Stress Concern Present (02/22/2023)  Tobacco Use: Medium Risk (04/27/2023)  Health Literacy: Adequate Health Literacy (02/22/2023)    Readmission Risk Interventions    05/09/2023    8:15 AM 12/27/2021   11:17 AM 10/12/2021   10:41 AM  Readmission Risk Prevention Plan  Transportation Screening Complete Complete Complete  Medication Review [employer]) Complete Complete Complete  PCP or Specialist appointment within 3-5 days of discharge Complete Complete Complete  HRI or Home Care Consult Complete Complete Complete  SW Recovery Care/Counseling Consult Complete Complete Complete  Palliative Care Screening Not Applicable Not Applicable Not Applicable  Skilled Nursing Facility Not Applicable Not Applicable Not Applicable

## 2023-05-09 NOTE — Plan of Care (Signed)

## 2023-05-09 NOTE — Care Management Obs Status (Signed)
 MEDICARE OBSERVATION STATUS NOTIFICATION   Patient Details  Name: [name] [name]. MRN: [id number] Date of Birth: 26-Oct-1980   Medicare Observation Status Notification Given:  Yes    Jessie Foot, RN 05/09/2023, 2:54 PM

## 2023-05-11 ENCOUNTER — Other Ambulatory Visit: Payer: Self-pay

## 2023-05-15 ENCOUNTER — Ambulatory Visit: Payer: Self-pay | Admitting: Internal Medicine

## 2023-05-16 ENCOUNTER — Other Ambulatory Visit

## 2023-05-16 ENCOUNTER — Other Ambulatory Visit: Payer: Self-pay

## 2023-05-16 DIAGNOSIS — D5700 Hb-SS disease with crisis, unspecified: Secondary | ICD-10-CM

## 2023-05-16 DIAGNOSIS — D571 Sickle-cell disease without crisis: Secondary | ICD-10-CM

## 2023-05-16 DIAGNOSIS — G894 Chronic pain syndrome: Secondary | ICD-10-CM

## 2023-05-16 DIAGNOSIS — F1190 Opioid use, unspecified, uncomplicated: Secondary | ICD-10-CM

## 2023-05-17 LAB — CMP14+CBC/D/PLT+FER+RETIC+V...
ALT: 43 IU/L (ref 0–44)
AST: 57 IU/L — ABNORMAL HIGH (ref 0–40)
Albumin: 3.3 g/dL — ABNORMAL LOW (ref 4.1–5.1)
Alkaline Phosphatase: 316 IU/L — ABNORMAL HIGH (ref 44–121)
BUN/Creatinine Ratio: 14 (ref 9–20)
BUN: 35 mg/dL — ABNORMAL HIGH (ref 6–24)
Basophils Absolute: 0 10*3/uL (ref 0.0–0.2)
Basos: 0 %
Bilirubin Total: 0.5 mg/dL (ref 0.0–1.2)
CO2: 21 mmol/L (ref 20–29)
Calcium: 8.5 mg/dL — ABNORMAL LOW (ref 8.7–10.2)
Chloride: 103 mmol/L (ref 96–106)
Creatinine, Ser: 2.58 mg/dL — ABNORMAL HIGH (ref 0.76–1.27)
EOS (ABSOLUTE): 0.1 10*3/uL (ref 0.0–0.4)
Eos: 1 %
Ferritin: 4335 ng/mL — ABNORMAL HIGH (ref 30–400)
Globulin, Total: 3 g/dL (ref 1.5–4.5)
Glucose: 79 mg/dL (ref 70–99)
Hematocrit: 22.5 % — ABNORMAL LOW (ref 37.5–51.0)
Hemoglobin: 7.6 g/dL — ABNORMAL LOW (ref 13.0–17.7)
Immature Grans (Abs): 0 10*3/uL (ref 0.0–0.1)
Immature Granulocytes: 0 %
Lymphocytes Absolute: 4.4 10*3/uL — ABNORMAL HIGH (ref 0.7–3.1)
Lymphs: 56 %
MCH: 39.2 pg — ABNORMAL HIGH (ref 26.6–33.0)
MCHC: 33.8 g/dL (ref 31.5–35.7)
MCV: 116 fL — ABNORMAL HIGH (ref 79–97)
Monocytes Absolute: 0.4 10*3/uL (ref 0.1–0.9)
Monocytes: 4 %
NRBC: 1 % — ABNORMAL HIGH (ref 0–0)
Neutrophils Absolute: 3.1 10*3/uL (ref 1.4–7.0)
Neutrophils: 39 %
Platelets: 300 10*3/uL (ref 150–450)
Potassium: 4.2 mmol/L (ref 3.5–5.2)
RBC: 1.94 x10E6/uL — CL (ref 4.14–5.80)
Retic Ct Pct: 6.1 % — ABNORMAL HIGH (ref 0.6–2.6)
Sodium: 139 mmol/L (ref 134–144)
Total Protein: 6.3 g/dL (ref 6.0–8.5)
Vit D, 25-Hydroxy: 12.6 ng/mL — ABNORMAL LOW (ref 30.0–100.0)
WBC: 8 10*3/uL (ref 3.4–10.8)
eGFR: 31 mL/min/{1.73_m2} — ABNORMAL LOW (ref >59)

## 2023-05-21 ENCOUNTER — Telehealth (HOSPITAL_COMMUNITY): Payer: Self-pay | Admitting: *Deleted

## 2023-05-21 ENCOUNTER — Telehealth: Payer: Self-pay

## 2023-05-21 ENCOUNTER — Ambulatory Visit: Payer: Self-pay | Admitting: Nurse Practitioner

## 2023-05-21 ENCOUNTER — Non-Acute Institutional Stay (HOSPITAL_COMMUNITY)
Admission: AD | Admit: 2023-05-21 | Discharge: 2023-05-21 | Disposition: A | Discharge status: Home / Self Care | Source: Ambulatory Visit | Attending: Internal Medicine | Admitting: Internal Medicine

## 2023-05-21 ENCOUNTER — Other Ambulatory Visit: Payer: Self-pay | Admitting: Nurse Practitioner

## 2023-05-21 DIAGNOSIS — G894 Chronic pain syndrome: Secondary | ICD-10-CM

## 2023-05-21 DIAGNOSIS — Z79899 Other long term (current) drug therapy: Secondary | ICD-10-CM | POA: Insufficient documentation

## 2023-05-21 DIAGNOSIS — D631 Anemia in chronic kidney disease: Secondary | ICD-10-CM | POA: Diagnosis not present

## 2023-05-21 DIAGNOSIS — F1120 Opioid dependence, uncomplicated: Secondary | ICD-10-CM | POA: Diagnosis not present

## 2023-05-21 DIAGNOSIS — D571 Sickle-cell disease without crisis: Secondary | ICD-10-CM

## 2023-05-21 DIAGNOSIS — N184 Chronic kidney disease, stage 4 (severe): Secondary | ICD-10-CM | POA: Insufficient documentation

## 2023-05-21 DIAGNOSIS — D5700 Hb-SS disease with crisis, unspecified: Secondary | ICD-10-CM | POA: Diagnosis present

## 2023-05-21 LAB — CBC WITH DIFFERENTIAL/PLATELET
Abs Immature Granulocytes: 0.04 10*3/uL (ref 0.00–0.07)
Basophils Absolute: 0 10*3/uL (ref 0.0–0.1)
Basophils Relative: 0 %
Eosinophils Absolute: 0.1 10*3/uL (ref 0.0–0.5)
Eosinophils Relative: 1 %
HCT: 24.1 % — ABNORMAL LOW (ref 39.0–52.0)
Hemoglobin: 8.2 g/dL — ABNORMAL LOW (ref 13.0–17.0)
Immature Granulocytes: 0 %
Lymphocytes Relative: 45 %
Lymphs Abs: 4.3 10*3/uL — ABNORMAL HIGH (ref 0.7–4.0)
MCH: 40.2 pg — ABNORMAL HIGH (ref 26.0–34.0)
MCHC: 34 g/dL (ref 30.0–36.0)
MCV: 118.1 fL — ABNORMAL HIGH (ref 80.0–100.0)
Monocytes Absolute: 0.4 10*3/uL (ref 0.1–1.0)
Monocytes Relative: 5 %
Neutro Abs: 4.7 10*3/uL (ref 1.7–7.7)
Neutrophils Relative %: 49 %
Platelets: 312 10*3/uL (ref 150–400)
RBC: 2.04 MIL/uL — ABNORMAL LOW (ref 4.22–5.81)
RDW: 24.6 % — ABNORMAL HIGH (ref 11.5–15.5)
WBC: 9.5 10*3/uL (ref 4.0–10.5)
nRBC: 0.4 % — ABNORMAL HIGH (ref 0.0–0.2)

## 2023-05-21 LAB — COMPREHENSIVE METABOLIC PANEL WITH GFR
ALT: 32 U/L (ref 0–44)
AST: 40 U/L (ref 15–41)
Albumin: 2.9 g/dL — ABNORMAL LOW (ref 3.5–5.0)
Alkaline Phosphatase: 236 U/L — ABNORMAL HIGH (ref 38–126)
Anion gap: 10 (ref 5–15)
BUN: 35 mg/dL — ABNORMAL HIGH (ref 6–20)
CO2: 20 mmol/L — ABNORMAL LOW (ref 22–32)
Calcium: 8.5 mg/dL — ABNORMAL LOW (ref 8.9–10.3)
Chloride: 108 mmol/L (ref 98–111)
Creatinine, Ser: 2.98 mg/dL — ABNORMAL HIGH (ref 0.61–1.24)
GFR, Estimated: 26 mL/min — ABNORMAL LOW (ref >60)
Glucose, Bld: 82 mg/dL (ref 70–99)
Potassium: 3.6 mmol/L (ref 3.5–5.1)
Sodium: 138 mmol/L (ref 135–145)
Total Bilirubin: 1.6 mg/dL — ABNORMAL HIGH (ref 0.0–1.2)
Total Protein: 7 g/dL (ref 6.5–8.1)

## 2023-05-21 LAB — RETICULOCYTES
Immature Retic Fract: 25.3 % — ABNORMAL HIGH (ref 2.3–15.9)
RBC.: 1.98 MIL/uL — ABNORMAL LOW (ref 4.22–5.81)
Retic Count, Absolute: 87.5 10*3/uL (ref 19.0–186.0)
Retic Ct Pct: 4.4 % — ABNORMAL HIGH (ref 0.4–3.1)

## 2023-05-21 LAB — LACTATE DEHYDROGENASE: LDH: 135 U/L (ref 98–192)

## 2023-05-21 MED ORDER — DIPHENHYDRAMINE HCL 25 MG PO CAPS
25.0000 mg | ORAL_CAPSULE | ORAL | Status: DC | PRN
  Administered 2023-05-21: 25 mg via ORAL
  Filled 2023-05-21 (×2): qty 1

## 2023-05-21 MED ORDER — OXYCODONE HCL 10 MG PO TABS: 10.0000 mg | ORAL_TABLET | ORAL | 0 refills | Status: DC

## 2023-05-21 MED ORDER — SODIUM CHLORIDE 0.9% FLUSH: 9.0000 mL | INTRAVENOUS | Status: DC | PRN

## 2023-05-21 MED ORDER — NALOXONE HCL 0.4 MG/ML IJ SOLN: 0.4000 mg | INTRAMUSCULAR | Status: DC | PRN

## 2023-05-21 MED ORDER — SODIUM CHLORIDE 0.45 % IV SOLN: INTRAVENOUS | Status: DC

## 2023-05-21 MED ORDER — POLYETHYLENE GLYCOL 3350 17 G PO PACK: 17.0000 g | PACK | Freq: Every day | ORAL | Status: DC | PRN

## 2023-05-21 MED ORDER — HYDROMORPHONE 1 MG/ML IV SOLN
INTRAVENOUS | Status: DC
  Administered 2023-05-21: 30 mg via INTRAVENOUS
  Administered 2023-05-21: 4.5 mg via INTRAVENOUS
  Filled 2023-05-21: qty 30

## 2023-05-21 MED ORDER — SENNOSIDES-DOCUSATE SODIUM 8.6-50 MG PO TABS
1.0000 | ORAL_TABLET | Freq: Two times a day (BID) | ORAL | Status: DC
  Filled 2023-05-21: qty 1

## 2023-05-21 MED ORDER — ONDANSETRON HCL 4 MG/2ML IJ SOLN
4.0000 mg | Freq: Four times a day (QID) | INTRAMUSCULAR | Status: DC | PRN
  Administered 2023-05-21: 4 mg via INTRAVENOUS
  Filled 2023-05-21: qty 2

## 2023-05-21 MED ORDER — ACETAMINOPHEN 500 MG PO TABS
1000.0000 mg | ORAL_TABLET | Freq: Once | ORAL | Status: AC
  Administered 2023-05-21: 1000 mg via ORAL
  Filled 2023-05-21: qty 2

## 2023-05-21 NOTE — Telephone Encounter (Signed)
 Patient called requesting to come to the day hospital for sickle cell pain. Patient reports lower back pain rated 10/10. Reports taking Oxycodone 10 mg and Oxycontin 15 mg at 6:00 am. COVID-19 screening done and patient denies all symptoms and exposures. Denies fever, chest pain, nausea, vomiting, diarrhea, abdominal pain and priapism. Admits to having transportation without driving self. [name], NP notified and advised that patient can come to the day hospital for pain management. Patient advised and expresses an understanding.

## 2023-05-21 NOTE — Discharge Summary (Signed)
 Physician Discharge Summary  [name] [name]. IRJ:[id number] DOB: [birth year] DOA: 05/21/2023  PCP: Ivonne Andrew, NP  Admit date: 05/21/2023  Discharge date: 05/21/2023  Time spent: 30 minutes  Discharge Diagnoses:  Principal Problem:   Sickle cell anemia with pain [facility])   Discharge Condition: Stable  Diet recommendation: Regular  History of present illness:  [name] [name]. is a 43 y.o. male with history of sickle cell disease. Chronic pain syndrome, opiate dependence and tolerance, anemia of chronic disease, and CKD stage IV who presented to the day hospital today with major complaints of generalized body pain but primarily in his lower extremities and back, rates pain  at 9/10. He describes this pain as constant and achy and throbbing. He took his home medications with no sustained relief. He denies any fever, headache, cough, blurry vision, chest pain, shortness of breath, nausea, vomiting or diarrhea. No urinary symptoms. He denies any recent travels, sick contacts   Hospital Course:  [name] [name]. was admitted to the day hospital with sickle cell painful crisis. Patient was treated with IV fluid, weight based IV Dilaudid PCA, IV Toradol, clinician assisted doses as deemed appropriate, and other adjunct therapies per sickle cell pain management protocol. [name] [name] showed significant improvement symptomatically, pain improved from 10/10 to 6/10 at the time of discharge. Patient was discharged home in a hemodynamically stable condition. [name] [name] will follow-up at the clinic as previously scheduled, continue with home medications as per prior to admission.  Discharge Instructions We discussed the need for good hydration, monitoring of hydration status, avoidance of heat, cold, stress, and infection triggers. We discussed the need to be compliant with taking other home medications. [name] [name] was reminded of the need to seek medical attention immediately if any symptom of  bleeding, anemia, or infection occurs.  Discharge Exam: Vitals:   05/21/23 1531 05/21/23 1557  BP: (!) 133/93   Pulse: 95   Resp: 12 12  Temp:    SpO2: 99%     General appearance: alert, cooperative and no distress Eyes: conjunctivae/corneas clear. PERRL, EOM's intact. Fundi benign. Neck: no adenopathy, no carotid bruit, no JVD, supple, symmetrical, trachea midline and thyroid not enlarged, symmetric, no tenderness/mass/nodules Back: symmetric, no curvature. ROM normal. No CVA tenderness. Resp: clear to auscultation bilaterally Chest wall: no tenderness Cardio: regular rate and rhythm, S1, S2 normal, no murmur, click, rub or gallop GI: soft, non-tender; bowel sounds normal; no masses,  no organomegaly Extremities: extremities normal, atraumatic, no cyanosis or edema Pulses: 2+ and symmetric Skin: Skin color, texture, turgor normal. No rashes or lesions Neurologic: Grossly normal  Discharge Instructions     Diet - low sodium heart healthy   Complete by: As directed    Increase activity slowly   Complete by: As directed        Allergies  Allergen Reactions   Nsaids Other (See Comments)    Kidney disease   Morphine And Codeine Other (See Comments)    "Really bad headaches"      Significant Diagnostic Studies: CT HEAD WO CONTRAST ( ) Result Date: 05/09/2023 CLINICAL DATA:  43 year old male with recurrent falls. Dizziness. Ataxia. Sickle cell disease. EXAM: CT HEAD WITHOUT CONTRAST TECHNIQUE: Contiguous axial images were obtained from the base of the skull through the vertex without intravenous contrast. RADIATION DOSE REDUCTION: This exam was performed according to the departmental dose-optimization program which includes automated exposure control, adjustment of the mA and/or kV according to patient size and/or use of iterative reconstruction technique.  COMPARISON:  Brain MRI 02/22/2007.  Head CT 03/23/2020. FINDINGS: Brain: Left insula, sylvian fissure arachnoid cyst  (favored) versus chronic encephalomalacia is unchanged since at least 2009. Stable cerebral volume. No midline shift, ventriculomegaly, mass effect, evidence of mass lesion, intracranial hemorrhage or evidence of cortically based acute infarction. Tiny chronic left cerebellar lacunar infarct series 4 image 60, not apparent previously. Otherwise stable gray-white differentiation. Vascular: No suspicious intracranial vascular hyperdensity. Skull: Stable, intact. Sinuses/Orbits: Substantially improved paranasal sinus aeration since 2022. Minimal fluid or mucosal thickening in the left sphenoid. Hyperplastic mastoids, petrous and sphenoid wing air cells, tympanic cavities are clear. Other: Disconjugate gaze and motion artifact of the globes. Visualized scalp soft tissues are within normal limits. IMPRESSION: 1. No acute intracranial abnormality. 2. A tiny chronic appearing left cerebellar infarct is new since 2022. 3. Otherwise Stable non contrast CT appearance of the brain, including probable chronic/congenital arachnoid cyst at the left insula, operculum. Electronically Signed   By: Odessa Fleming M.D.   On: 05/09/2023 11:00    Signed:  Daryll Drown NP  05/21/2023, 4:48 PM

## 2023-05-21 NOTE — Telephone Encounter (Signed)
 Copied from CRM [id number]. Topic: Clinical - Red Word Triage >> May 21, 2023  8:04 AM Fonda Kinder J wrote: [facility] that prompted transfer to Nurse Triage: Severe pain in lower back   Chief Complaint: Back pain Symptoms: 10/10 low back pain Frequency: constant Pertinent Negatives: Patient denies fever Disposition: [] ED /[x] Urgent Care (no appt availability in office) / [] Appointment(In office/virtual)/ []  [place] [facility]/ [] Home Care/ [] Refused Recommended Disposition /[] [place] [facility]/ []  Follow-up with PCP Additional Notes: transferred to Sickle Cell day hospital line  Reason for Disposition  [1] SEVERE sickle cell pain (e.g., pain episode, sickle cell attack) AND [2] has pain management plan AND [3] NOT better after taking pain medicine per plan  Answer Assessment - Initial Assessment Questions 1. ONSET: "When did the pain begin?"      Started 3 days ago  2. LOCATION: "Where does it hurt?" (upper, mid or lower back)     Lower back pain, across whole lower back  3. SEVERITY: "How bad is the pain?"  (e.g., Scale 1-10; mild, moderate, or severe)   - MILD (1-3): Doesn't interfere with normal activities.    - MODERATE (4-7): Interferes with normal activities or awakens from sleep.    - SEVERE (8-10): Excruciating pain, unable to do any normal activities.      10/10 pain  4. PATTERN: "Is the pain constant?" (e.g., yes, no; constant, intermittent)      Constant  5. RADIATION: "Does the pain shoot into your legs or somewhere else?"     No   6. CAUSE:  "What do you think is causing the back pain?"      Unsure, afraid this could be a crisis  7. BACK OVERUSE:  "Any recent lifting of heavy objects, strenuous work or exercise?"     No  8. MEDICINES: "What have you taken so far for the pain?" (e.g., nothing, acetaminophen, NSAIDS)     Oxycodone 10mg ; Oxycontin 15mg   9. NEUROLOGIC SYMPTOMS: "Do you have any weakness, numbness, or problems with bowel/bladder control?"      No  10. OTHER SYMPTOMS: "Do you have any other symptoms?" (e.g., fever, abdomen pain, burning with urination, blood in urine)       Dark urine  11. PREGNANCY: "Is there any chance you are pregnant?" "When was your last menstrual period?"       No  Answer Assessment - Initial Assessment Questions 1. ONSET: "When did the pain begin?"      2 days ago  2. LOCATION: "Where does it hurt?"      Low back   3. SEVERITY: "How bad is the pain?"  (e.g., Scale 1-10; mild, moderate, or severe)   - MILD (1-3): doesn't interfere with normal activities    - MODERATE (4-7): interferes with normal activities or awakens from sleep    - SEVERE (8-10): excruciating pain, unable to do any normal activities      10/10 pain  4. PATTERN: "Is the pain constant?" (e.g., yes, no; constant, intermittent)      Constant  5. CAUSE:  What do you think is causing the pain? Is this pain similar to the acute pain attacks you have had in the past? (e.g., similar location, quality, intensity)     Sickle cell  6. PAIN MEDICINES:    - "Do you have a plan from your doctor for treating your acute pain episodes?" (e.g., pain management plan)   - "What have you taken so far for the pain?" (e.g.,  nothing, acetaminophen, NSAIDS, narcotic pain medicine).    - "How much has this helped relieve the pain?"    - "Have you done anything else to help the pain?" (e.g., heating pad)     Oxycodone and Oxycontin  7. OTHER SYMPTOMS: "Do you have any other symptoms?" (e.g., fever, chest pain, shortness of breath, new areas of swelling or redness)     Dark urine  8. PREGNANCY: "Is there any chance you are pregnant?" "When was your last menstrual period?"     N/a  Protocols used: Back Pain-A-AH, Sickle Cell Disease - Acute Pain Episode (Crisis)-A-AH

## 2023-05-21 NOTE — Progress Notes (Signed)
 Pt admitted to the day hospital by Lynnell Chad FNP for treatment of sickle cell pain crisis. Pt reported pain to back rated a 10/10. PIV placed by IV team. Pt given PO Benadryl and Tylenol,placed on Dilaudid PCA 0.5/10/3, and hydrated with 1/2 NS IV fluids. Request for home pain medication refills addressed at visit.  At discharge patient reported pain a 6/10. Pt declined AVS.  Pt alert , oriented and ambulatory at discharge.

## 2023-05-21 NOTE — H&P (Signed)
 [facility] History and Physical  [name] [name]. ZOX:[id number] DOB: 1980/05/25 DOA: 05/21/2023  PCP: Ivonne Andrew, NP   Chief Complaint:  Chief Complaint  Patient presents with   Sickle Cell Pain Crisis    HPI: [name] [name]. is a 43 y.o. male with history of sickle cell disease. Chronic pain syndrome, opiate dependence and tolerance, anemia of chronic disease, and CKD stage IV who presented to the day hospital today with major complaints of generalized body pain but primarily in his lower extremities and back, rates pain  at 9/10. He describes this pain as constant and achy and throbbing. He took his home medications with no sustained relief. He denies any fever, headache, cough, blurry vision, chest pain, shortness of breath, nausea, vomiting or diarrhea. No urinary symptoms. He denies any recent travels, sick contacts   Systemic Review: General: The patient denies anorexia, fever, weight loss Cardiac: Denies chest pain, syncope, palpitations, pedal edema  Respiratory: Denies cough, shortness of breath, wheezing GI: Denies severe indigestion/heartburn, abdominal pain, nausea, vomiting, diarrhea and constipation GU: Denies hematuria, incontinence, dysuria  Musculoskeletal: bilateral lower extremity and back pain  Skin: Denies suspicious skin lesions Neurologic: Denies focal weakness or numbness, change in vision  Past Medical History:  Diagnosis Date   Abscess of right iliac fossa 09/24/2018   required Perc Drain 09/24/2018   Arachnoid Cyst of brain bilaterally    "2 really small ones in the back of my head; inside; saw them w/MRI" (09/25/2012)   Bacterial pneumonia ~ 2012   "caught it here in the hospital" (09/25/2012)   Chronic kidney disease    "from my sickle cell" (09/25/2012)   CKD (chronic kidney disease), stage II    Colitis 04/19/2017   CT scan abd/ pelvis   GERD (gastroesophageal reflux disease)    "after I eat alot of spicey foods"  (09/25/2012)   Gynecomastia, male 07/10/2012   History of blood transfusion    "always related to sickle cell crisis" (09/25/2012)   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   Noted in noted from Hematology notes at [facility]   Migraines    "take RX qd to prevent them" (09/25/2012)   Opioid dependence with withdrawal (HCC) 08/30/2012   Renal insufficiency    Sickle cell anemia (HCC)    Sickle cell crisis (HCC) 09/25/2012   Sickle cell nephropathy (HCC) 07/10/2012   Sinus tachycardia    Tachycardia with heart rate 121-140 beats per minute with ambulation 08/04/2016    Past Surgical History:  Procedure Laterality Date   ABCESS DRAINAGE     CHOLECYSTECTOMY  ~ 2012   COLONOSCOPY N/A 04/23/2017   Procedure: COLONOSCOPY;  Surgeon: Hilarie Fredrickson, MD;  Location: [facility];  Service: Endoscopy;  Laterality: N/A;   IR FLUORO GUIDE CV LINE RIGHT  12/17/2016   IR FLUORO GUIDE CV LINE RIGHT  02/07/2021   IR RADIOLOGIST EVAL & MGMT  10/02/2018   IR RADIOLOGIST EVAL & MGMT  10/15/2018   IR REMOVAL TUN CV CATH W/O FL  12/21/2016   IR REMOVAL TUN CV CATH W/O FL  02/11/2021   IR US GUIDE VASC ACCESS RIGHT  12/17/2016   IR US GUIDE VASC ACCESS RIGHT  02/07/2021   spleenectomy      Allergies  Allergen Reactions   Nsaids Other (See Comments)    Kidney disease   Morphine And Codeine Other (See Comments)    "Really bad headaches"     Family History  Problem  Relation Age of Onset   Breast cancer Mother       Prior to Admission medications   Medication Sig Start Date End Date Taking? Authorizing Provider  amitriptyline (ELAVIL) 25 MG tablet Take 2 tablets (50 mg total) by mouth at bedtime. Patient taking differently: Take 75 mg by mouth at bedtime. 01/25/23   Ivonne Andrew, NP  calcitRIOL (ROCALTROL) 0.25 MCG capsule Take 0.25 mcg by mouth daily with breakfast. 11/16/20   [provider]  Deferiprone, Twice Daily, (FERRIPROX TWICE-A-DAY) 1000 MG TABS Take 3,000 mg by mouth  daily. 05/01/22   Massie Maroon, FNP  folic acid (FOLVITE) 1 MG tablet TAKE 1 TABLET BY MOUTH EVERY DAY 03/22/20   Massie Maroon, FNP  hydroxyurea (HYDREA) 500 MG capsule Take 1 capsule by mouth in the morning on Sun/Tues/Wed/Thurs/Sat and 2 capsules on Mon/Fri 01/25/23   Ivonne Andrew, NP  oxyCODONE (OXYCONTIN) 15 mg 12 hr tablet Take 1 tablet (15 mg) by mouth every 12 hours. 04/26/23   Ivonne Andrew, NP  Oxycodone HCl 10 MG TABS Take 1 tablet (10 mg total) by mouth See admin instructions. Take 10 mg by mouth every 6 hours Patient taking differently: Take 10 mg by mouth See admin instructions. Take 10 mg by mouth every 4-6 hours 05/08/23   Donell Beers, FNP  sodium bicarbonate 650 MG tablet Take 1,950 mg by mouth with breakfast, with lunch, and with evening meal. 11/07/18   [provider]  sodium zirconium cyclosilicate (LOKELMA) 10 g PACK packet Take 20 g by mouth daily. 04/21/23 04/22/24  [provider]  TYLENOL 8 HOUR ARTHRITIS PAIN 650 MG CR tablet Take 650 mg by mouth every 8 (eight) hours as needed for pain.    [provider]     Physical Exam: There were no vitals filed for this visit.  General: Alert, awake, afebrile, anicteric, not in obvious distress HEENT: Normocephalic and Atraumatic, Mucous membranes pink                PERRLA; EOM intact; No scleral icterus,                 Nares: Patent, Oropharynx: Clear, Fair Dentition                 Neck: FROM, no cervical lymphadenopathy, thyromegaly, carotid bruit or JVD;  CHEST WALL: No tenderness  CHEST: Normal respiration, clear to auscultation bilaterally  HEART: Regular rate and rhythm; no murmurs rubs or gallops  BACK: No kyphosis or scoliosis; no CVA tenderness  ABDOMEN: Positive Bowel Sounds, soft, non-tender; no masses, no organomegaly EXTREMITIES: No cyanosis, clubbing, or edema SKIN:  no rash or ulceration  CNS: Alert and Oriented x 4, Nonfocal exam, CN 2-12 intact  Labs on  Admission:  Basic Metabolic Panel: Recent Labs  Lab 05/16/23 1031  NA 139  K 4.2  CL 103  CO2 21  GLUCOSE 79  BUN 35*  CREATININE 2.58*  CALCIUM 8.5*   Liver Function Tests: Recent Labs  Lab 05/16/23 1031  AST 57*  ALT 43  ALKPHOS 316*  BILITOT 0.5  PROT 6.3  ALBUMIN 3.3*   No results for input(s): "LIPASE", "AMYLASE" in the last 168 hours. No results for input(s): "AMMONIA" in the last 168 hours. CBC: Recent Labs  Lab 05/16/23 1031  WBC 8.0  NEUTROABS 3.1  HGB 7.6*  HCT 22.5*  MCV 116*  PLT 300   Cardiac Enzymes: No results for input(s): "CKTOTAL", "CKMB", "  CKMBINDEX", "TROPONINI" in the last 168 hours.  BNP (last 3 results) No results for input(s): "BNP" in the last 8760 hours.  ProBNP (last 3 results) No results for input(s): "PROBNP" in the last 8760 hours.  CBG: No results for input(s): "GLUCAP" in the last 168 hours.   Assessment/Plan Principal Problem:   Sickle cell anemia with pain (HCC)  Admits to the Day Hospital for extended observation IVF 0.45% Saline @ 100 mls/hour Weight based Dilaudid PCA started within 30 minutes of admission Acetaminophen 1000 mg x 1 dose Labs: CBCD, CMP, Retic Count and LDH Monitor vitals very closely, Re-evaluate pain scale every hour 2 L of Oxygen by [place] Patient will be re-evaluated for pain in the context of function and relationship to baseline as care progresses. If no significant relieve from pain (remains above 5/10) will transfer patient to inpatient services for further evaluation and management  Code Status: Full  Family Communication: None  DVT Prophylaxis: Ambulate as tolerated   Time spent: 35 Minutes  Daryll Drown NP  If 7PM-7AM, please contact night-coverage [email or web address] 05/21/2023, 10:39 AM

## 2023-05-22 ENCOUNTER — Other Ambulatory Visit: Payer: Self-pay | Admitting: Nurse Practitioner

## 2023-05-22 DIAGNOSIS — D571 Sickle-cell disease without crisis: Secondary | ICD-10-CM

## 2023-05-22 NOTE — Telephone Encounter (Signed)
 Copied from CRM [id number]. Topic: Clinical - Medication Refill >> May 22, 2023  9:50 AM Patsy Lager T wrote: Most Recent Primary Care Visit:  Provider: [facility]  Department: [facility]  Visit Type: LAB  Date: 05/16/2023  Medication: oxyCODONE (OXYCONTIN) 15 mg 12 hr tablet  Has the patient contacted their pharmacy? No   Is this the correct pharmacy for this prescription? Yes If no, delete pharmacy and type the correct one.  This is the patient's preferred pharmacy:  [facility] [address] [place] - [address] [place] [address] Phone: [phone] Fax: [phone]   Has the prescription been filled recently? Yes  Is the patient out of the medication? Yes  Has the patient been seen for an appointment in the last year OR does the patient have an upcoming appointment? Yes  Can we respond through MyChart? No  Agent: Please be advised that Rx refills may take up to 3 business days. We ask that you follow-up with your pharmacy.

## 2023-05-22 NOTE — Telephone Encounter (Signed)
 Last Fill: 04/26/23 60 tabs/0 RF  Last OV: 04/26/23 Next OV: 08/20/23  Routing to provider for review/authorization.

## 2023-05-22 NOTE — Telephone Encounter (Signed)
 Please advise [facility]

## 2023-05-23 ENCOUNTER — Other Ambulatory Visit: Payer: Self-pay | Admitting: Nurse Practitioner

## 2023-05-23 DIAGNOSIS — D571 Sickle-cell disease without crisis: Secondary | ICD-10-CM

## 2023-05-23 DIAGNOSIS — G4700 Insomnia, unspecified: Secondary | ICD-10-CM

## 2023-05-23 MED ORDER — OXYCODONE HCL ER 15 MG PO T12A: 15.0000 mg | EXTENDED_RELEASE_TABLET | Freq: Two times a day (BID) | ORAL | 0 refills | Status: DC

## 2023-05-25 NOTE — Telephone Encounter (Signed)
 Completed.

## 2023-05-30 ENCOUNTER — Other Ambulatory Visit: Payer: Self-pay

## 2023-06-03 ENCOUNTER — Other Ambulatory Visit: Payer: Self-pay

## 2023-06-03 ENCOUNTER — Encounter (HOSPITAL_COMMUNITY): Payer: Self-pay

## 2023-06-03 ENCOUNTER — Emergency Department (HOSPITAL_COMMUNITY)

## 2023-06-03 ENCOUNTER — Inpatient Hospital Stay (HOSPITAL_COMMUNITY)
Admission: EM | Admit: 2023-06-03 | Discharge: 2023-06-05 | DRG: 812 | Disposition: A | Discharge status: Home / Self Care | Attending: Internal Medicine | Admitting: Internal Medicine

## 2023-06-03 DIAGNOSIS — D5700 Hb-SS disease with crisis, unspecified: Secondary | ICD-10-CM | POA: Diagnosis present

## 2023-06-03 DIAGNOSIS — Z803 Family history of malignant neoplasm of breast: Secondary | ICD-10-CM

## 2023-06-03 DIAGNOSIS — Z885 Allergy status to narcotic agent status: Secondary | ICD-10-CM | POA: Diagnosis not present

## 2023-06-03 DIAGNOSIS — F17200 Nicotine dependence, unspecified, uncomplicated: Secondary | ICD-10-CM | POA: Diagnosis present

## 2023-06-03 DIAGNOSIS — Z8701 Personal history of pneumonia (recurrent): Secondary | ICD-10-CM

## 2023-06-03 DIAGNOSIS — N184 Chronic kidney disease, stage 4 (severe): Secondary | ICD-10-CM | POA: Diagnosis present

## 2023-06-03 DIAGNOSIS — G894 Chronic pain syndrome: Secondary | ICD-10-CM | POA: Diagnosis present

## 2023-06-03 DIAGNOSIS — Z79899 Other long term (current) drug therapy: Secondary | ICD-10-CM

## 2023-06-03 DIAGNOSIS — N189 Chronic kidney disease, unspecified: Secondary | ICD-10-CM

## 2023-06-03 DIAGNOSIS — Z9049 Acquired absence of other specified parts of digestive tract: Secondary | ICD-10-CM | POA: Diagnosis not present

## 2023-06-03 DIAGNOSIS — Z886 Allergy status to analgesic agent status: Secondary | ICD-10-CM

## 2023-06-03 DIAGNOSIS — D72829 Elevated white blood cell count, unspecified: Secondary | ICD-10-CM | POA: Diagnosis present

## 2023-06-03 DIAGNOSIS — D631 Anemia in chronic kidney disease: Secondary | ICD-10-CM | POA: Diagnosis present

## 2023-06-03 DIAGNOSIS — F17210 Nicotine dependence, cigarettes, uncomplicated: Secondary | ICD-10-CM | POA: Diagnosis present

## 2023-06-03 DIAGNOSIS — D649 Anemia, unspecified: Secondary | ICD-10-CM

## 2023-06-03 DIAGNOSIS — Z9081 Acquired absence of spleen: Secondary | ICD-10-CM

## 2023-06-03 LAB — COMPREHENSIVE METABOLIC PANEL WITH GFR
ALT: 39 U/L (ref 0–44)
AST: 54 U/L — ABNORMAL HIGH (ref 15–41)
Albumin: 2.8 g/dL — ABNORMAL LOW (ref 3.5–5.0)
Alkaline Phosphatase: 219 U/L — ABNORMAL HIGH (ref 38–126)
Anion gap: 9 (ref 5–15)
BUN: 46 mg/dL — ABNORMAL HIGH (ref 6–20)
CO2: 26 mmol/L (ref 22–32)
Calcium: 8.5 mg/dL — ABNORMAL LOW (ref 8.9–10.3)
Chloride: 101 mmol/L (ref 98–111)
Creatinine, Ser: 3.13 mg/dL — ABNORMAL HIGH (ref 0.61–1.24)
GFR, Estimated: 25 mL/min — ABNORMAL LOW (ref >60)
Glucose, Bld: 71 mg/dL (ref 70–99)
Potassium: 3.6 mmol/L (ref 3.5–5.1)
Sodium: 136 mmol/L (ref 135–145)
Total Bilirubin: 0.9 mg/dL (ref 0.0–1.2)
Total Protein: 7.1 g/dL (ref 6.5–8.1)

## 2023-06-03 LAB — RETICULOCYTES
Immature Retic Fract: 34.7 % — ABNORMAL HIGH (ref 2.3–15.9)
RBC.: 1.71 MIL/uL — ABNORMAL LOW (ref 4.22–5.81)
Retic Count, Absolute: 74.9 10*3/uL (ref 19.0–186.0)
Retic Ct Pct: 4.4 % — ABNORMAL HIGH (ref 0.4–3.1)

## 2023-06-03 LAB — CBC WITH DIFFERENTIAL/PLATELET
Abs Immature Granulocytes: 0.08 10*3/uL — ABNORMAL HIGH (ref 0.00–0.07)
Basophils Absolute: 0 10*3/uL (ref 0.0–0.1)
Basophils Relative: 0 %
Eosinophils Absolute: 0.1 10*3/uL (ref 0.0–0.5)
Eosinophils Relative: 1 %
HCT: 20 % — ABNORMAL LOW (ref 39.0–52.0)
Hemoglobin: 6.9 g/dL — CL (ref 13.0–17.0)
Immature Granulocytes: 1 %
Lymphocytes Relative: 40 %
Lymphs Abs: 5.2 10*3/uL — ABNORMAL HIGH (ref 0.7–4.0)
MCH: 40.4 pg — ABNORMAL HIGH (ref 26.0–34.0)
MCHC: 34.5 g/dL (ref 30.0–36.0)
MCV: 117 fL — ABNORMAL HIGH (ref 80.0–100.0)
Monocytes Absolute: 0.4 10*3/uL (ref 0.1–1.0)
Monocytes Relative: 3 %
Neutro Abs: 7.1 10*3/uL (ref 1.7–7.7)
Neutrophils Relative %: 55 %
Platelets: 169 10*3/uL (ref 150–400)
RBC: 1.71 MIL/uL — ABNORMAL LOW (ref 4.22–5.81)
RDW: 25.3 % — ABNORMAL HIGH (ref 11.5–15.5)
WBC: 12.9 10*3/uL — ABNORMAL HIGH (ref 4.0–10.5)
nRBC: 0.3 % — ABNORMAL HIGH (ref 0.0–0.2)

## 2023-06-03 LAB — TROPONIN I (HIGH SENSITIVITY)
Troponin I (High Sensitivity): 10 ng/L (ref <18)
Troponin I (High Sensitivity): 11 ng/L (ref <18)

## 2023-06-03 MED ORDER — HYDROMORPHONE HCL 1 MG/ML IJ SOLN
2.0000 mg | INTRAMUSCULAR | Status: AC
  Administered 2023-06-03: 2 mg via INTRAVENOUS
  Filled 2023-06-03: qty 2

## 2023-06-03 MED ORDER — SODIUM CHLORIDE 0.45 % IV SOLN: INTRAVENOUS | Status: DC

## 2023-06-03 MED ORDER — ACETAMINOPHEN 325 MG PO TABS
650.0000 mg | ORAL_TABLET | Freq: Four times a day (QID) | ORAL | Status: DC | PRN
  Filled 2023-06-03: qty 2

## 2023-06-03 MED ORDER — HYDROMORPHONE HCL 1 MG/ML IJ SOLN: 2.0000 mg | INTRAMUSCULAR | Status: DC

## 2023-06-03 MED ORDER — ONDANSETRON HCL 4 MG/2ML IJ SOLN
4.0000 mg | Freq: Once | INTRAMUSCULAR | Status: AC
  Administered 2023-06-03: 4 mg via INTRAVENOUS
  Filled 2023-06-03: qty 2

## 2023-06-03 MED ORDER — DIPHENHYDRAMINE HCL 50 MG/ML IJ SOLN
12.5000 mg | Freq: Once | INTRAMUSCULAR | Status: AC
  Administered 2023-06-03: 12.5 mg via INTRAVENOUS
  Filled 2023-06-03: qty 1

## 2023-06-03 MED ORDER — HYDROMORPHONE 1 MG/ML IV SOLN
INTRAVENOUS | Status: DC
  Administered 2023-06-03: 2.5 mg via INTRAVENOUS
  Administered 2023-06-03: 4.5 mg via INTRAVENOUS
  Administered 2023-06-03: 4.4 mg via INTRAVENOUS
  Administered 2023-06-03: 30 mg via INTRAVENOUS
  Administered 2023-06-04: 3 mg via INTRAVENOUS
  Administered 2023-06-04: 3.5 mg via INTRAVENOUS
  Administered 2023-06-04: 3 mg via INTRAVENOUS
  Administered 2023-06-04: 30 mg via INTRAVENOUS
  Administered 2023-06-04: 3 mg via INTRAVENOUS
  Administered 2023-06-05: 4 mg via INTRAVENOUS
  Filled 2023-06-03 (×2): qty 30

## 2023-06-03 MED ORDER — HYDROXYUREA 500 MG PO CAPS: 500.0000 mg | ORAL_CAPSULE | ORAL | Status: DC

## 2023-06-03 MED ORDER — DIPHENHYDRAMINE HCL 25 MG PO CAPS
25.0000 mg | ORAL_CAPSULE | ORAL | Status: DC | PRN
  Filled 2023-06-03: qty 1

## 2023-06-03 MED ORDER — SODIUM CHLORIDE 0.45 % IV SOLN: INTRAVENOUS | Status: AC

## 2023-06-03 MED ORDER — FOLIC ACID 1 MG PO TABS
1.0000 mg | ORAL_TABLET | Freq: Every day | ORAL | Status: DC
  Administered 2023-06-04 – 2023-06-05 (×2): 1 mg via ORAL
  Filled 2023-06-03 (×2): qty 1

## 2023-06-03 MED ORDER — HYDROMORPHONE HCL 1 MG/ML IJ SOLN
2.0000 mg | INTRAMUSCULAR | Status: DC
  Filled 2023-06-03: qty 2

## 2023-06-03 MED ORDER — ACETAMINOPHEN ER 650 MG PO TBCR: 650.0000 mg | EXTENDED_RELEASE_TABLET | Freq: Three times a day (TID) | ORAL | Status: DC | PRN

## 2023-06-03 MED ORDER — SODIUM CHLORIDE 0.9% FLUSH: 9.0000 mL | INTRAVENOUS | Status: DC | PRN

## 2023-06-03 MED ORDER — OXYCODONE HCL ER 15 MG PO T12A
15.0000 mg | EXTENDED_RELEASE_TABLET | Freq: Two times a day (BID) | ORAL | Status: DC
  Administered 2023-06-03 – 2023-06-05 (×4): 15 mg via ORAL
  Filled 2023-06-03 (×4): qty 1

## 2023-06-03 MED ORDER — HYDROMORPHONE HCL 1 MG/ML IJ SOLN
1.0000 mg | Freq: Once | INTRAMUSCULAR | Status: DC
  Filled 2023-06-03: qty 1

## 2023-06-03 MED ORDER — SENNOSIDES-DOCUSATE SODIUM 8.6-50 MG PO TABS
1.0000 | ORAL_TABLET | Freq: Two times a day (BID) | ORAL | Status: DC
  Filled 2023-06-03 (×3): qty 1

## 2023-06-03 MED ORDER — HYDROMORPHONE HCL 1 MG/ML IJ SOLN
2.0000 mg | INTRAMUSCULAR | Status: AC
  Administered 2023-06-03: 2 mg via INTRAVENOUS

## 2023-06-03 MED ORDER — NALOXONE HCL 0.4 MG/ML IJ SOLN: 0.4000 mg | INTRAMUSCULAR | Status: DC | PRN

## 2023-06-03 MED ORDER — POLYETHYLENE GLYCOL 3350 17 G PO PACK: 17.0000 g | PACK | Freq: Every day | ORAL | Status: DC | PRN

## 2023-06-03 MED ORDER — ONDANSETRON HCL 4 MG/2ML IJ SOLN: 4.0000 mg | Freq: Four times a day (QID) | INTRAMUSCULAR | Status: DC | PRN

## 2023-06-03 NOTE — ED Triage Notes (Signed)
 Pt reports falling and hitting head on Friday. (-) LOC, (-) thinners.

## 2023-06-03 NOTE — Plan of Care (Signed)

## 2023-06-03 NOTE — Progress Notes (Cosign Needed Addendum)
 Patient ID: [name] [name]., male   DOB: March 01, 1980, 43 y.o.   MRN: [id number] H&P  Patient Demographics:  [name] [name], is a 43 y.o. male  MRN: [id number]   DOB - 1980/10/03  Admit Date - 06/03/2023  Outpatient Primary MD for the patient is [name], NP  Chief Complaint  Patient presents with   Sickle Cell Pain Crisis   Fall      HPI:   [name] [name]  is a 43 y.o. male with history of sickle cell disease, chronic pain syndrome, opiate dependence and tolerance, anemia of chronic disease, and CKD stage IV who presented to the day hospital today with major complaints of generalized body pain but primarily in his head and lower extremities. Patient claims he has had 1 fall in the last week, he did not sustain any cuts and bruises during these fall, however he has a little swelling on his right lateral forehead about a penny size.  no inciting factors. Now he is in severe pain, rated at 10/10, generalized but mostly bilateral lower extremity. He describes this pain as constant and achy and throbbing. He took his home medications with no sustained relief. He denies any fever, headache, cough, blurry vision, chest pain, shortness of breath, nausea, vomiting or diarrhea. No urinary symptoms. He denies any recent travels, sick contacts    ED Course: BP 115/80  Pulse (!) 101  Temp 98 F (36.7 C) (Oral)  Resp 14  Ht 1.6 m (5\' 3" )  Wt 54.4 kg  SpO2 100%  BMI 21.26 kg/m HGB 6.9 g/dl WBC 57.8 Pain uncontrolled with dilaudid  and hydration. Patient is admitted for ongoing sickle cell pain management with anemia.     Review of systems:  In addition to the HPI above, patient reports No fever or chills No Headache, No changes with vision or hearing No problems swallowing food or liquids No chest pain, cough or shortness of breath No abdominal pain, No nausea or vomiting, Bowel movements are regular No blood in stool or urine No dysuria No new skin rashes or bruises No new  joints pains-aches No new weakness, tingling, numbness in any extremity No recent weight gain or loss No polyuria, polydypsia or polyphagia No significant Mental Stressors  A full 10 point Review of Systems was done, except as stated above, all other Review of Systems were negative.  With Past History of the following :   Past Medical History:  Diagnosis Date   Abscess of right iliac fossa 09/24/2018   required Perc Drain 09/24/2018   Arachnoid Cyst of brain bilaterally    "2 really small ones in the back of my head; inside; saw them w/MRI" (09/25/2012)   Bacterial pneumonia ~ 2012   "caught it here in the hospital" (09/25/2012)   Chronic kidney disease    "from my sickle cell" (09/25/2012)   CKD (chronic kidney disease), stage II    Colitis 04/19/2017   CT scan abd/ pelvis   GERD (gastroesophageal reflux disease)    "after I eat alot of spicey foods" (09/25/2012)   Gynecomastia, male 07/10/2012   History of blood transfusion    "always related to sickle cell crisis" (09/25/2012)   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   Noted in noted from [facility]ematology notes at [facility]   Migraines    "take RX qd to prevent them" (09/25/2012)   Opioid dependence with withdrawal (HCC) 08/30/2012   Renal insufficiency    Sickle cell anemia (HCC)    Sickle  cell crisis (HCC) 09/25/2012   Sickle cell nephropathy (HCC) 07/10/2012   Sinus tachycardia    Tachycardia with heart rate 121-140 beats per minute with ambulation 08/04/2016      Past Surgical History:  Procedure Laterality Date   ABCESS DRAINAGE     CHOLECYSTECTOMY  ~ 2012   COLONOSCOPY N/A 04/23/2017   Procedure: COLONOSCOPY;  Surgeon: Tobin Forts, MD;  Location: [facility];  Service: Endoscopy;  Laterality: N/A;   IR FLUORO GUIDE CV LINE RIGHT  12/17/2016   IR FLUORO GUIDE CV LINE RIGHT  02/07/2021   IR RADIOLOGIST EVAL & MGMT  10/02/2018   IR RADIOLOGIST EVAL & MGMT  10/15/2018   IR REMOVAL TUN CV CATH W/O FL  12/21/2016   IR  REMOVAL TUN CV CATH W/O FL  02/11/2021   IR US  GUIDE VASC ACCESS RIGHT  12/17/2016   IR US  GUIDE VASC ACCESS RIGHT  02/07/2021   spleenectomy       Social History:   Social History   Tobacco Use   Smoking status: Former    Current packs/day: 0.10    Types: Cigarettes   Smokeless tobacco: Never  Substance Use Topics   Alcohol  use: Yes    Comment: Once a month     Lives - At home   Family History :   Family History  Problem Relation Age of Onset   Breast cancer Mother      Home Medications:   Prior to Admission medications   Medication Sig Start Date End Date Taking? Authorizing Provider  amitriptyline  (ELAVIL ) 25 MG tablet TAKE 2 TABLETS(50 MG) BY MOUTH AT BEDTIME 05/24/23   Nichols, [name], NP  calcitRIOL  (ROCALTROL ) 0.25 MCG capsule Take 0.25 mcg by mouth daily with breakfast. 11/16/20   [provider]  Deferiprone , Twice Daily, (FERRIPROX  TWICE-A-DAY) 1000 MG TABS Take 3,000 mg by mouth daily. 05/01/22   Sigurd Driver, FNP  folic acid  (FOLVITE ) 1 MG tablet TAKE 1 TABLET BY MOUTH EVERY DAY 03/22/20   Hollis, Lachina M, FNP  hydroxyurea  (HYDREA ) 500 MG capsule Take 1 capsule by mouth in the morning on Sun/Tues/Wed/Thurs/Sat and 2 capsules on Mon/Fri 01/25/23   Nichols, [name], NP  oxyCODONE  (OXYCONTIN ) 15 mg 12 hr tablet Take 1 tablet (15 mg) by mouth every 12 hours. 05/26/23   [name], NP  Oxycodone  HCl 10 MG TABS Take 1 tablet (10 mg total) by mouth See admin instructions. Take 10 mg by mouth every 6 hours 05/22/23   Nichols, [name], NP  sodium bicarbonate  650 MG tablet Take 1,950 mg by mouth with breakfast, with lunch, and with evening meal. 11/07/18   [provider]  sodium zirconium cyclosilicate  (LOKELMA ) 10 g PACK packet Take 20 g by mouth daily. 04/21/23 04/22/24  [provider]  TYLENOL  8 HOUR ARTHRITIS PAIN 650 MG CR tablet Take 650 mg by mouth every 8 (eight) hours as needed for pain.    [provider]     Allergies:    Allergies  Allergen Reactions   Nsaids Other (See Comments)    Kidney disease   Morphine  And Codeine  Other (See Comments)    "Really bad headaches"      Physical Exam:   Vitals:   Vitals:   06/03/23 0846 06/03/23 1258  BP:  118/80  Pulse:  97  Resp:  15  Temp: 98 F (36.7 C) 97.8 F (36.6 C)  SpO2:      Physical Exam: Constitutional: Patient  appears well-developed and well-nourished. Not in obvious distress. HENT: Normocephalic, atraumatic, External right and left ear normal. Oropharynx is clear and moist.  Eyes: Conjunctivae and EOM are normal. PERRLA, no scleral icterus. Neck: Normal ROM. Neck supple. No JVD. No tracheal deviation. No thyromegaly. CVS: RRR, S1/S2 +, no murmurs, no gallops, no carotid bruit.  Pulmonary: Effort and breath sounds normal, no stridor, rhonchi, wheezes, rales.  Abdominal: Soft. BS +, no distension, tenderness, rebound or guarding.  Musculoskeletal: Generalize body pain, bilateral lower extremity pain.  Lymphadenopathy: No lymphadenopathy noted, cervical, inguinal or axillary Neuro: Alert. Normal reflexes, muscle tone coordination. No cranial nerve deficit. Skin: Skin is warm and dry. No rash noted. Not diaphoretic. No erythema. No pallor. Psychiatric: Normal mood and affect. Behavior, judgment, thought content normal.   Data Review:   CBC Recent Labs  Lab 06/03/23 0738  WBC 12.9*  HGB 6.9*  HCT 20.0*  PLT 169  MCV 117.0*  MCH 40.4*  MCHC 34.5  RDW 25.3*  LYMPHSABS 5.2*  MONOABS 0.4  EOSABS 0.1  BASOSABS 0.0   ------------------------------------------------------------------------------------------------------------------  Chemistries  Recent Labs  Lab 06/03/23 0738  NA 136  K 3.6  CL 101  CO2 26  GLUCOSE 71  BUN 46*  CREATININE 3.13*  CALCIUM  8.5*  AST 54*  ALT 39  ALKPHOS 219*  BILITOT 0.9    ------------------------------------------------------------------------------------------------------------------ estimated creatinine clearance is 24.5 mL/min (A) (by C-G formula based on SCr of 3.13 mg/dL (H)). ------------------------------------------------------------------------------------------------------------------ No results for input(s): "TSH", "T4TOTAL", "T3FREE", "THYROIDAB" in the last 72 hours.  Invalid input(s): "FREET3"  Coagulation profile No results for input(s): "INR", "PROTIME" in the last 168 hours. ------------------------------------------------------------------------------------------------------------------- No results for input(s): "DDIMER" in the last 72 hours. -------------------------------------------------------------------------------------------------------------------  Cardiac Enzymes No results for input(s): "CKMB", "TROPONINI", "MYOGLOBIN" in the last 168 hours.  Invalid input(s): "CK" ------------------------------------------------------------------------------------------------------------------ No results found for: "BNP"  ---------------------------------------------------------------------------------------------------------------  Urinalysis    Component Value Date/Time   COLORURINE YELLOW 04/03/2022 1011   APPEARANCEUR CLEAR 04/03/2022 1011   LABSPEC 1.008 04/03/2022 1011   PHURINE 8.0 04/03/2022 1011   GLUCOSEU 50 (A) 04/03/2022 1011   HGBUR NEGATIVE 04/03/2022 1011   HGBUR small 03/10/2010 1445   BILIRUBINUR NEGATIVE 04/03/2022 1011   BILIRUBINUR negative 04/19/2021 1217   BILIRUBINUR neg 09/30/2020 1117   KETONESUR NEGATIVE 04/03/2022 1011   PROTEINUR NEGATIVE 04/03/2022 1011   UROBILINOGEN 0.2 04/19/2021 1217   UROBILINOGEN 0.2 05/11/2017 1335   NITRITE NEGATIVE 04/03/2022 1011   LEUKOCYTESUR NEGATIVE 04/03/2022 1011     ----------------------------------------------------------------------------------------------------------------   Imaging Results:    DG Chest 2 View Result Date: 06/03/2023 CLINICAL DATA:  Shortness of breath. History of sickle cell disease. EXAM: CHEST - 2 VIEW COMPARISON:  01/29/2023 FINDINGS: The heart size appears normal. There is no sign of pleural effusion. Similar appearance of chronic, fine, diffuse reticular interstitial opacities throughout both lungs. Diffuse bronchial wall thickening identified. Mild bilateral bronchiectasis. Scarring noted within the left base. No superimposed airspace consolidation. Advanced changes of avascular necrosis with subchondral collapse involving the right humeral head. Surgical clips noted in the right upper quadrant of the abdomen. Calcified spleen. IMPRESSION: 1. No acute cardiopulmonary abnormalities. 2. Chronic reticular interstitial opacities throughout both lungs with mild bilateral bronchiectasis. 3. Diffuse bronchial wall thickening compatible with bronchitis. 4. Advanced changes of avascular necrosis with subchondral collapse involving the right humeral head. Electronically Signed   By: Kimberley Penman M.D.   On: 06/03/2023 07:06     Assessment & Plan:  Principal Problem:  Sickle cell anemia with pain (HCC) Active Problems:   TOBACCO ABUSE   CKD (chronic kidney disease), stage IV (HCC)   Leukocytosis   Hb Sickle Cell Disease with crisis: Admit patient, start IVF 0.45% Saline @ 100 mls/hour, start weight based Dilaudid  PCA, Restart oral home pain medications, Monitor vitals very closely, Re-evaluate pain scale regularly, 2 L of Oxygen  by [place], Patient will be re-evaluated for pain in the context of function and relationship to baseline as care progresses. Leukocytosis: Elevated no s/s of acute infection. Will continue to monitor, daily cbc in place.  Anemia of Chronic Disease: HGB is below base line @ 6.9 g/dl will repeat in the AM and  transfuse if needed.  Chronic pain Syndrome: Continue oral home medication  Tobacco use disorder:Fitzhugh was counseled on the dangers of tobacco use, and was advised to quit. Reviewed strategies to maximize success, including removing cigarettes and smoking materials from environment, stress management and support of family/friends.  Chronic kidney disease stage IV: Continue to monitor his renal function.  Creatinine is 2.85mg /dl     DVT Prophylaxis: Subcut Lovenox    AM Labs Ordered, also please review Full Orders  Family Communication: Admission, patient's condition and plan of care including tests being ordered have been discussed with the patient who indicate understanding and agree with the plan and Code Status.  Code Status: Full Code  Consults called: None    Admission status: Inpatient    Time spent in minutes : 50 minutes  Lorel Roes NP 06/03/2023 at 1:08 PM

## 2023-06-03 NOTE — ED Provider Notes (Signed)
 [place] [facility]GENCY DEPARTMENT AT [facility] Provider Note   CSN: [id number] Arrival date & time: 06/03/23  3244     History  Chief Complaint  Patient presents with   Sickle Cell Pain Crisis   Fall    [name] [name]. is a 43 y.o. male.  HPI 43 year old male with known sickle cell disease, pain crises, and CKD.  Patient seen and evaluated for sickle cell pain.  Patient states he has pain that is consistent with his usual sickle cell pain in his arms and legs.  He describes this as deep type bone pain.  He has not had any fever, chills, nausea, vomiting, diarrhea.  He is taking his home medications as prescribed.  Nursing triage note states associated shortness of breath and fatigue.  Patient does have some generalized weakness.  He is tachycardic to 130.  He is not in any terms.    Home Medications Prior to Admission medications   Medication Sig Start Date End Date Taking? Authorizing Provider  amitriptyline  (ELAVIL ) 25 MG tablet TAKE 2 TABLETS(50 MG) BY MOUTH AT BEDTIME 05/24/23   Nichols, [name], NP  calcitRIOL  (ROCALTROL ) 0.25 MCG capsule Take 0.25 mcg by mouth daily with breakfast. 11/16/20   [provider]  Deferiprone , Twice Daily, (FERRIPROX  TWICE-A-DAY) 1000 MG TABS Take 3,000 mg by mouth daily. 05/01/22   Sigurd Driver, FNP  folic acid  (FOLVITE ) 1 MG tablet TAKE 1 TABLET BY MOUTH EVERY DAY 03/22/20   Hollis, Lachina M, FNP  hydroxyurea  (HYDREA ) 500 MG capsule Take 1 capsule by mouth in the morning on Sun/Tues/Wed/Thurs/Sat and 2 capsules on Mon/Fri 01/25/23   Nichols, [name], NP  oxyCODONE  (OXYCONTIN ) 15 mg 12 hr tablet Take 1 tablet (15 mg) by mouth every 12 hours. 05/26/23   [name], NP  Oxycodone  HCl 10 MG TABS Take 1 tablet (10 mg total) by mouth See admin instructions. Take 10 mg by mouth every 6 hours 05/22/23   [name], NP  sodium bicarbonate  650 MG tablet Take 1,950 mg by mouth with breakfast, with lunch, and with evening  meal. 11/07/18   [provider]  sodium zirconium cyclosilicate  (LOKELMA ) 10 g PACK packet Take 20 g by mouth daily. 04/21/23 04/22/24  [provider]  TYLENOL  8 HOUR ARTHRITIS PAIN 650 MG CR tablet Take 650 mg by mouth every 8 (eight) hours as needed for pain.    [provider]      Allergies    Nsaids and Morphine  and codeine     Review of Systems   Review of Systems  Physical Exam Updated Vital Signs BP 115/80   Pulse (!) 101   Temp 98 F (36.7 C) (Oral)   Resp 14   Ht 1.6 m (5\' 3" )   Wt 54.4 kg   SpO2 100%   BMI 21.26 kg/m  Physical Exam Vitals reviewed.  HENT:     Head: Normocephalic.     Right Ear: External ear normal.     Left Ear: External ear normal.     Nose: Nose normal.     Mouth/Throat:     Pharynx: Oropharynx is clear.  Eyes:     Extraocular Movements: Extraocular movements intact.     Pupils: Pupils are equal, round, and reactive to light.  Cardiovascular:     Rate and Rhythm: Tachycardia present.  Pulmonary:     Effort: Pulmonary effort is normal.     Breath sounds: Normal breath sounds.  Abdominal:  General: Abdomen is flat.     Palpations: Abdomen is soft.  Musculoskeletal:        General: No swelling. Normal range of motion.     Cervical back: Normal range of motion.     Right lower leg: No edema.     Left lower leg: No edema.  Skin:    General: Skin is warm and dry.     Capillary Refill: Capillary refill takes less than 2 seconds.  Neurological:     General: No focal deficit present.     Mental Status: He is alert.  Psychiatric:        Mood and Affect: Mood normal.     ED Results / Procedures / Treatments   Labs (all labs ordered are listed, but only abnormal results are displayed) Labs Reviewed  COMPREHENSIVE METABOLIC PANEL WITH GFR - Abnormal; Notable for the following components:      Result Value   BUN 46 (*)    Creatinine, Ser 3.13 (*)    Calcium  8.5 (*)    Albumin  2.8 (*)    AST 54 (*)     Alkaline Phosphatase 219 (*)    GFR, Estimated 25 (*)    All other components within normal limits  CBC WITH DIFFERENTIAL/PLATELET - Abnormal; Notable for the following components:   WBC 12.9 (*)    RBC 1.71 (*)    Hemoglobin 6.9 (*)    HCT 20.0 (*)    MCV 117.0 (*)    MCH 40.4 (*)    RDW 25.3 (*)    nRBC 0.3 (*)    Lymphs Abs 5.2 (*)    Abs Immature Granulocytes 0.08 (*)    All other components within normal limits  RETICULOCYTES - Abnormal; Notable for the following components:   Retic Ct Pct 4.4 (*)    RBC. 1.71 (*)    Immature Retic Fract 34.7 (*)    All other components within normal limits  URINALYSIS, ROUTINE W REFLEX MICROSCOPIC  RAPID URINE DRUG SCREEN, HOSP PERFORMED  TYPE AND SCREEN  TROPONIN I (HIGH SENSITIVITY)  TROPONIN I (HIGH SENSITIVITY)    EKG EKG Interpretation Date/Time:  Sunday June 03 2023 06:31:31 EDT Ventricular Rate:  124 PR Interval:  165 QRS Duration:  70 QT Interval:  308 QTC Calculation: 443 R Axis:   41  Text Interpretation: Sinus tachycardia SINCE LAST TRACING HEART RATE HAS INCREASED Confirmed by Auston Blush [phone]) on 06/03/2023 8:15:56 AM  Radiology DG Chest 2 View Result Date: 06/03/2023 CLINICAL DATA:  Shortness of breath. History of sickle cell disease. EXAM: CHEST - 2 VIEW COMPARISON:  01/29/2023 FINDINGS: The heart size appears normal. There is no sign of pleural effusion. Similar appearance of chronic, fine, diffuse reticular interstitial opacities throughout both lungs. Diffuse bronchial wall thickening identified. Mild bilateral bronchiectasis. Scarring noted within the left base. No superimposed airspace consolidation. Advanced changes of avascular necrosis with subchondral collapse involving the right humeral head. Surgical clips noted in the right upper quadrant of the abdomen. Calcified spleen. IMPRESSION: 1. No acute cardiopulmonary abnormalities. 2. Chronic reticular interstitial opacities throughout both lungs with mild  bilateral bronchiectasis. 3. Diffuse bronchial wall thickening compatible with bronchitis. 4. Advanced changes of avascular necrosis with subchondral collapse involving the right humeral head. Electronically Signed   By: Kimberley Penman M.D.   On: 06/03/2023 07:06    Procedures Procedures    Medications Ordered in ED Medications  0.45 % sodium chloride  infusion ( Intravenous New Bag/Given 06/03/23 0736)  HYDROmorphone  (  DILAUDID ) injection 2 mg (2 mg Intravenous Given 06/03/23 0746)  HYDROmorphone  (DILAUDID ) injection 2 mg (2 mg Intravenous Given 06/03/23 0846)  HYDROmorphone  (DILAUDID ) injection 2 mg (2 mg Intravenous Given 06/03/23 0925)  ondansetron  (ZOFRAN ) injection 4 mg (4 mg Intravenous Given 06/03/23 0751)  diphenhydrAMINE  (BENADRYL ) injection 12.5 mg (12.5 mg Intravenous Given 06/03/23 1022)    ED Course/ Medical Decision Making/ A&P Clinical Course as of 06/03/23 1135  Sun Jun 03, 2023  0814 Hemoglobin(!!): 6.9 [DR]  0814 Chest x-Winfred Redel personally reviewed and interpreted no evidence of acute abnormality noted, radiologist interpretation notes chronic reticular interstitial opacities throughout both lungs with mild bilateral bronchiectasis with no acute cardiopulmonary abnormalities noted [DR]  1044 Complete metabolic panel reviewed interpreted and significant for upward trending creatinine now at 3.13 Reticulocyte site count percent 4.4% CBC reviewed interpreted seen for hemoglobin decreased to 6.9 [DR]  1045 First prior was was 8 it was in the sixes several weeks ago [DR]    Clinical Course User Index [DR] Auston Blush, MD                                 Medical Decision Making Amount and/or Complexity of Data Reviewed Labs: ordered. Decision-making details documented in ED Course. Radiology: ordered.  Risk Prescription drug management.   43 year old male with known sickle cell disease presents with symptoms consistent with prior pain crisis.  Patient evaluated here with  labs, imaging.  Patient received IV fluids and IV pain medications as well as Benadryl . Pain is somewhat improved but he continues to have some significant pain. Hemoglobin has decreased from first prior of 8-6.9.  Creatinine has increased to 3.13 from first prior of 2.98.  Patient is receiving IV fluids. Patient was significantly tachycardic at 130 on arrival. He has received IV fluids and may be hemodilated with lower hemoglobin at this time. He does need ongoing IV fluids and monitoring of his kidney function and hemoglobin. Plan to discuss with hospitalist for admission Discussed with Marylu Soda who will see for admission       Final Clinical Impression(s) / ED Diagnoses Final diagnoses:  Sickle cell pain crisis (HCC)  Anemia, unspecified type  Chronic kidney disease, unspecified CKD stage    Rx / DC Orders ED Discharge Orders     None         Auston Blush, MD 06/03/23 1135

## 2023-06-03 NOTE — Progress Notes (Signed)
 Hydroxyurea  (Hydrea ) HOLD criteria ? ANC < 2000 ? Pltc < 80K in sickle-cell patients ? Hgb <= 6 in sickle-cell patients;  ? Reticulocytes < 80K when Hgb < 9 (Retics 74, Hgb 6.9) ? NOTE: In patients receiving for sickle cell disease, no auto-hold for fever/infection unless ANC low as described above  Plan: Hold Hydroxyurea  for now.  Eriana Suliman S. Zannie Hey, PharmD, BCPS Clinical Staff Pharmacist

## 2023-06-03 NOTE — ED Triage Notes (Signed)
 Pt reports Sickle Cell pain crisis starting Friday night. Not improving with pain meds. Pain in bilateral arms and legs (typical presentation per patient). Associated SOB, fatigue. HR 130 in triage. No acute respiratory distress noted.

## 2023-06-03 NOTE — ED Notes (Signed)
 Pt said he is unable to urinate at this time. Fluids provided in gray pitcher

## 2023-06-03 NOTE — ED Notes (Signed)
 Pt asked to go to restroom. I instructed pt that we needed an urine specimen. Pt was given urinal as well as letting him know specimen cups were in bottom drawer in the restroom. I went back into the room to collect urine specimen pt state " oh, I didn't know you needed one."

## 2023-06-03 NOTE — ED Notes (Signed)
 Patient complains of itching note sent to provider and nurse

## 2023-06-04 LAB — HEMOGLOBIN AND HEMATOCRIT, BLOOD
HCT: 23.3 % — ABNORMAL LOW (ref 39.0–52.0)
Hemoglobin: 7.8 g/dL — ABNORMAL LOW (ref 13.0–17.0)

## 2023-06-04 LAB — CBC
HCT: 18.2 % — ABNORMAL LOW (ref 39.0–52.0)
Hemoglobin: 6.1 g/dL — CL (ref 13.0–17.0)
MCH: 40.1 pg — ABNORMAL HIGH (ref 26.0–34.0)
MCHC: 33.5 g/dL (ref 30.0–36.0)
MCV: 119.7 fL — ABNORMAL HIGH (ref 80.0–100.0)
Platelets: 154 10*3/uL (ref 150–400)
RBC: 1.52 MIL/uL — ABNORMAL LOW (ref 4.22–5.81)
RDW: 25.7 % — ABNORMAL HIGH (ref 11.5–15.5)
WBC: 10.7 10*3/uL — ABNORMAL HIGH (ref 4.0–10.5)
nRBC: 0.5 % — ABNORMAL HIGH (ref 0.0–0.2)

## 2023-06-04 LAB — PREPARE RBC (CROSSMATCH)

## 2023-06-04 MED ORDER — SODIUM CHLORIDE 0.9% IV SOLUTION: Freq: Once | INTRAVENOUS | Status: AC

## 2023-06-04 MED ORDER — DIPHENHYDRAMINE HCL 25 MG PO CAPS
25.0000 mg | ORAL_CAPSULE | Freq: Once | ORAL | Status: AC
  Administered 2023-06-04: 25 mg via ORAL

## 2023-06-04 MED ORDER — ACETAMINOPHEN 325 MG PO TABS
650.0000 mg | ORAL_TABLET | Freq: Once | ORAL | Status: AC
  Administered 2023-06-04: 650 mg via ORAL

## 2023-06-04 NOTE — Plan of Care (Signed)

## 2023-06-04 NOTE — Progress Notes (Signed)
 Patient ID: [name] [name]., male   DOB: [birth year], 43 y.o.   MRN: [id number] Subjective: [name] [name]  is a 43 y.o. male with history of sickle cell disease, chronic pain syndrome, opiate dependence and tolerance, anemia of chronic disease, and CKD stage IV who presented to the ER with major complaints of generalized body pain but primarily in his head and lower extremities. Patient claims he has had 1 fall in the last week, he did not sustain any cuts and bruises during these fall, however he has a little swelling on his right lateral forehead about a penny size.  no inciting factors. Now he is in severe pain, rated at 10/10, generalized but mostly bilateral lower extremity. He describes this pain as constant and achy and throbbing. He took his home medications with no sustained relief. He denies any fever, headache, cough, blurry vision, chest pain, shortness of breath, nausea, vomiting or diarrhea. No urinary symptoms. He denies any recent travels, sick contacts  Today he reports improved pain of 8/10. Will transfuse 1 unit PRBC for HGB 6.1g/dl.    Objective:  Vital signs in last 24 hours:  Vitals:   06/03/23 2347 06/04/23 0417 06/04/23 0513 06/04/23 0723  BP:   119/81   Pulse:   95   Resp: 17 16 18 14   Temp:   98.3 F (36.8 C)   TempSrc:   Oral   SpO2:   100% 97%  Weight:      Height:        Intake/Output from previous day:   Intake/Output Summary (Last 24 hours) at 06/04/2023 0941 Last data filed at 06/04/2023 0603 Gross per 24 hour  Intake 1385.03 ml  Output 1500 ml  Net -114.97 ml    Physical Exam: General: Alert, awake, oriented x3, in no acute distress.  HEENT: [place]/AT PEERL, EOMI Neck: Trachea midline,  no masses, no thyromegal,y no JVD, no carotid bruit OROPHARYNX:  Moist, No exudate/ erythema/lesions.  Heart: Regular rate and rhythm, without murmurs, rubs, gallops, PMI non-displaced, no heaves or thrills on palpation.  Lungs: Clear to auscultation, no  wheezing or rhonchi noted. No increased vocal fremitus resonant to percussion  Abdomen: Soft, nontender, nondistended, positive bowel sounds, no masses no hepatosplenomegaly noted..  Neuro: No focal neurological deficits noted cranial nerves II through XII grossly intact. DTRs 2+ bilaterally upper and lower extremities. Strength 5 out of 5 in bilateral upper and lower extremities. Musculoskeletal: Bilateral lower extremity pain  Psychiatric: Patient alert and oriented x3, good insight and cognition, good recent to remote recall. Lymph node survey: No cervical axillary or inguinal lymphadenopathy noted.  Lab Results:  Basic Metabolic Panel:    Component Value Date/Time   NA 136 06/03/2023 0738   NA 139 05/16/2023 1031   K 3.6 06/03/2023 0738   CL 101 06/03/2023 0738   CO2 26 06/03/2023 0738   BUN 46 (H) 06/03/2023 0738   BUN 35 (H) 05/16/2023 1031   CREATININE 3.13 (H) 06/03/2023 0738   CREATININE 1.45 (H) 05/12/2014 1430   GLUCOSE 71 06/03/2023 0738   CALCIUM  8.5 (L) 06/03/2023 0738   CBC:    Component Value Date/Time   WBC 10.7 (H) 06/04/2023 0622   HGB 6.1 (LL) 06/04/2023 0622   HGB 7.6 (L) 05/16/2023 1031   HCT 18.2 (L) 06/04/2023 0622   HCT 22.5 (L) 05/16/2023 1031   PLT 154 06/04/2023 0622   PLT 300 05/16/2023 1031   MCV 119.7 (H) 06/04/2023 0622   MCV 116 (H)  05/16/2023 1031   NEUTROABS 7.1 06/03/2023 0738   NEUTROABS 3.1 05/16/2023 1031   LYMPHSABS 5.2 (H) 06/03/2023 0738   LYMPHSABS 4.4 (H) 05/16/2023 1031   MONOABS 0.4 06/03/2023 0738   EOSABS 0.1 06/03/2023 0738   EOSABS 0.1 05/16/2023 1031   BASOSABS 0.0 06/03/2023 0738   BASOSABS 0.0 05/16/2023 1031    No results found for this or any previous visit (from the past 240 hours).  Studies/Results: DG Chest 2 View Result Date: 06/03/2023 CLINICAL DATA:  Shortness of breath. History of sickle cell disease. EXAM: CHEST - 2 VIEW COMPARISON:  01/29/2023 FINDINGS: The heart size appears normal. There is no sign of  pleural effusion. Similar appearance of chronic, fine, diffuse reticular interstitial opacities throughout both lungs. Diffuse bronchial wall thickening identified. Mild bilateral bronchiectasis. Scarring noted within the left base. No superimposed airspace consolidation. Advanced changes of avascular necrosis with subchondral collapse involving the right humeral head. Surgical clips noted in the right upper quadrant of the abdomen. Calcified spleen. IMPRESSION: 1. No acute cardiopulmonary abnormalities. 2. Chronic reticular interstitial opacities throughout both lungs with mild bilateral bronchiectasis. 3. Diffuse bronchial wall thickening compatible with bronchitis. 4. Advanced changes of avascular necrosis with subchondral collapse involving the right humeral head. Electronically Signed   By: Kimberley Penman M.D.   On: 06/03/2023 07:06    Medications: Scheduled Meds:  sodium chloride    Intravenous Once   acetaminophen   650 mg Oral Once   diphenhydrAMINE   25 mg Oral Once   folic acid   1 mg Oral Daily   HYDROmorphone    Intravenous Q4H   oxyCODONE   15 mg Oral Q12H   senna-docusate  1 tablet Oral BID   Continuous Infusions:  sodium chloride  100 mL/hr at 06/04/23 0320   PRN Meds:.acetaminophen , diphenhydrAMINE , naloxone  **AND** sodium chloride  flush, ondansetron  (ZOFRAN ) IV, polyethylene glycol  Consultants: None  Procedures: Blood transfusion  Antibiotics: None  Assessment/Plan: Principal Problem:   Sickle cell anemia with pain (HCC) Active Problems:   TOBACCO ABUSE   CKD (chronic kidney disease), stage IV (HCC)   Leukocytosis   Hb Sickle Cell Disease with Pain crisis: Continue IVF 0.45% Saline @ 100 mls/hour, continue weight based Dilaudid  PCA, continue oral home pain medications as ordered. Monitor vitals very closely, Re-evaluate pain scale regularly, 2 L of Oxygen  by [place]. Patient encouraged to ambulate on the hallway today.  Leukocytosis:  Elevated no s/s of acute infection.  Will continue to monitor, daily cbc in place.  Anemia of Chronic Disease: HGB is below base line @ 6.9 g/dl will repeat in the AM and transfuse if needed.  Chronic pain Syndrome:  Continue to monitor his renal function.  Creatinine is 2.85mg /dl Tobacco use disorder:Randolf was counseled on the dangers of tobacco use, and was advised to quit. Reviewed strategies to maximize success, including removing cigarettes and smoking materials from environment, stress management and support of family/friends.     Code Status: Full Code Family Communication: N/A Disposition Plan: Not yet ready for discharge  Lorel Roes NP  If 7PM-7AM, please contact night-coverage.  06/04/2023, 9:41 AM  LOS: 1 day

## 2023-06-04 NOTE — Plan of Care (Signed)
  Problem: Education: Goal: Knowledge of vaso-occlusive preventative measures will improve Outcome: Progressing Goal: Awareness of infection prevention will improve Outcome: Progressing Goal: Awareness of signs and symptoms of anemia will improve Outcome: Progressing Goal: Long-term complications will improve Outcome: Progressing   Problem: Self-Care: Goal: Ability to incorporate actions that prevent/reduce pain crisis will improve Outcome: Progressing   Problem: Bowel/Gastric: Goal: Gut motility will be maintained Outcome: Progressing   Problem: Tissue Perfusion: Goal: Complications related to inadequate tissue perfusion will be avoided or minimized Outcome: Progressing

## 2023-06-05 ENCOUNTER — Other Ambulatory Visit: Payer: Self-pay | Admitting: Nurse Practitioner

## 2023-06-05 DIAGNOSIS — D571 Sickle-cell disease without crisis: Secondary | ICD-10-CM

## 2023-06-05 DIAGNOSIS — G894 Chronic pain syndrome: Secondary | ICD-10-CM

## 2023-06-05 LAB — BASIC METABOLIC PANEL WITH GFR
Anion gap: 7 (ref 5–15)
BUN: 43 mg/dL — ABNORMAL HIGH (ref 6–20)
CO2: 19 mmol/L — ABNORMAL LOW (ref 22–32)
Calcium: 8.4 mg/dL — ABNORMAL LOW (ref 8.9–10.3)
Chloride: 105 mmol/L (ref 98–111)
Creatinine, Ser: 2.76 mg/dL — ABNORMAL HIGH (ref 0.61–1.24)
GFR, Estimated: 29 mL/min — ABNORMAL LOW (ref >60)
Glucose, Bld: 100 mg/dL — ABNORMAL HIGH (ref 70–99)
Potassium: 5 mmol/L (ref 3.5–5.1)
Sodium: 131 mmol/L — ABNORMAL LOW (ref 135–145)

## 2023-06-05 LAB — CBC
HCT: 23.9 % — ABNORMAL LOW (ref 39.0–52.0)
Hemoglobin: 8 g/dL — ABNORMAL LOW (ref 13.0–17.0)
MCH: 37.4 pg — ABNORMAL HIGH (ref 26.0–34.0)
MCHC: 33.5 g/dL (ref 30.0–36.0)
MCV: 111.7 fL — ABNORMAL HIGH (ref 80.0–100.0)
Platelets: 172 10*3/uL (ref 150–400)
RBC: 2.14 MIL/uL — ABNORMAL LOW (ref 4.22–5.81)
RDW: 26.5 % — ABNORMAL HIGH (ref 11.5–15.5)
WBC: 10.7 10*3/uL — ABNORMAL HIGH (ref 4.0–10.5)
nRBC: 0.6 % — ABNORMAL HIGH (ref 0.0–0.2)

## 2023-06-05 LAB — TYPE AND SCREEN
ABO/RH(D): O POS
Antibody Screen: NEGATIVE
Donor AG Type: NEGATIVE
Donor AG Type: NEGATIVE
Unit division: 0
Unit division: 0

## 2023-06-05 LAB — BPAM RBC
Blood Product Expiration Date: 202505212359
Blood Product Expiration Date: 202505242359
ISSUE DATE / TIME: 202504211415
Unit Type and Rh: 5100
Unit Type and Rh: 5100

## 2023-06-05 MED ORDER — OXYCODONE HCL 10 MG PO TABS: 10.0000 mg | ORAL_TABLET | ORAL | 0 refills | Status: DC

## 2023-06-05 NOTE — Plan of Care (Signed)

## 2023-06-05 NOTE — Progress Notes (Signed)
 RX refill sent to [facility]. PDMP website reviewed.

## 2023-06-05 NOTE — Plan of Care (Signed)
  Problem: Education: Goal: Knowledge of vaso-occlusive preventative measures will improve Outcome: Completed/Met Goal: Awareness of infection prevention will improve Outcome: Completed/Met Goal: Awareness of signs and symptoms of anemia will improve Outcome: Completed/Met Goal: Long-term complications will improve Outcome: Completed/Met   Problem: Self-Care: Goal: Ability to incorporate actions that prevent/reduce pain crisis will improve Outcome: Completed/Met   Problem: Bowel/Gastric: Goal: Gut motility will be maintained Outcome: Completed/Met   Problem: Tissue Perfusion: Goal: Complications related to inadequate tissue perfusion will be avoided or minimized Outcome: Completed/Met   Problem: Respiratory: Goal: Pulmonary complications will be avoided or minimized Outcome: Completed/Met Goal: Acute Chest Syndrome will be identified early to prevent complications Outcome: Completed/Met   Problem: Fluid Volume: Goal: Ability to maintain a balanced intake and output will improve Outcome: Completed/Met   Problem: Sensory: Goal: Pain level will decrease with appropriate interventions Outcome: Completed/Met   Problem: Health Behavior: Goal: Postive changes in compliance with treatment and prescription regimens will improve Outcome: Completed/Met   Problem: Education: Goal: Knowledge of General Education information will improve Description: Including pain rating scale, medication(s)/side effects and non-pharmacologic comfort measures Outcome: Completed/Met   Problem: Health Behavior/Discharge Planning: Goal: Ability to manage health-related needs will improve Outcome: Completed/Met   Problem: Clinical Measurements: Goal: Ability to maintain clinical measurements within normal limits will improve Outcome: Completed/Met Goal: Will remain free from infection Outcome: Completed/Met Goal: Diagnostic test results will improve Outcome: Completed/Met Goal: Respiratory  complications will improve Outcome: Completed/Met Goal: Cardiovascular complication will be avoided Outcome: Completed/Met   Problem: Activity: Goal: Risk for activity intolerance will decrease Outcome: Completed/Met   Problem: Nutrition: Goal: Adequate nutrition will be maintained Outcome: Completed/Met   Problem: Coping: Goal: Level of anxiety will decrease Outcome: Completed/Met   Problem: Elimination: Goal: Will not experience complications related to bowel motility Outcome: Completed/Met Goal: Will not experience complications related to urinary retention Outcome: Completed/Met   Problem: Pain Managment: Goal: General experience of comfort will improve and/or be controlled Outcome: Completed/Met   Problem: Safety: Goal: Ability to remain free from injury will improve Outcome: Completed/Met   Problem: Skin Integrity: Goal: Risk for impaired skin integrity will decrease Outcome: Completed/Met

## 2023-06-05 NOTE — Discharge Summary (Cosign Needed Addendum)
 Physician Discharge Summary  [name] [name]. WUJ:[id number] DOB: May 12, 1980 DOA: 06/03/2023  PCP: [name], NP  Admit date: 06/03/2023  Discharge date: 06/05/2023  Discharge Diagnoses:  Principal Problem:   Sickle cell anemia with pain (HCC) Active Problems:   TOBACCO ABUSE   CKD (chronic kidney disease), stage IV (HCC)   Leukocytosis   Discharge Condition: Stable  Disposition:  Pt is discharged home in good condition and is to follow up with [name], NP this week to have labs evaluated. [name] A Morefield Jr. is instructed to increase activity slowly and balance with rest for the next few days, and use prescribed medication to complete treatment of pain  Diet: Regular Wt Readings from Last 3 Encounters:  06/03/23 55.2 kg  04/28/23 56.7 kg  04/26/23 56.5 kg    History of present illness:  [name] [name]  is a 43 y.o. male with history of sickle cell disease, chronic pain syndrome, opiate dependence and tolerance, anemia of chronic disease, and CKD stage IV who presented to the ER today with major complaints of generalized body pain but primarily in his head and lower extremities. Patient claims he has had 1 fall in the last week, he did not sustain any cuts and bruises during these fall, however he has a little swelling on his right lateral forehead about a penny size.  no inciting factors. Now he is in severe pain, rated at 10/10, generalized but mostly bilateral lower extremity. He describes this pain as constant and achy and throbbing. He took his home medications with no sustained relief. He denies any fever, headache, cough, blurry vision, chest pain, shortness of breath, nausea, vomiting or diarrhea. No urinary symptoms. He denies any recent travels, sick contacts   ED Course: BP 115/80  Pulse (!) 101  Temp 98 F (36.7 C) (Oral)  Resp 14  Ht 1.6 m (5\' 3" )  Wt 54.4 kg  SpO2 100%  BMI 21.26 kg/m HGB 6.9 g/dl WBC 95.6 Pain uncontrolled with  dilaudid  and hydration. Patient is admitted for ongoing sickle cell pain management with anemia.    Hospital Course:  Patient was admitted for sickle cell pain crisis and managed appropriately with IVF, IV Dilaudid  via PCA as well as other adjunct therapies per sickle cell pain management protocols. Patient completed PRBC transfusion with no complications. Hgb @ 8.0 g/dl. Patient reports significant improvement in pain from 9/10 to 3/10. He is ambulating without difficulty and tolerating P.O without any obstructions. Patient was therefore discharged home today in a hemodynamically stable condition.   Tolbert will follow-up with PCP within 1 week of this discharge. Andree was counseled extensively about nonpharmacologic means of pain management, patient verbalized understanding and was appreciative of  the care received during this admission.   We discussed the need for good hydration, monitoring of hydration status, avoidance of heat, cold, stress, and infection triggers. We discussed the need to be adherent with taking other home medications. Patient was reminded of the need to seek medical attention immediately if any symptom of bleeding, anemia, or infection occurs.  Discharge Exam: Vitals:   06/05/23 0356 06/05/23 0728  BP:    Pulse:    Resp: 16 17  Temp:    SpO2:     Vitals:   06/04/23 2335 06/05/23 0123 06/05/23 0356 06/05/23 0728  BP:  (!) 133/90    Pulse:  87    Resp: 16 15 16 17   Temp:  98.1 F (36.7 C)    TempSrc:  Oral    SpO2:  97%    Weight:      Height:        General appearance : Awake, alert, not in any distress. Speech Clear. Not toxic looking HEENT: Atraumatic and Normocephalic, pupils equally reactive to light and accomodation Neck: Supple, no JVD. No cervical lymphadenopathy.  Chest: Good air entry bilaterally, no added sounds  CVS: S1 S2 regular, no murmurs.  Abdomen: Bowel sounds present, Non tender and not distended with no gaurding, rigidity or  rebound. Extremities: B/L Lower Ext shows no edema, both legs are warm to touch Neurology: Awake alert, and oriented X 3, CN II-XII intact, Non focal Skin: No Rash  Discharge Instructions  Discharge Instructions     Call MD for:  severe uncontrolled pain   Complete by: As directed    Call MD for:  temperature >100.4   Complete by: As directed    Diet - low sodium heart healthy   Complete by: As directed    Increase activity slowly   Complete by: As directed       Allergies as of 06/05/2023       Reactions   Nsaids Other (See Comments)   Kidney disease   Morphine  And Codeine  Other (See Comments)   "Really bad headaches"         Medication List     TAKE these medications    amitriptyline  25 MG tablet Commonly known as: ELAVIL  TAKE 2 TABLETS(50 MG) BY MOUTH AT BEDTIME What changed: See the new instructions.   calcitRIOL  0.25 MCG capsule Commonly known as: ROCALTROL  Take 0.25 mcg by mouth daily with breakfast.   Ferriprox  Twice-A-Day 1000 MG Tabs Generic drug: Deferiprone  (Twice Daily) Take 3,000 mg by mouth daily.   folic acid  1 MG tablet Commonly known as: FOLVITE  TAKE 1 TABLET BY MOUTH EVERY DAY   hydroxyurea  500 MG capsule Commonly known as: HYDREA  Take 1 capsule by mouth in the morning on Sun/Tues/Wed/Thurs/Sat and 2 capsules on Mon/Fri What changed: additional instructions   oxyCODONE  15 mg 12 hr tablet Commonly known as: OXYCONTIN  Take 1 tablet (15 mg) by mouth every 12 hours.   sodium bicarbonate  650 MG tablet Take 1,950 mg by mouth with breakfast, with lunch, and with evening meal.   sodium zirconium cyclosilicate  10 g Pack packet Commonly known as: LOKELMA  Take 20 g by mouth daily.   Tylenol  8 Hour Arthritis Pain 650 MG CR tablet Generic drug: acetaminophen  Take 650 mg by mouth every 8 (eight) hours as needed for pain.        The results of significant diagnostics from this hospitalization (including imaging, microbiology, ancillary  and laboratory) are listed below for reference.    Significant Diagnostic Studies: DG Chest 2 View Result Date: 06/03/2023 CLINICAL DATA:  Shortness of breath. History of sickle cell disease. EXAM: CHEST - 2 VIEW COMPARISON:  01/29/2023 FINDINGS: The heart size appears normal. There is no sign of pleural effusion. Similar appearance of chronic, fine, diffuse reticular interstitial opacities throughout both lungs. Diffuse bronchial wall thickening identified. Mild bilateral bronchiectasis. Scarring noted within the left base. No superimposed airspace consolidation. Advanced changes of avascular necrosis with subchondral collapse involving the right humeral head. Surgical clips noted in the right upper quadrant of the abdomen. Calcified spleen. IMPRESSION: 1. No acute cardiopulmonary abnormalities. 2. Chronic reticular interstitial opacities throughout both lungs with mild bilateral bronchiectasis. 3. Diffuse bronchial wall thickening compatible with bronchitis. 4. Advanced changes of avascular necrosis with subchondral collapse involving the  right humeral head. Electronically Signed   By: Kimberley Penman M.D.   On: 06/03/2023 07:06   CT HEAD WO CONTRAST ( ) Result Date: 05/09/2023 CLINICAL DATA:  43 year old male with recurrent falls. Dizziness. Ataxia. Sickle cell disease. EXAM: CT HEAD WITHOUT CONTRAST TECHNIQUE: Contiguous axial images were obtained from the base of the skull through the vertex without intravenous contrast. RADIATION DOSE REDUCTION: This exam was performed according to the departmental dose-optimization program which includes automated exposure control, adjustment of the mA and/or kV according to patient size and/or use of iterative reconstruction technique. COMPARISON:  Brain MRI 02/22/2007.  Head CT 03/23/2020. FINDINGS: Brain: Left insula, sylvian fissure arachnoid cyst (favored) versus chronic encephalomalacia is unchanged since at least 2009. Stable cerebral volume. No midline shift,  ventriculomegaly, mass effect, evidence of mass lesion, intracranial hemorrhage or evidence of cortically based acute infarction. Tiny chronic left cerebellar lacunar infarct series 4 image 60, not apparent previously. Otherwise stable gray-white differentiation. Vascular: No suspicious intracranial vascular hyperdensity. Skull: Stable, intact. Sinuses/Orbits: Substantially improved paranasal sinus aeration since 2022. Minimal fluid or mucosal thickening in the left sphenoid. Hyperplastic mastoids, petrous and sphenoid wing air cells, tympanic cavities are clear. Other: Disconjugate gaze and motion artifact of the globes. Visualized scalp soft tissues are within normal limits. IMPRESSION: 1. No acute intracranial abnormality. 2. A tiny chronic appearing left cerebellar infarct is new since 2022. 3. Otherwise Stable non contrast CT appearance of the brain, including probable chronic/congenital arachnoid cyst at the left insula, operculum. Electronically Signed   By: Marlise Simpers M.D.   On: 05/09/2023 11:00    Microbiology: No results found for this or any previous visit (from the past 240 hours).   Labs: Basic Metabolic Panel: Recent Labs  Lab 06/03/23 0738 06/05/23 0630  NA 136 131*  K 3.6 5.0  CL 101 105  CO2 26 19*  GLUCOSE 71 100*  BUN 46* 43*  CREATININE 3.13* 2.76*  CALCIUM  8.5* 8.4*   Liver Function Tests: Recent Labs  Lab 06/03/23 0738  AST 54*  ALT 39  ALKPHOS 219*  BILITOT 0.9  PROT 7.1  ALBUMIN  2.8*   No results for input(s): "LIPASE", "AMYLASE" in the last 168 hours. No results for input(s): "AMMONIA" in the last 168 hours. CBC: Recent Labs  Lab 06/03/23 0738 06/04/23 0622 06/04/23 2045 06/05/23 0620  WBC 12.9* 10.7*  --  10.7*  NEUTROABS 7.1  --   --   --   HGB 6.9* 6.1* 7.8* 8.0*  HCT 20.0* 18.2* 23.3* 23.9*  MCV 117.0* 119.7*  --  111.7*  PLT 169 154  --  172   Cardiac Enzymes: No results for input(s): "CKTOTAL", "CKMB", "CKMBINDEX", "TROPONINI" in the last  168 hours. BNP: Invalid input(s): "POCBNP" CBG: No results for input(s): "GLUCAP" in the last 168 hours.  Time coordinating discharge: 50 minutes  Signed:  Lorel Roes NP   06/05/2023, 1:53 PM

## 2023-06-06 ENCOUNTER — Telehealth: Payer: Self-pay | Admitting: *Deleted

## 2023-06-06 NOTE — Transitions of Care (Post Inpatient/ED Visit) (Signed)
   06/06/2023  Name: [name] [name]. MRN: [id number] DOB: 21-Nov-1980  Today's TOC FU Call Status: Today's TOC FU Call Status:: Unsuccessful Call (1st Attempt) Unsuccessful Call (1st Attempt) Date: 06/06/23  Attempted to reach the patient regarding the most recent Inpatient/ED visit.  Follow Up Plan: Additional outreach attempts will be made to reach the patient to complete the Transitions of Care (Post Inpatient/ED visit) call.   Cecilie Coffee [facility], BSN RN Care Manager/ Transition of Care [place]/ [facility] [phone]

## 2023-06-07 ENCOUNTER — Telehealth: Payer: Self-pay

## 2023-06-07 NOTE — Transitions of Care (Post Inpatient/ED Visit) (Signed)
   06/07/2023  Name: [name]. MRN: [id number] DOB: 1980/06/24  Today's TOC FU Call Status:    Attempted to reach the patient regarding the most recent Inpatient/ED visit.  Follow Up Plan: Additional outreach attempts will be made to reach the patient to complete the Transitions of Care (Post Inpatient/ED visit) call.   Orpha Blade, RN, BSN, CEN [employer]- Transition of Care Team.  [facility] [phone]

## 2023-06-08 ENCOUNTER — Telehealth: Payer: Self-pay

## 2023-06-08 ENCOUNTER — Other Ambulatory Visit

## 2023-06-08 NOTE — Transitions of Care (Post Inpatient/ED Visit) (Signed)
   06/08/2023  Name: [name] [name]. MRN: [id number] DOB: 09-24-1980  Today's TOC FU Call Status: Today's TOC FU Call Status:: Unsuccessful Call (3rd Attempt) Unsuccessful Call (3rd Attempt) Date: 06/08/23  Attempted to reach the patient regarding the most recent Inpatient/ED visit.  Follow Up Plan: No further outreach attempts will be made at this time. We have been unable to contact the patient.  Orpha Blade, RN, BSN, CEN [employer]- Transition of Care Team.  [facility] [phone]

## 2023-06-11 ENCOUNTER — Other Ambulatory Visit: Payer: Self-pay | Admitting: Nurse Practitioner

## 2023-06-11 ENCOUNTER — Other Ambulatory Visit

## 2023-06-11 DIAGNOSIS — D5700 Hb-SS disease with crisis, unspecified: Secondary | ICD-10-CM

## 2023-06-15 ENCOUNTER — Other Ambulatory Visit

## 2023-06-15 ENCOUNTER — Other Ambulatory Visit: Payer: Self-pay | Admitting: Nurse Practitioner

## 2023-06-16 LAB — CBC
Hematocrit: 26.2 % — ABNORMAL LOW (ref 37.5–51.0)
Hemoglobin: 8.7 g/dL — ABNORMAL LOW (ref 13.0–17.7)
MCH: 37.7 pg — ABNORMAL HIGH (ref 26.6–33.0)
MCHC: 33.2 g/dL (ref 31.5–35.7)
MCV: 113 fL — ABNORMAL HIGH (ref 79–97)
Platelets: 393 10*3/uL (ref 150–450)
RBC: 2.31 x10E6/uL — CL (ref 4.14–5.80)
WBC: 10.9 10*3/uL — ABNORMAL HIGH (ref 3.4–10.8)

## 2023-06-19 ENCOUNTER — Other Ambulatory Visit: Payer: Self-pay | Admitting: Nurse Practitioner

## 2023-06-19 DIAGNOSIS — G894 Chronic pain syndrome: Secondary | ICD-10-CM

## 2023-06-19 DIAGNOSIS — D571 Sickle-cell disease without crisis: Secondary | ICD-10-CM

## 2023-06-19 NOTE — Telephone Encounter (Signed)
 Copied from CRM [id number]. Topic: Clinical - Medication Refill >> Jun 19, 2023  9:39 AM Star [place] wrote: Most Recent Primary Care Visit:  Provider: [facility]  Department: [facility]  Visit Type: LAB VISIT  Date: 06/15/2023  Medication: oxyCODONE  (OXYCONTIN ) 15 mg 12 hr tablet Oxycodone  HCl 10 MG TABS  Has the patient contacted their pharmacy? No (Agent: If no, request that the patient contact the pharmacy for the refill. If patient does not wish to contact the pharmacy document the reason why and proceed with request.) (Agent: If yes, when and what did the pharmacy advise?)  Is this the correct pharmacy for this prescription? Yes If no, delete pharmacy and type the correct one.  This is the patient's preferred pharmacy:  [facility] [address] [place] - [address] AT [address] [place] [address] Phone: [phone] Fax: [phone]   Has the prescription been filled recently? Yes  Is the patient out of the medication? No  Has the patient been seen for an appointment in the last year OR does the patient have an upcoming appointment? Yes  Can we respond through MyChart? Yes  Agent: Please be advised that Rx refills may take up to 3 business days. We ask that you follow-up with your pharmacy.

## 2023-06-19 NOTE — H&P (Signed)
 Erroneous encounter H&P documented on 06/03/2023 as progress note.

## 2023-06-21 ENCOUNTER — Other Ambulatory Visit: Payer: Self-pay

## 2023-06-21 DIAGNOSIS — G894 Chronic pain syndrome: Secondary | ICD-10-CM

## 2023-06-21 DIAGNOSIS — D571 Sickle-cell disease without crisis: Secondary | ICD-10-CM

## 2023-06-21 MED ORDER — OXYCODONE HCL ER 15 MG PO T12A: 15.0000 mg | EXTENDED_RELEASE_TABLET | Freq: Two times a day (BID) | ORAL | 0 refills | Status: DC

## 2023-06-21 MED ORDER — OXYCODONE HCL 10 MG PO TABS: 10.0000 mg | ORAL_TABLET | ORAL | 0 refills | Status: DC

## 2023-06-21 NOTE — Telephone Encounter (Signed)
 Please advise [facility]

## 2023-07-03 ENCOUNTER — Encounter: Payer: Self-pay | Admitting: Internal Medicine

## 2023-07-03 ENCOUNTER — Ambulatory Visit: Payer: Self-pay | Admitting: Internal Medicine

## 2023-07-03 VITALS — BP 115/81 | HR 103 | Temp 97.5°F | Wt 132.0 lb

## 2023-07-03 DIAGNOSIS — N184 Chronic kidney disease, stage 4 (severe): Secondary | ICD-10-CM

## 2023-07-03 DIAGNOSIS — F1190 Opioid use, unspecified, uncomplicated: Secondary | ICD-10-CM | POA: Insufficient documentation

## 2023-07-03 DIAGNOSIS — D5700 Hb-SS disease with crisis, unspecified: Secondary | ICD-10-CM

## 2023-07-03 NOTE — Progress Notes (Signed)
 [name] [name], is a 43 y.o. male  RDW:[id number]  FMW:[id number]  DOB - 1980/02/20  Chief Complaint  Patient presents with   Medical Management of Chronic Issues      Subjective:   Alton Bouknight is a 43 y.o. male with medical history significant for sickle cell disease, chronic pain syndrome, opiate dependence and tolerance, anemia of chronic disease, CKD stage IV, polysubstance use disorder including cocaine , who presented here today for a follow up visit and medication management.  Patient is requesting that we will restart prescribing his medications.  Patient has tested positive for cocaine  in the past and because of recurrent use of cocaine , his opiate prescription was stopped.  Patient is presenting today for urine drug screen and for possible restart of his prescriptions.  He has no new complaint today.  Patient follows up with [facility] for his CKD and hematology.  He claims adherence to other medications for sickle cell disease.  Patient has No headache, No chest pain, No abdominal pain - No Nausea, No new weakness tingling or numbness, No Cough - SOB.  Problem  Opioid Use    ALLERGIES: Allergies  Allergen Reactions   Nsaids Other (See Comments)    Kidney disease   Morphine  And Codeine  Other (See Comments)    Really bad headaches     PAST MEDICAL HISTORY: Past Medical History:  Diagnosis Date   Abscess of right iliac fossa 09/24/2018   required Perc Drain 09/24/2018   Arachnoid Cyst of brain bilaterally    2 really small ones in the back of my head; inside; saw them w/MRI (09/25/2012)   Bacterial pneumonia ~ 2012   caught it here in the hospital (09/25/2012)   Chronic kidney disease    from my sickle cell (09/25/2012)   CKD (chronic kidney disease), stage II    Colitis 04/19/2017   CT scan abd/ pelvis   GERD (gastroesophageal reflux disease)    after I eat alot of spicey foods (09/25/2012)   Gynecomastia, male 07/10/2012   History  of blood transfusion    always related to sickle cell crisis (09/25/2012)   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   Migraines    take RX qd to prevent them (09/25/2012)   Opioid dependence with withdrawal (HCC) 08/30/2012   Renal insufficiency    Sickle cell anemia (HCC)    Sickle cell crisis (HCC) 09/25/2012   Sickle cell nephropathy (HCC) 07/10/2012   Sinus tachycardia    Tachycardia with heart rate 121-140 beats per minute with ambulation 08/04/2016    MEDICATIONS AT HOME: Prior to Admission medications   Medication Sig Start Date End Date Taking? Authorizing Provider  calcitRIOL  (ROCALTROL ) 0.25 MCG capsule Take 0.25 mcg by mouth daily with breakfast. 11/16/20  Yes [provider]  Deferiprone , Twice Daily, (FERRIPROX  TWICE-A-DAY) 1000 MG TABS Take 3,000 mg by mouth daily. 05/01/22  Yes Tilford Bertram HERO, FNP  folic acid  (FOLVITE ) 1 MG tablet TAKE 1 TABLET BY MOUTH EVERY DAY 03/22/20  Yes Hollis, Lachina M, FNP  hydroxyurea  (HYDREA ) 500 MG capsule Take 1 capsule by mouth in the morning on Sun/Tues/Wed/Thurs/Sat and 2 capsules on Mon/Fri Patient taking differently: Take 500-1,000 mg by mouth See admin instructions. Take 500 mg by mouth on Sun/Tues/Wed/Fri/Sat and 1,000 mg on Mon/Thurs 01/25/23  Yes Nichols, [name], NP  oxyCODONE  (OXYCONTIN ) 15 mg 12 hr tablet Take 1 tablet (15 mg) by mouth every 12 hours.  06/27/23  Yes [name], NP  Oxycodone  HCl 10 MG TABS Take 1 tablet (10 mg total) by mouth See admin instructions. Take 10 mg by mouth every 6 hours 06/21/23  Yes [name], NP  sodium bicarbonate  650 MG tablet Take 1,950 mg by mouth with breakfast, with lunch, and with evening meal. 11/07/18  Yes [provider]  sodium zirconium cyclosilicate  (LOKELMA ) 10 g PACK packet Take 20 g by mouth daily. 04/21/23 04/22/24 Yes [provider]  TYLENOL  8 HOUR ARTHRITIS PAIN 650 MG CR tablet Take 650 mg  by mouth every 8 (eight) hours as needed for pain.   Yes [provider]  amitriptyline  (ELAVIL ) 25 MG tablet TAKE 2 TABLETS(50 MG) BY MOUTH AT BEDTIME Patient taking differently: Take 75 mg by mouth at bedtime. 05/24/23   [name], NP    Objective:   Vitals:   07/03/23 1016  BP: 115/81  Pulse: (!) 103  Temp: (!) 97.5 F (36.4 C)  TempSrc: Oral  SpO2: 100%  Weight: 132 lb (59.9 kg)   Exam General appearance : Awake, alert, not in any distress. Speech Clear. Not toxic looking HEENT: Atraumatic and Normocephalic, pupils equally reactive to light and accomodation Neck: Supple, no JVD. No cervical lymphadenopathy.  Chest: Good air entry bilaterally, no added sounds  CVS: S1 S2 regular, no murmurs.  Abdomen: Bowel sounds present, Non tender and not distended with no gaurding, rigidity or rebound. Extremities: B/L Lower Ext shows no edema, both legs are warm to touch Neurology: Awake alert, and oriented X 3, CN II-XII intact, Non focal Skin: No Rash  Data Review Lab Results  Component Value Date   HGBA1C 8.3 03/07/2023   HGBA1C <4.2 (L) 03/07/2018   HGBA1C <4.0 (L) 03/11/2015    Assessment & Plan   1. Sickle cell anemia with pain (HCC) (Primary) Continue Hydrea . We discussed the need for good hydration, monitoring of hydration status, avoidance of heat, cold, stress, and infection triggers. We discussed the risks and benefits of Hydrea , including bone marrow suppression, the possibility of GI upset, skin ulcers, hair thinning, and teratogenicity. The patient was reminded of the need to seek medical attention for any symptoms of bleeding, anemia, or infection. Continue folic acid  1 mg daily to prevent aplastic bone marrow crises.  Pulmonary evaluation - Patient denies severe recurrent wheezes, shortness of breath with exercise, or persistent cough. If these symptoms develop, pulmonary function tests with spirometry will be ordered, and if abnormal, plan on referral  to Pulmonology for further evaluation.  Eye - High risk of proliferative retinopathy. Annual eye exam with retinal exam recommended to patient, the patient has had eye exam this year.  Immunization status - Yearly influenza vaccination is recommended, as well as being up to date with Meningococcal and Pneumococcal vaccines.   2. Opioid use  - Drug Screen, Urine  Will follow-up with patient with result of urine drug screen today.  If urine persistently tested positive for cocaine , we will not prescribe opiate.  3. CKD (chronic kidney disease), stage IV (HCC)  Avoid all nephrotoxins. Continue biweekly Aranesp  injection.  Patient have been counseled extensively about nutrition and exercise. Other issues discussed during this visit include: low cholesterol diet, annual eye examinations at Ophthalmology, importance of adherence with medications and regular follow-up.   Return in about 4 weeks (around 07/31/2023) for Routine Follow Up, Sickle Cell Disease/Pain, CKD/ESRD.  The patient was given clear instructions to go to ER or return to medical center  if symptoms don't improve, worsen or new problems develop. The patient verbalized understanding. The patient was told to call to get lab results if they haven't heard anything in the next week.   This note has been created with Education officer, environmental. Any transcriptional errors are unintentional.    Precious Schatz, MD, MHA, GENI BEL, CPE [facility] [place], [place] [phone]   07/03/2023, 11:01 AM

## 2023-07-07 LAB — TOXASSURE FLEX 15, UR
6-ACETYLMORPHINE IA: NEGATIVE ng/mL
7-aminoclonazepam: NOT DETECTED ng/mg{creat}
AMPHETAMINES IA: NEGATIVE ng/mL
Alpha-hydroxyalprazolam: NOT DETECTED ng/mg{creat}
Alpha-hydroxymidazolam: NOT DETECTED ng/mg{creat}
Alpha-hydroxytriazolam: NOT DETECTED ng/mg{creat}
Alprazolam: NOT DETECTED ng/mg{creat}
BARBITURATES IA: NEGATIVE ng/mL
BUPRENORPHINE: NEGATIVE
Benzodiazepines: NEGATIVE
Buprenorphine: NOT DETECTED ng/mg{creat}
CANNABINOIDS IA: NEGATIVE ng/mL
Clonazepam: NOT DETECTED ng/mg{creat}
Creatinine: 48 mg/dL (ref >=20)
Desalkylflurazepam: NOT DETECTED ng/mg{creat}
Desmethyldiazepam: NOT DETECTED ng/mg{creat}
Desmethylflunitrazepam: NOT DETECTED ng/mg{creat}
Diazepam: NOT DETECTED ng/mg{creat}
ETHYL ALCOHOL Enzymatic: NEGATIVE g/dL
FENTANYL: NEGATIVE
Fentanyl: NOT DETECTED ng/mg{creat}
Flunitrazepam: NOT DETECTED ng/mg{creat}
Lorazepam: NOT DETECTED ng/mg{creat}
METHADONE IA: NEGATIVE ng/mL
METHADONE MTB IA: NEGATIVE ng/mL
Midazolam: NOT DETECTED ng/mg{creat}
Norbuprenorphine: NOT DETECTED ng/mg{creat}
Norfentanyl: NOT DETECTED ng/mg{creat}
Oxazepam: NOT DETECTED ng/mg{creat}
PHENCYCLIDINE IA: NEGATIVE ng/mL
TAPENTADOL, IA: NEGATIVE ng/mL
TRAMADOL IA: NEGATIVE ng/mL
Temazepam: NOT DETECTED ng/mg{creat}

## 2023-07-07 LAB — OXYCODONE CLASS, MS, UR RFX
Noroxycodone: 1731 ng/mg{creat}
Noroxymorphone: 454 ng/mg{creat}
Oxycodone Class Confirmation: POSITIVE
Oxycodone: 1033 ng/mg{creat}
Oxymorphone: 935 ng/mg{creat}

## 2023-07-07 LAB — OPIATE CLASS, MS, UR RFX
Codeine: NOT DETECTED ng/mg{creat}
Dihydrocodeine: NOT DETECTED ng/mg{creat}
Hydrocodone: NOT DETECTED ng/mg{creat}
Hydromorphone: NOT DETECTED ng/mg{creat}
Morphine: NOT DETECTED ng/mg{creat}
Norcodeine: NOT DETECTED ng/mg{creat}
Norhydrocodone: NOT DETECTED ng/mg{creat}
Normorphine: NOT DETECTED ng/mg{creat}
Opiate Class Confirmation: NEGATIVE

## 2023-07-07 LAB — COCAINE AND MTB, MS, UR RFX
Benzoylecgonine: >10417 ng/mg{creat}
Cocaethylene: NOT DETECTED ng/mg{creat}
Cocaine Confirmation: POSITIVE
Cocaine: NOT DETECTED ng/mg{creat}

## 2023-07-10 ENCOUNTER — Observation Stay (HOSPITAL_COMMUNITY)
Admission: EM | Admit: 2023-07-10 | Discharge: 2023-07-11 | Disposition: A | Discharge status: Home / Self Care | Attending: Emergency Medicine | Admitting: Emergency Medicine

## 2023-07-10 ENCOUNTER — Telehealth: Payer: Self-pay | Admitting: Nurse Practitioner

## 2023-07-10 ENCOUNTER — Telehealth (HOSPITAL_COMMUNITY): Payer: Self-pay

## 2023-07-10 ENCOUNTER — Other Ambulatory Visit: Payer: Self-pay

## 2023-07-10 ENCOUNTER — Encounter (HOSPITAL_COMMUNITY): Payer: Self-pay

## 2023-07-10 DIAGNOSIS — G894 Chronic pain syndrome: Secondary | ICD-10-CM | POA: Diagnosis present

## 2023-07-10 DIAGNOSIS — D5700 Hb-SS disease with crisis, unspecified: Principal | ICD-10-CM | POA: Diagnosis present

## 2023-07-10 DIAGNOSIS — R079 Chest pain, unspecified: Secondary | ICD-10-CM | POA: Diagnosis present

## 2023-07-10 DIAGNOSIS — D57219 Sickle-cell/Hb-C disease with crisis, unspecified: Principal | ICD-10-CM | POA: Insufficient documentation

## 2023-07-10 DIAGNOSIS — N184 Chronic kidney disease, stage 4 (severe): Secondary | ICD-10-CM | POA: Diagnosis present

## 2023-07-10 DIAGNOSIS — G8929 Other chronic pain: Secondary | ICD-10-CM | POA: Insufficient documentation

## 2023-07-10 DIAGNOSIS — D72829 Elevated white blood cell count, unspecified: Secondary | ICD-10-CM | POA: Diagnosis not present

## 2023-07-10 DIAGNOSIS — F17200 Nicotine dependence, unspecified, uncomplicated: Secondary | ICD-10-CM | POA: Diagnosis present

## 2023-07-10 DIAGNOSIS — Z87891 Personal history of nicotine dependence: Secondary | ICD-10-CM | POA: Insufficient documentation

## 2023-07-10 LAB — CBC WITH DIFFERENTIAL/PLATELET
Abs Immature Granulocytes: 0.05 10*3/uL (ref 0.00–0.07)
Basophils Absolute: 0 10*3/uL (ref 0.0–0.1)
Basophils Relative: 0 %
Eosinophils Absolute: 0.1 10*3/uL (ref 0.0–0.5)
Eosinophils Relative: 1 %
HCT: 18.8 % — ABNORMAL LOW (ref 39.0–52.0)
Hemoglobin: 6.1 g/dL — CL (ref 13.0–17.0)
Immature Granulocytes: 0 %
Lymphocytes Relative: 47 %
Lymphs Abs: 5.6 10*3/uL — ABNORMAL HIGH (ref 0.7–4.0)
MCH: 39.9 pg — ABNORMAL HIGH (ref 26.0–34.0)
MCHC: 32.4 g/dL (ref 30.0–36.0)
MCV: 122.9 fL — ABNORMAL HIGH (ref 80.0–100.0)
Monocytes Absolute: 0.4 10*3/uL (ref 0.1–1.0)
Monocytes Relative: 3 %
Neutro Abs: 5.6 10*3/uL (ref 1.7–7.7)
Neutrophils Relative %: 49 %
Platelets: 193 10*3/uL (ref 150–400)
RBC: 1.53 MIL/uL — ABNORMAL LOW (ref 4.22–5.81)
WBC: 11.8 10*3/uL — ABNORMAL HIGH (ref 4.0–10.5)
nRBC: 0.3 % — ABNORMAL HIGH (ref 0.0–0.2)

## 2023-07-10 LAB — COMPREHENSIVE METABOLIC PANEL WITH GFR
ALT: 52 U/L — ABNORMAL HIGH (ref 0–44)
AST: 98 U/L — ABNORMAL HIGH (ref 15–41)
Albumin: 2.7 g/dL — ABNORMAL LOW (ref 3.5–5.0)
Alkaline Phosphatase: 216 U/L — ABNORMAL HIGH (ref 38–126)
Anion gap: 9 (ref 5–15)
BUN: 43 mg/dL — ABNORMAL HIGH (ref 6–20)
CO2: 29 mmol/L (ref 22–32)
Calcium: 8.3 mg/dL — ABNORMAL LOW (ref 8.9–10.3)
Chloride: 99 mmol/L (ref 98–111)
Creatinine, Ser: 2.98 mg/dL — ABNORMAL HIGH (ref 0.61–1.24)
GFR, Estimated: 26 mL/min — ABNORMAL LOW (ref >60)
Glucose, Bld: 91 mg/dL (ref 70–99)
Potassium: 4 mmol/L (ref 3.5–5.1)
Sodium: 137 mmol/L (ref 135–145)
Total Bilirubin: 1.1 mg/dL (ref 0.0–1.2)
Total Protein: 6.5 g/dL (ref 6.5–8.1)

## 2023-07-10 LAB — TROPONIN I (HIGH SENSITIVITY)
Troponin I (High Sensitivity): 4 ng/L (ref <18)
Troponin I (High Sensitivity): 5 ng/L (ref <18)

## 2023-07-10 LAB — RAPID URINE DRUG SCREEN, HOSP PERFORMED
Amphetamines: NOT DETECTED
Barbiturates: NOT DETECTED
Benzodiazepines: NOT DETECTED
Cocaine: POSITIVE — AB
Opiates: POSITIVE — AB
Tetrahydrocannabinol: NOT DETECTED

## 2023-07-10 LAB — PREPARE RBC (CROSSMATCH)

## 2023-07-10 LAB — RETICULOCYTES
Immature Retic Fract: 37.8 % — ABNORMAL HIGH (ref 2.3–15.9)
RBC.: 1.56 MIL/uL — ABNORMAL LOW (ref 4.22–5.81)
Retic Count, Absolute: 92.4 10*3/uL (ref 19.0–186.0)
Retic Ct Pct: 5.9 % — ABNORMAL HIGH (ref 0.4–3.1)

## 2023-07-10 MED ORDER — SODIUM CHLORIDE 0.9% IV SOLUTION: Freq: Once | INTRAVENOUS | Status: AC

## 2023-07-10 MED ORDER — HYDROXYUREA 500 MG PO CAPS
500.0000 mg | ORAL_CAPSULE | ORAL | Status: DC
  Administered 2023-07-11: 500 mg via ORAL
  Filled 2023-07-10: qty 1

## 2023-07-10 MED ORDER — CALCITRIOL 0.25 MCG PO CAPS
0.2500 ug | ORAL_CAPSULE | Freq: Every day | ORAL | Status: DC
  Administered 2023-07-11: 0.25 ug via ORAL
  Filled 2023-07-10: qty 1

## 2023-07-10 MED ORDER — DIPHENHYDRAMINE HCL 25 MG PO CAPS: 25.0000 mg | ORAL_CAPSULE | ORAL | Status: DC | PRN

## 2023-07-10 MED ORDER — OXYCODONE HCL 5 MG PO TABS
10.0000 mg | ORAL_TABLET | Freq: Four times a day (QID) | ORAL | Status: DC
  Administered 2023-07-10 – 2023-07-11 (×3): 10 mg via ORAL
  Filled 2023-07-10 (×3): qty 2

## 2023-07-10 MED ORDER — OXYCODONE HCL ER 15 MG PO T12A
15.0000 mg | EXTENDED_RELEASE_TABLET | Freq: Two times a day (BID) | ORAL | Status: DC
  Administered 2023-07-10 – 2023-07-11 (×2): 15 mg via ORAL
  Filled 2023-07-10 (×2): qty 1

## 2023-07-10 MED ORDER — SODIUM CHLORIDE 0.9% FLUSH
10.0000 mL | Freq: Two times a day (BID) | INTRAVENOUS | Status: DC
  Administered 2023-07-10: 10 mL

## 2023-07-10 MED ORDER — DEFERIPRONE (TWICE DAILY) 1000 MG PO TABS: 3000.0000 mg | ORAL_TABLET | Freq: Every day | ORAL | Status: DC

## 2023-07-10 MED ORDER — SODIUM CHLORIDE 0.9% FLUSH: 10.0000 mL | INTRAVENOUS | Status: DC | PRN

## 2023-07-10 MED ORDER — SODIUM CHLORIDE 0.9% FLUSH: 9.0000 mL | INTRAVENOUS | Status: DC | PRN

## 2023-07-10 MED ORDER — HYDROXYUREA 500 MG PO CAPS: 1500.0000 mg | ORAL_CAPSULE | ORAL | Status: DC

## 2023-07-10 MED ORDER — ONDANSETRON HCL 4 MG/2ML IJ SOLN: 4.0000 mg | Freq: Four times a day (QID) | INTRAMUSCULAR | Status: DC | PRN

## 2023-07-10 MED ORDER — HYDROMORPHONE 1 MG/ML IV SOLN
INTRAVENOUS | Status: DC
  Administered 2023-07-10: 0.5 mg via INTRAVENOUS
  Administered 2023-07-10: 30 mg via INTRAVENOUS
  Filled 2023-07-10: qty 30

## 2023-07-10 MED ORDER — HYDROXYUREA 500 MG PO CAPS: 500.0000 mg | ORAL_CAPSULE | ORAL | Status: DC

## 2023-07-10 MED ORDER — NALOXONE HCL 0.4 MG/ML IJ SOLN: 0.4000 mg | INTRAMUSCULAR | Status: DC | PRN

## 2023-07-10 MED ORDER — SODIUM CHLORIDE 0.9 % IV SOLN
12.5000 mg | Freq: Once | INTRAVENOUS | Status: AC
  Administered 2023-07-10: 12.5 mg via INTRAVENOUS
  Filled 2023-07-10: qty 12.5

## 2023-07-10 MED ORDER — HYDROMORPHONE HCL 1 MG/ML IJ SOLN
2.0000 mg | INTRAMUSCULAR | Status: AC
  Administered 2023-07-10: 2 mg via INTRAVENOUS
  Filled 2023-07-10: qty 2

## 2023-07-10 MED ORDER — HYDROMORPHONE HCL 1 MG/ML IJ SOLN: 1.0000 mg | Freq: Once | INTRAMUSCULAR | Status: DC

## 2023-07-10 MED ORDER — FOLIC ACID 1 MG PO TABS
1.0000 mg | ORAL_TABLET | Freq: Every day | ORAL | Status: DC
  Administered 2023-07-11: 1 mg via ORAL
  Filled 2023-07-10: qty 1

## 2023-07-10 MED ORDER — SODIUM CHLORIDE 0.45 % IV SOLN: INTRAVENOUS | Status: DC

## 2023-07-10 MED ORDER — AMITRIPTYLINE HCL 25 MG PO TABS
75.0000 mg | ORAL_TABLET | Freq: Every day | ORAL | Status: DC
  Administered 2023-07-10: 75 mg via ORAL
  Filled 2023-07-10: qty 3

## 2023-07-10 MED ORDER — SENNOSIDES-DOCUSATE SODIUM 8.6-50 MG PO TABS
1.0000 | ORAL_TABLET | Freq: Two times a day (BID) | ORAL | Status: DC
  Filled 2023-07-10: qty 1

## 2023-07-10 MED ORDER — POLYETHYLENE GLYCOL 3350 17 G PO PACK: 17.0000 g | PACK | Freq: Every day | ORAL | Status: DC | PRN

## 2023-07-10 MED ORDER — SODIUM CHLORIDE 0.9% IV SOLUTION: Freq: Once | INTRAVENOUS | Status: DC

## 2023-07-10 NOTE — Plan of Care (Signed)

## 2023-07-10 NOTE — Progress Notes (Signed)

## 2023-07-10 NOTE — Telephone Encounter (Signed)
 Pt called the day hospital wanting to come in today for sickle cell pain treatment. Pt reports 10/10 pain to both knees, feet, and legs. Pt states he took Oxycodone  10 mg and OxyContin  15 mg at 7 AM. Pt denies being to the ER recently. Pt denies fever, nausea, vomiting, diarrhea, and priapism. Pt endorses having some chest pain that is sometimes typical for crisis. Pt states that it hurts when he swallows due to chest pain. Pt states that he has some dizziness and tingling in body. Pt states that he fell yesterday. Marylu Soda, NP notified and per provider pt should report to the ER to be evaluated. Pt notified and verbalized understanding.

## 2023-07-10 NOTE — ED Triage Notes (Signed)
 Pt c/o chest and leg pain that started last night. Pt states he fell last night when he went to the bathroom.

## 2023-07-10 NOTE — H&P (Signed)
 H&P  Patient Demographics:  [name] [name], is a 43 y.o. male  MRN: [id number]   DOB - January 08, 1981  Admit Date - 07/10/2023  Outpatient Primary MD for the patient is [name], NP  Chief Complaint  Patient presents with   Sickle Cell Pain Crisis      HPI:   [name] [name]  is a 43 y.o. male,  with history of sickle cell disease. Chronic pain syndrome, opiate dependence and tolerance, anemia of chronic disease, and CKD stage IV who presented to the day emergency department with major complaints of generalized body pain but primarily in his lower extremities and back, rates pain  at 9/10. He describes this pain as constant and achy and throbbing. He took his home medications with no sustained relief. He denies any fever, headache, cough, blurry vision, chest pain, shortness of breath, nausea, vomiting or diarrhea. No urinary symptoms. He denies any recent travels, sick contacts   ED Course: Patient treated with IV fluids, IV pain medications with no resolution to pain. HGB below baseline, patient admitted for ongoing sickle cell pain management. BP 112/86  Pulse 97  Temp 98.1 F (36.7 C) (Oral)  Resp 14  Ht 6\' 3"  (1.905 m)  Wt 59 kg  SpO2 99%  BMI 16.25 kg/m   Labs Reviewed  COMPREHENSIVE METABOLIC PANEL WITH GFR - Abnormal; Notable for the following components:      Result Value   BUN 43 (*)    Creatinine, Ser 2.98 (*)    Calcium  8.3 (*)    Albumin  2.7 (*)    AST 98 (*)    ALT 52 (*)    Alkaline Phosphatase 216 (*)    GFR, Estimated 26 (*)    All other components within normal limits  CBC WITH DIFFERENTIAL/PLATELET - Abnormal; Notable for the following components:   WBC 11.8 (*)    RBC 1.53 (*)    Hemoglobin 6.1 (*)    HCT 18.8 (*)    MCV 122.9 (*)    MCH 39.9 (*)    nRBC 0.3 (*)    Lymphs Abs 5.6 (*)    All other components within normal limits  RETICULOCYTES - Abnormal; Notable for the following components:   Retic Ct Pct 5.9 (*)    RBC. 1.56 (*)     Immature Retic Fract 37.8 (*)    All other components within normal limits  RAPID URINE DRUG SCREEN, HOSP PERFORMED - Abnormal; Notable for the following components:   Opiates POSITIVE (*)    Cocaine  POSITIVE (*)    All other components within normal limits  LACTATE DEHYDROGENASE  TYPE AND SCREEN  PREPARE RBC (CROSSMATCH)  TROPONIN I (HIGH SENSITIVITY)  TROPONIN I (HIGH SENSITIVITY)      Review of systems:  In addition to the HPI above, patient reports No fever or chills No Headache, No changes with vision or hearing No problems swallowing food or liquids No chest pain, cough or shortness of breath No abdominal pain, No nausea or vomiting, Bowel movements are regular No blood in stool or urine No dysuria No new skin rashes or bruises No new weakness, tingling, numbness in any extremity No recent weight gain or loss No polyuria, polydypsia or polyphagia No significant Mental Stressors  A full 10 point Review of Systems was done, except as stated above, all other Review of Systems were negative.  With Past History of the following :   Past Medical History:  Diagnosis Date   Abscess of right  iliac fossa 09/24/2018   required Perc Drain 09/24/2018   Arachnoid Cyst of brain bilaterally    "2 really small ones in the back of my head; inside; saw them w/MRI" (09/25/2012)   Bacterial pneumonia ~ 2012   "caught it here in the hospital" (09/25/2012)   Chronic kidney disease    "from my sickle cell" (09/25/2012)   CKD (chronic kidney disease), stage II    Colitis 04/19/2017   CT scan abd/ pelvis   GERD (gastroesophageal reflux disease)    "after I eat alot of spicey foods" (09/25/2012)   Gynecomastia, male 07/10/2012   History of blood transfusion    "always related to sickle cell crisis" (09/25/2012)   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   Noted in noted from Hematology notes at [facility]   Migraines    "take RX qd to prevent them" (09/25/2012)   Opioid dependence  with withdrawal (HCC) 08/30/2012   Renal insufficiency    Sickle cell anemia (HCC)    Sickle cell crisis (HCC) 09/25/2012   Sickle cell nephropathy (HCC) 07/10/2012   Sinus tachycardia    Tachycardia with heart rate 121-140 beats per minute with ambulation 08/04/2016      Past Surgical History:  Procedure Laterality Date   ABCESS DRAINAGE     CHOLECYSTECTOMY  ~ 2012   COLONOSCOPY N/A 04/23/2017   Procedure: COLONOSCOPY;  Surgeon: Tobin Forts, MD;  Location: [facility];  Service: Endoscopy;  Laterality: N/A;   IR FLUORO GUIDE CV LINE RIGHT  12/17/2016   IR FLUORO GUIDE CV LINE RIGHT  02/07/2021   IR RADIOLOGIST EVAL & MGMT  10/02/2018   IR RADIOLOGIST EVAL & MGMT  10/15/2018   IR REMOVAL TUN CV CATH W/O FL  12/21/2016   IR REMOVAL TUN CV CATH W/O FL  02/11/2021   IR US  GUIDE VASC ACCESS RIGHT  12/17/2016   IR US  GUIDE VASC ACCESS RIGHT  02/07/2021   spleenectomy       Social History:   Social History   Tobacco Use   Smoking status: Former    Current packs/day: 0.10    Types: Cigarettes   Smokeless tobacco: Never  Substance Use Topics   Alcohol  use: Yes    Comment: Once a month     Lives - At home   Family History :   Family History  Problem Relation Age of Onset   Breast cancer Mother      Home Medications:   Prior to Admission medications   Medication Sig Start Date End Date Taking? Authorizing Provider  amitriptyline  (ELAVIL ) 25 MG tablet TAKE 2 TABLETS(50 MG) BY MOUTH AT BEDTIME Patient taking differently: Take 75 mg by mouth at bedtime. 05/24/23   [name], NP  calcitRIOL  (ROCALTROL ) 0.25 MCG capsule Take 0.25 mcg by mouth daily with breakfast. 11/16/20   [provider]  Deferiprone , Twice Daily, (FERRIPROX  TWICE-A-DAY) 1000 MG TABS Take 3,000 mg by mouth daily. 05/01/22   Sigurd Driver, FNP  folic acid  (FOLVITE ) 1 MG tablet TAKE 1 TABLET BY MOUTH EVERY DAY 03/22/20   Hollis, Lachina M, FNP  hydroxyurea  (HYDREA ) 500 MG capsule Take 1 capsule by  mouth in the morning on Sun/Tues/Wed/Thurs/Sat and 2 capsules on Mon/Fri Patient taking differently: Take 500-1,000 mg by mouth See admin instructions. Take 500 mg by mouth on Sun/Tues/Wed/Fri/Sat and 1,000 mg on Mon/Thurs 01/25/23   Nichols, [name], NP  oxyCODONE  (OXYCONTIN ) 15 mg 12 hr tablet Take 1 tablet (15 mg) by mouth  every 12 hours. 06/27/23   [name], NP  Oxycodone  HCl 10 MG TABS Take 1 tablet (10 mg total) by mouth See admin instructions. Take 10 mg by mouth every 6 hours 06/21/23   Nichols, [name], NP  sodium bicarbonate  650 MG tablet Take 1,950 mg by mouth with breakfast, with lunch, and with evening meal. 11/07/18   [provider]  sodium zirconium cyclosilicate  (LOKELMA ) 10 g PACK packet Take 20 g by mouth daily. 04/21/23 04/22/24  [provider]  TYLENOL  8 HOUR ARTHRITIS PAIN 650 MG CR tablet Take 650 mg by mouth every 8 (eight) hours as needed for pain.    [provider]     Allergies:   Allergies  Allergen Reactions   Nsaids Other (See Comments)    Kidney disease   Morphine  And Codeine  Other (See Comments)    "Really bad headaches"      Physical Exam:   Vitals:   Vitals:   07/10/23 1442 07/10/23 1514  BP:    Pulse:    Resp: 16 17  Temp:    SpO2:      Physical Exam: Constitutional: Patient appears well-developed and well-nourished. Not in obvious distress. HENT: Normocephalic, atraumatic, External right and left ear normal. Oropharynx is clear and moist.  Eyes: Conjunctivae and EOM are normal. PERRLA, no scleral icterus. Neck: Normal ROM. Neck supple. No JVD. No tracheal deviation. No thyromegaly. CVS: RRR, S1/S2 +, no murmurs, no gallops, no carotid bruit.  Pulmonary: Effort and breath sounds normal, no stridor, rhonchi, wheezes, rales.  Abdominal: Soft. BS +, no distension, tenderness, rebound or guarding.  Musculoskeletal: Generalize body pain, lower back pain Lymphadenopathy: No lymphadenopathy noted, cervical, inguinal or  axillary Neuro: Alert. Normal reflexes, muscle tone coordination. No cranial nerve deficit. Skin: Skin is warm and dry. No rash noted. Not diaphoretic. No erythema. No pallor. Psychiatric: Normal mood and affect. Behavior, judgment, thought content normal.   Data Review:   CBC Recent Labs  Lab 07/10/23 1011  WBC 11.8*  HGB 6.1*  HCT 18.8*  PLT 193  MCV 122.9*  MCH 39.9*  MCHC 32.4  RDW Not Measured  LYMPHSABS 5.6*  MONOABS 0.4  EOSABS 0.1  BASOSABS 0.0   ------------------------------------------------------------------------------------------------------------------  Chemistries  Recent Labs  Lab 07/10/23 1011  NA 137  K 4.0  CL 99  CO2 29  GLUCOSE 91  BUN 43*  CREATININE 2.98*  CALCIUM  8.3*  AST 98*  ALT 52*  ALKPHOS 216*  BILITOT 1.1   ------------------------------------------------------------------------------------------------------------------ estimated creatinine clearance is 27.5 mL/min (A) (by C-G formula based on SCr of 2.98 mg/dL (H)). ------------------------------------------------------------------------------------------------------------------ No results for input(s): "TSH", "T4TOTAL", "T3FREE", "THYROIDAB" in the last 72 hours.  Invalid input(s): "FREET3"  Coagulation profile No results for input(s): "INR", "PROTIME" in the last 168 hours. ------------------------------------------------------------------------------------------------------------------- No results for input(s): "DDIMER" in the last 72 hours. -------------------------------------------------------------------------------------------------------------------  Cardiac Enzymes No results for input(s): "CKMB", "TROPONINI", "MYOGLOBIN" in the last 168 hours.  Invalid input(s): "CK" ------------------------------------------------------------------------------------------------------------------ No results found for:  "BNP"  ---------------------------------------------------------------------------------------------------------------  Urinalysis    Component Value Date/Time   COLORURINE YELLOW 04/03/2022 1011   APPEARANCEUR CLEAR 04/03/2022 1011   LABSPEC 1.008 04/03/2022 1011   PHURINE 8.0 04/03/2022 1011   GLUCOSEU 50 (A) 04/03/2022 1011   HGBUR NEGATIVE 04/03/2022 1011   HGBUR small 03/10/2010 1445   BILIRUBINUR NEGATIVE 04/03/2022 1011   BILIRUBINUR negative 04/19/2021 1217   BILIRUBINUR neg 09/30/2020 1117   KETONESUR NEGATIVE 04/03/2022 1011   PROTEINUR NEGATIVE  04/03/2022 1011   UROBILINOGEN 0.2 04/19/2021 1217   UROBILINOGEN 0.2 05/11/2017 1335   NITRITE NEGATIVE 04/03/2022 1011   LEUKOCYTESUR NEGATIVE 04/03/2022 1011    ----------------------------------------------------------------------------------------------------------------   Imaging Results:    No results found.   Assessment & Plan:  Principal Problem:   Sickle cell anemia with pain (HCC) Active Problems:   TOBACCO ABUSE   CKD (chronic kidney disease), stage IV (HCC)   Leukocytosis   Chronic pain syndrome   Hb Sickle Cell Disease with crisis: Admit patient, start IVF 0.45% Saline @ KVO  mls/hour, start weight based Dilaudid  PCA discontinued due to patient UDS positive for cocaine , Restart oral home pain medications, Monitor vitals very closely, Re-evaluate pain scale regularly, 2 L of Oxygen  by [place], Patient will be re-evaluated for pain in the context of function and relationship to baseline as care progresses. Leukocytosis: slightly elevated no acute S/S of infection.  Anemia of Chronic Disease: HGB at 6.1 below patient baseline. I unit PRBC ordered for patient in the ED. Will continue to monitor. Daily CBC in place.  Chronic pain Syndrome: Continue oral home medication    DVT Prophylaxis: Subcut Lovenox    AM Labs Ordered, also please review Full Orders  Family Communication: Admission, patient's condition and  plan of care including tests being ordered have been discussed with the patient who indicate understanding and agree with the plan and Code Status.  Code Status: Full Code  Consults called: None    Admission status: Inpatient    Time spent in minutes : 50 minutes  Lorel Roes NP 07/10/2023 at 4:41 PM

## 2023-07-10 NOTE — Progress Notes (Signed)
 Patient admitted to 1617 for sickle cell crisis. Admission questions completed, patient said he had a fall 5/27 on the way to the bathroom and ED note supported that. Patient came in with a cane and unsteady gait. Patient advised on importance of bed alarm on, bed at lowest position, and call bell within reach. After bed alarm sounded various times, pt began to get agitated and refused staff members to come in and asked for bed alarm to be turned off. Patient notified about hospital policy and rationale behind bed alarm. Pt refused and was found messing with bed screen turning off the bed alarm.

## 2023-07-10 NOTE — ED Provider Notes (Signed)
 [place] EMERGENCY DEPARTMENT AT [facility] Provider Note   CSN: [id number] Arrival date & time: 07/10/23  4098     History  Chief Complaint  Patient presents with   Sickle Cell Pain Crisis    [name] [name]. is a 43 y.o. male.   Sickle Cell Pain Crisis Associated symptoms: chest pain    Patient is a 43 year old male presents ED today with leg pain and chest pain x 4 days, noting to be similar to previous sickle cell pain crisis.  Previous history of sickle cell anemia, CKD, GERD, cocaine  abuse. notes that he has been managing pain with a 15 mg oxycodones without relief.  Says that the chest pain is an ongoing discomfort when swallowing described as a "sharp shooting pain" saying it is similar to heartburn.  Has no pain on exertion.  Says he has not had cocaine  in the last week.  Denies fever, headache, vision changes, shortness of breath, abdominal pain, nausea, vomiting, dysuria, melena, hematochezia, diarrhea, lower leg swelling.    Home Medications Prior to Admission medications   Medication Sig Start Date End Date Taking? Authorizing Provider  amitriptyline  (ELAVIL ) 25 MG tablet TAKE 2 TABLETS(50 MG) BY MOUTH AT BEDTIME Patient taking differently: Take 75 mg by mouth at bedtime. 05/24/23   Nichols, [name], NP  calcitRIOL  (ROCALTROL ) 0.25 MCG capsule Take 0.25 mcg by mouth daily with breakfast. 11/16/20   [provider]  Deferiprone , Twice Daily, (FERRIPROX  TWICE-A-DAY) 1000 MG TABS Take 3,000 mg by mouth daily. 05/01/22   Sigurd Driver, FNP  folic acid  (FOLVITE ) 1 MG tablet TAKE 1 TABLET BY MOUTH EVERY DAY 03/22/20   Hollis, Lachina M, FNP  hydroxyurea  (HYDREA ) 500 MG capsule Take 1 capsule by mouth in the morning on Sun/Tues/Wed/Thurs/Sat and 2 capsules on Mon/Fri Patient taking differently: Take 500-1,000 mg by mouth See admin instructions. Take 500 mg by mouth on Sun/Tues/Wed/Fri/Sat and 1,000 mg on Mon/Thurs 01/25/23   Nichols, [name], NP   oxyCODONE  (OXYCONTIN ) 15 mg 12 hr tablet Take 1 tablet (15 mg) by mouth every 12 hours. 06/27/23   [name], NP  Oxycodone  HCl 10 MG TABS Take 1 tablet (10 mg total) by mouth See admin instructions. Take 10 mg by mouth every 6 hours 06/21/23   Nichols, [name], NP  sodium bicarbonate  650 MG tablet Take 1,950 mg by mouth with breakfast, with lunch, and with evening meal. 11/07/18   [provider]  sodium zirconium cyclosilicate  (LOKELMA ) 10 g PACK packet Take 20 g by mouth daily. 04/21/23 04/22/24  [provider]  TYLENOL  8 HOUR ARTHRITIS PAIN 650 MG CR tablet Take 650 mg by mouth every 8 (eight) hours as needed for pain.    [provider]      Allergies    Nsaids and Morphine  and codeine     Review of Systems   Review of Systems  Cardiovascular:  Positive for chest pain.  Musculoskeletal:  Positive for back pain.  All other systems reviewed and are negative.   Physical Exam Updated Vital Signs BP 112/86   Pulse 97   Temp 98.1 F (36.7 C) (Oral)   Resp 14   Ht 6\' 3"  (1.905 m)   Wt 59 kg   SpO2 99%   BMI 16.25 kg/m  Physical Exam Vitals and nursing note reviewed.  Constitutional:      General: He is not in acute distress.    Appearance: Normal appearance. He is not ill-appearing  or diaphoretic.  HENT:     Head: Normocephalic and atraumatic.  Eyes:     General: No scleral icterus.       Right eye: No discharge.        Left eye: No discharge.     Extraocular Movements: Extraocular movements intact.     Conjunctiva/sclera: Conjunctivae normal.  Cardiovascular:     Rate and Rhythm: Regular rhythm. Tachycardia present.     Pulses: Normal pulses.     Heart sounds: Normal heart sounds. No murmur heard.    No friction rub. No gallop.  Pulmonary:     Effort: Pulmonary effort is normal. No respiratory distress.     Breath sounds: Normal breath sounds. No stridor. No wheezing, rhonchi or rales.  Chest:     Chest wall: No tenderness.  Abdominal:      General: Abdomen is flat. There is no distension.     Palpations: Abdomen is soft.     Tenderness: There is no abdominal tenderness. There is no right CVA tenderness, left CVA tenderness or guarding.  Musculoskeletal:        General: Tenderness (Mild tenderness palpation to lower extremities across the thighs to palpation) present. No swelling, deformity or signs of injury.     Cervical back: Normal range of motion and neck supple. No rigidity or tenderness.     Right lower leg: No edema.     Left lower leg: No edema.  Skin:    General: Skin is warm and dry.     Findings: No bruising, erythema or lesion.  Neurological:     General: No focal deficit present.     Mental Status: He is alert and oriented to person, place, and time. Mental status is at baseline.     Sensory: No sensory deficit.     Motor: No weakness.     Gait: Gait normal.  Psychiatric:        Mood and Affect: Mood normal.     ED Results / Procedures / Treatments   Labs (all labs ordered are listed, but only abnormal results are displayed) Labs Reviewed  COMPREHENSIVE METABOLIC PANEL WITH GFR - Abnormal; Notable for the following components:      Result Value   BUN 43 (*)    Creatinine, Ser 2.98 (*)    Calcium  8.3 (*)    Albumin  2.7 (*)    AST 98 (*)    ALT 52 (*)    Alkaline Phosphatase 216 (*)    GFR, Estimated 26 (*)    All other components within normal limits  CBC WITH DIFFERENTIAL/PLATELET - Abnormal; Notable for the following components:   WBC 11.8 (*)    RBC 1.53 (*)    Hemoglobin 6.1 (*)    HCT 18.8 (*)    MCV 122.9 (*)    MCH 39.9 (*)    nRBC 0.3 (*)    Lymphs Abs 5.6 (*)    All other components within normal limits  RETICULOCYTES - Abnormal; Notable for the following components:   Retic Ct Pct 5.9 (*)    RBC. 1.56 (*)    Immature Retic Fract 37.8 (*)    All other components within normal limits  TYPE AND SCREEN  PREPARE RBC (CROSSMATCH)  TROPONIN I (HIGH SENSITIVITY)  TROPONIN I  (HIGH SENSITIVITY)    EKG None  Radiology No results found.  Procedures .Critical Care  Performed by: Hayes Lipps, PA-C Authorized by: Hayes Lipps, PA-C   Critical care provider  statement:    Critical care time (minutes):  42   Critical care time was exclusive of:  Separately billable procedures and treating other patients and teaching time   Critical care was necessary to treat or prevent imminent or life-threatening deterioration of the following conditions:  Metabolic crisis (Sickle cell anemia requiring packed red blood cell infusion)   Critical care was time spent personally by me on the following activities:  Development of treatment plan with patient or surrogate, discussions with consultants, evaluation of patient's response to treatment, examination of patient, obtaining history from patient or surrogate, ordering and performing treatments and interventions, ordering and review of laboratory studies, pulse oximetry, re-evaluation of patient's condition, ordering and review of radiographic studies and review of old charts   I assumed direction of critical care for this patient from another provider in my specialty: yes     Care discussed with: admitting provider       Medications Ordered in ED Medications  HYDROmorphone  (DILAUDID ) injection 2 mg (has no administration in time range)  0.9 %  sodium chloride  infusion (Manually program via Guardrails IV Fluids) (has no administration in time range)  diphenhydrAMINE  (BENADRYL ) 12.5 mg in sodium chloride  0.9 % 50 mL IVPB (0 mg Intravenous Stopped 07/10/23 1123)  HYDROmorphone  (DILAUDID ) injection 2 mg (2 mg Intravenous Given 07/10/23 1047)  HYDROmorphone  (DILAUDID ) injection 2 mg (2 mg Intravenous Given 07/10/23 1132)    ED Course/ Medical Decision Making/ A&P                                 Medical Decision Making Amount and/or Complexity of Data Reviewed Labs: ordered.  Risk Prescription drug management.   This  patient is a 43 year old male who presents to the ED for concern of sickle cell crisis, noting that his at home treatment was unable to manage his pain.  On physical exam, patient is in no acute distress, afebrile, alert and orient x 4, speaking in full sentences, nontachypneic, nontachycardic.  Resting comfortably without any signs of distress.  Notably tender over the anterior aspects of his thighs bilaterally.  No signs of erythema and patient is able to ambulate without difficulty and independently.  Exam is otherwise unremarkable.  Labs noted hemoglobin of 6.1, requiring blood transfusions with all labs assuming near baseline.  Patient was ordered type and screen and 2 units of packed red blood cells.  Patient is still resting comfortably but noting that his pain is still uncontrolled with 2 doses of Dilaudid , sickle cell team was called and discussed with Dr. Marylu Soda who stated that she will admit the patient.  Differential diagnoses prior to evaluation: The emergent differential diagnosis includes, but is not limited to, symptomatic anemia, vascular process, ACS. This is not an exhaustive differential.   Past Medical History / Co-morbidities / Social History: CKD, macrocytosis due to hydroxyurea , gynecomastia, Sickle cell anemia, migraines, cocaine  abuse, immune complex glomerulonephritis, GERD, fluid dependence, renal insufficiency, arachnoid cyst of brain  Additional history: Chart reviewed. Pertinent results include:   Last seen by us  in the cell clinic on 07/03/2023, no changes made to his care plan at that time.  Also seen by hematology clinic on 06/21/2023, noted to have been previously seen in the ED with elevated calcium  levels currently taking Lokelma .,  Noted that he normally experiences symptoms in legs.  Also taking hydroxyurea .  Lab Tests/Imaging studies: I personally interpreted labs/imaging and the pertinent results include:  CBC and CMP Hemoglobin of 6.1, decreased from  previous and elevated white count 11.8. CMP shows an elevated creatinine secondary, elevated alk phos of 267, elevated AST ALT also near similar to baseline. Reticulocyte count was noted to be 5.8, similar to previous admission   Cardiac monitoring: EKG obtained and interpreted by myself and attending physician which shows: Sinus tachycardia   Medications: I ordered medication including Dilaudid ,, diphenhydramine , packed red blood cells.  I have reviewed the patients home medicines and have made adjustments as needed.  Critical Interventions:  Social Determinants of Health:  Disposition: After consideration of the diagnostic results and the patients response to treatment, I feel that the patient would benefit from admission, patient care assumed by Dr. Marylu Soda with the sickle cell team who will admit the patient.   Final Clinical Impression(s) / ED Diagnoses Final diagnoses:  Sickle cell pain crisis [facility])    Rx / DC Orders ED Discharge Orders     None         Vevelyn Gowers 07/10/23 1238    Lind Repine, MD 07/12/23 0830

## 2023-07-10 NOTE — Telephone Encounter (Unsigned)
 Copied from CRM [id number]. Topic: Clinical - Medication Refill >> Jul 10, 2023  2:13 PM Carla L wrote: Medication:  Oxycodone  HCl 10 MG TABS   Has the patient contacted their pharmacy? Yes Told to contact office.   This is the patient's preferred pharmacy:  [facility] [address], [place] - [address] [place] [address] Phone: [phone] Fax: [phone]  Is this the correct pharmacy for this prescription? Yes  Has the prescription been filled recently? No  Is the patient out of the medication? Yes  Has the patient been seen for an appointment in the last year OR does the patient have an upcoming appointment? Yes  Can we respond through MyChart? Yes  Agent: Please be advised that Rx refills may take up to 3 business days. We ask that you follow-up with your pharmacy.

## 2023-07-11 DIAGNOSIS — D57219 Sickle-cell/Hb-C disease with crisis, unspecified: Secondary | ICD-10-CM | POA: Diagnosis not present

## 2023-07-11 LAB — CBC
HCT: 23.9 % — ABNORMAL LOW (ref 39.0–52.0)
Hemoglobin: 7.9 g/dL — ABNORMAL LOW (ref 13.0–17.0)
MCH: 35.4 pg — ABNORMAL HIGH (ref 26.0–34.0)
MCHC: 33.1 g/dL (ref 30.0–36.0)
MCV: 107.2 fL — ABNORMAL HIGH (ref 80.0–100.0)
Platelets: 176 10*3/uL (ref 150–400)
RBC: 2.23 MIL/uL — ABNORMAL LOW (ref 4.22–5.81)
WBC: 9.8 10*3/uL (ref 4.0–10.5)
nRBC: 0.2 % (ref 0.0–0.2)

## 2023-07-11 LAB — LACTATE DEHYDROGENASE: LDH: 140 U/L (ref 98–192)

## 2023-07-11 NOTE — Care Management Obs Status (Signed)
 MEDICARE OBSERVATION STATUS NOTIFICATION   Patient Details  Name: [name] [name]. MRN: [id number] Date of Birth: 03/20/1980   Medicare Observation Status Notification Given:  Yes    Loreda Rodriguez, RN 07/11/2023, 11:40 AM

## 2023-07-11 NOTE — Progress Notes (Signed)
   07/11/23 0927  TOC Brief Assessment  Insurance and Status Reviewed  Patient has primary care physician Yes  Home environment has been reviewed home alone  Prior level of function: independent  Prior/Current Home Services No current home services  Social Drivers of Health Review SDOH reviewed no interventions necessary  Readmission risk has been reviewed Yes  Transition of care needs no transition of care needs at this time

## 2023-07-11 NOTE — Telephone Encounter (Signed)
 Per pt chart, pt went to ED yesterday and was admitted to hospital for sickle cell crisis.

## 2023-07-11 NOTE — Plan of Care (Signed)

## 2023-07-11 NOTE — Discharge Summary (Signed)
 Physician Discharge Summary  [name] [name]. WUJ:[id number] DOB: 02-13-1981 DOA: 07/10/2023  PCP: [name], NP  Admit date: 07/10/2023  Discharge date: 07/11/2023  Discharge Diagnoses:  Principal Problem:   Sickle cell anemia with pain (HCC) Active Problems:   TOBACCO ABUSE   CKD (chronic kidney disease), stage IV (HCC)   Leukocytosis   Chronic pain syndrome   Discharge Condition: Stable  Disposition:  Pt is discharged home in good condition and is to follow up with [name], NP this week to have labs evaluated. [name] A Carne Jr. is instructed to increase activity slowly and balance with rest for the next few days, and use prescribed medication to complete treatment of pain  Diet: Regular Wt Readings from Last 3 Encounters:  07/10/23 59 kg  07/03/23 59.9 kg  06/03/23 55.2 kg    History of present illness:  [name] [name]  is a 43 y.o. male,  with history of sickle cell disease. Chronic pain syndrome, opiate dependence and tolerance, anemia of chronic disease, and CKD stage IV who presented to the day emergency department with major complaints of generalized body pain but primarily in his lower extremities and back, rates pain  at 9/10. He describes this pain as constant and achy and throbbing. He took his home medications with no sustained relief. He denies any fever, headache, cough, blurry vision, chest pain, shortness of breath, nausea, vomiting or diarrhea. No urinary symptoms. He denies any recent travels, sick contacts    ED Course: Patient treated with IV fluids, IV pain medications with no resolution to pain. HGB below baseline, patient admitted for ongoing sickle cell pain management. BP 112/86  Pulse 97  Temp 98.1 F (36.7 C) (Oral)  Resp 14  Ht 6\' 3"  (1.905 m)  Wt 59 kg  SpO2 99%  BMI 16.25 kg/m         Labs Reviewed  COMPREHENSIVE METABOLIC PANEL WITH GFR - Abnormal; Notable for the following components:      Result Value      BUN 43 (*)      Creatinine, Ser 2.98 (*)      Calcium  8.3 (*)      Albumin  2.7 (*)      AST 98 (*)      ALT 52 (*)      Alkaline Phosphatase 216 (*)      GFR, Estimated 26 (*)      All other components within normal limits  CBC WITH DIFFERENTIAL/PLATELET - Abnormal; Notable for the following components:    WBC 11.8 (*)      RBC 1.53 (*)      Hemoglobin 6.1 (*)      HCT 18.8 (*)      MCV 122.9 (*)      MCH 39.9 (*)      nRBC 0.3 (*)      Lymphs Abs 5.6 (*)      All other components within normal limits  RETICULOCYTES - Abnormal; Notable for the following components:    Retic Ct Pct 5.9 (*)      RBC. 1.56 (*)      Immature Retic Fract 37.8 (*)      All other components within normal limits  RAPID URINE DRUG SCREEN, HOSP PERFORMED - Abnormal; Notable for the following components:    Opiates POSITIVE (*)      Cocaine  POSITIVE (*)      All other components within normal limits  LACTATE DEHYDROGENASE  TYPE AND  SCREEN  PREPARE RBC (CROSSMATCH)  TROPONIN I (HIGH SENSITIVITY)  TROPONIN I (HIGH SENSITIVITY)     Hospital Course:  Patient was admitted for sickle cell pain crisis and managed appropriately with IVF, PRN oral pain medication as well as other adjunct therapies per sickle cell pain management protocols. Patient completed blood transfusion without complications, hgb improved to 7.9 g/dl from 6.1 on admission. Patient is tolerating PO without obstruction, ambulating with no assistance.  Patient was therefore discharged home today in a hemodynamically stable condition.   [place] will follow-up with PCP within 1 week of this discharge. [place]rington was counseled extensively about nonpharmacologic means of pain management, patient verbalized understanding and was appreciative of  the care received during this admission.   We discussed the need for good hydration, monitoring of hydration status, avoidance of heat, cold, stress, and infection triggers. We discussed the need to be  adherent with taking Hydrea  and other home medications. Patient was reminded of the need to seek medical attention immediately if any symptom of bleeding, anemia, or infection occurs.  Discharge Exam: Vitals:   07/11/23 0300 07/11/23 0950  BP: (!) 118/90 127/89  Pulse: 96 95  Resp: 18 20  Temp: 98.1 F (36.7 C) 98.3 F (36.8 C)  SpO2: 93% 97%   Vitals:   07/10/23 1848 07/10/23 2130 07/11/23 0300 07/11/23 0950  BP: 117/85 125/88 (!) 118/90 127/89  Pulse: 92 90 96 95  Resp: 15 16 18 20   Temp: 98.6 F (37 C) 98 F (36.7 C) 98.1 F (36.7 C) 98.3 F (36.8 C)  TempSrc: Oral Oral Oral Oral  SpO2:  100% 93% 97%  Weight:      Height:        General appearance : Awake, alert, not in any distress. Speech Clear. Not toxic looking HEENT: Atraumatic and Normocephalic, pupils equally reactive to light and accomodation Neck: Supple, no JVD. No cervical lymphadenopathy.  Chest: Good air entry bilaterally, no added sounds  CVS: S1 S2 regular, no murmurs.  Abdomen: Bowel sounds present, Non tender and not distended with no gaurding, rigidity or rebound. Extremities: B/L Lower Ext shows no edema, both legs are warm to touch Neurology: Awake alert, and oriented X 3, CN II-XII intact, Non focal Skin: No Rash  Discharge Instructions  Discharge Instructions     Call MD for:  severe uncontrolled pain   Complete by: As directed    Call MD for:  temperature >100.4   Complete by: As directed    Diet - low sodium heart healthy   Complete by: As directed    Increase activity slowly   Complete by: As directed       Allergies as of 07/11/2023       Reactions   Nsaids Other (See Comments)   Kidney disease   Morphine  And Codeine  Other (See Comments)   "Really bad headaches"         Medication List     TAKE these medications    amitriptyline  25 MG tablet Commonly known as: ELAVIL  TAKE 2 TABLETS(50 MG) BY MOUTH AT BEDTIME What changed: See the new instructions.   calcitRIOL  0.25  MCG capsule Commonly known as: ROCALTROL  Take 4 capsules by mouth daily with breakfast.   Ferriprox  Twice-A-Day 1000 MG Tabs Generic drug: Deferiprone  (Twice Daily) Take 4,000 mg by mouth daily.   folic acid  1 MG tablet Commonly known as: FOLVITE  TAKE 1 TABLET BY MOUTH EVERY DAY   hydroxyurea  500 MG capsule Commonly known as: HYDREA  Take  1 capsule by mouth in the morning on Sun/Tues/Wed/Thurs/Sat and 2 capsules on Mon/Fri What changed: additional instructions   Oxycodone  HCl 10 MG Tabs Take 1 tablet (10 mg total) by mouth See admin instructions. Take 10 mg by mouth every 6 hours What changed:  when to take this additional instructions   oxyCODONE  15 mg 12 hr tablet Commonly known as: OXYCONTIN  Take 1 tablet (15 mg) by mouth every 12 hours. What changed: Another medication with the same name was changed. Make sure you understand how and when to take each.   sodium bicarbonate  650 MG tablet Take 650 mg by mouth with breakfast, with lunch, and with evening meal.   sodium zirconium cyclosilicate  10 g Pack packet Commonly known as: LOKELMA  Take 10 g by mouth daily.   Tylenol  8 Hour Arthritis Pain 650 MG CR tablet Generic drug: acetaminophen  Take 1,300 mg by mouth as needed for pain.        The results of significant diagnostics from this hospitalization (including imaging, microbiology, ancillary and laboratory) are listed below for reference.    Significant Diagnostic Studies: No results found.  Microbiology: No results found for this or any previous visit (from the past 240 hours).   Labs: Basic Metabolic Panel: Recent Labs  Lab 07/10/23 1011  NA 137  K 4.0  CL 99  CO2 29  GLUCOSE 91  BUN 43*  CREATININE 2.98*  CALCIUM  8.3*   Liver Function Tests: Recent Labs  Lab 07/10/23 1011  AST 98*  ALT 52*  ALKPHOS 216*  BILITOT 1.1  PROT 6.5  ALBUMIN  2.7*   No results for input(s): "LIPASE", "AMYLASE" in the last 168 hours. No results for input(s):  "AMMONIA" in the last 168 hours. CBC: Recent Labs  Lab 07/10/23 1011 07/11/23 0602  WBC 11.8* 9.8  NEUTROABS 5.6  --   HGB 6.1* 7.9*  HCT 18.8* 23.9*  MCV 122.9* 107.2*  PLT 193 176   Cardiac Enzymes: No results for input(s): "CKTOTAL", "CKMB", "CKMBINDEX", "TROPONINI" in the last 168 hours. BNP: Invalid input(s): "POCBNP" CBG: No results for input(s): "GLUCAP" in the last 168 hours.  Time coordinating discharge: 50 minutes  Signed:  Lorel Roes NP    07/11/2023, 3:47 PM

## 2023-07-12 ENCOUNTER — Telehealth: Payer: Self-pay

## 2023-07-12 NOTE — Transitions of Care (Post Inpatient/ED Visit) (Signed)
 07/12/2023  Name: [name] [name]. MRN: [id number] DOB: 12/27/1980  Today's TOC FU Call Status: Today's TOC FU Call Status:: Successful TOC FU Call Completed TOC FU Call Complete Date: 07/12/23 Patient's Name and Date of Birth confirmed.  Transition Care Management Follow-up Telephone Call Date of Discharge: 07/11/23 Discharge Facility: [facility]) Type of Discharge: Inpatient Admission Primary Inpatient Discharge Diagnosis:: HbSS How have you been since you were released from the hospital?: Better Any questions or concerns?: No  Items Reviewed: Did you receive and understand the discharge instructions provided?: Yes Medications obtained,verified, and reconciled?: Yes (Medications Reviewed) Any new allergies since your discharge?: No Dietary orders reviewed?: Yes Do you have support at home?: Yes People in Home [RPT]: parent(s)  Medications Reviewed Today: Medications Reviewed Today     Reviewed by Darrall Ellison, LPN (Licensed Practical Nurse) on 07/12/23 at [phone]  Med List Status: <None>   Medication Order Taking? Sig Documenting Provider Last Dose Status Informant  amitriptyline  (ELAVIL ) 25 MG tablet [id number] No TAKE 2 TABLETS(50 MG) BY MOUTH AT BEDTIME  Patient taking differently: Take 50-125 mg by mouth at bedtime.   [name], NP 07/09/2023 Active Self, Pharmacy Records           Med Note (CRUTHIS, CHLOE C   Tue Jul 10, 2023  1:27 PM) Pt stated he takes between 50 mg to 125 mg a night to help him sleep.   calcitRIOL  (ROCALTROL ) 0.25 MCG capsule [id number] No Take 4 capsules by mouth daily with breakfast. [provider] 07/09/2023 Active Self, Pharmacy Records           Med Note (CRUTHIS, CHLOE C   Tue Jul 10, 2023  1:27 PM) Pt stated he is taking 4 capsules of this medication daily.   Deferiprone , Twice Daily, (FERRIPROX  TWICE-A-DAY) 1000 MG TABS [id number] No Take 4,000 mg by mouth daily. Sigurd Driver, FNP 07/09/2023 Active Self, Pharmacy  Records           Med Note (CRUTHIS, CHLOE C   Tue Jul 10, 2023  1:28 PM) Pt stated he is taking 4000 mg of this medication daily.   folic acid  (FOLVITE ) 1 MG tablet [id number] No TAKE 1 TABLET BY MOUTH EVERY DAY Sigurd Driver, FNP 07/09/2023 Active Self, Pharmacy Records           Med Note (COFFELL, Stan Eans   Thu Jun 23, 2021 11:44 AM)    hydroxyurea  (HYDREA ) 500 MG capsule [id number] No Take 1 capsule by mouth in the morning on Sun/Tues/Wed/Thurs/Sat and 2 capsules on Mon/Fri  Patient taking differently: Take 500-1,000 mg by mouth See admin instructions. Take 1500 mg by mouth on Tuesday and Thursday. Take 500 mg by mouth on Sunday, Monday, Wednesday, Friday, and Saturday   [name], [place] 07/09/2023 Active Self, Pharmacy Records  oxyCODONE  (OXYCONTIN ) 15 mg 12 hr tablet [id number] No Take 1 tablet (15 mg) by mouth every 12 hours. [name], NP 07/09/2023 Active Self, Pharmacy Records  Oxycodone  HCl 10 MG TABS [id number] No Take 1 tablet (10 mg total) by mouth See admin instructions. Take 10 mg by mouth every 6 hours  Patient taking differently: Take 10 mg by mouth every 6 (six) hours.   [name], NP 07/10/2023 Active Self, Pharmacy Records  sodium bicarbonate  650 MG tablet [id number] No Take 650 mg by mouth with breakfast, with lunch, and with evening meal. [provider] 07/09/2023 Active Self, Pharmacy Records  Med Note Mliss Anderson ZOXWRUEAV, SUSAN A   Fri Dec 29, 2022  8:40 PM)    sodium zirconium cyclosilicate  (LOKELMA ) 10 g PACK packet [id number] No Take 10 g by mouth daily. [provider] 07/09/2023 Active Self, Pharmacy Records  TYLENOL  8 HOUR ARTHRITIS PAIN 650 MG CR tablet [id number] No Take 1,300 mg by mouth as needed for pain. [provider] 07/10/2023 Active Self, Pharmacy Records           Med Note Roslynn Coombes, [facility]   Mon Apr 03, 2022  1:32 PM)              Home Care and Equipment/Supplies: Were Home Health Services Ordered?:  NA Any new equipment or medical supplies ordered?: NA  Functional Questionnaire: Do you need assistance with bathing/showering or dressing?: No Do you need assistance with meal preparation?: No Do you need assistance with eating?: No Do you have difficulty maintaining continence: No Do you need assistance with getting out of bed/getting out of a chair/moving?: No Do you have difficulty managing or taking your medications?: No  Follow up appointments reviewed: PCP Follow-up appointment confirmed?: Yes Date of PCP follow-up appointment?: 07/23/23 Follow-up Provider: [facility] Follow-up appointment confirmed?: NA Do you need transportation to your follow-up appointment?: No Do you understand care options if your condition(s) worsen?: Yes-patient verbalized understanding    SIGNATURE Darrall Ellison, LPN [facility] Nurse Health Advisor Direct Dial [phone]

## 2023-07-13 ENCOUNTER — Other Ambulatory Visit: Payer: Self-pay

## 2023-07-13 DIAGNOSIS — D571 Sickle-cell disease without crisis: Secondary | ICD-10-CM

## 2023-07-14 LAB — TYPE AND SCREEN
ABO/RH(D): O POS
Antibody Screen: NEGATIVE
Donor AG Type: NEGATIVE
Donor AG Type: NEGATIVE
Donor AG Type: NEGATIVE
Unit division: 0
Unit division: 0
Unit division: 0

## 2023-07-14 LAB — BPAM RBC
Blood Product Expiration Date: 202506192359
Blood Product Expiration Date: 202506242359
Blood Product Expiration Date: 202507032359
ISSUE DATE / TIME: 202505271800
ISSUE DATE / TIME: 202505271828
Unit Type and Rh: 5100
Unit Type and Rh: 5100
Unit Type and Rh: 5100

## 2023-07-16 ENCOUNTER — Other Ambulatory Visit: Payer: Self-pay | Admitting: Internal Medicine

## 2023-07-16 DIAGNOSIS — G894 Chronic pain syndrome: Secondary | ICD-10-CM

## 2023-07-16 DIAGNOSIS — D571 Sickle-cell disease without crisis: Secondary | ICD-10-CM

## 2023-07-16 MED ORDER — OXYCODONE HCL 10 MG PO TABS
10.0000 mg | ORAL_TABLET | ORAL | 0 refills | Status: DC
Start: 2023-07-16 — End: 2023-08-16

## 2023-07-18 ENCOUNTER — Telehealth (HOSPITAL_COMMUNITY): Payer: Self-pay

## 2023-07-18 NOTE — Telephone Encounter (Signed)
 Patient called in. Complains of pain in lower back and tailbone rates 8/10. Pt states he slipped in the shower this morning, denies dizziness or LOC.  Denied chest pain, abd pain, fever, N/V/D. Wants to come in for treatment. Last took Oxycodone  this morning, stating that it is not helping with pain. Pt states his mother is his transportation today. [name] notified, pt not approved to be seen in the day hospital today, instruct pt to go to an Urgent Care or ED for treatment and imaging. Pt notified , verbalized understanding.

## 2023-07-19 ENCOUNTER — Emergency Department (HOSPITAL_COMMUNITY)
Admission: EM | Admit: 2023-07-19 | Discharge: 2023-07-19 | Disposition: A | Discharge status: Home / Self Care | Attending: Emergency Medicine | Admitting: Emergency Medicine

## 2023-07-19 ENCOUNTER — Emergency Department (HOSPITAL_COMMUNITY)

## 2023-07-19 ENCOUNTER — Encounter (HOSPITAL_COMMUNITY): Payer: Self-pay | Admitting: *Deleted

## 2023-07-19 ENCOUNTER — Other Ambulatory Visit: Payer: Self-pay

## 2023-07-19 DIAGNOSIS — M25512 Pain in left shoulder: Secondary | ICD-10-CM | POA: Insufficient documentation

## 2023-07-19 DIAGNOSIS — N189 Chronic kidney disease, unspecified: Secondary | ICD-10-CM | POA: Diagnosis not present

## 2023-07-19 DIAGNOSIS — W182XXA Fall in (into) shower or empty bathtub, initial encounter: Secondary | ICD-10-CM | POA: Insufficient documentation

## 2023-07-19 DIAGNOSIS — D57219 Sickle-cell/Hb-C disease with crisis, unspecified: Secondary | ICD-10-CM | POA: Insufficient documentation

## 2023-07-19 DIAGNOSIS — Y92002 Bathroom of unspecified non-institutional (private) residence single-family (private) house as the place of occurrence of the external cause: Secondary | ICD-10-CM | POA: Insufficient documentation

## 2023-07-19 DIAGNOSIS — G8911 Acute pain due to trauma: Secondary | ICD-10-CM | POA: Insufficient documentation

## 2023-07-19 DIAGNOSIS — Y93E1 Activity, personal bathing and showering: Secondary | ICD-10-CM | POA: Diagnosis not present

## 2023-07-19 DIAGNOSIS — W19XXXA Unspecified fall, initial encounter: Secondary | ICD-10-CM

## 2023-07-19 DIAGNOSIS — D5700 Hb-SS disease with crisis, unspecified: Secondary | ICD-10-CM

## 2023-07-19 DIAGNOSIS — M545 Low back pain, unspecified: Secondary | ICD-10-CM | POA: Diagnosis present

## 2023-07-19 LAB — COMPREHENSIVE METABOLIC PANEL WITH GFR
ALT: 35 U/L (ref 0–44)
AST: 40 U/L (ref 15–41)
Albumin: 3.1 g/dL — ABNORMAL LOW (ref 3.5–5.0)
Alkaline Phosphatase: 213 U/L — ABNORMAL HIGH (ref 38–126)
Anion gap: 10 (ref 5–15)
BUN: 56 mg/dL — ABNORMAL HIGH (ref 6–20)
CO2: 19 mmol/L — ABNORMAL LOW (ref 22–32)
Calcium: 8.5 mg/dL — ABNORMAL LOW (ref 8.9–10.3)
Chloride: 104 mmol/L (ref 98–111)
Creatinine, Ser: 3.64 mg/dL — ABNORMAL HIGH (ref 0.61–1.24)
GFR, Estimated: 21 mL/min — ABNORMAL LOW (ref >60)
Glucose, Bld: 103 mg/dL — ABNORMAL HIGH (ref 70–99)
Potassium: 4.5 mmol/L (ref 3.5–5.1)
Sodium: 133 mmol/L — ABNORMAL LOW (ref 135–145)
Total Bilirubin: 1.2 mg/dL (ref 0.0–1.2)
Total Protein: 7.3 g/dL (ref 6.5–8.1)

## 2023-07-19 LAB — RETICULOCYTES
Immature Retic Fract: 9.3 % (ref 2.3–15.9)
RBC.: 2.03 MIL/uL — ABNORMAL LOW (ref 4.22–5.81)
Retic Count, Absolute: 45.5 10*3/uL (ref 19.0–186.0)
Retic Ct Pct: 2.2 % (ref 0.4–3.1)

## 2023-07-19 LAB — CBC WITH DIFFERENTIAL/PLATELET
Abs Immature Granulocytes: 0.03 10*3/uL (ref 0.00–0.07)
Basophils Absolute: 0 10*3/uL (ref 0.0–0.1)
Basophils Relative: 0 %
Eosinophils Absolute: 0.1 10*3/uL (ref 0.0–0.5)
Eosinophils Relative: 1 %
HCT: 21.6 % — ABNORMAL LOW (ref 39.0–52.0)
Hemoglobin: 7.2 g/dL — ABNORMAL LOW (ref 13.0–17.0)
Immature Granulocytes: 0 %
Lymphocytes Relative: 43 %
Lymphs Abs: 3.6 10*3/uL (ref 0.7–4.0)
MCH: 35.1 pg — ABNORMAL HIGH (ref 26.0–34.0)
MCHC: 33.3 g/dL (ref 30.0–36.0)
MCV: 105.4 fL — ABNORMAL HIGH (ref 80.0–100.0)
Monocytes Absolute: 0.3 10*3/uL (ref 0.1–1.0)
Monocytes Relative: 3 %
Neutro Abs: 4.5 10*3/uL (ref 1.7–7.7)
Neutrophils Relative %: 53 %
Platelets: 184 10*3/uL (ref 150–400)
RBC: 2.05 MIL/uL — ABNORMAL LOW (ref 4.22–5.81)
WBC: 8.4 10*3/uL (ref 4.0–10.5)
nRBC: 0.2 % (ref 0.0–0.2)

## 2023-07-19 MED ORDER — HYDROMORPHONE HCL 1 MG/ML IJ SOLN
2.0000 mg | INTRAMUSCULAR | Status: AC
  Administered 2023-07-19: 2 mg via INTRAVENOUS
  Filled 2023-07-19: qty 2

## 2023-07-19 MED ORDER — SODIUM CHLORIDE 0.45 % IV SOLN: INTRAVENOUS | Status: DC

## 2023-07-19 MED ORDER — ONDANSETRON HCL 4 MG/2ML IJ SOLN: 4.0000 mg | INTRAMUSCULAR | Status: DC | PRN

## 2023-07-19 MED ORDER — HYDROMORPHONE HCL 1 MG/ML IJ SOLN: 2.0000 mg | INTRAMUSCULAR | Status: AC

## 2023-07-19 MED ORDER — DIPHENHYDRAMINE HCL 25 MG PO CAPS
25.0000 mg | ORAL_CAPSULE | ORAL | Status: DC | PRN
  Administered 2023-07-19: 50 mg via ORAL
  Filled 2023-07-19: qty 1

## 2023-07-19 NOTE — ED Provider Notes (Signed)
 [place] EMERGENCY DEPARTMENT AT [facility] Provider Note   CSN: [id number] Arrival date & time: 07/19/23  7829     History  Chief Complaint  Patient presents with   Fall   Sickle Cell Pain Crisis    [name] [name]. is a 43 y.o. male.  [name] [name]. is a 43 y.o. male with a history of sickle cell anemia, who presents for evaluation of pain in his low back and left shoulder.  He reports that he slipped getting out of the shower yesterday and fell backwards landing on his bottom and striking his left shoulder on the side of the shower.  He reports he feels like this has triggered her sickle cell pain crisis and now he is having severe pain in those 2 areas but also radiating throughout his low back.  He denies any numbness, weakness or tingling.  Did not hit his head or lose consciousness.  Denies any chest pain, shortness of breath, cough or fever.  No abdominal pain.  He called the sickle cell patient treatment center but they encouraged him to come in for evaluation given his recent fall.  He took his home pain medication this morning without relief.  He was recently admitted to the hospital and required transfusion.  The history is provided by the patient and medical records.  Fall Pertinent negatives include no chest pain and no shortness of breath.  Sickle Cell Pain Crisis Associated symptoms: no chest pain, no cough, no fever, no nausea, no shortness of breath and no vomiting        Home Medications Prior to Admission medications   Medication Sig Start Date End Date Taking? Authorizing Provider  amitriptyline  (ELAVIL ) 25 MG tablet TAKE 2 TABLETS(50 MG) BY MOUTH AT BEDTIME Patient taking differently: Take 50-125 mg by mouth at bedtime. 05/24/23   [name], NP  calcitRIOL  (ROCALTROL ) 0.25 MCG capsule Take 4 capsules by mouth daily with breakfast. 11/16/20   [provider]  Deferiprone , Twice Daily, (FERRIPROX  TWICE-A-DAY) 1000 MG TABS  Take 4,000 mg by mouth daily. 05/01/22   Sigurd Driver, FNP  folic acid  (FOLVITE ) 1 MG tablet TAKE 1 TABLET BY MOUTH EVERY DAY 03/22/20   Hollis, Lachina M, FNP  hydroxyurea  (HYDREA ) 500 MG capsule Take 1 capsule by mouth in the morning on Sun/Tues/Wed/Thurs/Sat and 2 capsules on Mon/Fri Patient taking differently: Take 500-1,000 mg by mouth See admin instructions. Take 1500 mg by mouth on Tuesday and Thursday. Take 500 mg by mouth on Sunday, Monday, Wednesday, Friday, and Saturday 01/25/23   Nichols, [name], NP  oxyCODONE  (OXYCONTIN ) 15 mg 12 hr tablet Take 1 tablet (15 mg) by mouth every 12 hours. 06/27/23   [name], NP  Oxycodone  HCl 10 MG TABS Take 1 tablet (10 mg total) by mouth See admin instructions. Take 10 mg by mouth every 6 hours 07/16/23   [name], [name], MD  sodium bicarbonate  650 MG tablet Take 650 mg by mouth with breakfast, with lunch, and with evening meal. 11/07/18   [provider]  sodium zirconium cyclosilicate  (LOKELMA ) 10 g PACK packet Take 10 g by mouth daily. 04/21/23 04/22/24  [provider]  TYLENOL  8 HOUR ARTHRITIS PAIN 650 MG CR tablet Take 1,300 mg by mouth as needed for pain.    [provider]      Allergies    Nsaids and Morphine  and codeine     Review of Systems   Review of  Systems  Constitutional:  Negative for chills and fever.  Respiratory:  Negative for cough and shortness of breath.   Cardiovascular:  Negative for chest pain.  Gastrointestinal:  Negative for nausea and vomiting.  Musculoskeletal:  Positive for arthralgias and back pain.    Physical Exam Updated Vital Signs BP 121/81 (BP Location: Right Arm)   Pulse (!) 114   Temp 99.4 F (37.4 C)   Resp (!) 22   SpO2 97%  Physical Exam Vitals and nursing note reviewed.  Constitutional:      General: He is not in acute distress.    Appearance: Normal appearance. He is well-developed. He is not diaphoretic.     Comments: Alert, well-appearing and in no  distress  HENT:     Head: Normocephalic and atraumatic.  Eyes:     General:        Right eye: No discharge.        Left eye: No discharge.     Pupils: Pupils are equal, round, and reactive to light.  Cardiovascular:     Rate and Rhythm: Normal rate and regular rhythm.     Pulses: Normal pulses.     Heart sounds: Normal heart sounds.  Pulmonary:     Effort: Pulmonary effort is normal. No respiratory distress.     Breath sounds: Normal breath sounds. No wheezing or rales.     Comments: Respirations equal and unlabored, patient able to speak in full sentences, lungs clear to auscultation bilaterally  Abdominal:     General: Bowel sounds are normal. There is no distension.     Palpations: Abdomen is soft. There is no mass.     Tenderness: There is no abdominal tenderness. There is no guarding.     Comments: Abdomen soft, nondistended, nontender to palpation in all quadrants without guarding or peritoneal signs  Musculoskeletal:        General: No deformity.     Cervical back: Neck supple.     Comments: There is mild tenderness over the anterior lateral left shoulder without palpable deformity or swelling, no overlying skin changes, there is also midline lower lumbar spine tenderness without palpable step-off or deformity.  Skin:    General: Skin is warm and dry.     Capillary Refill: Capillary refill takes less than 2 seconds.  Neurological:     Mental Status: He is alert and oriented to person, place, and time.     Coordination: Coordination normal.     Comments: Speech is clear, able to follow commands CN III-XII intact Normal strength in upper and lower extremities bilaterally including dorsiflexion and plantar flexion, strong and equal grip strength Sensation normal to light and sharp touch Moves extremities without ataxia, coordination intact  Psychiatric:        Mood and Affect: Mood normal.        Behavior: Behavior normal.     ED Results / Procedures / Treatments    Labs (all labs ordered are listed, but only abnormal results are displayed) Labs Reviewed  CBC WITH DIFFERENTIAL/PLATELET - Abnormal; Notable for the following components:      Result Value   RBC 2.05 (*)    Hemoglobin 7.2 (*)    HCT 21.6 (*)    MCV 105.4 (*)    MCH 35.1 (*)    All other components within normal limits  COMPREHENSIVE METABOLIC PANEL WITH GFR - Abnormal; Notable for the following components:   Sodium 133 (*)    CO2 19 (*)  Glucose, Bld 103 (*)    BUN 56 (*)    Creatinine, Ser 3.64 (*)    Calcium  8.5 (*)    Albumin  3.1 (*)    Alkaline Phosphatase 213 (*)    GFR, Estimated 21 (*)    All other components within normal limits  RETICULOCYTES - Abnormal; Notable for the following components:   RBC. 2.03 (*)    All other components within normal limits    EKG EKG Interpretation Date/Time:  Thursday July 19 2023 06:59:17 EDT Ventricular Rate:  104 PR Interval:  186 QRS Duration:  88 QT Interval:  318 QTC Calculation: 419 R Axis:   65  Text Interpretation: Sinus tachycardia Consider left atrial enlargement When compared with ECG of 07/10/2023, No significant change was found Confirmed by Alissa April ([id number]) on 07/19/2023 7:01:03 AM  Radiology DG Chest 2 View Result Date: 07/19/2023 CLINICAL DATA:  Sickle cell disease.  Status post fall. EXAM: CHEST - 2 VIEW COMPARISON:  06/03/2023 FINDINGS: The heart size is normal. There is no pleural fluid or interstitial edema. No airspace consolidation or pneumothorax. Chronic coarsened interstitial markings with lower lung zone scarring noted. Signs chronic calcified spleen in the left upper quadrant of the abdomen. Right upper quadrant cholecystectomy clips. Bony stigmata of sickle cell disease. Bilateral humeral head AVN, more advanced on the right. IMPRESSION: 1. No acute cardiopulmonary abnormalities. 2. Chronic coarsened interstitial markings with lower lung zone scarring. 3. Bony stigmata of sickle cell disease.  Electronically Signed   By: Kimberley Penman M.D.   On: 07/19/2023 07:32   CT Lumbar Spine Wo Contrast Result Date: 07/19/2023 EXAM: CT OF THE LUMBAR SPINE WITHOUT CONTRAST 07/19/2023 07:19:00 AM TECHNIQUE: CT of the lumbar spine was performed without the administration of intravenous contrast. Multiplanar reformatted images are provided for review. Automated exposure control, iterative reconstruction, and/or weight based adjustment of the mA/kV was utilized to reduce the radiation dose to as low as reasonably achievable. COMPARISON: None available. CLINICAL HISTORY: Back trauma, no prior imaging (Age >= 16y). Fell in the shower (slipped and fell) yesterday, hit the lower back, and the left shoulder. No LOC. Dizziness. Personal history of sickle cell disease. FINDINGS: BONES AND ALIGNMENT: 5 non-weightbearing lumbar-type vertebral bodies are present. Straightening of the normal lumbar lordosis is present. Leftward curvature is present in the lower lumbar spine. DEGENERATIVE CHANGES: Prominent schmorl's nodes are present at L3-4 and along the superior endplate of L2 and L3. Schmorl's nodes are present at T11-12 and along the inferior endplate of T12. Mild disc bulging is present at L3-4, L4-5, and L5-S1 without focal stenosis. SOFT TISSUES: No paraspinal hematoma. LIMITED RETROPERITONEUM: Limited images of the retroperitoneum demonstrate no acute abnormality. Additional findings: Diffuse osteopenia and endplate changes are consistent with sickle cell disease. No acute fractures are present. IMPRESSION: 1. No acute fractures. 2. Mild disc bulging at L3-4, L4-5, and L5-S1 without focal stenosis. 3. Diffuse osteopenia and endplate changes consistent with sickle cell disease. Electronically signed by: Audree Leas MD 07/19/2023 07:31 AM EDT RP Workstation: [id number]   DG Shoulder Left Result Date: 07/19/2023 CLINICAL DATA:  Fall.  Sickle cell pain. EXAM: LEFT SHOULDER - 2+ VIEW COMPARISON:  None Available.  FINDINGS: No signs of acute fracture or dislocation. Bony stigmata of sickle cell disease identified. Subchondral sclerosis along the left humeral head is noted compatible with early AVN. Chronic coarsened interstitial markings are noted within the imaged portions of the lungs, also compatible with sickle cell disease. IMPRESSION: 1. No acute findings. 2.  Bony stigmata of sickle cell disease. 3. 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 Electronically Signed   By: Kimberley Penman M.D.   On: 07/19/2023 07:30     Procedures Procedures    Medications Ordered in ED Medications  HYDROmorphone  (DILAUDID ) injection 2 mg (has no administration in time range)  HYDROmorphone  (DILAUDID ) injection 2 mg (has no administration in time range)  HYDROmorphone  (DILAUDID ) injection 2 mg (2 mg Intravenous Given 07/19/23 8295)    ED Course/ Medical Decision Making/ A&P                                 Medical Decision Making Amount and/or Complexity of Data Reviewed Labs: ordered. Radiology: ordered.  Risk Prescription drug management.   43 year old male with sickle cell anemia presents after a fall with pain in his low back and left shoulder, he is concerned this may have triggered a sickle cell pain crisis.  Will obtain labs as well as imaging of the areas injured during the fall.  On arrival patient is mildly tachycardic but afebrile, tachycardia resolved with treatment of pain and remainder patient's vitals have remained stable.  No obvious deformity from fall on exam.  Labs show improvement in hemoglobin after recent admission and transfusion, no leukocytosis, no other significant laboratory derangements, patient does have some chronic kidney disease, creatinine is slightly increased from baseline today at 3.6, IV fluids given and will have patient follow-up closely for recheck of creatinine.  Imaging including chest x-ray, shoulder x-ray and CT of the lumbar spine obtained and viewed and interpreted  independently without evidence of fracture or deformity or any signs of consolidation or acute chest syndrome, patient does have some lumbar disc bulging without focal compression and he is neurologically intact, this could be chronic versus related to the fall will have him fracture noted.  After 1 dose of pain medication patient was able to sleep and was more comfortable on reevaluation patient still dozing intermittently and reports improvement in pain, will hold off on any additional pain medications encourage patient to continue using his home pain medication now that pain is under control.  At this time there does not appear to be any evidence of an acute emergency medical condition requiring further emergent evaluation and the patient appears stable for discharge with appropriate outpatient follow up. Diagnosis and return precautions discussed with patient who verbalizes understanding and is agreeable to discharge.           Final Clinical Impression(s) / ED Diagnoses Final diagnoses:  Fall, initial encounter  Acute pain of left shoulder  Acute midline low back pain without sciatica  Sickle cell anemia with pain [facility])    Rx / DC Orders ED Discharge Orders     None         Everlyn Hockey, PA-C 07/22/23 1048    Rafael Bun A, DO 07/24/23 1559

## 2023-07-19 NOTE — ED Triage Notes (Signed)
 Pt says he fell in the shower (slipped and fell) yesterday, hit the lower back, and the left shoulder. No LOC. Dizziness. Reports it "triggered my sickle cell when I fell". Last med about about an hour ago.

## 2023-07-19 NOTE — Discharge Instructions (Addendum)
 X-rays and CT scan of your shoulder and back do not show any broken bones since fall.  Continue using home pain medications and follow up with patient care center as needed.

## 2023-07-23 ENCOUNTER — Inpatient Hospital Stay: Payer: Self-pay | Admitting: Nurse Practitioner

## 2023-07-24 ENCOUNTER — Other Ambulatory Visit: Payer: Self-pay | Admitting: Nurse Practitioner

## 2023-07-24 DIAGNOSIS — D571 Sickle-cell disease without crisis: Secondary | ICD-10-CM

## 2023-07-24 MED ORDER — OXYCODONE HCL ER 15 MG PO T12A: 15.0000 mg | EXTENDED_RELEASE_TABLET | Freq: Two times a day (BID) | ORAL | 0 refills | Status: DC

## 2023-07-24 NOTE — Telephone Encounter (Signed)
Please advise in Captree absence. Thank you . KH

## 2023-07-24 NOTE — Telephone Encounter (Signed)
 Copied from CRM [id number]. Topic: Clinical - Medication Refill >> Jul 24, 2023 11:14 AM Carlatta H wrote: Medication: oxyCODONE  (OXYCONTIN ) 15 mg 12 hr tablet  Has the patient contacted their pharmacy? No (Agent: If no, request that the patient contact the pharmacy for the refill. If patient does not wish to contact the pharmacy document the reason why and proceed with request.) (Agent: If yes, when and what did the pharmacy advise?)  This is the patient's preferred pharmacy:  [facility] [address], [place] - [address] AT [address] [place] [address] Phone: [phone] Fax: [phone]  Is this the correct pharmacy for this prescription? Yes If no, delete pharmacy and type the correct one.   Has the prescription been filled recently? No  Is the patient out of the medication? Yes  Has the patient been seen for an appointment in the last year OR does the patient have an upcoming appointment? Yes  Can we respond through MyChart? No  Agent: Please be advised that Rx refills may take up to 3 business days. We ask that you follow-up with your pharmacy.

## 2023-07-24 NOTE — Progress Notes (Signed)
 RX refill sent to pharmacy. PDMP website reviewed .

## 2023-07-30 ENCOUNTER — Non-Acute Institutional Stay (HOSPITAL_COMMUNITY)
Admission: AD | Admit: 2023-07-30 | Discharge: 2023-07-30 | Disposition: A | Discharge status: Home / Self Care | Source: Ambulatory Visit | Attending: Internal Medicine | Admitting: Internal Medicine

## 2023-07-30 ENCOUNTER — Telehealth (HOSPITAL_COMMUNITY): Payer: Self-pay

## 2023-07-30 DIAGNOSIS — G894 Chronic pain syndrome: Secondary | ICD-10-CM | POA: Insufficient documentation

## 2023-07-30 DIAGNOSIS — Z79891 Long term (current) use of opiate analgesic: Secondary | ICD-10-CM | POA: Diagnosis not present

## 2023-07-30 DIAGNOSIS — D631 Anemia in chronic kidney disease: Secondary | ICD-10-CM | POA: Diagnosis not present

## 2023-07-30 DIAGNOSIS — N184 Chronic kidney disease, stage 4 (severe): Secondary | ICD-10-CM | POA: Insufficient documentation

## 2023-07-30 DIAGNOSIS — D5700 Hb-SS disease with crisis, unspecified: Secondary | ICD-10-CM | POA: Diagnosis present

## 2023-07-30 LAB — CBC WITH DIFFERENTIAL/PLATELET
Abs Immature Granulocytes: 0.02 10*3/uL (ref 0.00–0.07)
Basophils Absolute: 0 10*3/uL (ref 0.0–0.1)
Basophils Relative: 0 %
Eosinophils Absolute: 0.1 10*3/uL (ref 0.0–0.5)
Eosinophils Relative: 1 %
HCT: 18.7 % — ABNORMAL LOW (ref 39.0–52.0)
Hemoglobin: 6.2 g/dL — CL (ref 13.0–17.0)
Immature Granulocytes: 0 %
Lymphocytes Relative: 51 %
Lymphs Abs: 3.9 10*3/uL (ref 0.7–4.0)
MCH: 35.8 pg — ABNORMAL HIGH (ref 26.0–34.0)
MCHC: 33.2 g/dL (ref 30.0–36.0)
MCV: 108.1 fL — ABNORMAL HIGH (ref 80.0–100.0)
Monocytes Absolute: 0.2 10*3/uL (ref 0.1–1.0)
Monocytes Relative: 2 %
Neutro Abs: 3.6 10*3/uL (ref 1.7–7.7)
Neutrophils Relative %: 46 %
Platelets: 244 10*3/uL (ref 150–400)
RBC: 1.73 MIL/uL — ABNORMAL LOW (ref 4.22–5.81)
WBC: 7.7 10*3/uL (ref 4.0–10.5)
nRBC: 1 % — ABNORMAL HIGH (ref 0.0–0.2)

## 2023-07-30 LAB — RAPID URINE DRUG SCREEN, HOSP PERFORMED
Amphetamines: NOT DETECTED
Barbiturates: NOT DETECTED
Benzodiazepines: NOT DETECTED
Cocaine: POSITIVE — AB
Opiates: POSITIVE — AB
Tetrahydrocannabinol: NOT DETECTED

## 2023-07-30 LAB — RETICULOCYTES
Immature Retic Fract: 26.7 % — ABNORMAL HIGH (ref 2.3–15.9)
RBC.: 1.71 MIL/uL — ABNORMAL LOW (ref 4.22–5.81)
Retic Count, Absolute: 68.2 10*3/uL (ref 19.0–186.0)
Retic Ct Pct: 4 % — ABNORMAL HIGH (ref 0.4–3.1)

## 2023-07-30 LAB — LACTATE DEHYDROGENASE: LDH: 104 U/L (ref 98–192)

## 2023-07-30 MED ORDER — POLYETHYLENE GLYCOL 3350 17 G PO PACK: 17.0000 g | PACK | Freq: Every day | ORAL | Status: DC | PRN

## 2023-07-30 MED ORDER — DARBEPOETIN ALFA 150 MCG/0.3ML IJ SOSY
150.0000 ug | PREFILLED_SYRINGE | INTRAMUSCULAR | Status: DC
  Administered 2023-07-30: 150 ug via SUBCUTANEOUS
  Filled 2023-07-30: qty 0.3

## 2023-07-30 MED ORDER — SENNOSIDES-DOCUSATE SODIUM 8.6-50 MG PO TABS: 1.0000 | ORAL_TABLET | Freq: Two times a day (BID) | ORAL | Status: DC

## 2023-07-30 MED ORDER — SODIUM CHLORIDE 0.45 % IV SOLN: INTRAVENOUS | Status: DC

## 2023-07-30 MED ORDER — ONDANSETRON HCL 4 MG/2ML IJ SOLN
4.0000 mg | Freq: Four times a day (QID) | INTRAMUSCULAR | Status: DC | PRN
  Filled 2023-07-30: qty 2

## 2023-07-30 MED ORDER — SODIUM CHLORIDE 0.9% FLUSH: 9.0000 mL | INTRAVENOUS | Status: DC | PRN

## 2023-07-30 MED ORDER — DIPHENHYDRAMINE HCL 25 MG PO CAPS
25.0000 mg | ORAL_CAPSULE | ORAL | Status: DC | PRN
  Filled 2023-07-30: qty 1

## 2023-07-30 MED ORDER — ACETAMINOPHEN 500 MG PO TABS
1000.0000 mg | ORAL_TABLET | Freq: Once | ORAL | Status: AC
  Filled 2023-07-30: qty 2

## 2023-07-30 MED ORDER — NALOXONE HCL 0.4 MG/ML IJ SOLN: 0.4000 mg | INTRAMUSCULAR | Status: DC | PRN

## 2023-07-30 MED ORDER — HYDROMORPHONE 1 MG/ML IV SOLN
INTRAVENOUS | Status: DC
  Filled 2023-07-30: qty 30

## 2023-07-30 NOTE — Progress Notes (Signed)
 Critical Value   Test result: Hemoglobin 6.2   Time and Date notified: 07/30/23 at 12:00 pm  Provider notified: Darvin England, NP  Action taken:  MD will consult with [facility] due to patient's elevated iron levels. Possible blood transfusion.

## 2023-07-30 NOTE — Telephone Encounter (Signed)
 Pt called the day hospital wanting to come in today for sickle cell pain treatment. Pt reports 10/10 pain to both legs and knees. Pt reports tripping over a tree root yesterday and falling on both knees. Pt reports taking Oxycodone  10 mg and OxyContin  15 mg at 7 AM. Pt denies being to the ER recently. Pt denies fever, chest pain, N/V/D, abdominal pain, and priapism. [name], NP notified and per provider pt can come to the day hospital today. Pt notified and verbalized understanding. Per pt his mom will be his transportation today.

## 2023-07-30 NOTE — Progress Notes (Addendum)
 Patient admitted to the day infusion hospital for sickle cell pain crisis. Initially, patient reported bilateral leg pain rated 9/10. For pain management, patient placed on Dilaudid  PCA, given PO Tylenol  and hydrated with IV fluids. Aranesp  sub-q injection given for critical hemoglobin. At discharge, patient rated pain at 4/10. Vital signs stable. AVS offered but patient refused. Patient alert, oriented and ambulatory at discharge.

## 2023-07-30 NOTE — Telephone Encounter (Signed)
 Pt called the day hospital wanting to come in to the day hospital today for sickle cell pain treatment. Pt reports 10/10 pain to legs and knees. Pt reports taking

## 2023-07-30 NOTE — Discharge Summary (Cosign Needed)
 Physician Discharge Summary  [name] [name]. UVO:[id number] DOB: December 15, 1980 DOA: 07/30/2023  PCP: [name], NP  Admit date: 07/30/2023  Discharge date: 07/30/2023  Time spent: 30 minutes  Discharge Diagnoses:  Principal Problem:   Sickle cell anemia with pain [facility])   Discharge Condition: Stable  Diet recommendation: Regular  History of present illness:  [name] [name]. is a 43 y.o. male with history of sickle cell disease, chronic pain syndrome, opiate dependence and tolerance, anemia of chronic disease, and CKD stage IV who presented to the day emergency department with major complaints of generalized body pain but primarily in his lower extremities and back, rates pain  at 9/10. He describes this pain as constant and achy and throbbing. He took his home medications with no sustained relief. He denies any fever, headache, cough, blurry vision, chest pain, shortness of breath, nausea, vomiting or diarrhea. No urinary symptoms. He denies any recent travels, sick contacts     Hospital Course:  [name] [name]. was admitted to the day hospital with sickle cell painful crisis. Patient was treated with IV fluid, weight based IV Dilaudid  PCA, clinician assisted doses as deemed appropriate, and other adjunct therapies per sickle cell pain management protocol. [name] [name] showed significant improvement symptomatically, pain improved from 10/10 to 5/10 at the time of discharge. [name] [name] tested positive for Cocaine  again during this encounter. This may be contributing to his frequent falls. He has been counseled extensively again. His Hemoglobin dropped to 6.1, although lower than before, patient has iron overload and would not do well with frequent blood transfusion. He was instead restarted on Aranesp  during this encounter.  Patient was discharged home in a hemodynamically stable condition. [name] [name] will follow-up at the clinic as previously scheduled, continue with home  medications as per prior to admission.  Discharge Instructions We discussed the need for good hydration, monitoring of hydration status, avoidance of heat, cold, stress, and infection triggers. We discussed the need to be compliant with taking other home medications. [name] [name] was reminded of the need to seek medical attention immediately if any symptom of bleeding, anemia, or infection occurs.  Discharge Exam: Vitals:   07/30/23 1601 07/30/23 1602  BP: 110/86   Pulse: 100   Resp: 10 10  Temp:    SpO2: 96%     General appearance: alert, cooperative and no distress Eyes: conjunctivae/corneas clear. PERRL, EOM's intact. Fundi benign. Neck: no adenopathy, no carotid bruit, no JVD, supple, symmetrical, trachea midline and thyroid  not enlarged, symmetric, no tenderness/mass/nodules Back: symmetric, no curvature. ROM normal. No CVA tenderness. Resp: clear to auscultation bilaterally Chest wall: no tenderness Cardio: regular rate and rhythm, S1, S2 normal, no murmur, click, rub or gallop GI: soft, non-tender; bowel sounds normal; no masses,  no organomegaly Extremities: extremities normal, atraumatic, no cyanosis or edema Pulses: 2+ and symmetric Skin: Skin color, texture, turgor normal. No rashes or lesions Neurologic: Grossly normal  Discharge Instructions     Call MD for:  severe uncontrolled pain   Complete by: As directed    Call MD for:  temperature >100.4   Complete by: As directed    Diet - low sodium heart healthy   Complete by: As directed    Increase activity slowly   Complete by: As directed       Allergies as of 07/30/2023       Reactions   Nsaids Other (See Comments)   Kidney disease   Morphine  And Codeine  Other (See Comments)  Really bad headaches         Medication List     TAKE these medications    amitriptyline  25 MG tablet Commonly known as: ELAVIL  TAKE 2 TABLETS(50 MG) BY MOUTH AT BEDTIME What changed: See the new instructions.   calcitRIOL   0.25 MCG capsule Commonly known as: ROCALTROL  Take 4 capsules by mouth daily with breakfast.   Ferriprox  Twice-A-Day 1000 MG Tabs Generic drug: Deferiprone  (Twice Daily) Take 4,000 mg by mouth daily.   folic acid  1 MG tablet Commonly known as: FOLVITE  TAKE 1 TABLET BY MOUTH EVERY DAY   hydroxyurea  500 MG capsule Commonly known as: HYDREA  Take 1 capsule by mouth in the morning on Sun/Tues/Wed/Thurs/Sat and 2 capsules on Mon/Fri What changed: additional instructions   Oxycodone  HCl 10 MG Tabs Take 1 tablet (10 mg total) by mouth See admin instructions. Take 10 mg by mouth every 6 hours   oxyCODONE  15 mg 12 hr tablet Commonly known as: OXYCONTIN  Take 1 tablet (15 mg) by mouth every 12 hours.   sodium bicarbonate  650 MG tablet Take 650 mg by mouth with breakfast, with lunch, and with evening meal.   sodium zirconium cyclosilicate  10 g Pack packet Commonly known as: LOKELMA  Take 10 g by mouth daily.   Tylenol  8 Hour Arthritis Pain 650 MG CR tablet Generic drug: acetaminophen  Take 1,300 mg by mouth as needed for pain.       Allergies  Allergen Reactions   Nsaids Other (See Comments)    Kidney disease   Morphine  And Codeine  Other (See Comments)    Really bad headaches      Significant Diagnostic Studies: DG Chest 2 View Result Date: 07/19/2023 CLINICAL DATA:  Sickle cell disease.  Status post fall. EXAM: CHEST - 2 VIEW COMPARISON:  06/03/2023 FINDINGS: The heart size is normal. There is no pleural fluid or interstitial edema. No airspace consolidation or pneumothorax. Chronic coarsened interstitial markings with lower lung zone scarring noted. Signs chronic calcified spleen in the left upper quadrant of the abdomen. Right upper quadrant cholecystectomy clips. Bony stigmata of sickle cell disease. Bilateral humeral head AVN, more advanced on the right. IMPRESSION: 1. No acute cardiopulmonary abnormalities. 2. Chronic coarsened interstitial markings with lower lung zone  scarring. 3. Bony stigmata of sickle cell disease. Electronically Signed   By: Kimberley Penman M.D.   On: 07/19/2023 07:32   CT Lumbar Spine Wo Contrast Result Date: 07/19/2023 EXAM: CT OF THE LUMBAR SPINE WITHOUT CONTRAST 07/19/2023 07:19:00 AM TECHNIQUE: CT of the lumbar spine was performed without the administration of intravenous contrast. Multiplanar reformatted images are provided for review. Automated exposure control, iterative reconstruction, and/or weight based adjustment of the mA/kV was utilized to reduce the radiation dose to as low as reasonably achievable. COMPARISON: None available. CLINICAL HISTORY: Back trauma, no prior imaging (Age >= 16y). Fell in the shower (slipped and fell) yesterday, hit the lower back, and the left shoulder. No LOC. Dizziness. Personal history of sickle cell disease. FINDINGS: BONES AND ALIGNMENT: 5 non-weightbearing lumbar-type vertebral bodies are present. Straightening of the normal lumbar lordosis is present. Leftward curvature is present in the lower lumbar spine. DEGENERATIVE CHANGES: Prominent schmorl's nodes are present at L3-4 and along the superior endplate of L2 and L3. Schmorl's nodes are present at T11-12 and along the inferior endplate of T12. Mild disc bulging is present at L3-4, L4-5, and L5-S1 without focal stenosis. SOFT TISSUES: No paraspinal hematoma. LIMITED RETROPERITONEUM: Limited images of the retroperitoneum demonstrate no acute abnormality. Additional  findings: Diffuse osteopenia and endplate changes are consistent with sickle cell disease. No acute fractures are present. IMPRESSION: 1. No acute fractures. 2. Mild disc bulging at L3-4, L4-5, and L5-S1 without focal stenosis. 3. Diffuse osteopenia and endplate changes consistent with sickle cell disease. Electronically signed by: Audree Leas MD 07/19/2023 07:31 AM EDT RP Workstation: [id number]   DG Shoulder Left Result Date: 07/19/2023 CLINICAL DATA:  Fall.  Sickle cell pain. EXAM: LEFT  SHOULDER - 2+ VIEW COMPARISON:  None Available. FINDINGS: No signs of acute fracture or dislocation. Bony stigmata of sickle cell disease identified. Subchondral sclerosis along the left humeral head is noted compatible with early AVN. Chronic coarsened interstitial markings are noted within the imaged portions of the lungs, also compatible with sickle cell disease. IMPRESSION: 1. No acute findings. 2. Bony stigmata of sickle cell disease. 3. 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 Electronically Signed   By: Kimberley Penman M.D.   On: 07/19/2023 07:30    Signed:  Lorel Roes NP  07/31/2023, 10:53 AM  Evaluation and management procedures were performed by the Advanced Practitioner under my supervision and collaboration. I have reviewed the Advanced Practitioner's note and chart, and I agree with the management and plan.   Olugbemiga [name], MD, MHA, CPE, [facility] Patient [facility] [place], [place] [phone] 08/01/2023, 9:48 AM

## 2023-07-30 NOTE — H&P (Cosign Needed Addendum)
 [facility] History and Physical  [name] [name]. ZOX:[id number] DOB: 1980-05-01 DOA: 07/30/2023  PCP: [name], NP   Chief Complaint:  Chief Complaint  Patient presents with   Sickle Cell Pain Crisis    HPI: [name] Coin. is a 43 y.o. male with history of sickle cell disease,  Chronic pain syndrome, opiate dependence and tolerance, anemia of chronic disease, and CKD stage IV who presented to the day hospital with major complaints of generalized body pain but primarily in his lower extremities and back, rates pain at 9/10. Patient reports recent falls in the last 2 days, slight  bruising bilateral lower extremity with no broken bones. He describes this pain as constant, achy and throbbing. He took his home medications with no sustained relief. He denies any fever, headache, cough, blurry vision, chest pain, shortness of breath, nausea, vomiting or diarrhea. No urinary symptoms. He denies any recent travels, sick contacts     Systemic Review: General: The patient denies anorexia, fever, weight loss Cardiac: Denies chest pain, syncope, palpitations, pedal edema  Respiratory: Denies cough, shortness of breath, wheezing GI: Denies severe indigestion/heartburn, abdominal pain, nausea, vomiting, diarrhea and constipation GU: Denies hematuria, incontinence, dysuria  Musculoskeletal: Generalize body ache. Bilateral lower extremity tenderness. Skin: Denies suspicious skin lesions Neurologic: Denies focal weakness or numbness, change in vision  Past Medical History:  Diagnosis Date   Abscess of right iliac fossa 09/24/2018   required Perc Drain 09/24/2018   Arachnoid Cyst of brain bilaterally    2 really small ones in the back of my head; inside; saw them w/MRI (09/25/2012)   Bacterial pneumonia ~ 2012   caught it here in the hospital (09/25/2012)   Chronic kidney disease    from my sickle cell (09/25/2012)   CKD (chronic kidney disease), stage II     Colitis 04/19/2017   CT scan abd/ pelvis   GERD (gastroesophageal reflux disease)    after I eat alot of spicey foods (09/25/2012)   Gynecomastia, male 07/10/2012   History of blood transfusion    always related to sickle cell crisis (09/25/2012)   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   Noted in noted from Hematology notes at [facility]   Migraines    take RX qd to prevent them (09/25/2012)   Opioid dependence with withdrawal (HCC) 08/30/2012   Renal insufficiency    Sickle cell anemia (HCC)    Sickle cell crisis (HCC) 09/25/2012   Sickle cell nephropathy (HCC) 07/10/2012   Sinus tachycardia    Tachycardia with heart rate 121-140 beats per minute with ambulation 08/04/2016    Past Surgical History:  Procedure Laterality Date   ABCESS DRAINAGE     CHOLECYSTECTOMY  ~ 2012   COLONOSCOPY N/A 04/23/2017   Procedure: COLONOSCOPY;  Surgeon: Tobin Forts, MD;  Location: [facility];  Service: Endoscopy;  Laterality: N/A;   IR FLUORO GUIDE CV LINE RIGHT  12/17/2016   IR FLUORO GUIDE CV LINE RIGHT  02/07/2021   IR RADIOLOGIST EVAL & MGMT  10/02/2018   IR RADIOLOGIST EVAL & MGMT  10/15/2018   IR REMOVAL TUN CV CATH W/O FL  12/21/2016   IR REMOVAL TUN CV CATH W/O FL  02/11/2021   IR US  GUIDE VASC ACCESS RIGHT  12/17/2016   IR US  GUIDE VASC ACCESS RIGHT  02/07/2021   spleenectomy      Allergies  Allergen Reactions   Nsaids Other (See Comments)    Kidney disease   Morphine   And Codeine  Other (See Comments)    Really bad headaches     Family History  Problem Relation Age of Onset   Breast cancer Mother       Prior to Admission medications   Medication Sig Start Date End Date Taking? Authorizing Provider  amitriptyline  (ELAVIL ) 25 MG tablet TAKE 2 TABLETS(50 MG) BY MOUTH AT BEDTIME Patient taking differently: Take 50-125 mg by mouth at bedtime. 05/24/23  Yes [name], NP  calcitRIOL  (ROCALTROL ) 0.25 MCG capsule Take 4 capsules by mouth daily with breakfast.  11/16/20  Yes [provider]  Deferiprone , Twice Daily, (FERRIPROX  TWICE-A-DAY) 1000 MG TABS Take 4,000 mg by mouth daily. 05/01/22  Yes Sigurd Driver, FNP  folic acid  (FOLVITE ) 1 MG tablet TAKE 1 TABLET BY MOUTH EVERY DAY 03/22/20  Yes Hollis, Lachina M, FNP  hydroxyurea  (HYDREA ) 500 MG capsule Take 1 capsule by mouth in the morning on Sun/Tues/Wed/Thurs/Sat and 2 capsules on Mon/Fri Patient taking differently: Take 500-1,000 mg by mouth See admin instructions. Take 1500 mg by mouth on Tuesday and Thursday. Take 500 mg by mouth on Sunday, Monday, Wednesday, Friday, and Saturday 01/25/23  Yes Nichols, [name], NP  oxyCODONE  (OXYCONTIN ) 15 mg 12 hr tablet Take 1 tablet (15 mg) by mouth every 12 hours. 07/24/23  Yes Lorel Roes, NP  Oxycodone  HCl 10 MG TABS Take 1 tablet (10 mg total) by mouth See admin instructions. Take 10 mg by mouth every 6 hours 07/16/23  Yes [name], [name], MD  sodium bicarbonate  650 MG tablet Take 650 mg by mouth with breakfast, with lunch, and with evening meal. 11/07/18  Yes [provider]  TYLENOL  8 HOUR ARTHRITIS PAIN 650 MG CR tablet Take 1,300 mg by mouth as needed for pain.   Yes [provider]  sodium zirconium cyclosilicate  (LOKELMA ) 10 g PACK packet Take 10 g by mouth daily. 04/21/23 04/22/24  [provider]     Physical Exam: Vitals:   07/30/23 0946 07/30/23 1032 07/30/23 1142 07/30/23 1337  BP: 101/67  107/78 110/77  Pulse: 92  82 93  Resp: 16 16 14 14   Temp: (!) 97.2 F (36.2 C)     TempSrc: Temporal     SpO2: 99%  100% 97%    General: Alert, awake, afebrile, anicteric, not in obvious distress HEENT: Normocephalic and Atraumatic, Mucous membranes pink                PERRLA; EOM intact; No scleral icterus,                 Nares: Patent, Oropharynx: Clear, Fair Dentition                 Neck: FROM, no cervical lymphadenopathy, thyromegaly, carotid bruit or JVD;  CHEST WALL: No tenderness  CHEST: Normal  respiration, clear to auscultation bilaterally  HEART: Regular rate and rhythm; no murmurs rubs or gallops  BACK: No kyphosis or scoliosis; no CVA tenderness  ABDOMEN: Positive Bowel Sounds, soft, non-tender; no masses, no organomegaly EXTREMITIES: No cyanosis, clubbing, or edema SKIN:  no rash or ulceration  CNS: Alert and Oriented x 4, Nonfocal exam, CN 2-12 intact  Labs on Admission:  Basic Metabolic Panel: No results for input(s): NA, K, CL, CO2, GLUCOSE, BUN, CREATININE, CALCIUM , MG, PHOS in the last 168 hours. Liver Function Tests: No results for input(s): AST, ALT, ALKPHOS, BILITOT, PROT, ALBUMIN  in the last 168 hours. No results for input(s): LIPASE, AMYLASE in the last  168 hours. No results for input(s): AMMONIA in the last 168 hours. CBC: Recent Labs  Lab 07/30/23 0955  WBC 7.7  NEUTROABS 3.6  HGB 6.2*  HCT 18.7*  MCV 108.1*  PLT 244   Cardiac Enzymes: No results for input(s): CKTOTAL, CKMB, CKMBINDEX, TROPONINI in the last 168 hours.  BNP (last 3 results) No results for input(s): BNP in the last 8760 hours.  ProBNP (last 3 results) No results for input(s): PROBNP in the last 8760 hours.  CBG: No results for input(s): GLUCAP in the last 168 hours.   Assessment/Plan Principal Problem:   Sickle cell anemia with pain (HCC)  Admits to the Day Hospital for extended observation IVF 0.45% Saline @ 125 mls/hour Weight based Dilaudid  PCA started within 30 minutes of admission Acetaminophen  1000 mg x 1 dose Labs: CBCD, CMP, Retic Count and LDH Monitor vitals very closely, Re-evaluate pain scale every hour 2 L of Oxygen  by [place] Patient will be re-evaluated for pain in the context of function and relationship to baseline as care progresses. If no significant relieve from pain (remains above 5/10) will transfer patient to inpatient services for further evaluation and management  Code Status: Full  Family  Communication: None  DVT Prophylaxis: Ambulate as tolerated   Time spent: 35 Minutes  Lorel Roes NP   If 7PM-7AM, please contact night-coverage [email or web address] 07/30/2023, 3:27 PM

## 2023-07-31 ENCOUNTER — Telehealth: Payer: Self-pay

## 2023-07-31 NOTE — Telephone Encounter (Signed)
Spoke with patient and advised him of message below.

## 2023-08-02 ENCOUNTER — Other Ambulatory Visit: Payer: Self-pay | Admitting: Nurse Practitioner

## 2023-08-02 ENCOUNTER — Other Ambulatory Visit

## 2023-08-02 ENCOUNTER — Other Ambulatory Visit: Payer: Self-pay

## 2023-08-02 DIAGNOSIS — D5700 Hb-SS disease with crisis, unspecified: Secondary | ICD-10-CM

## 2023-08-02 DIAGNOSIS — Z79899 Other long term (current) drug therapy: Secondary | ICD-10-CM

## 2023-08-03 ENCOUNTER — Ambulatory Visit: Payer: Self-pay | Admitting: Nurse Practitioner

## 2023-08-03 LAB — CBC WITH DIFFERENTIAL/PLATELET
Basophils Absolute: 0 10*3/uL (ref 0.0–0.2)
Basos: 0 %
EOS (ABSOLUTE): 0 10*3/uL (ref 0.0–0.4)
Eos: 0 %
Hematocrit: 21.6 % — ABNORMAL LOW (ref 37.5–51.0)
Hemoglobin: 7.2 g/dL — ABNORMAL LOW (ref 13.0–17.7)
Immature Grans (Abs): 0 10*3/uL (ref 0.0–0.1)
Immature Granulocytes: 0 %
Lymphocytes Absolute: 4.2 10*3/uL — ABNORMAL HIGH (ref 0.7–3.1)
Lymphs: 62 %
MCH: 37.1 pg — ABNORMAL HIGH (ref 26.6–33.0)
MCHC: 33.3 g/dL (ref 31.5–35.7)
MCV: 111 fL — ABNORMAL HIGH (ref 79–97)
Monocytes Absolute: 0.2 10*3/uL (ref 0.1–0.9)
Monocytes: 3 %
NRBC: 1 % — ABNORMAL HIGH (ref 0–0)
Neutrophils Absolute: 2.4 10*3/uL (ref 1.4–7.0)
Neutrophils: 35 %
Platelets: 262 10*3/uL (ref 150–450)
RBC: 1.94 x10E6/uL — CL (ref 4.14–5.80)
WBC: 6.8 10*3/uL (ref 3.4–10.8)

## 2023-08-05 ENCOUNTER — Encounter (HOSPITAL_COMMUNITY): Payer: Self-pay

## 2023-08-05 ENCOUNTER — Other Ambulatory Visit: Payer: Self-pay

## 2023-08-05 ENCOUNTER — Emergency Department (HOSPITAL_COMMUNITY)
Admission: EM | Admit: 2023-08-05 | Discharge: 2023-08-05 | Disposition: A | Discharge status: Home / Self Care | Source: Home / Self Care | Attending: Emergency Medicine | Admitting: Emergency Medicine

## 2023-08-05 DIAGNOSIS — N189 Chronic kidney disease, unspecified: Secondary | ICD-10-CM | POA: Insufficient documentation

## 2023-08-05 DIAGNOSIS — E875 Hyperkalemia: Secondary | ICD-10-CM | POA: Insufficient documentation

## 2023-08-05 DIAGNOSIS — D5700 Hb-SS disease with crisis, unspecified: Secondary | ICD-10-CM | POA: Diagnosis not present

## 2023-08-05 DIAGNOSIS — R000 Tachycardia, unspecified: Secondary | ICD-10-CM | POA: Insufficient documentation

## 2023-08-05 DIAGNOSIS — N289 Disorder of kidney and ureter, unspecified: Secondary | ICD-10-CM

## 2023-08-05 LAB — CBC WITH DIFFERENTIAL/PLATELET
Abs Immature Granulocytes: 0.01 10*3/uL (ref 0.00–0.07)
Basophils Absolute: 0 10*3/uL (ref 0.0–0.1)
Basophils Relative: 0 %
Eosinophils Absolute: 0.1 10*3/uL (ref 0.0–0.5)
Eosinophils Relative: 1 %
HCT: 23.7 % — ABNORMAL LOW (ref 39.0–52.0)
Hemoglobin: 7.9 g/dL — ABNORMAL LOW (ref 13.0–17.0)
Immature Granulocytes: 0 %
Lymphocytes Relative: 74 %
Lymphs Abs: 7.4 10*3/uL — ABNORMAL HIGH (ref 0.7–4.0)
MCH: 38.2 pg — ABNORMAL HIGH (ref 26.0–34.0)
MCHC: 33.3 g/dL (ref 30.0–36.0)
MCV: 114.5 fL — ABNORMAL HIGH (ref 80.0–100.0)
Monocytes Absolute: 0.2 10*3/uL (ref 0.1–1.0)
Monocytes Relative: 2 %
Neutro Abs: 2.3 10*3/uL (ref 1.7–7.7)
Neutrophils Relative %: 23 %
Platelets: 229 10*3/uL (ref 150–400)
RBC: 2.07 MIL/uL — ABNORMAL LOW (ref 4.22–5.81)
WBC: 10 10*3/uL (ref 4.0–10.5)
nRBC: 1.2 % — ABNORMAL HIGH (ref 0.0–0.2)

## 2023-08-05 LAB — COMPREHENSIVE METABOLIC PANEL WITH GFR
ALT: 37 U/L (ref 0–44)
AST: 42 U/L — ABNORMAL HIGH (ref 15–41)
Albumin: 3.2 g/dL — ABNORMAL LOW (ref 3.5–5.0)
Alkaline Phosphatase: 226 U/L — ABNORMAL HIGH (ref 38–126)
Anion gap: 9 (ref 5–15)
BUN: 57 mg/dL — ABNORMAL HIGH (ref 6–20)
CO2: 17 mmol/L — ABNORMAL LOW (ref 22–32)
Calcium: 8.7 mg/dL — ABNORMAL LOW (ref 8.9–10.3)
Chloride: 110 mmol/L (ref 98–111)
Creatinine, Ser: 4.2 mg/dL — ABNORMAL HIGH (ref 0.61–1.24)
GFR, Estimated: 17 mL/min — ABNORMAL LOW (ref >60)
Glucose, Bld: 98 mg/dL (ref 70–99)
Potassium: 5.5 mmol/L — ABNORMAL HIGH (ref 3.5–5.1)
Sodium: 136 mmol/L (ref 135–145)
Total Bilirubin: 0.9 mg/dL (ref 0.0–1.2)
Total Protein: 7.4 g/dL (ref 6.5–8.1)

## 2023-08-05 LAB — RETICULOCYTES
Immature Retic Fract: 28.9 % — ABNORMAL HIGH (ref 2.3–15.9)
RBC.: 2.08 MIL/uL — ABNORMAL LOW (ref 4.22–5.81)
Retic Count, Absolute: 153.3 10*3/uL (ref 19.0–186.0)
Retic Ct Pct: 7.4 % — ABNORMAL HIGH (ref 0.4–3.1)

## 2023-08-05 LAB — BASIC METABOLIC PANEL WITH GFR
Anion gap: 4 — ABNORMAL LOW (ref 5–15)
BUN: 47 mg/dL — ABNORMAL HIGH (ref 6–20)
CO2: 15 mmol/L — ABNORMAL LOW (ref 22–32)
Calcium: 6.9 mg/dL — ABNORMAL LOW (ref 8.9–10.3)
Chloride: 119 mmol/L — ABNORMAL HIGH (ref 98–111)
Creatinine, Ser: 3.14 mg/dL — ABNORMAL HIGH (ref 0.61–1.24)
GFR, Estimated: 25 mL/min — ABNORMAL LOW (ref >60)
Glucose, Bld: 75 mg/dL (ref 70–99)
Potassium: 5.9 mmol/L — ABNORMAL HIGH (ref 3.5–5.1)
Sodium: 138 mmol/L (ref 135–145)

## 2023-08-05 LAB — CBG MONITORING, ED: Glucose-Capillary: 162 mg/dL — ABNORMAL HIGH (ref 70–99)

## 2023-08-05 MED ORDER — SODIUM CHLORIDE 0.9 % IV BOLUS
1000.0000 mL | Freq: Once | INTRAVENOUS | Status: AC
  Administered 2023-08-05: 1000 mL via INTRAVENOUS

## 2023-08-05 MED ORDER — SODIUM ZIRCONIUM CYCLOSILICATE 10 G PO PACK
10.0000 g | PACK | Freq: Once | ORAL | Status: AC
  Administered 2023-08-05: 10 g via ORAL
  Filled 2023-08-05: qty 1

## 2023-08-05 MED ORDER — HYDROMORPHONE HCL 1 MG/ML IJ SOLN
2.0000 mg | Freq: Once | INTRAMUSCULAR | Status: AC
  Administered 2023-08-05: 2 mg via INTRAVENOUS
  Filled 2023-08-05: qty 2

## 2023-08-05 MED ORDER — INSULIN ASPART 100 UNIT/ML IV SOLN
5.0000 [IU] | Freq: Once | INTRAVENOUS | Status: AC
  Administered 2023-08-05: 5 [IU] via INTRAVENOUS
  Filled 2023-08-05: qty 0.05

## 2023-08-05 MED ORDER — DEXTROSE 50 % IV SOLN
1.0000 | Freq: Once | INTRAVENOUS | Status: AC
  Administered 2023-08-05: 50 mL via INTRAVENOUS
  Filled 2023-08-05: qty 50

## 2023-08-05 MED ORDER — ONDANSETRON HCL 4 MG/2ML IJ SOLN
4.0000 mg | INTRAMUSCULAR | Status: DC | PRN
  Administered 2023-08-05: 4 mg via INTRAVENOUS
  Filled 2023-08-05: qty 2

## 2023-08-05 MED ORDER — OXYCODONE HCL 5 MG PO TABS
10.0000 mg | ORAL_TABLET | Freq: Once | ORAL | Status: AC
  Administered 2023-08-05: 10 mg via ORAL
  Filled 2023-08-05: qty 2

## 2023-08-05 NOTE — ED Notes (Signed)
 Pt states he is ready to go, provider is aware that patient is ready for discharge.

## 2023-08-05 NOTE — Discharge Instructions (Addendum)
 Evaluation today was overall reassuring.  Labs did show some worsening of your renal function.  Please follow-up with your nephrologist.  If you had difficulty urinating, have flank pain, develop fever or any other concerning symptom please return to the ED for further evaluation.  Also I am going to put you on Lokelma  for a few days as your potassium level was elevated.

## 2023-08-05 NOTE — ED Triage Notes (Signed)
 Pt c/o sickle cell pain in both pain legs and very fatigued that started yesterday.

## 2023-08-05 NOTE — ED Provider Notes (Signed)
 [place] [facility]ERGENCY DEPARTMENT AT [facility] Provider Note   CSN: [id number] Arrival date & time: 08/05/23  9277     Patient presents with: Sickle Cell Pain Crisis  HPI [name] [name]. is a 43 y.o. male with sickle cell anemia, CKD,  presenting for bilateral leg pain and fatigue that started yesterday.  He states the pain feels like it is in his bones and it is very much like pain he is had before with pain crisis in the past.  Denies any weakness or numbness in his legs.  Still able to walk.  Denies trauma to his legs.  Also states he has been more tired than usual starting yesterday.  Denies chest pain or shortness of breath.    Sickle Cell Pain Crisis      Prior to Admission medications   Medication Sig Start Date End Date Taking? Authorizing Provider  amitriptyline  (ELAVIL ) 25 MG tablet TAKE 2 TABLETS(50 MG) BY MOUTH AT BEDTIME Patient taking differently: Take 50-125 mg by mouth at bedtime. 05/24/23   [name], NP  calcitRIOL  (ROCALTROL ) 0.25 MCG capsule Take 4 capsules by mouth daily with breakfast. 11/16/20   [provider]  Deferiprone , Twice Daily, (FERRIPROX  TWICE-A-DAY) 1000 MG TABS Take 4,000 mg by mouth daily. 05/01/22   Tilford Bertram HERO, FNP  folic acid  (FOLVITE ) 1 MG tablet TAKE 1 TABLET BY MOUTH EVERY DAY 03/22/20   Hollis, Lachina M, FNP  hydroxyurea  (HYDREA ) 500 MG capsule Take 1 capsule by mouth in the morning on Sun/Tues/Wed/Thurs/Sat and 2 capsules on Mon/Fri Patient taking differently: Take 500-1,000 mg by mouth See admin instructions. Take 1500 mg by mouth on Tuesday and Thursday. Take 500 mg by mouth on Sunday, Monday, Wednesday, Friday, and Saturday 01/25/23   Nichols, [name], NP  oxyCODONE  (OXYCONTIN ) 15 mg 12 hr tablet Take 1 tablet (15 mg) by mouth every 12 hours. 07/24/23   Ijaola, [name] M, NP  Oxycodone  HCl 10 MG TABS Take 1 tablet (10 mg total) by mouth See admin instructions. Take 10 mg by mouth every 6 hours 07/16/23   [name],  [name], MD  sodium bicarbonate  650 MG tablet Take 650 mg by mouth with breakfast, with lunch, and with evening meal. 11/07/18   [provider]  sodium zirconium cyclosilicate  (LOKELMA ) 10 g PACK packet Take 10 g by mouth daily. 04/21/23 04/22/24  [provider]  TYLENOL  8 HOUR ARTHRITIS PAIN 650 MG CR tablet Take 1,300 mg by mouth as needed for pain.    [provider]    Allergies: Nsaids and Morphine  and codeine     Review of Systems  Updated Vital Signs BP 116/81   Pulse 94   Temp (!) 97.5 F (36.4 C) (Oral)   Resp 19   Wt 59 kg   SpO2 99%   BMI 16.25 kg/m   Physical Exam Vitals and nursing note reviewed.  HENT:     Head: Normocephalic and atraumatic.     Mouth/Throat:     Mouth: Mucous membranes are moist.   Eyes:     General:        Right eye: No discharge.        Left eye: No discharge.     Conjunctiva/sclera: Conjunctivae normal.    Cardiovascular:     Rate and Rhythm: Normal rate and regular rhythm.     Pulses: Normal pulses.          Radial pulses are 2+ on the right side  and 2+ on the left side.       Dorsalis pedis pulses are 2+ on the right side and 2+ on the left side.     Heart sounds: Normal heart sounds.  Pulmonary:     Effort: Pulmonary effort is normal.     Breath sounds: Normal breath sounds.  Abdominal:     General: Abdomen is flat.     Palpations: Abdomen is soft.   Musculoskeletal:     Comments: Able to actively flex and extend both legs.  Strength is 5/5 bilaterally.  Sensation to light touch is intact.  Cap refill is brisk distally.  Pedal pulses are 2+ bilaterally.  Legs are both atraumatic.  No edema, erythema or ecchymosis noted.   Skin:    General: Skin is warm and dry.   Neurological:     General: No focal deficit present.   Psychiatric:        Mood and Affect: Mood normal.     (all labs ordered are listed, but only abnormal results are displayed) Labs Reviewed  COMPREHENSIVE METABOLIC PANEL  WITH GFR - Abnormal; Notable for the following components:      Result Value   Potassium 5.5 (*)    CO2 17 (*)    BUN 57 (*)    Creatinine, Ser 4.20 (*)    Calcium  8.7 (*)    Albumin  3.2 (*)    AST 42 (*)    Alkaline Phosphatase 226 (*)    GFR, Estimated 17 (*)    All other components within normal limits  RETICULOCYTES - Abnormal; Notable for the following components:   Retic Ct Pct 7.4 (*)    RBC. 2.08 (*)    Immature Retic Fract 28.9 (*)    All other components within normal limits  CBC WITH DIFFERENTIAL/PLATELET - Abnormal; Notable for the following components:   RBC 2.07 (*)    Hemoglobin 7.9 (*)    HCT 23.7 (*)    MCV 114.5 (*)    MCH 38.2 (*)    nRBC 1.2 (*)    Lymphs Abs 7.4 (*)    All other components within normal limits  BASIC METABOLIC PANEL WITH GFR - Abnormal; Notable for the following components:   Potassium 5.9 (*)    Chloride 119 (*)    CO2 15 (*)    BUN 47 (*)    Creatinine, Ser 3.14 (*)    Calcium  6.9 (*)    GFR, Estimated 25 (*)    Anion gap 4 (*)    All other components within normal limits  CBG MONITORING, ED - Abnormal; Notable for the following components:   Glucose-Capillary 162 (*)    All other components within normal limits    EKG: EKG Interpretation Date/Time:  Sunday August 05 2023 09:54:05 EDT Ventricular Rate:  86 PR Interval:  197 QRS Duration:  89 QT Interval:  351 QTC Calculation: 420 R Axis:   56  Text Interpretation: Sinus rhythm Probable left atrial enlargement Nonspecific T abnormalities, lateral leads No significant change since last tracing Confirmed by Dreama Longs ([id number]) on 08/05/2023 9:57:21 AM  Radiology: No results found.   Procedures   Medications Ordered in the ED  ondansetron  (ZOFRAN ) injection 4 mg (4 mg Intravenous Given 08/05/23 0806)  HYDROmorphone  (DILAUDID ) injection 2 mg (2 mg Intravenous Given 08/05/23 0806)  oxyCODONE  (Oxy IR/ROXICODONE ) immediate release tablet 10 mg (10 mg Oral Given 08/05/23  0940)  sodium chloride  0.9 % bolus 1,000 mL (0 mLs Intravenous Stopped  08/05/23 1055)  sodium zirconium cyclosilicate  (LOKELMA ) packet 10 g (10 g Oral Given 08/05/23 0940)  HYDROmorphone  (DILAUDID ) injection 2 mg (2 mg Intravenous Given 08/05/23 1020)  insulin  aspart (novoLOG ) injection 5 Units (5 Units Intravenous Given 08/05/23 1306)    And  dextrose  50 % solution 50 mL (50 mLs Intravenous Given 08/05/23 1304)                                    Medical Decision Making Amount and/or Complexity of Data Reviewed Labs: ordered.  Risk OTC drugs. Prescription drug management.   Initial Impression and Ddx 43 year old well-appearing male presenting for pain crisis.  Exam notable for tachycardia.  DDx includes sickle cell pain crisis, vaso-occlusive disease, acute chest syndrome, symptomatic anemia, other. Patient PMH that increases complexity of ED encounter: Sickle cell anemia, CKD  Interpretation of Diagnostics - I independent reviewed and interpreted the labs as followed: elevated Cr (~1 point above baseline), anemia but improved from last check, hyperkalemia (5.5)  - I personally reviewed interpret EKG which revealed sinus rhythm, QTc of 420, without peaked T waves  Patient Reassessment and Ultimate Disposition/Management Patient initially tachycardic and elevated CR above baseline reduced GFR, there was concern for acute AKI.  Treated with fluids and recheck of BMP did show improvement of his renal function.  Per chart review he does have close follow-up with [facility] neph[facility] and is also seen by [facility] as well.  Pain was well-controlled after 2 mg of Dilaudid  x 2.  Advised that he continue taking his pain medication as prescribed to him at home and follow-up with the sickle cell pain clinic.  Also advised to follow-up with nephrology.  Discussed return precautions.  Discharged good condition.  Patient management required discussion with the following services or consulting groups:   None  Complexity of Problems Addressed Acute complicated illness or Injury  Additional Data Reviewed and Analyzed Further history obtained from: Further history from spouse/family member, Past medical history and medications listed in the EMR, and Prior ED visit notes  Patient Encounter Risk Assessment Consideration of hospitalization       Final diagnoses:  Sickle cell pain crisis [facility])  Renal dysfunction    ED Discharge Orders     None          Lang Norleen POUR, PA-C 08/05/23 1601    Dreama Longs, MD 08/05/23 2339

## 2023-08-05 NOTE — ED Notes (Signed)
 Chaperoned provider assess of buttocks and spots on it.

## 2023-08-06 ENCOUNTER — Emergency Department (HOSPITAL_COMMUNITY)

## 2023-08-06 ENCOUNTER — Encounter (HOSPITAL_COMMUNITY): Payer: Self-pay | Admitting: *Deleted

## 2023-08-06 ENCOUNTER — Other Ambulatory Visit: Payer: Self-pay

## 2023-08-06 ENCOUNTER — Inpatient Hospital Stay (HOSPITAL_COMMUNITY)
Admission: EM | Admit: 2023-08-06 | Discharge: 2023-08-09 | DRG: 812 | Disposition: A | Discharge status: Home / Self Care | Attending: Internal Medicine | Admitting: Internal Medicine

## 2023-08-06 DIAGNOSIS — F1120 Opioid dependence, uncomplicated: Secondary | ICD-10-CM | POA: Diagnosis present

## 2023-08-06 DIAGNOSIS — W19XXXA Unspecified fall, initial encounter: Secondary | ICD-10-CM | POA: Diagnosis present

## 2023-08-06 DIAGNOSIS — Z9081 Acquired absence of spleen: Secondary | ICD-10-CM

## 2023-08-06 DIAGNOSIS — Z79899 Other long term (current) drug therapy: Secondary | ICD-10-CM

## 2023-08-06 DIAGNOSIS — Z8673 Personal history of transient ischemic attack (TIA), and cerebral infarction without residual deficits: Secondary | ICD-10-CM | POA: Diagnosis not present

## 2023-08-06 DIAGNOSIS — F17210 Nicotine dependence, cigarettes, uncomplicated: Secondary | ICD-10-CM | POA: Diagnosis present

## 2023-08-06 DIAGNOSIS — N184 Chronic kidney disease, stage 4 (severe): Secondary | ICD-10-CM | POA: Diagnosis present

## 2023-08-06 DIAGNOSIS — Z716 Tobacco abuse counseling: Secondary | ICD-10-CM | POA: Diagnosis not present

## 2023-08-06 DIAGNOSIS — G894 Chronic pain syndrome: Secondary | ICD-10-CM | POA: Diagnosis present

## 2023-08-06 DIAGNOSIS — Z885 Allergy status to narcotic agent status: Secondary | ICD-10-CM | POA: Diagnosis not present

## 2023-08-06 DIAGNOSIS — D5700 Hb-SS disease with crisis, unspecified: Principal | ICD-10-CM | POA: Diagnosis present

## 2023-08-06 DIAGNOSIS — E875 Hyperkalemia: Secondary | ICD-10-CM | POA: Diagnosis present

## 2023-08-06 DIAGNOSIS — M47816 Spondylosis without myelopathy or radiculopathy, lumbar region: Secondary | ICD-10-CM | POA: Diagnosis present

## 2023-08-06 DIAGNOSIS — K219 Gastro-esophageal reflux disease without esophagitis: Secondary | ICD-10-CM | POA: Diagnosis present

## 2023-08-06 DIAGNOSIS — D72829 Elevated white blood cell count, unspecified: Secondary | ICD-10-CM | POA: Diagnosis present

## 2023-08-06 DIAGNOSIS — R296 Repeated falls: Secondary | ICD-10-CM | POA: Diagnosis present

## 2023-08-06 DIAGNOSIS — D631 Anemia in chronic kidney disease: Secondary | ICD-10-CM | POA: Diagnosis present

## 2023-08-06 DIAGNOSIS — F17200 Nicotine dependence, unspecified, uncomplicated: Secondary | ICD-10-CM | POA: Diagnosis present

## 2023-08-06 DIAGNOSIS — M48061 Spinal stenosis, lumbar region without neurogenic claudication: Secondary | ICD-10-CM | POA: Diagnosis present

## 2023-08-06 DIAGNOSIS — Z96651 Presence of right artificial knee joint: Secondary | ICD-10-CM | POA: Diagnosis present

## 2023-08-06 DIAGNOSIS — Y929 Unspecified place or not applicable: Secondary | ICD-10-CM

## 2023-08-06 DIAGNOSIS — M25561 Pain in right knee: Secondary | ICD-10-CM

## 2023-08-06 DIAGNOSIS — M25562 Pain in left knee: Secondary | ICD-10-CM

## 2023-08-06 DIAGNOSIS — Z886 Allergy status to analgesic agent status: Secondary | ICD-10-CM | POA: Diagnosis not present

## 2023-08-06 DIAGNOSIS — N189 Chronic kidney disease, unspecified: Secondary | ICD-10-CM

## 2023-08-06 DIAGNOSIS — D638 Anemia in other chronic diseases classified elsewhere: Secondary | ICD-10-CM | POA: Diagnosis present

## 2023-08-06 LAB — RAPID URINE DRUG SCREEN, HOSP PERFORMED
Amphetamines: NOT DETECTED
Barbiturates: NOT DETECTED
Benzodiazepines: NOT DETECTED
Cocaine: NOT DETECTED
Opiates: POSITIVE — AB
Tetrahydrocannabinol: NOT DETECTED

## 2023-08-06 LAB — CBC WITH DIFFERENTIAL/PLATELET
Abs Immature Granulocytes: 0.03 10*3/uL (ref 0.00–0.07)
Basophils Absolute: 0 10*3/uL (ref 0.0–0.1)
Basophils Relative: 0 %
Eosinophils Absolute: 0.1 10*3/uL (ref 0.0–0.5)
Eosinophils Relative: 1 %
HCT: 22.2 % — ABNORMAL LOW (ref 39.0–52.0)
Hemoglobin: 7.4 g/dL — ABNORMAL LOW (ref 13.0–17.0)
Immature Granulocytes: 0 %
Lymphocytes Relative: 56 %
Lymphs Abs: 3.9 10*3/uL (ref 0.7–4.0)
MCH: 37.6 pg — ABNORMAL HIGH (ref 26.0–34.0)
MCHC: 33.3 g/dL (ref 30.0–36.0)
MCV: 112.7 fL — ABNORMAL HIGH (ref 80.0–100.0)
Monocytes Absolute: 0.4 10*3/uL (ref 0.1–1.0)
Monocytes Relative: 5 %
Neutro Abs: 2.7 10*3/uL (ref 1.7–7.7)
Neutrophils Relative %: 38 %
Platelets: 225 10*3/uL (ref 150–400)
RBC: 1.97 MIL/uL — ABNORMAL LOW (ref 4.22–5.81)
WBC: 7.1 10*3/uL (ref 4.0–10.5)
nRBC: 2.3 % — ABNORMAL HIGH (ref 0.0–0.2)

## 2023-08-06 LAB — COMPREHENSIVE METABOLIC PANEL WITH GFR
ALT: 31 U/L (ref 0–44)
AST: 35 U/L (ref 15–41)
Albumin: 3.2 g/dL — ABNORMAL LOW (ref 3.5–5.0)
Alkaline Phosphatase: 218 U/L — ABNORMAL HIGH (ref 38–126)
Anion gap: 10 (ref 5–15)
BUN: 50 mg/dL — ABNORMAL HIGH (ref 6–20)
CO2: 23 mmol/L (ref 22–32)
Calcium: 8.7 mg/dL — ABNORMAL LOW (ref 8.9–10.3)
Chloride: 105 mmol/L (ref 98–111)
Creatinine, Ser: 3.9 mg/dL — ABNORMAL HIGH (ref 0.61–1.24)
GFR, Estimated: 19 mL/min — ABNORMAL LOW (ref >60)
Glucose, Bld: 93 mg/dL (ref 70–99)
Potassium: 4.6 mmol/L (ref 3.5–5.1)
Sodium: 138 mmol/L (ref 135–145)
Total Bilirubin: 1.4 mg/dL — ABNORMAL HIGH (ref 0.0–1.2)
Total Protein: 7.3 g/dL (ref 6.5–8.1)

## 2023-08-06 LAB — RETICULOCYTES
Immature Retic Fract: 28.9 % — ABNORMAL HIGH (ref 2.3–15.9)
RBC.: 1.97 MIL/uL — ABNORMAL LOW (ref 4.22–5.81)
Retic Count, Absolute: 134 10*3/uL (ref 19.0–186.0)
Retic Ct Pct: 6.8 % — ABNORMAL HIGH (ref 0.4–3.1)

## 2023-08-06 MED ORDER — AMITRIPTYLINE HCL 25 MG PO TABS
50.0000 mg | ORAL_TABLET | Freq: Every day | ORAL | Status: DC
  Administered 2023-08-06: 100 mg via ORAL
  Administered 2023-08-07 – 2023-08-08 (×2): 125 mg via ORAL
  Filled 2023-08-06 (×3): qty 5

## 2023-08-06 MED ORDER — OXYCODONE HCL ER 15 MG PO T12A
15.0000 mg | EXTENDED_RELEASE_TABLET | Freq: Two times a day (BID) | ORAL | Status: DC
  Administered 2023-08-06 – 2023-08-09 (×6): 15 mg via ORAL
  Filled 2023-08-06 (×6): qty 1

## 2023-08-06 MED ORDER — OXYCODONE HCL 5 MG PO TABS
10.0000 mg | ORAL_TABLET | Freq: Four times a day (QID) | ORAL | Status: DC | PRN
  Administered 2023-08-06 – 2023-08-08 (×3): 10 mg via ORAL
  Filled 2023-08-06 (×3): qty 2

## 2023-08-06 MED ORDER — SENNOSIDES-DOCUSATE SODIUM 8.6-50 MG PO TABS
1.0000 | ORAL_TABLET | Freq: Two times a day (BID) | ORAL | Status: DC
  Administered 2023-08-08: 1 via ORAL
  Filled 2023-08-06 (×3): qty 1

## 2023-08-06 MED ORDER — SODIUM ZIRCONIUM CYCLOSILICATE 10 G PO PACK
10.0000 g | PACK | Freq: Every day | ORAL | Status: DC
  Administered 2023-08-07 – 2023-08-09 (×2): 10 g via ORAL
  Filled 2023-08-06 (×3): qty 1

## 2023-08-06 MED ORDER — SODIUM BICARBONATE 650 MG PO TABS
650.0000 mg | ORAL_TABLET | Freq: Three times a day (TID) | ORAL | Status: DC
  Administered 2023-08-06 – 2023-08-07 (×3): 650 mg via ORAL
  Filled 2023-08-06 (×3): qty 1

## 2023-08-06 MED ORDER — NALOXONE HCL 0.4 MG/ML IJ SOLN: 0.4000 mg | INTRAMUSCULAR | Status: DC | PRN

## 2023-08-06 MED ORDER — DIPHENHYDRAMINE HCL 25 MG PO CAPS: 25.0000 mg | ORAL_CAPSULE | ORAL | Status: DC | PRN

## 2023-08-06 MED ORDER — CALCITRIOL 0.5 MCG PO CAPS
1.0000 ug | ORAL_CAPSULE | Freq: Every day | ORAL | Status: DC
  Administered 2023-08-07 – 2023-08-09 (×3): 1 ug via ORAL
  Filled 2023-08-06 (×3): qty 2

## 2023-08-06 MED ORDER — HYDROXYUREA 500 MG PO CAPS: 500.0000 mg | ORAL_CAPSULE | ORAL | Status: DC

## 2023-08-06 MED ORDER — FOLIC ACID 1 MG PO TABS
1.0000 mg | ORAL_TABLET | Freq: Every day | ORAL | Status: DC
  Administered 2023-08-07 – 2023-08-09 (×2): 1 mg via ORAL
  Filled 2023-08-06 (×2): qty 1

## 2023-08-06 MED ORDER — SODIUM CHLORIDE 0.45 % IV SOLN: INTRAVENOUS | Status: AC

## 2023-08-06 MED ORDER — DEFERIPRONE (TWICE DAILY) 1000 MG PO TABS: 4000.0000 mg | ORAL_TABLET | Freq: Every day | ORAL | Status: DC

## 2023-08-06 MED ORDER — POLYETHYLENE GLYCOL 3350 17 G PO PACK: 17.0000 g | PACK | Freq: Every day | ORAL | Status: DC | PRN

## 2023-08-06 MED ORDER — SODIUM CHLORIDE 0.9% FLUSH: 9.0000 mL | INTRAVENOUS | Status: DC | PRN

## 2023-08-06 MED ORDER — HYDROXYUREA 500 MG PO CAPS
1500.0000 mg | ORAL_CAPSULE | ORAL | Status: DC
  Administered 2023-08-07 – 2023-08-09 (×2): 1500 mg via ORAL
  Filled 2023-08-06 (×3): qty 3

## 2023-08-06 MED ORDER — HYDROMORPHONE HCL 1 MG/ML IJ SOLN
2.0000 mg | Freq: Once | INTRAMUSCULAR | Status: AC
  Administered 2023-08-06: 2 mg via INTRAVENOUS
  Filled 2023-08-06: qty 2

## 2023-08-06 MED ORDER — ACETAMINOPHEN 500 MG PO TABS
1000.0000 mg | ORAL_TABLET | Freq: Four times a day (QID) | ORAL | Status: DC | PRN
  Administered 2023-08-06 – 2023-08-09 (×2): 1000 mg via ORAL
  Filled 2023-08-06 (×2): qty 2

## 2023-08-06 MED ORDER — HYDROMORPHONE HCL 1 MG/ML IJ SOLN
1.0000 mg | Freq: Once | INTRAMUSCULAR | Status: AC
  Administered 2023-08-06: 1 mg via INTRAVENOUS
  Filled 2023-08-06: qty 1

## 2023-08-06 MED ORDER — ONDANSETRON HCL 4 MG/2ML IJ SOLN: 4.0000 mg | Freq: Four times a day (QID) | INTRAMUSCULAR | Status: DC | PRN

## 2023-08-06 MED ORDER — HYDROMORPHONE 1 MG/ML IV SOLN
INTRAVENOUS | Status: DC
  Administered 2023-08-06: 30 mg via INTRAVENOUS
  Administered 2023-08-07 (×2): 2 mg via INTRAVENOUS
  Administered 2023-08-07: 1 mg via INTRAVENOUS
  Administered 2023-08-07: 3.5 mg via INTRAVENOUS
  Administered 2023-08-08: 0.5 mg via INTRAVENOUS
  Administered 2023-08-08: 30 mg via INTRAVENOUS
  Administered 2023-08-08: 5 mg via INTRAVENOUS
  Administered 2023-08-08: 5.5 mg via INTRAVENOUS
  Administered 2023-08-09: 1 mg via INTRAVENOUS
  Administered 2023-08-09: 4 mg via INTRAVENOUS
  Filled 2023-08-06 (×2): qty 30

## 2023-08-06 MED ORDER — HYDROXYUREA 500 MG PO CAPS
500.0000 mg | ORAL_CAPSULE | ORAL | Status: DC
  Administered 2023-08-08: 2000 mg via ORAL
  Filled 2023-08-06: qty 1

## 2023-08-06 NOTE — ED Triage Notes (Signed)
 Here by POV from home for fallx2 yesterday and bilateral knee pain and swelling. Was here yesterday for SCC. K was high, given Lokelma  and d/c'd, felt better initially, but then felt jittery which caused the falls. Here for fall and knee pain, not SCC. Concerned about K level. Rates pain 10/10. Alert, NAD, calm, interactive.

## 2023-08-06 NOTE — H&P (Cosign Needed)
 H&P  Patient Demographics:  [name] [name], is a 43 y.o. male  MRN: [id number]   DOB - 1980-03-09  Admit Date - 08/06/2023  Outpatient Primary MD for the patient is [name], NP  Chief Complaint  Patient presents with   Fall      HPI:   [name] [name]  is a 43 y.o. male with history of sickle cell disease. Chronic pain syndrome, opiate dependence and tolerance, anemia of chronic disease, and CKD stage IV who presented to the day emergency department with major complaints of generalized body pain but primarily in his lower extremities and back, rates pain  at 9/10. He describes this pain as constant and achy and throbbing. He reports frequent falls, dizziness, light headedness and overall jittering while driving.  He took his home medications with no sustained relief. He denies any trauma from his recent falls, fever, headache, cough, blurry vision, chest pain, shortness of breath, nausea, vomiting or diarrhea. No urinary symptoms. He denies any recent travels, sick contacts    ED Course: Patient treated with IV fluids, IV pain medications with no resolution to pain. HGB below baseline, patient admitted for ongoing sickle cell pain management. BP 116/81  Pulse 94  Temp (!) 97.5 F (36.4 C) (Oral)  Resp 19  Wt 59 kg  SpO2 99%  BMI 16.25 kg/m  Labs Reviewed  CBC WITH DIFFERENTIAL/PLATELET - Abnormal; Notable for the following components:      Result Value   RBC 1.97 (*)    Hemoglobin 7.4 (*)    HCT 22.2 (*)    MCV 112.7 (*)    MCH 37.6 (*)    nRBC 2.3 (*)    All other components within normal limits  COMPREHENSIVE METABOLIC PANEL WITH GFR - Abnormal; Notable for the following components:   BUN 50 (*)    Creatinine, Ser 3.90 (*)    Calcium  8.7 (*)    Albumin  3.2 (*)    Alkaline Phosphatase 218 (*)    Total Bilirubin 1.4 (*)    GFR, Estimated 19 (*)    All other components within normal limits  RETICULOCYTES - Abnormal; Notable for the following components:    Retic Ct Pct 6.8 (*)    RBC. 1.97 (*)    Immature Retic Fract 28.9 (*)    All other components within normal limits  RAPID URINE DRUG SCREEN, HOSP PERFORMED - Abnormal; Notable for the following components:   Opiates POSITIVE (*)    All other components within normal limits  CBC  COMPREHENSIVE METABOLIC PANEL WITH GFR     Review of systems:  In addition to the HPI above, patient reports No fever or chills No Headache, No changes with vision or hearing No problems swallowing food or liquids No chest pain, cough or shortness of breath No abdominal pain, No nausea or vomiting, Bowel movements are regular No blood in stool or urine No dysuria No new skin rashes or bruises No new joints pains-aches No new weakness, tingling, numbness in any extremity No recent weight gain or loss No polyuria, polydypsia or polyphagia No significant Mental Stressors  A full 10 point Review of Systems was done, except as stated above, all other Review of Systems were negative.  With Past History of the following :   Past Medical History:  Diagnosis Date   Abscess of right iliac fossa 09/24/2018   required Perc Drain 09/24/2018   Arachnoid Cyst of brain bilaterally    2 really small ones in the  back of my head; inside; saw them w/MRI (09/25/2012)   Bacterial pneumonia ~ 2012   caught it here in the hospital (09/25/2012)   Chronic kidney disease    from my sickle cell (09/25/2012)   CKD (chronic kidney disease), stage II    Colitis 04/19/2017   CT scan abd/ pelvis   GERD (gastroesophageal reflux disease)    after I eat alot of spicey foods (09/25/2012)   Gynecomastia, male 07/10/2012   History of blood transfusion    always related to sickle cell crisis (09/25/2012)   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   Noted in noted from Hematology notes at [facility]   Migraines    take RX qd to prevent them (09/25/2012)   Opioid dependence with withdrawal (HCC) 08/30/2012   Renal  insufficiency    Sickle cell anemia (HCC)    Sickle cell crisis (HCC) 09/25/2012   Sickle cell nephropathy (HCC) 07/10/2012   Sinus tachycardia    Tachycardia with heart rate 121-140 beats per minute with ambulation 08/04/2016      Past Surgical History:  Procedure Laterality Date   ABCESS DRAINAGE     CHOLECYSTECTOMY  ~ 2012   COLONOSCOPY N/A 04/23/2017   Procedure: COLONOSCOPY;  Surgeon: [name], MD;  Location: [facility];  Service: Endoscopy;  Laterality: N/A;   IR FLUORO GUIDE CV LINE RIGHT  12/17/2016   IR FLUORO GUIDE CV LINE RIGHT  02/07/2021   IR RADIOLOGIST EVAL & MGMT  10/02/2018   IR RADIOLOGIST EVAL & MGMT  10/15/2018   IR REMOVAL TUN CV CATH W/O FL  12/21/2016   IR REMOVAL TUN CV CATH W/O FL  02/11/2021   IR US  GUIDE VASC ACCESS RIGHT  12/17/2016   IR US  GUIDE VASC ACCESS RIGHT  02/07/2021   spleenectomy       Social History:   Social History   Tobacco Use   Smoking status: Former    Current packs/day: 0.10    Types: Cigarettes   Smokeless tobacco: Never  Substance Use Topics   Alcohol  use: Yes    Comment: Once a month     Lives - At home   Family History :   Family History  Problem Relation Age of Onset   Breast cancer Mother      Home Medications:   Prior to Admission medications   Medication Sig Start Date End Date Taking? Authorizing Provider  amitriptyline  (ELAVIL ) 25 MG tablet TAKE 2 TABLETS(50 MG) BY MOUTH AT BEDTIME Patient taking differently: Take 50-125 mg by mouth at bedtime. 05/24/23   [name], NP  calcitRIOL  (ROCALTROL ) 0.25 MCG capsule Take 4 capsules by mouth daily with breakfast. 11/16/20   [provider]  Deferiprone , Twice Daily, (FERRIPROX  TWICE-A-DAY) 1000 MG TABS Take 4,000 mg by mouth daily. 05/01/22   Tilford Bertram HERO, FNP  folic acid  (FOLVITE ) 1 MG tablet TAKE 1 TABLET BY MOUTH EVERY DAY 03/22/20   Hollis, Lachina M, FNP  hydroxyurea  (HYDREA ) 500 MG capsule Take 1 capsule by mouth in the morning on  Sun/Tues/Wed/Thurs/Sat and 2 capsules on Mon/Fri Patient taking differently: Take 500-1,000 mg by mouth See admin instructions. Take 1500 mg by mouth on Tuesday and Thursday. Take 500 mg by mouth on Sunday, Monday, Wednesday, Friday, and Saturday 01/25/23   Nichols, [name], NP  oxyCODONE  (OXYCONTIN ) 15 mg 12 hr tablet Take 1 tablet (15 mg) by mouth every 12 hours. 07/24/23   Cherylene Homer HERO, NP  Oxycodone  HCl 10 MG TABS  Take 1 tablet (10 mg total) by mouth See admin instructions. Take 10 mg by mouth every 6 hours 07/16/23   [name], [name], MD  sodium bicarbonate  650 MG tablet Take 650 mg by mouth with breakfast, with lunch, and with evening meal. 11/07/18   [provider]  sodium zirconium cyclosilicate  (LOKELMA ) 10 g PACK packet Take 10 g by mouth daily. 04/21/23 04/22/24  [provider]  TYLENOL  8 HOUR ARTHRITIS PAIN 650 MG CR tablet Take 1,300 mg by mouth as needed for pain.    [provider]     Allergies:   Allergies  Allergen Reactions   Nsaids Other (See Comments)    Kidney disease   Morphine  And Codeine  Other (See Comments)    Really bad headaches      Physical Exam:   Vitals:   Vitals:   08/07/23 1054 08/07/23 1213  BP: 105/76   Pulse: 98   Resp: 17 18  Temp: 98 F (36.7 C)   SpO2: 97%     Physical Exam: Constitutional: Patient appears well-developed and well-nourished. Not in obvious distress. HENT: Normocephalic, atraumatic, External right and left ear normal. Oropharynx is clear and moist.  Eyes: Conjunctivae and EOM are normal. PERRLA, no scleral icterus. Neck: Normal ROM. Neck supple. No JVD. No tracheal deviation. No thyromegaly. CVS: RRR, S1/S2 +, no murmurs, no gallops, no carotid bruit.  Pulmonary: Effort and breath sounds normal, no stridor, rhonchi, wheezes, rales.  Abdominal: Soft. BS +, no distension, tenderness, rebound or guarding.  Musculoskeletal: generalize body tenderness Lymphadenopathy: No lymphadenopathy noted,  cervical, inguinal or axillary Neuro: Alert. Normal reflexes, muscle tone coordination. No cranial nerve deficit. Skin: Skin is warm and dry. No rash noted. Not diaphoretic. No erythema. No pallor. Psychiatric: Normal mood and affect. Behavior, judgment, thought content normal.   Data Review:   CBC Recent Labs  Lab 08/02/23 1018 08/05/23 0751 08/06/23 0813  WBC 6.8 10.0 7.1  HGB 7.2* 7.9* 7.4*  HCT 21.6* 23.7* 22.2*  PLT 262 229 225  MCV 111* 114.5* 112.7*  MCH 37.1* 38.2* 37.6*  MCHC 33.3 33.3 33.3  RDW  --  Not Measured Not Measured  LYMPHSABS 4.2* 7.4* 3.9  MONOABS  --  0.2 0.4  EOSABS 0.0 0.1 0.1  BASOSABS 0.0 0.0 0.0   ------------------------------------------------------------------------------------------------------------------  Chemistries  Recent Labs  Lab 08/05/23 0751 08/05/23 1157 08/06/23 0813  NA 136 138 138  K 5.5* 5.9* 4.6  CL 110 119* 105  CO2 17* 15* 23  GLUCOSE 98 75 93  BUN 57* 47* 50*  CREATININE 4.20* 3.14* 3.90*  CALCIUM  8.7* 6.9* 8.7*  AST 42*  --  35  ALT 37  --  31  ALKPHOS 226*  --  218*  BILITOT 0.9  --  1.4*   ------------------------------------------------------------------------------------------------------------------ estimated creatinine clearance is 20.3 mL/min (A) (by C-G formula based on SCr of 3.9 mg/dL (H)). ------------------------------------------------------------------------------------------------------------------ No results for input(s): TSH, T4TOTAL, T3FREE, THYROIDAB in the last 72 hours.  Invalid input(s): FREET3  Coagulation profile No results for input(s): INR, PROTIME in the last 168 hours. ------------------------------------------------------------------------------------------------------------------- No results for input(s): DDIMER in the last 72 hours. -------------------------------------------------------------------------------------------------------------------  Cardiac  Enzymes No results for input(s): CKMB, TROPONINI, MYOGLOBIN in the last 168 hours.  Invalid input(s): CK ------------------------------------------------------------------------------------------------------------------ No results found for: BNP  ---------------------------------------------------------------------------------------------------------------  Urinalysis    Component Value Date/Time   COLORURINE YELLOW 04/03/2022 1011   APPEARANCEUR CLEAR 04/03/2022 1011   LABSPEC 1.008 04/03/2022 1011   PHURINE 8.0  04/03/2022 1011   GLUCOSEU 50 (A) 04/03/2022 1011   HGBUR NEGATIVE 04/03/2022 1011   HGBUR small 03/10/2010 1445   BILIRUBINUR NEGATIVE 04/03/2022 1011   BILIRUBINUR negative 04/19/2021 1217   BILIRUBINUR neg 09/30/2020 1117   KETONESUR NEGATIVE 04/03/2022 1011   PROTEINUR NEGATIVE 04/03/2022 1011   UROBILINOGEN 0.2 04/19/2021 1217   UROBILINOGEN 0.2 05/11/2017 1335   NITRITE NEGATIVE 04/03/2022 1011   LEUKOCYTESUR NEGATIVE 04/03/2022 1011    ----------------------------------------------------------------------------------------------------------------   Imaging Results:    CT Knee Left Wo Contrast Result Date: 08/06/2023 CLINICAL DATA:  Bilateral knee trauma, fall EXAM: CT OF THE LEFT KNEE WITHOUT CONTRAST TECHNIQUE: Multidetector CT imaging of the left knee was performed according to the standard protocol. Multiplanar CT image reconstructions were also generated. RADIATION DOSE REDUCTION: This exam was performed according to the departmental dose-optimization program which includes automated exposure control, adjustment of the mA and/or kV according to patient size and/or use of iterative reconstruction technique. COMPARISON:  Radiographs 08/06/2023 FINDINGS: Bones/Joint/Cartilage Moderate knee effusion. 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 This leads to a scalloped mildly depressed superior surface of the medial tibial plateau. Sclerosis in the patella likewise probably the residua from remote bone infarct. Nitrogen gas phenomenon in the medial compartment. Ligaments Suboptimally assessed by CT. Muscles and Tendons Unremarkable Soft tissues Unremarkable IMPRESSION: 1. 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 This leads to a scalloped mildly depressed superior surface of the medial tibial plateau. 2. Moderate knee effusion. 3. Sclerosis in the patella likewise probably the residua from remote bone infarct. Electronically Signed   By: Ryan Salvage M.D.   On: 08/06/2023 13:00   CT Knee Right Wo Contrast Result Date: 08/06/2023 CLINICAL DATA:  Fall, bilateral knee pain and swelling. EXAM: CT OF THE RIGHT KNEE WITHOUT CONTRAST TECHNIQUE: Multidetector CT imaging of the right knee was performed according to the standard protocol. Multiplanar CT image reconstructions were also generated. RADIATION DOSE REDUCTION: This exam was performed according to the departmental dose-optimization program which includes automated exposure control, adjustment of the mA and/or kV according to patient size and/or use of iterative reconstruction technique. COMPARISON:  Radiographs 08/06/2023 FINDINGS: Bones/Joint/Cartilage Total knee prosthesis observed. No periprosthetic fracture is identified. Bony demineralization observed. Thin rim sclerotic lucent lesion in the proximal fibular head appears chronic and likely incidental. Resurfaced patella noted. Moderate knee joint effusion. Substantial synovitis along the margins of the suprapatellar bursa. Ligaments Suboptimally assessed by CT. Muscles  and Tendons Thickened distal quadriceps tendon and patellar tendon. Soft tissues Unremarkable IMPRESSION: 1. Total knee prosthesis without periprosthetic fracture identified. 2. Moderate knee joint effusion with substantial synovitis along the margins of the suprapatellar bursa. 3. Thickened distal quadriceps tendon and patellar tendon. 4. Bony demineralization. Electronically Signed   By: Ryan Salvage M.D.   On: 08/06/2023 12:56   DG Knee Complete 4 Views Left Result Date: 08/06/2023 CLINICAL DATA:  fall EXAM: LEFT KNEE - COMPLETE 4+ VIEW COMPARISON:  12/26/2021 FINDINGS: Old medial tibial plateau fracture deformity with underlying extensive sclerosis, stable. No acute fracture or dislocation. There may be a small suprapatellar effusion. Regional soft tissues unremarkable. IMPRESSION: 1. No acute fracture or other acute  findings. 2. Possible small suprapatellar effusion. 3. Chronic medial tibial plateau fracture deformity. Electronically Signed   By: JONETTA Faes M.D.   On: 08/06/2023 08:43   DG Knee Complete 4 Views Right Result Date: 08/06/2023 CLINICAL DATA:  fall EXAM: RIGHT KNEE - COMPLETE 4+ VIEW COMPARISON:  12/26/2021 FINDINGS: Interval knee arthroplasty, cemented components in expected location. No fracture or dislocation. Small suprapatellar joint effusion. Regional soft tissues unremarkable. IMPRESSION: 1. No fracture or other acute findings. 2. Small joint effusion. Electronically Signed   By: JONETTA Faes M.D.   On: 08/06/2023 08:41     Assessment & Plan:  Principal Problem:   Sickle cell anemia with pain (HCC) Active Problems:   TOBACCO ABUSE   CKD (chronic kidney disease), stage IV (HCC)   Anemia of chronic disease   Chronic pain syndrome   Fall   Falls: Neuro consult  Hb Sickle Cell Disease with crisis: Admit patient, start IVF 0.45% Saline @ 125 mls/hour, start weight based Dilaudid  PCA, Toradol  is contraindicated for patient due to history of CKD. Restart oral home pain  medications, Monitor vitals very closely, Re-evaluate pain scale regularly, 2 L of Oxygen  by [place], Patient will be re-evaluated for pain in the context of function and relationship to baseline as care progresses. Leukocytosis: WBC stable.   Anemia of Chronic Disease: hemoglobin within patients baseline. Will continue to monitor daily cbc. Chronic pain Syndrome: continue oral home medication Tobacco Abuse: Smoking cessation counseling provided  DVT Prophylaxis: Subcut Lovenox    AM Labs Ordered, also please review Full Orders  Family Communication: Admission, patient's condition and plan of care including tests being ordered have been discussed with the patient who indicate understanding and agree with the plan and Code Status.  Code Status: Full Code  Consults called: None    Admission status: Inpatient    Time spent in minutes : 50 minutes  Homer CHRISTELLA Cover NP 08/07/2023 at 2:36 PM

## 2023-08-06 NOTE — ED Provider Notes (Signed)
 [place] EMERGENCY DEPARTMENT AT [facility] Provider Note   CSN: [id number] Arrival date & time: 08/06/23  9342     Patient presents with: Fall   [name] [name]. is a 43 y.o. male.   HPI     43yo male with a history of sickle cell anemia, immune complex glomerulonephritis, CKD, evaluation yesterday with SCC pain, worsening CKD, who presents with concern for fall.  Reports that he left the emergency department yesterday after he was treated for his sickle cell pain feeling that his pain was improved.  He received Lokelma  and fluid while he was in the emergency department.  He also received insulin  and dextrose .  Plan was for close outpatient follow-up with his nephrologist at [facility]. reports that he was feeling hot driving in the car in the heat and went to the grocery store and began to feel a little bit lightheaded and jumpy on the inside or jittery and reports that because he was feeling this way he fell.  Reports he fell down onto his knees bilaterally caught himself with his hands.  He he denies any head trauma, loss of consciousness.  He reports he had 2 falls yesterday.  He has not had focal numbness or weakness.  Denies fever, chest pain, shortness of breath, cough.  He has pain that is 10 out of 10 in his bilateral knees, but denies any other sickle cell pain.  Reports the pain feels like it is from the fall and not his sickle cell at this time.    Past Medical History:  Diagnosis Date   Abscess of right iliac fossa 09/24/2018   required Perc Drain 09/24/2018   Arachnoid Cyst of brain bilaterally    2 really small ones in the back of my head; inside; saw them w/MRI (09/25/2012)   Bacterial pneumonia ~ 2012   caught it here in the hospital (09/25/2012)   Chronic kidney disease    from my sickle cell (09/25/2012)   CKD (chronic kidney disease), stage II    Colitis 04/19/2017   CT scan abd/ pelvis   GERD (gastroesophageal reflux disease)    after I eat  alot of spicey foods (09/25/2012)   Gynecomastia, male 07/10/2012   History of blood transfusion    always related to sickle cell crisis (09/25/2012)   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   Noted in noted from Hematology notes at [facility]   Migraines    take RX qd to prevent them (09/25/2012)   Opioid dependence with withdrawal (HCC) 08/30/2012   Renal insufficiency    Sickle cell anemia (HCC)    Sickle cell crisis (HCC) 09/25/2012   Sickle cell nephropathy (HCC) 07/10/2012   Sinus tachycardia    Tachycardia with heart rate 121-140 beats per minute with ambulation 08/04/2016     Prior to Admission medications   Medication Sig Start Date End Date Taking? Authorizing Provider  amitriptyline  (ELAVIL ) 25 MG tablet TAKE 2 TABLETS(50 MG) BY MOUTH AT BEDTIME Patient taking differently: Take 50-125 mg by mouth at bedtime. 05/24/23   [name], NP  calcitRIOL  (ROCALTROL ) 0.25 MCG capsule Take 4 capsules by mouth daily with breakfast. 11/16/20   [provider]  Deferiprone , Twice Daily, (FERRIPROX  TWICE-A-DAY) 1000 MG TABS Take 4,000 mg by mouth daily. 05/01/22   Tilford Bertram HERO, FNP  folic acid  (FOLVITE ) 1 MG tablet TAKE 1 TABLET BY MOUTH EVERY DAY 03/22/20   Hollis, Lachina M, FNP  hydroxyurea  (HYDREA ) 500 MG capsule Take  1 capsule by mouth in the morning on Sun/Tues/Wed/Thurs/Sat and 2 capsules on Mon/Fri Patient taking differently: Take 500-1,000 mg by mouth See admin instructions. Take 1500 mg by mouth on Tuesday and Thursday. Take 500 mg by mouth on Sunday, Monday, Wednesday, Friday, and Saturday 01/25/23   Nichols, [name], NP  oxyCODONE  (OXYCONTIN ) 15 mg 12 hr tablet Take 1 tablet (15 mg) by mouth every 12 hours. 07/24/23   Ijaola, [name] M, NP  Oxycodone  HCl 10 MG TABS Take 1 tablet (10 mg total) by mouth See admin instructions. Take 10 mg by mouth every 6 hours 07/16/23   [name], [name], MD  sodium bicarbonate  650 MG tablet Take 650 mg by mouth with  breakfast, with lunch, and with evening meal. 11/07/18   [provider]  sodium zirconium cyclosilicate  (LOKELMA ) 10 g PACK packet Take 10 g by mouth daily. 04/21/23 04/22/24  [provider]  TYLENOL  8 HOUR ARTHRITIS PAIN 650 MG CR tablet Take 1,300 mg by mouth as needed for pain.    [provider]    Allergies: Nsaids and Morphine  and codeine     Review of Systems  Updated Vital Signs BP 122/84   Pulse 93   Temp 98 F (36.7 C) (Oral)   Resp 17   Ht 5' 3 (1.6 m)   Wt 59 kg   SpO2 98%   BMI 23.04 kg/m   Physical Exam Vitals and nursing note reviewed.  Constitutional:      General: He is not in acute distress.    Appearance: He is well-developed. He is not diaphoretic.  HENT:     Head: Normocephalic and atraumatic.   Eyes:     Conjunctiva/sclera: Conjunctivae normal.    Cardiovascular:     Rate and Rhythm: Normal rate and regular rhythm.     Heart sounds: Normal heart sounds. No murmur heard.    No friction rub. No gallop.  Pulmonary:     Effort: Pulmonary effort is normal. No respiratory distress.     Breath sounds: Normal breath sounds. No wheezing or rales.  Abdominal:     General: There is no distension.     Palpations: Abdomen is soft.     Tenderness: There is no abdominal tenderness. There is no guarding.   Musculoskeletal:        General: Swelling (bilateral knees) and tenderness (bilateral knees) present.     Cervical back: Normal range of motion.     Comments: Pain with flexion and extension, able to flex and extend knees bilaterally.  No erythema Normal pulses    Skin:    General: Skin is warm and dry.   Neurological:     Mental Status: He is alert and oriented to person, place, and time.        (all labs ordered are listed, but only abnormal results are displayed) Labs Reviewed  CBC WITH DIFFERENTIAL/PLATELET - Abnormal; Notable for the following components:      Result Value   RBC 1.97 (*)    Hemoglobin 7.4 (*)     HCT 22.2 (*)    MCV 112.7 (*)    MCH 37.6 (*)    nRBC 2.3 (*)    All other components within normal limits  COMPREHENSIVE METABOLIC PANEL WITH GFR - Abnormal; Notable for the following components:   BUN 50 (*)    Creatinine, Ser 3.90 (*)    Calcium  8.7 (*)    Albumin  3.2 (*)    Alkaline Phosphatase 218 (*)  Total Bilirubin 1.4 (*)    GFR, Estimated 19 (*)    All other components within normal limits  RETICULOCYTES - Abnormal; Notable for the following components:   Retic Ct Pct 6.8 (*)    RBC. 1.97 (*)    Immature Retic Fract 28.9 (*)    All other components within normal limits  RAPID URINE DRUG SCREEN, HOSP PERFORMED - Abnormal; Notable for the following components:   Opiates POSITIVE (*)    All other components within normal limits  CBC    EKG: EKG Interpretation Date/Time:  Monday August 06 2023 07:54:34 EDT Ventricular Rate:  99 PR Interval:  185 QRS Duration:  86 QT Interval:  329 QTC Calculation: 423 R Axis:   40  Text Interpretation: Sinus rhythm Probable inferior infarct, old No significant change since last tracing Confirmed by Dreama Longs ([id number]) on 08/06/2023 9:34:30 AM  Radiology: CT Knee Left Wo Contrast Result Date: 08/06/2023 CLINICAL DATA:  Bilateral knee trauma, fall EXAM: CT OF THE LEFT KNEE WITHOUT CONTRAST TECHNIQUE: Multidetector CT imaging of the left knee was performed according to the standard protocol. Multiplanar CT image reconstructions were also generated. RADIATION DOSE REDUCTION: This exam was performed according to the departmental dose-optimization program which includes automated exposure control, adjustment of the mA and/or kV according to patient size and/or use of iterative reconstruction technique. COMPARISON:  Radiographs 08/06/2023 FINDINGS: Bones/Joint/Cartilage Moderate knee effusion. 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 This leads to a scalloped mildly depressed superior surface of the medial tibial plateau. Sclerosis in the patella likewise probably the residua from remote bone infarct. Nitrogen gas phenomenon in the medial compartment. Ligaments Suboptimally assessed by CT. Muscles and Tendons Unremarkable Soft tissues Unremarkable IMPRESSION: 1. 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 This leads to a scalloped mildly depressed superior surface of the medial tibial plateau. 2. Moderate knee effusion. 3. Sclerosis in the patella likewise probably the residua from remote bone infarct. Electronically Signed   By: Ryan Salvage M.D.   On: 08/06/2023 13:00   CT Knee Right Wo Contrast Result Date: 08/06/2023 CLINICAL DATA:  Fall, bilateral knee pain and swelling. EXAM: CT OF THE RIGHT KNEE WITHOUT CONTRAST TECHNIQUE: Multidetector CT imaging of the right knee was performed according to the standard protocol. Multiplanar CT image reconstructions were also generated. RADIATION DOSE REDUCTION: This exam was performed according to the departmental dose-optimization program which includes automated exposure control, adjustment of the mA and/or kV according to patient size and/or use of iterative reconstruction technique. COMPARISON:  Radiographs 08/06/2023 FINDINGS: Bones/Joint/Cartilage Total knee prosthesis observed. No periprosthetic fracture is identified. Bony demineralization observed. Thin rim sclerotic lucent lesion in the proximal fibular head appears chronic and likely incidental. Resurfaced patella noted. Moderate knee joint effusion. Substantial synovitis along the margins of the suprapatellar bursa. Ligaments Suboptimally assessed by CT.  Muscles and Tendons Thickened distal quadriceps tendon and patellar tendon. Soft tissues Unremarkable IMPRESSION: 1. Total knee prosthesis without periprosthetic fracture identified. 2. Moderate knee joint effusion with substantial synovitis along the margins of the suprapatellar bursa. 3. Thickened distal quadriceps tendon and patellar tendon. 4. Bony demineralization. Electronically Signed   By: Ryan Salvage  M.D.   On: 08/06/2023 12:56   DG Knee Complete 4 Views Left Result Date: 08/06/2023 CLINICAL DATA:  fall EXAM: LEFT KNEE - COMPLETE 4+ VIEW COMPARISON:  12/26/2021 FINDINGS: Old medial tibial plateau fracture deformity with underlying extensive sclerosis, stable. No acute fracture or dislocation. There may be a small suprapatellar effusion. Regional soft tissues unremarkable. IMPRESSION: 1. No acute fracture or other acute findings. 2. Possible small suprapatellar effusion. 3. Chronic medial tibial plateau fracture deformity. Electronically Signed   By: JONETTA Faes M.D.   On: 08/06/2023 08:43   DG Knee Complete 4 Views Right Result Date: 08/06/2023 CLINICAL DATA:  fall EXAM: RIGHT KNEE - COMPLETE 4+ VIEW COMPARISON:  12/26/2021 FINDINGS: Interval knee arthroplasty, cemented components in expected location. No fracture or dislocation. Small suprapatellar joint effusion. Regional soft tissues unremarkable. IMPRESSION: 1. No fracture or other acute findings. 2. Small joint effusion. Electronically Signed   By: JONETTA Faes M.D.   On: 08/06/2023 08:41     Procedures   Medications Ordered in the ED  0.45 % sodium chloride  infusion ( Intravenous New Bag/Given 08/06/23 1745)  sodium bicarbonate  tablet 650 mg (650 mg Oral Given 08/06/23 1802)  folic acid  (FOLVITE ) tablet 1 mg (has no administration in time range)  calcitRIOL  (ROCALTROL ) capsule 1 mcg (has no administration in time range)  Deferiprone  (Twice Daily) TABS 4,000 mg (has no administration in time range)  sodium zirconium cyclosilicate   (LOKELMA ) packet 10 g (has no administration in time range)  amitriptyline  (ELAVIL ) tablet 50-125 mg (has no administration in time range)  oxyCODONE  (Oxy IR/ROXICODONE ) immediate release tablet 10 mg (10 mg Oral Given 08/06/23 1741)  oxyCODONE  (OXYCONTIN ) 12 hr tablet 15 mg (has no administration in time range)  acetaminophen  (TYLENOL ) tablet 1,000 mg (1,000 mg Oral Given 08/06/23 1741)  senna-docusate (Senokot-S) tablet 1 tablet (has no administration in time range)  polyethylene glycol (MIRALAX  / GLYCOLAX ) packet 17 g (has no administration in time range)  naloxone  (NARCAN ) injection 0.4 mg (has no administration in time range)    And  sodium chloride  flush (NS) 0.9 % injection 9 mL (has no administration in time range)  ondansetron  (ZOFRAN ) injection 4 mg (has no administration in time range)  diphenhydrAMINE  (BENADRYL ) capsule 25 mg (has no administration in time range)  HYDROmorphone  (DILAUDID ) 1 mg/mL PCA injection (30 mg Intravenous Set-up / Initial Syringe 08/06/23 1842)  hydroxyurea  (HYDREA ) capsule 1,500 mg (has no administration in time range)    And  hydroxyurea  (HYDREA ) capsule 500 mg (has no administration in time range)  HYDROmorphone  (DILAUDID ) injection 1 mg (1 mg Intravenous Given 08/06/23 0817)  HYDROmorphone  (DILAUDID ) injection 2 mg (2 mg Intravenous Given 08/06/23 1037)  HYDROmorphone  (DILAUDID ) injection 2 mg (2 mg Intravenous Given 08/06/23 1403)                                       42yo male with a history of sickle cell anemia, immune complex glomerulonephritis, CKD, evaluation yesterday with SCC pain, worsening CKD, who presents with concern for fall.  EKG completed as above is not present in MUSE --no sign of acute ST changes or changes consistent with hyperkalemia.  Labs completed and personally evaluated interpreted by me shows hemoglobin 7.4, stable from prior, potassium 4.6, decreased from yesterday's values.  Creatinine is 3.9, slightly increased from June  5 when it was 3.6, and increased from May 27 when it was 2.9.  He has had steady increase in his creatinine and is being followed by [facility] nephrology.  At this time do not see indication for admission for acute kidney injury. Possible lightheadedness yesterday in setting of heat/dehydration.  Has pain to the bilateral knees.  X-rays of the knees completed showing on the left old medial tibial plateau fracture deformity with underlying extensive sclerosis which is stable from prior without evidence of acute fracture or dislocation.  Right knee with no acute fracture or findings, small joint effusion, knee arthroplasty with components in expected location..  Do not suspect septic arthritis in absence of fever, and in presence of traumatic injury. Possible pain from contusion of fall, also consider meniscal or ligamentous injury.  Will recommend follow-up with his orthopedic physician Dr. Viann at [facility].  On reevaluation he reports his pain is too severe for him to ambulate.  He is requesting IV pain medications and admission.  Given his severe pain, will order CT of his bilateral knees to look for occult fracture.  Discussed possibility that his symptoms may also be secondary to sickle cell pain.  CT of right knee with moderate joint effusion, synovitis along suprapatellar bursa--suspect traumatic synovitis or bursitis, for this injury--recommend weight bearing as tolerated (reports has walker at home, crutches) and Orthopedic follow up.  CT left knee shows chronic infarcts of femoral condyles and tibial plateau medially and laterally with collapse collapse along medial tibial plateau infarct -moderate nee effusion, scerosis likely residua from remote bone infarct--chronic appear changes without signs of acute fracture and recommend WBAT, supportive care.   Reports pain is too severe to ambulate, possible sickle cell pain or pain heightened in setting of chronic pain.  Will admit for pain control,  observation.       Final diagnoses:  Fall, initial encounter  Acute pain of both knees  Sickle cell anemia with pain (HCC)  Chronic kidney disease, unspecified CKD stage    ED Discharge Orders     None          Dreama Longs, MD 08/06/23 2037

## 2023-08-06 NOTE — ED Notes (Signed)
 Pt will not remain still to perform bp.

## 2023-08-07 ENCOUNTER — Inpatient Hospital Stay (HOSPITAL_COMMUNITY)

## 2023-08-07 DIAGNOSIS — Z8673 Personal history of transient ischemic attack (TIA), and cerebral infarction without residual deficits: Secondary | ICD-10-CM | POA: Diagnosis not present

## 2023-08-07 DIAGNOSIS — F1120 Opioid dependence, uncomplicated: Secondary | ICD-10-CM | POA: Diagnosis not present

## 2023-08-07 DIAGNOSIS — W19XXXA Unspecified fall, initial encounter: Secondary | ICD-10-CM

## 2023-08-07 LAB — COMPREHENSIVE METABOLIC PANEL WITH GFR
ALT: 24 U/L (ref 0–44)
AST: 30 U/L (ref 15–41)
Albumin: 2.7 g/dL — ABNORMAL LOW (ref 3.5–5.0)
Alkaline Phosphatase: 193 U/L — ABNORMAL HIGH (ref 38–126)
Anion gap: 6 (ref 5–15)
BUN: 43 mg/dL — ABNORMAL HIGH (ref 6–20)
CO2: 22 mmol/L (ref 22–32)
Calcium: 8.6 mg/dL — ABNORMAL LOW (ref 8.9–10.3)
Chloride: 109 mmol/L (ref 98–111)
Creatinine, Ser: 3.35 mg/dL — ABNORMAL HIGH (ref 0.61–1.24)
GFR, Estimated: 23 mL/min — ABNORMAL LOW (ref >60)
Glucose, Bld: 97 mg/dL (ref 70–99)
Potassium: 5.7 mmol/L — ABNORMAL HIGH (ref 3.5–5.1)
Sodium: 137 mmol/L (ref 135–145)
Total Bilirubin: 1 mg/dL (ref 0.0–1.2)
Total Protein: 6.9 g/dL (ref 6.5–8.1)

## 2023-08-07 LAB — CBC
HCT: 20.1 % — ABNORMAL LOW (ref 39.0–52.0)
Hemoglobin: 7.2 g/dL — ABNORMAL LOW (ref 13.0–17.0)
MCH: 39.8 pg — ABNORMAL HIGH (ref 26.0–34.0)
MCHC: 35.8 g/dL (ref 30.0–36.0)
MCV: 111 fL — ABNORMAL HIGH (ref 80.0–100.0)
Platelets: 223 10*3/uL (ref 150–400)
RBC: 1.81 MIL/uL — ABNORMAL LOW (ref 4.22–5.81)
WBC: 9.8 10*3/uL (ref 4.0–10.5)
nRBC: 1.2 % — ABNORMAL HIGH (ref 0.0–0.2)

## 2023-08-07 MED ORDER — SODIUM BICARBONATE 650 MG PO TABS
1950.0000 mg | ORAL_TABLET | Freq: Three times a day (TID) | ORAL | Status: DC
  Administered 2023-08-07 – 2023-08-09 (×5): 1950 mg via ORAL
  Filled 2023-08-07 (×5): qty 3

## 2023-08-07 NOTE — Progress Notes (Signed)
 Patient ID: [name] [name]., male   DOB: 1980/11/06, 43 y.o.   MRN: [id number] Subjective: [name] [name]  is a 43 y.o. male with history of sickle cell disease. Chronic pain syndrome, opiate dependence and tolerance, anemia of chronic disease, and CKD stage IV who presented to the day emergency department with major complaints of generalized body pain but primarily in his lower extremities and back, rates pain  at 9/10. He describes this pain as constant and achy and throbbing. He reports frequent falls, dizziness, light headedness and overall jittering while driving.  He took his home medications with no sustained relief.  Patient continues to report pain of 9 out of 10 in his bilateral lower extremity worse in right knee.  No new concerns.  He denies headache, cough, blurry vision, chest pain, shortness of breath, nausea, vomiting or diarrhea.  No urinary symptoms.   Objective:  Vital signs in last 24 hours:  Vitals:   08/07/23 0354 08/07/23 0515 08/07/23 0736 08/07/23 1054  BP:  93/65  105/76  Pulse:  99  98  Resp: 15 16 16 17   Temp:  98.2 F (36.8 C)  98 F (36.7 C)  TempSrc:  Oral  Oral  SpO2: 96% 100%  97%  Weight:      Height:        Intake/Output from previous day:   Intake/Output Summary (Last 24 hours) at 08/07/2023 1103 Last data filed at 08/07/2023 0603 Gross per 24 hour  Intake 30 ml  Output 1425 ml  Net -1395 ml    Physical Exam: General: Alert, awake, oriented x3, in no acute distress.  HEENT: [place]/AT PEERL, EOMI Neck: Trachea midline,  no masses, no thyromegal,y no JVD, no carotid bruit OROPHARYNX:  Moist, No exudate/ erythema/lesions.  Heart: Regular rate and rhythm, without murmurs, rubs, gallops, PMI non-displaced, no heaves or thrills on palpation.  Lungs: Clear to auscultation, no wheezing or rhonchi noted. No increased vocal fremitus resonant to percussion  Abdomen: Soft, nontender, nondistended, positive bowel sounds, no masses no  hepatosplenomegaly noted..  Neuro: No focal neurological deficits noted cranial nerves II through XII grossly intact. DTRs 2+ bilaterally upper and lower extremities. Strength 5 out of 5 in bilateral upper and lower extremities. Musculoskeletal: Bilateral lower extremity tenderness. Psychiatric: Patient alert and oriented x3, good insight and cognition, good recent to remote recall. Lymph node survey: No cervical axillary or inguinal lymphadenopathy noted.  Lab Results:  Basic Metabolic Panel:    Component Value Date/Time   NA 138 08/06/2023 0813   NA 139 05/16/2023 1031   K 4.6 08/06/2023 0813   CL 105 08/06/2023 0813   CO2 23 08/06/2023 0813   BUN 50 (H) 08/06/2023 0813   BUN 35 (H) 05/16/2023 1031   CREATININE 3.90 (H) 08/06/2023 0813   CREATININE 1.45 (H) 05/12/2014 1430   GLUCOSE 93 08/06/2023 0813   CALCIUM  8.7 (L) 08/06/2023 0813   CBC:    Component Value Date/Time   WBC 7.1 08/06/2023 0813   HGB 7.4 (L) 08/06/2023 0813   HGB 7.2 (L) 08/02/2023 1018   HCT 22.2 (L) 08/06/2023 0813   HCT 21.6 (L) 08/02/2023 1018   PLT 225 08/06/2023 0813   PLT 262 08/02/2023 1018   MCV 112.7 (H) 08/06/2023 0813   MCV 111 (H) 08/02/2023 1018   NEUTROABS 2.7 08/06/2023 0813   NEUTROABS 2.4 08/02/2023 1018   LYMPHSABS 3.9 08/06/2023 0813   LYMPHSABS 4.2 (H) 08/02/2023 1018   MONOABS 0.4 08/06/2023 0813  EOSABS 0.1 08/06/2023 0813   EOSABS 0.0 08/02/2023 1018   BASOSABS 0.0 08/06/2023 0813   BASOSABS 0.0 08/02/2023 1018    No results found for this or any previous visit (from the past 240 hours).  Studies/Results: CT Knee Left Wo Contrast Result Date: 08/06/2023 CLINICAL DATA:  Bilateral knee trauma, fall EXAM: CT OF THE LEFT KNEE WITHOUT CONTRAST TECHNIQUE: Multidetector CT imaging of the left knee was performed according to the standard protocol. Multiplanar CT image reconstructions were also generated. RADIATION DOSE REDUCTION: This exam was performed according to the  departmental dose-optimization program which includes automated exposure control, adjustment of the mA and/or kV according to patient size and/or use of iterative reconstruction technique. COMPARISON:  Radiographs 08/06/2023 FINDINGS: Bones/Joint/Cartilage Moderate knee effusion. 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 This leads to a scalloped mildly depressed superior surface of the medial tibial plateau. Sclerosis in the patella likewise probably the residua from remote bone infarct. Nitrogen gas phenomenon in the medial compartment. Ligaments Suboptimally assessed by CT. Muscles and Tendons Unremarkable Soft tissues Unremarkable IMPRESSION: 1. 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 This leads to a scalloped mildly depressed superior surface of the medial tibial plateau. 2. Moderate knee effusion. 3. Sclerosis in the patella likewise probably the residua from remote bone infarct. Electronically Signed   By: Ryan Salvage M.D.   On: 08/06/2023 13:00   CT Knee Right Wo Contrast Result Date: 08/06/2023 CLINICAL DATA:  Fall, bilateral knee pain and swelling. EXAM: CT OF THE RIGHT KNEE WITHOUT CONTRAST TECHNIQUE: Multidetector CT imaging of the right knee was performed according to the standard protocol. Multiplanar CT image reconstructions were also generated. RADIATION DOSE REDUCTION: This exam was performed according to the departmental dose-optimization program which includes automated exposure control, adjustment of the mA and/or kV according to patient size and/or use of iterative reconstruction technique. COMPARISON:   Radiographs 08/06/2023 FINDINGS: Bones/Joint/Cartilage Total knee prosthesis observed. No periprosthetic fracture is identified. Bony demineralization observed. Thin rim sclerotic lucent lesion in the proximal fibular head appears chronic and likely incidental. Resurfaced patella noted. Moderate knee joint effusion. Substantial synovitis along the margins of the suprapatellar bursa. Ligaments Suboptimally assessed by CT. Muscles and Tendons Thickened distal quadriceps tendon and patellar tendon. Soft tissues Unremarkable IMPRESSION: 1. Total knee prosthesis without periprosthetic fracture identified. 2. Moderate knee joint effusion with substantial synovitis along the margins of the suprapatellar bursa. 3. Thickened distal quadriceps tendon and patellar tendon. 4. Bony demineralization. Electronically Signed   By: Ryan Salvage M.D.   On: 08/06/2023 12:56   DG Knee Complete 4 Views Left Result Date: 08/06/2023 CLINICAL DATA:  fall EXAM: LEFT KNEE - COMPLETE 4+ VIEW COMPARISON:  12/26/2021 FINDINGS: Old medial tibial plateau fracture deformity with underlying extensive sclerosis, stable. No acute fracture or dislocation. There may be a small suprapatellar effusion. Regional soft tissues unremarkable. IMPRESSION: 1. No acute fracture or other acute findings. 2. Possible small suprapatellar effusion. 3. Chronic medial tibial plateau fracture deformity. Electronically Signed   By: JONETTA Faes M.D.   On: 08/06/2023 08:43   DG Knee Complete 4 Views Right Result Date: 08/06/2023 CLINICAL DATA:  fall EXAM: RIGHT KNEE -  COMPLETE 4+ VIEW COMPARISON:  12/26/2021 FINDINGS: Interval knee arthroplasty, cemented components in expected location. No fracture or dislocation. Small suprapatellar joint effusion. Regional soft tissues unremarkable. IMPRESSION: 1. No fracture or other acute findings. 2. Small joint effusion. Electronically Signed   By: JONETTA Faes M.D.   On: 08/06/2023 08:41    Medications: Scheduled Meds:   amitriptyline   50-125 mg Oral QHS   calcitRIOL   1 mcg Oral Q breakfast   Deferiprone  (Twice Daily)  4,000 mg Oral Daily   folic acid   1 mg Oral Daily   HYDROmorphone    Intravenous Q4H   hydroxyurea   1,500 mg Oral Once per day on Tuesday Thursday   And   [START ON 08/08/2023] hydroxyurea   500 mg Oral Once per day on Sunday Monday Wednesday Friday Saturday   oxyCODONE   15 mg Oral Q12H   senna-docusate  1 tablet Oral BID   sodium bicarbonate   650 mg Oral TID with meals   sodium zirconium cyclosilicate   10 g Oral Daily   Continuous Infusions: PRN Meds:.acetaminophen , diphenhydrAMINE , naloxone  **AND** sodium chloride  flush, ondansetron  (ZOFRAN ) IV, oxyCODONE , polyethylene glycol  Consultants: Neurology  Procedures: None  Antibiotics: None  Assessment/Plan: Principal Problem:   Sickle cell anemia with pain (HCC) Active Problems:   TOBACCO ABUSE   CKD (chronic kidney disease), stage IV (HCC)   Anemia of chronic disease   Chronic pain syndrome   Fall   Hb Sickle Cell Disease with Pain crisis: Continue IVF 0.45% Saline @ 125 mls/hour, continue weight based Dilaudid  PCA.  continue oral home pain medications as ordered. Monitor vitals very closely, Re-evaluate pain scale regularly, 2 L of Oxygen  by [place]. Patient encouraged to ambulate on the hallway today.  Leukocytosis: WBC stable. Anemia of Chronic Disease: Hemoglobin within patient's baseline.  Will continue to monitor daily CBC. Chronic pain Syndrome: Continue oral home pain medicine. Falls: Neurology consult  Code Status: Full Code Family Communication: N/A Disposition Plan: Not yet ready for discharge  Homer CHRISTELLA Cover NP  If 7PM-7AM, please contact night-coverage.  08/07/2023, 11:03 AM  LOS: 1 day

## 2023-08-07 NOTE — Progress Notes (Signed)
   08/07/23 0938  TOC Brief Assessment  Insurance and Status Reviewed  Patient has primary care physician Yes  Home environment has been reviewed home  Prior level of function: independent  Prior/Current Home Services No current home services  Social Drivers of Health Review SDOH reviewed no interventions necessary  Readmission risk has been reviewed Yes  Transition of care needs no transition of care needs at this time

## 2023-08-07 NOTE — Plan of Care (Signed)
  Problem: Self-Care: Goal: Ability to incorporate actions that prevent/reduce pain crisis will improve Outcome: Progressing   Problem: Fluid Volume: Goal: Ability to maintain a balanced intake and output will improve Outcome: Progressing   Problem: Sensory: Goal: Pain level will decrease with appropriate interventions Outcome: Progressing

## 2023-08-07 NOTE — Consult Note (Signed)
 NEUROLOGY CONSULT NOTE   Date of service: August 07, 2023 Patient Name: [name] [name]. MRN:  [id number] DOB:  [birth year] Chief Complaint: falls Requesting Provider: [name], [name], MD  History of Present Illness  Elier Zellars. is a 43 y.o. male with hx of sickle cell disease, chronic pain syndrome, opiate dependence and tolerance, anemia of chronic disease, CKD stage IV, with recent admission on 07/30/2023 for sickle cell pain crisis and recent falls, discharged and back again on 08/06/2023 after having had a fall because of his legs giving out and severe constant achy and throbbing pain all over, admitted for management of his pain crisis and neurological consultation requested for this frequent falls. He reports that he usually has these falls, which have now been going on for about a year, when he feels that his potassium is high, and his legs start to tremble some and then give out.  He does not report frank feeling of passing out.  He remembers all of these episodes.   He said for the episode yesterday in the ED, he was given fluids and Lokelma  and got somewhat better but then as he got to his car, his leg started to tremble again. There has been no seizure-like activity that he recollects or anybody has witnessed.  Denies any head trauma.  Reports the falls started after he had his right knee replacement done.  Denies any similar symptoms in the upper extremities.  Denies any bowel bladder incontinence or issues related to voiding. At this time, has extreme pain all over rating it 8/10. Also reports that falls happened more with his lower hemoglobin The only symptoms at this point are severe pain all over, more pain in the legs all the way from mid thigh to mid calf bilaterally   ROS  Comprehensive ROS performed and pertinent positives documented in HPI   Past History   Past Medical History:  Diagnosis Date   Abscess of right iliac fossa 09/24/2018   required Perc  Drain 09/24/2018   Arachnoid Cyst of brain bilaterally    2 really small ones in the back of my head; inside; saw them w/MRI (09/25/2012)   Bacterial pneumonia ~ 2012   caught it here in the hospital (09/25/2012)   Chronic kidney disease    from my sickle cell (09/25/2012)   CKD (chronic kidney disease), stage II    Colitis 04/19/2017   CT scan abd/ pelvis   GERD (gastroesophageal reflux disease)    after I eat alot of spicey foods (09/25/2012)   Gynecomastia, male 07/10/2012   History of blood transfusion    always related to sickle cell crisis (09/25/2012)   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   Noted in noted from H[facility] at [facility]   Migraines    take RX qd to prevent them (09/25/2012)   Opioid dependence with withdrawal (HCC) 08/30/2012   Renal insufficiency    Sickle cell anemia (HCC)    Sickle cell crisis (HCC) 09/25/2012   Sickle cell nephropathy (HCC) 07/10/2012   Sinus tachycardia    Tachycardia with heart rate 121-140 beats per minute with ambulation 08/04/2016    Past Surgical History:  Procedure Laterality Date   ABCESS DRAINAGE     CHOLECYSTECTOMY  ~ 2012   COLONOSCOPY N/A 04/23/2017   Procedure: COLONOSCOPY;  Surgeon: [name], MD;  Location: [facility];  Service: Endoscopy;  Laterality: N/A;   IR FLUORO GUIDE CV LINE RIGHT  12/17/2016   IR  FLUORO GUIDE CV LINE RIGHT  02/07/2021   IR RADIOLOGIST EVAL & MGMT  10/02/2018   IR RADIOLOGIST EVAL & MGMT  10/15/2018   IR REMOVAL TUN CV CATH W/O FL  12/21/2016   IR REMOVAL TUN CV CATH W/O FL  02/11/2021   IR US  GUIDE VASC ACCESS RIGHT  12/17/2016   IR US  GUIDE VASC ACCESS RIGHT  02/07/2021   spleenectomy      Family History: Family History  Problem Relation Age of Onset   Breast cancer Mother     Social History  reports that he has quit smoking. His smoking use included cigarettes. He has never used smokeless tobacco. He reports current alcohol  use. He reports current drug use.  Drug: Marijuana.  Allergies  Allergen Reactions   Nsaids Other (See Comments)    Kidney disease   Morphine  And Codeine  Other (See Comments)    Really bad headaches     Medications   Current Facility-Administered Medications:    acetaminophen  (TYLENOL ) tablet 1,000 mg, 1,000 mg, Oral, Q6H PRN, Ijaola, [name] M, NP, 1,000 mg at 08/06/23 1741   amitriptyline  (ELAVIL ) tablet 50-125 mg, 50-125 mg, Oral, QHS, Ijaola, [name] M, NP, 100 mg at 08/06/23 2114   calcitRIOL  (ROCALTROL ) capsule 1 mcg, 1 mcg, Oral, Q breakfast, Ijaola, [name] M, NP, 1 mcg at 08/07/23 9150   Deferiprone  (Twice Daily) TABS 4,000 mg, 4,000 mg, Oral, Daily, Ijaola, [name] M, NP   diphenhydrAMINE  (BENADRYL ) capsule 25 mg, 25 mg, Oral, Q4H PRN, Ijaola, [name] M, NP   folic acid  (FOLVITE ) tablet 1 mg, 1 mg, Oral, Daily, Ijaola, [name] M, NP, 1 mg at 08/07/23 0849   HYDROmorphone  (DILAUDID ) 1 mg/mL PCA injection, , Intravenous, Q4H, Ijaola, [name] M, NP, 3.5 mg at 08/07/23 1213   hydroxyurea  (HYDREA ) capsule 1,500 mg, 1,500 mg, Oral, Once per day on Tuesday Thursday, 1,500 mg at 08/07/23 0849 **AND** [START ON 08/08/2023] hydroxyurea  (HYDREA ) capsule 500 mg, 500 mg, Oral, Once per day on Sunday Monday Wednesday Friday Saturday, Carolee Rosaline DASEN, [facility]   naloxone  (NARCAN ) injection 0.4 mg, 0.4 mg, Intravenous, PRN **AND** sodium chloride  flush (NS) 0.9 % injection 9 mL, 9 mL, Intravenous, PRN, Ijaola, [name] M, NP   ondansetron  (ZOFRAN ) injection 4 mg, 4 mg, Intravenous, Q6H PRN, Ijaola, [name] M, NP   oxyCODONE  (Oxy IR/ROXICODONE ) immediate release tablet 10 mg, 10 mg, Oral, Q6H PRN, Ijaola, [name] M, NP, 10 mg at 08/06/23 1741   oxyCODONE  (OXYCONTIN ) 12 hr tablet 15 mg, 15 mg, Oral, Q12H, Ijaola, [name] M, NP, 15 mg at 08/07/23 0949   polyethylene glycol (MIRALAX  / GLYCOLAX ) packet 17 g, 17 g, Oral, Daily PRN, Ijaola, [name] M, NP   senna-docusate (Senokot-S) tablet 1 tablet, 1 tablet, Oral, BID, Ijaola, [name] M, NP   sodium  bicarbonate tablet 1,950 mg, 1,950 mg, Oral, TID with meals, Ijaola, [name] M, NP   sodium zirconium cyclosilicate  (LOKELMA ) packet 10 g, 10 g, Oral, Daily, Ijaola, [name] M, NP, 10 g at 08/07/23 0949  Vitals   Vitals:   08/07/23 0515 08/07/23 0736 08/07/23 1054 08/07/23 1213  BP: 93/65  105/76   Pulse: 99  98   Resp: 16 16 17 18   Temp: 98.2 F (36.8 C)  98 F (36.7 C)   TempSrc: Oral  Oral   SpO2: 100%  97%   Weight:      Height:        Body mass index is 23.04 kg/m.   Physical Exam   GEN: Well-developed  well-nourished man in no acute distress HEENT: Normocephalic atraumatic Chest is clear Cardiovascular: Regular rate rhythm Neurological exam Is awake alert oriented x 3 There is no dysarthria No evidence of aphasia Cranial nerves II to XII intact Motor examination with no drift in any of the 4 extremities.  Plantar and dorsiflexion 5/5. He would not let me individually test his other lower extremity muscles other than plantar and dorsiflexors because of excessive pain that he feels with me touching him.  He also would not let me elicit his deep tendon reflexes in the lower extremities at all. Sensation intact to light touch without extinction.  Severe allodynia bilaterally on the legs on light touch. No dysmetria  Labs/Imaging/Neurodiagnostic studies   CBC:  Recent Labs  Lab 09-01-23 0751 08/06/23 0813  WBC 10.0 7.1  NEUTROABS 2.3 2.7  HGB 7.9* 7.4*  HCT 23.7* 22.2*  MCV 114.5* 112.7*  PLT 229 225   Basic Metabolic Panel:  Lab Results  Component Value Date   NA 138 08/06/2023   K 4.6 08/06/2023   CO2 23 08/06/2023   GLUCOSE 93 08/06/2023   BUN 50 (H) 08/06/2023   CREATININE 3.90 (H) 08/06/2023   CALCIUM  8.7 (L) 08/06/2023   GFRNONAA 19 (L) 08/06/2023   GFRAA 38 (L) 10/14/2019   Lipid Panel:  Lab Results  Component Value Date   LDLCALC 62 01/24/2022   HgbA1c:  Lab Results  Component Value Date   HGBA1C 8.3 03/07/2023   Urine Drug Screen:      Component Value Date/Time   LABOPIA POSITIVE (A) 08/06/2023 1424   COCAINSCRNUR NONE DETECTED 08/06/2023 1424   COCAINSCRNUR Negative 08/02/2020 0110   COCAINSCRNUR POS (A) 01/22/2013 1121   LABBENZ NONE DETECTED 08/06/2023 1424   LABBENZ NEG 01/22/2013 1121   AMPHETMU NONE DETECTED 08/06/2023 1424   THCU NONE DETECTED 08/06/2023 1424   LABBARB NONE DETECTED 08/06/2023 1424    Alcohol  Level     Component Value Date/Time   ETH <5 02/26/2016 1723   INR  Lab Results  Component Value Date   INR 1.0 09/24/2018   APTT  Lab Results  Component Value Date   APTT 34 03/07/2018   CT Head without contrast(Personally reviewed): 05/09/2023-no acute abnormality.  Chronic appearing left cerebellar infarct new since 2022.  Probable chronic/congenital arachnoid cyst of the left insula and operculum.  ASSESSMENT   Shreyansh Tiffany. is a 43 y.o. male past history of sickle cell disease, chronic pain syndrome, opiate dependence and tolerance, anemia of chronic disease, CKD stage IV, multiple admissions for sickle cell crisis, recent falls who is being admitted this time for management of pain as well as evaluation for falls for which neurology was consulted. Going by his history, he reports that the falls started after his right knee surgery.  He feels some instability there but he feels both his legs give out equally.  On examination, I did not appreciate any focal weakness.  He did have excessive allodynia on both his legs and would not let me complete the examination with reflexes. It was hard to pinpoint what has led to all these falls over the last 1 year or so.  His dependence on opiates could be 1 reason but given the CT had appearance of a chronic cerebellar infarct, evaluation for new strokes should also be undertaken.  The other etiology to be looked at should be a lumbar spine pathology.  Outpatient evaluation should be with EMG nerve conduction studies  RECOMMENDATIONS  Optimization  of pain regimen to ensure he is not over sedated while on medications. MRI brain without contrast to evaluate for any evidence of strokes given presence of risk factors MRI of the L-spine Timeline of these falls is long and in between the falls he appears completely normal which goes against any sort of polyradiculoneuropathy and I do not think he needs a spinal tap emergently at this time. I think if the above tests are unremarkable, he would benefit from a neuromuscular neurology evaluation and an EMG nerve conduction study.  I would recommend following up with either Dr. Tobie or Dr. Leigh at [facility] neurology upon discharge I will follow the imaging studies with you.  Plan was relayed to the sickle cell team APP O. Cherylene and Dr. [name] ______________________________________________________________________    Signed, Eligio Lav, MD [facility]

## 2023-08-08 LAB — CBC
HCT: 20 % — ABNORMAL LOW (ref 39.0–52.0)
Hemoglobin: 6.6 g/dL — CL (ref 13.0–17.0)
MCH: 38.2 pg — ABNORMAL HIGH (ref 26.0–34.0)
MCHC: 33 g/dL (ref 30.0–36.0)
MCV: 115.6 fL — ABNORMAL HIGH (ref 80.0–100.0)
Platelets: 236 10*3/uL (ref 150–400)
RBC: 1.73 MIL/uL — ABNORMAL LOW (ref 4.22–5.81)
WBC: 8.2 10*3/uL (ref 4.0–10.5)
nRBC: 1 % — ABNORMAL HIGH (ref 0.0–0.2)

## 2023-08-08 LAB — BASIC METABOLIC PANEL WITH GFR
Anion gap: 9 (ref 5–15)
BUN: 42 mg/dL — ABNORMAL HIGH (ref 6–20)
CO2: 20 mmol/L — ABNORMAL LOW (ref 22–32)
Calcium: 9.1 mg/dL (ref 8.9–10.3)
Chloride: 108 mmol/L (ref 98–111)
Creatinine, Ser: 2.67 mg/dL — ABNORMAL HIGH (ref 0.61–1.24)
GFR, Estimated: 30 mL/min — ABNORMAL LOW (ref >60)
Glucose, Bld: 99 mg/dL (ref 70–99)
Potassium: 5.4 mmol/L — ABNORMAL HIGH (ref 3.5–5.1)
Sodium: 137 mmol/L (ref 135–145)

## 2023-08-08 LAB — PREPARE RBC (CROSSMATCH)

## 2023-08-08 MED ORDER — DIPHENHYDRAMINE HCL 25 MG PO CAPS
25.0000 mg | ORAL_CAPSULE | Freq: Once | ORAL | Status: AC
  Administered 2023-08-08: 25 mg via ORAL
  Filled 2023-08-08: qty 1

## 2023-08-08 MED ORDER — SODIUM CHLORIDE 0.9% IV SOLUTION: Freq: Once | INTRAVENOUS | Status: AC

## 2023-08-08 MED ORDER — ACETAMINOPHEN 325 MG PO TABS
650.0000 mg | ORAL_TABLET | Freq: Once | ORAL | Status: AC
  Administered 2023-08-08: 650 mg via ORAL
  Filled 2023-08-08: qty 2

## 2023-08-08 NOTE — Plan of Care (Signed)

## 2023-08-08 NOTE — Progress Notes (Signed)
 MRI brain and L-spine with no acute abnormalities I would recommend outpatient referral to neuromuscular neurology-[place] [facility]-Dr. Tobie or Dr. Leigh for further evaluation of his falls. May need consideration for conditions such as hyperkalemic periodic paralysis with genetic testing-will defer to outpatient evaluation. No further inpatient neurological workup needed at this time Pain crisis management per primary team Please call back with questions as needed   -- Eligio Lav, MD Neurologist [facility]

## 2023-08-08 NOTE — Progress Notes (Signed)
 Patient ID: [name] DELENA Brunilda Mickey., male   DOB: October 28, 1980, 43 y.o.   MRN: [id number] Subjective: [name] [name]  is a 43 y.o. male with history of sickle cell disease. Chronic pain syndrome, opiate dependence and tolerance, anemia of chronic disease, and CKD stage IV who presented to the day emergency department with major complaints of generalized body pain but primarily in his lower extremities and back, rates pain  at 9/10. He describes this pain as constant and achy and throbbing. He reports frequent falls, dizziness, light headedness and overall jittering while driving.  He took his home medications with no sustained relief.  Patient continues to report pain of 8/10 in his bilateral lower extremity worse in right knee.  No new concerns.  He denies headache, cough, blurry vision, chest pain, shortness of breath, nausea, vomiting or diarrhea.  No urinary symptoms.   Objective:  Vital signs in last 24 hours:  Vitals:   08/08/23 1200 08/08/23 1210 08/08/23 1400 08/08/23 1401  BP:   (!) 119/94   Pulse:   (!) 110 (!) 110  Resp: 16 16 18    Temp:   98.2 F (36.8 C)   TempSrc:   Oral   SpO2: 99%   100%  Weight:      Height:        Intake/Output from previous day:   Intake/Output Summary (Last 24 hours) at 08/08/2023 1601 Last data filed at 08/08/2023 1028 Gross per 24 hour  Intake 794.5 ml  Output 2175 ml  Net -1380.5 ml    Physical Exam: General: Alert, awake, oriented x3, in no acute distress.  HEENT: [place]/AT PEERL, EOMI Neck: Trachea midline,  no masses, no thyromegal,y no JVD, no carotid bruit OROPHARYNX:  Moist, No exudate/ erythema/lesions.  Heart: Regular rate and rhythm, without murmurs, rubs, gallops, PMI non-displaced, no heaves or thrills on palpation.  Lungs: Clear to auscultation, no wheezing or rhonchi noted. No increased vocal fremitus resonant to percussion  Abdomen: Soft, nontender, nondistended, positive bowel sounds, no masses no hepatosplenomegaly noted..   Neuro: No focal neurological deficits noted cranial nerves II through XII grossly intact. DTRs 2+ bilaterally upper and lower extremities. Strength 5 out of 5 in bilateral upper and lower extremities. Musculoskeletal: Bilateral lower extremity tenderness. Psychiatric: Patient alert and oriented x3, good insight and cognition, good recent to remote recall. Lymph node survey: No cervical axillary or inguinal lymphadenopathy noted.  Lab Results:  Basic Metabolic Panel:    Component Value Date/Time   NA 137 08/08/2023 0823   NA 139 05/16/2023 1031   K 5.4 (H) 08/08/2023 0823   CL 108 08/08/2023 0823   CO2 20 (L) 08/08/2023 0823   BUN 42 (H) 08/08/2023 0823   BUN 35 (H) 05/16/2023 1031   CREATININE 2.67 (H) 08/08/2023 0823   CREATININE 1.45 (H) 05/12/2014 1430   GLUCOSE 99 08/08/2023 0823   CALCIUM  9.1 08/08/2023 0823   CBC:    Component Value Date/Time   WBC 8.2 08/08/2023 0612   HGB 6.6 (LL) 08/08/2023 0612   HGB 7.2 (L) 08/02/2023 1018   HCT 20.0 (L) 08/08/2023 0612   HCT 21.6 (L) 08/02/2023 1018   PLT 236 08/08/2023 0612   PLT 262 08/02/2023 1018   MCV 115.6 (H) 08/08/2023 0612   MCV 111 (H) 08/02/2023 1018   NEUTROABS 2.7 08/06/2023 0813   NEUTROABS 2.4 08/02/2023 1018   LYMPHSABS 3.9 08/06/2023 0813   LYMPHSABS 4.2 (H) 08/02/2023 1018   MONOABS 0.4 08/06/2023 0813   EOSABS 0.1  08/06/2023 0813   EOSABS 0.0 08/02/2023 1018   BASOSABS 0.0 08/06/2023 0813   BASOSABS 0.0 08/02/2023 1018    No results found for this or any previous visit (from the past 240 hours).  Studies/Results: MR LUMBAR SPINE WO CONTRAST Result Date: 08/07/2023 CLINICAL DATA:  Initial evaluation for frequent falls, leg weakness. History of sickle cell disease. EXAM: MRI LUMBAR SPINE WITHOUT CONTRAST TECHNIQUE: Multiplanar, multisequence MR imaging of the lumbar spine was performed. No intravenous contrast was administered. COMPARISON:  CT from 07/19/2023 FINDINGS: Segmentation: Standard. Lowest  well-formed disc space labeled the L5-S1 level. Alignment: Mild levoscoliosis with straightening of the normal lumbar lordosis. No significant listhesis. Vertebrae: Vertebral body height maintained without acute or chronic fracture. Diffusely abnormal appearance of the visualized bone marrow, presumably related to history of sickle cell anemia. No definite worrisome osseous lesions. Scattered degenerative reactive endplate changes noted, most pronounced at L3-4. No other abnormal marrow edema. Conus medullaris and cauda equina: Conus extends to the L1 level. Conus and cauda equina appear normal. Paraspinal and other soft tissues: Paraspinous soft tissues within normal limits. Kidneys are atrophic in appearance bilaterally. Disc levels: L1-2: Degenerative intervertebral disc space narrowing with mild diffuse disc bulge and reactive endplate spurring. No spinal stenosis. Foramina remain patent. L2-3: Mild diffuse disc bulge with disc desiccation and reactive endplate spurring. No spinal stenosis. Foramina remain patent. L3-4: Degenerative intervertebral disc space narrowing with diffuse disc bulge. Associated reactive endplate spurring. Resultant mild spinal stenosis. Mild bilateral L3 foraminal narrowing. L4-5: Disc desiccation with mild diffuse disc bulge. Reactive endplate spurring. Mild narrowing of the lateral recesses bilaterally. Central canal remains patent. Mild bilateral L4 foraminal stenosis. L5-S1: Disc desiccation with mild disc bulge and reactive endplate spurring. No spinal stenosis. Mild bilateral L5 foraminal narrowing. IMPRESSION: 1. No acute abnormality within the lumbar spine. No high-grade spinal stenosis or neural impingement. 2. Multilevel lumbar spondylosis with resultant mild spinal stenosis at L3-4. Mild bilateral L3 through L5 foraminal narrowing. 3. Diffusely abnormal appearance of the visualized bone marrow, presumably related to history of sickle cell anemia. Electronically Signed   By:  Morene Hoard M.D.   On: 08/07/2023 19:26   MR BRAIN WO CONTRAST Result Date: 08/07/2023 CLINICAL DATA:  Initial evaluation for frequent falls, gait disturbance. EXAM: MRI HEAD WITHOUT CONTRAST TECHNIQUE: Multiplanar, multiecho pulse sequences of the brain and surrounding structures were obtained without intravenous contrast. COMPARISON:  CT from 05/09/2023 FINDINGS: Brain: Diffuse prominence of the CSF containing spaces compatible with atrophy, somewhat advanced for age. Minimal T2/FLAIR hyperintensity involving the periventricular white matter, nonspecific, but most commonly related to chronic microvascular ischemic disease, mild in nature. Few probable tiny remote cerebellar infarcts noted bilaterally. No abnormal foci of restricted diffusion to suggest acute or subacute ischemia. No areas of chronic cortical infarction. No acute intracranial hemorrhage. Chronic hemosiderin staining noted at the central aspect of the cerebellum, suggesting prior hemorrhage at this location (series 10, image 13). No other chronic intracranial blood products. Probable benign arachnoid cyst measuring up to 5.9 cm at the level of the left frontal operculum. Suspected additional smaller arachnoid cyst at the contralateral right temporal pole measuring up to 1.9 cm. No other mass lesion, mass effect, or midline shift. Ventricles normal size without hydrocephalus. No other extra-axial fluid collection. Pituitary gland and suprasellar region within normal limits. Vascular: Major intracranial vascular flow voids are maintained. Skull and upper cervical spine: Craniocervical junction within normal limits. Diffusely heterogeneous and abnormal appearance of visualized bone marrow, presumably related to history  of sickle cell anemia. No scalp soft tissue abnormality. Sinuses/Orbits: Globes orbital soft tissues within normal limits. Small volume pneumatized secretions noted within left sphenoid sinus. Paranasal sinuses are otherwise  clear. No mastoid effusion. Other: None. IMPRESSION: 1. No acute intracranial abnormality. 2. Atrophy with mild chronic small vessel ischemic disease, with a few probable tiny remote cerebellar infarcts. 3. Chronic hemosiderin staining at the central aspect of the cerebellum, consistent with prior hemorrhage at this location. 4. Benign arachnoid cysts at the left frontal operculum and likely right temporal pole as above. 5. Diffusely heterogeneous and abnormal appearance of visualized bone marrow, presumably related to history of sickle cell anemia. Electronically Signed   By: Morene Hoard M.D.   On: 08/07/2023 19:19    Medications: Scheduled Meds:  sodium chloride    Intravenous Once   acetaminophen   650 mg Oral Once   amitriptyline   50-125 mg Oral QHS   calcitRIOL   1 mcg Oral Q breakfast   Deferiprone  (Twice Daily)  4,000 mg Oral Daily   diphenhydrAMINE   25 mg Oral Once   folic acid   1 mg Oral Daily   HYDROmorphone    Intravenous Q4H   hydroxyurea   1,500 mg Oral Once per day on Tuesday Thursday   And   hydroxyurea   500 mg Oral Once per day on Sunday Monday Wednesday Friday Saturday   oxyCODONE   15 mg Oral Q12H   senna-docusate  1 tablet Oral BID   sodium bicarbonate   1,950 mg Oral TID with meals   sodium zirconium cyclosilicate   10 g Oral Daily   Continuous Infusions: PRN Meds:.acetaminophen , diphenhydrAMINE , naloxone  **AND** sodium chloride  flush, ondansetron  (ZOFRAN ) IV, oxyCODONE , polyethylene glycol  Consultants: Neurology  Procedures: None  Antibiotics: None  Assessment/Plan: Principal Problem:   Sickle cell anemia with pain (HCC) Active Problems:   TOBACCO ABUSE   CKD (chronic kidney disease), stage IV (HCC)   Anemia of chronic disease   Chronic pain syndrome   Fall   Hb Sickle Cell Disease with Pain crisis: Continue IVF 0.45% Saline @ 125 mls/hour, continue weight based Dilaudid  PCA.  continue oral home pain medications as ordered. Monitor vitals very  closely, Re-evaluate pain scale regularly, 2 L of Oxygen  by [place]. Patient encouraged to ambulate on the hallway today.  Leukocytosis: WBC stable. Anemia of Chronic Disease: Hemoglobin within patient's baseline.  Will continue to monitor daily CBC. Chronic pain Syndrome: Continue oral home pain medicine. Falls: Neurology consult completed: IMPRESSION:  No acute abnormality within the lumbar spine. No high-grade spinal stenosis or neural impingement.  Multilevel lumbar spondylosis with resultant mild spinal stenosis at L3-4. Mild bilateral L3 through L5 foraminal narrowing. Diffusely abnormal appearance of the visualized bone marrow, presumably related to history of sickle cell anemia.   Dr Voncile recommends outpatient referral to neuromuscular neurology-[name]-Dr. Tobie or Dr. Leigh for further evaluation of his falls. May need consideration for conditions such as hyperkalemic periodic paralysis with genetic testing-will defer to outpatient evaluation. No further inpatient neurological workup needed at this time  Patient notified and educated about follow-up outpatient in 4 to 6 weeks.  Patient states I will not be following up with neurology because I do not need any further evaluation or testing.  Code Status: Full Code Family Communication: N/A Disposition Plan: Not yet ready for discharge  Homer CHRISTELLA Cover NP  If 7PM-7AM, please contact night-coverage.  08/08/2023, 4:01 PM  LOS: 2 days

## 2023-08-09 ENCOUNTER — Other Ambulatory Visit: Payer: Self-pay | Admitting: Nurse Practitioner

## 2023-08-09 ENCOUNTER — Telehealth: Payer: Self-pay

## 2023-08-09 DIAGNOSIS — D571 Sickle-cell disease without crisis: Secondary | ICD-10-CM

## 2023-08-09 DIAGNOSIS — G894 Chronic pain syndrome: Secondary | ICD-10-CM

## 2023-08-09 LAB — TYPE AND SCREEN
ABO/RH(D): O POS
Antibody Screen: NEGATIVE
Donor AG Type: NEGATIVE
Unit division: 0

## 2023-08-09 LAB — CBC
HCT: 25.2 % — ABNORMAL LOW (ref 39.0–52.0)
Hemoglobin: 8.8 g/dL — ABNORMAL LOW (ref 13.0–17.0)
MCH: 37.4 pg — ABNORMAL HIGH (ref 26.0–34.0)
MCHC: 34.9 g/dL (ref 30.0–36.0)
MCV: 107.2 fL — ABNORMAL HIGH (ref 80.0–100.0)
Platelets: 262 10*3/uL (ref 150–400)
RBC: 2.35 MIL/uL — ABNORMAL LOW (ref 4.22–5.81)
WBC: 7.5 10*3/uL (ref 4.0–10.5)
nRBC: 0.8 % — ABNORMAL HIGH (ref 0.0–0.2)

## 2023-08-09 LAB — BPAM RBC
Blood Product Expiration Date: 202507052359
ISSUE DATE / TIME: 202506251657
Unit Type and Rh: 5100

## 2023-08-09 LAB — BASIC METABOLIC PANEL WITH GFR
Anion gap: 8 (ref 5–15)
BUN: 49 mg/dL — ABNORMAL HIGH (ref 6–20)
CO2: 21 mmol/L — ABNORMAL LOW (ref 22–32)
Calcium: 8.7 mg/dL — ABNORMAL LOW (ref 8.9–10.3)
Chloride: 106 mmol/L (ref 98–111)
Creatinine, Ser: 3.31 mg/dL — ABNORMAL HIGH (ref 0.61–1.24)
GFR, Estimated: 23 mL/min — ABNORMAL LOW (ref >60)
Glucose, Bld: 126 mg/dL — ABNORMAL HIGH (ref 70–99)
Potassium: 5.6 mmol/L — ABNORMAL HIGH (ref 3.5–5.1)
Sodium: 135 mmol/L (ref 135–145)

## 2023-08-09 MED ORDER — ORAL CARE MOUTH RINSE: 15.0000 mL | OROMUCOSAL | Status: DC | PRN

## 2023-08-09 NOTE — Progress Notes (Signed)
 Discharge instructions have been previewed with the patient. He inquired about refills for Oxycodone  10 mg tablets. The pt denied additional questions or concerns at this time.Hospitalist provider contacted and Pt encouraged to contact PCP provider. IVs x 2 removed, sites are clean dry and intact.

## 2023-08-09 NOTE — Telephone Encounter (Signed)
 Copied from CRM [id number]. Topic: Clinical - Medication Refill >> Aug 09, 2023 10:39 AM Edsel HERO wrote: Medication: oxyCODONE  (Oxy IR/ROXICODONE ) immediate release tablet 10 mg  Has the patient contacted their pharmacy? No (Agent: If no, request that the patient contact the pharmacy for the refill. If patient does not wish to contact the pharmacy document the reason why and proceed with request.) (Agent: If yes, when and what did the pharmacy advise?)  This is the patient's preferred pharmacy:   [facility] [address], [place] - [address] AT [address]  [place] [address] Phone: [phone] Fax: [phone]   Is this the correct pharmacy for this prescription? Yes Has the prescription been filled recently? Yes  Is the patient out of the medication? Yes  Has the patient been seen for an appointment in the last year OR does the patient have an upcoming appointment? Yes  Can we respond through MyChart? Yes

## 2023-08-09 NOTE — Discharge Summary (Addendum)
 Physician Discharge Summary  [name] [name]. FMW:[id number] DOB: 04-14-1980 DOA: 08/06/2023  PCP: [name], NP  Admit date: 08/06/2023  Discharge date: 08/10/2023  Discharge Diagnoses:  Principal Problem:   Sickle cell anemia with pain (HCC) Active Problems:   TOBACCO ABUSE   CKD (chronic kidney disease), stage IV (HCC)   Anemia of chronic disease   Chronic pain syndrome   Fall   Discharge Condition: Stable  Disposition:  Pt is discharged home in good condition and is to follow up with [name], NP this week to have labs evaluated. [name] A Sukup Jr. is instructed to increase activity slowly and balance with rest for the next few days, and use prescribed medication to complete treatment of pain  Diet: Regular Wt Readings from Last 3 Encounters:  08/06/23 59 kg  08/05/23 59 kg  07/10/23 59 kg    History of present illness:  [name] [name]  is a 43 y.o. male with history of sickle cell disease. Chronic pain syndrome, opiate dependence and tolerance, anemia of chronic disease, and CKD stage IV who presented to the day emergency department with major complaints of generalized body pain but primarily in his lower extremities and back, rates pain  at 9/10. He describes this pain as constant and achy and throbbing. He reports frequent falls, dizziness, light headedness and overall jittering while driving.  He took his home medications with no sustained relief. He denies any trauma from his recent falls, fever, headache, cough, blurry vision, chest pain, shortness of breath, nausea, vomiting or diarrhea. No urinary symptoms. He denies any recent travels, sick contacts   Hospital Course:  Patient was admitted for sickle cell pain crisis and managed appropriately with IVF, IV Dilaudid  via PCA as well as other adjunct therapies per sickle cell pain management protocols.  While in the hospital patient completed neurologic consult for his recent frequent falls and  completed neurology consult.  MRI shows no stenosis or neural impingement.  Multilevel lumbar spondylosis with resultant mild spinal stenosis at L3-4. Mild bilateral L3 through L5 foraminal narrowing. Diffusely abnormal appearance of the visualized bone marrow, presumably related to history of sickle cell anemia. Dr Voncile recommends outpatient referral to neuromuscular neurology-[facility]-Dr. Tobie or Dr. Leigh for further evaluation of his falls. May need consideration for conditions such as hyperkalemic periodic paralysis with genetic testing-will defer to outpatient evaluation. No further inpatient neurological workup needed at this time.   Patient notified and educated about follow-up outpatient in 4 to 6 weeks.  Patient states I will not be following up with neurology because I do not need any further evaluation or testing.  Patient received 1 unit PRBC transfusion for low hemoglobin. He tolerated infusion without complication.  Patient is ambulating today without assistance he is tolerating p.o. without nausea or vomiting.  patient is reporting significant improvement to pain today and requesting to be discharged. Patient was therefore discharged home today in a hemodynamically stable condition.   [name] [name] will follow-up with PCP within 1 week of this discharge. [name] [name] was counseled extensively about nonpharmacologic means of pain management, patient verbalized understanding and was appreciative of  the care received during this admission.   We discussed the need for good hydration, monitoring of hydration status, avoidance of heat, cold, stress, and infection triggers. We discussed the need to be adherent with taking other home medications. Patient was reminded of the need to seek medical attention immediately if any symptom of bleeding, anemia, or infection occurs.  Discharge  Exam: Vitals:   08/09/23 0453 08/09/23 0836  BP: 117/72   Pulse: 98   Resp: 16 17  Temp: 98.4 F (36.9  C)   SpO2: 99% 100%   Vitals:   08/09/23 0034 08/09/23 0054 08/09/23 0453 08/09/23 0836  BP: 113/83  117/72   Pulse: (!) 101  98   Resp: 16 16 16 17   Temp: 98.8 F (37.1 C)  98.4 F (36.9 C)   TempSrc: Oral  Oral   SpO2: 99%  99% 100%  Weight:      Height:        General appearance : Awake, alert, not in any distress. Speech Clear. Not toxic looking HEENT: Atraumatic and Normocephalic, pupils equally reactive to light and accomodation Neck: Supple, no JVD. No cervical lymphadenopathy.  Chest: Good air entry bilaterally, no added sounds  CVS: S1 S2 regular, no murmurs.  Abdomen: Bowel sounds present, Non tender and not distended with no gaurding, rigidity or rebound. Extremities: B/L Lower Ext shows no edema, both legs are warm to touch Neurology: Awake alert, and oriented X 3, CN II-XII intact, Non focal Skin: No Rash  Discharge Instructions  Discharge Instructions     Call MD for:  severe uncontrolled pain   Complete by: As directed    Call MD for:  temperature >100.4   Complete by: As directed    Diet - low sodium heart healthy   Complete by: As directed    Increase activity slowly   Complete by: As directed       Allergies as of 08/09/2023       Reactions   Nsaids Other (See Comments)   Kidney disease   Morphine  And Codeine  Other (See Comments)   Really bad headaches         Medication List     TAKE these medications    amitriptyline  25 MG tablet Commonly known as: ELAVIL  TAKE 2 TABLETS(50 MG) BY MOUTH AT BEDTIME What changed: See the new instructions.   calcitRIOL  0.25 MCG capsule Commonly known as: ROCALTROL  Take 0.25 mcg by mouth daily with breakfast.   Ferriprox  Twice-A-Day 1000 MG Tabs Generic drug: Deferiprone  (Twice Daily) Take 4,000 mg by mouth daily.   folic acid  1 MG tablet Commonly known as: FOLVITE  TAKE 1 TABLET BY MOUTH EVERY DAY   hydroxyurea  500 MG capsule Commonly known as: HYDREA  Take 1 capsule by mouth in the morning  on Sun/Tues/Wed/Thurs/Sat and 2 capsules on Mon/Fri What changed: additional instructions   Oxycodone  HCl 10 MG Tabs Take 1 tablet (10 mg total) by mouth See admin instructions. Take 10 mg by mouth every 6 hours What changed: when to take this   oxyCODONE  15 mg 12 hr tablet Commonly known as: OXYCONTIN  Take 1 tablet (15 mg) by mouth every 12 hours. What changed: Another medication with the same name was changed. Make sure you understand how and when to take each.   sodium bicarbonate  650 MG tablet Take 1,950 mg by mouth 3 (three) times daily.   sodium zirconium cyclosilicate  10 g Pack packet Commonly known as: LOKELMA  Take 10 g by mouth daily.   Tylenol  8 Hour Arthritis Pain 650 MG CR tablet Generic drug: acetaminophen  Take 1,300 mg by mouth as needed for pain.        The results of significant diagnostics from this hospitalization (including imaging, microbiology, ancillary and laboratory) are listed below for reference.    Significant Diagnostic Studies: MR LUMBAR SPINE WO CONTRAST Result Date: 08/07/2023 CLINICAL DATA:  Initial evaluation for frequent falls, leg weakness. History of sickle cell disease. EXAM: MRI LUMBAR SPINE WITHOUT CONTRAST TECHNIQUE: Multiplanar, multisequence MR imaging of the lumbar spine was performed. No intravenous contrast was administered. COMPARISON:  CT from 07/19/2023 FINDINGS: Segmentation: Standard. Lowest well-formed disc space labeled the L5-S1 level. Alignment: Mild levoscoliosis with straightening of the normal lumbar lordosis. No significant listhesis. Vertebrae: Vertebral body height maintained without acute or chronic fracture. Diffusely abnormal appearance of the visualized bone marrow, presumably related to history of sickle cell anemia. No definite worrisome osseous lesions. Scattered degenerative reactive endplate changes noted, most pronounced at L3-4. No other abnormal marrow edema. Conus medullaris and cauda equina: Conus extends to the  L1 level. Conus and cauda equina appear normal. Paraspinal and other soft tissues: Paraspinous soft tissues within normal limits. Kidneys are atrophic in appearance bilaterally. Disc levels: L1-2: Degenerative intervertebral disc space narrowing with mild diffuse disc bulge and reactive endplate spurring. No spinal stenosis. Foramina remain patent. L2-3: Mild diffuse disc bulge with disc desiccation and reactive endplate spurring. No spinal stenosis. Foramina remain patent. L3-4: Degenerative intervertebral disc space narrowing with diffuse disc bulge. Associated reactive endplate spurring. Resultant mild spinal stenosis. Mild bilateral L3 foraminal narrowing. L4-5: Disc desiccation with mild diffuse disc bulge. Reactive endplate spurring. Mild narrowing of the lateral recesses bilaterally. Central canal remains patent. Mild bilateral L4 foraminal stenosis. L5-S1: Disc desiccation with mild disc bulge and reactive endplate spurring. No spinal stenosis. Mild bilateral L5 foraminal narrowing. IMPRESSION: 1. No acute abnormality within the lumbar spine. No high-grade spinal stenosis or neural impingement. 2. Multilevel lumbar spondylosis with resultant mild spinal stenosis at L3-4. Mild bilateral L3 through L5 foraminal narrowing. 3. Diffusely abnormal appearance of the visualized bone marrow, presumably related to history of sickle cell anemia. Electronically Signed   By: Morene Hoard M.D.   On: 08/07/2023 19:26   MR BRAIN WO CONTRAST Result Date: 08/07/2023 CLINICAL DATA:  Initial evaluation for frequent falls, gait disturbance. EXAM: MRI HEAD WITHOUT CONTRAST TECHNIQUE: Multiplanar, multiecho pulse sequences of the brain and surrounding structures were obtained without intravenous contrast. COMPARISON:  CT from 05/09/2023 FINDINGS: Brain: Diffuse prominence of the CSF containing spaces compatible with atrophy, somewhat advanced for age. Minimal T2/FLAIR hyperintensity involving the periventricular white  matter, nonspecific, but most commonly related to chronic microvascular ischemic disease, mild in nature. Few probable tiny remote cerebellar infarcts noted bilaterally. No abnormal foci of restricted diffusion to suggest acute or subacute ischemia. No areas of chronic cortical infarction. No acute intracranial hemorrhage. Chronic hemosiderin staining noted at the central aspect of the cerebellum, suggesting prior hemorrhage at this location (series 10, image 13). No other chronic intracranial blood products. Probable benign arachnoid cyst measuring up to 5.9 cm at the level of the left frontal operculum. Suspected additional smaller arachnoid cyst at the contralateral right temporal pole measuring up to 1.9 cm. No other mass lesion, mass effect, or midline shift. Ventricles normal size without hydrocephalus. No other extra-axial fluid collection. Pituitary gland and suprasellar region within normal limits. Vascular: Major intracranial vascular flow voids are maintained. Skull and upper cervical spine: Craniocervical junction within normal limits. Diffusely heterogeneous and abnormal appearance of visualized bone marrow, presumably related to history of sickle cell anemia. No scalp soft tissue abnormality. Sinuses/Orbits: Globes orbital soft tissues within normal limits. Small volume pneumatized secretions noted within left sphenoid sinus. Paranasal sinuses are otherwise clear. No mastoid effusion. Other: None. IMPRESSION: 1. No acute intracranial abnormality. 2. Atrophy with mild chronic small vessel ischemic  disease, with a few probable tiny remote cerebellar infarcts. 3. Chronic hemosiderin staining at the central aspect of the cerebellum, consistent with prior hemorrhage at this location. 4. Benign arachnoid cysts at the left frontal operculum and likely right temporal pole as above. 5. Diffusely heterogeneous and abnormal appearance of visualized bone marrow, presumably related to history of sickle cell anemia.  Electronically Signed   By: Morene Hoard M.D.   On: 08/07/2023 19:19   CT Knee Left Wo Contrast Result Date: 08/06/2023 CLINICAL DATA:  Bilateral knee trauma, fall EXAM: CT OF THE LEFT KNEE WITHOUT CONTRAST TECHNIQUE: Multidetector CT imaging of the left knee was performed according to the standard protocol. Multiplanar CT image reconstructions were also generated. RADIATION DOSE REDUCTION: This exam was performed according to the departmental dose-optimization program which includes automated exposure control, adjustment of the mA and/or kV according to patient size and/or use of iterative reconstruction technique. COMPARISON:  Radiographs 08/06/2023 FINDINGS: Bones/Joint/Cartilage Moderate knee effusion. 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 This leads to a scalloped mildly depressed superior surface of the medial tibial plateau. Sclerosis in the patella likewise probably the residua from remote bone infarct. Nitrogen gas phenomenon in the medial compartment. Ligaments Suboptimally assessed by CT. Muscles and Tendons Unremarkable Soft tissues Unremarkable IMPRESSION: 1. 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 This leads to a scalloped mildly depressed superior surface of the medial tibial plateau. 2. Moderate knee effusion. 3. Sclerosis in the patella likewise probably the residua from remote bone infarct. Electronically Signed   By: Ryan Salvage M.D.   On: 08/06/2023 13:00   CT Knee Right Wo Contrast Result Date: 08/06/2023 CLINICAL DATA:  Fall, bilateral knee pain and swelling. EXAM: CT OF THE RIGHT KNEE  WITHOUT CONTRAST TECHNIQUE: Multidetector CT imaging of the right knee was performed according to the standard protocol. Multiplanar CT image reconstructions were also generated. RADIATION DOSE REDUCTION: This exam was performed according to the departmental dose-optimization program which includes automated exposure control, adjustment of the mA and/or kV according to patient size and/or use of iterative reconstruction technique. COMPARISON:  Radiographs 08/06/2023 FINDINGS: Bones/Joint/Cartilage Total knee prosthesis observed. No periprosthetic fracture is identified. Bony demineralization observed. Thin rim sclerotic lucent lesion in the proximal fibular head appears chronic and likely incidental. Resurfaced patella noted. Moderate knee joint effusion. Substantial synovitis along the margins of the suprapatellar bursa. Ligaments Suboptimally assessed by CT. Muscles and Tendons Thickened distal quadriceps tendon and patellar tendon. Soft tissues Unremarkable IMPRESSION: 1. Total knee prosthesis without periprosthetic fracture identified. 2. Moderate knee joint effusion with substantial synovitis along the margins of the suprapatellar bursa. 3. Thickened distal quadriceps tendon and patellar tendon. 4. Bony demineralization. Electronically Signed   By: Ryan Salvage M.D.   On: 08/06/2023 12:56   DG Knee Complete 4 Views Left Result Date: 08/06/2023 CLINICAL DATA:  fall EXAM: LEFT KNEE - COMPLETE 4+ VIEW COMPARISON:  12/26/2021 FINDINGS: Old medial tibial plateau fracture deformity with underlying extensive sclerosis, stable. No acute fracture or dislocation. There may be a small suprapatellar effusion. Regional soft tissues unremarkable. IMPRESSION: 1. No acute fracture or other acute findings. 2. Possible small suprapatellar effusion. 3. Chronic medial  tibial plateau fracture deformity. Electronically Signed   By: JONETTA Faes M.D.   On: 08/06/2023 08:43   DG Knee Complete 4 Views Right Result Date:  08/06/2023 CLINICAL DATA:  fall EXAM: RIGHT KNEE - COMPLETE 4+ VIEW COMPARISON:  12/26/2021 FINDINGS: Interval knee arthroplasty, cemented components in expected location. No fracture or dislocation. Small suprapatellar joint effusion. Regional soft tissues unremarkable. IMPRESSION: 1. No fracture or other acute findings. 2. Small joint effusion. Electronically Signed   By: JONETTA Faes M.D.   On: 08/06/2023 08:41   DG Chest 2 View Result Date: 07/19/2023 CLINICAL DATA:  Sickle cell disease.  Status post fall. EXAM: CHEST - 2 VIEW COMPARISON:  06/03/2023 FINDINGS: The heart size is normal. There is no pleural fluid or interstitial edema. No airspace consolidation or pneumothorax. Chronic coarsened interstitial markings with lower lung zone scarring noted. Signs chronic calcified spleen in the left upper quadrant of the abdomen. Right upper quadrant cholecystectomy clips. Bony stigmata of sickle cell disease. Bilateral humeral head AVN, more advanced on the right. IMPRESSION: 1. No acute cardiopulmonary abnormalities. 2. Chronic coarsened interstitial markings with lower lung zone scarring. 3. Bony stigmata of sickle cell disease. Electronically Signed   By: Waddell Calk M.D.   On: 07/19/2023 07:32   CT Lumbar Spine Wo Contrast Result Date: 07/19/2023 EXAM: CT OF THE LUMBAR SPINE WITHOUT CONTRAST 07/19/2023 07:19:00 AM TECHNIQUE: CT of the lumbar spine was performed without the administration of intravenous contrast. Multiplanar reformatted images are provided for review. Automated exposure control, iterative reconstruction, and/or weight based adjustment of the mA/kV was utilized to reduce the radiation dose to as low as reasonably achievable. COMPARISON: None available. CLINICAL HISTORY: Back trauma, no prior imaging (Age >= 16y). Fell in the shower (slipped and fell) yesterday, hit the lower back, and the left shoulder. No LOC. Dizziness. Personal history of sickle cell disease. FINDINGS: BONES AND ALIGNMENT:  5 non-weightbearing lumbar-type vertebral bodies are present. Straightening of the normal lumbar lordosis is present. Leftward curvature is present in the lower lumbar spine. DEGENERATIVE CHANGES: Prominent schmorl's nodes are present at L3-4 and along the superior endplate of L2 and L3. Schmorl's nodes are present at T11-12 and along the inferior endplate of T12. Mild disc bulging is present at L3-4, L4-5, and L5-S1 without focal stenosis. SOFT TISSUES: No paraspinal hematoma. LIMITED RETROPERITONEUM: Limited images of the retroperitoneum demonstrate no acute abnormality. Additional findings: Diffuse osteopenia and endplate changes are consistent with sickle cell disease. No acute fractures are present. IMPRESSION: 1. No acute fractures. 2. Mild disc bulging at L3-4, L4-5, and L5-S1 without focal stenosis. 3. Diffuse osteopenia and endplate changes consistent with sickle cell disease. Electronically signed by: Lonni Necessary MD 07/19/2023 07:31 AM EDT RP Workstation: [id number]   DG Shoulder Left Result Date: 07/19/2023 CLINICAL DATA:  Fall.  Sickle cell pain. EXAM: LEFT SHOULDER - 2+ VIEW COMPARISON:  None Available. FINDINGS: No signs of acute fracture or dislocation. Bony stigmata of sickle cell disease identified. Subchondral sclerosis along the left humeral head is noted compatible with early AVN. Chronic coarsened interstitial markings are noted within the imaged portions of the lungs, also compatible with sickle cell disease. IMPRESSION: 1. No acute findings. 2. Bony stigmata of sickle cell disease. 3. 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 Electronically Signed   By: Waddell Calk M.D.   On: 07/19/2023 07:30    Microbiology: No results found for this or any previous visit (from the past 240 hours).   Labs: Basic Metabolic Panel:  Recent Labs  Lab 08/05/23 1157 08/06/23 0813 08/07/23 1750 08/08/23 0823 08/09/23 1037  NA 138 138 137 137 135  K 5.9* 4.6 5.7* 5.4* 5.6*  CL 119* 105 109 108  106  CO2 15* 23 22 20* 21*  GLUCOSE 75 93 97 99 126*  BUN 47* 50* 43* 42* 49*  CREATININE 3.14* 3.90* 3.35* 2.67* 3.31*  CALCIUM  6.9* 8.7* 8.6* 9.1 8.7*   Liver Function Tests: Recent Labs  Lab 08/05/23 0751 08/06/23 0813 08/07/23 1750  AST 42* 35 30  ALT 37 31 24  ALKPHOS 226* 218* 193*  BILITOT 0.9 1.4* 1.0  PROT 7.4 7.3 6.9  ALBUMIN  3.2* 3.2* 2.7*   No results for input(s): LIPASE, AMYLASE in the last 168 hours. No results for input(s): AMMONIA in the last 168 hours. CBC: Recent Labs  Lab 08/05/23 0751 08/06/23 0813 08/07/23 1750 08/08/23 0612 08/09/23 1037  WBC 10.0 7.1 9.8 8.2 7.5  NEUTROABS 2.3 2.7  --   --   --   HGB 7.9* 7.4* 7.2* 6.6* 8.8*  HCT 23.7* 22.2* 20.1* 20.0* 25.2*  MCV 114.5* 112.7* 111.0* 115.6* 107.2*  PLT 229 225 223 236 262   Cardiac Enzymes: No results for input(s): CKTOTAL, CKMB, CKMBINDEX, TROPONINI in the last 168 hours. BNP: Invalid input(s): POCBNP CBG: Recent Labs  Lab 08/05/23 1326  GLUCAP 162*    Time coordinating discharge: 50 minutes  Signed:  Homer CHRISTELLA Pile [facility] 08/10/2023, 1:01 PM

## 2023-08-09 NOTE — Care Management Important Message (Signed)
 Important Message  Patient Details IM Letter given. Name: [name] [name]. MRN: [id number] Date of Birth: [birth year]   Important Message Given:  Yes - Medicare IM     Melba Ates 08/09/2023, 10:33 AM

## 2023-08-09 NOTE — Telephone Encounter (Signed)
 Copied from CRM [id number]. Topic: General - Other >> Aug 09, 2023 10:30 AM Tiffini S wrote: Reason for CRM: Patient is in the hospital and have requested a medication refill on 07/24/23. Patient wants to talk directly to clinic.

## 2023-08-09 NOTE — Telephone Encounter (Signed)
 Please refuse . Unable to do so. KH

## 2023-08-10 ENCOUNTER — Telehealth: Payer: Self-pay | Admitting: *Deleted

## 2023-08-10 NOTE — Transitions of Care (Post Inpatient/ED Visit) (Signed)
   08/10/2023  Name: [name] [name]. MRN: [id number] DOB: August 07, 1980  Today's TOC FU Call Status: Today's TOC FU Call Status:: Unsuccessful Call (1st Attempt) Unsuccessful Call (1st Attempt) Date: 08/10/23  Attempted to reach the patient regarding the most recent Inpatient/ED visit.  Follow Up Plan: Additional outreach attempts will be made to reach the patient to complete the Transitions of Care (Post Inpatient/ED visit) call.   Mliss Creed [facility], BSN RN Care Manager/ Transition of Care [place] [phone]

## 2023-08-13 ENCOUNTER — Telehealth: Payer: Self-pay | Admitting: *Deleted

## 2023-08-13 NOTE — Transitions of Care (Post Inpatient/ED Visit) (Signed)
   08/13/2023  Name: [name] [name]. MRN: [id number] DOB: 02-05-1981  Today's TOC FU Call Status: Today's TOC FU Call Status:: Successful TOC FU Call Completed TOC FU Call Complete Date: 08/13/23 Patient's Name and Date of Birth confirmed.  Transition Care Management Follow-up Telephone Call Date of Discharge: 08/09/23 Discharge Facility: [facility]) Type of Discharge: Inpatient Admission Primary Inpatient Discharge Diagnosis:: Sickle Cell Anemia with pain How have you been since you were released from the hospital?: Better (pt states  I'm doing real good, don't need anything) Any questions or concerns?: No  Items Reviewed: Did you receive and understand the discharge instructions provided?: Yes Medications obtained,verified, and reconciled?: No Medications Not Reviewed Reasons:: Other: (pt declined) Any new allergies since your discharge?: No Dietary orders reviewed?: No (pt declined) Do you have support at home?:  (unable to ask, pt declined) Briefly spoke with patient, confirmed all follow up appointments, pt states he is doing well and has no needs, declines further follow up, unable to complete review of systems, SDOH  Medications Reviewed Today: Medications Reviewed Today   Medications were not reviewed in this encounter     Home Care and Equipment/Supplies: Were Home Health Services Ordered?: No Any new equipment or medical supplies ordered?: No  Functional Questionnaire: Do you need assistance with bathing/showering or dressing?: No Do you need assistance with meal preparation?: No Do you need assistance with eating?: No Do you have difficulty maintaining continence: No Do you need assistance with getting out of bed/getting out of a chair/moving?: No Do you have difficulty managing or taking your medications?: No  Follow up appointments reviewed: PCP Follow-up appointment confirmed?: Yes Date of PCP follow-up appointment?: 08/16/23 Follow-up  Provider: primary care provider  - Dr. [name]  @ 940 am [facility] Follow-up appointment confirmed?: NA Do you need transportation to your follow-up appointment?: No Do you understand care options if your condition(s) worsen?: Yes-patient verbalized understanding    [name] [name] [facility], BSN RN Care Manager/ Transition of Care [place]/ [facility] [phone]

## 2023-08-16 ENCOUNTER — Ambulatory Visit (INDEPENDENT_AMBULATORY_CARE_PROVIDER_SITE_OTHER): Payer: Self-pay | Admitting: Internal Medicine

## 2023-08-16 ENCOUNTER — Encounter: Payer: Self-pay | Admitting: Internal Medicine

## 2023-08-16 ENCOUNTER — Ambulatory Visit: Payer: Self-pay

## 2023-08-16 ENCOUNTER — Non-Acute Institutional Stay (HOSPITAL_COMMUNITY)
Admit: 2023-08-16 | Discharge: 2023-08-16 | Disposition: A | Discharge status: Home / Self Care | Attending: Internal Medicine | Admitting: Internal Medicine

## 2023-08-16 VITALS — BP 112/84 | HR 101 | Temp 98.2°F | Wt 125.6 lb

## 2023-08-16 DIAGNOSIS — D571 Sickle-cell disease without crisis: Secondary | ICD-10-CM

## 2023-08-16 DIAGNOSIS — G894 Chronic pain syndrome: Secondary | ICD-10-CM | POA: Diagnosis not present

## 2023-08-16 DIAGNOSIS — N184 Chronic kidney disease, stage 4 (severe): Secondary | ICD-10-CM | POA: Diagnosis not present

## 2023-08-16 DIAGNOSIS — F1190 Opioid use, unspecified, uncomplicated: Secondary | ICD-10-CM

## 2023-08-16 DIAGNOSIS — D649 Anemia, unspecified: Secondary | ICD-10-CM

## 2023-08-16 DIAGNOSIS — L0232 Furuncle of buttock: Secondary | ICD-10-CM | POA: Insufficient documentation

## 2023-08-16 MED ORDER — OXYCODONE HCL 10 MG PO TABS: 10.0000 mg | ORAL_TABLET | ORAL | 0 refills | Status: DC

## 2023-08-16 MED ORDER — DOXYCYCLINE HYCLATE 100 MG PO TABS: 100.0000 mg | ORAL_TABLET | Freq: Two times a day (BID) | ORAL | 0 refills | Status: AC

## 2023-08-16 MED ORDER — OXYCODONE HCL ER 15 MG PO T12A: 15.0000 mg | EXTENDED_RELEASE_TABLET | Freq: Two times a day (BID) | ORAL | 0 refills | Status: DC

## 2023-08-16 MED ORDER — DARBEPOETIN ALFA 150 MCG/0.3ML IJ SOSY: 150.0000 ug | PREFILLED_SYRINGE | INTRAMUSCULAR | Status: DC

## 2023-08-16 NOTE — Progress Notes (Signed)
 [name] [name], is a 43 y.o. male  RDW:[id number]  FMW:[id number]  DOB - Jun 02, 1980  Chief Complaint  Patient presents with   Medical Management of Chronic Issues    Follow up       Subjective:   [name] [name] is a 43 y.o. male with medical history significant for sickle cell disease, chronic pain syndrome, opiate dependence and tolerance, anemia of chronic disease, CKD stage IV, cocaine  use disorder who presented here today for a routine follow up visit and medication management.  His major complaint today is around recurrent boil on his buttocks, characterized as itchy and painful.  He currently has 1 on the right.  Patient claims he has completely quit using cocaine , he claims he uses cocaine  for sexual enhancement and not for recreation.  He claims his medications messes up his sexual desire, but cocaine  helps to put him in the mood.  Since being told his opiate prescriptions will stop until he tested negative for cocaine  2 times separated in time, he has decided to quit completely promised himself never to use cocaine  again.  Of note, this is not the first time patient has made this promise.  He claims he is doing well otherwise, no more recurrent falls.  Had any joint swelling or redness.  Patient has No headache, No chest pain, No abdominal pain - No Nausea, No new weakness tingling or numbness, No Cough - SOB.  He has hematology appointment coming up with [facility].  He also continues to follow with nephrologist for his CKD.  Problem  Furuncle of Buttock    ALLERGIES: Allergies  Allergen Reactions   Nsaids Other (See Comments)    Kidney disease   Morphine  And Codeine  Other (See Comments)    Really bad headaches     PAST MEDICAL HISTORY: Past Medical History:  Diagnosis Date   Abscess of right iliac fossa 09/24/2018   required Perc Drain 09/24/2018   Arachnoid Cyst of brain bilaterally    2 really small ones in the back of my head; inside; saw them w/MRI  (09/25/2012)   Bacterial pneumonia ~ 2012   caught it here in the hospital (09/25/2012)   Chronic kidney disease    from my sickle cell (09/25/2012)   CKD (chronic kidney disease), stage II    Colitis 04/19/2017   CT scan abd/ pelvis   GERD (gastroesophageal reflux disease)    after I eat alot of spicey foods (09/25/2012)   Gynecomastia, male 07/10/2012   History of blood transfusion    always related to sickle cell crisis (09/25/2012)   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   Noted in noted from Hematology notes at [facility]   Migraines    take RX qd to prevent them (09/25/2012)   Opioid dependence with withdrawal (HCC) 08/30/2012   Renal insufficiency    Sickle cell anemia (HCC)    Sickle cell crisis (HCC) 09/25/2012   Sickle cell nephropathy (HCC) 07/10/2012   Sinus tachycardia    Tachycardia with heart rate 121-140 beats per minute with ambulation 08/04/2016    MEDICATIONS AT HOME: Prior to Admission medications   Medication Sig Start Date End Date Taking? Authorizing Provider  amitriptyline  (ELAVIL ) 25 MG tablet TAKE 2 TABLETS(50 MG) BY MOUTH AT BEDTIME 05/24/23  Yes Nichols, [name], NP  calcitRIOL  (ROCALTROL ) 0.25 MCG capsule Take 0.25 mcg by mouth daily with breakfast. 11/16/20  Yes [provider]  Deferiprone , Twice Daily, (FERRIPROX  TWICE-A-DAY) 1000 MG TABS Take 4,000 mg by mouth  daily. 05/01/22  Yes Tilford Bertram HERO, FNP  doxycycline  (VIBRA -TABS) 100 MG tablet Take 1 tablet (100 mg total) by mouth 2 (two) times daily for 7 days. 08/16/23 08/23/23 Yes Honey Zakarian E, MD  folic acid  (FOLVITE ) 1 MG tablet TAKE 1 TABLET BY MOUTH EVERY DAY 03/22/20  Yes Hollis, Lachina M, FNP  hydroxyurea  (HYDREA ) 500 MG capsule Take 1 capsule by mouth in the morning on Sun/Tues/Wed/Thurs/Sat and 2 capsules on Mon/Fri 01/25/23  Yes Nichols, [name], NP  sodium bicarbonate  650 MG tablet Take 1,950 mg by mouth 3 (three) times daily. 11/07/18  Yes [provider]   sodium zirconium cyclosilicate  (LOKELMA ) 10 g PACK packet Take 10 g by mouth daily. 04/21/23 04/22/24 Yes [provider]  TYLENOL  8 HOUR ARTHRITIS PAIN 650 MG CR tablet Take 1,300 mg by mouth as needed for pain.   Yes [provider]  oxyCODONE  (OXYCONTIN ) 15 mg 12 hr tablet Take 1 tablet (15 mg) by mouth every 12 hours. 08/16/23   Mylei Brackeen E, MD  Oxycodone  HCl 10 MG TABS Take 1 tablet (10 mg total) by mouth See admin instructions. Take 10 mg by mouth every 6 hours 08/16/23   Kamren Heskett E, MD    Objective:   Vitals:   08/16/23 0948  BP: 112/84  Pulse: (!) 101  Temp: 98.2 F (36.8 C)  TempSrc: Oral  SpO2: 100%  Weight: 125 lb 9.6 oz (57 kg)   Exam General appearance : Awake, alert, not in any distress. Speech Clear. Not toxic looking HEENT: Atraumatic and Normocephalic, pupils equally reactive to light and accomodation Neck: Supple, no JVD. No cervical lymphadenopathy.  Chest: Good air entry bilaterally, no added sounds  CVS: S1 S2 regular, no murmurs.  Abdomen: Bowel sounds present, Non tender and not distended with no gaurding, rigidity or rebound. Extremities: B/L Lower Ext shows no edema, both legs are warm to touch Neurology: Awake alert, and oriented X 3, CN II-XII intact, Non focal Skin: No Rash  Data Review Lab Results  Component Value Date   HGBA1C 8.3 03/07/2023   HGBA1C <4.2 (L) 03/07/2018   HGBA1C <4.0 (L) 03/11/2015    Assessment & Plan   1. Hb-SS disease without crisis (HCC) (Primary)  - Oxycodone  HCl 10 MG TABS; Take 1 tablet (10 mg total) by mouth See admin instructions. Take 10 mg by mouth every 6 hours  Dispense: 60 tablet; Refill: 0 - oxyCODONE  (OXYCONTIN ) 15 mg 12 hr tablet; Take 1 tablet (15 mg) by mouth every 12 hours.  Dispense: 60 tablet; Refill: 0 - CBC - ToxAssure Flex 15, Ur  2. Chronic pain syndrome Patient has been counseled extensively about his use of cocaine .  His urine tested negative this time, making the  second time in the row.  Will give him the benefit of the doubt and restart prescription of his pain medications.  Patient has been warned again that if he test positive for any illicit substance again, we will stop prescribing his opiate completely.  Patient verbalized understanding and was appreciative of the second chance.  Prescribed- Oxycodone  HCl 10 MG TABS; Take 1 tablet (10 mg total) by mouth See admin instructions. Take 10 mg by mouth every 6 hours  Dispense: 60 tablet; Refill: 0  3. CKD (chronic kidney disease), stage IV [facility]): Stable Check labs - Comp Met (CMET) - Vitamin D  (25 hydroxy)  5. Anemia, unspecified type Hemoglobin is stable at baseline 7 days ago. Will check CBC today  6. Furuncle  of buttock  - doxycycline  (VIBRA -TABS) 100 MG tablet; Take 1 tablet (100 mg total) by mouth 2 (two) times daily for 7 days.  Dispense: 14 tablet; Refill: 0  Patient have been counseled extensively about nutrition and exercise. Other issues discussed during this visit include: low cholesterol diet, annual eye examinations at Ophthalmology, importance of adherence with medications and regular follow-up.  Return in about 4 weeks (around 09/13/2023) for Follow up Pain and comorbidities, CKD/ESRD, Sickle Cell Disease/Pain.  The patient was given clear instructions to go to ER or return to medical center if symptoms don't improve, worsen or new problems develop. The patient verbalized understanding. The patient was told to call to get lab results if they haven't heard anything in the next week.   This note has been created with Education officer, environmental. Any transcriptional errors are unintentional.    Precious Schatz, MD, MHA, GENI BEL, CPE [facility] [place], [place] [phone]   08/16/2023, 10:19 AM

## 2023-08-17 LAB — COMPREHENSIVE METABOLIC PANEL WITH GFR
ALT: 55 IU/L — ABNORMAL HIGH (ref 0–44)
AST: 65 IU/L — ABNORMAL HIGH (ref 0–40)
Albumin: 3.9 g/dL — ABNORMAL LOW (ref 4.1–5.1)
Alkaline Phosphatase: 296 IU/L — ABNORMAL HIGH (ref 44–121)
BUN/Creatinine Ratio: 16 (ref 9–20)
BUN: 48 mg/dL — ABNORMAL HIGH (ref 6–24)
Bilirubin Total: 0.8 mg/dL (ref 0.0–1.2)
CO2: 20 mmol/L (ref 20–29)
Calcium: 8.6 mg/dL — ABNORMAL LOW (ref 8.7–10.2)
Chloride: 104 mmol/L (ref 96–106)
Creatinine, Ser: 2.99 mg/dL — ABNORMAL HIGH (ref 0.76–1.27)
Globulin, Total: 3.2 g/dL (ref 1.5–4.5)
Glucose: 80 mg/dL (ref 70–99)
Potassium: 5 mmol/L (ref 3.5–5.2)
Sodium: 139 mmol/L (ref 134–144)
Total Protein: 7.1 g/dL (ref 6.0–8.5)
eGFR: 26 mL/min/1.73 — ABNORMAL LOW (ref >59)

## 2023-08-17 LAB — CBC
Hematocrit: 27.6 % — ABNORMAL LOW (ref 37.5–51.0)
Hemoglobin: 9.5 g/dL — ABNORMAL LOW (ref 13.0–17.7)
MCH: 38.3 pg — ABNORMAL HIGH (ref 26.6–33.0)
MCHC: 34.4 g/dL (ref 31.5–35.7)
MCV: 111 fL — ABNORMAL HIGH (ref 79–97)
Platelets: 240 x10E3/uL (ref 150–450)
RBC: 2.48 x10E6/uL — CL (ref 4.14–5.80)
RDW: 28.4 % — ABNORMAL HIGH (ref 11.6–15.4)
WBC: 9.7 x10E3/uL (ref 3.4–10.8)

## 2023-08-17 LAB — VITAMIN D 25 HYDROXY (VIT D DEFICIENCY, FRACTURES): Vit D, 25-Hydroxy: 13.9 ng/mL — ABNORMAL LOW (ref 30.0–100.0)

## 2023-08-20 ENCOUNTER — Ambulatory Visit (INDEPENDENT_AMBULATORY_CARE_PROVIDER_SITE_OTHER): Payer: Self-pay | Admitting: Nurse Practitioner

## 2023-08-20 VITALS — BP 120/79 | HR 113 | Temp 98.6°F | Wt 128.0 lb

## 2023-08-20 DIAGNOSIS — D571 Sickle-cell disease without crisis: Secondary | ICD-10-CM

## 2023-08-20 DIAGNOSIS — Z113 Encounter for screening for infections with a predominantly sexual mode of transmission: Secondary | ICD-10-CM | POA: Diagnosis not present

## 2023-08-20 NOTE — Progress Notes (Signed)
 Subjective   Patient ID: [name] [name]., male    DOB: 1980/06/01, 43 y.o.   MRN: [id number]  Chief Complaint  Patient presents with   Hospitalization Follow-up    Follow up    Referring provider: [name], NP  [name] [name]. is a 43 y.o. male with Past Medical History: 09/24/2018: Abscess of right iliac fossa     Comment:  required Perc Drain 09/24/2018 No date: Arachnoid Cyst of brain bilaterally     Comment:  2 really small ones in the back of my head; inside; saw              them w/MRI (09/25/2012) ~ 2012: Bacterial pneumonia     Comment:  caught it here in the hospital (09/25/2012) No date: Chronic kidney disease     Comment:  from my sickle cell (09/25/2012) No date: CKD (chronic kidney disease), stage II 04/19/2017: Colitis     Comment:  CT scan abd/ pelvis No date: GERD (gastroesophageal reflux disease)     Comment:  after I eat alot of spicey foods (09/25/2012) 07/10/2012: Gynecomastia, male No date: History of blood transfusion     Comment:  always related to sickle cell crisis (09/25/2012) 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     Comment:  Noted in noted from Hematology notes at [facility] No date: Migraines     Comment:  take RX qd to prevent them (09/25/2012) 08/30/2012: Opioid dependence with withdrawal (HCC) No date: Renal insufficiency No date: Sickle cell anemia (HCC) 09/25/2012: Sickle cell crisis (HCC) 07/10/2012: Sickle cell nephropathy (HCC) No date: Sinus tachycardia 08/04/2016: Tachycardia with heart rate 121-140 beats per minute with  ambulation   HPI  Patient presents today for hospital follow-up.  He was recently in the hospital.  Since that time he has followed up with Dr. [name].  Patient has tested positive for cocaine  and we have discussed that he will not be able to get controlled substances due to cocaine  use.  Patient states that he has stopped using cocaine  and will not use it  again.  Will continue to monitor.  Patient is requesting STD screening today.  Denies f/c/s, n/v/d, hemoptysis, PND, leg swelling Denies chest pain or edema      Allergies  Allergen Reactions   Nsaids Other (See Comments)    Kidney disease   Morphine  And Codeine  Other (See Comments)    Really bad headaches     Immunization History  Administered Date(s) Administered   Influenza Split 12/08/2011   Influenza,inj,Quad PF,6+ Mos 10/11/2012, 12/13/2016, 11/15/2017, 01/06/2019, 01/28/2019, 01/13/2020, 12/15/2020, 01/24/2022   Influenza-Unspecified 10/11/2012, 12/13/2016   Moderna Sars-Covid-2 Vaccination 05/29/2019, 06/26/2019   Pneumococcal Conjugate-13 08/23/2017   Pneumococcal Polysaccharide-23 12/13/2016   Td 09/18/2004   Tdap 06/13/2017    Tobacco History: Social History   Tobacco Use  Smoking Status Former   Current packs/day: 0.10   Types: Cigarettes  Smokeless Tobacco Never   Counseling given: Not Answered   Outpatient Encounter Medications as of 08/20/2023  Medication Sig   amitriptyline  (ELAVIL ) 25 MG tablet TAKE 2 TABLETS(50 MG) BY MOUTH AT BEDTIME   calcitRIOL  (ROCALTROL ) 0.25 MCG capsule Take 0.25 mcg by mouth daily with breakfast.   Deferiprone , Twice Daily, (FERRIPROX  TWICE-A-DAY) 1000 MG TABS Take 4,000 mg by mouth daily.   doxycycline  (VIBRA -TABS) 100 MG tablet Take 1 tablet (100 mg total) by mouth 2 (two) times daily  for 7 days.   folic acid  (FOLVITE ) 1 MG tablet TAKE 1 TABLET BY MOUTH EVERY DAY   hydroxyurea  (HYDREA ) 500 MG capsule Take 1 capsule by mouth in the morning on Sun/Tues/Wed/Thurs/Sat and 2 capsules on Mon/Fri   oxyCODONE  (OXYCONTIN ) 15 mg 12 hr tablet Take 1 tablet (15 mg) by mouth every 12 hours.   Oxycodone  HCl 10 MG TABS Take 1 tablet (10 mg total) by mouth See admin instructions. Take 10 mg by mouth every 6 hours   sodium bicarbonate  650 MG tablet Take 1,950 mg by mouth 3 (three) times daily.   sodium zirconium cyclosilicate  (LOKELMA ) 10 g  PACK packet Take 10 g by mouth daily.   TYLENOL  8 HOUR ARTHRITIS PAIN 650 MG CR tablet Take 1,300 mg by mouth as needed for pain.   No facility-administered encounter medications on file as of 08/20/2023.    Review of Systems  Review of Systems  Constitutional: Negative.   HENT: Negative.    Cardiovascular: Negative.   Gastrointestinal: Negative.   Allergic/Immunologic: Negative.   Neurological: Negative.   Psychiatric/Behavioral: Negative.       Objective:   BP 120/79   Pulse (!) 113   Temp 98.6 F (37 C) (Oral)   Wt 128 lb (58.1 kg)   SpO2 97%   BMI 22.67 kg/m   Wt Readings from Last 5 Encounters:  08/20/23 128 lb (58.1 kg)  08/16/23 125 lb 9.6 oz (57 kg)  08/06/23 130 lb 1.1 oz (59 kg)  08/05/23 130 lb (59 kg)  07/10/23 130 lb (59 kg)     Physical Exam Vitals and nursing note reviewed.  Constitutional:      General: He is not in acute distress.    Appearance: He is well-developed.  Cardiovascular:     Rate and Rhythm: Normal rate and regular rhythm.  Pulmonary:     Effort: Pulmonary effort is normal.     Breath sounds: Normal breath sounds.  Skin:    General: Skin is warm and dry.  Neurological:     Mental Status: He is alert and oriented to person, place, and time.       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   Bascom GORMAN Borer, NP 08/20/2023

## 2023-08-21 ENCOUNTER — Encounter (HOSPITAL_COMMUNITY)

## 2023-08-22 ENCOUNTER — Ambulatory Visit: Payer: Self-pay | Admitting: Nurse Practitioner

## 2023-08-22 LAB — CBC
Hematocrit: 25.3 % — ABNORMAL LOW (ref 37.5–51.0)
Hemoglobin: 8.3 g/dL — ABNORMAL LOW (ref 13.0–17.7)
MCH: 37.1 pg — ABNORMAL HIGH (ref 26.6–33.0)
MCHC: 32.8 g/dL (ref 31.5–35.7)
MCV: 113 fL — ABNORMAL HIGH (ref 79–97)
Platelets: 166 x10E3/uL (ref 150–450)
RBC: 2.24 x10E6/uL — CL (ref 4.14–5.80)
WBC: 8.5 x10E3/uL (ref 3.4–10.8)

## 2023-08-22 LAB — TOXASSURE FLEX 15, UR
6-ACETYLMORPHINE IA: NEGATIVE ng/mL
7-aminoclonazepam: NOT DETECTED ng/mg{creat}
AMPHETAMINES IA: NEGATIVE ng/mL
Alpha-hydroxyalprazolam: NOT DETECTED ng/mg{creat}
Alpha-hydroxymidazolam: NOT DETECTED ng/mg{creat}
Alpha-hydroxytriazolam: NOT DETECTED ng/mg{creat}
Alprazolam: NOT DETECTED ng/mg{creat}
BARBITURATES IA: NEGATIVE ng/mL
Buprenorphine: NOT DETECTED ng/mg{creat}
CANNABINOIDS IA: NEGATIVE ng/mL
COCAINE METABOLITE IA: NEGATIVE ng/mL
Clonazepam: NOT DETECTED ng/mg{creat}
Creatinine: 106 mg/dL (ref >=20)
Desalkylflurazepam: NOT DETECTED ng/mg{creat}
Desmethyldiazepam: NOT DETECTED ng/mg{creat}
Desmethylflunitrazepam: NOT DETECTED ng/mg{creat}
Diazepam: NOT DETECTED ng/mg{creat}
ETHYL ALCOHOL Enzymatic: POSITIVE g/dL
Fentanyl: NOT DETECTED ng/mg{creat}
Flunitrazepam: NOT DETECTED ng/mg{creat}
Lorazepam: NOT DETECTED ng/mg{creat}
METHADONE IA: NEGATIVE ng/mL
METHADONE MTB IA: NEGATIVE ng/mL
Midazolam: NOT DETECTED ng/mg{creat}
Norbuprenorphine: NOT DETECTED ng/mg{creat}
Norfentanyl: NOT DETECTED ng/mg{creat}
Oxazepam: NOT DETECTED ng/mg{creat}
PHENCYCLIDINE IA: NEGATIVE ng/mL
TAPENTADOL, IA: NEGATIVE ng/mL
TRAMADOL IA: NEGATIVE ng/mL
Temazepam: NOT DETECTED ng/mg{creat}

## 2023-08-22 LAB — COMPREHENSIVE METABOLIC PANEL WITH GFR
ALT: 30 IU/L (ref 0–44)
AST: 31 IU/L (ref 0–40)
Albumin: 3.8 g/dL — ABNORMAL LOW (ref 4.1–5.1)
Alkaline Phosphatase: 280 IU/L — ABNORMAL HIGH (ref 44–121)
BUN/Creatinine Ratio: 16 (ref 9–20)
BUN: 51 mg/dL — ABNORMAL HIGH (ref 6–24)
Bilirubin Total: 1 mg/dL (ref 0.0–1.2)
CO2: 19 mmol/L — ABNORMAL LOW (ref 20–29)
Calcium: 8.6 mg/dL — ABNORMAL LOW (ref 8.7–10.2)
Chloride: 99 mmol/L (ref 96–106)
Creatinine, Ser: 3.28 mg/dL — ABNORMAL HIGH (ref 0.76–1.27)
Globulin, Total: 3.2 g/dL (ref 1.5–4.5)
Glucose: 95 mg/dL (ref 70–99)
Potassium: 4 mmol/L (ref 3.5–5.2)
Sodium: 136 mmol/L (ref 134–144)
Total Protein: 7 g/dL (ref 6.0–8.5)
eGFR: 23 mL/min/1.73 — ABNORMAL LOW (ref >59)

## 2023-08-22 LAB — OPIATE CLASS, MS, UR RFX
Codeine: NOT DETECTED ng/mg{creat}
Dihydrocodeine: NOT DETECTED ng/mg{creat}
Hydrocodone: NOT DETECTED ng/mg{creat}
Hydromorphone: NOT DETECTED ng/mg{creat}
Morphine: NOT DETECTED ng/mg{creat}
Norcodeine: NOT DETECTED ng/mg{creat}
Norhydrocodone: NOT DETECTED ng/mg{creat}
Normorphine: NOT DETECTED ng/mg{creat}
Opiate Class Confirmation: NEGATIVE

## 2023-08-22 LAB — RPR+HIV+GC+CT PANEL
HIV Screen 4th Generation wRfx: NONREACTIVE
RPR Ser Ql: NONREACTIVE

## 2023-08-22 LAB — ALCOHOL, ETHYL, UR, QT
Alcohol, Ethyl: 0.065 g/dL
Alcohol, Ethyl: POSITIVE

## 2023-08-22 LAB — OXYCODONE CLASS, MS, UR RFX
Noroxycodone: 1454 ng/mg{creat}
Noroxymorphone: 1549 ng/mg{creat}
Oxycodone Class Confirmation: POSITIVE
Oxycodone: 674 ng/mg{creat}
Oxymorphone: 1237 ng/mg{creat}

## 2023-08-22 LAB — CHLAMYDIA/GONOCOCCUS/TRICHOMONAS, NAA
Chlamydia by NAA: NEGATIVE
Gonococcus by NAA: NEGATIVE
Trich vag by NAA: NEGATIVE

## 2023-08-28 ENCOUNTER — Other Ambulatory Visit: Payer: Self-pay | Admitting: Nurse Practitioner

## 2023-08-28 DIAGNOSIS — G4700 Insomnia, unspecified: Secondary | ICD-10-CM

## 2023-08-28 NOTE — Telephone Encounter (Signed)
 Please advise [facility]

## 2023-09-04 ENCOUNTER — Non-Acute Institutional Stay (HOSPITAL_COMMUNITY)
Admission: RE | Admit: 2023-09-04 | Discharge: 2023-09-04 | Disposition: A | Discharge status: Home / Self Care | Source: Ambulatory Visit | Attending: Internal Medicine | Admitting: Internal Medicine

## 2023-09-04 ENCOUNTER — Other Ambulatory Visit: Payer: Self-pay | Admitting: Nurse Practitioner

## 2023-09-04 DIAGNOSIS — D571 Sickle-cell disease without crisis: Secondary | ICD-10-CM

## 2023-09-04 DIAGNOSIS — G894 Chronic pain syndrome: Secondary | ICD-10-CM

## 2023-09-04 DIAGNOSIS — D638 Anemia in other chronic diseases classified elsewhere: Secondary | ICD-10-CM

## 2023-09-04 MED ORDER — DARBEPOETIN ALFA 150 MCG/0.3ML IJ SOSY: 150.0000 ug | PREFILLED_SYRINGE | Freq: Once | INTRAMUSCULAR | Status: DC

## 2023-09-04 NOTE — Telephone Encounter (Unsigned)
 Copied from CRM [id number]. Topic: Clinical - Medication Refill >> Sep 04, 2023  8:59 AM Larissa S wrote: Medication:  Oxycodone  HCl 10 MG TABS Has the patient contacted their pharmacy? Yes (Agent: If no, request that the patient contact the pharmacy for the refill. If patient does not wish to contact the pharmacy document the reason why and proceed with request.) (Agent: If yes, when and what did the pharmacy advise?)  This is the patient's preferred pharmacy:  [facility] [address] [place] - [address] [place] [address] Phone: [phone] Fax: [phone]    Is this the correct pharmacy for this prescription? Yes If no, delete pharmacy and type the correct one.   Has the prescription been filled recently? Yes  Is the patient out of the medication? Yes  Has the patient been seen for an appointment in the last year OR does the patient have an upcoming appointment? Yes  Can we respond through MyChart? Yes  Agent: Please be advised that Rx refills may take up to 3 business days. We ask that you follow-up with your pharmacy.

## 2023-09-05 ENCOUNTER — Non-Acute Institutional Stay (HOSPITAL_COMMUNITY)
Admission: RE | Admit: 2023-09-05 | Discharge: 2023-09-05 | Disposition: A | Discharge status: Home / Self Care | Source: Ambulatory Visit | Attending: Internal Medicine | Admitting: Internal Medicine

## 2023-09-05 ENCOUNTER — Other Ambulatory Visit: Payer: Self-pay | Admitting: Nurse Practitioner

## 2023-09-05 DIAGNOSIS — N189 Chronic kidney disease, unspecified: Secondary | ICD-10-CM | POA: Diagnosis not present

## 2023-09-05 DIAGNOSIS — D638 Anemia in other chronic diseases classified elsewhere: Secondary | ICD-10-CM | POA: Insufficient documentation

## 2023-09-05 DIAGNOSIS — D571 Sickle-cell disease without crisis: Secondary | ICD-10-CM | POA: Insufficient documentation

## 2023-09-05 DIAGNOSIS — D631 Anemia in chronic kidney disease: Secondary | ICD-10-CM | POA: Insufficient documentation

## 2023-09-05 LAB — COMPREHENSIVE METABOLIC PANEL WITH GFR
ALT: 94 U/L — ABNORMAL HIGH (ref 0–44)
AST: 144 U/L — ABNORMAL HIGH (ref 15–41)
Albumin: 3.3 g/dL — ABNORMAL LOW (ref 3.5–5.0)
Alkaline Phosphatase: 221 U/L — ABNORMAL HIGH (ref 38–126)
Anion gap: 13 (ref 5–15)
BUN: 60 mg/dL — ABNORMAL HIGH (ref 6–20)
CO2: 27 mmol/L (ref 22–32)
Calcium: 9.2 mg/dL (ref 8.9–10.3)
Chloride: 100 mmol/L (ref 98–111)
Creatinine, Ser: 3.44 mg/dL — ABNORMAL HIGH (ref 0.61–1.24)
GFR, Estimated: 22 mL/min — ABNORMAL LOW (ref >60)
Glucose, Bld: 91 mg/dL (ref 70–99)
Potassium: 4.6 mmol/L (ref 3.5–5.1)
Sodium: 140 mmol/L (ref 135–145)
Total Bilirubin: 1.2 mg/dL (ref 0.0–1.2)
Total Protein: 7.8 g/dL (ref 6.5–8.1)

## 2023-09-05 LAB — RETICULOCYTES
Immature Retic Fract: 31.3 % — ABNORMAL HIGH (ref 2.3–15.9)
RBC.: 1.87 MIL/uL — ABNORMAL LOW (ref 4.22–5.81)
Retic Count, Absolute: 89.9 K/uL (ref 19.0–186.0)
Retic Ct Pct: 4.8 % — ABNORMAL HIGH (ref 0.4–3.1)

## 2023-09-05 LAB — CBC
HCT: 21.1 % — ABNORMAL LOW (ref 39.0–52.0)
Hemoglobin: 7.1 g/dL — ABNORMAL LOW (ref 13.0–17.0)
MCH: 38.2 pg — ABNORMAL HIGH (ref 26.0–34.0)
MCHC: 33.6 g/dL (ref 30.0–36.0)
MCV: 113.4 fL — ABNORMAL HIGH (ref 80.0–100.0)
Platelets: 346 K/uL (ref 150–400)
RBC: 1.86 MIL/uL — ABNORMAL LOW (ref 4.22–5.81)
RDW: 32 % — ABNORMAL HIGH (ref 11.5–15.5)
WBC: 7.4 K/uL (ref 4.0–10.5)
nRBC: 1.6 % — ABNORMAL HIGH (ref 0.0–0.2)

## 2023-09-05 LAB — LACTATE DEHYDROGENASE: LDH: 160 U/L (ref 98–192)

## 2023-09-05 MED ORDER — DARBEPOETIN ALFA 100 MCG/0.5ML IJ SOSY: 150.0000 ug | PREFILLED_SYRINGE | Freq: Once | INTRAMUSCULAR | Status: AC

## 2023-09-05 MED ORDER — OXYCODONE HCL 10 MG PO TABS: 10.0000 mg | ORAL_TABLET | ORAL | 0 refills | Status: DC

## 2023-09-05 MED ORDER — DARBEPOETIN ALFA 150 MCG/0.3ML IJ SOSY
150.0000 ug | PREFILLED_SYRINGE | Freq: Once | INTRAMUSCULAR | Status: AC
  Administered 2023-09-05: 150 ug via SUBCUTANEOUS
  Filled 2023-09-05: qty 0.3

## 2023-09-05 NOTE — Progress Notes (Signed)
 Patient Care Center Note    Diagnosis: Anemia of Chronic Disease  Provider: Cherylene Homer HERO, NP   Procedure: Darbepoetin Alfa  (ARANESP ) injection 150mcg     Note: Pt came in today to receive aranesp  injection. Advised by the provider to give aranesp  if hemoglobin is less than 10. Labs were drawn and hemoglobin resulted at 7.1. Provider notified and aranesp  was administered. Pt declined AVS. Vitals were stable and pt was alert oriented and ambulatory upon discharge. Appointment was scheduled for next injection on August 6th.

## 2023-09-14 ENCOUNTER — Telehealth: Payer: Self-pay

## 2023-09-14 NOTE — Telephone Encounter (Signed)
 Copied from CRM [id number]. Topic: General - Other >> Sep 14, 2023 12:07 PM Fonda T wrote: Reason for CRM: Received call from Randine with [facility], requesting diagnosis codes for medical necessity for labs that were collected/drawn on 04/26/2023.  Can be reached at [phone],  Ref# [id number] to advise/discuss further.  Lvm for a fax be sent over for more info  concerning the dx. KH

## 2023-09-19 ENCOUNTER — Non-Acute Institutional Stay (HOSPITAL_COMMUNITY)
Admission: RE | Admit: 2023-09-19 | Discharge: 2023-09-19 | Disposition: A | Discharge status: Home / Self Care | Source: Ambulatory Visit | Attending: Internal Medicine | Admitting: Internal Medicine

## 2023-09-19 VITALS — BP 102/64 | HR 74 | Temp 97.6°F | Resp 16

## 2023-09-19 DIAGNOSIS — D638 Anemia in other chronic diseases classified elsewhere: Secondary | ICD-10-CM | POA: Insufficient documentation

## 2023-09-19 DIAGNOSIS — N189 Chronic kidney disease, unspecified: Secondary | ICD-10-CM | POA: Diagnosis not present

## 2023-09-19 DIAGNOSIS — D5709 Hb-ss disease with crisis with other specified complication: Secondary | ICD-10-CM | POA: Diagnosis present

## 2023-09-19 DIAGNOSIS — D631 Anemia in chronic kidney disease: Secondary | ICD-10-CM | POA: Insufficient documentation

## 2023-09-19 DIAGNOSIS — D5700 Hb-SS disease with crisis, unspecified: Secondary | ICD-10-CM

## 2023-09-19 LAB — CBC WITH DIFFERENTIAL/PLATELET
Abs Immature Granulocytes: 0.02 K/uL (ref 0.00–0.07)
Basophils Absolute: 0 K/uL (ref 0.0–0.1)
Basophils Relative: 0 %
Eosinophils Absolute: 0 K/uL (ref 0.0–0.5)
Eosinophils Relative: 1 %
HCT: 22.5 % — ABNORMAL LOW (ref 39.0–52.0)
Hemoglobin: 7.6 g/dL — ABNORMAL LOW (ref 13.0–17.0)
Immature Granulocytes: 0 %
Lymphocytes Relative: 44 %
Lymphs Abs: 3 K/uL (ref 0.7–4.0)
MCH: 40.9 pg — ABNORMAL HIGH (ref 26.0–34.0)
MCHC: 33.8 g/dL (ref 30.0–36.0)
MCV: 121 fL — ABNORMAL HIGH (ref 80.0–100.0)
Monocytes Absolute: 0.5 K/uL (ref 0.1–1.0)
Monocytes Relative: 7 %
Neutro Abs: 3.3 K/uL (ref 1.7–7.7)
Neutrophils Relative %: 48 %
Platelets: 215 K/uL (ref 150–400)
RBC: 1.86 MIL/uL — ABNORMAL LOW (ref 4.22–5.81)
RDW: 30.5 % — ABNORMAL HIGH (ref 11.5–15.5)
WBC: 6.9 K/uL (ref 4.0–10.5)
nRBC: 0.7 % — ABNORMAL HIGH (ref 0.0–0.2)

## 2023-09-19 LAB — COMPREHENSIVE METABOLIC PANEL WITH GFR
ALT: 68 U/L — ABNORMAL HIGH (ref 0–44)
AST: 98 U/L — ABNORMAL HIGH (ref 15–41)
Albumin: 3.2 g/dL — ABNORMAL LOW (ref 3.5–5.0)
Alkaline Phosphatase: 206 U/L — ABNORMAL HIGH (ref 38–126)
Anion gap: 14 (ref 5–15)
BUN: 54 mg/dL — ABNORMAL HIGH (ref 6–20)
CO2: 26 mmol/L (ref 22–32)
Calcium: 8.8 mg/dL — ABNORMAL LOW (ref 8.9–10.3)
Chloride: 100 mmol/L (ref 98–111)
Creatinine, Ser: 2.96 mg/dL — ABNORMAL HIGH (ref 0.61–1.24)
GFR, Estimated: 27 mL/min — ABNORMAL LOW (ref >60)
Glucose, Bld: 101 mg/dL — ABNORMAL HIGH (ref 70–99)
Potassium: 4.4 mmol/L (ref 3.5–5.1)
Sodium: 140 mmol/L (ref 135–145)
Total Bilirubin: 1.2 mg/dL (ref 0.0–1.2)
Total Protein: 7.2 g/dL (ref 6.5–8.1)

## 2023-09-19 LAB — RETICULOCYTES
Immature Retic Fract: 24.3 % — ABNORMAL HIGH (ref 2.3–15.9)
RBC.: 1.85 MIL/uL — ABNORMAL LOW (ref 4.22–5.81)
Retic Count, Absolute: 164.5 K/uL (ref 19.0–186.0)
Retic Ct Pct: 8.9 % — ABNORMAL HIGH (ref 0.4–3.1)

## 2023-09-19 LAB — LACTATE DEHYDROGENASE: LDH: 162 U/L (ref 98–192)

## 2023-09-19 MED ORDER — DARBEPOETIN ALFA 150 MCG/0.3ML IJ SOSY
150.0000 ug | PREFILLED_SYRINGE | Freq: Once | INTRAMUSCULAR | Status: AC
  Administered 2023-09-19: 150 ug via SUBCUTANEOUS
  Filled 2023-09-19: qty 0.3

## 2023-09-19 NOTE — Progress Notes (Signed)
 PATIENT CARE CENTER NOTE   Diagnosis: anemia of chronic disease     Provider: Homer Cover FNP     Procedure: Aranesp  injection 150mcg and lab draw     Note: Patient labs drawn (CBC w diff, CMP, LDH, Reticulocytes) and patient received sub-q Aranesp  injection in right arm. Pre-injection hemoglobin was 7.6; orders indicate to administer injection when hgb is under 11. Patient tolerated injection well. Vital signs wnl.  Pt declined printed AVS. Instructed to schedule next injection in 2 weeks at front desk prior to leaving, pt verbalized understanding. Pt alert, oriented and ambulatory at discharge.

## 2023-09-25 ENCOUNTER — Other Ambulatory Visit: Payer: Self-pay | Admitting: Nurse Practitioner

## 2023-09-25 DIAGNOSIS — D571 Sickle-cell disease without crisis: Secondary | ICD-10-CM

## 2023-09-25 DIAGNOSIS — G894 Chronic pain syndrome: Secondary | ICD-10-CM

## 2023-09-25 NOTE — Telephone Encounter (Signed)
 Pt has only had one drug screen 08/16/23 and pt was seen by Bascom 08/20/23 . Next appt is 10/22/23 with Tonya . Please advise . Thank you. KH

## 2023-09-25 NOTE — Telephone Encounter (Signed)
 Copied from CRM [id number]. Topic: Clinical - Medication Refill >> Sep 25, 2023  9:08 AM Wess RAMAN wrote: Medication: oxyCODONE  (OXYCONTIN ) 15 mg 12 hr tablet  Oxycodone  HCl 10 MG TABS    Has the patient contacted their pharmacy? No (Agent: If no, request that the patient contact the pharmacy for the refill. If patient does not wish to contact the pharmacy document the reason why and proceed with request.) (Agent: If yes, when and what did the pharmacy advise?)  This is the patient's preferred pharmacy:  [facility] [address] [place] - [address] [place] [address] Phone: [phone] Fax: [phone]   Is this the correct pharmacy for this prescription? Yes If no, delete pharmacy and type the correct one.   Has the prescription been filled recently? Yes  Is the patient out of the medication? Yes  Has the patient been seen for an appointment in the last year OR does the patient have an upcoming appointment? Yes  Can we respond through MyChart? Yes  Agent: Please be advised that Rx refills may take up to 3 business days. We ask that you follow-up with your pharmacy.

## 2023-09-26 ENCOUNTER — Non-Acute Institutional Stay (HOSPITAL_COMMUNITY)
Admission: AD | Admit: 2023-09-26 | Discharge: 2023-09-26 | Disposition: A | Discharge status: Home / Self Care | Source: Ambulatory Visit | Attending: Internal Medicine | Admitting: Internal Medicine

## 2023-09-26 ENCOUNTER — Telehealth (HOSPITAL_COMMUNITY): Payer: Self-pay | Admitting: *Deleted

## 2023-09-26 ENCOUNTER — Other Ambulatory Visit: Payer: Self-pay

## 2023-09-26 DIAGNOSIS — R1011 Right upper quadrant pain: Secondary | ICD-10-CM

## 2023-09-26 DIAGNOSIS — D5700 Hb-SS disease with crisis, unspecified: Secondary | ICD-10-CM | POA: Diagnosis present

## 2023-09-26 DIAGNOSIS — N184 Chronic kidney disease, stage 4 (severe): Secondary | ICD-10-CM

## 2023-09-26 DIAGNOSIS — Z79899 Other long term (current) drug therapy: Secondary | ICD-10-CM | POA: Insufficient documentation

## 2023-09-26 DIAGNOSIS — F1120 Opioid dependence, uncomplicated: Secondary | ICD-10-CM | POA: Diagnosis not present

## 2023-09-26 DIAGNOSIS — G894 Chronic pain syndrome: Secondary | ICD-10-CM | POA: Diagnosis not present

## 2023-09-26 DIAGNOSIS — D631 Anemia in chronic kidney disease: Secondary | ICD-10-CM | POA: Diagnosis not present

## 2023-09-26 LAB — CBC WITH DIFFERENTIAL/PLATELET
Abs Immature Granulocytes: 0.02 K/uL (ref 0.00–0.07)
Basophils Absolute: 0 K/uL (ref 0.0–0.1)
Basophils Relative: 0 %
Eosinophils Absolute: 0 K/uL (ref 0.0–0.5)
Eosinophils Relative: 0 %
HCT: 23 % — ABNORMAL LOW (ref 39.0–52.0)
Hemoglobin: 8 g/dL — ABNORMAL LOW (ref 13.0–17.0)
Immature Granulocytes: 0 %
Lymphocytes Relative: 49 %
Lymphs Abs: 3.6 K/uL (ref 0.7–4.0)
MCH: 43.2 pg — ABNORMAL HIGH (ref 26.0–34.0)
MCHC: 34.8 g/dL (ref 30.0–36.0)
MCV: 124.3 fL — ABNORMAL HIGH (ref 80.0–100.0)
Monocytes Absolute: 0.3 K/uL (ref 0.1–1.0)
Monocytes Relative: 4 %
Neutro Abs: 3.6 K/uL (ref 1.7–7.7)
Neutrophils Relative %: 47 %
Platelets: 161 K/uL (ref 150–400)
RBC: 1.85 MIL/uL — ABNORMAL LOW (ref 4.22–5.81)
RDW: 27.2 % — ABNORMAL HIGH (ref 11.5–15.5)
Smear Review: NORMAL
WBC: 7.6 K/uL (ref 4.0–10.5)
nRBC: 0.9 % — ABNORMAL HIGH (ref 0.0–0.2)

## 2023-09-26 LAB — COMPREHENSIVE METABOLIC PANEL WITH GFR
ALT: 45 U/L — ABNORMAL HIGH (ref 0–44)
AST: 71 U/L — ABNORMAL HIGH (ref 15–41)
Albumin: 3 g/dL — ABNORMAL LOW (ref 3.5–5.0)
Alkaline Phosphatase: 178 U/L — ABNORMAL HIGH (ref 38–126)
Anion gap: 10 (ref 5–15)
BUN: 40 mg/dL — ABNORMAL HIGH (ref 6–20)
CO2: 25 mmol/L (ref 22–32)
Calcium: 7.9 mg/dL — ABNORMAL LOW (ref 8.9–10.3)
Chloride: 93 mmol/L — ABNORMAL LOW (ref 98–111)
Creatinine, Ser: 2.94 mg/dL — ABNORMAL HIGH (ref 0.61–1.24)
GFR, Estimated: 27 mL/min — ABNORMAL LOW (ref >60)
Glucose, Bld: 79 mg/dL (ref 70–99)
Potassium: 4.3 mmol/L (ref 3.5–5.1)
Sodium: 128 mmol/L — ABNORMAL LOW (ref 135–145)
Total Bilirubin: 1.3 mg/dL — ABNORMAL HIGH (ref 0.0–1.2)
Total Protein: 6.9 g/dL (ref 6.5–8.1)

## 2023-09-26 LAB — RETICULOCYTES
Immature Retic Fract: 25.7 % — ABNORMAL HIGH (ref 2.3–15.9)
RBC.: 1.83 MIL/uL — ABNORMAL LOW (ref 4.22–5.81)
Retic Count, Absolute: 77 K/uL (ref 19.0–186.0)
Retic Ct Pct: 4.2 % — ABNORMAL HIGH (ref 0.4–3.1)

## 2023-09-26 LAB — RENAL FUNCTION PANEL
Albumin: 2.9 g/dL — ABNORMAL LOW (ref 3.5–5.0)
Anion gap: 9 (ref 5–15)
BUN: 41 mg/dL — ABNORMAL HIGH (ref 6–20)
CO2: 24 mmol/L (ref 22–32)
Calcium: 7.8 mg/dL — ABNORMAL LOW (ref 8.9–10.3)
Chloride: 94 mmol/L — ABNORMAL LOW (ref 98–111)
Creatinine, Ser: 2.78 mg/dL — ABNORMAL HIGH (ref 0.61–1.24)
GFR, Estimated: 29 mL/min — ABNORMAL LOW (ref >60)
Glucose, Bld: 84 mg/dL (ref 70–99)
Phosphorus: 4.3 mg/dL (ref 2.5–4.6)
Potassium: 5.2 mmol/L — ABNORMAL HIGH (ref 3.5–5.1)
Sodium: 127 mmol/L — ABNORMAL LOW (ref 135–145)

## 2023-09-26 LAB — RAPID URINE DRUG SCREEN, HOSP PERFORMED
Amphetamines: NOT DETECTED
Barbiturates: NOT DETECTED
Benzodiazepines: NOT DETECTED
Cocaine: NOT DETECTED
Opiates: NOT DETECTED
Tetrahydrocannabinol: POSITIVE — AB

## 2023-09-26 LAB — LACTATE DEHYDROGENASE: LDH: 161 U/L (ref 98–192)

## 2023-09-26 MED ORDER — SODIUM CHLORIDE 0.45 % IV SOLN: INTRAVENOUS | Status: DC

## 2023-09-26 MED ORDER — OXYCODONE HCL ER 15 MG PO T12A: 15.0000 mg | EXTENDED_RELEASE_TABLET | Freq: Two times a day (BID) | ORAL | 0 refills | Status: DC

## 2023-09-26 MED ORDER — POLYETHYLENE GLYCOL 3350 17 G PO PACK: 17.0000 g | PACK | Freq: Every day | ORAL | Status: DC | PRN

## 2023-09-26 MED ORDER — HYDROMORPHONE 1 MG/ML IV SOLN
INTRAVENOUS | Status: DC
  Administered 2023-09-26 (×2): 30 mg via INTRAVENOUS
  Filled 2023-09-26: qty 30

## 2023-09-26 MED ORDER — ONDANSETRON HCL 4 MG/2ML IJ SOLN
4.0000 mg | Freq: Four times a day (QID) | INTRAMUSCULAR | Status: DC | PRN
  Administered 2023-09-26 (×2): 4 mg via INTRAVENOUS
  Filled 2023-09-26: qty 2

## 2023-09-26 MED ORDER — SENNOSIDES-DOCUSATE SODIUM 8.6-50 MG PO TABS: 1.0000 | ORAL_TABLET | Freq: Two times a day (BID) | ORAL | Status: DC

## 2023-09-26 MED ORDER — SODIUM CHLORIDE 0.9% FLUSH: 9.0000 mL | INTRAVENOUS | Status: DC | PRN

## 2023-09-26 MED ORDER — ACETAMINOPHEN 500 MG PO TABS
1000.0000 mg | ORAL_TABLET | Freq: Once | ORAL | Status: AC
  Administered 2023-09-26 (×2): 1000 mg via ORAL
  Filled 2023-09-26: qty 2

## 2023-09-26 MED ORDER — NALOXONE HCL 0.4 MG/ML IJ SOLN: 0.4000 mg | INTRAMUSCULAR | Status: DC | PRN

## 2023-09-26 MED ORDER — OXYCODONE HCL 10 MG PO TABS: 10.0000 mg | ORAL_TABLET | ORAL | 0 refills | Status: DC

## 2023-09-26 MED ORDER — DIPHENHYDRAMINE HCL 25 MG PO CAPS
25.0000 mg | ORAL_CAPSULE | ORAL | Status: DC | PRN
  Administered 2023-09-26 (×2): 25 mg via ORAL
  Filled 2023-09-26: qty 1

## 2023-09-26 NOTE — Discharge Summary (Addendum)
  Physician Discharge Summary  [name] [name]. FMW:[id number] DOB: [birth year] DOA: 09/26/2023  PCP: [name] Bascom RAMAN, NP  Admit date: 09/26/2023  Discharge date: 09/26/2023  Time spent: 30 minutes  Discharge Diagnoses:  Principal Problem:   Sickle cell anemia with pain (HCC)   Discharge Condition: Stable  Diet recommendation: Regular  History of present illness:   [name] [name]. is a 43 y.o. male with history of sickle cell disease, Chronic pain syndrome, opiate dependence and tolerance, anemia of chronic disease, and CKD stage IV who presented to the day hospital with major complaints of generalized body pain but primarily in his lower extremities and back, rates pain at 10/10. He describes this pain as constant and achy and throbbing.  Patient is reporting one fall in the last week, he fell on his right knee and today he is walking with a limp using a cane for ambulation.  Patient is scheduled to see orthopedic in the next few days. Patient denies cough, shortness of breath, headache, dizziness, nausea, vomiting, diarrhea.  No urinary symptoms.  Hospital Course:  [name] [name]. was admitted to the day hospital with sickle cell painful crisis. Patient was treated with IV fluid, weight based IV Dilaudid  PCA, clinician assisted doses as deemed appropriate, and other adjunct therapies per sickle cell pain management protocol. [name] [name] showed significant improvement symptomatically, pain improved from 10/10 to 5/10 at the time of discharge. Patient was discharged home in a hemodynamically stable condition. [name] [name] will follow-up at the clinic as previously scheduled, continue with home medications as per prior to admission.  Discharge Instructions We discussed the need for good hydration, monitoring of hydration status, avoidance of heat, cold, stress, and infection triggers. We discussed the need to be compliant with taking Hydrea  and other home medications. [name] [name] was  reminded of the need to seek medical attention immediately if any symptom of bleeding, anemia, or infection occurs.  Discharge Exam: Vitals:   09/26/23 1554 09/26/23 1615  BP: (!) 131/93   Pulse: 83   Resp: 15 15  Temp:    SpO2: 97%     General appearance: alert, cooperative and no distress Eyes: conjunctivae/corneas clear. PERRL, EOM's intact. Fundi benign. Neck: no adenopathy, no carotid bruit, no JVD, supple, symmetrical, trachea midline and thyroid  not enlarged, symmetric, no tenderness/mass/nodules Back: symmetric, no curvature. ROM normal. No CVA tenderness. Resp: clear to auscultation bilaterally Chest wall: no tenderness Cardio: regular rate and rhythm, S1, S2 normal, no murmur, click, rub or gallop GI: soft, non-tender; bowel sounds normal; no masses,  no organomegaly Extremities: extremities normal, atraumatic, no cyanosis or edema Pulses: 2+ and symmetric Skin: Skin color, texture, turgor normal. No rashes or lesions Neurologic: Grossly normal  Discharge Instructions     Call MD for:  severe uncontrolled pain   Complete by: As directed    Call MD for:  temperature >100.4   Complete by: As directed    Diet - low sodium heart healthy   Complete by: As directed    Increase activity slowly   Complete by: As directed         Allergies  Allergen Reactions   Nsaids Other (See Comments)    Kidney disease   Morphine  And Codeine  Other (See Comments)    Really bad headaches      Significant Diagnostic Studies: No results found.  Signed:  Homer CHRISTELLA Cover NP   09/26/2023, 4:33 PM

## 2023-09-26 NOTE — H&P (Addendum)
 [facility] History and Physical  [name] [name]. FMW:[id number] DOB: 04-23-1980 DOA: 09/26/2023  PCP: [name], NP   Chief Complaint:  Chief Complaint  Patient presents with   Sickle Cell Pain Crisis    HPI: [name] [name]. is a 43 y.o. male with history of sickle cell disease, Chronic pain syndrome, opiate dependence and tolerance, anemia of chronic disease, and CKD stage IV who presented to the day hospital with major complaints of generalized body pain but primarily in his lower extremities and back, rates pain at 9/10. He describes this pain as constant and achy and throbbing.  Patient is reporting one fall in the last week, he fell on his right knee and today he is walking with a limp using a cane for ambulation.  Patient is scheduled to see orthopedic in the next few days. Patient denies cough, shortness of breath, headache, dizziness, nausea, vomiting, diarrhea.  No urinary symptoms.  Systemic Review: General: The patient denies anorexia, fever, weight loss Cardiac: Denies chest pain, syncope, palpitations, pedal edema  Respiratory: Denies cough, shortness of breath, wheezing GI: Denies severe indigestion/heartburn, abdominal pain, nausea, vomiting, diarrhea and constipation GU: Denies hematuria, incontinence, dysuria  Musculoskeletal: Bilateral lower extremity pain worse in the right knee.   Skin: Denies suspicious skin lesions Neurologic: Denies focal weakness or numbness, change in vision  Past Medical History:  Diagnosis Date   Abscess of right iliac fossa 09/24/2018   required Perc Drain 09/24/2018   Arachnoid Cyst of brain bilaterally    2 really small ones in the back of my head; inside; saw them w/MRI (09/25/2012)   Bacterial pneumonia ~ 2012   caught it here in the hospital (09/25/2012)   Chronic kidney disease    from my sickle cell (09/25/2012)   CKD (chronic kidney disease), stage II    Colitis 04/19/2017   CT scan abd/  pelvis   GERD (gastroesophageal reflux disease)    after I eat alot of spicey foods (09/25/2012)   Gynecomastia, male 07/10/2012   History of blood transfusion    always related to sickle cell crisis (09/25/2012)   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   Noted in noted from Hematology notes at [facility]   Migraines    take RX qd to prevent them (09/25/2012)   Opioid dependence with withdrawal (HCC) 08/30/2012   Renal insufficiency    Sickle cell anemia (HCC)    Sickle cell crisis (HCC) 09/25/2012   Sickle cell nephropathy (HCC) 07/10/2012   Sinus tachycardia    Tachycardia with heart rate 121-140 beats per minute with ambulation 08/04/2016    Past Surgical History:  Procedure Laterality Date   ABCESS DRAINAGE     CHOLECYSTECTOMY  ~ 2012   COLONOSCOPY N/A 04/23/2017   Procedure: COLONOSCOPY;  Surgeon: [name], MD;  Location: [facility];  Service: Endoscopy;  Laterality: N/A;   IR FLUORO GUIDE CV LINE RIGHT  12/17/2016   IR FLUORO GUIDE CV LINE RIGHT  02/07/2021   IR RADIOLOGIST EVAL & MGMT  10/02/2018   IR RADIOLOGIST EVAL & MGMT  10/15/2018   IR REMOVAL TUN CV CATH W/O FL  12/21/2016   IR REMOVAL TUN CV CATH W/O FL  02/11/2021   IR US  GUIDE VASC ACCESS RIGHT  12/17/2016   IR US  GUIDE VASC ACCESS RIGHT  02/07/2021   spleenectomy      Allergies  Allergen Reactions   Nsaids Other (See Comments)    Kidney disease  Morphine  And Codeine  Other (See Comments)    Really bad headaches     Family History  Problem Relation Age of Onset   Breast cancer Mother       Prior to Admission medications   Medication Sig Start Date End Date Taking? Authorizing Provider  amitriptyline  (ELAVIL ) 25 MG tablet TAKE 2 TABLETS(50 MG) BY MOUTH AT BEDTIME 08/29/23   Nichols, [name], NP  calcitRIOL  (ROCALTROL ) 0.25 MCG capsule Take 0.25 mcg by mouth daily with breakfast. 11/16/20   [provider]  Deferiprone , Twice Daily, (FERRIPROX  TWICE-A-DAY) 1000 MG TABS Take 4,000  mg by mouth daily. 05/01/22   Tilford Bertram HERO, FNP  folic acid  (FOLVITE ) 1 MG tablet TAKE 1 TABLET BY MOUTH EVERY DAY 03/22/20   Hollis, Lachina M, FNP  hydroxyurea  (HYDREA ) 500 MG capsule Take 1 capsule by mouth in the morning on Sun/Tues/Wed/Thurs/Sat and 2 capsules on Mon/Fri 01/25/23   Nichols, [name], NP  oxyCODONE  (OXYCONTIN ) 15 mg 12 hr tablet Take 1 tablet (15 mg) by mouth every 12 hours. 08/16/23   [name], [name], MD  Oxycodone  HCl 10 MG TABS Take 1 tablet (10 mg total) by mouth See admin instructions. Take 10 mg by mouth every 6 hours 09/05/23   [name], [name], MD  sodium bicarbonate  650 MG tablet Take 1,950 mg by mouth 3 (three) times daily. 11/07/18   [provider]  sodium zirconium cyclosilicate  (LOKELMA ) 10 g PACK packet Take 10 g by mouth daily. 04/21/23 04/22/24  [provider]  TYLENOL  8 HOUR ARTHRITIS PAIN 650 MG CR tablet Take 1,300 mg by mouth as needed for pain.    [provider]     Physical Exam: Vitals:   09/26/23 1204 09/26/23 1213  BP: 128/84   Pulse: 88   Resp: 20 12  Temp: (!) 97.4 F (36.3 C)   TempSrc: Temporal   SpO2: 97%     General: Alert, awake, afebrile, anicteric, not in obvious distress HEENT: Normocephalic and Atraumatic, Mucous membranes pink                PERRLA; EOM intact; No scleral icterus,                 Nares: Patent, Oropharynx: Clear, Fair Dentition                 Neck: FROM, no cervical lymphadenopathy, thyromegaly, carotid bruit or JVD;  CHEST WALL: No tenderness  CHEST: Normal respiration, clear to auscultation bilaterally  HEART: Regular rate and rhythm; no murmurs rubs or gallops  BACK: No kyphosis or scoliosis; no CVA tenderness  ABDOMEN: Positive Bowel Sounds, soft, non-tender; no masses, no organomegaly EXTREMITIES: No cyanosis, clubbing, or edema SKIN:  no rash or ulceration  CNS: Alert and Oriented x 4, Nonfocal exam, CN 2-12 intact  Labs on Admission:  Basic Metabolic Panel: Recent  Labs  Lab 09/26/23 1035  NA 128*  K 4.3  CL 93*  CO2 25  GLUCOSE 79  BUN 40*  CREATININE 2.94*  CALCIUM  7.9*   Liver Function Tests: Recent Labs  Lab 09/26/23 1035  AST 71*  ALT 45*  ALKPHOS 178*  BILITOT 1.3*  PROT 6.9  ALBUMIN  3.0*   No results for input(s): LIPASE, AMYLASE in the last 168 hours. No results for input(s): AMMONIA in the last 168 hours. CBC: Recent Labs  Lab 09/26/23 1035  WBC 7.6  NEUTROABS 3.6  HGB 8.0*  HCT 23.0*  MCV 124.3*  PLT 161  Cardiac Enzymes: No results for input(s): CKTOTAL, CKMB, CKMBINDEX, TROPONINI in the last 168 hours.  BNP (last 3 results) No results for input(s): BNP in the last 8760 hours.  ProBNP (last 3 results) No results for input(s): PROBNP in the last 8760 hours.  CBG: No results for input(s): GLUCAP in the last 168 hours.   Assessment/Plan Principal Problem:   Sickle cell anemia with pain (HCC)  Admits to the Day Hospital for extended observation IVF 0 .45% Saline @ 125 mls/hour Weight based Dilaudid  PCA started within 30 minutes of admission Acetaminophen  1000 mg x 1 dose Labs: CBCD, CMP, Retic Count and LDH Monitor vitals very closely, Re-evaluate pain scale every hour 2 L of Oxygen  by [place] Patient will be re-evaluated for pain in the context of function and relationship to baseline as care progresses. If no significant relieve from pain (remains above 5/10) will transfer patient to inpatient services for further evaluation and management  Code Status: Full  Family Communication: None  DVT Prophylaxis: Ambulate as tolerated   Time spent: 35 Minutes  Homer CHRISTELLA Cover NP  If 7PM-7AM, please contact night-coverage [email or web address] 09/26/2023, 1:18 PM

## 2023-09-26 NOTE — Progress Notes (Signed)
 Patient admitted to the day hospital for sickle cell pain crisis. Initially, patient reported bilateral leg pain rated 10/10. For pain management, patient placed on Sickle Cell Dose Dilaudid  PCA, given 1000 mg PO Tylenol  and hydrated with IV fluids. At discharge, patient rated pain at 5/10. Vital signs stable. AVS offered but patient declined. Patient alert, oriented and ambulatory at discharge.

## 2023-09-26 NOTE — Telephone Encounter (Signed)
 Patient called requesting to come to the day hospital for sickle cell pain. Patient reports bilateral leg pain rated 10/10. Reports taking Oxycodone  10 mg and Oxycontin  15 mg yesterday. Patient is now out of his pain medication and has requested a refill. Denies fever, chest pain, nausea, vomiting, diarrhea, abdominal pain and priapism. Admits to having transportation without driving self. [name], NP notified and advised that patient come to the day hospital for pain management. Patient advised and expresses an understanding.

## 2023-09-27 LAB — PARATHYROID HORMONE, INTACT (NO CA): PTH: 109 pg/mL — ABNORMAL HIGH (ref 15–65)

## 2023-09-28 ENCOUNTER — Other Ambulatory Visit: Payer: Self-pay

## 2023-10-02 ENCOUNTER — Encounter (HOSPITAL_COMMUNITY): Payer: Self-pay

## 2023-10-02 ENCOUNTER — Observation Stay (HOSPITAL_COMMUNITY)
Admission: EM | Admit: 2023-10-02 | Discharge: 2023-10-03 | Disposition: A | Discharge status: Home / Self Care | Attending: Internal Medicine | Admitting: Internal Medicine

## 2023-10-02 ENCOUNTER — Other Ambulatory Visit: Payer: Self-pay

## 2023-10-02 ENCOUNTER — Emergency Department (HOSPITAL_COMMUNITY)

## 2023-10-02 DIAGNOSIS — D571 Sickle-cell disease without crisis: Secondary | ICD-10-CM | POA: Diagnosis present

## 2023-10-02 DIAGNOSIS — F17220 Nicotine dependence, chewing tobacco, uncomplicated: Secondary | ICD-10-CM | POA: Diagnosis not present

## 2023-10-02 DIAGNOSIS — G894 Chronic pain syndrome: Secondary | ICD-10-CM | POA: Diagnosis not present

## 2023-10-02 DIAGNOSIS — D5700 Hb-SS disease with crisis, unspecified: Principal | ICD-10-CM | POA: Diagnosis present

## 2023-10-02 DIAGNOSIS — N184 Chronic kidney disease, stage 4 (severe): Secondary | ICD-10-CM | POA: Diagnosis present

## 2023-10-02 DIAGNOSIS — F17200 Nicotine dependence, unspecified, uncomplicated: Secondary | ICD-10-CM | POA: Diagnosis present

## 2023-10-02 DIAGNOSIS — D649 Anemia, unspecified: Secondary | ICD-10-CM | POA: Diagnosis not present

## 2023-10-02 DIAGNOSIS — D638 Anemia in other chronic diseases classified elsewhere: Secondary | ICD-10-CM | POA: Diagnosis present

## 2023-10-02 DIAGNOSIS — D72829 Elevated white blood cell count, unspecified: Secondary | ICD-10-CM | POA: Diagnosis not present

## 2023-10-02 DIAGNOSIS — I129 Hypertensive chronic kidney disease with stage 1 through stage 4 chronic kidney disease, or unspecified chronic kidney disease: Secondary | ICD-10-CM | POA: Diagnosis not present

## 2023-10-02 DIAGNOSIS — G4700 Insomnia, unspecified: Secondary | ICD-10-CM

## 2023-10-02 LAB — CBC WITH DIFFERENTIAL/PLATELET
Abs Immature Granulocytes: 0.03 K/uL (ref 0.00–0.07)
Basophils Absolute: 0 K/uL (ref 0.0–0.1)
Basophils Relative: 0 %
Eosinophils Absolute: 0.1 K/uL (ref 0.0–0.5)
Eosinophils Relative: 1 %
HCT: 23.1 % — ABNORMAL LOW (ref 39.0–52.0)
Hemoglobin: 7.7 g/dL — ABNORMAL LOW (ref 13.0–17.0)
Immature Granulocytes: 0 %
Lymphocytes Relative: 48 %
Lymphs Abs: 3.3 K/uL (ref 0.7–4.0)
MCH: 42.1 pg — ABNORMAL HIGH (ref 26.0–34.0)
MCHC: 33.3 g/dL (ref 30.0–36.0)
MCV: 126.2 fL — ABNORMAL HIGH (ref 80.0–100.0)
Monocytes Absolute: 0.3 K/uL (ref 0.1–1.0)
Monocytes Relative: 5 %
Neutro Abs: 3.2 K/uL (ref 1.7–7.7)
Neutrophils Relative %: 46 %
Platelets: 142 K/uL — ABNORMAL LOW (ref 150–400)
RBC: 1.83 MIL/uL — ABNORMAL LOW (ref 4.22–5.81)
RDW: 25.9 % — ABNORMAL HIGH (ref 11.5–15.5)
Smear Review: NORMAL
WBC: 7 K/uL (ref 4.0–10.5)
nRBC: 0.6 % — ABNORMAL HIGH (ref 0.0–0.2)

## 2023-10-02 LAB — TYPE AND SCREEN
ABO/RH(D): O POS
Antibody Screen: NEGATIVE

## 2023-10-02 LAB — COMPREHENSIVE METABOLIC PANEL WITH GFR
ALT: 43 U/L (ref 0–44)
AST: 54 U/L — ABNORMAL HIGH (ref 15–41)
Albumin: 3.4 g/dL — ABNORMAL LOW (ref 3.5–5.0)
Alkaline Phosphatase: 204 U/L — ABNORMAL HIGH (ref 38–126)
Anion gap: 10 (ref 5–15)
BUN: 45 mg/dL — ABNORMAL HIGH (ref 6–20)
CO2: 23 mmol/L (ref 22–32)
Calcium: 8.8 mg/dL — ABNORMAL LOW (ref 8.9–10.3)
Chloride: 104 mmol/L (ref 98–111)
Creatinine, Ser: 3.45 mg/dL — ABNORMAL HIGH (ref 0.61–1.24)
GFR, Estimated: 22 mL/min — ABNORMAL LOW (ref >60)
Glucose, Bld: 94 mg/dL (ref 70–99)
Potassium: 4.2 mmol/L (ref 3.5–5.1)
Sodium: 137 mmol/L (ref 135–145)
Total Bilirubin: 1.5 mg/dL — ABNORMAL HIGH (ref 0.0–1.2)
Total Protein: 7.7 g/dL (ref 6.5–8.1)

## 2023-10-02 LAB — RETICULOCYTES
Immature Retic Fract: 22.1 % — ABNORMAL HIGH (ref 2.3–15.9)
RBC.: 1.8 MIL/uL — ABNORMAL LOW (ref 4.22–5.81)
Retic Count, Absolute: 83.2 K/uL (ref 19.0–186.0)
Retic Ct Pct: 4.6 % — ABNORMAL HIGH (ref 0.4–3.1)

## 2023-10-02 MED ORDER — DEFERIPRONE (TWICE DAILY) 1000 MG PO TABS: 2000.0000 mg | ORAL_TABLET | Freq: Two times a day (BID) | ORAL | Status: DC

## 2023-10-02 MED ORDER — AMITRIPTYLINE HCL 25 MG PO TABS: 25.0000 mg | ORAL_TABLET | Freq: Every evening | ORAL | Status: DC | PRN

## 2023-10-02 MED ORDER — HYDROXYUREA 500 MG PO CAPS
500.0000 mg | ORAL_CAPSULE | ORAL | Status: DC
  Administered 2023-10-03: 500 mg via ORAL
  Filled 2023-10-02 (×2): qty 1

## 2023-10-02 MED ORDER — HYDROMORPHONE HCL 1 MG/ML IJ SOLN
2.0000 mg | INTRAMUSCULAR | Status: AC
  Administered 2023-10-02: 2 mg via INTRAVENOUS
  Filled 2023-10-02: qty 2

## 2023-10-02 MED ORDER — HYDROMORPHONE HCL 1 MG/ML IJ SOLN
2.0000 mg | Freq: Once | INTRAMUSCULAR | Status: AC
  Administered 2023-10-02: 2 mg via INTRAVENOUS
  Filled 2023-10-02: qty 2

## 2023-10-02 MED ORDER — HYDROXYUREA 500 MG PO CAPS: 1000.0000 mg | ORAL_CAPSULE | ORAL | Status: DC

## 2023-10-02 MED ORDER — SODIUM ZIRCONIUM CYCLOSILICATE 10 G PO PACK
10.0000 g | PACK | Freq: Every day | ORAL | Status: DC
Start: 2023-10-02 — End: 2023-10-03

## 2023-10-02 MED ORDER — SODIUM CHLORIDE 0.9 % IV BOLUS
1000.0000 mL | Freq: Once | INTRAVENOUS | Status: AC
  Administered 2023-10-02: 1000 mL via INTRAVENOUS

## 2023-10-02 MED ORDER — DIPHENHYDRAMINE HCL 50 MG/ML IJ SOLN
25.0000 mg | Freq: Once | INTRAMUSCULAR | Status: AC
  Administered 2023-10-02: 25 mg via INTRAVENOUS
  Filled 2023-10-02: qty 1

## 2023-10-02 MED ORDER — NALOXONE HCL 0.4 MG/ML IJ SOLN: 0.4000 mg | INTRAMUSCULAR | Status: DC | PRN

## 2023-10-02 MED ORDER — DIPHENHYDRAMINE HCL 25 MG PO CAPS: 25.0000 mg | ORAL_CAPSULE | ORAL | Status: DC | PRN

## 2023-10-02 MED ORDER — SODIUM CHLORIDE 0.9% FLUSH: 9.0000 mL | INTRAVENOUS | Status: DC | PRN

## 2023-10-02 MED ORDER — HYDROMORPHONE 1 MG/ML IV SOLN
INTRAVENOUS | Status: DC
  Administered 2023-10-02: 3 mg via INTRAVENOUS
  Administered 2023-10-02: 30 mg via INTRAVENOUS
  Administered 2023-10-02: 3 mg via INTRAVENOUS
  Administered 2023-10-03: 2.5 mg via INTRAVENOUS
  Administered 2023-10-03: 3 mg via INTRAVENOUS
  Administered 2023-10-03: 1.5 mg via INTRAVENOUS
  Filled 2023-10-02: qty 30

## 2023-10-02 MED ORDER — DARBEPOETIN ALFA 150 MCG/0.3ML IJ SOSY
150.0000 ug | PREFILLED_SYRINGE | Freq: Once | INTRAMUSCULAR | Status: AC
  Administered 2023-10-02: 150 ug via SUBCUTANEOUS
  Filled 2023-10-02: qty 0.3

## 2023-10-02 MED ORDER — SODIUM CHLORIDE 0.45 % IV SOLN: INTRAVENOUS | Status: DC

## 2023-10-02 MED ORDER — ONDANSETRON HCL 4 MG/2ML IJ SOLN
4.0000 mg | INTRAMUSCULAR | Status: DC | PRN
  Filled 2023-10-02: qty 2

## 2023-10-02 MED ORDER — ONDANSETRON HCL 4 MG/2ML IJ SOLN: 4.0000 mg | Freq: Four times a day (QID) | INTRAMUSCULAR | Status: DC | PRN

## 2023-10-02 MED ORDER — FOLIC ACID 1 MG PO TABS
1.0000 mg | ORAL_TABLET | Freq: Every day | ORAL | Status: DC
  Administered 2023-10-03: 1 mg via ORAL
  Filled 2023-10-02 (×2): qty 1

## 2023-10-02 MED ORDER — HYDROXYUREA 500 MG PO CAPS: 500.0000 mg | ORAL_CAPSULE | ORAL | Status: DC

## 2023-10-02 MED ORDER — OXYCODONE HCL 5 MG PO TABS
10.0000 mg | ORAL_TABLET | Freq: Four times a day (QID) | ORAL | Status: DC | PRN
  Administered 2023-10-02: 10 mg via ORAL
  Filled 2023-10-02: qty 2

## 2023-10-02 MED ORDER — OXYCODONE HCL ER 15 MG PO T12A
15.0000 mg | EXTENDED_RELEASE_TABLET | Freq: Two times a day (BID) | ORAL | Status: DC
  Administered 2023-10-02 – 2023-10-03 (×2): 15 mg via ORAL
  Filled 2023-10-02 (×2): qty 1

## 2023-10-02 NOTE — ED Triage Notes (Signed)
 Pt reports with [facility] since yesterday. Pt has vomiting, weakness, and bilateral leg pain with right knee swelling. Pt states that he did not take any medications for pain today.

## 2023-10-02 NOTE — H&P (Signed)
 H&P  Patient Demographics:  [name] [name], is a 43 y.o. male  MRN: [id number]   DOB - May 11, 1980  Admit Date - 10/02/2023  Outpatient Primary MD for the patient is [name], NP  Chief Complaint  Patient presents with   Sickle Cell Pain Crisis      HPI:   [name] [name]  is a 43 y.o. male , with history of sickle cell disease, Chronic pain syndrome, opiate dependence and tolerance, anemia of chronic disease, and CKD stage IV who presented to the day emergency department with major complaints of generalized body pain but primarily in his lower extremities and back, rates pain at 10/10. He describes this pain as constant and achy and throbbing.  Patient is reporting one fall in the last week, he fell on his right knee and today he is walking with a limp using a cane for ambulation.  Patient is scheduled to see orthopedic in the next few days. Patient denies cough, shortness of breath, headache, dizziness, nausea, vomiting, diarrhea.  No urinary symptoms.   Review of systems:  In addition to the HPI above, patient reports No fever or chills No Headache, No changes with vision or hearing No problems swallowing food or liquids No chest pain, cough or shortness of breath No abdominal pain, No nausea or vomiting, Bowel movements are regular No blood in stool or urine No dysuria No new skin rashes or bruises No new weakness, tingling, numbness in any extremity No recent weight gain or loss No polyuria, polydypsia or polyphagia No significant Mental Stressors  A full 10 point Review of Systems was done, except as stated above, all other Review of Systems were negative.  With Past History of the following :   Past Medical History:  Diagnosis Date   Abscess of right iliac fossa 09/24/2018   required Perc Drain 09/24/2018   Arachnoid Cyst of brain bilaterally    2 really small ones in the back of my head; inside; saw them w/MRI (09/25/2012)   Bacterial pneumonia ~ 2012    caught it here in the hospital (09/25/2012)   Chronic kidney disease    from my sickle cell (09/25/2012)   CKD (chronic kidney disease), stage II    Colitis 04/19/2017   CT scan abd/ pelvis   GERD (gastroesophageal reflux disease)    after I eat alot of spicey foods (09/25/2012)   Gynecomastia, male 07/10/2012   History of blood transfusion    always related to sickle cell crisis (09/25/2012)   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   Noted in noted from Hematology notes at [facility]   Migraines    take RX qd to prevent them (09/25/2012)   Opioid dependence with withdrawal (HCC) 08/30/2012   Renal insufficiency    Sickle cell anemia (HCC)    Sickle cell crisis (HCC) 09/25/2012   Sickle cell nephropathy (HCC) 07/10/2012   Sinus tachycardia    Tachycardia with heart rate 121-140 beats per minute with ambulation 08/04/2016      Past Surgical History:  Procedure Laterality Date   ABCESS DRAINAGE     CHOLECYSTECTOMY  ~ 2012   COLONOSCOPY N/A 04/23/2017   Procedure: COLONOSCOPY;  Surgeon: [name], MD;  Location: [facility];  Service: Endoscopy;  Laterality: N/A;   IR FLUORO GUIDE CV LINE RIGHT  12/17/2016   IR FLUORO GUIDE CV LINE RIGHT  02/07/2021   IR RADIOLOGIST EVAL & MGMT  10/02/2018   IR RADIOLOGIST EVAL & MGMT  10/15/2018  IR REMOVAL TUN CV CATH W/O FL  12/21/2016   IR REMOVAL TUN CV CATH W/O FL  02/11/2021   IR US  GUIDE VASC ACCESS RIGHT  12/17/2016   IR US  GUIDE VASC ACCESS RIGHT  02/07/2021   spleenectomy       Social History:   Social History   Tobacco Use   Smoking status: Former    Current packs/day: 0.10    Types: Cigarettes   Smokeless tobacco: Never  Substance Use Topics   Alcohol  use: Yes    Comment: Once a month     Lives - At home   Family History :   Family History  Problem Relation Age of Onset   Breast cancer Mother      Home Medications:   Prior to Admission medications   Medication Sig Start Date End Date Taking?  Authorizing Provider  amitriptyline  (ELAVIL ) 25 MG tablet TAKE 2 TABLETS(50 MG) BY MOUTH AT BEDTIME 08/29/23   Nichols, [name], NP  calcitRIOL  (ROCALTROL ) 0.25 MCG capsule Take 0.25 mcg by mouth daily with breakfast. 11/16/20   [provider]  Deferiprone , Twice Daily, (FERRIPROX  TWICE-A-DAY) 1000 MG TABS Take 4,000 mg by mouth daily. 05/01/22   Tilford Bertram HERO, FNP  folic acid  (FOLVITE ) 1 MG tablet TAKE 1 TABLET BY MOUTH EVERY DAY 03/22/20   Hollis, Lachina M, FNP  hydroxyurea  (HYDREA ) 500 MG capsule Take 1 capsule by mouth in the morning on Sun/Tues/Wed/Thurs/Sat and 2 capsules on Mon/Fri 01/25/23   Nichols, [name], NP  oxyCODONE  (OXYCONTIN ) 15 mg 12 hr tablet Take 1 tablet (15 mg) by mouth every 12 hours. 09/26/23   Cherylene Homer HERO, NP  Oxycodone  HCl 10 MG TABS Take 1 tablet (10 mg total) by mouth See admin instructions. Take 10 mg by mouth every 6 hours 09/26/23   Kanesha Cadle M, NP  sodium bicarbonate  650 MG tablet Take 1,950 mg by mouth 3 (three) times daily. 11/07/18   [provider]  sodium zirconium cyclosilicate  (LOKELMA ) 10 g PACK packet Take 10 g by mouth daily. 04/21/23 04/22/24  [provider]  TYLENOL  8 HOUR ARTHRITIS PAIN 650 MG CR tablet Take 1,300 mg by mouth as needed for pain.    [provider]     Allergies:   Allergies  Allergen Reactions   Nsaids Other (See Comments)    Kidney disease   Morphine  Other (See Comments)    -Really bad headaches     Physical Exam:   Vitals:   Vitals:   10/02/23 1503 10/02/23 1507  BP: 128/88   Pulse: 88   Resp: 18   Temp: 98 F (36.7 C) 98 F (36.7 C)  SpO2: 99%     Physical Exam: Constitutional: Patient appears well-developed and well-nourished. Not in obvious distress. HENT: Normocephalic, atraumatic, External right and left ear normal. Oropharynx is clear and moist.  Eyes: Conjunctivae and EOM are normal. PERRLA, no scleral icterus. Neck: Normal ROM. Neck supple. No JVD. No tracheal  deviation. No thyromegaly. CVS: RRR, S1/S2 +, no murmurs, no gallops, no carotid bruit.  Pulmonary: Effort and breath sounds normal, no stridor, rhonchi, wheezes, rales.  Abdominal: Soft. BS +, no distension, tenderness, rebound or guarding.  Musculoskeletal: Normal range of motion. No edema and no tenderness.  Lymphadenopathy: No lymphadenopathy noted, cervical, inguinal or axillary Neuro: Alert. Normal reflexes, muscle tone coordination. No cranial nerve deficit. Skin: Skin is warm and dry. No rash noted. Not diaphoretic. No erythema. No pallor. Psychiatric: Normal mood and  affect. Behavior, judgment, thought content normal.   Data Review:   CBC Recent Labs  Lab 09/26/23 1035 10/02/23 0906  WBC 7.6 7.0  HGB 8.0* 7.7*  HCT 23.0* 23.1*  PLT 161 142*  MCV 124.3* 126.2*  MCH 43.2* 42.1*  MCHC 34.8 33.3  RDW 27.2* 25.9*  LYMPHSABS 3.6 3.3  MONOABS 0.3 0.3  EOSABS 0.0 0.1  BASOSABS 0.0 0.0   ------------------------------------------------------------------------------------------------------------------  Chemistries  Recent Labs  Lab 09/26/23 1035 09/26/23 1500 10/02/23 0906  NA 128* 127* 137  K 4.3 5.2* 4.2  CL 93* 94* 104  CO2 25 24 23   GLUCOSE 79 84 94  BUN 40* 41* 45*  CREATININE 2.94* 2.78* 3.45*  CALCIUM  7.9* 7.8* 8.8*  AST 71*  --  54*  ALT 45*  --  43  ALKPHOS 178*  --  204*  BILITOT 1.3*  --  1.5*   ------------------------------------------------------------------------------------------------------------------ CrCl cannot be calculated (Unknown ideal weight.). ------------------------------------------------------------------------------------------------------------------ No results for input(s): TSH, T4TOTAL, T3FREE, THYROIDAB in the last 72 hours.  Invalid input(s): FREET3  Coagulation profile No results for input(s): INR, PROTIME in the last 168  hours. ------------------------------------------------------------------------------------------------------------------- No results for input(s): DDIMER in the last 72 hours. -------------------------------------------------------------------------------------------------------------------  Cardiac Enzymes No results for input(s): CKMB, TROPONINI, MYOGLOBIN in the last 168 hours.  Invalid input(s): CK ------------------------------------------------------------------------------------------------------------------ No results found for: BNP  ---------------------------------------------------------------------------------------------------------------  Urinalysis    Component Value Date/Time   COLORURINE YELLOW 04/03/2022 1011   APPEARANCEUR CLEAR 04/03/2022 1011   LABSPEC 1.008 04/03/2022 1011   PHURINE 8.0 04/03/2022 1011   GLUCOSEU 50 (A) 04/03/2022 1011   HGBUR NEGATIVE 04/03/2022 1011   HGBUR small 03/10/2010 1445   BILIRUBINUR NEGATIVE 04/03/2022 1011   BILIRUBINUR negative 04/19/2021 1217   BILIRUBINUR neg 09/30/2020 1117   KETONESUR NEGATIVE 04/03/2022 1011   PROTEINUR NEGATIVE 04/03/2022 1011   UROBILINOGEN 0.2 04/19/2021 1217   UROBILINOGEN 0.2 05/11/2017 1335   NITRITE NEGATIVE 04/03/2022 1011   LEUKOCYTESUR NEGATIVE 04/03/2022 1011    ----------------------------------------------------------------------------------------------------------------   Imaging Results:    DG Knee Complete 4 Views Right Result Date: 10/02/2023 CLINICAL DATA:  Right knee pain EXAM: RIGHT KNEE - COMPLETE 4+ VIEW COMPARISON:  August 06, 2023 FINDINGS: Status post right knee arthroplasty with proper position of the prosthetic device. Normal knee alignment . No suspicious lucency around the hardware. No evidence of fracture, dislocation. Query trace suprapatellar joint effusion. The no evidence of arthropathy or other focal bone abnormality. Soft tissues are unremarkable.  IMPRESSION: Total knee arthroplasty.  No acute fracture or hardware abnormality. Electronically Signed   By: Megan  Zare M.D.   On: 10/02/2023 11:35     Assessment & Plan:  Principal Problem:   Sickle cell anemia with pain (HCC) Active Problems:   TOBACCO ABUSE   CKD (chronic kidney disease), stage IV (HCC)   Leukocytosis   Anemia of chronic disease   Hb Sickle Cell Disease with crisis: Admit patient, start IVF 0.45% Saline @ 125 mls/hour, start weight based Dilaudid  PCA, Restart oral home pain medications, Monitor vitals very closely, Re-evaluate pain scale regularly, 2 L of Oxygen  by [place], Patient will be re-evaluated for pain in the context of function and relationship to baseline as care progresses. Leukocytosis: Stable Anemia of Chronic Disease: Hemoglobin within patients baseline.  Chronic pain Syndrome: continue oral home medication Tobacco Abuse: Smoking cessation providing   DVT Prophylaxis: Subcut Lovenox    AM Labs Ordered, also please review Full Orders  Family Communication: Admission, patient's condition and plan of  care including tests being ordered have been discussed with the patient who indicate understanding and agree with the plan and Code Status.  Code Status: Full Code  Consults called: None    Admission status: Inpatient    Time spent in minutes : 50 minutes  Homer CHRISTELLA Cover NP  10/02/2023 at 4:04 PM

## 2023-10-02 NOTE — ED Provider Notes (Signed)
 [place] EMERGENCY DEPARTMENT AT [facility] Provider Note   CSN: [id number] Arrival date & time: 10/02/23  9240     Patient presents with: Sickle Cell Pain Crisis   [name] A Zelig Gacek. is a 43 y.o. male history of sickle cell trait, chronic pain here presenting with vomiting and right knee pain.  Patient states that he was recently admitted to the sickle cell clinic for pain crisis.  He states that since yesterday he has been vomiting.  He states that he was on the way here and fell and landed on his right knee and has right knee pain.  Patient denies any chest pain or shortness of breath.  Patient states that he has chronic kidney disease.   The history is provided by the patient.       Prior to Admission medications   Medication Sig Start Date End Date Taking? Authorizing Provider  amitriptyline  (ELAVIL ) 25 MG tablet TAKE 2 TABLETS(50 MG) BY MOUTH AT BEDTIME 08/29/23   Nichols, [name], NP  calcitRIOL  (ROCALTROL ) 0.25 MCG capsule Take 0.25 mcg by mouth daily with breakfast. 11/16/20   [provider]  Deferiprone , Twice Daily, (FERRIPROX  TWICE-A-DAY) 1000 MG TABS Take 4,000 mg by mouth daily. 05/01/22   Tilford Bertram HERO, FNP  folic acid  (FOLVITE ) 1 MG tablet TAKE 1 TABLET BY MOUTH EVERY DAY 03/22/20   Hollis, Lachina M, FNP  hydroxyurea  (HYDREA ) 500 MG capsule Take 1 capsule by mouth in the morning on Sun/Tues/Wed/Thurs/Sat and 2 capsules on Mon/Fri 01/25/23   Nichols, [name], NP  oxyCODONE  (OXYCONTIN ) 15 mg 12 hr tablet Take 1 tablet (15 mg) by mouth every 12 hours. 09/26/23   Ijaola, [name] M, NP  Oxycodone  HCl 10 MG TABS Take 1 tablet (10 mg total) by mouth See admin instructions. Take 10 mg by mouth every 6 hours 09/26/23   Ijaola, [name] M, NP  sodium bicarbonate  650 MG tablet Take 1,950 mg by mouth 3 (three) times daily. 11/07/18   [provider]  sodium zirconium cyclosilicate  (LOKELMA ) 10 g PACK packet Take 10 g by mouth daily. 04/21/23 04/22/24  [provider]  TYLENOL  8 HOUR ARTHRITIS PAIN 650 MG CR tablet Take 1,300 mg by mouth as needed for pain.    [provider]    Allergies: Nsaids and Morphine  and codeine     Review of Systems  Musculoskeletal:        R knee pain   All other systems reviewed and are negative.   Updated Vital Signs BP 109/78 (BP Location: Right Arm)   Pulse (!) 119   Temp 98.9 F (37.2 C) (Oral)   Resp 18   SpO2 98%   Physical Exam Vitals and nursing note reviewed.  Constitutional:      Comments: Slightly uncomfortable  HENT:     Head: Normocephalic.     Nose: Nose normal.     Mouth/Throat:     Mouth: Mucous membranes are moist.  Eyes:     Extraocular Movements: Extraocular movements intact.     Pupils: Pupils are equal, round, and reactive to light.  Cardiovascular:     Rate and Rhythm: Normal rate and regular rhythm.     Pulses: Normal pulses.     Heart sounds: Normal heart sounds.  Pulmonary:     Effort: Pulmonary effort is normal.     Breath sounds: Normal breath sounds.  Abdominal:     General: Abdomen is flat.     Palpations: Abdomen is  soft.  Musculoskeletal:     Cervical back: Normal range of motion and neck supple.     Comments: Right knee replacement and small contusion.  Able to range the knee.  No signs of septic knee.  No spinal tenderness  Skin:    General: Skin is warm.     Capillary Refill: Capillary refill takes less than 2 seconds.  Neurological:     General: No focal deficit present.     Mental Status: He is oriented to person, place, and time.  Psychiatric:        Mood and Affect: Mood normal.        Behavior: Behavior normal.     (all labs ordered are listed, but only abnormal results are displayed) Labs Reviewed  CBC WITH DIFFERENTIAL/PLATELET - Abnormal; Notable for the following components:      Result Value   RBC 1.83 (*)    Hemoglobin 7.7 (*)    HCT 23.1 (*)    MCV 126.2 (*)    MCH 42.1 (*)    RDW 25.9 (*)    Platelets 142 (*)     nRBC 0.6 (*)    All other components within normal limits  RETICULOCYTES - Abnormal; Notable for the following components:   Retic Ct Pct 4.6 (*)    RBC. 1.80 (*)    Immature Retic Fract 22.1 (*)    All other components within normal limits  COMPREHENSIVE METABOLIC PANEL WITH GFR  TYPE AND SCREEN    EKG: None  Radiology: No results found.   Procedures   CRITICAL CARE Performed by: Alm VEAR Cave   Total critical care time: 39 minutes  Critical care time was exclusive of separately billable procedures and treating other patients.  Critical care was necessary to treat or prevent imminent or life-threatening deterioration.  Critical care was time spent personally by me on the following activities: development of treatment plan with patient and/or surrogate as well as nursing, discussions with consultants, evaluation of patient's response to treatment, examination of patient, obtaining history from patient or surrogate, ordering and performing treatments and interventions, ordering and review of laboratory studies, ordering and review of radiographic studies, pulse oximetry and re-evaluation of patient's condition.   Medications Ordered in the ED  HYDROmorphone  (DILAUDID ) injection 2 mg (has no administration in time range)  HYDROmorphone  (DILAUDID ) injection 2 mg (has no administration in time range)  HYDROmorphone  (DILAUDID ) injection 2 mg (has no administration in time range)  ondansetron  (ZOFRAN ) injection 4 mg (has no administration in time range)  sodium chloride  0.9 % bolus 1,000 mL (has no administration in time range)                                    Medical Decision Making [name] A Jamis Kryder. is a 43 y.o. male here presenting with right knee pain and leg pain.  Patient has history of sickle cell and also has chronic pain.  Patient also has a history of CKD as well and has been vomiting.  Abdomen is nontender and I do not think he has acute abdominal process.  He  may have a gastroenteritis versus sickle cell pain crisis versus chronic pain.  Plan to get CBC and CMP and reticulocyte count.  Will hydrate patient and give pain medicine per sickle cell pain protocol.  11:36 AM Hemoglobin is 7.7.  Reticulocyte count is elevated.  Creatinine is 3.45 which is  slightly elevated.  Given IV fluids and 3 doses of Dilaudid  and still in 7 out of 10 pain.  Discussed with Ms. [name], NP from sickle cell clinic, who will come and admit patient     Amount and/or Complexity of Data Reviewed Labs: ordered. Radiology: ordered.  Risk Prescription drug management.     Final diagnoses:  None    ED Discharge Orders     None          [name] Alm Macho, MD 10/02/23 [phone]

## 2023-10-03 ENCOUNTER — Ambulatory Visit: Payer: Self-pay | Admitting: Nurse Practitioner

## 2023-10-03 ENCOUNTER — Non-Acute Institutional Stay (HOSPITAL_COMMUNITY)

## 2023-10-03 DIAGNOSIS — R7989 Other specified abnormal findings of blood chemistry: Secondary | ICD-10-CM

## 2023-10-03 DIAGNOSIS — D5700 Hb-SS disease with crisis, unspecified: Secondary | ICD-10-CM | POA: Diagnosis not present

## 2023-10-03 MED ORDER — SENNOSIDES-DOCUSATE SODIUM 8.6-50 MG PO TABS: 1.0000 | ORAL_TABLET | Freq: Every evening | ORAL | Status: DC | PRN

## 2023-10-03 MED ORDER — ORAL CARE MOUTH RINSE: 15.0000 mL | OROMUCOSAL | Status: DC | PRN

## 2023-10-03 MED ORDER — ENOXAPARIN SODIUM 30 MG/0.3ML IJ SOSY
30.0000 mg | PREFILLED_SYRINGE | Freq: Every day | INTRAMUSCULAR | Status: DC
  Administered 2023-10-03: 30 mg via SUBCUTANEOUS
  Filled 2023-10-03: qty 0.3

## 2023-10-03 NOTE — Care Management CC44 (Signed)
 Condition Code 44 Documentation Completed  Patient Details  Name: [name] [name]. MRN: [id number] Date of Birth: 02/11/1981   Condition Code 44 given:  Yes Patient signature on Condition Code 44 notice:  Yes Documentation of 2 MD's agreement:  Yes Code 44 added to claim:  Yes    Toy LITTIE Agar, RN 10/03/2023, 12:08 PM

## 2023-10-03 NOTE — Care Management Obs Status (Signed)
 MEDICARE OBSERVATION STATUS NOTIFICATION   Patient Details  Name: [name] [name]. MRN: [id number] Date of Birth: 1980/10/21   Medicare Observation Status Notification Given:  Yes    Toy LITTIE Agar, RN 10/03/2023, 12:00 PM
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
## 2023-10-03 NOTE — Care Management Obs Status (Signed)
 MEDICARE OBSERVATION STATUS NOTIFICATION   Patient Details  Name: [name] [name]. MRN: [id number] Date of Birth: 12-Aug-1980   Medicare Observation Status Notification Given:  Yes    Toy LITTIE Agar, RN 10/03/2023, 12:07 PM

## 2023-10-03 NOTE — Plan of Care (Signed)
  Problem: Education: Goal: Knowledge of vaso-occlusive preventative measures will improve Outcome: Progressing   Problem: Bowel/Gastric: Goal: Gut motility will be maintained Outcome: Progressing   Problem: Respiratory: Goal: Pulmonary complications will be avoided or minimized Outcome: Progressing   Problem: Sensory: Goal: Pain level will decrease with appropriate interventions Outcome: Progressing   Problem: Nutrition: Goal: Adequate nutrition will be maintained Outcome: Progressing   Problem: Coping: Goal: Level of anxiety will decrease Outcome: Progressing   Problem: Elimination: Goal: Will not experience complications related to bowel motility Outcome: Progressing Goal: Will not experience complications related to urinary retention Outcome: Progressing   Problem: Pain Managment: Goal: General experience of comfort will improve and/or be controlled Outcome: Progressing   Problem: Safety: Goal: Ability to remain free from injury will improve Outcome: Progressing   Problem: Skin Integrity: Goal: Risk for impaired skin integrity will decrease Outcome: Progressing

## 2023-10-03 NOTE — Discharge Summary (Signed)
 Physician Discharge Summary  [name] [name]. FMW:[id number] DOB: 1980-04-29 DOA: 10/02/2023  PCP: [name], NP  Admit date: 10/02/2023  Discharge date: 10/03/2023  Discharge Diagnoses:  Principal Problem:   Sickle cell anemia with pain (HCC) Active Problems:   TOBACCO ABUSE   CKD (chronic kidney disease), stage IV (HCC)   Leukocytosis   Anemia of chronic disease   Sickle cell anemia (HCC)   Discharge Condition: Stable  Disposition:  Pt is discharged home in good condition and is to follow up with [name], NP this week to have labs evaluated. Osceola A Puls Jr. is instructed to increase activity slowly and balance with rest for the next few days, and use prescribed medication to complete treatment of pain  Diet: Regular Wt Readings from Last 3 Encounters:  10/03/23 57 kg  08/20/23 58.1 kg  08/16/23 57 kg    History of present illness:  [name] [name]  is a 43 y.o. male , with history of sickle cell disease, Chronic pain syndrome, opiate dependence and tolerance, anemia of chronic disease, and CKD stage IV who presented to the day emergency department with major complaints of generalized body pain but primarily in his lower extremities and back, rates pain at 10/10. He describes this pain as constant and achy and throbbing.  Patient is reporting one fall in the last week, he fell on his right knee and today he is walking with a limp using a cane for ambulation.  Patient is scheduled to see orthopedic in the next few days. Patient denies cough, shortness of breath, headache, dizziness, nausea, vomiting, diarrhea.  No urinary symptoms.  ED Course:  Patient treated in the emergency department with IV fluid, IV pain medication with no resolution to pain symptoms. Patient admitted for ongoing sickle cell pain management BP 109/78 (BP Location: Right Arm)  Pulse (!) 119  Temp 98.9 F (37.2 C) (Oral)  Resp 18  SpO2 98%   Hospital Course:  Patient was  admitted for sickle cell pain crisis and managed appropriately with IVF, IV Dilaudid  via PCA, as well as other adjunct therapies per sickle cell pain management protocols. On admission patient was found to have AKI and gently hydrated. He did not wait to repeat labs today.  Patient was up and ready to leave at time of rounding, he insisted on discharge and reports that his pain is 2/10. Patients d/c request activated code 76 which was resolved and patient was able to discharge home. He was in a hemodynamically stable condition.     Harutyun was counseled extensively about nonpharmacologic means of pain management, patient verbalized understanding and was appreciative of  the care received during this admission.   We discussed the need for good hydration, monitoring of hydration status, avoidance of heat, cold, stress, and infection triggers. We discussed the need to be adherent with taking Hydrea  and other home medications. Patient was reminded of the need to seek medical attention immediately if any symptom of bleeding, anemia, or infection occurs.  Discharge Exam: Vitals:   10/03/23 0725 10/03/23 1214  BP:    Pulse:    Resp: 16 11  Temp:    SpO2:     Vitals:   10/03/23 0512 10/03/23 0514 10/03/23 0725 10/03/23 1214  BP: 98/88 104/79    Pulse: 91 87    Resp:   16 11  Temp:      TempSrc:      SpO2: 95%     Weight:  Height:        General appearance : Awake, alert, not in any distress. Speech Clear. Not toxic looking HEENT: Atraumatic and Normocephalic, pupils equally reactive to light and accomodation Neck: Supple, no JVD. No cervical lymphadenopathy.  Chest: Good air entry bilaterally, no added sounds  CVS: S1 S2 regular, no murmurs.  Abdomen: Bowel sounds present, Non tender and not distended with no gaurding, rigidity or rebound. Extremities: B/L Lower Ext shows no edema, both legs are warm to touch Neurology: Awake alert, and oriented X 3, CN II-XII intact, Non focal Skin:  No Rash  Discharge Instructions  Discharge Instructions     Call MD for:  severe uncontrolled pain   Complete by: As directed    Call MD for:  temperature >100.4   Complete by: As directed    Diet - low sodium heart healthy   Complete by: As directed    Increase activity slowly   Complete by: As directed       Allergies as of 10/03/2023       Reactions   Nsaids Other (See Comments)   Kidney disease   Morphine  Other (See Comments)   -Really bad headaches        Medication List     TAKE these medications    amitriptyline  50 MG tablet Commonly known as: ELAVIL  Take 100 mg by mouth at bedtime.   calcitRIOL  0.25 MCG capsule Commonly known as: ROCALTROL  Take 0.25 mcg by mouth daily with breakfast.   Ferriprox  Twice-A-Day 1000 MG Tabs Generic drug: Deferiprone  (Twice Daily) Take 3,000 mg by mouth daily.   folic acid  1 MG tablet Commonly known as: FOLVITE  TAKE 1 TABLET BY MOUTH EVERY DAY   hydroxyurea  500 MG capsule Commonly known as: HYDREA  Take 1 capsule by mouth in the morning on Sun/Tues/Wed/Thurs/Sat and 2 capsules on Mon/Fri   oxyCODONE  15 mg 12 hr tablet Commonly known as: OXYCONTIN  Take 1 tablet (15 mg) by mouth every 12 hours.   Oxycodone  HCl 10 MG Tabs Take 1 tablet (10 mg total) by mouth See admin instructions. Take 10 mg by mouth every 6 hours   sodium bicarbonate  650 MG tablet Take 1,950 mg by mouth 3 (three) times daily.   sodium zirconium cyclosilicate  10 g Pack packet Commonly known as: LOKELMA  Take 10 g by mouth daily.   Tylenol  8 Hour Arthritis Pain 650 MG CR tablet Generic drug: acetaminophen  Take 1,300 mg by mouth as needed for pain.        The results of significant diagnostics from this hospitalization (including imaging, microbiology, ancillary and laboratory) are listed below for reference.    Significant Diagnostic Studies: DG Knee Complete 4 Views Right Result Date: 10/02/2023 CLINICAL DATA:  Right knee pain EXAM: RIGHT  KNEE - COMPLETE 4+ VIEW COMPARISON:  August 06, 2023 FINDINGS: Status post right knee arthroplasty with proper position of the prosthetic device. Normal knee alignment . No suspicious lucency around the hardware. No evidence of fracture, dislocation. Query trace suprapatellar joint effusion. The no evidence of arthropathy or other focal bone abnormality. Soft tissues are unremarkable. IMPRESSION: Total knee arthroplasty.  No acute fracture or hardware abnormality. Electronically Signed   By: Megan  Zare M.D.   On: 10/02/2023 11:35    Microbiology: No results found for this or any previous visit (from the past 240 hours).   Labs: Basic Metabolic Panel: Recent Labs  Lab 09/26/23 1500 10/02/23 0906  NA 127* 137  K 5.2* 4.2  CL 94* 104  CO2 24 23  GLUCOSE 84 94  BUN 41* 45*  CREATININE 2.78* 3.45*  CALCIUM  7.8* 8.8*  PHOS 4.3  --    Liver Function Tests: Recent Labs  Lab 09/26/23 1500 10/02/23 0906  AST  --  54*  ALT  --  43  ALKPHOS  --  204*  BILITOT  --  1.5*  PROT  --  7.7  ALBUMIN  2.9* 3.4*   No results for input(s): LIPASE, AMYLASE in the last 168 hours. No results for input(s): AMMONIA in the last 168 hours. CBC: Recent Labs  Lab 10/02/23 0906  WBC 7.0  NEUTROABS 3.2  HGB 7.7*  HCT 23.1*  MCV 126.2*  PLT 142*   Cardiac Enzymes: No results for input(s): CKTOTAL, CKMB, CKMBINDEX, TROPONINI in the last 168 hours. BNP: Invalid input(s): POCBNP CBG: No results for input(s): GLUCAP in the last 168 hours.  Time coordinating discharge: 50 minutes  Signed:  Homer CHRISTELLA Cover NP   10/03/2023, 1:41 PM

## 2023-10-09 ENCOUNTER — Telehealth: Payer: Self-pay | Admitting: Nurse Practitioner

## 2023-10-09 NOTE — Telephone Encounter (Signed)
 Copied from CRM #[id number]. Topic: Clinical - Medication Refill >> Oct 09, 2023 10:28 AM Tobias L wrote: Medication:  Oxycodone  HCl 10 MG TABS    Has the patient contacted their pharmacy? No Needs to contact the office for further refills due to type of medication.  This is the patient's preferred pharmacy:  [facility] [address], [place] - [address] [place] [address] Phone: [phone] Fax: [phone]  Is this the correct pharmacy for this prescription? Yes  Has the prescription been filled recently? Yes  Is the patient out of the medication? Yes  Has the patient been seen for an appointment in the last year OR does the patient have an upcoming appointment? Yes  Can we respond through MyChart? No  Agent: Please be advised that Rx refills may take up to 3 business days. We ask that you follow-up with your pharmacy.

## 2023-10-10 ENCOUNTER — Non-Acute Institutional Stay (HOSPITAL_COMMUNITY)
Admit: 2023-10-10 | Discharge: 2023-10-10 | Disposition: A | Discharge status: Home / Self Care | Attending: Internal Medicine | Admitting: Internal Medicine

## 2023-10-10 ENCOUNTER — Other Ambulatory Visit: Payer: Self-pay

## 2023-10-10 VITALS — BP 99/76 | HR 87 | Temp 98.5°F | Resp 14

## 2023-10-10 DIAGNOSIS — G894 Chronic pain syndrome: Secondary | ICD-10-CM

## 2023-10-10 DIAGNOSIS — D5700 Hb-SS disease with crisis, unspecified: Secondary | ICD-10-CM | POA: Diagnosis present

## 2023-10-10 DIAGNOSIS — D571 Sickle-cell disease without crisis: Secondary | ICD-10-CM

## 2023-10-10 LAB — CBC
HCT: 22.5 % — ABNORMAL LOW (ref 39.0–52.0)
Hemoglobin: 7.4 g/dL — ABNORMAL LOW (ref 13.0–17.0)
MCH: 42 pg — ABNORMAL HIGH (ref 26.0–34.0)
MCHC: 32.9 g/dL (ref 30.0–36.0)
MCV: 127.8 fL — ABNORMAL HIGH (ref 80.0–100.0)
Platelets: 223 K/uL (ref 150–400)
RBC: 1.76 MIL/uL — ABNORMAL LOW (ref 4.22–5.81)
RDW: 25 % — ABNORMAL HIGH (ref 11.5–15.5)
WBC: 6.4 K/uL (ref 4.0–10.5)
nRBC: 0.8 % — ABNORMAL HIGH (ref 0.0–0.2)

## 2023-10-10 MED ORDER — OXYCODONE HCL ER 15 MG PO T12A: 15.0000 mg | EXTENDED_RELEASE_TABLET | Freq: Two times a day (BID) | ORAL | 0 refills | Status: DC

## 2023-10-10 MED ORDER — OXYCODONE HCL 10 MG PO TABS: 10.0000 mg | ORAL_TABLET | ORAL | 0 refills | Status: DC

## 2023-10-10 MED ORDER — DARBEPOETIN ALFA 150 MCG/0.3ML IJ SOSY
150.0000 ug | PREFILLED_SYRINGE | Freq: Once | INTRAMUSCULAR | Status: AC
  Administered 2023-10-10: 150 ug via SUBCUTANEOUS
  Filled 2023-10-10: qty 0.3

## 2023-10-10 NOTE — Progress Notes (Signed)
 PATIENT CARE CENTER NOTE   Diagnosis: anemia of chronic disease     Provider: Homer Cover FNP     Procedure: Aranesp  injection 150mcg and lab draw     Note: Patient labs drawn (CBC) and patient received sub-q Aranesp  injection in right arm. Pre-injection hemoglobin was 7.4; orders indicate to administer injection when hgb is under 11. Patient tolerated injection well. Vital signs wnl.  Patient declined printed AVS. Alert, oriented and ambulatory at discharge.

## 2023-10-22 ENCOUNTER — Ambulatory Visit: Payer: Self-pay | Admitting: Nurse Practitioner

## 2023-10-23 ENCOUNTER — Other Ambulatory Visit: Payer: Self-pay | Admitting: Nurse Practitioner

## 2023-10-23 DIAGNOSIS — G894 Chronic pain syndrome: Secondary | ICD-10-CM

## 2023-10-23 DIAGNOSIS — D571 Sickle-cell disease without crisis: Secondary | ICD-10-CM

## 2023-10-23 NOTE — Telephone Encounter (Unsigned)
 Copied from CRM #[id number]. Topic: Clinical - Medication Refill >> Oct 23, 2023 10:18 AM Debby BROCKS wrote: Medication: oxyCODONE  (OXYCONTIN ) 15 mg 12 hr tablet Oxycodone  HCl 10 MG TABS  Has the patient contacted their pharmacy? No (Agent: If no, request that the patient contact the pharmacy for the refill. If patient does not wish to contact the pharmacy document the reason why and proceed with request.) (Agent: If yes, when and what did the pharmacy advise?)  This is the patient's preferred pharmacy:  [facility] [address] [place] - [address] [place] [address] Phone: [phone] Fax: [phone]  Is this the correct pharmacy for this prescription? Yes If no, delete pharmacy and type the correct one.   Has the prescription been filled recently? No  Is the patient out of the medication? No, will be out by the end of the week  Has the patient been seen for an appointment in the last year OR does the patient have an upcoming appointment? Yes  Can we respond through MyChart? Yes  Agent: Please be advised that Rx refills may take up to 3 business days. We ask that you follow-up with your pharmacy.

## 2023-10-25 ENCOUNTER — Other Ambulatory Visit: Payer: Self-pay

## 2023-10-25 ENCOUNTER — Encounter: Payer: Self-pay | Admitting: Nurse Practitioner

## 2023-10-25 ENCOUNTER — Ambulatory Visit (INDEPENDENT_AMBULATORY_CARE_PROVIDER_SITE_OTHER): Payer: Self-pay | Admitting: Nurse Practitioner

## 2023-10-25 VITALS — BP 117/86 | HR 92 | Temp 98.3°F | Wt 125.6 lb

## 2023-10-25 DIAGNOSIS — G894 Chronic pain syndrome: Secondary | ICD-10-CM

## 2023-10-25 DIAGNOSIS — D571 Sickle-cell disease without crisis: Secondary | ICD-10-CM | POA: Diagnosis not present

## 2023-10-25 MED ORDER — OXYCODONE HCL ER 15 MG PO T12A: 15.0000 mg | EXTENDED_RELEASE_TABLET | Freq: Two times a day (BID) | ORAL | 0 refills | Status: DC

## 2023-10-25 MED ORDER — OXYCODONE HCL 10 MG PO TABS: 10.0000 mg | ORAL_TABLET | ORAL | 0 refills | Status: DC

## 2023-10-25 NOTE — Patient Instructions (Signed)
 Living With Sickle Cell Disease Living with a long-term condition, such as sickle cell disease, can be a challenge. It can affect both your physical and mental health. You may not have total control over your condition. But proper care and treatment can help manage the effects of the disease so you can feel good and lead an active life. You can take steps to manage your condition and stay as healthy as possible. How does sickle cell disease affect me? Sickle cell disease can cause challenges that affect your quality of life. You may get sick more often as a result of organ damage and infections. Sometimes you may need to stay in the hospital. Learn how to recognize that you are not feeling well and that you may be getting sick. What actions can I take to manage my condition?  The goals of treatment are to control your symptoms and prevent and treat problems. Work with your health care provider to create a treatment plan that works for you. Taking an active role in managing your condition can help you feel more in control of your situation. Ask about possible side effects of medicines that your health care provider recommends. Discuss how you feel about having those side effects. Keeping a healthy lifestyle can help you manage your condition. This includes eating a healthy diet, getting enough sleep, and getting regular exercise. Sickle cell disease may affect your ability to take care of your basic needs. Tell your health care provider if you have concerns about any of these needs: Access to food. Housing. Safe drinking water and other utilities. Safety in your home and community. Work or school. Transportation. Paying for health care. Your health care provider may be able to connect you with community resources that can help you. How to manage stress  Living with sickle cell disease can be stressful. This disease can have a big impact on your mental health. Talk with your health care provider  about ways to reduce your stress or if you have concerns about your mental health.  To cope with stress, try: Keeping a stress diary. This can help you learn what causes your stress to start (figure out your triggers) and how to control your response to those triggers. Spending time doing things that you enjoy, such as: Hobbies. Being outdoors. Spending time with friends and people who make you laugh. Doing yoga, muscle relaxation, deep breathing, or mindfulness practices. Expressing yourself through journal writing, art, crafting, poetry, or playing music. Staying positive about your health. Try to accept that you cannot control your condition perfectly. Follow these instructions at home: Medicines Take over-the-counter and prescription medicines only as told by your health care provider. If you were prescribed antibiotics, take them as told by your health care provider. Do not stop taking them even if you start to feel better. If you develop a fever, do not take medicines to reduce the fever right away. This could cover up another problem. Contact your health care provider. Eating and drinking Drink enough fluid to keep your urine pale yellow. Drink more in hot weather and during exercise. Limit or avoid drinking alcohol. Eat a balanced and nutritious diet. Eat plenty of fruits, vegetables, whole grains, and lean protein. Take vitamins and supplements as told by your health care provider. Traveling When traveling, keep these with you: Your medical information. The names of your health care providers. Your medicines. If you have to travel by air, ask about precautions you should take. Managing pain Work with  your health care provider to create a pain management plan that works for you. The plan may include: Ways to reduce or manage your pain at home, such as: Using a heating pad. Taking a warm bath. Using healthy ways to distract you from the pain, such as hobbies or  reading. Practicing ways to relax, such as doing yoga or listening to music. Getting massages. Doing exercises or stretches as told by a physical therapist. Tracking how pain affects your daily life functions. When to seek help. Who to contact and what to do in case of a pain emergency. General instructions Do not use any products that contain nicotine or tobacco. These products include cigarettes, chewing tobacco, and vaping devices, such as e-cigarettes. These lower blood oxygen levels. If you need help quitting, ask your health care provider. Consider wearing a medical alert bracelet. Use an app or journal to track your symptoms, assess your level of pain and fatigue, and keep track of your medicines. Avoid the following: High altitudes. Very high or low temperatures and big changes in temperature. Activities that will lower your oxygen levels, such as mountain climbing or doing exercise that takes a lot of effort. Stay up to date on: Your treatment plan. Learn as much as you can about your condition. Health screenings. This will help prevent problems or catch them early on. Vaccines. This will help prevent infection. Wash your hands often with soap and water to help prevent infections. Wash them for at least 20 seconds each time. Keep all follow-up visits. Regular follow-up with your health care provider can help you better manage your condition. Where to find support You can find help and support through: Talking with a therapist or taking part in support groups. Sickle Cell Disease Foundation of [place]: www.sicklecelldisease.org Where to find more information Centers for Disease Control and Prevention: FootballExhibition.com.br American Society of Hematology: www.hematology.org Contact a health care provider if: Your symptoms get worse. You have new symptoms. You have a fever. Get help right away if: You have a painful erection of the penis that lasts a long time (priapism). You become  short of breath or are having trouble breathing. You have pain that cannot be controlled with medicine. You have any signs of a stroke. "BE FAST" is an easy way to remember the main warning signs: B - Balance. Dizziness, sudden trouble walking, or loss of balance. E - Eyes. Trouble seeing or a change in how you see. F - Face. Sudden weakness or loss of feeling of the face. The face or eyelid may droop on one side. A - Arms. Weakness or loss of feeling in an arm. This happens all of a sudden and most often on one side of the body. S - Speech. Sudden trouble speaking, slurred speech, or trouble understanding what people say. T - Time. Time to call emergency services. Write down what time symptoms started. You have other signs of a stroke, such as: A sudden, very bad headache with no known cause. Feeling like you may vomit (nausea). Vomiting. Seizure. These symptoms may be an emergency. Get help right away. Call 911. Do not wait to see if the symptoms will go away. Do not drive yourself to the hospital. Also, get help right away if: You have strong feelings of sadness or loss of hope, or you have thoughts about hurting yourself or others. Take one of these steps if you feel like you may hurt yourself or others, or have thoughts about taking your own life:  Go to your nearest emergency room. Call 911. Call the National Suicide Prevention Lifeline at (915)371-4213 or 988. This is open 24 hours a day. Text the Crisis Text Line at (909)230-5332. Summary Proper care and treatment can help manage the effects of sickle cell disease so you can feel good and lead an active life. The goals of treatment are to control your symptoms and prevent and treat problems. Taking an active role in managing your condition can help you feel more in control of your situation. Work with your health care provider to create a pain management plan that works for you. Get medical help right away as told by your health care  provider. This information is not intended to replace advice given to you by your health care provider. Make sure you discuss any questions you have with your health care provider. Document Revised: 05/09/2021 Document Reviewed: 05/09/2021 Elsevier Patient Education  2024 [employer].

## 2023-10-25 NOTE — Progress Notes (Signed)
 Subjective   Patient ID: [name] [name]., male    DOB: April 20, 1980, 43 y.o.   MRN: [id number]  Chief Complaint  Patient presents with   Medical Management of Chronic Issues    Follow up    Referring provider: [name], NP  [name] [name]. is a 43 y.o. male with Past Medical History: 09/24/2018: Abscess of right iliac fossa     Comment:  required Perc Drain 09/24/2018 No date: Arachnoid Cyst of brain bilaterally     Comment:  2 really small ones in the back of my head; inside; saw              them w/MRI (09/25/2012) ~ 2012: Bacterial pneumonia     Comment:  caught it here in the hospital (09/25/2012) No date: Chronic kidney disease     Comment:  from my sickle cell (09/25/2012) No date: CKD (chronic kidney disease), stage II 04/19/2017: Colitis     Comment:  CT scan abd/ pelvis No date: GERD (gastroesophageal reflux disease)     Comment:  after I eat alot of spicey foods (09/25/2012) 07/10/2012: Gynecomastia, male No date: History of blood transfusion     Comment:  always related to sickle cell crisis (09/25/2012) 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     Comment:  Noted in noted from Hematology notes at [facility] No date: Migraines     Comment:  take RX qd to prevent them (09/25/2012) 08/30/2012: Opioid dependence with withdrawal (HCC) No date: Renal insufficiency No date: Sickle cell anemia (HCC) 09/25/2012: Sickle cell crisis (HCC) 07/10/2012: Sickle cell nephropathy (HCC) No date: Sinus tachycardia 08/04/2016: Tachycardia with heart rate 121-140 beats per minute with  ambulation   HPI  Patient presents today for follow-up visit.  He is following with Dr. [name].  He would like refills on his pain medicine today.  This will be forwarded to Dr. [name] for review.  Patient does need labs today.  Overall he is stable today. Denies f/c/s, n/v/d, hemoptysis, PND, leg swelling Denies chest pain or  edema     Allergies  Allergen Reactions   Nsaids Other (See Comments)    Kidney disease   Morphine  Other (See Comments)    -Really bad headaches    Immunization History  Administered Date(s) Administered   Influenza Split 12/08/2011   Influenza,inj,Quad PF,6+ Mos 10/11/2012, 12/13/2016, 11/15/2017, 01/06/2019, 01/28/2019, 01/13/2020, 12/15/2020, 01/24/2022   Influenza-Unspecified 10/11/2012, 12/13/2016   Moderna Sars-Covid-2 Vaccination 05/29/2019, 06/26/2019   Pneumococcal Conjugate-13 08/23/2017   Pneumococcal Polysaccharide-23 12/13/2016   Td 09/18/2004   Tdap 06/13/2017    Tobacco History: Social History   Tobacco Use  Smoking Status Former   Current packs/day: 0.10   Types: Cigarettes  Smokeless Tobacco Never   Counseling given: Not Answered   Outpatient Encounter Medications as of 10/25/2023  Medication Sig   amitriptyline  (ELAVIL ) 50 MG tablet Take 100 mg by mouth at bedtime.   calcitRIOL  (ROCALTROL ) 0.25 MCG capsule Take 0.25 mcg by mouth daily with breakfast.   Deferiprone , Twice Daily, (FERRIPROX  TWICE-A-DAY) 1000 MG TABS Take 3,000 mg by mouth daily.   folic acid  (FOLVITE ) 1 MG tablet TAKE 1 TABLET BY MOUTH EVERY DAY   hydroxyurea  (HYDREA ) 500 MG capsule Take 1 capsule by mouth in the morning on Sun/Tues/Wed/Thurs/Sat and 2 capsules on Mon/Fri   oxyCODONE  (OXYCONTIN ) 15 mg 12 hr tablet Take 1 tablet (15 mg) by mouth  every 12 hours.   Oxycodone  HCl 10 MG TABS Take 1 tablet (10 mg total) by mouth See admin instructions. Take 10 mg by mouth every 6 hours   sodium bicarbonate  650 MG tablet Take 1,950 mg by mouth 3 (three) times daily.   sodium zirconium cyclosilicate  (LOKELMA ) 10 g PACK packet Take 10 g by mouth daily.   TYLENOL  8 HOUR ARTHRITIS PAIN 650 MG CR tablet Take 1,300 mg by mouth as needed for pain.   Facility-Administered Encounter Medications as of 10/25/2023  Medication   Darbepoetin Alfa  (ARANESP ) injection 150 mcg    Review of  Systems  Review of Systems  Constitutional: Negative.   HENT: Negative.    Cardiovascular: Negative.   Gastrointestinal: Negative.   Allergic/Immunologic: Negative.   Neurological: Negative.   Psychiatric/Behavioral: Negative.       Objective:   BP 117/86   Pulse 92   Temp 98.3 F (36.8 C) (Oral)   Wt 125 lb 9.6 oz (57 kg)   SpO2 100%   BMI 22.25 kg/m   Wt Readings from Last 5 Encounters:  10/25/23 125 lb 9.6 oz (57 kg)  10/03/23 125 lb 10.6 oz (57 kg)  08/20/23 128 lb (58.1 kg)  08/16/23 125 lb 9.6 oz (57 kg)  08/06/23 130 lb 1.1 oz (59 kg)     Physical Exam Vitals and nursing note reviewed.  Constitutional:      General: He is not in acute distress.    Appearance: He is well-developed.  Cardiovascular:     Rate and Rhythm: Normal rate and regular rhythm.  Pulmonary:     Effort: Pulmonary effort is normal.     Breath sounds: Normal breath sounds.  Skin:    General: Skin is warm and dry.  Neurological:     Mental Status: He is alert and oriented to person, place, and time.       Assessment & Plan:   Hb-SS disease without crisis (HCC) -     ToxAssure Flex 15, Ur -     Sickle Cell Panel     Return in about 3 months (around 01/24/2024).   Bascom GORMAN Borer, NP 10/25/2023

## 2023-10-26 ENCOUNTER — Ambulatory Visit: Payer: Self-pay | Admitting: Nurse Practitioner

## 2023-10-26 LAB — CMP14+CBC/D/PLT+FER+RETIC+V...
ALT: 46 IU/L — ABNORMAL HIGH (ref 0–44)
AST: 59 IU/L — ABNORMAL HIGH (ref 0–40)
Albumin: 3.9 g/dL — ABNORMAL LOW (ref 4.1–5.1)
Alkaline Phosphatase: 241 IU/L — ABNORMAL HIGH (ref 44–121)
BUN/Creatinine Ratio: 15 (ref 9–20)
BUN: 39 mg/dL — ABNORMAL HIGH (ref 6–24)
Basophils Absolute: 0 x10E3/uL (ref 0.0–0.2)
Basos: 0 %
Bilirubin Total: 0.9 mg/dL (ref 0.0–1.2)
CO2: 21 mmol/L (ref 20–29)
Calcium: 8.8 mg/dL (ref 8.7–10.2)
Chloride: 102 mmol/L (ref 96–106)
Creatinine, Ser: 2.63 mg/dL — ABNORMAL HIGH (ref 0.76–1.27)
EOS (ABSOLUTE): 0 x10E3/uL (ref 0.0–0.4)
Eos: 0 %
Ferritin: 3646 ng/mL — ABNORMAL HIGH (ref 30–400)
Globulin, Total: 3.1 g/dL (ref 1.5–4.5)
Glucose: 86 mg/dL (ref 70–99)
Hematocrit: 22.2 % — ABNORMAL LOW (ref 37.5–51.0)
Hemoglobin: 7.3 g/dL — ABNORMAL LOW (ref 13.0–17.7)
Immature Grans (Abs): 0 x10E3/uL (ref 0.0–0.1)
Immature Granulocytes: 0 %
Lymphocytes Absolute: 2.8 x10E3/uL (ref 0.7–3.1)
Lymphs: 69 %
MCH: 44.8 pg — ABNORMAL HIGH (ref 26.6–33.0)
MCHC: 32.9 g/dL (ref 31.5–35.7)
MCV: 136 fL — ABNORMAL HIGH (ref 79–97)
Monocytes Absolute: 0.1 x10E3/uL (ref 0.1–0.9)
Monocytes: 2 %
NRBC: 1 % — ABNORMAL HIGH (ref 0–0)
Neutrophils Absolute: 1.2 x10E3/uL — ABNORMAL LOW (ref 1.4–7.0)
Neutrophils: 29 %
Platelets: 85 x10E3/uL — CL (ref 150–450)
Potassium: 4.9 mmol/L (ref 3.5–5.2)
RBC: 1.63 x10E6/uL — CL (ref 4.14–5.80)
RDW: 19.5 % — ABNORMAL HIGH (ref 11.6–15.4)
Retic Ct Pct: 2.9 % — ABNORMAL HIGH (ref 0.6–2.6)
Sodium: 138 mmol/L (ref 134–144)
Total Protein: 7 g/dL (ref 6.0–8.5)
Vit D, 25-Hydroxy: 15.8 ng/mL — ABNORMAL LOW (ref 30.0–100.0)
WBC: 4 x10E3/uL (ref 3.4–10.8)
eGFR: 31 mL/min/1.73 — ABNORMAL LOW (ref >59)

## 2023-10-26 NOTE — Telephone Encounter (Signed)
 Attempted to reach patient regarding results and recommendation, unable to leave message.  Will send mychart message.  Ok for E2C2 to discuss with patient.

## 2023-10-26 NOTE — Telephone Encounter (Signed)
-----   Message from Bascom GORMAN Borer sent at 10/26/2023  9:35 AM EDT ----- Vitamin D  is low.  RBC and platelet counts are low.  If patient is symptomatic please go to the ED. ----- Message ----- From: Interface, [facility] Lab Results In Sent: 10/26/2023   7:39 AM EDT To: Bascom GORMAN Borer, NP

## 2023-10-30 LAB — TOXASSURE FLEX 15, UR
6-ACETYLMORPHINE IA: NEGATIVE ng/mL
7-aminoclonazepam: NOT DETECTED ng/mg{creat}
AMPHETAMINES IA: NEGATIVE ng/mL
Alpha-hydroxyalprazolam: NOT DETECTED ng/mg{creat}
Alpha-hydroxymidazolam: NOT DETECTED ng/mg{creat}
Alpha-hydroxytriazolam: NOT DETECTED ng/mg{creat}
Alprazolam: NOT DETECTED ng/mg{creat}
BARBITURATES IA: NEGATIVE ng/mL
BUPRENORPHINE: NEGATIVE
Benzodiazepines: NEGATIVE
Buprenorphine: NOT DETECTED ng/mg{creat}
COCAINE METABOLITE IA: NEGATIVE ng/mL
Clonazepam: NOT DETECTED ng/mg{creat}
Creatinine: 52 mg/dL (ref >=20)
Desalkylflurazepam: NOT DETECTED ng/mg{creat}
Desmethyldiazepam: NOT DETECTED ng/mg{creat}
Desmethylflunitrazepam: NOT DETECTED ng/mg{creat}
Diazepam: NOT DETECTED ng/mg{creat}
ETHYL ALCOHOL Enzymatic: NEGATIVE g/dL
FENTANYL: NEGATIVE
Fentanyl: NOT DETECTED ng/mg{creat}
Flunitrazepam: NOT DETECTED ng/mg{creat}
Lorazepam: NOT DETECTED ng/mg{creat}
METHADONE IA: NEGATIVE ng/mL
METHADONE MTB IA: NEGATIVE ng/mL
Midazolam: NOT DETECTED ng/mg{creat}
Norbuprenorphine: NOT DETECTED ng/mg{creat}
Norfentanyl: NOT DETECTED ng/mg{creat}
Oxazepam: NOT DETECTED ng/mg{creat}
PHENCYCLIDINE IA: NEGATIVE ng/mL
TAPENTADOL, IA: NEGATIVE ng/mL
TRAMADOL IA: NEGATIVE ng/mL
Temazepam: NOT DETECTED ng/mg{creat}

## 2023-10-30 LAB — OPIATE CLASS, MS, UR RFX
Codeine: NOT DETECTED ng/mg{creat}
Dihydrocodeine: NOT DETECTED ng/mg{creat}
Hydrocodone: NOT DETECTED ng/mg{creat}
Hydromorphone: NOT DETECTED ng/mg{creat}
Morphine: NOT DETECTED ng/mg{creat}
Norcodeine: NOT DETECTED ng/mg{creat}
Norhydrocodone: NOT DETECTED ng/mg{creat}
Normorphine: NOT DETECTED ng/mg{creat}
Opiate Class Confirmation: NEGATIVE

## 2023-10-30 LAB — OXYCODONE CLASS, MS, UR RFX
Noroxycodone: 1373 ng/mg{creat}
Noroxymorphone: 1012 ng/mg{creat}
Oxycodone Class Confirmation: POSITIVE
Oxycodone: 600 ng/mg{creat}
Oxymorphone: 792 ng/mg{creat}

## 2023-10-30 LAB — CANNABINOIDS, MS, UR RFX
Cannabinoids Confirmation: POSITIVE
Carboxy-THC: 42 ng/mg{creat}

## 2023-11-01 ENCOUNTER — Telehealth (HOSPITAL_COMMUNITY): Payer: Self-pay | Admitting: *Deleted

## 2023-11-01 ENCOUNTER — Observation Stay (HOSPITAL_COMMUNITY)
Admission: AD | Admit: 2023-11-01 | Discharge: 2023-11-02 | Disposition: A | Discharge status: Home / Self Care | Attending: Internal Medicine | Admitting: Internal Medicine

## 2023-11-01 DIAGNOSIS — Z79899 Other long term (current) drug therapy: Secondary | ICD-10-CM | POA: Diagnosis not present

## 2023-11-01 DIAGNOSIS — D5700 Hb-SS disease with crisis, unspecified: Principal | ICD-10-CM | POA: Insufficient documentation

## 2023-11-01 DIAGNOSIS — N189 Chronic kidney disease, unspecified: Secondary | ICD-10-CM | POA: Diagnosis not present

## 2023-11-01 DIAGNOSIS — D631 Anemia in chronic kidney disease: Secondary | ICD-10-CM | POA: Insufficient documentation

## 2023-11-01 LAB — CBC WITH DIFFERENTIAL/PLATELET
Abs Immature Granulocytes: 0.02 K/uL (ref 0.00–0.07)
Basophils Absolute: 0 K/uL (ref 0.0–0.1)
Basophils Relative: 0 %
Eosinophils Absolute: 0 K/uL (ref 0.0–0.5)
Eosinophils Relative: 1 %
HCT: 17.9 % — ABNORMAL LOW (ref 39.0–52.0)
Hemoglobin: 6.2 g/dL — CL (ref 13.0–17.0)
Immature Granulocytes: 0 %
Lymphocytes Relative: 62 %
Lymphs Abs: 3.3 K/uL (ref 0.7–4.0)
MCH: 46.6 pg — ABNORMAL HIGH (ref 26.0–34.0)
MCHC: 34.6 g/dL (ref 30.0–36.0)
MCV: 134.6 fL — ABNORMAL HIGH (ref 80.0–100.0)
Monocytes Absolute: 0.2 K/uL (ref 0.1–1.0)
Monocytes Relative: 3 %
Neutro Abs: 1.8 K/uL (ref 1.7–7.7)
Neutrophils Relative %: 34 %
Platelets: 83 K/uL — ABNORMAL LOW (ref 150–400)
RBC: 1.33 MIL/uL — ABNORMAL LOW (ref 4.22–5.81)
RDW: 18.9 % — ABNORMAL HIGH (ref 11.5–15.5)
Smear Review: NORMAL
WBC: 5.4 K/uL (ref 4.0–10.5)
nRBC: 1.3 % — ABNORMAL HIGH (ref 0.0–0.2)

## 2023-11-01 LAB — COMPREHENSIVE METABOLIC PANEL WITH GFR
ALT: 110 U/L — ABNORMAL HIGH (ref 0–44)
AST: 103 U/L — ABNORMAL HIGH (ref 15–41)
Albumin: 4 g/dL (ref 3.5–5.0)
Alkaline Phosphatase: 270 U/L — ABNORMAL HIGH (ref 38–126)
Anion gap: 16 — ABNORMAL HIGH (ref 5–15)
BUN: 36 mg/dL — ABNORMAL HIGH (ref 6–20)
CO2: 18 mmol/L — ABNORMAL LOW (ref 22–32)
Calcium: 9.3 mg/dL (ref 8.9–10.3)
Chloride: 103 mmol/L (ref 98–111)
Creatinine, Ser: 3.08 mg/dL — ABNORMAL HIGH (ref 0.61–1.24)
GFR, Estimated: 25 mL/min — ABNORMAL LOW (ref >60)
Glucose, Bld: 107 mg/dL — ABNORMAL HIGH (ref 70–99)
Potassium: 4.8 mmol/L (ref 3.5–5.1)
Sodium: 137 mmol/L (ref 135–145)
Total Bilirubin: 0.8 mg/dL (ref 0.0–1.2)
Total Protein: 7.6 g/dL (ref 6.5–8.1)

## 2023-11-01 LAB — RETICULOCYTES
Immature Retic Fract: 18.3 % — ABNORMAL HIGH (ref 2.3–15.9)
RBC.: 1.32 MIL/uL — ABNORMAL LOW (ref 4.22–5.81)
Retic Count, Absolute: 19.5 K/uL (ref 19.0–186.0)
Retic Ct Pct: 1.5 % (ref 0.4–3.1)

## 2023-11-01 LAB — PREPARE RBC (CROSSMATCH)

## 2023-11-01 LAB — LACTATE DEHYDROGENASE: LDH: 162 U/L (ref 98–192)

## 2023-11-01 MED ORDER — SODIUM CHLORIDE 0.45 % IV SOLN: INTRAVENOUS | Status: AC

## 2023-11-01 MED ORDER — SODIUM CHLORIDE 0.9% FLUSH: 9.0000 mL | INTRAVENOUS | Status: DC | PRN

## 2023-11-01 MED ORDER — POLYETHYLENE GLYCOL 3350 17 G PO PACK: 17.0000 g | PACK | Freq: Every day | ORAL | Status: DC | PRN

## 2023-11-01 MED ORDER — ONDANSETRON HCL 4 MG/2ML IJ SOLN
4.0000 mg | Freq: Four times a day (QID) | INTRAMUSCULAR | Status: DC | PRN
  Administered 2023-11-01: 4 mg via INTRAVENOUS
  Filled 2023-11-01: qty 2

## 2023-11-01 MED ORDER — ACETAMINOPHEN 500 MG PO TABS
1000.0000 mg | ORAL_TABLET | Freq: Once | ORAL | Status: AC
  Administered 2023-11-01: 1000 mg via ORAL
  Filled 2023-11-01: qty 2

## 2023-11-01 MED ORDER — SODIUM CHLORIDE 0.9% IV SOLUTION: Freq: Once | INTRAVENOUS | Status: DC

## 2023-11-01 MED ORDER — DIPHENHYDRAMINE HCL 25 MG PO CAPS
25.0000 mg | ORAL_CAPSULE | ORAL | Status: DC | PRN
  Administered 2023-11-01: 25 mg via ORAL
  Filled 2023-11-01: qty 1

## 2023-11-01 MED ORDER — HYDROMORPHONE 1 MG/ML IV SOLN
INTRAVENOUS | Status: DC
  Administered 2023-11-01: 30 mg via INTRAVENOUS
  Administered 2023-11-01: 8.5 mg via INTRAVENOUS
  Filled 2023-11-01: qty 30

## 2023-11-01 MED ORDER — NALOXONE HCL 0.4 MG/ML IJ SOLN: 0.4000 mg | INTRAMUSCULAR | Status: DC | PRN

## 2023-11-01 MED ORDER — SENNOSIDES-DOCUSATE SODIUM 8.6-50 MG PO TABS
1.0000 | ORAL_TABLET | Freq: Two times a day (BID) | ORAL | Status: DC
  Administered 2023-11-01: 1 via ORAL
  Filled 2023-11-01: qty 1

## 2023-11-01 MED ORDER — SODIUM CHLORIDE 0.45 % IV SOLN: INTRAVENOUS | Status: DC

## 2023-11-01 NOTE — H&P (Signed)
 [facility] History and Physical  [name] [name]. FMW:[id number] DOB: 02-Feb-1981 DOA: 11/01/2023  PCP: [name], NP   Chief Complaint:  Chief Complaint  Patient presents with   Sickle Cell Pain Crisis    HPI: [name] [name]. is a 43 y.o. male with history of sickle cell disease, Chronic pain syndrome, opiate dependence and tolerance, anemia of chronic disease, and CKD stage IV who presented to the day hospital with major complaints of generalized body pain but primarily in his lower extremities and back, rates pain at 9/10. He describes this pain as constant and achy and throbbing.  Patient is reporting one fall in the last week, he fell on his right knee and today he is walking with a limp using a cane for ambulation. Patient is scheduled to see orthopedic in the next few days. Patient denies cough, shortness of breath, headache, dizziness, nausea, vomiting, diarrhea.  No urinary symptoms.  Day hospital course: Patient presented to the day hospital for worsening pain and generalize weakness.  Pain is unresolved after IV hydration and Dilaudid , labs returned with Hgb of 6.2g/dl. patient will be admitted for anemia, generalized weakness in the setting of sickle cell disease pain.  Vitals:   11/01/23 1100 11/01/23 1255  BP:  108/79  Pulse:  88  Resp: 16 16  Temp:    SpO2:  97%     Systemic Review: General: The patient denies anorexia, fever, weight loss Cardiac: Denies chest pain, syncope, palpitations, pedal edema  Respiratory: Denies cough, shortness of breath, wheezing GI: Denies severe indigestion/heartburn, abdominal pain, nausea, vomiting, diarrhea and constipation GU: Denies hematuria, incontinence, dysuria  Musculoskeletal: Bilateral lower extremity pain worse in the right knee.   Skin: Denies suspicious skin lesions Neurologic: Denies focal weakness or numbness, change in vision  Past Medical History:  Diagnosis Date   Abscess of  right iliac fossa 09/24/2018   required Perc Drain 09/24/2018   Arachnoid Cyst of brain bilaterally    2 really small ones in the back of my head; inside; saw them w/MRI (09/25/2012)   Bacterial pneumonia ~ 2012   caught it here in the hospital (09/25/2012)   Chronic kidney disease    from my sickle cell (09/25/2012)   CKD (chronic kidney disease), stage II    Colitis 04/19/2017   CT scan abd/ pelvis   GERD (gastroesophageal reflux disease)    after I eat alot of spicey foods (09/25/2012)   Gynecomastia, male 07/10/2012   History of blood transfusion    always related to sickle cell crisis (09/25/2012)   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   Noted in noted from Hematology notes at [facility]   Migraines    take RX qd to prevent them (09/25/2012)   Opioid dependence with withdrawal (HCC) 08/30/2012   Renal insufficiency    Sickle cell anemia (HCC)    Sickle cell crisis (HCC) 09/25/2012   Sickle cell nephropathy (HCC) 07/10/2012   Sinus tachycardia    Tachycardia with heart rate 121-140 beats per minute with ambulation 08/04/2016    Past Surgical History:  Procedure Laterality Date   ABCESS DRAINAGE     CHOLECYSTECTOMY  ~ 2012   COLONOSCOPY N/A 04/23/2017   Procedure: COLONOSCOPY;  Surgeon: [name], MD;  Location: [facility];  Service: Endoscopy;  Laterality: N/A;   IR FLUORO GUIDE CV LINE RIGHT  12/17/2016   IR FLUORO GUIDE CV LINE RIGHT  02/07/2021   IR RADIOLOGIST EVAL & MGMT  10/02/2018   IR RADIOLOGIST EVAL & MGMT  10/15/2018   IR REMOVAL TUN CV CATH W/O FL  12/21/2016   IR REMOVAL TUN CV CATH W/O FL  02/11/2021   IR US  GUIDE VASC ACCESS RIGHT  12/17/2016   IR US  GUIDE VASC ACCESS RIGHT  02/07/2021   spleenectomy      Allergies  Allergen Reactions   Nsaids Other (See Comments)    Kidney disease   Morphine  Other (See Comments)    -Really bad headaches    Family History  Problem Relation Age of Onset   Breast cancer Mother       Prior to  Admission medications   Medication Sig Start Date End Date Taking? Authorizing Provider  amitriptyline  (ELAVIL ) 25 MG tablet TAKE 2 TABLETS(50 MG) BY MOUTH AT BEDTIME 08/29/23   Nichols, [name], NP  calcitRIOL  (ROCALTROL ) 0.25 MCG capsule Take 0.25 mcg by mouth daily with breakfast. 11/16/20   [provider]  Deferiprone , Twice Daily, (FERRIPROX  TWICE-A-DAY) 1000 MG TABS Take 4,000 mg by mouth daily. 05/01/22   Tilford Bertram HERO, FNP  folic acid  (FOLVITE ) 1 MG tablet TAKE 1 TABLET BY MOUTH EVERY DAY 03/22/20   Hollis, Lachina M, FNP  hydroxyurea  (HYDREA ) 500 MG capsule Take 1 capsule by mouth in the morning on Sun/Tues/Wed/Thurs/Sat and 2 capsules on Mon/Fri 01/25/23   Nichols, [name], NP  oxyCODONE  (OXYCONTIN ) 15 mg 12 hr tablet Take 1 tablet (15 mg) by mouth every 12 hours. 08/16/23   [name], [name], MD  Oxycodone  HCl 10 MG TABS Take 1 tablet (10 mg total) by mouth See admin instructions. Take 10 mg by mouth every 6 hours 09/05/23   [name], [name], MD  sodium bicarbonate  650 MG tablet Take 1,950 mg by mouth 3 (three) times daily. 11/07/18   [provider]  sodium zirconium cyclosilicate  (LOKELMA ) 10 g PACK packet Take 10 g by mouth daily. 04/21/23 04/22/24  [provider]  TYLENOL  8 HOUR ARTHRITIS PAIN 650 MG CR tablet Take 1,300 mg by mouth as needed for pain.    [provider]     Physical Exam: There were no vitals filed for this visit.   General: Alert, awake, afebrile, anicteric, not in obvious distress HEENT: Normocephalic and Atraumatic, Mucous membranes pink                PERRLA; EOM intact; No scleral icterus,                 Nares: Patent, Oropharynx: Clear, Fair Dentition                 Neck: FROM, no cervical lymphadenopathy, thyromegaly, carotid bruit or JVD;  CHEST WALL: No tenderness  CHEST: Normal respiration, clear to auscultation bilaterally  HEART: Regular rate and rhythm; no murmurs rubs or gallops  BACK: No kyphosis or  scoliosis; no CVA tenderness  ABDOMEN: Positive Bowel Sounds, soft, non-tender; no masses, no organomegaly EXTREMITIES: No cyanosis, clubbing, or edema SKIN:  no rash or ulceration  CNS: Alert and Oriented x 4, Nonfocal exam, CN 2-12 intact  Labs on Admission:  Basic Metabolic Panel: Recent Labs  Lab 10/25/23 1032  NA 138  K 4.9  CL 102  CO2 21  GLUCOSE 86  BUN 39*  CREATININE 2.63*  CALCIUM  8.8   Liver Function Tests: Recent Labs  Lab 10/25/23 1032  AST 59*  ALT 46*  ALKPHOS 241*  BILITOT 0.9  PROT 7.0  ALBUMIN  3.9*  No results for input(s): LIPASE, AMYLASE in the last 168 hours. No results for input(s): AMMONIA in the last 168 hours. CBC: Recent Labs  Lab 10/25/23 1032  WBC 4.0  NEUTROABS 1.2*  HGB 7.3*  HCT 22.2*  MCV 136*  PLT 85*   Cardiac Enzymes: No results for input(s): CKTOTAL, CKMB, CKMBINDEX, TROPONINI in the last 168 hours.  BNP (last 3 results) No results for input(s): BNP in the last 8760 hours.  ProBNP (last 3 results) No results for input(s): PROBNP in the last 8760 hours.  CBG: No results for input(s): GLUCAP in the last 168 hours.   Assessment/Plan Active Problems:   * No active hospital problems. *  Admits to the Day Hospital for extended observation IVF 0 .45% Saline @ 125 mls/hour Weight based Dilaudid  PCA started within 30 minutes of admission Acetaminophen  1000 mg x 1 dose Labs: CBCD, CMP, Retic Count and LDH Monitor vitals very closely, Re-evaluate pain scale every hour 2 L of Oxygen  by [place] Patient will be re-evaluated for pain in the context of function and relationship to baseline as care progresses. If no significant relieve from pain (remains above 5/10) will transfer patient to inpatient services for further evaluation and management  Code Status: Full  Family Communication: None  DVT Prophylaxis: Ambulate as tolerated   Time spent: 35 Minutes  Homer CHRISTELLA Cover NP  If 7PM-7AM, please  contact night-coverage [email or web address] 11/01/2023, 10:16 AM

## 2023-11-01 NOTE — Progress Notes (Signed)
 Pt admitted to the day hospital today for sickle cell pain treatment. On arrival, patient reports 10/10 generalized pain. Pt received Dilaudid  PCA, IV Zofran , and hydrated with IV Fluids via PIV. Pt also received PO Tylenol  and Benadryl . Pts Hgb resulted at 6.2, Homer Cover, NP notified and per provider pt will be admitted to inpatient for blood transfusion and pain management. Pts CMP and LDH hemolyzed, provider notified that there is no phlebotomist at the clinic today, and we are unable to do a re-collect, pt will have labs drawn when admitted. Report given to Damien, RN. Pts PIV remains infusing PCA and IV Fluids at transfer. PCA settings verified with Rosina HERO, RN.Pt reports 7/10 pain at transfer. Pt is alert, oriented, and ambulatory at transfer.  Pt transferred to [address], bed 1505 in wheelchair by RN.

## 2023-11-01 NOTE — Telephone Encounter (Signed)
 Patient called requesting to come to the day hospital for sickle cell pain. Patient reports back and bilateral leg pain rated 10/10. Patient also reports feeling tired, light-headed and having dark urine. Patient is concerned about his kidney function. Denies fever, chest pain, nausea, vomiting and diarrhea. Admits to having transportation without driving self. Per patient, his mother will be his transportation. [name], NP notified and advised that patient come to the day hospital for pain management. Patient will need to follow-up with his PCP regarding his kidney function. Patient advised and expresses an understanding.

## 2023-11-01 NOTE — Plan of Care (Signed)

## 2023-11-02 DIAGNOSIS — D5700 Hb-SS disease with crisis, unspecified: Secondary | ICD-10-CM | POA: Diagnosis not present

## 2023-11-02 LAB — TYPE AND SCREEN
ABO/RH(D): O POS
Antibody Screen: NEGATIVE
Donor AG Type: NEGATIVE
Unit division: 0

## 2023-11-02 LAB — BASIC METABOLIC PANEL WITH GFR
Anion gap: 13 (ref 5–15)
BUN: 37 mg/dL — ABNORMAL HIGH (ref 6–20)
CO2: 20 mmol/L — ABNORMAL LOW (ref 22–32)
Calcium: 9.3 mg/dL (ref 8.9–10.3)
Chloride: 103 mmol/L (ref 98–111)
Creatinine, Ser: 2.45 mg/dL — ABNORMAL HIGH (ref 0.61–1.24)
GFR, Estimated: 33 mL/min — ABNORMAL LOW (ref >60)
Glucose, Bld: 107 mg/dL — ABNORMAL HIGH (ref 70–99)
Potassium: 4.8 mmol/L (ref 3.5–5.1)
Sodium: 136 mmol/L (ref 135–145)

## 2023-11-02 LAB — BPAM RBC
Blood Product Expiration Date: 202510192359
ISSUE DATE / TIME: 202509182104
Unit Type and Rh: 5100

## 2023-11-02 LAB — CBC
HCT: 26 % — ABNORMAL LOW (ref 39.0–52.0)
Hemoglobin: 8.8 g/dL — ABNORMAL LOW (ref 13.0–17.0)
MCH: 38.4 pg — ABNORMAL HIGH (ref 26.0–34.0)
MCHC: 33.8 g/dL (ref 30.0–36.0)
MCV: 113.5 fL — ABNORMAL HIGH (ref 80.0–100.0)
Platelets: 75 K/uL — ABNORMAL LOW (ref 150–400)
RBC: 2.29 MIL/uL — ABNORMAL LOW (ref 4.22–5.81)
WBC: 5.2 K/uL (ref 4.0–10.5)
nRBC: 0.6 % — ABNORMAL HIGH (ref 0.0–0.2)

## 2023-11-02 NOTE — Discharge Summary (Signed)
 Physician Discharge Summary  [name] [name]. FMW:[id number] DOB: 03-13-1980 DOA: 11/01/2023  PCP: [name], NP  Admit date: 11/01/2023  Discharge date: 11/02/2023  Discharge Diagnoses:  Principal Problem:   Sickle cell anemia with pain [facility])   Discharge Condition: Stable  Disposition:  Pt is discharged home in good condition and is to follow up with [name], NP this week to have labs evaluated. [name] A Deihl Jr. is instructed to increase activity slowly and balance with rest for the next few days, and use prescribed medication to complete treatment of pain  Diet: Regular Wt Readings from Last 3 Encounters:  10/25/23 57 kg  10/03/23 57 kg  08/20/23 58.1 kg    History of present illness:  [name] Blakeman. is a 43 y.o. male with history of sickle cell disease, Chronic pain syndrome, opiate dependence and tolerance, anemia of chronic disease, and CKD stage IV who presented to the day hospital with major complaints of generalized body pain but primarily in his lower extremities and back, rates pain at 9/10. He describes this pain as constant and achy and throbbing.  Patient is reporting one fall in the last week, he fell on his right knee and today he is walking with a limp using a cane for ambulation. Patient is scheduled to see orthopedic in the next few days. Patient denies cough, shortness of breath, headache, dizziness, nausea, vomiting, diarrhea.  No urinary symptoms.   Day hospital course: Patient presented to the day hospital for worsening pain and generalize weakness.  Pain is unresolved after IV hydration and Dilaudid , labs returned with Hgb of 6.2g/dl. patient will be admitted for anemia, generalized weakness in the setting of sickle cell disease pain.   Hospital Course:  Patient was admitted for sickle cell pain pain, generalized weakness and anemia and managed appropriately with IVF, IV Dilaudid  via PCA as well as other adjunct therapies per  sickle cell pain management protocols.  Patient received 1 unit of packed red blood cells for low hemoglobin of 6.2.  Patient tolerated transfusion without reaction.  Hemoglobin improved to 8.8 g/dl.  Patient is reportings that generalized weakness is resolved.  He is able to ambulate with his walker without falling.  Reports that pain is at baseline.  He has no new concerns.  Patient asked to be discharged. He was therefore discharged home today in a hemodynamically stable condition.   [name] [name] will follow-up with PCP within 1 week of this discharge. [name] [name] was counseled extensively about nonpharmacologic means of pain management, patient verbalized understanding and was appreciative of  the care received during this admission.   We discussed the need for good hydration, monitoring of hydration status, avoidance of heat, cold, stress, and infection triggers. We discussed the need to be adherent with taking Hydrea  and other home medications. Patient was reminded of the need to seek medical attention immediately if any symptom of bleeding, anemia, or infection occurs.  Discharge Exam: Vitals:   11/02/23 0801 11/02/23 0838  BP:  (!) 132/94  Pulse:  83  Resp: 16 19  Temp:  98.2 F (36.8 C)  SpO2:  99%   Vitals:   11/02/23 0442 11/02/23 0447 11/02/23 0801 11/02/23 0838  BP:  (!) 127/95  (!) 132/94  Pulse:  86  83  Resp: 16 16 16 19   Temp:  98.2 F (36.8 C)  98.2 F (36.8 C)  TempSrc:  Oral  Oral  SpO2:  94%  99%    General  appearance : Awake, alert, not in any distress. Speech Clear. Not toxic looking HEENT: Atraumatic and Normocephalic, pupils equally reactive to light and accomodation Neck: Supple, no JVD. No cervical lymphadenopathy.  Chest: Good air entry bilaterally, no added sounds  CVS: S1 S2 regular, no murmurs.  Abdomen: Bowel sounds present, Non tender and not distended with no gaurding, rigidity or rebound. Extremities: B/L Lower Ext shows no edema, both legs are warm to  touch Neurology: Awake alert, and oriented X 3, CN II-XII intact, Non focal Skin: No Rash  Discharge Instructions  Discharge Instructions     Diet - low sodium heart healthy   Complete by: As directed    Increase activity slowly   Complete by: As directed       Allergies as of 11/02/2023       Reactions   Nsaids Other (See Comments)   Kidney disease   Morphine  Other (See Comments)   -Really bad headaches        Medication List     TAKE these medications    amitriptyline  50 MG tablet Commonly known as: ELAVIL  Take 100 mg by mouth at bedtime.   calcitRIOL  0.25 MCG capsule Commonly known as: ROCALTROL  Take 0.25 mcg by mouth daily with breakfast.   Ferriprox  Twice-A-Day 1000 MG Tabs Generic drug: Deferiprone  (Twice Daily) Take 3,000 mg by mouth daily.   folic acid  1 MG tablet Commonly known as: FOLVITE  TAKE 1 TABLET BY MOUTH EVERY DAY   hydroxyurea  500 MG capsule Commonly known as: HYDREA  Take 1 capsule by mouth in the morning on Sun/Tues/Wed/Thurs/Sat and 2 capsules on Mon/Fri   Oxycodone  HCl 10 MG Tabs Take 1 tablet (10 mg total) by mouth See admin instructions for 15 days. Take 10 mg by mouth every 6 hours   oxyCODONE  15 mg 12 hr tablet Commonly known as: OXYCONTIN  Take 1 tablet (15 mg) by mouth every 12 hours.   sodium bicarbonate  650 MG tablet Take 1,950 mg by mouth 3 (three) times daily.   sodium zirconium cyclosilicate  10 g Pack packet Commonly known as: LOKELMA  Take 10 g by mouth daily.   Tylenol  8 Hour Arthritis Pain 650 MG CR tablet Generic drug: acetaminophen  Take 1,300 mg by mouth as needed for pain.        The results of significant diagnostics from this hospitalization (including imaging, microbiology, ancillary and laboratory) are listed below for reference.    Significant Diagnostic Studies: No results found.  Microbiology: No results found for this or any previous visit (from the past 240 hours).   Labs: Basic Metabolic  Panel: Recent Labs  Lab 11/01/23 1844 11/02/23 0527  NA 137 136  K 4.8 4.8  CL 103 103  CO2 18* 20*  GLUCOSE 107* 107*  BUN 36* 37*  CREATININE 3.08* 2.45*  CALCIUM  9.3 9.3   Liver Function Tests: Recent Labs  Lab 11/01/23 1844  AST 103*  ALT 110*  ALKPHOS 270*  BILITOT 0.8  PROT 7.6  ALBUMIN  4.0   No results for input(s): LIPASE, AMYLASE in the last 168 hours. No results for input(s): AMMONIA in the last 168 hours. CBC: Recent Labs  Lab 11/01/23 1011 11/02/23 0527  WBC 5.4 5.2  NEUTROABS 1.8  --   HGB 6.2* 8.8*  HCT 17.9* 26.0*  MCV 134.6* 113.5*  PLT 83* 75*   Cardiac Enzymes: No results for input(s): CKTOTAL, CKMB, CKMBINDEX, TROPONINI in the last 168 hours. BNP: Invalid input(s): POCBNP CBG: No results for input(s): GLUCAP in the  last 168 hours.  Time coordinating discharge: 50 minutes  Signed:  Homer CHRISTELLA Cover NP  [facility] 11/02/2023, 9:39 AM

## 2023-11-02 NOTE — Care Management Obs Status (Signed)
 MEDICARE OBSERVATION STATUS NOTIFICATION   Patient Details  Name: [name] [name]. MRN: [id number] Date of Birth: 1980/07/12   Medicare Observation Status Notification Given:  Yes    NORMAN ASPEN, LCSW 11/02/2023, 10:06 AM

## 2023-11-02 NOTE — Plan of Care (Signed)
  Problem: Education: Goal: Knowledge of vaso-occlusive preventative measures will improve Outcome: Adequate for Discharge Goal: Awareness of infection prevention will improve Outcome: Adequate for Discharge Goal: Awareness of signs and symptoms of anemia will improve Outcome: Adequate for Discharge Goal: Long-term complications will improve Outcome: Adequate for Discharge   Problem: Self-Care: Goal: Ability to incorporate actions that prevent/reduce pain crisis will improve Outcome: Adequate for Discharge   Problem: Bowel/Gastric: Goal: Gut motility will be maintained Outcome: Adequate for Discharge   Problem: Tissue Perfusion: Goal: Complications related to inadequate tissue perfusion will be avoided or minimized Outcome: Adequate for Discharge   Problem: Respiratory: Goal: Pulmonary complications will be avoided or minimized Outcome: Adequate for Discharge Goal: Acute Chest Syndrome will be identified early to prevent complications Outcome: Adequate for Discharge   Problem: Fluid Volume: Goal: Ability to maintain a balanced intake and output will improve Outcome: Adequate for Discharge   Problem: Sensory: Goal: Pain level will decrease with appropriate interventions Outcome: Adequate for Discharge   Problem: Health Behavior: Goal: Postive changes in compliance with treatment and prescription regimens will improve Outcome: Adequate for Discharge   Problem: Education: Goal: Knowledge of General Education information will improve Description: Including pain rating scale, medication(s)/side effects and non-pharmacologic comfort measures Outcome: Adequate for Discharge   Problem: Health Behavior/Discharge Planning: Goal: Ability to manage health-related needs will improve Outcome: Adequate for Discharge   Problem: Clinical Measurements: Goal: Ability to maintain clinical measurements within normal limits will improve Outcome: Adequate for Discharge Goal: Will remain  free from infection Outcome: Adequate for Discharge Goal: Diagnostic test results will improve Outcome: Adequate for Discharge Goal: Respiratory complications will improve Outcome: Adequate for Discharge Goal: Cardiovascular complication will be avoided Outcome: Adequate for Discharge   Problem: Activity: Goal: Risk for activity intolerance will decrease Outcome: Adequate for Discharge   Problem: Nutrition: Goal: Adequate nutrition will be maintained Outcome: Adequate for Discharge   Problem: Coping: Goal: Level of anxiety will decrease Outcome: Adequate for Discharge   Problem: Elimination: Goal: Will not experience complications related to bowel motility Outcome: Adequate for Discharge Goal: Will not experience complications related to urinary retention Outcome: Adequate for Discharge   Problem: Pain Managment: Goal: General experience of comfort will improve and/or be controlled Outcome: Adequate for Discharge   Problem: Safety: Goal: Ability to remain free from injury will improve Outcome: Adequate for Discharge   Problem: Skin Integrity: Goal: Risk for impaired skin integrity will decrease Outcome: Adequate for Discharge

## 2023-11-02 NOTE — Care Management CC44 (Signed)
 Condition Code 44 Documentation Completed  Patient Details  Name: [name] [name]. MRN: [id number] Date of Birth: July 28, 1980   Condition Code 44 given:  Yes Patient signature on Condition Code 44 notice:  Yes Documentation of 2 MD's agreement:  Yes Code 44 added to claim:  Yes    Kacyn Souder, LCSW 11/02/2023, 10:06 AM

## 2023-11-05 ENCOUNTER — Other Ambulatory Visit: Payer: Self-pay | Admitting: Nurse Practitioner

## 2023-11-05 DIAGNOSIS — D5700 Hb-SS disease with crisis, unspecified: Secondary | ICD-10-CM

## 2023-11-06 ENCOUNTER — Other Ambulatory Visit: Payer: Self-pay

## 2023-11-06 DIAGNOSIS — D5700 Hb-SS disease with crisis, unspecified: Secondary | ICD-10-CM

## 2023-11-07 ENCOUNTER — Ambulatory Visit: Payer: Self-pay | Admitting: Nurse Practitioner

## 2023-11-07 DIAGNOSIS — N289 Disorder of kidney and ureter, unspecified: Secondary | ICD-10-CM

## 2023-11-07 LAB — COMPREHENSIVE METABOLIC PANEL WITH GFR
ALT: 42 IU/L (ref 0–44)
AST: 46 IU/L — ABNORMAL HIGH (ref 0–40)
Albumin: 3.9 g/dL — ABNORMAL LOW (ref 4.1–5.1)
Alkaline Phosphatase: 240 IU/L — ABNORMAL HIGH (ref 47–123)
BUN/Creatinine Ratio: 14 (ref 9–20)
BUN: 41 mg/dL — ABNORMAL HIGH (ref 6–24)
Bilirubin Total: 0.5 mg/dL (ref 0.0–1.2)
CO2: 18 mmol/L — ABNORMAL LOW (ref 20–29)
Calcium: 8.6 mg/dL — ABNORMAL LOW (ref 8.7–10.2)
Chloride: 105 mmol/L (ref 96–106)
Creatinine, Ser: 3.01 mg/dL — ABNORMAL HIGH (ref 0.76–1.27)
Globulin, Total: 2.9 g/dL (ref 1.5–4.5)
Glucose: 89 mg/dL (ref 70–99)
Potassium: 5.3 mmol/L — ABNORMAL HIGH (ref 3.5–5.2)
Sodium: 138 mmol/L (ref 134–144)
Total Protein: 6.8 g/dL (ref 6.0–8.5)
eGFR: 26 mL/min/1.73 — ABNORMAL LOW (ref >59)

## 2023-11-07 LAB — CBC
Hematocrit: 25.6 % — ABNORMAL LOW (ref 37.5–51.0)
Hemoglobin: 8.7 g/dL — ABNORMAL LOW (ref 13.0–17.7)
MCH: 40.3 pg — ABNORMAL HIGH (ref 26.6–33.0)
MCHC: 34 g/dL (ref 31.5–35.7)
MCV: 119 fL — ABNORMAL HIGH (ref 79–97)
NRBC: 1 % — ABNORMAL HIGH (ref 0–0)
Platelets: 130 x10E3/uL — ABNORMAL LOW (ref 150–450)
RBC: 2.16 x10E6/uL — CL (ref 4.14–5.80)
WBC: 4.2 x10E3/uL (ref 3.4–10.8)

## 2023-11-13 ENCOUNTER — Telehealth: Payer: Self-pay

## 2023-11-13 ENCOUNTER — Other Ambulatory Visit: Payer: Self-pay

## 2023-11-13 ENCOUNTER — Other Ambulatory Visit: Payer: Self-pay | Admitting: Internal Medicine

## 2023-11-13 DIAGNOSIS — G894 Chronic pain syndrome: Secondary | ICD-10-CM

## 2023-11-13 DIAGNOSIS — D571 Sickle-cell disease without crisis: Secondary | ICD-10-CM

## 2023-11-13 MED ORDER — OXYCODONE HCL 10 MG PO TABS: 10.0000 mg | ORAL_TABLET | ORAL | 0 refills | Status: DC

## 2023-11-13 NOTE — Telephone Encounter (Signed)
 Per chart: oxyCODONE  (OXYCONTIN ) 15 mg 12 hr tablet

## 2023-11-13 NOTE — Telephone Encounter (Signed)
 Copied from CRM [id number]. Topic: Clinical - Prescription Issue >> Nov 13, 2023  8:16 AM Deaijah H wrote: Reason for CRM: Patient called in to go ahead and have oxyCODONE  (OXYCONTIN ) 15 mg 12 hr tablet refilled but kept saying OxyContin  10 mg did advise I only show 15mg  he stated he takes OxyContin  and oxycodone , I informed him OxyContin  is the generic name, but kept saying it's suppose to be 10mg . Did not complete refill request due to his information that was provided and was is being shown in chart. Please call to correct or confirm.   ----------------------------------------------------------------------- From previous Reason for Contact - Medication Refill: Medication: oxyCODONE  (OXYCONTIN ) 15 mg 12 hr tablet  Has the patient contacted their pharmacy? No (Agent: If no, request that the patient contact the pharmacy for the refill. If patient does not wish to contact the pharmacy document the reason why and proceed with request.) Unallowed due to controlled substance. Want to speak to Dr. (Agent: If yes, when and what did the pharmacy advise?)  This is the patient's preferred pharmacy:  [facility] [address] [place] - [address] AT [address] [place] [address] Phone: [phone] Fax: [phone]  Is this the correct pharmacy for this prescription? Yes If no, delete pharmacy and type the correct one.   Has the prescription been filled recently? Yes  Is the patient out of the medication? Yes  Has the patient been seen for an appointment in the last year OR does the patient have an upcoming appointment?    Can we respond through MyChart?    Agent: Please be advised that Rx refills may take up to 3 business days. We ask that you follow-up with your pharmacy.

## 2023-11-13 NOTE — Telephone Encounter (Signed)
 Please advise if you will dill pt oxycodone  10 mg . Pt advise he is in pain and has appt 11/20/23 with Tonya.

## 2023-11-14 NOTE — Telephone Encounter (Signed)
 Dr. Morrell sent in yesterday. KH

## 2023-11-16 ENCOUNTER — Emergency Department (HOSPITAL_COMMUNITY)

## 2023-11-16 ENCOUNTER — Encounter (HOSPITAL_COMMUNITY): Payer: Self-pay

## 2023-11-16 ENCOUNTER — Inpatient Hospital Stay (HOSPITAL_COMMUNITY)
Admission: EM | Admit: 2023-11-16 | Discharge: 2023-11-18 | DRG: 812 | Disposition: A | Attending: Internal Medicine | Admitting: Internal Medicine

## 2023-11-16 DIAGNOSIS — Z9081 Acquired absence of spleen: Secondary | ICD-10-CM

## 2023-11-16 DIAGNOSIS — N08 Glomerular disorders in diseases classified elsewhere: Secondary | ICD-10-CM | POA: Diagnosis present

## 2023-11-16 DIAGNOSIS — F419 Anxiety disorder, unspecified: Secondary | ICD-10-CM | POA: Diagnosis present

## 2023-11-16 DIAGNOSIS — Z886 Allergy status to analgesic agent status: Secondary | ICD-10-CM

## 2023-11-16 DIAGNOSIS — E875 Hyperkalemia: Secondary | ICD-10-CM | POA: Diagnosis present

## 2023-11-16 DIAGNOSIS — Z79899 Other long term (current) drug therapy: Secondary | ICD-10-CM | POA: Diagnosis not present

## 2023-11-16 DIAGNOSIS — Z79891 Long term (current) use of opiate analgesic: Secondary | ICD-10-CM

## 2023-11-16 DIAGNOSIS — F32A Depression, unspecified: Secondary | ICD-10-CM | POA: Diagnosis present

## 2023-11-16 DIAGNOSIS — Z716 Tobacco abuse counseling: Secondary | ICD-10-CM

## 2023-11-16 DIAGNOSIS — Z9049 Acquired absence of other specified parts of digestive tract: Secondary | ICD-10-CM

## 2023-11-16 DIAGNOSIS — D5700 Hb-SS disease with crisis, unspecified: Principal | ICD-10-CM | POA: Diagnosis present

## 2023-11-16 DIAGNOSIS — D571 Sickle-cell disease without crisis: Secondary | ICD-10-CM

## 2023-11-16 DIAGNOSIS — F17210 Nicotine dependence, cigarettes, uncomplicated: Secondary | ICD-10-CM | POA: Diagnosis present

## 2023-11-16 DIAGNOSIS — R7401 Elevation of levels of liver transaminase levels: Secondary | ICD-10-CM | POA: Diagnosis present

## 2023-11-16 DIAGNOSIS — Z885 Allergy status to narcotic agent status: Secondary | ICD-10-CM

## 2023-11-16 DIAGNOSIS — N184 Chronic kidney disease, stage 4 (severe): Secondary | ICD-10-CM | POA: Diagnosis present

## 2023-11-16 DIAGNOSIS — G894 Chronic pain syndrome: Secondary | ICD-10-CM | POA: Diagnosis present

## 2023-11-16 DIAGNOSIS — D631 Anemia in chronic kidney disease: Secondary | ICD-10-CM | POA: Diagnosis present

## 2023-11-16 LAB — CBC WITH DIFFERENTIAL/PLATELET
Abs Immature Granulocytes: 0.02 K/uL (ref 0.00–0.07)
Basophils Absolute: 0 K/uL (ref 0.0–0.1)
Basophils Relative: 0 %
Eosinophils Absolute: 0 K/uL (ref 0.0–0.5)
Eosinophils Relative: 0 %
HCT: 24.1 % — ABNORMAL LOW (ref 39.0–52.0)
Hemoglobin: 8 g/dL — ABNORMAL LOW (ref 13.0–17.0)
Immature Granulocytes: 0 %
Lymphocytes Relative: 62 %
Lymphs Abs: 3.1 K/uL (ref 0.7–4.0)
MCH: 39 pg — ABNORMAL HIGH (ref 26.0–34.0)
MCHC: 33.2 g/dL (ref 30.0–36.0)
MCV: 117.6 fL — ABNORMAL HIGH (ref 80.0–100.0)
Monocytes Absolute: 0.2 K/uL (ref 0.1–1.0)
Monocytes Relative: 4 %
Neutro Abs: 1.7 K/uL (ref 1.7–7.7)
Neutrophils Relative %: 34 %
Platelets: 119 K/uL — ABNORMAL LOW (ref 150–400)
RBC: 2.05 MIL/uL — ABNORMAL LOW (ref 4.22–5.81)
WBC: 5 K/uL (ref 4.0–10.5)
nRBC: 0.4 % — ABNORMAL HIGH (ref 0.0–0.2)

## 2023-11-16 LAB — COMPREHENSIVE METABOLIC PANEL WITH GFR
ALT: 74 U/L — ABNORMAL HIGH (ref 0–44)
AST: 61 U/L — ABNORMAL HIGH (ref 15–41)
Albumin: 4 g/dL (ref 3.5–5.0)
Alkaline Phosphatase: 249 U/L — ABNORMAL HIGH (ref 38–126)
Anion gap: 12 (ref 5–15)
BUN: 47 mg/dL — ABNORMAL HIGH (ref 6–20)
CO2: 20 mmol/L — ABNORMAL LOW (ref 22–32)
Calcium: 9 mg/dL (ref 8.9–10.3)
Chloride: 107 mmol/L (ref 98–111)
Creatinine, Ser: 3.17 mg/dL — ABNORMAL HIGH (ref 0.61–1.24)
GFR, Estimated: 24 mL/min — ABNORMAL LOW (ref >60)
Glucose, Bld: 91 mg/dL (ref 70–99)
Potassium: 5.5 mmol/L — ABNORMAL HIGH (ref 3.5–5.1)
Sodium: 138 mmol/L (ref 135–145)
Total Bilirubin: 0.9 mg/dL (ref 0.0–1.2)
Total Protein: 7.3 g/dL (ref 6.5–8.1)

## 2023-11-16 LAB — RETICULOCYTES
Immature Retic Fract: 8.9 % (ref 2.3–15.9)
RBC.: 2.05 MIL/uL — ABNORMAL LOW (ref 4.22–5.81)
Retic Count, Absolute: 42.2 K/uL (ref 19.0–186.0)
Retic Ct Pct: 2.1 % (ref 0.4–3.1)

## 2023-11-16 MED ORDER — HYDROMORPHONE HCL 1 MG/ML IJ SOLN: 1.0000 mg | INTRAMUSCULAR | Status: AC

## 2023-11-16 MED ORDER — HYDROMORPHONE HCL 1 MG/ML IJ SOLN
0.5000 mg | Freq: Once | INTRAMUSCULAR | Status: AC
  Administered 2023-11-16: 0.5 mg via INTRAVENOUS
  Filled 2023-11-16: qty 1

## 2023-11-16 MED ORDER — NALOXONE HCL 0.4 MG/ML IJ SOLN: 0.4000 mg | INTRAMUSCULAR | Status: DC | PRN

## 2023-11-16 MED ORDER — SODIUM CHLORIDE 0.45 % IV SOLN: INTRAVENOUS | Status: AC

## 2023-11-16 MED ORDER — SENNOSIDES-DOCUSATE SODIUM 8.6-50 MG PO TABS
1.0000 | ORAL_TABLET | Freq: Two times a day (BID) | ORAL | Status: DC
  Filled 2023-11-16 (×3): qty 1

## 2023-11-16 MED ORDER — SODIUM BICARBONATE 650 MG PO TABS
1950.0000 mg | ORAL_TABLET | Freq: Three times a day (TID) | ORAL | Status: DC
Start: 2023-11-16 — End: 2023-11-18
  Administered 2023-11-16 – 2023-11-18 (×5): 1950 mg via ORAL
  Filled 2023-11-16 (×6): qty 3

## 2023-11-16 MED ORDER — POLYETHYLENE GLYCOL 3350 17 G PO PACK: 17.0000 g | PACK | Freq: Every day | ORAL | Status: DC | PRN

## 2023-11-16 MED ORDER — ENOXAPARIN SODIUM 40 MG/0.4ML IJ SOSY
40.0000 mg | PREFILLED_SYRINGE | INTRAMUSCULAR | Status: DC
  Administered 2023-11-16 – 2023-11-17 (×2): 40 mg via SUBCUTANEOUS
  Filled 2023-11-16 (×2): qty 0.4

## 2023-11-16 MED ORDER — DIPHENHYDRAMINE HCL 25 MG PO CAPS: 25.0000 mg | ORAL_CAPSULE | ORAL | Status: DC | PRN

## 2023-11-16 MED ORDER — OXYCODONE HCL 10 MG PO TABS: 10.0000 mg | ORAL_TABLET | ORAL | Status: DC

## 2023-11-16 MED ORDER — OXYCODONE HCL ER 15 MG PO T12A
15.0000 mg | EXTENDED_RELEASE_TABLET | Freq: Two times a day (BID) | ORAL | Status: DC
Start: 2023-11-16 — End: 2023-11-18
  Administered 2023-11-16 – 2023-11-18 (×4): 15 mg via ORAL
  Filled 2023-11-16 (×4): qty 1

## 2023-11-16 MED ORDER — HYDROMORPHONE HCL 1 MG/ML IJ SOLN
2.0000 mg | INTRAMUSCULAR | Status: DC
  Filled 2023-11-16: qty 2

## 2023-11-16 MED ORDER — OXYCODONE HCL 5 MG PO TABS
10.0000 mg | ORAL_TABLET | Freq: Four times a day (QID) | ORAL | Status: DC | PRN
  Administered 2023-11-16: 10 mg via ORAL
  Filled 2023-11-16: qty 2

## 2023-11-16 MED ORDER — SODIUM ZIRCONIUM CYCLOSILICATE 10 G PO PACK
10.0000 g | PACK | Freq: Once | ORAL | Status: AC
  Administered 2023-11-16: 10 g via ORAL
  Filled 2023-11-16: qty 1

## 2023-11-16 MED ORDER — HYDROMORPHONE HCL 1 MG/ML IJ SOLN
INTRAMUSCULAR | Status: AC
  Administered 2023-11-16: 1 mg
  Filled 2023-11-16: qty 1

## 2023-11-16 MED ORDER — HYDROMORPHONE 1 MG/ML IV SOLN
INTRAVENOUS | Status: DC
  Administered 2023-11-16: 30 mg via INTRAVENOUS
  Administered 2023-11-17: 3 mg via INTRAVENOUS
  Administered 2023-11-17: 1.8 mg via INTRAVENOUS
  Administered 2023-11-17: 0.1 mg via INTRAVENOUS
  Administered 2023-11-17: 2.1 mg via INTRAVENOUS
  Administered 2023-11-17: 0.6 mg via INTRAVENOUS
  Administered 2023-11-17: 2.4 mg via INTRAVENOUS
  Administered 2023-11-18: 0.9 mg via INTRAVENOUS
  Administered 2023-11-18: 3.6 mg via INTRAVENOUS
  Administered 2023-11-18: 3 mg via INTRAVENOUS
  Administered 2023-11-18: 0.9 mg via INTRAVENOUS
  Filled 2023-11-16: qty 30

## 2023-11-16 MED ORDER — HYDROXYUREA 500 MG PO CAPS: 500.0000 mg | ORAL_CAPSULE | ORAL | Status: DC

## 2023-11-16 MED ORDER — AMITRIPTYLINE HCL 25 MG PO TABS
100.0000 mg | ORAL_TABLET | Freq: Every day | ORAL | Status: DC
Start: 2023-11-16 — End: 2023-11-18
  Administered 2023-11-16 – 2023-11-17 (×2): 100 mg via ORAL
  Filled 2023-11-16 (×2): qty 4

## 2023-11-16 MED ORDER — ENOXAPARIN SODIUM 40 MG/0.4ML IJ SOSY: 40.0000 mg | PREFILLED_SYRINGE | INTRAMUSCULAR | Status: DC

## 2023-11-16 MED ORDER — ONDANSETRON HCL 4 MG/2ML IJ SOLN: 4.0000 mg | Freq: Four times a day (QID) | INTRAMUSCULAR | Status: DC | PRN

## 2023-11-16 MED ORDER — CALCITRIOL 0.25 MCG PO CAPS
0.2500 ug | ORAL_CAPSULE | Freq: Every day | ORAL | Status: DC
  Administered 2023-11-17 – 2023-11-18 (×2): 0.25 ug via ORAL
  Filled 2023-11-16 (×2): qty 1

## 2023-11-16 MED ORDER — NALOXONE HCL 0.4 MG/ML IJ SOLN
0.2000 mg | Freq: Once | INTRAMUSCULAR | Status: AC
  Administered 2023-11-16: 0.2 mg via INTRAVENOUS
  Filled 2023-11-16: qty 1

## 2023-11-16 MED ORDER — SODIUM CHLORIDE 0.9% FLUSH: 9.0000 mL | INTRAVENOUS | Status: DC | PRN

## 2023-11-16 MED ORDER — HYDROMORPHONE HCL 1 MG/ML IJ SOLN: 2.0000 mg | INTRAMUSCULAR | Status: DC

## 2023-11-16 MED ORDER — FOLIC ACID 1 MG PO TABS
1.0000 mg | ORAL_TABLET | Freq: Every day | ORAL | Status: DC
Start: 2023-11-17 — End: 2023-11-18
  Administered 2023-11-17 – 2023-11-18 (×2): 1 mg via ORAL
  Filled 2023-11-16 (×2): qty 1

## 2023-11-16 MED ORDER — DEFERIPRONE (TWICE DAILY) 1000 MG PO TABS: 3000.0000 mg | ORAL_TABLET | Freq: Every day | ORAL | Status: DC

## 2023-11-16 MED ORDER — SODIUM ZIRCONIUM CYCLOSILICATE 10 G PO PACK
10.0000 g | PACK | Freq: Every day | ORAL | Status: DC
Start: 2023-11-17 — End: 2023-11-18
  Administered 2023-11-17 – 2023-11-18 (×2): 10 g via ORAL
  Filled 2023-11-16 (×2): qty 1

## 2023-11-16 MED ORDER — HYDROMORPHONE HCL 1 MG/ML IJ SOLN
2.0000 mg | INTRAMUSCULAR | Status: AC
  Administered 2023-11-16: 2 mg via INTRAVENOUS
  Filled 2023-11-16: qty 2

## 2023-11-16 NOTE — ED Notes (Addendum)
 Pt noted to be asleep and in no distress and w/o complaints. PA made aware of this and the decision to withhold narcotics at this time was discussed and agreed upon by this medic and the PA.

## 2023-11-16 NOTE — ED Notes (Signed)
 Following narcan  administration, pt was a/o, w/o complaints. Pt was ambulatory to the restroom.

## 2023-11-16 NOTE — H&P (Signed)
 H&P  Patient Demographics:  [name] [name], is a 43 y.o. male  MRN: [id number]   DOB - 11-11-1980  Admit Date - 11/16/2023  Outpatient Primary MD for the patient is [name], NP  Chief Complaint  Patient presents with   Sickle Cell Pain Crisis      HPI:   [name] [name]  is a 43 y.o. male  with history of sickle cell disease, Chronic pain syndrome, opiate dependence and tolerance, anemia of chronic disease, and CKD stage IV who presented to the ED with major complaints of generalized body pain but primarily in his lower extremities and back, rates pain at 9/10. He describes this pain as constant and achy and throbbing, Patient denies cough, shortness of breath, headache, dizziness, nausea, vomiting, diarrhea.  No urinary symptoms.  ED Course: BP 101/73  Pulse 85  Temp (!) 96.6 F (35.9 C) (Axillary)  Resp 11  SpO2 100%  Potassium 5.5, creatinine 3.1, hemoglobin 8.0, platelet 119, GFR 24 Patient presented to the emergency department with uncontrolled pain bilateral lower extremity.  He was treated with IV fluid, IV Dilaudid  with no resolution to symptoms.  Patient will be admitted for ongoing sickle cell pain management.  PCA set to lowest dose,in report patient needed Narcan  in the emergency room.  UDS pending.    Review of systems:  In addition to the HPI above, patient reports No fever or chills No Headache, No changes with vision or hearing No problems swallowing food or liquids No chest pain, cough or shortness of breath No abdominal pain, No nausea or vomiting, Bowel movements are regular No blood in stool or urine No dysuria No new skin rashes or bruises No new joints pains-aches No new weakness, tingling, numbness in any extremity No recent weight gain or loss No polyuria, polydypsia or polyphagia No significant Mental Stressors  A full 10 point Review of Systems was done, except as stated above, all other Review of Systems were negative.  With Past  History of the following :   Past Medical History:  Diagnosis Date   Abscess of right iliac fossa 09/24/2018   required Perc Drain 09/24/2018   Arachnoid Cyst of brain bilaterally    2 really small ones in the back of my head; inside; saw them w/MRI (09/25/2012)   Bacterial pneumonia ~ 2012   caught it here in the hospital (09/25/2012)   Chronic kidney disease    from my sickle cell (09/25/2012)   CKD (chronic kidney disease), stage II    Colitis 04/19/2017   CT scan abd/ pelvis   GERD (gastroesophageal reflux disease)    after I eat alot of spicey foods (09/25/2012)   Gynecomastia, male 07/10/2012   History of blood transfusion    always related to sickle cell crisis (09/25/2012)   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   Noted in noted from Hematology notes at [facility]   Migraines    take RX qd to prevent them (09/25/2012)   Opioid dependence with withdrawal (HCC) 08/30/2012   Renal insufficiency    Sickle cell anemia (HCC)    Sickle cell crisis (HCC) 09/25/2012   Sickle cell nephropathy (HCC) 07/10/2012   Sinus tachycardia    Tachycardia with heart rate 121-140 beats per minute with ambulation 08/04/2016      Past Surgical History:  Procedure Laterality Date   ABCESS DRAINAGE     CHOLECYSTECTOMY  ~ 2012   COLONOSCOPY N/A 04/23/2017   Procedure: COLONOSCOPY;  Surgeon: [name], MD;  Location: [facility];  Service: Endoscopy;  Laterality: N/A;   IR FLUORO GUIDE CV LINE RIGHT  12/17/2016   IR FLUORO GUIDE CV LINE RIGHT  02/07/2021   IR RADIOLOGIST EVAL & MGMT  10/02/2018   IR RADIOLOGIST EVAL & MGMT  10/15/2018   IR REMOVAL TUN CV CATH W/O FL  12/21/2016   IR REMOVAL TUN CV CATH W/O FL  02/11/2021   IR US  GUIDE VASC ACCESS RIGHT  12/17/2016   IR US  GUIDE VASC ACCESS RIGHT  02/07/2021   spleenectomy       Social History:   Social History   Tobacco Use   Smoking status: Former    Current packs/day: 0.10    Types: Cigarettes   Smokeless tobacco:  Never  Substance Use Topics   Alcohol  use: Yes    Comment: Once a month     Lives - At home   Family History :   Family History  Problem Relation Age of Onset   Breast cancer Mother      Home Medications:   Prior to Admission medications   Medication Sig Start Date End Date Taking? Authorizing Provider  amitriptyline  (ELAVIL ) 50 MG tablet Take 100 mg by mouth at bedtime. 09/20/23  Yes [provider]  calcitRIOL  (ROCALTROL ) 0.25 MCG capsule Take 0.25 mcg by mouth daily with breakfast. 11/16/20  Yes [provider]  Deferiprone , Twice Daily, (FERRIPROX  TWICE-A-DAY) 1000 MG TABS Take 3,000 mg by mouth daily. 05/01/22  Yes Tilford Bertram HERO, FNP  folic acid  (FOLVITE ) 1 MG tablet TAKE 1 TABLET BY MOUTH EVERY DAY 03/22/20  Yes Hollis, Lachina M, FNP  hydroxyurea  (HYDREA ) 500 MG capsule Take 1 capsule by mouth in the morning on Sun/Tues/Wed/Thurs/Sat and 2 capsules on Mon/Fri 01/25/23  Yes Nichols, [name], NP  oxyCODONE  (OXYCONTIN ) 15 mg 12 hr tablet Take 1 tablet (15 mg) by mouth every 12 hours. 10/28/23 11/27/23 Yes [name], [name], MD  Oxycodone  HCl 10 MG TABS Take 1 tablet (10 mg total) by mouth See admin instructions for 15 days. Take 10 mg by mouth every 6 hours 11/13/23 11/28/23 Yes [name], [name], MD  sodium bicarbonate  650 MG tablet Take 1,950 mg by mouth 3 (three) times daily. 11/07/18  Yes [provider]  TYLENOL  8 HOUR ARTHRITIS PAIN 650 MG CR tablet Take 1,300 mg by mouth as needed for pain.   Yes [provider]  sodium zirconium cyclosilicate  (LOKELMA ) 10 g PACK packet Take 10 g by mouth daily. Patient not taking: Reported on 11/16/2023 04/21/23 04/22/24  [provider]     Allergies:   Allergies  Allergen Reactions   Nsaids Other (See Comments)    Kidney disease   Morphine  Other (See Comments)    -Really bad headaches     Physical Exam:   Vitals:   Vitals:   11/16/23 1130 11/16/23 1436  BP: 101/73 117/83  Pulse:  85 (!) 101  Resp: 11 18  Temp:  98 F (36.7 C)  SpO2: 100% 99%    Physical Exam: Constitutional: Patient appears well-developed and well-nourished. Not in obvious distress. HENT: Normocephalic, atraumatic, External right and left ear normal. Oropharynx is clear and moist.  Eyes: Conjunctivae and EOM are normal. PERRLA, no scleral icterus. Neck: Normal ROM. Neck supple. No JVD. No tracheal deviation. No thyromegaly. CVS: RRR, S1/S2 +, no murmurs, no gallops, no carotid bruit.  Pulmonary: Effort and breath sounds normal, no stridor, rhonchi, wheezes, rales.  Abdominal: Soft. BS +, no distension,  tenderness, rebound or guarding.  Musculoskeletal: Normal range of motion. No edema and no tenderness.  Lymphadenopathy: No lymphadenopathy noted, cervical, inguinal or axillary Neuro: Alert. Normal reflexes, muscle tone coordination. No cranial nerve deficit. Skin: Skin is warm and dry. No rash noted. Not diaphoretic. No erythema. No pallor. Psychiatric: Normal mood and affect. Behavior, judgment, thought content normal.   Data Review:   CBC Recent Labs  Lab 11/16/23 0758  WBC 5.0  HGB 8.0*  HCT 24.1*  PLT 119*  MCV 117.6*  MCH 39.0*  MCHC 33.2  RDW Not Measured  LYMPHSABS 3.1  MONOABS 0.2  EOSABS 0.0  BASOSABS 0.0   ------------------------------------------------------------------------------------------------------------------  Chemistries  Recent Labs  Lab 11/16/23 0758  NA 138  K 5.5*  CL 107  CO2 20*  GLUCOSE 91  BUN 47*  CREATININE 3.17*  CALCIUM  9.0  AST 61*  ALT 74*  ALKPHOS 249*  BILITOT 0.9   ------------------------------------------------------------------------------------------------------------------ CrCl cannot be calculated (Unknown ideal weight.). ------------------------------------------------------------------------------------------------------------------ No results for input(s): TSH, T4TOTAL, T3FREE, THYROIDAB in the last 72  hours.  Invalid input(s): FREET3  Coagulation profile No results for input(s): INR, PROTIME in the last 168 hours. ------------------------------------------------------------------------------------------------------------------- No results for input(s): DDIMER in the last 72 hours. -------------------------------------------------------------------------------------------------------------------  Cardiac Enzymes No results for input(s): CKMB, TROPONINI, MYOGLOBIN in the last 168 hours.  Invalid input(s): CK ------------------------------------------------------------------------------------------------------------------ No results found for: BNP  ---------------------------------------------------------------------------------------------------------------  Urinalysis    Component Value Date/Time   COLORURINE YELLOW 04/03/2022 1011   APPEARANCEUR CLEAR 04/03/2022 1011   LABSPEC 1.008 04/03/2022 1011   PHURINE 8.0 04/03/2022 1011   GLUCOSEU 50 (A) 04/03/2022 1011   HGBUR NEGATIVE 04/03/2022 1011   HGBUR small 03/10/2010 1445   BILIRUBINUR NEGATIVE 04/03/2022 1011   BILIRUBINUR negative 04/19/2021 1217   BILIRUBINUR neg 09/30/2020 1117   KETONESUR NEGATIVE 04/03/2022 1011   PROTEINUR NEGATIVE 04/03/2022 1011   UROBILINOGEN 0.2 04/19/2021 1217   UROBILINOGEN 0.2 05/11/2017 1335   NITRITE NEGATIVE 04/03/2022 1011   LEUKOCYTESUR NEGATIVE 04/03/2022 1011    ----------------------------------------------------------------------------------------------------------------   Imaging Results:    DG Chest Portable 1 View Result Date: 11/16/2023 EXAM: 1 VIEW(S) XRAY OF THE CHEST 11/16/2023 07:19:00 AM COMPARISON: 07/19/2023 CLINICAL HISTORY: tachycardia. Per chart - complaints of ssc pain and swelling to his legs FINDINGS: LUNGS AND PLEURA: Stable subsegmental scarring left lung base. No focal pulmonary opacity. No pulmonary edema. No pleural effusion. No  pneumothorax. HEART AND MEDIASTINUM: No acute abnormality of the cardiac and mediastinal silhouettes. BONES AND SOFT TISSUES: Sclerosis of right humeral head. No acute osseous abnormality. IMPRESSION: 1. No acute cardiopulmonary process. 2. Stable subsegmental scarring at the left lung base. Electronically signed by: Evalene Coho MD 11/16/2023 07:25 AM EDT RP Workstation: [id number]     Assessment & Plan:  Principal Problem:   Sickle cell anemia with pain (HCC) Active Problems:   Sickle cell pain crisis (HCC)   Hb Sickle Cell Disease with crisis: Admit patient, start IVF 0.45% Saline @ 30mls/hour, start weight based Dilaudid  PCA at low-dose: 0 point 3/10/1.5., Restart oral home pain medications, Monitor vitals very closely, Re-evaluate pain scale regularly, 2 L of Oxygen  by [place], Patient will be re-evaluated for pain in the context of function and relationship to baseline as care progresses. Anemia of Chronic Disease: Hemoglobin at baseline will continue to monitor daily CBC. Chronic pain Syndrome: Continue oral home pain medication.   DVT Prophylaxis: Subcut Lovenox    AM Labs Ordered, also please review Full Orders  Family Communication: Admission, patient's condition  and plan of care including tests being ordered have been discussed with the patient who indicate understanding and agree with the plan and Code Status.  Code Status: Full Code  Consults called: None    Admission status: Inpatient    Time spent in minutes : 50 minutes  Homer CHRISTELLA Cover NP 11/16/2023 at 2:49 PM

## 2023-11-16 NOTE — ED Notes (Addendum)
 Pt is aware of the need for a urine sample. Urinal at bedside. He had just  used the restroom just prior to the order being placed.

## 2023-11-16 NOTE — ED Notes (Signed)
 Delay explained to pt. Charge aware of the lengthy dirty status of room for hours. Pt requested pain meds if he was not going upstairs soon, he wanted some pain meds. Pharmacy contacted to release/verify meds.

## 2023-11-16 NOTE — ED Provider Notes (Signed)
 [place] EMERGENCY DEPARTMENT AT [facility] Provider Note   CSN: [id number] Arrival date & time: 11/16/23  9356     Patient presents with: Sickle Cell Pain Crisis   [name] [name]. is a 43 y.o. male with past medical history significant for sickle cell disease, chronic pain syndrome, opiate dependence and tolerance, anemia of chronic disease, and CKD stage IV who presents to the ED due to suspected sickle cell pain crisis.  Patient admits to pain in his bilateral lower extremities that started yesterday.  Called the sickle cell clinic which was closed yesterday.  Patient took Tylenol  and oxycodone  2 hours prior to arrival with no relief in pain.  Denies chest pain and shortness of breath.  No fever.  Denies nausea and vomiting.  Notes this is similar to his previous pain crises.   History obtained from patient and past medical records. No interpreter used during encounter.       Prior to Admission medications   Medication Sig Start Date End Date Taking? Authorizing Provider  amitriptyline  (ELAVIL ) 50 MG tablet Take 100 mg by mouth at bedtime. 09/20/23  Yes [provider]  calcitRIOL  (ROCALTROL ) 0.25 MCG capsule Take 0.25 mcg by mouth daily with breakfast. 11/16/20  Yes [provider]  Deferiprone , Twice Daily, (FERRIPROX  TWICE-A-DAY) 1000 MG TABS Take 3,000 mg by mouth daily. 05/01/22  Yes Tilford Bertram HERO, FNP  folic acid  (FOLVITE ) 1 MG tablet TAKE 1 TABLET BY MOUTH EVERY DAY 03/22/20  Yes Hollis, Lachina M, FNP  hydroxyurea  (HYDREA ) 500 MG capsule Take 1 capsule by mouth in the morning on Sun/Tues/Wed/Thurs/Sat and 2 capsules on Mon/Fri 01/25/23  Yes Nichols, [name], NP  oxyCODONE  (OXYCONTIN ) 15 mg 12 hr tablet Take 1 tablet (15 mg) by mouth every 12 hours. 10/28/23 11/27/23 Yes [name], [name], MD  Oxycodone  HCl 10 MG TABS Take 1 tablet (10 mg total) by mouth See admin instructions for 15 days. Take 10 mg by mouth every 6 hours 11/13/23 11/28/23 Yes  [name], [name], MD  sodium bicarbonate  650 MG tablet Take 1,950 mg by mouth 3 (three) times daily. 11/07/18  Yes [provider]  TYLENOL  8 HOUR ARTHRITIS PAIN 650 MG CR tablet Take 1,300 mg by mouth as needed for pain.   Yes [provider]  sodium zirconium cyclosilicate  (LOKELMA ) 10 g PACK packet Take 10 g by mouth daily. Patient not taking: Reported on 11/16/2023 04/21/23 04/22/24  [provider]    Allergies: Nsaids and Morphine     Review of Systems  Constitutional:  Negative for fever.  Respiratory:  Negative for shortness of breath.   Cardiovascular:  Negative for chest pain.  Gastrointestinal:  Negative for abdominal pain.  Musculoskeletal:  Positive for arthralgias.    Updated Vital Signs BP 101/73   Pulse 85   Temp (!) 96.6 F (35.9 C) (Axillary)   Resp 11   SpO2 100%   Physical Exam Vitals and nursing note reviewed.  Constitutional:      General: He is not in acute distress.    Appearance: He is not ill-appearing.  HENT:     Head: Normocephalic.  Eyes:     Pupils: Pupils are equal, round, and reactive to light.  Cardiovascular:     Rate and Rhythm: Normal rate and regular rhythm.     Pulses: Normal pulses.     Heart sounds: Normal heart sounds. No murmur heard.    No friction rub. No gallop.  Pulmonary:  Effort: Pulmonary effort is normal.     Breath sounds: Normal breath sounds.  Abdominal:     General: Abdomen is flat. There is no distension.     Palpations: Abdomen is soft.     Tenderness: There is no abdominal tenderness. There is no guarding or rebound.     Comments: Abdomen soft, nondistended, nontender to palpation in all quadrants without guarding or peritoneal signs. No rebound.   Musculoskeletal:        General: Normal range of motion.     Cervical back: Neck supple.     Comments: No lower extremity edema. Moves all 4 extremities without difficulty  Skin:    General: Skin is warm and dry.  Neurological:      General: No focal deficit present.     Mental Status: He is alert.  Psychiatric:        Mood and Affect: Mood normal.        Behavior: Behavior normal.     (all labs ordered are listed, but only abnormal results are displayed) Labs Reviewed  COMPREHENSIVE METABOLIC PANEL WITH GFR - Abnormal; Notable for the following components:      Result Value   Potassium 5.5 (*)    CO2 20 (*)    BUN 47 (*)    Creatinine, Ser 3.17 (*)    AST 61 (*)    ALT 74 (*)    Alkaline Phosphatase 249 (*)    GFR, Estimated 24 (*)    All other components within normal limits  CBC WITH DIFFERENTIAL/PLATELET - Abnormal; Notable for the following components:   RBC 2.05 (*)    Hemoglobin 8.0 (*)    HCT 24.1 (*)    MCV 117.6 (*)    MCH 39.0 (*)    Platelets 119 (*)    nRBC 0.4 (*)    All other components within normal limits  RETICULOCYTES - Abnormal; Notable for the following components:   RBC. 2.05 (*)    All other components within normal limits  URINE DRUG SCREEN  ETHANOL  POCT URINE DRUG SCREEN - MANUAL ENTRY (I-SCREEN)    EKG: None  Radiology: DG Chest Portable 1 View Result Date: 11/16/2023 EXAM: 1 VIEW(S) XRAY OF THE CHEST 11/16/2023 07:19:00 AM COMPARISON: 07/19/2023 CLINICAL HISTORY: tachycardia. Per chart - complaints of ssc pain and swelling to his legs FINDINGS: LUNGS AND PLEURA: Stable subsegmental scarring left lung base. No focal pulmonary opacity. No pulmonary edema. No pleural effusion. No pneumothorax. HEART AND MEDIASTINUM: No acute abnormality of the cardiac and mediastinal silhouettes. BONES AND SOFT TISSUES: Sclerosis of right humeral head. No acute osseous abnormality. IMPRESSION: 1. No acute cardiopulmonary process. 2. Stable subsegmental scarring at the left lung base. Electronically signed by: Evalene Coho MD 11/16/2023 07:25 AM EDT RP Workstation: [id number]     Procedures   Medications Ordered in the ED  HYDROmorphone  (DILAUDID ) injection 2 mg (0 mg Intravenous Hold  11/16/23 0911)  HYDROmorphone  (DILAUDID ) injection 2 mg (0 mg Intravenous Hold 11/16/23 0956)  HYDROmorphone  (DILAUDID ) injection 2 mg (2 mg Intravenous Given 11/16/23 0811)  sodium zirconium cyclosilicate  (LOKELMA ) packet 10 g (10 g Oral Given 11/16/23 1321)  naloxone  (NARCAN ) injection 0.2 mg (0.2 mg Intravenous Given 11/16/23 1242)  HYDROmorphone  (DILAUDID ) injection 0.5 mg (0.5 mg Intravenous Given 11/16/23 1326)    Clinical Course as of 11/16/23 1435  Fri Nov 16, 2023  1044 Reassessed patient at bedside.  Patient falling asleep while asking questions.  Will hold on second dose of  Dilaudid .  O2 saturation 100% on room air [CA]  1216 Reassessed patient at bedside. Still drowsy. Will give some narcan .  [CA]  1323 Reassessed patient after Narcan .  Patient awake.  Notes he always gets sleepy after Dilaudid .  Reviewed chart and it appears patient is always given 2 mg of Dilaudid .  Patient unable to take Toradol  due to kidney function.  Notes po medications will not work. Admits to significant pain in bilateral knees. 0.5my diluadid given. [CA]    Clinical Course User Index [CA] [name] Aleck BROCKS, PA-C                                 Medical Decision Making Amount and/or Complexity of Data Reviewed External Data Reviewed: notes. Labs: ordered. Decision-making details documented in ED Course. Radiology: ordered and independent interpretation performed. Decision-making details documented in ED Course. ECG/medicine tests: ordered and independent interpretation performed. Decision-making details documented in ED Course.  Risk Prescription drug management. Decision regarding hospitalization.   This patient presents to the ED for concern of lower extremity pain, this involves an extensive number of treatment options, and is a complaint that carries with it a high risk of complications and morbidity.  The differential diagnosis includes sickle cell pain crisis, DVT, electrolyte abnormality,  etc  43 year old male with history of sickle cell disease presents to the ED due to possible sickle cell pain crisis.  Admits to pain in his bilateral lower extremities that started yesterday.  Took oxycodone  2 hours prior to arrival with no relief in pain.  Denies chest pain, shortness of breath, fever, nausea, and vomiting.  Upon arrival patient afebrile.  Tachycardic at 113 with normal O2 saturation.  Reassuring physical exam.  No lower extremity edema.  Able to move all 4 extremities without difficulty.  Routine labs ordered.  Reticulocytes.  Given tachycardia will obtain EKG and chest x-ray.  CBC with no leukocytosis.  Hemoglobin at 8 which appears to be around patient's baseline.  Does have thrombocytopenia at 119.  CMP with mild hyperkalemia at 5.5.  EKG with no T wave abnormalities.  Does appear patient had hyperkalemia 10 days ago at 5.3.  Discussed with Dr. Zammit.  Will give dose of Lokelma . Elevated creatinine at 3.1 and BUN of 47 which appears to be around patient's baseline.  Patient has known CKD stage IV.  Patient also has transaminitis which appears to be chronically elevated.  Has no right upper quadrant tenderness to suggest acute cholecystitis.  Reticulocytes at baseline.  X-ray personally reviewed and interpreted which is negative for any acute abnormalities.  Low suspicion for acute chest syndrome or PE.  After 1st dose of diluadid, patient drowsy for numerous hours. He was then given narcan  with improvement in mentation. See notes above. Denies any drug or alcohol  use before arrival. Upon reassessment, patient still complaining of pain.  Will discuss with hospitalist for admission for sickle cell pain crisis and hyperkalemia.  Patient given Lokelma  for hyperkalemia.  2:26 PM Discussed with sickle cell team who will admit patient.   Co morbidities that complicate the patient evaluation  Sickle cell disease Cardiac Monitoring: / EKG:  The patient was maintained on a cardiac  monitor.  I personally viewed and interpreted the cardiac monitored which showed an underlying rhythm of: NSR  Social Determinants of Health:  Chronic pain syndrome  Test / Admission - Considered:  Patient will require admission for sickle cell pain crisis and  hyperkalemia     Final diagnoses:  Sickle cell pain crisis [facility])  Hyperkalemia    ED Discharge Orders     None          [name] Aleck JAYSON DEVONNA 11/16/23 1435    Suzette Pac, MD 11/19/23 1611

## 2023-11-16 NOTE — ED Notes (Signed)
 Pt resting comfortably in bed. Eyes closed, equal chest rise and fall, no distress noted, VS WNL. Delay explained to pt previously.

## 2023-11-16 NOTE — ED Triage Notes (Signed)
 Patient arrived with complaints of ssc pain and swelling to his legs. Reports trying to go to the sickle cell clinic yesterday but they were closed.

## 2023-11-16 NOTE — ED Notes (Signed)
 Pt has already went to the restroom order put in afterwards

## 2023-11-17 ENCOUNTER — Other Ambulatory Visit: Payer: Self-pay

## 2023-11-17 DIAGNOSIS — D5700 Hb-SS disease with crisis, unspecified: Secondary | ICD-10-CM | POA: Diagnosis not present

## 2023-11-17 LAB — CBC
HCT: 21.1 % — ABNORMAL LOW (ref 39.0–52.0)
Hemoglobin: 7.1 g/dL — ABNORMAL LOW (ref 13.0–17.0)
MCH: 39.4 pg — ABNORMAL HIGH (ref 26.0–34.0)
MCHC: 33.6 g/dL (ref 30.0–36.0)
MCV: 117.2 fL — ABNORMAL HIGH (ref 80.0–100.0)
Platelets: 103 K/uL — ABNORMAL LOW (ref 150–400)
RBC: 1.8 MIL/uL — ABNORMAL LOW (ref 4.22–5.81)
WBC: 7.3 K/uL (ref 4.0–10.5)
nRBC: 0 % (ref 0.0–0.2)

## 2023-11-17 LAB — BASIC METABOLIC PANEL WITH GFR
Anion gap: 10 (ref 5–15)
BUN: 43 mg/dL — ABNORMAL HIGH (ref 6–20)
CO2: 17 mmol/L — ABNORMAL LOW (ref 22–32)
Calcium: 9 mg/dL (ref 8.9–10.3)
Chloride: 109 mmol/L (ref 98–111)
Creatinine, Ser: 2.49 mg/dL — ABNORMAL HIGH (ref 0.61–1.24)
GFR, Estimated: 33 mL/min — ABNORMAL LOW (ref >60)
Glucose, Bld: 95 mg/dL (ref 70–99)
Potassium: 5.1 mmol/L (ref 3.5–5.1)
Sodium: 135 mmol/L (ref 135–145)

## 2023-11-17 NOTE — Progress Notes (Signed)
 SICKLE CELL SERVICE PROGRESS NOTE  [name] [name]. FMW:[id number] DOB: 02-22-1980 DOA: 11/16/2023 PCP: [name], NP  Assessment/Plan: Principal Problem:   Sickle cell anemia with pain (HCC) Active Problems:   Sickle cell pain crisis (HCC)  Sickle cell pain crisis: Patient is currently on Dilaudid  PCA, OxyContin  and oxycodone .  Patient also getting hydrated.  Pain is improved.  Continue supportive care. Chronic pain syndrome: Continue with OxyContin  Chronic kidney disease stage IV: Creatinine is at baseline.  Continue to monitor Hyperkalemia: Patient getting Lokelma .  He has been refusing it but encouraged to take it.  Potassium was 5.5 today.  Will continue to monitor Depression with anxiety: Continue chronic home regimen Tobacco abuse: Tobacco cessation counseling given.  Code Status: Full code Family Communication: No family at bedside Disposition Plan: Home when ready  [facility]  Pager [phone]. If 7PM-7AM, please contact night-coverage.  11/17/2023, 10:27 AM  LOS: 1 day   Brief narrative: [name] [name]  is a 43 y.o. male  with history of sickle cell disease, Chronic pain syndrome, opiate dependence and tolerance, anemia of chronic disease, and CKD stage IV who presented to the ED with major complaints of generalized body pain but primarily in his lower extremities and back, rates pain at 9/10. He describes this pain as constant and achy and throbbing, Patient denies cough, shortness of breath, headache, dizziness, nausea, vomiting, diarrhea.  No urinary symptoms.  Consultants: None  Procedures: Chest x-ray  Antibiotics: None  HPI/Subjective: Patient's pain has improved but still at 7 out of 10.  His potassium is also elevated.  Renal function appears to be close to baseline.  No nausea vomiting or diarrhea.  Objective: Vitals:   11/17/23 0012 11/17/23 0409 11/17/23 0421 11/17/23 0822  BP: 111/83  99/78   Pulse: (!) 103     Resp: 17 18 16 15    Temp: 98.1 F (36.7 C)  98.3 F (36.8 C)   TempSrc: Oral  Oral   SpO2: 99%  100% 100%   Weight change:   Intake/Output Summary (Last 24 hours) at 11/17/2023 1027 Last data filed at 11/17/2023 0413 Gross per 24 hour  Intake 580.64 ml  Output 725 ml  Net -144.36 ml    General: Alert, awake, oriented x3, in no acute distress.  HEENT: [place]/AT PEERL, EOMI Neck: Trachea midline,  no masses, no thyromegal,y no JVD, no carotid bruit OROPHARYNX:  Moist, No exudate/ erythema/lesions.  Heart: Regular rate and rhythm, without murmurs, rubs, gallops, PMI non-displaced, no heaves or thrills on palpation.  Lungs: Clear to auscultation, no wheezing or rhonchi noted. No increased vocal fremitus resonant to percussion  Abdomen: Soft, nontender, nondistended, positive bowel sounds, no masses no hepatosplenomegaly noted..  Neuro: No focal neurological deficits noted cranial nerves II through XII grossly intact. DTRs 2+ bilaterally upper and lower extremities. Strength 5 out of 5 in bilateral upper and lower extremities. Musculoskeletal: No warm swelling or erythema around joints, no spinal tenderness noted. Psychiatric: Patient alert and oriented x3, good insight and cognition, good recent to remote recall. Lymph node survey: No cervical axillary or inguinal lymphadenopathy noted.   Data Reviewed: Basic Metabolic Panel: Recent Labs  Lab 11/16/23 0758  NA 138  K 5.5*  CL 107  CO2 20*  GLUCOSE 91  BUN 47*  CREATININE 3.17*  CALCIUM  9.0   Liver Function Tests: Recent Labs  Lab 11/16/23 0758  AST 61*  ALT 74*  ALKPHOS 249*  BILITOT 0.9  PROT 7.3  ALBUMIN  4.0  No results for input(s): LIPASE, AMYLASE in the last 168 hours. No results for input(s): AMMONIA in the last 168 hours. CBC: Recent Labs  Lab 11/16/23 0758 11/17/23 0705  WBC 5.0 7.3  NEUTROABS 1.7  --   HGB 8.0* 7.1*  HCT 24.1* 21.1*  MCV 117.6* 117.2*  PLT 119* 103*   Cardiac Enzymes: No results for input(s):  CKTOTAL, CKMB, CKMBINDEX, TROPONINI in the last 168 hours. BNP (last 3 results) No results for input(s): BNP in the last 8760 hours.  ProBNP (last 3 results) No results for input(s): PROBNP in the last 8760 hours.  CBG: No results for input(s): GLUCAP in the last 168 hours.  No results found for this or any previous visit (from the past 240 hours).   Studies: DG Chest Portable 1 View Result Date: 11/16/2023 EXAM: 1 VIEW(S) XRAY OF THE CHEST 11/16/2023 07:19:00 AM COMPARISON: 07/19/2023 CLINICAL HISTORY: tachycardia. Per chart - complaints of ssc pain and swelling to his legs FINDINGS: LUNGS AND PLEURA: Stable subsegmental scarring left lung base. No focal pulmonary opacity. No pulmonary edema. No pleural effusion. No pneumothorax. HEART AND MEDIASTINUM: No acute abnormality of the cardiac and mediastinal silhouettes. BONES AND SOFT TISSUES: Sclerosis of right humeral head. No acute osseous abnormality. IMPRESSION: 1. No acute cardiopulmonary process. 2. Stable subsegmental scarring at the left lung base. Electronically signed by: Christen Berrigan MD 11/16/2023 07:25 AM EDT RP Workstation: [id number]    Scheduled Meds:  amitriptyline   100 mg Oral QHS   calcitRIOL   0.25 mcg Oral Q breakfast   Deferiprone  (Twice Daily)  3,000 mg Oral Daily   enoxaparin  (LOVENOX ) injection  40 mg Subcutaneous Q24H   folic acid   1 mg Oral Daily   HYDROmorphone    Intravenous Q4H   oxyCODONE   15 mg Oral Q12H   senna-docusate  1 tablet Oral BID   sodium bicarbonate   1,950 mg Oral TID   sodium zirconium cyclosilicate   10 g Oral Daily   Continuous Infusions:  sodium chloride  75 mL/hr at 11/17/23 0413    Principal Problem:   Sickle cell anemia with pain [facility]) Active Problems:   Sickle cell pain crisis (HCC)

## 2023-11-17 NOTE — Plan of Care (Signed)

## 2023-11-17 NOTE — Progress Notes (Signed)
 During morning med pass patient was reluctant to take Lokelma , stating it makes him feel dizzy. Patient stated that he has stopped taking it but has not yet updated his nephrologist, but is following up with them in the next week. Patient was educated on importance of Lokelma  to lower potassium as it was 5.5 at last check. Patient agreeable to take Lokelma  this morning.

## 2023-11-18 LAB — CBC WITH DIFFERENTIAL/PLATELET
Abs Immature Granulocytes: 0.06 K/uL (ref 0.00–0.07)
Basophils Absolute: 0 K/uL (ref 0.0–0.1)
Basophils Relative: 0 %
Eosinophils Absolute: 0 K/uL (ref 0.0–0.5)
Eosinophils Relative: 0 %
HCT: 20.6 % — ABNORMAL LOW (ref 39.0–52.0)
Hemoglobin: 7.2 g/dL — ABNORMAL LOW (ref 13.0–17.0)
Immature Granulocytes: 1 %
Lymphocytes Relative: 72 %
Lymphs Abs: 6.8 K/uL — ABNORMAL HIGH (ref 0.7–4.0)
MCH: 39.8 pg — ABNORMAL HIGH (ref 26.0–34.0)
MCHC: 35 g/dL (ref 30.0–36.0)
MCV: 113.8 fL — ABNORMAL HIGH (ref 80.0–100.0)
Monocytes Absolute: 0.3 K/uL (ref 0.1–1.0)
Monocytes Relative: 3 %
Neutro Abs: 2.3 K/uL (ref 1.7–7.7)
Neutrophils Relative %: 24 %
Platelets: 112 K/uL — ABNORMAL LOW (ref 150–400)
RBC: 1.81 MIL/uL — ABNORMAL LOW (ref 4.22–5.81)
WBC: 9.4 K/uL (ref 4.0–10.5)
nRBC: 0.3 % — ABNORMAL HIGH (ref 0.0–0.2)

## 2023-11-18 LAB — COMPREHENSIVE METABOLIC PANEL WITH GFR
ALT: 44 U/L (ref 0–44)
AST: 40 U/L (ref 15–41)
Albumin: 3.5 g/dL (ref 3.5–5.0)
Alkaline Phosphatase: 215 U/L — ABNORMAL HIGH (ref 38–126)
Anion gap: 12 (ref 5–15)
BUN: 40 mg/dL — ABNORMAL HIGH (ref 6–20)
CO2: 18 mmol/L — ABNORMAL LOW (ref 22–32)
Calcium: 8.9 mg/dL (ref 8.9–10.3)
Chloride: 107 mmol/L (ref 98–111)
Creatinine, Ser: 2.39 mg/dL — ABNORMAL HIGH (ref 0.61–1.24)
GFR, Estimated: 34 mL/min — ABNORMAL LOW (ref >60)
Glucose, Bld: 90 mg/dL (ref 70–99)
Potassium: 5.3 mmol/L — ABNORMAL HIGH (ref 3.5–5.1)
Sodium: 136 mmol/L (ref 135–145)
Total Bilirubin: 0.8 mg/dL (ref 0.0–1.2)
Total Protein: 6.6 g/dL (ref 6.5–8.1)

## 2023-11-18 MED ORDER — OXYCODONE HCL ER 15 MG PO T12A
15.0000 mg | EXTENDED_RELEASE_TABLET | Freq: Two times a day (BID) | ORAL | 0 refills | Status: DC
  Filled 2023-11-18 – 2023-11-24 (×3): qty 60, 30d supply, fill #0

## 2023-11-18 NOTE — Progress Notes (Signed)
 [name] [name] states he is ready to be discharged. Dr. Sim notified. One prescription called in for pain medication. [name] [name] didn't want to go over his discharge instructions, he was in a hurry to leave. Discharged via wheelchair. Next medications due written on discharge papers.

## 2023-11-18 NOTE — Plan of Care (Signed)

## 2023-11-18 NOTE — Discharge Summary (Signed)
 Physician Discharge Summary   Patient: [name] [name]. MRN: [id number] DOB: Aug 17, 1980  Admit date:     11/16/2023  Discharge date: {dischdate:26783}  Discharge Physician: SIM KNOLL   PCP: [name], NP   Recommendations at discharge:  {Tip this will not be part of the note when signed- Example include specific recommendations for outpatient follow-up, pending tests to follow-up on. (Optional):26781}  ***  Discharge Diagnoses: Principal Problem:   Sickle cell anemia with pain (HCC) Active Problems:   Sickle cell pain crisis (HCC)  Resolved Problems:   * No resolved hospital problems. [facility] Course: No notes on file  Assessment and Plan: No notes have been filed under this hospital service. Service: Hospitalist     {Tip this will not be part of the note when signed There is no height or weight on file to calculate BMI. , ,  (Optional):26781}  {(NOTE) Pain control PDMP Statment (Optional):26782} Consultants: *** Procedures performed: ***  Disposition: {Plan; Disposition:26390} Diet recommendation:  Discharge Diet Orders (From admission, onward)     Start     Ordered   11/18/23 0000  Diet - low sodium heart healthy        11/18/23 1459           {Diet_Plan:26776} DISCHARGE MEDICATION: Allergies as of 11/18/2023       Reactions   Nsaids Other (See Comments)   Kidney disease   Morphine  Other (See Comments)   -Really bad headaches        Medication List     TAKE these medications    amitriptyline  50 MG tablet Commonly known as: ELAVIL  Take 100 mg by mouth at bedtime.   calcitRIOL  0.25 MCG capsule Commonly known as: ROCALTROL  Take 0.25 mcg by mouth daily with breakfast.   Ferriprox  Twice-A-Day 1000 MG Tabs Generic drug: Deferiprone  (Twice Daily) Take 3,000 mg by mouth daily.   folic acid  1 MG tablet Commonly known as: FOLVITE  TAKE 1 TABLET BY MOUTH EVERY DAY   hydroxyurea  500 MG capsule Commonly known as: HYDREA  Take  1 capsule by mouth in the morning on Sun/Tues/Wed/Thurs/Sat and 2 capsules on Mon/Fri   Oxycodone  HCl 10 MG Tabs Take 1 tablet (10 mg total) by mouth See admin instructions for 15 days. Take 10 mg by mouth every 6 hours   oxyCODONE  15 mg 12 hr tablet Commonly known as: OXYCONTIN  Take 1 tablet (15 mg) by mouth every 12 hours.   sodium bicarbonate  650 MG tablet Take 1,950 mg by mouth 3 (three) times daily.   sodium zirconium cyclosilicate  10 g Pack packet Commonly known as: LOKELMA  Take 10 g by mouth daily.   Tylenol  8 Hour Arthritis Pain 650 MG CR tablet Generic drug: acetaminophen  Take 1,300 mg by mouth as needed for pain.        Discharge Exam: There were no vitals filed for this visit. ***  Condition at discharge: {DC Condition:26389}  The results of significant diagnostics from this hospitalization (including imaging, microbiology, ancillary and laboratory) are listed below for reference.   Imaging Studies: DG Chest Portable 1 View Result Date: 11/16/2023 EXAM: 1 VIEW(S) XRAY OF THE CHEST 11/16/2023 07:19:00 AM COMPARISON: 07/19/2023 CLINICAL HISTORY: tachycardia. Per chart - complaints of ssc pain and swelling to his legs FINDINGS: LUNGS AND PLEURA: Stable subsegmental scarring left lung base. No focal pulmonary opacity. No pulmonary edema. No pleural effusion. No pneumothorax. HEART AND MEDIASTINUM: No acute abnormality of the cardiac and mediastinal silhouettes. BONES AND SOFT TISSUES: Sclerosis of  right humeral head. No acute osseous abnormality. IMPRESSION: 1. No acute cardiopulmonary process. 2. Stable subsegmental scarring at the left lung base. Electronically signed by: Evalene Coho MD 11/16/2023 07:25 AM EDT RP Workstation: [id number]    Microbiology: Results for orders placed or performed in visit on 08/20/23  Chlamydia/Gonococcus/Trichomonas, NAA     Status: None   Collection Time: 08/20/23  9:46 AM   Specimen: Urine   UR  Result Value Ref Range Status    Chlamydia by NAA Negative Negative Final   Gonococcus by NAA Negative Negative Final   Trich vag by NAA Negative Negative Final   *Note: Due to a large number of results and/or encounters for the requested time period, some results have not been displayed. A complete set of results can be found in Results Review.    Labs: CBC: Recent Labs  Lab 11/16/23 0758 11/17/23 0705 11/18/23 0726  WBC 5.0 7.3 9.4  NEUTROABS 1.7  --  2.3  HGB 8.0* 7.1* 7.2*  HCT 24.1* 21.1* 20.6*  MCV 117.6* 117.2* 113.8*  PLT 119* 103* 112*   Basic Metabolic Panel: Recent Labs  Lab 11/16/23 0758 11/17/23 0705 11/18/23 0652  NA 138 135 136  K 5.5* 5.1 5.3*  CL 107 109 107  CO2 20* 17* 18*  GLUCOSE 91 95 90  BUN 47* 43* 40*  CREATININE 3.17* 2.49* 2.39*  CALCIUM  9.0 9.0 8.9   Liver Function Tests: Recent Labs  Lab 11/16/23 0758 11/18/23 0652  AST 61* 40  ALT 74* 44  ALKPHOS 249* 215*  BILITOT 0.9 0.8  PROT 7.3 6.6  ALBUMIN  4.0 3.5   CBG: No results for input(s): GLUCAP in the last 168 hours.  Discharge time spent: {LESS THAN/GREATER UYJW:73611} 30 minutes.  SignedBETHA SIM KNOLL, MD [facility] 11/18/2023

## 2023-11-18 NOTE — Progress Notes (Signed)
   11/18/23 1506  TOC Brief Assessment  Insurance and Status Reviewed  Patient has primary care physician Yes  Home environment has been reviewed Apartment  Prior level of function: Independent  Prior/Current Home Services No current home services  Social Drivers of Health Review SDOH reviewed no interventions necessary  Readmission risk has been reviewed Yes  Transition of care needs no transition of care needs at this time    Signed: Heather Saltness, MSW, LCSW Clinical Social Worker Inpatient Care Management 11/18/2023 3:06 PM

## 2023-11-19 ENCOUNTER — Other Ambulatory Visit (HOSPITAL_COMMUNITY): Payer: Self-pay

## 2023-11-19 ENCOUNTER — Telehealth: Payer: Self-pay

## 2023-11-19 NOTE — Transitions of Care (Post Inpatient/ED Visit) (Signed)
   11/19/2023  Name: [name] [name]. MRN: [id number] DOB: 04-30-1980  Today's TOC FU Call Status: Today's TOC FU Call Status:: Successful TOC FU Call Completed TOC FU Call Complete Date: 11/19/23 Patient's Name and Date of Birth confirmed.  Transition Care Management Follow-up Telephone Call Date of Discharge: 11/18/23 Discharge Facility: [facility]) Type of Discharge: Inpatient Admission Primary Inpatient Discharge Diagnosis:: sickle cell anemia with pain How have you been since you were released from the hospital?: Same Any questions or concerns?: No  Items Reviewed: Did you receive and understand the discharge instructions provided?: Yes Medications obtained,verified, and reconciled?: No Medications Not Reviewed Reasons:: Other: (patient states all of his medications are the same and he doesn't want to go over them again.) Any new allergies since your discharge?: No Dietary orders reviewed?: Yes Type of Diet Ordered:: low salt heart healthy Do you have support at home?: Yes Name of Support/Comfort Primary Source: Patient states, I'm good I have support.  Medications Reviewed Today: patient declined review of medications.  He states he has reviewed them and doesn't want to review them again.  Medications Reviewed Today   Medications were not reviewed in this encounter     Home Care and Equipment/Supplies: Were Home Health Services Ordered?: No Any new equipment or medical supplies ordered?: No  Functional Questionnaire: Do you need assistance with bathing/showering or dressing?: No Do you need assistance with meal preparation?: No Do you need assistance with eating?: No Do you have difficulty maintaining continence: No Do you need assistance with getting out of bed/getting out of a chair/moving?: No Do you have difficulty managing or taking your medications?: No  Follow up appointments reviewed: PCP Follow-up appointment confirmed?: Yes (Patient states she  was scheduled to see his primary care provider on 11/20/23 however he states he cancelled it and rescheduled to 12/31/23.) Date of PCP follow-up appointment?: 12/31/23 Follow-up Provider: Bascom Borer, NP Do you need transportation to your follow-up appointment?: No Do you understand care options if your condition(s) worsen?: Yes-patient verbalized understanding  TOC 30 day program was not offered to patient due to patient not wanting to completed initial TOC assessment call.   Arvin Seip RN, BSN, CCM [employer], Population Health Case Manager Phone: [phone]

## 2023-11-20 ENCOUNTER — Other Ambulatory Visit: Payer: Self-pay

## 2023-11-20 ENCOUNTER — Other Ambulatory Visit (HOSPITAL_COMMUNITY): Payer: Self-pay

## 2023-11-20 NOTE — Hospital Course (Signed)
 Patient was admitted with sickle cell pain crisis.  Also chronic kidney disease stage IV, hyperkalemia as well as anemia of chronic disease.  Patient was on Dilaudid  PCA.  No Toradol  due to renal disease.  IV fluids and other supportive care.  He is H&H was followed but no transfusion was required.  Patient has gradually improved to where he was stable for discharge.  Pain was controlled.  Patient was prescribed his home pain medications at discharge and discharged home to follow-up with PCP.

## 2023-11-22 NOTE — Discharge Summary (Signed)
 Physician Discharge Summary   Patient: [name] [name]. MRN: [id number] DOB: 09/06/1980  Admit date:     11/16/2023  Discharge date: 11/18/2023  Discharge Physician: SIM KNOLL   PCP: [name], NP   Recommendations at discharge:    Follow up with PCP  Discharge Diagnoses: Principal Problem:   Sickle cell anemia with pain (HCC) Active Problems:   Sickle cell pain crisis (HCC)  Resolved Problems:   * No resolved hospital problems. [facility] Course: Patient was admitted with sickle cell pain crisis.  Also chronic kidney disease stage IV, hyperkalemia as well as anemia of chronic disease.  Patient was on Dilaudid  PCA.  No Toradol  due to renal disease.  IV fluids and other supportive care.  He is H&H was followed but no transfusion was required.  Patient has gradually improved to where he was stable for discharge.  Pain was controlled.  Patient was prescribed his home pain medications at discharge and discharged home to follow-up with PCP.  Assessment and Plan: No notes have been filed under this hospital service. Service: Hospitalist        Consultants: None Procedures performed: CXR  Disposition: Home Diet recommendation:  Discharge Diet Orders (From admission, onward)     Start     Ordered   11/18/23 0000  Diet - low sodium heart healthy        11/18/23 1459           Renal diet DISCHARGE MEDICATION: Allergies as of 11/18/2023       Reactions   Nsaids Other (See Comments)   Kidney disease   Morphine  Other (See Comments)   -Really bad headaches        Medication List     TAKE these medications    amitriptyline  50 MG tablet Commonly known as: ELAVIL  Take 100 mg by mouth at bedtime.   calcitRIOL  0.25 MCG capsule Commonly known as: ROCALTROL  Take 0.25 mcg by mouth daily with breakfast.   Ferriprox  Twice-A-Day 1000 MG Tabs Generic drug: Deferiprone  (Twice Daily) Take 3,000 mg by mouth daily.   folic acid  1 MG tablet Commonly  known as: FOLVITE  TAKE 1 TABLET BY MOUTH EVERY DAY   hydroxyurea  500 MG capsule Commonly known as: HYDREA  Take 1 capsule by mouth in the morning on Sun/Tues/Wed/Thurs/Sat and 2 capsules on Mon/Fri   Oxycodone  HCl 10 MG Tabs Take 1 tablet (10 mg total) by mouth See admin instructions for 15 days. Take 10 mg by mouth every 6 hours   oxyCODONE  15 mg 12 hr tablet Commonly known as: OXYCONTIN  Take 1 tablet (15 mg) by mouth every 12 hours.   sodium bicarbonate  650 MG tablet Take 1,950 mg by mouth 3 (three) times daily.   sodium zirconium cyclosilicate  10 g Pack packet Commonly known as: LOKELMA  Take 10 g by mouth daily.   Tylenol  8 Hour Arthritis Pain 650 MG CR tablet Generic drug: acetaminophen  Take 1,300 mg by mouth as needed for pain.        Discharge Exam: There were no vitals filed for this visit. Constitutional: NAD, calm, comfortable Eyes: PERRL, lids and conjunctivae normal ENMT: Mucous membranes are moist. Posterior pharynx clear of any exudate or lesions.Normal dentition.  Neck: normal, supple, no masses, no thyromegaly Respiratory: clear to auscultation bilaterally, no wheezing, no crackles. Normal respiratory effort. No accessory muscle use.  Cardiovascular: Regular rate and rhythm, no murmurs / rubs / gallops. No extremity edema. 2+ pedal pulses. No carotid bruits.  Abdomen: no tenderness,  no masses palpated. No hepatosplenomegaly. Bowel sounds positive.  Musculoskeletal: Good range of motion, no joint swelling or tenderness, Skin: no rashes, lesions, ulcers. No induration Neurologic: CN 2-12 grossly intact. Sensation intact, DTR normal. Strength 5/5 in all 4.  Psychiatric: Normal judgment and insight. Alert and oriented x 3. Normal mood   Condition at discharge: good  The results of significant diagnostics from this hospitalization (including imaging, microbiology, ancillary and laboratory) are listed below for reference.   Imaging Studies: DG Chest Portable 1  View Result Date: 11/16/2023 EXAM: 1 VIEW(S) XRAY OF THE CHEST 11/16/2023 07:19:00 AM COMPARISON: 07/19/2023 CLINICAL HISTORY: tachycardia. Per chart - complaints of ssc pain and swelling to his legs FINDINGS: LUNGS AND PLEURA: Stable subsegmental scarring left lung base. No focal pulmonary opacity. No pulmonary edema. No pleural effusion. No pneumothorax. HEART AND MEDIASTINUM: No acute abnormality of the cardiac and mediastinal silhouettes. BONES AND SOFT TISSUES: Sclerosis of right humeral head. No acute osseous abnormality. IMPRESSION: 1. No acute cardiopulmonary process. 2. Stable subsegmental scarring at the left lung base. Electronically signed by: Evalene Coho MD 11/16/2023 07:25 AM EDT RP Workstation: [id number]    Microbiology: Results for orders placed or performed in visit on 08/20/23  Chlamydia/Gonococcus/Trichomonas, NAA     Status: None   Collection Time: 08/20/23  9:46 AM   Specimen: Urine   UR  Result Value Ref Range Status   Chlamydia by NAA Negative Negative Final   Gonococcus by NAA Negative Negative Final   Trich vag by NAA Negative Negative Final   *Note: Due to a large number of results and/or encounters for the requested time period, some results have not been displayed. A complete set of results can be found in Results Review.    Labs: CBC: Recent Labs  Lab 11/16/23 0758 11/17/23 0705 11/18/23 0726  WBC 5.0 7.3 9.4  NEUTROABS 1.7  --  2.3  HGB 8.0* 7.1* 7.2*  HCT 24.1* 21.1* 20.6*  MCV 117.6* 117.2* 113.8*  PLT 119* 103* 112*   Basic Metabolic Panel: Recent Labs  Lab 11/16/23 0758 11/17/23 0705 11/18/23 0652  NA 138 135 136  K 5.5* 5.1 5.3*  CL 107 109 107  CO2 20* 17* 18*  GLUCOSE 91 95 90  BUN 47* 43* 40*  CREATININE 3.17* 2.49* 2.39*  CALCIUM  9.0 9.0 8.9   Liver Function Tests: Recent Labs  Lab 11/16/23 0758 11/18/23 0652  AST 61* 40  ALT 74* 44  ALKPHOS 249* 215*  BILITOT 0.9 0.8  PROT 7.3 6.6  ALBUMIN  4.0 3.5   CBG: No  results for input(s): GLUCAP in the last 168 hours.  Discharge time spent: greater than 30 minutes.  SignedBETHA SIM KNOLL, MD [facility] 11/22/2023

## 2023-11-24 ENCOUNTER — Other Ambulatory Visit (HOSPITAL_COMMUNITY): Payer: Self-pay

## 2023-11-27 ENCOUNTER — Other Ambulatory Visit: Payer: Self-pay | Admitting: Nurse Practitioner

## 2023-11-27 DIAGNOSIS — G894 Chronic pain syndrome: Secondary | ICD-10-CM

## 2023-11-27 DIAGNOSIS — D571 Sickle-cell disease without crisis: Secondary | ICD-10-CM

## 2023-11-27 NOTE — Telephone Encounter (Unsigned)
 Copied from CRM [id number]. Topic: Clinical - Medication Refill >> Nov 27, 2023 10:17 AM Tobias L wrote: Medication: Oxycodone  HCl 10 MG TABS Requesting refill to be sent to both of his providers, Tonya & Dr. [name]  Has the patient contacted their pharmacy? Yes Told to reach out to office.   This is the patient's preferred pharmacy:  [facility] [address] [place] - [address] AT [address] [place] [address] Phone: [phone] Fax: [phone]  Is this the correct pharmacy for this prescription? Yes  Has the prescription been filled recently? yes  Is the patient out of the medication? Yes  Has the patient been seen for an appointment in the last year OR does the patient have an upcoming appointment? Yes  Can we respond through MyChart? No  Agent: Please be advised that Rx refills may take up to 3 business days. We ask that you follow-up with your pharmacy.

## 2023-11-29 ENCOUNTER — Other Ambulatory Visit: Payer: Self-pay

## 2023-11-29 ENCOUNTER — Telehealth (HOSPITAL_COMMUNITY): Payer: Self-pay | Admitting: *Deleted

## 2023-11-29 DIAGNOSIS — D571 Sickle-cell disease without crisis: Secondary | ICD-10-CM

## 2023-11-29 MED ORDER — OXYCODONE HCL 10 MG PO TABS
10.0000 mg | ORAL_TABLET | ORAL | 0 refills | Status: AC
Start: 2023-11-29 — End: 2023-12-14

## 2023-11-29 NOTE — Telephone Encounter (Signed)
 Patient called requesting to come to the day hospital for pain management. Patient reports bilateral leg pain rated 8/10. Patient also reports feeling light-headed and dizzy. Reports taking Oxycontin  10 mg and Tylenol  at 5:00 am. Per patient, he is out of his Oxycodone  and needs a refill. Denies fever, chest pain, nausea, vomiting, diarrhea, abdominal pain and priapism. [name], NP notified and advised to tell patient that the day hospital is at capacity and unable to accept additional patients. Therisa, RN advised patient and notified patient's PCP about patient's need for Oxycodone  refill. Patient expresses an understanding.

## 2023-11-29 NOTE — Telephone Encounter (Signed)
 Copied from CRM [id number]. Topic: Clinical - Medication Question >> Nov 29, 2023  8:29 AM [name] [name] wrote: Reason for CRM: Patient called to follow up on oxycodone  prescription advised still pending, stated script is due today  Please advise [facility]

## 2023-12-04 ENCOUNTER — Encounter: Payer: Self-pay | Admitting: Nurse Practitioner

## 2023-12-04 ENCOUNTER — Other Ambulatory Visit: Payer: Self-pay

## 2023-12-04 ENCOUNTER — Ambulatory Visit: Admitting: Nurse Practitioner

## 2023-12-04 ENCOUNTER — Telehealth: Payer: Self-pay

## 2023-12-04 VITALS — BP 107/66 | HR 98

## 2023-12-04 DIAGNOSIS — R21 Rash and other nonspecific skin eruption: Secondary | ICD-10-CM | POA: Diagnosis not present

## 2023-12-04 MED ORDER — TRIAMCINOLONE ACETONIDE 0.1 % EX CREA: 1.0000 | TOPICAL_CREAM | Freq: Two times a day (BID) | CUTANEOUS | 0 refills | Status: DC

## 2023-12-04 NOTE — Patient Instructions (Addendum)
 1. Rash and other nonspecific skin eruption (Primary)  - triamcinolone cream (KENALOG) 0.1 %; Apply 1 Application topically 2 (two) times daily for one to 2 weeks and then as needed  Please Keep you skin well moisturized I recommended using Cetaphil or cerave  moisturizing cream. Let us  know if you do not feel better     It is important that you exercise regularly at least 30 minutes 5 times a week as tolerated  Think about what you will eat, plan ahead. Choose  clean, green, fresh or frozen over canned, processed or packaged foods which are more sugary, salty and fatty. 70 to 75% of food eaten should be vegetables and fruit. Three meals at set times with snacks allowed between meals, but they must be fruit or vegetables. Aim to eat over a 12 hour period , example 7 am to 7 pm, and STOP after  your last meal of the day. Drink water ,generally about 64 ounces per day, no other drink is as healthy. Fruit juice is best enjoyed in a healthy way, by EATING the fruit.  Thanks for choosing [facility] we consider it a privelige to serve you.

## 2023-12-04 NOTE — Telephone Encounter (Signed)
 Please cancel lab appt for today. He has to go to [facility]. [employer]

## 2023-12-04 NOTE — Assessment & Plan Note (Addendum)
 Recurrent pruritic bumps with scarring on buttocks Recurrent pruritic bumps on buttocks with scarring. Painful, especially when sitting.  Of note patient was treated with doxycycline  100 mg twice daily for 7 days in July 2025 for furuncle of buttocks No current drainage, only scarring visible. Etiology unclear. Triamcinolone 1% cream ordered apply twice daily for 1 to 2 weeks and then as needed for itching Advised to use Cetaphil or CeraVe lotion to keep skin well moisturized Consider referral to dermatology if condition persist

## 2023-12-04 NOTE — Progress Notes (Signed)
 Acute Office Visit  Subjective:     Patient ID: [name] DELENA Brunilda Mickey., male    DOB: Jun 20, 1980, 43 y.o.   MRN: [id number]  Chief Complaint  Patient presents with   Rash    Location on the bottom.   Discussed the use of AI scribe software for clinical note transcription with the patient, who gave verbal consent to proceed.  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 who presents with recurrent painful bumps on the buttocks.  He has been experiencing recurrent painful bumps on his buttocks for several months. These lesions grow, disappear, and then reappear. They are particularly painful when sitting and sometimes itch. Approximately two to three months ago, he was prescribed antibiotics, though he does not recall the name of the medication. Despite this treatment, the bumps continue to recur.  The bumps do not drain but are painful and itchy.  Assessment and Plan Assessment & Plan     Review of Systems  Constitutional:  Negative for appetite change, chills, fatigue and fever.  HENT:  Negative for congestion, ear discharge and ear pain.   Respiratory:  Negative for cough, shortness of breath and wheezing.   Cardiovascular:  Negative for chest pain, palpitations and leg swelling.   Gastrointestinal:  Negative for abdominal pain, constipation, nausea and vomiting.  Genitourinary:  Negative for difficulty urinating, dysuria, flank pain and frequency.  Musculoskeletal:  Negative for arthralgias, back pain, joint swelling and myalgias.  Skin:  Positive for rash. Negative for color change, pallor and wound.  Neurological:  Negative for dizziness, facial asymmetry, weakness, numbness and headaches.  Psychiatric/Behavioral:  Negative for behavioral problems, confusion, self-injury and suicidal ideas.         Objective:    BP 107/66   Pulse 98   SpO2 100%    Physical Exam Vitals and nursing note reviewed. Exam conducted with a chaperone present.  Constitutional:      General: He is not in acute distress.    Appearance: Normal appearance. He is not ill-appearing, toxic-appearing or diaphoretic.  Eyes:     General:        Right eye: No discharge.        Left eye: No discharge.     Extraocular Movements: Extraocular movements intact.  Cardiovascular:     Rate and Rhythm: Normal rate and regular rhythm.     Pulses: Normal pulses.     Heart sounds: Normal heart sounds. No murmur heard.    No friction rub. No gallop.  Pulmonary:     Effort: Pulmonary effort is normal. No respiratory distress.     Breath sounds: Normal breath sounds. No stridor. No wheezing, rhonchi or rales.  Chest:     Chest wall: No tenderness.  Abdominal:     General: There is no distension.     Palpations: Abdomen is soft.     Tenderness: There is no abdominal tenderness. There is no right CVA tenderness, left CVA tenderness  or guarding.  Musculoskeletal:        General: No swelling, tenderness, deformity or signs of injury.     Right lower leg: No edema.     Left lower leg: No edema.  Skin:    General: Skin is warm and dry.     Capillary Refill: Capillary refill takes less than 2 seconds.     Coloration: Skin is not jaundiced or pale.     Findings: No bruising, erythema or lesion.      Comments: No rashes noted on examination of the buttocks, hyperpigmented scarring noted  Neurological:     Mental Status: He is alert and oriented to person, place, and time.     Motor: No weakness.     Coordination: Coordination normal.     Gait: Gait normal.  Psychiatric:        Mood and Affect: Mood normal.        Behavior: Behavior normal.        Thought Content: Thought content normal.        Judgment: Judgment normal.     No results found for any visits on 12/04/23.      Assessment & Plan:   Problem List Items Addressed This Visit       Musculoskeletal and Integument   Rash and other nonspecific skin eruption - Primary   Recurrent pruritic bumps with scarring on buttocks Recurrent pruritic bumps on buttocks with scarring. Painful, especially when sitting.  Of note patient was treated with doxycycline  100 mg twice daily for 7 days in July 2025 for furuncle of buttocks No current drainage, only scarring visible. Etiology unclear. Triamcinolone 1% cream ordered apply twice daily for 1 to 2 weeks and then as needed for itching Advised to use Cetaphil or CeraVe lotion to keep skin well moisturized Consider referral to dermatology if condition persist      Relevant Medications   triamcinolone cream (KENALOG) 0.1 %    Meds ordered this encounter  Medications   triamcinolone cream (KENALOG) 0.1 %    Sig: Apply 1 Application topically 2 (two) times daily.    Dispense:  30 g    Refill:  0    No follow-ups on file.  [name] Cryder R Yakir Wenke, FNP

## 2023-12-10 ENCOUNTER — Ambulatory Visit: Payer: Self-pay | Admitting: Nurse Practitioner

## 2023-12-10 ENCOUNTER — Telehealth (HOSPITAL_COMMUNITY): Payer: Self-pay | Admitting: *Deleted

## 2023-12-10 ENCOUNTER — Non-Acute Institutional Stay (HOSPITAL_COMMUNITY)
Admission: AD | Admit: 2023-12-10 | Discharge: 2023-12-10 | Disposition: A | Discharge status: Home / Self Care | Source: Ambulatory Visit | Attending: Internal Medicine | Admitting: Internal Medicine

## 2023-12-10 DIAGNOSIS — M79606 Pain in leg, unspecified: Secondary | ICD-10-CM | POA: Diagnosis not present

## 2023-12-10 DIAGNOSIS — D5700 Hb-SS disease with crisis, unspecified: Secondary | ICD-10-CM | POA: Diagnosis present

## 2023-12-10 DIAGNOSIS — N184 Chronic kidney disease, stage 4 (severe): Secondary | ICD-10-CM | POA: Diagnosis not present

## 2023-12-10 DIAGNOSIS — G894 Chronic pain syndrome: Secondary | ICD-10-CM | POA: Diagnosis not present

## 2023-12-10 DIAGNOSIS — M549 Dorsalgia, unspecified: Secondary | ICD-10-CM | POA: Diagnosis not present

## 2023-12-10 DIAGNOSIS — F1120 Opioid dependence, uncomplicated: Secondary | ICD-10-CM | POA: Diagnosis not present

## 2023-12-10 LAB — RETICULOCYTES
Immature Retic Fract: 24.3 % — ABNORMAL HIGH (ref 2.3–15.9)
RBC.: 1.85 MIL/uL — ABNORMAL LOW (ref 4.22–5.81)
Retic Count, Absolute: 58.8 K/uL (ref 19.0–186.0)
Retic Ct Pct: 3.2 % — ABNORMAL HIGH (ref 0.4–3.1)

## 2023-12-10 LAB — COMPREHENSIVE METABOLIC PANEL WITH GFR
ALT: 59 U/L — ABNORMAL HIGH (ref 0–44)
AST: 106 U/L — ABNORMAL HIGH (ref 15–41)
Albumin: 3.7 g/dL (ref 3.5–5.0)
Alkaline Phosphatase: 235 U/L — ABNORMAL HIGH (ref 38–126)
Anion gap: 13 (ref 5–15)
BUN: 44 mg/dL — ABNORMAL HIGH (ref 6–20)
CO2: 25 mmol/L (ref 22–32)
Calcium: 9.2 mg/dL (ref 8.9–10.3)
Chloride: 103 mmol/L (ref 98–111)
Creatinine, Ser: 3.17 mg/dL — ABNORMAL HIGH (ref 0.61–1.24)
GFR, Estimated: 24 mL/min — ABNORMAL LOW (ref >60)
Glucose, Bld: 96 mg/dL (ref 70–99)
Potassium: 4.9 mmol/L (ref 3.5–5.1)
Sodium: 141 mmol/L (ref 135–145)
Total Bilirubin: 0.7 mg/dL (ref 0.0–1.2)
Total Protein: 7.3 g/dL (ref 6.5–8.1)

## 2023-12-10 LAB — CBC WITH DIFFERENTIAL/PLATELET
Abs Immature Granulocytes: 0.02 K/uL (ref 0.00–0.07)
Basophils Absolute: 0 K/uL (ref 0.0–0.1)
Basophils Relative: 0 %
Eosinophils Absolute: 0 K/uL (ref 0.0–0.5)
Eosinophils Relative: 0 %
HCT: 22.6 % — ABNORMAL LOW (ref 39.0–52.0)
Hemoglobin: 7.8 g/dL — ABNORMAL LOW (ref 13.0–17.0)
Immature Granulocytes: 0 %
Lymphocytes Relative: 54 %
Lymphs Abs: 3.9 K/uL (ref 0.7–4.0)
MCH: 41.7 pg — ABNORMAL HIGH (ref 26.0–34.0)
MCHC: 34.5 g/dL (ref 30.0–36.0)
MCV: 120.9 fL — ABNORMAL HIGH (ref 80.0–100.0)
Monocytes Absolute: 0.2 K/uL (ref 0.1–1.0)
Monocytes Relative: 3 %
Neutro Abs: 3.2 K/uL (ref 1.7–7.7)
Neutrophils Relative %: 43 %
Platelets: 270 K/uL (ref 150–400)
RBC: 1.87 MIL/uL — ABNORMAL LOW (ref 4.22–5.81)
WBC: 7.4 K/uL (ref 4.0–10.5)
nRBC: 0.3 % — ABNORMAL HIGH (ref 0.0–0.2)

## 2023-12-10 LAB — LACTATE DEHYDROGENASE: LDH: 185 U/L (ref 98–192)

## 2023-12-10 MED ORDER — NALOXONE HCL 0.4 MG/ML IJ SOLN: 0.4000 mg | INTRAMUSCULAR | Status: DC | PRN

## 2023-12-10 MED ORDER — DIPHENHYDRAMINE HCL 25 MG PO CAPS: 25.0000 mg | ORAL_CAPSULE | ORAL | Status: DC | PRN

## 2023-12-10 MED ORDER — SODIUM CHLORIDE 0.45 % IV SOLN: INTRAVENOUS | Status: DC

## 2023-12-10 MED ORDER — SODIUM CHLORIDE 0.9% FLUSH: 9.0000 mL | INTRAVENOUS | Status: DC | PRN

## 2023-12-10 MED ORDER — SENNOSIDES-DOCUSATE SODIUM 8.6-50 MG PO TABS: 1.0000 | ORAL_TABLET | Freq: Two times a day (BID) | ORAL | Status: DC

## 2023-12-10 MED ORDER — ONDANSETRON HCL 4 MG/2ML IJ SOLN
4.0000 mg | Freq: Four times a day (QID) | INTRAMUSCULAR | Status: DC | PRN
  Administered 2023-12-10: 4 mg via INTRAVENOUS
  Filled 2023-12-10: qty 2

## 2023-12-10 MED ORDER — ACETAMINOPHEN 500 MG PO TABS
1000.0000 mg | ORAL_TABLET | Freq: Once | ORAL | Status: AC
  Administered 2023-12-10: 1000 mg via ORAL
  Filled 2023-12-10: qty 2

## 2023-12-10 MED ORDER — HYDROMORPHONE 1 MG/ML IV SOLN
INTRAVENOUS | Status: DC
  Administered 2023-12-10: 4 mg via INTRAVENOUS
  Administered 2023-12-10: 5.5 mg via INTRAVENOUS
  Administered 2023-12-10: 30 mg via INTRAVENOUS
  Filled 2023-12-10: qty 30

## 2023-12-10 MED ORDER — POLYETHYLENE GLYCOL 3350 17 G PO PACK: 17.0000 g | PACK | Freq: Every day | ORAL | Status: DC | PRN

## 2023-12-10 NOTE — Progress Notes (Signed)
 Pt admitted to the day hospital today for sickle cell pain treatment. On arrival, patient rates 10/10 generalized pain. Pt received Dilaudid  PCA, IV Zofran , and hydrated with IV Fluids via PIV. Pt also received PO Tylenol . At discharge, pt rates pain 8/10. AVS offered, but pt declined. Pt is alert, oriented, and ambulatory at discharge.

## 2023-12-10 NOTE — Telephone Encounter (Signed)
 Patient called requesting to come to the day hospital for sickle cell pain. Patient reports achy, generalized pain rated 10/10. Reports taking Oxycodone  10 mg and Oxyconin 15 mg at 6:30 am. Denies fever, chest pain, nausea, vomiting, diarrhea, abdominal pain and priapism. Admits to having transportation without driving self. [name], NP notified and advised that patient come to the day hospital for pain management. Patient advised and expresses an understanding.

## 2023-12-10 NOTE — H&P (Signed)
 [facility] History and Physical  Cataldo A Brunilda Raddle. FMW:[id number] DOB: [birth year] DOA: 12/10/2023  PCP: [name], NP   Chief Complaint: No chief complaint on file.   HPI: [name] [name]. is a 43 y.o. male with history of sickle cell disease, Chronic pain syndrome, opiate dependence and tolerance, anemia of chronic disease, and CKD stage IV who presented to the day hospital with major complaints of generalized body pain but primarily in his lower extremities and back, rates pain at 10/10. He describes this pain as constant and achy and throbbing.  Patient is attributing pain to change in weather.  Systemic Review: General: The patient denies anorexia, fever, weight loss Cardiac: Denies chest pain, syncope, palpitations, pedal edema  Respiratory: Denies cough, shortness of breath, wheezing GI: Denies severe indigestion/heartburn, abdominal pain, nausea, vomiting, diarrhea and constipation GU: Denies hematuria, incontinence, dysuria  Musculoskeletal: Generalized body tenderness.   Skin: Denies suspicious skin lesions Neurologic: Denies focal weakness or numbness, change in vision  Past Medical History:  Diagnosis Date   Abscess of right iliac fossa 09/24/2018   required Perc Drain 09/24/2018   Arachnoid Cyst of brain bilaterally    2 really small ones in the back of my head; inside; saw them w/MRI (09/25/2012)   Bacterial pneumonia ~ 2012   caught it here in the hospital (09/25/2012)   Chronic kidney disease    from my sickle cell (09/25/2012)   CKD (chronic kidney disease), stage II    Colitis 04/19/2017   CT scan abd/ pelvis   GERD (gastroesophageal reflux disease)    after I eat alot of spicey foods (09/25/2012)   Gynecomastia, male 07/10/2012   History of blood transfusion    always related to sickle cell crisis (09/25/2012)   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   Noted in noted from Hematology notes at [facility]    Migraines    take RX qd to prevent them (09/25/2012)   Opioid dependence with withdrawal (HCC) 08/30/2012   Renal insufficiency    Sickle cell anemia (HCC)    Sickle cell crisis (HCC) 09/25/2012   Sickle cell nephropathy (HCC) 07/10/2012   Sinus tachycardia    Tachycardia with heart rate 121-140 beats per minute with ambulation 08/04/2016    Past Surgical History:  Procedure Laterality Date   ABCESS DRAINAGE     CHOLECYSTECTOMY  ~ 2012   COLONOSCOPY N/A 04/23/2017   Procedure: COLONOSCOPY;  Surgeon: [name], MD;  Location: [facility];  Service: Endoscopy;  Laterality: N/A;   IR FLUORO GUIDE CV LINE RIGHT  12/17/2016   IR FLUORO GUIDE CV LINE RIGHT  02/07/2021   IR RADIOLOGIST EVAL & MGMT  10/02/2018   IR RADIOLOGIST EVAL & MGMT  10/15/2018   IR REMOVAL TUN CV CATH W/O FL  12/21/2016   IR REMOVAL TUN CV CATH W/O FL  02/11/2021   IR US  GUIDE VASC ACCESS RIGHT  12/17/2016   IR US  GUIDE VASC ACCESS RIGHT  02/07/2021   spleenectomy      Allergies  Allergen Reactions   Nsaids Other (See Comments)    Kidney disease   Morphine  Other (See Comments)    -Really bad headaches    Family History  Problem Relation Age of Onset   Breast cancer Mother       Prior to Admission medications   Medication Sig Start Date End Date Taking? Authorizing Provider  amitriptyline  (ELAVIL ) 50 MG tablet Take 100 mg by mouth at bedtime. 09/20/23  [provider]  calcitRIOL  (ROCALTROL ) 0.25 MCG capsule Take 0.25 mcg by mouth daily with breakfast. 11/16/20   [provider]  Deferiprone , Twice Daily, (FERRIPROX  TWICE-A-DAY) 1000 MG TABS Take 3,000 mg by mouth daily. 05/01/22   Tilford Bertram HERO, FNP  folic acid  (FOLVITE ) 1 MG tablet TAKE 1 TABLET BY MOUTH EVERY DAY 03/22/20   Hollis, Lachina M, FNP  hydroxyurea  (HYDREA ) 500 MG capsule Take 1 capsule by mouth in the morning on Sun/Tues/Wed/Thurs/Sat and 2 capsules on Mon/Fri 01/25/23   Nichols, [name], NP  oxyCODONE  (OXYCONTIN ) 15 mg 12 hr  tablet Take 1 tablet (15 mg) by mouth every 12 hours. 11/18/23 12/24/23  Sim Emery CROME, MD  Oxycodone  HCl 10 MG TABS Take 1 tablet (10 mg total) by mouth See admin instructions for 15 days. Take 10 mg by mouth every 6 hours 11/29/23 12/14/23  Nichols, [name], NP  sodium bicarbonate  650 MG tablet Take 1,950 mg by mouth 3 (three) times daily. 11/07/18   [provider]  sodium zirconium cyclosilicate  (LOKELMA ) 10 g PACK packet Take 10 g by mouth daily. Patient not taking: Reported on 12/04/2023 04/21/23 04/22/24  [provider]  triamcinolone cream (KENALOG) 0.1 % Apply 1 Application topically 2 (two) times daily. 12/04/23   Paseda, Folashade R, FNP  TYLENOL  8 HOUR ARTHRITIS PAIN 650 MG CR tablet Take 1,300 mg by mouth as needed for pain.    [provider]     Physical Exam: Vitals:   12/10/23 1000 12/10/23 1013  BP: 115/84   Pulse: 90   Resp: 16 16  Temp: 97.6 F (36.4 C)   TempSrc: Temporal   SpO2: 100%     General: Alert, awake, afebrile, anicteric, not in obvious distress HEENT: Normocephalic and Atraumatic, Mucous membranes pink                PERRLA; EOM intact; No scleral icterus,                 Nares: Patent, Oropharynx: Clear, Fair Dentition                 Neck: FROM, no cervical lymphadenopathy, thyromegaly, carotid bruit or JVD;  CHEST WALL: No tenderness  CHEST: Normal respiration, clear to auscultation bilaterally  HEART: Regular rate and rhythm; no murmurs rubs or gallops  BACK: No kyphosis or scoliosis; no CVA tenderness  ABDOMEN: Positive Bowel Sounds, soft, non-tender; no masses, no organomegaly EXTREMITIES: No cyanosis, clubbing, or edema SKIN:  no rash or ulceration  CNS: Alert and Oriented x 4, Nonfocal exam, CN 2-12 intact  Labs on Admission:  Basic Metabolic Panel: No results for input(s): NA, K, CL, CO2, GLUCOSE, BUN, CREATININE, CALCIUM , MG, PHOS in the last 168 hours. Liver Function Tests: No results for  input(s): AST, ALT, ALKPHOS, BILITOT, PROT, ALBUMIN  in the last 168 hours. No results for input(s): LIPASE, AMYLASE in the last 168 hours. No results for input(s): AMMONIA in the last 168 hours. CBC: No results for input(s): WBC, NEUTROABS, HGB, HCT, MCV, PLT in the last 168 hours. Cardiac Enzymes: No results for input(s): CKTOTAL, CKMB, CKMBINDEX, TROPONINI in the last 168 hours.  BNP (last 3 results) No results for input(s): BNP in the last 8760 hours.  ProBNP (last 3 results) No results for input(s): PROBNP in the last 8760 hours.  CBG: No results for input(s): GLUCAP in the last 168 hours.   Assessment/Plan Principal Problem:   Sickle cell anemia with pain (HCC)  Admits  to the Day Hospital for extended observation IVF 0.45% Saline @ 125 mls/hour Weight based Dilaudid  PCA started within 30 minutes of admission Acetaminophen  1000 mg x 1 dose Labs: CBCD, CMP, Retic Count and LDH Monitor vitals very closely, Re-evaluate pain scale every hour 2 L of Oxygen  by [place] Patient will be re-evaluated for pain in the context of function and relationship to baseline as care progresses. If no significant relieve from pain (remains above 5/10) will transfer patient to inpatient services for further evaluation and management  Code Status: Full  Family Communication: None  DVT Prophylaxis: Ambulate as tolerated   Time spent: 35 Minutes  Homer CHRISTELLA Cover NP  If 7PM-7AM,12/10/2023, 10:45 AM

## 2023-12-10 NOTE — Discharge Summary (Signed)
 Physician Discharge Summary  [name] [name]. FMW:[id number] DOB: 1980/05/06 DOA: 12/10/2023  PCP: [name], NP  Admit date: 12/10/2023  Discharge date: 12/10/2023  Time spent: 30 minutes  Discharge Diagnoses:  Principal Problem:   Sickle cell anemia with pain (HCC)   Discharge Condition: Stable  Diet recommendation: Regular  History of present illness:  [name] [name]. is a 43 y.o. male with history of sickle cell disease, Chronic pain syndrome, opiate dependence and tolerance, anemia of chronic disease, and CKD stage IV who presented to the day hospital with major complaints of generalized body pain but primarily in his lower extremities and back, rates pain at 10/10. He describes this pain as constant and achy and throbbing.  Patient is attributing pain to change in weather.   Hospital Course:  [name] [name]. was admitted to the day hospital with sickle cell painful crisis. Patient was treated with IV fluid, weight based IV Dilaudid . Thorn showed improvement symptomatically, pain improved from 10/10 to 8/10 at the time of discharge. Patient was discharged home in a hemodynamically stable condition. [place] will follow-up at the clinic as previously scheduled, continue with home medications as per prior to admission.  Discharge Instructions We discussed the need for good hydration, monitoring of hydration status, avoidance of heat, cold, stress, and infection triggers. We discussed the need to be compliant with taking his home medications. [name] was reminded of the need to seek medical attention immediately if any symptom of bleeding, anemia, or infection occurs.  Discharge Exam: Vitals:   12/10/23 1406 12/10/23 1533  BP: 126/85 (!) 139/91  Pulse: 97 79  Resp: 10 12  Temp:    SpO2: 100% 100%    General appearance: alert, cooperative and no distress Eyes: conjunctivae/corneas clear. PERRL, EOM's intact. Fundi benign. Neck: no adenopathy, no  carotid bruit, no JVD, supple, symmetrical, trachea midline and thyroid  not enlarged, symmetric, no tenderness/mass/nodules Back: symmetric, no curvature. ROM normal. No CVA tenderness. Resp: clear to auscultation bilaterally Chest wall: no tenderness Cardio: regular rate and rhythm, S1, S2 normal, no murmur, click, rub or gallop GI: soft, non-tender; bowel sounds normal; no masses,  no organomegaly Extremities: extremities normal, atraumatic, no cyanosis or edema Pulses: 2+ and symmetric Skin: Skin color, texture, turgor normal. No rashes or lesions Neurologic: Grossly normal   Allergies as of 12/10/2023       Reactions   Nsaids Other (See Comments)   Kidney disease   Morphine  Other (See Comments)   -Really bad headaches        Medication List     TAKE these medications    amitriptyline  50 MG tablet Commonly known as: ELAVIL  Take 100 mg by mouth at bedtime.   calcitRIOL  0.25 MCG capsule Commonly known as: ROCALTROL  Take 0.25 mcg by mouth daily with breakfast.   Ferriprox  Twice-A-Day 1000 MG Tabs Generic drug: Deferiprone  (Twice Daily) Take 3,000 mg by mouth daily.   folic acid  1 MG tablet Commonly known as: FOLVITE  TAKE 1 TABLET BY MOUTH EVERY DAY   hydroxyurea  500 MG capsule Commonly known as: HYDREA  Take 1 capsule by mouth in the morning on Sun/Tues/Wed/Thurs/Sat and 2 capsules on Mon/Fri   OxyCONTIN  15 MG 12 hr tablet Generic drug: oxyCODONE  Take 1 tablet (15 mg) by mouth every 12 hours.   Oxycodone  HCl 10 MG Tabs Take 1 tablet (10 mg total) by mouth See admin instructions for 15 days. Take 10 mg by mouth every 6 hours   sodium bicarbonate  650  MG tablet Take 1,950 mg by mouth 3 (three) times daily.   sodium zirconium cyclosilicate  10 g Pack packet Commonly known as: LOKELMA  Take 10 g by mouth daily.   triamcinolone cream 0.1 % Commonly known as: KENALOG Apply 1 Application topically 2 (two) times daily.   Tylenol  8 Hour Arthritis Pain 650 MG CR  tablet Generic drug: acetaminophen  Take 1,300 mg by mouth as needed for pain.       Allergies  Allergen Reactions   Nsaids Other (See Comments)    Kidney disease   Morphine  Other (See Comments)    -Really bad headaches     Significant Diagnostic Studies: DG Chest Portable 1 View Result Date: 11/16/2023 EXAM: 1 VIEW(S) XRAY OF THE CHEST 11/16/2023 07:19:00 AM COMPARISON: 07/19/2023 CLINICAL HISTORY: tachycardia. Per chart - complaints of ssc pain and swelling to his legs FINDINGS: LUNGS AND PLEURA: Stable subsegmental scarring left lung base. No focal pulmonary opacity. No pulmonary edema. No pleural effusion. No pneumothorax. HEART AND MEDIASTINUM: No acute abnormality of the cardiac and mediastinal silhouettes. BONES AND SOFT TISSUES: Sclerosis of right humeral head. No acute osseous abnormality. IMPRESSION: 1. No acute cardiopulmonary process. 2. Stable subsegmental scarring at the left lung base. Electronically signed by: [name] Coho MD 11/16/2023 07:25 AM EDT RP Workstation: [id number]    Signed:  Homer CHRISTELLA Cover NP   12/10/2023, 3:53 PM

## 2023-12-10 NOTE — Telephone Encounter (Signed)
 FYI Only or Action Required?: Action required by provider: request for appointment.  Patient was last seen in primary care on 12/04/2023 by Paseda, Folashade R, FNP.  Called Nurse Triage reporting Generalized Body Aches.  Symptoms began yesterday.  Interventions attempted: Nothing.  Symptoms are: gradually worsening. Sickle cell pain all over. Warm [facility].  Triage Disposition: See HCP Within 4 Hours (Or PCP Triage)  Patient/caregiver understands and will follow disposition?: Yes     Copied from CRM #[id number]. Topic: Clinical - Red Word Triage >> Dec 10, 2023  8:00 AM Mia F wrote: Red Word that prompted transfer to Nurse Triage: Pt is calling and asking to speak with the [facility] to come in for pain. Started yesterday and is getting worse. Sickle Cell pt Reason for Disposition  [1] SEVERE pain (e.g., excruciating, unable to do any normal activities) AND [2] not improved 2 hours after pain medicine  Answer Assessment - Initial Assessment Questions 1. ONSET: When did the muscle aches or body pains start?      Yesterday 2. LOCATION: What part of your body is hurting? (e.g., entire body, arms, legs)      All over 3. SEVERITY: How bad is the pain? (Scale 1-10; or mild, moderate, severe)     severe 4. CAUSE: What do you think is causing the pains?     Sickle cell 5. FEVER: Do you have a fever? If Yes, ask: What is your temperature, how was it measured, and  when did it start?      no 6. OTHER SYMPTOMS: Do you have any other symptoms? (e.g., chest pain, cold or flu symptoms, rash, weakness, weight loss)     no 7. PREGNANCY: Is there any chance you are pregnant? When was your last menstrual period?     N/a 8. TRAVEL: Have you traveled out of the country in the last month? (e.g., exposures, travel history)     no  Protocols used: Muscle Aches and Body Pain-A-AH

## 2023-12-17 ENCOUNTER — Encounter (HOSPITAL_COMMUNITY): Payer: Self-pay

## 2023-12-17 ENCOUNTER — Emergency Department (HOSPITAL_COMMUNITY)
Admission: EM | Admit: 2023-12-17 | Discharge: 2023-12-17 | Disposition: A | Discharge status: Home / Self Care | Attending: Emergency Medicine | Admitting: Emergency Medicine

## 2023-12-17 ENCOUNTER — Other Ambulatory Visit: Payer: Self-pay

## 2023-12-17 DIAGNOSIS — R000 Tachycardia, unspecified: Secondary | ICD-10-CM | POA: Diagnosis not present

## 2023-12-17 DIAGNOSIS — Z720 Tobacco use: Secondary | ICD-10-CM | POA: Diagnosis not present

## 2023-12-17 DIAGNOSIS — D5700 Hb-SS disease with crisis, unspecified: Secondary | ICD-10-CM | POA: Insufficient documentation

## 2023-12-17 DIAGNOSIS — N184 Chronic kidney disease, stage 4 (severe): Secondary | ICD-10-CM | POA: Insufficient documentation

## 2023-12-17 LAB — CBC
HCT: 24.8 % — ABNORMAL LOW (ref 39.0–52.0)
Hemoglobin: 8.6 g/dL — ABNORMAL LOW (ref 13.0–17.0)
MCH: 41.1 pg — ABNORMAL HIGH (ref 26.0–34.0)
MCHC: 34.7 g/dL (ref 30.0–36.0)
MCV: 118.7 fL — ABNORMAL HIGH (ref 80.0–100.0)
Platelets: 150 K/uL (ref 150–400)
RBC: 2.09 MIL/uL — ABNORMAL LOW (ref 4.22–5.81)
WBC: 7.5 K/uL (ref 4.0–10.5)
nRBC: 0 % (ref 0.0–0.2)

## 2023-12-17 LAB — COMPREHENSIVE METABOLIC PANEL WITH GFR
ALT: 72 U/L — ABNORMAL HIGH (ref 0–44)
AST: 72 U/L — ABNORMAL HIGH (ref 15–41)
Albumin: 4.1 g/dL (ref 3.5–5.0)
Alkaline Phosphatase: 239 U/L — ABNORMAL HIGH (ref 38–126)
Anion gap: 12 (ref 5–15)
BUN: 57 mg/dL — ABNORMAL HIGH (ref 6–20)
CO2: 22 mmol/L (ref 22–32)
Calcium: 9.6 mg/dL (ref 8.9–10.3)
Chloride: 104 mmol/L (ref 98–111)
Creatinine, Ser: 2.73 mg/dL — ABNORMAL HIGH (ref 0.61–1.24)
GFR, Estimated: 29 mL/min — ABNORMAL LOW (ref >60)
Glucose, Bld: 112 mg/dL — ABNORMAL HIGH (ref 70–99)
Potassium: 4.1 mmol/L (ref 3.5–5.1)
Sodium: 139 mmol/L (ref 135–145)
Total Bilirubin: 1.1 mg/dL (ref 0.0–1.2)
Total Protein: 7.8 g/dL (ref 6.5–8.1)

## 2023-12-17 LAB — RETICULOCYTES: RBC.: 2.07 MIL/uL — ABNORMAL LOW (ref 4.22–5.81)

## 2023-12-17 MED ORDER — SODIUM CHLORIDE 0.9 % IV BOLUS
1000.0000 mL | Freq: Once | INTRAVENOUS | Status: AC
  Administered 2023-12-17: 1000 mL via INTRAVENOUS

## 2023-12-17 MED ORDER — HYDROMORPHONE HCL 1 MG/ML IJ SOLN
2.0000 mg | Freq: Once | INTRAMUSCULAR | Status: AC
  Administered 2023-12-17: 2 mg via INTRAVENOUS
  Filled 2023-12-17: qty 2

## 2023-12-17 MED ORDER — OXYCODONE HCL 5 MG PO TABS: 10.0000 mg | ORAL_TABLET | ORAL | 0 refills | Status: DC | PRN

## 2023-12-17 MED ORDER — OXYCODONE-ACETAMINOPHEN 5-325 MG PO TABS
2.0000 | ORAL_TABLET | Freq: Once | ORAL | Status: AC
  Administered 2023-12-17: 2 via ORAL
  Filled 2023-12-17: qty 2

## 2023-12-17 MED ORDER — DIPHENHYDRAMINE HCL 25 MG PO CAPS
25.0000 mg | ORAL_CAPSULE | Freq: Once | ORAL | Status: AC
  Administered 2023-12-17: 25 mg via ORAL
  Filled 2023-12-17: qty 1

## 2023-12-17 NOTE — ED Provider Notes (Signed)
  Physical Exam  BP 121/83   Pulse (!) 112   Temp 98.2 F (36.8 C) (Oral)   Resp 18   SpO2 100%   Physical Exam Vitals and nursing note reviewed.  Constitutional:      General: He is not in acute distress.    Appearance: Normal appearance. He is well-developed and normal weight.  HENT:     Head: Normocephalic and atraumatic.  Eyes:     Extraocular Movements: Extraocular movements intact.     Conjunctiva/sclera: Conjunctivae normal.     Pupils: Pupils are equal, round, and reactive to light.  Cardiovascular:     Rate and Rhythm: Normal rate and regular rhythm.     Heart sounds: No murmur heard. Pulmonary:     Effort: Pulmonary effort is normal. No respiratory distress.     Breath sounds: Normal breath sounds.  Abdominal:     Palpations: Abdomen is soft.     Tenderness: There is no abdominal tenderness.  Musculoskeletal:        General: No swelling. Normal range of motion.     Cervical back: Neck supple.  Skin:    General: Skin is warm and dry.     Capillary Refill: Capillary refill takes less than 2 seconds.  Neurological:     Mental Status: He is alert.  Psychiatric:        Mood and Affect: Mood normal.     Procedures  Procedures  ED Course / MDM    Medical Decision Making Amount and/or Complexity of Data Reviewed Labs: ordered.  Risk Prescription drug management.   [name] [name]. is a 43 y.o. male who presented to the ED today with primary concern of of lower extremity pain consistent with previous sickle cell crisis.  He does have a history of sickle cell disease along with a history of CKD stage IV, and chronic pain syndrome.  He had denied any abdominal pain, further denies any chest pain or shortness of breath.  Care signed off by see C. Groce, PA-C.  At time of care assumption he had received 2 mg of Dilaudid  along with diphenhydramine , had also received a oxycodone  dose.  Currently receiving saline bolus, lab workup ongoing including CMP, CBC,  reticulocyte count.  Care plan at this time is to evaluate his CBC and reticulocytes, if within normal limits plan to discharge to outpatient pain management center.  After second dose of Dilaudid  patient states that he has had improvement of his pain, he has also received IV fluid bolus prior to reassessment.  Given his history of chronic pain, will prescribe very short course of oxycodone  to be used until he is able to get in with his pain management clinic.  Otherwise stressed to the importance of him following up with his regular pain management clinic for further assessment.  Given the remainder of the exam is within normal limits, he does not have any shortness of breath or chest pain consistent with acute chest syndrome or any other concerning sequelae of sickle cell disease, will discharge patient at this time as previously noted with outpatient follow-up.       [name] [name], [place] 12/17/23 9146    Levander Houston, MD 12/18/23 1240

## 2023-12-17 NOTE — ED Provider Notes (Signed)
 [place] EMERGENCY DEPARTMENT AT [facility] Provider Note   CSN: [id number] Arrival date & time: 12/17/23  0532     Patient presents with: Sickle Cell Pain Crisis   [name] A Treshon Stannard. is a 43 y.o. male with history of chronic kidney disease stage IV, gynecomastia, sickle cell crises, tobacco abuse, chronic pain syndrome, tachycardia.  Presents to ED complaining of sickle cell pain crisis, reports pain all over mainly in lower extremities.  Consistent with past sickle cell crises.  Reports he has not had opioid pain medication for the last 2 days.  Denies fevers at home.  Endorsing slight nausea without vomiting.  Denies abdominal pain.  Denies leg swelling.  Denies chest pain or shortness of breath. No blood thinners.    Sickle Cell Pain Crisis      Prior to Admission medications   Medication Sig Start Date End Date Taking? Authorizing Provider  amitriptyline  (ELAVIL ) 50 MG tablet Take 100 mg by mouth at bedtime. 09/20/23   [provider]  calcitRIOL  (ROCALTROL ) 0.25 MCG capsule Take 0.25 mcg by mouth daily with breakfast. 11/16/20   [provider]  Deferiprone , Twice Daily, (FERRIPROX  TWICE-A-DAY) 1000 MG TABS Take 3,000 mg by mouth daily. 05/01/22   Tilford Bertram HERO, FNP  folic acid  (FOLVITE ) 1 MG tablet TAKE 1 TABLET BY MOUTH EVERY DAY 03/22/20   Hollis, Lachina M, FNP  hydroxyurea  (HYDREA ) 500 MG capsule Take 1 capsule by mouth in the morning on Sun/Tues/Wed/Thurs/Sat and 2 capsules on Mon/Fri 01/25/23   Nichols, [name], NP  oxyCODONE  (OXYCONTIN ) 15 mg 12 hr tablet Take 1 tablet (15 mg) by mouth every 12 hours. 11/18/23 12/24/23  Sim Emery CROME, MD  sodium bicarbonate  650 MG tablet Take 1,950 mg by mouth 3 (three) times daily. 11/07/18   [provider]  sodium zirconium cyclosilicate  (LOKELMA ) 10 g PACK packet Take 10 g by mouth daily. Patient not taking: Reported on 12/04/2023 04/21/23 04/22/24  [provider]  triamcinolone cream  (KENALOG) 0.1 % Apply 1 Application topically 2 (two) times daily. 12/04/23   Paseda, Folashade R, FNP  TYLENOL  8 HOUR ARTHRITIS PAIN 650 MG CR tablet Take 1,300 mg by mouth as needed for pain.    [provider]    Allergies: Nsaids and Morphine     Review of Systems  Musculoskeletal:  Positive for arthralgias.  All other systems reviewed and are negative.   Updated Vital Signs BP 121/83   Pulse (!) 112   Temp 98.2 F (36.8 C) (Oral)   Resp 18   SpO2 100%   Physical Exam Vitals and nursing note reviewed.  Constitutional:      General: He is not in acute distress.    Appearance: He is well-developed.  HENT:     Head: Normocephalic and atraumatic.  Eyes:     Conjunctiva/sclera: Conjunctivae normal.  Cardiovascular:     Rate and Rhythm: Regular rhythm. Tachycardia present.     Heart sounds: No murmur heard. Pulmonary:     Effort: Pulmonary effort is normal. No respiratory distress.     Breath sounds: Normal breath sounds.  Abdominal:     Palpations: Abdomen is soft.     Tenderness: There is no abdominal tenderness.  Musculoskeletal:        General: No swelling.     Cervical back: Neck supple.     Right lower leg: No edema.     Left lower leg: No edema.  Skin:    General: Skin  is warm and dry.     Capillary Refill: Capillary refill takes less than 2 seconds.  Neurological:     Mental Status: He is alert and oriented to person, place, and time. Mental status is at baseline.  Psychiatric:        Mood and Affect: Mood normal.     (all labs ordered are listed, but only abnormal results are displayed) Labs Reviewed  CBC  RETICULOCYTES  COMPREHENSIVE METABOLIC PANEL WITH GFR    EKG: EKG Interpretation Date/Time:  Monday December 17 2023 05:50:08 EST Ventricular Rate:  103 PR Interval:  170 QRS Duration:  91 QT Interval:  319 QTC Calculation: 418 R Axis:   35  Text Interpretation: Sinus tachycardia Probable inferior infarct, old Lateral leads are  also involved No significant change was found Confirmed by Trine Likes [phone]) on 12/17/2023 5:51:04 AM  Radiology: No results found.  Procedures   Medications Ordered in the ED  HYDROmorphone  (DILAUDID ) injection 2 mg (has no administration in time range)  diphenhydrAMINE  (BENADRYL ) capsule 25 mg (has no administration in time range)  oxyCODONE -acetaminophen  (PERCOCET/ROXICET) 5-325 MG per tablet 2 tablet (has no administration in time range)  sodium chloride  0.9 % bolus 1,000 mL (has no administration in time range)    Medical Decision Making Amount and/or Complexity of Data Reviewed Labs: ordered.  Risk Prescription drug management.   43 year old male presents for evaluation.  Patient reporting with sickle cell crisis.  On exam, afebrile however tachycardic.  Lung sounds clear bilaterally, no hypoxia.  Abdomen soft and compressible.  Neuroexam at baseline.  Patient overall in no apparent distress.  Patient reports he has been out of his opioid pain medication for 2 days.  Labs drawn to include CBC, reticulocytes, CMP.  Patient given home dose oxycodone , 1 L of fluid, Benadryl  tablet, 2 mg IV Dilaudid .  At end of shift, patient workup is not been completed.  Signed out to oncoming provider [name] [name].  Plan of management discussed.    Final diagnoses:  Sickle cell crisis [facility])    ED Discharge Orders     None          Ruthell Lonni JULIANNA [name] 12/17/23 9371    Trine Likes Moder, MD 12/17/23 [phone]

## 2023-12-17 NOTE — ED Triage Notes (Signed)
 Pt is coming in for a sickle cell pain crisis, has been going on since Saturday, was trying to make it until the pain clinic opened this morning but states he was unable to, pt does not appear to be in any active distress at this time in triage, no mentions of chest pain or shortness of breath.

## 2023-12-17 NOTE — Discharge Instructions (Signed)
 Please follow-up with your pain management clinic for further management of your chronic pain.

## 2023-12-18 ENCOUNTER — Other Ambulatory Visit: Payer: Self-pay | Admitting: Nurse Practitioner

## 2023-12-18 DIAGNOSIS — D571 Sickle-cell disease without crisis: Secondary | ICD-10-CM

## 2023-12-18 NOTE — Telephone Encounter (Unsigned)
 Copied from CRM [id number]. Topic: Clinical - Medication Refill >> Dec 18, 2023 10:00 AM Emylou G wrote: Medication: oxyCODONE  (OXYCONTIN ) 15 mg 12 hr tablet oxyCODONE  (ROXICODONE ) 5 MG immediate release tablet    Has the patient contacted their pharmacy? No (Agent: If no, request that the patient contact the pharmacy for the refill. If patient does not wish to contact the pharmacy document the reason why and proceed with request.) (Agent: If yes, when and what did the pharmacy advise?)  This is the patient's preferred pharmacy:  [facility] [address] [place] [address] AT [address] [place] [address] Phone: [phone] Fax: [phone]    Is this the correct pharmacy for this prescription? Yes If no, delete pharmacy and type the correct one.   Has the prescription been filled recently? Yes  Is the patient out of the medication? Yes  Has the patient been seen for an appointment in the last year OR does the patient have an upcoming appointment? Yes  Can we respond through MyChart? Yes  Agent: Please be advised that Rx refills may take up to 3 business days. We ask that you follow-up with your pharmacy.

## 2023-12-19 NOTE — Telephone Encounter (Signed)
 Please advise [facility]

## 2023-12-20 ENCOUNTER — Other Ambulatory Visit: Payer: Self-pay | Admitting: Nurse Practitioner

## 2023-12-20 DIAGNOSIS — D571 Sickle-cell disease without crisis: Secondary | ICD-10-CM

## 2023-12-24 ENCOUNTER — Other Ambulatory Visit: Payer: Self-pay | Admitting: Internal Medicine

## 2023-12-24 DIAGNOSIS — D571 Sickle-cell disease without crisis: Secondary | ICD-10-CM

## 2023-12-24 MED ORDER — OXYCODONE HCL 5 MG PO TABS: 10.0000 mg | ORAL_TABLET | ORAL | 0 refills | Status: DC | PRN

## 2023-12-24 MED ORDER — OXYCODONE HCL ER 15 MG PO T12A: 15.0000 mg | EXTENDED_RELEASE_TABLET | Freq: Two times a day (BID) | ORAL | 0 refills | Status: DC

## 2023-12-25 ENCOUNTER — Other Ambulatory Visit: Payer: Self-pay

## 2023-12-25 DIAGNOSIS — D571 Sickle-cell disease without crisis: Secondary | ICD-10-CM

## 2023-12-25 MED ORDER — OXYCODONE HCL ER 15 MG PO T12A: 15.0000 mg | EXTENDED_RELEASE_TABLET | Freq: Two times a day (BID) | ORAL | 0 refills | Status: DC

## 2023-12-29 ENCOUNTER — Inpatient Hospital Stay (HOSPITAL_COMMUNITY)
Admission: EM | Admit: 2023-12-29 | Discharge: 2023-12-30 | DRG: 812 | Attending: Internal Medicine | Admitting: Internal Medicine

## 2023-12-29 ENCOUNTER — Encounter (HOSPITAL_COMMUNITY): Payer: Self-pay | Admitting: Internal Medicine

## 2023-12-29 ENCOUNTER — Other Ambulatory Visit: Payer: Self-pay

## 2023-12-29 ENCOUNTER — Emergency Department (HOSPITAL_COMMUNITY)

## 2023-12-29 DIAGNOSIS — D649 Anemia, unspecified: Principal | ICD-10-CM

## 2023-12-29 DIAGNOSIS — Z885 Allergy status to narcotic agent status: Secondary | ICD-10-CM | POA: Diagnosis not present

## 2023-12-29 DIAGNOSIS — Z91199 Patient's noncompliance with other medical treatment and regimen due to unspecified reason: Secondary | ICD-10-CM

## 2023-12-29 DIAGNOSIS — S82001A Unspecified fracture of right patella, initial encounter for closed fracture: Secondary | ICD-10-CM | POA: Diagnosis present

## 2023-12-29 DIAGNOSIS — W1830XA Fall on same level, unspecified, initial encounter: Secondary | ICD-10-CM | POA: Diagnosis present

## 2023-12-29 DIAGNOSIS — D631 Anemia in chronic kidney disease: Secondary | ICD-10-CM | POA: Diagnosis present

## 2023-12-29 DIAGNOSIS — Z803 Family history of malignant neoplasm of breast: Secondary | ICD-10-CM

## 2023-12-29 DIAGNOSIS — N184 Chronic kidney disease, stage 4 (severe): Secondary | ICD-10-CM | POA: Diagnosis present

## 2023-12-29 DIAGNOSIS — K219 Gastro-esophageal reflux disease without esophagitis: Secondary | ICD-10-CM | POA: Diagnosis present

## 2023-12-29 DIAGNOSIS — Z5329 Procedure and treatment not carried out because of patient's decision for other reasons: Secondary | ICD-10-CM | POA: Diagnosis present

## 2023-12-29 DIAGNOSIS — Z79899 Other long term (current) drug therapy: Secondary | ICD-10-CM | POA: Diagnosis not present

## 2023-12-29 DIAGNOSIS — Z9081 Acquired absence of spleen: Secondary | ICD-10-CM

## 2023-12-29 DIAGNOSIS — G894 Chronic pain syndrome: Secondary | ICD-10-CM | POA: Diagnosis present

## 2023-12-29 DIAGNOSIS — Z886 Allergy status to analgesic agent status: Secondary | ICD-10-CM | POA: Diagnosis not present

## 2023-12-29 DIAGNOSIS — D5700 Hb-SS disease with crisis, unspecified: Principal | ICD-10-CM | POA: Diagnosis present

## 2023-12-29 DIAGNOSIS — Y92008 Other place in unspecified non-institutional (private) residence as the place of occurrence of the external cause: Secondary | ICD-10-CM | POA: Diagnosis not present

## 2023-12-29 DIAGNOSIS — E875 Hyperkalemia: Secondary | ICD-10-CM | POA: Diagnosis present

## 2023-12-29 DIAGNOSIS — Z9049 Acquired absence of other specified parts of digestive tract: Secondary | ICD-10-CM

## 2023-12-29 DIAGNOSIS — F1910 Other psychoactive substance abuse, uncomplicated: Secondary | ICD-10-CM | POA: Diagnosis present

## 2023-12-29 DIAGNOSIS — Z87891 Personal history of nicotine dependence: Secondary | ICD-10-CM

## 2023-12-29 DIAGNOSIS — F17200 Nicotine dependence, unspecified, uncomplicated: Secondary | ICD-10-CM | POA: Diagnosis present

## 2023-12-29 LAB — CBC
HCT: 18.1 % — ABNORMAL LOW (ref 39.0–52.0)
Hemoglobin: 6.1 g/dL — CL (ref 13.0–17.0)
MCH: 42.1 pg — ABNORMAL HIGH (ref 26.0–34.0)
MCHC: 33.7 g/dL (ref 30.0–36.0)
MCV: 124.8 fL — ABNORMAL HIGH (ref 80.0–100.0)
Platelets: 146 K/uL — ABNORMAL LOW (ref 150–400)
RBC: 1.45 MIL/uL — ABNORMAL LOW (ref 4.22–5.81)
WBC: 7.1 K/uL (ref 4.0–10.5)
nRBC: 0.3 % — ABNORMAL HIGH (ref 0.0–0.2)

## 2023-12-29 LAB — CBC WITH DIFFERENTIAL/PLATELET
Abs Immature Granulocytes: 0.01 K/uL (ref 0.00–0.07)
Basophils Absolute: 0 K/uL (ref 0.0–0.1)
Basophils Relative: 0 %
Eosinophils Absolute: 0.1 K/uL (ref 0.0–0.5)
Eosinophils Relative: 1 %
HCT: 18.2 % — ABNORMAL LOW (ref 39.0–52.0)
Hemoglobin: 6.3 g/dL — CL (ref 13.0–17.0)
Immature Granulocytes: 0 %
Lymphocytes Relative: 33 %
Lymphs Abs: 2.8 K/uL (ref 0.7–4.0)
MCH: 42.3 pg — ABNORMAL HIGH (ref 26.0–34.0)
MCHC: 34.6 g/dL (ref 30.0–36.0)
MCV: 122.1 fL — ABNORMAL HIGH (ref 80.0–100.0)
Monocytes Absolute: 0.5 K/uL (ref 0.1–1.0)
Monocytes Relative: 5 %
Neutro Abs: 5.2 K/uL (ref 1.7–7.7)
Neutrophils Relative %: 61 %
Platelets: 146 K/uL — ABNORMAL LOW (ref 150–400)
RBC: 1.49 MIL/uL — ABNORMAL LOW (ref 4.22–5.81)
WBC: 8.5 K/uL (ref 4.0–10.5)
nRBC: 0.2 % (ref 0.0–0.2)

## 2023-12-29 LAB — CREATININE, SERUM
Creatinine, Ser: 3.54 mg/dL — ABNORMAL HIGH (ref 0.61–1.24)
GFR, Estimated: 21 mL/min — ABNORMAL LOW (ref >60)

## 2023-12-29 LAB — BASIC METABOLIC PANEL WITH GFR
Anion gap: 11 (ref 5–15)
BUN: 71 mg/dL — ABNORMAL HIGH (ref 6–20)
CO2: 17 mmol/L — ABNORMAL LOW (ref 22–32)
Calcium: 9.1 mg/dL (ref 8.9–10.3)
Chloride: 106 mmol/L (ref 98–111)
Creatinine, Ser: 3.96 mg/dL — ABNORMAL HIGH (ref 0.61–1.24)
GFR, Estimated: 19 mL/min — ABNORMAL LOW (ref >60)
Glucose, Bld: 110 mg/dL — ABNORMAL HIGH (ref 70–99)
Potassium: 5.2 mmol/L — ABNORMAL HIGH (ref 3.5–5.1)
Sodium: 134 mmol/L — ABNORMAL LOW (ref 135–145)

## 2023-12-29 LAB — RETICULOCYTES
Immature Retic Fract: 20.5 % — ABNORMAL HIGH (ref 2.3–15.9)
RBC.: 1.49 MIL/uL — ABNORMAL LOW (ref 4.22–5.81)
Retic Count, Absolute: 51.3 K/uL (ref 19.0–186.0)
Retic Ct Pct: 3.4 % — ABNORMAL HIGH (ref 0.4–3.1)

## 2023-12-29 MED ORDER — DEFERIPRONE (TWICE DAILY) 1000 MG PO TABS: 3000.0000 mg | ORAL_TABLET | Freq: Every day | ORAL | Status: DC

## 2023-12-29 MED ORDER — NALOXONE HCL 0.4 MG/ML IJ SOLN: 0.4000 mg | INTRAMUSCULAR | Status: DC | PRN

## 2023-12-29 MED ORDER — HYDROMORPHONE HCL 1 MG/ML IJ SOLN
2.0000 mg | Freq: Once | INTRAMUSCULAR | Status: AC
  Administered 2023-12-29: 2 mg via INTRAVENOUS
  Filled 2023-12-29: qty 2

## 2023-12-29 MED ORDER — HYDROMORPHONE 1 MG/ML IV SOLN
INTRAVENOUS | Status: DC
  Administered 2023-12-29: 2 mg via INTRAVENOUS
  Administered 2023-12-29: 30 mg via INTRAVENOUS
  Administered 2023-12-29 – 2023-12-30 (×2): 1.5 mg via INTRAVENOUS
  Administered 2023-12-30: 0.5 mg via INTRAVENOUS
  Administered 2023-12-30: 5.4 mg via INTRAVENOUS
  Administered 2023-12-30: 2 mg via INTRAVENOUS
  Filled 2023-12-29: qty 30

## 2023-12-29 MED ORDER — SODIUM BICARBONATE 650 MG PO TABS
1950.0000 mg | ORAL_TABLET | Freq: Three times a day (TID) | ORAL | Status: DC
  Administered 2023-12-29 – 2023-12-30 (×3): 1950 mg via ORAL
  Filled 2023-12-29 (×3): qty 3

## 2023-12-29 MED ORDER — SODIUM ZIRCONIUM CYCLOSILICATE 10 G PO PACK
10.0000 g | PACK | Freq: Every day | ORAL | Status: DC
  Administered 2023-12-30: 10 g via ORAL
  Filled 2023-12-29: qty 1

## 2023-12-29 MED ORDER — ORAL CARE MOUTH RINSE
15.0000 mL | OROMUCOSAL | Status: DC | PRN
Start: 2023-12-29 — End: 2023-12-30

## 2023-12-29 MED ORDER — AMITRIPTYLINE HCL 25 MG PO TABS
100.0000 mg | ORAL_TABLET | Freq: Every day | ORAL | Status: DC
  Administered 2023-12-29: 100 mg via ORAL
  Filled 2023-12-29: qty 4

## 2023-12-29 MED ORDER — HEPARIN SODIUM (PORCINE) 5000 UNIT/ML IJ SOLN
5000.0000 [IU] | Freq: Three times a day (TID) | INTRAMUSCULAR | Status: DC
  Administered 2023-12-29 – 2023-12-30 (×2): 5000 [IU] via SUBCUTANEOUS
  Filled 2023-12-29 (×3): qty 1

## 2023-12-29 MED ORDER — SODIUM CHLORIDE 0.9 % IV BOLUS
500.0000 mL | Freq: Once | INTRAVENOUS | Status: AC
  Administered 2023-12-29: 500 mL via INTRAVENOUS

## 2023-12-29 MED ORDER — ONDANSETRON HCL 4 MG/2ML IJ SOLN
4.0000 mg | Freq: Four times a day (QID) | INTRAMUSCULAR | Status: DC | PRN
  Administered 2023-12-29: 4 mg via INTRAVENOUS
  Filled 2023-12-29: qty 2

## 2023-12-29 MED ORDER — OXYCODONE HCL ER 15 MG PO T12A
15.0000 mg | EXTENDED_RELEASE_TABLET | Freq: Two times a day (BID) | ORAL | Status: DC
  Administered 2023-12-29 – 2023-12-30 (×3): 15 mg via ORAL
  Filled 2023-12-29 (×3): qty 1

## 2023-12-29 MED ORDER — SENNOSIDES-DOCUSATE SODIUM 8.6-50 MG PO TABS
1.0000 | ORAL_TABLET | Freq: Two times a day (BID) | ORAL | Status: DC
  Administered 2023-12-30: 1 via ORAL
  Filled 2023-12-29 (×2): qty 1

## 2023-12-29 MED ORDER — POLYETHYLENE GLYCOL 3350 17 G PO PACK: 17.0000 g | PACK | Freq: Every day | ORAL | Status: DC | PRN

## 2023-12-29 MED ORDER — SODIUM CHLORIDE 0.9% FLUSH: 9.0000 mL | INTRAVENOUS | Status: DC | PRN

## 2023-12-29 MED ORDER — CALCITRIOL 0.25 MCG PO CAPS
0.2500 ug | ORAL_CAPSULE | Freq: Every day | ORAL | Status: DC
  Administered 2023-12-30: 0.25 ug via ORAL
  Filled 2023-12-29: qty 1

## 2023-12-29 MED ORDER — FOLIC ACID 1 MG PO TABS
1.0000 mg | ORAL_TABLET | Freq: Every day | ORAL | Status: DC
  Administered 2023-12-29 – 2023-12-30 (×2): 1 mg via ORAL
  Filled 2023-12-29 (×2): qty 1

## 2023-12-29 MED ORDER — DIPHENHYDRAMINE HCL 25 MG PO CAPS
25.0000 mg | ORAL_CAPSULE | ORAL | Status: DC | PRN
  Administered 2023-12-29: 25 mg via ORAL
  Filled 2023-12-29: qty 1

## 2023-12-29 MED ORDER — DIPHENHYDRAMINE HCL 25 MG PO CAPS
25.0000 mg | ORAL_CAPSULE | Freq: Once | ORAL | Status: AC
  Administered 2023-12-29: 25 mg via ORAL
  Filled 2023-12-29: qty 1

## 2023-12-29 NOTE — H&P (Signed)
 History and Physical    Patient: [name] [name]. FMW:[id number] DOB: Feb 19, 1980 DOA: 12/29/2023 DOS: the patient was seen and examined on 12/29/2023 PCP: [name], NP  Patient coming from: Home  Chief Complaint:  Chief Complaint  Patient presents with   Sickle Cell Pain Crisis   Knee Pain   HPI: [name] [name]. is a 43 y.o. male with medical history significant of sickle cell disease, chronic pain syndrome, anemia of chronic disease, chronic kidney disease stage IV, recurrent hyperkalemia, tobacco abuse, who presented to the ER with pain in his right knee and leg as well as generalized pain.  Patient apparently had a fall in his driveway yesterday.  He felt dizzy and lightheaded and the floor was slippery.  He has had recurrent history of lightheadedness and vertigo for a long time.  He has had difficulty walking after falling on his knees.  This has triggered pain all over his body rated as 9 out of 10.  Patient is seen and evaluated in the ER.  He received initial treatment with up to 6 mg of Dilaudid  IV but still having pain.  He is x-ray showed right avulsion fracture around the knee.  This has been reduced.  Patient is being admitted for further management of his sickle cell crisis and pain management.   Review of Systems: As mentioned in the history of present illness. All other systems reviewed and are negative. Past Medical History:  Diagnosis Date   Abscess of right iliac fossa 09/24/2018   required Perc Drain 09/24/2018   Arachnoid Cyst of brain bilaterally    2 really small ones in the back of my head; inside; saw them w/MRI (09/25/2012)   Bacterial pneumonia ~ 2012   caught it here in the hospital (09/25/2012)   Chronic kidney disease    from my sickle cell (09/25/2012)   CKD (chronic kidney disease), stage II    Colitis 04/19/2017   CT scan abd/ pelvis   GERD (gastroesophageal reflux disease)    after I eat alot of spicey foods (09/25/2012)    Gynecomastia, male 07/10/2012   History of blood transfusion    always related to sickle cell crisis (09/25/2012)   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   Noted in noted from Hematology notes at [facility]   Migraines    take RX qd to prevent them (09/25/2012)   Opioid dependence with withdrawal (HCC) 08/30/2012   Renal insufficiency    Sickle cell anemia (HCC)    Sickle cell crisis (HCC) 09/25/2012   Sickle cell nephropathy (HCC) 07/10/2012   Sinus tachycardia    Tachycardia with heart rate 121-140 beats per minute with ambulation 08/04/2016   Past Surgical History:  Procedure Laterality Date   ABCESS DRAINAGE     CHOLECYSTECTOMY  ~ 2012   COLONOSCOPY N/A 04/23/2017   Procedure: COLONOSCOPY;  Surgeon: [name], MD;  Location: [facility] ENDOSCOPY;  Service: Endoscopy;  Laterality: N/A;   IR FLUORO GUIDE CV LINE RIGHT  12/17/2016   IR FLUORO GUIDE CV LINE RIGHT  02/07/2021   IR RADIOLOGIST EVAL & MGMT  10/02/2018   IR RADIOLOGIST EVAL & MGMT  10/15/2018   IR REMOVAL TUN CV CATH W/O FL  12/21/2016   IR REMOVAL TUN CV CATH W/O FL  02/11/2021   IR US  GUIDE VASC ACCESS RIGHT  12/17/2016   IR US  GUIDE VASC ACCESS RIGHT  02/07/2021   spleenectomy     Social History:  reports that he  has quit smoking. His smoking use included cigarettes. He has never used smokeless tobacco. He reports current alcohol  use. He reports current drug use. Drug: Marijuana.  Allergies  Allergen Reactions   Nsaids Other (See Comments)    Kidney disease   Morphine  Other (See Comments)    -Really bad headaches    Family History  Problem Relation Age of Onset   Breast cancer Mother     Prior to Admission medications   Medication Sig Start Date End Date Taking? Authorizing Provider  amitriptyline  (ELAVIL ) 50 MG tablet Take 100 mg by mouth at bedtime. 09/20/23   [provider]  calcitRIOL  (ROCALTROL ) 0.25 MCG capsule Take 0.25 mcg by mouth daily with breakfast. 11/16/20   [provider]   Deferiprone , Twice Daily, (FERRIPROX  TWICE-A-DAY) 1000 MG TABS Take 3,000 mg by mouth daily. 05/01/22   Tilford Bertram HERO, FNP  folic acid  (FOLVITE ) 1 MG tablet TAKE 1 TABLET BY MOUTH EVERY DAY 03/22/20   Hollis, Lachina M, FNP  hydroxyurea  (HYDREA ) 500 MG capsule Take 1 capsule by mouth in the morning on Sun/Tues/Wed/Thurs/Sat and 2 capsules on Mon/Fri 01/25/23   Nichols, [name], NP  oxyCODONE  (OXYCONTIN ) 15 mg 12 hr tablet Take 1 tablet (15 mg total) by mouth every 12 (twelve) hours. 12/25/23 01/24/24  Cherylene Homer HERO, NP  oxyCODONE  (ROXICODONE ) 5 MG immediate release tablet Take 2 tablets (10 mg total) by mouth every 4 (four) hours as needed for up to 15 days for severe pain (pain score 7-10). 12/24/23 01/08/24  [name], [name], MD  sodium bicarbonate  650 MG tablet Take 1,950 mg by mouth 3 (three) times daily. 11/07/18   [provider]  sodium zirconium cyclosilicate  (LOKELMA ) 10 g PACK packet Take 10 g by mouth daily. Patient not taking: Reported on 12/04/2023 04/21/23 04/22/24  [provider]  triamcinolone cream (KENALOG) 0.1 % Apply 1 Application topically 2 (two) times daily. 12/04/23   Paseda, Folashade R, FNP  TYLENOL  8 HOUR ARTHRITIS PAIN 650 MG CR tablet Take 1,300 mg by mouth as needed for pain.    [provider]    Physical Exam: Vitals:   12/29/23 0741 12/29/23 1057  BP: 123/79 117/85  Pulse: (!) 125 (!) 105  Resp: 18 16  Temp: 97.8 F (36.6 C) 98.4 F (36.9 C)  TempSrc: Oral Oral  SpO2: 98% 97%   Constitutional: NAD, calm, comfortable Eyes: PERRL, lids and conjunctivae normal ENMT: Mucous membranes are moist. Posterior pharynx clear of any exudate or lesions.Normal dentition.  Neck: normal, supple, no masses, no thyromegaly Respiratory: clear to auscultation bilaterally, no wheezing, no crackles. Normal respiratory effort. No accessory muscle use.  Cardiovascular: Sinus tachycardia, no murmurs / rubs / gallops. No extremity edema. 2+ pedal  pulses. No carotid bruits.  Abdomen: no tenderness, no masses palpated. No hepatosplenomegaly. Bowel sounds positive.  Musculoskeletal: Right knee is immobilized.  Tender and mildly swollen especially laterally.  Skin: no rashes, lesions, ulcers. No induration Neurologic: CN 2-12 grossly intact. Sensation intact, DTR normal. Strength 5/5 in all 4.  Psychiatric: Normal judgment and insight. Alert and oriented x 3. Normal mood  Data Reviewed:  Temperature 98.4, heart rate 125, oxygen  sat 97% room air.  White count 7.1 hemoglobin 6.1, platelets 146.  Sodium 134 CO2 of 17 BUN 71 potassium 5.2 creatinine 3.54.  Glucose is 110.  Assessment and Plan:  #1 sickle cell pain crisis: Triggered by his recent fall and injury to the right knee.  Patient will be admitted.  Initiate Dilaudid  PCA.  Patient does not receive Toradol  due to chronic kidney disease.  We will manage his pain with oral medication as well.  Gentle hydration to avoid fluid overload due to renal function.  #2 anemia of chronic disease: Hemoglobin is down to 6.3.  Will assess.  If it drops below 6 g we will transfuse.  He has history of iron overload so we are careful.  Typically transfuse when hemoglobin is below 6 g.  3 acute on chronic kidney disease stage IV: Creatinine has gone from baseline of around 3-3.96.  Hydrate and monitor.  #4 hyperkalemia: Patient is possibly on Lokelma .  He has not been compliant.  Resume Lokelma  in the hospital  #5 chronic pain syndrome: Continue chronic home regimen  #6 tobacco abuse: Counseling provided    Advance Care Planning:   Code Status: Full Code   Consults: None  Family Communication: No family at bedside  Severity of Illness: The appropriate patient status for this patient is INPATIENT. Inpatient status is judged to be reasonable and necessary in order to provide the required intensity of service to ensure the patient's safety. The patient's presenting symptoms, physical exam findings,  and initial radiographic and laboratory data in the context of their chronic comorbidities is felt to place them at high risk for further clinical deterioration. Furthermore, it is not anticipated that the patient will be medically stable for discharge from the hospital within 2 midnights of admission.   * I certify that at the point of admission it is my clinical judgment that the patient will require inpatient hospital care spanning beyond 2 midnights from the point of admission due to high intensity of service, high risk for further deterioration and high frequency of surveillance required.*  AuthorBETHA SIM KNOLL, MD 12/29/2023 12:15 PM  For on call review [email or web address].

## 2023-12-29 NOTE — ED Notes (Signed)
 US  PIV placed, L upper arm, 20g 1.88

## 2023-12-29 NOTE — ED Notes (Signed)
 Writer sent note to Dr. Laveta him know pt advised his pain has not improved.

## 2023-12-29 NOTE — Progress Notes (Signed)
 Orthopedic Tech Progress Note Patient Details:  [name] [name]. [birth year] [id number]  Ortho Devices Type of Ortho Device: Knee Immobilizer Ortho Device/Splint Location: RLE/ patella fx Ortho Device/Splint Interventions: Ordered, Application, Adjustment   Post Interventions Patient Tolerated: Well Instructions Provided: Care of device, Adjustment of device  Adine MARLA Blush 12/29/2023, 11:54 AM

## 2023-12-29 NOTE — ED Notes (Signed)
 Ultrasound IV team consulted due to pt being difficult stick

## 2023-12-29 NOTE — ED Provider Notes (Signed)
 [place] EMERGENCY DEPARTMENT AT [facility] Provider Note   CSN: [id number] Arrival date & time: 12/29/23  9266     Patient presents with: Sickle Cell Pain Crisis and Knee Pain   [name] [name]. is a 43 y.o. male with history of sickle cell disease presenting to ED with complaint of fall yesterday, dizziness and lightheadedness.  Patient reports he suffers from periodic episodes of both vertigo and lightheadedness that been going on for a long time.  He feels this may be related to a medication he takes, though cannot recall the exact name.  He said he was trying to get leaves with a drive with his mother yesterday and he fell and landed on both knees.  They have been extremely sore and he is having difficulty walking due to pain in his knees now.  He denies any new or worsening pain in his hips.  Denies head injury or loss of consciousness.  But he has felt dizzy and lightheaded today.  He is concerned that he may be anemic or have problems with his potassium.  He says his pain is quite significant.  I did review his medical records including his hematology evaluation from earlier this month, or he is noted to have a problems with iron overload, and advised not to receive blood transfusion unless his hemoglobin is under 6.  {Add pertinent medical, surgical, social history, OB history to HPI:32947} HPI     Prior to Admission medications   Medication Sig Start Date End Date Taking? Authorizing Provider  amitriptyline  (ELAVIL ) 50 MG tablet Take 100 mg by mouth at bedtime. 09/20/23   [provider]  calcitRIOL  (ROCALTROL ) 0.25 MCG capsule Take 0.25 mcg by mouth daily with breakfast. 11/16/20   [provider]  Deferiprone , Twice Daily, (FERRIPROX  TWICE-A-DAY) 1000 MG TABS Take 3,000 mg by mouth daily. 05/01/22   Tilford Bertram HERO, FNP  folic acid  (FOLVITE ) 1 MG tablet TAKE 1 TABLET BY MOUTH EVERY DAY 03/22/20   Hollis, Lachina M, FNP  hydroxyurea  (HYDREA ) 500 MG  capsule Take 1 capsule by mouth in the morning on Sun/Tues/Wed/Thurs/Sat and 2 capsules on Mon/Fri 01/25/23   Nichols, [name], NP  oxyCODONE  (OXYCONTIN ) 15 mg 12 hr tablet Take 1 tablet (15 mg total) by mouth every 12 (twelve) hours. 12/25/23 01/24/24  Cherylene Homer HERO, NP  oxyCODONE  (ROXICODONE ) 5 MG immediate release tablet Take 2 tablets (10 mg total) by mouth every 4 (four) hours as needed for up to 15 days for severe pain (pain score 7-10). 12/24/23 01/08/24  [name], [name], MD  sodium bicarbonate  650 MG tablet Take 1,950 mg by mouth 3 (three) times daily. 11/07/18   [provider]  sodium zirconium cyclosilicate  (LOKELMA ) 10 g PACK packet Take 10 g by mouth daily. Patient not taking: Reported on 12/04/2023 04/21/23 04/22/24  [provider]  triamcinolone cream (KENALOG) 0.1 % Apply 1 Application topically 2 (two) times daily. 12/04/23   Paseda, Folashade R, FNP  TYLENOL  8 HOUR ARTHRITIS PAIN 650 MG CR tablet Take 1,300 mg by mouth as needed for pain.    [provider]    Allergies: Nsaids and Morphine     Review of Systems  Updated Vital Signs BP 123/79 (BP Location: Left Arm)   Pulse (!) 125   Temp 97.8 F (36.6 C) (Oral)   Resp 18   SpO2 98%   Physical Exam Constitutional:      General: He is not in acute distress. HENT:  Head: Normocephalic and atraumatic.  Eyes:     Conjunctiva/sclera: Conjunctivae normal.     Pupils: Pupils are equal, round, and reactive to light.  Cardiovascular:     Rate and Rhythm: Normal rate and regular rhythm.  Pulmonary:     Effort: Pulmonary effort is normal. No respiratory distress.  Abdominal:     General: There is no distension.     Tenderness: There is no abdominal tenderness.  Musculoskeletal:     Comments: Joint effusion with tenderness of the right peripatellar region, more significant than the left, patient is able to perform range of motion testing but has some difficulty due to pain.  Full range of  motion of the hips with limited pain.  Skin:    General: Skin is warm and dry.  Neurological:     General: No focal deficit present.     Mental Status: He is alert. Mental status is at baseline.  Psychiatric:        Mood and Affect: Mood normal.        Behavior: Behavior normal.     (all labs ordered are listed, but only abnormal results are displayed) Labs Reviewed  RETICULOCYTES  CBC WITH DIFFERENTIAL/PLATELET  BASIC METABOLIC PANEL WITH GFR    EKG: None  Radiology: No results found.  {Document cardiac monitor, telemetry assessment procedure when appropriate:32947} Procedures   Medications Ordered in the ED  HYDROmorphone  (DILAUDID ) injection 2 mg (has no administration in time range)  diphenhydrAMINE  (BENADRYL ) capsule 25 mg (has no administration in time range)  sodium chloride  0.9 % bolus 500 mL (has no administration in time range)      {Click here for ABCD2, HEART and other calculators REFRESH Note before signing:1}                              Medical Decision Making Amount and/or Complexity of Data Reviewed Labs: ordered. Radiology: ordered. ECG/medicine tests: ordered.  Risk Prescription drug management.   Patient is here with complaint of primarily joint pain in the lower legs as well as dizziness and lightheadedness.  The joint pain may be related to his trauma from his fall and injury.  He does report however he is having some sickle cell pain in his legs and arms, which he thinks flared up after his fall.  We will check his sickle cell labs, including his hemoglobin.  I did review external records including his hematology evaluation in the office earlier this month.  I have a lower suspicion for acute PE, ACS, acute chest syndrome.  The patient is not febrile or hypoxic and does not have chest pain.  He does report some lightheadedness but clarifies that this has been going on for a long time.  And is episodic.  It sounds like possibly  vertigo.  {Document critical care time when appropriate  Document review of labs and clinical decision tools ie CHADS2VASC2, etc  Document your independent review of radiology images and any outside records  Document your discussion with family members, caretakers and with consultants  Document social determinants of health affecting pt's care  Document your decision making why or why not admission, treatments were needed:32947:::1}   Final diagnoses:  None    ED Discharge Orders     None

## 2023-12-29 NOTE — ED Notes (Signed)
 Writer called ortho tech to apply knee immobilizer.

## 2023-12-29 NOTE — ED Triage Notes (Signed)
 Pt reports 3 falls yesterday onto legs and butt bone, been having dizzy spells so wants to get his hemoglobin and potassium checked. No head trauma during falls

## 2023-12-30 DIAGNOSIS — D5700 Hb-SS disease with crisis, unspecified: Secondary | ICD-10-CM | POA: Diagnosis not present

## 2023-12-30 LAB — COMPREHENSIVE METABOLIC PANEL WITH GFR
ALT: 27 U/L (ref 0–44)
AST: 45 U/L — ABNORMAL HIGH (ref 15–41)
Albumin: 3.4 g/dL — ABNORMAL LOW (ref 3.5–5.0)
Alkaline Phosphatase: 195 U/L — ABNORMAL HIGH (ref 38–126)
Anion gap: 10 (ref 5–15)
BUN: 59 mg/dL — ABNORMAL HIGH (ref 6–20)
CO2: 19 mmol/L — ABNORMAL LOW (ref 22–32)
Calcium: 8.9 mg/dL (ref 8.9–10.3)
Chloride: 106 mmol/L (ref 98–111)
Creatinine, Ser: 3.16 mg/dL — ABNORMAL HIGH (ref 0.61–1.24)
GFR, Estimated: 25 mL/min — ABNORMAL LOW (ref >60)
Glucose, Bld: 102 mg/dL — ABNORMAL HIGH (ref 70–99)
Potassium: 4.6 mmol/L (ref 3.5–5.1)
Sodium: 135 mmol/L (ref 135–145)
Total Bilirubin: 1 mg/dL (ref 0.0–1.2)
Total Protein: 6.6 g/dL (ref 6.5–8.1)

## 2023-12-30 LAB — CBC WITH DIFFERENTIAL/PLATELET
Abs Immature Granulocytes: 0.01 K/uL (ref 0.00–0.07)
Basophils Absolute: 0 K/uL (ref 0.0–0.1)
Basophils Relative: 0 %
Eosinophils Absolute: 0.1 K/uL (ref 0.0–0.5)
Eosinophils Relative: 1 %
HCT: 16 % — ABNORMAL LOW (ref 39.0–52.0)
Hemoglobin: 5.6 g/dL — CL (ref 13.0–17.0)
Immature Granulocytes: 0 %
Lymphocytes Relative: 58 %
Lymphs Abs: 4.4 K/uL — ABNORMAL HIGH (ref 0.7–4.0)
MCH: 43.4 pg — ABNORMAL HIGH (ref 26.0–34.0)
MCHC: 35 g/dL (ref 30.0–36.0)
MCV: 124 fL — ABNORMAL HIGH (ref 80.0–100.0)
Monocytes Absolute: 0.4 K/uL (ref 0.1–1.0)
Monocytes Relative: 5 %
Neutro Abs: 2.7 K/uL (ref 1.7–7.7)
Neutrophils Relative %: 36 %
Platelets: 153 K/uL (ref 150–400)
RBC: 1.29 MIL/uL — ABNORMAL LOW (ref 4.22–5.81)
Smear Review: NORMAL
WBC: 7.5 K/uL (ref 4.0–10.5)
nRBC: 0.5 % — ABNORMAL HIGH (ref 0.0–0.2)

## 2023-12-30 LAB — HEMOGLOBIN AND HEMATOCRIT, BLOOD
HCT: 21.5 % — ABNORMAL LOW (ref 39.0–52.0)
Hemoglobin: 7.5 g/dL — ABNORMAL LOW (ref 13.0–17.0)

## 2023-12-30 LAB — PREPARE RBC (CROSSMATCH)

## 2023-12-30 MED ORDER — SODIUM CHLORIDE 0.9% IV SOLUTION
Freq: Once | INTRAVENOUS | Status: AC
Start: 2023-12-30 — End: 2023-12-30

## 2023-12-30 NOTE — Progress Notes (Incomplete)
 SICKLE CELL SERVICE PROGRESS NOTE  [name] [name]. FMW:[id number] DOB: [birth year] DOA: 12/29/2023 PCP: [name] Bascom RAMAN, NP  Assessment/Plan: Principal Problem:   Sickle cell disease with crisis (HCC) Active Problems:   Hyperkalemia   CKD (chronic kidney disease), stage IV (HCC)   TOBACCO ABUSE   Polysubstance abuse (HCC)   GERD (gastroesophageal reflux disease)   Anemia due to chronic kidney disease  ***  Code Status: *** Family Communication: *** Disposition Plan: [name] [name]  Pager [phone] If 7PM-7AM, please contact night-coverage.  12/30/2023, 1:48 PM  LOS: 1 day   Brief narrative: ***  Consultants: ***  Procedures: ***  Antibiotics: ***  HPI/Subjective: ***  Objective: Vitals:   12/30/23 0745 12/30/23 0954 12/30/23 1114 12/30/23 1129  BP:  100/67 92/60 90/63   Pulse:   96 98  Resp: 16 16  16   Temp:  98.2 F (36.8 C) 98.4 F (36.9 C) 98.9 F (37.2 C)  TempSrc:  Oral Oral Oral  SpO2:  98% 100% 98%  Weight:      Height:       Weight change:   Intake/Output Summary (Last 24 hours) at 12/30/2023 1348 Last data filed at 12/30/2023 1114 Gross per 24 hour  Intake 525.5 ml  Output 950 ml  Net -424.5 ml    General: Alert, awake, oriented x3, in no acute distress.  HEENT: [place]/AT PEERL, EOMI Neck: Trachea midline,  no masses, no thyromegal,y no JVD, no carotid bruit OROPHARYNX:  Moist, No exudate/ erythema/lesions.  Heart: Regular rate and rhythm, without murmurs, rubs, gallops, PMI non-displaced, no heaves or thrills on palpation.  Lungs: Clear to auscultation, no wheezing or rhonchi noted. No increased vocal fremitus resonant to percussion  Abdomen: Soft, nontender, nondistended, positive bowel sounds, no masses no hepatosplenomegaly noted..  Neuro: No focal neurological deficits noted cranial nerves II through XII grossly intact. DTRs 2+ bilaterally upper and lower extremities. Strength 5 out of 5 in bilateral upper and lower  extremities. Musculoskeletal: No warm swelling or erythema around joints, no spinal tenderness noted. Psychiatric: Patient alert and oriented x3, good insight and cognition, good recent to remote recall. Lymph node survey: No cervical axillary or inguinal lymphadenopathy noted.   Data Reviewed: Basic Metabolic Panel: Recent Labs  Lab 12/29/23 0855 12/29/23 1346 12/30/23 0658  NA 134*  --  135  K 5.2*  --  4.6  CL 106  --  106  CO2 17*  --  19*  GLUCOSE 110*  --  102*  BUN 71*  --  59*  CREATININE 3.96* 3.54* 3.16*  CALCIUM  9.1  --  8.9   Liver Function Tests: Recent Labs  Lab 12/30/23 0658  AST 45*  ALT 27  ALKPHOS 195*  BILITOT 1.0  PROT 6.6  ALBUMIN  3.4*   No results for input(s): LIPASE, AMYLASE in the last 168 hours. No results for input(s): AMMONIA in the last 168 hours. CBC: Recent Labs  Lab 12/29/23 0855 12/29/23 1346 12/30/23 0658  WBC 8.5 7.1 7.5  NEUTROABS 5.2  --  2.7  HGB 6.3* 6.1* 5.6*  HCT 18.2* 18.1* 16.0*  MCV 122.1* 124.8* 124.0*  PLT 146* 146* 153   Cardiac Enzymes: No results for input(s): CKTOTAL, CKMB, CKMBINDEX, TROPONINI in the last 168 hours. BNP (last 3 results) No results for input(s): BNP in the last 8760 hours.  ProBNP (last 3 results) No results for input(s): PROBNP in the last 8760 hours.  CBG: No results for input(s): GLUCAP in the last 168 hours.  No results found for this or any previous visit (from the past 240 hours).   Studies: CT Knee Right Wo Contrast Result Date: 12/29/2023 CLINICAL DATA:  Multiple recent falls. Knee trauma, occult fracture suspected. EXAM: CT OF THE RIGHT KNEE WITHOUT CONTRAST TECHNIQUE: Multidetector CT imaging of the right knee was performed according to the standard protocol. Multiplanar CT image reconstructions were also generated. RADIATION DOSE REDUCTION: This exam was performed according to the departmental dose-optimization program which includes automated exposure  control, adjustment of the mA and/or kV according to patient size and/or use of iterative reconstruction technique. COMPARISON:  Radiographs 12/29/2023 and 10/02/2023. CT of the right knee 08/06/2023. FINDINGS: Bones/Joint/Cartilage Status post right total knee arthroplasty. The hardware is intact without loosening. There is no evidence of periprosthetic fracture. As suggested on the earlier radiographs, there is new irregularity of the inferior pole of the patella, suspicious for nondisplaced avulsion fracture. Unchanged peripherally sclerotic lucent lesion in the fibular head, possibly a chronic infarct. A moderate-sized complex knee joint effusion appears similar to the previous CT. Ligaments Suboptimally assessed by CT. Muscles and Tendons The quadriceps and patellar tendons are chronically thickened, but intact. No focal muscular abnormalities are identified. Soft tissues Partially obscured by artifact from the arthroplasty. No focal abnormality identified. IMPRESSION: 1. New irregularity of the inferior pole of the patella, suspicious for nondisplaced avulsion fracture. Correlate with point tenderness. 2. No evidence of periprosthetic fracture or loosening of the right total knee arthroplasty. 3. Moderate-sized complex knee joint effusion, similar to previous CT. Electronically Signed   By: [name] [name] M.D.   On: 12/29/2023 10:55   DG Knee Complete 4 Views Left Result Date: 12/29/2023 CLINICAL DATA:  Trauma last night due to fall with left knee pain. EXAM: LEFT KNEE - COMPLETE 4+ VIEW COMPARISON:  08/06/2023, 05/04/2021 and 05/23/2019 FINDINGS: Chronic mildly progressive heterogeneous sclerotic changes over the distal femoral condyle and proximal tibial metaphysis compatible with bone infarcts. Minimal osteoarthritic change of the left knee. No acute fracture or dislocation. No significant joint effusion. IMPRESSION: 1. No acute findings. 2. Chronic mildly progressive bone infarcts over the distal  femoral condyle and proximal tibial metaphysis. Electronically Signed   By: [name] [name] M.D.   On: 12/29/2023 09:20   DG Knee Complete 4 Views Right Result Date: 12/29/2023 CLINICAL DATA:  Fall last night with right knee pain. EXAM: RIGHT KNEE - COMPLETE 4+ VIEW COMPARISON:  10/02/2023 FINDINGS: Evidence of previous right total knee arthroplasty with prosthetic components intact and normally located. Lucency through the inferior tip of the patella on the lateral film not seen previously as this may be due to an acute fracture. No significant joint effusion. Remainder of the exam is unchanged. IMPRESSION: Lucency through the inferior tip of the patella on the lateral film not seen previously as this may be due to an acute fracture. Recommend clinical correlation. Electronically Signed   By: [name] [name] M.D.   On: 12/29/2023 09:16    Scheduled Meds:  amitriptyline   100 mg Oral QHS   calcitRIOL   0.25 mcg Oral Q breakfast   Deferiprone  (Twice Daily)  3,000 mg Oral Daily   folic acid   1 mg Oral Daily   heparin   5,000 Units Subcutaneous Q8H   HYDROmorphone    Intravenous Q4H   oxyCODONE   15 mg Oral Q12H   senna-docusate  1 tablet Oral BID   sodium bicarbonate   1,950 mg Oral TID   sodium zirconium cyclosilicate   10 g Oral Daily   Continuous Infusions:  Principal Problem:   Sickle cell disease with crisis (HCC) Active Problems:   Hyperkalemia   CKD (chronic kidney disease), stage IV (HCC)   TOBACCO ABUSE   Polysubstance abuse (HCC)   GERD (gastroesophageal reflux disease)   Anemia due to chronic kidney disease

## 2023-12-30 NOTE — Progress Notes (Signed)
 Pt signed AMA forms and left the unit at 6:50  pm. Dr Sim  and charge nurse Ava notified.

## 2023-12-31 ENCOUNTER — Encounter: Payer: Self-pay | Admitting: Nurse Practitioner

## 2023-12-31 ENCOUNTER — Ambulatory Visit: Payer: Self-pay | Admitting: Nurse Practitioner

## 2023-12-31 ENCOUNTER — Ambulatory Visit (INDEPENDENT_AMBULATORY_CARE_PROVIDER_SITE_OTHER): Payer: Self-pay | Admitting: Nurse Practitioner

## 2023-12-31 VITALS — BP 116/74 | HR 116 | Ht 63.0 in | Wt 122.8 lb

## 2023-12-31 DIAGNOSIS — D5700 Hb-SS disease with crisis, unspecified: Secondary | ICD-10-CM

## 2023-12-31 LAB — TYPE AND SCREEN
ABO/RH(D): O POS
Antibody Screen: NEGATIVE
Donor AG Type: NEGATIVE
Unit division: 0

## 2023-12-31 LAB — BPAM RBC
Blood Product Expiration Date: 202512212359
ISSUE DATE / TIME: 202511161107
Unit Type and Rh: 9500

## 2023-12-31 NOTE — Progress Notes (Signed)
 Subjective   Patient ID: [name] DELENA Brunilda Mickey., male    DOB: February 23, 1980, 43 y.o.   MRN: [id number]  Chief Complaint  Patient presents with   Sickle Cell Anemia   Medication Management    Need 10 mg Oxycodone  instead of 5 mg     Referring provider: [name], NP  [name] DELENA Brunilda Mickey. is a 43 y.o. male with Past Medical History: 09/24/2018: Abscess of right iliac fossa     Comment:  required Perc Drain 09/24/2018 No date: Arachnoid Cyst of brain bilaterally     Comment:  2 really small ones in the back of my head; inside; saw              them w/MRI (09/25/2012) ~ 2012: Bacterial pneumonia     Comment:  caught it here in the hospital (09/25/2012) No date: Chronic kidney disease     Comment:  from my sickle cell (09/25/2012) No date: CKD (chronic kidney disease), stage II 04/19/2017: Colitis     Comment:  CT scan abd/ pelvis No date: GERD (gastroesophageal reflux disease)     Comment:  after I eat alot of spicey foods (09/25/2012) 07/10/2012: Gynecomastia, male No date: History of blood transfusion     Comment:  always related to sickle cell crisis (09/25/2012) 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     Comment:  Noted in noted from Hematology notes at [facility] No date: Migraines     Comment:  take RX qd to prevent them (09/25/2012) 08/30/2012: Opioid dependence with withdrawal (HCC) No date: Renal insufficiency No date: Sickle cell anemia (HCC) 09/25/2012: Sickle cell crisis (HCC) 07/10/2012: Sickle cell nephropathy (HCC) No date: Sinus tachycardia 08/04/2016: Tachycardia with heart rate 121-140 beats per minute with  ambulation   HPI  Patient presents today for follow-up visit.  He is following with Dr. [name].  He would like refills on his pain medicine today.  This will be forwarded to Dr. [name] for review.  Patient does need labs today.  Overall he is stable today. Denies f/c/s, n/v/d, hemoptysis, PND, leg swelling. Denies  chest pain or edema.  Recently in the hospital after fall. Thinks medications from nephrology are causing more frequent flares. Needs to discuss with nephrology.    Allergies  Allergen Reactions   Nsaids Other (See Comments)    Kidney disease   Morphine  Other (See Comments)    -Really bad headaches    Immunization History  Administered Date(s) Administered   Influenza Split 12/08/2011   Influenza,inj,Quad PF,6+ Mos 10/11/2012, 12/13/2016, 11/15/2017, 01/06/2019, 01/28/2019, 01/13/2020, 12/15/2020, 01/24/2022   Influenza-Unspecified 10/11/2012, 12/13/2016   Moderna Sars-Covid-2 Vaccination 05/29/2019, 06/26/2019   Pneumococcal Conjugate-13 08/23/2017   Pneumococcal Polysaccharide-23 12/13/2016   Td 09/18/2004   Tdap 06/13/2017    Tobacco History: Social History   Tobacco Use  Smoking Status Former   Current packs/day: 0.10   Types: Cigarettes  Smokeless Tobacco Never   Counseling given: Not Answered   Outpatient Encounter Medications as of 12/31/2023  Medication Sig   amitriptyline  (ELAVIL ) 50 MG tablet Take 100 mg by mouth at bedtime.   calcitRIOL  (ROCALTROL ) 0.25 MCG capsule Take 0.25 mcg by mouth daily with breakfast.   Deferiprone , Twice Daily, (FERRIPROX  TWICE-A-DAY) 1000 MG TABS Take 2,000 mg by mouth daily.   folic acid  (FOLVITE ) 1 MG tablet TAKE 1 TABLET BY MOUTH EVERY DAY   hydroxyurea  (HYDREA ) 500 MG capsule Take 1  capsule by mouth in the morning on Sun/Tues/Wed/Thurs/Sat and 2 capsules on Mon/Fri   oxyCODONE  (OXYCONTIN ) 15 mg 12 hr tablet Take 1 tablet (15 mg total) by mouth every 12 (twelve) hours.   oxyCODONE  (ROXICODONE ) 5 MG immediate release tablet Take 2 tablets (10 mg total) by mouth every 4 (four) hours as needed for up to 15 days for severe pain (pain score 7-10).   sodium bicarbonate  650 MG tablet Take 1,950 mg by mouth 3 (three) times daily.   sodium zirconium cyclosilicate  (LOKELMA ) 10 g PACK packet Take 10 g by mouth daily.   triamcinolone cream  (KENALOG) 0.1 % Apply 1 Application topically 2 (two) times daily.   TYLENOL  8 HOUR ARTHRITIS PAIN 650 MG CR tablet Take 1,300 mg by mouth as needed for pain.   Facility-Administered Encounter Medications as of 12/31/2023  Medication   Darbepoetin Alfa  (ARANESP ) injection 150 mcg    Review of Systems  Review of Systems  Constitutional: Negative.   HENT: Negative.    Cardiovascular: Negative.   Gastrointestinal: Negative.   Allergic/Immunologic: Negative.   Neurological: Negative.   Psychiatric/Behavioral: Negative.       Objective:   BP 116/74 (BP Location: Right Arm, Patient Position: Sitting, Cuff Size: Normal)   Pulse (!) 116   Ht 5' 3 (1.6 m)   Wt 122 lb 12.8 oz (55.7 kg)   SpO2 100%   BMI 21.75 kg/m   Wt Readings from Last 5 Encounters:  12/31/23 122 lb 12.8 oz (55.7 kg)  12/29/23 115 lb 15.4 oz (52.6 kg)  10/25/23 125 lb 9.6 oz (57 kg)  10/03/23 125 lb 10.6 oz (57 kg)  08/20/23 128 lb (58.1 kg)     Physical Exam Vitals and nursing note reviewed.  Constitutional:      General: He is not in acute distress.    Appearance: He is well-developed.  Cardiovascular:     Rate and Rhythm: Normal rate and regular rhythm.  Pulmonary:     Effort: Pulmonary effort is normal.     Breath sounds: Normal breath sounds.  Skin:    General: Skin is warm and dry.  Neurological:     Mental Status: He is alert and oriented to person, place, and time.       Assessment & Plan:   Sickle cell anemia with pain (HCC) -     Sickle Cell Panel -     ToxAssure Flex 15, Ur     Return in about 3 months (around 04/01/2024).   Bascom GORMAN Borer, NP 12/31/2023

## 2024-01-01 ENCOUNTER — Other Ambulatory Visit: Payer: Self-pay | Admitting: Internal Medicine

## 2024-01-01 LAB — CMP14+CBC/D/PLT+FER+RETIC+V...
ALT: 30 IU/L (ref 0–44)
AST: 54 IU/L — ABNORMAL HIGH (ref 0–40)
Albumin: 3.9 g/dL — ABNORMAL LOW (ref 4.1–5.1)
Alkaline Phosphatase: 235 IU/L — ABNORMAL HIGH (ref 47–123)
BUN/Creatinine Ratio: 18 (ref 9–20)
BUN: 45 mg/dL — ABNORMAL HIGH (ref 6–24)
Basophils Absolute: 0 x10E3/uL (ref 0.0–0.2)
Basos: 0 %
Bilirubin Total: 1 mg/dL (ref 0.0–1.2)
CO2: 20 mmol/L (ref 20–29)
Calcium: 8.7 mg/dL (ref 8.7–10.2)
Chloride: 100 mmol/L (ref 96–106)
Creatinine, Ser: 2.53 mg/dL — ABNORMAL HIGH (ref 0.76–1.27)
EOS (ABSOLUTE): 0.1 x10E3/uL (ref 0.0–0.4)
Eos: 1 %
Ferritin: 3668 ng/mL — ABNORMAL HIGH (ref 30–400)
Globulin, Total: 2.9 g/dL (ref 1.5–4.5)
Glucose: 93 mg/dL (ref 70–99)
Hematocrit: 23.3 % — ABNORMAL LOW (ref 37.5–51.0)
Hemoglobin: 7.9 g/dL — ABNORMAL LOW (ref 13.0–17.7)
Immature Grans (Abs): 0 x10E3/uL (ref 0.0–0.1)
Immature Granulocytes: 0 %
Lymphocytes Absolute: 2.9 x10E3/uL (ref 0.7–3.1)
Lymphs: 41 %
MCH: 38.9 pg — ABNORMAL HIGH (ref 26.6–33.0)
MCHC: 33.9 g/dL (ref 31.5–35.7)
MCV: 115 fL — ABNORMAL HIGH (ref 79–97)
Monocytes Absolute: 0.4 x10E3/uL (ref 0.1–0.9)
Monocytes: 6 %
NRBC: 1 % — ABNORMAL HIGH (ref 0–0)
Neutrophils Absolute: 3.7 x10E3/uL (ref 1.4–7.0)
Neutrophils: 52 %
Platelets: 221 x10E3/uL (ref 150–450)
Potassium: 4.8 mmol/L (ref 3.5–5.2)
RBC: 2.03 x10E6/uL — CL (ref 4.14–5.80)
Retic Ct Pct: 4 % — ABNORMAL HIGH (ref 0.6–2.6)
Sodium: 138 mmol/L (ref 134–144)
Total Protein: 6.8 g/dL (ref 6.0–8.5)
Vit D, 25-Hydroxy: 14.3 ng/mL — ABNORMAL LOW (ref 30.0–100.0)
WBC: 7.1 x10E3/uL (ref 3.4–10.8)
eGFR: 32 mL/min/1.73 — ABNORMAL LOW (ref >59)

## 2024-01-01 NOTE — Telephone Encounter (Signed)
 Please advise [facility]

## 2024-01-01 NOTE — Hospital Course (Signed)
 Patient was admitted with sickle cell pain crisis, fracture involving his right knee area which was a chip fracture after sustaining a fall at home.  Also chronic kidney disease stage IV, hyperkalemia as well as anemia of chronic disease.  His hemoglobin dropped to 6.1 and subsequently 5.8.  He received 1 unit of packed red blood cells.  Patient was on Dilaudid  PCA.  No Toradol  due to chronic kidney disease.  Also oral pain medications.  Patient however became anxious.  Plan was to trend his H&H to make sure he is stable but patient decided to leave AGAINST MEDICAL ADVICE.

## 2024-01-01 NOTE — Telephone Encounter (Unsigned)
 Copied from CRM [id number]. Topic: Clinical - Medication Refill >> Jan 01, 2024 12:46 PM Shanda MATSU wrote: Medication: oxyCODONE  (ROXICODONE ) 5 MG immediate release tablet  Has the patient contacted their pharmacy? No, no refills on file  (Agent: If no, request that the patient contact the pharmacy for the refill. If patient does not wish to contact the pharmacy document the reason why and proceed with request.) (Agent: If yes, when and what did the pharmacy advise?)  This is the patient's preferred pharmacy:  [facility] [address], [place] - [address] AT [address] [place] [address] Phone: [phone] Fax: [phone]   Is this the correct pharmacy for this prescription? Yes If no, delete pharmacy and type the correct one.   Has the prescription been filled recently? Yes  Is the patient out of the medication? No  Has the patient been seen for an appointment in the last year OR does the patient have an upcoming appointment? Yes  Can we respond through MyChart? Yes  Agent: Please be advised that Rx refills may take up to 3 business days. We ask that you follow-up with your pharmacy.

## 2024-01-01 NOTE — Discharge Summary (Signed)
 Physician Discharge Summary   Patient: [name] [name]. MRN: [id number] DOB: 02/02/1981  Admit date:     12/29/2023  Discharge date: 12/30/2023  Discharge Physician: SIM KNOLL   PCP: [name], NP   Recommendations at discharge:   Patient left AGAINST MEDICAL ADVICE  Discharge Diagnoses: Principal Problem:   Sickle cell disease with crisis [facility]) Active Problems:   Hyperkalemia   CKD (chronic kidney disease), stage IV (HCC)   TOBACCO ABUSE   Polysubstance abuse (HCC)   GERD (gastroesophageal reflux disease)   Anemia due to chronic kidney disease  Resolved Problems:   * No resolved hospital problems. [facility] Course: Patient was admitted with sickle cell pain crisis, fracture involving his right knee area which was a chip fracture after sustaining a fall at home.  Also chronic kidney disease stage IV, hyperkalemia as well as anemia of chronic disease.  His hemoglobin dropped to 6.1 and subsequently 5.8.  He received 1 unit of packed red blood cells.  Patient was on Dilaudid  PCA.  No Toradol  due to chronic kidney disease.  Also oral pain medications.  Patient however became anxious.  Plan was to trend his H&H to make sure he is stable but patient decided to leave AGAINST MEDICAL ADVICE.  Assessment and Plan: No notes have been filed under this hospital service. Service: Hospitalist        Consultants: None Procedures performed: Chest x-ray Disposition: Left AGAINST MEDICAL ADVICE Diet recommendation:  Renal diet DISCHARGE MEDICATION: Allergies as of 12/30/2023       Reactions   Nsaids Other (See Comments)   Kidney disease   Morphine  Other (See Comments)   -Really bad headaches        Medication List     ASK your doctor about these medications    amitriptyline  50 MG tablet Commonly known as: ELAVIL  Take 100 mg by mouth at bedtime. Ask about: Which instructions should I use?   calcitRIOL  0.25 MCG capsule Commonly known as:  ROCALTROL  Take 0.25 mcg by mouth daily with breakfast.   Ferriprox  Twice-A-Day 1000 MG Tabs Generic drug: Deferiprone  (Twice Daily) Take 2,000 mg by mouth daily.   folic acid  1 MG tablet Commonly known as: FOLVITE  TAKE 1 TABLET BY MOUTH EVERY DAY   hydroxyurea  500 MG capsule Commonly known as: HYDREA  Take 1 capsule by mouth in the morning on Sun/Tues/Wed/Thurs/Sat and 2 capsules on Mon/Fri   oxyCODONE  5 MG immediate release tablet Commonly known as: Roxicodone  Take 2 tablets (10 mg total) by mouth every 4 (four) hours as needed for up to 15 days for severe pain (pain score 7-10).   oxyCODONE  15 mg 12 hr tablet Commonly known as: OXYCONTIN  Take 1 tablet (15 mg total) by mouth every 12 (twelve) hours.   sodium bicarbonate  650 MG tablet Take 1,950 mg by mouth 3 (three) times daily.   sodium zirconium cyclosilicate  10 g Pack packet Commonly known as: LOKELMA  Take 10 g by mouth daily.   triamcinolone  cream 0.1 % Commonly known as: KENALOG  Apply 1 Application topically 2 (two) times daily.   Tylenol  8 Hour Arthritis Pain 650 MG CR tablet Generic drug: acetaminophen  Take 1,300 mg by mouth as needed for pain.        Discharge Exam: Filed Weights   12/29/23 1348  Weight: 52.6 kg   Constitutional: NAD, calm, comfortable Eyes: PERRL, lids and conjunctivae normal ENMT: Mucous membranes are moist. Posterior pharynx clear of any exudate or lesions.Normal dentition.  Neck: normal, supple, no  masses, no thyromegaly Respiratory: clear to auscultation bilaterally, no wheezing, no crackles. Normal respiratory effort. No accessory muscle use.  Cardiovascular: Regular rate and rhythm, no murmurs / rubs / gallops. No extremity edema. 2+ pedal pulses. No carotid bruits.  Abdomen: no tenderness, no masses palpated. No hepatosplenomegaly. Bowel sounds positive.  Musculoskeletal: Good range of motion, no joint swelling or tenderness, Skin: no rashes, lesions, ulcers. No  induration Neurologic: CN 2-12 grossly intact. Sensation intact, DTR normal. Strength 5/5 in all 4.  Psychiatric: Normal judgment and insight. Alert and oriented x 3. Normal mood   Condition at discharge: fair  The results of significant diagnostics from this hospitalization (including imaging, microbiology, ancillary and laboratory) are listed below for reference.   Imaging Studies: CT Knee Right Wo Contrast Result Date: 12/29/2023 CLINICAL DATA:  Multiple recent falls. Knee trauma, occult fracture suspected. EXAM: CT OF THE RIGHT KNEE WITHOUT CONTRAST TECHNIQUE: Multidetector CT imaging of the right knee was performed according to the standard protocol. Multiplanar CT image reconstructions were also generated. RADIATION DOSE REDUCTION: This exam was performed according to the departmental dose-optimization program which includes automated exposure control, adjustment of the mA and/or kV according to patient size and/or use of iterative reconstruction technique. COMPARISON:  Radiographs 12/29/2023 and 10/02/2023. CT of the right knee 08/06/2023. FINDINGS: Bones/Joint/Cartilage Status post right total knee arthroplasty. The hardware is intact without loosening. There is no evidence of periprosthetic fracture. As suggested on the earlier radiographs, there is new irregularity of the inferior pole of the patella, suspicious for nondisplaced avulsion fracture. Unchanged peripherally sclerotic lucent lesion in the fibular head, possibly a chronic infarct. A moderate-sized complex knee joint effusion appears similar to the previous CT. Ligaments Suboptimally assessed by CT. Muscles and Tendons The quadriceps and patellar tendons are chronically thickened, but intact. No focal muscular abnormalities are identified. Soft tissues Partially obscured by artifact from the arthroplasty. No focal abnormality identified. IMPRESSION: 1. New irregularity of the inferior pole of the patella, suspicious for nondisplaced  avulsion fracture. Correlate with point tenderness. 2. No evidence of periprosthetic fracture or loosening of the right total knee arthroplasty. 3. Moderate-sized complex knee joint effusion, similar to previous CT. Electronically Signed   By: Elsie Perone M.D.   On: 12/29/2023 10:55   DG Knee Complete 4 Views Left Result Date: 12/29/2023 CLINICAL DATA:  Trauma last night due to fall with left knee pain. EXAM: LEFT KNEE - COMPLETE 4+ VIEW COMPARISON:  08/06/2023, 05/04/2021 and 05/23/2019 FINDINGS: Chronic mildly progressive heterogeneous sclerotic changes over the distal femoral condyle and proximal tibial metaphysis compatible with bone infarcts. Minimal osteoarthritic change of the left knee. No acute fracture or dislocation. No significant joint effusion. IMPRESSION: 1. No acute findings. 2. Chronic mildly progressive bone infarcts over the distal femoral condyle and proximal tibial metaphysis. Electronically Signed   By: Toribio Agreste M.D.   On: 12/29/2023 09:20   DG Knee Complete 4 Views Right Result Date: 12/29/2023 CLINICAL DATA:  Fall last night with right knee pain. EXAM: RIGHT KNEE - COMPLETE 4+ VIEW COMPARISON:  10/02/2023 FINDINGS: Evidence of previous right total knee arthroplasty with prosthetic components intact and normally located. Lucency through the inferior tip of the patella on the lateral film not seen previously as this may be due to an acute fracture. No significant joint effusion. Remainder of the exam is unchanged. IMPRESSION: Lucency through the inferior tip of the patella on the lateral film not seen previously as this may be due to an acute fracture. Recommend  clinical correlation. Electronically Signed   By: Toribio Agreste M.D.   On: 12/29/2023 09:16    Microbiology: Results for orders placed or performed in visit on 08/20/23  Chlamydia/Gonococcus/Trichomonas, NAA     Status: None   Collection Time: 08/20/23  9:46 AM   Specimen: Urine   UR  Result Value Ref Range  Status   Chlamydia by NAA Negative Negative Final   Gonococcus by NAA Negative Negative Final   Trich vag by NAA Negative Negative Final   *Note: Due to a large number of results and/or encounters for the requested time period, some results have not been displayed. A complete set of results can be found in Results Review.    Labs: CBC: Recent Labs  Lab 12/29/23 0855 12/29/23 1346 12/30/23 0658 12/30/23 1701 12/31/23 1228  WBC 8.5 7.1 7.5  --  7.1  NEUTROABS 5.2  --  2.7  --  3.7  HGB 6.3* 6.1* 5.6* 7.5* 7.9*  HCT 18.2* 18.1* 16.0* 21.5* 23.3*  MCV 122.1* 124.8* 124.0*  --  115*  PLT 146* 146* 153  --  221   Basic Metabolic Panel: Recent Labs  Lab 12/29/23 0855 12/29/23 1346 12/30/23 0658 12/31/23 1228  NA 134*  --  135 138  K 5.2*  --  4.6 4.8  CL 106  --  106 100  CO2 17*  --  19* 20  GLUCOSE 110*  --  102* 93  BUN 71*  --  59* 45*  CREATININE 3.96* 3.54* 3.16* 2.53*  CALCIUM  9.1  --  8.9 8.7   Liver Function Tests: Recent Labs  Lab 12/30/23 0658 12/31/23 1228  AST 45* 54*  ALT 27 30  ALKPHOS 195* 235*  BILITOT 1.0 1.0  PROT 6.6 6.8  ALBUMIN  3.4* 3.9*   CBG: No results for input(s): GLUCAP in the last 168 hours.  Discharge time spent: less than 30 minutes.  SignedBETHA SIM KNOLL, MD [facility] 01/01/2024

## 2024-01-02 ENCOUNTER — Ambulatory Visit: Payer: Self-pay | Admitting: Nurse Practitioner

## 2024-01-02 NOTE — Telephone Encounter (Signed)
 Please advise [facility]

## 2024-01-04 ENCOUNTER — Emergency Department (HOSPITAL_COMMUNITY)
Admission: EM | Admit: 2024-01-04 | Discharge: 2024-01-04 | Disposition: A | Discharge status: Home / Self Care | Attending: Emergency Medicine | Admitting: Emergency Medicine

## 2024-01-04 ENCOUNTER — Telehealth: Payer: Self-pay

## 2024-01-04 ENCOUNTER — Other Ambulatory Visit: Payer: Self-pay | Admitting: Nurse Practitioner

## 2024-01-04 ENCOUNTER — Emergency Department (HOSPITAL_COMMUNITY)

## 2024-01-04 DIAGNOSIS — R519 Headache, unspecified: Secondary | ICD-10-CM | POA: Diagnosis not present

## 2024-01-04 DIAGNOSIS — Y92410 Unspecified street and highway as the place of occurrence of the external cause: Secondary | ICD-10-CM | POA: Diagnosis not present

## 2024-01-04 DIAGNOSIS — S8001XA Contusion of right knee, initial encounter: Secondary | ICD-10-CM | POA: Diagnosis not present

## 2024-01-04 DIAGNOSIS — M545 Low back pain, unspecified: Secondary | ICD-10-CM | POA: Diagnosis not present

## 2024-01-04 DIAGNOSIS — N189 Chronic kidney disease, unspecified: Secondary | ICD-10-CM | POA: Insufficient documentation

## 2024-01-04 DIAGNOSIS — M25561 Pain in right knee: Secondary | ICD-10-CM | POA: Diagnosis present

## 2024-01-04 LAB — OPIATE CLASS, MS, UR RFX
Codeine: NOT DETECTED ng/mg{creat}
Dihydrocodeine: NOT DETECTED ng/mg{creat}
Hydrocodone: NOT DETECTED ng/mg{creat}
Hydromorphone: 1532 ng/mg{creat}
Morphine: NOT DETECTED ng/mg{creat}
Norcodeine: NOT DETECTED ng/mg{creat}
Norhydrocodone: NOT DETECTED ng/mg{creat}
Normorphine: NOT DETECTED ng/mg{creat}
Opiate Class Confirmation: POSITIVE

## 2024-01-04 LAB — TOXASSURE FLEX 15, UR
6-ACETYLMORPHINE IA: NEGATIVE ng/mL
7-aminoclonazepam: NOT DETECTED ng/mg{creat}
AMPHETAMINES IA: NEGATIVE ng/mL
Alpha-hydroxyalprazolam: NOT DETECTED ng/mg{creat}
Alpha-hydroxymidazolam: NOT DETECTED ng/mg{creat}
Alpha-hydroxytriazolam: NOT DETECTED ng/mg{creat}
Alprazolam: NOT DETECTED ng/mg{creat}
BARBITURATES IA: NEGATIVE ng/mL
BUPRENORPHINE: NEGATIVE
Benzodiazepines: NEGATIVE
Buprenorphine: NOT DETECTED ng/mg{creat}
COCAINE METABOLITE IA: NEGATIVE ng/mL
Clonazepam: NOT DETECTED ng/mg{creat}
Creatinine: 120 mg/dL (ref >=20)
Desalkylflurazepam: NOT DETECTED ng/mg{creat}
Desmethyldiazepam: NOT DETECTED ng/mg{creat}
Desmethylflunitrazepam: NOT DETECTED ng/mg{creat}
Diazepam: NOT DETECTED ng/mg{creat}
ETHYL ALCOHOL Enzymatic: NEGATIVE g/dL
FENTANYL: NEGATIVE
Fentanyl: NOT DETECTED ng/mg{creat}
Flunitrazepam: NOT DETECTED ng/mg{creat}
Lorazepam: NOT DETECTED ng/mg{creat}
METHADONE IA: NEGATIVE ng/mL
METHADONE MTB IA: NEGATIVE ng/mL
Midazolam: NOT DETECTED ng/mg{creat}
Norbuprenorphine: NOT DETECTED ng/mg{creat}
Norfentanyl: NOT DETECTED ng/mg{creat}
Oxazepam: NOT DETECTED ng/mg{creat}
PHENCYCLIDINE IA: NEGATIVE ng/mL
TAPENTADOL, IA: NEGATIVE ng/mL
TRAMADOL IA: NEGATIVE ng/mL
Temazepam: NOT DETECTED ng/mg{creat}

## 2024-01-04 LAB — OXYCODONE CLASS, MS, UR RFX
Noroxycodone: 1051 ng/mg{creat}
Noroxymorphone: 1470 ng/mg{creat}
Oxycodone Class Confirmation: POSITIVE
Oxycodone: 650 ng/mg{creat}
Oxymorphone: 3011 ng/mg{creat}

## 2024-01-04 LAB — CANNABINOIDS, MS, UR RFX
Cannabinoids Confirmation: POSITIVE
Carboxy-THC: 53 ng/mg{creat}

## 2024-01-04 MED ORDER — OXYCODONE-ACETAMINOPHEN 5-325 MG PO TABS
2.0000 | ORAL_TABLET | Freq: Once | ORAL | Status: AC
  Administered 2024-01-04: 2 via ORAL
  Filled 2024-01-04: qty 2

## 2024-01-04 MED ORDER — OXYCODONE HCL 5 MG PO TABS: 10.0000 mg | ORAL_TABLET | ORAL | 0 refills | Status: DC | PRN

## 2024-01-04 NOTE — Progress Notes (Signed)
 Orthopedic Tech Progress Note Patient Details:  [name] [name]. 05-13-1980 [id number]  Ortho Devices Type of Ortho Device: Knee Immobilizer Ortho Device/Splint Location: RLE Ortho Device/Splint Interventions: Application   Post Interventions Patient Tolerated: Well  [name] 01/04/2024, 4:03 PM

## 2024-01-04 NOTE — Progress Notes (Signed)
RX refill sent to pharmacy.
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
## 2024-01-08 ENCOUNTER — Other Ambulatory Visit: Payer: Self-pay

## 2024-01-08 MED ORDER — OXYCODONE HCL 10 MG PO TABS: 10.0000 mg | ORAL_TABLET | ORAL | 0 refills | Status: DC | PRN

## 2024-01-08 NOTE — Telephone Encounter (Signed)
 Copied from CRM [id number]. Topic: Clinical - Medication Prior Auth >> Jan 08, 2024  9:58 AM Logan F wrote: Reason for CRM: Karleen from [facility] says the rx for oxyCODONE  (ROXICODONE ) 5 MG immediate release tablet was called in but [employer] is requring a PA unless it is written for 10MG  for 1 every 4 hours. Right now the rx is written for him to take 2 every 4 hours.   Karleen [phone]    .advise

## 2024-01-08 NOTE — Telephone Encounter (Signed)
 Need Pa for Oxy 5 mg Qty 120 . Please advise Pt in office . KH

## 2024-01-08 NOTE — Telephone Encounter (Signed)
 Done [facility]

## 2024-01-16 ENCOUNTER — Non-Acute Institutional Stay (HOSPITAL_COMMUNITY)
Admission: AD | Admit: 2024-01-16 | Discharge: 2024-01-16 | Disposition: A | Discharge status: Home / Self Care | Source: Ambulatory Visit | Attending: Internal Medicine | Admitting: Internal Medicine

## 2024-01-16 ENCOUNTER — Telehealth (HOSPITAL_COMMUNITY): Payer: Self-pay

## 2024-01-16 DIAGNOSIS — D5700 Hb-SS disease with crisis, unspecified: Secondary | ICD-10-CM | POA: Diagnosis present

## 2024-01-16 LAB — CBC WITH DIFFERENTIAL/PLATELET
Abs Immature Granulocytes: 0.01 K/uL (ref 0.00–0.07)
Basophils Absolute: 0 K/uL (ref 0.0–0.1)
Basophils Relative: 0 %
Eosinophils Absolute: 0 K/uL (ref 0.0–0.5)
Eosinophils Relative: 0 %
HCT: 24.3 % — ABNORMAL LOW (ref 39.0–52.0)
Hemoglobin: 8.1 g/dL — ABNORMAL LOW (ref 13.0–17.0)
Immature Granulocytes: 0 %
Lymphocytes Relative: 52 %
Lymphs Abs: 4.6 K/uL — ABNORMAL HIGH (ref 0.7–4.0)
MCH: 39.5 pg — ABNORMAL HIGH (ref 26.0–34.0)
MCHC: 33.3 g/dL (ref 30.0–36.0)
MCV: 118.5 fL — ABNORMAL HIGH (ref 80.0–100.0)
Monocytes Absolute: 0.5 K/uL (ref 0.1–1.0)
Monocytes Relative: 6 %
Neutro Abs: 3.6 K/uL (ref 1.7–7.7)
Neutrophils Relative %: 42 %
Platelets: 329 K/uL (ref 150–400)
RBC: 2.05 MIL/uL — ABNORMAL LOW (ref 4.22–5.81)
RDW: 25.9 % — ABNORMAL HIGH (ref 11.5–15.5)
WBC: 8.7 K/uL (ref 4.0–10.5)
nRBC: 0.2 % (ref 0.0–0.2)

## 2024-01-16 LAB — COMPREHENSIVE METABOLIC PANEL WITH GFR
ALT: 34 U/L (ref 0–44)
AST: 53 U/L — ABNORMAL HIGH (ref 15–41)
Albumin: 3.7 g/dL (ref 3.5–5.0)
Alkaline Phosphatase: 216 U/L — ABNORMAL HIGH (ref 38–126)
Anion gap: 11 (ref 5–15)
BUN: 54 mg/dL — ABNORMAL HIGH (ref 6–20)
CO2: 21 mmol/L — ABNORMAL LOW (ref 22–32)
Calcium: 9 mg/dL (ref 8.9–10.3)
Chloride: 106 mmol/L (ref 98–111)
Creatinine, Ser: 3.08 mg/dL — ABNORMAL HIGH (ref 0.61–1.24)
GFR, Estimated: 25 mL/min — ABNORMAL LOW (ref >60)
Glucose, Bld: 98 mg/dL (ref 70–99)
Potassium: 5 mmol/L (ref 3.5–5.1)
Sodium: 138 mmol/L (ref 135–145)
Total Bilirubin: 0.6 mg/dL (ref 0.0–1.2)
Total Protein: 7.1 g/dL (ref 6.5–8.1)

## 2024-01-16 LAB — RETICULOCYTES
Immature Retic Fract: 20.4 % — ABNORMAL HIGH (ref 2.3–15.9)
RBC.: 2 MIL/uL — ABNORMAL LOW (ref 4.22–5.81)
Retic Count, Absolute: 64.2 K/uL (ref 19.0–186.0)
Retic Ct Pct: 3.2 % — ABNORMAL HIGH (ref 0.4–3.1)

## 2024-01-16 LAB — LACTATE DEHYDROGENASE: LDH: 149 U/L (ref 105–235)

## 2024-01-16 MED ORDER — SENNOSIDES-DOCUSATE SODIUM 8.6-50 MG PO TABS
1.0000 | ORAL_TABLET | Freq: Two times a day (BID) | ORAL | Status: DC
  Filled 2024-01-16: qty 1

## 2024-01-16 MED ORDER — DIPHENHYDRAMINE HCL 25 MG PO CAPS
25.0000 mg | ORAL_CAPSULE | ORAL | Status: DC | PRN
  Administered 2024-01-16: 25 mg via ORAL
  Filled 2024-01-16: qty 1

## 2024-01-16 MED ORDER — NALOXONE HCL 0.4 MG/ML IJ SOLN: 0.4000 mg | INTRAMUSCULAR | Status: DC | PRN

## 2024-01-16 MED ORDER — SODIUM CHLORIDE 0.45 % IV SOLN: INTRAVENOUS | Status: DC

## 2024-01-16 MED ORDER — HYDROMORPHONE 1 MG/ML IV SOLN
INTRAVENOUS | Status: DC
  Administered 2024-01-16: 7 mg via INTRAVENOUS
  Administered 2024-01-16: 30 mg via INTRAVENOUS
  Filled 2024-01-16: qty 30

## 2024-01-16 MED ORDER — ONDANSETRON HCL 4 MG/2ML IJ SOLN: 4.0000 mg | Freq: Four times a day (QID) | INTRAMUSCULAR | Status: DC | PRN

## 2024-01-16 MED ORDER — SODIUM CHLORIDE 0.9% FLUSH: 9.0000 mL | INTRAVENOUS | Status: DC | PRN

## 2024-01-16 MED ORDER — POLYETHYLENE GLYCOL 3350 17 G PO PACK: 17.0000 g | PACK | Freq: Every day | ORAL | Status: DC | PRN
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
## 2024-01-16 NOTE — H&P (Addendum)
 [facility] History and Physical  Renato A Brunilda Raddle. FMW:[id number] DOB: [birth year] DOA: 01/16/2024  PCP: [name], NP   Chief Complaint: No chief complaint on file.   HPI: [name] [name]. is a 43 y.o. male with history of sickle cell disease, Chronic pain syndrome, opiate dependence and tolerance, anemia of chronic disease, and CKD stage IV who presented to the day hospital with major complaints of generalized body pain but primarily in his lower extremities and back, rates pain at 10/10. He describes this pain as constant and achy and throbbing.  Patient is attributing pain to change in weather.  Systemic Review: General: The patient denies anorexia, fever, weight loss Cardiac: Denies chest pain, syncope, palpitations, pedal edema  Respiratory: Denies cough, shortness of breath, wheezing GI: Denies severe indigestion/heartburn, abdominal pain, nausea, vomiting, diarrhea and constipation GU: Denies hematuria, incontinence, dysuria  Musculoskeletal: Generalized body tenderness.   Skin: Denies suspicious skin lesions Neurologic: Denies focal weakness or numbness, change in vision  Past Medical History:  Diagnosis Date   Abscess of right iliac fossa 09/24/2018   required Perc Drain 09/24/2018   Arachnoid Cyst of brain bilaterally    2 really small ones in the back of my head; inside; saw them w/MRI (09/25/2012)   Bacterial pneumonia ~ 2012   caught it here in the hospital (09/25/2012)   Chronic kidney disease    from my sickle cell (09/25/2012)   CKD (chronic kidney disease), stage II    Colitis 04/19/2017   CT scan abd/ pelvis   GERD (gastroesophageal reflux disease)    after I eat alot of spicey foods (09/25/2012)   Gynecomastia, male 07/10/2012   History of blood transfusion    always related to sickle cell crisis (09/25/2012)   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   Noted in noted from Hematology notes at [facility]    Migraines    take RX qd to prevent them (09/25/2012)   Opioid dependence with withdrawal (HCC) 08/30/2012   Renal insufficiency    Sickle cell anemia (HCC)    Sickle cell crisis (HCC) 09/25/2012   Sickle cell nephropathy (HCC) 07/10/2012   Sinus tachycardia    Tachycardia with heart rate 121-140 beats per minute with ambulation 08/04/2016    Past Surgical History:  Procedure Laterality Date   ABCESS DRAINAGE     CHOLECYSTECTOMY  ~ 2012   COLONOSCOPY N/A 04/23/2017   Procedure: COLONOSCOPY;  Surgeon: [name], MD;  Location: [facility];  Service: Endoscopy;  Laterality: N/A;   IR FLUORO GUIDE CV LINE RIGHT  12/17/2016   IR FLUORO GUIDE CV LINE RIGHT  02/07/2021   IR RADIOLOGIST EVAL & MGMT  10/02/2018   IR RADIOLOGIST EVAL & MGMT  10/15/2018   IR REMOVAL TUN CV CATH W/O FL  12/21/2016   IR REMOVAL TUN CV CATH W/O FL  02/11/2021   IR US  GUIDE VASC ACCESS RIGHT  12/17/2016   IR US  GUIDE VASC ACCESS RIGHT  02/07/2021   spleenectomy      Allergies  Allergen Reactions   Nsaids Other (See Comments)    Kidney disease   Morphine  Other (See Comments)    -Really bad headaches    Family History  Problem Relation Age of Onset   Breast cancer Mother       Prior to Admission medications   Medication Sig Start Date End Date Taking? Authorizing Provider  amitriptyline  (ELAVIL ) 50 MG tablet Take 100 mg by mouth at bedtime. 09/20/23  [provider]  calcitRIOL  (ROCALTROL ) 0.25 MCG capsule Take 0.25 mcg by mouth daily with breakfast. 11/16/20   [provider]  Deferiprone , Twice Daily, (FERRIPROX  TWICE-A-DAY) 1000 MG TABS Take 3,000 mg by mouth daily. 05/01/22   Tilford Bertram HERO, FNP  folic acid  (FOLVITE ) 1 MG tablet TAKE 1 TABLET BY MOUTH EVERY DAY 03/22/20   Hollis, Lachina M, FNP  hydroxyurea  (HYDREA ) 500 MG capsule Take 1 capsule by mouth in the morning on Sun/Tues/Wed/Thurs/Sat and 2 capsules on Mon/Fri 01/25/23   Nichols, [name], NP  oxyCODONE  (OXYCONTIN ) 15 mg 12 hr  tablet Take 1 tablet (15 mg) by mouth every 12 hours. 11/18/23 12/24/23  Sim Emery CROME, MD  Oxycodone  HCl 10 MG TABS Take 1 tablet (10 mg total) by mouth See admin instructions for 15 days. Take 10 mg by mouth every 6 hours 11/29/23 12/14/23  Nichols, [name], NP  sodium bicarbonate  650 MG tablet Take 1,950 mg by mouth 3 (three) times daily. 11/07/18   [provider]  sodium zirconium cyclosilicate  (LOKELMA ) 10 g PACK packet Take 10 g by mouth daily. Patient not taking: Reported on 12/04/2023 04/21/23 04/22/24  [provider]  triamcinolone  cream (KENALOG ) 0.1 % Apply 1 Application topically 2 (two) times daily. 12/04/23   Paseda, Folashade R, FNP  TYLENOL  8 HOUR ARTHRITIS PAIN 650 MG CR tablet Take 1,300 mg by mouth as needed for pain.    [provider]     Physical Exam: Vitals:   01/16/24 1004 01/16/24 1205  BP:  92/64  Resp: 12 10  SpO2:  100%     General: Alert, awake, afebrile, anicteric, not in obvious distress HEENT: Normocephalic and Atraumatic, Mucous membranes pink                PERRLA; EOM intact; No scleral icterus,                 Nares: Patent, Oropharynx: Clear, Fair Dentition                 Neck: FROM, no cervical lymphadenopathy, thyromegaly, carotid bruit or JVD;  CHEST WALL: No tenderness  CHEST: Normal respiration, clear to auscultation bilaterally  HEART: Regular rate and rhythm; no murmurs rubs or gallops  BACK: No kyphosis or scoliosis; no CVA tenderness  ABDOMEN: Positive Bowel Sounds, soft, non-tender; no masses, no organomegaly EXTREMITIES: No cyanosis, clubbing, or edema SKIN:  no rash or ulceration  CNS: Alert and Oriented x 4, Nonfocal exam, CN 2-12 intact  Labs on Admission:  Basic Metabolic Panel: Recent Labs  Lab 01/16/24 0945  NA 138  K 5.0  CL 106  CO2 21*  GLUCOSE 98  BUN 54*  CREATININE 3.08*  CALCIUM  9.0   Liver Function Tests: Recent Labs  Lab 01/16/24 0945  AST 53*  ALT 34  ALKPHOS 216*   BILITOT 0.6  PROT 7.1  ALBUMIN  3.7   No results for input(s): LIPASE, AMYLASE in the last 168 hours. No results for input(s): AMMONIA in the last 168 hours. CBC: Recent Labs  Lab 01/16/24 0945  WBC 8.7  NEUTROABS 3.6  HGB 8.1*  HCT 24.3*  MCV 118.5*  PLT 329   Cardiac Enzymes: No results for input(s): CKTOTAL, CKMB, CKMBINDEX, TROPONINI in the last 168 hours.  BNP (last 3 results) No results for input(s): BNP in the last 8760 hours.  ProBNP (last 3 results) No results for input(s): PROBNP in the last 8760 hours.  CBG: No results for input(s):  GLUCAP in the last 168 hours.   Assessment/Plan Principal Problem:   Sickle cell anemia with pain (HCC)  Admits to the Day Hospital for extended observation IVF 0.45% Saline @ 75 mls/hour Weight based Dilaudid  PCA started within 30 minutes of admission Acetaminophen  1000 mg x 1 dose Labs: CBCD, CMP, Retic Count and LDH Monitor vitals very closely, Re-evaluate pain scale every hour 2 L of Oxygen  by Pierce Patient will be re-evaluated for pain in the context of function and relationship to baseline as care progresses. If no significant relieve from pain (remains above 5/10) will transfer patient to inpatient services for further evaluation and management  Code Status: Full  Family Communication: None  DVT Prophylaxis: Ambulate as tolerated   Time spent: 44 Minutes  Homer CHRISTELLA Cover NP  If 7PM-7AM,01/16/2024, 2:17 PM

## 2024-01-16 NOTE — Telephone Encounter (Signed)
 Patient called in. Complains of pain in legs and lower back rates 10/10. Denied chest pain, abd pain, fever, N/V/D, or priapism; however states he is having difficulty urinating since yesterday and urine is dark and concentrated, he states that he feels dehydrated. Wants to come in for treatment. Last took 10mg  Oxycodone  at 5am. Pt denies recent visits to the ED. Pt denies covid exposure or flu like symptoms. Pt states his mother is his transportation to and from center today. [name] notified, approved for pt to be seen in the day hospital today. Pt made aware, verbalized understanding.

## 2024-01-16 NOTE — Progress Notes (Signed)
 Patient admitted to the day hospital for sickle cell pain treatment .  On arrival patient rates 10/10 pain. Pt received Dilaudid  PCA 0.5/10/3 , 0.45% NS hydration IV fluids via PIV.  Pt also received PO tylenol .  At discharge, patient rates pain 5/10.  AVS offered but patient declined.  Pt is alert and oriented , ambulatory at discharge.

## 2024-01-22 ENCOUNTER — Other Ambulatory Visit: Payer: Self-pay | Admitting: Nurse Practitioner

## 2024-01-22 DIAGNOSIS — D571 Sickle-cell disease without crisis: Secondary | ICD-10-CM

## 2024-01-22 MED ORDER — OXYCODONE HCL 10 MG PO TABS: 10.0000 mg | ORAL_TABLET | ORAL | 0 refills | Status: AC | PRN

## 2024-01-22 MED ORDER — OXYCODONE HCL ER 15 MG PO T12A: 15.0000 mg | EXTENDED_RELEASE_TABLET | Freq: Two times a day (BID) | ORAL | 0 refills | Status: DC

## 2024-01-22 NOTE — Telephone Encounter (Signed)
 Copied from CRM [id number]. Topic: Clinical - Medication Refill >> Jan 22, 2024  9:30 AM Lonell PEDLAR wrote: Medication: oxyCODONE  (OXYCONTIN ) 15 mg 12 hr tablet Oxycodone  HCl 10 MG TABS   Has the patient contacted their pharmacy? No, narcotic, pt must call pcp   This is the patient's preferred pharmacy:  [facility] [address] [place] - [address] [place] [address] Phone: [phone] Fax: [phone]  Is this the correct pharmacy for this prescription? Yes If no, delete pharmacy and type the correct one.   Has the prescription been filled recently? Yes  Is the patient out of the medication? No  Has the patient been seen for an appointment in the last year OR does the patient have an upcoming appointment? Yes  Can we respond through MyChart? Yes  Agent: Please be advised that Rx refills may take up to 3 business days. We ask that you follow-up with your pharmacy.

## 2024-01-22 NOTE — Telephone Encounter (Signed)
 Please advise. Thank you. KH

## 2024-01-24 ENCOUNTER — Encounter: Payer: Self-pay | Admitting: Internal Medicine

## 2024-01-24 ENCOUNTER — Ambulatory Visit: Payer: Self-pay | Admitting: Internal Medicine

## 2024-01-24 VITALS — BP 98/61 | HR 98 | Wt 127.0 lb

## 2024-01-24 DIAGNOSIS — N184 Chronic kidney disease, stage 4 (severe): Secondary | ICD-10-CM

## 2024-01-24 DIAGNOSIS — D571 Sickle-cell disease without crisis: Secondary | ICD-10-CM | POA: Diagnosis not present

## 2024-01-24 DIAGNOSIS — L0232 Furuncle of buttock: Secondary | ICD-10-CM | POA: Diagnosis not present

## 2024-01-24 DIAGNOSIS — G894 Chronic pain syndrome: Secondary | ICD-10-CM

## 2024-01-24 MED ORDER — DOXYCYCLINE HYCLATE 100 MG PO TABS: 100.0000 mg | ORAL_TABLET | Freq: Two times a day (BID) | ORAL | 0 refills | Status: AC

## 2024-01-24 MED ORDER — FOLIC ACID 1 MG PO TABS: 1.0000 mg | ORAL_TABLET | Freq: Every day | ORAL | 4 refills | Status: AC

## 2024-01-24 NOTE — Progress Notes (Unsigned)
 [name] [name], is a 43 y.o. male  RDW:[id number]  FMW:[id number]  DOB - 29-May-1980  Chief Complaint  Patient presents with   Sickle Cell Anemia   Pain Management       Subjective:   [name] [name] is a 43 y.o. male here today for a follow up visit. Patient has No headache, No chest pain, No abdominal pain - No Nausea, No new weakness tingling or numbness, No Cough - SOB.  No problems updated.  ALLERGIES: Allergies[1]  PAST MEDICAL HISTORY: Past Medical History:  Diagnosis Date   Abscess of right iliac fossa 09/24/2018   required Perc Drain 09/24/2018   Arachnoid Cyst of brain bilaterally    2 really small ones in the back of my head; inside; saw them w/MRI (09/25/2012)   Bacterial pneumonia ~ 2012   caught it here in the hospital (09/25/2012)   Chronic kidney disease    from my sickle cell (09/25/2012)   CKD (chronic kidney disease), stage II    Colitis 04/19/2017   CT scan abd/ pelvis   GERD (gastroesophageal reflux disease)    after I eat alot of spicey foods (09/25/2012)   Gynecomastia, male 07/10/2012   History of blood transfusion    always related to sickle cell crisis (09/25/2012)   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   Noted in noted from Hematology notes at [facility]   Migraines    take RX qd to prevent them (09/25/2012)   Opioid dependence with withdrawal (HCC) 08/30/2012   Renal insufficiency    Sickle cell anemia (HCC)    Sickle cell crisis (HCC) 09/25/2012   Sickle cell nephropathy (HCC) 07/10/2012   Sinus tachycardia    Tachycardia with heart rate 121-140 beats per minute with ambulation 08/04/2016    MEDICATIONS AT HOME: Prior to Admission medications  Medication Sig Start Date End Date Taking? Authorizing Provider  amitriptyline  (ELAVIL ) 50 MG tablet Take 100 mg by mouth at bedtime. 09/20/23  Yes [provider]  calcitRIOL  (ROCALTROL ) 0.25 MCG capsule Take 0.25 mcg by mouth daily with breakfast. 11/16/20  Yes  [provider]  Deferiprone , Twice Daily, (FERRIPROX  TWICE-A-DAY) 1000 MG TABS Take 2,000 mg by mouth daily. 05/01/22  Yes Tilford Bertram HERO, FNP  doxycycline  (VIBRA -TABS) 100 MG tablet Take 1 tablet (100 mg total) by mouth 2 (two) times daily. 01/24/24  Yes Ilian Wessell E, MD  hydroxyurea  (HYDREA ) 500 MG capsule Take 1 capsule by mouth in the morning on Sun/Tues/Wed/Thurs/Sat and 2 capsules on Mon/Fri 01/25/23  Yes [name], NP  oxyCODONE  (OXYCONTIN ) 15 mg 12 hr tablet Take 1 tablet (15 mg total) by mouth every 12 (twelve) hours. 01/22/24 02/21/24 Yes Cherylene Homer HERO, NP  Oxycodone  HCl 10 MG TABS Take 1 tablet (10 mg total) by mouth every 4 (four) hours as needed for up to 15 days. 01/22/24 02/06/24 Yes Cherylene Homer HERO, NP  sodium bicarbonate  650 MG tablet Take 1,950 mg by mouth 3 (three) times daily. 11/07/18  Yes [provider]  sodium zirconium cyclosilicate  (LOKELMA ) 10 g PACK packet Take 10 g by mouth daily. 04/21/23 04/22/24 Yes [provider]  TYLENOL  8 HOUR ARTHRITIS PAIN 650 MG CR tablet Take 1,300 mg by mouth as needed for pain.   Yes [provider]  folic acid  (FOLVITE ) 1 MG tablet Take 1 tablet (1 mg total) by mouth daily. 01/24/24   Rakin Lemelle E, MD  triamcinolone  cream (KENALOG ) 0.1 % Apply 1 Application topically 2 (two) times daily. 12/04/23  Paseda, Folashade R, FNP    Objective:   Vitals:   01/24/24 1039  BP: 98/61  Pulse: 98  SpO2: 100%  Weight: 127 lb (57.6 kg)   Exam General appearance : Awake, alert, not in any distress. Speech Clear. Not toxic looking HEENT: Atraumatic and Normocephalic, pupils equally reactive to light and accomodation Neck: Supple, no JVD. No cervical lymphadenopathy.  Chest: Good air entry bilaterally, no added sounds  CVS: S1 S2 regular, no murmurs.  Abdomen: Bowel sounds present, Non tender and not distended with no gaurding, rigidity or rebound. Extremities: B/L Lower Ext shows no  edema, both legs are warm to touch Neurology: Awake alert, and oriented X 3, CN II-XII intact, Non focal Skin: No Rash  Data Review Lab Results  Component Value Date   HGBA1C 8.3 03/07/2023   HGBA1C <4.2 (L) 03/07/2018   HGBA1C <4.0 (L) 03/11/2015    Assessment & Plan   1. Hb-SS disease without crisis (HCC) *** - folic acid  (FOLVITE ) 1 MG tablet; Take 1 tablet (1 mg total) by mouth daily.  Dispense: 90 tablet; Refill: 4 - Sickle Cell Panel  2. Chronic pain syndrome (Primary) ***  3. Furuncle of buttock *** - Herpes simplex virus(hsv) dna by pcr  4. CKD (chronic kidney disease), stage IV (HCC) ***    Patient have been counseled extensively about nutrition and exercise. Other issues discussed during this visit include: low cholesterol diet, weight control and daily exercise, foot care, annual eye examinations at Ophthalmology, importance of adherence with medications and regular follow-up. We also discussed long term complications of uncontrolled diabetes and hypertension.   Return in about 4 weeks (around 02/21/2024) for Routine Follow Up, CKD/ESRD, Sickle Cell Disease/Pain.  The patient was given clear instructions to go to ER or return to medical center if symptoms don't improve, worsen or new problems develop. The patient verbalized understanding. The patient was told to call to get lab results if they haven't heard anything in the next week.   This note has been created with Education officer, environmental. Any transcriptional errors are unintentional.    Precious Schatz, MD, MHA, GENI BEL, CPE [facility] and [facility] [place], [place] [phone]   01/24/2024, 11:21 AM     [1]  Allergies Allergen Reactions   Nsaids Other (See Comments)    Kidney disease   Morphine  Other (See Comments)    -Really bad headaches

## 2024-01-26 LAB — CMP14+CBC/D/PLT+FER+RETIC+V...
ALT: 29 IU/L (ref 0–44)
AST: 37 IU/L (ref 0–40)
Albumin: 3.9 g/dL — ABNORMAL LOW (ref 4.1–5.1)
Alkaline Phosphatase: 230 IU/L — ABNORMAL HIGH (ref 47–123)
BUN/Creatinine Ratio: 16 (ref 9–20)
BUN: 59 mg/dL — ABNORMAL HIGH (ref 6–24)
Basophils Absolute: 0 x10E3/uL (ref 0.0–0.2)
Basos: 0 %
Bilirubin Total: 0.7 mg/dL (ref 0.0–1.2)
CO2: 21 mmol/L (ref 20–29)
Calcium: 8.8 mg/dL (ref 8.7–10.2)
Chloride: 102 mmol/L (ref 96–106)
Creatinine, Ser: 3.63 mg/dL — ABNORMAL HIGH (ref 0.76–1.27)
EOS (ABSOLUTE): 0.1 x10E3/uL (ref 0.0–0.4)
Eos: 1 %
Ferritin: 3841 ng/mL — ABNORMAL HIGH (ref 30–400)
Globulin, Total: 3.1 g/dL (ref 1.5–4.5)
Glucose: 90 mg/dL (ref 70–99)
Hematocrit: 25.7 % — ABNORMAL LOW (ref 37.5–51.0)
Hemoglobin: 8.6 g/dL — ABNORMAL LOW (ref 13.0–17.7)
Immature Grans (Abs): 0 x10E3/uL (ref 0.0–0.1)
Immature Granulocytes: 0 %
Lymphocytes Absolute: 4.5 x10E3/uL — ABNORMAL HIGH (ref 0.7–3.1)
Lymphs: 46 %
MCH: 39.3 pg — ABNORMAL HIGH (ref 26.6–33.0)
MCHC: 33.5 g/dL (ref 31.5–35.7)
MCV: 117 fL — ABNORMAL HIGH (ref 79–97)
Monocytes Absolute: 0.6 x10E3/uL (ref 0.1–0.9)
Monocytes: 6 %
Neutrophils Absolute: 4.6 x10E3/uL (ref 1.4–7.0)
Neutrophils: 47 %
Platelets: 247 x10E3/uL (ref 150–450)
Potassium: 6.5 mmol/L — ABNORMAL HIGH (ref 3.5–5.2)
RBC: 2.19 x10E6/uL — CL (ref 4.14–5.80)
Retic Ct Pct: 3.8 % — ABNORMAL HIGH (ref 0.6–2.6)
Sodium: 138 mmol/L (ref 134–144)
Total Protein: 7 g/dL (ref 6.0–8.5)
Vit D, 25-Hydroxy: 14.7 ng/mL — ABNORMAL LOW (ref 30.0–100.0)
WBC: 9.7 x10E3/uL (ref 3.4–10.8)
eGFR: 21 mL/min/1.73 — ABNORMAL LOW (ref >59)

## 2024-01-26 LAB — HSV DNA BY PCR (REFERENCE LAB)

## 2024-01-30 ENCOUNTER — Other Ambulatory Visit: Payer: Self-pay

## 2024-01-30 ENCOUNTER — Telehealth (HOSPITAL_COMMUNITY): Payer: Self-pay | Admitting: *Deleted

## 2024-01-30 ENCOUNTER — Non-Acute Institutional Stay (HOSPITAL_COMMUNITY)
Admission: AD | Admit: 2024-01-30 | Discharge: 2024-01-30 | Disposition: A | Discharge status: Home / Self Care | Source: Ambulatory Visit | Attending: Internal Medicine | Admitting: Internal Medicine

## 2024-01-30 DIAGNOSIS — D5700 Hb-SS disease with crisis, unspecified: Secondary | ICD-10-CM | POA: Diagnosis present

## 2024-01-30 DIAGNOSIS — N184 Chronic kidney disease, stage 4 (severe): Secondary | ICD-10-CM | POA: Diagnosis not present

## 2024-01-30 DIAGNOSIS — G894 Chronic pain syndrome: Secondary | ICD-10-CM | POA: Insufficient documentation

## 2024-01-30 DIAGNOSIS — F1120 Opioid dependence, uncomplicated: Secondary | ICD-10-CM | POA: Insufficient documentation

## 2024-01-30 DIAGNOSIS — D631 Anemia in chronic kidney disease: Secondary | ICD-10-CM | POA: Diagnosis not present

## 2024-01-30 LAB — CBC WITH DIFFERENTIAL/PLATELET
Abs Immature Granulocytes: 0.03 K/uL (ref 0.00–0.07)
Basophils Absolute: 0 K/uL (ref 0.0–0.1)
Basophils Relative: 0 %
Eosinophils Absolute: 0.1 K/uL (ref 0.0–0.5)
Eosinophils Relative: 1 %
HCT: 22.7 % — ABNORMAL LOW (ref 39.0–52.0)
Hemoglobin: 8 g/dL — ABNORMAL LOW (ref 13.0–17.0)
Immature Granulocytes: 0 %
Lymphocytes Relative: 44 %
Lymphs Abs: 4.5 K/uL — ABNORMAL HIGH (ref 0.7–4.0)
MCH: 41.9 pg — ABNORMAL HIGH (ref 26.0–34.0)
MCHC: 35.2 g/dL (ref 30.0–36.0)
MCV: 118.8 fL — ABNORMAL HIGH (ref 80.0–100.0)
Monocytes Absolute: 0.5 K/uL (ref 0.1–1.0)
Monocytes Relative: 5 %
Neutro Abs: 5.1 K/uL (ref 1.7–7.7)
Neutrophils Relative %: 50 %
Platelets: 204 K/uL (ref 150–400)
RBC: 1.91 MIL/uL — ABNORMAL LOW (ref 4.22–5.81)
RDW: 24.6 % — ABNORMAL HIGH (ref 11.5–15.5)
Smear Review: NORMAL
WBC: 10.3 K/uL (ref 4.0–10.5)
nRBC: 0 % (ref 0.0–0.2)

## 2024-01-30 LAB — COMPREHENSIVE METABOLIC PANEL WITH GFR
ALT: 39 U/L (ref 0–44)
AST: 43 U/L — ABNORMAL HIGH (ref 15–41)
Albumin: 3.9 g/dL (ref 3.5–5.0)
Alkaline Phosphatase: 240 U/L — ABNORMAL HIGH (ref 38–126)
Anion gap: 11 (ref 5–15)
BUN: 57 mg/dL — ABNORMAL HIGH (ref 6–20)
CO2: 19 mmol/L — ABNORMAL LOW (ref 22–32)
Calcium: 9.2 mg/dL (ref 8.9–10.3)
Chloride: 106 mmol/L (ref 98–111)
Creatinine, Ser: 3.15 mg/dL — ABNORMAL HIGH (ref 0.61–1.24)
GFR, Estimated: 24 mL/min — ABNORMAL LOW (ref >60)
Glucose, Bld: 100 mg/dL — ABNORMAL HIGH (ref 70–99)
Potassium: 5 mmol/L (ref 3.5–5.1)
Sodium: 137 mmol/L (ref 135–145)
Total Bilirubin: 0.8 mg/dL (ref 0.0–1.2)
Total Protein: 7.7 g/dL (ref 6.5–8.1)

## 2024-01-30 LAB — RETICULOCYTES
Immature Retic Fract: 20.5 % — ABNORMAL HIGH (ref 2.3–15.9)
RBC.: 1.94 MIL/uL — ABNORMAL LOW (ref 4.22–5.81)
Retic Count, Absolute: 66.2 K/uL (ref 19.0–186.0)
Retic Ct Pct: 3.4 % — ABNORMAL HIGH (ref 0.4–3.1)

## 2024-01-30 LAB — LACTATE DEHYDROGENASE: LDH: 185 U/L (ref 105–235)

## 2024-01-30 MED ORDER — SODIUM CHLORIDE 0.9% FLUSH: 9.0000 mL | INTRAVENOUS | Status: DC | PRN

## 2024-01-30 MED ORDER — DIPHENHYDRAMINE HCL 25 MG PO CAPS
25.0000 mg | ORAL_CAPSULE | ORAL | Status: DC | PRN
  Administered 2024-01-30: 10:00:00 25 mg via ORAL
  Filled 2024-01-30: qty 1

## 2024-01-30 MED ORDER — SODIUM CHLORIDE 0.45 % IV SOLN: INTRAVENOUS | Status: DC

## 2024-01-30 MED ORDER — ACETAMINOPHEN 500 MG PO TABS
1000.0000 mg | ORAL_TABLET | Freq: Once | ORAL | Status: AC
  Administered 2024-01-30: 10:00:00 1000 mg via ORAL
  Filled 2024-01-30: qty 2

## 2024-01-30 MED ORDER — SENNOSIDES-DOCUSATE SODIUM 8.6-50 MG PO TABS
1.0000 | ORAL_TABLET | Freq: Two times a day (BID) | ORAL | Status: DC
  Filled 2024-01-30: qty 1

## 2024-01-30 MED ORDER — POLYETHYLENE GLYCOL 3350 17 G PO PACK: 17.0000 g | PACK | Freq: Every day | ORAL | Status: DC | PRN

## 2024-01-30 MED ORDER — NALOXONE HCL 0.4 MG/ML IJ SOLN: 0.4000 mg | INTRAMUSCULAR | Status: DC | PRN

## 2024-01-30 MED ORDER — ONDANSETRON HCL 4 MG/2ML IJ SOLN: 4.0000 mg | Freq: Four times a day (QID) | INTRAMUSCULAR | Status: DC | PRN

## 2024-01-30 MED ORDER — HYDROMORPHONE 1 MG/ML IV SOLN
INTRAVENOUS | Status: DC
  Administered 2024-01-30: 16:00:00 6.5 mg via INTRAVENOUS
  Administered 2024-01-30: 10:00:00 30 mg via INTRAVENOUS
  Filled 2024-01-30: qty 30
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
## 2024-01-30 NOTE — H&P (Signed)
 [facility] History and Physical  [name] [name]. FMW:[id number] DOB: [birth year] DOA: 01/30/2024  PCP: [name], NP   Chief Complaint: No chief complaint on file.   HPI: [name] [name]. is a 43 y.o. male with history of sickle cell disease, Chronic pain syndrome, opiate dependence and tolerance, anemia of chronic disease, and CKD stage IV who presented to the day hospital with major complaints of generalized body pain but primarily in his lower extremities and back, rates pain at 9/10. He describes this pain as constant and achy and throbbing.  Patient is attributing pain to change in weather.  Systemic Review: General: The patient denies anorexia, fever, weight loss Cardiac: Denies chest pain, syncope, palpitations, pedal edema  Respiratory: Denies cough, shortness of breath, wheezing GI: Denies severe indigestion/heartburn, abdominal pain, nausea, vomiting, diarrhea and constipation GU: Denies hematuria, incontinence, dysuria  Musculoskeletal: Generalized body tenderness.   Skin: Denies suspicious skin lesions Neurologic: Denies focal weakness or numbness, change in vision  Past Medical History:  Diagnosis Date   Abscess of right iliac fossa 09/24/2018   required Perc Drain 09/24/2018   Arachnoid Cyst of brain bilaterally    2 really small ones in the back of my head; inside; saw them w/MRI (09/25/2012)   Bacterial pneumonia ~ 2012   caught it here in the hospital (09/25/2012)   Chronic kidney disease    from my sickle cell (09/25/2012)   CKD (chronic kidney disease), stage II    Colitis 04/19/2017   CT scan abd/ pelvis   GERD (gastroesophageal reflux disease)    after I eat alot of spicey foods (09/25/2012)   Gynecomastia, male 07/10/2012   History of blood transfusion    always related to sickle cell crisis (09/25/2012)   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   Noted in noted from Hematology notes at [facility]    Migraines    take RX qd to prevent them (09/25/2012)   Opioid dependence with withdrawal (HCC) 08/30/2012   Renal insufficiency    Sickle cell anemia (HCC)    Sickle cell crisis (HCC) 09/25/2012   Sickle cell nephropathy (HCC) 07/10/2012   Sinus tachycardia    Tachycardia with heart rate 121-140 beats per minute with ambulation 08/04/2016    Past Surgical History:  Procedure Laterality Date   ABCESS DRAINAGE     CHOLECYSTECTOMY  ~ 2012   COLONOSCOPY N/A 04/23/2017   Procedure: COLONOSCOPY;  Surgeon: [name], MD;  Location: [facility];  Service: Endoscopy;  Laterality: N/A;   IR FLUORO GUIDE CV LINE RIGHT  12/17/2016   IR FLUORO GUIDE CV LINE RIGHT  02/07/2021   IR RADIOLOGIST EVAL & MGMT  10/02/2018   IR RADIOLOGIST EVAL & MGMT  10/15/2018   IR REMOVAL TUN CV CATH W/O FL  12/21/2016   IR REMOVAL TUN CV CATH W/O FL  02/11/2021   IR US  GUIDE VASC ACCESS RIGHT  12/17/2016   IR US  GUIDE VASC ACCESS RIGHT  02/07/2021   spleenectomy      Allergies  Allergen Reactions   Nsaids Other (See Comments)    Kidney disease   Morphine  Other (See Comments)    -Really bad headaches    Family History  Problem Relation Age of Onset   Breast cancer Mother       Prior to Admission medications   Medication Sig Start Date End Date Taking? Authorizing Provider  amitriptyline  (ELAVIL ) 50 MG tablet Take 100 mg by mouth at bedtime. 09/20/23  [provider]  calcitRIOL  (ROCALTROL ) 0.25 MCG capsule Take 0.25 mcg by mouth daily with breakfast. 11/16/20   [provider]  Deferiprone , Twice Daily, (FERRIPROX  TWICE-A-DAY) 1000 MG TABS Take 3,000 mg by mouth daily. 05/01/22   Tilford Bertram HERO, FNP  folic acid  (FOLVITE ) 1 MG tablet TAKE 1 TABLET BY MOUTH EVERY DAY 03/22/20   Hollis, Lachina M, FNP  hydroxyurea  (HYDREA ) 500 MG capsule Take 1 capsule by mouth in the morning on Sun/Tues/Wed/Thurs/Sat and 2 capsules on Mon/Fri 01/25/23   Nichols, [name], NP  oxyCODONE  (OXYCONTIN ) 15 mg 12 hr  tablet Take 1 tablet (15 mg) by mouth every 12 hours. 11/18/23 12/24/23  Sim Emery CROME, MD  Oxycodone  HCl 10 MG TABS Take 1 tablet (10 mg total) by mouth See admin instructions for 15 days. Take 10 mg by mouth every 6 hours 11/29/23 12/14/23  [name], NP  sodium bicarbonate  650 MG tablet Take 1,950 mg by mouth 3 (three) times daily. 11/07/18   [provider]  sodium zirconium cyclosilicate  (LOKELMA ) 10 g PACK packet Take 10 g by mouth daily. Patient not taking: Reported on 12/04/2023 04/21/23 04/22/24  [provider]  triamcinolone  cream (KENALOG ) 0.1 % Apply 1 Application topically 2 (two) times daily. 12/04/23   Paseda, Folashade R, FNP  TYLENOL  8 HOUR ARTHRITIS PAIN 650 MG CR tablet Take 1,300 mg by mouth as needed for pain.    [provider]     Physical Exam: There were no vitals filed for this visit.    General: Alert, awake, afebrile, anicteric, not in obvious distress HEENT: Normocephalic and Atraumatic, Mucous membranes pink                PERRLA; EOM intact; No scleral icterus,                 Nares: Patent, Oropharynx: Clear, Fair Dentition                 Neck: FROM, no cervical lymphadenopathy, thyromegaly, carotid bruit or JVD;  CHEST WALL: No tenderness  CHEST: Normal respiration, clear to auscultation bilaterally  HEART: Regular rate and rhythm; no murmurs rubs or gallops  BACK: No kyphosis or scoliosis; no CVA tenderness  ABDOMEN: Positive Bowel Sounds, soft, non-tender; no masses, no organomegaly EXTREMITIES: No cyanosis, clubbing, or edema SKIN:  no rash or ulceration  CNS: Alert and Oriented x 4, Nonfocal exam, CN 2-12 intact  Labs on Admission:  Basic Metabolic Panel: Recent Labs  Lab 01/24/24 1131  NA 138  K 6.5*  CL 102  CO2 21  GLUCOSE 90  BUN 59*  CREATININE 3.63*  CALCIUM  8.8   Liver Function Tests: Recent Labs  Lab 01/24/24 1131  AST 37  ALT 29  ALKPHOS 230*  BILITOT 0.7  PROT 7.0  ALBUMIN  3.9*   No  results for input(s): LIPASE, AMYLASE in the last 168 hours. No results for input(s): AMMONIA in the last 168 hours. CBC: Recent Labs  Lab 01/24/24 1131  WBC 9.7  NEUTROABS 4.6  HGB 8.6*  HCT 25.7*  MCV 117*  PLT 247   Cardiac Enzymes: No results for input(s): CKTOTAL, CKMB, CKMBINDEX, TROPONINI in the last 168 hours.  BNP (last 3 results) No results for input(s): BNP in the last 8760 hours.  ProBNP (last 3 results) No results for input(s): PROBNP in the last 8760 hours.  CBG: No results for input(s): GLUCAP in the last 168 hours.   Assessment/Plan Principal Problem:  Sickle cell anemia with pain (HCC)  Admits to the Day Hospital for extended observation IVF 0.45% Saline @ 75 mls/hour Weight based Dilaudid  PCA started within 30 minutes of admission Acetaminophen  1000 mg x 1 dose Labs: CBCD, CMP, Retic Count and LDH Monitor vitals very closely, Re-evaluate pain scale every hour 2 L of Oxygen  by  Patient will be re-evaluated for pain in the context of function and relationship to baseline as care progresses. If no significant relieve from pain (remains above 5/10) will transfer patient to inpatient services for further evaluation and management  Code Status: Full  Family Communication: None  DVT Prophylaxis: Ambulate as tolerated   Time spent: 21 Minutes  Homer CHRISTELLA Cover NP  If 7PM-7AM,01/30/2024, 9:21 AM

## 2024-01-30 NOTE — Telephone Encounter (Signed)
 Patient called in. Complains of pain in whole body rates 10/10. Denied chest pain, abd pain, fever, N/V/D. Wants to come in for treatment. Last took oxycodone  10 mg and oxycodone  15 mg at 0500 am  no relief.  Spoke with [name] NP and patient can come to day hospital for treatment of sickle cell pain. Patient verbalized understanding and will be here soon.

## 2024-01-30 NOTE — Progress Notes (Signed)
 Pt admitted to the day hospital for treatment of sickle cell pain crisis. Pt reported generalized pain rated 10/10.  Pt given PO Benadryl  and Tylenol ,placed on Dilaudid  PCA 0.5/10/3, and hydrated with 1/2NS IV fluids. At discharge patient reported pain a 5/10. Pt declined printed AVS.Pt alert , oriented and ambulatory at discharge. Taxi arranged by staff to pt residence, address confirmed with pt.

## 2024-02-05 ENCOUNTER — Emergency Department (HOSPITAL_COMMUNITY)

## 2024-02-05 ENCOUNTER — Emergency Department (HOSPITAL_COMMUNITY)
Admission: EM | Admit: 2024-02-05 | Discharge: 2024-02-05 | Disposition: A | Discharge status: Home / Self Care | Attending: Emergency Medicine | Admitting: Emergency Medicine

## 2024-02-05 ENCOUNTER — Encounter (HOSPITAL_COMMUNITY): Payer: Self-pay

## 2024-02-05 ENCOUNTER — Other Ambulatory Visit: Payer: Self-pay

## 2024-02-05 DIAGNOSIS — Z79899 Other long term (current) drug therapy: Secondary | ICD-10-CM | POA: Diagnosis not present

## 2024-02-05 DIAGNOSIS — R112 Nausea with vomiting, unspecified: Secondary | ICD-10-CM | POA: Insufficient documentation

## 2024-02-05 DIAGNOSIS — D5700 Hb-SS disease with crisis, unspecified: Secondary | ICD-10-CM | POA: Diagnosis present

## 2024-02-05 DIAGNOSIS — N189 Chronic kidney disease, unspecified: Secondary | ICD-10-CM | POA: Diagnosis not present

## 2024-02-05 LAB — CBC WITH DIFFERENTIAL/PLATELET
Abs Immature Granulocytes: 0.01 K/uL (ref 0.00–0.07)
Basophils Absolute: 0 K/uL (ref 0.0–0.1)
Basophils Relative: 0 %
Eosinophils Absolute: 0.2 K/uL (ref 0.0–0.5)
Eosinophils Relative: 3 %
HCT: 21.6 % — ABNORMAL LOW (ref 39.0–52.0)
Hemoglobin: 7.4 g/dL — ABNORMAL LOW (ref 13.0–17.0)
Immature Granulocytes: 0 %
Lymphocytes Relative: 54 %
Lymphs Abs: 4.1 K/uL — ABNORMAL HIGH (ref 0.7–4.0)
MCH: 40.2 pg — ABNORMAL HIGH (ref 26.0–34.0)
MCHC: 34.3 g/dL (ref 30.0–36.0)
MCV: 117.4 fL — ABNORMAL HIGH (ref 80.0–100.0)
Monocytes Absolute: 0.3 K/uL (ref 0.1–1.0)
Monocytes Relative: 4 %
Neutro Abs: 3 K/uL (ref 1.7–7.7)
Neutrophils Relative %: 39 %
Platelets: 267 K/uL (ref 150–400)
RBC: 1.84 MIL/uL — ABNORMAL LOW (ref 4.22–5.81)
RDW: 24.6 % — ABNORMAL HIGH (ref 11.5–15.5)
Smear Review: NORMAL
WBC: 7.7 K/uL (ref 4.0–10.5)
nRBC: 0 % (ref 0.0–0.2)

## 2024-02-05 LAB — URINALYSIS, ROUTINE W REFLEX MICROSCOPIC
Bacteria, UA: NONE SEEN
Bilirubin Urine: NEGATIVE
Glucose, UA: 50 mg/dL — AB
Hgb urine dipstick: NEGATIVE
Ketones, ur: NEGATIVE mg/dL
Leukocytes,Ua: NEGATIVE
Nitrite: NEGATIVE
Protein, ur: 30 mg/dL — AB
Specific Gravity, Urine: 1.006 (ref 1.005–1.030)
pH: 7 (ref 5.0–8.0)

## 2024-02-05 LAB — COMPREHENSIVE METABOLIC PANEL WITH GFR
ALT: 51 U/L — ABNORMAL HIGH (ref 0–44)
AST: 66 U/L — ABNORMAL HIGH (ref 15–41)
Albumin: 3.9 g/dL (ref 3.5–5.0)
Alkaline Phosphatase: 225 U/L — ABNORMAL HIGH (ref 38–126)
Anion gap: 12 (ref 5–15)
BUN: 52 mg/dL — ABNORMAL HIGH (ref 6–20)
CO2: 22 mmol/L (ref 22–32)
Calcium: 9.2 mg/dL (ref 8.9–10.3)
Chloride: 104 mmol/L (ref 98–111)
Creatinine, Ser: 3.18 mg/dL — ABNORMAL HIGH (ref 0.61–1.24)
GFR, Estimated: 24 mL/min — ABNORMAL LOW
Glucose, Bld: 92 mg/dL (ref 70–99)
Potassium: 5.1 mmol/L (ref 3.5–5.1)
Sodium: 138 mmol/L (ref 135–145)
Total Bilirubin: 0.8 mg/dL (ref 0.0–1.2)
Total Protein: 7.7 g/dL (ref 6.5–8.1)

## 2024-02-05 LAB — RETICULOCYTES
Immature Retic Fract: 20.3 % — ABNORMAL HIGH (ref 2.3–15.9)
RBC.: 1.8 MIL/uL — ABNORMAL LOW (ref 4.22–5.81)
Retic Count, Absolute: 65.5 K/uL (ref 19.0–186.0)
Retic Ct Pct: 3.6 % — ABNORMAL HIGH (ref 0.4–3.1)

## 2024-02-05 MED ORDER — ONDANSETRON HCL 4 MG/2ML IJ SOLN: 4.0000 mg | INTRAMUSCULAR | Status: DC | PRN

## 2024-02-05 MED ORDER — HYDROMORPHONE HCL 1 MG/ML IJ SOLN
2.0000 mg | INTRAMUSCULAR | Status: AC
  Administered 2024-02-05: 2 mg via INTRAVENOUS
  Filled 2024-02-05: qty 2

## 2024-02-05 MED ORDER — OXYCODONE HCL 5 MG PO TABS
15.0000 mg | ORAL_TABLET | Freq: Once | ORAL | Status: AC
  Administered 2024-02-05: 15 mg via ORAL
  Filled 2024-02-05 (×2): qty 3

## 2024-02-05 MED ORDER — LACTATED RINGERS IV BOLUS
500.0000 mL | Freq: Once | INTRAVENOUS | Status: AC
  Administered 2024-02-05: 500 mL via INTRAVENOUS

## 2024-02-05 MED ORDER — SODIUM CHLORIDE 0.9 % IV SOLN
12.5000 mg | Freq: Once | INTRAVENOUS | Status: AC
  Administered 2024-02-05: 12.5 mg via INTRAVENOUS
  Filled 2024-02-05: qty 12.5

## 2024-02-05 NOTE — ED Provider Notes (Signed)
 " [place] EMERGENCY DEPARTMENT AT [facility] Provider Note   CSN: [id number] Arrival date & time: 02/05/24  0602     Patient presents with: Sickle Cell Pain Crisis   [name] [name]. is a 43 y.o. male with a history of sickle cell anemia and CKD who presents to the ED with sickle cell pain crisis that began yesterday.  The patient states that he started experiencing pain yesterday morning and has taken his home pain regimen without relief.  Patient states that the pain he is currently experiencing feels like his usual pain crisis. Patient states that he has also had a couple episodes of nausea and vomiting for the past 24 hours. The patient reports no chest pain or shortness of breath.  Patient denies any fevers.  Patient was recently hospitalized at the sickle cell day hospital for same on 12/17 and discharged with symptomatic improvement. No recent travel. No sick contacts. The patient is in no acute distress.    Sickle Cell Pain Crisis      Prior to Admission medications  Medication Sig Start Date End Date Taking? Authorizing Provider  amitriptyline  (ELAVIL ) 50 MG tablet Take 100 mg by mouth at bedtime. 09/20/23   [provider]  calcitRIOL  (ROCALTROL ) 0.25 MCG capsule Take 0.25 mcg by mouth daily with breakfast. 11/16/20   [provider]  Deferiprone , Twice Daily, (FERRIPROX  TWICE-A-DAY) 1000 MG TABS Take 2,000 mg by mouth daily. 05/01/22   Tilford Bertram HERO, FNP  doxycycline  (VIBRA -TABS) 100 MG tablet Take 1 tablet (100 mg total) by mouth 2 (two) times daily. 01/24/24   [name], [name], MD  folic acid  (FOLVITE ) 1 MG tablet Take 1 tablet (1 mg total) by mouth daily. 01/24/24   [name], [name], MD  hydroxyurea  (HYDREA ) 500 MG capsule Take 1 capsule by mouth in the morning on Sun/Tues/Wed/Thurs/Sat and 2 capsules on Mon/Fri 01/25/23   Nichols, [name], NP  oxyCODONE  (OXYCONTIN ) 15 mg 12 hr tablet Take 1 tablet (15 mg total) by mouth every 12  (twelve) hours. 01/22/24 02/21/24  Ijaola, [name] M, NP  sodium bicarbonate  650 MG tablet Take 1,950 mg by mouth 3 (three) times daily. 11/07/18   [provider]  sodium zirconium cyclosilicate  (LOKELMA ) 10 g PACK packet Take 10 g by mouth daily. 04/21/23 04/22/24  [provider]  TYLENOL  8 HOUR ARTHRITIS PAIN 650 MG CR tablet Take 1,300 mg by mouth as needed for pain.    [provider]    Allergies: Nsaids and Morphine     Review of Systems  Musculoskeletal:  Positive for arthralgias.    Updated Vital Signs BP 106/76   Pulse 78   Temp 97.6 F (36.4 C) (Oral)   Resp 18   Ht 5' 3 (1.6 m)   Wt 55.8 kg   SpO2 100%   BMI 21.79 kg/m   Physical Exam Vitals and nursing note reviewed.  Constitutional:      General: He is not in acute distress.    Appearance: Normal appearance.  HENT:     Head: Normocephalic and atraumatic.  Eyes:     Extraocular Movements: Extraocular movements intact.     Conjunctiva/sclera: Conjunctivae normal.     Pupils: Pupils are equal, round, and reactive to light.  Cardiovascular:     Rate and Rhythm: Normal rate and regular rhythm.     Pulses: Normal pulses.  Pulmonary:     Effort: Pulmonary effort is normal. No respiratory distress.  Abdominal:  General: Abdomen is flat.     Palpations: Abdomen is soft.     Tenderness: There is no abdominal tenderness.  Musculoskeletal:        General: Normal range of motion.     Cervical back: Normal range of motion.  Skin:    General: Skin is warm and dry.     Capillary Refill: Capillary refill takes less than 2 seconds.  Neurological:     General: No focal deficit present.     Mental Status: He is alert. Mental status is at baseline.  Psychiatric:        Mood and Affect: Mood normal.    (all labs ordered are listed, but only abnormal results are displayed) Labs Reviewed  CBC WITH DIFFERENTIAL/PLATELET - Abnormal; Notable for the following components:      Result Value   RBC  1.84 (*)    Hemoglobin 7.4 (*)    HCT 21.6 (*)    MCV 117.4 (*)    MCH 40.2 (*)    RDW 24.6 (*)    Lymphs Abs 4.1 (*)    All other components within normal limits  RETICULOCYTES - Abnormal; Notable for the following components:   Retic Ct Pct 3.6 (*)    RBC. 1.80 (*)    Immature Retic Fract 20.3 (*)    All other components within normal limits  COMPREHENSIVE METABOLIC PANEL WITH GFR - Abnormal; Notable for the following components:   BUN 52 (*)    Creatinine, Ser 3.18 (*)    AST 66 (*)    ALT 51 (*)    Alkaline Phosphatase 225 (*)    GFR, Estimated 24 (*)    All other components within normal limits  URINALYSIS, ROUTINE W REFLEX MICROSCOPIC - Abnormal; Notable for the following components:   Color, Urine STRAW (*)    Glucose, UA 50 (*)    Protein, ur 30 (*)    All other components within normal limits    EKG: None  Radiology: No results found.   Procedures   Medications Ordered in the ED  HYDROmorphone  (DILAUDID ) injection 2 mg (2 mg Intravenous Given 02/05/24 0819)  HYDROmorphone  (DILAUDID ) injection 2 mg (2 mg Intravenous Given 02/05/24 0857)  diphenhydrAMINE  (BENADRYL ) 12.5 mg in sodium chloride  0.9 % 50 mL IVPB (0 mg Intravenous Stopped 02/05/24 0836)  oxyCODONE  (Oxy IR/ROXICODONE ) immediate release tablet 15 mg (15 mg Oral Given 02/05/24 1050)  lactated ringers  bolus 500 mL (0 mLs Intravenous Stopped 02/05/24 1110)                                 Medical Decision Making  Patient presents to the ED for: sickle cell pain crisis This involves an extensive number of treatment options and is a complaint that carries with it a high risk of complications  Differential diagnosis includes: Sickle cell crisis Co-morbid conditions: Sickle cell anemia, CKD  Additional history/records obtained and reviewed: Additional history obtained from  outside medical records External records from outside source obtained and reviewed including prior pain crisis admissions and  current pain management regimen  Clinical Course as of 02/08/24 0302  Tue Feb 05, 2024  0657 Temp: 98.2 F (36.8 C) Afebrile, vitals stable, patient in no acute distress [ML]  0757 Reticulocytes(!) Reticulocytes per baseline [ML]  0757 CBC WITH DIFFERENTIAL(!) CBC per baseline [ML]  0835 DG Chest Port 1 View Unremarkable [ML]  1130 Patient received lactated ringer  bolus, diphenhydramine ,  and multiple doses of analgesic with symptomatic relief [ML]  1200 Urinalysis, Routine w reflex microscopic -Urine, Clean CatchMAGNUS [place] [ML]    Clinical Course User Index [ML] Willma Duwaine CROME, PA    Data Reviewed / Actions Taken: Labs ordered/reviewed with my independent interpretation in ED course above. Imaging ordered/reviewed with my independent interpretation in ED course above. I agree with the radiologists interpretation.  Management / Treatments: See ED course above for medications, treatments administered, and clinical rationale.   Reevaluation of the patient after these medicines showed that the patient improved I have reviewed the patients home medicines and have made adjustments as needed  ED Course / Reassessments: Problem List: Sickle cell pain crisis  43 year old male presented for sickle cell pain crisis. Initial assessment included history, physical exam, and review of prior medical records.  Patient's workup was overall reassuring, and patient's laboratory studies were per patient's baseline.  Patient's symptoms were managed appropriately with pain management regimen with symptomatic relief.  Vital signs stayed within normal limits for duration of ED stay.  Upon shared decision making, patient symptoms were deemed appropriate for outpatient management with home pain medication regimen.  Patient given strict return precautions should symptoms persist or worsen, and patient's to follow-up with PCP for further evaluation and care of worsening SCA pain. patient response: Improved  with pain management Serial reassessments performed: Yes    Disposition: Disposition: Discharge with close follow-up with PCP for further evaluation and care Rationale for disposition: Stable for discharge The disposition plan and rationale were discussed with the patient at the bedside, all questions were addressed, and the patient demonstrated understanding.  This note was produced using Electronics Engineer. While I have reviewed and verified all clinical information, transcription errors may remain.      Final diagnoses:  Sickle cell anemia with pain [facility])    ED Discharge Orders     None          Willma Duwaine CROME, [place] 02/08/24 0302    Neysa Caron PARAS, DO 02/08/24 [id number]  "

## 2024-02-05 NOTE — ED Triage Notes (Signed)
 Pt reports with [facility] since yesterday. Pt has pain all over along with fatigue. Last pain medication taken at 0300 this morning. (Oxycontin  15 mg, Oxycodone  10 mg).

## 2024-02-05 NOTE — Discharge Instructions (Addendum)
 Thank you for visiting the Emergency Department today.  It was a pleasure to be part of your healthcare team.  You were seen today for sickle cell pain crisis.  Your workup was overall reassuring.  It is important to follow-up with your provider on your sickle cell anemia and continue to take your pain medications as directed. If you have any questions about your medicines, please call your pharmacy or healthcare provider. If symptoms worsen or persist-please follow-up at your nearest emergency department for further evaluation and care.   Thank you for trusting us  with your health.

## 2024-02-12 ENCOUNTER — Other Ambulatory Visit: Payer: Self-pay | Admitting: Nurse Practitioner

## 2024-02-12 ENCOUNTER — Other Ambulatory Visit: Payer: Self-pay

## 2024-02-12 ENCOUNTER — Telehealth: Payer: Self-pay

## 2024-02-12 DIAGNOSIS — D571 Sickle-cell disease without crisis: Secondary | ICD-10-CM

## 2024-02-12 MED ORDER — OXYCODONE HCL ER 15 MG PO T12A: 15.0000 mg | EXTENDED_RELEASE_TABLET | Freq: Two times a day (BID) | ORAL | 0 refills | Status: DC

## 2024-02-12 MED ORDER — OXYCODONE HCL 10 MG PO TABS: 10.0000 mg | ORAL_TABLET | ORAL | 0 refills | Status: DC | PRN

## 2024-02-12 NOTE — Telephone Encounter (Signed)
 Rx has been done. KH

## 2024-02-12 NOTE — Telephone Encounter (Signed)
 Pt was called to advise medication was sent to the pharmacy. KH

## 2024-02-12 NOTE — Telephone Encounter (Signed)
 Please advise in [place] absence. Thank you.

## 2024-02-21 ENCOUNTER — Telehealth (HOSPITAL_COMMUNITY): Payer: Self-pay | Admitting: *Deleted

## 2024-02-21 NOTE — Telephone Encounter (Signed)
 Patient called requesting to come to the day hospital for sickle cell pain. Patient reports pain in bilateral legs. The day hospital is closed today due to staffing. Notified patient of this and advised patient to try and manage at home with home pain medications today. Patient can call to the day hospital in the morning for triage if pain remains uncontrolled. Patient advised and expresses an understanding.

## 2024-02-22 ENCOUNTER — Telehealth (HOSPITAL_COMMUNITY): Payer: Self-pay | Admitting: *Deleted

## 2024-02-22 ENCOUNTER — Non-Acute Institutional Stay (HOSPITAL_COMMUNITY)
Admission: AD | Admit: 2024-02-22 | Discharge: 2024-02-22 | Disposition: A | Discharge status: Home / Self Care | Source: Ambulatory Visit | Attending: Internal Medicine | Admitting: Internal Medicine

## 2024-02-22 DIAGNOSIS — N184 Chronic kidney disease, stage 4 (severe): Secondary | ICD-10-CM | POA: Insufficient documentation

## 2024-02-22 DIAGNOSIS — G894 Chronic pain syndrome: Secondary | ICD-10-CM | POA: Insufficient documentation

## 2024-02-22 DIAGNOSIS — D631 Anemia in chronic kidney disease: Secondary | ICD-10-CM | POA: Insufficient documentation

## 2024-02-22 DIAGNOSIS — D5700 Hb-SS disease with crisis, unspecified: Secondary | ICD-10-CM | POA: Diagnosis present

## 2024-02-22 DIAGNOSIS — Z79899 Other long term (current) drug therapy: Secondary | ICD-10-CM | POA: Diagnosis not present

## 2024-02-22 DIAGNOSIS — F1120 Opioid dependence, uncomplicated: Secondary | ICD-10-CM | POA: Insufficient documentation

## 2024-02-22 LAB — COMPREHENSIVE METABOLIC PANEL WITH GFR
ALT: 46 U/L — ABNORMAL HIGH (ref 0–44)
AST: 57 U/L — ABNORMAL HIGH (ref 15–41)
Albumin: 3.8 g/dL (ref 3.5–5.0)
Alkaline Phosphatase: 204 U/L — ABNORMAL HIGH (ref 38–126)
Anion gap: 13 (ref 5–15)
BUN: 48 mg/dL — ABNORMAL HIGH (ref 6–20)
CO2: 22 mmol/L (ref 22–32)
Calcium: 9 mg/dL (ref 8.9–10.3)
Chloride: 103 mmol/L (ref 98–111)
Creatinine, Ser: 3.22 mg/dL — ABNORMAL HIGH (ref 0.61–1.24)
GFR, Estimated: 24 mL/min — ABNORMAL LOW
Glucose, Bld: 99 mg/dL (ref 70–99)
Potassium: 4.5 mmol/L (ref 3.5–5.1)
Sodium: 138 mmol/L (ref 135–145)
Total Bilirubin: 0.9 mg/dL (ref 0.0–1.2)
Total Protein: 7.4 g/dL (ref 6.5–8.1)

## 2024-02-22 LAB — CBC WITH DIFFERENTIAL/PLATELET
Abs Immature Granulocytes: 0.01 K/uL (ref 0.00–0.07)
Basophils Absolute: 0 K/uL (ref 0.0–0.1)
Basophils Relative: 0 %
Eosinophils Absolute: 0 K/uL (ref 0.0–0.5)
Eosinophils Relative: 0 %
HCT: 19.3 % — ABNORMAL LOW (ref 39.0–52.0)
Hemoglobin: 6.7 g/dL — CL (ref 13.0–17.0)
Immature Granulocytes: 0 %
Lymphocytes Relative: 43 %
Lymphs Abs: 2.9 K/uL (ref 0.7–4.0)
MCH: 42.1 pg — ABNORMAL HIGH (ref 26.0–34.0)
MCHC: 34.7 g/dL (ref 30.0–36.0)
MCV: 121.4 fL — ABNORMAL HIGH (ref 80.0–100.0)
Monocytes Absolute: 0.2 K/uL (ref 0.1–1.0)
Monocytes Relative: 4 %
Neutro Abs: 3.6 K/uL (ref 1.7–7.7)
Neutrophils Relative %: 53 %
Platelets: 188 K/uL (ref 150–400)
RBC: 1.59 MIL/uL — ABNORMAL LOW (ref 4.22–5.81)
RDW: 25.2 % — ABNORMAL HIGH (ref 11.5–15.5)
Smear Review: NORMAL
WBC: 6.8 K/uL (ref 4.0–10.5)
nRBC: 1.3 % — ABNORMAL HIGH (ref 0.0–0.2)

## 2024-02-22 LAB — RETICULOCYTES
Immature Retic Fract: 30.8 % — ABNORMAL HIGH (ref 2.3–15.9)
RBC.: 1.58 MIL/uL — ABNORMAL LOW (ref 4.22–5.81)
Retic Count, Absolute: 95.1 K/uL (ref 19.0–186.0)
Retic Ct Pct: 6 % — ABNORMAL HIGH (ref 0.4–3.1)

## 2024-02-22 LAB — LACTATE DEHYDROGENASE: LDH: 249 U/L — ABNORMAL HIGH (ref 105–235)

## 2024-02-22 LAB — PREPARE RBC (CROSSMATCH)

## 2024-02-22 MED ORDER — POLYETHYLENE GLYCOL 3350 17 G PO PACK: 17.0000 g | PACK | Freq: Every day | ORAL | Status: DC | PRN

## 2024-02-22 MED ORDER — SODIUM CHLORIDE 0.45 % IV SOLN: INTRAVENOUS | Status: DC

## 2024-02-22 MED ORDER — HYDROMORPHONE 1 MG/ML IV SOLN
INTRAVENOUS | Status: DC
  Administered 2024-02-22: 30 mg via INTRAVENOUS
  Administered 2024-02-22: 8.5 mg via INTRAVENOUS
  Filled 2024-02-22: qty 30

## 2024-02-22 MED ORDER — SENNOSIDES-DOCUSATE SODIUM 8.6-50 MG PO TABS: 1.0000 | ORAL_TABLET | Freq: Two times a day (BID) | ORAL | Status: DC

## 2024-02-22 MED ORDER — DIPHENHYDRAMINE HCL 25 MG PO CAPS: 25.0000 mg | ORAL_CAPSULE | ORAL | Status: DC | PRN

## 2024-02-22 MED ORDER — ONDANSETRON HCL 4 MG/2ML IJ SOLN: 4.0000 mg | Freq: Four times a day (QID) | INTRAMUSCULAR | Status: DC | PRN

## 2024-02-22 MED ORDER — SODIUM CHLORIDE 0.9% FLUSH: 9.0000 mL | INTRAVENOUS | Status: DC | PRN

## 2024-02-22 MED ORDER — SODIUM CHLORIDE 0.9% IV SOLUTION: Freq: Once | INTRAVENOUS | Status: DC

## 2024-02-22 MED ORDER — ACETAMINOPHEN 500 MG PO TABS
1000.0000 mg | ORAL_TABLET | Freq: Once | ORAL | Status: AC
  Administered 2024-02-22: 1000 mg via ORAL
  Filled 2024-02-22: qty 2

## 2024-02-22 MED ORDER — NALOXONE HCL 0.4 MG/ML IJ SOLN: 0.4000 mg | INTRAMUSCULAR | Status: DC | PRN

## 2024-02-22 NOTE — H&P (Signed)
 [facility] History and Physical  Ronda A Brunilda Raddle. FMW:[id number] DOB: Aug 26, 1980 DOA: 02/22/2024  PCP: [name], NP   Chief Complaint: No chief complaint on file.   HPI: [name] [name]. is a 44 y.o. male with history of sickle cell disease, Chronic pain syndrome, opiate dependence and tolerance, anemia of chronic disease, and CKD stage IV who presented to the day hospital with major complaints of generalized body pain but primarily in his lower extremities and back, rates pain at 10/10. He describes this pain as constant and achy and throbbing.  Patient is attributing pain to change in weather.  Systemic Review: General: The patient denies anorexia, fever, weight loss Cardiac: Denies chest pain, syncope, palpitations, pedal edema  Respiratory: Denies cough, shortness of breath, wheezing GI: Denies severe indigestion/heartburn, abdominal pain, nausea, vomiting, diarrhea and constipation GU: Denies hematuria, incontinence, dysuria  Musculoskeletal: Generalized body tenderness.   Skin: Denies suspicious skin lesions Neurologic: Denies focal weakness or numbness, change in vision  Past Medical History:  Diagnosis Date   Abscess of right iliac fossa 09/24/2018   required Perc Drain 09/24/2018   Arachnoid Cyst of brain bilaterally    2 really small ones in the back of my head; inside; saw them w/MRI (09/25/2012)   Bacterial pneumonia ~ 2012   caught it here in the hospital (09/25/2012)   Chronic kidney disease    from my sickle cell (09/25/2012)   CKD (chronic kidney disease), stage II    Colitis 04/19/2017   CT scan abd/ pelvis   GERD (gastroesophageal reflux disease)    after I eat alot of spicey foods (09/25/2012)   Gynecomastia, male 07/10/2012   History of blood transfusion    always related to sickle cell crisis (09/25/2012)   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   Noted in noted from Hematology notes at [name]    Migraines    take RX qd to prevent them (09/25/2012)   Opioid dependence with withdrawal (HCC) 08/30/2012   Renal insufficiency    Sickle cell anemia (HCC)    Sickle cell crisis (HCC) 09/25/2012   Sickle cell nephropathy (HCC) 07/10/2012   Sinus tachycardia    Tachycardia with heart rate 121-140 beats per minute with ambulation 08/04/2016    Past Surgical History:  Procedure Laterality Date   ABCESS DRAINAGE     CHOLECYSTECTOMY  ~ 2012   COLONOSCOPY N/A 04/23/2017   Procedure: COLONOSCOPY;  Surgeon: [name], MD;  Location: [facility];  Service: Endoscopy;  Laterality: N/A;   IR FLUORO GUIDE CV LINE RIGHT  12/17/2016   IR FLUORO GUIDE CV LINE RIGHT  02/07/2021   IR RADIOLOGIST EVAL & MGMT  10/02/2018   IR RADIOLOGIST EVAL & MGMT  10/15/2018   IR REMOVAL TUN CV CATH W/O FL  12/21/2016   IR REMOVAL TUN CV CATH W/O FL  02/11/2021   IR US  GUIDE VASC ACCESS RIGHT  12/17/2016   IR US  GUIDE VASC ACCESS RIGHT  02/07/2021   spleenectomy      Allergies  Allergen Reactions   Nsaids Other (See Comments)    Kidney disease   Morphine  Other (See Comments)    -Really bad headaches    Family History  Problem Relation Age of Onset   Breast cancer Mother       Prior to Admission medications   Medication Sig Start Date End Date Taking? Authorizing Provider  amitriptyline  (ELAVIL ) 50 MG tablet Take 100 mg by mouth at bedtime. 09/20/23  [provider]  calcitRIOL  (ROCALTROL ) 0.25 MCG capsule Take 0.25 mcg by mouth daily with breakfast. 11/16/20   [provider]  Deferiprone , Twice Daily, (FERRIPROX  TWICE-A-DAY) 1000 MG TABS Take 3,000 mg by mouth daily. 05/01/22   Tilford Bertram HERO, FNP  folic acid  (FOLVITE ) 1 MG tablet TAKE 1 TABLET BY MOUTH EVERY DAY 03/22/20   Hollis, Lachina M, FNP  hydroxyurea  (HYDREA ) 500 MG capsule Take 1 capsule by mouth in the morning on Sun/Tues/Wed/Thurs/Sat and 2 capsules on Mon/Fri 01/25/23   Nichols, [name], NP  oxyCODONE  (OXYCONTIN ) 15 mg 12 hr  tablet Take 1 tablet (15 mg) by mouth every 12 hours. 11/18/23 12/24/23  Sim Emery CROME, MD  Oxycodone  HCl 10 MG TABS Take 1 tablet (10 mg total) by mouth See admin instructions for 15 days. Take 10 mg by mouth every 6 hours 11/29/23 12/14/23  Nichols, [name], NP  sodium bicarbonate  650 MG tablet Take 1,950 mg by mouth 3 (three) times daily. 11/07/18   [provider]  sodium zirconium cyclosilicate  (LOKELMA ) 10 g PACK packet Take 10 g by mouth daily. Patient not taking: Reported on 12/04/2023 04/21/23 04/22/24  [provider]  triamcinolone  cream (KENALOG ) 0.1 % Apply 1 Application topically 2 (two) times daily. 12/04/23   Paseda, Folashade R, FNP  TYLENOL  8 HOUR ARTHRITIS PAIN 650 MG CR tablet Take 1,300 mg by mouth as needed for pain.    [provider]     Physical Exam: Vitals:   02/22/24 0956 02/22/24 1055 02/22/24 1231 02/22/24 1518  BP: 101/70  116/82 124/69  Pulse: 84  84 91  Resp: 16 10 12 10   Temp: 97.8 F (36.6 C)     TempSrc: Temporal     SpO2: 100%  97% 100%      General: Alert, awake, afebrile, anicteric, not in obvious distress HEENT: Normocephalic and Atraumatic, Mucous membranes pink                PERRLA; EOM intact; No scleral icterus,                 Nares: Patent, Oropharynx: Clear, Fair Dentition                 Neck: FROM, no cervical lymphadenopathy, thyromegaly, carotid bruit or JVD;  CHEST WALL: No tenderness  CHEST: Normal respiration, clear to auscultation bilaterally  HEART: Regular rate and rhythm; no murmurs rubs or gallops  BACK: No kyphosis or scoliosis; no CVA tenderness  ABDOMEN: Positive Bowel Sounds, soft, non-tender; no masses, no organomegaly EXTREMITIES: No cyanosis, clubbing, or edema SKIN:  no rash or ulceration  CNS: Alert and Oriented x 4, Nonfocal exam, CN 2-12 intact  Labs on Admission:  Basic Metabolic Panel: Recent Labs  Lab 02/22/24 0930  NA 138  K 4.5  CL 103  CO2 22  GLUCOSE 99  BUN 48*   CREATININE 3.22*  CALCIUM  9.0    Liver Function Tests: Recent Labs  Lab 02/22/24 0930  AST 57*  ALT 46*  ALKPHOS 204*  BILITOT 0.9  PROT 7.4  ALBUMIN  3.8    No results for input(s): LIPASE, AMYLASE in the last 168 hours. No results for input(s): AMMONIA in the last 168 hours. CBC: Recent Labs  Lab 02/22/24 0930  WBC 6.8  NEUTROABS 3.6  HGB 6.7*  HCT 19.3*  MCV 121.4*  PLT 188    Cardiac Enzymes: No results for input(s): CKTOTAL, CKMB, CKMBINDEX, TROPONINI in the last 168 hours.  BNP (last 3 results) No results for input(s): BNP in the last 8760 hours.  ProBNP (last 3 results) No results for input(s): PROBNP in the last 8760 hours.  CBG: No results for input(s): GLUCAP in the last 168 hours.   Assessment/Plan Principal Problem:   Sickle cell anemia with pain (HCC)  Admits to the Day Hospital for extended observation IVF 0.45% Saline @ 125 mls/hour Weight based Dilaudid  PCA started within 30 minutes of admission Acetaminophen  1000 mg x 1 dose Labs: CBCD, CMP, Retic Count and LDH Monitor vitals very closely, Re-evaluate pain scale every hour 2 L of Oxygen  by Jerome Patient will be re-evaluated for pain in the context of function and relationship to baseline as care progresses. If no significant relieve from pain (remains above 5/10) will transfer patient to inpatient services for further evaluation and management  Code Status: Full  Family Communication: None  DVT Prophylaxis: Ambulate as tolerated   Time spent: 90 Minutes  Homer CHRISTELLA Cover NP  If 7PM-7AM,02/22/2024, 3:33 PM

## 2024-02-22 NOTE — Discharge Instructions (Signed)
 [name] [name]

## 2024-02-22 NOTE — Progress Notes (Signed)
 Patient admitted to the day hospital for sickle cell pain. Initially, patient reported bilateral legs and hand pain rated 10/10. Labs drawn and Hemoglobin 6.7. Provider notified and patient Type & Screened for 1 unit PRBCs. Patient unable to receive transfusion today due to timing so patient scheduled for transfusion on next Monday. For pain management, patient placed on Dilaudid  PCA, given PO Tylenol  and hydrated with IV fluids. At discharge, patient rated pain at 5/10. Vital signs stable. AVS offered but patient declined. Patient to come back on Monday for blood transfusion. Patient advised to keep blue blood bracelet on his wrist until after transfusion. Patient alert, oriented and ambulatory at discharge.

## 2024-02-22 NOTE — Discharge Summary (Signed)
 Physician Discharge Summary  [name] [name]. FMW:[id number] DOB: [birth year] DOA: 02/22/2024  PCP: [name], NP  Admit date: 02/22/2024  Discharge date: 02/22/2024  Time spent: 30 minutes  Discharge Diagnoses:  Principal Problem:   Sickle cell anemia with pain (HCC)   Discharge Condition: Stable  Diet recommendation: Regular  History of present illness:  [name] [name]. is a 44 y.o. male with history of sickle cell disease, Chronic pain syndrome, opiate dependence and tolerance, anemia of chronic disease, and CKD stage IV who presented to the day hospital with major complaints of generalized body pain but primarily in his lower extremities and back, rates pain at 10/10. He describes this pain as constant and achy and throbbing.  Patient is attributing pain to change in weather.    Day hospital Course:  [name]. was admitted to the day hospital with sickle cell painful crisis. Patient was treated with IV fluid, weight based IV Dilaudid  PCA, clinician assisted doses as deemed appropriate, and other adjunct therapies per sickle cell pain management protocol. Kenyan showed improvement symptomatically, pain improved from 10/10 to 5/10 at the time of discharge. Patient was discharged home in a hemodynamically stable condition. Jamauri will follow-up at the clinic as previously scheduled, continue with home medications as per prior to admission.  Discharge Instructions We discussed the need for good hydration, monitoring of hydration status, avoidance of heat, cold, stress, and infection triggers. We discussed the need to be compliant with taking Hydrea  and other home medications. [name] was reminded of the need to seek medical attention immediately if any symptom of bleeding, anemia, or infection occurs.  Discharge Exam: Vitals:   02/22/24 1231 02/22/24 1518  BP: 116/82 124/69  Pulse: 84 91  Resp: 12 10  Temp:    SpO2: 97% 100%    General appearance:  alert, cooperative and no distress Eyes: conjunctivae/corneas clear. PERRL, EOM's intact. Fundi benign. Neck: no adenopathy, no carotid bruit, no JVD, supple, symmetrical, trachea midline and thyroid  not enlarged, symmetric, no tenderness/mass/nodules Back: symmetric, no curvature. ROM normal. No CVA tenderness. Resp: clear to auscultation bilaterally Chest wall: no tenderness Cardio: regular rate and rhythm, S1, S2 normal, no murmur, click, rub or gallop GI: soft, non-tender; bowel sounds normal; no masses,  no organomegaly Extremities: extremities normal, atraumatic, no cyanosis or edema Pulses: 2+ and symmetric Skin: Skin color, texture, turgor normal. No rashes or lesions Neurologic: Grossly normal  Discharge Instructions     Call MD for:  persistant nausea and vomiting   Complete by: As directed    Call MD for:  severe uncontrolled pain   Complete by: As directed    Call MD for:  temperature >100.4   Complete by: As directed    Increase activity slowly   Complete by: As directed       Allergies as of 02/22/2024       Reactions   Nsaids Other (See Comments)   Kidney disease   Morphine  Other (See Comments)   -Really bad headaches        Medication List     TAKE these medications    amitriptyline  50 MG tablet Commonly known as: ELAVIL  Take 100 mg by mouth at bedtime.   calcitRIOL  0.25 MCG capsule Commonly known as: ROCALTROL  Take 0.25 mcg by mouth daily with breakfast.   doxycycline  100 MG tablet Commonly known as: VIBRA -TABS Take 1 tablet (100 mg total) by mouth 2 (two) times daily.   Ferriprox  Twice-A-Day 1000 MG  Tabs Generic drug: Deferiprone  (Twice Daily) Take 2,000 mg by mouth daily.   folic acid  1 MG tablet Commonly known as: FOLVITE  Take 1 tablet (1 mg total) by mouth daily.   hydroxyurea  500 MG capsule Commonly known as: HYDREA  Take 1 capsule by mouth in the morning on Sun/Tues/Wed/Thurs/Sat and 2 capsules on Mon/Fri   oxyCODONE  15 mg 12 hr  tablet Commonly known as: OXYCONTIN  Take 1 tablet (15 mg total) by mouth every 12 (twelve) hours.   Oxycodone  HCl 10 MG Tabs Take 1 tablet (10 mg total) by mouth every 4 (four) hours as needed.   sodium bicarbonate  650 MG tablet Take 1,950 mg by mouth 3 (three) times daily.   sodium zirconium cyclosilicate  10 g Pack packet Commonly known as: LOKELMA  Take 10 g by mouth daily.   Tylenol  8 Hour Arthritis Pain 650 MG CR tablet Generic drug: acetaminophen  Take 1,300 mg by mouth as needed for pain.       Allergies[1]   Significant Diagnostic Studies: DG Chest Port 1 View Result Date: 02/05/2024 EXAM: 1 VIEW(S) XRAY OF THE CHEST 02/05/2024 08:07:00 AM COMPARISON: 11/16/2023 CLINICAL HISTORY: CP FINDINGS: LUNGS AND PLEURA: Reticulation in the left lung base, probable scarring. No pneumothorax. No pleural effusion. HEART AND MEDIASTINUM: No acute abnormality of the cardiac and mediastinal silhouettes. BONES AND SOFT TISSUES: Similar osteonecrosis in both humeral heads. No acute osseous abnormality. IMPRESSION: 1. No acute cardiopulmonary abnormality. Electronically signed by: Rogelia Myers MD 02/05/2024 08:25 AM EST RP Workstation: [id number]    Signed:  Homer CHRISTELLA Cover NP  02/22/2024, 3:36 PM     [1]  Allergies Allergen Reactions   Nsaids Other (See Comments)    Kidney disease   Morphine  Other (See Comments)    -Really bad headaches

## 2024-02-22 NOTE — Telephone Encounter (Signed)
 Patient called requesting to come to the day hospital for pain management. Patient reports bilateral legs and hand pain rated 10/10. Patient also reports some light-headedness. Reports taking Oxyodone 15 mg and Oxyconin 10 mg at 5:00 am. Denies fever, chest pain, nausea, vomiting, diarrhea, abdominal pain and priapism. Admits to having transportation without driving self. [name], NP notified and advised that patient can come to the day hospital for pain management. Patient advised and expresses an understanding.

## 2024-02-23 ENCOUNTER — Other Ambulatory Visit: Payer: Self-pay

## 2024-02-23 ENCOUNTER — Emergency Department (HOSPITAL_COMMUNITY): Admission: EM | Admit: 2024-02-23 | Discharge: 2024-02-23 | Disposition: A | Discharge status: Home / Self Care

## 2024-02-23 ENCOUNTER — Emergency Department (HOSPITAL_COMMUNITY)

## 2024-02-23 DIAGNOSIS — F17200 Nicotine dependence, unspecified, uncomplicated: Secondary | ICD-10-CM | POA: Diagnosis not present

## 2024-02-23 DIAGNOSIS — N189 Chronic kidney disease, unspecified: Secondary | ICD-10-CM | POA: Insufficient documentation

## 2024-02-23 DIAGNOSIS — N179 Acute kidney failure, unspecified: Secondary | ICD-10-CM | POA: Insufficient documentation

## 2024-02-23 DIAGNOSIS — D649 Anemia, unspecified: Secondary | ICD-10-CM | POA: Insufficient documentation

## 2024-02-23 DIAGNOSIS — R000 Tachycardia, unspecified: Secondary | ICD-10-CM | POA: Insufficient documentation

## 2024-02-23 DIAGNOSIS — R42 Dizziness and giddiness: Secondary | ICD-10-CM | POA: Diagnosis present

## 2024-02-23 LAB — CBC WITH DIFFERENTIAL/PLATELET
Basophils Absolute: 0 K/uL (ref 0.0–0.1)
Basophils Relative: 0 %
Eosinophils Absolute: 0.1 K/uL (ref 0.0–0.5)
Eosinophils Relative: 1 %
HCT: 21.6 % — ABNORMAL LOW (ref 39.0–52.0)
Hemoglobin: 7.4 g/dL — ABNORMAL LOW (ref 13.0–17.0)
Lymphocytes Relative: 34 %
Lymphs Abs: 2.8 K/uL (ref 0.7–4.0)
MCH: 42 pg — ABNORMAL HIGH (ref 26.0–34.0)
MCHC: 34.3 g/dL (ref 30.0–36.0)
MCV: 122.7 fL — ABNORMAL HIGH (ref 80.0–100.0)
Monocytes Absolute: 0.2 K/uL (ref 0.1–1.0)
Monocytes Relative: 2 %
Neutro Abs: 5.2 K/uL (ref 1.7–7.7)
Neutrophils Relative %: 63 %
Platelets: 205 K/uL (ref 150–400)
RBC: 1.76 MIL/uL — ABNORMAL LOW (ref 4.22–5.81)
RDW: 25.5 % — ABNORMAL HIGH (ref 11.5–15.5)
WBC: 8.2 K/uL (ref 4.0–10.5)
nRBC: 0.9 % — ABNORMAL HIGH (ref 0.0–0.2)

## 2024-02-23 LAB — I-STAT CHEM 8, ED
BUN: 41 mg/dL — ABNORMAL HIGH (ref 6–20)
Calcium, Ion: 1.14 mmol/L — ABNORMAL LOW (ref 1.15–1.40)
Chloride: 107 mmol/L (ref 98–111)
Creatinine, Ser: 4.9 mg/dL — ABNORMAL HIGH (ref 0.61–1.24)
Glucose, Bld: 104 mg/dL — ABNORMAL HIGH (ref 70–99)
HCT: 22 % — ABNORMAL LOW (ref 39.0–52.0)
Hemoglobin: 7.5 g/dL — ABNORMAL LOW (ref 13.0–17.0)
Potassium: 4.4 mmol/L (ref 3.5–5.1)
Sodium: 139 mmol/L (ref 135–145)
TCO2: 24 mmol/L (ref 22–32)

## 2024-02-23 LAB — COMPREHENSIVE METABOLIC PANEL WITH GFR
ALT: 43 U/L (ref 0–44)
AST: 45 U/L — ABNORMAL HIGH (ref 15–41)
Albumin: 4.2 g/dL (ref 3.5–5.0)
Alkaline Phosphatase: 204 U/L — ABNORMAL HIGH (ref 38–126)
Anion gap: 11 (ref 5–15)
BUN: 40 mg/dL — ABNORMAL HIGH (ref 6–20)
CO2: 25 mmol/L (ref 22–32)
Calcium: 9.6 mg/dL (ref 8.9–10.3)
Chloride: 103 mmol/L (ref 98–111)
Creatinine, Ser: 2.89 mg/dL — ABNORMAL HIGH (ref 0.61–1.24)
GFR, Estimated: 27 mL/min — ABNORMAL LOW
Glucose, Bld: 99 mg/dL (ref 70–99)
Potassium: 4.4 mmol/L (ref 3.5–5.1)
Sodium: 139 mmol/L (ref 135–145)
Total Bilirubin: 0.7 mg/dL (ref 0.0–1.2)
Total Protein: 7.7 g/dL (ref 6.5–8.1)

## 2024-02-23 LAB — CBG MONITORING, ED: Glucose-Capillary: 85 mg/dL (ref 70–99)

## 2024-02-23 LAB — RETICULOCYTES
Immature Retic Fract: 27.5 % — ABNORMAL HIGH (ref 2.3–15.9)
RBC.: 1.73 MIL/uL — ABNORMAL LOW (ref 4.22–5.81)
Retic Count, Absolute: 104.1 K/uL (ref 19.0–186.0)
Retic Ct Pct: 6 % — ABNORMAL HIGH (ref 0.4–3.1)

## 2024-02-23 LAB — TYPE AND SCREEN
ABO/RH(D): O POS
Antibody Screen: NEGATIVE

## 2024-02-23 MED ORDER — ONDANSETRON HCL 4 MG/2ML IJ SOLN
4.0000 mg | INTRAMUSCULAR | Status: DC | PRN
  Administered 2024-02-23: 4 mg via INTRAVENOUS
  Filled 2024-02-23: qty 2

## 2024-02-23 MED ORDER — SODIUM CHLORIDE 0.9 % IV SOLN
12.5000 mg | Freq: Once | INTRAVENOUS | Status: AC
  Administered 2024-02-23: 12.5 mg via INTRAVENOUS
  Filled 2024-02-23: qty 12.5

## 2024-02-23 MED ORDER — HYDROMORPHONE HCL 1 MG/ML IJ SOLN
2.0000 mg | Freq: Once | INTRAMUSCULAR | Status: AC
  Administered 2024-02-23: 2 mg via INTRAVENOUS
  Filled 2024-02-23: qty 2

## 2024-02-23 MED ORDER — SODIUM CHLORIDE 0.9 % IV BOLUS
1000.0000 mL | Freq: Once | INTRAVENOUS | Status: AC
  Administered 2024-02-23: 1000 mL via INTRAVENOUS

## 2024-02-23 MED ORDER — HYDROMORPHONE HCL 1 MG/ML IJ SOLN
2.0000 mg | INTRAMUSCULAR | Status: AC
  Administered 2024-02-23: 2 mg via INTRAVENOUS
  Filled 2024-02-23: qty 2

## 2024-02-23 NOTE — ED Triage Notes (Signed)
 Pt reports has been in [facility] x yesterday went to pain clinic yesterday and had labs done , told HGB was 6.6, pt has been experiencing dizziness. Pt came today for pain management and dizziness, is schedule for a blood transfusion Monday but unable to wait.

## 2024-02-23 NOTE — Discharge Instructions (Signed)
 It was a pleasure taking care of you today. You were seen in the Emergency Department for evaluation of dizziness and low hemoglobin. Your work-up was reassuring. Your Xray/Labs showed no acute abnormality that requires urgent transfusion or intervention at this time.  You were given some additional pain meds for a likely sickle cell crisis.  I recommend keeping your appointment as scheduled at the sickle cell clinic on Monday.  Refer to the attached documentation for further management of your symptoms.   Please return to the ER if you experience chest pain, trouble breathing, intractable nausea/vomiting or any other life threatening illnesses.

## 2024-02-23 NOTE — ED Provider Notes (Signed)
 " [place] EMERGENCY DEPARTMENT AT [facility] Provider Note   CSN: [id number] Arrival date & time: 02/23/24  1047     Patient presents with: No chief complaint on file.   [name] [name]. is a 44 y.o. male, palpable history of sickle cell, CKD, tobacco abuse, cocaine  abuse, anemia of chronic disease, who presents emergency department for evaluation of sickle cell crisis and dizziness.  Patient reports he went to the sickle cell clinic yesterday, and was told his hemoglobin was low enough to require transfusion.  He reports he is scheduled to have a blood transfusion on Monday, but felt like he could not wait.  He is continuing to endorse dizziness as well as pain.  He states he took his opioid pain medicine at home, without relief.  He denies any chest pain or headache.  He does endorse minimal shortness of breath.  Patient denies ever having a blood clot.   HPI     Prior to Admission medications  Medication Sig Start Date End Date Taking? Authorizing Provider  amitriptyline  (ELAVIL ) 50 MG tablet Take 100 mg by mouth at bedtime. 09/20/23   [provider]  calcitRIOL  (ROCALTROL ) 0.25 MCG capsule Take 0.25 mcg by mouth daily with breakfast. 11/16/20   [provider]  Deferiprone , Twice Daily, (FERRIPROX  TWICE-A-DAY) 1000 MG TABS Take 2,000 mg by mouth daily. 05/01/22   Tilford Bertram HERO, FNP  doxycycline  (VIBRA -TABS) 100 MG tablet Take 1 tablet (100 mg total) by mouth 2 (two) times daily. 01/24/24   [name], [name], MD  folic acid  (FOLVITE ) 1 MG tablet Take 1 tablet (1 mg total) by mouth daily. 01/24/24   [name], [name], MD  hydroxyurea  (HYDREA ) 500 MG capsule Take 1 capsule by mouth in the morning on Sun/Tues/Wed/Thurs/Sat and 2 capsules on Mon/Fri 01/25/23   Nichols, [name], NP  oxyCODONE  (OXYCONTIN ) 15 mg 12 hr tablet Take 1 tablet (15 mg total) by mouth every 12 (twelve) hours. 02/12/24 03/13/24  Ijaola, [name] M, NP  Oxycodone  HCl 10 MG TABS Take  1 tablet (10 mg total) by mouth every 4 (four) hours as needed. 02/12/24   Ijaola, [name] M, NP  sodium bicarbonate  650 MG tablet Take 1,950 mg by mouth 3 (three) times daily. 11/07/18   [provider]  sodium zirconium cyclosilicate  (LOKELMA ) 10 g PACK packet Take 10 g by mouth daily. 04/21/23 04/22/24  [provider]  TYLENOL  8 HOUR ARTHRITIS PAIN 650 MG CR tablet Take 1,300 mg by mouth as needed for pain.    [provider]    Allergies: Nsaids and Morphine     Review of Systems  Respiratory:  Positive for shortness of breath.   Neurological:  Positive for dizziness.    Updated Vital Signs BP 121/87   Pulse 96   Temp 97.9 F (36.6 C) (Oral)   Resp 14   SpO2 99%   Physical Exam Vitals and nursing note reviewed.  Constitutional:      Appearance: Normal appearance.  HENT:     Head: Normocephalic and atraumatic.     Mouth/Throat:     Mouth: Mucous membranes are moist.  Eyes:     General: No scleral icterus.       Right eye: No discharge.        Left eye: No discharge.     Conjunctiva/sclera: Conjunctivae normal.  Cardiovascular:     Rate and Rhythm: Regular rhythm. Tachycardia present.     Pulses: Normal pulses.  Pulmonary:  Effort: Pulmonary effort is normal.     Breath sounds: Normal breath sounds.  Abdominal:     General: There is no distension.     Tenderness: There is no abdominal tenderness.  Musculoskeletal:        General: No deformity.     Cervical back: Normal range of motion.  Skin:    General: Skin is warm and dry.     Capillary Refill: Capillary refill takes less than 2 seconds.  Neurological:     General: No focal deficit present.     Mental Status: He is alert.     Motor: No weakness.  Psychiatric:        Mood and Affect: Mood normal.     (all labs ordered are listed, but only abnormal results are displayed) Labs Reviewed  COMPREHENSIVE METABOLIC PANEL WITH GFR - Abnormal; Notable for the following components:       Result Value   BUN 40 (*)    Creatinine, Ser 2.89 (*)    AST 45 (*)    Alkaline Phosphatase 204 (*)    GFR, Estimated 27 (*)    All other components within normal limits  CBC WITH DIFFERENTIAL/PLATELET - Abnormal; Notable for the following components:   RBC 1.76 (*)    Hemoglobin 7.4 (*)    HCT 21.6 (*)    MCV 122.7 (*)    MCH 42.0 (*)    RDW 25.5 (*)    nRBC 0.9 (*)    All other components within normal limits  RETICULOCYTES - Abnormal; Notable for the following components:   Retic Ct Pct 6.0 (*)    RBC. 1.73 (*)    Immature Retic Fract 27.5 (*)    All other components within normal limits  I-STAT CHEM 8, ED - Abnormal; Notable for the following components:   BUN 41 (*)    Creatinine, Ser 4.90 (*)    Glucose, Bld 104 (*)    Calcium , Ion 1.14 (*)    Hemoglobin 7.5 (*)    HCT 22.0 (*)    All other components within normal limits  CBC  URINALYSIS, ROUTINE W REFLEX MICROSCOPIC  CBG MONITORING, ED  TYPE AND SCREEN    EKG: None  Radiology: DG Chest 2 View Result Date: 02/23/2024 CLINICAL DATA:  Shortness of breath, sickle cell. EXAM: CHEST - 2 VIEW COMPARISON:  02/05/2024 and CT chest 02/27/2017. FINDINGS: Trachea is midline. Heart size normal. Lungs are clear. No pleural fluid. Osseous changes of sickle cell, including avascular necrosis of the humeral heads. IMPRESSION: 1. No acute findings. 2. Osseous changes of sickle cell, including avascular necrosis of the humeral heads. Electronically Signed   By: Newell Eke M.D.   On: 02/23/2024 12:05    Procedures   Medications Ordered in the ED  ondansetron  (ZOFRAN ) injection 4 mg (4 mg Intravenous Given 02/23/24 1332)  HYDROmorphone  (DILAUDID ) injection 2 mg (2 mg Intravenous Given 02/23/24 1328)  diphenhydrAMINE  (BENADRYL ) 12.5 mg in sodium chloride  0.9 % 50 mL IVPB (0 mg Intravenous Stopped 02/23/24 1403)  sodium chloride  0.9 % bolus 1,000 mL (1,000 mLs Intravenous New Bag/Given 02/23/24 1333)  HYDROmorphone  (DILAUDID )  injection 2 mg (2 mg Intravenous Given 02/23/24 1413)                                 Medical Decision Making Amount and/or Complexity of Data Reviewed Labs: ordered. Radiology: ordered.  Risk Prescription drug management.  This patient presents to the ED for concern of dizziness and sickle cell pain, this involves an extensive number of treatment options, and is a complaint that carries with it a high risk of complications and morbidity.  Differential diagnosis includes: Sickle cell crisis, stroke, heart attack, PE, low hemoglobin  Co morbidities:  sickle cell   Additional history:  Patient was discharged from the hospital yesterday after a 1 day hospital admission for sickle cell pain crisis.  He then reports going to sickle cell clinic, and told he needed a blood.  Patient had a scheduled for Monday.  Lab Tests:  I Ordered, and personally interpreted labs.  The pertinent results include:    - Hemoglobin: 7.4, improved from 6.7 yesterday  Imaging Studies:  I ordered imaging studies including chest x-ray I independently visualized and interpreted imaging which showed no acute abnormality.  There was evidence of bilateral femoral avascular necrosis, which has been seen on prior x-rays I agree with the radiologist interpretation  Cardiac Monitoring/ECG:  The patient was maintained on a cardiac monitor.  I personally viewed and interpreted the cardiac monitored which showed an underlying rhythm of: Sinus rhythm  Medicines ordered and prescription drug management:  I ordered medication including  Medications  ondansetron  (ZOFRAN ) injection 4 mg (4 mg Intravenous Given 02/23/24 1332)  HYDROmorphone  (DILAUDID ) injection 2 mg (2 mg Intravenous Given 02/23/24 1328)  diphenhydrAMINE  (BENADRYL ) 12.5 mg in sodium chloride  0.9 % 50 mL IVPB (0 mg Intravenous Stopped 02/23/24 1403)  sodium chloride  0.9 % bolus 1,000 mL (1,000 mLs Intravenous New Bag/Given 02/23/24 1333)  HYDROmorphone   (DILAUDID ) injection 2 mg (2 mg Intravenous Given 02/23/24 1413)   for pain control Reevaluation of the patient after these medicines showed that the patient improved I have reviewed the patients home medicines and have made adjustments as needed  Test Considered:   none  Critical Interventions:   none  Consultations Obtained:  None  Problem List / ED Course:     ICD-10-CM   1. Symptomatic anemia  D64.9     2. AKI (acute kidney injury)  N17.9       MDM: 44 year old male who presents emergency department for evaluation of sickle cell pain and dizziness.  Patient was seen at the clinic yesterday, and told he needed a blood transfusion scheduled for Monday.  Labs ordered today to recheck hemoglobin as well as type and screen.  EKG and chest x-ray ordered for evaluation however shortness of breath.  EKG shows avascular necrosis of bilateral humeral heads, which is consistent with prior x-rays.  I-STAT Chem-8 does show a significant AKI as patient's creatinine is 4.90 up from his baseline.  His hemoglobin is actually improved from yesterday up to 7.5, from 6.7 when checked yesterday.  Upon reassessment, patient reports that his dizziness has improved as well as his pain.  We discussed the possibility of hospital admission, but patient prefers to go home.  I am agreeable to this plan as his hemoglobin does not require urgent transfusion and he is scheduled to be seen by the sickle cell clinic on Monday.  His vital signs are stable.  Strict return precautions have been given.  Patient is appropriate for discharge at this time.   Dispostion:  After consideration of the diagnostic results and the patients response to treatment, I feel that the patient would benefit from supportive care and sickle cell clinic follow-up.   Final diagnoses:  Symptomatic anemia  AKI (acute kidney injury)    ED Discharge  Orders     None          Torrence Marry RAMAN, PA-C 02/23/24 1518    Neysa Caron PARAS, DO 02/23/24 1525  "

## 2024-02-25 ENCOUNTER — Non-Acute Institutional Stay (HOSPITAL_COMMUNITY)

## 2024-02-26 ENCOUNTER — Other Ambulatory Visit: Payer: Self-pay

## 2024-02-26 DIAGNOSIS — D571 Sickle-cell disease without crisis: Secondary | ICD-10-CM

## 2024-02-26 LAB — BPAM RBC
Blood Product Expiration Date: 202601232359
Unit Type and Rh: 5100

## 2024-02-26 LAB — TYPE AND SCREEN
ABO/RH(D): O POS
Antibody Screen: NEGATIVE
Donor AG Type: NEGATIVE
Unit division: 0

## 2024-02-26 NOTE — Telephone Encounter (Signed)
 Please advise [facility]

## 2024-02-27 MED ORDER — OXYCODONE HCL ER 15 MG PO T12A: 15.0000 mg | EXTENDED_RELEASE_TABLET | Freq: Two times a day (BID) | ORAL | 0 refills | Status: DC

## 2024-02-27 MED ORDER — OXYCODONE HCL 10 MG PO TABS: 10.0000 mg | ORAL_TABLET | ORAL | 0 refills | Status: DC | PRN

## 2024-02-28 ENCOUNTER — Ambulatory Visit: Payer: Self-pay

## 2024-03-04 NOTE — Progress Notes (Signed)
 Sickle Cell Questionaire  1. Are you taking any blood thinners (such as asprin, xarelto, plavix, lovenox  or warfarin)? no 2. Have you had any recent surgeries or do you have upcoming surgeries? no  If yes, what surgery and when? 3. Do you have any open or healing wounds? no  4.  (Male patients only) Is there any chance you may be pregnant? no na 5. Are you driving yourself today ? yes  6. Baseline pain level:  6 7. Special considerations concerning visit today:  none 8. Any recent (within 21 days) travel into ([place], [place], [place], and [place]). no  9.  Have you signed up for [facility]?yes   If no, would you like help doing so? no

## 2024-03-11 ENCOUNTER — Emergency Department (HOSPITAL_COMMUNITY)

## 2024-03-11 ENCOUNTER — Other Ambulatory Visit: Payer: Self-pay

## 2024-03-11 ENCOUNTER — Inpatient Hospital Stay (HOSPITAL_COMMUNITY)
Admission: EM | Admit: 2024-03-11 | Discharge: 2024-03-13 | DRG: 812 | Disposition: A | Discharge status: Home / Self Care | Attending: Internal Medicine | Admitting: Internal Medicine

## 2024-03-11 DIAGNOSIS — D638 Anemia in other chronic diseases classified elsewhere: Secondary | ICD-10-CM | POA: Diagnosis present

## 2024-03-11 DIAGNOSIS — D5700 Hb-SS disease with crisis, unspecified: Secondary | ICD-10-CM | POA: Diagnosis not present

## 2024-03-11 DIAGNOSIS — N184 Chronic kidney disease, stage 4 (severe): Secondary | ICD-10-CM | POA: Diagnosis not present

## 2024-03-11 DIAGNOSIS — E875 Hyperkalemia: Secondary | ICD-10-CM | POA: Diagnosis not present

## 2024-03-11 DIAGNOSIS — R296 Repeated falls: Secondary | ICD-10-CM | POA: Diagnosis present

## 2024-03-11 DIAGNOSIS — F1120 Opioid dependence, uncomplicated: Secondary | ICD-10-CM | POA: Diagnosis present

## 2024-03-11 DIAGNOSIS — Z9081 Acquired absence of spleen: Secondary | ICD-10-CM

## 2024-03-11 DIAGNOSIS — G894 Chronic pain syndrome: Secondary | ICD-10-CM | POA: Diagnosis not present

## 2024-03-11 DIAGNOSIS — D72829 Elevated white blood cell count, unspecified: Secondary | ICD-10-CM | POA: Diagnosis present

## 2024-03-11 DIAGNOSIS — F17200 Nicotine dependence, unspecified, uncomplicated: Secondary | ICD-10-CM | POA: Diagnosis present

## 2024-03-11 DIAGNOSIS — Z79899 Other long term (current) drug therapy: Secondary | ICD-10-CM

## 2024-03-11 DIAGNOSIS — M25571 Pain in right ankle and joints of right foot: Secondary | ICD-10-CM | POA: Diagnosis present

## 2024-03-11 DIAGNOSIS — D631 Anemia in chronic kidney disease: Secondary | ICD-10-CM | POA: Diagnosis present

## 2024-03-11 DIAGNOSIS — D571 Sickle-cell disease without crisis: Secondary | ICD-10-CM

## 2024-03-11 DIAGNOSIS — Z9049 Acquired absence of other specified parts of digestive tract: Secondary | ICD-10-CM

## 2024-03-11 DIAGNOSIS — Z803 Family history of malignant neoplasm of breast: Secondary | ICD-10-CM

## 2024-03-11 DIAGNOSIS — K219 Gastro-esophageal reflux disease without esophagitis: Secondary | ICD-10-CM | POA: Diagnosis present

## 2024-03-11 DIAGNOSIS — Z87891 Personal history of nicotine dependence: Secondary | ICD-10-CM

## 2024-03-11 LAB — COMPREHENSIVE METABOLIC PANEL WITH GFR
ALT: 45 U/L — ABNORMAL HIGH (ref 0–44)
AST: 51 U/L — ABNORMAL HIGH (ref 15–41)
Albumin: 3.9 g/dL (ref 3.5–5.0)
Alkaline Phosphatase: 227 U/L — ABNORMAL HIGH (ref 38–126)
Anion gap: 12 (ref 5–15)
BUN: 46 mg/dL — ABNORMAL HIGH (ref 6–20)
CO2: 23 mmol/L (ref 22–32)
Calcium: 9.1 mg/dL (ref 8.9–10.3)
Chloride: 105 mmol/L (ref 98–111)
Creatinine, Ser: 3.21 mg/dL — ABNORMAL HIGH (ref 0.61–1.24)
GFR, Estimated: 24 mL/min — ABNORMAL LOW
Glucose, Bld: 89 mg/dL (ref 70–99)
Potassium: 5.2 mmol/L — ABNORMAL HIGH (ref 3.5–5.1)
Sodium: 139 mmol/L (ref 135–145)
Total Bilirubin: 0.6 mg/dL (ref 0.0–1.2)
Total Protein: 7.6 g/dL (ref 6.5–8.1)

## 2024-03-11 LAB — CBC WITH DIFFERENTIAL/PLATELET
Abs Immature Granulocytes: 0.03 10*3/uL (ref 0.00–0.07)
Basophils Absolute: 0 10*3/uL (ref 0.0–0.1)
Basophils Relative: 0 %
Eosinophils Absolute: 0 10*3/uL (ref 0.0–0.5)
Eosinophils Relative: 1 %
HCT: 23 % — ABNORMAL LOW (ref 39.0–52.0)
Hemoglobin: 7.8 g/dL — ABNORMAL LOW (ref 13.0–17.0)
Immature Granulocytes: 0 %
Lymphocytes Relative: 64 %
Lymphs Abs: 5.6 10*3/uL — ABNORMAL HIGH (ref 0.7–4.0)
MCH: 44.3 pg — ABNORMAL HIGH (ref 26.0–34.0)
MCHC: 33.9 g/dL (ref 30.0–36.0)
MCV: 130.7 fL — ABNORMAL HIGH (ref 80.0–100.0)
Monocytes Absolute: 0.5 10*3/uL (ref 0.1–1.0)
Monocytes Relative: 6 %
Neutro Abs: 2.6 10*3/uL (ref 1.7–7.7)
Neutrophils Relative %: 29 %
Platelets: UNDETERMINED 10*3/uL (ref 150–400)
RBC: 1.76 MIL/uL — ABNORMAL LOW (ref 4.22–5.81)
RDW: 23.4 % — ABNORMAL HIGH (ref 11.5–15.5)
WBC: 8.8 10*3/uL (ref 4.0–10.5)
nRBC: 0.6 % — ABNORMAL HIGH (ref 0.0–0.2)

## 2024-03-11 LAB — RETICULOCYTES
Immature Retic Fract: 28.2 % — ABNORMAL HIGH (ref 2.3–15.9)
RBC.: 1.77 MIL/uL — ABNORMAL LOW (ref 4.22–5.81)
Retic Count, Absolute: 128.1 10*3/uL (ref 19.0–186.0)
Retic Ct Pct: 7.2 % — ABNORMAL HIGH (ref 0.4–3.1)

## 2024-03-11 LAB — I-STAT CG4 LACTIC ACID, ED: Lactic Acid, Venous: 1.3 mmol/L (ref 0.5–1.9)

## 2024-03-11 LAB — TROPONIN T, HIGH SENSITIVITY: Troponin T High Sensitivity: 15 ng/L (ref 0–19)

## 2024-03-11 MED ORDER — CALCITRIOL 0.25 MCG PO CAPS
0.2500 ug | ORAL_CAPSULE | Freq: Every day | ORAL | Status: DC
  Administered 2024-03-12 – 2024-03-13 (×2): 0.25 ug via ORAL
  Filled 2024-03-11 (×2): qty 1

## 2024-03-11 MED ORDER — POLYETHYLENE GLYCOL 3350 17 G PO PACK: 17.0000 g | PACK | Freq: Every day | ORAL | Status: DC | PRN

## 2024-03-11 MED ORDER — FOLIC ACID 1 MG PO TABS
1.0000 mg | ORAL_TABLET | Freq: Every day | ORAL | Status: DC
  Administered 2024-03-12 – 2024-03-13 (×2): 1 mg via ORAL
  Filled 2024-03-11 (×2): qty 1

## 2024-03-11 MED ORDER — HYDROXYUREA 500 MG PO CAPS
500.0000 mg | ORAL_CAPSULE | ORAL | Status: DC
  Administered 2024-03-13: 1000 mg via ORAL
  Filled 2024-03-11: qty 2

## 2024-03-11 MED ORDER — SODIUM CHLORIDE 0.9% FLUSH: 9.0000 mL | INTRAVENOUS | Status: DC | PRN

## 2024-03-11 MED ORDER — HYDROMORPHONE HCL 1 MG/ML IJ SOLN: 2.0000 mg | INTRAMUSCULAR | Status: DC

## 2024-03-11 MED ORDER — SODIUM BICARBONATE 650 MG PO TABS
1950.0000 mg | ORAL_TABLET | Freq: Three times a day (TID) | ORAL | Status: DC
  Administered 2024-03-11 – 2024-03-13 (×5): 1950 mg via ORAL
  Filled 2024-03-11 (×6): qty 3

## 2024-03-11 MED ORDER — HYDROMORPHONE HCL 1 MG/ML IJ SOLN
2.0000 mg | INTRAMUSCULAR | Status: AC
  Administered 2024-03-11: 2 mg via INTRAVENOUS
  Filled 2024-03-11: qty 2

## 2024-03-11 MED ORDER — SODIUM CHLORIDE 0.9 % IV BOLUS
1000.0000 mL | Freq: Once | INTRAVENOUS | Status: AC
  Administered 2024-03-11: 1000 mL via INTRAVENOUS

## 2024-03-11 MED ORDER — OXYCODONE HCL 5 MG PO TABS: 10.0000 mg | ORAL_TABLET | ORAL | Status: DC | PRN

## 2024-03-11 MED ORDER — AMITRIPTYLINE HCL 25 MG PO TABS
100.0000 mg | ORAL_TABLET | Freq: Every day | ORAL | Status: DC
  Administered 2024-03-11 – 2024-03-12 (×2): 100 mg via ORAL
  Filled 2024-03-11 (×2): qty 4

## 2024-03-11 MED ORDER — ONDANSETRON HCL 4 MG/2ML IJ SOLN: 4.0000 mg | Freq: Four times a day (QID) | INTRAMUSCULAR | Status: DC | PRN

## 2024-03-11 MED ORDER — SODIUM CHLORIDE 0.9 % IV SOLN: INTRAVENOUS | Status: DC

## 2024-03-11 MED ORDER — HYDROMORPHONE HCL 1 MG/ML IJ SOLN
2.0000 mg | INTRAMUSCULAR | Status: DC | PRN
  Administered 2024-03-11: 2 mg via INTRAVENOUS
  Filled 2024-03-11: qty 2

## 2024-03-11 MED ORDER — DIPHENHYDRAMINE HCL 25 MG PO CAPS: 25.0000 mg | ORAL_CAPSULE | ORAL | Status: DC | PRN

## 2024-03-11 MED ORDER — HYDROMORPHONE 1 MG/ML IV SOLN
INTRAVENOUS | Status: DC
  Administered 2024-03-11: 30 mg via INTRAVENOUS
  Administered 2024-03-12: 3 mg via INTRAVENOUS
  Administered 2024-03-12: 2 mg via INTRAVENOUS
  Administered 2024-03-12: 3 mg via INTRAVENOUS
  Administered 2024-03-12: 4.5 mg via INTRAVENOUS
  Administered 2024-03-12: 6.5 mg via INTRAVENOUS
  Administered 2024-03-12: 3 mg via INTRAVENOUS
  Administered 2024-03-13: 30 mg via INTRAVENOUS
  Administered 2024-03-13: 2.5 mg via INTRAVENOUS
  Filled 2024-03-11 (×2): qty 30

## 2024-03-11 MED ORDER — NALOXONE HCL 0.4 MG/ML IJ SOLN: 0.4000 mg | INTRAMUSCULAR | Status: DC | PRN

## 2024-03-11 MED ORDER — OXYCODONE HCL ER 15 MG PO T12A
15.0000 mg | EXTENDED_RELEASE_TABLET | Freq: Two times a day (BID) | ORAL | Status: DC
  Administered 2024-03-11 – 2024-03-13 (×4): 15 mg via ORAL
  Filled 2024-03-11 (×4): qty 1

## 2024-03-11 MED ORDER — SODIUM CHLORIDE 0.9 % IV SOLN
12.5000 mg | Freq: Once | INTRAVENOUS | Status: AC
  Administered 2024-03-11: 12.5 mg via INTRAVENOUS
  Filled 2024-03-11: qty 12.5

## 2024-03-11 MED ORDER — DEFERIPRONE (TWICE DAILY) 1000 MG PO TABS: 3000.0000 mg | ORAL_TABLET | Freq: Every day | ORAL | Status: DC

## 2024-03-11 MED ORDER — SENNOSIDES-DOCUSATE SODIUM 8.6-50 MG PO TABS
1.0000 | ORAL_TABLET | Freq: Two times a day (BID) | ORAL | Status: DC
  Administered 2024-03-12: 1 via ORAL
  Filled 2024-03-11 (×2): qty 1

## 2024-03-11 NOTE — ED Provider Triage Note (Signed)
 Emergency Medicine Provider Triage Evaluation Note  [name] [name]. , a 44 y.o. male  was evaluated in triage.  Pt complains of  R foot pain and ankle pain, chest pain, shortness of breath, anterior bilateral leg pain, similar to previous sickle cell crisis pain. No reported new symptoms  Has been using 15mg  and 10mg  OxyContin  without relief.     Review of Systems  Positive: N/a Negative: N/a  Physical Exam  BP 112/75   Pulse (!) 106   Temp 98.8 F (37.1 C)   Resp 20   SpO2 100%  Gen:   Awake, no distress   Resp:  Normal effort  MSK:   Moves extremities without difficulty  Other:    Medical Decision Making  Medically screening exam initiated at 5:07 PM.  Appropriate orders placed.  [name] [name]. was informed that the remainder of the evaluation will be completed by another provider, this initial triage assessment does not replace that evaluation, and the importance of remaining in the ED until their evaluation is complete.     Beola Terrall RAMAN, [place] 03/11/24 1711

## 2024-03-11 NOTE — ED Provider Notes (Signed)
 "  EMERGENCY DEPARTMENT AT [facility] Provider Note   CSN: [id number] Arrival date & time: 03/11/24  1641     Patient presents with: Sickle Cell Pain Crisis   [name] [name]. is a 44 y.o. male.   44 year old male presenting with sickle cell pain crisis.  Patient notes feeling generally fatigued today, reports that he had multiple falls out in the ice and is complaining of pain in his right ankle that is worse with dorsiflexion, reports that aside from ankle pain the remainder his symptoms are very consistent with his sickle cell pain crises.  Denies head injury or loss of consciousness.  Tried to manage his pain at home with 10 mg / 15 mg oxycodone , no relief of symptoms.  Denies chest pain, denies fever, denies shortness of breath. Followed by [facility] sickle cell clinics. Recently admitted for pain crisis 1/9.    Sickle Cell Pain Crisis      Prior to Admission medications  Medication Sig Start Date End Date Taking? Authorizing Provider  amitriptyline  (ELAVIL ) 50 MG tablet Take 100 mg by mouth at bedtime. 09/20/23   [provider]  calcitRIOL  (ROCALTROL ) 0.25 MCG capsule Take 0.25 mcg by mouth daily with breakfast. 11/16/20   [provider]  Deferiprone , Twice Daily, (FERRIPROX  TWICE-A-DAY) 1000 MG TABS Take 2,000 mg by mouth daily. 05/01/22   Tilford Bertram HERO, FNP  doxycycline  (VIBRA -TABS) 100 MG tablet Take 1 tablet (100 mg total) by mouth 2 (two) times daily. 01/24/24   [name], [name], MD  folic acid  (FOLVITE ) 1 MG tablet Take 1 tablet (1 mg total) by mouth daily. 01/24/24   [name], [name], MD  hydroxyurea  (HYDREA ) 500 MG capsule Take 1 capsule by mouth in the morning on Sun/Tues/Wed/Thurs/Sat and 2 capsules on Mon/Fri 01/25/23   Nichols, [name], NP  oxyCODONE  (OXYCONTIN ) 15 mg 12 hr tablet Take 1 tablet (15 mg total) by mouth every 12 (twelve) hours. 02/27/24 03/28/24  [name], NP  Oxycodone  HCl 10 MG TABS Take 1  tablet (10 mg total) by mouth every 4 (four) hours as needed. 02/27/24   [name], NP  sodium bicarbonate  650 MG tablet Take 1,950 mg by mouth 3 (three) times daily. 11/07/18   [provider]  sodium zirconium cyclosilicate  (LOKELMA ) 10 g PACK packet Take 10 g by mouth daily. 04/21/23 04/22/24  [provider]  TYLENOL  8 HOUR ARTHRITIS PAIN 650 MG CR tablet Take 1,300 mg by mouth as needed for pain.    [provider]    Allergies: Nsaids and Morphine     Review of Systems  Updated Vital Signs  Vitals:   03/11/24 1657 03/11/24 1817  BP: 112/75 111/88  Pulse: (!) 106 92  Resp: 20 16  Temp: 98.8 F (37.1 C)   SpO2: 100% 100%     Physical Exam Vitals and nursing note reviewed.  Constitutional:      Appearance: Normal appearance.  HENT:     Head: Normocephalic and atraumatic.  Eyes:     Extraocular Movements: Extraocular movements intact.     Pupils: Pupils are equal, round, and reactive to light.  Cardiovascular:     Rate and Rhythm: Normal rate and regular rhythm.  Pulmonary:     Effort: Pulmonary effort is normal.     Breath sounds: Normal breath sounds.  Musculoskeletal:     Cervical back: Normal range of motion.     Comments: Moves all extremities spontaneously without  difficulty  R ankle: No tenderness or deformity of the medial/lateral malleolus, dorsiflexion of the ankle elicits discomfort in the dorsal aspect of the foot, ROM in-tact  Skin:    General: Skin is warm and dry.  Neurological:     General: No focal deficit present.     Mental Status: He is alert and oriented to person, place, and time.     (all labs ordered are listed, but only abnormal results are displayed) Labs Reviewed  CBC WITH DIFFERENTIAL/PLATELET - Abnormal; Notable for the following components:      Result Value   RBC 1.76 (*)    Hemoglobin 7.8 (*)    HCT 23.0 (*)    MCV 130.7 (*)    MCH 44.3 (*)    RDW 23.4 (*)    nRBC 0.6 (*)    Lymphs Abs 5.6 (*)     All other components within normal limits  RETICULOCYTES - Abnormal; Notable for the following components:   Retic Ct Pct 7.2 (*)    RBC. 1.77 (*)    Immature Retic Fract 28.2 (*)    All other components within normal limits  URINALYSIS, ROUTINE W REFLEX MICROSCOPIC  COMPREHENSIVE METABOLIC PANEL WITH GFR  I-STAT CG4 LACTIC ACID, ED  TROPONIN T, HIGH SENSITIVITY  TROPONIN T, HIGH SENSITIVITY    EKG: EKG Interpretation Date/Time:  Tuesday March 11 2024 17:46:40 EST Ventricular Rate:  96 PR Interval:  192 QRS Duration:  94 QT Interval:  322 QTC Calculation: 407 R Axis:   30  Text Interpretation: Sinus rhythm ST elevation, consider inferior injury diffuse early repolarization seen on previous tracings, no sig change Confirmed by Armenta Canning [phone]) on 03/11/2024 8:57:57 PM  Radiology: [facility] Foot Complete Right Result Date: 03/11/2024 EXAM: 3 OR MORE VIEW(S) XRAY OF THE RIGHT FOOT 03/11/2024 05:37:00 PM COMPARISON: None available. CLINICAL HISTORY: Right ankle pain, sickle cell crisis. FINDINGS: BONES AND JOINTS: Sclerosis is again noted in the talus and tarsal navicular bone. No acute fracture or dislocation is seen. Degenerative changes of the first metatarsophalangeal joint. SOFT TISSUES: Unremarkable. IMPRESSION: 1. Chronic changes related to the underlying sickle cell disease. No acute abnormality noted. Electronically signed by: oneil devonshire MD 03/11/2024 06:18 PM EST RP Workstation: [id number]   DG Ankle 2 Views Right Result Date: 03/11/2024 EXAM: 2 VIEW(S) XRAY OF THE RIGHT ANKLE 03/11/2024 05:37:00 PM CLINICAL HISTORY: Right ankle pain, sickle cell crisis. COMPARISON: None available. FINDINGS: BONES AND JOINTS: Sclerotic changes are noted in the talus and tarsal navicular bone consistent with underlying sickle cell disease. Some height loss in the talus is noted, felt to be chronic in nature. No acute fracture. No malalignment. SOFT TISSUES: No soft tissue abnormality is noted.  IMPRESSION: 1. Sclerotic changes in the talus and tarsal navicular bone consistent with underlying sickle cell disease. 2. Chronic height loss in the talus. Electronically signed by: Oneil Devonshire MD 03/11/2024 06:16 PM EST RP Workstation: [id number]     Procedures   Medications Ordered in the ED  HYDROmorphone  (DILAUDID ) injection 2 mg (2 mg Intravenous Given 03/11/24 2224)  oxyCODONE  (OXYCONTIN ) 12 hr tablet 15 mg (has no administration in time range)  oxyCODONE  (Oxy IR/ROXICODONE ) immediate release tablet 10 mg (has no administration in time range)  hydroxyurea  (HYDREA ) capsule 500-1,000 mg (has no administration in time range)  amitriptyline  (ELAVIL ) tablet 100 mg (has no administration in time range)  calcitRIOL  (ROCALTROL ) capsule 0.25 mcg (has no administration in time range)  sodium bicarbonate  tablet 1,950 mg (has no  administration in time range)  folic acid  (FOLVITE ) tablet 1 mg (has no administration in time range)  Deferiprone  (Twice Daily) TABS 3,000 mg (has no administration in time range)  senna-docusate (Senokot-S) tablet 1 tablet (has no administration in time range)  polyethylene glycol (MIRALAX  / GLYCOLAX ) packet 17 g (has no administration in time range)  0.9 %  sodium chloride  infusion (has no administration in time range)  naloxone  (NARCAN ) injection 0.4 mg (has no administration in time range)    And  sodium chloride  flush (NS) 0.9 % injection 9 mL (has no administration in time range)  ondansetron  (ZOFRAN ) injection 4 mg (has no administration in time range)  diphenhydrAMINE  (BENADRYL ) capsule 25 mg (has no administration in time range)  HYDROmorphone  (DILAUDID ) 1 mg/mL PCA injection (has no administration in time range)  diphenhydrAMINE  (BENADRYL ) 12.5 mg in sodium chloride  0.9 % 50 mL IVPB (0 mg Intravenous Stopped 03/11/24 2004)  HYDROmorphone  (DILAUDID ) injection 2 mg (2 mg Intravenous Given 03/11/24 1931)  HYDROmorphone  (DILAUDID ) injection 2 mg (2 mg Intravenous  Given 03/11/24 2019)  HYDROmorphone  (DILAUDID ) injection 2 mg (2 mg Intravenous Given 03/11/24 2046)  sodium chloride  0.9 % bolus 1,000 mL (1,000 mLs Intravenous New Bag/Given 03/11/24 2122)                                    Medical Decision Making This patient presents to the ED for concern of sickle cell pain, this involves an extensive number of treatment options, and is a complaint that carries with it a high risk of complications and morbidity.  The differential diagnosis includes sickle cell pain, acute chest syndrome, fracture, dislocation, contusion, sprain/strain   Co morbidities that complicate the patient evaluation  Sickle cell disease   Additional history obtained:  Additional history obtained from record review External records from outside source obtained and reviewed including prior ED note and previous hospital discharge summary, previous note from [facility] sickle cell clinic   Lab Tests:  I Ordered, and personally interpreted labs.  The pertinent results include: CBC largely stable from previous, hemoglobin of 7.8, unable to determine platelet count due to clumps on smear.  Reticulocyte percentage elevated at 7.2 with increased immature reticulocyte fraction 28.2.  Lactic normal, 1.3.  CMP notable for borderline hyperkalemia with potassium of 5.2, creatinine of 3.21 is improved from most recent of 4.9 and largely reflective of patient's baseline, elevations in AST/ALT/alk phos similar from previous.   Imaging Studies ordered:  I ordered imaging studies including XR R foot , XR R ankle I independently visualized and interpreted imaging which showed  - XR R foot: 1. Chronic changes related to the underlying sickle cell disease. No acute abnormality noted. - XR R ankle: 1. Sclerotic changes in the talus and tarsal navicular bone consistent with underlying sickle cell disease. 2. Chronic height loss in the talus.  I agree with the radiologist interpretation   Cardiac  Monitoring: / EKG:  The patient was maintained on a cardiac monitor.  I personally viewed and interpreted the cardiac monitored which showed an underlying rhythm of: NSR   Consultations Obtained:  I requested consultation with the hospitalist,  and discussed lab and imaging findings as well as pertinent plan - they recommend: I spoke with Dr. Lou who agrees that this patient is appropriate for admission in the setting of sickle cell pain crisis, plans to monitor potassium for now without intervention   Problem  List / ED Course / Critical interventions / Medication management  I ordered medication including Benadryl /Dilaudid  in accordance with sickle cell pain crisis protocol, IV fluids for rehydration  Reevaluation of the patient after these medicines showed that the patient stayed the same I have reviewed the patients home medicines and have made adjustments as needed   Social Determinants of Health:  Former tobacco use   Test / Admission - Considered:  Physical exam is largely unremarkable as above, patient complaining of pain at the right ankle secondary to a fall earlier today, no bony tenderness on exam however patient does note discomfort in the dorsal aspect of his foot that is elicited by dorsiflexion, otherwise full/intact range of motion. Otherwise, patient notes pain all over, specifically in the back and extremities and that this is similar in presentation to his prior sickle cell pain crises.  He is on oxycodone  at home which he did take several doses of today without relief of his symptoms. X-rays of the right foot/ankle do not show any acute changes as a result of patient's fall on ice today. Despite several doses of Dilaudid , patient does not have any improvement in his pain control.  Patient is concerned that if he is to be discharged he will not be able to manage his pain at home on oxycodone , as he did not have any relief of his symptoms on that medication earlier  today.  I do not feel that admission for pain control is unreasonable in his case, I spoke with the hospitalist who is in agreement with this plan.     Amount and/or Complexity of Data Reviewed Labs: ordered.  Risk Prescription drug management. Decision regarding hospitalization.        Final diagnoses:  Sickle cell pain crisis [facility])  Hyperkalemia    ED Discharge Orders     None          Glendia Rocky LOISE DEVONNA 03/11/24 2235    Armenta Canning, MD 03/15/24 1914  "

## 2024-03-11 NOTE — ED Triage Notes (Addendum)
 Patient c/o sickle cell pain x 2 hours. Patient report taking PRN pain medication without relief. Patient c/o 10/10 right ankle pain. Patient denies chest pain and SOB. Patient denies fever and chills.

## 2024-03-11 NOTE — H&P (Signed)
 " History and Physical  [name] A [employer] FMW:[id number] DOB: [birth year] DOA: 03/11/2024  PCP: [name], NP   Chief Complaint: Sickle cell pain  HPI: [name] [name]. is a 44 y.o. male with medical history significant for sickle cell disease, chronic pain syndrome, opiate dependence and tolerance, anemia of chronic disease, and CKD stage IV who presented to the ED for evaluation of sickle cell pain. Patient reports increased fatigue today with sickle cell pain in his lower legs. States he had multiple falls while walking on the ice today and has worsening right ankle pain.  He denies any headaches, dizziness, shortness of breath, chest pain, back pain, abdominal pain, nausea, vomiting, cough, shortness of breath, fevers or chills.  ED Course: Initial vitals show patient afebrile and normotensive. Initial labs significant for K+ 5.2, creatinine 3.21, alk phos 227, WBC 8.8, Hgb 7.8, reticulocyte count 7.2%. Right foot and ankle x-ray shows chronic underlying sickle cell disease changes but no acute fracture or dislocation. Pt received IV NS 1 L bolus, IV Dilaudid  2 mg x 3 and IV Benadryl . TRH was consulted for admission.   Review of Systems: Please see HPI for pertinent positives and negatives. A complete 10 system review of systems are otherwise negative.  Past Medical History:  Diagnosis Date   Abscess of right iliac fossa 09/24/2018   required Perc Drain 09/24/2018   Arachnoid Cyst of brain bilaterally    2 really small ones in the back of my head; inside; saw them w/MRI (09/25/2012)   Bacterial pneumonia ~ 2012   caught it here in the hospital (09/25/2012)   Chronic kidney disease    from my sickle cell (09/25/2012)   CKD (chronic kidney disease), stage II    Colitis 04/19/2017   CT scan abd/ pelvis   GERD (gastroesophageal reflux disease)    after I eat alot of spicey foods (09/25/2012)   Gynecomastia, male 07/10/2012   History of blood transfusion    always  related to sickle cell crisis (09/25/2012)   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   Noted in noted from Hematology notes at [facility]   Migraines    take RX qd to prevent them (09/25/2012)   Opioid dependence with withdrawal (HCC) 08/30/2012   Renal insufficiency    Sickle cell anemia (HCC)    Sickle cell crisis (HCC) 09/25/2012   Sickle cell nephropathy (HCC) 07/10/2012   Sinus tachycardia    Tachycardia with heart rate 121-140 beats per minute with ambulation 08/04/2016   Past Surgical History:  Procedure Laterality Date   ABCESS DRAINAGE     CHOLECYSTECTOMY  ~ 2012   COLONOSCOPY N/A 04/23/2017   Procedure: COLONOSCOPY;  Surgeon: [name], MD;  Location: [facility];  Service: Endoscopy;  Laterality: N/A;   IR FLUORO GUIDE CV LINE RIGHT  12/17/2016   IR FLUORO GUIDE CV LINE RIGHT  02/07/2021   IR RADIOLOGIST EVAL & MGMT  10/02/2018   IR RADIOLOGIST EVAL & MGMT  10/15/2018   IR REMOVAL TUN CV CATH W/O FL  12/21/2016   IR REMOVAL TUN CV CATH W/O FL  02/11/2021   IR US  GUIDE VASC ACCESS RIGHT  12/17/2016   IR US  GUIDE VASC ACCESS RIGHT  02/07/2021   spleenectomy     Social History:  reports that he has quit smoking. His smoking use included cigarettes. He has never used smokeless tobacco. He reports current alcohol  use. He reports current drug use. Drug: Marijuana.  Allergies[1]  Family History  Problem Relation Age of Onset   Breast cancer Mother      Prior to Admission medications  Medication Sig Start Date End Date Taking? Authorizing Provider  amitriptyline  (ELAVIL ) 50 MG tablet Take 100 mg by mouth at bedtime. 09/20/23   [provider]  calcitRIOL  (ROCALTROL ) 0.25 MCG capsule Take 0.25 mcg by mouth daily with breakfast. 11/16/20   [provider]  Deferiprone , Twice Daily, (FERRIPROX  TWICE-A-DAY) 1000 MG TABS Take 2,000 mg by mouth daily. 05/01/22   Tilford Bertram HERO, FNP  doxycycline  (VIBRA -TABS) 100 MG tablet Take 1 tablet (100 mg total) by  mouth 2 (two) times daily. 01/24/24   [name], [name], MD  folic acid  (FOLVITE ) 1 MG tablet Take 1 tablet (1 mg total) by mouth daily. 01/24/24   [name], [name], MD  hydroxyurea  (HYDREA ) 500 MG capsule Take 1 capsule by mouth in the morning on Sun/Tues/Wed/Thurs/Sat and 2 capsules on Mon/Fri 01/25/23   Nichols, [name], NP  oxyCODONE  (OXYCONTIN ) 15 mg 12 hr tablet Take 1 tablet (15 mg total) by mouth every 12 (twelve) hours. 02/27/24 03/28/24  [name], NP  Oxycodone  HCl 10 MG TABS Take 1 tablet (10 mg total) by mouth every 4 (four) hours as needed. 02/27/24   [name], NP  sodium bicarbonate  650 MG tablet Take 1,950 mg by mouth 3 (three) times daily. 11/07/18   [provider]  sodium zirconium cyclosilicate  (LOKELMA ) 10 g PACK packet Take 10 g by mouth daily. 04/21/23 04/22/24  [provider]  TYLENOL  8 HOUR ARTHRITIS PAIN 650 MG CR tablet Take 1,300 mg by mouth as needed for pain.    [provider]    Physical Exam: BP (!) 142/95 (BP Location: Right Arm)   Pulse 74   Temp 97.8 F (36.6 C)   Resp 18   SpO2 99%  General: Pleasant, well-appearing young man laying in bed. No acute distress. HEENT: [place]/AT. Anicteric sclera CV: RRR. No murmurs, rubs, or gallops. No LE edema Pulmonary: Lungs CTAB. Normal effort. No wheezing or rales. Abdominal: Soft, nontender, nondistended. Normal bowel sounds. MSK: Moderate tenderness to palpation of the right ankle and foot. NVI. Normal ROM Skin: Warm and dry. No obvious rash or lesions. Neuro: A&Ox3. Moves all extremities. Normal sensation to light touch. No focal deficit. Psych: Normal mood and affect          Labs on Admission:  Basic Metabolic Panel: Recent Labs  Lab 03/11/24 1910  NA 139  K 5.2*  CL 105  CO2 23  GLUCOSE 89  BUN 46*  CREATININE 3.21*  CALCIUM  9.1   Liver Function Tests: Recent Labs  Lab 03/11/24 1910  AST 51*  ALT 45*  ALKPHOS 227*  BILITOT 0.6  PROT 7.6  ALBUMIN   3.9   No results for input(s): LIPASE, AMYLASE in the last 168 hours. No results for input(s): AMMONIA in the last 168 hours. CBC: Recent Labs  Lab 03/11/24 1730  WBC 8.8  NEUTROABS 2.6  HGB 7.8*  HCT 23.0*  MCV 130.7*  PLT PLATELET CLUMPS NOTED ON SMEAR, UNABLE TO ESTIMATE   Cardiac Enzymes: No results for input(s): CKTOTAL, CKMB, CKMBINDEX, TROPONINI in the last 168 hours. BNP (last 3 results) No results for input(s): BNP in the last 8760 hours.  ProBNP (last 3 results) No results for input(s): PROBNP in the last 8760 hours.  CBG: No results for input(s): GLUCAP in the last 168 hours.  Radiological Exams on Admission: DG Foot Complete Right  Result Date: 03/11/2024 EXAM: 3 OR MORE VIEW(S) XRAY OF THE RIGHT FOOT 03/11/2024 05:37:00 PM COMPARISON: None available. CLINICAL HISTORY: Right ankle pain, sickle cell crisis. FINDINGS: BONES AND JOINTS: Sclerosis is again noted in the talus and tarsal navicular bone. No acute fracture or dislocation is seen. Degenerative changes of the first metatarsophalangeal joint. SOFT TISSUES: Unremarkable. IMPRESSION: 1. Chronic changes related to the underlying sickle cell disease. No acute abnormality noted. Electronically signed by: oneil devonshire MD 03/11/2024 06:18 PM EST RP Workstation: [id number]   DG Ankle 2 Views Right Result Date: 03/11/2024 EXAM: 2 VIEW(S) XRAY OF THE RIGHT ANKLE 03/11/2024 05:37:00 PM CLINICAL HISTORY: Right ankle pain, sickle cell crisis. COMPARISON: None available. FINDINGS: BONES AND JOINTS: Sclerotic changes are noted in the talus and tarsal navicular bone consistent with underlying sickle cell disease. Some height loss in the talus is noted, felt to be chronic in nature. No acute fracture. No malalignment. SOFT TISSUES: No soft tissue abnormality is noted. IMPRESSION: 1. Sclerotic changes in the talus and tarsal navicular bone consistent with underlying sickle cell disease. 2. Chronic height loss in the  talus. Electronically signed by: Oneil Devonshire MD 03/11/2024 06:16 PM EST RP Workstation: [id number]   My independent interpretation of EKG: EKG shows sinus tachycardia with nonspecific ST changes.  Assessment/Plan Benoit Meech. is a 44 y.o. male with medical history significant for sickle cell disease, chronic pain syndrome, opiate dependence and tolerance, anemia of chronic disease, and CKD stage IV who presented to the ED for evaluation of sickle cell pain and admitted for sickle cell pain crisis  # Sickle cell pain crisis - Presented with 1 day of worsening leg pain after multiple falls while on the ice - Imaging does not show any acute fractures or dislocations - Observation admission for pain control - Start weight-based PCA pump - Benadryl  25 mg PRN for itching - IV NS at 100 cc/h for 1 day - Continue home hydroxyurea , deferiprone  and folic acid   # CKD stage IV # Mild hyperkalemia - Creatinine of 3.21 seems to be stable compared to his recent baseline of 3.0-3.2 - Bicarb stable at 23, K+ slightly elevated 5.2 - Start IV NS at 100 cc/h for 1 day - Continue sodium bicarb and Rocaltrol  - Trend renal function and avoid nephrotoxic agent  # Right ankle pain - Presented with worsening right ankle pain after multiple falls on ice - X-ray of the ankle and foot does not show any acute fractures or dislocation - Likely muscle sprain superimposed on his sickle cell pain crisis - Continue pain control as above - PT consulted for evaluation  # Anemia of chronic disease - Hgb of 7.8 around his baseline of 7-8 - Trend CBC and transfuse for Hgb > 7  # Chronic pain syndrome # Opiate dependence and tolerance - Continue home OxyContin , amitriptyline  and as needed oxycodone    DVT prophylaxis: SCDs    Code Status: Full Code  Consults called: None  Family Communication: No family at bedside  Severity of Illness: The appropriate patient status for this patient is OBSERVATION.  Observation status is judged to be reasonable and necessary in order to provide the required intensity of service to ensure the patient's safety. The patient's presenting symptoms, physical exam findings, and initial radiographic and laboratory data in the context of their medical condition is felt to place them at decreased risk for further clinical deterioration. Furthermore, it is anticipated that the patient will be medically stable for discharge from the hospital  within 2 midnights of admission.   Level of care: Telemetry   I personally spent a total of 75 minutes in the care of the patient today including preparing to see the patient, getting/reviewing separately obtained history, performing a medically appropriate exam/evaluation, placing orders, documenting clinical information in the EHR, and independently interpreting results.   Lou Claretta HERO, MD 03/11/2024, 11:29 PM [facility] Pager: [phone] Isaiah 41:10   If 7PM-7AM, please contact night-coverage [email or web address] Password [email or web address]     [1]  Allergies Allergen Reactions   Nsaids Other (See Comments)    Kidney disease   Morphine  Other (See Comments)    -Really bad headaches   "

## 2024-03-12 ENCOUNTER — Encounter (HOSPITAL_COMMUNITY): Payer: Self-pay | Admitting: Student

## 2024-03-12 LAB — CBC
HCT: 19.1 % — ABNORMAL LOW (ref 39.0–52.0)
Hemoglobin: 6.7 g/dL — CL (ref 13.0–17.0)
MCH: 45.6 pg — ABNORMAL HIGH (ref 26.0–34.0)
MCHC: 35.1 g/dL (ref 30.0–36.0)
MCV: 129.9 fL — ABNORMAL HIGH (ref 80.0–100.0)
Platelets: 276 10*3/uL (ref 150–400)
RBC: 1.47 MIL/uL — ABNORMAL LOW (ref 4.22–5.81)
RDW: 22.5 % — ABNORMAL HIGH (ref 11.5–15.5)
WBC: 7.7 10*3/uL (ref 4.0–10.5)
nRBC: 0.5 % — ABNORMAL HIGH (ref 0.0–0.2)

## 2024-03-12 LAB — PREPARE RBC (CROSSMATCH)

## 2024-03-12 LAB — POTASSIUM: Potassium: 5.3 mmol/L — ABNORMAL HIGH (ref 3.5–5.1)

## 2024-03-12 MED ORDER — HYDROXYUREA 500 MG PO CAPS
500.0000 mg | ORAL_CAPSULE | ORAL | Status: DC
  Administered 2024-03-12: 500 mg via ORAL
  Filled 2024-03-12: qty 1

## 2024-03-12 MED ORDER — SODIUM CHLORIDE 0.9% IV SOLUTION: Freq: Once | INTRAVENOUS | Status: AC

## 2024-03-12 MED ORDER — SODIUM CHLORIDE 0.45 % IV SOLN: INTRAVENOUS | Status: AC

## 2024-03-12 MED ORDER — HYDROXYUREA 500 MG PO CAPS: 500.0000 mg | ORAL_CAPSULE | Freq: Every day | ORAL | Status: DC

## 2024-03-12 NOTE — Plan of Care (Signed)
 Patient is sickle cell patient, taken over by sickle cell team.

## 2024-03-12 NOTE — Plan of Care (Signed)
" °  Problem: Education: Goal: Knowledge of vaso-occlusive preventative measures will improve Outcome: Progressing Goal: Awareness of infection prevention will improve Outcome: Progressing Goal: Awareness of signs and symptoms of anemia will improve Outcome: Progressing Goal: Long-term complications will improve Outcome: Progressing   Problem: Bowel/Gastric: Goal: Gut motility will be maintained Outcome: Progressing   Problem: Self-Care: Goal: Ability to incorporate actions that prevent/reduce pain crisis will improve Outcome: Progressing   "

## 2024-03-12 NOTE — Progress Notes (Cosign Needed)
 Patient ID: [name] [name]., male   DOB: [birth year], 44 y.o.   MRN: [id number] Subjective: [name] [name]. is a 44 y.o. male with medical history significant for sickle cell disease, chronic pain syndrome, opiate dependence and tolerance, anemia of chronic disease, and CKD stage IV who presented to the ED for evaluation of sickle cell pain. Patient reports increased fatigue today with sickle cell pain in his lower legs. States he had multiple falls while walking on the ice today and has worsening right ankle pain.     Patient is reporting improved pain of 6/10 this  morning He denies any headaches, dizziness, shortness of breath, chest pain, back pain, abdominal pain, nausea, vomiting, cough, shortness of breath, fevers or chills.     Objective:  Vital signs in last 24 hours:  Vitals:   03/13/24 0307 03/13/24 0604 03/13/24 0715 03/13/24 1127  BP:  123/82    Pulse:  83    Resp: 14 16 16 14   Temp:  98.2 F (36.8 C)    TempSrc:  Oral    SpO2: 96% 100% 96%   Weight:      Height:        Intake/Output from previous day:   Intake/Output Summary (Last 24 hours) at 03/13/2024 1509 Last data filed at 03/13/2024 0606 Gross per 24 hour  Intake 1555.56 ml  Output 700 ml  Net 855.56 ml    Physical Exam: General: Alert, awake, oriented x3, in no acute distress.  HEENT: [place]/AT PEER[place], EOMI Neck: Trachea midline,  no masses, no thyromegal,y no JVD, no carotid bruit OROPHARYNX:  Moist, No exudate/ erythema/lesions.  Heart: Regular rate and rhythm, without murmurs, rubs, gallops, PMI non-displaced, no heaves or thrills on palpation.  Lungs: Clear to auscultation, no wheezing or rhonchi noted. No increased vocal fremitus resonant to percussion  Abdomen: Soft, nontender, nondistended, positive bowel sounds, no masses no hepatosplenomegaly noted..  Neuro: No focal neurological deficits noted cranial nerves II through XII grossly intact. DTRs 2+ bilaterally upper and lower  extremities. Strength 5 out of 5 in bilateral upper and lower extremities. Musculoskeletal: No warm swelling or erythema around joints, no spinal tenderness noted. Psychiatric: Patient alert and oriented x3, good insight and cognition, good recent to remote recall. Lymph node survey: No cervical axillary or inguinal lymphadenopathy noted.  Lab Results:  Basic Metabolic Panel:    Component Value Date/Time   NA 139 03/11/2024 1910   NA 138 01/24/2024 1131   K 5.3 (H) 03/12/2024 1502   CL 105 03/11/2024 1910   CO2 23 03/11/2024 1910   BUN 46 (H) 03/11/2024 1910   BUN 59 (H) 01/24/2024 1131   CREATININE 3.21 (H) 03/11/2024 1910   CREATININE 1.45 (H) 05/12/2014 1430   GLUCOSE 89 03/11/2024 1910   CALCIUM  9.1 03/11/2024 1910   CBC:    Component Value Date/Time   WBC 8.6 03/13/2024 0612   HGB 8.6 (L) 03/13/2024 0612   HGB 8.6 (L) 01/24/2024 1131   HCT 24.6 (L) 03/13/2024 0612   HCT 25.7 (L) 01/24/2024 1131   PLT 286 03/13/2024 0612   PLT 247 01/24/2024 1131   MCV 113.9 (H) 03/13/2024 0612   MCV 117 (H) 01/24/2024 1131   NEUTROABS 2.6 03/11/2024 1730   NEUTROABS 4.6 01/24/2024 1131   LYMPHSABS 5.6 (H) 03/11/2024 1730   LYMPHSABS 4.5 (H) 01/24/2024 1131   MONOABS 0.5 03/11/2024 1730   EOSABS 0.0 03/11/2024 1730   EOSABS 0.1 01/24/2024 1131   BASOSABS 0.0 03/11/2024  1730   BASOSABS 0.0 01/24/2024 1131    No results found for this or any previous visit (from the past 240 hours).  Studies/Results: DG Foot Complete Right Result Date: 03/11/2024 EXAM: 3 OR MORE VIEW(S) XRAY OF THE RIGHT FOOT 03/11/2024 05:37:00 PM COMPARISON: None available. CLINICAL HISTORY: Right ankle pain, sickle cell crisis. FINDINGS: BONES AND JOINTS: Sclerosis is again noted in the talus and tarsal navicular bone. No acute fracture or dislocation is seen. Degenerative changes of the first metatarsophalangeal joint. SOFT TISSUES: Unremarkable. IMPRESSION: 1. Chronic changes related to the underlying sickle  cell disease. No acute abnormality noted. Electronically signed by: oneil devonshire MD 03/11/2024 06:18 PM EST RP Workstation: [id number]   DG Ankle 2 Views Right Result Date: 03/11/2024 EXAM: 2 VIEW(S) XRAY OF THE RIGHT ANKLE 03/11/2024 05:37:00 PM CLINICAL HISTORY: Right ankle pain, sickle cell crisis. COMPARISON: None available. FINDINGS: BONES AND JOINTS: Sclerotic changes are noted in the talus and tarsal navicular bone consistent with underlying sickle cell disease. Some height loss in the talus is noted, felt to be chronic in nature. No acute fracture. No malalignment. SOFT TISSUES: No soft tissue abnormality is noted. IMPRESSION: 1. Sclerotic changes in the talus and tarsal navicular bone consistent with underlying sickle cell disease. 2. Chronic height loss in the talus. Electronically signed by: Oneil Devonshire MD 03/11/2024 06:16 PM EST RP Workstation: [id number]    Medications: Scheduled Meds:  amitriptyline   100 mg Oral QHS   calcitRIOL   0.25 mcg Oral Q breakfast   darbepoetin (ARANESP ) injection - NON-DIALYSIS  150 mcg Subcutaneous Once   Deferiprone  (Twice Daily)  3,000 mg Oral Daily   folic acid   1 mg Oral Daily   HYDROmorphone    Intravenous Q4H   hydroxyurea   1,000 mg Oral Once per day on Monday Thursday   hydroxyurea   500 mg Oral Once per day on Sunday Tuesday Wednesday Friday Saturday   oxyCODONE   15 mg Oral Q12H   senna-docusate  1 tablet Oral BID   sodium bicarbonate   1,950 mg Oral TID   Continuous Infusions:   PRN Meds:.diphenhydrAMINE , naloxone  **AND** sodium chloride  flush, ondansetron  (ZOFRAN ) IV, oxyCODONE , polyethylene glycol  Consultants: None  Procedures: Blood transfusion   Antibiotics: None  Assessment/Plan: Active Problems:   TOBACCO ABUSE   CKD (chronic kidney disease), stage IV (HCC)   Hyperkalemia   Leukocytosis   Anemia of chronic disease   Sickle cell pain crisis (HCC)   Acute right ankle pain   Hb Sickle Cell Disease with Pain crisis:  Continue IVF 0.45% Saline @KVO , continue weight based Dilaudid  PCA, IV Toradol  is contraindicated for patient due to CKD.  Continue oral home pain medications as ordered. Monitor vitals very closely, Re-evaluate pain scale regularly, 2 L of Oxygen  by [place]. Patient encouraged to ambulate on the hallway today.  Anemia of Chronic Disease: Hemoglobin below patient's baseline at 6.7 g/dL.  Will transfuse 1 unit packed red blood cells.  Repeat CBC in the morning. Chronic pain Syndrome: Continue oral home pain medication. CKD (chronic kidney disease): Trend renal function and avoid nephrotoxic agent  Tobacco Use: smoking cessation encouraged, education provided Hyperkalemia: Give Lokelma  return Monday at the [facility] for repeat labs.    Code Status: Full Code Family Communication: N/A Disposition Plan: Not yet ready for discharge  Homer CHRISTELLA Cover NP  If 7PM-7AM, please contact night-coverage.  03/12/2024, 3:09 PM  LOS: 1 day

## 2024-03-12 NOTE — Progress Notes (Addendum)
 PT Cancellation Note  Patient Details Name: [name] [name]. MRN: [id number] DOB: [birth year]   Cancelled Treatment:    Reason Eval/Treat Not Completed: PT screened, no needs identified, will sign off (Pt reports he's independent with mobility. He was up walking in his room upon my arrival. He reports R ankle pain is much improved, now a 5/10. He declined trial of a cane or crutch and declined further PT as he is independent with walking.)  Sylvan Delon Copp PT 03/12/2024  Acute Rehabilitation Services  Office [phone]

## 2024-03-12 NOTE — Progress Notes (Signed)
 Pts refusing telemetry. Provider notified and care continues.

## 2024-03-12 NOTE — Progress Notes (Signed)
 Blood transfusion completed at 2020. No apparent transfusion reaction noted. Pt remains alert and oriented x4, call light within reach.

## 2024-03-13 LAB — CBC
HCT: 24.6 % — ABNORMAL LOW (ref 39.0–52.0)
Hemoglobin: 8.6 g/dL — ABNORMAL LOW (ref 13.0–17.0)
MCH: 39.8 pg — ABNORMAL HIGH (ref 26.0–34.0)
MCHC: 35 g/dL (ref 30.0–36.0)
MCV: 113.9 fL — ABNORMAL HIGH (ref 80.0–100.0)
Platelets: 286 10*3/uL (ref 150–400)
RBC: 2.16 MIL/uL — ABNORMAL LOW (ref 4.22–5.81)
WBC: 8.6 10*3/uL (ref 4.0–10.5)
nRBC: 0.5 % — ABNORMAL HIGH (ref 0.0–0.2)

## 2024-03-13 LAB — MISC LABCORP TEST (SEND OUT): Labcorp test code: 83935

## 2024-03-13 MED ORDER — SODIUM ZIRCONIUM CYCLOSILICATE 10 G PO PACK
10.0000 g | PACK | Freq: Once | ORAL | Status: AC
  Administered 2024-03-13: 10 g via ORAL
  Filled 2024-03-13: qty 1

## 2024-03-13 NOTE — Discharge Summary (Cosign Needed Addendum)
 Physician Discharge Summary  [name] [name]. FMW:[id number] DOB: 18-Dec-1980 DOA: 03/11/2024  PCP: [name], NP  Admit date: 03/11/2024  Discharge date: 03/14/2024  Discharge Diagnoses:  Active Problems:   TOBACCO ABUSE   CKD (chronic kidney disease), stage IV (HCC)   Hyperkalemia   Leukocytosis   Anemia of chronic disease   Sickle cell pain crisis (HCC)   Acute right ankle pain   Discharge Condition: Stable  Disposition:  Pt is discharged home in good condition and is to follow up with [name], NP this week to have labs evaluated. [name] A Baylock Jr. is instructed to increase activity slowly and balance with rest for the next few days, and use prescribed medication to complete treatment of pain  Diet: Regular Wt Readings from Last 3 Encounters:  03/12/24 59 kg  02/05/24 55.8 kg  01/24/24 57.6 kg    History of present illness:  [name] Uno. is a 44 y.o. male with medical history significant for sickle cell disease, chronic pain syndrome, opiate dependence and tolerance, anemia of chronic disease, and CKD stage IV who presented to the ED for evaluation of sickle cell pain. Patient reported increased fatigue today with sickle cell pain in his lower legs.  Stated he had multiple falls while walking on the ice this past weekend and has worsening right ankle pain.  He denies any headaches, dizziness, shortness of breath, chest pain, back pain, abdominal pain, nausea, vomiting, cough, shortness of breath, fevers or chills.   ED Course: Initial vitals show patient afebrile. labs significant for K+ 5.2, creatinine 3.21, alk phos 227, WBC 8.8, Hgb 7.8, reticulocyte count 7.2%. Right foot and ankle x-ray shows chronic underlying sickle cell disease changes but no acute fracture or dislocation. Pt received IV NS 1 L bolus, IV Dilaudid  2 mg x 3 and IV Benadryl .  Patient's symptoms was unresolved, he was therefore admitted for sickle cell pain crisis  management.    Hospital Course:  Patient was admitted for sickle cell pain crisis and managed appropriately with IVF, IV Dilaudid  via PCA. While on admission hemoglobin dropped below patient's baseline at 6.7 g/dL.  Patient was transfused with 1 unit packed red blood cells, he tolerated transfusion without reactions.  Hemoglobin is improved today at 8.6 g/dL.  He is ambulating without assistance, he reports he is feeling much better and not as weak.  Patient asked to be discharged home, He was therefore discharged home today in a hemodynamically stable condition.   [name] [name] will follow-up with PCP within 1 week of this discharge. [name] [name] was counseled extensively about nonpharmacologic means of pain management, patient verbalized understanding and was appreciative of  the care received during this admission.   We discussed the need for good hydration, monitoring of hydration status, avoidance of heat, cold, stress, and infection triggers. We discussed the need to be adherent with taking his home medications. Patient was reminded of the need to seek medical attention immediately if any symptom of bleeding, anemia, or infection occurs.  Discharge Exam: Vitals:   03/13/24 0715 03/13/24 1127  BP:    Pulse:    Resp: 16 14  Temp:    SpO2: 96%    Vitals:   03/13/24 0307 03/13/24 0604 03/13/24 0715 03/13/24 1127  BP:  123/82    Pulse:  83    Resp: 14 16 16 14   Temp:  98.2 F (36.8 C)    TempSrc:  Oral    SpO2: 96% 100% 96%  Weight:      Height:        General appearance : Awake, alert, not in any distress. Speech Clear. Not toxic looking HEENT: Atraumatic and Normocephalic, pupils equally reactive to light and accomodation Neck: Supple, no JVD. No cervical lymphadenopathy.  Chest: Good air entry bilaterally, no added sounds  CVS: S1 S2 regular, no murmurs.  Abdomen: Bowel sounds present, Non tender and not distended with no gaurding, rigidity or rebound. Extremities: B/L Lower Ext  shows no edema, both legs are warm to touch Neurology: Awake alert, and oriented X 3, CN II-XII intact, Non focal Skin: No Rash  Discharge Instructions  Discharge Instructions     Increase activity slowly   Complete by: As directed       Allergies as of 03/13/2024       Reactions   Nsaids Other (See Comments)   Kidney disease   Morphine  Other (See Comments)   -Really bad headaches        Medication List     TAKE these medications    amitriptyline  50 MG tablet Commonly known as: ELAVIL  Take 100 mg by mouth at bedtime.   calcitRIOL  0.25 MCG capsule Commonly known as: ROCALTROL  Take 0.25 mcg by mouth daily with breakfast.   doxycycline  100 MG tablet Commonly known as: VIBRA -TABS Take 1 tablet (100 mg total) by mouth 2 (two) times daily.   Ferriprox  Twice-A-Day 1000 MG Tabs Generic drug: Deferiprone  (Twice Daily) Take 3,000 mg by mouth daily.   folic acid  1 MG tablet Commonly known as: FOLVITE  Take 1 tablet (1 mg total) by mouth daily.   hydroxyurea  500 MG capsule Commonly known as: HYDREA  Take 1 capsule by mouth in the morning on Sun/Tues/Wed/Thurs/Sat and 2 capsules on Mon/Fri What changed: additional instructions   oxyCODONE  15 mg 12 hr tablet Commonly known as: OXYCONTIN  Take 1 tablet (15 mg total) by mouth every 12 (twelve) hours.   Oxycodone  HCl 10 MG Tabs Take 1 tablet (10 mg total) by mouth every 4 (four) hours as needed.   sodium bicarbonate  650 MG tablet Take 1,950 mg by mouth 3 (three) times daily.   sodium zirconium cyclosilicate  10 g Pack packet Commonly known as: LOKELMA  Take 10 g by mouth daily.   Tylenol  8 Hour Arthritis Pain 650 MG CR tablet Generic drug: acetaminophen  Take 1,300 mg by mouth as needed for pain.        The results of significant diagnostics from this hospitalization (including imaging, microbiology, ancillary and laboratory) are listed below for reference.    Significant Diagnostic Studies: DG Foot Complete  Right Result Date: 03/11/2024 EXAM: 3 OR MORE VIEW(S) XRAY OF THE RIGHT FOOT 03/11/2024 05:37:00 PM COMPARISON: None available. CLINICAL HISTORY: Right ankle pain, sickle cell crisis. FINDINGS: BONES AND JOINTS: Sclerosis is again noted in the talus and tarsal navicular bone. No acute fracture or dislocation is seen. Degenerative changes of the first metatarsophalangeal joint. SOFT TISSUES: Unremarkable. IMPRESSION: 1. Chronic changes related to the underlying sickle cell disease. No acute abnormality noted. Electronically signed by: oneil devonshire MD 03/11/2024 06:18 PM EST RP Workstation: [id number]   DG Ankle 2 Views Right Result Date: 03/11/2024 EXAM: 2 VIEW(S) XRAY OF THE RIGHT ANKLE 03/11/2024 05:37:00 PM CLINICAL HISTORY: Right ankle pain, sickle cell crisis. COMPARISON: None available. FINDINGS: BONES AND JOINTS: Sclerotic changes are noted in the talus and tarsal navicular bone consistent with underlying sickle cell disease. Some height loss in the talus is noted, felt to be chronic in nature. No  acute fracture. No malalignment. SOFT TISSUES: No soft tissue abnormality is noted. IMPRESSION: 1. Sclerotic changes in the talus and tarsal navicular bone consistent with underlying sickle cell disease. 2. Chronic height loss in the talus. Electronically signed by: Oneil Devonshire MD 03/11/2024 06:16 PM EST RP Workstation: [id number]   DG Chest 2 View Result Date: 02/23/2024 CLINICAL DATA:  Shortness of breath, sickle cell. EXAM: CHEST - 2 VIEW COMPARISON:  02/05/2024 and CT chest 02/27/2017. FINDINGS: Trachea is midline. Heart size normal. Lungs are clear. No pleural fluid. Osseous changes of sickle cell, including avascular necrosis of the humeral heads. IMPRESSION: 1. No acute findings. 2. Osseous changes of sickle cell, including avascular necrosis of the humeral heads. Electronically Signed   By: Newell Eke M.D.   On: 02/23/2024 12:05    Microbiology: No results found for this or any previous visit  (from the past 240 hours).   Labs: Basic Metabolic Panel: Recent Labs  Lab 03/11/24 1910 03/12/24 1502  NA 139  --   K 5.2* 5.3*  CL 105  --   CO2 23  --   GLUCOSE 89  --   BUN 46*  --   CREATININE 3.21*  --   CALCIUM  9.1  --    Liver Function Tests: Recent Labs  Lab 03/11/24 1910  AST 51*  ALT 45*  ALKPHOS 227*  BILITOT 0.6  PROT 7.6  ALBUMIN  3.9   No results for input(s): LIPASE, AMYLASE in the last 168 hours. No results for input(s): AMMONIA in the last 168 hours. CBC: Recent Labs  Lab 03/11/24 1730 03/12/24 0638 03/13/24 0612  WBC 8.8 7.7 8.6  NEUTROABS 2.6  --   --   HGB 7.8* 6.7* 8.6*  HCT 23.0* 19.1* 24.6*  MCV 130.7* 129.9* 113.9*  PLT PLATELET CLUMPS NOTED ON SMEAR, UNABLE TO ESTIMATE 276 286   Cardiac Enzymes: No results for input(s): CKTOTAL, CKMB, CKMBINDEX, TROPONINI in the last 168 hours. BNP: Invalid input(s): POCBNP CBG: No results for input(s): GLUCAP in the last 168 hours.  Time coordinating discharge: 50 minutes  Signed:  Homer CHRISTELLA Cover NP  [facility] 03/14/2024, 5:33 PM

## 2024-03-13 NOTE — Plan of Care (Signed)
  Problem: Education: Goal: Knowledge of vaso-occlusive preventative measures will improve Outcome: Completed/Met Goal: Awareness of infection prevention will improve Outcome: Completed/Met Goal: Awareness of signs and symptoms of anemia will improve Outcome: Completed/Met Goal: Long-term complications will improve Outcome: Completed/Met   Problem: Self-Care: Goal: Ability to incorporate actions that prevent/reduce pain crisis will improve Outcome: Completed/Met   Problem: Bowel/Gastric: Goal: Gut motility will be maintained Outcome: Completed/Met   Problem: Tissue Perfusion: Goal: Complications related to inadequate tissue perfusion will be avoided or minimized Outcome: Completed/Met   Problem: Respiratory: Goal: Pulmonary complications will be avoided or minimized Outcome: Completed/Met Goal: Acute Chest Syndrome will be identified early to prevent complications Outcome: Completed/Met   Problem: Fluid Volume: Goal: Ability to maintain a balanced intake and output will improve Outcome: Completed/Met   Problem: Sensory: Goal: Pain level will decrease with appropriate interventions Outcome: Completed/Met   Problem: Health Behavior: Goal: Postive changes in compliance with treatment and prescription regimens will improve Outcome: Completed/Met   Problem: Education: Goal: Knowledge of General Education information will improve Description: Including pain rating scale, medication(s)/side effects and non-pharmacologic comfort measures Outcome: Completed/Met   Problem: Health Behavior/Discharge Planning: Goal: Ability to manage health-related needs will improve Outcome: Completed/Met   Problem: Clinical Measurements: Goal: Ability to maintain clinical measurements within normal limits will improve Outcome: Completed/Met Goal: Will remain free from infection Outcome: Completed/Met Goal: Diagnostic test results will improve Outcome: Completed/Met Goal: Respiratory  complications will improve Outcome: Completed/Met Goal: Cardiovascular complication will be avoided Outcome: Completed/Met   Problem: Activity: Goal: Risk for activity intolerance will decrease Outcome: Completed/Met   Problem: Nutrition: Goal: Adequate nutrition will be maintained Outcome: Completed/Met   Problem: Coping: Goal: Level of anxiety will decrease Outcome: Completed/Met   Problem: Elimination: Goal: Will not experience complications related to bowel motility Outcome: Completed/Met Goal: Will not experience complications related to urinary retention Outcome: Completed/Met   Problem: Pain Managment: Goal: General experience of comfort will improve and/or be controlled Outcome: Completed/Met   Problem: Safety: Goal: Ability to remain free from injury will improve Outcome: Completed/Met   Problem: Skin Integrity: Goal: Risk for impaired skin integrity will decrease Outcome: Completed/Met

## 2024-03-13 NOTE — Plan of Care (Signed)
" °  Problem: Education: Goal: Knowledge of vaso-occlusive preventative measures will improve Outcome: Progressing   Problem: Activity: Goal: Risk for activity intolerance will decrease Outcome: Progressing   Problem: Pain Managment: Goal: General experience of comfort will improve and/or be controlled Outcome: Progressing   Problem: Safety: Goal: Ability to remain free from injury will improve Outcome: Progressing   "

## 2024-03-13 NOTE — Discharge Instructions (Signed)
 SABRA

## 2024-03-14 ENCOUNTER — Telehealth: Payer: Self-pay

## 2024-03-14 LAB — TYPE AND SCREEN
ABO/RH(D): O POS
Antibody Screen: NEGATIVE
Donor AG Type: NEGATIVE
Unit division: 0

## 2024-03-14 LAB — BPAM RBC
Blood Product Expiration Date: 202602252359
ISSUE DATE / TIME: 202601281633
Unit Type and Rh: 5100

## 2024-03-14 NOTE — Transitions of Care (Post Inpatient/ED Visit) (Signed)
" ° °  03/14/2024  Name: [name] [name]. MRN: [id number] DOB: [birth year]  Today's TOC FU Call Status: Today's TOC FU Call Status:: Successful TOC FU Call Completed TOC FU Call Complete Date: 03/14/24  Patient's Name and Date of Birth confirmed. Name, DOB  Transition Care Management Follow-up Telephone Call Date of Discharge: 03/13/24 Discharge Facility: [facility]) Type of Discharge: Inpatient Admission Primary Inpatient Discharge Diagnosis:: sickle cell pain crisis How have you been since you were released from the hospital?: Better Any questions or concerns?: No  Items Reviewed: Did you receive and understand the discharge instructions provided?: Yes Medications obtained,verified, and reconciled?: No Medications Not Reviewed Reasons:: Other: (patient refused to review medications. He stated,  all my medications are the same.) Any new allergies since your discharge?: No Dietary orders reviewed?: Yes Type of Diet Ordered:: regular diet.  Medications Reviewed Today: Medications Reviewed Today   Medications were not reviewed in this encounter   Patient refused to review medications.   Home Care and Equipment/Supplies:Patient refused to complete assessment    Functional Questionnaire:  Patient refused to complete assessment    Follow up appointments reviewed:  Patient refused to complete assessment      Arvin Seip RN, BSN, CCM [place]  [facility], Population Health Case Manager Phone: [phone]  "

## 2024-03-17 ENCOUNTER — Telehealth: Payer: Self-pay | Admitting: Nurse Practitioner

## 2024-03-18 ENCOUNTER — Other Ambulatory Visit: Payer: Self-pay

## 2024-03-18 ENCOUNTER — Other Ambulatory Visit

## 2024-03-18 DIAGNOSIS — D571 Sickle-cell disease without crisis: Secondary | ICD-10-CM

## 2024-03-18 DIAGNOSIS — N184 Chronic kidney disease, stage 4 (severe): Secondary | ICD-10-CM

## 2024-03-18 NOTE — Telephone Encounter (Signed)
 Please advise [facility]

## 2024-03-19 ENCOUNTER — Ambulatory Visit: Payer: Self-pay | Admitting: Nurse Practitioner

## 2024-03-19 LAB — COMPREHENSIVE METABOLIC PANEL WITH GFR
ALT: 49 [IU]/L — ABNORMAL HIGH (ref 0–44)
AST: 67 [IU]/L — ABNORMAL HIGH (ref 0–40)
Albumin: 3.8 g/dL — ABNORMAL LOW (ref 4.1–5.1)
Alkaline Phosphatase: 242 [IU]/L — ABNORMAL HIGH (ref 47–123)
BUN/Creatinine Ratio: 12 (ref 9–20)
BUN: 36 mg/dL — ABNORMAL HIGH (ref 6–24)
Bilirubin Total: 0.4 mg/dL (ref 0.0–1.2)
CO2: 17 mmol/L — ABNORMAL LOW (ref 20–29)
Calcium: 8.6 mg/dL — ABNORMAL LOW (ref 8.7–10.2)
Chloride: 106 mmol/L (ref 96–106)
Creatinine, Ser: 3.13 mg/dL — ABNORMAL HIGH (ref 0.76–1.27)
Globulin, Total: 3.2 g/dL (ref 1.5–4.5)
Glucose: 141 mg/dL — ABNORMAL HIGH (ref 70–99)
Potassium: 5.3 mmol/L — ABNORMAL HIGH (ref 3.5–5.2)
Sodium: 141 mmol/L (ref 134–144)
Total Protein: 7 g/dL (ref 6.0–8.5)
eGFR: 25 mL/min/{1.73_m2} — ABNORMAL LOW

## 2024-03-19 LAB — CBC
Hematocrit: 26.6 % — ABNORMAL LOW (ref 37.5–51.0)
Hemoglobin: 9 g/dL — ABNORMAL LOW (ref 13.0–17.7)
MCH: 40.2 pg — ABNORMAL HIGH (ref 26.6–33.0)
MCHC: 33.8 g/dL (ref 31.5–35.7)
MCV: 119 fL — ABNORMAL HIGH (ref 79–97)
Platelets: 257 10*3/uL (ref 150–450)
RBC: 2.24 x10E6/uL — CL (ref 4.14–5.80)
WBC: 7.5 10*3/uL (ref 3.4–10.8)

## 2024-03-19 LAB — RETICULOCYTES: Retic Ct Pct: 2.3 % (ref 0.6–2.6)

## 2024-03-19 MED ORDER — OXYCODONE HCL ER 15 MG PO T12A: 15.0000 mg | EXTENDED_RELEASE_TABLET | Freq: Two times a day (BID) | ORAL | 0 refills | Status: AC

## 2024-03-19 MED ORDER — OXYCODONE HCL 10 MG PO TABS: 10.0000 mg | ORAL_TABLET | ORAL | 0 refills | Status: AC | PRN

## 2024-04-02 ENCOUNTER — Ambulatory Visit: Payer: Self-pay | Admitting: Nurse Practitioner

## 2024-04-10 ENCOUNTER — Ambulatory Visit: Payer: Self-pay | Admitting: Nurse Practitioner
